# Patient Record
Sex: Female | Born: 1970
Health system: Southern US, Community
[De-identification: ages and names within clinical notes are randomized; demographics above are authoritative.]

## PROBLEM LIST (undated history)

## (undated) DIAGNOSIS — Z9981 Dependence on supplemental oxygen: Secondary | ICD-10-CM

## (undated) DIAGNOSIS — R160 Hepatomegaly, not elsewhere classified: Secondary | ICD-10-CM

## (undated) DIAGNOSIS — G4733 Obstructive sleep apnea (adult) (pediatric): Secondary | ICD-10-CM

## (undated) DIAGNOSIS — I10 Essential (primary) hypertension: Secondary | ICD-10-CM

## (undated) DIAGNOSIS — G43909 Migraine, unspecified, not intractable, without status migrainosus: Secondary | ICD-10-CM

## (undated) DIAGNOSIS — J189 Pneumonia, unspecified organism: Secondary | ICD-10-CM

## (undated) DIAGNOSIS — K219 Gastro-esophageal reflux disease without esophagitis: Secondary | ICD-10-CM

## (undated) DIAGNOSIS — L309 Dermatitis, unspecified: Secondary | ICD-10-CM

## (undated) DIAGNOSIS — G510 Bell's palsy: Secondary | ICD-10-CM

## (undated) DIAGNOSIS — Z8041 Family history of malignant neoplasm of ovary: Secondary | ICD-10-CM

## (undated) DIAGNOSIS — Q513 Bicornate uterus: Secondary | ICD-10-CM

## (undated) DIAGNOSIS — Z803 Family history of malignant neoplasm of breast: Secondary | ICD-10-CM

## (undated) DIAGNOSIS — E669 Obesity, unspecified: Secondary | ICD-10-CM

## (undated) DIAGNOSIS — F419 Anxiety disorder, unspecified: Secondary | ICD-10-CM

## (undated) DIAGNOSIS — M3500 Sicca syndrome, unspecified: Secondary | ICD-10-CM

## (undated) DIAGNOSIS — M329 Systemic lupus erythematosus, unspecified: Secondary | ICD-10-CM

## (undated) DIAGNOSIS — IMO0002 Reserved for concepts with insufficient information to code with codable children: Secondary | ICD-10-CM

## (undated) DIAGNOSIS — N179 Acute kidney failure, unspecified: Secondary | ICD-10-CM

## (undated) DIAGNOSIS — I209 Angina pectoris, unspecified: Secondary | ICD-10-CM

## (undated) DIAGNOSIS — Z8042 Family history of malignant neoplasm of prostate: Secondary | ICD-10-CM

## (undated) DIAGNOSIS — R079 Chest pain, unspecified: Secondary | ICD-10-CM

## (undated) DIAGNOSIS — E1143 Type 2 diabetes mellitus with diabetic autonomic (poly)neuropathy: Secondary | ICD-10-CM

## (undated) DIAGNOSIS — E7800 Pure hypercholesterolemia, unspecified: Secondary | ICD-10-CM

## (undated) DIAGNOSIS — I509 Heart failure, unspecified: Secondary | ICD-10-CM

## (undated) DIAGNOSIS — I639 Cerebral infarction, unspecified: Secondary | ICD-10-CM

## (undated) DIAGNOSIS — K3184 Gastroparesis: Secondary | ICD-10-CM

## (undated) DIAGNOSIS — Z9989 Dependence on other enabling machines and devices: Secondary | ICD-10-CM

## (undated) HISTORY — DX: Bell's palsy: G51.0

## (undated) HISTORY — DX: Sicca syndrome, unspecified: M35.00

## (undated) HISTORY — DX: Family history of malignant neoplasm of breast: Z80.3

## (undated) HISTORY — DX: Family history of malignant neoplasm of ovary: Z80.41

## (undated) HISTORY — PX: MUSCLE BIOPSY: SHX716

## (undated) HISTORY — PX: UMBILICAL HERNIA REPAIR: SHX196

## (undated) HISTORY — PX: HERNIA REPAIR: SHX51

## (undated) HISTORY — DX: Family history of malignant neoplasm of prostate: Z80.42

## (undated) HISTORY — DX: Dermatitis, unspecified: L30.9

---

## 1997-08-01 HISTORY — PX: ECTOPIC PREGNANCY SURGERY: SHX613

## 2003-02-24 ENCOUNTER — Encounter: Payer: Self-pay | Admitting: Cardiology

## 2003-02-24 ENCOUNTER — Encounter: Admission: RE | Admit: 2003-02-24 | Discharge: 2003-02-24 | Payer: Self-pay | Admitting: Cardiology

## 2005-07-18 ENCOUNTER — Emergency Department (HOSPITAL_COMMUNITY): Admission: EM | Admit: 2005-07-18 | Discharge: 2005-07-18 | Payer: Self-pay | Admitting: Emergency Medicine

## 2005-07-21 ENCOUNTER — Ambulatory Visit (HOSPITAL_COMMUNITY): Admission: RE | Admit: 2005-07-21 | Discharge: 2005-07-21 | Payer: Self-pay | Admitting: Obstetrics & Gynecology

## 2005-08-01 HISTORY — PX: BREAST CYST EXCISION: SHX579

## 2005-08-31 ENCOUNTER — Ambulatory Visit (HOSPITAL_BASED_OUTPATIENT_CLINIC_OR_DEPARTMENT_OTHER): Admission: RE | Admit: 2005-08-31 | Discharge: 2005-08-31 | Payer: Self-pay | Admitting: General Surgery

## 2005-08-31 ENCOUNTER — Encounter (INDEPENDENT_AMBULATORY_CARE_PROVIDER_SITE_OTHER): Payer: Self-pay | Admitting: Specialist

## 2007-05-25 ENCOUNTER — Inpatient Hospital Stay (HOSPITAL_COMMUNITY): Admission: EM | Admit: 2007-05-25 | Discharge: 2007-05-28 | Payer: Self-pay | Admitting: Emergency Medicine

## 2008-05-19 ENCOUNTER — Emergency Department (HOSPITAL_COMMUNITY): Admission: EM | Admit: 2008-05-19 | Discharge: 2008-05-19 | Payer: Self-pay | Admitting: Family Medicine

## 2008-11-10 ENCOUNTER — Inpatient Hospital Stay (HOSPITAL_COMMUNITY): Admission: EM | Admit: 2008-11-10 | Discharge: 2008-11-13 | Payer: Self-pay | Admitting: Emergency Medicine

## 2008-11-10 ENCOUNTER — Encounter (INDEPENDENT_AMBULATORY_CARE_PROVIDER_SITE_OTHER): Payer: Self-pay | Admitting: Internal Medicine

## 2008-11-11 ENCOUNTER — Ambulatory Visit: Payer: Self-pay | Admitting: Vascular Surgery

## 2008-11-11 ENCOUNTER — Encounter (INDEPENDENT_AMBULATORY_CARE_PROVIDER_SITE_OTHER): Payer: Self-pay | Admitting: Internal Medicine

## 2009-01-23 ENCOUNTER — Emergency Department (HOSPITAL_COMMUNITY): Admission: EM | Admit: 2009-01-23 | Discharge: 2009-01-23 | Payer: Self-pay | Admitting: Emergency Medicine

## 2009-08-01 DIAGNOSIS — G4733 Obstructive sleep apnea (adult) (pediatric): Secondary | ICD-10-CM

## 2009-08-01 HISTORY — DX: Obstructive sleep apnea (adult) (pediatric): G47.33

## 2009-11-19 ENCOUNTER — Encounter: Admission: RE | Admit: 2009-11-19 | Discharge: 2009-11-19 | Payer: Self-pay | Admitting: Internal Medicine

## 2010-02-02 ENCOUNTER — Encounter: Admission: RE | Admit: 2010-02-02 | Discharge: 2010-02-02 | Payer: Self-pay | Admitting: Internal Medicine

## 2010-03-24 ENCOUNTER — Encounter: Admission: RE | Admit: 2010-03-24 | Discharge: 2010-06-22 | Payer: Self-pay | Admitting: Internal Medicine

## 2010-06-30 ENCOUNTER — Emergency Department (HOSPITAL_COMMUNITY)
Admission: EM | Admit: 2010-06-30 | Discharge: 2010-06-30 | Payer: Self-pay | Source: Home / Self Care | Admitting: Emergency Medicine

## 2010-07-20 ENCOUNTER — Encounter
Admission: RE | Admit: 2010-07-20 | Discharge: 2010-08-31 | Payer: Self-pay | Source: Home / Self Care | Attending: Internal Medicine | Admitting: Internal Medicine

## 2010-07-27 ENCOUNTER — Emergency Department (HOSPITAL_COMMUNITY)
Admission: EM | Admit: 2010-07-27 | Discharge: 2010-07-27 | Payer: Self-pay | Source: Home / Self Care | Admitting: Emergency Medicine

## 2010-08-09 ENCOUNTER — Ambulatory Visit
Admission: RE | Admit: 2010-08-09 | Discharge: 2010-08-09 | Payer: Self-pay | Source: Home / Self Care | Attending: Endocrinology | Admitting: Endocrinology

## 2010-08-09 DIAGNOSIS — M3500 Sicca syndrome, unspecified: Secondary | ICD-10-CM | POA: Insufficient documentation

## 2010-08-09 DIAGNOSIS — M329 Systemic lupus erythematosus, unspecified: Secondary | ICD-10-CM | POA: Insufficient documentation

## 2010-08-09 DIAGNOSIS — G510 Bell's palsy: Secondary | ICD-10-CM | POA: Insufficient documentation

## 2010-08-09 HISTORY — DX: Sjogren syndrome, unspecified: M35.00

## 2010-08-09 HISTORY — DX: Bell's palsy: G51.0

## 2010-09-02 ENCOUNTER — Ambulatory Visit: Admit: 2010-09-02 | Payer: Self-pay | Admitting: Endocrinology

## 2010-09-02 ENCOUNTER — Encounter: Payer: Self-pay | Admitting: Endocrinology

## 2010-09-02 ENCOUNTER — Ambulatory Visit (INDEPENDENT_AMBULATORY_CARE_PROVIDER_SITE_OTHER): Payer: Medicaid Other | Admitting: Endocrinology

## 2010-09-02 DIAGNOSIS — E109 Type 1 diabetes mellitus without complications: Secondary | ICD-10-CM

## 2010-09-02 NOTE — Assessment & Plan Note (Signed)
Summary: NEW ENDO CONSULT/ UNCONT. GLOUCOSE/[facility] ACCESS/   Vital Signs:  Patient profile:   40 year old female Height:      68 inches (172.72 cm) Weight:      366 pounds (166.36 kg) BMI:     55.85 O2 Sat:      95 % on Room air Temp:     98.5 degrees F (36.94 degrees C) oral Pulse rate:   89 / minute Pulse rhythm:   regular BP sitting:   122 / 72  (left arm) Cuff size:   regular  Vitals Entered By: Brenton Grills CMA Duncan Dull) (August 09, 2010 10:58 AM)  O2 Flow:  Room air CC: New Endo Consult/Uncontrolled Diabetes/Dr. Claretha Cooper Jenkins/aj Is Patient Diabetic? Yes   Referring Provider:  Della Goo MD Primary Provider:  Della Goo MD  CC:  New Endo Consult/Uncontrolled Diabetes/Dr. Claretha Cooper Jenkins/aj.  History of Present Illness: pt states 18 months h/o dm.  she is unaware of any chronic complications.  she has been on insulin a approx 1 year.  she takes prednisone for sle, and this was felt to contribute to her dm.   she takes lantus, and prn novolog (she averages 5-10 units of novolog total per day). she brings a record of her cbg's which i have reviewed today.  it varies from 150-400.  it is in general higher later in the day. pt says her diet and exercise are "good."   symptomatically, pt states 18 mos of moderate dryness of the mouth, and associated fatigue  Current Medications (verified): 1)  Metformin Hcl 500 Mg Tabs (Metformin Hcl) .Marland Kitchen.. 1 Tablet By Mouth Two Times A Day 2)  Klor-Con 10 10 Meq Cr-Tabs (Potassium Chloride) .Marland Kitchen.. 1 Tablet By Mouth Once Daily 3)  Alendronate Sodium 70 Mg Tabs (Alendronate Sodium) .Marland Kitchen.. 1 Tablet By Mouth Every Week 4)  Prednisone 10 Mg Tabs (Prednisone) .Marland Kitchen.. 1 1/2 Tablets By Mouth Once Daily (15mg ) 5)  Azathioprine 50 Mg Tabs (Azathioprine) .... 2 Tablets By Mouth Two Times A Day 6)  Hydroxychloroquine Sulfate 200 Mg Tabs (Hydroxychloroquine Sulfate) .Marland Kitchen.. 1 Tablet By Mouth Once Daily 7)  Losartan Potassium 100 Mg Tabs (Losartan  Potassium) .Marland Kitchen.. 1 Tablet By Mouth Once Daily 8)  Furosemide 40 Mg Tabs (Furosemide) .Marland Kitchen.. 1 Tablet By Mouth Every Morning 9)  Atenolol 100 Mg Tabs (Atenolol) .Marland Kitchen.. 1 Tablet By Mouth At Bedtime 10)  Pravastatin Sodium 10 Mg Tabs (Pravastatin Sodium) .Marland Kitchen.. 1 Tablet By Mouth At Bedtime 11)  Lantus 100 Unit/ml Soln (Insulin Glargine) .... 40 Units At Bedtime 12)  Novolog Flexpen 100 Unit/ml Soln (Insulin Aspart) .... Slidling Scale  Allergies (verified): 1)  ! Percocet (Oxycodone-Acetaminophen)  Past History:  Past Medical History: SJOGREN'S SYNDROME (ICD-710.2) BELL'S PALSY (ICD-351.0) SLE (ICD-710.0) IDDM (ICD-250.01) FAMILY HISTORY DIABETES 1ST DEGREE RELATIVE (ICD-V18.0)  Family History: Reviewed history and no changes required. Family History of Arthritis Family History Diabetes: mother Family History Hypertension Family History of Stroke 1st degree relative  Social History: Reviewed history and no changes required. Single Former Smoker Alcohol use-no Drug use-no Regular exercise-yes student Smoking Status:  quit Drug Use:  no Does Patient Exercise:  yes Seat Belt Use:  yes  Review of Systems       no recent hypoglycemia.  she has irreg menses. denies weight change, blurry vision, chest pain, n/v, excessive diaphoresis, rhinorrhea, and easy bruising.  she has ocasional headaches, polyuria, leg cramps, depression, mild difficulty with concentration, and doe.   Physical Exam  General:  morbidly obese.  no distress  Head:  head: no deformity.  there is mild hirsutism on the face eyes: no periorbital swelling, no proptosis external nose and ears are normal mouth: no lesion seen Eyes:  there is bilat proptosis, right > left Neck:  Supple without thyroid enlargement or tenderness.  Lungs:  Clear to auscultation bilaterally. Normal respiratory effort.  Heart:  Regular rate and rhythm without murmurs or gallops noted. Normal S1,S2.   Msk:  muscle bulk and strength are  grossly normal.  no obvious joint swelling.  gait is normal and steady  Pulses:  dorsalis pedis intact bilat.  no carotid bruit  Extremities:  no deformity.  no ulcer on the feet.  feet are of normal color and temp.  no edema mycotic toenails.   Neurologic:  cn 2-12 grossly intact.   readily moves all 4's.   sensation is intact to touch on the feet  Skin:  normal texture and temp.  no rash.  not diaphoretic  Cervical Nodes:  No significant adenopathy.  Psych:  Alert and cooperative; normal mood and affect; normal attention span and concentration.     Impression & Recommendations:  Problem # 1:  IDDM (ICD-250.01) she needs some adjustment in her therapy  Problem # 2:  SLE (ICD-710.0) this raises the probability that she has type 1 dm  Problem # 3:  depression this could hamper the rx of #1, but seems to be mild  Medications Added to Medication List This Visit: 1)  Metformin Hcl 500 Mg Tabs (Metformin hcl) .Marland Kitchen.. 1 tablet by mouth two times a day 2)  Klor-con 10 10 Meq Cr-tabs (Potassium chloride) .Marland Kitchen.. 1 tablet by mouth once daily 3)  Alendronate Sodium 70 Mg Tabs (Alendronate sodium) .Marland Kitchen.. 1 tablet by mouth every week 4)  Prednisone 10 Mg Tabs (Prednisone) .Marland Kitchen.. 1 1/2 tablets by mouth once daily (15mg ) 5)  Azathioprine 50 Mg Tabs (Azathioprine) .... 2 tablets by mouth two times a day 6)  Hydroxychloroquine Sulfate 200 Mg Tabs (Hydroxychloroquine sulfate) .Marland Kitchen.. 1 tablet by mouth once daily 7)  Losartan Potassium 100 Mg Tabs (Losartan potassium) .Marland Kitchen.. 1 tablet by mouth once daily 8)  Furosemide 40 Mg Tabs (Furosemide) .Marland Kitchen.. 1 tablet by mouth every morning 9)  Atenolol 100 Mg Tabs (Atenolol) .Marland Kitchen.. 1 tablet by mouth at bedtime 10)  Pravastatin Sodium 10 Mg Tabs (Pravastatin sodium) .Marland Kitchen.. 1 tablet by mouth at bedtime 11)  Lantus 100 Unit/ml Soln (Insulin glargine) .... 40 units at bedtime 12)  Lantus 100 Unit/ml Soln (Insulin glargine) .... 35 units at bedtime 13)  Novolog Flexpen 100 Unit/ml  Soln (Insulin aspart) .Marland Kitchen.. 10 units three times a day (just before each meal) 14)  Novolog Flexpen 100 Unit/ml Soln (Insulin aspart) .... Slidling scale  Other Orders: New Patient Level IV (47829)  Patient Instructions: 1)  good diet and exercise habits significanly improve the control of your diabetes.  please let me know if you wish to be referred to a dietician.  high blood sugar is very risky to your health.  you should see an eye doctor every year. 2)  controlling your blood pressure and cholesterol drastically reduces the damage diabetes does to your body.  this also applies to quitting smoking.  please discuss these with your doctor.  you should take an aspirin every day, unless you have been advised by a doctor not to. 3)  check your blood sugar 2 times a day.  vary the time of day when you check,  between before the 3 meals, and at bedtime.  also check if you have symptoms of your blood sugar being too high or too low.  please keep a record of the readings and bring it to your next appointment here.  please call us sooner if you are having low blood sugar episodes. 4)  for now, take novolog, no matter what your blood sugar is: 10 units three times a day (just before each meal) 5)  reduce lantus to 35 units at bedtime 6)  Please schedule a follow-up appointment in 2 weeks. 7)  cc lisa j-moore, [place]   Orders Added: 1)  New Patient Level IV [16109]

## 2010-09-07 ENCOUNTER — Encounter: Payer: Medicaid Other | Attending: Internal Medicine | Admitting: *Deleted

## 2010-09-07 DIAGNOSIS — Z713 Dietary counseling and surveillance: Secondary | ICD-10-CM | POA: Insufficient documentation

## 2010-09-08 NOTE — Assessment & Plan Note (Signed)
Summary: 2w f/u  cd   Vital Signs:  Patient profile:   40 year old female Height:      68 inches (172.72 cm) Weight:      360 pounds (163.64 kg) BMI:     54.94 O2 Sat:      96 % on Room air Temp:     98.5 degrees F (36.94 degrees C) oral Pulse rate:   87 / minute Pulse rhythm:   regular BP sitting:   128 / 86  (left arm) Cuff size:   regular  Vitals Entered By: Brenton Grills CMA Duncan Dull) (September 02, 2010 8:05 AM)  O2 Flow:  Room air CC: 2 week F/U/aj Is Patient Diabetic? Yes   Referring Provider:  Della Goo MD Primary Provider:  Della Goo MD  CC:  2 week F/U/aj.  History of Present Illness: pt states she feels well in general.  she brings a record of her cbg's which i have reviewed today.  it is highest at hs (300's), and 200's at all other times cbg's are still over 200.  it is in general higher as the day goes on.    Current Medications (verified): 1)  Metformin Hcl 500 Mg Tabs (Metformin Hcl) .Marland Kitchen.. 1 Tablet By Mouth Two Times A Day 2)  Klor-Con 10 10 Meq Cr-Tabs (Potassium Chloride) .Marland Kitchen.. 1 Tablet By Mouth Once Daily 3)  Alendronate Sodium 70 Mg Tabs (Alendronate Sodium) .Marland Kitchen.. 1 Tablet By Mouth Every Week 4)  Prednisone 10 Mg Tabs (Prednisone) .Marland Kitchen.. 1 1/2 Tablets By Mouth Once Daily (15mg ) 5)  Azathioprine 50 Mg Tabs (Azathioprine) .... 2 Tablets By Mouth Two Times A Day 6)  Hydroxychloroquine Sulfate 200 Mg Tabs (Hydroxychloroquine Sulfate) .Marland Kitchen.. 1 Tablet By Mouth Once Daily 7)  Losartan Potassium 100 Mg Tabs (Losartan Potassium) .Marland Kitchen.. 1 Tablet By Mouth Once Daily 8)  Furosemide 40 Mg Tabs (Furosemide) .Marland Kitchen.. 1 Tablet By Mouth Every Morning 9)  Atenolol 100 Mg Tabs (Atenolol) .Marland Kitchen.. 1 Tablet By Mouth At Bedtime 10)  Pravastatin Sodium 10 Mg Tabs (Pravastatin Sodium) .Marland Kitchen.. 1 Tablet By Mouth At Bedtime 11)  Lantus 100 Unit/ml Soln (Insulin Glargine) .... 35 Units At Bedtime 12)  Novolog Flexpen 100 Unit/ml Soln (Insulin Aspart) .Marland Kitchen.. 10 Units Three Times A Day (Just  Before Each Meal)  Allergies (verified): 1)  ! Percocet (Oxycodone-Acetaminophen)  Past History:  Past Medical History: Last updated: 08/09/2010 SJOGREN'S SYNDROME (ICD-710.2) BELL'S PALSY (ICD-351.0) SLE (ICD-710.0) IDDM (ICD-250.01) FAMILY HISTORY DIABETES 1ST DEGREE RELATIVE (ICD-V18.0)  Review of Systems  The patient denies hypoglycemia.    Physical Exam  General:  morbidly obese.  no distress  Skin:  injection sites at triceps areas are without lesions.     Impression & Recommendations:  Problem # 1:  IDDM (ICD-250.01) needs increased rx  Medications Added to Medication List This Visit: 1)  Lantus 100 Unit/ml Soln (Insulin glargine) .... 25 units at bedtime 2)  Novolog Flexpen 100 Unit/ml Soln (Insulin aspart) .... Three times a day (just before each meal) 15-15-30 units  Other Orders: Est. Patient Level III (16109)  Patient Instructions: 1)  check your blood sugar 2 times a day.  vary the time of day when you check, between before the 3 meals, and at bedtime.  also check if you have symptoms of your blood sugar being too high or too low.  please keep a record of the readings and bring it to your next appointment here.  please call us sooner if you are  having low blood sugar episodes. 2)  increase novolog to three times a day (just before each meal) 15-15-30 units. 3)  reduce lantus to 25 units at bedtime. 4)  Please schedule a follow-up appointment in 2 weeks. Prescriptions: NOVOLOG FLEXPEN 100 UNIT/ML SOLN (INSULIN ASPART) three times a day (just before each meal) 15-15-30 units  #2 boxes x 11   Entered and Authorized by:   Minus Breeding MD   Signed by:   Minus Breeding MD on 09/02/2010   Method used:   Faxed to ...       [facility] (mail-order)       [address]       [place], [place]  [address]       Ph: [phone]       Fax: [phone]   RxID:   [id number]    Orders Added: 1)  Est. Patient Level III [40102]

## 2010-09-16 ENCOUNTER — Ambulatory Visit (INDEPENDENT_AMBULATORY_CARE_PROVIDER_SITE_OTHER): Payer: Medicaid Other | Admitting: Endocrinology

## 2010-09-16 ENCOUNTER — Encounter: Payer: Self-pay | Admitting: Endocrinology

## 2010-09-16 DIAGNOSIS — E109 Type 1 diabetes mellitus without complications: Secondary | ICD-10-CM

## 2010-09-22 NOTE — Assessment & Plan Note (Signed)
Summary: 2 WK FU  STC   Vital Signs:  Patient profile:   40 year old female Height:      68 inches (172.72 cm) Weight:      362.25 pounds (164.66 kg) BMI:     55.28 O2 Sat:      97 % on Room air Temp:     98.7 degrees F (37.06 degrees C) oral Pulse rate:   86 / minute Pulse rhythm:   regular BP sitting:   124 / 72  (left arm) Cuff size:   regular  Vitals Entered By: Brenton Grills CMA Duncan Dull) (September 16, 2010 8:17 AM)  O2 Flow:  Room air CC: 2 week F/U/aj Is Patient Diabetic? Yes   Referring Provider:  Della Goo MD Primary Provider:  Della Goo MD  CC:  2 week F/U/aj.  History of Present Illness: pt states she feels well in general.  she brings a record of her cbg's which i have reviewed today. it varies from 150 (breakfast and lunch) to 200's (supper and hs).  no weight change.    Current Medications (verified): 1)  Metformin Hcl 500 Mg Tabs (Metformin Hcl) .Marland Kitchen.. 1 Tablet By Mouth Two Times A Day 2)  Klor-Con 10 10 Meq Cr-Tabs (Potassium Chloride) .Marland Kitchen.. 1 Tablet By Mouth Once Daily 3)  Alendronate Sodium 70 Mg Tabs (Alendronate Sodium) .Marland Kitchen.. 1 Tablet By Mouth Every Week 4)  Prednisone 10 Mg Tabs (Prednisone) .Marland Kitchen.. 1 1/2 Tablets By Mouth Once Daily (15mg ) 5)  Azathioprine 50 Mg Tabs (Azathioprine) .... 2 Tablets By Mouth Two Times A Day 6)  Hydroxychloroquine Sulfate 200 Mg Tabs (Hydroxychloroquine Sulfate) .Marland Kitchen.. 1 Tablet By Mouth Once Daily 7)  Losartan Potassium 100 Mg Tabs (Losartan Potassium) .Marland Kitchen.. 1 Tablet By Mouth Once Daily 8)  Furosemide 40 Mg Tabs (Furosemide) .Marland Kitchen.. 1 Tablet By Mouth Every Morning 9)  Atenolol 100 Mg Tabs (Atenolol) .Marland Kitchen.. 1 Tablet By Mouth At Bedtime 10)  Pravastatin Sodium 10 Mg Tabs (Pravastatin Sodium) .Marland Kitchen.. 1 Tablet By Mouth At Bedtime 11)  Lantus 100 Unit/ml Soln (Insulin Glargine) .... 25 Units At Bedtime 12)  Novolog Flexpen 100 Unit/ml Soln (Insulin Aspart) .... Three Times A Day (Just Before Each Meal) 15-15-30 Units  Allergies  (verified): 1)  ! Percocet (Oxycodone-Acetaminophen)  Past History:  Past Medical History: Last updated: 08/09/2010 SJOGREN'S SYNDROME (ICD-710.2) BELL'S PALSY (ICD-351.0) SLE (ICD-710.0) IDDM (ICD-250.01) FAMILY HISTORY DIABETES 1ST DEGREE RELATIVE (ICD-V18.0)  Review of Systems  The patient denies hypoglycemia.    Physical Exam  General:  morbidly obese.  no distress  Psych:  Alert and cooperative; normal mood and affect; normal attention span and concentration.     Impression & Recommendations:  Problem # 1:  IDDM (ICD-250.01) the pattern of her cbg's indicates she needs more novolog, and less lantus  Medications Added to Medication List This Visit: 1)  Prednisone 5 Mg Tabs (Prednisone) .... 2 1/2 tabs each am  Other Orders: Est. Patient Level III (44010)  Patient Instructions: 1)  check your blood sugar 2 times a day.  vary the time of day when you check, between before the 3 meals, and at bedtime.  also check if you have symptoms of your blood sugar being too high or too low.  please keep a record of the readings and bring it to your next appointment here.  please call us sooner if you are having low blood sugar episodes. 2)  increase novolog to three times a day (just before each meal) 15-25-40  units. 3)  reduce lantus to 15 units at bedtime. 4)  Please schedule a follow-up appointment in 1 month.   Orders Added: 1)  Est. Patient Level III [16109]

## 2010-09-28 ENCOUNTER — Encounter: Payer: Medicaid Other | Attending: Internal Medicine | Admitting: Dietician

## 2010-09-28 DIAGNOSIS — Z713 Dietary counseling and surveillance: Secondary | ICD-10-CM | POA: Insufficient documentation

## 2010-10-11 LAB — DIFFERENTIAL
Basophils Absolute: 0 10*3/uL (ref 0.0–0.1)
Basophils Relative: 0 % (ref 0–1)
Eosinophils Absolute: 0 10*3/uL (ref 0.0–0.7)
Eosinophils Relative: 0 % (ref 0–5)
Lymphocytes Relative: 12 % (ref 12–46)
Lymphs Abs: 1.4 10*3/uL (ref 0.7–4.0)
Monocytes Absolute: 0.4 10*3/uL (ref 0.1–1.0)
Monocytes Relative: 4 % (ref 3–12)
Neutro Abs: 9.7 10*3/uL — ABNORMAL HIGH (ref 1.7–7.7)
Neutrophils Relative %: 84 % — ABNORMAL HIGH (ref 43–77)

## 2010-10-11 LAB — URINALYSIS, ROUTINE W REFLEX MICROSCOPIC
Bilirubin Urine: NEGATIVE
Glucose, UA: 100 mg/dL — AB
Ketones, ur: NEGATIVE mg/dL
Nitrite: NEGATIVE
Protein, ur: NEGATIVE mg/dL
Specific Gravity, Urine: 1.014 (ref 1.005–1.030)
Urobilinogen, UA: 0.2 mg/dL (ref 0.0–1.0)
pH: 6 (ref 5.0–8.0)

## 2010-10-11 LAB — CBC
HCT: 38.5 % (ref 36.0–46.0)
Hemoglobin: 12.2 g/dL (ref 12.0–15.0)
MCH: 28.2 pg (ref 26.0–34.0)
MCHC: 31.7 g/dL (ref 30.0–36.0)
MCV: 89.1 fL (ref 78.0–100.0)
Platelets: 232 10*3/uL (ref 150–400)
RBC: 4.32 MIL/uL (ref 3.87–5.11)
RDW: 13.1 % (ref 11.5–15.5)
WBC: 11.6 10*3/uL — ABNORMAL HIGH (ref 4.0–10.5)

## 2010-10-11 LAB — URINE MICROSCOPIC-ADD ON

## 2010-10-11 LAB — TYPE AND SCREEN: ABO/RH(D): O POS

## 2010-10-11 LAB — BASIC METABOLIC PANEL
BUN: 11 mg/dL (ref 6–23)
CO2: 26 mEq/L (ref 19–32)
Calcium: 9.1 mg/dL (ref 8.4–10.5)
Chloride: 106 mEq/L (ref 96–112)
Creatinine, Ser: 0.92 mg/dL (ref 0.4–1.2)
GFR calc Af Amer: >60 mL/min (ref >60)
GFR calc non Af Amer: >60 mL/min (ref >60)
Glucose, Bld: 272 mg/dL — ABNORMAL HIGH (ref 70–99)
Potassium: 4.2 mEq/L (ref 3.5–5.1)
Sodium: 139 mEq/L (ref 135–145)

## 2010-10-11 LAB — ABO/RH: ABO/RH(D): O POS

## 2010-10-11 LAB — PROTIME-INR
INR: 0.99 (ref 0.00–1.49)
Prothrombin Time: 13.3 seconds (ref 11.6–15.2)

## 2010-10-11 LAB — OCCULT BLOOD, POC DEVICE: Fecal Occult Bld: POSITIVE

## 2010-10-11 LAB — URINE CULTURE: Culture  Setup Time: 201112280450

## 2010-10-11 LAB — APTT: aPTT: 29 seconds (ref 24–37)

## 2010-10-21 ENCOUNTER — Ambulatory Visit (INDEPENDENT_AMBULATORY_CARE_PROVIDER_SITE_OTHER): Payer: Medicaid Other | Admitting: Endocrinology

## 2010-10-21 ENCOUNTER — Encounter: Payer: Self-pay | Admitting: Endocrinology

## 2010-10-21 DIAGNOSIS — E109 Type 1 diabetes mellitus without complications: Secondary | ICD-10-CM

## 2010-10-21 NOTE — Patient Instructions (Addendum)
increase novolog to three times a day (just before each meal) 20-30-40 units. reduce lantus to 10 units at bedtime. Please schedule a follow-up appointment in 1 month.

## 2010-10-21 NOTE — Progress Notes (Signed)
  Subjective:    Patient ID: [name], female    DOB: September 08, 1970, 40 y.o.   MRN: [id number]  HPI pt states she feels well in general.  she brings a record of her cbg's which i have reviewed today.  It varies from 100-300. There is little or no trend throughout the day, except it is lower in am than at hs.   Past Medical History  Diagnosis Date  . IDDM 08/09/2010  . Bell's palsy 08/09/2010  . SLE 08/09/2010  . SJOGREN'S SYNDROME 08/09/2010   No past surgical history on file.  reports that she quit smoking about 30 years ago. She does not have any smokeless tobacco history on file. She reports that she does not drink alcohol or use illicit drugs. family history includes Arthritis in her other; Diabetes in her mother; Hypertension in her other; and Stroke in her other. Allergies  Allergen Reactions  . Oxycodone-Acetaminophen      Review of Systems Denies hypoglycemia    Objective:   Physical Exam Gen: no distress.  Morbidly obese. Ex: trace bilat ankle edema       Assessment & Plan:  Dm, needs increased rx

## 2010-11-01 ENCOUNTER — Emergency Department (HOSPITAL_COMMUNITY)
Admission: EM | Admit: 2010-11-01 | Discharge: 2010-11-01 | Disposition: A | Discharge status: Home / Self Care | Payer: Medicaid Other | Attending: Emergency Medicine | Admitting: Emergency Medicine

## 2010-11-01 DIAGNOSIS — M436 Torticollis: Secondary | ICD-10-CM | POA: Insufficient documentation

## 2010-11-01 DIAGNOSIS — Z8673 Personal history of transient ischemic attack (TIA), and cerebral infarction without residual deficits: Secondary | ICD-10-CM | POA: Insufficient documentation

## 2010-11-01 DIAGNOSIS — I10 Essential (primary) hypertension: Secondary | ICD-10-CM | POA: Insufficient documentation

## 2010-11-01 DIAGNOSIS — M329 Systemic lupus erythematosus, unspecified: Secondary | ICD-10-CM | POA: Insufficient documentation

## 2010-11-10 LAB — CK TOTAL AND CKMB (NOT AT ARMC)
CK, MB: 1.8 ng/mL (ref 0.3–4.0)
Relative Index: 0.4 (ref 0.0–2.5)
Total CK: 472 U/L — ABNORMAL HIGH (ref 7–177)

## 2010-11-10 LAB — BASIC METABOLIC PANEL
BUN: 8 mg/dL (ref 6–23)
BUN: 8 mg/dL (ref 6–23)
Calcium: 9 mg/dL (ref 8.4–10.5)
GFR calc non Af Amer: >60 mL/min (ref >60)
GFR calc non Af Amer: >60 mL/min (ref >60)
Potassium: 3.7 mEq/L (ref 3.5–5.1)
Potassium: 4 mEq/L (ref 3.5–5.1)
Sodium: 140 mEq/L (ref 135–145)

## 2010-11-10 LAB — GLUCOSE, CAPILLARY
Glucose-Capillary: 103 mg/dL — ABNORMAL HIGH (ref 70–99)
Glucose-Capillary: 105 mg/dL — ABNORMAL HIGH (ref 70–99)
Glucose-Capillary: 132 mg/dL — ABNORMAL HIGH (ref 70–99)
Glucose-Capillary: 133 mg/dL — ABNORMAL HIGH (ref 70–99)

## 2010-11-10 LAB — HEMOGLOBIN A1C: Hgb A1c MFr Bld: 6.4 % — ABNORMAL HIGH (ref 4.6–6.1)

## 2010-11-10 LAB — CBC
HCT: 37.4 % (ref 36.0–46.0)
HCT: 37.5 % (ref 36.0–46.0)
MCHC: 33.4 g/dL (ref 30.0–36.0)
Platelets: 249 10*3/uL (ref 150–400)
Platelets: 251 10*3/uL (ref 150–400)
RDW: 14.3 % (ref 11.5–15.5)
WBC: 9.2 10*3/uL (ref 4.0–10.5)

## 2010-11-10 LAB — CARDIAC PANEL(CRET KIN+CKTOT+MB+TROPI)
CK, MB: 1.5 ng/mL (ref 0.3–4.0)
Relative Index: 0.4 (ref 0.0–2.5)
Troponin I: <0.01 ng/mL (ref 0.00–0.06)

## 2010-11-10 LAB — HOMOCYSTEINE: Homocysteine: 9.7 umol/L (ref 4.0–15.4)

## 2010-11-10 LAB — DIFFERENTIAL
Eosinophils Relative: 1 % (ref 0–5)
Lymphocytes Relative: 26 % (ref 12–46)
Lymphs Abs: 2.4 10*3/uL (ref 0.7–4.0)
Neutro Abs: 6 10*3/uL (ref 1.7–7.7)
Neutrophils Relative %: 65 % (ref 43–77)

## 2010-11-10 LAB — SEDIMENTATION RATE: Sed Rate: 30 mm/hr — ABNORMAL HIGH (ref 0–22)

## 2010-11-10 LAB — C3 COMPLEMENT: C3 Complement: 195 mg/dL (ref 88–201)

## 2010-11-10 LAB — TROPONIN I: Troponin I: 0.01 ng/mL (ref 0.00–0.06)

## 2010-11-10 LAB — TSH: TSH: 1.697 u[IU]/mL (ref 0.350–4.500)

## 2010-11-10 LAB — B. BURGDORFI ANTIBODIES: B burgdorferi Ab IgG+IgM: 0.12 {ISR}

## 2010-11-25 ENCOUNTER — Encounter: Payer: Self-pay | Admitting: Endocrinology

## 2010-11-25 ENCOUNTER — Ambulatory Visit (INDEPENDENT_AMBULATORY_CARE_PROVIDER_SITE_OTHER): Payer: Medicaid Other | Admitting: Endocrinology

## 2010-11-25 ENCOUNTER — Other Ambulatory Visit (INDEPENDENT_AMBULATORY_CARE_PROVIDER_SITE_OTHER): Payer: Medicaid Other

## 2010-11-25 ENCOUNTER — Encounter: Payer: Medicaid Other | Admitting: *Deleted

## 2010-11-25 VITALS — BP 124/82 | HR 93 | Temp 98.3°F | Ht 68.0 in | Wt 372.0 lb

## 2010-11-25 DIAGNOSIS — E119 Type 2 diabetes mellitus without complications: Secondary | ICD-10-CM

## 2010-11-25 DIAGNOSIS — E109 Type 1 diabetes mellitus without complications: Secondary | ICD-10-CM

## 2010-11-25 DIAGNOSIS — Z713 Dietary counseling and surveillance: Secondary | ICD-10-CM | POA: Insufficient documentation

## 2010-11-25 LAB — HEMOGLOBIN A1C: Hgb A1c MFr Bld: 8.3 % — ABNORMAL HIGH (ref 4.6–6.5)

## 2010-11-25 NOTE — Progress Notes (Signed)
  Subjective:    Patient ID: [name], female    DOB: Feb 28, 1971, 40 y.o.   MRN: [id number]  HPI no cbg record, but states cbg's are improved.  It is now seldom over 200.  It is highest before lunch, and lowest in am.  pt states she feels well in general. Past Medical History  Diagnosis Date  . IDDM 08/09/2010  . Bell's palsy 08/09/2010  . SLE 08/09/2010  . SJOGREN'S SYNDROME 08/09/2010   No past surgical history on file.  reports that she quit smoking about 30 years ago. She does not have any smokeless tobacco history on file. She reports that she does not drink alcohol or use illicit drugs. family history includes Arthritis in her other; Diabetes in her mother; Hypertension in her other; and Stroke in her other. Allergies  Allergen Reactions  . Oxycodone-Acetaminophen    Review of Systems denies hypoglycemia    Objective:   Physical Exam Pulses:  dorsalis pedis intact bilat.  no carotid bruit Extremities:  no deformity.  no ulcer on the feet.  feet are of normal color and temp.  no edema mycotic toenails.   Neurologic:  cn 2-12 grossly intact.   readily moves all 4's.   sensation is intact to touch on the feet    Lab Results  Component Value Date   HGBA1C 8.3* 11/25/2010      Assessment & Plan:  Dm, needs increased rx.  The pattern of her cbg's indicates the need for less lantus, and more humalog

## 2010-11-25 NOTE — Patient Instructions (Addendum)
blood tests are being ordered for you today.  please call [phone] to hear your test results. pending the test results, please increase novolog to three times a day (just before each meal) 25-30-45 units. stop lantus. Please make a follow-up appointment in 3 months (update: i left message on phone-tree:  rx as we discussed)

## 2010-11-29 ENCOUNTER — Encounter: Payer: Medicaid Other | Attending: Internal Medicine | Admitting: *Deleted

## 2010-11-29 DIAGNOSIS — Z713 Dietary counseling and surveillance: Secondary | ICD-10-CM | POA: Insufficient documentation

## 2010-12-14 NOTE — H&P (Signed)
[name], [name]                 ACCOUNT NO.:  [id number]   MEDICAL RECORD NO.:  [id number]          PATIENT TYPE:  EMS   LOCATION:  [facility]                         FACILITY:  [facility]   PHYSICIAN:  Beckey Rutter, MD  DATE OF BIRTH:  [birth year]   DATE OF ADMISSION:  05/25/2007  DATE OF DISCHARGE:                              HISTORY & PHYSICAL   PRIMARY CARE PHYSICIAN:  Della Goo, M.D.   CHIEF COMPLAINT:  Chest pain.   HISTORY OF PRESENT ILLNESS:  This is a 40 year old female with past  medical history significant for lupus and hypertension who presented  today with chills, cough and right-sided chest pain.  The patient stated  that she felt the chest pain mainly in the right side that is sharp in  nature and prevented her from taking a deep breath and 10/10 stand  sometimes with no radiation and no known aggravating or relieving  factors.  The patient denied similar pain like this before and denied  coronary artery disease or heart disease.  The patient admits to having  chills and feeling cold after her presentation in the emergency room  here.  The patient stated that she has a cough productive to yellowish  sputum and the subjective feeling of fever and chills.   PAST MEDICAL HISTORY:  1. Hypertension.  2. Lupus.   SOCIAL HISTORY:  No drug abuse, not a drinker, no smoking.   FAMILY HISTORY:  Significant for diabetes from the mother's side.   MEDICATION ALLERGIES:  PERCOCET as documented here without allergic  reaction specified.   MEDICATIONS:  1. Hydrocodone.  2. Acetaminophen.  3. Allegra.  4. Claritin-D 12.  5. Prevacid.   REVIEW OF SYSTEMS:  The patient admits to having chills and feeling very  cold. The rest of her review of systems is as per HPI.   PHYSICAL EXAMINATION:  Temperature 98.9, blood pressure 162/95, pulse  88, respiratory rate is 23.  GENERAL:  The patient was lying flat on bed not in acute respiratory  disease distress.  HEAD:   Atraumatic, normocephalic.  EYES:  PERRL. Mouth moist, no ulcer.  NECK:  Supple.  No JVD.  LUNG: Obese with distal breathing sounds.  The patient has area of  tenderness in the anterior right part of her chest. The patient is very  jumpy with even light touch of the stethoscope in that area.  PRECORDIUM:  Distal heart sounds.  ABDOMEN:  Obese, nontender.  EXTREMITIES:  No lower extremity edema.  NEUROLOGIC:  Alert and oriented x3, moving all her extremities  spontaneously.   Her EKG is showing T-wave inversion on the lateral leads starting from  V3, V4, V5 and V6. It is sinus rhythm with ventricular rate of 99 beats  per minutes.  No previous EKG to compare. Her x-ray is showing  bronchitic changes. Troponin is 0.01, CK-MB is 1.7 with a relative index  0.4.  Sodium 140, potassium 3.4, chloride 107, carbon dioxide 25,  glucose 104, BUN 5, creatinine 0.89, calcium 9, estimated GFR is 60.  White blood count 8.5, hemoglobin is 12.3, hematocrit  is 37.2 and  platelet count is 271.  D-dimer is 0.31.   ASSESSMENT AND PLAN:  This is a 40 year old female who came with chest  pain likely secondary to the bronchitic activity and the pain is  secondary to the musculoskeletal stretch secondary to cough but because  the patient has EKG abnormalities, I will admit the patient to rule out  acute coronary syndrome.  1. The patient will be admitted to telemetry floor for further      assessment and management.  2. The patient will be ruled out for acute coronary syndromes and      myocardial infarction by serial cardiac enzymes and serial EKGs.  3. For the bronchitic change cough which is productive, I will start      the patient on Avelox antibiotic and oxygen and cough suppressant.      I will also consider pain medication to relieve the musculoskeletal      change.  4. For the history of lupus, I will check the ESR and try to obtain      the records from Dr. Lovell Sheehan, her primary physician.  5.  For hypertension, we will monitor during this hospitalization.  6. For DVT prophylaxis, I will consider Lovenox and for GI prophylaxis      I will start Protonix.      Beckey Rutter, MD  Electronically Signed     EME/MEDQ  D:  05/25/2007  T:  05/26/2007  Job:  [id number]

## 2010-12-14 NOTE — Consult Note (Signed)
[name], [name]                 ACCOUNT NO.:  [id number]   MEDICAL RECORD NO.:  [id number]          PATIENT TYPE:  INP   LOCATION:  3029                         FACILITY:  [facility]   PHYSICIAN:  Marlan Palau, M.D.  DATE OF BIRTH:  22-Aug-1970   DATE OF CONSULTATION:  11/10/2008  DATE OF DISCHARGE:                                 CONSULTATION   HISTORY OF PRESENT ILLNESS:  [name] is a 40 year old right-handed  black female, born on 02/19/1971, with a history of lupus and  Sjogren syndrome and polymyositis and hypertension.  This patient  presents with onset of left-sided arm and leg weakness and tingling and  right facial droop that was first noticed around 3 a.m.  The patient was  last seen normal around 11 p.m. when she went to bed.  The patient  awakened with stiffness of the left arm and tingly sensations at 3 a.m.  The patient went back to sleep, woke up at 7 a.m. with persistent  symptoms.  The patient comes to the emergency room for an evaluation.  A  CAT scan of the brain was done and shows no acute changes.  There are  changes of sinusitis.  Neurology is asked to see this patient for  further evaluation.  The patient does report a headache over the last  week or so.  It is bifrontal and goes to the vertex of the head.  Headache has persisted.   PAST MEDICAL HISTORY:  Significant for:  1. New onset of left-sided weakness, numbness, and right facial droop.  2. Functional examination.  3. Obesity.  4. Tubal pregnancy.  5. Left breast cyst resection.  6. Lupus.  7. Hypertension.  8. Sjogren syndrome.  9. Polymyositis.   MEDICATIONS:  1. Prednisone 7 mg daily.  2. Atenolol 50 mg daily.  3. Diovan 160 mg daily.  4. Hydroxychloroquine 200 mg twice daily.  5. Vitamin D 50,000 units once a week.  6. Imuran 50 mg 3 daily.  7. Actonel 150 mg once a week.   The patient states an allergy to [facility].   Does not currently smoke or drink.  Denies use of illicit  drugs such as  cocaine or marijuana.   FAMILY MEDICAL HISTORY:  Notable that father died with aneurysm.  Mother  still living, has diabetes and hypertension.  Mother's siblings have  cancer.  The patient has 3 brothers and 4 sisters, all alive and well.   SOCIAL HISTORY:  The patient is single[place] is currently in school, has 1 child, it does not work.   REVIEW OF SYSTEMS:  Notable for no recent fevers.  The patient had some  chills in the last week.  The patient has had headache over the last  week.  The patient denies any double vision or loss of vision, has had  some blurred vision.  The patient does note some shortness of breath,  dyspnea on exertion, chest pain on exertion, some nausea, and has had  some recent troubles controlling the bladder.  Denies  any problems  controlling the bowels, has had left-sided weakness, some dizziness, and  numbness on the left side.   PHYSICAL EXAMINATION:  VITAL SIGNS:  Blood pressure is 140/84, heart  rate 94, respiratory rate 20, and temperature 99.2.  GENERAL:  This patient is a moderately to markedly obese black female,  who is alert and cooperative at the time of examination.  HEENT:  Head is atraumatic.  Eyes, pupils are round and react to light.  Discs are flat bilaterally.  Extraocular movements are full.  Visual  fields are full.  Speech is slightly dysarthric, not aphasic.  NECK:  Supple.  No carotid bruits noted.  RESPIRATORY:  Clear.  CARDIOVASCULAR:  Regular rate and rhythm.  No obvious murmurs or rubs  noted.  EXTREMITIES:  Trace edema of the ankles bilaterally.  NEUROLOGIC:  Cranial nerves as above.  Facial symmetry is not present.  The patient has an examination consistent with pulling the side of the  face to the left with squinting of the left eye, flattening of the right  nasolabial fold.  The patient reports decreased pinprick sensation on  the right as compared to the left, but  decreased vibratory sensation on  the left as compared to the right, splits midline with vibratory  sensation.  Tongue protrudes to the midline.  The patient will extinct  with double simultaneous stimulation on the arms, legs, and face.  Motor  test reveals some giveaway weakness on the left arm and left leg.  No  true weakness is seen.  However, the patient does have some drift on the  left arm and left leg compared to the right.  The patient has no ataxia,  finger-nose-finger, and heel-to-shin.  Gait was not tested.  Deep tendon  reflex reveals slight increase in the right biceps reflex compared to  the left.  Otherwise, reflex in the legs were depressed, but symmetric.  Toes are neutral bilaterally.   CT scan of the brain is as above.  NIH stroke scale score is 8.   LABORATORY VALUES:  Notable for white count 9.2, hemoglobin 12.2,  hematocrit 37.4, MCV of 84.7, and platelets are 251.  Sodium 138,  potassium 3.7, chloride 107, CO2 of 25, glucose of 130, BUN of 8,  creatinine of 0.86, and calcium of 9.0.   Chemistries revealed sodium 138, potassium 3.7, chloride of 107, CO2 of  25, glucose 130, BUN of 8, and creatinine of 0.86.  CK is 472 and  troponin I 0.01.   IMPRESSION:  1. New onset of left hemiparesis, left hemisensory deficit.  2. Functional examination.  3. Polymyositis.  4. Obesity.  5. Hypertension.   This patient does have risk factors for stroke.  The patient has a  history of lupus.  Clinical examination, however, today shows features  that are most consistent with voluntary pulling of the left face to the  left and the patient splits midline with vibratory sensation, need to be  concerned that the patient may be embellishing a true deficit or may be  having a hysterical conversion reaction.  Further workup as indicated.   PLAN:  1. The patient will be admitted to [facility].  2. MRI of the brain.  3. MRI angiogram of the intracranial and extracranial  vessels.  4. Bedside swallow study has been done and the patient did not pass      the study.  5. Speech Therapy would be evaluating this patient and will get an  a.m. lipid panel.  The patient is not a t-PA candidate due to      duration of symptoms.       Marlan Palau, M.D.  Electronically Signed     CKW/MEDQ  D:  11/10/2008  T:  11/11/2008  Job:  [id number]   cc:   [facility]

## 2010-12-14 NOTE — H&P (Signed)
[name], [name]                 ACCOUNT NO.:  [id number]   MEDICAL RECORD NO.:  [id number]          PATIENT TYPE:  INP   LOCATION:  1825                         FACILITY:  [facility]   PHYSICIAN:  Eduard Clos, MDDATE OF BIRTH:  05-28-1971   DATE OF ADMISSION:  11/10/2008  DATE OF DISCHARGE:                              HISTORY & PHYSICAL   PRIMARY CARE Dallis Czaja:  Della Goo, M.D.   CHIEF COMPLAINT:  Left facial numbness and left upper and lower  extremity weakness.   HISTORY OF PRESENT ILLNESS:  A 40 year old female with a known history  of lupus, hypertension, Sjogren's syndrome who presented to the ER with  complaints of left facial numbness, drooping, and left upper and lower  extremity weakness that started when she noticed when she woke up in the  morning around 7 a.m.  Since then, her weakness has been persistent, did  not improve, and the facial numbness also has persisted.  She also  initially had some chest pain, which was retrosternal, and shooting in  nature, nonradiating, lasts a few seconds and recurs.  The patient in  the ER was evaluated with a CT of the head, which did not show any acute  findings, has been admitted for further evaluation and management.  As  the patient's symptoms have occurred when they woke up and onset of time  is not known, the patient is not a candidate for tPA.  The patient  already had a bedside swallow evaluation, which she has failed.  Will be  getting a formal speech evaluation.   At this time, the patient denies any chest pain, but she lists chest  pain off and on since morning, which lasts a few seconds, comes every 15-  20 minutes.  EKG shows sinus rhythm with T wave changes, which are  comparable to the EKG done in October 2008.  The patient denies any  shortness of breath.  Denies any abdominal pain, nausea or vomiting,  diarrhea, fevers or chills, headache, visual symptoms, dysuria,  discharge.   PAST MEDICAL HISTORY:   Hypertension.  Previous history of Bell's palsy  on the left side, which did not fully recover.  Lupus.  Sjogren's  syndrome.   PAST SURGICAL HISTORY:  Tubal ligation and biopsies of muscles for her  lupus.   MEDICATION PRIOR TO ADMISSION:  1. Hydroxychloroquine 200 mg p.o. b.i.d.  2. Vitamin D 50,000 units p.o. weekly.  3. Azathioprine 50 mg 3 tablets p.o. daily.  4. Prednisone 5 mg p.o. daily.  5. Actanol 150 mg p.o. monthly.  6. Diovan 100 mg p.o. daily.  7. Atenolol 50 mg p.o. daily.   ALLERGIES:  PERCOCET.   FAMILY HISTORY:  Nothing contributory.   SOCIAL HISTORY:  The patient lives with her son.  Denies smoking  cigarettes or drinking alcohol, using illegal drugs.   REVIEW OF SYSTEMS:  As per history of present illness.  Nothing else  significant.   PHYSICAL EXAMINATION:  CONSTITUTIONAL:  The patient examined at bedside.  Not in acute distress.  VITAL SIGNS:  Blood pressure is 159/90, pulse  96 per minute, temperature  98.3, respirations 18, O2 saturation 100%.  HEENT:  There is no movement on the right side of the face.  She is able  to close the right eyelid.  On the left side, all muscles are pulled  over to the left side.  She is able to move the facial muscles on the  left side.  PERLA positive.  Tongue is midline.  NECK:  No neck rigidity.  CHEST:  Bilateral air entry present.  No rhonchi.  No crepitus.  HEART:  S1, S2 heard.  ABDOMEN:  Soft, nontender, bowel sounds heard.  CNS:  The patient is alert and oriented to time place and person.  Moves  the right upper and lower extremities 5/5.  Left, there is decreased  gripping.  There is pronator drift on the left side on the upper  extremity.  Strength is 4/5 in the left upper extremity and 4/5 in the  left lower extremity.  There is no tardive dyskinesia or ataxia.  Sensation grossly intact.  EXTREMITIES:  Peripheral pulses felt.  No edema.   LABS:  CT of the head with no acute or focal intracranial  abnormality  visible at this time.  Changes of chronic sinusitis.  Chest x-ray shows  low lung volumes with probable vascular crowding and mild bibasilar  atelectasis.  CBC, WBC is 9.2, hemoglobin 12.4, hematocrit 37.4,  platelets 251,000.  Neutrophils 50%.  Basic metabolic panel, sodium 138,  potassium 3.7, chloride 107, carbon dioxide 25, glucose 130, BUN 8,  creatinine 0.8, calcium 9.  EKG normal sinus rhythm.  Comparable to the  old EKG with T wave changes seen October 2008.   ASSESSMENT:  1. Cerebrovascular accident with left-sided hemiparesis.  Time of      onset not known.  2. Atypical chest pain.  3. History of lupus and Sjogren's syndrome.  4. Morbid obesity.  5. History of hypertension.   PLAN:  Will admit the patient to telemetry unit.  Place the patient on  neuro checks.  Get an MRI/MRA of the brain, 2D echocardiogram,  transcranial Dopplers, sedimentation rate, C-reactive protein, T3 and T4  levels.  Place the patient on aspirin per rectal, get a formal swallow  evaluation.  At this time, keep n.p.o. as the patient has failed swallow  evaluation and will get a neuro evaluation and PT/OT consult, and  further recommendations as condition evolves.      Eduard Clos, MD  Electronically Signed     ANK/MEDQ  D:  11/10/2008  T:  11/10/2008  Job:  [id number]   cc:   Della Goo, M.D.

## 2010-12-14 NOTE — Discharge Summary (Signed)
NAMECARMESHA, [name]                 ACCOUNT NO.:  [id number]   MEDICAL RECORD NO.:  [id number]          PATIENT TYPE:  INP   LOCATION:  3029                         FACILITY:  [facility]   PHYSICIAN:  Michelene Gardener, MD    DATE OF BIRTH:  1971-03-20   DATE OF ADMISSION:  11/10/2008  DATE OF DISCHARGE:  11/13/2008                               DISCHARGE SUMMARY   DISCHARGE DIAGNOSES:  1. Bell palsy.  2. Left-sided weakness.  3. Hypertension.  4. Systemic lupus erythematosus.  5. Guillain-Barre syndrome.  6. Obesity.   DISCHARGE MEDICATIONS:  1. Hydroxychloroquine 200 mg p.o. twice daily.  2. Vitamin D 50,000 units p.o. once weekly.  3. Azathioprine 50 mg 3 tablets p.o. once daily.  4. Prednisone 5 mg once a day.  5. Actonol 150 mg p.o. once daily.  6. Diovan 100 mg once a day.  7. Atenolol 50 mg once a day.   CONSULTATIONS:  1. Neuro consult by Dr. Pearlean Brownie.   PROCEDURES:  None.   DIAGNOSTIC STUDIES:  1. Chest x-ray on November 10, 2008, showed mild atelectasis with no      acute findings.  2. CT scan of the head without contrast on November 10, 2008, showed no      acute or focal intracranial abnormalities.  Open MRI showed no      acute stroke.  Echocardiogram showed ejection fraction of 45-50%      with no evidence of wall motion abnormalities, and there is no      embolic source for stroke.  3. Carotid Doppler showed no evidence of hemodynamically significant      stenosis.  Followup with primary doctor within a week.   COURSE OF HOSPITALIZATION:  1. Non organic Left-sided weakness.  This patient presented with left-      sided weakness which is questionable for stroke at the time of      admission.  The patient was admitted to the Stroke Unit.  CT scan      of the head was done without contrast at the time of admission and      came negative for acute stroke.  MRI of the brain was done as an      open MRI because of her weight and that again came to be negative.  Echocardiogram was done and showed no evidence of embolic source      for stroke.  Carotid Doppler was done and showed no significant      hemodynamic stenosis.  The patient was evaluated by Dr. Pearlean Brownie in      the hospital and he thinks this patient does not have a stroke and      her symptoms are only secondary to Bell palsy.  No further workup      is recommended at this time.  PT and OT and Swallow and Speech      evaluated the patient during the hospital and the patient will be      set up for outpatient physical therapy.  At the time of discharge,      the  patient is still having weakness in the left side of her face      which is consistent with her Bell palsy with minimal left-sided      weakness.  2. Bell palsy.  Again as mentioned, workup has been negative and no      further diagnostic workup or treatment is recommended by Neurology.  3. Hypertension.  Blood pressure has been controlled.  Same      medications were continued.   Otherwise, other medical conditions remained stable during this  hospitalization.  The patient will be discharged today in stable  condition.  She still had facial symptoms consistent with facial palsy.   Assessment time is 40 minutes.       Michelene Gardener, MD  Electronically Signed     NAE/MEDQ  D:  11/13/2008  T:  11/14/2008  Job:  [id number]   cc:   Della Goo, M.D.

## 2010-12-14 NOTE — Discharge Summary (Signed)
[name], [name]                 ACCOUNT NO.:  [id number]   MEDICAL RECORD NO.:  [id number]          PATIENT TYPE:  INP   LOCATION:  3017                         FACILITY:  [facility]   PHYSICIAN:  Wilson Singer, M.D.DATE OF BIRTH:  [birth year]   DATE OF ADMISSION:  05/25/2007  DATE OF DISCHARGE:  05/28/2007                               DISCHARGE SUMMARY   FINAL DISCHARGE DIAGNOSES:  1. Musculoskeletal chest pain.  2. Bronchitis.  3. Systemic lupus erythematosus, stable.  4. Obesity.   CONDITION ON DISCHARGE:  Stable.   MEDICATIONS ON DISCHARGE:  1. Atenolol 50 mg daily.  2. Claritin 10 mg daily.  3. Omeprazole 20 mg daily.  4. Allegra-D as required.  5. Hydrocodone as required.  6. Prednisone tapering dose.  7. Avelox 400 mg daily for 5 days.  8. Colace 100 mg b.i.d. for constipation.   HISTORY:  This 40 year old lady with a history of SLE presented with  chills, cough and right-sided musculoskeletal chest pain.  Please see  initial history and physical examination done by [facility]. Elnour, MD.   HOSPITAL PROGRESS:  The patient was admitted and serial cardiac enzymes  were negative.  It was clear that the chest pain really was  musculoskeletal in nature and she was put on intravenous steroids for at  least 24 hours, and this made a significant improvement in her  condition.  On the day of discharge she was still coughing up yellow  sputum but she was not wheezing as much as she had been.   On physical examination today, temperature 98.9, blood pressure 132/67,  pulse 76, saturation 93% on room air.  She is not orthostatic as she had  been complaining of lightheadedness on standing.  Lung fields are  entirely clear with no wheezes or crackles.   Investigations today show a sodium of 141, potassium 3.4, bicarbonate  27, BUN 12, creatinine 1.01.  Hemoglobin 11.6, platelets 271, white  blood cell count 18.9, which I think is related to steroids.   FURTHER DISPOSITION:   She is being given potassium to replete it today  and I am sending her home on another 5-day course of Avelox and a  tapering course of prednisone over the next 6 days or so.  I have asked  her to make sure she follows up with her primary care physician, Dr.  Della Goo, as soon as possible in the next few days.      Wilson Singer, M.D.  Electronically Signed     NCG/MEDQ  D:  05/28/2007  T:  05/28/2007  Job:  [id number]   cc:   Della Goo, M.D.

## 2010-12-17 NOTE — Op Note (Signed)
NAMEJAQUANNA, BALLENTINE                 ACCOUNT NO.:  [id number]   MEDICAL RECORD NO.:  [id number]          PATIENT TYPE:  AMB   LOCATION:  NESC                         [facility]ACILITY[facility]   PHYSICIAN:  Leonie Man, M.D.   DATE OF BIRTH:  [birth year]   DATE OF PROCEDURE:  08/31/2005  DATE OF DISCHARGE:                                 OPERATIVE REPORT   PREOPERATIVE DIAGNOSIS:  Epidermoid cyst of left breast.   POSTOPERATIVE DIAGNOSIS:  Epidermoid cyst of left breast.   PROCEDURE:  Excision epidermoid cyst left breast.   SURGEON:  Leonie Man, M.D.   ASSISTANT:  OR tech.   ANESTHESIA:  General.   NOTE:  Ms. Boullion is a 40 year old woman with recurrent infection off a large  sebaceous cyst in the medial aspect of her right breast, just close to the  inframammary fold. The patient comes to the operating room, now, for  excision of this cyst after the risks and potential benefits of surgery have  been discussed. All questions answered and consent obtained.   DESCRIPTION OF PROCEDURE:  Following the induction of satisfactory general  anesthesia with the patient positioned supinely. The area around the cyst in  the left breast was prepped and draped to be included in a sterile operative  field. I outlined an elliptical incision to approximate the inframammary  fold and infiltrate the region with 0.5% Marcaine with epinephrine. The cyst  and its entirety was excised with this elliptical incision carrying the  dissection all the way down to the sternum and chest wall. This was removed  and forwarded for pathologic evaluation.   Hemostasis was assured with electrocautery. Sponge and instrument counts  verified. Subcutaneous tissues closed with interrupted 3-0 Vicryl sutures.  Skin closed with a running 5-0 Monocryl suture and then reinforced with  Steri-Strips. Sterile dressings applied. Anesthetic reversed; the patient  removed from the operating room to the recovery room in stable  condition.  She tolerated the procedure well.      Leonie Man, M.D.  Electronically Signed     PB/MEDQ  D:  08/31/2005  T:  08/31/2005  Job:  [id number]

## 2011-01-18 ENCOUNTER — Ambulatory Visit: Payer: Medicaid Other | Admitting: *Deleted

## 2011-01-31 ENCOUNTER — Encounter: Payer: Medicaid Other | Attending: Internal Medicine | Admitting: *Deleted

## 2011-01-31 DIAGNOSIS — Z713 Dietary counseling and surveillance: Secondary | ICD-10-CM | POA: Insufficient documentation

## 2011-02-24 ENCOUNTER — Ambulatory Visit (INDEPENDENT_AMBULATORY_CARE_PROVIDER_SITE_OTHER): Payer: Medicaid Other | Admitting: Endocrinology

## 2011-02-24 ENCOUNTER — Encounter: Payer: Self-pay | Admitting: Endocrinology

## 2011-02-24 ENCOUNTER — Other Ambulatory Visit (INDEPENDENT_AMBULATORY_CARE_PROVIDER_SITE_OTHER): Payer: Medicaid Other

## 2011-02-24 VITALS — BP 134/82 | HR 82 | Temp 98.7°F | Ht 68.0 in | Wt 378.0 lb

## 2011-02-24 DIAGNOSIS — E109 Type 1 diabetes mellitus without complications: Secondary | ICD-10-CM

## 2011-02-24 LAB — HEMOGLOBIN A1C: Hgb A1c MFr Bld: 9.7 % — ABNORMAL HIGH (ref 4.6–6.5)

## 2011-02-24 NOTE — Progress Notes (Signed)
  Subjective:    Patient ID: [name], female    DOB: 1971/02/13, 40 y.o.   MRN: [id number]  HPI Pt says cbg's are still sometimes over 200 at hs.  It is lower in am.  It is lowest in am, and higher as the day goes on.  pt states she feels well in general. Past Medical History  Diagnosis Date  . IDDM 08/09/2010  . Bell's palsy 08/09/2010  . SLE 08/09/2010  . SJOGREN'S SYNDROME 08/09/2010    No past surgical history on file.  History   Social History  . Marital Status: Single    Spouse Name: N/A    Number of Children: N/A  . Years of Education: N/A   Occupational History  .      student   Social History Main Topics  . Smoking status: Former Smoker    Quit date: 08/01/1980  . Smokeless tobacco: Not on file  . Alcohol Use: No  . Drug Use: No  . Sexually Active: Not on file   Other Topics Concern  . Not on file   Social History Narrative   Regular exercise-yes    Current Outpatient Prescriptions on File Prior to Visit  Medication Sig Dispense Refill  . alendronate (FOSAMAX) 70 MG tablet Take 70 mg by mouth every 7 (seven) days. Take with a full glass of water on an empty stomach.       Marland Kitchen atenolol (TENORMIN) 100 MG tablet Take 100 mg by mouth at bedtime.        Marland Kitchen azaTHIOprine (IMURAN) 50 MG tablet 2 tablets by mouth two times a day       . furosemide (LASIX) 40 MG tablet 1 tablet by mouth every morning       . hydroxychloroquine (PLAQUENIL) 200 MG tablet Take 200 mg by mouth daily.        . insulin aspart (NOVOLOG FLEXPEN) 100 UNIT/ML injection Inject into the skin 3 (three) times daily before meals. 3x a day (just before each meal) 30-35-50 units.      Marland Kitchen losartan (COZAAR) 100 MG tablet Take 100 mg by mouth daily.        . metFORMIN (GLUCOPHAGE) 500 MG tablet Take 500 mg by mouth 2 (two) times daily with a meal.        . potassium chloride (KLOR-CON) 10 MEQ CR tablet Take 10 mEq by mouth daily.        . pravastatin (PRAVACHOL) 10 MG tablet Take 10 mg by mouth at bedtime.         . predniSONE (DELTASONE) 5 MG tablet 2 1/2 tablets each morning         Allergies  Allergen Reactions  . Oxycodone-Acetaminophen     Family History  Problem Relation Age of Onset  . Diabetes Mother   . Hypertension Other   . Stroke Other   . Arthritis Other     BP 134/82  Pulse 82  Temp(Src) 98.7 F (37.1 C) (Oral)  Ht 5\' 8"  (1.727 m)  Wt 378 lb (171.46 kg)  BMI 57.47 kg/m2  SpO2 96%  Review of Systems denies hypoglycemia    Objective:   Physical Exam GENERAL: no distress.  Morbid obesity SKIN: Insulin injection sites at the triceps areas are normal    Lab Results  Component Value Date   HGBA1C 9.7* 02/24/2011   Assessment & Plan:  Dm.  needs increased rx

## 2011-02-24 NOTE — Patient Instructions (Addendum)
blood tests are being ordered for you today.  please call [phone] to hear your test results.  You will be prompted to enter the 9-digit "MRN" number that appears at the top left of this page, followed by #.  Then you will hear the message. pending the test results, please increase novolog to three times a day (just before each meal) 30-35-50 units. Please make a follow-up appointment in 3 months (update: i left message on phone-tree:  rx as we discussed).

## 2011-03-10 ENCOUNTER — Encounter: Payer: Medicaid Other | Attending: Internal Medicine | Admitting: *Deleted

## 2011-03-10 ENCOUNTER — Encounter: Payer: Self-pay | Admitting: *Deleted

## 2011-03-10 DIAGNOSIS — Z713 Dietary counseling and surveillance: Secondary | ICD-10-CM | POA: Insufficient documentation

## 2011-03-10 NOTE — Progress Notes (Signed)
  Medical Nutrition Therapy:  Appt start time: 0830 end time:  0900.  Assessment:  Primary concerns today: diabetes management and weight control. Yaffa reports that eating regular meals and making good dietary choices has been difficult due to traveling so much this summer. She notes elevated stress levels due to her son and his legal problems. Jensyn reports that she is regular with carbohydrate counting (20-45g/meal and 15g/snack). Pt reports increased depression due to inability to lose weight.  MEDICATIONS: Dr. Everardo All increased Novolog at last visit    DIETARY INTAKE:  Usual eating pattern includes 2 meals and 0-1 snacks per day.  24-hr recall:  B (8 AM): oatmeal (1 pack)  Snk ( AM): N/A  L (12-1 PM): 8 inch hoagie sandwich OR soup Snk ( PM): N/A  D (6-8PM): Chicken breast (grilled) w/ 1 cup of green vegetable Snk (9-11 PM): yogurt cup OR special K bars   Beverages: Diet green tea, water, juice  Usual physical activity: Mirranda reports very limited exercise and extreme fatigue. She reports that she sleeps most of the time.  CBG checks: Before meals Avg CBG: FBS=130-160, 2 hrs before=160-180's  Lab Results  Component Value Date   HGBA1C 9.7* 02/24/2011   Estimated energy needs: 1800 calories <200 g carbohydrates 85-90 g protein 55-60 g fat  Progress Towards Goal(s):  In progress.   Nutritional Diagnosis:  [place]-3.3 Overweight/obesity As related to limited physical activity and frequent meal skipping.  As evidenced by pt with BMI 57.    Intervention:    D/C juices  Eat 3 meals and 2 snacks, every 3-5 hrs  Limit carbohydrate intake to 30-45 grams carbohydrate/meal  Limit carbohydrate intake to 15-20 grams carbohydrate/snack  Add lean protein foods to meals/snacks  Monitor glucose levels as instructed by your doctor  Aim for 15-30 mins of physical activity daily  Bring food record and glucose log to your next nutrition visit  *Pt would like to try "Pre-Op"  Bariatric Surgery Diet for weight loss. She is interested in bariatric surgery yet [facility] does not accept Medicaid. Referred pt to [facility] surgery program info seminar for more information. This program accepts Medicaid.  Monitoring/Evaluation:  Dietary intake, exercise, glucose control, and body weight in 2 month(s).

## 2011-03-10 NOTE — Patient Instructions (Signed)
Goals:  D/C juices  Eat 3 meals and 2 snacks, every 3-5 hrs  Limit carbohydrate intake to 30-45 grams carbohydrate/meal  Limit carbohydrate intake to 15-20 grams carbohydrate/snack  Add lean protein foods to meals/snacks  Monitor glucose levels as instructed by your doctor  Aim for 15-30 mins of physical activity daily  Bring food record and glucose log to your next nutrition visit

## 2011-04-25 ENCOUNTER — Encounter: Payer: Medicaid Other | Admitting: *Deleted

## 2011-05-11 LAB — CARDIAC PANEL(CRET KIN+CKTOT+MB+TROPI)
CK, MB: 1.1
Relative Index: 0.4
Total CK: 339 — ABNORMAL HIGH
Troponin I: 0.02

## 2011-05-11 LAB — CBC
HCT: 35.8 — ABNORMAL LOW
MCHC: 33
MCV: 83.6
Platelets: 271
Platelets: 271
Platelets: 272
RBC: 4.38
RBC: 4.52
RDW: 13.8
WBC: 18.9 — ABNORMAL HIGH
WBC: 6.3
WBC: 7.6

## 2011-05-11 LAB — COMPREHENSIVE METABOLIC PANEL
ALT: 14
AST: 19
AST: 20
Albumin: 2.9 — ABNORMAL LOW
Alkaline Phosphatase: 54
BUN: 9
CO2: 24
Chloride: 110
Chloride: 111
GFR calc Af Amer: >60
GFR calc non Af Amer: >60
Potassium: 3.6
Sodium: 140
Total Bilirubin: 0.4
Total Bilirubin: 0.5

## 2011-05-11 LAB — CK TOTAL AND CKMB (NOT AT ARMC)
CK, MB: 1.7
Total CK: 449 — ABNORMAL HIGH

## 2011-05-11 LAB — BASIC METABOLIC PANEL
BUN: 12
BUN: 5 — ABNORMAL LOW
CO2: 25
Calcium: 9
Chloride: 104
Creatinine, Ser: 0.89
Creatinine, Ser: 1.01
GFR calc non Af Amer: >60
GFR calc non Af Amer: >60
Glucose, Bld: 104 — ABNORMAL HIGH

## 2011-05-11 LAB — DIFFERENTIAL
Basophils Absolute: 0.1
Eosinophils Absolute: 0.2
Lymphs Abs: 3.6 — ABNORMAL HIGH
Monocytes Absolute: 0.9 — ABNORMAL HIGH
Monocytes Relative: 10
Neutro Abs: 3.7

## 2011-05-11 LAB — PROTIME-INR: Prothrombin Time: 13

## 2011-05-26 ENCOUNTER — Other Ambulatory Visit: Payer: Medicaid Other

## 2011-05-26 ENCOUNTER — Other Ambulatory Visit (INDEPENDENT_AMBULATORY_CARE_PROVIDER_SITE_OTHER): Payer: Medicaid Other

## 2011-05-26 ENCOUNTER — Encounter: Payer: Self-pay | Admitting: Endocrinology

## 2011-05-26 ENCOUNTER — Ambulatory Visit (INDEPENDENT_AMBULATORY_CARE_PROVIDER_SITE_OTHER): Payer: Medicaid Other | Admitting: Endocrinology

## 2011-05-26 VITALS — BP 132/88 | HR 84 | Temp 98.8°F | Ht 68.0 in | Wt 394.0 lb

## 2011-05-26 DIAGNOSIS — E109 Type 1 diabetes mellitus without complications: Secondary | ICD-10-CM

## 2011-05-26 NOTE — Progress Notes (Signed)
Subjective:    Patient ID: [name], female    DOB: 1970/11/01, 40 y.o.   MRN: [id number]  HPI pt states she feels well in general.  no cbg record, but states cbg's vary from 70-150.  It is in general higher as the day goes on.  She saw the dietician and this helped.  However, she continues to struggle with her weight.   Past Medical History  Diagnosis Date  . IDDM 08/09/2010  . Bell's palsy 08/09/2010  . SLE 08/09/2010  . SJOGREN'S SYNDROME 08/09/2010    No past surgical history on file.  History   Social History  . Marital Status: Single    Spouse Name: N/A    Number of Children: N/A  . Years of Education: N/A   Occupational History  .      student   Social History Main Topics  . Smoking status: Former Smoker    Quit date: 08/01/1980  . Smokeless tobacco: Not on file  . Alcohol Use: No  . Drug Use: No  . Sexually Active: Not on file   Other Topics Concern  . Not on file   Social History Narrative   Regular exercise-yes    Current Outpatient Prescriptions on File Prior to Visit  Medication Sig Dispense Refill  . alendronate (FOSAMAX) 70 MG tablet Take 70 mg by mouth every 7 (seven) days. Take with a full glass of water on an empty stomach.       . ALPRAZolam (XANAX) 1 MG tablet Take 1 mg by mouth 2 (two) times daily.        Marland Kitchen atenolol (TENORMIN) 100 MG tablet Take 100 mg by mouth at bedtime.        Marland Kitchen azaTHIOprine (IMURAN) 50 MG tablet 2 tablets by mouth two times a day       . furosemide (LASIX) 40 MG tablet 1 tablet by mouth every morning       . hydroxychloroquine (PLAQUENIL) 200 MG tablet Take 200 mg by mouth daily.        . insulin aspart (NOVOLOG FLEXPEN) 100 UNIT/ML injection Inject into the skin 3 (three) times daily before meals. 3x a day (just before each meal) 30-35-50 units.      Marland Kitchen losartan (COZAAR) 100 MG tablet Take 100 mg by mouth daily.        . metFORMIN (GLUCOPHAGE) 500 MG tablet Take 500 mg by mouth 2 (two) times daily with a meal.        .  potassium chloride (KLOR-CON) 10 MEQ CR tablet Take 10 mEq by mouth daily.        . pravastatin (PRAVACHOL) 10 MG tablet Take 10 mg by mouth at bedtime.        . predniSONE (DELTASONE) 5 MG tablet 2 1/2 tablets each morning         Allergies  Allergen Reactions  . Oxycodone-Acetaminophen     Family History  Problem Relation Age of Onset  . Diabetes Mother   . Hypertension Other   . Stroke Other   . Arthritis Other     BP 132/88  Pulse 84  Temp(Src) 98.8 F (37.1 C) (Oral)  Ht 5\' 8"  (1.727 m)  Wt 394 lb (178.717 kg)  BMI 59.91 kg/m2  SpO2 97%  LMP 05/08/2011  Review of Systems denies hypoglycemia    Objective:   Physical Exam VITAL SIGNS:  See vs page GENERAL: no distress.  Obese Pulses: dorsalis pedis intact bilat.   Feet: no  deformity.  no ulcer on the feet.  feet are of normal color and temp.  1+ bilat leg edema.  There is bilateral onychomycosis Neuro: sensation is intact to touch on the feet.  Lab Results  Component Value Date   HGBA1C 8.5* 05/26/2011       Assessment & Plan:  Dm., needs increased rx

## 2011-05-26 NOTE — Patient Instructions (Addendum)
blood tests are being ordered for you today.  please call [phone] to hear your test results.  You will be prompted to enter the 9-digit "MRN" number that appears at the top left of this page, followed by #.  Then you will hear the message. pending the test results, please continue novolog three times a day (just before each meal) 30-35-50 units. Please make a follow-up appointment in 3 months. check your blood sugar 2 times a day.  vary the time of day when you check, between before the 3 meals, and at bedtime.  also check if you have symptoms of your blood sugar being too high or too low.  please keep a record of the readings and bring it to your next appointment here.  please call us sooner if you are having low blood sugar episodes, or if it stays over 200.   (update: i left message on phone-tree:  Increase qac humalog to 35-40-55 units).

## 2011-07-07 ENCOUNTER — Other Ambulatory Visit: Payer: Self-pay | Admitting: Obstetrics & Gynecology

## 2011-07-07 DIAGNOSIS — Z1231 Encounter for screening mammogram for malignant neoplasm of breast: Secondary | ICD-10-CM

## 2011-07-19 DIAGNOSIS — E785 Hyperlipidemia, unspecified: Secondary | ICD-10-CM | POA: Insufficient documentation

## 2011-08-10 ENCOUNTER — Encounter: Payer: Medicaid Other | Attending: Internal Medicine | Admitting: *Deleted

## 2011-08-10 DIAGNOSIS — Z713 Dietary counseling and surveillance: Secondary | ICD-10-CM | POA: Insufficient documentation

## 2011-08-10 DIAGNOSIS — E669 Obesity, unspecified: Secondary | ICD-10-CM | POA: Insufficient documentation

## 2011-08-10 DIAGNOSIS — E119 Type 2 diabetes mellitus without complications: Secondary | ICD-10-CM | POA: Insufficient documentation

## 2011-08-10 NOTE — Progress Notes (Signed)
  Medical Nutrition Therapy: Appt start time: 0806 end time: 0830.   Assessment: Primary concerns today: diabetes management and weight control. [name] reports that over the past 2 months she "checked out" and went home to [place] to take a break from her life here. She has gained 3.1 lbs since her last visit in August and notes that she has been able to keep up glucose levels "in as good of control as they could get". Pt is back home now, back to school, on track with carbohydrate counting (45 grams/meal, 15 grams/snack). Pts A1C improved from July to October, despite continued weight gain.  MEDICATIONS: Dr. Everardo All increased Novolog at last visit (35 units am, 45 units lunch, 55 units, supper)  DIETARY INTAKE:  Usual eating pattern includes 3 meals and 0-1 snacks per day.  24-hr recall:  B (8 AM): oatmeal (1 pack), 2 boiled egg OR Atkin's Protein Shake  Snk ( AM): N/A  L (12-1 PM): Green salad with grilled chicken (light dressing) OR 6 inch tuna subway sub  Snk ( PM): N/A  D (6-8PM): Chicken breast (grilled) w/ 1 cup of green vegetable OR Salmon patty, green beans, and 1/2 cup low-carb pasta Snk (9-11 PM): sugar-free fruit cup  Beverages: Diet green tea, water, protein shake, juice   Usual physical activity: Suzzane reports her exercise is still limited but she's started walking to classes and is beginning the exercise bike again (2 times/week)  CBG checks: Before meals  Avg CBG: FBS=115-135, Before meals=140-165's Lab Results   Component  Value  Date    HGBA1C  9.7*  02/24/2011    Lab Results  Component Value Date   HGBA1C 8.5* 05/26/2011   Estimated energy needs:  1800 calories  <200 g carbohydrates  85-90 g protein  55-60 g fat   Progress Towards Goal(s): In progress.   Nutritional Diagnosis:  [place]-3.3 Overweight/obesity As related to limited physical activity and frequent meal skipping. As evidenced by pt with BMI 57.   Intervention:  D/C juices and sweetened drinks Eat  3 meals and 2 snacks, every 3-5 hrs  Limit carbohydrate intake to 30-45 grams carbohydrate/meal Limit carbohydrate intake to 15-20 grams carbohydrate/snack Add lean protein foods to meals/snacks  Monitor glucose levels as instructed by your doctor  Aim for 15-30 mins of physical activity daily  Bring food record and glucose log to your next nutrition visit  *Pt would like to try "Pre-Op" Bariatric Surgery Diet for weight loss. She is interested in bariatric surgery yet [facility] does not accept Medicaid. Referred pt to [facility] surgery program info seminar for more information. This program accepts Medicaid.   Monitoring/Evaluation: Dietary intake, exercise, glucose control, and body weight in 2 month(s).

## 2011-08-10 NOTE — Patient Instructions (Signed)
Goals:  D/C juices  Eat 3 meals and 2 snacks, every 3-5 hrs  Limit carbohydrate intake to 30-45 grams carbohydrate/meal  Limit carbohydrate intake to 15-20 grams carbohydrate/snack  Add lean protein foods to meals/snacks  Monitor glucose levels as instructed by your doctor  Aim for 15-30 mins of physical activity daily  Bring food record and glucose log to your next nutrition visit 

## 2011-08-15 ENCOUNTER — Ambulatory Visit (HOSPITAL_COMMUNITY): Payer: Medicaid Other

## 2011-08-24 ENCOUNTER — Ambulatory Visit
Admission: RE | Admit: 2011-08-24 | Discharge: 2011-08-24 | Disposition: A | Discharge status: Home / Self Care | Payer: Medicaid Other | Source: Ambulatory Visit | Attending: Internal Medicine | Admitting: Internal Medicine

## 2011-08-24 ENCOUNTER — Other Ambulatory Visit: Payer: Self-pay | Admitting: Internal Medicine

## 2011-08-24 DIAGNOSIS — R0989 Other specified symptoms and signs involving the circulatory and respiratory systems: Secondary | ICD-10-CM

## 2011-08-25 ENCOUNTER — Ambulatory Visit: Payer: Medicaid Other | Admitting: Endocrinology

## 2011-08-31 ENCOUNTER — Other Ambulatory Visit: Payer: Self-pay | Admitting: *Deleted

## 2011-08-31 MED ORDER — INSULIN ASPART 100 UNIT/ML ~~LOC~~ SOLN: SUBCUTANEOUS | Status: DC

## 2011-08-31 NOTE — Telephone Encounter (Signed)
R'cd fax from [facility] for refill of Novolog Flexpen  Last OV-05/2011

## 2011-09-07 ENCOUNTER — Encounter: Payer: Self-pay | Admitting: Endocrinology

## 2011-09-07 ENCOUNTER — Other Ambulatory Visit (INDEPENDENT_AMBULATORY_CARE_PROVIDER_SITE_OTHER): Payer: Medicaid Other

## 2011-09-07 ENCOUNTER — Ambulatory Visit (INDEPENDENT_AMBULATORY_CARE_PROVIDER_SITE_OTHER): Payer: Medicaid Other | Admitting: Endocrinology

## 2011-09-07 VITALS — BP 132/82 | HR 82 | Temp 97.5°F

## 2011-09-07 DIAGNOSIS — E109 Type 1 diabetes mellitus without complications: Secondary | ICD-10-CM

## 2011-09-07 LAB — HEMOGLOBIN A1C: Hgb A1c MFr Bld: 8 % — ABNORMAL HIGH (ref 4.6–6.5)

## 2011-09-07 NOTE — Progress Notes (Signed)
Subjective:    Patient ID: [name], female    DOB: 29-Jan-1971, 41 y.o.   MRN: [id number]  HPI Pt returns for f/u of insulin-requiring DM (2011).  no cbg record, but states cbg's vary from 78-215.  It is in general highest in the afternoon.  pt states she feels well in general. Past Medical History  Diagnosis Date  . IDDM 08/09/2010  . Bell's palsy 08/09/2010  . SLE 08/09/2010  . SJOGREN'S SYNDROME 08/09/2010    No past surgical history on file.  History   Social History  . Marital Status: Single    Spouse Name: N/A    Number of Children: N/A  . Years of Education: N/A   Occupational History  .      student   Social History Main Topics  . Smoking status: Former Smoker    Quit date: 08/01/1980  . Smokeless tobacco: Not on file  . Alcohol Use: No  . Drug Use: No  . Sexually Active: Not on file   Other Topics Concern  . Not on file   Social History Narrative   Regular exercise-yes    Current Outpatient Prescriptions on File Prior to Visit  Medication Sig Dispense Refill  . alendronate (FOSAMAX) 70 MG tablet Take 70 mg by mouth every 7 (seven) days. Take with a full glass of water on an empty stomach.       . ALPRAZolam (XANAX) 1 MG tablet Take 1 mg by mouth 2 (two) times daily.        Marland Kitchen atenolol (TENORMIN) 100 MG tablet Take 100 mg by mouth at bedtime.        Marland Kitchen azaTHIOprine (IMURAN) 50 MG tablet 2 tablets by mouth two times a day       . furosemide (LASIX) 40 MG tablet 1 tablet by mouth every morning       . hydroxychloroquine (PLAQUENIL) 200 MG tablet Take 200 mg by mouth daily.        . insulin aspart (NOVOLOG FLEXPEN) 100 UNIT/ML injection Inject into the skin 3 (three) times daily  (just before each meal) 35-40-55 units.  75 mL  5  . losartan (COZAAR) 100 MG tablet Take 100 mg by mouth daily.        . metFORMIN (GLUCOPHAGE) 500 MG tablet Take 500 mg by mouth 2 (two) times daily with a meal.        . potassium chloride (KLOR-CON) 10 MEQ CR tablet Take 10 mEq by mouth  daily.        . pravastatin (PRAVACHOL) 10 MG tablet Take 10 mg by mouth at bedtime.        . predniSONE (DELTASONE) 5 MG tablet 2 1/2 tablets each morning         Allergies  Allergen Reactions  . Oxycodone-Acetaminophen     Family History  Problem Relation Age of Onset  . Diabetes Mother   . Hypertension Other   . Stroke Other   . Arthritis Other     BP 132/82  Pulse 82  Temp(Src) 97.5 F (36.4 C) (Oral)  SpO2 97%  LMP 08/22/2011  Review of Systems denies hypoglycemia.      Objective:   Physical Exam VITAL SIGNS:  See vs page GENERAL: no distress.  Obese SKIN:  Insulin injection sites at the triceps areas are normal   Lab Results  Component Value Date   HGBA1C 8.0* 09/07/2011      Assessment & Plan:  DM, improved but needs increased  rx

## 2011-09-07 NOTE — Patient Instructions (Addendum)
blood tests are being ordered for you today.  please call [phone] to hear your test results.  You will be prompted to enter the 9-digit "MRN" number that appears at the top left of this page, followed by #.  Then you will hear the message. pending the test results, please continue novolog three times a day (just before each meal) 35-40-55 units. Please make a follow-up appointment in 3 months. check your blood sugar 2 times a day.  vary the time of day when you check, between before the 3 meals, and at bedtime.  also check if you have symptoms of your blood sugar being too high or too low.  please keep a record of the readings and bring it to your next appointment here.  please call us sooner if you are having low blood sugar episodes, or if it stays over 200.   (update: i left message on phone-tree:  Increase qac novolog to 40-50-60 units)

## 2011-09-09 ENCOUNTER — Ambulatory Visit (HOSPITAL_COMMUNITY): Payer: Medicaid Other | Attending: Obstetrics & Gynecology

## 2011-09-21 ENCOUNTER — Encounter: Payer: Medicaid Other | Attending: Internal Medicine | Admitting: *Deleted

## 2011-09-21 DIAGNOSIS — Z713 Dietary counseling and surveillance: Secondary | ICD-10-CM | POA: Insufficient documentation

## 2011-09-21 DIAGNOSIS — E669 Obesity, unspecified: Secondary | ICD-10-CM | POA: Insufficient documentation

## 2011-09-21 DIAGNOSIS — E119 Type 2 diabetes mellitus without complications: Secondary | ICD-10-CM | POA: Insufficient documentation

## 2011-09-21 NOTE — Progress Notes (Signed)
  Medical Nutrition Therapy: Appt start time: 0800 end time: 0830.   Assessment: Primary concerns today: diabetes management and weight control. Wilhemenia reports that over the past 2 months she "checked out" and went home to [place] to take a break from her life here. She has gained 3.1 lbs since her last visit in August and notes that she has been able to keep up glucose levels "in as good of control as they could get". Pt is back home now, back to school, on track with carbohydrate counting (45 grams/meal, 15 grams/snack). Pts A1C improved from July to October, despite continued weight gain.  MEDICATIONS: Dr. Everardo All increased Novolog at last visit (35 units am, 45 units lunch, 55 units, supper)  DIETARY INTAKE:  Usual eating pattern includes 3 meals and 0-1 snacks per day.  24-hr recall:  B (8 AM): oatmeal (1 pack), 2 boiled egg OR Atkin's Protein Shake  Snk ( AM): N/A  L (12-1 PM): Green salad with grilled chicken (light dressing) OR 6 inch tuna subway sub  Snk ( PM): N/A  D (6-8PM): Chicken breast (grilled) w/ 1 cup of green vegetable OR Salmon patty, green beans, and 1/2 cup low-carb pasta Snk (9-11 PM): sugar-free fruit cup  Beverages: Diet green tea, water, protein shake, juice   Usual physical activity: Donnalee reports her exercise is still limited but she's started walking to classes and is beginning the exercise bike again (2 times/week)  CBG checks: Before meals  Avg CBG: FBS=115-135, Before meals=140-165's Lab Results   Component  Value  Date    HGBA1C  9.7*  02/24/2011    Lab Results  Component Value Date   HGBA1C 8.0* 09/07/2011   Estimated energy needs:  1800 calories  <200 g carbohydrates  85-90 g protein  55-60 g fat   Progress Towards Goal(s): In progress.   Nutritional Diagnosis:  [place]-3.3 Overweight/obesity As related to limited physical activity and frequent meal skipping. As evidenced by pt with BMI 57.   Intervention: Continue with current meal plan but  increase activity to spend the calories more effectively  Plan: Check schedule for water aerobics classes and sign up Start Arm chair exercise at home using tapes on tv prorams Plan to be active for 15 minutes every day and put that time on the calendar Continue with meal plan of 3-4 Carb Choices per meal, 0-2 per snack if hungry   Monitoring/Evaluation: Dietary intake, exercise, glucose control, and body weight in 6 weeks(s).

## 2011-09-23 ENCOUNTER — Other Ambulatory Visit: Payer: Self-pay | Admitting: *Deleted

## 2011-09-23 ENCOUNTER — Encounter: Payer: Self-pay | Admitting: *Deleted

## 2011-09-23 MED ORDER — INSULIN ASPART 100 UNIT/ML ~~LOC~~ SOLN: SUBCUTANEOUS | Status: DC

## 2011-09-23 NOTE — Patient Instructions (Signed)
Plan: Check schedule for water aerobics classes and sign up Start Arm chair exercise at home using tapes on tv prorams Plan to be active for 15 minutes every day and put that time on the calendar Continue with meal plan of 3-4 Carb Choices per meal, 0-2 per snack if hungry

## 2011-09-23 NOTE — Telephone Encounter (Signed)
Pt needs new rx for Novolog for increased dosage to go to [facility]. Rx sent, pt informed.

## 2011-10-31 DIAGNOSIS — J189 Pneumonia, unspecified organism: Secondary | ICD-10-CM

## 2011-10-31 HISTORY — DX: Pneumonia, unspecified organism: J18.9

## 2011-11-02 ENCOUNTER — Ambulatory Visit: Payer: Medicaid Other | Admitting: *Deleted

## 2011-11-07 DIAGNOSIS — G4733 Obstructive sleep apnea (adult) (pediatric): Secondary | ICD-10-CM | POA: Diagnosis present

## 2011-11-07 DIAGNOSIS — I5033 Acute on chronic diastolic (congestive) heart failure: Principal | ICD-10-CM | POA: Diagnosis present

## 2011-11-07 DIAGNOSIS — I10 Essential (primary) hypertension: Secondary | ICD-10-CM | POA: Diagnosis present

## 2011-11-07 DIAGNOSIS — G510 Bell's palsy: Secondary | ICD-10-CM | POA: Diagnosis present

## 2011-11-07 DIAGNOSIS — Z6841 Body Mass Index (BMI) 40.0 and over, adult: Secondary | ICD-10-CM

## 2011-11-07 DIAGNOSIS — I509 Heart failure, unspecified: Secondary | ICD-10-CM | POA: Diagnosis present

## 2011-11-07 DIAGNOSIS — J189 Pneumonia, unspecified organism: Secondary | ICD-10-CM | POA: Diagnosis present

## 2011-11-07 DIAGNOSIS — IMO0001 Reserved for inherently not codable concepts without codable children: Secondary | ICD-10-CM | POA: Diagnosis present

## 2011-11-07 DIAGNOSIS — M329 Systemic lupus erythematosus, unspecified: Secondary | ICD-10-CM | POA: Diagnosis present

## 2011-11-07 DIAGNOSIS — D638 Anemia in other chronic diseases classified elsewhere: Secondary | ICD-10-CM | POA: Diagnosis present

## 2011-11-07 DIAGNOSIS — J40 Bronchitis, not specified as acute or chronic: Secondary | ICD-10-CM | POA: Diagnosis present

## 2011-11-07 DIAGNOSIS — E785 Hyperlipidemia, unspecified: Secondary | ICD-10-CM | POA: Diagnosis present

## 2011-11-08 ENCOUNTER — Emergency Department (HOSPITAL_COMMUNITY): Payer: Medicaid Other

## 2011-11-08 ENCOUNTER — Inpatient Hospital Stay (HOSPITAL_COMMUNITY)
Admission: EM | Admit: 2011-11-08 | Discharge: 2011-11-12 | DRG: 291 | Disposition: A | Discharge status: Home / Self Care | Payer: Medicaid Other | Attending: Internal Medicine | Admitting: Internal Medicine

## 2011-11-08 ENCOUNTER — Encounter (HOSPITAL_COMMUNITY): Payer: Self-pay | Admitting: Emergency Medicine

## 2011-11-08 DIAGNOSIS — J189 Pneumonia, unspecified organism: Secondary | ICD-10-CM | POA: Diagnosis present

## 2011-11-08 DIAGNOSIS — G510 Bell's palsy: Secondary | ICD-10-CM | POA: Diagnosis present

## 2011-11-08 DIAGNOSIS — R531 Weakness: Secondary | ICD-10-CM

## 2011-11-08 DIAGNOSIS — E109 Type 1 diabetes mellitus without complications: Secondary | ICD-10-CM

## 2011-11-08 DIAGNOSIS — R079 Chest pain, unspecified: Secondary | ICD-10-CM | POA: Diagnosis present

## 2011-11-08 DIAGNOSIS — I5023 Acute on chronic systolic (congestive) heart failure: Secondary | ICD-10-CM

## 2011-11-08 DIAGNOSIS — I10 Essential (primary) hypertension: Secondary | ICD-10-CM | POA: Diagnosis present

## 2011-11-08 DIAGNOSIS — E119 Type 2 diabetes mellitus without complications: Secondary | ICD-10-CM

## 2011-11-08 DIAGNOSIS — E1159 Type 2 diabetes mellitus with other circulatory complications: Secondary | ICD-10-CM | POA: Diagnosis present

## 2011-11-08 DIAGNOSIS — D649 Anemia, unspecified: Secondary | ICD-10-CM | POA: Diagnosis present

## 2011-11-08 DIAGNOSIS — I5022 Chronic systolic (congestive) heart failure: Secondary | ICD-10-CM | POA: Diagnosis present

## 2011-11-08 DIAGNOSIS — I5042 Chronic combined systolic (congestive) and diastolic (congestive) heart failure: Secondary | ICD-10-CM | POA: Diagnosis present

## 2011-11-08 DIAGNOSIS — R0602 Shortness of breath: Secondary | ICD-10-CM | POA: Diagnosis present

## 2011-11-08 DIAGNOSIS — D72829 Elevated white blood cell count, unspecified: Secondary | ICD-10-CM | POA: Diagnosis present

## 2011-11-08 DIAGNOSIS — M329 Systemic lupus erythematosus, unspecified: Secondary | ICD-10-CM | POA: Diagnosis present

## 2011-11-08 HISTORY — DX: Essential (primary) hypertension: I10

## 2011-11-08 LAB — PHOSPHORUS: Phosphorus: 3.9 mg/dL (ref 2.3–4.6)

## 2011-11-08 LAB — BASIC METABOLIC PANEL
CO2: 28 mEq/L (ref 19–32)
Calcium: 9.5 mg/dL (ref 8.4–10.5)
Creatinine, Ser: 0.77 mg/dL (ref 0.50–1.10)
Glucose, Bld: 103 mg/dL — ABNORMAL HIGH (ref 70–99)

## 2011-11-08 LAB — TSH: TSH: 1.159 u[IU]/mL (ref 0.350–4.500)

## 2011-11-08 LAB — CARDIAC PANEL(CRET KIN+CKTOT+MB+TROPI)
CK, MB: 1.1 ng/mL (ref 0.3–4.0)
Relative Index: 0.8 (ref 0.0–2.5)
Relative Index: 0.9 (ref 0.0–2.5)
Troponin I: <0.3 ng/mL (ref <0.30)
Troponin I: <0.3 ng/mL (ref <0.30)

## 2011-11-08 LAB — CBC
Hemoglobin: 11.4 g/dL — ABNORMAL LOW (ref 12.0–15.0)
MCH: 27.6 pg (ref 26.0–34.0)
MCV: 86.9 fL (ref 78.0–100.0)
RBC: 4.13 MIL/uL (ref 3.87–5.11)

## 2011-11-08 LAB — GLUCOSE, CAPILLARY
Glucose-Capillary: 57 mg/dL — ABNORMAL LOW (ref 70–99)
Glucose-Capillary: 62 mg/dL — ABNORMAL LOW (ref 70–99)
Glucose-Capillary: 89 mg/dL (ref 70–99)

## 2011-11-08 LAB — PRO B NATRIURETIC PEPTIDE: Pro B Natriuretic peptide (BNP): 229.8 pg/mL — ABNORMAL HIGH (ref 0–125)

## 2011-11-08 LAB — MAGNESIUM: Magnesium: 1.7 mg/dL (ref 1.5–2.5)

## 2011-11-08 LAB — D-DIMER, QUANTITATIVE: D-Dimer, Quant: 0.24 ug{FEU}/mL (ref 0.00–0.48)

## 2011-11-08 LAB — HEMOGLOBIN A1C: Mean Plasma Glucose: 194 mg/dL — ABNORMAL HIGH (ref <117)

## 2011-11-08 MED ORDER — DEXTROSE 5 % IV SOLN
1.0000 g | Freq: Once | INTRAVENOUS | Status: AC
  Administered 2011-11-08: 1 g via INTRAVENOUS
  Filled 2011-11-08: qty 10

## 2011-11-08 MED ORDER — DEXTROSE 5 % IV SOLN
500.0000 mg | Freq: Once | INTRAVENOUS | Status: AC
  Administered 2011-11-08: 500 mg via INTRAVENOUS
  Filled 2011-11-08: qty 500

## 2011-11-08 MED ORDER — AZATHIOPRINE 50 MG PO TABS
50.0000 mg | ORAL_TABLET | Freq: Two times a day (BID) | ORAL | Status: DC
  Administered 2011-11-08 – 2011-11-12 (×9): 50 mg via ORAL
  Filled 2011-11-08 (×10): qty 1

## 2011-11-08 MED ORDER — PREDNISONE 10 MG PO TABS
10.0000 mg | ORAL_TABLET | Freq: Every day | ORAL | Status: DC
  Administered 2011-11-08 – 2011-11-12 (×5): 10 mg via ORAL
  Filled 2011-11-08 (×5): qty 1

## 2011-11-08 MED ORDER — SODIUM CHLORIDE 0.9 % IJ SOLN
3.0000 mL | Freq: Two times a day (BID) | INTRAMUSCULAR | Status: DC
  Administered 2011-11-08 – 2011-11-12 (×5): 3 mL via INTRAVENOUS

## 2011-11-08 MED ORDER — INSULIN ASPART 100 UNIT/ML ~~LOC~~ SOLN
50.0000 [IU] | Freq: Every day | SUBCUTANEOUS | Status: DC
  Administered 2011-11-08 – 2011-11-12 (×4): 50 [IU] via SUBCUTANEOUS

## 2011-11-08 MED ORDER — INSULIN ASPART 100 UNIT/ML ~~LOC~~ SOLN
40.0000 [IU] | Freq: Every day | SUBCUTANEOUS | Status: DC
  Administered 2011-11-09 – 2011-11-12 (×4): 40 [IU] via SUBCUTANEOUS

## 2011-11-08 MED ORDER — ONDANSETRON HCL 4 MG/2ML IJ SOLN
4.0000 mg | Freq: Four times a day (QID) | INTRAMUSCULAR | Status: DC | PRN
  Administered 2011-11-08 – 2011-11-09 (×4): 4 mg via INTRAVENOUS
  Filled 2011-11-08 (×3): qty 2

## 2011-11-08 MED ORDER — INSULIN ASPART 100 UNIT/ML ~~LOC~~ SOLN: 40.0000 [IU] | Freq: Three times a day (TID) | SUBCUTANEOUS | Status: DC

## 2011-11-08 MED ORDER — ACETAMINOPHEN 325 MG PO TABS: 650.0000 mg | ORAL_TABLET | Freq: Four times a day (QID) | ORAL | Status: DC | PRN

## 2011-11-08 MED ORDER — FUROSEMIDE 40 MG PO TABS
40.0000 mg | ORAL_TABLET | Freq: Every day | ORAL | Status: DC
  Administered 2011-11-08: 40 mg via ORAL
  Filled 2011-11-08: qty 1

## 2011-11-08 MED ORDER — ALPRAZOLAM 1 MG PO TABS
1.0000 mg | ORAL_TABLET | Freq: Two times a day (BID) | ORAL | Status: DC
  Administered 2011-11-08 – 2011-11-12 (×9): 1 mg via ORAL
  Filled 2011-11-08 (×11): qty 1

## 2011-11-08 MED ORDER — DEXTROSE 5 % IV SOLN
500.0000 mg | INTRAVENOUS | Status: DC
  Administered 2011-11-09: 500 mg via INTRAVENOUS
  Filled 2011-11-08 (×2): qty 500

## 2011-11-08 MED ORDER — ENOXAPARIN SODIUM 30 MG/0.3ML ~~LOC~~ SOLN
30.0000 mg | SUBCUTANEOUS | Status: DC
  Administered 2011-11-08: 30 mg via SUBCUTANEOUS
  Filled 2011-11-08 (×2): qty 0.3

## 2011-11-08 MED ORDER — MORPHINE SULFATE 4 MG/ML IJ SOLN
4.0000 mg | Freq: Once | INTRAMUSCULAR | Status: AC
  Administered 2011-11-08: 4 mg via INTRAVENOUS
  Filled 2011-11-08: qty 1

## 2011-11-08 MED ORDER — ATENOLOL 100 MG PO TABS
100.0000 mg | ORAL_TABLET | Freq: Every day | ORAL | Status: DC
  Administered 2011-11-08 – 2011-11-11 (×4): 100 mg via ORAL
  Filled 2011-11-08 (×5): qty 1

## 2011-11-08 MED ORDER — INSULIN ASPART 100 UNIT/ML ~~LOC~~ SOLN
0.0000 [IU] | SUBCUTANEOUS | Status: DC
  Administered 2011-11-08 (×2): 2 [IU] via SUBCUTANEOUS
  Administered 2011-11-09: 1 [IU] via SUBCUTANEOUS
  Administered 2011-11-09: 2 [IU] via SUBCUTANEOUS
  Administered 2011-11-10: 3 [IU] via SUBCUTANEOUS
  Administered 2011-11-10 (×3): 1 [IU] via SUBCUTANEOUS
  Administered 2011-11-10: 5 [IU] via SUBCUTANEOUS
  Administered 2011-11-10 – 2011-11-11 (×4): 1 [IU] via SUBCUTANEOUS
  Filled 2011-11-08: qty 1

## 2011-11-08 MED ORDER — PROMETHAZINE HCL 25 MG/ML IJ SOLN
12.5000 mg | Freq: Once | INTRAMUSCULAR | Status: AC
  Administered 2011-11-08: 12.5 mg via INTRAVENOUS
  Filled 2011-11-08: qty 1

## 2011-11-08 MED ORDER — PNEUMOCOCCAL VAC POLYVALENT 25 MCG/0.5ML IJ INJ
0.5000 mL | INJECTION | INTRAMUSCULAR | Status: AC
  Administered 2011-11-09: 0.5 mL via INTRAMUSCULAR
  Filled 2011-11-08: qty 0.5

## 2011-11-08 MED ORDER — FUROSEMIDE 10 MG/ML IJ SOLN
40.0000 mg | Freq: Every day | INTRAMUSCULAR | Status: DC
  Administered 2011-11-08 – 2011-11-09 (×2): 40 mg via INTRAVENOUS
  Filled 2011-11-08 (×2): qty 4

## 2011-11-08 MED ORDER — DEXTROSE 5 % IV SOLN
1.0000 g | INTRAVENOUS | Status: DC
  Administered 2011-11-09 – 2011-11-10 (×2): 1 g via INTRAVENOUS
  Filled 2011-11-08 (×3): qty 10

## 2011-11-08 MED ORDER — ONDANSETRON HCL 4 MG PO TABS: 4.0000 mg | ORAL_TABLET | Freq: Four times a day (QID) | ORAL | Status: DC | PRN

## 2011-11-08 MED ORDER — HYDROXYCHLOROQUINE SULFATE 200 MG PO TABS
200.0000 mg | ORAL_TABLET | Freq: Every day | ORAL | Status: DC
  Administered 2011-11-08 – 2011-11-12 (×5): 200 mg via ORAL
  Filled 2011-11-08 (×5): qty 1

## 2011-11-08 MED ORDER — SIMVASTATIN 20 MG PO TABS
20.0000 mg | ORAL_TABLET | Freq: Every day | ORAL | Status: DC
  Administered 2011-11-08 – 2011-11-11 (×4): 20 mg via ORAL
  Filled 2011-11-08 (×5): qty 1

## 2011-11-08 MED ORDER — ASPIRIN 325 MG PO TABS
325.0000 mg | ORAL_TABLET | ORAL | Status: AC
  Administered 2011-11-08: 325 mg via ORAL
  Filled 2011-11-08: qty 1

## 2011-11-08 MED ORDER — INSULIN ASPART 100 UNIT/ML ~~LOC~~ SOLN
60.0000 [IU] | Freq: Every evening | SUBCUTANEOUS | Status: DC
  Administered 2011-11-08 – 2011-11-11 (×4): 60 [IU] via SUBCUTANEOUS

## 2011-11-08 MED ORDER — GLIPIZIDE 10 MG PO TABS
10.0000 mg | ORAL_TABLET | Freq: Two times a day (BID) | ORAL | Status: DC
Start: 2011-11-08 — End: 2011-11-08
  Administered 2011-11-08: 10 mg via ORAL
  Filled 2011-11-08 (×2): qty 1

## 2011-11-08 MED ORDER — POTASSIUM CHLORIDE 10 MEQ PO TBCR
10.0000 meq | EXTENDED_RELEASE_TABLET | Freq: Every day | ORAL | Status: DC
  Administered 2011-11-08: 10 meq via ORAL
  Filled 2011-11-08: qty 1

## 2011-11-08 MED ORDER — GABAPENTIN 300 MG PO CAPS
300.0000 mg | ORAL_CAPSULE | Freq: Two times a day (BID) | ORAL | Status: DC
  Administered 2011-11-08 – 2011-11-12 (×9): 300 mg via ORAL
  Filled 2011-11-08 (×10): qty 1

## 2011-11-08 MED ORDER — POTASSIUM CHLORIDE 10 MEQ PO TBCR
40.0000 meq | EXTENDED_RELEASE_TABLET | Freq: Every day | ORAL | Status: DC
  Administered 2011-11-09 – 2011-11-12 (×4): 40 meq via ORAL
  Filled 2011-11-08 (×4): qty 4

## 2011-11-08 MED ORDER — METFORMIN HCL 500 MG PO TABS
500.0000 mg | ORAL_TABLET | Freq: Two times a day (BID) | ORAL | Status: DC
  Administered 2011-11-08 – 2011-11-09 (×3): 500 mg via ORAL
  Filled 2011-11-08 (×4): qty 1

## 2011-11-08 MED ORDER — LOSARTAN POTASSIUM 50 MG PO TABS
100.0000 mg | ORAL_TABLET | Freq: Every day | ORAL | Status: DC
  Administered 2011-11-08 – 2011-11-12 (×5): 100 mg via ORAL
  Filled 2011-11-08 (×5): qty 2

## 2011-11-08 MED ORDER — HYDROCODONE-ACETAMINOPHEN 10-325 MG PO TABS
1.0000 | ORAL_TABLET | ORAL | Status: DC | PRN
  Administered 2011-11-09: 2 via ORAL
  Filled 2011-11-08 (×2): qty 2

## 2011-11-08 NOTE — ED Notes (Signed)
Called to give report, floor reports unable to locate nurse

## 2011-11-08 NOTE — ED Notes (Signed)
Attempted to call report to charge nurse, left on hold for 10 min.

## 2011-11-08 NOTE — ED Provider Notes (Addendum)
History     CSN: [id number]  Arrival date & time 11/07/11  2356   First MD Initiated Contact with Patient 11/08/11 0141      Chief Complaint  Patient presents with  . Chest Pain    (Consider location/radiation/quality/duration/timing/severity/associated sxs/prior treatment) HPI 41 year old female presents to emergency department complaining of headache ongoing for about 3 days, and onset of chest pain tonight around 7 PM. Patient has felt overall unwell with nausea. Tonight she checked her blood pressure and it was 190/105. Patient attributed her headache to her blood pressure, and took her nighttime atenolol. Patient reports chest pain is substernal nonradiating. She has had no diaphoresis. No prior history of similar chest pain. Patient reports history of lupus which normally exhibits itself in rash and pain syndromes. Patient denies any fever. No known sick contacts, no diarrhea. She has had shortness of breath and weakness on the left side of her body but overall weak as well. Patient also reports intermittent coughing for the last 2 weeks Past Medical History  Diagnosis Date  . IDDM 08/09/2010  . Bell's palsy 08/09/2010  . SLE 08/09/2010  . SJOGREN'S SYNDROME 08/09/2010  . Hypertension     Past Surgical History  Procedure Date  . Hernia repair   . Ectopic pregnancy surgery     Family History  Problem Relation Age of Onset  . Diabetes Mother   . Hypertension Other   . Stroke Other   . Arthritis Other     History  Substance Use Topics  . Smoking status: Former Smoker    Quit date: 08/01/1980  . Smokeless tobacco: Not on file  . Alcohol Use: No    OB History    Grav Para Term Preterm Abortions TAB SAB Ect Mult Living                  Review of Systems  All other systems reviewed and are negative.    Allergies  Oxycodone-acetaminophen  Home Medications   Current Outpatient Rx  Name Route Sig Dispense Refill  . ALENDRONATE SODIUM 70 MG PO TABS Oral Take 70 mg  by mouth every 7 (seven) days. Saturdays. Take with a full glass of water on an empty stomach.    . ALPRAZOLAM 1 MG PO TABS Oral Take 1 mg by mouth 2 (two) times daily.      . ATENOLOL 100 MG PO TABS Oral Take 100 mg by mouth at bedtime.      . AZATHIOPRINE 50 MG PO TABS Oral Take 50 mg by mouth 2 (two) times daily.     . FUROSEMIDE 40 MG PO TABS Oral Take 40 mg by mouth daily.     Marland Kitchen GABAPENTIN 300 MG PO CAPS Oral Take 300 mg by mouth 2 (two) times daily.     Marland Kitchen GLIPIZIDE 10 MG PO TABS Oral Take 10 mg by mouth 2 (two) times daily.    Marland Kitchen HYDROXYCHLOROQUINE SULFATE 200 MG PO TABS Oral Take 200 mg by mouth daily.     . INSULIN ASPART 100 UNIT/ML Martin SOLN  Inject into the skin 3 times daily (just before each meal) 40-50-60 units 75 mL 4  . LOSARTAN POTASSIUM 100 MG PO TABS Oral Take 100 mg by mouth daily.      Marland Kitchen METFORMIN HCL 500 MG PO TABS Oral Take 500 mg by mouth 2 (two) times daily with a meal.      . POTASSIUM CHLORIDE 10 MEQ PO TBCR Oral Take 10 mEq by  mouth daily.      Marland Kitchen PRAVASTATIN SODIUM 10 MG PO TABS Oral Take 10 mg by mouth at bedtime.      Marland Kitchen PREDNISONE 10 MG PO TABS Oral Take 10 mg by mouth daily.      BP 115/59  Pulse 85  Temp(Src) 99.5 F (37.5 C) (Oral)  Resp 22  Wt 397 lb 4.8 oz (180.214 kg)  SpO2 100%  LMP 08/22/2011  Physical Exam  Nursing note and vitals reviewed. Constitutional: She is oriented to person, place, and time. She appears well-developed and well-nourished.       Morbidly obese uncomfortable appearing lethargic  HENT:  Head: Normocephalic and atraumatic.  Nose: Nose normal.  Mouth/Throat: Oropharynx is clear and moist.  Eyes: Conjunctivae and EOM are normal. Pupils are equal, round, and reactive to light.  Neck: Normal range of motion. Neck supple. No JVD present. No tracheal deviation present. No thyromegaly present.  Cardiovascular: Normal rate, regular rhythm, normal heart sounds and intact distal pulses.  Exam reveals no gallop and no friction rub.   No  murmur heard. Pulmonary/Chest: Effort normal. No stridor. No respiratory distress. She has no wheezes. She has no rales. She exhibits no tenderness.       Decreased lung sounds in bases  Abdominal: Soft. Bowel sounds are normal. She exhibits no distension and no mass. There is no tenderness. There is no rebound and no guarding.  Musculoskeletal: Normal range of motion. She exhibits edema (2+ pitting edema in lower extremities). She exhibits no tenderness.  Lymphadenopathy:    She has no cervical adenopathy.  Neurological: She is oriented to person, place, and time. She has normal reflexes. No cranial nerve deficit. She exhibits normal muscle tone. Coordination normal.  Skin: Skin is dry. No rash noted. No erythema. No pallor.  Psychiatric: She has a normal mood and affect. Her behavior is normal. Judgment and thought content normal.    ED Course  Procedures (including critical care time)  Labs Reviewed  CBC - Abnormal; Notable for the following:    WBC 16.7 (*)    Hemoglobin 11.4 (*)    HCT 35.9 (*)    All other components within normal limits  BASIC METABOLIC PANEL - Abnormal; Notable for the following:    Glucose, Bld 103 (*)    All other components within normal limits  PRO B NATRIURETIC PEPTIDE - Abnormal; Notable for the following:    Pro B Natriuretic peptide (BNP) 229.8 (*)    All other components within normal limits  PRO B NATRIURETIC PEPTIDE - Abnormal; Notable for the following:    Pro B Natriuretic peptide (BNP) 209.9 (*)    All other components within normal limits  D-DIMER, QUANTITATIVE  CARDIAC PANEL(CRET KIN+CKTOT+MB+TROPI)  MAGNESIUM  PHOSPHORUS  CARDIAC PANEL(CRET KIN+CKTOT+MB+TROPI)  TSH  CARDIAC PANEL(CRET KIN+CKTOT+MB+TROPI)  CARDIAC PANEL(CRET KIN+CKTOT+MB+TROPI)  HEMOGLOBIN A1C   Dg Chest 2 View  11/08/2011  *RADIOLOGY REPORT*  Clinical Data: Upper chest pain and shortness of breath; history of lupus.  CHEST - 2 VIEW  Comparison: Chest radiograph  performed 08/24/2011  Findings: The lungs are well-aerated.  Bibasilar opacities are noted, with underlying vascular congestion; this could reflect mild interstitial edema, or possibly pneumonia.  There is no evidence of pleural effusion or pneumothorax.  The heart is borderline enlarged; the mediastinal contour is within normal limits.  No acute osseous abnormalities are seen.  IMPRESSION: Bibasilar airspace opacities, with underlying vascular congestion and borderline cardiomegaly; this could reflect mild interstitial edema, or possibly  pneumonia.  Original Report Authenticated By: Tonia Ghent, M.D.     Date: 11/08/2011  Rate: 89  Rhythm: normal sinus rhythm  QRS Axis: normal  Intervals: normal  ST/T Wave abnormalities: nonspecific T wave changes  Conduction Disutrbances:none  Narrative Interpretation: frequent PVCs  Old EKG Reviewed: none available   1. CAP (community acquired pneumonia)   2. Weakness       MDM  41 year old female with chest pain, headache and generalized weakness. Workup will include EKG, pain control, lab work and chest x-ray     Patient with elevated white count and chest x-ray suggestive of pneumonia. Will treat for 20 acquired pneumonia. Attempted to get patient up and ambulating to check pulse ox, the patient refused saying that she was too weak. Will discuss with hospitalist for a mission in light of her treatment for Sjogren's and lupus with imuran and plaquenil and steroids     Olivia Mackie, MD 11/08/11 1610  Olivia Mackie, MD 11/08/11 [phone]

## 2011-11-08 NOTE — ED Notes (Signed)
Pt unable to ambulate due to "room spinning and nausea".

## 2011-11-08 NOTE — ED Notes (Signed)
Pt states she started having chest pain this evening around 7pm  Pt states she had one episode of vomiting  Pt is also c/o headache

## 2011-11-08 NOTE — Progress Notes (Addendum)
CBG: CBG - 57  Treatment: 15 GM carbohydrate snack  Symptoms: Hungry  Follow-up CBG: Time:10:07 PM  CBG Result:89  Possible Reasons for Event: Inadequate meal intake  Comments/MD notified:pt given snack x 2    Buel Ream

## 2011-11-08 NOTE — H&P (Signed)
PCP:  Ron Parker, MD, MD   DOA:  11/08/2011 12:00 AM  Chief Complaint:  Chest pain and headache  HPI: Patient is a 41 year old female who presents to emergency department with main concern of progressively worsening headache that initially started 3 days prior to admission and was associated with substernal chest pain and nausea. She describes the headaches as generalized and feels as if this is secondary to high blood pressure, which she checked and was 190/105. She reports being compliant with her blood pressure medications. She describes chest pain as sharp, nonradiating, intermittent in nature, 7/10 in severity when present with no specific aggravating or alleviating factors however, associated with nausea as noted above. Patient also reports history of lupus but she does not think her symptoms are related to the flare as she usually exhibits rash and generalized pain during the flare episodes. Patient denies fevers or chills, no known sick contacts or exposures, no abdominal or urinary concerns. She does report intermittent shortness of breath with nonproductive cough, and also reports the chest pain is worse with cough. She denies similar episodes in the past. She reports the cough has been present for 2 weeks.  Allergies: Allergies  Allergen Reactions  . Oxycodone-Acetaminophen     Prior to Admission medications   Medication Sig Start Date End Date Taking? Authorizing Provider  alendronate (FOSAMAX) 70 MG tablet Take 70 mg by mouth every 7 (seven) days. Saturdays. Take with a full glass of water on an empty stomach.   Yes Historical Provider, MD  ALPRAZolam Prudy Feeler) 1 MG tablet Take 1 mg by mouth 2 (two) times daily.     Yes Historical Provider, MD  atenolol (TENORMIN) 100 MG tablet Take 100 mg by mouth at bedtime.     Yes Historical Provider, MD  azaTHIOprine (IMURAN) 50 MG tablet Take 50 mg by mouth 2 (two) times daily.    Yes Historical Provider, MD  furosemide (LASIX) 40 MG  tablet Take 40 mg by mouth daily.    Yes Historical Provider, MD  gabapentin (NEURONTIN) 300 MG capsule Take 300 mg by mouth 2 (two) times daily.    Yes Historical Provider, MD  glipiZIDE (GLUCOTROL) 10 MG tablet Take 10 mg by mouth 2 (two) times daily.   Yes Historical Provider, MD  hydroxychloroquine (PLAQUENIL) 200 MG tablet Take 200 mg by mouth daily.    Yes Historical Provider, MD  insulin aspart (NOVOLOG FLEXPEN) 100 UNIT/ML injection Inject into the skin 3 times daily (just before each meal) 40-50-60 units 09/23/11  Yes Romero Belling, MD  losartan (COZAAR) 100 MG tablet Take 100 mg by mouth daily.     Yes Historical Provider, MD  metFORMIN (GLUCOPHAGE) 500 MG tablet Take 500 mg by mouth 2 (two) times daily with a meal.     Yes Historical Provider, MD  potassium chloride (KLOR-CON) 10 MEQ CR tablet Take 10 mEq by mouth daily.     Yes Historical Provider, MD  pravastatin (PRAVACHOL) 10 MG tablet Take 10 mg by mouth at bedtime.     Yes Historical Provider, MD  predniSONE (DELTASONE) 10 MG tablet Take 10 mg by mouth daily.   Yes Historical Provider, MD    Past Medical History  Diagnosis Date  . IDDM 08/09/2010  . Bell's palsy 08/09/2010  . SLE 08/09/2010  . SJOGREN'S SYNDROME 08/09/2010  . Hypertension     Past Surgical History  Procedure Date  . Hernia repair   . Ectopic pregnancy surgery     Social History:  reports that she quit smoking about 31 years ago. She does not have any smokeless tobacco history on file. She reports that she does not drink alcohol or use illicit drugs.  Family History  Problem Relation Age of Onset  . Diabetes Mother   . Hypertension Other   . Stroke Other   . Arthritis Other     Review of Systems:  Constitutional: Denies fever, chills, diaphoresis, appetite change. HEENT: Denies photophobia, eye pain, redness, hearing loss, ear pain, congestion, sore throat, rhinorrhea, sneezing, mouth sores, trouble swallowing, neck pain, neck stiffness and tinnitus.     Respiratory: per HPI   Cardiovascular: Denies palpitations and leg swelling.  Gastrointestinal: Denies nausea, abdominal pain, diarrhea, constipation, blood in stool and abdominal distention.  Genitourinary: Denies dysuria, urgency, frequency, hematuria, flank pain and difficulty urinating.  Musculoskeletal: Denies myalgias, back pain, joint swelling, arthralgias and gait problem.  Skin: Denies pallor, rash and wound.  Neurological: Denies dizziness, seizures, syncope, light-headedness, numbness and headaches.  Hematological: Denies adenopathy. Easy bruising, personal or family bleeding history  Psychiatric/Behavioral: Denies suicidal ideation, mood changes, confusion, nervousness, sleep disturbance and agitation  Physical Exam:  Filed Vitals:   11/08/11 0445 11/08/11 0545 11/08/11 0622 11/08/11 0630  BP:   119/56 111/41  Pulse: 80 84 88 85  Temp:      TempSrc:      Resp: 19 21 24 19   Weight:      SpO2: 97% 98% 100% 99%    Constitutional: Vital signs reviewed.  Patient is in no acute distress and cooperative with exam. Alert and oriented x3.  Head: Normocephalic and atraumatic Ear: TM normal bilaterally Mouth: no erythema or exudates, dry MM Eyes: PERRL, EOMI, conjunctivae normal, No scleral icterus.  Neck: Supple, Trachea midline normal ROM, No JVD, mass, thyromegaly, or carotid bruit present.  Cardiovascular: RRR, S1 normal, S2 normal, no MRG, pulses symmetric and intact bilaterally Pulmonary/Chest: CTAB, no wheezes, rales, or rhonchi Abdominal: Soft. Non-tender, non-distended, bowel sounds are normal, no masses, organomegaly, or guarding present.  GU: no CVA tenderness Musculoskeletal: No joint deformities, erythema, or stiffness, ROM full and no nontender Ext: bilateral non-pitting lower edema, no cyanosis, pulses palpable bilaterally (DP and PT) Hematology: no cervical, inginal, or axillary adenopathy.  Neurological: A&O x3, Strenght is normal and symmetric bilaterally,  cranial nerve II-XII are grossly intact except left eyelid droop, no focal motor deficit, sensory intact to light touch bilaterally.  Skin: Warm, dry and intact. No rash, cyanosis, or clubbing.  Psychiatric: Normal mood and affect. speech and behavior is normal. Judgment and thought content normal. Cognition and memory are normal.   Labs on Admission:  Results for orders placed during the hospital encounter of 11/08/11 (from the past 48 hour(s))  CBC     Status: Abnormal   Collection Time   11/08/11  3:11 AM      Component Value Range Comment   WBC 16.7 (*) 4.0 - 10.5 (K/uL)    RBC 4.13  3.87 - 5.11 (MIL/uL)    Hemoglobin 11.4 (*) 12.0 - 15.0 (g/dL)    HCT 16.1 (*) 09.6 - 46.0 (%)    MCV 86.9  78.0 - 100.0 (fL)    MCH 27.6  26.0 - 34.0 (pg)    MCHC 31.8  30.0 - 36.0 (g/dL)    RDW 04.5  40.9 - 81.1 (%)    Platelets 259  150 - 400 (K/uL)   BASIC METABOLIC PANEL     Status: Abnormal   Collection Time  11/08/11  3:11 AM      Component Value Range Comment   Sodium 141  135 - 145 (mEq/L)    Potassium 3.5  3.5 - 5.1 (mEq/L)    Chloride 105  96 - 112 (mEq/L)    CO2 28  19 - 32 (mEq/L)    Glucose, Bld 103 (*) 70 - 99 (mg/dL)    BUN 10  6 - 23 (mg/dL)    Creatinine, Ser 9.14  0.50 - 1.10 (mg/dL)    Calcium 9.5  8.4 - 10.5 (mg/dL)    GFR calc non Af Amer >90  >90 (mL/min)    GFR calc Af Amer >90  >90 (mL/min)   D-DIMER, QUANTITATIVE     Status: Normal   Collection Time   11/08/11  3:11 AM      Component Value Range Comment   D-Dimer, Quant 0.24  0.00 - 0.48 (ug/mL-FEU)   CARDIAC PANEL(CRET KIN+CKTOT+MB+TROPI)     Status: Normal   Collection Time   11/08/11  3:11 AM      Component Value Range Comment   Total CK 163  7 - 177 (U/L)    CK, MB 1.3  0.3 - 4.0 (ng/mL)    Troponin I <0.30  <0.30 (ng/mL)    Relative Index 0.8  0.0 - 2.5    PRO B NATRIURETIC PEPTIDE     Status: Abnormal   Collection Time   11/08/11  3:11 AM      Component Value Range Comment   Pro B Natriuretic peptide (BNP)  229.8 (*) 0 - 125 (pg/mL)     Radiological Exams on Admission:  CXR 11/08/2011 Bibasilar airspace opacities, with underlying vascular congestion and borderline cardiomegaly; this could reflect mild interstitial edema, or possibly pneumonia.  Assessment/Plan  Principal Problem:  *Shortness of breath - This appears to be multifactorial in nature and secondary to vascular congestion and questionable pneumonia - We will admit patient to telemetry floor and monitor vitals per floor per - Please note that BNP was slightly elevated at 229, and given leukocytosis and chest x-ray finding we will also treat patient for pneumonia - Patient is on Lasix 40 mg tablet once daily by mouth at home and we will give Lasix 40 mg IV x 1, we will watch urinary output, and will follow up on daily weights - BNP can be repeated closer to discharge to note trend - In addition will start patient on empiric antibiotics, Rocephin and Zithromax (start date 11/08/2011) - If no clinical improvement noted patient may need to have CT of the chest, given chronic immunocompromise state (please note patient has SLE and is on immunosuppressive therapy)  Active Problems:  Bell's palsy - Unclear etiology but appears chronic in nature - We'll continue to reassess   SLE - Chronic, appears to be well controlled to this point - Patient is on the following medications at home: Plaquenil, Imuran, prednisone - We will continue all 3 medications   Chest pain - Unclear etiology but likely multifactorial and secondary to musculoskeletal etiology, questionable pneumonia and vascular congestion - We will admit patient to telemetry floor, cycle cardiac enzymes - Please note that initial 12-lead EKG was unremarkable for specific acute events and unchanged from the most recent one - Will obtain TSH - For now we'll continue blood pressure control and supportive care with antiemetics and analgesia as needed - Continue statin    Leukocytosis - Likely secondary to an infectious etiology, PNA is noted on chest x-ray -  No fevers recorded since ER admission - We'll continue to monitor vitals per floor protocol - CBC in the morning   Anemia - this is mild and likely chronic in nature - CBC in AM   DM (diabetes mellitus) - Appears to be uncontrolled with most recent hemoglobin A1C of 8 - We'll continue home medication regimen for now please note the patient takes insulin NovoLog, 40 units in the morning, 50 units at noon, 60 units in the evening time   HTN (hypertension) - We'll continue home medication regimen, we'll change Lasix to IV 40 mg and will give one dose today - We'll have to reassess patient in the morning and determine if Lasix can be changed back to by mouth   Systolic CHF, acute on chronic - Please note that last 2-D echo was done in 2010 as per record review - It showed ejection fraction of 45% with diastolic grade 1 heart failure   Community acquired pneumonia - Will treat with antibiotics azithromycin and Rocephin as noted above  DVT Prophylaxis - Lovenox  Code Status - Full  Education  - test results and diagnostic studies were discussed with patient - patient verbalized the understanding - questions were answered at the bedside and contact information was provided for additional questions or concerns  Time Spent on Admission: Over 30 minutes  MAGICK-Tranae Laramie 11/08/2011, 7:17 AM  [facility] Pager # [phone] Main Office # [phone]

## 2011-11-08 NOTE — ED Notes (Signed)
Pt c/o headache and chest pressure at this time. Pt also c/o generalized pain and aches.

## 2011-11-08 NOTE — ED Notes (Signed)
EKG performed and given to ERP [facility]. Muse currently disabled no previous EKG retrieved.

## 2011-11-09 ENCOUNTER — Inpatient Hospital Stay (HOSPITAL_COMMUNITY): Payer: Medicaid Other

## 2011-11-09 DIAGNOSIS — M7989 Other specified soft tissue disorders: Secondary | ICD-10-CM

## 2011-11-09 DIAGNOSIS — I369 Nonrheumatic tricuspid valve disorder, unspecified: Secondary | ICD-10-CM

## 2011-11-09 LAB — GLUCOSE, CAPILLARY: Glucose-Capillary: 105 mg/dL — ABNORMAL HIGH (ref 70–99)

## 2011-11-09 LAB — BASIC METABOLIC PANEL
BUN: 10 mg/dL (ref 6–23)
Chloride: 98 mEq/L (ref 96–112)
GFR calc Af Amer: 80 mL/min — ABNORMAL LOW (ref >90)
Potassium: 3.9 mEq/L (ref 3.5–5.1)

## 2011-11-09 LAB — CBC
HCT: 35.8 % — ABNORMAL LOW (ref 36.0–46.0)
MCV: 85 fL (ref 78.0–100.0)
RBC: 4.21 MIL/uL (ref 3.87–5.11)
RDW: 13.4 % (ref 11.5–15.5)
WBC: 18 10*3/uL — ABNORMAL HIGH (ref 4.0–10.5)

## 2011-11-09 LAB — CARDIAC PANEL(CRET KIN+CKTOT+MB+TROPI)
CK, MB: 8.3 ng/mL (ref 0.3–4.0)
CK, MB: 8.6 ng/mL (ref 0.3–4.0)
Relative Index: 4.2 — ABNORMAL HIGH (ref 0.0–2.5)
Total CK: 206 U/L — ABNORMAL HIGH (ref 7–177)
Troponin I: <0.3 ng/mL (ref <0.30)

## 2011-11-09 MED ORDER — FUROSEMIDE 40 MG PO TABS
40.0000 mg | ORAL_TABLET | Freq: Two times a day (BID) | ORAL | Status: DC
  Filled 2011-11-09: qty 1

## 2011-11-09 MED ORDER — FUROSEMIDE 10 MG/ML IJ SOLN
40.0000 mg | Freq: Two times a day (BID) | INTRAMUSCULAR | Status: DC
  Administered 2011-11-09 – 2011-11-10 (×3): 40 mg via INTRAVENOUS
  Filled 2011-11-09 (×4): qty 4

## 2011-11-09 MED ORDER — ENOXAPARIN SODIUM 60 MG/0.6ML ~~LOC~~ SOLN
60.0000 mg | SUBCUTANEOUS | Status: DC
  Administered 2011-11-09 – 2011-11-12 (×4): 60 mg via SUBCUTANEOUS
  Filled 2011-11-09 (×4): qty 0.6

## 2011-11-09 MED ORDER — PROMETHAZINE HCL 25 MG/ML IJ SOLN
12.5000 mg | Freq: Four times a day (QID) | INTRAMUSCULAR | Status: DC | PRN
  Administered 2011-11-09 – 2011-11-10 (×3): 12.5 mg via INTRAVENOUS
  Filled 2011-11-09 (×3): qty 1

## 2011-11-09 MED ORDER — IOHEXOL 300 MG/ML  SOLN
100.0000 mL | Freq: Once | INTRAMUSCULAR | Status: AC | PRN
  Administered 2011-11-09: 100 mL via INTRAVENOUS

## 2011-11-09 NOTE — Progress Notes (Signed)
   CARE MANAGEMENT NOTE 11/09/2011  Patient:  [facility]   Account Number:  [id number]  Date Initiated:  11/09/2011  Documentation initiated by:  Lanier Clam  Subjective/Objective Assessment:   ADMITTED W/SOB,CHEST PAIN,[email or web address]     Action/Plan:   FROM HOME ALONE.HAS PCP   Anticipated DC Date:  11/11/2011   Anticipated DC Plan:  HOME/SELF CARE         Choice offered to / List presented to:             Status of service:  In process, will continue to follow Medicare Important Message given?   (If response is "NO", the following Medicare IM given date fields will be blank) Date Medicare IM given:   Date Additional Medicare IM given:    Discharge Disposition:    Per UR Regulation:  Reviewed for med. necessity/level of care/duration of stay  If discussed at Long Length of Stay Meetings, dates discussed:    Comments:  11/09/11 [facility] Rashanda Magloire RN,BSN NCM [phone]

## 2011-11-09 NOTE — Progress Notes (Signed)
*  PRELIMINARY RESULTS* Vascular Ultrasound Lower extremity venous duplex has been completed.  Preliminary findings: Bilaterally no evidence of DVT or baker's cyst.  Farrel Demark, RDMS 11/09/2011, 11:40 AM

## 2011-11-09 NOTE — Progress Notes (Signed)
  Echocardiogram 2D Echocardiogram has been performed.  Cathie Beams Deneen 11/09/2011, 12:54 PM

## 2011-11-09 NOTE — Progress Notes (Signed)
CRITICAL VALUE ALERT  Critical value received:  CK,MB - 8.3  Date of notification:  11/09/2011   Time of notification:  12:11 AM   Critical value read back:yes  Nurse who received alert:  B.Clelia Croft  MD notified (1st page):  Schorr  Time of first page:  12:12 AM   MD notified (2nd page): Schorr   Time of second page: 3:20 AM   Responding MD:  Schorr  Time MD responded:  3:20 AM

## 2011-11-09 NOTE — Progress Notes (Addendum)
Subjective: Reports headache improved but feels "lightheaded" when getting up. Denies CP and SOB  Objective: Vital signs Filed Vitals:   11/08/11 1606 11/08/11 2128 11/09/11 0500 11/09/11 0701  BP: 133/84 158/83  143/89  Pulse: 81 82  84  Temp: 98.2 F (36.8 C) 98.1 F (36.7 C)  98 F (36.7 C)  TempSrc: Oral Oral  Oral  Resp: 20 20  20   Height: 5\' 6"  (1.676 m)     Weight: 180 kg (396 lb 13.3 oz)  164.4 kg (362 lb 7 oz)   SpO2: 97% 93%  94%   Weight change: -0.214 kg (-7.6 oz) Last BM Date: 11/06/11  Intake/Output from previous day: 04/09 0701 - 04/10 0700 In: 50 [IV Piggyback:50] Out: 1 [Emesis/NG output:1] Total I/O In: 240 [P.O.:240] Out: 300 [Urine:300]   Physical Exam: General: Alert, awake, oriented x3, in no acute distress. HEENT: No bruits, no goiter. Mucus membranes mouth moist/pink. Left eyelid droop. Neck supple, no JVD Heart: Regular rate and rhythm, without murmurs, rubs, gallops. Lungs: Mild to moderate increased work of breathing with conversation. Breath sounds distant but  clear to auscultation bilaterally. No wheeze.  Abdomen:Morbidly obese. Soft, nontender, nondistended, positive bowel sounds. Extremities: No clubbing cyanosis with positive pedal pulses. 1+ LEE Neuro: Grossly intact, nonfocal. Speech clear.     Lab Results: Basic Metabolic Panel:  Basename 11/09/11 0456 11/08/11 0820 11/08/11 0311  NA 138 -- 141  K 3.9 -- 3.5  CL 98 -- 105  CO2 28 -- 28  GLUCOSE 99 -- 103*  BUN 10 -- 10  CREATININE 1.00 -- 0.77  CALCIUM 9.3 -- 9.5  MG -- 1.7 --  PHOS -- 3.9 --   Liver Function Tests: No results found for this basename: AST:2,ALT:2,ALKPHOS:2,BILITOT:2,PROT:2,ALBUMIN:2 in the last 72 hours No results found for this basename: LIPASE:2,AMYLASE:2 in the last 72 hours No results found for this basename: AMMONIA:2 in the last 72 hours CBC:  Basename 11/09/11 0715 11/08/11 0311  WBC 18.0* 16.7*  NEUTROABS -- --  HGB 11.6* 11.4*  HCT 35.8*  35.9*  MCV 85.0 86.9  PLT 281 259   Cardiac Enzymes:  Basename 11/09/11 0715 11/08/11 2307 11/08/11 1542  CKTOTAL 206* 190* 125  CKMB 8.6* 8.3* 1.1  CKMBINDEX -- -- --  TROPONINI <0.30 <0.30 <0.30   BNP:  Basename 11/08/11 0820 11/08/11 0311  PROBNP 209.9* 229.8*   D-Dimer:  Basename 11/08/11 0311  DDIMER 0.24   CBG:  Basename 11/09/11 0754 11/09/11 0325 11/09/11 0005 11/08/11 2202 11/08/11 2102 11/08/11 2039  GLUCAP 112* 116* 106* 89 62* 57*   Hemoglobin A1C:  Basename 11/08/11 0820  HGBA1C 8.4*   Fasting Lipid Panel: No results found for this basename: CHOL,HDL,LDLCALC,TRIG,CHOLHDL,LDLDIRECT in the last 72 hours Thyroid Function Tests:  Basename 11/08/11 0820  TSH 1.159  T4TOTAL --  FREET4 --  T3FREE --  THYROIDAB --   Anemia Panel: No results found for this basename: VITAMINB12,FOLATE,FERRITIN,TIBC,IRON,RETICCTPCT in the last 72 hours Coagulation: No results found for this basename: LABPROT:2,INR:2 in the last 72 hours Urine Drug Screen: Drugs of Abuse  No results found for this basename: labopia, cocainscrnur, labbenz, amphetmu, thcu, labbarb    Alcohol Level: No results found for this basename: ETH:2 in the last 72 hours Urinalysis: No results found for this basename: COLORURINE:2,APPERANCEUR:2,LABSPEC:2,PHURINE:2,GLUCOSEU:2,HGBUR:2,BILIRUBINUR:2,KETONESUR:2,PROTEINUR:2,UROBILINOGEN:2,NITRITE:2,LEUKOCYTESUR:2 in the last 72 hours Misc. Labs:  No results found for this or any previous visit (from the past 240 hour(s)).  Studies/Results: Dg Chest 2 View  11/08/2011  *RADIOLOGY REPORT*  Clinical  Data: Upper chest pain and shortness of breath; history of lupus.  CHEST - 2 VIEW  Comparison: Chest radiograph performed 08/24/2011  Findings: The lungs are well-aerated.  Bibasilar opacities are noted, with underlying vascular congestion; this could reflect mild interstitial edema, or possibly pneumonia.  There is no evidence of pleural effusion or pneumothorax.   The heart is borderline enlarged; the mediastinal contour is within normal limits.  No acute osseous abnormalities are seen.  IMPRESSION: Bibasilar airspace opacities, with underlying vascular congestion and borderline cardiomegaly; this could reflect mild interstitial edema, or possibly pneumonia.  Original Report Authenticated By: Tonia Ghent, M.D.    Medications: Scheduled Meds:   . ALPRAZolam  1 mg Oral BID  . atenolol  100 mg Oral QHS  . azaTHIOprine  50 mg Oral BID  . azithromycin (ZITHROMAX) 500 MG IVPB  500 mg Intravenous Once  . azithromycin  500 mg Intravenous Q24H  . cefTRIAXone (ROCEPHIN)  IV  1 g Intravenous Once  . cefTRIAXone (ROCEPHIN)  IV  1 g Intravenous Q24H  . enoxaparin (LOVENOX) injection  60 mg Subcutaneous Q24H  . furosemide  40 mg Intravenous Daily  . gabapentin  300 mg Oral BID  . hydroxychloroquine  200 mg Oral Daily  . insulin aspart  0-9 Units Subcutaneous Q4H  . insulin aspart  40 Units Subcutaneous Q breakfast  . insulin aspart  50 Units Subcutaneous Q1200  . insulin aspart  60 Units Subcutaneous QPM  . losartan  100 mg Oral Daily  . metFORMIN  500 mg Oral BID WC  . pneumococcal 23 valent vaccine  0.5 mL Intramuscular Tomorrow-1000  . potassium chloride  40 mEq Oral Daily  . predniSONE  10 mg Oral Daily  . promethazine  12.5 mg Intravenous Once  . simvastatin  20 mg Oral q1800  . sodium chloride  3 mL Intravenous Q12H  . DISCONTD: enoxaparin  30 mg Subcutaneous Q24H  . DISCONTD: furosemide  40 mg Oral Daily  . DISCONTD: glipiZIDE  10 mg Oral BID WC  . DISCONTD: insulin aspart  40 Units Subcutaneous TID WC  . DISCONTD: potassium chloride  10 mEq Oral Daily   Continuous Infusions:  PRN Meds:.acetaminophen, HYDROcodone-acetaminophen, ondansetron (ZOFRAN) IV, ondansetron  Assessment/Plan:  Principal Problem:  *Shortness of breath Active Problems:  Bell's palsy  SLE  Chest pain  Leukocytosis  Anemia  DM (diabetes mellitus)  HTN  (hypertension)  Systolic CHF, acute on chronic  Community acquired pneumonia *Shortness of breath Not much improvement.  This appears to be multifactorial in nature and secondary to vascular congestion and questionable pneumonia . Please note that BNP was slightly elevated at 229, and given leukocytosis (of note pt on daily prednisone) and chest x-ray finding we will also treat patient for pneumonia . Continue lasix and azithromycin and rocephin day #2. Strict I&O's and daily wt. BNP 209- If no clinical improvement noted patient may need to have CT of the chest, given chronic immunocompromise state (please note patient has SLE and is on immunosuppressive therapy)  Active Problems:  Bell's palsy  - Unclear etiology but appears chronic in nature . At baseline.   SLE  - Chronic, appears to be well controlled to this point  - Patient is on the following medications at home: Plaquenil, Imuran, prednisone  - We will continue all 3 medications  Chest pain  - Unclear etiology but likely multifactorial and secondary to musculoskeletal etiology, questionable pneumonia and vascular congestion . Improved. CE with neg troponins ,elevated CK but  trending down.  Please note that initial 12-lead EKG was unremarkable for specific acute events and unchanged from the most recent one . TSH 1.15 Tele with SR.  For now we'll continue blood pressure control and supportive care with antiemetics and analgesia as needed .- Continue statin  Leukocytosis  - Likely secondary to an infectious etiology, PNA is noted on chest x-ray in setting of daily prednisone use.  No fevers recorded since ER admission . Non toxic appearing. Will monitor.  Anemia  - this is mild and likely chronic in nature . No signs blding. Will monitor.   DM (diabetes mellitus)  Uncontrolled with most recent hemoglobin A1C of 8 . CBG range 57-124. We'll continue home medication regimen for now please note the patient takes insulin NovoLog, 40 units in the  morning, 50 units at noon, 60 units in the evening time  HTN (hypertension)  - We'll continue home medication regimen, we'll change Lasix to po. Will monitor closely.   Systolic CHF, acute on chronic  - Please note that last 2-D echo was done in 2010 as per record review  - It showed ejection fraction of 45% with diastolic grade 1 heart failure  Community acquired pneumonia  - Will treat with antibiotics azithromycin and Rocephin as noted above  DVT Prophylaxis - Lovenox  Code Status - Full       LOS: 1 day   [facility] M 11/09/2011, 9:48 AM  Pt seen and examined, agree with Toya Smothers NP's note Dyspnea appears multifactorial Need to r/o PE, given pleuritic CP and multiple risk factors, check CTA and dopplers Treat bronchitis/Pneumonia with Rocephin/zithromax For Systolic CHF-check pro-BNP, ECHO, change lasix to IV Could also have pulm HTN, also needs sleep study as outpt  Zannie Cove MD [facility] [phone]

## 2011-11-10 LAB — CBC
HCT: 37.3 % (ref 36.0–46.0)
Hemoglobin: 11.8 g/dL — ABNORMAL LOW (ref 12.0–15.0)
MCH: 27 pg (ref 26.0–34.0)
MCV: 85.4 fL (ref 78.0–100.0)
RBC: 4.37 MIL/uL (ref 3.87–5.11)

## 2011-11-10 LAB — GLUCOSE, CAPILLARY
Glucose-Capillary: 154 mg/dL — ABNORMAL HIGH (ref 70–99)
Glucose-Capillary: 177 mg/dL — ABNORMAL HIGH (ref 70–99)
Glucose-Capillary: 157 mg/dL — ABNORMAL HIGH (ref 70–99)
Glucose-Capillary: 146 mg/dL — ABNORMAL HIGH (ref 70–99)
Glucose-Capillary: 281 mg/dL — ABNORMAL HIGH (ref 70–99)

## 2011-11-10 LAB — BASIC METABOLIC PANEL
BUN: 13 mg/dL (ref 6–23)
CO2: 30 mEq/L (ref 19–32)
Calcium: 9.1 mg/dL (ref 8.4–10.5)
Creatinine, Ser: 0.94 mg/dL (ref 0.50–1.10)
Glucose, Bld: 118 mg/dL — ABNORMAL HIGH (ref 70–99)

## 2011-11-10 MED ORDER — FUROSEMIDE 40 MG PO TABS
40.0000 mg | ORAL_TABLET | Freq: Two times a day (BID) | ORAL | Status: DC
  Administered 2011-11-10 – 2011-11-12 (×3): 40 mg via ORAL
  Filled 2011-11-10 (×5): qty 1

## 2011-11-10 MED ORDER — AMOXICILLIN-POT CLAVULANATE 875-125 MG PO TABS
1.0000 | ORAL_TABLET | Freq: Two times a day (BID) | ORAL | Status: DC
  Administered 2011-11-10 – 2011-11-12 (×5): 1 via ORAL
  Filled 2011-11-10 (×6): qty 1

## 2011-11-10 NOTE — Progress Notes (Signed)
Pt walked approx. 59ft with 02 sat 13f 92-98% on RA. Pt has to rest & stop in the middle of walking due to SOB.

## 2011-11-10 NOTE — Progress Notes (Signed)
Subjective: Reports feeling "weak and tired". Breathing "a little better".   Objective: Vital signs Filed Vitals:   11/09/11 0701 11/09/11 1633 11/09/11 2104 11/10/11 0504  BP: 143/89 141/92 158/88 111/64  Pulse: 84 82 95 80  Temp: 98 F (36.7 C) 98.1 F (36.7 C) 98.4 F (36.9 C) 98.3 F (36.8 C)  TempSrc: Oral Oral Oral Oral  Resp: 20 20 20 20   Height:      Weight:    160.528 kg (353 lb 14.4 oz)  SpO2: 94% 94% 95% 94%   Weight change: -19.472 kg (-42 lb 14.9 oz) Last BM Date: 11/06/11  Intake/Output from previous day: 04/10 0701 - 04/11 0700 In: 770 [P.O.:720; IV Piggyback:50] Out: 3550 [Urine:3150; Emesis/NG output:400]     Physical Exam: General: Alert, awake, oriented x3, in no acute distress. HEENT: No bruits, no goiter. Heart: Regular rate and rhythm, without murmurs, rubs, gallops. Mucus membranes mouth moist/pink. PERRL Lungs:Normal effort. Breath sounds with fine crackles bases, otherwise, no wheeze, Abdomen:Morbidly obese, Soft, nontender, nondistended, positive bowel sounds. Extremities: No clubbing cyanosis or edema with positive pedal pulses. Trace LEE Neuro: Grossly intact, nonfocal.    Lab Results: Basic Metabolic Panel:  Basename 11/10/11 0450 11/09/11 0456 11/08/11 0820  NA 140 138 --  K 3.6 3.9 --  CL 101 98 --  CO2 30 28 --  GLUCOSE 118* 99 --  BUN 13 10 --  CREATININE 0.94 1.00 --  CALCIUM 9.1 9.3 --  MG -- -- 1.7  PHOS -- -- 3.9   Liver Function Tests: No results found for this basename: AST:2,ALT:2,ALKPHOS:2,BILITOT:2,PROT:2,ALBUMIN:2 in the last 72 hours No results found for this basename: LIPASE:2,AMYLASE:2 in the last 72 hours No results found for this basename: AMMONIA:2 in the last 72 hours CBC:  Basename 11/10/11 0450 11/09/11 0715  WBC 14.1* 18.0*  NEUTROABS -- --  HGB 11.8* 11.6*  HCT 37.3 35.8*  MCV 85.4 85.0  PLT 289 281   Cardiac Enzymes:  Basename 11/09/11 0715 11/08/11 2307 11/08/11 1542  CKTOTAL 206* 190* 125   CKMB 8.6* 8.3* 1.1  CKMBINDEX -- -- --  TROPONINI <0.30 <0.30 <0.30   BNP:  Basename 11/08/11 0820 11/08/11 0311  PROBNP 209.9* 229.8*   D-Dimer:  Basename 11/08/11 0311  DDIMER 0.24   CBG:  Basename 11/10/11 0734 11/10/11 0421 11/10/11 0009 11/09/11 2022 11/09/11 1639 11/09/11 1144  GLUCAP 157* 140* 154* 177* 105* 121*   Hemoglobin A1C:  Basename 11/08/11 0820  HGBA1C 8.4*   Fasting Lipid Panel: No results found for this basename: CHOL,HDL,LDLCALC,TRIG,CHOLHDL,LDLDIRECT in the last 72 hours Thyroid Function Tests:  Basename 11/08/11 0820  TSH 1.159  T4TOTAL --  FREET4 --  T3FREE --  THYROIDAB --   Anemia Panel: No results found for this basename: VITAMINB12,FOLATE,FERRITIN,TIBC,IRON,RETICCTPCT in the last 72 hours Coagulation: No results found for this basename: LABPROT:2,INR:2 in the last 72 hours Urine Drug Screen: Drugs of Abuse  No results found for this basename: labopia, cocainscrnur, labbenz, amphetmu, thcu, labbarb    Alcohol Level: No results found for this basename: ETH:2 in the last 72 hours Urinalysis: No results found for this basename: COLORURINE:2,APPERANCEUR:2,LABSPEC:2,PHURINE:2,GLUCOSEU:2,HGBUR:2,BILIRUBINUR:2,KETONESUR:2,PROTEINUR:2,UROBILINOGEN:2,NITRITE:2,LEUKOCYTESUR:2 in the last 72 hours Misc. Labs:  No results found for this or any previous visit (from the past 240 hour(s)).  Studies/Results: Ct Angio Chest W/cm &/or Wo Cm  11/09/2011  *RADIOLOGY REPORT*  Clinical Data: Chest pain, shortness of breath.  CT ANGIOGRAPHY CHEST  Technique:  Multidetector CT imaging of the chest using the standard protocol during bolus  administration of intravenous contrast. Multiplanar reconstructed images including MIPs were obtained and reviewed to evaluate the vascular anatomy.  Contrast: OMNIPAQUE IOHEXOL 300 MG/ML  SOLN  Comparison: None.  Findings: Study is limited by large body habitus.  No visible filling defects to suggest pulmonary emboli.   Cardiomegaly and vascular congestion.  Areas of atelectasis in the lower lobes bilaterally.  No effusions.  Low lung volumes.  Visualized thyroid and chest wall soft tissues unremarkable. Imaging into the upper abdomen shows no acute findings.  IMPRESSION: Study limited by body habitus.  No visible large or medium sized pulmonary emboli.  Cardiomegaly, vascular congestion.  Low lung volumes with bibasilar atelectasis.  Original Report Authenticated By: Cyndie Chime, M.D.    Medications: Scheduled Meds:   . ALPRAZolam  1 mg Oral BID  . atenolol  100 mg Oral QHS  . azaTHIOprine  50 mg Oral BID  . azithromycin  500 mg Intravenous Q24H  . cefTRIAXone (ROCEPHIN)  IV  1 g Intravenous Q24H  . enoxaparin (LOVENOX) injection  60 mg Subcutaneous Q24H  . furosemide  40 mg Intravenous Q12H  . gabapentin  300 mg Oral BID  . hydroxychloroquine  200 mg Oral Daily  . insulin aspart  0-9 Units Subcutaneous Q4H  . insulin aspart  40 Units Subcutaneous Q breakfast  . insulin aspart  50 Units Subcutaneous Q1200  . insulin aspart  60 Units Subcutaneous QPM  . losartan  100 mg Oral Daily  . pneumococcal 23 valent vaccine  0.5 mL Intramuscular Tomorrow-1000  . potassium chloride  40 mEq Oral Daily  . predniSONE  10 mg Oral Daily  . simvastatin  20 mg Oral q1800  . sodium chloride  3 mL Intravenous Q12H  . DISCONTD: furosemide  40 mg Intravenous Daily  . DISCONTD: furosemide  40 mg Oral BID  . DISCONTD: metFORMIN  500 mg Oral BID WC   Continuous Infusions:  PRN Meds:.acetaminophen, HYDROcodone-acetaminophen, iohexol, ondansetron (ZOFRAN) IV, ondansetron, promethazine  Assessment/Plan:   *Shortness of breath : multifactorial acute on chronic diastolic heart failure. Continue lasix today 2decho with EF 55-60% and grade 1 diastolic dysfunction. Volume status negative 2700.   For bronchitis vs early pneumonia, change antibiotics to PO augmentin  SLE  - Chronic, appears to be well controlled to this  point  - continue Plaquenil, Imuran, prednisone   Leukocytosis  - Likely secondary to an infectious etiology, PNA is noted on chest x-ray in setting of daily prednisone use.Trending down. No fevers recorded since ER admission . Non toxic appearing. Will monitor.   Anemia  - this is mild and likely chronic in nature . No signs blding. Will monitor.   DM (diabetes mellitus)  Uncontrolled with most recent hemoglobin A1C of 8 . CBG range 57-124. We'll continue home medication regimen for now please note the patient takes insulin NovoLog, 40 units in the morning, 50 units at noon, 60 units in the evening time   HTN (hypertension)  - We'll continue home medication regimen, we'll change Lasix to po. Will monitor closely.   Morbid obesity: this is her biggest morbidity Lifestyle modification, nutrition consult  DVT Prophylaxis - Lovenox   Code Status - Full  Dispo: Home when medically stable. Hopefully 24 -48hours .       LOS: 2 days   [facility] M 11/10/2011, 9:39 AM Pt seen and examined, Agree with above  Zannie Cove, MD [phone]

## 2011-11-10 NOTE — Plan of Care (Signed)
Problem: Food- and Nutrition-Related Knowledge Deficit (NB-1.1) Goal: Nutrition education Formal process to instruct or train a patient/client in a skill or to impart knowledge to help patients/clients voluntarily manage or modify food choices and eating behavior to maintain or improve health.  Outcome: Completed/Met Date Met:  11/10/11 Knowledge deficit resolved with diet education on 4/11.  Comments:  Discussed and provided handouts obtained from the ADA nutrition care manual, "Type II Diabetes Nutrition Therapy" and "Reducing Sodium and Cholesterol Nutrition Therapy". Instructed patient how to read food labels. Patient reported she checks her blood sugar regularly. She asked good questions. She verbalized understanding of diet education and is without any further nutrition related questions at this time. Expect Fair compliance.   RD to follow for additional nutrition needs.  Iven Finn [facility] [phone]

## 2011-11-11 LAB — GLUCOSE, CAPILLARY
Glucose-Capillary: 114 mg/dL — ABNORMAL HIGH (ref 70–99)
Glucose-Capillary: 128 mg/dL — ABNORMAL HIGH (ref 70–99)
Glucose-Capillary: 166 mg/dL — ABNORMAL HIGH (ref 70–99)

## 2011-11-11 LAB — CBC
Hemoglobin: 11.8 g/dL — ABNORMAL LOW (ref 12.0–15.0)
MCH: 27.1 pg (ref 26.0–34.0)
MCHC: 31.6 g/dL (ref 30.0–36.0)
Platelets: 331 10*3/uL (ref 150–400)
RBC: 4.36 MIL/uL (ref 3.87–5.11)

## 2011-11-11 MED ORDER — INSULIN ASPART 100 UNIT/ML ~~LOC~~ SOLN: 0.0000 [IU] | Freq: Every day | SUBCUTANEOUS | Status: DC

## 2011-11-11 MED ORDER — INSULIN ASPART 100 UNIT/ML ~~LOC~~ SOLN
0.0000 [IU] | Freq: Three times a day (TID) | SUBCUTANEOUS | Status: DC
  Administered 2011-11-12: 3 [IU] via SUBCUTANEOUS
  Administered 2011-11-12: 1 [IU] via SUBCUTANEOUS

## 2011-11-11 NOTE — Progress Notes (Signed)
   CARE MANAGEMENT NOTE 11/11/2011  Patient:  [facility]   Account Number:  [id number]  Date Initiated:  11/09/2011  Documentation initiated by:  Lanier Clam  Subjective/Objective Assessment:   ADMITTED W/SOB,CHEST PAIN,HEADACHE.IR:JJOAC     Action/Plan:   FROM HOME ALONE.HAS PCP   Anticipated DC Date:  11/12/2011   Anticipated DC Plan:  HOME W HOME HEALTH SERVICES         Choice offered to / List presented to:  C-1 Patient           Status of service:  In process, will continue to follow Medicare Important Message given?   (If response is "NO", the following Medicare IM given date fields will be blank) Date Medicare IM given:   Date Additional Medicare IM given:    Discharge Disposition:    Per UR Regulation:  Reviewed for med. necessity/level of care/duration of stay  If discussed at Long Length of Stay Meetings, dates discussed:    Comments:  11/11/11 Ira Busbin RN,BSN NCM [phone] [facility] CHOSEN FOR HHRN,F2F NEEDED. PT NOT COVERED BY MEDICAID FOR CURRENT DX.02 SATS GREATER THAN 88% THEREFORE DOES NOT QUALIFY FOR HOME 02. FOR IN HOME SLEEP STUDY,THEY WILL FAX REFERRAL FORM,& MD TO COMPLETE & FAX BACK.[id number] [phone] WILL CONTACT PATIENT TO SET UP APPT.  11/09/11 Letanya Froh RN,BSN NCM [phone]

## 2011-11-11 NOTE — Progress Notes (Signed)
Inpatient Diabetes Program Recommendations  AACE/ADA: New Consensus Statement on Inpatient Glycemic Control (2009)  Target Ranges:  Prepandial:   less than 140 mg/dL      Peak postprandial:   less than 180 mg/dL (1-2 hours)      Critically ill patients:  140 - 180 mg/dL   Reason for Visit: Hyperglycemia  Results for [name], [name] (MRN [id number]) as of 11/11/2011 10:29  Ref. Range 11/10/2011 12:13 11/10/2011 16:54 11/10/2011 20:20 11/11/2011 00:02 11/11/2011 03:47  Glucose-Capillary Latest Range: 70-99 mg/dL 782 (H) 956 (H) 213 (H) 110 (H) 114 (H)  Results for [name], [name] (MRN [id number]) as of 11/11/2011 10:29  Ref. Range 11/08/2011 08:20  Hemoglobin A1C Latest Range: <5.7 % 8.4 (H)    Inpatient Diabetes Program Recommendations Correction (SSI): Increase correction insulin to Novolog resistant tidwc and hs HgbA1C: 8.4% Outpatient Referral: OP Diabetes Education consult for HgbA1C > 7.  Note: Will follow.

## 2011-11-11 NOTE — Progress Notes (Signed)
Pt ambulated 20 ft down the hall and 20 ft back to her room for a total of 40 ft. Pt tolerated walking with minimal discomfort, however pt did have to take several breaks which were encouraged. Pt stated that she was tired but that she wanted to sit in the chair after walking. Pt sats remained 93%-97% on RA while walking.

## 2011-11-11 NOTE — Progress Notes (Signed)
   CARE MANAGEMENT NOTE 11/11/2011  Patient:  [facility]   Account Number:  [id number]  Date Initiated:  11/09/2011  Documentation initiated by:  Lanier Clam  Subjective/Objective Assessment:   ADMITTED W/SOB,CHEST PAIN,HEADACHE.ZO:XWRUE     Action/Plan:   FROM HOME ALONE.HAS PCP   Anticipated DC Date:  11/12/2011   Anticipated DC Plan:  HOME W HOME HEALTH SERVICES         Choice offered to / List presented to:  C-1 Patient           Status of service:  In process, will continue to follow Medicare Important Message given?   (If response is "NO", the following Medicare IM given date fields will be blank) Date Medicare IM given:   Date Additional Medicare IM given:    Discharge Disposition:    Per UR Regulation:  Reviewed for med. necessity/level of care/duration of stay  If discussed at Long Length of Stay Meetings, dates discussed:    Comments:  11/11/11 Alvah Lagrow RN,BSN NCM [phone] FAXED SLEEP STUDY FORM,W/CONFIRMATION TO TER[facility] PENNIX @ [facility] [phone],FAX#[phone] SLEEP CENTER FOR APPT, THEY WILL CONTACT PATIENT @ HOME TO SCHEDULE. [facility] CHOSEN FOR HHRN,F2F NEEDED. PT NOT COVERED BY MEDICAID FOR CURRENT DX.02 SATS GREATER THAN 88% THEREFORE DOES NOT QUALIFY FOR HOME 02. FOR IN HOME SLEEP STUDY,THEY WILL FAX REFERRAL FORM,& MD TO COMPLETE & FAX BACK.[phone] WILL CONTACT PATIENT TO SET UP APPT.  11/09/11 Nuh Lipton RN,BSN NCM [phone]

## 2011-11-11 NOTE — Progress Notes (Signed)
   CARE MANAGEMENT NOTE 11/11/2011  Patient:  [facility]   Account Number:  [id number]  Date Initiated:  11/09/2011  Documentation initiated by:  Lanier Clam  Subjective/Objective Assessment:   ADMITTED W/SOB,CHEST PAIN,HEADACHE.AV:[id number]     Action/Plan:   FROM HOME ALONE.HAS PCP   Anticipated DC Date:  11/12/2011   Anticipated DC Plan:  HOME W HOME HEALTH SERVICES         Choice offered to / List presented to:  C-1 Patient           Status of service:  In process, will continue to follow Medicare Important Message given?   (If response is "NO", the following Medicare IM given date fields will be blank) Date Medicare IM given:   Date Additional Medicare IM given:    Discharge Disposition:    Per UR Regulation:  Reviewed for med. necessity/level of care/duration of stay  If discussed at Long Length of Stay Meetings, dates discussed:    Comments:  11/11/11 Kambrea Carrasco RN,BSN NCM [phone] [facility] CHOSEN FOR HHRN,F2F NEEDED. PT NOT COVERED BY MEDICAID FOR CURRENT DX.02 SATS GREATER THAN 88% THEREFORE DOES NOT QUALIFY FOR HOME 02.  11/09/11 Aveion Nguyen RN,BSN NCM [phone]

## 2011-11-11 NOTE — Progress Notes (Signed)
Subjective: Breathing better  Objective: Vital signs Filed Vitals:   11/10/11 2100 11/10/11 2203 11/11/11 0430 11/11/11 0500  BP: 142/82  110/70   Pulse: 96  79   Temp:  98.8 F (37.1 C) 98.6 F (37 C)   TempSrc:  Oral Oral   Resp:  20 16   Height:      Weight:    157.7 kg (347 lb 10.7 oz)  SpO2:   96%    Weight change: -2.828 kg (-6 lb 3.8 oz) Last BM Date: 11/10/11  Intake/Output from previous day: 04/11 0701 - 04/12 0700 In: 120 [P.O.:120] Out: 1300 [Urine:1300]     Physical Exam: General: Alert, awake, oriented x3, in no acute distress. HEENT: No bruits, no goiter. Heart: Regular rate and rhythm, without murmurs, rubs, gallops. Mucus membranes mouth moist/pink. PERRL Lungs:Normal effort. Improvement airmovement, distant breath sounds. Abdomen:Morbidly obese, Soft, nontender, nondistended, positive bowel sounds. Extremities: No clubbing cyanosis or edema with positive pedal pulses. Trace LEE Neuro: Grossly intact, nonfocal.    Lab Results: Basic Metabolic Panel:  Basename 11/10/11 0450 11/09/11 0456 11/08/11 0820  NA 140 138 --  K 3.6 3.9 --  CL 101 98 --  CO2 30 28 --  GLUCOSE 118* 99 --  BUN 13 10 --  CREATININE 0.94 1.00 --  CALCIUM 9.1 9.3 --  MG -- -- 1.7  PHOS -- -- 3.9   Liver Function Tests: No results found for this basename: AST:2,ALT:2,ALKPHOS:2,BILITOT:2,PROT:2,ALBUMIN:2 in the last 72 hours No results found for this basename: LIPASE:2,AMYLASE:2 in the last 72 hours No results found for this basename: AMMONIA:2 in the last 72 hours CBC:  Basename 11/11/11 0421 11/10/11 0450  WBC 13.3* 14.1*  NEUTROABS -- --  HGB 11.8* 11.8*  HCT 37.3 37.3  MCV 85.6 85.4  PLT 331 289   Cardiac Enzymes:  Basename 11/09/11 0715 11/08/11 2307 11/08/11 1542  CKTOTAL 206* 190* 125  CKMB 8.6* 8.3* 1.1  CKMBINDEX -- -- --  TROPONINI <0.30 <0.30 <0.30   BNP:  Basename 11/08/11 0820  PROBNP 209.9*   D-Dimer: No results found for this basename:  DDIMER:2 in the last 72 hours CBG:  Basename 11/11/11 0347 11/11/11 0002 11/10/11 2020 11/10/11 1654 11/10/11 1213 11/10/11 0734  GLUCAP 114* 110* 215* 281* 146* 157*   Hemoglobin A1C:  Basename 11/08/11 0820  HGBA1C 8.4*   Fasting Lipid Panel: No results found for this basename: CHOL,HDL,LDLCALC,TRIG,CHOLHDL,LDLDIRECT in the last 72 hours Thyroid Function Tests:  Basename 11/08/11 0820  TSH 1.159  T4TOTAL --  FREET4 --  T3FREE --  THYROIDAB --   Anemia Panel: No results found for this basename: VITAMINB12,FOLATE,FERRITIN,TIBC,IRON,RETICCTPCT in the last 72 hours Coagulation: No results found for this basename: LABPROT:2,INR:2 in the last 72 hours Urine Drug Screen: Drugs of Abuse  No results found for this basename: labopia,  cocainscrnur,  labbenz,  amphetmu,  thcu,  labbarb    Alcohol Level: No results found for this basename: ETH:2 in the last 72 hours Urinalysis: No results found for this basename: COLORURINE:2,APPERANCEUR:2,LABSPEC:2,PHURINE:2,GLUCOSEU:2,HGBUR:2,BILIRUBINUR:2,KETONESUR:2,PROTEINUR:2,UROBILINOGEN:2,NITRITE:2,LEUKOCYTESUR:2 in the last 72 hours Misc. Labs:  No results found for this or any previous visit (from the past 240 hour(s)).  Studies/Results: Ct Angio Chest W/cm &/or Wo Cm  11/09/2011  *RADIOLOGY REPORT*  Clinical Data: Chest pain, shortness of breath.  CT ANGIOGRAPHY CHEST  Technique:  Multidetector CT imaging of the chest using the standard protocol during bolus administration of intravenous contrast. Multiplanar reconstructed images including MIPs were obtained and reviewed to evaluate the vascular  anatomy.  Contrast: OMNIPAQUE IOHEXOL 300 MG/ML  SOLN  Comparison: None.  Findings: Study is limited by large body habitus.  No visible filling defects to suggest pulmonary emboli.  Cardiomegaly and vascular congestion.  Areas of atelectasis in the lower lobes bilaterally.  No effusions.  Low lung volumes.  Visualized thyroid and chest wall soft  tissues unremarkable. Imaging into the upper abdomen shows no acute findings.  IMPRESSION: Study limited by body habitus.  No visible large or medium sized pulmonary emboli.  Cardiomegaly, vascular congestion.  Low lung volumes with bibasilar atelectasis.  Original Report Authenticated By: Cyndie Chime, M.D.    Medications: Scheduled Meds:    . ALPRAZolam  1 mg Oral BID  . amoxicillin-clavulanate  1 tablet Oral Q12H  . atenolol  100 mg Oral QHS  . azaTHIOprine  50 mg Oral BID  . enoxaparin (LOVENOX) injection  60 mg Subcutaneous Q24H  . furosemide  40 mg Oral BID  . gabapentin  300 mg Oral BID  . hydroxychloroquine  200 mg Oral Daily  . insulin aspart  0-9 Units Subcutaneous Q4H  . insulin aspart  40 Units Subcutaneous Q breakfast  . insulin aspart  50 Units Subcutaneous Q1200  . insulin aspart  60 Units Subcutaneous QPM  . losartan  100 mg Oral Daily  . potassium chloride  40 mEq Oral Daily  . predniSONE  10 mg Oral Daily  . simvastatin  20 mg Oral q1800  . sodium chloride  3 mL Intravenous Q12H  . DISCONTD: azithromycin  500 mg Intravenous Q24H  . DISCONTD: cefTRIAXone (ROCEPHIN)  IV  1 g Intravenous Q24H  . DISCONTD: furosemide  40 mg Intravenous Q12H   Continuous Infusions:  PRN Meds:.acetaminophen, HYDROcodone-acetaminophen, ondansetron (ZOFRAN) IV, ondansetron, promethazine  Assessment/Plan:   *Shortness of breath : multifactorial acute on chronic diastolic heart failure. Continue lasix today 2decho with EF 55-60% and grade 1 diastolic dysfunction. Volume status negative 2700.   For bronchitis vs early pneumonia, change antibiotics to PO augmentin  SLE  - Chronic, appears to be well controlled to this point  - continue Plaquenil, Imuran, prednisone   Anemia  - this is mild and likely chronic in nature . No signs blding. Will monitor.   DM (diabetes mellitus)  Uncontrolled with most recent hemoglobin A1C of 8 . CBG range 57-124. We'll continue home medication  regimen for now please note the patient takes insulin NovoLog, 40 units in the morning, 50 units at noon, 60 units in the evening time   HTN (hypertension)  -continue Cozaar  Morbid obesity: this is her biggest morbidity Lifestyle modification, nutrition consult  DVT Prophylaxis - Lovenox   Code Status - Full  Dispo: Home tomorrow .       LOS: 3 days   [name] 11/11/2011, 7:53 AM

## 2011-11-12 LAB — GLUCOSE, CAPILLARY: Glucose-Capillary: 124 mg/dL — ABNORMAL HIGH (ref 70–99)

## 2011-11-12 LAB — BASIC METABOLIC PANEL
CO2: 27 mEq/L (ref 19–32)
GFR calc non Af Amer: 75 mL/min — ABNORMAL LOW (ref >90)
Glucose, Bld: 126 mg/dL — ABNORMAL HIGH (ref 70–99)
Potassium: 4 mEq/L (ref 3.5–5.1)
Sodium: 142 mEq/L (ref 135–145)

## 2011-11-12 MED ORDER — POTASSIUM CHLORIDE 10 MEQ PO TBCR: 20.0000 meq | EXTENDED_RELEASE_TABLET | Freq: Two times a day (BID) | ORAL | Status: DC

## 2011-11-12 MED ORDER — FUROSEMIDE 40 MG PO TABS: 40.0000 mg | ORAL_TABLET | Freq: Two times a day (BID) | ORAL | Status: DC

## 2011-11-12 MED ORDER — AMOXICILLIN-POT CLAVULANATE 875-125 MG PO TABS: 1.0000 | ORAL_TABLET | Freq: Two times a day (BID) | ORAL | Status: AC

## 2011-11-12 NOTE — Evaluation (Signed)
Physical Therapy One-Time Evaluation Patient Details Name: [name] MRN: [id number] DOB: 09/26/1970 Today's Date: 11/12/2011  Problem List:  Patient Active Problem List  Diagnoses  . IDDM  . Bell's palsy  . SLE  . SJOGREN'S SYNDROME  . Shortness of breath  . Chest pain  . Leukocytosis  . Anemia  . DM (diabetes mellitus)  . HTN (hypertension)  . Systolic CHF, acute on chronic  . Community acquired pneumonia    Past Medical History:  Past Medical History  Diagnosis Date  . IDDM 08/09/2010  . Bell's palsy 08/09/2010  . SLE 08/09/2010  . SJOGREN'S SYNDROME 08/09/2010  . Hypertension    Past Surgical History:  Past Surgical History  Procedure Date  . Hernia repair   . Ectopic pregnancy surgery     PT Assessment/Plan/Recommendation PT Assessment Clinical Impression Statement: Patient is a 41 y.o. female admitted due to chest pain and SOB.  She has past h/o SLE and Bell's palsy.  She presents with decreased activity tolerance limiting mobility and will benefit from outpatient PT to improve independence/activity tolerance.  Also would improve community mobility to have rollator walker with seat.  Discussed with patient how to get letter from MD for justification for rollator.  Also gave patient information on referral for outpatient physical therapy. PT Recommendation/Assessment: All further PT needs can be met in the next venue of care PT Problem List: Decreased activity tolerance;Decreased knowledge of use of DME PT Therapy Diagnosis : Difficulty walking;Generalized weakness PT Recommendation Follow Up Recommendations: Outpatient PT Equipment Recommended: Other (comment) (Rollator walker (4 wheels with seat; bariatric)) PT Goals     PT Evaluation Precautions/Restrictions  Precautions Precautions: None Prior Functioning  Home Living Lives With: Family Available Help at Discharge: Family Type of Home: House Home Access: Stairs to enter Secretary/administrator of Steps:  3 Entrance Stairs-Rails: None Home Layout: One level Home Adaptive Equipment: Straight cane Additional Comments: States cane doesn't help her much Prior Function Level of Independence: Independent Able to Take Stairs?: Yes Cognition Cognition Arousal/Alertness: Awake/alert Overall Cognitive Status: Appears within functional limits for tasks assessed Sensation/Coordination   Extremity Assessment RLE Assessment RLE Assessment: Within Functional Limits LLE Assessment LLE Assessment: Within Functional Limits Mobility (including Balance) Bed Mobility Bed Mobility:  (patient up in recliner dressed and ready for discharge) Transfers Transfers: Yes Sit to Stand: 6: Modified independent (Device/Increase time);From chair/3-in-1 (from seat on rollator) Stand to Sit: 6: Modified independent (Device/Increase time);To chair/3-in-1 (to seat on rollator) Ambulation/Gait Ambulation/Gait: Yes Ambulation/Gait Assistance: 6: Modified independent (Device/Increase time) Ambulation Distance (Feet): 40 Feet (times two with seated rest) Assistive device: 4-wheeled walker Gait Pattern: Step-through pattern  Posture/Postural Control Posture/Postural Control: No significant limitations Exercise    End of Session PT - End of Session Activity Tolerance: Patient limited by fatigue (needed to rest due to shortness of breath) Patient left: in chair;with call bell in reach General Behavior During Session: [facility] for tasks performed Cognition: [facility] for tasks performed [facility] 11/12/2011, 3:27 PM

## 2011-11-12 NOTE — Discharge Summary (Signed)
Physician Discharge Summary  Patient ID: Garry Bochicchio MRN: [id number] DOB/AGE: 12/22/1970 41 y.o.  Admit date: 11/08/2011 Discharge date: 11/12/2011  Primary Care Physician:  Ron Parker, MD, MD   Discharge Diagnoses:   1. Acute on Chronic Diastolic CHF 2. Bronchitis/possible pneumonia 3. Morbid obesity 4. OSA 5. H/o Bell's palsy 6. SLE 7. Anemia of chronic disease 8. Diabetes mellitus) 9. HTN (hypertension) 10. Hyperlipidemia 11. OSA   Medication List  As of 11/12/2011  3:49 PM   TAKE these medications         alendronate 70 MG tablet   Commonly known as: FOSAMAX   Take 70 mg by mouth every 7 (seven) days. Saturdays. Take with a full glass of water on an empty stomach.      ALPRAZolam 1 MG tablet   Commonly known as: XANAX   Take 1 mg by mouth 2 (two) times daily.      amoxicillin-clavulanate 875-125 MG per tablet   Commonly known as: AUGMENTIN   Take 1 tablet by mouth every 12 (twelve) hours. For 3 days      atenolol 100 MG tablet   Commonly known as: TENORMIN   Take 100 mg by mouth at bedtime.      azaTHIOprine 50 MG tablet   Commonly known as: IMURAN   Take 50 mg by mouth 2 (two) times daily.      furosemide 40 MG tablet   Commonly known as: LASIX   Take 1 tablet (40 mg total) by mouth 2 (two) times daily.      gabapentin 300 MG capsule   Commonly known as: NEURONTIN   Take 300 mg by mouth 2 (two) times daily.      glipiZIDE 10 MG tablet   Commonly known as: GLUCOTROL   Take 10 mg by mouth 2 (two) times daily.      hydroxychloroquine 200 MG tablet   Commonly known as: PLAQUENIL   Take 200 mg by mouth daily.      insulin aspart 100 UNIT/ML injection   Commonly known as: novoLOG   Inject into the skin 3 times daily (just before each meal) 40-50-60 units      losartan 100 MG tablet   Commonly known as: COZAAR   Take 100 mg by mouth daily.      metFORMIN 500 MG tablet   Commonly known as: GLUCOPHAGE   Take 500 mg by mouth 2 (two) times daily  with a meal.      potassium chloride 10 MEQ CR tablet   Commonly known as: KLOR-CON   Take 2 tablets (20 mEq total) by mouth 2 (two) times daily.      pravastatin 10 MG tablet   Commonly known as: PRAVACHOL   Take 10 mg by mouth at bedtime.      predniSONE 10 MG tablet   Commonly known as: DELTASONE   Take 10 mg by mouth daily.           Disposition and Follow-up:  Dr.Jenkins in 1 week  Significant Diagnostic Studies:   1. CXR 4/9 IMPRESSION: Bibasilar airspace opacities, with underlying vascular congestion and borderline cardiomegaly; this could reflect mild interstitial edema, or possibly pneumonia.  2. CTA chest 4/10 IMPRESSION:  Study limited by body habitus.  No visible large or medium sized pulmonary emboli.  Cardiomegaly, vascular congestion. Low lung volumes with bibasilar  atelectasis.  3. 2D ECHO 11/09/11 Study Conclusions Left ventricle: The cavity size was mildly dilated. Wall thickness was increased in a  pattern of mild LVH. Systolic function was normal. The estimated ejection fraction was in the range of 55% to 60%. Doppler parameters are consistent with abnormal left ventricular relaxation (grade 1 diastolic dysfunction). Impressions: - Technically difficult; LV function appears to be preserved; focal wall motion abnormality cannot be excluded.   Brief H and P: Patient is a 41 year old female who presents to ER with headache that initially started 3 days prior to admission and was associated with substernal chest pain and Shortness of breath.  She describes chest pain as sharp, nonradiating, intermittent in nature, 7/10 in severity when present with no specific aggravating or alleviating factors however, associated with nausea as noted above.  Patient denies fevers or chills, no known sick contacts or exposures, no abdominal or urinary concerns. She does report intermittent shortness of breath with nonproductive cough, and also reports the chest pain  is worse with cough. She denies similar episodes in the past. She reports the cough has been present for 2 weeks.   Hospital Course:  Bronchitis/pneumonia and Acute diastolic CHF: Shortness of breath with pleuritic chest pain: Chest xray was concerning for bibasilar opacities with vascular congestion. She subsequently had a CTA of chest which was poor quality but ruled out large PE. Venous dopplers were negative for DVT. She improved with diuresis and antibiotics, which were then transitioned to PO. Results of echo are noted above. She was encouraged to use CPAP machine, and have repeat Sleep study as outpatient. Rest of her chronic medical problems remained stable. She was also encouraged to FU with a Rheumatologist  Time spent on Discharge:  Signed: Lovis More [facility]  11/12/2011, 3:49 PM

## 2011-11-12 NOTE — Progress Notes (Signed)
Pt had weight gain of 6.6kg overnight. On-call MD notified, no new orders received at this time. Will continue to monitor.  Daphine Deutscher, [place]

## 2011-11-14 LAB — GLUCOSE, CAPILLARY

## 2011-11-23 ENCOUNTER — Ambulatory Visit: Payer: Medicaid Other | Admitting: *Deleted

## 2011-11-24 ENCOUNTER — Ambulatory Visit (HOSPITAL_BASED_OUTPATIENT_CLINIC_OR_DEPARTMENT_OTHER): Payer: Medicaid Other | Attending: Internal Medicine | Admitting: Radiology

## 2011-11-24 VITALS — Ht 68.0 in | Wt 383.0 lb

## 2011-11-24 DIAGNOSIS — G4710 Hypersomnia, unspecified: Secondary | ICD-10-CM | POA: Insufficient documentation

## 2011-11-24 DIAGNOSIS — I4949 Other premature depolarization: Secondary | ICD-10-CM | POA: Insufficient documentation

## 2011-11-24 DIAGNOSIS — G4730 Sleep apnea, unspecified: Secondary | ICD-10-CM

## 2011-11-26 DIAGNOSIS — I4949 Other premature depolarization: Secondary | ICD-10-CM

## 2011-11-26 DIAGNOSIS — G4710 Hypersomnia, unspecified: Secondary | ICD-10-CM

## 2011-11-26 DIAGNOSIS — G4730 Sleep apnea, unspecified: Secondary | ICD-10-CM

## 2011-11-26 NOTE — Procedures (Signed)
NAMETA, FAIR                 ACCOUNT NO.:  [id number]  MEDICAL RECORD NO.:  [id number]          PATIENT TYPE:  OUT  LOCATION:  SLEEP CENTER                 FACILITY:  [facility]  PHYSICIAN:  Elverna Caffee D. Maple Hudson, MD, FCCP, FACPDATE OF BIRTH:  1970/11/04  DATE OF STUDY:  11/24/2011                           NOCTURNAL POLYSOMNOGRAM  REFERRING PHYSICIAN:  Zannie Cove, MD  REFERRING PHYSICIAN:  Zannie Cove, MD  INDICATION FOR STUDY:  Hypersomnia with sleep apnea.  EPWORTH SLEEPINESS SCORE:  12/24.  BMI 58.2, weight 383 pounds, height 68 inches, neck 19 inches.  MEDICATIONS:  Home medications are charted and reviewed.  SLEEP ARCHITECTURE:  Total sleep time 197.5 minutes with sleep efficiency 52.5%.  Stage I was 6.6%, stage II 80.3%, stage III absent, and REM 13.2% of total sleep time.  Sleep latency 171 minutes, REM latency 78.5 minutes, awake after sleep onset 8 minutes, and arousal index 45.3.  Sleep onset was not achieved until approximately 1:30 a.m.  Bedtime medications:  Atenolol, azathioprine, metolazone, hydroxychloroquine, famotidine, and gabapentin.  RESPIRATORY DATA:  Apnea-hypopnea index (AHI) 4.9 per hour.  A total of 16 events was scored including 10 obstructive apneas and 6 hypopneas. Events were not positional.  REM AHI 25.4 per hour.  Sleep onset delay and insufficient numbers of events prevented application of split protocol CPAP titration on this study night.  OXYGEN DATA:  Snoring was moderate to very loud with oxygen desaturation to a nadir of 88% and mean oxygen saturation through the study of 97.8%. She was wearing supplemental oxygen at 2 liter per minute throughout the study.  CARDIAC DATA:  Sinus rhythm with frequent PVCs including multifocal and trigeminy events.  MOVEMENT-PARASOMNIA:  No significant movement disturbance.  Bathroom x1.  IMPRESSIONS-RECOMMENDATION: 1. Sleep onset was delayed until approximately 1:30 a.m. 2. Occasional  respiratory events with sleep disturbance, within the     upper limits of normal frequency.  Apnea-hypopnea index 4.9 per     hour (the normal range for adults is from 0-5 events per hour).     Moderate to very loud snoring.  Wearing oxygen through the study at     2 L per minute, she desaturated to a nadir of 88%, but held the     mean oxygen saturation of 97.8% throughout. 3. Frequent premature ventricular contractions including trigeminy and     multifocal events.     Mirjana Tarleton D. Maple Hudson, MD, [facility], FACP Diplomate, American Board of Sleep Medicine    CDY/MEDQ  D:  11/26/2011 14:02:28  T:  11/26/2011 15:27:04  Job:  [id number]

## 2011-12-01 ENCOUNTER — Encounter: Payer: Medicaid Other | Attending: Internal Medicine | Admitting: *Deleted

## 2011-12-01 DIAGNOSIS — E669 Obesity, unspecified: Secondary | ICD-10-CM | POA: Insufficient documentation

## 2011-12-01 DIAGNOSIS — Z713 Dietary counseling and surveillance: Secondary | ICD-10-CM | POA: Insufficient documentation

## 2011-12-01 DIAGNOSIS — E119 Type 2 diabetes mellitus without complications: Secondary | ICD-10-CM | POA: Insufficient documentation

## 2011-12-01 NOTE — Patient Instructions (Addendum)
Plan: With new sodium restriction,   Cook chicken in water ahead of time and freeze chicken separately so ready to eat quickly AND freeze broth in ice cube trays so can use to  season veggies, etc without adding any sodium  Choose fresh over canned or processed to lower sodium  Read Food Labels for Total Carb (15 grams = i Carb choice) and Sodium (goal is 1500 mg/day or around 500 mg/meal) Continue with aiming for 3 Carb choices per meal to assist with BG and weight management Contact your Mail Order or local pharmacy to get Accu Chek Meter after you get Rx from MD

## 2011-12-01 NOTE — Progress Notes (Signed)
  Medical Nutrition Therapy: Appt start time: 0800 end time: 0830.   Assessment: Primary concerns today: Patient has had pneumonia and CHF since last visit, here with oxygen tank today. Not able to participate in any physical activity due to above complications. Able to verbalize understanding of carb counting but needs more information on limiting her sodium intake now.  MEDICATIONS: Dr. Everardo All increased Novolog at last visit (35 units am, 45 units lunch, 55 units, supper)  DIETARY INTAKE:  Usual eating pattern includes 3 meals and 0-1 snacks per day.  24-hr recall:  B (8 AM): oatmeal (1 pack), 2 boiled egg OR [employer] Protein Shake  Snk ( AM): N/A  L (12-1 PM): Green salad with grilled chicken (light dressing) OR 6 inch tuna subway sub  Snk ( PM): N/A  D (6-8PM): Chicken breast (grilled) w/ 1 cup of green vegetable OR Salmon patty, green beans, and 1/2 cup low-carb pasta Snk (9-11 PM): sugar-free fruit cup  Beverages: Diet green tea, water, protein shake, juice   Estimated energy needs:  1600 calories  180 g carbohydrates  120 g protein  45-50 g fat   Progress Towards Goal(s): In progress.   Nutritional Diagnosis:  [place]-3.3 Overweight/obesity As related to limited physical activity and frequent meal skipping. As evidenced by pt with BMI 57.   Intervention: reviewed her carb counting techniques, assessed sodium intake to be too high based on number of convenience items prepared and provided low sodium alternatives.  Plan: With new sodium restriction,   Cook chicken in water ahead of time and freeze chicken separately so ready to eat quickly AND freeze broth in ice cube trays so can use to  season veggies, etc without adding any sodium  Choose fresh over canned or processed to lower sodium  Read Food Labels for Total Carb (15 grams = 1 Carb choice) and Sodium (goal is 1500 mg/day or around 500 mg/meal) Continue with aiming for 3 Carb choices per meal to assist with BG and weight  management   Monitoring/Evaluation: Dietary intake, exercise, glucose control, and body weight in 6 weeks(s).

## 2011-12-07 ENCOUNTER — Ambulatory Visit (INDEPENDENT_AMBULATORY_CARE_PROVIDER_SITE_OTHER): Payer: Medicaid Other | Admitting: Endocrinology

## 2011-12-07 ENCOUNTER — Encounter: Payer: Self-pay | Admitting: Endocrinology

## 2011-12-07 VITALS — BP 136/82 | HR 90 | Temp 97.1°F | Wt 376.8 lb

## 2011-12-07 DIAGNOSIS — E109 Type 1 diabetes mellitus without complications: Secondary | ICD-10-CM

## 2011-12-07 MED ORDER — INSULIN ASPART 100 UNIT/ML ~~LOC~~ SOLN: SUBCUTANEOUS | Status: DC

## 2011-12-07 NOTE — Patient Instructions (Addendum)
blood tests are being requested for you today.  You will receive a letter with results. pending the test results, please increase novolog to three times a day (just before each meal) 60-70-80 units. Please make a follow-up appointment in 3 months. check your blood sugar 2 times a day.  vary the time of day when you check, between before the 3 meals, and at bedtime.  also check if you have symptoms of your blood sugar being too high or too low.  please keep a record of the readings and bring it to your next appointment here.  please call us sooner if you are having low blood sugar episodes, or if it stays over 200.   Stop glipizide and metformin. Please come back for a follow-up appointment in 2 weeks

## 2011-12-07 NOTE — Progress Notes (Signed)
Subjective:    Patient ID: [name], female    DOB: [birth year], 41 y.o.   MRN: [id number]  HPI Pt returns for f/u of insulin-requiring DM (dx'ed 2011).  She was recently in the hospital with pneumonia.  Prior to the hospital visit, she says cbg's were well-controlled.  She is still on prednisone.  She will see pulm in [place] next month.  no cbg record, but states cbg's vary from 200-400.  It is in general higher as the day goes on.   Past Medical History  Diagnosis Date  . IDDM 08/09/2010  . Bell's palsy 08/09/2010  . SLE 08/09/2010  . SJOGREN'S SYNDROME 08/09/2010  . Hypertension     Past Surgical History  Procedure Date  . Hernia repair   . Ectopic pregnancy surgery     History   Social History  . Marital Status: Single    Spouse Name: N/A    Number of Children: N/A  . Years of Education: N/A   Occupational History  .      student   Social History Main Topics  . Smoking status: Former Smoker    Quit date: 08/01/1980  . Smokeless tobacco: Never Used  . Alcohol Use: No  . Drug Use: No  . Sexually Active: Not Currently   Other Topics Concern  . Not on file   Social History Narrative   Regular exercise-yes    Current Outpatient Prescriptions on File Prior to Visit  Medication Sig Dispense Refill  . alendronate (FOSAMAX) 70 MG tablet Take 70 mg by mouth every 7 (seven) days. Saturdays. Take with a full glass of water on an empty stomach.      . ALPRAZolam (XANAX) 1 MG tablet Take 1 mg by mouth 2 (two) times daily.        Marland Kitchen atenolol (TENORMIN) 100 MG tablet Take 100 mg by mouth at bedtime.        Marland Kitchen azaTHIOprine (IMURAN) 50 MG tablet Take 50 mg by mouth 2 (two) times daily.       . furosemide (LASIX) 40 MG tablet Take 1 tablet (40 mg total) by mouth 2 (two) times daily.  30 tablet  0  . gabapentin (NEURONTIN) 300 MG capsule Take 300 mg by mouth 2 (two) times daily.       . hydroxychloroquine (PLAQUENIL) 200 MG tablet Take 200 mg by mouth daily.       Marland Kitchen losartan (COZAAR)  100 MG tablet Take 100 mg by mouth daily.        . potassium chloride (KLOR-CON) 10 MEQ CR tablet Take 2 tablets (20 mEq total) by mouth 2 (two) times daily.  30 tablet  0  . pravastatin (PRAVACHOL) 10 MG tablet Take 10 mg by mouth at bedtime.        . predniSONE (DELTASONE) 10 MG tablet Take 10 mg by mouth daily.        Allergies  Allergen Reactions  . Oxycodone-Acetaminophen     Family History  Problem Relation Age of Onset  . Diabetes Mother   . Hypertension Other   . Stroke Other   . Arthritis Other     BP 136/82  Pulse 90  Temp(Src) 97.1 F (36.2 C) (Oral)  Wt 376 lb 12.8 oz (170.915 kg)  SpO2 98%  LMP 11/14/2011  Review of Systems denies hypoglycemia.      Objective:   Physical Exam Pulses: dorsalis pedis intact bilat.  Feet: no deformity. no ulcer on the feet. feet are  of normal color and temp. 1+ bilat leg edema. There is bilateral onychomycosis  Neuro: sensation is intact to touch on the feet.  Lab Results  Component Value Date   HGBA1C 8.4* 11/08/2011      Assessment & Plan:  DM, exac by steroid, which she continues to require.

## 2011-12-08 ENCOUNTER — Telehealth: Payer: Self-pay

## 2011-12-08 NOTE — Telephone Encounter (Signed)
increase novolog to three times a day (just before each meal) 80-90-100 units.

## 2011-12-08 NOTE — Telephone Encounter (Signed)
Pt called stating she was advised by MD in office to call if CBGs remain elevated. Before breakfast today pt's CBG was 336 and at 3p today 415 and she report feeling extremely tired and sluggish. Pt is requesting medicine adjustment/advisement form MD.

## 2011-12-08 NOTE — Telephone Encounter (Signed)
Pt informed of MD's advisement for insulin adjustment.  

## 2011-12-21 ENCOUNTER — Ambulatory Visit (INDEPENDENT_AMBULATORY_CARE_PROVIDER_SITE_OTHER): Payer: Medicaid Other | Admitting: Endocrinology

## 2011-12-21 ENCOUNTER — Encounter: Payer: Self-pay | Admitting: Endocrinology

## 2011-12-21 VITALS — BP 124/82 | HR 93 | Temp 99.1°F | Ht 68.0 in | Wt 376.0 lb

## 2011-12-21 DIAGNOSIS — E109 Type 1 diabetes mellitus without complications: Secondary | ICD-10-CM

## 2011-12-21 MED ORDER — INSULIN ASPART 100 UNIT/ML ~~LOC~~ SOLN: SUBCUTANEOUS | Status: DC

## 2011-12-21 NOTE — Progress Notes (Signed)
Subjective:    Patient ID: [name], female    DOB: [birth year], 41 y.o.   MRN: [id number]  HPI Pt returns for f/u of insulin-requiring DM (dx'ed 2011; no known complications).  no cbg record, but states cbg's vary from the 200's to 300's. She has fatigue.  She is still on prednisone. Past Medical History  Diagnosis Date  . IDDM 08/09/2010  . Bell's palsy 08/09/2010  . SLE 08/09/2010  . SJOGREN'S SYNDROME 08/09/2010  . Hypertension     Past Surgical History  Procedure Date  . Hernia repair   . Ectopic pregnancy surgery     History   Social History  . Marital Status: Single    Spouse Name: N/A    Number of Children: N/A  . Years of Education: N/A   Occupational History  .      student   Social History Main Topics  . Smoking status: Former Smoker    Quit date: 08/01/1980  . Smokeless tobacco: Never Used  . Alcohol Use: No  . Drug Use: No  . Sexually Active: Not Currently   Other Topics Concern  . Not on file   Social History Narrative   Regular exercise-yes    Current Outpatient Prescriptions on File Prior to Visit  Medication Sig Dispense Refill  . alendronate (FOSAMAX) 70 MG tablet Take 70 mg by mouth every 7 (seven) days. Saturdays. Take with a full glass of water on an empty stomach.      . ALPRAZolam (XANAX) 1 MG tablet Take 1 mg by mouth 2 (two) times daily.        Marland Kitchen atenolol (TENORMIN) 100 MG tablet Take 100 mg by mouth at bedtime.        Marland Kitchen azaTHIOprine (IMURAN) 50 MG tablet Take 50 mg by mouth 2 (two) times daily.       . furosemide (LASIX) 40 MG tablet Take 1 tablet (40 mg total) by mouth 2 (two) times daily.  30 tablet  0  . gabapentin (NEURONTIN) 300 MG capsule Take 300 mg by mouth 2 (two) times daily.       . hydroxychloroquine (PLAQUENIL) 200 MG tablet Take 200 mg by mouth daily.       Marland Kitchen losartan (COZAAR) 100 MG tablet Take 100 mg by mouth daily.        . potassium chloride (KLOR-CON) 10 MEQ CR tablet Take 2 tablets (20 mEq total) by mouth 2 (two) times  daily.  30 tablet  0  . pravastatin (PRAVACHOL) 10 MG tablet Take 10 mg by mouth at bedtime.        . predniSONE (DELTASONE) 10 MG tablet Take 10 mg by mouth daily.        Allergies  Allergen Reactions  . Oxycodone-Acetaminophen     Family History  Problem Relation Age of Onset  . Diabetes Mother   . Hypertension Other   . Stroke Other   . Arthritis Other     BP 124/82  Pulse 93  Temp(Src) 99.1 F (37.3 C) (Oral)  Ht 5\' 8"  (1.727 m)  Wt 376 lb (170.552 kg)  BMI 57.17 kg/m2  SpO2 99%  LMP 11/14/2011  Review of Systems denies hypoglycemia    Objective:   Physical Exam VITAL SIGNS:  See vs page GENERAL: no distress.  Obese.  Has 02 on.  PSYCH: Alert and oriented x 3.  Does not appear anxious nor depressed.       Assessment & Plan:  DM.  needs increased  rx

## 2011-12-21 NOTE — Patient Instructions (Addendum)
pending the test results, please increase novolog to three times a day (just before each meal) 100-110-120 units. check your blood sugar 2 times a day.  vary the time of day when you check, between before the 3 meals, and at bedtime.  also check if you have symptoms of your blood sugar being too high or too low.  please keep a record of the readings and bring it to your next appointment here.  please call us sooner if you are having low blood sugar episodes, or if it stays over 200.   Please come back for a follow-up appointment in 1 month.

## 2012-01-12 DIAGNOSIS — G5790 Unspecified mononeuropathy of unspecified lower limb: Secondary | ICD-10-CM | POA: Insufficient documentation

## 2012-01-20 ENCOUNTER — Encounter: Payer: Medicaid Other | Attending: Internal Medicine | Admitting: *Deleted

## 2012-01-20 ENCOUNTER — Encounter: Payer: Self-pay | Admitting: *Deleted

## 2012-01-20 ENCOUNTER — Ambulatory Visit
Admission: RE | Admit: 2012-01-20 | Discharge: 2012-01-20 | Disposition: A | Discharge status: Home / Self Care | Payer: Medicaid Other | Source: Ambulatory Visit | Attending: Internal Medicine | Admitting: Internal Medicine

## 2012-01-20 ENCOUNTER — Other Ambulatory Visit: Payer: Self-pay | Admitting: Internal Medicine

## 2012-01-20 VITALS — Ht 68.0 in | Wt 389.0 lb

## 2012-01-20 DIAGNOSIS — E119 Type 2 diabetes mellitus without complications: Secondary | ICD-10-CM

## 2012-01-20 DIAGNOSIS — I509 Heart failure, unspecified: Secondary | ICD-10-CM

## 2012-01-20 DIAGNOSIS — E669 Obesity, unspecified: Secondary | ICD-10-CM | POA: Insufficient documentation

## 2012-01-20 DIAGNOSIS — Z713 Dietary counseling and surveillance: Secondary | ICD-10-CM | POA: Insufficient documentation

## 2012-01-20 NOTE — Patient Instructions (Addendum)
Plan: With new sodium restriction,  Continue to cook chicken in water ahead of time and freeze chicken separately so ready to eat quickly AND freeze broth in ice cube trays so can use to  season veggies, etc without adding any sodium Continue to choose fresh over canned or processed to lower sodium Read Food Labels for Total Carb (15 grams = 1 Carb choice) and Sodium (goal is 1500 mg/day or around 500 mg/meal) Continue with aiming for 3 Carb choices per meal to assist with BG and weight management  Continue to check BG before each meal as directed by MD  Continue to take Novolog insulin as directed by MD Consider asking MD about a basal insulin to possibly help bring down AM BGs

## 2012-01-20 NOTE — Progress Notes (Signed)
  Medical Nutrition Therapy: Appt start time: 1030 end time: 1100.   Assessment: Primary concerns today: patient here for follow up visit for obesity. She continues to need oxygen for CHF, and has difficulty getting around. She states her Metformin and Glipizide have been DC'd. She continues to take insulin with Novolog Pen. She has been successful with sodium restriction.  MEDICATIONS: Dr. Everardo All increased Novolog at last visit (100 units am, 110 units lunch, 120 units, supper)  DIETARY INTAKE:  Usual eating pattern includes 3 meals and 0-1 snacks per day.  24-hr recall:  B (8 AM): oatmeal (1 pack), 2 boiled egg OR Atkin[employer] Protein Shake  Snk ( AM): N/A  L (12-1 PM): Green salad with grilled chicken (light dressing) OR 6 inch tuna subway sub  Snk ( PM): N/A  D (6-8PM): Chicken breast (grilled) w/ 1 cup of green vegetable OR Salmon patty, green beans, and 1/2 cup low-carb pasta Snk (9-11 PM): sugar-free fruit cup  Beverages: Diet green tea, water, protein shake,   Estimated energy needs:  1600 calories  180 g carbohydrates  120 g protein  45-50 g fat   Progress Towards Goal(s): In progress.   Nutritional Diagnosis:  Ider-3.3 Overweight/obesity As related to limited physical activity and frequent meal skipping. As evidenced by pt with BMI 57.   Intervention: She continues to struggle with high BGs, her FBG today was 317 mg/dl. Encouraged her to continue with sodium restriction and food prep ideas we had discussed. Reviewed action of her current Novolog insulin and suggested she speak with her MD regarding possibility of a long acting basal insulin to help lower her over all BGs. Also reviewed Carb Counting basics  Plan: With new sodium restriction,  Continue to cook chicken in water ahead of time and freeze chicken separately so ready to eat quickly AND freeze broth in ice cube trays so can use to  season veggies, etc without adding any sodium Continue to choose fresh over canned or  processed to lower sodium Read Food Labels for Total Carb (15 grams = 1 Carb choice) and Sodium (goal is 1500 mg/day or around 500 mg/meal) Continue with aiming for 3 Carb choices per meal to assist with BG and weight management  Continue to check BG before each meal as directed by MD  Continue to take Novolog insulin as directed by MD Consider asking MD about a basal insulin to possibly help bring down AM BGs  Monitoring/Evaluation: Dietary intake, exercise, glucose control, and body weight PRN.

## 2012-01-25 ENCOUNTER — Ambulatory Visit (INDEPENDENT_AMBULATORY_CARE_PROVIDER_SITE_OTHER): Payer: Medicaid Other | Admitting: Endocrinology

## 2012-01-25 ENCOUNTER — Encounter: Payer: Self-pay | Admitting: Endocrinology

## 2012-01-25 VITALS — BP 130/88 | HR 95 | Temp 98.2°F | Ht 68.0 in | Wt 373.0 lb

## 2012-01-25 DIAGNOSIS — E119 Type 2 diabetes mellitus without complications: Secondary | ICD-10-CM

## 2012-01-25 MED ORDER — INSULIN ASPART 100 UNIT/ML ~~LOC~~ SOLN: SUBCUTANEOUS | Status: DC

## 2012-01-25 NOTE — Patient Instructions (Addendum)
please increase novolog to three times a day (just before each meal) 120-130-140 units. check your blood sugar 2 times a day.  vary the time of day when you check, between before the 3 meals, and at bedtime.  also check if you have symptoms of your blood sugar being too high or too low.  please keep a record of the readings and bring it to your next appointment here.  please call us sooner if you are having low blood sugar episodes, or if it stays over 200.   Please come back for a follow-up appointment in 1 month.

## 2012-01-25 NOTE — Progress Notes (Signed)
  Subjective:    Patient ID: [name], female    DOB: May 29, 1971, 41 y.o.   MRN: [id number]  HPI Pt returns for f/u of insulin-requiring DM (dx'ed 2011; no known complications).  no cbg record, but states cbg's vary from the 300-400.  It is in general higher as the day goes on. She has fatigue.  She is still on prednisone.  Past Medical History  Diagnosis Date  . IDDM 08/09/2010  . Bell's palsy 08/09/2010  . SLE 08/09/2010  . SJOGREN'S SYNDROME 08/09/2010  . Hypertension     Past Surgical History  Procedure Date  . Hernia repair   . Ectopic pregnancy surgery     History   Social History  . Marital Status: Single    Spouse Name: N/A    Number of Children: N/A  . Years of Education: N/A   Occupational History  .      student   Social History Main Topics  . Smoking status: Former Smoker    Quit date: 08/01/1980  . Smokeless tobacco: Never Used  . Alcohol Use: No  . Drug Use: No  . Sexually Active: Not Currently   Other Topics Concern  . Not on file   Social History Narrative   Regular exercise-yes    Current Outpatient Prescriptions on File Prior to Visit  Medication Sig Dispense Refill  . alendronate (FOSAMAX) 70 MG tablet Take 70 mg by mouth every 7 (seven) days. Saturdays. Take with a full glass of water on an empty stomach.      . ALPRAZolam (XANAX) 1 MG tablet Take 1 mg by mouth 2 (two) times daily.        Marland Kitchen atenolol (TENORMIN) 100 MG tablet Take 100 mg by mouth at bedtime.        Marland Kitchen azaTHIOprine (IMURAN) 50 MG tablet Take 50 mg by mouth 2 (two) times daily.       . furosemide (LASIX) 40 MG tablet Take 80 mg by mouth 2 (two) times daily.      Marland Kitchen gabapentin (NEURONTIN) 300 MG capsule Take 300 mg by mouth 2 (two) times daily.       . hydroxychloroquine (PLAQUENIL) 200 MG tablet Take 200 mg by mouth daily.       Marland Kitchen losartan (COZAAR) 100 MG tablet Take 100 mg by mouth daily.        . potassium chloride (KLOR-CON) 10 MEQ CR tablet Take 2 tablets (20 mEq total) by mouth 2  (two) times daily.  30 tablet  0  . pravastatin (PRAVACHOL) 10 MG tablet Take 10 mg by mouth at bedtime.        . predniSONE (DELTASONE) 10 MG tablet Take 10 mg by mouth daily.        Allergies  Allergen Reactions  . Oxycodone-Acetaminophen     Family History  Problem Relation Age of Onset  . Diabetes Mother   . Hypertension Other   . Stroke Other   . Arthritis Other     BP 130/88  Pulse 95  Temp 98.2 F (36.8 C) (Oral)  Ht 5\' 8"  (1.727 m)  Wt 373 lb (169.192 kg)  BMI 56.71 kg/m2  SpO2 98%  LMP 11/14/2011   Review of Systems denies hypoglycemia    Objective:   Physical Exam VITAL SIGNS:  See vs page GENERAL: no distress PSYCH: Alert and oriented x 3.  Does not appear anxious nor depressed.     Assessment & Plan:  DM.  needs increased rx

## 2012-02-07 ENCOUNTER — Encounter (HOSPITAL_COMMUNITY)
Admission: RE | Disposition: A | Discharge status: Home / Self Care | Payer: Self-pay | Source: Ambulatory Visit | Attending: Cardiology

## 2012-02-07 ENCOUNTER — Ambulatory Visit (HOSPITAL_COMMUNITY)
Admission: RE | Admit: 2012-02-07 | Discharge: 2012-02-08 | Disposition: A | Discharge status: Home / Self Care | Payer: Medicaid Other | Source: Ambulatory Visit | Attending: Cardiology | Admitting: Cardiology

## 2012-02-07 ENCOUNTER — Encounter (HOSPITAL_COMMUNITY): Payer: Self-pay | Admitting: Cardiology

## 2012-02-07 DIAGNOSIS — E109 Type 1 diabetes mellitus without complications: Secondary | ICD-10-CM

## 2012-02-07 DIAGNOSIS — M3500 Sicca syndrome, unspecified: Secondary | ICD-10-CM

## 2012-02-07 DIAGNOSIS — E119 Type 2 diabetes mellitus without complications: Secondary | ICD-10-CM

## 2012-02-07 DIAGNOSIS — IMO0002 Reserved for concepts with insufficient information to code with codable children: Secondary | ICD-10-CM | POA: Insufficient documentation

## 2012-02-07 DIAGNOSIS — E1165 Type 2 diabetes mellitus with hyperglycemia: Secondary | ICD-10-CM | POA: Insufficient documentation

## 2012-02-07 DIAGNOSIS — R0789 Other chest pain: Secondary | ICD-10-CM | POA: Insufficient documentation

## 2012-02-07 DIAGNOSIS — M79609 Pain in unspecified limb: Secondary | ICD-10-CM

## 2012-02-07 DIAGNOSIS — D649 Anemia, unspecified: Secondary | ICD-10-CM

## 2012-02-07 DIAGNOSIS — R0602 Shortness of breath: Secondary | ICD-10-CM | POA: Insufficient documentation

## 2012-02-07 HISTORY — PX: LEFT AND RIGHT HEART CATHETERIZATION WITH CORONARY ANGIOGRAM: SHX5449

## 2012-02-07 HISTORY — DX: Dependence on other enabling machines and devices: Z99.89

## 2012-02-07 HISTORY — DX: Obstructive sleep apnea (adult) (pediatric): G47.33

## 2012-02-07 HISTORY — DX: Pure hypercholesterolemia, unspecified: E78.00

## 2012-02-07 HISTORY — DX: Heart failure, unspecified: I50.9

## 2012-02-07 HISTORY — DX: Anxiety disorder, unspecified: F41.9

## 2012-02-07 HISTORY — DX: Cerebral infarction, unspecified: I63.9

## 2012-02-07 HISTORY — DX: Angina pectoris, unspecified: I20.9

## 2012-02-07 HISTORY — DX: Migraine, unspecified, not intractable, without status migrainosus: G43.909

## 2012-02-07 HISTORY — PX: CARDIAC CATHETERIZATION: SHX172

## 2012-02-07 LAB — GLUCOSE, CAPILLARY
Glucose-Capillary: 312 mg/dL — ABNORMAL HIGH (ref 70–99)
Glucose-Capillary: 325 mg/dL — ABNORMAL HIGH (ref 70–99)

## 2012-02-07 LAB — HEMOGLOBIN: Hemoglobin: 10.9 g/dL — ABNORMAL LOW (ref 12.0–15.0)

## 2012-02-07 LAB — HEMOGLOBIN AND HEMATOCRIT, BLOOD
HCT: 33.9 % — ABNORMAL LOW (ref 36.0–46.0)
Hemoglobin: 11.2 g/dL — ABNORMAL LOW (ref 12.0–15.0)

## 2012-02-07 SURGERY — LEFT AND RIGHT HEART CATHETERIZATION WITH CORONARY ANGIOGRAM
Anesthesia: LOCAL

## 2012-02-07 MED ORDER — HYDROMORPHONE HCL PF 2 MG/ML IJ SOLN: 0.5000 mg | INTRAMUSCULAR | Status: DC | PRN

## 2012-02-07 MED ORDER — MIDAZOLAM HCL 2 MG/2ML IJ SOLN
INTRAMUSCULAR | Status: AC
  Filled 2012-02-07: qty 2

## 2012-02-07 MED ORDER — ASPIRIN 81 MG PO CHEW
324.0000 mg | CHEWABLE_TABLET | ORAL | Status: AC
  Administered 2012-02-07: 324 mg via ORAL

## 2012-02-07 MED ORDER — HYDROMORPHONE HCL PF 2 MG/ML IJ SOLN
0.5000 mg | Freq: Once | INTRAMUSCULAR | Status: AC
  Administered 2012-02-07: 0.5 mg via INTRAVENOUS

## 2012-02-07 MED ORDER — ACETAMINOPHEN 325 MG PO TABS: 650.0000 mg | ORAL_TABLET | ORAL | Status: DC | PRN

## 2012-02-07 MED ORDER — MORPHINE SULFATE 2 MG/ML IJ SOLN
2.0000 mg | INTRAMUSCULAR | Status: DC | PRN
  Administered 2012-02-07 – 2012-02-08 (×4): 4 mg via INTRAVENOUS
  Filled 2012-02-07 (×3): qty 2

## 2012-02-07 MED ORDER — INSULIN REGULAR HUMAN 100 UNIT/ML IJ SOLN
10.0000 [IU] | Freq: Once | INTRAMUSCULAR | Status: AC
  Administered 2012-02-07: 10 [IU] via SUBCUTANEOUS
  Filled 2012-02-07: qty 0.1

## 2012-02-07 MED ORDER — SODIUM CHLORIDE 0.9 % IV SOLN: INTRAVENOUS | Status: DC

## 2012-02-07 MED ORDER — SODIUM CHLORIDE 0.9 % IJ SOLN: 3.0000 mL | Freq: Two times a day (BID) | INTRAMUSCULAR | Status: DC

## 2012-02-07 MED ORDER — INSULIN ASPART 100 UNIT/ML ~~LOC~~ SOLN: 0.0000 [IU] | Freq: Three times a day (TID) | SUBCUTANEOUS | Status: DC

## 2012-02-07 MED ORDER — ONDANSETRON HCL 4 MG/2ML IJ SOLN
4.0000 mg | Freq: Four times a day (QID) | INTRAMUSCULAR | Status: DC | PRN
  Administered 2012-02-07 – 2012-02-08 (×2): 4 mg via INTRAVENOUS
  Filled 2012-02-07 (×2): qty 2

## 2012-02-07 MED ORDER — SODIUM CHLORIDE 0.9 % IJ SOLN: 3.0000 mL | INTRAMUSCULAR | Status: DC | PRN

## 2012-02-07 MED ORDER — HYDROMORPHONE HCL PF 2 MG/ML IJ SOLN
INTRAMUSCULAR | Status: AC
  Filled 2012-02-07: qty 1

## 2012-02-07 MED ORDER — SODIUM CHLORIDE 0.9 % IV SOLN
250.0000 mL | INTRAVENOUS | Status: DC | PRN
  Administered 2012-02-07: 1000 mL via INTRAVENOUS

## 2012-02-07 MED ORDER — ASPIRIN 81 MG PO CHEW
CHEWABLE_TABLET | ORAL | Status: AC
  Filled 2012-02-07: qty 4

## 2012-02-07 MED ORDER — MORPHINE SULFATE 4 MG/ML IJ SOLN: 2.0000 mg | INTRAMUSCULAR | Status: DC | PRN

## 2012-02-07 MED ORDER — ONDANSETRON HCL 4 MG/2ML IJ SOLN: 4.0000 mg | Freq: Four times a day (QID) | INTRAMUSCULAR | Status: DC | PRN

## 2012-02-07 MED ORDER — DIAZEPAM 5 MG PO TABS: 10.0000 mg | ORAL_TABLET | Freq: Three times a day (TID) | ORAL | Status: DC | PRN

## 2012-02-07 MED ORDER — MORPHINE SULFATE 4 MG/ML IJ SOLN
INTRAMUSCULAR | Status: AC
  Filled 2012-02-07: qty 1

## 2012-02-07 MED ORDER — ACETAMINOPHEN 325 MG PO TABS
650.0000 mg | ORAL_TABLET | ORAL | Status: DC | PRN
  Administered 2012-02-07: 650 mg via ORAL
  Filled 2012-02-07: qty 2

## 2012-02-07 MED ORDER — INSULIN GLARGINE 100 UNIT/ML ~~LOC~~ SOLN
10.0000 [IU] | Freq: Every day | SUBCUTANEOUS | Status: DC
  Administered 2012-02-07: 10 [IU] via SUBCUTANEOUS

## 2012-02-07 MED ORDER — INSULIN ASPART 100 UNIT/ML ~~LOC~~ SOLN
SUBCUTANEOUS | Status: AC
  Filled 2012-02-07: qty 1

## 2012-02-07 MED ORDER — SODIUM CHLORIDE 0.9 % IV SOLN: 1.0000 mL/kg/h | INTRAVENOUS | Status: DC

## 2012-02-07 MED ORDER — INSULIN REGULAR HUMAN 100 UNIT/ML IJ SOLN
10.0000 [IU] | Freq: Once | INTRAMUSCULAR | Status: AC
  Administered 2012-02-07: 10 [IU] via SUBCUTANEOUS

## 2012-02-07 MED ORDER — HYDROMORPHONE HCL PF 1 MG/ML IJ SOLN
1.0000 mg | INTRAMUSCULAR | Status: DC | PRN
  Administered 2012-02-08: 07:00:00 1 mg via INTRAVENOUS
  Filled 2012-02-07: qty 1

## 2012-02-07 MED ORDER — MORPHINE SULFATE 4 MG/ML IJ SOLN
2.0000 mg | INTRAMUSCULAR | Status: DC | PRN
  Filled 2012-02-07: qty 1

## 2012-02-07 MED ORDER — INSULIN ASPART 100 UNIT/ML ~~LOC~~ SOLN
0.0000 [IU] | Freq: Three times a day (TID) | SUBCUTANEOUS | Status: DC
  Administered 2012-02-07: 11 [IU] via SUBCUTANEOUS
  Administered 2012-02-08: 15 [IU] via SUBCUTANEOUS
  Administered 2012-02-08: 7 [IU] via SUBCUTANEOUS

## 2012-02-07 NOTE — CV Procedure (Signed)
Procedure performed:  Left heart catheterization including hemodynamic monitoring of the left ventricle, LV Gram,  selective right and left coronary arteriography.  Indication patient is a 41 year-old woman with history of  uncontrolled Diabetes Mellitus, morbid obesity, family h/o premature CAD and auto immune disease presenting with chest pain. Hence is brought to the cardiac catheterization lab to evaluate her coronary anatomy for definitive diagnosis of CAD.  Hemodynamic data:  Left ventricular pressure was 124/23 with LVEDP of 25 mm mercury. Aortic pressure was 135/86 with a mean of 108 mm mercury.  Left ventricle: Performed in the RAO projection revealed LVEF of 60%. There was No MR. No Wall motion abnormality.  Right coronary artery: The vessel is smooth, normal, non- Dominant.  Left main coronary artery is large and normal.  Circumflex coronary artery: A large vessel giving origin to a large obtuse marginal 1.   LAD:  LAD gives origin to a large diagonal-1. Normal.  Ramus intermediate: Normal  Recommendation: Evaluation for noncardiac causes of chest pain is indicated. I suspect the shortness of breath is related to restrictive lung disease from morbid obesity. Weight loss, increasing exercise is indicated. Primary prevention strategies indicated.  Technique: I attempted to obtain right antecubital vein access via ultrasound guidance. This was performed due to morbid obesity and inability to obtain any vein access. However access obtained turned out to be right brachial artery access. I proceeded to utilize this. The puncture was performed using a micropuncture needle. Under sterile precautions using a 5 French right brachial arterial access, a 5 French sheath was introduced into the artery. A 5 [place] Tig 4 catheter was advanced into the ascending aorta selective  right coronary artery and left coronary artery was cannulated and angiography was performed in multiple views. The  catheter was pulled back Out of the body over exchange length J-wire. Right heart catheterization was not performed due to high risk of groin complications in a patient who weighs greater than 400 pounds. For shortness of breath is probably due to restrictive lung disease due to morbid obesity.  19F pigtail Catheter was used to perform LV gram which was performed in LAO projection.  Catheter exchanged out of the body over J-Wire. NO immediate complications noted. Patient tolerated the procedure well.   Rec: Medical therapy with aggressive risk factor reduction.   Disposition: Will be discharged home today with outpatient follow up.

## 2012-02-07 NOTE — Progress Notes (Signed)
Pt with stable hematoma left arm just above antecubital crease.  Approximately 5 cm diameter, depth difficult to determine due to pts obesity.  No bleeding from stick site or hematoma at this area. Pt denies any weakness of arm or hand.  She does complain of pain at the stick site.     On exam no obvious median ulnar or axillary nerve deficit  Continue observation Evacuate hematoma if increasing size, pain, or changing neuro status Plan discussed with pt.  Fabienne Bruns, MD [facility] Office: [phone] Pager: [phone]

## 2012-02-07 NOTE — Interval H&P Note (Signed)
History and Physical Interval Note:  02/07/2012 11:08 AM  [name]  has presented today for surgery, with the diagnosis of Chest  pain  The various methods of treatment have been discussed with the patient and family. After consideration of risks, benefits and other options for treatment, the patient has consented to  Procedure(s) (LRB): LEFT AND RIGHT HEART CATHETERIZATION WITH CORONARY ANGIOGRAM (N/A) and possible angioplasty as a surgical intervention .  The patient's history has been reviewed, patient examined, no change in status, stable for surgery.  I have reviewed the patients' chart and labs.  Questions were answered to the patient's satisfaction.     Pamella Pert

## 2012-02-07 NOTE — Consult Note (Signed)
[facility]      Consult Note  Patient name: [name] MRN: [id number] DOB: 06-Oct-1970 Sex: female  Consulting Physician:  Dr. Jacinto Halim  Reason for Consult: No chief complaint on file.   HISTORY OF PRESENT ILLNESS: This is a 41 year old female who underwent angiography today via a right brachial approach it was found that her sheath had been withdrawn and route to PACU and she had developed swelling and pain in her right arm. Manual pressure was held. There appeared to be an expanding hematoma. Vascular surgery was asked to further evaluate. The patient denies having any numbness in her hand. She does complain of significant pain in the antecubital area. She has normal motor function of her hand.  Past Medical History  Diagnosis Date  . IDDM 08/09/2010  . Bell's palsy 08/09/2010  . SLE 08/09/2010  . SJOGREN'S SYNDROME 08/09/2010  . Hypertension     Past Surgical History  Procedure Date  . Hernia repair   . Ectopic pregnancy surgery     History   Social History  . Marital Status: Single    Spouse Name: N/A    Number of Children: N/A  . Years of Education: N/A   Occupational History  .      student   Social History Main Topics  . Smoking status: Former Smoker    Quit date: 08/01/1980  . Smokeless tobacco: Never Used  . Alcohol Use: No  . Drug Use: No  . Sexually Active: Not Currently   Other Topics Concern  . Not on file   Social History Narrative   Regular exercise-yes    Family History  Problem Relation Age of Onset  . Diabetes Mother   . Hypertension Other   . Stroke Other   . Arthritis Other     Allergies as of 01/19/2012 - Review Complete 12/21/2011  Allergen Reaction Noted  . Oxycodone-acetaminophen  08/09/2010    No current facility-administered medications on file prior to encounter.   Current Outpatient Prescriptions on File Prior to Encounter  Medication Sig Dispense Refill  . ALPRAZolam (XANAX) 1 MG tablet Take 1 mg  by mouth 2 (two) times daily.        Marland Kitchen atenolol (TENORMIN) 100 MG tablet Take 100 mg by mouth at bedtime.        Marland Kitchen azaTHIOprine (IMURAN) 50 MG tablet Take 50 mg by mouth 2 (two) times daily.       Marland Kitchen gabapentin (NEURONTIN) 300 MG capsule Take 300 mg by mouth 2 (two) times daily.       . hydroxychloroquine (PLAQUENIL) 200 MG tablet Take 200 mg by mouth 2 (two) times daily.       Marland Kitchen losartan (COZAAR) 100 MG tablet Take 100 mg by mouth daily.        . metolazone (ZAROXOLYN) 2.5 MG tablet Take 2.5 mg by mouth daily. Takes 30 minutes before morning furosemide      . potassium chloride SA (K-DUR,KLOR-CON) 20 MEQ tablet Take 20 mEq by mouth 2 (two) times daily.      . pravastatin (PRAVACHOL) 10 MG tablet Take 10 mg by mouth at bedtime.        . predniSONE (DELTASONE) 10 MG tablet Take 10 mg by mouth daily.      Marland Kitchen alendronate (FOSAMAX) 70 MG tablet Take 70 mg by mouth every 7 (seven) days. Saturdays. Take with a full glass of water on an empty stomach.  REVIEW OF SYSTEMS: Cardiovascular: No chest pain, chest pressure, Pulmonary: No productive cough, asthma or wheezing. Neurologic: No weakness, paresthesias, aphasia, or amaurosis. No dizziness. Hematologic: No bleeding problems or clotting disorders. Musculoskeletal: No joint pain or joint swelling. Gastrointestinal: No blood in stool or hematemesis Genitourinary: No dysuria or hematuria. Psychiatric:: No history of major depression. Integumentary: No rashes or ulcers. Constitutional: No fever or chills.  PHYSICAL EXAMINATION: General: The patient appears their stated age.  Vital signs are BP 130/78  Pulse 90  Temp 97.4 F (36.3 C) (Oral)  Resp 18  Ht 5\' 8"  (1.727 m)  Wt 385 lb (174.635 kg)  BMI 58.54 kg/m2  SpO2 100%  LMP 11/14/2011 Pulmonary: Respirations are non-labored HEENT:  No gross abnormalities Musculoskeletal: There are no major deformities.   Neurologic: No focal weakness or paresthesias are detected, Skin: There are  no ulcer or rashes noted. Psychiatric: The patient has normal affect. Cardiovascular: Palpable right radial pulse. There is a soft hematoma there does not appear to be expanding in the right antecubital crease extending up in to the upper arm. The patient has good grip strength and normal sensation in her right hand.   Assessment:  Right arm hematoma following angiography Plan: The patient will need to be admitted for overnight observation. We will continue with serial neurovascular checks throughout the night. At this time I would not recommend surgical intervention to decompress the hematoma as she did not have signs of arm ischemia or neurovascular compromise or compartment syndrome. I will order a duplex ultrasound in the morning to evaluate for possible pseudoaneurysm.     Jorge Ny, M.D. [facility] Office: [phone] Pager:  [phone]

## 2012-02-07 NOTE — H&P (Signed)
  Please see paper chart  

## 2012-02-08 DIAGNOSIS — S45909A Unspecified injury of unspecified blood vessel at shoulder and upper arm level, unspecified arm, initial encounter: Secondary | ICD-10-CM

## 2012-02-08 DIAGNOSIS — IMO0002 Reserved for concepts with insufficient information to code with codable children: Secondary | ICD-10-CM

## 2012-02-08 LAB — CBC
HCT: 32.3 % — ABNORMAL LOW (ref 36.0–46.0)
Hemoglobin: 10.7 g/dL — ABNORMAL LOW (ref 12.0–15.0)
MCH: 28.2 pg (ref 26.0–34.0)
MCHC: 33.1 g/dL (ref 30.0–36.0)
MCV: 85.2 fL (ref 78.0–100.0)

## 2012-02-08 MED ORDER — PROMETHAZINE HCL 25 MG/ML IJ SOLN
12.5000 mg | INTRAMUSCULAR | Status: DC | PRN
  Administered 2012-02-08: 12.5 mg via INTRAVENOUS
  Filled 2012-02-08: qty 1

## 2012-02-08 MED ORDER — TRAMADOL HCL 50 MG PO TABS: 50.0000 mg | ORAL_TABLET | Freq: Four times a day (QID) | ORAL | Status: AC | PRN

## 2012-02-08 MED ORDER — ACETAMINOPHEN 325 MG PO TABS: 650.0000 mg | ORAL_TABLET | ORAL | Status: DC | PRN

## 2012-02-08 NOTE — Plan of Care (Signed)
Problem: Phase II Progression Outcomes Goal: Ambulates up to 600 ft. in hall x 1 Outcome: Not Met (add Reason) Obese, not motivated.

## 2012-02-08 NOTE — Plan of Care (Signed)
Problem: Phase II Progression Outcomes Goal: Vascular site scale level 0 - I Vascular Site Scale Level 0: No bruising/bleeding/hematoma Level I (Mild): Bruising/Ecchymosis, minimal bleeding/ooozing, palpable hematoma < 3 cm Level II (Moderate): Bleeding not affecting hemodynamic parameters, pseudoaneurysm, palpable hematoma > 3 cm  Outcome: Adequate for Discharge Level 2 MD aware; stable site.

## 2012-02-08 NOTE — Progress Notes (Signed)
UR COMPLETED  

## 2012-02-08 NOTE — Progress Notes (Addendum)
Inpatient Diabetes Program Recommendations  AACE/ADA: New Consensus Statement on Inpatient Glycemic Control  Target Ranges:  Prepandial:   less than 140 mg/dL      Peak postprandial:   less than 180 mg/dL (1-2 hours)      Critically ill patients:  140 - 180 mg/dL  Pager:  [phone] Hours:  8 am-10pm   Reason for Visit: Elevated glucose: in 200s as well as elevated HgbA1C: 11.2%  Inpatient Diabetes Program Recommendations Insulin - Basal: Increase Lantus to 30 units qhs Insulin - Meal Coverage: Start with Novolog 10 units TID for meal coverage  Note: Ordered outpatient diabetes education as well due to elevated A1C.  Patient also set up with [facility].  May benefit from adding basal insulin at home or even U500 due to high amount of insulin coverage.  Patient has Medicaid which has a variety of preferred insulins to choose.  Alfredia Client PhD, RN Diabetes Coordinator  Office:  [phone] Team Pager:  [phone]

## 2012-02-08 NOTE — Progress Notes (Signed)
Subjective:   I appreciate vascular surgical colleagues consultation. I had extensive discussion with the patient and her family regarding brachial artery access and hematoma formation last night. This morning she continues to have pain and there is small hematoma noted in the right brachial site. She has no chest pain but has chronic dyspnea. This no fever, no tingling numbness in the hand or weakness in the hand.  Objective:  Vital Signs in the last 24 hours: Temp:  [97.4 F (36.3 C)-98.5 F (36.9 C)] 98.2 F (36.8 C) (07/10 0400) Pulse Rate:  [79-90] 82  (07/10 0600) Resp:  [18-22] 20  (07/10 0600) BP: (93-130)/(41-87) 103/50 mmHg (07/10 0600) SpO2:  [95 %-100 %] 95 % (07/10 0600) Weight:  [174.635 kg (385 lb)] 174.635 kg (385 lb) (07/09 0858)  Intake/Output from previous day: 07/09 0701 - 07/10 0700 In: 450 [I.V.:450] Out: -   Physical Exam:   General appearance: alert, appears stated age, no distress and morbidly obese Eyes: conjunctivae/corneas clear. PERRL, EOM's intact. Fundi benign. Neck: no adenopathy, no carotid bruit, no JVD, supple, symmetrical, trachea midline and thyroid not enlarged, symmetric, no tenderness/mass/nodules Neck: JVP - normal, carotids 2+= without bruits Resp: clear to auscultation bilaterally and Decreased breath sounds at bases Chest wall: no tenderness Cardio: regular rate and rhythm, S1, S2 normal, no murmur, click, rub or gallop GI: soft, non-tender; bowel sounds normal; no masses,  no organomegaly Extremities: extremities normal, atraumatic, no cyanosis or edema and Right antecubital site is normal without any bleeding. Just above the crease there is a small to moderate size hematoma. The skin is firm moderately. Again tenderness is evident. Good radial artery pulse is felt. There is no bruit.    Lab Results:  Basename 02/07/12 2036 02/07/12 1607  WBC -- --  HGB 11.2* 10.9*  PLT -- --   CBC    Component Value Date/Time   WBC 13.3*  11/11/2011 0421   RBC 4.36 11/11/2011 0421   HGB 11.2* 02/07/2012 2036   HCT 33.9* 02/07/2012 2036   PLT 331 11/11/2011 0421   MCV 85.6 11/11/2011 0421   MCH 27.1 11/11/2011 0421   MCHC 31.6 11/11/2011 0421   RDW 13.6 11/11/2011 0421   LYMPHSABS 1.4 07/27/2010 1826   MONOABS 0.4 07/27/2010 1826   EOSABS 0.0 07/27/2010 1826   BASOSABS 0.0 07/27/2010 1826     Cardiac Studies:  Assessment/Plan:   1. Right brachial artery hematoma without compartment syndrome, good radial artery pulse. No bruit. No clinical evidence of AV fistula. Patient is scheduled for ultrasound this morning. If the [facility] feels patient is stable I will discharge her this morning. Hemoglobin has remained stable. 2. Diabetes mellitus uncontrolled. 3. Morbid obesity 4. Shortness of breath and dyspnea on exertion due to the morbid obesity, diastolic dysfunction as evidenced by elevated left ventricle end-diastolic pressure.   Pamella Pert, M.D. 02/08/2012, 7:55 AM

## 2012-02-08 NOTE — Progress Notes (Signed)
Duplex negative for pseudoaneurysm Will need some pain control but ok for discharge from my standpoint Follow up PRN if worsening swelling or neuro symptoms  Fabienne Bruns, MD [facility] [place] Office: [phone] Pager: [phone]

## 2012-02-08 NOTE — Discharge Summary (Signed)
Physician Discharge Summary  Patient ID: [name]  MRN: [id number] DOB/AGE: January 10, 1971 41 y.o.  Admit date: 02/07/2012 Discharge date: 02/08/2012  Primary Discharge Diagnosis: 1. Right arm hematoma due to brachial artery access for left heart catheterization  Secondary Discharge Diagnosis 2. Shortness of breath and dyspnea on exertion due to diastolic dysfunction as evidenced by elevated left ventricle end-diastolic pressure during heart catheterization.  This is contributed by morbid obesity and diabetes 3.  Diabetes mellitus type 2 uncontrolled 4.  Morbid obesity 5.  Chronic anemia.  Significant Diagnostic Studies:Left heart catheterization performed on 02/07/2012 revealing normal coronary arteries, elevated left ventricle end-diastolic pressure, normal left systolic function. Arterial duplex of the arm performed on 02/08/2012 to evaluate hematoma at the brachial artery access site revealing large arm hematoma, no evidence of AV fistula or pseudoaneurysm.  Consults: Dr. Fabienne Bruns, vascular surgeon for evaluation of hematoma.  Conservative therapy recommended.  Appreciate his input.   Hospital Course: Patient was admitted to the hospital for elective cardiac catheterization.  Because of difficulty in obtaining venous access for right heart catheterization ultrasound-guided vascular access was obtained.  However instability is access arterial access was obtained at the brachial site.  However, this was acceptable for performing left heart catheterization which he underwent without any problems. In the recovery area, patient started developing right arm hematoma.  Due to morbid obesity after the sheath will it was very difficult to evaluate the hematoma.  Manual pressure was held for prolonged periods.  Eventually to be on the safe side I did obtain vascular consultation. The patient was kept overnight for observation.  She was evaluated by vascular surgeons the following day also and felt to  be stable.   Discharge Exam: Blood pressure 120/37, pulse 93, temperature 98 F (36.7 C), temperature source Oral, resp. rate 15, height 5\' 8"  (1.727 m), weight 174.635 kg (385 lb), last menstrual period 11/14/2011, SpO2 100.00%.   General appearance: alert, appears stated age, no distress and morbidly obese  Eyes: conjunctivae/corneas clear. PERRL, EOM's intact. Fundi benign.  Neck: no adenopathy, no carotid bruit, no JVD, supple, symmetrical, trachea midline and thyroid not enlarged, symmetric, no tenderness/mass/nodules  Neck: JVP - normal, carotids 2+= without bruits  Resp: clear to auscultation bilaterally and Decreased breath sounds at bases  Chest wall: no tenderness  Cardio: regular rate and rhythm, S1, S2 normal, no murmur, click, rub or gallop  GI: soft, non-tender; bowel sounds normal; no masses, no organomegaly  Extremities: extremities normal, atraumatic, no cyanosis or edema and Right antecubital site is normal without any bleeding. Just above the crease there is a small to moderate size hematoma. The skin is firm moderately. Again tenderness is evident. Good radial artery pulse is felt. There is no bruit.  Labs:   Lab Results  Component Value Date   WBC 11.8* 02/08/2012   HGB 10.7* 02/08/2012   HCT 32.3* 02/08/2012   MCV 85.2 02/08/2012   PLT 254 02/08/2012   No results found for this basename: NA,K,CL,CO2,BUN,CREATININE,CALCIUM,LABALBU,PROT,BILITOT,ALKPHOS,ALT,AST,GLUCOSE in the last 168 hours Lab Results  Component Value Date   CKTOTAL 206* 11/09/2011   CKMB 8.6* 11/09/2011   TROPONINI <0.30 11/09/2011     HbAic 11.2  Radiology: Dg Chest 2 View  01/20/2012  *RADIOLOGY REPORT*  Clinical Data: Increasing shortness of breath, history of congestive heart failure, cough  CHEST - 2 VIEW  Comparison: CT chest of 11/09/2011 and chest x-ray of 11/08/2011  Findings: Moderate cardiomegaly is stable.  There may be minimal pulmonary vascular congestion  present but there is no  evidence of congestive heart failure.  No pleural effusion is seen.  IMPRESSION: Stable cardiomegaly.  Question mild pulmonary vascular congestion.  Original Report Authenticated By: Juline Patch, M.D.     FOLLOW UP PLANS AND APPOINTMENTS Discharge Orders    Future Appointments: Provider: Department: Dept Phone: Center:   02/17/2012 9:30 AM Vevelyn Royals, RD [facility] [phone] NDM   02/22/2012 8:15 AM Romero Belling, MD [facility] [phone] [facility]     Future Orders Please Complete By Expires   Ambulatory referral to Nutrition and Diabetic Education      Comments:   Elevated HgbA1C of 11.2%   Hematocrit      Hemoglobin        Medication List  As of 02/08/2012  6:05 PM   TAKE these medications         acetaminophen 325 MG tablet   Commonly known as: TYLENOL   Take 2 tablets (650 mg total) by mouth every 4 (four) hours as needed.      alendronate 70 MG tablet   Commonly known as: FOSAMAX   Take 70 mg by mouth every 7 (seven) days. Saturdays. Take with a full glass of water on an empty stomach.      ALPRAZolam 1 MG tablet   Commonly known as: XANAX   Take 1 mg by mouth 2 (two) times daily.      aspirin EC 81 MG tablet   Take 81 mg by mouth daily.      atenolol 100 MG tablet   Commonly known as: TENORMIN   Take 100 mg by mouth at bedtime.      azaTHIOprine 50 MG tablet   Commonly known as: IMURAN   Take 50 mg by mouth 2 (two) times daily.      furosemide 80 MG tablet   Commonly known as: LASIX   Take 160 mg by mouth 2 (two) times daily.      gabapentin 300 MG capsule   Commonly known as: NEURONTIN   Take 300 mg by mouth 2 (two) times daily.      hydroxychloroquine 200 MG tablet   Commonly known as: PLAQUENIL   Take 200 mg by mouth 2 (two) times daily.      insulin aspart 100 UNIT/ML injection   Commonly known as: novoLOG   Inject into the skin 3 (three) times daily before meals. 120 units with breakfast, 130 units with lunch and 140 units with  dinner.      losartan 100 MG tablet   Commonly known as: COZAAR   Take 100 mg by mouth daily.      metolazone 2.5 MG tablet   Commonly known as: ZAROXOLYN   Take 2.5 mg by mouth daily. Takes 30 minutes before morning furosemide      potassium chloride SA 20 MEQ tablet   Commonly known as: K-DUR,KLOR-CON   Take 20 mEq by mouth 2 (two) times daily.      pravastatin 10 MG tablet   Commonly known as: PRAVACHOL   Take 10 mg by mouth at bedtime.      predniSONE 10 MG tablet   Commonly known as: DELTASONE   Take 10 mg by mouth daily.      traMADol 50 MG tablet   Commonly known as: ULTRAM   Take 1 tablet (50 mg total) by mouth every 6 (six) hours as needed for pain.  Follow-up Information    Call Pamella Pert, MD. (To be seen for wound check on Friday 02/10/12)    Contact information:   [address]  [place] [address] [phone]           Pamella Pert, MD 02/08/2012, 6:05 PM

## 2012-02-08 NOTE — Progress Notes (Addendum)
[facility]  HPI  - This is a 41 year old female who underwent angiography today via a right brachial approach it was found that her sheath had been withdrawn and route to PACU and she had developed swelling and pain in her right arm. Manual pressure was held. There appeared to be an expanding hematoma. Vascular surgery was asked to further evaluate. The patient denies having any numbness in her hand. She does complain of significant pain in the antecubital area. She has normal motor function of her hand.   No new complaints.  Some numbness to fingers on/off. Patient states she has not had active bleeding since yesterday after procedure.  Objective 103/50 82 98.2 F (36.8 C) (Oral) 20 95%  Intake/Output Summary (Last 24 hours) at 02/08/12 0756 Last data filed at 02/07/12 1900  Gross per 24 hour  Intake    450 ml  Output      0 ml  Net    450 ml    AROM intact right hand and fingers. Tenderness to palpation around distal upper arm, skin soft without ecchymosis or erythema.  Assessment/Planning: Continue observation it appears that the site is stable. Evacuate hematoma if increasing size, pain, or changing neuro status  Plan discussed with pt.    Clinton Gallant [facility] 02/08/2012 7:56 AM --  Laboratory Lab Results:  Alvira Philips 02/07/12 2036 02/07/12 1607  WBC -- --  HGB 11.2* 10.9*  HCT 33.9* 33.3*  PLT -- --   BMET No results found for this basename: NA:2,K:2,CL:2,CO2:2,GLUCOSE:2,BUN:2,CREATININE:2,CALCIUM:2 in the last 72 hours  COAG Lab Results  Component Value Date   INR 0.99 07/27/2010   INR 1.0 05/25/2007   No results found for this basename: PTT    Antibiotics Anti-infectives    None         Overall arm is softer today.  Pt still has some pain over area of hematoma, medial arm above antecubital area.   Duplex today.  If no pseudoaneurysm can probably be managed conservatively with rest/elevation  Fabienne Bruns,  MD [facility] Office: [phone] Pager: [phone]

## 2012-02-08 NOTE — Progress Notes (Signed)
VASCULAR LAB PRELIMINARY  PRELIMINARY  PRELIMINARY  PRELIMINARY  Right upper extremity arterial study for brachial pseudoaneurysm completed. completed.    Preliminary report:  No evidence of pseudoaneurysm noted throughout the brachial or radial arteries. Trip[hasic Doppler waveforms throughout.  Sevag Shearn, RVS 02/08/2012, 12:05 PM

## 2012-02-08 NOTE — Progress Notes (Signed)
Right arm remains stable. At discharge circumference 20.5 inches proximal, 19.75 inches mid, and 17 inches distal rua.  Pain persits; MD aware; Dc'd home.

## 2012-02-10 ENCOUNTER — Telehealth: Payer: Self-pay

## 2012-02-10 NOTE — Telephone Encounter (Signed)
HHRN called to inform MD that pt's blood sugars continue to be elevated, running 300+.

## 2012-02-10 NOTE — Telephone Encounter (Signed)
Increase novolog to 3x a day (just before each meal) 140-150-160 units.

## 2012-02-10 NOTE — Telephone Encounter (Signed)
HHRN with [facility] informed of MD's advisement, she states that she will let pt know of increased insulin adjustment.

## 2012-02-15 ENCOUNTER — Telehealth: Payer: Self-pay

## 2012-02-15 NOTE — Telephone Encounter (Signed)
RN called to report that despite increase in her insulin 07/12 - 140 units in the am, 150 at lunch and 160 in the evening her BS are still 300+. Please advise in Dr George Hugh absence (pt unable to contact PCP?), thank you!

## 2012-02-15 NOTE — Telephone Encounter (Signed)
Increase each dose by another 10 units: 150-160-170

## 2012-02-16 NOTE — Telephone Encounter (Signed)
HHRN informed of increase and will inform pt. Pt is also not happy with having to injection insulin several times per day (3 each time). HHRN advised that pt has follow up appt with [facility] 07/27 and should discuss at that time.

## 2012-02-17 ENCOUNTER — Ambulatory Visit: Payer: Medicaid Other | Admitting: *Deleted

## 2012-02-21 ENCOUNTER — Encounter: Payer: Self-pay | Admitting: *Deleted

## 2012-02-21 ENCOUNTER — Encounter: Payer: Medicaid Other | Attending: Internal Medicine | Admitting: *Deleted

## 2012-02-21 VITALS — Ht 68.0 in | Wt 392.7 lb

## 2012-02-21 DIAGNOSIS — E669 Obesity, unspecified: Secondary | ICD-10-CM | POA: Insufficient documentation

## 2012-02-21 DIAGNOSIS — E119 Type 2 diabetes mellitus without complications: Secondary | ICD-10-CM

## 2012-02-21 DIAGNOSIS — Z713 Dietary counseling and surveillance: Secondary | ICD-10-CM | POA: Insufficient documentation

## 2012-02-21 NOTE — Progress Notes (Signed)
  Medical Nutrition Therapy: Appt start time: 0800 end time: 0830.   Assessment: Primary concerns today: patient here for follow up visit for obesity. She was hospitalized 2 weeks ago for cardiac cath procedure and states there were complications which extended her hospital stay. On her way home from hospital her car broke down and she had to have it towed. So she has no personal transportation and has stayed home the last 2 weeks. She states her curtains are drawn and with her feelings of being overwhelmed, she has not participated in any activities at home or outside. She is very frustrated with high BGs and states it takes several injections with the Novolog Pen to give the current doses of over 150 units per meal. She understands that the action time of Novolog is only 4-5 hours. She continues to limit her sodium intake but with all of her concerns, she is eating more than usual   MEDICATIONS: Current Novolog doses:150 units am, 160 units lunch, 170 units, supper  DIETARY INTAKE:  Usual eating pattern includes 3 meals and 0-1 snacks per day.  24-hr recall:  B (8 AM): oatmeal (1 pack), 2 boiled egg OR [employer] Protein Shake  Snk ( AM): N/A  L (12-1 PM): Green salad with grilled chicken (light dressing) OR 6 inch tuna subway sub  Snk ( PM): N/A  D (6-8PM): Chicken breast (grilled) w/ 1 cup of green vegetable OR Salmon patty, green beans, and 1/2 cup low-carb pasta Snk (9-11 PM): sugar-free fruit cup  Beverages: Diet green tea, water, protein shake,   Estimated energy needs:  1600 calories  180 g carbohydrates  120 g protein  45-50 g fat   Progress Towards Goal(s): In progress.   Nutritional Diagnosis:  [place]-3.3 Overweight/obesity As related to limited physical activity and frequent meal skipping. As evidenced by pt with BMI 57.   Intervention: Reviewed the circumstances that she is struggling with and helped her identify what she has control over so she can make improvements, ie aiming  for 3 Carb choices per meal, increasing activity level with Arm Chair exercises for a few minutes several times a day.and continuing to record BG for MD review.   Plan: Continue to cook chicken in water ahead of time and freeze chicken separately so ready to eat quickly AND freeze broth in ice cube trays so can use to  season veggies, etc without adding any sodium Continue to choose fresh over canned or processed to lower sodium Read Food Labels for Total Carb (15 grams = 1 Carb choice) and Sodium (goal is 1500 mg/day or around 500 mg/meal) Continue with aiming for 3 Carb choices per meal to assist with BG and weight management  Continue to check BG before each meal as directed by MD  Continue to take Novolog insulin as directed by MD Consider asking MD about adding a dose of Novolog at MN to provide coverage overnight Consider opening the curtains and doing some Arm Chair exercises for 5-10 minutes after each meal Consider some activities to get you out of the house and participating in group activities    Monitoring/Evaluation: Dietary intake, exercise, glucose control, and body weight in 4 weeks

## 2012-02-21 NOTE — Patient Instructions (Signed)
Plan: Continue to cook chicken in water ahead of time and freeze chicken separately so ready to eat quickly AND freeze broth in ice cube trays so can use to  season veggies, etc without adding any sodium Continue to choose fresh over canned or processed to lower sodium Read Food Labels for Total Carb (15 grams = 1 Carb choice) and Sodium (goal is 1500 mg/day or around 500 mg/meal) Continue with aiming for 3 Carb choices per meal to assist with BG and weight management  Continue to check BG before each meal as directed by MD  Continue to take Novolog insulin as directed by MD Consider asking MD about adding a dose of Novolog at MN to provide coverage overnight Consider opening the curtains and doing some Arm Chair exercises for 5-10 minutes after each meal Consider some activities to get you out of the house and participating in group activities

## 2012-02-22 ENCOUNTER — Ambulatory Visit (INDEPENDENT_AMBULATORY_CARE_PROVIDER_SITE_OTHER): Payer: Medicaid Other | Admitting: Endocrinology

## 2012-02-22 ENCOUNTER — Encounter: Payer: Self-pay | Admitting: Endocrinology

## 2012-02-22 VITALS — BP 128/86 | HR 95 | Temp 98.4°F | Ht 68.0 in | Wt 395.0 lb

## 2012-02-22 DIAGNOSIS — E119 Type 2 diabetes mellitus without complications: Secondary | ICD-10-CM

## 2012-02-22 MED ORDER — INSULIN REGULAR HUMAN (CONC) 500 UNIT/ML ~~LOC~~ SOLN: SUBCUTANEOUS | Status: DC

## 2012-02-22 MED ORDER — "INSULIN SYRINGE 29G X 1/2"" 1 ML MISC": Status: DC

## 2012-02-22 NOTE — Patient Instructions (Addendum)
please change novolog to "U-500" three times a day (just before each meal) 150-150-200 units (because it is 5x concentrated, draw it up as though it is 30-30-40 units).   check your blood sugar 2 times a day.  vary the time of day when you check, between before the 3 meals, and at bedtime.  also check if you have symptoms of your blood sugar being too high or too low.  please keep a record of the readings and bring it to your next appointment here.  please call us sooner if you are having low blood sugar episodes, or if it stays over 200.   Please come back for a follow-up appointment in 2 weeks.

## 2012-02-22 NOTE — Progress Notes (Signed)
Subjective:    Patient ID: [name], female    DOB: [birth year], 41 y.o.   MRN: [id number]  HPI Pt returns for f/u of insulin-requiring DM (dx'ed 2011; no known complications).  no cbg record, but states cbg's vary from the 200-300's.  It is in general higher as the day goes on. She has fatigue.  She is still on prednisone.   Past Medical History  Diagnosis Date  . IDDM 08/09/2010  . Bell's palsy 08/09/2010  . SLE 08/09/2010  . SJOGREN'S SYNDROME 08/09/2010  . Hypertension   . High cholesterol   . CHF (congestive heart failure)   . Anginal pain   . Shortness of breath     "all the time"  . Obstructive sleep apnea on CPAP 2011  . Migraines   . Stroke 2010    "left side is still weak from it; never fully regained full strength"  . Anxiety     Past Surgical History  Procedure Date  . Ectopic pregnancy surgery 1999  . Cardiac catheterization 02/07/12  . Hernia repair 1980's    umbilical    History   Social History  . Marital Status: Single    Spouse Name: N/A    Number of Children: N/A  . Years of Education: N/A   Occupational History  .      student   Social History Main Topics  . Smoking status: Former Smoker -- 3 years    Types: Cigarettes    Quit date: 06/01/1990  . Smokeless tobacco: Never Used  . Alcohol Use: No  . Drug Use: No  . Sexually Active: No   Other Topics Concern  . Not on file   Social History Narrative   Regular exercise-yes    Current Outpatient Prescriptions on File Prior to Visit  Medication Sig Dispense Refill  . acetaminophen (TYLENOL) 325 MG tablet Take 2 tablets (650 mg total) by mouth every 4 (four) hours as needed.      Marland Kitchen alendronate (FOSAMAX) 70 MG tablet Take 70 mg by mouth every 7 (seven) days. Saturdays. Take with a full glass of water on an empty stomach.      . ALPRAZolam (XANAX) 1 MG tablet Take 1 mg by mouth 2 (two) times daily.        Marland Kitchen aspirin EC 81 MG tablet Take 81 mg by mouth daily.      Marland Kitchen atenolol (TENORMIN) 100 MG tablet  Take 100 mg by mouth at bedtime.        Marland Kitchen azaTHIOprine (IMURAN) 50 MG tablet Take 50 mg by mouth 2 (two) times daily.       . furosemide (LASIX) 80 MG tablet Take 160 mg by mouth 2 (two) times daily.      Marland Kitchen gabapentin (NEURONTIN) 300 MG capsule Take 300 mg by mouth 2 (two) times daily.       . hydroxychloroquine (PLAQUENIL) 200 MG tablet Take 200 mg by mouth 2 (two) times daily.       Marland Kitchen losartan (COZAAR) 100 MG tablet Take 100 mg by mouth daily.        . metolazone (ZAROXOLYN) 2.5 MG tablet Take 2.5 mg by mouth daily. Takes 30 minutes before morning furosemide      . potassium chloride SA (K-DUR,KLOR-CON) 20 MEQ tablet Take 20 mEq by mouth 2 (two) times daily.      . pravastatin (PRAVACHOL) 10 MG tablet Take 10 mg by mouth at bedtime.        Marland Kitchen  predniSONE (DELTASONE) 10 MG tablet Take 10 mg by mouth daily.      . insulin regular human CONCENTRATED (HUMULIN R) 500 UNIT/ML SOLN injection 3x a day (just before each meal) 150-150-200 units, and 1 cc syringes 3/day  4 vial  11    Allergies  Allergen Reactions  . Oxycodone-Acetaminophen Hives    Family History  Problem Relation Age of Onset  . Diabetes Mother   . Hypertension Other   . Stroke Other   . Arthritis Other     BP 128/86  Pulse 95  Temp 98.4 F (36.9 C) (Oral)  Ht 5\' 8"  (1.727 m)  Wt 395 lb (179.171 kg)  BMI 60.06 kg/m2  SpO2 97%  LMP 11/14/2011    Review of Systems denies hypoglycemia    Objective:   Physical Exam VITAL SIGNS:  See vs page GENERAL: no distress PSYCH: Alert and oriented x 3.  Does not appear anxious nor depressed.   Lab Results  Component Value Date   HGBA1C 11.2* 02/07/2012      Assessment & Plan:  DM.  needs increased rx

## 2012-03-09 ENCOUNTER — Encounter: Payer: Self-pay | Admitting: Endocrinology

## 2012-03-09 ENCOUNTER — Ambulatory Visit (INDEPENDENT_AMBULATORY_CARE_PROVIDER_SITE_OTHER): Payer: Medicaid Other | Admitting: Endocrinology

## 2012-03-09 VITALS — BP 122/80 | HR 90 | Temp 97.5°F | Ht 68.0 in | Wt 378.0 lb

## 2012-03-09 DIAGNOSIS — E109 Type 1 diabetes mellitus without complications: Secondary | ICD-10-CM

## 2012-03-09 NOTE — Patient Instructions (Addendum)
please reduce  "U-500" to three times a day (just before each meal) 150-150-175 units (because it is 5x concentrated, draw it up as though it is 30-30-35 units).   check your blood sugar 2 times a day.  vary the time of day when you check, between before the 3 meals, and at bedtime.  also check if you have symptoms of your blood sugar being too high or too low.  please keep a record of the readings and bring it to your next appointment here.  please call us sooner if you are having low blood sugar episodes, or if it stays over 200.   Please come back for a follow-up appointment in 2 months.

## 2012-03-09 NOTE — Progress Notes (Signed)
Subjective:    Patient ID: [name], female    DOB: 19-Jan-1971, 41 y.o.   MRN: [id number]  HPI Pt returns for f/u of insulin-requiring DM (dx'ed 2011; no known complications).  no cbg record, but states cbg's vary from 52 up to the high-100's.  It is in general highest in the afternoon.  She has fatigue.  She is still on prednisone.  Past Medical History  Diagnosis Date  . IDDM 08/09/2010  . Bell's palsy 08/09/2010  . SLE 08/09/2010  . SJOGREN'S SYNDROME 08/09/2010  . Hypertension   . High cholesterol   . CHF (congestive heart failure)   . Anginal pain   . Shortness of breath     "all the time"  . Obstructive sleep apnea on CPAP 2011  . Migraines   . Stroke 2010    "left side is still weak from it; never fully regained full strength"  . Anxiety     Past Surgical History  Procedure Date  . Ectopic pregnancy surgery 1999  . Cardiac catheterization 02/07/12  . Hernia repair 1980's    umbilical    History   Social History  . Marital Status: Single    Spouse Name: N/A    Number of Children: N/A  . Years of Education: N/A   Occupational History  .      student   Social History Main Topics  . Smoking status: Former Smoker -- 3 years    Types: Cigarettes    Quit date: 06/01/1990  . Smokeless tobacco: Never Used  . Alcohol Use: No  . Drug Use: No  . Sexually Active: No   Other Topics Concern  . Not on file   Social History Narrative   Regular exercise-yes    Current Outpatient Prescriptions on File Prior to Visit  Medication Sig Dispense Refill  . acetaminophen (TYLENOL) 325 MG tablet Take 2 tablets (650 mg total) by mouth every 4 (four) hours as needed.      Marland Kitchen alendronate (FOSAMAX) 70 MG tablet Take 70 mg by mouth every 7 (seven) days. Saturdays. Take with a full glass of water on an empty stomach.      . ALPRAZolam (XANAX) 1 MG tablet Take 1 mg by mouth 2 (two) times daily.        Marland Kitchen aspirin EC 81 MG tablet Take 81 mg by mouth daily.      Marland Kitchen atenolol (TENORMIN) 100  MG tablet Take 100 mg by mouth at bedtime.        Marland Kitchen azaTHIOprine (IMURAN) 50 MG tablet Take 50 mg by mouth 2 (two) times daily.       . furosemide (LASIX) 80 MG tablet Take 160 mg by mouth 2 (two) times daily.      Marland Kitchen gabapentin (NEURONTIN) 300 MG capsule Take 300 mg by mouth 2 (two) times daily.       . hydroxychloroquine (PLAQUENIL) 200 MG tablet Take 200 mg by mouth 2 (two) times daily.       . insulin regular human CONCENTRATED (HUMULIN R) 500 UNIT/ML SOLN injection 3x a day (just before each meal) 150-150-175 units, and 1 cc syringes 3/day      . INSULIN SYRINGE 1CC/29G (B-D INSULIN SYRINGE) 29G X 1/2" 1 ML MISC Use as directed three times daily  100 each  5  . losartan (COZAAR) 100 MG tablet Take 100 mg by mouth daily.        . metolazone (ZAROXOLYN) 2.5 MG tablet Take 2.5 mg by  mouth daily. Takes 30 minutes before morning furosemide      . potassium chloride SA (K-DUR,KLOR-CON) 20 MEQ tablet Take 20 mEq by mouth 2 (two) times daily.      . pravastatin (PRAVACHOL) 10 MG tablet Take 10 mg by mouth at bedtime.        . predniSONE (DELTASONE) 10 MG tablet Take 10 mg by mouth daily.      . traZODone (DESYREL) 100 MG tablet Take 100 mg by mouth at bedtime.        Allergies  Allergen Reactions  . Oxycodone-Acetaminophen Hives    Family History  Problem Relation Age of Onset  . Diabetes Mother   . Hypertension Other   . Stroke Other   . Arthritis Other     BP 122/80  Pulse 90  Temp 97.5 F (36.4 C) (Oral)  Ht 5\' 8"  (1.727 m)  Wt 378 lb (171.46 kg)  BMI 57.47 kg/m2  SpO2 99%  LMP 11/14/2011  Review of Systems Denies loc    Objective:   Physical Exam VITAL SIGNS:  See vs page GENERAL: no distress PSYCH: Alert and oriented x 3.  Does not appear anxious nor depressed.     Assessment & Plan:  DM, slightly overcontrolled

## 2012-03-19 ENCOUNTER — Ambulatory Visit: Payer: Medicaid Other | Admitting: Physical Therapy

## 2012-03-20 ENCOUNTER — Ambulatory Visit: Payer: Medicaid Other | Admitting: *Deleted

## 2012-03-20 ENCOUNTER — Ambulatory Visit: Payer: Medicaid Other | Attending: Internal Medicine | Admitting: *Deleted

## 2012-03-20 DIAGNOSIS — R269 Unspecified abnormalities of gait and mobility: Secondary | ICD-10-CM | POA: Insufficient documentation

## 2012-03-20 DIAGNOSIS — IMO0001 Reserved for inherently not codable concepts without codable children: Secondary | ICD-10-CM | POA: Insufficient documentation

## 2012-03-20 DIAGNOSIS — M6281 Muscle weakness (generalized): Secondary | ICD-10-CM | POA: Insufficient documentation

## 2012-03-27 ENCOUNTER — Ambulatory Visit: Payer: Medicaid Other | Admitting: *Deleted

## 2012-04-03 ENCOUNTER — Ambulatory Visit: Payer: Medicaid Other | Admitting: *Deleted

## 2012-04-04 ENCOUNTER — Ambulatory Visit: Payer: Medicaid Other | Attending: Internal Medicine | Admitting: *Deleted

## 2012-04-04 DIAGNOSIS — M6281 Muscle weakness (generalized): Secondary | ICD-10-CM | POA: Insufficient documentation

## 2012-04-04 DIAGNOSIS — IMO0001 Reserved for inherently not codable concepts without codable children: Secondary | ICD-10-CM | POA: Insufficient documentation

## 2012-04-04 DIAGNOSIS — R269 Unspecified abnormalities of gait and mobility: Secondary | ICD-10-CM | POA: Insufficient documentation

## 2012-04-11 ENCOUNTER — Ambulatory Visit: Payer: Medicaid Other | Admitting: *Deleted

## 2012-05-08 ENCOUNTER — Encounter: Payer: Self-pay | Admitting: Endocrinology

## 2012-05-08 ENCOUNTER — Ambulatory Visit: Payer: Medicaid Other | Admitting: Endocrinology

## 2012-05-08 ENCOUNTER — Ambulatory Visit (INDEPENDENT_AMBULATORY_CARE_PROVIDER_SITE_OTHER): Payer: Medicaid Other | Admitting: Endocrinology

## 2012-05-08 VITALS — BP 126/80 | HR 104 | Temp 98.0°F | Wt >= 6400 oz

## 2012-05-08 DIAGNOSIS — E109 Type 1 diabetes mellitus without complications: Secondary | ICD-10-CM

## 2012-05-08 LAB — HEMOGLOBIN A1C: Mean Plasma Glucose: 183 mg/dL — ABNORMAL HIGH (ref <117)

## 2012-05-08 NOTE — Progress Notes (Signed)
Subjective:    Patient ID: [name], female    DOB: 11/16/1970, 41 y.o.   MRN: [id number]  HPI Pt returns for f/u of insulin-requiring DM (dx'ed 2011; no known complications).  no cbg record, but states cbg's vary from 87 up to 200.  It is in general highest in the afternoon.  She has fatigue.  She is still on prednisone, 9 mg QD. Past Medical History  Diagnosis Date  . IDDM 08/09/2010  . Bell's palsy 08/09/2010  . SLE 08/09/2010  . SJOGREN'S SYNDROME 08/09/2010  . Hypertension   . High cholesterol   . CHF (congestive heart failure)   . Anginal pain   . Shortness of breath     "all the time"  . Obstructive sleep apnea on CPAP 2011  . Migraines   . Stroke 2010    "left side is still weak from it; never fully regained full strength"  . Anxiety     Past Surgical History  Procedure Date  . Ectopic pregnancy surgery 1999  . Cardiac catheterization 02/07/12  . Hernia repair 1980's    umbilical    History   Social History  . Marital Status: Single    Spouse Name: N/A    Number of Children: N/A  . Years of Education: N/A   Occupational History  .      student   Social History Main Topics  . Smoking status: Former Smoker -- 3 years    Types: Cigarettes    Quit date: 06/01/1990  . Smokeless tobacco: Never Used  . Alcohol Use: No  . Drug Use: No  . Sexually Active: No   Other Topics Concern  . Not on file   Social History Narrative   Regular exercise-yes    Current Outpatient Prescriptions on File Prior to Visit  Medication Sig Dispense Refill  . acetaminophen (TYLENOL) 325 MG tablet Take 2 tablets (650 mg total) by mouth every 4 (four) hours as needed.      Marland Kitchen alendronate (FOSAMAX) 70 MG tablet Take 70 mg by mouth every 7 (seven) days. Saturdays. Take with a full glass of water on an empty stomach.      . ALPRAZolam (XANAX) 1 MG tablet Take 1 mg by mouth 2 (two) times daily.        Marland Kitchen aspirin EC 81 MG tablet Take 81 mg by mouth daily.      Marland Kitchen atenolol (TENORMIN) 100 MG  tablet Take 100 mg by mouth at bedtime.        Marland Kitchen azaTHIOprine (IMURAN) 50 MG tablet Take 50 mg by mouth 2 (two) times daily.       Marland Kitchen gabapentin (NEURONTIN) 300 MG capsule Take 300 mg by mouth 2 (two) times daily.       . hydroxychloroquine (PLAQUENIL) 200 MG tablet Take 200 mg by mouth 2 (two) times daily.       . insulin regular human CONCENTRATED (HUMULIN R) 500 UNIT/ML SOLN injection 3x a day (just before each meal) 150-150-175 units, and 1 cc syringes 3/day      . INSULIN SYRINGE 1CC/29G (B-D INSULIN SYRINGE) 29G X 1/2" 1 ML MISC Use as directed three times daily  100 each  5  . losartan (COZAAR) 100 MG tablet Take 100 mg by mouth daily.        . metolazone (ZAROXOLYN) 2.5 MG tablet Take 2.5 mg by mouth daily. Takes 30 minutes before morning furosemide      . potassium chloride SA (K-DUR,KLOR-CON) 20  MEQ tablet Take 20 mEq by mouth 2 (two) times daily.      . pravastatin (PRAVACHOL) 10 MG tablet Take 10 mg by mouth at bedtime.        . predniSONE (DELTASONE) 10 MG tablet Take 10 mg by mouth daily.      Marland Kitchen torsemide (DEMADEX) 10 MG tablet Take 10 mg by mouth daily.      . traZODone (DESYREL) 100 MG tablet Take 100 mg by mouth at bedtime.      . furosemide (LASIX) 80 MG tablet Take 160 mg by mouth 2 (two) times daily.        Allergies  Allergen Reactions  . Oxycodone-Acetaminophen Hives    Family History  Problem Relation Age of Onset  . Diabetes Mother   . Hypertension Other   . Stroke Other   . Arthritis Other     BP 126/80  Pulse 104  Temp 98 F (36.7 C) (Oral)  Wt 409 lb (185.521 kg)  SpO2 97%  LMP 11/14/2011  Review of Systems denies hypoglycemia    Objective:   Physical Exam Pulses: dorsalis pedis intact bilat, but decreased (prob due to edema) Feet: no deformity. no ulcer on the feet. feet are of normal color and temp. 1+ bilat leg edema. There is bilateral onychomycosis Neuro: sensation is intact to touch on the feet.     Assessment & Plan:  DM, with  apparently improved control

## 2012-05-08 NOTE — Patient Instructions (Addendum)
please continue  "U-500"  three times a day (just before each meal) 150-150-175 units (because it is 5x concentrated, draw it up as though it is 30-30-35 units).   check your blood sugar 2 times a day.  vary the time of day when you check, between before the 3 meals, and at bedtime.  also check if you have symptoms of your blood sugar being too high or too low.  please keep a record of the readings and bring it to your next appointment here.  please call us sooner if you are having low blood sugar episodes, or if it stays over 200.   Please come back for a follow-up appointment in 3 months.   

## 2012-07-10 ENCOUNTER — Other Ambulatory Visit: Payer: Self-pay | Admitting: *Deleted

## 2012-07-10 MED ORDER — "INSULIN SYRINGE 29G X 1/2"" 1 ML MISC": Status: DC

## 2012-07-30 ENCOUNTER — Emergency Department (HOSPITAL_COMMUNITY): Payer: Medicaid Other

## 2012-07-30 ENCOUNTER — Encounter (HOSPITAL_COMMUNITY): Payer: Self-pay | Admitting: *Deleted

## 2012-07-30 ENCOUNTER — Emergency Department (HOSPITAL_COMMUNITY)
Admission: EM | Admit: 2012-07-30 | Discharge: 2012-07-30 | Disposition: A | Discharge status: Home / Self Care | Payer: Medicaid Other | Attending: Emergency Medicine | Admitting: Emergency Medicine

## 2012-07-30 DIAGNOSIS — Z79899 Other long term (current) drug therapy: Secondary | ICD-10-CM | POA: Insufficient documentation

## 2012-07-30 DIAGNOSIS — I10 Essential (primary) hypertension: Secondary | ICD-10-CM | POA: Insufficient documentation

## 2012-07-30 DIAGNOSIS — R079 Chest pain, unspecified: Secondary | ICD-10-CM | POA: Insufficient documentation

## 2012-07-30 DIAGNOSIS — Z8673 Personal history of transient ischemic attack (TIA), and cerebral infarction without residual deficits: Secondary | ICD-10-CM | POA: Insufficient documentation

## 2012-07-30 DIAGNOSIS — E7800 Pure hypercholesterolemia, unspecified: Secondary | ICD-10-CM | POA: Insufficient documentation

## 2012-07-30 DIAGNOSIS — M545 Low back pain, unspecified: Secondary | ICD-10-CM | POA: Insufficient documentation

## 2012-07-30 DIAGNOSIS — I509 Heart failure, unspecified: Secondary | ICD-10-CM | POA: Insufficient documentation

## 2012-07-30 DIAGNOSIS — F411 Generalized anxiety disorder: Secondary | ICD-10-CM | POA: Insufficient documentation

## 2012-07-30 DIAGNOSIS — Z794 Long term (current) use of insulin: Secondary | ICD-10-CM | POA: Insufficient documentation

## 2012-07-30 DIAGNOSIS — G510 Bell's palsy: Secondary | ICD-10-CM | POA: Insufficient documentation

## 2012-07-30 DIAGNOSIS — Z87891 Personal history of nicotine dependence: Secondary | ICD-10-CM | POA: Insufficient documentation

## 2012-07-30 DIAGNOSIS — M329 Systemic lupus erythematosus, unspecified: Secondary | ICD-10-CM | POA: Insufficient documentation

## 2012-07-30 DIAGNOSIS — G8929 Other chronic pain: Secondary | ICD-10-CM | POA: Insufficient documentation

## 2012-07-30 DIAGNOSIS — R609 Edema, unspecified: Secondary | ICD-10-CM | POA: Insufficient documentation

## 2012-07-30 DIAGNOSIS — Z7982 Long term (current) use of aspirin: Secondary | ICD-10-CM | POA: Insufficient documentation

## 2012-07-30 DIAGNOSIS — R0602 Shortness of breath: Secondary | ICD-10-CM

## 2012-07-30 DIAGNOSIS — E119 Type 2 diabetes mellitus without complications: Secondary | ICD-10-CM | POA: Insufficient documentation

## 2012-07-30 DIAGNOSIS — Z8679 Personal history of other diseases of the circulatory system: Secondary | ICD-10-CM | POA: Insufficient documentation

## 2012-07-30 LAB — PRO B NATRIURETIC PEPTIDE: Pro B Natriuretic peptide (BNP): 69.2 pg/mL (ref 0–125)

## 2012-07-30 LAB — BASIC METABOLIC PANEL
Calcium: 9 mg/dL (ref 8.4–10.5)
Creatinine, Ser: 0.95 mg/dL (ref 0.50–1.10)
GFR calc non Af Amer: 73 mL/min — ABNORMAL LOW (ref >90)
Sodium: 139 mEq/L (ref 135–145)

## 2012-07-30 LAB — CBC
MCH: 27.8 pg (ref 26.0–34.0)
MCHC: 32.5 g/dL (ref 30.0–36.0)
MCV: 85.4 fL (ref 78.0–100.0)
Platelets: 357 10*3/uL (ref 150–400)
RDW: 13.6 % (ref 11.5–15.5)

## 2012-07-30 LAB — TROPONIN I: Troponin I: <0.3 ng/mL (ref <0.30)

## 2012-07-30 MED ORDER — FUROSEMIDE 10 MG/ML IJ SOLN
80.0000 mg | Freq: Once | INTRAMUSCULAR | Status: AC
  Administered 2012-07-30: 80 mg via INTRAVENOUS
  Filled 2012-07-30: qty 8

## 2012-07-30 MED ORDER — SODIUM CHLORIDE 0.9 % IV SOLN
Freq: Once | INTRAVENOUS | Status: AC
  Administered 2012-07-30: 18:00:00 via INTRAVENOUS

## 2012-07-30 NOTE — ED Provider Notes (Signed)
History     CSN: [id number]  Arrival date & time 07/30/12  1603   First MD Initiated Contact with Patient 07/30/12 1717      Chief Complaint  Patient presents with  . Chest Pain  . Shortness of Breath    (Consider location/radiation/quality/duration/timing/severity/associated sxs/prior treatment) HPI This 41 year old morbidly obese diabetic with history of heart failure sleep apnea and chronic shortness of breath lives at home alone and states home health nurse was unable to visit her any more and the patient's weight scale, pulse oximeter, and blood pressure cuff were removed from her home because she does not qualify for home health equipment anymore, the patient does not weigh herself in a couple weeks but no she has been gaining weight because she is worsening edema and worsening orthopnea with worsening shortness of breath and worsening chest tightness present 24 hours a day for the last couple weeks since she was last seen by home health nurse. Patient has no fever no confusion no cough no exertional chest pain no vomiting or other concerns. She at baseline short of breath walking about 20 feet but now is not able to speak full sentences her walk 3 feet without feeling is moderately short of breath. She cannot walk 10 feet to the bathroom without feeling moderately is not severely short of breath and generally weak. She has chronic low back pain that radiates to her right hip. Her right lateral hip is tender for weeks worse when she walks there is no fall no trauma. She is no pain radiating down below her right knee with no weakness or numbness that is new in all to the right leg. Of legs chronically feel weak and she is generally weak over the last couple weeks and she has been more short of breath. She is no new focal weakness or numbness to the right leg. There is no color change to the right leg. There is no fever no trauma. There is no rash. There is no treatment prior to arrival but the  patient states she is taking her Demadex 10 mg 3 times daily like she should. Past Medical History  Diagnosis Date  . IDDM 08/09/2010  . Bell's palsy 08/09/2010  . SLE 08/09/2010  . SJOGREN'S SYNDROME 08/09/2010  . Hypertension   . High cholesterol   . CHF (congestive heart failure)   . Anginal pain   . Shortness of breath     "all the time"  . Obstructive sleep apnea on CPAP 2011  . Migraines   . Stroke 2010    "left side is still weak from it; never fully regained full strength"  . Anxiety     Past Surgical History  Procedure Date  . Ectopic pregnancy surgery 1999  . Cardiac catheterization 02/07/12  . Hernia repair 1980's    umbilical    Family History  Problem Relation Age of Onset  . Diabetes Mother   . Hypertension Other   . Stroke Other   . Arthritis Other     History  Substance Use Topics  . Smoking status: Former Smoker -- 3 years    Types: Cigarettes    Quit date: 06/01/1990  . Smokeless tobacco: Never Used  . Alcohol Use: No    OB History    Grav Para Term Preterm Abortions TAB SAB Ect Mult Living                  Review of Systems 10 Systems reviewed and are negative  for acute change except as noted in the HPI. Allergies  Oxycodone-acetaminophen  Home Medications   Current Outpatient Rx  Name  Route  Sig  Dispense  Refill  . ACETAMINOPHEN 500 MG PO TABS   Oral   Take 500-1,000 mg by mouth every 6 (six) hours as needed. For pain.         . ALBUTEROL SULFATE HFA 108 (90 BASE) MCG/ACT IN AERS   Inhalation   Inhale 2 puffs into the lungs every 6 (six) hours as needed. For shortness of breath/wheeze         . ALENDRONATE SODIUM 70 MG PO TABS   Oral   Take 70 mg by mouth every 7 (seven) days. Saturdays. Take with a full glass of water on an empty stomach.         . ALPRAZOLAM 0.5 MG PO TABS   Oral   Take 0.5 mg by mouth at bedtime.         . ASPIRIN EC 81 MG PO TBEC   Oral   Take 81 mg by mouth daily.         . AZATHIOPRINE 50 MG  PO TABS   Oral   Take 50 mg by mouth 2 (two) times daily.          Marland Kitchen CARVEDILOL 3.125 MG PO TABS   Oral   Take 3.125 mg by mouth 2 (two) times daily with a meal.         . CETIRIZINE HCL 10 MG PO TABS   Oral   Take 10 mg by mouth daily.         . CYCLOSPORINE 0.05 % OP EMUL   Both Eyes   Place 1 drop into both eyes 3 (three) times daily.         . ETONOGESTREL-ETHINYL ESTRADIOL 0.12-0.015 MG/24HR VA RING   Vaginal   Place 1 each vaginally every 28 (twenty-eight) days. Insert vaginally and leave in place for 3 consecutive weeks, then remove for 1 week.         Marland Kitchen FAMOTIDINE 20 MG PO TABS   Oral   Take 20 mg by mouth 2 (two) times daily.         Marland Kitchen GABAPENTIN 300 MG PO CAPS   Oral   Take 300 mg by mouth 2 (two) times daily.          Marland Kitchen HYDROXYCHLOROQUINE SULFATE 200 MG PO TABS   Oral   Take 200 mg by mouth 2 (two) times daily.          . INSULIN REGULAR HUMAN (CONC) 500 UNIT/ML [place] SOLN   Subcutaneous   Inject 150-175 Units into the skin 3 (three) times daily. 3x a day (just before each meal) 150-150-175 units, and 1 cc syringes 3/day         . ISOSORB DINITRATE-HYDRALAZINE 20-37.5 MG PO TABS   Oral   Take 1 tablet by mouth 3 (three) times daily.         Marland Kitchen LOSARTAN POTASSIUM 100 MG PO TABS   Oral   Take 50 mg by mouth daily.          Marland Kitchen POTASSIUM CHLORIDE CRYS ER 20 MEQ PO TBCR   Oral   Take 20 mEq by mouth 3 (three) times daily.          Marland Kitchen PRAVASTATIN SODIUM 10 MG PO TABS   Oral   Take 10 mg by mouth at bedtime.           Marland Kitchen  PREDNISONE 10 MG PO TABS   Oral   Take 10 mg by mouth daily.         . TORSEMIDE 10 MG PO TABS   Oral   Take 10 mg by mouth 3 (three) times daily.          . TRAZODONE HCL 100 MG PO TABS   Oral   Take 100 mg by mouth at bedtime.         . INSULIN SYRINGE 29G X 1/2" 1 ML MISC      Use as directed three times daily. Diag. Code 250.0   100 each   5     BP 125/68  Pulse 82  Temp 98.5 F (36.9 C)  (Oral)  Resp 17  SpO2 100%  LMP 11/14/2011  Physical Exam  Nursing note and vitals reviewed. Constitutional:       Awake, alert, nontoxic appearance.  HENT:  Head: Atraumatic.  Eyes: Right eye exhibits no discharge. Left eye exhibits no discharge.  Neck: Neck supple.  Cardiovascular: Normal rate and regular rhythm.   No murmur heard. Pulmonary/Chest: Breath sounds normal. She is in respiratory distress. She has no wheezes. She has no rales. She exhibits no tenderness.       Speaking sentences at rest causes moderate respiratory distress with tachypnea but lung sounds are quite distant with no audible crackles or wheezing heard although the patient is morbidly obese limiting the examination.  Abdominal: Soft. There is no tenderness. There is no rebound.  Musculoskeletal: She exhibits edema and tenderness.       Baseline ROM, no obvious new focal weakness. Lumbar back is diffusely tender baseline for patient. Right lateral hip is tender without rash. Right thigh is otherwise nontender. Both legs have moderate edema which is worse than baseline for patient. Patient has baseline light touch and movement of both legs.  Neurological: She is alert.       Mental status and motor strength appears baseline for patient and situation.  Skin: No rash noted.  Psychiatric: She has a normal mood and affect.    ED Course  Procedures (including critical care time) ECG: Sinus rhythm, ventricular rate 93, normal axis, normal intervals, diffuse T wave abnormality, no significant change compared with July 2013  Pt states voiding well in ED, feels better and well enough for home. Labs Reviewed  CBC - Abnormal; Notable for the following:    WBC 13.9 (*)     RBC 3.78 (*)     Hemoglobin 10.5 (*)     HCT 32.3 (*)     All other components within normal limits  BASIC METABOLIC PANEL - Abnormal; Notable for the following:    GFR calc non Af Amer 73 (*)     GFR calc Af Amer 85 (*)     All other components  within normal limits  PRO B NATRIURETIC PEPTIDE  TROPONIN I  D-DIMER, QUANTITATIVE  LAB REPORT - SCANNED   Dg Chest Port 1 View  07/30/2012  *RADIOLOGY REPORT*  Clinical Data: 41 year old female chest pain shortness of breath cough chest tightness.  PORTABLE CHEST - 1 VIEW  Comparison: 01/20/2012 and earlier.  Findings: Portable semi upright AP view 1705 hours. Stable cardiomegaly and mediastinal contours.  Stable bibasilar chronic hypoventilation.  Vascular congestion has mildly increased.  No overt edema.  No pleural effusion or pneumothorax. Visualized tracheal air column is within normal limits.  IMPRESSION: Chronic cardiomegaly and basilar hypoventilation.  Increased pulmonary vascular congestion without overt  edema.   Original Report Authenticated By: Erskine Speed, M.D.      1. Shortness of breath   2. Edema       MDM  Patient / Family / Caregiver informed of clinical course, understand medical decision-making process, and agree with plan.I doubt any other EMC precluding discharge at this time including, but not necessarily limited to the following:ACS.        Hurman Horn, MD 07/31/12 [phone]

## 2012-07-30 NOTE — ED Notes (Signed)
Pt states since yesterday had mid sternal chest pain w/ shortness of breath, hand have numbness, nausea, denies vomiting or blurred vision, hx of CHG on home oxygen. Pt states chest is tight, 10/10. Pt also complaining of R thigh pain, tender to touch and swelling to bil legs.

## 2012-08-01 DIAGNOSIS — R160 Hepatomegaly, not elsewhere classified: Secondary | ICD-10-CM

## 2012-08-01 HISTORY — DX: Hepatomegaly, not elsewhere classified: R16.0

## 2012-08-10 ENCOUNTER — Encounter: Payer: Self-pay | Admitting: Endocrinology

## 2012-08-10 ENCOUNTER — Ambulatory Visit (INDEPENDENT_AMBULATORY_CARE_PROVIDER_SITE_OTHER): Payer: Medicaid Other | Admitting: Endocrinology

## 2012-08-10 VITALS — BP 138/70 | HR 70 | Temp 98.1°F | Wt >= 6400 oz

## 2012-08-10 DIAGNOSIS — E119 Type 2 diabetes mellitus without complications: Secondary | ICD-10-CM

## 2012-08-10 NOTE — Progress Notes (Signed)
Subjective:    Patient ID: [name], female    DOB: 22-Nov-1970, 42 y.o.   MRN: [id number]  HPI Pt returns for f/u of insulin-requiring DM (dx'ed 2011; no known complications).  no cbg record, but states cbg's vary from 70 up to 200.  It is in general higher as the day goes on. She has fatigue.  She is on prednisone, 10 mg QD.  Since last ov, it was increased, and is now being tapered again.   Past Medical History  Diagnosis Date  . IDDM 08/09/2010  . Bell's palsy 08/09/2010  . SLE 08/09/2010  . SJOGREN'S SYNDROME 08/09/2010  . Hypertension   . High cholesterol   . CHF (congestive heart failure)   . Anginal pain   . Shortness of breath     "all the time"  . Obstructive sleep apnea on CPAP 2011  . Migraines   . Stroke 2010    "left side is still weak from it; never fully regained full strength"  . Anxiety     Past Surgical History  Procedure Date  . Ectopic pregnancy surgery 1999  . Cardiac catheterization 02/07/12  . Hernia repair 1980's    umbilical    History   Social History  . Marital Status: Single    Spouse Name: N/A    Number of Children: N/A  . Years of Education: N/A   Occupational History  .      student   Social History Main Topics  . Smoking status: Former Smoker -- 3 years    Types: Cigarettes    Quit date: 06/01/1990  . Smokeless tobacco: Never Used  . Alcohol Use: No  . Drug Use: No  . Sexually Active: No   Other Topics Concern  . Not on file   Social History Narrative   Regular exercise-yes    Current Outpatient Prescriptions on File Prior to Visit  Medication Sig Dispense Refill  . acetaminophen (TYLENOL) 500 MG tablet Take 500-1,000 mg by mouth every 6 (six) hours as needed. For pain.      Marland Kitchen albuterol (PROAIR HFA) 108 (90 BASE) MCG/ACT inhaler Inhale 2 puffs into the lungs every 6 (six) hours as needed. For shortness of breath/wheeze      . alendronate (FOSAMAX) 70 MG tablet Take 70 mg by mouth every 7 (seven) days. Saturdays. Take with a  full glass of water on an empty stomach.      . ALPRAZolam (XANAX) 0.5 MG tablet Take 0.5 mg by mouth at bedtime.      Marland Kitchen aspirin EC 81 MG tablet Take 81 mg by mouth daily.      Marland Kitchen azaTHIOprine (IMURAN) 50 MG tablet Take 50 mg by mouth 2 (two) times daily.       . carvedilol (COREG) 3.125 MG tablet Take 3.125 mg by mouth 2 (two) times daily with a meal.      . cetirizine (ZYRTEC) 10 MG tablet Take 10 mg by mouth daily.      . cycloSPORINE (RESTASIS) 0.05 % ophthalmic emulsion Place 1 drop into both eyes 3 (three) times daily.      Marland Kitchen etonogestrel-ethinyl estradiol (NUVARING) 0.12-0.015 MG/24HR vaginal ring Place 1 each vaginally every 28 (twenty-eight) days. Insert vaginally and leave in place for 3 consecutive weeks, then remove for 1 week.      . famotidine (PEPCID) 20 MG tablet Take 20 mg by mouth 2 (two) times daily.      Marland Kitchen gabapentin (NEURONTIN) 300 MG capsule Take  300 mg by mouth 2 (two) times daily.       . hydroxychloroquine (PLAQUENIL) 200 MG tablet Take 200 mg by mouth 2 (two) times daily.       . insulin regular human CONCENTRATED (HUMULIN R) 500 UNIT/ML SOLN injection Inject 150-175 Units into the skin 3 (three) times daily. 3x a day (just before each meal) 150-150-175 units, and 1 cc syringes 3/day      . INSULIN SYRINGE 1CC/29G (B-D INSULIN SYRINGE) 29G X 1/2" 1 ML MISC Use as directed three times daily. Diag. Code 250.0  100 each  5  . isosorbide-hydrALAZINE (BIDIL) 20-37.5 MG per tablet Take 1 tablet by mouth 3 (three) times daily.      Marland Kitchen losartan (COZAAR) 100 MG tablet Take 50 mg by mouth daily.       . potassium chloride SA (K-DUR,KLOR-CON) 20 MEQ tablet Take 20 mEq by mouth 3 (three) times daily.       . pravastatin (PRAVACHOL) 10 MG tablet Take 10 mg by mouth at bedtime.        . predniSONE (DELTASONE) 10 MG tablet Take 10 mg by mouth daily.      Marland Kitchen torsemide (DEMADEX) 10 MG tablet Take 10 mg by mouth 3 (three) times daily.       . traZODone (DESYREL) 100 MG tablet Take 100 mg by  mouth at bedtime.        Allergies  Allergen Reactions  . Oxycodone-Acetaminophen Hives    Family History  Problem Relation Age of Onset  . Diabetes Mother   . Hypertension Other   . Stroke Other   . Arthritis Other    BP 138/70  Pulse 70  Temp 98.1 F (36.7 C) (Oral)  Wt 429 lb (194.593 kg)  SpO2 98%  LMP 11/14/2011  Review of Systems denies hypoglycemia    Objective:   Physical Exam VITAL SIGNS:  See vs page GENERAL: no distress.  Morbid obesity SKIN:  Insulin injection sites at the triceps areas are normal.  Lab Results  Component Value Date   HGBA1C 7.6* 08/10/2012      Assessment & Plan:  DM, needs increased rx.

## 2012-08-10 NOTE — Patient Instructions (Addendum)
please continue  "U-500"  three times a day (just before each meal) 150-150-175 units (because it is 5x concentrated, draw it up as though it is 30-30-35 units).   check your blood sugar 2 times a day.  vary the time of day when you check, between before the 3 meals, and at bedtime.  also check if you have symptoms of your blood sugar being too high or too low.  please keep a record of the readings and bring it to your next appointment here.  please call us sooner if you are having low blood sugar episodes, or if it stays over 200.   Please come back for a follow-up appointment in 3 months.

## 2012-09-06 DIAGNOSIS — M3320 Polymyositis, organ involvement unspecified: Secondary | ICD-10-CM | POA: Insufficient documentation

## 2012-09-15 ENCOUNTER — Other Ambulatory Visit: Payer: Self-pay

## 2012-10-09 ENCOUNTER — Inpatient Hospital Stay (HOSPITAL_COMMUNITY)
Admission: EM | Admit: 2012-10-09 | Discharge: 2012-10-12 | DRG: 065 | Disposition: A | Discharge status: Home Health | Payer: Medicaid Other | Attending: Internal Medicine | Admitting: Internal Medicine

## 2012-10-09 ENCOUNTER — Emergency Department (HOSPITAL_COMMUNITY): Payer: Medicaid Other

## 2012-10-09 ENCOUNTER — Encounter (HOSPITAL_COMMUNITY): Payer: Self-pay | Admitting: Emergency Medicine

## 2012-10-09 DIAGNOSIS — D72829 Elevated white blood cell count, unspecified: Secondary | ICD-10-CM

## 2012-10-09 DIAGNOSIS — Z23 Encounter for immunization: Secondary | ICD-10-CM

## 2012-10-09 DIAGNOSIS — I639 Cerebral infarction, unspecified: Secondary | ICD-10-CM

## 2012-10-09 DIAGNOSIS — J189 Pneumonia, unspecified organism: Secondary | ICD-10-CM

## 2012-10-09 DIAGNOSIS — Z6841 Body Mass Index (BMI) 40.0 and over, adult: Secondary | ICD-10-CM

## 2012-10-09 DIAGNOSIS — R0602 Shortness of breath: Secondary | ICD-10-CM

## 2012-10-09 DIAGNOSIS — Z8673 Personal history of transient ischemic attack (TIA), and cerebral infarction without residual deficits: Secondary | ICD-10-CM

## 2012-10-09 DIAGNOSIS — I5023 Acute on chronic systolic (congestive) heart failure: Secondary | ICD-10-CM

## 2012-10-09 DIAGNOSIS — J9610 Chronic respiratory failure, unspecified whether with hypoxia or hypercapnia: Secondary | ICD-10-CM

## 2012-10-09 DIAGNOSIS — E119 Type 2 diabetes mellitus without complications: Secondary | ICD-10-CM

## 2012-10-09 DIAGNOSIS — M329 Systemic lupus erythematosus, unspecified: Secondary | ICD-10-CM

## 2012-10-09 DIAGNOSIS — Z794 Long term (current) use of insulin: Secondary | ICD-10-CM

## 2012-10-09 DIAGNOSIS — Z885 Allergy status to narcotic agent status: Secondary | ICD-10-CM

## 2012-10-09 DIAGNOSIS — G510 Bell's palsy: Secondary | ICD-10-CM

## 2012-10-09 DIAGNOSIS — M3500 Sicca syndrome, unspecified: Secondary | ICD-10-CM

## 2012-10-09 DIAGNOSIS — Z79899 Other long term (current) drug therapy: Secondary | ICD-10-CM

## 2012-10-09 DIAGNOSIS — E785 Hyperlipidemia, unspecified: Secondary | ICD-10-CM | POA: Diagnosis present

## 2012-10-09 DIAGNOSIS — D649 Anemia, unspecified: Secondary | ICD-10-CM

## 2012-10-09 DIAGNOSIS — G4733 Obstructive sleep apnea (adult) (pediatric): Secondary | ICD-10-CM | POA: Diagnosis present

## 2012-10-09 DIAGNOSIS — G8190 Hemiplegia, unspecified affecting unspecified side: Secondary | ICD-10-CM | POA: Diagnosis present

## 2012-10-09 DIAGNOSIS — Z823 Family history of stroke: Secondary | ICD-10-CM

## 2012-10-09 DIAGNOSIS — I6350 Cerebral infarction due to unspecified occlusion or stenosis of unspecified cerebral artery: Principal | ICD-10-CM | POA: Diagnosis present

## 2012-10-09 DIAGNOSIS — R0789 Other chest pain: Secondary | ICD-10-CM | POA: Diagnosis present

## 2012-10-09 DIAGNOSIS — F411 Generalized anxiety disorder: Secondary | ICD-10-CM | POA: Diagnosis present

## 2012-10-09 DIAGNOSIS — I10 Essential (primary) hypertension: Secondary | ICD-10-CM

## 2012-10-09 DIAGNOSIS — I5032 Chronic diastolic (congestive) heart failure: Secondary | ICD-10-CM

## 2012-10-09 DIAGNOSIS — Z87891 Personal history of nicotine dependence: Secondary | ICD-10-CM

## 2012-10-09 DIAGNOSIS — I509 Heart failure, unspecified: Secondary | ICD-10-CM | POA: Diagnosis present

## 2012-10-09 DIAGNOSIS — E109 Type 1 diabetes mellitus without complications: Secondary | ICD-10-CM

## 2012-10-09 DIAGNOSIS — R1319 Other dysphagia: Secondary | ICD-10-CM | POA: Diagnosis present

## 2012-10-09 DIAGNOSIS — R079 Chest pain, unspecified: Secondary | ICD-10-CM

## 2012-10-09 DIAGNOSIS — R471 Dysarthria and anarthria: Secondary | ICD-10-CM | POA: Diagnosis present

## 2012-10-09 DIAGNOSIS — J9611 Chronic respiratory failure with hypoxia: Secondary | ICD-10-CM

## 2012-10-09 LAB — COMPREHENSIVE METABOLIC PANEL
ALT: 12 U/L (ref 0–35)
AST: 15 U/L (ref 0–37)
Albumin: 2.9 g/dL — ABNORMAL LOW (ref 3.5–5.2)
Alkaline Phosphatase: 78 U/L (ref 39–117)
Calcium: 9.6 mg/dL (ref 8.4–10.5)
GFR calc Af Amer: 69 mL/min — ABNORMAL LOW (ref >90)
Potassium: 3.7 mEq/L (ref 3.5–5.1)
Sodium: 138 mEq/L (ref 135–145)
Total Protein: 7.5 g/dL (ref 6.0–8.3)

## 2012-10-09 LAB — POCT I-STAT TROPONIN I: Troponin i, poc: 0.02 ng/mL (ref 0.00–0.08)

## 2012-10-09 LAB — CREATININE, SERUM
Creatinine, Ser: 1.2 mg/dL — ABNORMAL HIGH (ref 0.50–1.10)
GFR calc non Af Amer: 55 mL/min — ABNORMAL LOW (ref >90)

## 2012-10-09 LAB — CBC WITH DIFFERENTIAL/PLATELET
Basophils Absolute: 0 10*3/uL (ref 0.0–0.1)
Basophils Relative: 0 % (ref 0–1)
Eosinophils Absolute: 0.3 10*3/uL (ref 0.0–0.7)
Eosinophils Relative: 2 % (ref 0–5)
Lymphs Abs: 2.6 10*3/uL (ref 0.7–4.0)
MCH: 27.1 pg (ref 26.0–34.0)
MCV: 84.2 fL (ref 78.0–100.0)
Neutrophils Relative %: 73 % (ref 43–77)
Platelets: 349 10*3/uL (ref 150–400)
RBC: 3.98 MIL/uL (ref 3.87–5.11)
RDW: 14.2 % (ref 11.5–15.5)

## 2012-10-09 LAB — PROTIME-INR: INR: 0.97 (ref 0.00–1.49)

## 2012-10-09 LAB — CBC
Hemoglobin: 10.4 g/dL — ABNORMAL LOW (ref 12.0–15.0)
MCHC: 33.2 g/dL (ref 30.0–36.0)
RBC: 3.75 MIL/uL — ABNORMAL LOW (ref 3.87–5.11)

## 2012-10-09 LAB — TROPONIN I: Troponin I: <0.3 ng/mL (ref <0.30)

## 2012-10-09 LAB — GLUCOSE, CAPILLARY
Glucose-Capillary: 94 mg/dL (ref 70–99)
Glucose-Capillary: 109 mg/dL — ABNORMAL HIGH (ref 70–99)

## 2012-10-09 LAB — APTT: aPTT: 30 seconds (ref 24–37)

## 2012-10-09 MED ORDER — ASPIRIN 325 MG PO TABS
325.0000 mg | ORAL_TABLET | Freq: Every day | ORAL | Status: DC
  Administered 2012-10-10: 325 mg via ORAL
  Filled 2012-10-09: qty 1

## 2012-10-09 MED ORDER — CYCLOSPORINE 0.05 % OP EMUL
1.0000 [drp] | Freq: Three times a day (TID) | OPHTHALMIC | Status: DC
  Administered 2012-10-09 – 2012-10-11 (×6): 1 [drp] via OPHTHALMIC
  Administered 2012-10-12: 10:00:00 via OPHTHALMIC
  Filled 2012-10-09 (×11): qty 1

## 2012-10-09 MED ORDER — ENOXAPARIN SODIUM 40 MG/0.4ML ~~LOC~~ SOLN
40.0000 mg | SUBCUTANEOUS | Status: DC
  Administered 2012-10-09 – 2012-10-11 (×3): 40 mg via SUBCUTANEOUS
  Filled 2012-10-09 (×4): qty 0.4

## 2012-10-09 MED ORDER — ONDANSETRON HCL 4 MG/2ML IJ SOLN
4.0000 mg | Freq: Four times a day (QID) | INTRAMUSCULAR | Status: DC | PRN
  Administered 2012-10-10 – 2012-10-11 (×3): 4 mg via INTRAVENOUS
  Filled 2012-10-09 (×4): qty 2

## 2012-10-09 MED ORDER — SODIUM CHLORIDE 0.9 % IV SOLN
INTRAVENOUS | Status: DC
  Administered 2012-10-09 – 2012-10-11 (×3): via INTRAVENOUS

## 2012-10-09 MED ORDER — INFLUENZA VIRUS VACC SPLIT PF IM SUSP
0.5000 mL | INTRAMUSCULAR | Status: AC
  Administered 2012-10-10: 0.5 mL via INTRAMUSCULAR
  Filled 2012-10-09: qty 0.5

## 2012-10-09 MED ORDER — ASPIRIN 300 MG RE SUPP
300.0000 mg | Freq: Every day | RECTAL | Status: DC
  Administered 2012-10-09: 300 mg via RECTAL
  Filled 2012-10-09 (×2): qty 1

## 2012-10-09 MED ORDER — ALBUTEROL SULFATE HFA 108 (90 BASE) MCG/ACT IN AERS: 2.0000 | INHALATION_SPRAY | Freq: Four times a day (QID) | RESPIRATORY_TRACT | Status: DC | PRN

## 2012-10-09 MED ORDER — SODIUM CHLORIDE 0.9 % IV SOLN
INTRAVENOUS | Status: DC
  Administered 2012-10-09: 19:00:00 via INTRAVENOUS

## 2012-10-09 MED ORDER — INSULIN ASPART 100 UNIT/ML ~~LOC~~ SOLN
0.0000 [IU] | Freq: Four times a day (QID) | SUBCUTANEOUS | Status: DC
  Administered 2012-10-10 – 2012-10-11 (×3): 4 [IU] via SUBCUTANEOUS

## 2012-10-09 NOTE — ED Provider Notes (Signed)
History     CSN: [id number]  Arrival date & time 10/09/12  1621   First MD Initiated Contact with Patient 10/09/12 1646      Chief Complaint  Patient presents with  . Facial Droop  . Stroke Symptoms    (Consider location/radiation/quality/duration/timing/severity/associated sxs/prior treatment) The history is provided by the patient.   patient here with left-sided facial droop and left upper extremity weakness since awakening from her nap today at 3:00. She was last seen normal at 12:30. She awoke she had new onset facial droop as well as a headache. Has a prior history of CVA with left-sided deficits. She also notes some shortness of breath and chest tightness. She is on home oxygen for her history of CHF. Denies any new peripheral edema. Called her Dr. was told to come here. Past Medical History  Diagnosis Date  . IDDM 08/09/2010  . Bell's palsy 08/09/2010  . SLE 08/09/2010  . SJOGREN'S SYNDROME 08/09/2010  . Hypertension   . High cholesterol   . CHF (congestive heart failure)   . Anginal pain   . Shortness of breath     "all the time"  . Obstructive sleep apnea on CPAP 2011  . Migraines   . Stroke 2010    "left side is still weak from it; never fully regained full strength"  . Anxiety     Past Surgical History  Procedure Laterality Date  . Ectopic pregnancy surgery  1999  . Cardiac catheterization  02/07/12  . Hernia repair  1980's    umbilical    Family History  Problem Relation Age of Onset  . Diabetes Mother   . Hypertension Other   . Stroke Other   . Arthritis Other     History  Substance Use Topics  . Smoking status: Former Smoker -- 3 years    Types: Cigarettes    Quit date: 06/01/1990  . Smokeless tobacco: Never Used  . Alcohol Use: No    OB History   Grav Para Term Preterm Abortions TAB SAB Ect Mult Living                  Review of Systems  All other systems reviewed and are negative.    Allergies  Oxycodone-acetaminophen  Home Medications    Current Outpatient Rx  Name  Route  Sig  Dispense  Refill  . acetaminophen (TYLENOL) 500 MG tablet   Oral   Take 500-1,000 mg by mouth every 6 (six) hours as needed. For pain.         Marland Kitchen albuterol (PROAIR HFA) 108 (90 BASE) MCG/ACT inhaler   Inhalation   Inhale 2 puffs into the lungs every 6 (six) hours as needed. For shortness of breath/wheeze         . alendronate (FOSAMAX) 70 MG tablet   Oral   Take 70 mg by mouth every 7 (seven) days. Saturdays. Take with a full glass of water on an empty stomach.         . ALPRAZolam (XANAX) 0.5 MG tablet   Oral   Take 0.5 mg by mouth at bedtime.         Marland Kitchen aspirin EC 81 MG tablet   Oral   Take 81 mg by mouth daily.         Marland Kitchen azaTHIOprine (IMURAN) 50 MG tablet   Oral   Take 50 mg by mouth 2 (two) times daily.          . carvedilol (COREG)  3.125 MG tablet   Oral   Take 3.125 mg by mouth 2 (two) times daily with a meal.         . cetirizine (ZYRTEC) 10 MG tablet   Oral   Take 10 mg by mouth daily.         . cycloSPORINE (RESTASIS) 0.05 % ophthalmic emulsion   Both Eyes   Place 1 drop into both eyes 3 (three) times daily.         Marland Kitchen etonogestrel-ethinyl estradiol (NUVARING) 0.12-0.015 MG/24HR vaginal ring   Vaginal   Place 1 each vaginally every 28 (twenty-eight) days. Insert vaginally and leave in place for 3 consecutive weeks, then remove for 1 week.         . famotidine (PEPCID) 20 MG tablet   Oral   Take 20 mg by mouth 2 (two) times daily.         Marland Kitchen gabapentin (NEURONTIN) 300 MG capsule   Oral   Take 300 mg by mouth 2 (two) times daily.          . hydroxychloroquine (PLAQUENIL) 200 MG tablet   Oral   Take 200 mg by mouth 2 (two) times daily.          . insulin regular human CONCENTRATED (HUMULIN R) 500 UNIT/ML SOLN injection   Subcutaneous   Inject 150-175 Units into the skin 3 (three) times daily. 3x a day (just before each meal) 150-150-175 units, and 1 cc syringes 3/day         . INSULIN  SYRINGE 1CC/29G (B-D INSULIN SYRINGE) 29G X 1/2" 1 ML MISC      Use as directed three times daily. Diag. Code 250.0   100 each   5   . isosorbide-hydrALAZINE (BIDIL) 20-37.5 MG per tablet   Oral   Take 1 tablet by mouth 3 (three) times daily.         Marland Kitchen losartan (COZAAR) 100 MG tablet   Oral   Take 50 mg by mouth daily.          . potassium chloride SA (K-DUR,KLOR-CON) 20 MEQ tablet   Oral   Take 20 mEq by mouth 3 (three) times daily.          . pravastatin (PRAVACHOL) 10 MG tablet   Oral   Take 10 mg by mouth at bedtime.           . predniSONE (DELTASONE) 10 MG tablet   Oral   Take 10 mg by mouth daily.         Marland Kitchen torsemide (DEMADEX) 10 MG tablet   Oral   Take 10 mg by mouth 3 (three) times daily.          . traZODone (DESYREL) 100 MG tablet   Oral   Take 100 mg by mouth at bedtime.           BP 118/49  Pulse 94  Temp(Src) 98 F (36.7 C) (Oral)  Resp 24  SpO2 100%  LMP 11/14/2011  Physical Exam  Nursing note and vitals reviewed. Constitutional: She is oriented to person, place, and time. She appears well-developed and well-nourished.  Non-toxic appearance. No distress.  HENT:  Head: Normocephalic and atraumatic.  Eyes: Conjunctivae, EOM and lids are normal. Pupils are equal, round, and reactive to light.  Neck: Normal range of motion. Neck supple. No tracheal deviation present. No mass present.  Cardiovascular: Normal rate, regular rhythm and normal heart sounds.  Exam reveals no gallop.   No  murmur heard. Pulmonary/Chest: Effort normal and breath sounds normal. No stridor. No respiratory distress. She has no decreased breath sounds. She has no wheezes. She has no rhonchi. She has no rales.  Abdominal: Soft. Normal appearance and bowel sounds are normal. She exhibits no distension. There is no tenderness. There is no rebound and no CVA tenderness.  Musculoskeletal: Normal range of motion. She exhibits no edema and no tenderness.  Neurological: She  is alert and oriented to person, place, and time. She has normal strength. A cranial nerve deficit is present. No sensory deficit. Coordination abnormal. GCS eye subscore is 4. GCS verbal subscore is 5. GCS motor subscore is 6.  Left-sided facial drooping noted. Some dysarthria. 3/5 strength left upper and left lower extremities.  Skin: Skin is warm and dry. No abrasion and no rash noted.  Psychiatric: She has a normal mood and affect. Her speech is normal and behavior is normal.    ED Course  Procedures (including critical care time)  Labs Reviewed  CBC WITH DIFFERENTIAL  COMPREHENSIVE METABOLIC PANEL  PRO B NATRIURETIC PEPTIDE  PROTIME-INR  APTT   No results found.   No diagnosis found.    MDM   Date: 10/09/2012  Rate: 93  Rhythm: normal sinus rhythm  QRS Axis: normal  Intervals: normal  ST/T Wave abnormalities: nonspecific T wave changes  Conduction Disutrbances:none  Narrative Interpretation:   Old EKG Reviewed: unchanged  6:32 PM Spoke with Dr. Roseanne Reno from neurology and he is requesting that the patient be sent to [facility] for admission to the hospitalist service for further evaluation workup. She is outside the TPA window at this time        Toy Baker, MD 10/09/12 [phone]

## 2012-10-09 NOTE — ED Notes (Signed)
Bed:RESB<BR> Expected date:<BR> Expected time:<BR> Means of arrival:<BR> Comments:<BR> hold

## 2012-10-09 NOTE — H&P (Signed)
History and Physical  [name] ZOX:[id number] DOB: 1971/08/01 DOA: 10/09/2012  Referring physician: Lorre Nick <MD PCP: Ron Parker, MD  Cardiologist: Pamella Pert, MD  Chief Complaint: Left face weakness  HPI:  42 year old woman presented to the emergency department with report of sudden onset left facial weakness, left upper and left lower extremity weakness. Initial evaluation in the emergency department was negative and patient was referred for admission to [facility] for neurology consultation and further evaluation.  Patient reports she felt fine today and took a nap at about noon. When she woke up approximately 3 PM she noted the left side of her face was weak, her speech was slurred and she had weakness of her left arm and leg. Family reports this weakness is new. She does report a history of left-sided weakness of unclear severity. Review of primary care notes over the last several months unrevealing. Of interest they Bell's palsy as listed in the past but family notes again that the left-sided weakness is clearly new.  After she woke up she did have some chest pain. She reports the chest pain has been going on on a daily basis for the last several weeks and occurs multiple times a day without specific aggravating or alleviating factors. It is described as being sharp and responds spontaneously.  In ED  Afebrile, VSS  Screening labs notable for--WBC 15.6, Hgb 10.8  CT head negative  EKG--SR, no acute changes  CXR--pulmonary vascular congestion  Neurology requested transfer to [facility]  Review of Systems:  Negative for fever, visual changes, sore throat, rash, new muscle aches,SOB, dysuria, bleeding, n/v/abdominal pain.  Positive for hot and cold spells.  Past Medical History  Diagnosis Date  . IDDM 08/09/2010  . Bell's palsy 08/09/2010  . SLE 08/09/2010  . SJOGREN'S SYNDROME 08/09/2010  . Hypertension   . High cholesterol   . CHF (congestive heart failure)   .  Anginal pain   . Shortness of breath     "all the time"  . Obstructive sleep apnea on CPAP 2011  . Migraines   . Stroke 2010    "left side is still weak from it; never fully regained full strength"  . Anxiety     Past Surgical History  Procedure Laterality Date  . Ectopic pregnancy surgery  1999  . Cardiac catheterization  02/07/12  . Hernia repair  1980's    umbilical    Social History:  reports that she quit smoking about 22 years ago. Her smoking use included Cigarettes. She smoked 0.00 packs per day for 3 years. She has never used smokeless tobacco. She reports that she does not drink alcohol or use illicit drugs.  Allergies  Allergen Reactions  . Oxycodone-Acetaminophen Hives    Family History  Problem Relation Age of Onset  . Diabetes Mother   . Hypertension Other   . Stroke Other   . Arthritis Other      Prior to Admission medications   Medication Sig Start Date End Date Taking? Authorizing Edgerrin Correia  acetaminophen (TYLENOL) 500 MG tablet Take 500-1,000 mg by mouth every 6 (six) hours as needed. For pain.   Yes Historical Debraann Livingstone, MD  albuterol (PROAIR HFA) 108 (90 BASE) MCG/ACT inhaler Inhale 2 puffs into the lungs every 6 (six) hours as needed. For shortness of breath/wheeze   Yes Historical Katianna Mcclenney, MD  alendronate (FOSAMAX) 70 MG tablet Take 70 mg by mouth every 7 (seven) days. thursdays. Take with a full glass of water on an empty  stomach.   Yes Historical Janilah Hojnacki, MD  ALPRAZolam Prudy Feeler) 0.5 MG tablet Take 0.5 mg by mouth at bedtime.   Yes Historical Tequia Wolman, MD  aspirin EC 81 MG tablet Take 81 mg by mouth daily.   Yes Historical Jill Stopka, MD  azaTHIOprine (IMURAN) 50 MG tablet Take 50 mg by mouth 2 (two) times daily.    Yes Historical Takisha Pelle, MD  buPROPion (WELLBUTRIN SR) 150 MG 12 hr tablet Take 150 mg by mouth 2 (two) times daily.   Yes Historical Lovelee Forner, MD  carvedilol (COREG) 3.125 MG tablet Take 3.125 mg by mouth 2 (two) times daily with a meal.   Yes  Historical Fermon Ureta, MD  cetirizine (ZYRTEC) 10 MG tablet Take 10 mg by mouth daily.   Yes Historical Annalysa Mohammad, MD  cycloSPORINE (RESTASIS) 0.05 % ophthalmic emulsion Place 1 drop into both eyes 3 (three) times daily.   Yes Historical Disa Riedlinger, MD  famotidine (PEPCID) 20 MG tablet Take 20 mg by mouth 2 (two) times daily.   Yes Historical Edra Riccardi, MD  gabapentin (NEURONTIN) 300 MG capsule Take 300 mg by mouth 2 (two) times daily.    Yes Historical Telesia Ates, MD  hydroxychloroquine (PLAQUENIL) 200 MG tablet Take 200 mg by mouth 2 (two) times daily.    Yes Historical Raiyan Dalesandro, MD  insulin regular human CONCENTRATED (HUMULIN R) 500 UNIT/ML SOLN injection Inject 150-175 Units into the skin 3 (three) times daily. 3x a day (just before each meal) 150-150-175 units, and 1 cc syringes 3/day 02/22/12  Yes Romero Belling, MD  INSULIN SYRINGE 1CC/29G (B-D INSULIN SYRINGE) 29G X 1/2" 1 ML MISC Use as directed three times daily. Diag. Code 250.0 07/10/12  Yes Romero Belling, MD  isosorbide-hydrALAZINE (BIDIL) 20-37.5 MG per tablet Take 1 tablet by mouth 3 (three) times daily.   Yes Historical Ronique Simerly, MD  losartan (COZAAR) 100 MG tablet Take 50 mg by mouth daily.    Yes Historical Zeb Rawl, MD  potassium chloride SA (K-DUR,KLOR-CON) 20 MEQ tablet Take 20 mEq by mouth 3 (three) times daily.    Yes Historical Zorion Nims, MD  pravastatin (PRAVACHOL) 10 MG tablet Take 10 mg by mouth at bedtime.     Yes Historical Granite Godman, MD  torsemide (DEMADEX) 10 MG tablet Take 10 mg by mouth 3 (three) times daily.    Yes Historical Locklan Canoy, MD  traZODone (DESYREL) 100 MG tablet Take 100 mg by mouth at bedtime.   Yes Historical Linley Moskal, MD  etonogestrel-ethinyl estradiol (NUVARING) 0.12-0.015 MG/24HR vaginal ring Place 1 each vaginally every 28 (twenty-eight) days. Insert vaginally and leave in place for 3 consecutive weeks, then remove for 1 week.    Historical Kirkland Figg, MD   Physical Exam: Filed Vitals:   10/09/12 1640 10/09/12  1730 10/09/12 1745 10/09/12 1757  BP: 118/49 103/55 118/51 111/48  Pulse: 94  89 90  Temp:    98 F (36.7 C)  TempSrc:    Oral  Resp: 24 20 20 23   SpO2: 100%  99% 93%    General:  Examined in emergency department. Appears calm and comfortable. Obvious marked left facial weakness and dysarthria. Eyes: PERRL, left lid droops. Irises appear unremarkable. ENT: grossly normal hearing, lips & tongue Neck: no LAD, masses or thyromegaly Cardiovascular: RRR, no m/r/g. No LE edema. Respiratory: CTA bilaterally, no w/r/r. Normal respiratory effort. Abdomen: Obese, soft, ntnd Skin: no rash or induration seen on limited exam Musculoskeletal: Left upper and left lower extremity weakness noted. Difficulty raising left arm 4/5  but able to do so at  the shoulder unable to lift hand towards healing. Weak left leg 3/5 Psychiatric: grossly normal mood and affect, speech fluent and appropriate Neurologic: Obvious left facial weakness involving the upper and lower face, right tongue deviation. Slurred speech.  Wt Readings from Last 3 Encounters:  08/10/12 194.593 kg (429 lb)  05/08/12 185.521 kg (409 lb)  03/09/12 171.46 kg (378 lb)    Labs on Admission:  Basic Metabolic Panel:  Recent Labs Lab 10/09/12 1704  NA 138  K 3.7  CL 99  CO2 26  GLUCOSE 87  BUN 11  CREATININE 1.12*  CALCIUM 9.6    Liver Function Tests:  Recent Labs Lab 10/09/12 1704  AST 15  ALT 12  ALKPHOS 78  BILITOT 0.3  PROT 7.5  ALBUMIN 2.9*    CBC:  Recent Labs Lab 10/09/12 1704  WBC 15.6*  NEUTROABS 11.3*  HGB 10.8*  HCT 33.5*  MCV 84.2  PLT 349     Recent Labs  10/09/12 1709  TROPIPOC 0.02    BNP (last 3 results)  Recent Labs  11/08/11 0820 07/30/12 1731 10/09/12 1704  PROBNP 209.9* 69.2 73.4    CBG:  Recent Labs Lab 10/09/12 1720  GLUCAP 94     Radiological Exams on Admission: Dg Chest 2 View  10/09/2012  *RADIOLOGY REPORT*  Clinical Data: Code stroke, left facial droop  and left side weakness  CHEST - 2 VIEW  Comparison: 07/30/2012  Findings: Enlargement of cardiac silhouette with pulmonary vascular congestion. Mediastinal contours stable. Lungs clear. No pleural effusion or pneumothorax. Lateral view limited by underpenetration due to body habitus.  IMPRESSION: Enlargement of cardiac silhouette with pulmonary vascular congestion. No acute abnormalities.   Original Report Authenticated By: Ulyses Southward, M.D.    Ct Head (brain) Wo Contrast  10/09/2012  *RADIOLOGY REPORT*  Clinical Data: Code stroke, left side weakness, facial droop, onset of symptoms at 1500 hours  CT HEAD WITHOUT CONTRAST  Technique:  Contiguous axial images were obtained from the base of the skull through the vertex without contrast.  Comparison: 11/10/2008  Findings: Normal ventricular morphology. No midline shift or mass effect. Normal appearance of brain parenchyma. No definite intracranial hemorrhage, mass lesion or evidence of acute infarction. No extra-axial fluid collection. Mucosal retention cysts in the maxillary sinuses with additional partial opacification of bilateral ethmoid air cells and right maxillary sinus. Bones unremarkable.  IMPRESSION: No acute intracranial abnormalities.  Critical Value/emergent results were called by telephone at the time of interpretation on 10/09/2012 at 1720 hours to Dr. Freida Busman, who verbally acknowledged these results.   Original Report Authenticated By: Ulyses Southward, M.D.     EKG: Independently reviewed. As above.   Principal Problem:   CVA (cerebral infarction) Active Problems:   SJOGREN'S SYNDROME   Chest pain   Leukocytosis   Anemia   DM (diabetes mellitus)   Chronic diastolic heart failure   Chronic respiratory failure   Assessment/Plan 1. Left-sided facial droop and left upper and lower extremity weakness: Stroke workup as below. History of Bell's palsy noted with left eyelid weakness 10/2011. 2. Probable stroke: Stroke evaluation. Full-strength  aspirin. Therapy evaluations. Neurology consultation pending.  3. Chronic respiratory failure: Appears stable. Continue oxygen. Etiology unclear, patient report secondary to CHF.  4. Chronic normocytic anemia: stable 5. Chronic leukocytosis: stable. dating to 10/2008. Etiology unclear. 6. WU:[id number]. Sliding-scale insulin. Verify home insulin regimen 7. Diastolic CHF: Appears compensated. EF preserved by last echo 10/2011, grade 1 diastolic dysfunction.  8. SLE 9. Chest pain: Atypical, chronic.  Doubt ACS. Check cardiac enzymes but if negative would not pursue further evaluation. Normal cardiac catheterization 01/2012. Medical therapy with aggressive risk factor reduction recommended at that time.  10. OSA on CPAP: CPAP.  Code Status: Full code  Family Communication: Discussed with mother and brother at bedside  Disposition Plan/Anticipated LOS: Admitted to [facility], 2-3 days  Time spent: 78  minutes  Brendia Sacks, MD  [facility] Pager [phone] 10/09/2012, 6:41 PM

## 2012-10-09 NOTE — ED Notes (Signed)
[facility] was called and notified, report on pt provided.

## 2012-10-09 NOTE — ED Notes (Signed)
States that she woke up about 1500 with her face twisted with left sided droop family states that her speech was slurred as well.

## 2012-10-09 NOTE — Consult Note (Signed)
Reason for Consult:concern for stroke Referring Physician: Brendia Sacks  CC: Left sided weakness  History is obtained from:PAtietn, mother  HPI: [name] is a 42 y.o. female with a history of lupus diagnosed in 2003 who went to sleep around noon and awoke with left-sided numbness and weakness. The first ER note is at 4:24 PM and given and she was already outside of window, TPA was not considered.  She states that the symptoms started while asleep, and have been persistent since that time. She had a stroke in 2011 with subsequent left-sided weakness, however this is clearly much worse than typical for her.   LKW: Around noon tpa given: no, outside of window  ROS: A 14 point ROS was performed and is negative except as noted in the HPI.  Past Medical History  Diagnosis Date  . IDDM 08/09/2010  . Bell's palsy 08/09/2010  . SLE 08/09/2010  . SJOGREN'S SYNDROME 08/09/2010  . Hypertension   . High cholesterol   . CHF (congestive heart failure)   . Anginal pain   . Shortness of breath     "all the time"  . Obstructive sleep apnea on CPAP 2011  . Migraines   . Stroke 2010    "left side is still weak from it; never fully regained full strength"  . Anxiety     Family History: Mother-stroke  Social History: Tob: Former smoker  Exam: Current vital signs: BP 124/64  Pulse 93  Temp(Src) 99.2 F (37.3 C) (Oral)  Resp 20  Ht 5\' 8"  (1.727 m)  Wt 185 kg (407 lb 13.6 oz)  BMI 62.03 kg/m2  SpO2 100%  LMP 11/14/2011 Vital signs in last 24 hours: Temp:  [98 F (36.7 C)-99.2 F (37.3 C)] 99.2 F (37.3 C) (03/11 2059) Pulse Rate:  [62-94] 93 (03/11 2059) Resp:  [17-24] 20 (03/11 2059) BP: (103-154)/(48-74) 124/64 mmHg (03/11 2059) SpO2:  [93 %-100 %] 100 % (03/11 2059) Weight:  [185 kg (407 lb 13.6 oz)] 185 kg (407 lb 13.6 oz) (03/11 2059)  General: In bed, no apparent distress CV: Regular rate and rhythm Mental Status: Patient is awake, alert, oriented to person, place,  month, year, and situation. Immediate and remote memory are intact. Patient is able to give a clear and coherent history. Able to spell world backwards without difficulty No signs of aphasia Cranial Nerves: II: Visual Fields are full. Pupils are equal, round, and reactive to light.  Discs are difficult to visualize. III,IV, VI: EOMI  diploplia. She has a left ptosis. V: Facial sensation is diminished on left to touch VII: Facial movement is unusual, she is a diminished nasolabial fold on the right, but does not move her left face as much when attempting to smile. She has a ptosis on the left. She leaks air when she attempts to puff out her cheeks from the left side. VIII: hearing is intact to voice X: Uvula elevates symmetrically XI: Shoulder shrug is symmetric. XII: tongue is midline without atrophy or fasciculations.  Motor: Tone is normal. Bulk is normal. 5/5 strength was present on the right side. She is 4-/5 in the left proximal upper extremity, 4/5 distal left upper extremity. She is 3/5 throughout her left lower extremity. She has a marked drift on the left Sensory: Sensation is diminished to touch throughout the left side Deep Tendon Reflexes: 2+ and symmetric in brachoradialis. Unable to elicit quadriceps or ankles, though this could be due to habitus/difficulty relaxing.  Cerebellar: FNF  intact on right,  unable to perform on left due to weakness Gait: Unable to perform due to weakness  I have reviewed labs in epic and the results pertinent to this consultation are: CMP-relatively unremarkable, very mildly elevated creatinine. CBC - leukocytosis  I have reviewed the images obtained:CT head - no acute fidings  Impression: 42 yo F with new left sided weakness starting on awakening from 3 hour long nap. She was not a tPA candidate. I do suspect a new infarct and given her history of lupus and diabetes she is at risk for this. She is being admitted for stroke workup. She is too  large for MRI, but would still need to evaluate her vasculature with CTA.   Recommendations: 1. HgbA1c, fasting lipid panel 2. CTA head and neck, can be performed tomorrow am to give time for evolution of CVA to possibly be seen on CT.(weight limit to try MRI is 350 Lbs)   3. Frequent neuro checks 4. Echocardiogram 5. Carotid dopplers not needed as will have CTA 6. Prophylactic therapy-Antiplatelet med: Aspirin - dose 325mg  7. Risk factor modification 8. Telemetry monitoring 9. PT consult, OT consult, Speech consult 10. Would send hypercoagulable panel given her young age and risk given lupus.    Ritta Slot, MD [facility] [phone]  If 7pm- 7am, please page neurology on call at [phone].

## 2012-10-09 NOTE — ED Notes (Signed)
rn accompanied pt to CT

## 2012-10-09 NOTE — ED Notes (Signed)
[facility] here to tansfer pt to [facility].

## 2012-10-10 ENCOUNTER — Inpatient Hospital Stay (HOSPITAL_COMMUNITY): Payer: Medicaid Other

## 2012-10-10 DIAGNOSIS — I6350 Cerebral infarction due to unspecified occlusion or stenosis of unspecified cerebral artery: Principal | ICD-10-CM

## 2012-10-10 DIAGNOSIS — I6789 Other cerebrovascular disease: Secondary | ICD-10-CM

## 2012-10-10 DIAGNOSIS — G510 Bell's palsy: Secondary | ICD-10-CM

## 2012-10-10 LAB — CBC
HCT: 32.4 % — ABNORMAL LOW (ref 36.0–46.0)
MCH: 27.4 pg (ref 26.0–34.0)
MCHC: 32.7 g/dL (ref 30.0–36.0)
MCV: 83.7 fL (ref 78.0–100.0)
RDW: 14.3 % (ref 11.5–15.5)

## 2012-10-10 LAB — LIPID PANEL
Cholesterol: 165 mg/dL (ref 0–200)
HDL: 73 mg/dL
LDL Cholesterol: 61 mg/dL (ref 0–99)
Total CHOL/HDL Ratio: 2.3 ratio
Triglycerides: 155 mg/dL — ABNORMAL HIGH
VLDL: 31 mg/dL (ref 0–40)

## 2012-10-10 LAB — HEMOGLOBIN A1C
Hgb A1c MFr Bld: 6.9 % — ABNORMAL HIGH (ref <5.7)
Mean Plasma Glucose: 151 mg/dL — ABNORMAL HIGH (ref <117)

## 2012-10-10 LAB — TROPONIN I: Troponin I: <0.3 ng/mL

## 2012-10-10 LAB — GLUCOSE, CAPILLARY
Glucose-Capillary: 122 mg/dL — ABNORMAL HIGH (ref 70–99)
Glucose-Capillary: 115 mg/dL — ABNORMAL HIGH (ref 70–99)

## 2012-10-10 MED ORDER — CLOPIDOGREL BISULFATE 75 MG PO TABS
75.0000 mg | ORAL_TABLET | Freq: Every day | ORAL | Status: DC
  Administered 2012-10-11 – 2012-10-12 (×2): 75 mg via ORAL
  Filled 2012-10-10 (×3): qty 1

## 2012-10-10 MED ORDER — TORSEMIDE 10 MG PO TABS
10.0000 mg | ORAL_TABLET | Freq: Once | ORAL | Status: AC
  Administered 2012-10-11: 10 mg via ORAL
  Filled 2012-10-10: qty 1

## 2012-10-10 MED ORDER — ONDANSETRON HCL 4 MG/2ML IJ SOLN
4.0000 mg | INTRAMUSCULAR | Status: AC
  Administered 2012-10-10: 4 mg via INTRAVENOUS

## 2012-10-10 MED ORDER — MORPHINE SULFATE 2 MG/ML IJ SOLN
INTRAMUSCULAR | Status: AC
  Filled 2012-10-10: qty 1

## 2012-10-10 MED ORDER — MORPHINE SULFATE 2 MG/ML IJ SOLN
2.0000 mg | Freq: Once | INTRAMUSCULAR | Status: AC
  Administered 2012-10-10: 2 mg via INTRAVENOUS

## 2012-10-10 NOTE — Progress Notes (Signed)
Stroke Team Progress Note  HISTORY [name] is a 42 y.o. female with a history of lupus diagnosed in 2003 who went to sleep around noon 10/09/2012 and awoke with left-sided numbness and weakness. The first ER note is at 4:24 PM and given and she was already outside of window, TPA was not considered.  She states that the symptoms started while asleep, and have been persistent since that time. She had a stroke in 2011 with subsequent left-sided weakness, however this is clearly much worse than typical for her. Patient was not a TPA candidate secondary to delay in arrival. She was admitted  for further evaluation and treatment.  SUBJECTIVE No family  is at the bedside.  Overall she feels her condition is stable.   OBJECTIVE Most recent Vital Signs: Filed Vitals:   10/10/12 0115 10/10/12 0327 10/10/12 0526 10/10/12 0947  BP: 115/40 150/63 128/44 138/79  Pulse: 87 91 94 91  Temp: 99.5 F (37.5 C) 98.7 F (37.1 C) 98.3 F (36.8 C) 98.8 F (37.1 C)  TempSrc: Oral Oral Oral Oral  Resp: 20 20 20 18   Height:      Weight:      SpO2: 100% 99% 99% 100%   CBG (last 3)   Recent Labs  10/09/12 2007 10/10/12 0038 10/10/12 0620  GLUCAP 109* 99 122*    IV Fluid Intake:   . sodium chloride 75 mL/hr at 10/10/12 0607    MEDICATIONS  . aspirin  300 mg Rectal Daily   Or  . aspirin  325 mg Oral Daily  . cycloSPORINE  1 drop Both Eyes TID  . enoxaparin (LOVENOX) injection  40 mg Subcutaneous Q24H  . insulin aspart  0-20 Units Subcutaneous Q6H   PRN:  albuterol, ondansetron (ZOFRAN) IV  Diet:  NPO  Activity:  OOB with assistance DVT Prophylaxis:  Lovenox 40 mg sq daily   CLINICALLY SIGNIFICANT STUDIES Basic Metabolic Panel:  Recent Labs Lab 10/09/12 1704 10/09/12 2202  NA 138  --   K 3.7  --   CL 99  --   CO2 26  --   GLUCOSE 87  --   BUN 11  --   CREATININE 1.12* 1.20*  CALCIUM 9.6  --    Liver Function Tests:  Recent Labs Lab 10/09/12 1704  AST 15  ALT 12  ALKPHOS 78   BILITOT 0.3  PROT 7.5  ALBUMIN 2.9*   CBC:  Recent Labs Lab 10/09/12 1704 10/09/12 2202  WBC 15.6* 13.2*  NEUTROABS 11.3*  --   HGB 10.8* 10.4*  HCT 33.5* 31.3*  MCV 84.2 83.5  PLT 349 329   Coagulation:  Recent Labs Lab 10/09/12 1704  LABPROT 12.8  INR 0.97   Cardiac Enzymes:  Recent Labs Lab 10/09/12 2146 10/10/12 0640  TROPONINI <0.30 <0.30   Urinalysis: No results found for this basename: COLORURINE, APPERANCEUR, LABSPEC, PHURINE, GLUCOSEU, HGBUR, BILIRUBINUR, KETONESUR, PROTEINUR, UROBILINOGEN, NITRITE, LEUKOCYTESUR,  in the last 168 hours Lipid Panel    Component Value Date/Time   CHOL 165 10/10/2012 0640   TRIG 155* 10/10/2012 0640   HDL 73 10/10/2012 0640   CHOLHDL 2.3 10/10/2012 0640   VLDL 31 10/10/2012 0640   LDLCALC 61 10/10/2012 0640   HgbA1C  Lab Results  Component Value Date   HGBA1C 7.6* 08/10/2012    Urine Drug Screen:   No results found for this basename: labopia, cocainscrnur, labbenz, amphetmu, thcu, labbarb    Alcohol Level: No results found for this basename: ETH,  in the last 168 hours  CT of the brain  10/09/2012   No acute intracranial abnormalities.  MRI of the brain    MRA of the brain    2D Echocardiogram    Carotid Doppler    CXR  10/09/2012   Enlargement of cardiac silhouette with pulmonary vascular congestion. No acute abnormalities.   EKG  normal sinus rhythm.   Therapy Recommendations   Physical Exam   Morbidly obese african american lady not in distress.Awake alert. Afebrile. Head is nontraumatic. Neck is supple without bruit. Hearing is normal. Cardiac exam no murmur or gallop. Lungs are clear to auscultation. Distal pulses are not well felt. Neurological Exam ; awake and alert oriented x3 with normal speech and language function. Follows commands well. Extraocular moments are full range without nystagmus. Fundi were not visualized. Vision acuity and fields appear normal. There is old left Bell's palsy with facial  moderate left facial weakness greater in the lower half than the upper half and synkinetic left facial involuntary movements.. Tongue is midline. Motor system exam reveals no upper or lower extended drift but mild 4/5 weakness on the left. Left grip is weak compared to the right. Diminished fine finger movements on the left. Orbits right over left approximately. Mild left hip flexor and ankle dorsiflexor weakness. Decreased subjective sensation on the left hemibody. Coordination is slow but accurate on the left. Gait was not tested. Plantars are downgoing. Deep tendon flexes are depressed bilaterally. ASSESSMENT [name] is a 42 y.o. female presenting with worsening left-sided numbness and weakness. Right brain subcortical stroke suspected, Imaging pending. On no antiplatelets prior to admission. Now on aspirin 325 mg orally every day for secondary stroke prevention. Patient with resultant left hemisensory deficit and left hemiparesis. Work up underway.   Morbid obesity, Body mass index is 62.03 kg/(m^2).  Hx bell's palsy, 6-7 yrs ago per pt  Sjogren's syndrome  SLE Diabetes, HgbA1c pending Hypertension Hyperlipidemia, LDL 61, on statin PTA, on statin now, goal LDL < 100 OSA on CPAP Hx stroke with resultant left hemiparesis  Hospital day # 1  TREATMENT/PLAN  Change to clopidogrel 75 mg orally every day for secondary stroke prevention.  F/u 2D  F/u CTA head and neck  Annie Main, MSN, RN, ANVP-BC, ANP-BC, GNP-BC [facility] Pager: [phone] 10/10/2012 11:06 AM  I have personally obtained a history, examined the patient, evaluated imaging results, and formulated the assessment and plan of care. I agree with the above. Delia Heady, MD

## 2012-10-10 NOTE — Progress Notes (Signed)
Pt was concerned that her home meds had not been started, D Ardyth Harps was paged earlier and made aware and she said she will review pt's meds.  Again, this evening pt was complaining of generalized pain 10/10, pt has no prn pain med, Dr. Ardyth Harps was paged again through [email or web address], still awaiting a call back, will pass it off to the on -coming rn to follow up on it-------Peace Dormon, RN

## 2012-10-10 NOTE — Evaluation (Signed)
Speech Language Pathology Evaluation Patient Details Name: [name] MRN: [id number] DOB: 1971/01/18 Today's Date: 10/10/2012 Time: 4098-1191 SLP Time Calculation (min): 15 min  Problem List:  Patient Active Problem List  Diagnosis  . IDDM  . Bell's palsy  . SLE  . SJOGREN'S SYNDROME  . Shortness of breath  . Chest pain  . Leukocytosis  . Anemia  . DM (diabetes mellitus)  . HTN (hypertension)  . Systolic CHF, acute on chronic  . Community acquired pneumonia  . Chronic diastolic heart failure  . CVA (cerebral infarction)  . Chronic respiratory failure   Past Medical History:  Past Medical History  Diagnosis Date  . IDDM 08/09/2010  . Bell's palsy 08/09/2010  . SLE 08/09/2010  . SJOGREN'S SYNDROME 08/09/2010  . Hypertension   . High cholesterol   . CHF (congestive heart failure)   . Anginal pain   . Shortness of breath     "all the time"  . Obstructive sleep apnea on CPAP 2011  . Migraines   . Stroke 2010    "left side is still weak from it; never fully regained full strength"  . Anxiety    Past Surgical History:  Past Surgical History  Procedure Laterality Date  . Ectopic pregnancy surgery  1999  . Cardiac catheterization  02/07/12  . Hernia repair  1980's    umbilical   HPI:  42 yo female adm to [facility] with left sided weakness/sensation impairment.  CT head negative for acute change.  PMH includes but not limited to Sjogren's, CVA (2010), Lupus, Bell's Palsy (2001), pna 10/2011- per pt.  Speech evaluation ordered.    Assessment / Plan / Recommendation Clinical Impression  Pt presents with moderately severe suspected flaccid dysarthria with trigeminal, facial and hypoglossal nerve involvement suspected.  Pt's h/o Bell's palsy and previous stroke make it difficult to delineate baseline deficits.  Lingual deviation to right upon protrusion noted - although pt presented with left sided weakness per her report and chart review.  Pt's known Sjogren's disease resulting in  xerostomia further impair her articulation abilities.  Pt used compensations of slowing rate with min assist and spelling words not understood independently today.    SLP offered communication board for pt to use if desired, but she declined.    Cognitive linguistic skills appear intact for current environment- but pt may have higher level deficits and further cogling diagnostic tx recommended.  SLP educated pt to compensations to further improve speech intelligiblity using teach back to assure comprehension.  SLP to follow to aid in dysarthria mitigation.      SLP Assessment  Patient needs continued Speech Lanaguage Pathology Services    Follow Up Recommendations  Inpatient Rehab    Frequency and Duration min 2x/week  2 weeks   Pertinent Vitals/Pain Afebrile, decreased   SLP Goals  SLP Goals Potential to Achieve Goals: Good Potential Considerations: Previous level of function;Severity of impairments SLP Goal #1: Pt will utilize compensatory strategies to improve speech intelligibility to 75% at phrase level with min verbal cues.  SLP Goal #2: Pt will comprehend paragraph level material indicated by correctly answering questions from material read with mod I.   SLP Goal #3: Pt will write paragraph level information with mod I.   SLP Evaluation Prior Functioning  Cognitive/Linguistic Baseline: Within functional limits Type of Home: House Lives With: Alone Available Help at Discharge:  (mother & brother in town from [facility], neither work, ? able to A) Vocation: On disability   Cognition  Arousal/Alertness: Awake/alert Orientation Level: Oriented X4 Attention: Sustained Sustained Attention: Appears intact Memory: Appears intact Awareness: Appears intact Problem Solving: Appears intact Safety/Judgment: Appears intact    Comprehension  Auditory Comprehension Overall Auditory Comprehension: Appears within functional limits for tasks assessed Yes/No Questions: Not tested Commands:  Within Functional Limits (3 step commands) Conversation: Complex Reading Comprehension Reading Status:  (pt read calendar without problem)    Expression Expression Primary Mode of Expression: Verbal Verbal Expression Overall Verbal Expression: Appears within functional limits for tasks assessed Initiation: No impairment Repetition: No impairment Pragmatics: No impairment Written Expression Dominant Hand: Right Written Expression: Not tested   Oral / Motor Oral Motor/Sensory Function Labial ROM: Reduced right;Reduced left Labial Strength: Reduced Lingual ROM: Reduced right;Reduced left Lingual Strength: Reduced Facial ROM: Reduced right;Reduced left Facial Symmetry: Left drooping eyelid;Left droop;Right droop Facial Strength: Reduced Facial Sensation: Reduced Velum: Within Functional Limits Mandible: Impaired Motor Speech Overall Motor Speech: Impaired Respiration: Impaired (? baseline mild impaired respiratory strength) Phonation: Normal Resonance: Within functional limits Articulation: Impaired Level of Impairment: Word Intelligibility: Intelligibility reduced Word: 50-74% accurate Phrase: 25-49% accurate Sentence: 25-49% accurate Motor Planning: Witnin functional limits Motor Speech Errors: Aware;Consistent Interfering Components: Premorbid status Effective Techniques: Over-articulate;Slow rate (pt spelled street name to slp independently to compensate)   GO     Donavan Burnet, MS [facility] SLP [phone]

## 2012-10-10 NOTE — Progress Notes (Addendum)
[facility]             Progress Note   Subjective: Feels fatigued.  Objective: Vital signs in last 24 hours: Temp:  [98 F (36.7 C)-99.5 F (37.5 C)] 98.8 F (37.1 C) (03/12 0947) Pulse Rate:  [62-94] 91 (03/12 0947) Resp:  [17-24] 18 (03/12 0947) BP: (91-154)/(34-79) 138/79 mmHg (03/12 0947) SpO2:  [93 %-100 %] 100 % (03/12 0947) Weight:  [185 kg (407 lb 13.6 oz)] 185 kg (407 lb 13.6 oz) (03/11 2059) Weight change:  Last BM Date: 10/09/12  Intake/Output from previous day: 03/11 0701 - 03/12 0700 In: 571.3 [I.V.:571.3] Out: -      Physical Exam: General: Alert, awake, oriented x3, in no acute distress. HEENT: No bruits, no goiter, left ptosis. Heart: Regular rate and rhythm, without murmurs, rubs, gallops. Lungs: Clear to auscultation bilaterally. Abdomen: Soft, nontender, nondistended, positive bowel sounds. Extremities: No clubbing cyanosis or edema with positive pedal pulses. Neuro: Left facial droop, left ptosis, dysarthria.    Lab Results: Basic Metabolic Panel:  Recent Labs  91/47/82 1704 10/09/12 2202  NA 138  --   K 3.7  --   CL 99  --   CO2 26  --   GLUCOSE 87  --   BUN 11  --   CREATININE 1.12* 1.20*  CALCIUM 9.6  --    Liver Function Tests:  Recent Labs  10/09/12 1704  AST 15  ALT 12  ALKPHOS 78  BILITOT 0.3  PROT 7.5  ALBUMIN 2.9*   CBC:  Recent Labs  10/09/12 1704 10/09/12 2202  WBC 15.6* 13.2*  NEUTROABS 11.3*  --   HGB 10.8* 10.4*  HCT 33.5* 31.3*  MCV 84.2 83.5  PLT 349 329   Cardiac Enzymes:  Recent Labs  10/09/12 2146 10/10/12 0640  TROPONINI <0.30 <0.30   BNP:  Recent Labs  10/09/12 1704  PROBNP 73.4   CBG:  Recent Labs  10/09/12 1720 10/09/12 2007 10/10/12 0038 10/10/12 0620 10/10/12 1407  GLUCAP 94 109* 99 122* 115*   Hemoglobin A1C: No results found for this basename: HGBA1C,  in the last 72 hours Fasting Lipid Panel:  Recent Labs  10/10/12 0640  CHOL 165  HDL 73   LDLCALC 61  TRIG 956*  CHOLHDL 2.3   Coagulation:  Recent Labs  10/09/12 1704  LABPROT 12.8  INR 0.97    Studies/Results: Dg Chest 2 View  10/09/2012  *RADIOLOGY REPORT*  Clinical Data: Code stroke, left facial droop and left side weakness  CHEST - 2 VIEW  Comparison: 07/30/2012  Findings: Enlargement of cardiac silhouette with pulmonary vascular congestion. Mediastinal contours stable. Lungs clear. No pleural effusion or pneumothorax. Lateral view limited by underpenetration due to body habitus.  IMPRESSION: Enlargement of cardiac silhouette with pulmonary vascular congestion. No acute abnormalities.   Original Report Authenticated By: Ulyses Southward, M.D.    Ct Head (brain) Wo Contrast  10/09/2012  *RADIOLOGY REPORT*  Clinical Data: Code stroke, left side weakness, facial droop, onset of symptoms at 1500 hours  CT HEAD WITHOUT CONTRAST  Technique:  Contiguous axial images were obtained from the base of the skull through the vertex without contrast.  Comparison: 11/10/2008  Findings: Normal ventricular morphology. No midline shift or mass effect. Normal appearance of brain parenchyma. No definite intracranial hemorrhage, mass lesion or evidence of acute infarction. No extra-axial fluid collection. Mucosal retention cysts in the maxillary sinuses with additional partial opacification of bilateral ethmoid air cells and right maxillary sinus. Bones  unremarkable.  IMPRESSION: No acute intracranial abnormalities.  Critical Value/emergent results were called by telephone at the time of interpretation on 10/09/2012 at 1720 hours to Dr. Freida Busman, who verbally acknowledged these results.   Original Report Authenticated By: Ulyses Southward, M.D.     Medications: Scheduled Meds: . aspirin  300 mg Rectal Daily   Or  . aspirin  325 mg Oral Daily  . cycloSPORINE  1 drop Both Eyes TID  . enoxaparin (LOVENOX) injection  40 mg Subcutaneous Q24H  . insulin aspart  0-20 Units Subcutaneous Q6H   Continuous  Infusions: . sodium chloride 75 mL/hr at 10/10/12 0607   PRN Meds:.albuterol, ondansetron (ZOFRAN) IV  Assessment/Plan:  Principal Problem:   CVA (cerebral infarction) Active Problems:   SJOGREN'S SYNDROME   Chest pain   Leukocytosis   Anemia   DM (diabetes mellitus)   Chronic diastolic heart failure   Chronic respiratory failure   Morbid obesity   Suspected Right Brain CVA -Unable to fit in MRI/CTA given weight. -ECHO ok. -Dopplers pending. -PT/OT evals pending. -Has severe dysarthria and difficulty swallowing. St recommending D-I diet with thin liquids. Also ST recs CIR. -Will request CIR eval, if not will likely need SNF.  DM -Well controlled.  Time spent coordinating care: 35 minutes.   LOS: 1 day   [facility] Pager: [phone] 10/10/2012, 2:11 PM

## 2012-10-10 NOTE — Progress Notes (Signed)
Shift event: RN paged NP 2/2 pt c/o chest pain. Pt admitted tonight for left facial weakness and stroke w/up which so far, has been neg. In the ED, she c/o chest pain and the EKG showed no acute changes. She has had 2 neg troponins so far. [facility] admitting doc wrote this chest pain as atypical and chronic. Pt had neg card cath recently. EKG was redone and also found without acute changes. Pt given dose of Morphine and pain dissipated. Is already on ASA for stroke issues. Will cont to monitor.  Jimmye Norman, NP [facility]

## 2012-10-10 NOTE — Progress Notes (Signed)
Physical Therapy Evaluation Note  Past Medical History  Diagnosis Date  . IDDM 08/09/2010  . Bell's palsy 08/09/2010  . SLE 08/09/2010  . SJOGREN'S SYNDROME 08/09/2010  . Hypertension   . High cholesterol   . CHF (congestive heart failure)   . Anginal pain   . Shortness of breath     "all the time"  . Obstructive sleep apnea on CPAP 2011  . Migraines   . Stroke 2010    "left side is still weak from it; never fully regained full strength"  . Anxiety     Past Surgical History  Procedure Laterality Date  . Ectopic pregnancy surgery  1999  . Cardiac catheterization  02/07/12  . Hernia repair  1980's    umbilical     10/10/12 1043  PT Visit Information  Last PT Received On 10/10/12  Assistance Needed +1  PT Time Calculation  PT Start Time 1043  PT Stop Time 1101  PT Time Calculation (min) 18 min  Subjective Data  Subjective Pt received sitting up in chair. Transportation here to take pt down for procedure  Precautions  Precautions Fall  Restrictions  Weight Bearing Restrictions No  Home Living  Lives With Alone  Available Help at Discharge Family;Available 24 hours/day (brother and mother in from [facility] visiting, brother can assist)  Type of Home House  Home Access Stairs to enter  Entrance Stairs-Number of Steps 3  Entrance Stairs-Rails Right  Home Layout One level  Bathroom Nurse, children's Yes  How Accessible Accessible via walker  Home Adaptive Equipment Bedside commode/3-in-1;Shower chair with back;Walker - rolling  Prior Function  Level of Independence Independent  Able to Take Stairs? Yes  Driving Yes  Vocation On disability  Communication  Communication No difficulties  Cognition  Overall Cognitive Status Appears within functional limits for tasks assessed/performed  Arousal/Alertness Awake/alert  Orientation Level Oriented X4 / Intact  Behavior During Session [facility] for tasks performed  Right Upper  Extremity Assessment  RUE ROM/Strength/Tone WFL  RUE Sensation WFL - Light Touch  RUE Coordination WFL - gross/fine motor  Left Upper Extremity Assessment  LUE ROM/Strength/Tone Deficits  LUE ROM/Strength/Tone Deficits grossly 2+/5  Right Lower Extremity Assessment  RLE ROM/Strength/Tone WFL for tasks assessed  Left Lower Extremity Assessment  LLE ROM/Strength/Tone Deficits  LLE ROM/Strength/Tone Deficits grossly 3+/5 except DF 3-/5  LLE Sensation Deficits  LLE Sensation Deficits numbness  Trunk Assessment  Trunk Assessment Normal  Bed Mobility  Bed Mobility Sit to Supine  Sit to Supine 6: Modified independent (Device/Increase time);With rail;HOB flat  Details for Bed Mobility Assistance increased time, labored effort - due mostly to obesity  Transfers  Transfers Sit to Stand;Stand to Sit  Sit to Stand With upper extremity assist;With armrests;From chair/3-in-1;4: Min assist  Stand to Sit 4: Min guard;With upper extremity assist;To bed  Details for Transfer Assistance increased time to achieve full upright standing, wide base of support  Ambulation/Gait  Ambulation/Gait Assistance 4: Min assist  Ambulation Distance (Feet) 10 Feet  Assistive device 1 person hand held assist (pt pushed IV pole)  Ambulation/Gait Assistance Details pt on 2LO2 via [place] - + SOB, pt reports this to be normal.  Gait Pattern Step-to pattern;Decreased step length - left;Decreased stance time - left;Decreased step length - right;Decreased stride length;Wide base of support  Gait velocity slow  General Gait Details pt unsteady but no obvious LOB  Stairs No  Modified Rankin (Stroke Patients Only)  Pre-Morbid  Rankin Score 2  Modified Rankin 3  Balance  Balance Assessed Yes  Static Sitting Balance  Static Sitting - Balance Support No upper extremity supported;Feet supported  Static Sitting - Level of Assistance 5: Stand by assistance  Static Sitting - Comment/# of Minutes 2 min  PT - End of Session   Activity Tolerance Patient limited by fatigue  Patient left in bed;with call bell/phone within reach (pt leaving for procedure)  Nurse Communication Mobility status  PT Assessment  Clinical Impression Statement Pt is a 42 yo admittef for onset of L UE/LE weakness with h;/o lupus and previous stroke. Pt functioning near baseline but does have increased L UE/LE weakness and impaired balance requiring assist for safe transfers at this time. Pt safe to go home with 24/7 supervision only and HHPT however will need SNF placement if 24/7 supervision can not be provided to address mentioned deficits and to achieve mod I function for safe transition home.  PT Recommendation/Assessment Patient needs continued PT services  PT Problem List Decreased strength;Decreased activity tolerance;Decreased balance;Decreased mobility  Barriers to Discharge Decreased caregiver support  Barriers to Discharge Comments pt lives alone but has family visiting in which she reports can help  PT Therapy Diagnosis  Difficulty walking;Generalized weakness  PT Plan  PT Frequency Min 4X/week  PT Treatment/Interventions Gait training;Stair training;Functional mobility training;Therapeutic activities;Therapeutic exercise;Balance training;Neuromuscular re-education  PT Recommendation  Follow Up Recommendations Home health PT;Supervision/Assistance - 24 hour (SNF placement if 24/7 supervision can not be provided)  PT equipment (TBD)  Individuals Consulted  Consulted and Agree with Results and Recommendations Patient  Acute Rehab PT Goals  PT Goal Formulation With patient  Time For Goal Achievement 10/17/12  Potential to Achieve Goals Good  Pt will go Supine/Side to Sit with modified independence;with HOB 0 degrees  PT Goal: Supine/Side to Sit - Progress Goal set today  Pt will go Sit to Stand with modified independence;with upper extremity assist  PT Goal: Sit to Stand - Progress Goal set today  Pt will Stand with modified  independence;1 - 2 min;with no upper extremity support (for AM ADLs)  PT Goal: Stand - Progress Goal set today  Pt will Ambulate 16 - 50 feet;with modified independence (no device)  PT Goal: Ambulate - Progress Goal set today  Pt will Go Up / Down Stairs 3-5 stairs;with supervision;with rail(s)  PT Goal: Up/Down Stairs - Progress Goal set today  PT General Charges  $$ ACUTE PT VISIT 1 Procedure  PT Evaluation  $Initial PT Evaluation Tier I 1 Procedure  PT Treatments  $Gait Training 8-22 mins  Written Expression  Dominant Hand Right    Lewis Shock, PT, DPT Pager #: [phone] Office #: [phone]

## 2012-10-10 NOTE — Progress Notes (Signed)
Pt complaining of chest pain rated 9/10. Pt described pain as pressure and stated that pain radiated to the left side of her back. Md paged and order was given to repeat EKG and give morphine for pain.  Orders were carried out. Md notified of results of EKG.  Pt's pain was slightly relieved by morphine. RN will continue to monitor.

## 2012-10-10 NOTE — Evaluation (Signed)
Clinical/Bedside Swallow Evaluation Patient Details  Name: [name] MRN: [id number] Date of Birth: Jan 18, 1971  Today's Date: 10/10/2012 Time: 0829-0903 SLP Time Calculation (min): 34 min  Past Medical History:  Past Medical History  Diagnosis Date  . IDDM 08/09/2010  . Bell's palsy 08/09/2010  . SLE 08/09/2010  . SJOGREN'S SYNDROME 08/09/2010  . Hypertension   . High cholesterol   . CHF (congestive heart failure)   . Anginal pain   . Shortness of breath     "all the time"  . Obstructive sleep apnea on CPAP 2011  . Migraines   . Stroke 2010    "left side is still weak from it; never fully regained full strength"  . Anxiety    Past Surgical History:  Past Surgical History  Procedure Laterality Date  . Ectopic pregnancy surgery  1999  . Cardiac catheterization  02/07/12  . Hernia repair  1980's    umbilical   HPI:  42 yo female adm to [facility] with left sided weakness/sensation impairment.  CT head negative for acute change.  PMH includes but not limited to Sjogren's, CVA (2010), Lupus, Bell's Palsy (2001), pna 10/2011- per pt.  Pt did not pass a RN stroke swallow screen therefore BSE ordered.     Assessment / Plan / Recommendation Clinical Impression  Pt presents with clinical indications of moderately severe oral dysphagia with trigeminal, facial and hypoglossal nerve involvement suspected.  Pt's h/o Bell's palsy and previous stroke make it difficult to delineate baseline deficits.  Lingual deviation to right upon protrusion noted - although pt presented with left sided weakness per her report and chart review.  Pt has to manually open her mouth and independently places food/straw on right side of mouth to compensate for her deficits.  She is currently unable to masticate foods due to level of dysphagia.  Suspect pharyngeal swallow is intact as pt has strong cough/voice and bilateral palatal elevation.    Pt's known Sjogren's disease, xerostomia and large pill dysphagia x6 months (sensation  of pills lodging in pharynx requiring liquids to clear) further impairs swallowing.     Rec initiate a puree/thin diet with strict aspiration and esophageal precautions.  Fortunately pt appears cognitively intact and is aware of her dysphagia and indication for compensation.  SLP educated pt to aspiration precautions and compensations using teach back to assure comprehension.  SLP to follow to aid in dysphagia mitigation.      Aspiration Risk  Moderate    Diet Recommendation Dysphagia 1 (Puree);Thin liquid   Liquid Administration via: Straw Medication Administration: Crushed with puree Supervision: Patient able to self feed Compensations: Slow rate;Small sips/bites;Check for pocketing (drink liquids throughout meal, start meal with drink) Postural Changes and/or Swallow Maneuvers: Seated upright 90 degrees;Upright 30-60 min after meal    Other  Recommendations Oral Care Recommendations: Oral care BID   Follow Up Recommendations  Inpatient Rehab    Frequency and Duration min 2x/week  2 weeks   Pertinent Vitals/Pain Afebrile, decreased    SLP Swallow Goals Patient will utilize recommended strategies during swallow to increase swallowing safety with: Independent assistance Goal #3: Pt will masticate soft solids and clear orally within 25 seconds to aid in determining readiness for dietary advancement.    Swallow Study Prior Functional Status   At home independent, on disability.     General HPI: 42 yo female adm to [facility] with left sided weakness/sensation impairment.  CT head negative for acute change.  PMH includes but not limited to Sjogren's,  CVA (2010), Lupus, Bell's Palsy (2001), pna 10/2011- per pt.  Pt did not pass a RN stroke swallow screen therefore BSE ordered.   Type of Study: Bedside swallow evaluation Previous Swallow Assessment: none in EPIC Diet Prior to this Study: NPO Temperature Spikes Noted: No Respiratory Status: Room air Behavior/Cognition:  Alert;Cooperative;Pleasant mood Oral Cavity - Dentition: Adequate natural dentition Self-Feeding Abilities: Able to feed self Patient Positioning: Upright in bed Baseline Vocal Quality: Clear Volitional Cough: Strong Volitional Swallow: Able to elicit    Oral/Motor/Sensory Function Labial ROM: Reduced right;Reduced left Labial Strength: Reduced Lingual ROM: Reduced right;Reduced left Lingual Strength: Reduced Facial ROM: Reduced right;Reduced left Facial Symmetry: Left drooping eyelid;Left droop;Right droop Facial Strength: Reduced Facial Sensation: Reduced Velum: Within Functional Limits Mandible: Impaired   Ice Chips Ice chips: Not tested   Thin Liquid Thin Liquid: Impaired Presentation: Self Fed;Straw Oral Phase Impairments: Reduced labial seal;Impaired anterior to posterior transit;Reduced lingual movement/coordination Oral Phase Functional Implications: Right anterior spillage;Prolonged oral transit Pharyngeal  Phase Impairments: Suspected delayed Swallow Other Comments: suspect prolonged oral transit    Nectar Thick Nectar Thick Liquid: Not tested   Honey Thick Honey Thick Liquid: Not tested   Puree Puree: Impaired Presentation: Self Fed;Spoon Oral Phase Impairments: Reduced labial seal;Reduced lingual movement/coordination;Impaired anterior to posterior transit Oral Phase Functional Implications: Prolonged oral transit Pharyngeal Phase Impairments: Suspected delayed Swallow Other Comments: pt compensates by placing bolus on right side of mouth   Solid   GO    Solid: Impaired Presentation: Self Fed Oral Phase Impairments: Reduced labial seal;Reduced lingual movement/coordination;Impaired anterior to posterior transit Oral Phase Functional Implications: Right lateral sulci pocketing Other Comments: pt unable to masticate cracker effectively, expectorated after pt started coughing per SLP verbal cue       Donavan Burnet, MS [facility] SLP [phone]

## 2012-10-11 ENCOUNTER — Inpatient Hospital Stay (HOSPITAL_COMMUNITY): Payer: Medicaid Other

## 2012-10-11 DIAGNOSIS — E119 Type 2 diabetes mellitus without complications: Secondary | ICD-10-CM

## 2012-10-11 LAB — GLUCOSE, CAPILLARY
Glucose-Capillary: 180 mg/dL — ABNORMAL HIGH (ref 70–99)
Glucose-Capillary: 149 mg/dL — ABNORMAL HIGH (ref 70–99)

## 2012-10-11 LAB — HEMOGLOBIN A1C: Hgb A1c MFr Bld: 6.9 % — ABNORMAL HIGH (ref <5.7)

## 2012-10-11 LAB — BETA-2-GLYCOPROTEIN I ABS, IGG/M/A
Beta-2 Glyco I IgG: 2 G Units (ref <20)
Beta-2-Glycoprotein I IgA: 6 A Units (ref <20)
Beta-2-Glycoprotein I IgM: 0 M Units (ref <20)

## 2012-10-11 LAB — BASIC METABOLIC PANEL
BUN: 10 mg/dL (ref 6–23)
Creatinine, Ser: 1.05 mg/dL (ref 0.50–1.10)
GFR calc Af Amer: 75 mL/min — ABNORMAL LOW (ref >90)
GFR calc non Af Amer: 65 mL/min — ABNORMAL LOW (ref >90)

## 2012-10-11 LAB — LUPUS ANTICOAGULANT PANEL
DRVVT: 38.7 secs (ref <42.9)
Lupus Anticoagulant: NOT DETECTED

## 2012-10-11 LAB — CARDIOLIPIN ANTIBODIES, IGG, IGM, IGA
Anticardiolipin IgA: 0 APL U/mL — ABNORMAL LOW (ref <22)
Anticardiolipin IgM: 0 MPL U/mL — ABNORMAL LOW (ref <11)

## 2012-10-11 MED ORDER — AZATHIOPRINE 50 MG PO TABS
50.0000 mg | ORAL_TABLET | Freq: Two times a day (BID) | ORAL | Status: DC
  Administered 2012-10-11 – 2012-10-12 (×2): 50 mg via ORAL
  Filled 2012-10-11 (×4): qty 1

## 2012-10-11 MED ORDER — ASPIRIN EC 81 MG PO TBEC
81.0000 mg | DELAYED_RELEASE_TABLET | Freq: Every day | ORAL | Status: DC
  Administered 2012-10-11 – 2012-10-12 (×2): 81 mg via ORAL
  Filled 2012-10-11 (×2): qty 1

## 2012-10-11 MED ORDER — SENNOSIDES-DOCUSATE SODIUM 8.6-50 MG PO TABS: 2.0000 | ORAL_TABLET | Freq: Every day | ORAL | Status: DC

## 2012-10-11 MED ORDER — FAMOTIDINE 20 MG PO TABS
20.0000 mg | ORAL_TABLET | Freq: Two times a day (BID) | ORAL | Status: DC
  Administered 2012-10-11: 10 mg via ORAL
  Administered 2012-10-12: 20 mg via ORAL
  Filled 2012-10-11 (×4): qty 1

## 2012-10-11 MED ORDER — SIMVASTATIN 5 MG PO TABS
5.0000 mg | ORAL_TABLET | Freq: Every day | ORAL | Status: DC
  Administered 2012-10-11: 5 mg via ORAL
  Filled 2012-10-11 (×2): qty 1

## 2012-10-11 MED ORDER — ISOSORB DINITRATE-HYDRALAZINE 20-37.5 MG PO TABS
1.0000 | ORAL_TABLET | Freq: Three times a day (TID) | ORAL | Status: DC
  Administered 2012-10-11 – 2012-10-12 (×3): 1 via ORAL
  Filled 2012-10-11 (×6): qty 1

## 2012-10-11 MED ORDER — INSULIN ASPART 100 UNIT/ML ~~LOC~~ SOLN
0.0000 [IU] | Freq: Three times a day (TID) | SUBCUTANEOUS | Status: DC
  Administered 2012-10-11 – 2012-10-12 (×3): 2 [IU] via SUBCUTANEOUS

## 2012-10-11 MED ORDER — INSULIN ASPART 100 UNIT/ML ~~LOC~~ SOLN
4.0000 [IU] | Freq: Three times a day (TID) | SUBCUTANEOUS | Status: DC
  Administered 2012-10-11 – 2012-10-12 (×3): 4 [IU] via SUBCUTANEOUS

## 2012-10-11 MED ORDER — POTASSIUM CHLORIDE CRYS ER 20 MEQ PO TBCR
20.0000 meq | EXTENDED_RELEASE_TABLET | Freq: Three times a day (TID) | ORAL | Status: DC
  Administered 2012-10-11 – 2012-10-12 (×3): 20 meq via ORAL
  Filled 2012-10-11 (×6): qty 1

## 2012-10-11 MED ORDER — BUPROPION HCL ER (SR) 150 MG PO TB12
150.0000 mg | ORAL_TABLET | Freq: Two times a day (BID) | ORAL | Status: DC
  Administered 2012-10-11 – 2012-10-12 (×3): 150 mg via ORAL
  Filled 2012-10-11 (×4): qty 1

## 2012-10-11 MED ORDER — LOSARTAN POTASSIUM 50 MG PO TABS
50.0000 mg | ORAL_TABLET | Freq: Every day | ORAL | Status: DC
  Administered 2012-10-11 – 2012-10-12 (×2): 50 mg via ORAL
  Filled 2012-10-11 (×2): qty 1

## 2012-10-11 MED ORDER — TORSEMIDE 10 MG PO TABS
10.0000 mg | ORAL_TABLET | Freq: Three times a day (TID) | ORAL | Status: DC
  Administered 2012-10-11 – 2012-10-12 (×3): 10 mg via ORAL
  Filled 2012-10-11 (×6): qty 1

## 2012-10-11 MED ORDER — ALPRAZOLAM 0.5 MG PO TABS: 0.5000 mg | ORAL_TABLET | Freq: Every day | ORAL | Status: DC

## 2012-10-11 MED ORDER — LORATADINE 10 MG PO TABS
10.0000 mg | ORAL_TABLET | Freq: Every day | ORAL | Status: DC
  Administered 2012-10-11 – 2012-10-12 (×2): 10 mg via ORAL
  Filled 2012-10-11 (×2): qty 1

## 2012-10-11 MED ORDER — GABAPENTIN 300 MG PO CAPS
300.0000 mg | ORAL_CAPSULE | Freq: Two times a day (BID) | ORAL | Status: DC
  Administered 2012-10-11 – 2012-10-12 (×2): 300 mg via ORAL
  Filled 2012-10-11 (×4): qty 1

## 2012-10-11 MED ORDER — CARVEDILOL 3.125 MG PO TABS
3.1250 mg | ORAL_TABLET | Freq: Two times a day (BID) | ORAL | Status: DC
  Administered 2012-10-12: 3.125 mg via ORAL
  Filled 2012-10-11 (×4): qty 1

## 2012-10-11 MED ORDER — HYDROXYCHLOROQUINE SULFATE 200 MG PO TABS
200.0000 mg | ORAL_TABLET | Freq: Two times a day (BID) | ORAL | Status: DC
  Administered 2012-10-11 – 2012-10-12 (×2): 200 mg via ORAL
  Filled 2012-10-11 (×4): qty 1

## 2012-10-11 MED ORDER — TRAZODONE HCL 100 MG PO TABS
100.0000 mg | ORAL_TABLET | Freq: Every day | ORAL | Status: DC
  Filled 2012-10-11 (×2): qty 1

## 2012-10-11 MED ORDER — PROMETHAZINE HCL 25 MG/ML IJ SOLN
25.0000 mg | Freq: Four times a day (QID) | INTRAMUSCULAR | Status: DC | PRN
Start: 2012-10-11 — End: 2012-10-12
  Administered 2012-10-11: 25 mg via INTRAVENOUS
  Filled 2012-10-11 (×2): qty 1

## 2012-10-11 NOTE — Progress Notes (Signed)
[facility]             Progress Note   Subjective: Feels fatigued. Speech issues seem to be improving. She does not like the pureed diet.  Objective: Vital signs in last 24 hours: Temp:  [98.3 F (36.8 C)-98.9 F (37.2 C)] 98.5 F (36.9 C) (03/13 1116) Pulse Rate:  [88-100] 90 (03/13 1116) Resp:  [18-20] 18 (03/13 1116) BP: (135-150)/(71-83) 135/83 mmHg (03/13 1116) SpO2:  [97 %-100 %] 100 % (03/13 1116) Weight change:  Last BM Date: 10/09/12  Intake/Output from previous day:       Physical Exam: General: Alert, awake, oriented x3, in no acute distress. HEENT: No bruits, no goiter, left ptosis. Heart: Regular rate and rhythm, without murmurs, rubs, gallops. Lungs: Clear to auscultation bilaterally. Abdomen: Soft, nontender, nondistended, positive bowel sounds. Extremities: No clubbing cyanosis or edema with positive pedal pulses. Neuro: Left facial droop, left ptosis, dysarthria.    Lab Results: Basic Metabolic Panel:  Recent Labs  40/98/11 1704 10/09/12 2202 10/11/12 0640  NA 138  --  141  K 3.7  --  3.8  CL 99  --  104  CO2 26  --  27  GLUCOSE 87  --  170*  BUN 11  --  10  CREATININE 1.12* 1.20* 1.05  CALCIUM 9.6  --  9.2   Liver Function Tests:  Recent Labs  10/09/12 1704  AST 15  ALT 12  ALKPHOS 78  BILITOT 0.3  PROT 7.5  ALBUMIN 2.9*   CBC:  Recent Labs  10/09/12 1704 10/09/12 2202 10/10/12 2045  WBC 15.6* 13.2* 12.1*  NEUTROABS 11.3*  --   --   HGB 10.8* 10.4* 10.6*  HCT 33.5* 31.3* 32.4*  MCV 84.2 83.5 83.7  PLT 349 329 323   Cardiac Enzymes:  Recent Labs  10/09/12 2146 10/10/12 0640 10/10/12 1411  TROPONINI <0.30 <0.30 <0.30   BNP:  Recent Labs  10/09/12 1704  PROBNP 73.4   CBG:  Recent Labs  10/10/12 0620 10/10/12 1407 10/10/12 1809 10/11/12 10/11/12 0626 10/11/12 1114  GLUCAP 122* 115* 165* 164* 180* 149*   Hemoglobin A1C:  Recent Labs  10/10/12 0640  HGBA1C 6.9*   Fasting Lipid  Panel:  Recent Labs  10/10/12 0640  CHOL 165  HDL 73  LDLCALC 61  TRIG 155*  CHOLHDL 2.3   Coagulation:  Recent Labs  10/09/12 1704  LABPROT 12.8  INR 0.97    Studies/Results: Dg Chest 2 View  10/09/2012  *RADIOLOGY REPORT*  Clinical Data: Code stroke, left facial droop and left side weakness  CHEST - 2 VIEW  Comparison: 07/30/2012  Findings: Enlargement of cardiac silhouette with pulmonary vascular congestion. Mediastinal contours stable. Lungs clear. No pleural effusion or pneumothorax. Lateral view limited by underpenetration due to body habitus.  IMPRESSION: Enlargement of cardiac silhouette with pulmonary vascular congestion. No acute abnormalities.   Original Report Authenticated By: Ulyses Southward, M.D.    Ct Head Wo Contrast  10/11/2012  *RADIOLOGY REPORT*  Clinical Data: Stroke.  Improving speech and left leg symptoms.  CT HEAD WITHOUT CONTRAST  Technique:  Contiguous axial images were obtained from the base of the skull through the vertex without contrast.  Comparison: CT head without contrast 10/09/2012.  Findings: Gray-white differentiation is preserved at the insular cortex and within the basal ganglia.  No acute infarct or hemorrhage is present.  There is no mass lesion.  The ventricles are of normal size.  No significant extra-axial fluid collection is  evident.  Polyps are again noted in the maxillary sinuses.  There is scattered opacification of ethmoid air cells.  The mastoid air cells are clear.  The osseous skull is intact.  IMPRESSION:  1.  Normal CT appearance of the brain. 2.  Sinus disease as described.   Original Report Authenticated By: Marin Roberts, M.D.    Ct Head (brain) Wo Contrast  10/09/2012  *RADIOLOGY REPORT*  Clinical Data: Code stroke, left side weakness, facial droop, onset of symptoms at 1500 hours  CT HEAD WITHOUT CONTRAST  Technique:  Contiguous axial images were obtained from the base of the skull through the vertex without contrast.  Comparison:  11/10/2008  Findings: Normal ventricular morphology. No midline shift or mass effect. Normal appearance of brain parenchyma. No definite intracranial hemorrhage, mass lesion or evidence of acute infarction. No extra-axial fluid collection. Mucosal retention cysts in the maxillary sinuses with additional partial opacification of bilateral ethmoid air cells and right maxillary sinus. Bones unremarkable.  IMPRESSION: No acute intracranial abnormalities.  Critical Value/emergent results were called by telephone at the time of interpretation on 10/09/2012 at 1720 hours to Dr. Freida Busman, who verbally acknowledged these results.   Original Report Authenticated By: Ulyses Southward, M.D.     Medications: Scheduled Meds: . ALPRAZolam  0.5 mg Oral QHS  . aspirin EC  81 mg Oral Daily  . azaTHIOprine  50 mg Oral BID  . buPROPion  150 mg Oral BID  . carvedilol  3.125 mg Oral BID WC  . clopidogrel  75 mg Oral Q breakfast  . cycloSPORINE  1 drop Both Eyes TID  . enoxaparin (LOVENOX) injection  40 mg Subcutaneous Q24H  . famotidine  20 mg Oral BID  . gabapentin  300 mg Oral BID  . hydroxychloroquine  200 mg Oral BID  . insulin aspart  0-15 Units Subcutaneous TID WC  . insulin aspart  4 Units Subcutaneous TID WC  . isosorbide-hydrALAZINE  1 tablet Oral TID  . loratadine  10 mg Oral Daily  . losartan  50 mg Oral Daily  . potassium chloride SA  20 mEq Oral TID  . simvastatin  5 mg Oral q1800  . torsemide  10 mg Oral TID  . traZODone  100 mg Oral QHS   Continuous Infusions: . sodium chloride 75 mL/hr at 10/10/12 1942   PRN Meds:.albuterol, ondansetron (ZOFRAN) IV  Assessment/Plan:  Principal Problem:   CVA (cerebral infarction) Active Problems:   SJOGREN'S SYNDROME   Chest pain   Leukocytosis   Anemia   DM (diabetes mellitus)   Chronic diastolic heart failure   Chronic respiratory failure   Morbid obesity   Suspected Right Brain CVA -Unable to fit in MRI/CTA given weight. -ECHO/doppler ok. -PT  eval pending but per OT can do HH. -Has severe dysarthria and difficulty swallowing. St recommending D-I diet with thin liquids.  -CIR not willing to consider as she currently does not have a PT need. -On plavix for secondary stroke prevention. -Repeat CT without evidence for CVA but left sided-weakness, dysphagia and dysarthria persist.  DM -Well controlled.  Time spent coordinating care: 35 minutes.   LOS: 2 days   [facility] Pager: [phone] 10/11/2012, 2:33 PM

## 2012-10-11 NOTE — Progress Notes (Signed)
*  PRELIMINARY RESULTS* Vascular Ultrasound Carotid Duplex (Doppler) has been completed.  Preliminary findings: No hemodynamically significant ICA stenosis bilaterally.  Cathie Beams 10/11/2012, 12:39 PM

## 2012-10-11 NOTE — Progress Notes (Signed)
Thank you for consult on [name]. Notes reviewed. PT evaluation limited by fatigue. Anticipate patient to improve over next 24-48 hours. HHPT recommended for follow up at this time. Will follow up tomorrow to monitor for progress.

## 2012-10-11 NOTE — Progress Notes (Signed)
Stroke Team Progress Note  HISTORY [name] is a 42 y.o. female with a history of lupus diagnosed in 2003 who went to sleep around noon 10/09/2012 and awoke with left-sided numbness and weakness. The first ER note is at 4:24 PM and given and she was already outside of window, TPA was not considered.  She states that the symptoms started while asleep, and have been persistent since that time. She had a stroke in 2011 with subsequent left-sided weakness, however this is clearly much worse than typical for her. Patient was not a TPA candidate secondary to delay in arrival. She was admitted  for further evaluation and treatment.  SUBJECTIVE Female at bedside. States she plans to take care of patient at home.  OBJECTIVE Most recent Vital Signs: Filed Vitals:   10/10/12 2100 10/11/12 0210 10/11/12 0330 10/11/12 0521  BP: 141/81 150/73  148/73  Pulse: 100 92 95 88  Temp: 98.9 F (37.2 C) 98.3 F (36.8 C)  98.3 F (36.8 C)  TempSrc: Oral Oral  Oral  Resp: 18 18 18 20   Height:      Weight:      SpO2: 97% 100% 97% 100%   CBG (last 3)   Recent Labs  10/10/12 1809 10/11/12 10/11/12 0626  GLUCAP 165* 164* 180*    IV Fluid Intake:   . sodium chloride 75 mL/hr at 10/10/12 1942    MEDICATIONS  . clopidogrel  75 mg Oral Q breakfast  . cycloSPORINE  1 drop Both Eyes TID  . enoxaparin (LOVENOX) injection  40 mg Subcutaneous Q24H  . insulin aspart  0-20 Units Subcutaneous Q6H   PRN:  albuterol, ondansetron (ZOFRAN) IV  Diet:  Dysphagia 1 thin liquids Activity:  OOB with assistance DVT Prophylaxis:  Lovenox 40 mg sq daily   CLINICALLY SIGNIFICANT STUDIES Basic Metabolic Panel:   Recent Labs Lab 10/09/12 1704 10/09/12 2202 10/11/12 0640  NA 138  --  141  K 3.7  --  3.8  CL 99  --  104  CO2 26  --  27  GLUCOSE 87  --  170*  BUN 11  --  10  CREATININE 1.12* 1.20* 1.05  CALCIUM 9.6  --  9.2   Liver Function Tests:   Recent Labs Lab 10/09/12 1704  AST 15  ALT 12   ALKPHOS 78  BILITOT 0.3  PROT 7.5  ALBUMIN 2.9*   CBC:   Recent Labs Lab 10/09/12 1704 10/09/12 2202 10/10/12 2045  WBC 15.6* 13.2* 12.1*  NEUTROABS 11.3*  --   --   HGB 10.8* 10.4* 10.6*  HCT 33.5* 31.3* 32.4*  MCV 84.2 83.5 83.7  PLT 349 329 323   Coagulation:   Recent Labs Lab 10/09/12 1704  LABPROT 12.8  INR 0.97   Cardiac Enzymes:   Recent Labs Lab 10/09/12 2146 10/10/12 0640 10/10/12 1411  TROPONINI <0.30 <0.30 <0.30   Urinalysis: No results found for this basename: COLORURINE, APPERANCEUR, LABSPEC, PHURINE, GLUCOSEU, HGBUR, BILIRUBINUR, KETONESUR, PROTEINUR, UROBILINOGEN, NITRITE, LEUKOCYTESUR,  in the last 168 hours Lipid Panel    Component Value Date/Time   CHOL 165 10/10/2012 0640   TRIG 155* 10/10/2012 0640   HDL 73 10/10/2012 0640   CHOLHDL 2.3 10/10/2012 0640   VLDL 31 10/10/2012 0640   LDLCALC 61 10/10/2012 0640   HgbA1C  Lab Results  Component Value Date   HGBA1C 6.9* 10/10/2012    Urine Drug Screen:   No results found for this basename: labopia,  cocainscrnur,  labbenz,  amphetmu,  thcu,  labbarb    Alcohol Level: No results found for this basename: ETH,  in the last 168 hours  CT of the brain   10/11/2012   10/09/2012   No acute intracranial abnormalities.  MRI/A of the brain  Too large for MRI  CTA head and neck too large for CTA  2D Echocardiogram  EF 55-60% with no source of embolus.   Carotid Doppler    CXR  10/09/2012   Enlargement of cardiac silhouette with pulmonary vascular congestion. No acute abnormalities.   EKG  normal sinus rhythm.   Therapy Recommendations home health PT and OT  Physical Exam   Morbidly obese african american lady not in distress.Awake alert. Afebrile. Head is nontraumatic. Neck is supple without bruit. Hearing is normal. Cardiac exam no murmur or gallop. Lungs are clear to auscultation. Distal pulses are not well felt. Neurological Exam ; awake and alert oriented x3 with normal speech and language  function. Follows commands well. Extraocular moments are full range without nystagmus. Fundi were not visualized. Vision acuity and fields appear normal. There is old left Bell's palsy with facial moderate left facial weakness greater in the lower half than the upper half and synkinetic left facial involuntary movements.. Tongue is midline. Motor system exam reveals no upper or lower extended drift but mild 4/5 weakness on the left. Left grip is weak compared to the right. Diminished fine finger movements on the left. Orbits right over left approximately. Mild left hip flexor and ankle dorsiflexor weakness. Decreased subjective sensation on the left hemibody. Coordination is slow but accurate on the left. Gait was not tested. Plantars are downgoing. Deep tendon flexes are depressed bilaterally.  ASSESSMENT [name] is a 42 y.o. female presenting with worsening left-sided numbness and weakness. Right brain subcortical stroke suspected, too large for MRI and CT angio. Will get repeat CT to verify stroke. On no antiplatelets prior to admission. Now on clopidogrel 75 mg orally every day for secondary stroke prevention. Patient with resultant left hemisensory deficit and left hemiparesis. Work up underway.   Morbid obesity, Body mass index is 62.03 kg/(m^2).  Hx bell's palsy, 6-7 yrs ago per pt  Sjogren's syndrome  SLE Diabetes, HgbA1c 6.9 Hypertension Hyperlipidemia, LDL 61, on statin PTA, on statin now, goal LDL < 100 OSA on CPAP Hx stroke with resultant left hemiparesis  Hospital day # 2  TREATMENT/PLAN  Continue  clopidogrel 75 mg orally every day for secondary stroke prevention.  CT head to confirm stroke  F/u carotid dopplers  Home health PT and OT   Annie Main, MSN, RN, ANVP-BC, ANP-BC, [facility] Pager: [phone] 10/11/2012 8:44 AM  I have personally obtained a history, examined the patient, evaluated imaging results, and formulated the  assessment and plan of care. I agree with the above. Delia Heady, MD

## 2012-10-11 NOTE — Progress Notes (Signed)
Speech Language Pathology Dysphagia Treatment Patient Details Name: [name] MRN: [id number] DOB: 09/04/1970 Today's Date: 10/11/2012 Time: 9528-4132 SLP Time Calculation (min): 40 min  Assessment / Plan / Recommendation Clinical Impression  Pt seen today to assess tolerance of po diet, indication for dietary advancement, dysarthria tx and education.  Note diet has been advanced to a regular/thin - carb mod as per pt the pureed food made her nauseated.  Pt observed consuming a saltine cracker and gingerale via straw - self feeding.  Marginally improved swallow ability noted and pt is compensating at modified independence level for deficits.   She continues with decreased oral manipulation abilities resulting in decreased cohesiveness, oral pocketing and delays in transiting due to weakenss/discoordination.  Fortunately pt continuing to manually apply pressure to buccal cavity (for lateral sulci clearance) and conducting finger sweep to assure oral clearance adequate.  Eating appears to be laborious for this pt, hopeful she can maintain nutrition with level of oral dysphagia.   SLP helped pt to brush her teeth and educated her to importance of oral care given current deficits, xerostomia from Sjogren's.  Pt states her current swallow ability is at a level 5 of baseline being 10.  Given her intact awareness to deficits and marginal improvement in swallowing, agree with dietary advancement with strict precautions.     SLP did ask pt to order softer foods - or ground meats- if easier for her to swallow.  Further advised to assure not pocketing foods by using toothettes, finger sweeps or swish/expectorate water to clear.  Liquids are more efficient for this pt to consume at this point, ? if she could receive a liquid supplement to maximize nutritional intake due to level of dysphagia.    Continue to advise to start meal with drink given pt's xerostomia and deficits.   Pt's speech intelligiblity has  improved today significantly and she is using strategies to improve clarity - including slowing rate and overarticulating.  Introduced oral motor exercise to improve ROM and strength and advised those against resistance provide optimum results for increasing strength/ROM.  Pt reports tongue "feels less swollen today".     SLP to continue to follow for mitigation of dysphagia/dysarthria.  Pt agreeable to continue SLP services.  Note plan is for pt to dc home with home health per chart review.   Note results of head CT 10/11/12 continuing to be negative.     Diet Recommendation  Continue with Current Diet: Regular;Thin liquid (ground meat if problematic)    SLP Plan Continue with current plan of care   Pertinent Vitals/Pain Afebrile, decreased   Swallowing Goals  SLP Swallowing Goals Swallow Study Goal #2 - Progress: Progressing toward goal Goal #3: Pt will masticate soft solids and clear orally within 25 seconds to aid in determining readiness for dietary advancement.  Swallow Study Goal #3 - Progress: Met (diet advanced per MD)  General Temperature Spikes Noted: No Respiratory Status: Supplemental O2 delivered via (comment) Behavior/Cognition: Alert;Cooperative;Pleasant mood (c/o nausea) Oral Cavity - Dentition: Adequate natural dentition Patient Positioning: Upright in bed  Oral Cavity - Oral Hygiene   Helped pt to brush her teeth and educated pt to importance of oral care  Dysphagia Treatment Treatment focused on: Skilled observation of diet tolerance;Patient/family/caregiver education;Upgraded PO texture trials;Utilization of compensatory strategies Family/Caregiver Educated: pt and pt's mother Treatment Methods/Modalities: Skilled observation;Differential diagnosis Patient observed directly with PO's: Yes Type of PO's observed: Regular;Thin liquids Feeding: Able to feed self Liquids provided via: Straw (placing straw on  right side of lips to allow sealing) Oral Phase Signs &  Symptoms: Right pocketing;Left pocketing;Prolonged mastication;Prolonged bolus formation;Prolonged oral phase (pt again using her hands to apply pressure to buccal area) Pharyngeal Phase Signs & Symptoms: Suspected delayed swallow initiation;Delayed cough (pt states cough not related to "choking") Amount of cueing:  (mod independence)   GO    Donavan Burnet, MS [facility] SLP [phone]

## 2012-10-11 NOTE — Progress Notes (Signed)
PT Cancellation Note  Patient Details Name: [name] MRN: [id number] DOB: [birth year]   Cancelled Treatment:    Reason Eval/Treat Not Completed: Patient at procedure or test/unavailable.  Will try back another time.     Sunny Schlein, [place] [phone] 10/11/2012, 11:34 AM

## 2012-10-11 NOTE — Progress Notes (Signed)
OT Cancellation Note  Patient Details Name: [name] MRN: [id number] DOB: 1970/09/07   Cancelled Treatment:    Reason Eval/Treat Not Completed: Patient at procedure or test/ unavailable. Will continue to follow.  Evern Bio 10/11/2012, 9:35 AM

## 2012-10-11 NOTE — Evaluation (Signed)
Occupational Therapy Evaluation Patient Details Name: [name] MRN: [id number] DOB: 08-May-1971 Today's Date: 10/11/2012 Time: 3329-5188 OT Time Calculation (min): 18 min  OT Assessment / Plan / Recommendation Clinical Impression  Pt admitted with L sided weakness and numbness.  Thusfar CT negative for acute changes.  Pt has baseline weakness in her L UE per her report, but was able to use it as a functional assist PTA.  Inconsistencies noted in L UE strength and function from formal testing to clinical observation. Will follow for ADL training and to implement a L UE home program.  Anticipate she will be able to return home with her family's supervision.    OT Assessment  Patient needs continued OT Services    Follow Up Recommendations  Home health OT    Barriers to Discharge      Equipment Recommendations  None recommended by OT    Recommendations for Other Services    Frequency  Min 3X/week    Precautions / Restrictions Precautions Precautions: Fall Restrictions Weight Bearing Restrictions: No   Pertinent Vitals/Pain Chronic back pain, repositioned    ADL  Eating/Feeding: Independent Where Assessed - Eating/Feeding: Chair Grooming: Set up Where Assessed - Grooming: Unsupported sitting Upper Body Bathing: Minimal assistance Where Assessed - Upper Body Bathing: Unsupported sitting Lower Body Bathing: Moderate assistance Where Assessed - Lower Body Bathing: Unsupported sitting;Supported sit to stand Upper Body Dressing: Set up Where Assessed - Upper Body Dressing: Unsupported sitting Lower Body Dressing: Moderate assistance Where Assessed - Lower Body Dressing: Unsupported sitting;Supported sit to stand Toilet Transfer: Minimal assistance (hand held) Toilet Transfer Method: Sit to stand Toilet Transfer Equipment: Comfort height toilet Toileting - Clothing Manipulation and Hygiene: Minimal assistance Where Assessed - Toileting Clothing Manipulation and Hygiene:  Standing Transfers/Ambulation Related to ADLs: hand held assist for ambulation, min guard for sit to stand using both hands to push up from surface ADL Comments: Pt with inconsistencies in use and strength of L UE when tested vs. when observed/distracted.    OT Diagnosis: Generalized weakness;Hemiplegia non-dominant side  OT Problem List: Decreased strength;Decreased activity tolerance;Impaired balance (sitting and/or standing);Decreased coordination;Decreased knowledge of use of DME or AE;Cardiopulmonary status limiting activity;Obesity;Impaired sensation;Impaired UE functional use OT Treatment Interventions: Self-care/ADL training;Therapeutic exercise;DME and/or AE instruction;Patient/family education   OT Goals Acute Rehab OT Goals OT Goal Formulation: With patient Time For Goal Achievement: 10/25/12 Potential to Achieve Goals: Good ADL Goals Pt Will Perform Grooming: with supervision;Standing at sink ADL Goal: Grooming - Progress: Goal set today Pt Will Perform Lower Body Bathing: with supervision;Sitting, edge of bed;Sit to stand from bed;with adaptive equipment ADL Goal: Lower Body Bathing - Progress: Goal set today Pt Will Perform Lower Body Dressing: with supervision;Sitting, bed;Sit to stand from bed;with adaptive equipment ADL Goal: Lower Body Dressing - Progress: Goal set today Pt Will Transfer to Toilet: with supervision;Anterior-posterior transfer;Comfort height toilet ADL Goal: Toilet Transfer - Progress: Goal set today Pt Will Perform Toileting - Hygiene: with modified independence;Sit to stand from 3-in-1/toilet ADL Goal: Toileting - Hygiene - Progress: Goal set today Pt Will Perform Tub/Shower Transfer: Tub transfer;with supervision;Ambulation;Shower seat with back ADL Goal: Web designer - Progress: Goal set today Miscellaneous OT Goals Miscellaneous OT Goal #1: Pt will perform A/AROM home exercise program independently with L UE. OT Goal: Miscellaneous Goal #1 -  Progress: Goal set today  Visit Information  Last OT Received On: 10/11/12 Assistance Needed: +1 Reason Eval/Treat Not Completed: Patient at procedure or test/ unavailable    Subjective Data  Subjective: "I drop a lot of stuff with my L hand since my stroke." Patient Stated Goal: Return to PLOF.   Prior Functioning     Home Living Lives With: Alone Available Help at Discharge: Family;Available 24 hours/day Type of Home: House Home Access: Stairs to enter [employer] of Steps: 3 Entrance Stairs-Rails: Right Home Layout: One level Bathroom Shower/Tub: Engineer, manufacturing systems: Standard Bathroom Accessibility: Yes How Accessible: Accessible via walker Home Adaptive Equipment: Bedside commode/3-in-1;Shower chair with back;Walker - rolling Prior Function Level of Independence: Independent with assistive device(s) Able to Take Stairs?: Yes Driving: Yes Vocation: On disability Communication Communication: No difficulties Dominant Hand: Right         Vision/Perception Vision - History Baseline Vision: No visual deficits Patient Visual Report: No change from baseline   Cognition  Cognition Overall Cognitive Status: Appears within functional limits for tasks assessed/performed Arousal/Alertness: Awake/alert Orientation Level: Oriented X4 / Intact Behavior During Session: [facility] for tasks performed    Extremity/Trunk Assessment Right Upper Extremity Assessment RUE ROM/Strength/Tone: Within functional levels RUE Sensation: WFL - Light Touch;WFL - Proprioception RUE Coordination: WFL - gross/fine motor Left Upper Extremity Assessment LUE ROM/Strength/Tone: Deficits LUE ROM/Strength/Tone Deficits: 3-/5 shoulder, 3/5 elbow to forearm, unable to actively flex/ext wrist, gross grasp 3+/5 (inconsistent MMT results vs. observation, tone WNL) LUE Sensation: Deficits LUE Sensation Deficits: reports numbness and feeling of tightness LUE Coordination:  Deficits LUE Coordination Deficits: can stabilize objects for bilat tasks with visual attention, effectively used to push up from chair. Trunk Assessment Trunk Assessment: Normal     Mobility Bed Mobility Bed Mobility: Sit to Supine Sit to Supine: 6: Modified independent (Device/Increase time);HOB flat Details for Bed Mobility Assistance: increased time, labored effort - due mostly to obesity Transfers Transfers: Sit to Stand;Stand to Sit Sit to Stand: 4: Min guard;With upper extremity assist;From chair/3-in-1 Stand to Sit: 4: Min guard;With upper extremity assist;To bed Details for Transfer Assistance: increased time to achieve full upright standing, wide base of support     Exercise     Balance Balance Balance Assessed: Yes Static Sitting Balance Static Sitting - Balance Support: No upper extremity supported;Feet supported Static Sitting - Level of Assistance: 5: Stand by assistance Static Standing Balance Static Standing - Balance Support: No upper extremity supported Static Standing - Level of Assistance: 5: Stand by assistance   End of Session OT - End of Session Activity Tolerance: Patient tolerated treatment well Patient left: in bed;with call bell/phone within reach;with nursing in room  GO     Evern Bio 10/11/2012, 11:51 AM [phone]

## 2012-10-11 NOTE — Progress Notes (Signed)
PT has refused CPAP machine for the night. PT says she has been sick, vomiting and does not want to wear the CPAP mask. PT is currently on 2 lpm nasal cannula with o2 sats 100%, in no distress. RT advised PT to call if she changes her mind. CPAP machine is still at bedside if needed. RT will continue to assist as needed.

## 2012-10-11 NOTE — Progress Notes (Signed)
Patient have been having severe N&V for the entire day. Dr. Is a ware, new order given, will continue to monitor. Burnett Corrente, RN

## 2012-10-12 LAB — PROTEIN C, TOTAL: Protein C, Total: 81 % (ref 72–160)

## 2012-10-12 LAB — GLUCOSE, CAPILLARY: Glucose-Capillary: 152 mg/dL — ABNORMAL HIGH (ref 70–99)

## 2012-10-12 LAB — PROTEIN S, TOTAL: Protein S Ag, Total: 73 % (ref 60–150)

## 2012-10-12 MED ORDER — CLOPIDOGREL BISULFATE 75 MG PO TABS: 75.0000 mg | ORAL_TABLET | Freq: Every day | ORAL | Status: DC

## 2012-10-12 NOTE — Discharge Summary (Signed)
Physician Discharge Summary  Patient ID: Geniene List MRN: [id number] DOB/AGE: [birth year] 42 y.o.  Admit date: 10/09/2012 Discharge date: 10/12/2012  Primary Care Physician:  Ron Parker, MD   Discharge Diagnoses:    Principal Problem:   CVA (cerebral infarction) Active Problems:   SJOGREN'S SYNDROME   Chest pain   Leukocytosis   Anemia   DM (diabetes mellitus)   Chronic diastolic heart failure   Chronic respiratory failure   Morbid obesity      Medication List    TAKE these medications       acetaminophen 500 MG tablet  Commonly known as:  TYLENOL  Take 500-1,000 mg by mouth every 6 (six) hours as needed. For pain.     alendronate 70 MG tablet  Commonly known as:  FOSAMAX  Take 70 mg by mouth every 7 (seven) days. thursdays. Take with a full glass of water on an empty stomach.     ALPRAZolam 0.5 MG tablet  Commonly known as:  XANAX  Take 0.5 mg by mouth at bedtime.     aspirin EC 81 MG tablet  Take 81 mg by mouth daily.     azaTHIOprine 50 MG tablet  Commonly known as:  IMURAN  Take 50 mg by mouth 2 (two) times daily.     buPROPion 150 MG 12 hr tablet  Commonly known as:  WELLBUTRIN SR  Take 150 mg by mouth 2 (two) times daily.     cetirizine 10 MG tablet  Commonly known as:  ZYRTEC  Take 10 mg by mouth daily.     clopidogrel 75 MG tablet  Commonly known as:  PLAVIX  Take 1 tablet (75 mg total) by mouth daily with breakfast.     COREG 3.125 MG tablet  Generic drug:  carvedilol  Take 3.125 mg by mouth 2 (two) times daily with a meal.     cycloSPORINE 0.05 % ophthalmic emulsion  Commonly known as:  RESTASIS  Place 1 drop into both eyes 3 (three) times daily.     etonogestrel-ethinyl estradiol 0.12-0.015 MG/24HR vaginal ring  Commonly known as:  NUVARING  Place 1 each vaginally every 28 (twenty-eight) days. Insert vaginally and leave in place for 3 consecutive weeks, then remove for 1 week.     famotidine 20 MG tablet  Commonly known as:   PEPCID  Take 20 mg by mouth 2 (two) times daily.     gabapentin 300 MG capsule  Commonly known as:  NEURONTIN  Take 300 mg by mouth 2 (two) times daily.     hydroxychloroquine 200 MG tablet  Commonly known as:  PLAQUENIL  Take 200 mg by mouth 2 (two) times daily.     insulin regular human CONCENTRATED 500 UNIT/ML Soln injection  Commonly known as:  HUMULIN R  Inject 150-175 Units into the skin 3 (three) times daily. 3x a day (just before each meal) 150-150-175 units, and 1 cc syringes 3/day     INSULIN SYRINGE 1CC/29G 29G X 1/2" 1 ML Misc  Commonly known as:  B-D INSULIN SYRINGE  Use as directed three times daily. Diag. Code 250.0     isosorbide-hydrALAZINE 20-37.5 MG per tablet  Commonly known as:  BIDIL  Take 1 tablet by mouth 3 (three) times daily.     losartan 100 MG tablet  Commonly known as:  COZAAR  Take 50 mg by mouth daily.     potassium chloride SA 20 MEQ tablet  Commonly known as:  K-DUR,KLOR-CON  Take 20 mEq  by mouth 3 (three) times daily.     pravastatin 10 MG tablet  Commonly known as:  PRAVACHOL  Take 10 mg by mouth at bedtime.     PROAIR HFA 108 (90 BASE) MCG/ACT inhaler  Generic drug:  albuterol  Inhale 2 puffs into the lungs every 6 (six) hours as needed. For shortness of breath/wheeze     torsemide 10 MG tablet  Commonly known as:  DEMADEX  Take 10 mg by mouth 3 (three) times daily.     traZODone 100 MG tablet  Commonly known as:  DESYREL  Take 100 mg by mouth at bedtime.         Disposition and Follow-up:  Will be discharged home today in stable and improved condition. Has been advised to follow up with her PCP in 2 weeks.  Consults:  Neurology, Dr. Pearlean Brownie  Significant Diagnostic Studies:  Ct Head Wo Contrast  10/11/2012  *RADIOLOGY REPORT*  Clinical Data: Stroke.  Improving speech and left leg symptoms.  CT HEAD WITHOUT CONTRAST  Technique:  Contiguous axial images were obtained from the base of the skull through the vertex without  contrast.  Comparison: CT head without contrast 10/09/2012.  Findings: Gray-white differentiation is preserved at the insular cortex and within the basal ganglia.  No acute infarct or hemorrhage is present.  There is no mass lesion.  The ventricles are of normal size.  No significant extra-axial fluid collection is evident.  Polyps are again noted in the maxillary sinuses.  There is scattered opacification of ethmoid air cells.  The mastoid air cells are clear.  The osseous skull is intact.  IMPRESSION:  1.  Normal CT appearance of the brain. 2.  Sinus disease as described.   Original Report Authenticated By: Marin Roberts, M.D.    Dg Abd 2 Views  10/11/2012  *RADIOLOGY REPORT*  Clinical Data: Refractory vomiting, shortness of breath, history of CHF, diabetes, hypertension  ABDOMEN - 2 VIEW  Comparison: None  Findings: Normal bowel gas pattern. No bowel dilatation bowel wall thickening. No free peritoneal air. Osseous structures unremarkable. No urinary tract calcification.  IMPRESSION: Normal exam.   Original Report Authenticated By: Ulyses Southward, M.D.     Brief H and P: For complete details please refer to admission H and P, but in brief patient is a  42 year old woman presented to the emergency department with report of sudden onset left facial weakness, left upper and left lower extremity weakness. Initial evaluation in the emergency department was negative and patient was referred for admission to [facility] for neurology consultation and further evaluation.  Patient reports she felt fine today and took a nap at about noon. When she woke up approximately 3 PM she noted the left side of her face was weak, her speech was slurred and she had weakness of her left arm and leg. Family reports this weakness is new. She does report a history of left-sided weakness of unclear severity. Review of primary care notes over the last several months unrevealing. Of interest they Bell's palsy as listed in the past  but family notes again that the left-sided weakness is clearly new.  After she woke up she did have some chest pain. She reports the chest pain has been going on on a daily basis for the last several weeks and occurs multiple times a day without specific aggravating or alleviating factors. It is described as being sharp and responds spontaneously. We were asked to admit her for further evaluation and management.  Hospital Course:  Principal Problem:   CVA (cerebral infarction) Active Problems:   SJOGREN'S SYNDROME   Chest pain   Leukocytosis   Anemia   DM (diabetes mellitus)   Chronic diastolic heart failure   Chronic respiratory failure   Morbid obesity   Suspected CVA -CT negative for CVA, however she was too large for MRI or CTA. -Has been started on plavix for secondary stroke prevention. -ECHO/Doppler ok.  Rest of chronic medical conditions have been stable and her home medications have not been altered.  Time spent on Discharge: Greater than 30 minutes.  SignedChaya Jan [facility] Pager: [phone] 10/12/2012, 6:48 PM

## 2012-10-12 NOTE — Progress Notes (Signed)
Stroke Team Progress Note  HISTORY [name] is a 42 y.o. female with a history of lupus diagnosed in 2003 who went to sleep around noon 10/09/2012 and awoke with left-sided numbness and weakness. The first ER note is at 4:24 PM and given and she was already outside of window, TPA was not considered.  She states that the symptoms started while asleep, and have been persistent since that time. She had a stroke in 2011 with subsequent left-sided weakness, however this is clearly much worse than typical for her. Patient was not a TPA candidate secondary to delay in arrival. She was admitted  for further evaluation and treatment.  SUBJECTIVE Female at bedside. States she plans to take care of patient at home. Repeat CT head and carotid dopplers are unremarkable.  OBJECTIVE Most recent Vital Signs: Filed Vitals:   10/11/12 1432 10/11/12 1808 10/12/12 0152 10/12/12 0447  BP: 146/79 149/88 157/67 140/51  Pulse: 88 68 88 78  Temp: 97.5 F (36.4 C) 97.7 F (36.5 C) 97.4 F (36.3 C) 99 F (37.2 C)  TempSrc: Oral Oral Oral Oral  Resp: 18 18 20 18   Height:      Weight:      SpO2: 100% 100% 99% 99%   CBG (last 3)   Recent Labs  10/12/12 0010 10/12/12 0628 10/12/12 0823  GLUCAP 141* 152* 143*    IV Fluid Intake:   . sodium chloride 75 mL/hr at 10/11/12 2229    MEDICATIONS  . ALPRAZolam  0.5 mg Oral QHS  . aspirin EC  81 mg Oral Daily  . azaTHIOprine  50 mg Oral BID  . buPROPion  150 mg Oral BID  . carvedilol  3.125 mg Oral BID WC  . clopidogrel  75 mg Oral Q breakfast  . cycloSPORINE  1 drop Both Eyes TID  . enoxaparin (LOVENOX) injection  40 mg Subcutaneous Q24H  . famotidine  20 mg Oral BID  . gabapentin  300 mg Oral BID  . hydroxychloroquine  200 mg Oral BID  . insulin aspart  0-15 Units Subcutaneous TID WC  . insulin aspart  4 Units Subcutaneous TID WC  . isosorbide-hydrALAZINE  1 tablet Oral TID  . loratadine  10 mg Oral Daily  . losartan  50 mg Oral Daily  . potassium  chloride SA  20 mEq Oral TID  . senna-docusate  2 tablet Oral QHS  . simvastatin  5 mg Oral q1800  . torsemide  10 mg Oral TID  . traZODone  100 mg Oral QHS   PRN:  albuterol, ondansetron (ZOFRAN) IV, promethazine  Diet:  Carb Control 1 thin liquids Activity:  OOB with assistance DVT Prophylaxis:  Lovenox 40 mg sq daily   CLINICALLY SIGNIFICANT STUDIES Basic Metabolic Panel:   Recent Labs Lab 10/09/12 1704 10/09/12 2202 10/11/12 0640  NA 138  --  141  K 3.7  --  3.8  CL 99  --  104  CO2 26  --  27  GLUCOSE 87  --  170*  BUN 11  --  10  CREATININE 1.12* 1.20* 1.05  CALCIUM 9.6  --  9.2   Liver Function Tests:   Recent Labs Lab 10/09/12 1704  AST 15  ALT 12  ALKPHOS 78  BILITOT 0.3  PROT 7.5  ALBUMIN 2.9*   CBC:   Recent Labs Lab 10/09/12 1704 10/09/12 2202 10/10/12 2045  WBC 15.6* 13.2* 12.1*  NEUTROABS 11.3*  --   --   HGB 10.8* 10.4*  10.6*  HCT 33.5* 31.3* 32.4*  MCV 84.2 83.5 83.7  PLT 349 329 323   Coagulation:   Recent Labs Lab 10/09/12 1704  LABPROT 12.8  INR 0.97   Cardiac Enzymes:   Recent Labs Lab 10/09/12 2146 10/10/12 0640 10/10/12 1411  TROPONINI <0.30 <0.30 <0.30   Urinalysis: No results found for this basename: COLORURINE, APPERANCEUR, LABSPEC, PHURINE, GLUCOSEU, HGBUR, BILIRUBINUR, KETONESUR, PROTEINUR, UROBILINOGEN, NITRITE, LEUKOCYTESUR,  in the last 168 hours Lipid Panel    Component Value Date/Time   CHOL 165 10/10/2012 0640   TRIG 155* 10/10/2012 0640   HDL 73 10/10/2012 0640   CHOLHDL 2.3 10/10/2012 0640   VLDL 31 10/10/2012 0640   LDLCALC 61 10/10/2012 0640   HgbA1C  Lab Results  Component Value Date   HGBA1C 6.9* 10/11/2012    Urine Drug Screen:   No results found for this basename: labopia,  cocainscrnur,  labbenz,  amphetmu,  thcu,  labbarb    Alcohol Level: No results found for this basename: ETH,  in the last 168 hours  CT of the brain   10/11/2012   10/09/2012   No acute intracranial  abnormalities.  MRI/A of the brain  Too large for MRI  CTA head and neck too large for CTA  2D Echocardiogram  EF 55-60% with no source of embolus.   Carotid Doppler  No significant stenosis  CXR  10/09/2012   Enlargement of cardiac silhouette with pulmonary vascular congestion. No acute abnormalities.   EKG  normal sinus rhythm.   Therapy Recommendations home health PT and OT  Physical Exam   Morbidly obese african american lady not in distress.Awake alert. Afebrile. Head is nontraumatic. Neck is supple without bruit. Hearing is normal. Cardiac exam no murmur or gallop. Lungs are clear to auscultation. Distal pulses are not well felt. Neurological Exam ; awake and alert oriented x3 with normal speech and language function. Follows commands well. Extraocular moments are full range without nystagmus. Fundi were not visualized. Vision acuity and fields appear normal. There is old left Bell's palsy with facial moderate left facial weakness greater in the lower half than the upper half and synkinetic left facial involuntary movements.. Tongue is midline. Motor system exam reveals no upper or lower extended drift but mild 4/5 weakness on the left. Left grip is weak compared to the right. Diminished fine finger movements on the left. Orbits right over left approximately. Mild left hip flexor and ankle dorsiflexor weakness. Decreased subjective sensation on the left hemibody. Coordination is slow but accurate on the left. Gait was not tested. Plantars are downgoing. Deep tendon flexes are depressed bilaterally.  ASSESSMENT [name] is a 42 y.o. female presenting with worsening left-sided numbness and weakness. Right brain subcortical stroke suspected, too large for MRI and CT angio. Will get repeat CT to verify stroke. On no antiplatelets prior to admission. Now on clopidogrel 75 mg orally every day for secondary stroke prevention. Patient with resultant left hemisensory deficit and left  hemiparesis. Work up underway.   Morbid obesity, Body mass index is 62.03 kg/(m^2).  Hx bell's palsy, 6-7 yrs ago per pt  Sjogren's syndrome  SLE Diabetes, HgbA1c 6.9 Hypertension Hyperlipidemia, LDL 61, on statin PTA, on statin now, goal LDL < 100 OSA on CPAP Hx stroke with resultant left hemiparesis  Hospital day # 3  TREATMENT/PLAN  Continue  clopidogrel 75 mg orally every day for secondary stroke prevention.      Dc home today  Home health PT and  OT     Delia Heady, MD

## 2012-10-12 NOTE — Progress Notes (Signed)
Discharge instructions and "[facility]" book given and explained to Pt and family; questions answered.

## 2012-10-12 NOTE — Progress Notes (Signed)
Physical Therapy Treatment Patient Details Name: [name] MRN: [id number] DOB: 1970/11/08 Today's Date: 10/12/2012 Time: 0865-7846 PT Time Calculation (min): 24 min  PT Assessment / Plan / Recommendation Comments on Treatment Session  Pt. with new c/o pain/swelling on dorsum of right foot which hindered her walking without a device.  Improved walking with use of RW.  she states she has RW at home and this therapist recommended she use it until R foot better.  She is agreeable.      Follow Up Recommendations  Home health PT;Supervision/Assistance - 24 hour     Does the patient have the potential to tolerate intense rehabilitation     Barriers to Discharge        Equipment Recommendations  None recommended by PT    Recommendations for Other Services    Frequency Min 4X/week   Plan Discharge plan remains appropriate    Precautions / Restrictions Precautions Precautions: Fall Restrictions Weight Bearing Restrictions: No Other Position/Activity Restrictions: painful for pt. to bear full weight on right foot but relieved with use of rolling walker   Pertinent Vitals/Pain Pain dorsum R foot, not rated but with increased difficulty walking.  Also, pt with 2/4 dyspnea with minimal amount of activity which she reports is her baseline    Mobility  Bed Mobility Bed Mobility: Not assessed Transfers Transfers: Sit to Stand;Stand to Sit Sit to Stand: 5: Supervision;From bed Stand to Sit: 4: Min guard;With upper extremity assist;To bed Details for Transfer Assistance: increased time to achieve full upright standing, wide base of support Ambulation/Gait Ambulation/Gait Assistance: 4: Min assist Ambulation Distance (Feet): 30 Feet Assistive device: 1 person hand held assist;Rolling walker Ambulation/Gait Assistance Details: Pt. with difficulty and antalgic gait due to right foot swelling and pain.  Once pt. provided with RW, she was able to ambulate with less and pain and difficulty.   RN [name] ,ade aware of pt's c/o swelling in right foot.  Gait Pattern: Step-to pattern;Decreased stride length;Antalgic;Wide base of support Gait velocity: slow General Gait Details: pt. steady with RW Stairs: No Modified Rankin (Stroke Patients Only) Pre-Morbid Rankin Score: Slight disability Modified Rankin: Moderate disability    Exercises     PT Diagnosis:    PT Problem List:   PT Treatment Interventions:     PT Goals Acute Rehab PT Goals Pt will go Sit to Stand: with modified independence;with upper extremity assist PT Goal: Sit to Stand - Progress: Progressing toward goal Pt will Ambulate: 16 - 50 feet;with modified independence PT Goal: Ambulate - Progress: Progressing toward goal  Visit Information  Last PT Received On: 10/12/12 Assistance Needed: +1    Subjective Data  Subjective: "My right foot is swollen on top".  Pt reports she was off her fluid medicine for  2 days recently.   Cognition  Cognition Overall Cognitive Status: Appears within functional limits for tasks assessed/performed Arousal/Alertness: Awake/alert Orientation Level: Oriented X4 / Intact Behavior During Session: WFL for tasks performed    Balance     End of Session PT - End of Session Equipment Utilized During Treatment: Gait belt;Oxygen Activity Tolerance: Patient limited by fatigue;Patient limited by pain;Other (comment) (right foot) Patient left: in bed;with call bell/phone within reach;with nursing in room;with family/visitor present;Other (comment) (pt. seated at EOB) Nurse Communication: Mobility status   GP     [name] 10/12/2012, 3:16 PM [name] PT Acute Rehab Services [phone] [place] [phone]

## 2012-10-12 NOTE — Progress Notes (Signed)
OT Cancellation Note  Patient Details Name: [name] MRN: [id number] DOB: 07-09-1971   Cancelled Treatment:    Reason Eval/Treat Not Completed: Fatigue/lethargy limiting ability to participate;Other (comment) (pt. reports "not feeling well all over")  Robet Leu 10/12/2012, 10:34 AM

## 2012-11-08 ENCOUNTER — Ambulatory Visit: Payer: Medicaid Other | Admitting: Endocrinology

## 2012-11-14 ENCOUNTER — Ambulatory Visit: Payer: Medicaid Other | Admitting: Endocrinology

## 2012-11-19 ENCOUNTER — Ambulatory Visit (INDEPENDENT_AMBULATORY_CARE_PROVIDER_SITE_OTHER): Payer: Medicaid Other | Admitting: Endocrinology

## 2012-11-19 VITALS — BP 130/70 | HR 109 | Wt >= 6400 oz

## 2012-11-19 DIAGNOSIS — E109 Type 1 diabetes mellitus without complications: Secondary | ICD-10-CM

## 2012-11-19 NOTE — Patient Instructions (Addendum)
please reduce "U-500" insulin to three times a day (just before each meal) 175-150-150 units (because it is 5x concentrated, draw it up as though it is 35-30-30 units).   check your blood sugar 2 times a day.  vary the time of day when you check, between before the 3 meals, and at bedtime.  also check if you have symptoms of your blood sugar being too high or too low.  please keep a record of the readings and bring it to your next appointment here.  please call us sooner if you are having low blood sugar episodes, or if it stays over 200.   Please come back for a follow-up appointment in 3 months.

## 2012-11-19 NOTE — Progress Notes (Signed)
Subjective:    Patient ID: [name], female    DOB: 17-Nov-1970, 42 y.o.   MRN: [id number]  HPI Pt returns for f/u of insulin-requiring DM (dx'ed 2011; no known complications; she has never had severe hypoglycemia or DKA).  no cbg record, but states cbg's are low almost daily, usually before breakfast.  Otherwise, There is no trend throughout the day.  She is on prednisone, 6 mg QD.   Past Medical History  Diagnosis Date  . IDDM 08/09/2010  . Bell's palsy 08/09/2010  . SLE 08/09/2010  . SJOGREN'S SYNDROME 08/09/2010  . Hypertension   . High cholesterol   . CHF (congestive heart failure)   . Anginal pain   . Shortness of breath     "all the time"  . Obstructive sleep apnea on CPAP 2011  . Migraines   . Stroke 2010    "left side is still weak from it; never fully regained full strength"  . Anxiety     Past Surgical History  Procedure Laterality Date  . Ectopic pregnancy surgery  1999  . Cardiac catheterization  02/07/12  . Hernia repair  1980's    umbilical    History   Social History  . Marital Status: Single    Spouse Name: N/A    Number of Children: N/A  . Years of Education: N/A   Occupational History  .      student   Social History Main Topics  . Smoking status: Former Smoker -- 3 years    Types: Cigarettes    Quit date: 06/01/1990  . Smokeless tobacco: Never Used  . Alcohol Use: No  . Drug Use: No  . Sexually Active: No   Other Topics Concern  . Not on file   Social History Narrative   Regular exercise-yes    Current Outpatient Prescriptions on File Prior to Visit  Medication Sig Dispense Refill  . acetaminophen (TYLENOL) 500 MG tablet Take 500-1,000 mg by mouth every 6 (six) hours as needed. For pain.      Marland Kitchen albuterol (PROAIR HFA) 108 (90 BASE) MCG/ACT inhaler Inhale 2 puffs into the lungs every 6 (six) hours as needed. For shortness of breath/wheeze      . alendronate (FOSAMAX) 70 MG tablet Take 70 mg by mouth every 7 (seven) days. thursdays. Take with  a full glass of water on an empty stomach.      . ALPRAZolam (XANAX) 0.5 MG tablet Take 0.5 mg by mouth at bedtime.      Marland Kitchen aspirin EC 81 MG tablet Take 81 mg by mouth daily.      Marland Kitchen azaTHIOprine (IMURAN) 50 MG tablet Take 50 mg by mouth 2 (two) times daily.       Marland Kitchen buPROPion (WELLBUTRIN SR) 150 MG 12 hr tablet Take 150 mg by mouth 2 (two) times daily.      . carvedilol (COREG) 3.125 MG tablet Take 3.125 mg by mouth 2 (two) times daily with a meal.      . cetirizine (ZYRTEC) 10 MG tablet Take 10 mg by mouth daily.      . clopidogrel (PLAVIX) 75 MG tablet Take 1 tablet (75 mg total) by mouth daily with breakfast.  30 tablet  1  . cycloSPORINE (RESTASIS) 0.05 % ophthalmic emulsion Place 1 drop into both eyes 3 (three) times daily.      Marland Kitchen etonogestrel-ethinyl estradiol (NUVARING) 0.12-0.015 MG/24HR vaginal ring Place 1 each vaginally every 28 (twenty-eight) days. Insert vaginally and leave in place  for 3 consecutive weeks, then remove for 1 week.      . famotidine (PEPCID) 20 MG tablet Take 20 mg by mouth 2 (two) times daily.      Marland Kitchen gabapentin (NEURONTIN) 300 MG capsule Take 300 mg by mouth 2 (two) times daily.       . hydroxychloroquine (PLAQUENIL) 200 MG tablet Take 200 mg by mouth 2 (two) times daily.       . insulin regular human CONCENTRATED (HUMULIN R) 500 UNIT/ML SOLN injection Inject 150-175 Units into the skin 3 (three) times daily. 3x a day (just before each meal) 175-150-150 units, and 1 cc syringes 3/day      . INSULIN SYRINGE 1CC/29G (B-D INSULIN SYRINGE) 29G X 1/2" 1 ML MISC Use as directed three times daily. Diag. Code 250.0  100 each  5  . isosorbide-hydrALAZINE (BIDIL) 20-37.5 MG per tablet Take 1 tablet by mouth 3 (three) times daily.      Marland Kitchen losartan (COZAAR) 100 MG tablet Take 50 mg by mouth daily.       . potassium chloride SA (K-DUR,KLOR-CON) 20 MEQ tablet Take 20 mEq by mouth 3 (three) times daily.       . pravastatin (PRAVACHOL) 10 MG tablet Take 10 mg by mouth at bedtime.         . torsemide (DEMADEX) 10 MG tablet Take 10 mg by mouth 3 (three) times daily.       . traZODone (DESYREL) 100 MG tablet Take 100 mg by mouth at bedtime.       No current facility-administered medications on file prior to visit.    Allergies  Allergen Reactions  . Oxycodone-Acetaminophen Hives    Family History  Problem Relation Age of Onset  . Diabetes Mother   . Hypertension Other   . Stroke Other   . Arthritis Other     BP 130/70  Pulse 109  Wt 409 lb (185.521 kg)  BMI 62.2 kg/m2  SpO2 97%   Review of Systems Denies LOC    Objective:   Physical Exam Pulses: dorsalis pedis intact bilat, but decreased (prob due to edema) Feet: no deformity. no ulcer on the feet. feet are of normal color and temp. 1+ bilat leg edema. There is bilateral onychomycosis.  Neuro: sensation is intact to touch on the feet.  Lab Results  Component Value Date   HGBA1C 6.9* 10/11/2012      Assessment & Plan:  DM: slightly overcontrolled

## 2013-01-23 ENCOUNTER — Other Ambulatory Visit: Payer: Self-pay

## 2013-02-18 ENCOUNTER — Emergency Department (HOSPITAL_COMMUNITY): Payer: Medicaid Other

## 2013-02-18 ENCOUNTER — Emergency Department (HOSPITAL_COMMUNITY)
Admission: EM | Admit: 2013-02-18 | Discharge: 2013-02-18 | Disposition: A | Discharge status: Home / Self Care | Payer: Medicaid Other | Attending: Emergency Medicine | Admitting: Emergency Medicine

## 2013-02-18 ENCOUNTER — Ambulatory Visit (INDEPENDENT_AMBULATORY_CARE_PROVIDER_SITE_OTHER): Payer: Medicaid Other | Admitting: Endocrinology

## 2013-02-18 ENCOUNTER — Encounter (HOSPITAL_COMMUNITY): Payer: Self-pay | Admitting: Nurse Practitioner

## 2013-02-18 ENCOUNTER — Encounter: Payer: Self-pay | Admitting: Endocrinology

## 2013-02-18 VITALS — BP 130/70 | HR 82 | Ht 68.0 in | Wt 397.0 lb

## 2013-02-18 DIAGNOSIS — I509 Heart failure, unspecified: Secondary | ICD-10-CM | POA: Insufficient documentation

## 2013-02-18 DIAGNOSIS — R0602 Shortness of breath: Secondary | ICD-10-CM

## 2013-02-18 DIAGNOSIS — H109 Unspecified conjunctivitis: Secondary | ICD-10-CM | POA: Insufficient documentation

## 2013-02-18 DIAGNOSIS — Z8673 Personal history of transient ischemic attack (TIA), and cerebral infarction without residual deficits: Secondary | ICD-10-CM | POA: Insufficient documentation

## 2013-02-18 DIAGNOSIS — R109 Unspecified abdominal pain: Secondary | ICD-10-CM | POA: Insufficient documentation

## 2013-02-18 DIAGNOSIS — Z794 Long term (current) use of insulin: Secondary | ICD-10-CM | POA: Insufficient documentation

## 2013-02-18 DIAGNOSIS — R112 Nausea with vomiting, unspecified: Secondary | ICD-10-CM | POA: Insufficient documentation

## 2013-02-18 DIAGNOSIS — E119 Type 2 diabetes mellitus without complications: Secondary | ICD-10-CM | POA: Insufficient documentation

## 2013-02-18 DIAGNOSIS — Z8669 Personal history of other diseases of the nervous system and sense organs: Secondary | ICD-10-CM | POA: Insufficient documentation

## 2013-02-18 DIAGNOSIS — Z87891 Personal history of nicotine dependence: Secondary | ICD-10-CM | POA: Insufficient documentation

## 2013-02-18 DIAGNOSIS — Z7982 Long term (current) use of aspirin: Secondary | ICD-10-CM | POA: Insufficient documentation

## 2013-02-18 DIAGNOSIS — N390 Urinary tract infection, site not specified: Secondary | ICD-10-CM | POA: Diagnosis present

## 2013-02-18 DIAGNOSIS — Z79899 Other long term (current) drug therapy: Secondary | ICD-10-CM | POA: Insufficient documentation

## 2013-02-18 DIAGNOSIS — I10 Essential (primary) hypertension: Secondary | ICD-10-CM | POA: Insufficient documentation

## 2013-02-18 DIAGNOSIS — G43909 Migraine, unspecified, not intractable, without status migrainosus: Secondary | ICD-10-CM | POA: Insufficient documentation

## 2013-02-18 DIAGNOSIS — Z7902 Long term (current) use of antithrombotics/antiplatelets: Secondary | ICD-10-CM | POA: Insufficient documentation

## 2013-02-18 DIAGNOSIS — Z8679 Personal history of other diseases of the circulatory system: Secondary | ICD-10-CM | POA: Insufficient documentation

## 2013-02-18 DIAGNOSIS — Z3202 Encounter for pregnancy test, result negative: Secondary | ICD-10-CM | POA: Insufficient documentation

## 2013-02-18 DIAGNOSIS — E7800 Pure hypercholesterolemia, unspecified: Secondary | ICD-10-CM | POA: Insufficient documentation

## 2013-02-18 DIAGNOSIS — F411 Generalized anxiety disorder: Secondary | ICD-10-CM | POA: Insufficient documentation

## 2013-02-18 LAB — URINALYSIS, ROUTINE W REFLEX MICROSCOPIC
Hgb urine dipstick: NEGATIVE
Specific Gravity, Urine: 1.04 — ABNORMAL HIGH (ref 1.005–1.030)
Urobilinogen, UA: 1 mg/dL (ref 0.0–1.0)

## 2013-02-18 LAB — MICROALBUMIN / CREATININE URINE RATIO
Creatinine,U: 698 mg/dL
Microalb Creat Ratio: 1.7 mg/g (ref 0.0–30.0)

## 2013-02-18 LAB — HEPATIC FUNCTION PANEL
ALT: 7 U/L (ref 0–35)
Alkaline Phosphatase: 92 U/L (ref 39–117)
Total Protein: 7.4 g/dL (ref 6.0–8.3)

## 2013-02-18 LAB — CBC
HCT: 31.9 % — ABNORMAL LOW (ref 36.0–46.0)
RDW: 15.1 % (ref 11.5–15.5)
WBC: 11.5 10*3/uL — ABNORMAL HIGH (ref 4.0–10.5)

## 2013-02-18 LAB — HEMOGLOBIN A1C: Hgb A1c MFr Bld: 5.6 % (ref 4.6–6.5)

## 2013-02-18 LAB — POCT I-STAT TROPONIN I: Troponin i, poc: 0.06 ng/mL (ref 0.00–0.08)

## 2013-02-18 LAB — URINE MICROSCOPIC-ADD ON

## 2013-02-18 LAB — BASIC METABOLIC PANEL
BUN: 10 mg/dL (ref 6–23)
Chloride: 102 mEq/L (ref 96–112)
GFR calc Af Amer: 89 mL/min — ABNORMAL LOW (ref >90)
GFR calc non Af Amer: 77 mL/min — ABNORMAL LOW (ref >90)
Potassium: 3.5 mEq/L (ref 3.5–5.1)
Sodium: 139 mEq/L (ref 135–145)

## 2013-02-18 LAB — PRO B NATRIURETIC PEPTIDE: Pro B Natriuretic peptide (BNP): 487.5 pg/mL — ABNORMAL HIGH (ref 0–125)

## 2013-02-18 MED ORDER — TORSEMIDE 10 MG PO TABS
10.0000 mg | ORAL_TABLET | ORAL | Status: AC
  Administered 2013-02-18: 10 mg via ORAL
  Filled 2013-02-18: qty 1

## 2013-02-18 MED ORDER — SODIUM CHLORIDE 0.9 % IV BOLUS (SEPSIS)
1000.0000 mL | Freq: Once | INTRAVENOUS | Status: AC
  Administered 2013-02-18: 1000 mL via INTRAVENOUS

## 2013-02-18 MED ORDER — PHENAZOPYRIDINE HCL 200 MG PO TABS: 200.0000 mg | ORAL_TABLET | Freq: Three times a day (TID) | ORAL | Status: AC

## 2013-02-18 MED ORDER — MORPHINE SULFATE 4 MG/ML IJ SOLN
4.0000 mg | Freq: Once | INTRAMUSCULAR | Status: AC
  Administered 2013-02-18: 4 mg via INTRAVENOUS
  Filled 2013-02-18: qty 1

## 2013-02-18 MED ORDER — ONDANSETRON HCL 4 MG/2ML IJ SOLN
4.0000 mg | Freq: Once | INTRAMUSCULAR | Status: AC
  Administered 2013-02-18: 4 mg via INTRAVENOUS
  Filled 2013-02-18: qty 2

## 2013-02-18 MED ORDER — CEPHALEXIN 500 MG PO CAPS: 500.0000 mg | ORAL_CAPSULE | Freq: Four times a day (QID) | ORAL | Status: DC

## 2013-02-18 MED ORDER — DEXTROSE 5 % IV SOLN
1.0000 g | Freq: Once | INTRAVENOUS | Status: AC
  Administered 2013-02-18: 1 g via INTRAVENOUS
  Filled 2013-02-18: qty 10

## 2013-02-18 NOTE — ED Provider Notes (Addendum)
History    CSN: [id number] Arrival date & time 02/18/13  1744  First MD Initiated Contact with Patient 02/18/13 1829     Chief Complaint  Patient presents with  . Shortness of Breath   (Consider location/radiation/quality/duration/timing/severity/associated sxs/prior Treatment) Patient is a 42 y.o. female presenting with shortness of breath. The history is provided by the patient.  Shortness of Breath Severity:  Moderate Onset quality:  Gradual Duration:  1 day Timing:  Intermittent Progression:  Worsening Chronicity:  Recurrent Context comment:  After vomiting Relieved by:  Nothing Worsened by:  Exertion Ineffective treatments:  Lying down, inhaler and oxygen Associated symptoms: abdominal pain and vomiting   Associated symptoms: no chest pain, no claudication, no cough, no diaphoresis, no ear pain, no fever, no headaches, no PND, no rash, no sore throat, no sputum production, no syncope and no wheezing   Abdominal pain:    Location:  Generalized   Quality:  Aching   Severity:  Moderate   Onset quality:  Gradual   Timing:  Constant   Progression:  Unchanged   Chronicity:  New Vomiting:    Quality:  Stomach contents (Nonbilious nonbloody)   Number of occurrences:  5   Severity:  Mild   Duration:  1 day   Timing:  Intermittent   Progression:  Unchanged Risk factors: obesity   Risk factors: no recent alcohol use, no hx of PE/DVT and no oral contraceptive use (Nuva ring)    Past Medical History  Diagnosis Date  . IDDM 08/09/2010  . Bell's palsy 08/09/2010  . SLE 08/09/2010  . SJOGREN'S SYNDROME 08/09/2010  . Hypertension   . High cholesterol   . CHF (congestive heart failure)   . Anginal pain   . Shortness of breath     "all the time"  . Obstructive sleep apnea on CPAP 2011  . Migraines   . Stroke 2010    "left side is still weak from it; never fully regained full strength"  . Anxiety    Past Surgical History  Procedure Laterality Date  . Ectopic pregnancy  surgery  1999  . Cardiac catheterization  02/07/12  . Hernia repair  1980's    umbilical   Family History  Problem Relation Age of Onset  . Diabetes Mother   . Hypertension Other   . Stroke Other   . Arthritis Other    History  Substance Use Topics  . Smoking status: Former Smoker -- 3 years    Types: Cigarettes    Quit date: 06/01/1990  . Smokeless tobacco: Never Used  . Alcohol Use: No   OB History   Grav Para Term Preterm Abortions TAB SAB Ect Mult Living                 Review of Systems  Constitutional: Negative for fever, chills, diaphoresis, activity change and appetite change.  HENT: Negative for ear pain, sore throat, rhinorrhea, sneezing, drooling and trouble swallowing.   Eyes: Negative for discharge and redness.  Respiratory: Positive for shortness of breath. Negative for cough, sputum production, chest tightness, wheezing and stridor.   Cardiovascular: Negative for chest pain, claudication, leg swelling, syncope and PND.  Gastrointestinal: Positive for nausea, vomiting and abdominal pain. Negative for diarrhea, constipation and blood in stool.  Genitourinary: Negative for difficulty urinating.  Musculoskeletal: Negative for myalgias and arthralgias.  Skin: Negative for pallor and rash.  Neurological: Negative for dizziness, syncope, speech difficulty, weakness, light-headedness and headaches.  Hematological: Negative for adenopathy. Does not  bruise/bleed easily.  Psychiatric/Behavioral: Negative for confusion and agitation.    Allergies  Oxycodone-acetaminophen  Home Medications   Current Outpatient Rx  Name  Route  Sig  Dispense  Refill  . acetaminophen (TYLENOL) 500 MG tablet   Oral   Take 500-1,000 mg by mouth every 6 (six) hours as needed. For pain.         Marland Kitchen albuterol (PROAIR HFA) 108 (90 BASE) MCG/ACT inhaler   Inhalation   Inhale 2 puffs into the lungs every 6 (six) hours as needed. For shortness of breath/wheeze         . alendronate  (FOSAMAX) 70 MG tablet   Oral   Take 70 mg by mouth every 7 (seven) days. thursdays. Take with a full glass of water on an empty stomach.         . ALPRAZolam (XANAX) 0.5 MG tablet   Oral   Take 0.5 mg by mouth at bedtime.         Marland Kitchen aspirin EC 81 MG tablet   Oral   Take 81 mg by mouth daily.         Marland Kitchen azaTHIOprine (IMURAN) 50 MG tablet   Oral   Take 50 mg by mouth 2 (two) times daily.          Marland Kitchen buPROPion (WELLBUTRIN SR) 150 MG 12 hr tablet   Oral   Take 150 mg by mouth 2 (two) times daily.         . carvedilol (COREG) 3.125 MG tablet   Oral   Take 3.125 mg by mouth 2 (two) times daily with a meal.         . cetirizine (ZYRTEC) 10 MG tablet   Oral   Take 10 mg by mouth daily.         . citalopram (CELEXA) 20 MG tablet   Oral   Take 20 mg by mouth every evening.         . clopidogrel (PLAVIX) 75 MG tablet   Oral   Take 1 tablet (75 mg total) by mouth daily with breakfast.   30 tablet   1   . cycloSPORINE (RESTASIS) 0.05 % ophthalmic emulsion   Both Eyes   Place 1 drop into both eyes 3 (three) times daily.         Marland Kitchen etonogestrel-ethinyl estradiol (NUVARING) 0.12-0.015 MG/24HR vaginal ring   Vaginal   Place 1 each vaginally every 28 (twenty-eight) days. Insert vaginally and leave in place for 3 consecutive weeks, then remove for 1 week.         . famotidine (PEPCID) 20 MG tablet   Oral   Take 20 mg by mouth 2 (two) times daily.         Marland Kitchen gabapentin (NEURONTIN) 300 MG capsule   Oral   Take 300 mg by mouth 2 (two) times daily.          . hydroxychloroquine (PLAQUENIL) 200 MG tablet   Oral   Take 200 mg by mouth 2 (two) times daily.          . insulin regular human CONCENTRATED (HUMULIN R) 500 UNIT/ML SOLN injection   Subcutaneous   Inject 150-175 Units into the skin 3 (three) times daily. 3x a day (just before each meal) 175units in the morning-150 units with lunch-125 units at dinner         . INSULIN SYRINGE 1CC/29G (B-D INSULIN  SYRINGE) 29G X 1/2" 1 ML MISC  Use as directed three times daily. Diag. Code 250.0   100 each   5   . isosorbide-hydrALAZINE (BIDIL) 20-37.5 MG per tablet   Oral   Take 1 tablet by mouth 3 (three) times daily.         Marland Kitchen losartan (COZAAR) 100 MG tablet   Oral   Take 50 mg by mouth daily.          . metoCLOPramide (REGLAN) 10 MG tablet   Oral   Take 10 mg by mouth 4 (four) times daily as needed (nausea).         . potassium chloride SA (K-DUR,KLOR-CON) 20 MEQ tablet   Oral   Take 20 mEq by mouth 3 (three) times daily.          . pravastatin (PRAVACHOL) 10 MG tablet   Oral   Take 10 mg by mouth at bedtime.           . promethazine (PHENERGAN) 12.5 MG tablet   Oral   Take 12.5 mg by mouth every 6 (six) hours as needed for nausea.         Marland Kitchen torsemide (DEMADEX) 10 MG tablet   Oral   Take 10 mg by mouth 3 (three) times daily.          . traZODone (DESYREL) 100 MG tablet   Oral   Take 100 mg by mouth at bedtime.         . cephALEXin (KEFLEX) 500 MG capsule   Oral   Take 1 capsule (500 mg total) by mouth 4 (four) times daily.   28 capsule   0   . phenazopyridine (PYRIDIUM) 200 MG tablet   Oral   Take 1 tablet (200 mg total) by mouth 3 (three) times daily.   6 tablet   0    BP 141/64  Pulse 78  Temp(Src) 98.2 F (36.8 C) (Oral)  Resp 17  SpO2 100% Physical Exam  Constitutional: She is oriented to person, place, and time. She appears well-developed and well-nourished. No distress.  HENT:  Head: Normocephalic and atraumatic.  Right Ear: External ear normal.  Left Ear: External ear normal.  Eyes: Conjunctivae and EOM are normal. Right eye exhibits no discharge. Left eye exhibits no discharge.  Neck: Normal range of motion. Neck supple. No JVD present.  Cardiovascular: Normal rate, regular rhythm and normal heart sounds.  Exam reveals no gallop and no friction rub.   No murmur heard. Pulmonary/Chest: Effort normal and breath sounds normal. No  stridor. No respiratory distress. She has no wheezes. She has no rales. She exhibits no tenderness.  Abdominal: Soft. Bowel sounds are normal. She exhibits no distension. There is tenderness (generalized). There is no rebound and no guarding.  Musculoskeletal: Normal range of motion. She exhibits no edema.  Neurological: She is alert and oriented to person, place, and time.  Skin: Skin is warm. No rash noted. She is not diaphoretic.  Psychiatric: She has a normal mood and affect. Her behavior is normal.    ED Course  Procedures (including critical care time) Labs Reviewed  CBC - Abnormal; Notable for the following:    WBC 11.5 (*)    Hemoglobin 10.8 (*)    HCT 31.9 (*)    All other components within normal limits  BASIC METABOLIC PANEL - Abnormal; Notable for the following:    Glucose, Bld 179 (*)    GFR calc non Af Amer 77 (*)    GFR calc Af Amer 89 (*)  All other components within normal limits  PRO B NATRIURETIC PEPTIDE - Abnormal; Notable for the following:    Pro B Natriuretic peptide (BNP) 487.5 (*)    All other components within normal limits  URINALYSIS, ROUTINE W REFLEX MICROSCOPIC - Abnormal; Notable for the following:    Color, Urine AMBER (*)    APPearance HAZY (*)    Specific Gravity, Urine 1.040 (*)    Bilirubin Urine SMALL (*)    Ketones, ur >80 (*)    Protein, ur >300 (*)    Nitrite POSITIVE (*)    Leukocytes, UA MODERATE (*)    All other components within normal limits  HEPATIC FUNCTION PANEL - Abnormal; Notable for the following:    Albumin 3.1 (*)    All other components within normal limits  URINE MICROSCOPIC-ADD ON - Abnormal; Notable for the following:    Squamous Epithelial / LPF MANY (*)    Bacteria, UA MANY (*)    All other components within normal limits  URINE CULTURE  LIPASE, BLOOD  POCT I-STAT TROPONIN I  POCT PREGNANCY, URINE   Dg Chest 2 View  02/18/2013   *RADIOLOGY REPORT*  Clinical Data: Shortness of breath  CHEST - 2 VIEW   Comparison:  October 09, 2012  Findings:  Lungs are clear.  Heart is mildly enlarged with normal pulmonary vascularity.  No adenopathy.  No bone lesions.  IMPRESSION: No edema or consolidation.  Stable mild cardiac enlargement.   Original Report Authenticated By: Bretta Bang, M.D.   1. UTI (urinary tract infection)   2. Shortness of breath    Results for orders placed during the hospital encounter of 02/18/13  CBC      Result Value Range   WBC 11.5 (*) 4.0 - 10.5 K/uL   RBC 3.90  3.87 - 5.11 MIL/uL   Hemoglobin 10.8 (*) 12.0 - 15.0 g/dL   HCT 16.1 (*) 09.6 - 04.5 %   MCV 81.8  78.0 - 100.0 fL   MCH 27.7  26.0 - 34.0 pg   MCHC 33.9  30.0 - 36.0 g/dL   RDW 40.9  81.1 - 91.4 %   Platelets 367  150 - 400 K/uL  BASIC METABOLIC PANEL      Result Value Range   Sodium 139  135 - 145 mEq/L   Potassium 3.5  3.5 - 5.1 mEq/L   Chloride 102  96 - 112 mEq/L   CO2 23  19 - 32 mEq/L   Glucose, Bld 179 (*) 70 - 99 mg/dL   BUN 10  6 - 23 mg/dL   Creatinine, Ser 7.82  0.50 - 1.10 mg/dL   Calcium 9.7  8.4 - 95.6 mg/dL   GFR calc non Af Amer 77 (*) >90 mL/min   GFR calc Af Amer 89 (*) >90 mL/min  PRO B NATRIURETIC PEPTIDE      Result Value Range   Pro B Natriuretic peptide (BNP) 487.5 (*) 0 - 125 pg/mL  URINALYSIS, ROUTINE W REFLEX MICROSCOPIC      Result Value Range   Color, Urine AMBER (*) YELLOW   APPearance HAZY (*) CLEAR   Specific Gravity, Urine 1.040 (*) 1.005 - 1.030   pH 6.5  5.0 - 8.0   Glucose, UA NEGATIVE  NEGATIVE mg/dL   Hgb urine dipstick NEGATIVE  NEGATIVE   Bilirubin Urine SMALL (*) NEGATIVE   Ketones, ur >80 (*) NEGATIVE mg/dL   Protein, ur >213 (*) NEGATIVE mg/dL   Urobilinogen, UA 1.0  0.0 -  1.0 mg/dL   Nitrite POSITIVE (*) NEGATIVE   Leukocytes, UA MODERATE (*) NEGATIVE  LIPASE, BLOOD      Result Value Range   Lipase 24  11 - 59 U/L  HEPATIC FUNCTION PANEL      Result Value Range   Total Protein 7.4  6.0 - 8.3 g/dL   Albumin 3.1 (*) 3.5 - 5.2 g/dL   AST 15  0 -  37 U/L   ALT 7  0 - 35 U/L   Alkaline Phosphatase 92  39 - 117 U/L   Total Bilirubin 0.3  0.3 - 1.2 mg/dL   Bilirubin, Direct <1.6  0.0 - 0.3 mg/dL   Indirect Bilirubin NOT CALCULATED  0.3 - 0.9 mg/dL  URINE MICROSCOPIC-ADD ON      Result Value Range   Squamous Epithelial / LPF MANY (*) RARE   WBC, UA 11-20  <3 WBC/hpf   Bacteria, UA MANY (*) RARE   Urine-Other MUCOUS PRESENT    POCT I-STAT TROPONIN I      Result Value Range   Troponin i, poc 0.06  0.00 - 0.08 ng/mL   Comment 3           POCT PREGNANCY, URINE      Result Value Range   Preg Test, Ur NEGATIVE  NEGATIVE    Date: 02/19/2013  Rate: 84  Rhythm: normal sinus rhythm  QRS Axis: normal  Intervals: normal  ST/T Wave abnormalities: nonspecific T wave changes  Conduction Disutrbances:none  Narrative Interpretation: NSR  Old EKG Reviewed: unchanged   MDM  Karle Barr 42 y.o. who presents emergency department for shortness of breath following nausea and vomiting over the past 24 hours. Shortness of breath specifically started after the nausea and vomiting. She has not been able to hold down her diuretics due to vomiting. Patient appears anxious. Afebrile vital signs stable. Patient is morbidly obese. No respiratory distress. Moving appropriate bilaterally. No signs or symptoms of peritonitis. Patient did have generalized abdominal tenderness to palpation on exam. Work patient out for acute CHF exacerbation, abdominal etiologies of her symptoms including pancreatitis hepatitis, UTI.   Patient has UA consistent with UTI. BNP slightly elevated. Also likely slightly dehydrated. Patient has not been taking her torsemide she's been having nausea and vomiting. Will treat UTI. Give Rocephin emergency department we'll DC home with 7 days Keflex. She was given a dose of torsemide, which was given to her in the emergency department. She was instructed to followup with her cardiologist in the next week. She was given strong return cautions  including worsening shortness of breath, or any other alarming or concerning symptoms or issues. Shortness of breath is likely related to not taking torsemide she's been nauseated and having vomiting. Doubt ACS as troponin is negative, patient is not having chest pain, and this has been occurring for approximately 24 hours. Doubt PE as I have an alternative diagnosis, and patient improved symptomatic control.  Labs reviewed, EKG reviewed, imaging reviewed. I discussed patient's care at my attending, Dr. Jeraldine Loots.   Sena Hitch, MD 02/19/13 1096  Sena Hitch, MD 04/11/13 1409

## 2013-02-18 NOTE — Patient Instructions (Addendum)
please reduce "U-500" insulin to three times a day (just before each meal) 175-150-125 units (because it is 5x concentrated, draw it up as though it is 35-30-25 units).   check your blood sugar 2 times a day.  vary the time of day when you check, between before the 3 meals, and at bedtime.  also check if you have symptoms of your blood sugar being too high or too low.  please keep a record of the readings and bring it to your next appointment here.  please call us sooner if you are having low blood sugar episodes, or if it stays over 200.   Please come back for a follow-up appointment in 3 months.   blood tests are being requested for you today.  We'll contact you with results.

## 2013-02-18 NOTE — ED Notes (Signed)
Reports gi upset, vomiting for past 3 days. Pt with history of CHF. States today she started to feel sob, unrelieved by nebs at home. Also taking reglan and phenergan with no relief of nausea. Pt is able to speak in short phrases but visibly labored breathing.

## 2013-02-18 NOTE — ED Notes (Signed)
MD at bedside. 

## 2013-02-18 NOTE — Progress Notes (Signed)
Subjective:    Patient ID: [name], female    DOB: 1970-12-01, 42 y.o.   MRN: [id number]  HPI Pt returns for f/u of insulin-requiring DM (dx'ed 2011; she has mild if any neuropathy of the lower extremities; she has no known associated complications; she has never had severe hypoglycemia or DKA).  no cbg record, but states cbg's continue to be low almost daily, before breakfast.  Otherwise, There is no trend throughout the day.  She is on prednisone, 4 mg QD.  Past Medical History  Diagnosis Date  . IDDM 08/09/2010  . Bell's palsy 08/09/2010  . SLE 08/09/2010  . SJOGREN'S SYNDROME 08/09/2010  . Hypertension   . High cholesterol   . CHF (congestive heart failure)   . Anginal pain   . Shortness of breath     "all the time"  . Obstructive sleep apnea on CPAP 2011  . Migraines   . Stroke 2010    "left side is still weak from it; never fully regained full strength"  . Anxiety     Past Surgical History  Procedure Laterality Date  . Ectopic pregnancy surgery  1999  . Cardiac catheterization  02/07/12  . Hernia repair  1980's    umbilical    History   Social History  . Marital Status: Single    Spouse Name: N/A    Number of Children: N/A  . Years of Education: N/A   Occupational History  .      student   Social History Main Topics  . Smoking status: Former Smoker -- 3 years    Types: Cigarettes    Quit date: 06/01/1990  . Smokeless tobacco: Never Used  . Alcohol Use: No  . Drug Use: No  . Sexually Active: No   Other Topics Concern  . Not on file   Social History Narrative   Regular exercise-yes    No current facility-administered medications on file prior to visit.   Current Outpatient Prescriptions on File Prior to Visit  Medication Sig Dispense Refill  . acetaminophen (TYLENOL) 500 MG tablet Take 500-1,000 mg by mouth every 6 (six) hours as needed. For pain.      Marland Kitchen albuterol (PROAIR HFA) 108 (90 BASE) MCG/ACT inhaler Inhale 2 puffs into the lungs every 6 (six)  hours as needed. For shortness of breath/wheeze      . alendronate (FOSAMAX) 70 MG tablet Take 70 mg by mouth every 7 (seven) days. thursdays. Take with a full glass of water on an empty stomach.      . ALPRAZolam (XANAX) 0.5 MG tablet Take 0.5 mg by mouth at bedtime.      Marland Kitchen aspirin EC 81 MG tablet Take 81 mg by mouth daily.      Marland Kitchen azaTHIOprine (IMURAN) 50 MG tablet Take 50 mg by mouth 2 (two) times daily.       Marland Kitchen buPROPion (WELLBUTRIN SR) 150 MG 12 hr tablet Take 150 mg by mouth 2 (two) times daily.      . carvedilol (COREG) 3.125 MG tablet Take 3.125 mg by mouth 2 (two) times daily with a meal.      . cetirizine (ZYRTEC) 10 MG tablet Take 10 mg by mouth daily.      . clopidogrel (PLAVIX) 75 MG tablet Take 1 tablet (75 mg total) by mouth daily with breakfast.  30 tablet  1  . cycloSPORINE (RESTASIS) 0.05 % ophthalmic emulsion Place 1 drop into both eyes 3 (three) times daily.      Marland Kitchen  etonogestrel-ethinyl estradiol (NUVARING) 0.12-0.015 MG/24HR vaginal ring Place 1 each vaginally every 28 (twenty-eight) days. Insert vaginally and leave in place for 3 consecutive weeks, then remove for 1 week.      . famotidine (PEPCID) 20 MG tablet Take 20 mg by mouth 2 (two) times daily.      Marland Kitchen gabapentin (NEURONTIN) 300 MG capsule Take 300 mg by mouth 2 (two) times daily.       . hydroxychloroquine (PLAQUENIL) 200 MG tablet Take 200 mg by mouth 2 (two) times daily.       . insulin regular human CONCENTRATED (HUMULIN R) 500 UNIT/ML SOLN injection Inject 150-175 Units into the skin 3 (three) times daily. 3x a day (just before each meal) 175units in the morning-150 units with lunch-125 units at dinner      . INSULIN SYRINGE 1CC/29G (B-D INSULIN SYRINGE) 29G X 1/2" 1 ML MISC Use as directed three times daily. Diag. Code 250.0  100 each  5  . isosorbide-hydrALAZINE (BIDIL) 20-37.5 MG per tablet Take 1 tablet by mouth 3 (three) times daily.      Marland Kitchen losartan (COZAAR) 100 MG tablet Take 50 mg by mouth daily.       .  potassium chloride SA (K-DUR,KLOR-CON) 20 MEQ tablet Take 20 mEq by mouth 3 (three) times daily.       . pravastatin (PRAVACHOL) 10 MG tablet Take 10 mg by mouth at bedtime.        . torsemide (DEMADEX) 10 MG tablet Take 10 mg by mouth 3 (three) times daily.       . traZODone (DESYREL) 100 MG tablet Take 100 mg by mouth at bedtime.        Allergies  Allergen Reactions  . Oxycodone-Acetaminophen Hives    Family History  Problem Relation Age of Onset  . Diabetes Mother   . Hypertension Other   . Stroke Other   . Arthritis Other     BP 130/70  Pulse 82  Ht 5\' 8"  (1.727 m)  Wt 397 lb (180.078 kg)  BMI 60.38 kg/m2  SpO2 98%  Review of Systems Denies LOC.  She has lost weight, due to her efforts.    Objective:   Physical Exam VITAL SIGNS:  See vs page GENERAL: no distress   Lab Results  Component Value Date   HGBA1C 5.6 02/18/2013      Assessment & Plan:  DM: overcontrolled.  This insulin regimen was chosen from multiple options, as it best matches his insulin to his changing requirements throughout the day.  The benefits of glycemic control must be weighed against the risks of hypoglycemia.

## 2013-02-19 NOTE — ED Provider Notes (Signed)
  I performed a history and physical examination of [name] and discussed her management with Dr. Malen Gauze.  I agree with the history, physical, assessment, and plan of care, with the following exceptions: None   Decklin Weddington, Elvis Coil, MD 02/19/13 1715

## 2013-02-20 ENCOUNTER — Inpatient Hospital Stay (HOSPITAL_COMMUNITY)
Admission: EM | Admit: 2013-02-20 | Discharge: 2013-02-23 | DRG: 391 | Disposition: A | Discharge status: Home / Self Care | Payer: Medicaid Other | Attending: Internal Medicine | Admitting: Internal Medicine

## 2013-02-20 ENCOUNTER — Encounter: Payer: Self-pay | Admitting: Obstetrics & Gynecology

## 2013-02-20 ENCOUNTER — Emergency Department (HOSPITAL_COMMUNITY): Payer: Medicaid Other

## 2013-02-20 ENCOUNTER — Encounter (HOSPITAL_COMMUNITY): Payer: Self-pay | Admitting: *Deleted

## 2013-02-20 DIAGNOSIS — R5383 Other fatigue: Secondary | ICD-10-CM | POA: Diagnosis present

## 2013-02-20 DIAGNOSIS — Z794 Long term (current) use of insulin: Secondary | ICD-10-CM

## 2013-02-20 DIAGNOSIS — K7689 Other specified diseases of liver: Secondary | ICD-10-CM | POA: Diagnosis present

## 2013-02-20 DIAGNOSIS — J189 Pneumonia, unspecified organism: Secondary | ICD-10-CM

## 2013-02-20 DIAGNOSIS — R5381 Other malaise: Secondary | ICD-10-CM | POA: Diagnosis present

## 2013-02-20 DIAGNOSIS — I509 Heart failure, unspecified: Secondary | ICD-10-CM | POA: Diagnosis present

## 2013-02-20 DIAGNOSIS — K2100 Gastro-esophageal reflux disease with esophagitis, without bleeding: Principal | ICD-10-CM | POA: Diagnosis present

## 2013-02-20 DIAGNOSIS — D649 Anemia, unspecified: Secondary | ICD-10-CM

## 2013-02-20 DIAGNOSIS — R109 Unspecified abdominal pain: Secondary | ICD-10-CM

## 2013-02-20 DIAGNOSIS — I5042 Chronic combined systolic (congestive) and diastolic (congestive) heart failure: Secondary | ICD-10-CM | POA: Diagnosis present

## 2013-02-20 DIAGNOSIS — Z7982 Long term (current) use of aspirin: Secondary | ICD-10-CM

## 2013-02-20 DIAGNOSIS — E119 Type 2 diabetes mellitus without complications: Secondary | ICD-10-CM

## 2013-02-20 DIAGNOSIS — Z87891 Personal history of nicotine dependence: Secondary | ICD-10-CM

## 2013-02-20 DIAGNOSIS — I5032 Chronic diastolic (congestive) heart failure: Secondary | ICD-10-CM

## 2013-02-20 DIAGNOSIS — I5023 Acute on chronic systolic (congestive) heart failure: Secondary | ICD-10-CM | POA: Diagnosis present

## 2013-02-20 DIAGNOSIS — Z6841 Body Mass Index (BMI) 40.0 and over, adult: Secondary | ICD-10-CM

## 2013-02-20 DIAGNOSIS — R0602 Shortness of breath: Secondary | ICD-10-CM | POA: Diagnosis present

## 2013-02-20 DIAGNOSIS — R079 Chest pain, unspecified: Secondary | ICD-10-CM

## 2013-02-20 DIAGNOSIS — Z833 Family history of diabetes mellitus: Secondary | ICD-10-CM

## 2013-02-20 DIAGNOSIS — R160 Hepatomegaly, not elsewhere classified: Secondary | ICD-10-CM

## 2013-02-20 DIAGNOSIS — I10 Essential (primary) hypertension: Secondary | ICD-10-CM | POA: Diagnosis present

## 2013-02-20 DIAGNOSIS — E1159 Type 2 diabetes mellitus with other circulatory complications: Secondary | ICD-10-CM | POA: Diagnosis present

## 2013-02-20 DIAGNOSIS — R112 Nausea with vomiting, unspecified: Secondary | ICD-10-CM

## 2013-02-20 DIAGNOSIS — I69998 Other sequelae following unspecified cerebrovascular disease: Secondary | ICD-10-CM

## 2013-02-20 DIAGNOSIS — I5022 Chronic systolic (congestive) heart failure: Secondary | ICD-10-CM | POA: Diagnosis present

## 2013-02-20 DIAGNOSIS — F411 Generalized anxiety disorder: Secondary | ICD-10-CM | POA: Diagnosis present

## 2013-02-20 DIAGNOSIS — M329 Systemic lupus erythematosus, unspecified: Secondary | ICD-10-CM | POA: Diagnosis present

## 2013-02-20 DIAGNOSIS — Z823 Family history of stroke: Secondary | ICD-10-CM

## 2013-02-20 DIAGNOSIS — G510 Bell's palsy: Secondary | ICD-10-CM | POA: Diagnosis present

## 2013-02-20 DIAGNOSIS — E1169 Type 2 diabetes mellitus with other specified complication: Secondary | ICD-10-CM | POA: Diagnosis present

## 2013-02-20 DIAGNOSIS — M3500 Sicca syndrome, unspecified: Secondary | ICD-10-CM | POA: Diagnosis present

## 2013-02-20 HISTORY — DX: Gastro-esophageal reflux disease without esophagitis: K21.9

## 2013-02-20 LAB — URINE MICROSCOPIC-ADD ON

## 2013-02-20 LAB — COMPREHENSIVE METABOLIC PANEL
AST: 24 U/L (ref 0–37)
Albumin: 3 g/dL — ABNORMAL LOW (ref 3.5–5.2)
BUN: 9 mg/dL (ref 6–23)
Calcium: 9.4 mg/dL (ref 8.4–10.5)
Creatinine, Ser: 0.93 mg/dL (ref 0.50–1.10)
GFR calc non Af Amer: 75 mL/min — ABNORMAL LOW (ref >90)

## 2013-02-20 LAB — URINALYSIS, ROUTINE W REFLEX MICROSCOPIC
Protein, ur: NEGATIVE mg/dL
Specific Gravity, Urine: 1.017 (ref 1.005–1.030)
Urobilinogen, UA: 0.2 mg/dL (ref 0.0–1.0)

## 2013-02-20 LAB — LIPASE, BLOOD: Lipase: 22 U/L (ref 11–59)

## 2013-02-20 LAB — GLUCOSE, CAPILLARY: Glucose-Capillary: 142 mg/dL — ABNORMAL HIGH (ref 70–99)

## 2013-02-20 LAB — CBC
HCT: 32.7 % — ABNORMAL LOW (ref 36.0–46.0)
Hemoglobin: 10.8 g/dL — ABNORMAL LOW (ref 12.0–15.0)
MCH: 27.4 pg (ref 26.0–34.0)
MCHC: 33 g/dL (ref 30.0–36.0)
MCV: 83 fL (ref 78.0–100.0)

## 2013-02-20 LAB — URINE CULTURE

## 2013-02-20 LAB — CBC WITH DIFFERENTIAL/PLATELET
Basophils Absolute: 0 10*3/uL (ref 0.0–0.1)
Basophils Relative: 0 % (ref 0–1)
Eosinophils Relative: 1 % (ref 0–5)
HCT: 31.6 % — ABNORMAL LOW (ref 36.0–46.0)
MCH: 27.4 pg (ref 26.0–34.0)
MCHC: 33.2 g/dL (ref 30.0–36.0)
MCV: 82.5 fL (ref 78.0–100.0)
Monocytes Absolute: 0.8 10*3/uL (ref 0.1–1.0)
RDW: 15.3 % (ref 11.5–15.5)

## 2013-02-20 LAB — PRO B NATRIURETIC PEPTIDE: Pro B Natriuretic peptide (BNP): 397.9 pg/mL — ABNORMAL HIGH (ref 0–125)

## 2013-02-20 LAB — APTT: aPTT: 26 seconds (ref 24–37)

## 2013-02-20 LAB — TROPONIN I: Troponin I: <0.3 ng/mL (ref <0.30)

## 2013-02-20 MED ORDER — DOCUSATE SODIUM 100 MG PO CAPS
100.0000 mg | ORAL_CAPSULE | Freq: Two times a day (BID) | ORAL | Status: DC
  Administered 2013-02-20 – 2013-02-23 (×3): 100 mg via ORAL
  Filled 2013-02-20 (×7): qty 1

## 2013-02-20 MED ORDER — ONDANSETRON HCL 4 MG PO TABS
4.0000 mg | ORAL_TABLET | Freq: Four times a day (QID) | ORAL | Status: DC | PRN
  Filled 2013-02-20: qty 1

## 2013-02-20 MED ORDER — METOCLOPRAMIDE HCL 5 MG/ML IJ SOLN
10.0000 mg | Freq: Once | INTRAMUSCULAR | Status: DC
  Administered 2013-02-20: 10 mg via INTRAMUSCULAR
  Filled 2013-02-20: qty 2

## 2013-02-20 MED ORDER — SIMVASTATIN 10 MG PO TABS
10.0000 mg | ORAL_TABLET | Freq: Every day | ORAL | Status: DC
  Administered 2013-02-20 – 2013-02-22 (×3): 10 mg via ORAL
  Filled 2013-02-20 (×4): qty 1

## 2013-02-20 MED ORDER — IOHEXOL 300 MG/ML  SOLN
25.0000 mL | INTRAMUSCULAR | Status: AC
  Administered 2013-02-20: 25 mL via ORAL

## 2013-02-20 MED ORDER — IPRATROPIUM BROMIDE 0.02 % IN SOLN
0.5000 mg | Freq: Once | RESPIRATORY_TRACT | Status: AC
  Administered 2013-02-20: 0.5 mg via RESPIRATORY_TRACT
  Filled 2013-02-20: qty 2.5

## 2013-02-20 MED ORDER — HEPARIN SODIUM (PORCINE) 5000 UNIT/ML IJ SOLN
5000.0000 [IU] | Freq: Three times a day (TID) | INTRAMUSCULAR | Status: DC
  Administered 2013-02-20 – 2013-02-23 (×10): 5000 [IU] via SUBCUTANEOUS
  Filled 2013-02-20 (×12): qty 1

## 2013-02-20 MED ORDER — GABAPENTIN 300 MG PO CAPS
300.0000 mg | ORAL_CAPSULE | Freq: Two times a day (BID) | ORAL | Status: DC
  Administered 2013-02-20 – 2013-02-23 (×7): 300 mg via ORAL
  Filled 2013-02-20 (×8): qty 1

## 2013-02-20 MED ORDER — MORPHINE SULFATE 4 MG/ML IJ SOLN
4.0000 mg | Freq: Once | INTRAMUSCULAR | Status: AC
  Administered 2013-02-20: 4 mg via INTRAVENOUS
  Filled 2013-02-20: qty 1

## 2013-02-20 MED ORDER — CARVEDILOL 3.125 MG PO TABS
3.1250 mg | ORAL_TABLET | Freq: Two times a day (BID) | ORAL | Status: DC
  Administered 2013-02-20 – 2013-02-23 (×6): 3.125 mg via ORAL
  Filled 2013-02-20 (×8): qty 1

## 2013-02-20 MED ORDER — INSULIN REGULAR HUMAN (CONC) 500 UNIT/ML ~~LOC~~ SOLN: 175.0000 [IU] | Freq: Every day | SUBCUTANEOUS | Status: DC

## 2013-02-20 MED ORDER — CYCLOSPORINE 0.05 % OP EMUL
1.0000 [drp] | Freq: Three times a day (TID) | OPHTHALMIC | Status: DC
  Administered 2013-02-20 – 2013-02-22 (×7): 1 [drp] via OPHTHALMIC
  Filled 2013-02-20 (×12): qty 1

## 2013-02-20 MED ORDER — LOSARTAN POTASSIUM 50 MG PO TABS
50.0000 mg | ORAL_TABLET | Freq: Every day | ORAL | Status: DC
  Administered 2013-02-20 – 2013-02-23 (×4): 50 mg via ORAL
  Filled 2013-02-20 (×4): qty 1

## 2013-02-20 MED ORDER — LOSARTAN POTASSIUM 50 MG PO TABS: 50.0000 mg | ORAL_TABLET | Freq: Every day | ORAL | Status: DC

## 2013-02-20 MED ORDER — INSULIN REGULAR HUMAN (CONC) 500 UNIT/ML ~~LOC~~ SOLN: 150.0000 [IU] | Freq: Three times a day (TID) | SUBCUTANEOUS | Status: DC

## 2013-02-20 MED ORDER — ONDANSETRON HCL 4 MG/2ML IJ SOLN: 4.0000 mg | Freq: Four times a day (QID) | INTRAMUSCULAR | Status: DC | PRN

## 2013-02-20 MED ORDER — ONDANSETRON HCL 4 MG/2ML IJ SOLN
4.0000 mg | Freq: Once | INTRAMUSCULAR | Status: AC
  Administered 2013-02-20: 4 mg via INTRAVENOUS

## 2013-02-20 MED ORDER — TRAZODONE HCL 100 MG PO TABS
100.0000 mg | ORAL_TABLET | Freq: Every day | ORAL | Status: DC
  Administered 2013-02-20 – 2013-02-22 (×3): 100 mg via ORAL
  Filled 2013-02-20 (×4): qty 1

## 2013-02-20 MED ORDER — POLYETHYLENE GLYCOL 3350 17 G PO PACK
17.0000 g | PACK | Freq: Every day | ORAL | Status: DC | PRN
  Filled 2013-02-20: qty 1

## 2013-02-20 MED ORDER — LORATADINE 10 MG PO TABS
10.0000 mg | ORAL_TABLET | Freq: Every day | ORAL | Status: DC
  Administered 2013-02-20 – 2013-02-23 (×4): 10 mg via ORAL
  Filled 2013-02-20 (×4): qty 1

## 2013-02-20 MED ORDER — METOCLOPRAMIDE HCL 5 MG/ML IJ SOLN: 10.0000 mg | Freq: Once | INTRAMUSCULAR | Status: DC

## 2013-02-20 MED ORDER — BIOTENE DRY MOUTH MT LIQD
15.0000 mL | Freq: Two times a day (BID) | OROMUCOSAL | Status: DC
  Administered 2013-02-20 – 2013-02-23 (×5): 15 mL via OROMUCOSAL

## 2013-02-20 MED ORDER — AZATHIOPRINE 50 MG PO TABS
50.0000 mg | ORAL_TABLET | Freq: Two times a day (BID) | ORAL | Status: DC
  Administered 2013-02-20 – 2013-02-23 (×6): 50 mg via ORAL
  Filled 2013-02-20 (×7): qty 1

## 2013-02-20 MED ORDER — SORBITOL 70 % SOLN
30.0000 mL | Freq: Every day | Status: DC | PRN
  Filled 2013-02-20: qty 30

## 2013-02-20 MED ORDER — ONDANSETRON HCL 4 MG/2ML IJ SOLN
INTRAMUSCULAR | Status: AC
  Filled 2013-02-20: qty 2

## 2013-02-20 MED ORDER — TORSEMIDE 10 MG PO TABS
10.0000 mg | ORAL_TABLET | Freq: Three times a day (TID) | ORAL | Status: DC
  Administered 2013-02-20 – 2013-02-23 (×9): 10 mg via ORAL
  Filled 2013-02-20 (×11): qty 1

## 2013-02-20 MED ORDER — INSULIN ASPART 100 UNIT/ML ~~LOC~~ SOLN: 0.0000 [IU] | Freq: Every day | SUBCUTANEOUS | Status: DC

## 2013-02-20 MED ORDER — ISOSORB DINITRATE-HYDRALAZINE 20-37.5 MG PO TABS
1.0000 | ORAL_TABLET | Freq: Three times a day (TID) | ORAL | Status: DC
  Administered 2013-02-20 – 2013-02-23 (×9): 1 via ORAL
  Filled 2013-02-20 (×11): qty 1

## 2013-02-20 MED ORDER — CEPHALEXIN 500 MG PO CAPS
500.0000 mg | ORAL_CAPSULE | Freq: Four times a day (QID) | ORAL | Status: DC
  Administered 2013-02-20 (×2): 500 mg via ORAL
  Filled 2013-02-20 (×6): qty 1

## 2013-02-20 MED ORDER — SODIUM CHLORIDE 0.9 % IV SOLN
INTRAVENOUS | Status: DC
  Administered 2013-02-20 – 2013-02-21 (×4): via INTRAVENOUS
  Administered 2013-02-22: 1000 mL via INTRAVENOUS
  Administered 2013-02-22: 800 mL via INTRAVENOUS

## 2013-02-20 MED ORDER — POTASSIUM CHLORIDE CRYS ER 20 MEQ PO TBCR
20.0000 meq | EXTENDED_RELEASE_TABLET | Freq: Three times a day (TID) | ORAL | Status: DC
  Administered 2013-02-20 – 2013-02-23 (×9): 20 meq via ORAL
  Filled 2013-02-20 (×11): qty 1

## 2013-02-20 MED ORDER — ALBUTEROL SULFATE HFA 108 (90 BASE) MCG/ACT IN AERS: 2.0000 | INHALATION_SPRAY | Freq: Four times a day (QID) | RESPIRATORY_TRACT | Status: DC | PRN

## 2013-02-20 MED ORDER — PROMETHAZINE HCL 12.5 MG PO TABS
12.5000 mg | ORAL_TABLET | Freq: Four times a day (QID) | ORAL | Status: DC | PRN
  Filled 2013-02-20: qty 1

## 2013-02-20 MED ORDER — METOCLOPRAMIDE HCL 10 MG PO TABS: 10.0000 mg | ORAL_TABLET | Freq: Four times a day (QID) | ORAL | Status: DC | PRN

## 2013-02-20 MED ORDER — ONDANSETRON HCL 4 MG/2ML IJ SOLN
4.0000 mg | Freq: Once | INTRAMUSCULAR | Status: AC
  Administered 2013-02-20: 4 mg via INTRAVENOUS
  Filled 2013-02-20: qty 2

## 2013-02-20 MED ORDER — FAMOTIDINE 20 MG PO TABS
20.0000 mg | ORAL_TABLET | Freq: Two times a day (BID) | ORAL | Status: DC
  Administered 2013-02-20 – 2013-02-23 (×7): 20 mg via ORAL
  Filled 2013-02-20 (×8): qty 1

## 2013-02-20 MED ORDER — ALENDRONATE SODIUM 70 MG PO TABS
70.0000 mg | ORAL_TABLET | ORAL | Status: DC
  Filled 2013-02-20: qty 1

## 2013-02-20 MED ORDER — INSULIN REGULAR HUMAN (CONC) 500 UNIT/ML ~~LOC~~ SOLN: 125.0000 [IU] | Freq: Every day | SUBCUTANEOUS | Status: DC

## 2013-02-20 MED ORDER — CITALOPRAM HYDROBROMIDE 20 MG PO TABS
20.0000 mg | ORAL_TABLET | Freq: Every evening | ORAL | Status: DC
  Administered 2013-02-20 – 2013-02-22 (×3): 20 mg via ORAL
  Filled 2013-02-20 (×4): qty 1

## 2013-02-20 MED ORDER — ALPRAZOLAM 0.5 MG PO TABS
0.5000 mg | ORAL_TABLET | Freq: Every day | ORAL | Status: DC
  Administered 2013-02-20 – 2013-02-22 (×3): 0.5 mg via ORAL
  Filled 2013-02-20 (×3): qty 1

## 2013-02-20 MED ORDER — HYDROXYCHLOROQUINE SULFATE 200 MG PO TABS
200.0000 mg | ORAL_TABLET | Freq: Two times a day (BID) | ORAL | Status: DC
  Administered 2013-02-20 – 2013-02-23 (×6): 200 mg via ORAL
  Filled 2013-02-20 (×7): qty 1

## 2013-02-20 MED ORDER — IOHEXOL 300 MG/ML  SOLN
100.0000 mL | Freq: Once | INTRAMUSCULAR | Status: AC | PRN
  Administered 2013-02-20: 100 mL via INTRAVENOUS

## 2013-02-20 MED ORDER — MORPHINE SULFATE 2 MG/ML IJ SOLN: 1.0000 mg | INTRAMUSCULAR | Status: DC | PRN

## 2013-02-20 MED ORDER — CLOPIDOGREL BISULFATE 75 MG PO TABS
75.0000 mg | ORAL_TABLET | Freq: Every day | ORAL | Status: DC
  Filled 2013-02-20: qty 1

## 2013-02-20 MED ORDER — ASPIRIN EC 81 MG PO TBEC
81.0000 mg | DELAYED_RELEASE_TABLET | Freq: Every day | ORAL | Status: DC
  Administered 2013-02-20 – 2013-02-23 (×4): 81 mg via ORAL
  Filled 2013-02-20 (×4): qty 1

## 2013-02-20 MED ORDER — INSULIN ASPART 100 UNIT/ML ~~LOC~~ SOLN
0.0000 [IU] | Freq: Three times a day (TID) | SUBCUTANEOUS | Status: DC
  Administered 2013-02-20: 3 [IU] via SUBCUTANEOUS
  Administered 2013-02-21 (×3): 2 [IU] via SUBCUTANEOUS
  Administered 2013-02-22: 3 [IU] via SUBCUTANEOUS

## 2013-02-20 MED ORDER — ALUM & MAG HYDROXIDE-SIMETH 200-200-20 MG/5ML PO SUSP: 30.0000 mL | Freq: Four times a day (QID) | ORAL | Status: DC | PRN

## 2013-02-20 MED ORDER — ALBUTEROL SULFATE (5 MG/ML) 0.5% IN NEBU
2.5000 mg | INHALATION_SOLUTION | Freq: Once | RESPIRATORY_TRACT | Status: AC
  Administered 2013-02-20: 2.5 mg via RESPIRATORY_TRACT
  Filled 2013-02-20: qty 0.5

## 2013-02-20 MED ORDER — DIPHENHYDRAMINE HCL 50 MG/ML IJ SOLN
25.0000 mg | Freq: Once | INTRAMUSCULAR | Status: AC
  Administered 2013-02-20: 25 mg via INTRAVENOUS
  Filled 2013-02-20: qty 1

## 2013-02-20 MED ORDER — BUPROPION HCL ER (SR) 150 MG PO TB12
150.0000 mg | ORAL_TABLET | Freq: Two times a day (BID) | ORAL | Status: DC
  Administered 2013-02-20 – 2013-02-23 (×6): 150 mg via ORAL
  Filled 2013-02-20 (×7): qty 1

## 2013-02-20 MED ORDER — INSULIN REGULAR HUMAN (CONC) 500 UNIT/ML ~~LOC~~ SOLN: 150.0000 [IU] | Freq: Every day | SUBCUTANEOUS | Status: DC

## 2013-02-20 MED ORDER — SODIUM CHLORIDE 0.9 % IV SOLN: INTRAVENOUS | Status: AC

## 2013-02-20 NOTE — ED Notes (Signed)
Pt reports pain is gone. Pt sitting back in bed, sleeping.

## 2013-02-20 NOTE — ED Notes (Signed)
Pt seen here yesterday and diagnosed with uti.  Pt is having lower abdominal pain and started vomiting.  Pt developed chest pain and shortness of breath.  Pt has had 324mg  of ASA and one nitro.  Pt has history of heart failure.  Lungs clear.  Pt chest pain decreased with nitro.

## 2013-02-20 NOTE — ED Notes (Signed)
Attempt report x1  

## 2013-02-20 NOTE — ED Provider Notes (Signed)
History    CSN: [id number] Arrival date & time 02/20/13  0404  First MD Initiated Contact with Patient 02/20/13 0431     Chief Complaint  Patient presents with  . Chest Pain  . Abdominal Pain  . Shortness of Breath  . Emesis   (Consider location/radiation/quality/duration/timing/severity/associated sxs/prior Treatment) Patient is a 42 y.o. female presenting with chest pain, abdominal pain, shortness of breath, and vomiting. The history is provided by the patient.  Chest Pain Associated symptoms: abdominal pain, shortness of breath and vomiting   Abdominal Pain Associated symptoms include chest pain, abdominal pain and shortness of breath.  Shortness of Breath Associated symptoms: abdominal pain, chest pain and vomiting   Emesis Associated symptoms: abdominal pain   She has been sick for the last 5 days with nausea and vomiting. She states the vomiting makes her abdomen hurts and that pain radiates up into her chest and causes her to get short of breath. She denies fever but has had chills and sweats. There's been no diarrhea. She denies cough. She was seen in emergency yesterday and started on an antibiotic for urinary tract infection but she continues to vomiting continues to have abdominal pain and chest pain and dyspnea. Symptoms are severe. Nothing makes it better nothing makes it worse. Past Medical History  Diagnosis Date  . IDDM 08/09/2010  . Bell's palsy 08/09/2010  . SLE 08/09/2010  . SJOGREN'S SYNDROME 08/09/2010  . Hypertension   . High cholesterol   . CHF (congestive heart failure)   . Anginal pain   . Shortness of breath     "all the time"  . Obstructive sleep apnea on CPAP 2011  . Migraines   . Stroke 2010    "left side is still weak from it; never fully regained full strength"  . Anxiety    Past Surgical History  Procedure Laterality Date  . Ectopic pregnancy surgery  1999  . Cardiac catheterization  02/07/12  . Hernia repair  1980's    umbilical   Family  History  Problem Relation Age of Onset  . Diabetes Mother   . Hypertension Other   . Stroke Other   . Arthritis Other    History  Substance Use Topics  . Smoking status: Former Smoker -- 3 years    Types: Cigarettes    Quit date: 06/01/1990  . Smokeless tobacco: Never Used  . Alcohol Use: No   OB History   Grav Para Term Preterm Abortions TAB SAB Ect Mult Living                 Review of Systems  Respiratory: Positive for shortness of breath.   Cardiovascular: Positive for chest pain.  Gastrointestinal: Positive for vomiting and abdominal pain.  All other systems reviewed and are negative.    Allergies  Oxycodone-acetaminophen  Home Medications   Current Outpatient Rx  Name  Route  Sig  Dispense  Refill  . acetaminophen (TYLENOL) 500 MG tablet   Oral   Take 500-1,000 mg by mouth every 6 (six) hours as needed. For pain.         Marland Kitchen albuterol (PROAIR HFA) 108 (90 BASE) MCG/ACT inhaler   Inhalation   Inhale 2 puffs into the lungs every 6 (six) hours as needed. For shortness of breath/wheeze         . alendronate (FOSAMAX) 70 MG tablet   Oral   Take 70 mg by mouth every 7 (seven) days. thursdays. Take with a full glass  of water on an empty stomach.         . ALPRAZolam (XANAX) 0.5 MG tablet   Oral   Take 0.5 mg by mouth at bedtime.         Marland Kitchen aspirin EC 81 MG tablet   Oral   Take 81 mg by mouth daily.         Marland Kitchen azaTHIOprine (IMURAN) 50 MG tablet   Oral   Take 50 mg by mouth 2 (two) times daily.          Marland Kitchen buPROPion (WELLBUTRIN SR) 150 MG 12 hr tablet   Oral   Take 150 mg by mouth 2 (two) times daily.         . carvedilol (COREG) 3.125 MG tablet   Oral   Take 3.125 mg by mouth 2 (two) times daily with a meal.         . cephALEXin (KEFLEX) 500 MG capsule   Oral   Take 1 capsule (500 mg total) by mouth 4 (four) times daily.   28 capsule   0   . cetirizine (ZYRTEC) 10 MG tablet   Oral   Take 10 mg by mouth daily.         . citalopram  (CELEXA) 20 MG tablet   Oral   Take 20 mg by mouth every evening.         . clopidogrel (PLAVIX) 75 MG tablet   Oral   Take 1 tablet (75 mg total) by mouth daily with breakfast.   30 tablet   1   . cycloSPORINE (RESTASIS) 0.05 % ophthalmic emulsion   Both Eyes   Place 1 drop into both eyes 3 (three) times daily.         Marland Kitchen etonogestrel-ethinyl estradiol (NUVARING) 0.12-0.015 MG/24HR vaginal ring   Vaginal   Place 1 each vaginally every 28 (twenty-eight) days. Insert vaginally and leave in place for 3 consecutive weeks, then remove for 1 week.         . famotidine (PEPCID) 20 MG tablet   Oral   Take 20 mg by mouth 2 (two) times daily.         Marland Kitchen gabapentin (NEURONTIN) 300 MG capsule   Oral   Take 300 mg by mouth 2 (two) times daily.          . hydroxychloroquine (PLAQUENIL) 200 MG tablet   Oral   Take 200 mg by mouth 2 (two) times daily.          . insulin regular human CONCENTRATED (HUMULIN R) 500 UNIT/ML SOLN injection   Subcutaneous   Inject 150-175 Units into the skin 3 (three) times daily. 3x a day (just before each meal) 175units in the morning-150 units with lunch-125 units at dinner         . INSULIN SYRINGE 1CC/29G (B-D INSULIN SYRINGE) 29G X 1/2" 1 ML MISC      Use as directed three times daily. Diag. Code 250.0   100 each   5   . isosorbide-hydrALAZINE (BIDIL) 20-37.5 MG per tablet   Oral   Take 1 tablet by mouth 3 (three) times daily.         Marland Kitchen losartan (COZAAR) 100 MG tablet   Oral   Take 50 mg by mouth daily.          . metoCLOPramide (REGLAN) 10 MG tablet   Oral   Take 10 mg by mouth 4 (four) times daily as needed (nausea).         Marland Kitchen  potassium chloride SA (K-DUR,KLOR-CON) 20 MEQ tablet   Oral   Take 20 mEq by mouth 3 (three) times daily.          . pravastatin (PRAVACHOL) 10 MG tablet   Oral   Take 10 mg by mouth at bedtime.           . promethazine (PHENERGAN) 12.5 MG tablet   Oral   Take 12.5 mg by mouth every 6 (six)  hours as needed for nausea.         Marland Kitchen torsemide (DEMADEX) 10 MG tablet   Oral   Take 10 mg by mouth 3 (three) times daily.          . traZODone (DESYREL) 100 MG tablet   Oral   Take 100 mg by mouth at bedtime.          BP 126/80  Pulse 83  Temp(Src) 99 F (37.2 C)  Resp 31  SpO2 100% Physical Exam  Nursing note and vitals reviewed.  Morbidly obese 42 year old female, who appears uncomfortable and is tachypnea, but his in no acute distress. Vital signs are significant for tachypnea with respiratory rate of 31. Oxygen saturation is 100%, which is normal. Head is normocephalic and atraumatic. PERRLA, EOMI. Oropharynx is clear. Neck is nontender and supple without adenopathy or JVD. Back is nontender and there is no CVA tenderness. Lungs have a slightly prolonged exhalation phase without overt rales, wheezes, or rhonchi. Chest is nontender. Heart has regular rate and rhythm without murmur. Abdomen is soft, flat, with moderate tenderness across the upper abdomen. There is no rebound or guarding. There are no masses or hepatosplenomegaly and peristalsis is hypoactive. Extremities have 1+ edema, full range of motion is present. Skin is warm and dry without rash. Neurologic: Mental status is normal, cranial nerves are intact, there are no motor or sensory deficits.  ED Course  Procedures (including critical care time) Results for orders placed during the hospital encounter of 02/20/13  CBC WITH DIFFERENTIAL      Result Value Range   WBC 10.7 (*) 4.0 - 10.5 K/uL   RBC 3.83 (*) 3.87 - 5.11 MIL/uL   Hemoglobin 10.5 (*) 12.0 - 15.0 g/dL   HCT 16.1 (*) 09.6 - 04.5 %   MCV 82.5  78.0 - 100.0 fL   MCH 27.4  26.0 - 34.0 pg   MCHC 33.2  30.0 - 36.0 g/dL   RDW 40.9  81.1 - 91.4 %   Platelets 343  150 - 400 K/uL   Neutrophils Relative % 64  43 - 77 %   Neutro Abs 6.8  1.7 - 7.7 K/uL   Lymphocytes Relative 27  12 - 46 %   Lymphs Abs 2.9  0.7 - 4.0 K/uL   Monocytes Relative 7  3 -  12 %   Monocytes Absolute 0.8  0.1 - 1.0 K/uL   Eosinophils Relative 1  0 - 5 %   Eosinophils Absolute 0.1  0.0 - 0.7 K/uL   Basophils Relative 0  0 - 1 %   Basophils Absolute 0.0  0.0 - 0.1 K/uL  COMPREHENSIVE METABOLIC PANEL      Result Value Range   Sodium 142  135 - 145 mEq/L   Potassium 3.7  3.5 - 5.1 mEq/L   Chloride 106  96 - 112 mEq/L   CO2 24  19 - 32 mEq/L   Glucose, Bld 160 (*) 70 - 99 mg/dL   BUN 9  6 -  23 mg/dL   Creatinine, Ser 3.87  0.50 - 1.10 mg/dL   Calcium 9.4  8.4 - 56.4 mg/dL   Total Protein 6.9  6.0 - 8.3 g/dL   Albumin 3.0 (*) 3.5 - 5.2 g/dL   AST 24  0 - 37 U/L   ALT 17  0 - 35 U/L   Alkaline Phosphatase 82  39 - 117 U/L   Total Bilirubin 0.3  0.3 - 1.2 mg/dL   GFR calc non Af Amer 75 (*) >90 mL/min   GFR calc Af Amer 87 (*) >90 mL/min  LIPASE, BLOOD      Result Value Range   Lipase 22  11 - 59 U/L  TROPONIN I      Result Value Range   Troponin I <0.30  <0.30 ng/mL  PRO B NATRIURETIC PEPTIDE      Result Value Range   Pro B Natriuretic peptide (BNP) 397.9 (*) 0 - 125 pg/mL    US Abdomen Complete  02/20/2013   *RADIOLOGY REPORT*  Abdominal ultrasound  History:  Abdominal pain  Findings:  Gallbladder is visualized in multiple projections. There are no gallstones, gallbladder wall thickening, or pericholecystic fluid collection.  There is no intrahepatic, common hepatic, or common bile duct dilatation.  Pancreas appears normal.  In the left lobe of the liver, there is a mass-like area which is slightly hyperechoic compared the remainder of the liver measuring 7.6 x 5.2 x 7.8 cm in size.  No other focal liver lesions are identified.  Spleen is normal in size and homogeneous in echotexture.  Kidneys bilaterally appear within normal limits.  There is no ascites. Aorta is nonaneurysmal.  Inferior vena cava appears patent.  Conclusion:  Mass-like area in the left lobe of the liver.  This finding warrants further evaluation.  MRI with and without intravenous contrast  would be the imaging study of choice to further assess.  Study otherwise unremarkable.   Original Report Authenticated By: Bretta Bang, M.D.   Dg Chest Portable 1 View  02/20/2013   *RADIOLOGY REPORT*  Clinical Data: Chest pain, shortness of breath  PORTABLE CHEST - 1 VIEW  Comparison: Prior radiograph from 02/18/2013  Findings: Cardiomegaly is likely unchanged allowing for differences in technique.  Mediastinal silhouette is stable.  Current examination has been performed with a similar degree of lung inflation. No frank pulmonary edema is identified.  No pleural effusion or airspace consolidation.  No pneumothorax.  Osseous structures are unchanged.  IMPRESSION: Stable cardiomegaly without frank pulmonary edema or airspace consolidation.   Original Report Authenticated By: Rise Mu, M.D.      Date: 02/20/2013  Rate: 83  Rhythm: normal sinus rhythm  QRS Axis: normal  Intervals: normal  ST/T Wave abnormalities: nonspecific T wave changes  Conduction Disutrbances:none  Narrative Interpretation: Nonspecific T wave changes. When compared with ECG of 02/18/2013, no significant changes are seen.  Old EKG Reviewed: unchanged   1. Abdominal pain   2. Nausea & vomiting   3. Liver mass     MDM  Persistent nausea and vomiting and abdominal pain. Chest pain seems to be secondary to abdominal pain, but she will need to have cardiac markers checked. Chest x-ray or be obtained to rule out pneumonia. Her prior records were reviewed and her diagnosis of UTI was in an obviously contaminated specimens of urine will be repeated. She'll be given morphine for pain and ondansetron for nausea. She'll be given a therapeutic trial of albuterol for her dyspnea.  Nausea was  not adequately controlled with ondansetron. Ondansetron was repeated without control nausea. She was then given to metoclopramide which did give relief of nausea. Morphine didn't control her pain the pain started to occur. Initial  workup was unremarkable. On reexam, she continues to have upper abdominal tenderness and I was concerned about possible biliary tract disease. Ultrasound was ordered which has come back showing a mass in the liver with recommendation for MRI scan with and without contrast. This has been ordered. Case will be signed out to Dr. Rennis Chris.  Dione Booze, MD 02/20/13 [phone]

## 2013-02-20 NOTE — ED Notes (Signed)
Pt sitting up on side of bed and nauseated.  Medicated and will continue to monitor

## 2013-02-20 NOTE — ED Provider Notes (Signed)
1:10 PM patient continues to complain of nausea. She also complains of epigastric pain radiating to her chest that pain is under control. She requests additional medicine for nausea. In light of possible intra-abdominal abscess spoke with Dr.Chiu, plan admit medical surgical floor. Dr.Bleitz radiology suggests consultation with interventional radiology  Doug Sou, MD 02/20/13 1320

## 2013-02-20 NOTE — H&P (Signed)
[facility] History and Physical  [name] ZOX:[id number] DOB: 11-25-1970 DOA: 02/20/2013  Referring physician: Emergency Department PCP: Ron Parker, MD  Specialists:   Chief Complaint: Abd pain, N/V  HPI: [name] is a 42 y.o. female  With a hx of lupus, Sjogren's syndrome, diabetes, and chronic systolic chf who presents to the ED with complaints of upper quadrant abd pain with nausea/vomiting x one week. The patient was seen one day ago in the ED for the same complaints and was found to have a UA suggestive of a UTI. The patient was subsequently discharged home with Keflex after receiving rocephin. The pt returns with continued symptoms. In the ED, the patient was found to have a 7.6x5.2x7.8cm liver mass on [place] and a 5.9x7.7cm lesion seen on a follow up CT. The hospitalist service was consulted for possible admission. On further questioning, pt reports taking estrogen replacment x 94yrs.  Review of Systems: Nausea/vomiting, abdominal pain, decreased appetite, remainder of 10pt ROS reviewed and are negative  Past Medical History  Diagnosis Date  . IDDM 08/09/2010  . Bell's palsy 08/09/2010  . SLE 08/09/2010  . SJOGREN'S SYNDROME 08/09/2010  . Hypertension   . High cholesterol   . CHF (congestive heart failure)   . Anginal pain   . Shortness of breath     "all the time"  . Obstructive sleep apnea on CPAP 2011  . Migraines   . Stroke 2010    "left side is still weak from it; never fully regained full strength"  . Anxiety    Past Surgical History  Procedure Laterality Date  . Ectopic pregnancy surgery  1999  . Cardiac catheterization  02/07/12  . Hernia repair  1980's    umbilical   Social History:  reports that she quit smoking about 22 years ago. Her smoking use included Cigarettes. She smoked 0.00 packs per day for 3 years. She has never used smokeless tobacco. She reports that she does not drink alcohol or use illicit drugs.  where does patient live--home, ALF, SNF?  and with whom if at home?  Can patient participate in ADLs?  Allergies  Allergen Reactions  . Oxycodone-Acetaminophen Hives    Family History  Problem Relation Age of Onset  . Diabetes Mother   . Hypertension Other   . Stroke Other   . Arthritis Other     (be sure to complete)  Prior to Admission medications   Medication Sig Start Date End Date Taking? Authorizing Provider  acetaminophen (TYLENOL) 500 MG tablet Take 500-1,000 mg by mouth every 6 (six) hours as needed. For pain.   Yes Historical Provider, MD  albuterol (PROAIR HFA) 108 (90 BASE) MCG/ACT inhaler Inhale 2 puffs into the lungs every 6 (six) hours as needed. For shortness of breath/wheeze   Yes Historical Provider, MD  alendronate (FOSAMAX) 70 MG tablet Take 70 mg by mouth every 7 (seven) days. thursdays. Take with a full glass of water on an empty stomach.   Yes Historical Provider, MD  ALPRAZolam Prudy Feeler) 0.5 MG tablet Take 0.5 mg by mouth at bedtime.   Yes Historical Provider, MD  aspirin EC 81 MG tablet Take 81 mg by mouth daily.   Yes Historical Provider, MD  azaTHIOprine (IMURAN) 50 MG tablet Take 50 mg by mouth 2 (two) times daily.    Yes Historical Provider, MD  buPROPion (WELLBUTRIN SR) 150 MG 12 hr tablet Take 150 mg by mouth 2 (two) times daily.   Yes Historical Provider, MD  carvedilol (  COREG) 3.125 MG tablet Take 3.125 mg by mouth 2 (two) times daily with a meal.   Yes Historical Provider, MD  cephALEXin (KEFLEX) 500 MG capsule Take 1 capsule (500 mg total) by mouth 4 (four) times daily. 02/18/13 02/25/13 Yes Sena Hitch, MD  cetirizine (ZYRTEC) 10 MG tablet Take 10 mg by mouth daily.   Yes Historical Provider, MD  citalopram (CELEXA) 20 MG tablet Take 20 mg by mouth every evening.   Yes Historical Provider, MD  clopidogrel (PLAVIX) 75 MG tablet Take 1 tablet (75 mg total) by mouth daily with breakfast. 10/12/12  Yes Estela Isaiah Blakes, MD  cycloSPORINE (RESTASIS) 0.05 % ophthalmic emulsion Place 1 drop  into both eyes 3 (three) times daily.   Yes Historical Provider, MD  etonogestrel-ethinyl estradiol (NUVARING) 0.12-0.015 MG/24HR vaginal ring Place 1 each vaginally every 28 (twenty-eight) days. Insert vaginally and leave in place for 3 consecutive weeks, then remove for 1 week.   Yes Historical Provider, MD  famotidine (PEPCID) 20 MG tablet Take 20 mg by mouth 2 (two) times daily.   Yes Historical Provider, MD  gabapentin (NEURONTIN) 300 MG capsule Take 300 mg by mouth 2 (two) times daily.    Yes Historical Provider, MD  hydroxychloroquine (PLAQUENIL) 200 MG tablet Take 200 mg by mouth 2 (two) times daily.    Yes Historical Provider, MD  insulin regular human CONCENTRATED (HUMULIN R) 500 UNIT/ML SOLN injection Inject 150-175 Units into the skin 3 (three) times daily. 3x a day (just before each meal) 175units in the morning-150 units with lunch-125 units at dinner 02/22/12  Yes Romero Belling, MD  isosorbide-hydrALAZINE (BIDIL) 20-37.5 MG per tablet Take 1 tablet by mouth 3 (three) times daily.   Yes Historical Provider, MD  losartan (COZAAR) 100 MG tablet Take 50 mg by mouth daily.    Yes Historical Provider, MD  metoCLOPramide (REGLAN) 10 MG tablet Take 10 mg by mouth 4 (four) times daily as needed (nausea).   Yes Historical Provider, MD  potassium chloride SA (K-DUR,KLOR-CON) 20 MEQ tablet Take 20 mEq by mouth 3 (three) times daily.    Yes Historical Provider, MD  pravastatin (PRAVACHOL) 10 MG tablet Take 10 mg by mouth at bedtime.     Yes Historical Provider, MD  promethazine (PHENERGAN) 12.5 MG tablet Take 12.5 mg by mouth every 6 (six) hours as needed for nausea.   Yes Historical Provider, MD  torsemide (DEMADEX) 10 MG tablet Take 10 mg by mouth 3 (three) times daily.    Yes Historical Provider, MD  traZODone (DESYREL) 100 MG tablet Take 100 mg by mouth at bedtime.   Yes Historical Provider, MD  INSULIN SYRINGE 1CC/29G (B-D INSULIN SYRINGE) 29G X 1/2" 1 ML MISC Use as directed three times daily.  Diag. Code 250.0 07/10/12   Romero Belling, MD   Physical Exam: Filed Vitals:   02/20/13 1203 02/20/13 1230 02/20/13 1405 02/20/13 1507  BP: 122/84 126/69 113/61 125/76  Pulse: 79 78 80 85  Temp: 98.4 F (36.9 C)   98.6 F (37 C)  TempSrc: Oral   Oral  Resp: 16 16 18 18   Height:    5\' 8"  (1.727 m)  Weight:    177.6 kg (391 lb 8.6 oz)  SpO2: 100% 99% 100% 100%     General:  Awake, in nad, morbidly obese  Eyes: PERRL  ENT: membranes dry, dentition fair  Neck: trachea midline, neck supple  Cardiovascular: regular, s1, s2  Respiratory: normal resp effort, no wheezing  Abdomen: soft, morbidly obese tender over upper quadrants w/o rebound  Skin: normal skin turgor, no abnormal skin lesions seen  Musculoskeletal: perfused, no clubbing or cyanosis  Psychiatric: mood and affect normal, no auditory/visual hallucinations  Neurologic: cn2-12 grossly in tact, strength and sensation in tact  Labs on Admission:  Basic Metabolic Panel:  Recent Labs Lab 02/18/13 1806 02/20/13 0443 02/20/13 1431  NA 139 142  --   K 3.5 3.7  --   CL 102 106  --   CO2 23 24  --   GLUCOSE 179* 160*  --   BUN 10 9  --   CREATININE 0.91 0.93 1.08  CALCIUM 9.7 9.4  --    Liver Function Tests:  Recent Labs Lab 02/18/13 1806 02/20/13 0443  AST 15 24  ALT 7 17  ALKPHOS 92 82  BILITOT 0.3 0.3  PROT 7.4 6.9  ALBUMIN 3.1* 3.0*    Recent Labs Lab 02/18/13 1806 02/20/13 0443  LIPASE 24 22   No results found for this basename: AMMONIA,  in the last 168 hours CBC:  Recent Labs Lab 02/18/13 1806 02/20/13 0443 02/20/13 1431  WBC 11.5* 10.7* 9.9  NEUTROABS  --  6.8  --   HGB 10.8* 10.5* 10.8*  HCT 31.9* 31.6* 32.7*  MCV 81.8 82.5 83.0  PLT 367 343 346   Cardiac Enzymes:  Recent Labs Lab 02/20/13 0443  TROPONINI <0.30    BNP (last 3 results)  Recent Labs  10/09/12 1704 02/18/13 1806 02/20/13 0443  PROBNP 73.4 487.5* 397.9*   CBG: No results found for this  basename: GLUCAP,  in the last 168 hours  Radiological Exams on Admission: Ct Abdomen Pelvis W Wo Contrast  02/20/2013   *RADIOLOGY REPORT*  Clinical Data: Chest pain and upper abdominal pain with nausea and vomiting.  Shortness of breath.  CT ABDOMEN AND PELVIS WITHOUT AND WITH CONTRAST  Technique:  Multidetector CT imaging of the abdomen and pelvis was performed without contrast material in one or both body regions, followed by contrast material(s) and further sections in one or both body regions.  Contrast: OMNIPAQUE IOHEXOL 300 MG/ML  SOLN  Comparison: Ultrasound abdomen 02/20/2013.  Findings: Lung bases show scattered subsegmental atelectasis. Heart is enlarged.  No pericardial or pleural effusion.  Although the exam was intended to have arterial and portal venous phase imaging, an IV tubing malfunction occurred during contrast injection and the postcontrast images are both in the delayed nephrographic phase.  There is a heterogeneous mass in the right hepatic lobe, measuring approximately 5.9 x 7.7 cm in greatest transaxial dimension.  It bulges the liver contour slightly.  Liver measures approximately 22.6 cm.  Gallbladder, adrenal glands, kidneys, spleen, pancreas, stomach and bowel are unremarkable.  Uterus and ovaries are visualized.  No pathologically enlarged lymph nodes.  No worrisome lytic or sclerotic lesions.  IMPRESSION:  1.  Right hepatic lobe mass is poorly characterized on today's exam, for reasons given above.  Differential diagnosis is broad and includes abscess, hemangioma and neoplasm.  MR abdomen without and with contrast (preferably with Eovist) is recommended. 2.  Hepatomegaly.   Original Report Authenticated By: Leanna Battles, M.D.   Dg Chest 2 View  02/18/2013   *RADIOLOGY REPORT*  Clinical Data: Shortness of breath  CHEST - 2 VIEW  Comparison:  October 09, 2012  Findings:  Lungs are clear.  Heart is mildly enlarged with normal pulmonary vascularity.  No adenopathy.  No bone  lesions.  IMPRESSION: No edema or  consolidation.  Stable mild cardiac enlargement.   Original Report Authenticated By: Bretta Bang, M.D.   US Abdomen Complete  02/20/2013   *RADIOLOGY REPORT*  Abdominal ultrasound  History:  Abdominal pain  Findings:  Gallbladder is visualized in multiple projections. There are no gallstones, gallbladder wall thickening, or pericholecystic fluid collection.  There is no intrahepatic, common hepatic, or common bile duct dilatation.  Pancreas appears normal.  In the left lobe of the liver, there is a mass-like area which is slightly hyperechoic compared the remainder of the liver measuring 7.6 x 5.2 x 7.8 cm in size.  No other focal liver lesions are identified.  Spleen is normal in size and homogeneous in echotexture.  Kidneys bilaterally appear within normal limits.  There is no ascites. Aorta is nonaneurysmal.  Inferior vena cava appears patent.  Conclusion:  Mass-like area in the left lobe of the liver.  This finding warrants further evaluation.  MRI with and without intravenous contrast would be the imaging study of choice to further assess.  Study otherwise unremarkable.   Original Report Authenticated By: Bretta Bang, M.D.   Dg Chest Portable 1 View  02/20/2013   *RADIOLOGY REPORT*  Clinical Data: Chest pain, shortness of breath  PORTABLE CHEST - 1 VIEW  Comparison: Prior radiograph from 02/18/2013  Findings: Cardiomegaly is likely unchanged allowing for differences in technique.  Mediastinal silhouette is stable.  Current examination has been performed with a similar degree of lung inflation. No frank pulmonary edema is identified.  No pleural effusion or airspace consolidation.  No pneumothorax.  Osseous structures are unchanged.  IMPRESSION: Stable cardiomegaly without frank pulmonary edema or airspace consolidation.   Original Report Authenticated By: Rise Mu, M.D.    Assessment/Plan Active Problems:   Bell's palsy   SLE   SJOGREN'S  SYNDROME   Shortness of breath   DM (diabetes mellitus)   HTN (hypertension)   Systolic CHF, acute on chronic   Morbid obesity   Hepatic abscess vs hemangioma vs neoplasm: - See [place] and CT results - Of interest, pt has been taking estrogen replacement x 3 years - Case discussed with IR who requests dedicated MRI abd - Initially, pt's weight precluded an MRI earlier. However, given request by IR, will order abd MRI w/wo contrast - Admit to the floor - Depending on MRI results, may consider IR guided drainage/biopsy  DM: - Will continue home meds - Will add SSI  HTN: - BP stable and controlled  SLE: - On prednisone. Will continue  Sjogren's syndrome: - On steroids as above. Will continue  Morbid obesity: - Stable  Code Status: Full (must indicate code status--if unknown or must be presumed, indicate so) Family Communication: Pt in room (indicate person spoken with, if applicable, with phone number if by telephone) Disposition Plan: Pending (indicate anticipated LOS)  Time spent:  Loukas Antonson K [facility] Pager [phone]  If 7PM-7AM, please contact night-coverage [email or web address] Password [facility] 02/20/2013, 3:32 PM

## 2013-02-20 NOTE — Progress Notes (Signed)
PHARMACIST - PHYSICIAN COMMUNICATION  CONCERNING: P&T Medication Policy Regarding Oral Bisphosphonates  RECOMMENDATION: Your order for alendronate (Fosamax), ibandronate (Boniva), or risedronate (Actonel) has been discontinued at this time.  If the patient's post-hospital medical condition warrants safe use of this class of drugs, please resume the pre-hospital regimen upon discharge.  DESCRIPTION:  Alendronate (Fosamax), ibandronate (Boniva), and risedronate (Actonel) can cause severe esophageal erosions in patients who are unable to remain upright at least 30 minutes after taking this medication.   Since brief interruptions in therapy are thought to have minimal impact on bone mineral density, the Pharmacy & Therapeutics Committee has established that bisphosphonate orders should be routinely discontinued during hospitalization.   To override this safety policy and permit administration of Boniva, Fosamax, or Actonel in the hospital, prescribers must write "DO NOT HOLD" in the comments section when placing the order for this class of medications.   

## 2013-02-21 ENCOUNTER — Other Ambulatory Visit: Payer: Self-pay | Admitting: Radiology

## 2013-02-21 DIAGNOSIS — R109 Unspecified abdominal pain: Secondary | ICD-10-CM

## 2013-02-21 DIAGNOSIS — E119 Type 2 diabetes mellitus without complications: Secondary | ICD-10-CM

## 2013-02-21 DIAGNOSIS — K769 Liver disease, unspecified: Secondary | ICD-10-CM

## 2013-02-21 DIAGNOSIS — D649 Anemia, unspecified: Secondary | ICD-10-CM

## 2013-02-21 LAB — URINE CULTURE
Colony Count: NO GROWTH
Culture: NO GROWTH

## 2013-02-21 LAB — COMPREHENSIVE METABOLIC PANEL
BUN: 8 mg/dL (ref 6–23)
CO2: 27 mEq/L (ref 19–32)
Calcium: 8.8 mg/dL (ref 8.4–10.5)
Creatinine, Ser: 1.3 mg/dL — ABNORMAL HIGH (ref 0.50–1.10)
Glucose, Bld: 134 mg/dL — ABNORMAL HIGH (ref 70–99)
Total Protein: 6.7 g/dL (ref 6.0–8.3)

## 2013-02-21 LAB — CBC
HCT: 31.2 % — ABNORMAL LOW (ref 36.0–46.0)
MCH: 27 pg (ref 26.0–34.0)
MCV: 84.1 fL (ref 78.0–100.0)
Platelets: 312 10*3/uL (ref 150–400)
RDW: 15.6 % — ABNORMAL HIGH (ref 11.5–15.5)

## 2013-02-21 LAB — GLUCOSE, CAPILLARY
Glucose-Capillary: 124 mg/dL — ABNORMAL HIGH (ref 70–99)
Glucose-Capillary: 123 mg/dL — ABNORMAL HIGH (ref 70–99)

## 2013-02-21 MED ORDER — DEXTROSE 5 % IV SOLN
1.0000 g | INTRAVENOUS | Status: DC
  Administered 2013-02-21 – 2013-02-22 (×2): 1 g via INTRAVENOUS
  Filled 2013-02-21 (×2): qty 10

## 2013-02-21 NOTE — Progress Notes (Signed)
Addendum  Patient seen and examined, chart and data base reviewed.  I agree with the above assessment and plan.  For full details please see Mrs. Algis Downs PA note.  Hepatic lesion abscess versus hemangioma versus neoplasm.   MRI cannot be done in the hospital, treat conservatively, if patient can tolerate diet can do MRI as outpatient.   Clint Lipps, MD [facility] Pager: [phone] 02/21/2013, 5:09 PM

## 2013-02-21 NOTE — Progress Notes (Signed)
Utilization review completed. Yoshika Vensel, RN, BSN. 

## 2013-02-21 NOTE — Progress Notes (Addendum)
Inpatient Diabetes Program Recommendations Referral received regarding pt using U500 at home and in hospial. Inpatient Diabetes Program Recommendations Correction (SSI): While NPO, please change corection to q 4 hrs.  appears to be effective While NPO. Advise pt to bring U-500 from home or have pharmacy draw up per policy. These doses are listed as given 3 times daily with meals only. The units calculated in U-500 are 5 times the strength of U-500,.  If pt not eating or having nausea and vomitting,, would continue the correction dose as ordered..unless pt brought hers from home. Will revisit tomorrow.Best practice would be to notify the MD who handles her diabetes on U-500  Thank you, Lenor Coffin, RN, CNS, Diabetes Coordinator [phone])

## 2013-02-21 NOTE — Progress Notes (Signed)
[facility] PROGRESS NOTE  [name] ZOX:[id number] DOB: Sep 08, 1970 DOA: 02/20/2013 PCP: Ron Parker, MD  Assessment/Plan:  Hepatic abscess vs hemangioma vs neoplasm:  - 7.6x5.2x7.8cm liver mass on [place] and a 5.9x7.7cm lesion seen on a follow up CT - On estrogen - Vaginal Nuva ring x 3 years.  Requested patient remove nuva ring. - Case discussed with IR who requests dedicated MRI abd  - Pt's weight precludes an MRI at [facility].   - Have scheduled OUTPATIENT MRI at [facility] for 11:15 on Sunday 7/27 (Results to be sent to Dr. Della Goo, PCP) - Depending on MRI results, may consider IR guided drainage/biopsy   Vomiting -Has been vomiting x 3 days -likely secondary to Hepatic mass -supportive care.  (Zofran, Clears - once she is allowed to eat)  UTI - on keflex out patient  - will start rocephin in patient - urine culture pending.  DM:  - Will continue home meds on U500 - Diabetes coordinator consultation.  HTN:  - BP stable and controlled  - Bidil - lasix -Coreg  SLE:  - On prednisone. Will continue home medications - Imuran, plaquenil - Follows with Dr. Lanell Matar at [facility]   Sjogren's syndrome:  - On steroids as above. Will continue   Morbid obesity:  - Stable -Nutrition consultation  Anemia -Appears Chronic -guiac stool  Anxiety -Controlled - Wellbutrin,, Xanax, celexa   DVT Prophylaxis:  heparin  Code Status: full Family Communication:  Disposition Plan: inpatient   Consultants:  Interventional radiology  Procedures:    Antibiotics:  rocephin  HPI/Subjective: Patient describes vomiting for several days.  Objective: Filed Vitals:   02/20/13 1507 02/20/13 2107 02/21/13 0502 02/21/13 0532  BP: 125/76 160/78 161/77 152/78  Pulse: 85 88 88 79  Temp: 98.6 F (37 C) 98.6 F (37 C) 99.2 F (37.3 C)   TempSrc: Oral Oral Oral   Resp: 18 20 18    Height: 5\' 8"  (1.727 m)     Weight: 177.6 kg (391 lb 8.6 oz)     SpO2:  100% 99% 98%     Intake/Output Summary (Last 24 hours) at 02/21/13 1522 Last data filed at 02/21/13 0827  Gross per 24 hour  Intake 1476.25 ml  Output    550 ml  Net 926.25 ml   Filed Weights   02/20/13 1507  Weight: 177.6 kg (391 lb 8.6 oz)    Exam:   General:  A&O, NAD, Lying comfortably in bed, morbidly obese  Cardiovascular: RRR, no murmurs, rubs or gallops, no lower extremity edema  Respiratory: CTA, no wheeze, crackles, or rales.  No increased work of breathing.  Abdomen: Soft, non-tender, non-distended, + bowel sounds, no masses  Musculoskeletal: Able to move all 4 extremities, 5/5 strength in each  Data Reviewed: Basic Metabolic Panel:  Recent Labs Lab 02/18/13 1806 02/20/13 0443 02/20/13 1431 02/21/13 0550  NA 139 142  --  143  K 3.5 3.7  --  3.7  CL 102 106  --  107  CO2 23 24  --  27  GLUCOSE 179* 160*  --  134*  BUN 10 9  --  8  CREATININE 0.91 0.93 1.08 1.30*  CALCIUM 9.7 9.4  --  8.8   Liver Function Tests:  Recent Labs Lab 02/18/13 1806 02/20/13 0443 02/21/13 0550  AST 15 24 25   ALT 7 17 24   ALKPHOS 92 82 80  BILITOT 0.3 0.3 0.4  PROT 7.4 6.9 6.7  ALBUMIN 3.1* 3.0* 2.9*  Recent Labs Lab 02/18/13 1806 02/20/13 0443  LIPASE 24 22   CBC:  Recent Labs Lab 02/18/13 1806 02/20/13 0443 02/20/13 1431 02/21/13 0550  WBC 11.5* 10.7* 9.9 9.2  NEUTROABS  --  6.8  --   --   HGB 10.8* 10.5* 10.8* 10.0*  HCT 31.9* 31.6* 32.7* 31.2*  MCV 81.8 82.5 83.0 84.1  PLT 367 343 346 312   Cardiac Enzymes:  Recent Labs Lab 02/20/13 0443  TROPONINI <0.30   BNP (last 3 results)  Recent Labs  10/09/12 1704 02/18/13 1806 02/20/13 0443  PROBNP 73.4 487.5* 397.9*   CBG:  Recent Labs Lab 02/20/13 1722 02/20/13 2203 02/21/13 0740 02/21/13 1214  GLUCAP 162* 142* 130* 132*    Recent Results (from the past 240 hour(s))  URINE CULTURE     Status: None   Collection Time    02/18/13  7:27 PM      Result Value Range Status    Specimen Description URINE, CLEAN CATCH   Final   Special Requests NONE   Final   Culture  Setup Time 02/18/2013 21:03   Final   Colony Count NO GROWTH   Final   Culture NO GROWTH   Final   Report Status 02/20/2013 FINAL   Final     Studies: Ct Abdomen Pelvis W Wo Contrast  02/20/2013   *RADIOLOGY REPORT*  Clinical Data: Chest pain and upper abdominal pain with nausea and vomiting.  Shortness of breath.  CT ABDOMEN AND PELVIS WITHOUT AND WITH CONTRAST  Technique:  Multidetector CT imaging of the abdomen and pelvis was performed without contrast material in one or both body regions, followed by contrast material(s) and further sections in one or both body regions.  Contrast: OMNIPAQUE IOHEXOL 300 MG/ML  SOLN  Comparison: Ultrasound abdomen 02/20/2013.  Findings: Lung bases show scattered subsegmental atelectasis. Heart is enlarged.  No pericardial or pleural effusion.  Although the exam was intended to have arterial and portal venous phase imaging, an IV tubing malfunction occurred during contrast injection and the postcontrast images are both in the delayed nephrographic phase.  There is a heterogeneous mass in the right hepatic lobe, measuring approximately 5.9 x 7.7 cm in greatest transaxial dimension.  It bulges the liver contour slightly.  Liver measures approximately 22.6 cm.  Gallbladder, adrenal glands, kidneys, spleen, pancreas, stomach and bowel are unremarkable.  Uterus and ovaries are visualized.  No pathologically enlarged lymph nodes.  No worrisome lytic or sclerotic lesions.  IMPRESSION:  1.  Right hepatic lobe mass is poorly characterized on today's exam, for reasons given above.  Differential diagnosis is broad and includes abscess, hemangioma and neoplasm.  MR abdomen without and with contrast (preferably with Eovist) is recommended. 2.  Hepatomegaly.   Original Report Authenticated By: Leanna Battles, M.D.   US Abdomen Complete  02/20/2013   *RADIOLOGY REPORT*  Abdominal  ultrasound  History:  Abdominal pain  Findings:  Gallbladder is visualized in multiple projections. There are no gallstones, gallbladder wall thickening, or pericholecystic fluid collection.  There is no intrahepatic, common hepatic, or common bile duct dilatation.  Pancreas appears normal.  In the left lobe of the liver, there is a mass-like area which is slightly hyperechoic compared the remainder of the liver measuring 7.6 x 5.2 x 7.8 cm in size.  No other focal liver lesions are identified.  Spleen is normal in size and homogeneous in echotexture.  Kidneys bilaterally appear within normal limits.  There is no ascites. Aorta is nonaneurysmal.  Inferior vena cava appears patent.  Conclusion:  Mass-like area in the left lobe of the liver.  This finding warrants further evaluation.  MRI with and without intravenous contrast would be the imaging study of choice to further assess.  Study otherwise unremarkable.   Original Report Authenticated By: Bretta Bang, M.D.   Dg Chest Portable 1 View  02/20/2013   *RADIOLOGY REPORT*  Clinical Data: Chest pain, shortness of breath  PORTABLE CHEST - 1 VIEW  Comparison: Prior radiograph from 02/18/2013  Findings: Cardiomegaly is likely unchanged allowing for differences in technique.  Mediastinal silhouette is stable.  Current examination has been performed with a similar degree of lung inflation. No frank pulmonary edema is identified.  No pleural effusion or airspace consolidation.  No pneumothorax.  Osseous structures are unchanged.  IMPRESSION: Stable cardiomegaly without frank pulmonary edema or airspace consolidation.   Original Report Authenticated By: Rise Mu, M.D.    Scheduled Meds: . ALPRAZolam  0.5 mg Oral QHS  . antiseptic oral rinse  15 mL Mouth Rinse BID  . aspirin EC  81 mg Oral Daily  . azaTHIOprine  50 mg Oral BID  . buPROPion  150 mg Oral BID  . carvedilol  3.125 mg Oral BID WC  . cefTRIAXone (ROCEPHIN)  IV  1 g Intravenous Q24H  .  citalopram  20 mg Oral QPM  . cycloSPORINE  1 drop Both Eyes TID  . docusate sodium  100 mg Oral BID  . famotidine  20 mg Oral BID  . gabapentin  300 mg Oral BID  . heparin  5,000 Units Subcutaneous Q8H  . hydroxychloroquine  200 mg Oral BID  . insulin aspart  0-15 Units Subcutaneous TID WC  . insulin aspart  0-5 Units Subcutaneous QHS  . insulin regular human CONCENTRATED  125 Units Subcutaneous Q supper  . insulin regular human CONCENTRATED  150 Units Subcutaneous Q lunch  . insulin regular human CONCENTRATED  175 Units Subcutaneous Q breakfast  . isosorbide-hydrALAZINE  1 tablet Oral TID  . loratadine  10 mg Oral Daily  . losartan  50 mg Oral Daily  . metoCLOPramide (REGLAN) injection  10 mg Intravenous Once  . potassium chloride SA  20 mEq Oral TID  . simvastatin  10 mg Oral q1800  . torsemide  10 mg Oral TID  . traZODone  100 mg Oral QHS   Continuous Infusions: . sodium chloride 75 mL/hr at 02/21/13 0533    Active Problems:   Bell's palsy   SLE   SJOGREN'S SYNDROME   Shortness of breath   DM (diabetes mellitus)   HTN (hypertension)   Systolic CHF, acute on chronic   Morbid obesity    Conley Canal [facility] Pager [phone]. If 7PM-7AM, please contact night-coverage at [email or web address], password [email or web address] 02/21/2013, 3:22 PM  LOS: 1 day

## 2013-02-21 NOTE — Progress Notes (Signed)
IR aware of request to eval hepatic lesion. Apparently MRI suggested but pt > wt limit for this facility. Reviewed imaging with Dr. Deanne Coffer. Lesion could represent mass, hemangioma, or poorly organized abscess, which based on CT images, would not be ready for percutaneous drainage at this point. IR is looking in to possibility of coordinating MRI at alternative facility to accommodate weight. MRI would provide better detail in workup of this hepatic lesion. Also recommend checking AFP, blood cultures. IR following.  Brayton El PA-C Interventional Radiology 02/21/2013 9:08 AM

## 2013-02-21 NOTE — Progress Notes (Signed)
PHARMACY - HUMULIN R U-500  Spoke with patient and clarified U-500 insulin dosing. Patient describes using a syringe that holds 100 units. She draws up 35 units for breakfast, 30 units with lunch and 25 units with dinner. However, she actually uses 175 units with breakfast, 150 units with lunch and 125 units with dinner since U-500 (500 units/ml) is a concentrated insulin.  [place], [address].D., BCPS Clinical Pharmacist Pager: [phone] 02/21/2013 10:06 AM

## 2013-02-22 DIAGNOSIS — R079 Chest pain, unspecified: Secondary | ICD-10-CM

## 2013-02-22 DIAGNOSIS — J189 Pneumonia, unspecified organism: Secondary | ICD-10-CM

## 2013-02-22 LAB — CBC
HCT: 31.3 % — ABNORMAL LOW (ref 36.0–46.0)
MCH: 27.5 pg (ref 26.0–34.0)
MCHC: 33.2 g/dL (ref 30.0–36.0)
MCV: 82.8 fL (ref 78.0–100.0)
RDW: 15.6 % — ABNORMAL HIGH (ref 11.5–15.5)

## 2013-02-22 LAB — COMPREHENSIVE METABOLIC PANEL
Albumin: 2.9 g/dL — ABNORMAL LOW (ref 3.5–5.2)
BUN: 7 mg/dL (ref 6–23)
Calcium: 8.7 mg/dL (ref 8.4–10.5)
Chloride: 103 mEq/L (ref 96–112)
Creatinine, Ser: 1.15 mg/dL — ABNORMAL HIGH (ref 0.50–1.10)
Total Bilirubin: 0.3 mg/dL (ref 0.3–1.2)
Total Protein: 6.6 g/dL (ref 6.0–8.3)

## 2013-02-22 LAB — GLUCOSE, CAPILLARY: Glucose-Capillary: 152 mg/dL — ABNORMAL HIGH (ref 70–99)

## 2013-02-22 MED ORDER — MAGNESIUM SULFATE 40 MG/ML IJ SOLN
2.0000 g | Freq: Once | INTRAMUSCULAR | Status: AC
  Administered 2013-02-22: 2 g via INTRAVENOUS
  Filled 2013-02-22: qty 50

## 2013-02-22 MED ORDER — ONDANSETRON HCL 4 MG PO TABS
4.0000 mg | ORAL_TABLET | Freq: Three times a day (TID) | ORAL | Status: DC
  Administered 2013-02-22 – 2013-02-23 (×5): 4 mg via ORAL
  Filled 2013-02-22 (×8): qty 1

## 2013-02-22 MED ORDER — INSULIN ASPART 100 UNIT/ML ~~LOC~~ SOLN
0.0000 [IU] | Freq: Three times a day (TID) | SUBCUTANEOUS | Status: DC
  Administered 2013-02-23: 3 [IU] via SUBCUTANEOUS
  Administered 2013-02-23: 7 [IU] via SUBCUTANEOUS

## 2013-02-22 MED ORDER — ONDANSETRON HCL 4 MG/2ML IJ SOLN: 4.0000 mg | Freq: Three times a day (TID) | INTRAMUSCULAR | Status: DC

## 2013-02-22 MED ORDER — INSULIN ASPART 100 UNIT/ML ~~LOC~~ SOLN
6.0000 [IU] | Freq: Three times a day (TID) | SUBCUTANEOUS | Status: DC
  Administered 2013-02-23 (×2): 6 [IU] via SUBCUTANEOUS

## 2013-02-22 NOTE — Progress Notes (Addendum)
PATIENT DETAILS Name: [name] Age: 42 y.o. Sex: female Date of Birth: 1971-04-23 Admit Date: 02/20/2013 Admitting Physician Jerald Kief, MD ZOX:[id number] C, MD  Subjective: She complains of pain below her rib cage bilaterally.  She also complained of some bilateral lower quadrant pain.  She has been nauseous this morning and has been gagging but denies vomiting.   She ate most of her meal this AM.  Assessment/Plan: Hepatic abscess vs hemangioma vs neoplasm:  - 7.6x5.2x7.8cm liver mass on [place] and a 5.9x7.7cm lesion seen on a follow up CT  - On estrogen - Vaginal Nuva ring x 3 years. Requested patient remove nuva ring.  - Case discussed with IR who requests dedicated MRI abd  - Pt's weight precludes an MRI at [facility].  - Have scheduled OUTPATIENT MRI at [facility] for 11:15 on Sunday 7/27 (Results to be sent to Dr. Della Goo, PCP)  - Depending on MRI results, may consider IR guided drainage/biopsy   Vomiting  -Has been vomiting x 3 days  -supportive care.   UTI  -Sterile pyuria -Urine culture negative -Will discontinue Rocephin  DM:  - Patient reports she takes U500 at home - CBGs in house have remained reasonable - despite not receiving U500. - Suspect patient may not need U500 at this point.  Will utilize SSI - Resistant with meal coverage. - Diabetes coordinator consulted.  Hypokalemia -has been receiving kdur 20 meq TID since admission yet K is 3.4 -Will give Mag IV and recheck bmet in am 7/26  HTN:  - BP stable and controlled  - Bidil  - lasix  -Coreg   SLE:  - Stable - On prednisone, Imuran, plaquenil  - Follows with Dr. Lanell Matar at [facility]   Sjogren's syndrome:  - On steroids as above. Will continue   Morbid obesity:  - Stable  -Nutrition consultation   Anemia  -Appears Chronic  -guiac stool.  Note that in 2011 patient had guiac + stool.   -She has not had a colonoscopy -Stable for outpatient follow up.  Anxiety  -Controlled   -Wellbutrin,, Xanax, celexa   Disposition: Remain inpatient until she is better able to tolerate a diet.  DVT Prophylaxis: Heparin  Code Status: Full code  Family Communication None at bedside  Procedures:  None  CONSULTS:  None   MEDICATIONS: Scheduled Meds: . ALPRAZolam  0.5 mg Oral QHS  . antiseptic oral rinse  15 mL Mouth Rinse BID  . aspirin EC  81 mg Oral Daily  . azaTHIOprine  50 mg Oral BID  . buPROPion  150 mg Oral BID  . carvedilol  3.125 mg Oral BID WC  . citalopram  20 mg Oral QPM  . cycloSPORINE  1 drop Both Eyes TID  . docusate sodium  100 mg Oral BID  . famotidine  20 mg Oral BID  . gabapentin  300 mg Oral BID  . heparin  5,000 Units Subcutaneous Q8H  . hydroxychloroquine  200 mg Oral BID  . insulin aspart  0-20 Units Subcutaneous TID WC  . insulin aspart  6 Units Subcutaneous TID WC  . isosorbide-hydrALAZINE  1 tablet Oral TID  . loratadine  10 mg Oral Daily  . losartan  50 mg Oral Daily  . magnesium sulfate 1 - 4 g bolus IVPB  2 g Intravenous Once  . metoCLOPramide (REGLAN) injection  10 mg Intravenous Once  . ondansetron (ZOFRAN) IV  4 mg Intravenous TID AC & HS   Or  . ondansetron  4 mg Oral TID AC & HS  . potassium chloride SA  20 mEq Oral TID  . simvastatin  10 mg Oral q1800  . torsemide  10 mg Oral TID  . traZODone  100 mg Oral QHS   Continuous Infusions: . sodium chloride 1,000 mL (02/22/13 1205)   PRN Meds:.albuterol, alum & mag hydroxide-simeth, metoCLOPramide, morphine injection, polyethylene glycol, promethazine, sorbitol  Antibiotics: Anti-infectives   Start     Dose/Rate Route Frequency Ordered Stop   02/21/13 0800  cefTRIAXone (ROCEPHIN) 1 g in dextrose 5 % 50 mL IVPB  Status:  Discontinued     1 g 100 mL/hr over 30 Minutes Intravenous Every 24 hours 02/21/13 0725 02/22/13 1309   02/20/13 1630  hydroxychloroquine (PLAQUENIL) tablet 200 mg     200 mg Oral 2 times daily 02/20/13 1448     02/20/13 1500  cephALEXin (KEFLEX)  capsule 500 mg  Status:  Discontinued     500 mg Oral 4 times daily 02/20/13 1448 02/21/13 0725       PHYSICAL EXAM: Vital signs in last 24 hours: Filed Vitals:   02/21/13 2147 02/22/13 0711 02/22/13 0854 02/22/13 1333  BP: 120/73 137/84 117/68 109/70  Pulse: 86 95  93  Temp: 99.3 F (37.4 C) 99.1 F (37.3 C)  99 F (37.2 C)  TempSrc: Oral Oral  Oral  Resp: 18 16  18   Height:      Weight:      SpO2: 99% 99%  99%    Weight change:  Filed Weights   02/20/13 1507  Weight: 177.6 kg (391 lb 8.6 oz)   Body mass index is 59.55 kg/(m^2).   Gen Exam: Awake and alert with clear speech. Morbidly obese.  NAD. Neck: Supple, No JVD. Chest: B/L Clear. No w/c/r CVS: S1 S2 Regular, no murmurs. Abdomen: soft, obese. BS +, non tender, non distended. Extremities: no edema, lower extremities warm to touch. Neurologic: Non Focal. Skin: No Rash. Wounds: N/A.  Intake/Output from previous day:  Intake/Output Summary (Last 24 hours) at 02/22/13 1338 Last data filed at 02/22/13 0857  Gross per 24 hour  Intake 2251.25 ml  Output      0 ml  Net 2251.25 ml     LAB RESULTS: CBC  Recent Labs Lab 02/18/13 1806 02/20/13 0443 02/20/13 1431 02/21/13 0550 02/22/13 0638  WBC 11.5* 10.7* 9.9 9.2 9.2  HGB 10.8* 10.5* 10.8* 10.0* 10.4*  HCT 31.9* 31.6* 32.7* 31.2* 31.3*  PLT 367 343 346 312 291  MCV 81.8 82.5 83.0 84.1 82.8  MCH 27.7 27.4 27.4 27.0 27.5  MCHC 33.9 33.2 33.0 32.1 33.2  RDW 15.1 15.3 15.2 15.6* 15.6*  LYMPHSABS  --  2.9  --   --   --   MONOABS  --  0.8  --   --   --   EOSABS  --  0.1  --   --   --   BASOSABS  --  0.0  --   --   --     Chemistries   Recent Labs Lab 02/18/13 1806 02/20/13 0443 02/20/13 1431 02/21/13 0550 02/22/13 0638  NA 139 142  --  143 139  K 3.5 3.7  --  3.7 3.4*  CL 102 106  --  107 103  CO2 23 24  --  27 23  GLUCOSE 179* 160*  --  134* 136*  BUN 10 9  --  8 7  CREATININE 0.91 0.93 1.08 1.30* 1.15*  CALCIUM  9.7 9.4  --  8.8 8.7     CBG:  Recent Labs Lab 02/21/13 1214 02/21/13 1745 02/21/13 2146 02/22/13 0806 02/22/13 1150  GLUCAP 132* 124* 123* 152* 149*    Coagulation profile  Recent Labs Lab 02/20/13 1749  INR 1.06    Cardiac Enzymes  Recent Labs Lab 02/20/13 0443  TROPONINI <0.30    Recent Labs  02/20/13 0443  LIPASE 22    MICROBIOLOGY: Recent Results (from the past 240 hour(s))  URINE CULTURE     Status: None   Collection Time    02/18/13  7:27 PM      Result Value Range Status   Specimen Description URINE, CLEAN CATCH   Final   Special Requests NONE   Final   Culture  Setup Time 02/18/2013 21:03   Final   Colony Count NO GROWTH   Final   Culture NO GROWTH   Final   Report Status 02/20/2013 FINAL   Final  URINE CULTURE     Status: None   Collection Time    02/20/13  8:01 PM      Result Value Range Status   Specimen Description URINE, RANDOM   Final   Special Requests NONE   Final   Culture  Setup Time 02/21/2013 02:32   Final   Colony Count NO GROWTH   Final   Culture NO GROWTH   Final   Report Status 02/21/2013 FINAL   Final    RADIOLOGY STUDIES/RESULTS: Ct Abdomen Pelvis W Wo Contrast  02/20/2013   *RADIOLOGY REPORT*  Clinical Data: Chest pain and upper abdominal pain with nausea and vomiting.  Shortness of breath.  CT ABDOMEN AND PELVIS WITHOUT AND WITH CONTRAST  Technique:  Multidetector CT imaging of the abdomen and pelvis was performed without contrast material in one or both body regions, followed by contrast material(s) and further sections in one or both body regions.  Contrast: OMNIPAQUE IOHEXOL 300 MG/ML  SOLN  Comparison: Ultrasound abdomen 02/20/2013.  Findings: Lung bases show scattered subsegmental atelectasis. Heart is enlarged.  No pericardial or pleural effusion.  Although the exam was intended to have arterial and portal venous phase imaging, an IV tubing malfunction occurred during contrast injection and the postcontrast images are both in the  delayed nephrographic phase.  There is a heterogeneous mass in the right hepatic lobe, measuring approximately 5.9 x 7.7 cm in greatest transaxial dimension.  It bulges the liver contour slightly.  Liver measures approximately 22.6 cm.  Gallbladder, adrenal glands, kidneys, spleen, pancreas, stomach and bowel are unremarkable.  Uterus and ovaries are visualized.  No pathologically enlarged lymph nodes.  No worrisome lytic or sclerotic lesions.  IMPRESSION:  1.  Right hepatic lobe mass is poorly characterized on today's exam, for reasons given above.  Differential diagnosis is broad and includes abscess, hemangioma and neoplasm.  MR abdomen without and with contrast (preferably with Eovist) is recommended. 2.  Hepatomegaly.   Original Report Authenticated By: Leanna Battles, M.D.   Dg Chest 2 View  02/18/2013   *RADIOLOGY REPORT*  Clinical Data: Shortness of breath  CHEST - 2 VIEW  Comparison:  October 09, 2012  Findings:  Lungs are clear.  Heart is mildly enlarged with normal pulmonary vascularity.  No adenopathy.  No bone lesions.  IMPRESSION: No edema or consolidation.  Stable mild cardiac enlargement.   Original Report Authenticated By: Bretta Bang, M.D.   US Abdomen Complete  02/20/2013   *RADIOLOGY REPORT*  Abdominal ultrasound  History:  Abdominal pain  Findings:  Gallbladder is visualized in multiple projections. There are no gallstones, gallbladder wall thickening, or pericholecystic fluid collection.  There is no intrahepatic, common hepatic, or common bile duct dilatation.  Pancreas appears normal.  In the left lobe of the liver, there is a mass-like area which is slightly hyperechoic compared the remainder of the liver measuring 7.6 x 5.2 x 7.8 cm in size.  No other focal liver lesions are identified.  Spleen is normal in size and homogeneous in echotexture.  Kidneys bilaterally appear within normal limits.  There is no ascites. Aorta is nonaneurysmal.  Inferior vena cava appears patent.   Conclusion:  Mass-like area in the left lobe of the liver.  This finding warrants further evaluation.  MRI with and without intravenous contrast would be the imaging study of choice to further assess.  Study otherwise unremarkable.   Original Report Authenticated By: Bretta Bang, M.D.   Dg Chest Portable 1 View  02/20/2013   *RADIOLOGY REPORT*  Clinical Data: Chest pain, shortness of breath  PORTABLE CHEST - 1 VIEW  Comparison: Prior radiograph from 02/18/2013  Findings: Cardiomegaly is likely unchanged allowing for differences in technique.  Mediastinal silhouette is stable.  Current examination has been performed with a similar degree of lung inflation. No frank pulmonary edema is identified.  No pleural effusion or airspace consolidation.  No pneumothorax.  Osseous structures are unchanged.  IMPRESSION: Stable cardiomegaly without frank pulmonary edema or airspace consolidation.   Original Report Authenticated By: Rise Mu, M.D.    Rema Fendt, PA-S Algis Downs PA-C [facility]   If 7PM-7AM, please contact night-coverage [email or web address] Password [email or web address] 02/22/2013, 1:38 PM   LOS: 2 days    Addendum  Patient seen and examined, chart and data base reviewed.  I agree with the above assessment and plan.  For full details please see Mrs. Algis Downs PA note.  Continues to have some nausea and vomiting, antibiotics discontinued because urine is sterile.   Clint Lipps, MD [facility] Pager: [phone] 02/22/2013, 7:21 PM

## 2013-02-23 DIAGNOSIS — G510 Bell's palsy: Secondary | ICD-10-CM

## 2013-02-23 LAB — BASIC METABOLIC PANEL
BUN: 6 mg/dL (ref 6–23)
Chloride: 103 mEq/L (ref 96–112)
Creatinine, Ser: 1.2 mg/dL — ABNORMAL HIGH (ref 0.50–1.10)
GFR calc Af Amer: 64 mL/min — ABNORMAL LOW (ref >90)
Glucose, Bld: 138 mg/dL — ABNORMAL HIGH (ref 70–99)
Potassium: 3.7 mEq/L (ref 3.5–5.1)

## 2013-02-23 LAB — GLUCOSE, CAPILLARY
Glucose-Capillary: 223 mg/dL — ABNORMAL HIGH (ref 70–99)
Glucose-Capillary: 146 mg/dL — ABNORMAL HIGH (ref 70–99)

## 2013-02-23 NOTE — Discharge Summary (Signed)
Physician Discharge Summary  [name] MVH:[id number] DOB: [birth year] DOA: 02/20/2013  PCP: Ron Parker, MD  Admit date: 02/20/2013 Discharge date: 02/23/2013  Time spent: *40* minutes  Recommendations for Outpatient Follow-up:  1. Followup with primary care physician within one week. 2. Followup an MRI to be done on 02/24/2013 (open MRI) done for liver lesion.  Discharge Diagnoses:  Active Problems:   Bell's palsy   SLE   SJOGREN'S SYNDROME   Shortness of breath   DM (diabetes mellitus)   HTN (hypertension)   Systolic CHF, acute on chronic   Morbid obesity   Discharge Condition: Stable  Diet recommendation: Carbohydrate modified diet  Filed Weights   02/20/13 1507  Weight: 177.6 kg (391 lb 8.6 oz)    History of present illness:  [name] is a 42 y.o. female  With a hx of lupus, Sjogren's syndrome, diabetes, and chronic systolic chf who presents to the ED with complaints of upper quadrant abd pain with nausea/vomiting x one week. The patient was seen one day ago in the ED for the same complaints and was found to have a UA suggestive of a UTI. The patient was subsequently discharged home with Keflex after receiving rocephin. The pt returns with continued symptoms. In the ED, the patient was found to have a 7.6x5.2x7.8cm liver mass on [place] and a 5.9x7.7cm lesion seen on a follow up CT. The hospitalist service was consulted for possible admission. On further questioning, pt reports taking estrogen replacment x 53yrs.  Hospital Course:   1. Nausea/vomiting: Unclear etiology, patient had nausea and vomiting for one week, thought initially to be secondary to UTI, she patient was getting Keflex as outpatient and when she came in to the hospital she also did have some sterile pyuria. She was started on Rocephin but the urine culture did not show any growth. Patient nausea and vomiting improved, she was tolerating clear liquids with diet advanced carefully to solid diet and she  was able to tolerate it. Patient will be discharged today. She does have an MRI scheduled tomorrow at [facility] radiology.   2. Hepatic lesion: For the nausea and vomiting patient was evaluated by CT scan of abdomen and pelvis which showed liver lesion, this was further evaluated by ultrasound but ultimately radiology recommended MRI for further evaluation. Differential diagnoses include hemangioma, abscess and neoplasm. I felt that the patient liver lesion might not be very related to her symptoms. An MRI was not done in the hospital because of patient habitus, the scheduled MRI as outpatient at [facility] radiology for an open MRI.  3. UTI: Thought initially to have UTI because a pyuria, she was on Rocephin. Culture did not show growth so antibiotics was discontinued.  4. Diabetes mellitus type 2: Has good tight glycemic control with hemoglobin A1c of 5.6, patient is getting concentrated regular insulin U 500. She is getting 175, 150 and 125 units with breakfast, lunch and dinner respectively. Patient surprisingly did not have very high blood sugar while she is in the hospital and while she is off of her massive doses of insulin. Her CBGs remained between 130 and 150 when she was on the clear liquids and full diet. Patient showed some resistance to changing her insulin regimen. She reported that her hemoglobin A1c was 11.2 last summer and it was down to 5.6 now because of the concentrated insulin, patient counseled extensively about the hypoglycemia and if patient follow up with to do.  5. SLE/Sjogren's syndrome: Patient is on Imuran and Plaquenil  as well as steroids. No new issues.  Procedures:  None  Consultations:  None  Discharge Exam: Filed Vitals:   02/22/13 1333 02/22/13 1649 02/22/13 2256 02/23/13 0553  BP: 109/70 114/74 148/78 139/89  Pulse: 93 86 84 82  Temp: 99 F (37.2 C)  99.3 F (37.4 C) 98.7 F (37.1 C)  TempSrc: Oral  Oral Oral  Resp: 18  20 18   Height:      Weight:       SpO2: 99%  100% 97%   General: Alert and awake, oriented x3, not in any acute distress. HEENT: anicteric sclera, pupils reactive to light and accommodation, EOMI CVS: S1-S2 clear, no murmur rubs or gallops Chest: clear to auscultation bilaterally, no wheezing, rales or rhonchi Abdomen: soft nontender, nondistended, normal bowel sounds, no organomegaly Extremities: no cyanosis, clubbing or edema noted bilaterally Neuro: Cranial nerves II-XII intact, no focal neurological deficits  Discharge Instructions  Discharge Orders   Future Appointments Provider Department Dept Phone   05/21/2013 9:45 AM Romero Belling, MD [facility] [phone]   Future Orders Complete By Expires     Diet Carb Modified  As directed     Increase activity slowly  As directed         Medication List    STOP taking these medications       cephALEXin 500 MG capsule  Commonly known as:  KEFLEX     etonogestrel-ethinyl estradiol 0.12-0.015 MG/24HR vaginal ring  Commonly known as:  NUVARING      TAKE these medications       acetaminophen 500 MG tablet  Commonly known as:  TYLENOL  Take 500-1,000 mg by mouth every 6 (six) hours as needed. For pain.     alendronate 70 MG tablet  Commonly known as:  FOSAMAX  Take 70 mg by mouth every 7 (seven) days. thursdays. Take with a full glass of water on an empty stomach.     ALPRAZolam 0.5 MG tablet  Commonly known as:  XANAX  Take 0.5 mg by mouth at bedtime.     aspirin EC 81 MG tablet  Take 81 mg by mouth daily.     azaTHIOprine 50 MG tablet  Commonly known as:  IMURAN  Take 50 mg by mouth 2 (two) times daily.     buPROPion 150 MG 12 hr tablet  Commonly known as:  WELLBUTRIN SR  Take 150 mg by mouth 2 (two) times daily.     cetirizine 10 MG tablet  Commonly known as:  ZYRTEC  Take 10 mg by mouth daily.     citalopram 20 MG tablet  Commonly known as:  CELEXA  Take 20 mg by mouth every evening.     clopidogrel 75 MG tablet   Commonly known as:  PLAVIX  Take 1 tablet (75 mg total) by mouth daily with breakfast.     COREG 3.125 MG tablet  Generic drug:  carvedilol  Take 3.125 mg by mouth 2 (two) times daily with a meal.     cycloSPORINE 0.05 % ophthalmic emulsion  Commonly known as:  RESTASIS  Place 1 drop into both eyes 3 (three) times daily.     famotidine 20 MG tablet  Commonly known as:  PEPCID  Take 20 mg by mouth 2 (two) times daily.     gabapentin 300 MG capsule  Commonly known as:  NEURONTIN  Take 300 mg by mouth 2 (two) times daily.     hydroxychloroquine 200 MG tablet  Commonly known as:  PLAQUENIL  Take 200 mg by mouth 2 (two) times daily.     insulin regular human CONCENTRATED 500 UNIT/ML Soln injection  Commonly known as:  HUMULIN R  Inject 150-175 Units into the skin 3 (three) times daily. 3x a day (just before each meal) 175units in the morning-150 units with lunch-125 units at dinner     INSULIN SYRINGE 1CC/29G 29G X 1/2" 1 ML Misc  Commonly known as:  B-D INSULIN SYRINGE  Use as directed three times daily. Diag. Code 250.0     isosorbide-hydrALAZINE 20-37.5 MG per tablet  Commonly known as:  BIDIL  Take 1 tablet by mouth 3 (three) times daily.     losartan 100 MG tablet  Commonly known as:  COZAAR  Take 50 mg by mouth daily.     metoCLOPramide 10 MG tablet  Commonly known as:  REGLAN  Take 10 mg by mouth 4 (four) times daily as needed (nausea).     potassium chloride SA 20 MEQ tablet  Commonly known as:  K-DUR,KLOR-CON  Take 20 mEq by mouth 3 (three) times daily.     pravastatin 10 MG tablet  Commonly known as:  PRAVACHOL  Take 10 mg by mouth at bedtime.     PROAIR HFA 108 (90 BASE) MCG/ACT inhaler  Generic drug:  albuterol  Inhale 2 puffs into the lungs every 6 (six) hours as needed. For shortness of breath/wheeze     promethazine 12.5 MG tablet  Commonly known as:  PHENERGAN  Take 12.5 mg by mouth every 6 (six) hours as needed for nausea.     torsemide 10 MG  tablet  Commonly known as:  DEMADEX  Take 10 mg by mouth 3 (three) times daily.     traZODone 100 MG tablet  Commonly known as:  DESYREL  Take 100 mg by mouth at bedtime.       Allergies  Allergen Reactions  . Oxycodone-Acetaminophen Hives      The results of significant diagnostics from this hospitalization (including imaging, microbiology, ancillary and laboratory) are listed below for reference.    Significant Diagnostic Studies: Ct Abdomen Pelvis W Wo Contrast  02/20/2013   *RADIOLOGY REPORT*  Clinical Data: Chest pain and upper abdominal pain with nausea and vomiting.  Shortness of breath.  CT ABDOMEN AND PELVIS WITHOUT AND WITH CONTRAST  Technique:  Multidetector CT imaging of the abdomen and pelvis was performed without contrast material in one or both body regions, followed by contrast material(s) and further sections in one or both body regions.  Contrast: OMNIPAQUE IOHEXOL 300 MG/ML  SOLN  Comparison: Ultrasound abdomen 02/20/2013.  Findings: Lung bases show scattered subsegmental atelectasis. Heart is enlarged.  No pericardial or pleural effusion.  Although the exam was intended to have arterial and portal venous phase imaging, an IV tubing malfunction occurred during contrast injection and the postcontrast images are both in the delayed nephrographic phase.  There is a heterogeneous mass in the right hepatic lobe, measuring approximately 5.9 x 7.7 cm in greatest transaxial dimension.  It bulges the liver contour slightly.  Liver measures approximately 22.6 cm.  Gallbladder, adrenal glands, kidneys, spleen, pancreas, stomach and bowel are unremarkable.  Uterus and ovaries are visualized.  No pathologically enlarged lymph nodes.  No worrisome lytic or sclerotic lesions.  IMPRESSION:  1.  Right hepatic lobe mass is poorly characterized on today's exam, for reasons given above.  Differential diagnosis is broad and includes abscess, hemangioma and neoplasm.  MR  abdomen without and  with contrast (preferably with Eovist) is recommended. 2.  Hepatomegaly.   Original Report Authenticated By: Leanna Battles, M.D.   Dg Chest 2 View  02/18/2013   *RADIOLOGY REPORT*  Clinical Data: Shortness of breath  CHEST - 2 VIEW  Comparison:  October 09, 2012  Findings:  Lungs are clear.  Heart is mildly enlarged with normal pulmonary vascularity.  No adenopathy.  No bone lesions.  IMPRESSION: No edema or consolidation.  Stable mild cardiac enlargement.   Original Report Authenticated By: Bretta Bang, M.D.   US Abdomen Complete  02/20/2013   *RADIOLOGY REPORT*  Abdominal ultrasound  History:  Abdominal pain  Findings:  Gallbladder is visualized in multiple projections. There are no gallstones, gallbladder wall thickening, or pericholecystic fluid collection.  There is no intrahepatic, common hepatic, or common bile duct dilatation.  Pancreas appears normal.  In the left lobe of the liver, there is a mass-like area which is slightly hyperechoic compared the remainder of the liver measuring 7.6 x 5.2 x 7.8 cm in size.  No other focal liver lesions are identified.  Spleen is normal in size and homogeneous in echotexture.  Kidneys bilaterally appear within normal limits.  There is no ascites. Aorta is nonaneurysmal.  Inferior vena cava appears patent.  Conclusion:  Mass-like area in the left lobe of the liver.  This finding warrants further evaluation.  MRI with and without intravenous contrast would be the imaging study of choice to further assess.  Study otherwise unremarkable.   Original Report Authenticated By: Bretta Bang, M.D.   Dg Chest Portable 1 View  02/20/2013   *RADIOLOGY REPORT*  Clinical Data: Chest pain, shortness of breath  PORTABLE CHEST - 1 VIEW  Comparison: Prior radiograph from 02/18/2013  Findings: Cardiomegaly is likely unchanged allowing for differences in technique.  Mediastinal silhouette is stable.  Current examination has been performed with a similar degree of lung  inflation. No frank pulmonary edema is identified.  No pleural effusion or airspace consolidation.  No pneumothorax.  Osseous structures are unchanged.  IMPRESSION: Stable cardiomegaly without frank pulmonary edema or airspace consolidation.   Original Report Authenticated By: Rise Mu, M.D.    Microbiology: Recent Results (from the past 240 hour(s))  URINE CULTURE     Status: None   Collection Time    02/18/13  7:27 PM      Result Value Range Status   Specimen Description URINE, CLEAN CATCH   Final   Special Requests NONE   Final   Culture  Setup Time 02/18/2013 21:03   Final   Colony Count NO GROWTH   Final   Culture NO GROWTH   Final   Report Status 02/20/2013 FINAL   Final  URINE CULTURE     Status: None   Collection Time    02/20/13  8:01 PM      Result Value Range Status   Specimen Description URINE, RANDOM   Final   Special Requests NONE   Final   Culture  Setup Time 02/21/2013 02:32   Final   Colony Count NO GROWTH   Final   Culture NO GROWTH   Final   Report Status 02/21/2013 FINAL   Final     Labs: Basic Metabolic Panel:  Recent Labs Lab 02/18/13 1806 02/20/13 0443 02/20/13 1431 02/21/13 0550 02/22/13 0638 02/23/13 0519  NA 139 142  --  143 139 140  K 3.5 3.7  --  3.7 3.4* 3.7  CL 102 106  --  107 103 103  CO2 23 24  --  27 23 29   GLUCOSE 179* 160*  --  134* 136* 138*  BUN 10 9  --  8 7 6   CREATININE 0.91 0.93 1.08 1.30* 1.15* 1.20*  CALCIUM 9.7 9.4  --  8.8 8.7 9.0   Liver Function Tests:  Recent Labs Lab 02/18/13 1806 02/20/13 0443 02/21/13 0550 02/22/13 0638  AST 15 24 25 28   ALT 7 17 24  33  ALKPHOS 92 82 80 81  BILITOT 0.3 0.3 0.4 0.3  PROT 7.4 6.9 6.7 6.6  ALBUMIN 3.1* 3.0* 2.9* 2.9*    Recent Labs Lab 02/18/13 1806 02/20/13 0443  LIPASE 24 22   No results found for this basename: AMMONIA,  in the last 168 hours CBC:  Recent Labs Lab 02/18/13 1806 02/20/13 0443 02/20/13 1431 02/21/13 0550 02/22/13 0638  WBC 11.5*  10.7* 9.9 9.2 9.2  NEUTROABS  --  6.8  --   --   --   HGB 10.8* 10.5* 10.8* 10.0* 10.4*  HCT 31.9* 31.6* 32.7* 31.2* 31.3*  MCV 81.8 82.5 83.0 84.1 82.8  PLT 367 343 346 312 291   Cardiac Enzymes:  Recent Labs Lab 02/20/13 0443  TROPONINI <0.30   BNP: BNP (last 3 results)  Recent Labs  10/09/12 1704 02/18/13 1806 02/20/13 0443  PROBNP 73.4 487.5* 397.9*   CBG:  Recent Labs Lab 02/22/13 1150 02/22/13 1653 02/22/13 2233 02/23/13 0843 02/23/13 1155  GLUCAP 149* 133* 120* 223* 146*       Signed:  Brezlyn Manrique A  [facility] 02/23/2013, 1:23 PM

## 2013-02-23 NOTE — Progress Notes (Signed)
NURSING PROGRESS NOTE  [name] [id number] Discharge Data: 02/23/2013 3:04 PM Attending Provider: Clydia Llano, MD YQM:[id number] C, MD     Karle Barr to be D/C'd Home per MD order.  Discussed with the patient the After Visit Summary and all questions fully answered. All IV's discontinued with no bleeding noted. All belongings returned to patient for patient to take home.   Last Vital Signs:  Blood pressure 139/89, pulse 82, temperature 98.7 F (37.1 C), temperature source Oral, resp. rate 18, height 5\' 8"  (1.727 m), weight 177.6 kg (391 lb 8.6 oz), SpO2 97.00%.  Discharge Medication List   Medication List    STOP taking these medications       cephALEXin 500 MG capsule  Commonly known as:  KEFLEX     etonogestrel-ethinyl estradiol 0.12-0.015 MG/24HR vaginal ring  Commonly known as:  NUVARING      TAKE these medications       acetaminophen 500 MG tablet  Commonly known as:  TYLENOL  Take 500-1,000 mg by mouth every 6 (six) hours as needed. For pain.     alendronate 70 MG tablet  Commonly known as:  FOSAMAX  Take 70 mg by mouth every 7 (seven) days. thursdays. Take with a full glass of water on an empty stomach.     ALPRAZolam 0.5 MG tablet  Commonly known as:  XANAX  Take 0.5 mg by mouth at bedtime.     aspirin EC 81 MG tablet  Take 81 mg by mouth daily.     azaTHIOprine 50 MG tablet  Commonly known as:  IMURAN  Take 50 mg by mouth 2 (two) times daily.     buPROPion 150 MG 12 hr tablet  Commonly known as:  WELLBUTRIN SR  Take 150 mg by mouth 2 (two) times daily.     cetirizine 10 MG tablet  Commonly known as:  ZYRTEC  Take 10 mg by mouth daily.     citalopram 20 MG tablet  Commonly known as:  CELEXA  Take 20 mg by mouth every evening.     clopidogrel 75 MG tablet  Commonly known as:  PLAVIX  Take 1 tablet (75 mg total) by mouth daily with breakfast.     COREG 3.125 MG tablet  Generic drug:  carvedilol  Take 3.125 mg by mouth 2 (two) times  daily with a meal.     cycloSPORINE 0.05 % ophthalmic emulsion  Commonly known as:  RESTASIS  Place 1 drop into both eyes 3 (three) times daily.     famotidine 20 MG tablet  Commonly known as:  PEPCID  Take 20 mg by mouth 2 (two) times daily.     gabapentin 300 MG capsule  Commonly known as:  NEURONTIN  Take 300 mg by mouth 2 (two) times daily.     hydroxychloroquine 200 MG tablet  Commonly known as:  PLAQUENIL  Take 200 mg by mouth 2 (two) times daily.     insulin regular human CONCENTRATED 500 UNIT/ML Soln injection  Commonly known as:  HUMULIN R  Inject 150-175 Units into the skin 3 (three) times daily. 3x a day (just before each meal) 175units in the morning-150 units with lunch-125 units at dinner     INSULIN SYRINGE 1CC/29G 29G X 1/2" 1 ML Misc  Commonly known as:  B-D INSULIN SYRINGE  Use as directed three times daily. Diag. Code 250.0     isosorbide-hydrALAZINE 20-37.5 MG per tablet  Commonly known as:  BIDIL  Take 1  tablet by mouth 3 (three) times daily.     losartan 100 MG tablet  Commonly known as:  COZAAR  Take 50 mg by mouth daily.     metoCLOPramide 10 MG tablet  Commonly known as:  REGLAN  Take 10 mg by mouth 4 (four) times daily as needed (nausea).     potassium chloride SA 20 MEQ tablet  Commonly known as:  K-DUR,KLOR-CON  Take 20 mEq by mouth 3 (three) times daily.     pravastatin 10 MG tablet  Commonly known as:  PRAVACHOL  Take 10 mg by mouth at bedtime.     PROAIR HFA 108 (90 BASE) MCG/ACT inhaler  Generic drug:  albuterol  Inhale 2 puffs into the lungs every 6 (six) hours as needed. For shortness of breath/wheeze     promethazine 12.5 MG tablet  Commonly known as:  PHENERGAN  Take 12.5 mg by mouth every 6 (six) hours as needed for nausea.     torsemide 10 MG tablet  Commonly known as:  DEMADEX  Take 10 mg by mouth 3 (three) times daily.     traZODone 100 MG tablet  Commonly known as:  DESYREL  Take 100 mg by mouth at bedtime.

## 2013-02-24 ENCOUNTER — Other Ambulatory Visit: Payer: Self-pay | Admitting: Physician Assistant

## 2013-02-24 ENCOUNTER — Ambulatory Visit
Admission: RE | Admit: 2013-02-24 | Discharge: 2013-02-24 | Disposition: A | Discharge status: Home / Self Care | Payer: Medicaid Other | Source: Ambulatory Visit | Attending: Physician Assistant | Admitting: Physician Assistant

## 2013-02-24 DIAGNOSIS — R160 Hepatomegaly, not elsewhere classified: Secondary | ICD-10-CM

## 2013-02-24 MED ORDER — GADOXETATE DISODIUM 0.25 MMOL/ML IV SOLN
10.0000 mL | Freq: Once | INTRAVENOUS | Status: AC | PRN
  Administered 2013-02-24: 10 mL via INTRAVENOUS

## 2013-02-25 ENCOUNTER — Inpatient Hospital Stay (HOSPITAL_COMMUNITY)
Admission: EM | Admit: 2013-02-25 | Discharge: 2013-03-04 | DRG: 392 | Disposition: A | Discharge status: Home / Self Care | Payer: Medicaid Other | Attending: Internal Medicine | Admitting: Internal Medicine

## 2013-02-25 ENCOUNTER — Emergency Department (HOSPITAL_COMMUNITY): Payer: Medicaid Other

## 2013-02-25 ENCOUNTER — Encounter (HOSPITAL_COMMUNITY): Payer: Self-pay | Admitting: *Deleted

## 2013-02-25 DIAGNOSIS — K2100 Gastro-esophageal reflux disease with esophagitis, without bleeding: Secondary | ICD-10-CM | POA: Diagnosis present

## 2013-02-25 DIAGNOSIS — I639 Cerebral infarction, unspecified: Secondary | ICD-10-CM

## 2013-02-25 DIAGNOSIS — I959 Hypotension, unspecified: Secondary | ICD-10-CM | POA: Diagnosis present

## 2013-02-25 DIAGNOSIS — D1803 Hemangioma of intra-abdominal structures: Secondary | ICD-10-CM | POA: Diagnosis present

## 2013-02-25 DIAGNOSIS — E860 Dehydration: Secondary | ICD-10-CM | POA: Diagnosis present

## 2013-02-25 DIAGNOSIS — E162 Hypoglycemia, unspecified: Secondary | ICD-10-CM

## 2013-02-25 DIAGNOSIS — R1084 Generalized abdominal pain: Secondary | ICD-10-CM | POA: Diagnosis present

## 2013-02-25 DIAGNOSIS — J9611 Chronic respiratory failure with hypoxia: Secondary | ICD-10-CM | POA: Diagnosis present

## 2013-02-25 DIAGNOSIS — R932 Abnormal findings on diagnostic imaging of liver and biliary tract: Secondary | ICD-10-CM

## 2013-02-25 DIAGNOSIS — E109 Type 1 diabetes mellitus without complications: Secondary | ICD-10-CM

## 2013-02-25 DIAGNOSIS — I10 Essential (primary) hypertension: Secondary | ICD-10-CM

## 2013-02-25 DIAGNOSIS — J449 Chronic obstructive pulmonary disease, unspecified: Secondary | ICD-10-CM | POA: Diagnosis present

## 2013-02-25 DIAGNOSIS — J189 Pneumonia, unspecified organism: Secondary | ICD-10-CM

## 2013-02-25 DIAGNOSIS — R079 Chest pain, unspecified: Secondary | ICD-10-CM

## 2013-02-25 DIAGNOSIS — D649 Anemia, unspecified: Secondary | ICD-10-CM

## 2013-02-25 DIAGNOSIS — K3184 Gastroparesis: Secondary | ICD-10-CM | POA: Diagnosis present

## 2013-02-25 DIAGNOSIS — R1110 Vomiting, unspecified: Secondary | ICD-10-CM | POA: Diagnosis present

## 2013-02-25 DIAGNOSIS — K7689 Other specified diseases of liver: Secondary | ICD-10-CM | POA: Diagnosis present

## 2013-02-25 DIAGNOSIS — Z9981 Dependence on supplemental oxygen: Secondary | ICD-10-CM

## 2013-02-25 DIAGNOSIS — K209 Esophagitis, unspecified without bleeding: Principal | ICD-10-CM | POA: Diagnosis present

## 2013-02-25 DIAGNOSIS — Z885 Allergy status to narcotic agent status: Secondary | ICD-10-CM

## 2013-02-25 DIAGNOSIS — Z794 Long term (current) use of insulin: Secondary | ICD-10-CM

## 2013-02-25 DIAGNOSIS — J4489 Other specified chronic obstructive pulmonary disease: Secondary | ICD-10-CM | POA: Diagnosis present

## 2013-02-25 DIAGNOSIS — J9610 Chronic respiratory failure, unspecified whether with hypoxia or hypercapnia: Secondary | ICD-10-CM | POA: Diagnosis present

## 2013-02-25 DIAGNOSIS — E872 Acidosis, unspecified: Secondary | ICD-10-CM | POA: Diagnosis present

## 2013-02-25 DIAGNOSIS — Z6841 Body Mass Index (BMI) 40.0 and over, adult: Secondary | ICD-10-CM

## 2013-02-25 DIAGNOSIS — I509 Heart failure, unspecified: Secondary | ICD-10-CM | POA: Diagnosis present

## 2013-02-25 DIAGNOSIS — Z87891 Personal history of nicotine dependence: Secondary | ICD-10-CM

## 2013-02-25 DIAGNOSIS — G510 Bell's palsy: Secondary | ICD-10-CM

## 2013-02-25 DIAGNOSIS — M329 Systemic lupus erythematosus, unspecified: Secondary | ICD-10-CM | POA: Diagnosis present

## 2013-02-25 DIAGNOSIS — I5023 Acute on chronic systolic (congestive) heart failure: Secondary | ICD-10-CM

## 2013-02-25 DIAGNOSIS — I5032 Chronic diastolic (congestive) heart failure: Secondary | ICD-10-CM

## 2013-02-25 DIAGNOSIS — Z823 Family history of stroke: Secondary | ICD-10-CM

## 2013-02-25 DIAGNOSIS — Z8673 Personal history of transient ischemic attack (TIA), and cerebral infarction without residual deficits: Secondary | ICD-10-CM

## 2013-02-25 DIAGNOSIS — D72829 Elevated white blood cell count, unspecified: Secondary | ICD-10-CM

## 2013-02-25 DIAGNOSIS — R7401 Elevation of levels of liver transaminase levels: Secondary | ICD-10-CM

## 2013-02-25 DIAGNOSIS — N390 Urinary tract infection, site not specified: Secondary | ICD-10-CM

## 2013-02-25 DIAGNOSIS — IMO0002 Reserved for concepts with insufficient information to code with codable children: Secondary | ICD-10-CM

## 2013-02-25 DIAGNOSIS — E1149 Type 2 diabetes mellitus with other diabetic neurological complication: Secondary | ICD-10-CM | POA: Diagnosis present

## 2013-02-25 DIAGNOSIS — R7402 Elevation of levels of lactic acid dehydrogenase (LDH): Secondary | ICD-10-CM

## 2013-02-25 DIAGNOSIS — R109 Unspecified abdominal pain: Secondary | ICD-10-CM | POA: Diagnosis present

## 2013-02-25 DIAGNOSIS — G4733 Obstructive sleep apnea (adult) (pediatric): Secondary | ICD-10-CM | POA: Diagnosis present

## 2013-02-25 DIAGNOSIS — R1013 Epigastric pain: Secondary | ICD-10-CM

## 2013-02-25 DIAGNOSIS — R0602 Shortness of breath: Secondary | ICD-10-CM

## 2013-02-25 DIAGNOSIS — K769 Liver disease, unspecified: Secondary | ICD-10-CM

## 2013-02-25 DIAGNOSIS — Z7982 Long term (current) use of aspirin: Secondary | ICD-10-CM

## 2013-02-25 DIAGNOSIS — E119 Type 2 diabetes mellitus without complications: Secondary | ICD-10-CM

## 2013-02-25 DIAGNOSIS — F411 Generalized anxiety disorder: Secondary | ICD-10-CM | POA: Diagnosis present

## 2013-02-25 DIAGNOSIS — Z833 Family history of diabetes mellitus: Secondary | ICD-10-CM

## 2013-02-25 DIAGNOSIS — R160 Hepatomegaly, not elsewhere classified: Secondary | ICD-10-CM | POA: Diagnosis present

## 2013-02-25 DIAGNOSIS — M3500 Sicca syndrome, unspecified: Secondary | ICD-10-CM | POA: Diagnosis present

## 2013-02-25 LAB — HEPATIC FUNCTION PANEL
Bilirubin, Direct: <0.1 mg/dL (ref 0.0–0.3)
Total Bilirubin: 0.2 mg/dL — ABNORMAL LOW (ref 0.3–1.2)

## 2013-02-25 LAB — CBC
MCH: 27.8 pg (ref 26.0–34.0)
MCHC: 34.1 g/dL (ref 30.0–36.0)
Platelets: 299 10*3/uL (ref 150–400)

## 2013-02-25 LAB — GLUCOSE, CAPILLARY
Glucose-Capillary: 46 mg/dL — ABNORMAL LOW (ref 70–99)
Glucose-Capillary: 55 mg/dL — ABNORMAL LOW (ref 70–99)

## 2013-02-25 LAB — POCT I-STAT TROPONIN I: Troponin i, poc: 0.01 ng/mL (ref 0.00–0.08)

## 2013-02-25 LAB — BASIC METABOLIC PANEL
Calcium: 9.4 mg/dL (ref 8.4–10.5)
GFR calc Af Amer: 83 mL/min — ABNORMAL LOW (ref >90)
GFR calc non Af Amer: 72 mL/min — ABNORMAL LOW (ref >90)
Potassium: 3.3 mEq/L — ABNORMAL LOW (ref 3.5–5.1)
Sodium: 139 mEq/L (ref 135–145)

## 2013-02-25 LAB — PRO B NATRIURETIC PEPTIDE: Pro B Natriuretic peptide (BNP): 162.5 pg/mL — ABNORMAL HIGH (ref 0–125)

## 2013-02-25 LAB — LIPASE, BLOOD: Lipase: 20 U/L (ref 11–59)

## 2013-02-25 LAB — CG4 I-STAT (LACTIC ACID): Lactic Acid, Venous: 2.52 mmol/L — ABNORMAL HIGH (ref 0.5–2.2)

## 2013-02-25 MED ORDER — ONDANSETRON HCL 4 MG/2ML IJ SOLN: 4.0000 mg | Freq: Three times a day (TID) | INTRAMUSCULAR | Status: DC | PRN

## 2013-02-25 MED ORDER — SODIUM CHLORIDE 0.9 % IV SOLN
INTRAVENOUS | Status: DC
  Administered 2013-02-25: via INTRAVENOUS

## 2013-02-25 MED ORDER — ONDANSETRON HCL 4 MG/2ML IJ SOLN
4.0000 mg | Freq: Once | INTRAMUSCULAR | Status: AC
  Administered 2013-02-25: 4 mg via INTRAVENOUS
  Filled 2013-02-25 (×2): qty 2

## 2013-02-25 MED ORDER — HYDROMORPHONE HCL PF 1 MG/ML IJ SOLN
1.0000 mg | Freq: Once | INTRAMUSCULAR | Status: AC
  Administered 2013-02-25: 1 mg via INTRAVENOUS
  Filled 2013-02-25: qty 1

## 2013-02-25 MED ORDER — SODIUM CHLORIDE 0.9 % IV BOLUS (SEPSIS)
1000.0000 mL | Freq: Once | INTRAVENOUS | Status: AC
  Administered 2013-02-25: 1000 mL via INTRAVENOUS

## 2013-02-25 MED ORDER — DEXTROSE-NACL 5-0.45 % IV SOLN
INTRAVENOUS | Status: DC
  Administered 2013-02-26: via INTRAVENOUS

## 2013-02-25 MED ORDER — DEXTROSE 50 % IV SOLN
50.0000 mL | Freq: Once | INTRAVENOUS | Status: AC
  Administered 2013-02-25: 50 mL via INTRAVENOUS
  Filled 2013-02-25: qty 50

## 2013-02-25 NOTE — ED Provider Notes (Signed)
CSN: [id number]     Arrival date & time 02/25/13  1855 History     First MD Initiated Contact with Patient 02/25/13 2020     Chief Complaint  Patient presents with  . Shortness of Breath  . Emesis   (Consider location/radiation/quality/duration/timing/severity/associated sxs/prior Treatment) HPI Comments: Pt w/ hx of SLE, DM, CHF and CVA now w/ intractable emesis and abd pain. Was discharged from hospital 3 days ago for similar sx. MR abd obtained yesterday revealing hepatic mass. + hx of estrogen use. No known cancer. No hx of hepatitis. States ever since discharge persistent emesis w/ PO intake. Unable to tolerated solids or liquids. Emesis is NBNB. Does admit to dark brown color. No melena or hematochezia. Normal uop w/out dysuria/hematuria or polyuria. Emesis is a/w persistent epigastric sharp pain. Pt also notes sharp chest pain a/w emesis. Denies chest pain w/ exertion. No hx of CAD. On arrival to ED pt is actively vomiting and c/o above sx.    Patient is a 42 y.o. female presenting with general illness. The history is provided by the patient. No language interpreter was used.  Illness Location:  GI Quality:  N/v abd pain Severity:  Severe Onset quality:  Gradual Timing:  Constant Progression:  Worsening Chronicity:  Recurrent Associated symptoms: abdominal pain, chest pain, nausea and vomiting   Associated symptoms: no congestion, no cough, no diarrhea, no fever, no headaches, no rash, no shortness of breath and no sore throat     Past Medical History  Diagnosis Date  . IDDM 08/09/2010  . Bell's palsy 08/09/2010  . SLE 08/09/2010  . SJOGREN'S SYNDROME 08/09/2010  . Hypertension   . High cholesterol   . CHF (congestive heart failure)   . Anginal pain   . Shortness of breath     "all the time"  . Obstructive sleep apnea on CPAP 2011  . Migraines   . Stroke 2010    "left side is still weak from it; never fully regained full strength"  . Anxiety   . GERD (gastroesophageal  reflux disease)    Past Surgical History  Procedure Laterality Date  . Ectopic pregnancy surgery  1999  . Cardiac catheterization  02/07/12  . Hernia repair  1980's    umbilical   Family History  Problem Relation Age of Onset  . Diabetes Mother   . Hypertension Other   . Stroke Other   . Arthritis Other    History  Substance Use Topics  . Smoking status: Former Smoker -- 3 years    Types: Cigarettes    Quit date: 06/01/1990  . Smokeless tobacco: Never Used  . Alcohol Use: No   OB History   Grav Para Term Preterm Abortions TAB SAB Ect Mult Living                 Review of Systems  Constitutional: Negative for fever and chills.  HENT: Negative for congestion and sore throat.   Respiratory: Negative for cough and shortness of breath.   Cardiovascular: Positive for chest pain. Negative for leg swelling.  Gastrointestinal: Positive for nausea, vomiting and abdominal pain. Negative for diarrhea and constipation.  Genitourinary: Negative for dysuria and frequency.  Skin: Negative for color change and rash.  Neurological: Negative for dizziness and headaches.  Psychiatric/Behavioral: Negative for confusion and agitation.  All other systems reviewed and are negative.    Allergies  Oxycodone-acetaminophen  Home Medications   Current Outpatient Rx  Name  Route  Sig  Dispense  Refill  . acetaminophen (TYLENOL) 500 MG tablet   Oral   Take 500-1,000 mg by mouth every 6 (six) hours as needed. For pain.         Marland Kitchen albuterol (PROAIR HFA) 108 (90 BASE) MCG/ACT inhaler   Inhalation   Inhale 2 puffs into the lungs every 6 (six) hours as needed. For shortness of breath/wheeze         . alendronate (FOSAMAX) 70 MG tablet   Oral   Take 70 mg by mouth every 7 (seven) days. thursdays. Take with a full glass of water on an empty stomach.         . ALPRAZolam (XANAX) 0.5 MG tablet   Oral   Take 0.5 mg by mouth at bedtime.         Marland Kitchen aspirin EC 81 MG tablet   Oral   Take  81 mg by mouth daily.         Marland Kitchen azaTHIOprine (IMURAN) 50 MG tablet   Oral   Take 50 mg by mouth 2 (two) times daily.          Marland Kitchen buPROPion (WELLBUTRIN SR) 150 MG 12 hr tablet   Oral   Take 150 mg by mouth 2 (two) times daily.         . carvedilol (COREG) 3.125 MG tablet   Oral   Take 3.125 mg by mouth 2 (two) times daily with a meal.         . cetirizine (ZYRTEC) 10 MG tablet   Oral   Take 10 mg by mouth daily.         . citalopram (CELEXA) 20 MG tablet   Oral   Take 20 mg by mouth every evening.         . clopidogrel (PLAVIX) 75 MG tablet   Oral   Take 75 mg by mouth daily.         . cycloSPORINE (RESTASIS) 0.05 % ophthalmic emulsion   Both Eyes   Place 1 drop into both eyes 3 (three) times daily.         . famotidine (PEPCID) 20 MG tablet   Oral   Take 20 mg by mouth 2 (two) times daily.         Marland Kitchen gabapentin (NEURONTIN) 300 MG capsule   Oral   Take 300 mg by mouth 2 (two) times daily.          . hydroxychloroquine (PLAQUENIL) 200 MG tablet   Oral   Take 200 mg by mouth 2 (two) times daily.          . insulin regular human CONCENTRATED (HUMULIN R) 500 UNIT/ML SOLN injection   Subcutaneous   Inject 150-175 Units into the skin 3 (three) times daily. 3x a day (just before each meal) 175units in the morning-150 units with lunch-125 units at dinner         . isosorbide-hydrALAZINE (BIDIL) 20-37.5 MG per tablet   Oral   Take 1 tablet by mouth 3 (three) times daily.         Marland Kitchen losartan (COZAAR) 100 MG tablet   Oral   Take 50 mg by mouth daily.          . potassium chloride SA (K-DUR,KLOR-CON) 20 MEQ tablet   Oral   Take 20 mEq by mouth 3 (three) times daily.          . pravastatin (PRAVACHOL) 10 MG tablet   Oral   Take 10 mg by  mouth at bedtime.           . torsemide (DEMADEX) 10 MG tablet   Oral   Take 10 mg by mouth 3 (three) times daily.          . traZODone (DESYREL) 100 MG tablet   Oral   Take 100 mg by mouth at  bedtime.          BP 143/94  Pulse 100  Temp(Src) 99.9 F (37.7 C) (Oral)  Resp 28  Wt 391 lb (177.356 kg)  BMI 59.46 kg/m2  SpO2 100%  LMP 02/25/2013 Physical Exam  Vitals reviewed. Constitutional: She is oriented to person, place, and time. She appears well-developed and well-nourished. No distress.  HENT:  Head: Normocephalic and atraumatic.  Mouth/Throat: Mucous membranes are dry.  Eyes: EOM are normal. Pupils are equal, round, and reactive to light.  Neck: Normal range of motion. Neck supple.  Cardiovascular: Regular rhythm.  Tachycardia present.   Pulmonary/Chest: Effort normal. No respiratory distress. She has decreased breath sounds.  Abdominal: Soft. She exhibits no distension. There is tenderness in the epigastric area. There is no rigidity, no rebound, no guarding, no CVA tenderness, no tenderness at McBurney's point and negative Murphy's sign.    Musculoskeletal: Normal range of motion. She exhibits no edema.  Neurological: She is alert and oriented to person, place, and time.  Skin: Skin is warm and dry.  Psychiatric: She has a normal mood and affect. Her behavior is normal.    ED Course   Procedures (including critical care time)  Results for orders placed during the hospital encounter of 02/25/13  PRO B NATRIURETIC PEPTIDE      Result Value Range   Pro B Natriuretic peptide (BNP) 162.5 (*) 0 - 125 pg/mL  CBC      Result Value Range   WBC 12.4 (*) 4.0 - 10.5 K/uL   RBC 3.95  3.87 - 5.11 MIL/uL   Hemoglobin 11.0 (*) 12.0 - 15.0 g/dL   HCT 40.9 (*) 81.1 - 91.4 %   MCV 81.8  78.0 - 100.0 fL   MCH 27.8  26.0 - 34.0 pg   MCHC 34.1  30.0 - 36.0 g/dL   RDW 78.2  95.6 - 21.3 %   Platelets 299  150 - 400 K/uL  BASIC METABOLIC PANEL      Result Value Range   Sodium 139  135 - 145 mEq/L   Potassium 3.3 (*) 3.5 - 5.1 mEq/L   Chloride 103  96 - 112 mEq/L   CO2 26  19 - 32 mEq/L   Glucose, Bld 42 (*) 70 - 99 mg/dL   BUN 8  6 - 23 mg/dL   Creatinine, Ser  0.86  0.50 - 1.10 mg/dL   Calcium 9.4  8.4 - 57.8 mg/dL   GFR calc non Af Amer 72 (*) >90 mL/min   GFR calc Af Amer 83 (*) >90 mL/min  LIPASE, BLOOD      Result Value Range   Lipase 20  11 - 59 U/L  HEPATIC FUNCTION PANEL      Result Value Range   Total Protein 7.1  6.0 - 8.3 g/dL   Albumin 2.9 (*) 3.5 - 5.2 g/dL   AST 64 (*) 0 - 37 U/L   ALT 56 (*) 0 - 35 U/L   Alkaline Phosphatase 77  39 - 117 U/L   Total Bilirubin 0.2 (*) 0.3 - 1.2 mg/dL   Bilirubin, Direct <4.6  0.0 - 0.3  mg/dL   Indirect Bilirubin NOT CALCULATED  0.3 - 0.9 mg/dL  GLUCOSE, CAPILLARY      Result Value Range   Glucose-Capillary 46 (*) 70 - 99 mg/dL  POCT I-STAT TROPONIN I      Result Value Range   Troponin i, poc 0.01  0.00 - 0.08 ng/mL   Comment 3           CG4 I-STAT (LACTIC ACID)      Result Value Range   Lactic Acid, Venous 2.52 (*) 0.5 - 2.2 mmol/L    Dg Chest 2 View  02/25/2013   *RADIOLOGY REPORT*  Clinical Data: Shortness of breath  CHEST - 2 VIEW  Comparison: February 20, 2013  Findings: Lungs clear.  Heart is borderline enlarged with normal pulmonary vascularity.  No adenopathy.  No bone lesions.  IMPRESSION: No edema or consolidation.  Heart borderline enlarged.   Original Report Authenticated By: Bretta Bang, M.D.   Mr Abdomen W Wo Contrast  02/25/2013   **ADDENDUM** CREATED: 02/25/2013 09:05:20  These findings and recommendations were called by telephone on 02/25/2013 at 09:00 AM to [facility], who verbally acknowledged these results.  **END ADDENDUM** SIGNED BY: Florencia Reasons, M.D.  02/25/2013   *RADIOLOGY REPORT*  Clinical Data: Follow-up evaluation of liver lesions seen on CT examination.  MRI ABDOMEN WITH AND WITHOUT CONTRAST  Technique:  Multiplanar multisequence MR imaging of the abdomen was performed both before and after administration of intravenous contrast.  Contrast:  10 ml Eovist  Comparison: CT of the abdomen and pelvis 02/20/2013.  Comment:  Study is limited by considerable  patient motion, particularly on the postcontrast images, which affects both have their quality, as well as the fidelity of the subtraction images.  Findings: Within the central aspect of the liver, predominantly involving segments 4B and 5, there is a 7.1 x 7.6 x 6.4 cm lesion and is heterogeneous in signal intensity on pregadolinium T1 and T2- weighted images.  The periphery of the lesion is predominantly T1 hyperintense and T2 hypointense, while the central aspect of the lesion is heterogeneously T1 hypointense and generally T2 hyperintense.  Post gadolinium imaging is limited by considerable patient motion which limits accurate assessment of internal enhancement.  There does appear to be some heterogeneous internal enhancement which is predominately peripheral and nodular, with some progressive enhancement centrally.  Delayed post gadolinium images obtained at 20 minutes demonstrates accumulation of the contrast within the liver parenchyma around the lesion, but there is relative paucity of contrast within the lesion, indicative of a lesion of non hepatocyte origin.  Within the visualized portions of the abdomen, the appearance of the gallbladder, pancreas, spleen, bilateral adrenal glands and bilateral kidneys is unremarkable.  IMPRESSION: 1.  7.1 x 7.6 x 6.4 cm lesion in segment 4B and 5 of the liver has unusual imaging characteristics, detailed above.  Given the lack of accumulation of contrast agent on delayed phase imaging with Eovist, which indicates a lesion not of hepatocellular origin, differential considerations primarily include an atypical giant cavernous hemangioma, atypical adenoma with central necrosis, or less likely a large solitary metastasis with central necrosis. Lack of enhancement on the delayed images with Eovist indicates that this is not a large focal nodular hyperplasia (FNH).  At this time, a short term follow-up MRI with Multihance (not Eovist) in 2- 3 months is recommended to assess  for size stability, and to better evaluate the dynamic post-contrast enhancement characteristics of this lesion.  Original Report Authenticated By: Reuel Boom  Entrikin, M.D.  Angiocath insertion Performed by: Audelia Hives  Consent: Verbal consent obtained. Risks and benefits: risks, benefits and alternatives were discussed Time out: Immediately prior to procedure a "time out" was called to verify the correct patient, procedure, equipment, support staff and site/side marked as required.  Preparation: Patient was prepped and draped in the usual sterile fashion.  Vein Location: right AC  Ultrasound Guided  Gauge: 20  Normal blood return and flush without difficulty Patient tolerance: Patient tolerated the procedure well with no immediate complications.   Date: 02/25/2013  Rate: 99  Rhythm: normal sinus rhythm  QRS Axis: normal  Intervals: normal  ST/T Wave abnormalities: nonspecific ST/T changes  Conduction Disutrbances:none  Narrative Interpretation:   Old EKG Reviewed: unchanged    No diagnosis found.  MDM  Exam as above, clinically dehydrated w/ tachycardia, actively vomiting, abd ttp epigastric, not acute surgical abd, doubt perforated viscus. Recent abd MR yesterday reveals hepatic mass of unknown etiology. Labs significant for mild leukocytosis, glucose 46 - given D50 - unable to tolerate PO - repeat glucose 55 - placed on D5 1/2 NS at 125. Mild elevation LFTs, lipase normal, lactic acid 2.52, given 1L IVF, dilaudid and zofran. Chest pain atypical and a/w emesis. Doubt boerhaave, ACS or PE. Troponin negative, BNP 162, ECG w/out acute ischemia. CXR - NACPF. D/w internal medicine and pt admitted for dehydration, hypoglycemia and intractable abd pain and emesis. Admit in serious condition. At this time doubt mesenteric ischemia, SBO, appendicitis, diverticulitis.   I have personally reviewed labs and imaging and considered in my MDM. Case d/w Dr Blinda Leatherwood  1. Intractable vomiting    2. Abdominal  pain, other specified site   3. Hypoglycemia   4. Lactic acidosis      Audelia Hives, MD 02/25/13 [id number]

## 2013-02-25 NOTE — ED Notes (Signed)
CBG Collected, result = 46

## 2013-02-25 NOTE — ED Notes (Signed)
Pt in c/o shortness of breath and vomiting, states she was just discharged from the hospital for the same symptoms, pt states since she has been home she has been vomiting constantly, pt wears oxygen at home, pt speaking in full sentences during triage

## 2013-02-25 NOTE — ED Notes (Signed)
This RN notified of cbg of 42, met pt in radiology and offered pt milk, pt declined. Pt willing to accept OJ, instructed radiology to take pt to Trauma room C, receiving RN notified. Pt alert, NAD, calm, interactive, sitting upright in w/c.

## 2013-02-26 ENCOUNTER — Encounter (HOSPITAL_COMMUNITY)
Admission: EM | Disposition: A | Discharge status: Home / Self Care | Payer: Self-pay | Source: Home / Self Care | Attending: Internal Medicine

## 2013-02-26 ENCOUNTER — Inpatient Hospital Stay (HOSPITAL_COMMUNITY): Payer: Medicaid Other

## 2013-02-26 ENCOUNTER — Encounter (HOSPITAL_COMMUNITY): Payer: Self-pay | Admitting: Internal Medicine

## 2013-02-26 DIAGNOSIS — I6350 Cerebral infarction due to unspecified occlusion or stenosis of unspecified cerebral artery: Secondary | ICD-10-CM

## 2013-02-26 DIAGNOSIS — E872 Acidosis: Secondary | ICD-10-CM | POA: Diagnosis present

## 2013-02-26 DIAGNOSIS — R1115 Cyclical vomiting syndrome unrelated to migraine: Secondary | ICD-10-CM

## 2013-02-26 DIAGNOSIS — E119 Type 2 diabetes mellitus without complications: Secondary | ICD-10-CM

## 2013-02-26 DIAGNOSIS — R932 Abnormal findings on diagnostic imaging of liver and biliary tract: Secondary | ICD-10-CM

## 2013-02-26 DIAGNOSIS — M329 Systemic lupus erythematosus, unspecified: Secondary | ICD-10-CM

## 2013-02-26 DIAGNOSIS — R1013 Epigastric pain: Secondary | ICD-10-CM

## 2013-02-26 DIAGNOSIS — I5032 Chronic diastolic (congestive) heart failure: Secondary | ICD-10-CM

## 2013-02-26 DIAGNOSIS — K2100 Gastro-esophageal reflux disease with esophagitis, without bleeding: Secondary | ICD-10-CM

## 2013-02-26 DIAGNOSIS — R109 Unspecified abdominal pain: Secondary | ICD-10-CM | POA: Diagnosis present

## 2013-02-26 DIAGNOSIS — E162 Hypoglycemia, unspecified: Secondary | ICD-10-CM | POA: Diagnosis present

## 2013-02-26 DIAGNOSIS — I10 Essential (primary) hypertension: Secondary | ICD-10-CM

## 2013-02-26 DIAGNOSIS — R1110 Vomiting, unspecified: Secondary | ICD-10-CM | POA: Diagnosis present

## 2013-02-26 DIAGNOSIS — K7689 Other specified diseases of liver: Secondary | ICD-10-CM

## 2013-02-26 DIAGNOSIS — R1084 Generalized abdominal pain: Secondary | ICD-10-CM | POA: Diagnosis present

## 2013-02-26 HISTORY — PX: ESOPHAGOGASTRODUODENOSCOPY: SHX5428

## 2013-02-26 LAB — COMPREHENSIVE METABOLIC PANEL
ALT: 107 U/L — ABNORMAL HIGH (ref 0–35)
CO2: 23 mEq/L (ref 19–32)
Calcium: 8.8 mg/dL (ref 8.4–10.5)
Creatinine, Ser: 1.04 mg/dL (ref 0.50–1.10)
GFR calc Af Amer: 76 mL/min — ABNORMAL LOW (ref >90)
GFR calc non Af Amer: 65 mL/min — ABNORMAL LOW (ref >90)
Glucose, Bld: 102 mg/dL — ABNORMAL HIGH (ref 70–99)
Sodium: 139 mEq/L (ref 135–145)
Total Protein: 6.1 g/dL (ref 6.0–8.3)

## 2013-02-26 LAB — GLUCOSE, CAPILLARY
Glucose-Capillary: 108 mg/dL — ABNORMAL HIGH (ref 70–99)
Glucose-Capillary: 115 mg/dL — ABNORMAL HIGH (ref 70–99)
Glucose-Capillary: 179 mg/dL — ABNORMAL HIGH (ref 70–99)
Glucose-Capillary: 78 mg/dL (ref 70–99)

## 2013-02-26 LAB — CBC
HCT: 29.5 % — ABNORMAL LOW (ref 36.0–46.0)
Hemoglobin: 9.5 g/dL — ABNORMAL LOW (ref 12.0–15.0)
MCHC: 32.2 g/dL (ref 30.0–36.0)
RBC: 3.55 MIL/uL — ABNORMAL LOW (ref 3.87–5.11)
WBC: 9.2 10*3/uL (ref 4.0–10.5)

## 2013-02-26 LAB — PROTIME-INR
INR: 1.05 (ref 0.00–1.49)
Prothrombin Time: 13.5 seconds (ref 11.6–15.2)

## 2013-02-26 LAB — AFP TUMOR MARKER: AFP-Tumor Marker: 5 ng/mL (ref 0.0–8.0)

## 2013-02-26 SURGERY — EGD (ESOPHAGOGASTRODUODENOSCOPY)
Anesthesia: Moderate Sedation

## 2013-02-26 MED ORDER — GABAPENTIN 300 MG PO CAPS
300.0000 mg | ORAL_CAPSULE | Freq: Two times a day (BID) | ORAL | Status: DC
  Administered 2013-02-26 – 2013-03-04 (×14): 300 mg via ORAL
  Filled 2013-02-26 (×15): qty 1

## 2013-02-26 MED ORDER — LORAZEPAM 2 MG/ML IJ SOLN
0.5000 mg | Freq: Two times a day (BID) | INTRAMUSCULAR | Status: DC | PRN
  Administered 2013-02-26: 0.5 mg via INTRAVENOUS
  Filled 2013-02-26: qty 1

## 2013-02-26 MED ORDER — PANTOPRAZOLE SODIUM 40 MG IV SOLR
40.0000 mg | INTRAVENOUS | Status: DC
  Administered 2013-02-26: 40 mg via INTRAVENOUS
  Filled 2013-02-26: qty 40

## 2013-02-26 MED ORDER — HYDROMORPHONE HCL PF 1 MG/ML IJ SOLN
0.5000 mg | INTRAMUSCULAR | Status: DC | PRN
  Administered 2013-02-26 – 2013-02-28 (×9): 1 mg via INTRAVENOUS
  Filled 2013-02-26 (×10): qty 1

## 2013-02-26 MED ORDER — ALBUTEROL SULFATE HFA 108 (90 BASE) MCG/ACT IN AERS: 2.0000 | INHALATION_SPRAY | Freq: Four times a day (QID) | RESPIRATORY_TRACT | Status: DC | PRN

## 2013-02-26 MED ORDER — GLUCOSE 40 % PO GEL: 1.0000 | ORAL | Status: DC | PRN

## 2013-02-26 MED ORDER — LOSARTAN POTASSIUM 50 MG PO TABS
50.0000 mg | ORAL_TABLET | Freq: Every day | ORAL | Status: DC
  Administered 2013-02-26 – 2013-03-04 (×7): 50 mg via ORAL
  Filled 2013-02-26 (×7): qty 1

## 2013-02-26 MED ORDER — FAMOTIDINE 20 MG PO TABS
20.0000 mg | ORAL_TABLET | Freq: Two times a day (BID) | ORAL | Status: DC
  Administered 2013-02-26: 20 mg via ORAL
  Filled 2013-02-26 (×3): qty 1

## 2013-02-26 MED ORDER — MIDAZOLAM HCL 5 MG/ML IJ SOLN
INTRAMUSCULAR | Status: AC
  Filled 2013-02-26: qty 2

## 2013-02-26 MED ORDER — HYDROMORPHONE HCL PF 1 MG/ML IJ SOLN: 0.5000 mg | INTRAMUSCULAR | Status: DC | PRN

## 2013-02-26 MED ORDER — ONDANSETRON HCL 4 MG PO TABS: 4.0000 mg | ORAL_TABLET | Freq: Four times a day (QID) | ORAL | Status: DC | PRN

## 2013-02-26 MED ORDER — ONDANSETRON HCL 4 MG/2ML IJ SOLN
4.0000 mg | Freq: Four times a day (QID) | INTRAMUSCULAR | Status: DC | PRN
  Administered 2013-02-26 – 2013-03-01 (×7): 4 mg via INTRAVENOUS
  Filled 2013-02-26 (×7): qty 2

## 2013-02-26 MED ORDER — CLOPIDOGREL BISULFATE 75 MG PO TABS
75.0000 mg | ORAL_TABLET | Freq: Every day | ORAL | Status: DC
  Administered 2013-02-26 – 2013-03-04 (×7): 75 mg via ORAL
  Filled 2013-02-26 (×9): qty 1

## 2013-02-26 MED ORDER — HYDROMORPHONE HCL PF 1 MG/ML IJ SOLN
0.5000 mg | INTRAMUSCULAR | Status: DC | PRN
  Administered 2013-02-26: 0.5 mg via INTRAVENOUS
  Filled 2013-02-26: qty 1

## 2013-02-26 MED ORDER — DEXTROSE 50 % IV SOLN: 50.0000 mL | Freq: Once | INTRAVENOUS | Status: AC | PRN

## 2013-02-26 MED ORDER — DEXTROSE 50 % IV SOLN
25.0000 mL | Freq: Once | INTRAVENOUS | Status: AC
  Administered 2013-02-26: 25 mL via INTRAVENOUS
  Filled 2013-02-26: qty 50

## 2013-02-26 MED ORDER — ONDANSETRON HCL 4 MG/2ML IJ SOLN
INTRAMUSCULAR | Status: AC
  Filled 2013-02-26: qty 2

## 2013-02-26 MED ORDER — AZATHIOPRINE 50 MG PO TABS
50.0000 mg | ORAL_TABLET | Freq: Two times a day (BID) | ORAL | Status: DC
  Administered 2013-02-26 – 2013-03-04 (×14): 50 mg via ORAL
  Filled 2013-02-26 (×15): qty 1

## 2013-02-26 MED ORDER — HYDROXYCHLOROQUINE SULFATE 200 MG PO TABS
200.0000 mg | ORAL_TABLET | Freq: Two times a day (BID) | ORAL | Status: DC
  Administered 2013-02-26 – 2013-03-04 (×14): 200 mg via ORAL
  Filled 2013-02-26 (×15): qty 1

## 2013-02-26 MED ORDER — PANTOPRAZOLE SODIUM 40 MG IV SOLR
40.0000 mg | Freq: Two times a day (BID) | INTRAVENOUS | Status: DC
  Administered 2013-02-26 – 2013-03-01 (×6): 40 mg via INTRAVENOUS
  Filled 2013-02-26 (×7): qty 40

## 2013-02-26 MED ORDER — FENTANYL CITRATE 0.05 MG/ML IJ SOLN
INTRAMUSCULAR | Status: DC | PRN
  Administered 2013-02-26 (×4): 25 ug via INTRAVENOUS

## 2013-02-26 MED ORDER — ALPRAZOLAM 0.5 MG PO TABS
0.5000 mg | ORAL_TABLET | Freq: Every day | ORAL | Status: DC
  Administered 2013-02-26 – 2013-03-03 (×7): 0.5 mg via ORAL
  Filled 2013-02-26 (×7): qty 1

## 2013-02-26 MED ORDER — INSULIN ASPART 100 UNIT/ML ~~LOC~~ SOLN: 0.0000 [IU] | Freq: Every day | SUBCUTANEOUS | Status: DC

## 2013-02-26 MED ORDER — ASPIRIN EC 81 MG PO TBEC
81.0000 mg | DELAYED_RELEASE_TABLET | Freq: Every day | ORAL | Status: DC
  Administered 2013-02-26 – 2013-03-04 (×7): 81 mg via ORAL
  Filled 2013-02-26 (×7): qty 1

## 2013-02-26 MED ORDER — HEPARIN SODIUM (PORCINE) 5000 UNIT/ML IJ SOLN
5000.0000 [IU] | Freq: Three times a day (TID) | INTRAMUSCULAR | Status: DC
  Administered 2013-02-26 – 2013-03-04 (×18): 5000 [IU] via SUBCUTANEOUS
  Filled 2013-02-26 (×22): qty 1

## 2013-02-26 MED ORDER — CARVEDILOL 3.125 MG PO TABS
3.1250 mg | ORAL_TABLET | Freq: Two times a day (BID) | ORAL | Status: DC
  Administered 2013-02-26 – 2013-03-04 (×13): 3.125 mg via ORAL
  Filled 2013-02-26 (×17): qty 1

## 2013-02-26 MED ORDER — TRAZODONE HCL 100 MG PO TABS
100.0000 mg | ORAL_TABLET | Freq: Every day | ORAL | Status: DC
  Administered 2013-02-26 – 2013-03-03 (×7): 100 mg via ORAL
  Filled 2013-02-26 (×8): qty 1

## 2013-02-26 MED ORDER — POTASSIUM CHLORIDE IN NACL 20-0.9 MEQ/L-% IV SOLN
INTRAVENOUS | Status: DC
  Filled 2013-02-26 (×2): qty 1000

## 2013-02-26 MED ORDER — CITALOPRAM HYDROBROMIDE 20 MG PO TABS
20.0000 mg | ORAL_TABLET | Freq: Every evening | ORAL | Status: DC
  Administered 2013-02-26 – 2013-03-03 (×6): 20 mg via ORAL
  Filled 2013-02-26 (×7): qty 1

## 2013-02-26 MED ORDER — HYDROMORPHONE HCL PF 1 MG/ML IJ SOLN
INTRAMUSCULAR | Status: AC
  Filled 2013-02-26: qty 1

## 2013-02-26 MED ORDER — ONDANSETRON HCL 4 MG/2ML IJ SOLN
INTRAMUSCULAR | Status: DC | PRN
  Administered 2013-02-26: 4 mg via INTRAVENOUS

## 2013-02-26 MED ORDER — HYDROMORPHONE HCL PF 1 MG/ML IJ SOLN: 1.0000 mg | INTRAMUSCULAR | Status: DC | PRN

## 2013-02-26 MED ORDER — SODIUM CHLORIDE 0.9 % IV SOLN: INTRAVENOUS | Status: DC

## 2013-02-26 MED ORDER — FENTANYL CITRATE 0.05 MG/ML IJ SOLN
INTRAMUSCULAR | Status: AC
  Filled 2013-02-26: qty 4

## 2013-02-26 MED ORDER — INSULIN ASPART 100 UNIT/ML ~~LOC~~ SOLN
0.0000 [IU] | SUBCUTANEOUS | Status: DC
  Administered 2013-02-26 (×2): 4 [IU] via SUBCUTANEOUS
  Administered 2013-02-27: 3 [IU] via SUBCUTANEOUS
  Administered 2013-02-27 (×4): 4 [IU] via SUBCUTANEOUS
  Administered 2013-02-28: 3 [IU] via SUBCUTANEOUS

## 2013-02-26 MED ORDER — ISOSORB DINITRATE-HYDRALAZINE 20-37.5 MG PO TABS
1.0000 | ORAL_TABLET | Freq: Three times a day (TID) | ORAL | Status: DC
  Administered 2013-02-26 – 2013-03-04 (×19): 1 via ORAL
  Filled 2013-02-26 (×22): qty 1

## 2013-02-26 MED ORDER — PREDNISONE 5 MG PO TABS: 5.0000 mg | ORAL_TABLET | Freq: Every day | ORAL | Status: DC

## 2013-02-26 MED ORDER — KCL IN DEXTROSE-NACL 20-5-0.9 MEQ/L-%-% IV SOLN
INTRAVENOUS | Status: DC
  Administered 2013-02-26: 02:00:00 via INTRAVENOUS
  Administered 2013-02-26: 1000 mL via INTRAVENOUS
  Administered 2013-02-26 – 2013-02-28 (×4): via INTRAVENOUS
  Administered 2013-02-28: 250 mL via INTRAVENOUS
  Filled 2013-02-26 (×7): qty 1000

## 2013-02-26 MED ORDER — PREDNISONE 5 MG PO TABS
5.0000 mg | ORAL_TABLET | Freq: Every day | ORAL | Status: DC
  Administered 2013-02-26 – 2013-03-04 (×7): 5 mg via ORAL
  Filled 2013-02-26 (×10): qty 1

## 2013-02-26 MED ORDER — INSULIN ASPART 100 UNIT/ML ~~LOC~~ SOLN: 0.0000 [IU] | Freq: Three times a day (TID) | SUBCUTANEOUS | Status: DC

## 2013-02-26 MED ORDER — BUTAMBEN-TETRACAINE-BENZOCAINE 2-2-14 % EX AERO
INHALATION_SPRAY | CUTANEOUS | Status: DC | PRN
  Administered 2013-02-26: 1 via TOPICAL

## 2013-02-26 MED ORDER — CYCLOSPORINE 0.05 % OP EMUL
1.0000 [drp] | Freq: Three times a day (TID) | OPHTHALMIC | Status: DC
  Administered 2013-02-26 – 2013-03-04 (×17): 1 [drp] via OPHTHALMIC
  Filled 2013-02-26 (×26): qty 1

## 2013-02-26 MED ORDER — HYDRALAZINE HCL 20 MG/ML IJ SOLN: 10.0000 mg | Freq: Four times a day (QID) | INTRAMUSCULAR | Status: DC | PRN

## 2013-02-26 MED ORDER — MIDAZOLAM HCL 10 MG/2ML IJ SOLN
INTRAMUSCULAR | Status: DC | PRN
  Administered 2013-02-26: 1 mg via INTRAVENOUS
  Administered 2013-02-26 (×2): 2 mg via INTRAVENOUS

## 2013-02-26 NOTE — Progress Notes (Signed)
Addendum  Patient seen and examined, chart and data base reviewed.  I agree with the above assessment and plan.  For full details please see Mrs. Algis Downs PA note.  She was discharged from the hospital on Saturday after she was evaluated for abdominal pain, nausea/vomiting.  She came in with the same symptoms and burning epigastric abdominal pain.  GI consulted, patient for endoscopy today.   Clint Lipps, MD [facility] Pager: [phone] 02/26/2013, 12:43 PM

## 2013-02-26 NOTE — Consult Note (Signed)
[facility] Consult: 10:04 AM 02/26/2013   Referring Provider: Dr Arthor Captain Primary Care Physician:  Ron Parker, MD Primary Gastroenterologist:  none  Reason for Consultation:  Nausea and vomiting.   HPI: [name] is a morbidly obese, diabetic 42 y.o. female with Sjogren's syndrome, OSA, CHF, Lupus.   Since April 2014 has been having n/v.  Around week 2 of July,  MD added phenergen and reglan but this did not help. She was already on Pepcid BID.  No PPI in use.  sxs worse at night.   Admitted 7/23 - 7/26 with n/v.  Liver lesion, hepatomegaly on CT.  Planned out pt MRI, on table that could accommodate her 391# weight. Her LFTs were normal.  The n/v improved, did not completely resolve.  Readmitted 7/28.  At this point there was slight elevation of AST./ALTs to 60s/50s. These have since gone to 134/107 but otherwise LFTs are normal.  She had the MRI and it shows hemagioma vs adenomal, unlikely to be a solitary mets.  Continues to vomit several times daily and have pain in epigastrium into RUQ. Emesis is yellow and sometimes brown.  No pain radation.  Some chills, no sweats, no fevers.  WBCs as high as 12.4.   At home sugars range from low 100s to 120s, and A1C is 5.6.   She was switched from Lantus insulin to Humalog this past spring and has been better about her diet.  Wt has fluctuated in 10 # range, no stable weight loss. Daily brown BMs.   Unable to exercise. Uses rolling walker since stroke in 2011.  Chronic, stable,  baseline DOE. No dysuria, no bloody nose, no bleeding problems.  No NSAIDs.  Uses prn Tylenol for frequent headaches.  No rash, itching or skin sores.   Family hx sign for stomach cancer in mat aunt, brother.  Colon cancer in mat aunt.       Past Medical History  Diagnosis Date  . IDDM 08/09/2010  . Bell's palsy 08/09/2010  . SLE 08/09/2010  . SJOGREN'S SYNDROME 08/09/2010  . Hypertension   . High cholesterol    . CHF (congestive heart failure)   . Anginal pain   . Shortness of breath     "all the time"  . Obstructive sleep apnea on CPAP 2011  . Migraines   . Stroke 2010    "left side is still weak from it; never fully regained full strength"  . Anxiety   . GERD (gastroesophageal reflux disease)     Past Surgical History  Procedure Laterality Date  . Ectopic pregnancy surgery  1999  . Cardiac catheterization  02/07/12  . Hernia repair  1980's    umbilical  . Epidermoid cyst excision  2007     left breast    Prior to Admission medications   Medication Sig Start Date End Date Taking? Authorizing Provider  acetaminophen (TYLENOL) 500 MG tablet Take 500-1,000 mg by mouth every 6 (six) hours as needed. For pain.   Yes Historical Provider, MD  albuterol (PROAIR HFA) 108 (90 BASE) MCG/ACT inhaler Inhale 2 puffs into the lungs every 6 (six) hours as needed. For shortness of breath/wheeze   Yes Historical Provider, MD  alendronate (FOSAMAX) 70 MG tablet Take 70 mg by mouth every 7 (seven) days. thursdays. Take with a full glass of water on an empty stomach.   Yes Historical Provider, MD  ALPRAZolam Prudy Feeler) 0.5 MG tablet Take 0.5 mg by mouth at bedtime.   Yes Historical Provider, MD  aspirin EC 81 MG tablet Take 81 mg by mouth daily.   Yes Historical Provider, MD  azaTHIOprine (IMURAN) 50 MG tablet Take 50 mg by mouth 2 (two) times daily.    Yes Historical Provider, MD  buPROPion (WELLBUTRIN SR) 150 MG 12 hr tablet Take 150 mg by mouth 2 (two) times daily.   Yes Historical Provider, MD  carvedilol (COREG) 3.125 MG tablet Take 3.125 mg by mouth 2 (two) times daily with a meal.   Yes Historical Provider, MD  cetirizine (ZYRTEC) 10 MG tablet Take 10 mg by mouth daily.   Yes Historical Provider, MD  citalopram (CELEXA) 20 MG tablet Take 20 mg by mouth every evening.   Yes Historical Provider, MD  clopidogrel (PLAVIX) 75 MG tablet Take 75 mg by mouth daily.   Yes Historical Provider, MD  cycloSPORINE  (RESTASIS) 0.05 % ophthalmic emulsion Place 1 drop into both eyes 3 (three) times daily.   Yes Historical Provider, MD  famotidine (PEPCID) 20 MG tablet Take 20 mg by mouth 2 (two) times daily.   Yes Historical Provider, MD  gabapentin (NEURONTIN) 300 MG capsule Take 300 mg by mouth 2 (two) times daily.    Yes Historical Provider, MD  hydroxychloroquine (PLAQUENIL) 200 MG tablet Take 200 mg by mouth 2 (two) times daily.    Yes Historical Provider, MD  insulin regular human CONCENTRATED (HUMULIN R) 500 UNIT/ML SOLN injection Inject 150-175 Units into the skin 3 (three) times daily. 3x a day (just before each meal) 175units in the morning-150 units with lunch-125 units at dinner 02/22/12  Yes Romero Belling, MD  isosorbide-hydrALAZINE (BIDIL) 20-37.5 MG per tablet Take 1 tablet by mouth 3 (three) times daily.   Yes Historical Provider, MD  losartan (COZAAR) 100 MG tablet Take 50 mg by mouth daily.    Yes Historical Provider, MD  potassium chloride SA (K-DUR,KLOR-CON) 20 MEQ tablet Take 20 mEq by mouth 3 (three) times daily.    Yes Historical Provider, MD  pravastatin (PRAVACHOL) 10 MG tablet Take 10 mg by mouth at bedtime.     Yes Historical Provider, MD  torsemide (DEMADEX) 10 MG tablet Take 10 mg by mouth 3 (three) times daily.    Yes Historical Provider, MD  traZODone (DESYREL) 100 MG tablet Take 100 mg by mouth at bedtime.   Yes Historical Provider, MD    Scheduled Meds: . ALPRAZolam  0.5 mg Oral QHS  . aspirin EC  81 mg Oral Daily  . azaTHIOprine  50 mg Oral BID  . carvedilol  3.125 mg Oral BID WC  . citalopram  20 mg Oral QPM  . clopidogrel  75 mg Oral Q breakfast  . cycloSPORINE  1 drop Both Eyes TID  . gabapentin  300 mg Oral BID  . heparin  5,000 Units Subcutaneous Q8H  . hydroxychloroquine  200 mg Oral BID  . insulin aspart  0-20 Units Subcutaneous Q4H  . isosorbide-hydrALAZINE  1 tablet Oral TID  . losartan  50 mg Oral Daily  . pantoprazole (PROTONIX) IV  40 mg Intravenous Q24H  .  traZODone  100 mg Oral QHS   Infusions: . dextrose 5 % and 0.9 % NaCl with KCl 20 mEq/L 100 mL/hr at 02/26/13 0226  . dextrose 5 % and 0.45% NaCl Stopped (02/26/13 0226)   PRN Meds: albuterol, dextrose, dextrose, hydrALAZINE, HYDROmorphone (DILAUDID) injection, LORazepam, ondansetron (ZOFRAN) IV, ondansetron   Allergies as of 02/25/2013 - Review Complete 02/25/2013  Allergen Reaction Noted  . Oxycodone-acetaminophen Hives 08/09/2010  Family History  Problem Relation Age of Onset  . Diabetes Mother   . Hypertension Other   . Stroke Other   . Arthritis Other     History   Social History  . Marital Status: Single    Spouse Name: N/A    Number of Children: N/A  . Years of Education: N/A   Occupational History  .      student   Social History Main Topics  . Smoking status: Former Smoker -- 3 years    Types: Cigarettes    Quit date: 06/01/1990  . Smokeless tobacco: Never Used  . Alcohol Use: No  . Drug Use: No  . Sexually Active: No   Other Topics Concern  . Not on file   Social History Narrative   Regular exercise-yes    REVIEW OF SYSTEMS: See HPI for results of 12 system review  PHYSICAL EXAM: Vital signs in last 24 hours: Temp:  [98.2 F (36.8 C)-99.9 F (37.7 C)] 98.2 F (36.8 C) (07/29 0806) Pulse Rate:  [84-100] 99 (07/29 0806) Resp:  [14-28] 18 (07/29 0806) BP: (118-159)/(62-94) 153/86 mmHg (07/29 0806) SpO2:  [99 %-100 %] 99 % (07/29 0806) Weight:  [177.356 kg (391 lb)-178.9 kg (394 lb 6.5 oz)] 178.9 kg (394 lb 6.5 oz) (07/29 0107)  General: morbidly obese, somnolent after meds Head:  No asymetry or trauma signs  Eyes:  No icterus or conj pallor Ears:  Not HOH  Nose:  No congestion or sneezing Mouth:  Non-specific white coating in mouth Neck:  No mass, no JVD, no bruits Lungs:  Clear bil.  Unlabored breathing Heart: RRR.  No MRG Abdomen:  Soft, obese, tender across upper abdomen worse at epigastrium..   Rectal: deferred   Musc/Skeltl:  no joint deformity or swelling Extremities:  No pedal edem  Neurologic:  Pleasant, drowsy but arouseable.  Oriented x 3. No tremor.  No limb weakness.    Skin:  No rash, no sores,  Tattoos:  none Nodes:  No cervical adenopathy   Psych:  Pleasant, oriented x3.  Relaxed, appropriate.   Intake/Output from previous day: 07/28 0701 - 07/29 0700 In: 741.3 [I.V.:741.3] Out: -  Intake/Output this shift:    LAB RESULTS:  Recent Labs  02/25/13 1918 02/26/13 0430  WBC 12.4* 9.2  HGB 11.0* 9.5*  HCT 32.3* 29.5*  PLT 299 257   BMET Lab Results  Component Value Date   NA 139 02/26/2013   NA 139 02/25/2013   NA 140 02/23/2013   K 3.8 02/26/2013   K 3.3* 02/25/2013   K 3.7 02/23/2013   CL 105 02/26/2013   CL 103 02/25/2013   CL 103 02/23/2013   CO2 23 02/26/2013   CO2 26 02/25/2013   CO2 29 02/23/2013   GLUCOSE 102* 02/26/2013   GLUCOSE 42* 02/25/2013   GLUCOSE 138* 02/23/2013   BUN 7 02/26/2013   BUN 8 02/25/2013   BUN 6 02/23/2013   CREATININE 1.04 02/26/2013   CREATININE 0.96 02/25/2013   CREATININE 1.20* 02/23/2013   CALCIUM 8.8 02/26/2013   CALCIUM 9.4 02/25/2013   CALCIUM 9.0 02/23/2013   LFT  Recent Labs  02/25/13 2144 02/26/13 0430  PROT 7.1 6.1  ALBUMIN 2.9* 2.6*  AST 64* 134*  ALT 56* 107*  ALKPHOS 77 76  BILITOT 0.2* 0.3  BILIDIR <0.1  --   IBILI NOT CALCULATED  --    PT/INR Lab Results  Component Value Date   INR 1.05 02/26/2013   INR  1.06 02/20/2013   INR 0.97 10/09/2012   Hepatitis Panel No results found for this basename: HEPBSAG, HCVAB, HEPAIGM, HEPBIGM,  in the last 72 hours C-Diff No components found with this basename: cdiff    Drugs of Abuse  No results found for this basename: labopia,  cocainscrnur,  labbenz,  amphetmu,  thcu,  labbarb     RADIOLOGY STUDIES: Dg Chest 2 View 02/25/2013     Findings: Lungs clear.  Heart is borderline enlarged with normal pulmonary vascularity.  No adenopathy.  No bone lesions.  IMPRESSION: No edema or consolidation.   Heart borderline enlarged.   Original Report Authenticated By: Bretta Bang, M.D.   Ultrasound Abdomen 02/20/13 Conclusion: Mass-like area in the left lobe of the liver. This  findi ng warrants further evaluation. MRI with and without  intravenous contrast would be the imaging study of choice to  further assess.  Study otherwise unremarkable.   CT abdomen/Pelvis.  02/20/13 IMPRESSION:  1. Right hepatic lobe mass is poorly characterized on today's  exam, for reasons given above. Differential diagnosis is broad and  includes abscess, hemangioma and neoplasm. MR abdomen without and  with contrast (preferably with Eovist) is recommended.  2. Hepatomegaly.   Mr Abdomen W Wo Contrast 02/25/2013   Comparison: CT of the abdomen and pelvis 02/20/2013.  Comment:  Study is limited by considerable patient motion, particularly on the postcontrast images, which affects both have their quality, as well as the fidelity of the subtraction images.  Findings: Within the central aspect of the liver, predominantly involving segments 4B and 5, there is a 7.1 x 7.6 x 6.4 cm lesion and is heterogeneous in signal intensity on pregadolinium T1 and T2- weighted images.  The periphery of the lesion is predominantly T1 hyperintense and T2 hypointense, while the central aspect of the lesion is heterogeneously T1 hypointense and generally T2 hyperintense.  Post gadolinium imaging is limited by considerable patient motion which limits accurate assessment of internal enhancement.  There does appear to be some heterogeneous internal enhancement which is predominately peripheral and nodular, with some progressive enhancement centrally.  Delayed post gadolinium images obtained at 20 minutes demonstrates accumulation of the contrast within the liver parenchyma around the lesion, but there is relative paucity of contrast within the lesion, indicative of a lesion of non hepatocyte origin.  Within the visualized portions of the  abdomen, the appearance of the gallbladder, pancreas, spleen, bilateral adrenal glands and bilateral kidneys is unremarkable.  IMPRESSION: 1.  7.1 x 7.6 x 6.4 cm lesion in segment 4B and 5 of the liver has unusual imaging characteristics, detailed above.  Given the lack of accumulation of contrast agent on delayed phase imaging with Eovist, which indicates a lesion not of hepatocellular origin, differential considerations primarily include an atypical giant cavernous hemangioma, atypical adenoma with central necrosis, or less likely a large solitary metastasis with central necrosis. Lack of enhancement on the delayed images with Eovist indicates that this is not a large focal nodular hyperplasia (FNH).  At this time, a short term follow-up MRI with Multihance (not Eovist) in 2- 3 months is recommended to assess for size stability, and to better evaluate the dynamic post-contrast enhancement characteristics of this lesion.  Original Report Authenticated By: Trudie Reed, M.D.   Acute Abdominal Series 02/26/2013   Findings: There is no free air or free fluid in the abdomen.  The bowel gas pattern is normal with only a small amount of air in the bowel.  There is hepatomegaly.  Chronic cardiomegaly.  Slight pulmonary vascular prominence.  IMPRESSION: No evidence of bowel obstruction or other acute abnormality in the abdomen.  Hepatomegaly.  Chronic cardiomegaly.  Slight pulmonary vascular congestion.   Original Report Authenticated By: Francene Boyers, M.D.    ENDOSCOPIC STUDIES: None ever  IMPRESSION: *  Several months of n/v.  Now with epigastric pain. Rule out GERD, ulcers, gastroparesis, GB disease (despite lack of GB pathology on the imaging studies) *  Hepatic lesion. Rule out atypical hemangioma, rule out atypical adenoma.   *  Hepatomegaly.  Note bump in transaminases this admission.  *  IDDM. Well controlled.  A1c 7/21 was 5.6.  *  Normocytic anemia. Chronic, baseline Hgb is 10.5 to 11.0 *   Morbid obesity *  OSA.  On CPAP at night.  *  Hx CVA.  On plavix, 81 mg ASA *  Hx CHF, "chronic".  LVEF 09/2012 on echo was 55 to 60% "normal systolic function". *  Sjogren's syndrome, lupus.  On Imuran, Plaquenil, at home.   PLAN: *  EGD today.   LOS: 1 day   Jennye Moccasin  02/26/2013, 10:04 AM Pager: [phone]  GI ATTENDING  PATIENT SEEN AND EXAMINED PERSONALLY. LABS AND X-RAYS REVIEWED. AGREE WITH ABOVE AS OUTLINED. UNFORTUNATE 42 YO WITH MULTIPLE PROBLEMS AS OUTLINED. ASKED TO SEE FOR N/V AND PAIN. WILL START WITH EGD TO R/O MUCOSAL LESION. PCP SHOULD FOLLOW UP LIVER WITH REPEAT MRI AS RECOMMENDED.  Wilhemina Bonito. Eda Keys., M.D. [facility] Division of Gastroenterology

## 2013-02-26 NOTE — Progress Notes (Signed)
[facility] PROGRESS NOTE  [name] ZOX:[id number] DOB: Apr 21, 1971 DOA: 02/25/2013 PCP: Ron Parker, MD  Assessment/Plan:  Abdominal pain and vomiting Patient still with burning epigastric and RUQ pain unable to tolerate POs.    Supportive care with IVF, pain medications, anti-emetics Appreciate [facility] consultation Upper Endoscopy 7/29 pm. Pending Endo results will consider Gastric Emptying Scan, hydascan  Hepatic Mass 7.1 x 7.6 x 6.4 cm as noted on MRI Liver 7/27.  Felt to be benign by radiologist. Recommended follow up in 2 - 3 months with repeat MRI Vaginal estrogen ring removed. [facility] consulted for any further recommendations regarding work up.  IDDM Hypoglycemic on admission. Recently on U500.   Last admission CBGs did not indicate she needed U500. Currently on q 4 hour SSI-Resistant while NPO and vomiting. Will make recommendations at discharge & discuss with Dr. Everardo All  Lactic Acidosis Secondary to vomiting Resolved with IVF  Hypertension Hydralazine IV PRN while vomiting. Continue Bidil, Cozaar, Coreg when able.  SLE / Sjogren's Continue Imuran, Plaquenil, Prednisone when able Follows with Dr. Lanell Matar at [facility]   H/O Stroke  Continue Plavix  Anxiety Xanax QHS Celexa, Wellbutrin, Trazodone   DVT Prophylaxis:  heparin  Code Status: full Family Communication:  Disposition Plan: inpatient   Consultants:  GI  Procedures:  EGD pending  Antibiotics:    HPI/Subjective:  [name] is a 42 y.o. female with Past medical history of COPD, CHF, Hypertension, oxygen dependant, SLE, CVA who presents today with on-going vomiting that is not getting better. She has been recently admitted with similar complain and has undergone thourough work up including MRI of the abdomen that has revealed a large hepatic mass and was planing to follow it up with her PCP. Now she returns with worsening of her symptoms of nausea and vomiting and  dehydration. She has diffuse right upper quadrant pain that started today again as well. It is a sharp pain worsening with movement.  Objective: Filed Vitals:   02/26/13 0045 02/26/13 0107 02/26/13 0439 02/26/13 0806  BP:  121/75 123/76 153/86  Pulse:  84 91 99  Temp:  98.2 F (36.8 C) 98.2 F (36.8 C) 98.2 F (36.8 C)  TempSrc:  Oral Oral Oral  Resp:  16 16 18   Height: 5\' 8"  (1.727 m)     Weight:  178.9 kg (394 lb 6.5 oz)    SpO2:  99% 100% 99%    Intake/Output Summary (Last 24 hours) at 02/26/13 1231 Last data filed at 02/26/13 0900  Gross per 24 hour  Intake 741.25 ml  Output      0 ml  Net 741.25 ml   Filed Weights   02/25/13 1915 02/26/13 0107  Weight: 177.356 kg (391 lb) 178.9 kg (394 lb 6.5 oz)    Exam:   General:  A&O, in moderate distress, ill appearing, continuously rocking, hand tremulous.  Cardiovascular: RRR, no murmurs, rubs or gallops, no lower extremity edema  Respiratory: CTA, no wheeze, crackles, or rales.  No increased work of breathing.  Abdomen: massive, Soft, tender to palpation in the left and right upper quadrants, non-distended, significantly decreased bowel sounds, no masses  Musculoskeletal: Able to move all 4 extremities, 5/5 strength in each  Data Reviewed: Basic Metabolic Panel:  Recent Labs Lab 02/21/13 0550 02/22/13 0638 02/23/13 0519 02/25/13 1918 02/26/13 0430  NA 143 139 140 139 139  K 3.7 3.4* 3.7 3.3* 3.8  CL 107 103 103 103 105  CO2 27 23 29 26  23  GLUCOSE 134* 136* 138* 42* 102*  BUN 8 7 6 8 7   CREATININE 1.30* 1.15* 1.20* 0.96 1.04  CALCIUM 8.8 8.7 9.0 9.4 8.8   Liver Function Tests:  Recent Labs Lab 02/20/13 0443 02/21/13 0550 02/22/13 0638 02/25/13 2144 02/26/13 0430  AST 24 25 28  64* 134*  ALT 17 24 33 56* 107*  ALKPHOS 82 80 81 77 76  BILITOT 0.3 0.4 0.3 0.2* 0.3  PROT 6.9 6.7 6.6 7.1 6.1  ALBUMIN 3.0* 2.9* 2.9* 2.9* 2.6*    Recent Labs Lab 02/20/13 0443 02/25/13 2144  LIPASE 22 20   CBC:  Recent Labs Lab 02/20/13 0443 02/20/13 1431 02/21/13 0550 02/22/13 0638 02/25/13 1918 02/26/13 0430  WBC 10.7* 9.9 9.2 9.2 12.4* 9.2  NEUTROABS 6.8  --   --   --   --   --   HGB 10.5* 10.8* 10.0* 10.4* 11.0* 9.5*  HCT 31.6* 32.7* 31.2* 31.3* 32.3* 29.5*  MCV 82.5 83.0 84.1 82.8 81.8 83.1  PLT 343 346 312 291 299 257   Cardiac Enzymes:  Recent Labs Lab 02/20/13 0443 02/26/13 0843  TROPONINI <0.30 <0.30   BNP (last 3 results)  Recent Labs  02/18/13 1806 02/20/13 0443 02/25/13 1918  PROBNP 487.5* 397.9* 162.5*   CBG:  Recent Labs Lab 02/25/13 2341 02/26/13 0109 02/26/13 0448 02/26/13 0756 02/26/13 1211  GLUCAP 55* 78 108* 115* 156*    Recent Results (from the past 240 hour(s))  URINE CULTURE     Status: None   Collection Time    02/18/13  7:27 PM      Result Value Range Status   Specimen Description URINE, CLEAN CATCH   Final   Special Requests NONE   Final   Culture  Setup Time 02/18/2013 21:03   Final   Colony Count NO GROWTH   Final   Culture NO GROWTH   Final   Report Status 02/20/2013 FINAL   Final  URINE CULTURE     Status: None   Collection Time    02/20/13  8:01 PM      Result Value Range Status   Specimen Description URINE, RANDOM   Final   Special Requests NONE   Final   Culture  Setup Time 02/21/2013 02:32   Final   Colony Count NO GROWTH   Final   Culture NO GROWTH   Final   Report Status 02/21/2013 FINAL   Final     Studies: Dg Chest 2 View  02/25/2013   *RADIOLOGY REPORT*  Clinical Data: Shortness of breath  CHEST - 2 VIEW  Comparison: February 20, 2013  Findings: Lungs clear.  Heart is borderline enlarged with normal pulmonary vascularity.  No adenopathy.  No bone lesions.  IMPRESSION: No edema or consolidation.  Heart borderline enlarged.   Original Report Authenticated By: Bretta Bang, M.D.   Mr Abdomen W Wo Contrast  02/25/2013   **ADDENDUM** CREATED: 02/25/2013 09:05:20  These findings and recommendations were called by  telephone on 02/25/2013 at 09:00 AM to PA [facility], who verbally acknowledged these results.  **END ADDENDUM** SIGNED BY: Florencia Reasons, M.D.  02/25/2013   *RADIOLOGY REPORT*  Clinical Data: Follow-up evaluation of liver lesions seen on CT examination.  MRI ABDOMEN WITH AND WITHOUT CONTRAST  Technique:  Multiplanar multisequence MR imaging of the abdomen was performed both before and after administration of intravenous contrast.  Contrast:  10 ml Eovist  Comparison: CT of the abdomen and pelvis 02/20/2013.  Comment:  Study is  limited by considerable patient motion, particularly on the postcontrast images, which affects both have their quality, as well as the fidelity of the subtraction images.  Findings: Within the central aspect of the liver, predominantly involving segments 4B and 5, there is a 7.1 x 7.6 x 6.4 cm lesion and is heterogeneous in signal intensity on pregadolinium T1 and T2- weighted images.  The periphery of the lesion is predominantly T1 hyperintense and T2 hypointense, while the central aspect of the lesion is heterogeneously T1 hypointense and generally T2 hyperintense.  Post gadolinium imaging is limited by considerable patient motion which limits accurate assessment of internal enhancement.  There does appear to be some heterogeneous internal enhancement which is predominately peripheral and nodular, with some progressive enhancement centrally.  Delayed post gadolinium images obtained at 20 minutes demonstrates accumulation of the contrast within the liver parenchyma around the lesion, but there is relative paucity of contrast within the lesion, indicative of a lesion of non hepatocyte origin.  Within the visualized portions of the abdomen, the appearance of the gallbladder, pancreas, spleen, bilateral adrenal glands and bilateral kidneys is unremarkable.  IMPRESSION: 1.  7.1 x 7.6 x 6.4 cm lesion in segment 4B and 5 of the liver has unusual imaging characteristics, detailed above.   Given the lack of accumulation of contrast agent on delayed phase imaging with Eovist, which indicates a lesion not of hepatocellular origin, differential considerations primarily include an atypical giant cavernous hemangioma, atypical adenoma with central necrosis, or less likely a large solitary metastasis with central necrosis. Lack of enhancement on the delayed images with Eovist indicates that this is not a large focal nodular hyperplasia (FNH).  At this time, a short term follow-up MRI with Multihance (not Eovist) in 2- 3 months is recommended to assess for size stability, and to better evaluate the dynamic post-contrast enhancement characteristics of this lesion.  Original Report Authenticated By: Trudie Reed, M.D.   Acute Abdominal Series  02/26/2013   *RADIOLOGY REPORT*  Clinical Data: Abdominal pain.  Nausea and vomiting.  ACUTE ABDOMEN SERIES (ABDOMEN 2 VIEW & CHEST 1 VIEW)  Comparison: Chest x-ray dated 02/25/2013 and abdominal radiographs dated 10/11/2012 and CT scan dated 02/20/2013  Findings: There is no free air or free fluid in the abdomen.  The bowel gas pattern is normal with only a small amount of air in the bowel.  There is hepatomegaly.  Chronic cardiomegaly.  Slight pulmonary vascular prominence.  IMPRESSION: No evidence of bowel obstruction or other acute abnormality in the abdomen.  Hepatomegaly.  Chronic cardiomegaly.  Slight pulmonary vascular congestion.   Original Report Authenticated By: Francene Boyers, M.D.    Scheduled Meds: . ALPRAZolam  0.5 mg Oral QHS  . aspirin EC  81 mg Oral Daily  . azaTHIOprine  50 mg Oral BID  . carvedilol  3.125 mg Oral BID WC  . citalopram  20 mg Oral QPM  . clopidogrel  75 mg Oral Q breakfast  . cycloSPORINE  1 drop Both Eyes TID  . gabapentin  300 mg Oral BID  . heparin  5,000 Units Subcutaneous Q8H  . hydroxychloroquine  200 mg Oral BID  . insulin aspart  0-20 Units Subcutaneous Q4H  . isosorbide-hydrALAZINE  1 tablet Oral TID  .  losartan  50 mg Oral Daily  . pantoprazole (PROTONIX) IV  40 mg Intravenous Q24H  . predniSONE  5 mg Oral Q breakfast  . traZODone  100 mg Oral QHS   Continuous Infusions: . dextrose 5 % and  0.9 % NaCl with KCl 20 mEq/L 100 mL/hr at 02/26/13 0226  . dextrose 5 % and 0.45% NaCl Stopped (02/26/13 0226)    Principal Problem:   Intractable vomiting Active Problems:   IDDM   Chronic respiratory failure   Abdominal  pain, other specified site   Lactic acidosis   Hypoglycemia    Conley Canal  [facility] Pager [phone]. If 7PM-7AM, please contact night-coverage at [email or web address], password [facility] 02/26/2013, 12:31 PM  LOS: 1 day

## 2013-02-26 NOTE — Progress Notes (Signed)
Inpatient Diabetes Program Recommendations  AACE/ADA: New Consensus Statement on Inpatient Glycemic Control (2013)  Target Ranges:  Prepandial:   less than 140 mg/dL      Peak postprandial:   less than 180 mg/dL (1-2 hours)      Critically ill patients:  140 - 180 mg/dL  Results for JENNY, LAI (MRN [id number]) as of 02/26/2013 11:21  Ref. Range 02/25/2013 19:18 02/26/2013 04:30  Glucose Latest Range: 70-99 mg/dL 42 (LL) 086 (H)   Results for ADHYA, COCCO (MRN [id number]) as of 02/26/2013 11:21  Ref. Range 02/25/2013 21:43 02/25/2013 23:41 02/26/2013 01:09 02/26/2013 04:48 02/26/2013 07:56  Glucose-Capillary Latest Range: 70-99 mg/dL 46 (L) 55 (L) 78 528 (H) 115 (H)    Note:  Initial lab glucose noted to be 42 mg/dl on 11/12/22 @ 40:10.  Patient has a history of diabetes and according to the home medication list she takes Humulin R U-500 175 units with breakfast, 150 units with lunch, and 125 units with supper as an outpatient for diabetes management (manged by Dr. Everardo All).  Patient is currently not ordered to receive Humulin R U-500 as an inpatient.   Currently ordered Novolog 0-20 units Q4H for inpatient glycemic control and blood glucose appears to be stable at this time.  Due to issues with nausea, vomiting, inability to tolerate food and/or fluids, recent liver mass findings, and presenting hypoglycemia, recommend MD review and revise home insulin dosages at time of discharge.  Best practice would be to confer with patient's endocrinologist (Dr. Everardo All) to determine recommendations.  Will continue to follow as an inpatient.  Thanks, Orlando Penner, RN, MSN, CCRN Diabetes Coordinator Inpatient Diabetes Program [phone]

## 2013-02-26 NOTE — H&P (Signed)
[facility] History and Physical  [name]  UJW:[id number]  DOB: 1971-01-07  DOA: 02/25/2013  Referring physician: Dr Gary Fleet PCP: Ron Parker, MD   Chief Complaint: intractable vomiting.  HPI: [name] is a 42 y.o. female with Past medical history of COPD, CHF, Hypertension, oxygen dependant, SLE, CVA  Present today with vomiting that is not getting better. She has been recently admitted with similar complain and has undergone thourough work up including MRI of the abdomen that has revealed a large hepatic mass and was planing to follow it up with her PCP. Now she returns with worsening of her symptoms of nausea and vomiting and dehydration. She has diffuse right upper quadrant pain that started today again as well. It is a sharp pain worsening with movement. After recurrent vomiting she had some chest tightness as well, that resolved. Pt denies any blood in her vomitus.  Review of Systems: as mentioned in the history of present illness.  A Comprehensive review of the other systems is negative.  Past Medical History  Diagnosis Date  . IDDM 08/09/2010  . Bell's palsy 08/09/2010  . SLE 08/09/2010  . SJOGREN'S SYNDROME 08/09/2010  . Hypertension   . High cholesterol   . CHF (congestive heart failure)   . Anginal pain   . Shortness of breath     "all the time"  . Obstructive sleep apnea on CPAP 2011  . Migraines   . Stroke 2010    "left side is still weak from it; never fully regained full strength"  . Anxiety   . GERD (gastroesophageal reflux disease)    Past Surgical History  Procedure Laterality Date  . Ectopic pregnancy surgery  1999  . Cardiac catheterization  02/07/12  . Hernia repair  1980's    umbilical   Social History:  reports that she quit smoking about 22 years ago. Her smoking use included Cigarettes. She smoked 0.00 packs per day for 3 years. She has never used smokeless tobacco. She reports that she does not drink alcohol or use illicit  drugs. Patient is coming from home. Patient can participate in ADLs.  Allergies  Allergen Reactions  . Oxycodone-Acetaminophen Hives    Family History  Problem Relation Age of Onset  . Diabetes Mother   . Hypertension Other   . Stroke Other   . Arthritis Other     Prior to Admission medications   Medication Sig Start Date End Date Taking? Authorizing Provider  acetaminophen (TYLENOL) 500 MG tablet Take 500-1,000 mg by mouth every 6 (six) hours as needed. For pain.   Yes Historical Provider, MD  albuterol (PROAIR HFA) 108 (90 BASE) MCG/ACT inhaler Inhale 2 puffs into the lungs every 6 (six) hours as needed. For shortness of breath/wheeze   Yes Historical Provider, MD  alendronate (FOSAMAX) 70 MG tablet Take 70 mg by mouth every 7 (seven) days. thursdays. Take with a full glass of water on an empty stomach.   Yes Historical Provider, MD  ALPRAZolam Prudy Feeler) 0.5 MG tablet Take 0.5 mg by mouth at bedtime.   Yes Historical Provider, MD  aspirin EC 81 MG tablet Take 81 mg by mouth daily.   Yes Historical Provider, MD  azaTHIOprine (IMURAN) 50 MG tablet Take 50 mg by mouth 2 (two) times daily.    Yes Historical Provider, MD  buPROPion (WELLBUTRIN SR) 150 MG 12 hr tablet Take 150 mg by mouth 2 (two) times daily.   Yes Historical Provider, MD  carvedilol (COREG) 3.125 MG tablet Take  3.125 mg by mouth 2 (two) times daily with a meal.   Yes Historical Provider, MD  cetirizine (ZYRTEC) 10 MG tablet Take 10 mg by mouth daily.   Yes Historical Provider, MD  citalopram (CELEXA) 20 MG tablet Take 20 mg by mouth every evening.   Yes Historical Provider, MD  clopidogrel (PLAVIX) 75 MG tablet Take 75 mg by mouth daily.   Yes Historical Provider, MD  cycloSPORINE (RESTASIS) 0.05 % ophthalmic emulsion Place 1 drop into both eyes 3 (three) times daily.   Yes Historical Provider, MD  famotidine (PEPCID) 20 MG tablet Take 20 mg by mouth 2 (two) times daily.   Yes Historical Provider, MD  gabapentin  (NEURONTIN) 300 MG capsule Take 300 mg by mouth 2 (two) times daily.    Yes Historical Provider, MD  hydroxychloroquine (PLAQUENIL) 200 MG tablet Take 200 mg by mouth 2 (two) times daily.    Yes Historical Provider, MD  insulin regular human CONCENTRATED (HUMULIN R) 500 UNIT/ML SOLN injection Inject 150-175 Units into the skin 3 (three) times daily. 3x a day (just before each meal) 175units in the morning-150 units with lunch-125 units at dinner 02/22/12  Yes Romero Belling, MD  isosorbide-hydrALAZINE (BIDIL) 20-37.5 MG per tablet Take 1 tablet by mouth 3 (three) times daily.   Yes Historical Provider, MD  losartan (COZAAR) 100 MG tablet Take 50 mg by mouth daily.    Yes Historical Provider, MD  potassium chloride SA (K-DUR,KLOR-CON) 20 MEQ tablet Take 20 mEq by mouth 3 (three) times daily.    Yes Historical Provider, MD  pravastatin (PRAVACHOL) 10 MG tablet Take 10 mg by mouth at bedtime.     Yes Historical Provider, MD  torsemide (DEMADEX) 10 MG tablet Take 10 mg by mouth 3 (three) times daily.    Yes Historical Provider, MD  traZODone (DESYREL) 100 MG tablet Take 100 mg by mouth at bedtime.   Yes Historical Provider, MD    Physical Exam: Filed Vitals:   02/26/13 0000 02/26/13 0045 02/26/13 0107 02/26/13 0439  BP: 127/66  121/75 123/76  Pulse: 91  84 91  Temp:   98.2 F (36.8 C) 98.2 F (36.8 C)  TempSrc:   Oral Oral  Resp: 18  16 16   Height:  5\' 8"  (1.727 m)    Weight:   178.9 kg (394 lb 6.5 oz)   SpO2: 100%  99% 100%    General: Alert, Awake and Oriented to Time, Place and Person. Appear in severe distress Eyes: PERRL ENT: Oral Mucosa clear dry. Neck: no JVD, no Carotid Bruits, no Stiffness Cardiovascular: S1 and S2 Present, Murmur no, Peripheral Pulses Present Respiratory: Clear to Auscultation, Bilateral Air entry equal and Decreased, Abdomen: Bowel Sound Present, Soft and diffuse tender,mild hepatomegaly Skin: no decubitus Ulcer, Extremities: Pedal edema bilateral, calf  tenderness no. Neurologic: Mental status, Motor strength, Sensation, reflexes, Proprioception Grossly Unremarkable.  Labs on Admission:  Basic Metabolic Panel:  Recent Labs Lab 02/21/13 0550 02/22/13 0638 02/23/13 0519 02/25/13 1918 02/26/13 0430  NA 143 139 140 139 139  K 3.7 3.4* 3.7 3.3* 3.8  CL 107 103 103 103 105  CO2 27 23 29 26 23   GLUCOSE 134* 136* 138* 42* 102*  BUN 8 7 6 8 7   CREATININE 1.30* 1.15* 1.20* 0.96 1.04  CALCIUM 8.8 8.7 9.0 9.4 8.8   Liver Function Tests:  Recent Labs Lab 02/20/13 0443 02/21/13 0550 02/22/13 0638 02/25/13 2144 02/26/13 0430  AST 24 25 28  64* 134*  ALT  17 24 33 56* 107*  ALKPHOS 82 80 81 77 76  BILITOT 0.3 0.4 0.3 0.2* 0.3  PROT 6.9 6.7 6.6 7.1 6.1  ALBUMIN 3.0* 2.9* 2.9* 2.9* 2.6*    Recent Labs Lab 02/20/13 0443 02/25/13 2144  LIPASE 22 20   No results found for this basename: AMMONIA,  in the last 168 hours CBC:  Recent Labs Lab 02/20/13 0443 02/20/13 1431 02/21/13 0550 02/22/13 0638 02/25/13 1918 02/26/13 0430  WBC 10.7* 9.9 9.2 9.2 12.4* 9.2  NEUTROABS 6.8  --   --   --   --   --   HGB 10.5* 10.8* 10.0* 10.4* 11.0* 9.5*  HCT 31.6* 32.7* 31.2* 31.3* 32.3* 29.5*  MCV 82.5 83.0 84.1 82.8 81.8 83.1  PLT 343 346 312 291 299 257   Cardiac Enzymes:  Recent Labs Lab 02/20/13 0443  TROPONINI <0.30    BNP (last 3 results)  Recent Labs  02/18/13 1806 02/20/13 0443 02/25/13 1918  PROBNP 487.5* 397.9* 162.5*   CBG:  Recent Labs Lab 02/23/13 1155 02/25/13 2143 02/25/13 2341 02/26/13 0109 02/26/13 0448  GLUCAP 146* 46* 55* 78 108*    Radiological Exams on Admission: Dg Chest 2 View  02/25/2013   *RADIOLOGY REPORT*  Clinical Data: Shortness of breath  CHEST - 2 VIEW  Comparison: February 20, 2013  Findings: Lungs clear.  Heart is borderline enlarged with normal pulmonary vascularity.  No adenopathy.  No bone lesions.  IMPRESSION: No edema or consolidation.  Heart borderline enlarged.   Original Report  Authenticated By: Bretta Bang, M.D.   Mr Abdomen W Wo Contrast  02/25/2013   **ADDENDUM** CREATED: 02/25/2013 09:05:20  These findings and recommendations were called by telephone on 02/25/2013 at 09:00 AM to PA [facility], who verbally acknowledged these results.  **END ADDENDUM** SIGNED BY: Florencia Reasons, M.D.  02/25/2013   *RADIOLOGY REPORT*  Clinical Data: Follow-up evaluation of liver lesions seen on CT examination.  MRI ABDOMEN WITH AND WITHOUT CONTRAST  Technique:  Multiplanar multisequence MR imaging of the abdomen was performed both before and after administration of intravenous contrast.  Contrast:  10 ml Eovist  Comparison: CT of the abdomen and pelvis 02/20/2013.  Comment:  Study is limited by considerable patient motion, particularly on the postcontrast images, which affects both have their quality, as well as the fidelity of the subtraction images.  Findings: Within the central aspect of the liver, predominantly involving segments 4B and 5, there is a 7.1 x 7.6 x 6.4 cm lesion and is heterogeneous in signal intensity on pregadolinium T1 and T2- weighted images.  The periphery of the lesion is predominantly T1 hyperintense and T2 hypointense, while the central aspect of the lesion is heterogeneously T1 hypointense and generally T2 hyperintense.  Post gadolinium imaging is limited by considerable patient motion which limits accurate assessment of internal enhancement.  There does appear to be some heterogeneous internal enhancement which is predominately peripheral and nodular, with some progressive enhancement centrally.  Delayed post gadolinium images obtained at 20 minutes demonstrates accumulation of the contrast within the liver parenchyma around the lesion, but there is relative paucity of contrast within the lesion, indicative of a lesion of non hepatocyte origin.  Within the visualized portions of the abdomen, the appearance of the gallbladder, pancreas, spleen, bilateral adrenal  glands and bilateral kidneys is unremarkable.  IMPRESSION: 1.  7.1 x 7.6 x 6.4 cm lesion in segment 4B and 5 of the liver has unusual imaging characteristics, detailed above.  Given  the lack of accumulation of contrast agent on delayed phase imaging with Eovist, which indicates a lesion not of hepatocellular origin, differential considerations primarily include an atypical giant cavernous hemangioma, atypical adenoma with central necrosis, or less likely a large solitary metastasis with central necrosis. Lack of enhancement on the delayed images with Eovist indicates that this is not a large focal nodular hyperplasia (FNH).  At this time, a short term follow-up MRI with Multihance (not Eovist) in 2- 3 months is recommended to assess for size stability, and to better evaluate the dynamic post-contrast enhancement characteristics of this lesion.  Original Report Authenticated By: Trudie Reed, M.D.    Assessment/Plan Principal Problem:   Intractable vomiting Active Problems:   IDDM   Chronic respiratory failure   Abdominal  pain, other specified site   Lactic acidosis   Hypoglycemia   1. Intractable vomiting: Most likely due to the hepatic mass causing compression. But since she is diabetic gastroparesis can also occur and viral gastroenteritis can also occur. At present we will treat her with IV fluids and supportive management that aids in symptoms relived. Further work up of the hepatic mass may need inpatient as well.  2. IDDM: Continuing on sliding scale only with insulin.  3. Abdominal pain and lactic acidosis. Likely due to hepatic mass and hypotension and should get better with hydration. Dilaudid for paincontrol.   DVT Prophylaxis: Heparin Nutrition: npo  Code Status: full    Author: Lynden Oxford, MD [facility] Pager: [phone] 02/26/2013 7:17 AM    If 7PM-7AM, please contact night-coverage [email or web address] Password [email or web address]

## 2013-02-26 NOTE — Op Note (Signed)
[facility] [address] [place], [address]   ENDOSCOPY PROCEDURE REPORT  PATIENT: [name], [name]  MR#: [id number] BIRTHDATE: [birth year] , 42  yrs. old GENDER: Female ENDOSCOPIST: Roxy Cedar, MD REFERRED BY:  [facility] PROCEDURE DATE:  02/26/2013 PROCEDURE:  EGD, diagnostic ASA CLASS:     Class II INDICATIONS:  Vomiting.   Nausea.   abdominal pain. MEDICATIONS: Fentanyl 100 mcg IV and Versed 5 mg IV TOPICAL ANESTHETIC: Cetacaine Spray  DESCRIPTION OF PROCEDURE: After the risks benefits and alternatives of the procedure were thoroughly explained, informed consent was obtained.  The Pentax Gastroscope [id number] endoscope was introduced through the mouth and advanced to the second portion of the duodenum. Without limitations.  The instrument was slowly withdrawn as the mucosa was fully examined.      EXAM:The upper, middle and distal third of the esophagus were carefully inspected. There was exudative esophagitis involving the distal third of the esophagus..  The z-line was well seen at the GEJ.  The endoscope was pushed into the fundus which was normal including a retroflexed view.  The antrum, gastric body, first and second part of the duodenum were unremarkable.  Retroflexed views revealed no abnormalities.     The scope was then withdrawn from the patient and the procedure completed.  COMPLICATIONS: There were no complications. ENDOSCOPIC IMPRESSION: 1. Reflux esophagitis  RECOMMENDATIONS: 1.  PPI bid 2.  Gastric Emptying Scan . 3. HIDA scan REPEAT EXAM:  eSigned:  Roxy Cedar, MD 02/26/2013 3:41 PM   [id number] Lovell Sheehan, MD and The Patient

## 2013-02-26 NOTE — Care Management Note (Signed)
    Page 1 of 1   03/04/2013     11:33:56 AM   CARE MANAGEMENT NOTE 03/04/2013  Patient:  [facility]   Account Number:  [id number]  Date Initiated:  02/26/2013  Documentation initiated by:  Letha Cape  Subjective/Objective Assessment:   dx intractable vomiting  admit-lives alone.     Action/Plan:   Anticipated DC Date:  03/04/2013   Anticipated DC Plan:  HOME/SELF CARE      DC Planning Services  CM consult      Choice offered to / List presented to:             Status of service:  Completed, signed off Medicare Important Message given?   (If response is "NO", the following Medicare IM given date fields will be blank) Date Medicare IM given:   Date Additional Medicare IM given:    Discharge Disposition:  HOME/SELF CARE  Per UR Regulation:  Reviewed for med. necessity/level of care/duration of stay  If discussed at Long Length of Stay Meetings, dates discussed:    Comments:  03/04/13 11:32 Letha Cape RN, BSN [phone] faxed insulin script to [facility] and they are working on filling it now, it does not need to be pre [place]. Patient has transportation at dc.  Patient will have outpt aptt with either [facility] or [facility] per MD.  02/28/13 10:01 Letha Cape RN, BSN [phone] patient may have a Hemiangioma (bleeding), patient has a lupus MD at [facility] [place] will try to see if they will be willing to take on her care.  Patient is too large for hida scan.  Patient for possible transfer today.  02/26/13  10:03 Letha Cape RN, BSN [phone] patient lives alone, pta indep, NCM will continue to follow for dc needs.

## 2013-02-27 ENCOUNTER — Encounter (HOSPITAL_COMMUNITY): Payer: Self-pay | Admitting: Internal Medicine

## 2013-02-27 DIAGNOSIS — R109 Unspecified abdominal pain: Secondary | ICD-10-CM

## 2013-02-27 DIAGNOSIS — D490 Neoplasm of unspecified behavior of digestive system: Secondary | ICD-10-CM

## 2013-02-27 DIAGNOSIS — R112 Nausea with vomiting, unspecified: Secondary | ICD-10-CM

## 2013-02-27 DIAGNOSIS — R7402 Elevation of levels of lactic acid dehydrogenase (LDH): Secondary | ICD-10-CM

## 2013-02-27 DIAGNOSIS — R7401 Elevation of levels of liver transaminase levels: Secondary | ICD-10-CM

## 2013-02-27 LAB — CBC
MCH: 27 pg (ref 26.0–34.0)
MCHC: 32.2 g/dL (ref 30.0–36.0)
MCV: 83.8 fL (ref 78.0–100.0)
Platelets: 247 10*3/uL (ref 150–400)
RBC: 3.59 MIL/uL — ABNORMAL LOW (ref 3.87–5.11)
RDW: 15.6 % — ABNORMAL HIGH (ref 11.5–15.5)

## 2013-02-27 LAB — GLUCOSE, CAPILLARY
Glucose-Capillary: 139 mg/dL — ABNORMAL HIGH (ref 70–99)
Glucose-Capillary: 164 mg/dL — ABNORMAL HIGH (ref 70–99)

## 2013-02-27 LAB — CORTISOL: Cortisol, Plasma: 20.8 ug/dL

## 2013-02-27 LAB — HEPATIC FUNCTION PANEL
AST: 193 U/L — ABNORMAL HIGH (ref 0–37)
Albumin: 2.6 g/dL — ABNORMAL LOW (ref 3.5–5.2)
Alkaline Phosphatase: 104 U/L (ref 39–117)
Bilirubin, Direct: <0.1 mg/dL (ref 0.0–0.3)
Total Bilirubin: 0.4 mg/dL (ref 0.3–1.2)

## 2013-02-27 NOTE — Progress Notes (Signed)
Nuclear med called RN and said the patient is over the weight capacity for the table used to do the HIDA scan.

## 2013-02-27 NOTE — Consult Note (Signed)
[name] 1971-06-01  [id number].   Primary Care MD: Dr. Della Goo Requesting MD: Dr. Jeoffrey Massed Chief Complaint/Reason for Consult: abdominal pain, nausea and vomiting HPI: This is a 42 yo morbidly obese black female with multiple medical problems who began having abdominal pain in her epigastrium and RUQ about a month ago.  She states that this pain has become more constant in nature and is a dull ache.  It is not exacerbated by food or anything else.  Nothing makes it worse or better.  She admits to some chills, but no fevers.  She has since also developed nausea and vomiting.  She was admitted last week and found to have a large liver mass, most c/w hemangioma vs adenoma.  It was not felt to reflect HCC.  It was felt less likely to be a solitary met from a distance malignancy.  She had an ultrasound as well which was unrevealing.  GI saw the patient and did an endoscopy which just revealed gastritis, but no other findings.  She was dc home this weekend and returned only a day or 2 after discharge due to uncontrollable pain and emesis.  Her transaminases are on the rise, but her TB and alkph are still normal.  We have been asked to see her for further recommendations.  Review of Systems: Please see HPI, otherwise all other systems are negative, except for persistent SOB secondary to "my CHF."  She also c/o chest pain when vomiting.  Family History  Problem Relation Age of Onset  . Diabetes Mother   . Hypertension Other   . Stroke Other   . Arthritis Other     Past Medical History  Diagnosis Date  . IDDM 08/09/2010  . Bell's palsy 08/09/2010  . SLE 08/09/2010  . SJOGREN'S SYNDROME 08/09/2010  . Hypertension   . High cholesterol   . CHF (congestive heart failure)   . Anginal pain   . Shortness of breath     "all the time"  . Obstructive sleep apnea on CPAP 2011  . Migraines   . Stroke 2010    "left side is still weak from it; never fully regained full strength"  . Anxiety    . GERD (gastroesophageal reflux disease)     Past Surgical History  Procedure Laterality Date  . Ectopic pregnancy surgery  1999  . Cardiac catheterization  02/07/12  . Hernia repair  1980's    umbilical  . Epidermoid cyst excision  2007     left breast  . Esophagogastroduodenoscopy N/A 02/26/2013    Procedure: ESOPHAGOGASTRODUODENOSCOPY (EGD);  Surgeon: Hilarie Fredrickson, MD;  Location: [facility] ENDOSCOPY;  Service: Endoscopy;  Laterality: N/A;    Social History:  reports that she quit smoking about 22 years ago. Her smoking use included Cigarettes. She smoked 0.00 packs per day for 3 years. She has never used smokeless tobacco. She reports that she does not drink alcohol or use illicit drugs.  Allergies:  Allergies  Allergen Reactions  . Oxycodone-Acetaminophen Hives    Medications Prior to Admission  Medication Sig Dispense Refill  . acetaminophen (TYLENOL) 500 MG tablet Take 500-1,000 mg by mouth every 6 (six) hours as needed. For pain.      Marland Kitchen albuterol (PROAIR HFA) 108 (90 BASE) MCG/ACT inhaler Inhale 2 puffs into the lungs every 6 (six) hours as needed. For shortness of breath/wheeze      . alendronate (FOSAMAX) 70 MG tablet Take 70 mg by mouth every 7 (seven) days. thursdays. Take  with a full glass of water on an empty stomach.      . ALPRAZolam (XANAX) 0.5 MG tablet Take 0.5 mg by mouth at bedtime.      Marland Kitchen aspirin EC 81 MG tablet Take 81 mg by mouth daily.      Marland Kitchen azaTHIOprine (IMURAN) 50 MG tablet Take 50 mg by mouth 2 (two) times daily.       Marland Kitchen buPROPion (WELLBUTRIN SR) 150 MG 12 hr tablet Take 150 mg by mouth 2 (two) times daily.      . carvedilol (COREG) 3.125 MG tablet Take 3.125 mg by mouth 2 (two) times daily with a meal.      . cetirizine (ZYRTEC) 10 MG tablet Take 10 mg by mouth daily.      . citalopram (CELEXA) 20 MG tablet Take 20 mg by mouth every evening.      . clopidogrel (PLAVIX) 75 MG tablet Take 75 mg by mouth daily.      . cycloSPORINE (RESTASIS) 0.05 % ophthalmic  emulsion Place 1 drop into both eyes 3 (three) times daily.      . famotidine (PEPCID) 20 MG tablet Take 20 mg by mouth 2 (two) times daily.      Marland Kitchen gabapentin (NEURONTIN) 300 MG capsule Take 300 mg by mouth 2 (two) times daily.       . hydroxychloroquine (PLAQUENIL) 200 MG tablet Take 200 mg by mouth 2 (two) times daily.       . insulin regular human CONCENTRATED (HUMULIN R) 500 UNIT/ML SOLN injection Inject 150-175 Units into the skin 3 (three) times daily. 3x a day (just before each meal) 175units in the morning-150 units with lunch-125 units at dinner      . isosorbide-hydrALAZINE (BIDIL) 20-37.5 MG per tablet Take 1 tablet by mouth 3 (three) times daily.      Marland Kitchen losartan (COZAAR) 100 MG tablet Take 50 mg by mouth daily.       . potassium chloride SA (K-DUR,KLOR-CON) 20 MEQ tablet Take 20 mEq by mouth 3 (three) times daily.       . pravastatin (PRAVACHOL) 10 MG tablet Take 10 mg by mouth at bedtime.        . torsemide (DEMADEX) 10 MG tablet Take 10 mg by mouth 3 (three) times daily.       . traZODone (DESYREL) 100 MG tablet Take 100 mg by mouth at bedtime.        Blood pressure 126/58, pulse 92, temperature 100.1 F (37.8 C), temperature source Oral, resp. rate 20, height 5\' 8"  (1.727 m), weight 394 lb 6.5 oz (178.9 kg), last menstrual period 02/25/2013, SpO2 98.00%. Physical Exam: General: morbidly obese black female who is sitting up in bed in NAD HEENT: head is normocephalic, atraumatic.  Sclera are noninjected.  PERRL.  Ears and nose without any masses or lesions.  Mouth is pink.  [place] in place. Heart: regular, rate, and rhythm.  Normal s1,s2. No obvious murmurs, gallops, or rubs noted.  Palpable radial and pedal pulses bilaterally Lungs: CTAB, no wheezes, rhonchi, or rales noted.  Respiratory effort nonlabored Abd: soft, mildly tender in epigastrium and RUQ, morbidly obese, +BS, no masses, hernias, or organomegaly MS: all 4 extremities are symmetrical with no cyanosis, clubbing, or  edema. Skin: warm and dry with no masses, lesions, or rashes Psych: A&Ox3 with an depressed affect.    Results for orders placed during the hospital encounter of 02/25/13 (from the past 48 hour(s))  PRO B NATRIURETIC PEPTIDE  Status: Abnormal   Collection Time    02/25/13  7:18 PM      Result Value Range   Pro B Natriuretic peptide (BNP) 162.5 (*) 0 - 125 pg/mL  CBC     Status: Abnormal   Collection Time    02/25/13  7:18 PM      Result Value Range   WBC 12.4 (*) 4.0 - 10.5 K/uL   RBC 3.95  3.87 - 5.11 MIL/uL   Hemoglobin 11.0 (*) 12.0 - 15.0 g/dL   HCT 45.4 (*) 09.8 - 11.9 %   MCV 81.8  78.0 - 100.0 fL   MCH 27.8  26.0 - 34.0 pg   MCHC 34.1  30.0 - 36.0 g/dL   RDW 14.7  82.9 - 56.2 %   Platelets 299  150 - 400 K/uL  BASIC METABOLIC PANEL     Status: Abnormal   Collection Time    02/25/13  7:18 PM      Result Value Range   Sodium 139  135 - 145 mEq/L   Potassium 3.3 (*) 3.5 - 5.1 mEq/L   Chloride 103  96 - 112 mEq/L   CO2 26  19 - 32 mEq/L   Glucose, Bld 42 (*) 70 - 99 mg/dL   Comment: CRITICAL RESULT CALLED TO, READ BACK BY AND VERIFIED WITH:     Donn Pierini [id number] WILDERK   BUN 8  6 - 23 mg/dL   Creatinine, Ser 7.84  0.50 - 1.10 mg/dL   Calcium 9.4  8.4 - 69.6 mg/dL   GFR calc non Af Amer 72 (*) >90 mL/min   GFR calc Af Amer 83 (*) >90 mL/min   Comment:            The eGFR has been calculated     using the CKD EPI equation.     This calculation has not been     validated in all clinical     situations.     eGFR's persistently     <90 mL/min signify     possible Chronic Kidney Disease.  POCT I-STAT TROPONIN I     Status: None   Collection Time    02/25/13  7:36 PM      Result Value Range   Troponin i, poc 0.01  0.00 - 0.08 ng/mL   Comment 3            Comment: Due to the release kinetics of cTnI,     a negative result within the first hours     of the onset of symptoms does not rule out     myocardial infarction with certainty.     If myocardial  infarction is still suspected,     repeat the test at appropriate intervals.  GLUCOSE, CAPILLARY     Status: Abnormal   Collection Time    02/25/13  9:43 PM      Result Value Range   Glucose-Capillary 46 (*) 70 - 99 mg/dL  LIPASE, BLOOD     Status: None   Collection Time    02/25/13  9:44 PM      Result Value Range   Lipase 20  11 - 59 U/L  HEPATIC FUNCTION PANEL     Status: Abnormal   Collection Time    02/25/13  9:44 PM      Result Value Range   Total Protein 7.1  6.0 - 8.3 g/dL   Albumin 2.9 (*) 3.5 - 5.2 g/dL  AST 64 (*) 0 - 37 U/L   Comment: HEMOLYSIS AT THIS LEVEL MAY AFFECT RESULT   ALT 56 (*) 0 - 35 U/L   Comment: HEMOLYSIS AT THIS LEVEL MAY AFFECT RESULT   Alkaline Phosphatase 77  39 - 117 U/L   Total Bilirubin 0.2 (*) 0.3 - 1.2 mg/dL   Bilirubin, Direct <1.6  0.0 - 0.3 mg/dL   Indirect Bilirubin NOT CALCULATED  0.3 - 0.9 mg/dL  CG4 I-STAT (LACTIC ACID)     Status: Abnormal   Collection Time    02/25/13  9:59 PM      Result Value Range   Lactic Acid, Venous 2.52 (*) 0.5 - 2.2 mmol/L  GLUCOSE, CAPILLARY     Status: Abnormal   Collection Time    02/25/13 11:41 PM      Result Value Range   Glucose-Capillary 55 (*) 70 - 99 mg/dL  GLUCOSE, CAPILLARY     Status: None   Collection Time    02/26/13  1:09 AM      Result Value Range   Glucose-Capillary 78  70 - 99 mg/dL  COMPREHENSIVE METABOLIC PANEL     Status: Abnormal   Collection Time    02/26/13  4:30 AM      Result Value Range   Sodium 139  135 - 145 mEq/L   Potassium 3.8  3.5 - 5.1 mEq/L   Chloride 105  96 - 112 mEq/L   CO2 23  19 - 32 mEq/L   Glucose, Bld 102 (*) 70 - 99 mg/dL   BUN 7  6 - 23 mg/dL   Creatinine, Ser 1.09  0.50 - 1.10 mg/dL   Calcium 8.8  8.4 - 60.4 mg/dL   Total Protein 6.1  6.0 - 8.3 g/dL   Albumin 2.6 (*) 3.5 - 5.2 g/dL   AST 540 (*) 0 - 37 U/L   ALT 107 (*) 0 - 35 U/L   Alkaline Phosphatase 76  39 - 117 U/L   Total Bilirubin 0.3  0.3 - 1.2 mg/dL   GFR calc non Af Amer 65 (*) >90  mL/min   GFR calc Af Amer 76 (*) >90 mL/min   Comment:            The eGFR has been calculated     using the CKD EPI equation.     This calculation has not been     validated in all clinical     situations.     eGFR's persistently     <90 mL/min signify     possible Chronic Kidney Disease.  CBC     Status: Abnormal   Collection Time    02/26/13  4:30 AM      Result Value Range   WBC 9.2  4.0 - 10.5 K/uL   RBC 3.55 (*) 3.87 - 5.11 MIL/uL   Hemoglobin 9.5 (*) 12.0 - 15.0 g/dL   HCT 98.1 (*) 19.1 - 47.8 %   MCV 83.1  78.0 - 100.0 fL   MCH 26.8  26.0 - 34.0 pg   MCHC 32.2  30.0 - 36.0 g/dL   RDW 29.5 (*) 62.1 - 30.8 %   Platelets 257  150 - 400 K/uL  PROTIME-INR     Status: None   Collection Time    02/26/13  4:30 AM      Result Value Range   Prothrombin Time 13.5  11.6 - 15.2 seconds   INR 1.05  0.00 - 1.49  GLUCOSE, CAPILLARY  Status: Abnormal   Collection Time    02/26/13  4:48 AM      Result Value Range   Glucose-Capillary 108 (*) 70 - 99 mg/dL   Comment 1 Documented in Chart     Comment 2 Notify RN    GLUCOSE, CAPILLARY     Status: Abnormal   Collection Time    02/26/13  7:56 AM      Result Value Range   Glucose-Capillary 115 (*) 70 - 99 mg/dL   Comment 1 Documented in Chart     Comment 2 Notify RN    AFP TUMOR MARKER     Status: None   Collection Time    02/26/13  8:00 AM      Result Value Range   AFP-Tumor Marker 5.0  0.0 - 8.0 ng/mL   Comment: (NOTE)     The Advia Centaur AFP immunoassay method is used.  Results obtained     with different assay methods or kits cannot be used interchangeably.     AFP is a valuable aid in the management of nonseminomatous testicular     cancer patients when used in conjunction with information available     from the clinical evaluation and other diagnostic procedures.     Increased serum AFP concentrations have also been observed in ataxia     telangiectasia, hereditary tyrosinemia, primary hepatocellular     carcinoma,  teratocarcinoma, gastrointestinal tract cancers with and     without liver metastases, and in benign hepatic conditions such as     acute viral hepatitis, chronic active hepatitis, and cirrhosis.  This     result cannot be interpreted as absolute evidence of the presence or     absence of malignant disease.  This result is not interpretable in     pregnant females.  TROPONIN I     Status: None   Collection Time    02/26/13  8:43 AM      Result Value Range   Troponin I <0.30  <0.30 ng/mL   Comment:            Due to the release kinetics of cTnI,     a negative result within the first hours     of the onset of symptoms does not rule out     myocardial infarction with certainty.     If myocardial infarction is still suspected,     repeat the test at appropriate intervals.  LACTIC ACID, PLASMA     Status: None   Collection Time    02/26/13  9:11 AM      Result Value Range   Lactic Acid, Venous 1.3  0.5 - 2.2 mmol/L  GLUCOSE, CAPILLARY     Status: Abnormal   Collection Time    02/26/13 12:11 PM      Result Value Range   Glucose-Capillary 156 (*) 70 - 99 mg/dL   Comment 1 Documented in Chart     Comment 2 Notify RN    GLUCOSE, CAPILLARY     Status: Abnormal   Collection Time    02/26/13  5:38 PM      Result Value Range   Glucose-Capillary 175 (*) 70 - 99 mg/dL   Comment 1 Documented in Chart     Comment 2 Notify RN    GLUCOSE, CAPILLARY     Status: Abnormal   Collection Time    02/26/13  8:49 PM      Result Value Range   Glucose-Capillary 160 (*)  70 - 99 mg/dL   Comment 1 Notify RN     Comment 2 Documented in Chart    GLUCOSE, CAPILLARY     Status: Abnormal   Collection Time    02/26/13 11:56 PM      Result Value Range   Glucose-Capillary 179 (*) 70 - 99 mg/dL   Comment 1 Notify RN     Comment 2 Documented in Chart    GLUCOSE, CAPILLARY     Status: Abnormal   Collection Time    02/27/13  3:40 AM      Result Value Range   Glucose-Capillary 139 (*) 70 - 99 mg/dL    Comment 1 Notify RN     Comment 2 Documented in Chart    HEPATIC FUNCTION PANEL     Status: Abnormal   Collection Time    02/27/13  6:15 AM      Result Value Range   Total Protein 6.5  6.0 - 8.3 g/dL   Albumin 2.6 (*) 3.5 - 5.2 g/dL   AST 409 (*) 0 - 37 U/L   ALT 266 (*) 0 - 35 U/L   Alkaline Phosphatase 104  39 - 117 U/L   Total Bilirubin 0.4  0.3 - 1.2 mg/dL   Bilirubin, Direct <8.1  0.0 - 0.3 mg/dL   Indirect Bilirubin NOT CALCULATED  0.3 - 0.9 mg/dL  CBC     Status: Abnormal   Collection Time    02/27/13  6:15 AM      Result Value Range   WBC 11.8 (*) 4.0 - 10.5 K/uL   RBC 3.59 (*) 3.87 - 5.11 MIL/uL   Hemoglobin 9.7 (*) 12.0 - 15.0 g/dL   HCT 19.1 (*) 47.8 - 29.5 %   MCV 83.8  78.0 - 100.0 fL   MCH 27.0  26.0 - 34.0 pg   MCHC 32.2  30.0 - 36.0 g/dL   RDW 62.1 (*) 30.8 - 65.7 %   Platelets 247  150 - 400 K/uL  CORTISOL     Status: None   Collection Time    02/27/13  6:15 AM      Result Value Range   Cortisol, Plasma 20.8     Comment: (NOTE)     AM:  4.3 - 22.4 ug/dL     PM:  3.1 - 84.6 ug/dL  GLUCOSE, CAPILLARY     Status: Abnormal   Collection Time    02/27/13  7:41 AM      Result Value Range   Glucose-Capillary 123 (*) 70 - 99 mg/dL  GLUCOSE, CAPILLARY     Status: Abnormal   Collection Time    02/27/13 12:37 PM      Result Value Range   Glucose-Capillary 152 (*) 70 - 99 mg/dL   Dg Chest 2 View  9/62/9528   *RADIOLOGY REPORT*  Clinical Data: Shortness of breath  CHEST - 2 VIEW  Comparison: February 20, 2013  Findings: Lungs clear.  Heart is borderline enlarged with normal pulmonary vascularity.  No adenopathy.  No bone lesions.  IMPRESSION: No edema or consolidation.  Heart borderline enlarged.   Original Report Authenticated By: Bretta Bang, M.D.   Acute Abdominal Series  02/26/2013   *RADIOLOGY REPORT*  Clinical Data: Abdominal pain.  Nausea and vomiting.  ACUTE ABDOMEN SERIES (ABDOMEN 2 VIEW & CHEST 1 VIEW)  Comparison: Chest x-ray dated 02/25/2013 and  abdominal radiographs dated 10/11/2012 and CT scan dated 02/20/2013  Findings: There is no free air or free fluid  in the abdomen.  The bowel gas pattern is normal with only a small amount of air in the bowel.  There is hepatomegaly.  Chronic cardiomegaly.  Slight pulmonary vascular prominence.  IMPRESSION: No evidence of bowel obstruction or other acute abnormality in the abdomen.  Hepatomegaly.  Chronic cardiomegaly.  Slight pulmonary vascular congestion.   Original Report Authenticated By: Francene Boyers, M.D.       Assessment/Plan 1. Large liver mass, etiology unknown 2. Abdominal pain 3. Nausea/vomiting 4. transaminitis 5. Morbid obesity, BMI 60 6. CHF, on chronic O2 at home 7. Lupus 8. Sjgren syndrome 9. HTN 10. DM 11. H/o CVA  Plan: 1. I have d/w Dr. Janee Morn and Dr. Donell Beers.  I will d/w IR to see if they are willing to biopsy this mass.  It can be risky to do this if it is a hemangioma because it could bleed; however, it would allow [place] to figure out what this is to be determine a plan for patient care.  If she did require an operation, at some point in the future, she would be high risk.  I will discuss first with radiology and further recommendations will be made after we determine if she is a candidate for biopsy. 2. Her symptoms of pain, nausea, and vomiting are likely coming from this area and not secondary to her gallbladder or any other source.  We will follow.    Catherina Pates E 02/27/2013, 1:14 PM Pager: [phone]

## 2013-02-27 NOTE — Progress Notes (Signed)
Talked with patient about diabetes and outpatient regimen for diabetes control.  According to the patient she had an appointment with Dr. Everardo All on the morning of July 21. She reports that she was having frequent low blood sugars so Dr. Everardo All decreased her Humulin R U-500 supper dose.  See note from J. [place], [place] below for confirmed Humulin R U-500 dosages:   Lovell Sheehan, [facility] Pharmacist Signed  Progress Notes Service date: 02/21/2013 10:02 AM  Related encounter: Admission (Discharged) from 02/20/2013 in [facility]   PHARM[facility] - HUMULIN R U-500  Spoke with patient and clarified U-500 insulin dosing. Patient describes using a syringe that holds 100 units. She draws up 35 units for breakfast, 30 units with lunch and 25 units with dinner. However, she actually uses 175 units with breakfast, 150 units with lunch and 125 units with dinner since U-500 (500 units/ml) is a concentrated insulin.   Patient reports that she checked her blood sugars frequently and her blood sugars usually run in the 80-130's but she was having lows almost daily after supper and frequently in the mornings.  As noted above, she did see Dr. Everardo All on 7/21 and he decreased her supper dosage of Humulin R U-500 but she was admitted to the hospital the same day and has not had a chance to see how the lower dose will work for her.  Talked with her about trends of blood glucose as an inpatient and informed that she is not on any basal insulin currently and blood glucose is well controlled on Novolog resistant correction scale Q4H.  Discussed potential for discharging on Lantus  and Novolog versus Humulin R U-500 (as indicated per phone discussion with M. York, PA) due to concern for hypoglycemia on prior outpatient diabetes management regimen.  She reports that she was on Lantus in the past and she feels comfortable with being discharged on Lantus and Novolog if the physician decides to change insulins  at time of discharge and have her follow up with Dr. Everardo All.  Patient states that Dr. Everardo All is on vacation until Mar 13, 2013.  Patient verbalized understanding of information discussed and states that she has no other questions or concerns related to her diabetes at this time.    Thanks, Orlando Penner, RN, MSN, CCRN Diabetes Coordinator Inpatient Diabetes Program [phone]

## 2013-02-27 NOTE — Progress Notes (Signed)
[facility] Daily Rounding Note 02/27/2013, 11:41 AM  SUBJECTIVE:       Weighs too much for the HIDA scan apparatus.  Still with the pain in RUQ to epigastrium.  Controlled temporarily with Dilaudid.  Still with nausea, limited dry heaves.  BMs yesterday.   OBJECTIVE:         Vital signs in last 24 hours:    Temp:  [98.6 F (37 C)-100.1 F (37.8 C)] 100.1 F (37.8 C) (07/30 0528) Pulse Rate:  [78-105] 92 (07/30 1033) Resp:  [13-23] 20 (07/30 0528) BP: (119-194)/(47-171) 126/58 mmHg (07/30 1033) SpO2:  [91 %-100 %] 98 % (07/30 0528) Last BM Date: 02/25/13 General: obese, sleepy but arouses easily.  comfortable   Heart: RRR Chest: clear.  Breathing easily. Abdomen: obese, soft, slightly tender RUQ/epigastrium (just got Dialudid)  Extremities: no CCE Neuro/Psych:  Pleasant, cooperative.  Affect flat.   Scheduled Meds: . ALPRAZolam  0.5 mg Oral QHS  . aspirin EC  81 mg Oral Daily  . azaTHIOprine  50 mg Oral BID  . carvedilol  3.125 mg Oral BID WC  . citalopram  20 mg Oral QPM  . clopidogrel  75 mg Oral Q breakfast  . cycloSPORINE  1 drop Both Eyes TID  . gabapentin  300 mg Oral BID  . heparin  5,000 Units Subcutaneous Q8H  . hydroxychloroquine  200 mg Oral BID  . insulin aspart  0-20 Units Subcutaneous Q4H  . isosorbide-hydrALAZINE  1 tablet Oral TID  . losartan  50 mg Oral Daily  . pantoprazole (PROTONIX) IV  40 mg Intravenous BID  . predniSONE  5 mg Oral Q breakfast  . traZODone  100 mg Oral QHS   Continuous Infusions: . dextrose 5 % and 0.9 % NaCl with KCl 20 mEq/L 100 mL/hr at 02/27/13 1032   PRN Meds:.albuterol, dextrose, hydrALAZINE, HYDROmorphone (DILAUDID) injection, LORazepam, ondansetron (ZOFRAN) IV, ondansetron   Recent Labs  02/25/13 1918 02/26/13 0430 02/27/13 0615  WBC 12.4* 9.2 11.8*  HGB 11.0* 9.5* 9.7*  HCT 32.3* 29.5* 30.1*  PLT 299 257 247   BMET  Recent Labs  02/25/13 1918 02/26/13 0430  NA 139 139  K 3.3* 3.8  CL 103 105  CO2 26  23  GLUCOSE 42* 102*  BUN 8 7  CREATININE 0.96 1.04  CALCIUM 9.4 8.8   LFT  Recent Labs  02/25/13 2144 02/26/13 0430 02/27/13 0615  PROT 7.1 6.1 6.5  ALBUMIN 2.9* 2.6* 2.6*  AST 64* 134* 193*  ALT 56* 107* 266*  ALKPHOS 77 76 104  BILITOT 0.2* 0.3 0.4  BILIDIR <0.1  --  <0.1  IBILI NOT CALCULATED  --  NOT CALCULATED   PT/INR  Recent Labs  02/26/13 0430  LABPROT 13.5  INR 1.05    Studies/Results: Acute Abdominal Series 02/26/2013     IMPRESSION: No evidence of bowel obstruction or other acute abnormality in the abdomen.  Hepatomegaly.  Chronic cardiomegaly.  Slight pulmonary vascular congestion.   Original Report Authenticated By: Francene Boyers, M.D.    ASSESMENT: * Several months of n/v. More recent epigastric pain. EGD 7/29 with reflux esophagitis, not an explanation for the pain. With steadily rising LFTs concern is for biliary disease despite lack of GB pathology on the imaging studies.  Too large for HIDA scan.  Possible gastroparesis, although sugars quite well controlled.  Gastric emptying study will not be possible, for same reasons as HIDA non feasibility.   * Hepatic lesion. Rule out  atypical hemangioma, rule out atypical adenoma. ? Is this source of the GI sxs? * Hepatomegaly.  * IDDM. Well controlled. A1c 7/21 was 5.6.  * Normocytic anemia. Chronic, baseline Hgb is 10.5 to 11.0  * Morbid obesity  * OSA. On CPAP at night.  * Hx CVA. On plavix, 81 mg ASA  * Hx CHF, "chronic". LVEF 09/2012 on echo was 55 to 60% "normal systolic function".  * Sjogren's syndrome, lupus. On Imuran, Plaquenil, at home.     PLAN: *  Surgical evaluation in progress, no note yet.    LOS: 2 days   Jennye Moccasin  02/27/2013, 11:41 AM Pager: [phone]  GI ATTENDING  Patient seen and examined. Agree with interval history, exam, and review of laboratory data. She continues with nausea, vomiting, and upper abdominal pain. Upper endoscopy revealing reflux. On high-dose PPI. With  ongoing symptoms and liver test abnormalities, possible acalculous cholecystitis considered. Unfortunately, her tremendous obesity inhibits workup. Surgical input value to appreciated. As well, there are addressing large hepatic lesion. From GI standpoint, continued high-dose PPI and symptomatic therapies.  Wilhemina Bonito. Eda Keys., M.D. [facility] Division of Gastroenterology

## 2013-02-27 NOTE — Progress Notes (Signed)
PATIENT DETAILS Name: [name] Age: 42 y.o. Sex: female Date of Birth: 1970/08/16 Admit Date: 02/25/2013 Admitting Physician Lynden Oxford, MD ZOX:[id number],[id number] C, MD  Subjective: She states her pain is well controlled with IV pain meds this AM.  She had an episode of vomiting last night but has not had another episode since then.  Assessment/Plan: Abdominal pain and vomiting  - Etiology not very certain-EGD basically negative. Not able to do a HIDA scan-patient's weight beyond the capability for HIDA scan. - Suspect right upper quadrant pain, and worsening LFTs from mass effect-from the liver mass. - We will consult surgery to see if any surgical options at this point - Continue with supportive care.  Transaminitis - Worsening - Not sure this is from liver mass - Avoid hepatotoxic medications - Await surgery and GI input today  Hepatic Mass  7.1 x 7.6 x 6.4 cm as noted on MRI Liver 7/27. Felt to be benign by radiologist.  Recommended follow up in 2 - 3 months with repeat MRI  Vaginal estrogen ring removed.  [facility] consulted for any further recommendations regarding work up.   IDDM  Hypoglycemic on admission.  Recently on U500.  Last admission CBGs did not indicate she needed U500.  Currently on q 4 hour SSI-Resistant while NPO and vomiting.  Will make recommendations at discharge & discuss with Dr. Everardo All   Lactic Acidosis  Secondary to vomiting  Resolved with IVF   Hypertension  Hydralazine IV PRN while vomiting.  Continue Bidil, Cozaar, Coreg when able.   SLE / Sjogren's  Continue Imuran, Plaquenil, Prednisone when able  Follows [facility] Lanell [facility]ar at [facility]   H/O Stroke  Continue Plavix   Anxiety  Xanax QHS  Celexa, Wellbutrin, Trazodone  Disposition: Remain inpatient  DVT Prophylaxis: Prophylactic Heparin  Code Status: Full code  Family Communication None at bedside  Procedures:  None  CONSULTS:   Surgery   MEDICATIONS: Scheduled Meds: . ALPRAZolam  0.5 mg Oral QHS  . aspirin EC  81 mg Oral Daily  . azaTHIOprine  50 mg Oral BID  . carvedilol  3.125 mg Oral BID WC  . citalopram  20 mg Oral QPM  . clopidogrel  75 mg Oral Q breakfast  . cycloSPORINE  1 drop Both Eyes TID  . gabapentin  300 mg Oral BID  . heparin  5,000 Units Subcutaneous Q8H  . hydroxychloroquine  200 mg Oral BID  . insulin aspart  0-20 Units Subcutaneous Q4H  . isosorbide-hydrALAZINE  1 tablet Oral TID  . losartan  50 mg Oral Daily  . pantoprazole (PROTONIX) IV  40 mg Intravenous BID  . predniSONE  5 mg Oral Q breakfast  . traZODone  100 mg Oral QHS   Continuous Infusions: . dextrose 5 % and 0.9 % NaCl with KCl 20 mEq/L 100 mL/hr at 02/26/13 2127   PRN Meds:.albuterol, dextrose, hydrALAZINE, HYDROmorphone (DILAUDID) injection, LORazepam, ondansetron (ZOFRAN) IV, ondansetron  Antibiotics: Anti-infectives   Start     Dose/Rate Route Frequency Ordered Stop   02/26/13 0130  hydroxychloroquine (PLAQUENIL) tablet 200 mg     200 mg Oral 2 times daily 02/26/13 0041         PHYSICAL EXAM: Vital signs in last 24 hours: Filed Vitals:   02/26/13 1610 02/26/13 1700 02/26/13 2052 02/27/13 0528  BP: 183/93 168/84 143/73 146/72  Pulse:  82 78 98  Temp:  98.6 F (37 C) 98.9 F (37.2 C) 100.1 F (37.8 C)  TempSrc:  Oral Oral Oral  Resp:  20 18 20   Height:      Weight:      SpO2:  99% 100% 98%    Weight change:  Filed Weights   02/25/13 1915 02/26/13 0107  Weight: 177.356 kg (391 lb) 178.9 kg (394 lb 6.5 oz)   Body mass index is 59.98 kg/(m^2).   Gen Exam: Morbidly obese female.  Awake and alert with clear speech.  NAD. Neck: Supple, No JVD. Chest: B/L Clear. CVS: S1 S2 Regular, no murmurs. Abdomen: soft, BS +, tenderness to epigastric region, RUQ and LUQ, non distended Extremities: no edema, lower extremities warm to touch. Neurologic: Non Focal. Skin: No Rash.   Intake/Output from previous  day:  Intake/Output Summary (Last 24 hours) at 02/27/13 1004 Last data filed at 02/26/13 1300  Gross per 24 hour  Intake      0 ml  Output      0 ml  Net      0 ml     LAB RESULTS: CBC  Recent Labs Lab 02/21/13 0550 02/22/13 0638 02/25/13 1918 02/26/13 0430 02/27/13 0615  WBC 9.2 9.2 12.4* 9.2 11.8*  HGB 10.0* 10.4* 11.0* 9.5* 9.7*  HCT 31.2* 31.3* 32.3* 29.5* 30.1*  PLT 312 291 299 257 247  MCV 84.1 82.8 81.8 83.1 83.8  MCH 27.0 27.5 27.8 26.8 27.0  MCHC 32.1 33.2 34.1 32.2 32.2  RDW 15.6* 15.6* 15.0 15.6* 15.6*    Chemistries   Recent Labs Lab 02/21/13 0550 02/22/13 0638 02/23/13 0519 02/25/13 1918 02/26/13 0430  NA 143 139 140 139 139  K 3.7 3.4* 3.7 3.3* 3.8  CL 107 103 103 103 105  CO2 27 23 29 26 23   GLUCOSE 134* 136* 138* 42* 102*  BUN 8 7 6 8 7   CREATININE 1.30* 1.15* 1.20* 0.96 1.04  CALCIUM 8.8 8.7 9.0 9.4 8.8    CBG:  Recent Labs Lab 02/26/13 1738 02/26/13 2049 02/26/13 2356 02/27/13 0340 02/27/13 0741  GLUCAP 175* 160* 179* 139* 123*    GFR Estimated Creatinine Clearance: 122.3 ml/min (by C-G formula based on Cr of 1.04).  Coagulation profile  Recent Labs Lab 02/20/13 1749 02/26/13 0430  INR 1.06 1.05    Cardiac Enzymes  Recent Labs Lab 02/26/13 0843  TROPONINI <0.30    Recent Labs  02/25/13 2144  LIPASE 20    Urine Studies No results found for this basename: UACOL, UAPR, USPG, UPH, UTP, UGL, UKET, UBIL, UHGB, UNIT, UROB, ULEU, UEPI, UWBC, URBC, UBAC, CAST, CRYS, UCOM, BILUA,  in the last 72 hours  MICROBIOLOGY: Recent Results (from the past 240 hour(s))  URINE CULTURE     Status: None   Collection Time    02/18/13  7:27 PM      Result Value Range Status   Specimen Description URINE, CLEAN CATCH   Final   Special Requests NONE   Final   Culture  Setup Time 02/18/2013 21:03   Final   Colony Count NO GROWTH   Final   Culture NO GROWTH   Final   Report Status 02/20/2013 FINAL   Final  URINE CULTURE      Status: None   Collection Time    02/20/13  8:01 PM      Result Value Range Status   Specimen Description URINE, RANDOM   Final   Special Requests NONE   Final   Culture  Setup Time 02/21/2013 02:32   Final   Colony Count NO GROWTH  Final   Culture NO GROWTH   Final   Report Status 02/21/2013 FINAL   Final    RADIOLOGY STUDIES/RESULTS: Ct Abdomen Pelvis W Wo Contrast  02/20/2013   *RADIOLOGY REPORT*  Clinical Data: Chest pain and upper abdominal pain with nausea and vomiting.  Shortness of breath.  CT ABDOMEN AND PELVIS WITHOUT AND WITH CONTRAST  Technique:  Multidetector CT imaging of the abdomen and pelvis was performed without contrast material in one or both body regions, followed by contrast material(s) and further sections in one or both body regions.  Contrast: OMNIPAQUE IOHEXOL 300 MG/ML  SOLN  Comparison: Ultrasound abdomen 02/20/2013.  Findings: Lung bases show scattered subsegmental atelectasis. Heart is enlarged.  No pericardial or pleural effusion.  Although the exam was intended to have arterial and portal venous phase imaging, an IV tubing malfunction occurred during contrast injection and the postcontrast images are both in the delayed nephrographic phase.  There is a heterogeneous mass in the right hepatic lobe, measuring approximately 5.9 x 7.7 cm in greatest transaxial dimension.  It bulges the liver contour slightly.  Liver measures approximately 22.6 cm.  Gallbladder, adrenal glands, kidneys, spleen, pancreas, stomach and bowel are unremarkable.  Uterus and ovaries are visualized.  No pathologically enlarged lymph nodes.  No worrisome lytic or sclerotic lesions.  IMPRESSION:  1.  Right hepatic lobe mass is poorly characterized on today's exam, for reasons given above.  Differential diagnosis is broad and includes abscess, hemangioma and neoplasm.  MR abdomen without and with contrast (preferably with Eovist) is recommended. 2.  Hepatomegaly.   Original Report Authenticated  By: Leanna Battles, M.D.   Dg Chest 2 View  02/25/2013   *RADIOLOGY REPORT*  Clinical Data: Shortness of breath  CHEST - 2 VIEW  Comparison: February 20, 2013  Findings: Lungs clear.  Heart is borderline enlarged with normal pulmonary vascularity.  No adenopathy.  No bone lesions.  IMPRESSION: No edema or consolidation.  Heart borderline enlarged.   Original Report Authenticated By: Bretta Bang, M.D.   Dg Chest 2 View  02/18/2013   *RADIOLOGY REPORT*  Clinical Data: Shortness of breath  CHEST - 2 VIEW  Comparison:  October 09, 2012  Findings:  Lungs are clear.  Heart is mildly enlarged with normal pulmonary vascularity.  No adenopathy.  No bone lesions.  IMPRESSION: No edema or consolidation.  Stable mild cardiac enlargement.   Original Report Authenticated By: Bretta Bang, M.D.   Mr Abdomen W Wo Contrast  02/25/2013   **ADDENDUM** CREATED: 02/25/2013 09:05:20  These findings and recommendations were called by telephone on 02/25/2013 at 09:00 AM to [facility], who verbally acknowledged these results.  **END ADDENDUM** SIGNED BY: Florencia Reasons, M.D.  02/25/2013   *RADIOLOGY REPORT*  Clinical Data: Follow-up evaluation of liver lesions seen on CT examination.  MRI ABDOMEN WITH AND WITHOUT CONTRAST  Technique:  Multiplanar multisequence MR imaging of the abdomen was performed both before and after administration of intravenous contrast.  Contrast:  10 ml Eovist  Comparison: CT of the abdomen and pelvis 02/20/2013.  Comment:  Study is limited by considerable patient motion, particularly on the postcontrast images, which affects both have their quality, as well as the fidelity of the subtraction images.  Findings: Within the central aspect of the liver, predominantly involving segments 4B and 5, there is a 7.1 x 7.6 x 6.4 cm lesion and is heterogeneous in signal intensity on pregadolinium T1 and T2- weighted images.  The periphery of the lesion is  predominantly T1 hyperintense and T2 hypointense,  while the central aspect of the lesion is heterogeneously T1 hypointense and generally T2 hyperintense.  Post gadolinium imaging is limited by considerable patient motion which limits accurate assessment of internal enhancement.  There does appear to be some heterogeneous internal enhancement which is predominately peripheral and nodular, with some progressive enhancement centrally.  Delayed post gadolinium images obtained at 20 minutes demonstrates accumulation of the contrast within the liver parenchyma around the lesion, but there is relative paucity of contrast within the lesion, indicative of a lesion of non hepatocyte origin.  Within the visualized portions of the abdomen, the appearance of the gallbladder, pancreas, spleen, bilateral adrenal glands and bilateral kidneys is unremarkable.  IMPRESSION: 1.  7.1 x 7.6 x 6.4 cm lesion in segment 4B and 5 of the liver has unusual imaging characteristics, detailed above.  Given the lack of accumulation of contrast agent on delayed phase imaging with Eovist, which indicates a lesion not of hepatocellular origin, differential considerations primarily include an atypical giant cavernous hemangioma, atypical adenoma with central necrosis, or less likely a large solitary metastasis with central necrosis. Lack of enhancement on the delayed images with Eovist indicates that this is not a large focal nodular hyperplasia (FNH).  At this time, a short term follow-up MRI with Multihance (not Eovist) in 2- 3 months is recommended to assess for size stability, and to better evaluate the dynamic post-contrast enhancement characteristics of this lesion.  Original Report Authenticated By: Trudie Reed, M.D.   US Abdomen Complete  02/20/2013   *RADIOLOGY REPORT*  Abdominal ultrasound  History:  Abdominal pain  Findings:  Gallbladder is visualized in multiple projections. There are no gallstones, gallbladder wall thickening, or pericholecystic fluid collection.  There is no  intrahepatic, common hepatic, or common bile duct dilatation.  Pancreas appears normal.  In the left lobe of the liver, there is a mass-like area which is slightly hyperechoic compared the remainder of the liver measuring 7.6 x 5.2 x 7.8 cm in size.  No other focal liver lesions are identified.  Spleen is normal in size and homogeneous in echotexture.  Kidneys bilaterally appear within normal limits.  There is no ascites. Aorta is nonaneurysmal.  Inferior vena cava appears patent.  Conclusion:  Mass-like area in the left lobe of the liver.  This finding warrants further evaluation.  MRI with and without intravenous contrast would be the imaging study of choice to further assess.  Study otherwise unremarkable.   Original Report Authenticated By: Bretta Bang, M.D.   Dg Chest Portable 1 View  02/20/2013   *RADIOLOGY REPORT*  Clinical Data: Chest pain, shortness of breath  PORTABLE CHEST - 1 VIEW  Comparison: Prior radiograph from 02/18/2013  Findings: Cardiomegaly is likely unchanged allowing for differences in technique.  Mediastinal silhouette is stable.  Current examination has been performed with a similar degree of lung inflation. No frank pulmonary edema is identified.  No pleural effusion or airspace consolidation.  No pneumothorax.  Osseous structures are unchanged.  IMPRESSION: Stable cardiomegaly without frank pulmonary edema or airspace consolidation.   Original Report Authenticated By: Rise Mu, M.D.   Acute Abdominal Series  02/26/2013   *RADIOLOGY REPORT*  Clinical Data: Abdominal pain.  Nausea and vomiting.  ACUTE ABDOMEN SERIES (ABDOMEN 2 VIEW & CHEST 1 VIEW)  Comparison: Chest x-ray dated 02/25/2013 and abdominal radiographs dated 10/11/2012 and CT scan dated 02/20/2013  Findings: There is no free air or free fluid in the abdomen.  The bowel gas  pattern is normal with only a small amount of air in the bowel.  There is hepatomegaly.  Chronic cardiomegaly.  Slight pulmonary vascular  prominence.  IMPRESSION: No evidence of bowel obstruction or other acute abnormality in the abdomen.  Hepatomegaly.  Chronic cardiomegaly.  Slight pulmonary vascular congestion.   Original Report Authenticated By: Francene Boyers, M.D.    Rema Fendt, PA-S [facility]   If 7PM-7AM, please contact night-coverage [email or web address] Password [email or web address] 02/27/2013, 10:04 AM   LOS: 2 days   Attending - Patient seen and examined, the with the above assessment and plan. Documentation revealed, necessary changes made. Continues to have mostly upper abdominal pain with nausea and vomiting, continue supportive care. We'll get surgery to see if any surgical option in this patient. Rest as above  S Yazen Rosko

## 2013-02-27 NOTE — Consult Note (Signed)
Will plan for IR biopsy of liver mass.  I discussed this with the patient and Dr. Donell Beers, surgical oncologist  from our practice has also reviewed her case.  Will follow. Patient examined and I agree with the assessment and plan  [name] Gelinas, MD, MPH, FACS Pager: [phone]  02/27/2013 4:00 PM

## 2013-02-27 NOTE — ED Provider Notes (Addendum)
I saw and evaluated the patient, reviewed the resident's note and I agree with the findings and plan.  Patient seen for vomiting. She is dehydrated and has a lactic acidosis, but unclear reason for abdominal pain and hypoglycemia. Admitted for management.  Angiocath insertion - Performed by Dr. Gary Fleet, supervised by me Performed by: Audelia Hives  Consent: Verbal consent obtained. Risks and benefits: risks, benefits and alternatives were discussed Time out: Immediately prior to procedure a "time out" was called to verify the correct patient, procedure, equipment, support staff and site/side marked as required.  Preparation: Patient was prepped and draped in the usual sterile fashion.  Vein Location: right AC  Ultrasound Guided  Gauge: 20     Gilda Crease, MD 02/27/13 1631  Gilda Crease, MD 03/05/13 1610  Gilda Crease, MD 03/05/13 0730

## 2013-02-28 DIAGNOSIS — R160 Hepatomegaly, not elsewhere classified: Secondary | ICD-10-CM | POA: Diagnosis present

## 2013-02-28 LAB — CBC
HCT: 26.3 % — ABNORMAL LOW (ref 36.0–46.0)
MCV: 84 fL (ref 78.0–100.0)
Platelets: 249 10*3/uL (ref 150–400)
RBC: 3.13 MIL/uL — ABNORMAL LOW (ref 3.87–5.11)
RDW: 15.5 % (ref 11.5–15.5)
WBC: 12.1 10*3/uL — ABNORMAL HIGH (ref 4.0–10.5)

## 2013-02-28 LAB — GLUCOSE, CAPILLARY
Glucose-Capillary: 106 mg/dL — ABNORMAL HIGH (ref 70–99)
Glucose-Capillary: 110 mg/dL — ABNORMAL HIGH (ref 70–99)
Glucose-Capillary: 138 mg/dL — ABNORMAL HIGH (ref 70–99)
Glucose-Capillary: 140 mg/dL — ABNORMAL HIGH (ref 70–99)
Glucose-Capillary: 148 mg/dL — ABNORMAL HIGH (ref 70–99)
Glucose-Capillary: 156 mg/dL — ABNORMAL HIGH (ref 70–99)

## 2013-02-28 LAB — BASIC METABOLIC PANEL
CO2: 26 mEq/L (ref 19–32)
Calcium: 8.6 mg/dL (ref 8.4–10.5)
Chloride: 107 mEq/L (ref 96–112)
GFR calc non Af Amer: 69 mL/min — ABNORMAL LOW (ref >90)
Glucose, Bld: 102 mg/dL — ABNORMAL HIGH (ref 70–99)
Potassium: 3.9 mEq/L (ref 3.5–5.1)

## 2013-02-28 LAB — HEPATIC FUNCTION PANEL
Albumin: 2.3 g/dL — ABNORMAL LOW (ref 3.5–5.2)
Bilirubin, Direct: <0.1 mg/dL (ref 0.0–0.3)
Total Bilirubin: 0.5 mg/dL (ref 0.3–1.2)

## 2013-02-28 LAB — PROTIME-INR
INR: 1.1 (ref 0.00–1.49)
Prothrombin Time: 14 seconds (ref 11.6–15.2)

## 2013-02-28 MED ORDER — SENNA 8.6 MG PO TABS
2.0000 | ORAL_TABLET | Freq: Two times a day (BID) | ORAL | Status: DC
  Administered 2013-02-28 – 2013-03-03 (×5): 17.2 mg via ORAL
  Filled 2013-02-28 (×11): qty 2

## 2013-02-28 MED ORDER — PREDNISONE 5 MG PO TABS: 5.0000 mg | ORAL_TABLET | Freq: Every day | ORAL | Status: DC

## 2013-02-28 MED ORDER — ONDANSETRON HCL 4 MG PO TABS: 4.0000 mg | ORAL_TABLET | Freq: Four times a day (QID) | ORAL | Status: DC | PRN

## 2013-02-28 MED ORDER — HYDROMORPHONE HCL PF 1 MG/ML IJ SOLN: 0.5000 mg | INTRAMUSCULAR | Status: DC | PRN

## 2013-02-28 MED ORDER — OXYCODONE HCL 5 MG PO TABS: 10.0000 mg | ORAL_TABLET | ORAL | Status: DC | PRN

## 2013-02-28 MED ORDER — POLYETHYLENE GLYCOL 3350 17 G PO PACK
17.0000 g | PACK | Freq: Two times a day (BID) | ORAL | Status: DC
  Administered 2013-02-28 – 2013-03-04 (×7): 17 g via ORAL
  Filled 2013-02-28 (×9): qty 1

## 2013-02-28 MED ORDER — PANTOPRAZOLE SODIUM 40 MG IV SOLR: 40.0000 mg | Freq: Two times a day (BID) | INTRAVENOUS | Status: DC

## 2013-02-28 MED ORDER — SODIUM CHLORIDE 0.9 % IV SOLN
INTRAVENOUS | Status: DC
  Administered 2013-02-28: 1000 mL via INTRAVENOUS
  Administered 2013-03-01: 03:00:00 via INTRAVENOUS

## 2013-02-28 MED ORDER — INSULIN ASPART 100 UNIT/ML ~~LOC~~ SOLN
0.0000 [IU] | SUBCUTANEOUS | Status: DC
  Administered 2013-02-28: 3 [IU] via SUBCUTANEOUS
  Administered 2013-02-28 – 2013-03-01 (×2): 2 [IU] via SUBCUTANEOUS

## 2013-02-28 MED ORDER — LORAZEPAM 2 MG/ML IJ SOLN: 0.5000 mg | Freq: Two times a day (BID) | INTRAMUSCULAR | Status: DC | PRN

## 2013-02-28 MED ORDER — HYDROCODONE-ACETAMINOPHEN 10-325 MG PO TABS: 1.0000 | ORAL_TABLET | Freq: Four times a day (QID) | ORAL | Status: DC | PRN

## 2013-02-28 NOTE — Progress Notes (Signed)
[facility] Daily Rounding Note 02/28/2013, 9:49 AM  SUBJECTIVE:       Still with pain in upper abdomen and nausea.  The nausea is better and she feels hungry.   Used 4 mg of Dilaudid, 100 mg of Fentanyl yesterday.   OBJECTIVE:         Vital signs in last 24 hours:    Temp:  [98.7 F (37.1 C)-99.5 F (37.5 C)] 99 F (37.2 C) (07/31 0525) Pulse Rate:  [78-92] 83 (07/31 0525) Resp:  [2-20] 18 (07/31 0525) BP: (121-151)/(58-74) 151/71 mmHg (07/31 0525) SpO2:  [95 %-100 %] 99 % (07/31 0525) Last BM Date: 02/25/13 General: obese, comfortable, not ill looking   Heart: RRR Chest: clear bil.  Breathing unlabored Abdomen: soft, obese, tender in upper abdomen, no guard or rebound.  BS hypoactive.   Extremities: no CCE Neuro/Psych:  Pleasant, seems depressed, not somnolent.   Intake/Output from previous day: 07/30 0701 - 07/31 0700 In: 5193.3 [P.O.:480; I.V.:4713.3] Out: -   Intake/Output this shift:   Scheduled Meds: . ALPRAZolam  0.5 mg Oral QHS  . aspirin EC  81 mg Oral Daily  . azaTHIOprine  50 mg Oral BID  . carvedilol  3.125 mg Oral BID WC  . citalopram  20 mg Oral QPM  . clopidogrel  75 mg Oral Q breakfast  . cycloSPORINE  1 drop Both Eyes TID  . gabapentin  300 mg Oral BID  . heparin  5,000 Units Subcutaneous Q8H  . hydroxychloroquine  200 mg Oral BID  . insulin aspart  0-20 Units Subcutaneous Q4H  . isosorbide-hydrALAZINE  1 tablet Oral TID  . losartan  50 mg Oral Daily  . pantoprazole (PROTONIX) IV  40 mg Intravenous BID  . predniSONE  5 mg Oral Q breakfast  . traZODone  100 mg Oral QHS   Continuous Infusions: . dextrose 5 % and 0.9 % NaCl with KCl 20 mEq/L 100 mL/hr at 02/28/13 0516   PRN Meds:.albuterol, dextrose, hydrALAZINE, HYDROmorphone (DILAUDID) injection, LORazepam, ondansetron (ZOFRAN) IV, ondansetron   Lab Results:  Recent Labs  02/26/13 0430 02/27/13 0615 02/28/13 0515  WBC 9.2 11.8* 12.1*  HGB 9.5* 9.7* 8.5*  HCT 29.5* 30.1* 26.3*  PLT  257 247 249  MCV    84  BMET  Recent Labs  02/25/13 1918 02/26/13 0430 02/28/13 0515  NA 139 139 139  K 3.3* 3.8 3.9  CL 103 105 107  CO2 26 23 26   GLUCOSE 42* 102* 102*  BUN 8 7 6   CREATININE 0.96 1.04 0.99  CALCIUM 9.4 8.8 8.6   LFT  Recent Labs  02/25/13 2144 02/26/13 0430 02/27/13 0615 02/28/13 0515  PROT 7.1 6.1 6.5 5.7*  ALBUMIN 2.9* 2.6* 2.6* 2.3*  AST 64* 134* 193* 62*  ALT 56* 107* 266* 166*  ALKPHOS 77 76 104 96  BILITOT 0.2* 0.3 0.4 0.5  BILIDIR <0.1  --  <0.1 <0.1  IBILI NOT CALCULATED  --  NOT CALCULATED NOT CALCULATED   PT/INR  Recent Labs  02/26/13 0430 02/28/13 0515  LABPROT 13.5 14.0  INR 1.05 1.10   ASSESMENT: *  Several months of n/v. More recent epigastric pain.  EGD 7/29 with reflux esophagitis, not an explanation for the pain. On Protonix 40 mg po BID.  * Hepatic lesion. Rule out atypical hemangioma, rule out atypical adenoma.  Surgery surmising the abdominal pain is from capsular stretch.  They are not pursuing workup, including IR attempt at biopsy since  pt is not an operative or IR ablation, embolization candidate whatever the biopsy pathology would prove.  LFTs improved.  * Hepatomegaly.  *  Normocytic anemia.  Hgb down 1.2 grams in last 24 hours. Baseline Hgb is ~ 11.5.  * IDDM. Well controlled. A1c 7/21 was 5.6.  * Normocytic anemia. Chronic, baseline Hgb is 10.5 to 11.0  * Morbid obesity.  Says she has lost down from 240 or so # to current 390s using diet and excercis.  * OSA. On CPAP at night.  * Hx CVA. On plavix, 81 mg ASA.   * Hx CHF, "chronic". LVEF 09/2012 on echo was 55 to 60% "normal systolic function".  * Sjogren's syndrome, lupus. On Imuran, Plaquenil, at home.    PLAN: *  Since there is concern that the liver mass may be oozing (see surgical note)  Should the Plavix be held?  *  I encouraged her to continue weight loss efforts.  Wonder if a different anti-depressant regimen might be more conducive to weight loss?   Currently on Trazodone and Wellbutrin.  This ought to be adjusted by her PMD who oversees these meds.    LOS: 3 days   [name]  02/28/2013, 9:49 AM Pager: [phone]  GI ATTENDING  Patient seen and examined. Interval of NSAIDs and data reviewed. Agree with assessment and plan as outlined above. From a GI standpoint, recommend ongoing PPI for GERD. As well, the importance of weight loss cannot be stressed enough. Appreciate surgical input regarding liver lesion. Return to the care of her PCP and primary team. Will sign off  John N. Eda Keys., M.D. [facility] Division of Gastroenterology

## 2013-02-28 NOTE — Progress Notes (Signed)
PATIENT DETAILS Name: [name] Age: 42 y.o. Sex: female Date of Birth: 05/04/1971 Admit Date: 02/25/2013 Admitting Physician Lynden Oxford, MD [id number] C, MD  Subjective: She continues to have RUQ pain and nausea  Assessment/Plan: Abdominal pain and vomiting  - Etiology not very certain-EGD basically negative. Not able to do a HIDA scan-patient's weight beyond the capability for HIDA scan. - Suspect right upper quadrant pain, and worsening LFTs from mass effect-from the liver mass. - Appreciate surgery consutl-will see if we can speak with hepato-biliary surgeon at [facility] to see if any surgical options at this point-they will call me back  - Continue with supportive care.  Transaminitis - Suspect this is from liver mass-and mass effect - Avoid hepatotoxic medications  Hepatic Mass  7.1 x 7.6 x 6.4 cm as noted on MRI Liver 7/27. Felt to be benign by radiologist.  Recommended follow up in 2 - 3 months with repeat MRI  Current suspicion for a atypical hemangioma-?expanding and causing mass effect-await call back from [facility]  IDDM  Hypoglycemic on admission.  Recently on U500.  Last admission CBGs did not indicate she needed U500.  Currently on q 4 hour SSI-Resistant while NPO and vomiting.  Will make recommendations at discharge & discuss with Dr. Everardo All   Lactic Acidosis  Secondary to vomiting  Resolved with IVF   Hypertension  Hydralazine IV PRN while vomiting.  Continue Bidil, Cozaar, Coreg when able.   SLE / Sjogren's  Continue Imuran, Plaquenil, Prednisone when able  Follows with Dr. Lanell Matar at [facility]   H/O Stroke  Continue Plavix   Anxiety  Xanax QHS  Celexa, Wellbutrin, Trazodone  Disposition: Remain inpatient-may need transfer to a tertiary care center  DVT Prophylaxis: Prophylactic Heparin  Code Status: Full code  Family Communication None at bedside  Procedures:  None  CONSULTS:  Surgery GI   MEDICATIONS: Scheduled  Meds: . ALPRAZolam  0.5 mg Oral QHS  . aspirin EC  81 mg Oral Daily  . azaTHIOprine  50 mg Oral BID  . carvedilol  3.125 mg Oral BID WC  . citalopram  20 mg Oral QPM  . clopidogrel  75 mg Oral Q breakfast  . cycloSPORINE  1 drop Both Eyes TID  . gabapentin  300 mg Oral BID  . heparin  5,000 Units Subcutaneous Q8H  . hydroxychloroquine  200 mg Oral BID  . insulin aspart  0-20 Units Subcutaneous Q4H  . isosorbide-hydrALAZINE  1 tablet Oral TID  . losartan  50 mg Oral Daily  . pantoprazole (PROTONIX) IV  40 mg Intravenous BID  . predniSONE  5 mg Oral Q breakfast  . traZODone  100 mg Oral QHS   Continuous Infusions: . dextrose 5 % and 0.9 % NaCl with KCl 20 mEq/L 100 mL/hr at 02/28/13 0516   PRN Meds:.albuterol, dextrose, hydrALAZINE, HYDROmorphone (DILAUDID) injection, LORazepam, ondansetron (ZOFRAN) IV, ondansetron  Antibiotics: Anti-infectives   Start     Dose/Rate Route Frequency Ordered Stop   02/26/13 0130  hydroxychloroquine (PLAQUENIL) tablet 200 mg     200 mg Oral 2 times daily 02/26/13 0041         PHYSICAL EXAM: Vital signs in last 24 hours: Filed Vitals:   02/27/13 1033 02/27/13 1350 02/27/13 2030 02/28/13 0525  BP: 126/58 126/74 121/66 151/71  Pulse: 92 84 78 83  Temp:  98.7 F (37.1 C) 99.5 F (37.5 C) 99 F (37.2 C)  TempSrc:  Oral Oral Oral  Resp:  2 20 18   Height:  Weight:      SpO2:  95% 100% 99%    Weight change:  Filed Weights   02/25/13 1915 02/26/13 0107  Weight: 177.356 kg (391 lb) 178.9 kg (394 lb 6.5 oz)   Body mass index is 59.98 kg/(m^2).   Gen Exam: Morbidly obese female.  Awake and alert with clear speech.  NAD. Neck: Supple, No JVD. Chest: B/L Clear. CVS: S1 S2 Regular, no murmurs. Abdomen: soft, BS +, tenderness to epigastric region, RUQ and LUQ, non distended Extremities: no edema, lower extremities warm to touch. Neurologic: Non Focal. Skin: No Rash.   Intake/Output from previous day:  Intake/Output Summary (Last 24  hours) at 02/28/13 1119 Last data filed at 02/28/13 0600  Gross per 24 hour  Intake 5193.33 ml  Output      0 ml  Net 5193.33 ml     LAB RESULTS: CBC  Recent Labs Lab 02/22/13 0638 02/25/13 1918 02/26/13 0430 02/27/13 0615 02/28/13 0515  WBC 9.2 12.4* 9.2 11.8* 12.1*  HGB 10.4* 11.0* 9.5* 9.7* 8.5*  HCT 31.3* 32.3* 29.5* 30.1* 26.3*  PLT 291 299 257 247 249  MCV 82.8 81.8 83.1 83.8 84.0  MCH 27.5 27.8 26.8 27.0 27.2  MCHC 33.2 34.1 32.2 32.2 32.3  RDW 15.6* 15.0 15.6* 15.6* 15.5    Chemistries   Recent Labs Lab 02/22/13 0638 02/23/13 0519 02/25/13 1918 02/26/13 0430 02/28/13 0515  NA 139 140 139 139 139  K 3.4* 3.7 3.3* 3.8 3.9  CL 103 103 103 105 107  CO2 23 29 26 23 26   GLUCOSE 136* 138* 42* 102* 102*  BUN 7 6 8 7 6   CREATININE 1.15* 1.20* 0.96 1.04 0.99  CALCIUM 8.7 9.0 9.4 8.8 8.6    CBG:  Recent Labs Lab 02/27/13 1641 02/27/13 1958 02/27/13 2342 02/28/13 0409 02/28/13 0737  GLUCAP 164* 171* 114* 110* 106*    GFR Estimated Creatinine Clearance: 128.4 ml/min (by C-G formula based on Cr of 0.99).  Coagulation profile  Recent Labs Lab 02/26/13 0430 02/28/13 0515  INR 1.05 1.10    Cardiac Enzymes  Recent Labs Lab 02/26/13 0843  TROPONINI <0.30    Recent Labs  02/25/13 2144  LIPASE 20    Urine Studies No results found for this basename: UACOL, UAPR, USPG, UPH, UTP, UGL, UKET, UBIL, UHGB, UNIT, UROB, ULEU, UEPI, UWBC, URBC, UBAC, CAST, CRYS, UCOM, BILUA,  in the last 72 hours  MICROBIOLOGY: Recent Results (from the past 240 hour(s))  URINE CULTURE     Status: None   Collection Time    02/18/13  7:27 PM      Result Value Range Status   Specimen Description URINE, CLEAN CATCH   Final   Special Requests NONE   Final   Culture  Setup Time 02/18/2013 21:03   Final   Colony Count NO GROWTH   Final   Culture NO GROWTH   Final   Report Status 02/20/2013 FINAL   Final  URINE CULTURE     Status: None   Collection Time     02/20/13  8:01 PM      Result Value Range Status   Specimen Description URINE, RANDOM   Final   Special Requests NONE   Final   Culture  Setup Time 02/21/2013 02:32   Final   Colony Count NO GROWTH   Final   Culture NO GROWTH   Final   Report Status 02/21/2013 FINAL   Final    RADIOLOGY STUDIES/RESULTS: Ct Abdomen  Pelvis W Wo Contrast  02/20/2013   *RADIOLOGY REPORT*  Clinical Data: Chest pain and upper abdominal pain with nausea and vomiting.  Shortness of breath.  CT ABDOMEN AND PELVIS WITHOUT AND WITH CONTRAST  Technique:  Multidetector CT imaging of the abdomen and pelvis was performed without contrast material in one or both body regions, followed by contrast material(s) and further sections in one or both body regions.  Contrast: OMNIPAQUE IOHEXOL 300 MG/ML  SOLN  Comparison: Ultrasound abdomen 02/20/2013.  Findings: Lung bases show scattered subsegmental atelectasis. Heart is enlarged.  No pericardial or pleural effusion.  Although the exam was intended to have arterial and portal venous phase imaging, an IV tubing malfunction occurred during contrast injection and the postcontrast images are both in the delayed nephrographic phase.  There is a heterogeneous mass in the right hepatic lobe, measuring approximately 5.9 x 7.7 cm in greatest transaxial dimension.  It bulges the liver contour slightly.  Liver measures approximately 22.6 cm.  Gallbladder, adrenal glands, kidneys, spleen, pancreas, stomach and bowel are unremarkable.  Uterus and ovaries are visualized.  No pathologically enlarged lymph nodes.  No worrisome lytic or sclerotic lesions.  IMPRESSION:  1.  Right hepatic lobe mass is poorly characterized on today's exam, for reasons given above.  Differential diagnosis is broad and includes abscess, hemangioma and neoplasm.  MR abdomen without and with contrast (preferably with Eovist) is recommended. 2.  Hepatomegaly.   Original Report Authenticated By: Leanna Battles, M.D.   Dg Chest  2 View  02/25/2013   *RADIOLOGY REPORT*  Clinical Data: Shortness of breath  CHEST - 2 VIEW  Comparison: February 20, 2013  Findings: Lungs clear.  Heart is borderline enlarged with normal pulmonary vascularity.  No adenopathy.  No bone lesions.  IMPRESSION: No edema or consolidation.  Heart borderline enlarged.   Original Report Authenticated By: Bretta Bang, M.D.   Dg Chest 2 View  02/18/2013   *RADIOLOGY REPORT*  Clinical Data: Shortness of breath  CHEST - 2 VIEW  Comparison:  October 09, 2012  Findings:  Lungs are clear.  Heart is mildly enlarged with normal pulmonary vascularity.  No adenopathy.  No bone lesions.  IMPRESSION: No edema or consolidation.  Stable mild cardiac enlargement.   Original Report Authenticated By: Bretta Bang, M.D.   Mr Abdomen W Wo Contrast  02/25/2013   **ADDENDUM** CREATED: 02/25/2013 09:05:20  These findings and recommendations were called by telephone on 02/25/2013 at 09:00 AM to PA [facility], who verbally acknowledged these results.  **END ADDENDUM** SIGNED BY: Florencia Reasons, M.D.  02/25/2013   *RADIOLOGY REPORT*  Clinical Data: Follow-up evaluation of liver lesions seen on CT examination.  MRI ABDOMEN WITH AND WITHOUT CONTRAST  Technique:  Multiplanar multisequence MR imaging of the abdomen was performed both before and after administration of intravenous contrast.  Contrast:  10 ml Eovist  Comparison: CT of the abdomen and pelvis 02/20/2013.  Comment:  Study is limited by considerable patient motion, particularly on the postcontrast images, which affects both have their quality, as well as the fidelity of the subtraction images.  Findings: Within the central aspect of the liver, predominantly involving segments 4B and 5, there is a 7.1 x 7.6 x 6.4 cm lesion and is heterogeneous in signal intensity on pregadolinium T1 and T2- weighted images.  The periphery of the lesion is predominantly T1 hyperintense and T2 hypointense, while the central aspect of the lesion  is heterogeneously T1 hypointense and generally T2 hyperintense.  Post gadolinium  imaging is limited by considerable patient motion which limits accurate assessment of internal enhancement.  There does appear to be some heterogeneous internal enhancement which is predominately peripheral and nodular, with some progressive enhancement centrally.  Delayed post gadolinium images obtained at 20 minutes demonstrates accumulation of the contrast within the liver parenchyma around the lesion, but there is relative paucity of contrast within the lesion, indicative of a lesion of non hepatocyte origin.  Within the visualized portions of the abdomen, the appearance of the gallbladder, pancreas, spleen, bilateral adrenal glands and bilateral kidneys is unremarkable.  IMPRESSION: 1.  7.1 x 7.6 x 6.4 cm lesion in segment 4B and 5 of the liver has unusual imaging characteristics, detailed above.  Given the lack of accumulation of contrast agent on delayed phase imaging with Eovist, which indicates a lesion not of hepatocellular origin, differential considerations primarily include an atypical giant cavernous hemangioma, atypical adenoma with central necrosis, or less likely a large solitary metastasis with central necrosis. Lack of enhancement on the delayed images with Eovist indicates that this is not a large focal nodular hyperplasia (FNH).  At this time, a short term follow-up MRI with Multihance (not Eovist) in 2- 3 months is recommended to assess for size stability, and to better evaluate the dynamic post-contrast enhancement characteristics of this lesion.  Original Report Authenticated By: Trudie Reed, M.D.   US Abdomen Complete  02/20/2013   *RADIOLOGY REPORT*  Abdominal ultrasound  History:  Abdominal pain  Findings:  Gallbladder is visualized in multiple projections. There are no gallstones, gallbladder wall thickening, or pericholecystic fluid collection.  There is no intrahepatic, common hepatic, or common  bile duct dilatation.  Pancreas appears normal.  In the left lobe of the liver, there is a mass-like area which is slightly hyperechoic compared the remainder of the liver measuring 7.6 x 5.2 x 7.8 cm in size.  No other focal liver lesions are identified.  Spleen is normal in size and homogeneous in echotexture.  Kidneys bilaterally appear within normal limits.  There is no ascites. Aorta is nonaneurysmal.  Inferior vena cava appears patent.  Conclusion:  Mass-like area in the left lobe of the liver.  This finding warrants further evaluation.  MRI with and without intravenous contrast would be the imaging study of choice to further assess.  Study otherwise unremarkable.   Original Report Authenticated By: Bretta Bang, M.D.   Dg Chest Portable 1 View  02/20/2013   *RADIOLOGY REPORT*  Clinical Data: Chest pain, shortness of breath  PORTABLE CHEST - 1 VIEW  Comparison: Prior radiograph from 02/18/2013  Findings: Cardiomegaly is likely unchanged allowing for differences in technique.  Mediastinal silhouette is stable.  Current examination has been performed with a similar degree of lung inflation. No frank pulmonary edema is identified.  No pleural effusion or airspace consolidation.  No pneumothorax.  Osseous structures are unchanged.  IMPRESSION: Stable cardiomegaly without frank pulmonary edema or airspace consolidation.   Original Report Authenticated By: Rise Mu, M.D.   Acute Abdominal Series  02/26/2013   *RADIOLOGY REPORT*  Clinical Data: Abdominal pain.  Nausea and vomiting.  ACUTE ABDOMEN SERIES (ABDOMEN 2 VIEW & CHEST 1 VIEW)  Comparison: Chest x-ray dated 02/25/2013 and abdominal radiographs dated 10/11/2012 and CT scan dated 02/20/2013  Findings: There is no free air or free fluid in the abdomen.  The bowel gas pattern is normal with only a small amount of air in the bowel.  There is hepatomegaly.  Chronic cardiomegaly.  Slight pulmonary vascular prominence.  IMPRESSION: No evidence of  bowel obstruction or other acute abnormality in the abdomen.  Hepatomegaly.  Chronic cardiomegaly.  Slight pulmonary vascular congestion.   Original Report Authenticated By: Francene Boyers, M.D.    Jeoffrey Massed, MD [facility]   If 7PM-7AM, please contact night-coverage [email or web address] Password [email or web address] 02/28/2013, 11:19 AM   LOS: 3 days

## 2013-02-28 NOTE — Progress Notes (Signed)
Spoke with Dr. Luna Fuse, General Surgeon at [facility].  He doubts a 7 cm mass in a person of [name]'s size is causing her nausea and vomiting.  He suggests if her HCT drops dramatically he will accept her in transfer, but otherwise he felt it would be more beneficial to the patient to be seen as an outpatient by Dr. Jola Babinski (imaging could be down at [facility] where they have bariatric equipment).  He could arrange for her to be seen by Dr. Jola Babinski as early as next Tuesday.  I will send him her demographic information via email so that he can arrange a tentative appointment for her with Dr. Jola Babinski.

## 2013-02-28 NOTE — Progress Notes (Signed)
I also spoke to the GI service PA. We have been discussing with Dr. Donell Beers as above. Patient examined and I agree with the assessment and plan  Violeta Gelinas, MD, MPH, FACS Pager: [phone]  02/28/2013 1:15 PM

## 2013-02-28 NOTE — Discharge Summary (Signed)
PATIENT DETAILS Name: [name] Age: 42 y.o. Sex: female Date of Birth: [birth year] MRN: [id number]. Admit Date: 02/25/2013 Admitting Physician: Lynden Oxford, MD NWG:NFAOZHY,QMVHQION C, MD  Recommendations for Outpatient Follow-up:  Follow up with Dr. Everardo All on 8/11 for diabetes and insulin Follow up on 7 cm liver mass with Dr. Flonnie Hailstone at [facility]  PRIMARY DISCHARGE DIAGNOSIS:  Principal Problem:   Liver mass Active Problems:   IDDM   Chronic respiratory failure   Abdominal  pain, other specified site   Lactic acidosis   Hypoglycemia   Intractable vomiting   Reflux esophagitis   Abdominal pain, epigastric   Gastroparesis      PAST MEDICAL HISTORY: Past Medical History  Diagnosis Date  . IDDM 08/09/2010  . Bell's palsy 08/09/2010  . SLE 08/09/2010  . SJOGREN'S SYNDROME 08/09/2010  . Hypertension   . High cholesterol   . CHF (congestive heart failure)   . Anginal pain   . Shortness of breath     "all the time"  . Obstructive sleep apnea on CPAP 2011  . Migraines   . Stroke 2010    "left side is still weak from it; never fully regained full strength"  . Anxiety   . GERD (gastroesophageal reflux disease)      Medication List    STOP taking these medications       acetaminophen 500 MG tablet  Commonly known as:  TYLENOL     insulin regular human CONCENTRATED 500 UNIT/ML Soln injection  Commonly known as:  HUMULIN R     potassium chloride SA 20 MEQ tablet  Commonly known as:  K-DUR,KLOR-CON      TAKE these medications       alendronate 70 MG tablet  Commonly known as:  FOSAMAX  Take 70 mg by mouth every 7 (seven) days. thursdays. Take with a full glass of water on an empty stomach.     ALPRAZolam 0.5 MG tablet  Commonly known as:  XANAX  Take 1 tablet (0.5 mg total) by mouth 3 (three) times daily as needed for sleep.     aspirin EC 81 MG tablet  Take 81 mg by mouth daily.     azaTHIOprine 50 MG tablet  Commonly known as:  IMURAN  Take 50 mg by mouth 2 (two)  times daily.     buPROPion 150 MG 12 hr tablet  Commonly known as:  WELLBUTRIN SR  Take 150 mg by mouth 2 (two) times daily.     cetirizine 10 MG tablet  Commonly known as:  ZYRTEC  Take 10 mg by mouth daily.     citalopram 20 MG tablet  Commonly known as:  CELEXA  Take 20 mg by mouth every evening.     clopidogrel 75 MG tablet  Commonly known as:  PLAVIX  Take 75 mg by mouth daily.     COREG 3.125 MG tablet  Generic drug:  carvedilol  Take 3.125 mg by mouth 2 (two) times daily with a meal.     cycloSPORINE 0.05 % ophthalmic emulsion  Commonly known as:  RESTASIS  Place 1 drop into both eyes 3 (three) times daily.     famotidine 20 MG tablet  Commonly known as:  PEPCID  Take 20 mg by mouth 2 (two) times daily.     gabapentin 300 MG capsule  Commonly known as:  NEURONTIN  Take 300 mg by mouth 2 (two) times daily.     hydroxychloroquine 200 MG tablet  Commonly known as:  PLAQUENIL  Take 200 mg by mouth 2 (two) times daily.     insulin aspart 100 UNIT/ML Sopn FlexPen  Commonly known as:  NOVOLOG FLEXPEN  - Inject 3 Units into the skin 3 (three) times daily with meals. In addition to 3 units with each meal add the following sliding scale coverage:  -  121-150 = 1 unit  -  151 - 200 = 2 units  -  201 - 250 = 3 units  -  251 - 300 = 5 units  -  301 - 350 = 7 units  -  351 - 400 = 9 units     isosorbide-hydrALAZINE 20-37.5 MG per tablet  Commonly known as:  BIDIL  Take 1 tablet by mouth 3 (three) times daily.     losartan 100 MG tablet  Commonly known as:  COZAAR  Take 50 mg by mouth daily.     metoCLOPramide 10 MG tablet  Commonly known as:  REGLAN  Take 1 tablet (10 mg total) by mouth 4 (four) times daily -  before meals and at bedtime.     ondansetron 4 MG tablet  Commonly known as:  ZOFRAN  Take 1 tablet (4 mg total) by mouth every 6 (six) hours as needed for nausea.     pantoprazole 40 MG tablet  Commonly known as:  PROTONIX  Take 1 tablet (40 mg  total) by mouth 2 (two) times daily.     polyethylene glycol packet  Commonly known as:  MIRALAX / GLYCOLAX  Take 17 g by mouth 2 (two) times daily.     potassium chloride 10 MEQ tablet  Commonly known as:  K-DUR  Take 1 tablet (10 mEq total) by mouth daily.     pravastatin 10 MG tablet  Commonly known as:  PRAVACHOL  Take 10 mg by mouth at bedtime.     predniSONE 5 MG tablet  Commonly known as:  DELTASONE  Take 1 tablet (5 mg total) by mouth daily with breakfast.     PROAIR HFA 108 (90 BASE) MCG/ACT inhaler  Generic drug:  albuterol  Inhale 2 puffs into the lungs every 6 (six) hours as needed. For shortness of breath/wheeze     senna 8.6 MG Tabs tablet  Commonly known as:  SENOKOT  Take 2 tablets (17.2 mg total) by mouth 2 (two) times daily.     torsemide 20 MG tablet  Commonly known as:  DEMADEX  Take 1 tablet (20 mg total) by mouth daily.     traZODone 100 MG tablet  Commonly known as:  DESYREL  Take 100 mg by mouth at bedtime.       ALLERGIES:   Allergies  Allergen Reactions  . Oxycodone-Acetaminophen Hives    FYI: no reaction to hydrocodone    BRIEF HPI: Patient is a 42 morbidly obese female, with a history of lupus, sjogren's, cva, DM and stroke, who was admitted for intractable vomiting and unexplained abdominal pain.   She was here 1 week  Prior to this admission, for the same symptoms, a large liver mass was found on CT at the time of admission.  She had some improvement in her symptoms and was able to eat.  She was discharged.  An MRI was done after discharge that showed a large hepatic mass in segments 4B and 5.  She presented back to the ED on 7/28 with worsening  nausea and vomiting as well as dehydration.  She continues to have RUQ and epigastric  pain which is currently controlled.  CONSULTATIONS:   Surgery, Gastroenterology, Interventional Radiology  PERTINENT RADIOLOGIC STUDIES: Ct Abdomen Pelvis W Wo Contrast  02/20/2013   *RADIOLOGY REPORT*   Clinical Data: Chest pain and upper abdominal pain with nausea and vomiting.  Shortness of breath.  CT ABDOMEN AND PELVIS WITHOUT AND WITH CONTRAST  Technique:  Multidetector CT imaging of the abdomen and pelvis was performed without contrast material in one or both body regions, followed by contrast material(s) and further sections in one or both body regions.  Contrast: OMNIPAQUE IOHEXOL 300 MG/ML  SOLN  Comparison: Ultrasound abdomen 02/20/2013.  Findings: Lung bases show scattered subsegmental atelectasis. Heart is enlarged.  No pericardial or pleural effusion.  Although the exam was intended to have arterial and portal venous phase imaging, an IV tubing malfunction occurred during contrast injection and the postcontrast images are both in the delayed nephrographic phase.  There is a heterogeneous mass in the right hepatic lobe, measuring approximately 5.9 x 7.7 cm in greatest transaxial dimension.  It bulges the liver contour slightly.  Liver measures approximately 22.6 cm.  Gallbladder, adrenal glands, kidneys, spleen, pancreas, stomach and bowel are unremarkable.  Uterus and ovaries are visualized.  No pathologically enlarged lymph nodes.  No worrisome lytic or sclerotic lesions.  IMPRESSION:  1.  Right hepatic lobe mass is poorly characterized on today's exam, for reasons given above.  Differential diagnosis is broad and includes abscess, hemangioma and neoplasm.  MR abdomen without and with contrast (preferably with Eovist) is recommended. 2.  Hepatomegaly.   Original Report Authenticated By: Leanna Battles, M.D.   Dg Chest 2 View  02/25/2013   *RADIOLOGY REPORT*  Clinical Data: Shortness of breath  CHEST - 2 VIEW  Comparison: February 20, 2013  Findings: Lungs clear.  Heart is borderline enlarged with normal pulmonary vascularity.  No adenopathy.  No bone lesions.  IMPRESSION: No edema or consolidation.  Heart borderline enlarged.   Original Report Authenticated By: Bretta Bang, M.D.   Dg Chest  2 View  02/18/2013   *RADIOLOGY REPORT*  Clinical Data: Shortness of breath  CHEST - 2 VIEW  Comparison:  October 09, 2012  Findings:  Lungs are clear.  Heart is mildly enlarged with normal pulmonary vascularity.  No adenopathy.  No bone lesions.  IMPRESSION: No edema or consolidation.  Stable mild cardiac enlargement.   Original Report Authenticated By: Bretta Bang, M.D.   Mr Abdomen W Wo Contrast  02/25/2013   **ADDENDUM** CREATED: 02/25/2013 09:05:20  These findings and recommendations were called by telephone on 02/25/2013 at 09:00 AM to PA [facility], who verbally acknowledged these results.  **END ADDENDUM** SIGNED BY: Florencia Reasons, M.D.  02/25/2013   *RADIOLOGY REPORT*  Clinical Data: Follow-up evaluation of liver lesions seen on CT examination.  MRI ABDOMEN WITH AND WITHOUT CONTRAST  Technique:  Multiplanar multisequence MR imaging of the abdomen was performed both before and after administration of intravenous contrast.  Contrast:  10 ml Eovist  Comparison: CT of the abdomen and pelvis 02/20/2013.  Comment:  Study is limited by considerable patient motion, particularly on the postcontrast images, which affects both have their quality, as well as the fidelity of the subtraction images.  Findings: Within the central aspect of the liver, predominantly involving segments 4B and 5, there is a 7.1 x 7.6 x 6.4 cm lesion and is heterogeneous in signal intensity on pregadolinium T1 and T2- weighted images.  The periphery of the lesion is predominantly T1 hyperintense and T2  hypointense, while the central aspect of the lesion is heterogeneously T1 hypointense and generally T2 hyperintense.  Post gadolinium imaging is limited by considerable patient motion which limits accurate assessment of internal enhancement.  There does appear to be some heterogeneous internal enhancement which is predominately peripheral and nodular, with some progressive enhancement centrally.  Delayed post gadolinium images  obtained at 20 minutes demonstrates accumulation of the contrast within the liver parenchyma around the lesion, but there is relative paucity of contrast within the lesion, indicative of a lesion of non hepatocyte origin.  Within the visualized portions of the abdomen, the appearance of the gallbladder, pancreas, spleen, bilateral adrenal glands and bilateral kidneys is unremarkable.  IMPRESSION: 1.  7.1 x 7.6 x 6.4 cm lesion in segment 4B and 5 of the liver has unusual imaging characteristics, detailed above.  Given the lack of accumulation of contrast agent on delayed phase imaging with Eovist, which indicates a lesion not of hepatocellular origin, differential considerations primarily include an atypical giant cavernous hemangioma, atypical adenoma with central necrosis, or less likely a large solitary metastasis with central necrosis. Lack of enhancement on the delayed images with Eovist indicates that this is not a large focal nodular hyperplasia (FNH).  At this time, a short term follow-up MRI with Multihance (not Eovist) in 2- 3 months is recommended to assess for size stability, and to better evaluate the dynamic post-contrast enhancement characteristics of this lesion.  Original Report Authenticated By: Trudie Reed, M.D.   US Abdomen Complete  02/20/2013   *RADIOLOGY REPORT*  Abdominal ultrasound  History:  Abdominal pain  Findings:  Gallbladder is visualized in multiple projections. There are no gallstones, gallbladder wall thickening, or pericholecystic fluid collection.  There is no intrahepatic, common hepatic, or common bile duct dilatation.  Pancreas appears normal.  In the left lobe of the liver, there is a mass-like area which is slightly hyperechoic compared the remainder of the liver measuring 7.6 x 5.2 x 7.8 cm in size.  No other focal liver lesions are identified.  Spleen is normal in size and homogeneous in echotexture.  Kidneys bilaterally appear within normal limits.  There is no  ascites. Aorta is nonaneurysmal.  Inferior vena cava appears patent.  Conclusion:  Mass-like area in the left lobe of the liver.  This finding warrants further evaluation.  MRI with and without intravenous contrast would be the imaging study of choice to further assess.  Study otherwise unremarkable.   Original Report Authenticated By: Bretta Bang, M.D.   Dg Chest Portable 1 View  02/20/2013   *RADIOLOGY REPORT*  Clinical Data: Chest pain, shortness of breath  PORTABLE CHEST - 1 VIEW  Comparison: Prior radiograph from 02/18/2013  Findings: Cardiomegaly is likely unchanged allowing for differences in technique.  Mediastinal silhouette is stable.  Current examination has been performed with a similar degree of lung inflation. No frank pulmonary edema is identified.  No pleural effusion or airspace consolidation.  No pneumothorax.  Osseous structures are unchanged.  IMPRESSION: Stable cardiomegaly without frank pulmonary edema or airspace consolidation.   Original Report Authenticated By: Rise Mu, M.D.   Acute Abdominal Series  02/26/2013   *RADIOLOGY REPORT*  Clinical Data: Abdominal pain.  Nausea and vomiting.  ACUTE ABDOMEN SERIES (ABDOMEN 2 VIEW & CHEST 1 VIEW)  Comparison: Chest x-ray dated 02/25/2013 and abdominal radiographs dated 10/11/2012 and CT scan dated 02/20/2013  Findings: There is no free air or free fluid in the abdomen.  The bowel gas pattern is normal with only  a small amount of air in the bowel.  There is hepatomegaly.  Chronic cardiomegaly.  Slight pulmonary vascular prominence.  IMPRESSION: No evidence of bowel obstruction or other acute abnormality in the abdomen.  Hepatomegaly.  Chronic cardiomegaly.  Slight pulmonary vascular congestion.   Original Report Authenticated By: Francene Boyers, M.D.     PERTINENT LAB RESULTS: CBC:  Recent Labs  03/02/13 0810  WBC 9.9  HGB 8.7*  HCT 26.4*  PLT 299   CMET CMP     Component Value Date/Time   NA 139 03/03/2013 0600    K 3.8 03/03/2013 0600   CL 103 03/03/2013 0600   CO2 28 03/03/2013 0600   GLUCOSE 99 03/03/2013 0600   BUN 5* 03/03/2013 0600   CREATININE 0.98 03/03/2013 0600   CALCIUM 9.0 03/03/2013 0600   PROT 6.0 03/03/2013 0600   ALBUMIN 2.3* 03/03/2013 0600   AST 9 03/03/2013 0600   ALT 50* 03/03/2013 0600   ALKPHOS 88 03/03/2013 0600   BILITOT 0.3 03/03/2013 0600   GFRNONAA 70* 03/03/2013 0600   GFRAA 81* 03/03/2013 0600     BRIEF HOSPITAL COURSE:   Abdominal pain and vomiting  Etiology not very certain-EGD basically negative. Not able to do a HIDA scan-patient's weight beyond the capability for HIDA scan, however doubt pain from gallbladder etiology. Not sure if the right upper quadrant pain is from mass effect from a known liver mass, or perhaps from gastroparesis . Patient was seen by gastroenterology, and endoscopy done on 7/29 did not reveal any major abnormalities. Patient was empirically started on IV Reglan, PPI, was given as needed narcotics for pain. With these measures, patient has significantly improved. We will convert IV Reglan to oral, and educate the patient regarding small portion meals.  - Regarding the hepatic mass, we have been in touch with Dr. Deveron Furlong at [facility], he will look at the patient's MRI of the abdomen and see if their facility can offer any further input.However since the patient has improved, and is currently pain free and tolerating a diet, we have now decided to discharge her home, and have her follow up with Dr Flonnie Hailstone at his clinic on 8/15. - GI and general surgery did follow the patient during her hospital stay here.   Transaminitis  - improving - Suspect this is from liver mass-and mass effect  - Avoid hepatotoxic medications   Hepatic Mass  -7.1 x 7.6 x 6.4 cm as noted on MRI Liver 7/27. Felt to be benign cavernous hemangioma or adenoma -Concern for continued bleeding into the hemangioma, enlarging the mass and worsening her pain.However this is likely NOT the case, and may be  just an incidental finding. Will defer further work up to Dr Flonnie Hailstone. -Patient was able to wean off pain medications completely and will be seen at [facility] in follow up (Dr. Flonnie Hailstone) -Please note patient was given a hard copy of the MRI abd to take with her to Dr Rondel Baton office on 8/15  IDDM  -Hypoglycemic on admission.  -Recently on U500.  -Last admission CBGs did not indicate she needed U500.  -Currently on SSI-Sensitive, with meal time coverage (3 units).  -Will follow up with Dr. Everardo All on 8/11 outpatient.  Lactic Acidosis  -Secondary to vomiting  -Resolved with IVF   Hypertension  - required Hydralazine IV PRN while vomiting.  -Continue Bidil, Cozaar, Coreg   -restart demadex and potassium at discharge.  SLE / Sjogren's  -Continue Imuran, Plaquenil, Prednisone   -Follows with Dr.  M[facility]ra at [facility]   H/O Stroke  -Remained stable. Continue Plavix   Anxiety  -Xanax QHS  -Celexa, Wellbutrin, Trazodone    TODAY-DAY OF DISCHARGE:  Subjective:   Patient feels much better.  Tolerating soft diet.  No pain meds since Saturday.  Doing well on reglan.  Objective:   Blood pressure 158/89, pulse 87, temperature 99.2 F (37.3 C), temperature source Oral, resp. rate 20, height 5\' 8"  (1.727 m), weight 181.4 kg (399 lb 14.6 oz), last menstrual period 02/25/2013, SpO2 97.00%.  Intake/Output Summary (Last 24 hours) at 03/04/13 1130 Last data filed at 03/04/13 0900  Gross per 24 hour  Intake    600 ml  Output      0 ml  Net    600 ml   Filed Weights   03/02/13 0500 03/03/13 0442 03/04/13 0420  Weight: 187 kg (412 lb 4.2 oz) 186.7 kg (411 lb 9.6 oz) 181.4 kg (399 lb 14.6 oz)    Exam General:  Awake Alert, Oriented *3, No new F.N deficits, Normal affect Neck:  Supple ,No JVD, No cervical lymphadenopathy appriciated.  Lungs:  Symmetrical Chest wall movement, Good air movement bilaterally, CTAB CV:  RRR,No Gallops,Rubs or new Murmurs, No Parasternal Heave Abdomen:  Massively obese,  Positive B.Sounds, Abd Soft, RUQ and epigastric tenderness, No organomegaly appriciated, No rebound, guarding or rigidity. Extremities:  No Cyanosis, Clubbing or edema, No new Rash or bruise  DISCHARGE CONDITION: Stable  DISPOSITION: To home with follow up.  DISCHARGE INSTRUCTIONS:    Activity:  As tolerated  Diet recommendation: Soft, low residue.  Discharge Orders   Future Appointments Provider Department Dept Phone   05/21/2013 9:45 AM Romero Belling, MD [facility] PRIMARY CARE ENDOCRINOLOGY [phone]   Future Orders Complete By Expires     Gastroparesis diet As directed     Increase activity slowly  As directed        Follow-up Information   Follow up with Romero Belling, MD On 03/11/2013. (11:30 )    Contact information:   [address] [place] [address] [phone]       Follow up with Deveron Furlong, MD On 03/15/2013. (2:15 to see Dr Flonnie Hailstone)    Contact information:   [facility] [address] [phone] option [phone]      Total Time spent on discharge equals 45 minutes.  Signed: Rema Fendt, PA-S Michele Rockers , PA-C [facility] 03/04/2013 11:30 AM  Attending Patient seen and examined, agree with the above assessment and plan. Of note was reviewed, necessary changes were made. Patient has been almost pain free for the past 48 hours, is tolerating a small portion meals without nausea vomiting. Abdomen is soft. Suspect she can be discharged today, with outpatient followup with surgery at [facility] as outlined above.  Windell Norfolk MD

## 2013-02-28 NOTE — Progress Notes (Signed)
Patient ID: Daizee Firmin, female   DOB: February 11, 1971, 42 y.o.   MRN: [id number] 2 Days Post-Op  Subjective: Pt still having pain and nausea.  Otherwise no new complaints  Objective: Vital signs in last 24 hours: Temp:  [98.7 F (37.1 C)-99.5 F (37.5 C)] 99 F (37.2 C) (07/31 0525) Pulse Rate:  [78-92] 83 (07/31 0525) Resp:  [2-20] 18 (07/31 0525) BP: (121-151)/(58-74) 151/71 mmHg (07/31 0525) SpO2:  [95 %-100 %] 99 % (07/31 0525) Last BM Date: 02/25/13  Intake/Output from previous day: 07/30 0701 - 07/31 0700 In: 5193.3 [P.O.:480; I.V.:4713.3] Out: -  Intake/Output this shift:    PE: Abd: soft, tender in RUQ, epigastrium, and some LUQ, obese, +BS  Lab Results:   Recent Labs  02/27/13 0615 02/28/13 0515  WBC 11.8* 12.1*  HGB 9.7* 8.5*  HCT 30.1* 26.3*  PLT 247 249   BMET  Recent Labs  02/26/13 0430 02/28/13 0515  NA 139 139  K 3.8 3.9  CL 105 107  CO2 23 26  GLUCOSE 102* 102*  BUN 7 6  CREATININE 1.04 0.99  CALCIUM 8.8 8.6   PT/INR  Recent Labs  02/26/13 0430 02/28/13 0515  LABPROT 13.5 14.0  INR 1.05 1.10   CMP     Component Value Date/Time   NA 139 02/28/2013 0515   K 3.9 02/28/2013 0515   CL 107 02/28/2013 0515   CO2 26 02/28/2013 0515   GLUCOSE 102* 02/28/2013 0515   BUN 6 02/28/2013 0515   CREATININE 0.99 02/28/2013 0515   CALCIUM 8.6 02/28/2013 0515   PROT 5.7* 02/28/2013 0515   ALBUMIN 2.3* 02/28/2013 0515   AST 62* 02/28/2013 0515   ALT 166* 02/28/2013 0515   ALKPHOS 96 02/28/2013 0515   BILITOT 0.5 02/28/2013 0515   GFRNONAA 69* 02/28/2013 0515   GFRAA 80* 02/28/2013 0515   Lipase     Component Value Date/Time   LIPASE 20 02/25/2013 2144       Studies/Results: No results found.  Anti-infectives: Anti-infectives   Start     Dose/Rate Route Frequency Ordered Stop   02/26/13 0130  hydroxychloroquine (PLAQUENIL) tablet 200 mg     200 mg Oral 2 times daily 02/26/13 0041         Assessment/Plan  1. Liver mass, hemangioma vs adenoma  vs solitary met lesion Patient Active Problem List   Diagnosis Date Noted  . Abdominal  pain, other specified site 02/26/2013  . Lactic acidosis 02/26/2013  . Hypoglycemia 02/26/2013  . Intractable vomiting 02/26/2013  . Reflux esophagitis 02/26/2013  . Abdominal pain, epigastric 02/26/2013  . UTI (urinary tract infection) 02/18/2013  . Morbid obesity 10/10/2012  . Chronic diastolic heart failure 10/09/2012  . CVA (cerebral infarction) 10/09/2012  . Chronic respiratory failure 10/09/2012  . Shortness of breath 11/08/2011  . Chest pain 11/08/2011  . Leukocytosis 11/08/2011  . Anemia 11/08/2011  . DM (diabetes mellitus) 11/08/2011  . HTN (hypertension) 11/08/2011  . Systolic CHF, acute on chronic 11/08/2011  . Community acquired pneumonia 11/08/2011  . IDDM 08/09/2010  . Bell's palsy 08/09/2010  . SLE 08/09/2010  . SJOGREN'S SYNDROME 08/09/2010   Plan: 1. I have discussed this case thoroughly with Dr. Donell Beers and several different IR docs.  There is an option to biopsy this mass; however, the patient, due to her multiple co-morbidities, is not a candidate for any type of elective procedure such as surgery or radiographic ablations or embolizations.  Therefore, the indication to biopsy is a moot point  because no treatment can be given even if we got a tissue diagnosis.  If the patient wanted to pursue further evaluation at a tertiary care facility she could. 2. The pain she has is likely from capsular stretch. Hopefully as the central area necroses, this will shrink some and decrease her pain and symptoms.  Her hgb did drop from 9.7 to 8.5 though today.  It is possible that she still may be oozing some into this mass.  Follow hgb.  Otherwise, for now she will have to be treated symptomatically. 3. Recommend holding plavix secondary to decreasing hgb.  LOS: 3 days    Kellan Raffield E 02/28/2013, 8:01 AM Pager: [phone]

## 2013-03-01 HISTORY — PX: LIVER BIOPSY: SHX301

## 2013-03-01 LAB — CBC
HCT: 25.7 % — ABNORMAL LOW (ref 36.0–46.0)
Hemoglobin: 8.4 g/dL — ABNORMAL LOW (ref 12.0–15.0)
MCH: 27.5 pg (ref 26.0–34.0)
MCV: 84.3 fL (ref 78.0–100.0)
RBC: 3.05 MIL/uL — ABNORMAL LOW (ref 3.87–5.11)

## 2013-03-01 LAB — GLUCOSE, CAPILLARY: Glucose-Capillary: 107 mg/dL — ABNORMAL HIGH (ref 70–99)

## 2013-03-01 LAB — HEPATIC FUNCTION PANEL
AST: 23 U/L (ref 0–37)
Albumin: 2.2 g/dL — ABNORMAL LOW (ref 3.5–5.2)
Total Protein: 5.8 g/dL — ABNORMAL LOW (ref 6.0–8.3)

## 2013-03-01 MED ORDER — HYDROCODONE-ACETAMINOPHEN 10-325 MG PO TABS: 1.0000 | ORAL_TABLET | Freq: Four times a day (QID) | ORAL | Status: DC | PRN

## 2013-03-01 MED ORDER — METOCLOPRAMIDE HCL 5 MG/ML IJ SOLN
10.0000 mg | Freq: Three times a day (TID) | INTRAMUSCULAR | Status: DC
  Filled 2013-03-01 (×3): qty 2

## 2013-03-01 MED ORDER — INSULIN ASPART 100 UNIT/ML ~~LOC~~ SOLN
3.0000 [IU] | Freq: Three times a day (TID) | SUBCUTANEOUS | Status: DC
  Administered 2013-03-01 – 2013-03-04 (×9): 3 [IU] via SUBCUTANEOUS

## 2013-03-01 MED ORDER — INSULIN ASPART 100 UNIT/ML ~~LOC~~ SOLN: 3.0000 [IU] | Freq: Three times a day (TID) | SUBCUTANEOUS | Status: DC

## 2013-03-01 MED ORDER — PANTOPRAZOLE SODIUM 40 MG PO TBEC: 40.0000 mg | DELAYED_RELEASE_TABLET | Freq: Two times a day (BID) | ORAL | Status: DC

## 2013-03-01 MED ORDER — INSULIN ASPART 100 UNIT/ML ~~LOC~~ SOLN
0.0000 [IU] | Freq: Three times a day (TID) | SUBCUTANEOUS | Status: DC
  Administered 2013-03-01 – 2013-03-02 (×2): 2 [IU] via SUBCUTANEOUS
  Administered 2013-03-02 – 2013-03-03 (×2): 1 [IU] via SUBCUTANEOUS
  Administered 2013-03-03: 2 [IU] via SUBCUTANEOUS
  Administered 2013-03-04: 1 [IU] via SUBCUTANEOUS

## 2013-03-01 MED ORDER — KETOROLAC TROMETHAMINE 30 MG/ML IJ SOLN: 30.0000 mg | Freq: Three times a day (TID) | INTRAMUSCULAR | Status: DC | PRN

## 2013-03-01 MED ORDER — SENNA 8.6 MG PO TABS: 2.0000 | ORAL_TABLET | Freq: Two times a day (BID) | ORAL | Status: DC

## 2013-03-01 MED ORDER — ALPRAZOLAM 0.5 MG PO TABS: 0.5000 mg | ORAL_TABLET | Freq: Three times a day (TID) | ORAL | Status: DC | PRN

## 2013-03-01 MED ORDER — PANTOPRAZOLE SODIUM 40 MG PO TBEC
40.0000 mg | DELAYED_RELEASE_TABLET | Freq: Two times a day (BID) | ORAL | Status: DC
  Administered 2013-03-01 – 2013-03-04 (×6): 40 mg via ORAL
  Filled 2013-03-01 (×5): qty 1

## 2013-03-01 MED ORDER — METOCLOPRAMIDE HCL 5 MG/ML IJ SOLN
10.0000 mg | Freq: Three times a day (TID) | INTRAMUSCULAR | Status: DC
  Administered 2013-03-01 – 2013-03-03 (×7): 10 mg via INTRAVENOUS
  Filled 2013-03-01 (×11): qty 2

## 2013-03-01 MED ORDER — METOCLOPRAMIDE HCL 5 MG/ML IJ SOLN
10.0000 mg | Freq: Three times a day (TID) | INTRAMUSCULAR | Status: DC
  Filled 2013-03-01 (×4): qty 2

## 2013-03-01 MED ORDER — KETOROLAC TROMETHAMINE 30 MG/ML IJ SOLN
30.0000 mg | Freq: Three times a day (TID) | INTRAMUSCULAR | Status: DC | PRN
  Administered 2013-03-01 – 2013-03-02 (×2): 30 mg via INTRAVENOUS
  Filled 2013-03-01 (×3): qty 1

## 2013-03-01 MED ORDER — INSULIN ASPART 100 UNIT/ML ~~LOC~~ SOLN: 0.0000 [IU] | Freq: Three times a day (TID) | SUBCUTANEOUS | Status: DC

## 2013-03-01 MED ORDER — POLYETHYLENE GLYCOL 3350 17 G PO PACK: 17.0000 g | PACK | Freq: Two times a day (BID) | ORAL | Status: DC

## 2013-03-01 MED ORDER — METOCLOPRAMIDE HCL 5 MG/ML IJ SOLN: 10.0000 mg | Freq: Three times a day (TID) | INTRAMUSCULAR | Status: DC

## 2013-03-01 MED ORDER — HYDROCODONE-ACETAMINOPHEN 10-325 MG PO TABS
1.0000 | ORAL_TABLET | Freq: Four times a day (QID) | ORAL | Status: DC | PRN
  Administered 2013-03-01: 2 via ORAL
  Filled 2013-03-01: qty 2

## 2013-03-01 NOTE — Progress Notes (Addendum)
PATIENT DETAILS Name: [name] Age: 42 y.o. Sex: female Date of Birth: Aug 23, 1970 Admit Date: 02/25/2013 Admitting Physician Lynden Oxford, MD ZOX:[id number] C, MD  Subjective: She continues to have some RUQ pain this AM, she hasn't requested pain meds since last night.  She ate solids this morning and states she was having some pain and nausea after eating but has not thrown up since 7/30.    Assessment/Plan:  Abdominal pain and vomiting  -RUQ pain -Suspect Gastropareis -Etiology not certain-EGD shows reflux esophagitis but no other positive findings. Not able to do a HIDA scan-patient's weight beyond the capability for HIDA scan.  -Hepatic mass recently found on imaging was initially thought to be the cause of patients symptoms.  Surgery consulted for biopsy of mass, however surgery can't be done at this facility due to patient's risk factors.  Due to size of mass and weight of patient, it is unlikely that the mass is the cause of patients symptoms.   -Continuing to search for facility that will accept her for further evaluation -Continue with supportive care -If she improves she can be discharged for outpatient follow up at a tertiary care center  Transaminitis  -trending down. -Suspect this is from liver mass-and mass effect  -Avoid hepatotoxic medications   Hepatic Mass  -7.1 x 7.6 x 6.4 cm as noted on MRI Liver 7/27. Felt to be benign by radiologist.  -Recommended follow up in 2 - 3 months with repeat MRI  -Current suspicion for a large cavernous hemangioma expanding and causing mass effect vs adenoma caused by a vaginal estrogen ring.  IDDM  -Hypoglycemic on admission.  -Recently on U500.  -Last admission CBGs did not indicate she needed U500.  -Currently requiring very low doses of insulin (0 - 4 units at each cbg check) -Will convert her to CBG AC & HS and put her on meal coverage with SSI - Sensitive. -Will make recommendations at discharge & discuss with Dr.  Everardo All   Lactic Acidosis  -Secondary to vomiting  -Resolved with IVF   Hypertension  -Hydralazine IV PRN while vomiting.  -Continue Bidil, Cozaar, Coreg when able.   SLE / Sjogren's  -Continue Imuran, Plaquenil, Prednisone when able  -Follows with Dr. Lanell Matar at [facility]   H/O Stroke  -Continue Plavix   Anxiety  -Xanax QHS  -Celexa, Wellbutrin, Trazodone  Disposition: Remain inpatient  DVT Prophylaxis: Prophylactic Heparin  Code Status: Full code  Family Communication None at bedside  Procedures:  None  CONSULTS:  Surgery, GI   MEDICATIONS: Scheduled Meds: . ALPRAZolam  0.5 mg Oral QHS  . aspirin EC  81 mg Oral Daily  . azaTHIOprine  50 mg Oral BID  . carvedilol  3.125 mg Oral BID WC  . citalopram  20 mg Oral QPM  . clopidogrel  75 mg Oral Q breakfast  . cycloSPORINE  1 drop Both Eyes TID  . gabapentin  300 mg Oral BID  . heparin  5,000 Units Subcutaneous Q8H  . hydroxychloroquine  200 mg Oral BID  . insulin aspart  0-9 Units Subcutaneous TID WC  . insulin aspart  3 Units Subcutaneous TID WC  . isosorbide-hydrALAZINE  1 tablet Oral TID  . losartan  50 mg Oral Daily  . metoCLOPramide (REGLAN) injection  10 mg Intravenous TID AC & HS  . pantoprazole  40 mg Oral BID  . polyethylene glycol  17 g Oral BID  . predniSONE  5 mg Oral Q breakfast  . senna  2 tablet  Oral BID  . traZODone  100 mg Oral QHS   Continuous Infusions:   PRN Meds:.albuterol, dextrose, hydrALAZINE, HYDROcodone-acetaminophen, ketorolac, LORazepam, ondansetron (ZOFRAN) IV, ondansetron  Antibiotics: Anti-infectives   Start     Dose/Rate Route Frequency Ordered Stop   02/26/13 0130  hydroxychloroquine (PLAQUENIL) tablet 200 mg     200 mg Oral 2 times daily 02/26/13 0041         PHYSICAL EXAM: Vital signs in last 24 hours: Filed Vitals:   02/28/13 0525 02/28/13 1432 02/28/13 2009 03/01/13 0609  BP: 151/71 103/64 152/73 142/79  Pulse: 83 83 87 80  Temp: 99 F (37.2 C) 99 F  (37.2 C) 99.4 F (37.4 C) 99 F (37.2 C)  TempSrc: Oral Oral Oral Oral  Resp: 18 18 20 18   Height:      Weight:      SpO2: 99% 99% 100% 99%    Weight change:  Filed Weights   02/25/13 1915 02/26/13 0107  Weight: 177.356 kg (391 lb) 178.9 kg (394 lb 6.5 oz)   Body mass index is 59.98 kg/(m^2).   Gen Exam: Morbidly obese female.  Awake and alert with clear speech. In mild distress Neck: Supple, No JVD. Chest: B/L Clear. No accessory muscle movemennt CVS: S1 S2 Regular, no murmurs. Abdomen: massive, soft, BS +, tenderness to epigastric region, RUQ and LUQ, non distended. Extremities: no edema, lower extremities warm to touch. Able to move all 4. Neurologic: Non Focal.     Intake/Output from previous day: No intake or output data in the 24 hours ending 03/01/13 1134   LAB RESULTS: CBC  Recent Labs Lab 02/25/13 1918 02/26/13 0430 02/27/13 0615 02/28/13 0515 03/01/13 0450  WBC 12.4* 9.2 11.8* 12.1* 10.2  HGB 11.0* 9.5* 9.7* 8.5* 8.4*  HCT 32.3* 29.5* 30.1* 26.3* 25.7*  PLT 299 257 247 249 248  MCV 81.8 83.1 83.8 84.0 84.3  MCH 27.8 26.8 27.0 27.2 27.5  MCHC 34.1 32.2 32.2 32.3 32.7  RDW 15.0 15.6* 15.6* 15.5 15.5    Chemistries   Recent Labs Lab 02/23/13 0519 02/25/13 1918 02/26/13 0430 02/28/13 0515  NA 140 139 139 139  K 3.7 3.3* 3.8 3.9  CL 103 103 105 107  CO2 29 26 23 26   GLUCOSE 138* 42* 102* 102*  BUN 6 8 7 6   CREATININE 1.20* 0.96 1.04 0.99  CALCIUM 9.0 9.4 8.8 8.6    CBG:  Recent Labs Lab 02/28/13 2004 02/28/13 2350 03/01/13 0405 03/01/13 0754 03/01/13 1123  GLUCAP 140* 138* 107* 106* 133*     Coagulation profile  Recent Labs Lab 02/26/13 0430 02/28/13 0515  INR 1.05 1.10    Cardiac Enzymes  Recent Labs Lab 02/26/13 0843  TROPONINI <0.30    MICROBIOLOGY: Recent Results (from the past 240 hour(s))  URINE CULTURE     Status: None   Collection Time    02/20/13  8:01 PM      Result Value Range Status   Specimen  Description URINE, RANDOM   Final   Special Requests NONE   Final   Culture  Setup Time 02/21/2013 02:32   Final   Colony Count NO GROWTH   Final   Culture NO GROWTH   Final   Report Status 02/21/2013 FINAL   Final    RADIOLOGY STUDIES/RESULTS: Ct Abdomen Pelvis W Wo Contrast  02/20/2013   *RADIOLOGY REPORT*  Clinical Data: Chest pain and upper abdominal pain with nausea and vomiting.  Shortness of breath.  CT ABDOMEN  AND PELVIS WITHOUT AND WITH CONTRAST  Technique:  Multidetector CT imaging of the abdomen and pelvis was performed without contrast material in one or both body regions, followed by contrast material(s) and further sections in one or both body regions.  Contrast: OMNIPAQUE IOHEXOL 300 MG/ML  SOLN  Comparison: Ultrasound abdomen 02/20/2013.  Findings: Lung bases show scattered subsegmental atelectasis. Heart is enlarged.  No pericardial or pleural effusion.  Although the exam was intended to have arterial and portal venous phase imaging, an IV tubing malfunction occurred during contrast injection and the postcontrast images are both in the delayed nephrographic phase.  There is a heterogeneous mass in the right hepatic lobe, measuring approximately 5.9 x 7.7 cm in greatest transaxial dimension.  It bulges the liver contour slightly.  Liver measures approximately 22.6 cm.  Gallbladder, adrenal glands, kidneys, spleen, pancreas, stomach and bowel are unremarkable.  Uterus and ovaries are visualized.  No pathologically enlarged lymph nodes.  No worrisome lytic or sclerotic lesions.  IMPRESSION:  1.  Right hepatic lobe mass is poorly characterized on today's exam, for reasons given above.  Differential diagnosis is broad and includes abscess, hemangioma and neoplasm.  MR abdomen without and with contrast (preferably with Eovist) is recommended. 2.  Hepatomegaly.   Original Report Authenticated By: Leanna Battles, M.D.   Dg Chest 2 View  02/25/2013   *RADIOLOGY REPORT*  Clinical Data:  Shortness of breath  CHEST - 2 VIEW  Comparison: February 20, 2013  Findings: Lungs clear.  Heart is borderline enlarged with normal pulmonary vascularity.  No adenopathy.  No bone lesions.  IMPRESSION: No edema or consolidation.  Heart borderline enlarged.   Original Report Authenticated By: Bretta Bang, M.D.   Dg Chest 2 View  02/18/2013   *RADIOLOGY REPORT*  Clinical Data: Shortness of breath  CHEST - 2 VIEW  Comparison:  October 09, 2012  Findings:  Lungs are clear.  Heart is mildly enlarged with normal pulmonary vascularity.  No adenopathy.  No bone lesions.  IMPRESSION: No edema or consolidation.  Stable mild cardiac enlargement.   Original Report Authenticated By: Bretta Bang, M.D.   Mr Abdomen W Wo Contrast  02/25/2013   **ADDENDUM** CREATED: 02/25/2013 09:05:20  These findings and recommendations were called by telephone on 02/25/2013 at 09:00 AM to PA [facility], who verbally acknowledged these results.  **END ADDENDUM** SIGNED BY: Florencia Reasons, M.D.  02/25/2013   *RADIOLOGY REPORT*  Clinical Data: Follow-up evaluation of liver lesions seen on CT examination.  MRI ABDOMEN WITH AND WITHOUT CONTRAST  Technique:  Multiplanar multisequence MR imaging of the abdomen was performed both before and after administration of intravenous contrast.  Contrast:  10 ml Eovist  Comparison: CT of the abdomen and pelvis 02/20/2013.  Comment:  Study is limited by considerable patient motion, particularly on the postcontrast images, which affects both have their quality, as well as the fidelity of the subtraction images.  Findings: Within the central aspect of the liver, predominantly involving segments 4B and 5, there is a 7.1 x 7.6 x 6.4 cm lesion and is heterogeneous in signal intensity on pregadolinium T1 and T2- weighted images.  The periphery of the lesion is predominantly T1 hyperintense and T2 hypointense, while the central aspect of the lesion is heterogeneously T1 hypointense and generally T2  hyperintense.  Post gadolinium imaging is limited by considerable patient motion which limits accurate assessment of internal enhancement.  There does appear to be some heterogeneous internal enhancement which is predominately peripheral and nodular,  with some progressive enhancement centrally.  Delayed post gadolinium images obtained at 20 minutes demonstrates accumulation of the contrast within the liver parenchyma around the lesion, but there is relative paucity of contrast within the lesion, indicative of a lesion of non hepatocyte origin.  Within the visualized portions of the abdomen, the appearance of the gallbladder, pancreas, spleen, bilateral adrenal glands and bilateral kidneys is unremarkable.  IMPRESSION: 1.  7.1 x 7.6 x 6.4 cm lesion in segment 4B and 5 of the liver has unusual imaging characteristics, detailed above.  Given the lack of accumulation of contrast agent on delayed phase imaging with Eovist, which indicates a lesion not of hepatocellular origin, differential considerations primarily include an atypical giant cavernous hemangioma, atypical adenoma with central necrosis, or less likely a large solitary metastasis with central necrosis. Lack of enhancement on the delayed images with Eovist indicates that this is not a large focal nodular hyperplasia (FNH).  At this time, a short term follow-up MRI with Multihance (not Eovist) in 2- 3 months is recommended to assess for size stability, and to better evaluate the dynamic post-contrast enhancement characteristics of this lesion.  Original Report Authenticated By: Trudie Reed, M.D.   US Abdomen Complete  02/20/2013   *RADIOLOGY REPORT*  Abdominal ultrasound  History:  Abdominal pain  Findings:  Gallbladder is visualized in multiple projections. There are no gallstones, gallbladder wall thickening, or pericholecystic fluid collection.  There is no intrahepatic, common hepatic, or common bile duct dilatation.  Pancreas appears normal.  In  the left lobe of the liver, there is a mass-like area which is slightly hyperechoic compared the remainder of the liver measuring 7.6 x 5.2 x 7.8 cm in size.  No other focal liver lesions are identified.  Spleen is normal in size and homogeneous in echotexture.  Kidneys bilaterally appear within normal limits.  There is no ascites. Aorta is nonaneurysmal.  Inferior vena cava appears patent.  Conclusion:  Mass-like area in the left lobe of the liver.  This finding warrants further evaluation.  MRI with and without intravenous contrast would be the imaging study of choice to further assess.  Study otherwise unremarkable.   Original Report Authenticated By: Bretta Bang, M.D.   Dg Chest Portable 1 View  02/20/2013   *RADIOLOGY REPORT*  Clinical Data: Chest pain, shortness of breath  PORTABLE CHEST - 1 VIEW  Comparison: Prior radiograph from 02/18/2013  Findings: Cardiomegaly is likely unchanged allowing for differences in technique.  Mediastinal silhouette is stable.  Current examination has been performed with a similar degree of lung inflation. No frank pulmonary edema is identified.  No pleural effusion or airspace consolidation.  No pneumothorax.  Osseous structures are unchanged.  IMPRESSION: Stable cardiomegaly without frank pulmonary edema or airspace consolidation.   Original Report Authenticated By: Rise Mu, M.D.   Acute Abdominal Series  02/26/2013   *RADIOLOGY REPORT*  Clinical Data: Abdominal pain.  Nausea and vomiting.  ACUTE ABDOMEN SERIES (ABDOMEN 2 VIEW & CHEST 1 VIEW)  Comparison: Chest x-ray dated 02/25/2013 and abdominal radiographs dated 10/11/2012 and CT scan dated 02/20/2013  Findings: There is no free air or free fluid in the abdomen.  The bowel gas pattern is normal with only a small amount of air in the bowel.  There is hepatomegaly.  Chronic cardiomegaly.  Slight pulmonary vascular prominence.  IMPRESSION: No evidence of bowel obstruction or other acute abnormality in the  abdomen.  Hepatomegaly.  Chronic cardiomegaly.  Slight pulmonary vascular congestion.   Original Report Authenticated  By: Francene Boyers, M.D.    Rema Fendt, PA-S [place], [place] [facility] [phone]   If 7PM-7AM, please contact night-coverage [email or web address] Password [email or web address] 03/01/2013, 11:34 AM   LOS: 4 days   Attending Patient seen and examined, and we will do assessment and plan as outlined above. Some improvement, able to tolerate diet, however continues to have pain. I have reached up with Dr. Marina Goodell Shen-oncology surgeon at [facility], he will review the MRI  images that were sent to him via [employer] with his radiologist at this institution's and will call me back. Spoke with the hospitalist at [facility], they declined admission till surgery can determine whether or not they can help this patient at [facility]. In the meantime, minimize IV narcotics, start Vicodin, start scheduled Reglan. Attempts also have been made to see if we can get this patient in early appointment at [facility], please see prior note from Ms [place] PA-C.  S Ghimire

## 2013-03-01 NOTE — Progress Notes (Signed)
Agree with above 

## 2013-03-02 LAB — CBC
HCT: 26.4 % — ABNORMAL LOW (ref 36.0–46.0)
Hemoglobin: 8.7 g/dL — ABNORMAL LOW (ref 12.0–15.0)
MCHC: 33 g/dL (ref 30.0–36.0)
MCV: 82.5 fL (ref 78.0–100.0)

## 2013-03-02 LAB — GLUCOSE, CAPILLARY
Glucose-Capillary: 103 mg/dL — ABNORMAL HIGH (ref 70–99)
Glucose-Capillary: 103 mg/dL — ABNORMAL HIGH (ref 70–99)
Glucose-Capillary: 145 mg/dL — ABNORMAL HIGH (ref 70–99)

## 2013-03-02 NOTE — Progress Notes (Signed)
PATIENT DETAILS Name: [name] Age: 42 y.o. Sex: female Date of Birth: 12-Feb-1971 Admit Date: 02/25/2013 Admitting Physician Lynden Oxford, MD ZOX:[id number] C, MD  Subjective: Less pain today, tolerating full liquids. No vomiting since 7/30  Assessment/Plan: Principal Problem: Abdominal pain and vomiting  - Etiology not very certain-EGD basically negative. Not able to do a HIDA scan-patient's weight beyond the capability for HIDA scan, however doubt pain from gallbladder etiology. Not sure if the right upper quadrant pain is from mass effect from a known liver mass, or perhaps from gastroparesis  - Regarding the hepatic mass, we have been in touch with Dr. Deveron Furlong at [facility], he will look at the patient's MRI of the abdomen and see if their facility can offer any further input. Will also be in touch with Dr. Luna Fuse at [facility], they will see patient in outpatient appointment at [facility] this coming Tuesday.  - Thankfully, pain slowly resolving with conservative measures, continue full liquids, continue with scheduled IV Reglan.   Active Problems: Transaminitis  -trending down.  -Suspect this is from liver mass-and mass effect  -Avoid hepatotoxic medications   Hepatic Mass  -7.1 x 7.6 x 6.4 cm as noted on MRI Liver 7/27. Felt to be benign by radiologist.  - At this point it is not clear, whether this mass is causing her to have pain by stretching the liver capsule. Upon discussion with surgeons at tertiary facilities, did doubt that this is the cause. We are waiting to hear from Dr. Deveron Furlong at [facility], we will also reached out to Dr. Tracey Harries at [facility]. We are awaiting further word from tertiary care center.    Lactic Acidosis  -Secondary to vomiting  -Resolved with IVF   Hypertension  -Hydralazine IV PRN while vomiting.  -Continue Bidil, Cozaar, Coreg when able.   SLE / Sjogren's  -Continue Imuran, Plaquenil, Prednisone when able  -Follows with Dr. Lanell Matar at  [facility]   H/O Stroke  -Continue Plavix   Anxiety  -Xanax QHS  -Celexa, Wellbutrin, Trazodone  Disposition: Remain inpatient  DVT Prophylaxis: Prophylactic  Heparin   Code Status: Full code   Family Communication  none at bedside   Procedures:   EGD on 7/29.   CONSULTS:  GI and general surgery   MEDICATIONS: Scheduled Meds: . ALPRAZolam  0.5 mg Oral QHS  . aspirin EC  81 mg Oral Daily  . azaTHIOprine  50 mg Oral BID  . carvedilol  3.125 mg Oral BID WC  . citalopram  20 mg Oral QPM  . clopidogrel  75 mg Oral Q breakfast  . cycloSPORINE  1 drop Both Eyes TID  . gabapentin  300 mg Oral BID  . heparin  5,000 Units Subcutaneous Q8H  . hydroxychloroquine  200 mg Oral BID  . insulin aspart  0-9 Units Subcutaneous TID WC  . insulin aspart  3 Units Subcutaneous TID WC  . isosorbide-hydrALAZINE  1 tablet Oral TID  . losartan  50 mg Oral Daily  . metoCLOPramide (REGLAN) injection  10 mg Intravenous TID AC & HS  . pantoprazole  40 mg Oral BID  . polyethylene glycol  17 g Oral BID  . predniSONE  5 mg Oral Q breakfast  . senna  2 tablet Oral BID  . traZODone  100 mg Oral QHS   Continuous Infusions:  PRN Meds:.albuterol, dextrose, hydrALAZINE, HYDROcodone-acetaminophen, ketorolac, LORazepam, ondansetron (ZOFRAN) IV, ondansetron  Antibiotics: Anti-infectives   Start     Dose/Rate Route Frequency Ordered Stop  02/26/13 0130  hydroxychloroquine (PLAQUENIL) tablet 200 mg     200 mg Oral 2 times daily 02/26/13 0041         PHYSICAL EXAM: Vital signs in last 24 hours: Filed Vitals:   03/01/13 0609 03/01/13 1500 03/01/13 2127 03/02/13 0500  BP: 142/79 133/73 159/83 162/85  Pulse: 80 80 71 93  Temp: 99 F (37.2 C) 98.2 F (36.8 C) 98.1 F (36.7 C) 98 F (36.7 C)  TempSrc: Oral Oral Oral Oral  Resp: 18 18 18 18   Height:      Weight:    187 kg (412 lb 4.2 oz)  SpO2: 99% 100% 100% 99%    Weight change:  Filed Weights   02/25/13 1915 02/26/13 0107 03/02/13  0500  Weight: 177.356 kg (391 lb) 178.9 kg (394 lb 6.5 oz) 187 kg (412 lb 4.2 oz)   Body mass index is 62.7 kg/(m^2).   Gen Exam: Awake and alert with clear speech.   Neck: Supple, No JVD.   Chest: B/L Clear.   CVS: S1 S2 Regular, no murmurs.  Abdomen: soft, BS +, obese, minimal tenderness in the right upper quadrant region. non distended.  Extremities: no edema, lower extremities warm to touch. Neurologic: Non Focal.   Skin: No Rash.   Wounds: N/A.   Intake/Output from previous day:  Intake/Output Summary (Last 24 hours) at 03/02/13 1043 Last data filed at 03/01/13 1300  Gross per 24 hour  Intake    140 ml  Output      0 ml  Net    140 ml     LAB RESULTS: CBC  Recent Labs Lab 02/26/13 0430 02/27/13 0615 02/28/13 0515 03/01/13 0450 03/02/13 0810  WBC 9.2 11.8* 12.1* 10.2 9.9  HGB 9.5* 9.7* 8.5* 8.4* 8.7*  HCT 29.5* 30.1* 26.3* 25.7* 26.4*  PLT 257 247 249 248 299  MCV 83.1 83.8 84.0 84.3 82.5  MCH 26.8 27.0 27.2 27.5 27.2  MCHC 32.2 32.2 32.3 32.7 33.0  RDW 15.6* 15.6* 15.5 15.5 14.8    Chemistries   Recent Labs Lab 02/25/13 1918 02/26/13 0430 02/28/13 0515  NA 139 139 139  K 3.3* 3.8 3.9  CL 103 105 107  CO2 26 23 26   GLUCOSE 42* 102* 102*  BUN 8 7 6   CREATININE 0.96 1.04 0.99  CALCIUM 9.4 8.8 8.6    CBG:  Recent Labs Lab 03/01/13 1739 03/01/13 2004 03/02/13 0004 03/02/13 0436 03/02/13 0746  GLUCAP 154* 126* 103* 90 103*    GFR Estimated Creatinine Clearance: 132.2 ml/min (by C-G formula based on Cr of 0.99).  Coagulation profile  Recent Labs Lab 02/26/13 0430 02/28/13 0515  INR 1.05 1.10    Cardiac Enzymes  Recent Labs Lab 02/26/13 0843  TROPONINI <0.30    No components found with this basename: POCBNP,  No results found for this basename: DDIMER,  in the last 72 hours No results found for this basename: HGBA1C,  in the last 72 hours No results found for this basename: CHOL, HDL, LDLCALC, TRIG, CHOLHDL, LDLDIRECT,  in  the last 72 hours No results found for this basename: TSH, T4TOTAL, FREET3, T3FREE, THYROIDAB,  in the last 72 hours No results found for this basename: VITAMINB12, FOLATE, FERRITIN, TIBC, IRON, RETICCTPCT,  in the last 72 hours No results found for this basename: LIPASE, AMYLASE,  in the last 72 hours  Urine Studies No results found for this basename: UACOL, UAPR, USPG, UPH, UTP, UGL, UKET, UBIL, UHGB, UNIT, UROB, ULEU,  UEPI, UWBC, URBC, UBAC, CAST, CRYS, UCOM, BILUA,  in the last 72 hours  MICROBIOLOGY: Recent Results (from the past 240 hour(s))  URINE CULTURE     Status: None   Collection Time    02/20/13  8:01 PM      Result Value Range Status   Specimen Description URINE, RANDOM   Final   Special Requests NONE   Final   Culture  Setup Time 02/21/2013 02:32   Final   Colony Count NO GROWTH   Final   Culture NO GROWTH   Final   Report Status 02/21/2013 FINAL   Final    RADIOLOGY STUDIES/RESULTS: Ct Abdomen Pelvis W Wo Contrast  02/20/2013   *RADIOLOGY REPORT*  Clinical Data: Chest pain and upper abdominal pain with nausea and vomiting.  Shortness of breath.  CT ABDOMEN AND PELVIS WITHOUT AND WITH CONTRAST  Technique:  Multidetector CT imaging of the abdomen and pelvis was performed without contrast material in one or both body regions, followed by contrast material(s) and further sections in one or both body regions.  Contrast: OMNIPAQUE IOHEXOL 300 MG/ML  SOLN  Comparison: Ultrasound abdomen 02/20/2013.  Findings: Lung bases show scattered subsegmental atelectasis. Heart is enlarged.  No pericardial or pleural effusion.  Although the exam was intended to have arterial and portal venous phase imaging, an IV tubing malfunction occurred during contrast injection and the postcontrast images are both in the delayed nephrographic phase.  There is a heterogeneous mass in the right hepatic lobe, measuring approximately 5.9 x 7.7 cm in greatest transaxial dimension.  It bulges the liver  contour slightly.  Liver measures approximately 22.6 cm.  Gallbladder, adrenal glands, kidneys, spleen, pancreas, stomach and bowel are unremarkable.  Uterus and ovaries are visualized.  No pathologically enlarged lymph nodes.  No worrisome lytic or sclerotic lesions.  IMPRESSION:  1.  Right hepatic lobe mass is poorly characterized on today's exam, for reasons given above.  Differential diagnosis is broad and includes abscess, hemangioma and neoplasm.  MR abdomen without and with contrast (preferably with Eovist) is recommended. 2.  Hepatomegaly.   Original Report Authenticated By: Leanna Battles, M.D.   Dg Chest 2 View  02/25/2013   *RADIOLOGY REPORT*  Clinical Data: Shortness of breath  CHEST - 2 VIEW  Comparison: February 20, 2013  Findings: Lungs clear.  Heart is borderline enlarged with normal pulmonary vascularity.  No adenopathy.  No bone lesions.  IMPRESSION: No edema or consolidation.  Heart borderline enlarged.   Original Report Authenticated By: Bretta Bang, M.D.   Dg Chest 2 View  02/18/2013   *RADIOLOGY REPORT*  Clinical Data: Shortness of breath  CHEST - 2 VIEW  Comparison:  October 09, 2012  Findings:  Lungs are clear.  Heart is mildly enlarged with normal pulmonary vascularity.  No adenopathy.  No bone lesions.  IMPRESSION: No edema or consolidation.  Stable mild cardiac enlargement.   Original Report Authenticated By: Bretta Bang, M.D.   Mr Abdomen W Wo Contrast  02/25/2013   **ADDENDUM** CREATED: 02/25/2013 09:05:20  These findings and recommendations were called by telephone on 02/25/2013 at 09:00 AM to PA [facility], who verbally acknowledged these results.  **END ADDENDUM** SIGNED BY: Florencia Reasons, M.D.  02/25/2013   *RADIOLOGY REPORT*  Clinical Data: Follow-up evaluation of liver lesions seen on CT examination.  MRI ABDOMEN WITH AND WITHOUT CONTRAST  Technique:  Multiplanar multisequence MR imaging of the abdomen was performed both before and after administration of  intravenous contrast.  Contrast:  10 ml Eovist  Comparison: CT of the abdomen and pelvis 02/20/2013.  Comment:  Study is limited by considerable patient motion, particularly on the postcontrast images, which affects both have their quality, as well as the fidelity of the subtraction images.  Findings: Within the central aspect of the liver, predominantly involving segments 4B and 5, there is a 7.1 x 7.6 x 6.4 cm lesion and is heterogeneous in signal intensity on pregadolinium T1 and T2- weighted images.  The periphery of the lesion is predominantly T1 hyperintense and T2 hypointense, while the central aspect of the lesion is heterogeneously T1 hypointense and generally T2 hyperintense.  Post gadolinium imaging is limited by considerable patient motion which limits accurate assessment of internal enhancement.  There does appear to be some heterogeneous internal enhancement which is predominately peripheral and nodular, with some progressive enhancement centrally.  Delayed post gadolinium images obtained at 20 minutes demonstrates accumulation of the contrast within the liver parenchyma around the lesion, but there is relative paucity of contrast within the lesion, indicative of a lesion of non hepatocyte origin.  Within the visualized portions of the abdomen, the appearance of the gallbladder, pancreas, spleen, bilateral adrenal glands and bilateral kidneys is unremarkable.  IMPRESSION: 1.  7.1 x 7.6 x 6.4 cm lesion in segment 4B and 5 of the liver has unusual imaging characteristics, detailed above.  Given the lack of accumulation of contrast agent on delayed phase imaging with Eovist, which indicates a lesion not of hepatocellular origin, differential considerations primarily include an atypical giant cavernous hemangioma, atypical adenoma with central necrosis, or less likely a large solitary metastasis with central necrosis. Lack of enhancement on the delayed images with Eovist indicates that this is not a large  focal nodular hyperplasia (FNH).  At this time, a short term follow-up MRI with Multihance (not Eovist) in 2- 3 months is recommended to assess for size stability, and to better evaluate the dynamic post-contrast enhancement characteristics of this lesion.  Original Report Authenticated By: Trudie Reed, M.D.   US Abdomen Complete  02/20/2013   *RADIOLOGY REPORT*  Abdominal ultrasound  History:  Abdominal pain  Findings:  Gallbladder is visualized in multiple projections. There are no gallstones, gallbladder wall thickening, or pericholecystic fluid collection.  There is no intrahepatic, common hepatic, or common bile duct dilatation.  Pancreas appears normal.  In the left lobe of the liver, there is a mass-like area which is slightly hyperechoic compared the remainder of the liver measuring 7.6 x 5.2 x 7.8 cm in size.  No other focal liver lesions are identified.  Spleen is normal in size and homogeneous in echotexture.  Kidneys bilaterally appear within normal limits.  There is no ascites. Aorta is nonaneurysmal.  Inferior vena cava appears patent.  Conclusion:  Mass-like area in the left lobe of the liver.  This finding warrants further evaluation.  MRI with and without intravenous contrast would be the imaging study of choice to further assess.  Study otherwise unremarkable.   Original Report Authenticated By: Bretta Bang, M.D.   Dg Chest Portable 1 View  02/20/2013   *RADIOLOGY REPORT*  Clinical Data: Chest pain, shortness of breath  PORTABLE CHEST - 1 VIEW  Comparison: Prior radiograph from 02/18/2013  Findings: Cardiomegaly is likely unchanged allowing for differences in technique.  Mediastinal silhouette is stable.  Current examination has been performed with a similar degree of lung inflation. No frank pulmonary edema is identified.  No pleural effusion or airspace consolidation.  No pneumothorax.  Osseous structures are unchanged.  IMPRESSION: Stable cardiomegaly without frank pulmonary edema  or airspace consolidation.   Original Report Authenticated By: Rise Mu, M.D.   Acute Abdominal Series  02/26/2013   *RADIOLOGY REPORT*  Clinical Data: Abdominal pain.  Nausea and vomiting.  ACUTE ABDOMEN SERIES (ABDOMEN 2 VIEW & CHEST 1 VIEW)  Comparison: Chest x-ray dated 02/25/2013 and abdominal radiographs dated 10/11/2012 and CT scan dated 02/20/2013  Findings: There is no free air or free fluid in the abdomen.  The bowel gas pattern is normal with only a small amount of air in the bowel.  There is hepatomegaly.  Chronic cardiomegaly.  Slight pulmonary vascular prominence.  IMPRESSION: No evidence of bowel obstruction or other acute abnormality in the abdomen.  Hepatomegaly.  Chronic cardiomegaly.  Slight pulmonary vascular congestion.   Original Report Authenticated By: Francene Boyers, M.D.    Jeoffrey Massed, MD  [facility] Pager:[phone]  If 7PM-7AM, please contact night-coverage [email or web address] Password [facility] 03/02/2013, 10:43 AM   LOS: 5 days

## 2013-03-03 LAB — GLUCOSE, CAPILLARY
Glucose-Capillary: 106 mg/dL — ABNORMAL HIGH (ref 70–99)
Glucose-Capillary: 110 mg/dL — ABNORMAL HIGH (ref 70–99)

## 2013-03-03 LAB — COMPREHENSIVE METABOLIC PANEL
ALT: 50 U/L — ABNORMAL HIGH (ref 0–35)
AST: 9 U/L (ref 0–37)
Calcium: 9 mg/dL (ref 8.4–10.5)
Creatinine, Ser: 0.98 mg/dL (ref 0.50–1.10)
GFR calc Af Amer: 81 mL/min — ABNORMAL LOW (ref >90)
Glucose, Bld: 99 mg/dL (ref 70–99)
Sodium: 139 mEq/L (ref 135–145)
Total Protein: 6 g/dL (ref 6.0–8.3)

## 2013-03-03 MED ORDER — METOCLOPRAMIDE HCL 10 MG PO TABS
10.0000 mg | ORAL_TABLET | Freq: Three times a day (TID) | ORAL | Status: DC
  Administered 2013-03-03 – 2013-03-04 (×5): 10 mg via ORAL
  Filled 2013-03-03 (×8): qty 1

## 2013-03-03 NOTE — Progress Notes (Signed)
PATIENT DETAILS Name: [name] Age: 42 y.o. Sex: female Date of Birth: 1971-07-14 Admit Date: 02/25/2013 Admitting Physician Lynden Oxford, MD AVW:[id number],YNWGNFAO C, MD  Subjective: Continues to improve, tolerating a dysphagia 3 diet. No vomiting. Minimal pain today  Assessment/Plan: Principal Problem: Abdominal pain and vomiting  - Etiology not very certain-EGD basically negative. Not able to do a HIDA scan-patient's weight beyond the capability for HIDA scan, however doubt pain from gallbladder etiology. Not sure if the right upper quadrant pain is from mass effect from a known liver mass, or perhaps from gastroparesis . Patient was seen by gastroenterology, and endoscopy done on 7/29 did not reveal any major abnormalities. Patient was empirically started on IV Reglan, PPI, was given as needed narcotics for pain. With these measures, patient has significantly improved. We will convert IV Reglan to oral, and educate the patient regarding small portion meals. - Regarding the hepatic mass, we have been in touch with Dr. Deveron Furlong at [facility], he will look at the patient's MRI of the abdomen and see if their facility can offer any further input. Will also be in touch with Dr. Luna Fuse at [facility], they will see patient in outpatient appointment at [facility] this coming Tuesday.  - GI and general surgery did follow the patient during her hospital stay here.  Active Problems: Transaminitis  -trending down and have almost normalized. -Suspect this is from liver mass-and mass effect  -Avoid hepatotoxic medications   Hepatic Mass  -7.1 x 7.6 x 6.4 cm as noted on MRI Liver 7/27. Felt to be benign by radiologist.  - At this point it is not clear, whether this mass is causing her to have pain by stretching the liver capsule. Upon discussion with surgeons at tertiary facilities, did doubt that this is the cause. We are waiting to hear from Dr. Deveron Furlong at [facility], we will also reached out to  Dr. Tracey Harries at [facility]. We are awaiting further word from tertiary care center.    Lactic Acidosis  -Secondary to vomiting  -Resolved with IVF   Hypertension  - Moderate control.  -Continue Bidil, Cozaar, we'll increase Coreg to 6.25 mg twice a day   SLE / Sjogren's  -Continue Imuran, Plaquenil, Prednisone when able  -Follows with Dr. Lanell Matar at [facility]   H/O Stroke  -Continue Plavix   Anxiety  -Xanax QHS  -Celexa, Wellbutrin, Trazodone  Disposition: Remain inpatient- if clinical improvement continues, home in the next 24 hours  DVT Prophylaxis: Prophylactic Heparin   Code Status: Full code   Family Communication  none at bedside   Procedures:   EGD on 7/29.   CONSULTS:  GI and general surgery   MEDICATIONS: Scheduled Meds: . ALPRAZolam  0.5 mg Oral QHS  . aspirin EC  81 mg Oral Daily  . azaTHIOprine  50 mg Oral BID  . carvedilol  3.125 mg Oral BID WC  . citalopram  20 mg Oral QPM  . clopidogrel  75 mg Oral Q breakfast  . cycloSPORINE  1 drop Both Eyes TID  . gabapentin  300 mg Oral BID  . heparin  5,000 Units Subcutaneous Q8H  . hydroxychloroquine  200 mg Oral BID  . insulin aspart  0-9 Units Subcutaneous TID WC  . insulin aspart  3 Units Subcutaneous TID WC  . isosorbide-hydrALAZINE  1 tablet Oral TID  . losartan  50 mg Oral Daily  . metoCLOPramide  10 mg Oral TID AC & HS  . pantoprazole  40 mg Oral  BID  . polyethylene glycol  17 g Oral BID  . predniSONE  5 mg Oral Q breakfast  . senna  2 tablet Oral BID  . traZODone  100 mg Oral QHS   Continuous Infusions:  PRN Meds:.albuterol, dextrose, hydrALAZINE, HYDROcodone-acetaminophen, ketorolac, LORazepam, ondansetron (ZOFRAN) IV, ondansetron  Antibiotics: Anti-infectives   Start     Dose/Rate Route Frequency Ordered Stop   02/26/13 0130  hydroxychloroquine (PLAQUENIL) tablet 200 mg     200 mg Oral 2 times daily 02/26/13 0041         PHYSICAL EXAM: Vital signs in last 24 hours: Filed Vitals:    03/02/13 0500 03/02/13 1429 03/02/13 2108 03/03/13 0442  BP: 162/85 132/69 152/88 152/83  Pulse: 93 87 86 83  Temp: 98 F (36.7 C) 98.6 F (37 C) 98.7 F (37.1 C) 98.6 F (37 C)  TempSrc: Oral Oral Oral Oral  Resp: 18 18 18 18   Height:      Weight: 187 kg (412 lb 4.2 oz)   186.7 kg (411 lb 9.6 oz)  SpO2: 99% 99% 96% 99%    Weight change: -0.3 kg (-10.6 oz) Filed Weights   02/26/13 0107 03/02/13 0500 03/03/13 0442  Weight: 178.9 kg (394 lb 6.5 oz) 187 kg (412 lb 4.2 oz) 186.7 kg (411 lb 9.6 oz)   Body mass index is 62.6 kg/(m^2).   Gen Exam: Awake and alert with clear speech.   Neck: Supple, No JVD.   Chest: B/L Clear.   CVS: S1 S2 Regular, no murmurs.  Abdomen: soft, BS +, obese, very minimal tenderness in the right upper quadrant region. non distended.  Extremities: no edema, lower extremities warm to touch. Neurologic: Non Focal.   Skin: No Rash.   Wounds: N/A.   Intake/Output from previous day: No intake or output data in the 24 hours ending 03/03/13 1045   LAB RESULTS: CBC  Recent Labs Lab 02/26/13 0430 02/27/13 0615 02/28/13 0515 03/01/13 0450 03/02/13 0810  WBC 9.2 11.8* 12.1* 10.2 9.9  HGB 9.5* 9.7* 8.5* 8.4* 8.7*  HCT 29.5* 30.1* 26.3* 25.7* 26.4*  PLT 257 247 249 248 299  MCV 83.1 83.8 84.0 84.3 82.5  MCH 26.8 27.0 27.2 27.5 27.2  MCHC 32.2 32.2 32.3 32.7 33.0  RDW 15.6* 15.6* 15.5 15.5 14.8    Chemistries   Recent Labs Lab 02/25/13 1918 02/26/13 0430 02/28/13 0515 03/03/13 0600  NA 139 139 139 139  K 3.3* 3.8 3.9 3.8  CL 103 105 107 103  CO2 26 23 26 28   GLUCOSE 42* 102* 102* 99  BUN 8 7 6  5*  CREATININE 0.96 1.04 0.99 0.98  CALCIUM 9.4 8.8 8.6 9.0    CBG:  Recent Labs Lab 03/02/13 0746 03/02/13 1212 03/02/13 1649 03/02/13 2107 03/03/13 0759  GLUCAP 103* 145* 139* 141* 106*    GFR Estimated Creatinine Clearance: 133.4 ml/min (by C-G formula based on Cr of 0.98).  Coagulation profile  Recent Labs Lab 02/26/13 0430  02/28/13 0515  INR 1.05 1.10    Cardiac Enzymes  Recent Labs Lab 02/26/13 0843  TROPONINI <0.30    No components found with this basename: POCBNP,  No results found for this basename: DDIMER,  in the last 72 hours No results found for this basename: HGBA1C,  in the last 72 hours No results found for this basename: CHOL, HDL, LDLCALC, TRIG, CHOLHDL, LDLDIRECT,  in the last 72 hours No results found for this basename: TSH, T4TOTAL, FREET3, T3FREE, THYROIDAB,  in the  last 72 hours No results found for this basename: VITAMINB12, FOLATE, FERRITIN, TIBC, IRON, RETICCTPCT,  in the last 72 hours No results found for this basename: LIPASE, AMYLASE,  in the last 72 hours  Urine Studies No results found for this basename: UACOL, UAPR, USPG, UPH, UTP, UGL, UKET, UBIL, UHGB, UNIT, UROB, ULEU, UEPI, UWBC, URBC, UBAC, CAST, CRYS, UCOM, BILUA,  in the last 72 hours  MICROBIOLOGY: No results found for this or any previous visit (from the past 240 hour(s)).  RADIOLOGY STUDIES/RESULTS: Ct Abdomen Pelvis W Wo Contrast  02/20/2013   *RADIOLOGY REPORT*  Clinical Data: Chest pain and upper abdominal pain with nausea and vomiting.  Shortness of breath.  CT ABDOMEN AND PELVIS WITHOUT AND WITH CONTRAST  Technique:  Multidetector CT imaging of the abdomen and pelvis was performed without contrast material in one or both body regions, followed by contrast material(s) and further sections in one or both body regions.  Contrast: OMNIPAQUE IOHEXOL 300 MG/ML  SOLN  Comparison: Ultrasound abdomen 02/20/2013.  Findings: Lung bases show scattered subsegmental atelectasis. Heart is enlarged.  No pericardial or pleural effusion.  Although the exam was intended to have arterial and portal venous phase imaging, an IV tubing malfunction occurred during contrast injection and the postcontrast images are both in the delayed nephrographic phase.  There is a heterogeneous mass in the right hepatic lobe, measuring  approximately 5.9 x 7.7 cm in greatest transaxial dimension.  It bulges the liver contour slightly.  Liver measures approximately 22.6 cm.  Gallbladder, adrenal glands, kidneys, spleen, pancreas, stomach and bowel are unremarkable.  Uterus and ovaries are visualized.  No pathologically enlarged lymph nodes.  No worrisome lytic or sclerotic lesions.  IMPRESSION:  1.  Right hepatic lobe mass is poorly characterized on today's exam, for reasons given above.  Differential diagnosis is broad and includes abscess, hemangioma and neoplasm.  MR abdomen without and with contrast (preferably with Eovist) is recommended. 2.  Hepatomegaly.   Original Report Authenticated By: Leanna Battles, M.D.   Dg Chest 2 View  02/25/2013   *RADIOLOGY REPORT*  Clinical Data: Shortness of breath  CHEST - 2 VIEW  Comparison: February 20, 2013  Findings: Lungs clear.  Heart is borderline enlarged with normal pulmonary vascularity.  No adenopathy.  No bone lesions.  IMPRESSION: No edema or consolidation.  Heart borderline enlarged.   Original Report Authenticated By: Bretta Bang, M.D.   Dg Chest 2 View  02/18/2013   *RADIOLOGY REPORT*  Clinical Data: Shortness of breath  CHEST - 2 VIEW  Comparison:  October 09, 2012  Findings:  Lungs are clear.  Heart is mildly enlarged with normal pulmonary vascularity.  No adenopathy.  No bone lesions.  IMPRESSION: No edema or consolidation.  Stable mild cardiac enlargement.   Original Report Authenticated By: Bretta Bang, M.D.   Mr Abdomen W Wo Contrast  02/25/2013   **ADDENDUM** CREATED: 02/25/2013 09:05:20  These findings and recommendations were called by telephone on 02/25/2013 at 09:00 AM to [facility], who verbally acknowledged these results.  **END ADDENDUM** SIGNED BY: Florencia Reasons, M.D.  02/25/2013   *RADIOLOGY REPORT*  Clinical Data: Follow-up evaluation of liver lesions seen on CT examination.  MRI ABDOMEN WITH AND WITHOUT CONTRAST  Technique:  Multiplanar multisequence MR  imaging of the abdomen was performed both before and after administration of intravenous contrast.  Contrast:  10 ml Eovist  Comparison: CT of the abdomen and pelvis 02/20/2013.  Comment:  Study is limited by considerable  patient motion, particularly on the postcontrast images, which affects both have their quality, as well as the fidelity of the subtraction images.  Findings: Within the central aspect of the liver, predominantly involving segments 4B and 5, there is a 7.1 x 7.6 x 6.4 cm lesion and is heterogeneous in signal intensity on pregadolinium T1 and T2- weighted images.  The periphery of the lesion is predominantly T1 hyperintense and T2 hypointense, while the central aspect of the lesion is heterogeneously T1 hypointense and generally T2 hyperintense.  Post gadolinium imaging is limited by considerable patient motion which limits accurate assessment of internal enhancement.  There does appear to be some heterogeneous internal enhancement which is predominately peripheral and nodular, with some progressive enhancement centrally.  Delayed post gadolinium images obtained at 20 minutes demonstrates accumulation of the contrast within the liver parenchyma around the lesion, but there is relative paucity of contrast within the lesion, indicative of a lesion of non hepatocyte origin.  Within the visualized portions of the abdomen, the appearance of the gallbladder, pancreas, spleen, bilateral adrenal glands and bilateral kidneys is unremarkable.  IMPRESSION: 1.  7.1 x 7.6 x 6.4 cm lesion in segment 4B and 5 of the liver has unusual imaging characteristics, detailed above.  Given the lack of accumulation of contrast agent on delayed phase imaging with Eovist, which indicates a lesion not of hepatocellular origin, differential considerations primarily include an atypical giant cavernous hemangioma, atypical adenoma with central necrosis, or less likely a large solitary metastasis with central necrosis. Lack of  enhancement on the delayed images with Eovist indicates that this is not a large focal nodular hyperplasia (FNH).  At this time, a short term follow-up MRI with Multihance (not Eovist) in 2- 3 months is recommended to assess for size stability, and to better evaluate the dynamic post-contrast enhancement characteristics of this lesion.  Original Report Authenticated By: Trudie Reed, M.D.   US Abdomen Complete  02/20/2013   *RADIOLOGY REPORT*  Abdominal ultrasound  History:  Abdominal pain  Findings:  Gallbladder is visualized in multiple projections. There are no gallstones, gallbladder wall thickening, or pericholecystic fluid collection.  There is no intrahepatic, common hepatic, or common bile duct dilatation.  Pancreas appears normal.  In the left lobe of the liver, there is a mass-like area which is slightly hyperechoic compared the remainder of the liver measuring 7.6 x 5.2 x 7.8 cm in size.  No other focal liver lesions are identified.  Spleen is normal in size and homogeneous in echotexture.  Kidneys bilaterally appear within normal limits.  There is no ascites. Aorta is nonaneurysmal.  Inferior vena cava appears patent.  Conclusion:  Mass-like area in the left lobe of the liver.  This finding warrants further evaluation.  MRI with and without intravenous contrast would be the imaging study of choice to further assess.  Study otherwise unremarkable.   Original Report Authenticated By: Bretta Bang, M.D.   Dg Chest Portable 1 View  02/20/2013   *RADIOLOGY REPORT*  Clinical Data: Chest pain, shortness of breath  PORTABLE CHEST - 1 VIEW  Comparison: Prior radiograph from 02/18/2013  Findings: Cardiomegaly is likely unchanged allowing for differences in technique.  Mediastinal silhouette is stable.  Current examination has been performed with a similar degree of lung inflation. No frank pulmonary edema is identified.  No pleural effusion or airspace consolidation.  No pneumothorax.  Osseous  structures are unchanged.  IMPRESSION: Stable cardiomegaly without frank pulmonary edema or airspace consolidation.   Original Report Authenticated By:  Rise Mu, M.D.   Acute Abdominal Series  02/26/2013   *RADIOLOGY REPORT*  Clinical Data: Abdominal pain.  Nausea and vomiting.  ACUTE ABDOMEN SERIES (ABDOMEN 2 VIEW & CHEST 1 VIEW)  Comparison: Chest x-ray dated 02/25/2013 and abdominal radiographs dated 10/11/2012 and CT scan dated 02/20/2013  Findings: There is no free air or free fluid in the abdomen.  The bowel gas pattern is normal with only a small amount of air in the bowel.  There is hepatomegaly.  Chronic cardiomegaly.  Slight pulmonary vascular prominence.  IMPRESSION: No evidence of bowel obstruction or other acute abnormality in the abdomen.  Hepatomegaly.  Chronic cardiomegaly.  Slight pulmonary vascular congestion.   Original Report Authenticated By: Francene Boyers, M.D.    Jeoffrey Massed, MD  [facility] Pager:[phone]  If 7PM-7AM, please contact night-coverage [email or web address] Password [email or web address] 03/03/2013, 10:45 AM   LOS: 6 days

## 2013-03-04 DIAGNOSIS — K3184 Gastroparesis: Secondary | ICD-10-CM | POA: Diagnosis present

## 2013-03-04 LAB — GLUCOSE, CAPILLARY: Glucose-Capillary: 141 mg/dL — ABNORMAL HIGH (ref 70–99)

## 2013-03-04 MED ORDER — METOCLOPRAMIDE HCL 10 MG PO TABS: 10.0000 mg | ORAL_TABLET | Freq: Three times a day (TID) | ORAL | Status: DC

## 2013-03-04 MED ORDER — INSULIN ASPART 100 UNIT/ML ~~LOC~~ SOLN: 3.0000 [IU] | Freq: Three times a day (TID) | SUBCUTANEOUS | Status: DC

## 2013-03-04 MED ORDER — POTASSIUM CHLORIDE ER 10 MEQ PO TBCR: 10.0000 meq | EXTENDED_RELEASE_TABLET | Freq: Every day | ORAL | Status: DC

## 2013-03-04 MED ORDER — TORSEMIDE 20 MG PO TABS: 20.0000 mg | ORAL_TABLET | Freq: Every day | ORAL | Status: DC

## 2013-03-04 MED ORDER — INSULIN ASPART 100 UNIT/ML ~~LOC~~ SOLN: 0.0000 [IU] | Freq: Three times a day (TID) | SUBCUTANEOUS | Status: DC

## 2013-03-04 MED ORDER — INSULIN ASPART 100 UNIT/ML FLEXPEN: 3.0000 [IU] | PEN_INJECTOR | Freq: Three times a day (TID) | SUBCUTANEOUS | Status: DC

## 2013-03-04 NOTE — Progress Notes (Signed)
NURSING PROGRESS NOTE  [name] Risk [id number] Discharge Data: 03/04/2013 11:20 AM Attending Provider: Maretta Bees, MD WUJ:[id number],WGNFAOZH C, MD     Karle Barr to be D/C'd Home per MD order.  Discussed with the patient the After Visit Summary and all questions fully answered. All IV's discontinued with no bleeding noted. All belongings returned to patient for patient to take home.   Last Vital Signs:  Blood pressure 158/89, pulse 87, temperature 99.2 F (37.3 C), temperature source Oral, resp. rate 20, height 5\' 8"  (1.727 m), weight 181.4 kg (399 lb 14.6 oz), last menstrual period 02/25/2013, SpO2 97.00%.  Discharge Medication List   Medication List    STOP taking these medications       acetaminophen 500 MG tablet  Commonly known as:  TYLENOL     insulin regular human CONCENTRATED 500 UNIT/ML Soln injection  Commonly known as:  HUMULIN R     potassium chloride SA 20 MEQ tablet  Commonly known as:  K-DUR,KLOR-CON      TAKE these medications       alendronate 70 MG tablet  Commonly known as:  FOSAMAX  Take 70 mg by mouth every 7 (seven) days. thursdays. Take with a full glass of water on an empty stomach.     ALPRAZolam 0.5 MG tablet  Commonly known as:  XANAX  Take 1 tablet (0.5 mg total) by mouth 3 (three) times daily as needed for sleep.     aspirin EC 81 MG tablet  Take 81 mg by mouth daily.     azaTHIOprine 50 MG tablet  Commonly known as:  IMURAN  Take 50 mg by mouth 2 (two) times daily.     buPROPion 150 MG 12 hr tablet  Commonly known as:  WELLBUTRIN SR  Take 150 mg by mouth 2 (two) times daily.     cetirizine 10 MG tablet  Commonly known as:  ZYRTEC  Take 10 mg by mouth daily.     citalopram 20 MG tablet  Commonly known as:  CELEXA  Take 20 mg by mouth every evening.     clopidogrel 75 MG tablet  Commonly known as:  PLAVIX  Take 75 mg by mouth daily.     COREG 3.125 MG tablet  Generic drug:  carvedilol  Take 3.125 mg by mouth 2 (two) times  daily with a meal.     cycloSPORINE 0.05 % ophthalmic emulsion  Commonly known as:  RESTASIS  Place 1 drop into both eyes 3 (three) times daily.     famotidine 20 MG tablet  Commonly known as:  PEPCID  Take 20 mg by mouth 2 (two) times daily.     gabapentin 300 MG capsule  Commonly known as:  NEURONTIN  Take 300 mg by mouth 2 (two) times daily.     hydroxychloroquine 200 MG tablet  Commonly known as:  PLAQUENIL  Take 200 mg by mouth 2 (two) times daily.     insulin aspart 100 UNIT/ML Sopn FlexPen  Commonly known as:  NOVOLOG FLEXPEN  - Inject 3 Units into the skin 3 (three) times daily with meals. In addition to 3 units with each meal add the following sliding scale coverage:  -  121-150 = 1 unit  -  151 - 200 = 2 units  -  201 - 250 = 3 units  -  251 - 300 = 5 units  -  301 - 350 = 7 units  -  351 - 400 = 9  units     isosorbide-hydrALAZINE 20-37.5 MG per tablet  Commonly known as:  BIDIL  Take 1 tablet by mouth 3 (three) times daily.     losartan 100 MG tablet  Commonly known as:  COZAAR  Take 50 mg by mouth daily.     metoCLOPramide 10 MG tablet  Commonly known as:  REGLAN  Take 1 tablet (10 mg total) by mouth 4 (four) times daily -  before meals and at bedtime.     ondansetron 4 MG tablet  Commonly known as:  ZOFRAN  Take 1 tablet (4 mg total) by mouth every 6 (six) hours as needed for nausea.     pantoprazole 40 MG tablet  Commonly known as:  PROTONIX  Take 1 tablet (40 mg total) by mouth 2 (two) times daily.     polyethylene glycol packet  Commonly known as:  MIRALAX / GLYCOLAX  Take 17 g by mouth 2 (two) times daily.     potassium chloride 10 MEQ tablet  Commonly known as:  K-DUR  Take 1 tablet (10 mEq total) by mouth daily.     pravastatin 10 MG tablet  Commonly known as:  PRAVACHOL  Take 10 mg by mouth at bedtime.     predniSONE 5 MG tablet  Commonly known as:  DELTASONE  Take 1 tablet (5 mg total) by mouth daily with breakfast.     PROAIR  HFA 108 (90 BASE) MCG/ACT inhaler  Generic drug:  albuterol  Inhale 2 puffs into the lungs every 6 (six) hours as needed. For shortness of breath/wheeze     senna 8.6 MG Tabs tablet  Commonly known as:  SENOKOT  Take 2 tablets (17.2 mg total) by mouth 2 (two) times daily.     torsemide 20 MG tablet  Commonly known as:  DEMADEX  Take 1 tablet (20 mg total) by mouth daily.     traZODone 100 MG tablet  Commonly known as:  DESYREL  Take 100 mg by mouth at bedtime.

## 2013-03-04 NOTE — Progress Notes (Signed)
Inpatient Diabetes Program Recommendations  AACE/ADA: New Consensus Statement on Inpatient Glycemic Control (2013)  Target Ranges:  Prepandial:   less than 140 mg/dL      Peak postprandial:   less than 180 mg/dL (1-2 hours)      Critically ill patients:  140 - 180 mg/dL   Reason for Visit: consult  Patient had concerns about her insulin regimen at home.  She has been taking U-500 TID at home but has not required basal insulin during hospitalization.  She has been on minimal Novolog correction scale over the course of her stay. It is not recommended that she continue U-500 at this time.  She reports frequent hypoglycemia in the morning prior to admission.  She will be discharged home with a sensitive correction scale to use Novolog TID ac.  We discussed monitoring CBGs and if her glucose starts to rise she should contact Dr. Everardo All to discuss her insulin regimen.  Patient did not have any further questions/concerns at this time. Thank you  Piedad Climes BSN, RN,CDE Inpatient Diabetes Coordinator [phone] (team pager)

## 2013-03-11 ENCOUNTER — Ambulatory Visit (INDEPENDENT_AMBULATORY_CARE_PROVIDER_SITE_OTHER): Payer: Medicaid Other | Admitting: Endocrinology

## 2013-03-11 ENCOUNTER — Encounter: Payer: Self-pay | Admitting: Endocrinology

## 2013-03-11 VITALS — BP 122/74 | HR 80 | Ht 68.0 in | Wt 383.0 lb

## 2013-03-11 DIAGNOSIS — E109 Type 1 diabetes mellitus without complications: Secondary | ICD-10-CM

## 2013-03-11 NOTE — Patient Instructions (Addendum)
Please continue the same insulin. If your weight loss continues, you can stop the insulin altogether.  However, your insulin requirement may increase again, so please keep careful track of your blood sugar  check your blood sugar 2 times a day.  vary the time of day when you check, between before the 3 meals, and at bedtime.  also check if you have symptoms of your blood sugar being too high or too low.  please keep a record of the readings and bring it to your next appointment here.  please call us sooner if you are having low blood sugar episodes, or if it stays over 200.   Please come back for a follow-up appointment in 1 month.

## 2013-03-11 NOTE — Progress Notes (Signed)
Subjective:    Patient ID: [name], female    DOB: 12/10/1970, 42 y.o.   MRN: [id number]  Diabetes   Pt returns for f/u of insulin-requiring DM (dx'ed 2011; she has mild if any neuropathy of the lower extremities; she has no known associated complications; she has never had severe hypoglycemia or DKA).  She is still on prednisone, 4 mg qd.  She has lost weight, and her insulin requirement has decreased.  She now takes only novolog, a total of approx 6 units per day.   Past Medical History  Diagnosis Date  . IDDM 08/09/2010  . Bell's palsy 08/09/2010  . SLE 08/09/2010  . SJOGREN'S SYNDROME 08/09/2010  . Hypertension   . High cholesterol   . CHF (congestive heart failure)   . Anginal pain   . Shortness of breath     "all the time"  . Obstructive sleep apnea on CPAP 2011  . Migraines   . Stroke 2010    "left side is still weak from it; never fully regained full strength"  . Anxiety   . GERD (gastroesophageal reflux disease)     Past Surgical History  Procedure Laterality Date  . Ectopic pregnancy surgery  1999  . Cardiac catheterization  02/07/12  . Hernia repair  1980's    umbilical  . Epidermoid cyst excision  2007     left breast  . Esophagogastroduodenoscopy N/A 02/26/2013    Procedure: ESOPHAGOGASTRODUODENOSCOPY (EGD);  Surgeon: Hilarie Fredrickson, MD;  Location: [facility] ENDOSCOPY;  Service: Endoscopy;  Laterality: N/A;    History   Social History  . Marital Status: Single    Spouse Name: N/A    Number of Children: N/A  . Years of Education: N/A   Occupational History  .      student   Social History Main Topics  . Smoking status: Former Smoker -- 3 years    Types: Cigarettes    Quit date: 06/01/1990  . Smokeless tobacco: Never Used  . Alcohol Use: No  . Drug Use: No  . Sexually Active: No   Other Topics Concern  . Not on file   Social History Narrative   Regular exercise-yes    Current Outpatient Prescriptions on File Prior to Visit  Medication Sig Dispense  Refill  . albuterol (PROAIR HFA) 108 (90 BASE) MCG/ACT inhaler Inhale 2 puffs into the lungs every 6 (six) hours as needed. For shortness of breath/wheeze      . alendronate (FOSAMAX) 70 MG tablet Take 70 mg by mouth every 7 (seven) days. thursdays. Take with a full glass of water on an empty stomach.      . ALPRAZolam (XANAX) 0.5 MG tablet Take 1 tablet (0.5 mg total) by mouth 3 (three) times daily as needed for sleep.    0  . aspirin EC 81 MG tablet Take 81 mg by mouth daily.      Marland Kitchen azaTHIOprine (IMURAN) 50 MG tablet Take 50 mg by mouth 2 (two) times daily.       Marland Kitchen buPROPion (WELLBUTRIN SR) 150 MG 12 hr tablet Take 150 mg by mouth 2 (two) times daily.      . carvedilol (COREG) 3.125 MG tablet Take 3.125 mg by mouth 2 (two) times daily with a meal.      . cetirizine (ZYRTEC) 10 MG tablet Take 10 mg by mouth daily.      . citalopram (CELEXA) 20 MG tablet Take 20 mg by mouth every evening.      Marland Kitchen  clopidogrel (PLAVIX) 75 MG tablet Take 75 mg by mouth daily.      . cycloSPORINE (RESTASIS) 0.05 % ophthalmic emulsion Place 1 drop into both eyes 3 (three) times daily.      . famotidine (PEPCID) 20 MG tablet Take 20 mg by mouth 2 (two) times daily.      Marland Kitchen gabapentin (NEURONTIN) 300 MG capsule Take 300 mg by mouth 2 (two) times daily.       . hydroxychloroquine (PLAQUENIL) 200 MG tablet Take 200 mg by mouth 2 (two) times daily.       . insulin aspart (NOVOLOG FLEXPEN) 100 UNIT/ML SOPN FlexPen Inject 3 Units into the skin 3 (three) times daily with meals. In addition to 3 units with each meal add the following sliding scale coverage:  121-150 = 1 unit  151 - 200 = 2 units  201 - 250 = 3 units  251 - 300 = 5 units  301 - 350 = 7 units  351 - 400 = 9 units  3 pen  3  . isosorbide-hydrALAZINE (BIDIL) 20-37.5 MG per tablet Take 1 tablet by mouth 3 (three) times daily.      Marland Kitchen losartan (COZAAR) 100 MG tablet Take 50 mg by mouth daily.       . metoCLOPramide (REGLAN) 10 MG tablet Take 1 tablet (10 mg total)  by mouth 4 (four) times daily -  before meals and at bedtime.  120 tablet  3  . ondansetron (ZOFRAN) 4 MG tablet Take 1 tablet (4 mg total) by mouth every 6 (six) hours as needed for nausea.  20 tablet  0  . pantoprazole (PROTONIX) 40 MG tablet Take 1 tablet (40 mg total) by mouth 2 (two) times daily.      . polyethylene glycol (MIRALAX / GLYCOLAX) packet Take 17 g by mouth 2 (two) times daily.  14 each  0  . potassium chloride (K-DUR) 10 MEQ tablet Take 1 tablet (10 mEq total) by mouth daily.  30 tablet  3  . pravastatin (PRAVACHOL) 10 MG tablet Take 10 mg by mouth at bedtime.        . predniSONE (DELTASONE) 5 MG tablet Take 1 tablet (5 mg total) by mouth daily with breakfast.      . senna (SENOKOT) 8.6 MG TABS Take 2 tablets (17.2 mg total) by mouth 2 (two) times daily.  120 each  0  . torsemide (DEMADEX) 20 MG tablet Take 1 tablet (20 mg total) by mouth daily.  30 tablet  3  . traZODone (DESYREL) 100 MG tablet Take 100 mg by mouth at bedtime.       No current facility-administered medications on file prior to visit.    Allergies  Allergen Reactions  . Oxycodone-Acetaminophen Hives    FYI: no reaction to hydrocodone    Family History  Problem Relation Age of Onset  . Diabetes Mother   . Hypertension Other   . Stroke Other   . Arthritis Other     BP 122/74  Pulse 80  Ht 5\' 8"  (1.727 m)  Wt 383 lb (173.728 kg)  BMI 58.25 kg/m2  SpO2 98%  LMP 02/25/2013  Review of Systems Denies LOC.  She has lost weight, due to her efforts.    Objective:   Physical Exam VITAL SIGNS:  See vs page GENERAL: no distress  Lab Results  Component Value Date   HGBA1C 5.6 02/18/2013      Assessment & Plan:  DM: overcontrolled.  This insulin  regimen was chosen from multiple options, as it best matches her insulin to her changing requirements throughout the day.  The benefits of glycemic control must be weighed against the risks of hypoglycemia.   Weight loss.  This has drastically reduced her  insulin requirement.

## 2013-03-14 ENCOUNTER — Telehealth: Payer: Self-pay | Admitting: *Deleted

## 2013-03-14 NOTE — Telephone Encounter (Signed)
Pt says she is having a lot of bg highs. Pt states they have been 201, 200, 199 and today 210. Pt is concerned about this. Pt wants to know if Dr Everardo All would like for her to make any changes to her insulin. Please advise.

## 2013-03-14 NOTE — Telephone Encounter (Signed)
Please increase novolog to 7 units 3 times a day (just before each meal). Call if this does not help.

## 2013-03-15 NOTE — Telephone Encounter (Signed)
Pt advised and states an understanding 

## 2013-03-25 ENCOUNTER — Emergency Department (HOSPITAL_COMMUNITY)
Admission: EM | Admit: 2013-03-25 | Discharge: 2013-03-26 | Disposition: A | Discharge status: Other Acute Inpatient Hospital | Payer: Medicaid Other | Attending: Emergency Medicine | Admitting: Emergency Medicine

## 2013-03-25 ENCOUNTER — Encounter (HOSPITAL_COMMUNITY): Payer: Self-pay | Admitting: *Deleted

## 2013-03-25 DIAGNOSIS — M329 Systemic lupus erythematosus, unspecified: Secondary | ICD-10-CM | POA: Insufficient documentation

## 2013-03-25 DIAGNOSIS — I209 Angina pectoris, unspecified: Secondary | ICD-10-CM | POA: Insufficient documentation

## 2013-03-25 DIAGNOSIS — IMO0002 Reserved for concepts with insufficient information to code with codable children: Secondary | ICD-10-CM | POA: Insufficient documentation

## 2013-03-25 DIAGNOSIS — I509 Heart failure, unspecified: Secondary | ICD-10-CM | POA: Insufficient documentation

## 2013-03-25 DIAGNOSIS — R109 Unspecified abdominal pain: Secondary | ICD-10-CM

## 2013-03-25 DIAGNOSIS — K219 Gastro-esophageal reflux disease without esophagitis: Secondary | ICD-10-CM | POA: Insufficient documentation

## 2013-03-25 DIAGNOSIS — M3500 Sicca syndrome, unspecified: Secondary | ICD-10-CM | POA: Insufficient documentation

## 2013-03-25 DIAGNOSIS — Z7983 Long term (current) use of bisphosphonates: Secondary | ICD-10-CM | POA: Insufficient documentation

## 2013-03-25 DIAGNOSIS — Z3202 Encounter for pregnancy test, result negative: Secondary | ICD-10-CM | POA: Insufficient documentation

## 2013-03-25 DIAGNOSIS — F411 Generalized anxiety disorder: Secondary | ICD-10-CM | POA: Insufficient documentation

## 2013-03-25 DIAGNOSIS — Z794 Long term (current) use of insulin: Secondary | ICD-10-CM | POA: Insufficient documentation

## 2013-03-25 DIAGNOSIS — Z7982 Long term (current) use of aspirin: Secondary | ICD-10-CM | POA: Insufficient documentation

## 2013-03-25 DIAGNOSIS — Z7902 Long term (current) use of antithrombotics/antiplatelets: Secondary | ICD-10-CM | POA: Insufficient documentation

## 2013-03-25 DIAGNOSIS — Z9889 Other specified postprocedural states: Secondary | ICD-10-CM | POA: Insufficient documentation

## 2013-03-25 DIAGNOSIS — G8918 Other acute postprocedural pain: Secondary | ICD-10-CM | POA: Insufficient documentation

## 2013-03-25 DIAGNOSIS — E7800 Pure hypercholesterolemia, unspecified: Secondary | ICD-10-CM | POA: Insufficient documentation

## 2013-03-25 DIAGNOSIS — Z79899 Other long term (current) drug therapy: Secondary | ICD-10-CM | POA: Insufficient documentation

## 2013-03-25 DIAGNOSIS — Z8669 Personal history of other diseases of the nervous system and sense organs: Secondary | ICD-10-CM | POA: Insufficient documentation

## 2013-03-25 DIAGNOSIS — G43909 Migraine, unspecified, not intractable, without status migrainosus: Secondary | ICD-10-CM | POA: Insufficient documentation

## 2013-03-25 DIAGNOSIS — Z9981 Dependence on supplemental oxygen: Secondary | ICD-10-CM | POA: Insufficient documentation

## 2013-03-25 DIAGNOSIS — E119 Type 2 diabetes mellitus without complications: Secondary | ICD-10-CM | POA: Insufficient documentation

## 2013-03-25 DIAGNOSIS — Z9861 Coronary angioplasty status: Secondary | ICD-10-CM | POA: Insufficient documentation

## 2013-03-25 DIAGNOSIS — Z8673 Personal history of transient ischemic attack (TIA), and cerebral infarction without residual deficits: Secondary | ICD-10-CM | POA: Insufficient documentation

## 2013-03-25 DIAGNOSIS — G4733 Obstructive sleep apnea (adult) (pediatric): Secondary | ICD-10-CM | POA: Insufficient documentation

## 2013-03-25 DIAGNOSIS — Z87891 Personal history of nicotine dependence: Secondary | ICD-10-CM | POA: Insufficient documentation

## 2013-03-25 MED ORDER — METOCLOPRAMIDE HCL 5 MG/ML IJ SOLN
10.0000 mg | Freq: Once | INTRAMUSCULAR | Status: AC
  Administered 2013-03-25: 10 mg via INTRAVENOUS
  Filled 2013-03-25: qty 2

## 2013-03-25 MED ORDER — FENTANYL CITRATE 0.05 MG/ML IJ SOLN
100.0000 ug | Freq: Once | INTRAMUSCULAR | Status: AC
  Administered 2013-03-26: 100 ug via INTRAVENOUS
  Filled 2013-03-25: qty 2

## 2013-03-25 NOTE — ED Notes (Signed)
Pt states she is having pain in her stomach when she is throwing up.  Per EMS, pt was at [facility] today for a liver biopsy and started vomiting around 1800.  Pt given 8mg  of zofran by EMS in route and IV started.

## 2013-03-26 ENCOUNTER — Emergency Department (HOSPITAL_COMMUNITY): Payer: Medicaid Other

## 2013-03-26 LAB — POCT I-STAT, CHEM 8
BUN: 11 mg/dL (ref 6–23)
Calcium, Ion: 1.16 mmol/L (ref 1.12–1.23)
Chloride: 100 mEq/L (ref 96–112)
Glucose, Bld: 209 mg/dL — ABNORMAL HIGH (ref 70–99)
HCT: 35 % — ABNORMAL LOW (ref 36.0–46.0)
TCO2: 29 mmol/L (ref 0–100)

## 2013-03-26 LAB — HEPATIC FUNCTION PANEL
ALT: 17 U/L (ref 0–35)
AST: 29 U/L (ref 0–37)
Albumin: 3.4 g/dL — ABNORMAL LOW (ref 3.5–5.2)
Alkaline Phosphatase: 88 U/L (ref 39–117)
Total Bilirubin: 0.5 mg/dL (ref 0.3–1.2)
Total Protein: 8.1 g/dL (ref 6.0–8.3)

## 2013-03-26 LAB — CBC WITH DIFFERENTIAL/PLATELET
Basophils Relative: 0 % (ref 0–1)
Eosinophils Absolute: 0.1 10*3/uL (ref 0.0–0.7)
Eosinophils Relative: 1 % (ref 0–5)
HCT: 31.9 % — ABNORMAL LOW (ref 36.0–46.0)
Hemoglobin: 10.2 g/dL — ABNORMAL LOW (ref 12.0–15.0)
Lymphocytes Relative: 9 % — ABNORMAL LOW (ref 12–46)
MCHC: 32 g/dL (ref 30.0–36.0)
Monocytes Relative: 6 % (ref 3–12)
Neutro Abs: 12.2 10*3/uL — ABNORMAL HIGH (ref 1.7–7.7)
Neutrophils Relative %: 84 % — ABNORMAL HIGH (ref 43–77)
RBC: 3.78 MIL/uL — ABNORMAL LOW (ref 3.87–5.11)

## 2013-03-26 LAB — POCT PREGNANCY, URINE: Preg Test, Ur: NEGATIVE

## 2013-03-26 LAB — LIPASE, BLOOD: Lipase: 25 U/L (ref 11–59)

## 2013-03-26 MED ORDER — FENTANYL CITRATE 0.05 MG/ML IJ SOLN
100.0000 ug | Freq: Once | INTRAMUSCULAR | Status: AC
  Administered 2013-03-26: 100 ug via INTRAVENOUS
  Filled 2013-03-26: qty 2

## 2013-03-26 MED ORDER — IOHEXOL 300 MG/ML  SOLN: 100.0000 mL | Freq: Once | INTRAMUSCULAR | Status: DC | PRN

## 2013-03-26 MED ORDER — ONDANSETRON HCL 4 MG/2ML IJ SOLN
4.0000 mg | Freq: Once | INTRAMUSCULAR | Status: AC
  Administered 2013-03-26: 4 mg via INTRAVENOUS
  Filled 2013-03-26: qty 2

## 2013-03-26 MED ORDER — HYDROMORPHONE HCL PF 1 MG/ML IJ SOLN
1.0000 mg | Freq: Once | INTRAMUSCULAR | Status: AC
  Administered 2013-03-26: 1 mg via INTRAVENOUS
  Filled 2013-03-26: qty 1

## 2013-03-26 NOTE — ED Provider Notes (Signed)
CSN: [id number]     Arrival date & time 03/25/13  2305 History   First MD Initiated Contact with Patient 03/25/13 2332     Chief Complaint  Patient presents with  . Nausea  . Emesis   (Consider location/radiation/quality/duration/timing/severity/associated sxs/prior Treatment) Patient is a 42 y.o. female presenting with vomiting. The history is provided by the patient.  Emesis Severity:  Moderate Timing:  Constant Quality:  Stomach contents Progression:  Unchanged Chronicity:  New Recent urination:  Normal Context: not post-tussive   Relieved by:  Nothing Worsened by:  Nothing tried Ineffective treatments:  None tried Associated symptoms: abdominal pain   Associated symptoms: no chills   Associated symptoms comment:  Post procedure pain and emesis Risk factors: no alcohol use     Past Medical History  Diagnosis Date  . IDDM 08/09/2010  . Bell's palsy 08/09/2010  . SLE 08/09/2010  . SJOGREN'S SYNDROME 08/09/2010  . Hypertension   . High cholesterol   . CHF (congestive heart failure)   . Anginal pain   . Shortness of breath     "all the time"  . Obstructive sleep apnea on CPAP 2011  . Migraines   . Stroke 2010    "left side is still weak from it; never fully regained full strength"  . Anxiety   . GERD (gastroesophageal reflux disease)    Past Surgical History  Procedure Laterality Date  . Ectopic pregnancy surgery  1999  . Cardiac catheterization  02/07/12  . Hernia repair  1980's    umbilical  . Epidermoid cyst excision  2007     left breast  . Esophagogastroduodenoscopy N/A 02/26/2013    Procedure: ESOPHAGOGASTRODUODENOSCOPY (EGD);  Surgeon: Hilarie Fredrickson, MD;  Location: [facility] ENDOSCOPY;  Service: Endoscopy;  Laterality: N/A;   Family History  Problem Relation Age of Onset  . Diabetes Mother   . Hypertension Other   . Stroke Other   . Arthritis Other    History  Substance Use Topics  . Smoking status: Former Smoker -- 3 years    Types: Cigarettes    Quit date:  06/01/1990  . Smokeless tobacco: Never Used  . Alcohol Use: No   OB History   Grav Para Term Preterm Abortions TAB SAB Ect Mult Living   2 1 1       1      Review of Systems  Constitutional: Negative for chills.  Respiratory: Negative for cough and shortness of breath.   Gastrointestinal: Positive for vomiting and abdominal pain.  All other systems reviewed and are negative.    Allergies  Oxycodone-acetaminophen  Home Medications   Current Outpatient Rx  Name  Route  Sig  Dispense  Refill  . albuterol (PROAIR HFA) 108 (90 BASE) MCG/ACT inhaler   Inhalation   Inhale 2 puffs into the lungs every 6 (six) hours as needed. For shortness of breath/wheeze         . alendronate (FOSAMAX) 70 MG tablet   Oral   Take 70 mg by mouth every 7 (seven) days. thursdays. Take with a full glass of water on an empty stomach.         . ALPRAZolam (XANAX) 0.5 MG tablet   Oral   Take 1 tablet (0.5 mg total) by mouth 3 (three) times daily as needed for sleep.      0   . aspirin EC 81 MG tablet   Oral   Take 81 mg by mouth daily.         Marland Kitchen  azaTHIOprine (IMURAN) 50 MG tablet   Oral   Take 50 mg by mouth 2 (two) times daily.          Marland Kitchen buPROPion (WELLBUTRIN SR) 150 MG 12 hr tablet   Oral   Take 150 mg by mouth 2 (two) times daily.         . carvedilol (COREG) 3.125 MG tablet   Oral   Take 3.125 mg by mouth 2 (two) times daily with a meal.         . cetirizine (ZYRTEC) 10 MG tablet   Oral   Take 10 mg by mouth daily.         . citalopram (CELEXA) 20 MG tablet   Oral   Take 20 mg by mouth every evening.         . clopidogrel (PLAVIX) 75 MG tablet   Oral   Take 75 mg by mouth daily.         . cycloSPORINE (RESTASIS) 0.05 % ophthalmic emulsion   Both Eyes   Place 1 drop into both eyes 3 (three) times daily.         . famotidine (PEPCID) 20 MG tablet   Oral   Take 20 mg by mouth 2 (two) times daily.         Marland Kitchen gabapentin (NEURONTIN) 300 MG capsule    Oral   Take 300 mg by mouth 2 (two) times daily.          . hydroxychloroquine (PLAQUENIL) 200 MG tablet   Oral   Take 200 mg by mouth 2 (two) times daily.          . insulin aspart (NOVOLOG FLEXPEN) 100 UNIT/ML SOPN FlexPen   Subcutaneous   Inject 3 Units into the skin 3 (three) times daily with meals. In addition to 3 units with each meal add the following sliding scale coverage:  121-150 = 1 unit  151 - 200 = 2 units  201 - 250 = 3 units  251 - 300 = 5 units  301 - 350 = 7 units  351 - 400 = 9 units   3 pen   3   . isosorbide-hydrALAZINE (BIDIL) 20-37.5 MG per tablet   Oral   Take 1 tablet by mouth 3 (three) times daily.         Marland Kitchen losartan (COZAAR) 100 MG tablet   Oral   Take 50 mg by mouth daily.          . metoCLOPramide (REGLAN) 10 MG tablet   Oral   Take 1 tablet (10 mg total) by mouth 4 (four) times daily -  before meals and at bedtime.   120 tablet   3   . pantoprazole (PROTONIX) 40 MG tablet   Oral   Take 1 tablet (40 mg total) by mouth 2 (two) times daily.         . polyethylene glycol (MIRALAX / GLYCOLAX) packet   Oral   Take 17 g by mouth 2 (two) times daily.   14 each   0   . potassium chloride (K-DUR) 10 MEQ tablet   Oral   Take 1 tablet (10 mEq total) by mouth daily.   30 tablet   3   . pravastatin (PRAVACHOL) 10 MG tablet   Oral   Take 10 mg by mouth at bedtime.           . predniSONE (DELTASONE) 5 MG tablet   Oral   Take 1 tablet (  5 mg total) by mouth daily with breakfast.         . senna (SENOKOT) 8.6 MG TABS   Oral   Take 2 tablets (17.2 mg total) by mouth 2 (two) times daily.   120 each   0   . torsemide (DEMADEX) 20 MG tablet   Oral   Take 1 tablet (20 mg total) by mouth daily.   30 tablet   3   . traZODone (DESYREL) 100 MG tablet   Oral   Take 100 mg by mouth at bedtime.          BP 131/79  Pulse 97  Temp(Src) 98.1 F (36.7 C) (Oral)  Resp 12  SpO2 99%  LMP 02/25/2013 Physical Exam   Constitutional: She is oriented to person, place, and time. She appears well-developed and well-nourished. No distress.  HENT:  Head: Normocephalic and atraumatic.  Mouth/Throat: Oropharynx is clear and moist.  Eyes: Conjunctivae are normal. Pupils are equal, round, and reactive to light.  Neck: Normal range of motion. Neck supple.  Cardiovascular: Normal rate, regular rhythm and intact distal pulses.   Pulmonary/Chest: Effort normal and breath sounds normal. She has no wheezes. She has no rales.  Abdominal: Soft. Bowel sounds are normal. There is no tenderness. There is no rebound and no guarding.  Musculoskeletal: Normal range of motion.  Neurological: She is alert and oriented to person, place, and time.  Skin: Skin is warm and dry.  Psychiatric: She has a normal mood and affect.    ED Course  Procedures (including critical care time) Labs Review Labs Reviewed  CBC WITH DIFFERENTIAL - Abnormal; Notable for the following:    RBC 3.78 (*)    Hemoglobin 10.2 (*)    HCT 31.9 (*)    All other components within normal limits  HEPATIC FUNCTION PANEL - Abnormal; Notable for the following:    Albumin 3.4 (*)    All other components within normal limits  POCT I-STAT, CHEM 8 - Abnormal; Notable for the following:    Creatinine, Ser 1.20 (*)    Glucose, Bld 209 (*)    Hemoglobin 11.9 (*)    HCT 35.0 (*)    All other components within normal limits  LIPASE, BLOOD  POCT PREGNANCY, URINE   Imaging Review No results found.  MDM  Case d/w Dr. Lady Gary who will accept the patient to med surg bed   Aneshia Jacquet K Jasan Doughtie-Rasch, MD 03/26/13 [phone]

## 2013-04-01 ENCOUNTER — Inpatient Hospital Stay (HOSPITAL_COMMUNITY): Payer: Medicaid Other

## 2013-04-01 ENCOUNTER — Encounter (HOSPITAL_COMMUNITY): Payer: Self-pay | Admitting: *Deleted

## 2013-04-01 ENCOUNTER — Inpatient Hospital Stay (HOSPITAL_COMMUNITY)
Admission: EM | Admit: 2013-04-01 | Discharge: 2013-04-06 | DRG: 391 | Disposition: A | Discharge status: Home / Self Care | Payer: Medicaid Other | Attending: Internal Medicine | Admitting: Internal Medicine

## 2013-04-01 ENCOUNTER — Emergency Department (HOSPITAL_COMMUNITY): Payer: Medicaid Other

## 2013-04-01 DIAGNOSIS — R1013 Epigastric pain: Secondary | ICD-10-CM

## 2013-04-01 DIAGNOSIS — R160 Hepatomegaly, not elsewhere classified: Secondary | ICD-10-CM | POA: Diagnosis present

## 2013-04-01 DIAGNOSIS — M329 Systemic lupus erythematosus, unspecified: Secondary | ICD-10-CM | POA: Diagnosis present

## 2013-04-01 DIAGNOSIS — Z8673 Personal history of transient ischemic attack (TIA), and cerebral infarction without residual deficits: Secondary | ICD-10-CM

## 2013-04-01 DIAGNOSIS — G8929 Other chronic pain: Secondary | ICD-10-CM

## 2013-04-01 DIAGNOSIS — Z823 Family history of stroke: Secondary | ICD-10-CM

## 2013-04-01 DIAGNOSIS — I5032 Chronic diastolic (congestive) heart failure: Secondary | ICD-10-CM

## 2013-04-01 DIAGNOSIS — Z7982 Long term (current) use of aspirin: Secondary | ICD-10-CM

## 2013-04-01 DIAGNOSIS — D649 Anemia, unspecified: Secondary | ICD-10-CM

## 2013-04-01 DIAGNOSIS — R1310 Dysphagia, unspecified: Secondary | ICD-10-CM

## 2013-04-01 DIAGNOSIS — I509 Heart failure, unspecified: Secondary | ICD-10-CM | POA: Diagnosis present

## 2013-04-01 DIAGNOSIS — I5023 Acute on chronic systolic (congestive) heart failure: Secondary | ICD-10-CM | POA: Diagnosis present

## 2013-04-01 DIAGNOSIS — IMO0002 Reserved for concepts with insufficient information to code with codable children: Secondary | ICD-10-CM

## 2013-04-01 DIAGNOSIS — R1110 Vomiting, unspecified: Secondary | ICD-10-CM

## 2013-04-01 DIAGNOSIS — I10 Essential (primary) hypertension: Secondary | ICD-10-CM

## 2013-04-01 DIAGNOSIS — I5022 Chronic systolic (congestive) heart failure: Secondary | ICD-10-CM | POA: Diagnosis present

## 2013-04-01 DIAGNOSIS — E860 Dehydration: Secondary | ICD-10-CM | POA: Diagnosis present

## 2013-04-01 DIAGNOSIS — E162 Hypoglycemia, unspecified: Secondary | ICD-10-CM | POA: Diagnosis present

## 2013-04-01 DIAGNOSIS — I5042 Chronic combined systolic (congestive) and diastolic (congestive) heart failure: Secondary | ICD-10-CM | POA: Diagnosis present

## 2013-04-01 DIAGNOSIS — E2749 Other adrenocortical insufficiency: Secondary | ICD-10-CM | POA: Diagnosis present

## 2013-04-01 DIAGNOSIS — K3184 Gastroparesis: Principal | ICD-10-CM

## 2013-04-01 DIAGNOSIS — R109 Unspecified abdominal pain: Secondary | ICD-10-CM

## 2013-04-01 DIAGNOSIS — Z833 Family history of diabetes mellitus: Secondary | ICD-10-CM

## 2013-04-01 DIAGNOSIS — N179 Acute kidney failure, unspecified: Secondary | ICD-10-CM

## 2013-04-01 DIAGNOSIS — K7689 Other specified diseases of liver: Secondary | ICD-10-CM | POA: Diagnosis present

## 2013-04-01 DIAGNOSIS — E1149 Type 2 diabetes mellitus with other diabetic neurological complication: Secondary | ICD-10-CM | POA: Diagnosis present

## 2013-04-01 DIAGNOSIS — E1169 Type 2 diabetes mellitus with other specified complication: Secondary | ICD-10-CM | POA: Diagnosis present

## 2013-04-01 DIAGNOSIS — E1159 Type 2 diabetes mellitus with other circulatory complications: Secondary | ICD-10-CM | POA: Diagnosis present

## 2013-04-01 DIAGNOSIS — Z87891 Personal history of nicotine dependence: Secondary | ICD-10-CM

## 2013-04-01 DIAGNOSIS — R634 Abnormal weight loss: Secondary | ICD-10-CM

## 2013-04-01 DIAGNOSIS — E1143 Type 2 diabetes mellitus with diabetic autonomic (poly)neuropathy: Secondary | ICD-10-CM

## 2013-04-01 DIAGNOSIS — K2100 Gastro-esophageal reflux disease with esophagitis, without bleeding: Secondary | ICD-10-CM | POA: Diagnosis present

## 2013-04-01 DIAGNOSIS — B370 Candidal stomatitis: Secondary | ICD-10-CM | POA: Diagnosis present

## 2013-04-01 DIAGNOSIS — Z794 Long term (current) use of insulin: Secondary | ICD-10-CM

## 2013-04-01 DIAGNOSIS — I5043 Acute on chronic combined systolic (congestive) and diastolic (congestive) heart failure: Secondary | ICD-10-CM | POA: Diagnosis present

## 2013-04-01 DIAGNOSIS — G4733 Obstructive sleep apnea (adult) (pediatric): Secondary | ICD-10-CM | POA: Diagnosis present

## 2013-04-01 DIAGNOSIS — Z885 Allergy status to narcotic agent status: Secondary | ICD-10-CM

## 2013-04-01 DIAGNOSIS — M3500 Sicca syndrome, unspecified: Secondary | ICD-10-CM | POA: Diagnosis present

## 2013-04-01 DIAGNOSIS — E119 Type 2 diabetes mellitus without complications: Secondary | ICD-10-CM

## 2013-04-01 HISTORY — DX: Type 2 diabetes mellitus with diabetic autonomic (poly)neuropathy: K31.84

## 2013-04-01 HISTORY — DX: Hepatomegaly, not elsewhere classified: R16.0

## 2013-04-01 HISTORY — DX: Type 2 diabetes mellitus with diabetic autonomic (poly)neuropathy: E11.43

## 2013-04-01 HISTORY — DX: Dependence on supplemental oxygen: Z99.81

## 2013-04-01 HISTORY — DX: Acute kidney failure, unspecified: N17.9

## 2013-04-01 LAB — LIPASE, BLOOD: Lipase: 22 U/L (ref 11–59)

## 2013-04-01 LAB — PROTIME-INR: Prothrombin Time: 14.1 seconds (ref 11.6–15.2)

## 2013-04-01 LAB — BASIC METABOLIC PANEL
BUN: 23 mg/dL (ref 6–23)
CO2: 27 mEq/L (ref 19–32)
Calcium: 10.1 mg/dL (ref 8.4–10.5)
Chloride: 94 mEq/L — ABNORMAL LOW (ref 96–112)
Creatinine, Ser: 2.05 mg/dL — ABNORMAL HIGH (ref 0.50–1.10)

## 2013-04-01 LAB — CREATININE, SERUM
GFR calc Af Amer: 33 mL/min — ABNORMAL LOW (ref >90)
GFR calc non Af Amer: 28 mL/min — ABNORMAL LOW (ref >90)

## 2013-04-01 LAB — HEPATIC FUNCTION PANEL
Alkaline Phosphatase: 99 U/L (ref 39–117)
Bilirubin, Direct: 0.2 mg/dL (ref 0.0–0.3)
Indirect Bilirubin: 0.7 mg/dL (ref 0.3–0.9)
Total Bilirubin: 0.9 mg/dL (ref 0.3–1.2)

## 2013-04-01 LAB — CBC
HCT: 29.2 % — ABNORMAL LOW (ref 36.0–46.0)
Platelets: 424 10*3/uL — ABNORMAL HIGH (ref 150–400)
RBC: 3.59 MIL/uL — ABNORMAL LOW (ref 3.87–5.11)
RDW: 14.9 % (ref 11.5–15.5)
WBC: 13 10*3/uL — ABNORMAL HIGH (ref 4.0–10.5)

## 2013-04-01 LAB — CBC WITH DIFFERENTIAL/PLATELET
Basophils Absolute: 0 10*3/uL (ref 0.0–0.1)
Eosinophils Absolute: 0 10*3/uL (ref 0.0–0.7)
HCT: 30.6 % — ABNORMAL LOW (ref 36.0–46.0)
Lymphs Abs: 2.4 10*3/uL (ref 0.7–4.0)
MCHC: 34.3 g/dL (ref 30.0–36.0)
MCV: 80.1 fL (ref 78.0–100.0)
Monocytes Absolute: 1.5 10*3/uL — ABNORMAL HIGH (ref 0.1–1.0)
Neutro Abs: 9.7 10*3/uL — ABNORMAL HIGH (ref 1.7–7.7)
Platelets: 445 10*3/uL — ABNORMAL HIGH (ref 150–400)
RDW: 14.5 % (ref 11.5–15.5)

## 2013-04-01 LAB — TSH: TSH: 1.497 u[IU]/mL (ref 0.350–4.500)

## 2013-04-01 LAB — TROPONIN I: Troponin I: <0.3 ng/mL (ref <0.30)

## 2013-04-01 MED ORDER — FLUCONAZOLE 100MG IVPB
100.0000 mg | INTRAVENOUS | Status: DC
  Administered 2013-04-01 – 2013-04-05 (×5): 100 mg via INTRAVENOUS
  Filled 2013-04-01 (×6): qty 50

## 2013-04-01 MED ORDER — ALUM & MAG HYDROXIDE-SIMETH 200-200-20 MG/5ML PO SUSP: 30.0000 mL | Freq: Four times a day (QID) | ORAL | Status: DC | PRN

## 2013-04-01 MED ORDER — INSULIN ASPART 100 UNIT/ML FLEXPEN: 3.0000 [IU] | PEN_INJECTOR | Freq: Three times a day (TID) | SUBCUTANEOUS | Status: DC

## 2013-04-01 MED ORDER — HEPARIN SODIUM (PORCINE) 5000 UNIT/ML IJ SOLN
5000.0000 [IU] | Freq: Three times a day (TID) | INTRAMUSCULAR | Status: DC
  Administered 2013-04-01 – 2013-04-06 (×14): 5000 [IU] via SUBCUTANEOUS
  Filled 2013-04-01 (×15): qty 1

## 2013-04-01 MED ORDER — HYDROXYCHLOROQUINE SULFATE 200 MG PO TABS
200.0000 mg | ORAL_TABLET | Freq: Two times a day (BID) | ORAL | Status: DC
  Administered 2013-04-01 – 2013-04-06 (×7): 200 mg via ORAL
  Filled 2013-04-01 (×11): qty 1

## 2013-04-01 MED ORDER — POLYETHYLENE GLYCOL 3350 17 G PO PACK
17.0000 g | PACK | Freq: Two times a day (BID) | ORAL | Status: DC
  Filled 2013-04-01 (×11): qty 1

## 2013-04-01 MED ORDER — SENNA 8.6 MG PO TABS
2.0000 | ORAL_TABLET | Freq: Two times a day (BID) | ORAL | Status: DC
  Administered 2013-04-01 – 2013-04-02 (×3): 17.2 mg via ORAL
  Filled 2013-04-01 (×3): qty 2
  Filled 2013-04-01: qty 1
  Filled 2013-04-01 (×9): qty 2

## 2013-04-01 MED ORDER — ONDANSETRON HCL 4 MG/2ML IJ SOLN
4.0000 mg | Freq: Once | INTRAMUSCULAR | Status: AC
  Administered 2013-04-01: 4 mg via INTRAVENOUS
  Filled 2013-04-01: qty 2

## 2013-04-01 MED ORDER — ALBUTEROL SULFATE (5 MG/ML) 0.5% IN NEBU
2.5000 mg | INHALATION_SOLUTION | RESPIRATORY_TRACT | Status: DC | PRN
Start: 2013-04-01 — End: 2013-04-06

## 2013-04-01 MED ORDER — POTASSIUM CHLORIDE ER 10 MEQ PO TBCR
10.0000 meq | EXTENDED_RELEASE_TABLET | Freq: Every day | ORAL | Status: DC
  Administered 2013-04-01 – 2013-04-02 (×2): 10 meq via ORAL
  Filled 2013-04-01 (×3): qty 1

## 2013-04-01 MED ORDER — ASPIRIN EC 81 MG PO TBEC
81.0000 mg | DELAYED_RELEASE_TABLET | Freq: Every day | ORAL | Status: DC
  Administered 2013-04-01 – 2013-04-06 (×4): 81 mg via ORAL
  Filled 2013-04-01 (×6): qty 1

## 2013-04-01 MED ORDER — LORATADINE 10 MG PO TABS
10.0000 mg | ORAL_TABLET | Freq: Every day | ORAL | Status: DC
  Administered 2013-04-01 – 2013-04-05 (×3): 10 mg via ORAL
  Filled 2013-04-01 (×6): qty 1

## 2013-04-01 MED ORDER — AZATHIOPRINE 50 MG PO TABS
50.0000 mg | ORAL_TABLET | Freq: Two times a day (BID) | ORAL | Status: DC
  Administered 2013-04-01 – 2013-04-06 (×7): 50 mg via ORAL
  Filled 2013-04-01 (×13): qty 1

## 2013-04-01 MED ORDER — SODIUM CHLORIDE 0.9 % IV SOLN
INTRAVENOUS | Status: DC
  Administered 2013-04-01: 14:00:00 via INTRAVENOUS

## 2013-04-01 MED ORDER — ONDANSETRON HCL 4 MG/2ML IJ SOLN
4.0000 mg | Freq: Four times a day (QID) | INTRAMUSCULAR | Status: DC | PRN
  Administered 2013-04-01 – 2013-04-06 (×11): 4 mg via INTRAVENOUS
  Filled 2013-04-01 (×10): qty 2

## 2013-04-01 MED ORDER — GUAIFENESIN-DM 100-10 MG/5ML PO SYRP: 5.0000 mL | ORAL_SOLUTION | ORAL | Status: DC | PRN

## 2013-04-01 MED ORDER — INSULIN ASPART 100 UNIT/ML ~~LOC~~ SOLN
0.0000 [IU] | Freq: Four times a day (QID) | SUBCUTANEOUS | Status: DC
  Administered 2013-04-01 – 2013-04-02 (×3): 1 [IU] via SUBCUTANEOUS
  Administered 2013-04-02: 3 [IU] via SUBCUTANEOUS
  Administered 2013-04-03: 2 [IU] via SUBCUTANEOUS
  Administered 2013-04-03: 1 [IU] via SUBCUTANEOUS

## 2013-04-01 MED ORDER — PREDNISONE 5 MG PO TABS
5.0000 mg | ORAL_TABLET | Freq: Every day | ORAL | Status: DC
  Administered 2013-04-02 – 2013-04-03 (×2): 5 mg via ORAL
  Filled 2013-04-01 (×4): qty 1

## 2013-04-01 MED ORDER — MORPHINE SULFATE 2 MG/ML IJ SOLN
2.0000 mg | INTRAMUSCULAR | Status: DC | PRN
  Administered 2013-04-01 – 2013-04-03 (×7): 2 mg via INTRAVENOUS
  Filled 2013-04-01 (×7): qty 1

## 2013-04-01 MED ORDER — SIMVASTATIN 10 MG PO TABS: 10.0000 mg | ORAL_TABLET | Freq: Every day | ORAL | Status: DC

## 2013-04-01 MED ORDER — CYCLOSPORINE 0.05 % OP EMUL
1.0000 [drp] | Freq: Three times a day (TID) | OPHTHALMIC | Status: DC
  Administered 2013-04-01 – 2013-04-05 (×12): 1 [drp] via OPHTHALMIC
  Filled 2013-04-01 (×16): qty 1

## 2013-04-01 MED ORDER — MAGNESIUM HYDROXIDE 400 MG/5ML PO SUSP: 30.0000 mL | Freq: Every day | ORAL | Status: DC | PRN

## 2013-04-01 MED ORDER — CITALOPRAM HYDROBROMIDE 20 MG PO TABS
20.0000 mg | ORAL_TABLET | Freq: Every evening | ORAL | Status: DC
  Administered 2013-04-01 – 2013-04-02 (×2): 20 mg via ORAL
  Filled 2013-04-01 (×3): qty 1

## 2013-04-01 MED ORDER — CLOPIDOGREL BISULFATE 75 MG PO TABS
75.0000 mg | ORAL_TABLET | Freq: Every day | ORAL | Status: DC
  Administered 2013-04-01 – 2013-04-06 (×4): 75 mg via ORAL
  Filled 2013-04-01 (×6): qty 1

## 2013-04-01 MED ORDER — ISOSORB DINITRATE-HYDRALAZINE 20-37.5 MG PO TABS
1.0000 | ORAL_TABLET | Freq: Three times a day (TID) | ORAL | Status: DC
  Administered 2013-04-01 – 2013-04-05 (×10): 1 via ORAL
  Filled 2013-04-01 (×16): qty 1

## 2013-04-01 MED ORDER — ALPRAZOLAM 0.5 MG PO TABS: 0.5000 mg | ORAL_TABLET | Freq: Two times a day (BID) | ORAL | Status: DC | PRN

## 2013-04-01 MED ORDER — PANTOPRAZOLE SODIUM 40 MG IV SOLR
40.0000 mg | Freq: Two times a day (BID) | INTRAVENOUS | Status: DC
  Administered 2013-04-01 – 2013-04-04 (×6): 40 mg via INTRAVENOUS
  Filled 2013-04-01 (×7): qty 40

## 2013-04-01 MED ORDER — PRAVASTATIN SODIUM 10 MG PO TABS
10.0000 mg | ORAL_TABLET | Freq: Every day | ORAL | Status: DC
  Administered 2013-04-01 – 2013-04-05 (×4): 10 mg via ORAL
  Filled 2013-04-01 (×6): qty 1

## 2013-04-01 MED ORDER — TRAZODONE HCL 100 MG PO TABS
100.0000 mg | ORAL_TABLET | Freq: Every day | ORAL | Status: DC
  Administered 2013-04-01: 100 mg via ORAL
  Filled 2013-04-01 (×3): qty 1

## 2013-04-01 MED ORDER — METOCLOPRAMIDE HCL 5 MG/ML IJ SOLN
10.0000 mg | Freq: Four times a day (QID) | INTRAMUSCULAR | Status: DC
  Administered 2013-04-01 – 2013-04-03 (×7): 10 mg via INTRAVENOUS
  Filled 2013-04-01 (×12): qty 2

## 2013-04-01 MED ORDER — HYDROMORPHONE HCL PF 1 MG/ML IJ SOLN
1.0000 mg | INTRAMUSCULAR | Status: AC | PRN
  Administered 2013-04-01 (×2): 1 mg via INTRAVENOUS
  Filled 2013-04-01 (×2): qty 1

## 2013-04-01 MED ORDER — GABAPENTIN 300 MG PO CAPS
300.0000 mg | ORAL_CAPSULE | Freq: Two times a day (BID) | ORAL | Status: DC
  Administered 2013-04-01 – 2013-04-06 (×7): 300 mg via ORAL
  Filled 2013-04-01 (×11): qty 1

## 2013-04-01 MED ORDER — ONDANSETRON HCL 4 MG PO TABS: 4.0000 mg | ORAL_TABLET | Freq: Four times a day (QID) | ORAL | Status: DC | PRN

## 2013-04-01 MED ORDER — POTASSIUM CHLORIDE IN NACL 20-0.9 MEQ/L-% IV SOLN
INTRAVENOUS | Status: AC
  Administered 2013-04-01: 125 mL/h via INTRAVENOUS
  Administered 2013-04-01: 15:00:00 via INTRAVENOUS
  Filled 2013-04-01 (×4): qty 1000

## 2013-04-01 MED ORDER — INSULIN ASPART 100 UNIT/ML ~~LOC~~ SOLN
3.0000 [IU] | Freq: Three times a day (TID) | SUBCUTANEOUS | Status: DC
  Administered 2013-04-02 – 2013-04-03 (×3): 3 [IU] via SUBCUTANEOUS

## 2013-04-01 MED ORDER — BUPROPION HCL ER (SR) 150 MG PO TB12
150.0000 mg | ORAL_TABLET | Freq: Two times a day (BID) | ORAL | Status: DC
  Administered 2013-04-01 – 2013-04-03 (×3): 150 mg via ORAL
  Filled 2013-04-01 (×5): qty 1

## 2013-04-01 MED ORDER — CARVEDILOL 3.125 MG PO TABS
3.1250 mg | ORAL_TABLET | Freq: Two times a day (BID) | ORAL | Status: DC
  Administered 2013-04-01 – 2013-04-06 (×7): 3.125 mg via ORAL
  Filled 2013-04-01 (×12): qty 1

## 2013-04-01 NOTE — ED Notes (Signed)
Pt c/o emesis x 4 days.  Pt c/o sob and chest pain immediately following an episode of vomiting.  Pt is on 2 L o2 at home for chf and states she has not had to increase her o2 even though she is sob.  No increased edema per pt.

## 2013-04-01 NOTE — ED Provider Notes (Signed)
CSN: [id number]     Arrival date & time 04/01/13  1139 History   First MD Initiated Contact with Patient 04/01/13 1211     Chief Complaint  Patient presents with  . Emesis  . Chest Pain  . Shortness of Breath   (Consider location/radiation/quality/duration/timing/severity/associated sxs/prior Treatment) HPI Comments: [name] is a 42 y.o. Female who presents for evaluation of chronic abdominal pain and vomiting. That is not responsive to her usual medications. She feels that she is worsening over the last 4 days. She was recently discharged from [facility] help after an evaluation for a liver mass. They plan on proceeding with a form of radiation, as treatment. This has not yet been scheduled. She was told that the liver mass is not causing her vomiting and abdominal pain. She's been treated for gastroparesis, but states that the Reglan "does not help." She has also tried Zofran and Phenergan, without relief. She denies fever, or chills, cough, or change in bowel or urinary habits. She has intermittent abdominal pain that is diffuse. The pain is ongoing for several months. There no other modifying factors.  Patient is a 42 y.o. female presenting with vomiting, chest pain, and shortness of breath. The history is provided by the patient.  Emesis Chest Pain Associated symptoms: shortness of breath and vomiting   Shortness of Breath Associated symptoms: chest pain and vomiting     Past Medical History  Diagnosis Date  . IDDM 08/09/2010  . Bell's palsy 08/09/2010  . SLE 08/09/2010  . SJOGREN'S SYNDROME 08/09/2010  . Hypertension   . High cholesterol   . Anginal pain   . Shortness of breath     "all the time"  . Obstructive sleep apnea on CPAP 2011  . Migraines   . Stroke 2010    "left side is still weak from it; never fully regained full strength"  . Anxiety   . GERD (gastroesophageal reflux disease)   . CHF (congestive heart failure)    Past Surgical History  Procedure  Laterality Date  . Ectopic pregnancy surgery  1999  . Cardiac catheterization  02/07/12  . Hernia repair  1980's    umbilical  . Epidermoid cyst excision  2007     left breast  . Esophagogastroduodenoscopy N/A 02/26/2013    Procedure: ESOPHAGOGASTRODUODENOSCOPY (EGD);  Surgeon: Hilarie Fredrickson, MD;  Location: [facility] ENDOSCOPY;  Service: Endoscopy;  Laterality: N/A;   Family History  Problem Relation Age of Onset  . Diabetes Mother   . Hypertension Other   . Stroke Other   . Arthritis Other    History  Substance Use Topics  . Smoking status: Former Smoker -- 3 years    Types: Cigarettes    Quit date: 06/01/1990  . Smokeless tobacco: Never Used  . Alcohol Use: No   OB History   Grav Para Term Preterm Abortions TAB SAB Ect Mult Living   2 1 1       1      Review of Systems  Respiratory: Positive for shortness of breath.   Cardiovascular: Positive for chest pain.  Gastrointestinal: Positive for vomiting.  All other systems reviewed and are negative.    Allergies  Oxycodone-acetaminophen  Home Medications   Current Outpatient Rx  Name  Route  Sig  Dispense  Refill  . albuterol (PROAIR HFA) 108 (90 BASE) MCG/ACT inhaler   Inhalation   Inhale 2 puffs into the lungs every 6 (six) hours as needed. For shortness of breath/wheeze         .  alendronate (FOSAMAX) 70 MG tablet   Oral   Take 70 mg by mouth every 7 (seven) days. thursdays. Take with a full glass of water on an empty stomach.         . ALPRAZolam (XANAX) 0.5 MG tablet   Oral   Take 1 tablet (0.5 mg total) by mouth 3 (three) times daily as needed for sleep.      0   . aspirin EC 81 MG tablet   Oral   Take 81 mg by mouth daily.         Marland Kitchen azaTHIOprine (IMURAN) 50 MG tablet   Oral   Take 50 mg by mouth 2 (two) times daily.          Marland Kitchen buPROPion (WELLBUTRIN SR) 150 MG 12 hr tablet   Oral   Take 150 mg by mouth 2 (two) times daily.         . carvedilol (COREG) 3.125 MG tablet   Oral   Take 3.125 mg by  mouth 2 (two) times daily with a meal.         . cetirizine (ZYRTEC) 10 MG tablet   Oral   Take 10 mg by mouth daily.         . citalopram (CELEXA) 20 MG tablet   Oral   Take 20 mg by mouth every evening.         . clopidogrel (PLAVIX) 75 MG tablet   Oral   Take 75 mg by mouth daily.         . cycloSPORINE (RESTASIS) 0.05 % ophthalmic emulsion   Both Eyes   Place 1 drop into both eyes 3 (three) times daily.         . famotidine (PEPCID) 20 MG tablet   Oral   Take 20 mg by mouth 2 (two) times daily.         Marland Kitchen gabapentin (NEURONTIN) 300 MG capsule   Oral   Take 300 mg by mouth 2 (two) times daily.          . hydroxychloroquine (PLAQUENIL) 200 MG tablet   Oral   Take 200 mg by mouth 2 (two) times daily.          . insulin aspart (NOVOLOG FLEXPEN) 100 UNIT/ML SOPN FlexPen   Subcutaneous   Inject 3 Units into the skin 3 (three) times daily with meals. In addition to 3 units with each meal add the following sliding scale coverage:  121-150 = 1 unit  151 - 200 = 2 units  201 - 250 = 3 units  251 - 300 = 5 units  301 - 350 = 7 units  351 - 400 = 9 units   3 pen   3   . isosorbide-hydrALAZINE (BIDIL) 20-37.5 MG per tablet   Oral   Take 1 tablet by mouth 3 (three) times daily.         Marland Kitchen losartan (COZAAR) 100 MG tablet   Oral   Take 50 mg by mouth daily.          . metoCLOPramide (REGLAN) 10 MG tablet   Oral   Take 1 tablet (10 mg total) by mouth 4 (four) times daily -  before meals and at bedtime.   120 tablet   3   . pantoprazole (PROTONIX) 40 MG tablet   Oral   Take 1 tablet (40 mg total) by mouth 2 (two) times daily.         . polyethylene  glycol (MIRALAX / GLYCOLAX) packet   Oral   Take 17 g by mouth 2 (two) times daily.   14 each   0   . potassium chloride (K-DUR) 10 MEQ tablet   Oral   Take 1 tablet (10 mEq total) by mouth daily.   30 tablet   3   . pravastatin (PRAVACHOL) 10 MG tablet   Oral   Take 10 mg by mouth at bedtime.            . predniSONE (DELTASONE) 5 MG tablet   Oral   Take 1 tablet (5 mg total) by mouth daily with breakfast.         . senna (SENOKOT) 8.6 MG TABS   Oral   Take 2 tablets (17.2 mg total) by mouth 2 (two) times daily.   120 each   0   . torsemide (DEMADEX) 20 MG tablet   Oral   Take 1 tablet (20 mg total) by mouth daily.   30 tablet   3   . traZODone (DESYREL) 100 MG tablet   Oral   Take 100 mg by mouth at bedtime.          BP 136/104  Pulse 94  Temp(Src) 98.2 F (36.8 C) (Oral)  Resp 13  Ht 5\' 8"  (1.727 m)  Wt 362 lb 12.8 oz (164.565 kg)  BMI 55.18 kg/m2  SpO2 99% Physical Exam  Nursing note and vitals reviewed. Constitutional: She is oriented to person, place, and time. She appears well-developed.  Obese  HENT:  Head: Normocephalic and atraumatic.  Eyes: Conjunctivae and EOM are normal. Pupils are equal, round, and reactive to light.  Neck: Normal range of motion and phonation normal. Neck supple.  Cardiovascular: Normal rate, regular rhythm and intact distal pulses.   Pulmonary/Chest: Effort normal and breath sounds normal. No respiratory distress. She has no wheezes. She exhibits no tenderness.  Abdominal: Soft. Bowel sounds are normal. She exhibits no distension. There is no tenderness (mild diffuse tenderness.). There is no guarding.  Vomiting yellow mucus. During evaluation  Musculoskeletal: Normal range of motion.  Neurological: She is alert and oriented to person, place, and time. She has normal strength. She exhibits normal muscle tone.  Skin: Skin is warm and dry.  Psychiatric: She has a normal mood and affect. Her behavior is normal. Judgment and thought content normal.    ED Course  Procedures (including critical care time)  03/14/2013 she weighed 173 kg. Today, she is on 164 kg  2:05 PM-Consult complete with Thedore Mins. Patient case explained and discussed. He agrees to see patient to decide if she would be best managed here or at [facility]. Call ended at 1410  Labs Review Labs Reviewed  BASIC METABOLIC PANEL - Abnormal; Notable for the following:    Potassium 3.2 (*)    Chloride 94 (*)    Glucose, Bld 166 (*)    Creatinine, Ser 2.05 (*)    GFR calc non Af Amer 29 (*)    GFR calc Af Amer 33 (*)    All other components within normal limits  CBC WITH DIFFERENTIAL - Abnormal; Notable for the following:    WBC 13.6 (*)    RBC 3.82 (*)    Hemoglobin 10.5 (*)    HCT 30.6 (*)    Platelets 445 (*)    Neutro Abs 9.7 (*)    Monocytes Absolute 1.5 (*)    All other components within normal limits  HEPATIC FUNCTION  PANEL - Abnormal; Notable for the following:    Total Protein 8.8 (*)    All other components within normal limits  PRO B NATRIURETIC PEPTIDE  LIPASE, BLOOD  POCT I-STAT TROPONIN I   Imaging Review Dg Chest 2 View  04/01/2013   *RADIOLOGY REPORT*  Clinical Data: Emesis, chest pain, shortness of breath  CHEST - 2 VIEW  Comparison: 02/26/2013  Findings: Mild cardiomegaly.  Vascular pattern normal.  Lungs clear.  IMPRESSION: No acute findings.   Original Report Authenticated By: Esperanza Heir, M.D.    MDM   1. Acute renal failure   2. Weight loss   3. Chronic abdominal pain   4. Gastroparesis     Chronic abdominal pain with vomiting, and acute renal insufficiency with 18 pound weight loss over 2 weeks. She's been there for further treatment, stabilization and likely gastroenterologic consult   Nursing Notes Reviewed/ Care Coordinated, and agree without changes. Applicable Imaging Reviewed.  Interpretation of Laboratory Data incorporated into ED treatment   Plan: Admit  Flint Melter, MD 04/01/13 1941

## 2013-04-01 NOTE — H&P (Addendum)
[facility]                                                                                    Patient Demographics  [name], is a 42 y.o. female  MRN: [id number]   DOB - [birth year]  Admit Date - 04/01/2013  Outpatient Primary MD for the patient is Ron Parker, MD,   [facility] Dr[facility] Marina Goodell, [facility] physicians are  Dr. Deveron Furlong and Dr. [name]    With History of -  Past Medical History  Diagnosis Date  . IDDM 08/09/2010  . Bell's palsy 08/09/2010  . SLE 08/09/2010  . SJOGREN'S SYNDROME 08/09/2010  . Hypertension   . High cholesterol   . Anginal pain   . Shortness of breath     "all the time"  . Obstructive sleep apnea on CPAP 2011  . Migraines   . Stroke 2010    "left side is still weak from it; never fully regained full strength"  . Anxiety   . GERD (gastroesophageal reflux disease)   . CHF (congestive heart failure)       Past Surgical History  Procedure Laterality Date  . Ectopic pregnancy surgery  1999  . Cardiac catheterization  02/07/12  . Hernia repair  1980's    umbilical  . Epidermoid cyst excision  2007     left breast  . Esophagogastroduodenoscopy N/A 02/26/2013    Procedure: ESOPHAGOGASTRODUODENOSCOPY (EGD);  Surgeon: Hilarie Fredrickson, MD;  Location: [facility] ENDOSCOPY;  Service: Endoscopy;  Laterality: N/A;    in for   Chief Complaint  Patient presents with  . Emesis  . Chest Pain  . Shortness of Breath     HPI  [name]  is a 42 y.o. female, history of morbid obesity, diabetes mellitus type 2 insulin-dependent, Chronic diastolic heart failure systolic function has recovered last echo in March of 2014 shows EF of 55%, Liver mass. Being worked up at [facility] under the care of Dr. Deveron Furlong, [name], so far documents indicate that this is a benign lesion for which she is supposed to get an interventional radiologist guided treatment on the 17th of this month. Per patient she has been told this was not malignant. Her  biopsies were reviewed they appear to be inconclusive, chronic diabetic gastroparesis with on-and-off episodes of emesis for the last 6-8 months admitted 1 month ago for this same issue, presents to the hospital with 3 to four-day history of worsening of her baseline nausea vomiting, denies any pain per se but while she is having emesis she has some generalized epigastric discomfort. Denies exposure to any bad foods, no diarrhea, no fever chills, no chest pain or shortness of breath no palpitations. She presented to the ER where workup was suggestive of worsening of her diabetic gastroparesis with nonstop vomiting, dehydration and acute renal failure. I was called to admit the patient.    Review of Systems    In addition to the HPI above,   No Fever-chills, No Headache, No changes with Vision or hearing, No problems swallowing food or Liquids, No Chest pain, Cough or Shortness of Breath, No Abdominal pain, ++  Nausea & Vommitting some epigastric abdominal pain while she is vomiting, Bowel movements are regular, No Blood in stool or Urine, No dysuria, No new skin rashes or bruises, No new joints pains-aches,  No new weakness, tingling, numbness in any extremity, No recent weight gain or loss, No polyuria, polydypsia or polyphagia, No significant Mental Stressors.  A full 10 point Review of Systems was done, except as stated above, all other Review of Systems were negative.   Social History History  Substance Use Topics  . Smoking status: Former Smoker -- 3 years    Types: Cigarettes    Quit date: 06/01/1990  . Smokeless tobacco: Never Used  . Alcohol Use: No      Family History Family History  Problem Relation Age of Onset  . Diabetes Mother   . Hypertension Other   . Stroke Other   . Arthritis Other       Prior to Admission medications   Medication Sig Start Date End Date Taking? Authorizing Provider  albuterol (PROAIR HFA) 108 (90 BASE) MCG/ACT inhaler Inhale 2 puffs  into the lungs every 6 (six) hours as needed. For shortness of breath/wheeze    Historical Provider, MD  alendronate (FOSAMAX) 70 MG tablet Take 70 mg by mouth every 7 (seven) days. thursdays. Take with a full glass of water on an empty stomach.    Historical Provider, MD  ALPRAZolam Prudy Feeler) 0.5 MG tablet Take 1 tablet (0.5 mg total) by mouth 3 (three) times daily as needed for sleep. 03/01/13   Stephani Police, PA-C  aspirin EC 81 MG tablet Take 81 mg by mouth daily.    Historical Provider, MD  azaTHIOprine (IMURAN) 50 MG tablet Take 50 mg by mouth 2 (two) times daily.     Historical Provider, MD  buPROPion (WELLBUTRIN SR) 150 MG 12 hr tablet Take 150 mg by mouth 2 (two) times daily.    Historical Provider, MD  carvedilol (COREG) 3.125 MG tablet Take 3.125 mg by mouth 2 (two) times daily with a meal.    Historical Provider, MD  cetirizine (ZYRTEC) 10 MG tablet Take 10 mg by mouth daily.    Historical Provider, MD  citalopram (CELEXA) 20 MG tablet Take 20 mg by mouth every evening.    Historical Provider, MD  clopidogrel (PLAVIX) 75 MG tablet Take 75 mg by mouth daily.    Historical Provider, MD  cycloSPORINE (RESTASIS) 0.05 % ophthalmic emulsion Place 1 drop into both eyes 3 (three) times daily.    Historical Provider, MD  famotidine (PEPCID) 20 MG tablet Take 20 mg by mouth 2 (two) times daily.    Historical Provider, MD  gabapentin (NEURONTIN) 300 MG capsule Take 300 mg by mouth 2 (two) times daily.     Historical Provider, MD  hydroxychloroquine (PLAQUENIL) 200 MG tablet Take 200 mg by mouth 2 (two) times daily.     Historical Provider, MD  insulin aspart (NOVOLOG FLEXPEN) 100 UNIT/ML SOPN FlexPen Inject 3 Units into the skin 3 (three) times daily with meals. In addition to 3 units with each meal add the following sliding scale coverage:  121-150 = 1 unit  151 - 200 = 2 units  201 - 250 = 3 units  251 - 300 = 5 units  301 - 350 = 7 units  351 - 400 = 9 units 03/04/13   Marianne L York, PA-C   isosorbide-hydrALAZINE (BIDIL) 20-37.5 MG per tablet Take 1 tablet by mouth 3 (three) times daily.  Historical Provider, MD  losartan (COZAAR) 100 MG tablet Take 50 mg by mouth daily.     Historical Provider, MD  metoCLOPramide (REGLAN) 10 MG tablet Take 1 tablet (10 mg total) by mouth 4 (four) times daily -  before meals and at bedtime. 03/04/13   Tora Kindred York, PA-C  pantoprazole (PROTONIX) 40 MG tablet Take 1 tablet (40 mg total) by mouth 2 (two) times daily. 03/01/13   Tora Kindred York, PA-C  polyethylene glycol (MIRALAX / GLYCOLAX) packet Take 17 g by mouth 2 (two) times daily. 03/01/13   Tora Kindred York, PA-C  potassium chloride (K-DUR) 10 MEQ tablet Take 1 tablet (10 mEq total) by mouth daily. 03/04/13   Tora Kindred York, PA-C  pravastatin (PRAVACHOL) 10 MG tablet Take 10 mg by mouth at bedtime.      Historical Provider, MD  predniSONE (DELTASONE) 5 MG tablet Take 1 tablet (5 mg total) by mouth daily with breakfast. 02/28/13   Stephani Police, PA-C  senna (SENOKOT) 8.6 MG TABS Take 2 tablets (17.2 mg total) by mouth 2 (two) times daily. 03/01/13   Tora Kindred York, PA-C  torsemide (DEMADEX) 20 MG tablet Take 1 tablet (20 mg total) by mouth daily. 03/04/13   Tora Kindred York, PA-C  traZODone (DESYREL) 100 MG tablet Take 100 mg by mouth at bedtime.    Historical Provider, MD    Allergies  Allergen Reactions  . Oxycodone-Acetaminophen Hives    FYI: no reaction to hydrocodone    Physical Exam  Vitals  Blood pressure 136/104, pulse 94, temperature 98.2 F (36.8 C), temperature source Oral, resp. rate 13, height 5\' 8"  (1.727 m), weight 164.565 kg (362 lb 12.8 oz), SpO2 99.00%.   1. General morbidly obese African American female lying in bed in NAD,     2. Normal affect and insight, Not Suicidal or Homicidal, Awake Alert, Oriented X 3.  3. No F.N deficits, ALL C.Nerves Intact, Strength 5/5 all 4 extremities, Sensation intact all 4 extremities, Plantars down going.  4. Ears and Eyes appear  Normal, Conjunctivae clear, PERRLA. Moist Oral Mucosa. Mild oral thrush.  5. Supple Neck, No JVD, No cervical lymphadenopathy appriciated, No Carotid Bruits.  6. Symmetrical Chest wall movement, Good air movement bilaterally, CTAB.  7. RRR, No Gallops, Rubs or Murmurs, No Parasternal Heave.  8. Positive Bowel Sounds, Abdomen Soft, Non tender, No organomegaly appriciated,No rebound -guarding or rigidity.  9.  No Cyanosis, Normal Skin Turgor, No Skin Rash or Bruise.  10. Good muscle tone,  joints appear normal , no effusions, Normal ROM.  11. No Palpable Lymph Nodes in Neck or Axillae     Data Review  CBC  Recent Labs Lab 03/26/13 0020 03/26/13 0021 04/01/13 1228  WBC  --  14.5* 13.6*  HGB 11.9* 10.2* 10.5*  HCT 35.0* 31.9* 30.6*  PLT  --  357 445*  MCV  --  84.4 80.1  MCH  --  27.0 27.5  MCHC  --  32.0 34.3  RDW  --  14.7 14.5  LYMPHSABS  --  1.3 2.4  MONOABS  --  0.9 1.5*  EOSABS  --  0.1 0.0  BASOSABS  --  0.0 0.0   ------------------------------------------------------------------------------------------------------------------  Chemistries   Recent Labs Lab 03/26/13 0020 03/26/13 0021 04/01/13 1228  NA 139  --  139  K 4.0  --  3.2*  CL 100  --  94*  CO2  --   --  27  GLUCOSE 209*  --  166*  BUN 11  --  23  CREATININE 1.20*  --  2.05*  CALCIUM  --   --  10.1  AST  --  29 23  ALT  --  17 30  ALKPHOS  --  88 99  BILITOT  --  0.5 0.9   ------------------------------------------------------------------------------------------------------------------ estimated creatinine clearance is 58.8 ml/min (by C-G formula based on Cr of 2.05). ------------------------------------------------------------------------------------------------------------------ No results found for this basename: TSH, T4TOTAL, FREET3, T3FREE, THYROIDAB,  in the last 72 hours   Coagulation profile No results found for this basename: INR, PROTIME,  in the last 168  hours ------------------------------------------------------------------------------------------------------------------- No results found for this basename: DDIMER,  in the last 72 hours -------------------------------------------------------------------------------------------------------------------  Cardiac Enzymes No results found for this basename: CK, CKMB, TROPONINI, MYOGLOBIN,  in the last 168 hours ------------------------------------------------------------------------------------------------------------------ No components found with this basename: POCBNP,    ---------------------------------------------------------------------------------------------------------------  Urinalysis    Component Value Date/Time   COLORURINE YELLOW 02/20/2013 2001   APPEARANCEUR CLOUDY* 02/20/2013 2001   LABSPEC 1.017 02/20/2013 2001   PHURINE 6.0 02/20/2013 2001   GLUCOSEU NEGATIVE 02/20/2013 2001   HGBUR LARGE* 02/20/2013 2001   BILIRUBINUR NEGATIVE 02/20/2013 2001   KETONESUR NEGATIVE 02/20/2013 2001   PROTEINUR NEGATIVE 02/20/2013 2001   UROBILINOGEN 0.2 02/20/2013 2001   NITRITE NEGATIVE 02/20/2013 2001   LEUKOCYTESUR LARGE* 02/20/2013 2001    ----------------------------------------------------------------------------------------------------------------  Imaging results:   Dg Chest 2 View  04/01/2013   *RADIOLOGY REPORT*  Clinical Data: Emesis, chest pain, shortness of breath  CHEST - 2 VIEW  Comparison: 02/26/2013  Findings: Mild cardiomegaly.  Vascular pattern normal.  Lungs clear.  IMPRESSION: No acute findings.   Original Report Authenticated By: Esperanza Heir, M.D.   Ct Abdomen Pelvis W Contrast  03/26/2013   *RADIOLOGY REPORT*  Clinical Data: Abdominal pain, recent liver biopsy  CT ABDOMEN AND PELVIS WITH CONTRAST  Technique:  Multidetector CT imaging of the abdomen and pelvis was performed following the standard protocol during bolus administration of intravenous contrast.  Contrast:   100 ml Omnipaque-300  Comparison: 02/20/2013 CT, 02/24/2013 MRI  Findings: Heart size upper normal to mildly enlarged.  Mild linear lung base opacities, favor atelectasis.  Hepatic steatosis.  Large segment 4/5 mass. Increased high attenuation along the posterior margin of the mass and apparent intralesional fluid fluid level may reflect intralesional hemorrhage. Heterogeneous enhancement of the inferior right hepatic lobe is nonspecific.  Unremarkable spleen, pancreas, adrenal glands, kidneys.  No hydroureteronephrosis.  Decompressed colon limits evaluation.  No overt colitis.  No bowel obstruction.  Normal appendix.  Rounded high density along the umbilicus is similar, measuring 3.2 cm.  Trace free fluid within the pelvis.  No free intraperitoneal air.  Small hiatal hernia.  No lymphadenopathy.  Normal caliber aorta and branch vessels without aneurysmal dilatation.  Decompressed bladder.  Unremarkable CT appearance to the uterus. No adnexal mass.  No acute osseous finding.  IMPRESSION: Post biopsy with high attenuation within the mass, may reflect intralesional hemorrhage.  No definite intraperitoneal bleed.  Wedge-shaped areas of hypoattenuation along the inferior margin of the right hepatic lobe are nonspecific. May reflect variant perfusion or infarctions.   Original Report Authenticated By: Jearld Lesch, M.D.      EKG - NSR, some ST change in Inf lat leads, slightly different from previous EKG     Assessment & Plan   1. Intractable nausea vomiting secondary to diabetic gastroparesis causing dehydration and acute renal failure - will be admitted to a MedSurg bed, for  now she will be kept n.p.o., will obtain an acute abdominal series to rule out bowel obstruction, he has stable lipase level, however patient says that her symptoms are typical of her diabetic gastroparesis. We'll place on scheduled IV Reglan, bowel rest, IV fluids and monitor. She has mild oral thrush I will place her on IV  Diflucan.    2. Acute renal failure due to #1 above. Obtain urine electrolytes, IV fluids, hold diuretics and ACE inhibitor. Repeat BMP in the morning.     3. Liver mass. Being worked up at [facility] under the care of Dr. Deveron Furlong, [name], so far documents indicate that this is a benign lesion for which she is supposed to get an interventional radiologist guided treatment on the 17th of this month. Per patient she has been told this was not malignant. Her biopsies were reviewed they appear to be inconclusive.     4.H/O Chronic diastolic and systolic heart failure last echogram shows an EF of  55% in March of 2014. Therefore I doubt there is any systolic element to it, she is compensated from the standpoint. For now she is requiring IV fluids. Home dose ACE inhibitor and diuretic held. Her beta blocker will be continued.    5. Diabetes mellitus type 2. Home insulin regimen, every 6 hour Accu-Cheks with sliding scale, check A1c.     6.History of SLE and Sjogren's syndrome. Stable home medications to be continued unchanged. DVT Prophylaxis Heparin       7. GERD she will be placed on IV PPI.     8. Hypokalemia replaced through the IV. Repeat BMP in the morning with magnesium levels.     9. Leukocytosis reactionary due to nausea vomiting. Obtain UA, chest x-ray and monitor.     10.Non Specific EKG changes - acute on Chr, no chest pain, Trop negative, cycle troponin, continue ASA-Plavix, B blocker and statin.       AM Labs Ordered, also please review Full Orders  Family Communication: Admission, patients condition and plan of care including tests being ordered have been discussed with the patient   who indicates understanding and agree with the plan and Code Status.  Code Status full  Likely DC to home  Condition fair  Time spent in minutes : 45    Susa Raring K M.D on 04/01/2013 at 2:27 PM  Between 7am to 7pm - Pager - [phone]  After  7pm go to [email or web address] - password [email or web address]  And look for the night coverage person covering me after hours  [facility] Office  [phone]

## 2013-04-01 NOTE — ED Notes (Signed)
Admitting at bedside 

## 2013-04-02 DIAGNOSIS — R109 Unspecified abdominal pain: Secondary | ICD-10-CM

## 2013-04-02 LAB — URINALYSIS W MICROSCOPIC + REFLEX CULTURE
Glucose, UA: NEGATIVE mg/dL
Hgb urine dipstick: NEGATIVE
Nitrite: NEGATIVE
Specific Gravity, Urine: 1.029 (ref 1.005–1.030)
pH: 5.5 (ref 5.0–8.0)

## 2013-04-02 LAB — BASIC METABOLIC PANEL
BUN: 24 mg/dL — ABNORMAL HIGH (ref 6–23)
CO2: 30 mEq/L (ref 19–32)
Chloride: 100 mEq/L (ref 96–112)
Creatinine, Ser: 1.73 mg/dL — ABNORMAL HIGH (ref 0.50–1.10)

## 2013-04-02 LAB — CBC
Hemoglobin: 8.9 g/dL — ABNORMAL LOW (ref 12.0–15.0)
MCH: 26.6 pg (ref 26.0–34.0)
MCHC: 32.1 g/dL (ref 30.0–36.0)
MCV: 82.7 fL (ref 78.0–100.0)
RBC: 3.35 MIL/uL — ABNORMAL LOW (ref 3.87–5.11)

## 2013-04-02 LAB — GLUCOSE, CAPILLARY: Glucose-Capillary: 143 mg/dL — ABNORMAL HIGH (ref 70–99)

## 2013-04-02 LAB — CREATININE, URINE, RANDOM: Creatinine, Urine: 490.65 mg/dL

## 2013-04-02 LAB — SODIUM, URINE, RANDOM: Sodium, Ur: 58 mEq/L
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
## 2013-04-02 NOTE — Progress Notes (Signed)
INITIAL NUTRITION ASSESSMENT  DOCUMENTATION CODES Per approved criteria  -Morbid Obesity   INTERVENTION: RD provided "Nutrition Therapy for Gastroparesis" handout during this visit. RD to follow along to assess need for nutrition interventions throughout diet advancement.  NUTRITION DIAGNOSIS: Inadequate oral intake related to GI distress as evidenced by dietary recall and weight loss.   Goal: Advance diet as tolerated. Intake to meet at least 90% of estimated needs.  Monitor:  weight trends, lab trends, I/O's, diet advancement, oral intake  Reason for Assessment: Malnutrition Screening Tool  42 y.o. female  Admitting Dx: ARF (acute renal failure)  ASSESSMENT: PMHx significant for morbid obesity, DM2 with gastroparesis, HF and liver mass (work-up ongoing, benign at this time, per patient.) Admitted with 3-4 day hx of n/v and epigastric discomfort. Work-up reveals gastroparesis with secondary ARF and dehydration.  Per chart review, pt was followed by the outpatient R[facility] in the summer of 2013 for obesity, blood sugar management, and low-sodium nutrition therapy. Notes reviewed.  Currently on a clear liquid diet. She states that she is tolerating well at this time. Pt with 9% wt loss x 1 month - this is significant for this time frame. Pt reports that her oral intake was poor during the past 3-4 days. She states that she tries to follow a Heart Healthy diet - especially limiting her sodium intake.  Pt with no knowledge of gastroparesis, per her report. RD provided education re: nutrition management of gastroparesis during this visit.  Pt at nutrition risk given recent significant wt loss and chronic medical issues.  Height: Ht Readings from Last 1 Encounters:  04/01/13 5\' 8"  (1.727 m)    Weight: Wt Readings from Last 1 Encounters:  04/02/13 364 lb 6.7 oz (165.3 kg)    Ideal Body Weight: 154 lb  % Ideal Body Weight:  236%  Wt Readings from Last 10 Encounters:  04/02/13 364 lb 6.7 oz (165.3 kg)  03/11/13 383 lb (173.728 kg)  03/04/13 399 lb 14.6 oz (181.4 kg)  03/04/13 399 lb 14.6 oz (181.4 kg)  02/20/13 391 lb 8.6 oz (177.6 kg)  02/18/13 397 lb (180.078 kg)  11/19/12 409 lb (185.521 kg)  10/09/12 407 lb 13.6 oz (185 kg)  08/10/12 429 lb (194.593 kg)  05/08/12 409 lb (185.521 kg)    Usual Body Weight: 400 - 410 lb  % Usual Body Weight: 91%  BMI:  Body mass index is 55.42 kg/(m^2). Obese Class III  Estimated Nutritional Needs: Kcal: 1700 - 2000 Protein: 80 - 100 g  Fluid: 1.7 - 2 liters daily  Skin: intact  Diet Order: Clear Liquid  EDUCATION NEEDS: -Education needs addressed   Intake/Output Summary (Last 24 hours) at 04/02/13 1032 Last data filed at 04/01/13 2000  Gross per 24 hour  Intake 695.83 ml  Output      0 ml  Net 695.83 ml    Last BM: 9/1   Labs:   Recent Labs Lab 04/01/13 1228 04/01/13 1719 04/02/13 0710  NA 139  --  141  K 3.2*  --  3.8  CL 94*  --  100  CO2 27  --  30  BUN 23  --  24*  CREATININE 2.05* 2.09* 1.73*  CALCIUM 10.1  --  9.6  MG  --   --  2.3  GLUCOSE 166*  --  124*    CBG (last 3)   Recent Labs  04/01/13 1808 04/02/13 0002 04/02/13 0641  GLUCAP  123* 130* 143*    Scheduled Meds: . aspirin EC  81 mg Oral Daily  . azaTHIOprine  50 mg Oral BID  . buPROPion  150 mg Oral BID  . carvedilol  3.125 mg Oral BID WC  . citalopram  20 mg Oral QPM  . clopidogrel  75 mg Oral Daily  . cycloSPORINE  1 drop Both Eyes TID  . fluconazole (DIFLUCAN) IV  100 mg Intravenous Q24H  . gabapentin  300 mg Oral BID  . heparin  5,000 Units Subcutaneous Q8H  . hydroxychloroquine  200 mg Oral BID  . insulin aspart  0-9 Units Subcutaneous Q6H  . insulin aspart  3 Units Subcutaneous TID WC  . isosorbide-hydrALAZINE  1 tablet Oral TID  . loratadine  10 mg Oral Daily  . metoCLOPramide (REGLAN) injection  10 mg Intravenous Q6H  . pantoprazole (PROTONIX)  IV  40 mg Intravenous Q12H  . polyethylene glycol  17 g Oral BID  . potassium chloride  10 mEq Oral Daily  . pravastatin  10 mg Oral QPC supper  . predniSONE  5 mg Oral Q breakfast  . senna  2 tablet Oral BID  . traZODone  100 mg Oral QHS    Continuous Infusions: . 0.9 % NaCl with KCl 20 mEq / L 125 mL/hr (04/01/13 2351)    Past Medical History  Diagnosis Date  . Bell's palsy 08/09/2010  . SLE 08/09/2010  . SJOGREN'S SYNDROME 08/09/2010  . Hypertension   . High cholesterol   . Anginal pain   . Obstructive sleep apnea on CPAP 2011  . Anxiety   . GERD (gastroesophageal reflux disease)   . CHF (congestive heart failure)   . Acute renal failure      Intractable nausea vomiting secondary to diabetic gastroparesis causing dehydration and acute renal failure /notes 04/01/2013  . Liver mass     Hattie Perch 04/01/2013  . Pneumonia 2000's    "once" (04/01/2013)  . Exertional shortness of breath   . On home oxygen therapy     "2L 24/7" (04/01/2013)  . Diabetic gastroparesis associated with type 2 diabetes mellitus     Hattie Perch 04/01/2013  . Migraines     "maybe a couple times/yr" (04/01/2013)  . Stroke 2010; 10/2012    "left side is still weak from it, never fully regained full strength; no additions from stroke 10/2012"    Past Surgical History  Procedure Laterality Date  . Ectopic pregnancy surgery  1999  . Cardiac catheterization  02/07/12  . Hernia repair  1980's    umbilical  . Epidermoid cyst excision  2007     left breast  . Esophagogastroduodenoscopy N/A 02/26/2013    Procedure: ESOPHAGOGASTRODUODENOSCOPY (EGD);  Surgeon: Hilarie Fredrickson, MD;  Location: [facility] ENDOSCOPY;  Service: Endoscopy;  Laterality: N/A;  . Breast cyst excision Left 2007    epidermoid    Jarold Motto MS, RD, LDN Pager: [phone] After-hours pager: [phone]

## 2013-04-03 ENCOUNTER — Encounter (HOSPITAL_COMMUNITY): Payer: Self-pay | Admitting: Physician Assistant

## 2013-04-03 DIAGNOSIS — I10 Essential (primary) hypertension: Secondary | ICD-10-CM

## 2013-04-03 DIAGNOSIS — R1115 Cyclical vomiting syndrome unrelated to migraine: Secondary | ICD-10-CM

## 2013-04-03 LAB — URINE CULTURE

## 2013-04-03 LAB — BASIC METABOLIC PANEL
BUN: 20 mg/dL (ref 6–23)
Calcium: 9.9 mg/dL (ref 8.4–10.5)
GFR calc non Af Amer: 44 mL/min — ABNORMAL LOW (ref >90)
Glucose, Bld: 148 mg/dL — ABNORMAL HIGH (ref 70–99)

## 2013-04-03 LAB — GLUCOSE, CAPILLARY: Glucose-Capillary: 165 mg/dL — ABNORMAL HIGH (ref 70–99)

## 2013-04-03 MED ORDER — PROMETHAZINE HCL 25 MG/ML IJ SOLN
25.0000 mg | Freq: Four times a day (QID) | INTRAMUSCULAR | Status: DC | PRN
Start: 2013-04-03 — End: 2013-04-06
  Administered 2013-04-03: 25 mg via INTRAVENOUS
  Filled 2013-04-03: qty 1

## 2013-04-03 MED ORDER — PROMETHAZINE HCL 25 MG/ML IJ SOLN: 12.5000 mg | Freq: Four times a day (QID) | INTRAMUSCULAR | Status: DC | PRN

## 2013-04-03 MED ORDER — DEXTROSE 5 % IV SOLN
1.0000 g | INTRAVENOUS | Status: DC
  Administered 2013-04-03 – 2013-04-05 (×3): 1 g via INTRAVENOUS
  Filled 2013-04-03 (×5): qty 10

## 2013-04-03 MED ORDER — BUPROPION HCL ER (SR) 100 MG PO TB12
100.0000 mg | ORAL_TABLET | Freq: Two times a day (BID) | ORAL | Status: DC
  Filled 2013-04-03 (×7): qty 1

## 2013-04-03 MED ORDER — INSULIN ASPART 100 UNIT/ML ~~LOC~~ SOLN
0.0000 [IU] | Freq: Three times a day (TID) | SUBCUTANEOUS | Status: DC
  Administered 2013-04-03 – 2013-04-04 (×3): 1 [IU] via SUBCUTANEOUS
  Administered 2013-04-04: 3 [IU] via SUBCUTANEOUS
  Administered 2013-04-04: 2 [IU] via SUBCUTANEOUS
  Administered 2013-04-05: 1 [IU] via SUBCUTANEOUS
  Administered 2013-04-05: 3 [IU] via SUBCUTANEOUS
  Administered 2013-04-06: 2 [IU] via SUBCUTANEOUS

## 2013-04-03 MED ORDER — TRAZODONE HCL 50 MG PO TABS
50.0000 mg | ORAL_TABLET | Freq: Every day | ORAL | Status: DC
  Administered 2013-04-05: 50 mg via ORAL
  Filled 2013-04-03 (×3): qty 1

## 2013-04-03 MED ORDER — CITALOPRAM HYDROBROMIDE 10 MG PO TABS
10.0000 mg | ORAL_TABLET | Freq: Every evening | ORAL | Status: DC
  Administered 2013-04-05: 10 mg via ORAL
  Filled 2013-04-03 (×5): qty 1

## 2013-04-03 MED ORDER — FLUCONAZOLE 100MG IVPB: 100.0000 mg | INTRAVENOUS | Status: DC

## 2013-04-03 MED ORDER — SODIUM CHLORIDE 0.9 % IV SOLN
INTRAVENOUS | Status: DC
  Administered 2013-04-03 – 2013-04-06 (×3): via INTRAVENOUS

## 2013-04-03 MED ORDER — HYDROMORPHONE HCL PF 1 MG/ML IJ SOLN
0.5000 mg | INTRAMUSCULAR | Status: DC | PRN
  Administered 2013-04-03 – 2013-04-06 (×9): 0.5 mg via INTRAVENOUS
  Filled 2013-04-03 (×9): qty 1

## 2013-04-03 MED ORDER — HYDROCORTISONE SOD SUCCINATE 100 MG IJ SOLR
100.0000 mg | Freq: Three times a day (TID) | INTRAMUSCULAR | Status: AC
  Administered 2013-04-03 – 2013-04-04 (×3): 100 mg via INTRAVENOUS
  Filled 2013-04-03 (×5): qty 2

## 2013-04-03 NOTE — Consult Note (Signed)
[facility] Consult: 2:58 PM 04/03/2013   Referring Provider: Dr Sunnie Nielsen  Primary Care Physician:  Ron Parker, MD Primary Gastroenterologist:  Dr.Perry saw her during 01/2013 admission.    Reason for Consultation:  Recalcitrant n/v.   HPI: [name] is a 42 y.o. female.  Obese pt (367#) with type 2 DM, sjogren's syndrome/polymyositis( on Plaqenil and  Imuran). Chronic plavix.   On CT 01/2013 had 5.9 x 7.7 cm right hepatic mass.  It was 7.1 x 7.6 x 6.4 CM by MRI.  AFP of 5. Liver biopsy of liver mass 03/25/18 at [facility].  So far pathology looks to be benign.  She will undergo radiologic ablation of the liver lesion and has appts with surgeon and interventional radiologist set up in next several weeks.  Had EGD for n/v/abdominal pain by Dr Marina Goodell on 02/26/13 showing esophagitis andno retained gastric contents.  She has been labelled as having diabetic gastroparesis, but no emptying studies ever done.    Continues to vomit several times daily and have pain in epigastrium into RUQ.  She feels the pain comes from the continuous, vigorous retching.  Emesis is yellow and frothy/clear as well.  No CG or bloody emesis.  No pain radation. Some chills, no sweats, no fevers. GI meds include Pepcid.  Though listed, she does not take the BID Protonix. She takes 81 mg ASA daily.   Neither Reglan, zofran or phenergen have alleviated the nausea which is progressively worsening. She is vomiting her meds and has recently not been able to swallow even 2 oz of fluids.  She says several meds were started around the time of onset of GI sxs.   These include Bidil, Celexa, Wellbutrin, trazodone.  She says her mood has not changed with use of the antidepressants and that her BP has never been hard to control.   He insulin was d/c'd in late July 2014.  Sugars are running in the 1teens (112. 118) at home.  Her A1C of 5.6 last month supports good control of blood  sugars.   Her weight has gone from 177 # to 166 # in the last month.  Admitted yesterday with ongoing, progressive sxs associated with acute renal failure, dehydration.   Last BM had been on 8/25 until she had a large BM this AM even though she vomited up the miralax and senekot.  Normally was having daily BM.        Past Medical History  Diagnosis Date  . Bell's palsy 08/09/2010  . SLE 08/09/2010  . SJOGREN'S SYNDROME 08/09/2010  . Hypertension   . High cholesterol   . Anginal pain   . Obstructive sleep apnea on CPAP 2011  . Anxiety   . GERD (gastroesophageal reflux disease)   . CHF (congestive heart failure)   . Acute renal failure      Intractable nausea vomiting secondary to diabetic gastroparesis causing dehydration and acute renal failure /notes 04/01/2013  . Liver mass     biopsied 03/2013 at [facility], not malignant.  is to undergo radiologic ablation of the mass in sept/October 2014.   Marland Kitchen Pneumonia 2000's    "once" (04/01/2013)  . On home oxygen therapy     "2L 24/7" (04/01/2013)  . Diabetic gastroparesis associated with type 2 diabetes mellitus     this is presumed diagnoses, not confirmed by any studies.   . Migraines     "maybe a couple times/yr" (04/01/2013)  . Stroke 2010; 10/2012    "left side is  still weak from it, never fully regained full strength; no additions from stroke 10/2012"    Past Surgical History  Procedure Laterality Date  . Ectopic pregnancy surgery  1999  . Cardiac catheterization  02/07/12  . Hernia repair  1980's    umbilical  . Epidermoid cyst excision  2007     left breast  . Esophagogastroduodenoscopy N/A 02/26/2013    Procedure: ESOPHAGOGASTRODUODENOSCOPY (EGD);  Surgeon: Hilarie Fredrickson, MD;  Location: [facility] ENDOSCOPY;  Service: Endoscopy;  Laterality: N/A;  . Breast cyst excision Left 2007    epidermoid    Prior to Admission medications   Medication Sig Start Date End Date Taking? Authorizing Provider  ALPRAZolam Prudy Feeler) 0.5 MG tablet Take 0.5  mg by mouth 3 (three) times daily as needed for sleep or anxiety.   Yes Historical Provider, MD  aspirin EC 81 MG tablet Take 81 mg by mouth daily.   Yes Historical Provider, MD  azaTHIOprine (IMURAN) 50 MG tablet Take 50 mg by mouth 2 (two) times daily.    Yes Historical Provider, MD  buPROPion (WELLBUTRIN SR) 150 MG 12 hr tablet Take 150 mg by mouth 2 (two) times daily.   Yes Historical Provider, MD  carvedilol (COREG) 3.125 MG tablet Take 3.125 mg by mouth 2 (two) times daily with a meal.   Yes Historical Provider, MD  cetirizine (ZYRTEC) 10 MG tablet Take 10 mg by mouth daily.   Yes Historical Provider, MD  citalopram (CELEXA) 20 MG tablet Take 20 mg by mouth every evening.   Yes Historical Provider, MD  clopidogrel (PLAVIX) 75 MG tablet Take 75 mg by mouth daily.   Yes Historical Provider, MD  cycloSPORINE (RESTASIS) 0.05 % ophthalmic emulsion Place 1 drop into both eyes 3 (three) times daily.   Yes Historical Provider, MD  famotidine (PEPCID) 20 MG tablet Take 20 mg by mouth 2 (two) times daily.   Yes Historical Provider, MD  gabapentin (NEURONTIN) 300 MG capsule Take 300 mg by mouth 2 (two) times daily.    Yes Historical Provider, MD  hydroxychloroquine (PLAQUENIL) 200 MG tablet Take 200 mg by mouth 2 (two) times daily.    Yes Historical Provider, MD  Insulin Aspart (NOVOLOG FLEXPEN [place]) Inject 7 Units into the skin 3 (three) times daily before meals.   Yes Historical Provider, MD  isosorbide-hydrALAZINE (BIDIL) 20-37.5 MG per tablet Take 1 tablet by mouth 3 (three) times daily.   Yes Historical Provider, MD  LOSARTAN POTASSIUM PO Take 1 tablet by mouth daily.   Yes Historical Provider, MD  metoCLOPramide (REGLAN) 10 MG tablet Take 1 tablet (10 mg total) by mouth 4 (four) times daily -  before meals and at bedtime. 03/04/13  Yes Tora Kindred York, PA-C         polyethylene glycol (MIRALAX / GLYCOLAX) packet Take 17 g by mouth 2 (two) times daily. 03/01/13  Yes Marianne L York, PA-C  potassium  chloride (K-DUR) 10 MEQ tablet Take 10 mEq by mouth 3 (three) times daily.   Yes Historical Provider, MD  pravastatin (PRAVACHOL) 10 MG tablet Take 10 mg by mouth at bedtime.     Yes Historical Provider, MD  predniSONE (DELTASONE) 5 MG tablet Take 1 tablet (5 mg total) by mouth daily with breakfast. 02/28/13  Yes Tora Kindred York, PA-C  senna (SENOKOT) 8.6 MG TABS Take 2 tablets (17.2 mg total) by mouth 2 (two) times daily. 03/01/13  Yes Marianne L York, PA-C  torsemide (DEMADEX) 20 MG tablet Take 20  mg by mouth 3 (three) times daily.   Yes Historical Provider, MD  traZODone (DESYREL) 100 MG tablet Take 100 mg by mouth at bedtime.   Yes Historical Provider, MD  albuterol (PROAIR HFA) 108 (90 BASE) MCG/ACT inhaler Inhale 2 puffs into the lungs every 6 (six) hours as needed. For shortness of breath/wheeze    Historical Provider, MD  alendronate (FOSAMAX) 70 MG tablet Take 70 mg by mouth every 7 (seven) days. thursdays. Take with a full glass of water on an empty stomach.    Historical Provider, MD    Scheduled Meds: . aspirin EC  81 mg Oral Daily  . azaTHIOprine  50 mg Oral BID  . buPROPion  100 mg Oral BID  . carvedilol  3.125 mg Oral BID WC  . cefTRIAXone (ROCEPHIN)  IV  1 g Intravenous Q24H  . citalopram  10 mg Oral QPM  . clopidogrel  75 mg Oral Daily  . cycloSPORINE  1 drop Both Eyes TID  . fluconazole (DIFLUCAN) IV  100 mg Intravenous Q24H  . gabapentin  300 mg Oral BID  . heparin  5,000 Units Subcutaneous Q8H  . hydroxychloroquine  200 mg Oral BID  . insulin aspart  0-9 Units Subcutaneous TID WC  . isosorbide-hydrALAZINE  1 tablet Oral TID  . loratadine  10 mg Oral Daily  . pantoprazole (PROTONIX) IV  40 mg Intravenous Q12H  . polyethylene glycol  17 g Oral BID  . pravastatin  10 mg Oral QPC supper  . predniSONE  5 mg Oral Q breakfast  . senna  2 tablet Oral BID  . traZODone  50 mg Oral QHS   Infusions: . sodium chloride 50 mL/hr at 04/03/13 1255   PRN Meds: albuterol,  ALPRAZolam, alum & mag hydroxide-simeth, guaiFENesin-dextromethorphan, magnesium hydroxide, morphine injection, ondansetron (ZOFRAN) IV, ondansetron   Allergies as of 04/01/2013 - Review Complete 04/01/2013  Allergen Reaction Noted  . Oxycodone-acetaminophen Hives 08/09/2010    Family History  Problem Relation Age of Onset  . Diabetes Mother   . Hypertension Other   . Stroke Other   . Arthritis Other        Stomach cancer in mat aunt and her brother.       Colon cancer in mat aunt.   History   Social History  . Marital Status: Single    Spouse Name: N/A    Number of Children: N/A  . Years of Education: N/A   Occupational History  .      student   Social History Main Topics  . Smoking status: Former Smoker -- 3 years    Types: Cigarettes    Quit date: 06/01/1990  . Smokeless tobacco: Never Used  . Alcohol Use: No  . Drug Use: No  . Sexual Activity: Not Currently   Other Topics Concern  . Not on file   Social History Narrative   Regular exercise-yes    REVIEW OF SYSTEMS: Unable to exercise. Uses rolling walker since stroke in 2011. Chronic, stable, baseline DOE.  No dysuria, no bloody nose, no bleeding problems. No NSAIDs. Uses prn Tylenol for frequent headaches.  No rash, itching or skin sores At home sugars range from low 100s to 120s, and A1C is 5.6.  She was switched from Lantus insulin to Humalog this past spring and has been better about her diet. Wt has fluctuated in 10 # range, no stable weight loss. Daily brown BMs.  Dry mouth.  All other ROS negative or as per  HPI   PHYSICAL EXAM: Vital signs in last 24 hours: Temp:  [98.2 F (36.8 C)-98.5 F (36.9 C)] 98.2 F (36.8 C) (09/03 0548) Pulse Rate:  [75-79] 77 (09/03 0548) Resp:  [16-20] 16 (09/03 0548) BP: (128-142)/(74-79) 128/79 mmHg (09/03 0548) SpO2:  [96 %-99 %] 96 % (09/03 0548) Weight:  166.5 kg, 367 lb 1.1 oz.   Was 177.3/391 on 7/29  General: pleasant, uncomfortable, morbidly  obese Head:  Normocephalic, atraumatic  Eyes:  No icterus or pallor Ears:  Not HOH  Nose:  No discharge Mouth:  Clear, moist oral mucosa.  Neck:  No mass or JVD.  Lungs:  Clear but diminished bilaterally. Some chest wall tenderness Heart: RRR.  No MRG Abdomen:  Soft, obese, tender without guard or rebound in epigastrium. .   Rectal: deferred   Musc/Skeltl: no joint contracture. Extremities:  No pedal edema  Neurologic:  Pleasant, oriented x 3.  Some restless leg movement Skin:  No sores, no rash Tattoos:  none Nodes:  No cervical adenopathy.    Psych:  Flat affect, not agitated.   Intake/Output from previous day: 09/02 0701 - 09/03 0700 In: 240 [P.O.:240] Out: 350 [Urine:350] Intake/Output this shift:    LAB RESULTS:  Recent Labs  04/01/13 1228 04/01/13 1719 04/02/13 0710  WBC 13.6* 13.0* 11.7*  HGB 10.5* 9.6* 8.9*  HCT 30.6* 29.2* 27.7*  PLT 445* 424* 405*   BMET Lab Results  Component Value Date   NA 145 04/03/2013   NA 141 04/02/2013   NA 139 04/01/2013   K 3.6 04/03/2013   K 3.8 04/02/2013   K 3.2* 04/01/2013   CL 103 04/03/2013   CL 100 04/02/2013   CL 94* 04/01/2013   CO2 30 04/03/2013   CO2 30 04/02/2013   CO2 27 04/01/2013   GLUCOSE 148* 04/03/2013   GLUCOSE 124* 04/02/2013   GLUCOSE 166* 04/01/2013   BUN 20 04/03/2013   BUN 24* 04/02/2013   BUN 23 04/01/2013   CREATININE 1.45* 04/03/2013   CREATININE 1.73* 04/02/2013   CREATININE 2.09* 04/01/2013   CALCIUM 9.9 04/03/2013   CALCIUM 9.6 04/02/2013   CALCIUM 10.1 04/01/2013   LFT  Recent Labs  04/01/13 1228  PROT 8.8*  ALBUMIN 3.6  AST 23  ALT 30  ALKPHOS 99  BILITOT 0.9  BILIDIR 0.2  IBILI 0.7   PT/INR Lab Results  Component Value Date   INR 1.11 04/01/2013   INR 1.10 02/28/2013   INR 1.05 02/26/2013   Hepatitis Panel No results found for this basename: HEPBSAG, HCVAB, HEPAIGM, HEPBIGM,  in the last 72 hours C-Diff No components found with this basename: cdiff    Drugs of Abuse  No results found for this basename:  labopia,  cocainscrnur,  labbenz,  amphetmu,  thcu,  labbarb     RADIOLOGY STUDIES: Dg Chest 2 View 04/01/2013     Findings: Mild cardiomegaly.  Vascular pattern normal.  Lungs clear.  IMPRESSION: No acute findings.   Original Report Authenticated By: Esperanza Heir, M.D.   Dg Abd 2 Views 04/01/2013  .  Findings: No free air.  Primarily gasless abdomen which by definition is nonspecific, although there is a small amount of gas into hepatic flexure of colon.  There is also a small amount of gas in the stomach and minimal gas into the splenic flexure of colon.  IMPRESSION: A technically nonspecific gas pattern as obstruction can rarely present with a gasless pattern.  However, there is a small amount of  gas present and the findings are most likely within normal limits.   Original Report Authenticated By: Esperanza Heir, M.D   CT Abdomen and Pelvis 03/26/13.  Findings: Heart size upper normal to mildly enlarged. Mild linear  lung base opacities, favor atelectasis.  Hepatic steatosis. Large segment 4/5 mass. Increased high  attenuation along the posterior margin of the mass and apparent  intralesional fluid fluid level may reflect intralesional  hemorrhage. Heterogeneous enhancement of the inferior right hepatic  lobe is nonspecific.  Unremarkable spleen, pancreas, adrenal glands, kidneys. No  hydroureteronephrosis.  Decompressed colon limits evaluation. No overt colitis. No bowel  obstruction. Normal appendix. Rounded high density along the  umbilicus is similar, measuring 3.2 cm. Trace free fluid within  the pelvis. No free intraperitoneal air. Small hiatal hernia. No  lymphadenopathy.  Normal caliber aorta and branch vessels without aneurysmal  dilatation.  Decompressed bladder. Unremarkable CT appearance to the uterus.  No adnexal mass.  No acute osseous finding.  IMPRESSION:  Post biopsy with high attenuation within the mass, may reflect  intralesional hemorrhage. No definite  intraperitoneal bleed.  Wedge-shaped areas of hypoattenuation along the inferior margin of  the right hepatic lobe are nonspecific. May reflect variant  perfusion or infarctions.  03/25/18 Liver biopsy/FNA Pathology of liver biopsy, FNA Liver mass, Cytology core biopsy: Liver parenchyma and portions of loose fibro-connectivetissue. See comment.  Specimen Adequacy: Satisfactory for evaluation.  Comment: The core biopsy shows fragments of hepatic parenchyma with sheets of cytologically bland hepatocytes. Separate tissue portions are composed of loose fibroconnective tissue with mild inflammatory component (including lymphocytes and rare eosinophils). One bile duct is appreciated near a large blood vessel but the sheets of hepatocytes appear to lack bile ductules. Given the clinical history of a 6-7 cm mass the two entities which need to be considered in the differential diagnosis include hepatocellular adenoma (favored) and focal nodular hyperplasia. Hepatocellular carcinoma is much less likely. Correlation with clinical and imaging findings is recommended.   ENDOSCOPIC STUDIES 02/26/13 EGD, diagnostic ENDOSCOPIC IMPRESSION:  1. Reflux esophagitis  RECOMMENDATIONS:  1. PPI bid  2. Gastric Emptying Scan .  3. HIDA scan   IMPRESSION: * Several months of N/V, presumed diabetic gastroparesis.  * Benign right liver mass.  Biopsy at [facility] 8/25, the pathology did not confirm HCCA or any malignancy.  Set up for radiologic ablation in coming weeks.  * Normocytic anemia. Hgb down 1.2 grams in last 24 hours. Baseline Hgb is ~ 11.5.  * IDDM. Well controlled. A1c 7/21 was 5.6.  * Normocytic anemia. Chronic, baseline Hgb is 10.5 to 11.0  * Morbid obesity. Says she has lost down from 240 or so # to current 390s using diet and excercis.  * OSA. On CPAP at night.  * Hx 09/2012 TIA, 2010 CVA. On plavix, 81 mg ASA.  * Hx CHF, "chronic". LVEF 09/2012 on echo was 55 to 60% "normal systolic  function".  * Sjogren's syndrome, lupus. On Imuran, Plaquenil for many years.   PLAN: *  Regarding the liver mass and recent biopsy, she has ROV with surgeon and interventional radiology at [facility] on 9/26:  Dr Deveron Furlong.  * weight limit for nuc med gantry is 330# at [facility], 360 # at [facility].  Presently I think she would likely vomit the labelled foods if we attempt GES study.  *  I went ahead and cut doses of Celexa, Wellbutrin, Desyrel in half where possible.  I did not adjust dose of Bidil, this med  was also started several months previously around time of onset of the n/v.  If hospitalist is comfortable stopping this or reducing the dose, will leave decision to that MD.     LOS: 2 days   Jennye Moccasin  04/03/2013, 2:58 PM Pager: [phone]    [facility] Attending  I have also seen and assessed the patient and agree with the above note. Cause of nausea and vomiting not clear. We do not have clear evidence of gastroparesis and she does not seem to have neuropathy or any retinopathy. ? If its medications - may be She is stressed about son and her illness. Cortisol level was ok in July but I am going to also try solu-cortef 100 mg IV x 3 to see - if adrenal insufficiency is problem should see big change in sxs. She is also irritable and fidgety - akathesia sxs from metoclopramide - will stop. Will f/u tomorrow.  Iva Boop, MD, [facility] [phone] (pager) 04/03/2013 3:59 PM

## 2013-04-03 NOTE — Progress Notes (Signed)
[facility] PROGRESS NOTE  [name] ZOX:[id number] DOB: 1970/09/17 DOA: 04/01/2013 PCP: Ron Parker, MD  Assessment/Plan:  Nausea, Vomiting, Abdominal pain:  Continue IV hydration. Still with nausea, Zofran and Reglan not helping. I will hold Reglan due to mild contraction of muscle of her face.  She is passing gas, had small BM. I will ask for GI consultation.   Acute kidney injury  Likely prerenal . Hold lasix and ACE inhibitor. Renal function improving, Cr at 1.4.   Oral thrush? Odynophagia:  Continue IV Diflucan  GI consulted.   Liver mass.  Being worked up at [facility] under the care of Dr. Deveron Furlong, [name]. so far documents indicate that this is a benign lesion for which she is supposed to get an interventional radiologist guided treatment on the 17th of this month. Per patient she has been told this was not malignant. Her biopsies were reviewed they appear to be inconclusive.   Chronic diastolic and systolic heart failure  EF of 81% currently euvolemic. Continue beta blocker. Lasix and ACE inhibitor held in neutral acute kidney injury. Continue bidil.   History of SLE and Sjogren's syndrome.  Stable   Diabetes mellitus  Continue with SSI. Marland Kitchen A1C of 6.4  DVT Prophylaxis SQ Heparin     Consultants:  [facility].   Procedures:  none  Antibiotics:  Ceftriaxone 9-3  HPI/Subjective: Still with nausea and vomiting. Reglan, zofran is not helping. She relates mild epigastric pain, and odynophagia.   Objective: Filed Vitals:   04/03/13 0548  BP: 128/79  Pulse: 77  Temp: 98.2 F (36.8 C)  Resp: 16    Intake/Output Summary (Last 24 hours) at 04/03/13 1055 Last data filed at 04/03/13 0900  Gross per 24 hour  Intake    240 ml  Output    350 ml  Net   -110 ml   Filed Weights   04/01/13 1151 04/02/13 0556 04/03/13 0548  Weight: 164.565 kg (362 lb 12.8 oz) 165.3 kg (364 lb 6.7 oz) 166.5 kg (367 lb 1.1 oz)    Exam:   General:   No distress.  Cardiovascular: S 1, S 2 RRR  Respiratory: CTA  Abdomen: Bs present, mild epigastric tenderness, no rigidity  Musculoskeletal: no edema.   Data Reviewed: Basic Metabolic Panel:  Recent Labs Lab 04/01/13 1228 04/01/13 1719 04/02/13 0710 04/03/13 0405  NA 139  --  141 145  K 3.2*  --  3.8 3.6  CL 94*  --  100 103  CO2 27  --  30 30  GLUCOSE 166*  --  124* 148*  BUN 23  --  24* 20  CREATININE 2.05* 2.09* 1.73* 1.45*  CALCIUM 10.1  --  9.6 9.9  MG  --   --  2.3  --    Liver Function Tests:  Recent Labs Lab 04/01/13 1228  AST 23  ALT 30  ALKPHOS 99  BILITOT 0.9  PROT 8.8*  ALBUMIN 3.6    Recent Labs Lab 04/01/13 1228  LIPASE 22   No results found for this basename: AMMONIA,  in the last 168 hours CBC:  Recent Labs Lab 04/01/13 1228 04/01/13 1719 04/02/13 0710  WBC 13.6* 13.0* 11.7*  NEUTROABS 9.7*  --   --   HGB 10.5* 9.6* 8.9*  HCT 30.6* 29.2* 27.7*  MCV 80.1 81.3 82.7  PLT 445* 424* 405*   Cardiac Enzymes:  Recent Labs Lab 04/01/13 1719 04/01/13 2100  TROPONINI <0.30 <0.30   BNP (last 3  results)  Recent Labs  02/20/13 0443 02/25/13 1918 04/01/13 1228  PROBNP 397.9* 162.5* 29.6   CBG:  Recent Labs Lab 04/02/13 0641 04/02/13 1143 04/02/13 1756 04/03/13 0017 04/03/13 0543  GLUCAP 143* 217* 139* 132* 157*    No results found for this or any previous visit (from the past 240 hour(s)).   Studies: Dg Chest 2 View  04/01/2013   *RADIOLOGY REPORT*  Clinical Data: Emesis, chest pain, shortness of breath  CHEST - 2 VIEW  Comparison: 02/26/2013  Findings: Mild cardiomegaly.  Vascular pattern normal.  Lungs clear.  IMPRESSION: No acute findings.   Original Report Authenticated By: Esperanza Heir, M.D.   Dg Abd 2 Views  04/01/2013   *RADIOLOGY REPORT*  Clinical Data: Nausea and diarrhea  ABDOMEN - 2 VIEW  Comparison: None.  Findings: No free air.  Primarily gasless abdomen which by definition is nonspecific, although there  is a small amount of gas into hepatic flexure of colon.  There is also a small amount of gas in the stomach and minimal gas into the splenic flexure of colon.  IMPRESSION: A technically nonspecific gas pattern as obstruction can rarely present with a gasless pattern.  However, there is a small amount of gas present and the findings are most likely within normal limits.   Original Report Authenticated By: Esperanza Heir, M.D.    Scheduled Meds: . aspirin EC  81 mg Oral Daily  . azaTHIOprine  50 mg Oral BID  . buPROPion  150 mg Oral BID  . carvedilol  3.125 mg Oral BID WC  . cefTRIAXone (ROCEPHIN)  IV  1 g Intravenous Q24H  . citalopram  20 mg Oral QPM  . clopidogrel  75 mg Oral Daily  . cycloSPORINE  1 drop Both Eyes TID  . fluconazole (DIFLUCAN) IV  100 mg Intravenous Q24H  . gabapentin  300 mg Oral BID  . heparin  5,000 Units Subcutaneous Q8H  . hydroxychloroquine  200 mg Oral BID  . insulin aspart  0-9 Units Subcutaneous TID WC  . isosorbide-hydrALAZINE  1 tablet Oral TID  . loratadine  10 mg Oral Daily  . pantoprazole (PROTONIX) IV  40 mg Intravenous Q12H  . polyethylene glycol  17 g Oral BID  . pravastatin  10 mg Oral QPC supper  . predniSONE  5 mg Oral Q breakfast  . senna  2 tablet Oral BID  . traZODone  100 mg Oral QHS   Continuous Infusions:   Principal Problem:   ARF (acute renal failure) Active Problems:   IDDM   SLE   SJOGREN'S SYNDROME   DM (diabetes mellitus)   HTN (hypertension)   Systolic CHF, acute on chronic   Chronic diastolic heart failure   Morbid obesity   Hypoglycemia   Intractable vomiting   Abdominal pain, epigastric   Liver mass   Gastroparesis    Time spent: 25 minutes.     [name]  [facility] Pager [phone]. If 7PM-7AM, please contact night-coverage at [email or web address], password [facility] 04/03/2013, 10:55 AM  LOS: 2 days

## 2013-04-04 DIAGNOSIS — R1310 Dysphagia, unspecified: Secondary | ICD-10-CM | POA: Diagnosis present

## 2013-04-04 DIAGNOSIS — D649 Anemia, unspecified: Secondary | ICD-10-CM

## 2013-04-04 LAB — BASIC METABOLIC PANEL
BUN: 14 mg/dL (ref 6–23)
CO2: 28 mEq/L (ref 19–32)
Chloride: 97 mEq/L (ref 96–112)
Creatinine, Ser: 1.22 mg/dL — ABNORMAL HIGH (ref 0.50–1.10)
Glucose, Bld: 144 mg/dL — ABNORMAL HIGH (ref 70–99)

## 2013-04-04 LAB — GLUCOSE, CAPILLARY
Glucose-Capillary: 148 mg/dL — ABNORMAL HIGH (ref 70–99)
Glucose-Capillary: 206 mg/dL — ABNORMAL HIGH (ref 70–99)

## 2013-04-04 LAB — CBC
MCH: 27.1 pg (ref 26.0–34.0)
MCHC: 33.5 g/dL (ref 30.0–36.0)
Platelets: 466 10*3/uL — ABNORMAL HIGH (ref 150–400)
RBC: 3.36 MIL/uL — ABNORMAL LOW (ref 3.87–5.11)

## 2013-04-04 MED ORDER — POTASSIUM CHLORIDE 10 MEQ/100ML IV SOLN
10.0000 meq | INTRAVENOUS | Status: AC
  Administered 2013-04-04 (×3): 10 meq via INTRAVENOUS
  Filled 2013-04-04: qty 300

## 2013-04-04 MED ORDER — PREDNISONE 10 MG PO TABS
10.0000 mg | ORAL_TABLET | Freq: Every day | ORAL | Status: DC
  Filled 2013-04-04: qty 1

## 2013-04-04 MED ORDER — SUCRALFATE 1 GM/10ML PO SUSP
1.0000 g | Freq: Three times a day (TID) | ORAL | Status: DC
  Administered 2013-04-04 – 2013-04-06 (×6): 1 g via ORAL
  Filled 2013-04-04 (×10): qty 10

## 2013-04-04 MED ORDER — PANTOPRAZOLE SODIUM 40 MG PO TBEC
40.0000 mg | DELAYED_RELEASE_TABLET | Freq: Every day | ORAL | Status: DC
  Administered 2013-04-05 – 2013-04-06 (×2): 40 mg via ORAL
  Filled 2013-04-04 (×2): qty 1

## 2013-04-04 NOTE — Progress Notes (Signed)
     [facility] Daily Rounding Note 04/04/2013, 10:07 AM  SUBJECTIVE:       Feels better.  No nausea since she went to sleep last night.  Last BM was yesterday.  Tolerating clears  OBJECTIVE:         Vital signs in last 24 hours:    Temp:  [98.5 F (36.9 C)-99 F (37.2 C)] 99 F (37.2 C) (09/03 2319) Pulse Rate:  [80-81] 80 (09/03 2319) Resp:  [18-20] 18 (09/03 2319) BP: (108-130)/(61-74) 108/61 mmHg (09/03 2319) SpO2:  [98 %-99 %] 98 % (09/03 2319) Last BM Date: 04/01/13 General: looks better   Heart: rrr Chest: clear.  No trouble breathing, no cough. Abdomen: soft, slight epigastric tenderness.  Active BS.  obese  Extremities: no pedal edema Neuro/Psych:  Pleasant.  Less depressed, brighter affect.  No agitation, no akathisia   Intake/Output from previous day: 09/03 0701 - 09/04 0700 In: 240 [P.O.:240] Out: -   Intake/Output this shift: Total I/O In: 240 [P.O.:240] Out: -   Lab Results:  Recent Labs  04/01/13 1228 04/01/13 1719 04/02/13 0710  WBC 13.6* 13.0* 11.7*  HGB 10.5* 9.6* 8.9*  HCT 30.6* 29.2* 27.7*  PLT 445* 424* 405*   BMET  Recent Labs  04/01/13 1228 04/01/13 1719 04/02/13 0710 04/03/13 0405  NA 139  --  141 145  K 3.2*  --  3.8 3.6  CL 94*  --  100 103  CO2 27  --  30 30  GLUCOSE 166*  --  124* 148*  BUN 23  --  24* 20  CREATININE 2.05* 2.09* 1.73* 1.45*  CALCIUM 10.1  --  9.6 9.9   LFT  Recent Labs  04/01/13 1228  PROT 8.8*  ALBUMIN 3.6  AST 23  ALT 30  ALKPHOS 99  BILITOT 0.9  BILIDIR 0.2  IBILI 0.7   PT/INR  Recent Labs  04/01/13 1719  LABPROT 14.1  INR 1.11    ASSESMENT: *  Chronic n/v in well controlled diabetic.   sxs better after one of three doses of Solu-cortef, raising possiblity of adrenal insufficiency causing the sxs. *  Renal insufficiency/dehydration:  Improved.  *  Benign liver mass.  Radioablation planned per [facility] MDs. Suspected hepatic adenoma. *  Epigastric pain, improved but still needed to  get dose of Dilaudid this AM.  Has been told not to use Tylenol by the MD at [facility].    PLAN: *  After d/w pt, will try diabetic diet at lunch.  *  Leave IVF at 100/hour.  If tolerates lunch, can reduce the rate.  *  Change to once daily oral  Protonix.  *  Complete the 3 doses of Solucortef.    LOS: 3 days   Jennye Moccasin  04/04/2013, 10:07 AM Pager: [phone]  [facility] Attending  I have also seen and assessed the patient and agree with the above note. The patient has less nausea and no vomiting today. Solid food at lunch caused some nausea - grilled cheese. She seems better w/ hydrocortisone - so maybe there is a component of adrenal insufficiency. Let's see how she is tomorrow - consider a steroid taper. I think the intermittent odynophagia she has had is from vomiting induced esophageal injury. Will add temporary carafate slurry.  Iva Boop, MD, [facility]enterology [phone] (pager) 04/04/2013 5:53 PM

## 2013-04-04 NOTE — Progress Notes (Signed)
[facility] PROGRESS NOTE  [name] YNW:[id number] DOB: [birth year] DOA: 04/01/2013 PCP: [name] Parker, MD  Assessment/Plan:  Nausea, Vomiting, Abdominal pain:  Continue IV hydration.  Reglan was discontinue due to dystonic symptoms. Patient with less muscle contraction of the face. Appreciate GI evaluation.  Patient feeling better.  No more vomiting.  Unclear etiology. Improved after steroids.   Acute kidney injury  Likely prerenal . Hold lasix and ACE inhibitor. Renal function improving, Cr at 1.2.  replete K IV with 3 runs.   Oral thrush? Odynophagia:  Continue IV Diflucan  GI consulted.   Liver mass.  Being worked up at [facility] under the care of Dr. Deveron [name], [name]. so far documents indicate that this is a benign lesion for which she is supposed to get an interventional radiologist guided treatment on the 17th of this month. Per patient she has been told this was not malignant. Her biopsies were reviewed they appear to be inconclusive.   Chronic diastolic and systolic heart failure  EF of 65% currently euvolemic. Continue beta blocker. Lasix and ACE inhibitor held in neutral acute kidney injury. Continue bidil.   History of SLE and Sjogren's syndrome.  Stable   Diabetes mellitus  Continue with SSI. Marland Kitchen A1C of 6.4  DVT Prophylaxis SQ Heparin  UTI: Urine culture with no significant growth. UA with 21 to 50 WBC. Will complete 3 days of IV antibiotics. Day 3 ceftriaxone.    Consultants:  [facility].   Procedures:  none  Antibiotics:  Ceftriaxone 9-3  HPI/Subjective: Feeling better. No significant nausea. abdominal pain improved. Last time she vomit was last night.   Objective: Filed Vitals:   04/03/13 2319  BP: 108/61  Pulse: 80  Temp: 99 F (37.2 C)  Resp: 18    Intake/Output Summary (Last 24 hours) at 04/04/13 1309 Last data filed at 04/04/13 0900  Gross per 24 hour  Intake    240 ml  Output      0 ml  Net    240 ml    Filed Weights   04/02/13 0556 04/03/13 0548 04/04/13 1119  Weight: 165.3 kg (364 lb 6.7 oz) 166.5 kg (367 lb 1.1 oz) 168.194 kg (370 lb 12.8 oz)    Exam:   General:  No distress.  Cardiovascular: S 1, S 2 RRR  Respiratory: CTA  Abdomen: Bs present, mild epigastric tenderness, no rigidity  Musculoskeletal: no edema.   Data Reviewed: Basic Metabolic Panel:  Recent Labs Lab 04/01/13 1228 04/01/13 1719 04/02/13 0710 04/03/13 0405 04/04/13 1145  NA 139  --  141 145 136  K 3.2*  --  3.8 3.6 3.2*  CL 94*  --  100 103 97  CO2 27  --  30 30 28   GLUCOSE 166*  --  124* 148* 144*  BUN 23  --  24* 20 14  CREATININE 2.05* 2.09* 1.73* 1.45* 1.22*  CALCIUM 10.1  --  9.6 9.9 9.8  MG  --   --  2.3  --   --    Liver Function Tests:  Recent Labs Lab 04/01/13 1228  AST 23  ALT 30  ALKPHOS 99  BILITOT 0.9  PROT 8.8*  ALBUMIN 3.6    Recent Labs Lab 04/01/13 1228  LIPASE 22   No results found for this basename: AMMONIA,  in the last 168 hours CBC:  Recent Labs Lab 04/01/13 1228 04/01/13 1719 04/02/13 0710 04/04/13 1145  WBC 13.6* 13.0* 11.7* 11.7*  NEUTROABS 9.7*  --   --   --  HGB 10.5* 9.6* 8.9* 9.1*  HCT 30.6* 29.2* 27.7* 27.2*  MCV 80.1 81.3 82.7 81.0  PLT 445* 424* 405* 466*   Cardiac Enzymes:  Recent Labs Lab 04/01/13 1719 04/01/13 2100  TROPONINI <0.30 <0.30   BNP (last 3 results)  Recent Labs  02/20/13 0443 02/25/13 1918 04/01/13 1228  PROBNP 397.9* 162.5* 29.6   CBG:  Recent Labs Lab 04/03/13 1156 04/03/13 1722 04/03/13 2318 04/04/13 0814 04/04/13 1219  GLUCAP 147* 148* 165* 206* 133*    Recent Results (from the past 240 hour(s))  URINE CULTURE     Status: None   Collection Time    04/02/13  1:49 PM      Result Value Range Status   Specimen Description URINE, CLEAN CATCH   Final   Special Requests NONE   Final   Culture  Setup Time     Final   Value: 04/02/2013 15:12     Performed at [facility]  Count     Final   Value: 30,000 COLONIES/ML     Performed at [facility]   Culture     Final   Value: Multiple bacterial morphotypes present, none predominant. Suggest appropriate recollection if clinically indicated.     Performed at [facility]   Report Status 04/03/2013 FINAL   Final     Studies: No results found.  Scheduled Meds: . aspirin EC  81 mg Oral Daily  . azaTHIOprine  50 mg Oral BID  . buPROPion  100 mg Oral BID  . carvedilol  3.125 mg Oral BID WC  . cefTRIAXone (ROCEPHIN)  IV  1 g Intravenous Q24H  . citalopram  10 mg Oral QPM  . clopidogrel  75 mg Oral Daily  . cycloSPORINE  1 drop Both Eyes TID  . fluconazole (DIFLUCAN) IV  100 mg Intravenous Q24H  . gabapentin  300 mg Oral BID  . heparin  5,000 Units Subcutaneous Q8H  . hydrocortisone sod succinate (SOLU-CORTEF) inj  100 mg Intravenous Q8H  . hydroxychloroquine  200 mg Oral BID  . insulin aspart  0-9 Units Subcutaneous TID WC  . isosorbide-hydrALAZINE  1 tablet Oral TID  . loratadine  10 mg Oral Daily  . [START ON 04/05/2013] pantoprazole  40 mg Oral Q0600  . polyethylene glycol  17 g Oral BID  . potassium chloride  10 mEq Intravenous Q1 Hr x 3  . pravastatin  10 mg Oral QPC supper  . predniSONE  5 mg Oral Q breakfast  . senna  2 tablet Oral BID  . traZODone  50 mg Oral QHS   Continuous Infusions: . sodium chloride 50 mL/hr at 04/04/13 0846    Principal Problem:   ARF (acute renal failure) Active Problems:   IDDM   SLE   SJOGREN'S SYNDROME   DM (diabetes mellitus)   HTN (hypertension)   Systolic CHF, acute on chronic   Chronic diastolic heart failure   Morbid obesity   Hypoglycemia   Intractable vomiting   Abdominal pain, epigastric   Liver mass   Gastroparesis    Time spent: 25 minutes.     [name]  [facility] Pager [phone]. If 7PM-7AM, please contact night-coverage at [email or web address], password [facility] 04/04/2013, 1:09 PM  LOS: 3 days

## 2013-04-05 DIAGNOSIS — R1310 Dysphagia, unspecified: Secondary | ICD-10-CM

## 2013-04-05 DIAGNOSIS — R1013 Epigastric pain: Secondary | ICD-10-CM

## 2013-04-05 DIAGNOSIS — I5032 Chronic diastolic (congestive) heart failure: Secondary | ICD-10-CM

## 2013-04-05 DIAGNOSIS — N179 Acute kidney failure, unspecified: Secondary | ICD-10-CM

## 2013-04-05 LAB — CBC
HCT: 25.6 % — ABNORMAL LOW (ref 36.0–46.0)
Hemoglobin: 8.3 g/dL — ABNORMAL LOW (ref 12.0–15.0)
MCH: 26.3 pg (ref 26.0–34.0)
MCV: 81 fL (ref 78.0–100.0)
RBC: 3.16 MIL/uL — ABNORMAL LOW (ref 3.87–5.11)
WBC: 10.5 10*3/uL (ref 4.0–10.5)

## 2013-04-05 LAB — GLUCOSE, CAPILLARY
Glucose-Capillary: 116 mg/dL — ABNORMAL HIGH (ref 70–99)
Glucose-Capillary: 243 mg/dL — ABNORMAL HIGH (ref 70–99)

## 2013-04-05 LAB — BASIC METABOLIC PANEL
CO2: 30 mEq/L (ref 19–32)
Calcium: 9.3 mg/dL (ref 8.4–10.5)
Chloride: 100 mEq/L (ref 96–112)
Creatinine, Ser: 1.16 mg/dL — ABNORMAL HIGH (ref 0.50–1.10)
Glucose, Bld: 127 mg/dL — ABNORMAL HIGH (ref 70–99)

## 2013-04-05 MED ORDER — PREDNISONE 20 MG PO TABS
40.0000 mg | ORAL_TABLET | Freq: Every day | ORAL | Status: DC
  Administered 2013-04-05 – 2013-04-06 (×2): 40 mg via ORAL
  Filled 2013-04-05: qty 1
  Filled 2013-04-05: qty 2

## 2013-04-05 MED ORDER — HYDROCODONE-ACETAMINOPHEN 5-325 MG PO TABS: 1.0000 | ORAL_TABLET | ORAL | Status: DC | PRN

## 2013-04-05 MED ORDER — PREDNISONE 20 MG PO TABS
20.0000 mg | ORAL_TABLET | Freq: Every day | ORAL | Status: DC
  Filled 2013-04-05: qty 1

## 2013-04-05 NOTE — Progress Notes (Signed)
     [facility] Daily Rounding Note 04/05/2013, 9:34 AM  SUBJECTIVE:       No nausea after baked potato last night.  This Am ate grits, sausage and some scrambled eggs and feels some nausea.  Last BM yesterday.  No epigastric pain.  OBJECTIVE:         Vital signs in last 24 hours:    Temp:  [98.1 F (36.7 C)-99 F (37.2 C)] 98.1 F (36.7 C) (09/05 0604) Pulse Rate:  [75-82] 75 (09/05 0604) Resp:  [16-18] 16 (09/05 0604) BP: (120-140)/(66-86) 140/86 mmHg (09/05 0604) SpO2:  [96 %-100 %] 96 % (09/05 0604) Weight:  [168.194 kg (370 lb 12.8 oz)-170.825 kg (376 lb 9.6 oz)] 170.825 kg (376 lb 9.6 oz) (09/05 0700) Last BM Date: 04/03/13 General: obese, comfortable   Heart: RRR Chest: clear bil Abdomen: soft, slight epigastric tenderness.  BS hypoactive  Extremities: no CCE Neuro/Psych:  Oriented x 3.  No tremor or restless legs.   Intake/Output from previous day: 09/04 0701 - 09/05 0700 In: 1520 [P.O.:720; I.V.:800] Out: -   Intake/Output this shift:    Lab Results:  Recent Labs  04/04/13 1145 04/05/13 0440  WBC 11.7* 10.5  HGB 9.1* 8.3*  HCT 27.2* 25.6*  PLT 466* 405*   BMET  Recent Labs  04/03/13 0405 04/04/13 1145 04/05/13 0440  NA 145 136 138  K 3.6 3.2* 3.6  CL 103 97 100  CO2 30 28 30   GLUCOSE 148* 144* 127*  BUN 20 14 13   CREATININE 1.45* 1.22* 1.16*  CALCIUM 9.9 9.8 9.3   ASSESMENT: * Chronic n/v in well controlled diabetic.  sxs better but not resolved after three doses of hydrocortisone, raising possiblity of adrenal insufficiency causing the sxs. * Anemia, normocytic.  *  Reflux esophagitis on 02/26/13 EGD.  * Renal insufficiency/dehydration: Improved.  * Benign liver mass. Radioablation planned per [facility] MDs. Suspected hepatic adenoma.  * Epigastric pain, improved but still needed to get dose of Dilaudid this AM. Has been told not to use Tylenol by the MD at [facility].    PLAN: *  Per Dr Leone Payor.  *  Dr Sunnie Nielsen starting prednisone 20 mg  today.     LOS: 4 days   Jennye Moccasin  04/05/2013, 9:34 AM Pager: [phone]  Attending GI  She is improving. Empiric steroid increase with taper is plan. Carafate for now - would do x 1 month at dc. Please call us back if ?'s  Iva Boop, MD, [facility] Gastroenterology [phone] (pager) 04/05/2013 3:40 PM

## 2013-04-05 NOTE — Progress Notes (Addendum)
[facility] PROGRESS NOTE  [name] ZOX:[id number] DOB: Aug 02, 1970 DOA: 04/01/2013 PCP: Ron Parker, MD  Assessment/Plan:  Nausea, Vomiting, Abdominal pain:  Continue IV hydration.  Reglan was discontinue due to dystonic symptoms. Patient with less muscle contraction of the face. Appreciate GI evaluation.  Patient feeling better.  No more vomiting. Tolerate diet yesterday, but complaining of nausea this morning.  Unclear etiology. Improved after steroids.  Started prednisone 40 mg daily. Need to taper to home dose.   Acute kidney injury  Likely prerenal . Hold lasix and ACE inhibitor. Renal function improving, Cr at 1.1.    Oral thrush? Odynophagia:  Continue IV Diflucan  GI consulted. No endoscopy planned. Carafate started 9-4.  Liver mass.  Being worked up at [facility] under the care of Dr. Deveron Furlong, [name]. so far documents indicate that this is a benign lesion for which she is supposed to get an interventional radiologist guided treatment on the 17th of this month. Per patient she has been told this was not malignant. Her biopsies were reviewed they appear to be inconclusive.   Chronic diastolic and systolic heart failure  EF of 81% currently euvolemic. Continue beta blocker. Lasix and ACE inhibitor held in neutral acute kidney injury. Continue bidil.   History of SLE and Sjogren's syndrome.  Stable   Diabetes mellitus  Continue with SSI. Marland Kitchen A1C of 6.4   DVT Prophylaxis SQ Heparin  UTI: Urine culture with no significant growth. UA with 21 to 50 WBC. Will complete 3 days of IV antibiotics. Day 3 ceftriaxone.   Addendum:  Anemia: hb decrease this morning. No evidence of bleeding. Check anemia panel.   Consultants:  [facility].   Procedures:  none  Antibiotics:  Ceftriaxone 9-3  HPI/Subjective: She was able to tolerates dinner and had breakfast. Develop nausea this morning. She is afraid of vomiting. No significant abdominal pain.    Objective: Filed Vitals:   04/05/13 0604  BP: 140/86  Pulse: 75  Temp: 98.1 F (36.7 C)  Resp: 16    Intake/Output Summary (Last 24 hours) at 04/05/13 1232 Last data filed at 04/05/13 0600  Gross per 24 hour  Intake   1280 ml  Output      0 ml  Net   1280 ml   Filed Weights   04/05/13 0604 04/05/13 0700 04/05/13 0900  Weight: 170.235 kg (375 lb 4.8 oz) 170.825 kg (376 lb 9.6 oz) 170.825 kg (376 lb 9.6 oz)    Exam:   General:  No distress.  Cardiovascular: S 1, S 2 RRR  Respiratory: CTA  Abdomen: Bs present, mild epigastric tenderness, no rigidity  Musculoskeletal: no edema.   Data Reviewed: Basic Metabolic Panel:  Recent Labs Lab 04/01/13 1228 04/01/13 1719 04/02/13 0710 04/03/13 0405 04/04/13 1145 04/05/13 0440  NA 139  --  141 145 136 138  K 3.2*  --  3.8 3.6 3.2* 3.6  CL 94*  --  100 103 97 100  CO2 27  --  30 30 28 30   GLUCOSE 166*  --  124* 148* 144* 127*  BUN 23  --  24* 20 14 13   CREATININE 2.05* 2.09* 1.73* 1.45* 1.22* 1.16*  CALCIUM 10.1  --  9.6 9.9 9.8 9.3  MG  --   --  2.3  --   --   --    Liver Function Tests:  Recent Labs Lab 04/01/13 1228  AST 23  ALT 30  ALKPHOS 99  BILITOT 0.9  PROT  8.8*  ALBUMIN 3.6    Recent Labs Lab 04/01/13 1228  LIPASE 22   No results found for this basename: AMMONIA,  in the last 168 hours CBC:  Recent Labs Lab 04/01/13 1228 04/01/13 1719 04/02/13 0710 04/04/13 1145 04/05/13 0440  WBC 13.6* 13.0* 11.7* 11.7* 10.5  NEUTROABS 9.7*  --   --   --   --   HGB 10.5* 9.6* 8.9* 9.1* 8.3*  HCT 30.6* 29.2* 27.7* 27.2* 25.6*  MCV 80.1 81.3 82.7 81.0 81.0  PLT 445* 424* 405* 466* 405*   Cardiac Enzymes:  Recent Labs Lab 04/01/13 1719 04/01/13 2100  TROPONINI <0.30 <0.30   BNP (last 3 results)  Recent Labs  02/20/13 0443 02/25/13 1918 04/01/13 1228  PROBNP 397.9* 162.5* 29.6   CBG:  Recent Labs Lab 04/04/13 1219 04/04/13 1742 04/04/13 2221 04/05/13 0745 04/05/13 1142   GLUCAP 133* 162* 200* 116* 144*    Recent Results (from the past 240 hour(s))  URINE CULTURE     Status: None   Collection Time    04/02/13  1:49 PM      Result Value Range Status   Specimen Description URINE, CLEAN CATCH   Final   Special Requests NONE   Final   Culture  Setup Time     Final   Value: 04/02/2013 15:12     Performed at [facility] Count     Final   Value: 30,000 COLONIES/ML     Performed at [facility]   Culture     Final   Value: Multiple bacterial morphotypes present, none predominant. Suggest appropriate recollection if clinically indicated.     Performed at [facility]   Report Status 04/03/2013 FINAL   Final     Studies: No results found.  Scheduled Meds: . aspirin EC  81 mg Oral Daily  . azaTHIOprine  50 mg Oral BID  . buPROPion  100 mg Oral BID  . carvedilol  3.125 mg Oral BID WC  . cefTRIAXone (ROCEPHIN)  IV  1 g Intravenous Q24H  . citalopram  10 mg Oral QPM  . clopidogrel  75 mg Oral Daily  . cycloSPORINE  1 drop Both Eyes TID  . fluconazole (DIFLUCAN) IV  100 mg Intravenous Q24H  . gabapentin  300 mg Oral BID  . heparin  5,000 Units Subcutaneous Q8H  . hydroxychloroquine  200 mg Oral BID  . insulin aspart  0-9 Units Subcutaneous TID WC  . isosorbide-hydrALAZINE  1 tablet Oral TID  . loratadine  10 mg Oral Daily  . pantoprazole  40 mg Oral Q0600  . polyethylene glycol  17 g Oral BID  . pravastatin  10 mg Oral QPC supper  . predniSONE  40 mg Oral Q breakfast  . senna  2 tablet Oral BID  . sucralfate  1 g Oral TID WC & HS  . traZODone  50 mg Oral QHS   Continuous Infusions: . sodium chloride 50 mL/hr at 04/05/13 0600    Principal Problem:   ARF (acute renal failure) Active Problems:   IDDM   SLE   SJOGREN'S SYNDROME   DM (diabetes mellitus)   HTN (hypertension)   Systolic CHF, acute on chronic   Chronic diastolic heart failure   Morbid obesity   Hypoglycemia   Intractable vomiting    Abdominal pain, epigastric   Liver mass   Gastroparesis   Odynophagia    Time spent: 25 minutes.  Asheley Hellberg  [facility] Pager [phone]. If 7PM-7AM, please contact night-coverage at [email or web address], password [facility] 04/05/2013, 12:32 PM  LOS: 4 days

## 2013-04-06 LAB — FERRITIN: Ferritin: 241 ng/mL (ref 10–291)

## 2013-04-06 LAB — CBC
MCH: 26.6 pg (ref 26.0–34.0)
MCV: 81.5 fL (ref 78.0–100.0)
Platelets: 422 10*3/uL — ABNORMAL HIGH (ref 150–400)
RDW: 15.3 % (ref 11.5–15.5)
WBC: 13.6 10*3/uL — ABNORMAL HIGH (ref 4.0–10.5)

## 2013-04-06 LAB — BASIC METABOLIC PANEL
Calcium: 9.5 mg/dL (ref 8.4–10.5)
Creatinine, Ser: 1.17 mg/dL — ABNORMAL HIGH (ref 0.50–1.10)
GFR calc Af Amer: 66 mL/min — ABNORMAL LOW (ref >90)
Sodium: 137 mEq/L (ref 135–145)

## 2013-04-06 LAB — IRON AND TIBC
Saturation Ratios: 27 % (ref 20–55)
TIBC: 226 ug/dL — ABNORMAL LOW (ref 250–470)

## 2013-04-06 LAB — RETICULOCYTES
RBC.: 3.19 MIL/uL — ABNORMAL LOW (ref 3.87–5.11)
Retic Ct Pct: 1.3 % (ref 0.4–3.1)

## 2013-04-06 MED ORDER — TORSEMIDE 20 MG PO TABS: 20.0000 mg | ORAL_TABLET | Freq: Two times a day (BID) | ORAL | Status: DC

## 2013-04-06 MED ORDER — TRAZODONE HCL 50 MG PO TABS: 50.0000 mg | ORAL_TABLET | Freq: Every day | ORAL | Status: DC

## 2013-04-06 MED ORDER — BUPROPION HCL ER (SR) 100 MG PO TB12: 100.0000 mg | ORAL_TABLET | Freq: Two times a day (BID) | ORAL | Status: DC

## 2013-04-06 MED ORDER — FLUCONAZOLE 100 MG PO TABS: 100.0000 mg | ORAL_TABLET | Freq: Every day | ORAL | Status: DC

## 2013-04-06 MED ORDER — CITALOPRAM HYDROBROMIDE 10 MG PO TABS: 10.0000 mg | ORAL_TABLET | Freq: Every evening | ORAL | Status: DC

## 2013-04-06 MED ORDER — SUCRALFATE 1 GM/10ML PO SUSP: 1.0000 g | Freq: Three times a day (TID) | ORAL | Status: DC

## 2013-04-06 MED ORDER — ONDANSETRON HCL 4 MG PO TABS
4.0000 mg | ORAL_TABLET | Freq: Four times a day (QID) | ORAL | Status: DC | PRN
Start: 2013-04-06 — End: 2013-11-10

## 2013-04-06 MED ORDER — HYDROCODONE-ACETAMINOPHEN 5-325 MG PO TABS: 1.0000 | ORAL_TABLET | Freq: Three times a day (TID) | ORAL | Status: DC | PRN

## 2013-04-06 MED ORDER — PREDNISONE 10 MG PO TABS: ORAL_TABLET | ORAL | Status: DC

## 2013-04-06 NOTE — Discharge Summary (Signed)
Physician Discharge Summary  [name] ZOX:[id number] DOB: [birth year] DOA: 04/01/2013  PCP: [name] Parker, MD  Admit date: 04/01/2013 Discharge date: 04/06/2013  Time spent: 35 minutes  Recommendations for Outpatient Follow-up:  1. Needs to follow up PCP and Dr [name] for further evaluation of nausea.  2. Need further adjustment of chronic medication.  3. Needs evaluation for anemia. Please follow up anemia panel.  4. Need to follow up at [facility] with  Dr. Deveron [name] for liver mass.   Discharge Diagnoses:    Nausea, vomiting, component of adrenal insufficiency vs secondary to medications. Wellbutrin, trazodone dose reduced.    ARF (acute renal failure)   IDDM   SLE   SJOGREN'S SYNDROME   DM (diabetes mellitus)   HTN (hypertension)   Systolic CHF, acute on chronic   Chronic diastolic heart failure   Morbid obesity   Hypoglycemia   Intractable vomiting   Abdominal pain, epigastric   Liver mass   Odynophagia   Discharge Condition: improved.   Diet recommendation: Carb modified.   Filed Weights   04/05/13 0700 04/05/13 0900 04/06/13 0615  Weight: 170.825 kg (376 lb 9.6 oz) 170.825 kg (376 lb 9.6 oz) 170.592 kg (376 lb 1.4 oz)    History of present illness:  [name] is a 42 y.o. female, history of morbid obesity, diabetes mellitus type 2 insulin-dependent, Chronic diastolic heart failure systolic function has recovered last echo in March of 2014 shows EF of 55%, Liver mass. Being worked up at [facility] under the care of Dr. Deveron [name], [name], so far documents indicate that this is a benign lesion for which she is supposed to get an interventional radiologist guided treatment on the 17th of this month. Per patient she has been told this was not malignant. Her biopsies were reviewed they appear to be inconclusive, chronic diabetic gastroparesis with on-and-off episodes of emesis for the last 6-8 months admitted 1 month ago for this same issue, presents  to the hospital with 3 to four-day history of worsening of her baseline nausea vomiting, denies any pain per se but while she is having emesis she has some generalized epigastric discomfort. Denies exposure to any bad foods, no diarrhea, no fever chills, no chest pain or shortness of breath no palpitations. She presented to the ER where workup was suggestive of worsening of her diabetic gastroparesis with nonstop vomiting, dehydration and acute renal failure. I was called to admit the patient.   Hospital Course:  Nausea, Vomiting, Abdominal pain:  Patient was treated with IV fluid, Zofran PRN. Marland Kitchen Reglan was discontinue due to dystonic symptoms. Patient with less muscle contraction of the face after stopping reglan. Appreciate GI evaluation. This episode was thought to be secondary to adrenal insufficiency from not taking prednisone. Patient improved after IV hydrocortisone. Patient has not had gastric empty study done. She will need to have this test done when off narcotics. Patient Had EGD for n/v/abdominal pain by Dr [name] on 02/26/13 showing esophagitis andno retained gastric contents Patient feeling better. No more vomiting. Tolerate diet , still with mild nausea.  Started prednisone 40 mg daily, will discharge on prednisone taper. She was also started on Carafate for esophagitis symptoms.  The dose of  Wellbutrin and trazodone was decrease, thinking that this medication could be causing her symptoms.   Acute kidney injury  Likely prerenal . Hold  ACE inhibitor. Renal function improving, Cr at 1.1. Resume torsemide.  Oral thrush? Odynophagia:  Received IV diflucan.  GI consulted.  No endoscopy planned. Carafate started 9-4. Will give 7 days of diflucan.   Liver mass.  Being worked up at [facility] under the care of Dr. Deveron [name], [name]. so far documents indicate that this is a benign lesion for which she is supposed to get an interventional radiologist guided treatment on the 17th of  this month. Per patient she has been told this was not malignant. Her biopsies were reviewed they appear to be inconclusive.   Chronic diastolic and systolic heart failure  EF of 16% currently euvolemic. Continue beta blocker. ACE inhibitor held in neutral acute kidney injury. Continue bidil. Resume torsemide.   History of SLE and Sjogren's syndrome.  Stable  Diabetes mellitus  Continue with SSI. Marland Kitchen A1C of 6.4  DVT Prophylaxis SQ Heparin  UTI: Urine culture with no significant growth. UA with 21 to 50 WBC. Will complete 3 days of IV antibiotics. Day 3 ceftriaxone.  Anemia: hb stable at 8.5 No evidence of bleeding. anemia panel pending.    Procedures:  none  Consultations:  Dr [name].   Discharge Exam: Filed Vitals:   04/06/13 0615  BP: 113/61  Pulse: 82  Temp: 98.1 F (36.7 C)  Resp: 16    General: No distress.  Cardiovascular: S 1, S 2 RRR Respiratory: CTA  Discharge Instructions  Discharge Orders   Future Appointments Provider Department Dept Phone   04/11/2013 10:00 AM [name] Belling, MD [facility] [phone]   05/21/2013 9:45 AM [name] Belling, MD [place] [facility] [phone]   Future Orders Complete By Expires   Diet Carb Modified  As directed    Increase activity slowly  As directed        Medication List    STOP taking these medications       LOSARTAN POTASSIUM PO     metoCLOPramide 10 MG tablet  Commonly known as:  REGLAN     potassium chloride 10 MEQ tablet  Commonly known as:  K-DUR     pravastatin 10 MG tablet  Commonly known as:  PRAVACHOL      TAKE these medications       alendronate 70 MG tablet  Commonly known as:  FOSAMAX  Take 70 mg by mouth every 7 (seven) days. thursdays. Take with a full glass of water on an empty stomach.     ALPRAZolam 0.5 MG tablet  Commonly known as:  XANAX  Take 0.5 mg by mouth 3 (three) times daily as needed for sleep or anxiety.     aspirin EC 81 MG tablet   Take 81 mg by mouth daily.     azaTHIOprine 50 MG tablet  Commonly known as:  IMURAN  Take 50 mg by mouth 2 (two) times daily.     buPROPion 100 MG 12 hr tablet  Commonly known as:  WELLBUTRIN SR  Take 1 tablet (100 mg total) by mouth 2 (two) times daily.     cetirizine 10 MG tablet  Commonly known as:  ZYRTEC  Take 10 mg by mouth daily.     citalopram 10 MG tablet  Commonly known as:  CELEXA  Take 1 tablet (10 mg total) by mouth every evening.     clopidogrel 75 MG tablet  Commonly known as:  PLAVIX  Take 75 mg by mouth daily.     COREG 3.125 MG tablet  Generic drug:  carvedilol  Take 3.125 mg by mouth 2 (two) times daily with a meal.     cycloSPORINE  0.05 % ophthalmic emulsion  Commonly known as:  RESTASIS  Place 1 drop into both eyes 3 (three) times daily.        fluconazole 100 MG tablet  Commonly known as:  DIFLUCAN  Take 1 tablet (100 mg total) by mouth daily.     gabapentin 300 MG capsule  Commonly known as:  NEURONTIN  Take 300 mg by mouth 2 (two) times daily.     HYDROcodone-acetaminophen 5-325 MG per tablet  Commonly known as:  NORCO/VICODIN  Take 1 tablet by mouth every 8 (eight) hours as needed.     hydroxychloroquine 200 MG tablet  Commonly known as:  PLAQUENIL  Take 200 mg by mouth 2 (two) times daily.     isosorbide-hydrALAZINE 20-37.5 MG per tablet  Commonly known as:  BIDIL  Take 1 tablet by mouth 3 (three) times daily.     NOVOLOG FLEXPEN [place]  Inject 7 Units into the skin 3 (three) times daily before meals.     ondansetron 4 MG tablet  Commonly known as:  ZOFRAN  Take 1 tablet (4 mg total) by mouth every 6 (six) hours as needed for nausea.     pantoprazole 40 MG tablet  Commonly known as:  PROTONIX  Take 1 tablet (40 mg total) by mouth 2 (two) times daily.     polyethylene glycol packet  Commonly known as:  MIRALAX / GLYCOLAX  Take 17 g by mouth 2 (two) times daily.     predniSONE 10 MG tablet  Commonly known as:  DELTASONE  Take  4 tablet for 2 days Take 2 tablet for 2 days then take 1 tablet for 2 days then continue to take 5 mg daily.     PROAIR HFA 108 (90 BASE) MCG/ACT inhaler  Generic drug:  albuterol  Inhale 2 puffs into the lungs every 6 (six) hours as needed. For shortness of breath/wheeze     senna 8.6 MG Tabs tablet  Commonly known as:  SENOKOT  Take 2 tablets (17.2 mg total) by mouth 2 (two) times daily.     sucralfate 1 GM/10ML suspension  Commonly known as:  CARAFATE  Take 10 mLs (1 g total) by mouth 4 (four) times daily -  with meals and at bedtime.     torsemide 20 MG tablet  Commonly known as:  DEMADEX  Take 1 tablet (20 mg total) by mouth 2 (two) times daily.     traZODone 50 MG tablet  Commonly known as:  DESYREL  Take 1 tablet (50 mg total) by mouth at bedtime.       Allergies  Allergen Reactions  . Oxycodone-Acetaminophen Hives    FYI: no reaction to hydrocodone; tolerates dilaudid      The results of significant diagnostics from this hospitalization (including imaging, microbiology, ancillary and laboratory) are listed below for reference.    Significant Diagnostic Studies: Dg Chest 2 View  04/01/2013   *RADIOLOGY REPORT*  Clinical Data: Emesis, chest pain, shortness of breath  CHEST - 2 VIEW  Comparison: 02/26/2013  Findings: Mild cardiomegaly.  Vascular pattern normal.  Lungs clear.  IMPRESSION: No acute findings.   Original Report Authenticated By: Esperanza Heir, M.D.   Ct Abdomen Pelvis W Contrast  03/26/2013   *RADIOLOGY REPORT*  Clinical Data: Abdominal pain, recent liver biopsy  CT ABDOMEN AND PELVIS WITH CONTRAST  Technique:  Multidetector CT imaging of the abdomen and pelvis was performed following the standard protocol during bolus administration of intravenous contrast.  Contrast:  100 ml Omnipaque-300  Comparison: 02/20/2013 CT, 02/24/2013 MRI  Findings: Heart size upper normal to mildly enlarged.  Mild linear lung base opacities, favor atelectasis.  Hepatic steatosis.   Large segment 4/5 mass. Increased high attenuation along the posterior margin of the mass and apparent intralesional fluid fluid level may reflect intralesional hemorrhage. Heterogeneous enhancement of the inferior right hepatic lobe is nonspecific.  Unremarkable spleen, pancreas, adrenal glands, kidneys.  No hydroureteronephrosis.  Decompressed colon limits evaluation.  No overt colitis.  No bowel obstruction.  Normal appendix.  Rounded high density along the umbilicus is similar, measuring 3.2 cm.  Trace free fluid within the pelvis.  No free intraperitoneal air.  Small hiatal hernia.  No lymphadenopathy.  Normal caliber aorta and branch vessels without aneurysmal dilatation.  Decompressed bladder.  Unremarkable CT appearance to the uterus. No adnexal mass.  No acute osseous finding.  IMPRESSION: Post biopsy with high attenuation within the mass, may reflect intralesional hemorrhage.  No definite intraperitoneal bleed.  Wedge-shaped areas of hypoattenuation along the inferior margin of the right hepatic lobe are nonspecific. May reflect variant perfusion or infarctions.   Original Report Authenticated By: Jearld Lesch, M.D.   Dg Abd 2 Views  04/01/2013   *RADIOLOGY REPORT*  Clinical Data: Nausea and diarrhea  ABDOMEN - 2 VIEW  Comparison: None.  Findings: No free air.  Primarily gasless abdomen which by definition is nonspecific, although there is a small amount of gas into hepatic flexure of colon.  There is also a small amount of gas in the stomach and minimal gas into the splenic flexure of colon.  IMPRESSION: A technically nonspecific gas pattern as obstruction can rarely present with a gasless pattern.  However, there is a small amount of gas present and the findings are most likely within normal limits.   Original Report Authenticated By: Esperanza Heir, M.D.    Microbiology: Recent Results (from the past 240 hour(s))  URINE CULTURE     Status: None   Collection Time    04/02/13  1:49 PM       Result Value Range Status   Specimen Description URINE, CLEAN CATCH   Final   Special Requests NONE   Final   Culture  Setup Time     Final   Value: 04/02/2013 15:12     Performed at [facility] Count     Final   Value: 30,000 COLONIES/ML     Performed at [facility]   Culture     Final   Value: Multiple bacterial morphotypes present, none predominant. Suggest appropriate recollection if clinically indicated.     Performed at [facility]   Report Status 04/03/2013 FINAL   Final     Labs: Basic Metabolic Panel:  Recent Labs Lab 04/02/13 0710 04/03/13 0405 04/04/13 1145 04/05/13 0440 04/06/13 0535  NA 141 145 136 138 137  K 3.8 3.6 3.2* 3.6 3.8  CL 100 103 97 100 99  CO2 30 30 28 30 27   GLUCOSE 124* 148* 144* 127* 173*  BUN 24* 20 14 13 12   CREATININE 1.73* 1.45* 1.22* 1.16* 1.17*  CALCIUM 9.6 9.9 9.8 9.3 9.5  MG 2.3  --   --   --   --    Liver Function Tests:  Recent Labs Lab 04/01/13 1228  AST 23  ALT 30  ALKPHOS 99  BILITOT 0.9  PROT 8.8*  ALBUMIN 3.6    Recent Labs Lab 04/01/13 1228  LIPASE 22  No results found for this basename: AMMONIA,  in the last 168 hours CBC:  Recent Labs Lab 04/01/13 1228 04/01/13 1719 04/02/13 0710 04/04/13 1145 04/05/13 0440 04/06/13 0535  WBC 13.6* 13.0* 11.7* 11.7* 10.5 13.6*  NEUTROABS 9.7*  --   --   --   --   --   HGB 10.5* 9.6* 8.9* 9.1* 8.3* 8.5*  HCT 30.6* 29.2* 27.7* 27.2* 25.6* 26.0*  MCV 80.1 81.3 82.7 81.0 81.0 81.5  PLT 445* 424* 405* 466* 405* 422*   Cardiac Enzymes:  Recent Labs Lab 04/01/13 1719 04/01/13 2100  TROPONINI <0.30 <0.30   BNP: BNP (last 3 results)  Recent Labs  02/20/13 0443 02/25/13 1918 04/01/13 1228  PROBNP 397.9* 162.5* 29.6   CBG:  Recent Labs Lab 04/05/13 0745 04/05/13 1142 04/05/13 1720 04/05/13 2225 04/06/13 0801  GLUCAP 116* 144* 243* 268* 168*       Signed:  Carisha Kantor  [facility] 04/06/2013, 9:52  AM

## 2013-04-11 ENCOUNTER — Ambulatory Visit: Payer: Medicaid Other | Admitting: Endocrinology

## 2013-04-13 NOTE — ED Provider Notes (Signed)
Please see the original notes. I evaluated the patient with Dr. Malen Gauze. The patient c/o SOB, following one day of n/v and an inability to tolerate diuretics.   Gerhard Munch, MD 04/13/13 [phone]

## 2013-04-15 ENCOUNTER — Telehealth: Payer: Self-pay | Admitting: Endocrinology

## 2013-04-15 ENCOUNTER — Encounter: Payer: Self-pay | Admitting: Endocrinology

## 2013-04-15 ENCOUNTER — Ambulatory Visit (INDEPENDENT_AMBULATORY_CARE_PROVIDER_SITE_OTHER): Payer: Medicaid Other | Admitting: Endocrinology

## 2013-04-15 VITALS — BP 128/80 | HR 110 | Ht 68.0 in | Wt 387.0 lb

## 2013-04-15 DIAGNOSIS — E119 Type 2 diabetes mellitus without complications: Secondary | ICD-10-CM

## 2013-04-15 MED ORDER — INSULIN REGULAR HUMAN (CONC) 500 UNIT/ML ~~LOC~~ SOLN: 15.0000 [IU] | Freq: Three times a day (TID) | SUBCUTANEOUS | Status: DC

## 2013-04-15 NOTE — Patient Instructions (Addendum)
Please change the novolog back to the u-500.  Take this 15 units (draw up as 3 units) 3 times a day (just before each meal).   We may need to increase this further, so please keep careful track of your blood sugar, and call if it stays over 200.  check your blood sugar 2 times a day.  vary the time of day when you check, between before the 3 meals, and at bedtime.  also check if you have symptoms of your blood sugar being too high or too low.  please keep a record of the readings and bring it to your next appointment here.  please call us sooner if you are having low blood sugar episodes, or if it stays over 200.   Please come back for a follow-up appointment in 2 weeks.

## 2013-04-15 NOTE — Progress Notes (Signed)
Subjective:    Patient ID: [name], female    DOB: 1971-07-29, 42 y.o.   MRN: [id number]  HPI Pt returns for f/u of insulin-requiring DM (dx'ed 2011; she has mild if any neuropathy of the lower extremities; she has no known associated complications; she has never had severe hypoglycemia or DKA).  She was on a high dosage of U-500.  However, she then lost weight, and her insulin requirement decreased.  She now takes only novolog, a total of approx 6 units per day.  Since last ov here, she has been in the hospital, for her liver mass.  She will have this ablated at [facility] in 2 days.   She takes prednisone, 5 mg qd.  no cbg record, but states cbg's are back up to the 300's.   Past Medical History  Diagnosis Date  . Bell's palsy 08/09/2010  . SLE 08/09/2010  . SJOGREN'S SYNDROME 08/09/2010  . Hypertension   . High cholesterol   . Anginal pain   . Obstructive sleep apnea on CPAP 2011  . Anxiety   . GERD (gastroesophageal reflux disease)   . CHF (congestive heart failure)   . Acute renal failure      Intractable nausea vomiting secondary to diabetic gastroparesis causing dehydration and acute renal failure /notes 04/01/2013  . Liver mass     biopsied 03/2013 at [facility], not malignant.  is to undergo radiologic ablation of the mass in sept/October 2014.   Marland Kitchen Pneumonia 2000's    "once" (04/01/2013)  . On home oxygen therapy     "2L 24/7" (04/01/2013)  . Diabetic gastroparesis associated with type 2 diabetes mellitus     this is presumed diagnoses, not confirmed by any studies.   . Migraines     "maybe a couple times/yr" (04/01/2013)  . Stroke 2010; 10/2012    "left side is still weak from it, never fully regained full strength; no additions from stroke 10/2012"    Past Surgical History  Procedure Laterality Date  . Ectopic pregnancy surgery  1999  . Cardiac catheterization  02/07/12  . Hernia repair  1980's    umbilical  . Epidermoid cyst excision  2007     left breast  .  Esophagogastroduodenoscopy N/A 02/26/2013    Procedure: ESOPHAGOGASTRODUODENOSCOPY (EGD);  Surgeon: Hilarie Fredrickson, MD;  Location: [facility] ENDOSCOPY;  Service: Endoscopy;  Laterality: N/A;  . Breast cyst excision Left 2007    epidermoid    History   Social History  . Marital Status: Single    Spouse Name: N/A    Number of Children: N/A  . Years of Education: N/A   Occupational History  .      student   Social History Main Topics  . Smoking status: Former Smoker -- 3 years    Types: Cigarettes    Quit date: 06/01/1990  . Smokeless tobacco: Never Used  . Alcohol Use: No  . Drug Use: No  . Sexual Activity: Not Currently   Other Topics Concern  . Not on file   Social History Narrative   Regular exercise-yes    Current Outpatient Prescriptions on File Prior to Visit  Medication Sig Dispense Refill  . albuterol (PROAIR HFA) 108 (90 BASE) MCG/ACT inhaler Inhale 2 puffs into the lungs every 6 (six) hours as needed. For shortness of breath/wheeze      . alendronate (FOSAMAX) 70 MG tablet Take 70 mg by mouth every 7 (seven) days. thursdays. Take with a full glass of water  on an empty stomach.      . ALPRAZolam (XANAX) 0.5 MG tablet Take 0.5 mg by mouth 3 (three) times daily as needed for sleep or anxiety.      Marland Kitchen aspirin EC 81 MG tablet Take 81 mg by mouth daily.      Marland Kitchen azaTHIOprine (IMURAN) 50 MG tablet Take 50 mg by mouth 2 (two) times daily.       Marland Kitchen buPROPion (WELLBUTRIN SR) 100 MG 12 hr tablet Take 1 tablet (100 mg total) by mouth 2 (two) times daily.  30 tablet  0  . carvedilol (COREG) 3.125 MG tablet Take 3.125 mg by mouth 2 (two) times daily with a meal.      . cetirizine (ZYRTEC) 10 MG tablet Take 10 mg by mouth daily.      . citalopram (CELEXA) 10 MG tablet Take 1 tablet (10 mg total) by mouth every evening.  30 tablet  0  . clopidogrel (PLAVIX) 75 MG tablet Take 75 mg by mouth daily.      . cycloSPORINE (RESTASIS) 0.05 % ophthalmic emulsion Place 1 drop into both eyes 3 (three)  times daily.      . fluconazole (DIFLUCAN) 100 MG tablet Take 1 tablet (100 mg total) by mouth daily.  7 tablet  0  . gabapentin (NEURONTIN) 300 MG capsule Take 300 mg by mouth 2 (two) times daily.       Marland Kitchen HYDROcodone-acetaminophen (NORCO/VICODIN) 5-325 MG per tablet Take 1 tablet by mouth every 8 (eight) hours as needed.  10 tablet  0  . hydroxychloroquine (PLAQUENIL) 200 MG tablet Take 200 mg by mouth 2 (two) times daily.       . isosorbide-hydrALAZINE (BIDIL) 20-37.5 MG per tablet Take 1 tablet by mouth 3 (three) times daily.      . ondansetron (ZOFRAN) 4 MG tablet Take 1 tablet (4 mg total) by mouth every 6 (six) hours as needed for nausea.  20 tablet  0  . pantoprazole (PROTONIX) 40 MG tablet Take 1 tablet (40 mg total) by mouth 2 (two) times daily.      . polyethylene glycol (MIRALAX / GLYCOLAX) packet Take 17 g by mouth 2 (two) times daily.  14 each  0  . predniSONE (DELTASONE) 10 MG tablet Take 4 tablet for 2 days Take 2 tablet for 2 days then take 1 tablet for 2 days then continue to take 5 mg daily.  18 tablet  0  . senna (SENOKOT) 8.6 MG TABS Take 2 tablets (17.2 mg total) by mouth 2 (two) times daily.  120 each  0  . sucralfate (CARAFATE) 1 GM/10ML suspension Take 10 mLs (1 g total) by mouth 4 (four) times daily -  with meals and at bedtime.  420 mL  0  . torsemide (DEMADEX) 20 MG tablet Take 1 tablet (20 mg total) by mouth 2 (two) times daily.  30 tablet  0  . traZODone (DESYREL) 50 MG tablet Take 1 tablet (50 mg total) by mouth at bedtime.  30 tablet  0   No current facility-administered medications on file prior to visit.   Allergies  Allergen Reactions  . Oxycodone-Acetaminophen Hives    FYI: no reaction to hydrocodone; tolerates dilaudid   Family History  Problem Relation Age of Onset  . Diabetes Mother   . Hypertension Other   . Stroke Other   . Arthritis Other    BP 128/80  Pulse 110  Ht 5\' 8"  (1.727 m)  Wt 387 lb (175.542  kg)  BMI 58.86 kg/m2  SpO2 98%  Review  of Systems denies hypoglycemia and n/v    Objective:   Physical Exam VITAL SIGNS:  See vs page GENERAL: no distress.  Morbid obesity.  Has 02 on.     Assessment & Plan:  DM: she needs increased rx.  SLE: prednisone rx complicates the rx of DM.  CHF: this severely limits exercise rx of DM

## 2013-04-18 ENCOUNTER — Encounter (HOSPITAL_COMMUNITY): Payer: Self-pay | Admitting: Emergency Medicine

## 2013-04-18 DIAGNOSIS — Z87448 Personal history of other diseases of urinary system: Secondary | ICD-10-CM | POA: Insufficient documentation

## 2013-04-18 DIAGNOSIS — Z9981 Dependence on supplemental oxygen: Secondary | ICD-10-CM | POA: Insufficient documentation

## 2013-04-18 DIAGNOSIS — I209 Angina pectoris, unspecified: Secondary | ICD-10-CM | POA: Insufficient documentation

## 2013-04-18 DIAGNOSIS — E7800 Pure hypercholesterolemia, unspecified: Secondary | ICD-10-CM | POA: Insufficient documentation

## 2013-04-18 DIAGNOSIS — G43909 Migraine, unspecified, not intractable, without status migrainosus: Secondary | ICD-10-CM | POA: Insufficient documentation

## 2013-04-18 DIAGNOSIS — Z8739 Personal history of other diseases of the musculoskeletal system and connective tissue: Secondary | ICD-10-CM | POA: Insufficient documentation

## 2013-04-18 DIAGNOSIS — I10 Essential (primary) hypertension: Secondary | ICD-10-CM | POA: Insufficient documentation

## 2013-04-18 DIAGNOSIS — Z79899 Other long term (current) drug therapy: Secondary | ICD-10-CM | POA: Insufficient documentation

## 2013-04-18 DIAGNOSIS — Z9861 Coronary angioplasty status: Secondary | ICD-10-CM | POA: Insufficient documentation

## 2013-04-18 DIAGNOSIS — K3184 Gastroparesis: Secondary | ICD-10-CM | POA: Insufficient documentation

## 2013-04-18 DIAGNOSIS — I509 Heart failure, unspecified: Secondary | ICD-10-CM | POA: Insufficient documentation

## 2013-04-18 DIAGNOSIS — Z87891 Personal history of nicotine dependence: Secondary | ICD-10-CM | POA: Insufficient documentation

## 2013-04-18 DIAGNOSIS — Z7902 Long term (current) use of antithrombotics/antiplatelets: Secondary | ICD-10-CM | POA: Insufficient documentation

## 2013-04-18 DIAGNOSIS — E1149 Type 2 diabetes mellitus with other diabetic neurological complication: Secondary | ICD-10-CM | POA: Insufficient documentation

## 2013-04-18 DIAGNOSIS — R109 Unspecified abdominal pain: Secondary | ICD-10-CM | POA: Insufficient documentation

## 2013-04-18 DIAGNOSIS — Z8673 Personal history of transient ischemic attack (TIA), and cerebral infarction without residual deficits: Secondary | ICD-10-CM | POA: Insufficient documentation

## 2013-04-18 DIAGNOSIS — K920 Hematemesis: Secondary | ICD-10-CM | POA: Insufficient documentation

## 2013-04-18 DIAGNOSIS — Z8701 Personal history of pneumonia (recurrent): Secondary | ICD-10-CM | POA: Insufficient documentation

## 2013-04-18 DIAGNOSIS — IMO0002 Reserved for concepts with insufficient information to code with codable children: Secondary | ICD-10-CM | POA: Insufficient documentation

## 2013-04-18 DIAGNOSIS — F411 Generalized anxiety disorder: Secondary | ICD-10-CM | POA: Insufficient documentation

## 2013-04-18 DIAGNOSIS — Z7982 Long term (current) use of aspirin: Secondary | ICD-10-CM | POA: Insufficient documentation

## 2013-04-18 DIAGNOSIS — R079 Chest pain, unspecified: Secondary | ICD-10-CM | POA: Insufficient documentation

## 2013-04-18 DIAGNOSIS — Z794 Long term (current) use of insulin: Secondary | ICD-10-CM | POA: Insufficient documentation

## 2013-04-18 DIAGNOSIS — Z7983 Long term (current) use of bisphosphonates: Secondary | ICD-10-CM | POA: Insufficient documentation

## 2013-04-18 DIAGNOSIS — K219 Gastro-esophageal reflux disease without esophagitis: Secondary | ICD-10-CM | POA: Insufficient documentation

## 2013-04-18 DIAGNOSIS — G4733 Obstructive sleep apnea (adult) (pediatric): Secondary | ICD-10-CM | POA: Insufficient documentation

## 2013-04-18 LAB — CBC
MCHC: 32.4 g/dL (ref 30.0–36.0)
MCV: 83.6 fL (ref 78.0–100.0)
Platelets: 260 10*3/uL (ref 150–400)
RDW: 16.9 % — ABNORMAL HIGH (ref 11.5–15.5)
WBC: 12.8 10*3/uL — ABNORMAL HIGH (ref 4.0–10.5)

## 2013-04-18 MED ORDER — ONDANSETRON 4 MG PO TBDP
8.0000 mg | ORAL_TABLET | Freq: Once | ORAL | Status: AC
  Administered 2013-04-18: 8 mg via ORAL
  Filled 2013-04-18: qty 2

## 2013-04-18 NOTE — ED Notes (Signed)
Pt. reports mid chest pain with SOB , bloody emesis , lightheaded and chills for several days .

## 2013-04-19 ENCOUNTER — Encounter (HOSPITAL_COMMUNITY): Payer: Self-pay | Admitting: Radiology

## 2013-04-19 ENCOUNTER — Emergency Department (HOSPITAL_COMMUNITY): Payer: Medicaid Other

## 2013-04-19 ENCOUNTER — Emergency Department (HOSPITAL_COMMUNITY)
Admission: EM | Admit: 2013-04-19 | Discharge: 2013-04-19 | Disposition: A | Discharge status: Home / Self Care | Payer: Medicaid Other | Attending: Emergency Medicine | Admitting: Emergency Medicine

## 2013-04-19 DIAGNOSIS — R1110 Vomiting, unspecified: Secondary | ICD-10-CM

## 2013-04-19 DIAGNOSIS — K3184 Gastroparesis: Secondary | ICD-10-CM

## 2013-04-19 DIAGNOSIS — M7989 Other specified soft tissue disorders: Secondary | ICD-10-CM

## 2013-04-19 DIAGNOSIS — R109 Unspecified abdominal pain: Secondary | ICD-10-CM

## 2013-04-19 DIAGNOSIS — R0602 Shortness of breath: Secondary | ICD-10-CM

## 2013-04-19 HISTORY — DX: Obesity, unspecified: E66.9

## 2013-04-19 LAB — BASIC METABOLIC PANEL
Calcium: 8.9 mg/dL (ref 8.4–10.5)
Chloride: 98 mEq/L (ref 96–112)
Creatinine, Ser: 1.12 mg/dL — ABNORMAL HIGH (ref 0.50–1.10)
GFR calc Af Amer: 69 mL/min — ABNORMAL LOW (ref >90)
GFR calc non Af Amer: 60 mL/min — ABNORMAL LOW (ref >90)

## 2013-04-19 LAB — URINALYSIS, ROUTINE W REFLEX MICROSCOPIC
Bilirubin Urine: NEGATIVE
Glucose, UA: NEGATIVE mg/dL
Hgb urine dipstick: NEGATIVE
Ketones, ur: NEGATIVE mg/dL
Protein, ur: NEGATIVE mg/dL
Urobilinogen, UA: 1 mg/dL (ref 0.0–1.0)

## 2013-04-19 LAB — PRO B NATRIURETIC PEPTIDE: Pro B Natriuretic peptide (BNP): 231.5 pg/mL — ABNORMAL HIGH (ref 0–125)

## 2013-04-19 MED ORDER — HYDROMORPHONE HCL PF 1 MG/ML IJ SOLN
1.0000 mg | Freq: Once | INTRAMUSCULAR | Status: AC
  Administered 2013-04-19: 1 mg via INTRAVENOUS
  Filled 2013-04-19: qty 1

## 2013-04-19 MED ORDER — IOHEXOL 300 MG/ML  SOLN: 25.0000 mL | Freq: Once | INTRAMUSCULAR | Status: DC | PRN

## 2013-04-19 MED ORDER — ENOXAPARIN SODIUM 80 MG/0.8ML ~~LOC~~ SOLN
160.0000 mg | Freq: Once | SUBCUTANEOUS | Status: AC
  Administered 2013-04-19: 160 mg via SUBCUTANEOUS
  Filled 2013-04-19: qty 1.6

## 2013-04-19 MED ORDER — FAMOTIDINE IN NACL 20-0.9 MG/50ML-% IV SOLN
20.0000 mg | Freq: Once | INTRAVENOUS | Status: AC
  Administered 2013-04-19: 20 mg via INTRAVENOUS
  Filled 2013-04-19: qty 50

## 2013-04-19 MED ORDER — IOHEXOL 350 MG/ML SOLN
100.0000 mL | Freq: Once | INTRAVENOUS | Status: AC | PRN
  Administered 2013-04-19: 100 mL via INTRAVENOUS

## 2013-04-19 MED ORDER — MORPHINE SULFATE 4 MG/ML IJ SOLN
4.0000 mg | Freq: Once | INTRAMUSCULAR | Status: DC
  Filled 2013-04-19: qty 1

## 2013-04-19 MED ORDER — ONDANSETRON HCL 4 MG/2ML IJ SOLN
4.0000 mg | Freq: Once | INTRAMUSCULAR | Status: AC
  Administered 2013-04-19: 4 mg via INTRAVENOUS
  Filled 2013-04-19: qty 2

## 2013-04-19 MED ORDER — ENOXAPARIN SODIUM 100 MG/ML ~~LOC~~ SOLN: 100.0000 mg | Freq: Once | SUBCUTANEOUS | Status: DC

## 2013-04-19 MED ORDER — ACETAMINOPHEN 325 MG PO TABS
650.0000 mg | ORAL_TABLET | Freq: Once | ORAL | Status: AC
  Administered 2013-04-19: 650 mg via ORAL
  Filled 2013-04-19: qty 2

## 2013-04-19 MED ORDER — FENTANYL CITRATE 0.05 MG/ML IJ SOLN
50.0000 ug | Freq: Once | INTRAMUSCULAR | Status: AC
  Administered 2013-04-19: 50 ug via INTRAVENOUS
  Filled 2013-04-19: qty 2

## 2013-04-19 MED ORDER — METOCLOPRAMIDE HCL 5 MG/ML IJ SOLN
10.0000 mg | Freq: Once | INTRAMUSCULAR | Status: AC
  Administered 2013-04-19: 10 mg via INTRAVENOUS
  Filled 2013-04-19: qty 2

## 2013-04-19 MED ORDER — ENOXAPARIN SODIUM 40 MG/0.4ML ~~LOC~~ SOLN: 160.0000 mg | SUBCUTANEOUS | Status: DC

## 2013-04-19 NOTE — Progress Notes (Signed)
Bilateral lower extremity venous duplex:  No evidence of DVT, superficial thrombosis, or Baker's Cyst.   

## 2013-04-19 NOTE — ED Notes (Signed)
RN notified CT that there will be delay getting patient to CT due to lack of IV access. Multiple RNs have attempted IV without success. IV team notified.

## 2013-04-19 NOTE — ED Notes (Signed)
Pharmacy notified. Pharmacist will talk to MD to verify dosage.

## 2013-04-19 NOTE — ED Notes (Signed)
Pt in route to CT at this time.  

## 2013-04-19 NOTE — ED Notes (Signed)
Discussed pt plan of care with dr. Loretha Stapler and pt pain. To recheck BP and inform MD.

## 2013-04-19 NOTE — ED Provider Notes (Signed)
Care assumed from Dr. Lavella Lemons.  Patient with symptoms consistent with her prior flareups of gastroparesis. However she also complained of chest pain, worse with inspiration, and worsening shortness of breath.  On my multiple exams, she remained well appearing.  Several low blood pressure readings were felt to be secondary to obesity and cuff placement, not true hypotension, reinforced by her nontoxic appearance and normal mental status.  Her vomiting and abdominal pain improved during her ED course.   Her ED course was significantly prolonged by difficulty assessing her for pulmonary embolism. Her chest CT, ordered by Dr. Lavella Lemons, was inconclusive.  It noted artifact versus embolism. We were subsequently unable to obtain a VQ scan secondary to her morbid obesity. Therefore, we obtained lower extremity Dopplers, which were negative for DVT. While I felt that she was low risk for PE, she was not no risk. As I was unable to obtain definitive testing, I felt she was appropriate for anti-coagulation and outpatient followup. I discussed her Lovenox treatment with Dr. Mayford Knife (pharmacy) who recommended a dose of 160 mg twice a day.  She will followup on Monday at her regularly scheduled PCP appointment to determine whether she needs any further testing. Patient agreeable with plan.  Clinical Impression: 1. Vomiting   2. Morbid obesity   3. Shortness of breath   4. Gastroparesis   5. Abdominal  pain, other specified site       Candyce Churn, MD 04/19/13 1649

## 2013-04-19 NOTE — ED Provider Notes (Signed)
CSN: [id number]     Arrival date & time 04/18/13  2247 History   First MD Initiated Contact with Patient 04/19/13 0030     Chief Complaint  Patient presents with  . Chest Pain  . Hematemesis   (Consider location/radiation/quality/duration/timing/severity/associated sxs/prior Treatment) HPI  Patient is an unfortunate 42 yo woman with DM2 complicated by severe diabetic gastroparesis, chronic abdominal pain, CHF with O2 dependence, OSA, morbid obesity, Sjorgren's syndrome, CKD and SLE.   She presents with complaints of 4 days of illness. Her sx began with nausea and vomiting. She has been unable to keep down fluids. She feels dehydrated and says she feels like the room is spinning when she sits up. She denies diarrhea. She has noticed some blood streaks in her emesis but, no clots. No melena.   She says she had a fever to 104F per her oral thermometer at home. Patient says she is experiencing 10/10 pain from her head to her toes. "My stomach feels like a ton of bricks is hitting it".   Past Medical History  Diagnosis Date  . Bell's palsy 08/09/2010  . SLE 08/09/2010  . SJOGREN'S SYNDROME 08/09/2010  . Hypertension   . High cholesterol   . Anginal pain   . Obstructive sleep apnea on CPAP 2011  . Anxiety   . GERD (gastroesophageal reflux disease)   . CHF (congestive heart failure)   . Acute renal failure      Intractable nausea vomiting secondary to diabetic gastroparesis causing dehydration and acute renal failure /notes 04/01/2013  . Liver mass     biopsied 03/2013 at [facility], not malignant.  is to undergo radiologic ablation of the mass in sept/October 2014.   Marland Kitchen Pneumonia 2000's    "once" (04/01/2013)  . On home oxygen therapy     "2L 24/7" (04/01/2013)  . Diabetic gastroparesis associated with type 2 diabetes mellitus     this is presumed diagnoses, not confirmed by any studies.   . Migraines     "maybe a couple times/yr" (04/01/2013)  . Stroke 2010; 10/2012    "left side is still  weak from it, never fully regained full strength; no additions from stroke 10/2012"  . Obesity    Past Surgical History  Procedure Laterality Date  . Ectopic pregnancy surgery  1999  . Cardiac catheterization  02/07/12  . Hernia repair  1980's    umbilical  . Epidermoid cyst excision  2007     left breast  . Esophagogastroduodenoscopy N/A 02/26/2013    Procedure: ESOPHAGOGASTRODUODENOSCOPY (EGD);  Surgeon: Hilarie Fredrickson, MD;  Location: [facility] ENDOSCOPY;  Service: Endoscopy;  Laterality: N/A;  . Breast cyst excision Left 2007    epidermoid   Family History  Problem Relation Age of Onset  . Diabetes Mother   . Hypertension Other   . Stroke Other   . Arthritis Other    History  Substance Use Topics  . Smoking status: Former Smoker -- 3 years    Types: Cigarettes    Quit date: 06/01/1990  . Smokeless tobacco: Never Used  . Alcohol Use: No   OB History   Grav Para Term Preterm Abortions TAB SAB Ect Mult Living   2 1 1       1      Review of Systems 10 point ROS obtained and is notable for sx above as well as chronic arthralgias, chronic fatigue, most recent CBG of 190 pta.   Allergies  Oxycodone-acetaminophen  Home Medications   Current  Outpatient Rx  Name  Route  Sig  Dispense  Refill  . albuterol (PROAIR HFA) 108 (90 BASE) MCG/ACT inhaler   Inhalation   Inhale 2 puffs into the lungs every 6 (six) hours as needed. For shortness of breath/wheeze         . alendronate (FOSAMAX) 70 MG tablet   Oral   Take 70 mg by mouth every 7 (seven) days. thursdays. Take with a full glass of water on an empty stomach.         . ALPRAZolam (XANAX) 0.5 MG tablet   Oral   Take 0.5 mg by mouth 2 (two) times daily as needed for sleep or anxiety.          Marland Kitchen aspirin EC 81 MG tablet   Oral   Take 81 mg by mouth daily.         Marland Kitchen azaTHIOprine (IMURAN) 50 MG tablet   Oral   Take 50 mg by mouth 2 (two) times daily.          Marland Kitchen buPROPion (WELLBUTRIN SR) 100 MG 12 hr tablet   Oral    Take 1 tablet (100 mg total) by mouth 2 (two) times daily.   30 tablet   0   . carvedilol (COREG) 3.125 MG tablet   Oral   Take 3.125 mg by mouth 2 (two) times daily with a meal.         . cetirizine (ZYRTEC) 10 MG tablet   Oral   Take 10 mg by mouth daily.         . citalopram (CELEXA) 10 MG tablet   Oral   Take 1 tablet (10 mg total) by mouth every evening.   30 tablet   0   . clopidogrel (PLAVIX) 75 MG tablet   Oral   Take 75 mg by mouth daily.         . cycloSPORINE (RESTASIS) 0.05 % ophthalmic emulsion   Both Eyes   Place 1 drop into both eyes 3 (three) times daily.         Marland Kitchen gabapentin (NEURONTIN) 300 MG capsule   Oral   Take 300 mg by mouth 2 (two) times daily.          Marland Kitchen HYDROcodone-acetaminophen (NORCO/VICODIN) 5-325 MG per tablet   Oral   Take 1 tablet by mouth every 8 (eight) hours as needed.   10 tablet   0   . hydroxychloroquine (PLAQUENIL) 200 MG tablet   Oral   Take 200 mg by mouth 2 (two) times daily.          . insulin regular human CONCENTRATED (HUMULIN R) 500 UNIT/ML SOLN injection   Subcutaneous   Inject 0.03 mLs (15 Units total) into the skin 3 (three) times daily with meals. Please draw up as though it is just 3 units at a time.   20 mL   5   . isosorbide-hydrALAZINE (BIDIL) 20-37.5 MG per tablet   Oral   Take 1 tablet by mouth 3 (three) times daily.         . ondansetron (ZOFRAN) 4 MG tablet   Oral   Take 1 tablet (4 mg total) by mouth every 6 (six) hours as needed for nausea.   20 tablet   0   . pantoprazole (PROTONIX) 40 MG tablet   Oral   Take 1 tablet (40 mg total) by mouth 2 (two) times daily.         . polyethylene glycol (  MIRALAX / GLYCOLAX) packet   Oral   Take 17 g by mouth 2 (two) times daily.   14 each   0   . predniSONE (DELTASONE) 10 MG tablet      Take 4 tablet for 2 days Take 2 tablet for 2 days then take 1 tablet for 2 days then continue to take 5 mg daily.   18 tablet   0   . senna  (SENOKOT) 8.6 MG TABS   Oral   Take 2 tablets (17.2 mg total) by mouth 2 (two) times daily.   120 each   0   . sucralfate (CARAFATE) 1 GM/10ML suspension   Oral   Take 10 mLs (1 g total) by mouth 4 (four) times daily -  with meals and at bedtime.   420 mL   0   . torsemide (DEMADEX) 20 MG tablet   Oral   Take 1 tablet (20 mg total) by mouth 2 (two) times daily.   30 tablet   0   . traZODone (DESYREL) 50 MG tablet   Oral   Take 1 tablet (50 mg total) by mouth at bedtime.   30 tablet   0    BP 135/78  Pulse 94  Temp(Src) 101.1 F (38.4 C) (Oral)  Resp 22  SpO2 100% Physical Exam Gen: well developed , morbidly obese, appears older than stated age the patient appeared to be resting comfortably in recumbent position on gurney when after the room. However, throughout my time in her room, the patient made persistent whimpering sounds with expiration. Nurse gave patient tylenol while I was in room and the patient immediately began wretching. Head: NCAT Eyes: PERL, EOMI Nose: no epistaixis or rhinorrhea Mouth/throat: poor dentition, extensive caries, mucosa is mildly dehydrated appearing and pink Neck: supple, no stridor Lungs: exam limited due to body habitus, RR 24/min, no wheezing, rhonchi or rales appreciated.  Abd: soft, morbidly obese, nontender Back: no ttp, no cva ttp, kyphosis noted Skin: warm and dry Ext: no edema. Neuro: CN ii-xii grossly intact, no focal deficits Psyche; normal affect,  calm and cooperative.   ED Course  Procedures (including critical care time)  Results for orders placed during the hospital encounter of 04/19/13 (from the past 24 hour(s))  CBC     Status: Abnormal   Collection Time    04/18/13 10:35 PM      Result Value Range   WBC 12.8 (*) 4.0 - 10.5 K/uL   RBC 3.29 (*) 3.87 - 5.11 MIL/uL   Hemoglobin 8.9 (*) 12.0 - 15.0 g/dL   HCT 16.1 (*) 09.6 - 04.5 %   MCV 83.6  78.0 - 100.0 fL   MCH 27.1  26.0 - 34.0 pg   MCHC 32.4  30.0 - 36.0  g/dL   RDW 40.9 (*) 81.1 - 91.4 %   Platelets 260  150 - 400 K/uL  BASIC METABOLIC PANEL     Status: Abnormal   Collection Time    04/18/13 10:35 PM      Result Value Range   Sodium 138  135 - 145 mEq/L   Potassium 3.3 (*) 3.5 - 5.1 mEq/L   Chloride 98  96 - 112 mEq/L   CO2 31  19 - 32 mEq/L   Glucose, Bld 148 (*) 70 - 99 mg/dL   BUN 12  6 - 23 mg/dL   Creatinine, Ser 7.82 (*) 0.50 - 1.10 mg/dL   Calcium 8.9  8.4 - 95.6 mg/dL  GFR calc non Af Amer 60 (*) >90 mL/min   GFR calc Af Amer 69 (*) >90 mL/min  PRO B NATRIURETIC PEPTIDE     Status: Abnormal   Collection Time    04/18/13 10:35 PM      Result Value Range   Pro B Natriuretic peptide (BNP) 231.5 (*) 0 - 125 pg/mL  POCT I-STAT TROPONIN I     Status: None   Collection Time    04/18/13 11:21 PM      Result Value Range   Troponin i, poc 0.00  0.00 - 0.08 ng/mL   Comment 3            EKG: nsr, low voltage, normal QRS, normal axis, no acute ischemic changes, normal intervals.  MDM   Patient with sx consistent with diabetic gastroparesis. Query cause of fever - aspiration pneumonia? UTI?   Labs are thus far notable for WBC of 13K, stable anemia, mild hypokalemia. Negative troponin, very mildly elevated BNP of 240 pg/mL  CXR and U/A are pending. We are managing supportively at this time. Will review chart to learn more about the extent of the patient's hx of cardiomyopathy.     Brandt Loosen, MD 05/02/13 [phone]

## 2013-04-19 NOTE — ED Notes (Signed)
IV Team at bedside 

## 2013-04-19 NOTE — ED Notes (Signed)
Pt in route to Radiology.  

## 2013-04-21 ENCOUNTER — Inpatient Hospital Stay (HOSPITAL_COMMUNITY)
Admission: EM | Admit: 2013-04-21 | Discharge: 2013-04-28 | DRG: 074 | Disposition: A | Discharge status: Home / Self Care | Payer: Medicaid Other | Attending: Internal Medicine | Admitting: Internal Medicine

## 2013-04-21 ENCOUNTER — Emergency Department (HOSPITAL_COMMUNITY): Payer: Medicaid Other

## 2013-04-21 ENCOUNTER — Encounter (HOSPITAL_COMMUNITY): Payer: Self-pay | Admitting: Emergency Medicine

## 2013-04-21 DIAGNOSIS — Z87891 Personal history of nicotine dependence: Secondary | ICD-10-CM

## 2013-04-21 DIAGNOSIS — N179 Acute kidney failure, unspecified: Secondary | ICD-10-CM

## 2013-04-21 DIAGNOSIS — I5042 Chronic combined systolic (congestive) and diastolic (congestive) heart failure: Secondary | ICD-10-CM | POA: Diagnosis present

## 2013-04-21 DIAGNOSIS — M329 Systemic lupus erythematosus, unspecified: Secondary | ICD-10-CM

## 2013-04-21 DIAGNOSIS — Z7902 Long term (current) use of antithrombotics/antiplatelets: Secondary | ICD-10-CM

## 2013-04-21 DIAGNOSIS — T398X5A Adverse effect of other nonopioid analgesics and antipyretics, not elsewhere classified, initial encounter: Secondary | ICD-10-CM | POA: Diagnosis not present

## 2013-04-21 DIAGNOSIS — R29898 Other symptoms and signs involving the musculoskeletal system: Secondary | ICD-10-CM | POA: Diagnosis present

## 2013-04-21 DIAGNOSIS — K2100 Gastro-esophageal reflux disease with esophagitis, without bleeding: Secondary | ICD-10-CM

## 2013-04-21 DIAGNOSIS — D649 Anemia, unspecified: Secondary | ICD-10-CM

## 2013-04-21 DIAGNOSIS — R52 Pain, unspecified: Secondary | ICD-10-CM | POA: Diagnosis present

## 2013-04-21 DIAGNOSIS — R109 Unspecified abdominal pain: Secondary | ICD-10-CM

## 2013-04-21 DIAGNOSIS — I10 Essential (primary) hypertension: Secondary | ICD-10-CM

## 2013-04-21 DIAGNOSIS — Z79899 Other long term (current) drug therapy: Secondary | ICD-10-CM

## 2013-04-21 DIAGNOSIS — E1143 Type 2 diabetes mellitus with diabetic autonomic (poly)neuropathy: Secondary | ICD-10-CM

## 2013-04-21 DIAGNOSIS — Z6841 Body Mass Index (BMI) 40.0 and over, adult: Secondary | ICD-10-CM

## 2013-04-21 DIAGNOSIS — I5022 Chronic systolic (congestive) heart failure: Secondary | ICD-10-CM | POA: Diagnosis present

## 2013-04-21 DIAGNOSIS — K3184 Gastroparesis: Secondary | ICD-10-CM

## 2013-04-21 DIAGNOSIS — E109 Type 1 diabetes mellitus without complications: Secondary | ICD-10-CM

## 2013-04-21 DIAGNOSIS — E860 Dehydration: Secondary | ICD-10-CM

## 2013-04-21 DIAGNOSIS — K649 Unspecified hemorrhoids: Secondary | ICD-10-CM | POA: Diagnosis not present

## 2013-04-21 DIAGNOSIS — R1013 Epigastric pain: Secondary | ICD-10-CM

## 2013-04-21 DIAGNOSIS — Z7982 Long term (current) use of aspirin: Secondary | ICD-10-CM

## 2013-04-21 DIAGNOSIS — IMO0002 Reserved for concepts with insufficient information to code with codable children: Secondary | ICD-10-CM

## 2013-04-21 DIAGNOSIS — E1149 Type 2 diabetes mellitus with other diabetic neurological complication: Principal | ICD-10-CM | POA: Diagnosis present

## 2013-04-21 DIAGNOSIS — E7800 Pure hypercholesterolemia, unspecified: Secondary | ICD-10-CM | POA: Diagnosis present

## 2013-04-21 DIAGNOSIS — I69998 Other sequelae following unspecified cerebrovascular disease: Secondary | ICD-10-CM

## 2013-04-21 DIAGNOSIS — K5909 Other constipation: Secondary | ICD-10-CM | POA: Diagnosis present

## 2013-04-21 DIAGNOSIS — M3320 Polymyositis, organ involvement unspecified: Secondary | ICD-10-CM | POA: Diagnosis present

## 2013-04-21 DIAGNOSIS — I5023 Acute on chronic systolic (congestive) heart failure: Secondary | ICD-10-CM

## 2013-04-21 DIAGNOSIS — K5903 Drug induced constipation: Secondary | ICD-10-CM

## 2013-04-21 DIAGNOSIS — E1159 Type 2 diabetes mellitus with other circulatory complications: Secondary | ICD-10-CM | POA: Diagnosis present

## 2013-04-21 DIAGNOSIS — G4733 Obstructive sleep apnea (adult) (pediatric): Secondary | ICD-10-CM | POA: Diagnosis present

## 2013-04-21 DIAGNOSIS — I5032 Chronic diastolic (congestive) heart failure: Secondary | ICD-10-CM

## 2013-04-21 DIAGNOSIS — R1110 Vomiting, unspecified: Secondary | ICD-10-CM

## 2013-04-21 DIAGNOSIS — I509 Heart failure, unspecified: Secondary | ICD-10-CM | POA: Diagnosis present

## 2013-04-21 DIAGNOSIS — E2749 Other adrenocortical insufficiency: Secondary | ICD-10-CM | POA: Diagnosis present

## 2013-04-21 DIAGNOSIS — E119 Type 2 diabetes mellitus without complications: Secondary | ICD-10-CM

## 2013-04-21 DIAGNOSIS — M3500 Sicca syndrome, unspecified: Secondary | ICD-10-CM | POA: Diagnosis present

## 2013-04-21 DIAGNOSIS — K5900 Constipation, unspecified: Secondary | ICD-10-CM

## 2013-04-21 DIAGNOSIS — K7689 Other specified diseases of liver: Secondary | ICD-10-CM | POA: Diagnosis present

## 2013-04-21 DIAGNOSIS — Z794 Long term (current) use of insulin: Secondary | ICD-10-CM

## 2013-04-21 LAB — POCT I-STAT TROPONIN I

## 2013-04-21 LAB — BASIC METABOLIC PANEL
BUN: 6 mg/dL (ref 6–23)
Chloride: 104 mEq/L (ref 96–112)
Glucose, Bld: 171 mg/dL — ABNORMAL HIGH (ref 70–99)
Potassium: 3.8 mEq/L (ref 3.5–5.1)
Sodium: 141 mEq/L (ref 135–145)

## 2013-04-21 LAB — GLUCOSE, CAPILLARY: Glucose-Capillary: 189 mg/dL — ABNORMAL HIGH (ref 70–99)

## 2013-04-21 LAB — CBC
HCT: 28.2 % — ABNORMAL LOW (ref 36.0–46.0)
Hemoglobin: 9.3 g/dL — ABNORMAL LOW (ref 12.0–15.0)
RBC: 3.38 MIL/uL — ABNORMAL LOW (ref 3.87–5.11)
WBC: 9 10*3/uL (ref 4.0–10.5)

## 2013-04-21 LAB — HEPATIC FUNCTION PANEL
AST: 15 U/L (ref 0–37)
Albumin: 3.2 g/dL — ABNORMAL LOW (ref 3.5–5.2)
Bilirubin, Direct: 0.1 mg/dL (ref 0.0–0.3)
Total Bilirubin: 0.6 mg/dL (ref 0.3–1.2)

## 2013-04-21 LAB — LIPASE, BLOOD: Lipase: 19 U/L (ref 11–59)

## 2013-04-21 LAB — TROPONIN I: Troponin I: <0.3 ng/mL (ref <0.30)

## 2013-04-21 MED ORDER — HYDROMORPHONE HCL PF 1 MG/ML IJ SOLN
1.0000 mg | Freq: Once | INTRAMUSCULAR | Status: AC
  Administered 2013-04-21: 1 mg via INTRAVENOUS
  Filled 2013-04-21: qty 1

## 2013-04-21 MED ORDER — SODIUM CHLORIDE 0.9 % IV SOLN
INTRAVENOUS | Status: DC
  Administered 2013-04-21 – 2013-04-22 (×2): via INTRAVENOUS
  Administered 2013-04-22 – 2013-04-23 (×2): 1000 mL via INTRAVENOUS
  Administered 2013-04-24 – 2013-04-27 (×3): via INTRAVENOUS

## 2013-04-21 MED ORDER — METOPROLOL TARTRATE 1 MG/ML IV SOLN
5.0000 mg | Freq: Four times a day (QID) | INTRAVENOUS | Status: DC
  Administered 2013-04-22 (×2): 5 mg via INTRAVENOUS
  Filled 2013-04-21 (×5): qty 5

## 2013-04-21 MED ORDER — ASPIRIN EC 81 MG PO TBEC
81.0000 mg | DELAYED_RELEASE_TABLET | Freq: Every day | ORAL | Status: DC
  Administered 2013-04-22 – 2013-04-23 (×2): 81 mg via ORAL
  Filled 2013-04-21 (×4): qty 1

## 2013-04-21 MED ORDER — CLOPIDOGREL BISULFATE 75 MG PO TABS
75.0000 mg | ORAL_TABLET | Freq: Every day | ORAL | Status: DC
  Administered 2013-04-22 – 2013-04-27 (×6): 75 mg via ORAL
  Filled 2013-04-21 (×7): qty 1

## 2013-04-21 MED ORDER — FAMOTIDINE 20 MG PO TABS
20.0000 mg | ORAL_TABLET | Freq: Two times a day (BID) | ORAL | Status: DC
  Administered 2013-04-21 – 2013-04-26 (×10): 20 mg via ORAL
  Filled 2013-04-21 (×11): qty 1

## 2013-04-21 MED ORDER — SORBITOL 70 % SOLN: 30.0000 mL | Freq: Every day | Status: DC | PRN

## 2013-04-21 MED ORDER — LUBIPROSTONE 24 MCG PO CAPS
24.0000 ug | ORAL_CAPSULE | Freq: Two times a day (BID) | ORAL | Status: DC
  Administered 2013-04-22 – 2013-04-28 (×13): 24 ug via ORAL
  Filled 2013-04-21 (×16): qty 1

## 2013-04-21 MED ORDER — SORBITOL 70 % SOLN
30.0000 mL | Freq: Once | Status: DC
  Filled 2013-04-21: qty 30

## 2013-04-21 MED ORDER — INSULIN ASPART 100 UNIT/ML ~~LOC~~ SOLN
15.0000 [IU] | Freq: Three times a day (TID) | SUBCUTANEOUS | Status: DC
  Administered 2013-04-22 – 2013-04-26 (×12): 15 [IU] via SUBCUTANEOUS

## 2013-04-21 MED ORDER — POLYETHYLENE GLYCOL 3350 17 G PO PACK
17.0000 g | PACK | Freq: Every day | ORAL | Status: DC | PRN
  Filled 2013-04-21: qty 1

## 2013-04-21 MED ORDER — INSULIN REGULAR HUMAN (CONC) 500 UNIT/ML ~~LOC~~ SOLN: 15.0000 [IU] | Freq: Three times a day (TID) | SUBCUTANEOUS | Status: DC

## 2013-04-21 MED ORDER — INSULIN ASPART 100 UNIT/ML ~~LOC~~ SOLN
0.0000 [IU] | Freq: Three times a day (TID) | SUBCUTANEOUS | Status: DC
  Administered 2013-04-22 (×2): 7 [IU] via SUBCUTANEOUS

## 2013-04-21 MED ORDER — ALBUTEROL SULFATE HFA 108 (90 BASE) MCG/ACT IN AERS: 2.0000 | INHALATION_SPRAY | Freq: Four times a day (QID) | RESPIRATORY_TRACT | Status: DC | PRN

## 2013-04-21 MED ORDER — ONDANSETRON HCL 4 MG/2ML IJ SOLN
4.0000 mg | Freq: Once | INTRAMUSCULAR | Status: AC
  Administered 2013-04-21: 4 mg via INTRAVENOUS
  Filled 2013-04-21: qty 2

## 2013-04-21 MED ORDER — HYDRALAZINE HCL 20 MG/ML IJ SOLN: 5.0000 mg | Freq: Four times a day (QID) | INTRAMUSCULAR | Status: DC | PRN

## 2013-04-21 MED ORDER — METHYLPREDNISOLONE SODIUM SUCC 40 MG IJ SOLR: 40.0000 mg | Freq: Two times a day (BID) | INTRAMUSCULAR | Status: DC

## 2013-04-21 MED ORDER — SODIUM CHLORIDE 0.9 % IV BOLUS (SEPSIS)
500.0000 mL | Freq: Once | INTRAVENOUS | Status: AC
  Administered 2013-04-21: 500 mL via INTRAVENOUS

## 2013-04-21 MED ORDER — SUCRALFATE 1 GM/10ML PO SUSP
1.0000 g | Freq: Three times a day (TID) | ORAL | Status: DC
  Administered 2013-04-21 – 2013-04-26 (×18): 1 g via ORAL
  Filled 2013-04-21 (×22): qty 10

## 2013-04-21 MED ORDER — ONDANSETRON HCL 4 MG/2ML IJ SOLN: 4.0000 mg | Freq: Three times a day (TID) | INTRAMUSCULAR | Status: AC | PRN

## 2013-04-21 MED ORDER — MAGNESIUM CITRATE PO SOLN
1.0000 | Freq: Once | ORAL | Status: AC | PRN
  Filled 2013-04-21: qty 296

## 2013-04-21 MED ORDER — HYDROMORPHONE HCL PF 1 MG/ML IJ SOLN
0.5000 mg | INTRAMUSCULAR | Status: DC | PRN
  Administered 2013-04-21 – 2013-04-27 (×25): 0.5 mg via INTRAVENOUS
  Filled 2013-04-21 (×26): qty 1

## 2013-04-21 MED ORDER — DOCUSATE SODIUM 100 MG PO CAPS
100.0000 mg | ORAL_CAPSULE | Freq: Two times a day (BID) | ORAL | Status: DC
  Administered 2013-04-21 – 2013-04-27 (×12): 100 mg via ORAL
  Filled 2013-04-21 (×14): qty 1

## 2013-04-21 MED ORDER — HEPARIN SODIUM (PORCINE) 5000 UNIT/ML IJ SOLN
5000.0000 [IU] | Freq: Three times a day (TID) | INTRAMUSCULAR | Status: DC
  Administered 2013-04-21 – 2013-04-24 (×8): 5000 [IU] via SUBCUTANEOUS
  Filled 2013-04-21 (×11): qty 1

## 2013-04-21 MED ORDER — IBUPROFEN 400 MG PO TABS
400.0000 mg | ORAL_TABLET | ORAL | Status: DC | PRN
  Administered 2013-04-21: 400 mg via ORAL
  Filled 2013-04-21: qty 1

## 2013-04-21 MED ORDER — INSULIN ASPART 100 UNIT/ML ~~LOC~~ SOLN
0.0000 [IU] | Freq: Every day | SUBCUTANEOUS | Status: DC
  Administered 2013-04-22 – 2013-04-27 (×3): 2 [IU] via SUBCUTANEOUS

## 2013-04-21 MED ORDER — METHYLPREDNISOLONE SODIUM SUCC 125 MG IJ SOLR
60.0000 mg | Freq: Two times a day (BID) | INTRAMUSCULAR | Status: AC
  Administered 2013-04-22 – 2013-04-23 (×4): 60 mg via INTRAVENOUS
  Filled 2013-04-21 (×5): qty 0.96

## 2013-04-21 MED ORDER — METHYLPREDNISOLONE SODIUM SUCC 125 MG IJ SOLR
125.0000 mg | Freq: Once | INTRAMUSCULAR | Status: AC
  Administered 2013-04-21: 125 mg via INTRAVENOUS
  Filled 2013-04-21: qty 2

## 2013-04-21 NOTE — Progress Notes (Signed)
  Pt admitted to the unit. Pt is stable, alert and oriented per baseline. Oriented to room, staff, and call bell. Educated to call for any assistance. Bed in lowest position, call bell within reach- will continue to monitor. 

## 2013-04-21 NOTE — ED Notes (Signed)
Pt c/o generalized CP and body aches that is chronic in nature; pt sts recent N/V; pt seen here recently for same; pt sts hx of lupus and thinks could be lupus flare

## 2013-04-21 NOTE — H&P (Addendum)
[facility] History and Physical  [name] ZOX:[id number] DOB: September 05, 1970 DOA: 04/21/2013  Referring physician: Emergency Department PCP: Ron Parker, MD  Specialists:   Chief Complaint: Abdominal pain, nausea/vomiting  HPI: [name] is a 42 y.o. female  With a hx of lupus, chronic systolic chf, htn, diabetes, and a hx of prior CVA who presents to the ED with recurrent abdominal pain with nausea/vomiting. Reports last bowel movement was yesterday, but bowel movement prior to that was over one week ago. Pt reports not tolerating oral meds, including her lupus meds. Consequently, the patient has noted marked diffuse joint pains related to lupus flare. Given inability to tolerate PO, the hospitalist was consulted for possible admission.  Review of Systems:  As per above, remainder of 10pt ROS reviewed and are neg  Past Medical History  Diagnosis Date  . Bell's palsy 08/09/2010  . SLE 08/09/2010  . SJOGREN'S SYNDROME 08/09/2010  . Hypertension   . High cholesterol   . Anginal pain   . Obstructive sleep apnea on CPAP 2011  . Anxiety   . GERD (gastroesophageal reflux disease)   . CHF (congestive heart failure)   . Acute renal failure      Intractable nausea vomiting secondary to diabetic gastroparesis causing dehydration and acute renal failure /notes 04/01/2013  . Liver mass     biopsied 03/2013 at [facility], not malignant.  is to undergo radiologic ablation of the mass in sept/October 2014.   Marland Kitchen Pneumonia 2000's    "once" (04/01/2013)  . On home oxygen therapy     "2L 24/7" (04/01/2013)  . Diabetic gastroparesis associated with type 2 diabetes mellitus     this is presumed diagnoses, not confirmed by any studies.   . Migraines     "maybe a couple times/yr" (04/01/2013)  . Stroke 2010; 10/2012    "left side is still weak from it, never fully regained full strength; no additions from stroke 10/2012"  . Obesity    Past Surgical History  Procedure Laterality Date  .  Ectopic pregnancy surgery  1999  . Cardiac catheterization  02/07/12  . Hernia repair  1980's    umbilical  . Epidermoid cyst excision  2007     left breast  . Esophagogastroduodenoscopy N/A 02/26/2013    Procedure: ESOPHAGOGASTRODUODENOSCOPY (EGD);  Surgeon: Hilarie Fredrickson, MD;  Location: [facility] ENDOSCOPY;  Service: Endoscopy;  Laterality: N/A;  . Breast cyst excision Left 2007    epidermoid   Social History:  reports that she quit smoking about 22 years ago. Her smoking use included Cigarettes. She smoked 0.00 packs per day for 3 years. She has never used smokeless tobacco. She reports that she does not drink alcohol or use illicit drugs.  where does patient live--home, ALF, SNF? and with whom if at home?  Can patient participate in ADLs?  Allergies  Allergen Reactions  . Oxycodone-Acetaminophen Hives    FYI: no reaction to hydrocodone; tolerates dilaudid    Family History  Problem Relation Age of Onset  . Diabetes Mother   . Hypertension Other   . Stroke Other   . Arthritis Other     (be sure to complete)  Prior to Admission medications   Medication Sig Start Date End Date Taking? Authorizing Provider  albuterol (PROAIR HFA) 108 (90 BASE) MCG/ACT inhaler Inhale 2 puffs into the lungs every 6 (six) hours as needed. For shortness of breath/wheeze   Yes Historical Provider, MD  alendronate (FOSAMAX) 70 MG tablet Take 70 mg by  mouth every 7 (seven) days. thursdays. Take with a full glass of water on an empty stomach.   Yes Historical Provider, MD  ALPRAZolam Prudy Feeler) 0.5 MG tablet Take 0.5 mg by mouth 2 (two) times daily as needed for sleep or anxiety.    Yes Historical Provider, MD  aspirin EC 81 MG tablet Take 81 mg by mouth daily.   Yes Historical Provider, MD  azaTHIOprine (IMURAN) 50 MG tablet Take 50 mg by mouth 2 (two) times daily.    Yes Historical Provider, MD  buPROPion (WELLBUTRIN SR) 100 MG 12 hr tablet Take 1 tablet (100 mg total) by mouth 2 (two) times daily. 04/06/13  Yes Belkys  A Regalado, MD  carvedilol (COREG) 3.125 MG tablet Take 3.125 mg by mouth 2 (two) times daily with a meal.   Yes Historical Provider, MD  cetirizine (ZYRTEC) 10 MG tablet Take 10 mg by mouth daily.   Yes Historical Provider, MD  citalopram (CELEXA) 10 MG tablet Take 1 tablet (10 mg total) by mouth every evening. 04/06/13  Yes Belkys A Regalado, MD  clopidogrel (PLAVIX) 75 MG tablet Take 75 mg by mouth daily.   Yes Historical Provider, MD  cycloSPORINE (RESTASIS) 0.05 % ophthalmic emulsion Place 1 drop into both eyes 3 (three) times daily.   Yes Historical Provider, MD  enoxaparin (LOVENOX) 40 MG/0.4ML injection Inject 1.6 mLs (160 mg total) into the skin daily. 04/19/13  Yes Candyce Churn, MD  famotidine (PEPCID) 20 MG tablet Take 20 mg by mouth 2 (two) times daily.   Yes Historical Provider, MD  gabapentin (NEURONTIN) 300 MG capsule Take 300 mg by mouth 2 (two) times daily.    Yes Historical Provider, MD  hydroxychloroquine (PLAQUENIL) 200 MG tablet Take 200 mg by mouth 2 (two) times daily.    Yes Historical Provider, MD  insulin regular human CONCENTRATED (HUMULIN R) 500 UNIT/ML SOLN injection Inject 0.03 mLs (15 Units total) into the skin 3 (three) times daily with meals. Please draw up as though it is just 3 units at a time. 04/15/13  Yes Romero Belling, MD  isosorbide-hydrALAZINE (BIDIL) 20-37.5 MG per tablet Take 1 tablet by mouth 3 (three) times daily.   Yes Historical Provider, MD  Multiple Vitamin (MULTIVITAMIN WITH MINERALS) TABS tablet Take 1 tablet by mouth daily.   Yes Historical Provider, MD  ondansetron (ZOFRAN) 4 MG tablet Take 1 tablet (4 mg total) by mouth every 6 (six) hours as needed for nausea. 04/06/13  Yes Belkys A Regalado, MD  oxyCODONE-acetaminophen (PERCOCET/ROXICET) 5-325 MG per tablet Take 1 tablet by mouth every 4 (four) hours as needed for pain.   Yes Historical Provider, MD  polyethylene glycol (MIRALAX / GLYCOLAX) packet Take 17 g by mouth 2 (two) times daily. 03/01/13  Yes  Tora Kindred York, PA-C  Potassium Chloride Crys CR (KLOR-CON M20 PO) Take 20 mEq by mouth 2 (two) times daily.    Yes Historical Provider, MD  predniSONE (DELTASONE) 1 MG tablet Take 4 mg by mouth daily.   Yes Historical Provider, MD  senna (SENOKOT) 8.6 MG TABS tablet Take 2 tablets by mouth daily as needed (constipation).  03/01/13  Yes Tora Kindred York, PA-C  torsemide (DEMADEX) 20 MG tablet Take 1 tablet (20 mg total) by mouth 2 (two) times daily. 04/06/13  Yes Belkys A Regalado, MD  traZODone (DESYREL) 50 MG tablet Take 1 tablet (50 mg total) by mouth at bedtime. 04/06/13  Yes Belkys A Regalado, MD  sucralfate (CARAFATE) 1 GM/10ML suspension  Take 10 mLs (1 g total) by mouth 4 (four) times daily -  with meals and at bedtime. 04/06/13   Alba Cory, MD   Physical Exam: Filed Vitals:   04/21/13 1316 04/21/13 1450  BP: 156/80 136/64  Pulse: 94 88  Temp: 99.2 F (37.3 C)   TempSrc: Oral   Resp: 18 14  SpO2: 99% 100%     General:  Awake, appears to be in mild discomfort  Eyes: PERRL B  ENT: membranes moist, dentition fair  Neck: trachea midline, neck supple  Cardiovascular: regular, s1, s2  Respiratory: normal resp effort, no wheezing  Abdomen: obese, decreased BS, distended  Skin: perfused, no abnormal skin lesions seen  Musculoskeletal: perfused, no clubbing, tenderness across PIP and DIP joints  Psychiatric: mood/affect normal // no auditory/visual hallucinations  Neurologic: cn2-12 grossly intact, strength/sensation intact  Labs on Admission:  Basic Metabolic Panel:  Recent Labs Lab 04/18/13 2235 04/21/13 1319  NA 138 141  K 3.3* 3.8  CL 98 104  CO2 31 29  GLUCOSE 148* 171*  BUN 12 6  CREATININE 1.12* 0.89  CALCIUM 8.9 9.2   Liver Function Tests: No results found for this basename: AST, ALT, ALKPHOS, BILITOT, PROT, ALBUMIN,  in the last 168 hours  Recent Labs Lab 04/18/13 2235  LIPASE 40   No results found for this basename: AMMONIA,  in the last 168  hours CBC:  Recent Labs Lab 04/18/13 2235 04/21/13 1319  WBC 12.8* 9.0  HGB 8.9* 9.3*  HCT 27.5* 28.2*  MCV 83.6 83.4  PLT 260 248   Cardiac Enzymes:  Recent Labs Lab 04/21/13 1613  TROPONINI <0.30    BNP (last 3 results)  Recent Labs  02/25/13 1918 04/01/13 1228 04/18/13 2235  PROBNP 162.5* 29.6 231.5*   CBG: No results found for this basename: GLUCAP,  in the last 168 hours  Radiological Exams on Admission: Dg Chest 2 View  04/21/2013   *RADIOLOGY REPORT*  Clinical Data: Chest pain  CHEST - 2 VIEW  Comparison: April 19, 2013  Findings: There is no focal infiltrate, pulmonary edema, or pleural effusion.  The mediastinal contour is normal.  The heart size is probably mildly enlarged.  The soft tissues and osseous structures are stable.  IMPRESSION: No acute cardiopulmonary disease identified.   Original Report Authenticated By: Sherian Rein, M.D.     Assessment/Plan Principal Problem:   Constipation due to pain medication Active Problems:   IDDM   SLE   DM (diabetes mellitus)   HTN (hypertension)   Systolic CHF, acute on chronic   Abdominal pain, epigastric   1. Abdominal pain 1. Marked amounts of stool noted on 9/19 abd CT per my read 2. Patient reports no significant BM in over one week 3. Sorbitol ordered through the Ed 4. Consider a tiral of amitiza for chronic constipation 5. May consider alternative cathartics as needed 6. For now, clear liquid diet 7. Admit to med floor, obs 2. SLE 1. Suspected lupus flare 2. Unable to tolerate PO meds 3. Will continue on IV steroids for now until pt is able to tolerate her home immuno modulators 3. DM 1. Cont home insulin with addition of SSI coverge 4. Chronic systolic chf 1. Last documented EF 55-60% in 3/14 2. Compensated presently 5. HTN 1. BP stable 2. Cont on scheduled IV beta blocker with PRN hydralazine until patient is able to tolerate PO meds 6. Hx Liver lesion 1. Notes from previous  admission reviewed. Lesions determined to  be benign. Followed by Dr. Braulio Bosch, [name] at [facility] 7. DVT prophylaxis 1. Heparin subQ  Code Status: Full (must indicate code status--if unknown or must be presumed, indicate so) Family Communication: Pt in room (indicate person spoken with, if applicable, with phone number if by telephone) Disposition Plan: Pending (indicate anticipated LOS)  Time spent:  CHIU, STEPHEN K [facility] Pager [phone]  If 7PM-7AM, please contact night-coverage [email or web address] Password [facility] 04/21/2013, 5:26 PM

## 2013-04-21 NOTE — Progress Notes (Signed)
MEDICATION RELATED NOTE   Pharmacy re:  Insulin  Assessment: 42 yo female with DM and is receiving U-500 concentrated regular insulin at a dose of 15 units three times daily - I have confirmed this with her, although she remembers it as the amount she draws up in her syringe as 3 units and not 0.48ml.  She does not have her supply here at the hospital and since her dose is less than 100 units, I have spoken with Dr. Claiborne Billings and we will convert her to our Novolog U-100 while she is in the hospital at the same dose she is currently on.  If any titration in her dose is needed, simply go up by the number of units you wish to increase.  Goal of Therapy:  CBG < 180  Plan:  1.  Novolog 15 units three times daily - will need to remember to resume her U-500 at discharge.  Medications:  Prescriptions prior to admission  Medication Sig Dispense Refill  . albuterol (PROAIR HFA) 108 (90 BASE) MCG/ACT inhaler Inhale 2 puffs into the lungs every 6 (six) hours as needed. For shortness of breath/wheeze      . alendronate (FOSAMAX) 70 MG tablet Take 70 mg by mouth every 7 (seven) days. thursdays. Take with a full glass of water on an empty stomach.      . ALPRAZolam (XANAX) 0.5 MG tablet Take 0.5 mg by mouth 2 (two) times daily as needed for sleep or anxiety.       Marland Kitchen aspirin EC 81 MG tablet Take 81 mg by mouth daily.      Marland Kitchen azaTHIOprine (IMURAN) 50 MG tablet Take 50 mg by mouth 2 (two) times daily.       Marland Kitchen buPROPion (WELLBUTRIN SR) 100 MG 12 hr tablet Take 1 tablet (100 mg total) by mouth 2 (two) times daily.  30 tablet  0  . carvedilol (COREG) 3.125 MG tablet Take 3.125 mg by mouth 2 (two) times daily with a meal.      . cetirizine (ZYRTEC) 10 MG tablet Take 10 mg by mouth daily.      . citalopram (CELEXA) 10 MG tablet Take 1 tablet (10 mg total) by mouth every evening.  30 tablet  0  . clopidogrel (PLAVIX) 75 MG tablet Take 75 mg by mouth daily.      . cycloSPORINE (RESTASIS) 0.05 % ophthalmic emulsion  Place 1 drop into both eyes 3 (three) times daily.      Marland Kitchen enoxaparin (LOVENOX) 40 MG/0.4ML injection Inject 1.6 mLs (160 mg total) into the skin daily.  12 Syringe  0  . famotidine (PEPCID) 20 MG tablet Take 20 mg by mouth 2 (two) times daily.      Marland Kitchen gabapentin (NEURONTIN) 300 MG capsule Take 300 mg by mouth 2 (two) times daily.       . hydroxychloroquine (PLAQUENIL) 200 MG tablet Take 200 mg by mouth 2 (two) times daily.       . insulin regular human CONCENTRATED (HUMULIN R) 500 UNIT/ML SOLN injection Inject 0.03 mLs (15 Units total) into the skin 3 (three) times daily with meals. Please draw up as though it is just 3 units at a time.  20 mL  5  . isosorbide-hydrALAZINE (BIDIL) 20-37.5 MG per tablet Take 1 tablet by mouth 3 (three) times daily.      . Multiple Vitamin (MULTIVITAMIN WITH MINERALS) TABS tablet Take 1 tablet by mouth daily.      . ondansetron (ZOFRAN) 4  MG tablet Take 1 tablet (4 mg total) by mouth every 6 (six) hours as needed for nausea.  20 tablet  0  . oxyCODONE-acetaminophen (PERCOCET/ROXICET) 5-325 MG per tablet Take 1 tablet by mouth every 4 (four) hours as needed for pain.      . polyethylene glycol (MIRALAX / GLYCOLAX) packet Take 17 g by mouth 2 (two) times daily.  14 each  0  . Potassium Chloride Crys CR (KLOR-CON M20 PO) Take 20 mEq by mouth 2 (two) times daily.       . predniSONE (DELTASONE) 1 MG tablet Take 4 mg by mouth daily.      Marland Kitchen senna (SENOKOT) 8.6 MG TABS tablet Take 2 tablets by mouth daily as needed (constipation).       . torsemide (DEMADEX) 20 MG tablet Take 1 tablet (20 mg total) by mouth 2 (two) times daily.  30 tablet  0  . traZODone (DESYREL) 50 MG tablet Take 1 tablet (50 mg total) by mouth at bedtime.  30 tablet  0  . sucralfate (CARAFATE) 1 GM/10ML suspension Take 10 mLs (1 g total) by mouth 4 (four) times daily -  with meals and at bedtime.  420 mL  0   Nadara Mustard, PharmD., MS Clinical Pharmacist Pager:  [phone] Thank you for allowing  pharmacy to be part of this patients care team. 04/21/2013,8:57 PM

## 2013-04-21 NOTE — ED Provider Notes (Addendum)
CSN: [id number]     Arrival date & time 04/21/13  1305 History   First MD Initiated Contact with Patient 04/21/13 1452     Chief Complaint  Patient presents with  . Chest Pain  . Emesis  . Generalized Body Aches   (Consider location/radiation/quality/duration/timing/severity/associated sxs/prior Treatment) HPI Comments: 42 yo female with lupus, CHF, htn, anemia, sjogens, followed at [facility] for lupus, CVA, obesity, home O2 presents with recurrent n/v and diffuse body pain for over a week.  Nothing improves, unable to keep medicines down at home. Supposed to be on prednisone and other medicine for lupus but cannot keep down.  Decreased intake.   Low grade fevers at home.  Chest pain ache, similar to body pain, non radiating.  Pain constant, worse with palpation. Tried vicodin, no improvement  Patient is a 42 y.o. female presenting with chest pain and vomiting. The history is provided by the patient.  Chest Pain Associated symptoms: back pain, fatigue, fever, nausea and vomiting   Associated symptoms: no abdominal pain, no cough, no headache, no shortness of breath and no weakness   Emesis Associated symptoms: no abdominal pain, no chills and no headaches     Past Medical History  Diagnosis Date  . Bell's palsy 08/09/2010  . SLE 08/09/2010  . SJOGREN'S SYNDROME 08/09/2010  . Hypertension   . High cholesterol   . Anginal pain   . Obstructive sleep apnea on CPAP 2011  . Anxiety   . GERD (gastroesophageal reflux disease)   . CHF (congestive heart failure)   . Acute renal failure      Intractable nausea vomiting secondary to diabetic gastroparesis causing dehydration and acute renal failure /notes 04/01/2013  . Liver mass     biopsied 03/2013 at [facility], not malignant.  is to undergo radiologic ablation of the mass in sept/October 2014.   Marland Kitchen Pneumonia 2000's    "once" (04/01/2013)  . On home oxygen therapy     "2L 24/7" (04/01/2013)  . Diabetic gastroparesis associated with type 2  diabetes mellitus     this is presumed diagnoses, not confirmed by any studies.   . Migraines     "maybe a couple times/yr" (04/01/2013)  . Stroke 2010; 10/2012    "left side is still weak from it, never fully regained full strength; no additions from stroke 10/2012"  . Obesity    Past Surgical History  Procedure Laterality Date  . Ectopic pregnancy surgery  1999  . Cardiac catheterization  02/07/12  . Hernia repair  1980's    umbilical  . Epidermoid cyst excision  2007     left breast  . Esophagogastroduodenoscopy N/A 02/26/2013    Procedure: ESOPHAGOGASTRODUODENOSCOPY (EGD);  Surgeon: Hilarie Fredrickson, MD;  Location: [facility] ENDOSCOPY;  Service: Endoscopy;  Laterality: N/A;  . Breast cyst excision Left 2007    epidermoid   Family History  Problem Relation Age of Onset  . Diabetes Mother   . Hypertension Other   . Stroke Other   . Arthritis Other    History  Substance Use Topics  . Smoking status: Former Smoker -- 3 years    Types: Cigarettes    Quit date: 06/01/1990  . Smokeless tobacco: Never Used  . Alcohol Use: No   OB History   Grav Para Term Preterm Abortions TAB SAB Ect Mult Living   2 1 1       1      Review of Systems  Constitutional: Positive for fever, appetite change and  fatigue. Negative for chills.  HENT: Negative for neck pain and neck stiffness.   Eyes: Negative for visual disturbance.  Respiratory: Negative for cough and shortness of breath.   Cardiovascular: Positive for chest pain.  Gastrointestinal: Positive for nausea and vomiting. Negative for abdominal pain and blood in stool.  Genitourinary: Negative for dysuria and flank pain.  Musculoskeletal: Positive for back pain.  Skin: Negative for rash.  Neurological: Positive for light-headedness. Negative for weakness and headaches.    Allergies  Oxycodone-acetaminophen  Home Medications   Current Outpatient Rx  Name  Route  Sig  Dispense  Refill  . albuterol (PROAIR HFA) 108 (90 BASE) MCG/ACT inhaler    Inhalation   Inhale 2 puffs into the lungs every 6 (six) hours as needed. For shortness of breath/wheeze         . alendronate (FOSAMAX) 70 MG tablet   Oral   Take 70 mg by mouth every 7 (seven) days. thursdays. Take with a full glass of water on an empty stomach.         . ALPRAZolam (XANAX) 0.5 MG tablet   Oral   Take 0.5 mg by mouth 2 (two) times daily as needed for sleep or anxiety.          Marland Kitchen aspirin EC 81 MG tablet   Oral   Take 81 mg by mouth daily.         Marland Kitchen azaTHIOprine (IMURAN) 50 MG tablet   Oral   Take 50 mg by mouth 2 (two) times daily.          Marland Kitchen buPROPion (WELLBUTRIN SR) 100 MG 12 hr tablet   Oral   Take 1 tablet (100 mg total) by mouth 2 (two) times daily.   30 tablet   0   . carvedilol (COREG) 3.125 MG tablet   Oral   Take 3.125 mg by mouth 2 (two) times daily with a meal.         . cetirizine (ZYRTEC) 10 MG tablet   Oral   Take 10 mg by mouth daily.         . citalopram (CELEXA) 10 MG tablet   Oral   Take 1 tablet (10 mg total) by mouth every evening.   30 tablet   0   . clopidogrel (PLAVIX) 75 MG tablet   Oral   Take 75 mg by mouth daily.         . cycloSPORINE (RESTASIS) 0.05 % ophthalmic emulsion   Both Eyes   Place 1 drop into both eyes 3 (three) times daily.         Marland Kitchen enoxaparin (LOVENOX) 40 MG/0.4ML injection   Subcutaneous   Inject 1.6 mLs (160 mg total) into the skin daily.   12 Syringe   0     Dispense twelve 80mg  syringes.  Enough for six dos ...   . famotidine (PEPCID) 20 MG tablet   Oral   Take 20 mg by mouth 2 (two) times daily.         Marland Kitchen gabapentin (NEURONTIN) 300 MG capsule   Oral   Take 300 mg by mouth 2 (two) times daily.          . hydroxychloroquine (PLAQUENIL) 200 MG tablet   Oral   Take 200 mg by mouth 2 (two) times daily.          . insulin regular human CONCENTRATED (HUMULIN R) 500 UNIT/ML SOLN injection   Subcutaneous   Inject 0.03 mLs (15  Units total) into the skin 3 (three) times  daily with meals. Please draw up as though it is just 3 units at a time.   20 mL   5   . isosorbide-hydrALAZINE (BIDIL) 20-37.5 MG per tablet   Oral   Take 1 tablet by mouth 3 (three) times daily.         . Multiple Vitamin (MULTIVITAMIN WITH MINERALS) TABS tablet   Oral   Take 1 tablet by mouth daily.         . ondansetron (ZOFRAN) 4 MG tablet   Oral   Take 1 tablet (4 mg total) by mouth every 6 (six) hours as needed for nausea.   20 tablet   0   . oxyCODONE-acetaminophen (PERCOCET/ROXICET) 5-325 MG per tablet   Oral   Take 1 tablet by mouth every 4 (four) hours as needed for pain.         . polyethylene glycol (MIRALAX / GLYCOLAX) packet   Oral   Take 17 g by mouth 2 (two) times daily.   14 each   0   . Potassium Chloride Crys CR (KLOR-CON M20 PO)   Oral   Take 20 mEq by mouth 2 (two) times daily.          . predniSONE (DELTASONE) 1 MG tablet   Oral   Take 4 mg by mouth daily.         Marland Kitchen senna (SENOKOT) 8.6 MG TABS tablet   Oral   Take 2 tablets by mouth daily as needed (constipation).          . torsemide (DEMADEX) 20 MG tablet   Oral   Take 1 tablet (20 mg total) by mouth 2 (two) times daily.   30 tablet   0   . traZODone (DESYREL) 50 MG tablet   Oral   Take 1 tablet (50 mg total) by mouth at bedtime.   30 tablet   0   . sucralfate (CARAFATE) 1 GM/10ML suspension   Oral   Take 10 mLs (1 g total) by mouth 4 (four) times daily -  with meals and at bedtime.   420 mL   0    BP 136/64  Pulse 88  Temp(Src) 99.2 F (37.3 C) (Oral)  Resp 14  SpO2 100% Physical Exam  Nursing note and vitals reviewed. Constitutional: She is oriented to person, place, and time. She appears well-developed and well-nourished.  HENT:  Head: Normocephalic and atraumatic.  Dry mm  Eyes: Conjunctivae are normal. Right eye exhibits no discharge. Left eye exhibits no discharge.  Neck: Normal range of motion. Neck supple. No tracheal deviation present.   Cardiovascular: Normal rate and regular rhythm.   Pulmonary/Chest: Effort normal and breath sounds normal.  Abdominal: Soft. She exhibits no distension. There is no tenderness. There is no guarding.  Musculoskeletal: She exhibits tenderness. She exhibits no edema.  Neurological: She is alert and oriented to person, place, and time.  Skin: Skin is warm. No rash noted.  Psychiatric: She has a normal mood and affect.    ED Course  Procedures (including critical care time) Labs Review Labs Reviewed  CBC - Abnormal; Notable for the following:    RBC 3.38 (*)    Hemoglobin 9.3 (*)    HCT 28.2 (*)    RDW 16.8 (*)    All other components within normal limits  BASIC METABOLIC PANEL - Abnormal; Notable for the following:    Glucose, Bld 171 (*)  GFR calc non Af Amer 79 (*)    All other components within normal limits  TROPONIN I  URINALYSIS, ROUTINE W REFLEX MICROSCOPIC  POCT I-STAT TROPONIN I   Imaging Review Dg Chest 2 View  04/21/2013   *RADIOLOGY REPORT*  Clinical Data: Chest pain  CHEST - 2 VIEW  Comparison: April 19, 2013  Findings: There is no focal infiltrate, pulmonary edema, or pleural effusion.  The mediastinal contour is normal.  The heart size is probably mildly enlarged.  The soft tissues and osseous structures are stable.  IMPRESSION: No acute cardiopulmonary disease identified.   Original Report Authenticated By: Sherian Rein, M.D.    MDM  No diagnosis found. Diffuse body pain.  No acute chest pain.  Clinically systemic pain likely lupus related, unable to keep meds down. Pain med, fluids and antiemetics in ED.  With recurrent visits plan for observation in hospital.   Vomiting, likely gastropresis, similar to previous. Steroids given.  Date: 04/21/2013  Rate: 94  Rhythm: normal sinus rhythm  QRS Axis: indeterminate  Intervals: normal  ST/T Wave abnormalities: nonspecific T wave changes  Conduction Disutrbances:none  Narrative Interpretation:   Old EKG  Reviewed: changes noted more pronounced t wave inversions lateral  Minimal improvement on recheck.  Admitted for observation, spoke with hospitalist, recommended sorbitol   Vomiting, Dehydration, Nausea, Anemia, failed outpt therapy, Abd pain, Lupus Flair, Constipation  Enid Skeens, MD 04/22/13 1610  Enid Skeens, MD 04/22/13 2223

## 2013-04-21 NOTE — Progress Notes (Signed)
Pt would like something for pain. RN contacted NP through [employer]. Awaiting orders.

## 2013-04-22 DIAGNOSIS — I5032 Chronic diastolic (congestive) heart failure: Secondary | ICD-10-CM

## 2013-04-22 DIAGNOSIS — R1110 Vomiting, unspecified: Secondary | ICD-10-CM

## 2013-04-22 DIAGNOSIS — R109 Unspecified abdominal pain: Secondary | ICD-10-CM

## 2013-04-22 LAB — URINE MICROSCOPIC-ADD ON

## 2013-04-22 LAB — URINALYSIS, ROUTINE W REFLEX MICROSCOPIC
Ketones, ur: 40 mg/dL — AB
Nitrite: NEGATIVE
Specific Gravity, Urine: 1.029 (ref 1.005–1.030)
Urobilinogen, UA: 1 mg/dL (ref 0.0–1.0)
pH: 6 (ref 5.0–8.0)

## 2013-04-22 LAB — CBC
HCT: 27.9 % — ABNORMAL LOW (ref 36.0–46.0)
MCH: 27.4 pg (ref 26.0–34.0)
MCV: 83 fL (ref 78.0–100.0)
RBC: 3.36 MIL/uL — ABNORMAL LOW (ref 3.87–5.11)
RDW: 16.7 % — ABNORMAL HIGH (ref 11.5–15.5)
WBC: 8.9 10*3/uL (ref 4.0–10.5)

## 2013-04-22 LAB — BASIC METABOLIC PANEL
BUN: 9 mg/dL (ref 6–23)
CO2: 25 mEq/L (ref 19–32)
Chloride: 102 mEq/L (ref 96–112)
GFR calc Af Amer: >90 mL/min (ref >90)
GFR calc non Af Amer: 83 mL/min — ABNORMAL LOW (ref >90)
Potassium: 4.3 mEq/L (ref 3.5–5.1)
Sodium: 137 mEq/L (ref 135–145)

## 2013-04-22 LAB — GLUCOSE, CAPILLARY
Glucose-Capillary: 221 mg/dL — ABNORMAL HIGH (ref 70–99)
Glucose-Capillary: 284 mg/dL — ABNORMAL HIGH (ref 70–99)
Glucose-Capillary: 227 mg/dL — ABNORMAL HIGH (ref 70–99)

## 2013-04-22 MED ORDER — INSULIN ASPART 100 UNIT/ML ~~LOC~~ SOLN
0.0000 [IU] | Freq: Three times a day (TID) | SUBCUTANEOUS | Status: DC
  Administered 2013-04-22 – 2013-04-23 (×2): 8 [IU] via SUBCUTANEOUS
  Administered 2013-04-23 (×2): 11 [IU] via SUBCUTANEOUS
  Administered 2013-04-24: 5 [IU] via SUBCUTANEOUS
  Administered 2013-04-24: 2 [IU] via SUBCUTANEOUS
  Administered 2013-04-24 – 2013-04-25 (×3): 5 [IU] via SUBCUTANEOUS
  Administered 2013-04-26: 11 [IU] via SUBCUTANEOUS
  Administered 2013-04-26: 3 [IU] via SUBCUTANEOUS
  Administered 2013-04-27 (×2): 5 [IU] via SUBCUTANEOUS

## 2013-04-22 MED ORDER — CARVEDILOL 3.125 MG PO TABS
3.1250 mg | ORAL_TABLET | Freq: Two times a day (BID) | ORAL | Status: DC
  Administered 2013-04-22 – 2013-04-28 (×12): 3.125 mg via ORAL
  Filled 2013-04-22 (×14): qty 1

## 2013-04-22 MED ORDER — INSULIN ASPART 100 UNIT/ML ~~LOC~~ SOLN: 0.0000 [IU] | Freq: Three times a day (TID) | SUBCUTANEOUS | Status: DC

## 2013-04-22 MED ORDER — SORBITOL 70 % SOLN
30.0000 mL | Freq: Every day | Status: DC
  Administered 2013-04-22 – 2013-04-26 (×4): 30 mL via ORAL
  Filled 2013-04-22 (×5): qty 30

## 2013-04-22 MED ORDER — INSULIN GLARGINE 100 UNIT/ML ~~LOC~~ SOLN
10.0000 [IU] | SUBCUTANEOUS | Status: DC
  Administered 2013-04-23 – 2013-04-24 (×2): 10 [IU] via SUBCUTANEOUS
  Filled 2013-04-22 (×3): qty 0.1

## 2013-04-22 MED ORDER — ONDANSETRON HCL 4 MG/2ML IJ SOLN
4.0000 mg | Freq: Three times a day (TID) | INTRAMUSCULAR | Status: DC | PRN
  Administered 2013-04-22 – 2013-04-26 (×11): 4 mg via INTRAVENOUS
  Filled 2013-04-22 (×11): qty 2

## 2013-04-22 MED ORDER — AZATHIOPRINE 50 MG PO TABS
100.0000 mg | ORAL_TABLET | Freq: Two times a day (BID) | ORAL | Status: DC
  Administered 2013-04-22 – 2013-04-27 (×11): 100 mg via ORAL
  Filled 2013-04-22 (×13): qty 2

## 2013-04-22 MED ORDER — AZATHIOPRINE 50 MG PO TABS
50.0000 mg | ORAL_TABLET | Freq: Two times a day (BID) | ORAL | Status: DC
  Filled 2013-04-22: qty 1

## 2013-04-22 MED ORDER — BUPROPION HCL ER (SR) 100 MG PO TB12
100.0000 mg | ORAL_TABLET | Freq: Two times a day (BID) | ORAL | Status: DC
  Filled 2013-04-22 (×2): qty 1

## 2013-04-22 MED ORDER — MAGNESIUM HYDROXIDE 400 MG/5ML PO SUSP: 15.0000 mL | Freq: Every day | ORAL | Status: DC | PRN

## 2013-04-22 MED ORDER — CITALOPRAM HYDROBROMIDE 10 MG PO TABS
10.0000 mg | ORAL_TABLET | Freq: Every evening | ORAL | Status: DC
  Administered 2013-04-22 – 2013-04-27 (×6): 10 mg via ORAL
  Filled 2013-04-22 (×7): qty 1

## 2013-04-22 MED ORDER — BOOST / RESOURCE BREEZE PO LIQD
1.0000 | Freq: Two times a day (BID) | ORAL | Status: DC
  Administered 2013-04-22 – 2013-04-27 (×10): 1 via ORAL
  Filled 2013-04-22: qty 1

## 2013-04-22 MED ORDER — HYDROXYCHLOROQUINE SULFATE 200 MG PO TABS
200.0000 mg | ORAL_TABLET | Freq: Two times a day (BID) | ORAL | Status: DC
  Administered 2013-04-22 – 2013-04-27 (×11): 200 mg via ORAL
  Filled 2013-04-22 (×14): qty 1

## 2013-04-22 MED ORDER — POLYETHYLENE GLYCOL 3350 17 G PO PACK
17.0000 g | PACK | Freq: Two times a day (BID) | ORAL | Status: DC
  Administered 2013-04-22 – 2013-04-27 (×11): 17 g via ORAL
  Filled 2013-04-22 (×14): qty 1

## 2013-04-22 MED ORDER — PROMETHAZINE HCL 25 MG/ML IJ SOLN
12.5000 mg | INTRAMUSCULAR | Status: DC | PRN
  Administered 2013-04-22 – 2013-04-23 (×3): 12.5 mg via INTRAVENOUS
  Filled 2013-04-22 (×3): qty 1

## 2013-04-22 MED ORDER — ALPRAZOLAM 0.5 MG PO TABS
0.5000 mg | ORAL_TABLET | Freq: Two times a day (BID) | ORAL | Status: DC | PRN
  Administered 2013-04-27: 0.5 mg via ORAL
  Filled 2013-04-22: qty 1

## 2013-04-22 NOTE — Progress Notes (Signed)
INITIAL NUTRITION ASSESSMENT  DOCUMENTATION CODES Per approved criteria  -Morbid Obesity   INTERVENTION: 1. Resource Breeze po BID, each supplement provides 250 kcal and 9 grams of protein.   NUTRITION DIAGNOSIS: Inadequate oral intake related to N/V as evidenced by pt report.   Goal: PO intake to meet >/=90% estimated nutrition needs.   Monitor:  Po intake, weight trends, labs, I/O's  Reason for Assessment: Malnutrition Screening Tool  42 y.o. female  Admitting Dx: Constipation due to pain medication  ASSESSMENT: Pt admitted with N/V with abdominal pain. Pt reported no bowel movement for over a week PTA. Pain/vomiting likely related to constipation.   Pt states that she has been slowly losing weight related to diet change and increased physical activity. Though in the past month has had poor appetite and difficulty with n/v with increased rate of weight loss.   Weight loss of 37 lbs in 6 months (9% body weight) not significant given time frame.   Height: Ht Readings from Last 1 Encounters:  04/21/13 5\' 8"  (1.727 m)    Weight: Wt Readings from Last 1 Encounters:  04/22/13 371 lb 8 oz (168.511 kg)    Ideal Body Weight: 140 lbs   % Ideal Body Weight: 265%  Wt Readings from Last 10 Encounters:  04/22/13 371 lb 8 oz (168.511 kg)  04/15/13 387 lb (175.542 kg)  04/06/13 376 lb 1.4 oz (170.592 kg)  03/11/13 383 lb (173.728 kg)  03/04/13 399 lb 14.6 oz (181.4 kg)  03/04/13 399 lb 14.6 oz (181.4 kg)  02/20/13 391 lb 8.6 oz (177.6 kg)  02/18/13 397 lb (180.078 kg)  11/19/12 409 lb (185.521 kg)  10/09/12 407 lb 13.6 oz (185 kg)    Usual Body Weight: 427 lbs per pt report   % Usual Body Weight: 87%  BMI:  Body mass index is 56.5 kg/(m^2). obesity class 3, extreme   Estimated Nutritional Needs: Kcal: 2200-2400 Protein: 80-90 gm  Fluid: 2.2-2.4 L   Skin: intact  Diet Order: Full Liquid  EDUCATION NEEDS: -No education needs identified at this  time   Intake/Output Summary (Last 24 hours) at 04/22/13 1358 Last data filed at 04/22/13 0900  Gross per 24 hour  Intake 1427.5 ml  Output      0 ml  Net 1427.5 ml    Last BM: PTA   Labs:   Recent Labs Lab 04/18/13 2235 04/21/13 1319 04/22/13 0619  NA 138 141 137  K 3.3* 3.8 4.3  CL 98 104 102  CO2 31 29 25   BUN 12 6 9   CREATININE 1.12* 0.89 0.85  CALCIUM 8.9 9.2 9.4  GLUCOSE 148* 171* 252*    CBG (last 3)   Recent Labs  04/21/13 2115 04/22/13 0747 04/22/13 1129  GLUCAP 189* 221* 225*    Scheduled Meds: . aspirin EC  81 mg Oral Daily  . azaTHIOprine  50 mg Oral BID  . buPROPion  100 mg Oral BID  . carvedilol  3.125 mg Oral BID WC  . citalopram  10 mg Oral QPM  . clopidogrel  75 mg Oral Daily  . docusate sodium  100 mg Oral BID  . famotidine  20 mg Oral BID  . heparin  5,000 Units Subcutaneous Q8H  . hydroxychloroquine  200 mg Oral BID  . insulin aspart  0-15 Units Subcutaneous TID WC  . insulin aspart  0-5 Units Subcutaneous QHS  . insulin aspart  15 Units Subcutaneous TID WC  . lubiprostone  24 mcg Oral  BID WC  . methylPREDNISolone (SOLU-MEDROL) injection  60 mg Intravenous Q12H  . polyethylene glycol  17 g Oral BID  . sorbitol  30 mL Oral Once  . sorbitol  30 mL Oral Daily  . sucralfate  1 g Oral TID WC & HS    Continuous Infusions: . sodium chloride 1,000 mL (04/22/13 1128)    Past Medical History  Diagnosis Date  . Bell's palsy 08/09/2010  . SLE 08/09/2010  . SJOGREN'S SYNDROME 08/09/2010  . Hypertension   . High cholesterol   . Anginal pain   . Obstructive sleep apnea on CPAP 2011  . Anxiety   . GERD (gastroesophageal reflux disease)   . CHF (congestive heart failure)   . Acute renal failure      Intractable nausea vomiting secondary to diabetic gastroparesis causing dehydration and acute renal failure /notes 04/01/2013  . Liver mass     biopsied 03/2013 at [facility], not malignant.  is to undergo radiologic ablation of the mass in  sept/October 2014.   Marland Kitchen Pneumonia 2000's    "once" (04/01/2013)  . On home oxygen therapy     "2L 24/7" (04/01/2013)  . Diabetic gastroparesis associated with type 2 diabetes mellitus     this is presumed diagnoses, not confirmed by any studies.   . Migraines     "maybe a couple times/yr" (04/01/2013)  . Stroke 2010; 10/2012    "left side is still weak from it, never fully regained full strength; no additions from stroke 10/2012"  . Obesity     Past Surgical History  Procedure Laterality Date  . Ectopic pregnancy surgery  1999  . Cardiac catheterization  02/07/12  . Hernia repair  1980's    umbilical  . Epidermoid cyst excision  2007     left breast  . Esophagogastroduodenoscopy N/A 02/26/2013    Procedure: ESOPHAGOGASTRODUODENOSCOPY (EGD);  Surgeon: Hilarie Fredrickson, MD;  Location: [facility] ENDOSCOPY;  Service: Endoscopy;  Laterality: N/A;  . Breast cyst excision Left 2007    epidermoid    Isabell Jarvis RD, LDN Pager [phone] After Hours pager [phone]

## 2013-04-22 NOTE — Progress Notes (Signed)
Inpatient Diabetes Program Recommendations  AACE/ADA: New Consensus Statement on Inpatient Glycemic Control (2013)  Target Ranges:  Prepandial:   less than 140 mg/dL      Peak postprandial:   less than 180 mg/dL (1-2 hours)      Critically ill patients:  140 - 180 mg/dL     Results for SOPHEA, RACKHAM (MRN [id number]) as of 04/22/2013 12:18  Ref. Range 04/22/2013 07:47 04/22/2013 11:29  Glucose-Capillary Latest Range: 70-99 mg/dL 409 (H) 811 (H)    **Per records, patient takes U-500 insulin at home (15 units tid with meals which is 0.03 ml).  Last seen by her endocrinologist. Dr. Romero Belling on 04/15/13.  **Noted patient currently getting IV steroids in hospital  **MD- Please consider adding basal insulin while patient in hospital on IV steroids- Lantus 20 units QHS   Will follow. Ambrose Finland RN, MSN, CDE Diabetes Coordinator Inpatient Diabetes Program Team Pager: [phone] (8a-10p)

## 2013-04-22 NOTE — Progress Notes (Signed)
Patient would like something solid to eat. She says that her nausea has calm down and that she is really hungry. RN contacted NP Craige Cotta- new bland order has been added.

## 2013-04-22 NOTE — Progress Notes (Signed)
[facility] PROGRESS NOTE  [name] WGN:[id number] DOB: [birth year] DOA: 04/21/2013 PCP: Ron Parker, MD Brief HPI: [name] is a 42 y.o. female  With a hx of lupus, chronic systolic chf, htn, diabetes, and a hx of prior CVA who presents to the ED with recurrent abdominal pain with nausea/vomiting. Reports last bowel movement was yesterday, but bowel movement prior to that was over one week ago. Pt reports not tolerating oral meds, including her lupus meds. Consequently, the patient has noted marked diffuse joint pains related to lupus flare. Given inability to tolerate PO, the hospitalist was consulted for possible admission.  Assessment/Plan:   1. Abdominal pain  1. Marked amounts of stool noted on 9/19 abd CT per my read 2. Patient reports no significant BM in over one week 3. Sorbitol ordered through the Ed, added miralax and milk of magnesia. 4. Consider a tiral of amitiza for chronic constipation 5. May consider alternative cathartics as needed 6. For now, clear liquid diet and advance as tolerated.   2. SLE  1. Suspected lupus flare 2. Resume home immunomodulaters and started on IV steroids.  3. DM  1. Cont home insulin with addition of SSI coverge. CBG (last 3)   Recent Labs  04/21/13 2115 04/22/13 0747 04/22/13 1129  GLUCAP 189* 221* 225*   4. Chronic systolic chf  1. Last documented EF 55-60% in 3/14 2. Compensated presently 5. HTN  1. BP stable 2. Resume home medications.  6. Hx Liver lesion  1. Notes from previous admission reviewed. Lesions determined to be benign. Followed by Dr. Braulio Bosch, [name] at [facility] 7. DVT prophylaxis  1. Heparin subQ Code Status: Full  Family Communication: none at bedside Disposition Plan: Pending     Consultants:  none  Procedures:  none  Antibiotics:  none  HPI/Subjective: Reports abdominal discomfort from constipation. Also reports generalized body aches   Objective: Filed  Vitals:   04/22/13 0500  BP: 147/80  Pulse: 70  Temp: 98.3 F (36.8 C)  Resp: 19    Intake/Output Summary (Last 24 hours) at 04/22/13 1152 Last data filed at 04/22/13 0900  Gross per 24 hour  Intake 1427.5 ml  Output      0 ml  Net 1427.5 ml   Filed Weights   04/21/13 2030 04/22/13 0500 04/22/13 0600  Weight: 168.103 kg (370 lb 9.6 oz) 168.466 kg (371 lb 6.4 oz) 168.511 kg (371 lb 8 oz)    Exam:   General:  Alert afebrile restless   Cardiovascular: s1s2  Respiratory: ctab  Abdomen: soft MILD tenderness no distention,    Data Reviewed: Basic Metabolic Panel:  Recent Labs Lab 04/18/13 2235 04/21/13 1319 04/22/13 0619  NA 138 141 137  K 3.3* 3.8 4.3  CL 98 104 102  CO2 31 29 25   GLUCOSE 148* 171* 252*  BUN 12 6 9   CREATININE 1.12* 0.89 0.85  CALCIUM 8.9 9.2 9.4   Liver Function Tests:  Recent Labs Lab 04/21/13 1631  AST 15  ALT 36*  ALKPHOS 101  BILITOT 0.6  PROT 7.3  ALBUMIN 3.2*    Recent Labs Lab 04/18/13 2235 04/21/13 1631  LIPASE 40 19   No results found for this basename: AMMONIA,  in the last 168 hours CBC:  Recent Labs Lab 04/18/13 2235 04/21/13 1319 04/22/13 0619  WBC 12.8* 9.0 8.9  HGB 8.9* 9.3* 9.2*  HCT 27.5* 28.2* 27.9*  MCV 83.6 83.4 83.0  PLT 260 248 254   Cardiac  Enzymes:  Recent Labs Lab 04/21/13 1613  TROPONINI <0.30   BNP (last 3 results)  Recent Labs  02/25/13 1918 04/01/13 1228 04/18/13 2235  PROBNP 162.5* 29.6 231.5*   CBG:  Recent Labs Lab 04/21/13 2115 04/22/13 0747 04/22/13 1129  GLUCAP 189* 221* 225*    Recent Results (from the past 240 hour(s))  CULTURE, BLOOD (ROUTINE X 2)     Status: None   Collection Time    04/19/13  3:20 AM      Result Value Range Status   Specimen Description BLOOD RIGHT HAND   Final   Special Requests BOTTLES DRAWN AEROBIC ONLY 5CC   Final   Culture  Setup Time     Final   Value: 04/19/2013 09:47     Performed at [facility]   Culture      Final   Value:        BLOOD CULTURE RECEIVED NO GROWTH TO DATE CULTURE WILL BE HELD FOR 5 DAYS BEFORE ISSUING A FINAL NEGATIVE REPORT     Performed at [facility]   Report Status PENDING   Incomplete  CULTURE, BLOOD (ROUTINE X 2)     Status: None   Collection Time    04/19/13  3:25 AM      Result Value Range Status   Specimen Description BLOOD RIGHT ARM   Final   Special Requests BOTTLES DRAWN AEROBIC ONLY 5CC   Final   Culture  Setup Time     Final   Value: 04/19/2013 09:47     Performed at [facility]   Culture     Final   Value:        BLOOD CULTURE RECEIVED NO GROWTH TO DATE CULTURE WILL BE HELD FOR 5 DAYS BEFORE ISSUING A FINAL NEGATIVE REPORT     Performed at [facility]   Report Status PENDING   Incomplete     Studies: Dg Chest 2 View  04/21/2013   *RADIOLOGY REPORT*  Clinical Data: Chest pain  CHEST - 2 VIEW  Comparison: April 19, 2013  Findings: There is no focal infiltrate, pulmonary edema, or pleural effusion.  The mediastinal contour is normal.  The heart size is probably mildly enlarged.  The soft tissues and osseous structures are stable.  IMPRESSION: No acute cardiopulmonary disease identified.   Original Report Authenticated By: Sherian Rein, M.D.    Scheduled Meds: . aspirin EC  81 mg Oral Daily  . clopidogrel  75 mg Oral Daily  . docusate sodium  100 mg Oral BID  . famotidine  20 mg Oral BID  . heparin  5,000 Units Subcutaneous Q8H  . insulin aspart  0-20 Units Subcutaneous TID WC  . insulin aspart  0-5 Units Subcutaneous QHS  . insulin aspart  15 Units Subcutaneous TID WC  . lubiprostone  24 mcg Oral BID WC  . methylPREDNISolone (SOLU-MEDROL) injection  60 mg Intravenous Q12H  . metoprolol  5 mg Intravenous Q6H  . polyethylene glycol  17 g Oral BID  . sorbitol  30 mL Oral Once  . sorbitol  30 mL Oral Daily  . sucralfate  1 g Oral TID WC & HS   Continuous Infusions: . sodium chloride 1,000 mL (04/22/13 1128)    Principal  Problem:   Constipation due to pain medication Active Problems:   IDDM   SLE   DM (diabetes mellitus)   HTN (hypertension)   Systolic CHF, acute on chronic   Abdominal pain, epigastric  Time spent: 25 minutes.     [facility]  [facility] Pager [phone] If 7PM-7AM, please contact night-coverage at [email or web address], password [facility] 04/22/2013, 11:52 AM  LOS: 1 day

## 2013-04-23 DIAGNOSIS — Z5189 Encounter for other specified aftercare: Secondary | ICD-10-CM

## 2013-04-23 DIAGNOSIS — E109 Type 1 diabetes mellitus without complications: Secondary | ICD-10-CM

## 2013-04-23 LAB — GLUCOSE, CAPILLARY
Glucose-Capillary: 261 mg/dL — ABNORMAL HIGH (ref 70–99)
Glucose-Capillary: 329 mg/dL — ABNORMAL HIGH (ref 70–99)
Glucose-Capillary: 372 mg/dL — ABNORMAL HIGH (ref 70–99)
Glucose-Capillary: 217 mg/dL — ABNORMAL HIGH (ref 70–99)

## 2013-04-23 LAB — URINE CULTURE: Colony Count: NO GROWTH

## 2013-04-23 MED ORDER — PROMETHAZINE HCL 25 MG/ML IJ SOLN
25.0000 mg | INTRAMUSCULAR | Status: DC | PRN
  Administered 2013-04-23 – 2013-04-27 (×12): 25 mg via INTRAVENOUS
  Filled 2013-04-23 (×12): qty 1

## 2013-04-23 MED ORDER — PREDNISONE 50 MG PO TABS
60.0000 mg | ORAL_TABLET | Freq: Every day | ORAL | Status: DC
  Administered 2013-04-24: 60 mg via ORAL
  Filled 2013-04-23 (×2): qty 1

## 2013-04-23 NOTE — Progress Notes (Signed)
[facility] PROGRESS NOTE  [name] ZOX:[id number] DOB: [birth year] DOA: 04/21/2013 PCP: Ron Parker, MD Brief HPI: [name] is a 42 y.o. female  With a hx of lupus, chronic systolic chf, htn, diabetes, and a hx of prior CVA who presents to the ED with recurrent abdominal pain with nausea/vomiting. Reports last bowel movement was yesterday, but bowel movement prior to that was over one week ago. Pt reports not tolerating oral meds, including her lupus meds. Consequently, the patient has noted marked diffuse joint pains related to lupus flare. Given inability to tolerate PO, the hospitalist was consulted for possible admission.  Assessment/Plan:   1. Abdominal pain  1. Marked amounts of stool noted on 9/19 abd CT per my read 2. Patient reports no significant BM in over one week 3. Sorbitol ordered through the Ed, added miralax and milk of magnesia. 4. Consider a tiral of amitiza for chronic constipation 5. May consider alternative cathartics as needed 6. For now, clear liquid diet and advance as tolerated.   2. SLE  1. Suspected lupus flare 2. Resume home immunomodulaters and started on IV steroids. Her symptoms improved and transitioned to oral steroids.  3. DM  1. Cont home insulin with addition of SSI coverge. CBG (last 3)   Recent Labs  04/22/13 2117 04/23/13 0753 04/23/13 1203  GLUCAP 227* 261* 329*   4. Chronic systolic chf  1. Last documented EF 55-60% in 3/14 2. Compensated presently 5. HTN  1. BP stable 2. Resume home medications.  6. Hx Liver lesion  1. Notes from previous admission reviewed. Lesions determined to be benign. Followed by Dr. Braulio Bosch, [name] at [facility] 7. DVT prophylaxis  1. Heparin subQ Code Status: Full  Family Communication: none at bedside Disposition Plan: Pending     Consultants:  none  Procedures:  none  Antibiotics:  none  HPI/Subjective: One BM last night.   generalized body aches  improved.   Objective: Filed Vitals:   04/23/13 1520  BP: 147/68  Pulse: 81  Temp: 98.3 F (36.8 C)  Resp: 18    Intake/Output Summary (Last 24 hours) at 04/23/13 1719 Last data filed at 04/23/13 1500  Gross per 24 hour  Intake    460 ml  Output      0 ml  Net    460 ml   Filed Weights   04/22/13 0600 04/23/13 0447 04/23/13 0520  Weight: 168.511 kg (371 lb 8 oz) 168.738 kg (372 lb) 171.7 kg (378 lb 8.5 oz)    Exam:   General:  Alert afebrile restless   Cardiovascular: s1s2  Respiratory: ctab  Abdomen: soft MILD tenderness no distention,    Data Reviewed: Basic Metabolic Panel:  Recent Labs Lab 04/18/13 2235 04/21/13 1319 04/22/13 0619  NA 138 141 137  K 3.3* 3.8 4.3  CL 98 104 102  CO2 31 29 25   GLUCOSE 148* 171* 252*  BUN 12 6 9   CREATININE 1.12* 0.89 0.85  CALCIUM 8.9 9.2 9.4   Liver Function Tests:  Recent Labs Lab 04/21/13 1631  AST 15  ALT 36*  ALKPHOS 101  BILITOT 0.6  PROT 7.3  ALBUMIN 3.2*    Recent Labs Lab 04/18/13 2235 04/21/13 1631  LIPASE 40 19   No results found for this basename: AMMONIA,  in the last 168 hours CBC:  Recent Labs Lab 04/18/13 2235 04/21/13 1319 04/22/13 0619  WBC 12.8* 9.0 8.9  HGB 8.9* 9.3* 9.2*  HCT 27.5* 28.2* 27.9*  MCV 83.6 83.4 83.0  PLT 260 248 254   Cardiac Enzymes:  Recent Labs Lab 04/21/13 1613  TROPONINI <0.30   BNP (last 3 results)  Recent Labs  02/25/13 1918 04/01/13 1228 04/18/13 2235  PROBNP 162.5* 29.6 231.5*   CBG:  Recent Labs Lab 04/22/13 1129 04/22/13 1623 04/22/13 2117 04/23/13 0753 04/23/13 1203  GLUCAP 225* 284* 227* 261* 329*    Recent Results (from the past 240 hour(s))  CULTURE, BLOOD (ROUTINE X 2)     Status: None   Collection Time    04/19/13  3:20 AM      Result Value Range Status   Specimen Description BLOOD RIGHT HAND   Final   Special Requests BOTTLES DRAWN AEROBIC ONLY 5CC   Final   Culture  Setup Time     Final   Value: 04/19/2013  09:47     Performed at [facility]   Culture     Final   Value:        BLOOD CULTURE RECEIVED NO GROWTH TO DATE CULTURE WILL BE HELD FOR 5 DAYS BEFORE ISSUING A FINAL NEGATIVE REPORT     Performed at [facility]   Report Status PENDING   Incomplete  CULTURE, BLOOD (ROUTINE X 2)     Status: None   Collection Time    04/19/13  3:25 AM      Result Value Range Status   Specimen Description BLOOD RIGHT ARM   Final   Special Requests BOTTLES DRAWN AEROBIC ONLY 5CC   Final   Culture  Setup Time     Final   Value: 04/19/2013 09:47     Performed at [facility]   Culture     Final   Value:        BLOOD CULTURE RECEIVED NO GROWTH TO DATE CULTURE WILL BE HELD FOR 5 DAYS BEFORE ISSUING A FINAL NEGATIVE REPORT     Performed at [facility]   Report Status PENDING   Incomplete  URINE CULTURE     Status: None   Collection Time    04/22/13  2:20 AM      Result Value Range Status   Specimen Description URINE, CLEAN CATCH   Final   Special Requests ADDED 0310   Final   Culture  Setup Time     Final   Value: 04/22/2013 03:20     Performed at [facility] Count     Final   Value: NO GROWTH     Performed at [facility]   Culture     Final   Value: NO GROWTH     Performed at [facility]   Report Status 04/23/2013 FINAL   Final     Studies: No results found.  Scheduled Meds: . aspirin EC  81 mg Oral Daily  . azaTHIOprine  100 mg Oral BID  . carvedilol  3.125 mg Oral BID WC  . citalopram  10 mg Oral QPM  . clopidogrel  75 mg Oral Daily  . docusate sodium  100 mg Oral BID  . famotidine  20 mg Oral BID  . feeding supplement  1 Container Oral BID BM  . heparin  5,000 Units Subcutaneous Q8H  . hydroxychloroquine  200 mg Oral BID  . insulin aspart  0-15 Units Subcutaneous TID WC  . insulin aspart  0-5 Units Subcutaneous QHS  . insulin aspart  15 Units Subcutaneous TID WC  . insulin  glargine  10 Units Subcutaneous BH-q7a  .  lubiprostone  24 mcg Oral BID WC  . polyethylene glycol  17 g Oral BID  . [START ON 04/24/2013] predniSONE  60 mg Oral Q breakfast  . sorbitol  30 mL Oral Once  . sorbitol  30 mL Oral Daily  . sucralfate  1 g Oral TID WC & HS   Continuous Infusions: . sodium chloride 1,000 mL (04/23/13 1333)    Principal Problem:   Constipation due to pain medication Active Problems:   IDDM   SLE   DM (diabetes mellitus)   HTN (hypertension)   Systolic CHF, acute on chronic   Abdominal pain, epigastric    Time spent: 25 minutes.     [facility]  [facility] Pager [phone] If 7PM-7AM, please contact night-coverage at [email or web address], password [email or web address] 04/23/2013, 5:19 PM  LOS: 2 days

## 2013-04-24 DIAGNOSIS — K2100 Gastro-esophageal reflux disease with esophagitis, without bleeding: Secondary | ICD-10-CM

## 2013-04-24 DIAGNOSIS — D649 Anemia, unspecified: Secondary | ICD-10-CM

## 2013-04-24 DIAGNOSIS — N179 Acute kidney failure, unspecified: Secondary | ICD-10-CM

## 2013-04-24 LAB — CBC
HCT: 27.6 % — ABNORMAL LOW (ref 36.0–46.0)
MCH: 27.6 pg (ref 26.0–34.0)
MCV: 86.5 fL (ref 78.0–100.0)
Platelets: 253 10*3/uL (ref 150–400)
RBC: 3.19 MIL/uL — ABNORMAL LOW (ref 3.87–5.11)
WBC: 10.5 10*3/uL (ref 4.0–10.5)

## 2013-04-24 LAB — GLUCOSE, CAPILLARY
Glucose-Capillary: 122 mg/dL — ABNORMAL HIGH (ref 70–99)
Glucose-Capillary: 240 mg/dL — ABNORMAL HIGH (ref 70–99)
Glucose-Capillary: 192 mg/dL — ABNORMAL HIGH (ref 70–99)

## 2013-04-24 MED ORDER — METOCLOPRAMIDE HCL 5 MG/ML IJ SOLN: 10.0000 mg | Freq: Three times a day (TID) | INTRAMUSCULAR | Status: DC

## 2013-04-24 MED ORDER — PREDNISONE 50 MG PO TABS
50.0000 mg | ORAL_TABLET | Freq: Every day | ORAL | Status: DC
  Administered 2013-04-25 – 2013-04-27 (×3): 50 mg via ORAL
  Filled 2013-04-24 (×4): qty 1

## 2013-04-24 NOTE — Progress Notes (Addendum)
[facility] PROGRESS NOTE  [name] ZOX:[id number] DOB: November 24, 1970 DOA: 04/21/2013 PCP: Ron Parker, MD Brief HPI: [name] is a 41 y.o. female  With a hx of lupus, chronic systolic chf, htn, diabetes, and a hx of prior CVA who presents to the ED with recurrent abdominal pain with nausea/vomiting. Reports last bowel movement was yesterday, but bowel movement prior to that was over one week ago. Pt reports not tolerating oral meds, including her lupus meds. Consequently, the patient has noted marked diffuse joint pains related to lupus flare. Given inability to tolerate PO, the hospitalist was consulted for possible admission.  Assessment/Plan:   1. Abdominal pain/N/V -multifactorial with recurrent hospitalizations, Constipation, ? Adrenal insufficiency and possibly gastroparesis -check Gastric emptying scan, -keep on clears today -Continue Prednisone-higher dose for now and gradually taper down -[facility] consulted, EGD 7/14 with mild esophagitis -Addendum: unable to have gastric emptying scan, HIDA due to weight limits, had akathisia due to Reglan recently   2. BRBPR -possibly hemorrhoidal, check Hb -stop ASA, keep on Plavix for now unless active further bleeding ensues -[facility] to eval  2. SLE  1. Suspected mild  lupus flare from missing med doses due to vomiting 2. Resumed home immunomodulatersand now on higher dose of Prednisone   3. DM  Continue lantus and meal coverage   Recent Labs  04/23/13 1637 04/23/13 2118 04/24/13 0804  GLUCAP 372* 217* 122*   4. Chronic systolic chf  1. Last documented EF 55-60% in 3/14 2. Compensated presently, cut down IVF  5. HTN  1. BP stable 2. Resumed home medications.   6. Indeterminate Liver lesion HCC vs Adenoma 1. Notes from previous admission reviewed. Followed by Dr. Braulio Bosch, [name] at [facility], completed Radiation for this   7. DVT prophylaxis  1. SCDs fur to BRBPR last pm  Code  Status: Full  Family Communication: none at bedside Disposition Plan: Pending     Consultants:  [facility] pending  Procedures:  none  Antibiotics:  none  HPI/Subjective: One BM last night with moderately  amt of BRBPR.      Objective: Filed Vitals:   04/24/13 0816  BP: 129/68  Pulse: 75  Temp:   Resp:     Intake/Output Summary (Last 24 hours) at 04/24/13 0943 Last data filed at 04/23/13 1700  Gross per 24 hour  Intake    700 ml  Output      0 ml  Net    700 ml   Filed Weights   04/23/13 0447 04/23/13 0520 04/24/13 0456  Weight: 168.738 kg (372 lb) 171.7 kg (378 lb 8.5 oz) 172 kg (379 lb 3.1 oz)    Exam:   General:  Alert afebrile restless , morbidly opbese  Cardiovascular: s1s2  Respiratory: ctab  Abdomen: soft , NT, ND, BS present  Ext: no edema c/c, ,    Data Reviewed: Basic Metabolic Panel:  Recent Labs Lab 04/18/13 2235 04/21/13 1319 04/22/13 0619  NA 138 141 137  K 3.3* 3.8 4.3  CL 98 104 102  CO2 31 29 25   GLUCOSE 148* 171* 252*  BUN 12 6 9   CREATININE 1.12* 0.89 0.85  CALCIUM 8.9 9.2 9.4   Liver Function Tests:  Recent Labs Lab 04/21/13 1631  AST 15  ALT 36*  ALKPHOS 101  BILITOT 0.6  PROT 7.3  ALBUMIN 3.2*    Recent Labs Lab 04/18/13 2235 04/21/13 1631  LIPASE 40 19   No results found for this  basename: AMMONIA,  in the last 168 hours CBC:  Recent Labs Lab 04/18/13 2235 04/21/13 1319 04/22/13 0619  WBC 12.8* 9.0 8.9  HGB 8.9* 9.3* 9.2*  HCT 27.5* 28.2* 27.9*  MCV 83.6 83.4 83.0  PLT 260 248 254   Cardiac Enzymes:  Recent Labs Lab 04/21/13 1613  TROPONINI <0.30   BNP (last 3 results)  Recent Labs  02/25/13 1918 04/01/13 1228 04/18/13 2235  PROBNP 162.5* 29.6 231.5*   CBG:  Recent Labs Lab 04/23/13 0753 04/23/13 1203 04/23/13 1637 04/23/13 2118 04/24/13 0804  GLUCAP 261* 329* 372* 217* 122*    Recent Results (from the past 240 hour(s))  CULTURE, BLOOD (ROUTINE X 2)     Status:  None   Collection Time    04/19/13  3:20 AM      Result Value Range Status   Specimen Description BLOOD RIGHT HAND   Final   Special Requests BOTTLES DRAWN AEROBIC ONLY 5CC   Final   Culture  Setup Time     Final   Value: 04/19/2013 09:47     Performed at [facility]   Culture     Final   Value:        BLOOD CULTURE RECEIVED NO GROWTH TO DATE CULTURE WILL BE HELD FOR 5 DAYS BEFORE ISSUING A FINAL NEGATIVE REPORT     Performed at [facility]   Report Status PENDING   Incomplete  CULTURE, BLOOD (ROUTINE X 2)     Status: None   Collection Time    04/19/13  3:25 AM      Result Value Range Status   Specimen Description BLOOD RIGHT ARM   Final   Special Requests BOTTLES DRAWN AEROBIC ONLY 5CC   Final   Culture  Setup Time     Final   Value: 04/19/2013 09:47     Performed at [facility]   Culture     Final   Value:        BLOOD CULTURE RECEIVED NO GROWTH TO DATE CULTURE WILL BE HELD FOR 5 DAYS BEFORE ISSUING A FINAL NEGATIVE REPORT     Performed at [facility]   Report Status PENDING   Incomplete  URINE CULTURE     Status: None   Collection Time    04/22/13  2:20 AM      Result Value Range Status   Specimen Description URINE, CLEAN CATCH   Final   Special Requests ADDED 0310   Final   Culture  Setup Time     Final   Value: 04/22/2013 03:20     Performed at [facility] Count     Final   Value: NO GROWTH     Performed at [facility]   Culture     Final   Value: NO GROWTH     Performed at [facility]   Report Status 04/23/2013 FINAL   Final     Studies: No results found.  Scheduled Meds: . azaTHIOprine  100 mg Oral BID  . carvedilol  3.125 mg Oral BID WC  . citalopram  10 mg Oral QPM  . clopidogrel  75 mg Oral Daily  . docusate sodium  100 mg Oral BID  . famotidine  20 mg Oral BID  . feeding supplement  1 Container Oral BID BM  . hydroxychloroquine  200 mg Oral BID  . insulin aspart  0-15 Units  Subcutaneous TID WC  . insulin  aspart  0-5 Units Subcutaneous QHS  . insulin aspart  15 Units Subcutaneous TID WC  . insulin glargine  10 Units Subcutaneous BH-q7a  . lubiprostone  24 mcg Oral BID WC  . polyethylene glycol  17 g Oral BID  . [START ON 04/25/2013] predniSONE  50 mg Oral Q breakfast  . sorbitol  30 mL Oral Once  . sorbitol  30 mL Oral Daily  . sucralfate  1 g Oral TID WC & HS   Continuous Infusions: . sodium chloride 75 mL/hr at 04/24/13 0029    Principal Problem:   Constipation due to pain medication Active Problems:   IDDM   SLE   DM (diabetes mellitus)   HTN (hypertension)   Systolic CHF, acute on chronic   Abdominal pain, epigastric    Time spent: 25 minutes.     Zannie Cove  [facility] Pager [phone] If 7PM-7AM, please contact night-coverage at [email or web address], password [facility] 04/24/2013, 9:43 AM  LOS: 3 days

## 2013-04-24 NOTE — Progress Notes (Signed)
CBGs on 9/23:  [phone] mg/dl      4/01:  027-253 mg/dl Recommend that Lantus be increased to 15-20 units daily and continue Novolog RESISTANT scale AC &HS and Novolog15 units with each meal if CBGs continue greater than 180 mg/dl and while on steroids. Will continue to follow while in hospital.  Smith Mince RN BSN CDE

## 2013-04-24 NOTE — Progress Notes (Deleted)
[facility] Consult: 11:43 AM 04/24/2013  Referring Provider:  Dr Jomarie Longs Primary Care Physician:  Ron Parker, MD Primary Gastroenterologist:  Gentry Fitz, Dr. Marina Goodell, initial eval during 01/2013 admission.    Reason for Consultation:  Chronic, recurrent N/V  HPI:.  [name] is a 42 y.o. female. Obese pt (367#) with type 2 DM, sjogren's syndrome/polymyositis( on Plaqenil and Imuran). Chronic plavix.  On CT 01/2013 had 5.9 x 7.7 cm right hepatic mass. It was 7.1 x 7.6 x 6.4 CM by MRI. AFP of 5. Liver biopsy of liver mass 03/25/18 at [facility] with benign pathology. S/p radio ablation at [facility] on 9/17.  She reports nausea during procedure.  She was discharged home on 9/18.  Abdominal pain no better or worse since ablation.  Has follow up in mid October at [facility]. Had EGD for n/v/abdominal pain by Dr Marina Goodell on 02/26/13 showing esophagitis andno retained gastric contents. She has been labelled as having diabetic gastroparesis, but no emptying studies ever done.  Her transaminases in July were 193 and 266. Since then she has had intermittent elevation of ALT which is 36 as of 9/21.  She is too obese for HIDA scan.   Seen again as inpt consult 04/01/13 by Dr Leone Payor for same n/v.  He felt she may have adrenal insufficiency and treated her with 3 doses of hydrocortisone, this corresponded with improvement in the n/v. Reglan d/c'd secondary to akathisic leg movement.  Discharged on Prednisone taper over 6 days with final dose of 5 mg daily her normal dose, carafate, Bid Protonix, prn zofran, hydrocodone, plavix, ASA , zofran etc.  She says the n/v and upper abdominal pain resumed within 24 hours of discharge.   Readmitted 9/21 with recurrent abdominal pain, n/v.  Some constipation.  Had gone one week without BM until she had BM on 9/20 Not keeping down Lupus meds so joint pain flaring.  zofran IV is helping.  At home vomiting to point of not  keeping Zofran down.   He insulin was d/c'd in late July 2014. Sugars have been labile . Her A1C of 6.4 on sep 1   Her weight has gone from 177 kg to 166 kg back to 172 kg (379#)  in the last 2 months.     Past Medical History  Diagnosis Date  . Bell's palsy 08/09/2010  . SLE 08/09/2010  . SJOGREN'S SYNDROME 08/09/2010  . Hypertension   . High cholesterol   . Anginal pain   . Obstructive sleep apnea on CPAP 2011  . Anxiety   . GERD (gastroesophageal reflux disease)   . CHF (congestive heart failure)   . Acute renal failure      Intractable nausea vomiting secondary to diabetic gastroparesis causing dehydration and acute renal failure /notes 04/01/2013  . Liver mass     biopsied 03/2013 at [facility], not malignant.  is to undergo radiologic ablation of the mass in sept/October 2014.   Marland Kitchen Pneumonia 2000's    "once" (04/01/2013)  . On home oxygen therapy     "2L 24/7" (04/01/2013)  . Diabetic gastroparesis associated with type 2 diabetes mellitus     this is presumed diagnoses, not confirmed by any studies.   . Migraines     "maybe a couple times/yr" (04/01/2013)  . Stroke 2010; 10/2012    "left side is still weak from it, never fully regained full strength; no additions from stroke 10/2012"  . Obesity     Past Surgical History  Procedure Laterality Date  .  Ectopic pregnancy surgery  1999  . Cardiac catheterization  02/07/12  . Hernia repair  1980's    umbilical  . Epidermoid cyst excision  2007     left breast  . Esophagogastroduodenoscopy N/A 02/26/2013    Procedure: ESOPHAGOGASTRODUODENOSCOPY (EGD);  Surgeon: Hilarie Fredrickson, MD;  Location: [facility] ENDOSCOPY;  Service: Endoscopy;  Laterality: N/A;  . Breast cyst excision Left 2007    epidermoid    Prior to Admission medications   Medication Sig Start Date End Date Taking? Authorizing Provider  albuterol (PROAIR HFA) 108 (90 BASE) MCG/ACT inhaler Inhale 2 puffs into the lungs every 6 (six) hours as needed. For shortness of breath/wheeze    Yes Historical Provider, MD  alendronate (FOSAMAX) 70 MG tablet Take 70 mg by mouth every 7 (seven) days. thursdays. Take with a full glass of water on an empty stomach.   Yes Historical Provider, MD  ALPRAZolam Prudy Feeler) 0.5 MG tablet Take 0.5 mg by mouth 2 (two) times daily as needed for sleep or anxiety.    Yes Historical Provider, MD  aspirin EC 81 MG tablet Take 81 mg by mouth daily.   Yes Historical Provider, MD  azaTHIOprine (IMURAN) 50 MG tablet Take 50 mg by mouth 2 (two) times daily.    Yes Historical Provider, MD  carvedilol (COREG) 3.125 MG tablet Take 3.125 mg by mouth 2 (two) times daily with a meal.   Yes Historical Provider, MD  cetirizine (ZYRTEC) 10 MG tablet Take 10 mg by mouth daily.   Yes Historical Provider, MD  citalopram (CELEXA) 10 MG tablet Take 1 tablet (10 mg total) by mouth every evening. 04/06/13  Yes Belkys A Regalado, MD  clopidogrel (PLAVIX) 75 MG tablet Take 75 mg by mouth daily.   Yes Historical Provider, MD  cycloSPORINE (RESTASIS) 0.05 % ophthalmic emulsion Place 1 drop into both eyes 3 (three) times daily.   Yes Historical Provider, MD  enoxaparin (LOVENOX) 40 MG/0.4ML injection Inject 1.6 mLs (160 mg total) into the skin daily. 04/19/13  Yes Candyce Churn, MD  famotidine (PEPCID) 20 MG tablet Take 20 mg by mouth 2 (two) times daily.   Yes Historical Provider, MD  gabapentin (NEURONTIN) 300 MG capsule Take 300 mg by mouth 2 (two) times daily.    Yes Historical Provider, MD  hydroxychloroquine (PLAQUENIL) 200 MG tablet Take 200 mg by mouth 2 (two) times daily.    Yes Historical Provider, MD  insulin regular human CONCENTRATED (HUMULIN R) 500 UNIT/ML SOLN injection Inject 0.03 mLs (15 Units total) into the skin 3 (three) times daily with meals. Please draw up as though it is just 3 units at a time. 04/15/13  Yes Romero Belling, MD  isosorbide-hydrALAZINE (BIDIL) 20-37.5 MG per tablet Take 1 tablet by mouth 3 (three) times daily.   Yes Historical Provider, MD  Multiple  Vitamin (MULTIVITAMIN WITH MINERALS) TABS tablet Take 1 tablet by mouth daily.   Yes Historical Provider, MD  ondansetron (ZOFRAN) 4 MG tablet Take 1 tablet (4 mg total) by mouth every 6 (six) hours as needed for nausea. 04/06/13  Yes Belkys A Regalado, MD  oxyCODONE-acetaminophen (PERCOCET/ROXICET) 5-325 MG per tablet Take 1 tablet by mouth every 4 (four) hours as needed for pain.   Yes Historical Provider, MD  polyethylene glycol (MIRALAX / GLYCOLAX) packet Take 17 g by mouth 2 (two) times daily. 03/01/13  Yes Tora Kindred York, PA-C  Potassium Chloride Crys CR (KLOR-CON M20 PO) Take 20 mEq by mouth 2 (two) times daily.  Yes Historical Provider, MD  predniSONE (DELTASONE) 1 MG tablet Take 4 mg by mouth daily.   Yes Historical Provider, MD  senna (SENOKOT) 8.6 MG TABS tablet Take 2 tablets by mouth daily as needed (constipation).  03/01/13  Yes Tora Kindred York, PA-C  torsemide (DEMADEX) 20 MG tablet Take 1 tablet (20 mg total) by mouth 2 (two) times daily. 04/06/13  Yes Belkys A Regalado, MD  traZODone (DESYREL) 50 MG tablet Take 1 tablet (50 mg total) by mouth at bedtime. 04/06/13  Yes Belkys A Regalado, MD  sucralfate (CARAFATE) 1 GM/10ML suspension Take 10 mLs (1 g total) by mouth 4 (four) times daily -  with meals and at bedtime. 04/06/13   Belkys A Regalado, MD    Scheduled Meds: . azaTHIOprine  100 mg Oral BID  . carvedilol  3.125 mg Oral BID WC  . citalopram  10 mg Oral QPM  . clopidogrel  75 mg Oral Daily  . docusate sodium  100 mg Oral BID  . famotidine  20 mg Oral BID  . feeding supplement  1 Container Oral BID BM  . hydroxychloroquine  200 mg Oral BID  . insulin aspart  0-15 Units Subcutaneous TID WC  . insulin aspart  0-5 Units Subcutaneous QHS  . insulin aspart  15 Units Subcutaneous TID WC  . insulin glargine  10 Units Subcutaneous [id number]  . lubiprostone  24 mcg Oral BID WC  . polyethylene glycol  17 g Oral BID  . [START ON 04/25/2013] predniSONE  50 mg Oral Q breakfast  . sorbitol  30  mL Oral Once  . sorbitol  30 mL Oral Daily  . sucralfate  1 g Oral TID WC & HS   Infusions: . sodium chloride 20 mL/hr at 04/24/13 0959   PRN Meds: albuterol, ALPRAZolam, hydrALAZINE, HYDROmorphone (DILAUDID) injection, magnesium hydroxide, ondansetron (ZOFRAN) IV, promethazine   Allergies as of 04/21/2013 - Review Complete 04/21/2013  Allergen Reaction Noted  . Oxycodone-acetaminophen Hives 08/09/2010    Family History  Problem Relation Age of Onset  . Diabetes Mother   . Hypertension Other   . Stroke Other   . Arthritis Other   Stomach cancer in mat aunt and her brother.  Colon cancer in mat aunt.    History   Social History  . Marital Status: Single    Spouse Name: N/A    Number of Children: N/A  . Years of Education: N/A   Occupational History  .      student   Social History Main Topics  . Smoking status: Former Smoker -- 3 years    Types: Cigarettes    Quit date: 06/01/1990  . Smokeless tobacco: Never Used  . Alcohol Use: No  . Drug Use: No  . Sexual Activity: Not Currently   Other Topics Concern  . Not on file   Social History Narrative   Regular exercise-yes    REVIEW OF SYSTEMS: Unable to exercise. Uses rolling walker since stroke in 2011. Chronic, stable, baseline DOE.  No dysuria, no bloody nose, no bleeding problems. No NSAIDs. Uses prn Tylenol for frequent headaches.  No rash, itching or skin sores  No oligura.  Dry mouth.  All other ROS negative or as per HPI   PHYSICAL EXAM: Vital signs in last 24 hours: Temp:  [98.3 F (36.8 C)-98.5 F (36.9 C)] 98.4 F (36.9 C) (09/24 0529) Pulse Rate:  [71-83] 75 (09/24 0816) Resp:  [18] 18 (09/24 0529) BP: (129-147)/(68-82) 129/68 mmHg (09/24 0816)  SpO2:  [98 %-100 %] 100 % (09/24 0529) Weight:  [172 kg (379 lb 3.1 oz)] 172 kg (379 lb 3.1 oz) (09/24 0456)  General: obese AAF who looks miserable but comfortable.  Head: Normocephalic, atraumatic  Eyes: No icterus or pallor  Ears: Not HOH   Nose: No discharge  Mouth: Clear, moist oral mucosa.  Neck: No mass or JVD.  Lungs: Clear but diminished bilaterally. Some chest wall tenderness  Heart: RRR. No MRG  Abdomen: Soft, obese, no tenderness.  BS hypoactive.  Rectal: deferred  Musc/Skeltl: no joint contracture.  Extremities: No pedal edema  Neurologic: Pleasant, oriented x 3. No tremor Skin: No sores, no rash  Tattoos: none  Nodes: No cervical adenopathy.  Psych: Flat affect, not agitated.   Intake/Output from previous day: 09/23 0701 - 09/24 0700 In: 700 [P.O.:700] Out: -  Intake/Output this shift:    LAB RESULTS:  Recent Labs  04/21/13 1319 04/22/13 0619  WBC 9.0 8.9  HGB 9.3* 9.2*  HCT 28.2* 27.9*  PLT 248 254  MCV    86  BMET Lab Results  Component Value Date   NA 137 04/22/2013   NA 141 04/21/2013   NA 138 04/18/2013   K 4.3 04/22/2013   K 3.8 04/21/2013   K 3.3* 04/18/2013   CL 102 04/22/2013   CL 104 04/21/2013   CL 98 04/18/2013   CO2 25 04/22/2013   CO2 29 04/21/2013   CO2 31 04/18/2013   GLUCOSE 252* 04/22/2013   GLUCOSE 171* 04/21/2013   GLUCOSE 148* 04/18/2013   BUN 9 04/22/2013   BUN 6 04/21/2013   BUN 12 04/18/2013   CREATININE 0.85 04/22/2013   CREATININE 0.89 04/21/2013   CREATININE 1.12* 04/18/2013   CALCIUM 9.4 04/22/2013   CALCIUM 9.2 04/21/2013   CALCIUM 8.9 04/18/2013   LFT  Recent Labs  04/21/13 1631  PROT 7.3  ALBUMIN 3.2*  AST 15  ALT 36*  ALKPHOS 101  BILITOT 0.6  BILIDIR 0.1  IBILI 0.5   PT/INR Lab Results  Component Value Date   INR 1.11 04/01/2013   INR 1.10 02/28/2013   INR 1.05 02/26/2013     RADIOLOGY STUDIES: CT Abdomen and Pelvis  03/26/13.  Findings: Heart size upper normal to mildly enlarged. Mild linear  lung base opacities, favor atelectasis.  Hepatic steatosis. Large segment 4/5 mass. Increased high  attenuation along the posterior margin of the mass and apparent  intralesional fluid fluid level may reflect intralesional  hemorrhage. Heterogeneous  enhancement of the inferior right hepatic  lobe is nonspecific.  Unremarkable spleen, pancreas, adrenal glands, kidneys. No  hydroureteronephrosis.  Decompressed colon limits evaluation. No overt colitis. No bowel  obstruction. Normal appendix. Rounded high density along the  umbilicus is similar, measuring 3.2 cm. Trace free fluid within  the pelvis. No free intraperitoneal air. Small hiatal hernia. No  lymphadenopathy.  Normal caliber aorta and branch vessels without aneurysmal  dilatation.  Decompressed bladder. Unremarkable CT appearance to the uterus.  No adnexal mass.  No acute osseous finding.  IMPRESSION:  Post biopsy with high attenuation within the mass, may reflect  intralesional hemorrhage. No definite intraperitoneal bleed.  Wedge-shaped areas of hypoattenuation along the inferior margin of  the right hepatic lobe are nonspecific. May reflect variant  perfusion or infarctions  Pathology 03/25/18 Liver biopsy/FNA  Pathology of liver biopsy, FNA  Liver mass, Cytology core biopsy: Liver parenchyma and portions of loose fibro-connectivetissue. Comment: The core biopsy shows fragments of hepatic parenchyma with  sheets of cytologically bland hepatocytes. Separate tissue portions are composed of loose fibroconnective tissue with mild inflammatory component (including lymphocytes and rare eosinophils). One bile duct is appreciated near a large blood vessel but the sheets of hepatocytes appear to lack bile ductules. Given the clinical history of a 6-7 cm mass the two entities which need to be considered in the differential diagnosis include hepatocellular adenoma (favored) and focal nodular hyperplasia. Hepatocellular carcinoma is much less likely. Correlation with clinical and imaging findings is recommended.  Abdominal ultrasound 02/20/13 Findings: Gallbladder is visualized in multiple projections.  There are no gallstones, gallbladder wall thickening, or   pericholecystic fluid collection. There is no intrahepatic, common  hepatic, or common bile duct dilatation. Pancreas appears normal.  In the left lobe of the liver, there is a mass-like area which is  slightly hyperechoic compared the remainder of the liver measuring  7.6 x 5.2 x 7.8 cm in size. No other focal liver lesions are  identified.  Spleen is normal in size and homogeneous in echotexture. Kidneys  bilaterally appear within normal limits. There is no ascites.  Aorta is nonaneurysmal. Inferior vena cava appears patent.  Conclusion: Mass-like area in the left lobe of the liver. This  finding warrants further evaluation. MRI with and without  intravenous contrast would be the imaging study of choice to  further assess.  Study otherwise unremarkable.   ENDOSCOPIC STUDIES: 02/26/13 EGD, diagnostic  ENDOSCOPIC IMPRESSION:  1. Reflux esophagitis  RECOMMENDATIONS:  1. PPI bid  2. Gastric Emptying Scan .  3. HIDA scan   IMPRESSION: * Chronic n/v in diabetic. Reflux esophagitis on 02/26/13 EGD. Taking PPI and carafate at home. Did not tolerate reglan.  Unable to pursue any nuclear studies due to morbid obesity. Pt had markedly elevated Transaminases in July, ALT has continued to be intermittently elevated.  We need to consider biliary source for her ongoing upper abd pain and N/V *  Benign liver mass. Radioablation completed at [facility] on Wednesday, one week ago.   *   Normocytic anemia.  Iron studies of 9/6 with low TIBC and normal ferritin, Iron, Iron saturation. *  Sjogren's, polymyositis.  *  Chronic Plavix *  DM.  No insulin use at home.    PLAN: *  HIDA would be helpful but her obesity will not allow for this nor for gastric emptying study.  I see the later is ordered by hospitalist however.  *  Would ask general surgery to assess possible occult GB disease as explanation for the sxs.    LOS: 3 days   Jennye Moccasin  04/24/2013, 11:43 AM Pager: [phone]      Attending  physician's note   I have taken a history, examined the patient and reviewed the chart. I agree with the Advanced Practitioner's note, impression and recommendations. Gastroparesis and biliary disease are the top considerations. R/O adrenal insufficiency. Optimize blood sugar control. PPI BID. GES and HIDA are probably not feasible due to obesity. Primary service to consider further evaluation for adrenal insufficiency. Gen Surgery consult regarding possible GB disorder.  Meryl Dare, MD Clementeen Graham

## 2013-04-25 DIAGNOSIS — E119 Type 2 diabetes mellitus without complications: Secondary | ICD-10-CM

## 2013-04-25 DIAGNOSIS — K3184 Gastroparesis: Secondary | ICD-10-CM

## 2013-04-25 LAB — CBC
Hemoglobin: 8.8 g/dL — ABNORMAL LOW (ref 12.0–15.0)
MCH: 27.1 pg (ref 26.0–34.0)
MCHC: 31.2 g/dL (ref 30.0–36.0)
RDW: 16.9 % — ABNORMAL HIGH (ref 11.5–15.5)

## 2013-04-25 LAB — CULTURE, BLOOD (ROUTINE X 2): Culture: NO GROWTH

## 2013-04-25 LAB — GLUCOSE, CAPILLARY
Glucose-Capillary: 104 mg/dL — ABNORMAL HIGH (ref 70–99)
Glucose-Capillary: 243 mg/dL — ABNORMAL HIGH (ref 70–99)
Glucose-Capillary: 122 mg/dL — ABNORMAL HIGH (ref 70–99)

## 2013-04-25 MED ORDER — INSULIN GLARGINE 100 UNIT/ML ~~LOC~~ SOLN
15.0000 [IU] | SUBCUTANEOUS | Status: DC
  Administered 2013-04-26 – 2013-04-27 (×2): 15 [IU] via SUBCUTANEOUS
  Filled 2013-04-25 (×2): qty 0.15

## 2013-04-25 MED ORDER — FUROSEMIDE 40 MG PO TABS
40.0000 mg | ORAL_TABLET | Freq: Every day | ORAL | Status: DC
  Administered 2013-04-25 – 2013-04-27 (×3): 40 mg via ORAL
  Filled 2013-04-25 (×3): qty 1

## 2013-04-25 MED ORDER — SODIUM CHLORIDE 0.9 % IV SOLN
500.0000 mg | Freq: Three times a day (TID) | INTRAVENOUS | Status: DC
  Administered 2013-04-25 – 2013-04-27 (×6): 500 mg via INTRAVENOUS
  Filled 2013-04-25 (×8): qty 500

## 2013-04-25 NOTE — Progress Notes (Signed)
Discussed with PA  Christeena Krogh M. Delani Kohli, MD, FACS General, Bariatric, & Minimally Invasive Surgery [facility]  

## 2013-04-25 NOTE — Progress Notes (Signed)
Patient reports "feeling shaky" this morning; her CBG is 104 and poor PO intake this am with the full liquids tray at breakfast.  Insulin not given.

## 2013-04-25 NOTE — Progress Notes (Signed)
We have been asked to evaluate this patient for abdominal pain, nausea, and vomiting.  We have actually evaluated the patient about 6 weeks ago for similar complaints and felt this may be related to her hepatic mass as opposed to her gallbladder.  She also was felt to be a very poor surgical candidate and not someone that we would operate on.  She was transferred to [facility] where she has been seen and treated by Dr. Deveron Furlong.  She just underwent radioablation a week ago for this.  The patient continues to have nausea, vomiting, and abdominal pain.  We have been asked to see her again for her gallbladder.  Her gallbladder has no gallstones.  The last u/s showed no evidence of cholecystitis.  Given, the patient's co-morbid conditions and an inability to prove gallbladder source, there are no indications for surgical intervention here.  She should be re-evaluate and further treatment plan developed with current surgeon, Dr. Flonnie Hailstone at [facility].  Thank you for contacting us.  Josselyn Harkins E 7:57 AM 04/25/2013

## 2013-04-25 NOTE — Consult Note (Signed)
[facility] Consult: 9:14 AM 04/25/2013  Referring Provider:  Dr Jomarie Longs Primary Care Physician:  Ron Parker, MD Primary Gastroenterologist:  Gentry Fitz, Dr. Marina Goodell, initial eval during 01/2013 admission.    Reason for Consultation:  Chronic, recurrent N/V  HPI:.  [name] is a 42 y.o. female. Obese pt (367#) with type 2 DM, sjogren's syndrome/polymyositis( on Plaqenil and Imuran). Chronic plavix.  On CT 01/2013 had 5.9 x 7.7 cm right hepatic mass. It was 7.1 x 7.6 x 6.4 CM by MRI. AFP of 5. Liver biopsy of liver mass 03/25/18 at [facility] with benign pathology. S/p radio ablation at [facility] on 9/17.  She reports nausea during procedure.  She was discharged home on 9/18.  Abdominal pain no better or worse since ablation.  Has follow up in mid October at [facility]. Had EGD for n/v/abdominal pain by Dr Marina Goodell on 02/26/13 showing esophagitis andno retained gastric contents. She has been labelled as having diabetic gastroparesis, but no emptying studies ever done.  Her transaminases in July were 193 and 266. Since then she has had intermittent elevation of ALT which is 36 as of 9/21.  She is too obese for HIDA scan.   Seen again as inpt consult 04/01/13 by Dr Leone Payor for same n/v.  He felt she may have adrenal insufficiency and treated her with 3 doses of hydrocortisone, this corresponded with improvement in the n/v. Reglan d/c'd secondary to akathisic leg movement.  Discharged on Prednisone taper over 6 days with final dose of 5 mg daily her normal dose, carafate, Bid Protonix, prn zofran, hydrocodone, plavix, ASA , zofran etc.  She says the n/v and upper abdominal pain resumed within 24 hours of discharge.   Readmitted 9/21 with recurrent abdominal pain, n/v.  Some constipation.  Had gone one week without BM until she had BM on 9/20 Not keeping down Lupus meds so joint pain flaring.  zofran IV is helping.  At home vomiting to point of not  keeping Zofran down.   He insulin was d/c'd in late July 2014. Sugars have been labile . Her A1C of 6.4 on sep 1   Her weight has gone from 177 kg to 166 kg back to 172 kg (379#)  in the last 2 months.     Past Medical History  Diagnosis Date  . Bell's palsy 08/09/2010  . SLE 08/09/2010  . SJOGREN'S SYNDROME 08/09/2010  . Hypertension   . High cholesterol   . Anginal pain   . Obstructive sleep apnea on CPAP 2011  . Anxiety   . GERD (gastroesophageal reflux disease)   . CHF (congestive heart failure)   . Acute renal failure      Intractable nausea vomiting secondary to diabetic gastroparesis causing dehydration and acute renal failure /notes 04/01/2013  . Liver mass     biopsied 03/2013 at [facility], not malignant.  is to undergo radiologic ablation of the mass in sept/October 2014.   Marland Kitchen Pneumonia 2000's    "once" (04/01/2013)  . On home oxygen therapy     "2L 24/7" (04/01/2013)  . Diabetic gastroparesis associated with type 2 diabetes mellitus     this is presumed diagnoses, not confirmed by any studies.   . Migraines     "maybe a couple times/yr" (04/01/2013)  . Stroke 2010; 10/2012    "left side is still weak from it, never fully regained full strength; no additions from stroke 10/2012"  . Obesity     Past Surgical History  Procedure Laterality Date  .  Ectopic pregnancy surgery  1999  . Cardiac catheterization  02/07/12  . Hernia repair  1980's    umbilical  . Epidermoid cyst excision  2007     left breast  . Esophagogastroduodenoscopy N/A 02/26/2013    Procedure: ESOPHAGOGASTRODUODENOSCOPY (EGD);  Surgeon: Hilarie Fredrickson, MD;  Location: [facility] ENDOSCOPY;  Service: Endoscopy;  Laterality: N/A;  . Breast cyst excision Left 2007    epidermoid    Prior to Admission medications   Medication Sig Start Date End Date Taking? Authorizing Provider  albuterol (PROAIR HFA) 108 (90 BASE) MCG/ACT inhaler Inhale 2 puffs into the lungs every 6 (six) hours as needed. For shortness of breath/wheeze    Yes Historical Provider, MD  alendronate (FOSAMAX) 70 MG tablet Take 70 mg by mouth every 7 (seven) days. thursdays. Take with a full glass of water on an empty stomach.   Yes Historical Provider, MD  ALPRAZolam Prudy Feeler) 0.5 MG tablet Take 0.5 mg by mouth 2 (two) times daily as needed for sleep or anxiety.    Yes Historical Provider, MD  aspirin EC 81 MG tablet Take 81 mg by mouth daily.   Yes Historical Provider, MD  azaTHIOprine (IMURAN) 50 MG tablet Take 50 mg by mouth 2 (two) times daily.    Yes Historical Provider, MD  carvedilol (COREG) 3.125 MG tablet Take 3.125 mg by mouth 2 (two) times daily with a meal.   Yes Historical Provider, MD  cetirizine (ZYRTEC) 10 MG tablet Take 10 mg by mouth daily.   Yes Historical Provider, MD  citalopram (CELEXA) 10 MG tablet Take 1 tablet (10 mg total) by mouth every evening. 04/06/13  Yes Belkys A Regalado, MD  clopidogrel (PLAVIX) 75 MG tablet Take 75 mg by mouth daily.   Yes Historical Provider, MD  cycloSPORINE (RESTASIS) 0.05 % ophthalmic emulsion Place 1 drop into both eyes 3 (three) times daily.   Yes Historical Provider, MD  enoxaparin (LOVENOX) 40 MG/0.4ML injection Inject 1.6 mLs (160 mg total) into the skin daily. 04/19/13  Yes Candyce Churn, MD  famotidine (PEPCID) 20 MG tablet Take 20 mg by mouth 2 (two) times daily.   Yes Historical Provider, MD  gabapentin (NEURONTIN) 300 MG capsule Take 300 mg by mouth 2 (two) times daily.    Yes Historical Provider, MD  hydroxychloroquine (PLAQUENIL) 200 MG tablet Take 200 mg by mouth 2 (two) times daily.    Yes Historical Provider, MD  insulin regular human CONCENTRATED (HUMULIN R) 500 UNIT/ML SOLN injection Inject 0.03 mLs (15 Units total) into the skin 3 (three) times daily with meals. Please draw up as though it is just 3 units at a time. 04/15/13  Yes Romero Belling, MD  isosorbide-hydrALAZINE (BIDIL) 20-37.5 MG per tablet Take 1 tablet by mouth 3 (three) times daily.   Yes Historical Provider, MD  Multiple  Vitamin (MULTIVITAMIN WITH MINERALS) TABS tablet Take 1 tablet by mouth daily.   Yes Historical Provider, MD  ondansetron (ZOFRAN) 4 MG tablet Take 1 tablet (4 mg total) by mouth every 6 (six) hours as needed for nausea. 04/06/13  Yes Belkys A Regalado, MD  oxyCODONE-acetaminophen (PERCOCET/ROXICET) 5-325 MG per tablet Take 1 tablet by mouth every 4 (four) hours as needed for pain.   Yes Historical Provider, MD  polyethylene glycol (MIRALAX / GLYCOLAX) packet Take 17 g by mouth 2 (two) times daily. 03/01/13  Yes Tora Kindred York, PA-C  Potassium Chloride Crys CR (KLOR-CON M20 PO) Take 20 mEq by mouth 2 (two) times daily.  Yes Historical Provider, MD  predniSONE (DELTASONE) 1 MG tablet Take 4 mg by mouth daily.   Yes Historical Provider, MD  senna (SENOKOT) 8.6 MG TABS tablet Take 2 tablets by mouth daily as needed (constipation).  03/01/13  Yes Tora Kindred York, PA-C  torsemide (DEMADEX) 20 MG tablet Take 1 tablet (20 mg total) by mouth 2 (two) times daily. 04/06/13  Yes Belkys A Regalado, MD  traZODone (DESYREL) 50 MG tablet Take 1 tablet (50 mg total) by mouth at bedtime. 04/06/13  Yes Belkys A Regalado, MD  sucralfate (CARAFATE) 1 GM/10ML suspension Take 10 mLs (1 g total) by mouth 4 (four) times daily -  with meals and at bedtime. 04/06/13   Belkys A Regalado, MD    Scheduled Meds: . azaTHIOprine  100 mg Oral BID  . carvedilol  3.125 mg Oral BID WC  . citalopram  10 mg Oral QPM  . clopidogrel  75 mg Oral Daily  . docusate sodium  100 mg Oral BID  . famotidine  20 mg Oral BID  . feeding supplement  1 Container Oral BID BM  . hydroxychloroquine  200 mg Oral BID  . insulin aspart  0-15 Units Subcutaneous TID WC  . insulin aspart  0-5 Units Subcutaneous QHS  . insulin aspart  15 Units Subcutaneous TID WC  . [START ON 04/26/2013] insulin glargine  15 Units Subcutaneous BH-q7a  . lubiprostone  24 mcg Oral BID WC  . polyethylene glycol  17 g Oral BID  . predniSONE  50 mg Oral Q breakfast  . sorbitol  30  mL Oral Once  . sorbitol  30 mL Oral Daily  . sucralfate  1 g Oral TID WC & HS   Infusions: . sodium chloride 20 mL/hr at 04/25/13 0800   PRN Meds: albuterol, ALPRAZolam, hydrALAZINE, HYDROmorphone (DILAUDID) injection, magnesium hydroxide, ondansetron (ZOFRAN) IV, promethazine   Allergies as of 04/21/2013 - Review Complete 04/21/2013  Allergen Reaction Noted  . Oxycodone-acetaminophen Hives 08/09/2010    Family History  Problem Relation Age of Onset  . Diabetes Mother   . Hypertension Other   . Stroke Other   . Arthritis Other   Stomach cancer in mat aunt and her brother.  Colon cancer in mat aunt.    History   Social History  . Marital Status: Single    Spouse Name: N/A    Number of Children: N/A  . Years of Education: N/A   Occupational History  .      student   Social History Main Topics  . Smoking status: Former Smoker -- 3 years    Types: Cigarettes    Quit date: 06/01/1990  . Smokeless tobacco: Never Used  . Alcohol Use: No  . Drug Use: No  . Sexual Activity: Not Currently   Other Topics Concern  . Not on file   Social History Narrative   Regular exercise-yes    REVIEW OF SYSTEMS: Unable to exercise. Uses rolling walker since stroke in 2011. Chronic, stable, baseline DOE.  No dysuria, no bloody nose, no bleeding problems. No NSAIDs. Uses prn Tylenol for frequent headaches.  No rash, itching or skin sores  No oligura.  Dry mouth.  All other ROS negative or as per HPI   PHYSICAL EXAM: Vital signs in last 24 hours: Temp:  [98 F (36.7 C)-98.2 F (36.8 C)] 98.2 F (36.8 C) (09/25 0508) Pulse Rate:  [69-83] 80 (09/25 0830) Resp:  [18-20] 18 (09/25 0830) BP: (136-148)/(65-83) 147/80 mmHg (09/25 0830)  SpO2:  [98 %-99 %] 98 % (09/25 0830) Weight:  [173.4 kg (382 lb 4.4 oz)] 173.4 kg (382 lb 4.4 oz) (09/25 0508)  General: obese AAF who looks miserable but comfortable.  Head: Normocephalic, atraumatic  Eyes: No icterus or pallor  Ears: Not  HOH  Nose: No discharge  Mouth: Clear, moist oral mucosa.  Neck: No mass or JVD.  Lungs: Clear but diminished bilaterally. Some chest wall tenderness  Heart: RRR. No MRG  Abdomen: Soft, obese, no tenderness.  BS hypoactive.  Rectal: deferred  Musc/Skeltl: no joint contracture.  Extremities: No pedal edema  Neurologic: Pleasant, oriented x 3. No tremor Skin: No sores, no rash  Tattoos: none  Nodes: No cervical adenopathy.  Psych: Flat affect, not agitated.   Intake/Output from previous day: 09/24 0701 - 09/25 0700 In: 390 [I.V.:390] Out: 403 [Urine:403] Intake/Output this shift: Total I/O In: 260 [P.O.:240; I.V.:20] Out: -   LAB RESULTS:  Recent Labs  04/24/13 1144 04/25/13 0849  WBC 10.5 10.9*  HGB 8.8* 8.8*  HCT 27.6* 28.2*  PLT 253 247  MCV    86  BMET Lab Results  Component Value Date   NA 137 04/22/2013   NA 141 04/21/2013   NA 138 04/18/2013   K 4.3 04/22/2013   K 3.8 04/21/2013   K 3.3* 04/18/2013   CL 102 04/22/2013   CL 104 04/21/2013   CL 98 04/18/2013   CO2 25 04/22/2013   CO2 29 04/21/2013   CO2 31 04/18/2013   GLUCOSE 252* 04/22/2013   GLUCOSE 171* 04/21/2013   GLUCOSE 148* 04/18/2013   BUN 9 04/22/2013   BUN 6 04/21/2013   BUN 12 04/18/2013   CREATININE 0.85 04/22/2013   CREATININE 0.89 04/21/2013   CREATININE 1.12* 04/18/2013   CALCIUM 9.4 04/22/2013   CALCIUM 9.2 04/21/2013   CALCIUM 8.9 04/18/2013   LFT No results found for this basename: PROT, ALBUMIN, AST, ALT, ALKPHOS, BILITOT, BILIDIR, IBILI,  in the last 72 hours PT/INR Lab Results  Component Value Date   INR 1.11 04/01/2013   INR 1.10 02/28/2013   INR 1.05 02/26/2013     RADIOLOGY STUDIES: CT Abdomen and Pelvis  03/26/13.  Findings: Heart size upper normal to mildly enlarged. Mild linear  lung base opacities, favor atelectasis.  Hepatic steatosis. Large segment 4/5 mass. Increased high  attenuation along the posterior margin of the mass and apparent  intralesional fluid fluid level may  reflect intralesional  hemorrhage. Heterogeneous enhancement of the inferior right hepatic  lobe is nonspecific.  Unremarkable spleen, pancreas, adrenal glands, kidneys. No  hydroureteronephrosis.  Decompressed colon limits evaluation. No overt colitis. No bowel  obstruction. Normal appendix. Rounded high density along the  umbilicus is similar, measuring 3.2 cm. Trace free fluid within  the pelvis. No free intraperitoneal air. Small hiatal hernia. No  lymphadenopathy.  Normal caliber aorta and branch vessels without aneurysmal  dilatation.  Decompressed bladder. Unremarkable CT appearance to the uterus.  No adnexal mass.  No acute osseous finding.  IMPRESSION:  Post biopsy with high attenuation within the mass, may reflect  intralesional hemorrhage. No definite intraperitoneal bleed.  Wedge-shaped areas of hypoattenuation along the inferior margin of  the right hepatic lobe are nonspecific. May reflect variant  perfusion or infarctions  Pathology 03/25/18 Liver biopsy/FNA  Pathology of liver biopsy, FNA  Liver mass, Cytology core biopsy: Liver parenchyma and portions of loose fibro-connectivetissue. Comment: The core biopsy shows fragments of hepatic parenchyma with sheets of cytologically bland hepatocytes.  Separate tissue portions are composed of loose fibroconnective tissue with mild inflammatory component (including lymphocytes and rare eosinophils). One bile duct is appreciated near a large blood vessel but the sheets of hepatocytes appear to lack bile ductules. Given the clinical history of a 6-7 cm mass the two entities which need to be considered in the differential diagnosis include hepatocellular adenoma (favored) and focal nodular hyperplasia. Hepatocellular carcinoma is much less likely. Correlation with clinical and imaging findings is recommended.  Abdominal ultrasound 02/20/13 Findings: Gallbladder is visualized in multiple projections.  There are no  gallstones, gallbladder wall thickening, or  pericholecystic fluid collection. There is no intrahepatic, common  hepatic, or common bile duct dilatation. Pancreas appears normal.  In the left lobe of the liver, there is a mass-like area which is  slightly hyperechoic compared the remainder of the liver measuring  7.6 x 5.2 x 7.8 cm in size. No other focal liver lesions are  identified.  Spleen is normal in size and homogeneous in echotexture. Kidneys  bilaterally appear within normal limits. There is no ascites.  Aorta is nonaneurysmal. Inferior vena cava appears patent.  Conclusion: Mass-like area in the left lobe of the liver. This  finding warrants further evaluation. MRI with and without  intravenous contrast would be the imaging study of choice to  further assess.  Study otherwise unremarkable.   ENDOSCOPIC STUDIES: 02/26/13 EGD, diagnostic  ENDOSCOPIC IMPRESSION:  1. Reflux esophagitis  RECOMMENDATIONS:  1. PPI bid  2. Gastric Emptying Scan .  3. HIDA scan   IMPRESSION: * Chronic n/v in diabetic. Reflux esophagitis on 02/26/13 EGD. Taking PPI and carafate at home. Did not tolerate reglan.  Unable to pursue any nuclear studies due to morbid obesity. Pt had markedly elevated Transaminases in July, ALT has continued to be intermittently elevated.  We need to consider biliary source for her ongoing upper abd pain and N/V *  Benign liver mass. Radioablation completed at [facility] on Wednesday, one week ago.   *   Normocytic anemia.  Iron studies of 9/6 with low TIBC and normal ferritin, Iron, Iron saturation. *  Sjogren's, polymyositis.  *  Chronic Plavix *  DM.  No insulin use at home.    PLAN: *  HIDA would be helpful but her obesity will not allow for this nor for gastric emptying study.  I see the later is ordered by hospitalist however.  *  Would ask general surgery to assess possible occult GB disease as explanation for the sxs.    LOS: 4 days   Jennye Moccasin  04/25/2013,  9:14 AM Pager: [phone]      Attending physician's note   I have taken a history, examined the patient and reviewed the chart. I agree with the Advanced Practitioner's note, impression and recommendations. Gastroparesis and biliary disease are the top considerations. R/O adrenal insufficiency. Optimize blood sugar control. PPI BID. GES and HIDA are probably not feasible due to obesity. Primary service to consider further evaluation for adrenal insufficiency. Gen Surgery consult regarding possible GB disorder.  Meryl Dare, MD [facility]

## 2013-04-25 NOTE — Progress Notes (Signed)
    [facility] Rounding Note  04/25/2013, 9:12 AM  SUBJECTIVE:       Surgery sees no indication for cholecystectomy, suggest follow up with Dr Deveron Furlong at [facility], which is set for mid October.  Tolerating fulls. Dr Jomarie Longs planning to trial erythromycin.  Pt developed tremors from Reglan in past.  Last BM was 2 days ago: watery with some blood Pt notes that the smell of food makes her nauseated  OBJECTIVE:         Vital signs in last 24 hours:    Temp:  [98 F (36.7 C)-98.2 F (36.8 C)] 98.2 F (36.8 C) (09/25 0508) Pulse Rate:  [69-83] 80 (09/25 0830) Resp:  [18-20] 18 (09/25 0830) BP: (136-148)/(65-83) 147/80 mmHg (09/25 0830) SpO2:  [98 %-99 %] 98 % (09/25 0830) Weight:  [173.4 kg (382 lb 4.4 oz)] 173.4 kg (382 lb 4.4 oz) (09/25 0508) Last BM Date: 04/23/13 General: looks tired, not acutely ill.  obese   Heart: RRR Chest: clear. Some labored breathing with speach Abdomen: soft, tender epigastrum (mild).  Active BS  Extremities: no CCE Neuro/Psych:  Pleasant, no confusion.  Seems depressed  Intake/Output from previous day: 09/24 0701 - 09/25 0700 In: 390 [I.V.:390] Out: 403 [Urine:403]  Intake/Output this shift: Total I/O In: 260 [P.O.:240; I.V.:20] Out: -   Lab Results:  Recent Labs  04/24/13 1144  WBC 10.5  HGB 8.8*  HCT 27.6*  PLT 253    ASSESMENT: * Chronic n/v in diabetic. Reflux esophagitis on 02/26/13 EGD. Taking PPI and carafate at home. Did not tolerate reglan. Unable to pursue any nuclear studies due to morbid obesity. Pt had markedly elevated Transaminases in July, ALT has continued to be intermittently elevated. Despite gen surgery notes, still think we need to consider biliary source for her ongoing upper abd pain and N/V.  Probable diabetic gastroparesis. Trial Erythromycin seems like a good idea.  Smell of food making her nauseated  * Benign liver mass. Radioablation completed at [facility] on Wednesday, one week ago. Has October follow up there. *   Small volume blood with diarhea PTA.  No plans for endoscopic workup unless this persists.   * Normocytic anemia. Iron studies of 9/6 with low TIBC and normal ferritin, Iron, Iron saturation.  * Sjogren's, polymyositis.  * Chronic Plavix  * DM. No insulin use at home.     PLAN: *  Start erythromycin. Gastroparesis restrictions-low residue, low fat *  Agree with attempt to arrange GI follow up with Dr Alycia Rossetti, motility specialist, at [facility]. MDs in [place] have nothing to offer her.  Also that facility may have nuc med equipment that can accommodate her weight.    LOS: 4 days   Jennye Moccasin  04/25/2013, 9:12 AM Pager: [phone]     Attending physician's note   I have taken an interval history, reviewed the chart and examined the patient. I agree with the Advanced Practitioner's note, impression and recommendations. Agree with round the clock antiemetics and trial of erythromycin. GI evaluation at [facility] along with surgical follow up at [facility] is a very good plan. Would mobilize her, have her ambulate, sitting in a chair, etc.  Johnesha Acheampong T. Russella Dar, MD [facility]

## 2013-04-25 NOTE — Progress Notes (Signed)
[facility] PROGRESS NOTE  [name] NWG:[id number] DOB: 03-02-1971 DOA: 04/21/2013 PCP: Ron Parker, MD Brief HPI: [name] is a 42 y.o. female  With a hx of lupus, chronic systolic chf, htn, diabetes, and a hx of prior CVA who presents to the ED with recurrent abdominal pain with nausea/vomiting. Reports last bowel movement was yesterday, but bowel movement prior to that was over one week ago. Pt reports not tolerating oral meds, including her lupus meds. Consequently, the patient has noted marked diffuse joint pains related to lupus flare. Given inability to tolerate PO, the hospitalist was consulted for possible admission.  Assessment/Plan:  1. Abdominal pain/N/V -multifactorial with recurrent hospitalizations, Suspect Gastroparesis, other possibilities are Constipation,  Adrenal insufficiency and possibly GB disorder -unable to check Gastric emptying scan, HIDA scan due to weight limitations -full liquid diet today -Trial of Erythromycin, 500mg  IV pre-meals, QTC <480 -unable to tolerate Reglan due to Akathesia -Continue Prednisone-higher dose for now and gradually taper down -[place] [facility] consult appreciated, EGD 7/14 with mild esophagitis -options limited -surgical note appreciated -will make her an appt with dr[facility]Koch at [facility]  2. BRBPR -no further episodes -possibly hemorrhoidal, Hb stable - ASA on hold, keep on Plavix for now unless active further bleeding ensues -[facility] consult appreciated  2. SLE  -Suspected mild  lupus flare from missing med doses due to vomiting -Resumed home immunomodulatersand now on higher dose of Prednisone, slwoly wean down to basal dose of 5mg    3. DM  Continue lantus, changed to 15units and meal coverage   Recent Labs  04/24/13 1704 04/24/13 2139 04/25/13 0751  GLUCAP 240* 192* 104*   4. Chronic systolic chf  -Last documented EF 55-60% in 3/14 -Compensated presently, off IVF -add low dose PO lasix  5. HTN  -BP  stable -Resumed home medications.   6. Indeterminate Liver lesion HCC vs Adenoma -Notes from previous admission reviewed. Followed by Dr. Braulio Bosch, [name] at [facility], completed Radiation for this last week, due for MRI liver next month at [facility]   7. DVT prophylaxis  1. SCDs fur to BRBPR last pm  Code Status: Full  Family Communication: none at bedside Disposition Plan: Pending     Consultants:   GI pending  Procedures:  none  Antibiotics:  none  HPI/Subjective: Tolerated clears yesterday, felt nauseated multiple times but no vomiting, no BM yetserday  Objective: Filed Vitals:   04/25/13 0830  BP: 147/80  Pulse: 80  Temp:   Resp: 18    Intake/Output Summary (Last 24 hours) at 04/25/13 0923 Last data filed at 04/25/13 0830  Gross per 24 hour  Intake    650 ml  Output    403 ml  Net    247 ml   Filed Weights   04/23/13 0520 04/24/13 0456 04/25/13 0508  Weight: 171.7 kg (378 lb 8.5 oz) 172 kg (379 lb 3.1 oz) 173.4 kg (382 lb 4.4 oz)    Exam:   General:  Alert afebrile restless , morbidly obese  Cardiovascular: s1s2/RRR  Respiratory: ctab  Abdomen: soft , mild diffuse tenderness, ND, BS present  Ext: no edema c/c, ,    Data Reviewed: Basic Metabolic Panel:  Recent Labs Lab 04/18/13 2235 04/21/13 1319 04/22/13 0619  NA 138 141 137  K 3.3* 3.8 4.3  CL 98 104 102  CO2 31 29 25   GLUCOSE 148* 171* 252*  BUN 12 6 9   CREATININE 1.12* 0.89 0.85  CALCIUM 8.9 9.2 9.4  Liver Function Tests:  Recent Labs Lab 04/21/13 1631  AST 15  ALT 36*  ALKPHOS 101  BILITOT 0.6  PROT 7.3  ALBUMIN 3.2*    Recent Labs Lab 04/18/13 2235 04/21/13 1631  LIPASE 40 19   No results found for this basename: AMMONIA,  in the last 168 hours CBC:  Recent Labs Lab 04/18/13 2235 04/21/13 1319 04/22/13 0619 04/24/13 1144 04/25/13 0849  WBC 12.8* 9.0 8.9 10.5 10.9*  HGB 8.9* 9.3* 9.2* 8.8* 8.8*  HCT 27.5* 28.2* 27.9* 27.6*  28.2*  MCV 83.6 83.4 83.0 86.5 86.8  PLT 260 248 254 253 247   Cardiac Enzymes:  Recent Labs Lab 04/21/13 1613  TROPONINI <0.30   BNP (last 3 results)  Recent Labs  02/25/13 1918 04/01/13 1228 04/18/13 2235  PROBNP 162.5* 29.6 231.5*   CBG:  Recent Labs Lab 04/24/13 0804 04/24/13 1151 04/24/13 1704 04/24/13 2139 04/25/13 0751  GLUCAP 122* 202* 240* 192* 104*    Recent Results (from the past 240 hour(s))  CULTURE, BLOOD (ROUTINE X 2)     Status: None   Collection Time    04/19/13  3:20 AM      Result Value Range Status   Specimen Description BLOOD RIGHT HAND   Final   Special Requests BOTTLES DRAWN AEROBIC ONLY 5CC   Final   Culture  Setup Time     Final   Value: 04/19/2013 09:47     Performed at [facility]   Culture     Final   Value: NO GROWTH 5 DAYS     Performed at [facility]   Report Status 04/25/2013 FINAL   Final  CULTURE, BLOOD (ROUTINE X 2)     Status: None   Collection Time    04/19/13  3:25 AM      Result Value Range Status   Specimen Description BLOOD RIGHT ARM   Final   Special Requests BOTTLES DRAWN AEROBIC ONLY 5CC   Final   Culture  Setup Time     Final   Value: 04/19/2013 09:47     Performed at [facility]   Culture     Final   Value: NO GROWTH 5 DAYS     Performed at [facility]   Report Status 04/25/2013 FINAL   Final  URINE CULTURE     Status: None   Collection Time    04/22/13  2:20 AM      Result Value Range Status   Specimen Description URINE, CLEAN CATCH   Final   Special Requests ADDED 0310   Final   Culture  Setup Time     Final   Value: 04/22/2013 03:20     Performed at [facility] Count     Final   Value: NO GROWTH     Performed at [facility]   Culture     Final   Value: NO GROWTH     Performed at [facility]   Report Status 04/23/2013 FINAL   Final     Studies: No results found.  Scheduled Meds: . azaTHIOprine  100 mg Oral BID  .  carvedilol  3.125 mg Oral BID WC  . citalopram  10 mg Oral QPM  . clopidogrel  75 mg Oral Daily  . docusate sodium  100 mg Oral BID  . erythromycin  500 mg Intravenous TID AC  . famotidine  20 mg Oral BID  . feeding supplement  1 Container Oral BID BM  . furosemide  40 mg Oral Daily  . hydroxychloroquine  200 mg Oral BID  . insulin aspart  0-15 Units Subcutaneous TID WC  . insulin aspart  0-5 Units Subcutaneous QHS  . insulin aspart  15 Units Subcutaneous TID WC  . [START ON 04/26/2013] insulin glargine  15 Units Subcutaneous BH-q7a  . lubiprostone  24 mcg Oral BID WC  . polyethylene glycol  17 g Oral BID  . predniSONE  50 mg Oral Q breakfast  . sorbitol  30 mL Oral Once  . sorbitol  30 mL Oral Daily  . sucralfate  1 g Oral TID WC & HS   Continuous Infusions: . sodium chloride 20 mL/hr at 04/25/13 0800    Principal Problem:   Constipation due to pain medication Active Problems:   IDDM   SLE   DM (diabetes mellitus)   HTN (hypertension)   Systolic CHF, acute on chronic   Abdominal pain, epigastric    Time spent: 25 minutes.     Zannie Cove  [facility] Pager [phone] If 7PM-7AM, please contact night-coverage at [email or web address], password [facility] 04/25/2013, 9:23 AM  LOS: 4 days

## 2013-04-26 DIAGNOSIS — R1013 Epigastric pain: Secondary | ICD-10-CM

## 2013-04-26 DIAGNOSIS — K5900 Constipation, unspecified: Secondary | ICD-10-CM

## 2013-04-26 DIAGNOSIS — I10 Essential (primary) hypertension: Secondary | ICD-10-CM

## 2013-04-26 LAB — GLUCOSE, CAPILLARY

## 2013-04-26 MED ORDER — BISACODYL 10 MG RE SUPP: 10.0000 mg | Freq: Every day | RECTAL | Status: DC | PRN

## 2013-04-26 MED ORDER — ENOXAPARIN SODIUM 40 MG/0.4ML ~~LOC~~ SOLN: 40.0000 mg | SUBCUTANEOUS | Status: DC

## 2013-04-26 MED ORDER — PANTOPRAZOLE SODIUM 40 MG PO TBEC
40.0000 mg | DELAYED_RELEASE_TABLET | Freq: Two times a day (BID) | ORAL | Status: DC
  Administered 2013-04-26 – 2013-04-27 (×4): 40 mg via ORAL
  Filled 2013-04-26 (×4): qty 1

## 2013-04-26 MED ORDER — PANTOPRAZOLE SODIUM 40 MG PO TBEC: 40.0000 mg | DELAYED_RELEASE_TABLET | Freq: Every day | ORAL | Status: DC

## 2013-04-26 MED ORDER — ENOXAPARIN SODIUM 100 MG/ML ~~LOC~~ SOLN
85.0000 mg | SUBCUTANEOUS | Status: DC
  Administered 2013-04-26 – 2013-04-27 (×2): 85 mg via SUBCUTANEOUS
  Filled 2013-04-26 (×3): qty 1

## 2013-04-26 NOTE — Progress Notes (Signed)
[facility] PROGRESS NOTE  [name]:[id number] DOB: 01/04/1971 DOA: 04/21/2013 PCP: Ron Parker, MD Brief HPI: [name] is a 42 y.o. female  With a hx of lupus, chronic systolic chf, htn, diabetes, and a hx of prior CVA who presents to the ED with recurrent abdominal pain with nausea/vomiting. Reports last bowel movement was yesterday, but bowel movement prior to that was over one week ago. Pt reports not tolerating oral meds, including her lupus meds. Consequently, the patient has noted marked diffuse joint pains related to lupus flare. Given inability to tolerate PO, the hospitalist was consulted for possible admission.  Assessment/Plan:  1. Abdominal pain/N/V -multifactorial with recurrent hospitalizations, Suspect Gastroparesis, other possibilities are Constipation,  Adrenal insufficiency and possibly GB disorder -unable to check Gastric emptying scan, HIDA scan due to weight limitations -on soft bland diet -continue Erythromycin, 500mg  IV pre-meals, QTC <480 -unable to tolerate Reglan due to Akathesia -Continue Prednisone-higher dose for now and gradually taper down -[place] [facility] consult, med titration appreciated  EGD 7/14 with mild esophagitis -options limited -surgical note appreciated -I called [facility] and asked to Fax notes to [phone] for appts Attn [place] for Fu with Dr.Koch  2. BRBPR -no further episodes -possibly hemorrhoidal, Hb stable - ASA on hold, keep on Plavix for now unless active further bleeding ensues -[facility] consult appreciated  2. SLE  -Suspected mild  lupus flare from missing med doses due to vomiting -Resumed home immunomodulatersand now on higher dose of Prednisone, slwoly wean down to basal dose of 5mg    3. DM  Continue lantus, changed to 15units and meal coverage   Recent Labs  04/25/13 1657 04/25/13 2135 04/26/13 0759  GLUCAP 243* 122* 99   4. Chronic systolic chf  -Last documented EF 55-60% in 3/14 -Compensated  presently, off IVF -continue low dose PO lasix  5. HTN  -BP stable -Resumed home medications.   6. Indeterminate Liver lesion HCC vs Adenoma -Notes from previous admission reviewed. Followed by Dr. Braulio Bosch, [name] at [facility], completed Radiation for this last week, due for MRI liver next month at [facility]   7. DVT prophylaxis : resume lovenox  8. OSA -ordered CPAP       Code Status: Full  Family Communication: none at bedside Disposition Plan: Pending     Consultants:  [facility] [facility] pending  Procedures:  none  Antibiotics:  none  HPI/Subjective: Ready to eat regular food, didn't each much  yesterday since it was mainly tomato soup which makes her sick. Breathing ok, joint aches and pains better  Objective: Filed Vitals:   04/26/13 0812  BP: 149/87  Pulse: 87  Temp:   Resp:     Intake/Output Summary (Last 24 hours) at 04/26/13 1153 Last data filed at 04/26/13 0859  Gross per 24 hour  Intake    600 ml  Output      1 ml  Net    599 ml   Filed Weights   04/24/13 0456 04/25/13 0508 04/26/13 0406  Weight: 172 kg (379 lb 3.1 oz) 173.4 kg (382 lb 4.4 oz) 172.1 kg (379 lb 6.6 oz)    Exam:   General:  Alert afebrile restless , morbidly obese  Cardiovascular: s1s2/RRR  Respiratory: ctab  Abdomen: soft , mild diffuse tenderness, ND, BS present  Ext: no edema c/c, ,    Data Reviewed: Basic Metabolic Panel:  Recent Labs Lab 04/21/13 1319 04/22/13 0619  NA 141 137  K 3.8 4.3  CL 104  102  CO2 29 25  GLUCOSE 171* 252*  BUN 6 9  CREATININE 0.89 0.85  CALCIUM 9.2 9.4   Liver Function Tests:  Recent Labs Lab 04/21/13 1631  AST 15  ALT 36*  ALKPHOS 101  BILITOT 0.6  PROT 7.3  ALBUMIN 3.2*    Recent Labs Lab 04/21/13 1631  LIPASE 19   No results found for this basename: AMMONIA,  in the last 168 hours CBC:  Recent Labs Lab 04/21/13 1319 04/22/13 0619 04/24/13 1144 04/25/13 0849  WBC 9.0 8.9 10.5 10.9*   HGB 9.3* 9.2* 8.8* 8.8*  HCT 28.2* 27.9* 27.6* 28.2*  MCV 83.4 83.0 86.5 86.8  PLT 248 254 253 247   Cardiac Enzymes:  Recent Labs Lab 04/21/13 1613  TROPONINI <0.30   BNP (last 3 results)  Recent Labs  02/25/13 1918 04/01/13 1228 04/18/13 2235  PROBNP 162.5* 29.6 231.5*   CBG:  Recent Labs Lab 04/25/13 0751 04/25/13 1204 04/25/13 1657 04/25/13 2135 04/26/13 0759  GLUCAP 104* 213* 243* 122* 99    Recent Results (from the past 240 hour(s))  CULTURE, BLOOD (ROUTINE X 2)     Status: None   Collection Time    04/19/13  3:20 AM      Result Value Range Status   Specimen Description BLOOD RIGHT HAND   Final   Special Requests BOTTLES DRAWN AEROBIC ONLY 5CC   Final   Culture  Setup Time     Final   Value: 04/19/2013 09:47     Performed at [facility]   Culture     Final   Value: NO GROWTH 5 DAYS     Performed at [facility]   Report Status 04/25/2013 FINAL   Final  CULTURE, BLOOD (ROUTINE X 2)     Status: None   Collection Time    04/19/13  3:25 AM      Result Value Range Status   Specimen Description BLOOD RIGHT ARM   Final   Special Requests BOTTLES DRAWN AEROBIC ONLY 5CC   Final   Culture  Setup Time     Final   Value: 04/19/2013 09:47     Performed at [facility]   Culture     Final   Value: NO GROWTH 5 DAYS     Performed at [facility]   Report Status 04/25/2013 FINAL   Final  URINE CULTURE     Status: None   Collection Time    04/22/13  2:20 AM      Result Value Range Status   Specimen Description URINE, CLEAN CATCH   Final   Special Requests ADDED 0310   Final   Culture  Setup Time     Final   Value: 04/22/2013 03:20     Performed at [facility] Count     Final   Value: NO GROWTH     Performed at [facility]   Culture     Final   Value: NO GROWTH     Performed at [facility]   Report Status 04/23/2013 FINAL   Final     Studies: No results found.  Scheduled Meds: .  azaTHIOprine  100 mg Oral BID  . carvedilol  3.125 mg Oral BID WC  . citalopram  10 mg Oral QPM  . clopidogrel  75 mg Oral Daily  . docusate sodium  100 mg Oral BID  . erythromycin  500 mg Intravenous TID  AC  . feeding supplement  1 Container Oral BID BM  . furosemide  40 mg Oral Daily  . hydroxychloroquine  200 mg Oral BID  . insulin aspart  0-15 Units Subcutaneous TID WC  . insulin aspart  0-5 Units Subcutaneous QHS  . insulin aspart  15 Units Subcutaneous TID WC  . insulin glargine  15 Units Subcutaneous BH-q7a  . lubiprostone  24 mcg Oral BID WC  . pantoprazole  40 mg Oral BID  . polyethylene glycol  17 g Oral BID  . predniSONE  50 mg Oral Q breakfast   Continuous Infusions: . sodium chloride 20 mL/hr at 04/25/13 0800    Principal Problem:   Constipation due to pain medication Active Problems:   IDDM   SLE   DM (diabetes mellitus)   HTN (hypertension)   Systolic CHF, acute on chronic   Abdominal pain, epigastric    Time spent: 25 minutes.     Zannie Cove  [facility] Pager [phone] If 7PM-7AM, please contact night-coverage at [email or web address], password [facility] 04/26/2013, 11:53 AM  LOS: 5 days

## 2013-04-26 NOTE — Progress Notes (Signed)
RT placed patient on cpap, via nasal mask with 2lpm 02 bleed in with auto setting. RT will continue to monitor.

## 2013-04-26 NOTE — Evaluation (Signed)
Physical Therapy Evaluation Patient Details Name: [name] MRN: [id number] DOB: April 11, 1971 Today's Date: 04/26/2013 Time: 4098-1191 PT Time Calculation (min): 18 min  PT Assessment / Plan / Recommendation History of Present Illness  Pt is a 42 y/o female admitted for nausea/vomiting, constipation and dehydration. Pt is also experiencing a lupus flare-up as she was unable to keep her medications down at home.  Clinical Impression  Pt is limited at this time by 10/10 pain from Lupus flare-up. Pt reports that it feels as if her bones are going to break. Nursing present at beginning of session and provided pain medication via IV. Pt did well with functional mobility and required only min guard. Ambulation was limited due to pt's pain level, however nursing reports that she has been ambulating independently to/from the bathroom. This patient is appropriate for skilled PT interventions to address functional limitations, improve safety and independence with functional mobility, and return to PLOF.    PT Assessment  Patient needs continued PT services    Follow Up Recommendations  Home health PT    Does the patient have the potential to tolerate intense rehabilitation      Barriers to Discharge Decreased caregiver support      Equipment Recommendations  None recommended by PT    Recommendations for Other Services     Frequency Min 3X/week    Precautions / Restrictions Precautions Precautions: Fall Restrictions Weight Bearing Restrictions: No   Pertinent Vitals/Pain 10/10 - all over, meds received from nursing      Mobility  Bed Mobility Bed Mobility: Supine to Sit;Sitting - Scoot to Edge of Bed;Sit to Supine;Scooting to [facility] Supine to Sit: 5: Supervision;HOB flat;With rails Sitting - Scoot to Edge of Bed: 5: Supervision;With rail Sit to Supine: 5: Supervision;With rail;HOB flat Scooting to HOB: 5: Supervision;With rail Details for Bed Mobility Assistance: Pt was able to  perform transfer to/from EOB with no cueing or physical assist from therapist. Transfers Transfers: Sit to Stand;Stand to Sit Sit to Stand: 4: Min guard;From bed;With upper extremity assist Stand to Sit: 5: Supervision;To bed;With upper extremity assist Details for Transfer Assistance: Pt performed transfers to/from sitting with no cueing and no physical assist from therapist. Ambulation/Gait Ambulation/Gait Assistance: 4: Min guard Ambulation Distance (Feet): 10 Feet Assistive device: None Ambulation/Gait Assistance Details: 5 feet x2 at EOB due to limited O2 tubing and pain/nausea. Pt demonstrated good balance with no assist needed to walk forwards and backwards. Gait Pattern: Step-through pattern;Decreased stride length;Wide base of support Gait velocity: decreased Stairs: No    Exercises     PT Diagnosis: Difficulty walking;Acute pain  PT Problem List: Decreased strength;Decreased activity tolerance;Decreased range of motion;Decreased balance;Decreased mobility;Decreased knowledge of use of DME;Decreased safety awareness;Obesity;Pain PT Treatment Interventions: DME instruction;Gait training;Stair training;Functional mobility training;Therapeutic activities;Therapeutic exercise;Neuromuscular re-education;Patient/family education     PT Goals(Current goals can be found in the care plan section) Acute Rehab PT Goals Patient Stated Goal: To return home PT Goal Formulation: With patient Time For Goal Achievement: 05/03/13 Potential to Achieve Goals: Good  Visit Information  Last PT Received On: 04/26/13 Assistance Needed: +1 History of Present Illness: Pt is a 42 y/o female admitted for nausea/vomiting, constipation and dehydration. Pt is also experiencing a lupus flare-up as she was unable to keep her medications down at home.       Prior Functioning  Home Living Family/patient expects to be discharged to:: Private residence Living Arrangements: Alone Available Help at  Discharge: Family;Available PRN/intermittently Type of Home: House Home Access:  Stairs to enter [employer] of Steps: 3 Entrance Stairs-Rails: Right Home Layout: One level Home Equipment: Walker - 4 wheels;Bedside commode;Shower seat;Cane - quad Lexicographer) Additional Comments: Pt has an aide that comes 5x/week for a couple hours each day to assist with cooking, cleaning, getting in/out of shower. Prior Function Level of Independence: Independent with assistive device(s) Comments: mostly uses a QC due to O2 tank - says rollator is too much for her to handle with the O2 tank as well. Communication Communication: No difficulties Dominant Hand: Right    Cognition  Cognition Arousal/Alertness: Awake/alert Behavior During Therapy: WFL for tasks assessed/performed Overall Cognitive Status: Within Functional Limits for tasks assessed    Extremity/Trunk Assessment Upper Extremity Assessment Upper Extremity Assessment: LUE deficits/detail LUE Deficits / Details: Residual weakness from CVA in April. Pain with testing UE's due to lupus flare-up Lower Extremity Assessment Lower Extremity Assessment: LLE deficits/detail LLE Deficits / Details: Residual weakness from CVA in April. Difficulty in MMT LE's due to pain from lupus flare-up. Overall strength of bilateral LE's 4-/5 at this time. Cervical / Trunk Assessment Cervical / Trunk Assessment: Normal   Balance Balance Balance Assessed: Yes Static Sitting Balance Static Sitting - Balance Support: No upper extremity supported;Feet supported Static Sitting - Level of Assistance: 7: Independent Static Sitting - Comment/# of Minutes: 5 Static Standing Balance Static Standing - Balance Support: No upper extremity supported Static Standing - Level of Assistance: 5: Stand by assistance Static Standing - Comment/# of Minutes: 2  End of Session PT - End of Session Equipment Utilized During Treatment: Gait belt;Oxygen Activity Tolerance:  Patient tolerated treatment well;Patient limited by pain Patient left: in bed;with call bell/phone within reach Nurse Communication: Mobility status  GP     Ruthann Cancer 04/26/2013, 4:22 PM  Ruthann Cancer, PT, DPT Acute Rehabilitation Services

## 2013-04-26 NOTE — Progress Notes (Signed)
NUTRITION FOLLOW UP  Intervention:   1. Continue Resource Breeze po BID, each supplement provides 250 kcal and 9 grams of protein.   NUTRITION DIAGNOSIS:  Inadequate oral intake related to N/V as evidenced by pt report.   Goal:  PO intake to meet >/=90% estimated nutrition needs. improving  Monitor:  Po intake, weight trends, labs, I/O's  Assessment:   Doing a little betting with meals, diet has been advanced to bland. Pt states she like the Resource Breeze supplements and drinks them when she is unable to eat much else.  Likely with gastroparesis, unable to do HIDA with weight. Unable to tolerate Reglan in the past. Trial of erythromycin.   Height: Ht Readings from Last 1 Encounters:  04/21/13 5\' 8"  (1.727 m)    Weight Status:   Wt Readings from Last 1 Encounters:  04/26/13 379 lb 6.6 oz (172.1 kg)    Re-estimated needs:  Kcal: 2200-2400  Protein: 80-90 gm  Fluid: 2.2-2.4 L  Skin: intact   Diet Order: Parke Simmers   Intake/Output Summary (Last 24 hours) at 04/26/13 1026 Last data filed at 04/26/13 0859  Gross per 24 hour  Intake    700 ml  Output      1 ml  Net    699 ml    Last BM: 9/23   Labs:   Recent Labs Lab 04/21/13 1319 04/22/13 0619  NA 141 137  K 3.8 4.3  CL 104 102  CO2 29 25  BUN 6 9  CREATININE 0.89 0.85  CALCIUM 9.2 9.4  GLUCOSE 171* 252*    CBG (last 3)   Recent Labs  04/25/13 1657 04/25/13 2135 04/26/13 0759  GLUCAP 243* 122* 99    Scheduled Meds: . azaTHIOprine  100 mg Oral BID  . carvedilol  3.125 mg Oral BID WC  . citalopram  10 mg Oral QPM  . clopidogrel  75 mg Oral Daily  . docusate sodium  100 mg Oral BID  . erythromycin  500 mg Intravenous TID AC  . feeding supplement  1 Container Oral BID BM  . furosemide  40 mg Oral Daily  . hydroxychloroquine  200 mg Oral BID  . insulin aspart  0-15 Units Subcutaneous TID WC  . insulin aspart  0-5 Units Subcutaneous QHS  . insulin aspart  15 Units Subcutaneous TID WC  .  insulin glargine  15 Units Subcutaneous BH-q7a  . lubiprostone  24 mcg Oral BID WC  . polyethylene glycol  17 g Oral BID  . predniSONE  50 mg Oral Q breakfast  . sorbitol  30 mL Oral Once  . sucralfate  1 g Oral TID WC & HS    Continuous Infusions: . sodium chloride 20 mL/hr at 04/25/13 0800    Isabell Jarvis RD, LDN Pager [phone] After Hours pager [phone]

## 2013-04-26 NOTE — Care Management Note (Unsigned)
    Page 1 of 1   04/26/2013     4:51:40 PM   CARE MANAGEMENT NOTE 04/26/2013  Patient:  [facility]   Account Number:  [id number]  Date Initiated:  04/26/2013  Documentation initiated by:  Letha Cape  Subjective/Objective Assessment:   dx abd pain  admit- lives alone.     Action/Plan:   pt eval- rec hhpt  pt does not have qualifying dx for mcd to pay for pt. may try to do outpt pt.   Anticipated DC Date:  04/27/2013   Anticipated DC Plan:  HOME W HOME HEALTH SERVICES      DC Planning Services  CM consult      [employer] Choice  HOME HEALTH   Choice offered to / List presented to:  C-1 Patient           Status of service:  In process, will continue to follow Medicare Important Message given?   (If response is "NO", the following Medicare IM given date fields will be blank) Date Medicare IM given:   Date Additional Medicare IM given:    Discharge Disposition:    Per UR Regulation:  Reviewed for med. necessity/level of care/duration of stay  If discussed at Long Length of Stay Meetings, dates discussed:    Comments:  04/26/13 16:43 Letha Cape RN, BSN [phone] patient lives alone, per physical therapy recs hhpt, patient chose [facility], referral made to [facility], Patient does not have qualifying dx for hhpt so unable to have hhpt, maybe patient can have outp pt.

## 2013-04-26 NOTE — Progress Notes (Signed)
[facility] Daily Rounding Note  04/26/2013, 10:14 AM  SUBJECTIVE:       Feels like she needs to have BM, no BM since diarrhea near time of admission.  On multiple laxatives.  Nausea better but still present. Had bout of upper abdominal pain and some nausea after eating eggs this AM.  Nausea better after Zofran.   OBJECTIVE:         Vital signs in last 24 hours:    Temp:  [98.5 F (36.9 C)-98.7 F (37.1 C)] 98.6 F (37 C) (09/26 0512) Pulse Rate:  [76-87] 87 (09/26 0812) Resp:  [18-20] 20 (09/26 0512) BP: (135-152)/(74-87) 149/87 mmHg (09/26 0812) SpO2:  [97 %-100 %] 100 % (09/26 0512) Weight:  [172.1 kg (379 lb 6.6 oz)] 172.1 kg (379 lb 6.6 oz) (09/26 0406) Last BM Date: 04/23/13 General: looks well.  comfortable Heart: RRR Chest: clear bil.  No cough or dyspnea Abdomen: soft, obese, mild -moderately tender in epigastrum   Extremities: no CCE Neuro/Psych:  Pleasant, fully alert.   Intake/Output from previous day: 09/25 0701 - 09/26 0700 In: 640 [P.O.:480; I.V.:60; IV Piggyback:100] Out: 1 [Urine:1]  Intake/Output this shift: Total I/O In: 360 [P.O.:360] Out: -   Meds:    . azaTHIOprine  100 mg Oral BID  . carvedilol  3.125 mg Oral BID WC  . citalopram  10 mg Oral QPM  . clopidogrel  75 mg Oral Daily  . docusate sodium  100 mg Oral BID  . erythromycin  500 mg Intravenous TID AC  . famotidine  20 mg Oral BID  . feeding supplement  1 Container Oral BID BM  . furosemide  40 mg Oral Daily  . hydroxychloroquine  200 mg Oral BID  . insulin aspart  0-15 Units Subcutaneous TID WC  . insulin aspart  0-5 Units Subcutaneous QHS  . insulin aspart  15 Units Subcutaneous TID WC  . insulin glargine  15 Units Subcutaneous BH-q7a  . lubiprostone  24 mcg Oral BID WC  . polyethylene glycol  17 g Oral BID  . predniSONE  50 mg Oral Q breakfast  . sorbitol  30 mL Oral Once  . sorbitol  30 mL Oral Daily  . sucralfate  1 g Oral TID WC & HS   Prn meds albuterol, ALPRAZolam,  hydrALAZINE, HYDROmorphone (DILAUDID) injection, magnesium hydroxide, ondansetron (ZOFRAN) IV, promethazine  Lab Results:  Recent Labs  04/24/13 1144 04/25/13 0849  WBC 10.5 10.9*  HGB 8.8* 8.8*  HCT 27.6* 28.2*  PLT 253 247    ASSESMENT: * Chronic n/v in diabetic. Reflux esophagitis on 02/26/13 EGD. Taking PPI and carafate at home. Did not tolerate reglan. Unable to pursue any nuclear studies due to morbid obesity. Pt had markedly elevated Transaminases in July, ALT has continued to be intermittently elevated. Despite gen surgery notes, still think we need to consider biliary source for her ongoing upper abd pain and N/V. Probable diabetic gastroparesis, however this is not proven. Some improvement in last 36 hours.  Started IV erythromycin yesterday. High liklihood of recurrent acute sxs GI meds include Pepcid 20 mg BID Carafate, BID Miralax, colace po BID, Amitiza, prn IV Zofran.  * Benign liver mass. Radioablation completed at [facility] on Wednesday, one week ago. Has October follow up there.  * Small volume blood with diarhea PTA. No plans for endoscopic workup unless this persists.  * Normocytic anemia. Iron studies of 9/6 with low TIBC and normal ferritin, Iron, Iron saturation.  *  Sjogren's, polymyositis.  * Chronic Plavix  * DM. No insulin use at home.  *  OSA.  Pt uses home CPAP and home O2.  She has not been placed on CPAP during this hospitalization     PLAN: *  Will stop Sorbital, this tends to cause gas and is not best choice of laxative for this pt.  Also will stop Amitiza which is not effective (hospitalist rx'd this admission). Replace Pepcid with Protonix. Will add dulcolax PR  *  Pt may be best served by referral to motility MDs at [facility]  Pt had follow up with her surgeon at [facility], Dr Deveron Furlong today .  Unfortunately she will miss this. It has been rescheduled for 05/14/13 at 0930. I called [facility], the weight limit on one of there nuclear med scanners is  500# so she could have nuc med studies done there. *  Please send cc of discharge summary and her H & P to  Dr Marina Goodell [facility] [address]  [facility]  [place], [place] [address]  [phone]  [phone] (Fax)  So that he is aware of the extent of recent gastric issues and can make appropriate referrals within [facility].  *  Needs to have orders for CPAP    LOS: 5 days   Jennye Moccasin  04/26/2013, 10:14 AM Pager: [phone]    Attending physician's note   I have taken an interval history, reviewed the chart and examined the patient. I agree with the Advanced Practitioner's note, impression and recommendations. Continue with treatment for presumed gastroparesis with Zofran, Phenergan and erythromycin for now. PPI BID for GERD. DC Carafate. Miralax and Ducolax for constipation. Would resume CPAP. [facility] GI outpatient referral is recommended.  Venita Lick. Russella Dar, MD [facility]

## 2013-04-27 LAB — GLUCOSE, CAPILLARY
Glucose-Capillary: 106 mg/dL — ABNORMAL HIGH (ref 70–99)
Glucose-Capillary: 226 mg/dL — ABNORMAL HIGH (ref 70–99)
Glucose-Capillary: 246 mg/dL — ABNORMAL HIGH (ref 70–99)

## 2013-04-27 MED ORDER — BISACODYL 10 MG RE SUPP
10.0000 mg | Freq: Once | RECTAL | Status: AC
  Administered 2013-04-27: 10 mg via RECTAL
  Filled 2013-04-27: qty 1

## 2013-04-27 MED ORDER — INSULIN ASPART 100 UNIT/ML ~~LOC~~ SOLN
16.0000 [IU] | Freq: Three times a day (TID) | SUBCUTANEOUS | Status: DC
  Administered 2013-04-27 – 2013-04-28 (×4): 16 [IU] via SUBCUTANEOUS

## 2013-04-27 MED ORDER — INSULIN GLARGINE 100 UNIT/ML ~~LOC~~ SOLN
20.0000 [IU] | Freq: Every day | SUBCUTANEOUS | Status: DC
  Filled 2013-04-27: qty 0.2

## 2013-04-27 MED ORDER — PREDNISONE 20 MG PO TABS
40.0000 mg | ORAL_TABLET | Freq: Every day | ORAL | Status: DC
  Administered 2013-04-28: 40 mg via ORAL
  Filled 2013-04-27 (×2): qty 2

## 2013-04-27 MED ORDER — PROMETHAZINE HCL 25 MG PO TABS
25.0000 mg | ORAL_TABLET | Freq: Four times a day (QID) | ORAL | Status: DC | PRN
  Administered 2013-04-27: 25 mg via ORAL
  Filled 2013-04-27: qty 1

## 2013-04-27 MED ORDER — FUROSEMIDE 40 MG PO TABS
40.0000 mg | ORAL_TABLET | Freq: Two times a day (BID) | ORAL | Status: DC
  Administered 2013-04-27: 40 mg via ORAL
  Filled 2013-04-27 (×4): qty 1

## 2013-04-27 MED ORDER — ONDANSETRON 4 MG PO TBDP
4.0000 mg | ORAL_TABLET | Freq: Three times a day (TID) | ORAL | Status: DC | PRN
  Filled 2013-04-27: qty 1

## 2013-04-27 MED ORDER — SENNOSIDES-DOCUSATE SODIUM 8.6-50 MG PO TABS
1.0000 | ORAL_TABLET | Freq: Two times a day (BID) | ORAL | Status: DC
  Administered 2013-04-27 (×2): 1 via ORAL
  Filled 2013-04-27 (×2): qty 1

## 2013-04-27 MED ORDER — HYDROCODONE-ACETAMINOPHEN 5-325 MG PO TABS
1.0000 | ORAL_TABLET | Freq: Four times a day (QID) | ORAL | Status: DC | PRN
  Administered 2013-04-27: 2 via ORAL
  Filled 2013-04-27 (×2): qty 2

## 2013-04-27 MED ORDER — INSULIN GLARGINE 100 UNIT/ML ~~LOC~~ SOLN
5.0000 [IU] | Freq: Once | SUBCUTANEOUS | Status: AC
  Administered 2013-04-27: 5 [IU] via SUBCUTANEOUS
  Filled 2013-04-27: qty 0.05

## 2013-04-27 MED ORDER — TRAMADOL HCL 50 MG PO TABS
50.0000 mg | ORAL_TABLET | Freq: Three times a day (TID) | ORAL | Status: DC | PRN
  Administered 2013-04-27: 50 mg via ORAL
  Filled 2013-04-27: qty 1

## 2013-04-27 MED ORDER — INSULIN ASPART 100 UNIT/ML ~~LOC~~ SOLN: 16.0000 [IU] | Freq: Three times a day (TID) | SUBCUTANEOUS | Status: DC

## 2013-04-27 MED ORDER — ERYTHROMYCIN BASE 250 MG PO TBEC
500.0000 mg | DELAYED_RELEASE_TABLET | Freq: Three times a day (TID) | ORAL | Status: DC
  Administered 2013-04-27 – 2013-04-28 (×3): 500 mg via ORAL
  Filled 2013-04-27 (×6): qty 2

## 2013-04-27 NOTE — Progress Notes (Addendum)
Cross cover [facility] Subjective: Patient seems very anxious about being discharged from the hospital tomorrow. She feels all the symptoms she was admitted with, are recurring including nausea and joint pains. The nausea seems to respond to Zofran. Complains of epigastric pain.  Objective: Vital signs in last 24 hours: Temp:  [98.4 F (36.9 C)-98.7 F (37.1 C)] 98.4 F (36.9 C) (09/27 0443) Pulse Rate:  [81-89] 82 (09/27 1754) Resp:  [16-18] 16 (09/27 0443) BP: (123-147)/(68-81) 143/78 mmHg (09/27 1754) SpO2:  [100 %] 100 % (09/27 0443) Weight:  [171.2 kg (377 lb 6.8 oz)] 171.2 kg (377 lb 6.8 oz) (09/27 0443) Last BM Date: 04/23/13  Intake/Output from previous day: 09/26 0701 - 09/27 0700 In: 840 [P.O.:840] Out: -  Intake/Output this shift: Total I/O In: 480 [P.O.:300; I.V.:180] Out: -   General appearance: alert, cooperative, appears older than stated age, distracted, fatigued and morbidly obese Resp: diminished breath sounds bilaterally Cardio: regular rate and rhythm, S1, S2 normal, no murmur, click, rub or gallop GI: soft, non-tender; bowel sounds normal; no masses,  no organomegaly  Lab Results:  Recent Labs  04/25/13 0849  WBC 10.9*  HGB 8.8*  HCT 28.2*  PLT 247   Medications: I have reviewed the patient's current medications.  Assessment/Plan: 1) Abdominal pain-multifactorial; constipations seems to have improved. Continue PPI's Francis Dowse has a history of esophagitis] Cannot be evaluated in radiology due to weight limitations. Probably has gallbladder disease.  2) ?Sjogren's syndrome vs Lupus on Plaquenil, steroids and Imuran, Ultram.  3) Chronic constipation: on multiple laxatives-Amitiza, Miralax and Senokot.  4) DM with gastroparesis on Erythromycin. 5) Immobility due to morbid obesity.  LOS: 6 days   Muzamil Harker 04/27/2013, 6:20 PM

## 2013-04-27 NOTE — Progress Notes (Signed)
Patient's IV infiltrated. IV taken out and warm compress applied. Skin clean, dry and intact. MD notified about loss of peripheral access, and orders received for IV prn medications switched to PO. Okay for no IV access at this time.  Aldean Ast

## 2013-04-27 NOTE — Progress Notes (Signed)
[facility] PROGRESS NOTE  [name] ZOX:[id number] DOB: October 11, 1970 DOA: 04/21/2013 PCP: Ron Parker, MD Brief HPI: [name] is a 42 y.o. female  With a hx of lupus, chronic systolic chf, htn, diabetes, and a hx of prior CVA who presents to the ED with recurrent abdominal pain with nausea/vomiting. Reports last bowel movement was yesterday, but bowel movement prior to that was over one week ago. Pt reports not tolerating oral meds, including her lupus meds. Consequently, the patient has noted marked diffuse joint pains related to lupus flare. Given inability to tolerate PO, the hospitalist was consulted for possible admission.  Assessment/Plan:  1. Abdominal pain/N/V -multifactorial with recurrent hospitalizations, Suspect Gastroparesis, other possibilities are Constipation,  Adrenal insufficiency and possibly GB disorder -unable to check Gastric emptying scan, HIDA scan due to weight limitations -on soft bland diet -continue Erythromycin, change to PO, tolerating diet currently, still with moderate nausea, QTC <480 -unable to tolerate Reglan due to Akathesia -Continue Prednisone-start gradual taper-[facility] consult, med titration appreciated  EGD 7/14 with mild esophagitis -options limited -surgical note appreciated -I called [facility] 9/26 and asked to Fax notes to [phone] for appt with Dr.Koch,  Attn Patrise -also on Senokot, Miralax, add dulcolax suppositary  2. BRBPR -no further episodes -possibly hemorrhoidal, Hb stable - ASA on hold, keep on Plavix for now unless active further bleeding ensues -[facility] consult appreciated  2. SLE  -Suspected mild  lupus flare from missing med doses due to vomiting -Resumed home immunomodulatersand now on higher dose of Prednisone, slwoly wean down to basal dose of 5mg    3. DM  Continue lantus, changed to 15units and meal coverage   Recent Labs  04/26/13 1755 04/26/13 2131 04/27/13 0815  GLUCAP 303* 150* 106*    4. Chronic systolic chf  -Last documented EF 55-60% in 3/14 -Compensated presently, off IVF -continue low dose PO lasix  5. HTN  -BP stable -Resumed home medications.   6. Indeterminate Liver lesion HCC vs Adenoma -Notes from previous admission reviewed. Followed by Dr. Braulio Bosch, [name] at [facility], completed Radiation for this last week, due for MRI liver next month at [facility]   7. DVT prophylaxis : resume lovenox  8. OSA -ordered CPAP   Code Status: Full  Family Communication: none at bedside Disposition Plan: Pending     Consultants:  [facility] pending  Procedures:  none  Antibiotics:  none  HPI/Subjective: Tolerated soft diet yesterday, still with severe intermittent diarrhea  Objective: Filed Vitals:   04/27/13 0805  BP: 140/68  Pulse: 84  Temp:   Resp:     Intake/Output Summary (Last 24 hours) at 04/27/13 1122 Last data filed at 04/26/13 1811  Gross per 24 hour  Intake    480 ml  Output      0 ml  Net    480 ml   Filed Weights   04/25/13 0508 04/26/13 0406 04/27/13 0443  Weight: 173.4 kg (382 lb 4.4 oz) 172.1 kg (379 lb 6.6 oz) 171.2 kg (377 lb 6.8 oz)    Exam:   General:  Alert afebrile restless , morbidly obese  Cardiovascular: s1s2/RRR  Respiratory: diminished BS at bases  Abdomen: soft , mild diffuse tenderness, ND, BS present  Ext: no edema c/c, ,    Data Reviewed: Basic Metabolic Panel:  Recent Labs Lab 04/21/13 1319 04/22/13 0619  NA 141 137  K 3.8 4.3  CL 104 102  CO2 29 25  GLUCOSE 171* 252*  BUN  6 9  CREATININE 0.89 0.85  CALCIUM 9.2 9.4   Liver Function Tests:  Recent Labs Lab 04/21/13 1631  AST 15  ALT 36*  ALKPHOS 101  BILITOT 0.6  PROT 7.3  ALBUMIN 3.2*    Recent Labs Lab 04/21/13 1631  LIPASE 19   No results found for this basename: AMMONIA,  in the last 168 hours CBC:  Recent Labs Lab 04/21/13 1319 04/22/13 0619 04/24/13 1144 04/25/13 0849  WBC 9.0 8.9 10.5  10.9*  HGB 9.3* 9.2* 8.8* 8.8*  HCT 28.2* 27.9* 27.6* 28.2*  MCV 83.4 83.0 86.5 86.8  PLT 248 254 253 247   Cardiac Enzymes:  Recent Labs Lab 04/21/13 1613  TROPONINI <0.30   BNP (last 3 results)  Recent Labs  02/25/13 1918 04/01/13 1228 04/18/13 2235  PROBNP 162.5* 29.6 231.5*   CBG:  Recent Labs Lab 04/26/13 0759 04/26/13 1223 04/26/13 1755 04/26/13 2131 04/27/13 0815  GLUCAP 99 200* 303* 150* 106*    Recent Results (from the past 240 hour(s))  CULTURE, BLOOD (ROUTINE X 2)     Status: None   Collection Time    04/19/13  3:20 AM      Result Value Range Status   Specimen Description BLOOD RIGHT HAND   Final   Special Requests BOTTLES DRAWN AEROBIC ONLY 5CC   Final   Culture  Setup Time     Final   Value: 04/19/2013 09:47     Performed at [facility]   Culture     Final   Value: NO GROWTH 5 DAYS     Performed at [facility]   Report Status 04/25/2013 FINAL   Final  CULTURE, BLOOD (ROUTINE X 2)     Status: None   Collection Time    04/19/13  3:25 AM      Result Value Range Status   Specimen Description BLOOD RIGHT ARM   Final   Special Requests BOTTLES DRAWN AEROBIC ONLY 5CC   Final   Culture  Setup Time     Final   Value: 04/19/2013 09:47     Performed at [facility]   Culture     Final   Value: NO GROWTH 5 DAYS     Performed at [facility]   Report Status 04/25/2013 FINAL   Final  URINE CULTURE     Status: None   Collection Time    04/22/13  2:20 AM      Result Value Range Status   Specimen Description URINE, CLEAN CATCH   Final   Special Requests ADDED 0310   Final   Culture  Setup Time     Final   Value: 04/22/2013 03:20     Performed at [facility] Count     Final   Value: NO GROWTH     Performed at [facility]   Culture     Final   Value: NO GROWTH     Performed at [facility]   Report Status 04/23/2013 FINAL   Final     Studies: No results found.  Scheduled  Meds: . azaTHIOprine  100 mg Oral BID  . carvedilol  3.125 mg Oral BID WC  . citalopram  10 mg Oral QPM  . clopidogrel  75 mg Oral Daily  . docusate sodium  100 mg Oral BID  . enoxaparin (LOVENOX) injection  85 mg Subcutaneous Q24H  . erythromycin  500 mg Oral TID AC  .  feeding supplement  1 Container Oral BID BM  . furosemide  40 mg Oral BID  . hydroxychloroquine  200 mg Oral BID  . insulin aspart  0-15 Units Subcutaneous TID WC  . insulin aspart  0-5 Units Subcutaneous QHS  . insulin aspart  16 Units Subcutaneous TID WC  . [START ON 04/28/2013] insulin glargine  20 Units Subcutaneous Daily  . lubiprostone  24 mcg Oral BID WC  . pantoprazole  40 mg Oral BID  . polyethylene glycol  17 g Oral BID  . predniSONE  50 mg Oral Q breakfast   Continuous Infusions: . sodium chloride 20 mL/hr at 04/27/13 1610    Principal Problem:   Constipation due to pain medication Active Problems:   IDDM   SLE   DM (diabetes mellitus)   HTN (hypertension)   Systolic CHF, acute on chronic   Abdominal pain, epigastric    Time spent: 25 minutes.     Zannie Cove  [facility] Pager [phone] If 7PM-7AM, please contact night-coverage at [email or web address], password [facility] 04/27/2013, 11:22 AM  LOS: 6 days

## 2013-04-28 DIAGNOSIS — E1143 Type 2 diabetes mellitus with diabetic autonomic (poly)neuropathy: Secondary | ICD-10-CM | POA: Diagnosis present

## 2013-04-28 DIAGNOSIS — E1149 Type 2 diabetes mellitus with other diabetic neurological complication: Principal | ICD-10-CM

## 2013-04-28 LAB — GLUCOSE, CAPILLARY: Glucose-Capillary: 100 mg/dL — ABNORMAL HIGH (ref 70–99)

## 2013-04-28 MED ORDER — PREDNISONE 20 MG PO TABS: 20.0000 mg | ORAL_TABLET | Freq: Every day | ORAL | Status: DC

## 2013-04-28 MED ORDER — PROMETHAZINE HCL 25 MG PO TABS: 25.0000 mg | ORAL_TABLET | Freq: Four times a day (QID) | ORAL | Status: DC | PRN

## 2013-04-28 MED ORDER — TRAMADOL HCL 50 MG PO TABS: 50.0000 mg | ORAL_TABLET | Freq: Three times a day (TID) | ORAL | Status: DC | PRN

## 2013-04-28 MED ORDER — ERYTHROMYCIN BASE 250 MG PO TBEC: 250.0000 mg | DELAYED_RELEASE_TABLET | Freq: Three times a day (TID) | ORAL | Status: DC

## 2013-04-28 MED ORDER — PANTOPRAZOLE SODIUM 40 MG PO TBEC: 40.0000 mg | DELAYED_RELEASE_TABLET | Freq: Two times a day (BID) | ORAL | Status: DC

## 2013-04-28 MED ORDER — ONDANSETRON 4 MG PO TBDP: 4.0000 mg | ORAL_TABLET | Freq: Three times a day (TID) | ORAL | Status: DC | PRN

## 2013-04-28 NOTE — Progress Notes (Signed)
[name] to be D/C'd Home per MD order.  Discussed with the patient and all questions fully answered.    Medication List    STOP taking these medications       enoxaparin 40 MG/0.4ML injection  Commonly known as:  LOVENOX     sucralfate 1 GM/10ML suspension  Commonly known as:  CARAFATE      TAKE these medications       alendronate 70 MG tablet  Commonly known as:  FOSAMAX  Take 70 mg by mouth every 7 (seven) days. thursdays. Take with a full glass of water on an empty stomach.     ALPRAZolam 0.5 MG tablet  Commonly known as:  XANAX  Take 0.5 mg by mouth 2 (two) times daily as needed for sleep or anxiety.     aspirin EC 81 MG tablet  Take 81 mg by mouth daily.     azaTHIOprine 50 MG tablet  Commonly known as:  IMURAN  Take 50 mg by mouth 2 (two) times daily.     cetirizine 10 MG tablet  Commonly known as:  ZYRTEC  Take 10 mg by mouth daily.     citalopram 10 MG tablet  Commonly known as:  CELEXA  Take 1 tablet (10 mg total) by mouth every evening.     clopidogrel 75 MG tablet  Commonly known as:  PLAVIX  Take 75 mg by mouth daily.     COREG 3.125 MG tablet  Generic drug:  carvedilol  Take 3.125 mg by mouth 2 (two) times daily with a meal.     cycloSPORINE 0.05 % ophthalmic emulsion  Commonly known as:  RESTASIS  Place 1 drop into both eyes 3 (three) times daily.     erythromycin 250 MG EC tablet  Commonly known as:  ERY-TAB  Take 1 tablet (250 mg total) by mouth 3 (three) times daily before meals. For 10days     famotidine 20 MG tablet  Commonly known as:  PEPCID  Take 20 mg by mouth 2 (two) times daily.     gabapentin 300 MG capsule  Commonly known as:  NEURONTIN  Take 300 mg by mouth 2 (two) times daily.     hydroxychloroquine 200 MG tablet  Commonly known as:  PLAQUENIL  Take 200 mg by mouth 2 (two) times daily.     insulin regular human CONCENTRATED 500 UNIT/ML Soln injection  Commonly known as:  HUMULIN R  Inject 0.03 mLs (15 Units total)  into the skin 3 (three) times daily with meals. Please draw up as though it is just 3 units at a time.     isosorbide-hydrALAZINE 20-37.5 MG per tablet  Commonly known as:  BIDIL  Take 1 tablet by mouth 3 (three) times daily.     KLOR-CON M20 PO  Take 20 mEq by mouth 2 (two) times daily.     multivitamin with minerals Tabs tablet  Take 1 tablet by mouth daily.     ondansetron 4 MG disintegrating tablet  Commonly known as:  ZOFRAN-ODT  Take 1 tablet (4 mg total) by mouth every 8 (eight) hours as needed.     ondansetron 4 MG tablet  Commonly known as:  ZOFRAN  Take 1 tablet (4 mg total) by mouth every 6 (six) hours as needed for nausea.     oxyCODONE-acetaminophen 5-325 MG per tablet  Commonly known as:  PERCOCET/ROXICET  Take 1 tablet by mouth every 4 (four) hours as needed for pain.  pantoprazole 40 MG tablet  Commonly known as:  PROTONIX  Take 1 tablet (40 mg total) by mouth 2 (two) times daily.     polyethylene glycol packet  Commonly known as:  MIRALAX / GLYCOLAX  Take 17 g by mouth 2 (two) times daily.     predniSONE 20 MG tablet  Commonly known as:  DELTASONE  Take 1 tablet (20 mg total) by mouth daily with breakfast. For 10days then cut down to 10mg  daily for 1 week then back to 5mg  daily     PROAIR HFA 108 (90 BASE) MCG/ACT inhaler  Generic drug:  albuterol  Inhale 2 puffs into the lungs every 6 (six) hours as needed. For shortness of breath/wheeze     promethazine 25 MG tablet  Commonly known as:  PHENERGAN  Take 1 tablet (25 mg total) by mouth every 6 (six) hours as needed (refractory nausea).     senna 8.6 MG Tabs tablet  Commonly known as:  SENOKOT  Take 2 tablets by mouth daily as needed (constipation).     torsemide 20 MG tablet  Commonly known as:  DEMADEX  Take 1 tablet (20 mg total) by mouth 2 (two) times daily.     traMADol 50 MG tablet  Commonly known as:  ULTRAM  Take 1 tablet (50 mg total) by mouth 3 (three) times daily as needed.      traZODone 50 MG tablet  Commonly known as:  DESYREL  Take 1 tablet (50 mg total) by mouth at bedtime.        VVS, Skin clean, dry and intact without evidence of skin break down, no evidence of skin tears noted. IV catheter discontinued intact. Site without signs and symptoms of complications. Dressing and pressure applied.  An After Visit Summary was printed and given to the patient. Follow up appointments , new prescriptions and medication administration times given Patient escorted via WC, and D/C home via private auto.  Cindra Eves, RN 04/28/2013 9:39 AM

## 2013-04-28 NOTE — Progress Notes (Signed)
Patient slept throughout night with CPAP. Zero distress noted.

## 2013-04-29 ENCOUNTER — Encounter: Payer: Self-pay | Admitting: Endocrinology

## 2013-04-29 ENCOUNTER — Ambulatory Visit (INDEPENDENT_AMBULATORY_CARE_PROVIDER_SITE_OTHER): Payer: Medicaid Other | Admitting: Endocrinology

## 2013-04-29 VITALS — BP 122/74 | HR 78 | Wt 383.0 lb

## 2013-04-29 DIAGNOSIS — E109 Type 1 diabetes mellitus without complications: Secondary | ICD-10-CM

## 2013-04-29 NOTE — Progress Notes (Signed)
Subjective:    Patient ID: [name], female    DOB: 1970/10/12, 42 y.o.   MRN: [id number]  HPI Pt returns for f/u of insulin-requiring DM (dx'ed 2011; she has mild if any neuropathy of the lower extremities; she has no known associated complications; she has never had severe hypoglycemia or DKA).  She was on a high dosage of U-500.  However, she then lost weight, and her insulin requirement decreased.  she was recently restarted on the U-500.  She was recently hospitalized for exacerbation of her SLE.  cbg's have been as high as 300.   Past Medical History  Diagnosis Date  . Bell's palsy 08/09/2010  . SLE 08/09/2010  . SJOGREN'S SYNDROME 08/09/2010  . Hypertension   . High cholesterol   . Anginal pain   . Obstructive sleep apnea on CPAP 2011  . Anxiety   . GERD (gastroesophageal reflux disease)   . CHF (congestive heart failure)   . Acute renal failure      Intractable nausea vomiting secondary to diabetic gastroparesis causing dehydration and acute renal failure /notes 04/01/2013  . Liver mass     biopsied 03/2013 at [facility], not malignant.  is to undergo radiologic ablation of the mass in sept/October 2014.   Marland Kitchen Pneumonia 2000's    "once" (04/01/2013)  . On home oxygen therapy     "2L 24/7" (04/01/2013)  . Diabetic gastroparesis associated with type 2 diabetes mellitus     this is presumed diagnoses, not confirmed by any studies.   . Migraines     "maybe a couple times/yr" (04/01/2013)  . Stroke 2010; 10/2012    "left side is still weak from it, never fully regained full strength; no additions from stroke 10/2012"  . Obesity     Past Surgical History  Procedure Laterality Date  . Ectopic pregnancy surgery  1999  . Cardiac catheterization  02/07/12  . Hernia repair  1980's    umbilical  . Epidermoid cyst excision  2007     left breast  . Esophagogastroduodenoscopy N/A 02/26/2013    Procedure: ESOPHAGOGASTRODUODENOSCOPY (EGD);  Surgeon: Hilarie Fredrickson, MD;  Location: [facility] ENDOSCOPY;   Service: Endoscopy;  Laterality: N/A;  . Breast cyst excision Left 2007    epidermoid    History   Social History  . Marital Status: Single    Spouse Name: N/A    Number of Children: N/A  . Years of Education: N/A   Occupational History  .      student   Social History Main Topics  . Smoking status: Former Smoker -- 3 years    Types: Cigarettes    Quit date: 06/01/1990  . Smokeless tobacco: Never Used  . Alcohol Use: No  . Drug Use: No  . Sexual Activity: Not Currently   Other Topics Concern  . Not on file   Social History Narrative   Regular exercise-yes    Current Outpatient Prescriptions on File Prior to Visit  Medication Sig Dispense Refill  . albuterol (PROAIR HFA) 108 (90 BASE) MCG/ACT inhaler Inhale 2 puffs into the lungs every 6 (six) hours as needed. For shortness of breath/wheeze      . alendronate (FOSAMAX) 70 MG tablet Take 70 mg by mouth every 7 (seven) days. thursdays. Take with a full glass of water on an empty stomach.      . ALPRAZolam (XANAX) 0.5 MG tablet Take 0.5 mg by mouth 2 (two) times daily as needed for sleep or anxiety.       Marland Kitchen  aspirin EC 81 MG tablet Take 81 mg by mouth daily.      Marland Kitchen azaTHIOprine (IMURAN) 50 MG tablet Take 50 mg by mouth 2 (two) times daily.       . carvedilol (COREG) 3.125 MG tablet Take 3.125 mg by mouth 2 (two) times daily with a meal.      . cetirizine (ZYRTEC) 10 MG tablet Take 10 mg by mouth daily.      . citalopram (CELEXA) 10 MG tablet Take 1 tablet (10 mg total) by mouth every evening.  30 tablet  0  . clopidogrel (PLAVIX) 75 MG tablet Take 75 mg by mouth daily.      . cycloSPORINE (RESTASIS) 0.05 % ophthalmic emulsion Place 1 drop into both eyes 3 (three) times daily.      Marland Kitchen erythromycin (ERY-TAB) 250 MG EC tablet Take 1 tablet (250 mg total) by mouth 3 (three) times daily before meals. For 10days  30 tablet  0  . famotidine (PEPCID) 20 MG tablet Take 20 mg by mouth 2 (two) times daily.      Marland Kitchen gabapentin (NEURONTIN)  300 MG capsule Take 300 mg by mouth 2 (two) times daily.       . hydroxychloroquine (PLAQUENIL) 200 MG tablet Take 200 mg by mouth 2 (two) times daily.       . isosorbide-hydrALAZINE (BIDIL) 20-37.5 MG per tablet Take 1 tablet by mouth 3 (three) times daily.      . Multiple Vitamin (MULTIVITAMIN WITH MINERALS) TABS tablet Take 1 tablet by mouth daily.      . ondansetron (ZOFRAN) 4 MG tablet Take 1 tablet (4 mg total) by mouth every 6 (six) hours as needed for nausea.  20 tablet  0  . ondansetron (ZOFRAN-ODT) 4 MG disintegrating tablet Take 1 tablet (4 mg total) by mouth every 8 (eight) hours as needed.  30 tablet  0  . oxyCODONE-acetaminophen (PERCOCET/ROXICET) 5-325 MG per tablet Take 1 tablet by mouth every 4 (four) hours as needed for pain.      . pantoprazole (PROTONIX) 40 MG tablet Take 1 tablet (40 mg total) by mouth 2 (two) times daily.  30 tablet  0  . polyethylene glycol (MIRALAX / GLYCOLAX) packet Take 17 g by mouth 2 (two) times daily.  14 each  0  . Potassium Chloride Crys CR (KLOR-CON M20 PO) Take 20 mEq by mouth 2 (two) times daily.       . predniSONE (DELTASONE) 20 MG tablet Take 1 tablet (20 mg total) by mouth daily with breakfast. For 10days then cut down to 10mg  daily for 1 week then back to 5mg  daily  20 tablet  0  . promethazine (PHENERGAN) 25 MG tablet Take 1 tablet (25 mg total) by mouth every 6 (six) hours as needed (refractory nausea).  30 tablet  0  . senna (SENOKOT) 8.6 MG TABS tablet Take 2 tablets by mouth daily as needed (constipation).       . torsemide (DEMADEX) 20 MG tablet Take 1 tablet (20 mg total) by mouth 2 (two) times daily.  30 tablet  0  . traMADol (ULTRAM) 50 MG tablet Take 1 tablet (50 mg total) by mouth 3 (three) times daily as needed.  30 tablet  0  . traZODone (DESYREL) 50 MG tablet Take 1 tablet (50 mg total) by mouth at bedtime.  30 tablet  0   No current facility-administered medications on file prior to visit.    Allergies  Allergen Reactions  .  Oxycodone-Acetaminophen Hives    FYI: no reaction to hydrocodone; tolerates dilaudid  . Metoclopramide     Developed restless leg, akathisia type limb movements.     Family History  Problem Relation Age of Onset  . Diabetes Mother   . Hypertension Other   . Stroke Other   . Arthritis Other     BP 122/74  Pulse 78  Wt 383 lb (173.728 kg)  BMI 58.25 kg/m2  SpO2 95%  Review of Systems denies hypoglycemia.  She has gained weight.    Objective:   Physical Exam VITAL SIGNS:  See vs page GENERAL: no distress.  Morbid obesity.  Has 02 on. SKIN:  Insulin injection sites at the anterior abdomen are normal  Lab Results  Component Value Date   HGBA1C 6.4* 04/01/2013      Assessment & Plan:  DM: she needs increased rx.  SLE: the prednisone complicates the rx of DM, but she needs it.   CHF: this severely limits exercise rx of DM

## 2013-04-29 NOTE — Discharge Summary (Addendum)
Physician Discharge Summary  [name] RUE:[id number] DOB: [birth year] DOA: 04/21/2013  PCP: Ron Parker, MD  Admit date: 04/21/2013 Discharge date: 04/29/2013  Time spent: 45 minutes  Recommendations for Outpatient Follow-up:  1. PCP in 1 week 2. Dr.Koch in 2-3weeks  Discharge Diagnoses:    Abdominal pain/N/V   Suspected Diabetic gastroparesis   COnstipation   IDDM   SLE   DM (diabetes mellitus)   HTN (hypertension)   Diastolic CHF, chronic   Abdominal pain, epigastric   H/o CVA   Liver lesion, indeterminate HCC vs adenoma    Discharge Condition: stable  Diet recommendation: low sodium, carb modified  Filed Weights   04/26/13 0406 04/27/13 0443 04/28/13 0554  Weight: 172.1 kg (379 lb 6.6 oz) 171.2 kg (377 lb 6.8 oz) 171.913 kg (379 lb)    History of present illness:  Chief Complaint: Abdominal pain, nausea/vomiting  HPI: [name] is a 42 y.o. female  With a hx of lupus, chronic systolic chf, htn, diabetes, and a hx of prior CVA who presents to the ED with recurrent abdominal pain with nausea/vomiting. Reports last bowel movement was yesterday, but bowel movement prior to that was over one week ago. Pt reports not tolerating oral meds, including her lupus meds. Consequently, the patient has noted marked diffuse joint pains related to lupus flare. Given inability to tolerate PO, the hospitalist was consulted for possible admission.      Hospital Course:  1. Abdominal pain/N/V -multifactorial with recurrent hospitalizations, Suspect Gastroparesis, other possibilities are Constipation, Adrenal insufficiency and possibly GB disorder  -unable to check Gastric emptying scan, HIDA scan due to weight limitations  -Was followed by [facility] and also seen by Surgery this admission -Per Surgery low suspicion for Gall bladder related disease in the setting of lack of objective evidence -Diabetic gastroparesis most likely etiology of symptoms -she was treated with  supportive care, unable to tolerate Reglan due to Akathesia  -started on Erythromycin,  -by the time of discharge tolerating diet currently, still with intermittent nausea  -unable to tolerate Reglan due to Akathesia  -Continue Prednisone-cut down dose gradually to basal dose of 5mg   EGD 7/14 with mild esophagitis  -options limited  -surgical note appreciated  -I called [facility] 9/26 and have forwarded GI and Discharge Notes to [phone] for appt with Motility expert Dr.Koch at [facility] does not have the same weight limitations for Nuc Med-Gastric emptying/HIDA scan -also on Senokot, Miralax had BM day before discharge  2. BRBPR  -no further episodes  -possibly hemorrhoidal, Hb stable  - ASA was held for few days, no further bleeding, continued on Plavix -[facility] consult appreciated   3. SLE  -Suspected mild lupus flare from missing med doses due to vomiting  -Resumed home immunomodulatersand now on higher dose of Prednisone, slwoly wean down to basal dose of 5mg    4. DM  Continue lantus, changed to 15units and meal coverage   5. Chronic diastolic chf  -Last documented EF 55-60% in 3/14  -Compensated presently, off IVF  -continue low dose PO lasix   6. HTN  -BP stable  -Resumed home medications.   7. Indeterminate Liver lesion HCC vs Adenoma -Notes from previous admission reviewed. Followed by Dr. Braulio Bosch, [name] at [facility], completed Radiation for this last week, due for MRI liver next month at [facility]   8.  OSA  -continued on  CPAP   Consultations:  [facility]  [facility]  Discharge Exam: Filed  Vitals:   04/28/13 0641  BP: 128/78  Pulse:   Temp:   Resp:     General: AAOx3 Cardiovascular: S1S2/RRR Respiratory: CTAB  Discharge Instructions  Discharge Orders   Future Appointments Provider Department Dept Phone   05/13/2013 9:30 AM Romero Belling, MD [facility]IMARY [facility] ENDOCRINOLOGY [phone]   05/21/2013  9:45 AM Romero Belling, MD [place] [facility] ENDO[facility]OLOG[facility] [phone]   Future Orders Complete By Expires   Diet - low sodium heart healthy  As directed    Diet Carb Modified  As directed    Increase activity slowly  As directed        Medication List    STOP taking these medications       enoxaparin 40 MG/0.4ML injection  Commonly known as:  LOVENOX     insulin regular human CONCENTRATED 500 UNIT/ML Soln injection  Commonly known as:  HUMULIN R     sucralfate 1 GM/10ML suspension  Commonly known as:  CARAFATE      TAKE these medications       alendronate 70 MG tablet  Commonly known as:  FOSAMAX  Take 70 mg by mouth every 7 (seven) days. thursdays. Take with a full glass of water on an empty stomach.     ALPRAZolam 0.5 MG tablet  Commonly known as:  XANAX  Take 0.5 mg by mouth 2 (two) times daily as needed for sleep or anxiety.     aspirin EC 81 MG tablet  Take 81 mg by mouth daily.     azaTHIOprine 50 MG tablet  Commonly known as:  IMURAN  Take 50 mg by mouth 2 (two) times daily.     cetirizine 10 MG tablet  Commonly known as:  ZYRTEC  Take 10 mg by mouth daily.     citalopram 10 MG tablet  Commonly known as:  CELEXA  Take 1 tablet (10 mg total) by mouth every evening.     clopidogrel 75 MG tablet  Commonly known as:  PLAVIX  Take 75 mg by mouth daily.     COREG 3.125 MG tablet  Generic drug:  carvedilol  Take 3.125 mg by mouth 2 (two) times daily with a meal.     cycloSPORINE 0.05 % ophthalmic emulsion  Commonly known as:  RESTASIS  Place 1 drop into both eyes 3 (three) times daily.     erythromycin 250 MG EC tablet  Commonly known as:  ERY-TAB  Take 1 tablet (250 mg total) by mouth 3 (three) times daily before meals. For 10days     famotidine 20 MG tablet  Commonly known as:  PEPCID  Take 20 mg by mouth 2 (two) times daily.     gabapentin 300 MG capsule  Commonly known as:  NEURONTIN  Take 300 mg by mouth 2 (two) times daily.      hydroxychloroquine 200 MG tablet  Commonly known as:  PLAQUENIL  Take 200 mg by mouth 2 (two) times daily.     isosorbide-hydrALAZINE 20-37.5 MG per tablet  Commonly known as:  BIDIL  Take 1 tablet by mouth 3 (three) times daily.     KLOR-CON M20 PO  Take 20 mEq by mouth 2 (two) times daily.     multivitamin with minerals Tabs tablet  Take 1 tablet by mouth daily.     ondansetron 4 MG disintegrating tablet  Commonly known as:  ZOFRAN-ODT  Take 1 tablet (4 mg total) by mouth every 8 (eight) hours as needed.  ondansetron 4 MG tablet  Commonly known as:  ZOFRAN  Take 1 tablet (4 mg total) by mouth every 6 (six) hours as needed for nausea.     oxyCODONE-acetaminophen 5-325 MG per tablet  Commonly known as:  PERCOCET/ROXICET  Take 1 tablet by mouth every 4 (four) hours as needed for pain.     pantoprazole 40 MG tablet  Commonly known as:  PROTONIX  Take 1 tablet (40 mg total) by mouth 2 (two) times daily.     polyethylene glycol packet  Commonly known as:  MIRALAX / GLYCOLAX  Take 17 g by mouth 2 (two) times daily.     predniSONE 20 MG tablet  Commonly known as:  DELTASONE  Take 1 tablet (20 mg total) by mouth daily with breakfast. For 10days then cut down to 10mg  daily for 1 week then back to 5mg  daily     PROAIR HFA 108 (90 BASE) MCG/ACT inhaler  Generic drug:  albuterol  Inhale 2 puffs into the lungs every 6 (six) hours as needed. For shortness of breath/wheeze     promethazine 25 MG tablet  Commonly known as:  PHENERGAN  Take 1 tablet (25 mg total) by mouth every 6 (six) hours as needed (refractory nausea).     senna 8.6 MG Tabs tablet  Commonly known as:  SENOKOT  Take 2 tablets by mouth daily as needed (constipation).     torsemide 20 MG tablet  Commonly known as:  DEMADEX  Take 1 tablet (20 mg total) by mouth 2 (two) times daily.     traMADol 50 MG tablet  Commonly known as:  ULTRAM  Take 1 tablet (50 mg total) by mouth 3 (three) times daily as needed.      traZODone 50 MG tablet  Commonly known as:  DESYREL  Take 1 tablet (50 mg total) by mouth at bedtime.       Allergies  Allergen Reactions  . Oxycodone-Acetaminophen Hives    FYI: no reaction to hydrocodone; tolerates dilaudid  . Metoclopramide     Developed restless leg, akathisia type limb movements.        Follow-up Information   Follow up with Deveron Furlong, MD On 05/14/2013. (9:30 AM)    Specialty:  Surgery   Contact information:   [address] [facility] [place] [address] [phone]       Follow up with [facility], MD In 1 month. (GI, Motility specialist, Office should call you with appointment)    Specialty:  Internal Medicine       The results of significant diagnostics from this hospitalization (including imaging, microbiology, ancillary and laboratory) are listed below for reference.    Significant Diagnostic Studies: Dg Chest 2 View  04/21/2013   *RADIOLOGY REPORT*  Clinical Data: Chest pain  CHEST - 2 VIEW  Comparison: April 19, 2013  Findings: There is no focal infiltrate, pulmonary edema, or pleural effusion.  The mediastinal contour is normal.  The heart size is probably mildly enlarged.  The soft tissues and osseous structures are stable.  IMPRESSION: No acute cardiopulmonary disease identified.   Original Report Authenticated By: Sherian Rein, M.D.   Dg Chest 2 View  04/19/2013   CLINICAL DATA:  Hematemesis.  EXAM: CHEST  2 VIEW  COMPARISON:  CHEST x-ray 04/01/2013.  FINDINGS: Lung volumes are normal. No consolidative airspace disease. No pleural effusions. No evidence of pulmonary edema. Heart size appears mildly enlarged. Upper mediastinal contours are within normal limits.  IMPRESSION:  1. Mild cardiomegaly without radiographic evidence to suggest acute cardiopulmonary disease.   Electronically Signed   By: Trudie Reed M.D.   On: 04/19/2013 01:59   Dg Chest 2 View  04/01/2013   *RADIOLOGY REPORT*  Clinical Data:  Emesis, chest pain, shortness of breath  CHEST - 2 VIEW  Comparison: 02/26/2013  Findings: Mild cardiomegaly.  Vascular pattern normal.  Lungs clear.  IMPRESSION: No acute findings.   Original Report Authenticated By: Esperanza Heir, M.D.   Ct Angio Chest W/cm &/or Wo Cm  04/19/2013   CLINICAL DATA:  Chest pain. Shortness of Breath. Bloody emesis. Chills. Fever. Light headedness. Lupus. Sjogren syndrome.  EXAM: CT ANGIOGRAPHY CHEST  CT ABDOMEN AND PELVIS WITH CONTRAST  TECHNIQUE: Multidetector CT imaging of the chest was performed using the standard protocol during bolus administration of intravenous contrast. Multiplanar CT image reconstructions including MIPs were obtained to evaluate the vascular anatomy. Multidetector CT imaging of the abdomen and pelvis was performed using the standard protocol during bolus administration of intravenous contrast.  CONTRAST:  OMNIPAQUE IOHEXOL 350 MG/ML SOLN  COMPARISON:  Multiple exams, including the 03/26/2013 and 11/09/2011  FINDINGS: CTA CHEST FINDINGS  Body habitus and motion artifact render today's exam nondiagnostic for purposes of a evaluating for pulmonary embolus. Indistinct mixed density throughout the pulmonary arterial tree could represent artifact or embolus.  Cardiomegaly is present. No definite reversal of the interventricular septum although the cardiac structures are also obscured. There is unusual beam hardening along the trachea and subcarinal region.  Subsegmental atelectasis is present dependently in both lungs.  Review of the MIP images confirms the above findings.  CT ABDOMEN and PELVIS FINDINGS  Reduced exam predictive value due to patient body habitus  Mixed density 10.6 x 7.6 cm mass primarily in segment 4B of the liver. Previous size 9.6 x 8.1 cm.  Spleen, adrenal glands, pancreas, and kidneys unremarkable.  Stable small mass along the umbilicus. No dilated bowel or pathologic adenopathy observed. Appendix normal. Small focus of fat density  along the left adnexa, conceivably a small dermoid.  IMPRESSION: CTA CHEST IMPRESSION  1. Nondiagnostic in assessment for pulmonary embolus due to patient body habitus and motion artifact. Mixed density throughout the pulmonary arterial tree could be artifact or embolus. I do not think this would get better with immediate repetition of the exam. Consider ventilation perfusion lung scan. 2. Cardiomegaly. 3. Subsegmental atelectasis in both lungs.  CT ABDOMEN and PELVIS IMPRESSION  1. Overall similar size of the mixed density mass in segment 4B of the liver. 2. Stable nodule/mass along the umbilicus. 3. Suspected small left ovarian dermoid.   Electronically Signed   By: Herbie Baltimore   On: 04/19/2013 08:57   Ct Abdomen Pelvis W Contrast  04/19/2013   CLINICAL DATA:  Chest pain. Shortness of Breath. Bloody emesis. Chills. Fever. Light headedness. Lupus. Sjogren syndrome.  EXAM: CT ANGIOGRAPHY CHEST  CT ABDOMEN AND PELVIS WITH CONTRAST  TECHNIQUE: Multidetector CT imaging of the chest was performed using the standard protocol during bolus administration of intravenous contrast. Multiplanar CT image reconstructions including MIPs were obtained to evaluate the vascular anatomy. Multidetector CT imaging of the abdomen and pelvis was performed using the standard protocol during bolus administration of intravenous contrast.  CONTRAST:  OMNIPAQUE IOHEXOL 350 MG/ML SOLN  COMPARISON:  Multiple exams, including the 03/26/2013 and 11/09/2011  FINDINGS: CTA CHEST FINDINGS  Body habitus and motion artifact render today's exam nondiagnostic for purposes of a evaluating for pulmonary embolus. Indistinct  mixed density throughout the pulmonary arterial tree could represent artifact or embolus.  Cardiomegaly is present. No definite reversal of the interventricular septum although the cardiac structures are also obscured. There is unusual beam hardening along the trachea and subcarinal region.  Subsegmental atelectasis is  present dependently in both lungs.  Review of the MIP images confirms the above findings.  CT ABDOMEN and PELVIS FINDINGS  Reduced exam predictive value due to patient body habitus  Mixed density 10.6 x 7.6 cm mass primarily in segment 4B of the liver. Previous size 9.6 x 8.1 cm.  Spleen, adrenal glands, pancreas, and kidneys unremarkable.  Stable small mass along the umbilicus. No dilated bowel or pathologic adenopathy observed. Appendix normal. Small focus of fat density along the left adnexa, conceivably a small dermoid.  IMPRESSION: CTA CHEST IMPRESSION  1. Nondiagnostic in assessment for pulmonary embolus due to patient body habitus and motion artifact. Mixed density throughout the pulmonary arterial tree could be artifact or embolus. I do not think this would get better with immediate repetition of the exam. Consider ventilation perfusion lung scan. 2. Cardiomegaly. 3. Subsegmental atelectasis in both lungs.  CT ABDOMEN and PELVIS IMPRESSION  1. Overall similar size of the mixed density mass in segment 4B of the liver. 2. Stable nodule/mass along the umbilicus. 3. Suspected small left ovarian dermoid.   Electronically Signed   By: Herbie Baltimore   On: 04/19/2013 08:57   Dg Abd 2 Views  04/01/2013   *RADIOLOGY REPORT*  Clinical Data: Nausea and diarrhea  ABDOMEN - 2 VIEW  Comparison: None.  Findings: No free air.  Primarily gasless abdomen which by definition is nonspecific, although there is a small amount of gas into hepatic flexure of colon.  There is also a small amount of gas in the stomach and minimal gas into the splenic flexure of colon.  IMPRESSION: A technically nonspecific gas pattern as obstruction can rarely present with a gasless pattern.  However, there is a small amount of gas present and the findings are most likely within normal limits.   Original Report Authenticated By: Esperanza Heir, M.D.    Microbiology: Recent Results (from the past 240 hour(s))  URINE CULTURE     Status: None    Collection Time    04/22/13  2:20 AM      Result Value Range Status   Specimen Description URINE, CLEAN CATCH   Final   Special Requests ADDED 0310   Final   Culture  Setup Time     Final   Value: 04/22/2013 03:20     Performed at [facility] Count     Final   Value: NO GROWTH     Performed at [facility]   Culture     Final   Value: NO GROWTH     Performed at [facility]   Report Status 04/23/2013 FINAL   Final     Labs: Basic Metabolic Panel: No results found for this basename: NA, K, CL, CO2, GLUCOSE, BUN, CREATININE, CALCIUM, MG, PHOS,  in the last 168 hours Liver Function Tests: No results found for this basename: AST, ALT, ALKPHOS, BILITOT, PROT, ALBUMIN,  in the last 168 hours No results found for this basename: LIPASE, AMYLASE,  in the last 168 hours No results found for this basename: AMMONIA,  in the last 168 hours CBC:  Recent Labs Lab 04/24/13 1144 04/25/13 0849  WBC 10.5 10.9*  HGB 8.8* 8.8*  HCT 27.6* 28.2*  MCV 86.5 86.8  PLT 253 247   Cardiac Enzymes: No results found for this basename: CKTOTAL, CKMB, CKMBINDEX, TROPONINI,  in the last 168 hours BNP: BNP (last 3 results)  Recent Labs  02/25/13 1918 04/01/13 1228 04/18/13 2235  PROBNP 162.5* 29.6 231.5*   CBG:  Recent Labs Lab 04/27/13 0815 04/27/13 1138 04/27/13 1747 04/27/13 2155 04/28/13 0742  GLUCAP 106* 226* 246* 214* 100*       Signed:  Catherin Doorn  [facility] 04/29/2013, 3:52 PM

## 2013-04-29 NOTE — Patient Instructions (Addendum)
Please increase the U-500 insulin to 30 units (draw up as 6 units) 3 times a day (just before each meal).   We may need to increase this further, so please keep careful track of your blood sugar, and call if it stays over 200.  check your blood sugar 2 times a day.  vary the time of day when you check, between before the 3 meals, and at bedtime.  also check if you have symptoms of your blood sugar being too high or too low.  please keep a record of the readings and bring it to your next appointment here.  please call us sooner if you are having low blood sugar episodes, or if it stays over 200.   Please come back for a follow-up appointment in 2 weeks.

## 2013-05-09 ENCOUNTER — Encounter (INDEPENDENT_AMBULATORY_CARE_PROVIDER_SITE_OTHER): Payer: Self-pay | Admitting: Surgery

## 2013-05-13 ENCOUNTER — Ambulatory Visit: Payer: Medicaid Other | Admitting: Endocrinology

## 2013-05-14 ENCOUNTER — Ambulatory Visit (INDEPENDENT_AMBULATORY_CARE_PROVIDER_SITE_OTHER): Payer: Medicaid Other | Admitting: Surgery

## 2013-05-17 ENCOUNTER — Ambulatory Visit (INDEPENDENT_AMBULATORY_CARE_PROVIDER_SITE_OTHER): Payer: Medicaid Other | Admitting: Endocrinology

## 2013-05-17 VITALS — BP 132/80 | HR 104 | Wt 382.0 lb

## 2013-05-17 DIAGNOSIS — E109 Type 1 diabetes mellitus without complications: Secondary | ICD-10-CM

## 2013-05-17 NOTE — Patient Instructions (Addendum)
Please increase the U-500 insulin to 40 units (draw up as 8 units) 3 times a day (just before each meal).   We may need to increase this further, so please keep careful track of your blood sugar, and call if it stays over 200.  check your blood sugar 2 times a day.  vary the time of day when you check, between before the 3 meals, and at bedtime.  also check if you have symptoms of your blood sugar being too high or too low.  please keep a record of the readings and bring it to your next appointment here.  please call us sooner if you are having low blood sugar episodes, or if it stays over 200.   Please come back for a follow-up appointment in 1 month.

## 2013-05-17 NOTE — Progress Notes (Signed)
Subjective:    Patient ID: [name], female    DOB: 1971-03-05, 42 y.o.   MRN: [id number]  HPI Pt returns for f/u of insulin-requiring DM (dx'ed 2011, on a routine blood test; she has mild if any neuropathy of the lower extremities; she has no known associated complications; she has never had severe hypoglycemia or DKA).  She was on a high dosage of U-500.  However, she then lost weight, and her insulin requirement decreased.  she was recently restarted on the U-500 in mid-2014.  She has reduced prednisone to 5 mg qd.  no cbg record, but states cbg's are 167-220.  It is in general higher as the day goes on. Past Medical History  Diagnosis Date  . Bell's palsy 08/09/2010  . SLE 08/09/2010  . SJOGREN'S SYNDROME 08/09/2010  . Hypertension   . High cholesterol   . Anginal pain   . Obstructive sleep apnea on CPAP 2011  . Anxiety   . GERD (gastroesophageal reflux disease)   . CHF (congestive heart failure)   . Acute renal failure      Intractable nausea vomiting secondary to diabetic gastroparesis causing dehydration and acute renal failure /notes 04/01/2013  . Liver mass     biopsied 03/2013 at [facility], not malignant.  is to undergo radiologic ablation of the mass in sept/October 2014.   Marland Kitchen Pneumonia 2000's    "once" (04/01/2013)  . On home oxygen therapy     "2L 24/7" (04/01/2013)  . Diabetic gastroparesis associated with type 2 diabetes mellitus     this is presumed diagnoses, not confirmed by any studies.   . Migraines     "maybe a couple times/yr" (04/01/2013)  . Stroke 2010; 10/2012    "left side is still weak from it, never fully regained full strength; no additions from stroke 10/2012"  . Obesity     Past Surgical History  Procedure Laterality Date  . Ectopic pregnancy surgery  1999  . Cardiac catheterization  02/07/12  . Hernia repair  1980's    umbilical  . Epidermoid cyst excision  2007     left breast  . Esophagogastroduodenoscopy N/A 02/26/2013    Procedure:  ESOPHAGOGASTRODUODENOSCOPY (EGD);  Surgeon: Hilarie Fredrickson, MD;  Location: [facility] ENDOSCOPY;  Service: Endoscopy;  Laterality: N/A;  . Breast cyst excision Left 2007    epidermoid    History   Social History  . Marital Status: Single    Spouse Name: N/A    Number of Children: N/A  . Years of Education: N/A   Occupational History  .      student   Social History Main Topics  . Smoking status: Former Smoker -- 3 years    Types: Cigarettes    Quit date: 06/01/1990  . Smokeless tobacco: Never Used  . Alcohol Use: No  . Drug Use: No  . Sexual Activity: Not Currently   Other Topics Concern  . Not on file   Social History Narrative   Regular exercise-yes    Current Outpatient Prescriptions on File Prior to Visit  Medication Sig Dispense Refill  . albuterol (PROAIR HFA) 108 (90 BASE) MCG/ACT inhaler Inhale 2 puffs into the lungs every 6 (six) hours as needed. For shortness of breath/wheeze      . alendronate (FOSAMAX) 70 MG tablet Take 70 mg by mouth every 7 (seven) days. thursdays. Take with a full glass of water on an empty stomach.      . ALPRAZolam (XANAX) 0.5 MG tablet  Take 0.5 mg by mouth 2 (two) times daily as needed for sleep or anxiety.       Marland Kitchen aspirin EC 81 MG tablet Take 81 mg by mouth daily.      Marland Kitchen azaTHIOprine (IMURAN) 50 MG tablet Take 50 mg by mouth 2 (two) times daily.       . carvedilol (COREG) 3.125 MG tablet Take 3.125 mg by mouth 2 (two) times daily with a meal.      . cetirizine (ZYRTEC) 10 MG tablet Take 10 mg by mouth daily.      . citalopram (CELEXA) 10 MG tablet Take 1 tablet (10 mg total) by mouth every evening.  30 tablet  0  . clopidogrel (PLAVIX) 75 MG tablet Take 75 mg by mouth daily.      . cycloSPORINE (RESTASIS) 0.05 % ophthalmic emulsion Place 1 drop into both eyes 3 (three) times daily.      . famotidine (PEPCID) 20 MG tablet Take 20 mg by mouth 2 (two) times daily.      Marland Kitchen gabapentin (NEURONTIN) 300 MG capsule Take 300 mg by mouth 2 (two) times  daily.       . hydroxychloroquine (PLAQUENIL) 200 MG tablet Take 200 mg by mouth 2 (two) times daily.       . insulin regular human CONCENTRATED (HUMULIN R) 500 UNIT/ML SOLN injection Inject 40 Units into the skin 3 (three) times daily with meals. Because it is concentrated, please draw up as though it is just 8 units at a time.      . isosorbide-hydrALAZINE (BIDIL) 20-37.5 MG per tablet Take 1 tablet by mouth 3 (three) times daily.      . Multiple Vitamin (MULTIVITAMIN WITH MINERALS) TABS tablet Take 1 tablet by mouth daily.      . ondansetron (ZOFRAN) 4 MG tablet Take 1 tablet (4 mg total) by mouth every 6 (six) hours as needed for nausea.  20 tablet  0  . ondansetron (ZOFRAN-ODT) 4 MG disintegrating tablet Take 1 tablet (4 mg total) by mouth every 8 (eight) hours as needed.  30 tablet  0  . oxyCODONE-acetaminophen (PERCOCET/ROXICET) 5-325 MG per tablet Take 1 tablet by mouth every 4 (four) hours as needed for pain.      . pantoprazole (PROTONIX) 40 MG tablet Take 1 tablet (40 mg total) by mouth 2 (two) times daily.  30 tablet  0  . polyethylene glycol (MIRALAX / GLYCOLAX) packet Take 17 g by mouth 2 (two) times daily.  14 each  0  . Potassium Chloride Crys CR (KLOR-CON M20 PO) Take 20 mEq by mouth 2 (two) times daily.       . predniSONE (DELTASONE) 20 MG tablet Take 1 tablet (20 mg total) by mouth daily with breakfast. For 10days then cut down to 10mg  daily for 1 week then back to 5mg  daily  20 tablet  0  . promethazine (PHENERGAN) 25 MG tablet Take 1 tablet (25 mg total) by mouth every 6 (six) hours as needed (refractory nausea).  30 tablet  0  . senna (SENOKOT) 8.6 MG TABS tablet Take 2 tablets by mouth daily as needed (constipation).       . torsemide (DEMADEX) 20 MG tablet Take 1 tablet (20 mg total) by mouth 2 (two) times daily.  30 tablet  0  . traMADol (ULTRAM) 50 MG tablet Take 1 tablet (50 mg total) by mouth 3 (three) times daily as needed.  30 tablet  0  . traZODone (DESYREL) 50  MG  tablet Take 1 tablet (50 mg total) by mouth at bedtime.  30 tablet  0   No current facility-administered medications on file prior to visit.    Allergies  Allergen Reactions  . Oxycodone-Acetaminophen Hives    FYI: no reaction to hydrocodone; tolerates dilaudid  . Codeine Other (See Comments)    (Derm)  . Metoclopramide     Developed restless leg, akathisia type limb movements.   . Oxycodone-Acetaminophen Other (See Comments)   Family History  Problem Relation Age of Onset  . Diabetes Mother   . Hypertension Other   . Stroke Other   . Arthritis Other    BP 132/80  Pulse 104  Wt 382 lb (173.274 kg)  BMI 58.1 kg/m2  SpO2 95%  Review of Systems denies hypoglycemia and weight change.    Objective:   Physical Exam VITAL SIGNS:  See vs page GENERAL: no distress.  Obese.  Has 02 on PSYCH: Alert and oriented x 3.  Does not appear anxious nor depressed.  Lab Results  Component Value Date   HGBA1C 6.4* 04/01/2013      Assessment & Plan:  DM: she needs increased rx. This insulin regimen was chosen from multiple options, as it best matches her insulin to her changing requirements throughout the day.  The benefits of glycemic control must be weighed against the risks of hypoglycemia.   CHF: this severely limits exercise rx of her DM. Morbid obesity: this complicates the rx of DM

## 2013-05-21 ENCOUNTER — Ambulatory Visit: Payer: Medicaid Other | Admitting: Endocrinology

## 2013-05-22 ENCOUNTER — Ambulatory Visit (INDEPENDENT_AMBULATORY_CARE_PROVIDER_SITE_OTHER): Payer: Medicaid Other | Admitting: Surgery

## 2013-05-22 ENCOUNTER — Encounter (INDEPENDENT_AMBULATORY_CARE_PROVIDER_SITE_OTHER): Payer: Self-pay | Admitting: Surgery

## 2013-05-22 VITALS — BP 146/98 | HR 92 | Temp 98.4°F | Resp 20 | Ht 68.0 in | Wt 383.8 lb

## 2013-05-22 DIAGNOSIS — L723 Sebaceous cyst: Secondary | ICD-10-CM

## 2013-05-22 NOTE — Patient Instructions (Signed)
Hot compresses to the area on the back once or twice a day.  If it becomes infected or more swollen, have the surgeon at [facility] take a look at it to see if it needs to be drained.

## 2013-05-22 NOTE — Progress Notes (Signed)
Patient ID: [name], female   DOB: 1970/09/19, 42 y.o.   MRN: [id number]  Chief Complaint  Patient presents with  . New Evaluation    eval seb cyst on back    HPI [name] is a 42 y.o. female.  Referred by Dr. Della Goo for evaluation of a sebaceous cyst of the back.  HPI This is a 42 year old female with multiple very significant medical problems who is status post excision of a sebaceous cyst on her back. This was performed in the mid 1990s. She has developed a recurrent cyst near this area. It is mildly tender to palpation. There has been no significant drainage from the site.  The patient is followed at [facility] for multiple medical and surgical issues including a liver mass. She is scheduled to undergo radioablation of the liver mass in the next few months. The patient is on home oxygen therapy because of congestive heart failure. She does have OSA requiring CPAP.  She is also on prednisone 20 daily as well as Imuran.  Past Medical History  Diagnosis Date  . Bell's palsy 08/09/2010  . SLE 08/09/2010  . SJOGREN'S SYNDROME 08/09/2010  . Hypertension   . High cholesterol   . Anginal pain   . Obstructive sleep apnea on CPAP 2011  . Anxiety   . GERD (gastroesophageal reflux disease)   . CHF (congestive heart failure)   . Acute renal failure      Intractable nausea vomiting secondary to diabetic gastroparesis causing dehydration and acute renal failure /notes 04/01/2013  . Liver mass     biopsied 03/2013 at [facility], not malignant.  is to undergo radiologic ablation of the mass in sept/October 2014.   Marland Kitchen Pneumonia 2000's    "once" (04/01/2013)  . On home oxygen therapy     "2L 24/7" (04/01/2013)  . Diabetic gastroparesis associated with type 2 diabetes mellitus     this is presumed diagnoses, not confirmed by any studies.   . Migraines     "maybe a couple times/yr" (04/01/2013)  . Stroke 2010; 10/2012    "left side is still weak from it, never fully regained full  strength; no additions from stroke 10/2012"  . Obesity     Past Surgical History  Procedure Laterality Date  . Ectopic pregnancy surgery  1999  . Cardiac catheterization  02/07/12  . Hernia repair  1980's    umbilical  . Epidermoid cyst excision  2007     left breast  . Esophagogastroduodenoscopy N/A 02/26/2013    Procedure: ESOPHAGOGASTRODUODENOSCOPY (EGD);  Surgeon: Hilarie Fredrickson, MD;  Location: [facility] ENDOSCOPY;  Service: Endoscopy;  Laterality: N/A;  . Breast cyst excision Left 2007    epidermoid    Family History  Problem Relation Age of Onset  . Diabetes Mother   . Hypertension Other   . Stroke Other   . Arthritis Other     Social History History  Substance Use Topics  . Smoking status: Former Smoker -- 3 years    Types: Cigarettes    Quit date: 06/01/1990  . Smokeless tobacco: Never Used  . Alcohol Use: No    Allergies  Allergen Reactions  . Oxycodone-Acetaminophen Hives    FYI: no reaction to hydrocodone; tolerates dilaudid  . Codeine Other (See Comments)    (Derm)  . Metoclopramide     Developed restless leg, akathisia type limb movements.   . Oxycodone-Acetaminophen Other (See Comments)    Current Outpatient Prescriptions  Medication Sig Dispense Refill  .  albuterol (PROAIR HFA) 108 (90 BASE) MCG/ACT inhaler Inhale 2 puffs into the lungs every 6 (six) hours as needed. For shortness of breath/wheeze      . alendronate (FOSAMAX) 70 MG tablet Take 70 mg by mouth every 7 (seven) days. thursdays. Take with a full glass of water on an empty stomach.      . ALPRAZolam (XANAX) 0.5 MG tablet Take 0.5 mg by mouth 2 (two) times daily as needed for sleep or anxiety.       Marland Kitchen aspirin EC 81 MG tablet Take 81 mg by mouth daily.      Marland Kitchen azaTHIOprine (IMURAN) 50 MG tablet Take 50 mg by mouth 2 (two) times daily.       . carvedilol (COREG) 3.125 MG tablet Take 3.125 mg by mouth 2 (two) times daily with a meal.      . cetirizine (ZYRTEC) 10 MG tablet Take 10 mg by mouth daily.       . citalopram (CELEXA) 10 MG tablet Take 1 tablet (10 mg total) by mouth every evening.  30 tablet  0  . clopidogrel (PLAVIX) 75 MG tablet Take 75 mg by mouth daily.      . cycloSPORINE (RESTASIS) 0.05 % ophthalmic emulsion Place 1 drop into both eyes 3 (three) times daily.      . famotidine (PEPCID) 20 MG tablet Take 20 mg by mouth 2 (two) times daily.      Marland Kitchen gabapentin (NEURONTIN) 300 MG capsule Take 300 mg by mouth 2 (two) times daily.       . hydroxychloroquine (PLAQUENIL) 200 MG tablet Take 200 mg by mouth 2 (two) times daily.       . insulin regular human CONCENTRATED (HUMULIN R) 500 UNIT/ML SOLN injection Inject 40 Units into the skin 3 (three) times daily with meals. Because it is concentrated, please draw up as though it is just 8 units at a time.      . isosorbide-hydrALAZINE (BIDIL) 20-37.5 MG per tablet Take 1 tablet by mouth 3 (three) times daily.      . ondansetron (ZOFRAN) 4 MG tablet Take 1 tablet (4 mg total) by mouth every 6 (six) hours as needed for nausea.  20 tablet  0  . oxyCODONE-acetaminophen (PERCOCET/ROXICET) 5-325 MG per tablet Take 1 tablet by mouth every 4 (four) hours as needed for pain.      . pantoprazole (PROTONIX) 40 MG tablet Take 1 tablet (40 mg total) by mouth 2 (two) times daily.  30 tablet  0  . polyethylene glycol (MIRALAX / GLYCOLAX) packet Take 17 g by mouth 2 (two) times daily.  14 each  0  . Potassium Chloride Crys CR (KLOR-CON M20 PO) Take 20 mEq by mouth 2 (two) times daily.       . predniSONE (DELTASONE) 20 MG tablet Take 1 tablet (20 mg total) by mouth daily with breakfast. For 10days then cut down to 10mg  daily for 1 week then back to 5mg  daily  20 tablet  0  . promethazine (PHENERGAN) 25 MG tablet Take 1 tablet (25 mg total) by mouth every 6 (six) hours as needed (refractory nausea).  30 tablet  0  . torsemide (DEMADEX) 20 MG tablet Take 1 tablet (20 mg total) by mouth 2 (two) times daily.  30 tablet  0  . traMADol (ULTRAM) 50 MG tablet Take 1 tablet  (50 mg total) by mouth 3 (three) times daily as needed.  30 tablet  0  . traZODone (DESYREL) 50  MG tablet Take 1 tablet (50 mg total) by mouth at bedtime.  30 tablet  0   No current facility-administered medications for this visit.    Review of Systems Review of Systems  Blood pressure 146/98, pulse 92, temperature 98.4 F (36.9 C), temperature source Temporal, resp. rate 20, height 5\' 8"  (1.727 m), weight 383 lb 12.8 oz (174.091 kg).  Physical Exam Physical Exam Obese female in no apparent distress The patient is on oxygen but is in no respiratory distress. Examination of the middle of her back, there is a small punctate opening with a 1 cm sebaceous cyst. There is no sign of inflammation. There is minimal whitish sebum that able to be expressed from this area.  Data Reviewed none  Assessment    Recurrent sebaceous cyst mid back with no sign of infection. Extremely high risk for any type of surgical procedure     Plan    The patient should treat this area with a warm compress to allow this area to drain. There is no indication at this time for a surgical incision and drainage. If this area should worsen then she needs to be seen by a surgeon, either here or at [facility].        [name] K. 05/22/2013, 4:36 PM

## 2013-06-06 ENCOUNTER — Other Ambulatory Visit: Payer: Self-pay

## 2013-06-17 ENCOUNTER — Ambulatory Visit: Payer: Medicaid Other | Admitting: Endocrinology

## 2013-06-21 ENCOUNTER — Ambulatory Visit (INDEPENDENT_AMBULATORY_CARE_PROVIDER_SITE_OTHER): Payer: Medicaid Other | Admitting: Endocrinology

## 2013-06-21 ENCOUNTER — Encounter: Payer: Self-pay | Admitting: Endocrinology

## 2013-06-21 VITALS — BP 152/98 | HR 92 | Temp 98.3°F | Resp 16 | Ht 68.0 in | Wt 388.7 lb

## 2013-06-21 DIAGNOSIS — E119 Type 2 diabetes mellitus without complications: Secondary | ICD-10-CM

## 2013-06-21 NOTE — Patient Instructions (Addendum)
blood tests are being requested for you today.  We'll contact you with results.   check your blood sugar 2 times a day.  vary the time of day when you check, between before the 3 meals, and at bedtime.  also check if you have symptoms of your blood sugar being too high or too low.  please keep a record of the readings and bring it to your next appointment here.  please call us sooner if you are having low blood sugar episodes, or if it stays over 200.   Please come back for a follow-up appointment in 3 months.   

## 2013-06-21 NOTE — Progress Notes (Signed)
Subjective:    Patient ID: [name], female    DOB: November 26, 1970, 42 y.o.   MRN: [id number]  HPI Pt returns for f/u of insulin-requiring DM (dx'ed 2011, on a routine blood test; she has mild if any neuropathy of the lower extremities; she has no known associated complications; she has never had severe hypoglycemia or DKA).  She was on a high dosage of U-500.  However, in early 2014, she lost weight, and her insulin requirement decreased.  she was restarted on the U-500 in mid-2014.  no cbg record, but states cbg's are in the low-100's.  There is no trend throughout the day.   Past Medical History  Diagnosis Date  . Bell's palsy 08/09/2010  . SLE 08/09/2010  . SJOGREN'S SYNDROME 08/09/2010  . Hypertension   . High cholesterol   . Anginal pain   . Obstructive sleep apnea on CPAP 2011  . Anxiety   . GERD (gastroesophageal reflux disease)   . CHF (congestive heart failure)   . Acute renal failure      Intractable nausea vomiting secondary to diabetic gastroparesis causing dehydration and acute renal failure /notes 04/01/2013  . Liver mass     biopsied 03/2013 at [facility], not malignant.  is to undergo radiologic ablation of the mass in sept/October 2014.   Marland Kitchen Pneumonia 2000's    "once" (04/01/2013)  . On home oxygen therapy     "2L 24/7" (04/01/2013)  . Diabetic gastroparesis associated with type 2 diabetes mellitus     this is presumed diagnoses, not confirmed by any studies.   . Migraines     "maybe a couple times/yr" (04/01/2013)  . Stroke 2010; 10/2012    "left side is still weak from it, never fully regained full strength; no additions from stroke 10/2012"  . Obesity     Past Surgical History  Procedure Laterality Date  . Ectopic pregnancy surgery  1999  . Cardiac catheterization  02/07/12  . Hernia repair  1980's    umbilical  . Epidermoid cyst excision  2007     left breast  . Esophagogastroduodenoscopy N/A 02/26/2013    Procedure: ESOPHAGOGASTRODUODENOSCOPY (EGD);  Surgeon: Hilarie Fredrickson, MD;  Location: [facility] ENDOSCOPY;  Service: Endoscopy;  Laterality: N/A;  . Breast cyst excision Left 2007    epidermoid    History   Social History  . Marital Status: Single    Spouse Name: N/A    Number of Children: N/A  . Years of Education: N/A   Occupational History  .      student   Social History Main Topics  . Smoking status: Former Smoker -- 3 years    Types: Cigarettes    Quit date: 06/01/1990  . Smokeless tobacco: Never Used  . Alcohol Use: No  . Drug Use: No  . Sexual Activity: Not Currently   Other Topics Concern  . Not on file   Social History Narrative   Regular exercise-yes    Current Outpatient Prescriptions on File Prior to Visit  Medication Sig Dispense Refill  . albuterol (PROAIR HFA) 108 (90 BASE) MCG/ACT inhaler Inhale 2 puffs into the lungs every 6 (six) hours as needed. For shortness of breath/wheeze      . alendronate (FOSAMAX) 70 MG tablet Take 70 mg by mouth every 7 (seven) days. thursdays. Take with a full glass of water on an empty stomach.      . ALPRAZolam (XANAX) 0.5 MG tablet Take 0.5 mg by mouth 2 (two)  times daily as needed for sleep or anxiety.       Marland Kitchen aspirin EC 81 MG tablet Take 81 mg by mouth daily.      Marland Kitchen azaTHIOprine (IMURAN) 50 MG tablet Take 50 mg by mouth 2 (two) times daily.       . carvedilol (COREG) 3.125 MG tablet Take 3.125 mg by mouth 2 (two) times daily with a meal.      . cetirizine (ZYRTEC) 10 MG tablet Take 10 mg by mouth daily.      . citalopram (CELEXA) 10 MG tablet Take 1 tablet (10 mg total) by mouth every evening.  30 tablet  0  . clopidogrel (PLAVIX) 75 MG tablet Take 75 mg by mouth daily.      . cycloSPORINE (RESTASIS) 0.05 % ophthalmic emulsion Place 1 drop into both eyes 3 (three) times daily.      . famotidine (PEPCID) 20 MG tablet Take 20 mg by mouth 2 (two) times daily.      Marland Kitchen gabapentin (NEURONTIN) 300 MG capsule Take 300 mg by mouth 2 (two) times daily.       . hydroxychloroquine (PLAQUENIL) 200 MG  tablet Take 200 mg by mouth 2 (two) times daily.       . insulin regular human CONCENTRATED (HUMULIN R) 500 UNIT/ML SOLN injection Inject 40 Units into the skin 3 (three) times daily with meals. Because it is concentrated, please draw up as though it is just 8 units at a time.      . isosorbide-hydrALAZINE (BIDIL) 20-37.5 MG per tablet Take 1 tablet by mouth 3 (three) times daily.      . ondansetron (ZOFRAN) 4 MG tablet Take 1 tablet (4 mg total) by mouth every 6 (six) hours as needed for nausea.  20 tablet  0  . oxyCODONE-acetaminophen (PERCOCET/ROXICET) 5-325 MG per tablet Take 1 tablet by mouth every 4 (four) hours as needed for pain.      . pantoprazole (PROTONIX) 40 MG tablet Take 1 tablet (40 mg total) by mouth 2 (two) times daily.  30 tablet  0  . polyethylene glycol (MIRALAX / GLYCOLAX) packet Take 17 g by mouth 2 (two) times daily.  14 each  0  . Potassium Chloride Crys CR (KLOR-CON M20 PO) Take 20 mEq by mouth 2 (two) times daily.       . predniSONE (DELTASONE) 20 MG tablet Take 1 tablet (20 mg total) by mouth daily with breakfast. For 10days then cut down to 10mg  daily for 1 week then back to 5mg  daily  20 tablet  0  . promethazine (PHENERGAN) 25 MG tablet Take 1 tablet (25 mg total) by mouth every 6 (six) hours as needed (refractory nausea).  30 tablet  0  . torsemide (DEMADEX) 20 MG tablet Take 1 tablet (20 mg total) by mouth 2 (two) times daily.  30 tablet  0  . traMADol (ULTRAM) 50 MG tablet Take 1 tablet (50 mg total) by mouth 3 (three) times daily as needed.  30 tablet  0  . traZODone (DESYREL) 50 MG tablet Take 1 tablet (50 mg total) by mouth at bedtime.  30 tablet  0   No current facility-administered medications on file prior to visit.    Allergies  Allergen Reactions  . Oxycodone-Acetaminophen Hives    FYI: no reaction to hydrocodone; tolerates dilaudid  . Codeine Other (See Comments)    (Derm)  . Metoclopramide     Developed restless leg, akathisia type limb movements.    Marland Kitchen  Oxycodone-Acetaminophen Other (See Comments)    Family History  Problem Relation Age of Onset  . Diabetes Mother   . Hypertension Other   . Stroke Other   . Arthritis Other     BP 152/98  Pulse 92  Temp(Src) 98.3 F (36.8 C) (Oral)  Resp 16  Ht 5\' 8"  (1.727 m)  Wt 388 lb 11.2 oz (176.313 kg)  BMI 59.12 kg/m2  Review of Systems denies hypoglycemia and recent weight change.      Objective:   Physical Exam VITAL SIGNS:  See vs page GENERAL: no distress  Lab Results  Component Value Date   HGBA1C 6.4* 04/01/2013      Assessment & Plan:  DM: control is improved. This insulin regimen was chosen from multiple options, as it best matches her insulin to her changing requirements throughout the day.  The benefits of glycemic control must be weighed against the risks of hypoglycemia.   CHF: this severely limits exercise rx of her DM. Morbid obesity: this complicates the rx of DM

## 2013-06-24 ENCOUNTER — Other Ambulatory Visit: Payer: Self-pay | Admitting: Endocrinology

## 2013-06-24 MED ORDER — INSULIN REGULAR HUMAN (CONC) 500 UNIT/ML ~~LOC~~ SOLN: 45.0000 [IU] | Freq: Three times a day (TID) | SUBCUTANEOUS | Status: DC

## 2013-07-05 ENCOUNTER — Other Ambulatory Visit: Payer: Self-pay | Admitting: Endocrinology

## 2013-07-15 ENCOUNTER — Ambulatory Visit
Admission: RE | Admit: 2013-07-15 | Discharge: 2013-07-15 | Disposition: A | Discharge status: Home / Self Care | Payer: Medicaid Other | Source: Ambulatory Visit | Attending: Internal Medicine | Admitting: Internal Medicine

## 2013-07-15 ENCOUNTER — Other Ambulatory Visit: Payer: Self-pay | Admitting: Internal Medicine

## 2013-07-15 DIAGNOSIS — R0602 Shortness of breath: Secondary | ICD-10-CM

## 2013-07-15 DIAGNOSIS — R05 Cough: Secondary | ICD-10-CM

## 2013-07-15 DIAGNOSIS — R059 Cough, unspecified: Secondary | ICD-10-CM

## 2013-08-30 ENCOUNTER — Encounter: Payer: Self-pay | Admitting: Obstetrics & Gynecology

## 2013-08-30 ENCOUNTER — Ambulatory Visit (INDEPENDENT_AMBULATORY_CARE_PROVIDER_SITE_OTHER): Payer: Medicaid Other | Admitting: Obstetrics & Gynecology

## 2013-08-30 VITALS — BP 107/74 | HR 105 | Temp 98.9°F | Ht 68.0 in

## 2013-08-30 DIAGNOSIS — N898 Other specified noninflammatory disorders of vagina: Secondary | ICD-10-CM

## 2013-08-30 DIAGNOSIS — N939 Abnormal uterine and vaginal bleeding, unspecified: Secondary | ICD-10-CM

## 2013-08-30 LAB — POCT URINE PREGNANCY: Preg Test, Ur: NEGATIVE

## 2013-08-30 MED ORDER — MEDROXYPROGESTERONE ACETATE 10 MG PO TABS: 10.0000 mg | ORAL_TABLET | Freq: Three times a day (TID) | ORAL | Status: DC

## 2013-08-30 NOTE — Progress Notes (Signed)
Subjective:     [name] is a 43 y.o. female here for a routine exam.  Current complaints: Patient is in office today for heavy bleeding and blood clots. Patient states she has been bleeding since around December 31st. Patient states that for the first week the bleeding was light but has been heavy ever since. Patient states she is having to change pads around 7-8 times per day and that they are completely saturated. Patient states she has passed a lot of quarter size clots and several very large clots. Patient states that the clots are dark red. Patient states that she can be laying down and when she gets up she can feel the bleeding coming out. Patient states she has not had a cycle in 5 years.   Personal health questionnaire reviewed: yes.   Gynecologic History No LMP recorded. Patient is not currently having periods (Reason: Other). Contraception: NuvaRing vaginal inserts Last Pap: 1/2014esults were: normal Last mammogram: 2014. Results were: normal  Obstetric History OB History  Gravida Para Term Preterm AB SAB TAB Ectopic Multiple Living  2 1 1  1   1  1     # Outcome Date GA Lbr Len/2nd Weight Sex Delivery Anes PTL Lv  2 ECT 1998        N  1 TRM 01/26/91 [redacted]w[redacted]d  8 lb 9 oz (3.884 kg) M SVD EPI  Y       The following portions of the patient's history were reviewed and updated as appropriate: allergies, current medications, past family history, past medical history, past social history, past surgical history and problem list.  Review of Systems Pertinent items are noted in HPI.    Objective:      General:  alert     Abdomen: soft, non-tender; bowel sounds normal; no masses,  no organomegaly   Vulva:  normal  Vagina: Scant heme  Cervix:  no lesions  Corpus: normal size, contour, position, consistency, mobility, non-tender  Adnexa:  normal adnexa   Assessment:  AUB--?O, prolonged episode     Plan:   Orders Placed This Encounter  Procedures  . GC/Chlamydia Probe Amp   . TSH  . INR/PT  . PTT  . POCT urine pregnancy  Pelvic U/S Provera x 1 week Return after the U/S

## 2013-08-31 LAB — APTT: aPTT: 27 seconds (ref 24–37)

## 2013-08-31 LAB — PROTIME-INR
INR: 0.93
Prothrombin Time: 12.4 s (ref 11.6–15.2)

## 2013-08-31 LAB — GC/CHLAMYDIA PROBE AMP
CT Probe RNA: NEGATIVE
GC Probe RNA: NEGATIVE

## 2013-08-31 LAB — TSH: TSH: 1.842 u[IU]/mL (ref 0.350–4.500)

## 2013-09-02 ENCOUNTER — Other Ambulatory Visit: Payer: Self-pay | Admitting: *Deleted

## 2013-09-02 DIAGNOSIS — N939 Abnormal uterine and vaginal bleeding, unspecified: Secondary | ICD-10-CM

## 2013-09-03 NOTE — Patient Instructions (Signed)

## 2013-09-05 ENCOUNTER — Ambulatory Visit: Payer: Medicaid Other | Admitting: Endocrinology

## 2013-09-06 ENCOUNTER — Ambulatory Visit (HOSPITAL_COMMUNITY)
Admission: RE | Admit: 2013-09-06 | Discharge: 2013-09-06 | Disposition: A | Discharge status: Home / Self Care | Payer: Medicaid Other | Source: Ambulatory Visit | Attending: Obstetrics & Gynecology | Admitting: Obstetrics & Gynecology

## 2013-09-06 DIAGNOSIS — N925 Other specified irregular menstruation: Secondary | ICD-10-CM | POA: Insufficient documentation

## 2013-09-06 DIAGNOSIS — N939 Abnormal uterine and vaginal bleeding, unspecified: Secondary | ICD-10-CM

## 2013-09-06 DIAGNOSIS — N938 Other specified abnormal uterine and vaginal bleeding: Secondary | ICD-10-CM | POA: Insufficient documentation

## 2013-09-06 DIAGNOSIS — Q5128 Other doubling of uterus, other specified: Secondary | ICD-10-CM | POA: Insufficient documentation

## 2013-09-06 DIAGNOSIS — D649 Anemia, unspecified: Secondary | ICD-10-CM | POA: Insufficient documentation

## 2013-09-06 DIAGNOSIS — N949 Unspecified condition associated with female genital organs and menstrual cycle: Secondary | ICD-10-CM | POA: Insufficient documentation

## 2013-09-06 DIAGNOSIS — Q512 Other doubling of uterus, unspecified: Secondary | ICD-10-CM

## 2013-09-08 ENCOUNTER — Encounter: Payer: Self-pay | Admitting: Obstetrics & Gynecology

## 2013-09-08 DIAGNOSIS — Q512 Other doubling of uterus, unspecified: Secondary | ICD-10-CM

## 2013-09-08 DIAGNOSIS — Q5128 Other doubling of uterus, other specified: Secondary | ICD-10-CM | POA: Insufficient documentation

## 2013-09-10 ENCOUNTER — Encounter: Payer: Self-pay | Admitting: Endocrinology

## 2013-09-10 ENCOUNTER — Ambulatory Visit (INDEPENDENT_AMBULATORY_CARE_PROVIDER_SITE_OTHER): Payer: Medicaid Other | Admitting: Endocrinology

## 2013-09-10 VITALS — BP 124/80 | HR 96 | Temp 98.9°F | Ht 68.0 in | Wt 376.0 lb

## 2013-09-10 DIAGNOSIS — E119 Type 2 diabetes mellitus without complications: Secondary | ICD-10-CM

## 2013-09-10 DIAGNOSIS — E109 Type 1 diabetes mellitus without complications: Secondary | ICD-10-CM

## 2013-09-10 LAB — HEMOGLOBIN A1C
HEMOGLOBIN A1C: 7.2 % — AB (ref <5.7)
Mean Plasma Glucose: 160 mg/dL — ABNORMAL HIGH (ref <117)

## 2013-09-10 NOTE — Progress Notes (Signed)
Subjective:    Patient ID: [name], female    DOB: 1970-08-19, 43 y.o.   MRN: [id number]  HPI Pt returns for f/u of insulin-requiring DM (dx'ed 2011, on a routine blood test; she has mild if any neuropathy of the lower extremities; she has no known associated complications; she has never had severe hypoglycemia or DKA).  She was on a high dosage of U-500.  However, in early 2014, she lost weight, and her insulin requirement decreased.  she was restarted on the U-500 in mid-2014.   no cbg record, but states cbg's vary from 45-200.  It is in general higher as the day goes on.   Past Medical History  Diagnosis Date  . Bell's palsy 08/09/2010  . SLE 08/09/2010  . SJOGREN'S SYNDROME 08/09/2010  . Hypertension   . High cholesterol   . Anginal pain   . Obstructive sleep apnea on CPAP 2011  . Anxiety   . GERD (gastroesophageal reflux disease)   . CHF (congestive heart failure)   . Acute renal failure      Intractable nausea vomiting secondary to diabetic gastroparesis causing dehydration and acute renal failure /notes 04/01/2013  . Liver mass     biopsied 03/2013 at [facility], not malignant.  is to undergo radiologic ablation of the mass in sept/October 2014.   Marland Kitchen Pneumonia 2000's    "once" (04/01/2013)  . On home oxygen therapy     "2L 24/7" (04/01/2013)  . Diabetic gastroparesis associated with type 2 diabetes mellitus     this is presumed diagnoses, not confirmed by any studies.   . Migraines     "maybe a couple times/yr" (04/01/2013)  . Stroke 2010; 10/2012    "left side is still weak from it, never fully regained full strength; no additions from stroke 10/2012"  . Obesity     Past Surgical History  Procedure Laterality Date  . Ectopic pregnancy surgery  1999  . Cardiac catheterization  02/07/12  . Hernia repair  4332'R    umbilical  . Epidermoid cyst excision  2007     left breast  . Esophagogastroduodenoscopy N/A 02/26/2013    Procedure: ESOPHAGOGASTRODUODENOSCOPY (EGD);  Surgeon:  Irene Shipper, MD;  Location: [facility] ENDOSCOPY;  Service: Endoscopy;  Laterality: N/A;  . Breast cyst excision Left 2007    epidermoid    History   Social History  . Marital Status: Single    Spouse Name: N/A    Number of Children: N/A  . Years of Education: N/A   Occupational History  .      student   Social History Main Topics  . Smoking status: Former Smoker -- 3 years    Types: Cigarettes    Quit date: 06/01/1990  . Smokeless tobacco: Never Used  . Alcohol Use: No  . Drug Use: No  . Sexual Activity: Yes    Partners: Male    Birth Control/ Protection: Inserts     Comment: Nuva-ring   Other Topics Concern  . Not on file   Social History Narrative   Regular exercise-yes    Current Outpatient Prescriptions on File Prior to Visit  Medication Sig Dispense Refill  . albuterol (PROAIR HFA) 108 (90 BASE) MCG/ACT inhaler Inhale 2 puffs into the lungs every 6 (six) hours as needed. For shortness of breath/wheeze      . alendronate (FOSAMAX) 70 MG tablet Take 70 mg by mouth every 7 (seven) days. thursdays. Take with a full glass of water on  an empty stomach.      . ALPRAZolam (XANAX) 0.5 MG tablet Take 0.5 mg by mouth 2 (two) times daily as needed for sleep or anxiety.       Marland Kitchen aspirin EC 81 MG tablet Take 81 mg by mouth daily.      Marland Kitchen azaTHIOprine (IMURAN) 50 MG tablet Take 50 mg by mouth 2 (two) times daily.       . carvedilol (COREG) 3.125 MG tablet Take 3.125 mg by mouth 2 (two) times daily with a meal.      . cetirizine (ZYRTEC) 10 MG tablet Take 10 mg by mouth daily.      . citalopram (CELEXA) 10 MG tablet Take 1 tablet (10 mg total) by mouth every evening.  30 tablet  0  . clopidogrel (PLAVIX) 75 MG tablet Take 75 mg by mouth daily.      . cycloSPORINE (RESTASIS) 0.05 % ophthalmic emulsion Place 1 drop into both eyes 3 (three) times daily.      Marland Kitchen EASY TOUCH INSULIN SYRINGE 30G X 1/2" 1 ML MISC USE AS DIRECTED THREE TIMES A DAY  100 each  3  . EASY TOUCH INSULIN SYRINGE 31G X  5/16" 0.3 ML MISC USE AS DIRECTED THREE TIMES A DAY  100 each  3  . famotidine (PEPCID) 20 MG tablet Take 20 mg by mouth 2 (two) times daily.      Marland Kitchen gabapentin (NEURONTIN) 300 MG capsule Take 300 mg by mouth 2 (two) times daily.       . hydroxychloroquine (PLAQUENIL) 200 MG tablet Take 200 mg by mouth 2 (two) times daily.       . isosorbide-hydrALAZINE (BIDIL) 20-37.5 MG per tablet Take 1 tablet by mouth 3 (three) times daily.      . medroxyPROGESTERone (PROVERA) 10 MG tablet Take 1 tablet (10 mg total) by mouth 3 (three) times daily.  21 tablet  0  . ondansetron (ZOFRAN) 4 MG tablet Take 1 tablet (4 mg total) by mouth every 6 (six) hours as needed for nausea.  20 tablet  0  . pantoprazole (PROTONIX) 40 MG tablet Take 1 tablet (40 mg total) by mouth 2 (two) times daily.  30 tablet  0  . polyethylene glycol (MIRALAX / GLYCOLAX) packet Take 17 g by mouth 2 (two) times daily.  14 each  0  . Potassium Chloride Crys CR (KLOR-CON M20 PO) Take 20 mEq by mouth 2 (two) times daily.       . predniSONE (DELTASONE) 20 MG tablet Take 1 tablet (20 mg total) by mouth daily with breakfast. For 10days then cut down to 10mg  daily for 1 week then back to 5mg  daily  20 tablet  0  . promethazine (PHENERGAN) 25 MG tablet Take 1 tablet (25 mg total) by mouth every 6 (six) hours as needed (refractory nausea).  30 tablet  0  . torsemide (DEMADEX) 20 MG tablet Take 1 tablet (20 mg total) by mouth 2 (two) times daily.  30 tablet  0  . traMADol (ULTRAM) 50 MG tablet Take 1 tablet (50 mg total) by mouth 3 (three) times daily as needed.  30 tablet  0  . traZODone (DESYREL) 50 MG tablet Take 1 tablet (50 mg total) by mouth at bedtime.  30 tablet  0   No current facility-administered medications on file prior to visit.    Allergies  Allergen Reactions  . Oxycodone-Acetaminophen Hives    FYI: no reaction to hydrocodone; tolerates dilaudid  . Codeine  Other (See Comments)    (Derm)  . Metoclopramide     Developed restless  leg, akathisia type limb movements.   . Oxycodone-Acetaminophen Other (See Comments)    Family History  Problem Relation Age of Onset  . Diabetes Mother   . Hypertension Other   . Stroke Other   . Arthritis Other    BP 124/80  Pulse 96  Temp(Src) 98.9 F (37.2 C) (Oral)  Ht 5\' 8"  (1.727 m)  Wt 376 lb (170.552 kg)  BMI 57.18 kg/m2  SpO2 98%  Review of Systems Denies LOC.  She has gained weight.      Objective:   Physical Exam VITAL SIGNS:  See vs page GENERAL: no distress  Lab Results  Component Value Date   HGBA1C 7.2* 09/10/2013      Assessment & Plan:  DM: she needs increased rx. This insulin regimen was chosen from multiple options, as it best matches her insulin to her changing requirements throughout the day.  The benefits of glycemic control must be weighed against the risks of hypoglycemia.   CHF: this severely limits exercise rx of her DM. Morbid obesity: this complicates the rx of DM

## 2013-09-10 NOTE — Patient Instructions (Addendum)
blood tests are being requested for you today.  We'll contact you with results.   check your blood sugar 2 times a day.  vary the time of day when you check, between before the 3 meals, and at bedtime.  also check if you have symptoms of your blood sugar being too high or too low.  please keep a record of the readings and bring it to your next appointment here.  please call us sooner if you are having low blood sugar episodes, or if it stays over 200.   Please come back for a follow-up appointment in 3 months.

## 2013-09-11 ENCOUNTER — Other Ambulatory Visit: Payer: Self-pay | Admitting: Obstetrics & Gynecology

## 2013-09-11 DIAGNOSIS — Z3040 Encounter for surveillance of contraceptives, unspecified: Secondary | ICD-10-CM

## 2013-09-18 ENCOUNTER — Ambulatory Visit: Payer: Medicaid Other | Admitting: Obstetrics & Gynecology

## 2013-09-26 ENCOUNTER — Ambulatory Visit: Payer: Medicaid Other | Admitting: Obstetrics & Gynecology

## 2013-10-03 ENCOUNTER — Encounter: Payer: Self-pay | Admitting: Obstetrics & Gynecology

## 2013-10-03 ENCOUNTER — Ambulatory Visit (INDEPENDENT_AMBULATORY_CARE_PROVIDER_SITE_OTHER): Payer: Medicaid Other | Admitting: Obstetrics & Gynecology

## 2013-10-03 VITALS — BP 154/89 | HR 99 | Temp 98.9°F | Ht 68.0 in | Wt 368.0 lb

## 2013-10-03 DIAGNOSIS — N926 Irregular menstruation, unspecified: Secondary | ICD-10-CM

## 2013-10-03 DIAGNOSIS — N939 Abnormal uterine and vaginal bleeding, unspecified: Secondary | ICD-10-CM

## 2013-10-03 MED ORDER — NORETHINDRONE 0.35 MG PO TABS: 1.0000 | ORAL_TABLET | Freq: Every day | ORAL | Status: DC

## 2013-10-03 NOTE — Progress Notes (Signed)
Subjective:     [name] is a 43 y.o. female here for a routine exam.  Current complaints: Patient is in the office today for a follow up visit for ultrasound results. Patient denies any concerns. Personal health questionnaire reviewed: yes.   Gynecologic History No LMP recorded. Patient is not currently having periods (Reason: Other). Contraception: NuvaRing vaginal inserts  Obstetric History OB History  Gravida Para Term Preterm AB SAB TAB Ectopic Multiple Living  2 1 1  1   1  1     # Outcome Date GA Lbr Len/2nd Weight Sex Delivery Anes PTL Lv  2 ECT 1998        N  1 TRM 01/26/91 [redacted]w[redacted]d  8 lb 9 oz (3.884 kg) M SVD EPI  Y       The following portions of the patient's history were reviewed and updated as appropriate: allergies, current medications, past family history, past medical history, past social history, past surgical history and problem list.  Review of Systems Pertinent items are noted in HPI.    Objective:   No exam  Assessment:    Prolonged bleeding episode resolved  Plan:   Progestin-only pill for contraception/cycle control Pt education materials provided for Mirena IUD/Essure procedure Return in a few months or prn

## 2013-10-09 ENCOUNTER — Telehealth: Payer: Self-pay

## 2013-10-09 NOTE — Telephone Encounter (Signed)
i reviewed chart: We increased insulin at recent ov, due to slightly elevated a1c. The pattern of cbg's pt has reported suggests she needs only mealtime insulin.

## 2013-10-09 NOTE — Telephone Encounter (Signed)
Dr. Fransico Setters office called wanting to know if pt should be put on Metformin, Insulin pump, or another type of medication. Pt is currently on Humulin R 50 units 3xday. Pt was recently seen at Dr. Fransico Setters office and her A1C was 7.4. I requested that labs be faxed for Dr to review. Labs placed on desk. Please advise,  Thanks!

## 2013-11-05 ENCOUNTER — Telehealth: Payer: Self-pay | Admitting: Genetic Counselor

## 2013-11-05 NOTE — Telephone Encounter (Signed)
S/W PATIENT AND GAVE [facility]ETIC [facility] APPT FOR 05/13 @ 9.  REFERRING DR. Myra Rude BLAND DX- SISTER HAS MUTATION IN THE [facility] [place] PACKET MAILED.

## 2013-11-10 ENCOUNTER — Encounter (HOSPITAL_COMMUNITY): Payer: Self-pay | Admitting: Emergency Medicine

## 2013-11-10 ENCOUNTER — Inpatient Hospital Stay (HOSPITAL_COMMUNITY)
Admission: EM | Admit: 2013-11-10 | Discharge: 2013-11-18 | DRG: 074 | Disposition: A | Discharge status: Home / Self Care | Payer: Medicaid Other | Attending: Internal Medicine | Admitting: Internal Medicine

## 2013-11-10 DIAGNOSIS — E7800 Pure hypercholesterolemia, unspecified: Secondary | ICD-10-CM | POA: Diagnosis present

## 2013-11-10 DIAGNOSIS — Z79899 Other long term (current) drug therapy: Secondary | ICD-10-CM

## 2013-11-10 DIAGNOSIS — K3184 Gastroparesis: Secondary | ICD-10-CM | POA: Diagnosis present

## 2013-11-10 DIAGNOSIS — K769 Liver disease, unspecified: Secondary | ICD-10-CM | POA: Diagnosis present

## 2013-11-10 DIAGNOSIS — R109 Unspecified abdominal pain: Secondary | ICD-10-CM

## 2013-11-10 DIAGNOSIS — K2100 Gastro-esophageal reflux disease with esophagitis, without bleeding: Secondary | ICD-10-CM | POA: Diagnosis present

## 2013-11-10 DIAGNOSIS — G510 Bell's palsy: Secondary | ICD-10-CM | POA: Diagnosis present

## 2013-11-10 DIAGNOSIS — R1013 Epigastric pain: Secondary | ICD-10-CM

## 2013-11-10 DIAGNOSIS — E2749 Other adrenocortical insufficiency: Secondary | ICD-10-CM | POA: Diagnosis present

## 2013-11-10 DIAGNOSIS — K219 Gastro-esophageal reflux disease without esophagitis: Secondary | ICD-10-CM | POA: Diagnosis present

## 2013-11-10 DIAGNOSIS — E1149 Type 2 diabetes mellitus with other diabetic neurological complication: Principal | ICD-10-CM | POA: Diagnosis present

## 2013-11-10 DIAGNOSIS — R112 Nausea with vomiting, unspecified: Secondary | ICD-10-CM

## 2013-11-10 DIAGNOSIS — R1115 Cyclical vomiting syndrome unrelated to migraine: Secondary | ICD-10-CM | POA: Diagnosis present

## 2013-11-10 DIAGNOSIS — I152 Hypertension secondary to endocrine disorders: Secondary | ICD-10-CM | POA: Diagnosis present

## 2013-11-10 DIAGNOSIS — E119 Type 2 diabetes mellitus without complications: Secondary | ICD-10-CM

## 2013-11-10 DIAGNOSIS — E1159 Type 2 diabetes mellitus with other circulatory complications: Secondary | ICD-10-CM | POA: Diagnosis present

## 2013-11-10 DIAGNOSIS — Z794 Long term (current) use of insulin: Secondary | ICD-10-CM

## 2013-11-10 DIAGNOSIS — Z833 Family history of diabetes mellitus: Secondary | ICD-10-CM

## 2013-11-10 DIAGNOSIS — N390 Urinary tract infection, site not specified: Secondary | ICD-10-CM | POA: Diagnosis present

## 2013-11-10 DIAGNOSIS — E876 Hypokalemia: Secondary | ICD-10-CM | POA: Diagnosis not present

## 2013-11-10 DIAGNOSIS — M3500 Sicca syndrome, unspecified: Secondary | ICD-10-CM | POA: Diagnosis present

## 2013-11-10 DIAGNOSIS — M329 Systemic lupus erythematosus, unspecified: Secondary | ICD-10-CM | POA: Diagnosis present

## 2013-11-10 DIAGNOSIS — Z6841 Body Mass Index (BMI) 40.0 and over, adult: Secondary | ICD-10-CM

## 2013-11-10 DIAGNOSIS — Z9981 Dependence on supplemental oxygen: Secondary | ICD-10-CM

## 2013-11-10 DIAGNOSIS — E1143 Type 2 diabetes mellitus with diabetic autonomic (poly)neuropathy: Secondary | ICD-10-CM

## 2013-11-10 DIAGNOSIS — I10 Essential (primary) hypertension: Secondary | ICD-10-CM | POA: Diagnosis present

## 2013-11-10 DIAGNOSIS — D649 Anemia, unspecified: Secondary | ICD-10-CM

## 2013-11-10 DIAGNOSIS — Z87891 Personal history of nicotine dependence: Secondary | ICD-10-CM

## 2013-11-10 DIAGNOSIS — Z8673 Personal history of transient ischemic attack (TIA), and cerebral infarction without residual deficits: Secondary | ICD-10-CM

## 2013-11-10 DIAGNOSIS — I5032 Chronic diastolic (congestive) heart failure: Secondary | ICD-10-CM

## 2013-11-10 DIAGNOSIS — I509 Heart failure, unspecified: Secondary | ICD-10-CM | POA: Diagnosis present

## 2013-11-10 DIAGNOSIS — Z7982 Long term (current) use of aspirin: Secondary | ICD-10-CM

## 2013-11-10 DIAGNOSIS — E109 Type 1 diabetes mellitus without complications: Secondary | ICD-10-CM

## 2013-11-10 LAB — CBC WITH DIFFERENTIAL/PLATELET
BASOS PCT: 0 % (ref 0–1)
Basophils Absolute: 0 10*3/uL (ref 0.0–0.1)
EOS ABS: 0.2 10*3/uL (ref 0.0–0.7)
Eosinophils Relative: 1 % (ref 0–5)
HCT: 35.2 % — ABNORMAL LOW (ref 36.0–46.0)
Hemoglobin: 11.2 g/dL — ABNORMAL LOW (ref 12.0–15.0)
LYMPHS ABS: 2 10*3/uL (ref 0.7–4.0)
Lymphocytes Relative: 17 % (ref 12–46)
MCH: 27.7 pg (ref 26.0–34.0)
MCHC: 31.8 g/dL (ref 30.0–36.0)
MCV: 87.1 fL (ref 78.0–100.0)
Monocytes Absolute: 0.5 10*3/uL (ref 0.1–1.0)
Monocytes Relative: 4 % (ref 3–12)
NEUTROS PCT: 78 % — AB (ref 43–77)
Neutro Abs: 9.3 10*3/uL — ABNORMAL HIGH (ref 1.7–7.7)
PLATELETS: 302 10*3/uL (ref 150–400)
RBC: 4.04 MIL/uL (ref 3.87–5.11)
RDW: 13.2 % (ref 11.5–15.5)
WBC: 12 10*3/uL — ABNORMAL HIGH (ref 4.0–10.5)

## 2013-11-10 LAB — I-STAT TROPONIN, ED: Troponin i, poc: 0 ng/mL (ref 0.00–0.08)

## 2013-11-10 MED ORDER — SODIUM CHLORIDE 0.9 % IV BOLUS (SEPSIS)
1000.0000 mL | Freq: Once | INTRAVENOUS | Status: AC
  Administered 2013-11-10: 1000 mL via INTRAVENOUS

## 2013-11-10 MED ORDER — ONDANSETRON HCL 4 MG/2ML IJ SOLN
4.0000 mg | Freq: Once | INTRAMUSCULAR | Status: AC
  Administered 2013-11-10: 4 mg via INTRAVENOUS
  Filled 2013-11-10: qty 2

## 2013-11-10 MED ORDER — DICYCLOMINE HCL 10 MG/ML IM SOLN
20.0000 mg | Freq: Once | INTRAMUSCULAR | Status: AC
  Administered 2013-11-11: 20 mg via INTRAMUSCULAR
  Filled 2013-11-10: qty 2

## 2013-11-10 MED ORDER — PROMETHAZINE HCL 25 MG/ML IJ SOLN
25.0000 mg | Freq: Once | INTRAMUSCULAR | Status: AC
  Administered 2013-11-10: 25 mg via INTRAVENOUS
  Filled 2013-11-10: qty 1

## 2013-11-10 MED ORDER — HYDROMORPHONE HCL PF 1 MG/ML IJ SOLN
0.5000 mg | Freq: Once | INTRAMUSCULAR | Status: AC
  Administered 2013-11-10: 0.5 mg via INTRAVENOUS
  Filled 2013-11-10: qty 1

## 2013-11-10 NOTE — ED Notes (Signed)
Pt arrives via EMS from home. C/o generalized abd pain/nausea/vomitting all day. Hx CHF/ DM. Normally on 2L [place]. 4mg  zofran given by EMS. 18 LAC. 156/98 HR 100 ST. CBG 118.

## 2013-11-10 NOTE — ED Notes (Signed)
Last emesis here, last ate this am (breakfast, vomited), last BM this am (normal). C/o diffuse abd pain and nv. Also "sweating, but I'm cold", chest was hurting, dizzy & light headed. Pt sighing, moaning and crying. VSS/WNL.

## 2013-11-10 NOTE — ED Notes (Signed)
Blood drawn and sent by RN

## 2013-11-10 NOTE — ED Notes (Signed)
DR. Sharol Given at [facility].

## 2013-11-11 ENCOUNTER — Encounter (HOSPITAL_COMMUNITY): Payer: Self-pay | Admitting: *Deleted

## 2013-11-11 ENCOUNTER — Observation Stay (HOSPITAL_COMMUNITY): Payer: Medicaid Other

## 2013-11-11 DIAGNOSIS — E109 Type 1 diabetes mellitus without complications: Secondary | ICD-10-CM

## 2013-11-11 DIAGNOSIS — R109 Unspecified abdominal pain: Secondary | ICD-10-CM

## 2013-11-11 DIAGNOSIS — I5032 Chronic diastolic (congestive) heart failure: Secondary | ICD-10-CM

## 2013-11-11 DIAGNOSIS — K2100 Gastro-esophageal reflux disease with esophagitis, without bleeding: Secondary | ICD-10-CM

## 2013-11-11 DIAGNOSIS — R112 Nausea with vomiting, unspecified: Secondary | ICD-10-CM | POA: Diagnosis present

## 2013-11-11 DIAGNOSIS — R1115 Cyclical vomiting syndrome unrelated to migraine: Secondary | ICD-10-CM

## 2013-11-11 DIAGNOSIS — N390 Urinary tract infection, site not specified: Secondary | ICD-10-CM

## 2013-11-11 LAB — COMPREHENSIVE METABOLIC PANEL
ALBUMIN: 3 g/dL — AB (ref 3.5–5.2)
ALK PHOS: 82 U/L (ref 39–117)
ALK PHOS: 79 U/L (ref 39–117)
ALT: 6 U/L (ref 0–35)
ALT: 6 U/L (ref 0–35)
AST: 14 U/L (ref 0–37)
AST: 13 U/L (ref 0–37)
Albumin: 2.9 g/dL — ABNORMAL LOW (ref 3.5–5.2)
BUN: 10 mg/dL (ref 6–23)
BUN: 12 mg/dL (ref 6–23)
CHLORIDE: 106 meq/L (ref 96–112)
CO2: 23 mEq/L (ref 19–32)
CO2: 25 mEq/L (ref 19–32)
Calcium: 9.5 mg/dL (ref 8.4–10.5)
Calcium: 8.9 mg/dL (ref 8.4–10.5)
Chloride: 105 mEq/L (ref 96–112)
Creatinine, Ser: 0.93 mg/dL (ref 0.50–1.10)
Creatinine, Ser: 1 mg/dL (ref 0.50–1.10)
GFR calc Af Amer: 86 mL/min — ABNORMAL LOW (ref >90)
GFR calc Af Amer: 79 mL/min — ABNORMAL LOW (ref >90)
GFR calc non Af Amer: 74 mL/min — ABNORMAL LOW (ref >90)
GFR calc non Af Amer: 68 mL/min — ABNORMAL LOW (ref >90)
Glucose, Bld: 89 mg/dL (ref 70–99)
Glucose, Bld: 104 mg/dL — ABNORMAL HIGH (ref 70–99)
POTASSIUM: 3.9 meq/L (ref 3.7–5.3)
POTASSIUM: 4 meq/L (ref 3.7–5.3)
SODIUM: 143 meq/L (ref 137–147)
Sodium: 146 mEq/L (ref 137–147)
Total Bilirubin: 0.4 mg/dL (ref 0.3–1.2)
Total Bilirubin: 0.3 mg/dL (ref 0.3–1.2)
Total Protein: 7.3 g/dL (ref 6.0–8.3)
Total Protein: 6.9 g/dL (ref 6.0–8.3)

## 2013-11-11 LAB — GLUCOSE, CAPILLARY
GLUCOSE-CAPILLARY: 114 mg/dL — AB (ref 70–99)
GLUCOSE-CAPILLARY: 116 mg/dL — AB (ref 70–99)
GLUCOSE-CAPILLARY: 119 mg/dL — AB (ref 70–99)
Glucose-Capillary: 99 mg/dL (ref 70–99)
Glucose-Capillary: 107 mg/dL — ABNORMAL HIGH (ref 70–99)

## 2013-11-11 LAB — URINALYSIS, ROUTINE W REFLEX MICROSCOPIC
GLUCOSE, UA: NEGATIVE mg/dL
Ketones, ur: 40 mg/dL — AB
Nitrite: POSITIVE — AB
Protein, ur: >300 mg/dL — AB
Specific Gravity, Urine: 1.04 — ABNORMAL HIGH (ref 1.005–1.030)
Urobilinogen, UA: 1 mg/dL (ref 0.0–1.0)
pH: 5.5 (ref 5.0–8.0)

## 2013-11-11 LAB — CBC
HCT: 32.8 % — ABNORMAL LOW (ref 36.0–46.0)
Hemoglobin: 10.4 g/dL — ABNORMAL LOW (ref 12.0–15.0)
MCH: 27.9 pg (ref 26.0–34.0)
MCHC: 31.7 g/dL (ref 30.0–36.0)
MCV: 87.9 fL (ref 78.0–100.0)
PLATELETS: 287 10*3/uL (ref 150–400)
RBC: 3.73 MIL/uL — ABNORMAL LOW (ref 3.87–5.11)
RDW: 13.4 % (ref 11.5–15.5)
WBC: 8.1 10*3/uL (ref 4.0–10.5)

## 2013-11-11 LAB — URINE MICROSCOPIC-ADD ON

## 2013-11-11 LAB — CBG MONITORING, ED: GLUCOSE-CAPILLARY: 92 mg/dL (ref 70–99)

## 2013-11-11 LAB — POCT PREGNANCY, URINE: Preg Test, Ur: NEGATIVE

## 2013-11-11 LAB — LIPASE, BLOOD: Lipase: 23 U/L (ref 11–59)

## 2013-11-11 LAB — PROTIME-INR
INR: 1.01 (ref 0.00–1.49)
Prothrombin Time: 13.1 seconds (ref 11.6–15.2)

## 2013-11-11 MED ORDER — HYDROMORPHONE HCL PF 1 MG/ML IJ SOLN
0.5000 mg | INTRAMUSCULAR | Status: DC | PRN
  Administered 2013-11-11 – 2013-11-16 (×24): 1 mg via INTRAVENOUS
  Filled 2013-11-11 (×24): qty 1

## 2013-11-11 MED ORDER — ASPIRIN 81 MG PO CHEW
81.0000 mg | CHEWABLE_TABLET | Freq: Every day | ORAL | Status: DC
  Administered 2013-11-11 – 2013-11-18 (×8): 81 mg via ORAL
  Filled 2013-11-11 (×8): qty 1

## 2013-11-11 MED ORDER — ONDANSETRON 8 MG/NS 50 ML IVPB
8.0000 mg | Freq: Four times a day (QID) | INTRAVENOUS | Status: DC | PRN
  Administered 2013-11-11 – 2013-11-13 (×3): 8 mg via INTRAVENOUS
  Filled 2013-11-11 (×3): qty 8

## 2013-11-11 MED ORDER — CLOPIDOGREL BISULFATE 75 MG PO TABS: 75.0000 mg | ORAL_TABLET | Freq: Every day | ORAL | Status: DC

## 2013-11-11 MED ORDER — ONDANSETRON HCL 4 MG/2ML IJ SOLN
4.0000 mg | Freq: Once | INTRAMUSCULAR | Status: AC
  Administered 2013-11-11: 4 mg via INTRAVENOUS
  Filled 2013-11-11: qty 2

## 2013-11-11 MED ORDER — SODIUM CHLORIDE 0.9 % IV SOLN
500.0000 mg | Freq: Three times a day (TID) | INTRAVENOUS | Status: DC
  Administered 2013-11-11 – 2013-11-18 (×22): 500 mg via INTRAVENOUS
  Filled 2013-11-11 (×27): qty 10

## 2013-11-11 MED ORDER — DEXTROSE 5 % IV SOLN
1.0000 g | INTRAVENOUS | Status: DC
  Administered 2013-11-11 – 2013-11-15 (×5): 1 g via INTRAVENOUS
  Filled 2013-11-11 (×5): qty 10

## 2013-11-11 MED ORDER — ASPIRIN EC 81 MG PO TBEC: 81.0000 mg | DELAYED_RELEASE_TABLET | Freq: Every day | ORAL | Status: DC

## 2013-11-11 MED ORDER — PANTOPRAZOLE SODIUM 40 MG IV SOLR
40.0000 mg | Freq: Two times a day (BID) | INTRAVENOUS | Status: DC
  Administered 2013-11-11 – 2013-11-12 (×3): 40 mg via INTRAVENOUS
  Filled 2013-11-11 (×4): qty 40

## 2013-11-11 MED ORDER — AZATHIOPRINE 50 MG PO TABS
50.0000 mg | ORAL_TABLET | Freq: Two times a day (BID) | ORAL | Status: DC
  Administered 2013-11-11 – 2013-11-18 (×15): 50 mg via ORAL
  Filled 2013-11-11 (×16): qty 1

## 2013-11-11 MED ORDER — ONDANSETRON HCL 4 MG/2ML IJ SOLN
4.0000 mg | Freq: Three times a day (TID) | INTRAMUSCULAR | Status: DC
  Administered 2013-11-11 – 2013-11-12 (×3): 4 mg via INTRAVENOUS
  Filled 2013-11-11 (×3): qty 2

## 2013-11-11 MED ORDER — PROCHLORPERAZINE EDISYLATE 5 MG/ML IJ SOLN
10.0000 mg | Freq: Four times a day (QID) | INTRAMUSCULAR | Status: DC | PRN
  Administered 2013-11-11 – 2013-11-17 (×11): 10 mg via INTRAVENOUS
  Filled 2013-11-11 (×13): qty 2

## 2013-11-11 MED ORDER — ENOXAPARIN SODIUM 100 MG/ML ~~LOC~~ SOLN
90.0000 mg | SUBCUTANEOUS | Status: DC
  Administered 2013-11-12 – 2013-11-18 (×7): 90 mg via SUBCUTANEOUS
  Filled 2013-11-11 (×8): qty 1

## 2013-11-11 MED ORDER — SODIUM CHLORIDE 0.9 % IV SOLN
INTRAVENOUS | Status: DC
  Administered 2013-11-11: 05:00:00 via INTRAVENOUS
  Administered 2013-11-11 (×2): 125 mL/h via INTRAVENOUS
  Administered 2013-11-12: 1000 mL via INTRAVENOUS
  Administered 2013-11-12 – 2013-11-14 (×5): via INTRAVENOUS

## 2013-11-11 MED ORDER — HYDROMORPHONE HCL PF 1 MG/ML IJ SOLN
0.5000 mg | Freq: Once | INTRAMUSCULAR | Status: AC
  Administered 2013-11-11: 0.5 mg via INTRAVENOUS
  Filled 2013-11-11: qty 1

## 2013-11-11 MED ORDER — ONDANSETRON HCL 4 MG PO TABS
4.0000 mg | ORAL_TABLET | Freq: Four times a day (QID) | ORAL | Status: DC | PRN
  Administered 2013-11-13: 4 mg via ORAL
  Filled 2013-11-11: qty 1

## 2013-11-11 MED ORDER — HYDROMORPHONE HCL PF 1 MG/ML IJ SOLN
1.0000 mg | Freq: Once | INTRAMUSCULAR | Status: AC
  Administered 2013-11-11: 1 mg via INTRAVENOUS
  Filled 2013-11-11: qty 1

## 2013-11-11 MED ORDER — INSULIN ASPART 100 UNIT/ML ~~LOC~~ SOLN
0.0000 [IU] | Freq: Four times a day (QID) | SUBCUTANEOUS | Status: DC
  Administered 2013-11-12: 2 [IU] via SUBCUTANEOUS

## 2013-11-11 MED ORDER — BIOTENE DRY MOUTH MT LIQD
15.0000 mL | Freq: Two times a day (BID) | OROMUCOSAL | Status: DC
  Administered 2013-11-11 – 2013-11-18 (×13): 15 mL via OROMUCOSAL

## 2013-11-11 MED ORDER — ENOXAPARIN SODIUM 40 MG/0.4ML ~~LOC~~ SOLN
40.0000 mg | SUBCUTANEOUS | Status: DC
  Administered 2013-11-11: 40 mg via SUBCUTANEOUS
  Filled 2013-11-11: qty 0.4

## 2013-11-11 MED ORDER — HYDROCORTISONE NA SUCCINATE PF 100 MG IJ SOLR
50.0000 mg | Freq: Every day | INTRAMUSCULAR | Status: DC
  Administered 2013-11-11 – 2013-11-14 (×4): 50 mg via INTRAVENOUS
  Filled 2013-11-11 (×4): qty 1

## 2013-11-11 MED ORDER — HYDROCORTISONE NA SUCCINATE PF 100 MG IJ SOLR: 50.0000 mg | Freq: Three times a day (TID) | INTRAMUSCULAR | Status: DC

## 2013-11-11 MED ORDER — ASPIRIN 300 MG RE SUPP
300.0000 mg | Freq: Every day | RECTAL | Status: DC
  Filled 2013-11-11: qty 1

## 2013-11-11 NOTE — ED Notes (Signed)
Pt is actively vomiting

## 2013-11-11 NOTE — ED Notes (Signed)
Alert, NAD, calm, interactive, resps e/u, speaking in clear complete sentences, "pain has gone back up", rates 9/10, denies nausea at this time. VSS.

## 2013-11-11 NOTE — H&P (Signed)
[facility] History and Physical  Patient: [name]  OVF:[id number]  DOB: 1971-04-02  DOS: the patient was seen and examined on 11/11/2013 PCP: Theressa Millard, MD  Chief Complaint: Nausea vomiting  HPI: [name] is a 43 y.o. female with Past medical history of Sjgren syndrome, diabetes mellitus, clinically diagnosed gastroparesis, hepatic coma status post embolization, GERD with reflux esophagitis on EGD, hypertension, morbid obesity. The patient presented with complaints of nausea vomiting ongoing since last one day. She will go to the recurrent nausea and had multiple episodes of vomiting which is nonbloody and bilious. She also has chronic upper abdominal pain which she describes as progressively worsening. She denies any fever or chills. She denies any diarrhea. Last bowel movement was yesterday. She is not passing gas. She does this is like her prior episodes of intractable nausea and vomiting. She is unable to keep her medication down which was her biggest concern. She has been clinically diagnosed with gastroparesis and was treated with IV erythromycin in the hospital but as an outpatient was not continued on that. She cannot tolerate Reglan. She was recommended to followup with gastroenterology at [facility] for possible nuclear study, she did followup with surgery and rheumatology but unfortunately not with gastroenterology. Recently she changed her PCP who started her on Byetta, and she has been using that for since last one week.  The patient is coming from home. And at her baseline Independent for most of her  ADL.  Review of Systems: as mentioned in the history of present illness.  A Comprehensive review of the other systems is negative.  Past Medical History  Diagnosis Date  . Bell's palsy 08/09/2010  . SLE 08/09/2010  . SJOGREN'S SYNDROME 08/09/2010  . Hypertension   . High cholesterol   . Anginal pain   . Obstructive sleep apnea on CPAP 2011  . Anxiety   .  GERD (gastroesophageal reflux disease)   . CHF (congestive heart failure)   . Acute renal failure      Intractable nausea vomiting secondary to diabetic gastroparesis causing dehydration and acute renal failure /notes 04/01/2013  . Liver mass     biopsied 03/2013 at [facility], not malignant.  is to undergo radiologic ablation of the mass in sept/October 2014.   Marland Kitchen Pneumonia 2000's    "once" (04/01/2013)  . On home oxygen therapy     "2L 24/7" (04/01/2013)  . Diabetic gastroparesis associated with type 2 diabetes mellitus     this is presumed diagnoses, not confirmed by any studies.   . Migraines     "maybe a couple times/yr" (04/01/2013)  . Stroke 2010; 10/2012    "left side is still weak from it, never fully regained full strength; no additions from stroke 10/2012"  . Obesity    Past Surgical History  Procedure Laterality Date  . Ectopic pregnancy surgery  1999  . Cardiac catheterization  02/07/12  . Hernia repair  8416'S    umbilical  . Epidermoid cyst excision  2007     left breast  . Esophagogastroduodenoscopy N/A 02/26/2013    Procedure: ESOPHAGOGASTRODUODENOSCOPY (EGD);  Surgeon: Irene Shipper, MD;  Location: [facility] ENDOSCOPY;  Service: Endoscopy;  Laterality: N/A;  . Breast cyst excision Left 2007    epidermoid   Social History:  reports that she quit smoking about 23 years ago. Her smoking use included Cigarettes. She smoked 0.00 packs per day for 3 years. She has never used smokeless tobacco. She reports that she does not drink  alcohol or use illicit drugs.  Allergies  Allergen Reactions  . Oxycodone-Acetaminophen Hives    FYI: no reaction to hydrocodone; tolerates dilaudid  . Codeine Other (See Comments)    (Derm)  . Metoclopramide     Developed restless leg, akathisia type limb movements.   . Oxycodone-Acetaminophen Other (See Comments)    Family History  Problem Relation Age of Onset  . Diabetes Mother   . Hypertension Other   . Stroke Other   . Arthritis Other      Prior to Admission medications   Medication Sig Start Date End Date Taking? Authorizing Provider  albuterol (PROAIR HFA) 108 (90 BASE) MCG/ACT inhaler Inhale 2 puffs into the lungs every 6 (six) hours as needed. For shortness of breath/wheeze   Yes Historical Provider, MD  alendronate (FOSAMAX) 70 MG tablet Take 70 mg by mouth every 7 (seven) days. saturday. Take with a full glass of water on an empty stomach.   Yes Historical Provider, MD  ALPRAZolam Prudy Feeler) 0.5 MG tablet Take 0.5 mg by mouth 2 (two) times daily as needed for sleep or anxiety.    Yes Historical Provider, MD  aspirin EC 81 MG tablet Take 81 mg by mouth daily.   Yes Historical Provider, MD  azaTHIOprine (IMURAN) 50 MG tablet Take 100 mg by mouth 2 (two) times daily.    Yes Historical Provider, MD  carvedilol (COREG) 3.125 MG tablet Take 3.125 mg by mouth 2 (two) times daily with a meal.   Yes Historical Provider, MD  cetirizine (ZYRTEC) 10 MG tablet Take 10 mg by mouth daily.   Yes Historical Provider, MD  citalopram (CELEXA) 10 MG tablet Take 1 tablet (10 mg total) by mouth every evening. 04/06/13  Yes Belkys A Regalado, MD  clopidogrel (PLAVIX) 75 MG tablet Take 75 mg by mouth daily.   Yes Historical Provider, MD  cycloSPORINE (RESTASIS) 0.05 % ophthalmic emulsion Place 1 drop into both eyes 3 (three) times daily.   Yes Historical Provider, MD  exenatide (BYETTA) 5 MCG/0.02ML SOPN injection Inject 5 mcg into the skin 2 (two) times daily with a meal.   Yes Historical Provider, MD  famotidine (PEPCID) 20 MG tablet Take 20 mg by mouth 2 (two) times daily.   Yes Historical Provider, MD  gabapentin (NEURONTIN) 300 MG capsule Take 300 mg by mouth 2 (two) times daily.    Yes Historical Provider, MD  hydroxychloroquine (PLAQUENIL) 200 MG tablet Take 200 mg by mouth 2 (two) times daily.    Yes Historical Provider, MD  insulin regular human CONCENTRATED (HUMULIN R) 500 UNIT/ML SOLN injection Inject 50 Units into the skin 3 (three) times  daily with meals. Because it is concentrated, please draw up as though it is just 10 units at a time. 06/24/13  Yes Romero Belling, MD  isosorbide-hydrALAZINE (BIDIL) 20-37.5 MG per tablet Take 1 tablet by mouth 3 (three) times daily.   Yes Historical Provider, MD  metFORMIN (GLUCOPHAGE) 500 MG tablet Take 500 mg by mouth 2 (two) times daily with a meal.   Yes Historical Provider, MD  potassium chloride SA (K-DUR,KLOR-CON) 20 MEQ tablet Take 20 mEq by mouth 2 (two) times daily.   Yes Historical Provider, MD  predniSONE (DELTASONE) 5 MG tablet Take 5 mg by mouth daily with breakfast.   Yes Historical Provider, MD  promethazine (PHENERGAN) 25 MG tablet Take 25 mg by mouth every 6 (six) hours as needed for nausea or vomiting.   Yes Historical Provider, MD  torsemide [facility])  20 MG tablet Take 1 tablet (20 mg total) by mouth 2 (two) times daily. 04/06/13  Yes Belkys A Regalado, MD  traMADol (ULTRAM) 50 MG tablet Take 50 mg by mouth 3 (three) times daily as needed for moderate pain.    Yes Historical Provider, MD  traZODone (DESYREL) 50 MG tablet Take 1 tablet (50 mg total) by mouth at bedtime. 04/06/13  Yes Belkys A Regalado, MD  EASY TOUCH INSULIN SYRINGE 30G X 1/2" 1 ML MISC USE AS DIRECTED THREE TIMES A DAY 07/05/13   Renato Shin, MD  EASY [facility] INSULIN SYRINGE 31G X 5/16" 0.3 ML MISC USE AS DIRECTED THREE TIMES A DAY 07/05/13   Renato Shin, MD  norethindrone (MICRONOR,CAMILA,ERRIN) 0.35 MG tablet Take 1 tablet (0.35 mg total) by mouth daily. 10/03/13   Lahoma Crocker, MD    Physical Exam: Filed Vitals:   11/11/13 0315 11/11/13 0330 11/11/13 0345 11/11/13 0400  BP: 134/60 127/92 134/61 119/80  Pulse: 81 80 80 87  Temp:      TempSrc:      Resp: 10 12 12 14   Height:      Weight:      SpO2: 100% 100% 100% 100%    General: Alert, Awake and Oriented to Time, Place and Person. Appear in marked distress Eyes: PERRL ENT: Oral Mucosa clear moist. Neck: no JVD Cardiovascular: S1 and S2 Present, no  Murmur, Peripheral Pulses Present Respiratory: Bilateral Air entry equal and Decreased, Clear to Auscultation,  no Crackles,no wheezes Abdomen: Bowel Sound Present but very sluggish, Soft and diffuse mild tender Skin: no Rash Extremities: no Pedal edema, no calf tenderness Neurologic: Grossly Unremarkable.  Labs on Admission:  CBC:  Recent Labs Lab 11/10/13 2325  WBC 12.0*  NEUTROABS 9.3*  HGB 11.2*  HCT 35.2*  MCV 87.1  PLT 302    CMP     Component Value Date/Time   NA 143 11/10/2013 2325   K 3.9 11/10/2013 2325   CL 105 11/10/2013 2325   CO2 23 11/10/2013 2325   GLUCOSE 89 11/10/2013 2325   BUN 10 11/10/2013 2325   CREATININE 0.93 11/10/2013 2325   CALCIUM 9.5 11/10/2013 2325   PROT 7.3 11/10/2013 2325   ALBUMIN 3.0* 11/10/2013 2325   AST 14 11/10/2013 2325   ALT 6 11/10/2013 2325   ALKPHOS 82 11/10/2013 2325   BILITOT 0.4 11/10/2013 2325   GFRNONAA 74* 11/10/2013 2325   GFRAA 86* 11/10/2013 2325     Recent Labs Lab 11/10/13 2325  LIPASE 23   No results found for this basename: AMMONIA,  in the last 168 hours  No results found for this basename: CKTOTAL, CKMB, CKMBINDEX, TROPONINI,  in the last 168 hours BNP (last 3 results)  Recent Labs  02/25/13 1918 04/01/13 1228 04/18/13 2235  PROBNP 162.5* 29.6 231.5*    Radiological Exams on Admission: Dg Abd Acute W/chest  11/11/2013   CLINICAL DATA:  Possible gastroparesis with nausea, vomiting and abdominal pain.  EXAM: ACUTE ABDOMEN SERIES (ABDOMEN 2 VIEW & CHEST 1 VIEW)  COMPARISON:  DG CHEST 2 VIEW dated 07/15/2013; DG ABD 2 VIEWS dated 04/01/2013  FINDINGS: The cardiac silhouette appears mild to moderately enlarged, unchanged with mild central pulmonary vasculature congestion. Lungs are clear, no pleural effusions. No pneumothorax. Soft tissue planes and included osseous structures are unremarkable.  Bowel gas pattern is nondilated and nonobstructive. No intra-abdominal mass effect, pathologic calcifications or free air.  Soft tissue planes and included osseous structures are nonsuspicious. Safety pins project  in the pelvis are likely external to the patient.  IMPRESSION: Stable cardiomegaly and central pulmonary vasculature congestion.  Nonspecific bowel gas pattern.   Electronically Signed   By: Elon Alas   On: 11/11/2013 04:51     Assessment/Plan Principal Problem:   Intractable nausea and vomiting Active Problems:   SLE   SJOGREN'S SYNDROME   DM (diabetes mellitus)   HTN (hypertension)   Reflux esophagitis   Gastroparesis   1. Intractable nausea and vomiting The patient is presenting with intractable nausea and vomiting. She has clinically diagnosed gastroparesis but has not been on any treatment for that. She also has history of reflux esophagitis and was on Catapres and Pepcid but is not on any treatment at this point. She also has history of Sjgren syndrome which can cause esophageal dysmotility. With all this she will be admitted to [place] service for further workup. I would restart her on IV azithromycin 500 mg every 8 hours, continue her on IV Zofran every 8 hours as needed, IV hydration, IV pain medication, IV Protonix twice a day. I will obtain an x-ray abdomen since she is not passing gas and has very sluggish bowel sounds to make sure she does not have any evidence of structural obstruction. I would hold her oral medications that point  2. Diabetes mellitus Placing the patient on sliding scale with resistance, continuing her U 500 at a reduced rate.  DVT Prophylaxis: subcutaneous Heparin Nutrition: N.p.o. except ice chips  Code Status: Full  Disposition: Admitted to observation med-surge unit.  Author: Berle Mull, MD [facility] Pager: [phone] 11/11/2013, 4:56 AM    If 7PM-7AM, please contact night-coverage [email or web address] Password [email or web address]

## 2013-11-11 NOTE — Progress Notes (Addendum)
I have seen and assessed the patient and agree with Dr. Serita Grit assessment and plan. Urinalysis consistent with UTI. Will place empirically on IV Rocephin.

## 2013-11-11 NOTE — ED Notes (Addendum)
Admitting MD, Dr. Posey Pronto at [facility]. Report called to 5W 20

## 2013-11-11 NOTE — Progress Notes (Signed)
UR completed. Patient changed to inpatient- requiring IV antibiotics and IVF

## 2013-11-11 NOTE — ED Notes (Signed)
Dr. Sharol Given in to see pt. zofran given. IVF continues to infuse (1st liter, positional). Pt rates pain 8/10.

## 2013-11-11 NOTE — ED Provider Notes (Signed)
CSN: [id number]     Arrival date & time 11/10/13  2215 History   First MD Initiated Contact with Patient 11/10/13 2259     Chief Complaint  Patient presents with  . Abdominal Pain     (Consider location/radiation/quality/duration/timing/severity/associated sxs/prior Treatment) HPI 43 year old female presents to emergency department with complaint of several days of nausea, one day of vomiting.  Patient has history of diabetes, lupus, obesity, GERD, and gastroparesis.  She denies any fever or chills.  No diarrhea.  Patient checked her blood sugar earlier this morning.  It was 73.  She reports she's been unable to keep down any of her medications.  Patient is followed by Dr. bland and a rheumatologist at [facility]. Past Medical History  Diagnosis Date  . Bell's palsy 08/09/2010  . SLE 08/09/2010  . SJOGREN'S SYNDROME 08/09/2010  . Hypertension   . High cholesterol   . Anginal pain   . Obstructive sleep apnea on CPAP 2011  . Anxiety   . GERD (gastroesophageal reflux disease)   . CHF (congestive heart failure)   . Acute renal failure      Intractable nausea vomiting secondary to diabetic gastroparesis causing dehydration and acute renal failure /notes 04/01/2013  . Liver mass     biopsied 03/2013 at [facility], not malignant.  is to undergo radiologic ablation of the mass in sept/October 2014.   Marland Kitchen Pneumonia 2000's    "once" (04/01/2013)  . On home oxygen therapy     "2L 24/7" (04/01/2013)  . Diabetic gastroparesis associated with type 2 diabetes mellitus     this is presumed diagnoses, not confirmed by any studies.   . Migraines     "maybe a couple times/yr" (04/01/2013)  . Stroke 2010; 10/2012    "left side is still weak from it, never fully regained full strength; no additions from stroke 10/2012"  . Obesity    Past Surgical History  Procedure Laterality Date  . Ectopic pregnancy surgery  1999  . Cardiac catheterization  02/07/12  . Hernia repair  8563'J    umbilical  . Epidermoid cyst  excision  2007     left breast  . Esophagogastroduodenoscopy N/A 02/26/2013    Procedure: ESOPHAGOGASTRODUODENOSCOPY (EGD);  Surgeon: Irene Shipper, MD;  Location: [facility] ENDOSCOPY;  Service: Endoscopy;  Laterality: N/A;  . Breast cyst excision Left 2007    epidermoid   Family History  Problem Relation Age of Onset  . Diabetes Mother   . Hypertension Other   . Stroke Other   . Arthritis Other    History  Substance Use Topics  . Smoking status: Former Smoker -- 3 years    Types: Cigarettes    Quit date: 06/01/1990  . Smokeless tobacco: Never Used  . Alcohol Use: No   OB History   Grav Para Term Preterm Abortions TAB SAB Ect Mult Living   2 1 1  1   1  1      Review of Systems   See History of Present Illness; otherwise all other systems are reviewed and negative  Allergies  Oxycodone-acetaminophen; Codeine; Metoclopramide; and Oxycodone-acetaminophen  Home Medications   Current Outpatient Rx  Name  Route  Sig  Dispense  Refill  . albuterol (PROAIR HFA) 108 (90 BASE) MCG/ACT inhaler   Inhalation   Inhale 2 puffs into the lungs every 6 (six) hours as needed. For shortness of breath/wheeze         . alendronate (FOSAMAX) 70 MG tablet   Oral  Take 70 mg by mouth every 7 (seven) days. saturday. Take with a full glass of water on an empty stomach.         . ALPRAZolam (XANAX) 0.5 MG tablet   Oral   Take 0.5 mg by mouth 2 (two) times daily as needed for sleep or anxiety.          Marland Kitchen aspirin EC 81 MG tablet   Oral   Take 81 mg by mouth daily.         Marland Kitchen azaTHIOprine (IMURAN) 50 MG tablet   Oral   Take 50 mg by mouth 2 (two) times daily.          . carvedilol (COREG) 3.125 MG tablet   Oral   Take 3.125 mg by mouth 2 (two) times daily with a meal.         . cetirizine (ZYRTEC) 10 MG tablet   Oral   Take 10 mg by mouth daily.         . citalopram (CELEXA) 10 MG tablet   Oral   Take 1 tablet (10 mg total) by mouth every evening.   30 tablet   0   .  clopidogrel (PLAVIX) 75 MG tablet   Oral   Take 75 mg by mouth daily.         . cycloSPORINE (RESTASIS) 0.05 % ophthalmic emulsion   Both Eyes   Place 1 drop into both eyes 3 (three) times daily.         Marland Kitchen exenatide (BYETTA) 5 MCG/0.02ML SOPN injection   Subcutaneous   Inject 5 mcg into the skin 2 (two) times daily with a meal.         . famotidine (PEPCID) 20 MG tablet   Oral   Take 20 mg by mouth 2 (two) times daily.         Marland Kitchen gabapentin (NEURONTIN) 300 MG capsule   Oral   Take 300 mg by mouth 2 (two) times daily.          . hydroxychloroquine (PLAQUENIL) 200 MG tablet   Oral   Take 200 mg by mouth 2 (two) times daily.          . insulin regular human CONCENTRATED (HUMULIN R) 500 UNIT/ML SOLN injection   Subcutaneous   Inject 50 Units into the skin 3 (three) times daily with meals. Because it is concentrated, please draw up as though it is just 10 units at a time.         . isosorbide-hydrALAZINE (BIDIL) 20-37.5 MG per tablet   Oral   Take 1 tablet by mouth 3 (three) times daily.         . medroxyPROGESTERone (PROVERA) 10 MG tablet   Oral   Take 1 tablet (10 mg total) by mouth 3 (three) times daily.   21 tablet   0   . metFORMIN (GLUCOPHAGE) 500 MG tablet   Oral   Take 500 mg by mouth 2 (two) times daily with a meal.         . potassium chloride SA (K-DUR,KLOR-CON) 20 MEQ tablet   Oral   Take 20 mEq by mouth 2 (two) times daily.         . predniSONE (DELTASONE) 5 MG tablet   Oral   Take 5 mg by mouth daily with breakfast.         . promethazine (PHENERGAN) 25 MG tablet   Oral   Take 25 mg by mouth every 6 (  six) hours as needed for nausea or vomiting.         . torsemide (DEMADEX) 20 MG tablet   Oral   Take 1 tablet (20 mg total) by mouth 2 (two) times daily.   30 tablet   0   . traMADol (ULTRAM) 50 MG tablet   Oral   Take 50 mg by mouth 3 (three) times daily as needed for moderate pain.          . traZODone (DESYREL) 50 MG  tablet   Oral   Take 1 tablet (50 mg total) by mouth at bedtime.   30 tablet   0   . EASY TOUCH INSULIN SYRINGE 30G X 1/2" 1 ML MISC      USE AS DIRECTED THREE TIMES A DAY   100 each   3   . EASY TOUCH INSULIN SYRINGE 31G X 5/16" 0.3 ML MISC      USE AS DIRECTED THREE TIMES A DAY   100 each   3   . norethindrone (MICRONOR,CAMILA,ERRIN) 0.35 MG tablet   Oral   Take 1 tablet (0.35 mg total) by mouth daily.   1 Package   11    BP 143/72  Pulse 91  Temp(Src) 99.6 F (37.6 C) (Oral)  Resp 15  Ht 5\' 8"  (1.727 m)  Wt 368 lb (166.924 kg)  BMI 55.97 kg/m2  SpO2 99%  LMP 11/08/2013 Physical Exam  Nursing note and vitals reviewed. Constitutional: She is oriented to person, place, and time. She appears well-developed and well-nourished. She appears distressed.  Morbidly obese, uncomfortable appearing female  HENT:  Head: Normocephalic and atraumatic.  Right Ear: External ear normal.  Left Ear: External ear normal.  Nose: Nose normal.  Mouth/Throat: Oropharynx is clear and moist.  Eyes: Conjunctivae and EOM are normal. Pupils are equal, round, and reactive to light.  Neck: Normal range of motion. Neck supple. No JVD present. No tracheal deviation present. No thyromegaly present.  Cardiovascular: Normal rate, regular rhythm, normal heart sounds and intact distal pulses.  Exam reveals no gallop and no friction rub.   No murmur heard. Pulmonary/Chest: Effort normal and breath sounds normal. No stridor. No respiratory distress. She has no wheezes. She has no rales. She exhibits no tenderness.  Abdominal: Soft. Bowel sounds are normal. She exhibits no distension and no mass. There is tenderness (diffuse abdominal pain). There is no rebound and no guarding.  Musculoskeletal: Normal range of motion. She exhibits no edema and no tenderness.  Lymphadenopathy:    She has no cervical adenopathy.  Neurological: She is alert and oriented to person, place, and time. She exhibits normal  muscle tone. Coordination normal.  Skin: Skin is warm and dry. No rash noted. No erythema. No pallor.  Psychiatric: She has a normal mood and affect. Her behavior is normal. Judgment and thought content normal.    ED Course  Procedures (including critical care time) Labs Review Labs Reviewed  CBC WITH DIFFERENTIAL - Abnormal; Notable for the following:    WBC 12.0 (*)    Hemoglobin 11.2 (*)    HCT 35.2 (*)    Neutrophils Relative % 78 (*)    Neutro Abs 9.3 (*)    All other components within normal limits  COMPREHENSIVE METABOLIC PANEL - Abnormal; Notable for the following:    Albumin 3.0 (*)    GFR calc non Af Amer 74 (*)    GFR calc Af Amer 86 (*)    All other components within  normal limits  URINALYSIS, ROUTINE W REFLEX MICROSCOPIC - Abnormal; Notable for the following:    Color, Urine RED (*)    APPearance CLOUDY (*)    Specific Gravity, Urine 1.040 (*)    Hgb urine dipstick LARGE (*)    Bilirubin Urine MODERATE (*)    Ketones, ur 40 (*)    Protein, ur >300 (*)    Nitrite POSITIVE (*)    Leukocytes, UA SMALL (*)    All other components within normal limits  URINE MICROSCOPIC-ADD ON - Abnormal; Notable for the following:    Squamous Epithelial / LPF FEW (*)    Bacteria, UA MANY (*)    All other components within normal limits  LIPASE, BLOOD  I-STAT TROPOININ, ED  POC URINE PREG, ED  CBG MONITORING, ED   Imaging Review No results found.   EKG Interpretation None      MDM   Final diagnoses:  None    43 year old female multiple medical problems, who presents with generalized abdominal pain with nausea and vomiting.  Plan for labs, fluids, pain, and nausea medicine.  Pain is nonfocal, and she has history of gastroparesis.  If we're able to get her to feeling better and tolerating by mouth she needed to followup with Dr. Criss Rosales in the clinic.  This week.  2:53 AM Pt has had persistent n/v and pain despite medications.  Will d/w hospitalist for intractable  nv.    Kalman Drape, MD 11/11/13 [phone]

## 2013-11-11 NOTE — ED Notes (Signed)
Leaving ED, going to xray on way to 5W. No changes.

## 2013-11-11 NOTE — ED Notes (Signed)
CBG 92: Dr. Sharol Given aware.

## 2013-11-11 NOTE — ED Notes (Signed)
Pt vomiting. Dr. Sharol Given aware. zofran ordered.

## 2013-11-12 DIAGNOSIS — M329 Systemic lupus erythematosus, unspecified: Secondary | ICD-10-CM

## 2013-11-12 DIAGNOSIS — I10 Essential (primary) hypertension: Secondary | ICD-10-CM

## 2013-11-12 DIAGNOSIS — E119 Type 2 diabetes mellitus without complications: Secondary | ICD-10-CM

## 2013-11-12 LAB — CBC
HCT: 32.3 % — ABNORMAL LOW (ref 36.0–46.0)
Hemoglobin: 9.8 g/dL — ABNORMAL LOW (ref 12.0–15.0)
MCH: 27.1 pg (ref 26.0–34.0)
MCHC: 30.3 g/dL (ref 30.0–36.0)
MCV: 89.2 fL (ref 78.0–100.0)
PLATELETS: 269 10*3/uL (ref 150–400)
RBC: 3.62 MIL/uL — AB (ref 3.87–5.11)
RDW: 13.7 % (ref 11.5–15.5)
WBC: 8.1 10*3/uL (ref 4.0–10.5)

## 2013-11-12 LAB — BASIC METABOLIC PANEL
BUN: 11 mg/dL (ref 6–23)
CALCIUM: 8.7 mg/dL (ref 8.4–10.5)
CO2: 23 mEq/L (ref 19–32)
CREATININE: 0.89 mg/dL (ref 0.50–1.10)
Chloride: 109 mEq/L (ref 96–112)
GFR calc Af Amer: >90 mL/min (ref >90)
GFR, EST NON AFRICAN AMERICAN: 78 mL/min — AB (ref >90)
Glucose, Bld: 134 mg/dL — ABNORMAL HIGH (ref 70–99)
Potassium: 3.9 mEq/L (ref 3.7–5.3)
SODIUM: 145 meq/L (ref 137–147)

## 2013-11-12 LAB — GLUCOSE, CAPILLARY
GLUCOSE-CAPILLARY: 117 mg/dL — AB (ref 70–99)
GLUCOSE-CAPILLARY: 131 mg/dL — AB (ref 70–99)
Glucose-Capillary: 137 mg/dL — ABNORMAL HIGH (ref 70–99)
Glucose-Capillary: 159 mg/dL — ABNORMAL HIGH (ref 70–99)

## 2013-11-12 MED ORDER — INSULIN ASPART 100 UNIT/ML ~~LOC~~ SOLN
0.0000 [IU] | Freq: Four times a day (QID) | SUBCUTANEOUS | Status: DC
  Administered 2013-11-12 – 2013-11-13 (×3): 3 [IU] via SUBCUTANEOUS
  Administered 2013-11-13: 2 [IU] via SUBCUTANEOUS
  Administered 2013-11-13: 3 [IU] via SUBCUTANEOUS
  Administered 2013-11-13 – 2013-11-14 (×3): 2 [IU] via SUBCUTANEOUS
  Administered 2013-11-14 – 2013-11-15 (×3): 3 [IU] via SUBCUTANEOUS
  Administered 2013-11-15: 2 [IU] via SUBCUTANEOUS
  Administered 2013-11-15: 3 [IU] via SUBCUTANEOUS
  Administered 2013-11-15: 2 [IU] via SUBCUTANEOUS
  Administered 2013-11-16: 3 [IU] via SUBCUTANEOUS

## 2013-11-12 MED ORDER — ONDANSETRON HCL 4 MG/2ML IJ SOLN: 8.0000 mg | Freq: Three times a day (TID) | INTRAMUSCULAR | Status: DC

## 2013-11-12 MED ORDER — PANTOPRAZOLE SODIUM 40 MG PO TBEC
40.0000 mg | DELAYED_RELEASE_TABLET | Freq: Two times a day (BID) | ORAL | Status: DC
  Administered 2013-11-12 – 2013-11-18 (×12): 40 mg via ORAL
  Filled 2013-11-12 (×12): qty 1

## 2013-11-12 MED ORDER — ONDANSETRON 8 MG/NS 50 ML IVPB
8.0000 mg | Freq: Three times a day (TID) | INTRAVENOUS | Status: DC
  Administered 2013-11-12 – 2013-11-18 (×16): 8 mg via INTRAVENOUS
  Filled 2013-11-12 (×25): qty 8

## 2013-11-12 NOTE — Progress Notes (Signed)
[facility] PROGRESS NOTE  [name] ZOX:[id number] DOB: 09-Dec-1970 DOA: 11/10/2013 PCP: Theressa Millard, MD  Assessment/Plan: #1 intractable nausea and vomiting Questionable etiology. Felt to be secondary to clinical gastroparesis. Patient currently on IV erythromycin however still nauseous. Will place patient on scheduled Zofran 8 mg 3 times daily. Clear liquids. IV fluids. Anti-medics. If no significant improvement may consider GI evaluation.  #2 urinary tract infection Urine cultures are pending. Continue IV Rocephin.  #3 diabetes mellitus Sliding scale insulin.  #4 history of lupus/Sjogren's syndrome Stable. Continue Imuran and Solu-Cortef.  #5 prophylaxis PPI for GI prophylaxis. Lovenox for DVT prophylaxis.  Code Status: full Family Communication: updated patient no family at bedside. Disposition Plan: home when medically stable.   Consultants:  none  Procedures:  Acute abdominal series 11/11/2013  Antibiotics:  IV Rocephin 11/11/2013  On IV erythromycin 11/11/13  HPI/Subjective: Patient still with complaints of nausea. Patient stated had a small bowel movement yesterday.  Objective: Filed Vitals:   11/12/13 0510  BP: 144/83  Pulse: 82  Temp: 98.5 F (36.9 C)  Resp: 18    Intake/Output Summary (Last 24 hours) at 11/12/13 1131 Last data filed at 11/12/13 8119  Gross per 24 hour  Intake 3204.99 ml  Output      1 ml  Net 3203.99 ml   Filed Weights   11/10/13 2219 11/11/13 0457  Weight: 166.924 kg (368 lb) 176.676 kg (389 lb 8 oz)    Exam:   General:  nad  Cardiovascular: rrr  Respiratory: ctab  Abdomen: soft, nontender, nondistended, positive bowel sounds.  Musculoskeletal: no clubbing cyanosis or edema  Data Reviewed: Basic Metabolic Panel:  Recent Labs Lab 11/10/13 2325 11/11/13 0652 11/12/13 0650  NA 143 146 145  K 3.9 4.0 3.9  CL 105 106 109  CO2 23 25 23   GLUCOSE 89 104* 134*  BUN 10 12 11   CREATININE 0.93  1.00 0.89  CALCIUM 9.5 8.9 8.7   Liver Function Tests:  Recent Labs Lab 11/10/13 2325 11/11/13 0652  AST 14 13  ALT 6 6  ALKPHOS 82 79  BILITOT 0.4 0.3  PROT 7.3 6.9  ALBUMIN 3.0* 2.9*    Recent Labs Lab 11/10/13 2325  LIPASE 23   No results found for this basename: AMMONIA,  in the last 168 hours CBC:  Recent Labs Lab 11/10/13 2325 11/11/13 0652 11/12/13 0650  WBC 12.0* 8.1 8.1  NEUTROABS 9.3*  --   --   HGB 11.2* 10.4* 9.8*  HCT 35.2* 32.8* 32.3*  MCV 87.1 87.9 89.2  PLT 302 287 269   Cardiac Enzymes: No results found for this basename: CKTOTAL, CKMB, CKMBINDEX, TROPONINI,  in the last 168 hours BNP (last 3 results)  Recent Labs  02/25/13 1918 04/01/13 1228 04/18/13 2235  PROBNP 162.5* 29.6 231.5*   CBG:  Recent Labs Lab 11/11/13 1722 11/11/13 2255 11/12/13 0112 11/12/13 0625 11/12/13 1038  GLUCAP 119* 107* 117* 131* 137*    No results found for this or any previous visit (from the past 240 hour(s)).   Studies: Dg Abd Acute W/chest  11/11/2013   CLINICAL DATA:  Possible gastroparesis with nausea, vomiting and abdominal pain.  EXAM: ACUTE ABDOMEN SERIES (ABDOMEN 2 VIEW & CHEST 1 VIEW)  COMPARISON:  DG CHEST 2 VIEW dated 07/15/2013; DG ABD 2 VIEWS dated 04/01/2013  FINDINGS: The cardiac silhouette appears mild to moderately enlarged, unchanged with mild central pulmonary vasculature congestion. Lungs are clear, no pleural effusions. No pneumothorax. Soft tissue planes and included  osseous structures are unremarkable.  Bowel gas pattern is nondilated and nonobstructive. No intra-abdominal mass effect, pathologic calcifications or free air. Soft tissue planes and included osseous structures are nonsuspicious. Safety pins project in the pelvis are likely external to the patient.  IMPRESSION: Stable cardiomegaly and central pulmonary vasculature congestion.  Nonspecific bowel gas pattern.   Electronically Signed   By: Elon Alas   On: 11/11/2013 04:51     Scheduled Meds: . antiseptic oral rinse  15 mL Mouth Rinse BID  . aspirin  81 mg Oral Daily  . azaTHIOprine  50 mg Oral BID  . cefTRIAXone (ROCEPHIN)  IV  1 g Intravenous Q24H  . enoxaparin (LOVENOX) injection  90 mg Subcutaneous Q24H  . erythromycin  500 mg Intravenous 3 times per day  . hydrocortisone sod succinate (SOLU-CORTEF) inj  50 mg Intravenous Daily  . insulin aspart  0-15 Units Subcutaneous Q6H  . ondansetron  8 mg Intravenous 3 times per day  . pantoprazole (PROTONIX) IV  40 mg Intravenous Q12H   Continuous Infusions: . sodium chloride 125 mL/hr at 11/12/13 0202    Principal Problem:   Intractable nausea and vomiting Active Problems:   SLE   SJOGREN'S SYNDROME   DM (diabetes mellitus)   HTN (hypertension)   Morbid obesity   UTI (urinary tract infection)   Reflux esophagitis   Gastroparesis    Time spent: [place] MD  [facility] Pager [phone]. If 7PM-7AM, please contact night-coverage at [email or web address], password [email or web address] 11/12/2013, 11:31 AM  LOS: 2 days

## 2013-11-13 ENCOUNTER — Inpatient Hospital Stay (HOSPITAL_COMMUNITY): Payer: Medicaid Other

## 2013-11-13 DIAGNOSIS — E1149 Type 2 diabetes mellitus with other diabetic neurological complication: Principal | ICD-10-CM

## 2013-11-13 DIAGNOSIS — D649 Anemia, unspecified: Secondary | ICD-10-CM

## 2013-11-13 DIAGNOSIS — K3184 Gastroparesis: Secondary | ICD-10-CM

## 2013-11-13 LAB — GLUCOSE, CAPILLARY
GLUCOSE-CAPILLARY: 147 mg/dL — AB (ref 70–99)
Glucose-Capillary: 130 mg/dL — ABNORMAL HIGH (ref 70–99)
Glucose-Capillary: 179 mg/dL — ABNORMAL HIGH (ref 70–99)
Glucose-Capillary: 182 mg/dL — ABNORMAL HIGH (ref 70–99)

## 2013-11-13 MED ORDER — IOHEXOL 300 MG/ML  SOLN
25.0000 mL | INTRAMUSCULAR | Status: AC
  Administered 2013-11-13 (×2): 25 mL via ORAL

## 2013-11-13 MED ORDER — IOHEXOL 300 MG/ML  SOLN
120.0000 mL | Freq: Once | INTRAMUSCULAR | Status: AC | PRN
  Administered 2013-11-13: 120 mL via INTRAVENOUS

## 2013-11-13 NOTE — Progress Notes (Signed)
[facility] PROGRESS NOTE  [name] HWE:[id number] DOB: 01-10-1971 DOA: 11/10/2013 PCP: Theressa Millard, MD  Assessment/Plan: #1 intractable nausea and vomiting Questionable etiology. Felt to be secondary to clinical gastroparesis. Patient currently on IV erythromycin however still nauseous.  on scheduled Zofran 8 mg 3 times daily. Clear liquids. IV fluids. Anti-medics.  She relates she was doing very well. She started to have nausea and vomiting again few dasy ago after byetta was started.  Her GI Dr at [facility] thought her nausea and vomiting was related to liver mass. She received radiation therapy. Her symptoms resolved after that.  I will check CT abdomen and pelvis.  She doesn't tolerates reglan.   #2 urinary tract infection Continue IV Rocephin. Re order urine culture.   #3 diabetes mellitus Sliding scale insulin.  #4 history of lupus/Sjogren's syndrome Stable. Continue Imuran and Solu-Cortef.  #5 prophylaxis PPI for GI prophylaxis. Lovenox for DVT prophylaxis.  Code Status: full Family Communication: updated patient no family at bedside. Disposition Plan: home when medically stable.   Consultants:  none  Procedures:  Acute abdominal series 11/11/2013  Antibiotics:  IV Rocephin 11/11/2013  On IV erythromycin 11/11/13  HPI/Subjective: Patient still with complaints of nausea. She was doing very well before few days ago. She received radiation for liver mass.  She has not been hospitalized since last year.   Objective: Filed Vitals:   11/13/13 1426  BP: 131/79  Pulse: 89  Temp: 97.5 F (36.4 C)  Resp: 18    Intake/Output Summary (Last 24 hours) at 11/13/13 1559 Last data filed at 11/13/13 1033  Gross per 24 hour  Intake    720 ml  Output      0 ml  Net    720 ml   Filed Weights   11/10/13 2219 11/11/13 0457  Weight: 166.924 kg (368 lb) 176.676 kg (389 lb 8 oz)    Exam:   General:  nad  Cardiovascular: rrr  Respiratory:  ctab  Abdomen: soft, nontender, nondistended, positive bowel sounds.  Musculoskeletal: no clubbing cyanosis or edema  Data Reviewed: Basic Metabolic Panel:  Recent Labs Lab 11/10/13 2325 11/11/13 0652 11/12/13 0650  NA 143 146 145  K 3.9 4.0 3.9  CL 105 106 109  CO2 23 25 23   GLUCOSE 89 104* 134*  BUN 10 12 11   CREATININE 0.93 1.00 0.89  CALCIUM 9.5 8.9 8.7   Liver Function Tests:  Recent Labs Lab 11/10/13 2325 11/11/13 0652  AST 14 13  ALT 6 6  ALKPHOS 82 79  BILITOT 0.4 0.3  PROT 7.3 6.9  ALBUMIN 3.0* 2.9*    Recent Labs Lab 11/10/13 2325  LIPASE 23   No results found for this basename: AMMONIA,  in the last 168 hours CBC:  Recent Labs Lab 11/10/13 2325 11/11/13 0652 11/12/13 0650  WBC 12.0* 8.1 8.1  NEUTROABS 9.3*  --   --   HGB 11.2* 10.4* 9.8*  HCT 35.2* 32.8* 32.3*  MCV 87.1 87.9 89.2  PLT 302 287 269   Cardiac Enzymes: No results found for this basename: CKTOTAL, CKMB, CKMBINDEX, TROPONINI,  in the last 168 hours BNP (last 3 results)  Recent Labs  02/25/13 1918 04/01/13 1228 04/18/13 2235  PROBNP 162.5* 29.6 231.5*   CBG:  Recent Labs Lab 11/12/13 1038 11/12/13 1206 11/13/13 0052 11/13/13 0615 11/13/13 1232  GLUCAP 137* 159* 130* 147* 179*    No results found for this or any previous visit (from the past 240 hour(s)).   Studies:  No results found.  Scheduled Meds: . antiseptic oral rinse  15 mL Mouth Rinse BID  . aspirin  81 mg Oral Daily  . azaTHIOprine  50 mg Oral BID  . cefTRIAXone (ROCEPHIN)  IV  1 g Intravenous Q24H  . enoxaparin (LOVENOX) injection  90 mg Subcutaneous Q24H  . erythromycin  500 mg Intravenous 3 times per day  . hydrocortisone sod succinate (SOLU-CORTEF) inj  50 mg Intravenous Daily  . insulin aspart  0-15 Units Subcutaneous Q6H  . ondansetron (ZOFRAN) IV  8 mg Intravenous 3 times per day  . pantoprazole  40 mg Oral BID   Continuous Infusions: . sodium chloride 75 mL/hr at 11/13/13 1221     Principal Problem:   Intractable nausea and vomiting Active Problems:   SLE   SJOGREN'S SYNDROME   DM (diabetes mellitus)   HTN (hypertension)   Morbid obesity   UTI (urinary tract infection)   Reflux esophagitis   Gastroparesis    Time spent: [place] MD  [facility] Pager [phone]. If 7PM-7AM, please contact night-coverage at [email or web address], password [facility] 11/13/2013, 3:59 PM  LOS: 3 days

## 2013-11-13 NOTE — Discharge Instructions (Signed)
Metformin and X-ray Contrast Studies °For some X-ray exams, a contrast dye is used. Contrast dye is a type of medicine used to make the X-ray image clearer. The contrast dye is given to the patient through a vein (intravenously). If you need to have this type of X-ray exam and you take a medication called metformin, your caregiver may have you stop taking metformin before the exam.  °LACTIC ACIDOSIS °In rare cases, a serious medical condition called lactic acidosis can develop in people who take metformin and receive contrast dye. The following conditions can increase the risk of this complication:  °· Kidney failure. °· Liver problems. °· Certain types of heart problems such as: °· Heart failure. °· Heart attack. °· Heart infection. °· Heart valve problems. °· Alcohol abuse. °If left untreated, lactic acidosis can lead to coma.  °SYMPTOMS OF LACTIC ACIDOSIS °Symptoms of lactic acidosis can include: °· Rapid breathing (hyperventilation). °· Neurologic symptoms such as: °· Headaches. °· Confusion. °· Dizziness. °· Excessive sweating. °· Feeling sick to your stomach (nauseous) or throwing up (vomiting). °AFTER THE X-RAY EXAM °· Stay well-hydrated. Drink fluids as instructed by your caregiver. °· If you have a risk of developing lactic acidosis, blood tests may be done to make sure your kidney function is okay. °· Metformin is usually stopped for 48 hours after the X-ray exam. Ask your caregiver when you can start taking metformin again. °SEEK MEDICAL CARE IF:  °· You have shortness of breath or difficulty breathing. °· You develop a headache that does not go away. °· You have nausea or vomiting. °· You urinate more than normal. °· You develop a skin rash and have: °· Redness. °· Swelling. °· Itching. °Document Released: 07/06/2009 Document Revised: 10/10/2011 Document Reviewed: 07/06/2009 °Exit[employer]® Patient Information ©2014 [employer]. ° °

## 2013-11-14 DIAGNOSIS — R1013 Epigastric pain: Secondary | ICD-10-CM

## 2013-11-14 LAB — CBC
HCT: 31.5 % — ABNORMAL LOW (ref 36.0–46.0)
Hemoglobin: 9.8 g/dL — ABNORMAL LOW (ref 12.0–15.0)
MCH: 27.2 pg (ref 26.0–34.0)
MCHC: 31.1 g/dL (ref 30.0–36.0)
MCV: 87.5 fL (ref 78.0–100.0)
PLATELETS: 265 10*3/uL (ref 150–400)
RBC: 3.6 MIL/uL — AB (ref 3.87–5.11)
RDW: 13.8 % (ref 11.5–15.5)
WBC: 11.1 10*3/uL — ABNORMAL HIGH (ref 4.0–10.5)

## 2013-11-14 LAB — COMPREHENSIVE METABOLIC PANEL
ALT: 8 U/L (ref 0–35)
AST: 14 U/L (ref 0–37)
Albumin: 2.9 g/dL — ABNORMAL LOW (ref 3.5–5.2)
Alkaline Phosphatase: 72 U/L (ref 39–117)
BUN: 4 mg/dL — ABNORMAL LOW (ref 6–23)
CALCIUM: 8.9 mg/dL (ref 8.4–10.5)
CO2: 22 meq/L (ref 19–32)
CREATININE: 0.91 mg/dL (ref 0.50–1.10)
Chloride: 105 mEq/L (ref 96–112)
GFR calc non Af Amer: 76 mL/min — ABNORMAL LOW (ref >90)
GFR, EST AFRICAN AMERICAN: 88 mL/min — AB (ref >90)
Glucose, Bld: 126 mg/dL — ABNORMAL HIGH (ref 70–99)
Potassium: 3.3 mEq/L — ABNORMAL LOW (ref 3.7–5.3)
Sodium: 143 mEq/L (ref 137–147)
Total Bilirubin: 0.3 mg/dL (ref 0.3–1.2)
Total Protein: 6.6 g/dL (ref 6.0–8.3)

## 2013-11-14 LAB — GLUCOSE, CAPILLARY
GLUCOSE-CAPILLARY: 134 mg/dL — AB (ref 70–99)
Glucose-Capillary: 138 mg/dL — ABNORMAL HIGH (ref 70–99)
Glucose-Capillary: 197 mg/dL — ABNORMAL HIGH (ref 70–99)
Glucose-Capillary: 171 mg/dL — ABNORMAL HIGH (ref 70–99)

## 2013-11-14 MED ORDER — POTASSIUM CHLORIDE CRYS ER 20 MEQ PO TBCR
40.0000 meq | EXTENDED_RELEASE_TABLET | Freq: Once | ORAL | Status: AC
  Administered 2013-11-14: 40 meq via ORAL
  Filled 2013-11-14: qty 2

## 2013-11-14 MED ORDER — HYDROCORTISONE NA SUCCINATE PF 100 MG IJ SOLR
50.0000 mg | Freq: Two times a day (BID) | INTRAMUSCULAR | Status: DC
  Administered 2013-11-14 – 2013-11-16 (×4): 50 mg via INTRAVENOUS
  Filled 2013-11-14 (×6): qty 1

## 2013-11-14 NOTE — Progress Notes (Signed)
[facility] PROGRESS NOTE  [name] DVV:[id number] DOB: 07-Jun-1971 DOA: 11/10/2013 PCP: Theressa Millard, MD  Assessment/Plan: #1 intractable nausea and vomiting Questionable etiology. Felt to be secondary to clinical gastroparesis. Patient currently on IV erythromycin however still nauseous.  on scheduled Zofran 8 mg 3 times daily. Clear liquids. IV fluids. Anti-medics.  She relates she was doing very well. She started to have nausea and vomiting again few dasy ago after byetta was started.  Her GI Dr at [facility] thought her nausea and vomiting was related to liver mass. She received radiation therapy. Her symptoms resolved after that.  CT abdomen and pelvis, no acute finding, prior live mass reduce in size.  She doesn't tolerates reglan.   #2 urinary tract infection Continue IV Rocephin. Re order urine culture.   #3 diabetes mellitus Sliding scale insulin.  #4 history of lupus/Sjogren's syndrome Stable. Continue Imuran and Solu-Cortef. Increase solu cortef in case adrenal insufficiency is  causing symptoms.   #5 prophylaxis PPI for GI prophylaxis. Lovenox for DVT prophylaxis.  6-Hypokalemia; replete oral.   Code Status: full Family Communication: updated patient no family at bedside. Disposition Plan: home when medically stable.   Consultants:  none  Procedures:  Acute abdominal series 11/11/2013  Antibiotics:  IV Rocephin 11/11/2013  On IV erythromycin 11/11/13  HPI/Subjective: Vomited multiple times last night, after contrast for CT scan. Still nauseous.    Objective: Filed Vitals:   11/14/13 0551  BP: 159/93  Pulse: 87  Temp: 98.6 F (37 C)  Resp: 20    Intake/Output Summary (Last 24 hours) at 11/14/13 1301 Last data filed at 11/14/13 0500  Gross per 24 hour  Intake    720 ml  Output      6 ml  Net    714 ml   Filed Weights   11/10/13 2219 11/11/13 0457  Weight: 166.924 kg (368 lb) 176.676 kg (389 lb 8 oz)    Exam:   General:   nad  Cardiovascular: rrr  Respiratory: ctab  Abdomen: soft, nontender, nondistended, positive bowel sounds.  Musculoskeletal: no clubbing cyanosis or edema  Data Reviewed: Basic Metabolic Panel:  Recent Labs Lab 11/10/13 2325 11/11/13 0652 11/12/13 0650 11/14/13 0405  NA 143 146 145 143  K 3.9 4.0 3.9 3.3*  CL 105 106 109 105  CO2 23 25 23 22   GLUCOSE 89 104* 134* 126*  BUN 10 12 11  4*  CREATININE 0.93 1.00 0.89 0.91  CALCIUM 9.5 8.9 8.7 8.9   Liver Function Tests:  Recent Labs Lab 11/10/13 2325 11/11/13 0652 11/14/13 0405  AST 14 13 14   ALT 6 6 8   ALKPHOS 82 79 72  BILITOT 0.4 0.3 0.3  PROT 7.3 6.9 6.6  ALBUMIN 3.0* 2.9* 2.9*    Recent Labs Lab 11/10/13 2325  LIPASE 23   No results found for this basename: AMMONIA,  in the last 168 hours CBC:  Recent Labs Lab 11/10/13 2325 11/11/13 0652 11/12/13 0650 11/14/13 0405  WBC 12.0* 8.1 8.1 11.1*  NEUTROABS 9.3*  --   --   --   HGB 11.2* 10.4* 9.8* 9.8*  HCT 35.2* 32.8* 32.3* 31.5*  MCV 87.1 87.9 89.2 87.5  PLT 302 287 269 265   Cardiac Enzymes: No results found for this basename: CKTOTAL, CKMB, CKMBINDEX, TROPONINI,  in the last 168 hours BNP (last 3 results)  Recent Labs  02/25/13 1918 04/01/13 1228 04/18/13 2235  PROBNP 162.5* 29.6 231.5*   CBG:  Recent Labs Lab 11/13/13 1232 11/13/13  1816 11/14/13 0015 11/14/13 0545 11/14/13 1214  GLUCAP 179* 182* 134* 138* 197*    No results found for this or any previous visit (from the past 240 hour(s)).   Studies: Ct Abdomen Pelvis W Contrast  11/13/2013   CLINICAL DATA:  Abdominal pain, nausea.  EXAM: CT ABDOMEN AND PELVIS WITH CONTRAST  TECHNIQUE: Multidetector CT imaging of the abdomen and pelvis was performed using the standard protocol following bolus administration of intravenous contrast.  CONTRAST:  121mL OMNIPAQUE IOHEXOL 300 MG/ML  SOLN  COMPARISON:  DG ABD ACUTE W/CHEST dated 11/11/2013; US PELVIS COMPLETE dated 09/06/2013; CT  ABD/PELVIS W CM dated 04/19/2013; CT ABD/PELVIS W CM dated 03/26/2013; CT ABD/PELV WO/W dated 02/20/2013; MR ABDOMEN WO/W CM dated 02/24/2013  FINDINGS: Mild cardiomegaly. Linear densities in the left base compatible with subsegmental atelectasis. Right lung bases clear. No effusions.  Previously seen hypodense lesion within the liver has significantly improved since prior study. This currently measures approximately 2.5 x 2.2 cm on image 34 of series 2 compared with 10.6 cm previously. No new hepatic lesion. Gallbladder, spleen, pancreas, adrenals and kidneys are unremarkable.  Uterus, adnexae and urinary bladder unremarkable. Stomach, large and small bowel are grossly unremarkable. No evidence of bowel obstruction. No free fluid, free air or adenopathy. Stable soft tissue nodule/mass in the region of the umbilicus.  No acute bony abnormality or focal bone lesion.  IMPRESSION: Significant improvement in the hypodense lesion within the right hepatic lobe, currently measuring 2.5 cm compared with 10.6 cm previously.  Cardiomegaly.  No acute findings in the abdomen or pelvis.   Electronically Signed   By: Rolm Baptise M.D.   On: 11/13/2013 17:25    Scheduled Meds: . antiseptic oral rinse  15 mL Mouth Rinse BID  . aspirin  81 mg Oral Daily  . azaTHIOprine  50 mg Oral BID  . cefTRIAXone (ROCEPHIN)  IV  1 g Intravenous Q24H  . enoxaparin (LOVENOX) injection  90 mg Subcutaneous Q24H  . erythromycin  500 mg Intravenous 3 times per day  . hydrocortisone sod succinate (SOLU-CORTEF) inj  50 mg Intravenous Daily  . insulin aspart  0-15 Units Subcutaneous Q6H  . ondansetron (ZOFRAN) IV  8 mg Intravenous 3 times per day  . pantoprazole  40 mg Oral BID   Continuous Infusions: . sodium chloride 75 mL/hr at 11/14/13 0542    Principal Problem:   Intractable nausea and vomiting Active Problems:   SLE   SJOGREN'S SYNDROME   DM (diabetes mellitus)   HTN (hypertension)   Morbid obesity   UTI (urinary tract  infection)   Reflux esophagitis   Gastroparesis    Time spent: [place] MD  [facility] Pager [phone]. If 7PM-7AM, please contact night-coverage at [email or web address], password [facility] 11/14/2013, 1:01 PM  LOS: 4 days

## 2013-11-15 LAB — URINE CULTURE
CULTURE: NO GROWTH
Colony Count: NO GROWTH

## 2013-11-15 LAB — CBC
HCT: 30.6 % — ABNORMAL LOW (ref 36.0–46.0)
Hemoglobin: 9.7 g/dL — ABNORMAL LOW (ref 12.0–15.0)
MCH: 27.7 pg (ref 26.0–34.0)
MCHC: 31.7 g/dL (ref 30.0–36.0)
MCV: 87.4 fL (ref 78.0–100.0)
PLATELETS: 246 10*3/uL (ref 150–400)
RBC: 3.5 MIL/uL — ABNORMAL LOW (ref 3.87–5.11)
RDW: 14.1 % (ref 11.5–15.5)
WBC: 9.8 10*3/uL (ref 4.0–10.5)

## 2013-11-15 LAB — BASIC METABOLIC PANEL
BUN: 4 mg/dL — ABNORMAL LOW (ref 6–23)
CO2: 24 mEq/L (ref 19–32)
Calcium: 8.8 mg/dL (ref 8.4–10.5)
Chloride: 107 mEq/L (ref 96–112)
Creatinine, Ser: 0.89 mg/dL (ref 0.50–1.10)
GFR, EST NON AFRICAN AMERICAN: 78 mL/min — AB (ref >90)
Glucose, Bld: 164 mg/dL — ABNORMAL HIGH (ref 70–99)
POTASSIUM: 3.8 meq/L (ref 3.7–5.3)
Sodium: 144 mEq/L (ref 137–147)

## 2013-11-15 LAB — GLUCOSE, CAPILLARY
GLUCOSE-CAPILLARY: 168 mg/dL — AB (ref 70–99)
GLUCOSE-CAPILLARY: 178 mg/dL — AB (ref 70–99)
Glucose-Capillary: 146 mg/dL — ABNORMAL HIGH (ref 70–99)
Glucose-Capillary: 147 mg/dL — ABNORMAL HIGH (ref 70–99)
Glucose-Capillary: 174 mg/dL — ABNORMAL HIGH (ref 70–99)
Glucose-Capillary: 183 mg/dL — ABNORMAL HIGH (ref 70–99)

## 2013-11-15 MED ORDER — CARVEDILOL 3.125 MG PO TABS
3.1250 mg | ORAL_TABLET | Freq: Two times a day (BID) | ORAL | Status: DC
  Administered 2013-11-15 – 2013-11-18 (×7): 3.125 mg via ORAL
  Filled 2013-11-15 (×9): qty 1

## 2013-11-15 MED ORDER — HYDRALAZINE HCL 20 MG/ML IJ SOLN
10.0000 mg | Freq: Three times a day (TID) | INTRAMUSCULAR | Status: DC | PRN
  Administered 2013-11-15 – 2013-11-16 (×2): 10 mg via INTRAVENOUS
  Filled 2013-11-15 (×2): qty 1

## 2013-11-15 MED ORDER — ISOSORB DINITRATE-HYDRALAZINE 20-37.5 MG PO TABS
1.0000 | ORAL_TABLET | Freq: Three times a day (TID) | ORAL | Status: DC
  Administered 2013-11-15 – 2013-11-18 (×10): 1 via ORAL
  Filled 2013-11-15 (×15): qty 1

## 2013-11-15 NOTE — Progress Notes (Signed)
[facility] PROGRESS NOTE  [name] QIH:[id number] DOB: 1971-02-03 DOA: 11/10/2013 PCP: Theressa Millard, MD  Assessment/Plan: #1 intractable nausea and vomiting Questionable etiology. Felt to be secondary to clinical gastroparesis. Patient currently on IV erythromycin.  on scheduled Zofran 8 mg 3 times daily. Clear liquids. IV fluids. Anti-medics.  She relates she was doing very well. She started to have nausea and vomiting again few dasy ago after byetta was started.  Her GI Dr at [facility] thought her nausea and vomiting was related to liver mass. She received radiation therapy. Her symptoms resolved after that.  CT abdomen and pelvis, no acute finding, prior live mass reduce in size.  She doesn't tolerates reglan.  Feels some improvement. Will advance diet to full liquid.  Solu-cortef increase to BID in case symptoms are related to  adrenal insufficiency.   #2 urinary tract infection Received 5 days of ceftriaxone.  urine culture no growth.   #3 diabetes mellitus Sliding scale insulin.  #4 history of lupus/Sjogren's syndrome Stable. Continue Imuran and Solu-Cortef. Increase solu cortef in case adrenal insufficiency is  causing symptoms.   #5 prophylaxis PPI for GI prophylaxis. Lovenox for DVT prophylaxis.  6-Hypokalemia; resolved.  7-Hypertension; resume BP medications.    Code Status: full Family Communication: updated patient no family at bedside. Disposition Plan: home when medically stable.   Consultants:  none  Procedures:  Acute abdominal series 11/11/2013  Antibiotics:  IV Rocephin 11/11/2013  On IV erythromycin 11/11/13  HPI/Subjective: Vomited multiple times last night, after contrast for CT scan. Still nauseous.    Objective: Filed Vitals:   11/15/13 0926  BP: 160/84  Pulse: 78  Temp:   Resp:     Intake/Output Summary (Last 24 hours) at 11/15/13 1152 Last data filed at 11/15/13 1112  Gross per 24 hour  Intake    222 ml  Output       1 ml  Net    221 ml   Filed Weights   11/10/13 2219 11/11/13 0457  Weight: 166.924 kg (368 lb) 176.676 kg (389 lb 8 oz)    Exam:   General:  nad  Cardiovascular: rrr  Respiratory: ctab  Abdomen: soft, nontender, nondistended, positive bowel sounds.  Musculoskeletal: no clubbing cyanosis or edema  Data Reviewed: Basic Metabolic Panel:  Recent Labs Lab 11/10/13 2325 11/11/13 0652 11/12/13 0650 11/14/13 0405 11/15/13 0635  NA 143 146 145 143 144  K 3.9 4.0 3.9 3.3* 3.8  CL 105 106 109 105 107  CO2 23 25 23 22 24   GLUCOSE 89 104* 134* 126* 164*  BUN 10 12 11  4* 4*  CREATININE 0.93 1.00 0.89 0.91 0.89  CALCIUM 9.5 8.9 8.7 8.9 8.8   Liver Function Tests:  Recent Labs Lab 11/10/13 2325 11/11/13 0652 11/14/13 0405  AST 14 13 14   ALT 6 6 8   ALKPHOS 82 79 72  BILITOT 0.4 0.3 0.3  PROT 7.3 6.9 6.6  ALBUMIN 3.0* 2.9* 2.9*    Recent Labs Lab 11/10/13 2325  LIPASE 23   No results found for this basename: AMMONIA,  in the last 168 hours CBC:  Recent Labs Lab 11/10/13 2325 11/11/13 0652 11/12/13 0650 11/14/13 0405 11/15/13 0635  WBC 12.0* 8.1 8.1 11.1* 9.8  NEUTROABS 9.3*  --   --   --   --   HGB 11.2* 10.4* 9.8* 9.8* 9.7*  HCT 35.2* 32.8* 32.3* 31.5* 30.6*  MCV 87.1 87.9 89.2 87.5 87.4  PLT 302 287 269 265 246   Cardiac  Enzymes: No results found for this basename: CKTOTAL, CKMB, CKMBINDEX, TROPONINI,  in the last 168 hours BNP (last 3 results)  Recent Labs  02/25/13 1918 04/01/13 1228 04/18/13 2235  PROBNP 162.5* 29.6 231.5*   CBG:  Recent Labs Lab 11/14/13 1214 11/14/13 1753 11/15/13 0005 11/15/13 0559 11/15/13 0734  GLUCAP 197* 171* 147* 168* 146*    Recent Results (from the past 240 hour(s))  URINE CULTURE     Status: None   Collection Time    11/13/13  7:36 PM      Result Value Ref Range Status   Specimen Description URINE, CLEAN CATCH   Final   Special Requests NONE   Final   Culture  Setup Time     Final   Value:  11/13/2013 20:59     Performed at [place]     Final   Value: NO GROWTH     Performed at [facility]   Culture     Final   Value: NO GROWTH     Performed at [facility]   Report Status 11/15/2013 FINAL   Final  URINE CULTURE     Status: None   Collection Time    11/14/13  3:36 PM      Result Value Ref Range Status   Specimen Description URINE, CLEAN CATCH   Final   Special Requests NONE   Final   Culture  Setup Time     Final   Value: 11/14/2013 19:01     Performed at [place]     Final   Value: 25,000 COLONIES/ML     Performed at [facility]   Culture     Final   Value: Multiple bacterial morphotypes present, none predominant. Suggest appropriate recollection if clinically indicated.     Performed at [facility]   Report Status 11/15/2013 FINAL   Final     Studies: Ct Abdomen Pelvis W Contrast  11/13/2013   CLINICAL DATA:  Abdominal pain, nausea.  EXAM: CT ABDOMEN AND PELVIS WITH CONTRAST  TECHNIQUE: Multidetector CT imaging of the abdomen and pelvis was performed using the standard protocol following bolus administration of intravenous contrast.  CONTRAST:  147mL OMNIPAQUE IOHEXOL 300 MG/ML  SOLN  COMPARISON:  DG ABD ACUTE W/CHEST dated 11/11/2013; US PELVIS COMPLETE dated 09/06/2013; CT ABD/PELVIS W CM dated 04/19/2013; CT ABD/PELVIS W CM dated 03/26/2013; CT ABD/PELV WO/W dated 02/20/2013; MR ABDOMEN WO/W CM dated 02/24/2013  FINDINGS: Mild cardiomegaly. Linear densities in the left base compatible with subsegmental atelectasis. Right lung bases clear. No effusions.  Previously seen hypodense lesion within the liver has significantly improved since prior study. This currently measures approximately 2.5 x 2.2 cm on image 34 of series 2 compared with 10.6 cm previously. No new hepatic lesion. Gallbladder, spleen, pancreas, adrenals and kidneys are unremarkable.  Uterus, adnexae and urinary bladder  unremarkable. Stomach, large and small bowel are grossly unremarkable. No evidence of bowel obstruction. No free fluid, free air or adenopathy. Stable soft tissue nodule/mass in the region of the umbilicus.  No acute bony abnormality or focal bone lesion.  IMPRESSION: Significant improvement in the hypodense lesion within the right hepatic lobe, currently measuring 2.5 cm compared with 10.6 cm previously.  Cardiomegaly.  No acute findings in the abdomen or pelvis.   Electronically Signed   By: Rolm Baptise M.D.   On: 11/13/2013 17:25    Scheduled Meds: . antiseptic  oral rinse  15 mL Mouth Rinse BID  . aspirin  81 mg Oral Daily  . azaTHIOprine  50 mg Oral BID  . carvedilol  3.125 mg Oral BID WC  . enoxaparin (LOVENOX) injection  90 mg Subcutaneous Q24H  . erythromycin  500 mg Intravenous 3 times per day  . hydrocortisone sod succinate (SOLU-CORTEF) inj  50 mg Intravenous Q12H  . insulin aspart  0-15 Units Subcutaneous Q6H  . isosorbide-hydrALAZINE  1 tablet Oral TID  . ondansetron (ZOFRAN) IV  8 mg Intravenous 3 times per day  . pantoprazole  40 mg Oral BID   Continuous Infusions:    Principal Problem:   Intractable nausea and vomiting Active Problems:   SLE   SJOGREN'S SYNDROME   DM (diabetes mellitus)   HTN (hypertension)   Morbid obesity   UTI (urinary tract infection)   Reflux esophagitis   Gastroparesis    Time spent: 35 MINS    Elmarie Shiley MD  [facility] Pager [phone]. If 7PM-7AM, please contact night-coverage at [email or web address], password [facility] 11/15/2013, 11:52 AM  LOS: 5 days

## 2013-11-15 NOTE — Progress Notes (Signed)
Utilization review completed. Carely Nappier, RN, BSN. 

## 2013-11-16 LAB — GLUCOSE, CAPILLARY
Glucose-Capillary: 182 mg/dL — ABNORMAL HIGH (ref 70–99)
Glucose-Capillary: 169 mg/dL — ABNORMAL HIGH (ref 70–99)
Glucose-Capillary: 157 mg/dL — ABNORMAL HIGH (ref 70–99)
Glucose-Capillary: 203 mg/dL — ABNORMAL HIGH (ref 70–99)

## 2013-11-16 MED ORDER — PREDNISONE 20 MG PO TABS
40.0000 mg | ORAL_TABLET | Freq: Every day | ORAL | Status: DC
  Administered 2013-11-16 – 2013-11-18 (×3): 40 mg via ORAL
  Filled 2013-11-16 (×4): qty 2

## 2013-11-16 MED ORDER — HYDROMORPHONE HCL PF 1 MG/ML IJ SOLN
0.5000 mg | INTRAMUSCULAR | Status: DC | PRN
  Administered 2013-11-16 – 2013-11-18 (×8): 0.5 mg via INTRAVENOUS
  Filled 2013-11-16 (×8): qty 1

## 2013-11-16 MED ORDER — INSULIN ASPART 100 UNIT/ML ~~LOC~~ SOLN
0.0000 [IU] | Freq: Four times a day (QID) | SUBCUTANEOUS | Status: DC
  Administered 2013-11-16: 5 [IU] via SUBCUTANEOUS
  Administered 2013-11-16 (×2): 3 [IU] via SUBCUTANEOUS

## 2013-11-16 MED ORDER — TRAMADOL HCL 50 MG PO TABS
50.0000 mg | ORAL_TABLET | Freq: Three times a day (TID) | ORAL | Status: DC | PRN
Start: 2013-11-16 — End: 2013-11-18
  Administered 2013-11-17: 50 mg via ORAL
  Filled 2013-11-16: qty 1

## 2013-11-16 NOTE — Progress Notes (Signed)
[facility] PROGRESS NOTE  [name] XLK:[id number] DOB: 07-28-1971 DOA: 11/10/2013 PCP: Theressa Millard, MD  Assessment/Plan: #1 intractable nausea and vomiting Questionable etiology. Felt to be secondary to clinical gastroparesis. Patient currently on IV erythromycin.  on scheduled Zofran 8 mg 3 times daily. Clear liquids. IV fluids. Anti-medics.  She relates she was doing very well. She started to have nausea and vomiting again few dasy ago after byetta was started.  Her GI Dr at [facility] thought her nausea and vomiting was related to liver mass. She received radiation therapy. Her symptoms resolved after that.  CT abdomen and pelvis, no acute finding, prior live mass reduce in size.  She doesn't tolerates reglan.  Change solu-cortef to prednisone.  Feeling better. Advance diet to carb modified.  Stop IV dilaudid.   #2 urinary tract infection Received 5 days of ceftriaxone.  urine culture no growth.   #3 diabetes mellitus Sliding scale insulin.  #4 history of lupus/Sjogren's syndrome Stable. Continue Imuran and  prednisone Increase solu cortef in case adrenal insufficiency is  causing symptoms.   #5 prophylaxis PPI for GI prophylaxis. Lovenox for DVT prophylaxis.  6-Hypokalemia; resolved.  7-Hypertension; Coreg, Imdur. PRN hydralazine.    Code Status: full Family Communication: updated patient no family at bedside. Disposition Plan: home when medically stable.   Consultants:  none  Procedures:  Acute abdominal series 11/11/2013  Antibiotics:  IV Rocephin 11/11/2013  On IV erythromycin 11/11/13  HPI/Subjective: Feeling better. Tolerating full liquid diet.  milld tenderness.  Objective: Filed Vitals:   11/16/13 0538  BP: 145/75  Pulse:   Temp:   Resp:     Intake/Output Summary (Last 24 hours) at 11/16/13 1009 Last data filed at 11/15/13 1429  Gross per 24 hour  Intake    150 ml  Output      1 ml  Net    149 ml   Filed Weights   11/10/13 2219 11/11/13 0457  Weight: 166.924 kg (368 lb) 176.676 kg (389 lb 8 oz)    Exam:   General:  nad  Cardiovascular: rrr  Respiratory: ctab  Abdomen: soft, nontender, nondistended, positive bowel sounds.  Musculoskeletal: no clubbing cyanosis or edema  Data Reviewed: Basic Metabolic Panel:  Recent Labs Lab 11/10/13 2325 11/11/13 0652 11/12/13 0650 11/14/13 0405 11/15/13 0635  NA 143 146 145 143 144  K 3.9 4.0 3.9 3.3* 3.8  CL 105 106 109 105 107  CO2 23 25 23 22 24   GLUCOSE 89 104* 134* 126* 164*  BUN 10 12 11  4* 4*  CREATININE 0.93 1.00 0.89 0.91 0.89  CALCIUM 9.5 8.9 8.7 8.9 8.8   Liver Function Tests:  Recent Labs Lab 11/10/13 2325 11/11/13 0652 11/14/13 0405  AST 14 13 14   ALT 6 6 8   ALKPHOS 82 79 72  BILITOT 0.4 0.3 0.3  PROT 7.3 6.9 6.6  ALBUMIN 3.0* 2.9* 2.9*    Recent Labs Lab 11/10/13 2325  LIPASE 23   No results found for this basename: AMMONIA,  in the last 168 hours CBC:  Recent Labs Lab 11/10/13 2325 11/11/13 0652 11/12/13 0650 11/14/13 0405 11/15/13 0635  WBC 12.0* 8.1 8.1 11.1* 9.8  NEUTROABS 9.3*  --   --   --   --   HGB 11.2* 10.4* 9.8* 9.8* 9.7*  HCT 35.2* 32.8* 32.3* 31.5* 30.6*  MCV 87.1 87.9 89.2 87.5 87.4  PLT 302 287 269 265 246   Cardiac Enzymes: No results found for this basename: CKTOTAL, CKMB, CKMBINDEX, TROPONINI,  in the last 168 hours BNP (last 3 results)  Recent Labs  02/25/13 1918 04/01/13 1228 04/18/13 2235  PROBNP 162.5* 29.6 231.5*   CBG:  Recent Labs Lab 11/15/13 1218 11/15/13 1654 11/15/13 2359 11/16/13 0601 11/16/13 0855  GLUCAP 174* 183* 178* 182* 169*    Recent Results (from the past 240 hour(s))  URINE CULTURE     Status: None   Collection Time    11/13/13  7:36 PM      Result Value Ref Range Status   Specimen Description URINE, CLEAN CATCH   Final   Special Requests NONE   Final   Culture  Setup Time     Final   Value: 11/13/2013 20:59     Performed at [place]     Final   Value: NO GROWTH     Performed at [facility]   Culture     Final   Value: NO GROWTH     Performed at [facility]   Report Status 11/15/2013 FINAL   Final  URINE CULTURE     Status: None   Collection Time    11/14/13  3:36 PM      Result Value Ref Range Status   Specimen Description URINE, CLEAN CATCH   Final   Special Requests NONE   Final   Culture  Setup Time     Final   Value: 11/14/2013 19:01     Performed at [place]     Final   Value: 25,000 COLONIES/ML     Performed at [facility]   Culture     Final   Value: Multiple bacterial morphotypes present, none predominant. Suggest appropriate recollection if clinically indicated.     Performed at [facility]   Report Status 11/15/2013 FINAL   Final     Studies: No results found.  Scheduled Meds: . antiseptic oral rinse  15 mL Mouth Rinse BID  . aspirin  81 mg Oral Daily  . azaTHIOprine  50 mg Oral BID  . carvedilol  3.125 mg Oral BID WC  . enoxaparin (LOVENOX) injection  90 mg Subcutaneous Q24H  . erythromycin  500 mg Intravenous 3 times per day  . insulin aspart  0-15 Units Subcutaneous 4 times per day  . isosorbide-hydrALAZINE  1 tablet Oral TID  . ondansetron (ZOFRAN) IV  8 mg Intravenous 3 times per day  . pantoprazole  40 mg Oral BID  . predniSONE  40 mg Oral Q breakfast   Continuous Infusions:    Principal Problem:   Intractable nausea and vomiting Active Problems:   SLE   SJOGREN'S SYNDROME   DM (diabetes mellitus)   HTN (hypertension)   Morbid obesity   UTI (urinary tract infection)   Reflux esophagitis   Gastroparesis    Time spent: 30 MINS    Elmarie Shiley MD  [facility] Pager [phone]. If 7PM-7AM, please contact night-coverage at [email or web address], password [facility] 11/16/2013, 10:09 AM  LOS: 6 days

## 2013-11-17 LAB — GLUCOSE, CAPILLARY
GLUCOSE-CAPILLARY: 158 mg/dL — AB (ref 70–99)
GLUCOSE-CAPILLARY: 210 mg/dL — AB (ref 70–99)
GLUCOSE-CAPILLARY: 231 mg/dL — AB (ref 70–99)
GLUCOSE-CAPILLARY: 270 mg/dL — AB (ref 70–99)
Glucose-Capillary: 304 mg/dL — ABNORMAL HIGH (ref 70–99)

## 2013-11-17 MED ORDER — HYDROCODONE-ACETAMINOPHEN 5-325 MG PO TABS
1.0000 | ORAL_TABLET | Freq: Four times a day (QID) | ORAL | Status: DC | PRN
  Administered 2013-11-17 – 2013-11-18 (×2): 1 via ORAL
  Filled 2013-11-17 (×2): qty 1

## 2013-11-17 MED ORDER — INSULIN ASPART 100 UNIT/ML ~~LOC~~ SOLN
0.0000 [IU] | Freq: Three times a day (TID) | SUBCUTANEOUS | Status: DC
  Administered 2013-11-17: 5 [IU] via SUBCUTANEOUS
  Administered 2013-11-17: 3 [IU] via SUBCUTANEOUS
  Administered 2013-11-17: 8 [IU] via SUBCUTANEOUS
  Administered 2013-11-18: 09:00:00 via SUBCUTANEOUS
  Administered 2013-11-18: 5 [IU] via SUBCUTANEOUS

## 2013-11-17 MED ORDER — TORSEMIDE 20 MG PO TABS
20.0000 mg | ORAL_TABLET | Freq: Two times a day (BID) | ORAL | Status: DC
  Administered 2013-11-17 – 2013-11-18 (×3): 20 mg via ORAL
  Filled 2013-11-17 (×5): qty 1

## 2013-11-17 NOTE — Progress Notes (Signed)
[facility] PROGRESS NOTE  [name] [id number]:[id number] DOB: [birth year] DOA: 11/10/2013 PCP: Theressa Millard, MD  Assessment/Plan: #1 intractable nausea and vomiting Questionable etiology. Felt to be secondary to clinical gastroparesis. Patient currently on IV erythromycin.  on scheduled Zofran 8 mg 3 times daily. Clear liquids. IV fluids. Anti-medics.  She relates she was doing very well. She started to have nausea and vomiting again few dasy ago after byetta was started.  Her GI Dr at [facility] thought her nausea and vomiting was related to liver mass. She received radiation therapy. Her symptoms resolved after that.  CT abdomen and pelvis, no acute finding, prior live mass reduce in size.  She doesn't tolerates reglan.  Change solu-cortef to prednisone.  Feeling better. Advance diet to carb modified.    #2 urinary tract infection Received 5 days of ceftriaxone.  urine culture no growth.   #3 diabetes mellitus Sliding scale insulin.  #4 history of lupus/Sjogren's syndrome Stable. Continue Imuran and  prednisone Increase solu cortef in case adrenal insufficiency is  causing symptoms.  Relates generalized bone pain.  Prednisone dose increased.   #5 prophylaxis PPI for GI prophylaxis. Lovenox for DVT prophylaxis.  6-Hypokalemia; resolved.  7-Hypertension; Coreg, Imdur. PRN hydralazine. Resume torsemide.    Code Status: full Family Communication: updated patient no family at bedside. Disposition Plan: home 4-20   Consultants:  none  Procedures:  Acute abdominal series 11/11/2013  Antibiotics:  IV Rocephin 11/11/2013  On IV erythromycin 11/11/13  HPI/Subjective: She is eating more. Nausea abdominal pain better. She feels her lupus is acting up. She relates bone pain.   Objective: Filed Vitals:   11/17/13 0541  BP: 155/83  Pulse: 79  Temp: 98.4 F (36.9 C)  Resp: 20    Intake/Output Summary (Last 24 hours) at 11/17/13 1333 Last data filed at  11/17/13 0542  Gross per 24 hour  Intake    355 ml  Output    100 ml  Net    255 ml   Filed Weights   11/10/13 2219 11/11/13 0457  Weight: 166.924 kg (368 lb) 176.676 kg (389 lb 8 oz)    Exam:   General:  nad  Cardiovascular: rrr  Respiratory: ctab  Abdomen: soft, nontender, nondistended, positive bowel sounds.  Musculoskeletal: no clubbing cyanosis or edema  Data Reviewed: Basic Metabolic Panel:  Recent Labs Lab 11/10/13 2325 11/11/13 0652 11/12/13 0650 11/14/13 0405 11/15/13 0635  NA 143 146 145 143 144  K 3.9 4.0 3.9 3.3* 3.8  CL 105 106 109 105 107  CO2 23 25 23 22 24   GLUCOSE 89 104* 134* 126* 164*  BUN 10 12 11  4* 4*  CREATININE 0.93 1.00 0.89 0.91 0.89  CALCIUM 9.5 8.9 8.7 8.9 8.8   Liver Function Tests:  Recent Labs Lab 11/10/13 2325 11/11/13 0652 11/14/13 0405  AST 14 13 14   ALT 6 6 8   ALKPHOS 82 79 72  BILITOT 0.4 0.3 0.3  PROT 7.3 6.9 6.6  ALBUMIN 3.0* 2.9* 2.9*    Recent Labs Lab 11/10/13 2325  LIPASE 23   No results found for this basename: AMMONIA,  in the last 168 hours CBC:  Recent Labs Lab 11/10/13 2325 11/11/13 0652 11/12/13 0650 11/14/13 0405 11/15/13 0635  WBC 12.0* 8.1 8.1 11.1* 9.8  NEUTROABS 9.3*  --   --   --   --   HGB 11.2* 10.4* 9.8* 9.8* 9.7*  HCT 35.2* 32.8* 32.3* 31.5* 30.6*  MCV 87.1 87.9 89.2 87.5 87.4  PLT  302 287 269 265 246   Cardiac Enzymes: No results found for this basename: CKTOTAL, CKMB, CKMBINDEX, TROPONINI,  in the last 168 hours BNP (last 3 results)  Recent Labs  02/25/13 1918 04/01/13 1228 04/18/13 2235  PROBNP 162.5* 29.6 231.5*   CBG:  Recent Labs Lab 11/16/13 1225 11/16/13 1727 11/17/13 0014 11/17/13 0746 11/17/13 1159  GLUCAP 157* 203* 210* 158* 231*    Recent Results (from the past 240 hour(s))  URINE CULTURE     Status: None   Collection Time    11/13/13  7:36 PM      Result Value Ref Range Status   Specimen Description URINE, CLEAN CATCH   Final   Special  Requests NONE   Final   Culture  Setup Time     Final   Value: 11/13/2013 20:59     Performed at [place]     Final   Value: NO GROWTH     Performed at [facility]   Culture     Final   Value: NO GROWTH     Performed at [facility]   Report Status 11/15/2013 FINAL   Final  URINE CULTURE     Status: None   Collection Time    11/14/13  3:36 PM      Result Value Ref Range Status   Specimen Description URINE, CLEAN CATCH   Final   Special Requests NONE   Final   Culture  Setup Time     Final   Value: 11/14/2013 19:01     Performed at [place]     Final   Value: 25,000 COLONIES/ML     Performed at [facility]   Culture     Final   Value: Multiple bacterial morphotypes present, none predominant. Suggest appropriate recollection if clinically indicated.     Performed at [facility]   Report Status 11/15/2013 FINAL   Final     Studies: No results found.  Scheduled Meds: . antiseptic oral rinse  15 mL Mouth Rinse BID  . aspirin  81 mg Oral Daily  . azaTHIOprine  50 mg Oral BID  . carvedilol  3.125 mg Oral BID WC  . enoxaparin (LOVENOX) injection  90 mg Subcutaneous Q24H  . erythromycin  500 mg Intravenous 3 times per day  . insulin aspart  0-15 Units Subcutaneous TID WC  . isosorbide-hydrALAZINE  1 tablet Oral TID  . ondansetron (ZOFRAN) IV  8 mg Intravenous 3 times per day  . pantoprazole  40 mg Oral BID  . predniSONE  40 mg Oral Q breakfast  . torsemide  20 mg Oral BID   Continuous Infusions:    Principal Problem:   Intractable nausea and vomiting Active Problems:   SLE   SJOGREN'S SYNDROME   DM (diabetes mellitus)   HTN (hypertension)   Morbid obesity   UTI (urinary tract infection)   Reflux esophagitis   Gastroparesis    Time spent: 30 MINS    Elmarie Shiley MD  [facility] Pager [phone]. If 7PM-7AM, please contact night-coverage at [email or web address],  password [email or web address] 11/17/2013, 1:33 PM  LOS: 7 days

## 2013-11-18 LAB — BASIC METABOLIC PANEL
BUN: 9 mg/dL (ref 6–23)
CALCIUM: 8.6 mg/dL (ref 8.4–10.5)
CHLORIDE: 101 meq/L (ref 96–112)
CO2: 27 meq/L (ref 19–32)
Creatinine, Ser: 1.05 mg/dL (ref 0.50–1.10)
GFR calc non Af Amer: 64 mL/min — ABNORMAL LOW (ref >90)
GFR, EST AFRICAN AMERICAN: 74 mL/min — AB (ref >90)
Glucose, Bld: 168 mg/dL — ABNORMAL HIGH (ref 70–99)
Potassium: 2.9 mEq/L — CL (ref 3.7–5.3)
SODIUM: 143 meq/L (ref 137–147)

## 2013-11-18 LAB — GLUCOSE, CAPILLARY
Glucose-Capillary: 154 mg/dL — ABNORMAL HIGH (ref 70–99)
Glucose-Capillary: 214 mg/dL — ABNORMAL HIGH (ref 70–99)

## 2013-11-18 MED ORDER — ONDANSETRON HCL 4 MG PO TABS: 4.0000 mg | ORAL_TABLET | Freq: Three times a day (TID) | ORAL | Status: DC | PRN

## 2013-11-18 MED ORDER — ERYTHROMYCIN BASE 500 MG PO TABS: 500.0000 mg | ORAL_TABLET | Freq: Three times a day (TID) | ORAL | Status: DC

## 2013-11-18 MED ORDER — PREDNISONE 20 MG PO TABS: 40.0000 mg | ORAL_TABLET | Freq: Every day | ORAL | Status: DC

## 2013-11-18 MED ORDER — POTASSIUM CHLORIDE CRYS ER 20 MEQ PO TBCR
40.0000 meq | EXTENDED_RELEASE_TABLET | Freq: Once | ORAL | Status: AC
  Administered 2013-11-18: 40 meq via ORAL
  Filled 2013-11-18: qty 2

## 2013-11-18 MED ORDER — HYDROCODONE-ACETAMINOPHEN 5-325 MG PO TABS: 1.0000 | ORAL_TABLET | Freq: Four times a day (QID) | ORAL | Status: DC | PRN

## 2013-11-18 MED ORDER — INSULIN REGULAR HUMAN (CONC) 500 UNIT/ML ~~LOC~~ SOLN: 10.0000 [IU] | Freq: Three times a day (TID) | SUBCUTANEOUS | Status: DC

## 2013-11-18 MED ORDER — PANTOPRAZOLE SODIUM 40 MG PO TBEC: 40.0000 mg | DELAYED_RELEASE_TABLET | Freq: Two times a day (BID) | ORAL | Status: DC

## 2013-11-18 NOTE — Progress Notes (Signed)
Discharge instructions verbalized to patient and son at bedside.  All questions answered thoroughly, both verbalized understanding throughout.  Patient preference to receive CD x-ray upon discharge.  Patient prefers to stop at x-ray to pick-up CD's.  Needs attended to.  VS WNL.  Dirk Dress 11/18/2013 1:50 PM

## 2013-11-18 NOTE — Discharge Summary (Signed)
Physician Discharge Summary  [name] [id number] DOB: [birth year] DOA: 11/10/2013  PCP: Theressa Millard, MD  Admit date: 11/10/2013 Discharge date: 11/18/2013  Time spent: 35 minutes  Recommendations for Outpatient Follow-up:  1. Need to follow up with Hepatology for further care liver mass.  2. Needs to follow up with Primary Rheumatologist.   Discharge Diagnoses:    Intractable nausea and vomiting, ? Gastroparesis.    SLE   SJOGREN'S SYNDROME   DM (diabetes mellitus)   HTN (hypertension)   Morbid obesity   UTI (urinary tract infection)   Reflux esophagitis   Gastroparesis   Discharge Condition: Stable.   Diet recommendation: Carb modified.   Filed Weights   11/10/13 2219 11/11/13 0457  Weight: 166.924 kg (368 lb) 176.676 kg (389 lb 8 oz)    History of present illness:  [name] is a 43 y.o. female with Past medical history of Sjgren syndrome, diabetes mellitus, clinically diagnosed gastroparesis, hepatic coma status post embolization, GERD with reflux esophagitis on EGD, hypertension, morbid obesity.  The patient presented with complaints of nausea vomiting ongoing since last one day. She will go to the recurrent nausea and had multiple episodes of vomiting which is nonbloody and bilious. She also has chronic upper abdominal pain which she describes as progressively worsening. She denies any fever or chills. She denies any diarrhea. Last bowel movement was yesterday. She is not passing gas. She does this is like her prior episodes of intractable nausea and vomiting. She is unable to keep her medication down which was her biggest concern.  She has been clinically diagnosed with gastroparesis and was treated with IV erythromycin in the hospital but as an outpatient was not continued on that. She cannot tolerate Reglan.  She was recommended to followup with gastroenterology at [facility] for possible nuclear study, she did followup with surgery and rheumatology but  unfortunately not with gastroenterology.  Recently she changed her PCP who started her on Byetta, and she has been using that for since last one week.  The patient is coming from home. And at her baseline Independent for most of her ADL.   Hospital Course:  1 intractable nausea and vomiting  Questionable etiology. Felt to be secondary to clinical gastroparesis. Patient currently on IV erythromycin. on scheduled Zofran 8 mg 3 times daily.  IV fluids. Anti-medics. She is tolerating diet. Change erythromycin to oral for one more week.  She relates she was doing very well. She started to have nausea and vomiting again few dasy ago after byetta was started.  Her GI Dr at [facility] thought her nausea and vomiting was related to liver mass. She received radiation therapy. Her symptoms resolved after that. CT abdomen and pelvis, no acute finding, prior live mass reduce in size. She doesn't tolerates reglan.  Change solu-cortef to prednisone. Feeling better. Advance diet to carb modified. Needs to follow up at [facility] for further care.    2 urinary tract infection  Received 5 days of ceftriaxone.  urine culture no growth.   3 diabetes mellitus  Byetta discontinue. Resume home insulin regime, lower dose to avoid hypoglycemia.   #4 history of lupus/Sjogren's syndrome  Stable. Continue Imuran and prednisone  Increase solu cortef in case adrenal insufficiency is causing symptoms.  Relates generalized bone pain.  Prednisone dose increased.  #5 prophylaxis  PPI for GI prophylaxis. Lovenox for DVT prophylaxis.  6-Hypokalemia; resolved.  7-Hypertension; Coreg, Imdur. PRN hydralazine. Resume torsemide.      Procedures:  none  Consultations:  none  Discharge Exam: Filed Vitals:   11/18/13 0506  BP: 129/75  Pulse: 76  Temp: 98.8 F (37.1 C)  Resp: 18    General: No distress.  Cardiovascular: S 1, S 2 RRR Respiratory: CTA Abdomen; soft, NT  Discharge Instructions You were cared for  by a hospitalist during your hospital stay. If you have any questions about your discharge medications or the care you received while you were in the hospital after you are discharged, you can call the unit and asked to speak with the hospitalist on call if the hospitalist that took care of you is not available. Once you are discharged, your primary care physician will handle any further medical issues. Please note that NO REFILLS for any discharge medications will be authorized once you are discharged, as it is imperative that you return to your primary care physician (or establish a relationship with a primary care physician if you do not have one) for your aftercare needs so that they can reassess your need for medications and monitor your lab values.  Discharge Orders   Future Appointments Provider Department Dept Phone   12/11/2013 8:30 AM Renato Shin, MD [facility] Endocrinology [phone]   12/11/2013 9:00 AM [facility] [phone]   12/11/2013 9:45 AM [facility] [phone]   01/08/2014 9:00 AM Lahoma Crocker, MD [place]   Future Orders Complete By Expires   Diet Carb Modified  As directed    Increase activity slowly  As directed        Medication List    STOP taking these medications       exenatide 5 MCG/0.02ML Sopn injection  Commonly known as:  BYETTA     medroxyPROGESTERone 10 MG tablet  Commonly known as:  PROVERA     traMADol 50 MG tablet  Commonly known as:  ULTRAM      TAKE these medications       alendronate 70 MG tablet  Commonly known as:  FOSAMAX  Take 70 mg by mouth every 7 (seven) days. saturday. Take with a full glass of water on an empty stomach.     ALPRAZolam 0.5 MG tablet  Commonly known as:  XANAX  Take 0.5 mg by mouth 2 (two) times daily as needed for sleep or anxiety.     aspirin EC 81 MG tablet  Take 81  mg by mouth daily.     azaTHIOprine 50 MG tablet  Commonly known as:  IMURAN  Take 100 mg by mouth 2 (two) times daily.     cetirizine 10 MG tablet  Commonly known as:  ZYRTEC  Take 10 mg by mouth daily.     citalopram 10 MG tablet  Commonly known as:  CELEXA  Take 1 tablet (10 mg total) by mouth every evening.     clopidogrel 75 MG tablet  Commonly known as:  PLAVIX  Take 75 mg by mouth daily.     COREG 3.125 MG tablet  Generic drug:  carvedilol  Take 3.125 mg by mouth 2 (two) times daily with a meal.     cycloSPORINE 0.05 % ophthalmic emulsion  Commonly known as:  RESTASIS  Place 1 drop into both eyes 3 (three) times daily.     EASY TOUCH INSULIN SYRINGE 30G X 1/2" 1 ML Misc  Generic drug:  Insulin Syringe-Needle U-100  USE AS DIRECTED THREE TIMES A DAY  EASY TOUCH INSULIN SYRINGE 31G X 5/16" 0.3 ML Misc  Generic drug:  Insulin Syringe-Needle U-100  USE AS DIRECTED THREE TIMES A DAY     erythromycin base 500 MG tablet  Commonly known as:  E-MYCIN  Take 1 tablet (500 mg total) by mouth 3 (three) times daily.     famotidine 20 MG tablet  Commonly known as:  PEPCID  Take 20 mg by mouth 2 (two) times daily.     gabapentin 300 MG capsule  Commonly known as:  NEURONTIN  Take 300 mg by mouth 2 (two) times daily.     HYDROcodone-acetaminophen 5-325 MG per tablet  Commonly known as:  NORCO/VICODIN  Take 1 tablet by mouth every 6 (six) hours as needed for moderate pain.     hydroxychloroquine 200 MG tablet  Commonly known as:  PLAQUENIL  Take 200 mg by mouth 2 (two) times daily.     insulin regular human CONCENTRATED 500 UNIT/ML Soln injection  Commonly known as:  HUMULIN R  Inject 0.02 mLs (10 Units total) into the skin 3 (three) times daily with meals. Because it is concentrated, please draw up as though it is just 10 units at a time.     isosorbide-hydrALAZINE 20-37.5 MG per tablet  Commonly known as:  BIDIL  Take 1 tablet by mouth 3 (three) times daily.      metFORMIN 500 MG tablet  Commonly known as:  GLUCOPHAGE  Take 500 mg by mouth 2 (two) times daily with a meal.     norethindrone 0.35 MG tablet  Commonly known as:  MICRONOR,CAMILA,ERRIN  Take 1 tablet (0.35 mg total) by mouth daily.     ondansetron 4 MG tablet  Commonly known as:  ZOFRAN  Take 1 tablet (4 mg total) by mouth every 8 (eight) hours as needed for nausea or vomiting.     pantoprazole 40 MG tablet  Commonly known as:  PROTONIX  Take 1 tablet (40 mg total) by mouth 2 (two) times daily.     potassium chloride SA 20 MEQ tablet  Commonly known as:  K-DUR,KLOR-CON  Take 20 mEq by mouth 2 (two) times daily.     predniSONE 20 MG tablet  Commonly known as:  DELTASONE  Take 2 tablets (40 mg total) by mouth daily with breakfast.     PROAIR HFA 108 (90 BASE) MCG/ACT inhaler  Generic drug:  albuterol  Inhale 2 puffs into the lungs every 6 (six) hours as needed. For shortness of breath/wheeze     promethazine 25 MG tablet  Commonly known as:  PHENERGAN  Take 25 mg by mouth every 6 (six) hours as needed for nausea or vomiting.     torsemide 20 MG tablet  Commonly known as:  DEMADEX  Take 1 tablet (20 mg total) by mouth 2 (two) times daily.     traZODone 50 MG tablet  Commonly known as:  DESYREL  Take 1 tablet (50 mg total) by mouth at bedtime.       Allergies  Allergen Reactions  . Oxycodone-Acetaminophen Hives    FYI: no reaction to hydrocodone; tolerates dilaudid  . Codeine Other (See Comments)    (Derm)  . Metoclopramide     Developed restless leg, akathisia type limb movements.   . Oxycodone-Acetaminophen Other (See Comments)       Follow-up Information   Follow up with Theressa Millard, MD In 1 week.   Specialty:  Internal Medicine   Contact information:   [place]  [place] [id number] [phone]        The results of significant diagnostics from this hospitalization (including imaging, microbiology, ancillary and  laboratory) are listed below for reference.    Significant Diagnostic Studies: Ct Abdomen Pelvis W Contrast  11/13/2013   CLINICAL DATA:  Abdominal pain, nausea.  EXAM: CT ABDOMEN AND PELVIS WITH CONTRAST  TECHNIQUE: Multidetector CT imaging of the abdomen and pelvis was performed using the standard protocol following bolus administration of intravenous contrast.  CONTRAST:  194mL OMNIPAQUE IOHEXOL 300 MG/ML  SOLN  COMPARISON:  DG ABD ACUTE W/CHEST dated 11/11/2013; US PELVIS COMPLETE dated 09/06/2013; CT ABD/PELVIS W CM dated 04/19/2013; CT ABD/PELVIS W CM dated 03/26/2013; CT ABD/PELV WO/W dated 02/20/2013; MR ABDOMEN WO/W CM dated 02/24/2013  FINDINGS: Mild cardiomegaly. Linear densities in the left base compatible with subsegmental atelectasis. Right lung bases clear. No effusions.  Previously seen hypodense lesion within the liver has significantly improved since prior study. This currently measures approximately 2.5 x 2.2 cm on image 34 of series 2 compared with 10.6 cm previously. No new hepatic lesion. Gallbladder, spleen, pancreas, adrenals and kidneys are unremarkable.  Uterus, adnexae and urinary bladder unremarkable. Stomach, large and small bowel are grossly unremarkable. No evidence of bowel obstruction. No free fluid, free air or adenopathy. Stable soft tissue nodule/mass in the region of the umbilicus.  No acute bony abnormality or focal bone lesion.  IMPRESSION: Significant improvement in the hypodense lesion within the right hepatic lobe, currently measuring 2.5 cm compared with 10.6 cm previously.  Cardiomegaly.  No acute findings in the abdomen or pelvis.   Electronically Signed   By: Rolm Baptise M.D.   On: 11/13/2013 17:25   Dg Abd Acute W/chest  11/11/2013   CLINICAL DATA:  Possible gastroparesis with nausea, vomiting and abdominal pain.  EXAM: ACUTE ABDOMEN SERIES (ABDOMEN 2 VIEW & CHEST 1 VIEW)  COMPARISON:  DG CHEST 2 VIEW dated 07/15/2013; DG ABD 2 VIEWS dated 04/01/2013  FINDINGS: The  cardiac silhouette appears mild to moderately enlarged, unchanged with mild central pulmonary vasculature congestion. Lungs are clear, no pleural effusions. No pneumothorax. Soft tissue planes and included osseous structures are unremarkable.  Bowel gas pattern is nondilated and nonobstructive. No intra-abdominal mass effect, pathologic calcifications or free air. Soft tissue planes and included osseous structures are nonsuspicious. Safety pins project in the pelvis are likely external to the patient.  IMPRESSION: Stable cardiomegaly and central pulmonary vasculature congestion.  Nonspecific bowel gas pattern.   Electronically Signed   By: Elon Alas   On: 11/11/2013 04:51    Microbiology: Recent Results (from the past 240 hour(s))  URINE CULTURE     Status: None   Collection Time    11/13/13  7:36 PM      Result Value Ref Range Status   Specimen Description URINE, CLEAN CATCH   Final   Special Requests NONE   Final   Culture  Setup Time     Final   Value: 11/13/2013 20:59     Performed at [facility]     Final   Value: NO GROWTH     Performed at [facility]   Culture     Final   Value: NO GROWTH     Performed at [facility]   Report Status 11/15/2013 FINAL   Final  URINE CULTURE     Status: None   Collection Time    11/14/13  3:36 PM  Result Value Ref Range Status   Specimen Description URINE, CLEAN CATCH   Final   Special Requests NONE   Final   Culture  Setup Time     Final   Value: 11/14/2013 19:01     Performed at [place]     Final   Value: 25,000 COLONIES/ML     Performed at [facility]   Culture     Final   Value: Multiple bacterial morphotypes present, none predominant. Suggest appropriate recollection if clinically indicated.     Performed at [facility]   Report Status 11/15/2013 FINAL   Final     Labs: Basic Metabolic Panel:  Recent Labs Lab 11/12/13 0650  11/14/13 0405 11/15/13 0635 11/18/13 0418  NA 145 143 144 143  K 3.9 3.3* 3.8 2.9*  CL 109 105 107 101  CO2 23 22 24 27   GLUCOSE 134* 126* 164* 168*  BUN 11 4* 4* 9  CREATININE 0.89 0.91 0.89 1.05  CALCIUM 8.7 8.9 8.8 8.6   Liver Function Tests:  Recent Labs Lab 11/14/13 0405  AST 14  ALT 8  ALKPHOS 72  BILITOT 0.3  PROT 6.6  ALBUMIN 2.9*   No results found for this basename: LIPASE, AMYLASE,  in the last 168 hours No results found for this basename: AMMONIA,  in the last 168 hours CBC:  Recent Labs Lab 11/12/13 0650 11/14/13 0405 11/15/13 0635  WBC 8.1 11.1* 9.8  HGB 9.8* 9.8* 9.7*  HCT 32.3* 31.5* 30.6*  MCV 89.2 87.5 87.4  PLT 269 265 246   Cardiac Enzymes: No results found for this basename: CKTOTAL, CKMB, CKMBINDEX, TROPONINI,  in the last 168 hours BNP: BNP (last 3 results)  Recent Labs  02/25/13 1918 04/01/13 1228 04/18/13 2235  PROBNP 162.5* 29.6 231.5*   CBG:  Recent Labs Lab 11/17/13 0014 11/17/13 0746 11/17/13 1159 11/17/13 1701 11/17/13 2123  GLUCAP 210* 158* 231* 270* 304*       Signed:  Romello Hoehn A Era Parr  [facility] 11/18/2013, 7:35 AM

## 2013-11-18 NOTE — Plan of Care (Signed)
Problem: Food- and Nutrition-Related Knowledge Deficit (NB-1.1) Goal: Nutrition education Formal process to instruct or train a patient/client in a skill or to impart knowledge to help patients/clients voluntarily manage or modify food choices and eating behavior to maintain or improve health. Outcome: Completed/Met Date Met:  11/18/13  RD consulted for nutrition education regarding gastroparesis. RD provided "Gastroparesis Nutrition Therapy" handout from the Academy of [employer]etics. Discussed different food groups and their fiber and fat content. Encouraged small, frequent meals to help aid in digestions. Teach back method used.  Expect good compliance.  Body mass index is 59.24 kg/(m^2).Marland Kitchen Pt meets criteria for obese class III weight based on current BMI.  Current diet order is Carbohydrate Modified Medium, patient is consuming approximately 50% of meals at this time. Labs and medications reviewed. No further nutrition interventions warranted at this time. RD contact information provided. If additional nutrition issues arise, please re-consult RD.  Inda Coke MS, RD, LDN Pager: [phone] After-hours pager: [phone]

## 2013-11-18 NOTE — Progress Notes (Signed)
Nutrition Brief Note  Patient identified on the Malnutrition Screening Tool (MST) Report. Pt with overall stable weight. Pt is tolerating small amounts of meals.  Wt Readings from Last 15 Encounters:  11/11/13 389 lb 8 oz (176.676 kg)  10/03/13 368 lb (166.924 kg)  09/10/13 376 lb (170.552 kg)  06/21/13 388 lb 11.2 oz (176.313 kg)  05/22/13 383 lb 12.8 oz (174.091 kg)  05/17/13 382 lb (173.274 kg)  04/29/13 383 lb (173.728 kg)  04/28/13 379 lb (171.913 kg)  04/15/13 387 lb (175.542 kg)  04/06/13 376 lb 1.4 oz (170.592 kg)  03/11/13 383 lb (173.728 kg)  03/04/13 399 lb 14.6 oz (181.4 kg)  03/04/13 399 lb 14.6 oz (181.4 kg)  02/20/13 391 lb 8.6 oz (177.6 kg)  02/18/13 397 lb (180.078 kg)    Body mass index is 59.24 kg/(m^2). Patient meets criteria for Obese Class III based on current BMI.   Current diet order is Carbohydrate Modified Medium, patient is consuming approximately 50% of meals at this time. Labs and medications reviewed.   No nutrition interventions warranted at this time. If nutrition issues arise, please consult RD.   Inda Coke MS, RD, LDN Inpatient Registered Dietitian Pager: [phone] After-hours pager: [phone]

## 2013-11-18 NOTE — Progress Notes (Signed)
Patient discharged to home.  Patient alert, oriented, verbally responsive, breathing regular and non-labored throughout, no s/s of distress noted throughout, no c/o pain throughout.  Patient left unit per wheelchair accompanied by transportation.  VS WNL.  Discharge instructions with patient and prescriptions with patient.  Dirk Dress 11/18/2013 .

## 2013-11-18 NOTE — Care Management Note (Signed)
    Page 1 of 1   11/18/2013     5:33:05 PM CARE MANAGEMENT NOTE 11/18/2013  Patient:  [facility]   Account Number:  [id number]  Date Initiated:  11/18/2013  Documentation initiated by:  Tomi Bamberger  Subjective/Objective Assessment:   dx n/v  admit- lives alone.     Action/Plan:   Anticipated DC Date:  11/18/2013   Anticipated DC Plan:  [place]  CM consult      Choice offered to / List presented to:             Status of service:  Completed, signed off Medicare Important Message given?   (If response is "NO", the following Medicare IM given date fields will be blank) Date Medicare IM given:   Date Additional Medicare IM given:    Discharge Disposition:  HOME/SELF CARE  Per UR Regulation:  Reviewed for med. necessity/level of care/duration of stay  If discussed at [place] of Stay Meetings, dates discussed:    Comments:  11/18/13 Theresa, BSN [phone] patient for dc , no NCM referral, no needs anticipated.

## 2013-12-11 ENCOUNTER — Ambulatory Visit (HOSPITAL_BASED_OUTPATIENT_CLINIC_OR_DEPARTMENT_OTHER): Payer: Medicaid Other | Admitting: Genetic Counselor

## 2013-12-11 ENCOUNTER — Encounter: Payer: Self-pay | Admitting: Genetic Counselor

## 2013-12-11 ENCOUNTER — Other Ambulatory Visit: Payer: Medicaid Other

## 2013-12-11 ENCOUNTER — Ambulatory Visit: Payer: Medicaid Other | Admitting: Endocrinology

## 2013-12-11 ENCOUNTER — Encounter: Payer: Self-pay | Admitting: Endocrinology

## 2013-12-11 ENCOUNTER — Ambulatory Visit (INDEPENDENT_AMBULATORY_CARE_PROVIDER_SITE_OTHER): Payer: Medicaid Other | Admitting: Endocrinology

## 2013-12-11 VITALS — BP 124/78 | HR 97 | Temp 98.6°F | Ht 68.0 in | Wt 393.0 lb

## 2013-12-11 DIAGNOSIS — E119 Type 2 diabetes mellitus without complications: Secondary | ICD-10-CM

## 2013-12-11 DIAGNOSIS — Z809 Family history of malignant neoplasm, unspecified: Secondary | ICD-10-CM

## 2013-12-11 DIAGNOSIS — Z803 Family history of malignant neoplasm of breast: Secondary | ICD-10-CM

## 2013-12-11 DIAGNOSIS — Z8041 Family history of malignant neoplasm of ovary: Secondary | ICD-10-CM

## 2013-12-11 DIAGNOSIS — Z808 Family history of malignant neoplasm of other organs or systems: Secondary | ICD-10-CM

## 2013-12-11 LAB — HEMOGLOBIN A1C
HEMOGLOBIN A1C: 6.8 % — AB (ref <5.7)
Mean Plasma Glucose: 148 mg/dL — ABNORMAL HIGH (ref <117)

## 2013-12-11 NOTE — Patient Instructions (Addendum)
blood tests are being requested for you today.  We'll contact you with results.   check your blood sugar 2 times a day.  vary the time of day when you check, between before the 3 meals, and at bedtime.  also check if you have symptoms of your blood sugar being too high or too low.  please keep a record of the readings and bring it to your next appointment here.  please call us sooner if you are having low blood sugar episodes, or if it stays over 200.   Please come back for a follow-up appointment in 3 months.  The tanzeum and metformin could set off your gastroparesis symptoms, so please stop them if you get nausea.

## 2013-12-11 NOTE — Progress Notes (Signed)
Patient Name: [name] Patient Age: 43 y.o. Encounter Date: 12/11/2013  Referring Physician: Lucianne Lei, MD [address] [place], [place] [address]  Primary Care Provider: Theressa Millard, MD   [name], a 43 y.o. 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  She presents to clinic today to discuss what this means for her and to pursue site-specific genetic testing.  HISTORY OF PRESENT ILLNESS: [name] has no personal history of cancer. She states she has a yearly mammogram, clinical breast exam and gynecologic exam.   Past Medical History  Diagnosis Date  . Bell's palsy 08/09/2010  . SLE 08/09/2010  . SJOGREN'S SYNDROME 08/09/2010  . Hypertension   . High cholesterol   . Anginal pain   . Obstructive sleep apnea on CPAP 2011  . Anxiety   . GERD (gastroesophageal reflux disease)   . CHF (congestive heart failure)   . Acute renal failure      Intractable nausea vomiting secondary to diabetic gastroparesis causing dehydration and acute renal failure /notes 04/01/2013  . Liver mass     biopsied 03/2013 at [facility], not malignant.  is to undergo radiologic ablation of the mass in sept/October 2014.   Marland Kitchen Pneumonia 2000's    "once" (04/01/2013)  . On home oxygen therapy     "2L 24/7" (04/01/2013)  . Diabetic gastroparesis associated with type 2 diabetes mellitus     this is presumed diagnoses, not confirmed by any studies.   . Migraines     "maybe a couple times/yr" (04/01/2013)  . Stroke 2010; 10/2012    "left side is still weak from it, never fully regained full strength; no additions from stroke 10/2012"  . Obesity   . Family history of malignant neoplasm of breast   . Family history of malignant neoplasm of ovary     Past Surgical History  Procedure Laterality Date  . Ectopic pregnancy surgery  1999  . Cardiac  catheterization  02/07/12  . Hernia repair  [address]    umbilical  . Epidermoid cyst excision  2007     left breast  . Esophagogastroduodenoscopy N/A 02/26/2013    Procedure: ESOPHAGOGASTRODUODENOSCOPY (EGD);  Surgeon: Irene Shipper, MD;  Location: [facility] ENDOSCOPY;  Service: Endoscopy;  Laterality: N/A;  . Breast cyst excision Left 2007    epidermoid    History   Social History  . Marital Status: Single    Spouse Name: N/A    Number of Children: N/A  . Years of Education: N/A   Occupational History  .      student   Social History Main Topics  . Smoking status: Former Smoker -- 3 years    Types: Cigarettes    Quit date: 06/01/1990  . Smokeless tobacco: Never Used  . Alcohol Use: No  . Drug Use: No  . Sexual Activity: Yes    Partners: Male    Birth Control/ Protection: Inserts     Comment: Nuva-ring   Other Topics Concern  . Not on file   Social History Narrative   Regular exercise-yes     FAMILY HISTORY:   During the visit, a 4-generation pedigree was obtained. Significant diagnoses include the following:  Family History  Problem Relation Age of Onset  . Diabetes Mother   . Hypertension Other   . Stroke Other   .  Arthritis Other   . Ovarian cancer Sister 15    maternal half-sister; RAD51C positive  . Breast cancer Maternal Aunt 49    currently 39  . Stomach cancer Maternal Uncle     dx 50s; deceased  . Cancer Paternal Aunt     unk. primary; deceased 11s  . Prostate cancer Paternal Uncle     deceased 53s  . Breast cancer Cousin     deceased 58s; daughter of mat uncle with stomach ca  . Prostate cancer Maternal Uncle     deceased 9s  . Breast cancer Maternal Aunt     deceased 15s  . Cancer Maternal Aunt     unk. primary  . Cancer Maternal Aunt     unk. primary  . Ovarian cancer Maternal Aunt     deceased 15s    [name] reports that her mother is 25yo and cancer-free. Her mother had a hysterectomy, but [name] is uncertain whether her ovaries were  left intact. Her mother had 8 sisters and 3 brothers. Many had cancer, including 2 with breast cancer and 2 with unknown primary cancers. She reports several cousins have breast or ovarian cancers, but is not certain the specific relation.   In regards to genetic testing in her family, her maternal half-sister, [name], was recently found to have a pathogenic RAD51C mutation ([id number]  [id number]). Testing was through [facility]. She also had a BARD1 and MSH6 VUSs. She is not aware of any other relatives pursuing genetic testing.  [name] ancestry is African American. There is no known Jewish ancestry and no consanguinity.  ASSESSMENT AND PLAN: [name] is a 43 y.o. female with a family history of breast and ovarian cancers in several maternal relatives and a pathogenic RAD51C mutation in her sister who had ovarian cancer. We reviewed the characteristics, features and inheritance patterns of hereditary cancer syndromes and explained that she has a 50% chance of having inherited this mutation. We explained that this is one of the newer genes known to be associated with hereditary breast and ovarian cancer and specific lifetime risks are not known. We also discussed genetic testing, including other the appropriate family members to test, the process of testing, insurance coverage and implications of results.   [name] wished to pursue genetic testing and a blood sample will be sent to [facility] for analysis of the specific RAD51C mutation. We discussed the implications of a positive and negative result. Results should be available in approximately 3-4 weeks, at which point we will contact her and address implications for her as well as address genetic testing for at-risk family members, if needed.    We encouraged [name] to remain in contact with [facility] annually so that we can update the family history and inform her of any changes in cancer genetics and testing that may be of  benefit for this family. Ms.  [name] questions were answered to her satisfaction today.   Thank you for the referral and allowing [place] to share in the care of your patient.   The patient was seen for a total of 30 minutes, greater than 50% of which was spent face-to-face counseling. This patient was discussed with the referring provider who agrees with the above.

## 2013-12-11 NOTE — Progress Notes (Signed)
Subjective:    Patient ID: [name], female    DOB: [birth year], 43 y.o.   MRN: [id number]  HPI Pt returns for f/u of insulin-requiring DM (dx'ed 2011, on a routine blood test; she has mild if any neuropathy of the lower extremities; she has no known associated complications; she has never had severe hypoglycemia or DKA; she has never had pancreatitis, severe hypoglycemia, or DKA; she is too ill to undergo weight-loss surgery; she was on a high dosage of U-500; however, in early 2014, she lost weight, and her insulin requirement decreased; she was restarted on the U-500 in mid-2014.   She was recently in the hospital with n/v, due to gastroparesis.  More recently, she was started on metformin and tanzeum.  no cbg record, but states cbg's are well-controlled.  There is no trend throughout the day.  Past Medical History  Diagnosis Date  . Bell's palsy 08/09/2010  . SLE 08/09/2010  . SJOGREN'S SYNDROME 08/09/2010  . Hypertension   . High cholesterol   . Anginal pain   . Obstructive sleep apnea on CPAP 2011  . Anxiety   . GERD (gastroesophageal reflux disease)   . CHF (congestive heart failure)   . Acute renal failure      Intractable nausea vomiting secondary to diabetic gastroparesis causing dehydration and acute renal failure /notes 04/01/2013  . Liver mass     biopsied 03/2013 at [facility], not malignant.  is to undergo radiologic ablation of the mass in sept/October 2014.   Marland Kitchen Pneumonia 2000's    "once" (04/01/2013)  . On home oxygen therapy     "2L 24/7" (04/01/2013)  . Diabetic gastroparesis associated with type 2 diabetes mellitus     this is presumed diagnoses, not confirmed by any studies.   . Migraines     "maybe a couple times/yr" (04/01/2013)  . Stroke 2010; 10/2012    "left side is still weak from it, never fully regained full strength; no additions from stroke 10/2012"  . Obesity   . Family history of malignant neoplasm of breast   . Family history of malignant neoplasm of ovary      Past Surgical History  Procedure Laterality Date  . Ectopic pregnancy surgery  1999  . Cardiac catheterization  02/07/12  . Hernia repair  [address]    umbilical  . Epidermoid cyst excision  2007     left breast  . Esophagogastroduodenoscopy N/A 02/26/2013    Procedure: ESOPHAGOGASTRODUODENOSCOPY (EGD);  Surgeon: Irene Shipper, MD;  Location: [facility] ENDOSCOPY;  Service: Endoscopy;  Laterality: N/A;  . Breast cyst excision Left 2007    epidermoid    History   Social History  . Marital Status: Single    Spouse Name: N/A    Number of Children: N/A  . Years of Education: N/A   Occupational History  .      student   Social History Main Topics  . Smoking status: Former Smoker -- 3 years    Types: Cigarettes    Quit date: 06/01/1990  . Smokeless tobacco: Never Used  . Alcohol Use: No  . Drug Use: No  . Sexual Activity: Yes    Partners: Male    Birth Control/ Protection: Inserts     Comment: Nuva-ring   Other Topics Concern  . Not on file   Social History Narrative   Regular exercise-yes    Current Outpatient Prescriptions on File Prior to Visit  Medication Sig Dispense Refill  . albuterol (PROAIR  HFA) 108 (90 BASE) MCG/ACT inhaler Inhale 2 puffs into the lungs every 6 (six) hours as needed. For shortness of breath/wheeze      . alendronate (FOSAMAX) 70 MG tablet Take 70 mg by mouth every 7 (seven) days. saturday. Take with a full glass of water on an empty stomach.      . ALPRAZolam (XANAX) 0.5 MG tablet Take 0.5 mg by mouth 2 (two) times daily as needed for sleep or anxiety.       Marland Kitchen aspirin EC 81 MG tablet Take 81 mg by mouth daily.      Marland Kitchen azaTHIOprine (IMURAN) 50 MG tablet Take 100 mg by mouth 2 (two) times daily.       . carvedilol (COREG) 3.125 MG tablet Take 3.125 mg by mouth 2 (two) times daily with a meal.      . cetirizine (ZYRTEC) 10 MG tablet Take 10 mg by mouth daily.      . citalopram (CELEXA) 10 MG tablet Take 1 tablet (10 mg total) by mouth every evening.  30  tablet  0  . clopidogrel (PLAVIX) 75 MG tablet Take 75 mg by mouth daily.      . cycloSPORINE (RESTASIS) 0.05 % ophthalmic emulsion Place 1 drop into both eyes 3 (three) times daily.      Marland Kitchen EASY TOUCH INSULIN SYRINGE 30G X 1/2" 1 ML MISC USE AS DIRECTED THREE TIMES A DAY  100 each  3  . EASY TOUCH INSULIN SYRINGE 31G X 5/16" 0.3 ML MISC USE AS DIRECTED THREE TIMES A DAY  100 each  3  . erythromycin base (E-MYCIN) 500 MG tablet Take 1 tablet (500 mg total) by mouth 3 (three) times daily.  21 tablet  0  . famotidine (PEPCID) 20 MG tablet Take 20 mg by mouth 2 (two) times daily.      Marland Kitchen gabapentin (NEURONTIN) 300 MG capsule Take 300 mg by mouth 2 (two) times daily.       Marland Kitchen HYDROcodone-acetaminophen (NORCO/VICODIN) 5-325 MG per tablet Take 1 tablet by mouth every 6 (six) hours as needed for moderate pain.  30 tablet  0  . hydroxychloroquine (PLAQUENIL) 200 MG tablet Take 200 mg by mouth 2 (two) times daily.       . insulin regular human CONCENTRATED (HUMULIN R) 500 UNIT/ML SOLN injection Inject 0.02 mLs (10 Units total) into the skin 3 (three) times daily with meals. Because it is concentrated, please draw up as though it is just 10 units at a time.  20 mL  0  . isosorbide-hydrALAZINE (BIDIL) 20-37.5 MG per tablet Take 1 tablet by mouth 3 (three) times daily.      . metFORMIN (GLUCOPHAGE) 500 MG tablet Take 500 mg by mouth 2 (two) times daily with a meal.      . norethindrone (MICRONOR,CAMILA,ERRIN) 0.35 MG tablet Take 1 tablet (0.35 mg total) by mouth daily.  1 Package  11  . ondansetron (ZOFRAN) 4 MG tablet Take 1 tablet (4 mg total) by mouth every 8 (eight) hours as needed for nausea or vomiting.  20 tablet  0  . pantoprazole (PROTONIX) 40 MG tablet Take 1 tablet (40 mg total) by mouth 2 (two) times daily.  30 tablet  0  . potassium chloride SA (K-DUR,KLOR-CON) 20 MEQ tablet Take 20 mEq by mouth 2 (two) times daily.      . predniSONE (DELTASONE) 20 MG tablet Take 2 tablets (40 mg total) by mouth daily  with breakfast.  30 tablet  0  . promethazine (PHENERGAN) 25 MG tablet Take 25 mg by mouth every 6 (six) hours as needed for nausea or vomiting.      . torsemide (DEMADEX) 20 MG tablet Take 1 tablet (20 mg total) by mouth 2 (two) times daily.  30 tablet  0  . traZODone (DESYREL) 50 MG tablet Take 1 tablet (50 mg total) by mouth at bedtime.  30 tablet  0   No current facility-administered medications on file prior to visit.    Allergies  Allergen Reactions  . Oxycodone-Acetaminophen Hives    FYI: no reaction to hydrocodone; tolerates dilaudid  . Codeine Other (See Comments)    (Derm)  . Metoclopramide     Developed restless leg, akathisia type limb movements.   . Oxycodone-Acetaminophen Other (See Comments)    Family History  Problem Relation Age of Onset  . Diabetes Mother   . Hypertension Other   . Stroke Other   . Arthritis Other   . Ovarian cancer Sister 17    maternal half-sister; RAD51C positive  . Breast cancer Maternal Aunt 63    currently 90  . Stomach cancer Maternal Uncle     dx 2s; deceased  . Cancer Paternal Aunt     unk. primary; deceased 78s  . Prostate cancer Paternal Uncle     deceased 9s  . Breast cancer Cousin     deceased 52s; daughter of mat uncle with stomach ca  . Prostate cancer Maternal Uncle     deceased 28s  . Breast cancer Maternal Aunt     deceased 84s  . Cancer Maternal Aunt     unk. primary  . Cancer Maternal Aunt     unk. primary  . Ovarian cancer Maternal Aunt     deceased 80s    BP 124/78  Pulse 97  Temp(Src) 98.6 F (37 C) (Oral)  Ht 5' 8"  (1.727 m)  Wt 393 lb (178.264 kg)  BMI 59.77 kg/m2  SpO2 96%  Review of Systems She denies hypoglycemia and weight change.      Objective:   Physical Exam VITAL SIGNS:  See vs page GENERAL: no distress.   Lab Results  Component Value Date   HGBA1C 6.8* 12/11/2013      Assessment & Plan:  DM: well-controlled Gastroparesis: i am not in favor of the tanzeum and metformin, but  she wishes to continue.

## 2013-12-24 ENCOUNTER — Emergency Department (HOSPITAL_COMMUNITY): Payer: Medicaid Other

## 2013-12-24 ENCOUNTER — Emergency Department (HOSPITAL_COMMUNITY)
Admission: EM | Admit: 2013-12-24 | Discharge: 2013-12-25 | Disposition: A | Discharge status: Home / Self Care | Payer: Medicaid Other | Attending: Emergency Medicine | Admitting: Emergency Medicine

## 2013-12-24 ENCOUNTER — Other Ambulatory Visit: Payer: Self-pay

## 2013-12-24 DIAGNOSIS — F411 Generalized anxiety disorder: Secondary | ICD-10-CM | POA: Insufficient documentation

## 2013-12-24 DIAGNOSIS — Z9889 Other specified postprocedural states: Secondary | ICD-10-CM | POA: Insufficient documentation

## 2013-12-24 DIAGNOSIS — I10 Essential (primary) hypertension: Secondary | ICD-10-CM | POA: Insufficient documentation

## 2013-12-24 DIAGNOSIS — Z8673 Personal history of transient ischemic attack (TIA), and cerebral infarction without residual deficits: Secondary | ICD-10-CM | POA: Insufficient documentation

## 2013-12-24 DIAGNOSIS — Z8739 Personal history of other diseases of the musculoskeletal system and connective tissue: Secondary | ICD-10-CM | POA: Insufficient documentation

## 2013-12-24 DIAGNOSIS — I509 Heart failure, unspecified: Secondary | ICD-10-CM | POA: Insufficient documentation

## 2013-12-24 DIAGNOSIS — R42 Dizziness and giddiness: Secondary | ICD-10-CM | POA: Insufficient documentation

## 2013-12-24 DIAGNOSIS — E669 Obesity, unspecified: Secondary | ICD-10-CM | POA: Insufficient documentation

## 2013-12-24 DIAGNOSIS — Z79899 Other long term (current) drug therapy: Secondary | ICD-10-CM | POA: Insufficient documentation

## 2013-12-24 DIAGNOSIS — G43909 Migraine, unspecified, not intractable, without status migrainosus: Secondary | ICD-10-CM | POA: Insufficient documentation

## 2013-12-24 DIAGNOSIS — G4733 Obstructive sleep apnea (adult) (pediatric): Secondary | ICD-10-CM | POA: Insufficient documentation

## 2013-12-24 DIAGNOSIS — Z87891 Personal history of nicotine dependence: Secondary | ICD-10-CM | POA: Insufficient documentation

## 2013-12-24 DIAGNOSIS — K3184 Gastroparesis: Secondary | ICD-10-CM | POA: Insufficient documentation

## 2013-12-24 DIAGNOSIS — R220 Localized swelling, mass and lump, head: Secondary | ICD-10-CM

## 2013-12-24 DIAGNOSIS — E1149 Type 2 diabetes mellitus with other diabetic neurological complication: Secondary | ICD-10-CM | POA: Insufficient documentation

## 2013-12-24 DIAGNOSIS — Z8701 Personal history of pneumonia (recurrent): Secondary | ICD-10-CM | POA: Insufficient documentation

## 2013-12-24 DIAGNOSIS — R221 Localized swelling, mass and lump, neck: Principal | ICD-10-CM

## 2013-12-24 DIAGNOSIS — Z9981 Dependence on supplemental oxygen: Secondary | ICD-10-CM | POA: Insufficient documentation

## 2013-12-24 DIAGNOSIS — Z7982 Long term (current) use of aspirin: Secondary | ICD-10-CM | POA: Insufficient documentation

## 2013-12-24 DIAGNOSIS — Z87448 Personal history of other diseases of urinary system: Secondary | ICD-10-CM | POA: Insufficient documentation

## 2013-12-24 DIAGNOSIS — K219 Gastro-esophageal reflux disease without esophagitis: Secondary | ICD-10-CM | POA: Insufficient documentation

## 2013-12-24 DIAGNOSIS — Z7902 Long term (current) use of antithrombotics/antiplatelets: Secondary | ICD-10-CM | POA: Insufficient documentation

## 2013-12-24 DIAGNOSIS — R259 Unspecified abnormal involuntary movements: Secondary | ICD-10-CM | POA: Insufficient documentation

## 2013-12-24 DIAGNOSIS — Z794 Long term (current) use of insulin: Secondary | ICD-10-CM | POA: Insufficient documentation

## 2013-12-24 MED ORDER — HYDROMORPHONE HCL PF 1 MG/ML IJ SOLN
1.0000 mg | Freq: Once | INTRAMUSCULAR | Status: AC
  Administered 2013-12-25: 1 mg via INTRAVENOUS
  Filled 2013-12-24: qty 1

## 2013-12-24 MED ORDER — ONDANSETRON HCL 4 MG/2ML IJ SOLN
4.0000 mg | Freq: Once | INTRAMUSCULAR | Status: AC
  Administered 2013-12-25: 4 mg via INTRAVENOUS
  Filled 2013-12-24: qty 2

## 2013-12-24 MED ORDER — SODIUM CHLORIDE 0.9 % IV BOLUS (SEPSIS)
1000.0000 mL | Freq: Once | INTRAVENOUS | Status: AC
  Administered 2013-12-25: 1000 mL via INTRAVENOUS

## 2013-12-24 NOTE — ED Notes (Signed)
Pt had her wisdom teeth and 11 other teeth taken out today. Pt now feels dizzy and SOB. Clear lung sounds per EMS. Pt normally on 2L [place] for CHF.

## 2013-12-24 NOTE — ED Notes (Signed)
Bed: XK55 Expected date: 12/24/13 Expected time:  Means of arrival: Ambulance Comments: EMS 89yr SOB/Dizzineess

## 2013-12-25 LAB — BASIC METABOLIC PANEL
BUN: 10 mg/dL (ref 6–23)
CHLORIDE: 104 meq/L (ref 96–112)
CO2: 25 mEq/L (ref 19–32)
CREATININE: 0.79 mg/dL (ref 0.50–1.10)
Calcium: 9.2 mg/dL (ref 8.4–10.5)
GFR calc non Af Amer: >90 mL/min (ref >90)
Glucose, Bld: 121 mg/dL — ABNORMAL HIGH (ref 70–99)
Potassium: 4.5 mEq/L (ref 3.7–5.3)
SODIUM: 143 meq/L (ref 137–147)

## 2013-12-25 LAB — CBC WITH DIFFERENTIAL/PLATELET
BASOS PCT: 0 % (ref 0–1)
Basophils Absolute: 0 10*3/uL (ref 0.0–0.1)
Eosinophils Absolute: 0.1 10*3/uL (ref 0.0–0.7)
Eosinophils Relative: 1 % (ref 0–5)
HEMATOCRIT: 32.3 % — AB (ref 36.0–46.0)
Hemoglobin: 10.7 g/dL — ABNORMAL LOW (ref 12.0–15.0)
LYMPHS PCT: 22 % (ref 12–46)
Lymphs Abs: 2.6 10*3/uL (ref 0.7–4.0)
MCH: 27.2 pg (ref 26.0–34.0)
MCHC: 33.1 g/dL (ref 30.0–36.0)
MCV: 82 fL (ref 78.0–100.0)
MONO ABS: 0.9 10*3/uL (ref 0.1–1.0)
Monocytes Relative: 8 % (ref 3–12)
NEUTROS ABS: 8.1 10*3/uL — AB (ref 1.7–7.7)
NEUTROS PCT: 69 % (ref 43–77)
Platelets: 364 10*3/uL (ref 150–400)
RBC: 3.94 MIL/uL (ref 3.87–5.11)
RDW: 14.4 % (ref 11.5–15.5)
WBC: 11.7 10*3/uL — AB (ref 4.0–10.5)

## 2013-12-25 MED ORDER — PROMETHAZINE HCL 25 MG RE SUPP: 25.0000 mg | Freq: Four times a day (QID) | RECTAL | Status: DC | PRN

## 2013-12-25 MED ORDER — ONDANSETRON HCL 4 MG/2ML IJ SOLN
4.0000 mg | Freq: Once | INTRAMUSCULAR | Status: AC
  Administered 2013-12-25: 4 mg via INTRAVENOUS
  Filled 2013-12-25: qty 2

## 2013-12-25 MED ORDER — ONDANSETRON 8 MG PO TBDP: 8.0000 mg | ORAL_TABLET | Freq: Three times a day (TID) | ORAL | Status: DC | PRN

## 2013-12-25 MED ORDER — DEXAMETHASONE SODIUM PHOSPHATE 10 MG/ML IJ SOLN
10.0000 mg | Freq: Once | INTRAMUSCULAR | Status: AC
Start: 2013-12-25 — End: 2013-12-25
  Administered 2013-12-25: 10 mg via INTRAVENOUS
  Filled 2013-12-25: qty 1

## 2013-12-25 MED ORDER — FENTANYL CITRATE 0.05 MG/ML IJ SOLN
50.0000 ug | Freq: Once | INTRAMUSCULAR | Status: AC
  Administered 2013-12-25: 50 ug via INTRAVENOUS
  Filled 2013-12-25: qty 2

## 2013-12-25 NOTE — ED Provider Notes (Signed)
CSN: [id number]     Arrival date & time 12/24/13  2248 History   First MD Initiated Contact with Patient 12/24/13 2304     Chief Complaint  Patient presents with  . Shortness of Breath  . Dizziness     (Consider location/radiation/quality/duration/timing/severity/associated sxs/prior Treatment) HPI Comments: 43 y.o. female with past medical history of Sjgren syndrome, diabetes mellitus, gastroparesis, GERD, who comes in with cc of dib, dizzy. Pt had 11 teeth removed today. States that in the afternoon, she started having increased pain, with nausea. She now has dizziness. With nausea, no eemsis. Zofran ODT taken at home. Pt feels short of breath. No hx of lung dz. She has no cough. No orthopnea, PND. No hx of DVT, PE.  Patient is a 43 y.o. female presenting with shortness of breath and dizziness. The history is provided by the patient.  Shortness of Breath Associated symptoms: no abdominal pain, no chest pain, no cough, no headaches, no neck pain, no vomiting and no wheezing   Dizziness Associated symptoms: shortness of breath   Associated symptoms: no chest pain, no headaches, no nausea and no vomiting     Past Medical History  Diagnosis Date  . Bell's palsy 08/09/2010  . SLE 08/09/2010  . SJOGREN'S SYNDROME 08/09/2010  . Hypertension   . High cholesterol   . Anginal pain   . Obstructive sleep apnea on CPAP 2011  . Anxiety   . GERD (gastroesophageal reflux disease)   . CHF (congestive heart failure)   . Acute renal failure      Intractable nausea vomiting secondary to diabetic gastroparesis causing dehydration and acute renal failure /notes 04/01/2013  . Liver mass     biopsied 03/2013 at [facility], not malignant.  is to undergo radiologic ablation of the mass in sept/October 2014.   Marland Kitchen Pneumonia 2000's    "once" (04/01/2013)  . On home oxygen therapy     "2L 24/7" (04/01/2013)  . Diabetic gastroparesis associated with type 2 diabetes mellitus     this is presumed diagnoses, not  confirmed by any studies.   . Migraines     "maybe a couple times/yr" (04/01/2013)  . Stroke 2010; 10/2012    "left side is still weak from it, never fully regained full strength; no additions from stroke 10/2012"  . Obesity   . Family history of malignant neoplasm of breast   . Family history of malignant neoplasm of ovary    Past Surgical History  Procedure Laterality Date  . Ectopic pregnancy surgery  1999  . Cardiac catheterization  02/07/12  . Hernia repair  9373'S    umbilical  . Epidermoid cyst excision  2007     left breast  . Esophagogastroduodenoscopy N/A 02/26/2013    Procedure: ESOPHAGOGASTRODUODENOSCOPY (EGD);  Surgeon: Irene Shipper, MD;  Location: [facility] ENDOSCOPY;  Service: Endoscopy;  Laterality: N/A;  . Breast cyst excision Left 2007    epidermoid   Family History  Problem Relation Age of Onset  . Diabetes Mother   . Hypertension Other   . Stroke Other   . Arthritis Other   . Ovarian cancer Sister 26    maternal half-sister; RAD51C positive  . Breast cancer Maternal Aunt 45    currently 16  . Stomach cancer Maternal Uncle     dx 74s; deceased  . Cancer Paternal Aunt     unk. primary; deceased 60s  . Prostate cancer Paternal Uncle     deceased 95s  . Breast cancer Cousin  deceased 71s; daughter of mat uncle with stomach ca  . Prostate cancer Maternal Uncle     deceased 73s  . Breast cancer Maternal Aunt     deceased 24s  . Cancer Maternal Aunt     unk. primary  . Cancer Maternal Aunt     unk. primary  . Ovarian cancer Maternal Aunt     deceased 23s   History  Substance Use Topics  . Smoking status: Former Smoker -- 3 years    Types: Cigarettes    Quit date: 06/01/1990  . Smokeless tobacco: Never Used  . Alcohol Use: No   OB History   Grav Para Term Preterm Abortions TAB SAB Ect Mult Living   2 1 1  1   1  1      Review of Systems  Constitutional: Positive for activity change.  Respiratory: Positive for shortness of breath. Negative for cough,  wheezing and stridor.   Cardiovascular: Negative for chest pain.  Gastrointestinal: Negative for nausea, vomiting and abdominal pain.  Genitourinary: Negative for dysuria.  Musculoskeletal: Negative for neck pain.  Neurological: Positive for dizziness and light-headedness. Negative for headaches.  All other systems reviewed and are negative.     Allergies  Oxycodone-acetaminophen; Codeine; Metoclopramide; and Oxycodone-acetaminophen  Home Medications   Prior to Admission medications   Medication Sig Start Date End Date Taking? Authorizing Provider  Albiglutide (TANZEUM) 30 MG PEN Inject 30 mg into the skin once a week. Wednesdays   Yes Historical Provider, MD  albuterol (PROAIR HFA) 108 (90 BASE) MCG/ACT inhaler Inhale 2 puffs into the lungs every 6 (six) hours as needed. For shortness of breath/wheeze   Yes Historical Provider, MD  alendronate (FOSAMAX) 70 MG tablet Take 70 mg by mouth every 7 (seven) days. saturday. Take with a full glass of water on an empty stomach.    Historical Provider, MD  ALPRAZolam Duanne Moron) 0.5 MG tablet Take 0.5 mg by mouth 2 (two) times daily as needed for sleep or anxiety.     Historical Provider, MD  aspirin EC 81 MG tablet Take 81 mg by mouth daily.    Historical Provider, MD  azaTHIOprine (IMURAN) 50 MG tablet Take 100 mg by mouth 2 (two) times daily.     Historical Provider, MD  carvedilol (COREG) 3.125 MG tablet Take 3.125 mg by mouth 2 (two) times daily with a meal.    Historical Provider, MD  cetirizine (ZYRTEC) 10 MG tablet Take 10 mg by mouth daily.    Historical Provider, MD  citalopram (CELEXA) 10 MG tablet Take 1 tablet (10 mg total) by mouth every evening. 04/06/13   Belkys A Regalado, MD  clopidogrel (PLAVIX) 75 MG tablet Take 75 mg by mouth daily.    Historical Provider, MD  cycloSPORINE (RESTASIS) 0.05 % ophthalmic emulsion Place 1 drop into both eyes 3 (three) times daily.    Historical Provider, MD  EASY TOUCH INSULIN SYRINGE 30G X 1/2" 1 ML  MISC USE AS DIRECTED THREE TIMES A DAY 07/05/13   Renato Shin, MD  EASY [facility] INSULIN SYRINGE 31G X 5/16" 0.3 ML MISC USE AS DIRECTED THREE TIMES A DAY 07/05/13   Renato Shin, MD  erythromycin base (E-MYCIN) 500 MG tablet Take 1 tablet (500 mg total) by mouth 3 (three) times daily. 11/18/13   Belkys A Regalado, MD  famotidine (PEPCID) 20 MG tablet Take 20 mg by mouth 2 (two) times daily.    Historical Provider, MD  gabapentin (NEURONTIN) 300 MG capsule Take 300  mg by mouth 2 (two) times daily.     Historical Provider, MD  HYDROcodone-acetaminophen (NORCO/VICODIN) 5-325 MG per tablet Take 1 tablet by mouth every 6 (six) hours as needed for moderate pain. 11/18/13   Belkys A Regalado, MD  hydroxychloroquine (PLAQUENIL) 200 MG tablet Take 200 mg by mouth 2 (two) times daily.     Historical Provider, MD  insulin regular human CONCENTRATED (HUMULIN R) 500 UNIT/ML SOLN injection Inject 0.02 mLs (10 Units total) into the skin 3 (three) times daily with meals. Because it is concentrated, please draw up as though it is just 10 units at a time. 11/18/13   Belkys A Regalado, MD  isosorbide-hydrALAZINE (BIDIL) 20-37.5 MG per tablet Take 1 tablet by mouth 3 (three) times daily.    Historical Provider, MD  metFORMIN (GLUCOPHAGE) 500 MG tablet Take 500 mg by mouth 2 (two) times daily with a meal.    Historical Provider, MD  norethindrone (MICRONOR,CAMILA,ERRIN) 0.35 MG tablet Take 1 tablet (0.35 mg total) by mouth daily. 10/03/13   Lahoma Crocker, MD  ondansetron (ZOFRAN) 4 MG tablet Take 1 tablet (4 mg total) by mouth every 8 (eight) hours as needed for nausea or vomiting. 11/18/13   Belkys A Regalado, MD  pantoprazole (PROTONIX) 40 MG tablet Take 1 tablet (40 mg total) by mouth 2 (two) times daily. 11/18/13   Belkys A Regalado, MD  potassium chloride SA (K-DUR,KLOR-CON) 20 MEQ tablet Take 20 mEq by mouth 2 (two) times daily.    Historical Provider, MD  predniSONE (DELTASONE) 20 MG tablet Take 2 tablets (40 mg total)  by mouth daily with breakfast. 11/18/13   Belkys A Regalado, MD  promethazine (PHENERGAN) 25 MG tablet Take 25 mg by mouth every 6 (six) hours as needed for nausea or vomiting.    Historical Provider, MD  torsemide (DEMADEX) 20 MG tablet Take 1 tablet (20 mg total) by mouth 2 (two) times daily. 04/06/13   Belkys A Regalado, MD  traZODone (DESYREL) 50 MG tablet Take 1 tablet (50 mg total) by mouth at bedtime. 04/06/13   Belkys A Regalado, MD   BP 122/63  Pulse 88  Resp 16  SpO2 100% Physical Exam  Nursing note and vitals reviewed. Constitutional: She is oriented to person, place, and time. She appears well-developed and well-nourished.  HENT:  Head: Normocephalic and atraumatic.  Pt has some trismus. She has no active bleeding, on oral exam, there is no pooling of secretions. Pt has visible swelling of her soft tissue. No stridors, no drooling  Eyes: EOM are normal. Pupils are equal, round, and reactive to light.  Neck: Neck supple.  Cardiovascular: Normal rate, regular rhythm and normal heart sounds.   No murmur heard. Pulmonary/Chest: Effort normal. No respiratory distress.  Abdominal: Soft. She exhibits no distension. There is no tenderness. There is no rebound and no guarding.  Neurological: She is alert and oriented to person, place, and time.  Skin: Skin is warm and dry.    ED Course  Procedures (including critical care time) Labs Review Labs Reviewed  CBC WITH DIFFERENTIAL - Abnormal; Notable for the following:    WBC 11.7 (*)    Hemoglobin 10.7 (*)    HCT 32.3 (*)    Neutro Abs 8.1 (*)    All other components within normal limits  BASIC METABOLIC PANEL - Abnormal; Notable for the following:    Glucose, Bld 121 (*)    All other components within normal limits    Imaging Review Dg Chest  Port 1 View  12/25/2013   CLINICAL DATA:  Shortness of breath after wisdom tooth extraction.  EXAM: PORTABLE CHEST - 1 VIEW  COMPARISON:  Chest radiograph November 11, 2013  FINDINGS: The  cardiac silhouette remains mildly enlarged, mediastinal silhouette is nonsuspicious. Similar central pulmonary vasculature congestion without pleural effusions or focal consolidations. No pneumothorax.  Multiple EKG lines overlie the patient and may obscure subtle underlying pathology. Large body habitus, osseous structures are nonsuspicious.  IMPRESSION: Stable cardiomegaly with central pulmonary vasculature congestion.   Electronically Signed   By: Elon Alas   On: 12/25/2013 00:57     EKG Interpretation None      MDM   Final diagnoses:  Gastroparesis  Facial swelling   Pt comes in with cc of dib, nausea and emesis. She had 11 teeth pulled out today, has visible swelling of her oral mucosa, but there is no stridor, drooling or airway concerns. No dyspnea on my evaluation. Pt is dizzy - and tachycardic. Hx of DM - and gastroparesis. PT having some nausea, wants to make sure that she can control it better, given recent surgery. Pt also wants better pain control - as she has been unable to keep her meds down.     3:09 AM Pt feeling a lot better, with pain and breathing. Labs are unremarkable. She has been hydrated, she is not tachycardic anymore. Pt has had no emesis whilst in the ER. Pt comfortable going home. Will give another round of iv meds prior to discharge. Return precautions discussed.   Varney Biles, MD 12/25/13 [id number]

## 2013-12-25 NOTE — Discharge Instructions (Signed)
Take the meds provided for nausea. Make sure you get plenty of water in. Monitor sugar every 4 hours. Return to the Er if there is any trouble breathing.   Gastroparesis  Gastroparesis is also called slowed stomach emptying (delayed gastric emptying). It is a condition in which the stomach takes too long to empty its contents. It often happens in people with diabetes.  CAUSES  Gastroparesis happens when nerves to the stomach are damaged or stop working. When the nerves are damaged, the muscles of the stomach and intestines do not work normally. The movement of food is slowed or stopped. High blood glucose (sugar) causes changes in nerves and can damage the blood vessels that carry oxygen and nutrients to the nerves. RISK FACTORS  Diabetes.  Post-viral syndromes.  Eating disorders (anorexia, bulimia).  Surgery on the stomach or vagus nerve.  Gastroesophageal reflux disease (rarely).  Smooth muscle disorders (amyloidosis, scleroderma).  Metabolic disorders, including hypothyroidism.  Parkinson disease. SYMPTOMS   Heartburn.  Feeling sick to your stomach (nausea).  Vomiting of undigested food.  An early feeling of fullness when eating.  Weight loss.  Abdominal bloating.  Erratic blood glucose levels.  Lack of appetite.  Gastroesophageal reflux.  Spasms of the stomach wall. Complications can include:  Bacterial overgrowth in stomach. Food stays in the stomach and can ferment and cause bacteria to grow.  Weight loss due to difficulty digesting and absorbing nutrients.  Vomiting.  Obstruction in the stomach. Undigested food can harden and cause nausea and vomiting.  Blood glucose fluctuations caused by inconsistent food absorption. DIAGNOSIS  The diagnosis of gastroparesis is confirmed through one or more of the following tests:  Barium X-rays and scans. These tests look at how long it takes for food to move through the stomach.  Gastric manometry. This test  measures electrical and muscular activity in the stomach. A thin tube is passed down the throat into the stomach. The tube contains a wire that takes measurements of the stomach's electrical and muscular activity as it digests liquids and solid food.  Endoscopy. This procedure is done with a long, thin tube called an endoscope. It is passed through the mouth and gently down the esophagus into the stomach. This tube helps the caregiver look at the lining of the stomach to check for any abnormalities.  Ultrasonography. This can rule out gallbladder disease or pancreatitis. This test will outline and define the shape of the gallbladder and pancreas. TREATMENT   Treatments may include:  Exercise.  Medicines to control nausea and vomiting.  Medicines to stimulate stomach muscles.  Changes in what and when you eat.  Having smaller meals more often.  Eating low-fiber forms of high-fiber foods, such as eating cooked vegetables instead of raw vegetables.  Eating low-fat foods.  Consuming liquids, which are easier to digest.  In severe cases, feeding tubes and intravenous (IV) feeding may be needed. It is important to note that in most cases, treatment does not cure gastroparesis. It is usually a lasting (chronic) condition. Treatment helps you manage the underlying condition so that you can be as healthy and comfortable as possible. Other treatments  A gastric neurostimulator has been developed to assist people with gastroparesis. The battery-operated device is surgically implanted. It emits mild electrical pulses to help improve stomach emptying and to control nausea and vomiting.  The use of botulinum toxin has been shown to improve stomach emptying by decreasing the prolonged contractions of the muscle between the stomach and the small intestine (pyloric  sphincter). The benefits are temporary. SEEK MEDICAL CARE IF:   You have diabetes and you are having problems keeping your blood glucose  in goal range.  You are having nausea, vomiting, bloating, or early feelings of fullness with eating.  Your symptoms do not change with a change in diet. Document Released: 07/18/2005 Document Revised: 11/12/2012 Document Reviewed: 12/25/2008 [facility] Patient Information 2014 [place], [place].

## 2013-12-27 ENCOUNTER — Other Ambulatory Visit: Payer: Self-pay | Admitting: Endocrinology

## 2014-01-06 ENCOUNTER — Encounter: Payer: Self-pay | Admitting: Genetic Counselor

## 2014-01-06 NOTE — Progress Notes (Signed)
Referring Physician: Lucianne Lei, MD    Ms. Nordling was called today to discuss genetic test results. Please see the Genetics note from her visit on 12/11/13 for a detailed discussion of her personal and family history.  GENETIC TESTING: At the time of Ms. Dutan's visit, we recommended Ms. Rogers Blocker pursue testing for the familial RAD51C gene mutation called p.[id number]* (c.[id number]). Ms. Steinkamp test, which was performed at [facility], did not reveal this mutation previously identified in her family. We call this result a true negative result because the familial mutation was identified in Ms. Klosinski's family, and she did not inherit it. Given this negative result, Ms. Brinker's chances of developing RAD51C-related cancers are likely the same as they are in the general population.    CANCER SCREENING: This normal result is reassuring and indicates that Ms. Eckersley does not likely have an increased risk of cancer due to a mutation in one this gene. We recommended Ms. Sarchet continue to follow the cancer screening guidelines provided by her primary physician.   FAMILY MEMBERS: Even though Ms. Stoney did not inherit the familial gene mutation, others in the family may have.  It is very important all of Ms. Cocco's relatives (female and female) know of the presence of this mutation and that they may be at increased risk for cancer. Genetic testing can sort out who in your family is at risk and who is not.  Please let us know if we can help facilitate testing. Genetic counselors can be located in other cities, by visiting the website of the [employer] ([employer]ist[employer].se) and Field seismologist for a Dietitian by zip code.  Lastly, we discussed with Ms. Cifuentes that cancer genetics is a rapidly advancing field and it is possible that new genetic tests will be appropriate for her in the future. We encouraged her to remain in contact with [place] on an annual basis so we can update her personal and family  histories, and let her know of advances in cancer genetics that may benefit the family. Our contact number was provided. Ms. Gasparyan questions were answered to her satisfaction today, and she knows she is welcome to call anytime with additional questions.    Steele Berg, MS, [place] Certified Genetic Counseor phone: [phone] [email or web address]

## 2014-01-08 ENCOUNTER — Ambulatory Visit: Payer: Medicaid Other | Admitting: Obstetrics & Gynecology

## 2014-01-13 ENCOUNTER — Inpatient Hospital Stay (HOSPITAL_COMMUNITY)
Admission: EM | Admit: 2014-01-13 | Discharge: 2014-01-15 | DRG: 546 | Disposition: A | Discharge status: Home / Self Care | Payer: Medicaid Other | Attending: Internal Medicine | Admitting: Internal Medicine

## 2014-01-13 ENCOUNTER — Other Ambulatory Visit: Payer: Self-pay

## 2014-01-13 ENCOUNTER — Emergency Department (HOSPITAL_COMMUNITY): Payer: Medicaid Other

## 2014-01-13 ENCOUNTER — Encounter (HOSPITAL_COMMUNITY): Payer: Self-pay | Admitting: Emergency Medicine

## 2014-01-13 DIAGNOSIS — I10 Essential (primary) hypertension: Secondary | ICD-10-CM

## 2014-01-13 DIAGNOSIS — Z87891 Personal history of nicotine dependence: Secondary | ICD-10-CM

## 2014-01-13 DIAGNOSIS — Z6841 Body Mass Index (BMI) 40.0 and over, adult: Secondary | ICD-10-CM

## 2014-01-13 DIAGNOSIS — K2100 Gastro-esophageal reflux disease with esophagitis, without bleeding: Secondary | ICD-10-CM

## 2014-01-13 DIAGNOSIS — D649 Anemia, unspecified: Secondary | ICD-10-CM

## 2014-01-13 DIAGNOSIS — J9610 Chronic respiratory failure, unspecified whether with hypoxia or hypercapnia: Secondary | ICD-10-CM | POA: Diagnosis present

## 2014-01-13 DIAGNOSIS — M2550 Pain in unspecified joint: Secondary | ICD-10-CM | POA: Diagnosis present

## 2014-01-13 DIAGNOSIS — Z9989 Dependence on other enabling machines and devices: Secondary | ICD-10-CM

## 2014-01-13 DIAGNOSIS — R29898 Other symptoms and signs involving the musculoskeletal system: Secondary | ICD-10-CM | POA: Diagnosis present

## 2014-01-13 DIAGNOSIS — IMO0002 Reserved for concepts with insufficient information to code with codable children: Secondary | ICD-10-CM

## 2014-01-13 DIAGNOSIS — M329 Systemic lupus erythematosus, unspecified: Principal | ICD-10-CM | POA: Diagnosis present

## 2014-01-13 DIAGNOSIS — K219 Gastro-esophageal reflux disease without esophagitis: Secondary | ICD-10-CM | POA: Diagnosis present

## 2014-01-13 DIAGNOSIS — I69998 Other sequelae following unspecified cerebrovascular disease: Secondary | ICD-10-CM

## 2014-01-13 DIAGNOSIS — R52 Pain, unspecified: Secondary | ICD-10-CM | POA: Diagnosis present

## 2014-01-13 DIAGNOSIS — F3289 Other specified depressive episodes: Secondary | ICD-10-CM | POA: Diagnosis present

## 2014-01-13 DIAGNOSIS — Z803 Family history of malignant neoplasm of breast: Secondary | ICD-10-CM

## 2014-01-13 DIAGNOSIS — Z794 Long term (current) use of insulin: Secondary | ICD-10-CM

## 2014-01-13 DIAGNOSIS — E119 Type 2 diabetes mellitus without complications: Secondary | ICD-10-CM

## 2014-01-13 DIAGNOSIS — I5032 Chronic diastolic (congestive) heart failure: Secondary | ICD-10-CM

## 2014-01-13 DIAGNOSIS — Z9981 Dependence on supplemental oxygen: Secondary | ICD-10-CM

## 2014-01-13 DIAGNOSIS — Z7982 Long term (current) use of aspirin: Secondary | ICD-10-CM

## 2014-01-13 DIAGNOSIS — G4733 Obstructive sleep apnea (adult) (pediatric): Secondary | ICD-10-CM | POA: Diagnosis present

## 2014-01-13 DIAGNOSIS — Z7902 Long term (current) use of antithrombotics/antiplatelets: Secondary | ICD-10-CM

## 2014-01-13 DIAGNOSIS — E1149 Type 2 diabetes mellitus with other diabetic neurological complication: Secondary | ICD-10-CM | POA: Diagnosis present

## 2014-01-13 DIAGNOSIS — Z8041 Family history of malignant neoplasm of ovary: Secondary | ICD-10-CM

## 2014-01-13 DIAGNOSIS — F329 Major depressive disorder, single episode, unspecified: Secondary | ICD-10-CM | POA: Diagnosis present

## 2014-01-13 DIAGNOSIS — E1143 Type 2 diabetes mellitus with diabetic autonomic (poly)neuropathy: Secondary | ICD-10-CM | POA: Diagnosis present

## 2014-01-13 DIAGNOSIS — K3184 Gastroparesis: Secondary | ICD-10-CM | POA: Diagnosis present

## 2014-01-13 DIAGNOSIS — R079 Chest pain, unspecified: Secondary | ICD-10-CM | POA: Diagnosis present

## 2014-01-13 DIAGNOSIS — M3500 Sicca syndrome, unspecified: Secondary | ICD-10-CM | POA: Diagnosis present

## 2014-01-13 DIAGNOSIS — E662 Morbid (severe) obesity with alveolar hypoventilation: Secondary | ICD-10-CM | POA: Diagnosis present

## 2014-01-13 HISTORY — DX: Chest pain, unspecified: R07.9

## 2014-01-13 HISTORY — DX: Reserved for concepts with insufficient information to code with codable children: IMO0002

## 2014-01-13 HISTORY — DX: Systemic lupus erythematosus, unspecified: M32.9

## 2014-01-13 LAB — CBC WITH DIFFERENTIAL/PLATELET
BASOS ABS: 0 10*3/uL (ref 0.0–0.1)
Basophils Relative: 1 % (ref 0–1)
EOS PCT: 5 % (ref 0–5)
Eosinophils Absolute: 0.4 10*3/uL (ref 0.0–0.7)
HEMATOCRIT: 35.6 % — AB (ref 36.0–46.0)
HEMOGLOBIN: 11.3 g/dL — AB (ref 12.0–15.0)
LYMPHS ABS: 2.9 10*3/uL (ref 0.7–4.0)
Lymphocytes Relative: 41 % (ref 12–46)
MCH: 26.9 pg (ref 26.0–34.0)
MCHC: 31.7 g/dL (ref 30.0–36.0)
MCV: 84.8 fL (ref 78.0–100.0)
MONO ABS: 0.9 10*3/uL (ref 0.1–1.0)
Monocytes Relative: 13 % — ABNORMAL HIGH (ref 3–12)
Neutro Abs: 2.8 10*3/uL (ref 1.7–7.7)
Neutrophils Relative %: 40 % — ABNORMAL LOW (ref 43–77)
Platelets: 311 10*3/uL (ref 150–400)
RBC: 4.2 MIL/uL (ref 3.87–5.11)
RDW: 14.7 % (ref 11.5–15.5)
WBC: 7 10*3/uL (ref 4.0–10.5)

## 2014-01-13 LAB — COMPREHENSIVE METABOLIC PANEL
ALT: 9 U/L (ref 0–35)
AST: 16 U/L (ref 0–37)
Albumin: 3.6 g/dL (ref 3.5–5.2)
Alkaline Phosphatase: 74 U/L (ref 39–117)
BUN: 12 mg/dL (ref 6–23)
CALCIUM: 9.9 mg/dL (ref 8.4–10.5)
CO2: 26 meq/L (ref 19–32)
CREATININE: 1.11 mg/dL — AB (ref 0.50–1.10)
Chloride: 102 mEq/L (ref 96–112)
GFR, EST AFRICAN AMERICAN: 69 mL/min — AB (ref >90)
GFR, EST NON AFRICAN AMERICAN: 60 mL/min — AB (ref >90)
GLUCOSE: 61 mg/dL — AB (ref 70–99)
Potassium: 4.3 mEq/L (ref 3.7–5.3)
Sodium: 142 mEq/L (ref 137–147)
Total Bilirubin: 0.2 mg/dL — ABNORMAL LOW (ref 0.3–1.2)
Total Protein: 7.6 g/dL (ref 6.0–8.3)

## 2014-01-13 LAB — CREATININE, SERUM
Creatinine, Ser: 1.07 mg/dL (ref 0.50–1.10)
GFR calc non Af Amer: 63 mL/min — ABNORMAL LOW (ref >90)
GFR, EST AFRICAN AMERICAN: 73 mL/min — AB (ref >90)

## 2014-01-13 LAB — CBC
HCT: 33.9 % — ABNORMAL LOW (ref 36.0–46.0)
HEMOGLOBIN: 10.7 g/dL — AB (ref 12.0–15.0)
MCH: 26.8 pg (ref 26.0–34.0)
MCHC: 31.6 g/dL (ref 30.0–36.0)
MCV: 84.8 fL (ref 78.0–100.0)
Platelets: 294 10*3/uL (ref 150–400)
RBC: 4 MIL/uL (ref 3.87–5.11)
RDW: 15 % (ref 11.5–15.5)
WBC: 6.7 10*3/uL (ref 4.0–10.5)

## 2014-01-13 LAB — I-STAT TROPONIN, ED: Troponin i, poc: 0 ng/mL (ref 0.00–0.08)

## 2014-01-13 LAB — TROPONIN I: Troponin I: <0.3 ng/mL (ref <0.30)

## 2014-01-13 LAB — GLUCOSE, CAPILLARY
GLUCOSE-CAPILLARY: 97 mg/dL (ref 70–99)
GLUCOSE-CAPILLARY: 135 mg/dL — AB (ref 70–99)
Glucose-Capillary: 171 mg/dL — ABNORMAL HIGH (ref 70–99)

## 2014-01-13 LAB — PRO B NATRIURETIC PEPTIDE: Pro B Natriuretic peptide (BNP): 19.2 pg/mL (ref 0–125)

## 2014-01-13 LAB — SEDIMENTATION RATE: SED RATE: 40 mm/h — AB (ref 0–22)

## 2014-01-13 MED ORDER — MORPHINE SULFATE 4 MG/ML IJ SOLN
4.0000 mg | Freq: Once | INTRAMUSCULAR | Status: AC
  Administered 2014-01-13: 4 mg via INTRAVENOUS
  Filled 2014-01-13: qty 1

## 2014-01-13 MED ORDER — HYDROMORPHONE HCL PF 1 MG/ML IJ SOLN
1.0000 mg | Freq: Once | INTRAMUSCULAR | Status: AC
  Administered 2014-01-13: 1 mg via INTRAVENOUS
  Filled 2014-01-13: qty 1

## 2014-01-13 MED ORDER — INSULIN ASPART 100 UNIT/ML ~~LOC~~ SOLN
0.0000 [IU] | Freq: Every day | SUBCUTANEOUS | Status: DC
  Administered 2014-01-14: 5 [IU] via SUBCUTANEOUS

## 2014-01-13 MED ORDER — MORPHINE SULFATE 2 MG/ML IJ SOLN
2.0000 mg | INTRAMUSCULAR | Status: DC | PRN
  Filled 2014-01-13 (×2): qty 1

## 2014-01-13 MED ORDER — POTASSIUM CHLORIDE CRYS ER 20 MEQ PO TBCR
20.0000 meq | EXTENDED_RELEASE_TABLET | Freq: Two times a day (BID) | ORAL | Status: DC
  Administered 2014-01-13 – 2014-01-15 (×5): 20 meq via ORAL
  Filled 2014-01-13 (×5): qty 1

## 2014-01-13 MED ORDER — ENOXAPARIN SODIUM 40 MG/0.4ML ~~LOC~~ SOLN
40.0000 mg | SUBCUTANEOUS | Status: DC
  Administered 2014-01-13: 40 mg via SUBCUTANEOUS
  Filled 2014-01-13: qty 0.4

## 2014-01-13 MED ORDER — INSULIN DETEMIR 100 UNIT/ML ~~LOC~~ SOLN
5.0000 [IU] | Freq: Every day | SUBCUTANEOUS | Status: DC
  Administered 2014-01-13: 5 [IU] via SUBCUTANEOUS
  Filled 2014-01-13 (×2): qty 0.05

## 2014-01-13 MED ORDER — TRAZODONE HCL 50 MG PO TABS
50.0000 mg | ORAL_TABLET | Freq: Every day | ORAL | Status: DC
  Administered 2014-01-13 – 2014-01-14 (×2): 50 mg via ORAL
  Filled 2014-01-13 (×3): qty 1

## 2014-01-13 MED ORDER — ONDANSETRON HCL 4 MG/2ML IJ SOLN
4.0000 mg | Freq: Four times a day (QID) | INTRAMUSCULAR | Status: DC | PRN
  Administered 2014-01-13: 4 mg via INTRAVENOUS
  Filled 2014-01-13: qty 2

## 2014-01-13 MED ORDER — TORSEMIDE 20 MG PO TABS
20.0000 mg | ORAL_TABLET | Freq: Two times a day (BID) | ORAL | Status: DC
  Administered 2014-01-13 – 2014-01-15 (×4): 20 mg via ORAL
  Filled 2014-01-13 (×6): qty 1

## 2014-01-13 MED ORDER — PREDNISONE 5 MG PO TABS
5.0000 mg | ORAL_TABLET | Freq: Every day | ORAL | Status: DC
  Filled 2014-01-13: qty 1

## 2014-01-13 MED ORDER — ACETAMINOPHEN 325 MG PO TABS: 650.0000 mg | ORAL_TABLET | ORAL | Status: DC | PRN

## 2014-01-13 MED ORDER — AZATHIOPRINE 50 MG PO TABS
100.0000 mg | ORAL_TABLET | Freq: Two times a day (BID) | ORAL | Status: DC
  Administered 2014-01-13 – 2014-01-15 (×5): 100 mg via ORAL
  Filled 2014-01-13 (×6): qty 2

## 2014-01-13 MED ORDER — NORETHINDRONE 0.35 MG PO TABS: 1.0000 | ORAL_TABLET | Freq: Every day | ORAL | Status: DC

## 2014-01-13 MED ORDER — MORPHINE SULFATE 2 MG/ML IJ SOLN
1.0000 mg | INTRAMUSCULAR | Status: DC | PRN
  Administered 2014-01-13 – 2014-01-15 (×9): 1 mg via INTRAVENOUS
  Filled 2014-01-13 (×8): qty 1

## 2014-01-13 MED ORDER — LORATADINE 10 MG PO TABS
10.0000 mg | ORAL_TABLET | Freq: Every day | ORAL | Status: DC
  Administered 2014-01-13 – 2014-01-15 (×3): 10 mg via ORAL
  Filled 2014-01-13 (×3): qty 1

## 2014-01-13 MED ORDER — CITALOPRAM HYDROBROMIDE 10 MG PO TABS
10.0000 mg | ORAL_TABLET | Freq: Every evening | ORAL | Status: DC
  Administered 2014-01-13 – 2014-01-14 (×2): 10 mg via ORAL
  Filled 2014-01-13 (×3): qty 1

## 2014-01-13 MED ORDER — ASPIRIN EC 81 MG PO TBEC
81.0000 mg | DELAYED_RELEASE_TABLET | Freq: Every day | ORAL | Status: DC
  Administered 2014-01-13 – 2014-01-15 (×3): 81 mg via ORAL
  Filled 2014-01-13 (×3): qty 1

## 2014-01-13 MED ORDER — METHYLPREDNISOLONE SODIUM SUCC 40 MG IJ SOLR
40.0000 mg | Freq: Two times a day (BID) | INTRAMUSCULAR | Status: DC
  Administered 2014-01-13: 40 mg via INTRAVENOUS
  Filled 2014-01-13 (×4): qty 1

## 2014-01-13 MED ORDER — CARVEDILOL 3.125 MG PO TABS
3.1250 mg | ORAL_TABLET | Freq: Two times a day (BID) | ORAL | Status: DC
  Administered 2014-01-13 – 2014-01-15 (×4): 3.125 mg via ORAL
  Filled 2014-01-13 (×6): qty 1

## 2014-01-13 MED ORDER — FAMOTIDINE 20 MG PO TABS
20.0000 mg | ORAL_TABLET | Freq: Two times a day (BID) | ORAL | Status: DC
  Administered 2014-01-13 – 2014-01-15 (×5): 20 mg via ORAL
  Filled 2014-01-13 (×6): qty 1

## 2014-01-13 MED ORDER — ALPRAZOLAM 0.5 MG PO TABS
0.5000 mg | ORAL_TABLET | Freq: Two times a day (BID) | ORAL | Status: DC | PRN
  Administered 2014-01-15: 0.5 mg via ORAL
  Filled 2014-01-13: qty 1

## 2014-01-13 MED ORDER — GABAPENTIN 300 MG PO CAPS
300.0000 mg | ORAL_CAPSULE | Freq: Two times a day (BID) | ORAL | Status: DC
  Administered 2014-01-13 – 2014-01-15 (×5): 300 mg via ORAL
  Filled 2014-01-13 (×6): qty 1

## 2014-01-13 MED ORDER — CYCLOSPORINE 0.05 % OP EMUL
1.0000 [drp] | Freq: Three times a day (TID) | OPHTHALMIC | Status: DC
  Administered 2014-01-13 – 2014-01-15 (×4): 1 [drp] via OPHTHALMIC
  Filled 2014-01-13 (×8): qty 1

## 2014-01-13 MED ORDER — INSULIN ASPART 100 UNIT/ML ~~LOC~~ SOLN
0.0000 [IU] | Freq: Three times a day (TID) | SUBCUTANEOUS | Status: DC
  Administered 2014-01-13: 1 [IU] via SUBCUTANEOUS
  Administered 2014-01-14: 3 [IU] via SUBCUTANEOUS
  Administered 2014-01-14: 5 [IU] via SUBCUTANEOUS
  Administered 2014-01-14: 3 [IU] via SUBCUTANEOUS
  Administered 2014-01-15: 7 [IU] via SUBCUTANEOUS

## 2014-01-13 MED ORDER — HYDROXYCHLOROQUINE SULFATE 200 MG PO TABS
200.0000 mg | ORAL_TABLET | Freq: Two times a day (BID) | ORAL | Status: DC
  Administered 2014-01-13 – 2014-01-15 (×5): 200 mg via ORAL
  Filled 2014-01-13 (×7): qty 1

## 2014-01-13 MED ORDER — ISOSORB DINITRATE-HYDRALAZINE 20-37.5 MG PO TABS
1.0000 | ORAL_TABLET | Freq: Three times a day (TID) | ORAL | Status: DC
  Administered 2014-01-13 – 2014-01-15 (×6): 1 via ORAL
  Filled 2014-01-13 (×8): qty 1

## 2014-01-13 MED ORDER — CLOPIDOGREL BISULFATE 75 MG PO TABS
75.0000 mg | ORAL_TABLET | Freq: Every day | ORAL | Status: DC
  Administered 2014-01-13 – 2014-01-15 (×3): 75 mg via ORAL
  Filled 2014-01-13 (×3): qty 1

## 2014-01-13 MED ORDER — ONDANSETRON 8 MG PO TBDP
8.0000 mg | ORAL_TABLET | Freq: Three times a day (TID) | ORAL | Status: DC | PRN
  Filled 2014-01-13: qty 1

## 2014-01-13 MED ORDER — HYDROCODONE-ACETAMINOPHEN 5-325 MG PO TABS
1.0000 | ORAL_TABLET | Freq: Four times a day (QID) | ORAL | Status: DC | PRN
  Administered 2014-01-13 – 2014-01-15 (×2): 1 via ORAL
  Filled 2014-01-13 (×3): qty 1

## 2014-01-13 MED ORDER — ENOXAPARIN SODIUM 100 MG/ML ~~LOC~~ SOLN
85.0000 mg | SUBCUTANEOUS | Status: DC
  Administered 2014-01-14: 85 mg via SUBCUTANEOUS
  Filled 2014-01-13 (×2): qty 1

## 2014-01-13 NOTE — ED Notes (Signed)
Patient presents stating that she has Lupus and has been hurting.  Chest tightness started on Thursday but got worse tonight.  Unable to lay down to sleep due to the pain

## 2014-01-13 NOTE — Progress Notes (Signed)
Report given to receiving RN. Patient in bed sleeping. No signs or symptoms of distress or discomfort noted.  

## 2014-01-13 NOTE — ED Notes (Signed)
Verified with main lab that all blood samples have been collected.

## 2014-01-13 NOTE — ED Notes (Signed)
Called mini-lab about lab draw.

## 2014-01-13 NOTE — H&P (Signed)
[facility] History and Physical  [name] RAQ:[id number] DOB: 10-23-1970 DOA: 01/13/2014  Referring physician: Dr. Lemar [name], ER physician PCP: [name] Peers, MD   Chief Complaint: Chest pain  HPI: [name] is a 43 y.o. female  Past oral history of diabetes, morbid obesity and lupus who for the last several days has been complaining of diffuse pain, hurting all over. Is also hurting midsternally in her chest and she felt for short of breath when leaning back. She became concerned and came into the emergency room. Chest x-ray noted some pulmonary overload, however BNP was normal. All of her labs are normal, with the exception of a sed rate which was mildly elevated at 40. Cardiac markers and troponin were normal. Hospitals were called for further evaluation and admission.   Review of Systems:  Patient seen after arrival to floor. Doing okay. Her biggest complaint is of hurting all over, not just in her joints but also her muscles. Complains of a mild headache. Denies any vision changes or dysphagia. He complains of chest pain midsternal, but also across her chest. She does state that she feels mildly short of breath. Denies any coughing or wheezing. Does complain of abdominal and back pain. Denies any hematuria, dysuria or diarrhea. She problem with chronic constipation. Complains of acute pain in both her legs and arms, not just in the joints. Mild nauseated. Review of systems otherwise negative.  Past Medical History  Diagnosis Date  . Bell's palsy 08/09/2010  . SLE 08/09/2010  . SJOGREN'S SYNDROME 08/09/2010  . Hypertension   . High cholesterol   . Anginal  pain   . Obstructive sleep apnea on CPAP 2011  . Anxiety   . GERD (gastroesophageal reflux disease)   . CHF (congestive heart failure)   . Acute renal failure      Intractable nausea vomiting secondary to diabetic gastroparesis causing dehydration and acute renal failure /notes 04/01/2013  . Liver mass     biopsied 03/2013 at [facility], not malignant.  is to undergo radiologic ablation of the mass in sept/October 2014.   Marland Kitchen Pneumonia 2000's    "once" (04/01/2013)  . On home oxygen therapy     "2L 24/7" (04/01/2013)  . Migraines     "maybe a couple times/yr" (04/01/2013)  . Stroke 2010; 10/2012    "left side is still weak from it, never fully regained full strength; no additions from stroke 10/2012"  . Obesity   . Family history of malignant neoplasm of breast   . Family history of malignant neoplasm of ovary   . Lupus   . Chest pain 01/13/2014  . Diabetic gastroparesis associated with type 2 diabetes mellitus     this is presumed diagnoses, not confirmed by any studies.   . Shortness of breath    Past Surgical History  Procedure Laterality Date  . Ectopic pregnancy surgery  1999  . Cardiac catheterization  02/07/12  . Hernia repair  4562'B    umbilical  . Epidermoid cyst excision  2007     left breast  . Esophagogastroduodenoscopy N/A 02/26/2013    Procedure: ESOPHAGOGASTRODUODENOSCOPY (EGD);  Surgeon: [name] Shipper, MD;  Location: [facility] ENDOSCOPY;  Service: Endoscopy;  Laterality: N/A;  . Breast cyst excision Left 2007    epidermoid   Social History:  reports that she quit smoking about 23 years ago. Her smoking use included Cigarettes. She smoked 0.00 packs per day for 3 years. She has never used smokeless tobacco. She reports that she  does not drink alcohol or use illicit drugs. patient lives at home by herself. She states that she is normally able to get around relatively well without use of support  Allergies  Allergen Reactions  . Oxycodone-Acetaminophen Hives    FYI: no reaction  to hydrocodone; tolerates dilaudid  . Codeine Other (See Comments)    (Derm)  . Metoclopramide     Developed restless leg, akathisia type limb movements.   . Oxycodone-Acetaminophen Other (See Comments)    Family History  Problem Relation Age of Onset  . Diabetes Mother   . Hypertension Other   . Stroke Other   . Arthritis Other   . Ovarian cancer Sister 55    maternal half-sister; RAD51C positive  . Breast cancer Maternal Aunt 80    currently 30  . Stomach cancer Maternal Uncle     dx 41s; deceased  . Cancer Paternal Aunt     unk. primary; deceased 20s  . Prostate cancer Paternal Uncle     deceased 53s  . Breast cancer Cousin     deceased 41s; daughter of mat uncle with stomach ca  . Prostate cancer Maternal Uncle     deceased 67s  . Breast cancer Maternal Aunt     deceased 47s  . Cancer Maternal Aunt     unk. primary  . Cancer Maternal Aunt     unk. primary  . Ovarian cancer Maternal Aunt     deceased 35s     Prior to Admission medications   Medication Sig Start Date End Date Taking? Authorizing Provider  Albiglutide (TANZEUM) 30 MG PEN Inject 30 mg into the skin once a week. Wednesdays   Yes Historical Provider, MD  albuterol (PROAIR HFA) 108 (90 BASE) MCG/ACT inhaler Inhale 2 puffs into the lungs every 6 (six) hours as needed. For shortness of breath/wheeze   Yes Historical Provider, MD  alendronate (FOSAMAX) 70 MG tablet Take 70 mg by mouth every 7 (seven) days. saturday. Take with a full glass of water on an empty stomach.   Yes Historical Provider, MD  aspirin EC 81 MG tablet Take 81 mg by mouth daily.   Yes Historical Provider, MD  azaTHIOprine (IMURAN) 50 MG tablet Take 100 mg by mouth 2 (two) times daily.    Yes Historical Provider, MD  carvedilol (COREG) 3.125 MG tablet Take 3.125 mg by mouth 2 (two) times daily with a meal.   Yes Historical Provider, MD  cetirizine (ZYRTEC) 10 MG tablet Take 10 mg by mouth daily.   Yes Historical Provider, MD  citalopram  (CELEXA) 10 MG tablet Take 1 tablet (10 mg total) by mouth every evening. 04/06/13  Yes [name] A Regalado, MD  clopidogrel (PLAVIX) 75 MG tablet Take 75 mg by mouth daily.   Yes Historical Provider, MD  cycloSPORINE (RESTASIS) 0.05 % ophthalmic emulsion Place 1 drop into both eyes 3 (three) times daily.   Yes Historical Provider, MD  famotidine (PEPCID) 20 MG tablet Take 20 mg by mouth 2 (two) times daily.   Yes Historical Provider, MD  gabapentin (NEURONTIN) 300 MG capsule Take 300 mg by mouth 2 (two) times daily.    Yes Historical Provider, MD  HYDROcodone-acetaminophen (NORCO/VICODIN) 5-325 MG per tablet Take 1 tablet by mouth every 6 (six) hours as needed for moderate pain. 11/18/13  Yes [name] A Regalado, MD  hydroxychloroquine (PLAQUENIL) 200 MG tablet Take 200 mg by mouth 2 (two) times daily.    Yes Historical Provider, MD  insulin regular human  CONCENTRATED (HUMULIN R) 500 UNIT/ML SOLN injection Inject 0.02 mLs (10 Units total) into the skin 3 (three) times daily with meals. Because it is concentrated, please draw up as though it is just 10 units at a time. 11/18/13  Yes [name] A Regalado, MD  isosorbide-hydrALAZINE (BIDIL) 20-37.5 MG per tablet Take 1 tablet by mouth 3 (three) times daily.   Yes Historical Provider, MD  metFORMIN (GLUCOPHAGE) 500 MG tablet Take 500 mg by mouth 2 (two) times daily with a meal.   Yes Historical Provider, MD  ondansetron (ZOFRAN ODT) 8 MG disintegrating tablet Take 1 tablet (8 mg total) by mouth every 8 (eight) hours as needed for nausea. 12/25/13  Yes Varney Biles, MD  potassium chloride SA (K-DUR,KLOR-CON) 20 MEQ tablet Take 20 mEq by mouth 2 (two) times daily.   Yes Historical Provider, MD  predniSONE (DELTASONE) 5 MG tablet Take 5 mg by mouth daily with breakfast.   Yes Historical Provider, MD  torsemide (DEMADEX) 20 MG tablet Take 1 tablet (20 mg total) by mouth 2 (two) times daily. 04/06/13  Yes [name] A Regalado, MD  traZODone (DESYREL) 50 MG tablet Take 1  tablet (50 mg total) by mouth at bedtime. 04/06/13  Yes [name] A Regalado, MD  ALPRAZolam (XANAX) 0.5 MG tablet Take 0.5 mg by mouth 2 (two) times daily as needed for sleep or anxiety.     Historical Provider, MD  norethindrone (MICRONOR,CAMILA,ERRIN) 0.35 MG tablet Take 1 tablet (0.35 mg total) by mouth daily. 10/03/13   Lahoma Crocker, MD   Physical Exam: Filed Vitals:   01/13/14 1758  BP: 121/53  Pulse: 97  Temp:   Resp:     BP 121/53  Pulse 97  Temp(Src) 98.4 F (36.9 C) (Oral)  Resp 17  Ht 5' 8"  (1.727 m)  Wt 172.6 kg (380 lb 8.2 oz)  BMI 57.87 kg/m2  SpO2 99%  LMP 11/13/2013  General:  Appears fatigued, mild distress secondary to generalized nonspecific pain Eyes: Sclera nonicteric, extraocular movements are intact ENT: Normocephalic, atraumatic, mucous membranes are moist Neck: No JVD, neck is thick Cardiovascular: Regular rate and rhythm, F8-H8, soft 2/6 systolic ejection murmur Respiratory: Clear auscultation bilaterally Abdomen: Soft, obese, nondistended, hypoactive bowel sounds Skin: No rash, skin breaks, tears or lesions Musculoskeletal: No clubbing or cyanosis or pitting edema Psychiatric: Patient is appropriate, flat affect Neurologic: No focal deficits           Labs on Admission:  Basic Metabolic Panel:  Recent Labs Lab 01/13/14 0240 01/13/14 1150  NA 142  --   K 4.3  --   CL 102  --   CO2 26  --   GLUCOSE 61*  --   BUN 12  --   CREATININE 1.11* 1.07  CALCIUM 9.9  --    Liver Function Tests:  Recent Labs Lab 01/13/14 0240  AST 16  ALT 9  ALKPHOS 74  BILITOT 0.2*  PROT 7.6  ALBUMIN 3.6   No results found for this basename: LIPASE, AMYLASE,  in the last 168 hours No results found for this basename: AMMONIA,  in the last 168 hours CBC:  Recent Labs Lab 01/13/14 0240 01/13/14 1150  WBC 7.0 6.7  NEUTROABS 2.8  --   HGB 11.3* 10.7*  HCT 35.6* 33.9*  MCV 84.8 84.8  PLT 311 294   Cardiac Enzymes:  Recent Labs Lab  01/13/14 1150 01/13/14 1405 01/13/14 1707  TROPONINI <0.30 <0.30 <0.30    BNP (last 3 results)  Recent  Labs  04/01/13 1228 04/18/13 2235 01/13/14 0312  PROBNP 29.6 231.5* 19.2   CBG:  Recent Labs Lab 01/13/14 1139 01/13/14 1620  GLUCAP 97 135*    Radiological Exams on Admission: Dg Chest Port 1 View  01/13/2014   CLINICAL DATA:  Chest pain, now worsening.  EXAM: PORTABLE CHEST - 1 VIEW  COMPARISON:  Chest radiograph April 26, 2014.  FINDINGS: The cardiac silhouette appears moderately enlarged, which may be accentuated by portable technique. Mild central pulmonary vasculature congestion is similar. No pleural effusions or focal consolidations. No pneumothorax.  Multiple EKG lines overlie the patient and may obscure subtle underlying pathology. Soft tissue planes and included osseous structures are nonsuspicious.  IMPRESSION: Stable cardiomegaly and central pulmonary vasculature congestion.   Electronically Signed   By: Elon Alas   On: 01/13/2014 03:49    EKG: Independently reviewed. Reviewed, sinus rhythm unremarkable  Assessment/Plan Principal Problem:   Diffuse pain: Not convinced this is a lupus flare up. Sedimentation rate only mildly elevated. Checking CRP. Everything else appears relatively normal. Check CPK to rule out rhabdomyolysis. Have started some IV steroids and would taper this down quickly. PT consult. Patient seems to be focused on getting Dilaudid, although she states that morphine makes her nauseated. I do not think that she is drug-seeking. Nevertheless, use anti-inflammatories Active Problems:   SLE: See above.   Chest pain: Doubt very much this is cardiac. Keep on telemetry. Two out of three troponins so far negative.   Morbid obesity: Patient meets criteria with BMI of 57   Diabetic gastroparesis: Stable for now.  History of diastolic heart: BNP normal  Diabetes mellitus: Sliding scale plus Levemir. Expect higher blood sugars given  Solu-Medrol   Code Status: Full code Family Communication: No family present Disposition Plan: Likely tomorrow  Time spent: 35 minutes  [name] [facility] Pager [phone]  **Disclaimer: This note may have been dictated with voice recognition software. Similar sounding words can inadvertently be transcribed and this note may contain transcription errors which may not have been corrected upon publication of note.**

## 2014-01-13 NOTE — ED Notes (Signed)
Discussed with Dr. Cheri Guppy that patient is currently reporting generalized pain at 31/10, and MD acknowledges, discussing that pain is not cardiac related.

## 2014-01-13 NOTE — ED Notes (Signed)
Called lab to add on sedimentation rate.  Lab will be added on to lab samples available there.

## 2014-01-13 NOTE — ED Notes (Signed)
Reported unrelieved pain to Dr. Cheri Guppy, she is now at the bedside to se patient.

## 2014-01-13 NOTE — ED Notes (Signed)
Called lab and spoke to [place] about BNP added.  They will run test.

## 2014-01-13 NOTE — ED Notes (Signed)
Attempted to call report

## 2014-01-13 NOTE — Progress Notes (Signed)
01/13/14  Pharmacy 1245  Pt with order for Norethindrone from [facility] but with note from MR tech that she has not started taking this yet.  I called pt and verified that this is a new medication that she has never taken yet, and per this discussion she has not decided if she was going to take it.  This is for birth control and we do not stock it.  Per discussion with pt, this has been d/c'd.  Please continue on discharge.  Gracy Bruins, PharmD Clinical Pharmacist [facility]

## 2014-01-14 DIAGNOSIS — D649 Anemia, unspecified: Secondary | ICD-10-CM

## 2014-01-14 DIAGNOSIS — E119 Type 2 diabetes mellitus without complications: Secondary | ICD-10-CM

## 2014-01-14 DIAGNOSIS — M2550 Pain in unspecified joint: Secondary | ICD-10-CM

## 2014-01-14 DIAGNOSIS — R079 Chest pain, unspecified: Secondary | ICD-10-CM

## 2014-01-14 LAB — GLUCOSE, CAPILLARY
Glucose-Capillary: 232 mg/dL — ABNORMAL HIGH (ref 70–99)
Glucose-Capillary: 228 mg/dL — ABNORMAL HIGH (ref 70–99)
Glucose-Capillary: 280 mg/dL — ABNORMAL HIGH (ref 70–99)
Glucose-Capillary: 354 mg/dL — ABNORMAL HIGH (ref 70–99)

## 2014-01-14 LAB — C-REACTIVE PROTEIN: CRP: <0.5 mg/dL — ABNORMAL LOW (ref <0.60)

## 2014-01-14 LAB — CK: Total CK: 153 U/L (ref 7–177)

## 2014-01-14 MED ORDER — METHYLPREDNISOLONE SODIUM SUCC 125 MG IJ SOLR
80.0000 mg | Freq: Four times a day (QID) | INTRAMUSCULAR | Status: DC
  Administered 2014-01-14 – 2014-01-15 (×4): 80 mg via INTRAVENOUS
  Filled 2014-01-14 (×9): qty 1.28

## 2014-01-14 MED ORDER — BIOTENE DRY MOUTH MT LIQD
15.0000 mL | Freq: Two times a day (BID) | OROMUCOSAL | Status: DC
  Administered 2014-01-14 – 2014-01-15 (×3): 15 mL via OROMUCOSAL

## 2014-01-14 MED ORDER — INSULIN DETEMIR 100 UNIT/ML ~~LOC~~ SOLN
20.0000 [IU] | Freq: Two times a day (BID) | SUBCUTANEOUS | Status: DC
  Administered 2014-01-14 – 2014-01-15 (×2): 20 [IU] via SUBCUTANEOUS
  Filled 2014-01-14 (×3): qty 0.2

## 2014-01-14 NOTE — Care Management Note (Signed)
    Page 1 of 1   01/14/2014     2:43:26 PM CARE MANAGEMENT NOTE 01/14/2014  Patient:  [facility]   Account Number:  [id number]  Date Initiated:  01/14/2014  Documentation initiated by:  [facility]  Subjective/Objective Assessment:   43 y.o. female  PMHx of DM, MO and lupus who for the last several days has been complaining of diffuse pain, hurting all over. Is also hurting midsternally in her chest and she felt for SOB while leaning back.// Home alone.     Action/Plan:   Checking CRP. Check CPK to rule out rhabdomyolysis. IV steroids. PT consult.// Access for [facility] services.   Anticipated DC Date:  01/14/2014   Anticipated DC Plan:  [place]  CM consult      [facility] Choice  HOME HEALTH   Choice offered to / List presented to:  C-1 Patient        [place] arranged  HH-1 RN  [place].   Status of service:  Completed, signed off Medicare Important Message given?   (If response is "NO", the following Medicare IM given date fields will be blank) Date Medicare IM given:   Date Additional Medicare IM given:    Discharge Disposition:    Per UR Regulation:    If discussed at Long Length of Stay Meetings, dates discussed:    Comments:  Camellia J. Clydene Laming, RN, BSN, [employer] [phone] Spoke with pt at bedside regarding discharge planning for [facility]. Offered pt list of home health agencies to choose from.  Pt can only receive [place] RN due to having only Medicaid for insurance. Pt chose [facility] to render services. Janae Sauce, RN of [facility] notified. No DME needs identified at this time.

## 2014-01-14 NOTE — Progress Notes (Addendum)
[facility] PROGRESS NOTE  Assessment/Plan: SLE flare/  *Diffuse pain: - ESR high, increase steroids, change to oral in am. - She relates pain is improving, her niece is coming tmrw to stay with her. - has remained afebrile. - she is not requesting additional narcotics. - home in am, get PT  Chest pain:  Doubt very much this is cardiac. Keep on telemetry.  - negative cardiac markers.  Morbid obesity: Patient meets criteria with BMI of 57   Diabetic gastroparesis:  - Stable for now.   History of diastolic heart: BNP normal   Diabetes mellitus:  Sliding scale plus Levemir. Expect higher blood sugars given Solu-Medrol      Code Status: full Family Communication: none  Disposition Plan: inpatinet   Consultants:  none  Procedures:  none  Antibiotics:  None  HPI/Subjective: Pain in joint has improved.  Objective: Filed Vitals:   01/13/14 1758 01/14/14 0138 01/14/14 0506 01/14/14 0923  BP: 121/53 129/55 125/84 151/82  Pulse: 97 94 92 92  Temp:  98 F (36.7 C) 97.9 F (36.6 C)   TempSrc:  Oral Oral   Resp:  18 18   Height:      Weight:   172.594 kg (380 lb 8 oz)   SpO2:  100% 98%     Intake/Output Summary (Last 24 hours) at 01/14/14 1016 Last data filed at 01/14/14 5277  Gross per 24 hour  Intake   1340 ml  Output    300 ml  Net   1040 ml   Filed Weights   01/13/14 0223 01/13/14 0716 01/14/14 0506  Weight: 172.367 kg (380 lb) 172.6 kg (380 lb 8.2 oz) 172.594 kg (380 lb 8 oz)    Exam:  General: Alert, awake, oriented x3, in no acute distress.  HEENT: No bruits, no goiter.  Heart: Regular rate and rhythm, without murmurs, rubs, gallops.  Lungs: Good air movement, clear Abdomen: Soft, nontender, nondistended, positive bowel sounds.    Data Reviewed: Basic Metabolic Panel:  Recent Labs Lab 01/13/14 0240 01/13/14 1150  NA 142  --   K 4.3  --   CL 102  --   CO2 26  --   GLUCOSE 61*  --   BUN 12  --   CREATININE 1.11* 1.07    CALCIUM 9.9  --    Liver Function Tests:  Recent Labs Lab 01/13/14 0240  AST 16  ALT 9  ALKPHOS 74  BILITOT 0.2*  PROT 7.6  ALBUMIN 3.6   No results found for this basename: LIPASE, AMYLASE,  in the last 168 hours No results found for this basename: AMMONIA,  in the last 168 hours CBC:  Recent Labs Lab 01/13/14 0240 01/13/14 1150  WBC 7.0 6.7  NEUTROABS 2.8  --   HGB 11.3* 10.7*  HCT 35.6* 33.9*  MCV 84.8 84.8  PLT 311 294   Cardiac Enzymes:  Recent Labs Lab 01/13/14 1150 01/13/14 1405 01/13/14 1707 01/14/14 0035  CKTOTAL  --   --   --  153  TROPONINI <0.30 <0.30 <0.30  --    BNP (last 3 results)  Recent Labs  04/01/13 1228 04/18/13 2235 01/13/14 0312  PROBNP 29.6 231.5* 19.2   CBG:  Recent Labs Lab 01/13/14 1139 01/13/14 1620 01/13/14 2152 01/14/14 0535  GLUCAP 97 135* 171* 232*    No results found for this or any previous visit (from the past 240 hour(s)).   Studies: Dg Chest Port 1 View  01/13/2014  CLINICAL DATA:  Chest pain, now worsening.  EXAM: PORTABLE CHEST - 1 VIEW  COMPARISON:  Chest radiograph April 26, 2014.  FINDINGS: The cardiac silhouette appears moderately enlarged, which may be accentuated by portable technique. Mild central pulmonary vasculature congestion is similar. No pleural effusions or focal consolidations. No pneumothorax.  Multiple EKG lines overlie the patient and may obscure subtle underlying pathology. Soft tissue planes and included osseous structures are nonsuspicious.  IMPRESSION: Stable cardiomegaly and central pulmonary vasculature congestion.   Electronically Signed   By: Elon Alas   On: 01/13/2014 03:49    Scheduled Meds: . antiseptic oral rinse  15 mL Mouth Rinse BID  . aspirin EC  81 mg Oral Daily  . azaTHIOprine  100 mg Oral BID  . carvedilol  3.125 mg Oral BID WC  . citalopram  10 mg Oral QPM  . clopidogrel  75 mg Oral Daily  . cycloSPORINE  1 drop Both Eyes TID  . enoxaparin (LOVENOX)  injection  85 mg Subcutaneous Q24H  . famotidine  20 mg Oral BID  . gabapentin  300 mg Oral BID  . hydroxychloroquine  200 mg Oral BID  . insulin aspart  0-5 Units Subcutaneous QHS  . insulin aspart  0-9 Units Subcutaneous TID WC  . insulin detemir  5 Units Subcutaneous QHS  . isosorbide-hydrALAZINE  1 tablet Oral TID  . loratadine  10 mg Oral Daily  . methylPREDNISolone (SOLU-MEDROL) injection  80 mg Intravenous Q6H  . potassium chloride SA  20 mEq Oral BID  . torsemide  20 mg Oral BID  . traZODone  50 mg Oral QHS   Continuous Infusions:    Charlynne Cousins  [facility] Pager [phone]. If 8PM-8AM, please contact night-coverage at [email or web address], password [facility] 01/14/2014, 10:16 AM  LOS: 1 day      **Disclaimer: This note may have been dictated with voice recognition software. Similar sounding words can inadvertently be transcribed and this note may contain transcription errors which may not have been corrected upon publication of note.**

## 2014-01-14 NOTE — Progress Notes (Addendum)
Report given to receiving RN. Patient in bed watching TV. No signs or symptoms of distress or discomfort noted.

## 2014-01-14 NOTE — Progress Notes (Signed)
The patient received morphine and Vicodin overnight for generalized pain.  Her VS are stable and she was able to sleep some overnight.

## 2014-01-14 NOTE — Progress Notes (Signed)
UR Completed.  Vergie Living [id number] 01/14/2014

## 2014-01-14 NOTE — Evaluation (Signed)
Physical Therapy Evaluation Patient Details Name: [name] MRN: [id number] DOB: 10/29/1970 Today's Date: 01/14/2014   History of Present Illness  Pt is a 42 y/o female admitted s/p systemic lupus erythematosus (SLE) exacerbation. PMH includes morbid obesity, DM, lupus.   Clinical Impression  Pt admitted with the above. Pt currently with functional limitations due to the deficits listed below (see PT Problem List). At the time of PT eval, pt was able to ambulate around the room, with fatigue being limiting factor for distance. O2 sats remained at 95% with 2L/min supplemental O2 donned. Pt will benefit from skilled PT to increase their independence and safety with mobility to allow discharge to the venue listed below.       Follow Up Recommendations Home health PT;Supervision - Intermittent    Equipment Recommendations  Rolling walker with 5" wheels    Recommendations for Other Services       Precautions / Restrictions Precautions Precautions: Fall Restrictions Weight Bearing Restrictions: No      Mobility  Bed Mobility Overal bed mobility: Needs Assistance Bed Mobility: Supine to Sit     Supine to sit: Min guard     General bed mobility comments: Pt able to transition to full sitting position with use of bed rails for support.   Transfers Overall transfer level: Needs assistance Equipment used: Rolling walker (2 wheeled) Transfers: Sit to/from Stand Sit to Stand: Min guard         General transfer comment: Steadying assist only. Pt able to stand to RW demonstrating proper hand placement and overall safety awareness.   Ambulation/Gait Ambulation/Gait assistance: Supervision Ambulation Distance (Feet): 35 Feet Assistive device: Rolling walker (2 wheeled) Gait Pattern/deviations: Step-through pattern;Decreased stride length;Trunk flexed;Wide base of support Gait velocity: Decreased Gait velocity interpretation: Below normal speed for age/gender General Gait  Details: Pt ambulating very slowly and guarded. Pt reports fatigue after ~10' but is able to ambulate to the recliner across the room. No LOB noted.   Stairs            Wheelchair Mobility    Modified Rankin (Stroke Patients Only)       Balance Overall balance assessment: Needs assistance Sitting-balance support: Feet supported;No upper extremity supported Sitting balance-Leahy Scale: Fair     Standing balance support: Bilateral upper extremity supported;During functional activity Standing balance-Leahy Scale: Fair                               Pertinent Vitals/Pain Vitals stable throughout session.     Home Living Family/patient expects to be discharged to:: Private residence Living Arrangements: Other relatives ([place]) Available Help at Discharge: Family;Available PRN/intermittently Type of Home: House Home Access: Stairs to enter Entrance Stairs-Rails: Right Entrance Stairs-Number of Steps: 3 Home Layout: One level Home Equipment: Walker - 4 wheels;Bedside commode;Shower seat;Cane - quad      Prior Function Level of Independence: Needs assistance   Gait / Transfers Assistance Needed: mostly uses a QC due to O2 tank - says rollator is too much for her to handle with the O2 tank as well.  ADL's / Homemaking Assistance Needed: Aide 5x/day 2hrs/day. Assists with set-up for bathing, housekeeping, doing hair.        Hand Dominance   Dominant Hand: Right    Extremity/Trunk Assessment   Upper Extremity Assessment: LUE deficits/detail       LUE Deficits / Details: Decreased strength and AROM consistent with residual weakness from CVA 10/2012.  Lower Extremity Assessment: LLE deficits/detail   LLE Deficits / Details: Decreased strength and AROM consistent with residual weakness from CVA 10/2012.  Cervical / Trunk Assessment: Normal  Communication   Communication: No difficulties  Cognition Arousal/Alertness: Awake/alert Behavior During  Therapy: WFL for tasks assessed/performed Overall Cognitive Status: Within Functional Limits for tasks assessed                      General Comments      Exercises        Assessment/Plan    PT Assessment Patient needs continued PT services  PT Diagnosis Difficulty walking;Generalized weakness   PT Problem List Decreased strength;Decreased range of motion;Decreased activity tolerance;Decreased mobility;Decreased balance;Decreased knowledge of use of DME;Decreased safety awareness;Cardiopulmonary status limiting activity  PT Treatment Interventions DME instruction;Gait training;Stair training;Functional mobility training;Therapeutic activities;Therapeutic exercise;Neuromuscular re-education;Patient/family education   PT Goals (Current goals can be found in the Care Plan section) Acute Rehab PT Goals Patient Stated Goal: To decrease fatigue with activity PT Goal Formulation: With patient Time For Goal Achievement: 01/21/14 Potential to Achieve Goals: Good    Frequency Min 3X/week   Barriers to discharge        Co-evaluation               End of Session Equipment Utilized During Treatment: Oxygen Activity Tolerance: Patient limited by fatigue Patient left: in chair;with call bell/phone within reach Nurse Communication: Mobility status         Time: 7262-0355 PT Time Calculation (min): 23 min   Charges:   PT Evaluation $Initial PT Evaluation Tier I: 1 Procedure PT Treatments $Therapeutic Activity: 8-22 mins   PT G CodesJolyn Lent 01/14/2014, 11:38 AM  Jolyn Lent, PT, DPT Acute Rehabilitation Services Pager: [phone]

## 2014-01-15 DIAGNOSIS — I10 Essential (primary) hypertension: Secondary | ICD-10-CM

## 2014-01-15 DIAGNOSIS — K2100 Gastro-esophageal reflux disease with esophagitis, without bleeding: Secondary | ICD-10-CM

## 2014-01-15 DIAGNOSIS — K3184 Gastroparesis: Secondary | ICD-10-CM

## 2014-01-15 DIAGNOSIS — E1149 Type 2 diabetes mellitus with other diabetic neurological complication: Secondary | ICD-10-CM

## 2014-01-15 DIAGNOSIS — G4733 Obstructive sleep apnea (adult) (pediatric): Secondary | ICD-10-CM

## 2014-01-15 DIAGNOSIS — J9610 Chronic respiratory failure, unspecified whether with hypoxia or hypercapnia: Secondary | ICD-10-CM

## 2014-01-15 LAB — GLUCOSE, CAPILLARY: GLUCOSE-CAPILLARY: 327 mg/dL — AB (ref 70–99)

## 2014-01-15 MED ORDER — OMEPRAZOLE 40 MG PO CPDR: 40.0000 mg | DELAYED_RELEASE_CAPSULE | Freq: Every day | ORAL | Status: DC

## 2014-01-15 MED ORDER — FAMOTIDINE 20 MG PO TABS: 20.0000 mg | ORAL_TABLET | Freq: Every day | ORAL | Status: DC

## 2014-01-15 MED ORDER — PREDNISONE 10 MG PO TABS: ORAL_TABLET | ORAL | Status: DC

## 2014-01-15 NOTE — Clinical Documentation Improvement (Addendum)
  Query 1 of 2   Clinical Indicators:  Known history of GERD   Presumed diagnosis of Gastroparesis  Admitted with SLE flare  Presenting Complaint Chest Pain  Please document in the progress notes and discharge summary the Possible, Probable, Suspected or Known Cause of the patient's Chest Pain.   Query 2 of 2  Clinical Indicators:  Morbid Obesity BMI 57.9  Uses chronic home oxygen per H&P  Obstructive Sleep Apnea on Home Cpap per H&P  Chronic Respiratory Failure documented on Non-Hospital Problem List 10/09/2012 by Dr. Sarajane Jews   Possible Clinically Conditions:  Chronic Respiratory Failure 2/2 ..............................  Other Condition  Unable to Clinically Determine     Thank You,  Erling Conte ,RN BSN CCDS Certified Clinical Documentation Specialist:  [phone] [facility] [employer] [facility]   Patient CP presumed to be secondary to GERD (will be added to discharge summary); also with chronic resp failure due to OSA and OHS; will add to discharge summary..  Thanks  Barton Dubois [phone]

## 2014-01-15 NOTE — Discharge Summary (Signed)
Physician Discharge Summary  Omolola Mittman CXK:[id number] DOB: 08-Nov-1970 DOA: 01/13/2014  PCP: Elyn Peers, MD  Admit date: 01/13/2014 Discharge date: 01/15/2014  Time spent: >30 minutes  Recommendations for Outpatient Follow-up:  Close follow up to CBG's and diabetes Reassess BP and adjust medications as needed  Discharge Diagnoses:  Diffuse pain due to SLE flare CP due to GERD GERD Chronic resp failure due to OSA/OHS OSA on CPAP Morbid obesity Diabetic gastroparesis   Discharge Condition: stable and improved. Will discharge home with [facility]. Patient advise to follow with PCP in 10 days  Diet recommendation: low sodium, low calorie and low carbohydrates   Filed Weights   01/13/14 0716 01/14/14 0506 01/15/14 0445  Weight: 172.6 kg (380 lb 8.2 oz) 172.594 kg (380 lb 8 oz) 171.913 kg (379 lb)    History of present illness:  43 y.o. female Past oral history of diabetes, morbid obesity and lupus who for the last several days has been complaining of diffuse pain, hurting all over. Patient endorses hurting midsternally in her chest and she felt more short of breath when leaning back. She became concerned and came into the emergency room for further evaluation. Chest x-ray noted some pulmonary vascular congestion, however BNP was normal. All of her labs were WNL, with the exception of a sed rate which was elevated at 40. Cardiac markers and troponin were normal. Hospitalist were called to admit patient for further evaluation and treatment.  Hospital Course:  SLE flare/ *Diffuse pain:  - ESR and CRP high -responded to use of steroids - She relates pain is improving -will set up [facility] to assist and help with medications compliance - has remained afebrile.  - she is having control of pain with usual chronic pain regimen -will discharge with tapering steroids -continue immunosuppressant regimen and plaquenil   Chest pain: due to GERD - negative cardiac markers.  -no abnormalities on  telemetry or EKG -started on PPI  Morbid obesity: Patient meets criteria with BMI of 57  -advise to follow low calorie diet and to increase activity  Diabetic gastroparesis:  - Stable for now.   History of diastolic heart: BNP normal, no JVD and clear lungs auscultation -advise to continue low sodium diet -daily weight and strict fluid intake control  Diabetes mellitus:  -Continue current hypoglycemic regimen, low carbohydrates diet and follow up with PCP for further adjustments.   Physical deconditioning: physical therapy recommended 24 hours assistance and if not available short term rehab at SNF. Patient refused SNF. Will go home with family care and will follow instructions to improve her condition.  Chronic resp failure: most likely a combination of OSA and OHS. -continue O2 supplementation -advise to lose weight -continue use of CPAP at bedtime  GERD:  -continue QHS famotidine -started on PPI  OSA: continue CPAP   Procedures:  See below for x-ray reports   Consultations:  None   Discharge Exam: Filed Vitals:   01/15/14 0959  BP: 138/81  Pulse: 89  Temp: 98.2 F (36.8 C)  Resp: 18   General: Alert, awake, oriented x3, in no acute distress.  HEENT: No bruits, no goiter; no JVD Heart: Regular rate and rhythm, without murmurs, rubs, gallops.  Lungs: Good air movement, no wheezing, no rhonchi Abdomen: Soft, nontender, nondistended, positive bowel sounds.    Discharge Instructions You were cared for by a hospitalist during your hospital stay. If you have any questions about your discharge medications or the care you received while you were in the hospital  after you are discharged, you can call the unit and asked to speak with the hospitalist on call if the hospitalist that took care of you is not available. Once you are discharged, your primary care physician will handle any further medical issues. Please note that NO REFILLS for any discharge medications will  be authorized once you are discharged, as it is imperative that you return to your primary care physician (or establish a relationship with a primary care physician if you do not have one) for your aftercare needs so that they can reassess your need for medications and monitor your lab values.  Discharge Instructions   Diet - low sodium heart healthy    Complete by:  As directed      Discharge instructions    Complete by:  As directed   Take medications as prescribed Follow a low sodium diet (less than 2 grams of sodium daily) Low calorie diet (less than 2500 calories in 24 hours) Please maintain your fluid intake between 1.8-2 L of fluids daily Increase your activity to help you losing weight Arrange follow up with PCP in 10 days            Medication List         alendronate 70 MG tablet  Commonly known as:  FOSAMAX  Take 70 mg by mouth every 7 (seven) days. saturday. Take with a full glass of water on an empty stomach.     ALPRAZolam 0.5 MG tablet  Commonly known as:  XANAX  Take 0.5 mg by mouth 2 (two) times daily as needed for sleep or anxiety.     aspirin EC 81 MG tablet  Take 81 mg by mouth daily.     azaTHIOprine 50 MG tablet  Commonly known as:  IMURAN  Take 100 mg by mouth 2 (two) times daily.     cetirizine 10 MG tablet  Commonly known as:  ZYRTEC  Take 10 mg by mouth daily.     citalopram 10 MG tablet  Commonly known as:  CELEXA  Take 1 tablet (10 mg total) by mouth every evening.     clopidogrel 75 MG tablet  Commonly known as:  PLAVIX  Take 75 mg by mouth daily.     COREG 3.125 MG tablet  Generic drug:  carvedilol  Take 3.125 mg by mouth 2 (two) times daily with a meal.     cycloSPORINE 0.05 % ophthalmic emulsion  Commonly known as:  RESTASIS  Place 1 drop into both eyes 3 (three) times daily.     famotidine 20 MG tablet  Commonly known as:  PEPCID  Take 1 tablet (20 mg total) by mouth at bedtime.     gabapentin 300 MG capsule  Commonly known  as:  NEURONTIN  Take 300 mg by mouth 2 (two) times daily.     HYDROcodone-acetaminophen 5-325 MG per tablet  Commonly known as:  NORCO/VICODIN  Take 1 tablet by mouth every 6 (six) hours as needed for moderate pain.     hydroxychloroquine 200 MG tablet  Commonly known as:  PLAQUENIL  Take 200 mg by mouth 2 (two) times daily.     insulin regular human CONCENTRATED 500 UNIT/ML Soln injection  Commonly known as:  HUMULIN R  Inject 0.02 mLs (10 Units total) into the skin 3 (three) times daily with meals. Because it is concentrated, please draw up as though it is just 10 units at a time.     isosorbide-hydrALAZINE 20-37.5 MG per  tablet  Commonly known as:  BIDIL  Take 1 tablet by mouth 3 (three) times daily.     metFORMIN 500 MG tablet  Commonly known as:  GLUCOPHAGE  Take 500 mg by mouth 2 (two) times daily with a meal.     norethindrone 0.35 MG tablet  Commonly known as:  MICRONOR,CAMILA,ERRIN  Take 1 tablet (0.35 mg total) by mouth daily.     omeprazole 40 MG capsule  Commonly known as:  PRILOSEC  Take 1 capsule (40 mg total) by mouth daily.     ondansetron 8 MG disintegrating tablet  Commonly known as:  ZOFRAN ODT  Take 1 tablet (8 mg total) by mouth every 8 (eight) hours as needed for nausea.     potassium chloride SA 20 MEQ tablet  Commonly known as:  K-DUR,KLOR-CON  Take 20 mEq by mouth 2 (two) times daily.     predniSONE 10 MG tablet  Commonly known as:  DELTASONE  Take 6 tablets by mouth daily X 2 days; then 4 tablets by mouth daily X 2 days; then 2 tablets by mouth daily X 2 days; then 1/2 tablet daily X 2 days and then resume your chronic prednisone dose of 32m tablet daily.     PROAIR HFA 108 (90 BASE) MCG/ACT inhaler  Generic drug:  albuterol  Inhale 2 puffs into the lungs every 6 (six) hours as needed. For shortness of breath/wheeze     TANZEUM 30 MG Pen  Generic drug:  Albiglutide  Inject 30 mg into the skin once a week. Wednesdays     torsemide 20 MG  tablet  Commonly known as:  DEMADEX  Take 1 tablet (20 mg total) by mouth 2 (two) times daily.     traZODone 50 MG tablet  Commonly known as:  DESYREL  Take 1 tablet (50 mg total) by mouth at bedtime.       Allergies  Allergen Reactions  . Oxycodone-Acetaminophen Hives    FYI: no reaction to hydrocodone; tolerates dilaudid  . Codeine Other (See Comments)    (Derm)  . Metoclopramide     Developed restless leg, akathisia type limb movements.   . Oxycodone-Acetaminophen Other (See Comments)       Follow-up Information   Follow up with ANapavine (RN)    Contact information:   [address] [place] [phone]      Follow up with BElyn Peers MD. Schedule an appointment as soon as possible for a visit in 10 days.   Specialty:  Family Medicine   Contact information:   [address] [phone]       The results of significant diagnostics from this hospitalization (including imaging, microbiology, ancillary and laboratory) are listed below for reference.    Significant Diagnostic Studies: Dg Chest Port 1 View  01/13/2014   CLINICAL DATA:  Chest pain, now worsening.  EXAM: PORTABLE CHEST - 1 VIEW  COMPARISON:  Chest radiograph April 26, 2014.  FINDINGS: The cardiac silhouette appears moderately enlarged, which may be accentuated by portable technique. Mild central pulmonary vasculature congestion is similar. No pleural effusions or focal consolidations. No pneumothorax.  Multiple EKG lines overlie the patient and may obscure subtle underlying pathology. Soft tissue planes and included osseous structures are nonsuspicious.  IMPRESSION: Stable cardiomegaly and central pulmonary vasculature congestion.   Electronically Signed   By: CElon Alas  On: 01/13/2014 03:49   [facility]  12/25/2013   CLINICAL DATA:  Shortness of breath after wisdom tooth extraction.  EXAM: PORTABLE CHEST - 1  VIEW  COMPARISON:  Chest radiograph November 11, 2013  FINDINGS: The cardiac silhouette remains mildly enlarged, mediastinal silhouette is nonsuspicious. Similar central pulmonary vasculature congestion without pleural effusions or focal consolidations. No pneumothorax.  Multiple EKG lines overlie the patient and may obscure subtle underlying pathology. Large body habitus, osseous structures are nonsuspicious.  IMPRESSION: Stable cardiomegaly with central pulmonary vasculature congestion.   Electronically Signed   By: Elon Alas   On: 12/25/2013 00:57   Labs: Basic Metabolic Panel:  Recent Labs Lab 01/13/14 0240 01/13/14 1150  NA 142  --   K 4.3  --   CL 102  --   CO2 26  --   GLUCOSE 61*  --   BUN 12  --   CREATININE 1.11* 1.07  CALCIUM 9.9  --    Liver Function Tests:  Recent Labs Lab 01/13/14 0240  AST 16  ALT 9  ALKPHOS 74  BILITOT 0.2*  PROT 7.6  ALBUMIN 3.6   CBC:  Recent Labs Lab 01/13/14 0240 01/13/14 1150  WBC 7.0 6.7  NEUTROABS 2.8  --   HGB 11.3* 10.7*  HCT 35.6* 33.9*  MCV 84.8 84.8  PLT 311 294   Cardiac Enzymes:  Recent Labs Lab 01/13/14 1150 01/13/14 1405 01/13/14 1707 01/14/14 0035  CKTOTAL  --   --   --  153  TROPONINI <0.30 <0.30 <0.30  --    BNP: BNP (last 3 results)  Recent Labs  04/01/13 1228 04/18/13 2235 01/13/14 0312  PROBNP 29.6 231.5* 19.2   CBG:  Recent Labs Lab 01/14/14 0535 01/14/14 1053 01/14/14 1642 01/14/14 2118 01/15/14 0610  GLUCAP 232* 228* 280* 354* 327*       Signed:  Barton Dubois  [facility] 01/15/2014, 10:28 AM

## 2014-01-15 NOTE — Progress Notes (Signed)
The patient stated that she did not feel as well last night as the previous night, but could not really describe her symptoms.  She received PRN pain medication for generalized pain overnight and also received a Xanax at 0200.  She was able to sleep after receiving the Xanax.  She stated that she was feeling better this morning.

## 2014-01-17 NOTE — ED Provider Notes (Signed)
CSN: [id number]     Arrival date & time 01/13/14  0212 History   First MD Initiated Contact with Patient 01/13/14 0343     Chief Complaint  Patient presents with  . Shortness of Breath  . Chest Pain     (Consider location/radiation/quality/duration/timing/severity/associated sxs/prior Treatment) HPI  This patient is a middle aged woman with multiple chronic medical problems, including SLE.   She comes in today with complaint of a pain crisis related to her SLE. She says that she is hurting all over and is unable to move because of the pain. Her pain is aching and present throughout the neck, back, chest and all extremities. It is 10/10. It has not been relived by use of her home prescription medications.  She has not seen her PCP regarding this current episode of pain.   Moving makes the pain worse. The patient has chest pain. However, she notes that the pain in her chest is the same as the pain she is experiencing everywhere else - in terms of quality.  She has chronic DOE and this is unchanged. She denies cough and fever. She reports compliance with all meds.   Past Medical History  Diagnosis Date  . Bell's palsy 08/09/2010  . SLE 08/09/2010  . SJOGREN'S SYNDROME 08/09/2010  . Hypertension   . High cholesterol   . Anginal pain   . Obstructive sleep apnea on CPAP 2011  . Anxiety   . GERD (gastroesophageal reflux disease)   . CHF (congestive heart failure)   . Acute renal failure      Intractable nausea vomiting secondary to diabetic gastroparesis causing dehydration and acute renal failure /notes 04/01/2013  . Liver mass     biopsied 03/2013 at [facility], not malignant.  is to undergo radiologic ablation of the mass in sept/October 2014.   Marland Kitchen Pneumonia 2000's    "once" (04/01/2013)  . On home oxygen therapy     "2L 24/7" (04/01/2013)  . Migraines     "maybe a couple times/yr" (04/01/2013)  . Stroke 2010; 10/2012    "left side is still weak from it, never fully regained full strength;  no additions from stroke 10/2012"  . Obesity   . Family history of malignant neoplasm of breast   . Family history of malignant neoplasm of ovary   . Lupus   . Chest pain 01/13/2014  . Diabetic gastroparesis associated with type 2 diabetes mellitus     this is presumed diagnoses, not confirmed by any studies.   . Shortness of breath    Past Surgical History  Procedure Laterality Date  . Ectopic pregnancy surgery  1999  . Cardiac catheterization  02/07/12  . Hernia repair  5885'O    umbilical  . Epidermoid cyst excision  2007     left breast  . Esophagogastroduodenoscopy N/A 02/26/2013    Procedure: ESOPHAGOGASTRODUODENOSCOPY (EGD);  Surgeon: Irene Shipper, MD;  Location: [facility] ENDOSCOPY;  Service: Endoscopy;  Laterality: N/A;  . Breast cyst excision Left 2007    epidermoid   Family History  Problem Relation Age of Onset  . Diabetes Mother   . Hypertension Other   . Stroke Other   . Arthritis Other   . Ovarian cancer Sister 84    maternal half-sister; RAD51C positive  . Breast cancer Maternal Aunt 98    currently 42  . Stomach cancer Maternal Uncle     dx 34s; deceased  . Cancer Paternal Aunt     unk. primary; deceased  74s  . Prostate cancer Paternal Uncle     deceased 44s  . Breast cancer Cousin     deceased 38s; daughter of mat uncle with stomach ca  . Prostate cancer Maternal Uncle     deceased 20s  . Breast cancer Maternal Aunt     deceased 27s  . Cancer Maternal Aunt     unk. primary  . Cancer Maternal Aunt     unk. primary  . Ovarian cancer Maternal Aunt     deceased 50s   History  Substance Use Topics  . Smoking status: Former Smoker -- 3 years    Types: Cigarettes    Quit date: 06/01/1990  . Smokeless tobacco: Never Used  . Alcohol Use: No   OB History   Grav Para Term Preterm Abortions TAB SAB Ect Mult Living   _0 Review of Systems 10 point ROS performed and is negative with the exception of sx  Noted above.     Allergies   Oxycodone-acetaminophen; Codeine; Metoclopramide; and Oxycodone-acetaminophen  Home Medications   Prior to Admission medications   Medication Sig Start Date End Date Taking? Authorizing Provider  Albiglutide (TANZEUM) 30 MG PEN Inject 30 mg into the skin once a week. Wednesdays   Yes Historical Provider, MD  albuterol (PROAIR HFA) 108 (90 BASE) MCG/ACT inhaler Inhale 2 puffs into the lungs every 6 (six) hours as needed. For shortness of breath/wheeze   Yes Historical Provider, MD  alendronate (FOSAMAX) 70 MG tablet Take 70 mg by mouth every 7 (seven) days. saturday. Take with a full glass of water on an empty stomach.   Yes Historical Provider, MD  aspirin EC 81 MG tablet Take 81 mg by mouth daily.   Yes Historical Provider, MD  azaTHIOprine (IMURAN) 50 MG tablet Take 100 mg by mouth 2 (two) times daily.    Yes Historical Provider, MD  carvedilol (COREG) 3.125 MG tablet Take 3.125 mg by mouth 2 (two) times daily with a meal.   Yes Historical Provider, MD  cetirizine (ZYRTEC) 10 MG tablet Take 10 mg by mouth daily.   Yes Historical Provider, MD  citalopram (CELEXA) 10 MG tablet Take 1 tablet (10 mg total) by mouth every evening. 04/06/13  Yes Belkys A Regalado, MD  clopidogrel (PLAVIX) 75 MG tablet Take 75 mg by mouth daily.   Yes Historical Provider, MD  cycloSPORINE (RESTASIS) 0.05 % ophthalmic emulsion Place 1 drop into both eyes 3 (three) times daily.   Yes Historical Provider, MD  gabapentin (NEURONTIN) 300 MG capsule Take 300 mg by mouth 2 (two) times daily.    Yes Historical Provider, MD  HYDROcodone-acetaminophen (NORCO/VICODIN) 5-325 MG per tablet Take 1 tablet by mouth every 6 (six) hours as needed for moderate pain. 11/18/13  Yes Belkys A Regalado, MD  hydroxychloroquine (PLAQUENIL) 200 MG tablet Take 200 mg by mouth 2 (two) times daily.    Yes Historical Provider, MD  insulin regular human CONCENTRATED (HUMULIN R) 500 UNIT/ML SOLN injection Inject 0.02 mLs (10 Units total) into the skin  3 (three) times daily with meals. Because it is concentrated, please draw up as though it is just 10 units at a time. 11/18/13  Yes Belkys A Regalado, MD  isosorbide-hydrALAZINE (BIDIL) 20-37.5 MG per tablet Take 1 tablet by mouth 3 (three) times daily.   Yes Historical Provider, MD  metFORMIN (GLUCOPHAGE) 500 MG tablet Take 500 mg by mouth 2 (two) times  daily with a meal.   Yes Historical Provider, MD  ondansetron (ZOFRAN ODT) 8 MG disintegrating tablet Take 1 tablet (8 mg total) by mouth every 8 (eight) hours as needed for nausea. 12/25/13  Yes Varney Biles, MD  potassium chloride SA (K-DUR,KLOR-CON) 20 MEQ tablet Take 20 mEq by mouth 2 (two) times daily.   Yes Historical Provider, MD  torsemide (DEMADEX) 20 MG tablet Take 1 tablet (20 mg total) by mouth 2 (two) times daily. 04/06/13  Yes Belkys A Regalado, MD  traZODone (DESYREL) 50 MG tablet Take 1 tablet (50 mg total) by mouth at bedtime. 04/06/13  Yes Belkys A Regalado, MD  ALPRAZolam (XANAX) 0.5 MG tablet Take 0.5 mg by mouth 2 (two) times daily as needed for sleep or anxiety.     Historical Provider, MD  famotidine (PEPCID) 20 MG tablet Take 1 tablet (20 mg total) by mouth at bedtime. 01/15/14   Barton Dubois, MD  norethindrone (MICRONOR,CAMILA,ERRIN) 0.35 MG tablet Take 1 tablet (0.35 mg total) by mouth daily. 10/03/13   Lahoma Crocker, MD  omeprazole (PRILOSEC) 40 MG capsule Take 1 capsule (40 mg total) by mouth daily. 01/15/14   Barton Dubois, MD  predniSONE (DELTASONE) 10 MG tablet Take 6 tablets by mouth daily X 2 days; then 4 tablets by mouth daily X 2 days; then 2 tablets by mouth daily X 2 days; then 1/2 tablet daily X 2 days and then resume your chronic prednisone dose of 8m tablet daily. 01/15/14   CBarton Dubois MD   BP 138/81  Pulse 89  Temp(Src) 98.2 F (36.8 C) (Oral)  Resp 18  Ht 5' 8" (1.727 m)  Wt 379 lb (171.913 kg)  BMI 57.64 kg/m2  SpO2 100%  LMP 11/13/2013 Physical Exam  Constitutional: She is oriented to person,  place, and time. She appears well-developed and well-nourished.  HENT:  Head: Normocephalic and atraumatic.  Eyes: Conjunctivae and EOM are normal. Pupils are equal, round, and reactive to light.  Neck: Neck supple.  Cardiovascular: Normal rate, regular rhythm and normal heart sounds.   Pulmonary/Chest: Effort normal and breath sounds normal. No respiratory distress. She has no wheezes. She has no rales. She exhibits tenderness.  Abdominal: Soft. She exhibits no distension.  obese  Musculoskeletal:  Extremities are diffusely tender to palpation.  Trace and symmetric pretibial edema.   Neurological: She is alert and oriented to person, place, and time.  No focal neurologic deficits.   Skin: Skin is warm and dry.  Psychiatric:  Mood appears to be anxious    ED Course  Procedures (including critical care time) Labs Review Labs Reviewed  CBC WITH DIFFERENTIAL - Abnormal; Notable for the following:    Hemoglobin 11.3 (*)    HCT 35.6 (*)    Neutrophils Relative % 40 (*)    Monocytes Relative 13 (*)    All other components within normal limits  COMPREHENSIVE METABOLIC PANEL - Abnormal; Notable for the following:    Glucose, Bld 61 (*)    Creatinine, Ser 1.11 (*)    Total Bilirubin 0.2 (*)    GFR calc non Af Amer 60 (*)    GFR calc Af Amer 69 (*)    All other components within normal limits  SEDIMENTATION RATE - Abnormal; Notable for the following:    Sed Rate 40 (*)    All other components within normal limits  CBC - Abnormal; Notable for the following:    Hemoglobin 10.7 (*)    HCT 33.9 (*)  All other components within normal limits  CREATININE, SERUM - Abnormal; Notable for the following:    GFR calc non Af Amer 63 (*)    GFR calc Af Amer 73 (*)    All other components within normal limits  GLUCOSE, CAPILLARY - Abnormal; Notable for the following:    Glucose-Capillary 135 (*)    All other components within normal limits  GLUCOSE, CAPILLARY - Abnormal; Notable for the  following:    Glucose-Capillary 171 (*)    All other components within normal limits  C-REACTIVE PROTEIN - Abnormal; Notable for the following:    CRP <0.5 (*)    All other components within normal limits  GLUCOSE, CAPILLARY - Abnormal; Notable for the following:    Glucose-Capillary 232 (*)    All other components within normal limits  GLUCOSE, CAPILLARY - Abnormal; Notable for the following:    Glucose-Capillary 228 (*)    All other components within normal limits  GLUCOSE, CAPILLARY - Abnormal; Notable for the following:    Glucose-Capillary 280 (*)    All other components within normal limits  GLUCOSE, CAPILLARY - Abnormal; Notable for the following:    Glucose-Capillary 354 (*)    All other components within normal limits  GLUCOSE, CAPILLARY - Abnormal; Notable for the following:    Glucose-Capillary 327 (*)    All other components within normal limits  PRO B NATRIURETIC PEPTIDE  TROPONIN I  TROPONIN I  TROPONIN I  GLUCOSE, CAPILLARY  CK  I-STAT TROPOININ, ED   DG Chest Port 1 View (Final result)  Result time: 01/13/14 03:49:06    Final result by Rad Results In Interface (01/13/14 03:49:06)    Narrative:   CLINICAL DATA: Chest pain, now worsening.  EXAM: PORTABLE CHEST - 1 VIEW  COMPARISON: Chest radiograph April 26, 2014.  FINDINGS: The cardiac silhouette appears moderately enlarged, which may be accentuated by portable technique. Mild central pulmonary vasculature congestion is similar. No pleural effusions or focal consolidations. No pneumothorax.  Multiple EKG lines overlie the patient and may obscure subtle underlying pathology. Soft tissue planes and included osseous structures are nonsuspicious.  IMPRESSION: Stable cardiomegaly and central pulmonary vasculature congestion.   Electronically Signed By: Elon Alas On: 01/13/2014 03:49      EKG Interpretation   Date/Time:  Monday January 13 2014 02:18:32 EDT Ventricular Rate:  100 PR  Interval:  136 QRS Duration: 94 QT Interval:  334 QTC Calculation: 430 R Axis:   23 Text Interpretation:  Sinus rhythm with occasional Premature ventricular  complexes Nonspecific ST and T wave abnormality Abnormal ECG ED PHYSICIAN  INTERPRETATION AVAILABLE IN [facility] Confirmed by TEST, Record  ([id number]) on 01/15/2014 11:45:20 AM      MDM   Final diagnoses:  Lupus  Arthralgia  Chest pain   Despite aggressive measures to control the patient's pain - which we believe to be secondary to SLE flare, the patient says she can't stand or walk because of the pain and will come right back to the ED today if she is discharged. Case discussed with Dr. Jennette Kettle who has accepted the patient as an observation admission.     Elyn Peers, MD 01/17/14 [phone]

## 2014-01-20 ENCOUNTER — Ambulatory Visit: Payer: Medicaid Other | Admitting: Obstetrics & Gynecology

## 2014-01-27 ENCOUNTER — Ambulatory Visit: Payer: Medicaid Other | Admitting: Obstetrics & Gynecology

## 2014-02-19 ENCOUNTER — Ambulatory Visit: Payer: Medicaid Other | Admitting: Obstetrics & Gynecology

## 2014-03-05 ENCOUNTER — Emergency Department (HOSPITAL_COMMUNITY): Payer: Medicaid Other

## 2014-03-05 ENCOUNTER — Encounter (HOSPITAL_COMMUNITY): Payer: Self-pay | Admitting: Emergency Medicine

## 2014-03-05 ENCOUNTER — Inpatient Hospital Stay (HOSPITAL_COMMUNITY)
Admission: EM | Admit: 2014-03-05 | Discharge: 2014-03-07 | DRG: 074 | Disposition: A | Discharge status: Home / Self Care | Payer: Medicaid Other | Attending: Internal Medicine | Admitting: Internal Medicine

## 2014-03-05 DIAGNOSIS — I509 Heart failure, unspecified: Secondary | ICD-10-CM | POA: Diagnosis present

## 2014-03-05 DIAGNOSIS — E119 Type 2 diabetes mellitus without complications: Secondary | ICD-10-CM

## 2014-03-05 DIAGNOSIS — Z87891 Personal history of nicotine dependence: Secondary | ICD-10-CM

## 2014-03-05 DIAGNOSIS — E872 Acidosis, unspecified: Secondary | ICD-10-CM

## 2014-03-05 DIAGNOSIS — Q512 Other doubling of uterus, unspecified: Secondary | ICD-10-CM

## 2014-03-05 DIAGNOSIS — Z9189 Other specified personal risk factors, not elsewhere classified: Secondary | ICD-10-CM

## 2014-03-05 DIAGNOSIS — R1110 Vomiting, unspecified: Secondary | ICD-10-CM

## 2014-03-05 DIAGNOSIS — M3500 Sicca syndrome, unspecified: Secondary | ICD-10-CM | POA: Diagnosis present

## 2014-03-05 DIAGNOSIS — G510 Bell's palsy: Secondary | ICD-10-CM | POA: Diagnosis present

## 2014-03-05 DIAGNOSIS — G4733 Obstructive sleep apnea (adult) (pediatric): Secondary | ICD-10-CM | POA: Diagnosis present

## 2014-03-05 DIAGNOSIS — Z803 Family history of malignant neoplasm of breast: Secondary | ICD-10-CM

## 2014-03-05 DIAGNOSIS — E1143 Type 2 diabetes mellitus with diabetic autonomic (poly)neuropathy: Secondary | ICD-10-CM

## 2014-03-05 DIAGNOSIS — F411 Generalized anxiety disorder: Secondary | ICD-10-CM | POA: Diagnosis present

## 2014-03-05 DIAGNOSIS — Z8673 Personal history of transient ischemic attack (TIA), and cerebral infarction without residual deficits: Secondary | ICD-10-CM

## 2014-03-05 DIAGNOSIS — Z794 Long term (current) use of insulin: Secondary | ICD-10-CM | POA: Diagnosis not present

## 2014-03-05 DIAGNOSIS — Z8041 Family history of malignant neoplasm of ovary: Secondary | ICD-10-CM

## 2014-03-05 DIAGNOSIS — E1169 Type 2 diabetes mellitus with other specified complication: Secondary | ICD-10-CM | POA: Diagnosis present

## 2014-03-05 DIAGNOSIS — Z886 Allergy status to analgesic agent status: Secondary | ICD-10-CM

## 2014-03-05 DIAGNOSIS — Z6841 Body Mass Index (BMI) 40.0 and over, adult: Secondary | ICD-10-CM

## 2014-03-05 DIAGNOSIS — M329 Systemic lupus erythematosus, unspecified: Secondary | ICD-10-CM | POA: Diagnosis present

## 2014-03-05 DIAGNOSIS — L723 Sebaceous cyst: Secondary | ICD-10-CM

## 2014-03-05 DIAGNOSIS — G8190 Hemiplegia, unspecified affecting unspecified side: Secondary | ICD-10-CM | POA: Diagnosis present

## 2014-03-05 DIAGNOSIS — E669 Obesity, unspecified: Secondary | ICD-10-CM | POA: Diagnosis present

## 2014-03-05 DIAGNOSIS — K5903 Drug induced constipation: Secondary | ICD-10-CM

## 2014-03-05 DIAGNOSIS — D72829 Elevated white blood cell count, unspecified: Secondary | ICD-10-CM

## 2014-03-05 DIAGNOSIS — E109 Type 1 diabetes mellitus without complications: Secondary | ICD-10-CM

## 2014-03-05 DIAGNOSIS — K219 Gastro-esophageal reflux disease without esophagitis: Secondary | ICD-10-CM | POA: Diagnosis present

## 2014-03-05 DIAGNOSIS — R9431 Abnormal electrocardiogram [ECG] [EKG]: Secondary | ICD-10-CM | POA: Diagnosis present

## 2014-03-05 DIAGNOSIS — IMO0002 Reserved for concepts with insufficient information to code with codable children: Secondary | ICD-10-CM | POA: Diagnosis not present

## 2014-03-05 DIAGNOSIS — Z7982 Long term (current) use of aspirin: Secondary | ICD-10-CM | POA: Diagnosis not present

## 2014-03-05 DIAGNOSIS — H02409 Unspecified ptosis of unspecified eyelid: Secondary | ICD-10-CM | POA: Diagnosis present

## 2014-03-05 DIAGNOSIS — E7800 Pure hypercholesterolemia, unspecified: Secondary | ICD-10-CM | POA: Diagnosis present

## 2014-03-05 DIAGNOSIS — M3320 Polymyositis, organ involvement unspecified: Secondary | ICD-10-CM

## 2014-03-05 DIAGNOSIS — E1149 Type 2 diabetes mellitus with other diabetic neurological complication: Principal | ICD-10-CM | POA: Diagnosis present

## 2014-03-05 DIAGNOSIS — K769 Liver disease, unspecified: Secondary | ICD-10-CM | POA: Diagnosis present

## 2014-03-05 DIAGNOSIS — Q5128 Other doubling of uterus, other specified: Secondary | ICD-10-CM

## 2014-03-05 DIAGNOSIS — I5032 Chronic diastolic (congestive) heart failure: Secondary | ICD-10-CM

## 2014-03-05 DIAGNOSIS — K2100 Gastro-esophageal reflux disease with esophagitis, without bleeding: Secondary | ICD-10-CM

## 2014-03-05 DIAGNOSIS — R0602 Shortness of breath: Secondary | ICD-10-CM

## 2014-03-05 DIAGNOSIS — G8929 Other chronic pain: Secondary | ICD-10-CM | POA: Diagnosis present

## 2014-03-05 DIAGNOSIS — Z7901 Long term (current) use of anticoagulants: Secondary | ICD-10-CM

## 2014-03-05 DIAGNOSIS — I10 Essential (primary) hypertension: Secondary | ICD-10-CM | POA: Diagnosis present

## 2014-03-05 DIAGNOSIS — R1310 Dysphagia, unspecified: Secondary | ICD-10-CM

## 2014-03-05 DIAGNOSIS — R1013 Epigastric pain: Secondary | ICD-10-CM

## 2014-03-05 DIAGNOSIS — E785 Hyperlipidemia, unspecified: Secondary | ICD-10-CM

## 2014-03-05 DIAGNOSIS — I6529 Occlusion and stenosis of unspecified carotid artery: Secondary | ICD-10-CM | POA: Diagnosis present

## 2014-03-05 DIAGNOSIS — G43909 Migraine, unspecified, not intractable, without status migrainosus: Secondary | ICD-10-CM | POA: Diagnosis present

## 2014-03-05 DIAGNOSIS — Z79899 Other long term (current) drug therapy: Secondary | ICD-10-CM | POA: Diagnosis not present

## 2014-03-05 DIAGNOSIS — R519 Headache, unspecified: Secondary | ICD-10-CM | POA: Diagnosis present

## 2014-03-05 DIAGNOSIS — K3184 Gastroparesis: Secondary | ICD-10-CM | POA: Diagnosis present

## 2014-03-05 DIAGNOSIS — R52 Pain, unspecified: Secondary | ICD-10-CM

## 2014-03-05 DIAGNOSIS — I5023 Acute on chronic systolic (congestive) heart failure: Secondary | ICD-10-CM

## 2014-03-05 DIAGNOSIS — R160 Hepatomegaly, not elsewhere classified: Secondary | ICD-10-CM

## 2014-03-05 DIAGNOSIS — R109 Unspecified abdominal pain: Secondary | ICD-10-CM

## 2014-03-05 DIAGNOSIS — E162 Hypoglycemia, unspecified: Secondary | ICD-10-CM | POA: Diagnosis present

## 2014-03-05 DIAGNOSIS — J189 Pneumonia, unspecified organism: Secondary | ICD-10-CM

## 2014-03-05 DIAGNOSIS — R51 Headache: Secondary | ICD-10-CM

## 2014-03-05 DIAGNOSIS — R531 Weakness: Secondary | ICD-10-CM

## 2014-03-05 LAB — CBC
HCT: 32.5 % — ABNORMAL LOW (ref 36.0–46.0)
HEMOGLOBIN: 10.2 g/dL — AB (ref 12.0–15.0)
MCH: 26.9 pg (ref 26.0–34.0)
MCHC: 31.4 g/dL (ref 30.0–36.0)
MCV: 85.8 fL (ref 78.0–100.0)
PLATELETS: 343 10*3/uL (ref 150–400)
RBC: 3.79 MIL/uL — AB (ref 3.87–5.11)
RDW: 16.6 % — ABNORMAL HIGH (ref 11.5–15.5)
WBC: 13.5 10*3/uL — AB (ref 4.0–10.5)

## 2014-03-05 LAB — COMPREHENSIVE METABOLIC PANEL
ALT: 9 U/L (ref 0–35)
AST: 13 U/L (ref 0–37)
Albumin: 3.1 g/dL — ABNORMAL LOW (ref 3.5–5.2)
Alkaline Phosphatase: 91 U/L (ref 39–117)
Anion gap: 11 (ref 5–15)
BUN: 10 mg/dL (ref 6–23)
CO2: 25 mEq/L (ref 19–32)
Calcium: 8.8 mg/dL (ref 8.4–10.5)
Chloride: 104 mEq/L (ref 96–112)
Creatinine, Ser: 1.06 mg/dL (ref 0.50–1.10)
GFR calc Af Amer: 73 mL/min — ABNORMAL LOW (ref >90)
GFR calc non Af Amer: 63 mL/min — ABNORMAL LOW (ref >90)
Glucose, Bld: 57 mg/dL — ABNORMAL LOW (ref 70–99)
Potassium: 4 mEq/L (ref 3.7–5.3)
Sodium: 140 mEq/L (ref 137–147)
Total Bilirubin: <0.2 mg/dL — ABNORMAL LOW (ref 0.3–1.2)
Total Protein: 6.9 g/dL (ref 6.0–8.3)

## 2014-03-05 LAB — I-STAT TROPONIN, ED: Troponin i, poc: 0 ng/mL (ref 0.00–0.08)

## 2014-03-05 LAB — I-STAT CHEM 8, ED
BUN: 9 mg/dL (ref 6–23)
CHLORIDE: 105 meq/L (ref 96–112)
Calcium, Ion: 1.18 mmol/L (ref 1.12–1.23)
Creatinine, Ser: 1.1 mg/dL (ref 0.50–1.10)
GLUCOSE: 59 mg/dL — AB (ref 70–99)
HEMATOCRIT: 34 % — AB (ref 36.0–46.0)
Hemoglobin: 11.6 g/dL — ABNORMAL LOW (ref 12.0–15.0)
POTASSIUM: 3.7 meq/L (ref 3.7–5.3)
Sodium: 142 mEq/L (ref 137–147)
TCO2: 24 mmol/L (ref 0–100)

## 2014-03-05 LAB — DIFFERENTIAL
Basophils Absolute: 0 10*3/uL (ref 0.0–0.1)
Basophils Relative: 0 % (ref 0–1)
Eosinophils Absolute: 0.3 10*3/uL (ref 0.0–0.7)
Eosinophils Relative: 2 % (ref 0–5)
Lymphocytes Relative: 22 % (ref 12–46)
Lymphs Abs: 3 10*3/uL (ref 0.7–4.0)
Monocytes Absolute: 0.9 10*3/uL (ref 0.1–1.0)
Monocytes Relative: 7 % (ref 3–12)
Neutro Abs: 9.3 10*3/uL — ABNORMAL HIGH (ref 1.7–7.7)
Neutrophils Relative %: 69 % (ref 43–77)

## 2014-03-05 LAB — PROTIME-INR
INR: 0.9 (ref 0.00–1.49)
PROTHROMBIN TIME: 12.2 s (ref 11.6–15.2)

## 2014-03-05 LAB — APTT: aPTT: 27 seconds (ref 24–37)

## 2014-03-05 LAB — CBG MONITORING, ED
GLUCOSE-CAPILLARY: 47 mg/dL — AB (ref 70–99)
GLUCOSE-CAPILLARY: 72 mg/dL (ref 70–99)
Glucose-Capillary: 62 mg/dL — ABNORMAL LOW (ref 70–99)

## 2014-03-05 LAB — MAGNESIUM: Magnesium: 2 mg/dL (ref 1.5–2.5)

## 2014-03-05 LAB — ETHANOL: Alcohol, Ethyl (B): <11 mg/dL (ref 0–11)

## 2014-03-05 MED ORDER — DEXTROSE 50 % IV SOLN
1.0000 | Freq: Once | INTRAVENOUS | Status: AC
  Administered 2014-03-05: 50 mL via INTRAVENOUS

## 2014-03-05 MED ORDER — ONDANSETRON HCL 4 MG/2ML IJ SOLN
4.0000 mg | Freq: Once | INTRAMUSCULAR | Status: AC
  Administered 2014-03-05: 4 mg via INTRAVENOUS
  Filled 2014-03-05: qty 2

## 2014-03-05 MED ORDER — DEXTROSE 50 % IV SOLN
1.0000 | Freq: Once | INTRAVENOUS | Status: AC
  Administered 2014-03-05: 50 mL via INTRAVENOUS
  Filled 2014-03-05: qty 50

## 2014-03-05 MED ORDER — DEXTROSE 50 % IV SOLN
INTRAVENOUS | Status: AC
  Administered 2014-03-05: 50 mL via INTRAVENOUS
  Filled 2014-03-05: qty 50

## 2014-03-05 MED ORDER — MORPHINE SULFATE 4 MG/ML IJ SOLN
4.0000 mg | Freq: Once | INTRAMUSCULAR | Status: AC
  Administered 2014-03-05: 4 mg via INTRAVENOUS
  Filled 2014-03-05: qty 1

## 2014-03-05 NOTE — ED Notes (Addendum)
Pt presents from home and reports that she had onset of headache at 1300, took a nap, and awoke at 1715 with right sided facial droop and slurred speech. Pt reports she has had nausea and vomiting x2 days. Pt has right sided facial droop and slurred speech at this time. MD notified, code stroke activated.

## 2014-03-05 NOTE — Consult Note (Addendum)
Neurology Consultation Reason for Consult: Left-sided weakness Referring Physician: Kathrynn Humble, A  CC: Left sided weakness  History is obtained from: Patient  HPI: [name] is a 43 y.o. female with a history of 2 episodes of previous left-sided weakness as well as previously diagnosed with lupus and Sjogren syndrome who presents with left-sided weakness that started on awakening from a nap that began at 1 PM.  She states that her sugar was low prior to the nap, then she drank some juice and laid down. She has a headaceh currently, though was worse earlier and is associated with photophobia. She also had some nausea and vomiting earlier.    LKW: 1 PM tpa given?: no, outside of window    ROS: A 14 point ROS was performed and is negative except as noted in the HPI.   Past Medical History  Diagnosis Date  . Bell's palsy 08/09/2010  . SLE 08/09/2010  . SJOGREN'S SYNDROME 08/09/2010  . Hypertension   . High cholesterol   . Anginal pain   . Obstructive sleep apnea on CPAP 2011  . Anxiety   . GERD (gastroesophageal reflux disease)   . CHF (congestive heart failure)   . Acute renal failure      Intractable nausea vomiting secondary to diabetic gastroparesis causing dehydration and acute renal failure /notes 04/01/2013  . Liver mass     biopsied 03/2013 at [facility], not malignant.  is to undergo radiologic ablation of the mass in sept/October 2014.   Marland Kitchen Pneumonia 2000's    "once" (04/01/2013)  . On home oxygen therapy     "2L 24/7" (04/01/2013)  . Migraines     "maybe a couple times/yr" (04/01/2013)  . Stroke 2010; 10/2012    "left side is still weak from it, never fully regained full strength; no additions from stroke 10/2012"  . Obesity   . Family history of malignant neoplasm of breast   . Family history of malignant neoplasm of ovary   . Lupus   . Chest pain 01/13/2014  . Diabetic gastroparesis associated with type 2 diabetes mellitus     this is presumed diagnoses, not confirmed by  any studies.   . Shortness of breath     Family History: Mother - stroke  Social History: Tob: denies  Exam: Current vital signs: BP 136/61  Pulse 91  Temp(Src) 98.4 F (36.9 C) (Oral)  Resp 20  SpO2 100% Vital signs in last 24 hours: Temp:  [98.4 F (36.9 C)] 98.4 F (36.9 C) (08/05 1812) Pulse Rate:  [91-97] 91 (08/05 1830) Resp:  [19-20] 20 (08/05 1830) BP: (135-147)/(61-75) 136/61 mmHg (08/05 1830) SpO2:  [100 %] 100 % (08/05 1830)  General: in bed, nad CV: RRR Mental Status: Patient is awake, alert, oriented to person, place, month, year, and situation. Immediate and remote memory are intact. Patient is able to give a clear and coherent history. No signs of aphasia or neglect Cranial Nerves: II: Visual Fields are full. Pupils are equal, round, and reactive to light.  Discs are difficult to visualize. III,IV, VI: EOMI without ptosis or diploplia.  V: Facial sensation is decreased on the left. She splits midline to pinprick, but not vibration VII: Facial movement is unusual, she has a flattened right NL fold, and hold her left face taught when she puffs her cheeks. She has ptosis on the left(old bell's palsy). This constellation of symptoms was documented at her previous admission in 2014 and 2010 as well.  VIII: hearing is intact  to voice X: Uvula elevates symmetrically XI: Shoulder shrug is symmetric. XII: tongue is midline without atrophy or fasciculations.  Motor: Tone is normal. Bulk is normal. 5/5 strength was present on the right. She has 4/5 weakness on the left, though is inconsistent, at times drifting and at times not.  Sensory: Sensation is decreased on left hemibody.  Deep Tendon Reflexes: 2+ and symmetric in the biceps and patellae.  Cerebellar: Consistent with weakness on left.  Gait: Not assessed due to acute nature of evaluation and multiple medical monitors in ED setting.   I have reviewed labs in epic and the results pertinent to this  consultation are: cbg 62  I have reviewed the images obtained:CT head - negative.   Impression: 43 yo F with lupus and recurrent episodes of left sided weakness and numbness. Her stay in 2010 which she reported as stroke, according to the discharge summary was non-organic left sided weakness and MRI was negative. In 2013, she was treated with left sided weakness, but MRI could not be done due to her habitus.   Recommendations: 1) MRI brain 2) could consider treating as complex migraine, if symptoms resolved, then would consider this complicated migraine, though non-organic pathology would need to be considered as well.  3) treatment of hypoglycemia per ED physician.   Roland Rack, MD [facility] [phone]  If 7pm- 7am, please page neurology on call as listed in [place].

## 2014-03-05 NOTE — ED Notes (Signed)
CBG results 72 mg/dL reported to Daralene Milch, Therapist, sports

## 2014-03-05 NOTE — ED Provider Notes (Addendum)
CSN: [id number]     Arrival date & time 03/05/14  1729 History   First MD Initiated Contact with Patient 03/05/14 1802     Chief Complaint  Patient presents with  . Code Stroke    An emergency department physician performed an initial assessment on this suspected stroke patient at 6. (Consider location/radiation/quality/duration/timing/severity/associated sxs/prior Treatment) HPI Comments: Pt comes in with cc of headache and left sided numbness. Hx of Sjorgens, SLE, IDDM, Stroke with left sided hemiparesis. PT is last normal at 1 pm. Pt reports that she started having headaces at 1:00 pm, and went to bed, and upon waking up around 5, she had slurred speech, left sided droop and weakness. Pt denies any new meds. No n/v/f/c. No drug use.  The history is provided by the patient and medical records.    Past Medical History  Diagnosis Date  . Bell's palsy 08/09/2010  . SLE 08/09/2010  . SJOGREN'S SYNDROME 08/09/2010  . Hypertension   . High cholesterol   . Anginal pain   . Obstructive sleep apnea on CPAP 2011  . Anxiety   . GERD (gastroesophageal reflux disease)   . CHF (congestive heart failure)   . Acute renal failure      Intractable nausea vomiting secondary to diabetic gastroparesis causing dehydration and acute renal failure /notes 04/01/2013  . Liver mass     biopsied 03/2013 at [facility], not malignant.  is to undergo radiologic ablation of the mass in sept/October 2014.   Marland Kitchen Pneumonia 2000's    "once" (04/01/2013)  . On home oxygen therapy     "2L 24/7" (04/01/2013)  . Migraines     "maybe a couple times/yr" (04/01/2013)  . Stroke 2010; 10/2012    "left side is still weak from it, never fully regained full strength; no additions from stroke 10/2012"  . Obesity   . Family history of malignant neoplasm of breast   . Family history of malignant neoplasm of ovary   . Lupus   . Chest pain 01/13/2014  . Diabetic gastroparesis associated with type 2 diabetes mellitus     this is  presumed diagnoses, not confirmed by any studies.   . Shortness of breath    Past Surgical History  Procedure Laterality Date  . Ectopic pregnancy surgery  1999  . Cardiac catheterization  02/07/12  . Hernia repair  6295'M    umbilical  . Epidermoid cyst excision  2007     left breast  . Esophagogastroduodenoscopy N/A 02/26/2013    Procedure: ESOPHAGOGASTRODUODENOSCOPY (EGD);  Surgeon: Irene Shipper, MD;  Location: [facility] ENDOSCOPY;  Service: Endoscopy;  Laterality: N/A;  . Breast cyst excision Left 2007    epidermoid   Family History  Problem Relation Age of Onset  . Diabetes Mother   . Hypertension Other   . Stroke Other   . Arthritis Other   . Ovarian cancer Sister 70    maternal half-sister; RAD51C positive  . Breast cancer Maternal Aunt 68    currently 65  . Stomach cancer Maternal Uncle     dx 65s; deceased  . Cancer Paternal Aunt     unk. primary; deceased 43s  . Prostate cancer Paternal Uncle     deceased 51s  . Breast cancer Cousin     deceased 62s; daughter of mat uncle with stomach ca  . Prostate cancer Maternal Uncle     deceased 40s  . Breast cancer Maternal Aunt     deceased 78s  . Cancer  Maternal Aunt     unk. primary  . Cancer Maternal Aunt     unk. primary  . Ovarian cancer Maternal Aunt     deceased 55s   History  Substance Use Topics  . Smoking status: Former Smoker -- 3 years    Types: Cigarettes    Quit date: 06/01/1990  . Smokeless tobacco: Never Used  . Alcohol Use: No   OB History   Grav Para Term Preterm Abortions TAB SAB Ect Mult Living   2 1 1  1   1  1      Review of Systems  Constitutional: Positive for activity change.  HENT: Negative for drooling.   Eyes: Negative for visual disturbance.  Respiratory: Negative for chest tightness.   Cardiovascular: Negative for chest pain.  Gastrointestinal: Negative for nausea.  Musculoskeletal: Negative for neck pain.  Allergic/Immunologic: Positive for immunocompromised state.  Neurological:  Positive for speech difficulty, weakness, numbness and headaches.      Allergies  Metoclopramide; Oxycodone-acetaminophen; and Codeine  Home Medications   Prior to Admission medications   Medication Sig Start Date End Date Taking? Authorizing Provider  Albiglutide (TANZEUM) 30 MG PEN Inject 30 mg into the skin once a week. Wednesdays   Yes Historical Provider, MD  albuterol (PROAIR HFA) 108 (90 BASE) MCG/ACT inhaler Inhale 2 puffs into the lungs every 6 (six) hours as needed. For shortness of breath/wheeze   Yes Historical Provider, MD  alendronate (FOSAMAX) 70 MG tablet Take 70 mg by mouth every Saturday. saturday. Take with a full glass of water on an empty stomach.   Yes Historical Provider, MD  ALPRAZolam Duanne Moron) 0.5 MG tablet Take 0.5 mg by mouth 2 (two) times daily as needed for sleep or anxiety.    Yes Historical Provider, MD  aspirin EC 81 MG tablet Take 81 mg by mouth daily.   Yes Historical Provider, MD  azaTHIOprine (IMURAN) 50 MG tablet Take 100 mg by mouth 2 (two) times daily.    Yes Historical Provider, MD  carvedilol (COREG) 3.125 MG tablet Take 3.125 mg by mouth 2 (two) times daily with a meal.   Yes Historical Provider, MD  cetirizine (ZYRTEC) 10 MG tablet Take 10 mg by mouth daily.   Yes Historical Provider, MD  citalopram (CELEXA) 10 MG tablet Take 1 tablet (10 mg total) by mouth every evening. 04/06/13  Yes Belkys A Regalado, MD  clopidogrel (PLAVIX) 75 MG tablet Take 75 mg by mouth daily.   Yes Historical Provider, MD  cycloSPORINE (RESTASIS) 0.05 % ophthalmic emulsion Place 1 drop into both eyes 3 (three) times daily.   Yes Historical Provider, MD  famotidine (PEPCID) 20 MG tablet Take 1 tablet (20 mg total) by mouth at bedtime. 01/15/14  Yes Barton Dubois, MD  gabapentin (NEURONTIN) 300 MG capsule Take 300 mg by mouth 2 (two) times daily.    Yes Historical Provider, MD  HYDROcodone-acetaminophen (NORCO/VICODIN) 5-325 MG per tablet Take 1 tablet by mouth every 6 (six)  hours as needed for moderate pain. 11/18/13  Yes Belkys A Regalado, MD  hydroxychloroquine (PLAQUENIL) 200 MG tablet Take 200 mg by mouth 2 (two) times daily.    Yes Historical Provider, MD  insulin regular human CONCENTRATED (HUMULIN R) 500 UNIT/ML SOLN injection Inject 0.02 mLs (10 Units total) into the skin 3 (three) times daily with meals. Because it is concentrated, please draw up as though it is just 10 units at a time. 11/18/13  Yes Belkys A Regalado, MD  isosorbide-hydrALAZINE (BIDIL)  20-37.5 MG per tablet Take 1 tablet by mouth 3 (three) times daily.   Yes Historical Provider, MD  metFORMIN (GLUCOPHAGE) 500 MG tablet Take 500 mg by mouth 2 (two) times daily with a meal.   Yes Historical Provider, MD  omeprazole (PRILOSEC) 40 MG capsule Take 1 capsule (40 mg total) by mouth daily. 01/15/14  Yes Barton Dubois, MD  potassium chloride SA (K-DUR,KLOR-CON) 20 MEQ tablet Take 20 mEq by mouth 2 (two) times daily.   Yes Historical Provider, MD  predniSONE (DELTASONE) 10 MG tablet Take 6 tablets by mouth daily X 2 days; then 4 tablets by mouth daily X 2 days; then 2 tablets by mouth daily X 2 days; then 1/2 tablet daily X 2 days and then resume your chronic prednisone dose of 63m tablet daily. 01/15/14  Yes CBarton Dubois MD  predniSONE (DELTASONE) 5 MG tablet Take 5 mg by mouth daily with breakfast.   Yes Historical Provider, MD  torsemide (DEMADEX) 20 MG tablet Take 1 tablet (20 mg total) by mouth 2 (two) times daily. 04/06/13  Yes Belkys A Regalado, MD  traZODone (DESYREL) 50 MG tablet Take 1 tablet (50 mg total) by mouth at bedtime. 04/06/13  Yes Belkys A Regalado, MD   BP 121/58  Pulse 94  Temp(Src) 98.8 F (37.1 C) (Oral)  Resp 22  SpO2 100% Physical Exam  Nursing note and vitals reviewed. Constitutional: She is oriented to person, place, and time. She appears well-developed.  Eyes: Conjunctivae are normal.  Neck: Neck supple.  Cardiovascular: Normal rate.   Pulmonary/Chest: Effort normal. No  respiratory distress.  Abdominal: Soft.  Neurological: She is alert and oriented to person, place, and time.  Left sided facial droop - not central sparing. Left sided numbness - subjective. Slurred speech.  NIHSS - deferred to neurology team.    ED Course  Procedures (including critical care time) Labs Review Labs Reviewed  CBC - Abnormal; Notable for the following:    WBC 13.5 (*)    RBC 3.79 (*)    Hemoglobin 10.2 (*)    HCT 32.5 (*)    RDW 16.6 (*)    All other components within normal limits  DIFFERENTIAL - Abnormal; Notable for the following:    Neutro Abs 9.3 (*)    All other components within normal limits  COMPREHENSIVE METABOLIC PANEL - Abnormal; Notable for the following:    Glucose, Bld 57 (*)    Albumin 3.1 (*)    Total Bilirubin <0.2 (*)    GFR calc non Af Amer 63 (*)    GFR calc Af Amer 73 (*)    All other components within normal limits  CBG MONITORING, ED - Abnormal; Notable for the following:    Glucose-Capillary 62 (*)    All other components within normal limits  I-STAT CHEM 8, ED - Abnormal; Notable for the following:    Glucose, Bld 59 (*)    Hemoglobin 11.6 (*)    HCT 34.0 (*)    All other components within normal limits  CBG MONITORING, ED - Abnormal; Notable for the following:    Glucose-Capillary 47 (*)    All other components within normal limits  PROTIME-INR  APTT  ETHANOL  I-STAT TROPOININ, ED  I-STAT TROPOININ, ED  POC URINE PREG, ED  CBG MONITORING, ED  CBG MONITORING, ED    Imaging Review Ct Head (brain) Wo Contrast  03/05/2014   CLINICAL DATA:  Slurred speech, right facial droop  EXAM: CT HEAD WITHOUT CONTRAST  TECHNIQUE: Contiguous axial images were obtained from the base of the skull through the vertex without intravenous contrast.  COMPARISON:  10/11/2012  FINDINGS: No skull fracture is noted. Mucous retention cysts are noted bilateral maxillary sinus. On the right side measures 2.1 cm on the left maxillary sinus measures 1.5  cm. The mastoid air cells are unremarkable.  No intracranial hemorrhage, mass effect or midline shift. No acute cortical infarction. No mass lesion is noted on this unenhanced scan. No hydrocephalus. No intra or extra-axial fluid collection.  IMPRESSION: No definite acute cortical infarction. No intracranial hemorrhage, mass effect or midline shift. Mucous retention cyst bilateral maxillary sinus.   Electronically Signed   By: Lahoma Crocker M.D.   On: 03/05/2014 18:06     EKG Interpretation None      Date: 03/06/2014  Rate: 90  Rhythm: normal sinus rhythm  QRS Axis: normal  Intervals: QT prolonged  ST/T Wave abnormalities: nonspecific ST/T changes  Conduction Disutrbances:none  Narrative Interpretation:   Old EKG Reviewed: unchanged    MDM   Final diagnoses:  Stroke risk  Hypoglycemia  Prolonged QT   Pt comes in with cc of left sided numbness and weakness, and slurred speech. Hx of autoimmune. She also has hx of BELL's PALSY, and the facial droop could be bell's as there was no noticeable central sparing. She however has other left sided deficit and slurred speech.  DDx: -Seizure disorder -Meningitis -Trauma -ICH -Electrolyte abnormality -Metabolic derangement -Stroke -Toxin induced seizures -Medication side effects -Hypoxia -Hypoglycemia  Given the hx of strokes, and autoimmune conditions, code stroke was activated. Pt is outside the TPA window. Neurology to get full evaluation, and NIHSS deferred to them and our RN team.  Pt has persistent hypoglycemia, and also has prolonged QT. She has IDDM. Will admit for full workup.      Varney Biles, MD 03/05/14 4628  Varney Biles, MD 03/06/14 6381

## 2014-03-05 NOTE — ED Notes (Signed)
CBG results reported to Daralene Milch, RN CBG  47 mg/dL

## 2014-03-05 NOTE — ED Notes (Signed)
Pt transported to CT by Romie Minus

## 2014-03-05 NOTE — ED Notes (Signed)
Patient transported to MRI 

## 2014-03-05 NOTE — Code Documentation (Addendum)
43 year old presents to [facility] via private vehicle with c/o right facial droop and left side weakness and slurred speech.  She states she was vomiting today and has had a headache today.  Her blood sugar last night was in the 50's.  Today she went to take a nap at 1300 and her blood sugar was 31 - she ate some peanut butter and laid down.  When she woke up at 1715  she stated her face felt funny and she was weak all over.  She called her nephew to come home and he later brought her to the ED.  Her NIHSS is 08 - right facial droop -left arm and leg drift which she reports is from an old stroke - some dysarthria and sensation loss on left side - she is drowsy.  She denies taking any new meds today.  She has hx CHF and lupus per her report.  CT scan was negative per MD.  Dr. Leonel Ramsay is at the bedside.  She is out of the window for acute treatment.  Difficulty starting IV line - her CBG here is 62 - once IV line in D50W given by Inocencio Homes.  Handoff to Production assistant, radio.

## 2014-03-05 NOTE — ED Notes (Signed)
Pt c/o hurting all over and is requesting pain med.

## 2014-03-05 NOTE — ED Notes (Signed)
Pt st's no relief from pain med.  Pt st's pain remains 10/10

## 2014-03-06 ENCOUNTER — Ambulatory Visit: Payer: Medicaid Other | Admitting: Obstetrics & Gynecology

## 2014-03-06 ENCOUNTER — Inpatient Hospital Stay (HOSPITAL_COMMUNITY): Payer: Medicaid Other

## 2014-03-06 DIAGNOSIS — Z9189 Other specified personal risk factors, not elsewhere classified: Secondary | ICD-10-CM

## 2014-03-06 DIAGNOSIS — R9431 Abnormal electrocardiogram [ECG] [EKG]: Secondary | ICD-10-CM | POA: Diagnosis present

## 2014-03-06 DIAGNOSIS — E162 Hypoglycemia, unspecified: Secondary | ICD-10-CM

## 2014-03-06 DIAGNOSIS — G510 Bell's palsy: Secondary | ICD-10-CM

## 2014-03-06 DIAGNOSIS — E119 Type 2 diabetes mellitus without complications: Secondary | ICD-10-CM

## 2014-03-06 DIAGNOSIS — M6281 Muscle weakness (generalized): Secondary | ICD-10-CM

## 2014-03-06 LAB — CBG MONITORING, ED: Glucose-Capillary: 58 mg/dL — ABNORMAL LOW (ref 70–99)

## 2014-03-06 LAB — GLUCOSE, CAPILLARY
GLUCOSE-CAPILLARY: 62 mg/dL — AB (ref 70–99)
GLUCOSE-CAPILLARY: 109 mg/dL — AB (ref 70–99)
GLUCOSE-CAPILLARY: 95 mg/dL (ref 70–99)
Glucose-Capillary: 105 mg/dL — ABNORMAL HIGH (ref 70–99)
Glucose-Capillary: 85 mg/dL (ref 70–99)
Glucose-Capillary: 98 mg/dL (ref 70–99)
Glucose-Capillary: 143 mg/dL — ABNORMAL HIGH (ref 70–99)
Glucose-Capillary: 141 mg/dL — ABNORMAL HIGH (ref 70–99)

## 2014-03-06 LAB — HEMOGLOBIN A1C
Hgb A1c MFr Bld: 6.2 % — ABNORMAL HIGH (ref <5.7)
MEAN PLASMA GLUCOSE: 131 mg/dL — AB (ref <117)

## 2014-03-06 LAB — FOLATE: Folate: 13.8 ng/mL

## 2014-03-06 LAB — LIPID PANEL
Cholesterol: 160 mg/dL (ref 0–200)
HDL: 57 mg/dL (ref >39)
LDL Cholesterol: 78 mg/dL (ref 0–99)
Total CHOL/HDL Ratio: 2.8 RATIO
Triglycerides: 125 mg/dL (ref <150)
VLDL: 25 mg/dL (ref 0–40)

## 2014-03-06 LAB — VITAMIN B12: Vitamin B-12: 374 pg/mL (ref 211–911)

## 2014-03-06 MED ORDER — PANTOPRAZOLE SODIUM 40 MG PO TBEC: 80.0000 mg | DELAYED_RELEASE_TABLET | Freq: Every day | ORAL | Status: DC

## 2014-03-06 MED ORDER — INSULIN ASPART 100 UNIT/ML ~~LOC~~ SOLN: 10.0000 [IU] | Freq: Three times a day (TID) | SUBCUTANEOUS | Status: DC

## 2014-03-06 MED ORDER — ISOSORB DINITRATE-HYDRALAZINE 20-37.5 MG PO TABS: 1.0000 | ORAL_TABLET | Freq: Three times a day (TID) | ORAL | Status: DC

## 2014-03-06 MED ORDER — ALPRAZOLAM 0.25 MG PO TABS: 0.5000 mg | ORAL_TABLET | Freq: Two times a day (BID) | ORAL | Status: DC | PRN

## 2014-03-06 MED ORDER — METHYLPREDNISOLONE SODIUM SUCC 40 MG IJ SOLR: 4.0000 mg | Freq: Every day | INTRAMUSCULAR | Status: DC

## 2014-03-06 MED ORDER — HEPARIN SODIUM (PORCINE) 5000 UNIT/ML IJ SOLN
5000.0000 [IU] | Freq: Three times a day (TID) | INTRAMUSCULAR | Status: DC
  Administered 2014-03-06 – 2014-03-07 (×4): 5000 [IU] via SUBCUTANEOUS
  Filled 2014-03-06 (×4): qty 1

## 2014-03-06 MED ORDER — GABAPENTIN 300 MG PO CAPS: 300.0000 mg | ORAL_CAPSULE | Freq: Two times a day (BID) | ORAL | Status: DC

## 2014-03-06 MED ORDER — METFORMIN HCL 500 MG PO TABS: 500.0000 mg | ORAL_TABLET | Freq: Two times a day (BID) | ORAL | Status: DC

## 2014-03-06 MED ORDER — AZATHIOPRINE 50 MG PO TABS: 100.0000 mg | ORAL_TABLET | Freq: Two times a day (BID) | ORAL | Status: DC

## 2014-03-06 MED ORDER — TORSEMIDE 20 MG PO TABS
20.0000 mg | ORAL_TABLET | Freq: Two times a day (BID) | ORAL | Status: DC
  Administered 2014-03-06 – 2014-03-07 (×2): 20 mg via ORAL
  Filled 2014-03-06 (×2): qty 1

## 2014-03-06 MED ORDER — INSULIN ASPART 100 UNIT/ML ~~LOC~~ SOLN: 0.0000 [IU] | Freq: Three times a day (TID) | SUBCUTANEOUS | Status: DC

## 2014-03-06 MED ORDER — ASPIRIN EC 81 MG PO TBEC
81.0000 mg | DELAYED_RELEASE_TABLET | Freq: Every day | ORAL | Status: DC
  Administered 2014-03-06 – 2014-03-07 (×2): 81 mg via ORAL
  Filled 2014-03-06 (×2): qty 1

## 2014-03-06 MED ORDER — SODIUM CHLORIDE 0.9 % IV SOLN
80.0000 mg | Freq: Every day | INTRAVENOUS | Status: DC
  Administered 2014-03-06 – 2014-03-07 (×2): 80 mg via INTRAVENOUS
  Filled 2014-03-06 (×4): qty 80

## 2014-03-06 MED ORDER — TORSEMIDE 20 MG PO TABS
20.0000 mg | ORAL_TABLET | Freq: Two times a day (BID) | ORAL | Status: DC
  Filled 2014-03-06 (×2): qty 1

## 2014-03-06 MED ORDER — SODIUM CHLORIDE 0.9 % IJ SOLN
3.0000 mL | Freq: Two times a day (BID) | INTRAMUSCULAR | Status: DC
  Administered 2014-03-06 – 2014-03-07 (×2): 3 mL via INTRAVENOUS

## 2014-03-06 MED ORDER — PREDNISONE 5 MG PO TABS
5.0000 mg | ORAL_TABLET | Freq: Every day | ORAL | Status: DC
  Administered 2014-03-07: 5 mg via ORAL
  Filled 2014-03-06: qty 1

## 2014-03-06 MED ORDER — CARVEDILOL 3.125 MG PO TABS
3.1250 mg | ORAL_TABLET | Freq: Two times a day (BID) | ORAL | Status: DC
  Filled 2014-03-06: qty 1

## 2014-03-06 MED ORDER — ALBUTEROL SULFATE (2.5 MG/3ML) 0.083% IN NEBU: 3.0000 mL | INHALATION_SOLUTION | Freq: Four times a day (QID) | RESPIRATORY_TRACT | Status: DC | PRN

## 2014-03-06 MED ORDER — HYDROCODONE-ACETAMINOPHEN 5-325 MG PO TABS
1.0000 | ORAL_TABLET | Freq: Four times a day (QID) | ORAL | Status: DC | PRN
Start: 2014-03-06 — End: 2014-03-06

## 2014-03-06 MED ORDER — LORATADINE 10 MG PO TABS
10.0000 mg | ORAL_TABLET | Freq: Every day | ORAL | Status: DC
  Administered 2014-03-06 – 2014-03-07 (×2): 10 mg via ORAL
  Filled 2014-03-06 (×2): qty 1

## 2014-03-06 MED ORDER — CITALOPRAM HYDROBROMIDE 10 MG PO TABS
10.0000 mg | ORAL_TABLET | Freq: Every evening | ORAL | Status: DC
  Administered 2014-03-06: 10 mg via ORAL
  Filled 2014-03-06: qty 1

## 2014-03-06 MED ORDER — PREDNISONE 5 MG PO TABS
5.0000 mg | ORAL_TABLET | Freq: Every day | ORAL | Status: DC
  Filled 2014-03-06: qty 1

## 2014-03-06 MED ORDER — POTASSIUM CHLORIDE CRYS ER 20 MEQ PO TBCR: 20.0000 meq | EXTENDED_RELEASE_TABLET | Freq: Two times a day (BID) | ORAL | Status: DC

## 2014-03-06 MED ORDER — CARVEDILOL 3.125 MG PO TABS
3.1250 mg | ORAL_TABLET | Freq: Two times a day (BID) | ORAL | Status: DC
  Administered 2014-03-06 – 2014-03-07 (×2): 3.125 mg via ORAL
  Filled 2014-03-06 (×2): qty 1

## 2014-03-06 MED ORDER — ASPIRIN 300 MG RE SUPP: 300.0000 mg | Freq: Every day | RECTAL | Status: DC

## 2014-03-06 MED ORDER — CLOPIDOGREL BISULFATE 75 MG PO TABS
75.0000 mg | ORAL_TABLET | Freq: Every day | ORAL | Status: DC
  Administered 2014-03-06 – 2014-03-07 (×2): 75 mg via ORAL
  Filled 2014-03-06 (×2): qty 1

## 2014-03-06 MED ORDER — DEXTROSE 50 % IV SOLN
25.0000 mL | Freq: Once | INTRAVENOUS | Status: AC | PRN
  Administered 2014-03-06: 25 mL via INTRAVENOUS
  Filled 2014-03-06: qty 50

## 2014-03-06 MED ORDER — HYDROXYCHLOROQUINE SULFATE 200 MG PO TABS
200.0000 mg | ORAL_TABLET | Freq: Two times a day (BID) | ORAL | Status: DC
  Administered 2014-03-06 – 2014-03-07 (×3): 200 mg via ORAL
  Filled 2014-03-06 (×3): qty 1

## 2014-03-06 MED ORDER — AZATHIOPRINE 50 MG PO TABS
100.0000 mg | ORAL_TABLET | Freq: Two times a day (BID) | ORAL | Status: DC
  Administered 2014-03-06 – 2014-03-07 (×3): 100 mg via ORAL
  Filled 2014-03-06 (×4): qty 2

## 2014-03-06 MED ORDER — SODIUM CHLORIDE 0.9 % IV SOLN
INTRAVENOUS | Status: DC
  Administered 2014-03-06 (×2): via INTRAVENOUS

## 2014-03-06 MED ORDER — CITALOPRAM HYDROBROMIDE 10 MG PO TABS: 10.0000 mg | ORAL_TABLET | Freq: Every evening | ORAL | Status: DC

## 2014-03-06 MED ORDER — TRAZODONE HCL 50 MG PO TABS
50.0000 mg | ORAL_TABLET | Freq: Every day | ORAL | Status: DC
  Administered 2014-03-06: 50 mg via ORAL
  Filled 2014-03-06: qty 1

## 2014-03-06 MED ORDER — FAMOTIDINE 20 MG PO TABS: 20.0000 mg | ORAL_TABLET | Freq: Every day | ORAL | Status: DC

## 2014-03-06 MED ORDER — DEXTROSE 50 % IV SOLN: 25.0000 mL | Freq: Once | INTRAVENOUS | Status: AC | PRN

## 2014-03-06 MED ORDER — ONDANSETRON HCL 4 MG/2ML IJ SOLN
4.0000 mg | Freq: Four times a day (QID) | INTRAMUSCULAR | Status: DC | PRN
  Administered 2014-03-06 – 2014-03-07 (×2): 4 mg via INTRAVENOUS
  Filled 2014-03-06 (×2): qty 2

## 2014-03-06 MED ORDER — TRAZODONE HCL 50 MG PO TABS: 50.0000 mg | ORAL_TABLET | Freq: Every day | ORAL | Status: DC

## 2014-03-06 MED ORDER — ALBUTEROL SULFATE HFA 108 (90 BASE) MCG/ACT IN AERS: 2.0000 | INHALATION_SPRAY | Freq: Four times a day (QID) | RESPIRATORY_TRACT | Status: DC | PRN

## 2014-03-06 MED ORDER — ALPRAZOLAM 0.5 MG PO TABS: 0.5000 mg | ORAL_TABLET | Freq: Two times a day (BID) | ORAL | Status: DC | PRN

## 2014-03-06 MED ORDER — GLUCOSE 40 % PO GEL: 1.0000 | ORAL | Status: DC | PRN

## 2014-03-06 MED ORDER — GLUCOSE-VITAMIN C 4-6 GM-MG PO CHEW: 4.0000 | CHEWABLE_TABLET | ORAL | Status: DC | PRN

## 2014-03-06 MED ORDER — DEXTROSE 50 % IV SOLN
INTRAVENOUS | Status: AC
  Administered 2014-03-06: 50 mL
  Filled 2014-03-06: qty 50

## 2014-03-06 MED ORDER — CLOPIDOGREL BISULFATE 75 MG PO TABS: 75.0000 mg | ORAL_TABLET | Freq: Every day | ORAL | Status: DC

## 2014-03-06 MED ORDER — ALBIGLUTIDE 30 MG ~~LOC~~ PEN: 30.0000 mg | PEN_INJECTOR | SUBCUTANEOUS | Status: DC

## 2014-03-06 MED ORDER — ALBUTEROL SULFATE (2.5 MG/3ML) 0.083% IN NEBU: 2.5000 mg | INHALATION_SOLUTION | Freq: Four times a day (QID) | RESPIRATORY_TRACT | Status: DC | PRN

## 2014-03-06 MED ORDER — DEXTROSE 5 % IV SOLN
200.0000 mg | INTRAVENOUS | Status: DC
  Filled 2014-03-06: qty 200

## 2014-03-06 MED ORDER — ASPIRIN EC 81 MG PO TBEC: 81.0000 mg | DELAYED_RELEASE_TABLET | Freq: Every day | ORAL | Status: DC

## 2014-03-06 MED ORDER — HYDROXYCHLOROQUINE SULFATE 200 MG PO TABS: 200.0000 mg | ORAL_TABLET | Freq: Two times a day (BID) | ORAL | Status: DC

## 2014-03-06 MED ORDER — MORPHINE SULFATE 2 MG/ML IJ SOLN
2.0000 mg | INTRAMUSCULAR | Status: DC | PRN
  Administered 2014-03-06 – 2014-03-07 (×4): 2 mg via INTRAVENOUS
  Filled 2014-03-06 (×4): qty 1

## 2014-03-06 MED ORDER — CYCLOSPORINE 0.05 % OP EMUL
1.0000 [drp] | Freq: Three times a day (TID) | OPHTHALMIC | Status: DC
  Administered 2014-03-06 – 2014-03-07 (×3): 1 [drp] via OPHTHALMIC
  Filled 2014-03-06 (×6): qty 1

## 2014-03-06 NOTE — ED Notes (Signed)
Second stroke swallow screen attempted per Dr. Alcario Drought request.  Pt failed second time

## 2014-03-06 NOTE — ED Notes (Signed)
Pt requesting more pain meds

## 2014-03-06 NOTE — Progress Notes (Signed)
Inpatient Diabetes Program Recommendations  AACE/ADA: New Consensus Statement on Inpatient Glycemic Control (2013)  Target Ranges:  Prepandial:   less than 140 mg/dL      Peak postprandial:   less than 180 mg/dL (1-2 hours)      Critically ill patients:  140 - 180 mg/dL   Reason for Assessment: low blood sugars  Diabetes history: yes Outpatient Diabetes medications: U500 10 units 3x/day with meals (this is the equivalent to 50 units of U100), Glucophage 500mg  bid Current orders for Inpatient glycemic control: none  Despite ideal blood sugar control now, will likely require insulin on this admission.  Patient is willing to bring U500 insulin from home if requested.    Gentry Fitz, RN, BA, MHA, CDE Diabetes Coordinator Inpatient Diabetes Program  [phone] (Team Pager) [phone] [facility] Office) 03/06/2014 12:23 PM

## 2014-03-06 NOTE — Evaluation (Signed)
Clinical/Bedside Swallow Evaluation Patient Details  Name: [name] MRN: [id number] Date of Birth: [birth year]  Today's Date: 03/06/2014 Time: 1025-8527 SLP Time Calculation (min): 26 min  Past Medical History:  Past Medical History  Diagnosis Date  . Bell's palsy 08/09/2010  . SLE 08/09/2010  . SJOGREN'S SYNDROME 08/09/2010  . Hypertension   . High cholesterol   . Anginal pain   . Obstructive sleep apnea on CPAP 2011  . Anxiety   . GERD (gastroesophageal reflux disease)   . CHF (congestive heart failure)   . Acute renal failure      Intractable nausea vomiting secondary to diabetic gastroparesis causing dehydration and acute renal failure /notes 04/01/2013  . Liver mass     biopsied 03/2013 at [facility], not malignant.  is to undergo radiologic ablation of the mass in sept/October 2014.   Marland Kitchen Pneumonia 2000's    "once" (04/01/2013)  . On home oxygen therapy     "2L 24/7" (04/01/2013)  . Migraines     "maybe a couple times/yr" (04/01/2013)  . Stroke 2010; 10/2012    "left side is still weak from it, never fully regained full strength; no additions from stroke 10/2012"  . Obesity   . Family history of malignant neoplasm of breast   . Family history of malignant neoplasm of ovary   . Lupus   . Chest pain 01/13/2014  . Diabetic gastroparesis associated with type 2 diabetes mellitus     this is presumed diagnoses, not confirmed by any studies.   . Shortness of breath    Past Surgical History:  Past Surgical History  Procedure Laterality Date  . Ectopic pregnancy surgery  1999  . Cardiac catheterization  02/07/12  . Hernia repair  7824'M    umbilical  . Epidermoid cyst excision  2007     left breast  . Esophagogastroduodenoscopy N/A 02/26/2013    Procedure: ESOPHAGOGASTRODUODENOSCOPY (EGD);  Surgeon: Irene Shipper, MD;  Location: [facility] ENDOSCOPY;  Service: Endoscopy;  Laterality: N/A;  . Breast cyst excision Left 2007    epidermoid   HPI:  [name] is a 43 y.o. female who presents  to the ED with c/o increased left sided weakness. PMH significant for sjogren's syndrome, GERD, CHF, PNA ("years ago"), CVA, SOB, and gastroparesis.   Assessment / Plan / Recommendation Clinical Impression  Pt demonstrates decreased airway protection with thin liquids, as evidenced by immediate cough. Oral phase is impacted by left-sided oral weakness and reduced ROM, although pt compensates by utilizing right side with Mod I. Pt did not exhibit overt signs of pharyngeal dysphagia with solids or nectar thick liquids. Pt did c/o globus sensation, reporting that this has been an ongoing problem PTA. MD may wish to pursue further esophageal work up. Recommend to initiate Dys 1 textures and nectar thick liquids to facilitate oral clearance and decrease aspiration risk.    Aspiration Risk  Moderate    Diet Recommendation Dysphagia 1 (Puree);Nectar-thick liquid   Liquid Administration via: Cup Medication Administration: Whole meds with puree ((as tolerated-may need to crush)) Supervision: Patient able to self feed;Intermittent supervision to cue for compensatory strategies Compensations: Slow rate;Small sips/bites;Follow solids with liquid Postural Changes and/or Swallow Maneuvers: Seated upright 90 degrees;Upright 30-60 min after meal    Other  Recommendations Recommended Consults: Consider esophageal assessment Oral Care Recommendations: Oral care BID Other Recommendations: Order thickener from pharmacy;Prohibited food (jello, ice cream, thin soups);Remove water pitcher   Follow Up Recommendations  Home health SLP    Frequency  and Duration min 2x/week  1 week   Pertinent Vitals/Pain n/a    SLP Swallow Goals     Swallow Study Prior Functional Status  Type of Home: House Available Help at Discharge: Available PRN/intermittently;Family    General Date of Onset: 03/05/14 (appears to have a more chronic element) HPI: [name] is a 43 y.o. female who presents to the ED with c/o  increased left sided weakness. PMH significant for sjogren's syndrome, GERD, CHF, PNA ("years ago"), CVA, SOB, and gastroparesis. Type of Study: Bedside swallow evaluation Previous Swallow Assessment: BSE 10/10/12 recommending Dys 1 and thin Diet Prior to this Study: NPO Temperature Spikes Noted: No Respiratory Status: Nasal cannula (2L) History of Recent Intubation: No Behavior/Cognition: Alert;Cooperative;Pleasant mood Oral Cavity - Dentition: Adequate natural dentition Self-Feeding Abilities: Able to feed self Patient Positioning: Upright in chair Baseline Vocal Quality: Low vocal intensity Volitional Cough: Strong    Oral/Motor/Sensory Function Overall Oral Motor/Sensory Function: Impaired at baseline (she believes she is more impaired than her baseline) Labial ROM: Reduced left Labial Symmetry: Abnormal symmetry left Labial Strength: Reduced Lingual ROM: Reduced left (? etiology-inconsistent with Left weakness) Lingual Symmetry: Abnormal symmetry left Lingual Strength: Reduced Facial ROM: Reduced left Facial Symmetry: Left droop Facial Strength: Reduced   Ice Chips Ice chips: Within functional limits Presentation: Spoon   Thin Liquid Thin Liquid: Impaired Presentation: Cup;Self Fed Oral Phase Impairments: Reduced labial seal;Reduced lingual movement/coordination Oral Phase Functional Implications: Prolonged oral transit Pharyngeal  Phase Impairments: Cough - Immediate    Nectar Thick Nectar Thick Liquid: Impaired Presentation: Cup;Self Fed;Straw Oral Phase Impairments: Reduced labial seal;Impaired anterior to posterior transit;Reduced lingual movement/coordination Oral phase functional implications: Prolonged oral transit   Honey Thick Honey Thick Liquid: Not tested   Puree Puree: Impaired Presentation: Self Fed;Spoon Oral Phase Impairments: Reduced labial seal;Reduced lingual movement/coordination;Impaired anterior to posterior transit Oral Phase Functional Implications:  Prolonged oral transit   Solid   GO    Solid: Impaired Presentation: Self Fed Oral Phase Impairments: Reduced lingual movement/coordination;Impaired anterior to posterior transit;Impaired mastication      Germain Osgood, M.A. CCC-SLP [phone]  Germain Osgood 03/06/2014,2:24 PM

## 2014-03-06 NOTE — Evaluation (Signed)
Physical Therapy Evaluation Patient Details Name: [name] MRN: [id number] DOB: 03-Jan-1971 Today's Date: 03/06/2014   History of Present Illness  [name] is a 43 y.o. female who presents to the ED with c/o new left sided weakness.  Clinical Impression  Pt admitted with L sided weakness. Pt currently with functional limitations due to the deficits listed below (see PT Problem List).  Pt will benefit from skilled PT to increase their independence and safety with mobility to allow discharge to the venue listed below. Pt. Would also benefit from [facility] referral if pt qualifies.    Follow Up Recommendations Home health PT;Other (comment);Supervision - Intermittent [facility] referral if pt. qualifies)    Equipment Recommendations  None recommended by PT    Recommendations for Other Services       Precautions / Restrictions Precautions Precautions: Fall Restrictions Weight Bearing Restrictions: No      Mobility  Bed Mobility Overal bed mobility: Modified Independent             General bed mobility comments: pt. utilized rail and HOB elevated  Transfers Overall transfer level: Needs assistance Equipment used: None Transfers: Sit to/from Stand Sit to Stand: Supervision            Ambulation/Gait Ambulation/Gait assistance: Supervision Ambulation Distance (Feet): 5 Feet Assistive device:  (IV pole) Gait Pattern/deviations: Step-to pattern Gait velocity: decreased Gait velocity interpretation: Below normal speed for age/gender    Stairs            Wheelchair Mobility    Modified Rankin (Stroke Patients Only) Modified Rankin (Stroke Patients Only) Pre-Morbid Rankin Score: Slight disability Modified Rankin: Slight disability     Balance Overall balance assessment: Needs assistance Sitting-balance support: No upper extremity supported;Feet supported Sitting balance-Leahy Scale: Good     Standing balance support: Single extremity supported;During  functional activity Standing balance-Leahy Scale: Fair                               Pertinent Vitals/Pain Pain Assessment: 0-10 Pain Score:  (did not rate) Pain Location: "I have lupus, my joints hurt." Pain Descriptors / Indicators: Constant Pain Intervention(s): Limited activity within patient's tolerance;Monitored during session;Other (comment) (RN gave pain meds after session)    Home Living Family/patient expects to be discharged to:: Private residence Living Arrangements: Alone Available Help at Discharge: Available PRN/intermittently;Family Type of Home: House Home Access: Stairs to enter Entrance Stairs-Rails: Right Entrance Stairs-Number of Steps: 3 Home Layout: One level Home Equipment: [place] - 4 wheels;Bedside commode;Shower seat;Cane - quad Additional Comments: Pt has an aide that comes 5x/week for a couple hours each day to assist with cooking, cleaning, getting in/out of shower.    Prior Function Level of Independence: Needs assistance   Gait / Transfers Assistance Needed: mostly uses a QC due to O2 tank - says rollator is too much for her to handle with the O2 tank as well.  ADL's / Homemaking Assistance Needed: Aide 5x/day 2hrs/day. Assists with set-up for bathing, housekeeping, doing hair.        Hand Dominance   Dominant Hand: Right    Extremity/Trunk Assessment   Upper Extremity Assessment: Generalized weakness           Lower Extremity Assessment: Overall WFL for tasks assessed      Cervical / Trunk Assessment: Kyphotic  Communication   Communication: Expressive difficulties (slurred speech)  Cognition Arousal/Alertness: Lethargic;Suspect due to medications Behavior During Therapy: [facility] for  tasks assessed/performed Overall Cognitive Status: Within Functional Limits for tasks assessed                      General Comments      Exercises        Assessment/Plan    PT Assessment Patient needs continued PT  services  PT Diagnosis Difficulty walking;Acute pain   PT Problem List Decreased strength;Decreased activity tolerance;Decreased balance;Decreased mobility;Decreased cognition;Decreased knowledge of use of DME;Decreased safety awareness;Decreased knowledge of precautions;Obesity;Pain  PT Treatment Interventions DME instruction;Gait training;Stair training;Functional mobility training;Therapeutic activities;Therapeutic exercise;Balance training;Neuromuscular re-education;Patient/family education   PT Goals (Current goals can be found in the Care Plan section) Acute Rehab PT Goals Patient Stated Goal: to go home, stay out of hospital PT Goal Formulation: With patient Time For Goal Achievement: 03/13/14 Potential to Achieve Goals: Good    Frequency Min 3X/week   Barriers to discharge Decreased caregiver support nephew available PRN    Co-evaluation               End of Session Equipment Utilized During Treatment: Gait belt;Oxygen Activity Tolerance: Patient tolerated treatment well;Patient limited by lethargy Patient left: in chair;with call bell/phone within reach;Other (comment) (SLP in room) Nurse Communication: Mobility status         Time: 0160-1093 PT Time Calculation (min): 21 min   Charges:   PT Evaluation $Initial PT Evaluation Tier I: 1 Procedure PT Treatments $Therapeutic Activity: 8-22 mins   PT G Codes:          Siri Cole 03/06/2014, 1:43 PM  Laureen Abrahams, PT, DPT [phone]

## 2014-03-06 NOTE — ED Notes (Signed)
CBG results 58 mg/dL reported to [employer]

## 2014-03-06 NOTE — Progress Notes (Signed)
Patient transferred to 4N23, alert and oriented. NAD noted. Patient oriented to room and equipment. Initial assessment completed. O2 is 100% on 2L/min. CBG is 62. Will give a dose of D50 and continue to monitor.

## 2014-03-06 NOTE — Progress Notes (Signed)
BP 117/50  Pulse 87  Temp(Src) 98.2 F (36.8 C) (Oral)  Resp 20  Ht 5\' 8"  (1.727 m)  Wt 181.7 kg (400 lb 9.2 oz)  BMI 60.92 kg/m2  SpO2 100% - Agree with plan as late as above. - Patient failed swallowing evaluation continue n.p.o. - MRI is pending. - No further episodes of hypoglycemia which could be contributing to her strokelike symptoms. She also has had cerebral palsy for the past week.

## 2014-03-06 NOTE — Progress Notes (Signed)
CBG recheck is 109. Will continue to monitor.

## 2014-03-06 NOTE — H&P (Addendum)
[facility] History and Physical  Sevin Farone EVO:[id number] DOB: 1971-07-11 DOA: 03/05/2014  Referring physician: EDP PCP: Elyn Peers, MD   Chief Complaint: Left sided weakness   HPI: [name] is a 43 y.o. female who presents to the ED with c/o new left sided weakness.  Symptoms onset at 1 pm today when she woke up from a nap.  She took her sugar before the nap and it was low, drank juice, laid down and had nap.  Has headache currently although this was worse earlier.  Did also have some N/V earlier.  Patient has a history of a chronic left sided bells palsy, neurologic exam in the ED was very "inconsistent".  Non-organic cause of weakness is suspected but neurology would like MRI brain to confirm.  Review of Systems: Systems reviewed.  As above, otherwise negative  Past Medical History  Diagnosis Date  . Bell's palsy 08/09/2010  . SLE 08/09/2010  . SJOGREN'S SYNDROME 08/09/2010  . Hypertension   . High cholesterol   . Anginal pain   . Obstructive sleep apnea on CPAP 2011  . Anxiety   . GERD (gastroesophageal reflux disease)   . CHF (congestive heart failure)   . Acute renal failure      Intractable nausea vomiting secondary to diabetic gastroparesis causing dehydration and acute renal failure /notes 04/01/2013  . Liver mass     biopsied 03/2013 at [facility], not malignant.  is to undergo radiologic ablation of the mass in sept/October 2014.   Marland Kitchen Pneumonia 2000's    "once" (04/01/2013)  . On home oxygen therapy     "2L 24/7" (04/01/2013)  . Migraines     "maybe a couple times/yr" (04/01/2013)  . Stroke 2010; 10/2012    "left side is still weak from it, never fully regained full strength; no additions from stroke 10/2012"  . Obesity   . Family history of malignant neoplasm of breast   . Family history of malignant neoplasm of ovary   . Lupus   . Chest pain 01/13/2014  . Diabetic gastroparesis associated with type 2 diabetes mellitus     this is presumed diagnoses, not  confirmed by any studies.   . Shortness of breath    Past Surgical History  Procedure Laterality Date  . Ectopic pregnancy surgery  1999  . Cardiac catheterization  02/07/12  . Hernia repair  2993'Z    umbilical  . Epidermoid cyst excision  2007     left breast  . Esophagogastroduodenoscopy N/A 02/26/2013    Procedure: ESOPHAGOGASTRODUODENOSCOPY (EGD);  Surgeon: Irene Shipper, MD;  Location: [facility] ENDOSCOPY;  Service: Endoscopy;  Laterality: N/A;  . Breast cyst excision Left 2007    epidermoid   Social History:  reports that she quit smoking about 23 years ago. Her smoking use included Cigarettes. She smoked 0.00 packs per day for 3 years. She has never used smokeless tobacco. She reports that she does not drink alcohol or use illicit drugs.  Allergies  Allergen Reactions  . Metoclopramide Other (See Comments)    Developed restless leg, akathisia type limb movements.   . Oxycodone-Acetaminophen Hives    FYI: no reaction to hydrocodone; tolerates dilaudid  . Codeine Other (See Comments)    (Derm)    Family History  Problem Relation Age of Onset  . Diabetes Mother   . Hypertension Other   . Stroke Other   . Arthritis Other   . Ovarian cancer Sister 88    maternal half-sister; RAD51C positive  .  Breast cancer Maternal Aunt 55    currently 25  . Stomach cancer Maternal Uncle     dx 57s; deceased  . Cancer Paternal Aunt     unk. primary; deceased 29s  . Prostate cancer Paternal Uncle     deceased 19s  . Breast cancer Cousin     deceased 46s; daughter of mat uncle with stomach ca  . Prostate cancer Maternal Uncle     deceased 69s  . Breast cancer Maternal Aunt     deceased 2s  . Cancer Maternal Aunt     unk. primary  . Cancer Maternal Aunt     unk. primary  . Ovarian cancer Maternal Aunt     deceased 17s     Prior to Admission medications   Medication Sig Start Date End Date Taking? Authorizing Provider  Albiglutide (TANZEUM) 30 MG PEN Inject 30 mg into the skin once a  week. Wednesdays   Yes Historical Provider, MD  albuterol (PROAIR HFA) 108 (90 BASE) MCG/ACT inhaler Inhale 2 puffs into the lungs every 6 (six) hours as needed. For shortness of breath/wheeze   Yes Historical Provider, MD  alendronate (FOSAMAX) 70 MG tablet Take 70 mg by mouth every Saturday. saturday. Take with a full glass of water on an empty stomach.   Yes Historical Provider, MD  ALPRAZolam Duanne Moron) 0.5 MG tablet Take 0.5 mg by mouth 2 (two) times daily as needed for sleep or anxiety.    Yes Historical Provider, MD  aspirin EC 81 MG tablet Take 81 mg by mouth daily.   Yes Historical Provider, MD  azaTHIOprine (IMURAN) 50 MG tablet Take 100 mg by mouth 2 (two) times daily.    Yes Historical Provider, MD  carvedilol (COREG) 3.125 MG tablet Take 3.125 mg by mouth 2 (two) times daily with a meal.   Yes Historical Provider, MD  cetirizine (ZYRTEC) 10 MG tablet Take 10 mg by mouth daily.   Yes Historical Provider, MD  citalopram (CELEXA) 10 MG tablet Take 1 tablet (10 mg total) by mouth every evening. 04/06/13  Yes Belkys A Regalado, MD  clopidogrel (PLAVIX) 75 MG tablet Take 75 mg by mouth daily.   Yes Historical Provider, MD  cycloSPORINE (RESTASIS) 0.05 % ophthalmic emulsion Place 1 drop into both eyes 3 (three) times daily.   Yes Historical Provider, MD  famotidine (PEPCID) 20 MG tablet Take 1 tablet (20 mg total) by mouth at bedtime. 01/15/14  Yes Barton Dubois, MD  gabapentin (NEURONTIN) 300 MG capsule Take 300 mg by mouth 2 (two) times daily.    Yes Historical Provider, MD  HYDROcodone-acetaminophen (NORCO/VICODIN) 5-325 MG per tablet Take 1 tablet by mouth every 6 (six) hours as needed for moderate pain. 11/18/13  Yes Belkys A Regalado, MD  hydroxychloroquine (PLAQUENIL) 200 MG tablet Take 200 mg by mouth 2 (two) times daily.    Yes Historical Provider, MD  insulin regular human CONCENTRATED (HUMULIN R) 500 UNIT/ML SOLN injection Inject 0.02 mLs (10 Units total) into the skin 3 (three) times daily  with meals. Because it is concentrated, please draw up as though it is just 10 units at a time. 11/18/13  Yes Belkys A Regalado, MD  isosorbide-hydrALAZINE (BIDIL) 20-37.5 MG per tablet Take 1 tablet by mouth 3 (three) times daily.   Yes Historical Provider, MD  metFORMIN (GLUCOPHAGE) 500 MG tablet Take 500 mg by mouth 2 (two) times daily with a meal.   Yes Historical Provider, MD  omeprazole (PRILOSEC) 40 MG capsule Take  1 capsule (40 mg total) by mouth daily. 01/15/14  Yes Barton Dubois, MD  potassium chloride SA (K-DUR,KLOR-CON) 20 MEQ tablet Take 20 mEq by mouth 2 (two) times daily.   Yes Historical Provider, MD  predniSONE (DELTASONE) 5 MG tablet Take 5 mg by mouth daily with breakfast.   Yes Historical Provider, MD  torsemide (DEMADEX) 20 MG tablet Take 1 tablet (20 mg total) by mouth 2 (two) times daily. 04/06/13  Yes Belkys A Regalado, MD  traZODone (DESYREL) 50 MG tablet Take 1 tablet (50 mg total) by mouth at bedtime. 04/06/13  Yes Belkys A Regalado, MD   Physical Exam: Filed Vitals:   03/05/14 2310  BP: 127/66  Pulse: 91  Temp:   Resp: 18    BP 127/66  Pulse 91  Temp(Src) 98.8 F (37.1 C) (Oral)  Resp 18  SpO2 100%  General Appearance:    Alert, oriented, no distress, appears stated age  Head:    Normocephalic, atraumatic  Eyes:    PERRL, EOMI, sclera non-icteric        Nose:   Nares without drainage or epistaxis. Mucosa, turbinates normal  Throat:   Moist mucous membranes. Oropharynx without erythema or exudate.  Neck:   Supple. No carotid bruits.  No thyromegaly.  No lymphadenopathy.   Back:     No CVA tenderness, no spinal tenderness  Lungs:     Clear to auscultation bilaterally, without wheezes, rhonchi or rales  Chest wall:    No tenderness to palpitation  Heart:    Regular rate and rhythm without murmurs, gallops, rubs  Abdomen:     Soft, non-tender, nondistended, normal bowel sounds, no organomegaly  Genitalia:    deferred  Rectal:    deferred  Extremities:   No  clubbing, cyanosis or edema.  Pulses:   2+ and symmetric all extremities  Skin:   Skin color, texture, turgor normal, no rashes or lesions  Lymph nodes:   Cervical, supraclavicular, and axillary nodes normal  Neurologic:   L sided facial droop and ptosis present c/w her known history of bells palsey on prior admits.  Effort in the LUE is inconsistent.    Labs on Admission:  Basic Metabolic Panel:  Recent Labs Lab 03/05/14 1800 03/05/14 1831 03/05/14 2259  NA 140 142  --   K 4.0 3.7  --   CL 104 105  --   CO2 25  --   --   GLUCOSE 57* 59*  --   BUN 10 9  --   CREATININE 1.06 1.10  --   CALCIUM 8.8  --   --   MG  --   --  2.0   Liver Function Tests:  Recent Labs Lab 03/05/14 1800  AST 13  ALT 9  ALKPHOS 91  BILITOT <0.2*  PROT 6.9  ALBUMIN 3.1*   No results found for this basename: LIPASE, AMYLASE,  in the last 168 hours No results found for this basename: AMMONIA,  in the last 168 hours CBC:  Recent Labs Lab 03/05/14 1800 03/05/14 1831  WBC 13.5*  --   NEUTROABS 9.3*  --   HGB 10.2* 11.6*  HCT 32.5* 34.0*  MCV 85.8  --   PLT 343  --    Cardiac Enzymes: No results found for this basename: CKTOTAL, CKMB, CKMBINDEX, TROPONINI,  in the last 168 hours  BNP (last 3 results)  Recent Labs  04/01/13 1228 04/18/13 2235 01/13/14 0312  PROBNP 29.6 231.5* 19.2   CBG:  Recent Labs Lab 03/05/14 1807 03/05/14 2209 03/05/14 2308  GLUCAP 2* 21* 72    Radiological Exams on Admission: Ct Head (brain) Wo Contrast  03/05/2014   CLINICAL DATA:  Slurred speech, right facial droop  EXAM: CT HEAD WITHOUT CONTRAST  TECHNIQUE: Contiguous axial images were obtained from the base of the skull through the vertex without intravenous contrast.  COMPARISON:  10/11/2012  FINDINGS: No skull fracture is noted. Mucous retention cysts are noted bilateral maxillary sinus. On the right side measures 2.1 cm on the left maxillary sinus measures 1.5 cm. The mastoid air cells are  unremarkable.  No intracranial hemorrhage, mass effect or midline shift. No acute cortical infarction. No mass lesion is noted on this unenhanced scan. No hydrocephalus. No intra or extra-axial fluid collection.  IMPRESSION: No definite acute cortical infarction. No intracranial hemorrhage, mass effect or midline shift. Mucous retention cyst bilateral maxillary sinus.   Electronically Signed   By: Lahoma Crocker M.D.   On: 03/05/2014 18:06    EKG: Independently reviewed.  Assessment/Plan Principal Problem:   Left-sided weakness Active Problems:   Bell's palsy   DM (diabetes mellitus)   Hypoglycemia   QT prolongation   1. Left sided weakness - Neuro exam is inconsistent, MRI brain ordered to rule out stroke, most likely non-organic in nature.  Since she is NPO, will stop plavix and put her on ASA 300 suppositories daily until able to take PO meds again. 2. Chronic pain - Do not plan on using additional IV narcotics at this time due to her QT prolongation.  Discussed this with patient. 3. Bell's Palsy - noted on prior admissions, chronic 4. Hypoglycemia - patient on hypoglycemic protocol, have stopped her 10 unit TID WC insulin, holding metformin as she is NPO. 5. QT prolongation - getting Mg in ED, on tele monitor, avoid further meds that can cause QT prolongation. 6. Lupus - continue home meds as able - converting prednisone to solumedrol IV since she is NPO, pharm consult to try and convert other meds to IV if possible.    Code Status: Full  Family Communication: No family in room Disposition Plan: Admit to obs   Time spent: 70 min  GARDNER, JARED M. [facility] Pager [phone]  If 7AM-7PM, please contact the day team taking care of the patient [email or web address] Password [email or web address] 03/06/2014, 12:15 AM

## 2014-03-06 NOTE — Progress Notes (Signed)
Utilization review completed. Heidee Audi, RN, BSN. 

## 2014-03-06 NOTE — Progress Notes (Signed)
43yo female w/ h/o CVA and chronic Bell's palsy c/o left-sided weakness, MRI pending, to be NPO for now, pharmacy asked to convert home lupus meds to IV if possible.  Admitted MD ordered Solu-Medrol 4mg  daily for prednisone 5mg  daily; this is an accurate conversion though pt may benefit from a slightly larger dose while admitted.  Will begin azathioprine 200mg  IV daily and monitor to change back to home PO dose.  Unfortunately Plaquenil has no IV alternative, will monitor to resume PO when possible.  Wynona Neat, PharmD, BCPS 03/06/2014 1:25 AM

## 2014-03-06 NOTE — Progress Notes (Addendum)
Stroke Team Progress Note   Admit Date: 03/05/2014  LOS: 1 day   HISTORY [name] is a 43 y.o. female with a history of 2 episodes of previous left-sided weakness as well as previously diagnosed with lupus who presents with left-sided weakness that started on awakening from a nap that began at 1 PM.  She states that her sugar was low prior to the nap, then she drank some juice and laid down. She has a headaceh currently, though was worse earlier and is associated with photophobia. She also had some nausea and vomiting earlier for the last a couple of days.   SUBJECTIVE No family members is at the bedside.  Overall she feels her condition is gradually improving. She stated that she had a minor stroke in 09/2012 and since then she had left arm heaviness as well as left leg sometimes gave out. Her left arm not able to hold up for long and felt heavy when she brushs her hair in the morning. For the last two days, she felt sick, had N/V for the last two days and she felt her left arm heaviness getting worse but not very significant different than usual. She also complains that she had chest pain yesterday at the same time.   By reviewing her chart in 09/2012, she had stroke work up with repeat CT head did not show acute stroke. Not able to get MRI due to large habitus. 2D echo and CUS unremarkable. LDL 61 and A1C 6.9. She was put on plavix. She was also admitted for joint pain and started on prednisone and continued imuran and plaquenil for SLE.   She stated that she had bell's palsy in 2001 left with right facial droop and weak eye closing. She was diagnosed in [facility] in 2003 for SLE by Dr. Jori Moll. However, by reviewing her chart, I can only see the note in 2012 with Dr. Jerene Pitch but not sure what labs has been done for her regarding SLE diagnosis. Since 2013, she was diagnosed with CHF.  On admission, she also had hypoglycemic event. She can not remember whether correction of glucose makes her left arm  weakness better or not.  OBJECTIVE Most recent Vital Signs: Filed Vitals:   03/06/14 0600 03/06/14 0800 03/06/14 1000 03/06/14 1200  BP: 117/50 105/52 124/72 132/79  Pulse: 87 89 84 94  Temp: 98.2 F (36.8 C) 98.4 F (36.9 C) 98.1 F (36.7 C)   TempSrc: Oral Oral Oral Oral  Resp: 20 20 20 20   Height:      Weight:      SpO2: 100% 100% 93% 96%   CBG (last 3)   Recent Labs  03/06/14 0749 03/06/14 0941 03/06/14 1107  GLUCAP 85 98 95     IV Fluid Intake:   . sodium chloride 75 mL/hr at 03/06/14 0143    Medications: Scheduled:  . [START ON 03/12/2014] Albiglutide  30 mg Subcutaneous Weekly  . aspirin EC  81 mg Oral Daily  . azaTHIOprine  100 mg Oral BID  . carvedilol  3.125 mg Oral BID WC  . citalopram  10 mg Oral QPM  . clopidogrel  75 mg Oral Daily  . cycloSPORINE  1 drop Both Eyes TID  . heparin  5,000 Units Subcutaneous 3 times per day  . hydroxychloroquine  200 mg Oral BID  . loratadine  10 mg Oral Daily  . pantoprazole (PROTONIX) IV  80 mg Intravenous QHS  . predniSONE  5 mg Oral Q breakfast  .  sodium chloride  3 mL Intravenous Q12H  . torsemide  20 mg Oral BID  . traZODone  50 mg Oral QHS   Continuous Infusions:  . sodium chloride 75 mL/hr at 03/06/14 0143   PRN: @MEDSPRNSH @  Diet:  NPO except meds Activity:  Up with assistance DVT Prophylaxis:  SCDs and heparin  CLINICALLY SIGNIFICANT STUDIES Basic Metabolic Panel:  Recent Labs Lab 03/05/14 1800 03/05/14 1831 03/05/14 2259  NA 140 142  --   K 4.0 3.7  --   CL 104 105  --   CO2 25  --   --   GLUCOSE 57* 59*  --   BUN 10 9  --   CREATININE 1.06 1.10  --   CALCIUM 8.8  --   --   MG  --   --  2.0   Liver Function Tests:  Recent Labs Lab 03/05/14 1800  AST 13  ALT 9  ALKPHOS 91  BILITOT <0.2*  PROT 6.9  ALBUMIN 3.1*   CBC:  Recent Labs Lab 03/05/14 1800 03/05/14 1831  WBC 13.5*  --   NEUTROABS 9.3*  --   HGB 10.2* 11.6*  HCT 32.5* 34.0*  MCV 85.8  --   PLT 343  --     Coagulation:  Recent Labs Lab 03/05/14 1800  LABPROT 12.2  INR 0.90   Cardiac Enzymes: No results found for this basename: CKTOTAL, CKMB, CKMBINDEX, TROPONINI,  in the last 168 hours Urinalysis: No results found for this basename: COLORURINE, APPERANCEUR, LABSPEC, PHURINE, GLUCOSEU, HGBUR, BILIRUBINUR, KETONESUR, PROTEINUR, UROBILINOGEN, NITRITE, LEUKOCYTESUR,  in the last 168 hours Lipid Panel    Component Value Date/Time   CHOL 165 10/10/2012 0640   TRIG 155* 10/10/2012 0640   HDL 73 10/10/2012 0640   CHOLHDL 2.3 10/10/2012 0640   VLDL 31 10/10/2012 0640   LDLCALC 61 10/10/2012 0640   HgbA1C .resu Lab Results  Component Value Date   HGBA1C 6.8* 12/11/2013   TSH Lab Results  Component Value Date   TSH 1.842 08/30/2013   Urine Drug Screen:   No results found for this basename: labopia, cocainscrnur, labbenz, amphetmu, thcu, labbarb    Alcohol Level:  Recent Labs Lab 03/05/14 1800  ETH <11   I personally reviewed the image studies below and agree with the interpretation.  Ct Head (brain) Wo Contrast  03/05/2014   IMPRESSION: No definite acute cortical infarction. No intracranial hemorrhage, mass effect or midline shift. Mucous retention cyst bilateral maxillary sinus.   Electronically Signed   By: Lahoma Crocker M.D.   On: 03/05/2014 18:06   Carotid Doppler  pending  2D Echocardiogram  pending  CXR  Stable cardiomegaly and central pulmonary vasculature congestion.  EKG  Sinus rhythm. For complete results please see formal report.   PT/OT Recommendations pending  Physical Exam Temp:  [98.1 F (36.7 C)-98.8 F (37.1 C)] 98.1 F (36.7 C) (08/06 1000) Pulse Rate:  [84-97] 94 (08/06 1200) Resp:  [17-24] 20 (08/06 1200) BP: (91-147)/(47-80) 132/79 mmHg (08/06 1200) SpO2:  [93 %-100 %] 96 % (08/06 1200) Weight:  [400 lb 9.2 oz (181.7 kg)] 400 lb 9.2 oz (181.7 kg) (08/06 0145)  General - morbid obesity, well developed, in no apparent distress.  Ophthalmologic - not  able to see through.  Cardiovascular - Regular rate and rhythm with no murmur.  Lung - clear to auscultation bilaterally   Mental Status -  Level of arousal and orientation to time, place, and person were intact. Language including expression,  naming, repetition, comprehension was assessed and found intact.  Cranial Nerves II - XII - II - Vision intact OU. Visual fields intact . III, IV, VI - PERRL, extraocular movements intact. V - subjective slight decreased light touch sensation on V1 and V2. Also forehead vibration showed midline split VII - right lower facial palsy and right eye closure weaker than left, consistent with previous bell's palsy VIII - Hearing & vestibular intact bilaterally. X - Palate elevates symmetrically. XI - Chin turning & shoulder shrug intact bilaterally. XII - Tongue protrusion intact.  Motor Strength - The patient's strength was normal in all extremities except 5-/5 LUE proximal which is felt effort related but 5/5 distal and no pronator drift on the left.  Bulk was normal and fasciculations were absent.    Motor Tone - Muscle tone was assessed at the neck and appendages and was normal. Reflexes - The patient's reflexes were normal in all extremities and she had no pathological reflexes.  Sensory - Light touch, temperature/pinprick were assessed and were normal.    Coordination - The patient had normal movements in the hands and feet with no ataxia or dysmetria.  Tremor was absent.  Gait and Station - not tested.  ASSESSMENT [name] is a 43 y.o. female with PMH significant for SLE and Sjogren diagnosed in [facility] on prednisone and imuran, suspected stroke in 09/2012, DM type II on insulin presented with worsening LUE weakness. She had recent sickness with nausea vomiting for 2 days, also chest pain and hypoglycemia on the day of admisssion. On exam, her left UE weakness was mild and did not show significant change of her baseline as per patient and  also there is some functional component such as facial sensation midline split and effect related LUE weakness. I felt the diagnosis more favor of recrudescences of suspected chronic stroke in the setting of systemic illness such as N/V, gastroparesis, chest pain and hypoglycemia. However, since she had risk factors for stroke including DM, morbid obesity and SLE, we would like to complete the stroke work up for stroke prevention.     A1C pending  LDL pending  SLE  Morbid obesity  TREATMENT/PLAN  recrudescences of previously suspect chronic stroke  Continue ASA and plavix for secondary stroke and cardiac prevention.  Work up with stroke labs, A1C and lipid panel and B12 and FA (ordered)  Repeat 2D echo and CUS (ordered)  Stroke risk factor modification  No need to repeat CT head  Recommend out pt open MRI brain and MRA head   DM - A1C pending, 6.8 in 11/2013 - diabetic education - continue insulin therapy as per primary team - CBG monitoring - avoid hypoglycemia  SLE - diagnosis not very clear for me - will do ANA, dsDNA, SSA and SSB, C3 C4 (ordered) - she had lupus anticoagulant and antiphospholipid antibody in 09/2012 which were all negative - continue prednisone and imuran as per primary team - symptomatic management for diffuse joint pain   Morbid obesity - weight loss education - may consider dietitian education for weight loss.  Right bell's palsy - chronic - no significant management at this point - avoid corneal injury   SIGNED Rosalin Hawking, MD PhD Stroke Neurology 03/06/2014 1:58 PM   To contact Stroke Continuity provider, please refer to [email or web address] After hours, contact General Neurology

## 2014-03-07 DIAGNOSIS — I5032 Chronic diastolic (congestive) heart failure: Secondary | ICD-10-CM

## 2014-03-07 DIAGNOSIS — M3320 Polymyositis, organ involvement unspecified: Secondary | ICD-10-CM

## 2014-03-07 DIAGNOSIS — I369 Nonrheumatic tricuspid valve disorder, unspecified: Secondary | ICD-10-CM

## 2014-03-07 DIAGNOSIS — R1115 Cyclical vomiting syndrome unrelated to migraine: Secondary | ICD-10-CM

## 2014-03-07 LAB — SJOGRENS SYNDROME-B EXTRACTABLE NUCLEAR ANTIBODY: SSB (La) (ENA) Antibody, IgG: <1

## 2014-03-07 LAB — C4 COMPLEMENT: Complement C4, Body Fluid: 42 mg/dL — ABNORMAL HIGH (ref 10–40)

## 2014-03-07 LAB — GLUCOSE, CAPILLARY
GLUCOSE-CAPILLARY: 120 mg/dL — AB (ref 70–99)
GLUCOSE-CAPILLARY: 133 mg/dL — AB (ref 70–99)
Glucose-Capillary: 111 mg/dL — ABNORMAL HIGH (ref 70–99)
Glucose-Capillary: 175 mg/dL — ABNORMAL HIGH (ref 70–99)

## 2014-03-07 LAB — SJOGRENS SYNDROME-A EXTRACTABLE NUCLEAR ANTIBODY: SSA (Ro) (ENA) Antibody, IgG: >8

## 2014-03-07 LAB — ANTI-DNA ANTIBODY, DOUBLE-STRANDED: ds DNA Ab: <1 IU/mL

## 2014-03-07 LAB — ANA: ANA: POSITIVE — AB

## 2014-03-07 LAB — ANTI-NUCLEAR AB-TITER (ANA TITER): ANA Titer 1: 1:40 {titer} — ABNORMAL HIGH

## 2014-03-07 LAB — C3 COMPLEMENT: C3 Complement: 166 mg/dL (ref 90–180)

## 2014-03-07 MED ORDER — HYDROCODONE-ACETAMINOPHEN 5-325 MG PO TABS: 1.0000 | ORAL_TABLET | ORAL | Status: DC | PRN

## 2014-03-07 MED ORDER — ONDANSETRON 8 MG PO TBDP: 8.0000 mg | ORAL_TABLET | Freq: Three times a day (TID) | ORAL | Status: DC | PRN

## 2014-03-07 NOTE — Progress Notes (Signed)
  Echocardiogram 2D Echocardiogram has been performed.  [name] 03/07/2014, 9:45 AM

## 2014-03-07 NOTE — Progress Notes (Signed)
Authorization for Open MRI per [employer] is [id number]/ Case # [id number]; Mindi Slicker RN,BSN,MHA [phone]

## 2014-03-07 NOTE — Progress Notes (Signed)
Discharge orders received.  Discharge instructions and follow-up appointments reviewed with the patient.  VSS upon discharge.  IV removed and education complete.  Transported out via wheelchair, nephew present. Cori Razor, RN 03/07/2014 1:20 PM

## 2014-03-07 NOTE — Discharge Summary (Signed)
Physician Discharge Summary  [name] VZD:[id number] DOB: 1971-04-20 DOA: 03/05/2014  PCP: Elyn Peers, MD  Admit date: 03/05/2014 Discharge date: 03/07/2014  Time spent: 35 minutes  Recommendations for Outpatient Follow-up:  1. Follow up with Neurology in 1-2 weeks. 2. Open RI as an outpatient  Discharge Diagnoses:  Principal Problem:   Left-sided weakness Active Problems:   Bell's palsy   DM (diabetes mellitus)   Hypoglycemia   QT prolongation   Discharge Condition: stable  Diet recommendation: heart healthy  Filed Weights   03/06/14 0145  Weight: 181.7 kg (400 lb 9.2 oz)    History of present illness:  43 y.o. female who presents to the ED with c/o new left sided weakness. Symptoms onset at 1 pm today when she woke up from a nap. She took her sugar before the nap and it was low, drank juice, laid down and had nap. Has headache currently although this was worse earlier. Did also have some N/V earlier   Hospital Course:  Left-sided weakness:  - Bg low on admission, symptoms resolved with in 24 hrs. - cont ASA and plavix cannot perform closed MRI, scheduele as an outpatient. Neurology to follow as an outapteint. - HbA1c 6.5, FLP HDL >50 LDL < 100.  - Folate 13, B12 373, check MMA, follow result as an outpatient.  - ? If due to hypoglycemia., now resolved.   SLE  - ANA pending , dsDNA, SSA and SSB, C3 within normal except C4 barely elevated  - Lupus anticoagulant and antiphospholipid antibody in 09/2012 which were all negative  - Cont meds.  - follow by rheumatologist at [facility].  Hypoglycemia  - no further episode.   DM (diabetes mellitus)  - Has been with good controlled in house.  - A1c 6.2 cont current regimen at home.   Bell's palsy  - chronic, no further treatment.   Chronic pain:  - Do not plan on using additional IV narcotics at this time due to her QT prolongation.  - cont oral medication.  - she has been admitted 3 times in the last 6 months and 1  visit to the ED in which she was d/c  - ? compliance ? Gain.   Procedures: Carotid 1-39% ICA stenosis    Consultations:  neurology  Discharge Exam: Filed Vitals:   03/07/14 0946  BP: 121/70  Pulse: 87  Temp: 98.5 F (36.9 C)  Resp: 18    General: A&O x3 Cardiovascular: RRR Respiratory: good air movement CTA B/L  Discharge Instructions You were cared for by a hospitalist during your hospital stay. If you have any questions about your discharge medications or the care you received while you were in the hospital after you are discharged, you can call the unit and asked to speak with the hospitalist on call if the hospitalist that took care of you is not available. Once you are discharged, your primary care physician will handle any further medical issues. Please note that NO REFILLS for any discharge medications will be authorized once you are discharged, as it is imperative that you return to your primary care physician (or establish a relationship with a primary care physician if you do not have one) for your aftercare needs so that they can reassess your need for medications and monitor your lab values.      Discharge Instructions   Diet - low sodium heart healthy    Complete by:  As directed      Increase activity slowly  Complete by:  As directed             Medication List         alendronate 70 MG tablet  Commonly known as:  FOSAMAX  Take 70 mg by mouth every Saturday. saturday. Take with a full glass of water on an empty stomach.     ALPRAZolam 0.5 MG tablet  Commonly known as:  XANAX  Take 0.5 mg by mouth 2 (two) times daily as needed for sleep or anxiety.     aspirin EC 81 MG tablet  Take 81 mg by mouth daily.     azaTHIOprine 50 MG tablet  Commonly known as:  IMURAN  Take 100 mg by mouth 2 (two) times daily.     cetirizine 10 MG tablet  Commonly known as:  ZYRTEC  Take 10 mg by mouth daily.     citalopram 10 MG tablet  Commonly known as:   CELEXA  Take 1 tablet (10 mg total) by mouth every evening.     clopidogrel 75 MG tablet  Commonly known as:  PLAVIX  Take 75 mg by mouth daily.     COREG 3.125 MG tablet  Generic drug:  carvedilol  Take 3.125 mg by mouth 2 (two) times daily with a meal.     cycloSPORINE 0.05 % ophthalmic emulsion  Commonly known as:  RESTASIS  Place 1 drop into both eyes 3 (three) times daily.     famotidine 20 MG tablet  Commonly known as:  PEPCID  Take 1 tablet (20 mg total) by mouth at bedtime.     gabapentin 300 MG capsule  Commonly known as:  NEURONTIN  Take 300 mg by mouth 2 (two) times daily.     HYDROcodone-acetaminophen 5-325 MG per tablet  Commonly known as:  NORCO/VICODIN  Take 1 tablet by mouth every 6 (six) hours as needed for moderate pain.     hydroxychloroquine 200 MG tablet  Commonly known as:  PLAQUENIL  Take 200 mg by mouth 2 (two) times daily.     insulin regular human CONCENTRATED 500 UNIT/ML Soln injection  Commonly known as:  HUMULIN R  Inject 0.02 mLs (10 Units total) into the skin 3 (three) times daily with meals. Because it is concentrated, please draw up as though it is just 10 units at a time.     isosorbide-hydrALAZINE 20-37.5 MG per tablet  Commonly known as:  BIDIL  Take 1 tablet by mouth 3 (three) times daily.     metFORMIN 500 MG tablet  Commonly known as:  GLUCOPHAGE  Take 500 mg by mouth 2 (two) times daily with a meal.     omeprazole 40 MG capsule  Commonly known as:  PRILOSEC  Take 1 capsule (40 mg total) by mouth daily.     ondansetron 8 MG disintegrating tablet  Commonly known as:  ZOFRAN ODT  Take 1 tablet (8 mg total) by mouth every 8 (eight) hours as needed for nausea or vomiting.     potassium chloride SA 20 MEQ tablet  Commonly known as:  K-DUR,KLOR-CON  Take 20 mEq by mouth 2 (two) times daily.     predniSONE 5 MG tablet  Commonly known as:  DELTASONE  Take 5 mg by mouth daily with breakfast.     PROAIR HFA 108 (90 BASE) MCG/ACT  inhaler  Generic drug:  albuterol  Inhale 2 puffs into the lungs every 6 (six) hours as needed. For shortness of breath/wheeze  TANZEUM 30 MG Pen  Generic drug:  Albiglutide  Inject 30 mg into the skin once a week. Wednesdays     torsemide 20 MG tablet  Commonly known as:  DEMADEX  Take 1 tablet (20 mg total) by mouth 2 (two) times daily.     traZODone 50 MG tablet  Commonly known as:  DESYREL  Take 1 tablet (50 mg total) by mouth at bedtime.       Allergies  Allergen Reactions  . Metoclopramide Other (See Comments)    Developed restless leg, akathisia type limb movements.   . Oxycodone-Acetaminophen Hives    FYI: no reaction to hydrocodone; tolerates dilaudid  . Codeine Other (See Comments)    (Derm)      The results of significant diagnostics from this hospitalization (including imaging, microbiology, ancillary and laboratory) are listed below for reference.    Significant Diagnostic Studies: Ct Head (brain) Wo Contrast  03/05/2014   CLINICAL DATA:  Slurred speech, right facial droop  EXAM: CT HEAD WITHOUT CONTRAST  TECHNIQUE: Contiguous axial images were obtained from the base of the skull through the vertex without intravenous contrast.  COMPARISON:  10/11/2012  FINDINGS: No skull fracture is noted. Mucous retention cysts are noted bilateral maxillary sinus. On the right side measures 2.1 cm on the left maxillary sinus measures 1.5 cm. The mastoid air cells are unremarkable.  No intracranial hemorrhage, mass effect or midline shift. No acute cortical infarction. No mass lesion is noted on this unenhanced scan. No hydrocephalus. No intra or extra-axial fluid collection.  IMPRESSION: No definite acute cortical infarction. No intracranial hemorrhage, mass effect or midline shift. Mucous retention cyst bilateral maxillary sinus.   Electronically Signed   By: Lahoma Crocker M.D.   On: 03/05/2014 18:06    Microbiology: No results found for this or any previous visit (from the past  240 hour(s)).   Labs: Basic Metabolic Panel:  Recent Labs Lab 03/05/14 1800 03/05/14 1831 03/05/14 2259  NA 140 142  --   K 4.0 3.7  --   CL 104 105  --   CO2 25  --   --   GLUCOSE 57* 59*  --   BUN 10 9  --   CREATININE 1.06 1.10  --   CALCIUM 8.8  --   --   MG  --   --  2.0   Liver Function Tests:  Recent Labs Lab 03/05/14 1800  AST 13  ALT 9  ALKPHOS 91  BILITOT <0.2*  PROT 6.9  ALBUMIN 3.1*   No results found for this basename: LIPASE, AMYLASE,  in the last 168 hours No results found for this basename: AMMONIA,  in the last 168 hours CBC:  Recent Labs Lab 03/05/14 1800 03/05/14 1831  WBC 13.5*  --   NEUTROABS 9.3*  --   HGB 10.2* 11.6*  HCT 32.5* 34.0*  MCV 85.8  --   PLT 343  --    Cardiac Enzymes: No results found for this basename: CKTOTAL, CKMB, CKMBINDEX, TROPONINI,  in the last 168 hours BNP: BNP (last 3 results)  Recent Labs  04/01/13 1228 04/18/13 2235 01/13/14 0312  PROBNP 29.6 231.5* 19.2   CBG:  Recent Labs Lab 03/06/14 1630 03/06/14 2104 03/07/14 0024 03/07/14 0637 03/07/14 0810  GLUCAP 143* 141* 133* 120* 111*       Signed:  Charlynne Cousins  [facility] 03/07/2014, 9:51 AM

## 2014-03-07 NOTE — Progress Notes (Signed)
VASCULAR LAB PRELIMINARY  PRELIMINARY  PRELIMINARY  PRELIMINARY  Carotid duplex  completed.    Preliminary report:  Bilateral:  1-39% ICA stenosis.  Vertebral artery flow is antegrade.      Yoselyn Mcglade, RVT 03/07/2014, 8:59 AM

## 2014-03-07 NOTE — Progress Notes (Signed)
Pre-authorization required per Medicaid guideline for Outpatient MRI; tel # [phone] opt #2; Attending MD made aware and will call to get authorization; Aneta Mins [phone]

## 2014-03-07 NOTE — Progress Notes (Signed)
[facility] PROGRESS NOTE Assessment/Plan: Left-sided weakness: - cont ASA and plavix cannot perform closed MRI, will need open can only be done as an outpatient. - HbA1c 6.5, FLP HDL >50 LDL < 100. - Folate 13, B12 373, check MMA, follow result as an outpatient. - ? If due to hypoglycemia., now resolved.  SLE  - ANA pending , dsDNA, SSA and SSB, C3 within normal except C4 barely elevated - Lupus anticoagulant and antiphospholipid antibody in 09/2012 which were all negative  - Cont meds.  Hypoglycemia - no further episode.  DM (diabetes mellitus) - Has been with good controlled in house. - A1c 6.2 cont current regimen at home.  Bell's palsy - chronic, no further treatment.  Chronic pain: - Do not plan on using additional IV narcotics at this time due to her QT prolongation. - cont oral medication. - she has been admitted 3 times in the last 6 months and 1 visit to the ED in which she was d/c - ? compliance ? Gain.  Code Status: Full  Family Communication: No family in room  Disposition Plan: Admit to obs     Consultants:  neurology  Procedures:  CT head  Antibiotics:  None  HPI/Subjective: Cont to complain of pain.  Objective: Filed Vitals:   03/06/14 1720 03/06/14 2059 03/07/14 0206 03/07/14 0633  BP: 118/61 142/82 139/84 139/77  Pulse: 87 86 84 92  Temp: 98.6 F (37 C) 98.9 F (37.2 C) 98.6 F (37 C) 98.7 F (37.1 C)  TempSrc: Oral Oral Oral Oral  Resp: 20 20 16 18   Height:      Weight:      SpO2: 100% 100% 100% 100%   No intake or output data in the 24 hours ending 03/07/14 0915 Filed Weights   03/06/14 0145  Weight: 181.7 kg (400 lb 9.2 oz)    Exam:  General: Alert, awake, oriented x3, in no acute distress.  HEENT: No bruits, no goiter. Facial droop. Heart: Regular rate and rhythm. Lungs: Good air movement, clear Abdomen: Soft, nontender, nondistended, positive bowel sounds.  Neuro: Grossly intact, nonfocal.   Data  Reviewed: Basic Metabolic Panel:  Recent Labs Lab 03/05/14 1800 03/05/14 1831 03/05/14 2259  NA 140 142  --   K 4.0 3.7  --   CL 104 105  --   CO2 25  --   --   GLUCOSE 57* 59*  --   BUN 10 9  --   CREATININE 1.06 1.10  --   CALCIUM 8.8  --   --   MG  --   --  2.0   Liver Function Tests:  Recent Labs Lab 03/05/14 1800  AST 13  ALT 9  ALKPHOS 91  BILITOT <0.2*  PROT 6.9  ALBUMIN 3.1*   No results found for this basename: LIPASE, AMYLASE,  in the last 168 hours No results found for this basename: AMMONIA,  in the last 168 hours CBC:  Recent Labs Lab 03/05/14 1800 03/05/14 1831  WBC 13.5*  --   NEUTROABS 9.3*  --   HGB 10.2* 11.6*  HCT 32.5* 34.0*  MCV 85.8  --   PLT 343  --    Cardiac Enzymes: No results found for this basename: CKTOTAL, CKMB, CKMBINDEX, TROPONINI,  in the last 168 hours BNP (last 3 results)  Recent Labs  04/01/13 1228 04/18/13 2235 01/13/14 0312  PROBNP 29.6 231.5* 19.2   CBG:  Recent Labs Lab 03/06/14 1630 03/06/14 2104 03/07/14 0024  03/07/14 0637 03/07/14 0810  GLUCAP 143* 141* 133* 120* 111*    No results found for this or any previous visit (from the past 240 hour(s)).   Studies: Ct Head (brain) Wo Contrast  03/05/2014   CLINICAL DATA:  Slurred speech, right facial droop  EXAM: CT HEAD WITHOUT CONTRAST  TECHNIQUE: Contiguous axial images were obtained from the base of the skull through the vertex without intravenous contrast.  COMPARISON:  10/11/2012  FINDINGS: No skull fracture is noted. Mucous retention cysts are noted bilateral maxillary sinus. On the right side measures 2.1 cm on the left maxillary sinus measures 1.5 cm. The mastoid air cells are unremarkable.  No intracranial hemorrhage, mass effect or midline shift. No acute cortical infarction. No mass lesion is noted on this unenhanced scan. No hydrocephalus. No intra or extra-axial fluid collection.  IMPRESSION: No definite acute cortical infarction. No intracranial  hemorrhage, mass effect or midline shift. Mucous retention cyst bilateral maxillary sinus.   Electronically Signed   By: Lahoma Crocker M.D.   On: 03/05/2014 18:06    Scheduled Meds: . [START ON 03/12/2014] Albiglutide  30 mg Subcutaneous Weekly  . aspirin EC  81 mg Oral Daily  . azaTHIOprine  100 mg Oral BID  . carvedilol  3.125 mg Oral BID WC  . citalopram  10 mg Oral QPM  . clopidogrel  75 mg Oral Daily  . cycloSPORINE  1 drop Both Eyes TID  . heparin  5,000 Units Subcutaneous 3 times per day  . hydroxychloroquine  200 mg Oral BID  . loratadine  10 mg Oral Daily  . pantoprazole (PROTONIX) IV  80 mg Intravenous QHS  . predniSONE  5 mg Oral Q breakfast  . sodium chloride  3 mL Intravenous Q12H  . torsemide  20 mg Oral BID  . traZODone  50 mg Oral QHS   Continuous Infusions: . sodium chloride 75 mL/hr at 03/06/14 Corbin, ABRAHAM  [facility] Pager [phone]. If 8PM-8AM, please contact night-coverage at [email or web address], password [facility] 03/07/2014, 9:15 AM  LOS: 2 days      **Disclaimer: This note may have been dictated with voice recognition software. Similar sounding words can inadvertently be transcribed and this note may contain transcription errors which may not have been corrected upon publication of note.**

## 2014-03-07 NOTE — Progress Notes (Signed)
Patient needs Open MRI after discharge; patient requested [facility] but they are unable to do her Open MRI until Friday of next week; [facility] called/ Open MRI of brain scheduled for today at 2 pm; Orders faxed to [phone]/ [phone]; CM talked to patient and she will have her ride pick her up at discharge at 1:30 and go to [place] for MRI todayMindi Slicker RN,BSN,MHA [phone]

## 2014-03-12 LAB — METHYLMALONIC ACID(MMA), RND URINE
Creatinine, Urine mg/dL-MMAURN: 3.04 mmol/L (ref 2.38–26.55)
Methylmalonic Acid, Ur: 1 mmol/mol{creat} (ref 0.3–1.9)

## 2014-03-13 ENCOUNTER — Encounter: Payer: Self-pay | Admitting: Endocrinology

## 2014-03-13 ENCOUNTER — Ambulatory Visit (INDEPENDENT_AMBULATORY_CARE_PROVIDER_SITE_OTHER): Payer: Medicaid Other | Admitting: Endocrinology

## 2014-03-13 VITALS — BP 132/84 | HR 94 | Temp 98.3°F | Ht 68.0 in | Wt >= 6400 oz

## 2014-03-13 DIAGNOSIS — E1049 Type 1 diabetes mellitus with other diabetic neurological complication: Secondary | ICD-10-CM

## 2014-03-13 DIAGNOSIS — E109 Type 1 diabetes mellitus without complications: Secondary | ICD-10-CM

## 2014-03-13 MED ORDER — INSULIN LISPRO 100 UNIT/ML (KWIKPEN): 40.0000 [IU] | PEN_INJECTOR | Freq: Three times a day (TID) | SUBCUTANEOUS | Status: DC

## 2014-03-13 NOTE — Progress Notes (Signed)
Subjective:    Patient ID: [name], female    DOB: 1971-07-05, 43 y.o.   MRN: [id number]  HPI Pt returns for f/u of insulin-requiring DM (dx'ed 2011, on a routine blood test; she has mild if any neuropathy of the lower extremities; she has no known associated complications; she has never had pancreatitis, severe hypoglycemia, or DKA; she is too ill to undergo weight-loss surgery; she was on a high dosage of U-500; however, in early 2014, she lost weight, and her insulin requirement decreased; she was restarted on the U-500 in mid-2014; she was hospitalized with n/v, due to gastroparesis, in early 2015; later in 2015, metformin and tanzeum were added to insulin).  She was recently hospitalized for hypoglycemia and focal weakness.  She still has hypoglycemia almost daily, at any time of day.  Nausea is mild. Past Medical History  Diagnosis Date  . Bell's palsy 08/09/2010  . SLE 08/09/2010  . SJOGREN'S SYNDROME 08/09/2010  . Hypertension   . High cholesterol   . Anginal pain   . Obstructive sleep apnea on CPAP 2011  . Anxiety   . GERD (gastroesophageal reflux disease)   . CHF (congestive heart failure)   . Acute renal failure      Intractable nausea vomiting secondary to diabetic gastroparesis causing dehydration and acute renal failure /notes 04/01/2013  . Liver mass     biopsied 03/2013 at [facility], not malignant.  is to undergo radiologic ablation of the mass in sept/October 2014.   Marland Kitchen Pneumonia 2000's    "once" (04/01/2013)  . On home oxygen therapy     "2L 24/7" (04/01/2013)  . Migraines     "maybe a couple times/yr" (04/01/2013)  . Stroke 2010; 10/2012    "left side is still weak from it, never fully regained full strength; no additions from stroke 10/2012"  . Obesity   . Family history of malignant neoplasm of breast   . Family history of malignant neoplasm of ovary   . Lupus   . Chest pain 01/13/2014  . Diabetic gastroparesis associated with type 2 diabetes mellitus     this is  presumed diagnoses, not confirmed by any studies.   . Shortness of breath     Past Surgical History  Procedure Laterality Date  . Ectopic pregnancy surgery  1999  . Cardiac catheterization  02/07/12  . Hernia repair  3299'M    umbilical  . Epidermoid cyst excision  2007     left breast  . Esophagogastroduodenoscopy N/A 02/26/2013    Procedure: ESOPHAGOGASTRODUODENOSCOPY (EGD);  Surgeon: Irene Shipper, MD;  Location: [facility] ENDOSCOPY;  Service: Endoscopy;  Laterality: N/A;  . Breast cyst excision Left 2007    epidermoid    History   Social History  . Marital Status: Single    Spouse Name: N/A    Number of Children: N/A  . Years of Education: N/A   Occupational History  .      student   Social History Main Topics  . Smoking status: Former Smoker -- 3 years    Types: Cigarettes    Quit date: 06/01/1990  . Smokeless tobacco: Never Used  . Alcohol Use: No  . Drug Use: No  . Sexual Activity: Yes    Partners: Male    Birth Control/ Protection: Inserts     Comment: Nuva-ring   Other Topics Concern  . Not on file   Social History Narrative   Regular exercise-yes    Current Outpatient Prescriptions on  File Prior to Visit  Medication Sig Dispense Refill  . Albiglutide (TANZEUM) 30 MG PEN Inject 30 mg into the skin once a week. Wednesdays      . albuterol (PROAIR HFA) 108 (90 BASE) MCG/ACT inhaler Inhale 2 puffs into the lungs every 6 (six) hours as needed. For shortness of breath/wheeze      . alendronate (FOSAMAX) 70 MG tablet Take 70 mg by mouth every Saturday. saturday. Take with a full glass of water on an empty stomach.      . ALPRAZolam (XANAX) 0.5 MG tablet Take 0.5 mg by mouth 2 (two) times daily as needed for sleep or anxiety.       Marland Kitchen aspirin EC 81 MG tablet Take 81 mg by mouth daily.      Marland Kitchen azaTHIOprine (IMURAN) 50 MG tablet Take 100 mg by mouth 2 (two) times daily.       . carvedilol (COREG) 3.125 MG tablet Take 3.125 mg by mouth 2 (two) times daily with a meal.        . cetirizine (ZYRTEC) 10 MG tablet Take 10 mg by mouth daily.      . citalopram (CELEXA) 10 MG tablet Take 1 tablet (10 mg total) by mouth every evening.  30 tablet  0  . clopidogrel (PLAVIX) 75 MG tablet Take 75 mg by mouth daily.      . cycloSPORINE (RESTASIS) 0.05 % ophthalmic emulsion Place 1 drop into both eyes 3 (three) times daily.      . famotidine (PEPCID) 20 MG tablet Take 1 tablet (20 mg total) by mouth at bedtime.      . gabapentin (NEURONTIN) 300 MG capsule Take 300 mg by mouth 2 (two) times daily.       Marland Kitchen HYDROcodone-acetaminophen (NORCO/VICODIN) 5-325 MG per tablet Take 1 tablet by mouth every 6 (six) hours as needed for moderate pain.  30 tablet  0  . hydroxychloroquine (PLAQUENIL) 200 MG tablet Take 200 mg by mouth 2 (two) times daily.       . isosorbide-hydrALAZINE (BIDIL) 20-37.5 MG per tablet Take 1 tablet by mouth 3 (three) times daily.      . metFORMIN (GLUCOPHAGE) 500 MG tablet Take 500 mg by mouth 2 (two) times daily with a meal.      . omeprazole (PRILOSEC) 40 MG capsule Take 1 capsule (40 mg total) by mouth daily.  30 capsule  1  . ondansetron (ZOFRAN ODT) 8 MG disintegrating tablet Take 1 tablet (8 mg total) by mouth every 8 (eight) hours as needed for nausea or vomiting.  20 tablet  0  . potassium chloride SA (K-DUR,KLOR-CON) 20 MEQ tablet Take 20 mEq by mouth 2 (two) times daily.      . predniSONE (DELTASONE) 5 MG tablet Take 5 mg by mouth daily with breakfast.      . torsemide (DEMADEX) 20 MG tablet Take 1 tablet (20 mg total) by mouth 2 (two) times daily.  30 tablet  0  . traZODone (DESYREL) 50 MG tablet Take 1 tablet (50 mg total) by mouth at bedtime.  30 tablet  0   No current facility-administered medications on file prior to visit.    Allergies  Allergen Reactions  . Metoclopramide Other (See Comments)    Developed restless leg, akathisia type limb movements.   . Oxycodone-Acetaminophen Hives    FYI: no reaction to hydrocodone; tolerates dilaudid  .  Codeine Other (See Comments)    (Derm)    Family History  Problem Relation  Age of Onset  . Diabetes Mother   . Hypertension Other   . Stroke Other   . Arthritis Other   . Ovarian cancer Sister 75    maternal half-sister; RAD51C positive  . Breast cancer Maternal Aunt 59    currently 41  . Stomach cancer Maternal Uncle     dx 35s; deceased  . Cancer Paternal Aunt     unk. primary; deceased 45s  . Prostate cancer Paternal Uncle     deceased 45s  . Breast cancer Cousin     deceased 57s; daughter of mat uncle with stomach ca  . Prostate cancer Maternal Uncle     deceased 19s  . Breast cancer Maternal Aunt     deceased 65s  . Cancer Maternal Aunt     unk. primary  . Cancer Maternal Aunt     unk. primary  . Ovarian cancer Maternal Aunt     deceased 73s    BP 132/84  Pulse 94  Temp(Src) 98.3 F (36.8 C) (Oral)  Ht 5' 8" (1.727 m)  Wt 402 lb (182.346 kg)  BMI 61.14 kg/m2  SpO2 97%   Review of Systems Denies LOC and weight change.    Objective:   Physical Exam Pulses: dorsalis pedis intact bilat.   Feet: no deformity. normal color and temp.  no edema Skin:  no ulcer on the feet.   Neuro: sensation is intact to touch on the feet   Lab Results  Component Value Date   HGBA1C 6.2* 03/06/2014      Assessment & Plan:  DM: overcontrolled Side-effect of rx: hypoglycemia: moderate Gastroparesis: well-controlled.    Patient is advised the following: Patient Instructions  Please change the insulin to humalog, 40 units 3 times a day (just before each meal).  check your blood sugar 2 times a day.  vary the time of day when you check, between before the 3 meals, and at bedtime.  also check if you have symptoms of your blood sugar being too high or too low.  please keep a record of the readings and bring it to your next appointment here.  please call us sooner if you are having low blood sugar episodes, or if it stays over 200.   Please come back for a follow-up  appointment in 3 months.  The tanzeum and metformin could set off your gastroparesis symptoms, so please stop them if you get nausea.

## 2014-03-13 NOTE — Patient Instructions (Addendum)
Please change the insulin to humalog, 40 units 3 times a day (just before each meal).  check your blood sugar 2 times a day.  vary the time of day when you check, between before the 3 meals, and at bedtime.  also check if you have symptoms of your blood sugar being too high or too low.  please keep a record of the readings and bring it to your next appointment here.  please call us sooner if you are having low blood sugar episodes, or if it stays over 200.   Please come back for a follow-up appointment in 3 months.  The tanzeum and metformin could set off your gastroparesis symptoms, so please stop them if you get nausea.

## 2014-03-14 ENCOUNTER — Telehealth: Payer: Self-pay | Admitting: Endocrinology

## 2014-03-14 MED ORDER — INSULIN ASPART 100 UNIT/ML FLEXPEN: PEN_INJECTOR | SUBCUTANEOUS | Status: DC

## 2014-03-14 NOTE — Telephone Encounter (Signed)
ok 

## 2014-03-14 NOTE — Telephone Encounter (Signed)
Pt advised Humalog has been changed to Novolog. Advised pt to keep our office updated if she was to have any issues with the medication.

## 2014-03-14 NOTE — Telephone Encounter (Signed)
Pt wanted to let you know the insurance company is faxing it again

## 2014-03-14 NOTE — Telephone Encounter (Signed)
Pt's Humalog is no longer cover through insurance. Preferred is Novolog. Ok to change? Thanks!

## 2014-03-18 ENCOUNTER — Telehealth: Payer: Self-pay | Admitting: Endocrinology

## 2014-03-18 NOTE — Telephone Encounter (Signed)
Pharm needs regime clarification are we changing the humulin r u 500 to novolog flex pens?

## 2014-03-18 NOTE — Telephone Encounter (Signed)
Called pharmacy and advised that Rx for Novolog Flex pens is correct.

## 2014-04-23 ENCOUNTER — Telehealth: Payer: Self-pay | Admitting: Endocrinology

## 2014-04-23 NOTE — Telephone Encounter (Signed)
Called pt. She states that her sugars have been running within a normal range. Pt states that her numbers are within 90-130. Pt was advised to continue taking blood sugar and and if her numbers began to drop under 70 or over 200. Pt educated that taking just protein shakes is not a good nutritional source. Pt stated she would start adding food to her meals.

## 2014-04-23 NOTE — Telephone Encounter (Signed)
Patient would like to know if Dr. Loanne Drilling still wants her to take 40 units of insulin at breakfast and lunch Patient is taking protein shakes . Is this ok for her to continue? She is only eating on meal per day    Please advise patient   Thank you

## 2014-04-27 ENCOUNTER — Emergency Department (HOSPITAL_COMMUNITY)
Admission: EM | Admit: 2014-04-27 | Discharge: 2014-04-27 | Disposition: A | Discharge status: Home / Self Care | Payer: Medicaid Other | Attending: Emergency Medicine | Admitting: Emergency Medicine

## 2014-04-27 ENCOUNTER — Emergency Department (HOSPITAL_COMMUNITY): Payer: Medicaid Other

## 2014-04-27 ENCOUNTER — Encounter (HOSPITAL_COMMUNITY): Payer: Self-pay | Admitting: Emergency Medicine

## 2014-04-27 DIAGNOSIS — Z7902 Long term (current) use of antithrombotics/antiplatelets: Secondary | ICD-10-CM | POA: Diagnosis not present

## 2014-04-27 DIAGNOSIS — S298XXA Other specified injuries of thorax, initial encounter: Secondary | ICD-10-CM | POA: Diagnosis not present

## 2014-04-27 DIAGNOSIS — Y9389 Activity, other specified: Secondary | ICD-10-CM | POA: Insufficient documentation

## 2014-04-27 DIAGNOSIS — N289 Disorder of kidney and ureter, unspecified: Secondary | ICD-10-CM | POA: Diagnosis not present

## 2014-04-27 DIAGNOSIS — Z87891 Personal history of nicotine dependence: Secondary | ICD-10-CM | POA: Insufficient documentation

## 2014-04-27 DIAGNOSIS — Z8669 Personal history of other diseases of the nervous system and sense organs: Secondary | ICD-10-CM | POA: Diagnosis not present

## 2014-04-27 DIAGNOSIS — Z8673 Personal history of transient ischemic attack (TIA), and cerebral infarction without residual deficits: Secondary | ICD-10-CM | POA: Insufficient documentation

## 2014-04-27 DIAGNOSIS — Y92410 Unspecified street and highway as the place of occurrence of the external cause: Secondary | ICD-10-CM | POA: Insufficient documentation

## 2014-04-27 DIAGNOSIS — I209 Angina pectoris, unspecified: Secondary | ICD-10-CM | POA: Diagnosis not present

## 2014-04-27 DIAGNOSIS — Z8739 Personal history of other diseases of the musculoskeletal system and connective tissue: Secondary | ICD-10-CM | POA: Diagnosis not present

## 2014-04-27 DIAGNOSIS — Z3202 Encounter for pregnancy test, result negative: Secondary | ICD-10-CM | POA: Diagnosis not present

## 2014-04-27 DIAGNOSIS — I509 Heart failure, unspecified: Secondary | ICD-10-CM | POA: Insufficient documentation

## 2014-04-27 DIAGNOSIS — I10 Essential (primary) hypertension: Secondary | ICD-10-CM | POA: Insufficient documentation

## 2014-04-27 DIAGNOSIS — G43909 Migraine, unspecified, not intractable, without status migrainosus: Secondary | ICD-10-CM | POA: Diagnosis not present

## 2014-04-27 DIAGNOSIS — Z79899 Other long term (current) drug therapy: Secondary | ICD-10-CM | POA: Diagnosis not present

## 2014-04-27 DIAGNOSIS — S3981XA Other specified injuries of abdomen, initial encounter: Secondary | ICD-10-CM | POA: Diagnosis present

## 2014-04-27 DIAGNOSIS — Z794 Long term (current) use of insulin: Secondary | ICD-10-CM | POA: Insufficient documentation

## 2014-04-27 DIAGNOSIS — K3184 Gastroparesis: Secondary | ICD-10-CM | POA: Diagnosis not present

## 2014-04-27 DIAGNOSIS — D649 Anemia, unspecified: Secondary | ICD-10-CM | POA: Insufficient documentation

## 2014-04-27 DIAGNOSIS — N390 Urinary tract infection, site not specified: Secondary | ICD-10-CM

## 2014-04-27 DIAGNOSIS — F411 Generalized anxiety disorder: Secondary | ICD-10-CM | POA: Diagnosis not present

## 2014-04-27 DIAGNOSIS — E1149 Type 2 diabetes mellitus with other diabetic neurological complication: Secondary | ICD-10-CM | POA: Insufficient documentation

## 2014-04-27 DIAGNOSIS — K219 Gastro-esophageal reflux disease without esophagitis: Secondary | ICD-10-CM | POA: Insufficient documentation

## 2014-04-27 DIAGNOSIS — Z7982 Long term (current) use of aspirin: Secondary | ICD-10-CM | POA: Insufficient documentation

## 2014-04-27 DIAGNOSIS — G4733 Obstructive sleep apnea (adult) (pediatric): Secondary | ICD-10-CM | POA: Insufficient documentation

## 2014-04-27 DIAGNOSIS — IMO0002 Reserved for concepts with insufficient information to code with codable children: Secondary | ICD-10-CM | POA: Insufficient documentation

## 2014-04-27 DIAGNOSIS — Z9981 Dependence on supplemental oxygen: Secondary | ICD-10-CM | POA: Insufficient documentation

## 2014-04-27 DIAGNOSIS — R109 Unspecified abdominal pain: Secondary | ICD-10-CM

## 2014-04-27 DIAGNOSIS — Z9889 Other specified postprocedural states: Secondary | ICD-10-CM | POA: Diagnosis not present

## 2014-04-27 DIAGNOSIS — E669 Obesity, unspecified: Secondary | ICD-10-CM | POA: Insufficient documentation

## 2014-04-27 DIAGNOSIS — Z8701 Personal history of pneumonia (recurrent): Secondary | ICD-10-CM | POA: Diagnosis not present

## 2014-04-27 DIAGNOSIS — R1110 Vomiting, unspecified: Secondary | ICD-10-CM | POA: Diagnosis not present

## 2014-04-27 LAB — URINALYSIS, ROUTINE W REFLEX MICROSCOPIC
Bilirubin Urine: NEGATIVE
Glucose, UA: NEGATIVE mg/dL
KETONES UR: NEGATIVE mg/dL
NITRITE: NEGATIVE
PH: 6.5 (ref 5.0–8.0)
Protein, ur: NEGATIVE mg/dL
SPECIFIC GRAVITY, URINE: 1.028 (ref 1.005–1.030)
Urobilinogen, UA: 0.2 mg/dL (ref 0.0–1.0)

## 2014-04-27 LAB — CBC WITH DIFFERENTIAL/PLATELET
Basophils Absolute: 0 10*3/uL (ref 0.0–0.1)
Basophils Relative: 0 % (ref 0–1)
Eosinophils Absolute: 0.3 10*3/uL (ref 0.0–0.7)
Eosinophils Relative: 4 % (ref 0–5)
HCT: 30.1 % — ABNORMAL LOW (ref 36.0–46.0)
Hemoglobin: 9.6 g/dL — ABNORMAL LOW (ref 12.0–15.0)
LYMPHS PCT: 25 % (ref 12–46)
Lymphs Abs: 2.3 10*3/uL (ref 0.7–4.0)
MCH: 26.2 pg (ref 26.0–34.0)
MCHC: 31.9 g/dL (ref 30.0–36.0)
MCV: 82.2 fL (ref 78.0–100.0)
Monocytes Absolute: 0.8 10*3/uL (ref 0.1–1.0)
Monocytes Relative: 9 % (ref 3–12)
NEUTROS ABS: 5.7 10*3/uL (ref 1.7–7.7)
Neutrophils Relative %: 62 % (ref 43–77)
PLATELETS: 304 10*3/uL (ref 150–400)
RBC: 3.66 MIL/uL — AB (ref 3.87–5.11)
RDW: 15.4 % (ref 11.5–15.5)
WBC: 9.1 10*3/uL (ref 4.0–10.5)

## 2014-04-27 LAB — URINE MICROSCOPIC-ADD ON

## 2014-04-27 LAB — BASIC METABOLIC PANEL
Anion gap: 10 (ref 5–15)
BUN: 13 mg/dL (ref 6–23)
CHLORIDE: 106 meq/L (ref 96–112)
CO2: 25 meq/L (ref 19–32)
Calcium: 8.7 mg/dL (ref 8.4–10.5)
Creatinine, Ser: 0.76 mg/dL (ref 0.50–1.10)
GFR calc Af Amer: >90 mL/min (ref >90)
GFR calc non Af Amer: >90 mL/min (ref >90)
Glucose, Bld: 147 mg/dL — ABNORMAL HIGH (ref 70–99)
POTASSIUM: 4.1 meq/L (ref 3.7–5.3)
SODIUM: 141 meq/L (ref 137–147)

## 2014-04-27 LAB — PREGNANCY, URINE: PREG TEST UR: NEGATIVE

## 2014-04-27 LAB — CBG MONITORING, ED: Glucose-Capillary: 111 mg/dL — ABNORMAL HIGH (ref 70–99)

## 2014-04-27 MED ORDER — MORPHINE SULFATE 4 MG/ML IJ SOLN
4.0000 mg | Freq: Once | INTRAMUSCULAR | Status: AC
  Administered 2014-04-27: 4 mg via INTRAVENOUS
  Filled 2014-04-27: qty 1

## 2014-04-27 MED ORDER — ONDANSETRON HCL 4 MG/2ML IJ SOLN
4.0000 mg | Freq: Once | INTRAMUSCULAR | Status: AC
  Administered 2014-04-27: 4 mg via INTRAVENOUS
  Filled 2014-04-27: qty 2

## 2014-04-27 MED ORDER — ONDANSETRON HCL 4 MG PO TABS: 4.0000 mg | ORAL_TABLET | Freq: Four times a day (QID) | ORAL | Status: DC

## 2014-04-27 MED ORDER — IOHEXOL 300 MG/ML  SOLN
100.0000 mL | Freq: Once | INTRAMUSCULAR | Status: AC | PRN
  Administered 2014-04-27: 100 mL via INTRAVENOUS

## 2014-04-27 MED ORDER — CIPROFLOXACIN HCL 500 MG PO TABS: 500.0000 mg | ORAL_TABLET | Freq: Two times a day (BID) | ORAL | Status: DC

## 2014-04-27 MED ORDER — MORPHINE SULFATE 4 MG/ML IJ SOLN
8.0000 mg | Freq: Once | INTRAMUSCULAR | Status: AC
  Administered 2014-04-27: 8 mg via INTRAVENOUS
  Filled 2014-04-27: qty 2

## 2014-04-27 NOTE — ED Provider Notes (Signed)
Medical screening examination/treatment/procedure(s) were performed by non-physician practitioner and as supervising physician I was immediately available for consultation/collaboration.     Veryl Speak, MD 04/27/14 [phone]

## 2014-04-27 NOTE — ED Notes (Signed)
PA made aware of the patient's care.

## 2014-04-27 NOTE — ED Notes (Signed)
PA at the bedside.

## 2014-04-27 NOTE — ED Notes (Signed)
Patient feeling dizzy. PA made aware. Patient given some thing to eat and drink. Sitting in the bed.
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
## 2014-04-27 NOTE — ED Notes (Signed)
Patient returned from CT
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
## 2014-04-29 LAB — URINE CULTURE: Colony Count: 80000

## 2014-05-12 ENCOUNTER — Other Ambulatory Visit: Payer: Self-pay | Admitting: Endocrinology

## 2014-06-02 ENCOUNTER — Encounter (HOSPITAL_COMMUNITY): Payer: Self-pay | Admitting: Emergency Medicine

## 2014-06-06 ENCOUNTER — Encounter: Payer: Self-pay | Admitting: Obstetrics & Gynecology

## 2014-06-06 ENCOUNTER — Ambulatory Visit (INDEPENDENT_AMBULATORY_CARE_PROVIDER_SITE_OTHER): Payer: Medicaid Other | Admitting: Obstetrics & Gynecology

## 2014-06-06 VITALS — Temp 98.2°F | Ht 68.0 in | Wt 399.8 lb

## 2014-06-06 DIAGNOSIS — N939 Abnormal uterine and vaginal bleeding, unspecified: Secondary | ICD-10-CM

## 2014-06-06 LAB — POCT URINE PREGNANCY: PREG TEST UR: NEGATIVE

## 2014-06-06 MED ORDER — NORETHINDRONE 0.35 MG PO TABS: 1.0000 | ORAL_TABLET | Freq: Every day | ORAL | Status: DC

## 2014-06-06 MED ORDER — MEDROXYPROGESTERONE ACETATE 10 MG PO TABS: 20.0000 mg | ORAL_TABLET | Freq: Three times a day (TID) | ORAL | Status: DC

## 2014-06-07 LAB — GC/CHLAMYDIA PROBE AMP
CT PROBE, AMP APTIMA: NEGATIVE
GC PROBE AMP APTIMA: NEGATIVE

## 2014-06-07 LAB — HEMOGLOBIN AND HEMATOCRIT, BLOOD
HCT: 34.1 % — ABNORMAL LOW (ref 36.0–46.0)
HEMOGLOBIN: 10.7 g/dL — AB (ref 12.0–15.0)

## 2014-06-08 NOTE — Progress Notes (Signed)
Patient ID: [name], female   DOB: 12/26/1970, 43 y.o.   MRN: [id number]  Chief Complaint  Patient presents with  . Vaginal Bleeding    Patient states she has been bleeding snd passing clots slightly larger than a marble. Patient states this has been happening since [facility] 2015.    HPI [name] is a 43 y.o. female.  Noncompliant with minipill.  HPI  Past Medical History  Diagnosis Date  . Bell's palsy 08/09/2010  . SLE 08/09/2010  . SJOGREN'S SYNDROME 08/09/2010  . Hypertension   . High cholesterol   . Anginal pain   . Obstructive sleep apnea on CPAP 2011  . Anxiety   . GERD (gastroesophageal reflux disease)   . CHF (congestive heart failure)   . Acute renal failure      Intractable nausea vomiting secondary to diabetic gastroparesis causing dehydration and acute renal failure /notes 04/01/2013  . Liver mass     biopsied 03/2013 at [facility], not malignant.  is to undergo radiologic ablation of the mass in sept/October 2014.   Marland Kitchen Pneumonia 2000's    "once" (04/01/2013)  . On home oxygen therapy     "2L 24/7" (04/01/2013)  . Migraines     "maybe a couple times/yr" (04/01/2013)  . Stroke 2010; 10/2012    "left side is still weak from it, never fully regained full strength; no additions from stroke 10/2012"  . Obesity   . Family history of malignant neoplasm of breast   . Family history of malignant neoplasm of ovary   . Lupus   . Chest pain 01/13/2014  . Diabetic gastroparesis associated with type 2 diabetes mellitus     this is presumed diagnoses, not confirmed by any studies.   . Shortness of breath     Past Surgical History  Procedure Laterality Date  . Ectopic pregnancy surgery  1999  . Cardiac catheterization  02/07/12  . Hernia repair  6333'L    umbilical  . Epidermoid cyst excision  2007     left breast  . Esophagogastroduodenoscopy N/A 02/26/2013    Procedure: ESOPHAGOGASTRODUODENOSCOPY (EGD);  Surgeon: Irene Shipper, MD;  Location: [facility] ENDOSCOPY;  Service:  Endoscopy;  Laterality: N/A;  . Breast cyst excision Left 2007    epidermoid    Family History  Problem Relation Age of Onset  . Diabetes Mother   . Hypertension Other   . Stroke Other   . Arthritis Other   . Ovarian cancer Sister 48    maternal half-sister; RAD51C positive  . Breast cancer Maternal Aunt 40    currently 2  . Stomach cancer Maternal Uncle     dx 72s; deceased  . Cancer Paternal Aunt     unk. primary; deceased 27s  . Prostate cancer Paternal Uncle     deceased 43s  . Breast cancer Cousin     deceased 55s; daughter of mat uncle with stomach ca  . Prostate cancer Maternal Uncle     deceased 22s  . Breast cancer Maternal Aunt     deceased 59s  . Cancer Maternal Aunt     unk. primary  . Cancer Maternal Aunt     unk. primary  . Ovarian cancer Maternal Aunt     deceased 98s    Social History History  Substance Use Topics  . Smoking status: Former Smoker -- 3 years    Types: Cigarettes    Quit date: 06/01/1990  . Smokeless tobacco: Never Used  . Alcohol Use:  No    Allergies  Allergen Reactions  . Metoclopramide Other (See Comments)    Developed restless leg, akathisia type limb movements.   . Oxycodone-Acetaminophen Hives    FYI: no reaction to hydrocodone; tolerates dilaudid  . Codeine Other (See Comments)    (Derm)    Current Outpatient Prescriptions  Medication Sig Dispense Refill  . Albiglutide (TANZEUM) 30 MG PEN Inject 30 mg into the skin once a week. Wednesdays    . albuterol (PROAIR HFA) 108 (90 BASE) MCG/ACT inhaler Inhale 2 puffs into the lungs every 6 (six) hours as needed for wheezing or shortness of breath.     Marland Kitchen alendronate (FOSAMAX) 70 MG tablet Take 70 mg by mouth every Saturday. saturday. Take with a full glass of water on an empty stomach.    . ALPRAZolam (XANAX) 0.5 MG tablet Take 0.5 mg by mouth 2 (two) times daily as needed for sleep or anxiety.     Marland Kitchen aspirin EC 81 MG tablet Take 81 mg by mouth daily.    Marland Kitchen azaTHIOprine  (IMURAN) 50 MG tablet Take 100 mg by mouth 2 (two) times daily.     . carvedilol (COREG) 3.125 MG tablet Take 3.125 mg by mouth 2 (two) times daily with a meal.    . cetirizine (ZYRTEC) 10 MG tablet Take 10 mg by mouth daily.    . citalopram (CELEXA) 10 MG tablet Take 1 tablet (10 mg total) by mouth every evening. 30 tablet 0  . clopidogrel (PLAVIX) 75 MG tablet Take 75 mg by mouth daily.    . cycloSPORINE (RESTASIS) 0.05 % ophthalmic emulsion Place 1 drop into both eyes 3 (three) times daily.    . famotidine (PEPCID) 20 MG tablet Take 20 mg by mouth 2 (two) times daily.    . hydroxychloroquine (PLAQUENIL) 200 MG tablet Take 200 mg by mouth 2 (two) times daily.     . isosorbide-hydrALAZINE (BIDIL) 20-37.5 MG per tablet Take 1 tablet by mouth 3 (three) times daily.    . metFORMIN (GLUCOPHAGE-XR) 750 MG 24 hr tablet Take 750 mg by mouth daily with breakfast.    . morphine (MSIR) 15 MG tablet Take 15 mg by mouth every 4 (four) hours as needed for severe pain.    Marland Kitchen omeprazole (PRILOSEC) 40 MG capsule Take 1 capsule (40 mg total) by mouth daily. 30 capsule 1  . ondansetron (ZOFRAN) 4 MG tablet Take 1 tablet (4 mg total) by mouth every 6 (six) hours. 12 tablet 0  . potassium chloride SA (K-DUR,KLOR-CON) 20 MEQ tablet Take 20 mEq by mouth 2 (two) times daily.    . predniSONE (DELTASONE) 5 MG tablet Take 5 mg by mouth daily with breakfast.    . pregabalin (LYRICA) 25 MG capsule Take 50 mg by mouth 3 (three) times daily.     Marland Kitchen torsemide (DEMADEX) 20 MG tablet Take 1 tablet (20 mg total) by mouth 2 (two) times daily. 30 tablet 0  . medroxyPROGESTERone (PROVERA) 10 MG tablet Take 2 tablets (20 mg total) by mouth 3 (three) times daily. 84 tablet 0  . norethindrone (MICRONOR,CAMILA,ERRIN) 0.35 MG tablet Take 1 tablet (0.35 mg total) by mouth daily. 1 Package 11  . NOVOLOG FLEXPEN 100 UNIT/ML FlexPen INJECT 40 UNITS SUBCUTANEOUSLY THREE TIMES A DAY 45 pen 1   No current facility-administered medications for  this visit.    Review of Systems Review of Systems Constitutional: negative for fatigue and weight loss Respiratory: negative for cough and wheezing Cardiovascular: negative for  chest pain, fatigue and palpitations Gastrointestinal: negative for abdominal pain and change in bowel habits Genitourinary:positive for abnormal uterine bleeding Integument/breast: negative for nipple discharge Musculoskeletal:negative for myalgias Neurological: negative for gait problems and tremors Behavioral/Psych: negative for abusive relationship, depression Endocrine: negative for temperature intolerance     Temperature 98.2 F (36.8 C), height 5' 8"  (1.727 m), weight 181.348 kg (399 lb 12.8 oz), last menstrual period 04/28/2014.  Physical Exam Physical Exam General:   alert  Skin:   no rash or abnormalities  Lungs:   clear to auscultation bilaterally  Heart:   regular rate and rhythm, S1, S2 normal, no murmur, click, rub or gallop  Abdomen:  normal findings: no organomegaly, soft, non-tender and no hernia  Pelvis:  External genitalia: normal general appearance Urinary system: urethral meatus normal and bladder without fullness, nontender Vaginal: moderate heme Cervix: normal appearance       Data Reviewed None  Assessment    AUB-O--noncompliant with medical management Prolonged bleeding episode     Plan    Orders Placed This Encounter  Procedures  . GC/Chlamydia Probe Amp  . Hemoglobin and hematocrit, blood   Meds ordered this encounter  Medications  . morphine (MSIR) 15 MG tablet    Sig: Take 15 mg by mouth every 4 (four) hours as needed for severe pain.  . medroxyPROGESTERone (PROVERA) 10 MG tablet    Sig: Take 2 tablets (20 mg total) by mouth 3 (three) times daily.    Dispense:  84 tablet    Refill:  0  . norethindrone (MICRONOR,CAMILA,ERRIN) 0.35 MG tablet    Sig: Take 1 tablet (0.35 mg total) by mouth daily.    Dispense:  1 Package    Refill:  11    Possible  management options include: Mirena IUD Follow up as needed.         JACKSON-MOORE,Banessa Mao A 06/08/2014, 11:11 AM

## 2014-06-09 NOTE — Addendum Note (Signed)
Addended by: Carole Binning on: 06/09/2014 09:41 AM   Modules accepted: Orders

## 2014-06-13 ENCOUNTER — Encounter: Payer: Self-pay | Admitting: Endocrinology

## 2014-06-13 ENCOUNTER — Ambulatory Visit (INDEPENDENT_AMBULATORY_CARE_PROVIDER_SITE_OTHER): Payer: Medicaid Other | Admitting: Endocrinology

## 2014-06-13 VITALS — BP 132/70 | HR 101 | Temp 98.7°F | Ht 68.0 in | Wt >= 6400 oz

## 2014-06-13 DIAGNOSIS — E1042 Type 1 diabetes mellitus with diabetic polyneuropathy: Secondary | ICD-10-CM

## 2014-06-13 LAB — MICROALBUMIN / CREATININE URINE RATIO
Creatinine,U: 325.8 mg/dL
MICROALB UR: 2 mg/dL — AB (ref 0.0–1.9)
Microalb Creat Ratio: 0.6 mg/g (ref 0.0–30.0)

## 2014-06-13 LAB — HEMOGLOBIN A1C: HEMOGLOBIN A1C: 7.3 % — AB (ref 4.6–6.5)

## 2014-06-13 MED ORDER — INSULIN ASPART 100 UNIT/ML FLEXPEN: 45.0000 [IU] | PEN_INJECTOR | Freq: Three times a day (TID) | SUBCUTANEOUS | Status: DC

## 2014-06-13 NOTE — Progress Notes (Signed)
Subjective:    Patient ID: [name], female    DOB: 01-10-1971, 43 y.o.   MRN: [id number]  HPI  Pt returns for f/u of diabetes mellitus: DM type: Insulin-requiring type 2 Dx'ed: 7253 Complications: none Therapy: insulin (since), tanzeum, and metformin GDM: never DKA: never Severe hypoglycemia: never Pancreatitis: never Other: she is too ill to undergo weight-loss surgery; she was on a high dosage of U-500; this was changed to fast-acting analog after she lost weight; in early 2015, she was hospitalized with n/v, due to gastroparesis; later in 2015, Dr Criss Rosales added metformin and tanzeum.  Pt says she is not at risk for pregnancy.  she takes multiple daily injections Interval history: no cbg record, but states cbg's are in the mid-100's.   Past Medical History  Diagnosis Date  . Bell's palsy 08/09/2010  . SLE 08/09/2010  . SJOGREN'S SYNDROME 08/09/2010  . Hypertension   . High cholesterol   . Anginal pain   . Obstructive sleep apnea on CPAP 2011  . Anxiety   . GERD (gastroesophageal reflux disease)   . CHF (congestive heart failure)   . Acute renal failure      Intractable nausea vomiting secondary to diabetic gastroparesis causing dehydration and acute renal failure /notes 04/01/2013  . Liver mass     biopsied 03/2013 at [facility], not malignant.  is to undergo radiologic ablation of the mass in sept/October 2014.   Marland Kitchen Pneumonia 2000's    "once" (04/01/2013)  . On home oxygen therapy     "2L 24/7" (04/01/2013)  . Migraines     "maybe a couple times/yr" (04/01/2013)  . Stroke 2010; 10/2012    "left side is still weak from it, never fully regained full strength; no additions from stroke 10/2012"  . Obesity   . Family history of malignant neoplasm of breast   . Family history of malignant neoplasm of ovary   . Lupus   . Chest pain 01/13/2014  . Diabetic gastroparesis associated with type 2 diabetes mellitus     this is presumed diagnoses, not confirmed by any studies.   . Shortness  of breath     Past Surgical History  Procedure Laterality Date  . Ectopic pregnancy surgery  1999  . Cardiac catheterization  02/07/12  . Hernia repair  6644'I    umbilical  . Epidermoid cyst excision  2007     left breast  . Esophagogastroduodenoscopy N/A 02/26/2013    Procedure: ESOPHAGOGASTRODUODENOSCOPY (EGD);  Surgeon: Irene Shipper, MD;  [facility] ENDOSCOPY;  Service: Endoscopy;  Laterality: N/A;  . Breast cyst excision Left 2007    epidermoid    History   Social History  . Marital Status: Single    Spouse Name: N/A    Number of Children: N/A  . Years of Education: N/A   Occupational History  .      student   Social History Main Topics  . Smoking status: Former Smoker -- 3 years    Types: Cigarettes    Quit date: 06/01/1990  . Smokeless tobacco: Never Used  . Alcohol Use: No  . Drug Use: No  . Sexual Activity:    Partners: Male    Birth Control/ Protection: None   Other Topics Concern  . Not on file   Social History Narrative   Regular exercise-yes    Current Outpatient Prescriptions on File Prior to Visit  Medication Sig Dispense Refill  . Albiglutide (TANZEUM) 30 MG PEN Inject 30 mg into  the skin once a week. Wednesdays    . albuterol (PROAIR HFA) 108 (90 BASE) MCG/ACT inhaler Inhale 2 puffs into the lungs every 6 (six) hours as needed for wheezing or shortness of breath.     Marland Kitchen alendronate (FOSAMAX) 70 MG tablet Take 70 mg by mouth every Saturday. saturday. Take with a full glass of water on an empty stomach.    . ALPRAZolam (XANAX) 0.5 MG tablet Take 0.5 mg by mouth 2 (two) times daily as needed for sleep or anxiety.     Marland Kitchen aspirin EC 81 MG tablet Take 81 mg by mouth daily.    Marland Kitchen azaTHIOprine (IMURAN) 50 MG tablet Take 100 mg by mouth 2 (two) times daily.     . carvedilol (COREG) 3.125 MG tablet Take 3.125 mg by mouth 2 (two) times daily with a meal.    . cetirizine (ZYRTEC) 10 MG tablet Take 10 mg by mouth daily.    . citalopram (CELEXA) 10 MG tablet  Take 1 tablet (10 mg total) by mouth every evening. 30 tablet 0  . clopidogrel (PLAVIX) 75 MG tablet Take 75 mg by mouth daily.    . cycloSPORINE (RESTASIS) 0.05 % ophthalmic emulsion Place 1 drop into both eyes 3 (three) times daily.    . famotidine (PEPCID) 20 MG tablet Take 20 mg by mouth 2 (two) times daily.    . hydroxychloroquine (PLAQUENIL) 200 MG tablet Take 200 mg by mouth 2 (two) times daily.     . isosorbide-hydrALAZINE (BIDIL) 20-37.5 MG per tablet Take 1 tablet by mouth 3 (three) times daily.    . medroxyPROGESTERone (PROVERA) 10 MG tablet Take 2 tablets (20 mg total) by mouth 3 (three) times daily. 84 tablet 0  . metFORMIN (GLUCOPHAGE-XR) 750 MG 24 hr tablet Take 750 mg by mouth daily with breakfast.    . morphine (MSIR) 15 MG tablet Take 15 mg by mouth every 4 (four) hours as needed for severe pain.    Marland Kitchen norethindrone (MICRONOR,CAMILA,ERRIN) 0.35 MG tablet Take 1 tablet (0.35 mg total) by mouth daily. 1 Package 11  . omeprazole (PRILOSEC) 40 MG capsule Take 1 capsule (40 mg total) by mouth daily. 30 capsule 1  . ondansetron (ZOFRAN) 4 MG tablet Take 1 tablet (4 mg total) by mouth every 6 (six) hours. 12 tablet 0  . potassium chloride SA (K-DUR,KLOR-CON) 20 MEQ tablet Take 20 mEq by mouth 2 (two) times daily.    . predniSONE (DELTASONE) 5 MG tablet Take 5 mg by mouth daily with breakfast.    . pregabalin (LYRICA) 25 MG capsule Take 50 mg by mouth 3 (three) times daily.     Marland Kitchen torsemide (DEMADEX) 20 MG tablet Take 1 tablet (20 mg total) by mouth 2 (two) times daily. 30 tablet 0   No current facility-administered medications on file prior to visit.    Allergies  Allergen Reactions  . Metoclopramide Other (See Comments)    Developed restless leg, akathisia type limb movements.   . Oxycodone-Acetaminophen Hives    FYI: no reaction to hydrocodone; tolerates dilaudid  . Codeine Other (See Comments)    (Derm)    Family History  Problem Relation Age of Onset  . Diabetes Mother     . Hypertension Other   . Stroke Other   . Arthritis Other   . Ovarian cancer Sister 18    maternal half-sister; RAD51C positive  . Breast cancer Maternal Aunt 41    currently 39  . Stomach cancer Maternal Uncle  dx 31s; deceased  . Cancer Paternal Aunt     unk. primary; deceased 20s  . Prostate cancer Paternal Uncle     deceased 20s  . Breast cancer Cousin     deceased 63s; daughter of mat uncle with stomach ca  . Prostate cancer Maternal Uncle     deceased 54s  . Breast cancer Maternal Aunt     deceased 32s  . Cancer Maternal Aunt     unk. primary  . Cancer Maternal Aunt     unk. primary  . Ovarian cancer Maternal Aunt     deceased 72s    BP 132/70 mmHg  Pulse 101  Temp(Src) 98.7 F (37.1 C) (Oral)  Ht 5' 8"  (1.727 m)  Wt 408 lb (185.068 kg)  BMI 62.05 kg/m2  SpO2 97%  LMP 04/28/2014 (Approximate)  Review of Systems She has nausea but no vomiting.  She denies hypoglycemia.     Objective:   Physical Exam VITAL SIGNS:  See vs page GENERAL: no distress. Morbid obesity.  Has 02 on. Pulses: dorsalis pedis intact bilat.   Feet: no deformity.  no edema Skin:  no ulcer on the feet.  normal color and temp. Neuro: sensation is intact to touch on the feet.    Lab Results  Component Value Date   HGBA1C 7.3* 06/13/2014      Assessment & Plan:  Gastroparesis: with persistent sxs. despite this, pt wants to continue tanzeum.   DM: mild exacerbation.  Patient is advised the following: Patient Instructions  blood tests are being requested for you today.  We'll let you know about the results. check your blood sugar 2 times a day.  vary the time of day when you check, between before the 3 meals, and at bedtime.  also check if you have symptoms of your blood sugar being too high or too low.  please keep a record of the readings and bring it to your next appointment here.  please call us sooner if you are having low blood sugar episodes, or if it stays over 200.   Please  come back for a follow-up appointment in 3 months.  The tanzeum and metformin could set off your stomach problems, so please stop them if you get nausea.   addendum: increase the insulin to 45 units 3 times a day (just before each meal).

## 2014-06-13 NOTE — Patient Instructions (Addendum)
blood tests are being requested for you today.  We'll let you know about the results. check your blood sugar 2 times a day.  vary the time of day when you check, between before the 3 meals, and at bedtime.  also check if you have symptoms of your blood sugar being too high or too low.  please keep a record of the readings and bring it to your next appointment here.  please call us sooner if you are having low blood sugar episodes, or if it stays over 200.   Please come back for a follow-up appointment in 3 months.  The tanzeum and metformin could set off your stomach problems, so please stop them if you get nausea.

## 2014-06-19 ENCOUNTER — Other Ambulatory Visit: Payer: Self-pay | Admitting: Endocrinology

## 2014-06-19 ENCOUNTER — Telehealth: Payer: Self-pay | Admitting: Endocrinology

## 2014-06-19 MED ORDER — INSULIN REGULAR HUMAN (CONC) 500 UNIT/ML ~~LOC~~ SOLN: 60.0000 [IU] | Freq: Three times a day (TID) | SUBCUTANEOUS | Status: DC

## 2014-06-19 NOTE — Telephone Encounter (Signed)
See below. I contacted the pt and she states that since last ov her blood sugars have been running in the high 180s in the morning and high 200's in the evening. Please advise, Thanks!

## 2014-06-19 NOTE — Telephone Encounter (Signed)
Patient stated that the Novalog is not working for her, her B/S is going up she would like to start back taking the insulin U 500. Please advise

## 2014-06-19 NOTE — Telephone Encounter (Signed)
Contacted pt and advised of below. Pt wanted to see if she could be put back on the U 500 because last time she took the Novolog her sugars were not controlled and she does not want that to be the case again. Please advise, Thanks!

## 2014-06-19 NOTE — Telephone Encounter (Signed)
There is no advantage to the U-500, but i have sent a prescription to your pharmacy, to change.

## 2014-06-19 NOTE — Telephone Encounter (Signed)
Please increase to 55 units 3 times a day (just before each meal).

## 2014-06-19 NOTE — Telephone Encounter (Signed)
Lvom advising that medication has been sent to pharmacy. Requested call back if pt would like to discuss.

## 2014-06-27 ENCOUNTER — Other Ambulatory Visit: Payer: Self-pay | Admitting: Endocrinology

## 2014-06-27 ENCOUNTER — Telehealth: Payer: Self-pay | Admitting: *Deleted

## 2014-06-27 NOTE — Telephone Encounter (Signed)
Patient is interested in the Advance IUD. Patient states she started her last cycle the beginning of October and just stopped bleeding a week ago. Patient states she is currently on the Micronor but is having issues with it staying down. Patient states she last had intercourse a month ago. Patient advised not to resume intercourse until after insertion of device. Patient scheduled for 07-09-14 @ 1p.

## 2014-07-09 ENCOUNTER — Ambulatory Visit: Payer: Self-pay | Admitting: Obstetrics & Gynecology

## 2014-07-09 ENCOUNTER — Telehealth: Payer: Self-pay | Admitting: *Deleted

## 2014-07-09 NOTE — Telephone Encounter (Signed)
Patient called to reschedule her appointment for her Mirena IUD insertion. Rescheduled appointment for 07-31-14 @ 9:30 am.

## 2014-07-10 ENCOUNTER — Encounter (HOSPITAL_COMMUNITY): Payer: Self-pay | Admitting: Cardiology

## 2014-07-27 ENCOUNTER — Emergency Department (HOSPITAL_COMMUNITY): Payer: Medicaid Other

## 2014-07-27 ENCOUNTER — Observation Stay (HOSPITAL_COMMUNITY)
Admission: EM | Admit: 2014-07-27 | Discharge: 2014-07-29 | Disposition: A | Discharge status: Home / Self Care | Payer: Medicaid Other | Attending: Internal Medicine | Admitting: Internal Medicine

## 2014-07-27 ENCOUNTER — Encounter (HOSPITAL_COMMUNITY): Payer: Self-pay

## 2014-07-27 DIAGNOSIS — Z794 Long term (current) use of insulin: Secondary | ICD-10-CM | POA: Diagnosis not present

## 2014-07-27 DIAGNOSIS — J9610 Chronic respiratory failure, unspecified whether with hypoxia or hypercapnia: Secondary | ICD-10-CM | POA: Insufficient documentation

## 2014-07-27 DIAGNOSIS — M3500 Sicca syndrome, unspecified: Secondary | ICD-10-CM | POA: Diagnosis not present

## 2014-07-27 DIAGNOSIS — Z9981 Dependence on supplemental oxygen: Secondary | ICD-10-CM | POA: Diagnosis not present

## 2014-07-27 DIAGNOSIS — Z87891 Personal history of nicotine dependence: Secondary | ICD-10-CM | POA: Insufficient documentation

## 2014-07-27 DIAGNOSIS — K3184 Gastroparesis: Secondary | ICD-10-CM | POA: Diagnosis present

## 2014-07-27 DIAGNOSIS — Z79899 Other long term (current) drug therapy: Secondary | ICD-10-CM | POA: Diagnosis not present

## 2014-07-27 DIAGNOSIS — M542 Cervicalgia: Secondary | ICD-10-CM

## 2014-07-27 DIAGNOSIS — E119 Type 2 diabetes mellitus without complications: Secondary | ICD-10-CM

## 2014-07-27 DIAGNOSIS — I5033 Acute on chronic diastolic (congestive) heart failure: Secondary | ICD-10-CM | POA: Insufficient documentation

## 2014-07-27 DIAGNOSIS — D649 Anemia, unspecified: Secondary | ICD-10-CM | POA: Insufficient documentation

## 2014-07-27 DIAGNOSIS — E1165 Type 2 diabetes mellitus with hyperglycemia: Secondary | ICD-10-CM | POA: Diagnosis not present

## 2014-07-27 DIAGNOSIS — Z7952 Long term (current) use of systemic steroids: Secondary | ICD-10-CM | POA: Diagnosis not present

## 2014-07-27 DIAGNOSIS — R079 Chest pain, unspecified: Secondary | ICD-10-CM

## 2014-07-27 DIAGNOSIS — R0789 Other chest pain: Principal | ICD-10-CM | POA: Insufficient documentation

## 2014-07-27 DIAGNOSIS — E662 Morbid (severe) obesity with alveolar hypoventilation: Secondary | ICD-10-CM | POA: Diagnosis not present

## 2014-07-27 DIAGNOSIS — Z6841 Body Mass Index (BMI) 40.0 and over, adult: Secondary | ICD-10-CM | POA: Insufficient documentation

## 2014-07-27 DIAGNOSIS — Z8673 Personal history of transient ischemic attack (TIA), and cerebral infarction without residual deficits: Secondary | ICD-10-CM | POA: Insufficient documentation

## 2014-07-27 DIAGNOSIS — M5412 Radiculopathy, cervical region: Secondary | ICD-10-CM | POA: Insufficient documentation

## 2014-07-27 DIAGNOSIS — K219 Gastro-esophageal reflux disease without esophagitis: Secondary | ICD-10-CM | POA: Insufficient documentation

## 2014-07-27 DIAGNOSIS — R112 Nausea with vomiting, unspecified: Secondary | ICD-10-CM

## 2014-07-27 DIAGNOSIS — IMO0001 Reserved for inherently not codable concepts without codable children: Secondary | ICD-10-CM

## 2014-07-27 DIAGNOSIS — G4733 Obstructive sleep apnea (adult) (pediatric): Secondary | ICD-10-CM | POA: Diagnosis not present

## 2014-07-27 DIAGNOSIS — Z8679 Personal history of other diseases of the circulatory system: Secondary | ICD-10-CM

## 2014-07-27 DIAGNOSIS — Z8639 Personal history of other endocrine, nutritional and metabolic disease: Secondary | ICD-10-CM

## 2014-07-27 DIAGNOSIS — I10 Essential (primary) hypertension: Secondary | ICD-10-CM | POA: Insufficient documentation

## 2014-07-27 DIAGNOSIS — I5032 Chronic diastolic (congestive) heart failure: Secondary | ICD-10-CM | POA: Diagnosis present

## 2014-07-27 DIAGNOSIS — M329 Systemic lupus erythematosus, unspecified: Secondary | ICD-10-CM | POA: Diagnosis present

## 2014-07-27 LAB — CBC
HCT: 31.8 % — ABNORMAL LOW (ref 36.0–46.0)
HEMOGLOBIN: 10 g/dL — AB (ref 12.0–15.0)
MCH: 26.2 pg (ref 26.0–34.0)
MCHC: 31.4 g/dL (ref 30.0–36.0)
MCV: 83.2 fL (ref 78.0–100.0)
Platelets: 396 10*3/uL (ref 150–400)
RBC: 3.82 MIL/uL — ABNORMAL LOW (ref 3.87–5.11)
RDW: 15.6 % — ABNORMAL HIGH (ref 11.5–15.5)
WBC: 9.3 10*3/uL (ref 4.0–10.5)

## 2014-07-27 LAB — I-STAT TROPONIN, ED
Troponin i, poc: 0 ng/mL (ref 0.00–0.08)
Troponin i, poc: 0.01 ng/mL (ref 0.00–0.08)

## 2014-07-27 LAB — BASIC METABOLIC PANEL
Anion gap: 5 (ref 5–15)
BUN: 7 mg/dL (ref 6–23)
CALCIUM: 8.8 mg/dL (ref 8.4–10.5)
CO2: 25 mmol/L (ref 19–32)
CREATININE: 0.86 mg/dL (ref 0.50–1.10)
Chloride: 108 mEq/L (ref 96–112)
GFR calc Af Amer: >90 mL/min (ref >90)
GFR calc non Af Amer: 82 mL/min — ABNORMAL LOW (ref >90)
GLUCOSE: 222 mg/dL — AB (ref 70–99)
Potassium: 4.4 mmol/L (ref 3.5–5.1)
Sodium: 138 mmol/L (ref 135–145)

## 2014-07-27 LAB — BRAIN NATRIURETIC PEPTIDE: B Natriuretic Peptide: 111.6 pg/mL — ABNORMAL HIGH (ref 0.0–100.0)

## 2014-07-27 MED ORDER — HYDROMORPHONE HCL 1 MG/ML IJ SOLN
1.0000 mg | Freq: Once | INTRAMUSCULAR | Status: AC
  Administered 2014-07-27: 1 mg via INTRAVENOUS
  Filled 2014-07-27: qty 1

## 2014-07-27 MED ORDER — ONDANSETRON HCL 4 MG/2ML IJ SOLN
4.0000 mg | Freq: Once | INTRAMUSCULAR | Status: AC
  Administered 2014-07-27: 4 mg via INTRAVENOUS
  Filled 2014-07-27: qty 2

## 2014-07-27 MED ORDER — HYDROMORPHONE HCL 1 MG/ML IJ SOLN
0.5000 mg | Freq: Once | INTRAMUSCULAR | Status: AC
  Administered 2014-07-27: 0.5 mg via INTRAVENOUS
  Filled 2014-07-27: qty 1

## 2014-07-27 NOTE — ED Notes (Addendum)
Pt actively vomiting, PA Daniel Nones made aware.

## 2014-07-27 NOTE — ED Notes (Addendum)
Patient given gingerale- pt vomited 130mL of partially digested food. Tori PA made aware.  RN gave pt alcohol swab to sniff with temporary relief of nausea.

## 2014-07-27 NOTE — ED Provider Notes (Signed)
CSN: [id number]     Arrival date & time 07/27/14  1628 History   First MD Initiated Contact with Patient 07/27/14 1807     Chief Complaint  Patient presents with  . Chest Pain  . Shortness of Breath  . Neck Pain     (Consider location/radiation/quality/duration/timing/severity/associated sxs/prior Treatment) HPI  [name] is a 43 y.o. female with PMH of morbid obesity, hypertension, hypercholesterolemia, CHF, anginal pain, CVA, diabetes presenting with patient states she has chest pain around every other day last for 1 minute and resolves. Patient coming in for chest pain that started today. She is unsure of what time. She states it lasts longer about 2-3 minutes. It is also associated with shortness of breath, nausea. No vomiting or diaphoresis. She also notes that she has had left-sided neck pain with this shortness of breath that is worse with movement. Patient states chest pain worse with deep breathing and activity. Patient states she has gained 13 pounds in the last couple days. She's also with chronic cough but no worsening. She takes torsemide and is reporting taking 2 tablets instead of 1 tablet daily. She denies increased swelling of her legs. Patient denies fevers, chills, URI symptoms. She denies history of DVT, PE, estrogen use, surgery, trauma. Patient on home oxygen 2 L. Pt with cardiac cath in 2013. Former smoker. Echo 03/2014 EF 55-60%.   Past Medical History  Diagnosis Date  . Bell's palsy 08/09/2010  . SLE 08/09/2010  . SJOGREN'S SYNDROME 08/09/2010  . Hypertension   . High cholesterol   . Anginal pain   . Obstructive sleep apnea on CPAP 2011  . Anxiety   . GERD (gastroesophageal reflux disease)   . CHF (congestive heart failure)   . Acute renal failure      Intractable nausea vomiting secondary to diabetic gastroparesis causing dehydration and acute renal failure /notes 04/01/2013  . Liver mass     biopsied 03/2013 at [facility], not malignant.  is to undergo  radiologic ablation of the mass in sept/October 2014.   Marland Kitchen Pneumonia 2000's    "once" (04/01/2013)  . On home oxygen therapy     "2L 24/7" (04/01/2013)  . Migraines     "maybe a couple times/yr" (04/01/2013)  . Stroke 2010; 10/2012    "left side is still weak from it, never fully regained full strength; no additions from stroke 10/2012"  . Obesity   . Family history of malignant neoplasm of breast   . Family history of malignant neoplasm of ovary   . Lupus   . Chest pain 01/13/2014  . Diabetic gastroparesis associated with type 2 diabetes mellitus     this is presumed diagnoses, not confirmed by any studies.   . Shortness of breath    Past Surgical History  Procedure Laterality Date  . Ectopic pregnancy surgery  1999  . Cardiac catheterization  02/07/12  . Hernia repair  [address]    umbilical  . Epidermoid cyst excision  2007     left breast  . Esophagogastroduodenoscopy N/A 02/26/2013    Procedure: ESOPHAGOGASTRODUODENOSCOPY (EGD);  Surgeon: Irene Shipper, MD;  Location: [facility] ENDOSCOPY;  Service: Endoscopy;  Laterality: N/A;  . Breast cyst excision Left 2007    epidermoid  . Left and right heart catheterization with coronary angiogram N/A 02/07/2012    Procedure: LEFT AND RIGHT HEART CATHETERIZATION WITH CORONARY ANGIOGRAM;  Surgeon: Laverda Page, MD;  Location: [facility] CATH LAB;  Service: Cardiovascular;  Laterality: N/A;   Family  History  Problem Relation Age of Onset  . Diabetes Mother   . Hypertension Other   . Stroke Other   . Arthritis Other   . Ovarian cancer Sister 77    maternal half-sister; RAD51C positive  . Breast cancer Maternal Aunt 67    currently 55  . Stomach cancer Maternal Uncle     dx 83s; deceased  . Cancer Paternal Aunt     unk. primary; deceased 54s  . Prostate cancer Paternal Uncle     deceased 37s  . Breast cancer Cousin     deceased 49s; daughter of mat uncle with stomach ca  . Prostate cancer Maternal Uncle     deceased 69s  . Breast cancer Maternal Aunt      deceased 55s  . Cancer Maternal Aunt     unk. primary  . Cancer Maternal Aunt     unk. primary  . Ovarian cancer Maternal Aunt     deceased 37s   History  Substance Use Topics  . Smoking status: Former Smoker -- 3 years    Types: Cigarettes    Quit date: 06/01/1990  . Smokeless tobacco: Never Used  . Alcohol Use: No   OB History    Gravida Para Term Preterm AB TAB SAB Ectopic Multiple Living   2 1 1  1   1  1      Review of Systems  Constitutional: Negative for fever and chills.  HENT: Negative for congestion and rhinorrhea.   Eyes: Negative for visual disturbance.  Respiratory: Positive for cough and shortness of breath.   Cardiovascular: Positive for chest pain. Negative for palpitations.  Gastrointestinal: Positive for nausea. Negative for vomiting and diarrhea.  Genitourinary: Negative for dysuria and hematuria.  Musculoskeletal: Negative for back pain and gait problem.  Skin: Negative for rash.  Neurological: Negative for weakness and headaches.      Allergies  Metoclopramide; Oxycodone-acetaminophen; and Codeine  Home Medications   Prior to Admission medications   Medication Sig Start Date End Date Taking? Authorizing Provider  albuterol (PROAIR HFA) 108 (90 BASE) MCG/ACT inhaler Inhale 2 puffs into the lungs every 6 (six) hours as needed for wheezing or shortness of breath.    Yes Historical Provider, MD  ALPRAZolam Duanne Moron) 0.5 MG tablet Take 0.5 mg by mouth 2 (two) times daily as needed for sleep or anxiety.    Yes Historical Provider, MD  aspirin EC 81 MG tablet Take 81 mg by mouth daily.   Yes Historical Provider, MD  azaTHIOprine (IMURAN) 50 MG tablet Take 100 mg by mouth 2 (two) times daily.    Yes Historical Provider, MD  CARAFATE 1 GM/10ML suspension Take 10 mLs by mouth 4 (four) times daily. with meals and at bedtime 06/27/14  Yes Historical Provider, MD  carvedilol (COREG) 3.125 MG tablet Take 3.125 mg by mouth 2 (two) times daily with a meal.   Yes  Historical Provider, MD  cetirizine (ZYRTEC) 10 MG tablet Take 10 mg by mouth daily.   Yes Historical Provider, MD  clopidogrel (PLAVIX) 75 MG tablet Take 75 mg by mouth daily.   Yes Historical Provider, MD  cycloSPORINE (RESTASIS) 0.05 % ophthalmic emulsion Place 1 drop into both eyes 3 (three) times daily.   Yes Historical Provider, MD  famotidine (PEPCID) 20 MG tablet Take 20 mg by mouth 2 (two) times daily.   Yes Historical Provider, MD  hydroxychloroquine (PLAQUENIL) 200 MG tablet Take 200 mg by mouth 2 (two) times daily.  Yes Historical Provider, MD  insulin regular human CONCENTRATED (HUMULIN R) 500 UNIT/ML SOLN injection Inject 0.12 mLs (60 Units total) into the skin 3 (three) times daily with meals. 06/19/14  Yes Renato Shin, MD  isosorbide-hydrALAZINE (BIDIL) 20-37.5 MG per tablet Take 1 tablet by mouth 3 (three) times daily.   Yes Historical Provider, MD  losartan (COZAAR) 100 MG tablet Take 100 mg by mouth daily. 06/18/14  Yes Historical Provider, MD  metFORMIN (GLUCOPHAGE-XR) 750 MG 24 hr tablet Take 750 mg by mouth 2 (two) times daily. 07/07/14  Yes Historical Provider, MD  morphine (MSIR) 15 MG tablet Take 15 mg by mouth every 4 (four) hours as needed for severe pain.   Yes Historical Provider, MD  omeprazole (PRILOSEC) 40 MG capsule Take 1 capsule (40 mg total) by mouth daily. 01/15/14  Yes Barton Dubois, MD  ondansetron (ZOFRAN-ODT) 4 MG disintegrating tablet Take 4 mg by mouth every 8 (eight) hours as needed for nausea or vomiting.   Yes Historical Provider, MD  Polyvinyl Alcohol-Povidone (REFRESH OP) Place 1 drop into both eyes 2 (two) times daily.   Yes Historical Provider, MD  potassium chloride SA (K-DUR,KLOR-CON) 20 MEQ tablet Take 20 mEq by mouth 2 (two) times daily.   Yes Historical Provider, MD  predniSONE (DELTASONE) 5 MG tablet Take 5 mg by mouth daily with breakfast.   Yes Historical Provider, MD  pregabalin (LYRICA) 25 MG capsule Take 50 mg by mouth 3 (three) times  daily.  04/08/14 04/08/15 Yes Historical Provider, MD  torsemide (DEMADEX) 20 MG tablet Take 1 tablet (20 mg total) by mouth 2 (two) times daily. 04/06/13  Yes Belkys A Regalado, MD  Albiglutide (TANZEUM) 30 MG PEN Inject 30 mg into the skin once a week. Sundays    Historical Provider, MD  albuterol (PROVENTIL) (5 MG/ML) 0.5% nebulizer solution Take by nebulization every 6 (six) hours as needed. 06/24/14   Historical Provider, MD  alendronate (FOSAMAX) 70 MG tablet Take 70 mg by mouth every Saturday. saturday. Take with a full glass of water on an empty stomach.    Historical Provider, MD  citalopram (CELEXA) 10 MG tablet Take 1 tablet (10 mg total) by mouth every evening. 04/06/13   Belkys A Regalado, MD  EVZIO 0.4 MG/0.4ML SOAJ Inject 0.4 mg as directed. 06/08/14   Historical Provider, MD  medroxyPROGESTERone (PROVERA) 10 MG tablet Take 2 tablets (20 mg total) by mouth 3 (three) times daily. Patient not taking: Reported on 07/27/2014 06/06/14   Lahoma Crocker, MD   BP 119/89 mmHg  Pulse 82  Temp(Src) 98.1 F (36.7 C) (Oral)  Resp 20  Ht 5' 8"  (1.727 m)  Wt 401 lb 7.3 oz (182.1 kg)  BMI 61.06 kg/m2  SpO2 100%  LMP 07/13/2014 Physical Exam  Constitutional: She appears well-developed and well-nourished. No distress.  HENT:  Head: Normocephalic and atraumatic.  Eyes: Conjunctivae and EOM are normal. Right eye exhibits no discharge. Left eye exhibits no discharge.  Neck: Normal range of motion. Neck supple. No JVD present.  No midline neck tenderness. Left sided neck tenderness. Patient with 23 of left rotation and full right rotation.  Cardiovascular: Normal rate, regular rhythm and normal heart sounds.   1+ bilateral pitting edema, equal. No overlying skin changes. No calf tenderness.  Pulmonary/Chest: Effort normal and breath sounds normal. No respiratory distress. She has no wheezes.  Abdominal: Soft. Bowel sounds are normal. She exhibits no distension. There is no tenderness.   Neurological: She is alert. She exhibits  normal muscle tone. Coordination normal.  Skin: Skin is warm and dry. She is not diaphoretic.  Nursing note and vitals reviewed.   ED Course  Procedures (including critical care time) Labs Review Labs Reviewed  CBC - Abnormal; Notable for the following:    RBC 3.82 (*)    Hemoglobin 10.0 (*)    HCT 31.8 (*)    RDW 15.6 (*)    All other components within normal limits  BASIC METABOLIC PANEL - Abnormal; Notable for the following:    Glucose, Bld 222 (*)    GFR calc non Af Amer 82 (*)    All other components within normal limits  BRAIN NATRIURETIC PEPTIDE - Abnormal; Notable for the following:    B Natriuretic Peptide 111.6 (*)    All other components within normal limits  TROPONIN I  TROPONIN I  TROPONIN I  BASIC METABOLIC PANEL  CBC  CBC  HEMOGLOBIN A1C  I-STAT TROPOININ, ED  Randolm Idol, ED    Imaging Review Dg Chest 2 View  07/27/2014   CLINICAL DATA:  Chest pain for 1 day.  EXAM: CHEST  2 VIEW  COMPARISON:  PA and lateral chest 04/27/2014.  CT chest 04/19/2013.  FINDINGS: There is cardiomegaly but no edema. Lungs are clear. No pneumothorax or pleural effusion.  IMPRESSION: Cardiomegaly without acute disease.   Electronically Signed   By: Inge Rise M.D.   On: 07/27/2014 18:10     EKG Interpretation   Date/Time:  Sunday July 27 2014 16:35:36 EST Ventricular Rate:  87 PR Interval:  148 QRS Duration: 92 QT Interval:  382 QTC Calculation: 459 R Axis:   62 Text Interpretation:  Normal sinus rhythm Nonspecific T wave abnormality  Abnormal ECG No significant change since last tracing Confirmed by  Alvino Chapel  MD, Ovid Curd [phone]) on 07/27/2014 7:18:55 PM      MDM   Final diagnoses:  Chest pain, unspecified chest pain type  Neck pain  History of chronic CHF  Nausea and vomiting, vomiting of unspecified type  History of diabetes mellitus   Patient with history of angina presenting with cp with increased  frequency as well as increased duration of chest pain as well as neck pain. Chest pain is not at rest. Patient saw her cardiologist one week ago who states she is doing well. VSS. No JVD or leg swelling. Patient does state she has had 13 pound weight loss but does not appear volume overloaded. Troponin and chest x-ray negative. BMP 111. I doubt congestive heart failure exacerbation. Patient with possible unstable angina. Patient develops nausea and vomiting in ED. She was unable to tolerate by mouth. Unclear if this is related to her diabetes and prior gastro-paresis. Consult to hospitalist who agreed to evaluate and admit patient to telemetry bed for observation.  Discussed return precautions with patient. Discussed all results and patient verbalizes understanding and agrees with plan.  This is a shared patient. This patient was discussed with the physician, Dr. Alvino Chapel who saw and evaluated the patient and agrees with the plan.   Pura Spice, PA-C 07/28/14 [place] Alvino Chapel, MD 07/30/14 2110

## 2014-07-27 NOTE — ED Notes (Signed)
Pt reports onset of CP associated with neck pain, SOB, and nausea.  Pt with hx of CHF.

## 2014-07-28 ENCOUNTER — Encounter: Payer: Self-pay | Admitting: *Deleted

## 2014-07-28 DIAGNOSIS — M329 Systemic lupus erythematosus, unspecified: Secondary | ICD-10-CM

## 2014-07-28 DIAGNOSIS — M5412 Radiculopathy, cervical region: Secondary | ICD-10-CM | POA: Insufficient documentation

## 2014-07-28 DIAGNOSIS — Z794 Long term (current) use of insulin: Secondary | ICD-10-CM

## 2014-07-28 DIAGNOSIS — E119 Type 2 diabetes mellitus without complications: Secondary | ICD-10-CM

## 2014-07-28 DIAGNOSIS — IMO0001 Reserved for inherently not codable concepts without codable children: Secondary | ICD-10-CM

## 2014-07-28 DIAGNOSIS — R112 Nausea with vomiting, unspecified: Secondary | ICD-10-CM | POA: Insufficient documentation

## 2014-07-28 DIAGNOSIS — I5032 Chronic diastolic (congestive) heart failure: Secondary | ICD-10-CM

## 2014-07-28 DIAGNOSIS — R079 Chest pain, unspecified: Secondary | ICD-10-CM | POA: Insufficient documentation

## 2014-07-28 DIAGNOSIS — K3184 Gastroparesis: Secondary | ICD-10-CM

## 2014-07-28 DIAGNOSIS — M542 Cervicalgia: Secondary | ICD-10-CM | POA: Diagnosis present

## 2014-07-28 LAB — CBC
HCT: 29.9 % — ABNORMAL LOW (ref 36.0–46.0)
HCT: 31 % — ABNORMAL LOW (ref 36.0–46.0)
HEMOGLOBIN: 9.6 g/dL — AB (ref 12.0–15.0)
Hemoglobin: 9.8 g/dL — ABNORMAL LOW (ref 12.0–15.0)
MCH: 26.5 pg (ref 26.0–34.0)
MCH: 26.7 pg (ref 26.0–34.0)
MCHC: 32.1 g/dL (ref 30.0–36.0)
MCHC: 31.6 g/dL (ref 30.0–36.0)
MCV: 82.6 fL (ref 78.0–100.0)
MCV: 84.5 fL (ref 78.0–100.0)
PLATELETS: 377 10*3/uL (ref 150–400)
Platelets: 356 10*3/uL (ref 150–400)
RBC: 3.62 MIL/uL — AB (ref 3.87–5.11)
RBC: 3.67 MIL/uL — ABNORMAL LOW (ref 3.87–5.11)
RDW: 15.4 % (ref 11.5–15.5)
RDW: 15.5 % (ref 11.5–15.5)
WBC: 10 10*3/uL (ref 4.0–10.5)
WBC: 10.3 10*3/uL (ref 4.0–10.5)

## 2014-07-28 LAB — GLUCOSE, CAPILLARY
GLUCOSE-CAPILLARY: 144 mg/dL — AB (ref 70–99)
GLUCOSE-CAPILLARY: 142 mg/dL — AB (ref 70–99)
GLUCOSE-CAPILLARY: 163 mg/dL — AB (ref 70–99)
Glucose-Capillary: 136 mg/dL — ABNORMAL HIGH (ref 70–99)
Glucose-Capillary: 188 mg/dL — ABNORMAL HIGH (ref 70–99)

## 2014-07-28 LAB — HEMOGLOBIN A1C
Hgb A1c MFr Bld: 7.1 % — ABNORMAL HIGH (ref <5.7)
MEAN PLASMA GLUCOSE: 157 mg/dL — AB (ref <117)

## 2014-07-28 LAB — TROPONIN I
Troponin I: <0.03 ng/mL (ref <0.031)
Troponin I: <0.03 ng/mL (ref <0.031)

## 2014-07-28 LAB — BASIC METABOLIC PANEL
Anion gap: 8 (ref 5–15)
BUN: 8 mg/dL (ref 6–23)
CO2: 28 mmol/L (ref 19–32)
Calcium: 8.5 mg/dL (ref 8.4–10.5)
Chloride: 105 mEq/L (ref 96–112)
Creatinine, Ser: 1 mg/dL (ref 0.50–1.10)
GFR calc Af Amer: 79 mL/min — ABNORMAL LOW (ref >90)
GFR, EST NON AFRICAN AMERICAN: 68 mL/min — AB (ref >90)
Glucose, Bld: 145 mg/dL — ABNORMAL HIGH (ref 70–99)
Potassium: 3.8 mmol/L (ref 3.5–5.1)
SODIUM: 141 mmol/L (ref 135–145)

## 2014-07-28 MED ORDER — DICLOFENAC SODIUM 1 % TD GEL
2.0000 g | Freq: Four times a day (QID) | TRANSDERMAL | Status: DC
  Administered 2014-07-28 – 2014-07-29 (×4): 2 g via TOPICAL
  Filled 2014-07-28: qty 100

## 2014-07-28 MED ORDER — INSULIN REGULAR HUMAN (CONC) 500 UNIT/ML ~~LOC~~ SOLN: 60.0000 [IU] | Freq: Three times a day (TID) | SUBCUTANEOUS | Status: DC

## 2014-07-28 MED ORDER — SUCRALFATE 1 GM/10ML PO SUSP
1.0000 g | Freq: Three times a day (TID) | ORAL | Status: DC
  Administered 2014-07-28 – 2014-07-29 (×6): 1 g via ORAL
  Filled 2014-07-28 (×10): qty 10

## 2014-07-28 MED ORDER — ENOXAPARIN SODIUM 80 MG/0.8ML ~~LOC~~ SOLN
80.0000 mg | Freq: Every day | SUBCUTANEOUS | Status: DC
  Administered 2014-07-28 – 2014-07-29 (×2): 80 mg via SUBCUTANEOUS
  Filled 2014-07-28 (×2): qty 0.8

## 2014-07-28 MED ORDER — POTASSIUM CHLORIDE CRYS ER 20 MEQ PO TBCR
20.0000 meq | EXTENDED_RELEASE_TABLET | Freq: Two times a day (BID) | ORAL | Status: DC
  Administered 2014-07-29: 20 meq via ORAL
  Filled 2014-07-28 (×5): qty 1

## 2014-07-28 MED ORDER — INSULIN ASPART 100 UNIT/ML ~~LOC~~ SOLN
0.0000 [IU] | Freq: Three times a day (TID) | SUBCUTANEOUS | Status: DC
Start: 2014-07-29 — End: 2014-07-28

## 2014-07-28 MED ORDER — TORSEMIDE 20 MG PO TABS
20.0000 mg | ORAL_TABLET | Freq: Two times a day (BID) | ORAL | Status: DC
  Administered 2014-07-28: 20 mg via ORAL
  Filled 2014-07-28 (×5): qty 1

## 2014-07-28 MED ORDER — LOSARTAN POTASSIUM 50 MG PO TABS
100.0000 mg | ORAL_TABLET | Freq: Every day | ORAL | Status: DC
  Administered 2014-07-29: 100 mg via ORAL
  Filled 2014-07-28 (×2): qty 2

## 2014-07-28 MED ORDER — CYCLOSPORINE 0.05 % OP EMUL
1.0000 [drp] | Freq: Three times a day (TID) | OPHTHALMIC | Status: DC
Start: 2014-07-28 — End: 2014-07-29
  Administered 2014-07-28 (×2): 1 [drp] via OPHTHALMIC
  Filled 2014-07-28 (×8): qty 1

## 2014-07-28 MED ORDER — ACETAMINOPHEN 650 MG RE SUPP: 650.0000 mg | Freq: Four times a day (QID) | RECTAL | Status: DC | PRN

## 2014-07-28 MED ORDER — LORATADINE 10 MG PO TABS
10.0000 mg | ORAL_TABLET | Freq: Every day | ORAL | Status: DC
  Administered 2014-07-29: 10 mg via ORAL
  Filled 2014-07-28 (×2): qty 1

## 2014-07-28 MED ORDER — ONDANSETRON HCL 4 MG PO TABS: 4.0000 mg | ORAL_TABLET | Freq: Four times a day (QID) | ORAL | Status: DC | PRN

## 2014-07-28 MED ORDER — HYDROXYCHLOROQUINE SULFATE 200 MG PO TABS
200.0000 mg | ORAL_TABLET | Freq: Two times a day (BID) | ORAL | Status: DC
  Administered 2014-07-28 – 2014-07-29 (×2): 200 mg via ORAL
  Filled 2014-07-28 (×5): qty 1

## 2014-07-28 MED ORDER — SODIUM CHLORIDE 0.9 % IV SOLN: 250.0000 mL | INTRAVENOUS | Status: DC | PRN

## 2014-07-28 MED ORDER — ONDANSETRON HCL 4 MG/2ML IJ SOLN
4.0000 mg | Freq: Four times a day (QID) | INTRAMUSCULAR | Status: DC | PRN
  Administered 2014-07-28 – 2014-07-29 (×5): 4 mg via INTRAVENOUS
  Filled 2014-07-28 (×5): qty 2

## 2014-07-28 MED ORDER — FAMOTIDINE 20 MG PO TABS
20.0000 mg | ORAL_TABLET | Freq: Two times a day (BID) | ORAL | Status: DC
  Administered 2014-07-28 – 2014-07-29 (×2): 20 mg via ORAL
  Filled 2014-07-28 (×5): qty 1

## 2014-07-28 MED ORDER — CLOPIDOGREL BISULFATE 75 MG PO TABS
75.0000 mg | ORAL_TABLET | Freq: Every day | ORAL | Status: DC
  Administered 2014-07-29: 75 mg via ORAL
  Filled 2014-07-28 (×2): qty 1

## 2014-07-28 MED ORDER — PREGABALIN 50 MG PO CAPS
50.0000 mg | ORAL_CAPSULE | Freq: Three times a day (TID) | ORAL | Status: DC
  Administered 2014-07-28 – 2014-07-29 (×2): 50 mg via ORAL
  Filled 2014-07-28 (×3): qty 1

## 2014-07-28 MED ORDER — ALUM & MAG HYDROXIDE-SIMETH 200-200-20 MG/5ML PO SUSP: 30.0000 mL | Freq: Four times a day (QID) | ORAL | Status: DC | PRN

## 2014-07-28 MED ORDER — CITALOPRAM HYDROBROMIDE 10 MG PO TABS
10.0000 mg | ORAL_TABLET | Freq: Every evening | ORAL | Status: DC
Start: 2014-07-28 — End: 2014-07-29
  Administered 2014-07-28: 10 mg via ORAL
  Filled 2014-07-28 (×2): qty 1

## 2014-07-28 MED ORDER — NITROGLYCERIN 2 % TD OINT
0.5000 [in_us] | TOPICAL_OINTMENT | Freq: Four times a day (QID) | TRANSDERMAL | Status: DC
  Administered 2014-07-28: 0.5 [in_us] via TOPICAL
  Filled 2014-07-28: qty 30

## 2014-07-28 MED ORDER — PREDNISONE 5 MG PO TABS
5.0000 mg | ORAL_TABLET | Freq: Every day | ORAL | Status: DC
  Administered 2014-07-28 – 2014-07-29 (×2): 5 mg via ORAL
  Filled 2014-07-28 (×3): qty 1

## 2014-07-28 MED ORDER — ALBIGLUTIDE 30 MG ~~LOC~~ PEN: 30.0000 mg | PEN_INJECTOR | SUBCUTANEOUS | Status: DC

## 2014-07-28 MED ORDER — CARVEDILOL 3.125 MG PO TABS
3.1250 mg | ORAL_TABLET | Freq: Two times a day (BID) | ORAL | Status: DC
  Administered 2014-07-28 – 2014-07-29 (×2): 3.125 mg via ORAL
  Filled 2014-07-28 (×5): qty 1

## 2014-07-28 MED ORDER — INSULIN ASPART 100 UNIT/ML ~~LOC~~ SOLN: 0.0000 [IU] | Freq: Every day | SUBCUTANEOUS | Status: DC

## 2014-07-28 MED ORDER — INSULIN ASPART 100 UNIT/ML ~~LOC~~ SOLN
0.0000 [IU] | Freq: Three times a day (TID) | SUBCUTANEOUS | Status: DC
Start: 2014-07-28 — End: 2014-07-29
  Administered 2014-07-28 – 2014-07-29 (×3): 4 [IU] via SUBCUTANEOUS

## 2014-07-28 MED ORDER — AZATHIOPRINE 50 MG PO TABS
100.0000 mg | ORAL_TABLET | Freq: Two times a day (BID) | ORAL | Status: DC
  Administered 2014-07-28 – 2014-07-29 (×2): 100 mg via ORAL
  Filled 2014-07-28 (×5): qty 2

## 2014-07-28 MED ORDER — SODIUM CHLORIDE 0.9 % IJ SOLN
3.0000 mL | Freq: Two times a day (BID) | INTRAMUSCULAR | Status: DC
  Administered 2014-07-28 – 2014-07-29 (×2): 3 mL via INTRAVENOUS

## 2014-07-28 MED ORDER — INSULIN ASPART 100 UNIT/ML ~~LOC~~ SOLN
0.0000 [IU] | Freq: Three times a day (TID) | SUBCUTANEOUS | Status: DC
  Administered 2014-07-28: 1 [IU] via SUBCUTANEOUS

## 2014-07-28 MED ORDER — INSULIN GLARGINE 100 UNIT/ML ~~LOC~~ SOLN
20.0000 [IU] | Freq: Every day | SUBCUTANEOUS | Status: DC
  Administered 2014-07-28: 20 [IU] via SUBCUTANEOUS
  Filled 2014-07-28 (×2): qty 0.2

## 2014-07-28 MED ORDER — SODIUM CHLORIDE 0.9 % IJ SOLN
3.0000 mL | Freq: Two times a day (BID) | INTRAMUSCULAR | Status: DC
  Administered 2014-07-28: 3 mL via INTRAVENOUS

## 2014-07-28 MED ORDER — INSULIN ASPART 100 UNIT/ML ~~LOC~~ SOLN: 6.0000 [IU] | Freq: Three times a day (TID) | SUBCUTANEOUS | Status: DC

## 2014-07-28 MED ORDER — INSULIN ASPART 100 UNIT/ML ~~LOC~~ SOLN
6.0000 [IU] | Freq: Three times a day (TID) | SUBCUTANEOUS | Status: DC
  Administered 2014-07-28 – 2014-07-29 (×3): 6 [IU] via SUBCUTANEOUS

## 2014-07-28 MED ORDER — SODIUM CHLORIDE 0.9 % IJ SOLN: 3.0000 mL | INTRAMUSCULAR | Status: DC | PRN

## 2014-07-28 MED ORDER — MORPHINE SULFATE 2 MG/ML IJ SOLN
1.0000 mg | Freq: Four times a day (QID) | INTRAMUSCULAR | Status: AC | PRN
  Administered 2014-07-28 (×3): 1 mg via INTRAVENOUS
  Filled 2014-07-28 (×3): qty 1

## 2014-07-28 MED ORDER — MORPHINE SULFATE 2 MG/ML IJ SOLN
1.0000 mg | INTRAMUSCULAR | Status: DC | PRN
  Administered 2014-07-28 – 2014-07-29 (×5): 1 mg via INTRAVENOUS
  Filled 2014-07-28 (×5): qty 1

## 2014-07-28 MED ORDER — ISOSORB DINITRATE-HYDRALAZINE 20-37.5 MG PO TABS
1.0000 | ORAL_TABLET | Freq: Three times a day (TID) | ORAL | Status: DC
  Administered 2014-07-28: 1 via ORAL
  Filled 2014-07-28 (×7): qty 1

## 2014-07-28 MED ORDER — ACETAMINOPHEN 325 MG PO TABS
650.0000 mg | ORAL_TABLET | Freq: Four times a day (QID) | ORAL | Status: DC | PRN
  Administered 2014-07-28: 650 mg via ORAL
  Filled 2014-07-28: qty 2

## 2014-07-28 MED ORDER — ALBUTEROL SULFATE (2.5 MG/3ML) 0.083% IN NEBU: 2.5000 mg | INHALATION_SOLUTION | Freq: Four times a day (QID) | RESPIRATORY_TRACT | Status: DC | PRN

## 2014-07-28 MED ORDER — METFORMIN HCL ER 750 MG PO TB24
750.0000 mg | ORAL_TABLET | Freq: Two times a day (BID) | ORAL | Status: DC
  Administered 2014-07-28 – 2014-07-29 (×2): 750 mg via ORAL
  Filled 2014-07-28 (×5): qty 1

## 2014-07-28 MED ORDER — MORPHINE SULFATE 15 MG PO TABS
15.0000 mg | ORAL_TABLET | ORAL | Status: DC | PRN
  Filled 2014-07-28: qty 1

## 2014-07-28 MED ORDER — ALPRAZOLAM 0.5 MG PO TABS
0.5000 mg | ORAL_TABLET | Freq: Two times a day (BID) | ORAL | Status: DC | PRN
  Administered 2014-07-28: 0.5 mg via ORAL
  Filled 2014-07-28: qty 1

## 2014-07-28 NOTE — Progress Notes (Signed)
UR completed 

## 2014-07-28 NOTE — Progress Notes (Addendum)
PROGRESS NOTE    [name] BWI:[id number] DOB: 1971-02-24 DOA: 07/27/2014 PCP: Elyn Peers, MD  Primary Cardiologist: Dr. Kela Millin  HPI/Brief narrative 43 year old female patient with multiple medical problems including morbid obesity, chronic respiratory failure on home oxygen 2 L/m continuously, OSA on nightly C Pap, type II DM with gastroparesis, essential hypertension, CVA, SLE/Sjogrens Syndrome, apparent negative heart cath in 2013, chronic anginal type of chest pains presents with worsening chest pain and new onset of left-sided neck pain.    Assessment/Plan:  1. Chest pain: Patient states that she has chronic midsternal chest pain that she gets up to 1-2 times per week and for the last 2 days she has experienced worsening midsternal chest pain like she's has when she had excess fluid in her lungs. She also has new onset left-sided neck pain which started yesterday that radiates to her anterior chest and is worse on neck movements. Some of this chest pain has anginal nature to it but the rest is musculoskeletal from neck pain. Could also be due to mildly decompensated CHF. Troponin 2 negative. EKG without acute changes. Requested cardiology/Dr. Einar Gip to evaluate. 2.  Left-sided neck pain: Woke up on 12/27 AM with this pain, worse on neck movements to the left and sometimes radiates to the anterior chest wall. No radiation to upper extremities, numbness, tingling or weakness. Likely sprained neck. We'll treat symptomatically with K pad and pain medications. Monitor. No further workup at this time unless it fails to improve over the next few days as outpatient of if it worsens.  3. Acute on chronic diastolic CHF: She states that she missed a day of diuretics during travel but claims compliance to diet and fluid restriction. Diuretics and monitor. 4. Uncontrolled type II DM: There is some confusion as to her dose of U 500 insulin. Patient states that she takes only 12 units 3 times  a day (ordered here for 60 units TID-has not received any here). Requested pharmacy to redo her med reconciliation. DC U 500 for now and will place on Lantus 20 units daily at bedtime, NovoLog 6 units 3 times a day with meals and resistant NovoLog SSI. Consulted diabetes coordinator for assistance. 5. Essential hypertension: Controlled. 6. OSA: Continue nightly C Pap 7. Chronic respiratory failure: Continue home oxygen. 8. Morbid obesity 9. Diabetic gastroparesis: Had a couple of episodes of nausea and vomiting yesterday. Seems to have improved. Only nauseous this morning. 10. History of SLE/Sjogrens syndrome: continue home meds 11. Chronic anemia: Stable   Code Status: Full Family Communication: None at bedside Disposition Plan: Home possibly 12/29   Consultants:  Cardiology  Procedures:  None  Antibiotics:  None   Subjective: Left-sided neck pain, worse with movements and at times radiates to mid anterior chest. Neck pain has improved compared to admission. Otherwise no chest pain. Mild nausea but no vomiting.  Objective: Filed Vitals:   07/27/14 2212 07/28/14 0007 07/28/14 0134 07/28/14 0417  BP: 119/89 137/56  145/78  Pulse: 79 82 85 71  Temp:  98.1 F (36.7 C)  98.1 F (36.7 C)  TempSrc:  Oral  Oral  Resp: 20 20 181 12  Height:      Weight:  182.1 kg (401 lb 7.3 oz)    SpO2: 98% 100% 100% 100%    Intake/Output Summary (Last 24 hours) at 07/28/14 1359 Last data filed at 07/27/14 2114  Gross per 24 hour  Intake      0 ml  Output    250  ml  Net   -250 ml   Filed Weights   07/27/14 1638 07/28/14 0007  Weight: 173.728 kg (383 lb) 182.1 kg (401 lb 7.3 oz)     Exam:  General exam: Moderately built and morbidly obese female lying comfortably supine in bed. Respiratory system: Occasional basal crackles but otherwise clear to auscultation. No increased work of breathing. Cardiovascular system: S1 & S2 heard, RRR. No JVD, murmurs, gallops. Trace bilateral ankle  edema. Telemetry: Sinus rhythm.  Gastrointestinal system: Abdomen is nondistended, soft and nontender. Normal bowel sounds heard. Central nervous system: Alert and oriented. No focal neurological deficits. Extremities: Symmetric 5 x 5 power. Musculoskeletal system: Mild tenderness and stiffness of left posterior strap muscles of neck reproducing pain.  Data Reviewed: Basic Metabolic Panel:  Recent Labs Lab 07/27/14 1646 07/28/14 0640  NA 138 141  K 4.4 3.8  CL 108 105  CO2 25 28  GLUCOSE 222* 145*  BUN 7 8  CREATININE 0.86 1.00  CALCIUM 8.8 8.5   Liver Function Tests: No results for input(s): AST, ALT, ALKPHOS, BILITOT, PROT, ALBUMIN in the last 168 hours. No results for input(s): LIPASE, AMYLASE in the last 168 hours. No results for input(s): AMMONIA in the last 168 hours. CBC:  Recent Labs Lab 07/27/14 1646 07/28/14 0230 07/28/14 0640  WBC 9.3 10.0 10.3  HGB 10.0* 9.6* 9.8*  HCT 31.8* 29.9* 31.0*  MCV 83.2 82.6 84.5  PLT 396 356 377   Cardiac Enzymes:  Recent Labs Lab 07/28/14 0230 07/28/14 0640  TROPONINI <0.03 <0.03   BNP (last 3 results)  Recent Labs  01/13/14 0312  PROBNP 19.2   CBG:  Recent Labs Lab 07/28/14 0141 07/28/14 0807 07/28/14 1115  GLUCAP 136* 144* 142*    No results found for this or any previous visit (from the past 240 hour(s)).     Studies: Dg Chest 2 View  07/27/2014   CLINICAL DATA:  Chest pain for 1 day.  EXAM: CHEST  2 VIEW  COMPARISON:  PA and lateral chest 04/27/2014.  CT chest 04/19/2013.  FINDINGS: There is cardiomegaly but no edema. Lungs are clear. No pneumothorax or pleural effusion.  IMPRESSION: Cardiomegaly without acute disease.   Electronically Signed   By: Inge Rise M.D.   On: 07/27/2014 18:10        Scheduled Meds: . Albiglutide  30 mg Subcutaneous Q Sun  . azaTHIOprine  100 mg Oral BID  . carvedilol  3.125 mg Oral BID WC  . citalopram  10 mg Oral QPM  . clopidogrel  75 mg Oral Daily  .  cycloSPORINE  1 drop Both Eyes 3 times per day  . enoxaparin (LOVENOX) injection  80 mg Subcutaneous Daily  . famotidine  20 mg Oral BID  . hydroxychloroquine  200 mg Oral BID  . insulin aspart  0-5 Units Subcutaneous QHS  . insulin aspart  0-9 Units Subcutaneous TID WC  . insulin regular human CONCENTRATED  60 Units Subcutaneous TID WC  . isosorbide-hydrALAZINE  1 tablet Oral TID  . loratadine  10 mg Oral Daily  . losartan  100 mg Oral Daily  . metFORMIN  750 mg Oral BID WC  . nitroGLYCERIN  0.5 inch Topical 4 times per day  . potassium chloride SA  20 mEq Oral BID  . predniSONE  5 mg Oral Q breakfast  . pregabalin  50 mg Oral TID  . sodium chloride  3 mL Intravenous Q12H  . sodium chloride  3 mL Intravenous  Q12H  . sucralfate  1 g Oral TID AC & HS  . torsemide  20 mg Oral BID   Continuous Infusions:   Principal Problem:   Chest pain Active Problems:   SLE   SJOGREN'S SYNDROME   Chronic diastolic heart failure   Gastroparesis   Neck pain   IDDM (insulin dependent diabetes mellitus)   Pain in the chest   Nausea with vomiting   Radiculopathy of cervical spine    Time spent: 30 minutes.    Vernell Leep, MD, FACP, FHM. [facility] Pager [phone]  If 7PM-7AM, please contact night-coverage [email or web address] Password [email or web address] 07/28/2014, 1:59 PM    LOS: 1 day

## 2014-07-28 NOTE — H&P (Signed)
[facility] Admission History and Physical       [name] LOV:[id number] DOB: [birth year] DOA: 07/27/2014  Referring physician: EDP PCP: Elyn Peers, MD  Specialists:   Chief Complaint: Chest Pain  HPI: [name] is a 43 y.o. female with Multiple Medical Problems who presents to the ED with complaints of Intermittent chest pain for the past 3 days.  The pain is a sharp pain that last for 1-2 minutes at a time.  She also has Left sided neck pain that is also radiating into her chest .    She denies any SOB, but she has had a episode of N+V in the ED x 1.    Her cardiac workup has been negative so far.      Review of Systems:  Constitutional: No Weight Loss, No Weight Gain, Night Sweats, Fevers, Chills, Dizziness, Fatigue, or Generalized Weakness HEENT: No Headaches, Difficulty Swallowing,Tooth/Dental Problems,Sore Throat,  No Sneezing, Rhinitis, Ear Ache, Nasal Congestion, or Post Nasal Drip,  Cardio-vascular:  +Chest pain, Orthopnea, PND, Edema in Lower Extremities, Anasarca, Dizziness, Palpitations  Resp: No Dyspnea, No DOE, No Productive Cough, No Non-Productive Cough, No Hemoptysis, No Wheezing.    GI: No Heartburn, Indigestion, Abdominal Pain, +Nausea,+ Vomiting, Diarrhea, Hematemesis, Hematochezia, Melena, Change in Bowel Habits,  Loss of Appetite  GU: No Dysuria, Change in Color of Urine, No Urgency or Frequency, No Flank pain.  Musculoskeletal: +Neck Pain, No Decreased Range of Motion, No Back Pain.  Neurologic: No Syncope, No Seizures, Muscle Weakness, Paresthesia, Vision Disturbance or Loss, No Diplopia, No Vertigo, No Difficulty Walking,  Skin: No Rash or Lesions. Psych: No Change in Mood or Affect, No Depression or Anxiety, No Memory loss, No Confusion, or Hallucinations   Past Medical History  Diagnosis Date  . Bell's palsy 08/09/2010  . SLE 08/09/2010  . SJOGREN'S SYNDROME 08/09/2010  . Hypertension   . High cholesterol   . Anginal pain   . Obstructive  sleep apnea on CPAP 2011  . Anxiety   . GERD (gastroesophageal reflux disease)   . CHF (congestive heart failure)   . Acute renal failure      Intractable nausea vomiting secondary to diabetic gastroparesis causing dehydration and acute renal failure /notes 04/01/2013  . Liver mass     biopsied 03/2013 at [facility], not malignant.  is to undergo radiologic ablation of the mass in sept/October 2014.   Marland Kitchen Pneumonia 2000's    "once" (04/01/2013)  . On home oxygen therapy     "2L 24/7" (04/01/2013)  . Migraines     "maybe a couple times/yr" (04/01/2013)  . Stroke 2010; 10/2012    "left side is still weak from it, never fully regained full strength; no additions from stroke 10/2012"  . Obesity   . Family history of malignant neoplasm of breast   . Family history of malignant neoplasm of ovary   . Lupus   . Chest pain 01/13/2014  . Diabetic gastroparesis associated with type 2 diabetes mellitus     this is presumed diagnoses, not confirmed by any studies.   . Shortness of breath       Past Surgical History  Procedure Laterality Date  . Ectopic pregnancy surgery  1999  . Cardiac catheterization  02/07/12  . Hernia repair  8841'Y    umbilical  . Epidermoid cyst excision  2007     left breast  . Esophagogastroduodenoscopy N/A 02/26/2013    Procedure: ESOPHAGOGASTRODUODENOSCOPY (EGD);  Surgeon: Irene Shipper, MD;  Location:  [facility];  Service: Endoscopy;  Laterality: N/A;  . Breast cyst excision Left 2007    epidermoid  . Left and right heart catheterization with coronary angiogram N/A 02/07/2012    Procedure: LEFT AND RIGHT HEART CATHETERIZATION WITH CORONARY ANGIOGRAM;  Surgeon: Laverda Page, MD;  Location: [facility] C[facility] LAB;  Service: Cardiovascular;  Laterality: N/A;       Prior to Admission medications   Medication Sig Start Date End Date Taking? Authorizing Provider  albuterol (PROAIR HFA) 108 (90 BASE) MCG/ACT inhaler Inhale 2 puffs into the lungs every 6 (six) hours as needed for  wheezing or shortness of breath.    Yes Historical Provider, MD  ALPRAZolam Duanne Moron) 0.5 MG tablet Take 0.5 mg by mouth 2 (two) times daily as needed for sleep or anxiety.    Yes Historical Provider, MD  aspirin EC 81 MG tablet Take 81 mg by mouth daily.   Yes Historical Provider, MD  azaTHIOprine (IMURAN) 50 MG tablet Take 100 mg by mouth 2 (two) times daily.    Yes Historical Provider, MD  CARAFATE 1 GM/10ML suspension Take 10 mLs by mouth 4 (four) times daily. with meals and at bedtime 06/27/14  Yes Historical Provider, MD  carvedilol (COREG) 3.125 MG tablet Take 3.125 mg by mouth 2 (two) times daily with a meal.   Yes Historical Provider, MD  cetirizine (ZYRTEC) 10 MG tablet Take 10 mg by mouth daily.   Yes Historical Provider, MD  clopidogrel (PLAVIX) 75 MG tablet Take 75 mg by mouth daily.   Yes Historical Provider, MD  cycloSPORINE (RESTASIS) 0.05 % ophthalmic emulsion Place 1 drop into both eyes 3 (three) times daily.   Yes Historical Provider, MD  famotidine (PEPCID) 20 MG tablet Take 20 mg by mouth 2 (two) times daily.   Yes Historical Provider, MD  hydroxychloroquine (PLAQUENIL) 200 MG tablet Take 200 mg by mouth 2 (two) times daily.    Yes Historical Provider, MD  insulin regular human CONCENTRATED (HUMULIN R) 500 UNIT/ML SOLN injection Inject 0.12 mLs (60 Units total) into the skin 3 (three) times daily with meals. 06/19/14  Yes Renato Shin, MD  isosorbide-hydrALAZINE (BIDIL) 20-37.5 MG per tablet Take 1 tablet by mouth 3 (three) times daily.   Yes Historical Provider, MD  losartan (COZAAR) 100 MG tablet Take 100 mg by mouth daily. 06/18/14  Yes Historical Provider, MD  metFORMIN (GLUCOPHAGE-XR) 750 MG 24 hr tablet Take 750 mg by mouth 2 (two) times daily. 07/07/14  Yes Historical Provider, MD  morphine (MSIR) 15 MG tablet Take 15 mg by mouth every 4 (four) hours as needed for severe pain.   Yes Historical Provider, MD  omeprazole (PRILOSEC) 40 MG capsule Take 1 capsule (40 mg total) by  mouth daily. 01/15/14  Yes Barton Dubois, MD  ondansetron (ZOFRAN-ODT) 4 MG disintegrating tablet Take 4 mg by mouth every 8 (eight) hours as needed for nausea or vomiting.   Yes Historical Provider, MD  Polyvinyl Alcohol-Povidone (REFRESH OP) Place 1 drop into both eyes 2 (two) times daily.   Yes Historical Provider, MD  potassium chloride SA (K-DUR,KLOR-CON) 20 MEQ tablet Take 20 mEq by mouth 2 (two) times daily.   Yes Historical Provider, MD  predniSONE (DELTASONE) 5 MG tablet Take 5 mg by mouth daily with breakfast.   Yes Historical Provider, MD  pregabalin (LYRICA) 25 MG capsule Take 50 mg by mouth 3 (three) times daily.  04/08/14 04/08/15 Yes Historical Provider, MD  torsemide (DEMADEX) 20 MG tablet Take 1  tablet (20 mg total) by mouth 2 (two) times daily. 04/06/13  Yes Belkys A Regalado, MD  Albiglutide (TANZEUM) 30 MG PEN Inject 30 mg into the skin once a week. Wednesdays    Historical Provider, MD  albuterol (PROVENTIL) (5 MG/ML) 0.5% nebulizer solution Take by nebulization every 6 (six) hours as needed. 06/24/14   Historical Provider, MD  alendronate (FOSAMAX) 70 MG tablet Take 70 mg by mouth every Saturday. saturday. Take with a full glass of water on an empty stomach.    Historical Provider, MD  citalopram (CELEXA) 10 MG tablet Take 1 tablet (10 mg total) by mouth every evening. 04/06/13   Belkys A Regalado, MD  EVZIO 0.4 MG/0.4ML SOAJ Inject 0.4 mg as directed. 06/08/14   Historical Provider, MD  medroxyPROGESTERone (PROVERA) 10 MG tablet Take 2 tablets (20 mg total) by mouth 3 (three) times daily. Patient not taking: Reported on 07/27/2014 06/06/14   Lahoma Crocker, MD      Allergies  Allergen Reactions  . Metoclopramide Other (See Comments)    Developed restless leg, akathisia type limb movements.   . Oxycodone-Acetaminophen Hives    FYI: no reaction to hydrocodone; tolerates dilaudid  . Codeine Itching, Rash and Other (See Comments)    (Derm)     Social History:  reports that  she quit smoking about 24 years ago. Her smoking use included Cigarettes. She smoked 0.00 packs per day for 3 years. She has never used smokeless tobacco. She reports that she does not drink alcohol or use illicit drugs.     Family History  Problem Relation Age of Onset  . Diabetes Mother   . Hypertension Other   . Stroke Other   . Arthritis Other   . Ovarian cancer Sister 44    maternal half-sister; RAD51C positive  . Breast cancer Maternal Aunt 32    currently 89  . Stomach cancer Maternal Uncle     dx 75s; deceased  . Cancer Paternal Aunt     unk. primary; deceased 73s  . Prostate cancer Paternal Uncle     deceased 63s  . Breast cancer Cousin     deceased 84s; daughter of mat uncle with stomach ca  . Prostate cancer Maternal Uncle     deceased 70s  . Breast cancer Maternal Aunt     deceased 53s  . Cancer Maternal Aunt     unk. primary  . Cancer Maternal Aunt     unk. primary  . Ovarian cancer Maternal Aunt     deceased 37s       Physical Exam:  GEN:  Pleasant Morbidly Obese 43 y.o. African American female examined  and in no acute distress; cooperative with exam Filed Vitals:   07/27/14 2100 07/27/14 2200 07/27/14 2212 07/28/14 0007  BP: 102/80 119/89 119/89   Pulse: 80 81 79 82  Temp:    98.1 F (36.7 C)  TempSrc:    Oral  Resp: 20 24 20 20   Height:      Weight:    182.1 kg (401 lb 7.3 oz)  SpO2: 100% 100% 98% 100%   Blood pressure 119/89, pulse 82, temperature 98.1 F (36.7 C), temperature source Oral, resp. rate 20, height 5' 8"  (1.727 m), weight 182.1 kg (401 lb 7.3 oz), last menstrual period 07/13/2014, SpO2 100 %. PSYCH: She is alert and oriented x4; does not appear anxious does not appear depressed; affect is normal HEENT: Normocephalic and Atraumatic, Mucous membranes pink; PERRLA; EOM intact; Fundi:  Benign;  No scleral icterus, Nares: Patent, Oropharynx: Clear, Fair Dentition,    Neck:  FROM, No Cervical Lymphadenopathy nor Thyromegaly or Carotid  Bruit; No JVD; Breasts:: Not examined CHEST WALL: No tenderness CHEST: Normal respiration, clear to auscultation bilaterally HEART: Regular rate and rhythm; no murmurs rubs or gallops BACK: No kyphosis or scoliosis; No CVA tenderness ABDOMEN: Positive Bowel Sounds, Obese, Soft Non-Tender; No Masses, No Organomegaly. Rectal Exam: Not done EXTREMITIES: No Cyanosis, Clubbing, or Edema; No Ulcerations. Genitalia: not examined PULSES: 2+ and symmetric SKIN: Normal hydration no rash or ulceration CNS:  Alert and Oriented x 4 and NonFocal Vascular: pulses palpable throughout    Labs on Admission:  Basic Metabolic Panel:  Recent Labs Lab 07/27/14 1646  NA 138  K 4.4  CL 108  CO2 25  GLUCOSE 222*  BUN 7  CREATININE 0.86  CALCIUM 8.8   Liver Function Tests: No results for input(s): AST, ALT, ALKPHOS, BILITOT, PROT, ALBUMIN in the last 168 hours. No results for input(s): LIPASE, AMYLASE in the last 168 hours. No results for input(s): AMMONIA in the last 168 hours. CBC:  Recent Labs Lab 07/27/14 1646  WBC 9.3  HGB 10.0*  HCT 31.8*  MCV 83.2  PLT 396   Cardiac Enzymes: No results for input(s): CKTOTAL, CKMB, CKMBINDEX, TROPONINI in the last 168 hours.  BNP (last 3 results)  Recent Labs  01/13/14 0312  PROBNP 19.2   CBG: No results for input(s): GLUCAP in the last 168 hours.  Radiological Exams on Admission: Dg Chest 2 View  07/27/2014   CLINICAL DATA:  Chest pain for 1 day.  EXAM: CHEST  2 VIEW  COMPARISON:  PA and lateral chest 04/27/2014.  CT chest 04/19/2013.  FINDINGS: There is cardiomegaly but no edema. Lungs are clear. No pneumothorax or pleural effusion.  IMPRESSION: Cardiomegaly without acute disease.   Electronically Signed   By: Inge Rise M.D.   On: 07/27/2014 18:10     EKG: Independently reviewed. Normal Sinus Rhythm at Rate =87   Assessment/Plan:   43 y.o. female with  Principal Problem:   1.   Chest pain   Telemetry Monitoring   Cycle  Troponins   Nitropaste as BP tolerates, O2, ASA   Check Lipids in AM  Active Problems:   2.    Neck pain   MRI of Cervical Spine     3.    SLE and SJOGREN'S SYNDROME   On Plaquenil and Imuran and Prednisone Rx     4.   Chronic diastolic heart failure   Compensated at this Time    Continue Torsemide, Carvedilol, Losartan, and BIDIL     5.   IDDM (insulin dependent diabetes mellitus)/Gastroparesis   Continue Lantus,  Albiglutide, and Metformin XR   SSI PRN   Check HbA1c.        6.   DVT Prophylaxis   Lovenox     Code Status:  FULL CODE     Family Communication:   No Family Present Disposition Plan:     Inpatient/ Observation Telemetry Bed    Time spent: 34 Minutes  Theressa Millard [facility] Pager [phone]   If [place] Please Contact the Day Rounding Team MD for [facility]  If 7PM-7AM, Please Contact Night-Floor Coverage  [email or web address] Password [email or web address] 07/28/2014, 12:22 AM

## 2014-07-29 ENCOUNTER — Encounter: Payer: Self-pay | Admitting: Obstetrics & Gynecology

## 2014-07-29 LAB — GLUCOSE, CAPILLARY
GLUCOSE-CAPILLARY: 157 mg/dL — AB (ref 70–99)
Glucose-Capillary: 155 mg/dL — ABNORMAL HIGH (ref 70–99)

## 2014-07-29 MED ORDER — ONDANSETRON HCL 4 MG PO TABS: 4.0000 mg | ORAL_TABLET | Freq: Four times a day (QID) | ORAL | Status: DC | PRN

## 2014-07-29 MED ORDER — ONDANSETRON 4 MG PO TBDP: 4.0000 mg | ORAL_TABLET | Freq: Three times a day (TID) | ORAL | Status: DC | PRN

## 2014-07-29 MED ORDER — DICLOFENAC SODIUM 1 % TD GEL: 2.0000 g | Freq: Four times a day (QID) | TRANSDERMAL | Status: DC

## 2014-07-29 MED ORDER — ONDANSETRON HCL 4 MG/2ML IJ SOLN
4.0000 mg | INTRAMUSCULAR | Status: DC | PRN
  Administered 2014-07-29: 4 mg via INTRAVENOUS
  Filled 2014-07-29: qty 2

## 2014-07-29 NOTE — Discharge Instructions (Signed)

## 2014-07-29 NOTE — Progress Notes (Addendum)
Inpatient Diabetes Program Recommendations  AACE/ADA: New Consensus Statement on Inpatient Glycemic Control (2013)  Target Ranges:  Prepandial:   less than 140 mg/dL      Peak postprandial:   less than 180 mg/dL (1-2 hours)      Critically ill patients:  140 - 180 mg/dL   Reason for Visit: Diabetes Consult  Diabetes history: DM2 Outpatient Diabetes medications: U-500 12 units tidwc in a U-100 syringe Current orders for Inpatient glycemic control: Lantus 20 units QHS, Novolog resistant tidwc and hs + 6 units tidwc for meal coverage  Pt states Dr Loanne Drilling has been adjusting her U-500 insulin dose for past several months d/t hypoglycemia. Has been doing well at 12 units tidwc. Was not eating yesterday and states her gastroparesis was "acting up." C/o nausea and vomiting yesterday. No U-500 insulin has been given since admission. Started Lantus 20 units last night. Blood sugars trending well today. Ate approx 75% of breakfast meal. Pt states she checks blood sugars 3-4 times/day.  Results for [name], [name] (MRN [id number]) as of 07/29/2014 10:04  Ref. Range 07/28/2014 06:40  Hgb A1c MFr Bld Latest Range: <5.7 % 7.1 (H)  Results for MALIE, KASHANI (MRN [id number]) as of 07/29/2014 10:04  Ref. Range 07/28/2014 01:41 07/28/2014 08:07 07/28/2014 11:15 07/28/2014 16:24 07/28/2014 21:51 07/29/2014 06:51  Glucose-Capillary Latest Range: 70-99 mg/dL 136 (H) 144 (H) 142 (H) 188 (H) 163 (H) 157 (H)   Good glycemic control with Lantus and Novolog.  Inpatient Diabetes Program Recommendations Insulin - Basal: If FBS >180 mg/dL, increase Lantus to 30 units QHS Insulin - Meal Coverage: If post-prandial blood sugars >180 mg/dL, increase Novolog to 10 units tidwc HgbA1C: 7.1% - good control prior to admission Outpatient Referral: Sees RD through [facility]  Note: Will check CBGs before lunch to see if adjustments are needed. Pharmacy has pt's U-500 insulin. Would not restart U-500 until pt  is discharged.  Will continue to follow. Thank you. Lorenda Peck, RD, LDN, CDE Inpatient Diabetes Coordinator [phone]  Addendum: In a U-100 syringe, pt pulls up 12 units of U-500 insulin.

## 2014-07-29 NOTE — Discharge Summary (Signed)
Physician Discharge Summary  [name] TKW:[id number] DOB: June 11, 1971 DOA: 07/27/2014  PCP: Elyn Peers, MD  Admit date: 07/27/2014 Discharge date: 07/29/2014  Time spent: Less than 30 minutes  Recommendations for Outpatient Follow-up:  1. Dr. Lucianne Lei, PCP in 5 days. 2. Dr. Kela Millin, Cardiology: Patient has outpatient appointment for echocardiogram. 3. Dr. Renato Shin, Endocrinology in 1 week.  Discharge Diagnoses:  Principal Problem:   Chest pain Active Problems:   SLE   SJOGREN'S SYNDROME   Chronic diastolic heart failure   Gastroparesis   Neck pain   IDDM (insulin dependent diabetes mellitus)   Pain in the chest   Nausea with vomiting   Radiculopathy of cervical spine   Discharge Condition: Improved & Stable  Diet recommendation: Heart healthy and diabetic diet.  Filed Weights   07/27/14 1638 07/28/14 0007  Weight: 173.728 kg (383 lb) 182.1 kg (401 lb 7.3 oz)    History of present illness:  43 year old female patient with multiple medical problems including morbid obesity, chronic respiratory failure on home oxygen 2 L/m continuously, OSA on nightly C Pap, type II DM with gastroparesis, essential hypertension, CVA, SLE/Sjogrens Syndrome, apparent negative heart cath in 2013, chronic anginal type of chest pains presents with worsening chest pain and new onset of left-sided neck pain.   Hospital Course:   1. Chest pain: Patient states that she has chronic midsternal chest pain that she gets up to 1-2 times per week and for the last 2 days she has experienced worsening midsternal chest pain like she's has when she had excess fluid in her lungs. She also has new onset left-sided neck pain which started yesterday that radiates to her anterior chest and is worse on neck movements. Some of this chest pain has anginal nature to it but the rest is musculoskeletal from neck pain. Could also be due to mildly decompensated CHF. Troponin 3 negative. EKG without acute  changes. Cardiology consultation appreciated: Suspect that her chest pain is noncardiac in etiology. Cardiac cath on 02/07/12 showed normal coronary arteries and echo 03/2014 showed EF 55-60 percent. Patient has outpatient echocardiogram scheduled for next week. Cardiology has cleared her for discharge home. Chest pain resolved. 2. Left-sided neck pain: Woke up on 12/27 AM with this pain, worse on neck movements to the left and sometimes radiates to the anterior chest wall. No radiation to upper extremities, numbness, tingling or weakness. Likely sprained neck. We'll treat symptomatically with K pad and pain medications. Monitor. No further workup at this time unless it fails to improve over the next few days as outpatient of if it worsens. Significantly improved. Continue symptomatic treatment and outpatient follow-up. 3. Chronic diastolic CHF: She states that she missed a day of diuretics during travel but claims compliance to diet and fluid restriction. Diuretics and monitor. Diuretic dose recently adjusted by cardiology as outpatient. Outpatient follow-up with cardiology. Compensated. 4. Uncontrolled type II DM: Patient takes U 500 insulin at 60 units 3 times a day with meals-dose was verified multiple times by pharmacy. Patient follows with Dr. Loanne Drilling, endocrinology. She states that she has not had any hypoglycemic episodes and is compliant with her insulins. Her A1c is 7.1 suggesting good outpatient control. Continue home regimen and outpatient follow-up with endocrinology. 5. Essential hypertension: Controlled. 6. OSA: Continue nightly C Pap 7. Chronic respiratory failure: Continue home oxygen. 8. Morbid obesity 9. Diabetic gastroparesis: Had a couple of episodes of nausea and vomiting yesterday. Seems to have improved. Only nauseous this morning without any further episodes  of emesis. Has tolerated diet. 10. History of SLE/Sjogrens syndrome: continue home meds 11. Chronic anemia:  Stable   Consultations:  Cardiology  Procedures:  None    Discharge Exam:  Complaints:  Feels much better. Left-sided neck pain has significantly improved and patient is able to turn her neck to the left more than she was able to yesterday. Denies any further chest pain. Had mild nausea this morning but has tolerated diet without vomiting.  Filed Vitals:   07/28/14 2152 07/29/14 0006 07/29/14 0549 07/29/14 1013  BP: 137/79  115/75 140/67  Pulse: 88 88 93 88  Temp: 98.7 F (37.1 C)  98.7 F (37.1 C)   TempSrc: Oral  Oral   Resp: 18 18 18 18   Height:      Weight:      SpO2: 100% 100% 100% 99%   General exam: Moderately built and morbidly obese female sitting up comfortably in bed. Respiratory system: Occasional basal crackles but otherwise clear to auscultation. No increased work of breathing. Cardiovascular system: S1 & S2 heard, RRR. No JVD, murmurs, gallops. Trace bilateral ankle edema. Telemetry: Sinus rhythm.  Gastrointestinal system: Abdomen is nondistended, soft and nontender. Normal bowel sounds heard. Central nervous system: Alert and oriented. No focal neurological deficits. Extremities: Symmetric 5 x 5 power. Musculoskeletal system: Mild tenderness and stiffness of left posterior strap muscles of neck reproducing pain-decreasing.  Discharge Instructions      Discharge Instructions    (HEART FAILURE PATIENTS) Call MD:  Anytime you have any of the following symptoms: 1) 3 pound weight gain in 24 hours or 5 pounds in 1 week 2) shortness of breath, with or without a dry hacking cough 3) swelling in the hands, feet or stomach 4) if you have to sleep on extra pillows at night in order to breathe.    Complete by:  As directed      Call MD for:  severe uncontrolled pain    Complete by:  As directed      Diet - low sodium heart healthy    Complete by:  As directed      Diet Carb Modified    Complete by:  As directed      Increase activity slowly    Complete by:  As  directed             Medication List    STOP taking these medications        medroxyPROGESTERone 10 MG tablet  Commonly known as:  PROVERA      TAKE these medications        alendronate 70 MG tablet  Commonly known as:  FOSAMAX  Take 70 mg by mouth every Saturday. saturday. Take with a full glass of water on an empty stomach.     ALPRAZolam 0.5 MG tablet  Commonly known as:  XANAX  Take 0.5 mg by mouth 2 (two) times daily as needed for sleep or anxiety.     aspirin EC 81 MG tablet  Take 81 mg by mouth daily.     azaTHIOprine 50 MG tablet  Commonly known as:  IMURAN  Take 100 mg by mouth 2 (two) times daily.     CARAFATE 1 GM/10ML suspension  Generic drug:  sucralfate  Take 10 mLs by mouth 4 (four) times daily. with meals and at bedtime     cetirizine 10 MG tablet  Commonly known as:  ZYRTEC  Take 10 mg by mouth daily.     citalopram 10  MG tablet  Commonly known as:  CELEXA  Take 1 tablet (10 mg total) by mouth every evening.     clopidogrel 75 MG tablet  Commonly known as:  PLAVIX  Take 75 mg by mouth daily.     COREG 3.125 MG tablet  Generic drug:  carvedilol  Take 3.125 mg by mouth 2 (two) times daily with a meal.     cycloSPORINE 0.05 % ophthalmic emulsion  Commonly known as:  RESTASIS  Place 1 drop into both eyes 3 (three) times daily.     diclofenac sodium 1 % Gel  Commonly known as:  VOLTAREN  Apply 2 g topically 4 (four) times daily. Apply to back of left side of neck.     EVZIO 0.4 MG/0.4ML Soaj  Generic drug:  Naloxone HCl  Inject 0.4 mg as directed.     famotidine 20 MG tablet  Commonly known as:  PEPCID  Take 20 mg by mouth 2 (two) times daily.     hydroxychloroquine 200 MG tablet  Commonly known as:  PLAQUENIL  Take 200 mg by mouth 2 (two) times daily.     insulin regular human CONCENTRATED 500 UNIT/ML Soln injection  Commonly known as:  HUMULIN R  Inject 0.12 mLs (60 Units total) into the skin 3 (three) times daily with meals.      isosorbide-hydrALAZINE 20-37.5 MG per tablet  Commonly known as:  BIDIL  Take 1 tablet by mouth 3 (three) times daily.     losartan 100 MG tablet  Commonly known as:  COZAAR  Take 100 mg by mouth daily.     LYRICA 25 MG capsule  Generic drug:  pregabalin  Take 50 mg by mouth 3 (three) times daily.     metFORMIN 750 MG 24 hr tablet  Commonly known as:  GLUCOPHAGE-XR  Take 750 mg by mouth 2 (two) times daily.     morphine 15 MG tablet  Commonly known as:  MSIR  Take 15 mg by mouth every 4 (four) hours as needed for severe pain.     omeprazole 40 MG capsule  Commonly known as:  PRILOSEC  Take 1 capsule (40 mg total) by mouth daily.     ondansetron 4 MG disintegrating tablet  Commonly known as:  ZOFRAN-ODT  Take 4 mg by mouth every 8 (eight) hours as needed for nausea or vomiting.     potassium chloride SA 20 MEQ tablet  Commonly known as:  K-DUR,KLOR-CON  Take 20 mEq by mouth 2 (two) times daily.     predniSONE 5 MG tablet  Commonly known as:  DELTASONE  Take 5 mg by mouth daily with breakfast.     albuterol (5 MG/ML) 0.5% nebulizer solution  Commonly known as:  PROVENTIL  Take by nebulization every 6 (six) hours as needed.     PROAIR HFA 108 (90 BASE) MCG/ACT inhaler  Generic drug:  albuterol  Inhale 2 puffs into the lungs every 6 (six) hours as needed for wheezing or shortness of breath.     REFRESH OP  Place 1 drop into both eyes 2 (two) times daily.     TANZEUM 30 MG Pen  Generic drug:  Albiglutide  Inject 30 mg into the skin once a week. Sundays     torsemide 20 MG tablet  Commonly known as:  DEMADEX  Take 1 tablet (20 mg total) by mouth 2 (two) times daily.       Follow-up Information    Follow up with [facility], MD. Schedule an appointment as soon as possible for a visit in 5 days.   Specialty:  Family Medicine   Contact information:   [address] [place] [address] [phone]       Follow up with Laverda Page, MD.    Specialty:  Cardiology   Why:  Keep up appointment for out patient Echo and follow up.   Contact information:   [address] [place] [address] [phone]       Follow up with Renato Shin, MD. Schedule an appointment as soon as possible for a visit in 1 week.   Specialty:  Endocrinology   Contact information:   [address] [place] [address] [phone]        The results of significant diagnostics from this hospitalization (including imaging, microbiology, ancillary and laboratory) are listed below for reference.    Significant Diagnostic Studies: Dg Chest 2 View  07/27/2014   CLINICAL DATA:  Chest pain for 1 day.  EXAM: CHEST  2 VIEW  COMPARISON:  PA and lateral chest 04/27/2014.  CT chest 04/19/2013.  FINDINGS: There is cardiomegaly but no edema. Lungs are clear. No pneumothorax or pleural effusion.  IMPRESSION: Cardiomegaly without acute disease.   Electronically Signed   By: Inge Rise M.D.   On: 07/27/2014 18:10    Microbiology: No results found for this or any previous visit (from the past 240 hour(s)).   Labs: Basic Metabolic Panel:  Recent Labs Lab 07/27/14 1646 07/28/14 0640  NA 138 141  K 4.4 3.8  CL 108 105  CO2 25 28  GLUCOSE 222* 145*  BUN 7 8  CREATININE 0.86 1.00  CALCIUM 8.8 8.5   Liver Function Tests: No results for input(s): AST, ALT, ALKPHOS, BILITOT, PROT, ALBUMIN in the last 168 hours. No results for input(s): LIPASE, AMYLASE in the last 168 hours. No results for input(s): AMMONIA in the last 168 hours. CBC:  Recent Labs Lab 07/27/14 1646 07/28/14 0230 07/28/14 0640  WBC 9.3 10.0 10.3  HGB 10.0* 9.6* 9.8*  HCT 31.8* 29.9* 31.0*  MCV 83.2 82.6 84.5  PLT 396 356 377   Cardiac Enzymes:  Recent Labs Lab 07/28/14 0230 07/28/14 0640 07/28/14 1248  TROPONINI <0.03 <0.03 <0.03   BNP: BNP (last 3 results)  Recent Labs  01/13/14 0312  PROBNP 19.2   CBG:  Recent Labs Lab  07/28/14 1115 07/28/14 1624 07/28/14 2151 07/29/14 0651 07/29/14 1130  GLUCAP 142* 188* 163* 157* 155*        Signed:  Vernell Leep, MD, FACP, FHM. [facility] Pager [phone]  If 7PM-7AM, please contact night-coverage [email or web address] Password [email or web address] 07/29/2014, 12:20 PM

## 2014-07-29 NOTE — Consult Note (Signed)
CARDIOLOGY CONSULT NOTE  Patient ID: [name] MRN: [id number] DOB/AGE: 08/28/1970 43 y.o.  Admit date: 07/27/2014 Referring Physician: [facility] Primary Physician:  Elyn Peers, MD Reason for Consultation  Chest pain  HPI: [name] is a 43 y.o. female with a history of morbid obesity, hypertension, hypercholesterolemia, CHF, anginal pain, CVA, diabetes, Sjogren syndrome/SLE who presented to the ED with chest pain.  She states she first developed left sided neck pain and stiffness over the weekend and then developed chest pain on Monday.  She also reports SOB and cough which is chronic as well as nausea.  She had multiple episodes of vomiting in the ED.  She reports weight gain of 13 pounds over the last couple days, however weight gain was also noted during office visit last week secondary to steroid use for acute exacerbation of  Systemic  SLE.  She has been taking 2 tablets of torsemide instead of 1 tablet daily for weight gain. She denies increased swelling of her legs. She denies fevers, chills, URI symptoms.  Patient on home O2 @ 2L. Cardiac cath on 02/07/2012 showed normal coronary arteries. Echo 03/2014 EF 55-60% with repeat echocardiogram scheduled outpatient next week.  Past Medical History  Diagnosis Date  . Bell's palsy 08/09/2010  . SLE 08/09/2010  . SJOGREN'S SYNDROME 08/09/2010  . Hypertension   . High cholesterol   . Anginal pain   . Obstructive sleep apnea on CPAP 2011  . Anxiety   . GERD (gastroesophageal reflux disease)   . CHF (congestive heart failure)   . Acute renal failure      Intractable nausea vomiting secondary to diabetic gastroparesis causing dehydration and acute renal failure /notes 04/01/2013  . Liver mass     biopsied 03/2013 at [facility], not malignant.  is to undergo radiologic ablation of the mass in sept/October 2014.   Marland Kitchen Pneumonia 2000's    "once" (04/01/2013)  . On home oxygen therapy     "2L 24/7" (04/01/2013)  . Migraines     "maybe a  couple times/yr" (04/01/2013)  . Stroke 2010; 10/2012    "left side is still weak from it, never fully regained full strength; no additions from stroke 10/2012"  . Obesity   . Family history of malignant neoplasm of breast   . Family history of malignant neoplasm of ovary   . Lupus   . Chest pain 01/13/2014  . Diabetic gastroparesis associated with type 2 diabetes mellitus     this is presumed diagnoses, not confirmed by any studies.   . Shortness of breath      Past Surgical History  Procedure Laterality Date  . Ectopic pregnancy surgery  1999  . Cardiac catheterization  02/07/12  . Hernia repair  [address]    umbilical  . Epidermoid cyst excision  2007     left breast  . Esophagogastroduodenoscopy N/A 02/26/2013    Procedure: ESOPHAGOGASTRODUODENOSCOPY (EGD);  Surgeon: Irene Shipper, MD;  Location: [facility] ENDOSCOPY;  Service: Endoscopy;  Laterality: N/A;  . Breast cyst excision Left 2007    epidermoid  . Left and right heart catheterization with coronary angiogram N/A 02/07/2012    Procedure: LEFT AND RIGHT HEART CATHETERIZATION WITH CORONARY ANGIOGRAM; Cardiac cath on 02/07/2012 showed normal coronary arteries Surgeon: Laverda Page, MD;  Location: [facility] CATH LAB;  Service: Cardiovascular;  Laterality: N/A;     Family History  Problem Relation Age of Onset  . Diabetes Mother   . Hypertension Other   . Stroke Other   .  Arthritis Other   . Ovarian cancer Sister 49    maternal half-sister; RAD51C positive  . Breast cancer Maternal Aunt 58    currently 40  . Stomach cancer Maternal Uncle     dx 77s; deceased  . Cancer Paternal Aunt     unk. primary; deceased 58s  . Prostate cancer Paternal Uncle     deceased 49s  . Breast cancer Cousin     deceased 68s; daughter of mat uncle with stomach ca  . Prostate cancer Maternal Uncle     deceased 69s  . Breast cancer Maternal Aunt     deceased 35s  . Cancer Maternal Aunt     unk. primary  . Cancer Maternal Aunt     unk. primary  . Ovarian  cancer Maternal Aunt     deceased 10s     Social History: History   Social History  . Marital Status: Single    Spouse Name: N/A    Number of Children: N/A  . Years of Education: N/A   Occupational History  .      student   Social History Main Topics  . Smoking status: Former Smoker -- 3 years    Types: Cigarettes    Quit date: 06/01/1990  . Smokeless tobacco: Never Used  . Alcohol Use: No  . Drug Use: No  . Sexual Activity:    Partners: Male    Birth Control/ Protection: None   Other Topics Concern  . Not on file   Social History Narrative   Regular exercise-yes     Prescriptions prior to admission  Medication Sig Dispense Refill Last Dose  . albuterol (PROAIR HFA) 108 (90 BASE) MCG/ACT inhaler Inhale 2 puffs into the lungs every 6 (six) hours as needed for wheezing or shortness of breath.    Past Month at Unknown time  . ALPRAZolam (XANAX) 0.5 MG tablet Take 0.5 mg by mouth 2 (two) times daily as needed for sleep or anxiety.    07/26/2014 at Unknown time  . aspirin EC 81 MG tablet Take 81 mg by mouth daily.   07/27/2014 at Unknown time  . azaTHIOprine (IMURAN) 50 MG tablet Take 100 mg by mouth 2 (two) times daily.    07/27/2014 at Unknown time  . CARAFATE 1 GM/10ML suspension Take 10 mLs by mouth 4 (four) times daily. with meals and at bedtime  3 07/26/2014 at Unknown time  . carvedilol (COREG) 3.125 MG tablet Take 3.125 mg by mouth 2 (two) times daily with a meal.   07/27/2014 at 1100  . cetirizine (ZYRTEC) 10 MG tablet Take 10 mg by mouth daily.   07/27/2014 at Unknown time  . clopidogrel (PLAVIX) 75 MG tablet Take 75 mg by mouth daily.   07/27/2014 at Unknown time  . cycloSPORINE (RESTASIS) 0.05 % ophthalmic emulsion Place 1 drop into both eyes 3 (three) times daily.   07/27/2014 at Unknown time  . famotidine (PEPCID) 20 MG tablet Take 20 mg by mouth 2 (two) times daily.   07/27/2014 at Unknown time  . hydroxychloroquine (PLAQUENIL) 200 MG tablet Take 200 mg by  mouth 2 (two) times daily.    07/27/2014 at Unknown time  . insulin regular human CONCENTRATED (HUMULIN R) 500 UNIT/ML SOLN injection Inject 0.12 mLs (60 Units total) into the skin 3 (three) times daily with meals. 20 mL 0 07/27/2014 at Unknown time  . isosorbide-hydrALAZINE (BIDIL) 20-37.5 MG per tablet Take 1 tablet by mouth 3 (three) times daily.  07/27/2014 at Unknown time  . losartan (COZAAR) 100 MG tablet Take 100 mg by mouth daily.  3 07/27/2014 at Unknown time  . metFORMIN (GLUCOPHAGE-XR) 750 MG 24 hr tablet Take 750 mg by mouth 2 (two) times daily.  3 07/27/2014 at Unknown time  . morphine (MSIR) 15 MG tablet Take 15 mg by mouth every 4 (four) hours as needed for severe pain.   07/26/2014 at Unknown time  . omeprazole (PRILOSEC) 40 MG capsule Take 1 capsule (40 mg total) by mouth daily. 30 capsule 1 07/27/2014 at Unknown time  . ondansetron (ZOFRAN-ODT) 4 MG disintegrating tablet Take 4 mg by mouth every 8 (eight) hours as needed for nausea or vomiting.   07/27/2014 at Unknown time  . Polyvinyl Alcohol-Povidone (REFRESH OP) Place 1 drop into both eyes 2 (two) times daily.   07/27/2014 at Unknown time  . potassium chloride SA (K-DUR,KLOR-CON) 20 MEQ tablet Take 20 mEq by mouth 2 (two) times daily.   07/27/2014 at Unknown time  . predniSONE (DELTASONE) 5 MG tablet Take 5 mg by mouth daily with breakfast.   07/27/2014 at Unknown time  . pregabalin (LYRICA) 25 MG capsule Take 50 mg by mouth 3 (three) times daily.    07/27/2014 at Unknown time  . torsemide (DEMADEX) 20 MG tablet Take 1 tablet (20 mg total) by mouth 2 (two) times daily. 30 tablet 0 07/27/2014 at Unknown time  . Albiglutide (TANZEUM) 30 MG PEN Inject 30 mg into the skin once a week. Sundays   07/20/2014  . albuterol (PROVENTIL) (5 MG/ML) 0.5% nebulizer solution Take by nebulization every 6 (six) hours as needed.  5 07/22/2014  . alendronate (FOSAMAX) 70 MG tablet Take 70 mg by mouth every Saturday. saturday. Take with a full glass  of water on an empty stomach.   07/19/2014  . citalopram (CELEXA) 10 MG tablet Take 1 tablet (10 mg total) by mouth every evening. 30 tablet 0 07/25/2014  . EVZIO 0.4 MG/0.4ML SOAJ Inject 0.4 mg as directed.  0 never at Unknown time  . medroxyPROGESTERone (PROVERA) 10 MG tablet Take 2 tablets (20 mg total) by mouth 3 (three) times daily. (Patient not taking: Reported on 07/27/2014) 84 tablet 0 Not Taking at Unknown time    Scheduled Meds: . azaTHIOprine  100 mg Oral BID  . carvedilol  3.125 mg Oral BID WC  . citalopram  10 mg Oral QPM  . clopidogrel  75 mg Oral Daily  . cycloSPORINE  1 drop Both Eyes 3 times per day  . diclofenac sodium  2 g Topical QID  . enoxaparin (LOVENOX) injection  80 mg Subcutaneous Daily  . famotidine  20 mg Oral BID  . hydroxychloroquine  200 mg Oral BID  . insulin aspart  0-20 Units Subcutaneous TID WC  . insulin aspart  0-5 Units Subcutaneous QHS  . insulin aspart  6 Units Subcutaneous TID WC  . insulin glargine  20 Units Subcutaneous QHS  . isosorbide-hydrALAZINE  1 tablet Oral TID  . loratadine  10 mg Oral Daily  . losartan  100 mg Oral Daily  . metFORMIN  750 mg Oral BID WC  . potassium chloride SA  20 mEq Oral BID  . predniSONE  5 mg Oral Q breakfast  . pregabalin  50 mg Oral TID  . sodium chloride  3 mL Intravenous Q12H  . sodium chloride  3 mL Intravenous Q12H  . sucralfate  1 g Oral TID AC & HS  . torsemide  20  mg Oral BID   Continuous Infusions:  PRN Meds:.sodium chloride, acetaminophen **OR** acetaminophen, albuterol, ALPRAZolam, alum & mag hydroxide-simeth, morphine injection, ondansetron **OR** ondansetron (ZOFRAN) IV, sodium chloride  ROS: General: no fevers/chills/night sweats Eyes: no blurry vision, diplopia, or amaurosis ENT: no sore throat or hearing loss Resp: chronic cough and SOB, no wheezing or hemoptysis CV: chest pain which has resolved, no edema or palpitations GI: nausea and vomiting in ED which has resoled, no abdominal  pain, diarrhea, or constipation GU: no dysuria, frequency, or hematuria Skin: no rash Neuro: no headache, numbness, tingling, or weakness of extremities Musculoskeletal: diffuse joint pain which is chronic Heme: no bleeding, DVT, or easy bruising Endo: no polydipsia or polyuria    Physical Exam: Blood pressure 115/75, pulse 93, temperature 98.7 F (37.1 C), temperature source Oral, resp. rate 18, height _0  (1.727 m), weight 182.1 kg (401 lb 7.3 oz), last menstrual period 07/13/2014, SpO2 100 %.   General appearance: alert, cooperative, appears older than stated age, no distress and morbidly obese Neck: no adenopathy, no carotid bruit, no JVD, supple, symmetrical, trachea midline and thyroid not enlarged, symmetric, no tenderness/mass/nodules Lungs: clear to auscultation bilaterally Chest wall: no tenderness Heart: regular rate and rhythm, S1, S2 normal, no murmur, click, rub or gallop  Abdomen: Pannus present. Obese. No acute tenderness. BS positive in all 4 quadrants Extremities: edema trace LE edema, bilaterally Pulses: 2+ and symmetric Skin: Skin color, texture, turgor normal. No rashes or lesions  Labs:   Lab Results  Component Value Date   WBC 10.3 07/28/2014   HGB 9.8* 07/28/2014   HCT 31.0* 07/28/2014   MCV 84.5 07/28/2014   PLT 377 07/28/2014    Recent Labs Lab 07/28/14 0640  NA 141  K 3.8  CL 105  CO2 28  BUN 8  CREATININE 1.00  CALCIUM 8.5  GLUCOSE 145*   Lab Results  Component Value Date   CKTOTAL 153 01/14/2014   CKMB 8.6* 11/09/2011   TROPONINI <0.03 07/28/2014    Lipid Panel     Component Value Date/Time   CHOL 160 03/06/2014 1425   TRIG 125 03/06/2014 1425   HDL 57 03/06/2014 1425   CHOLHDL 2.8 03/06/2014 1425   VLDL 25 03/06/2014 1425   LDLCALC 78 03/06/2014 1425   BNP (last 3 results)  Recent Labs  01/13/14 0312  PROBNP 19.2     EKG: normal sinus rhythm at a rate of 87 bpm, diffuse, non-specific T-wave abnormality, unchanged  from EKG on 07/21/2014 in office.    Radiology: Dg Chest 2 View  07/27/2014   CLINICAL DATA:  Chest pain for 1 day.  EXAM: CHEST  2 VIEW  COMPARISON:  PA and lateral chest 04/27/2014.  CT chest 04/19/2013.  FINDINGS: There is cardiomegaly but no edema. Lungs are clear. No pneumothorax or pleural effusion.  IMPRESSION: Cardiomegaly without acute disease.   Electronically Signed   By: Inge Rise M.D.   On: 07/27/2014 18:10      ASSESSMENT AND PLAN:  1. Chest pain, atypical: serial troponins negative, suspect non-cardiac etiology. Will follow up as planned outpatient. Cardiac cath on 02/07/2012 showed normal coronary arteries. Echo 03/2014 EF 55-60% with repeat echocardiogram scheduled outpatient next week. 2. Neck pain: appears musculoskeletal. 3. Chronic diastolic heart failure: no signs of acute decompensation, continue present medications, follow up outpatient as scheduled. 4. DM-II uncontrolled 5. Morbid obesity with obesity hypoventilation syndrome.  Otherwise stable for discharge from our standpoint.  Rachel Bo, NP-C 07/29/2014, 9:01 AM  [facility], [place] Pager: [phone] Office: [phone]  I have personally reviewed the patient's record and performed physical exam and agree with the assessment and plan of Ms. Neldon Labella, NP-C [facility]

## 2014-07-29 NOTE — Progress Notes (Signed)
Pt A&O x4; pt discharge education and instructions completed with pt and family at bedside. All voices understanding and denies any questions. Pt IV and telemetry removed; pt discharge home with family to drive pt home after pt picks her car up from [address] parking. Pt given her prescription for Voltaren as well as zofran upon requesting and contacting MD. Pt transported off unit via wheelchair with belongings and family to the side. Francis Gaines Sekai Nayak RN.

## 2014-07-31 ENCOUNTER — Telehealth: Payer: Self-pay | Admitting: *Deleted

## 2014-07-31 ENCOUNTER — Other Ambulatory Visit: Payer: Self-pay | Admitting: *Deleted

## 2014-07-31 ENCOUNTER — Ambulatory Visit: Payer: Self-pay | Admitting: Obstetrics & Gynecology

## 2014-07-31 DIAGNOSIS — N939 Abnormal uterine and vaginal bleeding, unspecified: Secondary | ICD-10-CM

## 2014-07-31 MED ORDER — MEDROXYPROGESTERONE ACETATE 10 MG PO TABS: 20.0000 mg | ORAL_TABLET | Freq: Three times a day (TID) | ORAL | Status: DC

## 2014-07-31 NOTE — Telephone Encounter (Signed)
Provera sent to pharmacy to stop patients bleeding. Patient notified that Dr. Delsa Sale wanted to make sure that she is not pregnant. Patient states she is sexually active but that she is still taking birth control pills even though they made her sick. Patient notified we just needed to be sure she wasn't pregnant. Patient voiced understanding.

## 2014-08-05 ENCOUNTER — Other Ambulatory Visit: Payer: Self-pay | Admitting: Endocrinology

## 2014-08-11 ENCOUNTER — Telehealth: Payer: Self-pay | Admitting: *Deleted

## 2014-08-11 NOTE — Telephone Encounter (Signed)
Received faxed medication refill request for Nuva ring last week. Upon review request was given to Dr Jodi Mourning for review. Dr Jodi Mourning would rather patient use a none estrogen birth control due to her present medical condition and has denied refill request.  Call to patient -informed that the provider is recommending a non estrogen birth control. Patient does not want suggested treatments and will look for care elsewhere- will send for records.

## 2014-09-16 ENCOUNTER — Ambulatory Visit: Payer: Medicaid Other | Admitting: Endocrinology

## 2014-09-22 ENCOUNTER — Ambulatory Visit (INDEPENDENT_AMBULATORY_CARE_PROVIDER_SITE_OTHER): Payer: Medicaid Other | Admitting: Endocrinology

## 2014-09-22 VITALS — BP 138/84 | HR 88 | Temp 98.2°F | Ht 68.0 in | Wt 399.0 lb

## 2014-09-22 DIAGNOSIS — E1042 Type 1 diabetes mellitus with diabetic polyneuropathy: Secondary | ICD-10-CM

## 2014-09-22 NOTE — Progress Notes (Signed)
Subjective:    Patient ID: [name], female    DOB: 12-07-1970, 44 y.o.   MRN: [id number]  HPI Pt returns for f/u of diabetes mellitus: DM type: Insulin-requiring type 2 Dx'ed: 3354 Complications: none Therapy: insulin (since 2014), tanzeum, and metformin GDM: never DKA: never Severe hypoglycemia: never Pancreatitis: never Other: she is too ill to undergo weight-loss surgery; she takes multiple daily injections of U-500; in early 2015, she was hospitalized with n/v, due to gastroparesis; later in 2015, Dr Criss Rosales added metformin and tanzeum.  Pt says she is not at risk for pregnancy.  she takes multiple daily injections Interval history: no cbg record, but states cbg's are well-controlled.  She feels better, since a recent hospitalization for chest pain.  prednisone is back down to 5 mg daily.  Past Medical History  Diagnosis Date  . Bell's palsy 08/09/2010  . SLE 08/09/2010  . SJOGREN'S SYNDROME 08/09/2010  . Hypertension   . High cholesterol   . Anginal pain   . Obstructive sleep apnea on CPAP 2011  . Anxiety   . GERD (gastroesophageal reflux disease)   . CHF (congestive heart failure)   . Acute renal failure      Intractable nausea vomiting secondary to diabetic gastroparesis causing dehydration and acute renal failure /notes 04/01/2013  . Liver mass     biopsied 03/2013 at [facility], not malignant.  is to undergo radiologic ablation of the mass in sept/October 2014.   Marland Kitchen Pneumonia 2000's    "once" (04/01/2013)  . On home oxygen therapy     "2L 24/7" (04/01/2013)  . Migraines     "maybe a couple times/yr" (04/01/2013)  . Stroke 2010; 10/2012    "left side is still weak from it, never fully regained full strength; no additions from stroke 10/2012"  . Obesity   . Family history of malignant neoplasm of breast   . Family history of malignant neoplasm of ovary   . Lupus   . Chest pain 01/13/2014  . Diabetic gastroparesis associated with type 2 diabetes mellitus     this is  presumed diagnoses, not confirmed by any studies.   . Shortness of breath     Past Surgical History  Procedure Laterality Date  . Ectopic pregnancy surgery  1999  . Cardiac catheterization  02/07/12  . Hernia repair  [address]    umbilical  . Epidermoid cyst excision  2007     left breast  . Esophagogastroduodenoscopy N/A 02/26/2013    Procedure: ESOPHAGOGASTRODUODENOSCOPY (EGD);  Surgeon: Irene Shipper, MD;  Location: [facility] ENDOSCOPY;  Service: Endoscopy;  Laterality: N/A;  . Breast cyst excision Left 2007    epidermoid  . Left and right heart catheterization with coronary angiogram N/A 02/07/2012    Procedure: LEFT AND RIGHT HEART CATHETERIZATION WITH CORONARY ANGIOGRAM;  Surgeon: Laverda Page, MD;  Location: [facility] CATH LAB;  Service: Cardiovascular;  Laterality: N/A;    History   Social History  . Marital Status: Single    Spouse Name: N/A  . Number of Children: N/A  . Years of Education: N/A   Occupational History  .      student   Social History Main Topics  . Smoking status: Former Smoker -- 3 years    Types: Cigarettes    Quit date: 06/01/1990  . Smokeless tobacco: Never Used  . Alcohol Use: No  . Drug Use: No  . Sexual Activity:    Partners: Male    Patent examiner Protection: None  Other Topics Concern  . Not on file   Social History Narrative   Regular exercise-yes    Current Outpatient Prescriptions on File Prior to Visit  Medication Sig Dispense Refill  . Albiglutide (TANZEUM) 30 MG PEN Inject 30 mg into the skin once a week. Sundays    . albuterol (PROAIR HFA) 108 (90 BASE) MCG/ACT inhaler Inhale 2 puffs into the lungs every 6 (six) hours as needed for wheezing or shortness of breath.     Marland Kitchen albuterol (PROVENTIL) (5 MG/ML) 0.5% nebulizer solution Take by nebulization every 6 (six) hours as needed.  5  . alendronate (FOSAMAX) 70 MG tablet Take 70 mg by mouth every Saturday. saturday. Take with a full glass of water on an empty stomach.    . ALPRAZolam (XANAX)  0.5 MG tablet Take 0.5 mg by mouth 2 (two) times daily as needed for sleep or anxiety.     Marland Kitchen aspirin EC 81 MG tablet Take 81 mg by mouth daily.    Marland Kitchen azaTHIOprine (IMURAN) 50 MG tablet Take 100 mg by mouth 2 (two) times daily.     Marland Kitchen CARAFATE 1 GM/10ML suspension Take 10 mLs by mouth 4 (four) times daily. with meals and at bedtime  3  . carvedilol (COREG) 3.125 MG tablet Take 3.125 mg by mouth 2 (two) times daily with a meal.    . cetirizine (ZYRTEC) 10 MG tablet Take 10 mg by mouth daily.    . citalopram (CELEXA) 10 MG tablet Take 1 tablet (10 mg total) by mouth every evening. 30 tablet 0  . clopidogrel (PLAVIX) 75 MG tablet Take 75 mg by mouth daily.    . cycloSPORINE (RESTASIS) 0.05 % ophthalmic emulsion Place 1 drop into both eyes 3 (three) times daily.    . diclofenac sodium (VOLTAREN) 1 % GEL Apply 2 g topically 4 (four) times daily. Apply to back of left side of neck. 100 g 0  . EVZIO 0.4 MG/0.4ML SOAJ Inject 0.4 mg as directed.  0  . famotidine (PEPCID) 20 MG tablet Take 20 mg by mouth 2 (two) times daily.    Marland Kitchen HUMULIN R 500 UNIT/ML SOLN injection INJECT 0.12 MLS (60 UNITS TOTAL) INTO THE SKIN 3 (THREE) TIMES DAILY WITH MEALS. 20 mL 0  . hydroxychloroquine (PLAQUENIL) 200 MG tablet Take 200 mg by mouth 2 (two) times daily.     . isosorbide-hydrALAZINE (BIDIL) 20-37.5 MG per tablet Take 1 tablet by mouth 3 (three) times daily.    Marland Kitchen losartan (COZAAR) 100 MG tablet Take 100 mg by mouth daily.  3  . medroxyPROGESTERone (PROVERA) 10 MG tablet Take 2 tablets (20 mg total) by mouth 3 (three) times daily. 42 tablet 0  . metFORMIN (GLUCOPHAGE-XR) 750 MG 24 hr tablet Take 750 mg by mouth 2 (two) times daily.  3  . morphine (MSIR) 15 MG tablet Take 15 mg by mouth every 4 (four) hours as needed for severe pain.    Marland Kitchen omeprazole (PRILOSEC) 40 MG capsule Take 1 capsule (40 mg total) by mouth daily. 30 capsule 1  . ondansetron (ZOFRAN-ODT) 4 MG disintegrating tablet Take 1 tablet (4 mg total) by mouth  every 8 (eight) hours as needed for nausea or vomiting. 20 tablet 0  . Polyvinyl Alcohol-Povidone (REFRESH OP) Place 1 drop into both eyes 2 (two) times daily.    . potassium chloride SA (K-DUR,KLOR-CON) 20 MEQ tablet Take 20 mEq by mouth 2 (two) times daily.    . predniSONE (DELTASONE) 5 MG tablet  Take 5 mg by mouth daily with breakfast.    . pregabalin (LYRICA) 25 MG capsule Take 50 mg by mouth 3 (three) times daily.     Marland Kitchen torsemide (DEMADEX) 20 MG tablet Take 1 tablet (20 mg total) by mouth 2 (two) times daily. 30 tablet 0   No current facility-administered medications on file prior to visit.    Allergies  Allergen Reactions  . Metoclopramide Other (See Comments)    Developed restless leg, akathisia type limb movements.   . Oxycodone-Acetaminophen Hives    FYI: no reaction to hydrocodone; tolerates dilaudid  . Codeine Itching, Rash and Other (See Comments)    (Derm)    Family History  Problem Relation Age of Onset  . Diabetes Mother   . Hypertension Other   . Stroke Other   . Arthritis Other   . Ovarian cancer Sister 49    maternal half-sister; RAD51C positive  . Breast cancer Maternal Aunt 73    currently 24  . Stomach cancer Maternal Uncle     dx 33s; deceased  . Cancer Paternal Aunt     unk. primary; deceased 22s  . Prostate cancer Paternal Uncle     deceased 61s  . Breast cancer Cousin     deceased 63s; daughter of mat uncle with stomach ca  . Prostate cancer Maternal Uncle     deceased 33s  . Breast cancer Maternal Aunt     deceased 55s  . Cancer Maternal Aunt     unk. primary  . Cancer Maternal Aunt     unk. primary  . Ovarian cancer Maternal Aunt     deceased 60s    BP 138/84 mmHg  Pulse 88  Temp(Src) 98.2 F (36.8 C) (Oral)  Ht 5' 8"  (1.727 m)  Wt 399 lb (180.985 kg)  BMI 60.68 kg/m2  SpO2 98%  Review of Systems She denies hypoglycemia and weight change.      Objective:   Physical Exam VITAL SIGNS:  See vs page GENERAL: no distress.  Morbid  obesity.  Has 02 on Pulses: dorsalis pedis intact bilat.   MSK: no deformity of the feet CV: trace bilat leg edema Skin:  no ulcer on the feet.  normal color and temp on the feet.  Neuro: sensation is intact to touch on the feet.     Lab Results  Component Value Date   HGBA1C 7.1* 07/28/2014       Assessment & Plan:  Severe morbid obesity, persistent DM: well-controlled Gastroparesis: this could be exac by tanzeum and/or metformin.    Patient is advised the following: Patient Instructions  Please continue the same medications. check your blood sugar 2 times a day.  vary the time of day when you check, between before the 3 meals, and at bedtime.  also check if you have symptoms of your blood sugar being too high or too low.  please keep a record of the readings and bring it to your next appointment here.  please call us sooner if you are having low blood sugar episodes, or if it stays over 200.   Please come back for a follow-up appointment in 2 months.   The tanzeum and metformin could set off your stomach problems, so please stop them if you get nausea.   good diet and exercise significanly improve the control of your diabetes.  please let me know if you wish to be referred to a dietician.

## 2014-09-22 NOTE — Patient Instructions (Addendum)
Please continue the same medications. check your blood sugar 2 times a day.  vary the time of day when you check, between before the 3 meals, and at bedtime.  also check if you have symptoms of your blood sugar being too high or too low.  please keep a record of the readings and bring it to your next appointment here.  please call us sooner if you are having low blood sugar episodes, or if it stays over 200.   Please come back for a follow-up appointment in 2 months.   The tanzeum and metformin could set off your stomach problems, so please stop them if you get nausea.   good diet and exercise significanly improve the control of your diabetes.  please let me know if you wish to be referred to a dietician.

## 2014-10-15 ENCOUNTER — Emergency Department (HOSPITAL_COMMUNITY): Payer: Medicaid Other

## 2014-10-15 ENCOUNTER — Encounter (HOSPITAL_COMMUNITY): Payer: Self-pay | Admitting: Family Medicine

## 2014-10-15 ENCOUNTER — Emergency Department (HOSPITAL_COMMUNITY)
Admission: EM | Admit: 2014-10-15 | Discharge: 2014-10-15 | Disposition: A | Discharge status: Home / Self Care | Payer: Medicaid Other | Attending: Emergency Medicine | Admitting: Emergency Medicine

## 2014-10-15 DIAGNOSIS — Z7902 Long term (current) use of antithrombotics/antiplatelets: Secondary | ICD-10-CM | POA: Diagnosis not present

## 2014-10-15 DIAGNOSIS — I509 Heart failure, unspecified: Secondary | ICD-10-CM | POA: Insufficient documentation

## 2014-10-15 DIAGNOSIS — G43909 Migraine, unspecified, not intractable, without status migrainosus: Secondary | ICD-10-CM | POA: Diagnosis not present

## 2014-10-15 DIAGNOSIS — R112 Nausea with vomiting, unspecified: Secondary | ICD-10-CM | POA: Insufficient documentation

## 2014-10-15 DIAGNOSIS — G4733 Obstructive sleep apnea (adult) (pediatric): Secondary | ICD-10-CM | POA: Diagnosis not present

## 2014-10-15 DIAGNOSIS — E1143 Type 2 diabetes mellitus with diabetic autonomic (poly)neuropathy: Secondary | ICD-10-CM | POA: Insufficient documentation

## 2014-10-15 DIAGNOSIS — J029 Acute pharyngitis, unspecified: Secondary | ICD-10-CM

## 2014-10-15 DIAGNOSIS — E669 Obesity, unspecified: Secondary | ICD-10-CM | POA: Insufficient documentation

## 2014-10-15 DIAGNOSIS — Z87891 Personal history of nicotine dependence: Secondary | ICD-10-CM | POA: Insufficient documentation

## 2014-10-15 DIAGNOSIS — Z9981 Dependence on supplemental oxygen: Secondary | ICD-10-CM | POA: Insufficient documentation

## 2014-10-15 DIAGNOSIS — Z8701 Personal history of pneumonia (recurrent): Secondary | ICD-10-CM | POA: Insufficient documentation

## 2014-10-15 DIAGNOSIS — Z79899 Other long term (current) drug therapy: Secondary | ICD-10-CM | POA: Insufficient documentation

## 2014-10-15 DIAGNOSIS — Z8673 Personal history of transient ischemic attack (TIA), and cerebral infarction without residual deficits: Secondary | ICD-10-CM | POA: Insufficient documentation

## 2014-10-15 DIAGNOSIS — M542 Cervicalgia: Secondary | ICD-10-CM | POA: Diagnosis not present

## 2014-10-15 DIAGNOSIS — K219 Gastro-esophageal reflux disease without esophagitis: Secondary | ICD-10-CM | POA: Diagnosis not present

## 2014-10-15 DIAGNOSIS — R059 Cough, unspecified: Secondary | ICD-10-CM

## 2014-10-15 DIAGNOSIS — I10 Essential (primary) hypertension: Secondary | ICD-10-CM | POA: Diagnosis not present

## 2014-10-15 DIAGNOSIS — F419 Anxiety disorder, unspecified: Secondary | ICD-10-CM | POA: Diagnosis not present

## 2014-10-15 DIAGNOSIS — Z8742 Personal history of other diseases of the female genital tract: Secondary | ICD-10-CM | POA: Diagnosis not present

## 2014-10-15 DIAGNOSIS — M3210 Systemic lupus erythematosus, organ or system involvement unspecified: Secondary | ICD-10-CM | POA: Insufficient documentation

## 2014-10-15 DIAGNOSIS — I209 Angina pectoris, unspecified: Secondary | ICD-10-CM | POA: Insufficient documentation

## 2014-10-15 DIAGNOSIS — Z7982 Long term (current) use of aspirin: Secondary | ICD-10-CM | POA: Diagnosis not present

## 2014-10-15 DIAGNOSIS — Z794 Long term (current) use of insulin: Secondary | ICD-10-CM | POA: Diagnosis not present

## 2014-10-15 DIAGNOSIS — Z9889 Other specified postprocedural states: Secondary | ICD-10-CM | POA: Diagnosis not present

## 2014-10-15 DIAGNOSIS — Z7952 Long term (current) use of systemic steroids: Secondary | ICD-10-CM | POA: Diagnosis not present

## 2014-10-15 DIAGNOSIS — R05 Cough: Secondary | ICD-10-CM

## 2014-10-15 LAB — COMPREHENSIVE METABOLIC PANEL
ALBUMIN: 3 g/dL — AB (ref 3.5–5.2)
ALT: 9 U/L (ref 0–35)
ANION GAP: 7 (ref 5–15)
AST: 12 U/L (ref 0–37)
Alkaline Phosphatase: 67 U/L (ref 39–117)
BUN: 6 mg/dL (ref 6–23)
CO2: 24 mmol/L (ref 19–32)
CREATININE: 0.88 mg/dL (ref 0.50–1.10)
Calcium: 8.7 mg/dL (ref 8.4–10.5)
Chloride: 107 mmol/L (ref 96–112)
GFR calc non Af Amer: 79 mL/min — ABNORMAL LOW (ref >90)
GLUCOSE: 190 mg/dL — AB (ref 70–99)
Potassium: 3.4 mmol/L — ABNORMAL LOW (ref 3.5–5.1)
Sodium: 138 mmol/L (ref 135–145)
TOTAL PROTEIN: 6.6 g/dL (ref 6.0–8.3)
Total Bilirubin: 0.2 mg/dL — ABNORMAL LOW (ref 0.3–1.2)

## 2014-10-15 LAB — CBC WITH DIFFERENTIAL/PLATELET
BASOS ABS: 0 10*3/uL (ref 0.0–0.1)
BASOS PCT: 0 % (ref 0–1)
Eosinophils Absolute: 0.3 10*3/uL (ref 0.0–0.7)
Eosinophils Relative: 3 % (ref 0–5)
HCT: 32.2 % — ABNORMAL LOW (ref 36.0–46.0)
Hemoglobin: 10.2 g/dL — ABNORMAL LOW (ref 12.0–15.0)
Lymphocytes Relative: 27 % (ref 12–46)
Lymphs Abs: 2.8 10*3/uL (ref 0.7–4.0)
MCH: 26 pg (ref 26.0–34.0)
MCHC: 31.7 g/dL (ref 30.0–36.0)
MCV: 82.1 fL (ref 78.0–100.0)
MONO ABS: 0.8 10*3/uL (ref 0.1–1.0)
Monocytes Relative: 8 % (ref 3–12)
NEUTROS ABS: 6.4 10*3/uL (ref 1.7–7.7)
Neutrophils Relative %: 62 % (ref 43–77)
Platelets: 371 10*3/uL (ref 150–400)
RBC: 3.92 MIL/uL (ref 3.87–5.11)
RDW: 15.6 % — AB (ref 11.5–15.5)
WBC: 10.4 10*3/uL (ref 4.0–10.5)

## 2014-10-15 MED ORDER — BENZONATATE 100 MG PO CAPS
100.0000 mg | ORAL_CAPSULE | Freq: Once | ORAL | Status: AC
  Administered 2014-10-15: 100 mg via ORAL
  Filled 2014-10-15: qty 1

## 2014-10-15 MED ORDER — ONDANSETRON 4 MG PO TBDP: 4.0000 mg | ORAL_TABLET | Freq: Three times a day (TID) | ORAL | Status: DC | PRN

## 2014-10-15 MED ORDER — KETOROLAC TROMETHAMINE 15 MG/ML IJ SOLN
15.0000 mg | Freq: Once | INTRAMUSCULAR | Status: AC
  Administered 2014-10-15: 15 mg via INTRAVENOUS
  Filled 2014-10-15: qty 1

## 2014-10-15 MED ORDER — SODIUM CHLORIDE 0.9 % IV BOLUS (SEPSIS)
2000.0000 mL | Freq: Once | INTRAVENOUS | Status: AC
  Administered 2014-10-15: 2000 mL via INTRAVENOUS

## 2014-10-15 MED ORDER — MORPHINE SULFATE 4 MG/ML IJ SOLN
8.0000 mg | Freq: Once | INTRAMUSCULAR | Status: AC
  Administered 2014-10-15: 8 mg via INTRAVENOUS
  Filled 2014-10-15: qty 2

## 2014-10-15 MED ORDER — BENZONATATE 100 MG PO CAPS: 100.0000 mg | ORAL_CAPSULE | Freq: Two times a day (BID) | ORAL | Status: DC | PRN

## 2014-10-15 MED ORDER — ONDANSETRON HCL 4 MG/2ML IJ SOLN
4.0000 mg | Freq: Once | INTRAMUSCULAR | Status: AC
  Administered 2014-10-15: 4 mg via INTRAVENOUS
  Filled 2014-10-15: qty 2

## 2014-10-15 NOTE — Discharge Instructions (Signed)

## 2014-10-15 NOTE — ED Notes (Signed)
Pt having cough, sore throat and vomiting worsening x 2 weeks. sts unable to keep anything down.

## 2014-10-15 NOTE — ED Provider Notes (Signed)
CSN: [id number]     Arrival date & time 10/15/14  1605 History   First MD Initiated Contact with Patient 10/15/14 1827     Chief Complaint  Patient presents with  . Sore Throat  . Cough  . Emesis     (Consider location/radiation/quality/duration/timing/severity/associated sxs/prior Treatment) Patient is a 44 y.o. female presenting with URI. The history is provided by the patient.  URI Presenting symptoms: congestion, cough and sore throat   Presenting symptoms: no fatigue and no fever   Severity:  Moderate Onset quality:  Gradual Duration:  5 days Timing:  Constant Progression:  Unchanged Chronicity:  New Relieved by:  None tried Worsened by:  Eating and drinking Ineffective treatments:  None tried Associated symptoms: neck pain   Associated symptoms: no arthralgias, no headaches, no myalgias, no swollen glands and no wheezing   Risk factors: chronic kidney disease, chronic respiratory disease and diabetes mellitus     Past Medical History  Diagnosis Date  . Bell's palsy 08/09/2010  . SLE 08/09/2010  . SJOGREN'S SYNDROME 08/09/2010  . Hypertension   . High cholesterol   . Anginal pain   . Obstructive sleep apnea on CPAP 2011  . Anxiety   . GERD (gastroesophageal reflux disease)   . CHF (congestive heart failure)   . Acute renal failure      Intractable nausea vomiting secondary to diabetic gastroparesis causing dehydration and acute renal failure /notes 04/01/2013  . Liver mass     biopsied 03/2013 at [facility], not malignant.  is to undergo radiologic ablation of the mass in sept/October 2014.   Marland Kitchen Pneumonia 2000's    "once" (04/01/2013)  . On home oxygen therapy     "2L 24/7" (04/01/2013)  . Migraines     "maybe a couple times/yr" (04/01/2013)  . Stroke 2010; 10/2012    "left side is still weak from it, never fully regained full strength; no additions from stroke 10/2012"  . Obesity   . Family history of malignant neoplasm of breast   . Family history of malignant  neoplasm of ovary   . Lupus   . Chest pain 01/13/2014  . Diabetic gastroparesis associated with type 2 diabetes mellitus     this is presumed diagnoses, not confirmed by any studies.   . Shortness of breath    Past Surgical History  Procedure Laterality Date  . Ectopic pregnancy surgery  1999  . Cardiac catheterization  02/07/12  . Hernia repair  8242'P    umbilical  . Epidermoid cyst excision  2007     left breast  . Esophagogastroduodenoscopy N/A 02/26/2013    Procedure: ESOPHAGOGASTRODUODENOSCOPY (EGD);  Surgeon: Irene Shipper, MD;  Location: [facility] ENDOSCOPY;  Service: Endoscopy;  Laterality: N/A;  . Breast cyst excision Left 2007    epidermoid  . Left and right heart catheterization with coronary angiogram N/A 02/07/2012    Procedure: LEFT AND RIGHT HEART CATHETERIZATION WITH CORONARY ANGIOGRAM;  Surgeon: Laverda Page, MD;  Location: [facility] CATH LAB;  Service: Cardiovascular;  Laterality: N/A;   Family History  Problem Relation Age of Onset  . Diabetes Mother   . Hypertension Other   . Stroke Other   . Arthritis Other   . Ovarian cancer Sister 24    maternal half-sister; RAD51C positive  . Breast cancer Maternal Aunt 76    currently 29  . Stomach cancer Maternal Uncle     dx 45s; deceased  . Cancer Paternal Aunt     unk. primary;  deceased 50s  . Prostate cancer Paternal Uncle     deceased 69s  . Breast cancer Cousin     deceased 14s; daughter of mat uncle with stomach ca  . Prostate cancer Maternal Uncle     deceased 64s  . Breast cancer Maternal Aunt     deceased 13s  . Cancer Maternal Aunt     unk. primary  . Cancer Maternal Aunt     unk. primary  . Ovarian cancer Maternal Aunt     deceased 54s   History  Substance Use Topics  . Smoking status: Former Smoker -- 3 years    Types: Cigarettes    Quit date: 06/01/1990  . Smokeless tobacco: Never Used  . Alcohol Use: No   OB History    Gravida Para Term Preterm AB TAB SAB Ectopic Multiple Living   _0 Review of Systems  Constitutional: Negative for fever and fatigue.  HENT: Positive for congestion and sore throat.   Respiratory: Positive for cough. Negative for wheezing.   Musculoskeletal: Positive for neck pain. Negative for myalgias and arthralgias.  Neurological: Negative for headaches.  All other systems reviewed and are negative.     Allergies  Metoclopramide; Oxycodone-acetaminophen; and Codeine  Home Medications   Prior to Admission medications   Medication Sig Start Date End Date Taking? Authorizing Provider  Albiglutide (TANZEUM) 30 MG PEN Inject 30 mg into the skin once a week. Sundays   Yes Historical Provider, MD  albuterol (PROAIR HFA) 108 (90 BASE) MCG/ACT inhaler Inhale 2 puffs into the lungs every 6 (six) hours as needed for wheezing or shortness of breath.    Yes Historical Provider, MD  albuterol (PROVENTIL) (5 MG/ML) 0.5% nebulizer solution Take 2.5 mg by nebulization every 6 (six) hours as needed for wheezing or shortness of breath.  06/24/14  Yes Historical Provider, MD  alendronate (FOSAMAX) 70 MG tablet Take 70 mg by mouth every Saturday. Take with a full glass of water on an empty stomach.   Yes Historical Provider, MD  ALPRAZolam Duanne Moron) 0.5 MG tablet Take 0.5 mg by mouth 2 (two) times daily as needed for sleep or anxiety.    Yes Historical Provider, MD  aspirin EC 81 MG tablet Take 81 mg by mouth daily.   Yes Historical Provider, MD  azaTHIOprine (IMURAN) 50 MG tablet Take 100 mg by mouth 2 (two) times daily.    Yes Historical Provider, MD  carvedilol (COREG) 3.125 MG tablet Take 3.125 mg by mouth 2 (two) times daily with a meal.   Yes Historical Provider, MD  cetirizine (ZYRTEC) 10 MG tablet Take 10 mg by mouth daily.   Yes Historical Provider, MD  citalopram (CELEXA) 10 MG tablet Take 1 tablet (10 mg total) by mouth every evening. 04/06/13  Yes Belkys A Regalado, MD  clopidogrel (PLAVIX) 75 MG tablet Take 75 mg by mouth daily.   Yes Historical Provider,  MD  cyclobenzaprine (FLEXERIL) 10 MG tablet Take 10 mg by mouth 2 (two) times daily as needed for muscle spasms.  07/30/14  Yes Historical Provider, MD  cycloSPORINE (RESTASIS) 0.05 % ophthalmic emulsion Place 1 drop into both eyes 3 (three) times daily.   Yes Historical Provider, MD  diclofenac sodium (VOLTAREN) 1 % GEL Apply 2 g topically 4 (four) times daily. Apply to back of left side of neck. 07/29/14  Yes Modena Jansky, MD  etonogestrel-ethinyl estradiol (NUVARING) 0.12-0.015 MG/24HR  vaginal ring Place 1 each vaginally every 28 (twenty-eight) days. Insert vaginally and leave in place for 3 consecutive weeks, then remove for 1 week. (last inserted 10/01/14)   Yes Historical Provider, MD  famotidine (PEPCID) 20 MG tablet Take 20 mg by mouth 2 (two) times daily.   Yes Historical Provider, MD  HUMULIN R 500 UNIT/ML SOLN injection INJECT 0.12 MLS (60 UNITS TOTAL) INTO THE SKIN 3 (THREE) TIMES DAILY WITH MEALS. 08/06/14  Yes Renato Shin, MD  hydroxychloroquine (PLAQUENIL) 200 MG tablet Take 200 mg by mouth 2 (two) times daily.    Yes Historical Provider, MD  isosorbide-hydrALAZINE (BIDIL) 20-37.5 MG per tablet Take 1 tablet by mouth 3 (three) times daily.   Yes Historical Provider, MD  losartan (COZAAR) 100 MG tablet Take 100 mg by mouth daily. 06/18/14  Yes Historical Provider, MD  medroxyPROGESTERone (PROVERA) 10 MG tablet Take 2 tablets (20 mg total) by mouth 3 (three) times daily. 07/31/14  Yes Lahoma Crocker, MD  metFORMIN (GLUCOPHAGE-XR) 750 MG 24 hr tablet Take 750 mg by mouth 2 (two) times daily. 07/07/14  Yes Historical Provider, MD  morphine (MSIR) 15 MG tablet Take 15 mg by mouth every 4 (four) hours as needed for severe pain.   Yes Historical Provider, MD  Naloxone HCl 0.4 MG/0.4ML SOAJ Inject 0.4 mg as directed as needed (overdose). Evzio   Yes Historical Provider, MD  Polyvinyl Alcohol-Povidone (REFRESH OP) Place 1 drop into both eyes 2 (two) times daily.   Yes Historical Provider, MD   potassium chloride (MICRO-K) 10 MEQ CR capsule Take 10 mEq by mouth 2 (two) times daily.  08/21/14  Yes Historical Provider, MD  predniSONE (DELTASONE) 5 MG tablet Take 5 mg by mouth daily with breakfast.   Yes Historical Provider, MD  pregabalin (LYRICA) 75 MG capsule Take 75 mg by mouth 3 (three) times daily.   Yes Historical Provider, MD  sucralfate (CARAFATE) 1 GM/10ML suspension Take 1 g by mouth 4 (four) times daily -  with meals and at bedtime.   Yes Historical Provider, MD  torsemide (DEMADEX) 20 MG tablet Take 1 tablet (20 mg total) by mouth 2 (two) times daily. 04/06/13  Yes Belkys A Regalado, MD  benzonatate (TESSALON) 100 MG capsule Take 1 capsule (100 mg total) by mouth 2 (two) times daily as needed for cough. 10/15/14   Larence Penning, MD  omeprazole (PRILOSEC) 40 MG capsule Take 1 capsule (40 mg total) by mouth daily. Patient not taking: Reported on 10/15/2014 01/15/14   Barton Dubois, MD  ondansetron (ZOFRAN-ODT) 4 MG disintegrating tablet Take 1 tablet (4 mg total) by mouth every 8 (eight) hours as needed for nausea or vomiting. 10/15/14   Larence Penning, MD   BP 131/49 mmHg  Pulse 80  Temp(Src) 97.8 F (36.6 C) (Oral)  Resp 18  SpO2 100%  LMP 09/17/2014 (Approximate) Physical Exam  Constitutional: She appears well-developed and well-nourished. No distress.  HENT:  Head: Normocephalic and atraumatic.  Mouth/Throat: Oropharynx is clear and moist. No oropharyngeal exudate.  Mild posterior oropharyngeal erythema  Eyes: Pupils are equal, round, and reactive to light.  Neck: Normal range of motion. Neck supple.  Cardiovascular: Normal rate, regular rhythm, normal heart sounds and intact distal pulses.  Exam reveals no gallop and no friction rub.   No murmur heard. Pulmonary/Chest: Effort normal and breath sounds normal. No respiratory distress. She has no wheezes. She has no rales.  No increased work of breathing on home 2L O2  Abdominal: Soft. Bowel  sounds are normal. She exhibits no  distension. There is no tenderness.  Musculoskeletal: Normal range of motion. She exhibits no edema or tenderness.  Lymphadenopathy:    She has cervical adenopathy.  Skin: Skin is warm and dry. No rash noted. She is not diaphoretic.  Psychiatric: She has a normal mood and affect. Her behavior is normal. Judgment and thought content normal.  Nursing note and vitals reviewed.   ED Course  Procedures (including critical care time) Labs Review Labs Reviewed  CBC WITH DIFFERENTIAL/PLATELET - Abnormal; Notable for the following:    Hemoglobin 10.2 (*)    HCT 32.2 (*)    RDW 15.6 (*)    All other components within normal limits  COMPREHENSIVE METABOLIC PANEL - Abnormal; Notable for the following:    Potassium 3.4 (*)    Glucose, Bld 190 (*)    Albumin 3.0 (*)    Total Bilirubin 0.2 (*)    GFR calc non Af Amer 79 (*)    All other components within normal limits    Imaging Review Dg Chest 2 View  10/15/2014   CLINICAL DATA:  Chest pain, cough and shortness of breath.  EXAM: CHEST - 2 VIEW  COMPARISON:  07/27/2014  FINDINGS: The heart size is at the upper limits of normal. There is no evidence of pulmonary edema, consolidation, pneumothorax, nodule or pleural fluid. The visualized skeletal structures are unremarkable.  IMPRESSION: No active disease.   Electronically Signed   By: Aletta Edouard M.D.   On: 10/15/2014 17:18     EKG Interpretation None      MDM   Final diagnoses:  Viral pharyngitis  Non-intractable vomiting with nausea, vomiting of unspecified type    44 year old female with a history of diabetes, hypertension, lupus with chronic pain and gastroparesis presents with pharyngitis and vomiting for 5 days. Denies fevers. Denies cough or shortness of breath or chest pain. She does endorse neck pain associated with the vomiting but otherwise asymptomatic. Emesis is nonbloody and nonbilious in usual following a coughing spell. She does not feel that it is similar to her  previous gastroparesis and feels that is more related to a sore throat with scratchy feeling in her throat and coughing induced vomiting.  Afebrile and vital signs stable on my exam. She has no respiratory distress on her chronic 2 L of oxygen. Oropharyngeal exam without PTA or tonsillar exudate. Mild erythema noted. No Ludwig angina. Given afebrile, cough, pharyngitis, viral pharyngitis most likely. Laboratory workup as well as chest x-ray obtained from triage and found to be unremarkable. We will attempt symptomatic control with fluid bolus as well as anti-inflammatory, Zofran and then by mouth challenge.  Symptomatically much improved with treatment. She is able to swallow pills without nausea or vomiting and without dysphasia. We'll discharge her to home with continued symptomatic therapy and PCP follow-up.  Larence Penning, MD 10/15/14 2136  Alfonzo Beers, MD 10/16/14 [phone]

## 2014-11-21 ENCOUNTER — Ambulatory Visit (INDEPENDENT_AMBULATORY_CARE_PROVIDER_SITE_OTHER): Payer: Medicaid Other | Admitting: Endocrinology

## 2014-11-21 ENCOUNTER — Encounter (HOSPITAL_COMMUNITY): Payer: Self-pay | Admitting: Emergency Medicine

## 2014-11-21 ENCOUNTER — Emergency Department (HOSPITAL_COMMUNITY)
Admission: EM | Admit: 2014-11-21 | Discharge: 2014-11-22 | Disposition: A | Discharge status: Home / Self Care | Payer: Medicaid Other | Attending: Emergency Medicine | Admitting: Emergency Medicine

## 2014-11-21 ENCOUNTER — Ambulatory Visit: Payer: Medicaid Other | Admitting: Endocrinology

## 2014-11-21 ENCOUNTER — Emergency Department (HOSPITAL_COMMUNITY): Payer: Medicaid Other

## 2014-11-21 ENCOUNTER — Telehealth: Payer: Self-pay | Admitting: Endocrinology

## 2014-11-21 VITALS — BP 134/68 | HR 103 | Temp 98.4°F | Ht 68.0 in | Wt >= 6400 oz

## 2014-11-21 DIAGNOSIS — I509 Heart failure, unspecified: Secondary | ICD-10-CM | POA: Diagnosis not present

## 2014-11-21 DIAGNOSIS — Z87891 Personal history of nicotine dependence: Secondary | ICD-10-CM | POA: Diagnosis not present

## 2014-11-21 DIAGNOSIS — Z9981 Dependence on supplemental oxygen: Secondary | ICD-10-CM | POA: Diagnosis not present

## 2014-11-21 DIAGNOSIS — G4733 Obstructive sleep apnea (adult) (pediatric): Secondary | ICD-10-CM | POA: Diagnosis not present

## 2014-11-21 DIAGNOSIS — Z8701 Personal history of pneumonia (recurrent): Secondary | ICD-10-CM | POA: Insufficient documentation

## 2014-11-21 DIAGNOSIS — F419 Anxiety disorder, unspecified: Secondary | ICD-10-CM | POA: Diagnosis not present

## 2014-11-21 DIAGNOSIS — Z7952 Long term (current) use of systemic steroids: Secondary | ICD-10-CM | POA: Insufficient documentation

## 2014-11-21 DIAGNOSIS — Z87448 Personal history of other diseases of urinary system: Secondary | ICD-10-CM | POA: Diagnosis not present

## 2014-11-21 DIAGNOSIS — Z7902 Long term (current) use of antithrombotics/antiplatelets: Secondary | ICD-10-CM | POA: Diagnosis not present

## 2014-11-21 DIAGNOSIS — E1143 Type 2 diabetes mellitus with diabetic autonomic (poly)neuropathy: Secondary | ICD-10-CM | POA: Insufficient documentation

## 2014-11-21 DIAGNOSIS — R112 Nausea with vomiting, unspecified: Secondary | ICD-10-CM | POA: Insufficient documentation

## 2014-11-21 DIAGNOSIS — I10 Essential (primary) hypertension: Secondary | ICD-10-CM | POA: Diagnosis not present

## 2014-11-21 DIAGNOSIS — Z8673 Personal history of transient ischemic attack (TIA), and cerebral infarction without residual deficits: Secondary | ICD-10-CM | POA: Insufficient documentation

## 2014-11-21 DIAGNOSIS — Z8739 Personal history of other diseases of the musculoskeletal system and connective tissue: Secondary | ICD-10-CM | POA: Insufficient documentation

## 2014-11-21 DIAGNOSIS — Z79899 Other long term (current) drug therapy: Secondary | ICD-10-CM | POA: Diagnosis not present

## 2014-11-21 DIAGNOSIS — E669 Obesity, unspecified: Secondary | ICD-10-CM | POA: Diagnosis not present

## 2014-11-21 DIAGNOSIS — Z9889 Other specified postprocedural states: Secondary | ICD-10-CM | POA: Diagnosis not present

## 2014-11-21 DIAGNOSIS — E1042 Type 1 diabetes mellitus with diabetic polyneuropathy: Secondary | ICD-10-CM | POA: Diagnosis not present

## 2014-11-21 DIAGNOSIS — Z3202 Encounter for pregnancy test, result negative: Secondary | ICD-10-CM | POA: Diagnosis not present

## 2014-11-21 DIAGNOSIS — Z794 Long term (current) use of insulin: Secondary | ICD-10-CM | POA: Insufficient documentation

## 2014-11-21 LAB — CBC WITH DIFFERENTIAL/PLATELET
Basophils Absolute: 0 10*3/uL (ref 0.0–0.1)
Basophils Relative: 0 % (ref 0–1)
EOS PCT: 1 % (ref 0–5)
Eosinophils Absolute: 0.1 10*3/uL (ref 0.0–0.7)
HCT: 31.1 % — ABNORMAL LOW (ref 36.0–46.0)
HEMOGLOBIN: 9.5 g/dL — AB (ref 12.0–15.0)
LYMPHS ABS: 2.2 10*3/uL (ref 0.7–4.0)
Lymphocytes Relative: 21 % (ref 12–46)
MCH: 25.7 pg — ABNORMAL LOW (ref 26.0–34.0)
MCHC: 30.5 g/dL (ref 30.0–36.0)
MCV: 84.3 fL (ref 78.0–100.0)
Monocytes Absolute: 0.7 10*3/uL (ref 0.1–1.0)
Monocytes Relative: 7 % (ref 3–12)
Neutro Abs: 7.5 10*3/uL (ref 1.7–7.7)
Neutrophils Relative %: 71 % (ref 43–77)
Platelets: 327 10*3/uL (ref 150–400)
RBC: 3.69 MIL/uL — AB (ref 3.87–5.11)
RDW: 15.9 % — ABNORMAL HIGH (ref 11.5–15.5)
WBC: 10.5 10*3/uL (ref 4.0–10.5)

## 2014-11-21 LAB — I-STAT TROPONIN, ED: TROPONIN I, POC: 0 ng/mL (ref 0.00–0.08)

## 2014-11-21 LAB — I-STAT CHEM 8, ED
BUN: 12 mg/dL (ref 6–23)
CHLORIDE: 107 mmol/L (ref 96–112)
CREATININE: 0.8 mg/dL (ref 0.50–1.10)
Calcium, Ion: 1.18 mmol/L (ref 1.12–1.23)
GLUCOSE: 132 mg/dL — AB (ref 70–99)
HCT: 36 % (ref 36.0–46.0)
Hemoglobin: 12.2 g/dL (ref 12.0–15.0)
Potassium: 4.2 mmol/L (ref 3.5–5.1)
Sodium: 142 mmol/L (ref 135–145)
TCO2: 21 mmol/L (ref 0–100)

## 2014-11-21 LAB — COMPREHENSIVE METABOLIC PANEL
ALT: 11 U/L (ref 0–35)
ANION GAP: 9 (ref 5–15)
AST: 16 U/L (ref 0–37)
Albumin: 2.9 g/dL — ABNORMAL LOW (ref 3.5–5.2)
Alkaline Phosphatase: 69 U/L (ref 39–117)
BUN: 10 mg/dL (ref 6–23)
CALCIUM: 9 mg/dL (ref 8.4–10.5)
CO2: 23 mmol/L (ref 19–32)
CREATININE: 0.82 mg/dL (ref 0.50–1.10)
Chloride: 107 mmol/L (ref 96–112)
GFR calc non Af Amer: 86 mL/min — ABNORMAL LOW (ref >90)
GLUCOSE: 113 mg/dL — AB (ref 70–99)
POTASSIUM: 4.3 mmol/L (ref 3.5–5.1)
Sodium: 139 mmol/L (ref 135–145)
TOTAL PROTEIN: 6.9 g/dL (ref 6.0–8.3)
Total Bilirubin: 0.3 mg/dL (ref 0.3–1.2)

## 2014-11-21 LAB — URINALYSIS, ROUTINE W REFLEX MICROSCOPIC
BILIRUBIN URINE: NEGATIVE
Glucose, UA: NEGATIVE mg/dL
Hgb urine dipstick: NEGATIVE
KETONES UR: NEGATIVE mg/dL
NITRITE: NEGATIVE
PH: 7 (ref 5.0–8.0)
PROTEIN: 30 mg/dL — AB
Specific Gravity, Urine: 1.027 (ref 1.005–1.030)
UROBILINOGEN UA: 1 mg/dL (ref 0.0–1.0)

## 2014-11-21 LAB — POC URINE PREG, ED: PREG TEST UR: NEGATIVE

## 2014-11-21 LAB — POC OCCULT BLOOD, ED: Fecal Occult Bld: NEGATIVE

## 2014-11-21 LAB — URINE MICROSCOPIC-ADD ON

## 2014-11-21 LAB — HEMOGLOBIN A1C: Hgb A1c MFr Bld: 6.7 % — ABNORMAL HIGH (ref 4.6–6.5)

## 2014-11-21 MED ORDER — DIPHENHYDRAMINE HCL 50 MG/ML IJ SOLN
25.0000 mg | Freq: Once | INTRAMUSCULAR | Status: AC
  Administered 2014-11-21: 25 mg via INTRAVENOUS
  Filled 2014-11-21: qty 1

## 2014-11-21 MED ORDER — ONDANSETRON HCL 4 MG/2ML IJ SOLN
4.0000 mg | Freq: Once | INTRAMUSCULAR | Status: AC
  Administered 2014-11-21: 4 mg via INTRAVENOUS
  Filled 2014-11-21: qty 2

## 2014-11-21 MED ORDER — HYDROMORPHONE HCL 1 MG/ML IJ SOLN
1.0000 mg | Freq: Once | INTRAMUSCULAR | Status: AC
  Administered 2014-11-21: 1 mg via INTRAVENOUS
  Filled 2014-11-21: qty 1

## 2014-11-21 MED ORDER — SODIUM CHLORIDE 0.9 % IV BOLUS (SEPSIS)
1000.0000 mL | Freq: Once | INTRAVENOUS | Status: AC
  Administered 2014-11-21: 1000 mL via INTRAVENOUS

## 2014-11-21 MED ORDER — PROMETHAZINE HCL 25 MG/ML IJ SOLN
25.0000 mg | Freq: Once | INTRAMUSCULAR | Status: AC
  Administered 2014-11-21: 25 mg via INTRAVENOUS
  Filled 2014-11-21: qty 1

## 2014-11-21 MED ORDER — LORAZEPAM 2 MG/ML IJ SOLN
1.0000 mg | Freq: Once | INTRAMUSCULAR | Status: AC
  Administered 2014-11-21: 1 mg via INTRAVENOUS
  Filled 2014-11-21: qty 1

## 2014-11-21 NOTE — Patient Instructions (Signed)
blood tests are requested for you today.  We'll let you know about the results. check your blood sugar 2 times a day.  vary the time of day when you check, between before the 3 meals, and at bedtime.  also check if you have symptoms of your blood sugar being too high or too low.  please keep a record of the readings and bring it to your next appointment here.  please call us sooner if you are having low blood sugar episodes, or if it stays over 200.    Please come back for a follow-up appointment in 3 months.   The tanzeum and metformin could set off your stomach problems, so please stop them if you get nausea.

## 2014-11-21 NOTE — ED Notes (Addendum)
Pt st's she has lupus.  Pt st's she has had nausea and vomiting x's 1 1/2 weeks, unable to keep anything down.  Pt  C/o pain in her joints, st's unable to keep medicine down.  Also c/o blood in urine and stool.  Pt c/o chest tightness

## 2014-11-21 NOTE — Telephone Encounter (Signed)
Patient no showed today's appt. Please advise on how to follow up. °A. No follow up necessary. °B. Follow up urgent. Contact patient immediately. °C. Follow up necessary. Contact patient and schedule visit in ___ days. °D. Follow up advised. Contact patient and schedule visit in ____weeks. ° °

## 2014-11-21 NOTE — Progress Notes (Signed)
Subjective:    Patient ID: [name], female    DOB: Jan 29, 1971, 44 y.o.   MRN: [id number]  HPI Pt returns for f/u of diabetes mellitus: DM type: Insulin-requiring type 2 Dx'ed: 1683 Complications: polyneuropathy and gastroparesis.  Therapy: insulin (since 2014), tanzeum, and metformin.   GDM: never DKA: never Severe hypoglycemia: never Pancreatitis: never Other: she is too ill to undergo weight-loss surgery; she takes multiple daily injections of U-500; in early 2015, she was hospitalized with n/v, due to gastroparesis; later in 2015, Dr Criss Rosales added metformin and tanzeum.  Pt says she is not at risk for pregnancy.  she takes multiple daily injections Interval history: prednisone is still at 5 mg daily.  no cbg record, but states cbg's are well-controlled. She seldom has hypoglycemia, and these episodes are mild.  This usually happens in the middles of the night.  Past Medical History  Diagnosis Date  . Bell's palsy 08/09/2010  . SLE 08/09/2010  . SJOGREN'S SYNDROME 08/09/2010  . Hypertension   . High cholesterol   . Anginal pain   . Obstructive sleep apnea on CPAP 2011  . Anxiety   . GERD (gastroesophageal reflux disease)   . CHF (congestive heart failure)   . Acute renal failure      Intractable nausea vomiting secondary to diabetic gastroparesis causing dehydration and acute renal failure /notes 04/01/2013  . Liver mass     biopsied 03/2013 at [facility], not malignant.  is to undergo radiologic ablation of the mass in sept/October 2014.   Marland Kitchen Pneumonia 2000's    "once" (04/01/2013)  . On home oxygen therapy     "2L 24/7" (04/01/2013)  . Migraines     "maybe a couple times/yr" (04/01/2013)  . Stroke 2010; 10/2012    "left side is still weak from it, never fully regained full strength; no additions from stroke 10/2012"  . Obesity   . Family history of malignant neoplasm of breast   . Family history of malignant neoplasm of ovary   . Lupus   . Chest pain 01/13/2014  . Diabetic  gastroparesis associated with type 2 diabetes mellitus     this is presumed diagnoses, not confirmed by any studies.   . Shortness of breath     Past Surgical History  Procedure Laterality Date  . Ectopic pregnancy surgery  1999  . Cardiac catheterization  02/07/12  . Hernia repair  7290'S    umbilical  . Epidermoid cyst excision  2007     left breast  . Esophagogastroduodenoscopy N/A 02/26/2013    Procedure: ESOPHAGOGASTRODUODENOSCOPY (EGD);  Surgeon: Irene Shipper, MD;  Location: [facility] ENDOSCOPY;  Service: Endoscopy;  Laterality: N/A;  . Breast cyst excision Left 2007    epidermoid  . Left and right heart catheterization with coronary angiogram N/A 02/07/2012    Procedure: LEFT AND RIGHT HEART CATHETERIZATION WITH CORONARY ANGIOGRAM;  Surgeon: Laverda Page, MD;  Location: [facility] CATH LAB;  Service: Cardiovascular;  Laterality: N/A;    History   Social History  . Marital Status: Single    Spouse Name: N/A  . Number of Children: N/A  . Years of Education: N/A   Occupational History  .      student   Social History Main Topics  . Smoking status: Former Smoker -- 3 years    Types: Cigarettes    Quit date: 06/01/1990  . Smokeless tobacco: Never Used  . Alcohol Use: No  . Drug Use: No  . Sexual Activity:  Partners: Male    Museum/gallery curator: None   Other Topics Concern  . Not on file   Social History Narrative   Regular exercise-yes    Current Outpatient Prescriptions on File Prior to Visit  Medication Sig Dispense Refill  . Albiglutide (TANZEUM) 30 MG PEN Inject 30 mg into the skin once a week. Sundays    . albuterol (PROAIR HFA) 108 (90 BASE) MCG/ACT inhaler Inhale 2 puffs into the lungs every 6 (six) hours as needed for wheezing or shortness of breath.     Marland Kitchen albuterol (PROVENTIL) (5 MG/ML) 0.5% nebulizer solution Take 2.5 mg by nebulization every 6 (six) hours as needed for wheezing or shortness of breath.   5  . alendronate (FOSAMAX) 70 MG tablet Take 70 mg  by mouth every Saturday. Take with a full glass of water on an empty stomach.    Marland Kitchen aspirin EC 81 MG tablet Take 81 mg by mouth daily.    Marland Kitchen azaTHIOprine (IMURAN) 50 MG tablet Take 100 mg by mouth 2 (two) times daily.     . benzonatate (TESSALON) 100 MG capsule Take 1 capsule (100 mg total) by mouth 2 (two) times daily as needed for cough. 20 capsule 0  . carvedilol (COREG) 3.125 MG tablet Take 3.125 mg by mouth 2 (two) times daily with a meal.    . cetirizine (ZYRTEC) 10 MG tablet Take 10 mg by mouth daily.    . citalopram (CELEXA) 10 MG tablet Take 1 tablet (10 mg total) by mouth every evening. 30 tablet 0  . clopidogrel (PLAVIX) 75 MG tablet Take 75 mg by mouth daily.    . cycloSPORINE (RESTASIS) 0.05 % ophthalmic emulsion Place 1 drop into both eyes 3 (three) times daily.    . diclofenac sodium (VOLTAREN) 1 % GEL Apply 2 g topically 4 (four) times daily. Apply to back of left side of neck. 100 g 0  . famotidine (PEPCID) 20 MG tablet Take 20 mg by mouth 2 (two) times daily.    Marland Kitchen HUMULIN R 500 UNIT/ML SOLN injection INJECT 0.12 MLS (60 UNITS TOTAL) INTO THE SKIN 3 (THREE) TIMES DAILY WITH MEALS. 20 mL 0  . hydroxychloroquine (PLAQUENIL) 200 MG tablet Take 200 mg by mouth 2 (two) times daily.     . isosorbide-hydrALAZINE (BIDIL) 20-37.5 MG per tablet Take 1 tablet by mouth 3 (three) times daily.    Marland Kitchen losartan (COZAAR) 100 MG tablet Take 100 mg by mouth daily.  3  . medroxyPROGESTERone (PROVERA) 10 MG tablet Take 2 tablets (20 mg total) by mouth 3 (three) times daily. 42 tablet 0  . metFORMIN (GLUCOPHAGE-XR) 750 MG 24 hr tablet Take 750 mg by mouth 2 (two) times daily.  3  . morphine (MSIR) 15 MG tablet Take 15 mg by mouth every 4 (four) hours as needed for severe pain.    Marland Kitchen omeprazole (PRILOSEC) 40 MG capsule Take 1 capsule (40 mg total) by mouth daily. (Patient not taking: Reported on 10/15/2014) 30 capsule 1  . ondansetron (ZOFRAN-ODT) 4 MG disintegrating tablet Take 1 tablet (4 mg total) by mouth  every 8 (eight) hours as needed for nausea or vomiting. 20 tablet 0  . Polyvinyl Alcohol-Povidone (REFRESH OP) Place 1 drop into both eyes 2 (two) times daily.    . potassium chloride (MICRO-K) 10 MEQ CR capsule Take 10 mEq by mouth 2 (two) times daily.   3  . predniSONE (DELTASONE) 5 MG tablet Take 5 mg by mouth daily with breakfast.    .  pregabalin (LYRICA) 75 MG capsule Take 75 mg by mouth 3 (three) times daily.    Marland Kitchen torsemide (DEMADEX) 20 MG tablet Take 1 tablet (20 mg total) by mouth 2 (two) times daily. 30 tablet 0   No current facility-administered medications on file prior to visit.    Allergies  Allergen Reactions  . Metoclopramide Other (See Comments)    Developed restless leg, akathisia type limb movements.   . Oxycodone-Acetaminophen Hives    FYI: no reaction to hydrocodone; tolerates dilaudid  . Codeine Itching, Rash and Other (See Comments)    (Derm)    Family History  Problem Relation Age of Onset  . Diabetes Mother   . Hypertension Other   . Stroke Other   . Arthritis Other   . Ovarian cancer Sister 66    maternal half-sister; RAD51C positive  . Breast cancer Maternal Aunt 29    currently 62  . Stomach cancer Maternal Uncle     dx 65s; deceased  . Cancer Paternal Aunt     unk. primary; deceased 65s  . Prostate cancer Paternal Uncle     deceased 10s  . Breast cancer Cousin     deceased 53s; daughter of mat uncle with stomach ca  . Prostate cancer Maternal Uncle     deceased 35s  . Breast cancer Maternal Aunt     deceased 25s  . Cancer Maternal Aunt     unk. primary  . Cancer Maternal Aunt     unk. primary  . Ovarian cancer Maternal Aunt     deceased 30s    BP 134/68 mmHg  Pulse 103  Temp(Src) 98.4 F (36.9 C) (Oral)  Ht 5' 8"  (1.727 m)  Wt 401 lb (181.892 kg)  BMI 60.99 kg/m2  SpO2 97%  LMP 10/29/2014   Review of Systems Denies weight change and LOC    Objective:   Physical Exam VITAL SIGNS:  See vs page GENERAL: no distress.  Morbid  obesity Pulses: dorsalis pedis intact bilat.   MSK: no deformity of the feet CV: no leg edema Skin:  no ulcer on the feet.  normal color and temp on the feet. Neuro: sensation is intact to touch on the feet, but decreased from normal   Lab Results  Component Value Date   HGBA1C 6.7* 11/21/2014       Assessment & Plan:  DM: well-controlled Gastroparesis: stable despite tanzeum.  Patient is advised the following: Patient Instructions  blood tests are requested for you today.  We'll let you know about the results. check your blood sugar 2 times a day.  vary the time of day when you check, between before the 3 meals, and at bedtime.  also check if you have symptoms of your blood sugar being too high or too low.  please keep a record of the readings and bring it to your next appointment here.  please call us sooner if you are having low blood sugar episodes, or if it stays over 200.    Please come back for a follow-up appointment in 3 months.   The tanzeum and metformin could set off your stomach problems, so please stop them if you get nausea.

## 2014-11-21 NOTE — ED Notes (Signed)
Gave PT some water, able to swallow ok but then began to vomit. Sick bag supplied. RN Notified.

## 2014-11-21 NOTE — ED Provider Notes (Signed)
CSN: [id number]     Arrival date & time 11/21/14  1753 History   First MD Initiated Contact with Patient 11/21/14 2023     Chief Complaint  Patient presents with  . Emesis     (Consider location/radiation/quality/duration/timing/severity/associated sxs/prior Treatment) HPI Comments: Patient with past medical history of lupus, Sjogren's syndrome, presents emergency department with chief complaint of generalized arthralgias, chest pain, cough, nausea, vomiting, and abdominal pain. Patient states that she has had intractable vomiting for the past week and a half. States that she has been unable to keep down her medications. There are no aggravating or alleviating factors. Patient also reports some hematuria and bright red blood per rectum.  The history is provided by the patient. No language interpreter was used.    Past Medical History  Diagnosis Date  . Bell's palsy 08/09/2010  . SLE 08/09/2010  . SJOGREN'S SYNDROME 08/09/2010  . Hypertension   . High cholesterol   . Anginal pain   . Obstructive sleep apnea on CPAP 2011  . Anxiety   . GERD (gastroesophageal reflux disease)   . CHF (congestive heart failure)   . Acute renal failure      Intractable nausea vomiting secondary to diabetic gastroparesis causing dehydration and acute renal failure /notes 04/01/2013  . Liver mass     biopsied 03/2013 at [facility], not malignant.  is to undergo radiologic ablation of the mass in sept/October 2014.   Marland Kitchen Pneumonia 2000's    "once" (04/01/2013)  . On home oxygen therapy     "2L 24/7" (04/01/2013)  . Migraines     "maybe a couple times/yr" (04/01/2013)  . Stroke 2010; 10/2012    "left side is still weak from it, never fully regained full strength; no additions from stroke 10/2012"  . Obesity   . Family history of malignant neoplasm of breast   . Family history of malignant neoplasm of ovary   . Lupus   . Chest pain 01/13/2014  . Diabetic gastroparesis associated with type 2 diabetes mellitus      this is presumed diagnoses, not confirmed by any studies.   . Shortness of breath    Past Surgical History  Procedure Laterality Date  . Ectopic pregnancy surgery  1999  . Cardiac catheterization  02/07/12  . Hernia repair  [address]    umbilical  . Epidermoid cyst excision  2007     left breast  . Esophagogastroduodenoscopy N/A 02/26/2013    Procedure: ESOPHAGOGASTRODUODENOSCOPY (EGD);  Surgeon: Irene Shipper, MD;  Location: [facility] ENDOSCOPY;  Service: Endoscopy;  Laterality: N/A;  . Breast cyst excision Left 2007    epidermoid  . Left and right heart catheterization with coronary angiogram N/A 02/07/2012    Procedure: LEFT AND RIGHT HEART CATHETERIZATION WITH CORONARY ANGIOGRAM;  Surgeon: Laverda Page, MD;  Location: [facility] CATH LAB;  Service: Cardiovascular;  Laterality: N/A;   Family History  Problem Relation Age of Onset  . Diabetes Mother   . Hypertension Other   . Stroke Other   . Arthritis Other   . Ovarian cancer Sister 22    maternal half-sister; RAD51C positive  . Breast cancer Maternal Aunt 71    currently 66  . Stomach cancer Maternal Uncle     dx 23s; deceased  . Cancer Paternal Aunt     unk. primary; deceased 62s  . Prostate cancer Paternal Uncle     deceased 57s  . Breast cancer Cousin     deceased 15s; daughter of mat  uncle with stomach ca  . Prostate cancer Maternal Uncle     deceased 52s  . Breast cancer Maternal Aunt     deceased 58s  . Cancer Maternal Aunt     unk. primary  . Cancer Maternal Aunt     unk. primary  . Ovarian cancer Maternal Aunt     deceased 23s   History  Substance Use Topics  . Smoking status: Former Smoker -- 3 years    Types: Cigarettes    Quit date: 06/01/1990  . Smokeless tobacco: Never Used  . Alcohol Use: No   OB History    Gravida Para Term Preterm AB TAB SAB Ectopic Multiple Living   _0 Review of Systems  Constitutional: Negative for fever and chills.  Respiratory: Negative for shortness of breath.    Cardiovascular: Negative for chest pain.  Gastrointestinal: Positive for nausea and vomiting. Negative for diarrhea and constipation.  Genitourinary: Negative for dysuria.  All other systems reviewed and are negative.     Allergies  Metoclopramide; Oxycodone-acetaminophen; and Codeine  Home Medications   Prior to Admission medications   Medication Sig Start Date End Date Taking? Authorizing Provider  Albiglutide (TANZEUM) 30 MG PEN Inject 30 mg into the skin once a week. Sundays    Historical Provider, MD  albuterol (PROAIR HFA) 108 (90 BASE) MCG/ACT inhaler Inhale 2 puffs into the lungs every 6 (six) hours as needed for wheezing or shortness of breath.     Historical Provider, MD  albuterol (PROVENTIL) (5 MG/ML) 0.5% nebulizer solution Take 2.5 mg by nebulization every 6 (six) hours as needed for wheezing or shortness of breath.  06/24/14   Historical Provider, MD  alendronate (FOSAMAX) 70 MG tablet Take 70 mg by mouth every Saturday. Take with a full glass of water on an empty stomach.    Historical Provider, MD  ALPRAZolam Duanne Moron) 0.5 MG tablet Take 0.5 mg by mouth 2 (two) times daily as needed for sleep or anxiety.     Historical Provider, MD  aspirin EC 81 MG tablet Take 81 mg by mouth daily.    Historical Provider, MD  azaTHIOprine (IMURAN) 50 MG tablet Take 100 mg by mouth 2 (two) times daily.     Historical Provider, MD  benzonatate (TESSALON) 100 MG capsule Take 1 capsule (100 mg total) by mouth 2 (two) times daily as needed for cough. 10/15/14   Larence Penning, MD  carvedilol (COREG) 3.125 MG tablet Take 3.125 mg by mouth 2 (two) times daily with a meal.    Historical Provider, MD  cetirizine (ZYRTEC) 10 MG tablet Take 10 mg by mouth daily.    Historical Provider, MD  citalopram (CELEXA) 10 MG tablet Take 1 tablet (10 mg total) by mouth every evening. 04/06/13   Belkys A Regalado, MD  clopidogrel (PLAVIX) 75 MG tablet Take 75 mg by mouth daily.    Historical Provider, MD   cyclobenzaprine (FLEXERIL) 10 MG tablet Take 10 mg by mouth 2 (two) times daily as needed for muscle spasms.  07/30/14   Historical Provider, MD  cycloSPORINE (RESTASIS) 0.05 % ophthalmic emulsion Place 1 drop into both eyes 3 (three) times daily.    Historical Provider, MD  diclofenac sodium (VOLTAREN) 1 % GEL Apply 2 g topically 4 (four) times daily. Apply to back of left side of neck. 07/29/14   Modena Jansky, MD  etonogestrel-ethinyl estradiol (NUVARING) 0.12-0.015 MG/24HR vaginal ring Place  1 each vaginally every 28 (twenty-eight) days. Insert vaginally and leave in place for 3 consecutive weeks, then remove for 1 week. (last inserted 10/01/14)    Historical Provider, MD  famotidine (PEPCID) 20 MG tablet Take 20 mg by mouth 2 (two) times daily.    Historical Provider, MD  HUMULIN R 500 UNIT/ML SOLN injection INJECT 0.12 MLS (60 UNITS TOTAL) INTO THE SKIN 3 (THREE) TIMES DAILY WITH MEALS. 08/06/14   Renato Shin, MD  hydroxychloroquine (PLAQUENIL) 200 MG tablet Take 200 mg by mouth 2 (two) times daily.     Historical Provider, MD  isosorbide-hydrALAZINE (BIDIL) 20-37.5 MG per tablet Take 1 tablet by mouth 3 (three) times daily.    Historical Provider, MD  losartan (COZAAR) 100 MG tablet Take 100 mg by mouth daily. 06/18/14   Historical Provider, MD  medroxyPROGESTERone (PROVERA) 10 MG tablet Take 2 tablets (20 mg total) by mouth 3 (three) times daily. 07/31/14   Lahoma Crocker, MD  metFORMIN (GLUCOPHAGE-XR) 750 MG 24 hr tablet Take 750 mg by mouth 2 (two) times daily. 07/07/14   Historical Provider, MD  morphine (MSIR) 15 MG tablet Take 15 mg by mouth every 4 (four) hours as needed for severe pain.    Historical Provider, MD  Naloxone HCl 0.4 MG/0.4ML SOAJ Inject 0.4 mg as directed as needed (overdose). Evzio    Historical Provider, MD  omeprazole (PRILOSEC) 40 MG capsule Take 1 capsule (40 mg total) by mouth daily. Patient not taking: Reported on 10/15/2014 01/15/14   Barton Dubois, MD   ondansetron (ZOFRAN-ODT) 4 MG disintegrating tablet Take 1 tablet (4 mg total) by mouth every 8 (eight) hours as needed for nausea or vomiting. 10/15/14   Larence Penning, MD  Polyvinyl Alcohol-Povidone (REFRESH OP) Place 1 drop into both eyes 2 (two) times daily.    Historical Provider, MD  potassium chloride (MICRO-K) 10 MEQ CR capsule Take 10 mEq by mouth 2 (two) times daily.  08/21/14   Historical Provider, MD  predniSONE (DELTASONE) 5 MG tablet Take 5 mg by mouth daily with breakfast.    Historical Provider, MD  pregabalin (LYRICA) 75 MG capsule Take 75 mg by mouth 3 (three) times daily.    Historical Provider, MD  sucralfate (CARAFATE) 1 GM/10ML suspension Take 1 g by mouth 4 (four) times daily -  with meals and at bedtime.    Historical Provider, MD  torsemide (DEMADEX) 20 MG tablet Take 1 tablet (20 mg total) by mouth 2 (two) times daily. 04/06/13   Belkys A Regalado, MD   BP 154/98 mmHg  Pulse 92  Temp(Src) 98.5 F (36.9 C) (Oral)  Resp 18  Ht $R'5\' 8"'Qn$  (1.727 m)  Wt 401 lb 3 oz (181.977 kg)  BMI 61.01 kg/m2  SpO2 97%  LMP 10/29/2014 Physical Exam  Constitutional: She is oriented to person, place, and time. She appears well-developed and well-nourished.  HENT:  Head: Normocephalic and atraumatic.  Eyes: Conjunctivae and EOM are normal. Pupils are equal, round, and reactive to light.  Neck: Normal range of motion. Neck supple.  Cardiovascular: Normal rate and regular rhythm.  Exam reveals no gallop and no friction rub.   No murmur heard. Pulmonary/Chest: Effort normal and breath sounds normal. No respiratory distress. She has no wheezes. She has no rales. She exhibits no tenderness.  Abdominal: Soft. Bowel sounds are normal. She exhibits no distension and no mass. There is no tenderness. There is no rebound and no guarding.  No focal abdominal tenderness, no RLQ tenderness or  pain at McBurney's point, no RUQ tenderness or Murphy's sign, no left-sided abdominal tenderness, no fluid wave, or  signs of peritonitis   Musculoskeletal: Normal range of motion. She exhibits no edema or tenderness.  Neurological: She is alert and oriented to person, place, and time.  Skin: Skin is warm and dry.  Psychiatric: She has a normal mood and affect. Her behavior is normal. Judgment and thought content normal.  Nursing note and vitals reviewed.   ED Course  Procedures (including critical care time) Results for orders placed or performed during the hospital encounter of 11/21/14  CBC with Differential  Result Value Ref Range   WBC 10.5 4.0 - 10.5 K/uL   RBC 3.69 (L) 3.87 - 5.11 MIL/uL   Hemoglobin 9.5 (L) 12.0 - 15.0 g/dL   HCT 31.1 (L) 36.0 - 46.0 %   MCV 84.3 78.0 - 100.0 fL   MCH 25.7 (L) 26.0 - 34.0 pg   MCHC 30.5 30.0 - 36.0 g/dL   RDW 15.9 (H) 11.5 - 15.5 %   Platelets 327 150 - 400 K/uL   Neutrophils Relative % 71 43 - 77 %   Neutro Abs 7.5 1.7 - 7.7 K/uL   Lymphocytes Relative 21 12 - 46 %   Lymphs Abs 2.2 0.7 - 4.0 K/uL   Monocytes Relative 7 3 - 12 %   Monocytes Absolute 0.7 0.1 - 1.0 K/uL   Eosinophils Relative 1 0 - 5 %   Eosinophils Absolute 0.1 0.0 - 0.7 K/uL   Basophils Relative 0 0 - 1 %   Basophils Absolute 0.0 0.0 - 0.1 K/uL  Urinalysis, Routine w reflex microscopic  Result Value Ref Range   Color, Urine AMBER (A) YELLOW   APPearance CLEAR CLEAR   Specific Gravity, Urine 1.027 1.005 - 1.030   pH 7.0 5.0 - 8.0   Glucose, UA NEGATIVE NEGATIVE mg/dL   Hgb urine dipstick NEGATIVE NEGATIVE   Bilirubin Urine NEGATIVE NEGATIVE   Ketones, ur NEGATIVE NEGATIVE mg/dL   Protein, ur 30 (A) NEGATIVE mg/dL   Urobilinogen, UA 1.0 0.0 - 1.0 mg/dL   Nitrite NEGATIVE NEGATIVE   Leukocytes, UA TRACE (A) NEGATIVE  Comprehensive metabolic panel  Result Value Ref Range   Sodium 139 135 - 145 mmol/L   Potassium 4.3 3.5 - 5.1 mmol/L   Chloride 107 96 - 112 mmol/L   CO2 23 19 - 32 mmol/L   Glucose, Bld 113 (H) 70 - 99 mg/dL   BUN 10 6 - 23 mg/dL   Creatinine, Ser 0.82 0.50  - 1.10 mg/dL   Calcium 9.0 8.4 - 10.5 mg/dL   Total Protein 6.9 6.0 - 8.3 g/dL   Albumin 2.9 (L) 3.5 - 5.2 g/dL   AST 16 0 - 37 U/L   ALT 11 0 - 35 U/L   Alkaline Phosphatase 69 39 - 117 U/L   Total Bilirubin 0.3 0.3 - 1.2 mg/dL   GFR calc non Af Amer 86 (L) >90 mL/min   GFR calc Af Amer >90 >90 mL/min   Anion gap 9 5 - 15  Urine microscopic-add on  Result Value Ref Range   Squamous Epithelial / LPF FEW (A) RARE   WBC, UA 7-10 <3 WBC/hpf   RBC / HPF 0-2 <3 RBC/hpf   Bacteria, UA FEW (A) RARE   Urine-Other MUCOUS PRESENT   I-Stat Chem 8, ED  Result Value Ref Range   Sodium 142 135 - 145 mmol/L   Potassium 4.2 3.5 - 5.1  mmol/L   Chloride 107 96 - 112 mmol/L   BUN 12 6 - 23 mg/dL   Creatinine, Ser 0.80 0.50 - 1.10 mg/dL   Glucose, Bld 132 (H) 70 - 99 mg/dL   Calcium, Ion 1.18 1.12 - 1.23 mmol/L   TCO2 21 0 - 100 mmol/L   Hemoglobin 12.2 12.0 - 15.0 g/dL   HCT 36.0 36.0 - 46.0 %  I-Stat Troponin, ED (not at [facility])  Result Value Ref Range   Troponin i, poc 0.00 0.00 - 0.08 ng/mL   Comment 3          POC urine preg, ED (not at [facility])  Result Value Ref Range   Preg Test, Ur NEGATIVE NEGATIVE  POC occult blood, ED  Result Value Ref Range   Fecal Occult Bld NEGATIVE NEGATIVE   Dg Chest 2 View  11/21/2014   CLINICAL DATA:  Vomiting  EXAM: CHEST  2 VIEW  COMPARISON:  10/15/2014  FINDINGS: Cardiac enlargement with vascular congestion similar to the prior study. Negative for edema or effusion. Negative for pneumonia.  IMPRESSION: Cardiac enlargement with vascular congestion.  Negative for edema.   Electronically Signed   By: Franchot Gallo M.D.   On: 11/21/2014 19:59     Imaging Review Dg Chest 2 View  11/21/2014   CLINICAL DATA:  Vomiting  EXAM: CHEST  2 VIEW  COMPARISON:  10/15/2014  FINDINGS: Cardiac enlargement with vascular congestion similar to the prior study. Negative for edema or effusion. Negative for pneumonia.  IMPRESSION: Cardiac enlargement with vascular congestion.   Negative for edema.   Electronically Signed   By: Franchot Gallo M.D.   On: 11/21/2014 19:59     EKG Interpretation None      MDM   Final diagnoses:  Nausea and vomiting, vomiting of unspecified type    Patient with persistent nausea and vomiting for the past week and a half or so. States that she has been unable to keep down her regular medications. We'll check basic labs, give fluids, treat pain. Will reassess.  Labs are reassuring. No evidence of kidney injury.  12:12 AM Patient reassessed, she has been able to tolerate drinking some ginger ale. She has had better success after the Phenergan and Ativan. Will plan for discharge to home. Patient's vital signs, and laboratory workup are reassuring. Patient does not have any focal abdominal tenderness. Patient understands and agrees with the plan.   Montine Circle, PA-C 11/22/14 0037  Tanna Furry, MD 12/02/14 [phone]

## 2014-11-22 MED ORDER — LORAZEPAM 1 MG PO TABS: 1.0000 mg | ORAL_TABLET | Freq: Four times a day (QID) | ORAL | Status: DC | PRN

## 2014-11-22 MED ORDER — PROMETHAZINE HCL 25 MG PO TABS: 25.0000 mg | ORAL_TABLET | Freq: Four times a day (QID) | ORAL | Status: DC | PRN

## 2014-11-22 NOTE — Discharge Instructions (Signed)

## 2014-11-24 ENCOUNTER — Emergency Department (HOSPITAL_COMMUNITY): Payer: Medicaid Other

## 2014-11-24 ENCOUNTER — Encounter (HOSPITAL_COMMUNITY): Payer: Self-pay | Admitting: *Deleted

## 2014-11-24 ENCOUNTER — Emergency Department (HOSPITAL_COMMUNITY)
Admission: EM | Admit: 2014-11-24 | Discharge: 2014-11-24 | Disposition: A | Discharge status: Home / Self Care | Payer: Medicaid Other | Attending: Emergency Medicine | Admitting: Emergency Medicine

## 2014-11-24 DIAGNOSIS — K219 Gastro-esophageal reflux disease without esophagitis: Secondary | ICD-10-CM | POA: Insufficient documentation

## 2014-11-24 DIAGNOSIS — Y9301 Activity, walking, marching and hiking: Secondary | ICD-10-CM | POA: Insufficient documentation

## 2014-11-24 DIAGNOSIS — Z8673 Personal history of transient ischemic attack (TIA), and cerebral infarction without residual deficits: Secondary | ICD-10-CM | POA: Insufficient documentation

## 2014-11-24 DIAGNOSIS — Z9981 Dependence on supplemental oxygen: Secondary | ICD-10-CM | POA: Insufficient documentation

## 2014-11-24 DIAGNOSIS — Y998 Other external cause status: Secondary | ICD-10-CM | POA: Insufficient documentation

## 2014-11-24 DIAGNOSIS — Y929 Unspecified place or not applicable: Secondary | ICD-10-CM | POA: Insufficient documentation

## 2014-11-24 DIAGNOSIS — I509 Heart failure, unspecified: Secondary | ICD-10-CM | POA: Diagnosis not present

## 2014-11-24 DIAGNOSIS — G4733 Obstructive sleep apnea (adult) (pediatric): Secondary | ICD-10-CM | POA: Insufficient documentation

## 2014-11-24 DIAGNOSIS — E1143 Type 2 diabetes mellitus with diabetic autonomic (poly)neuropathy: Secondary | ICD-10-CM | POA: Insufficient documentation

## 2014-11-24 DIAGNOSIS — I10 Essential (primary) hypertension: Secondary | ICD-10-CM | POA: Diagnosis not present

## 2014-11-24 DIAGNOSIS — I209 Angina pectoris, unspecified: Secondary | ICD-10-CM | POA: Insufficient documentation

## 2014-11-24 DIAGNOSIS — Z8701 Personal history of pneumonia (recurrent): Secondary | ICD-10-CM | POA: Diagnosis not present

## 2014-11-24 DIAGNOSIS — F419 Anxiety disorder, unspecified: Secondary | ICD-10-CM | POA: Insufficient documentation

## 2014-11-24 DIAGNOSIS — E669 Obesity, unspecified: Secondary | ICD-10-CM | POA: Insufficient documentation

## 2014-11-24 DIAGNOSIS — Z794 Long term (current) use of insulin: Secondary | ICD-10-CM | POA: Diagnosis not present

## 2014-11-24 DIAGNOSIS — Z8739 Personal history of other diseases of the musculoskeletal system and connective tissue: Secondary | ICD-10-CM | POA: Insufficient documentation

## 2014-11-24 DIAGNOSIS — W228XXA Striking against or struck by other objects, initial encounter: Secondary | ICD-10-CM | POA: Diagnosis not present

## 2014-11-24 DIAGNOSIS — M79675 Pain in left toe(s): Secondary | ICD-10-CM

## 2014-11-24 DIAGNOSIS — Z79899 Other long term (current) drug therapy: Secondary | ICD-10-CM | POA: Insufficient documentation

## 2014-11-24 DIAGNOSIS — K3184 Gastroparesis: Secondary | ICD-10-CM | POA: Insufficient documentation

## 2014-11-24 DIAGNOSIS — S99922A Unspecified injury of left foot, initial encounter: Secondary | ICD-10-CM | POA: Diagnosis present

## 2014-11-24 DIAGNOSIS — Z7902 Long term (current) use of antithrombotics/antiplatelets: Secondary | ICD-10-CM | POA: Insufficient documentation

## 2014-11-24 DIAGNOSIS — Z87891 Personal history of nicotine dependence: Secondary | ICD-10-CM | POA: Diagnosis not present

## 2014-11-24 DIAGNOSIS — Z7982 Long term (current) use of aspirin: Secondary | ICD-10-CM | POA: Diagnosis not present

## 2014-11-24 DIAGNOSIS — Z7952 Long term (current) use of systemic steroids: Secondary | ICD-10-CM | POA: Insufficient documentation

## 2014-11-24 DIAGNOSIS — Z87448 Personal history of other diseases of urinary system: Secondary | ICD-10-CM | POA: Insufficient documentation

## 2014-11-24 NOTE — ED Notes (Signed)
Pt states that she stubbed her toe last night. C/o pain to left pinky toe.

## 2014-11-24 NOTE — ED Provider Notes (Signed)
CSN: [id number]     Arrival date & time 11/24/14  1959 History  This chart was scribed for non-physician practitioner, Quincy Carnes, PA-C working with Daleen Bo, MD, by Erling Conte, ED Scribe. This patient was seen in room TR01C/TR01C and the patient's care was started at 8:25 PM.    Chief Complaint  Patient presents with  . Foot Injury    The history is provided by the patient. No language interpreter was used.    HPI Comments: [name] is a 44 y.o. female with a h/o bell's palsy, HTN, high cholesterol, OSA, anxiety, GERD, CHF, acute renal failure, obesity and lupus who presents to the Emergency Department complaining of a left foot injury that occurred 1 day ago. She states that she was on medication that she was prescribed from her ED visit 3 days ago and she was feeling a little "out of it" and she was unable to see due to it being dark and stubbed her toe. She is complaining of "10/10" pain to her left pink pinky toe. She has not taking anything for the pain. Pt states the pain is exacerbated with bearing weight or movement. She denies any h/o broken bones in her left foot. She denies any swelling or numbness.  Past Medical History  Diagnosis Date  . Bell's palsy 08/09/2010  . SLE 08/09/2010  . SJOGREN'S SYNDROME 08/09/2010  . Hypertension   . High cholesterol   . Anginal pain   . Obstructive sleep apnea on CPAP 2011  . Anxiety   . GERD (gastroesophageal reflux disease)   . CHF (congestive heart failure)   . Acute renal failure      Intractable nausea vomiting secondary to diabetic gastroparesis causing dehydration and acute renal failure /notes 04/01/2013  . Liver mass     biopsied 03/2013 at [facility], not malignant.  is to undergo radiologic ablation of the mass in sept/October 2014.   Marland Kitchen Pneumonia 2000's    "once" (04/01/2013)  . On home oxygen therapy     "2L 24/7" (04/01/2013)  . Migraines     "maybe a couple times/yr" (04/01/2013)  . Stroke 2010; 10/2012    "left side  is still weak from it, never fully regained full strength; no additions from stroke 10/2012"  . Obesity   . Family history of malignant neoplasm of breast   . Family history of malignant neoplasm of ovary   . Lupus   . Chest pain 01/13/2014  . Diabetic gastroparesis associated with type 2 diabetes mellitus     this is presumed diagnoses, not confirmed by any studies.   . Shortness of breath    Past Surgical History  Procedure Laterality Date  . Ectopic pregnancy surgery  1999  . Cardiac catheterization  02/07/12  . Hernia repair  2536'U    umbilical  . Epidermoid cyst excision  2007     left breast  . Esophagogastroduodenoscopy N/A 02/26/2013    Procedure: ESOPHAGOGASTRODUODENOSCOPY (EGD);  Surgeon: Irene Shipper, MD;  Location: [facility] ENDOSCOPY;  Service: Endoscopy;  Laterality: N/A;  . Breast cyst excision Left 2007    epidermoid  . Left and right heart catheterization with coronary angiogram N/A 02/07/2012    Procedure: LEFT AND RIGHT HEART CATHETERIZATION WITH CORONARY ANGIOGRAM;  Surgeon: Laverda Page, MD;  Location: [facility] CATH LAB;  Service: Cardiovascular;  Laterality: N/A;   Family History  Problem Relation Age of Onset  . Diabetes Mother   . Hypertension Other   . Stroke Other   .  Arthritis Other   . Ovarian cancer Sister 19    maternal half-sister; RAD51C positive  . Breast cancer Maternal Aunt 23    currently 71  . Stomach cancer Maternal Uncle     dx 13s; deceased  . Cancer Paternal Aunt     unk. primary; deceased 40s  . Prostate cancer Paternal Uncle     deceased 28s  . Breast cancer Cousin     deceased 22s; daughter of mat uncle with stomach ca  . Prostate cancer Maternal Uncle     deceased 71s  . Breast cancer Maternal Aunt     deceased 45s  . Cancer Maternal Aunt     unk. primary  . Cancer Maternal Aunt     unk. primary  . Ovarian cancer Maternal Aunt     deceased 85s   History  Substance Use Topics  . Smoking status: Former Smoker -- 3 years    Types:  Cigarettes    Quit date: 06/01/1990  . Smokeless tobacco: Never Used  . Alcohol Use: No   OB History    Gravida Para Term Preterm AB TAB SAB Ectopic Multiple Living   2 1 1  1   1  1      Review of Systems  Musculoskeletal: Positive for arthralgias. Negative for joint swelling.  Neurological: Negative for weakness and numbness.  All other systems reviewed and are negative.     Allergies  Metoclopramide; Oxycodone-acetaminophen; and Codeine  Home Medications   Prior to Admission medications   Medication Sig Start Date End Date Taking? Authorizing Provider  Albiglutide (TANZEUM) 30 MG PEN Inject 30 mg into the skin once a week. Sundays    Historical Provider, MD  albuterol (PROAIR HFA) 108 (90 BASE) MCG/ACT inhaler Inhale 2 puffs into the lungs every 6 (six) hours as needed for wheezing or shortness of breath.     Historical Provider, MD  albuterol (PROVENTIL) (5 MG/ML) 0.5% nebulizer solution Take 2.5 mg by nebulization every 6 (six) hours as needed for wheezing or shortness of breath.  06/24/14   Historical Provider, MD  alendronate (FOSAMAX) 70 MG tablet Take 70 mg by mouth every Saturday. Take with a full glass of water on an empty stomach.    Historical Provider, MD  aspirin EC 81 MG tablet Take 81 mg by mouth daily.    Historical Provider, MD  azaTHIOprine (IMURAN) 50 MG tablet Take 100 mg by mouth 2 (two) times daily.     Historical Provider, MD  benzonatate (TESSALON) 100 MG capsule Take 1 capsule (100 mg total) by mouth 2 (two) times daily as needed for cough. 10/15/14   Larence Penning, MD  carvedilol (COREG) 3.125 MG tablet Take 3.125 mg by mouth 2 (two) times daily with a meal.    Historical Provider, MD  cetirizine (ZYRTEC) 10 MG tablet Take 10 mg by mouth daily.    Historical Provider, MD  citalopram (CELEXA) 10 MG tablet Take 1 tablet (10 mg total) by mouth every evening. 04/06/13   Belkys A Regalado, MD  clopidogrel (PLAVIX) 75 MG tablet Take 75 mg by mouth daily.     Historical Provider, MD  cycloSPORINE (RESTASIS) 0.05 % ophthalmic emulsion Place 1 drop into both eyes 3 (three) times daily.    Historical Provider, MD  diclofenac sodium (VOLTAREN) 1 % GEL Apply 2 g topically 4 (four) times daily. Apply to back of left side of neck. 07/29/14   Modena Jansky, MD  famotidine (PEPCID) 20 MG tablet  Take 20 mg by mouth 2 (two) times daily.    Historical Provider, MD  HUMULIN R 500 UNIT/ML SOLN injection INJECT 0.12 MLS (60 UNITS TOTAL) INTO THE SKIN 3 (THREE) TIMES DAILY WITH MEALS. 08/06/14   Renato Shin, MD  hydroxychloroquine (PLAQUENIL) 200 MG tablet Take 200 mg by mouth 2 (two) times daily.     Historical Provider, MD  isosorbide-hydrALAZINE (BIDIL) 20-37.5 MG per tablet Take 1 tablet by mouth 3 (three) times daily.    Historical Provider, MD  LORazepam (ATIVAN) 1 MG tablet Take 1 tablet (1 mg total) by mouth every 6 (six) hours as needed for anxiety. 11/22/14   Montine Circle, PA-C  losartan (COZAAR) 100 MG tablet Take 100 mg by mouth daily. 06/18/14   Historical Provider, MD  medroxyPROGESTERone (PROVERA) 10 MG tablet Take 2 tablets (20 mg total) by mouth 3 (three) times daily. 07/31/14   Lahoma Crocker, MD  metFORMIN (GLUCOPHAGE-XR) 750 MG 24 hr tablet Take 750 mg by mouth 2 (two) times daily. 07/07/14   Historical Provider, MD  morphine (MSIR) 15 MG tablet Take 15 mg by mouth every 4 (four) hours as needed for severe pain.    Historical Provider, MD  omeprazole (PRILOSEC) 40 MG capsule Take 1 capsule (40 mg total) by mouth daily. Patient not taking: Reported on 10/15/2014 01/15/14   Barton Dubois, MD  ondansetron (ZOFRAN-ODT) 4 MG disintegrating tablet Take 1 tablet (4 mg total) by mouth every 8 (eight) hours as needed for nausea or vomiting. 10/15/14   Larence Penning, MD  Polyvinyl Alcohol-Povidone (REFRESH OP) Place 1 drop into both eyes 2 (two) times daily.    Historical Provider, MD  potassium chloride (MICRO-K) 10 MEQ CR capsule Take 10 mEq by mouth 2  (two) times daily.  08/21/14   Historical Provider, MD  predniSONE (DELTASONE) 5 MG tablet Take 5 mg by mouth daily with breakfast.    Historical Provider, MD  pregabalin (LYRICA) 75 MG capsule Take 75 mg by mouth 3 (three) times daily.    Historical Provider, MD  promethazine (PHENERGAN) 25 MG tablet Take 1 tablet (25 mg total) by mouth every 6 (six) hours as needed for nausea or vomiting. 11/22/14   Montine Circle, PA-C  torsemide (DEMADEX) 20 MG tablet Take 1 tablet (20 mg total) by mouth 2 (two) times daily. 04/06/13   Belkys A Regalado, MD   Triage Vitals: BP 151/73 mmHg  Pulse 90  Temp(Src) 99.2 F (37.3 C) (Oral)  Resp 16  SpO2 100%  LMP 10/29/2014  Physical Exam  Constitutional: She is oriented to person, place, and time. She appears well-developed and well-nourished.  HENT:  Head: Normocephalic and atraumatic.  Mouth/Throat: Oropharynx is clear and moist.  Eyes: Conjunctivae and EOM are normal. Pupils are equal, round, and reactive to light.  Neck: Normal range of motion.  Cardiovascular: Normal rate, regular rhythm and normal heart sounds.   Pulmonary/Chest: Effort normal and breath sounds normal. No respiratory distress. She has no wheezes.  Musculoskeletal: Normal range of motion.       Left foot: There is tenderness and bony tenderness. There is no swelling and no deformity.       Feet:  Left 5th toe tender to palpation with some bruising noted; no bony deformity; foot is NVI  Neurological: She is alert and oriented to person, place, and time.  Skin: Skin is warm and dry.  Psychiatric: She has a normal mood and affect.  Nursing note and vitals reviewed.   ED Course  ORTHOPEDIC INJURY TREATMENT Date/Time: 11/24/2014 10:12 PM Performed by: Larene Pickett Authorized by: Larene Pickett Consent: Verbal consent obtained. Risks and benefits: risks, benefits and alternatives were discussed Consent given by: patient Patient understanding: patient states understanding of  the procedure being performed Required items: required blood products, implants, devices, and special equipment available Patient identity confirmed: verbally with patient Injury location: foot Location details: left foot Injury type: soft tissue Pre-procedure neurovascular assessment: neurovascularly intact Immobilization: brace Supplies used: elastic bandage Post-procedure neurovascular assessment: post-procedure neurovascularly intact Patient tolerance: Patient tolerated the procedure well with no immediate complications   (including critical care time)  DIAGNOSTIC STUDIES: Oxygen Saturation is 100% on 2 LPM, normal by my interpretation.    COORDINATION OF CARE: 8:29 PM- Will order x-ray of left foot.  Pt advised of plan for treatment and pt agrees.   Labs Review Labs Reviewed - No data to display  Imaging Review Dg Foot Complete Left  11/24/2014   CLINICAL DATA:  Left foot pain across the metatarsals for 1 day, after stubbing fifth toe and falling. Initial encounter.  EXAM: LEFT FOOT - COMPLETE 3+ VIEW  COMPARISON:  None.  FINDINGS: There is no evidence of fracture or dislocation. The joint spaces are preserved. There is no evidence of talar subluxation; the subtalar joint is unremarkable in appearance. A tiny plantar calcaneal spur is noted.  No significant soft tissue abnormalities are seen.  IMPRESSION: No evidence of fracture or dislocation.   Electronically Signed   By: Garald Balding M.D.   On: 11/24/2014 21:17     EKG Interpretation None      MDM   Final diagnoses:  Toe pain, left   44 year old female with left fifth toe injury last night after stubbing it on the wall while walking around in the dark. States constant, throbbing pain of left fifth toe with radiation into foot. There is some bruising noted on exam but no gross bony deformity. X-ray obtained which is negative for acute fracture or dislocation. Ace wrap applied for patient comfort. Discuss supportive  care at home including NSAIDs and RICE routine.  FU with PCP.  Discussed plan with patient, he/she acknowledged understanding and agreed with plan of care.  Return precautions given for new or worsening symptoms.  I personally performed the services described in this documentation, which was scribed in my presence. The recorded information has been reviewed and is accurate.  Larene Pickett, PA-C 11/24/14 [place], PA-C 11/24/14 [id number]  Daleen Bo, MD 11/27/14 [phone]

## 2014-11-24 NOTE — Discharge Instructions (Signed)
May take tylenol or motrin as needed for pain. May wish to ice and elevate foot at home to help with pain/swelling. Follow-up with your primary care physician. Return to the ED for new or worsening symptoms.

## 2014-12-12 ENCOUNTER — Other Ambulatory Visit: Payer: Self-pay | Admitting: Endocrinology

## 2014-12-12 ENCOUNTER — Telehealth: Payer: Self-pay | Admitting: Endocrinology

## 2014-12-12 MED ORDER — INSULIN REGULAR HUMAN (CONC) 500 UNIT/ML ~~LOC~~ SOLN: SUBCUTANEOUS | Status: DC

## 2014-12-12 NOTE — Telephone Encounter (Signed)
See note below and please advise if ok to refill rx fosamax. Rx is listed under a historical provider. Rx for humulin R has been sent to the patients pharmacy.

## 2014-12-12 NOTE — Telephone Encounter (Signed)
Patient would like a refill on her medication   Rx: Fosamax and humulin R  Pharmacy: [facility]    Thank you

## 2014-12-12 NOTE — Telephone Encounter (Signed)
Rx sent to pharmacy   

## 2014-12-12 NOTE — Telephone Encounter (Signed)
Please decline refill of fosamax, as i only see pt for DM.

## 2014-12-12 NOTE — Telephone Encounter (Signed)
Please refill [facility]ulin [facility]

## 2014-12-12 NOTE — Telephone Encounter (Signed)
Pt notified of note below. 

## 2014-12-15 ENCOUNTER — Telehealth: Payer: Self-pay | Admitting: Endocrinology

## 2014-12-15 NOTE — Telephone Encounter (Signed)
Ok, please advise pharmacy of this

## 2014-12-15 NOTE — Telephone Encounter (Signed)
Please call [facility] for  Clarification on humulin r rx

## 2014-12-15 NOTE — Telephone Encounter (Signed)
Pt's pharmacy called needing clarification on pt's Humulin R 500 rx. The rx states she is injecting 60 units into the skin 3 times daily. Is this rx correct? Thanks!

## 2014-12-15 NOTE — Telephone Encounter (Signed)
I contacted pt and she states she is injecting 60 units 3 times a day. Pt pharmacy notified.

## 2014-12-15 NOTE — Telephone Encounter (Signed)
please call patient: Please tell us how many units you are currently taking.

## 2014-12-15 NOTE — Telephone Encounter (Signed)
Pt's pharmacy notified

## 2015-01-25 ENCOUNTER — Encounter (HOSPITAL_COMMUNITY): Payer: Self-pay | Admitting: *Deleted

## 2015-01-25 ENCOUNTER — Emergency Department (HOSPITAL_COMMUNITY): Payer: Medicaid Other

## 2015-01-25 ENCOUNTER — Inpatient Hospital Stay (HOSPITAL_COMMUNITY)
Admission: EM | Admit: 2015-01-25 | Discharge: 2015-01-27 | DRG: 103 | Disposition: A | Discharge status: Home / Self Care | Payer: Medicaid Other | Attending: Internal Medicine | Admitting: Internal Medicine

## 2015-01-25 DIAGNOSIS — E1143 Type 2 diabetes mellitus with diabetic autonomic (poly)neuropathy: Secondary | ICD-10-CM | POA: Diagnosis present

## 2015-01-25 DIAGNOSIS — E119 Type 2 diabetes mellitus without complications: Secondary | ICD-10-CM | POA: Diagnosis not present

## 2015-01-25 DIAGNOSIS — I69354 Hemiplegia and hemiparesis following cerebral infarction affecting left non-dominant side: Secondary | ICD-10-CM

## 2015-01-25 DIAGNOSIS — K219 Gastro-esophageal reflux disease without esophagitis: Secondary | ICD-10-CM | POA: Diagnosis present

## 2015-01-25 DIAGNOSIS — K3184 Gastroparesis: Secondary | ICD-10-CM | POA: Diagnosis present

## 2015-01-25 DIAGNOSIS — R51 Headache: Secondary | ICD-10-CM | POA: Diagnosis present

## 2015-01-25 DIAGNOSIS — M329 Systemic lupus erythematosus, unspecified: Secondary | ICD-10-CM | POA: Diagnosis present

## 2015-01-25 DIAGNOSIS — Z79899 Other long term (current) drug therapy: Secondary | ICD-10-CM

## 2015-01-25 DIAGNOSIS — I10 Essential (primary) hypertension: Secondary | ICD-10-CM | POA: Diagnosis present

## 2015-01-25 DIAGNOSIS — E1159 Type 2 diabetes mellitus with other circulatory complications: Secondary | ICD-10-CM | POA: Diagnosis present

## 2015-01-25 DIAGNOSIS — R531 Weakness: Secondary | ICD-10-CM

## 2015-01-25 DIAGNOSIS — F329 Major depressive disorder, single episode, unspecified: Secondary | ICD-10-CM | POA: Diagnosis present

## 2015-01-25 DIAGNOSIS — E785 Hyperlipidemia, unspecified: Secondary | ICD-10-CM | POA: Diagnosis present

## 2015-01-25 DIAGNOSIS — Z7902 Long term (current) use of antithrombotics/antiplatelets: Secondary | ICD-10-CM

## 2015-01-25 DIAGNOSIS — J9611 Chronic respiratory failure with hypoxia: Secondary | ICD-10-CM | POA: Diagnosis present

## 2015-01-25 DIAGNOSIS — M3500 Sicca syndrome, unspecified: Secondary | ICD-10-CM | POA: Diagnosis present

## 2015-01-25 DIAGNOSIS — Z885 Allergy status to narcotic agent status: Secondary | ICD-10-CM | POA: Diagnosis not present

## 2015-01-25 DIAGNOSIS — G4733 Obstructive sleep apnea (adult) (pediatric): Secondary | ICD-10-CM | POA: Diagnosis present

## 2015-01-25 DIAGNOSIS — E780 Pure hypercholesterolemia: Secondary | ICD-10-CM | POA: Diagnosis present

## 2015-01-25 DIAGNOSIS — IMO0002 Reserved for concepts with insufficient information to code with codable children: Secondary | ICD-10-CM | POA: Diagnosis present

## 2015-01-25 DIAGNOSIS — Z7983 Long term (current) use of bisphosphonates: Secondary | ICD-10-CM | POA: Diagnosis not present

## 2015-01-25 DIAGNOSIS — Z6841 Body Mass Index (BMI) 40.0 and over, adult: Secondary | ICD-10-CM

## 2015-01-25 DIAGNOSIS — M3320 Polymyositis, organ involvement unspecified: Secondary | ICD-10-CM | POA: Diagnosis present

## 2015-01-25 DIAGNOSIS — Z7982 Long term (current) use of aspirin: Secondary | ICD-10-CM | POA: Diagnosis not present

## 2015-01-25 DIAGNOSIS — R519 Headache, unspecified: Secondary | ICD-10-CM

## 2015-01-25 DIAGNOSIS — Z87891 Personal history of nicotine dependence: Secondary | ICD-10-CM | POA: Diagnosis not present

## 2015-01-25 DIAGNOSIS — G510 Bell's palsy: Secondary | ICD-10-CM

## 2015-01-25 DIAGNOSIS — R1110 Vomiting, unspecified: Secondary | ICD-10-CM | POA: Diagnosis not present

## 2015-01-25 DIAGNOSIS — E662 Morbid (severe) obesity with alveolar hypoventilation: Secondary | ICD-10-CM | POA: Diagnosis present

## 2015-01-25 DIAGNOSIS — F419 Anxiety disorder, unspecified: Secondary | ICD-10-CM | POA: Diagnosis present

## 2015-01-25 DIAGNOSIS — Z794 Long term (current) use of insulin: Secondary | ICD-10-CM | POA: Diagnosis not present

## 2015-01-25 DIAGNOSIS — G894 Chronic pain syndrome: Secondary | ICD-10-CM | POA: Diagnosis present

## 2015-01-25 DIAGNOSIS — Z9981 Dependence on supplemental oxygen: Secondary | ICD-10-CM

## 2015-01-25 DIAGNOSIS — Z888 Allergy status to other drugs, medicaments and biological substances status: Secondary | ICD-10-CM

## 2015-01-25 DIAGNOSIS — R112 Nausea with vomiting, unspecified: Secondary | ICD-10-CM | POA: Diagnosis present

## 2015-01-25 LAB — DIFFERENTIAL
Basophils Absolute: 0.1 10*3/uL (ref 0.0–0.1)
Basophils Relative: 1 % (ref 0–1)
EOS ABS: 0.2 10*3/uL (ref 0.0–0.7)
Eosinophils Relative: 2 % (ref 0–5)
Lymphocytes Relative: 37 % (ref 12–46)
Lymphs Abs: 3.2 10*3/uL (ref 0.7–4.0)
MONOS PCT: 10 % (ref 3–12)
Monocytes Absolute: 0.9 10*3/uL (ref 0.1–1.0)
Neutro Abs: 4.3 10*3/uL (ref 1.7–7.7)
Neutrophils Relative %: 50 % (ref 43–77)

## 2015-01-25 LAB — GLUCOSE, CAPILLARY
GLUCOSE-CAPILLARY: 223 mg/dL — AB (ref 65–99)
GLUCOSE-CAPILLARY: 274 mg/dL — AB (ref 65–99)
Glucose-Capillary: 213 mg/dL — ABNORMAL HIGH (ref 65–99)
Glucose-Capillary: 293 mg/dL — ABNORMAL HIGH (ref 65–99)
Glucose-Capillary: 262 mg/dL — ABNORMAL HIGH (ref 65–99)

## 2015-01-25 LAB — COMPREHENSIVE METABOLIC PANEL
ALBUMIN: 2.8 g/dL — AB (ref 3.5–5.0)
ALT: 10 U/L — ABNORMAL LOW (ref 14–54)
ANION GAP: 8 (ref 5–15)
AST: 18 U/L (ref 15–41)
Alkaline Phosphatase: 55 U/L (ref 38–126)
BUN: 9 mg/dL (ref 6–20)
CALCIUM: 8.5 mg/dL — AB (ref 8.9–10.3)
CO2: 25 mmol/L (ref 22–32)
Chloride: 107 mmol/L (ref 101–111)
Creatinine, Ser: 0.95 mg/dL (ref 0.44–1.00)
GFR calc Af Amer: >60 mL/min (ref >60)
GFR calc non Af Amer: >60 mL/min (ref >60)
Glucose, Bld: 198 mg/dL — ABNORMAL HIGH (ref 65–99)
Potassium: 3.8 mmol/L (ref 3.5–5.1)
Sodium: 140 mmol/L (ref 135–145)
Total Bilirubin: 0.4 mg/dL (ref 0.3–1.2)
Total Protein: 6.3 g/dL — ABNORMAL LOW (ref 6.5–8.1)

## 2015-01-25 LAB — APTT: aPTT: 25 seconds (ref 24–37)

## 2015-01-25 LAB — CBG MONITORING, ED: GLUCOSE-CAPILLARY: 177 mg/dL — AB (ref 65–99)

## 2015-01-25 LAB — CBC
HCT: 32.8 % — ABNORMAL LOW (ref 36.0–46.0)
Hemoglobin: 10.6 g/dL — ABNORMAL LOW (ref 12.0–15.0)
MCH: 26.6 pg (ref 26.0–34.0)
MCHC: 32.3 g/dL (ref 30.0–36.0)
MCV: 82.2 fL (ref 78.0–100.0)
Platelets: 338 10*3/uL (ref 150–400)
RBC: 3.99 MIL/uL (ref 3.87–5.11)
RDW: 16 % — AB (ref 11.5–15.5)
WBC: 8.7 10*3/uL (ref 4.0–10.5)

## 2015-01-25 LAB — PROTIME-INR
INR: 0.94 (ref 0.00–1.49)
Prothrombin Time: 12.8 seconds (ref 11.6–15.2)

## 2015-01-25 LAB — I-STAT CHEM 8, ED
BUN: 9 mg/dL (ref 6–20)
CHLORIDE: 108 mmol/L (ref 101–111)
CREATININE: 0.9 mg/dL (ref 0.44–1.00)
Calcium, Ion: 1.12 mmol/L (ref 1.12–1.23)
Glucose, Bld: 193 mg/dL — ABNORMAL HIGH (ref 65–99)
HEMATOCRIT: 35 % — AB (ref 36.0–46.0)
HEMOGLOBIN: 11.9 g/dL — AB (ref 12.0–15.0)
POTASSIUM: 3.8 mmol/L (ref 3.5–5.1)
SODIUM: 142 mmol/L (ref 135–145)
TCO2: 22 mmol/L (ref 0–100)

## 2015-01-25 LAB — I-STAT TROPONIN, ED: Troponin i, poc: 0.01 ng/mL (ref 0.00–0.08)

## 2015-01-25 MED ORDER — VITAMIN B-1 100 MG PO TABS
100.0000 mg | ORAL_TABLET | Freq: Every day | ORAL | Status: DC
  Administered 2015-01-26 – 2015-01-27 (×2): 100 mg via ORAL
  Filled 2015-01-25 (×2): qty 1

## 2015-01-25 MED ORDER — FOLIC ACID 1 MG PO TABS
1.0000 mg | ORAL_TABLET | Freq: Every day | ORAL | Status: DC
  Administered 2015-01-26 – 2015-01-27 (×2): 1 mg via ORAL
  Filled 2015-01-25 (×2): qty 1

## 2015-01-25 MED ORDER — ASPIRIN EC 81 MG PO TBEC
81.0000 mg | DELAYED_RELEASE_TABLET | Freq: Every day | ORAL | Status: DC
  Administered 2015-01-25 – 2015-01-27 (×3): 81 mg via ORAL
  Filled 2015-01-25 (×3): qty 1

## 2015-01-25 MED ORDER — DEXAMETHASONE SODIUM PHOSPHATE 10 MG/ML IJ SOLN
10.0000 mg | Freq: Once | INTRAMUSCULAR | Status: AC
  Administered 2015-01-25: 10 mg via INTRAVENOUS
  Filled 2015-01-25: qty 1

## 2015-01-25 MED ORDER — LOSARTAN POTASSIUM 50 MG PO TABS
100.0000 mg | ORAL_TABLET | Freq: Every day | ORAL | Status: DC
  Administered 2015-01-25 – 2015-01-27 (×3): 100 mg via ORAL
  Filled 2015-01-25 (×4): qty 2

## 2015-01-25 MED ORDER — THIAMINE HCL 100 MG/ML IJ SOLN
100.0000 mg | Freq: Every day | INTRAMUSCULAR | Status: DC
  Administered 2015-01-25: 100 mg via INTRAVENOUS
  Filled 2015-01-25: qty 2

## 2015-01-25 MED ORDER — ONDANSETRON HCL 4 MG/2ML IJ SOLN
4.0000 mg | INTRAMUSCULAR | Status: DC
  Administered 2015-01-25 – 2015-01-27 (×12): 4 mg via INTRAVENOUS
  Filled 2015-01-25 (×12): qty 2

## 2015-01-25 MED ORDER — LORAZEPAM 2 MG/ML IJ SOLN: 2.0000 mg | Freq: Once | INTRAMUSCULAR | Status: DC

## 2015-01-25 MED ORDER — PANTOPRAZOLE SODIUM 40 MG IV SOLR
40.0000 mg | INTRAVENOUS | Status: DC
  Administered 2015-01-25: 40 mg via INTRAVENOUS
  Filled 2015-01-25: qty 40

## 2015-01-25 MED ORDER — DIPHENHYDRAMINE HCL 50 MG/ML IJ SOLN
25.0000 mg | Freq: Once | INTRAMUSCULAR | Status: AC
  Administered 2015-01-25: 25 mg via INTRAVENOUS
  Filled 2015-01-25: qty 1

## 2015-01-25 MED ORDER — SODIUM CHLORIDE 0.9 % IV SOLN
INTRAVENOUS | Status: DC
  Administered 2015-01-25 – 2015-01-26 (×2): via INTRAVENOUS

## 2015-01-25 MED ORDER — CARVEDILOL 3.125 MG PO TABS
3.1250 mg | ORAL_TABLET | Freq: Two times a day (BID) | ORAL | Status: DC
  Administered 2015-01-25 – 2015-01-27 (×5): 3.125 mg via ORAL
  Filled 2015-01-25 (×5): qty 1

## 2015-01-25 MED ORDER — DEXAMETHASONE SODIUM PHOSPHATE 4 MG/ML IJ SOLN
4.0000 mg | Freq: Once | INTRAMUSCULAR | Status: AC
  Administered 2015-01-25: 4 mg via INTRAVENOUS
  Filled 2015-01-25: qty 1

## 2015-01-25 MED ORDER — PANTOPRAZOLE SODIUM 40 MG PO TBEC
40.0000 mg | DELAYED_RELEASE_TABLET | Freq: Every day | ORAL | Status: DC
  Administered 2015-01-26 – 2015-01-27 (×2): 40 mg via ORAL
  Filled 2015-01-25 (×2): qty 1

## 2015-01-25 MED ORDER — KETOROLAC TROMETHAMINE 15 MG/ML IJ SOLN
15.0000 mg | Freq: Four times a day (QID) | INTRAMUSCULAR | Status: DC
  Administered 2015-01-25 – 2015-01-27 (×9): 15 mg via INTRAVENOUS
  Filled 2015-01-25 (×9): qty 1

## 2015-01-25 MED ORDER — VALACYCLOVIR HCL 500 MG PO TABS
1000.0000 mg | ORAL_TABLET | Freq: Three times a day (TID) | ORAL | Status: DC
  Administered 2015-01-25: 1000 mg via ORAL
  Filled 2015-01-25 (×4): qty 2

## 2015-01-25 MED ORDER — AZATHIOPRINE 50 MG PO TABS
100.0000 mg | ORAL_TABLET | Freq: Two times a day (BID) | ORAL | Status: DC
  Administered 2015-01-25 – 2015-01-27 (×5): 100 mg via ORAL
  Filled 2015-01-25 (×6): qty 2

## 2015-01-25 MED ORDER — CITALOPRAM HYDROBROMIDE 10 MG PO TABS
10.0000 mg | ORAL_TABLET | Freq: Every evening | ORAL | Status: DC
  Administered 2015-01-25 – 2015-01-26 (×2): 10 mg via ORAL
  Filled 2015-01-25 (×3): qty 1

## 2015-01-25 MED ORDER — ISOSORB DINITRATE-HYDRALAZINE 20-37.5 MG PO TABS
1.0000 | ORAL_TABLET | Freq: Three times a day (TID) | ORAL | Status: DC
  Filled 2015-01-25 (×9): qty 1

## 2015-01-25 MED ORDER — KETOROLAC TROMETHAMINE 30 MG/ML IJ SOLN
30.0000 mg | Freq: Once | INTRAMUSCULAR | Status: AC
  Administered 2015-01-25: 30 mg via INTRAVENOUS
  Filled 2015-01-25: qty 1

## 2015-01-25 MED ORDER — CLOPIDOGREL BISULFATE 75 MG PO TABS
75.0000 mg | ORAL_TABLET | Freq: Every day | ORAL | Status: DC
  Administered 2015-01-25 – 2015-01-27 (×3): 75 mg via ORAL
  Filled 2015-01-25 (×3): qty 1

## 2015-01-25 MED ORDER — FOLIC ACID 5 MG/ML IJ SOLN
1.0000 mg | Freq: Every day | INTRAMUSCULAR | Status: DC
  Administered 2015-01-25: 1 mg via INTRAVENOUS
  Filled 2015-01-25: qty 0.2

## 2015-01-25 MED ORDER — KETOROLAC TROMETHAMINE 30 MG/ML IJ SOLN
15.0000 mg | Freq: Once | INTRAMUSCULAR | Status: AC
  Administered 2015-01-25: 15 mg via INTRAVENOUS
  Filled 2015-01-25: qty 1

## 2015-01-25 MED ORDER — PROMETHAZINE HCL 25 MG/ML IJ SOLN
25.0000 mg | Freq: Four times a day (QID) | INTRAMUSCULAR | Status: DC | PRN
  Administered 2015-01-25: 25 mg via INTRAVENOUS
  Filled 2015-01-25 (×4): qty 1

## 2015-01-25 MED ORDER — MORPHINE SULFATE 2 MG/ML IJ SOLN
1.0000 mg | INTRAMUSCULAR | Status: DC | PRN
  Administered 2015-01-25 (×3): 2 mg via INTRAVENOUS
  Administered 2015-01-25: 1 mg via INTRAVENOUS
  Administered 2015-01-26 – 2015-01-27 (×8): 2 mg via INTRAVENOUS
  Filled 2015-01-25 (×12): qty 1

## 2015-01-25 MED ORDER — ALBUTEROL SULFATE (2.5 MG/3ML) 0.083% IN NEBU: 2.5000 mg | INHALATION_SOLUTION | Freq: Four times a day (QID) | RESPIRATORY_TRACT | Status: DC | PRN

## 2015-01-25 MED ORDER — CYCLOSPORINE 0.05 % OP EMUL
1.0000 [drp] | Freq: Three times a day (TID) | OPHTHALMIC | Status: DC
  Administered 2015-01-25 – 2015-01-27 (×7): 1 [drp] via OPHTHALMIC
  Filled 2015-01-25 (×9): qty 1

## 2015-01-25 MED ORDER — LORAZEPAM 2 MG/ML IJ SOLN: 1.0000 mg | Freq: Four times a day (QID) | INTRAMUSCULAR | Status: DC | PRN

## 2015-01-25 MED ORDER — INSULIN ASPART 100 UNIT/ML ~~LOC~~ SOLN
0.0000 [IU] | SUBCUTANEOUS | Status: DC
  Administered 2015-01-25 (×2): 7 [IU] via SUBCUTANEOUS
  Administered 2015-01-25: 11 [IU] via SUBCUTANEOUS
  Administered 2015-01-25: 7 [IU] via SUBCUTANEOUS
  Administered 2015-01-26: 4 [IU] via SUBCUTANEOUS
  Administered 2015-01-26: 7 [IU] via SUBCUTANEOUS
  Administered 2015-01-26 (×4): 4 [IU] via SUBCUTANEOUS
  Administered 2015-01-27 (×2): 3 [IU] via SUBCUTANEOUS
  Administered 2015-01-27: 4 [IU] via SUBCUTANEOUS

## 2015-01-25 MED ORDER — HYDROXYCHLOROQUINE SULFATE 200 MG PO TABS
200.0000 mg | ORAL_TABLET | Freq: Two times a day (BID) | ORAL | Status: DC
  Administered 2015-01-25 – 2015-01-27 (×5): 200 mg via ORAL
  Filled 2015-01-25 (×5): qty 1

## 2015-01-25 MED ORDER — ALBUTEROL SULFATE HFA 108 (90 BASE) MCG/ACT IN AERS: 2.0000 | INHALATION_SPRAY | Freq: Four times a day (QID) | RESPIRATORY_TRACT | Status: DC | PRN

## 2015-01-25 MED ORDER — PREGABALIN 75 MG PO CAPS
75.0000 mg | ORAL_CAPSULE | Freq: Three times a day (TID) | ORAL | Status: DC
  Administered 2015-01-25 – 2015-01-27 (×7): 75 mg via ORAL
  Filled 2015-01-25 (×7): qty 1

## 2015-01-25 MED ORDER — SODIUM CHLORIDE 0.9 % IJ SOLN
3.0000 mL | Freq: Two times a day (BID) | INTRAMUSCULAR | Status: DC
  Administered 2015-01-26 – 2015-01-27 (×2): 3 mL via INTRAVENOUS

## 2015-01-25 MED ORDER — ENOXAPARIN SODIUM 40 MG/0.4ML ~~LOC~~ SOLN
40.0000 mg | SUBCUTANEOUS | Status: DC
  Administered 2015-01-25 – 2015-01-27 (×3): 40 mg via SUBCUTANEOUS
  Filled 2015-01-25 (×3): qty 0.4

## 2015-01-25 NOTE — ED Provider Notes (Signed)
CSN: [id number]     Arrival date & time 01/25/15  0009 History   None    This chart was scribed for [name] Biles, MD by Forrestine Him, ED Scribe. This patient was seen in room A10C/A10C and the patient's care was started 2:20 AM.   Chief Complaint  Patient presents with  . Headache   The history is provided by the patient. No language interpreter was used.    HPI Comments: [name] is a 44 y.o. female who presents to the Emergency Department complaining of constant, ongoing, unchanged frontal HA x 4-5 days. Pain is described as sharp/throbbing - different than migraine. There is associated photophobia. Ice application and OTC medications attempted prior to arrival without any improvement for symptoms. [name] also reports nausea, vomiting, dizziness, lightheadedness. 6-7 episodes of vomiting reported today. Tingling to the L side and upper extremity of the face onset 2-4 hours prior to arrival. Last normal in the morning.  No history of severe Migraines or HAs. PMHx includes HTN, Hyperlipidemia, GERD, CHF, Lupus, and stroke-L sided weakness.  Past Medical History  Diagnosis Date  . Bell's palsy 08/09/2010  . SLE 08/09/2010  . SJOGREN'S SYNDROME 08/09/2010  . Hypertension   . High cholesterol   . Anginal pain   . Obstructive sleep apnea on CPAP 2011  . Anxiety   . GERD (gastroesophageal reflux disease)   . CHF (congestive heart failure)   . Acute renal failure      Intractable nausea vomiting secondary to diabetic gastroparesis causing dehydration and acute renal failure /notes 04/01/2013  . Liver mass     biopsied 03/2013 at [facility], not malignant.  is to undergo radiologic ablation of the mass in sept/October 2014.   Marland Kitchen Pneumonia 2000's    "once" (04/01/2013)  . On home oxygen therapy     "2L 24/7" (04/01/2013)  . Migraines     "maybe a couple times/yr" (04/01/2013)  . Stroke 2010; 10/2012    "left side is still weak from it, never fully regained full strength; no additions from  stroke 10/2012"  . Obesity   . Family history of malignant neoplasm of breast   . Family history of malignant neoplasm of ovary   . Lupus   . Chest pain 01/13/2014  . Diabetic gastroparesis associated with type 2 diabetes mellitus     this is presumed diagnoses, not confirmed by any studies.   . Shortness of breath    Past Surgical History  Procedure Laterality Date  . Ectopic pregnancy surgery  1999  . Cardiac catheterization  02/07/12  . Hernia repair  0312'O    umbilical  . Epidermoid cyst excision  2007     left breast  . Esophagogastroduodenoscopy N/A 02/26/2013    Procedure: ESOPHAGOGASTRODUODENOSCOPY (EGD);  Surgeon: Irene Shipper, MD;  Location: [facility] ENDOSCOPY;  Service: Endoscopy;  Laterality: N/A;  . Breast cyst excision Left 2007    epidermoid  . Left and right heart catheterization with coronary angiogram N/A 02/07/2012    Procedure: LEFT AND RIGHT HEART CATHETERIZATION WITH CORONARY ANGIOGRAM;  Surgeon: Laverda Page, MD;  Location: [facility] CATH LAB;  Service: Cardiovascular;  Laterality: N/A;   Family History  Problem Relation Age of Onset  . Diabetes Mother   . Hypertension Other   . Stroke Other   . Arthritis Other   . Ovarian cancer Sister 46    maternal half-sister; RAD51C positive  . Breast cancer Maternal Aunt 59    currently 60  .  Stomach cancer Maternal Uncle     dx 87s; deceased  . Cancer Paternal Aunt     unk. primary; deceased 64s  . Prostate cancer Paternal Uncle     deceased 13s  . Breast cancer Cousin     deceased 65s; daughter of mat uncle with stomach ca  . Prostate cancer Maternal Uncle     deceased 53s  . Breast cancer Maternal Aunt     deceased 3s  . Cancer Maternal Aunt     unk. primary  . Cancer Maternal Aunt     unk. primary  . Ovarian cancer Maternal Aunt     deceased 29s   History  Substance Use Topics  . Smoking status: Former Smoker -- 3 years    Types: Cigarettes    Quit date: 06/01/1990  . Smokeless tobacco: Never Used  .  Alcohol Use: No   OB History    Gravida Para Term Preterm AB TAB SAB Ectopic Multiple Living   _0 Review of Systems  Eyes: Positive for visual disturbance.  Respiratory: Negative for shortness of breath.   Cardiovascular: Negative for chest pain.  Gastrointestinal: Positive for nausea and vomiting.  Neurological: Positive for dizziness, light-headedness and headaches.  All other systems reviewed and are negative.     Allergies  Metoclopramide; Oxycodone-acetaminophen; and Codeine  Home Medications   Prior to Admission medications   Medication Sig Start Date End Date Taking? Authorizing Provider  Albiglutide (TANZEUM) 30 MG PEN Inject 30 mg into the skin once a week. Sundays   Yes Historical Provider, MD  albuterol (PROAIR HFA) 108 (90 BASE) MCG/ACT inhaler Inhale 2 puffs into the lungs every 6 (six) hours as needed for wheezing or shortness of breath.    Yes Historical Provider, MD  albuterol (PROVENTIL) (5 MG/ML) 0.5% nebulizer solution Take 2.5 mg by nebulization every 6 (six) hours as needed for wheezing or shortness of breath.  06/24/14  Yes Historical Provider, MD  alendronate (FOSAMAX) 70 MG tablet Take 70 mg by mouth every Saturday. Take with a full glass of water on an empty stomach.   Yes Historical Provider, MD  aspirin EC 81 MG tablet Take 81 mg by mouth daily.   Yes Historical Provider, MD  azaTHIOprine (IMURAN) 50 MG tablet Take 100 mg by mouth 2 (two) times daily.    Yes Historical Provider, MD  carvedilol (COREG) 3.125 MG tablet Take 3.125 mg by mouth 2 (two) times daily with a meal.   Yes Historical Provider, MD  cetirizine (ZYRTEC) 10 MG tablet Take 10 mg by mouth daily.   Yes Historical Provider, MD  citalopram (CELEXA) 10 MG tablet Take 1 tablet (10 mg total) by mouth every evening. 04/06/13  Yes Belkys A Regalado, MD  clopidogrel (PLAVIX) 75 MG tablet Take 75 mg by mouth daily.   Yes Historical Provider, MD  cycloSPORINE (RESTASIS) 0.05 %  ophthalmic emulsion Place 1 drop into both eyes 3 (three) times daily.   Yes Historical Provider, MD  famotidine (PEPCID) 20 MG tablet Take 20 mg by mouth 2 (two) times daily.   Yes Historical Provider, MD  hydroxychloroquine (PLAQUENIL) 200 MG tablet Take 200 mg by mouth 2 (two) times daily.    Yes Historical Provider, MD  insulin regular human CONCENTRATED (HUMULIN R) 500 UNIT/ML SOLN injection INJECT 0.12 MLS (60 UNITS TOTAL) INTO THE SKIN 3 (THREE) TIMES DAILY WITH MEALS. 12/12/14  Yes Renato Shin, MD  isosorbide-hydrALAZINE (BIDIL) 20-37.5 MG per tablet Take 1 tablet by mouth 3 (three) times daily.   Yes Historical Provider, MD  LORazepam (ATIVAN) 1 MG tablet Take 1 tablet (1 mg total) by mouth every 6 (six) hours as needed for anxiety. 11/22/14  Yes Montine Circle, PA-C  losartan (COZAAR) 100 MG tablet Take 100 mg by mouth daily. 06/18/14  Yes Historical Provider, MD  metFORMIN (GLUCOPHAGE-XR) 500 MG 24 hr tablet Take 1,000 mg by mouth 2 (two) times daily.   Yes Historical Provider, MD  morphine (MSIR) 15 MG tablet Take 30 mg by mouth every evening.    Yes Historical Provider, MD  Polyvinyl Alcohol-Povidone (REFRESH OP) Place 1 drop into both eyes 2 (two) times daily.   Yes Historical Provider, MD  potassium chloride (MICRO-K) 10 MEQ CR capsule Take 10 mEq by mouth 2 (two) times daily.  08/21/14  Yes Historical Provider, MD  pregabalin (LYRICA) 75 MG capsule Take 75 mg by mouth 3 (three) times daily.   Yes Historical Provider, MD  torsemide (DEMADEX) 20 MG tablet Take 1 tablet (20 mg total) by mouth 2 (two) times daily. 04/06/13  Yes Belkys A Regalado, MD   Triage Vitals: BP 159/86 mmHg  Pulse 89  Temp(Src) 97.3 F (36.3 C) (Oral)  Resp 24  SpO2 100%  LMP 12/25/2014   Physical Exam  Constitutional: She is oriented to person, place, and time. She appears well-developed and well-nourished. No distress.  HENT:  Head: Normocephalic and atraumatic.  Eyes: EOM are normal.  Pupils are 1 mm and  equal  Neck: Normal range of motion.  Cardiovascular: Normal rate, regular rhythm and normal heart sounds.   Pulmonary/Chest: Effort normal and breath sounds normal.  Abdominal: Soft. She exhibits no distension. There is no tenderness.  Musculoskeletal: Normal range of motion.  Neurological: She is alert and oriented to person, place, and time.  L sided facial droop without central sparing  Subjective tingling on L side of pts face and L lower extremity  Mild drift to L lower extremity L sided grip is less that R sided grip Lower extremity exam is 4/5 No dysmetria on cerebellar examination   Skin: Skin is warm and dry.  Psychiatric: She has a normal mood and affect. Judgment normal.  Nursing note and vitals reviewed.   ED Course  Procedures (including critical care time)  DIAGNOSTIC STUDIES: Oxygen Saturation is 98% on RA, Normal by my interpretation.    COORDINATION OF CARE: 2:20 AM- Will order CT Head (Brain) without contrast, i-stat chem 8, PT-INR, APTT, CBC, CMP, i-stat troponin, CBG. Discussed treatment plan with pt at bedside and pt agreed to plan.     Labs Review Labs Reviewed  CBC - Abnormal; Notable for the following:    Hemoglobin 10.6 (*)    HCT 32.8 (*)    RDW 16.0 (*)    All other components within normal limits  COMPREHENSIVE METABOLIC PANEL - Abnormal; Notable for the following:    Glucose, Bld 198 (*)    Calcium 8.5 (*)    Total Protein 6.3 (*)    Albumin 2.8 (*)    ALT 10 (*)    All other components within normal limits  I-STAT CHEM 8, ED - Abnormal; Notable for the following:    Glucose, Bld 193 (*)    Hemoglobin 11.9 (*)    HCT 35.0 (*)    All other components within normal limits  CBG MONITORING, ED - Abnormal; Notable for the following:  Glucose-Capillary 177 (*)    All other components within normal limits  PROTIME-INR  APTT  DIFFERENTIAL  I-STAT TROPOININ, ED    Imaging Review Ct Head (brain) Wo Contrast  01/25/2015   CLINICAL DATA:   Headache for 4 days. Dizziness, nausea and vomiting. Bell's palsy, hypertension.  EXAM: CT HEAD WITHOUT CONTRAST  TECHNIQUE: Contiguous axial images were obtained from the base of the skull through the vertex without intravenous contrast.  COMPARISON:  CT head March 05, 2014  FINDINGS: The ventricles and sulci are normal. No intraparenchymal hemorrhage, mass effect nor midline shift. No acute large vascular territory infarcts.  No abnormal extra-axial fluid collections. Basal cisterns are patent.  No skull fracture. The included ocular globes and orbital contents are non-suspicious. Bilateral maxillary mucosal retention cyst without paranasal sinus air-fluid levels. Mild thickening of the RIGHT posterior ethmoid air cells. The mastoid air cells are well aerated. Small intra parotid lymph nodes. Large body habitus.  IMPRESSION: No acute intracranial process.   Electronically Signed   By: Elon Alas M.D.   On: 01/25/2015 02:54     EKG Interpretation None      _0  am: Pt has neg CT head. She will not fit MRI machine. Wll admit. Will consult Neuro.  MDM   Final diagnoses:  Bell's palsy  Acute left-sided weakness    I personally performed the services described in this documentation, which was scribed in my presence. The recorded information has been reviewed and is accurate.   Pt comes in with L sided weakness, headaches. Hx of Bell's palsy and migraines - however, current headaches are reportedly not typical of her migraines. Also has hx of strokes and she is diabetic, has SLE and other stroke risk factors. Facial exam is consistent with Bells Palsy - however, she has LUE subjective numbness, and objective weakness to the L side, which she reports is worse than her residual weakness from the stroke - so we will undertake stroke workup and get MRI. If MRI neg, pt will be discharged.  [name] Biles, MD 01/25/15 [id number]

## 2015-01-25 NOTE — ED Notes (Signed)
CBG CHECKED 177

## 2015-01-25 NOTE — ED Notes (Signed)
MRI just called back and stated that pt does not fit. Bring patient back to the ED

## 2015-01-25 NOTE — ED Notes (Signed)
The pt drove herself over here

## 2015-01-25 NOTE — Progress Notes (Signed)
Patient was started on CPAP, 10 cmH20 for HS.  Nasal mask used with 2L oxygen bleed in.  Patient wears CPAP at home and is familiar with equipment and procedure.

## 2015-01-25 NOTE — ED Notes (Signed)
Pt back from MRI 

## 2015-01-25 NOTE — H&P (Signed)
[facility] History and Physical                                                                                    [name], is a 44 y.o. female  MRN: [id number]   DOB - November 28, 1970  Admit Date - 01/25/2015  Outpatient Primary MD for the patient is Elyn Peers, MD  Referring MD: Kathrynn Humble / ER  With History of -  Past Medical History  Diagnosis Date  . Bell's palsy 08/09/2010  . SLE 08/09/2010  . SJOGREN'S SYNDROME 08/09/2010  . Hypertension   . High cholesterol   . Anginal pain   . Obstructive sleep apnea on CPAP 2011  . Anxiety   . GERD (gastroesophageal reflux disease)   . CHF (congestive heart failure)   . Acute renal failure      Intractable nausea vomiting secondary to diabetic gastroparesis causing dehydration and acute renal failure /notes 04/01/2013  . Liver mass     biopsied 03/2013 at [facility], not malignant.  is to undergo radiologic ablation of the mass in sept/October 2014.   Marland Kitchen Pneumonia 2000's    "once" (04/01/2013)  . On home oxygen therapy     "2L 24/7" (04/01/2013)  . Migraines     "maybe a couple times/yr" (04/01/2013)  . Stroke 2010; 10/2012    "left side is still weak from it, never fully regained full strength; no additions from stroke 10/2012"  . Obesity   . Family history of malignant neoplasm of breast   . Family history of malignant neoplasm of ovary   . Lupus   . Chest pain 01/13/2014  . Diabetic gastroparesis associated with type 2 diabetes mellitus     this is presumed diagnoses, not confirmed by any studies.   . Shortness of breath       Past Surgical History  Procedure Laterality Date  . Ectopic pregnancy surgery  1999  . Cardiac catheterization  02/07/12  . Hernia repair  5956'L    umbilical  . Epidermoid cyst excision  2007     left breast  . Esophagogastroduodenoscopy N/A 02/26/2013    Procedure: ESOPHAGOGASTRODUODENOSCOPY (EGD);  Surgeon: Irene Shipper, MD;  Location: [facility] ENDOSCOPY;  Service: Endoscopy;  Laterality: N/A;  .  Breast cyst excision Left 2007    epidermoid  . Left and right heart catheterization with coronary angiogram N/A 02/07/2012    Procedure: LEFT AND RIGHT HEART CATHETERIZATION WITH CORONARY ANGIOGRAM;  Surgeon: Laverda Page, MD;  Location: [facility] CATH LAB;  Service: Cardiovascular;  Laterality: N/A;    in for   Chief Complaint  Patient presents with  . Headache     HPI This is a 44 year old female patient with Bell's palsy affecting the left side at the face, systemic lupus with Sjogren syndrome, hypertension, dyslipidemia, morbid obesity, chronic respiratory failure secondary to obesity related hypoventilation syndrome on CPAP nocturnally as well as diabetes mellitus on insulin. Patient also has an apparent history of prior CVA with minimal left-sided deficits. Presented to ER with unchanged frontal headache for 4-5 days very sharp in nature but not typical of prior migraines. This was associated with photophobia the patient  had no fever or meningeal signs. She attempted use of over-the-counter medications without improvement in her symptoms. She subsequently developed nausea and vomiting, dizziness and lightheadedness. She reports inability to keep down oral medications including her chronic pain medications and her lupus medications. She is now reporting a diffuse myalgia muscle tenderness which she reports as typical of an impending Lupus exacerbation. Exam in the ER revealed no signs of acute focal neurological deficits noting patient has chronic left-sided facial weakness as evidenced by the eyelid and left lip drooping secondary to prior Bell's palsy. CT the head was unremarkable. An MRI of the brain was attempted but patient was too large to fit into the scanner so this test had to be aborted. Laboratory data was unremarkable. Troponin was normal. EKG revealed sinus rhythm with nonspecific ST-T wave changes, rare PVC, normal QTC. Her vital signs were stable: She was afebrile, BP was 162/86, pulse  rate 82 and respiratory rate was 22. Her O2 saturations were 98% on 2 L nasal cannula oxygen. Was given 2 mg of Decadron, Benadryl, 30 mg Toradol, and 2 mg of Ativan, as well as Valtrex in the ER.   Review of Systems   In addition to the HPI above,  No Fever-chills No new weakness, tingling, numbness in any extremity, No problems swallowing food or Liquids, indigestion/reflux No Chest pain or Shortness of Breath, palpitations, orthopnea or DOE-she did report dry cough that was described as consistent with paroxysmal episodes with associated posttussive emesis No Abdominal pain, no melena or hematochezia, no dark tarry stools, Bowel movements are regular, No dysuria, hematuria or flank pain No new skin rashes, lesions, masses or bruises, No new joints pains-aches No recent weight gain or loss No polyuria, polydypsia or polyphagia,  *A full 10 point Review of Systems was done, except as stated above, all other Review of Systems were negative.  Social History History  Substance Use Topics  . Smoking status: Former Smoker -- 3 years    Types: Cigarettes    Quit date: 06/01/1990  . Smokeless tobacco: Never Used  . Alcohol Use: No    Resides at: Private residence  Lives with: Alone  Ambulatory status: Without assistive devices   Family History Family History  Problem Relation Age of Onset  . Diabetes Mother   . Hypertension Other   . Stroke Other   . Arthritis Other   . Ovarian cancer Sister 48    maternal half-sister; RAD51C positive  . Breast cancer Maternal Aunt 34    currently 56  . Stomach cancer Maternal Uncle     dx 9s; deceased  . Cancer Paternal Aunt     unk. primary; deceased 81s  . Prostate cancer Paternal Uncle     deceased 63s  . Breast cancer Cousin     deceased 52s; daughter of mat uncle with stomach ca  . Prostate cancer Maternal Uncle     deceased 56s  . Breast cancer Maternal Aunt     deceased 27s  . Cancer Maternal Aunt     unk. primary  .  Cancer Maternal Aunt     unk. primary  . Ovarian cancer Maternal Aunt     deceased 48s     Prior to Admission medications   Medication Sig Start Date End Date Taking? Authorizing Provider  Albiglutide (TANZEUM) 30 MG PEN Inject 30 mg into the skin once a week. Sundays   Yes Historical Provider, MD  albuterol (PROAIR HFA) 108 (90 BASE) MCG/ACT inhaler Inhale 2  puffs into the lungs every 6 (six) hours as needed for wheezing or shortness of breath.    Yes Historical Provider, MD  albuterol (PROVENTIL) (5 MG/ML) 0.5% nebulizer solution Take 2.5 mg by nebulization every 6 (six) hours as needed for wheezing or shortness of breath.  06/24/14  Yes Historical Provider, MD  alendronate (FOSAMAX) 70 MG tablet Take 70 mg by mouth every Saturday. Take with a full glass of water on an empty stomach.   Yes Historical Provider, MD  aspirin EC 81 MG tablet Take 81 mg by mouth daily.   Yes Historical Provider, MD  azaTHIOprine (IMURAN) 50 MG tablet Take 100 mg by mouth 2 (two) times daily.    Yes Historical Provider, MD  carvedilol (COREG) 3.125 MG tablet Take 3.125 mg by mouth 2 (two) times daily with a meal.   Yes Historical Provider, MD  cetirizine (ZYRTEC) 10 MG tablet Take 10 mg by mouth daily.   Yes Historical Provider, MD  citalopram (CELEXA) 10 MG tablet Take 1 tablet (10 mg total) by mouth every evening. 04/06/13  Yes Belkys A Regalado, MD  clopidogrel (PLAVIX) 75 MG tablet Take 75 mg by mouth daily.   Yes Historical Provider, MD  cycloSPORINE (RESTASIS) 0.05 % ophthalmic emulsion Place 1 drop into both eyes 3 (three) times daily.   Yes Historical Provider, MD  famotidine (PEPCID) 20 MG tablet Take 20 mg by mouth 2 (two) times daily.   Yes Historical Provider, MD  hydroxychloroquine (PLAQUENIL) 200 MG tablet Take 200 mg by mouth 2 (two) times daily.    Yes Historical Provider, MD  insulin regular human CONCENTRATED (HUMULIN R) 500 UNIT/ML SOLN injection INJECT 0.12 MLS (60 UNITS TOTAL) INTO THE SKIN 3  (THREE) TIMES DAILY WITH MEALS. 12/12/14  Yes Renato Shin, MD  isosorbide-hydrALAZINE (BIDIL) 20-37.5 MG per tablet Take 1 tablet by mouth 3 (three) times daily.   Yes Historical Provider, MD  LORazepam (ATIVAN) 1 MG tablet Take 1 tablet (1 mg total) by mouth every 6 (six) hours as needed for anxiety. 11/22/14  Yes Montine Circle, PA-C  losartan (COZAAR) 100 MG tablet Take 100 mg by mouth daily. 06/18/14  Yes Historical Provider, MD  metFORMIN (GLUCOPHAGE-XR) 500 MG 24 hr tablet Take 1,000 mg by mouth 2 (two) times daily.   Yes Historical Provider, MD  morphine (MSIR) 15 MG tablet Take 30 mg by mouth every evening.    Yes Historical Provider, MD  Polyvinyl Alcohol-Povidone (REFRESH OP) Place 1 drop into both eyes 2 (two) times daily.   Yes Historical Provider, MD  potassium chloride (MICRO-K) 10 MEQ CR capsule Take 10 mEq by mouth 2 (two) times daily.  08/21/14  Yes Historical Provider, MD  pregabalin (LYRICA) 75 MG capsule Take 75 mg by mouth 3 (three) times daily.   Yes Historical Provider, MD  torsemide (DEMADEX) 20 MG tablet Take 1 tablet (20 mg total) by mouth 2 (two) times daily. 04/06/13  Yes Elmarie Shiley, MD    Allergies  Allergen Reactions  . Metoclopramide Other (See Comments)    Developed restless leg, akathisia type limb movements.   . Oxycodone-Acetaminophen Hives and Rash    FYI: no reaction to hydrocodone; tolerates dilaudid  . Codeine Itching, Rash and Other (See Comments)    (Derm)    Physical Exam  Vitals  Blood pressure 159/86, pulse 89, temperature 97.3 F (36.3 C), temperature source Oral, resp. rate 24, last menstrual period 12/25/2014, SpO2 100 %.   General:  In mild acute  distress as evidenced by ongoing headache and generalized myalgias  Psych:  Flat affect, Denies Suicidal or Homicidal ideations, Awake Alert, Oriented X 3. Speech and thought patterns are clear and appropriate, no apparent short term memory deficits  Neuro:   No focal neurological  deficits, CN II through XII intact except for chronic left eye ptosis and left facial drooping involving mouth, Strength 5/5 all 4 extremities, Sensation intact all 4 extremities. Continues to exhibit photophobia  ENT:  Ears and Eyes appear Normal, Conjunctivae clear, PER. Moist oral mucosa without erythema or exudates.  Neck:  Supple, No lymphadenopathy appreciated  Respiratory:  Symmetrical chest wall movement, Good air movement bilaterally, CTAB. Room Air  Cardiac:  RRR, No Murmurs, no LE edema noted, no JVD, No carotid bruits, peripheral pulses palpable at 2+  Abdomen:  Positive bowel sounds, Soft, Non tender, Non distended,  No masses appreciated, no obvious hepatosplenomegaly  Skin:  No Cyanosis, Normal Skin Turgor, No Skin Rash or Bruise.  Extremities: Symmetrical without obvious trauma or injury,  no effusions.  Data Review  CBC  Recent Labs Lab 01/25/15 0018 01/25/15 0029  WBC 8.7  --   HGB 10.6* 11.9*  HCT 32.8* 35.0*  PLT 338  --   MCV 82.2  --   MCH 26.6  --   MCHC 32.3  --   RDW 16.0*  --   LYMPHSABS 3.2  --   MONOABS 0.9  --   EOSABS 0.2  --   BASOSABS 0.1  --     Chemistries   Recent Labs Lab 01/25/15 0018 01/25/15 0029  NA 140 142  K 3.8 3.8  CL 107 108  CO2 25  --   GLUCOSE 198* 193*  BUN 9 9  CREATININE 0.95 0.90  CALCIUM 8.5*  --   AST 18  --   ALT 10*  --   ALKPHOS 55  --   BILITOT 0.4  --     CrCl cannot be calculated (Unknown ideal weight.).  No results for input(s): TSH, T4TOTAL, T3FREE, THYROIDAB in the last 72 hours.  Invalid input(s): FREET3  Coagulation profile  Recent Labs Lab 01/25/15 0018  INR 0.94    No results for input(s): DDIMER in the last 72 hours.  Cardiac Enzymes No results for input(s): CKMB, TROPONINI, MYOGLOBIN in the last 168 hours.  Invalid input(s): CK  Invalid input(s): POCBNP  Urinalysis    Component Value Date/Time   COLORURINE AMBER* 11/21/2014 2116   APPEARANCEUR CLEAR 11/21/2014 2116    LABSPEC 1.027 11/21/2014 2116   PHURINE 7.0 11/21/2014 2116   GLUCOSEU NEGATIVE 11/21/2014 2116   HGBUR NEGATIVE 11/21/2014 2116   BILIRUBINUR NEGATIVE 11/21/2014 2116   [place] NEGATIVE 11/21/2014 2116   PROTEINUR 30* 11/21/2014 2116   UROBILINOGEN 1.0 11/21/2014 2116   NITRITE NEGATIVE 11/21/2014 2116   LEUKOCYTESUR TRACE* 11/21/2014 2116    Imaging results:   Ct Head (brain) Wo Contrast  01/25/2015   CLINICAL DATA:  Headache for 4 days. Dizziness, nausea and vomiting. Bell's palsy, hypertension.  EXAM: CT HEAD WITHOUT CONTRAST  TECHNIQUE: Contiguous axial images were obtained from the base of the skull through the vertex without intravenous contrast.  COMPARISON:  CT head March 05, 2014  FINDINGS: The ventricles and sulci are normal. No intraparenchymal hemorrhage, mass effect nor midline shift. No acute large vascular territory infarcts.  No abnormal extra-axial fluid collections. Basal cisterns are patent.  No skull fracture. The included ocular globes and orbital contents are non-suspicious. Bilateral maxillary  mucosal retention cyst without paranasal sinus air-fluid levels. Mild thickening of the RIGHT posterior ethmoid air cells. The mastoid air cells are well aerated. Small intra parotid lymph nodes. Large body habitus.  IMPRESSION: No acute intracranial process.   Electronically Signed   By: Elon Alas M.D.   On: 01/25/2015 02:54     EKG: (Independently reviewed) sinus rhythm, normal QTC, nonspecific ST-T wave changes, rare PVC   Assessment & Plan  Principal Problem:   Severe headache/history of Bell's palsy/  Intractable nausea and vomiting -Admit to telemetry -Focus on resolving headache and nausea and vomiting with medical therapy as follows: Scheduled IV Toradol with IV Protonix, repeat IV Decadron noted was given 10 mg dose in the ER, replace home narcotics with when necessary IV morphine, scheduled Zofran with when necessary Phenergan -Clear liquids  initially -Symptoms ongoing for several days without new focal neurological deficits so low index of suspicion this is related to evolving stroke -If unable to manage symptoms as above may benefit from neurological evaluation  Active Problems:   Diabetes mellitus with insulin therapy -On U500 insulin at home equivalent to 60 units 3 times a day with meals -Insetting of ongoing nausea and vomiting we'll check CBGs every 4 hours and utilize resistance scale -Hold metformin in setting of nausea and vomiting as well -If CBGs unmanageable on above regimen can certainly begin lower dose of U500 insulin -Check hemoglobin A1c noting current CBGs in the 190s    HTN (hypertension) -Currently controlled -Have reordered all home medications; hopefully patient can at least keep these down with a sip of water -Demadex on hold    Chronic respiratory failure with hypoxia/ Morbid obesity -Continue daytime nasal cannula oxygen and CPAP at night    Diabetic gastroparesis -Certainly could be contributing to exacerbation of nausea and vomiting -Patient has allergy to Reglan (EPS)      Clinical polymyositis syndrome/Lupus -Patient has a chronic pain syndrome and is on Lyrica and morphine at home; he reports inability to keep down all medications secondary to nausea and vomiting -IV pain medications as above -Have reordered her typical lupus medications by mouth and hopeful she can keep these down-patient reporting generalized myalgias and chest wall tenderness with minimal palpation which is typical for her of an evolving lupus exacerbation    DVT Prophylaxis: Lovenox  Family Communication:   No family at bedside  Code Status:  Full code  Condition:  Stable  Discharge disposition: Anticipate discharge back to home once headache and nausea and vomiting resolved  Time spent in minutes : 60      ELLIS,ALLISON L. ANP on 01/25/2015 at 7:52 AM  Between 7am to 7pm - Pager - [phone]  After  7pm go to [email or web address] - password [email or web address]  And look for the night coverage person covering me after hours  [facility]  I have taken an interval history, reviewed the chart and examined the patient. 44 year old female with a history of Bell's palsy, SLE hypertension, dyslipidemia, morbid obesity came with worsening headache, generalized body aches, intractable nausea vomiting. Patient has chronic left-sided weakness from previous CVA and has Bell's palsy. CT head done in the ED was negative for acute abnormality. MRI brain could not be performed due to the patient's morbid obesity, she could not fit in the machine. We'll admit the patient for symptom management start on IV Toradol 15 mg every 6 hours , Zofran when necessary for nausea vomiting. Patient was taking morphine at home will start IV morphine  when necessary for pain. I agree with the Advanced Practice Provider's note, impression and recommendations. I have made any necessary editorial changes.

## 2015-01-25 NOTE — ED Notes (Signed)
The pt is c/o a headache  Dizziness and  Nv.  She has bells palsy and she feels, like that is worse.  Her bp has been higher today

## 2015-01-25 NOTE — ED Notes (Signed)
[facility] stroke and that was one year ago  And that left her with lt sided weakness.  She has lt facial   Also.

## 2015-01-25 NOTE — Progress Notes (Signed)
Pt arrived to 4N31 at [phone].  Pt A&O x 4, c/o 10/10 headache,  Telemetry applied, CCMD notified. Pt V/S taken, pt chronically on 2 L O2 via [place]  Pt has voided. Pt without distress. Diet ordered, will monitor.

## 2015-01-25 NOTE — ED Notes (Signed)
MD in room

## 2015-01-25 NOTE — ED Notes (Signed)
MRI called and pt is clostrophobic and anxious, Dr Kathrynn Humble notified and gave verbal order for 2mg  of ativan.

## 2015-01-25 NOTE — Consult Note (Addendum)
NEURO HOSPITALIST CONSULT NOTE    Reason for Consult:HA  HPI:                                                                                                                                          [name] is an 44 y.o. female with known Migraine HA, Lupus, Sjogren's syndrome bell's palsy. She noted her BP was elevated and had blurred vision along with inability to take PO medications.  Due to this her Lupus symptoms were worsening which caused her to go to ED.  While in ED her BP remained elevated and was admitted for Emesis and elevated BP.  She was noted to have HA while in ED and neurology was consulted for HA and increased weakness on the left arm and leg.  Patient has had a prior stroke with residual weakness on the left side.  Denies vertigo, double vision, slurred speech, confusion, or visual impairment. CT head was personally reviewed and showed no acute abnormality.   Past Medical History  Diagnosis Date  . Bell's palsy 08/09/2010  . SLE 08/09/2010  . SJOGREN'S SYNDROME 08/09/2010  . Hypertension   . High cholesterol   . Anginal pain   . Obstructive sleep apnea on CPAP 2011  . Anxiety   . GERD (gastroesophageal reflux disease)   . CHF (congestive heart failure)   . Acute renal failure      Intractable nausea vomiting secondary to diabetic gastroparesis causing dehydration and acute renal failure /notes 04/01/2013  . Liver mass     biopsied 03/2013 at [facility], not malignant.  is to undergo radiologic ablation of the mass in sept/October 2014.   Marland Kitchen Pneumonia 2000's    "once" (04/01/2013)  . On home oxygen therapy     "2L 24/7" (04/01/2013)  . Migraines     "maybe a couple times/yr" (04/01/2013)  . Stroke 2010; 10/2012    "left side is still weak from it, never fully regained full strength; no additions from stroke 10/2012"  . Obesity   . Family history of malignant neoplasm of breast   . Family history of malignant neoplasm of ovary   . Lupus   . Chest  pain 01/13/2014  . Diabetic gastroparesis associated with type 2 diabetes mellitus     this is presumed diagnoses, not confirmed by any studies.   . Shortness of breath     Past Surgical History  Procedure Laterality Date  . Ectopic pregnancy surgery  1999  . Cardiac catheterization  02/07/12  . Hernia repair  0240'X    umbilical  . Epidermoid cyst excision  2007     left breast  . Esophagogastroduodenoscopy N/A 02/26/2013    Procedure: ESOPHAGOGASTRODUODENOSCOPY (EGD);  Surgeon: Irene Shipper, MD;  Location: [place];  Service: Endoscopy;  Laterality: N/A;  . Breast cyst excision Left 2007    epidermoid  . Left and right heart catheterization with coronary angiogram N/A 02/07/2012    Procedure: LEFT AND RIGHT HEART CATHETERIZATION WITH CORONARY ANGIOGRAM;  Surgeon: Laverda Page, MD;  Location: [facility] CATH LAB;  Service: Cardiovascular;  Laterality: N/A;    Family History  Problem Relation Age of Onset  . Diabetes Mother   . Hypertension Other   . Stroke Other   . Arthritis Other   . Ovarian cancer Sister 77    maternal half-sister; RAD51C positive  . Breast cancer Maternal Aunt 58    currently 12  . Stomach cancer Maternal Uncle     dx 50s; deceased  . Cancer Paternal Aunt     unk. primary; deceased 94s  . Prostate cancer Paternal Uncle     deceased 8s  . Breast cancer Cousin     deceased 15s; daughter of mat uncle with stomach ca  . Prostate cancer Maternal Uncle     deceased 50s  . Breast cancer Maternal Aunt     deceased 52s  . Cancer Maternal Aunt     unk. primary  . Cancer Maternal Aunt     unk. primary  . Ovarian cancer Maternal Aunt     deceased 24s     Social History:  reports that she quit smoking about 24 years ago. Her smoking use included Cigarettes. She quit after 3 years of use. She has never used smokeless tobacco. She reports that she does not drink alcohol or use illicit drugs.  Allergies  Allergen Reactions  . Metoclopramide Other (See  Comments)    Developed restless leg, akathisia type limb movements.   . Oxycodone-Acetaminophen Hives and Rash    FYI: no reaction to hydrocodone; tolerates dilaudid  . Codeine Itching, Rash and Other (See Comments)    (Derm)    MEDICATIONS:                                                                                                                     Scheduled: . aspirin EC  81 mg Oral Daily  . azaTHIOprine  100 mg Oral BID  . carvedilol  3.125 mg Oral BID WC  . citalopram  10 mg Oral QPM  . clopidogrel  75 mg Oral Daily  . cycloSPORINE  1 drop Both Eyes TID  . enoxaparin (LOVENOX) injection  40 mg Subcutaneous Q24H  . folic acid  1 mg Intravenous Daily  . hydroxychloroquine  200 mg Oral BID  . insulin aspart  0-20 Units Subcutaneous 6 times per day  . isosorbide-hydrALAZINE  1 tablet Oral TID  . ketorolac  15 mg Intravenous 4 times per day  . losartan  100 mg Oral Daily  . ondansetron (ZOFRAN) IV  4 mg Intravenous Q4H  . pantoprazole (PROTONIX) IV  40 mg Intravenous Q24H  . pregabalin  75 mg Oral TID  . sodium chloride  3 mL Intravenous Q12H  .  thiamine  100 mg Intravenous Daily     ROS:                                                                                                                                       History obtained from the patient  General ROS: negative for - chills, fatigue, fever, night sweats, weight gain or weight loss Psychological ROS: negative for - behavioral disorder, hallucinations, memory difficulties, mood swings or suicidal ideation Ophthalmic ROS: negative for - blurry vision, double vision, eye pain or loss of vision ENT ROS: negative for - epistaxis, nasal discharge, oral lesions, sore throat, tinnitus or vertigo Allergy and Immunology ROS: negative for - hives or itchy/watery eyes Hematological and Lymphatic ROS: negative for - bleeding problems, bruising or swollen lymph nodes Endocrine ROS: negative for - galactorrhea, hair  pattern changes, polydipsia/polyuria or temperature intolerance Respiratory ROS: negative for - cough, hemoptysis, shortness of breath or wheezing Cardiovascular ROS: negative for - chest pain, dyspnea on exertion, edema or irregular heartbeat Gastrointestinal ROS: negative for - abdominal pain, diarrhea, hematemesis, nausea/vomiting or stool incontinence Genito-Urinary ROS: negative for - dysuria, hematuria, incontinence or urinary frequency/urgency Musculoskeletal ROS: negative for - joint swelling or muscular weakness Neurological ROS: as noted in HPI Dermatological ROS: negative for rash and skin lesion changes   Blood pressure 175/89, pulse 97, temperature 98.2 F (36.8 C), temperature source Oral, resp. rate 22, last menstrual period 12/25/2014, SpO2 95 %.   Physical Examination:                                                                                                      HEENT-  Normocephalic, no lesions, without obvious abnormality.  Normal external eye and conjunctiva.  Normal TM's bilaterally.  Normal auditory canals and external ears. Normal external nose, mucus membranes and septum.  Normal pharynx. Cardiovascular- S1, S2 normal, pulses palpable throughout   Lungs- chest clear, no wheezing, rales, normal symmetric air entry, Heart exam - S1, S2 normal, no murmur, no gallop, rate regular Abdomen- normal findings: bowel sounds normal Extremities- no edema Lymph-no adenopathy palpable Musculoskeletal-no joint tenderness, deformity or swelling Skin-warm and dry, no hyperpigmentation, vitiligo, or suspicious lesions  Neurological Examination Mental Status: Alert, oriented, thought content appropriate.  Speech dysarthric without evidence of aphasia.  Able to follow 3 step commands without difficulty. Cranial Nerves: II: Discs flat bilaterally; Visual fields grossly normal, pupils equal, round, reactive to light and accommodation III,IV, VI: ptosis left eye, extra-ocular  motions  intact bilaterally V,VII: smile asymmetric on the left, facial light touch sensation decreased on left VIII: hearing normal bilaterally IX,X: uvula rises symmetrically XI: bilateral shoulder shrug XII: midline tongue extension Motor: Right : Upper extremity   5/5    Left:     Upper extremity   4/5  Lower extremity   5/5     Lower extremity   4/5 --joint pain limited exam  Tone and bulk:normal tone throughout; no atrophy noted Sensory: Pinprick and light touch intact throughout, bilaterally Deep Tendon Reflexes:  Depressed throughout and difficult to obtain due to body habitus.  Plantars: Right: downgoing   Left: downgoing Cerebellar: normal finger-to-nose, and normal heel-to-shin test Gait: not tested due pain.       Lab Results: Basic Metabolic Panel:  Recent Labs Lab 01/25/15 0018 01/25/15 0029  NA 140 142  K 3.8 3.8  CL 107 108  CO2 25  --   GLUCOSE 198* 193*  BUN 9 9  CREATININE 0.95 0.90  CALCIUM 8.5*  --     Liver Function Tests:  Recent Labs Lab 01/25/15 0018  AST 18  ALT 10*  ALKPHOS 55  BILITOT 0.4  PROT 6.3*  ALBUMIN 2.8*   No results for input(s): LIPASE, AMYLASE in the last 168 hours. No results for input(s): AMMONIA in the last 168 hours.  CBC:  Recent Labs Lab 01/25/15 0018 01/25/15 0029  WBC 8.7  --   NEUTROABS 4.3  --   HGB 10.6* 11.9*  HCT 32.8* 35.0*  MCV 82.2  --   PLT 338  --     Cardiac Enzymes: No results for input(s): CKTOTAL, CKMB, CKMBINDEX, TROPONINI in the last 168 hours.  Lipid Panel: No results for input(s): CHOL, TRIG, HDL, CHOLHDL, VLDL, LDLCALC in the last 168 hours.  CBG:  Recent Labs Lab 01/25/15 0026 01/25/15 0900  GLUCAP 177* 213*    Microbiology: Results for orders placed or performed in visit on 06/06/14  GC/Chlamydia Probe Amp     Status: None   Collection Time: 06/06/14  4:00 PM  Result Value Ref Range Status   CT Probe RNA NEGATIVE  Final   GC Probe RNA NEGATIVE  Final     Comment:                                                                                         **Normal Reference Range: Negative**         Assay performed using the Gen-Probe APTIMA COMBO2 (R) Assay.   Acceptable specimen types for this assay include APTIMA Swabs (Unisex, endocervical, urethral, or vaginal), first void urine, and ThinPrep liquid based cytology samples.     Coagulation Studies:  Recent Labs  01/25/15 0018  LABPROT 12.8  INR 0.94    Imaging: Ct Head (brain) Wo Contrast  01/25/2015   CLINICAL DATA:  Headache for 4 days. Dizziness, nausea and vomiting. Bell's palsy, hypertension.  EXAM: CT HEAD WITHOUT CONTRAST  TECHNIQUE: Contiguous axial images were obtained from the base of the skull through the vertex without intravenous contrast.  COMPARISON:  CT head March 05, 2014  FINDINGS: The ventricles and sulci are  normal. No intraparenchymal hemorrhage, mass effect nor midline shift. No acute large vascular territory infarcts.  No abnormal extra-axial fluid collections. Basal cisterns are patent.  No skull fracture. The included ocular globes and orbital contents are non-suspicious. Bilateral maxillary mucosal retention cyst without paranasal sinus air-fluid levels. Mild thickening of the RIGHT posterior ethmoid air cells. The mastoid air cells are well aerated. Small intra parotid lymph nodes. Large body habitus.  IMPRESSION: No acute intracranial process.   Electronically Signed   By: Elon Alas M.D.   On: 01/25/2015 02:54   Assessment and plan per attending neurologist  Etta Quill PA-C [facility] [phone]  01/25/2015, 10:34 AM  Assessment/Plan: 44 YO female presenting to ED with increased joint pain, left sided weakness and HA in setting of lupus flair and elevated BP. Currently HA is well controlled and main complaint is joint pain. CT head reviewed and shows no acute stroke or abnormality.  MRI not able to obtain due to body habitus.    Recommend: 1) Repeat CT in AM 2) HA is well controlled at this time--would continue symptomatic treatment.   IF CT head is (-) will S/O  Patient seen and examined together with physician assistant and I concur with the assessment and plan.  Dorian Pod, MD

## 2015-01-26 ENCOUNTER — Inpatient Hospital Stay (HOSPITAL_COMMUNITY): Payer: Medicaid Other

## 2015-01-26 DIAGNOSIS — Z794 Long term (current) use of insulin: Secondary | ICD-10-CM

## 2015-01-26 DIAGNOSIS — E119 Type 2 diabetes mellitus without complications: Secondary | ICD-10-CM

## 2015-01-26 DIAGNOSIS — M329 Systemic lupus erythematosus, unspecified: Secondary | ICD-10-CM

## 2015-01-26 DIAGNOSIS — R51 Headache: Principal | ICD-10-CM

## 2015-01-26 DIAGNOSIS — J9611 Chronic respiratory failure with hypoxia: Secondary | ICD-10-CM

## 2015-01-26 DIAGNOSIS — R1110 Vomiting, unspecified: Secondary | ICD-10-CM

## 2015-01-26 LAB — BASIC METABOLIC PANEL
Anion gap: 5 (ref 5–15)
BUN: 14 mg/dL (ref 6–20)
CO2: 24 mmol/L (ref 22–32)
CREATININE: 1.05 mg/dL — AB (ref 0.44–1.00)
Calcium: 8.4 mg/dL — ABNORMAL LOW (ref 8.9–10.3)
Chloride: 110 mmol/L (ref 101–111)
GFR calc non Af Amer: >60 mL/min (ref >60)
Glucose, Bld: 185 mg/dL — ABNORMAL HIGH (ref 65–99)
POTASSIUM: 3.8 mmol/L (ref 3.5–5.1)
Sodium: 139 mmol/L (ref 135–145)

## 2015-01-26 LAB — SEDIMENTATION RATE: SED RATE: 43 mm/h — AB (ref 0–22)

## 2015-01-26 LAB — CBC
HCT: 31.1 % — ABNORMAL LOW (ref 36.0–46.0)
Hemoglobin: 9.6 g/dL — ABNORMAL LOW (ref 12.0–15.0)
MCH: 25.4 pg — AB (ref 26.0–34.0)
MCHC: 30.9 g/dL (ref 30.0–36.0)
MCV: 82.3 fL (ref 78.0–100.0)
Platelets: 321 10*3/uL (ref 150–400)
RBC: 3.78 MIL/uL — ABNORMAL LOW (ref 3.87–5.11)
RDW: 16.3 % — ABNORMAL HIGH (ref 11.5–15.5)
WBC: 9.1 10*3/uL (ref 4.0–10.5)

## 2015-01-26 LAB — GLUCOSE, CAPILLARY
GLUCOSE-CAPILLARY: 168 mg/dL — AB (ref 65–99)
GLUCOSE-CAPILLARY: 192 mg/dL — AB (ref 65–99)
GLUCOSE-CAPILLARY: 194 mg/dL — AB (ref 65–99)
Glucose-Capillary: 199 mg/dL — ABNORMAL HIGH (ref 65–99)
Glucose-Capillary: 188 mg/dL — ABNORMAL HIGH (ref 65–99)

## 2015-01-26 LAB — LIPASE, BLOOD: LIPASE: 29 U/L (ref 22–51)

## 2015-01-26 LAB — C-REACTIVE PROTEIN: CRP: <0.5 mg/dL (ref <1.0)

## 2015-01-26 LAB — HEMOGLOBIN A1C
HEMOGLOBIN A1C: 7.1 % — AB (ref 4.8–5.6)
MEAN PLASMA GLUCOSE: 157 mg/dL

## 2015-01-26 LAB — CK: CK TOTAL: 132 U/L (ref 38–234)

## 2015-01-26 MED ORDER — INSULIN GLARGINE 100 UNIT/ML ~~LOC~~ SOLN
18.0000 [IU] | Freq: Every day | SUBCUTANEOUS | Status: DC
  Administered 2015-01-26: 18 [IU] via SUBCUTANEOUS
  Filled 2015-01-26 (×3): qty 0.18

## 2015-01-26 MED ORDER — MORPHINE SULFATE 15 MG PO TABS: 15.0000 mg | ORAL_TABLET | Freq: Four times a day (QID) | ORAL | Status: DC | PRN

## 2015-01-26 NOTE — Progress Notes (Signed)
Subjective: No overnight events. Currently reports a mild headache, though much improved. Main concern is severe lower extremity joint pain.   Objective: Current vital signs: BP 133/76 mmHg  Pulse 93  Temp(Src) 98.3 F (36.8 C) (Oral)  Resp 19  Ht 5\' 8"  (1.727 m)  Wt 180.9 kg (398 lb 13 oz)  BMI 60.65 kg/m2  SpO2 100%  LMP 12/25/2014 Vital signs in last 24 hours: Temp:  [97.7 F (36.5 C)-98.8 F (37.1 C)] 98.3 F (36.8 C) (06/27 0534) Pulse Rate:  [79-97] 93 (06/27 0534) Resp:  [14-24] 19 (06/27 0534) BP: (123-186)/(58-92) 133/76 mmHg (06/27 0534) SpO2:  [95 %-100 %] 100 % (06/27 0534) Weight:  [180.9 kg (398 lb 13 oz)] 180.9 kg (398 lb 13 oz) (06/27 0400)  Intake/Output from previous day: 06/26 0701 - 06/27 0700 In: 240 [P.O.:240] Out: -  Intake/Output this shift:   Nutritional status: DIET SOFT Room service appropriate?: Yes; Fluid consistency:: Thin  Neurologic Exam: Neurological Examination Mental Status: Alert, oriented, thought content appropriate. Speech minimally dysarthric without evidence of aphasia.  Cranial Nerves: II: Visual fields grossly normal, pupils equal, round, reactive to light III,IV, VI: ptosis left eye, extra-ocular motions intact bilaterally V,VII: smile asymmetric on the left, facial light touch sensation decreased on left VIII: hearing normal bilaterally Motor: Right :Upper extremity 5/5Left: Upper extremity 4/5 Lower extremity 5/5Lower extremity 4/5 --joint pain limited exam  Tone and bulk:normal tone throughout; no atrophy noted Sensory: Pinprick and light touch intact throughout, bilaterally  Lab Results: Basic Metabolic Panel:  Recent Labs Lab 01/25/15 0018 01/25/15 0029 01/26/15 0305  NA 140 142 139  K 3.8 3.8 3.8  CL 107 108 110  CO2 25  --  24  GLUCOSE 198* 193* 185*  BUN 9 9 14   CREATININE 0.95 0.90 1.05*   CALCIUM 8.5*  --  8.4*    Liver Function Tests:  Recent Labs Lab 01/25/15 0018  AST 18  ALT 10*  ALKPHOS 55  BILITOT 0.4  PROT 6.3*  ALBUMIN 2.8*   No results for input(s): LIPASE, AMYLASE in the last 168 hours. No results for input(s): AMMONIA in the last 168 hours.  CBC:  Recent Labs Lab 01/25/15 0018 01/25/15 0029 01/26/15 0305  WBC 8.7  --  9.1  NEUTROABS 4.3  --   --   HGB 10.6* 11.9* 9.6*  HCT 32.8* 35.0* 31.1*  MCV 82.2  --  82.3  PLT 338  --  321    Cardiac Enzymes: No results for input(s): CKTOTAL, CKMB, CKMBINDEX, TROPONINI in the last 168 hours.  Lipid Panel: No results for input(s): CHOL, TRIG, HDL, CHOLHDL, VLDL, LDLCALC in the last 168 hours.  CBG:  Recent Labs Lab 01/25/15 1207 01/25/15 1647 01/25/15 1953 01/25/15 2355 01/26/15 0405  GLUCAP 293* 262* 274* 223* 199*    Microbiology: Results for orders placed or performed in visit on 06/06/14  GC/Chlamydia Probe Amp     Status: None   Collection Time: 06/06/14  4:00 PM  Result Value Ref Range Status   CT Probe RNA NEGATIVE  Final   GC Probe RNA NEGATIVE  Final    Comment:                                                                                         **  Normal Reference Range: Negative**         Assay performed using the Gen-Probe APTIMA COMBO2 (R) Assay.   Acceptable specimen types for this assay include APTIMA Swabs (Unisex, endocervical, urethral, or vaginal), first void urine, and ThinPrep liquid based cytology samples.     Coagulation Studies:  Recent Labs  01/25/15 0018  LABPROT 12.8  INR 0.94    Imaging: Ct Head (brain) Wo Contrast  01/25/2015   CLINICAL DATA:  Headache for 4 days. Dizziness, nausea and vomiting. Bell's palsy, hypertension.  EXAM: CT HEAD WITHOUT CONTRAST  TECHNIQUE: Contiguous axial images were obtained from the base of the skull through the vertex without intravenous contrast.  COMPARISON:  CT head March 05, 2014  FINDINGS: The ventricles  and sulci are normal. No intraparenchymal hemorrhage, mass effect nor midline shift. No acute large vascular territory infarcts.  No abnormal extra-axial fluid collections. Basal cisterns are patent.  No skull fracture. The included ocular globes and orbital contents are non-suspicious. Bilateral maxillary mucosal retention cyst without paranasal sinus air-fluid levels. Mild thickening of the RIGHT posterior ethmoid air cells. The mastoid air cells are well aerated. Small intra parotid lymph nodes. Large body habitus.  IMPRESSION: No acute intracranial process.   Electronically Signed   By: Elon Alas M.D.   On: 01/25/2015 02:54    Medications:  Scheduled: . aspirin EC  81 mg Oral Daily  . azaTHIOprine  100 mg Oral BID  . carvedilol  3.125 mg Oral BID WC  . citalopram  10 mg Oral QPM  . clopidogrel  75 mg Oral Daily  . cycloSPORINE  1 drop Both Eyes TID  . enoxaparin (LOVENOX) injection  40 mg Subcutaneous Q24H  . folic acid  1 mg Oral Daily  . hydroxychloroquine  200 mg Oral BID  . insulin aspart  0-20 Units Subcutaneous 6 times per day  . isosorbide-hydrALAZINE  1 tablet Oral TID  . ketorolac  15 mg Intravenous 4 times per day  . losartan  100 mg Oral Daily  . ondansetron (ZOFRAN) IV  4 mg Intravenous Q4H  . pantoprazole  40 mg Oral Daily  . pregabalin  75 mg Oral TID  . sodium chloride  3 mL Intravenous Q12H  . thiamine  100 mg Oral Daily    Assessment/Plan:  44 YO female presenting to ED with increased joint pain, left sided weakness and HA in setting of lupus flair and elevated BP. Reports mild headache that is much improved from admission. Main concern is continued lower extremity joint pain. Initial CT head reviewed and shows no acute stroke or abnormality. MRI not able to obtain due to body habitus.   Recommend: 1) Repeat CT pending 2) HA is well controlled at this time--would continue symptomatic treatment of joint pain and possible lupus flair.  3)If repeat head CT  unremarkable then no further neurological workup indicated at this time.     LOS: 1 day   Jim Like, DO [facility] [phone]  If 7pm- 7am, please page neurology on call as listed in [place]. 01/26/2015  7:25 AM

## 2015-01-26 NOTE — Progress Notes (Addendum)
PROGRESS NOTE  [name] QIW:[id number] DOB: [birth year] DOA: 01/25/2015 PCP: Elyn Peers, MD Brief history 44 year old female with a history of Bell's palsy affecting the left side of her face, diabetes mellitus, crossover Sjogren's/lupus syndrome, and anxiety presented with 4-5 day history of intractable frontal headache that was not typical for migraines. The patient denied any fever or neck pain. She has some dizziness with nausea and vomiting. She denied any chest pain, shortness breath, abdominal pain, dysuria, hematuria. Emergency Department, CT of the brain was nonacute. Unfortunately, MRI was not able to be performed secondary to the patient's body habitus. The patient was given Decadron, Benadryl, and Toradol in the emergency department. She continues to complain of increased arthralgias.  Assessment/Plan: Intractable headache -Improving with medical treatment including Benadryl, Toradol, dexamethasone -Appreciate neurology input -Repeat CT brain unremarkable Arthralgias in the setting of Sjogren's/lupus crossover syndrome -No clear synovitis on examination -Patient follows with rheumatology, Dr. Rogers Blocker, at [place] and Plaquenil -Check C3, C4, CH 50, CRP, sedimentation rate, CK -No clear synovitis on examination Hypertension -Continue carvedilol, losartan -Controlled -pt refuse BiDil as it is causing headache Chronic pain syndrome -Restart MSIR 50 mg every 6 hours when necessary pain -Continue Lyrica Chronic respiratory failure -Stable on 2 L -Patient is maintained on 2 L at home Diabetes mellitus type 2 -Start Lantus 18 units -NovoLog sliding scale -Hold metformin -11/21/2014 hemoglobin A1c 6.7  Depression/anxiety -Continue Celexa    Family Communication:   Pt at beside Disposition Plan:   Home 6/28 if stable     Procedures/Studies: Ct Head Wo Contrast  01/26/2015   CLINICAL DATA:  44 year old female presenting with severe joint  pain, LEFT-sided weakness, and headache in the setting of a lupus flare and elevated blood pressure. Headache much improved from admission. MRI not able to be obtained due to body habitus (reported weight of 398 lb). Stroke risk factors include hypertension, diabetes, and lupus.  EXAM: CT HEAD WITHOUT CONTRAST  TECHNIQUE: Contiguous axial images were obtained from the base of the skull through the vertex without intravenous contrast.  COMPARISON:  01/25/2015 most recent.  FINDINGS: No evidence for acute infarction, hemorrhage, mass lesion, hydrocephalus, or extra-axial fluid. No atrophy or white matter disease.  No visible early signs of cortical infarction. No subarachnoid blood. No large vessel infarct. No CT signs of proximal vascular thrombosis.  Intact calvarium. No acute sinus or mastoid disease. Stable RIGHT greater than LEFT maxillary sinus retention cyst  IMPRESSION: Negative exam.  No change from yesterday's normal CT.   Electronically Signed   By: Staci Righter M.D.   On: 01/26/2015 07:58   Ct Head (brain) Wo Contrast  01/25/2015   CLINICAL DATA:  Headache for 4 days. Dizziness, nausea and vomiting. Bell's palsy, hypertension.  EXAM: CT HEAD WITHOUT CONTRAST  TECHNIQUE: Contiguous axial images were obtained from the base of the skull through the vertex without intravenous contrast.  COMPARISON:  CT head March 05, 2014  FINDINGS: The ventricles and sulci are normal. No intraparenchymal hemorrhage, mass effect nor midline shift. No acute large vascular territory infarcts.  No abnormal extra-axial fluid collections. Basal cisterns are patent.  No skull fracture. The included ocular globes and orbital contents are non-suspicious. Bilateral maxillary mucosal retention cyst without paranasal sinus air-fluid levels. Mild thickening of the RIGHT posterior ethmoid air cells. The mastoid air cells are well aerated. Small intra parotid lymph nodes. Large body habitus.  IMPRESSION: No acute intracranial process.  Electronically Signed   By: Elon Alas M.D.   On: 01/25/2015 02:54         Subjective: Patient states that headache is improving. She is complaining of arthralgias in her ankles and knees. Denies any rashes, back pain, fevers, chills, vomiting, diarrhea, dysuria, hematuria. No abdominal pain or rashes.  Objective: Filed Vitals:   01/26/15 0534 01/26/15 0911 01/26/15 1320 01/26/15 1700  BP: 133/76 133/61 133/74 139/77  Pulse: 93 95 84 85  Temp: 98.3 F (36.8 C) 98.8 F (37.1 C) 98.5 F (36.9 C) 97.9 F (36.6 C)  TempSrc: Oral Oral Oral Oral  Resp: 19 20 16 20   Height:      Weight:      SpO2: 100% 100% 100% 100%    Intake/Output Summary (Last 24 hours) at 01/26/15 1849 Last data filed at 01/26/15 1321  Gross per 24 hour  Intake    720 ml  Output      0 ml  Net    720 ml   Weight change:  Exam:   General:  Pt is alert, follows commands appropriately, not in acute distress  HEENT: No icterus, No thrush, No neck mass, [place]/AT  Cardiovascular: RRR, S1/S2, no rubs, no gallops  Respiratory: CTA bilaterally, no wheezing, no crackles, no rhonchi  Abdomen: Soft/+BS, non tender, non distended, no guarding; no hepatosplenomegaly  Extremities: No edema, No lymphangitis, No petechiae, No rashes, no synovitis; no cyanosis or edema.  Data Reviewed: Basic Metabolic Panel:  Recent Labs Lab 01/25/15 0018 01/25/15 0029 01/26/15 0305  NA 140 142 139  K 3.8 3.8 3.8  CL 107 108 110  CO2 25  --  24  GLUCOSE 198* 193* 185*  BUN 9 9 14   CREATININE 0.95 0.90 1.05*  CALCIUM 8.5*  --  8.4*   Liver Function Tests:  Recent Labs Lab 01/25/15 0018  AST 18  ALT 10*  ALKPHOS 55  BILITOT 0.4  PROT 6.3*  ALBUMIN 2.8*    Recent Labs Lab 01/26/15 1531  LIPASE 29   No results for input(s): AMMONIA in the last 168 hours. CBC:  Recent Labs Lab 01/25/15 0018 01/25/15 0029 01/26/15 0305  WBC 8.7  --  9.1  NEUTROABS 4.3  --   --   HGB 10.6* 11.9* 9.6*  HCT  32.8* 35.0* 31.1*  MCV 82.2  --  82.3  PLT 338  --  321   Cardiac Enzymes:  Recent Labs Lab 01/26/15 1531  CKTOTAL 132   BNP: Invalid input(s): POCBNP CBG:  Recent Labs Lab 01/25/15 2355 01/26/15 0405 01/26/15 0800 01/26/15 1148 01/26/15 1626  GLUCAP 223* 199* 168* 192* 194*    No results found for this or any previous visit (from the past 240 hour(s)).   Scheduled Meds: . aspirin EC  81 mg Oral Daily  . azaTHIOprine  100 mg Oral BID  . carvedilol  3.125 mg Oral BID WC  . citalopram  10 mg Oral QPM  . clopidogrel  75 mg Oral Daily  . cycloSPORINE  1 drop Both Eyes TID  . enoxaparin (LOVENOX) injection  40 mg Subcutaneous Q24H  . folic acid  1 mg Oral Daily  . hydroxychloroquine  200 mg Oral BID  . insulin aspart  0-20 Units Subcutaneous 6 times per day  . insulin glargine  18 Units Subcutaneous QHS  . isosorbide-hydrALAZINE  1 tablet Oral TID  . ketorolac  15 mg Intravenous 4 times per day  . losartan  100 mg Oral Daily  .  ondansetron (ZOFRAN) IV  4 mg Intravenous Q4H  . pantoprazole  40 mg Oral Daily  . pregabalin  75 mg Oral TID  . sodium chloride  3 mL Intravenous Q12H  . thiamine  100 mg Oral Daily   Continuous Infusions: . sodium chloride 50 mL/hr at 01/26/15 0442     Mahina Salatino, DO  [facility] Pager [phone]  If 7PM-7AM, please contact night-coverage [email or web address] Password [facility] 01/26/2015, 6:49 PM   LOS: 1 day

## 2015-01-26 NOTE — Progress Notes (Signed)
Inpatient Diabetes Program Recommendations  AACE/ADA: New Consensus Statement on Inpatient Glycemic Control (2013)  Target Ranges:  Prepandial:   less than 140 mg/dL      Peak postprandial:   less than 180 mg/dL (1-2 hours)      Critically ill patients:  140 - 180 mg/dL   Results for ARYIAH, MONTEROSSO (MRN [id number]) as of 01/26/2015 10:54  Ref. Range 01/25/2015 09:00 01/25/2015 12:07 01/25/2015 16:47 01/25/2015 19:53 01/25/2015 23:55 01/26/2015 04:05 01/26/2015 08:00  Glucose-Capillary Latest Ref Range: 65-99 mg/dL 213 (H) 293 (H) 262 (H) 274 (H) 223 (H) 199 (H) 168 (H)    Diabetes history: DM2 Outpatient Diabetes medications: Humulin R U500 draws up 12 units on U100 syringe TID (which is 60 units of U500 TID with meals), Metformin XR 1000 mg BID  Current orders for Inpatient glycemic control:  Novolog 0-20 units Q4H  Inpatient Diabetes Program Recommendations Insulin - Basal: Patient takes Humulin R U500 (concentrated insulin) as an outpatient. Over the past 24 hours glucose has ranged from 168-293 mg/dl and patient has received a total of Novolog 47 units for correction. Please consider ordering Lantus 18 units Q24H starting now (based on 180 kg x 0.1 units).  Note: Patient uses Humulin U500 as an outpatient. According to the chart patient uses a U100 syringe and draws up 12 units of U500 TID which equal 60 units of U500 TID with meals. Patient was admitted with severe headache and has been given Decadron x 2 which is contributing to hyperglycemia.  Thanks, Barnie Alderman, RN, MSN, CCRN, CDE Diabetes Coordinator Inpatient Diabetes Program [phone] (Team Pager from [place] to [place]) [phone] ([place] office) [phone] [facility] office) [phone] [facility] office)

## 2015-01-27 LAB — BASIC METABOLIC PANEL
Anion gap: 5 (ref 5–15)
BUN: 17 mg/dL (ref 6–20)
CHLORIDE: 109 mmol/L (ref 101–111)
CO2: 25 mmol/L (ref 22–32)
Calcium: 7.9 mg/dL — ABNORMAL LOW (ref 8.9–10.3)
Creatinine, Ser: 0.98 mg/dL (ref 0.44–1.00)
GFR calc non Af Amer: >60 mL/min (ref >60)
Glucose, Bld: 128 mg/dL — ABNORMAL HIGH (ref 65–99)
Potassium: 4.1 mmol/L (ref 3.5–5.1)
Sodium: 139 mmol/L (ref 135–145)

## 2015-01-27 LAB — C3 COMPLEMENT: C3 Complement: 187 mg/dL — ABNORMAL HIGH (ref 82–167)

## 2015-01-27 LAB — COMPLEMENT, TOTAL: Compl, Total (CH50): >60 U/mL — ABNORMAL HIGH (ref 42–60)

## 2015-01-27 LAB — GLUCOSE, CAPILLARY
GLUCOSE-CAPILLARY: 128 mg/dL — AB (ref 65–99)
GLUCOSE-CAPILLARY: 167 mg/dL — AB (ref 65–99)
Glucose-Capillary: 145 mg/dL — ABNORMAL HIGH (ref 65–99)

## 2015-01-27 LAB — C4 COMPLEMENT: COMPLEMENT C4, BODY FLUID: 42 mg/dL (ref 14–44)

## 2015-01-27 NOTE — Discharge Summary (Signed)
Physician Discharge Summary  [name] FKC:[id number] DOB: July 26, 1971 DOA: 01/25/2015  PCP: Elyn Peers, MD  Admit date: 01/25/2015 Discharge date: 01/27/2015  Recommendations for Outpatient Follow-up:  1. Pt will need to follow up with PCP in 2 weeks post discharge 2. Please obtain BMP and CBC in 1-2 weeks 3. Please follow up on results of C3, C4, CH 50  Discharge Diagnoses:  Intractable headache with N/V -Improving with medical treatment including Benadryl, Toradol, dexamethasone -Appreciate neurology input -Repeat CT brain unremarkable -improved with conservative management -unable to perform MRI due to body habitus -pt tolerated diet prior to d/c Arthralgias in the setting of Sjogren's/lupus crossover syndrome -No clear synovitis on examination -Patient follows with rheumatology, Dr. Rogers Blocker, at [place], C4, [place] 50--results pending at this time. Please follow up on these results -CRP--<0.5,  --sedimentation rate 43 --CK--132 -No clear synovitis on examination--in combination with CRP, CPK, sedimentation rate, I do not feel that clinically the patient is having an exacerbation at this time -The patient was obstructed to follow-up with her rheumatologist, Dr. Jerilee Field at [facility] states that she has an appointment in approximately 2 weeks Hypertension -Continue carvedilol, losartan -Controlled -pt refuse BiDil as it is causing headache--the patient was encouraged to follow up with her primary care provider to discuss further options  -Nevertheless, the patient's blood pressure remained stable with carvedilol and losartan during the hospitalization  Chronic pain syndrome -Restart MSIR 50 mg every 6 hours when necessary pain -Continue Lyrica Chronic respiratory failure -Stable on 2 L -Patient is maintained on 2 L at home Diabetes mellitus type 2 -Start Lantus 18 units During the hospitalization  -NovoLog sliding scale -Hold  metformin-->restart after d/c -11/21/2014 hemoglobin A1c 6.7  -The patient will be restarted back on her home regimen after discharge  -continue Tanzeum after d/c Depression/anxiety -Continue Celexa  Discharge Condition: stable  Disposition:  Follow-up Information    Follow up with Elyn Peers, MD In 1 week.   Specialty:  Family Medicine   Contact information:   [place] [address] [phone]     home  Diet:carb modified Wt Readings from Last 3 Encounters:  01/26/15 180.9 kg (398 lb 13 oz)  11/21/14 181.977 kg (401 lb 3 oz)  11/21/14 181.892 kg (401 lb)    History of present illness:  44 year old female with a history of Bell's palsy affecting the left side of her face, diabetes mellitus, crossover Sjogren's/lupus syndrome, and anxiety presented with 4-5 day history of intractable frontal headache that was not typical for migraines. The patient denied any fever or neck pain. She has some dizziness with nausea and vomiting. She denied any chest pain, shortness breath, abdominal pain, dysuria, hematuria. Emergency Department, CT of the brain was nonacute. Unfortunately, MRI was not able to be performed secondary to the patient's body habitus. The patient was given Decadron, Benadryl, and Toradol in the emergency department. She continues to complain of increased arthralgias. However, CRP was less than 0.5, and there was no signs of synovitis on examination.. C3, C4, CH 50 was still pending at the time of discharge. The patient encouraged to follow-up with her rheumatologist. The patient's headache continued to improve with symptomatic treatment. Patient was instructed to seek medical care immediately if she continues to have worsening headache, fevers, or focal neurologic deficits.  Consultants: Neurology  Discharge Exam: Filed Vitals:   01/27/15 0600  BP: 154/84  Pulse: 73  Temp: 98 F (36.7 C)  Resp: 18   Filed Vitals:   01/26/15 2127 01/26/15  2319 01/27/15 0146 01/27/15 0600  BP: 139/84  154/91 154/84  Pulse: 85 82 73 73  Temp: 98.3 F (36.8 C)  98.1 F (36.7 C) 98 F (36.7 C)  TempSrc: Oral  Oral Oral  Resp: 18 16 16 18   Height:      Weight:      SpO2: 100% 100% 97% 98%   General: A&O x 3, NAD, pleasant, cooperative Cardiovascular: RRR, no rub, no gallop, no S3 Respiratory: CTAB, no wheeze, no rhonchi Abdomen:soft, nontender, nondistended, positive bowel sounds Extremities: 1+LE edema, No lymphangitis, no petechiae; no synovitis  Discharge Instructions      Discharge Instructions    Diet - low sodium heart healthy    Complete by:  As directed      Increase activity slowly    Complete by:  As directed             Medication List    TAKE these medications        alendronate 70 MG tablet  Commonly known as:  FOSAMAX  Take 70 mg by mouth every Saturday. Take with a full glass of water on an empty stomach.     aspirin EC 81 MG tablet  Take 81 mg by mouth daily.     azaTHIOprine 50 MG tablet  Commonly known as:  IMURAN  Take 100 mg by mouth 2 (two) times daily.     cetirizine 10 MG tablet  Commonly known as:  ZYRTEC  Take 10 mg by mouth daily.     citalopram 10 MG tablet  Commonly known as:  CELEXA  Take 1 tablet (10 mg total) by mouth every evening.     clopidogrel 75 MG tablet  Commonly known as:  PLAVIX  Take 75 mg by mouth daily.     COREG 3.125 MG tablet  Generic drug:  carvedilol  Take 3.125 mg by mouth 2 (two) times daily with a meal.     cycloSPORINE 0.05 % ophthalmic emulsion  Commonly known as:  RESTASIS  Place 1 drop into both eyes 3 (three) times daily.     famotidine 20 MG tablet  Commonly known as:  PEPCID  Take 20 mg by mouth 2 (two) times daily.     hydroxychloroquine 200 MG tablet  Commonly known as:  PLAQUENIL  Take 200 mg by mouth 2 (two) times daily.     insulin regular human CONCENTRATED 500 UNIT/ML injection  Commonly known as:  HUMULIN R  INJECT 0.12 MLS (60  UNITS TOTAL) INTO THE SKIN 3 (THREE) TIMES DAILY WITH MEALS.     isosorbide-hydrALAZINE 20-37.5 MG per tablet  Commonly known as:  BIDIL  Take 1 tablet by mouth 3 (three) times daily.     LORazepam 1 MG tablet  Commonly known as:  ATIVAN  Take 1 tablet (1 mg total) by mouth every 6 (six) hours as needed for anxiety.     losartan 100 MG tablet  Commonly known as:  COZAAR  Take 100 mg by mouth daily.     metFORMIN 500 MG 24 hr tablet  Commonly known as:  GLUCOPHAGE-XR  Take 1,000 mg by mouth 2 (two) times daily.     morphine 15 MG tablet  Commonly known as:  MSIR  Take 30 mg by mouth every evening.     potassium chloride 10 MEQ CR capsule  Commonly known as:  MICRO-K  Take 10 mEq by mouth  2 (two) times daily.     pregabalin 75 MG capsule  Commonly known as:  LYRICA  Take 75 mg by mouth 3 (three) times daily.     albuterol (5 MG/ML) 0.5% nebulizer solution  Commonly known as:  PROVENTIL  Take 2.5 mg by nebulization every 6 (six) hours as needed for wheezing or shortness of breath.     PROAIR HFA 108 (90 BASE) MCG/ACT inhaler  Generic drug:  albuterol  Inhale 2 puffs into the lungs every 6 (six) hours as needed for wheezing or shortness of breath.     REFRESH OP  Place 1 drop into both eyes 2 (two) times daily.     TANZEUM 30 MG Pen  Generic drug:  Albiglutide  Inject 30 mg into the skin once a week. Sundays     torsemide 20 MG tablet  Commonly known as:  DEMADEX  Take 1 tablet (20 mg total) by mouth 2 (two) times daily.         The results of significant diagnostics from this hospitalization (including imaging, microbiology, ancillary and laboratory) are listed below for reference.    Significant Diagnostic Studies: Ct Head Wo Contrast  01/26/2015   CLINICAL DATA:  44 year old female presenting with severe joint pain, LEFT-sided weakness, and headache in the setting of a lupus flare and elevated blood pressure. Headache much improved from admission. MRI not able  to be obtained due to body habitus (reported weight of 398 lb). Stroke risk factors include hypertension, diabetes, and lupus.  EXAM: CT HEAD WITHOUT CONTRAST  TECHNIQUE: Contiguous axial images were obtained from the base of the skull through the vertex without intravenous contrast.  COMPARISON:  01/25/2015 most recent.  FINDINGS: No evidence for acute infarction, hemorrhage, mass lesion, hydrocephalus, or extra-axial fluid. No atrophy or white matter disease.  No visible early signs of cortical infarction. No subarachnoid blood. No large vessel infarct. No CT signs of proximal vascular thrombosis.  Intact calvarium. No acute sinus or mastoid disease. Stable RIGHT greater than LEFT maxillary sinus retention cyst  IMPRESSION: Negative exam.  No change from yesterday's normal CT.   Electronically Signed   By: Staci Righter M.D.   On: 01/26/2015 07:58   Ct Head (brain) Wo Contrast  01/25/2015   CLINICAL DATA:  Headache for 4 days. Dizziness, nausea and vomiting. Bell's palsy, hypertension.  EXAM: CT HEAD WITHOUT CONTRAST  TECHNIQUE: Contiguous axial images were obtained from the base of the skull through the vertex without intravenous contrast.  COMPARISON:  CT head March 05, 2014  FINDINGS: The ventricles and sulci are normal. No intraparenchymal hemorrhage, mass effect nor midline shift. No acute large vascular territory infarcts.  No abnormal extra-axial fluid collections. Basal cisterns are patent.  No skull fracture. The included ocular globes and orbital contents are non-suspicious. Bilateral maxillary mucosal retention cyst without paranasal sinus air-fluid levels. Mild thickening of the RIGHT posterior ethmoid air cells. The mastoid air cells are well aerated. Small intra parotid lymph nodes. Large body habitus.  IMPRESSION: No acute intracranial process.   Electronically Signed   By: Elon Alas M.D.   On: 01/25/2015 02:54     Microbiology: No results found for this or any previous visit (from  the past 240 hour(s)).   Labs: Basic Metabolic Panel:  Recent Labs Lab 01/25/15 0018 01/25/15 0029 01/26/15 0305 01/27/15 0646  NA 140 142 139 139  K 3.8 3.8 3.8 4.1  CL 107 108 110 109  CO2 25  --  24  25  GLUCOSE 198* 193* 185* 128*  BUN 9 9 14 17   CREATININE 0.95 0.90 1.05* 0.98  CALCIUM 8.5*  --  8.4* 7.9*   Liver Function Tests:  Recent Labs Lab 01/25/15 0018  AST 18  ALT 10*  ALKPHOS 55  BILITOT 0.4  PROT 6.3*  ALBUMIN 2.8*    Recent Labs Lab 01/26/15 1531  LIPASE 29   No results for input(s): AMMONIA in the last 168 hours. CBC:  Recent Labs Lab 01/25/15 0018 01/25/15 0029 01/26/15 0305  WBC 8.7  --  9.1  NEUTROABS 4.3  --   --   HGB 10.6* 11.9* 9.6*  HCT 32.8* 35.0* 31.1*  MCV 82.2  --  82.3  PLT 338  --  321   Cardiac Enzymes:  Recent Labs Lab 01/26/15 1531  CKTOTAL 132   BNP: Invalid input(s): POCBNP CBG:  Recent Labs Lab 01/26/15 1626 01/26/15 2003 01/27/15 0012 01/27/15 0406 01/27/15 0743  GLUCAP 194* 188* 167* 145* 128*    Time coordinating discharge:  Greater than 30 minutes  Signed:  Kaylea Mounsey, DO [facility] Pager: [phone] 01/27/2015, 8:43 AM

## 2015-01-27 NOTE — Progress Notes (Signed)
Pt d/c to home by car with family. Assessment stable. All questions answered. 

## 2015-02-20 ENCOUNTER — Ambulatory Visit: Payer: Medicaid Other | Admitting: Endocrinology

## 2015-02-27 ENCOUNTER — Encounter (HOSPITAL_COMMUNITY): Payer: Self-pay | Admitting: Emergency Medicine

## 2015-02-27 ENCOUNTER — Inpatient Hospital Stay (HOSPITAL_COMMUNITY)
Admission: EM | Admit: 2015-02-27 | Discharge: 2015-03-05 | DRG: 392 | Disposition: A | Discharge status: Home / Self Care | Payer: Medicaid Other | Attending: Internal Medicine | Admitting: Internal Medicine

## 2015-02-27 ENCOUNTER — Emergency Department (HOSPITAL_COMMUNITY): Payer: Medicaid Other

## 2015-02-27 DIAGNOSIS — Z7982 Long term (current) use of aspirin: Secondary | ICD-10-CM

## 2015-02-27 DIAGNOSIS — M3500 Sicca syndrome, unspecified: Secondary | ICD-10-CM | POA: Diagnosis present

## 2015-02-27 DIAGNOSIS — R1110 Vomiting, unspecified: Secondary | ICD-10-CM | POA: Diagnosis not present

## 2015-02-27 DIAGNOSIS — G894 Chronic pain syndrome: Secondary | ICD-10-CM | POA: Diagnosis present

## 2015-02-27 DIAGNOSIS — G4733 Obstructive sleep apnea (adult) (pediatric): Secondary | ICD-10-CM | POA: Diagnosis present

## 2015-02-27 DIAGNOSIS — I5032 Chronic diastolic (congestive) heart failure: Secondary | ICD-10-CM | POA: Diagnosis present

## 2015-02-27 DIAGNOSIS — K219 Gastro-esophageal reflux disease without esophagitis: Secondary | ICD-10-CM | POA: Diagnosis present

## 2015-02-27 DIAGNOSIS — K3184 Gastroparesis: Principal | ICD-10-CM | POA: Diagnosis present

## 2015-02-27 DIAGNOSIS — G43909 Migraine, unspecified, not intractable, without status migrainosus: Secondary | ICD-10-CM | POA: Insufficient documentation

## 2015-02-27 DIAGNOSIS — I10 Essential (primary) hypertension: Secondary | ICD-10-CM | POA: Diagnosis present

## 2015-02-27 DIAGNOSIS — R52 Pain, unspecified: Secondary | ICD-10-CM | POA: Diagnosis present

## 2015-02-27 DIAGNOSIS — IMO0002 Reserved for concepts with insufficient information to code with codable children: Secondary | ICD-10-CM | POA: Diagnosis present

## 2015-02-27 DIAGNOSIS — R112 Nausea with vomiting, unspecified: Secondary | ICD-10-CM | POA: Diagnosis present

## 2015-02-27 DIAGNOSIS — Z6841 Body Mass Index (BMI) 40.0 and over, adult: Secondary | ICD-10-CM

## 2015-02-27 DIAGNOSIS — Z7952 Long term (current) use of systemic steroids: Secondary | ICD-10-CM

## 2015-02-27 DIAGNOSIS — D649 Anemia, unspecified: Secondary | ICD-10-CM | POA: Diagnosis present

## 2015-02-27 DIAGNOSIS — Z7983 Long term (current) use of bisphosphonates: Secondary | ICD-10-CM

## 2015-02-27 DIAGNOSIS — R1013 Epigastric pain: Secondary | ICD-10-CM | POA: Insufficient documentation

## 2015-02-27 DIAGNOSIS — K210 Gastro-esophageal reflux disease with esophagitis: Secondary | ICD-10-CM | POA: Diagnosis present

## 2015-02-27 DIAGNOSIS — Z79891 Long term (current) use of opiate analgesic: Secondary | ICD-10-CM

## 2015-02-27 DIAGNOSIS — Z87891 Personal history of nicotine dependence: Secondary | ICD-10-CM

## 2015-02-27 DIAGNOSIS — G44221 Chronic tension-type headache, intractable: Secondary | ICD-10-CM | POA: Insufficient documentation

## 2015-02-27 DIAGNOSIS — F419 Anxiety disorder, unspecified: Secondary | ICD-10-CM | POA: Diagnosis present

## 2015-02-27 DIAGNOSIS — Z7902 Long term (current) use of antithrombotics/antiplatelets: Secondary | ICD-10-CM

## 2015-02-27 DIAGNOSIS — Z8673 Personal history of transient ischemic attack (TIA), and cerebral infarction without residual deficits: Secondary | ICD-10-CM

## 2015-02-27 DIAGNOSIS — E1143 Type 2 diabetes mellitus with diabetic autonomic (poly)neuropathy: Secondary | ICD-10-CM | POA: Diagnosis present

## 2015-02-27 DIAGNOSIS — E1159 Type 2 diabetes mellitus with other circulatory complications: Secondary | ICD-10-CM | POA: Diagnosis present

## 2015-02-27 DIAGNOSIS — Z79899 Other long term (current) drug therapy: Secondary | ICD-10-CM

## 2015-02-27 DIAGNOSIS — R110 Nausea: Secondary | ICD-10-CM

## 2015-02-27 DIAGNOSIS — Z794 Long term (current) use of insulin: Secondary | ICD-10-CM

## 2015-02-27 DIAGNOSIS — M329 Systemic lupus erythematosus, unspecified: Secondary | ICD-10-CM | POA: Diagnosis present

## 2015-02-27 DIAGNOSIS — Z9981 Dependence on supplemental oxygen: Secondary | ICD-10-CM

## 2015-02-27 DIAGNOSIS — E780 Pure hypercholesterolemia: Secondary | ICD-10-CM | POA: Diagnosis present

## 2015-02-27 HISTORY — DX: Systemic lupus erythematosus, unspecified: M32.9

## 2015-02-27 HISTORY — DX: Pneumonia, unspecified organism: J18.9

## 2015-02-27 LAB — URINALYSIS, ROUTINE W REFLEX MICROSCOPIC
Bilirubin Urine: NEGATIVE
GLUCOSE, UA: 250 mg/dL — AB
KETONES UR: NEGATIVE mg/dL
LEUKOCYTES UA: NEGATIVE
Nitrite: NEGATIVE
Protein, ur: 30 mg/dL — AB
Specific Gravity, Urine: 1.014 (ref 1.005–1.030)
UROBILINOGEN UA: 0.2 mg/dL (ref 0.0–1.0)
pH: 7.5 (ref 5.0–8.0)

## 2015-02-27 LAB — COMPREHENSIVE METABOLIC PANEL
ALBUMIN: 2.9 g/dL — AB (ref 3.5–5.0)
ALK PHOS: 67 U/L (ref 38–126)
ALT: 18 U/L (ref 14–54)
AST: 26 U/L (ref 15–41)
Anion gap: 7 (ref 5–15)
BILIRUBIN TOTAL: 0.2 mg/dL — AB (ref 0.3–1.2)
BUN: 6 mg/dL (ref 6–20)
CALCIUM: 8.4 mg/dL — AB (ref 8.9–10.3)
CHLORIDE: 105 mmol/L (ref 101–111)
CO2: 25 mmol/L (ref 22–32)
Creatinine, Ser: 0.93 mg/dL (ref 0.44–1.00)
GFR calc Af Amer: >60 mL/min (ref >60)
GFR calc non Af Amer: >60 mL/min (ref >60)
GLUCOSE: 171 mg/dL — AB (ref 65–99)
Potassium: 3.7 mmol/L (ref 3.5–5.1)
Sodium: 137 mmol/L (ref 135–145)
Total Protein: 6.7 g/dL (ref 6.5–8.1)

## 2015-02-27 LAB — CBC WITH DIFFERENTIAL/PLATELET
Basophils Absolute: 0 10*3/uL (ref 0.0–0.1)
Basophils Relative: 0 % (ref 0–1)
EOS ABS: 0.1 10*3/uL (ref 0.0–0.7)
EOS PCT: 1 % (ref 0–5)
HCT: 33.4 % — ABNORMAL LOW (ref 36.0–46.0)
HEMOGLOBIN: 10.5 g/dL — AB (ref 12.0–15.0)
LYMPHS ABS: 2.1 10*3/uL (ref 0.7–4.0)
Lymphocytes Relative: 22 % (ref 12–46)
MCH: 26.6 pg (ref 26.0–34.0)
MCHC: 31.4 g/dL (ref 30.0–36.0)
MCV: 84.8 fL (ref 78.0–100.0)
MONOS PCT: 8 % (ref 3–12)
Monocytes Absolute: 0.7 10*3/uL (ref 0.1–1.0)
Neutro Abs: 6.7 10*3/uL (ref 1.7–7.7)
Neutrophils Relative %: 69 % (ref 43–77)
Platelets: 293 10*3/uL (ref 150–400)
RBC: 3.94 MIL/uL (ref 3.87–5.11)
RDW: 15.3 % (ref 11.5–15.5)
WBC: 9.6 10*3/uL (ref 4.0–10.5)

## 2015-02-27 LAB — LIPASE, BLOOD: LIPASE: 28 U/L (ref 22–51)

## 2015-02-27 LAB — URINE MICROSCOPIC-ADD ON

## 2015-02-27 LAB — PREGNANCY, URINE: Preg Test, Ur: NEGATIVE

## 2015-02-27 MED ORDER — ONDANSETRON HCL 4 MG/2ML IJ SOLN
4.0000 mg | Freq: Once | INTRAMUSCULAR | Status: AC
  Administered 2015-02-27: 4 mg via INTRAVENOUS
  Filled 2015-02-27: qty 2

## 2015-02-27 MED ORDER — PANTOPRAZOLE SODIUM 40 MG IV SOLR
40.0000 mg | Freq: Every day | INTRAVENOUS | Status: DC
Start: 2015-02-28 — End: 2015-03-01
  Administered 2015-02-28 (×2): 40 mg via INTRAVENOUS
  Filled 2015-02-27 (×2): qty 40

## 2015-02-27 MED ORDER — FENTANYL CITRATE (PF) 100 MCG/2ML IJ SOLN
50.0000 ug | Freq: Once | INTRAMUSCULAR | Status: AC
  Administered 2015-02-27: 50 ug via INTRAVENOUS
  Filled 2015-02-27: qty 2

## 2015-02-27 MED ORDER — SODIUM CHLORIDE 0.9 % IV SOLN
1000.0000 mL | Freq: Once | INTRAVENOUS | Status: AC
  Administered 2015-02-27: 1000 mL via INTRAVENOUS

## 2015-02-27 MED ORDER — PROMETHAZINE HCL 25 MG/ML IJ SOLN
25.0000 mg | Freq: Once | INTRAMUSCULAR | Status: AC
  Administered 2015-02-27: 25 mg via INTRAVENOUS
  Filled 2015-02-27: qty 1

## 2015-02-27 MED ORDER — HYDRALAZINE HCL 20 MG/ML IJ SOLN
10.0000 mg | INTRAMUSCULAR | Status: DC | PRN
  Administered 2015-02-28 – 2015-03-04 (×7): 10 mg via INTRAVENOUS
  Filled 2015-02-27 (×8): qty 1

## 2015-02-27 MED ORDER — METOPROLOL TARTRATE 1 MG/ML IV SOLN
5.0000 mg | Freq: Four times a day (QID) | INTRAVENOUS | Status: DC
  Administered 2015-02-28 (×2): 5 mg via INTRAVENOUS
  Filled 2015-02-27 (×2): qty 5

## 2015-02-27 MED ORDER — POTASSIUM CHLORIDE IN NACL 20-0.9 MEQ/L-% IV SOLN
INTRAVENOUS | Status: DC
  Administered 2015-02-28 – 2015-03-01 (×3): via INTRAVENOUS
  Filled 2015-02-27 (×9): qty 1000

## 2015-02-27 MED ORDER — HYDROMORPHONE HCL 1 MG/ML IJ SOLN
1.0000 mg | Freq: Once | INTRAMUSCULAR | Status: AC
  Administered 2015-02-27: 1 mg via INTRAVENOUS
  Filled 2015-02-27: qty 1

## 2015-02-27 MED ORDER — HALOPERIDOL LACTATE 5 MG/ML IJ SOLN
5.0000 mg | Freq: Once | INTRAMUSCULAR | Status: AC
  Administered 2015-02-27: 5 mg via INTRAVENOUS
  Filled 2015-02-27: qty 1

## 2015-02-27 MED ORDER — FENTANYL CITRATE (PF) 100 MCG/2ML IJ SOLN
50.0000 ug | INTRAMUSCULAR | Status: DC | PRN
  Administered 2015-02-28 (×4): 50 ug via INTRAVENOUS
  Filled 2015-02-27 (×4): qty 2

## 2015-02-27 MED ORDER — ENOXAPARIN SODIUM 100 MG/ML ~~LOC~~ SOLN
90.0000 mg | Freq: Every day | SUBCUTANEOUS | Status: DC
  Administered 2015-02-28 – 2015-03-04 (×5): 90 mg via SUBCUTANEOUS
  Filled 2015-02-27 (×6): qty 1

## 2015-02-27 MED ORDER — SODIUM CHLORIDE 0.9 % IV SOLN
8.0000 mg | INTRAVENOUS | Status: DC | PRN
  Administered 2015-02-28 – 2015-03-02 (×8): 8 mg via INTRAVENOUS
  Filled 2015-02-27 (×15): qty 4

## 2015-02-27 NOTE — ED Notes (Signed)
Pt here via EMS with c/o uncontrollable n/v andepigastric painPt denies fever, dearrhea. Pt took phenergan at home PTA with no relief. EMS gave 4mg  zofran with no relief. Pt reports epigastric pain. 10/10.

## 2015-02-27 NOTE — H&P (Signed)
[facility] History and Physical  [name] XID:[id number] DOB: 01-Jan-1971 DOA: 02/27/2015  Referring physician: Theodosia Quay, MD PCP: Elyn Peers, MD   Chief Complaint: Abdominal pain, nausea and vomiting.  HPI: [name] is a 44 y.o. female with below past medical history who comes with a history of stabbing like abdominal pain that started 2-3 days ago. She says that this subsequently worsened, she then had nausea followed by vomiting, multiple episodes every day. She has a history of similar episodes in the past, but has not had one episode in quite a while. Apparently there was no fever, chills, diarrhea, flank pain or urinary symptoms. Symptoms get worse with oral intake and Phenergan tablets taken at home and Zofran given today by EMS had not help with her nausea and vomiting.  She is currently sleepy and only answering simple questions after she was last medicated. She stated that she is very tired due to lack of sleep over the past 2-3 days secondary to this illness. She is in no acute distress.  Review of Systems:  The patient was medicated and sleepy. She could not elaborate her answers. She stated that she hasn't slept well for the past several days and was too sleepy.  Past Medical History  Diagnosis Date  . Bell's palsy 08/09/2010  . SLE 08/09/2010  . SJOGREN'S SYNDROME 08/09/2010  . Hypertension   . High cholesterol   . Anginal pain   . Obstructive sleep apnea on CPAP 2011  . Anxiety   . GERD (gastroesophageal reflux disease)   . CHF (congestive heart failure)   . Acute renal failure      Intractable nausea vomiting secondary to diabetic gastroparesis causing dehydration and acute renal failure /notes 04/01/2013  . Liver mass     biopsied 03/2013 at [facility], not malignant.  is to undergo radiologic ablation of the mass in sept/October 2014.   Marland Kitchen Pneumonia 2000's    "once" (04/01/2013)  . On home oxygen therapy     "2L 24/7" (04/01/2013)  . Migraines    "maybe a couple times/yr" (04/01/2013)  . Stroke 2010; 10/2012    "left side is still weak from it, never fully regained full strength; no additions from stroke 10/2012"  . Obesity   . Family history of malignant neoplasm of breast   . Family history of malignant neoplasm of ovary   . Lupus   . Chest pain 01/13/2014  . Diabetic gastroparesis associated with type 2 diabetes mellitus     this is presumed diagnoses, not confirmed by any studies.   . Shortness of breath    Past Surgical History  Procedure Laterality Date  . Ectopic pregnancy surgery  1999  . Cardiac catheterization  02/07/12  . Hernia repair  2902'X    umbilical  . Epidermoid cyst excision  2007     left breast  . Esophagogastroduodenoscopy N/A 02/26/2013    Procedure: ESOPHAGOGASTRODUODENOSCOPY (EGD);  Surgeon: Irene Shipper, MD;  Location: [facility] ENDOSCOPY;  Service: Endoscopy;  Laterality: N/A;  . Breast cyst excision Left 2007    epidermoid  . Left and right heart catheterization with coronary angiogram N/A 02/07/2012    Procedure: LEFT AND RIGHT HEART CATHETERIZATION WITH CORONARY ANGIOGRAM;  Surgeon: Laverda Page, MD;  Location: [facility] CATH LAB;  Service: Cardiovascular;  Laterality: N/A;   Social History:  reports that she quit smoking about 24 years ago. Her smoking use included Cigarettes. She quit after 3 years of use. She has never  used smokeless tobacco. She reports that she does not drink alcohol or use illicit drugs.  Allergies  Allergen Reactions  . Metoclopramide Other (See Comments)    Developed restless leg, akathisia type limb movements.   . Oxycodone-Acetaminophen Hives and Rash    FYI: no reaction to hydrocodone; tolerates dilaudid  . Codeine Itching, Rash and Other (See Comments)    (Derm)    Family History  Problem Relation Age of Onset  . Diabetes Mother   . Hypertension Other   . Stroke Other   . Arthritis Other   . Ovarian cancer Sister 24    maternal half-sister; RAD51C positive  . Breast  cancer Maternal Aunt 92    currently 40  . Stomach cancer Maternal Uncle     dx 81s; deceased  . Cancer Paternal Aunt     unk. primary; deceased 75s  . Prostate cancer Paternal Uncle     deceased 4s  . Breast cancer Cousin     deceased 80s; daughter of mat uncle with stomach ca  . Prostate cancer Maternal Uncle     deceased 40s  . Breast cancer Maternal Aunt     deceased 88s  . Cancer Maternal Aunt     unk. primary  . Cancer Maternal Aunt     unk. primary  . Ovarian cancer Maternal Aunt     deceased 69s    Prior to Admission medications   Medication Sig Start Date End Date Taking? Authorizing Provider  Albiglutide (TANZEUM) 30 MG PEN Inject 30 mg into the skin once a week. Sundays    Historical Provider, MD  albuterol (PROAIR HFA) 108 (90 BASE) MCG/ACT inhaler Inhale 2 puffs into the lungs every 6 (six) hours as needed for wheezing or shortness of breath.     Historical Provider, MD  albuterol (PROVENTIL) (5 MG/ML) 0.5% nebulizer solution Take 2.5 mg by nebulization every 6 (six) hours as needed for wheezing or shortness of breath.  06/24/14   Historical Provider, MD  alendronate (FOSAMAX) 70 MG tablet Take 70 mg by mouth every Saturday. Take with a full glass of water on an empty stomach.    Historical Provider, MD  aspirin EC 81 MG tablet Take 81 mg by mouth daily.    Historical Provider, MD  azaTHIOprine (IMURAN) 50 MG tablet Take 100 mg by mouth 2 (two) times daily.     Historical Provider, MD  carvedilol (COREG) 3.125 MG tablet Take 3.125 mg by mouth 2 (two) times daily with a meal.    Historical Provider, MD  cetirizine (ZYRTEC) 10 MG tablet Take 10 mg by mouth daily.    Historical Provider, MD  citalopram (CELEXA) 10 MG tablet Take 1 tablet (10 mg total) by mouth every evening. 04/06/13   Belkys A Regalado, MD  clopidogrel (PLAVIX) 75 MG tablet Take 75 mg by mouth daily.    Historical Provider, MD  cycloSPORINE (RESTASIS) 0.05 % ophthalmic emulsion Place 1 drop into both eyes  3 (three) times daily.    Historical Provider, MD  famotidine (PEPCID) 20 MG tablet Take 20 mg by mouth 2 (two) times daily.    Historical Provider, MD  hydroxychloroquine (PLAQUENIL) 200 MG tablet Take 200 mg by mouth 2 (two) times daily.     Historical Provider, MD  insulin regular human CONCENTRATED (HUMULIN R) 500 UNIT/ML SOLN injection INJECT 0.12 MLS (60 UNITS TOTAL) INTO THE SKIN 3 (THREE) TIMES DAILY WITH MEALS. 12/12/14   Renato Shin, MD  isosorbide-hydrALAZINE (BIDIL) 20-37.5 MG  per tablet Take 1 tablet by mouth 3 (three) times daily.    Historical Provider, MD  LORazepam (ATIVAN) 1 MG tablet Take 1 tablet (1 mg total) by mouth every 6 (six) hours as needed for anxiety. 11/22/14   Montine Circle, PA-C  losartan (COZAAR) 100 MG tablet Take 100 mg by mouth daily. 06/18/14   Historical Provider, MD  metFORMIN (GLUCOPHAGE-XR) 500 MG 24 hr tablet Take 1,000 mg by mouth 2 (two) times daily.    Historical Provider, MD  morphine (MSIR) 15 MG tablet Take 30 mg by mouth every evening.     Historical Provider, MD  Polyvinyl Alcohol-Povidone (REFRESH OP) Place 1 drop into both eyes 2 (two) times daily.    Historical Provider, MD  potassium chloride (MICRO-K) 10 MEQ CR capsule Take 10 mEq by mouth 2 (two) times daily.  08/21/14   Historical Provider, MD  pregabalin (LYRICA) 75 MG capsule Take 75 mg by mouth 3 (three) times daily.    Historical Provider, MD  torsemide (DEMADEX) 20 MG tablet Take 1 tablet (20 mg total) by mouth 2 (two) times daily. 04/06/13   Elmarie Shiley, MD   Physical Exam: Filed Vitals:   02/27/15 1930 02/27/15 2000 02/27/15 2102 02/27/15 2200  BP: 173/81 156/102 169/91 194/85  Pulse: 82 91 88 93  Temp:      TempSrc:      Resp:  19  16  Height:      Weight:      SpO2: 100% 100% 100% 100%    Wt Readings from Last 3 Encounters:  02/27/15 183.707 kg (405 lb)  01/26/15 180.9 kg (398 lb 13 oz)  11/21/14 181.977 kg (401 lb 3 oz)    General:  Appears calm and  comfortable Eyes: PERRL, normal lids, irises & conjunctiva ENT: grossly normal hearing, lips & tongue Neck: no LAD, masses or thyromegaly Cardiovascular: RRR, no m/r/g. No LE edema. Telemetry: SR, no arrhythmias  Respiratory: CTA bilaterally anterior exam, no w/r/r. Normal respiratory effort. Abdomen: Obese, soft, positive epigastric tenderness without guarding or rebound tenderness. Skin: no rash or induration seen on limited exam Musculoskeletal: grossly normal tone BUE/BLE Psychiatric: The patient is medicated. Neurologic: grossly non-focal.          Labs on Admission:  Basic Metabolic Panel:  Recent Labs Lab 02/27/15 1746  NA 137  K 3.7  CL 105  CO2 25  GLUCOSE 171*  BUN 6  CREATININE 0.93  CALCIUM 8.4*   Liver Function Tests:  Recent Labs Lab 02/27/15 1746  AST 26  ALT 18  ALKPHOS 67  BILITOT 0.2*  PROT 6.7  ALBUMIN 2.9*    Recent Labs Lab 02/27/15 1746  LIPASE 28   No results for input(s): AMMONIA in the last 168 hours. CBC:  Recent Labs Lab 02/27/15 1746  WBC 9.6  NEUTROABS 6.7  HGB 10.5*  HCT 33.4*  MCV 84.8  PLT 293   Cardiac Enzymes: No results for input(s): CKTOTAL, CKMB, CKMBINDEX, TROPONINI in the last 168 hours.  BNP (last 3 results)  Recent Labs  07/27/14 1646  BNP 111.6*    ProBNP (last 3 results) No results for input(s): PROBNP in the last 8760 hours.  CBG: No results for input(s): GLUCAP in the last 168 hours.  Radiological Exams on Admission: US Abdomen Limited Ruq  02/27/2015   CLINICAL DATA:  Hypertension.  Nausea and vomiting  EXAM: US ABDOMEN LIMITED - RIGHT UPPER QUADRANT  COMPARISON:  CT 04/27/2014  FINDINGS: Gallbladder:  No  gallstones or wall thickening visualized. Patient patient heavily medicated. Sonographic Murphy's sign could not be assessed.  Common bile duct:  Diameter: 4 mm  Liver:  No focal lesion identified. Within normal limits in parenchymal echogenicity.  IMPRESSION: No evidence of cholecystitis.    Electronically Signed   By: Suzy Bouchard M.D.   On: 02/27/2015 20:48    EKG: Independently reviewed.  Vent. rate 88 BPM PR interval 162 ms QRS duration 96 ms QT/QTc 423/512 ms P-R-T axes 67 59 7  Assessment/Plan Principal Problem:   Intractable nausea and vomiting Active Problems:   SJOGREN'S SYNDROME   Anemia   HTN (hypertension)   Morbid obesity   Lupus   Intractable pain   Admit for gentle IV fluid hydration, analgesic and antiemetic treatment. Follow-up labs in a.m. Resume oral medications when the patient is tolerating P O intake. IV metoprolol and IV hydralazine IV hydralazine for hypertension. Consider further workup if no improvement.   Code Status: Full code. DVT Prophylaxis: Lovenox SQ. Family Communication:         Lalitha, Ilyas Sister [phone]     [facility] Mother [phone]    Disposition Plan: Home with outpatient follow-up.  Time spent: 60 minutes.  Reubin Milan [facility] Pager [phone]

## 2015-02-27 NOTE — ED Provider Notes (Signed)
CSN: [id number]     Arrival date & time 02/27/15  1637 History   First MD Initiated Contact with Patient 02/27/15 1639     Chief Complaint  Patient presents with  . Emesis  . Abdominal Pain     (Consider location/radiation/quality/duration/timing/severity/associated sxs/prior Treatment) HPI Patient is a 44 y.o. female who presents today with abdominal pain that started approximately 2-3 days ago. Patient states that she also developed nausea 2-3 days ago which is transpired into vomiting. She states that she's had multiple episodes of nausea and vomiting over the last 2-3 days. She states that she's had similar episodes in the past but it's been quite some time since she's had it. She states that when she throws up she also has pain in her chest but she does not have pain whenever she is not throwing up. She denies any recent fevers chills diarrhea hematuria or dysuria. Patient does notably have lupus and stroke runs syndrome. She states that she is followed by rheumatology at [facility]. She does relate that her abdominal pain is in the epigastric region and that it worsens with moving around and also with palpation. Lying still seems to make it better. Patient states that she tried Phenergan at home and this did not help as well. Additionally she was given Zofran in route via EMS and she states that this hasn't helped as well.  She describes the pain as sharp stabbing in quality. She states that this pain is constant and seems to be worsening. She states that this pain does not radiate anywhere. She does relate that she has a history of congestive heart failure but otherwise has no history of myocardial infarction.    Past Medical History  Diagnosis Date  . Bell's palsy 08/09/2010  . SLE 08/09/2010  . SJOGREN'S SYNDROME 08/09/2010  . Hypertension   . High cholesterol   . Anginal pain   . Obstructive sleep apnea on CPAP 2011  . Anxiety   . GERD (gastroesophageal reflux disease)   .  CHF (congestive heart failure)   . Acute renal failure      Intractable nausea vomiting secondary to diabetic gastroparesis causing dehydration and acute renal failure /notes 04/01/2013  . Liver mass     biopsied 03/2013 at [facility], not malignant.  is to undergo radiologic ablation of the mass in sept/October 2014.   Marland Kitchen Pneumonia 2000's    "once" (04/01/2013)  . On home oxygen therapy     "2L 24/7" (04/01/2013)  . Migraines     "maybe a couple times/yr" (04/01/2013)  . Stroke 2010; 10/2012    "left side is still weak from it, never fully regained full strength; no additions from stroke 10/2012"  . Obesity   . Family history of malignant neoplasm of breast   . Family history of malignant neoplasm of ovary   . Lupus   . Chest pain 01/13/2014  . Diabetic gastroparesis associated with type 2 diabetes mellitus     this is presumed diagnoses, not confirmed by any studies.   . Shortness of breath    Past Surgical History  Procedure Laterality Date  . Ectopic pregnancy surgery  1999  . Cardiac catheterization  02/07/12  . Hernia repair  4920'F    umbilical  . Epidermoid cyst excision  2007     left breast  . Esophagogastroduodenoscopy N/A 02/26/2013    Procedure: ESOPHAGOGASTRODUODENOSCOPY (EGD);  Surgeon: Irene Shipper, MD;  Location: [facility] ENDOSCOPY;  Service: Endoscopy;  Laterality:  N/A;  . Breast cyst excision Left 2007    epidermoid  . Left and right heart catheterization with coronary angiogram N/A 02/07/2012    Procedure: LEFT AND RIGHT HEART CATHETERIZATION WITH CORONARY ANGIOGRAM;  Surgeon: Laverda Page, MD;  Location: [facility] CATH LAB;  Service: Cardiovascular;  Laterality: N/A;   Family History  Problem Relation Age of Onset  . Diabetes Mother   . Hypertension Other   . Stroke Other   . Arthritis Other   . Ovarian cancer Sister 53    maternal half-sister; RAD51C positive  . Breast cancer Maternal Aunt 62    currently 60  . Stomach cancer Maternal Uncle     dx 75s; deceased  .  Cancer Paternal Aunt     unk. primary; deceased 24s  . Prostate cancer Paternal Uncle     deceased 66s  . Breast cancer Cousin     deceased 73s; daughter of mat uncle with stomach ca  . Prostate cancer Maternal Uncle     deceased 82s  . Breast cancer Maternal Aunt     deceased 31s  . Cancer Maternal Aunt     unk. primary  . Cancer Maternal Aunt     unk. primary  . Ovarian cancer Maternal Aunt     deceased 33s   History  Substance Use Topics  . Smoking status: Former Smoker -- 3 years    Types: Cigarettes    Quit date: 06/01/1990  . Smokeless tobacco: Never Used  . Alcohol Use: No   OB History    Gravida Para Term Preterm AB TAB SAB Ectopic Multiple Living   2 1 1  1   1  1      Review of Systems  Constitutional: Negative for fever and chills.  HENT: Negative for congestion and rhinorrhea.   Respiratory: Positive for shortness of breath.   Cardiovascular: Positive for chest pain (States chest pain mainly whenever she is actively vomiting).  Gastrointestinal: Positive for nausea, vomiting (worsening today) and abdominal pain (Epigastric pain starting 2-3 days ago). Negative for diarrhea, blood in stool (Denies any blood in her stool) and abdominal distention.  Genitourinary: Negative for dysuria, hematuria and difficulty urinating.  Skin: Negative for color change and rash.  Neurological: Negative for dizziness and light-headedness.  Psychiatric/Behavioral: Negative for confusion and agitation.  All other systems reviewed and are negative.     Allergies  Metoclopramide; Oxycodone-acetaminophen; and Codeine  Home Medications   Prior to Admission medications   Medication Sig Start Date End Date Taking? Authorizing Provider  Albiglutide (TANZEUM) 30 MG PEN Inject 30 mg into the skin once a week. Sundays    Historical Provider, MD  albuterol (PROAIR HFA) 108 (90 BASE) MCG/ACT inhaler Inhale 2 puffs into the lungs every 6 (six) hours as needed for wheezing or shortness of  breath.     Historical Provider, MD  albuterol (PROVENTIL) (5 MG/ML) 0.5% nebulizer solution Take 2.5 mg by nebulization every 6 (six) hours as needed for wheezing or shortness of breath.  06/24/14   Historical Provider, MD  alendronate (FOSAMAX) 70 MG tablet Take 70 mg by mouth every Saturday. Take with a full glass of water on an empty stomach.    Historical Provider, MD  aspirin EC 81 MG tablet Take 81 mg by mouth daily.    Historical Provider, MD  azaTHIOprine (IMURAN) 50 MG tablet Take 100 mg by mouth 2 (two) times daily.     Historical Provider, MD  carvedilol (COREG) 3.125 MG  tablet Take 3.125 mg by mouth 2 (two) times daily with a meal.    Historical Provider, MD  cetirizine (ZYRTEC) 10 MG tablet Take 10 mg by mouth daily.    Historical Provider, MD  citalopram (CELEXA) 10 MG tablet Take 1 tablet (10 mg total) by mouth every evening. 04/06/13   Belkys A Regalado, MD  clopidogrel (PLAVIX) 75 MG tablet Take 75 mg by mouth daily.    Historical Provider, MD  cycloSPORINE (RESTASIS) 0.05 % ophthalmic emulsion Place 1 drop into both eyes 3 (three) times daily.    Historical Provider, MD  famotidine (PEPCID) 20 MG tablet Take 20 mg by mouth 2 (two) times daily.    Historical Provider, MD  hydroxychloroquine (PLAQUENIL) 200 MG tablet Take 200 mg by mouth 2 (two) times daily.     Historical Provider, MD  insulin regular human CONCENTRATED (HUMULIN R) 500 UNIT/ML SOLN injection INJECT 0.12 MLS (60 UNITS TOTAL) INTO THE SKIN 3 (THREE) TIMES DAILY WITH MEALS. 12/12/14   Renato Shin, MD  isosorbide-hydrALAZINE (BIDIL) 20-37.5 MG per tablet Take 1 tablet by mouth 3 (three) times daily.    Historical Provider, MD  LORazepam (ATIVAN) 1 MG tablet Take 1 tablet (1 mg total) by mouth every 6 (six) hours as needed for anxiety. 11/22/14   Montine Circle, PA-C  losartan (COZAAR) 100 MG tablet Take 100 mg by mouth daily. 06/18/14   Historical Provider, MD  metFORMIN (GLUCOPHAGE-XR) 500 MG 24 hr tablet Take 1,000 mg  by mouth 2 (two) times daily.    Historical Provider, MD  morphine (MSIR) 15 MG tablet Take 30 mg by mouth every evening.     Historical Provider, MD  Polyvinyl Alcohol-Povidone (REFRESH OP) Place 1 drop into both eyes 2 (two) times daily.    Historical Provider, MD  potassium chloride (MICRO-K) 10 MEQ CR capsule Take 10 mEq by mouth 2 (two) times daily.  08/21/14   Historical Provider, MD  pregabalin (LYRICA) 75 MG capsule Take 75 mg by mouth 3 (three) times daily.    Historical Provider, MD  torsemide (DEMADEX) 20 MG tablet Take 1 tablet (20 mg total) by mouth 2 (two) times daily. 04/06/13   Belkys A Regalado, MD   BP 191/94 mmHg  Pulse 85  Temp(Src) 98.1 F (36.7 C) (Oral)  Resp 23  Ht 5' 8"  (1.727 m)  Wt 405 lb (183.707 kg)  BMI 61.59 kg/m2  SpO2 100%  LMP  Physical Exam  Constitutional: She is oriented to person, place, and time. She appears distressed (secondary to vomiting).  HENT:  Head: Normocephalic and atraumatic.  Eyes: Conjunctivae and EOM are normal.  Neck: Normal range of motion. Neck supple.  Cardiovascular: Normal rate.  Exam reveals no gallop and no friction rub.   No murmur heard. Pulmonary/Chest: Effort normal. No respiratory distress. She has no wheezes. She has no rales.  Abdominal: Soft. She exhibits no mass. There is tenderness (Epigastric tenderness.). There is no guarding.  Musculoskeletal: Normal range of motion. She exhibits no tenderness.  Neurological: She is alert and oriented to person, place, and time.  Skin: Skin is warm and dry. She is not diaphoretic.  Nursing note and vitals reviewed.   ED Course  Procedures (including critical care time) Labs Review Labs Reviewed  COMPREHENSIVE METABOLIC PANEL - Abnormal; Notable for the following:    Glucose, Bld 171 (*)    Calcium 8.4 (*)    Albumin 2.9 (*)    Total Bilirubin 0.2 (*)    All  other components within normal limits  CBC WITH DIFFERENTIAL/PLATELET - Abnormal; Notable for the following:     Hemoglobin 10.5 (*)    HCT 33.4 (*)    All other components within normal limits  URINALYSIS, ROUTINE W REFLEX MICROSCOPIC (NOT AT [facility]) - Abnormal; Notable for the following:    Glucose, UA 250 (*)    Hgb urine dipstick SMALL (*)    Protein, ur 30 (*)    All other components within normal limits  LIPASE, BLOOD  PREGNANCY, URINE  URINE MICROSCOPIC-ADD ON    Imaging Review US Abdomen Limited Ruq  02/27/2015   CLINICAL DATA:  Hypertension.  Nausea and vomiting  EXAM: US ABDOMEN LIMITED - RIGHT UPPER QUADRANT  COMPARISON:  CT 04/27/2014  FINDINGS: Gallbladder:  No gallstones or wall thickening visualized. Patient patient heavily medicated. Sonographic Murphy's sign could not be assessed.  Common bile duct:  Diameter: 4 mm  Liver:  No focal lesion identified. Within normal limits in parenchymal echogenicity.  IMPRESSION: No evidence of cholecystitis.   Electronically Signed   By: Suzy Bouchard M.D.   On: 02/27/2015 20:48     EKG Interpretation   Date/Time:  Friday February 27 2015 17:41:52 EDT Ventricular Rate:  88 PR Interval:  162 QRS Duration: 96 QT Interval:  423 QTC Calculation: 512 R Axis:   59 Text Interpretation:  Sinus rhythm Borderline T wave abnormalities  Prolonged QT interval since last tracing no significant change Confirmed  by Sabra Heck  MD, BRIAN ([id number]) on 02/27/2015 6:15:07 PM      MDM   Final diagnoses:  Epigastric pain  Intractable vomiting with nausea, vomiting of unspecified type  History of lupus  History of Sjogren's disease    Patient is a 44 year old female with past medical history noted that he presents today with epigastric pain and uncontrollable nausea and vomiting started approximately 4 days ago. On exam patient does have a moderately tender abdomen without any guarding or rebound. Patient is not in extremis. Review of vital signs showed patient is afebrile although hypertensive at times which could be secondary to pain. She is notably having  episodes of vomiting here in the department. Initially tried a dose of Zofran Dilaudid for nausea and pain. On reevaluation the patient stated that her pain had improved however she remained nauseated. Ordered Phenergan at that point to continue to try to control the patient's nausea vomiting no reevaluation this too did not improve the patient's symptoms. Finally at this point tried Haldol and consulted with the hospitalist service for admission for intractable nausea and vomiting. Hospitalist will plan to admit for further management of patient's symptoms. Patient aware of this and agreeable with this plan. Patient was stable at time of transfer.    Theodosia Quay, MD 02/27/15 [id number]  Noemi Chapel, MD 02/27/15 [phone]

## 2015-02-27 NOTE — ED Provider Notes (Signed)
44 year old female who is morbidly obese. She has a history of hypertension, high cholesterol, obstructive sleep apnea, acid reflux and congestive heart failure. She complains of bilateral upper abdominal discomfort and tenderness with associated nausea and vomiting. This has been persistent for 24 hours. She has no diarrhea or fevers. On exam the patient is morbidly obese, she has mild tenderness to palpation across the upper abdomen, no tenderness or guarding across the lower abdomen, heart and lungs are normal, no murmurs rubs or gallops, clear lung sounds.  Labs are unremarkable except for a mild elevation in blood sugar. We'll obtain an ultrasound to evaluate the gallbladder. Possibly acid reflux, possibly pancreatitis, no lower abdominal tenderness to suggest a appendicitis as a source.  I saw and evaluated the patient, reviewed the resident's note and I agree with the findings and plan.  I personally interpreted the EKG as well as the resident and agree with the interpretation on the resident's chart.   EKG Interpretation  Date/Time:  Friday February 27 2015 17:41:52 EDT Ventricular Rate:  88 PR Interval:  162 QRS Duration: 96 QT Interval:  423 QTC Calculation: 512 R Axis:   59 Text Interpretation:  Sinus rhythm Borderline T wave abnormalities Prolonged QT interval since last tracing no significant change Confirmed by Debbie Yearick  MD, Destony Prevost ([id number]) on 02/27/2015 6:15:07 PM        Final diagnoses:  Epigastric pain  Intractable vomiting with nausea, vomiting of unspecified type  History of lupus  History of Sjogren's disease          Noemi Chapel, MD 02/27/15 2340

## 2015-02-28 ENCOUNTER — Observation Stay (HOSPITAL_COMMUNITY): Payer: Medicaid Other

## 2015-02-28 ENCOUNTER — Encounter (HOSPITAL_COMMUNITY): Payer: Self-pay | Admitting: General Practice

## 2015-02-28 DIAGNOSIS — Z9981 Dependence on supplemental oxygen: Secondary | ICD-10-CM | POA: Diagnosis not present

## 2015-02-28 DIAGNOSIS — R1013 Epigastric pain: Secondary | ICD-10-CM | POA: Diagnosis present

## 2015-02-28 DIAGNOSIS — K210 Gastro-esophageal reflux disease with esophagitis: Secondary | ICD-10-CM | POA: Diagnosis present

## 2015-02-28 DIAGNOSIS — D509 Iron deficiency anemia, unspecified: Secondary | ICD-10-CM

## 2015-02-28 DIAGNOSIS — Z79899 Other long term (current) drug therapy: Secondary | ICD-10-CM | POA: Diagnosis not present

## 2015-02-28 DIAGNOSIS — Z8673 Personal history of transient ischemic attack (TIA), and cerebral infarction without residual deficits: Secondary | ICD-10-CM | POA: Diagnosis not present

## 2015-02-28 DIAGNOSIS — Z862 Personal history of diseases of the blood and blood-forming organs and certain disorders involving the immune mechanism: Secondary | ICD-10-CM | POA: Diagnosis not present

## 2015-02-28 DIAGNOSIS — F419 Anxiety disorder, unspecified: Secondary | ICD-10-CM | POA: Diagnosis present

## 2015-02-28 DIAGNOSIS — G4733 Obstructive sleep apnea (adult) (pediatric): Secondary | ICD-10-CM | POA: Diagnosis present

## 2015-02-28 DIAGNOSIS — M3500 Sicca syndrome, unspecified: Secondary | ICD-10-CM | POA: Diagnosis present

## 2015-02-28 DIAGNOSIS — Z6841 Body Mass Index (BMI) 40.0 and over, adult: Secondary | ICD-10-CM | POA: Diagnosis not present

## 2015-02-28 DIAGNOSIS — Z7952 Long term (current) use of systemic steroids: Secondary | ICD-10-CM | POA: Diagnosis not present

## 2015-02-28 DIAGNOSIS — E1143 Type 2 diabetes mellitus with diabetic autonomic (poly)neuropathy: Secondary | ICD-10-CM | POA: Diagnosis present

## 2015-02-28 DIAGNOSIS — M329 Systemic lupus erythematosus, unspecified: Secondary | ICD-10-CM

## 2015-02-28 DIAGNOSIS — G894 Chronic pain syndrome: Secondary | ICD-10-CM | POA: Diagnosis present

## 2015-02-28 DIAGNOSIS — R1110 Vomiting, unspecified: Secondary | ICD-10-CM | POA: Diagnosis not present

## 2015-02-28 DIAGNOSIS — Z87891 Personal history of nicotine dependence: Secondary | ICD-10-CM | POA: Diagnosis not present

## 2015-02-28 DIAGNOSIS — G43A1 Cyclical vomiting, intractable: Secondary | ICD-10-CM | POA: Diagnosis not present

## 2015-02-28 DIAGNOSIS — K219 Gastro-esophageal reflux disease without esophagitis: Secondary | ICD-10-CM | POA: Diagnosis present

## 2015-02-28 DIAGNOSIS — E780 Pure hypercholesterolemia: Secondary | ICD-10-CM | POA: Diagnosis present

## 2015-02-28 DIAGNOSIS — I10 Essential (primary) hypertension: Secondary | ICD-10-CM | POA: Diagnosis present

## 2015-02-28 DIAGNOSIS — Z7902 Long term (current) use of antithrombotics/antiplatelets: Secondary | ICD-10-CM | POA: Diagnosis not present

## 2015-02-28 DIAGNOSIS — Z8739 Personal history of other diseases of the musculoskeletal system and connective tissue: Secondary | ICD-10-CM | POA: Diagnosis not present

## 2015-02-28 DIAGNOSIS — D649 Anemia, unspecified: Secondary | ICD-10-CM | POA: Diagnosis present

## 2015-02-28 DIAGNOSIS — G44221 Chronic tension-type headache, intractable: Secondary | ICD-10-CM | POA: Diagnosis not present

## 2015-02-28 DIAGNOSIS — Z794 Long term (current) use of insulin: Secondary | ICD-10-CM | POA: Diagnosis not present

## 2015-02-28 DIAGNOSIS — R52 Pain, unspecified: Secondary | ICD-10-CM | POA: Diagnosis not present

## 2015-02-28 DIAGNOSIS — K3184 Gastroparesis: Secondary | ICD-10-CM | POA: Diagnosis present

## 2015-02-28 DIAGNOSIS — Z7982 Long term (current) use of aspirin: Secondary | ICD-10-CM | POA: Diagnosis not present

## 2015-02-28 DIAGNOSIS — Z79891 Long term (current) use of opiate analgesic: Secondary | ICD-10-CM | POA: Diagnosis not present

## 2015-02-28 DIAGNOSIS — Z7983 Long term (current) use of bisphosphonates: Secondary | ICD-10-CM | POA: Diagnosis not present

## 2015-02-28 DIAGNOSIS — I5032 Chronic diastolic (congestive) heart failure: Secondary | ICD-10-CM | POA: Diagnosis present

## 2015-02-28 LAB — COMPREHENSIVE METABOLIC PANEL
ALBUMIN: 3 g/dL — AB (ref 3.5–5.0)
ALK PHOS: 76 U/L (ref 38–126)
ALT: 59 U/L — ABNORMAL HIGH (ref 14–54)
AST: 104 U/L — AB (ref 15–41)
Anion gap: 10 (ref 5–15)
BUN: 6 mg/dL (ref 6–20)
CALCIUM: 8.4 mg/dL — AB (ref 8.9–10.3)
CO2: 23 mmol/L (ref 22–32)
Chloride: 102 mmol/L (ref 101–111)
Creatinine, Ser: 0.81 mg/dL (ref 0.44–1.00)
GFR calc Af Amer: >60 mL/min (ref >60)
GFR calc non Af Amer: >60 mL/min (ref >60)
GLUCOSE: 174 mg/dL — AB (ref 65–99)
POTASSIUM: 3.8 mmol/L (ref 3.5–5.1)
SODIUM: 135 mmol/L (ref 135–145)
Total Bilirubin: 0.5 mg/dL (ref 0.3–1.2)
Total Protein: 7.1 g/dL (ref 6.5–8.1)

## 2015-02-28 LAB — CBC WITH DIFFERENTIAL/PLATELET
BASOS ABS: 0 10*3/uL (ref 0.0–0.1)
Basophils Relative: 0 % (ref 0–1)
EOS ABS: 0 10*3/uL (ref 0.0–0.7)
EOS PCT: 0 % (ref 0–5)
HEMATOCRIT: 34.5 % — AB (ref 36.0–46.0)
Hemoglobin: 10.9 g/dL — ABNORMAL LOW (ref 12.0–15.0)
Lymphocytes Relative: 11 % — ABNORMAL LOW (ref 12–46)
Lymphs Abs: 1.5 10*3/uL (ref 0.7–4.0)
MCH: 26.1 pg (ref 26.0–34.0)
MCHC: 31.6 g/dL (ref 30.0–36.0)
MCV: 82.7 fL (ref 78.0–100.0)
MONO ABS: 0.8 10*3/uL (ref 0.1–1.0)
Monocytes Relative: 6 % (ref 3–12)
Neutro Abs: 10.6 10*3/uL — ABNORMAL HIGH (ref 1.7–7.7)
Neutrophils Relative %: 83 % — ABNORMAL HIGH (ref 43–77)
Platelets: 314 10*3/uL (ref 150–400)
RBC: 4.17 MIL/uL (ref 3.87–5.11)
RDW: 15.1 % (ref 11.5–15.5)
WBC: 12.8 10*3/uL — ABNORMAL HIGH (ref 4.0–10.5)

## 2015-02-28 LAB — GLUCOSE, CAPILLARY
Glucose-Capillary: 174 mg/dL — ABNORMAL HIGH (ref 65–99)
Glucose-Capillary: 137 mg/dL — ABNORMAL HIGH (ref 65–99)
Glucose-Capillary: 141 mg/dL — ABNORMAL HIGH (ref 65–99)

## 2015-02-28 MED ORDER — CYCLOSPORINE 0.05 % OP EMUL
1.0000 [drp] | Freq: Three times a day (TID) | OPHTHALMIC | Status: DC
  Administered 2015-02-28 – 2015-03-04 (×10): 1 [drp] via OPHTHALMIC
  Filled 2015-02-28 (×19): qty 1

## 2015-02-28 MED ORDER — FENTANYL CITRATE (PF) 100 MCG/2ML IJ SOLN
100.0000 ug | Freq: Once | INTRAMUSCULAR | Status: AC
  Administered 2015-02-28: 100 ug via INTRAVENOUS
  Filled 2015-02-28: qty 2

## 2015-02-28 MED ORDER — KETOROLAC TROMETHAMINE 30 MG/ML IJ SOLN
30.0000 mg | Freq: Once | INTRAMUSCULAR | Status: AC
  Administered 2015-02-28: 30 mg via INTRAVENOUS
  Filled 2015-02-28: qty 1

## 2015-02-28 MED ORDER — AZATHIOPRINE 50 MG PO TABS
100.0000 mg | ORAL_TABLET | Freq: Two times a day (BID) | ORAL | Status: DC
  Administered 2015-02-28 – 2015-03-04 (×5): 100 mg via ORAL
  Filled 2015-02-28 (×8): qty 2

## 2015-02-28 MED ORDER — HYDROXYCHLOROQUINE SULFATE 200 MG PO TABS
200.0000 mg | ORAL_TABLET | Freq: Two times a day (BID) | ORAL | Status: DC
Start: 2015-02-28 — End: 2015-03-05
  Administered 2015-02-28 – 2015-03-04 (×6): 200 mg via ORAL
  Filled 2015-02-28 (×8): qty 1

## 2015-02-28 MED ORDER — CITALOPRAM HYDROBROMIDE 20 MG PO TABS
10.0000 mg | ORAL_TABLET | Freq: Every evening | ORAL | Status: DC
  Administered 2015-03-02 – 2015-03-03 (×2): 10 mg via ORAL
  Filled 2015-02-28 (×4): qty 1

## 2015-02-28 MED ORDER — CLOPIDOGREL BISULFATE 75 MG PO TABS
75.0000 mg | ORAL_TABLET | Freq: Every day | ORAL | Status: DC
  Administered 2015-02-28 – 2015-03-04 (×4): 75 mg via ORAL
  Filled 2015-02-28 (×6): qty 1

## 2015-02-28 MED ORDER — PROMETHAZINE HCL 25 MG/ML IJ SOLN
12.5000 mg | INTRAMUSCULAR | Status: DC | PRN
  Administered 2015-02-28 – 2015-03-01 (×5): 12.5 mg via INTRAVENOUS
  Filled 2015-02-28 (×6): qty 1

## 2015-02-28 MED ORDER — INSULIN ASPART 100 UNIT/ML ~~LOC~~ SOLN
0.0000 [IU] | SUBCUTANEOUS | Status: DC
  Administered 2015-02-28 (×2): 1 [IU] via SUBCUTANEOUS
  Administered 2015-02-28 – 2015-03-01 (×2): 2 [IU] via SUBCUTANEOUS
  Administered 2015-03-01: 1 [IU] via SUBCUTANEOUS
  Administered 2015-03-01: 2 [IU] via SUBCUTANEOUS
  Administered 2015-03-01 (×2): 1 [IU] via SUBCUTANEOUS
  Administered 2015-03-01 – 2015-03-02 (×3): 2 [IU] via SUBCUTANEOUS
  Administered 2015-03-02: 1 [IU] via SUBCUTANEOUS
  Administered 2015-03-02 (×2): 2 [IU] via SUBCUTANEOUS
  Administered 2015-03-03: 1 [IU] via SUBCUTANEOUS
  Administered 2015-03-03: 2 [IU] via SUBCUTANEOUS
  Administered 2015-03-03 (×3): 1 [IU] via SUBCUTANEOUS
  Administered 2015-03-03 – 2015-03-04 (×2): 2 [IU] via SUBCUTANEOUS
  Administered 2015-03-04: 5 [IU] via SUBCUTANEOUS
  Administered 2015-03-04 (×2): 2 [IU] via SUBCUTANEOUS
  Administered 2015-03-05 (×2): 3 [IU] via SUBCUTANEOUS
  Filled 2015-02-28: qty 1

## 2015-02-28 MED ORDER — CARVEDILOL 3.125 MG PO TABS
3.1250 mg | ORAL_TABLET | Freq: Two times a day (BID) | ORAL | Status: DC
  Administered 2015-03-02 – 2015-03-05 (×5): 3.125 mg via ORAL
  Filled 2015-02-28 (×8): qty 1

## 2015-02-28 MED ORDER — ISOSORB DINITRATE-HYDRALAZINE 20-37.5 MG PO TABS
1.0000 | ORAL_TABLET | Freq: Three times a day (TID) | ORAL | Status: DC
  Administered 2015-02-28 – 2015-03-01 (×3): 1 via ORAL
  Filled 2015-02-28 (×4): qty 1

## 2015-02-28 MED ORDER — MORPHINE SULFATE 2 MG/ML IJ SOLN
2.0000 mg | INTRAMUSCULAR | Status: DC | PRN
  Administered 2015-02-28 – 2015-03-04 (×19): 2 mg via INTRAVENOUS
  Filled 2015-02-28 (×20): qty 1

## 2015-02-28 MED ORDER — LORAZEPAM 1 MG PO TABS
1.0000 mg | ORAL_TABLET | Freq: Four times a day (QID) | ORAL | Status: DC | PRN
Start: 2015-02-28 — End: 2015-03-05
  Administered 2015-03-04 (×3): 1 mg via ORAL
  Filled 2015-02-28 (×3): qty 1

## 2015-02-28 NOTE — Progress Notes (Signed)
Patient Demographics:    [name], is a 44 y.o. female, DOB - Apr 19, 1971, [id number]  Admit date - 02/27/2015   Admitting Physician Reubin Milan, MD  Outpatient Primary MD for the patient is Elyn Peers, MD  LOS - 1   Chief Complaint  Patient presents with  . Emesis  . Abdominal Pain        Subjective:    [name] today has, No headache, No chest pain, No new weakness tingling or numbness, No Cough - SOB.   She was sleeping in the bed, appeared to be in no distress, when woken up she complained of 10 out of 10 abdominal pain and wanted IV morphine, complains of ongoing nausea.    Assessment  & Plan :    1.Abdominal pain nonspecific with nausea vomiting in a patient with diabetic gastroparesis. Appears to be in no distress, could have mild gastroenteritis on top of her gastroparesis. No diarrhea. KUB unremarkable. Right upper quadrant ultrasound unremarkable. Abdominal exam benign. Appears to be in no distress, asking for IV morphine and IV Phenergan. Question seeking behavior however for now we'll monitor closely with supportive care. Have requested her to collect all vomitus and a been to see her the extent of her true symptoms. Continue IV fluids and bowel rest. Continue supportive care.   2. History of lupus/Sjogren syndrome. Stable. Home regimen restarted.    3. Essential hypertension - Resume home dose Lopressor along with hydralazine and Imdur. Hold diuretic, ACE/ARB for now.   4. History of chronic diastolic heart failure. Last EF one year ago 55%, currently compensated.    5. GERD. On IV PPI.   6. Morbid obesity. Follow with PCP post discharge.   7. DM type II. Glucophage on hold place on sliding scale.   Lab Results  Component Value Date   HGBA1C 7.1*  01/25/2015    CBG (last 3)  No results for input(s): GLUCAP in the last 72 hours.      Code Status : Full code  Family Communication  : None present  Disposition Plan  : Home later today or early tomorrow  Consults  :  None  Procedures  :  KUB nonacute, right upper quadrant ultrasound stable  DVT Prophylaxis  :  Lovenox   Lab Results  Component Value Date   PLT 314 02/28/2015    Inpatient Medications  Scheduled Meds: . azaTHIOprine  100 mg Oral BID  . carvedilol  3.125 mg Oral BID WC  . citalopram  10 mg Oral QPM  . clopidogrel  75 mg Oral Daily  . cycloSPORINE  1 drop Both Eyes TID  . enoxaparin (LOVENOX) injection  90 mg Subcutaneous QHS  . hydroxychloroquine  200 mg Oral BID  . isosorbide-hydrALAZINE  1 tablet Oral TID  . metoprolol  5 mg Intravenous 4 times per day  . pantoprazole (PROTONIX) IV  40 mg Intravenous QHS   Continuous Infusions: . 0.9 % NaCl with KCl 20 mEq / L 125 mL/hr at 02/28/15 0110   PRN Meds:.hydrALAZINE, LORazepam, morphine injection, ondansetron (ZOFRAN) IV, promethazine  Antibiotics  :     Anti-infectives    Start     Dose/Rate Route Frequency Ordered Stop   02/28/15 1145  hydroxychloroquine (  PLAQUENIL) tablet 200 mg     200 mg Oral 2 times daily 02/28/15 1133          Objective:   Filed Vitals:   02/27/15 2200 02/27/15 2230 02/27/15 2352 02/28/15 0524  BP: 194/85 191/94 189/99 171/96  Pulse: 93 85 92 91  Temp:   98.4 F (36.9 C) 99.2 F (37.3 C)  TempSrc:   Oral Oral  Resp: 16 23 20 22   Height:   5\' 8"  (1.727 m)   Weight:      SpO2: 100% 100% 99% 100%    Wt Readings from Last 3 Encounters:  02/27/15 183.707 kg (405 lb)  01/26/15 180.9 kg (398 lb 13 oz)  11/21/14 181.977 kg (401 lb 3 oz)     Intake/Output Summary (Last 24 hours) at 02/28/15 1133 Last data filed at 02/28/15 0700  Gross per 24 hour  Intake 837.17 ml  Output      0 ml  Net 837.17 ml     Physical Exam  Awake Alert, Oriented X 3, No new  F.N deficits, Normal affect [place].AT,PERRAL Supple Neck,No JVD, No cervical lymphadenopathy appriciated.  Symmetrical Chest wall movement, Good air movement bilaterally, CTAB RRR,No Gallops,Rubs or new Murmurs, No Parasternal Heave +ve B.Sounds, Abd Soft, No tenderness, No organomegaly appriciated, No rebound - guarding or rigidity. No Cyanosis, Clubbing or edema, No new Rash or bruise       Data Review:   Micro Results No results found for this or any previous visit (from the past 240 hour(s)).  Radiology Reports Dg Abd Portable 1v  02/28/2015   CLINICAL DATA:  Abdominal pain, nausea and vomiting for 1 day.  EXAM: PORTABLE ABDOMEN - 1 VIEW  COMPARISON:  11/11/2013 radiographs  FINDINGS: The bowel gas pattern is normal. No radio-opaque calculi or other significant radiographic abnormality are seen.  IMPRESSION: Negative.   Electronically Signed   By: Margarette Canada M.D.   On: 02/28/2015 07:24   US Abdomen Limited Ruq  02/27/2015   CLINICAL DATA:  Hypertension.  Nausea and vomiting  EXAM: US ABDOMEN LIMITED - RIGHT UPPER QUADRANT  COMPARISON:  CT 04/27/2014  FINDINGS: Gallbladder:  No gallstones or wall thickening visualized. Patient patient heavily medicated. Sonographic Murphy's sign could not be assessed.  Common bile duct:  Diameter: 4 mm  Liver:  No focal lesion identified. Within normal limits in parenchymal echogenicity.  IMPRESSION: No evidence of cholecystitis.   Electronically Signed   By: Suzy Bouchard M.D.   On: 02/27/2015 20:48     CBC  Recent Labs Lab 02/27/15 1746 02/28/15 0423  WBC 9.6 12.8*  HGB 10.5* 10.9*  HCT 33.4* 34.5*  PLT 293 314  MCV 84.8 82.7  MCH 26.6 26.1  MCHC 31.4 31.6  RDW 15.3 15.1  LYMPHSABS 2.1 1.5  MONOABS 0.7 0.8  EOSABS 0.1 0.0  BASOSABS 0.0 0.0    Chemistries   Recent Labs Lab 02/27/15 1746 02/28/15 0423  NA 137 135  K 3.7 3.8  CL 105 102  CO2 25 23  GLUCOSE 171* 174*  BUN 6 6  CREATININE 0.93 0.81  CALCIUM 8.4* 8.4*  AST 26  104*  ALT 18 59*  ALKPHOS 67 76  BILITOT 0.2* 0.5   ------------------------------------------------------------------------------------------------------------------ estimated creatinine clearance is 156.4 mL/min (by C-G formula based on Cr of 0.81). ------------------------------------------------------------------------------------------------------------------ No results for input(s): HGBA1C in the last 72 hours. ------------------------------------------------------------------------------------------------------------------ No results for input(s): CHOL, HDL, LDLCALC, TRIG, CHOLHDL, LDLDIRECT in the last 72 hours. ------------------------------------------------------------------------------------------------------------------ No  results for input(s): TSH, T4TOTAL, T3FREE, THYROIDAB in the last 72 hours.  Invalid input(s): FREET3 ------------------------------------------------------------------------------------------------------------------ No results for input(s): VITAMINB12, FOLATE, FERRITIN, TIBC, IRON, RETICCTPCT in the last 72 hours.  Coagulation profile No results for input(s): INR, PROTIME in the last 168 hours.  No results for input(s): DDIMER in the last 72 hours.  Cardiac Enzymes No results for input(s): CKMB, TROPONINI, MYOGLOBIN in the last 168 hours.  Invalid input(s): CK ------------------------------------------------------------------------------------------------------------------ Invalid input(s): POCBNP   Time Spent in minutes  35   SINGH,PRASHANT K M.D on 02/28/2015 at 11:33 AM  Between 7am to 7pm - Pager - [phone]  After 7pm go to [email or web address] - password [facility]  [facility] -  Office  [phone]

## 2015-03-01 DIAGNOSIS — M3500 Sicca syndrome, unspecified: Secondary | ICD-10-CM

## 2015-03-01 LAB — GLUCOSE, CAPILLARY
GLUCOSE-CAPILLARY: 146 mg/dL — AB (ref 65–99)
GLUCOSE-CAPILLARY: 152 mg/dL — AB (ref 65–99)
GLUCOSE-CAPILLARY: 179 mg/dL — AB (ref 65–99)
Glucose-Capillary: 138 mg/dL — ABNORMAL HIGH (ref 65–99)
Glucose-Capillary: 172 mg/dL — ABNORMAL HIGH (ref 65–99)
Glucose-Capillary: 155 mg/dL — ABNORMAL HIGH (ref 65–99)
Glucose-Capillary: 145 mg/dL — ABNORMAL HIGH (ref 65–99)

## 2015-03-01 MED ORDER — KETOROLAC TROMETHAMINE 30 MG/ML IJ SOLN
30.0000 mg | Freq: Once | INTRAMUSCULAR | Status: AC
  Administered 2015-03-01: 30 mg via INTRAVENOUS
  Filled 2015-03-01: qty 1

## 2015-03-01 MED ORDER — ALUM & MAG HYDROXIDE-SIMETH 200-200-20 MG/5ML PO SUSP
30.0000 mL | Freq: Three times a day (TID) | ORAL | Status: DC
  Administered 2015-03-01 (×2): 30 mL via ORAL
  Filled 2015-03-01 (×4): qty 30

## 2015-03-01 MED ORDER — PROMETHAZINE HCL 25 MG/ML IJ SOLN
25.0000 mg | INTRAMUSCULAR | Status: DC | PRN
Start: 2015-03-01 — End: 2015-03-05
  Administered 2015-03-01 – 2015-03-05 (×18): 25 mg via INTRAVENOUS
  Filled 2015-03-01 (×20): qty 1

## 2015-03-01 MED ORDER — PANTOPRAZOLE SODIUM 40 MG IV SOLR
40.0000 mg | Freq: Two times a day (BID) | INTRAVENOUS | Status: DC
  Administered 2015-03-01 (×2): 40 mg via INTRAVENOUS
  Filled 2015-03-01 (×2): qty 40

## 2015-03-01 MED ORDER — DEXTROSE-NACL 5-0.45 % IV SOLN
INTRAVENOUS | Status: DC
  Administered 2015-03-01 – 2015-03-02 (×2): via INTRAVENOUS

## 2015-03-01 MED ORDER — AMLODIPINE BESYLATE 10 MG PO TABS
10.0000 mg | ORAL_TABLET | Freq: Every day | ORAL | Status: DC
  Administered 2015-03-01 – 2015-03-04 (×3): 10 mg via ORAL
  Filled 2015-03-01 (×4): qty 1

## 2015-03-01 NOTE — Progress Notes (Signed)
Patient Demographics:    [name], is a 44 y.o. female, DOB - 1970/08/11, [id number]  Admit date - 02/27/2015   Admitting Physician Reubin Milan, MD  Outpatient Primary MD for the patient is Elyn Peers, MD  LOS - 2   Chief Complaint  Patient presents with  . Emesis  . Abdominal Pain        Subjective:    [name] today has, No headache, No chest pain, No new weakness tingling or numbness, No Cough - SOB.   Complains of ongoing nausea and minimal epigastric abdominal pain, complains of reflux.   Assessment  & Plan :    1. Abdominal pain nonspecific with nausea vomiting in a patient with diabetic gastroparesis. Appears to be in no distress, could have mild gastroenteritis on top of her gastroparesis. No diarrhea. KUB unremarkable. Right upper quadrant ultrasound unremarkable. Abdominal exam benign. Appears to be in no distress, asking for IV morphine and IV Phenergan. Question seeking behavior however for now we'll monitor closely with supportive care. Have requested her to collect all vomitus and a been to see her the extent of her true symptoms. Continue IV fluids and bowel rest. Continue supportive care.  She is also complaining of some reflux with relief when she gets Protonix, placed on IV PPI twice a day along with GI cocktail 3 times a day. Monitor.   2. History of lupus/Sjogren syndrome. Stable. Home regimen restarted.    3. Essential hypertension - Resume home dose Lopressor along with hydralazine and Imdur. Hold diuretic, ACE/ARB for now.   4. History of chronic diastolic heart failure. Last EF one year ago 55%, currently compensated.    5. GERD. On IV PPI dose increased   6. Morbid obesity. Follow with PCP post discharge.   7. DM type II. Glucophage on hold  place on sliding scale.   Lab Results  Component Value Date   HGBA1C 7.1* 01/25/2015    CBG (last 3)   Recent Labs  03/01/15 0005 03/01/15 0429 03/01/15 0819  GLUCAP 152* 138* 172*        Code Status : Full code  Family Communication  : None present  Disposition Plan  : Home later today or early tomorrow  Consults  :  None  Procedures  :  KUB nonacute, right upper quadrant ultrasound stable  DVT Prophylaxis  :  Lovenox   Lab Results  Component Value Date   PLT 314 02/28/2015    Inpatient Medications  Scheduled Meds: . alum & mag hydroxide-simeth  30 mL Oral TID  . amLODipine  10 mg Oral Daily  . azaTHIOprine  100 mg Oral BID  . carvedilol  3.125 mg Oral BID WC  . citalopram  10 mg Oral QPM  . clopidogrel  75 mg Oral Daily  . cycloSPORINE  1 drop Both Eyes TID  . enoxaparin (LOVENOX) injection  90 mg Subcutaneous QHS  . hydroxychloroquine  200 mg Oral BID  . insulin aspart  0-9 Units Subcutaneous Q4H  . isosorbide-hydrALAZINE  1 tablet Oral TID  . pantoprazole (PROTONIX) IV  40 mg Intravenous Q12H   Continuous Infusions:   PRN Meds:.hydrALAZINE, LORazepam, morphine injection, ondansetron (ZOFRAN) IV, promethazine  Antibiotics  :  Anti-infectives    Start     Dose/Rate Route Frequency Ordered Stop   02/28/15 1300  hydroxychloroquine (PLAQUENIL) tablet 200 mg     200 mg Oral 2 times daily 02/28/15 1133          Objective:   Filed Vitals:   02/28/15 2122 02/28/15 2300 03/01/15 0503 03/01/15 0638  BP: 170/65 157/69 184/85 160/78  Pulse: 88  107   Temp: 99.5 F (37.5 C)  97.9 F (36.6 C)   TempSrc: Oral  Oral   Resp: 20  20   Height:      Weight:      SpO2: 97%  96%     Wt Readings from Last 3 Encounters:  02/27/15 183.707 kg (405 lb)  01/26/15 180.9 kg (398 lb 13 oz)  11/21/14 181.977 kg (401 lb 3 oz)    No intake or output data in the 24 hours ending 03/01/15 1054   Physical Exam  Awake Alert, Oriented X 3, No new F.N  deficits, Normal affect [place].AT,PERRAL Supple Neck,No JVD, No cervical lymphadenopathy appriciated.  Symmetrical Chest wall movement, Good air movement bilaterally, CTAB RRR,No Gallops,Rubs or new Murmurs, No Parasternal Heave +ve B.Sounds, Abd Soft, No tenderness, No organomegaly appriciated, No rebound - guarding or rigidity. No Cyanosis, Clubbing or edema, No new Rash or bruise       Data Review:   Micro Results No results found for this or any previous visit (from the past 240 hour(s)).  Radiology Reports Dg Abd Portable 1v  02/28/2015   CLINICAL DATA:  Abdominal pain, nausea and vomiting for 1 day.  EXAM: PORTABLE ABDOMEN - 1 VIEW  COMPARISON:  11/11/2013 radiographs  FINDINGS: The bowel gas pattern is normal. No radio-opaque calculi or other significant radiographic abnormality are seen.  IMPRESSION: Negative.   Electronically Signed   By: Margarette Canada M.D.   On: 02/28/2015 07:24   US Abdomen Limited Ruq  02/27/2015   CLINICAL DATA:  Hypertension.  Nausea and vomiting  EXAM: US ABDOMEN LIMITED - RIGHT UPPER QUADRANT  COMPARISON:  CT 04/27/2014  FINDINGS: Gallbladder:  No gallstones or wall thickening visualized. Patient patient heavily medicated. Sonographic Murphy's sign could not be assessed.  Common bile duct:  Diameter: 4 mm  Liver:  No focal lesion identified. Within normal limits in parenchymal echogenicity.  IMPRESSION: No evidence of cholecystitis.   Electronically Signed   By: Suzy Bouchard M.D.   On: 02/27/2015 20:48     CBC  Recent Labs Lab 02/27/15 1746 02/28/15 0423  WBC 9.6 12.8*  HGB 10.5* 10.9*  HCT 33.4* 34.5*  PLT 293 314  MCV 84.8 82.7  MCH 26.6 26.1  MCHC 31.4 31.6  RDW 15.3 15.1  LYMPHSABS 2.1 1.5  MONOABS 0.7 0.8  EOSABS 0.1 0.0  BASOSABS 0.0 0.0    Chemistries   Recent Labs Lab 02/27/15 1746 02/28/15 0423  NA 137 135  K 3.7 3.8  CL 105 102  CO2 25 23  GLUCOSE 171* 174*  BUN 6 6  CREATININE 0.93 0.81  CALCIUM 8.4* 8.4*  AST 26 104*    ALT 18 59*  ALKPHOS 67 76  BILITOT 0.2* 0.5   ------------------------------------------------------------------------------------------------------------------ estimated creatinine clearance is 156.4 mL/min (by C-G formula based on Cr of 0.81). ------------------------------------------------------------------------------------------------------------------ No results for input(s): HGBA1C in the last 72 hours. ------------------------------------------------------------------------------------------------------------------ No results for input(s): CHOL, HDL, LDLCALC, TRIG, CHOLHDL, LDLDIRECT in the last 72 hours. ------------------------------------------------------------------------------------------------------------------ No results for input(s): TSH, T4TOTAL, T3FREE, THYROIDAB in the  last 72 hours.  Invalid input(s): FREET3 ------------------------------------------------------------------------------------------------------------------ No results for input(s): VITAMINB12, FOLATE, FERRITIN, TIBC, IRON, RETICCTPCT in the last 72 hours.  Coagulation profile No results for input(s): INR, PROTIME in the last 168 hours.  No results for input(s): DDIMER in the last 72 hours.  Cardiac Enzymes No results for input(s): CKMB, TROPONINI, MYOGLOBIN in the last 168 hours.  Invalid input(s): CK ------------------------------------------------------------------------------------------------------------------ Invalid input(s): POCBNP   Time Spent in minutes  35   Oneal Schoenberger K M.D on 03/01/2015 at 10:54 AM  Between 7am to 7pm - Pager - [phone]  After 7pm go to [email or web address] - password [facility]  [facility] -  Office  [phone]

## 2015-03-01 NOTE — Plan of Care (Signed)
Problem: Discharge Progression Outcomes Goal: Tolerating diet Outcome: Not Progressing Pt has N/V

## 2015-03-01 NOTE — Progress Notes (Signed)
Patient has vomited 2 times in my presence. Dr Candiss Norse was notified earlier and increased phenergan to 25mg  q4hrprn. Joslyn Hy, MSN, RN, [employer]

## 2015-03-02 ENCOUNTER — Encounter (HOSPITAL_COMMUNITY): Payer: Self-pay | Admitting: Physician Assistant

## 2015-03-02 ENCOUNTER — Inpatient Hospital Stay (HOSPITAL_COMMUNITY): Payer: Medicaid Other

## 2015-03-02 ENCOUNTER — Inpatient Hospital Stay (HOSPITAL_COMMUNITY): Payer: Medicaid Other | Admitting: Anesthesiology

## 2015-03-02 ENCOUNTER — Encounter (HOSPITAL_COMMUNITY)
Admission: EM | Disposition: A | Discharge status: Home / Self Care | Payer: Self-pay | Source: Home / Self Care | Attending: Internal Medicine

## 2015-03-02 DIAGNOSIS — R112 Nausea with vomiting, unspecified: Secondary | ICD-10-CM | POA: Insufficient documentation

## 2015-03-02 DIAGNOSIS — G43A1 Cyclical vomiting, intractable: Secondary | ICD-10-CM

## 2015-03-02 DIAGNOSIS — R1110 Vomiting, unspecified: Secondary | ICD-10-CM

## 2015-03-02 HISTORY — PX: ESOPHAGOGASTRODUODENOSCOPY: SHX5428

## 2015-03-02 LAB — HEMOGLOBIN A1C
Hgb A1c MFr Bld: 6.8 % — ABNORMAL HIGH (ref 4.8–5.6)
MEAN PLASMA GLUCOSE: 148 mg/dL

## 2015-03-02 LAB — GLUCOSE, CAPILLARY
Glucose-Capillary: 157 mg/dL — ABNORMAL HIGH (ref 65–99)
Glucose-Capillary: 178 mg/dL — ABNORMAL HIGH (ref 65–99)
Glucose-Capillary: 190 mg/dL — ABNORMAL HIGH (ref 65–99)
Glucose-Capillary: 150 mg/dL — ABNORMAL HIGH (ref 65–99)
Glucose-Capillary: 143 mg/dL — ABNORMAL HIGH (ref 65–99)

## 2015-03-02 SURGERY — EGD (ESOPHAGOGASTRODUODENOSCOPY)
Anesthesia: General

## 2015-03-02 MED ORDER — DEXTROSE-NACL 5-0.45 % IV SOLN: INTRAVENOUS | Status: AC

## 2015-03-02 MED ORDER — MIDAZOLAM HCL 5 MG/ML IJ SOLN
INTRAMUSCULAR | Status: AC
  Filled 2015-03-02: qty 1

## 2015-03-02 MED ORDER — MORPHINE SULFATE 2 MG/ML IJ SOLN
1.0000 mg | Freq: Once | INTRAMUSCULAR | Status: AC
  Administered 2015-03-02: 1 mg via INTRAVENOUS

## 2015-03-02 MED ORDER — SODIUM CHLORIDE 0.9 % IV SOLN
INTRAVENOUS | Status: DC
Start: 2015-03-02 — End: 2015-03-03

## 2015-03-02 MED ORDER — NITROGLYCERIN 2 % TD OINT
1.0000 [in_us] | TOPICAL_OINTMENT | Freq: Four times a day (QID) | TRANSDERMAL | Status: DC
  Administered 2015-03-02 – 2015-03-05 (×10): 1 [in_us] via TOPICAL
  Filled 2015-03-02: qty 30

## 2015-03-02 MED ORDER — LACTATED RINGERS IV SOLN
INTRAVENOUS | Status: DC
  Administered 2015-03-02: 1000 mL via INTRAVENOUS

## 2015-03-02 MED ORDER — ISOSORBIDE MONONITRATE ER 30 MG PO TB24: 30.0000 mg | ORAL_TABLET | Freq: Every day | ORAL | Status: DC

## 2015-03-02 MED ORDER — SODIUM CHLORIDE 0.9 % IV SOLN
8.0000 mg | Freq: Three times a day (TID) | INTRAVENOUS | Status: DC
  Administered 2015-03-02 – 2015-03-04 (×7): 8 mg via INTRAVENOUS
  Filled 2015-03-02 (×16): qty 4

## 2015-03-02 MED ORDER — SUCCINYLCHOLINE CHLORIDE 20 MG/ML IJ SOLN
INTRAMUSCULAR | Status: DC | PRN
  Administered 2015-03-02: 200 mg via INTRAVENOUS

## 2015-03-02 MED ORDER — ONDANSETRON HCL 4 MG/2ML IJ SOLN
INTRAMUSCULAR | Status: DC | PRN
  Administered 2015-03-02: 4 mg via INTRAVENOUS

## 2015-03-02 MED ORDER — LACTATED RINGERS IV SOLN
INTRAVENOUS | Status: DC | PRN
  Administered 2015-03-02: 11:00:00 via INTRAVENOUS

## 2015-03-02 MED ORDER — SODIUM CHLORIDE 0.9 % IV SOLN
500.0000 mg | Freq: Four times a day (QID) | INTRAVENOUS | Status: DC
  Administered 2015-03-02 – 2015-03-05 (×11): 500 mg via INTRAVENOUS
  Filled 2015-03-02 (×15): qty 10

## 2015-03-02 MED ORDER — SODIUM CHLORIDE 0.9 % IV SOLN
8.0000 mg | Freq: Four times a day (QID) | INTRAVENOUS | Status: DC | PRN
  Filled 2015-03-02: qty 4

## 2015-03-02 MED ORDER — PROPOFOL 10 MG/ML IV BOLUS
INTRAVENOUS | Status: DC | PRN
  Administered 2015-03-02: 200 mg via INTRAVENOUS

## 2015-03-02 MED ORDER — ONDANSETRON HCL 4 MG PO TABS: 4.0000 mg | ORAL_TABLET | Freq: Two times a day (BID) | ORAL | Status: DC

## 2015-03-02 MED ORDER — ONDANSETRON HCL 40 MG/20ML IJ SOLN: 8.0000 mg | Freq: Four times a day (QID) | INTRAMUSCULAR | Status: DC | PRN

## 2015-03-02 MED ORDER — HYDRALAZINE HCL 50 MG PO TABS
50.0000 mg | ORAL_TABLET | Freq: Three times a day (TID) | ORAL | Status: DC
  Administered 2015-03-03 – 2015-03-05 (×3): 50 mg via ORAL
  Filled 2015-03-02 (×4): qty 1

## 2015-03-02 MED ORDER — MIDAZOLAM HCL 5 MG/5ML IJ SOLN
INTRAMUSCULAR | Status: DC | PRN
  Administered 2015-03-02: 2 mg via INTRAVENOUS

## 2015-03-02 MED ORDER — PANTOPRAZOLE SODIUM 40 MG PO TBEC
40.0000 mg | DELAYED_RELEASE_TABLET | Freq: Two times a day (BID) | ORAL | Status: DC
  Administered 2015-03-02 – 2015-03-05 (×5): 40 mg via ORAL
  Filled 2015-03-02 (×6): qty 1

## 2015-03-02 NOTE — Transfer of Care (Signed)
Immediate Anesthesia Transfer of Care Note  Patient: [name]  Procedure(s) Performed: Procedure(s): ESOPHAGOGASTRODUODENOSCOPY (EGD) (N/A)  Patient Location: Endoscopy Unit  Anesthesia Type:General  Level of Consciousness: awake and alert   Airway & Oxygen Therapy: Patient Spontanous Breathing and Patient connected to face mask oxygen  Post-op Assessment: Report given to RN, Post -op Vital signs reviewed and stable and Patient moving all extremities  Post vital signs: Reviewed and stable  Last Vitals:  Filed Vitals:   03/02/15 1144  BP: 161/76  Pulse: 106  Temp: 37.2 C  Resp: 18    Complications: No apparent anesthesia complications

## 2015-03-02 NOTE — Consult Note (Addendum)
[facility] Consult: 8:40 AM 03/02/2015  LOS: 3 days    Referring Provider: Dr Candiss Norse  Primary Care Physician:  Elyn Peers, MD Primary Gastroenterologist:  Althia Forts.  Dr [place] Pink and Dr Ned Card at [facility]    Reason for Consultation:  N/v and abdominal pain.    HPI: [name] is a 44 y.o. female.  Morbid obesity. Sjogren's syndrome.  Lupus.  Type 2 DM. CHF.  OSA.  Home oxygen. Diabetic gastroparesis (however do not see GES in record).  Right liver lesion, biopsy benign at [facility], s/p xray "Criss Rosales" ablation 2014.  Hx abnormal uterine bleeding and gross hematuria.  Normocytic anemia.    Home meds (>20)include Prednisone 5 mg, Fosamax weekly, Imuran, Plavix, MSIR, Plaquenil, 81 ASA, Pepcid 20 mg BID.    01/2013 EGD for n/v/abd pain: distal reflux esophagitis. 01/2013 Abd MRI: 7 cm liver lesion: not of hepatocellular origin, differential considerations primarily include an atypical giant cavernous hemangioma, atypical adenoma with central necrosis, or less likely a large solitary metastasis with central necrosis. Lack of enhancement on the delayed images with Eovist indicates that this is not a large focal nodular hyperplasia ([place]). At this time, a short term follow-up MRI with Multihance (not Eovist) in 2- 3 months is recommended to assess for size stability, and to better evaluate the dynamic post-contrast enhancement characteristics of this lesion. 10/2013 CT abdomen: right liver lesion smaller at 2.5 cm (c/w 10.6 in 2014) .  05/2014 MRI abdomen: 1. Continued decrease size of the hepatic segment 4 B/5 mass following Bland embolization. Persistent enhancing tissue posteriorly to the hemorrhagic component which likely represents viable tissue. Attention on followup exam. Resolution of hepatic  capsular bulging noted on prior exam. 2. Hypervascular segment 3 lesion noted on prior exam is not well-seen today secondary to motion and may represent a second adenoma. Attention on followup.  Asked to see [id number] abdominal pain, n/v. Pain is intense, "all over".  Not radiating.  Sxs no better with her usual Morphine and vomits up her nausea meds.  No bloody emesis. Last stool was on 7/30. She normally moves her bowels daily or every other day. Says this episode worse than previous episode and she had been free of GI sxs for a long time.  Last previous bout with similar sxs was > 1 year ago. 1 view abd xray negative  Ultrasound benign with no gallstones, 4 mm CBD, normal liver.  AST/ALT 104/59.  Normal t bili and alk phos. WBCs to 12.8.  U/A benign.     Past Medical History  Diagnosis Date  . Bell's palsy 08/09/2010  . SJOGREN'S SYNDROME 08/09/2010  . Hypertension   . High cholesterol   . Anginal pain   . Obstructive sleep apnea on CPAP 2011  . Anxiety   . GERD (gastroesophageal reflux disease)   . CHF (congestive heart failure)   . Acute renal failure      Intractable nausea vomiting secondary to diabetic gastroparesis causing dehydration and acute renal failure /notes 04/01/2013  . Liver mass     biopsied 03/2013  at [facility], not malignant.  is to undergo radiologic ablation of the mass in sept/October 2014.   . On home oxygen therapy     "2L 24/7" (02/28/2015)  . Migraines     "maybe a couple times/yr" (04/01/2013)  . Stroke 2010; 10/2012    "left side is still weak from it, never fully regained full strength; no additions from stroke 10/2012"  . Obesity   . Family history of malignant neoplasm of breast   . Family history of malignant neoplasm of ovary   . Chest pain 01/13/2014  . Diabetic gastroparesis associated with type 2 diabetes mellitus     this is presumed diagnoses, not confirmed by any studies.   . Shortness of breath   . SLE (systemic lupus erythematosus)      Archie Endo 12/01/2010  . CAP (community acquired pneumonia) 10/2011    Archie Endo 11/08/2011    Past Surgical History  Procedure Laterality Date  . Ectopic pregnancy surgery  1999  . Cardiac catheterization  02/07/12  . Hernia repair    . Esophagogastroduodenoscopy N/A 02/26/2013    Procedure: ESOPHAGOGASTRODUODENOSCOPY (EGD);  Surgeon: Irene Shipper, MD;  Location: [facility] ENDOSCOPY;  Service: Endoscopy;  Laterality: N/A;  . Breast cyst excision Left 08/2005    epidermoid  . Left and right heart catheterization with coronary angiogram N/A 02/07/2012    Procedure: LEFT AND RIGHT HEART CATHETERIZATION WITH CORONARY ANGIOGRAM;  Surgeon: Laverda Page, MD;  Location: [facility] CATH LAB;  Service: Cardiovascular;  Laterality: N/A;  . Umbilical hernia repair  1980's  . Muscle biopsy      for lupus/notes 12/01/2010  . Liver biopsy  03/2013    liver mass/medical hx noted above    Prior to Admission medications   Medication Sig Start Date End Date Taking? Authorizing Provider  Albiglutide (TANZEUM) 30 MG PEN Inject 30 mg into the skin once a week. Sundays   Yes Historical Provider, MD  albuterol (PROAIR HFA) 108 (90 BASE) MCG/ACT inhaler Inhale 2 puffs into the lungs every 6 (six) hours as needed for wheezing or shortness of breath.    Yes Historical Provider, MD  albuterol (PROVENTIL) (5 MG/ML) 0.5% nebulizer solution Take 2.5 mg by nebulization every 6 (six) hours as needed for wheezing or shortness of breath.  06/24/14  Yes Historical Provider, MD  alendronate (FOSAMAX) 70 MG tablet Take 70 mg by mouth every Saturday. Take with a full glass of water on an empty stomach.   Yes Historical Provider, MD  aspirin EC 81 MG tablet Take 81 mg by mouth daily.   Yes Historical Provider, MD  azaTHIOprine (IMURAN) 50 MG tablet Take 100 mg by mouth 2 (two) times daily.    Yes Historical Provider, MD  carvedilol (COREG) 3.125 MG tablet Take 3.125 mg by mouth 2 (two) times daily with a meal.   Yes Historical Provider, MD  cetirizine  (ZYRTEC) 10 MG tablet Take 10 mg by mouth daily.   Yes Historical Provider, MD  citalopram (CELEXA) 10 MG tablet Take 1 tablet (10 mg total) by mouth every evening. 04/06/13  Yes Belkys A Regalado, MD  clopidogrel (PLAVIX) 75 MG tablet Take 75 mg by mouth daily.   Yes Historical Provider, MD  cycloSPORINE (RESTASIS) 0.05 % ophthalmic emulsion Place 1 drop into both eyes 3 (three) times daily.   Yes Historical Provider, MD  famotidine (PEPCID) 20 MG tablet Take 20 mg by mouth 2 (two) times daily.   Yes Historical Provider, MD  fluorometholone (FML)  0.1 % ophthalmic suspension Place 1 drop into both eyes 2 (two) times daily. 02/11/15  Yes Historical Provider, MD  hydroxychloroquine (PLAQUENIL) 200 MG tablet Take 200 mg by mouth 2 (two) times daily.    Yes Historical Provider, MD  insulin regular human CONCENTRATED (HUMULIN R) 500 UNIT/ML SOLN injection INJECT 0.12 MLS (60 UNITS TOTAL) INTO THE SKIN 3 (THREE) TIMES DAILY WITH MEALS. 12/12/14  Yes Renato Shin, MD  isosorbide-hydrALAZINE (BIDIL) 20-37.5 MG per tablet Take 1 tablet by mouth 3 (three) times daily.   Yes Historical Provider, MD  LORazepam (ATIVAN) 1 MG tablet Take 1 tablet (1 mg total) by mouth every 6 (six) hours as needed for anxiety. 11/22/14  Yes Montine Circle, PA-C  losartan (COZAAR) 100 MG tablet Take 100 mg by mouth daily. 06/18/14  Yes Historical Provider, MD  megestrol (MEGACE) 40 MG tablet Take 40 mg by mouth daily. 01/30/15  Yes Historical Provider, MD  metFORMIN (GLUCOPHAGE-XR) 500 MG 24 hr tablet Take 1,000 mg by mouth 2 (two) times daily.   Yes Historical Provider, MD  morphine (MSIR) 15 MG tablet Take 30 mg by mouth every evening.    Yes Historical Provider, MD  nitroGLYCERIN (NITROSTAT) 0.4 MG SL tablet Place 0.4 mg under the tongue every 5 (five) minutes x 3 doses as needed for chest pain.  02/26/15 02/26/16 Yes Historical Provider, MD  NUVARING 0.12-0.015 MG/24HR vaginal ring Place 1 each vaginally every 21 ( twenty-one) days.   01/28/15  Yes Historical Provider, MD  PAZEO 0.7 % SOLN Place 1 drop into both eyes every morning. 02/11/15  Yes Historical Provider, MD  Polyvinyl Alcohol-Povidone (REFRESH OP) Place 1 drop into both eyes 2 (two) times daily.   Yes Historical Provider, MD  potassium chloride (MICRO-K) 10 MEQ CR capsule Take 10 mEq by mouth 2 (two) times daily.  08/21/14  Yes Historical Provider, MD  predniSONE (DELTASONE) 5 MG tablet Take 5 mg by mouth daily. 02/25/15  Yes Historical Provider, MD  pregabalin (LYRICA) 75 MG capsule Take 75 mg by mouth 2 (two) times daily. 01/28/15  Yes Historical Provider, MD  torsemide (DEMADEX) 20 MG tablet Take 1 tablet (20 mg total) by mouth 2 (two) times daily. 04/06/13  Yes Belkys A Regalado, MD    Scheduled Meds: . alum & mag hydroxide-simeth  30 mL Oral TID  . amLODipine  10 mg Oral Daily  . azaTHIOprine  100 mg Oral BID  . carvedilol  3.125 mg Oral BID WC  . citalopram  10 mg Oral QPM  . clopidogrel  75 mg Oral Daily  . cycloSPORINE  1 drop Both Eyes TID  . enoxaparin (LOVENOX) injection  90 mg Subcutaneous QHS  . hydroxychloroquine  200 mg Oral BID  . insulin aspart  0-9 Units Subcutaneous Q4H  . isosorbide-hydrALAZINE  1 tablet Oral TID  . pantoprazole (PROTONIX) IV  40 mg Intravenous Q12H   Infusions: . dextrose 5 % and 0.45% NaCl 75 mL/hr at 03/02/15 0658   PRN Meds: hydrALAZINE, LORazepam, morphine injection, ondansetron (ZOFRAN) IV, promethazine   Allergies as of 02/27/2015 - Review Complete 02/27/2015  Allergen Reaction Noted  . Metoclopramide Other (See Comments) 04/25/2013  . Oxycodone-acetaminophen Hives and Rash 08/09/2010  . Codeine Itching, Rash, and Other (See Comments) 05/09/2013    Family History  Problem Relation Age of Onset  . Diabetes Mother   . Hypertension Other   . Stroke Other   . Arthritis Other   . Ovarian cancer Sister 31  maternal half-sister; RAD51C positive  . Breast cancer Maternal Aunt 85    currently 15  . Stomach  cancer Maternal Uncle     dx 39s; deceased  . Cancer Paternal Aunt     unk. primary; deceased 81s  . Prostate cancer Paternal Uncle     deceased 8s  . Breast cancer Cousin     deceased 87s; daughter of mat uncle with stomach ca  . Prostate cancer Maternal Uncle     deceased 65s  . Breast cancer Maternal Aunt     deceased 15s  . Cancer Maternal Aunt     unk. primary  . Cancer Maternal Aunt     unk. primary  . Ovarian cancer Maternal Aunt     deceased 35s    History   Social History  . Marital Status: Single    Spouse Name: N/A  . Number of Children: N/A  . Years of Education: N/A   Occupational History  .      student   Social History Main Topics  . Smoking status: Former Smoker -- 3 years    Types: Cigarettes    Quit date: 06/01/1990  . Smokeless tobacco: Never Used  . Alcohol Use: No  . Drug Use: No  . Sexual Activity:    Partners: Male    Birth Control/ Protection: None   Other Topics Concern  . Not on file   Social History Narrative   Regular exercise-yes    REVIEW OF SYSTEMS: Constitutional:  No falls, generally no weakness.  ENT:  No nose bleeds Pulm:  No new SOB or cough CV:  No palpitations, no LE edema. No chest pain GU:  No hematuria, no frequency GI:  Per HPI.   Heme:  No unusual bleeding or bruising   Transfusions:  None in records.  Neuro:  No headaches, no peripheral tingling or numbness Derm:  No itching, no rash or sores.  Endocrine:  No sweats or chills.  No polyuria or dysuria.   Immunization:  Not queried.  Travel:  None beyond local counties in last few months.    PHYSICAL EXAM: Vital signs in last 24 hours: Filed Vitals:   03/02/15 0529  BP: 125/62  Pulse: 106  Temp: 98.9 F (37.2 C)  Resp: 18   Wt Readings from Last 3 Encounters:  02/27/15 405 lb (183.707 kg)  01/26/15 398 lb 13 oz (180.9 kg)  11/21/14 401 lb 3 oz (181.977 kg)    General: Super obese, uncomfortable, moaning, AAF. Head:  Cushingoid faces  Eyes:   No icterus or jaundice Ears:  Not HOH.  Nose:  No congestion, no discharge. Mouth:  Moist, clear MM. Neck:  Obese. Do not appreciate masses, thyromegaly or JVD. Lungs:  Greatly diminished breath sounds bilaterally. No dyspnea, no cough. No adventitious sounds. Heart: Regular rate, slightly tachycardia. No MRG. S1/S2 audible. Abdomen:  Obese, soft, hypoactive bowel sounds. No masses or organomegaly appreciated. Patient's exam relatively benign though she says there is tenderness to touch. No guarding or rebound..   Rectal: Deferred   Musc/Skeltl: No obvious joint contractures, swelling, or erythema Extremities:  No edema  Neurologic:  Oriented 3. Somewhat drowsy. Moves all 4 limbs. No tremor Skin:  No skin lesions, rashes. Tattoos:  None seen Nodes:  No cervical adenopathy.   Psych:  Anxious, urgently repeating her request for symptom relief of her abdominal pain.  LAB RESULTS:  Recent Labs  02/27/15 1746 02/28/15 0423  WBC 9.6 12.8*  HGB 10.5*  10.9*  HCT 33.4* 34.5*  PLT 293 314   BMET Lab Results  Component Value Date   NA 135 02/28/2015   NA 137 02/27/2015   NA 139 01/27/2015   K 3.8 02/28/2015   K 3.7 02/27/2015   K 4.1 01/27/2015   CL 102 02/28/2015   CL 105 02/27/2015   CL 109 01/27/2015   CO2 23 02/28/2015   CO2 25 02/27/2015   CO2 25 01/27/2015   GLUCOSE 174* 02/28/2015   GLUCOSE 171* 02/27/2015   GLUCOSE 128* 01/27/2015   BUN 6 02/28/2015   BUN 6 02/27/2015   BUN 17 01/27/2015   CREATININE 0.81 02/28/2015   CREATININE 0.93 02/27/2015   CREATININE 0.98 01/27/2015   CALCIUM 8.4* 02/28/2015   CALCIUM 8.4* 02/27/2015   CALCIUM 7.9* 01/27/2015   LFT  Recent Labs  02/27/15 1746 02/28/15 0423  PROT 6.7 7.1  ALBUMIN 2.9* 3.0*  AST 26 104*  ALT 18 59*  ALKPHOS 67 76  BILITOT 0.2* 0.5   PT/INR Lab Results  Component Value Date   INR 0.94 01/25/2015   INR 0.90 03/05/2014   INR 1.01 11/11/2013      Component Value Date/Time   LIPASE 28  02/27/2015 1746    ENDOSCOPIC STUDIES: See HPI  IMPRESSION:   *  Nausea, vomiting, abdominal pain. Not well controlled with current meds.  Reflux esophagitis noted on July/2014 EGD. Outpatient acid suppression coverage with famotidine  *  Normocytic anemia, chronic.  Hgb drop of 2 grams in 24 hours likely reflects hydration.  Currnel Hgb is within historic range of 9.5 to 10.5.Marland Kitchen   *  Sjogren's syndrome, lupus. On chronic low-dose prednisone.  *  Status post ablation of benign liver lesion.    PLAN:     *  Per Dr Ardis Hughs:  EGD today.   Azucena Freed  03/02/2015, 8:40 AM Pager: [phone]   ________________________________________________________________________  Velora Heckler GI MD note:  I personally examined the patient, reviewed the data and agree with the assessment and plan described above.  Seems most likely GERD related, chronic illness and massive obesity (BMI 62) contribute. Planning on EGD today.    Owens Loffler, MD [facility] Pager [phone]

## 2015-03-02 NOTE — H&P (View-Only) (Signed)
[facility] Consult: 8:40 AM 03/02/2015  LOS: 3 days    Referring Provider: Dr Candiss Norse  Primary Care Physician:  Elyn Peers, MD Primary Gastroenterologist:  Althia Forts.  Dr [place] Pink and Dr Ned Card at [facility]    Reason for Consultation:  N/v and abdominal pain.    HPI: [name] is a 44 y.o. female.  Morbid obesity. Sjogren's syndrome.  Lupus.  Type 2 DM. CHF.  OSA.  Home oxygen. Diabetic gastroparesis (however do not see GES in record).  Right liver lesion, biopsy benign at [facility], s/p xray "Criss Rosales" ablation 2014.  Hx abnormal uterine bleeding and gross hematuria.  Normocytic anemia.    Home meds (>20)include Prednisone 5 mg, Fosamax weekly, Imuran, Plavix, MSIR, Plaquenil, 81 ASA, Pepcid 20 mg BID.    01/2013 EGD for n/v/abd pain: distal reflux esophagitis. 01/2013 Abd MRI: 7 cm liver lesion: not of hepatocellular origin, differential considerations primarily include an atypical giant cavernous hemangioma, atypical adenoma with central necrosis, or less likely a large solitary metastasis with central necrosis. Lack of enhancement on the delayed images with Eovist indicates that this is not a large focal nodular hyperplasia ([place]). At this time, a short term follow-up MRI with Multihance (not Eovist) in 2- 3 months is recommended to assess for size stability, and to better evaluate the dynamic post-contrast enhancement characteristics of this lesion. 10/2013 CT abdomen: right liver lesion smaller at 2.5 cm (c/w 10.6 in 2014) .  05/2014 MRI abdomen: 1. Continued decrease size of the hepatic segment 4 B/5 mass following Bland embolization. Persistent enhancing tissue posteriorly to the hemorrhagic component which likely represents viable tissue. Attention on followup exam. Resolution of hepatic  capsular bulging noted on prior exam. 2. Hypervascular segment 3 lesion noted on prior exam is not well-seen today secondary to motion and may represent a second adenoma. Attention on followup.  Asked to see [id number] abdominal pain, n/v. Pain is intense, "all over".  Not radiating.  Sxs no better with her usual Morphine and vomits up her nausea meds.  No bloody emesis. Last stool was on 7/30. She normally moves her bowels daily or every other day. Says this episode worse than previous episode and she had been free of GI sxs for a long time.  Last previous bout with similar sxs was > 1 year ago. 1 view abd xray negative  Ultrasound benign with no gallstones, 4 mm CBD, normal liver.  AST/ALT 104/59.  Normal t bili and alk phos. WBCs to 12.8.  U/A benign.     Past Medical History  Diagnosis Date  . Bell's palsy 08/09/2010  . SJOGREN'S SYNDROME 08/09/2010  . Hypertension   . High cholesterol   . Anginal pain   . Obstructive sleep apnea on CPAP 2011  . Anxiety   . GERD (gastroesophageal reflux disease)   . CHF (congestive heart failure)   . Acute renal failure      Intractable nausea vomiting secondary to diabetic gastroparesis causing dehydration and acute renal failure /notes 04/01/2013  . Liver mass     biopsied 03/2013  at [facility], not malignant.  is to undergo radiologic ablation of the mass in sept/October 2014.   . On home oxygen therapy     "2L 24/7" (02/28/2015)  . Migraines     "maybe a couple times/yr" (04/01/2013)  . Stroke 2010; 10/2012    "left side is still weak from it, never fully regained full strength; no additions from stroke 10/2012"  . Obesity   . Family history of malignant neoplasm of breast   . Family history of malignant neoplasm of ovary   . Chest pain 01/13/2014  . Diabetic gastroparesis associated with type 2 diabetes mellitus     this is presumed diagnoses, not confirmed by any studies.   . Shortness of breath   . SLE (systemic lupus erythematosus)      Archie Endo 12/01/2010  . CAP (community acquired pneumonia) 10/2011    Archie Endo 11/08/2011    Past Surgical History  Procedure Laterality Date  . Ectopic pregnancy surgery  1999  . Cardiac catheterization  02/07/12  . Hernia repair    . Esophagogastroduodenoscopy N/A 02/26/2013    Procedure: ESOPHAGOGASTRODUODENOSCOPY (EGD);  Surgeon: Irene Shipper, MD;  Location: [facility] ENDOSCOPY;  Service: Endoscopy;  Laterality: N/A;  . Breast cyst excision Left 08/2005    epidermoid  . Left and right heart catheterization with coronary angiogram N/A 02/07/2012    Procedure: LEFT AND RIGHT HEART CATHETERIZATION WITH CORONARY ANGIOGRAM;  Surgeon: Laverda Page, MD;  Location: [facility] CATH LAB;  Service: Cardiovascular;  Laterality: N/A;  . Umbilical hernia repair  1980's  . Muscle biopsy      for lupus/notes 12/01/2010  . Liver biopsy  03/2013    liver mass/medical hx noted above    Prior to Admission medications   Medication Sig Start Date End Date Taking? Authorizing Provider  Albiglutide (TANZEUM) 30 MG PEN Inject 30 mg into the skin once a week. Sundays   Yes Historical Provider, MD  albuterol (PROAIR HFA) 108 (90 BASE) MCG/ACT inhaler Inhale 2 puffs into the lungs every 6 (six) hours as needed for wheezing or shortness of breath.    Yes Historical Provider, MD  albuterol (PROVENTIL) (5 MG/ML) 0.5% nebulizer solution Take 2.5 mg by nebulization every 6 (six) hours as needed for wheezing or shortness of breath.  06/24/14  Yes Historical Provider, MD  alendronate (FOSAMAX) 70 MG tablet Take 70 mg by mouth every Saturday. Take with a full glass of water on an empty stomach.   Yes Historical Provider, MD  aspirin EC 81 MG tablet Take 81 mg by mouth daily.   Yes Historical Provider, MD  azaTHIOprine (IMURAN) 50 MG tablet Take 100 mg by mouth 2 (two) times daily.    Yes Historical Provider, MD  carvedilol (COREG) 3.125 MG tablet Take 3.125 mg by mouth 2 (two) times daily with a meal.   Yes Historical Provider, MD  cetirizine  (ZYRTEC) 10 MG tablet Take 10 mg by mouth daily.   Yes Historical Provider, MD  citalopram (CELEXA) 10 MG tablet Take 1 tablet (10 mg total) by mouth every evening. 04/06/13  Yes Belkys A Regalado, MD  clopidogrel (PLAVIX) 75 MG tablet Take 75 mg by mouth daily.   Yes Historical Provider, MD  cycloSPORINE (RESTASIS) 0.05 % ophthalmic emulsion Place 1 drop into both eyes 3 (three) times daily.   Yes Historical Provider, MD  famotidine (PEPCID) 20 MG tablet Take 20 mg by mouth 2 (two) times daily.   Yes Historical Provider, MD  fluorometholone (FML)  0.1 % ophthalmic suspension Place 1 drop into both eyes 2 (two) times daily. 02/11/15  Yes Historical Provider, MD  hydroxychloroquine (PLAQUENIL) 200 MG tablet Take 200 mg by mouth 2 (two) times daily.    Yes Historical Provider, MD  insulin regular human CONCENTRATED (HUMULIN R) 500 UNIT/ML SOLN injection INJECT 0.12 MLS (60 UNITS TOTAL) INTO THE SKIN 3 (THREE) TIMES DAILY WITH MEALS. 12/12/14  Yes Renato Shin, MD  isosorbide-hydrALAZINE (BIDIL) 20-37.5 MG per tablet Take 1 tablet by mouth 3 (three) times daily.   Yes Historical Provider, MD  LORazepam (ATIVAN) 1 MG tablet Take 1 tablet (1 mg total) by mouth every 6 (six) hours as needed for anxiety. 11/22/14  Yes Montine Circle, PA-C  losartan (COZAAR) 100 MG tablet Take 100 mg by mouth daily. 06/18/14  Yes Historical Provider, MD  megestrol (MEGACE) 40 MG tablet Take 40 mg by mouth daily. 01/30/15  Yes Historical Provider, MD  metFORMIN (GLUCOPHAGE-XR) 500 MG 24 hr tablet Take 1,000 mg by mouth 2 (two) times daily.   Yes Historical Provider, MD  morphine (MSIR) 15 MG tablet Take 30 mg by mouth every evening.    Yes Historical Provider, MD  nitroGLYCERIN (NITROSTAT) 0.4 MG SL tablet Place 0.4 mg under the tongue every 5 (five) minutes x 3 doses as needed for chest pain.  02/26/15 02/26/16 Yes Historical Provider, MD  NUVARING 0.12-0.015 MG/24HR vaginal ring Place 1 each vaginally every 21 ( twenty-one) days.   01/28/15  Yes Historical Provider, MD  PAZEO 0.7 % SOLN Place 1 drop into both eyes every morning. 02/11/15  Yes Historical Provider, MD  Polyvinyl Alcohol-Povidone (REFRESH OP) Place 1 drop into both eyes 2 (two) times daily.   Yes Historical Provider, MD  potassium chloride (MICRO-K) 10 MEQ CR capsule Take 10 mEq by mouth 2 (two) times daily.  08/21/14  Yes Historical Provider, MD  predniSONE (DELTASONE) 5 MG tablet Take 5 mg by mouth daily. 02/25/15  Yes Historical Provider, MD  pregabalin (LYRICA) 75 MG capsule Take 75 mg by mouth 2 (two) times daily. 01/28/15  Yes Historical Provider, MD  torsemide (DEMADEX) 20 MG tablet Take 1 tablet (20 mg total) by mouth 2 (two) times daily. 04/06/13  Yes Belkys A Regalado, MD    Scheduled Meds: . alum & mag hydroxide-simeth  30 mL Oral TID  . amLODipine  10 mg Oral Daily  . azaTHIOprine  100 mg Oral BID  . carvedilol  3.125 mg Oral BID WC  . citalopram  10 mg Oral QPM  . clopidogrel  75 mg Oral Daily  . cycloSPORINE  1 drop Both Eyes TID  . enoxaparin (LOVENOX) injection  90 mg Subcutaneous QHS  . hydroxychloroquine  200 mg Oral BID  . insulin aspart  0-9 Units Subcutaneous Q4H  . isosorbide-hydrALAZINE  1 tablet Oral TID  . pantoprazole (PROTONIX) IV  40 mg Intravenous Q12H   Infusions: . dextrose 5 % and 0.45% NaCl 75 mL/hr at 03/02/15 0658   PRN Meds: hydrALAZINE, LORazepam, morphine injection, ondansetron (ZOFRAN) IV, promethazine   Allergies as of 02/27/2015 - Review Complete 02/27/2015  Allergen Reaction Noted  . Metoclopramide Other (See Comments) 04/25/2013  . Oxycodone-acetaminophen Hives and Rash 08/09/2010  . Codeine Itching, Rash, and Other (See Comments) 05/09/2013    Family History  Problem Relation Age of Onset  . Diabetes Mother   . Hypertension Other   . Stroke Other   . Arthritis Other   . Ovarian cancer Sister 31  maternal half-sister; RAD51C positive  . Breast cancer Maternal Aunt 85    currently 15  . Stomach  cancer Maternal Uncle     dx 39s; deceased  . Cancer Paternal Aunt     unk. primary; deceased 81s  . Prostate cancer Paternal Uncle     deceased 8s  . Breast cancer Cousin     deceased 87s; daughter of mat uncle with stomach ca  . Prostate cancer Maternal Uncle     deceased 65s  . Breast cancer Maternal Aunt     deceased 15s  . Cancer Maternal Aunt     unk. primary  . Cancer Maternal Aunt     unk. primary  . Ovarian cancer Maternal Aunt     deceased 35s    History   Social History  . Marital Status: Single    Spouse Name: N/A  . Number of Children: N/A  . Years of Education: N/A   Occupational History  .      student   Social History Main Topics  . Smoking status: Former Smoker -- 3 years    Types: Cigarettes    Quit date: 06/01/1990  . Smokeless tobacco: Never Used  . Alcohol Use: No  . Drug Use: No  . Sexual Activity:    Partners: Male    Birth Control/ Protection: None   Other Topics Concern  . Not on file   Social History Narrative   Regular exercise-yes    REVIEW OF SYSTEMS: Constitutional:  No falls, generally no weakness.  ENT:  No nose bleeds Pulm:  No new SOB or cough CV:  No palpitations, no LE edema. No chest pain GU:  No hematuria, no frequency GI:  Per HPI.   Heme:  No unusual bleeding or bruising   Transfusions:  None in records.  Neuro:  No headaches, no peripheral tingling or numbness Derm:  No itching, no rash or sores.  Endocrine:  No sweats or chills.  No polyuria or dysuria.   Immunization:  Not queried.  Travel:  None beyond local counties in last few months.    PHYSICAL EXAM: Vital signs in last 24 hours: Filed Vitals:   03/02/15 0529  BP: 125/62  Pulse: 106  Temp: 98.9 F (37.2 C)  Resp: 18   Wt Readings from Last 3 Encounters:  02/27/15 405 lb (183.707 kg)  01/26/15 398 lb 13 oz (180.9 kg)  11/21/14 401 lb 3 oz (181.977 kg)    General: Super obese, uncomfortable, moaning, AAF. Head:  Cushingoid faces  Eyes:   No icterus or jaundice Ears:  Not HOH.  Nose:  No congestion, no discharge. Mouth:  Moist, clear MM. Neck:  Obese. Do not appreciate masses, thyromegaly or JVD. Lungs:  Greatly diminished breath sounds bilaterally. No dyspnea, no cough. No adventitious sounds. Heart: Regular rate, slightly tachycardia. No MRG. S1/S2 audible. Abdomen:  Obese, soft, hypoactive bowel sounds. No masses or organomegaly appreciated. Patient's exam relatively benign though she says there is tenderness to touch. No guarding or rebound..   Rectal: Deferred   Musc/Skeltl: No obvious joint contractures, swelling, or erythema Extremities:  No edema  Neurologic:  Oriented 3. Somewhat drowsy. Moves all 4 limbs. No tremor Skin:  No skin lesions, rashes. Tattoos:  None seen Nodes:  No cervical adenopathy.   Psych:  Anxious, urgently repeating her request for symptom relief of her abdominal pain.  LAB RESULTS:  Recent Labs  02/27/15 1746 02/28/15 0423  WBC 9.6 12.8*  HGB 10.5*  10.9*  HCT 33.4* 34.5*  PLT 293 314   BMET Lab Results  Component Value Date   NA 135 02/28/2015   NA 137 02/27/2015   NA 139 01/27/2015   K 3.8 02/28/2015   K 3.7 02/27/2015   K 4.1 01/27/2015   CL 102 02/28/2015   CL 105 02/27/2015   CL 109 01/27/2015   CO2 23 02/28/2015   CO2 25 02/27/2015   CO2 25 01/27/2015   GLUCOSE 174* 02/28/2015   GLUCOSE 171* 02/27/2015   GLUCOSE 128* 01/27/2015   BUN 6 02/28/2015   BUN 6 02/27/2015   BUN 17 01/27/2015   CREATININE 0.81 02/28/2015   CREATININE 0.93 02/27/2015   CREATININE 0.98 01/27/2015   CALCIUM 8.4* 02/28/2015   CALCIUM 8.4* 02/27/2015   CALCIUM 7.9* 01/27/2015   LFT  Recent Labs  02/27/15 1746 02/28/15 0423  PROT 6.7 7.1  ALBUMIN 2.9* 3.0*  AST 26 104*  ALT 18 59*  ALKPHOS 67 76  BILITOT 0.2* 0.5   PT/INR Lab Results  Component Value Date   INR 0.94 01/25/2015   INR 0.90 03/05/2014   INR 1.01 11/11/2013      Component Value Date/Time   LIPASE 28  02/27/2015 1746    ENDOSCOPIC STUDIES: See HPI  IMPRESSION:   *  Nausea, vomiting, abdominal pain. Not well controlled with current meds.  Reflux esophagitis noted on July/2014 EGD. Outpatient acid suppression coverage with famotidine  *  Normocytic anemia, chronic.  Hgb drop of 2 grams in 24 hours likely reflects hydration.  Currnel Hgb is within historic range of 9.5 to 10.5.Marland Kitchen   *  Sjogren's syndrome, lupus. On chronic low-dose prednisone.  *  Status post ablation of benign liver lesion.    PLAN:     *  Per Dr Ardis Hughs:  EGD today.   Azucena Freed  03/02/2015, 8:40 AM Pager: [phone]   ________________________________________________________________________  Velora Heckler GI MD note:  I personally examined the patient, reviewed the data and agree with the assessment and plan described above.  Seems most likely GERD related, chronic illness and massive obesity contribute. Planning on EGD today.    Owens Loffler, MD [facility] Pager [phone]

## 2015-03-02 NOTE — Anesthesia Preprocedure Evaluation (Signed)
Anesthesia Evaluation  Patient identified by MRN, date of birth, ID band Patient awake    Reviewed: Allergy & Precautions, NPO status , Patient's Chart, lab work & pertinent test results  Airway Mallampati: II  TM Distance: >3 FB Neck ROM: Full    Dental no notable dental hx.    Pulmonary shortness of breath, sleep apnea , pneumonia -, former smoker,  breath sounds clear to auscultation  Pulmonary exam normal       Cardiovascular hypertension, Pt. on medications + angina +CHF Normal cardiovascular examRhythm:Regular Rate:Normal     Neuro/Psych  Headaches, PSYCHIATRIC DISORDERS Anxiety  Neuromuscular disease CVA, Residual Symptoms    GI/Hepatic Neg liver ROS, GERD-  ,  Endo/Other  diabetes, Type 2, Oral Hypoglycemic Agents  Renal/GU Renal diseasenegative Renal ROS     Musculoskeletal negative musculoskeletal ROS (+)   Abdominal (+) + obese,   Peds  Hematology  (+) anemia ,   Anesthesia Other Findings   Reproductive/Obstetrics negative OB ROS                             Anesthesia Physical Anesthesia Plan  ASA: IV  Anesthesia Plan: General   Post-op Pain Management:    Induction: Intravenous, Rapid sequence and Cricoid pressure planned  Airway Management Planned: Oral ETT  Additional Equipment: None  Intra-op Plan:   Post-operative Plan: Extubation in OR  Informed Consent: I have reviewed the patients History and Physical, chart, labs and discussed the procedure including the risks, benefits and alternatives for the proposed anesthesia with the patient or authorized representative who has indicated his/her understanding and acceptance.   Dental advisory given  Plan Discussed with: CRNA  Anesthesia Plan Comments:         Anesthesia Quick Evaluation

## 2015-03-02 NOTE — Anesthesia Postprocedure Evaluation (Signed)
Anesthesia Post Note  Patient: [name]  Procedure(s) Performed: Procedure(s) (LRB): ESOPHAGOGASTRODUODENOSCOPY (EGD) (N/A)  Anesthesia type: General  Patient location: PACU  Post pain: Pain level controlled  Post assessment: Post-op Vital signs reviewed  Last Vitals: BP 200/101 mmHg  Pulse 100  Temp(Src) 37.2 C (Oral)  Resp 21  Ht 5\' 8"  (1.727 m)  Wt 405 lb (183.707 kg)  BMI 61.59 kg/m2  SpO2 97%  LMP   Post vital signs: Reviewed  Level of consciousness: sedated  Complications: No apparent anesthesia complications

## 2015-03-02 NOTE — Anesthesia Procedure Notes (Signed)
Procedure Name: Intubation Date/Time: 03/02/2015 11:25 AM Performed by: Suzy Bouchard Pre-anesthesia Checklist: Patient identified, Emergency Drugs available, Timeout performed, Patient being monitored and Suction available Patient Re-evaluated:Patient Re-evaluated prior to inductionOxygen Delivery Method: Circle system utilized Preoxygenation: Pre-oxygenation with 100% oxygen Intubation Type: IV induction, Cricoid Pressure applied and Rapid sequence Laryngoscope Size: Miller and 2 Grade View: Grade I Tube type: Oral Number of attempts: 1 Airway Equipment and Method: Stylet Placement Confirmation: ETT inserted through vocal cords under direct vision,  positive ETCO2 and breath sounds checked- equal and bilateral Secured at: 22 cm Tube secured with: Tape Dental Injury: Teeth and Oropharynx as per pre-operative assessment

## 2015-03-02 NOTE — Progress Notes (Signed)
Patient Demographics:    [name], is a 44 y.o. female, DOB - 1971/07/02, IRJ:[id number]  Admit date - 02/27/2015   Admitting Physician Reubin Milan, MD  Outpatient Primary MD for the patient is Elyn Peers, MD  LOS - 3   Chief Complaint  Patient presents with  . Emesis  . Abdominal Pain        Subjective:    [name] today has, No headache, No chest pain, No new weakness tingling or numbness, No Cough - SOB.   Complains of ongoing nausea and minimal epigastric abdominal pain, complains of reflux. Remains lying in bed has been requested to sit up in the recliner to help with reflux symptoms.   Assessment  & Plan :    1. Abdominal pain nonspecific with nausea vomiting in a patient with diabetic gastroparesis. Appears to be in no distress, could have mild gastroenteritis on top of her gastroparesis. No diarrhea. KUB unremarkable. Right upper quadrant ultrasound unremarkable.   Abdominal exam remains benign and she continues to exhibit narcotic seeking behavior however she does give symptoms suggestive of reflux as well, GI has been consulted on 03/01/2015. Likely will require EGD as well. Has been on IV PPI along with supportive care for gastroparesis and nausea.   2. History of lupus/Sjogren syndrome. Stable. Home regimen restarted.    3. Essential hypertension - Resumed home dose Lopressor have added Norvasc for better control. Will increase home dose hydralazine continue Imdur, Hold diuretic, ACE/ARB for now.   4. History of chronic diastolic heart failure. Last EF one year ago 55%, currently compensated.    5. GERD. On IV PPI dose increased   6. Morbid obesity. Follow with PCP post discharge.   7. Chronic pain. Home medications continued, IV morphine for breakthrough.     8. DM type II. Glucophage on hold place on sliding scale.   Lab Results  Component Value Date   HGBA1C 7.1* 01/25/2015    CBG (last 3)   Recent Labs  03/01/15 2344 03/02/15 0354 03/02/15 0802  GLUCAP 179* 157* 178*        Code Status : Full code  Family Communication  : None present  Disposition Plan  : Home later today or early tomorrow  Consults  :  None  Procedures  :  KUB nonacute, right upper quadrant ultrasound stable  DVT Prophylaxis  :  Lovenox   Lab Results  Component Value Date   PLT 314 02/28/2015    Inpatient Medications  Scheduled Meds: . alum & mag hydroxide-simeth  30 mL Oral TID  . amLODipine  10 mg Oral Daily  . azaTHIOprine  100 mg Oral BID  . carvedilol  3.125 mg Oral BID WC  . citalopram  10 mg Oral QPM  . clopidogrel  75 mg Oral Daily  . cycloSPORINE  1 drop Both Eyes TID  . enoxaparin (LOVENOX) injection  90 mg Subcutaneous QHS  . hydroxychloroquine  200 mg Oral BID  . insulin aspart  0-9 Units Subcutaneous Q4H  . isosorbide-hydrALAZINE  1 tablet Oral TID  . pantoprazole (PROTONIX) IV  40 mg Intravenous Q12H   Continuous Infusions: . sodium chloride    . dextrose 5 % and 0.45% NaCl 75 mL/hr at  03/02/15 1478  . lactated ringers 1,000 mL (03/02/15 1047)   PRN Meds:.hydrALAZINE, LORazepam, morphine injection, ondansetron (ZOFRAN) IV, promethazine  Antibiotics  :     Anti-infectives    Start     Dose/Rate Route Frequency Ordered Stop   02/28/15 1300  hydroxychloroquine (PLAQUENIL) tablet 200 mg     200 mg Oral 2 times daily 02/28/15 1133          Objective:   Filed Vitals:   03/01/15 2253 03/01/15 2305 03/02/15 0529 03/02/15 1039  BP: 104/32 128/57 125/62 187/91  Pulse: 105 120 106   Temp:   98.9 F (37.2 C) 98.5 F (36.9 C)  TempSrc:   Oral Oral  Resp:   18 22  Height:      Weight:      SpO2:   96% 100%    Wt Readings from Last 3 Encounters:  02/27/15 183.707 kg (405 lb)  01/26/15 180.9 kg (398 lb 13  oz)  11/21/14 181.977 kg (401 lb 3 oz)     Intake/Output Summary (Last 24 hours) at 03/02/15 1051 Last data filed at 03/02/15 1049  Gross per 24 hour  Intake 1181.25 ml  Output      0 ml  Net 1181.25 ml     Physical Exam  Awake Alert, Oriented X 3, No new F.N deficits, Normal affect [place].AT,PERRAL Supple Neck,No JVD, No cervical lymphadenopathy appriciated.  Symmetrical Chest wall movement, Good air movement bilaterally, CTAB RRR,No Gallops,Rubs or new Murmurs, No Parasternal Heave +ve B.Sounds, Abd Soft, No tenderness, No organomegaly appriciated, No rebound - guarding or rigidity. No Cyanosis, Clubbing or edema, No new Rash or bruise       Data Review:   Micro Results No results found for this or any previous visit (from the past 240 hour(s)).  Radiology Reports Dg Abd Portable 1v  02/28/2015   CLINICAL DATA:  Abdominal pain, nausea and vomiting for 1 day.  EXAM: PORTABLE ABDOMEN - 1 VIEW  COMPARISON:  11/11/2013 radiographs  FINDINGS: The bowel gas pattern is normal. No radio-opaque calculi or other significant radiographic abnormality are seen.  IMPRESSION: Negative.   Electronically Signed   By: Margarette Canada M.D.   On: 02/28/2015 07:24   US Abdomen Limited Ruq  02/27/2015   CLINICAL DATA:  Hypertension.  Nausea and vomiting  EXAM: US ABDOMEN LIMITED - RIGHT UPPER QUADRANT  COMPARISON:  CT 04/27/2014  FINDINGS: Gallbladder:  No gallstones or wall thickening visualized. Patient patient heavily medicated. Sonographic Murphy's sign could not be assessed.  Common bile duct:  Diameter: 4 mm  Liver:  No focal lesion identified. Within normal limits in parenchymal echogenicity.  IMPRESSION: No evidence of cholecystitis.   Electronically Signed   By: Suzy Bouchard M.D.   On: 02/27/2015 20:48     CBC  Recent Labs Lab 02/27/15 1746 02/28/15 0423  WBC 9.6 12.8*  HGB 10.5* 10.9*  HCT 33.4* 34.5*  PLT 293 314  MCV 84.8 82.7  MCH 26.6 26.1  MCHC 31.4 31.6  RDW 15.3 15.1   LYMPHSABS 2.1 1.5  MONOABS 0.7 0.8  EOSABS 0.1 0.0  BASOSABS 0.0 0.0    Chemistries   Recent Labs Lab 02/27/15 1746 02/28/15 0423  NA 137 135  K 3.7 3.8  CL 105 102  CO2 25 23  GLUCOSE 171* 174*  BUN 6 6  CREATININE 0.93 0.81  CALCIUM 8.4* 8.4*  AST 26 104*  ALT 18 59*  ALKPHOS 67 76  BILITOT 0.2* 0.5   ------------------------------------------------------------------------------------------------------------------  estimated creatinine clearance is 156.4 mL/min (by C-G formula based on Cr of 0.81). ------------------------------------------------------------------------------------------------------------------ No results for input(s): HGBA1C in the last 72 hours. ------------------------------------------------------------------------------------------------------------------ No results for input(s): CHOL, HDL, LDLCALC, TRIG, CHOLHDL, LDLDIRECT in the last 72 hours. ------------------------------------------------------------------------------------------------------------------ No results for input(s): TSH, T4TOTAL, T3FREE, THYROIDAB in the last 72 hours.  Invalid input(s): FREET3 ------------------------------------------------------------------------------------------------------------------ No results for input(s): VITAMINB12, FOLATE, FERRITIN, TIBC, IRON, RETICCTPCT in the last 72 hours.  Coagulation profile No results for input(s): INR, PROTIME in the last 168 hours.  No results for input(s): DDIMER in the last 72 hours.  Cardiac Enzymes No results for input(s): CKMB, TROPONINI, MYOGLOBIN in the last 168 hours.  Invalid input(s): CK ------------------------------------------------------------------------------------------------------------------ Invalid input(s): POCBNP   Time Spent in minutes  35   SINGH,PRASHANT K M.D on 03/02/2015 at 10:51 AM  Between 7am to 7pm - Pager - [phone]  After 7pm go to [email or web address] - password [facility]  [facility] -  Office  [phone]

## 2015-03-02 NOTE — Op Note (Signed)
[facility] [place], [id number]   ENDOSCOPY PROCEDURE REPORT  PATIENT: [name], [name]  MR#: [id number] BIRTHDATE: [birth year] , 44  yrs. old GENDER: female ENDOSCOPIST: Milus Banister, MD PROCEDURE DATE:  03/02/2015 PROCEDURE:  EGD, diagnostic ASA CLASS:     Class IV INDICATIONS:  nausea, vomiting. MEDICATIONS: Per Anesthesia TOPICAL ANESTHETIC: none  DESCRIPTION OF PROCEDURE: After the risks benefits and alternatives of the procedure were thoroughly explained, informed consent was obtained.  The Pentax Gastroscope [id number] endoscope was introduced through the mouth and advanced to the second portion of the duodenum , Without limitations.  The instrument was slowly withdrawn as the mucosa was fully examined.   EXAM: The esophagus and gastroesophageal junction were completely normal in appearance.  The stomach was entered and closely examined.The antrum, angularis, and lesser curvature were well visualized, including a retroflexed view of the cardia and fundus. The stomach wall was normally distensable.  The scope passed easily through the pylorus into the duodenum.  Retroflexed views revealed no abnormalities.     The scope was then withdrawn from the patient and the procedure completed. COMPLICATIONS: There were no immediate complications.  ENDOSCOPIC IMPRESSION: Normal appearing esophagus and GE junction, the stomach was well visualized and normal in appearance, normal appearing duodenum  RECOMMENDATIONS: Your symptoms may be GERD related.  Should continue at least once daily PPI (such as protonix that was started in hospital), best taken 20-30 min prior to breakfast meal. Follow up with GI as needed.  eSigned:  Milus Banister, MD 03/02/2015 11:39 AM

## 2015-03-02 NOTE — Interval H&P Note (Signed)
History and Physical Interval Note:  03/02/2015 11:06 AM  [name]  has presented today for surgery, with the diagnosis of Nausea, vomiting, abdominal pain.  The various methods of treatment have been discussed with the patient and family. After consideration of risks, benefits and other options for treatment, the patient has consented to  Procedure(s): ESOPHAGOGASTRODUODENOSCOPY (EGD) (N/A) as a surgical intervention .  The patient's history has been reviewed, patient examined, no change in status, stable for surgery.  I have reviewed the patient's chart and labs.  Questions were answered to the patient's satisfaction.     Milus Banister

## 2015-03-03 ENCOUNTER — Encounter (HOSPITAL_COMMUNITY): Payer: Self-pay | Admitting: Gastroenterology

## 2015-03-03 DIAGNOSIS — R1013 Epigastric pain: Secondary | ICD-10-CM | POA: Insufficient documentation

## 2015-03-03 DIAGNOSIS — IMO0002 Reserved for concepts with insufficient information to code with codable children: Secondary | ICD-10-CM | POA: Insufficient documentation

## 2015-03-03 DIAGNOSIS — R52 Pain, unspecified: Secondary | ICD-10-CM

## 2015-03-03 DIAGNOSIS — M3500 Sicca syndrome, unspecified: Secondary | ICD-10-CM | POA: Insufficient documentation

## 2015-03-03 DIAGNOSIS — M329 Systemic lupus erythematosus, unspecified: Secondary | ICD-10-CM

## 2015-03-03 LAB — CBC
HEMATOCRIT: 32.7 % — AB (ref 36.0–46.0)
Hemoglobin: 10.7 g/dL — ABNORMAL LOW (ref 12.0–15.0)
MCH: 27.2 pg (ref 26.0–34.0)
MCHC: 32.7 g/dL (ref 30.0–36.0)
MCV: 83.2 fL (ref 78.0–100.0)
Platelets: 310 10*3/uL (ref 150–400)
RBC: 3.93 MIL/uL (ref 3.87–5.11)
RDW: 15.4 % (ref 11.5–15.5)
WBC: 12.4 10*3/uL — ABNORMAL HIGH (ref 4.0–10.5)

## 2015-03-03 LAB — BASIC METABOLIC PANEL
ANION GAP: 9 (ref 5–15)
BUN: 9 mg/dL (ref 6–20)
CHLORIDE: 103 mmol/L (ref 101–111)
CO2: 23 mmol/L (ref 22–32)
CREATININE: 0.95 mg/dL (ref 0.44–1.00)
Calcium: 8.4 mg/dL — ABNORMAL LOW (ref 8.9–10.3)
GFR calc Af Amer: >60 mL/min (ref >60)
GFR calc non Af Amer: >60 mL/min (ref >60)
Glucose, Bld: 150 mg/dL — ABNORMAL HIGH (ref 65–99)
Potassium: 3.6 mmol/L (ref 3.5–5.1)
SODIUM: 135 mmol/L (ref 135–145)

## 2015-03-03 LAB — GLUCOSE, CAPILLARY
GLUCOSE-CAPILLARY: 150 mg/dL — AB (ref 65–99)
GLUCOSE-CAPILLARY: 150 mg/dL — AB (ref 65–99)
Glucose-Capillary: 142 mg/dL — ABNORMAL HIGH (ref 65–99)
Glucose-Capillary: 158 mg/dL — ABNORMAL HIGH (ref 65–99)
Glucose-Capillary: 158 mg/dL — ABNORMAL HIGH (ref 65–99)

## 2015-03-03 MED ORDER — WHITE PETROLATUM GEL
Status: AC
  Administered 2015-03-03: 0.2
  Filled 2015-03-03: qty 1

## 2015-03-03 MED ORDER — PROMETHAZINE HCL 25 MG/ML IJ SOLN
12.5000 mg | Freq: Once | INTRAMUSCULAR | Status: AC
  Administered 2015-03-03: 12.5 mg via INTRAVENOUS

## 2015-03-03 MED ORDER — MORPHINE SULFATE 2 MG/ML IJ SOLN
1.0000 mg | Freq: Once | INTRAMUSCULAR | Status: AC
  Administered 2015-03-03: 1 mg via INTRAVENOUS

## 2015-03-03 MED ORDER — PROMETHAZINE HCL 25 MG/ML IJ SOLN
6.2500 mg | INTRAMUSCULAR | Status: DC | PRN
Start: 2015-03-03 — End: 2015-03-03

## 2015-03-03 MED ORDER — MEPERIDINE HCL 25 MG/ML IJ SOLN: 6.2500 mg | INTRAMUSCULAR | Status: DC | PRN

## 2015-03-03 NOTE — Progress Notes (Signed)
Patient found kneeling at bedside on both knees states she fell and cannot get up. Pt. States she was attempting to fix her bed covers and became weak and slipped to her knees. Pt. Assisted back to bed x2 assist. Pt. Assessed, v/s assessed, no acute injuries or pain noted. MD paged and made aware. No new orders rec'd. Fall safety huddle completed via Camera operator. Bed alarm initiated, and pt. Instructed to call prior to getting out of bed. Phone also placed in reach with call bell and pt. Re-oriented to call light and nurse and nurse tech phone numbers.

## 2015-03-03 NOTE — Progress Notes (Addendum)
   03/02/15 1825  What Happened  Was fall witnessed? No  Was patient injured? No  Patient found on floor (on her knees)  Found by Staff-comment (pt. called the NS for someone to help her up)  Stated prior activity other (comment) (trying to fix her sheets on the bed)  Follow Up  MD notified Dr. Candiss Norse  Time MD notified [phone] (MD paged 2 times)  Family notified No- patient refusal  Additional tests No  Simple treatment (none)  Adult Fall Risk Assessment  Risk Factor Category (scoring not indicated) Fall has occurred during this admission (document High fall risk)  Age 44  Fall History: Fall within 6 months prior to admission 0  Elimination; Bowel and/or Urine Incontinence 0  Elimination; Bowel and/or Urine Urgency/Frequency 0  Medications: includes PCA/Opiates, Anti-convulsants, Anti-hypertensives, Diuretics, Hypnotics, Laxatives, Sedatives, and Psychotropics 7  Patient Care Equipment 1  Mobility-Assistance 0  Mobility-Gait 0  Mobility-Sensory Deficit 0  Cognition-Awareness 0  Cognition-Impulsiveness 0  Cognition-Limitations 0  Total Score 8  Patient's Fall Risk High Fall Risk (>13 points)  Adult Fall Risk Interventions  Required Bundle Interventions *See Row Information* High fall risk - low, moderate, and high requirements implemented  Additional Interventions Fall risk signage;Individualized elimination schedule;Room near nurses station;Secure all tubes/drains;Use of appropriate toileting equipment (bedpan, BSC, etc.)  Fall with Injury Screening  Risk For Fall Injury- See Row Information  None identified  Pain Assessment  Pain Assessment 0-10  Pain Score 0  PCA/Epidural/Spinal Assessment  Respiratory Pattern Regular  Neurological  Neuro (WDL) WDL  Musculoskeletal  Musculoskeletal (WDL) X  Assistive Device None  Generalized Weakness Yes  Integumentary  Integumentary (WDL) WDL  Pain Assessment  Date Pain First Started (n/a)

## 2015-03-03 NOTE — Progress Notes (Signed)
[facility] Progress Note    Since last GI note: EGD yesterday, full report in chart. Was normal.  Her nausea is slightly better this morning, still really not interested in eating however.    Objective: Vital signs in last 24 hours: Temp:  [98.5 F (36.9 C)-99.4 F (37.4 C)] 98.8 F (37.1 C) (08/02 0531) Pulse Rate:  [96-119] 97 (08/02 0531) Resp:  [18-22] 18 (08/02 0531) BP: (144-209)/(54-104) 153/86 mmHg (08/02 0531) SpO2:  [93 %-100 %] 93 % (08/02 0531) Last BM Date: 02/27/15  Morbid obesity General: alert and oriented times 3 Heart: regular rate and rythm Abdomen: soft, non-tender, non-distended, normal bowel sounds   Lab Results:  Recent Labs  03/03/15 0544  WBC 12.4*  HGB 10.7*  PLT 310  MCV 83.2    Recent Labs  03/03/15 0544  NA 135  K 3.6  CL 103  CO2 23  GLUCOSE 150*  BUN 9  CREATININE 0.95  CALCIUM 8.4*   Studies/Results: Dg Abd Portable 1v  03/02/2015   CLINICAL DATA:  Nausea  EXAM: PORTABLE ABDOMEN - 1 VIEW  COMPARISON:  February 28, 2015  FINDINGS: There is no bowel dilatation or air-fluid level suggesting obstruction. No free air is seen on this supine examination. There is moderate stool in the colon. No abnormal calcifications. Lung bases are clear.  IMPRESSION: Bowel gas pattern unremarkable.   Electronically Signed   By: Lowella Grip III M.D.   On: 03/02/2015 15:25     Medications: Scheduled Meds: . alum & mag hydroxide-simeth  30 mL Oral TID  . amLODipine  10 mg Oral Daily  . azaTHIOprine  100 mg Oral BID  . carvedilol  3.125 mg Oral BID WC  . citalopram  10 mg Oral QPM  . clopidogrel  75 mg Oral Daily  . cycloSPORINE  1 drop Both Eyes TID  . enoxaparin (LOVENOX) injection  90 mg Subcutaneous QHS  . erythromycin  500 mg Intravenous 4 times per day  . hydrALAZINE  50 mg Oral 3 times per day  . hydroxychloroquine  200 mg Oral BID  . insulin aspart  0-9 Units Subcutaneous Q4H  . nitroGLYCERIN  1 inch Topical 4 times  per day  . ondansetron (ZOFRAN) IV  8 mg Intravenous TID  . pantoprazole  40 mg Oral BID AC   Continuous Infusions: . sodium chloride    . dextrose 5 % and 0.45% NaCl    . lactated ringers 1,000 mL (03/02/15 1047)   PRN Meds:.hydrALAZINE, LORazepam, morphine injection, promethazine    Assessment/Plan: 44 y.o. female with nausea, vomiting  Unclear etiology but somewhat improved since measures this admission (eryhromycin qid scheduled, zofran tid scheduled, PPI BID).  Encourage PO intake as tolerated.  #1 side effect of celexa is nausea, could consider changing or stopping.    Milus Banister, MD  03/03/2015, 7:46 AM [facility] Pager [phone]

## 2015-03-03 NOTE — Progress Notes (Signed)
Patient Demographics:    [name], is a 44 y.o. female, DOB - 1971-07-24, CVE:[id number]  Admit date - 02/27/2015   Admitting Physician Reubin Milan, MD  Outpatient Primary MD for the patient is Elyn Peers, MD  LOS - 4   Summary  34 year old morbidly obese African-American female with history of diabetes mellitus type 2, lupus, diabetic gastroparesis, OSA was admitted to the hospital with epigastric abdominal discomfort and persistent nausea vomiting, GU B and right upper quadrant ultrasound were stable, she was seen by GI, placed on IV PPI and underwent unremarkable EGD. She had allergy to Reglan and finally was placed on IV erythromycin on 03/02/2015 with good effect. We'll start trial with liquid diet if better advance diet and discharge in the morning.    Chief Complaint  Patient presents with  . Emesis  . Abdominal Pain        Subjective:    Yaniyah Koors today has, No headache, No chest pain, No new weakness tingling or numbness, No Cough - SOB.   She  feels 50% better this morning with much improved nausea and abdominal discomfort and wants to try some liquids   Assessment  & Plan :    1. Abdominal pain nonspecific with nausea vomiting in a patient with diabetic gastroparesis. Appears to be in no distress, could have mild gastroenteritis on top of her gastroparesis. No diarrhea. KUB unremarkable. Right upper quadrant ultrasound unremarkable.   Seen by GI, placed on IV PPI, underwent EGD on 03/02/2015 which was unremarkable, clinically everything pointed towards gastroparesis, she could not be placed on Reglan due to allergy and was placed on IV erythromycin on 03/02/2015, she has responded well to erythromycin and feels much better today. Will start trial of clear liquids continue  supportive care with antinausea medication. She has been told that if she is able to tolerate soft diet will be discharged tomorrow.     2. History of lupus/Sjogren syndrome. Stable. Home regimen restarted.    3. Essential hypertension - Resumed home dose Lopressor have added Norvasc for better control. Will increase home dose hydralazine continue Imdur, Hold diuretic, ACE/ARB for now.   4. History of chronic diastolic heart failure. Last EF one year ago 55%, currently compensated.    5. GERD. On IV PPI dose increased   6. Morbid obesity. Follow with PCP post discharge.   7. Chronic pain. Home medications continued, IV morphine for breakthrough.    8. DM type II. Glucophage on hold place on sliding scale.   Lab Results  Component Value Date   HGBA1C 6.8* 02/28/2015    CBG (last 3)   Recent Labs  03/03/15 0011 03/03/15 0410 03/03/15 0830  GLUCAP 158* 150* 142*        Code Status : Full code  Family Communication  : None present  Disposition Plan  : Home later today or early tomorrow  Consults  :  None  Procedures  :  KUB nonacute, right upper quadrant ultrasound stable  EGD done 03/02/2015 unremarkable done by Dr. Ardis Hughs  DVT Prophylaxis  :  Lovenox   Lab Results  Component Value Date   PLT 310 03/03/2015    Inpatient Medications  Scheduled Meds: . alum & mag hydroxide-simeth  30 mL Oral TID  . amLODipine  10 mg Oral Daily  . azaTHIOprine  100 mg Oral BID  . carvedilol  3.125 mg Oral BID WC  . citalopram  10 mg Oral QPM  . clopidogrel  75 mg Oral Daily  . cycloSPORINE  1 drop Both Eyes TID  . enoxaparin (LOVENOX) injection  90 mg Subcutaneous QHS  . erythromycin  500 mg Intravenous 4 times per day  . hydrALAZINE  50 mg Oral 3 times per day  . hydroxychloroquine  200 mg Oral BID  . insulin aspart  0-9 Units Subcutaneous Q4H  . nitroGLYCERIN  1 inch Topical 4 times per day  . ondansetron (ZOFRAN) IV  8 mg Intravenous TID  . pantoprazole  40  mg Oral BID AC   Continuous Infusions: . sodium chloride    . dextrose 5 % and 0.45% NaCl    . lactated ringers 1,000 mL (03/02/15 1047)   PRN Meds:.hydrALAZINE, LORazepam, morphine injection, promethazine  Antibiotics  :     Anti-infectives    Start     Dose/Rate Route Frequency Ordered Stop   03/02/15 1600  erythromycin 500 mg in sodium chloride 0.9 % 100 mL IVPB     500 mg 100 mL/hr over 60 Minutes Intravenous 4 times per day 03/02/15 1447     02/28/15 1300  hydroxychloroquine (PLAQUENIL) tablet 200 mg     200 mg Oral 2 times daily 02/28/15 1133          Objective:   Filed Vitals:   03/02/15 2158 03/03/15 0009 03/03/15 0531 03/03/15 1059  BP: 186/96 154/79 153/86 157/91  Pulse: 97 104 97 91  Temp: 99.4 F (37.4 C)  98.8 F (37.1 C)   TempSrc: Oral  Oral   Resp: 20  18   Height:      Weight:      SpO2: 94% 93% 93%     Wt Readings from Last 3 Encounters:  02/27/15 183.707 kg (405 lb)  01/26/15 180.9 kg (398 lb 13 oz)  11/21/14 181.977 kg (401 lb 3 oz)    No intake or output data in the 24 hours ending 03/03/15 1108   Physical Exam  Awake Alert, Oriented X 3, No new F.N deficits, Normal affect [place].AT,PERRAL Supple Neck,No JVD, No cervical lymphadenopathy appriciated.  Symmetrical Chest wall movement, Good air movement bilaterally, CTAB RRR,No Gallops,Rubs or new Murmurs, No Parasternal Heave +ve B.Sounds, Abd Soft, No tenderness, No organomegaly appriciated, No rebound - guarding or rigidity. No Cyanosis, Clubbing or edema, No new Rash or bruise       Data Review:   Micro Results No results found for this or any previous visit (from the past 240 hour(s)).  Radiology Reports Dg Abd Portable 1v  03/02/2015   CLINICAL DATA:  Nausea  EXAM: PORTABLE ABDOMEN - 1 VIEW  COMPARISON:  February 28, 2015  FINDINGS: There is no bowel dilatation or air-fluid level suggesting obstruction. No free air is seen on this supine examination. There is moderate stool in the colon.  No abnormal calcifications. Lung bases are clear.  IMPRESSION: Bowel gas pattern unremarkable.   Electronically Signed   By: Lowella Grip III M.D.   On: 03/02/2015 15:25   Dg Abd Portable 1v  02/28/2015   CLINICAL DATA:  Abdominal pain, nausea and vomiting for 1 day.  EXAM: PORTABLE ABDOMEN - 1 VIEW  COMPARISON:  11/11/2013 radiographs  FINDINGS: The bowel gas pattern is normal. No radio-opaque calculi or other significant  radiographic abnormality are seen.  IMPRESSION: Negative.   Electronically Signed   By: Margarette Canada M.D.   On: 02/28/2015 07:24   US Abdomen Limited Ruq  02/27/2015   CLINICAL DATA:  Hypertension.  Nausea and vomiting  EXAM: US ABDOMEN LIMITED - RIGHT UPPER QUADRANT  COMPARISON:  CT 04/27/2014  FINDINGS: Gallbladder:  No gallstones or wall thickening visualized. Patient patient heavily medicated. Sonographic Murphy's sign could not be assessed.  Common bile duct:  Diameter: 4 mm  Liver:  No focal lesion identified. Within normal limits in parenchymal echogenicity.  IMPRESSION: No evidence of cholecystitis.   Electronically Signed   By: Suzy Bouchard M.D.   On: 02/27/2015 20:48     CBC  Recent Labs Lab 02/27/15 1746 02/28/15 0423 03/03/15 0544  WBC 9.6 12.8* 12.4*  HGB 10.5* 10.9* 10.7*  HCT 33.4* 34.5* 32.7*  PLT 293 314 310  MCV 84.8 82.7 83.2  MCH 26.6 26.1 27.2  MCHC 31.4 31.6 32.7  RDW 15.3 15.1 15.4  LYMPHSABS 2.1 1.5  --   MONOABS 0.7 0.8  --   EOSABS 0.1 0.0  --   BASOSABS 0.0 0.0  --     Chemistries   Recent Labs Lab 02/27/15 1746 02/28/15 0423 03/03/15 0544  NA 137 135 135  K 3.7 3.8 3.6  CL 105 102 103  CO2 25 23 23   GLUCOSE 171* 174* 150*  BUN 6 6 9   CREATININE 0.93 0.81 0.95  CALCIUM 8.4* 8.4* 8.4*  AST 26 104*  --   ALT 18 59*  --   ALKPHOS 67 76  --   BILITOT 0.2* 0.5  --    ------------------------------------------------------------------------------------------------------------------ estimated creatinine clearance is  133.4 mL/min (by C-G formula based on Cr of 0.95). ------------------------------------------------------------------------------------------------------------------  Recent Labs  02/28/15 1400  HGBA1C 6.8*   ------------------------------------------------------------------------------------------------------------------ No results for input(s): CHOL, HDL, LDLCALC, TRIG, CHOLHDL, LDLDIRECT in the last 72 hours. ------------------------------------------------------------------------------------------------------------------ No results for input(s): TSH, T4TOTAL, T3FREE, THYROIDAB in the last 72 hours.  Invalid input(s): FREET3 ------------------------------------------------------------------------------------------------------------------ No results for input(s): VITAMINB12, FOLATE, FERRITIN, TIBC, IRON, RETICCTPCT in the last 72 hours.  Coagulation profile No results for input(s): INR, PROTIME in the last 168 hours.  No results for input(s): DDIMER in the last 72 hours.  Cardiac Enzymes No results for input(s): CKMB, TROPONINI, MYOGLOBIN in the last 168 hours.  Invalid input(s): CK ------------------------------------------------------------------------------------------------------------------ Invalid input(s): POCBNP   Time Spent in minutes  35   Casondra Gasca K M.D on 03/03/2015 at 11:08 AM  Between 7am to 7pm - Pager - [phone]  After 7pm go to [email or web address] - password [facility]  [facility] -  Office  [phone]

## 2015-03-04 ENCOUNTER — Encounter (HOSPITAL_COMMUNITY): Payer: Self-pay | Admitting: Physician Assistant

## 2015-03-04 DIAGNOSIS — R1013 Epigastric pain: Secondary | ICD-10-CM

## 2015-03-04 DIAGNOSIS — I10 Essential (primary) hypertension: Secondary | ICD-10-CM

## 2015-03-04 DIAGNOSIS — Z862 Personal history of diseases of the blood and blood-forming organs and certain disorders involving the immune mechanism: Secondary | ICD-10-CM

## 2015-03-04 LAB — GLUCOSE, CAPILLARY
GLUCOSE-CAPILLARY: 113 mg/dL — AB (ref 65–99)
GLUCOSE-CAPILLARY: 128 mg/dL — AB (ref 65–99)
Glucose-Capillary: 132 mg/dL — ABNORMAL HIGH (ref 65–99)
Glucose-Capillary: 154 mg/dL — ABNORMAL HIGH (ref 65–99)
Glucose-Capillary: 172 mg/dL — ABNORMAL HIGH (ref 65–99)
Glucose-Capillary: 193 mg/dL — ABNORMAL HIGH (ref 65–99)
Glucose-Capillary: 284 mg/dL — ABNORMAL HIGH (ref 65–99)

## 2015-03-04 MED ORDER — PREDNISONE 10 MG PO TABS
10.0000 mg | ORAL_TABLET | Freq: Every day | ORAL | Status: DC
  Administered 2015-03-05: 10 mg via ORAL
  Filled 2015-03-04: qty 1

## 2015-03-04 MED ORDER — METHYLPREDNISOLONE SODIUM SUCC 125 MG IJ SOLR
80.0000 mg | Freq: Once | INTRAMUSCULAR | Status: AC
  Administered 2015-03-04: 80 mg via INTRAVENOUS
  Filled 2015-03-04: qty 2

## 2015-03-04 MED ORDER — MORPHINE SULFATE 2 MG/ML IJ SOLN
1.0000 mg | INTRAMUSCULAR | Status: DC | PRN
  Administered 2015-03-04 – 2015-03-05 (×5): 1 mg via INTRAVENOUS
  Filled 2015-03-04 (×5): qty 1

## 2015-03-04 NOTE — Progress Notes (Signed)
Daily Rounding Note  03/04/2015, 9:24 AM  LOS: 5 days   SUBJECTIVE:       Continues to use PRN Morphine frequently, 4 doses of 2 mg each yesterday. Last BM ~ 2 to 3 days ago.  Finally able to take small amount clears this AM.  starte having vaginal bleeding 2 days ago.  Had not seen this for 7 years.    OBJECTIVE:         Vital signs in last 24 hours:    Temp:  [98.6 F (37 C)-99.3 F (37.4 C)] 98.6 F (37 C) (08/03 0459) Pulse Rate:  [91-102] 102 (08/03 0459) Resp:  [18] 18 (08/03 0459) BP: (138-165)/(74-96) 151/96 mmHg (08/03 0459) SpO2:  [92 %-97 %] 97 % (08/03 0459) Last BM Date: 02/27/15 Filed Weights   02/27/15 1649  Weight: 405 lb (183.707 kg)   General: obese, depressed, not ill looking   Heart: RRR Chest: clear bil.  Very distant BS due to body habitus.  Abdomen: soft, NT, obese.  BS distant.   Extremities: no CCE Neuro/Psych:  Alert, depressed. No tremor.   Intake/Output from previous day: 08/02 0701 - 08/03 0700 In: 300 [P.O.:300] Out: -   Intake/Output this shift:    Lab Results:  Recent Labs  03/03/15 0544  WBC 12.4*  HGB 10.7*  HCT 32.7*  PLT 310   BMET  Recent Labs  03/03/15 0544  NA 135  K 3.6  CL 103  CO2 23  GLUCOSE 150*  BUN 9  CREATININE 0.95  CALCIUM 8.4*   LFT No results for input(s): PROT, ALBUMIN, AST, ALT, ALKPHOS, BILITOT, BILIDIR, IBILI in the last 72 hours. PT/INR No results for input(s): LABPROT, INR in the last 72 hours. Hepatitis Panel No results for input(s): HEPBSAG, HCVAB, HEPAIGM, HEPBIGM in the last 72 hours.  Studies/Results: Dg Abd Portable 1v  03/02/2015   CLINICAL DATA:  Nausea  EXAM: PORTABLE ABDOMEN - 1 VIEW  COMPARISON:  February 28, 2015  FINDINGS: There is no bowel dilatation or air-fluid level suggesting obstruction. No free air is seen on this supine examination. There is moderate stool in the colon. No abnormal calcifications. Lung bases are  clear.  IMPRESSION: Bowel gas pattern unremarkable.   Electronically Signed   By: Lowella Grip III M.D.   On: 03/02/2015 15:25   Scheduled Meds: . alum & mag hydroxide-simeth  30 mL Oral TID  . amLODipine  10 mg Oral Daily  . azaTHIOprine  100 mg Oral BID  . carvedilol  3.125 mg Oral BID WC  . citalopram  10 mg Oral QPM  . clopidogrel  75 mg Oral Daily  . cycloSPORINE  1 drop Both Eyes TID  . enoxaparin (LOVENOX) injection  90 mg Subcutaneous QHS  . erythromycin  500 mg Intravenous 4 times per day  . hydrALAZINE  50 mg Oral 3 times per day  . hydroxychloroquine  200 mg Oral BID  . insulin aspart  0-9 Units Subcutaneous Q4H  . nitroGLYCERIN  1 inch Topical 4 times per day  . ondansetron (ZOFRAN) IV  8 mg Intravenous TID  . pantoprazole  40 mg Oral BID AC   Continuous Infusions:  PRN Meds:.hydrALAZINE, LORazepam, morphine injection, promethazine  ASSESMENT:   * Nausea, vomiting, abdominal pain. Somewhat improved. Reflux esophagitis noted on July/2014 EGD. Outpatient acid suppression coverage with famotidine, now on BID Protonix.  Erythromycin IV added 03/02/15. Presumed diabetic gastroparesis but no GES studies  in [facility].    EGD 03/03/15: Normal, no inflammation and no retained gastric contents.   * Normocytic anemia, chronic.  Current Hgb is within historic range of 9.5 to 10.5.Marland Kitchen   *  Vaginal bleeding.   * Sjogren's syndrome, lupus. On chronic low-dose prednisone, azathioprine, plaquenil..  * Status post ablation of benign liver lesion.   *  Chronic Plavix for hx CVA.    PLAN   *  Going to drop the Morphine to 1 mg instead of 2. Leave on clears for now.  ? Add antispasmodic (bentyl vs hyoscyamine): will d/w MD.      [name]  03/04/2015, 9:24 AM Pager: [phone]      ________________________________________________________________________  Velora Heckler GI MD note:  I personally examined the patient, reviewed the data and agree with the assessment  and plan described above.  No clear etiology of her abd pain, vomiting.  I told her to try to limit narcotic pain meds as best as possible.  Advance diet as tolerated.   [name] Loffler, MD [facility] Pager [phone]

## 2015-03-04 NOTE — Progress Notes (Signed)
Pt continues to state that she is having nausea and pain from gagging, but never vomits. Pt refused pm by mouth medications. Monitoring will continue.

## 2015-03-04 NOTE — Progress Notes (Signed)
PATIENT DETAILS Name: [name] Age: 44 y.o. Sex: female Date of Birth: 27-Jun-1971 Admit Date: 02/27/2015 Admitting Physician Reubin Milan, MD ZOX:[id number],[id number] J, MD  Subjective: Feels somewhat better-less nausea-but continues to have some intermittent dry heaves  Assessment/Plan: Principal Problem: Intractable nausea and vomiting with abdominal pain: Secondary to suspected gastroparesis. Seen by GI, placed on IV PPI, underwent EGD on 03/02/2015 which was unremarkable, clinically everything pointed towards gastroparesis, she could not be placed on Reglan due to allergy and was placed on IV erythromycin on 03/02/2015, with some improvement. Continue to minimize narcotics as much as possible, slowly advance diet. Abdomen is soft.  Active Problems: History of  SJOGREN'S SYNDROME/lupus: Doubt about related symptoms related to underlying autoimmune disease. Continue with Imuran, Plaquenil. Resume prednisone at double dose-Will give 1 dose of IV Solu-Medrol today.  History of hypertension: Moderate controlled-continue with Coreg, amlodipine, hydralazine-follow and optimize as needed. Will continue to hold losartan and Demadex for now  History of GERD: Continue with PPI  History of chronic diastolic heart failure: Clinically compensated. Resume diuretics when vomiting/nausea much better  History of type 2 diabetes: CBGs stable with SSI-metformin remains on hold  Chronic pain syndrome: May need to resume oral narcotics-slowly minimize IV and transitioned to oral morphine when able.  Vaginal bleeding: Occurred 2 days back-Will provide supportive care in probably will require outpatient GYN follow-up.  History of CVA: Continue Plavix  History of morbid obesity: Also regarding importance of weight loss  Disposition: Remain inpatient-home in the next 2 days once her vomiting is much better  Antimicrobial agents  See below  Anti-infectives    Start     Dose/Rate  Route Frequency Ordered Stop   03/02/15 1600  erythromycin 500 mg in sodium chloride 0.9 % 100 mL IVPB     500 mg 100 mL/hr over 60 Minutes Intravenous 4 times per day 03/02/15 1447     02/28/15 1300  hydroxychloroquine (PLAQUENIL) tablet 200 mg     200 mg Oral 2 times daily 02/28/15 1133        DVT Prophylaxis: Prophylactic Lovenox   Code Status: Full code   Family Communication None at bedside  Procedures: EGD 8/1  CONSULTS:  GI  Time spent 30 minutes-Greater than 50% of this time was spent in counseling, explanation of diagnosis, planning of further management, and coordination of care.  MEDICATIONS: Scheduled Meds: . alum & mag hydroxide-simeth  30 mL Oral TID  . amLODipine  10 mg Oral Daily  . azaTHIOprine  100 mg Oral BID  . carvedilol  3.125 mg Oral BID WC  . citalopram  10 mg Oral QPM  . clopidogrel  75 mg Oral Daily  . cycloSPORINE  1 drop Both Eyes TID  . enoxaparin (LOVENOX) injection  90 mg Subcutaneous QHS  . erythromycin  500 mg Intravenous 4 times per day  . hydrALAZINE  50 mg Oral 3 times per day  . hydroxychloroquine  200 mg Oral BID  . insulin aspart  0-9 Units Subcutaneous Q4H  . nitroGLYCERIN  1 inch Topical 4 times per day  . ondansetron (ZOFRAN) IV  8 mg Intravenous TID  . pantoprazole  40 mg Oral BID AC   Continuous Infusions:  PRN Meds:.hydrALAZINE, LORazepam, morphine injection, promethazine    PHYSICAL EXAM: Vital signs in last 24 hours: Filed Vitals:   03/03/15 1318 03/03/15 2153 03/04/15 0039 03/04/15 0459  BP: 138/74 165/90 158/85  151/96  Pulse: 102 97  102  Temp: 98.8 F (37.1 C) 99.3 F (37.4 C)  98.6 F (37 C)  TempSrc: Oral Oral  Oral  Resp: 18 18  18   Height:      Weight:      SpO2: 92% 94%  97%    Weight change:  Filed Weights   02/27/15 1649  Weight: 183.707 kg (405 lb)   Body mass index is 61.59 kg/(m^2).   Gen Exam: Awake and alert with clear speech.  Neck: Supple, No JVD.  Chest: B/L Clear.   CVS: S1  S2 Regular, no murmurs.  Abdomen: soft, BS +, non tender, non distended.  Extremities: no edema, lower extremities warm to touch. Neurologic: Non Focal.   Skin: No Rash.   Wounds: N/A.    Intake/Output from previous day:  Intake/Output Summary (Last 24 hours) at 03/04/15 1444 Last data filed at 03/04/15 1324  Gross per 24 hour  Intake    220 ml  Output     10 ml  Net    210 ml     LAB RESULTS: CBC  Recent Labs Lab 02/27/15 1746 02/28/15 0423 03/03/15 0544  WBC 9.6 12.8* 12.4*  HGB 10.5* 10.9* 10.7*  HCT 33.4* 34.5* 32.7*  PLT 293 314 310  MCV 84.8 82.7 83.2  MCH 26.6 26.1 27.2  MCHC 31.4 31.6 32.7  RDW 15.3 15.1 15.4  LYMPHSABS 2.1 1.5  --   MONOABS 0.7 0.8  --   EOSABS 0.1 0.0  --   BASOSABS 0.0 0.0  --     Chemistries   Recent Labs Lab 02/27/15 1746 02/28/15 0423 03/03/15 0544  NA 137 135 135  K 3.7 3.8 3.6  CL 105 102 103  CO2 25 23 23   GLUCOSE 171* 174* 150*  BUN 6 6 9   CREATININE 0.93 0.81 0.95  CALCIUM 8.4* 8.4* 8.4*    CBG:  Recent Labs Lab 03/03/15 2156 03/04/15 0029 03/04/15 0418 03/04/15 0815 03/04/15 1245  GLUCAP 132* 128* 154* 113* 172*    GFR Estimated Creatinine Clearance: 133.4 mL/min (by C-G formula based on Cr of 0.95).  Coagulation profile No results for input(s): INR, PROTIME in the last 168 hours.  Cardiac Enzymes No results for input(s): CKMB, TROPONINI, MYOGLOBIN in the last 168 hours.  Invalid input(s): CK  Invalid input(s): POCBNP No results for input(s): DDIMER in the last 72 hours. No results for input(s): HGBA1C in the last 72 hours. No results for input(s): CHOL, HDL, LDLCALC, TRIG, CHOLHDL, LDLDIRECT in the last 72 hours. No results for input(s): TSH, T4TOTAL, T3FREE, THYROIDAB in the last 72 hours.  Invalid input(s): FREET3 No results for input(s): VITAMINB12, FOLATE, FERRITIN, TIBC, IRON, RETICCTPCT in the last 72 hours. No results for input(s): LIPASE, AMYLASE in the last 72 hours.  Urine  Studies No results for input(s): UHGB, CRYS in the last 72 hours.  Invalid input(s): UACOL, UAPR, USPG, UPH, UTP, UGL, UKET, UBIL, UNIT, UROB, ULEU, UEPI, UWBC, URBC, UBAC, CAST, UCOM, BILUA  MICROBIOLOGY: No results found for this or any previous visit (from the past 240 hour(s)).  RADIOLOGY STUDIES/RESULTS: Dg Abd Portable 1v  03/02/2015   CLINICAL DATA:  Nausea  EXAM: PORTABLE ABDOMEN - 1 VIEW  COMPARISON:  February 28, 2015  FINDINGS: There is no bowel dilatation or air-fluid level suggesting obstruction. No free air is seen on this supine examination. There is moderate stool in the colon. No abnormal calcifications. Lung bases are clear.  IMPRESSION: Bowel  gas pattern unremarkable.   Electronically Signed   By: Lowella Grip III M.D.   On: 03/02/2015 15:25   Dg Abd Portable 1v  02/28/2015   CLINICAL DATA:  Abdominal pain, nausea and vomiting for 1 day.  EXAM: PORTABLE ABDOMEN - 1 VIEW  COMPARISON:  11/11/2013 radiographs  FINDINGS: The bowel gas pattern is normal. No radio-opaque calculi or other significant radiographic abnormality are seen.  IMPRESSION: Negative.   Electronically Signed   By: Margarette Canada M.D.   On: 02/28/2015 07:24   US Abdomen Limited Ruq  02/27/2015   CLINICAL DATA:  Hypertension.  Nausea and vomiting  EXAM: US ABDOMEN LIMITED - RIGHT UPPER QUADRANT  COMPARISON:  CT 04/27/2014  FINDINGS: Gallbladder:  No gallstones or wall thickening visualized. Patient patient heavily medicated. Sonographic Murphy's sign could not be assessed.  Common bile duct:  Diameter: 4 mm  Liver:  No focal lesion identified. Within normal limits in parenchymal echogenicity.  IMPRESSION: No evidence of cholecystitis.   Electronically Signed   By: Suzy Bouchard M.D.   On: 02/27/2015 20:48    Oren Binet, MD  [facility] Pager:[phone]  If 7PM-7AM, please contact night-coverage [email or web address] Password [email or web address] 03/04/2015, 2:44 PM   LOS: 5 days

## 2015-03-04 NOTE — Progress Notes (Signed)
Utilization Review completed. Fayelynn Distel RN BSN CM 

## 2015-03-04 NOTE — Care Management Note (Signed)
Case Management Note  Patient Details  Name: [name] MRN: [id number] Date of Birth: 1970/12/12  Subjective/Objective:    NCM spoke with patient, she lives alone, she uses a cane, patient states she will probably need transport at dc, she came to hospital via ambulance, NCM will inform CSW.  Patient states she has medicaid and get her meds [facility].  NCM will cont to follow for dc needs.                Action/Plan:   Expected Discharge Date:                  Expected Discharge Plan:  Home/Self Care  In-House Referral:     Discharge planning Services  CM Consult  Post Acute Care Choice:    Choice offered to:     DME Arranged:    DME Agency:     HH Arranged:    HH Agency:     Status of Service:  In process, will continue to follow  Medicare Important Message Given:    Date Medicare IM Given:    Medicare IM give by:    Date Additional Medicare IM Given:    Additional Medicare Important Message give by:     If discussed at [place] of Stay Meetings, dates discussed:    Additional Comments:  Zenon Mayo, RN 03/04/2015, 11:26 AM

## 2015-03-05 DIAGNOSIS — G43009 Migraine without aura, not intractable, without status migrainosus: Secondary | ICD-10-CM

## 2015-03-05 DIAGNOSIS — Z8739 Personal history of other diseases of the musculoskeletal system and connective tissue: Secondary | ICD-10-CM

## 2015-03-05 DIAGNOSIS — G44221 Chronic tension-type headache, intractable: Secondary | ICD-10-CM

## 2015-03-05 LAB — CBC
HEMATOCRIT: 32.5 % — AB (ref 36.0–46.0)
HEMOGLOBIN: 10.5 g/dL — AB (ref 12.0–15.0)
MCH: 26.2 pg (ref 26.0–34.0)
MCHC: 32.3 g/dL (ref 30.0–36.0)
MCV: 81 fL (ref 78.0–100.0)
Platelets: 335 10*3/uL (ref 150–400)
RBC: 4.01 MIL/uL (ref 3.87–5.11)
RDW: 15.4 % (ref 11.5–15.5)
WBC: 9.6 10*3/uL (ref 4.0–10.5)

## 2015-03-05 LAB — BASIC METABOLIC PANEL
Anion gap: 11 (ref 5–15)
BUN: 8 mg/dL (ref 6–20)
CALCIUM: 8.5 mg/dL — AB (ref 8.9–10.3)
CO2: 21 mmol/L — AB (ref 22–32)
Chloride: 103 mmol/L (ref 101–111)
Creatinine, Ser: 0.91 mg/dL (ref 0.44–1.00)
GFR calc Af Amer: >60 mL/min (ref >60)
Glucose, Bld: 224 mg/dL — ABNORMAL HIGH (ref 65–99)
Potassium: 3.8 mmol/L (ref 3.5–5.1)
SODIUM: 135 mmol/L (ref 135–145)

## 2015-03-05 LAB — GLUCOSE, CAPILLARY
GLUCOSE-CAPILLARY: 215 mg/dL — AB (ref 65–99)
Glucose-Capillary: 234 mg/dL — ABNORMAL HIGH (ref 65–99)

## 2015-03-05 MED ORDER — CARVEDILOL 6.25 MG PO TABS: 6.2500 mg | ORAL_TABLET | Freq: Two times a day (BID) | ORAL | Status: DC

## 2015-03-05 MED ORDER — PANTOPRAZOLE SODIUM 40 MG PO TBEC
40.0000 mg | DELAYED_RELEASE_TABLET | Freq: Every day | ORAL | Status: DC
Start: 2015-03-05 — End: 2015-11-23

## 2015-03-05 NOTE — Care Management Note (Signed)
Case Management Note  Patient Details  Name: [name] MRN: [id number] Date of Birth: October 25, 1970  Subjective/Objective:     Patient is for dc today, she states she will need transport home. CSW informed, she will speak to patient regarding transport.               Action/Plan:   Expected Discharge Date:                  Expected Discharge Plan:  Home/Self Care  In-House Referral:  Clinical Social Work  Discharge planning Services  CM Consult  Post Acute Care Choice:    Choice offered to:     DME Arranged:    DME Agency:     HH Arranged:    [place] Agency:     Status of Service:  Completed, signed off  Medicare Important Message Given:    Date Medicare IM Given:    Medicare IM give by:    Date Additional Medicare IM Given:    Additional Medicare Important Message give by:     If discussed at [place] of Stay Meetings, dates discussed:    Additional Comments:  Zenon Mayo, RN 03/05/2015, 10:13 AM

## 2015-03-05 NOTE — Progress Notes (Signed)
[facility] Progress Note    Since last GI note: Feeling better this AM. Slept well.  Tolerated liquids without trouble yesterday.  Interested in advancing her diet today, hopes to go home.  Objective: Vital signs in last 24 hours: Temp:  [99.1 F (37.3 C)-99.4 F (37.4 C)] 99.4 F (37.4 C) (08/04 0557) Pulse Rate:  [99-111] 103 (08/04 0557) Resp:  [18-20] 18 (08/04 0557) BP: (159-177)/(101-103) 175/103 mmHg (08/04 0557) SpO2:  [97 %-100 %] 98 % (08/04 0557) Last BM Date: 02/27/15 General: alert and oriented times 3 Heart: regular rate and rythm Abdomen: soft, non-tender, non-distended, normal bowel sounds BMI 60s  Lab Results:  Recent Labs  03/03/15 0544 03/05/15 0540  WBC 12.4* 9.6  HGB 10.7* 10.5*  PLT 310 335  MCV 83.2 81.0    Recent Labs  03/03/15 0544 03/05/15 0540  NA 135 135  K 3.6 3.8  CL 103 103  CO2 23 21*  GLUCOSE 150* 224*  BUN 9 8  CREATININE 0.95 0.91  CALCIUM 8.4* 8.5*   Medications: Scheduled Meds: . alum & mag hydroxide-simeth  30 mL Oral TID  . amLODipine  10 mg Oral Daily  . azaTHIOprine  100 mg Oral BID  . carvedilol  3.125 mg Oral BID WC  . citalopram  10 mg Oral QPM  . clopidogrel  75 mg Oral Daily  . cycloSPORINE  1 drop Both Eyes TID  . enoxaparin (LOVENOX) injection  90 mg Subcutaneous QHS  . erythromycin  500 mg Intravenous 4 times per day  . hydrALAZINE  50 mg Oral 3 times per day  . hydroxychloroquine  200 mg Oral BID  . insulin aspart  0-9 Units Subcutaneous Q4H  . nitroGLYCERIN  1 inch Topical 4 times per day  . ondansetron (ZOFRAN) IV  8 mg Intravenous TID  . pantoprazole  40 mg Oral BID AC  . predniSONE  10 mg Oral Q breakfast   Continuous Infusions:  PRN Meds:.hydrALAZINE, LORazepam, morphine injection, promethazine    Assessment/Plan: 44 y.o. female with improving n/vomiting  Multifactorial Nausea/Vomiting; GERD related?, BMI 60s, chronically ill on immune suppressing meds, superimposed viral  infection?, CM gastroparesis?.  She is improving.  Will advance diet and she is OK to go home. Would advise that she stay on PPI once daily, indefinitely.  She has GI at [facility], should follow up at normally scheduled apt.    Milus Banister, MD  03/05/2015, 7:31 AM [facility] Pager [phone]

## 2015-03-05 NOTE — Discharge Summary (Signed)
PATIENT DETAILS Name: [name] Age: 44 y.o. Sex: female Date of Birth: 11-28-1970 MRN: [id number]. Admitting Physician: Reubin Milan, MD NWG:[id number],HYQMV J, MD  Admit Date: 02/27/2015 Discharge date: 03/05/2015  Recommendations for Outpatient Follow-up:  1. Please repeat CBC/BMET in 1 week    PRIMARY DISCHARGE DIAGNOSIS:  Principal Problem:   Intractable nausea and vomiting Active Problems:   SJOGREN'S SYNDROME   Anemia   HTN (hypertension)   Morbid obesity   Lupus   Intractable pain   Nausea with vomiting   Epigastric pain   History of lupus   History of Sjogren's disease      PAST MEDICAL HISTORY: Past Medical History  Diagnosis Date  . Bell's palsy 08/09/2010  . SJOGREN'S SYNDROME 08/09/2010  . Hypertension   . High cholesterol   . Anginal pain   . Obstructive sleep apnea on CPAP 2011  . Anxiety   . GERD (gastroesophageal reflux disease)   . CHF (congestive heart failure)   . Acute renal failure      Intractable nausea vomiting secondary to diabetic gastroparesis causing dehydration and acute renal failure /notes 04/01/2013  . Liver mass 2014    biopsied 03/2013 at [facility], not malignant.  is to undergo radiologic ablation of the mass in sept/October 2014.   . On home oxygen therapy     "2L 24/7" (02/28/2015)  . Migraines     "maybe a couple times/yr" (04/01/2013)  . Stroke 2010; 10/2012    "left side is still weak from it, never fully regained full strength; no additions from stroke 10/2012"  . Obesity   . Family history of malignant neoplasm of breast   . Family history of malignant neoplasm of ovary   . Chest pain 01/13/2014  . Diabetic gastroparesis associated with type 2 diabetes mellitus     this is presumed diagnoses, not confirmed by any studies.   Marland Kitchen SLE (systemic lupus erythematosus)     Archie Endo 12/01/2010  . CAP (community acquired pneumonia) 10/2011    Archie Endo 11/08/2011    DISCHARGE MEDICATIONS: Current Discharge Medication List    START  taking these medications   Details  pantoprazole (PROTONIX) 40 MG tablet Take 1 tablet (40 mg total) by mouth daily. Qty: 60 tablet, Refills: 0      CONTINUE these medications which have NOT CHANGED   Details  Albiglutide (TANZEUM) 30 MG PEN Inject 30 mg into the skin once a week. Sundays    albuterol (PROAIR HFA) 108 (90 BASE) MCG/ACT inhaler Inhale 2 puffs into the lungs every 6 (six) hours as needed for wheezing or shortness of breath.     albuterol (PROVENTIL) (5 MG/ML) 0.5% nebulizer solution Take 2.5 mg by nebulization every 6 (six) hours as needed for wheezing or shortness of breath.  Refills: 5    alendronate (FOSAMAX) 70 MG tablet Take 70 mg by mouth every Saturday. Take with a full glass of water on an empty stomach.    aspirin EC 81 MG tablet Take 81 mg by mouth daily.    azaTHIOprine (IMURAN) 50 MG tablet Take 100 mg by mouth 2 (two) times daily.     carvedilol (COREG) 3.125 MG tablet Take 3.125 mg by mouth 2 (two) times daily with a meal.    cetirizine (ZYRTEC) 10 MG tablet Take 10 mg by mouth daily.    citalopram (CELEXA) 10 MG tablet Take 1 tablet (10 mg total) by mouth every evening. Qty: 30 tablet, Refills: 0    clopidogrel (PLAVIX)  75 MG tablet Take 75 mg by mouth daily.    cycloSPORINE (RESTASIS) 0.05 % ophthalmic emulsion Place 1 drop into both eyes 3 (three) times daily.    famotidine (PEPCID) 20 MG tablet Take 20 mg by mouth 2 (two) times daily.    fluorometholone (FML) 0.1 % ophthalmic suspension Place 1 drop into both eyes 2 (two) times daily. Refills: 0    hydroxychloroquine (PLAQUENIL) 200 MG tablet Take 200 mg by mouth 2 (two) times daily.     insulin regular human CONCENTRATED (HUMULIN R) 500 UNIT/ML SOLN injection INJECT 0.12 MLS (60 UNITS TOTAL) INTO THE SKIN 3 (THREE) TIMES DAILY WITH MEALS. Qty: 20 mL, Refills: 2    isosorbide-hydrALAZINE (BIDIL) 20-37.5 MG per tablet Take 1 tablet by mouth 3 (three) times daily.    LORazepam (ATIVAN) 1 MG  tablet Take 1 tablet (1 mg total) by mouth every 6 (six) hours as needed for anxiety. Qty: 10 tablet, Refills: 0    losartan (COZAAR) 100 MG tablet Take 100 mg by mouth daily. Refills: 3    megestrol (MEGACE) 40 MG tablet Take 40 mg by mouth daily. Refills: 3    metFORMIN (GLUCOPHAGE-XR) 500 MG 24 hr tablet Take 1,000 mg by mouth 2 (two) times daily.    morphine (MSIR) 15 MG tablet Take 30 mg by mouth every evening.     nitroGLYCERIN (NITROSTAT) 0.4 MG SL tablet Place 0.4 mg under the tongue every 5 (five) minutes x 3 doses as needed for chest pain.     NUVARING 0.12-0.015 MG/24HR vaginal ring Place 1 each vaginally every 21 ( twenty-one) days.  Refills: 12    PAZEO 0.7 % SOLN Place 1 drop into both eyes every morning. Refills: 3    Polyvinyl Alcohol-Povidone (REFRESH OP) Place 1 drop into both eyes 2 (two) times daily.    potassium chloride (MICRO-K) 10 MEQ CR capsule Take 10 mEq by mouth 2 (two) times daily.  Refills: 3    predniSONE (DELTASONE) 5 MG tablet Take 5 mg by mouth daily.    pregabalin (LYRICA) 75 MG capsule Take 75 mg by mouth 2 (two) times daily.    torsemide (DEMADEX) 20 MG tablet Take 1 tablet (20 mg total) by mouth 2 (two) times daily. Qty: 30 tablet, Refills: 0        ALLERGIES:   Allergies  Allergen Reactions  . Metoclopramide Other (See Comments)    Developed restless leg, akathisia type limb movements.   . Oxycodone-Acetaminophen Hives and Rash    FYI: no reaction to hydrocodone; tolerates dilaudid  . Codeine Itching, Rash and Other (See Comments)    (Derm)    BRIEF HPI:  See H&P, Labs, Consult and Test reports for all details in brief, patient is a 44 year old female with morbid obesity, lupus who presented with abdominal pain along with nausea and vomiting. She was subsequently admitted for further evaluation and treatment  CONSULTATIONS:   GI  PERTINENT RADIOLOGIC STUDIES: Dg Abd Portable 1v  03/02/2015   CLINICAL DATA:  Nausea  EXAM:  PORTABLE ABDOMEN - 1 VIEW  COMPARISON:  February 28, 2015  FINDINGS: There is no bowel dilatation or air-fluid level suggesting obstruction. No free air is seen on this supine examination. There is moderate stool in the colon. No abnormal calcifications. Lung bases are clear.  IMPRESSION: Bowel gas pattern unremarkable.   Electronically Signed   By: Lowella Grip III M.D.   On: 03/02/2015 15:25   Dg Abd Portable 1v  02/28/2015  CLINICAL DATA:  Abdominal pain, nausea and vomiting for 1 day.  EXAM: PORTABLE ABDOMEN - 1 VIEW  COMPARISON:  11/11/2013 radiographs  FINDINGS: The bowel gas pattern is normal. No radio-opaque calculi or other significant radiographic abnormality are seen.  IMPRESSION: Negative.   Electronically Signed   By: Margarette Canada M.D.   On: 02/28/2015 07:24   US Abdomen Limited Ruq  02/27/2015   CLINICAL DATA:  Hypertension.  Nausea and vomiting  EXAM: US ABDOMEN LIMITED - RIGHT UPPER QUADRANT  COMPARISON:  CT 04/27/2014  FINDINGS: Gallbladder:  No gallstones or wall thickening visualized. Patient patient heavily medicated. Sonographic Murphy's sign could not be assessed.  Common bile duct:  Diameter: 4 mm  Liver:  No focal lesion identified. Within normal limits in parenchymal echogenicity.  IMPRESSION: No evidence of cholecystitis.   Electronically Signed   By: Suzy Bouchard M.D.   On: 02/27/2015 20:48     PERTINENT LAB RESULTS: CBC:  Recent Labs  03/03/15 0544 03/05/15 0540  WBC 12.4* 9.6  HGB 10.7* 10.5*  HCT 32.7* 32.5*  PLT 310 335   CMET CMP     Component Value Date/Time   NA 135 03/05/2015 0540   K 3.8 03/05/2015 0540   CL 103 03/05/2015 0540   CO2 21* 03/05/2015 0540   GLUCOSE 224* 03/05/2015 0540   BUN 8 03/05/2015 0540   CREATININE 0.91 03/05/2015 0540   CALCIUM 8.5* 03/05/2015 0540   PROT 7.1 02/28/2015 0423   ALBUMIN 3.0* 02/28/2015 0423   AST 104* 02/28/2015 0423   ALT 59* 02/28/2015 0423   ALKPHOS 76 02/28/2015 0423   BILITOT 0.5 02/28/2015 0423    GFRNONAA >60 03/05/2015 0540   GFRAA >60 03/05/2015 0540    GFR Estimated Creatinine Clearance: 139.2 mL/min (by C-G formula based on Cr of 0.91). No results for input(s): LIPASE, AMYLASE in the last 72 hours. No results for input(s): CKTOTAL, CKMB, CKMBINDEX, TROPONINI in the last 72 hours. Invalid input(s): POCBNP No results for input(s): DDIMER in the last 72 hours. No results for input(s): HGBA1C in the last 72 hours. No results for input(s): CHOL, HDL, LDLCALC, TRIG, CHOLHDL, LDLDIRECT in the last 72 hours. No results for input(s): TSH, T4TOTAL, T3FREE, THYROIDAB in the last 72 hours.  Invalid input(s): FREET3 No results for input(s): VITAMINB12, FOLATE, FERRITIN, TIBC, IRON, RETICCTPCT in the last 72 hours. Coags: No results for input(s): INR in the last 72 hours.  Invalid input(s): PT Microbiology: No results found for this or any previous visit (from the past 240 hour(s)).   BRIEF HOSPITAL COURSE:  Intractable nausea and vomiting with abdominal pain: Secondary to suspected gastroparesis. Seen by GI, placed on IV PPI, underwent EGD on 03/02/2015 which was unremarkable, clinically everything pointed towards gastroparesis, she could not be placed on Reglan due to allergy and was placed on IV erythromycin on 03/02/2015, with improvement. By day of discharge, able to tolerate a regular diet and is now requesting discharge. Seen by gastroenterology on day of discharge, okay to discharge. Recommendations are to continue with PPI indefinitely. t.  Active Problems: History of SJOGREN'S SYNDROME/lupus: Doubt about related symptoms related to underlying autoimmune disease. Continue with Imuran, Plaquenil and prednisone.   History of hypertension: Moderate controlled-continue with Coreg, amlodipine, hydralazine. Resume losartan and Demadex on discharge  History of GERD: Continue with PPI  History of chronic diastolic heart failure: Clinically compensated. Resume diuretics on  discharge as markedly better with no furthervomiting/nausea m  History of type 2 diabetes: CBGs  stable with SSI-resume metformin and her usual regimen on discharge  Chronic pain syndrome:  resume oral narcotics on discharge  Vaginal bleeding: Occurred 3 days back-now very light-I have asked patient to follow up with her GYN for further care. She was encouraged to call her GYN and make a appt in the next few day  History of CVA: Continue Plavix  History of morbid obesity: Also regarding importance of weight loss   TODAY-DAY OF DISCHARGE:  Subjective:   Lujuana Kapler today has no headache,no chest abdominal pain,no new weakness tingling or numbness, feels much better wants to go home today. She claims she feels much better today with no further nausea or vomiting and has tolerated a regular diet for breakfast  Objective:   Blood pressure 175/103, pulse 103, temperature 99.4 F (37.4 C), temperature source Oral, resp. rate 18, height 5\' 8"  (1.727 m), weight 183.707 kg (405 lb), SpO2 98 %.  Intake/Output Summary (Last 24 hours) at 03/05/15 0948 Last data filed at 03/04/15 1900  Gross per 24 hour  Intake   1224 ml  Output     10 ml  Net   1214 ml   Filed Weights   02/27/15 1649  Weight: 183.707 kg (405 lb)    Exam Awake Alert, Oriented *3, No new F.N deficits, Normal affect [place].AT,PERRAL Supple Neck,No JVD, No cervical lymphadenopathy appriciated.  Symmetrical Chest wall movement, Good air movement bilaterally, CTAB RRR,No Gallops,Rubs or new Murmurs, No Parasternal Heave +ve B.Sounds, Abd Soft, Non tender, No organomegaly appriciated, No rebound -guarding or rigidity. No Cyanosis, Clubbing or edema, No new Rash or bruise  DISCHARGE CONDITION: Stable  DISPOSITION: Home with home health services  DISCHARGE INSTRUCTIONS:    Activity:  As tolerated   Diet recommendation: Diabetic Diet Heart Healthy diet  Discharge Instructions    Call MD for:  persistant nausea and  vomiting    Complete by:  As directed      Call MD for:  severe uncontrolled pain    Complete by:  As directed      Diet - low sodium heart healthy    Complete by:  As directed      Diet general    Complete by:  As directed      Increase activity slowly    Complete by:  As directed            Follow-up Information    Follow up with Janey Genta, MD On 04/20/2015.   Specialty:  Gastroenterology   Why:  gastroenterology appointment at [facility] [place] [phone]       Please follow up.   Why:  please keep your appt   Contact information:   Primary GI MD at [facility]      Call in 2 days to follow up.   Why:  for evaluation of vaginal bleeding   Contact information:   Primary GYN MD      Follow up with Elyn Peers, MD. Schedule an appointment as soon as possible for a visit in 1 week.   Specialty:  Family Medicine   Contact information:   [address] [place] [address] [phone]       Total Time spent on discharge equals 45 minutes.  SignedOren Binet 03/05/2015 9:48 AM

## 2015-03-05 NOTE — Progress Notes (Signed)
Nsg Discharge Note  Admit Date:  02/27/2015 Discharge date: 03/05/2015   [name] to be D/C'd Home per MD order.  AVS completed.  Copy for chart, and copy for patient signed, and dated. Patient/caregiver able to verbalize understanding.  Discharge Medication:   Medication List    TAKE these medications        alendronate 70 MG tablet  Commonly known as:  FOSAMAX  Take 70 mg by mouth every Saturday. Take with a full glass of water on an empty stomach.     aspirin EC 81 MG tablet  Take 81 mg by mouth daily.     azaTHIOprine 50 MG tablet  Commonly known as:  IMURAN  Take 100 mg by mouth 2 (two) times daily.     cetirizine 10 MG tablet  Commonly known as:  ZYRTEC  Take 10 mg by mouth daily.     citalopram 10 MG tablet  Commonly known as:  CELEXA  Take 1 tablet (10 mg total) by mouth every evening.     clopidogrel 75 MG tablet  Commonly known as:  PLAVIX  Take 75 mg by mouth daily.     COREG 3.125 MG tablet  Generic drug:  carvedilol  Take 3.125 mg by mouth 2 (two) times daily with a meal.     cycloSPORINE 0.05 % ophthalmic emulsion  Commonly known as:  RESTASIS  Place 1 drop into both eyes 3 (three) times daily.     famotidine 20 MG tablet  Commonly known as:  PEPCID  Take 20 mg by mouth 2 (two) times daily.     fluorometholone 0.1 % ophthalmic suspension  Commonly known as:  FML  Place 1 drop into both eyes 2 (two) times daily.     hydroxychloroquine 200 MG tablet  Commonly known as:  PLAQUENIL  Take 200 mg by mouth 2 (two) times daily.     insulin regular human CONCENTRATED 500 UNIT/ML injection  Commonly known as:  HUMULIN R  INJECT 0.12 MLS (60 UNITS TOTAL) INTO THE SKIN 3 (THREE) TIMES DAILY WITH MEALS.     isosorbide-hydrALAZINE 20-37.5 MG per tablet  Commonly known as:  BIDIL  Take 1 tablet by mouth 3 (three) times daily.     LORazepam 1 MG tablet  Commonly known as:  ATIVAN  Take 1 tablet (1 mg total) by mouth every 6 (six) hours as needed for  anxiety.     losartan 100 MG tablet  Commonly known as:  COZAAR  Take 100 mg by mouth daily.     LYRICA 75 MG capsule  Generic drug:  pregabalin  Take 75 mg by mouth 2 (two) times daily.     megestrol 40 MG tablet  Commonly known as:  MEGACE  Take 40 mg by mouth daily.     metFORMIN 500 MG 24 hr tablet  Commonly known as:  GLUCOPHAGE-XR  Take 1,000 mg by mouth 2 (two) times daily.     morphine 15 MG tablet  Commonly known as:  MSIR  Take 30 mg by mouth every evening.     nitroGLYCERIN 0.4 MG SL tablet  Commonly known as:  NITROSTAT  Place 0.4 mg under the tongue every 5 (five) minutes x 3 doses as needed for chest pain.     NUVARING 0.12-0.015 MG/24HR vaginal ring  Generic drug:  etonogestrel-ethinyl estradiol  Place 1 each vaginally every 21 ( twenty-one) days.     pantoprazole 40 MG tablet  Commonly known as:  PROTONIX  Take 1 tablet (40 mg total) by mouth daily.     PAZEO 0.7 % Soln  Generic drug:  Olopatadine HCl  Place 1 drop into both eyes every morning.     potassium chloride 10 MEQ CR capsule  Commonly known as:  MICRO-K  Take 10 mEq by mouth 2 (two) times daily.     predniSONE 5 MG tablet  Commonly known as:  DELTASONE  Take 5 mg by mouth daily.     albuterol (5 MG/ML) 0.5% nebulizer solution  Commonly known as:  PROVENTIL  Take 2.5 mg by nebulization every 6 (six) hours as needed for wheezing or shortness of breath.     PROAIR HFA 108 (90 BASE) MCG/ACT inhaler  Generic drug:  albuterol  Inhale 2 puffs into the lungs every 6 (six) hours as needed for wheezing or shortness of breath.     REFRESH OP  Place 1 drop into both eyes 2 (two) times daily.     TANZEUM 30 MG Pen  Generic drug:  Albiglutide  Inject 30 mg into the skin once a week. Sundays     torsemide 20 MG tablet  Commonly known as:  DEMADEX  Take 1 tablet (20 mg total) by mouth 2 (two) times daily.        Discharge Assessment: Filed Vitals:   03/05/15 0557  BP: 175/103  Pulse:  103  Temp: 99.4 F (37.4 C)  Resp: 18   Skin clean, dry and intact without evidence of skin break down, no evidence of skin tears noted. IV catheter discontinued intact. Site without signs and symptoms of complications - no redness or edema noted at insertion site, patient denies c/o pain - only slight tenderness at site.  Dressing with slight pressure applied.  D/c Instructions-Education: Discharge instructions given to patient/family with verbalized understanding. D/c education completed with patient/family including follow up instructions, medication list, d/c activities limitations if indicated, with other d/c instructions as indicated by MD - patient able to verbalize understanding, all questions fully answered. Refused am medications.  Patient instructed to return to ED, call 911, or call MD for any changes in condition.  Patient escorted via [place], and D/C home via private auto.  Dayle Points, RN 03/05/2015 11:02 AM

## 2015-03-12 ENCOUNTER — Ambulatory Visit: Payer: Medicaid Other | Admitting: Endocrinology

## 2015-03-13 ENCOUNTER — Ambulatory Visit (INDEPENDENT_AMBULATORY_CARE_PROVIDER_SITE_OTHER): Payer: Medicaid Other | Admitting: Endocrinology

## 2015-03-13 ENCOUNTER — Encounter: Payer: Self-pay | Admitting: Endocrinology

## 2015-03-13 VITALS — BP 136/88 | HR 99 | Temp 98.6°F | Ht 68.0 in | Wt 399.0 lb

## 2015-03-13 DIAGNOSIS — Z794 Long term (current) use of insulin: Secondary | ICD-10-CM | POA: Diagnosis not present

## 2015-03-13 DIAGNOSIS — E119 Type 2 diabetes mellitus without complications: Secondary | ICD-10-CM | POA: Diagnosis not present

## 2015-03-13 NOTE — Patient Instructions (Addendum)
check your blood sugar 2 times a day.  vary the time of day when you check, between before the 3 meals, and at bedtime.  also check if you have symptoms of your blood sugar being too high or too low.  please keep a record of the readings and bring it to your next appointment here.  please call us sooner if you are having low blood sugar episodes, or if it stays over 200.    Please come back for a follow-up appointment in 3 months.   Please stop taking the tanzeum.  This stoppage could increase your blood sugar.  If so, please call us, so we can increase the insulin. Please continue the same insulin for now.

## 2015-03-13 NOTE — Progress Notes (Signed)
   Subjective:    Patient ID: [name], female    DOB: 1971/04/10, 44 y.o.   MRN: [id number]  HPI Pt returns for f/u of diabetes mellitus: DM type: Insulin-requiring type 2 Dx'ed: 0277 Complications: polyneuropathy and gastroparesis.  Therapy: insulin (since 2014), tanzeum, and metformin.   GDM: never DKA: never Severe hypoglycemia: never Pancreatitis: never Other: she is too ill to undergo weight-loss surgery; she takes multiple daily injections of U-500; in early 2015, she was hospitalized with n/v, due to gastroparesis; later in 2015, Dr Criss Rosales added metformin and tanzeum.  Pt says she is not at risk for pregnancy.  she takes multiple daily injections Interval history: prednisone is still at 5 mg daily.  no cbg record, but states cbg's are well-controlled. She seldom has hypoglycemia, and these episodes are mild.  This usually happens in the middles of the night.   She was recently hospitalized again for gastroparesis.  She feels better now.  no cbg record, but states cbg's are well-controlled.  It is in general higher as the day goes on.     Review of Systems She denies hypoglycemia.      Objective:   Physical Exam VITAL SIGNS:  See vs page GENERAL: no distress Pulses: dorsalis pedis intact bilat.   MSK: no deformity of the feet CV: no leg edema Skin:  no ulcer on the feet.  normal color and temp on the feet.  Neuro: sensation is intact to touch on the feet    Lab Results  Component Value Date   HGBA1C 6.8* 02/28/2015      Assessment & Plan:  DM: well-controlled Nausea, worse, contributed to by gastroparesis and meds  Patient is advised the following: Patient Instructions  check your blood sugar 2 times a day.  vary the time of day when you check, between before the 3 meals, and at bedtime.  also check if you have symptoms of your blood sugar being too high or too low.  please keep a record of the readings and bring it to your next appointment here.  please call us  sooner if you are having low blood sugar episodes, or if it stays over 200.    Please come back for a follow-up appointment in 3 months.   Please stop taking the tanzeum.  This stoppage could increase your blood sugar.  If so, please call us, so we can increase the insulin. Please continue the same insulin for now.

## 2015-04-23 DIAGNOSIS — G44221 Chronic tension-type headache, intractable: Secondary | ICD-10-CM | POA: Insufficient documentation

## 2015-06-01 ENCOUNTER — Other Ambulatory Visit: Payer: Self-pay | Admitting: Endocrinology

## 2015-06-12 ENCOUNTER — Ambulatory Visit: Payer: Medicaid Other | Admitting: Endocrinology

## 2015-06-19 ENCOUNTER — Encounter: Payer: Self-pay | Admitting: Endocrinology

## 2015-06-19 ENCOUNTER — Ambulatory Visit (INDEPENDENT_AMBULATORY_CARE_PROVIDER_SITE_OTHER): Payer: Medicaid Other | Admitting: Endocrinology

## 2015-06-19 VITALS — BP 137/84 | HR 91 | Temp 98.1°F | Ht 68.0 in | Wt >= 6400 oz

## 2015-06-19 DIAGNOSIS — E119 Type 2 diabetes mellitus without complications: Secondary | ICD-10-CM | POA: Diagnosis not present

## 2015-06-19 DIAGNOSIS — Z794 Long term (current) use of insulin: Secondary | ICD-10-CM

## 2015-06-19 LAB — POCT GLYCOSYLATED HEMOGLOBIN (HGB A1C): Hemoglobin A1C: 7

## 2015-06-19 NOTE — Patient Instructions (Addendum)
check your blood sugar 2 times a day.  vary the time of day when you check, between before the 3 meals, and at bedtime.  also check if you have symptoms of your blood sugar being too high or too low.  please keep a record of the readings and bring it to your next appointment here.  please call us sooner if you are having low blood sugar episodes, or if it stays over 200.   Please come back for a follow-up appointment in 3 months.  Please continue the same insulin and metformin for now.

## 2015-06-19 NOTE — Progress Notes (Signed)
Subjective:    Patient ID: [name], female    DOB: 1971/05/17, 44 y.o.   MRN: [id number]  HPI Pt returns for f/u of diabetes mellitus: DM type: Insulin-requiring type 2 Dx'ed: 7829 Complications: polyneuropathy and gastroparesis.  Therapy: insulin (since 2014), and metformin.   GDM: never DKA: never Severe hypoglycemia: never Pancreatitis: never Other: she is too ill to undergo weight-loss surgery; she takes multiple daily injections of U-500; in early 2015, she was hospitalized with n/v, due to gastroparesis; Pt says she is not at risk for pregnancy.  she takes multiple daily injections Interval history: prednisone is still at 5 mg daily.  She feels better now.  no cbg record, but states cbg's are well-controlled.  It is in general higher as the day goes on. Past Medical History  Diagnosis Date  . Bell's palsy 08/09/2010  . SJOGREN'S SYNDROME 08/09/2010  . Hypertension   . High cholesterol   . Anginal pain ([place])   . Obstructive sleep apnea on CPAP 2011  . Anxiety   . GERD (gastroesophageal reflux disease)   . CHF (congestive heart failure) ([place])   . Acute renal failure (HCC)      Intractable nausea vomiting secondary to diabetic gastroparesis causing dehydration and acute renal failure /notes 04/01/2013  . Liver mass 2014    biopsied 03/2013 at [facility], not malignant.  is to undergo radiologic ablation of the mass in sept/October 2014.   . On home oxygen therapy     "2L 24/7" (02/28/2015)  . Migraines     "maybe a couple times/yr" (04/01/2013)  . Stroke [facility]) 2010; 10/2012    "left side is still weak from it, never fully regained full strength; no additions from stroke 10/2012"  . Obesity   . Family history of malignant neoplasm of breast   . Family history of malignant neoplasm of ovary   . Chest pain 01/13/2014  . Diabetic gastroparesis associated with type 2 diabetes mellitus ([place])     this is presumed diagnoses, not confirmed by any studies.   Marland Kitchen SLE (systemic lupus  erythematosus) ([place])     Archie Endo 12/01/2010  . CAP (community acquired pneumonia) 10/2011    Archie Endo 11/08/2011    Past Surgical History  Procedure Laterality Date  . Ectopic pregnancy surgery  1999  . Cardiac catheterization  02/07/12  . Hernia repair    . Esophagogastroduodenoscopy N/A 02/26/2013    Procedure: ESOPHAGOGASTRODUODENOSCOPY (EGD);  Surgeon: Irene Shipper, MD;  Location: [facility] ENDOSCOPY;  Service: Endoscopy;  Laterality: N/A;  . Breast cyst excision Left 08/2005    epidermoid  . Left and right heart catheterization with coronary angiogram N/A 02/07/2012    Procedure: LEFT AND RIGHT HEART CATHETERIZATION WITH CORONARY ANGIOGRAM;  Surgeon: Laverda Page, MD;  Location: [facility] CATH LAB;  Service: Cardiovascular;  Laterality: N/A;  . Umbilical hernia repair  1980's  . Muscle biopsy      for lupus/notes 12/01/2010  . Liver biopsy  03/2013    liver mass/medical hx noted above  . Esophagogastroduodenoscopy N/A 03/02/2015    Procedure: ESOPHAGOGASTRODUODENOSCOPY (EGD);  Surgeon: Milus Banister, MD;  Location: [place];  Service: Endoscopy;  Laterality: N/A;    Social History   Social History  . Marital Status: Single    Spouse Name: N/A  . Number of Children: N/A  . Years of Education: N/A   Occupational History  .      student   Social History Main Topics  . Smoking status:  Former Smoker -- 3 years    Types: Cigarettes    Quit date: 06/01/1990  . Smokeless tobacco: Never Used  . Alcohol Use: No  . Drug Use: No  . Sexual Activity:    Partners: Male    Birth Control/ Protection: None   Other Topics Concern  . Not on file   Social History Narrative   Regular exercise-yes    Current Outpatient Prescriptions on File Prior to Visit  Medication Sig Dispense Refill  . albuterol (PROAIR HFA) 108 (90 BASE) MCG/ACT inhaler Inhale 2 puffs into the lungs every 6 (six) hours as needed for wheezing or shortness of breath.     Marland Kitchen albuterol (PROVENTIL) (5 MG/ML) 0.5% nebulizer solution  Take 2.5 mg by nebulization every 6 (six) hours as needed for wheezing or shortness of breath.   5  . alendronate (FOSAMAX) 70 MG tablet Take 70 mg by mouth every Saturday. Take with a full glass of water on an empty stomach.    Marland Kitchen aspirin EC 81 MG tablet Take 81 mg by mouth daily.    Marland Kitchen azaTHIOprine (IMURAN) 50 MG tablet Take 100 mg by mouth 2 (two) times daily.     . carvedilol (COREG) 3.125 MG tablet Take 3.125 mg by mouth 2 (two) times daily with a meal.    . cetirizine (ZYRTEC) 10 MG tablet Take 10 mg by mouth daily.    . citalopram (CELEXA) 10 MG tablet Take 1 tablet (10 mg total) by mouth every evening. 30 tablet 0  . clopidogrel (PLAVIX) 75 MG tablet Take 75 mg by mouth daily.    . cycloSPORINE (RESTASIS) 0.05 % ophthalmic emulsion Place 1 drop into both eyes 3 (three) times daily.    . famotidine (PEPCID) 20 MG tablet Take 20 mg by mouth 2 (two) times daily.    . fluorometholone (FML) 0.1 % ophthalmic suspension Place 1 drop into both eyes 2 (two) times daily.  0  . HUMULIN R 500 UNIT/ML injection INJECT 12 UNITS (AS MEASURED ON U-100 SYRINGE) SUBCUTANEOUSLY THREE TIMES A DAY WITH MEALS 20 mL 2  . hydroxychloroquine (PLAQUENIL) 200 MG tablet Take 200 mg by mouth 2 (two) times daily.     . insulin regular human CONCENTRATED (HUMULIN R) 500 UNIT/ML SOLN injection INJECT 0.12 MLS (60 UNITS TOTAL) INTO THE SKIN 3 (THREE) TIMES DAILY WITH MEALS. 20 mL 2  . LORazepam (ATIVAN) 1 MG tablet Take 1 tablet (1 mg total) by mouth every 6 (six) hours as needed for anxiety. 10 tablet 0  . losartan (COZAAR) 100 MG tablet Take 100 mg by mouth daily.  3  . megestrol (MEGACE) 40 MG tablet Take 40 mg by mouth daily.  3  . metFORMIN (GLUCOPHAGE-XR) 500 MG 24 hr tablet Take 1,000 mg by mouth 2 (two) times daily.    Marland Kitchen morphine (MSIR) 15 MG tablet Take 30 mg by mouth every evening.     . nitroGLYCERIN (NITROSTAT) 0.4 MG SL tablet Place 0.4 mg under the tongue every 5 (five) minutes x 3 doses as needed for chest  pain.     Marland Kitchen NUVARING 0.12-0.015 MG/24HR vaginal ring Place 1 each vaginally every 21 ( twenty-one) days.   12  . pantoprazole (PROTONIX) 40 MG tablet Take 1 tablet (40 mg total) by mouth daily. 60 tablet 0  . PAZEO 0.7 % SOLN Place 1 drop into both eyes every morning.  3  . Polyvinyl Alcohol-Povidone (REFRESH OP) Place 1 drop into both eyes 2 (two) times  daily.    . potassium chloride (MICRO-K) 10 MEQ CR capsule Take 10 mEq by mouth 2 (two) times daily.   3  . predniSONE (DELTASONE) 5 MG tablet Take 5 mg by mouth daily.    . pregabalin (LYRICA) 75 MG capsule Take 75 mg by mouth 2 (two) times daily.    Marland Kitchen torsemide (DEMADEX) 20 MG tablet Take 1 tablet (20 mg total) by mouth 2 (two) times daily. 30 tablet 0  . isosorbide-hydrALAZINE (BIDIL) 20-37.5 MG per tablet Take 1 tablet by mouth 3 (three) times daily.     No current facility-administered medications on file prior to visit.    Allergies  Allergen Reactions  . Metoclopramide Other (See Comments)    Developed restless leg, akathisia type limb movements.   . Oxycodone-Acetaminophen Hives and Rash    FYI: no reaction to hydrocodone; tolerates dilaudid  . Codeine Itching, Rash and Other (See Comments)    (Derm)    Family History  Problem Relation Age of Onset  . Diabetes Mother   . Hypertension Other   . Stroke Other   . Arthritis Other   . Ovarian cancer Sister 25    maternal half-sister; RAD51C positive  . Breast cancer Maternal Aunt 30    currently 59  . Stomach cancer Maternal Uncle     dx 71s; deceased  . Cancer Paternal Aunt     unk. primary; deceased 46s  . Prostate cancer Paternal Uncle     deceased 72s  . Breast cancer Cousin     deceased 39s; daughter of mat uncle with stomach ca  . Prostate cancer Maternal Uncle     deceased 78s  . Breast cancer Maternal Aunt     deceased 29s  . Cancer Maternal Aunt     unk. primary  . Cancer Maternal Aunt     unk. primary  . Ovarian cancer Maternal Aunt     deceased 39s     BP 137/84 mmHg  Pulse 91  Temp(Src) 98.1 F (36.7 C) (Oral)  Ht 5' 8"  (1.727 m)  Wt 406 lb (184.16 kg)  BMI 61.75 kg/m2  SpO2 97%    Review of Systems She denies hypoglycemia    Objective:   Physical Exam VITAL SIGNS:  See vs page GENERAL: no distress.  Morbid obesity Pulses: dorsalis pedis intact bilat.   MSK: no deformity of the feet CV: trace bilat leg edema Skin:  no ulcer on the feet.  normal color and temp on the feet. Neuro: sensation is intact to touch on the feet.     A1c=7.0%    Assessment & Plan:  DM: well-controlled  Patient is advised the following: Patient Instructions  check your blood sugar 2 times a day.  vary the time of day when you check, between before the 3 meals, and at bedtime.  also check if you have symptoms of your blood sugar being too high or too low.  please keep a record of the readings and bring it to your next appointment here.  please call us sooner if you are having low blood sugar episodes, or if it stays over 200.   Please come back for a follow-up appointment in 3 months.  Please continue the same insulin and metformin for now.

## 2015-07-06 IMAGING — CR DG CHEST 2V
2 series · 2 of 2 positions shown · non-contrast
Comparison: 10/15/2014

CLINICAL DATA: Vomiting

EXAM:
CHEST  2 VIEW

[chest pa]
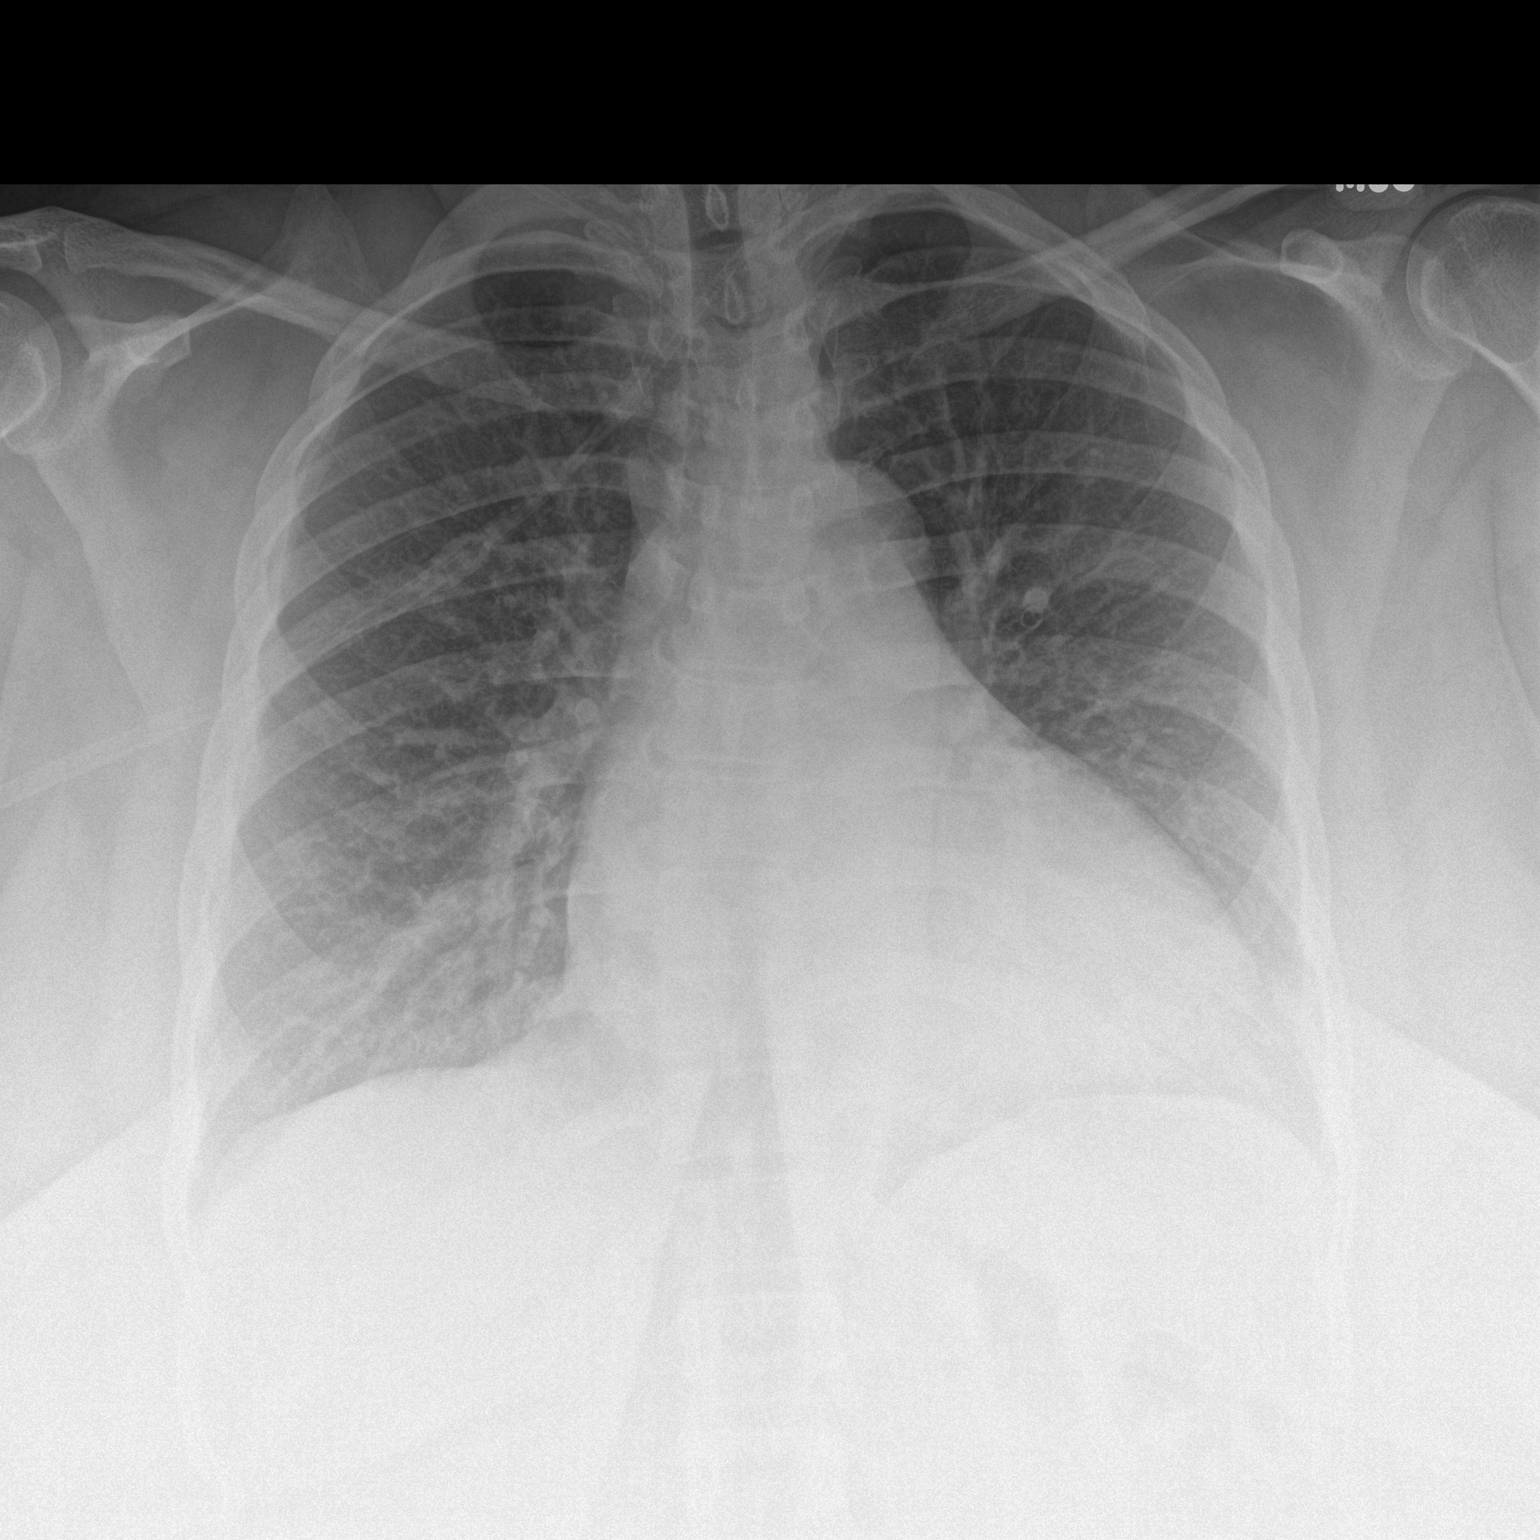

[chest lat]
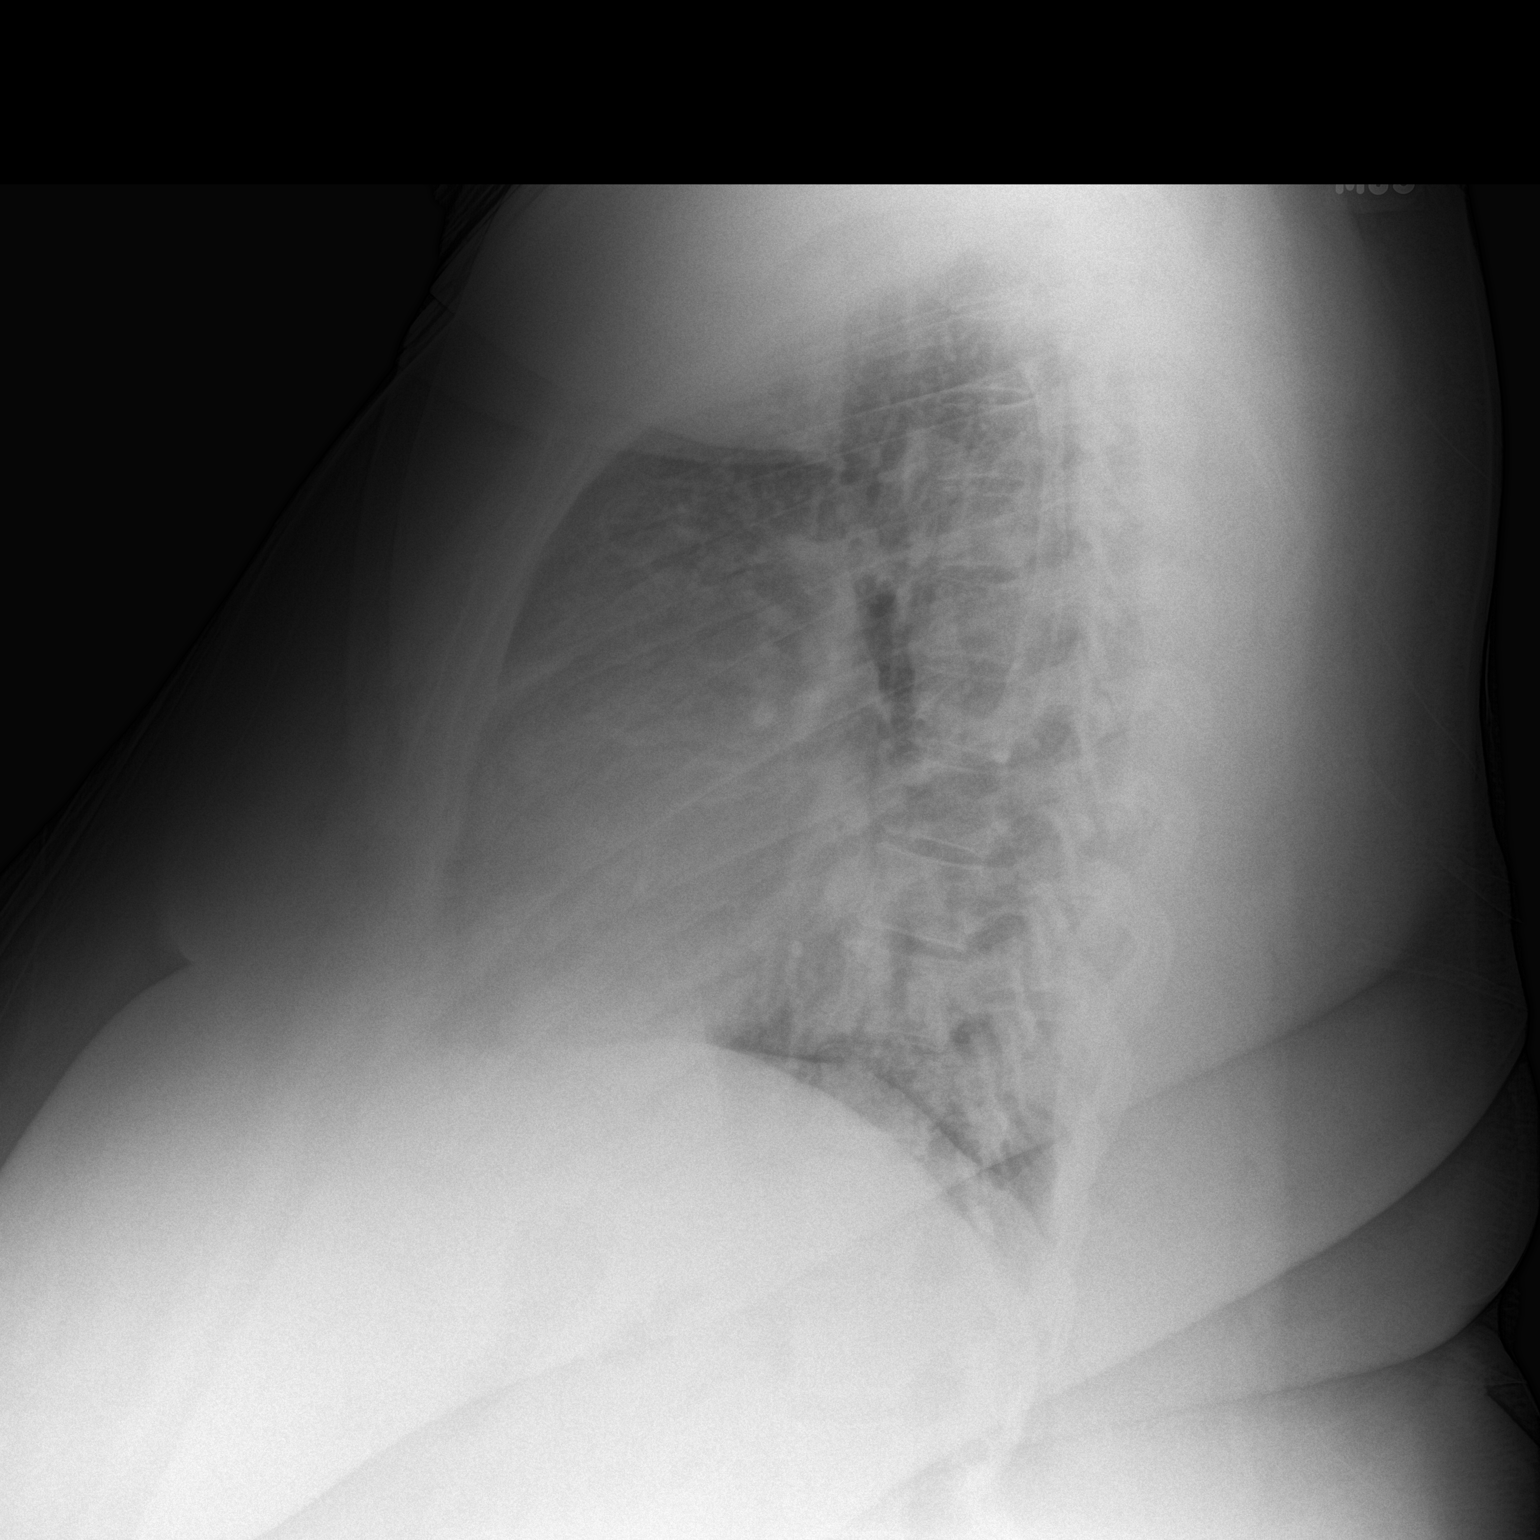

[2 of 2 positions shown; findings below may reference images not displayed]

FINDINGS: Cardiac enlargement with vascular congestion similar to the prior
study. Negative for edema or effusion. Negative for pneumonia.
IMPRESSION: Cardiac enlargement with vascular congestion.  Negative for edema.

## 2015-07-07 ENCOUNTER — Other Ambulatory Visit: Payer: Self-pay | Admitting: Endocrinology

## 2015-08-14 ENCOUNTER — Other Ambulatory Visit: Payer: Self-pay | Admitting: Family Medicine

## 2015-08-14 ENCOUNTER — Ambulatory Visit
Admission: RE | Admit: 2015-08-14 | Discharge: 2015-08-14 | Disposition: A | Discharge status: Home / Self Care | Payer: Medicaid Other | Source: Ambulatory Visit | Attending: Family Medicine | Admitting: Family Medicine

## 2015-08-14 DIAGNOSIS — R52 Pain, unspecified: Secondary | ICD-10-CM

## 2015-09-11 ENCOUNTER — Other Ambulatory Visit: Payer: Self-pay | Admitting: Endocrinology

## 2015-09-11 DIAGNOSIS — G43909 Migraine, unspecified, not intractable, without status migrainosus: Secondary | ICD-10-CM | POA: Insufficient documentation

## 2015-09-18 ENCOUNTER — Encounter: Payer: Self-pay | Admitting: Endocrinology

## 2015-09-18 ENCOUNTER — Ambulatory Visit (INDEPENDENT_AMBULATORY_CARE_PROVIDER_SITE_OTHER): Payer: Medicaid Other | Admitting: Endocrinology

## 2015-09-18 VITALS — BP 148/93 | HR 92 | Temp 98.5°F | Ht 68.0 in | Wt >= 6400 oz

## 2015-09-18 DIAGNOSIS — E1142 Type 2 diabetes mellitus with diabetic polyneuropathy: Secondary | ICD-10-CM | POA: Diagnosis not present

## 2015-09-18 DIAGNOSIS — E1143 Type 2 diabetes mellitus with diabetic autonomic (poly)neuropathy: Secondary | ICD-10-CM | POA: Diagnosis not present

## 2015-09-18 DIAGNOSIS — Z794 Long term (current) use of insulin: Secondary | ICD-10-CM | POA: Diagnosis not present

## 2015-09-18 LAB — POCT GLYCOSYLATED HEMOGLOBIN (HGB A1C): Hemoglobin A1C: 7

## 2015-09-18 LAB — MICROALBUMIN / CREATININE URINE RATIO
Creatinine,U: 270.7 mg/dL
MICROALB/CREAT RATIO: 4 mg/g (ref 0.0–30.0)
Microalb, Ur: 10.7 mg/dL — ABNORMAL HIGH (ref 0.0–1.9)

## 2015-09-18 MED ORDER — GLUCOSE BLOOD VI STRP: 1.0000 | ORAL_STRIP | Freq: Four times a day (QID) | Status: DC

## 2015-09-18 NOTE — Patient Instructions (Addendum)
check your blood sugar 4 times a day: before the 3 meals, and at bedtime.  also check if you have symptoms of your blood sugar being too high or too low.  please keep a record of the readings and bring it to your next appointment here (or you can bring the meter itself).  You can write it on any piece of paper.  please call us sooner if your blood sugar goes below 70, or if you have a lot of readings over 200. Please come back for a follow-up appointment in 3 months.  Please continue the same insulin and metformin for now.   blood and urine tests are requested for you today.  We'll let you know about the results.

## 2015-09-18 NOTE — Progress Notes (Signed)
Subjective:    Patient ID: [name], female    DOB: 03-16-1971, 45 y.o.   MRN: [id number]  HPI Pt returns for f/u of diabetes mellitus: DM type: Insulin-requiring type 2 Dx'ed: 4193 Complications: polyneuropathy and gastroparesis.  Therapy: insulin (since 2014), and metformin.   GDM: never DKA: never Severe hypoglycemia: never Pancreatitis: never Other: she is too ill to undergo weight-loss surgery; she takes multiple daily injections of U-500; in early 2015, she was hospitalized with n/v, due to gastroparesis; pt says she is not at risk for pregnancy.  Interval history: prednisone is still at 5 mg daily.  no cbg record, but states cbg's vary from 170-339.  She says it is highest at lunch, and lowest fasting.  She wants to check qid. Past Medical History  Diagnosis Date  . Bell's palsy 08/09/2010  . SJOGREN'S SYNDROME 08/09/2010  . Hypertension   . High cholesterol   . Anginal pain ([place])   . Obstructive sleep apnea on CPAP 2011  . Anxiety   . GERD (gastroesophageal reflux disease)   . CHF (congestive heart failure) ([place])   . Acute renal failure (HCC)      Intractable nausea vomiting secondary to diabetic gastroparesis causing dehydration and acute renal failure /notes 04/01/2013  . Liver mass 2014    biopsied 03/2013 at [facility], not malignant.  is to undergo radiologic ablation of the mass in sept/October 2014.   . On home oxygen therapy     "2L 24/7" (02/28/2015)  . Migraines     "maybe a couple times/yr" (04/01/2013)  . Stroke [facility]) 2010; 10/2012    "left side is still weak from it, never fully regained full strength; no additions from stroke 10/2012"  . Obesity   . Family history of malignant neoplasm of breast   . Family history of malignant neoplasm of ovary   . Chest pain 01/13/2014  . Diabetic gastroparesis associated with type 2 diabetes mellitus ([place])     this is presumed diagnoses, not confirmed by any studies.   Marland Kitchen SLE (systemic lupus erythematosus) ([place])    Archie Endo 12/01/2010  . CAP (community acquired pneumonia) 10/2011    Archie Endo 11/08/2011    Past Surgical History  Procedure Laterality Date  . Ectopic pregnancy surgery  1999  . Cardiac catheterization  02/07/12  . Hernia repair    . Esophagogastroduodenoscopy N/A 02/26/2013    Procedure: ESOPHAGOGASTRODUODENOSCOPY (EGD);  Surgeon: Irene Shipper, MD;  Location: [facility] ENDOSCOPY;  Service: Endoscopy;  Laterality: N/A;  . Breast cyst excision Left 08/2005    epidermoid  . Left and right heart catheterization with coronary angiogram N/A 02/07/2012    Procedure: LEFT AND RIGHT HEART CATHETERIZATION WITH CORONARY ANGIOGRAM;  Surgeon: Laverda Page, MD;  Location: [facility] CATH LAB;  Service: Cardiovascular;  Laterality: N/A;  . Umbilical hernia repair  1980's  . Muscle biopsy      for lupus/notes 12/01/2010  . Liver biopsy  03/2013    liver mass/medical hx noted above  . Esophagogastroduodenoscopy N/A 03/02/2015    Procedure: ESOPHAGOGASTRODUODENOSCOPY (EGD);  Surgeon: Milus Banister, MD;  Location: [facility];  Service: Endoscopy;  Laterality: N/A;    Social History   Social History  . Marital Status: Single    Spouse Name: N/A  . Number of Children: N/A  . Years of Education: N/A   Occupational History  .      student   Social History Main Topics  . Smoking status: Former Smoker -- 3 years  Types: Cigarettes    Quit date: 06/01/1990  . Smokeless tobacco: Never Used  . Alcohol Use: No  . Drug Use: No  . Sexual Activity:    Partners: Male    Birth Control/ Protection: None   Other Topics Concern  . Not on file   Social History Narrative   Regular exercise-yes    Current Outpatient Prescriptions on File Prior to Visit  Medication Sig Dispense Refill  . albuterol (PROAIR HFA) 108 (90 BASE) MCG/ACT inhaler Inhale 2 puffs into the lungs every 6 (six) hours as needed for wheezing or shortness of breath.     Marland Kitchen albuterol (PROVENTIL) (5 MG/ML) 0.5% nebulizer solution Take 2.5 mg by  nebulization every 6 (six) hours as needed for wheezing or shortness of breath.   5  . alendronate (FOSAMAX) 70 MG tablet Take 70 mg by mouth every Saturday. Take with a full glass of water on an empty stomach.    Marland Kitchen aspirin EC 81 MG tablet Take 81 mg by mouth daily.    Marland Kitchen azaTHIOprine (IMURAN) 50 MG tablet Take 100 mg by mouth 2 (two) times daily.     . BD INSULIN SYRINGE U-500 31G X 6MM 0.5 ML MISC USE TO INJECT INSULIN AS DIRECTED 100 each 2  . carvedilol (COREG) 3.125 MG tablet Take 3.125 mg by mouth 2 (two) times daily with a meal.    . cetirizine (ZYRTEC) 10 MG tablet Take 10 mg by mouth daily.    . citalopram (CELEXA) 10 MG tablet Take 1 tablet (10 mg total) by mouth every evening. 30 tablet 0  . clopidogrel (PLAVIX) 75 MG tablet Take 75 mg by mouth daily.    . cycloSPORINE (RESTASIS) 0.05 % ophthalmic emulsion Place 1 drop into both eyes 3 (three) times daily.    . famotidine (PEPCID) 20 MG tablet Take 20 mg by mouth 2 (two) times daily.    . fluorometholone (FML) 0.1 % ophthalmic suspension Place 1 drop into both eyes 2 (two) times daily.  0  . HUMULIN R 500 UNIT/ML injection INJECT 60 UNITS SUBCUTANEOUSLY THREE TIMES A DAY WITH MEALS (TO BE USED WITH NEW U-500 INSULIN SYRINGES) 20 mL 2  . hydrochlorothiazide (HYDRODIURIL) 25 MG tablet Take 25 mg by mouth daily.    . hydroxychloroquine (PLAQUENIL) 200 MG tablet Take 200 mg by mouth 2 (two) times daily.     . insulin regular human CONCENTRATED (HUMULIN R) 500 UNIT/ML SOLN injection INJECT 0.12 MLS (60 UNITS TOTAL) INTO THE SKIN 3 (THREE) TIMES DAILY WITH MEALS. 20 mL 2  . isosorbide-hydrALAZINE (BIDIL) 20-37.5 MG per tablet Take 1 tablet by mouth 3 (three) times daily.    Marland Kitchen LORazepam (ATIVAN) 1 MG tablet Take 1 tablet (1 mg total) by mouth every 6 (six) hours as needed for anxiety. 10 tablet 0  . losartan (COZAAR) 100 MG tablet Take 100 mg by mouth daily.  3  . megestrol (MEGACE) 40 MG tablet Take 40 mg by mouth daily.  3  . metFORMIN  (GLUCOPHAGE-XR) 500 MG 24 hr tablet Take 1,000 mg by mouth 2 (two) times daily.    Marland Kitchen morphine (MSIR) 15 MG tablet Take 30 mg by mouth every evening.     . nitroGLYCERIN (NITROSTAT) 0.4 MG SL tablet Place 0.4 mg under the tongue every 5 (five) minutes x 3 doses as needed for chest pain.     Marland Kitchen NUVARING 0.12-0.015 MG/24HR vaginal ring Place 1 each vaginally every 21 ( twenty-one) days.   12  .  pantoprazole (PROTONIX) 40 MG tablet Take 1 tablet (40 mg total) by mouth daily. 60 tablet 0  . PAZEO 0.7 % SOLN Place 1 drop into both eyes every morning.  3  . Polyvinyl Alcohol-Povidone (REFRESH OP) Place 1 drop into both eyes 2 (two) times daily.    . potassium chloride (MICRO-K) 10 MEQ CR capsule Take 10 mEq by mouth 2 (two) times daily.   3  . predniSONE (DELTASONE) 5 MG tablet Take 5 mg by mouth daily.    . pregabalin (LYRICA) 75 MG capsule Take 75 mg by mouth 2 (two) times daily.    Marland Kitchen torsemide (DEMADEX) 20 MG tablet Take 1 tablet (20 mg total) by mouth 2 (two) times daily. 30 tablet 0   No current facility-administered medications on file prior to visit.    Allergies  Allergen Reactions  . Metoclopramide Other (See Comments)    Developed restless leg, akathisia type limb movements.   . Oxycodone-Acetaminophen Hives and Rash    FYI: no reaction to hydrocodone; tolerates dilaudid  . Codeine Itching, Rash and Other (See Comments)    (Derm)    Family History  Problem Relation Age of Onset  . Diabetes Mother   . Hypertension Other   . Stroke Other   . Arthritis Other   . Ovarian cancer Sister 84    maternal half-sister; RAD51C positive  . Breast cancer Maternal Aunt 65    currently 65  . Stomach cancer Maternal Uncle     dx 12s; deceased  . Cancer Paternal Aunt     unk. primary; deceased 65s  . Prostate cancer Paternal Uncle     deceased 60s  . Breast cancer Cousin     deceased 22s; daughter of mat uncle with stomach ca  . Prostate cancer Maternal Uncle     deceased 30s  . Breast  cancer Maternal Aunt     deceased 76s  . Cancer Maternal Aunt     unk. primary  . Cancer Maternal Aunt     unk. primary  . Ovarian cancer Maternal Aunt     deceased 57s    BP 148/93 mmHg  Pulse 92  Temp(Src) 98.5 F (36.9 C) (Oral)  Ht 5' 8"  (1.727 m)  Wt 408 lb (185.068 kg)  BMI 62.05 kg/m2  SpO2 95%  Review of Systems She denies hypoglycemia    Objective:   Physical Exam VITAL SIGNS:  See vs page GENERAL: no distress SKIN:  Insulin injection sites at the anterior abdomen are normal.    A1c=7.0%     Assessment & Plan:  DM: pt says glycemic control is not as good as this a1c suggests.  Check fructosamine.   Patient is advised the following: Patient Instructions  check your blood sugar 4 times a day: before the 3 meals, and at bedtime.  also check if you have symptoms of your blood sugar being too high or too low.  please keep a record of the readings and bring it to your next appointment here (or you can bring the meter itself).  You can write it on any piece of paper.  please call us sooner if your blood sugar goes below 70, or if you have a lot of readings over 200. Please come back for a follow-up appointment in 3 months.  Please continue the same insulin and metformin for now.   blood and urine tests are requested for you today.  We'll let you know about the results.

## 2015-09-19 DIAGNOSIS — E119 Type 2 diabetes mellitus without complications: Secondary | ICD-10-CM | POA: Insufficient documentation

## 2015-09-19 DIAGNOSIS — E785 Hyperlipidemia, unspecified: Secondary | ICD-10-CM | POA: Insufficient documentation

## 2015-09-19 DIAGNOSIS — E1169 Type 2 diabetes mellitus with other specified complication: Secondary | ICD-10-CM | POA: Insufficient documentation

## 2015-09-21 LAB — FRUCTOSAMINE: Fructosamine: 240 umol/L (ref 190–270)

## 2015-10-15 IMAGING — DX DG ABD PORTABLE 1V
2 series · 2 of 2 positions shown · non-contrast
Comparison: February 28, 2015

CLINICAL DATA: Nausea

EXAM:
PORTABLE ABDOMEN - 1 VIEW

[abdomen supine (1 of 2)]
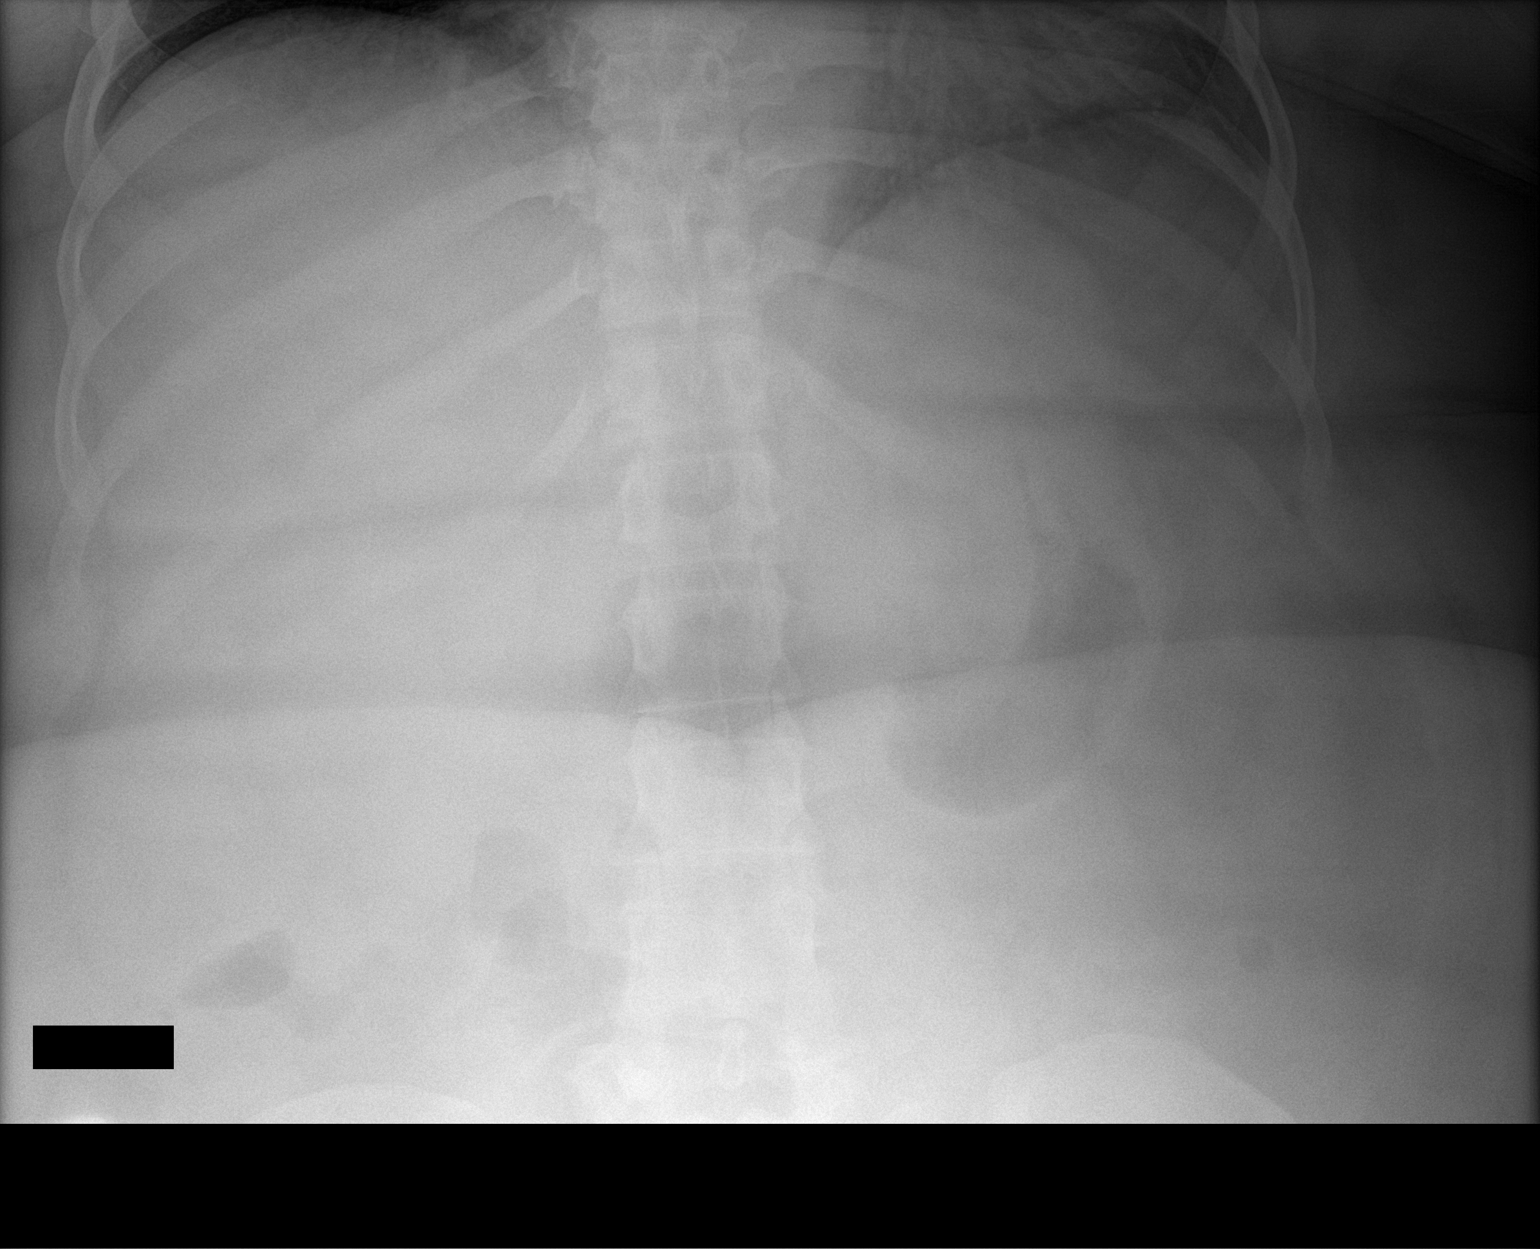

[abdomen supine (2 of 2)]
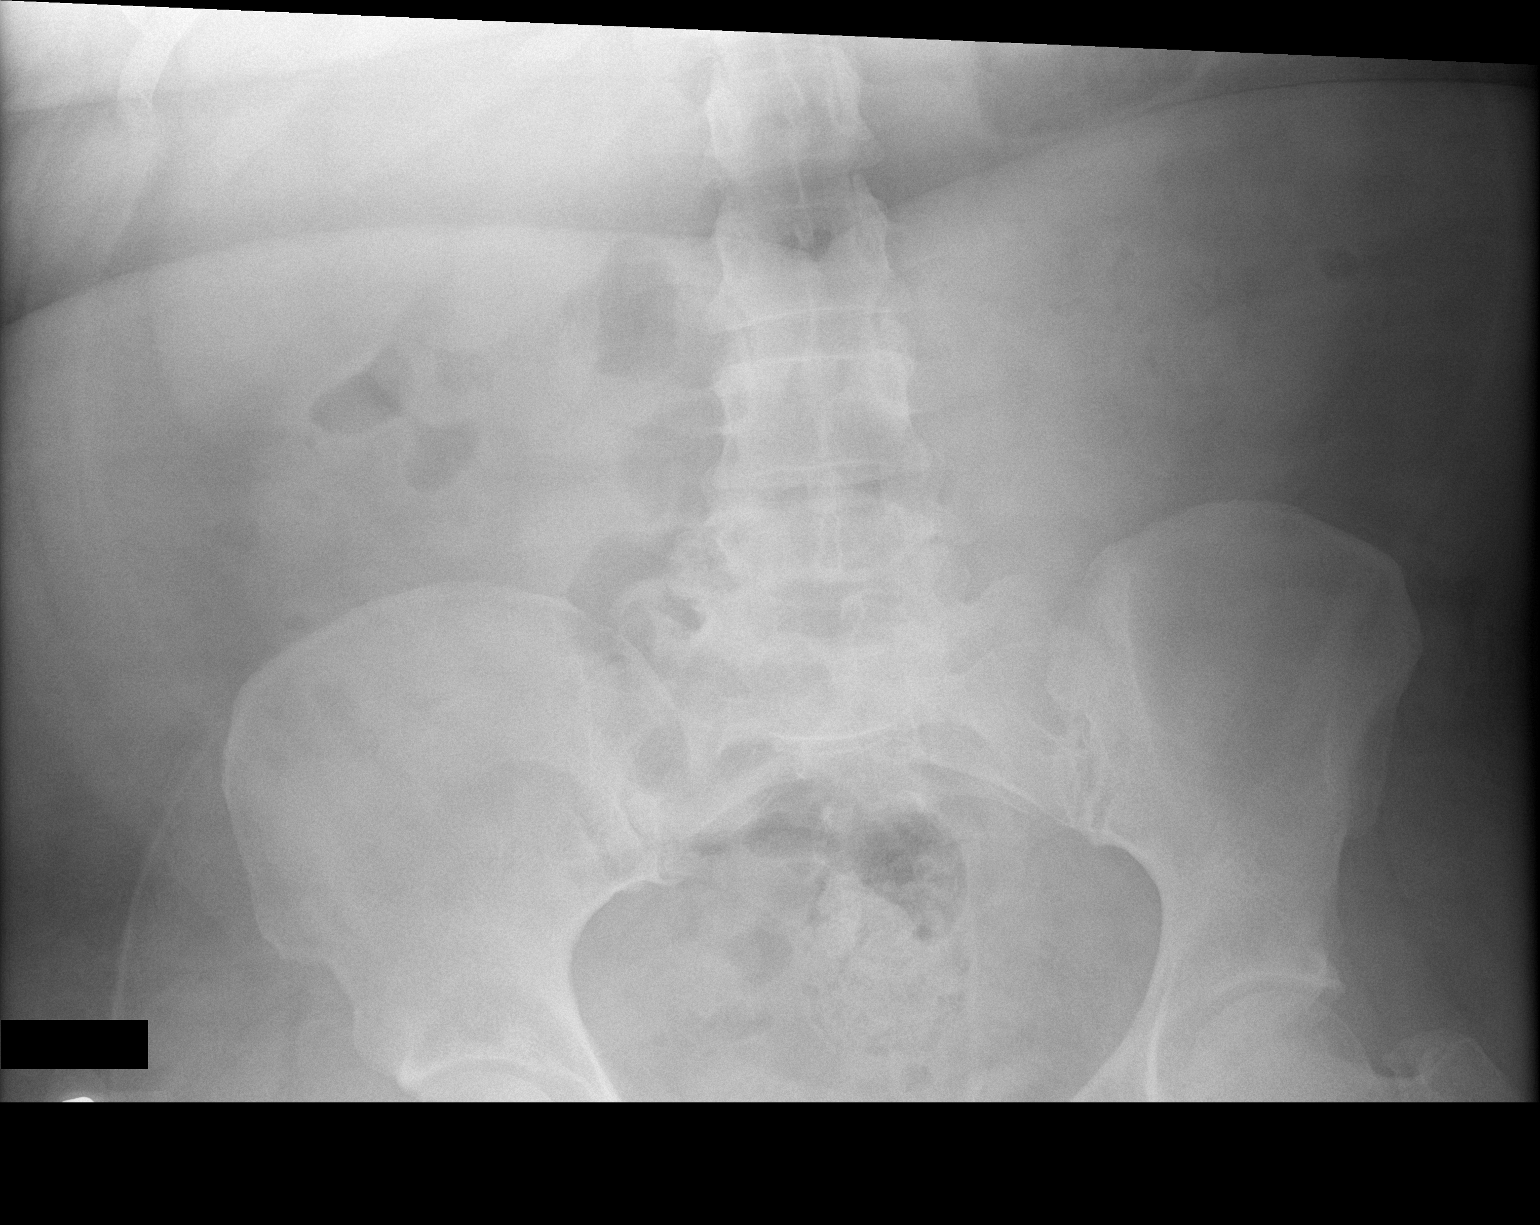

[2 of 2 positions shown; findings below may reference images not displayed]

FINDINGS: There is no bowel dilatation or air-fluid level suggesting
obstruction. No free air is seen on this supine examination. There
is moderate stool in the colon. No abnormal calcifications. Lung
bases are clear.
IMPRESSION: Bowel gas pattern unremarkable.

## 2015-11-23 ENCOUNTER — Emergency Department (HOSPITAL_COMMUNITY): Payer: Medicaid Other

## 2015-11-23 ENCOUNTER — Emergency Department (HOSPITAL_BASED_OUTPATIENT_CLINIC_OR_DEPARTMENT_OTHER)
Admit: 2015-11-23 | Discharge: 2015-11-23 | Disposition: A | Discharge status: Home / Self Care | Payer: Medicaid Other | Attending: Emergency Medicine | Admitting: Emergency Medicine

## 2015-11-23 ENCOUNTER — Emergency Department (HOSPITAL_COMMUNITY)
Admission: EM | Admit: 2015-11-23 | Discharge: 2015-11-23 | Disposition: A | Discharge status: Home / Self Care | Payer: Medicaid Other | Attending: Emergency Medicine | Admitting: Emergency Medicine

## 2015-11-23 ENCOUNTER — Encounter (HOSPITAL_COMMUNITY): Payer: Self-pay | Admitting: Emergency Medicine

## 2015-11-23 DIAGNOSIS — Z87448 Personal history of other diseases of urinary system: Secondary | ICD-10-CM | POA: Insufficient documentation

## 2015-11-23 DIAGNOSIS — Z9889 Other specified postprocedural states: Secondary | ICD-10-CM | POA: Insufficient documentation

## 2015-11-23 DIAGNOSIS — Z8719 Personal history of other diseases of the digestive system: Secondary | ICD-10-CM | POA: Diagnosis not present

## 2015-11-23 DIAGNOSIS — I509 Heart failure, unspecified: Secondary | ICD-10-CM | POA: Insufficient documentation

## 2015-11-23 DIAGNOSIS — E1143 Type 2 diabetes mellitus with diabetic autonomic (poly)neuropathy: Secondary | ICD-10-CM | POA: Insufficient documentation

## 2015-11-23 DIAGNOSIS — M79609 Pain in unspecified limb: Secondary | ICD-10-CM

## 2015-11-23 DIAGNOSIS — Z9981 Dependence on supplemental oxygen: Secondary | ICD-10-CM | POA: Insufficient documentation

## 2015-11-23 DIAGNOSIS — R0602 Shortness of breath: Secondary | ICD-10-CM | POA: Diagnosis not present

## 2015-11-23 DIAGNOSIS — Z7984 Long term (current) use of oral hypoglycemic drugs: Secondary | ICD-10-CM | POA: Insufficient documentation

## 2015-11-23 DIAGNOSIS — Z87891 Personal history of nicotine dependence: Secondary | ICD-10-CM | POA: Insufficient documentation

## 2015-11-23 DIAGNOSIS — Z79899 Other long term (current) drug therapy: Secondary | ICD-10-CM | POA: Diagnosis not present

## 2015-11-23 DIAGNOSIS — M79605 Pain in left leg: Secondary | ICD-10-CM | POA: Diagnosis not present

## 2015-11-23 DIAGNOSIS — Z794 Long term (current) use of insulin: Secondary | ICD-10-CM | POA: Diagnosis not present

## 2015-11-23 DIAGNOSIS — I10 Essential (primary) hypertension: Secondary | ICD-10-CM | POA: Insufficient documentation

## 2015-11-23 DIAGNOSIS — R112 Nausea with vomiting, unspecified: Secondary | ICD-10-CM | POA: Diagnosis not present

## 2015-11-23 DIAGNOSIS — Z8673 Personal history of transient ischemic attack (TIA), and cerebral infarction without residual deficits: Secondary | ICD-10-CM | POA: Insufficient documentation

## 2015-11-23 DIAGNOSIS — Z8701 Personal history of pneumonia (recurrent): Secondary | ICD-10-CM | POA: Insufficient documentation

## 2015-11-23 DIAGNOSIS — G4733 Obstructive sleep apnea (adult) (pediatric): Secondary | ICD-10-CM | POA: Diagnosis not present

## 2015-11-23 DIAGNOSIS — Z8669 Personal history of other diseases of the nervous system and sense organs: Secondary | ICD-10-CM | POA: Insufficient documentation

## 2015-11-23 DIAGNOSIS — M7989 Other specified soft tissue disorders: Secondary | ICD-10-CM | POA: Diagnosis not present

## 2015-11-23 DIAGNOSIS — I209 Angina pectoris, unspecified: Secondary | ICD-10-CM | POA: Diagnosis not present

## 2015-11-23 DIAGNOSIS — Z7982 Long term (current) use of aspirin: Secondary | ICD-10-CM | POA: Diagnosis not present

## 2015-11-23 DIAGNOSIS — G43909 Migraine, unspecified, not intractable, without status migrainosus: Secondary | ICD-10-CM | POA: Insufficient documentation

## 2015-11-23 DIAGNOSIS — R079 Chest pain, unspecified: Secondary | ICD-10-CM | POA: Diagnosis present

## 2015-11-23 DIAGNOSIS — K644 Residual hemorrhoidal skin tags: Secondary | ICD-10-CM | POA: Insufficient documentation

## 2015-11-23 DIAGNOSIS — F419 Anxiety disorder, unspecified: Secondary | ICD-10-CM | POA: Diagnosis not present

## 2015-11-23 LAB — CBC
HEMATOCRIT: 35.4 % — AB (ref 36.0–46.0)
HEMOGLOBIN: 11.3 g/dL — AB (ref 12.0–15.0)
MCH: 27.7 pg (ref 26.0–34.0)
MCHC: 31.9 g/dL (ref 30.0–36.0)
MCV: 86.8 fL (ref 78.0–100.0)
Platelets: 264 10*3/uL (ref 150–400)
RBC: 4.08 MIL/uL (ref 3.87–5.11)
RDW: 15.2 % (ref 11.5–15.5)
WBC: 9 10*3/uL (ref 4.0–10.5)

## 2015-11-23 LAB — BASIC METABOLIC PANEL
ANION GAP: 8 (ref 5–15)
BUN: 15 mg/dL (ref 6–20)
CO2: 23 mmol/L (ref 22–32)
Calcium: 8.6 mg/dL — ABNORMAL LOW (ref 8.9–10.3)
Chloride: 106 mmol/L (ref 101–111)
Creatinine, Ser: 1.01 mg/dL — ABNORMAL HIGH (ref 0.44–1.00)
GFR calc Af Amer: >60 mL/min (ref >60)
GFR calc non Af Amer: >60 mL/min (ref >60)
Glucose, Bld: 172 mg/dL — ABNORMAL HIGH (ref 65–99)
POTASSIUM: 4.8 mmol/L (ref 3.5–5.1)
Sodium: 137 mmol/L (ref 135–145)

## 2015-11-23 LAB — I-STAT TROPONIN, ED: Troponin i, poc: 0 ng/mL (ref 0.00–0.08)

## 2015-11-23 LAB — BRAIN NATRIURETIC PEPTIDE: B Natriuretic Peptide: 31.3 pg/mL (ref 0.0–100.0)

## 2015-11-23 MED ORDER — LORAZEPAM 2 MG/ML IJ SOLN
1.0000 mg | Freq: Once | INTRAMUSCULAR | Status: AC
  Administered 2015-11-23: 1 mg via INTRAVENOUS
  Filled 2015-11-23: qty 1

## 2015-11-23 MED ORDER — SODIUM CHLORIDE 0.9 % IV BOLUS (SEPSIS)
1000.0000 mL | Freq: Once | INTRAVENOUS | Status: AC
  Administered 2015-11-23: 1000 mL via INTRAVENOUS

## 2015-11-23 MED ORDER — MORPHINE SULFATE (PF) 4 MG/ML IV SOLN
4.0000 mg | Freq: Once | INTRAVENOUS | Status: AC
  Administered 2015-11-23: 4 mg via INTRAVENOUS
  Filled 2015-11-23: qty 1

## 2015-11-23 NOTE — ED Provider Notes (Signed)
CSN: [id number]     Arrival date & time 11/23/15  1146 History   First MD Initiated Contact with Patient 11/23/15 1516     Chief Complaint  Patient presents with  . Chest Pain  . Shortness of Breath  . Nausea  . Leg Pain   HPI  [name] is a 45 year old female with an extensive past medical history including lupus, Sjogren's, congestive heart failure and diabetes presenting with multiple complaints. Patient complains of chest pain onset 1 month ago. She states that she always has anterior chest pain but it has been worsening over the past month. She describes it as a soreness that is exacerbated by coughing. She also notes that it seems to worsen when she lies down flat. She states this is typical characteristic of her chest pain but it is now more frequent. She also notes that she has been coughing more than usual. No sputum production. She is also complaining of worsening shortness of breath 1 month. She states that she is typically short of breath at baseline but is now becoming more dyspneic on exertion. She also notes orthopnea and PND at baseline. Reports compliance with her CPAP machine at night. She also complains of left lower leg pain and swelling. She states that she typically has swelling in her lower extremities but noted that her left ankle seems more swollen last week. She received a steroid shot on Thursday from her PCP to reduce inflammation in the joint. She states this has not improved her swelling and she now has sharp, shooting pains in her posterior calf with swelling extending towards her knee. She denies history of blood clots. She is on Plavix. She also notes nausea and vomiting 1 week. She's that she has been unable to keep down her medications due to her severe nausea. Denies associated abdominal pain. Her last complaint is of bright red blood noted on toilet paper when having a bowel movement. She states this happens every time she has had a bowel movement for the past week.  Denies painful bowel movements, constipation, straining or diarrhea. Her cardiologist is Dr. Einar Gip and her endocrinologist is at [facility].     Past Medical History  Diagnosis Date  . Bell's palsy 08/09/2010  . SJOGREN'S SYNDROME 08/09/2010  . Hypertension   . High cholesterol   . Anginal pain ([place])   . Obstructive sleep apnea on CPAP 2011  . Anxiety   . GERD (gastroesophageal reflux disease)   . CHF (congestive heart failure) ([place])   . Acute renal failure (HCC)      Intractable nausea vomiting secondary to diabetic gastroparesis causing dehydration and acute renal failure /notes 04/01/2013  . Liver mass 2014    biopsied 03/2013 at [facility], not malignant.  is to undergo radiologic ablation of the mass in sept/October 2014.   . On home oxygen therapy     "2L 24/7" (02/28/2015)  . Migraines     "maybe a couple times/yr" (04/01/2013)  . Stroke [facility]) 2010; 10/2012    "left side is still weak from it, never fully regained full strength; no additions from stroke 10/2012"  . Obesity   . Family history of malignant neoplasm of breast   . Family history of malignant neoplasm of ovary   . Chest pain 01/13/2014  . Diabetic gastroparesis associated with type 2 diabetes mellitus ([place])     this is presumed diagnoses, not confirmed by any studies.   Marland Kitchen SLE (systemic lupus erythematosus) ([place])     /  notes 12/01/2010  . CAP (community acquired pneumonia) 10/2011    Archie Endo 11/08/2011   Past Surgical History  Procedure Laterality Date  . Ectopic pregnancy surgery  1999  . Cardiac catheterization  02/07/12  . Hernia repair    . Esophagogastroduodenoscopy N/A 02/26/2013    Procedure: ESOPHAGOGASTRODUODENOSCOPY (EGD);  Surgeon: Irene Shipper, MD;  Location: [facility] ENDOSCOPY;  Service: Endoscopy;  Laterality: N/A;  . Breast cyst excision Left 08/2005    epidermoid  . Left and right heart catheterization with coronary angiogram N/A 02/07/2012    Procedure: LEFT AND RIGHT HEART CATHETERIZATION WITH CORONARY ANGIOGRAM;   Surgeon: Laverda Page, MD;  Location: [facility] CATH LAB;  Service: Cardiovascular;  Laterality: N/A;  . Umbilical hernia repair  1980's  . Muscle biopsy      for lupus/notes 12/01/2010  . Liver biopsy  03/2013    liver mass/medical hx noted above  . Esophagogastroduodenoscopy N/A 03/02/2015    Procedure: ESOPHAGOGASTRODUODENOSCOPY (EGD);  Surgeon: Milus Banister, MD;  Location: [facility];  Service: Endoscopy;  Laterality: N/A;   Family History  Problem Relation Age of Onset  . Diabetes Mother   . Hypertension Other   . Stroke Other   . Arthritis Other   . Ovarian cancer Sister 1    maternal half-sister; RAD51C positive  . Breast cancer Maternal Aunt 17    currently 56  . Stomach cancer Maternal Uncle     dx 62s; deceased  . Cancer Paternal Aunt     unk. primary; deceased 50s  . Prostate cancer Paternal Uncle     deceased 34s  . Breast cancer Cousin     deceased 79s; daughter of mat uncle with stomach ca  . Prostate cancer Maternal Uncle     deceased 73s  . Breast cancer Maternal Aunt     deceased 61s  . Cancer Maternal Aunt     unk. primary  . Cancer Maternal Aunt     unk. primary  . Ovarian cancer Maternal Aunt     deceased 29s   Social History  Substance Use Topics  . Smoking status: Former Smoker -- 3 years    Types: Cigarettes    Quit date: 06/01/1990  . Smokeless tobacco: Never Used  . Alcohol Use: No   OB History    Gravida Para Term Preterm AB TAB SAB Ectopic Multiple Living   2 1 1  1   1  1      Review of Systems  All other systems reviewed and are negative.     Allergies  Metoclopramide; Oxycodone-acetaminophen; and Codeine  Home Medications   Prior to Admission medications   Medication Sig Start Date End Date Taking? Authorizing Provider  albuterol (PROAIR HFA) 108 (90 BASE) MCG/ACT inhaler Inhale 2 puffs into the lungs every 6 (six) hours as needed for wheezing or shortness of breath.    Yes Historical Provider, MD  albuterol (PROVENTIL) (5  MG/ML) 0.5% nebulizer solution Take 2.5 mg by nebulization every 6 (six) hours as needed for wheezing or shortness of breath.  06/24/14  Yes Historical Provider, MD  alendronate (FOSAMAX) 70 MG tablet Take 70 mg by mouth every Saturday. Take with a full glass of water on an empty stomach.   Yes Historical Provider, MD  ALPRAZolam Duanne Moron) 0.5 MG tablet Take 0.5 mg by mouth 2 (two) times daily.   Yes Historical Provider, MD  aspirin EC 81 MG tablet Take 81 mg by mouth daily.   Yes Historical Provider, MD  azaTHIOprine (IMURAN) 50 MG tablet Take 100 mg by mouth 2 (two) times daily.    Yes Historical Provider, MD  carvedilol (COREG) 6.25 MG tablet Take 6.25 mg by mouth 2 (two) times daily with a meal.   Yes Historical Provider, MD  cetirizine (ZYRTEC) 10 MG tablet Take 10 mg by mouth daily.   Yes Historical Provider, MD  clopidogrel (PLAVIX) 75 MG tablet Take 75 mg by mouth daily.   Yes Historical Provider, MD  clotrimazole-betamethasone (LOTRISONE) cream Apply 1 application topically daily. To feet   Yes Historical Provider, MD  cycloSPORINE (RESTASIS) 0.05 % ophthalmic emulsion Place 1 drop into both eyes 3 (three) times daily.   Yes Historical Provider, MD  famotidine (PEPCID) 20 MG tablet Take 20 mg by mouth 2 (two) times daily.   Yes Historical Provider, MD  fluorometholone (FML) 0.1 % ophthalmic suspension Place 1 drop into both eyes 2 (two) times daily. 02/11/15  Yes Historical Provider, MD  HUMULIN R 500 UNIT/ML injection INJECT 60 UNITS SUBCUTANEOUSLY THREE TIMES A DAY WITH MEALS (TO BE USED WITH NEW U-500 INSULIN SYRINGES) 09/14/15  Yes Renato Shin, MD  hydrochlorothiazide (HYDRODIURIL) 25 MG tablet Take 25 mg by mouth daily.   Yes Historical Provider, MD  hydroxychloroquine (PLAQUENIL) 200 MG tablet Take 200 mg by mouth 2 (two) times daily.    Yes Historical Provider, MD  megestrol (MEGACE) 40 MG tablet Take 40 mg by mouth daily. 01/30/15  Yes Historical Provider, MD  metFORMIN (GLUCOPHAGE-XR)  500 MG 24 hr tablet Take 1,000 mg by mouth 2 (two) times daily.   Yes Historical Provider, MD  morphine (MSIR) 15 MG tablet Take 30 mg by mouth every evening.    Yes Historical Provider, MD  nitroGLYCERIN (NITROSTAT) 0.4 MG SL tablet Place 0.4 mg under the tongue every 5 (five) minutes x 3 doses as needed for chest pain.  02/26/15 02/26/16 Yes Historical Provider, MD  NUVARING 0.12-0.015 MG/24HR vaginal ring Place 1 each vaginally every 21 ( twenty-one) days.  01/28/15  Yes Historical Provider, MD  PAZEO 0.7 % SOLN Place 1 drop into both eyes every morning. 02/11/15  Yes Historical Provider, MD  Polyvinyl Alcohol-Povidone (REFRESH OP) Place 1 drop into both eyes 2 (two) times daily.   Yes Historical Provider, MD  potassium chloride SA (K-DUR,KLOR-CON) 20 MEQ tablet Take 20 mEq by mouth 2 (two) times daily.   Yes Historical Provider, MD  predniSONE (DELTASONE) 5 MG tablet Take 5 mg by mouth daily. 02/25/15  Yes Historical Provider, MD  pregabalin (LYRICA) 75 MG capsule Take 75 mg by mouth 2 (two) times daily. 01/28/15  Yes Historical Provider, MD  torsemide (DEMADEX) 20 MG tablet Take 1 tablet (20 mg total) by mouth 2 (two) times daily. 04/06/13  Yes Belkys A Regalado, MD  BD INSULIN SYRINGE U-500 31G X 6MM 0.5 ML MISC USE TO INJECT INSULIN AS DIRECTED 07/08/15   Renato Shin, MD  glucose blood (ACCU-CHEK AVIVA) test strip 1 each by Other route 4 (four) times daily. And lancets 4/day 09/18/15   Renato Shin, MD  LORazepam (ATIVAN) 1 MG tablet Take 1 tablet (1 mg total) by mouth every 6 (six) hours as needed for anxiety. Patient not taking: Reported on 11/23/2015 11/22/14   Montine Circle, PA-C   BP 122/65 mmHg  Pulse 79  Temp(Src) 98.6 F (37 C) (Oral)  Resp 22  Ht 5' 8"  (1.727 m)  Wt 180.078 kg  BMI 60.38 kg/m2  SpO2 95% Physical Exam  Constitutional: She  appears well-developed and well-nourished. No distress.  Morbidly obese, appears older than stated age  HENT:  Head: Normocephalic and atraumatic.   Mouth/Throat: Oropharynx is clear and moist.  Eyes: Conjunctivae and EOM are normal. Pupils are equal, round, and reactive to light. Right eye exhibits no discharge. Left eye exhibits no discharge. No scleral icterus.  Neck: Normal range of motion. Neck supple.  Cardiovascular: Normal rate, regular rhythm, normal heart sounds and intact distal pulses.   Pedal pulse palpable  Pulmonary/Chest: Effort normal and breath sounds normal. No respiratory distress.  Abdominal: Soft. Bowel sounds are normal. She exhibits no distension. There is no tenderness. There is no rebound and no guarding.  Obese  Genitourinary: Rectal exam shows external hemorrhoid. Rectal exam shows no fissure. Guaiac negative stool.  One non-thrombosed hemorrhoid noted on rectal. No frank blood per rectum. No stool in rectum. Negative hemoccult.   Musculoskeletal: Normal range of motion.  TTP of left posterior calf and ankle. Mild non-pitting edema from ankle to knee bilaterally with slightly increased L> R. FROM of left lower extremity intact. No erythema or warmth of ankle. No posterior calf erythema, induration, warmth or palpable cords.   Neurological: She is alert. Coordination normal.  5/5 strength of BLE. Sensation to light touch intact over BLE.   Skin: Skin is warm and dry. No erythema.  Psychiatric: She has a normal mood and affect. Her behavior is normal.  Nursing note and vitals reviewed.   ED Course  Procedures (including critical care time) Labs Review Labs Reviewed  BASIC METABOLIC PANEL - Abnormal; Notable for the following:    Glucose, Bld 172 (*)    Creatinine, Ser 1.01 (*)    Calcium 8.6 (*)    All other components within normal limits  CBC - Abnormal; Notable for the following:    Hemoglobin 11.3 (*)    HCT 35.4 (*)    All other components within normal limits  BRAIN NATRIURETIC PEPTIDE  I-STAT TROPOININ, ED  POC OCCULT BLOOD, ED    Imaging Review Dg Chest 2 View  11/23/2015  CLINICAL DATA:   Sharp pain and swelling to left lower leg today EXAM: CHEST  2 VIEW COMPARISON:  11/21/2014 FINDINGS: Stable mild cardiac enlargement. Vascular pattern normal. Lungs clear. No pleural effusion. IMPRESSION: No acute findings Electronically Signed   By: Skipper Cliche M.D.   On: 11/23/2015 12:48   I have personally reviewed and evaluated these images and lab results as part of my medical decision-making.   EKG Interpretation None      MDM   Final diagnoses:  Chest pain, unspecified chest pain type  Non-intractable vomiting with nausea, vomiting of unspecified type  Left leg pain   45 year old female presenting with chest pain x 1 month, SOB x 1 month, nausea, vomiting and left leg pain. Afebrile, no hypoxia, no tachycardia, and hemodynamically stable. Pt is non-toxic appearing in NAD. Heart RRR. Lungs CTAB. Abdomen is soft, non-tender without peritoneal signs. Non-thrombosed hemorrhoid on rectal. Left posterior calf and ankle are TTP without erythema or warmth. Mild increase in swelling of L>R leg. Left leg is neurovascularly intact with FROM. No signs of septic joint. No leukocytosis. Hgb is at baseline. Creatinine very mildly elevated at 1.01. Troponin 0.00 with non-ischemic ECG. BNP 31. CXR negative. DVT study negative. Hemoccult negative. Pt has history of QT prolongation so unable to use zofran. Pt is allergic to reglan and phenergan is unavailable. Nausea treated with ativan and smelling alcohol swabs with some  relief. Pt given fluid bolus and monitored in ED with no episodes of vomiting. Pt is reassured by lab work and requesting discharge home to rest and states understanding of why we cannot provide antiemetics. Given pt's length of CP and SOB, unlikely to be ACS. Pt ambulates with steady gait, unassisted and maintaining O2 above 95% before discharge. Encouraged pt to follow up with her cardiologist in regards to today's visit.  At this time there does not appear to be any evidence of an  acute emergency medical condition and the patient appears stable for discharge with appropriate outpatient follow up. Diagnosis was discussed with patient who verbalizes understanding and is agreeable to discharge. Pt case discussed with Dr. Thurnell Garbe who agrees with my plan. Return precautions given in discharge paperwork and discussed with pt at bedside. Pt is stable for discharge.       Lahoma Crocker Dave Mannes, PA-C 11/23/15 2031  Francine Graven, DO 11/25/15 1611

## 2015-11-23 NOTE — ED Notes (Signed)
Sharp pain and swelling to left lower leg. Also c/o chest pain, SOB, nausea, blood in stool since last week. Hx of lupus, on plavix currently. Says she received a steroid shot Thursday for the swelling in her ankles.

## 2015-11-23 NOTE — Discharge Instructions (Signed)
Schedule follow up appointments with your PCP and cardiologist. Return to ED with new, worsening or concerning symptoms.    Chest Pain Observation It is often hard to give a specific diagnosis for the cause of chest pain. Among other possibilities your symptoms might be caused by inadequate oxygen delivery to your heart (angina). Angina that is not treated or evaluated can lead to a heart attack (myocardial infarction) or death. Blood tests, electrocardiograms, and X-rays may have been done to help determine a possible cause of your chest pain. After evaluation and observation, your health care provider has determined that it is unlikely your pain was caused by an unstable condition that requires hospitalization. However, a full evaluation of your pain may need to be completed, with additional diagnostic testing as directed. It is very important to keep your follow-up appointments. Not keeping your follow-up appointments could result in permanent heart damage, disability, or death. If there is any problem keeping your follow-up appointments, you must call your health care provider. HOME CARE INSTRUCTIONS  Due to the slight chance that your pain could be angina, it is important to follow your health care provider's treatment plan and also maintain a healthy lifestyle:  Maintain or work toward achieving a healthy weight.  Stay physically active and exercise regularly.  Decrease your salt intake.  Eat a balanced, healthy diet. Talk to a dietitian to learn about heart-healthy foods.  Increase your fiber intake by including whole grains, vegetables, fruits, and nuts in your diet.  Avoid situations that cause stress, anger, or depression.  Take medicines as advised by your health care provider. Report any side effects to your health care provider. Do not stop medicines or adjust the dosages on your own.  Quit smoking. Do not use nicotine patches or gum until you check with your health care  provider.  Keep your blood pressure, blood sugar, and cholesterol levels within normal limits.  Limit alcohol intake to no more than 1 drink per day for women who are not pregnant and 2 drinks per day for men.  Do not abuse drugs. SEEK IMMEDIATE MEDICAL CARE IF: You have severe chest pain or pressure which may include symptoms such as:  You feel pain or pressure in your arms, neck, jaw, or back.  You have severe back or abdominal pain, feel sick to your stomach (nauseous), or throw up (vomit).  You are sweating profusely.  You are having a fast or irregular heartbeat.  You feel short of breath while at rest.  You notice increasing shortness of breath during rest, sleep, or with activity.  You have chest pain that does not get better after rest or after taking your usual medicine.  You wake from sleep with chest pain.  You are unable to sleep because you cannot breathe.  You develop a frequent cough or you are coughing up blood.  You feel dizzy, faint, or experience extreme fatigue.  You develop severe weakness, dizziness, fainting, or chills. Any of these symptoms may represent a serious problem that is an emergency. Do not wait to see if the symptoms will go away. Call your local emergency services (911 in the [place] Do not drive yourself to the hospital. MAKE SURE YOU:  Understand these instructions.  Will watch your condition.  Will get help right away if you are not doing well or get worse.   This information is not intended to replace advice given to you by your health care provider. Make sure you discuss any questions  you have with your health care provider.   Document Released: 08/20/2010 Document Revised: 07/23/2013 Document Reviewed: 01/17/2013 Elsevier Interactive Patient Education 2016 Elsevier Inc[employer]  Nausea and Vomiting Nausea is a sick feeling that often comes before throwing up (vomiting). Vomiting is a reflex where stomach contents come out of your  mouth. Vomiting can cause severe loss of body fluids (dehydration). Children and elderly adults can become dehydrated quickly, especially if they also have diarrhea. Nausea and vomiting are symptoms of a condition or disease. It is important to find the cause of your symptoms. CAUSES   Direct irritation of the stomach lining. This irritation can result from increased acid production (gastroesophageal reflux disease), infection, food poisoning, taking certain medicines (such as nonsteroidal anti-inflammatory drugs), alcohol use, or tobacco use.  Signals from the brain.These signals could be caused by a headache, heat exposure, an inner ear disturbance, increased pressure in the brain from injury, infection, a tumor, or a concussion, pain, emotional stimulus, or metabolic problems.  An obstruction in the gastrointestinal tract (bowel obstruction).  Illnesses such as diabetes, hepatitis, gallbladder problems, appendicitis, kidney problems, cancer, sepsis, atypical symptoms of a heart attack, or eating disorders.  Medical treatments such as chemotherapy and radiation.  Receiving medicine that makes you sleep (general anesthetic) during surgery. DIAGNOSIS Your caregiver may ask for tests to be done if the problems do not improve after a few days. Tests may also be done if symptoms are severe or if the reason for the nausea and vomiting is not clear. Tests may include:  Urine tests.  Blood tests.  Stool tests.  Cultures (to look for evidence of infection).  X-rays or other imaging studies. Test results can help your caregiver make decisions about treatment or the need for additional tests. TREATMENT You need to stay well hydrated. Drink frequently but in small amounts.You may wish to drink water, sports drinks, clear broth, or eat frozen ice pops or gelatin dessert to help stay hydrated.When you eat, eating slowly may help prevent nausea.There are also some antinausea medicines that may  help prevent nausea. HOME CARE INSTRUCTIONS   Take all medicine as directed by your caregiver.  If you do not have an appetite, do not force yourself to eat. However, you must continue to drink fluids.  If you have an appetite, eat a normal diet unless your caregiver tells you differently.  Eat a variety of complex carbohydrates (rice, wheat, potatoes, bread), lean meats, yogurt, fruits, and vegetables.  Avoid high-fat foods because they are more difficult to digest.  Drink enough water and fluids to keep your urine clear or pale yellow.  If you are dehydrated, ask your caregiver for specific rehydration instructions. Signs of dehydration may include:  Severe thirst.  Dry lips and mouth.  Dizziness.  Dark urine.  Decreasing urine frequency and amount.  Confusion.  Rapid breathing or pulse. SEEK IMMEDIATE MEDICAL CARE IF:   You have blood or brown flecks (like coffee grounds) in your vomit.  You have black or bloody stools.  You have a severe headache or stiff neck.  You are confused.  You have severe abdominal pain.  You have chest pain or trouble breathing.  You do not urinate at least once every 8 hours.  You develop cold or clammy skin.  You continue to vomit for longer than 24 to 48 hours.  You have a fever. MAKE SURE YOU:   Understand these instructions.  Will watch your condition.  Will get help right away if you  are not doing well or get worse.   This information is not intended to replace advice given to you by your health care provider. Make sure you discuss any questions you have with your health care provider.   Document Released: 07/18/2005 Document Revised: 10/10/2011 Document Reviewed: 12/15/2010 Elsevier Interactive Patient Education [employer].

## 2015-11-23 NOTE — Progress Notes (Signed)
VASCULAR LAB PRELIMINARY  PRELIMINARY  PRELIMINARY  PRELIMINARY  Left lower extremity venous duplex completed.    Preliminary report:  Left:  No evidence of DVT, superficial thrombosis, or Baker's cyst.  Dennie Vecchio, RVT 11/23/2015, 4:18 PM

## 2015-12-16 ENCOUNTER — Ambulatory Visit: Payer: Medicaid Other | Admitting: Endocrinology

## 2015-12-18 ENCOUNTER — Ambulatory Visit: Payer: Medicaid Other | Admitting: Endocrinology

## 2015-12-18 ENCOUNTER — Encounter: Payer: Self-pay | Admitting: Endocrinology

## 2015-12-18 ENCOUNTER — Ambulatory Visit (INDEPENDENT_AMBULATORY_CARE_PROVIDER_SITE_OTHER): Payer: Medicaid Other | Admitting: Endocrinology

## 2015-12-18 VITALS — BP 134/86 | HR 92 | Temp 98.4°F | Wt >= 6400 oz

## 2015-12-18 DIAGNOSIS — E119 Type 2 diabetes mellitus without complications: Secondary | ICD-10-CM

## 2015-12-18 LAB — POCT GLYCOSYLATED HEMOGLOBIN (HGB A1C): Hemoglobin A1C: 8.3

## 2015-12-18 MED ORDER — INSULIN REGULAR HUMAN (CONC) 500 UNIT/ML ~~LOC~~ SOLN: SUBCUTANEOUS | Status: DC

## 2015-12-18 NOTE — Patient Instructions (Addendum)
check your blood sugar 4 times a day: before the 3 meals, and at bedtime.  also check if you have symptoms of your blood sugar being too high or too low.  please keep a record of the readings and bring it to your next appointment here (or you can bring the meter itself).  You can write it on any piece of paper.  please call us sooner if your blood sugar goes below 70, or if you have a lot of readings over 200. Please come back for a follow-up appointment in 3 months.  Please increase the insulin to 3 times a day (just before each meal) 70-70-60 units

## 2015-12-18 NOTE — Progress Notes (Signed)
Pre visit review using our clinic review tool, if applicable. No additional management support is needed unless otherwise documented below in the visit note. 

## 2015-12-18 NOTE — Progress Notes (Signed)
Subjective:    Patient ID: [name], female    DOB: 06/21/1971, 45 y.o.   MRN: [id number]  HPI Pt returns for f/u of diabetes mellitus: DM type: Insulin-requiring type 2 Dx'ed: 3382 Complications: polyneuropathy and gastroparesis.  Therapy: insulin (since 2014), and metformin.   GDM: never DKA: never Severe hypoglycemia: never Pancreatitis: never Other: she is too ill to undergo weight-loss surgery; she takes multiple daily injections of U-500; in early 2015, she was hospitalized with n/v, due to gastroparesis; pt says she is not at risk for pregnancy.   Interval history: prednisone is still at 5 mg daily.  no cbg record, but states cbg's are well-controlled.  She says it is highest at lunch, and lowest fasting.   Past Medical History  Diagnosis Date  . Bell's palsy 08/09/2010  . SJOGREN'S SYNDROME 08/09/2010  . Hypertension   . High cholesterol   . Anginal pain ([place])   . Obstructive sleep apnea on CPAP 2011  . Anxiety   . GERD (gastroesophageal reflux disease)   . CHF (congestive heart failure) ([place])   . Acute renal failure (HCC)      Intractable nausea vomiting secondary to diabetic gastroparesis causing dehydration and acute renal failure /notes 04/01/2013  . Liver mass 2014    biopsied 03/2013 at [facility], not malignant.  is to undergo radiologic ablation of the mass in sept/October 2014.   . On home oxygen therapy     "2L 24/7" (02/28/2015)  . Migraines     "maybe a couple times/yr" (04/01/2013)  . Stroke [facility]) 2010; 10/2012    "left side is still weak from it, never fully regained full strength; no additions from stroke 10/2012"  . Obesity   . Family history of malignant neoplasm of breast   . Family history of malignant neoplasm of ovary   . Chest pain 01/13/2014  . Diabetic gastroparesis associated with type 2 diabetes mellitus ([place])     this is presumed diagnoses, not confirmed by any studies.   Marland Kitchen SLE (systemic lupus erythematosus) ([place])     Archie Endo 12/01/2010  .  CAP (community acquired pneumonia) 10/2011    Archie Endo 11/08/2011    Past Surgical History  Procedure Laterality Date  . Ectopic pregnancy surgery  1999  . Cardiac catheterization  02/07/12  . Hernia repair    . Esophagogastroduodenoscopy N/A 02/26/2013    Procedure: ESOPHAGOGASTRODUODENOSCOPY (EGD);  Surgeon: Irene Shipper, MD;  Location: [facility] ENDOSCOPY;  Service: Endoscopy;  Laterality: N/A;  . Breast cyst excision Left 08/2005    epidermoid  . Left and right heart catheterization with coronary angiogram N/A 02/07/2012    Procedure: LEFT AND RIGHT HEART CATHETERIZATION WITH CORONARY ANGIOGRAM;  Surgeon: Laverda Page, MD;  Location: [facility] CATH LAB;  Service: Cardiovascular;  Laterality: N/A;  . Umbilical hernia repair  1980's  . Muscle biopsy      for lupus/notes 12/01/2010  . Liver biopsy  03/2013    liver mass/medical hx noted above  . Esophagogastroduodenoscopy N/A 03/02/2015    Procedure: ESOPHAGOGASTRODUODENOSCOPY (EGD);  Surgeon: Milus Banister, MD;  Location: [place];  Service: Endoscopy;  Laterality: N/A;    Social History   Social History  . Marital Status: Single    Spouse Name: N/A  . Number of Children: N/A  . Years of Education: N/A   Occupational History  .      student   Social History Main Topics  . Smoking status: Former Smoker -- 3 years  Types: Cigarettes    Quit date: 06/01/1990  . Smokeless tobacco: Never Used  . Alcohol Use: No  . Drug Use: No  . Sexual Activity:    Partners: Male    Birth Control/ Protection: None   Other Topics Concern  . Not on file   Social History Narrative   Regular exercise-yes    Current Outpatient Prescriptions on File Prior to Visit  Medication Sig Dispense Refill  . albuterol (PROAIR HFA) 108 (90 BASE) MCG/ACT inhaler Inhale 2 puffs into the lungs every 6 (six) hours as needed for wheezing or shortness of breath.     Marland Kitchen albuterol (PROVENTIL) (5 MG/ML) 0.5% nebulizer solution Take 2.5 mg by nebulization every 6 (six)  hours as needed for wheezing or shortness of breath.   5  . alendronate (FOSAMAX) 70 MG tablet Take 70 mg by mouth every Saturday. Take with a full glass of water on an empty stomach.    . ALPRAZolam (XANAX) 0.5 MG tablet Take 0.5 mg by mouth 2 (two) times daily.    Marland Kitchen aspirin EC 81 MG tablet Take 81 mg by mouth daily.    Marland Kitchen azaTHIOprine (IMURAN) 50 MG tablet Take 100 mg by mouth 2 (two) times daily.     . BD INSULIN SYRINGE U-500 31G X 6MM 0.5 ML MISC USE TO INJECT INSULIN AS DIRECTED 100 each 2  . carvedilol (COREG) 6.25 MG tablet Take 6.25 mg by mouth 2 (two) times daily with a meal.    . cetirizine (ZYRTEC) 10 MG tablet Take 10 mg by mouth daily.    . clopidogrel (PLAVIX) 75 MG tablet Take 75 mg by mouth daily.    . clotrimazole-betamethasone (LOTRISONE) cream Apply 1 application topically daily. To feet    . cycloSPORINE (RESTASIS) 0.05 % ophthalmic emulsion Place 1 drop into both eyes 3 (three) times daily.    . famotidine (PEPCID) 20 MG tablet Take 20 mg by mouth 2 (two) times daily.    . fluorometholone (FML) 0.1 % ophthalmic suspension Place 1 drop into both eyes 2 (two) times daily.  0  . glucose blood (ACCU-CHEK AVIVA) test strip 1 each by Other route 4 (four) times daily. And lancets 4/day 120 each 12  . hydrochlorothiazide (HYDRODIURIL) 25 MG tablet Take 25 mg by mouth daily.    . hydroxychloroquine (PLAQUENIL) 200 MG tablet Take 200 mg by mouth 2 (two) times daily.     . megestrol (MEGACE) 40 MG tablet Take 40 mg by mouth daily.  3  . metFORMIN (GLUCOPHAGE-XR) 500 MG 24 hr tablet Take 1,000 mg by mouth 2 (two) times daily.    Marland Kitchen morphine (MSIR) 15 MG tablet Take 30 mg by mouth every evening.     . nitroGLYCERIN (NITROSTAT) 0.4 MG SL tablet Place 0.4 mg under the tongue every 5 (five) minutes x 3 doses as needed for chest pain.     Marland Kitchen NUVARING 0.12-0.015 MG/24HR vaginal ring Place 1 each vaginally every 21 ( twenty-one) days.   12  . PAZEO 0.7 % SOLN Place 1 drop into both eyes every  morning.  3  . Polyvinyl Alcohol-Povidone (REFRESH OP) Place 1 drop into both eyes 2 (two) times daily.    . potassium chloride SA (K-DUR,KLOR-CON) 20 MEQ tablet Take 20 mEq by mouth 2 (two) times daily.    . predniSONE (DELTASONE) 5 MG tablet Take 5 mg by mouth daily.    . pregabalin (LYRICA) 75 MG capsule Take 75 mg by mouth 2 (two)  times daily.    Marland Kitchen torsemide (DEMADEX) 20 MG tablet Take 1 tablet (20 mg total) by mouth 2 (two) times daily. 30 tablet 0  . LORazepam (ATIVAN) 1 MG tablet Take 1 tablet (1 mg total) by mouth every 6 (six) hours as needed for anxiety. (Patient not taking: Reported on 11/23/2015) 10 tablet 0   No current facility-administered medications on file prior to visit.    Allergies  Allergen Reactions  . Metoclopramide Other (See Comments)    Developed restless leg, akathisia type limb movements.   . Oxycodone-Acetaminophen Hives and Rash    FYI: no reaction to hydrocodone; tolerates dilaudid  . Codeine Itching, Rash and Other (See Comments)    (Derm)    Family History  Problem Relation Age of Onset  . Diabetes Mother   . Hypertension Other   . Stroke Other   . Arthritis Other   . Ovarian cancer Sister 67    maternal half-sister; RAD51C positive  . Breast cancer Maternal Aunt 18    currently 44  . Stomach cancer Maternal Uncle     dx 82s; deceased  . Cancer Paternal Aunt     unk. primary; deceased 79s  . Prostate cancer Paternal Uncle     deceased 64s  . Breast cancer Cousin     deceased 56s; daughter of mat uncle with stomach ca  . Prostate cancer Maternal Uncle     deceased 76s  . Breast cancer Maternal Aunt     deceased 45s  . Cancer Maternal Aunt     unk. primary  . Cancer Maternal Aunt     unk. primary  . Ovarian cancer Maternal Aunt     deceased 59s    BP 134/86 mmHg  Pulse 92  Temp(Src) 98.4 F (36.9 C) (Oral)  Wt 416 lb (188.696 kg)  SpO2 96%  LMP 10/14/2015  Review of Systems She denies hypoglycemia    Objective:   Physical  Exam VITAL SIGNS:  See vs page GENERAL: no distress. Morbid obesity Pulses: dorsalis pedis intact bilat.   MSK: no deformity of the feet CV: trace bilatno leg edema Skin:  no ulcer on the feet.  normal color and temp on the feet. Neuro: sensation is intact to touch on the feet  Lab Results  Component Value Date   HGBA1C 8.3 12/18/2015      Assessment & Plan:  DM: she needs increased rx.    Patient is advised the following: Patient Instructions  check your blood sugar 4 times a day: before the 3 meals, and at bedtime.  also check if you have symptoms of your blood sugar being too high or too low.  please keep a record of the readings and bring it to your next appointment here (or you can bring the meter itself).  You can write it on any piece of paper.  please call us sooner if your blood sugar goes below 70, or if you have a lot of readings over 200. Please come back for a follow-up appointment in 3 months.  Please increase the insulin to 3 times a day (just before each meal) 70-70-60 units

## 2016-01-05 ENCOUNTER — Emergency Department (HOSPITAL_COMMUNITY): Payer: Medicaid Other

## 2016-01-05 ENCOUNTER — Emergency Department (HOSPITAL_COMMUNITY)
Admission: EM | Admit: 2016-01-05 | Discharge: 2016-01-05 | Disposition: A | Discharge status: Home / Self Care | Payer: Medicaid Other | Attending: Emergency Medicine | Admitting: Emergency Medicine

## 2016-01-05 ENCOUNTER — Encounter (HOSPITAL_COMMUNITY): Payer: Self-pay

## 2016-01-05 DIAGNOSIS — Z9889 Other specified postprocedural states: Secondary | ICD-10-CM | POA: Insufficient documentation

## 2016-01-05 DIAGNOSIS — Z8739 Personal history of other diseases of the musculoskeletal system and connective tissue: Secondary | ICD-10-CM | POA: Insufficient documentation

## 2016-01-05 DIAGNOSIS — F419 Anxiety disorder, unspecified: Secondary | ICD-10-CM | POA: Diagnosis not present

## 2016-01-05 DIAGNOSIS — Z87448 Personal history of other diseases of urinary system: Secondary | ICD-10-CM | POA: Insufficient documentation

## 2016-01-05 DIAGNOSIS — K219 Gastro-esophageal reflux disease without esophagitis: Secondary | ICD-10-CM | POA: Insufficient documentation

## 2016-01-05 DIAGNOSIS — Z87891 Personal history of nicotine dependence: Secondary | ICD-10-CM | POA: Insufficient documentation

## 2016-01-05 DIAGNOSIS — G43909 Migraine, unspecified, not intractable, without status migrainosus: Secondary | ICD-10-CM | POA: Insufficient documentation

## 2016-01-05 DIAGNOSIS — Z9981 Dependence on supplemental oxygen: Secondary | ICD-10-CM | POA: Diagnosis not present

## 2016-01-05 DIAGNOSIS — I509 Heart failure, unspecified: Secondary | ICD-10-CM | POA: Insufficient documentation

## 2016-01-05 DIAGNOSIS — E669 Obesity, unspecified: Secondary | ICD-10-CM | POA: Insufficient documentation

## 2016-01-05 DIAGNOSIS — G4733 Obstructive sleep apnea (adult) (pediatric): Secondary | ICD-10-CM | POA: Diagnosis not present

## 2016-01-05 DIAGNOSIS — I10 Essential (primary) hypertension: Secondary | ICD-10-CM | POA: Diagnosis not present

## 2016-01-05 DIAGNOSIS — Z7982 Long term (current) use of aspirin: Secondary | ICD-10-CM | POA: Diagnosis not present

## 2016-01-05 DIAGNOSIS — Z79899 Other long term (current) drug therapy: Secondary | ICD-10-CM | POA: Diagnosis not present

## 2016-01-05 DIAGNOSIS — I209 Angina pectoris, unspecified: Secondary | ICD-10-CM | POA: Insufficient documentation

## 2016-01-05 DIAGNOSIS — E1143 Type 2 diabetes mellitus with diabetic autonomic (poly)neuropathy: Secondary | ICD-10-CM | POA: Insufficient documentation

## 2016-01-05 DIAGNOSIS — Z3202 Encounter for pregnancy test, result negative: Secondary | ICD-10-CM | POA: Diagnosis not present

## 2016-01-05 DIAGNOSIS — Z8673 Personal history of transient ischemic attack (TIA), and cerebral infarction without residual deficits: Secondary | ICD-10-CM | POA: Insufficient documentation

## 2016-01-05 DIAGNOSIS — R109 Unspecified abdominal pain: Secondary | ICD-10-CM | POA: Diagnosis present

## 2016-01-05 DIAGNOSIS — Z7901 Long term (current) use of anticoagulants: Secondary | ICD-10-CM | POA: Diagnosis not present

## 2016-01-05 DIAGNOSIS — Z7984 Long term (current) use of oral hypoglycemic drugs: Secondary | ICD-10-CM | POA: Diagnosis not present

## 2016-01-05 DIAGNOSIS — Z794 Long term (current) use of insulin: Secondary | ICD-10-CM | POA: Diagnosis not present

## 2016-01-05 DIAGNOSIS — Z8701 Personal history of pneumonia (recurrent): Secondary | ICD-10-CM | POA: Diagnosis not present

## 2016-01-05 LAB — URINALYSIS, ROUTINE W REFLEX MICROSCOPIC
Bilirubin Urine: NEGATIVE
GLUCOSE, UA: 100 mg/dL — AB
KETONES UR: 15 mg/dL — AB
Nitrite: NEGATIVE
PH: 5.5 (ref 5.0–8.0)
Protein, ur: 30 mg/dL — AB
Specific Gravity, Urine: 1.021 (ref 1.005–1.030)

## 2016-01-05 LAB — COMPREHENSIVE METABOLIC PANEL
ALBUMIN: 3 g/dL — AB (ref 3.5–5.0)
ALT: 10 U/L — AB (ref 14–54)
AST: 12 U/L — AB (ref 15–41)
Alkaline Phosphatase: 68 U/L (ref 38–126)
Anion gap: 8 (ref 5–15)
BUN: 6 mg/dL (ref 6–20)
CHLORIDE: 106 mmol/L (ref 101–111)
CO2: 24 mmol/L (ref 22–32)
CREATININE: 0.89 mg/dL (ref 0.44–1.00)
Calcium: 8.7 mg/dL — ABNORMAL LOW (ref 8.9–10.3)
GFR calc Af Amer: >60 mL/min (ref >60)
GFR calc non Af Amer: >60 mL/min (ref >60)
GLUCOSE: 191 mg/dL — AB (ref 65–99)
POTASSIUM: 3.8 mmol/L (ref 3.5–5.1)
SODIUM: 138 mmol/L (ref 135–145)
Total Bilirubin: 0.5 mg/dL (ref 0.3–1.2)
Total Protein: 7.1 g/dL (ref 6.5–8.1)

## 2016-01-05 LAB — URINE MICROSCOPIC-ADD ON

## 2016-01-05 LAB — CBC
HEMATOCRIT: 37 % (ref 36.0–46.0)
Hemoglobin: 11.3 g/dL — ABNORMAL LOW (ref 12.0–15.0)
MCH: 26.5 pg (ref 26.0–34.0)
MCHC: 30.5 g/dL (ref 30.0–36.0)
MCV: 86.7 fL (ref 78.0–100.0)
PLATELETS: 284 10*3/uL (ref 150–400)
RBC: 4.27 MIL/uL (ref 3.87–5.11)
RDW: 14.7 % (ref 11.5–15.5)
WBC: 10.3 10*3/uL (ref 4.0–10.5)

## 2016-01-05 LAB — CBG MONITORING, ED: GLUCOSE-CAPILLARY: 177 mg/dL — AB (ref 65–99)

## 2016-01-05 LAB — PROTIME-INR
INR: 1.05 (ref 0.00–1.49)
Prothrombin Time: 13.9 seconds (ref 11.6–15.2)

## 2016-01-05 LAB — PREGNANCY, URINE: Preg Test, Ur: NEGATIVE

## 2016-01-05 LAB — I-STAT TROPONIN, ED: TROPONIN I, POC: 0 ng/mL (ref 0.00–0.08)

## 2016-01-05 LAB — LIPASE, BLOOD: LIPASE: 22 U/L (ref 11–51)

## 2016-01-05 MED ORDER — IOPAMIDOL (ISOVUE-300) INJECTION 61%
INTRAVENOUS | Status: AC
  Administered 2016-01-05: 100 mL
  Filled 2016-01-05: qty 100

## 2016-01-05 MED ORDER — LORAZEPAM 0.5 MG PO TABS: 0.5000 mg | ORAL_TABLET | Freq: Three times a day (TID) | ORAL | Status: DC

## 2016-01-05 MED ORDER — LORAZEPAM 2 MG/ML IJ SOLN
1.0000 mg | Freq: Once | INTRAMUSCULAR | Status: AC
  Administered 2016-01-05: 1 mg via INTRAVENOUS
  Filled 2016-01-05: qty 1

## 2016-01-05 MED ORDER — ONDANSETRON 4 MG PO TBDP
4.0000 mg | ORAL_TABLET | Freq: Once | ORAL | Status: AC | PRN
  Administered 2016-01-05: 4 mg via ORAL

## 2016-01-05 MED ORDER — LORAZEPAM 2 MG/ML IJ SOLN
1.0000 mg | Freq: Once | INTRAMUSCULAR | Status: AC
Start: 2016-01-05 — End: 2016-01-05
  Administered 2016-01-05: 1 mg via INTRAVENOUS
  Filled 2016-01-05: qty 1

## 2016-01-05 MED ORDER — HYDROCODONE-ACETAMINOPHEN 5-325 MG PO TABS: 1.0000 | ORAL_TABLET | Freq: Four times a day (QID) | ORAL | Status: DC | PRN

## 2016-01-05 MED ORDER — ONDANSETRON 4 MG PO TBDP
ORAL_TABLET | ORAL | Status: AC
  Filled 2016-01-05: qty 1

## 2016-01-05 MED ORDER — PROMETHAZINE HCL 25 MG PO TABS: 25.0000 mg | ORAL_TABLET | Freq: Three times a day (TID) | ORAL | Status: DC | PRN

## 2016-01-05 MED ORDER — SODIUM CHLORIDE 0.9 % IV BOLUS (SEPSIS)
1000.0000 mL | Freq: Once | INTRAVENOUS | Status: AC
  Administered 2016-01-05: 1000 mL via INTRAVENOUS

## 2016-01-05 MED ORDER — HYDROMORPHONE HCL 1 MG/ML IJ SOLN
1.0000 mg | Freq: Once | INTRAMUSCULAR | Status: AC
  Administered 2016-01-05: 1 mg via INTRAVENOUS
  Filled 2016-01-05: qty 1

## 2016-01-05 NOTE — ED Provider Notes (Signed)
CSN: [id number]     Arrival date & time 01/05/16  1138 History   First MD Initiated Contact with Patient 01/05/16 1232     Chief Complaint  Patient presents with  . Abdominal Pain     (Consider location/radiation/quality/duration/timing/severity/associated sxs/prior Treatment) HPI Patient presents to the emergency department withAbdominal pain that started 3 days ago.  The patient states the pain is worse over the last 24 hours.  She states she did not take any medications at home.  She states she has had pain similar to this in the past.  Patient states that nothing seems make her condition better, but palpation makes the pain worseThe patient denies chest pain, shortness of breath, headache,blurred vision, neck pain, fever, cough, weakness, numbness, dizziness, anorexia, edema, abdominal pain, vomiting, diarrhea, rash, back pain, dysuria, hematemesis, bloody stool, near syncope, or syncope. Past Medical History  Diagnosis Date  . Bell's palsy 08/09/2010  . SJOGREN'S SYNDROME 08/09/2010  . Hypertension   . High cholesterol   . Anginal pain ([place])   . Obstructive sleep apnea on CPAP 2011  . Anxiety   . GERD (gastroesophageal reflux disease)   . CHF (congestive heart failure) ([place])   . Acute renal failure (HCC)      Intractable nausea vomiting secondary to diabetic gastroparesis causing dehydration and acute renal failure /notes 04/01/2013  . Liver mass 2014    biopsied 03/2013 at [facility], not malignant.  is to undergo radiologic ablation of the mass in sept/October 2014.   . On home oxygen therapy     "2L 24/7" (02/28/2015)  . Migraines     "maybe a couple times/yr" (04/01/2013)  . Stroke [facility]) 2010; 10/2012    "left side is still weak from it, never fully regained full strength; no additions from stroke 10/2012"  . Obesity   . Family history of malignant neoplasm of breast   . Family history of malignant neoplasm of ovary   . Chest pain 01/13/2014  . Diabetic gastroparesis associated  with type 2 diabetes mellitus ([place])     this is presumed diagnoses, not confirmed by any studies.   Marland Kitchen SLE (systemic lupus erythematosus) ([place])     Archie Endo 12/01/2010  . CAP (community acquired pneumonia) 10/2011    Archie Endo 11/08/2011   Past Surgical History  Procedure Laterality Date  . Ectopic pregnancy surgery  1999  . Cardiac catheterization  02/07/12  . Hernia repair    . Esophagogastroduodenoscopy N/A 02/26/2013    Procedure: ESOPHAGOGASTRODUODENOSCOPY (EGD);  Surgeon: Irene Shipper, MD;  Location: [facility] ENDOSCOPY;  Service: Endoscopy;  Laterality: N/A;  . Breast cyst excision Left 08/2005    epidermoid  . Left and right heart catheterization with coronary angiogram N/A 02/07/2012    Procedure: LEFT AND RIGHT HEART CATHETERIZATION WITH CORONARY ANGIOGRAM;  Surgeon: Laverda Page, MD;  Location: [facility] CATH LAB;  Service: Cardiovascular;  Laterality: N/A;  . Umbilical hernia repair  1980's  . Muscle biopsy      for lupus/notes 12/01/2010  . Liver biopsy  03/2013    liver mass/medical hx noted above  . Esophagogastroduodenoscopy N/A 03/02/2015    Procedure: ESOPHAGOGASTRODUODENOSCOPY (EGD);  Surgeon: Milus Banister, MD;  Location: [place];  Service: Endoscopy;  Laterality: N/A;   Family History  Problem Relation Age of Onset  . Diabetes Mother   . Hypertension Other   . Stroke Other   . Arthritis Other   . Ovarian cancer Sister 38    maternal half-sister; RAD51C positive  .  Breast cancer Maternal Aunt 47    currently 71  . Stomach cancer Maternal Uncle     dx 36s; deceased  . Cancer Paternal Aunt     unk. primary; deceased 4s  . Prostate cancer Paternal Uncle     deceased 57s  . Breast cancer Cousin     deceased 78s; daughter of mat uncle with stomach ca  . Prostate cancer Maternal Uncle     deceased 65s  . Breast cancer Maternal Aunt     deceased 29s  . Cancer Maternal Aunt     unk. primary  . Cancer Maternal Aunt     unk. primary  . Ovarian cancer Maternal Aunt     deceased  70s   Social History  Substance Use Topics  . Smoking status: Former Smoker -- 3 years    Types: Cigarettes    Quit date: 06/01/1990  . Smokeless tobacco: Never Used  . Alcohol Use: No   OB History    Gravida Para Term Preterm AB TAB SAB Ectopic Multiple Living   2 1 1  1   1  1      Review of Systems  All other systems negative except as documented in the HPI. All pertinent positives and negatives as reviewed in the HPI.  Allergies  Metoclopramide; Oxycodone-acetaminophen; and Codeine  Home Medications   Prior to Admission medications   Medication Sig Start Date End Date Taking? Authorizing Provider  albuterol (PROAIR HFA) 108 (90 BASE) MCG/ACT inhaler Inhale 2 puffs into the lungs every 6 (six) hours as needed for wheezing or shortness of breath.    Yes Historical Provider, MD  albuterol (PROVENTIL) (5 MG/ML) 0.5% nebulizer solution Take 2.5 mg by nebulization every 6 (six) hours as needed for wheezing or shortness of breath.  06/24/14  Yes Historical Provider, MD  alendronate (FOSAMAX) 70 MG tablet Take 70 mg by mouth every Saturday. Take with a full glass of water on an empty stomach.   Yes Historical Provider, MD  azaTHIOprine (IMURAN) 50 MG tablet Take 100 mg by mouth 2 (two) times daily.    Yes Historical Provider, MD  carvedilol (COREG) 6.25 MG tablet Take 6.25 mg by mouth 2 (two) times daily with a meal.   Yes Historical Provider, MD  cetirizine (ZYRTEC) 10 MG tablet Take 10 mg by mouth daily.   Yes Historical Provider, MD  clopidogrel (PLAVIX) 75 MG tablet Take 75 mg by mouth daily.   Yes Historical Provider, MD  clotrimazole-betamethasone (LOTRISONE) cream Apply 1 application topically daily. To feet   Yes Historical Provider, MD  cycloSPORINE (RESTASIS) 0.05 % ophthalmic emulsion Place 1 drop into both eyes 3 (three) times daily.   Yes Historical Provider, MD  famotidine (PEPCID) 20 MG tablet Take 20 mg by mouth 2 (two) times daily.   Yes Historical Provider, MD   fluorometholone (FML) 0.1 % ophthalmic suspension Place 1 drop into both eyes 2 (two) times daily. 02/11/15  Yes Historical Provider, MD  hydrochlorothiazide (HYDRODIURIL) 25 MG tablet Take 25 mg by mouth daily.   Yes Historical Provider, MD  hydroxychloroquine (PLAQUENIL) 200 MG tablet Take 200 mg by mouth 2 (two) times daily.    Yes Historical Provider, MD  insulin regular human CONCENTRATED (HUMULIN R) 500 UNIT/ML injection 3 times a day (just before each meal) 70-70-60 units 12/18/15  Yes Renato Shin, MD  megestrol (MEGACE) 40 MG tablet Take 40 mg by mouth daily. 01/30/15  Yes Historical Provider, MD  metFORMIN (GLUCOPHAGE-XR) 500 MG  24 hr tablet Take 1,000 mg by mouth 2 (two) times daily.   Yes Historical Provider, MD  morphine (MSIR) 15 MG tablet Take 30 mg by mouth every evening.    Yes Historical Provider, MD  nitroGLYCERIN (NITROSTAT) 0.4 MG SL tablet Place 0.4 mg under the tongue every 5 (five) minutes x 3 doses as needed for chest pain.  02/26/15 02/26/16 Yes Historical Provider, MD  NUVARING 0.12-0.015 MG/24HR vaginal ring Place 1 each vaginally every 21 ( twenty-one) days.  01/28/15  Yes Historical Provider, MD  PAZEO 0.7 % SOLN Place 1 drop into both eyes every morning. 02/11/15  Yes Historical Provider, MD  Polyvinyl Alcohol-Povidone (REFRESH OP) Place 1 drop into both eyes 2 (two) times daily.   Yes Historical Provider, MD  potassium chloride SA (K-DUR,KLOR-CON) 20 MEQ tablet Take 20 mEq by mouth 2 (two) times daily.   Yes Historical Provider, MD  predniSONE (DELTASONE) 5 MG tablet Take 5 mg by mouth daily. 02/25/15  Yes Historical Provider, MD  pregabalin (LYRICA) 75 MG capsule Take 75 mg by mouth 2 (two) times daily. 01/28/15  Yes Historical Provider, MD  torsemide (DEMADEX) 20 MG tablet Take 1 tablet (20 mg total) by mouth 2 (two) times daily. 04/06/13  Yes Belkys A Regalado, MD  aspirin EC 81 MG tablet Take 81 mg by mouth daily.    Historical Provider, MD  BD INSULIN SYRINGE U-500 31G X  6MM 0.5 ML MISC USE TO INJECT INSULIN AS DIRECTED 07/08/15   Renato Shin, MD  glucose blood (ACCU-CHEK AVIVA) test strip 1 each by Other route 4 (four) times daily. And lancets 4/day 09/18/15   Renato Shin, MD  LORazepam (ATIVAN) 1 MG tablet Take 1 tablet (1 mg total) by mouth every 6 (six) hours as needed for anxiety. Patient not taking: Reported on 11/23/2015 11/22/14   Montine Circle, PA-C   BP 153/95 mmHg  Pulse 80  Temp(Src) 99.1 F (37.3 C) (Oral)  Resp 20  SpO2 98%  LMP 12/07/2015 Physical Exam  Constitutional: She is oriented to person, place, and time. She appears well-developed and well-nourished. No distress.  HENT:  Head: Normocephalic and atraumatic.  Mouth/Throat: Oropharynx is clear and moist.  Eyes: Pupils are equal, round, and reactive to light.  Neck: Normal range of motion. Neck supple.  Cardiovascular: Normal rate, regular rhythm and normal heart sounds.  Exam reveals no gallop and no friction rub.   No murmur heard. Pulmonary/Chest: Effort normal and breath sounds normal. No respiratory distress. She has no wheezes.  Abdominal: Soft. Bowel sounds are normal. She exhibits no distension. There is tenderness. There is no rebound and no guarding.  Neurological: She is alert and oriented to person, place, and time. She exhibits normal muscle tone. Coordination normal.  Skin: Skin is warm and dry. No rash noted. No erythema.  Psychiatric: She has a normal mood and affect. Her behavior is normal.  Nursing note and vitals reviewed.   ED Course  Procedures (including critical care time) Labs Review Labs Reviewed  COMPREHENSIVE METABOLIC PANEL - Abnormal; Notable for the following:    Glucose, Bld 191 (*)    Calcium 8.7 (*)    Albumin 3.0 (*)    AST 12 (*)    ALT 10 (*)    All other components within normal limits  CBC - Abnormal; Notable for the following:    Hemoglobin 11.3 (*)    All other components within normal limits  URINALYSIS, ROUTINE W REFLEX  MICROSCOPIC (NOT AT  [facility]) - Abnormal; Notable for the following:    APPearance CLOUDY (*)    Glucose, UA 100 (*)    Hgb urine dipstick LARGE (*)    Ketones, ur 15 (*)    Protein, ur 30 (*)    Leukocytes, UA SMALL (*)    All other components within normal limits  URINE MICROSCOPIC-ADD ON - Abnormal; Notable for the following:    Squamous Epithelial / LPF 6-30 (*)    Bacteria, UA FEW (*)    All other components within normal limits  CBG MONITORING, ED - Abnormal; Notable for the following:    Glucose-Capillary 177 (*)    All other components within normal limits  URINE CULTURE  LIPASE, BLOOD  PROTIME-INR  PREGNANCY, URINE  POC URINE PREG, ED    Imaging Review Ct Abdomen Pelvis W Contrast  01/05/2016  CLINICAL DATA:  45 year old female with abdominal and pelvic pain with nausea and diarrhea for 1 week. EXAM: CT ABDOMEN AND PELVIS WITH CONTRAST TECHNIQUE: Multidetector CT imaging of the abdomen and pelvis was performed using the standard protocol following bolus administration of intravenous contrast. CONTRAST:  13m ISOVUE-300 IOPAMIDOL (ISOVUE-300) INJECTION 61% COMPARISON:  04/27/2014 FINDINGS: Lower chest:  Cardiomegaly noted without acute abnormality. Hepatobiliary: The liver and gallbladder are unremarkable. There is no evidence of biliary dilatation. Pancreas: Unremarkable Spleen: Unremarkable Adrenals/Urinary Tract: The kidneys, adrenal glands and bladder are unremarkable. Stomach/Bowel: Unremarkable. There is no evidence of bowel obstruction or definite bowel wall thickening. The appendix is normal. Vascular/Lymphatic: Unremarkable. No abdominal aortic aneurysm identified. Reproductive: Unremarkable Other: No free fluid, focal collection or pneumoperitoneum. Musculoskeletal: No acute or suspicious abnormalities. IMPRESSION: No evidence of acute or significant abdominal/pelvic abnormality. Cardiomegaly. Electronically Signed   By: JMargarette CanadaM.D.   On: 01/05/2016 15:25   I have  personally reviewed and evaluated these images and lab results as part of my medical decision-making.   EKG Interpretation   Date/Time:  Tuesday January 05 2016 11:57:43 EDT Ventricular Rate:  95 PR Interval:  140 QRS Duration: 92 QT Interval:  358 QTC Calculation: 449 R Axis:   79 Text Interpretation:  Normal sinus rhythm Cannot rule out Anterior infarct  , age undetermined ST \T\ T wave abnormality, consider inferior ischemia  Abnormal ECG No significant change was found Confirmed by RWyvonnia Dusky MD,  STEPHEN ([phone] on 01/05/2016 12:33:23 PM      MDM   Final diagnoses:  None     Patient is advised to follow up with her primary care Dr. told to return here as needed.  She is feeling better following IV fluids.  Patient agrees the plan and all questions were answered.  Patient most likely is having continuation of her chronic abdominal pain  CDalia Heading PA-C 01/08/16 1[place] MD 01/08/16 1525

## 2016-01-05 NOTE — ED Notes (Signed)
Pt. Having n/v for 7 days with intermittent loose stool.  She also reports that she has been dizzy and lightheaded.  She also has Lupus and her joints are hurting.  Skin is warm and dry.  ECG completed in Triage .  Pt. Taking  Zofran at home.  Pt. Also reports having sob and has chest pain only when she vomits.  Alert and orinted X4

## 2016-01-05 NOTE — ED Notes (Signed)
CBG 177. 

## 2016-01-05 NOTE — Discharge Instructions (Signed)
Return here as needed.  Follow-up with her primary care doctor.  Your scan did not show any significant abnormality.  Lab testing was stable

## 2016-01-06 LAB — URINE CULTURE

## 2016-01-13 ENCOUNTER — Telehealth: Payer: Self-pay | Admitting: Endocrinology

## 2016-01-13 MED ORDER — ACCU-CHEK AVIVA PLUS W/DEVICE KIT: PACK | Status: DC

## 2016-01-13 NOTE — Telephone Encounter (Signed)
Rx submitted

## 2016-01-13 NOTE — Telephone Encounter (Signed)
Patient need a new meter glucose blood (ACCU-CHEK AVIVA), dies on her, she would like you to send to  [place], [place], [place] [id number] (Phone) [phone] (Fax)

## 2016-01-14 ENCOUNTER — Other Ambulatory Visit: Payer: Self-pay | Admitting: Endocrinology

## 2016-01-18 ENCOUNTER — Encounter: Payer: Self-pay | Admitting: Physical Therapy

## 2016-01-18 ENCOUNTER — Ambulatory Visit: Payer: Medicaid Other | Attending: Anesthesiology | Admitting: Physical Therapy

## 2016-01-18 DIAGNOSIS — M545 Low back pain: Secondary | ICD-10-CM

## 2016-01-18 DIAGNOSIS — R262 Difficulty in walking, not elsewhere classified: Secondary | ICD-10-CM | POA: Diagnosis present

## 2016-01-18 NOTE — Therapy (Signed)
[place], [place], [address] Phone: [phone]   Fax:  [phone]  Physical Therapy Evaluation  Patient Details  Name: [name] MRN: [id number] Date of Birth: 01-Mar-1971 Referring Provider: Shanon Ace, M.D.  Encounter Date: 01/18/2016      PT End of Session - 01/18/16 1638    Visit Number 1   Authorization Type medicaid-one time visit   PT Start Time 1634   PT Stop Time [id number]   PT Time Calculation (min) 39 min   Activity Tolerance Patient tolerated treatment well   Behavior During Therapy WFL for tasks assessed/performed      Past Medical History  Diagnosis Date  . Bell's palsy 08/09/2010  . SJOGREN'S SYNDROME 08/09/2010  . Hypertension   . High cholesterol   . Anginal pain ([place])   . Obstructive sleep apnea on CPAP 2011  . Anxiety   . GERD (gastroesophageal reflux disease)   . CHF (congestive heart failure) ([place])   . Acute renal failure (HCC)      Intractable nausea vomiting secondary to diabetic gastroparesis causing dehydration and acute renal failure /notes 04/01/2013  . Liver mass 2014    biopsied 03/2013 at [facility], not malignant.  is to undergo radiologic ablation of the mass in sept/October 2014.   . On home oxygen therapy     "2L 24/7" (02/28/2015)  . Migraines     "maybe a couple times/yr" (04/01/2013)  . Stroke [facility]) 2010; 10/2012    "left side is still weak from it, never fully regained full strength; no additions from stroke 10/2012"  . Obesity   . Family history of malignant neoplasm of breast   . Family history of malignant neoplasm of ovary   . Chest pain 01/13/2014  . Diabetic gastroparesis associated with type 2 diabetes mellitus ([place])     this is presumed diagnoses, not confirmed by any studies.   Marland Kitchen SLE (systemic lupus erythematosus) ([place])     Archie Endo 12/01/2010  . CAP (community acquired pneumonia) 10/2011    Archie Endo 11/08/2011    Past Surgical History   Procedure Laterality Date  . Ectopic pregnancy surgery  1999  . Cardiac catheterization  02/07/12  . Hernia repair    . Esophagogastroduodenoscopy N/A 02/26/2013    Procedure: ESOPHAGOGASTRODUODENOSCOPY (EGD);  Surgeon: Irene Shipper, MD;  Location: [facility] ENDOSCOPY;  Service: Endoscopy;  Laterality: N/A;  . Breast cyst excision Left 08/2005    epidermoid  . Left and right heart catheterization with coronary angiogram N/A 02/07/2012    Procedure: LEFT AND RIGHT HEART CATHETERIZATION WITH CORONARY ANGIOGRAM;  Surgeon: Laverda Page, MD;  Location: [facility] CATH LAB;  Service: Cardiovascular;  Laterality: N/A;  . Umbilical hernia repair  1980's  . Muscle biopsy      for lupus/notes 12/01/2010  . Liver biopsy  03/2013    liver mass/medical hx noted above  . Esophagogastroduodenoscopy N/A 03/02/2015    Procedure: ESOPHAGOGASTRODUODENOSCOPY (EGD);  Surgeon: Milus Banister, MD;  Location: [facility];  Service: Endoscopy;  Laterality: N/A;    There were no vitals filed for this visit.       Subjective Assessment - 01/18/16 1639    Subjective Believes that the pain may be result of weight, feels better when losing weight. Lupus flare ups and steroid increase results in weight gain and increased pain. Pinching- when supine needs a pillow under her back. Bending down to grab objects from floor. Denies radicular pain.    Currently  in Pain? Yes   Pain Score 8    Pain Location Back   Pain Orientation Mid   Pain Descriptors / Indicators Aching   Aggravating Factors  sitting, laying, standing 5 min or more   Pain Relieving Factors topical anesthetics, medications            [facility] PT Assessment - 01/18/16 0001    Assessment   Medical Diagnosis lumbago   Referring Provider Shanon Ace, M.D.   Next MD Visit unknown   Prior Therapy no   Precautions   Precautions None   Restrictions   Weight Bearing Restrictions No   Balance Screen   Has the patient fallen in the past 6 months No   [place] residence   Prior Function   Level of Independence Independent   Cognition   Overall Cognitive Status Within Functional Limits for tasks assessed   Posture/Postural Control   Posture Comments tends to stand with weight shifted into RLE; increased lumbar lordosis and anterior pelvic tilt   ROM / Strength   AROM / PROM / Strength --  overall poor demonstration of strength due to pain                   [facility] Adult PT Treatment/Exercise - 01/18/16 0001    Exercises   Exercises Lumbar   Lumbar Exercises: Stretches   Lower Trunk Rotation Limitations x20 ea   Lumbar Exercises: Supine   Ab Set 20 reps;5 seconds   Other Supine Lumbar Exercises hooklying iso add; resisted abd                PT Education - 01/18/16 1720    Education provided Yes   Education Details anatomy of condition, POC, HEP, [facility] clinic   Person(s) Educated Patient   Methods Explanation;Demonstration;Tactile cues;Verbal cues;Handout   Comprehension Verbalized understanding;Returned demonstration;Verbal cues required;Tactile cues required                    Plan - 01/18/16 1721    Clinical Impression Statement Pt presents to PT with complaints of LBP that increase with flucutations in weight. Pt was instructed in stabilization exercises for lumbo pelvic region. Discussion held to educate in benefits of water walkking and progressing into water aerobics. Pt was able to demonstrate exercises and verbalized comfort in continuing. Provided with information on [facility] pro bono PT clinic to continue care.    PT Treatment/Interventions ADLs/Self Care Home Management;Patient/family education;Therapeutic exercise   PT Home Exercise Plan see pt instructions   Consulted and Agree with Plan of Care Patient      Patient will benefit from skilled therapeutic intervention in order to improve the following deficits and impairments:  Pain, Improper body mechanics,  Postural dysfunction, Decreased mobility, Decreased activity tolerance, Decreased endurance, Decreased strength, Difficulty walking  Visit Diagnosis: Midline low back pain, with sciatica presence unspecified - Plan: PT plan of care cert/re-cert  Difficulty in walking, not elsewhere classified - Plan: PT plan of care cert/re-cert     Problem List Patient Active Problem List   Diagnosis Date Noted  . Diabetes ([place]) 09/19/2015  . Headache, migraine   . Chronic tension-type headache, intractable   . Epigastric pain   . History of lupus   . History of Sjogren's disease   . Nausea with vomiting   . Intractable pain 02/27/2015  . Lupus ([place]) 01/25/2015  . Headache 01/25/2015  . Neck pain 07/28/2014  . Radiculopathy of  cervical spine   . QT prolongation 03/06/2014  . Severe headache 03/05/2014  . SLE exacerbation ([place]) 01/13/2014  . Family history of malignant neoplasm of breast   . Family history of malignant neoplasm of ovary   . Intractable nausea and vomiting 11/11/2013  . Septate uterus 09/08/2013  . Sebaceous cyst 05/22/2013  . Diabetic gastroparesis ([place]) 04/28/2013  . Constipation due to pain medication 04/21/2013  . Odynophagia 04/04/2013  . Liver mass 02/28/2013  . Reflux esophagitis 02/26/2013  . Morbid obesity ([place]) 10/10/2012  . CVA (cerebral infarction) 10/09/2012  . Chronic respiratory failure with hypoxia ([place]) 10/09/2012  . Clinical polymyositis syndrome ([place]) 09/06/2012  . Mononeuritis of lower limb 01/12/2012  . Shortness of breath 11/08/2011  . Chest pain 11/08/2011  . Leukocytosis 11/08/2011  . Anemia 11/08/2011  . HTN (hypertension) 11/08/2011  . Systolic CHF, acute on chronic ([place]) 11/08/2011  . HLD (hyperlipidemia) 07/19/2011  . Bell's palsy 08/09/2010  . SLE 08/09/2010  . SJOGREN'S SYNDROME 08/09/2010   Demarr Kluever C. Jalilah Wiltsie PT, DPT 01/18/2016 5:27 PM   [facility] [address] [place], [place], [address] Phone: [phone]   Fax:  [phone]  Name: [name] MRN: [id number] Date of Birth: [birth year]

## 2016-03-15 ENCOUNTER — Encounter: Payer: Self-pay | Admitting: Family Medicine

## 2016-03-18 ENCOUNTER — Telehealth: Payer: Self-pay | Admitting: Endocrinology

## 2016-03-18 ENCOUNTER — Encounter: Payer: Self-pay | Admitting: Endocrinology

## 2016-03-18 ENCOUNTER — Ambulatory Visit (INDEPENDENT_AMBULATORY_CARE_PROVIDER_SITE_OTHER): Payer: Medicaid Other | Admitting: Endocrinology

## 2016-03-18 VITALS — BP 142/94 | HR 92 | Ht 68.0 in | Wt >= 6400 oz

## 2016-03-18 DIAGNOSIS — L03031 Cellulitis of right toe: Secondary | ICD-10-CM | POA: Diagnosis not present

## 2016-03-18 DIAGNOSIS — E119 Type 2 diabetes mellitus without complications: Secondary | ICD-10-CM

## 2016-03-18 DIAGNOSIS — Z794 Long term (current) use of insulin: Secondary | ICD-10-CM | POA: Diagnosis not present

## 2016-03-18 DIAGNOSIS — E1143 Type 2 diabetes mellitus with diabetic autonomic (poly)neuropathy: Secondary | ICD-10-CM

## 2016-03-18 LAB — POCT GLYCOSYLATED HEMOGLOBIN (HGB A1C): Hemoglobin A1C: 8.4

## 2016-03-18 MED ORDER — DOXYCYCLINE HYCLATE 100 MG PO TABS: 100.0000 mg | ORAL_TABLET | Freq: Two times a day (BID) | ORAL | 0 refills | Status: DC

## 2016-03-18 MED ORDER — INSULIN REGULAR HUMAN (CONC) 500 UNIT/ML ~~LOC~~ SOLN: SUBCUTANEOUS | 11 refills | Status: DC

## 2016-03-18 NOTE — Progress Notes (Signed)
Subjective:    Patient ID: [name], female    DOB: 02-09-1971, 45 y.o.   MRN: [id number]  HPI Pt returns for f/u of diabetes mellitus: DM type: Insulin-requiring type 2 Dx'ed: 4270 Complications: polyneuropathy and gastroparesis.  Therapy: insulin (since 2014), and metformin.   GDM: never DKA: never Severe hypoglycemia: never Pancreatitis: never Other: she is too ill to undergo weight-loss surgery; she takes multiple daily injections of U-500; in early 2015, she was hospitalized with n/v, due to gastroparesis; pt says she is not at risk for pregnancy; she has U-500 syringes.  Interval history: prednisone is still at 5 mg daily.  no cbg record, but states cbg's vary from 87-247.  She says it is highest in the afternoon, and lowest fasting.  She says she never misses the insulin.   Past Medical History:  Diagnosis Date  . Acute renal failure (HCC)     Intractable nausea vomiting secondary to diabetic gastroparesis causing dehydration and acute renal failure /notes 04/01/2013  . Anginal pain ([place])   . Anxiety   . Bell's palsy 08/09/2010  . CAP (community acquired pneumonia) 10/2011   Archie Endo 11/08/2011  . Chest pain 01/13/2014  . CHF (congestive heart failure) ([place])   . Diabetic gastroparesis associated with type 2 diabetes mellitus ([place])    this is presumed diagnoses, not confirmed by any studies.   . Family history of malignant neoplasm of breast   . Family history of malignant neoplasm of ovary   . GERD (gastroesophageal reflux disease)   . High cholesterol   . Hypertension   . Liver mass 2014   biopsied 03/2013 at [facility], not malignant.  is to undergo radiologic ablation of the mass in sept/October 2014.   . Migraines    "maybe a couple times/yr" (04/01/2013)  . Obesity   . Obstructive sleep apnea on CPAP 2011  . On home oxygen therapy    "2L 24/7" (02/28/2015)  . SJOGREN'S SYNDROME 08/09/2010  . SLE (systemic lupus erythematosus) ([place])    Archie Endo 12/01/2010  . Stroke  [facility]) 2010; 10/2012   "left side is still weak from it, never fully regained full strength; no additions from stroke 10/2012"    Past Surgical History:  Procedure Laterality Date  . BREAST CYST EXCISION Left 08/2005   epidermoid  . CARDIAC CATHETERIZATION  02/07/12  . ECTOPIC PREGNANCY SURGERY  1999  . ESOPHAGOGASTRODUODENOSCOPY N/A 02/26/2013   Procedure: ESOPHAGOGASTRODUODENOSCOPY (EGD);  Surgeon: Irene Shipper, MD;  Location: [facility] ENDOSCOPY;  Service: Endoscopy;  Laterality: N/A;  . ESOPHAGOGASTRODUODENOSCOPY N/A 03/02/2015   Procedure: ESOPHAGOGASTRODUODENOSCOPY (EGD);  Surgeon: Milus Banister, MD;  Location: [place];  Service: Endoscopy;  Laterality: N/A;  . HERNIA REPAIR    . LEFT AND RIGHT HEART CATHETERIZATION WITH CORONARY ANGIOGRAM N/A 02/07/2012   Procedure: LEFT AND RIGHT HEART CATHETERIZATION WITH CORONARY ANGIOGRAM;  Surgeon: Laverda Page, MD;  Location: [facility] CATH LAB;  Service: Cardiovascular;  Laterality: N/A;  . LIVER BIOPSY  03/2013   liver mass/medical hx noted above  . MUSCLE BIOPSY     for lupus/notes 12/01/2010  . UMBILICAL HERNIA REPAIR  1980's    Social History   Social History  . Marital status: Single    Spouse name: N/A  . Number of children: N/A  . Years of education: N/A   Occupational History  .  Unemployed    student   Social History Main Topics  . Smoking status: Former Smoker    Years: 3.00  Types: Cigarettes    Quit date: 06/01/1990  . Smokeless tobacco: Never Used  . Alcohol use No  . Drug use: No  . Sexual activity: Yes    Partners: Male    Birth control/ protection: None   Other Topics Concern  . Not on file   Social History Narrative   Regular exercise-yes    Current Outpatient Prescriptions on File Prior to Visit  Medication Sig Dispense Refill  . albuterol (PROAIR HFA) 108 (90 BASE) MCG/ACT inhaler Inhale 2 puffs into the lungs every 6 (six) hours as needed for wheezing or shortness of breath.     Marland Kitchen albuterol (PROVENTIL) (5  MG/ML) 0.5% nebulizer solution Take 2.5 mg by nebulization every 6 (six) hours as needed for wheezing or shortness of breath.   5  . alendronate (FOSAMAX) 70 MG tablet Take 70 mg by mouth every Saturday. Take with a full glass of water on an empty stomach.    Marland Kitchen aspirin EC 81 MG tablet Take 81 mg by mouth daily.    Marland Kitchen azaTHIOprine (IMURAN) 50 MG tablet Take 100 mg by mouth 2 (two) times daily.     . BD INSULIN SYRINGE U-500 31G X 6MM 0.5 ML MISC USE TO INJECT INSULIN AS DIRECTED 100 each 1  . Blood Glucose Monitoring Suppl (ACCU-CHEK AVIVA PLUS) w/Device KIT Use to check blood sugar 4 times per day. 1 kit 2  . carvedilol (COREG) 6.25 MG tablet Take 6.25 mg by mouth 2 (two) times daily with a meal.    . cetirizine (ZYRTEC) 10 MG tablet Take 10 mg by mouth daily.    . clopidogrel (PLAVIX) 75 MG tablet Take 75 mg by mouth daily.    . clotrimazole-betamethasone (LOTRISONE) cream Apply 1 application topically daily. To feet    . cycloSPORINE (RESTASIS) 0.05 % ophthalmic emulsion Place 1 drop into both eyes 3 (three) times daily.    . famotidine (PEPCID) 20 MG tablet Take 20 mg by mouth 2 (two) times daily.    . fluorometholone (FML) 0.1 % ophthalmic suspension Place 1 drop into both eyes 2 (two) times daily.  0  . glucose blood (ACCU-CHEK AVIVA) test strip 1 each by Other route 4 (four) times daily. And lancets 4/day 120 each 12  . hydrochlorothiazide (HYDRODIURIL) 25 MG tablet Take 25 mg by mouth daily.    Marland Kitchen HYDROcodone-acetaminophen (NORCO/VICODIN) 5-325 MG tablet Take 1 tablet by mouth every 6 (six) hours as needed for moderate pain. 15 tablet 0  . hydroxychloroquine (PLAQUENIL) 200 MG tablet Take 200 mg by mouth 2 (two) times daily.     Marland Kitchen LORazepam (ATIVAN) 0.5 MG tablet Take 1 tablet (0.5 mg total) by mouth every 8 (eight) hours. 10 tablet 0  . megestrol (MEGACE) 40 MG tablet Take 40 mg by mouth daily.  3  . metFORMIN (GLUCOPHAGE-XR) 500 MG 24 hr tablet Take 1,000 mg by mouth 2 (two) times daily.      Marland Kitchen morphine (MSIR) 15 MG tablet Take 30 mg by mouth every evening.     Marland Kitchen NUVARING 0.12-0.015 MG/24HR vaginal ring Place 1 each vaginally every 21 ( twenty-one) days.   12  . PAZEO 0.7 % SOLN Place 1 drop into both eyes every morning.  3  . Polyvinyl Alcohol-Povidone (REFRESH OP) Place 1 drop into both eyes 2 (two) times daily.    . potassium chloride SA (K-DUR,KLOR-CON) 20 MEQ tablet Take 20 mEq by mouth 2 (two) times daily.    . predniSONE (DELTASONE) 5  MG tablet Take 5 mg by mouth daily.    . pregabalin (LYRICA) 75 MG capsule Take 75 mg by mouth 2 (two) times daily.    Marland Kitchen torsemide (DEMADEX) 20 MG tablet Take 1 tablet (20 mg total) by mouth 2 (two) times daily. 30 tablet 0  . [DISCONTINUED] promethazine (PHENERGAN) 25 MG tablet Take 1 tablet (25 mg total) by mouth every 8 (eight) hours as needed for nausea or vomiting. 15 tablet 0   No current facility-administered medications on file prior to visit.     Allergies  Allergen Reactions  . Metoclopramide Other (See Comments)    Developed restless leg, akathisia type limb movements.   . Oxycodone-Acetaminophen Hives and Rash    FYI: no reaction to hydrocodone; tolerates dilaudid  . Codeine Itching, Rash and Other (See Comments)    (Derm)    Family History  Problem Relation Age of Onset  . Diabetes Mother   . Hypertension Other   . Stroke Other   . Arthritis Other   . Ovarian cancer Sister 37    maternal half-sister; RAD51C positive  . Breast cancer Maternal Aunt 68    currently 64  . Stomach cancer Maternal Uncle     dx 43s; deceased  . Cancer Paternal Aunt     unk. primary; deceased 44s  . Prostate cancer Paternal Uncle     deceased 77s  . Breast cancer Cousin     deceased 52s; daughter of mat uncle with stomach ca  . Prostate cancer Maternal Uncle     deceased 10s  . Breast cancer Maternal Aunt     deceased 72s  . Cancer Maternal Aunt     unk. primary  . Cancer Maternal Aunt     unk. primary  . Ovarian cancer Maternal  Aunt     deceased 70s    BP (!) 142/94   Pulse 92   Ht 5' 8"  (1.727 m)   Wt (!) 402 lb (182.3 kg)   SpO2 97%   BMI 61.12 kg/m   Review of Systems She denies hypoglycemia and fever.     Objective:   Physical Exam VITAL SIGNS:  See vs page GENERAL: no distress.  Morbid obesity.  Pulses: dorsalis pedis intact bilat.   MSK: no deformity of the feet CV: no leg edema Skin:  no ulcer on the feet.  normal color and temp on the feet. Neuro: sensation is intact to touch on the feet Ext: at the medial aspect of the right great toenail, there is slight swelling and drainage trace  Lab Results  Component Value Date   HGBA1C 8.4 03/18/2016      Assessment & Plan:  [place], new Insulin-requiring type 2 DM: she needs increased rx.

## 2016-03-18 NOTE — Telephone Encounter (Signed)
Patient need Medication for the antibiotics [id number] [address], [place]. [phone] (Phone) [phone] (Fax)   Patient is waiting at the pharmacy.

## 2016-03-18 NOTE — Telephone Encounter (Signed)
Rx for doxycycline resubmitted to [facility] and pt notified.

## 2016-03-18 NOTE — Patient Instructions (Addendum)
check your blood sugar 4 times a day: before the 3 meals, and at bedtime.  also check if you have symptoms of your blood sugar being too high or too low.  please keep a record of the readings and bring it to your next appointment here (or you can bring the meter itself).  You can write it on any piece of paper.  please call us sooner if your blood sugar goes below 70, or if you have a lot of readings over 200. Please come back for a follow-up appointment in 2 months.  I have sent a prescription to your pharmacy, for an antibiotic pill.  I hope your toe is better soon.  If you don't feel better by next week, please call back.  Please call sooner if you get worse. Please increase the insulin to 3 times a day (just before each meal) 70-90-70 units.

## 2016-04-22 ENCOUNTER — Emergency Department (HOSPITAL_COMMUNITY): Payer: Medicaid Other

## 2016-04-22 ENCOUNTER — Emergency Department (HOSPITAL_COMMUNITY)
Admission: EM | Admit: 2016-04-22 | Discharge: 2016-04-22 | Disposition: A | Discharge status: Home / Self Care | Payer: Medicaid Other | Attending: Emergency Medicine | Admitting: Emergency Medicine

## 2016-04-22 ENCOUNTER — Encounter (HOSPITAL_COMMUNITY): Payer: Self-pay

## 2016-04-22 DIAGNOSIS — E1143 Type 2 diabetes mellitus with diabetic autonomic (poly)neuropathy: Secondary | ICD-10-CM | POA: Diagnosis not present

## 2016-04-22 DIAGNOSIS — R519 Headache, unspecified: Secondary | ICD-10-CM

## 2016-04-22 DIAGNOSIS — I5023 Acute on chronic systolic (congestive) heart failure: Secondary | ICD-10-CM | POA: Diagnosis not present

## 2016-04-22 DIAGNOSIS — Z955 Presence of coronary angioplasty implant and graft: Secondary | ICD-10-CM | POA: Insufficient documentation

## 2016-04-22 DIAGNOSIS — Z7984 Long term (current) use of oral hypoglycemic drugs: Secondary | ICD-10-CM | POA: Diagnosis not present

## 2016-04-22 DIAGNOSIS — Z794 Long term (current) use of insulin: Secondary | ICD-10-CM | POA: Diagnosis not present

## 2016-04-22 DIAGNOSIS — R51 Headache: Secondary | ICD-10-CM | POA: Diagnosis not present

## 2016-04-22 DIAGNOSIS — Z87891 Personal history of nicotine dependence: Secondary | ICD-10-CM | POA: Diagnosis not present

## 2016-04-22 DIAGNOSIS — I110 Hypertensive heart disease with heart failure: Secondary | ICD-10-CM | POA: Diagnosis not present

## 2016-04-22 DIAGNOSIS — Z8673 Personal history of transient ischemic attack (TIA), and cerebral infarction without residual deficits: Secondary | ICD-10-CM | POA: Insufficient documentation

## 2016-04-22 DIAGNOSIS — Z7902 Long term (current) use of antithrombotics/antiplatelets: Secondary | ICD-10-CM | POA: Insufficient documentation

## 2016-04-22 DIAGNOSIS — Z7982 Long term (current) use of aspirin: Secondary | ICD-10-CM | POA: Insufficient documentation

## 2016-04-22 DIAGNOSIS — R079 Chest pain, unspecified: Secondary | ICD-10-CM | POA: Diagnosis present

## 2016-04-22 DIAGNOSIS — R0789 Other chest pain: Secondary | ICD-10-CM | POA: Diagnosis not present

## 2016-04-22 LAB — I-STAT TROPONIN, ED
Troponin i, poc: 0 ng/mL (ref 0.00–0.08)
Troponin i, poc: 0 ng/mL (ref 0.00–0.08)

## 2016-04-22 LAB — HEPATIC FUNCTION PANEL
ALBUMIN: 3 g/dL — AB (ref 3.5–5.0)
ALT: 9 U/L — ABNORMAL LOW (ref 14–54)
AST: 13 U/L — ABNORMAL LOW (ref 15–41)
Alkaline Phosphatase: 60 U/L (ref 38–126)
Bilirubin, Direct: <0.1 mg/dL — ABNORMAL LOW (ref 0.1–0.5)
TOTAL PROTEIN: 6.9 g/dL (ref 6.5–8.1)
Total Bilirubin: 0.4 mg/dL (ref 0.3–1.2)

## 2016-04-22 LAB — CBC
HEMATOCRIT: 35.7 % — AB (ref 36.0–46.0)
Hemoglobin: 11.2 g/dL — ABNORMAL LOW (ref 12.0–15.0)
MCH: 27.4 pg (ref 26.0–34.0)
MCHC: 31.4 g/dL (ref 30.0–36.0)
MCV: 87.3 fL (ref 78.0–100.0)
PLATELETS: 293 10*3/uL (ref 150–400)
RBC: 4.09 MIL/uL (ref 3.87–5.11)
RDW: 15.2 % (ref 11.5–15.5)
WBC: 7.7 10*3/uL (ref 4.0–10.5)

## 2016-04-22 LAB — BASIC METABOLIC PANEL
Anion gap: 6 (ref 5–15)
BUN: 7 mg/dL (ref 6–20)
CHLORIDE: 110 mmol/L (ref 101–111)
CO2: 25 mmol/L (ref 22–32)
CREATININE: 0.91 mg/dL (ref 0.44–1.00)
Calcium: 8.9 mg/dL (ref 8.9–10.3)
Glucose, Bld: 155 mg/dL — ABNORMAL HIGH (ref 65–99)
POTASSIUM: 4.4 mmol/L (ref 3.5–5.1)
SODIUM: 141 mmol/L (ref 135–145)

## 2016-04-22 LAB — BRAIN NATRIURETIC PEPTIDE: B NATRIURETIC PEPTIDE 5: 63.1 pg/mL (ref 0.0–100.0)

## 2016-04-22 MED ORDER — DIPHENHYDRAMINE HCL 50 MG/ML IJ SOLN
25.0000 mg | Freq: Once | INTRAMUSCULAR | Status: AC
  Administered 2016-04-22: 25 mg via INTRAVENOUS
  Filled 2016-04-22: qty 1

## 2016-04-22 MED ORDER — SODIUM CHLORIDE 0.9 % IV BOLUS (SEPSIS)
1000.0000 mL | Freq: Once | INTRAVENOUS | Status: AC
  Administered 2016-04-22: 1000 mL via INTRAVENOUS

## 2016-04-22 MED ORDER — DEXAMETHASONE SODIUM PHOSPHATE 10 MG/ML IJ SOLN
10.0000 mg | Freq: Once | INTRAMUSCULAR | Status: AC
  Administered 2016-04-22: 10 mg via INTRAVENOUS
  Filled 2016-04-22: qty 1

## 2016-04-22 MED ORDER — KETOROLAC TROMETHAMINE 30 MG/ML IJ SOLN
30.0000 mg | Freq: Once | INTRAMUSCULAR | Status: AC
  Administered 2016-04-22: 30 mg via INTRAVENOUS
  Filled 2016-04-22: qty 1

## 2016-04-22 MED ORDER — PROCHLORPERAZINE EDISYLATE 5 MG/ML IJ SOLN
10.0000 mg | Freq: Once | INTRAMUSCULAR | Status: AC
  Administered 2016-04-22: 10 mg via INTRAVENOUS
  Filled 2016-04-22: qty 2

## 2016-04-22 NOTE — ED Notes (Signed)
Pt stable, states understanding of discharge instructions 

## 2016-04-22 NOTE — ED Provider Notes (Signed)
[facility] DEPT Provider Note   CSN: [id number] Arrival date & time: 04/22/16  1437     History   Chief Complaint Chief Complaint  Patient presents with  . Chest Pain    HPI [name] is a 45 y.o. female with a complicated medical history who presents with a headache. PMH significant for Lupus, Sjogren's syndrome, HTN, HLD, morbid obesity, migraines, hx of CVA with residual left sided weakness, DM, chronic pain syndrome, anxiety. She states she has had a headache for the past 2.5 weeks. She has migraine headaches frequently and sees Dr. Jairo Ben with Neuro at [facility]. She was last seen on 8/17 for a nerve block and was put on Topamax which she states has not helped. The headache is over her entire head, constant, feels like a squeezing pressure. She reports associated blurry vision, lightheadedness, photophobia, phonophobia. She started having N/V followed by chest pain 2 days ago. The chest pain is in the center of her chest, constant, non-radiating. She takes Morphine daily which makes the pain better but it soon returns. Denies fever but states some times she feels "hot" followed by chills. She endorses SOB which is worse when lying flat and with exertion however this is chronic. Also reports dry cough. She also endorses chronic abdominal pain which she attributes to her gastroparesis which is unchanged. Denies syncope, neck pain, URI symptoms, dysuria, vaginal complaints.  HPI  Past Medical History:  Diagnosis Date  . Acute renal failure (HCC)     Intractable nausea vomiting secondary to diabetic gastroparesis causing dehydration and acute renal failure /notes 04/01/2013  . Anginal pain ([place])   . Anxiety   . Bell's palsy 08/09/2010  . CAP (community acquired pneumonia) 10/2011   Archie Endo 11/08/2011  . Chest pain 01/13/2014  . CHF (congestive heart failure) ([place])   . Diabetic gastroparesis associated with type 2 diabetes mellitus ([place])    this is presumed diagnoses, not confirmed  by any studies.   . Family history of malignant neoplasm of breast   . Family history of malignant neoplasm of ovary   . GERD (gastroesophageal reflux disease)   . High cholesterol   . Hypertension   . Liver mass 2014   biopsied 03/2013 at [facility], not malignant.  is to undergo radiologic ablation of the mass in sept/October 2014.   . Migraines    "maybe a couple times/yr" (04/01/2013)  . Obesity   . Obstructive sleep apnea on CPAP 2011  . On home oxygen therapy    "2L 24/7" (02/28/2015)  . SJOGREN'S SYNDROME 08/09/2010  . SLE (systemic lupus erythematosus) ([place])    Archie Endo 12/01/2010  . Stroke [facility]) 2010; 10/2012   "left side is still weak from it, never fully regained full strength; no additions from stroke 10/2012"    Patient Active Problem List   Diagnosis Date Noted  . Diabetes ([place]) 09/19/2015  . Intractable pain 02/27/2015  . Radiculopathy of cervical spine   . Severe headache 03/05/2014  . Family history of malignant neoplasm of breast   . Family history of malignant neoplasm of ovary   . Intractable nausea and vomiting 11/11/2013  . Septate uterus 09/08/2013  . Diabetic gastroparesis ([place]) 04/28/2013  . Constipation due to pain medication 04/21/2013  . Odynophagia 04/04/2013  . Liver mass 02/28/2013  . Reflux esophagitis 02/26/2013  . Morbid obesity ([place]) 10/10/2012  . CVA (cerebral infarction) 10/09/2012  . Chronic respiratory failure with hypoxia ([place]) 10/09/2012  . Clinical polymyositis syndrome ([place]) 09/06/2012  .  Mononeuritis of lower limb 01/12/2012  . Shortness of breath 11/08/2011  . HTN (hypertension) 11/08/2011  . Systolic CHF, acute on chronic ([place]) 11/08/2011  . HLD (hyperlipidemia) 07/19/2011  . Bell's palsy 08/09/2010  . SLE 08/09/2010  . [place] SYNDROME 08/09/2010    Past Surgical History:  Procedure Laterality Date  . BREAST CYST EXCISION Left 08/2005   epidermoid  . CARDIAC CATHETERIZATION  02/07/12  . ECTOPIC PREGNANCY SURGERY  1999  .  ESOPHAGOGASTRODUODENOSCOPY N/A 02/26/2013   Procedure: ESOPHAGOGASTRODUODENOSCOPY (EGD);  Surgeon: Irene Shipper, MD;  Location: [facility] ENDOSCOPY;  Service: Endoscopy;  Laterality: N/A;  . ESOPHAGOGASTRODUODENOSCOPY N/A 03/02/2015   Procedure: ESOPHAGOGASTRODUODENOSCOPY (EGD);  Surgeon: Milus Banister, MD;  Location: [facility];  Service: Endoscopy;  Laterality: N/A;  . HERNIA REPAIR    . LEFT AND RIGHT HEART CATHETERIZATION WITH CORONARY ANGIOGRAM N/A 02/07/2012   Procedure: LEFT AND RIGHT HEART CATHETERIZATION WITH CORONARY ANGIOGRAM;  Surgeon: Laverda Page, MD;  Location: [facility] CATH LAB;  Service: Cardiovascular;  Laterality: N/A;  . LIVER BIOPSY  03/2013   liver mass/medical hx noted above  . MUSCLE BIOPSY     for lupus/notes 12/01/2010  . UMBILICAL HERNIA REPAIR  1980's    OB History    Gravida Para Term Preterm AB Living   _0 SAB TAB Ectopic Multiple Live Births       1   1       Home Medications    Prior to Admission medications   Medication Sig Start Date End Date Taking? Authorizing Provider  albuterol (PROAIR HFA) 108 (90 BASE) MCG/ACT inhaler Inhale 2 puffs into the lungs every 6 (six) hours as needed for wheezing or shortness of breath.     Historical Provider, MD  albuterol (PROVENTIL) (5 MG/ML) 0.5% nebulizer solution Take 2.5 mg by nebulization every 6 (six) hours as needed for wheezing or shortness of breath.  06/24/14   Historical Provider, MD  alendronate (FOSAMAX) 70 MG tablet Take 70 mg by mouth every Saturday. Take with a full glass of water on an empty stomach.    Historical Provider, MD  aspirin EC 81 MG tablet Take 81 mg by mouth daily.    Historical Provider, MD  azaTHIOprine (IMURAN) 50 MG tablet Take 100 mg by mouth 2 (two) times daily.     Historical Provider, MD  BD INSULIN SYRINGE U-500 31G X 6MM 0.5 ML MISC USE TO INJECT INSULIN AS DIRECTED 01/15/16   Renato Shin, MD  Blood Glucose Monitoring Suppl (ACCU-CHEK AVIVA PLUS) w/Device KIT Use to check  blood sugar 4 times per day. 01/13/16   Renato Shin, MD  carvedilol (COREG) 6.25 MG tablet Take 6.25 mg by mouth 2 (two) times daily with a meal.    Historical Provider, MD  cetirizine (ZYRTEC) 10 MG tablet Take 10 mg by mouth daily.    Historical Provider, MD  clopidogrel (PLAVIX) 75 MG tablet Take 75 mg by mouth daily.    Historical Provider, MD  clotrimazole-betamethasone (LOTRISONE) cream Apply 1 application topically daily. To feet    Historical Provider, MD  cycloSPORINE (RESTASIS) 0.05 % ophthalmic emulsion Place 1 drop into both eyes 3 (three) times daily.    Historical Provider, MD  doxycycline (VIBRA-TABS) 100 MG tablet Take 1 tablet (100 mg total) by mouth 2 (two) times daily. 03/18/16   Renato Shin, MD  famotidine (PEPCID) 20 MG tablet Take 20 mg by mouth 2 (two) times daily.  Historical Provider, MD  fluorometholone (FML) 0.1 % ophthalmic suspension Place 1 drop into both eyes 2 (two) times daily. 02/11/15   Historical Provider, MD  glucose blood (ACCU-CHEK AVIVA) test strip 1 each by Other route 4 (four) times daily. And lancets 4/day 09/18/15   Renato Shin, MD  hydrochlorothiazide (HYDRODIURIL) 25 MG tablet Take 25 mg by mouth daily.    Historical Provider, MD  HYDROcodone-acetaminophen (NORCO/VICODIN) 5-325 MG tablet Take 1 tablet by mouth every 6 (six) hours as needed for moderate pain. 01/05/16   Dalia Heading, PA-C  hydroxychloroquine (PLAQUENIL) 200 MG tablet Take 200 mg by mouth 2 (two) times daily.     Historical Provider, MD  insulin regular human CONCENTRATED (HUMULIN R) 500 UNIT/ML injection 3 times a day (just before each meal) 70-90-70 units. 03/18/16   Renato Shin, MD  LORazepam (ATIVAN) 0.5 MG tablet Take 1 tablet (0.5 mg total) by mouth every 8 (eight) hours. 01/05/16   Dalia Heading, PA-C  megestrol (MEGACE) 40 MG tablet Take 40 mg by mouth daily. 01/30/15   Historical Provider, MD  metFORMIN (GLUCOPHAGE-XR) 500 MG 24 hr tablet Take 1,000 mg by mouth 2 (two) times  daily.    Historical Provider, MD  morphine (MSIR) 15 MG tablet Take 30 mg by mouth every evening.     Historical Provider, MD  NUVARING 0.12-0.015 MG/24HR vaginal ring Place 1 each vaginally every 21 ( twenty-one) days.  01/28/15   Historical Provider, MD  PAZEO 0.7 % SOLN Place 1 drop into both eyes every morning. 02/11/15   Historical Provider, MD  Polyvinyl Alcohol-Povidone (REFRESH OP) Place 1 drop into both eyes 2 (two) times daily.    Historical Provider, MD  potassium chloride SA (K-DUR,KLOR-CON) 20 MEQ tablet Take 20 mEq by mouth 2 (two) times daily.    Historical Provider, MD  predniSONE (DELTASONE) 5 MG tablet Take 5 mg by mouth daily. 02/25/15   Historical Provider, MD  pregabalin (LYRICA) 75 MG capsule Take 75 mg by mouth 2 (two) times daily. 01/28/15   Historical Provider, MD  torsemide (DEMADEX) 20 MG tablet Take 1 tablet (20 mg total) by mouth 2 (two) times daily. 04/06/13   Elmarie Shiley, MD    Family History Family History  Problem Relation Age of Onset  . Diabetes Mother   . Breast cancer Maternal Aunt 81    currently 49  . Stomach cancer Maternal Uncle     dx 22s; deceased  . Prostate cancer Maternal Uncle     deceased 32s  . Cancer Maternal Aunt     unk. primary  . Cancer Maternal Aunt     unk. primary  . Ovarian cancer Maternal Aunt     deceased 77s  . Hypertension Other   . Stroke Other   . Arthritis Other   . Ovarian cancer Sister 39    maternal half-sister; RAD51C positive  . Cancer Paternal Aunt     unk. primary; deceased 23s  . Prostate cancer Paternal Uncle     deceased 24s  . Breast cancer Cousin     deceased 50s; daughter of mat uncle with stomach ca  . Breast cancer Maternal Aunt     deceased 71s    Social History Social History  Substance Use Topics  . Smoking status: Former Smoker    Years: 3.00    Types: Cigarettes    Quit date: 06/01/1990  . Smokeless tobacco: Never Used  . Alcohol use No     Allergies  Metoclopramide;  Oxycodone-acetaminophen; and Codeine   Review of Systems Review of Systems  Constitutional: Positive for chills. Negative for fever.  HENT: Negative for congestion, ear pain, rhinorrhea and sore throat.   Eyes: Positive for photophobia and visual disturbance.       Blurry vision is chronic  Respiratory: Positive for cough and shortness of breath.   Cardiovascular: Positive for chest pain.  Gastrointestinal: Positive for abdominal pain, nausea and vomiting.       Abdominal pain is chronic  Genitourinary: Negative for dysuria, vaginal bleeding and vaginal discharge.  Musculoskeletal: Negative for neck pain.  Neurological: Positive for weakness, light-headedness and headaches. Negative for dizziness, syncope and numbness.       Chronic left sided weakness     Physical Exam Updated Vital Signs BP 154/93   Pulse 84   Temp 98.9 F (37.2 C) (Oral)   Resp 18   LMP 03/22/2016 (Within Weeks)   SpO2 99%   Physical Exam  Constitutional: She is oriented to person, place, and time. She appears well-developed and well-nourished. No distress.  Morbidly obese  HENT:  Head: Normocephalic and atraumatic.  Eyes: Conjunctivae are normal. Pupils are equal, round, and reactive to light. Right eye exhibits no discharge. Left eye exhibits no discharge. No scleral icterus.  Neck: Normal range of motion. Neck supple.  Cardiovascular: Normal rate and regular rhythm.  Exam reveals no gallop and no friction rub.   No murmur heard. Pulmonary/Chest: Effort normal and breath sounds normal. No respiratory distress. She has no wheezes. She has no rales. She exhibits no tenderness.  Abdominal: Soft. Bowel sounds are normal. She exhibits no distension. There is no tenderness.  Musculoskeletal: She exhibits no edema.  Neurological: She is alert and oriented to person, place, and time.  Mental Status:  Alert, oriented, thought content appropriate, able to give a coherent history. Speech fluent without evidence  of aphasia. Able to follow 2 step commands without difficulty.  Cranial Nerves:  II:  Peripheral visual fields grossly normal, pupils equal, round, reactive to light III,IV, VI: ptosis not present, extra-ocular motions intact bilaterally  V,VII: smile symmetric, facial light touch sensation equal VIII: hearing grossly normal to voice  X: uvula elevates symmetrically  XI: bilateral shoulder shrug symmetric and strong XII: midline tongue extension without fassiculations Motor:  Normal tone. 5/5 in upper and lower extremities on right side and 4/5 on left side  Sensory: Pinprick and light touch normal in all extremities.  Deep Tendon Reflexes: 2+ and symmetric in the biceps and patella Cerebellar: normal finger-to-nose with bilateral upper extremities Gait: normal gait and balance CV: distal pulses palpable throughout    Skin: Skin is warm and dry.  Psychiatric: She has a normal mood and affect. Her behavior is normal.  Nursing note and vitals reviewed.    ED Treatments / Results  Labs (all labs ordered are listed, but only abnormal results are displayed) Labs Reviewed  BASIC METABOLIC PANEL - Abnormal; Notable for the following:       Result Value   Glucose, Bld 155 (*)    All other components within normal limits  CBC - Abnormal; Notable for the following:    Hemoglobin 11.2 (*)    HCT 35.7 (*)    All other components within normal limits  HEPATIC FUNCTION PANEL - Abnormal; Notable for the following:    Albumin 3.0 (*)    AST 13 (*)    ALT 9 (*)    Bilirubin, Direct <0.1 (*)  All other components within normal limits  BRAIN NATRIURETIC PEPTIDE  I-STAT TROPOININ, ED  I-STAT TROPOININ, ED    EKG  EKG Interpretation  Date/Time:  Friday April 22 2016 14:41:39 EDT Ventricular Rate:  93 PR Interval:  144 QRS Duration: 92 QT Interval:  356 QTC Calculation: 442 R Axis:   80 Text Interpretation:  Sinus rhythm with occasional Premature ventricular complexes ST & T  wave abnormality, consider inferior ischemia Abnormal ECG Similar to previous Confirmed by ZAVITZ MD, JOSHUA [phone]) on 04/22/2016 5:13:14 PM       Radiology Dg Chest 2 View  Result Date: 04/22/2016 CLINICAL DATA:  Left-sided chest pain for 2 weeks, dyspnea and chest pressure. EXAM: CHEST  2 VIEW COMPARISON:  Chest CT from 04/19/2013, chest radiographs from 11/23/2015 and priors FINDINGS: There is stable cardiomegaly. The aorta is not aneurysmal. No pneumonic consolidation or CHF. No effusion or pneumothorax. No suspicious osseous abnormalities. IMPRESSION: Cardiomegaly.  No acute pulmonary disease. Electronically Signed   By: Ashley Royalty M.D.   On: 04/22/2016 15:42    Procedures Procedures (including critical care time)  Medications Ordered in ED Medications  sodium chloride 0.9 % bolus 1,000 mL (1,000 mLs Intravenous New Bag/Given 04/22/16 1845)  ketorolac (TORADOL) 30 MG/ML injection 30 mg (30 mg Intravenous Given 04/22/16 1846)  diphenhydrAMINE (BENADRYL) injection 25 mg (25 mg Intravenous Given 04/22/16 1846)  prochlorperazine (COMPAZINE) injection 10 mg (10 mg Intravenous Given 04/22/16 1846)  dexamethasone (DECADRON) injection 10 mg (10 mg Intravenous Given 04/22/16 1846)     Initial Impression / Assessment and Plan / ED Course  I have reviewed the triage vital signs and the nursing notes.  Pertinent labs & imaging results that were available during my care of the patient were reviewed by me and considered in my medical decision making (see chart for details).  Clinical Course   45 year old female presents with headache and a constellation of associated symptoms. Patient is afebrile, not tachycardic or tachypneic, and not hypoxic. She is mildly hypertensive. Chest pain work up is reassuring and pain is atypical. EKG is NSR and shows no significant change since last. CXR is negative. Initial and 2nd troponin is 0. Labs are overall unremarkable and unchanged. HEART score is 3.   For  her headache she has left sided weakness which she states is worse and is present on exam. No other focal deficits. Migraine cocktail given and Head CT ordered which was negative.   On recheck patient states she is hungry and feels better. Dicussed results and advised neuro follow up. Patient is NAD, non-toxic, with stable VS. Patient is informed of clinical course, understands medical decision making process, and agrees with plan. Opportunity for questions provided and all questions answered. Return precautions given.  Final Clinical Impressions(s) / ED Diagnoses   Final diagnoses:  Atypical chest pain  Nonintractable headache, unspecified chronicity pattern, unspecified headache type    New Prescriptions New Prescriptions   No medications on file     Recardo Evangelist, PA-C 04/22/16 2150    Elnora Morrison, MD 04/25/16 [phone]

## 2016-04-22 NOTE — Discharge Instructions (Signed)
Please follow up with your neurologist if you headaches are not improving

## 2016-04-22 NOTE — ED Triage Notes (Addendum)
Pt here with shooting central chest pain, worse when laying down "like im suffocating." onset "a couple of days ago." pt also reports SOB, loose stools, vomiting and headaches, dizziness, light-headedness, back pain, abdominal pain and blurry vision.

## 2016-04-27 ENCOUNTER — Other Ambulatory Visit: Payer: Self-pay

## 2016-04-27 MED ORDER — GLUCOSE BLOOD VI STRP: 1.0000 | ORAL_STRIP | Freq: Four times a day (QID) | 5 refills | Status: DC

## 2016-04-27 NOTE — Telephone Encounter (Signed)
I contacted the patient and advised of message with new insulin instructions. Patient voiced understanding. Patient stated she had received a steroid injection during her hospital visit on 04/22/2016

## 2016-04-27 NOTE — Telephone Encounter (Signed)
Please increase to 80 units of the humulin R before breakfast, 110 before lunch and 80 units before supper. Please call 2 days to report cbg's. Any reason you know why cbg's have been high, such as steroids?

## 2016-04-27 NOTE — Telephone Encounter (Signed)
Patient called stating her blood sugars have been running over 200. Patient stated the blood sugar is the highest around supper time and has hit the 300 mark a few times. Patient confirmed she is taking 70 units of the humulin R before breakfast, 90 before lunch and 70 units before supper. Please advise if any changes should be made. Thanks!

## 2016-04-28 ENCOUNTER — Telehealth: Payer: Self-pay | Admitting: Endocrinology

## 2016-04-28 MED ORDER — GLUCOSE BLOOD VI STRP: 1.0000 | ORAL_STRIP | Freq: Four times a day (QID) | 5 refills | Status: DC

## 2016-04-28 NOTE — Telephone Encounter (Signed)
I contacted the patient. She stated the pharmacy did not received the Accu-chek aviva test strips we had submitted on 04/27/2016. Refill submitted again. Patient advised to call back if she had any issues picking the medication up. Patient voiced understanding.

## 2016-04-28 NOTE — Telephone Encounter (Signed)
Patient stated the wrong prescription was sent to the pharmacy. Please advise

## 2016-05-06 ENCOUNTER — Other Ambulatory Visit: Payer: Self-pay | Admitting: Family Medicine

## 2016-05-09 ENCOUNTER — Other Ambulatory Visit: Payer: Self-pay | Admitting: Family Medicine

## 2016-05-09 DIAGNOSIS — M7989 Other specified soft tissue disorders: Secondary | ICD-10-CM

## 2016-05-13 ENCOUNTER — Ambulatory Visit
Admission: RE | Admit: 2016-05-13 | Discharge: 2016-05-13 | Disposition: A | Discharge status: Home / Self Care | Payer: Medicaid Other | Source: Ambulatory Visit | Attending: Family Medicine | Admitting: Family Medicine

## 2016-05-13 DIAGNOSIS — M7989 Other specified soft tissue disorders: Secondary | ICD-10-CM

## 2016-05-25 ENCOUNTER — Ambulatory Visit: Payer: Medicaid Other | Admitting: Endocrinology

## 2016-06-01 ENCOUNTER — Ambulatory Visit (INDEPENDENT_AMBULATORY_CARE_PROVIDER_SITE_OTHER): Payer: Medicaid Other | Admitting: Endocrinology

## 2016-06-01 ENCOUNTER — Encounter: Payer: Self-pay | Admitting: Endocrinology

## 2016-06-01 VITALS — BP 158/90 | HR 88 | Ht 68.0 in | Wt >= 6400 oz

## 2016-06-01 DIAGNOSIS — Z794 Long term (current) use of insulin: Secondary | ICD-10-CM | POA: Diagnosis not present

## 2016-06-01 DIAGNOSIS — E1143 Type 2 diabetes mellitus with diabetic autonomic (poly)neuropathy: Secondary | ICD-10-CM | POA: Diagnosis not present

## 2016-06-01 LAB — POCT GLYCOSYLATED HEMOGLOBIN (HGB A1C): Hemoglobin A1C: 6.7

## 2016-06-01 MED ORDER — INSULIN REGULAR HUMAN (CONC) 500 UNIT/ML ~~LOC~~ SOLN: SUBCUTANEOUS | 11 refills | Status: DC

## 2016-06-01 NOTE — Patient Instructions (Addendum)
check your blood sugar 4 times a day: before the 3 meals, and at bedtime.  also check if you have symptoms of your blood sugar being too high or too low.  please keep a record of the readings and bring it to your next appointment here (or you can bring the meter itself).  You can write it on any piece of paper.  please call us sooner if your blood sugar goes below 70, or if you have a lot of readings over 200. Please come back for a follow-up appointment in 3 months.  Please continue the same insulin.

## 2016-06-01 NOTE — Progress Notes (Signed)
Subjective:    Patient ID: [name], female    DOB: Dec 26, 1970, 45 y.o.   MRN: [id number]  HPI Pt returns for f/u of diabetes mellitus: DM type: Insulin-requiring type 2 Dx'ed: 4098 Complications: polyneuropathy and gastroparesis.  Therapy: insulin (since 2014), and metformin.   GDM: never DKA: never Severe hypoglycemia: never Pancreatitis: never Other: she is too ill to undergo weight-loss surgery; she takes multiple daily injections of U-500; in early 2015, she was hospitalized with n/v, due to gastroparesis; pt says she is not at risk for pregnancy; she has U-500 syringes.  The pattern of her cbg's indicates she does not need basal insulin.   Interval history: prednisone is now 2.5 mg daily.  no cbg record, but states cbg's vary from 58-200's.  She says it is lowest fasting, and higher at other times of day.  She says she never misses the insulin.  She takes QAC: 90-110-90 units.   Past Medical History:  Diagnosis Date  . Acute renal failure (HCC)     Intractable nausea vomiting secondary to diabetic gastroparesis causing dehydration and acute renal failure /notes 04/01/2013  . Anginal pain ([place])   . Anxiety   . Bell's palsy 08/09/2010  . CAP (community acquired pneumonia) 10/2011   Archie Endo 11/08/2011  . Chest pain 01/13/2014  . CHF (congestive heart failure) ([place])   . Diabetic gastroparesis associated with type 2 diabetes mellitus ([place])    this is presumed diagnoses, not confirmed by any studies.   . Family history of malignant neoplasm of breast   . Family history of malignant neoplasm of ovary   . GERD (gastroesophageal reflux disease)   . High cholesterol   . Hypertension   . Liver mass 2014   biopsied 03/2013 at [facility], not malignant.  is to undergo radiologic ablation of the mass in sept/October 2014.   . Migraines    "maybe a couple times/yr" (04/01/2013)  . Obesity   . Obstructive sleep apnea on CPAP 2011  . On home oxygen therapy    "2L 24/7" (02/28/2015)  .  SJOGREN'S SYNDROME 08/09/2010  . SLE (systemic lupus erythematosus) ([place])    Archie Endo 12/01/2010  . Stroke [facility]) 2010; 10/2012   "left side is still weak from it, never fully regained full strength; no additions from stroke 10/2012"    Past Surgical History:  Procedure Laterality Date  . BREAST CYST EXCISION Left 08/2005   epidermoid  . CARDIAC CATHETERIZATION  02/07/12  . ECTOPIC PREGNANCY SURGERY  1999  . ESOPHAGOGASTRODUODENOSCOPY N/A 02/26/2013   Procedure: ESOPHAGOGASTRODUODENOSCOPY (EGD);  Surgeon: Irene Shipper, MD;  Location: [facility] ENDOSCOPY;  Service: Endoscopy;  Laterality: N/A;  . ESOPHAGOGASTRODUODENOSCOPY N/A 03/02/2015   Procedure: ESOPHAGOGASTRODUODENOSCOPY (EGD);  Surgeon: Milus Banister, MD;  Location: [place];  Service: Endoscopy;  Laterality: N/A;  . HERNIA REPAIR    . LEFT AND RIGHT HEART CATHETERIZATION WITH CORONARY ANGIOGRAM N/A 02/07/2012   Procedure: LEFT AND RIGHT HEART CATHETERIZATION WITH CORONARY ANGIOGRAM;  Surgeon: Laverda Page, MD;  Location: [facility] CATH LAB;  Service: Cardiovascular;  Laterality: N/A;  . LIVER BIOPSY  03/2013   liver mass/medical hx noted above  . MUSCLE BIOPSY     for lupus/notes 12/01/2010  . UMBILICAL HERNIA REPAIR  1980's    Social History   Social History  . Marital status: Single    Spouse name: N/A  . Number of children: N/A  . Years of education: N/A   Occupational History  .  Unemployed  student   Social History Main Topics  . Smoking status: Former Smoker    Years: 3.00    Types: Cigarettes    Quit date: 06/01/1990  . Smokeless tobacco: Never Used  . Alcohol use No  . Drug use: No  . Sexual activity: Yes    Partners: Male    Birth control/ protection: None   Other Topics Concern  . Not on file   Social History Narrative   Regular exercise-yes    Current Outpatient Prescriptions on File Prior to Visit  Medication Sig Dispense Refill  . albuterol (PROAIR HFA) 108 (90 BASE) MCG/ACT inhaler Inhale 2 puffs into the  lungs every 6 (six) hours as needed for wheezing or shortness of breath.     Marland Kitchen albuterol (PROVENTIL) (5 MG/ML) 0.5% nebulizer solution Take 2.5 mg by nebulization every 6 (six) hours as needed for wheezing or shortness of breath.   5  . alendronate (FOSAMAX) 70 MG tablet Take 70 mg by mouth every Saturday. Take with a full glass of water on an empty stomach.    Marland Kitchen aspirin EC 81 MG tablet Take 81 mg by mouth daily.    Marland Kitchen azaTHIOprine (IMURAN) 50 MG tablet Take 100 mg by mouth 2 (two) times daily.     . BD INSULIN SYRINGE U-500 31G X 6MM 0.5 ML MISC USE TO INJECT INSULIN AS DIRECTED 100 each 1  . Blood Glucose Monitoring Suppl (ACCU-CHEK AVIVA PLUS) w/Device KIT Use to check blood sugar 4 times per day. 1 kit 2  . carvedilol (COREG) 25 MG tablet Take 25 mg by mouth 2 (two) times daily with a meal.    . cetirizine (ZYRTEC) 10 MG tablet Take 10 mg by mouth daily.    . clopidogrel (PLAVIX) 75 MG tablet Take 75 mg by mouth daily.    . clotrimazole-betamethasone (LOTRISONE) cream Apply 1 application topically daily. To feet    . cycloSPORINE (RESTASIS) 0.05 % ophthalmic emulsion Place 1 drop into both eyes 3 (three) times daily.    . famotidine (PEPCID) 20 MG tablet Take 20 mg by mouth 2 (two) times daily.    Marland Kitchen glucose blood (ACCU-CHEK AVIVA) test strip 1 each by Other route 4 (four) times daily. 150 each 5  . hydrochlorothiazide (HYDRODIURIL) 25 MG tablet Take 25 mg by mouth daily.    . hydroxychloroquine (PLAQUENIL) 200 MG tablet Take 200 mg by mouth 2 (two) times daily.     . megestrol (MEGACE) 40 MG tablet Take 40 mg by mouth daily.  3  . metFORMIN (GLUCOPHAGE-XR) 500 MG 24 hr tablet Take 1,000 mg by mouth 2 (two) times daily.    Marland Kitchen morphine (MSIR) 15 MG tablet Take 15 mg by mouth every 12 (twelve) hours.     Marland Kitchen NUVARING 0.12-0.015 MG/24HR vaginal ring Place 1 each vaginally every 21 ( twenty-one) days.   12  . PAZEO 0.7 % SOLN Place 1 drop into both eyes every morning.  3  . Polyvinyl Alcohol-Povidone  (REFRESH OP) Place 1 drop into both eyes 2 (two) times daily.    . potassium chloride SA (K-DUR,KLOR-CON) 20 MEQ tablet Take 20 mEq by mouth 2 (two) times daily.    . predniSONE (DELTASONE) 5 MG tablet Take 5 mg by mouth daily.    . pregabalin (LYRICA) 75 MG capsule Take 75 mg by mouth 2 (two) times daily.    Marland Kitchen torsemide (DEMADEX) 20 MG tablet Take 1 tablet (20 mg total) by mouth 2 (two) times daily.  30 tablet 0  . [DISCONTINUED] promethazine (PHENERGAN) 25 MG tablet Take 1 tablet (25 mg total) by mouth every 8 (eight) hours as needed for nausea or vomiting. 15 tablet 0   No current facility-administered medications on file prior to visit.     Allergies  Allergen Reactions  . Metoclopramide Other (See Comments)    Developed restless leg, akathisia type limb movements.   . Oxycodone-Acetaminophen Hives and Rash    FYI: no reaction to hydrocodone; tolerates dilaudid  . Codeine Itching, Rash and Other (See Comments)    (Derm)    Family History  Problem Relation Age of Onset  . Diabetes Mother   . Breast cancer Maternal Aunt 40    currently 68  . Stomach cancer Maternal Uncle     dx 42s; deceased  . Prostate cancer Maternal Uncle     deceased 64s  . Cancer Maternal Aunt     unk. primary  . Cancer Maternal Aunt     unk. primary  . Ovarian cancer Maternal Aunt     deceased 47s  . Hypertension Other   . Stroke Other   . Arthritis Other   . Ovarian cancer Sister 70    maternal half-sister; RAD51C positive  . Cancer Paternal Aunt     unk. primary; deceased 56s  . Prostate cancer Paternal Uncle     deceased 23s  . Breast cancer Cousin     deceased 66s; daughter of mat uncle with stomach ca  . Breast cancer Maternal Aunt     deceased 61s    BP (!) 158/90   Pulse 88   Ht _0  (1.727 m)   Wt (!) 412 lb (186.9 kg)   SpO2 96%   BMI 62.64 kg/m    Review of Systems Denies LOC    Objective:   Physical Exam VITAL SIGNS:  See vs page GENERAL: no distress.  Morbid  obesity.  Pulses: dorsalis pedis intact bilat.   MSK: no deformity of the feet CV: trace bilat leg edema Skin:  no ulcer on the feet.  normal color and temp on the feet. Neuro: sensation is intact to touch on the feet.   Lab Results  Component Value Date   HGBA1C 6.7 06/01/2016      Assessment & Plan:  Insulin-requiring type 2 DM, with gastroparesis: well-controlled.   Patient is advised the following: Patient Instructions  check your blood sugar 4 times a day: before the 3 meals, and at bedtime.  also check if you have symptoms of your blood sugar being too high or too low.  please keep a record of the readings and bring it to your next appointment here (or you can bring the meter itself).  You can write it on any piece of paper.  please call us sooner if your blood sugar goes below 70, or if you have a lot of readings over 200. Please come back for a follow-up appointment in 3 months.  Please continue the same insulin.

## 2016-06-28 ENCOUNTER — Other Ambulatory Visit: Payer: Self-pay | Admitting: Endocrinology

## 2016-08-23 ENCOUNTER — Other Ambulatory Visit: Payer: Self-pay | Admitting: Endocrinology

## 2016-09-01 ENCOUNTER — Ambulatory Visit: Payer: Medicaid Other | Admitting: Endocrinology

## 2016-09-02 ENCOUNTER — Ambulatory Visit (INDEPENDENT_AMBULATORY_CARE_PROVIDER_SITE_OTHER): Payer: Medicaid Other | Admitting: Endocrinology

## 2016-09-02 ENCOUNTER — Encounter: Payer: Self-pay | Admitting: Endocrinology

## 2016-09-02 VITALS — BP 136/82 | HR 103 | Ht 68.0 in | Wt >= 6400 oz

## 2016-09-02 DIAGNOSIS — E1143 Type 2 diabetes mellitus with diabetic autonomic (poly)neuropathy: Secondary | ICD-10-CM

## 2016-09-02 DIAGNOSIS — Z794 Long term (current) use of insulin: Secondary | ICD-10-CM | POA: Diagnosis not present

## 2016-09-02 LAB — POCT GLYCOSYLATED HEMOGLOBIN (HGB A1C): HEMOGLOBIN A1C: 6.5

## 2016-09-02 NOTE — Progress Notes (Signed)
Subjective:    Patient ID: [name], female    DOB: [birth year], 46 y.o.   MRN: [id number]  HPI Pt returns for f/u of diabetes mellitus: DM type: Insulin-requiring type 2 Dx'ed: 7989 Complications: polyneuropathy and gastroparesis.  Therapy: insulin (since 2014), and metformin.   GDM: never DKA: never Severe hypoglycemia: never Pancreatitis: never Other: she is too ill to undergo weight-loss surgery; she takes multiple daily injections of U-500; in early 2015, she was hospitalized with n/v, due to gastroparesis; pt says she is not at risk for pregnancy; she has U-500 syringes.  The pattern of her cbg's indicates she does not need basal insulin.   Interval history: she is off prednisone now.  no cbg record, but states cbg's in general higher as the day goes on.  She says she never misses the insulin. She seldom has hypoglycemia, and these episodes are mild. This only happened during a recent illness.   Past Medical History:  Diagnosis Date  . Acute renal failure (HCC)     Intractable nausea vomiting secondary to diabetic gastroparesis causing dehydration and acute renal failure /notes 04/01/2013  . Anginal pain ([place])   . Anxiety   . Bell's palsy 08/09/2010  . CAP (community acquired pneumonia) 10/2011   Archie Endo 11/08/2011  . Chest pain 01/13/2014  . CHF (congestive heart failure) ([employer])   . Diabetic gastroparesis associated with type 2 diabetes mellitus ([place])    this is presumed diagnoses, not confirmed by any studies.   . Family history of malignant neoplasm of breast   . Family history of malignant neoplasm of ovary   . GERD (gastroesophageal reflux disease)   . High cholesterol   . Hypertension   . Liver mass 2014   biopsied 03/2013 at [facility], not malignant.  is to undergo radiologic ablation of the mass in sept/October 2014.   . Migraines    "maybe a couple times/yr" (04/01/2013)  . Obesity   . Obstructive sleep apnea on CPAP 2011  . On home oxygen therapy    "2L 24/7"  (02/28/2015)  . SJOGREN'S SYNDROME 08/09/2010  . SLE (systemic lupus erythematosus) ([place])    Archie Endo 12/01/2010  . Stroke [facility]) 2010; 10/2012   "left side is still weak from it, never fully regained full strength; no additions from stroke 10/2012"    Past Surgical History:  Procedure Laterality Date  . BREAST CYST EXCISION Left 08/2005   epidermoid  . CARDIAC CATHETERIZATION  02/07/12  . ECTOPIC PREGNANCY SURGERY  1999  . ESOPHAGOGASTRODUODENOSCOPY N/A 02/26/2013   Procedure: ESOPHAGOGASTRODUODENOSCOPY (EGD);  Surgeon: Irene Shipper, MD;  Location: [facility] ENDOSCOPY;  Service: Endoscopy;  Laterality: N/A;  . ESOPHAGOGASTRODUODENOSCOPY N/A 03/02/2015   Procedure: ESOPHAGOGASTRODUODENOSCOPY (EGD);  Surgeon: Milus Banister, MD;  Location: [place];  Service: Endoscopy;  Laterality: N/A;  . HERNIA REPAIR    . LEFT AND RIGHT HEART CATHETERIZATION WITH CORONARY ANGIOGRAM N/A 02/07/2012   Procedure: LEFT AND RIGHT HEART CATHETERIZATION WITH CORONARY ANGIOGRAM;  Surgeon: Laverda Page, MD;  Location: [facility] CATH LAB;  Service: Cardiovascular;  Laterality: N/A;  . LIVER BIOPSY  03/2013   liver mass/medical hx noted above  . MUSCLE BIOPSY     for lupus/notes 12/01/2010  . UMBILICAL HERNIA REPAIR  1980's    Social History   Social History  . Marital status: Single    Spouse name: N/A  . Number of children: N/A  . Years of education: N/A   Occupational History  .  Unemployed  student   Social History Main Topics  . Smoking status: Former Smoker    Years: 3.00    Types: Cigarettes    Quit date: 06/01/1990  . Smokeless tobacco: Never Used  . Alcohol use No  . Drug use: No  . Sexual activity: Yes    Partners: Male    Birth control/ protection: None   Other Topics Concern  . Not on file   Social History Narrative   Regular exercise-yes    Current Outpatient Prescriptions on File Prior to Visit  Medication Sig Dispense Refill  . ACCU-CHEK SOFTCLIX LANCETS lancets USE TO TEST BLOOD SUGAR FOUR  TIMES DAILY. 102 each 11  . albuterol (PROAIR HFA) 108 (90 BASE) MCG/ACT inhaler Inhale 2 puffs into the lungs every 6 (six) hours as needed for wheezing or shortness of breath.     Marland Kitchen albuterol (PROVENTIL) (5 MG/ML) 0.5% nebulizer solution Take 2.5 mg by nebulization every 6 (six) hours as needed for wheezing or shortness of breath.   5  . alendronate (FOSAMAX) 70 MG tablet Take 70 mg by mouth every Saturday. Take with a full glass of water on an empty stomach.    Marland Kitchen aspirin EC 81 MG tablet Take 81 mg by mouth daily.    Marland Kitchen azaTHIOprine (IMURAN) 50 MG tablet Take 100 mg by mouth 2 (two) times daily.     . BD INSULIN SYRINGE U-500 31G X 6MM 0.5 ML MISC USE TO INJECT INSULIN AS DIRECTED 100 each 2  . Blood Glucose Monitoring Suppl (ACCU-CHEK AVIVA PLUS) w/Device KIT Use to check blood sugar 4 times per day. 1 kit 2  . carvedilol (COREG) 25 MG tablet Take 25 mg by mouth 2 (two) times daily with a meal.    . cetirizine (ZYRTEC) 10 MG tablet Take 10 mg by mouth daily.    . clopidogrel (PLAVIX) 75 MG tablet Take 75 mg by mouth daily.    . clotrimazole-betamethasone (LOTRISONE) cream Apply 1 application topically daily. To feet    . cycloSPORINE (RESTASIS) 0.05 % ophthalmic emulsion Place 1 drop into both eyes 3 (three) times daily.    . famotidine (PEPCID) 20 MG tablet Take 20 mg by mouth 2 (two) times daily.    Marland Kitchen glucose blood (ACCU-CHEK AVIVA) test strip 1 each by Other route 4 (four) times daily. 150 each 5  . hydrochlorothiazide (HYDRODIURIL) 25 MG tablet Take 25 mg by mouth daily.    . hydroxychloroquine (PLAQUENIL) 200 MG tablet Take 200 mg by mouth 2 (two) times daily.     . insulin regular human CONCENTRATED (HUMULIN R) 500 UNIT/ML injection 3 times a day (just before each meal) 90-110-90 units. 20 mL 11  . megestrol (MEGACE) 40 MG tablet Take 40 mg by mouth daily.  3  . metFORMIN (GLUCOPHAGE-XR) 500 MG 24 hr tablet Take 1,000 mg by mouth 2 (two) times daily.    Marland Kitchen NUVARING 0.12-0.015 MG/24HR  vaginal ring Place 1 each vaginally every 21 ( twenty-one) days.   12  . PAZEO 0.7 % SOLN Place 1 drop into both eyes every morning.  3  . Polyvinyl Alcohol-Povidone (REFRESH OP) Place 1 drop into both eyes 2 (two) times daily.    . potassium chloride SA (K-DUR,KLOR-CON) 20 MEQ tablet Take 20 mEq by mouth 2 (two) times daily.    . pregabalin (LYRICA) 75 MG capsule Take 75 mg by mouth 2 (two) times daily.    Marland Kitchen torsemide (DEMADEX) 20 MG tablet Take 1 tablet (20 mg  total) by mouth 2 (two) times daily. 30 tablet 0  . morphine (MSIR) 15 MG tablet Take 15 mg by mouth every 12 (twelve) hours.     . [DISCONTINUED] promethazine (PHENERGAN) 25 MG tablet Take 1 tablet (25 mg total) by mouth every 8 (eight) hours as needed for nausea or vomiting. 15 tablet 0   No current facility-administered medications on file prior to visit.     Allergies  Allergen Reactions  . Metoclopramide Other (See Comments)    Developed restless leg, akathisia type limb movements.   . Oxycodone-Acetaminophen Hives and Rash    FYI: no reaction to hydrocodone; tolerates dilaudid  . Codeine Itching, Rash and Other (See Comments)    (Derm)    Family History  Problem Relation Age of Onset  . Diabetes Mother   . Breast cancer Maternal Aunt 50    currently 76  . Stomach cancer Maternal Uncle     dx 55s; deceased  . Prostate cancer Maternal Uncle     deceased 57s  . Cancer Maternal Aunt     unk. primary  . Cancer Maternal Aunt     unk. primary  . Ovarian cancer Maternal Aunt     deceased 66s  . Hypertension Other   . Stroke Other   . Arthritis Other   . Ovarian cancer Sister 68    maternal half-sister; RAD51C positive  . Cancer Paternal Aunt     unk. primary; deceased 48s  . Prostate cancer Paternal Uncle     deceased 6s  . Breast cancer Cousin     deceased 42s; daughter of mat uncle with stomach ca  . Breast cancer Maternal Aunt     deceased 37s    BP 136/82   Pulse (!) 103   Ht _0  (1.727 m)   Wt (!)  405 lb (183.7 kg)   SpO2 95%   BMI 61.58 kg/m    Review of Systems Denies LOC    Objective:   Physical Exam VITAL SIGNS:  See vs page GENERAL: no distress.  Morbid obesity.  Pulses: dorsalis pedis intact bilat.   MSK: no deformity of the feet CV: trace right leg edema, and 1+ on the left Skin:  no ulcer on the feet.  normal color and temp on the feet. Neuro: sensation is intact to touch on the feet.   Lab Results  Component Value Date   HGBA1C 6.5 09/02/2016      Assessment & Plan:  Insulin-requiring type 2 DM, with gastroparesis: well-controlled  Patient is advised the following: Patient Instructions  check your blood sugar 4 times a day: before the 3 meals, and at bedtime.  also check if you have symptoms of your blood sugar being too high or too low.  please keep a record of the readings and bring it to your next appointment here (or you can bring the meter itself).  You can write it on any piece of paper.  please call us sooner if your blood sugar goes below 70, or if you have a lot of readings over 200.  Please come back for a follow-up appointment in 4 months.   Please continue the same insulin.

## 2016-09-02 NOTE — Patient Instructions (Addendum)
check your blood sugar 4 times a day: before the 3 meals, and at bedtime.  also check if you have symptoms of your blood sugar being too high or too low.  please keep a record of the readings and bring it to your next appointment here (or you can bring the meter itself).  You can write it on any piece of paper.  please call us sooner if your blood sugar goes below 70, or if you have a lot of readings over 200.  Please come back for a follow-up appointment in 4 months.   Please continue the same insulin.

## 2016-09-13 ENCOUNTER — Ambulatory Visit (INDEPENDENT_AMBULATORY_CARE_PROVIDER_SITE_OTHER): Payer: Medicaid Other | Admitting: Podiatry

## 2016-09-13 DIAGNOSIS — M216X9 Other acquired deformities of unspecified foot: Secondary | ICD-10-CM

## 2016-09-13 DIAGNOSIS — M79671 Pain in right foot: Secondary | ICD-10-CM | POA: Diagnosis not present

## 2016-09-13 DIAGNOSIS — M21969 Unspecified acquired deformity of unspecified lower leg: Secondary | ICD-10-CM

## 2016-09-13 DIAGNOSIS — M79672 Pain in left foot: Secondary | ICD-10-CM

## 2016-09-13 DIAGNOSIS — B351 Tinea unguium: Secondary | ICD-10-CM | POA: Diagnosis not present

## 2016-09-13 DIAGNOSIS — M7741 Metatarsalgia, right foot: Secondary | ICD-10-CM

## 2016-09-13 DIAGNOSIS — L600 Ingrowing nail: Secondary | ICD-10-CM

## 2016-09-13 DIAGNOSIS — M7742 Metatarsalgia, left foot: Secondary | ICD-10-CM

## 2016-09-13 NOTE — Progress Notes (Signed)
   Lupus diagnosed in 2003, CHF since 2014 with occasional SOB and fluid build up. SUBJECTIVE: 46 y.o. year old female presents stating that bottom of both feet are dry with peeling skin, itching, and bothers her. Right great toe nail has fungus. Left foot on lateral side is tender with joint pain. Also getting muscle spasm ankle joint on both feet. She was told that she has Neuropathy.  She is diabetic since 2011. Most recent A1c last week was 6.4. Does Treadmill and Bike exercise.   REVIEW OF SYSTEMS: A comprehensive review of systems was negative except for: Diagnosed with Lupus in 2003, CHF since 2014, positive of occasional SOB and fluid build up.  OBJECTIVE: DERMATOLOGIC EXAMINATION: Mild case of peeling skin bilateral. Deformed and fungal infected right great toe with ingrown nail at medial border, symptomatic.  VASCULAR EXAMINATION OF LOWER LIMBS: All pedal pulses are palpable with normal pulsation.  Capillary Filling times within 3 seconds in all digits.  No abnormal edema or erythema noted. Temperature gradient from tibial crest to dorsum of foot is within normal bilateral.  NEUROLOGIC EXAMINATION OF THE LOWER LIMBS: Achilles DTR is present and within normal. Monofilament (Semmes-Weinstein 10-gm) sensory testing positive 6 out of 6, bilateral. Vibratory sensations(128Hz  turning fork) intact at medial and lateral forefoot bilateral.  Sharp and Dull discriminatory sensations at the plantar ball of hallux is intact bilateral.   MUSCULOSKELETAL EXAMINATION: Positive for weak first metatarsal bone bilatera. Pronation STJ with weigh bearing.  Lateral weight shifting with pain at the lesser metatarsal bones.  ASSESSMENT: Onychomycosis right great toe nail. Metatarsal deformity with hypermobile first ray bilateral. Metatarsalgia lesser, bilateral.  PLAN: Reviewed findings and available treatment options. All nails debrided. May need right great toe medial border removed.   Will benefit from custom orthotics to re balance forefoot weight bearing.

## 2016-09-13 NOTE — Patient Instructions (Signed)
Seen for painful nails and painful feet. Noted of deformed ingrown nail with fungal infection on right great toe. Also has weak first metatarsal bone that is causing the collapse of the arch. Need Custom orthotics/OTC orthotics/ and/or Diabetic shoes. All nails debrided. Need ingrown nail surgery on right great toe medial border.

## 2016-09-14 ENCOUNTER — Encounter: Payer: Self-pay | Admitting: Podiatry

## 2016-09-20 ENCOUNTER — Other Ambulatory Visit: Payer: Self-pay | Admitting: Endocrinology

## 2016-09-28 ENCOUNTER — Ambulatory Visit: Payer: Medicaid Other | Admitting: Podiatry

## 2016-10-05 ENCOUNTER — Ambulatory Visit (INDEPENDENT_AMBULATORY_CARE_PROVIDER_SITE_OTHER): Payer: Medicaid Other | Admitting: Podiatry

## 2016-10-05 DIAGNOSIS — L600 Ingrowing nail: Secondary | ICD-10-CM

## 2016-10-05 DIAGNOSIS — M79671 Pain in right foot: Secondary | ICD-10-CM

## 2016-10-05 DIAGNOSIS — S99921D Unspecified injury of right foot, subsequent encounter: Secondary | ICD-10-CM

## 2016-10-05 MED ORDER — CEPHALEXIN 500 MG PO CAPS: 500.0000 mg | ORAL_CAPSULE | Freq: Three times a day (TID) | ORAL | 0 refills | Status: DC

## 2016-10-05 NOTE — Patient Instructions (Signed)
Ingrown nail surgery done on right great toe medial border.  Keep the bandage clean and dry. Take antibiotics as prescribed. May take Tylenol or Advil for pain. Return in one week.

## 2016-10-05 NOTE — Progress Notes (Signed)
SUBJECTIVE: 46 year old female presents requesting ingrown nail surgery on deformed right great toe nail. Been having problem with the nail ever since she had it injured many years ago.  OBJECTIVE: DERMATOLOGIC EXAMINATION: Mild case of peeling skin bilateral. Deformed and fungal infected right great toe with ingrown nail at medial border, symptomatic.  VASCULAR EXAMINATION OF LOWER LIMBS: All pedal pulses are palpable with normal pulsation.   NEUROLOGIC EXAMINATION OF THE LOWER LIMBS: All epicritic and tactile sensations grossly intact.  MUSCULOSKELETAL EXAMINATION: Positive for weak first metatarsal bone bilatera. Pronation STJ with weigh bearing.  Lateral weight shifting with pain at the lesser metatarsal bones.  ASSESSMENT: Onychomycosis and ingrown right great toe nail, chronic pain.  PLAN: Reviewed findings and available treatment options. Affected right great toe was anesthetized with total 20ml mixture of 50/50 0.5% Marcaine plain and 1% Xylocaine plain. Area was cleansed with Iodine solution. Affected medial nail border and corresponding nail matrix tissue were resected. Area was well flushed. 5/0 Nylon and steri strip placed with compression bandage.  The wound was dressed with Amerigel ointment dressing. Home care instructions and supply dispensed.  Rx Keflex 500 mg tid. Return in 1 week for follow up.

## 2016-10-06 ENCOUNTER — Encounter: Payer: Self-pay | Admitting: Allergy

## 2016-10-06 ENCOUNTER — Ambulatory Visit (INDEPENDENT_AMBULATORY_CARE_PROVIDER_SITE_OTHER): Payer: Medicaid Other | Admitting: Allergy

## 2016-10-06 ENCOUNTER — Encounter: Payer: Self-pay | Admitting: Podiatry

## 2016-10-06 VITALS — BP 166/92 | HR 92 | Temp 97.8°F | Ht 68.0 in | Wt >= 6400 oz

## 2016-10-06 DIAGNOSIS — T887XXA Unspecified adverse effect of drug or medicament, initial encounter: Secondary | ICD-10-CM

## 2016-10-06 DIAGNOSIS — J309 Allergic rhinitis, unspecified: Secondary | ICD-10-CM | POA: Diagnosis not present

## 2016-10-06 DIAGNOSIS — T50905A Adverse effect of unspecified drugs, medicaments and biological substances, initial encounter: Secondary | ICD-10-CM

## 2016-10-06 DIAGNOSIS — H1010 Acute atopic conjunctivitis, unspecified eye: Secondary | ICD-10-CM | POA: Diagnosis not present

## 2016-10-06 NOTE — Patient Instructions (Addendum)
Itching and hives is a side effect associated with opiate medications.   These symptoms do not necessarily mean that you have a true drug allergy to opiate medications.    It may be reasonable to try a high-dose antihistamine regimen prior to use of opiate medications to reduce the intensity/frequency of hives and itch.    Other option is to continue avoidance of these medications you have had hives/rash with and use another appropriate option as directed by pain clinic.    For allergy symptoms will obtain serum IgE levels to assess for environmental allergies.   Continue Zyrtec daily and may trial use of nasal spray like Flonase, Nasocort or Rhinocort over-the-counter to help with runny nose symptoms.     Follow-up as needed

## 2016-10-06 NOTE — Progress Notes (Signed)
New Patient Note  RE: [name] MRN: [id number] DOB: 11-19-1970 Date of Office Visit: 10/06/2016  Referring provider: Bartholome Bill, MD Primary care provider: Bartholome Bill, MD  Chief Complaint: allergic reaction   History of present illness: [name] is a 46 y.o. female presenting today for consultation for allergic reaction.   She has a history of multiple medical problems including autoimmune disease, diabetes, obesity, obstructive sleep apnea, hypertension, reflux.  She goes to a pain clinic and she reports she has had allergic reactions to several medications.   She reports having a pre-existing allergy to oxycodone where she has developed itching and hives prior to seeing pain clinic.  She then tried hydrocodone and reports severe itching to the point that she was breaking the skin due to scratching that lead to skin infection.  With percocet she reports breaking out in hives.  She also has noted nausea and vomiting with oxycodone and hydrocodone.  With all the different opiates she reports the itching/hives come on within 5-10 minutes after taking the pain medications.  Symptoms resolve with discontinuation of the medication.   She states she did not have any issues when she used to take morphine.  She denies any hives/rash at any other time.  She also denies any worsening of her respiratory or CV related symptoms with use of these medications.    She reports she has tried ultram in the past but it did not touch her pain.    She takes zyrtec daily for allergy symptoms.  She reports symptoms of watery eyes, runny nose which are worse in the summer.   She uses restasis for her eyes as she has Sjogren syndrome.  She follows with ophthalmology every 6 months as she is on plaquenil for control of SLE.    She has history of OSA and sues CPAP.  She also has eczema for which she uses moisturization to control.  She also takes pepcid for reflux.    Review of  systems: Review of Systems  Constitutional: Negative for chills, fever, malaise/fatigue and weight loss.  HENT: Positive for congestion. Negative for ear discharge, ear pain, nosebleeds, sinus pain and sore throat.   Eyes: Negative for discharge and redness.  Respiratory: Positive for cough and shortness of breath. Negative for wheezing.   Gastrointestinal: Positive for heartburn. Negative for abdominal pain, diarrhea, nausea and vomiting.  Musculoskeletal: Positive for joint pain.  Skin: Negative for itching and rash.  Neurological: Negative for headaches.    All other systems negative unless noted above in HPI  Past medical history: Past Medical History:  Diagnosis Date  . Acute renal failure (HCC)     Intractable nausea vomiting secondary to diabetic gastroparesis causing dehydration and acute renal failure /notes 04/01/2013  . Anginal pain ([place])   . Anxiety   . Bell's palsy 08/09/2010  . CAP (community acquired pneumonia) 10/2011   Archie Endo 11/08/2011  . Chest pain 01/13/2014  . CHF (congestive heart failure) ([place])   . Diabetic gastroparesis associated with type 2 diabetes mellitus ([place])    this is presumed diagnoses, not confirmed by any studies.   . Eczema   . Family history of malignant neoplasm of breast   . Family history of malignant neoplasm of ovary   . GERD (gastroesophageal reflux disease)   . High cholesterol   . Hypertension   . Liver mass 2014   biopsied 03/2013 at [facility], not malignant.  is to undergo radiologic ablation of the  mass in sept/October 2014.   . Lupus   . Migraines    "maybe a couple times/yr" (04/01/2013)  . Obesity   . Obstructive sleep apnea on CPAP 2011  . On home oxygen therapy    "2L 24/7" (02/28/2015)  . SJOGREN'S SYNDROME 08/09/2010  . SLE (systemic lupus erythematosus) (HCC)    Hattie Perch 12/01/2010  . Stroke [facility]) 2010; 10/2012   "left side is still weak from it, never fully regained full strength; no additions from stroke 10/2012"    Past  surgical history: Past Surgical History:  Procedure Laterality Date  . BREAST CYST EXCISION Left 08/2005   epidermoid  . CARDIAC CATHETERIZATION  02/07/12  . ECTOPIC PREGNANCY SURGERY  1999  . ESOPHAGOGASTRODUODENOSCOPY N/A 02/26/2013   Procedure: ESOPHAGOGASTRODUODENOSCOPY (EGD);  Surgeon: Hilarie Fredrickson, MD;  Location: [facility] ENDOSCOPY;  Service: Endoscopy;  Laterality: N/A;  . ESOPHAGOGASTRODUODENOSCOPY N/A 03/02/2015   Procedure: ESOPHAGOGASTRODUODENOSCOPY (EGD);  Surgeon: Rachael Fee, MD;  Location: [facility] ENDOSCOPY;  Service: Endoscopy;  Laterality: N/A;  . HERNIA REPAIR    . LEFT AND RIGHT HEART CATHETERIZATION WITH CORONARY ANGIOGRAM N/A 02/07/2012   Procedure: LEFT AND RIGHT HEART CATHETERIZATION WITH CORONARY ANGIOGRAM;  Surgeon: Pamella Pert, MD;  Location: [facility] CATH LAB;  Service: Cardiovascular;  Laterality: N/A;  . LIVER BIOPSY  03/2013   liver mass/medical hx noted above  . MUSCLE BIOPSY     for lupus/notes 12/01/2010  . UMBILICAL HERNIA REPAIR  1980's    Family history:  Family History  Problem Relation Age of Onset  . Diabetes Mother   . Asthma Mother   . Urticaria Mother   . Breast cancer Maternal Aunt 60    currently 71  . Stomach cancer Maternal Uncle     dx 85s; deceased  . Prostate cancer Maternal Uncle     deceased 12s  . Cancer Maternal Aunt     unk. primary  . Cancer Maternal Aunt     unk. primary  . Ovarian cancer Maternal Aunt     deceased 42s  . Hypertension Other   . Stroke Other   . Arthritis Other   . Ovarian cancer Sister 32    maternal half-sister; RAD51C positive  . Cancer Paternal Aunt     unk. primary; deceased 23s  . Prostate cancer Paternal Uncle     deceased 91s  . Breast cancer Cousin     deceased 46s; daughter of mat uncle with stomach ca  . Breast cancer Maternal Aunt     deceased 35s  . Asthma Brother   . Asthma Brother   . Allergic rhinitis Neg Hx   . Angioedema Neg Hx     Social history: She lives in a home without carpeting  with gas heating and window cooling. There are no pets in the home. There is no concern for water damage or mildew roaches in the home. She is currently unemployed.  Social History Main Topics  . Smoking status: Former Smoker    Years: 3.00    Types: Cigarettes    Quit date: 06/01/1990  . Smokeless tobacco: Never Used    Medication List: Allergies as of 10/06/2016      Reactions   Metoclopramide Other (See Comments)   Developed restless leg, akathisia type limb movements.    Oxycodone-acetaminophen Hives, Rash   FYI: no reaction to hydrocodone; tolerates dilaudid   Codeine Itching, Rash, Other (See Comments)   (Derm)   Hydrocodone Rash   Percocet [oxycodone-acetaminophen] Hives, Rash  Medication List       Accurate as of 10/06/16 11:59 AM. Always use your most recent med list.          ACCU-CHEK AVIVA PLUS test strip Generic drug:  glucose blood CHECK BLOOD SUGAR FOUR TIMES A DAY   ACCU-CHEK AVIVA PLUS w/Device Kit Use to check blood sugar 4 times per day.   ACCU-CHEK SOFTCLIX LANCETS lancets USE TO TEST BLOOD SUGAR FOUR TIMES DAILY.   alendronate 70 MG tablet Commonly known as:  FOSAMAX Take 70 mg by mouth every Saturday. Take with a full glass of water on an empty stomach.   aspirin EC 81 MG tablet Take 81 mg by mouth daily.   azaTHIOprine 50 MG tablet Commonly known as:  IMURAN Take 100 mg by mouth 2 (two) times daily.   BD INSULIN SYRINGE U-500 31G X 6MM 0.5 ML Misc Generic drug:  Insulin Syringe/Needle U-500 USE TO INJECT INSULIN AS DIRECTED   carvedilol 25 MG tablet Commonly known as:  COREG Take 25 mg by mouth 2 (two) times daily with a meal.   cephALEXin 500 MG capsule Commonly known as:  KEFLEX Take 1 capsule (500 mg total) by mouth 3 (three) times daily.   cetirizine 10 MG tablet Commonly known as:  ZYRTEC Take 10 mg by mouth daily.   clopidogrel 75 MG tablet Commonly known as:  PLAVIX Take 75 mg by mouth daily.    clotrimazole-betamethasone cream Commonly known as:  LOTRISONE Apply 1 application topically daily. To feet   cycloSPORINE 0.05 % ophthalmic emulsion Commonly known as:  RESTASIS Place 1 drop into both eyes 3 (three) times daily.   famotidine 20 MG tablet Commonly known as:  PEPCID Take 20 mg by mouth 2 (two) times daily.   hydrochlorothiazide 25 MG tablet Commonly known as:  HYDRODIURIL Take 25 mg by mouth daily.   hydroxychloroquine 200 MG tablet Commonly known as:  PLAQUENIL Take 200 mg by mouth 2 (two) times daily.   insulin regular human CONCENTRATED 500 UNIT/ML injection Commonly known as:  HUMULIN R 3 times a day (just before each meal) 90-110-90 units.   LYRICA 75 MG capsule Generic drug:  pregabalin Take 75 mg by mouth 2 (two) times daily.   megestrol 40 MG tablet Commonly known as:  MEGACE Take 40 mg by mouth daily.   metFORMIN 500 MG 24 hr tablet Commonly known as:  GLUCOPHAGE-XR Take 1,000 mg by mouth 2 (two) times daily.   morphine 15 MG tablet Commonly known as:  MSIR Take 15 mg by mouth every 12 (twelve) hours.   NUVARING 0.12-0.015 MG/24HR vaginal ring Generic drug:  etonogestrel-ethinyl estradiol Place 1 each vaginally every 21 ( twenty-one) days.   PAZEO 0.7 % Soln Generic drug:  Olopatadine HCl Place 1 drop into both eyes every morning.   potassium chloride SA 20 MEQ tablet Commonly known as:  K-DUR,KLOR-CON Take 20 mEq by mouth 2 (two) times daily.   albuterol (5 MG/ML) 0.5% nebulizer solution Commonly known as:  PROVENTIL Take 2.5 mg by nebulization every 6 (six) hours as needed for wheezing or shortness of breath.   PROAIR HFA 108 (90 Base) MCG/ACT inhaler Generic drug:  albuterol Inhale 2 puffs into the lungs every 6 (six) hours as needed for wheezing or shortness of breath.   REFRESH OP Place 1 drop into both eyes 2 (two) times daily.   torsemide 20 MG tablet Commonly known as:  DEMADEX Take 1 tablet (20 mg total) by mouth 2  (  two) times daily.       Known medication allergies: Allergies  Allergen Reactions  . Metoclopramide Other (See Comments)    Developed restless leg, akathisia type limb movements.   . Oxycodone-Acetaminophen Hives and Rash    FYI: no reaction to hydrocodone; tolerates dilaudid  . Codeine Itching, Rash and Other (See Comments)    (Derm)  . Hydrocodone Rash  . Percocet [Oxycodone-Acetaminophen] Hives and Rash     Physical examination: Blood pressure (!) 166/92, pulse 92, temperature 97.8 F (36.6 C), temperature source Oral, height _0  (1.727 m), weight (!) 402 lb (182.3 kg).  General: Alert, interactive, in no acute distress, obese HEENT: TMs pearly gray, turbinates mildly edematous without discharge, post-pharynx non erythematous. Neck: Supple without lymphadenopathy. Lungs: Decreased breath sounds due to body habitus otherwise lungs sound clear. {no increased work of breathing. CV: Normal S1, S2 without murmurs. Abdomen: Nondistended, nontender. Skin: Skin is dry. Extremities:  No clubbing, cyanosis or edema. Neuro:   Grossly intact.  Diagnositics/Labs: None today  Assessment and plan:    Drug reaction    - She has reported development of itching and hives following several opiate medications including oxycodone, hydrocodone and Percocet. Opiates are known direct mast cell activators and can lead to pruritus and urticaria as a side effect. This does not mean that she has an IgE mediated drug allergy to these medications.  Nausea and vomiting is also a side effect of these medications. We unfortunately do not have any standardize skin testing for opiate medications and because they are direct mast cell activators it is likely that any type of skin testing would elicit a positive result rendering the test clinically insignificant.  If appropriate trial of a synthetic opioid in addition to antihistamine +\- glucocorticoid may be an option.  It also may be reasonable to try a  high-dose antihistamine regimen (ie. Zyrtec and Pepcid twice a day) +/- glucocorticoid may be another option to use with the opiates she has reacted to.  Otherwise recommend she avoid this group of medications.   Allergic rhinoconjunctivitis   - For allergy symptoms will obtain serum IgE levels to assess for environmental allergies.   Continue Zyrtec daily and may trial use of nasal spray like Flonase, Nasocort or Rhinocort over-the-counter to help with runny nose symptoms.     Follow-up [id number] as needed  I appreciate the opportunity to take part in Elsie's care. Please do not hesitate to contact me with questions.   Sincerely,   SPrudy Feeler MD Allergy/Immunology [facility]

## 2016-10-12 ENCOUNTER — Encounter: Payer: Medicaid Other | Admitting: Podiatry

## 2016-10-13 ENCOUNTER — Encounter: Payer: Self-pay | Admitting: Podiatry

## 2016-10-13 ENCOUNTER — Ambulatory Visit (INDEPENDENT_AMBULATORY_CARE_PROVIDER_SITE_OTHER): Payer: Medicaid Other | Admitting: Podiatry

## 2016-10-13 DIAGNOSIS — Z9889 Other specified postprocedural states: Secondary | ICD-10-CM

## 2016-10-13 NOTE — Progress Notes (Signed)
One week post op following right great toe excision of nail and matrix medial border. No edema but small drainage noted. Cleansed with Iodine and Iodine dressing applied. Continue daily dressing change after each shower. Finish Antibiotics.  Supply dispensed. Return in one week.

## 2016-10-13 NOTE — Patient Instructions (Addendum)
One week post op following right great toe excision of nail and matrix medial border. No edema but small drainage noted. Cleansed with Iodine and Iodine dressing applied. Continue daily dressing change after each shower. Supply dispensed. Return in one week.

## 2016-10-18 ENCOUNTER — Encounter: Payer: Self-pay | Admitting: Podiatry

## 2016-10-18 ENCOUNTER — Ambulatory Visit (INDEPENDENT_AMBULATORY_CARE_PROVIDER_SITE_OTHER): Payer: Medicaid Other | Admitting: Podiatry

## 2016-10-18 DIAGNOSIS — Z9889 Other specified postprocedural states: Secondary | ICD-10-CM

## 2016-10-18 NOTE — Patient Instructions (Signed)
2 weeks follow up on right great toe nail surgery. Wound healing is good with dry and well closed nail border. Keep the Mefix tape intact and dry after each shower. Leave the toe open without any extra wrap at night. May cover with Coban wrap during the day till tenderness subside. Return as needed.

## 2016-10-18 NOTE — Progress Notes (Signed)
2 weeks follow up on right great toe nail medial border surgery. Wound healing is good with dry and well closed nail border. Keep the Mefix tape intact and dry after each shower. Leave the toe open without any extra wrap at night. May cover with Coban wrap during the day till tenderness subside. Return as needed.

## 2016-10-22 LAB — ALLERGENS, ZONE 3
Aspergillus Fumigatus IgE: 8.01 kU/L — AB
Bahia Grass IgE: 0.28 kU/L — AB
Bermuda Grass IgE: <0.1 kU/L
Cedar, Mountain IgE: 0.11 kU/L — AB
Cladosporium Herbarum IgE: 2 kU/L — AB
Common Silver Birch IgE: <0.1 kU/L
D Farinae IgE: 3.26 kU/L — AB
D Pteronyssinus IgE: 4.36 kU/L — AB
Dog Dander IgE: 0.19 kU/L — AB
G008-IGE BLUEGRASS, KENTUCK: 0.96 kU/L — AB
Johnson Grass IgE: <0.1 kU/L
M001-IGE PENICILLIUM CHRYSOGEN: 3.33 kU/L — AB
M006-IGE ALTERNARIA ALTERNATA: 2.04 kU/L — AB
Maple/Box Elder IgE: 0.59 kU/L — AB
Mucor Racemosus IgE: 0.67 kU/L — AB
Nettle IgE: <0.1 kU/L
Pigweed, Rough IgE: 0.15 kU/L — AB
Ragweed, Short IgE: <0.1 kU/L
Stemphylium Herbarum IgE: 1.33 kU/L — AB
White Mulberry IgE: <0.1 kU/L

## 2016-10-27 ENCOUNTER — Telehealth: Payer: Self-pay | Admitting: Endocrinology

## 2016-10-27 MED ORDER — GLUCOSE BLOOD VI STRP: ORAL_STRIP | 3 refills | Status: DC

## 2016-10-27 NOTE — Telephone Encounter (Signed)
I contacted the patient. She wanted to know if her testing frequency could be increased from 4 times daily to 6 to 7 times per day? Please advise, Thanks!

## 2016-10-27 NOTE — Telephone Encounter (Signed)
I contacted the patient and notified test strips have been submitted for 7 times per day.

## 2016-10-27 NOTE — Telephone Encounter (Signed)
Patient ask you to give her a call °

## 2016-10-27 NOTE — Telephone Encounter (Signed)
Ok, I have sent a prescription to your pharmacy 

## 2016-12-30 ENCOUNTER — Encounter: Payer: Self-pay | Admitting: Endocrinology

## 2016-12-30 ENCOUNTER — Ambulatory Visit (INDEPENDENT_AMBULATORY_CARE_PROVIDER_SITE_OTHER): Payer: Medicaid Other | Admitting: Endocrinology

## 2016-12-30 VITALS — BP 148/88 | HR 90 | Ht 68.0 in | Wt >= 6400 oz

## 2016-12-30 DIAGNOSIS — E1143 Type 2 diabetes mellitus with diabetic autonomic (poly)neuropathy: Secondary | ICD-10-CM | POA: Diagnosis not present

## 2016-12-30 DIAGNOSIS — Z794 Long term (current) use of insulin: Secondary | ICD-10-CM | POA: Diagnosis not present

## 2016-12-30 LAB — POCT GLYCOSYLATED HEMOGLOBIN (HGB A1C): HEMOGLOBIN A1C: 7.6

## 2016-12-30 MED ORDER — INSULIN REGULAR HUMAN (CONC) 500 UNIT/ML ~~LOC~~ SOLN: SUBCUTANEOUS | 11 refills | Status: DC

## 2016-12-30 NOTE — Patient Instructions (Addendum)
check your blood sugar 4 times a day: before the 3 meals, and at bedtime.  also check if you have symptoms of your blood sugar being too high or too low.  please keep a record of the readings and bring it to your next appointment here (or you can bring the meter itself).  You can write it on any piece of paper.  please call us sooner if your blood sugar goes below 70, or if you have a lot of readings over 200.  Please come back for a follow-up appointment in 4 months.   Please increase the insulin to 3 times a day (just before each meal), 90-110-110 units. As the prednisone is decreased, your need for insulin will drop again, so call us if it goes low.

## 2016-12-30 NOTE — Progress Notes (Signed)
Subjective:    Patient ID: [name], female    DOB: 1971-07-23, 46 y.o.   MRN: [id number]  HPI Pt returns for f/u of diabetes mellitus: DM type: Insulin-requiring type 2 Dx'ed: 6967 Complications: polyneuropathy and gastroparesis.  Therapy: insulin (since 2014), and metformin.   GDM: never DKA: never Severe hypoglycemia: never Pancreatitis: never Other: she is too ill to undergo weight-loss surgery; she takes multiple daily injections of U-500; pt says she is not at risk for pregnancy; she has U-500 syringes.  The pattern of her cbg's indicates she does not need basal insulin.   Interval history: she resumed prednisone 1 month ago  no cbg record, but states cbg's in general highest at HS.  She says she never misses the insulin.  Past Medical History:  Diagnosis Date  . Acute renal failure (HCC)     Intractable nausea vomiting secondary to diabetic gastroparesis causing dehydration and acute renal failure /notes 04/01/2013  . Anginal pain ([place])   . Anxiety   . Bell's palsy 08/09/2010  . CAP (community acquired pneumonia) 10/2011   Archie Endo 11/08/2011  . Chest pain 01/13/2014  . CHF (congestive heart failure) ([place])   . Diabetic gastroparesis associated with type 2 diabetes mellitus ([place])    this is presumed diagnoses, not confirmed by any studies.   . Eczema   . Family history of malignant neoplasm of breast   . Family history of malignant neoplasm of ovary   . GERD (gastroesophageal reflux disease)   . High cholesterol   . Hypertension   . Liver mass 2014   biopsied 03/2013 at [facility], not malignant.  is to undergo radiologic ablation of the mass in sept/October 2014.   . Lupus   . Migraines    "maybe a couple times/yr" (04/01/2013)  . Obesity   . Obstructive sleep apnea on CPAP 2011  . On home oxygen therapy    "2L 24/7" (02/28/2015)  . SJOGREN'S SYNDROME 08/09/2010  . SLE (systemic lupus erythematosus) ([place])    Archie Endo 12/01/2010  . Stroke [facility]) 2010; 10/2012   "left side  is still weak from it, never fully regained full strength; no additions from stroke 10/2012"    Past Surgical History:  Procedure Laterality Date  . BREAST CYST EXCISION Left 08/2005   epidermoid  . CARDIAC CATHETERIZATION  02/07/12  . ECTOPIC PREGNANCY SURGERY  1999  . ESOPHAGOGASTRODUODENOSCOPY N/A 02/26/2013   Procedure: ESOPHAGOGASTRODUODENOSCOPY (EGD);  Surgeon: Irene Shipper, MD;  Location: [facility] ENDOSCOPY;  Service: Endoscopy;  Laterality: N/A;  . ESOPHAGOGASTRODUODENOSCOPY N/A 03/02/2015   Procedure: ESOPHAGOGASTRODUODENOSCOPY (EGD);  Surgeon: Milus Banister, MD;  Location: [facility];  Service: Endoscopy;  Laterality: N/A;  . HERNIA REPAIR    . LEFT AND RIGHT HEART CATHETERIZATION WITH CORONARY ANGIOGRAM N/A 02/07/2012   Procedure: LEFT AND RIGHT HEART CATHETERIZATION WITH CORONARY ANGIOGRAM;  Surgeon: Laverda Page, MD;  Location[facility] C[facility] LAB;  Service: Cardiovascular;  Laterality: N/A;  . LIVER BIOPSY  03/2013   liver mass/medical hx noted above  . MUSCLE BIOPSY     for lupus/notes 12/01/2010  . UMBILICAL HERNIA REPAIR  1980's    Social History   Social History  . Marital status: Single    Spouse name: N/A  . Number of children: N/A  . Years of education: N/A   Occupational History  .  Unemployed    student   Social History Main Topics  . Smoking status: Former Smoker    Years: 3.00  Types: Cigarettes    Quit date: 06/01/1990  . Smokeless tobacco: Never Used  . Alcohol use No  . Drug use: No  . Sexual activity: Yes    Partners: Male    Birth control/ protection: None   Other Topics Concern  . Not on file   Social History Narrative   Regular exercise-yes    Current Outpatient Prescriptions on File Prior to Visit  Medication Sig Dispense Refill  . ACCU-CHEK SOFTCLIX LANCETS lancets USE TO TEST BLOOD SUGAR FOUR TIMES DAILY. 102 each 11  . albuterol (PROAIR HFA) 108 (90 BASE) MCG/ACT inhaler Inhale 2 puffs into the lungs every 6 (six) hours as needed for  wheezing or shortness of breath.     Marland Kitchen albuterol (PROVENTIL) (5 MG/ML) 0.5% nebulizer solution Take 2.5 mg by nebulization every 6 (six) hours as needed for wheezing or shortness of breath.   5  . alendronate (FOSAMAX) 70 MG tablet Take 70 mg by mouth every Saturday. Take with a full glass of water on an empty stomach.    Marland Kitchen aspirin EC 81 MG tablet Take 81 mg by mouth daily.    Marland Kitchen azaTHIOprine (IMURAN) 50 MG tablet Take 100 mg by mouth 2 (two) times daily.     . BD INSULIN SYRINGE U-500 31G X 6MM 0.5 ML MISC USE TO INJECT INSULIN AS DIRECTED 100 each 2  . Blood Glucose Monitoring Suppl (ACCU-CHEK AVIVA PLUS) w/Device KIT Use to check blood sugar 4 times per day. 1 kit 2  . carvedilol (COREG) 25 MG tablet Take 25 mg by mouth 2 (two) times daily with a meal.    . cetirizine (ZYRTEC) 10 MG tablet Take 10 mg by mouth daily.    . clopidogrel (PLAVIX) 75 MG tablet Take 75 mg by mouth daily.    . clotrimazole-betamethasone (LOTRISONE) cream Apply 1 application topically daily. To feet    . cycloSPORINE (RESTASIS) 0.05 % ophthalmic emulsion Place 1 drop into both eyes 3 (three) times daily.    . famotidine (PEPCID) 20 MG tablet Take 20 mg by mouth 2 (two) times daily.    Marland Kitchen glucose blood (ACCU-CHEK AVIVA PLUS) test strip Check blood sugar 7 times per day 630 each 3  . hydrochlorothiazide (HYDRODIURIL) 25 MG tablet Take 25 mg by mouth daily.    . hydroxychloroquine (PLAQUENIL) 200 MG tablet Take 200 mg by mouth 2 (two) times daily.     . megestrol (MEGACE) 40 MG tablet Take 40 mg by mouth daily.  3  . metFORMIN (GLUCOPHAGE-XR) 500 MG 24 hr tablet Take 1,000 mg by mouth 2 (two) times daily.    Marland Kitchen morphine (MSIR) 15 MG tablet Take 15 mg by mouth every 12 (twelve) hours.     Marland Kitchen NUVARING 0.12-0.015 MG/24HR vaginal ring Place 1 each vaginally every 21 ( twenty-one) days.   12  . PAZEO 0.7 % SOLN Place 1 drop into both eyes every morning.  3  . Polyvinyl Alcohol-Povidone (REFRESH OP) Place 1 drop into both eyes 2  (two) times daily.    . potassium chloride SA (K-DUR,KLOR-CON) 20 MEQ tablet Take 20 mEq by mouth 2 (two) times daily.    . pregabalin (LYRICA) 75 MG capsule Take 75 mg by mouth 2 (two) times daily.    Marland Kitchen torsemide (DEMADEX) 20 MG tablet Take 1 tablet (20 mg total) by mouth 2 (two) times daily. 30 tablet 0  . cephALEXin (KEFLEX) 500 MG capsule Take 1 capsule (500 mg total) by mouth 3 (three)  times daily. (Patient not taking: Reported on 12/30/2016) 30 capsule 0  . [DISCONTINUED] promethazine (PHENERGAN) 25 MG tablet Take 1 tablet (25 mg total) by mouth every 8 (eight) hours as needed for nausea or vomiting. 15 tablet 0   No current facility-administered medications on file prior to visit.     Allergies  Allergen Reactions  . Metoclopramide Other (See Comments)    Developed restless leg, akathisia type limb movements.   . Oxycodone-Acetaminophen Hives and Rash    FYI: no reaction to hydrocodone; tolerates dilaudid  . Codeine Itching, Rash and Other (See Comments)    (Derm)  . Hydrocodone Rash  . Percocet [Oxycodone-Acetaminophen] Hives and Rash    Family History  Problem Relation Age of Onset  . Diabetes Mother   . Asthma Mother   . Urticaria Mother   . Breast cancer Maternal Aunt 4       currently 7  . Stomach cancer Maternal Uncle        dx 5s; deceased  . Prostate cancer Maternal Uncle        deceased 97s  . Cancer Maternal Aunt        unk. primary  . Cancer Maternal Aunt        unk. primary  . Ovarian cancer Maternal Aunt        deceased 67s  . Hypertension Other   . Stroke Other   . Arthritis Other   . Ovarian cancer Sister 31       maternal half-sister; RAD51C positive  . Cancer Paternal Aunt        unk. primary; deceased 35s  . Prostate cancer Paternal Uncle        deceased 53s  . Breast cancer Cousin        deceased 75s; daughter of mat uncle with stomach ca  . Breast cancer Maternal Aunt        deceased 53s  . Asthma Brother   . Asthma Brother   .  Allergic rhinitis Neg Hx   . Angioedema Neg Hx     BP (!) 148/88   Pulse 90   Ht 5' 8"  (1.727 m)   Wt (!) 408 lb (185.1 kg)   SpO2 94%   BMI 62.04 kg/m    Review of Systems She denies hypoglycemia.     Objective:   Physical Exam VITAL SIGNS:  See vs page GENERAL: no distress.  Morbid obesity.  Pulses: dorsalis pedis intact bilat.   MSK: no deformity of the feet CV: trace right leg edema, and 1+ on the left Skin:  no ulcer on the feet.  normal color and temp on the feet. Neuro: sensation is intact to touch on the feet.   Lab Results  Component Value Date   HGBA1C 7.6 12/30/2016      Assessment & Plan:  Insulin-requiring type 2 DM, with polyneuropathy: worse.  SLE: prednisone is worsening glycemic control, but she needs to take it.  HTN: recheck next time  Patient Instructions  check your blood sugar 4 times a day: before the 3 meals, and at bedtime.  also check if you have symptoms of your blood sugar being too high or too low.  please keep a record of the readings and bring it to your next appointment here (or you can bring the meter itself).  You can write it on any piece of paper.  please call us sooner if your blood sugar goes below 70, or if you have a lot of  readings over 200.  Please come back for a follow-up appointment in 4 months.   Please increase the insulin to 3 times a day (just before each meal), 90-110-110 units. As the prednisone is decreased, your need for insulin will drop again, so call us if it goes low.

## 2017-03-02 ENCOUNTER — Emergency Department (HOSPITAL_COMMUNITY)
Admission: EM | Admit: 2017-03-02 | Discharge: 2017-03-02 | Disposition: A | Discharge status: Home / Self Care | Payer: Medicaid Other | Attending: Emergency Medicine | Admitting: Emergency Medicine

## 2017-03-02 ENCOUNTER — Emergency Department (HOSPITAL_COMMUNITY): Payer: Medicaid Other

## 2017-03-02 ENCOUNTER — Encounter (HOSPITAL_COMMUNITY): Payer: Self-pay | Admitting: Emergency Medicine

## 2017-03-02 DIAGNOSIS — R0602 Shortness of breath: Secondary | ICD-10-CM | POA: Diagnosis present

## 2017-03-02 DIAGNOSIS — I5022 Chronic systolic (congestive) heart failure: Secondary | ICD-10-CM | POA: Diagnosis not present

## 2017-03-02 DIAGNOSIS — R079 Chest pain, unspecified: Secondary | ICD-10-CM | POA: Diagnosis not present

## 2017-03-02 DIAGNOSIS — Z794 Long term (current) use of insulin: Secondary | ICD-10-CM | POA: Insufficient documentation

## 2017-03-02 DIAGNOSIS — Z8673 Personal history of transient ischemic attack (TIA), and cerebral infarction without residual deficits: Secondary | ICD-10-CM | POA: Insufficient documentation

## 2017-03-02 DIAGNOSIS — Z7982 Long term (current) use of aspirin: Secondary | ICD-10-CM | POA: Insufficient documentation

## 2017-03-02 DIAGNOSIS — Z79899 Other long term (current) drug therapy: Secondary | ICD-10-CM | POA: Insufficient documentation

## 2017-03-02 DIAGNOSIS — E119 Type 2 diabetes mellitus without complications: Secondary | ICD-10-CM | POA: Diagnosis not present

## 2017-03-02 DIAGNOSIS — Z7902 Long term (current) use of antithrombotics/antiplatelets: Secondary | ICD-10-CM | POA: Diagnosis not present

## 2017-03-02 DIAGNOSIS — I110 Hypertensive heart disease with heart failure: Secondary | ICD-10-CM | POA: Diagnosis not present

## 2017-03-02 DIAGNOSIS — Z87891 Personal history of nicotine dependence: Secondary | ICD-10-CM | POA: Diagnosis not present

## 2017-03-02 LAB — CBC
HCT: 37 % (ref 36.0–46.0)
Hemoglobin: 11.9 g/dL — ABNORMAL LOW (ref 12.0–15.0)
MCH: 27.4 pg (ref 26.0–34.0)
MCHC: 32.2 g/dL (ref 30.0–36.0)
MCV: 85.3 fL (ref 78.0–100.0)
PLATELETS: 243 10*3/uL (ref 150–400)
RBC: 4.34 MIL/uL (ref 3.87–5.11)
RDW: 14.2 % (ref 11.5–15.5)
WBC: 8.9 10*3/uL (ref 4.0–10.5)

## 2017-03-02 LAB — BASIC METABOLIC PANEL
Anion gap: 8 (ref 5–15)
BUN: 8 mg/dL (ref 6–20)
CALCIUM: 8.9 mg/dL (ref 8.9–10.3)
CO2: 22 mmol/L (ref 22–32)
CREATININE: 0.85 mg/dL (ref 0.44–1.00)
Chloride: 108 mmol/L (ref 101–111)
GLUCOSE: 259 mg/dL — AB (ref 65–99)
Potassium: 3.7 mmol/L (ref 3.5–5.1)
Sodium: 138 mmol/L (ref 135–145)

## 2017-03-02 LAB — I-STAT TROPONIN, ED
TROPONIN I, POC: 0 ng/mL (ref 0.00–0.08)
Troponin i, poc: 0 ng/mL (ref 0.00–0.08)

## 2017-03-02 LAB — D-DIMER, QUANTITATIVE: D-Dimer, Quant: 0.46 ug/mL-FEU (ref 0.00–0.50)

## 2017-03-02 LAB — BRAIN NATRIURETIC PEPTIDE: B Natriuretic Peptide: 47.3 pg/mL (ref 0.0–100.0)

## 2017-03-02 MED ORDER — BACITRACIN ZINC 500 UNIT/GM EX OINT
TOPICAL_OINTMENT | Freq: Two times a day (BID) | CUTANEOUS | Status: DC
  Administered 2017-03-02: 1 via TOPICAL

## 2017-03-02 MED ORDER — MORPHINE SULFATE (PF) 4 MG/ML IV SOLN
2.0000 mg | Freq: Once | INTRAVENOUS | Status: AC
  Administered 2017-03-02: 2 mg via INTRAVENOUS
  Filled 2017-03-02: qty 1

## 2017-03-02 MED ORDER — MORPHINE SULFATE (PF) 4 MG/ML IV SOLN
4.0000 mg | Freq: Once | INTRAVENOUS | Status: AC
  Administered 2017-03-02: 4 mg via INTRAVENOUS
  Filled 2017-03-02: qty 1

## 2017-03-02 MED ORDER — ALBUTEROL SULFATE (2.5 MG/3ML) 0.083% IN NEBU
2.5000 mg | INHALATION_SOLUTION | Freq: Once | RESPIRATORY_TRACT | Status: AC
  Administered 2017-03-02: 2.5 mg via RESPIRATORY_TRACT
  Filled 2017-03-02: qty 3

## 2017-03-02 NOTE — Discharge Instructions (Signed)
You can take Tylenol or Ibuprofen as directed for pain. Take pain medication for break through pain.   Follow-up with her primary care doctor next 24-48 hours for further evaluation.  Follow-up with referred cardiologist for further appointment.  Return the emergency Department for any worsening pain, difficulty breathing, vomiting, abdominal pain, fever or any other worsening or concerning symptoms.

## 2017-03-02 NOTE — ED Triage Notes (Signed)
Onset for one week shortness of breath and chest pain. States was told to go to the ED today from primary Doctor.  Airway intact bilateral equal chest rise and fall.

## 2017-03-02 NOTE — ED Notes (Signed)
This RN attempted IV access without success. Another RN to try

## 2017-03-02 NOTE — ED Provider Notes (Signed)
[facility] DEPT Provider Note   CSN: [id number] Arrival date & time: 03/02/17  1014     History   Chief Complaint Chief Complaint  Patient presents with  . Shortness of Breath  . Chest Pain    HPI [name] is a 46 y.o. female with PMH/o Lupus, Sjogren's, HTN, HLD, morbid obesity who presents with 1 week of progressively worsening, intermittent left-sided chest pain and shortness of breath. Patient states that the chest pain feels like a "sharp pain" and is worsened with deep inspiration. Patient also reports that the shortness of breath is worsened with exertion. She notes that over the last week or so she has been progressively more short of breath when she gets up and walks to the bathroom in her house. She states that when she starts walking and doing things in her house often have to stop and take a break to catch her breath. Patient does tele monitoring  from her house, which sends her vitals to a doctor's office. Patient states that someone noticed there was an episode where her heart rate dropped. She was contacted by the doctor from the tele monitor company and prompted to go the emergency department for further workup. Patient states that if he had not been told to come to the emergency department, she likely would not come in for her symptoms. Patient reports that she has been getting very nauseous and diaphoretic. Nausea is improved after taking antiemetic. Patient reports that she has chronic bilateral lower extremity swelling and that the left is often greater than the right. She denies any recent changes, warmth or erythema to BLE. Patient denies any personal history of heart attacks. She does note that her dad had heart attack in his 65s. Patient is not a smoker. She denies any cocaine, heroine, marijuana use. Patient denies any recent fevers, headaches, dizziness, abdominal pain, vomiting, dysuria, hematuria.  The history is provided by the patient.    Past Medical  History:  Diagnosis Date  . Acute renal failure (HCC)     Intractable nausea vomiting secondary to diabetic gastroparesis causing dehydration and acute renal failure /notes 04/01/2013  . Anginal pain ([place])   . Anxiety   . Bell's palsy 08/09/2010  . CAP (community acquired pneumonia) 10/2011   Archie Endo 11/08/2011  . Chest pain 01/13/2014  . CHF (congestive heart failure) ([place])   . Diabetic gastroparesis associated with type 2 diabetes mellitus ([place])    this is presumed diagnoses, not confirmed by any studies.   . Eczema   . Family history of malignant neoplasm of breast   . Family history of malignant neoplasm of ovary   . GERD (gastroesophageal reflux disease)   . High cholesterol   . Hypertension   . Liver mass 2014   biopsied 03/2013 at [facility], not malignant.  is to undergo radiologic ablation of the mass in sept/October 2014.   . Lupus   . Migraines    "maybe a couple times/yr" (04/01/2013)  . Obesity   . Obstructive sleep apnea on CPAP 2011  . On home oxygen therapy    "2L 24/7" (02/28/2015)  . SJOGREN'S SYNDROME 08/09/2010  . SLE (systemic lupus erythematosus) ([place])    Archie Endo 12/01/2010  . Stroke [facility]) 2010; 10/2012   "left side is still weak from it, never fully regained full strength; no additions from stroke 10/2012"    Patient Active Problem List   Diagnosis Date Noted  . Diabetes ([place]) 09/19/2015  . Intractable pain 02/27/2015  .  Radiculopathy of cervical spine   . Severe headache 03/05/2014  . Family history of malignant neoplasm of breast   . Family history of malignant neoplasm of ovary   . Intractable nausea and vomiting 11/11/2013  . Septate uterus 09/08/2013  . Diabetic gastroparesis ([place]) 04/28/2013  . Constipation due to pain medication 04/21/2013  . Odynophagia 04/04/2013  . Liver mass 02/28/2013  . Reflux esophagitis 02/26/2013  . Morbid obesity ([place]) 10/10/2012  . CVA (cerebral infarction) 10/09/2012  . Chronic respiratory failure with hypoxia ([place])  10/09/2012  . Clinical polymyositis syndrome ([place]) 09/06/2012  . Mononeuritis of lower limb 01/12/2012  . Shortness of breath 11/08/2011  . HTN (hypertension) 11/08/2011  . Systolic CHF, acute on chronic ([place]) 11/08/2011  . HLD (hyperlipidemia) 07/19/2011  . Bell's palsy 08/09/2010  . SLE 08/09/2010  . [place] SYNDROME 08/09/2010    Past Surgical History:  Procedure Laterality Date  . BREAST CYST EXCISION Left 08/2005   epidermoid  . CARDIAC CATHETERIZATION  02/07/12  . ECTOPIC PREGNANCY SURGERY  1999  . ESOPHAGOGASTRODUODENOSCOPY N/A 02/26/2013   Procedure: ESOPHAGOGASTRODUODENOSCOPY (EGD);  Surgeon: Irene Shipper, MD;  Location: [facility] ENDOSCOPY;  Service: Endoscopy;  Laterality: N/A;  . ESOPHAGOGASTRODUODENOSCOPY N/A 03/02/2015   Procedure: ESOPHAGOGASTRODUODENOSCOPY (EGD);  Surgeon: Milus Banister, MD;  Location: [facility];  Service: Endoscopy;  Laterality: N/A;  . HERNIA REPAIR    . LEFT AND RIGHT HEART CATHETERIZATION WITH CORONARY ANGIOGRAM N/A 02/07/2012   Procedure: LEFT AND RIGHT HEART CATHETERIZATION WITH CORONARY ANGIOGRAM;  Surgeon: Laverda Page, MD;  Location: [facility] CATH LAB;  Service: Cardiovascular;  Laterality: N/A;  . LIVER BIOPSY  03/2013   liver mass/medical hx noted above  . MUSCLE BIOPSY     for lupus/notes 12/01/2010  . UMBILICAL HERNIA REPAIR  1980's    OB History    Gravida Para Term Preterm AB Living   2 1 1   1 1    SAB TAB Ectopic Multiple Live Births       1   1       Home Medications    Prior to Admission medications   Medication Sig Start Date End Date Taking? Authorizing Provider  ACCU-CHEK SOFTCLIX LANCETS lancets USE TO TEST BLOOD SUGAR FOUR TIMES DAILY. 08/24/16   Renato Shin, MD  albuterol (PROAIR HFA) 108 (90 BASE) MCG/ACT inhaler Inhale 2 puffs into the lungs every 6 (six) hours as needed for wheezing or shortness of breath.     [provider]  albuterol (PROVENTIL) (5 MG/ML) 0.5% nebulizer solution Take 2.5 mg by nebulization  every 6 (six) hours as needed for wheezing or shortness of breath.  06/24/14   [provider]  alendronate (FOSAMAX) 70 MG tablet Take 70 mg by mouth every Saturday. Take with a full glass of water on an empty stomach.    [provider]  aspirin EC 81 MG tablet Take 81 mg by mouth daily.    [provider]  azaTHIOprine (IMURAN) 50 MG tablet Take 100 mg by mouth 2 (two) times daily.     [provider]  BD INSULIN SYRINGE U-500 31G X 6MM 0.5 ML MISC USE TO INJECT INSULIN AS DIRECTED 08/24/16   Renato Shin, MD  Blood Glucose Monitoring Suppl (ACCU-CHEK AVIVA PLUS) w/Device KIT Use to check blood sugar 4 times per day. 01/13/16   Renato Shin, MD  carvedilol (COREG) 25 MG tablet Take 25 mg by mouth 2 (two) times daily with a meal.    [provider]  cephALEXin (KEFLEX) 500 MG capsule Take 1 capsule (500 mg total) by mouth 3 (three) times daily. Patient not taking: Reported on 12/30/2016 10/05/16   Sheard, Myeong O, DPM  cetirizine (ZYRTEC) 10 MG tablet Take 10 mg by mouth daily.    [provider]  clopidogrel (PLAVIX) 75 MG tablet Take 75 mg by mouth daily.    [provider]  clotrimazole-betamethasone (LOTRISONE) cream Apply 1 application topically daily. To feet    [provider]  cycloSPORINE (RESTASIS) 0.05 % ophthalmic emulsion Place 1 drop into both eyes 3 (three) times daily.    [provider]  famotidine (PEPCID) 20 MG tablet Take 20 mg by mouth 2 (two) times daily.    [provider]  glucose blood (ACCU-CHEK AVIVA PLUS) test strip Check blood sugar 7 times per day 10/27/16   Renato Shin, MD  hydrochlorothiazide (HYDRODIURIL) 25 MG tablet Take 25 mg by mouth daily.    [provider]  hydroxychloroquine (PLAQUENIL) 200 MG tablet Take 200 mg by mouth 2 (two) times daily.     [provider]  insulin regular human CONCENTRATED (HUMULIN R) 500 UNIT/ML injection 3 times a day (just  before each meal) 90-110-110 units. 12/30/16   Renato Shin, MD  megestrol (MEGACE) 40 MG tablet Take 40 mg by mouth daily. 01/30/15   [provider]  metFORMIN (GLUCOPHAGE-XR) 500 MG 24 hr tablet Take 1,000 mg by mouth 2 (two) times daily.    [provider]  morphine (MSIR) 15 MG tablet Take 15 mg by mouth every 12 (twelve) hours.     [provider]  NUVARING 0.12-0.015 MG/24HR vaginal ring Place 1 each vaginally every 21 ( twenty-one) days.  01/28/15   [provider]  PAZEO 0.7 % SOLN Place 1 drop into both eyes every morning. 02/11/15   [provider]  Polyvinyl Alcohol-Povidone (REFRESH OP) Place 1 drop into both eyes 2 (two) times daily.    [provider]  potassium chloride SA (K-DUR,KLOR-CON) 20 MEQ tablet Take 20 mEq by mouth 2 (two) times daily.    [provider]  pregabalin (LYRICA) 75 MG capsule Take 75 mg by mouth 2 (two) times daily. 01/28/15   [provider]  torsemide (DEMADEX) 20 MG tablet Take 1 tablet (20 mg total) by mouth 2 (two) times daily. 04/06/13   Regalado, Cassie Freer, MD    Family History Family History  Problem Relation Age of Onset  . Diabetes Mother   . Asthma Mother   . Urticaria Mother   . Breast cancer Maternal Aunt 28       currently 61  . Stomach cancer Maternal Uncle        dx 9s; deceased  . Prostate cancer Maternal Uncle        deceased 24s  . Cancer Maternal Aunt        unk. primary  . Cancer Maternal Aunt        unk. primary  . Ovarian cancer Maternal Aunt        deceased 61s  . Hypertension Other   . Stroke Other   . Arthritis Other   . Ovarian cancer Sister 26       maternal half-sister; RAD51C positive  . Cancer Paternal Aunt        unk. primary; deceased 19s  . Prostate cancer Paternal Uncle        deceased 50s  . Breast cancer Cousin  deceased 80s; daughter of mat uncle with stomach ca  . Breast cancer Maternal Aunt        deceased 73s  . Asthma Brother    . Asthma Brother   . Allergic rhinitis Neg Hx   . Angioedema Neg Hx     Social History Social History  Substance Use Topics  . Smoking status: Former Smoker    Years: 3.00    Types: Cigarettes    Quit date: 06/01/1990  . Smokeless tobacco: Never Used  . Alcohol use No     Allergies   Metoclopramide; Oxycodone-acetaminophen; Codeine; Hydrocodone; and Percocet [oxycodone-acetaminophen]   Review of Systems Review of Systems  Constitutional: Positive for diaphoresis. Negative for chills and fever.  Eyes: Negative for visual disturbance.  Respiratory: Positive for shortness of breath. Negative for cough.   Cardiovascular: Positive for chest pain and leg swelling (chronic).  Gastrointestinal: Positive for nausea. Negative for abdominal pain, diarrhea and vomiting.  Genitourinary: Negative for dysuria and hematuria.  Musculoskeletal: Negative for back pain and neck pain.  Neurological: Negative for dizziness, weakness, numbness and headaches.  All other systems reviewed and are negative.    Physical Exam Updated Vital Signs BP (!) 165/115 (BP Location: Right Arm)   Pulse 98   Temp 98.5 F (36.9 C) (Oral)   Resp 20   Ht 5' 8"  (1.727 m)   Wt (!) 165.1 kg (364 lb)   SpO2 100%   BMI 55.35 kg/m   Physical Exam  Constitutional: She is oriented to person, place, and time. She appears well-developed and well-nourished.  Appears uncomfortable but no acute distress   HENT:  Head: Normocephalic and atraumatic.  Mouth/Throat: Oropharynx is clear and moist and mucous membranes are normal.  Eyes: Pupils are equal, round, and reactive to light. Conjunctivae, EOM and lids are normal.  Neck: Full passive range of motion without pain.  Cardiovascular: Regular rhythm, normal heart sounds and normal pulses.  Tachycardia present.  Exam reveals no gallop, no distant heart sounds and no friction rub.   No murmur heard. Pulses:      Radial pulses are 2+ on the right side, and 2+ on the  left side.       Dorsalis pedis pulses are 2+ on the right side, and 2+ on the left side.  Pulmonary/Chest: Effort normal and breath sounds normal. No respiratory distress.  No evidence of respiratory distress. Able to speak in full sentences without difficulty. No tenderness to palpation of anterior chest wall  Abdominal: Soft. Normal appearance. There is no tenderness. There is no rigidity and no guarding.  Musculoskeletal: Normal range of motion.  Nonpitting edema to bilateral lower extremities, left is slightly greater than right which patient states is normal   Neurological: She is alert and oriented to person, place, and time.  Skin: Skin is warm and dry. Capillary refill takes less than 2 seconds.  Psychiatric: She has a normal mood and affect. Her speech is normal.  Nursing note and vitals reviewed.    ED Treatments / Results  Labs (all labs ordered are listed, but only abnormal results are displayed) Labs Reviewed  BASIC METABOLIC PANEL - Abnormal; Notable for the following:       Result Value   Glucose, Bld 259 (*)    All other components within normal limits  CBC - Abnormal; Notable for the following:    Hemoglobin 11.9 (*)    All other components within normal limits  BRAIN NATRIURETIC PEPTIDE  D-DIMER, QUANTITATIVE (NOT  AT [facility])  I-STAT TROPONIN, ED  I-STAT TROPONIN, ED    EKG  EKG Interpretation  Date/Time:  Thursday March 02 2017 10:18:18 EDT Ventricular Rate:  110 PR Interval:  120 QRS Duration: 86 QT Interval:  352 QTC Calculation: 476 R Axis:   36 Text Interpretation:  Sinus tachycardia with occasional Premature ventricular complexes Nonspecific ST and T wave abnormality No significant change since last tracing Confirmed by Lajean Saver [phone]) on 03/02/2017 11:21:42 AM       Radiology Dg Chest 2 View  Result Date: 03/02/2017 CLINICAL DATA:  Left-sided chest pain, tingling in the left arm, some shortness of breath EXAM: CHEST  2 VIEW COMPARISON:   Chest x-ray of 04/22/2016 FINDINGS: No active infiltrate or effusion is seen. Mediastinal and hilar contours are unremarkable. Cardiomegaly is stable. No acute bony abnormality is seen. IMPRESSION: Stable cardiomegaly.  No active lung disease. Electronically Signed   By: Ivar Drape M.D.   On: 03/02/2017 10:57    Procedures Procedures (including critical care time)  Medications Ordered in ED Medications  morphine 4 MG/ML injection 4 mg (4 mg Intravenous Given 03/02/17 1213)  albuterol (PROVENTIL) (2.5 MG/3ML) 0.083% nebulizer solution 2.5 mg (2.5 mg Nebulization Given 03/02/17 1415)  morphine 4 MG/ML injection 2 mg (2 mg Intravenous Given 03/02/17 1416)     Initial Impression / Assessment and Plan / ED Course  I have reviewed the triage vital signs and the nursing notes.  Pertinent labs & imaging results that were available during my care of the patient were reviewed by me and considered in my medical decision making (see chart for details).     46 y.o. F with complicated PMH/o who presents with 1 week of progressively worsening short of breath and chest pain. Patient told to come in by a telemedicine doctor who noticed that her heart rate had an episode where it dropped today. Patient is afebrile, non-toxic appearing. Vital signs reviewed. Patient is tachycardic and hypertensive. O2 sat is 97% on room air. Physical exam shows lungs clear to auscultation bilaterally. No evidence of distant heart sounds. Consider ACS etiology versus acute infectious etiology. Also consider PE given symptoms. Consider CHF exacerbation. History/physical exam are not concerning for pericarditis, hypertensive urgency/emergency. Initial labs and imaging ordered at triage, including CBC, BMP, troponin, EKG, chest x-ray. Will add additional BNP and d-dimer for full evaluation. Analgesics provided in the department.  Labs and imaging reviewed. CBC shows slightly low hemoglobin but otherwise unremarkable. BMP shows  hyperglycemia otherwise within normal limits. Initial troponin is negative. Chest x-ray shows cardiomegaly but stable. No other acute infectious etiology. EKG shows tach, rate 110. Evidence of premature ventricular contractions and nonspecific ST/T-wave abnormalities. Records reviewed show that is consistent with previous EKGs. D-dimer is negative. BNP is unremarkable.  Based on patient's history/presentation, she has a heart score of 4. Discussed patient with Dr. Ashok Cordia. Plan to repeat troponin. If negative, patient could potentially be discharged home.  Discussed results with patient. She reports minimal improvement with pain medication. Plan to give duo neb treatment to see if that helps with shortness breath. Will plan to repeat troponin for evaluation.  Reevaluation after patient complained DuoNeb. Patient reports that symptoms have improved slightly after DuoNeb. Patient's O2 sats are greater than 95% on room air without any difficulty. Troponin is pending.  Patient also had me take a look at an area on the left side of her neck where she had a boil previously lanced. She has a small abrasion with  no surrounding signs of infection. Will apply bacitracin to the area.  Repeat troponin is negative. Discussed results with patient. Patient has been able to tolerate by mouth in the department without any difficulty. She's been able to ambulate to the bathroom without any difficulty. Vital signs are stable. She has had no episodes of bradycardia. She is slightly hypertensive, suspect this is due to the fact she has not taken her medications. Suspect this will improve when she takes her medications at home. Patient stable for discharge at this time. Plan to have her follow-up with referred outpatient cardiology. Patient to follow-up with her primary care doctor next 24-48 hours for further evaluation.  Final Clinical Impressions(s) / ED Diagnoses   Final diagnoses:  Chest pain, unspecified type  SOB  (shortness of breath)    New Prescriptions Discharge Medication List as of 03/02/2017  4:11 PM       Volanda Napoleon, PA-C 03/02/17 2118    Lajean Saver, MD 03/04/17 1756

## 2017-03-02 NOTE — ED Notes (Signed)
Pt coming to room from xray 

## 2017-04-07 ENCOUNTER — Emergency Department (HOSPITAL_COMMUNITY)
Admission: EM | Admit: 2017-04-07 | Discharge: 2017-04-07 | Disposition: A | Discharge status: Home / Self Care | Payer: Medicaid Other | Attending: Emergency Medicine | Admitting: Emergency Medicine

## 2017-04-07 ENCOUNTER — Emergency Department (HOSPITAL_BASED_OUTPATIENT_CLINIC_OR_DEPARTMENT_OTHER): Admit: 2017-04-07 | Discharge: 2017-04-07 | Disposition: A | Discharge status: Home / Self Care | Payer: Medicaid Other

## 2017-04-07 ENCOUNTER — Encounter (HOSPITAL_COMMUNITY): Payer: Self-pay | Admitting: Emergency Medicine

## 2017-04-07 ENCOUNTER — Emergency Department (HOSPITAL_COMMUNITY): Payer: Medicaid Other

## 2017-04-07 DIAGNOSIS — I110 Hypertensive heart disease with heart failure: Secondary | ICD-10-CM | POA: Insufficient documentation

## 2017-04-07 DIAGNOSIS — Z87891 Personal history of nicotine dependence: Secondary | ICD-10-CM | POA: Diagnosis not present

## 2017-04-07 DIAGNOSIS — R0602 Shortness of breath: Secondary | ICD-10-CM | POA: Insufficient documentation

## 2017-04-07 DIAGNOSIS — Z79899 Other long term (current) drug therapy: Secondary | ICD-10-CM | POA: Insufficient documentation

## 2017-04-07 DIAGNOSIS — M79604 Pain in right leg: Secondary | ICD-10-CM | POA: Diagnosis not present

## 2017-04-07 DIAGNOSIS — Z794 Long term (current) use of insulin: Secondary | ICD-10-CM | POA: Insufficient documentation

## 2017-04-07 DIAGNOSIS — Z7902 Long term (current) use of antithrombotics/antiplatelets: Secondary | ICD-10-CM | POA: Diagnosis not present

## 2017-04-07 DIAGNOSIS — M79609 Pain in unspecified limb: Secondary | ICD-10-CM

## 2017-04-07 DIAGNOSIS — R079 Chest pain, unspecified: Secondary | ICD-10-CM | POA: Diagnosis not present

## 2017-04-07 DIAGNOSIS — I5023 Acute on chronic systolic (congestive) heart failure: Secondary | ICD-10-CM | POA: Insufficient documentation

## 2017-04-07 DIAGNOSIS — E1143 Type 2 diabetes mellitus with diabetic autonomic (poly)neuropathy: Secondary | ICD-10-CM | POA: Diagnosis not present

## 2017-04-07 DIAGNOSIS — Z7982 Long term (current) use of aspirin: Secondary | ICD-10-CM | POA: Diagnosis not present

## 2017-04-07 LAB — BASIC METABOLIC PANEL
Anion gap: 11 (ref 5–15)
BUN: 9 mg/dL (ref 6–20)
CO2: 21 mmol/L — ABNORMAL LOW (ref 22–32)
CREATININE: 0.83 mg/dL (ref 0.44–1.00)
Calcium: 8.7 mg/dL — ABNORMAL LOW (ref 8.9–10.3)
Chloride: 105 mmol/L (ref 101–111)
GFR calc Af Amer: >60 mL/min (ref >60)
GLUCOSE: 251 mg/dL — AB (ref 65–99)
Potassium: 4 mmol/L (ref 3.5–5.1)
SODIUM: 137 mmol/L (ref 135–145)

## 2017-04-07 LAB — CBC
HCT: 36.3 % (ref 36.0–46.0)
Hemoglobin: 11.8 g/dL — ABNORMAL LOW (ref 12.0–15.0)
MCH: 28 pg (ref 26.0–34.0)
MCHC: 32.5 g/dL (ref 30.0–36.0)
MCV: 86.2 fL (ref 78.0–100.0)
PLATELETS: 237 10*3/uL (ref 150–400)
RBC: 4.21 MIL/uL (ref 3.87–5.11)
RDW: 13.8 % (ref 11.5–15.5)
WBC: 9.4 10*3/uL (ref 4.0–10.5)

## 2017-04-07 LAB — I-STAT TROPONIN, ED
Troponin i, poc: 0 ng/mL (ref 0.00–0.08)
Troponin i, poc: 0 ng/mL (ref 0.00–0.08)

## 2017-04-07 LAB — D-DIMER, QUANTITATIVE: D-Dimer, Quant: 0.4 ug/mL-FEU (ref 0.00–0.50)

## 2017-04-07 LAB — BRAIN NATRIURETIC PEPTIDE: B Natriuretic Peptide: 70.1 pg/mL (ref 0.0–100.0)

## 2017-04-07 MED ORDER — KETOROLAC TROMETHAMINE 30 MG/ML IJ SOLN
30.0000 mg | Freq: Once | INTRAMUSCULAR | Status: AC
  Administered 2017-04-07: 30 mg via INTRAVENOUS
  Filled 2017-04-07: qty 1

## 2017-04-07 MED ORDER — FUROSEMIDE 10 MG/ML IJ SOLN
40.0000 mg | Freq: Once | INTRAMUSCULAR | Status: AC
  Administered 2017-04-07: 40 mg via INTRAVENOUS
  Filled 2017-04-07: qty 4

## 2017-04-07 MED ORDER — IPRATROPIUM-ALBUTEROL 0.5-2.5 (3) MG/3ML IN SOLN
3.0000 mL | Freq: Once | RESPIRATORY_TRACT | Status: AC
  Administered 2017-04-07: 3 mL via RESPIRATORY_TRACT
  Filled 2017-04-07: qty 9

## 2017-04-07 MED ORDER — FENTANYL CITRATE (PF) 100 MCG/2ML IJ SOLN
50.0000 ug | Freq: Once | INTRAMUSCULAR | Status: AC
  Administered 2017-04-07: 50 ug via INTRAVENOUS
  Filled 2017-04-07: qty 2

## 2017-04-07 MED ORDER — ONDANSETRON HCL 4 MG/2ML IJ SOLN
4.0000 mg | Freq: Once | INTRAMUSCULAR | Status: AC
  Administered 2017-04-07: 4 mg via INTRAVENOUS
  Filled 2017-04-07: qty 2

## 2017-04-07 MED ORDER — IBUPROFEN 600 MG PO TABS: 600.0000 mg | ORAL_TABLET | Freq: Four times a day (QID) | ORAL | 0 refills | Status: DC | PRN

## 2017-04-07 NOTE — ED Provider Notes (Signed)
[place] DEPT Provider Note   CSN: [id number] Arrival date & time: 04/07/17  1359     History   Chief Complaint Chief Complaint  Patient presents with  . Chest Pain  . Leg Pain    HPI [name] is a 46 y.o. female.  HPI  46 y.o. female with a hx of CHF, DM, HTN, HLD, Lupus, Sjogrens, SLE, Stroke, presents to the Emergency Department today via EMS due to upper central chest pain with onset this morning. This occurred at rest. Has been consistent since this morning. Associated nausea and shortness of breath. Notes pain is worsened with inspiration. No hx same. Notes associated left calf pain as well that occurred yesterday. Notes being unable to take home medication this morning due to calf pain (this includes her Demadex). States pain in calf is constant and rates 7/10. Throbbing sensation. No swelling. No redness. No fevers. Denies URI symptoms. Does endorse dry cough. No headaches. Pt seen on 03-02-17 in ED for same. Likely 2/2 to respiratory etiology. Neb treatments with improvement. Negative Dimer. Negative serial Troponins. Pt encouraged to follow up with Cardiology, which she did. BP medications were changed, but pt states that her home med delivery has not come yet with new prescriptions. No hx DVT/PE. Pt is sedentary mostly. Takes Plavix for stroke prophylaxis. No other symptoms noted.   Past Medical History:  Diagnosis Date  . Acute renal failure (HCC)     Intractable nausea vomiting secondary to diabetic gastroparesis causing dehydration and acute renal failure /notes 04/01/2013  . Anginal pain ([place])   . Anxiety   . Bell's palsy 08/09/2010  . CAP (community acquired pneumonia) 10/2011   Archie Endo 11/08/2011  . Chest pain 01/13/2014  . CHF (congestive heart failure) ([place])   . Diabetic gastroparesis associated with type 2 diabetes mellitus ([place])    this is presumed diagnoses, not confirmed by any studies.   . Eczema   . Family history of malignant neoplasm of breast   . Family  history of malignant neoplasm of ovary   . GERD (gastroesophageal reflux disease)   . High cholesterol   . Hypertension   . Liver mass 2014   biopsied 03/2013 at [facility], not malignant.  is to undergo radiologic ablation of the mass in sept/October 2014.   . Lupus   . Migraines    "maybe a couple times/yr" (04/01/2013)  . Obesity   . Obstructive sleep apnea on CPAP 2011  . On home oxygen therapy    "2L 24/7" (02/28/2015)  . SJOGREN'S SYNDROME 08/09/2010  . SLE (systemic lupus erythematosus) ([place])    Archie Endo 12/01/2010  . Stroke [facility]) 2010; 10/2012   "left side is still weak from it, never fully regained full strength; no additions from stroke 10/2012"    Patient Active Problem List   Diagnosis Date Noted  . Diabetes ([place]) 09/19/2015  . Intractable pain 02/27/2015  . Radiculopathy of cervical spine   . Severe headache 03/05/2014  . Family history of malignant neoplasm of breast   . Family history of malignant neoplasm of ovary   . Intractable nausea and vomiting 11/11/2013  . Septate uterus 09/08/2013  . Diabetic gastroparesis ([place]) 04/28/2013  . Constipation due to pain medication 04/21/2013  . Odynophagia 04/04/2013  . Liver mass 02/28/2013  . Reflux esophagitis 02/26/2013  . Morbid obesity ([place]) 10/10/2012  . CVA (cerebral infarction) 10/09/2012  . Chronic respiratory failure with hypoxia ([place]) 10/09/2012  . Clinical polymyositis syndrome ([place]) 09/06/2012  . Mononeuritis  of lower limb 01/12/2012  . Shortness of breath 11/08/2011  . HTN (hypertension) 11/08/2011  . Systolic CHF, acute on chronic ([place]) 11/08/2011  . HLD (hyperlipidemia) 07/19/2011  . Bell's palsy 08/09/2010  . SLE 08/09/2010  . [place] SYNDROME 08/09/2010    Past Surgical History:  Procedure Laterality Date  . BREAST CYST EXCISION Left 08/2005   epidermoid  . CARDIAC CATHETERIZATION  02/07/12  . ECTOPIC PREGNANCY SURGERY  1999  . ESOPHAGOGASTRODUODENOSCOPY N/A 02/26/2013   Procedure:  ESOPHAGOGASTRODUODENOSCOPY (EGD);  Surgeon: Irene Shipper, MD;  [facility];  Service: Endoscopy;  Laterality: N/A;  . ESOPHAGOGASTRODUODENOSCOPY N/A 03/02/2015   Procedure: ESOPHAGOGASTRODUODENOSCOPY (EGD);  Surgeon: Milus Banister, MD;  Location: [facility];  Service: Endoscopy;  Laterality: N/A;  . HERNIA REPAIR    . LEFT AND RIGHT HEART CATHETERIZATION WITH CORONARY ANGIOGRAM N/A 02/07/2012   Procedure: LEFT AND RIGHT HEART CATHETERIZATION WITH CORONARY ANGIOGRAM;  Surgeon: Laverda Page, MD;  Location: [facility] CATH LAB;  Service: Cardiovascular;  Laterality: N/A;  . LIVER BIOPSY  03/2013   liver mass/medical hx noted above  . MUSCLE BIOPSY     for lupus/notes 12/01/2010  . UMBILICAL HERNIA REPAIR  1980's    OB History    Gravida Para Term Preterm AB Living   2 1 1   1 1    SAB TAB Ectopic Multiple Live Births       1   1       Home Medications    Prior to Admission medications   Medication Sig Start Date End Date Taking? Authorizing Provider  ACCU-CHEK SOFTCLIX LANCETS lancets USE TO TEST BLOOD SUGAR FOUR TIMES DAILY. 08/24/16   Renato Shin, MD  albuterol (PROAIR HFA) 108 (90 BASE) MCG/ACT inhaler Inhale 2 puffs into the lungs every 6 (six) hours as needed for wheezing or shortness of breath.     [provider]  albuterol (PROVENTIL) (5 MG/ML) 0.5% nebulizer solution Take 2.5 mg by nebulization every 6 (six) hours as needed for wheezing or shortness of breath.  06/24/14   [provider]  alendronate (FOSAMAX) 70 MG tablet Take 70 mg by mouth every Saturday. Take with a full glass of water on an empty stomach.    [provider]  aspirin EC 81 MG tablet Take 81 mg by mouth daily.    [provider]  azaTHIOprine (IMURAN) 50 MG tablet Take 100 mg by mouth 2 (two) times daily.     [provider]  BD INSULIN SYRINGE U-500 31G X 6MM 0.5 ML MISC USE TO INJECT INSULIN AS DIRECTED 08/24/16   Renato Shin, MD  Blood Glucose Monitoring  Suppl (ACCU-CHEK AVIVA PLUS) w/Device KIT Use to check blood sugar 4 times per day. 01/13/16   Renato Shin, MD  carvedilol (COREG) 25 MG tablet Take 25 mg by mouth 2 (two) times daily with a meal.    [provider]  cephALEXin (KEFLEX) 500 MG capsule Take 1 capsule (500 mg total) by mouth 3 (three) times daily. Patient not taking: Reported on 12/30/2016 10/05/16   Sheard, Myeong O, DPM  cetirizine (ZYRTEC) 10 MG tablet Take 10 mg by mouth daily.    [provider]  clopidogrel (PLAVIX) 75 MG tablet Take 75 mg by mouth daily.    [provider]  clotrimazole-betamethasone (LOTRISONE) cream Apply 1 application topically daily. To feet    [provider]  cycloSPORINE (RESTASIS) 0.05 % ophthalmic emulsion Place 1 drop into both eyes 3 (three)  times daily.    [provider]  famotidine (PEPCID) 20 MG tablet Take 20 mg by mouth 2 (two) times daily.    [provider]  glucose blood (ACCU-CHEK AVIVA PLUS) test strip Check blood sugar 7 times per day 10/27/16   Renato Shin, MD  hydrochlorothiazide (HYDRODIURIL) 25 MG tablet Take 25 mg by mouth daily.    [provider]  hydroxychloroquine (PLAQUENIL) 200 MG tablet Take 200 mg by mouth 2 (two) times daily.     [provider]  insulin regular human CONCENTRATED (HUMULIN R) 500 UNIT/ML injection 3 times a day (just before each meal) 90-110-110 units. 12/30/16   Renato Shin, MD  megestrol (MEGACE) 40 MG tablet Take 40 mg by mouth daily. 01/30/15   [provider]  metFORMIN (GLUCOPHAGE-XR) 500 MG 24 hr tablet Take 1,000 mg by mouth 2 (two) times daily.    [provider]  morphine (MSIR) 15 MG tablet Take 15 mg by mouth every 12 (twelve) hours.     [provider]  NUVARING 0.12-0.015 MG/24HR vaginal ring Place 1 each vaginally every 21 ( twenty-one) days.  01/28/15   [provider]  PAZEO 0.7 % SOLN Place 1 drop into both eyes every morning. 02/11/15    [provider]  Polyvinyl Alcohol-Povidone (REFRESH OP) Place 1 drop into both eyes 2 (two) times daily.    [provider]  potassium chloride SA (K-DUR,KLOR-CON) 20 MEQ tablet Take 20 mEq by mouth 2 (two) times daily.    [provider]  pregabalin (LYRICA) 75 MG capsule Take 75 mg by mouth 2 (two) times daily. 01/28/15   [provider]  torsemide (DEMADEX) 20 MG tablet Take 1 tablet (20 mg total) by mouth 2 (two) times daily. 04/06/13   Regalado, Cassie Freer, MD    Family History Family History  Problem Relation Age of Onset  . Diabetes Mother   . Asthma Mother   . Urticaria Mother   . Breast cancer Maternal Aunt 68       currently 9  . Stomach cancer Maternal Uncle        dx 59s; deceased  . Prostate cancer Maternal Uncle        deceased 52s  . Cancer Maternal Aunt        unk. primary  . Cancer Maternal Aunt        unk. primary  . Ovarian cancer Maternal Aunt        deceased 61s  . Hypertension Other   . Stroke Other   . Arthritis Other   . Ovarian cancer Sister 67       maternal half-sister; RAD51C positive  . Cancer Paternal Aunt        unk. primary; deceased 45s  . Prostate cancer Paternal Uncle        deceased 11s  . Breast cancer Cousin        deceased 75s; daughter of mat uncle with stomach ca  . Breast cancer Maternal Aunt        deceased 35s  . Asthma Brother   . Asthma Brother   . Allergic rhinitis Neg Hx   . Angioedema Neg Hx     Social History Social History  Substance Use Topics  . Smoking status: Former Smoker    Years: 3.00    Types: Cigarettes    Quit date: 06/01/1990  . Smokeless tobacco: Never Used  . Alcohol use No     Allergies  Metoclopramide; Oxycodone-acetaminophen; Codeine; Hydrocodone; and Percocet [oxycodone-acetaminophen]   Review of Systems Review of Systems ROS reviewed and all are negative for acute change except as noted in the HPI.  Physical Exam Updated Vital Signs BP (!) 172/91    Pulse (!) 102   Temp 98.7 F (37.1 C) (Oral)   Resp (!) 21   Ht 5' 5"  (1.651 m)   Wt (!) 165.1 kg (364 lb)   LMP 03/24/2017   SpO2 100%   BMI 60.57 kg/m   Physical Exam  Constitutional: She is oriented to person, place, and time. Vital signs are normal. She appears well-developed and well-nourished. No distress.  Morbidly Obese. NAD. No respiratory distress.   HENT:  Head: Normocephalic and atraumatic.  Right Ear: Hearing, tympanic membrane, external ear and ear canal normal.  Left Ear: Hearing, tympanic membrane, external ear and ear canal normal.  Nose: Nose normal.  Mouth/Throat: Uvula is midline, oropharynx is clear and moist and mucous membranes are normal. No trismus in the jaw. No oropharyngeal exudate, posterior oropharyngeal erythema or tonsillar abscesses.  Eyes: Pupils are equal, round, and reactive to light. Conjunctivae and EOM are normal.  Neck: Normal range of motion. Neck supple. No tracheal deviation present.  Cardiovascular: Normal rate, regular rhythm, S1 normal, S2 normal, normal heart sounds, intact distal pulses and normal pulses.   Pulmonary/Chest: Effort normal. No respiratory distress. She has decreased breath sounds in the right lower field and the left lower field. She has no wheezes. She has no rhonchi. She has no rales.  Abdominal: Normal appearance and bowel sounds are normal. There is no tenderness.  Musculoskeletal: Normal range of motion.  Right calf TTP. No swelling. No erythema. No signs of infection. NVI. Non pitting edema bilaterally.   Neurological: She is alert and oriented to person, place, and time.  Skin: Skin is warm and dry.  Psychiatric: She has a normal mood and affect. Her speech is normal and behavior is normal. Thought content normal.   ED Treatments / Results  Labs (all labs ordered are listed, but only abnormal results are displayed) Labs Reviewed  BASIC METABOLIC PANEL - Abnormal; Notable for the following:       Result Value    CO2 21 (*)    Glucose, Bld 251 (*)    Calcium 8.7 (*)    All other components within normal limits  CBC - Abnormal; Notable for the following:    Hemoglobin 11.8 (*)    All other components within normal limits  BRAIN NATRIURETIC PEPTIDE  D-DIMER, QUANTITATIVE (NOT AT [facility])  I-STAT TROPONIN, ED  I-STAT TROPONIN, ED    EKG  EKG Interpretation None       Radiology Dg Chest 2 View  Result Date: 04/07/2017 CLINICAL DATA:  Mid chest pain with shortness of breath EXAM: CHEST  2 VIEW COMPARISON:  03/02/2017 FINDINGS: Mild cardiomegaly with central vascular congestion. No focal consolidation or effusion. No pneumothorax. IMPRESSION: Cardiomegaly with minimal central congestion. Negative for acute infiltrate or overt pulmonary edema. Electronically Signed   By: Donavan Foil M.D.   On: 04/07/2017 15:23    Procedures Procedures (including critical care time)  Medications Ordered in ED Medications  furosemide (LASIX) injection 40 mg (40 mg Intravenous Given 04/07/17 1614)  fentaNYL (SUBLIMAZE) injection 50 mcg (50 mcg Intravenous Given 04/07/17 1625)  ipratropium-albuterol (DUONEB) 0.5-2.5 (3) MG/3ML nebulizer solution 3 mL (3 mLs Nebulization Given 04/07/17 1711)  ondansetron (ZOFRAN) injection 4 mg (4 mg Intravenous Given 04/07/17 1616)  ketorolac (  TORADOL) 30 MG/ML injection 30 mg (30 mg Intravenous Given 04/07/17 1827)     Initial Impression / Assessment and Plan / ED Course  I have reviewed the triage vital signs and the nursing notes.  Pertinent labs & imaging results that were available during my care of the patient were reviewed by me and considered in my medical decision making (see chart for details).  Final Clinical Impressions(s) / ED Diagnoses  {I have reviewed and evaluated the relevant laboratory values. {I have reviewed and evaluated the relevant imaging studies. {I have interpreted the relevant EKG. {I have reviewed the relevant previous healthcare records. {I have reviewed  EMS Documentation. {I obtained HPI from historian. {Patient discussed with supervising physician.  ED Course:  Assessment: Pt is a 46 y.o. female with with a hx of CHF, DM, HTN, HLD, Lupus, Sjogrens, SLE, Stroke, presents to the Emergency Department today via EMS due to upper central chest pain with onset this morning. This occurred at rest. Has been consistent since this morning. Associated nausea and shortness of breath. Notes pain is worsened with inspiration. No hx same. Notes associated left calf pain as well that occurred yesterday. Notes being unable to take home medication this morning due to calf pain (this includes her Demadex). States pain in calf is constant and rates 7/10. Throbbing sensation. No swelling. No redness. No fevers. Denies URI symptoms. Does endorse dry cough. No headaches. Pt seen on 03-02-17 in ED for same. Likely 2/2 to respiratory etiology. Neb treatments with improvement. Negative Dimer. Negative serial Troponins. Pt encouraged to follow up with Cardiology, which she did. BP medications were changed, but pt states that her home med delivery has not come yet with new prescriptions. No hx DVT/PE. Pt is sedentary mostly. Takes Plavix for stroke prophylaxis. . On exam, pt in NAD. Nontoxic/nonseptic appearing. VSS. Appears to be persistently tachycardic. Noted on previous visit. Afebrile. Lungs CT. Heart RRR. Abdomen nontender soft. Trop unremarkable x2. EKG sinus tachycardia. CXR shows cardiomegaly with mild central chest congestion. D Dimer negative. RLE US unremarkable for DVT. BNP unremarkable. Given analgesia, duoneb in ED as well as Lasix. Noted improvement in ED. Likely 2/2 mild fluid overload. Plan is to DC home with follow up to PCP. At time of discharge, Patient is in no acute distress. Vital Signs are stable. Patient is able to ambulate. Patient able to tolerate PO.   Disposition/Plan:  DC Home Additional Verbal discharge instructions given and discussed with patient.  Pt  Instructed to f/u with PCP in the next week for evaluation and treatment of symptoms. Return precautions given Pt acknowledges and agrees with plan  Supervising Physician Charlesetta Shanks, MD  Final diagnoses:  Chest pain, unspecified type  Right leg pain    New Prescriptions New Prescriptions   No medications on file     Shary Decamp, Hershal Coria 04/07/17 1910    Charlesetta Shanks, MD 04/14/17 1030

## 2017-04-07 NOTE — ED Notes (Signed)
Got patient undress into a gown on the monitor did ekg shown to Dr Wilson Singer patient is resting with call bell in reach family at bedside

## 2017-04-07 NOTE — Discharge Instructions (Addendum)
Please read and follow all provided instructions.  Your diagnoses today include:  1. Chest pain, unspecified type   2. Right leg pain     Tests performed today include: An EKG of your heart A chest x-ray Cardiac enzymes - a blood test for heart muscle damage Blood counts and electrolytes Vital signs. See below for your results today.   Medications prescribed:   Take any prescribed medications only as directed.  Follow-up instructions: Please follow-up with your primary care provider as soon as you can for further evaluation of your symptoms.   Return instructions:  SEEK IMMEDIATE MEDICAL ATTENTION IF: You have severe chest pain, especially if the pain is crushing or pressure-like and spreads to the arms, back, neck, or jaw, or if you have sweating, nausea (feeling sick to your stomach), or shortness of breath. THIS IS AN EMERGENCY. Don't wait to see if the pain will go away. Get medical help at once. Call 911 or 0 (operator). DO NOT drive yourself to the hospital.  Your chest pain gets worse and does not go away with rest.  You have an attack of chest pain lasting longer than usual, despite rest and treatment with the medications your caregiver has prescribed.  You wake from sleep with chest pain or shortness of breath. You feel dizzy or faint. You have chest pain not typical of your usual pain for which you originally saw your caregiver.  You have any other emergent concerns regarding your health.  Additional Information: Chest pain comes from many different causes. Your caregiver has diagnosed you as having chest pain that is not specific for one problem, but does not require admission.  You are at low risk for an acute heart condition or other serious illness.   Your vital signs today were: BP 134/88    Pulse (!) 103    Temp 98.7 F (37.1 C) (Oral)    Resp 19    Ht 5\' 5"  (1.651 m)    Wt (!) 165.1 kg (364 lb)    LMP 03/24/2017    SpO2 99%    BMI 60.57 kg/m  If your blood  pressure (BP) was elevated above 135/85 this visit, please have this repeated by your doctor within one month. --------------

## 2017-04-07 NOTE — Progress Notes (Signed)
*  Preliminary Results* Right lower extremity venous duplex completed. Right lower extremity is negative for deep vein thrombosis. There is no evidence of right Baker's cyst.  04/07/2017 4:42 PM  Maudry Mayhew, BS, RVT, RDCS, RDMS

## 2017-04-07 NOTE — ED Triage Notes (Addendum)
Pt in from home via [facility] EMS with c/o upper central cp and R calf pain. Per EMS, the calf pain began yesterday and tight cp began this morning, worse when breathing in. Pt c/o nausea and sob. Given 4mg  zofran en route, 12 lead unremarkable per EMS. Hx of Lupus, DM. A&ox4, NAD

## 2017-04-07 NOTE — ED Notes (Signed)
Pt transported to vascular lab. 

## 2017-04-07 NOTE — ED Notes (Signed)
Patient transported to X-ray 

## 2017-05-01 ENCOUNTER — Ambulatory Visit: Payer: Medicaid Other | Admitting: Endocrinology

## 2017-05-11 ENCOUNTER — Other Ambulatory Visit: Payer: Self-pay | Admitting: Endocrinology

## 2017-05-15 ENCOUNTER — Ambulatory Visit (INDEPENDENT_AMBULATORY_CARE_PROVIDER_SITE_OTHER): Payer: Medicaid Other | Admitting: Endocrinology

## 2017-05-15 ENCOUNTER — Encounter: Payer: Self-pay | Admitting: Endocrinology

## 2017-05-15 VITALS — BP 162/92 | HR 105 | Wt >= 6400 oz

## 2017-05-15 DIAGNOSIS — E1143 Type 2 diabetes mellitus with diabetic autonomic (poly)neuropathy: Secondary | ICD-10-CM | POA: Diagnosis not present

## 2017-05-15 DIAGNOSIS — Z794 Long term (current) use of insulin: Secondary | ICD-10-CM | POA: Diagnosis not present

## 2017-05-15 LAB — POCT GLYCOSYLATED HEMOGLOBIN (HGB A1C): HEMOGLOBIN A1C: 9.4

## 2017-05-15 MED ORDER — DULAGLUTIDE 0.75 MG/0.5ML ~~LOC~~ SOAJ: 0.7500 mg | SUBCUTANEOUS | 11 refills | Status: DC

## 2017-05-15 NOTE — Progress Notes (Signed)
Subjective:    Patient ID: [name], female    DOB: 1970-10-29, 46 y.o.   MRN: [id number]  HPI Pt returns for f/u of diabetes mellitus: DM type: Insulin-requiring type 2 Dx'ed: 2248 Complications: polyneuropathy and gastroparesis.  Therapy: insulin (since 2014), and metformin.   GDM: never DKA: never Severe hypoglycemia: never Pancreatitis: never.  Other: she is too ill to undergo weight-loss surgery; she takes multiple daily injections of U-500; pt says she is not at risk for pregnancy; she has U-500 syringes.  The pattern of her cbg's indicates she does not need basal insulin.   Interval history: she is tapering prednisone (now 5 mg qd)  no cbg record, but states cbg's vary from 120-200's.  It is in general higher as the day goes on.  She says she never misses the insulin.   Past Medical History:  Diagnosis Date  . Acute renal failure (HCC)     Intractable nausea vomiting secondary to diabetic gastroparesis causing dehydration and acute renal failure /notes 04/01/2013  . Anginal pain ([place])   . Anxiety   . Bell's palsy 08/09/2010  . CAP (community acquired pneumonia) 10/2011   Archie Endo 11/08/2011  . Chest pain 01/13/2014  . CHF (congestive heart failure) ([place])   . Diabetic gastroparesis associated with type 2 diabetes mellitus ([place])    this is presumed diagnoses, not confirmed by any studies.   . Eczema   . Family history of malignant neoplasm of breast   . Family history of malignant neoplasm of ovary   . GERD (gastroesophageal reflux disease)   . High cholesterol   . Hypertension   . Liver mass 2014   biopsied 03/2013 at [facility], not malignant.  is to undergo radiologic ablation of the mass in sept/October 2014.   . Lupus   . Migraines    "maybe a couple times/yr" (04/01/2013)  . Obesity   . Obstructive sleep apnea on CPAP 2011  . On home oxygen therapy    "2L 24/7" (02/28/2015)  . SJOGREN'S SYNDROME 08/09/2010  . SLE (systemic lupus erythematosus) ([place])    Archie Endo  12/01/2010  . Stroke [facility]) 2010; 10/2012   "left side is still weak from it, never fully regained full strength; no additions from stroke 10/2012"    Past Surgical History:  Procedure Laterality Date  . BREAST CYST EXCISION Left 08/2005   epidermoid  . CARDIAC CATHETERIZATION  02/07/12  . ECTOPIC PREGNANCY SURGERY  1999  . ESOPHAGOGASTRODUODENOSCOPY N/A 02/26/2013   Procedure: ESOPHAGOGASTRODUODENOSCOPY (EGD);  Surgeon: Irene Shipper, MD;  Location: [facility] ENDOSCOPY;  Service: Endoscopy;  Laterality: N/A;  . ESOPHAGOGASTRODUODENOSCOPY N/A 03/02/2015   Procedure: ESOPHAGOGASTRODUODENOSCOPY (EGD);  Surgeon: Milus Banister, MD;  Location: [place];  Service: Endoscopy;  Laterality: N/A;  . HERNIA REPAIR    . LEFT AND RIGHT HEART CATHETERIZATION WITH CORONARY ANGIOGRAM N/A 02/07/2012   Procedure: LEFT AND RIGHT HEART CATHETERIZATION WITH CORONARY ANGIOGRAM;  Surgeon: Laverda Page, MD;  Location[facility] CATH LAB;  Service: Cardiovascular;  Laterality: N/A;  . LIVER BIOPSY  03/2013   liver mass/medical hx noted above  . MUSCLE BIOPSY     for lupus/notes 12/01/2010  . UMBILICAL HERNIA REPAIR  1980's    Social History   Social History  . Marital status: Single    Spouse name: N/A  . Number of children: N/A  . Years of education: N/A   Occupational History  .  Unemployed    student   Social History Main Topics  .  Smoking status: Former Smoker    Years: 3.00    Types: Cigarettes    Quit date: 06/01/1990  . Smokeless tobacco: Never Used  . Alcohol use No  . Drug use: No  . Sexual activity: Yes    Partners: Male    Birth control/ protection: None   Other Topics Concern  . Not on file   Social History Narrative   Regular exercise-yes    Current Outpatient Prescriptions on File Prior to Visit  Medication Sig Dispense Refill  . ACCU-CHEK SOFTCLIX LANCETS lancets USE TO TEST BLOOD SUGAR FOUR TIMES DAILY. 102 each 11  . albuterol (PROAIR HFA) 108 (90 BASE) MCG/ACT inhaler Inhale 2 puffs  into the lungs every 6 (six) hours as needed for wheezing or shortness of breath.     Marland Kitchen albuterol (PROVENTIL) (5 MG/ML) 0.5% nebulizer solution Take 2.5 mg by nebulization every 6 (six) hours as needed for wheezing or shortness of breath.   5  . alendronate (FOSAMAX) 70 MG tablet Take 70 mg by mouth every Saturday. Take with a full glass of water on an empty stomach.    Marland Kitchen aspirin EC 81 MG tablet Take 81 mg by mouth daily.    Marland Kitchen azaTHIOprine (IMURAN) 50 MG tablet Take 100 mg by mouth 2 (two) times daily.     . BD INSULIN SYRINGE U-500 31G X 6MM 0.5 ML MISC USE TO INJECT INSULIN AS DIRECTED 100 each 1  . Blood Glucose Monitoring Suppl (ACCU-CHEK AVIVA PLUS) w/Device KIT Use to check blood sugar 4 times per day. 1 kit 2  . buPROPion (WELLBUTRIN) 100 MG tablet Take 100 mg by mouth every morning.    . carvedilol (COREG) 25 MG tablet Take 25 mg by mouth 2 (two) times daily with a meal.    . cetirizine (ZYRTEC) 10 MG tablet Take 10 mg by mouth daily.    . clopidogrel (PLAVIX) 75 MG tablet Take 75 mg by mouth daily.    . clotrimazole-betamethasone (LOTRISONE) cream Apply 1 application topically daily. To feet    . cycloSPORINE (RESTASIS) 0.05 % ophthalmic emulsion Place 1 drop into both eyes 3 (three) times daily.    . diclofenac sodium (VOLTAREN) 1 % GEL Apply 2-4 g topically See admin instructions. TO AFFECTED AREAS TWO TIMES A DAY AS NEEDED FOR PAIN    . doxepin (SINEQUAN) 50 MG capsule Take 50-100 mg by mouth at bedtime as needed (for sleep).    . famotidine (PEPCID) 20 MG tablet Take 20 mg by mouth 2 (two) times daily.    Marland Kitchen glucose blood (ACCU-CHEK AVIVA PLUS) test strip Check blood sugar 7 times per day 630 each 3  . hydrALAZINE (APRESOLINE) 50 MG tablet Take 50 mg by mouth 3 (three) times daily.    . hydrochlorothiazide (HYDRODIURIL) 25 MG tablet Take 25 mg by mouth daily.    . hydroxychloroquine (PLAQUENIL) 200 MG tablet Take 200 mg by mouth 2 (two) times daily.     Marland Kitchen ibuprofen (ADVIL,MOTRIN) 600  MG tablet Take 1 tablet (600 mg total) by mouth every 6 (six) hours as needed. 30 tablet 0  . insulin regular human CONCENTRATED (HUMULIN R) 500 UNIT/ML injection 3 times a day (just before each meal) 90-110-110 units. (Patient taking differently: Inject 90-110 Units into the skin See admin instructions. 110 units before breakfast then 90 units before lunch then 110 units before supper) 20 mL 11  . megestrol (MEGACE) 40 MG tablet Take 40 mg by mouth daily.  3  .  metFORMIN (GLUCOPHAGE-XR) 500 MG 24 hr tablet Take 1,000 mg by mouth 2 (two) times daily.    Marland Kitchen NUVARING 0.12-0.015 MG/24HR vaginal ring Place 1 each vaginally every 28 (twenty-eight) days.   12  . PAZEO 0.7 % SOLN Place 1 drop into both eyes every morning.  3  . Polyvinyl Alcohol-Povidone (REFRESH OP) Place 1 drop into both eyes 2 (two) times daily as needed (for dry eyes).     . potassium chloride SA (K-DUR,KLOR-CON) 20 MEQ tablet Take 20 mEq by mouth 2 (two) times daily.    . pravastatin (PRAVACHOL) 10 MG tablet Take 10 mg by mouth daily.    . prednisoLONE 5 MG TABS tablet Take 5 mg by mouth daily.    . pregabalin (LYRICA) 75 MG capsule Take 75 mg by mouth 2 (two) times daily.    Marland Kitchen torsemide (DEMADEX) 20 MG tablet Take 1 tablet (20 mg total) by mouth 2 (two) times daily. 30 tablet 0  . valsartan (DIOVAN) 320 MG tablet Take 320 mg by mouth daily.    . [DISCONTINUED] promethazine (PHENERGAN) 25 MG tablet Take 1 tablet (25 mg total) by mouth every 8 (eight) hours as needed for nausea or vomiting. 15 tablet 0   No current facility-administered medications on file prior to visit.     Allergies  Allergen Reactions  . Metoclopramide Other (See Comments)    Developed restless leg, akathisia type limb movements.   . Oxycodone-Acetaminophen Hives and Rash    Tolerates Dilaudid  . Codeine Itching, Rash and Other (See Comments)  . Hydrocodone Rash  . Percocet [Oxycodone-Acetaminophen] Hives and Rash    Family History  Problem Relation Age  of Onset  . Diabetes Mother   . Asthma Mother   . Urticaria Mother   . Breast cancer Maternal Aunt 55       currently 24  . Stomach cancer Maternal Uncle        dx 12s; deceased  . Prostate cancer Maternal Uncle        deceased 72s  . Cancer Maternal Aunt        unk. primary  . Cancer Maternal Aunt        unk. primary  . Ovarian cancer Maternal Aunt        deceased 46s  . Hypertension Other   . Stroke Other   . Arthritis Other   . Ovarian cancer Sister 91       maternal half-sister; RAD51C positive  . Cancer Paternal Aunt        unk. primary; deceased 40s  . Prostate cancer Paternal Uncle        deceased 54s  . Breast cancer Cousin        deceased 4s; daughter of mat uncle with stomach ca  . Breast cancer Maternal Aunt        deceased 61s  . Asthma Brother   . Asthma Brother   . Allergic rhinitis Neg Hx   . Angioedema Neg Hx     BP (!) 162/92   Pulse (!) 105   Wt (!) 408 lb 3.2 oz (185.2 kg)   SpO2 96%   BMI 67.93 kg/m    Review of Systems She denies hypoglycemia    Objective:   Physical Exam VITAL SIGNS:  See vs page GENERAL: no distress.  Morbid obesity.  Pulses: foot pulses are intact bilaterally.   MSK: no deformity of the feet or ankles.  CV: trace bilat edema of the legs Skin:  no ulcer on the feet or ankles.  normal color and temp on the feet and ankles Neuro: sensation is intact to touch on the feet and ankles.      Lab Results  Component Value Date   CREATININE 0.83 04/07/2017   BUN 9 04/07/2017   NA 137 04/07/2017   K 4.0 04/07/2017   CL 105 04/07/2017   CO2 21 (L) 04/07/2017   Lab Results  Component Value Date   HGBA1C 9.4 05/15/2017       Assessment & Plan:  Insulin-requiring type 2 DM, with polyneuropathy: she needs increased rx.   Patient Instructions  check your blood sugar 4 times a day: before the 3 meals, and at bedtime.  also check if you have symptoms of your blood sugar being too high or too low.  please keep a record  of the readings and bring it to your next appointment here (or you can bring the meter itself).  You can write it on any piece of paper.  please call us sooner if your blood sugar goes below 70, or if you have a lot of readings over 200.  Please continue the same insulin for now, and: I have sent a prescription to your pharmacy, to add "trulicity."  If medicaid prefers Byetta, we'll have to change to that.   Please come back for a follow-up appointment in 3 months.   As the prednisone is decreased, your need for insulin will drop again, so call us if it goes low.

## 2017-05-15 NOTE — Patient Instructions (Addendum)
check your blood sugar 4 times a day: before the 3 meals, and at bedtime.  also check if you have symptoms of your blood sugar being too high or too low.  please keep a record of the readings and bring it to your next appointment here (or you can bring the meter itself).  You can write it on any piece of paper.  please call us sooner if your blood sugar goes below 70, or if you have a lot of readings over 200.  Please continue the same insulin for now, and: I have sent a prescription to your pharmacy, to add "trulicity."  If medicaid prefers Byetta, we'll have to change to that.   Please come back for a follow-up appointment in 3 months.   As the prednisone is decreased, your need for insulin will drop again, so call us if it goes low.

## 2017-05-22 ENCOUNTER — Telehealth: Payer: Self-pay | Admitting: Endocrinology

## 2017-05-22 NOTE — Telephone Encounter (Signed)
Patient is calling on the status of PA for medication Trulicity. Please advise

## 2017-05-24 NOTE — Telephone Encounter (Signed)
I called [place] [employer] & initiated a PA for trulicity.PA# [id number] call reference ID# [id number]. If PA is not approved the preferred medications are Byetta, Tanzeum or Bydureon. Also I called patient to let her know the PA had been done. I just have to call back in 24 hours to see if approved.

## 2017-05-28 ENCOUNTER — Encounter (HOSPITAL_COMMUNITY): Payer: Self-pay

## 2017-05-28 ENCOUNTER — Emergency Department (HOSPITAL_COMMUNITY): Payer: Medicaid Other

## 2017-05-28 ENCOUNTER — Emergency Department (HOSPITAL_COMMUNITY)
Admission: EM | Admit: 2017-05-28 | Discharge: 2017-05-28 | Disposition: A | Discharge status: Home / Self Care | Payer: Medicaid Other | Attending: Emergency Medicine | Admitting: Emergency Medicine

## 2017-05-28 DIAGNOSIS — E119 Type 2 diabetes mellitus without complications: Secondary | ICD-10-CM | POA: Insufficient documentation

## 2017-05-28 DIAGNOSIS — I110 Hypertensive heart disease with heart failure: Secondary | ICD-10-CM | POA: Diagnosis not present

## 2017-05-28 DIAGNOSIS — I5022 Chronic systolic (congestive) heart failure: Secondary | ICD-10-CM | POA: Diagnosis not present

## 2017-05-28 DIAGNOSIS — R05 Cough: Secondary | ICD-10-CM | POA: Diagnosis not present

## 2017-05-28 DIAGNOSIS — Z794 Long term (current) use of insulin: Secondary | ICD-10-CM | POA: Diagnosis not present

## 2017-05-28 DIAGNOSIS — Z87891 Personal history of nicotine dependence: Secondary | ICD-10-CM | POA: Insufficient documentation

## 2017-05-28 DIAGNOSIS — M25561 Pain in right knee: Secondary | ICD-10-CM

## 2017-05-28 DIAGNOSIS — R059 Cough, unspecified: Secondary | ICD-10-CM

## 2017-05-28 DIAGNOSIS — Z79899 Other long term (current) drug therapy: Secondary | ICD-10-CM | POA: Diagnosis not present

## 2017-05-28 DIAGNOSIS — Z7982 Long term (current) use of aspirin: Secondary | ICD-10-CM | POA: Diagnosis not present

## 2017-05-28 DIAGNOSIS — Z7901 Long term (current) use of anticoagulants: Secondary | ICD-10-CM | POA: Insufficient documentation

## 2017-05-28 LAB — URINALYSIS, ROUTINE W REFLEX MICROSCOPIC
BACTERIA UA: NONE SEEN
Bilirubin Urine: NEGATIVE
Glucose, UA: >=500 mg/dL — AB
KETONES UR: NEGATIVE mg/dL
Nitrite: NEGATIVE
PROTEIN: NEGATIVE mg/dL
Specific Gravity, Urine: 1.026 (ref 1.005–1.030)
pH: 6 (ref 5.0–8.0)

## 2017-05-28 LAB — BASIC METABOLIC PANEL
Anion gap: 9 (ref 5–15)
BUN: 7 mg/dL (ref 6–20)
CHLORIDE: 104 mmol/L (ref 101–111)
CO2: 24 mmol/L (ref 22–32)
CREATININE: 0.8 mg/dL (ref 0.44–1.00)
Calcium: 8.8 mg/dL — ABNORMAL LOW (ref 8.9–10.3)
GLUCOSE: 277 mg/dL — AB (ref 65–99)
POTASSIUM: 3.7 mmol/L (ref 3.5–5.1)
Sodium: 137 mmol/L (ref 135–145)

## 2017-05-28 LAB — CBC
HEMATOCRIT: 36.4 % (ref 36.0–46.0)
Hemoglobin: 11.7 g/dL — ABNORMAL LOW (ref 12.0–15.0)
MCH: 28.1 pg (ref 26.0–34.0)
MCHC: 32.1 g/dL (ref 30.0–36.0)
MCV: 87.5 fL (ref 78.0–100.0)
PLATELETS: 285 10*3/uL (ref 150–400)
RBC: 4.16 MIL/uL (ref 3.87–5.11)
RDW: 14.2 % (ref 11.5–15.5)
WBC: 11.4 10*3/uL — AB (ref 4.0–10.5)

## 2017-05-28 LAB — I-STAT TROPONIN, ED: Troponin i, poc: 0 ng/mL (ref 0.00–0.08)

## 2017-05-28 LAB — BRAIN NATRIURETIC PEPTIDE: B Natriuretic Peptide: 205 pg/mL — ABNORMAL HIGH (ref 0.0–100.0)

## 2017-05-28 MED ORDER — ONDANSETRON HCL 4 MG PO TABS
4.0000 mg | ORAL_TABLET | Freq: Once | ORAL | Status: AC
  Administered 2017-05-28: 4 mg via ORAL
  Filled 2017-05-28: qty 1

## 2017-05-28 MED ORDER — LIDOCAINE 5 % EX PTCH: 1.0000 | MEDICATED_PATCH | CUTANEOUS | 0 refills | Status: DC

## 2017-05-28 MED ORDER — MORPHINE SULFATE 15 MG PO TABS
15.0000 mg | ORAL_TABLET | ORAL | Status: DC | PRN
  Administered 2017-05-28: 15 mg via ORAL
  Filled 2017-05-28: qty 1

## 2017-05-28 NOTE — ED Notes (Signed)
RN walked into room pt was eating food from [place]. Pt then complains of nausea

## 2017-05-28 NOTE — ED Notes (Signed)
Patient transported to X-ray 

## 2017-05-28 NOTE — ED Notes (Signed)
Pt aware of need for urine  

## 2017-05-28 NOTE — ED Triage Notes (Signed)
Patient complains of chest tightness and shortness of breath for a few days. Also complains of ongoing right knee pain with swelling to same after a fall 1 week ago. Speaking full sentences, NAD

## 2017-05-28 NOTE — Discharge Instructions (Signed)
Use lidocaine patches as needed for pain. Use Ace wrap as needed for support.  Do not wear this at night. Continue to use ice packs as needed for pain and swelling. Follow-up with the orthopedic doctor for further evaluation of your knee. Continue to take all your at home medications. Follow-up with either your cardiologist or primary care doctor for evaluation of your chest tightness and cough. Return to the emergency room if you develop fevers, persistent chest pain, constant chest tightness, worsening shortness of breath, or any new or worsening symptoms.

## 2017-05-28 NOTE — ED Notes (Signed)
Pt stated that she was experiencing pain in her knee, after x-ray. Pt also stated that she is still nauseous. Informed Dr. Alvino Chapel.

## 2017-05-28 NOTE — ED Notes (Signed)
Pt requesting pain meds

## 2017-05-28 NOTE — ED Provider Notes (Signed)
[facility] EMERGENCY DEPARTMENT Provider Note   CSN: [id number] Arrival date & time: 05/28/17  1040     History   Chief Complaint Chief Complaint  Patient presents with  . chest tightness/knee pain    HPI [name] is a 46 y.o. female presenting with chest tightness and right knee pain.  Patient with a past medical history significant for CHF, diabetes, hypertension, HLD, lupus, Sjogren's, SLE, and stroke. Patient states she is experiencing chest tightness when she is coughing.  Cough has been persistent for about a month.  She reports shortness of breath, but this has not been worse recently.  She denies fevers, chills, chest pain, nausea, vomiting, abdominal pain.  She reports increased urinary frequency.  She denies dysuria or hematuria.  She reports she has a history of CHF, is taking Lasix as prescribed.  Despite this, she has not had improvement of her leg swelling.  She states she follows with Dr. Einar Gip from cardiology, and her last echo showed an EF of 40-50.  She denies sick contacts.  She denies history of DVT or PE the, takes Plavix for stroke prevention.  She denies history of MI or CAD, father with a heart attack in his 84s.  Patient states that her sister died recently, and her funeral was 3 days ago. Additionally, patient states that she fell 2 weeks ago and had acute onset right knee pain.  Pain is anterior.  The pain is worse when she walks or with palpation.  She is tried Voltaren gel without relief of pain.  Patient states she was told not to take ibuprofen or Tylenol by her lupus doctor, but she does not know why.  She denies new numbness or tingling of the foot or leg.   HPI  Past Medical History:  Diagnosis Date  . Acute renal failure (HCC)     Intractable nausea vomiting secondary to diabetic gastroparesis causing dehydration and acute renal failure /notes 04/01/2013  . Anginal pain ([place])   . Anxiety   . Bell's palsy 08/09/2010  . CAP (community  acquired pneumonia) 10/2011   Archie Endo 11/08/2011  . Chest pain 01/13/2014  . CHF (congestive heart failure) ([place])   . Diabetic gastroparesis associated with type 2 diabetes mellitus ([place])    this is presumed diagnoses, not confirmed by any studies.   . Eczema   . Family history of malignant neoplasm of breast   . Family history of malignant neoplasm of ovary   . GERD (gastroesophageal reflux disease)   . High cholesterol   . Hypertension   . Liver mass 2014   biopsied 03/2013 at [facility], not malignant.  is to undergo radiologic ablation of the mass in sept/October 2014.   . Lupus   . Migraines    "maybe a couple times/yr" (04/01/2013)  . Obesity   . Obstructive sleep apnea on CPAP 2011  . On home oxygen therapy    "2L 24/7" (02/28/2015)  . SJOGREN'S SYNDROME 08/09/2010  . SLE (systemic lupus erythematosus) ([place])    Archie Endo 12/01/2010  . Stroke [facility]) 2010; 10/2012   "left side is still weak from it, never fully regained full strength; no additions from stroke 10/2012"    Patient Active Problem List   Diagnosis Date Noted  . Diabetes ([place]) 09/19/2015  . Intractable pain 02/27/2015  . Radiculopathy of cervical spine   . Severe headache 03/05/2014  . Family history of malignant neoplasm of breast   . Family history of malignant neoplasm  of ovary   . Intractable nausea and vomiting 11/11/2013  . Septate uterus 09/08/2013  . Diabetic gastroparesis ([place]) 04/28/2013  . Constipation due to pain medication 04/21/2013  . Odynophagia 04/04/2013  . Liver mass 02/28/2013  . Reflux esophagitis 02/26/2013  . Morbid obesity ([place]) 10/10/2012  . CVA (cerebral infarction) 10/09/2012  . Chronic respiratory failure with hypoxia ([place]) 10/09/2012  . Clinical polymyositis syndrome ([place]) 09/06/2012  . Mononeuritis of lower limb 01/12/2012  . Shortness of breath 11/08/2011  . HTN (hypertension) 11/08/2011  . Systolic CHF, acute on chronic ([employer]) 11/08/2011  . HLD (hyperlipidemia) 07/19/2011  .  Bell's palsy 08/09/2010  . SLE 08/09/2010  . [place] SYNDROME 08/09/2010    Past Surgical History:  Procedure Laterality Date  . BREAST CYST EXCISION Left 08/2005   epidermoid  . CARDIAC CATHETERIZATION  02/07/12  . ECTOPIC PREGNANCY SURGERY  1999  . ESOPHAGOGASTRODUODENOSCOPY N/A 02/26/2013   Procedure: ESOPHAGOGASTRODUODENOSCOPY (EGD);  Surgeon: Irene Shipper, MD;  Location: [facility] ENDOSCOPY;  Service: Endoscopy;  Laterality: N/A;  . ESOPHAGOGASTRODUODENOSCOPY N/A 03/02/2015   Procedure: ESOPHAGOGASTRODUODENOSCOPY (EGD);  Surgeon: Milus Banister, MD;  Location: [place];  Service: Endoscopy;  Laterality: N/A;  . HERNIA REPAIR    . LEFT AND RIGHT HEART CATHETERIZATION WITH CORONARY ANGIOGRAM N/A 02/07/2012   Procedure: LEFT AND RIGHT HEART CATHETERIZATION WITH CORONARY ANGIOGRAM;  Surgeon: Laverda Page, MD;  Location: [facility] CATH LAB;  Service: Cardiovascular;  Laterality: N/A;  . LIVER BIOPSY  03/2013   liver mass/medical hx noted above  . MUSCLE BIOPSY     for lupus/notes 12/01/2010  . UMBILICAL HERNIA REPAIR  1980's    OB History    Gravida Para Term Preterm AB Living   2 1 1   1 1    SAB TAB Ectopic Multiple Live Births       1   1       Home Medications    Prior to Admission medications   Medication Sig Start Date End Date Taking? Authorizing Provider  ACCU-CHEK SOFTCLIX LANCETS lancets USE TO TEST BLOOD SUGAR FOUR TIMES DAILY. 08/24/16   Renato Shin, MD  albuterol (PROAIR HFA) 108 (90 BASE) MCG/ACT inhaler Inhale 2 puffs into the lungs every 6 (six) hours as needed for wheezing or shortness of breath.     [provider]  albuterol (PROVENTIL) (5 MG/ML) 0.5% nebulizer solution Take 2.5 mg by nebulization every 6 (six) hours as needed for wheezing or shortness of breath.  06/24/14   [provider]  alendronate (FOSAMAX) 70 MG tablet Take 70 mg by mouth every Saturday. Take with a full glass of water on an empty stomach.    [provider]  aspirin  EC 81 MG tablet Take 81 mg by mouth daily.    [provider]  azaTHIOprine (IMURAN) 50 MG tablet Take 100 mg by mouth 2 (two) times daily.     [provider]  BD INSULIN SYRINGE U-500 31G X 6MM 0.5 ML MISC USE TO INJECT INSULIN AS DIRECTED 05/12/17   Renato Shin, MD  Blood Glucose Monitoring Suppl (ACCU-CHEK AVIVA PLUS) w/Device KIT Use to check blood sugar 4 times per day. 01/13/16   Renato Shin, MD  buPROPion [facility]) 100 MG tablet Take 100 mg by mouth every morning. 10/20/16   [provider]  carvedilol (COREG) 25 MG tablet Take 25 mg by mouth 2 (two) times daily with a meal.    [provider]  cetirizine (ZYRTEC) 10 MG tablet  Take 10 mg by mouth daily.    [provider]  clopidogrel (PLAVIX) 75 MG tablet Take 75 mg by mouth daily.    [provider]  clotrimazole-betamethasone (LOTRISONE) cream Apply 1 application topically daily. To feet    [provider]  cycloSPORINE (RESTASIS) 0.05 % ophthalmic emulsion Place 1 drop into both eyes 3 (three) times daily.    [provider]  diclofenac sodium (VOLTAREN) 1 % GEL Apply 2-4 g topically See admin instructions. TO AFFECTED AREAS TWO TIMES A DAY AS NEEDED FOR PAIN    [provider]  doxepin (SINEQUAN) 50 MG capsule Take 50-100 mg by mouth at bedtime as needed (for sleep).    [provider]  Dulaglutide (TRULICITY) 5.85 ID/7.8EU SOPN Inject 0.75 mg into the skin once a week. 05/15/17   Renato Shin, MD  famotidine (PEPCID) 20 MG tablet Take 20 mg by mouth 2 (two) times daily.    [provider]  glucose blood (ACCU-CHEK AVIVA PLUS) test strip Check blood sugar 7 times per day 10/27/16   Renato Shin, MD  hydrALAZINE (APRESOLINE) 50 MG tablet Take 50 mg by mouth 3 (three) times daily.    [provider]  hydrochlorothiazide (HYDRODIURIL) 25 MG tablet Take 25 mg by mouth daily.    [provider]  hydroxychloroquine  (PLAQUENIL) 200 MG tablet Take 200 mg by mouth 2 (two) times daily.     [provider]  ibuprofen (ADVIL,MOTRIN) 600 MG tablet Take 1 tablet (600 mg total) by mouth every 6 (six) hours as needed. 04/07/17   Shary Decamp, PA-C  insulin regular human CONCENTRATED (HUMULIN R) 500 UNIT/ML injection 3 times a day (just before each meal) 90-110-110 units. Patient taking differently: Inject 90-110 Units into the skin See admin instructions. 110 units before breakfast then 90 units before lunch then 110 units before supper 12/30/16   Renato Shin, MD  lidocaine (LIDODERM) 5 % Place 1 patch onto the skin daily. Remove & Discard patch within 12 hours or as directed by MD 05/28/17   Marili Vader, PA-C  megestrol (MEGACE) 40 MG tablet Take 40 mg by mouth daily. 01/30/15   [provider]  metFORMIN (GLUCOPHAGE-XR) 500 MG 24 hr tablet Take 1,000 mg by mouth 2 (two) times daily.    [provider]  NUVARING 0.12-0.015 MG/24HR vaginal ring Place 1 each vaginally every 28 (twenty-eight) days.  01/28/15   [provider]  PAZEO 0.7 % SOLN Place 1 drop into both eyes every morning. 02/11/15   [provider]  Polyvinyl Alcohol-Povidone (REFRESH OP) Place 1 drop into both eyes 2 (two) times daily as needed (for dry eyes).     [provider]  potassium chloride SA (K-DUR,KLOR-CON) 20 MEQ tablet Take 20 mEq by mouth 2 (two) times daily.    [provider]  pravastatin (PRAVACHOL) 10 MG tablet Take 10 mg by mouth daily.    [provider]  prednisoLONE 5 MG TABS tablet Take 5 mg by mouth daily.    [provider]  pregabalin (LYRICA) 75 MG capsule Take 75 mg by mouth 2 (two) times daily. 01/28/15   [provider]  torsemide (DEMADEX) 20 MG tablet Take 1 tablet (20 mg total) by mouth 2 (two) times daily. 04/06/13   Regalado, Belkys A, MD  valsartan (DIOVAN) 320 MG tablet Take 320 mg by mouth daily.    [provider]    Family  History Family History  Problem Relation Age  of Onset  . Diabetes Mother   . Asthma Mother   . Urticaria Mother   . Breast cancer Maternal Aunt 52       currently 82  . Stomach cancer Maternal Uncle        dx 15s; deceased  . Prostate cancer Maternal Uncle        deceased 66s  . Cancer Maternal Aunt        unk. primary  . Cancer Maternal Aunt        unk. primary  . Ovarian cancer Maternal Aunt        deceased 39s  . Hypertension Other   . Stroke Other   . Arthritis Other   . Ovarian cancer Sister 32       maternal half-sister; RAD51C positive  . Cancer Paternal Aunt        unk. primary; deceased 73s  . Prostate cancer Paternal Uncle        deceased 25s  . Breast cancer Cousin        deceased 42s; daughter of mat uncle with stomach ca  . Breast cancer Maternal Aunt        deceased 22s  . Asthma Brother   . Asthma Brother   . Allergic rhinitis Neg Hx   . Angioedema Neg Hx     Social History Social History  Substance Use Topics  . Smoking status: Former Smoker    Years: 3.00    Types: Cigarettes    Quit date: 06/01/1990  . Smokeless tobacco: Never Used  . Alcohol use No     Allergies   Metoclopramide; Oxycodone-acetaminophen; Codeine; Hydrocodone; and Percocet [oxycodone-acetaminophen]   Review of Systems Review of Systems  Respiratory: Positive for cough, chest tightness and shortness of breath (unchanged from baseline).   Musculoskeletal: Positive for arthralgias.  All other systems reviewed and are negative.    Physical Exam Updated Vital Signs BP 91/61   Pulse 99   Temp 98 F (36.7 C) (Oral)   Resp (!) 24   SpO2 95%   Physical Exam  Constitutional: She is oriented to person, place, and time. She appears well-developed and well-nourished. No distress.  HENT:  Head: Normocephalic and atraumatic.  Eyes: EOM are normal.  Neck: Normal range of motion.  Cardiovascular: Normal rate, regular rhythm and intact distal pulses.   Pulmonary/Chest:  Effort normal and breath sounds normal. No respiratory distress. She has no decreased breath sounds. She has no wheezes. She has no rhonchi. She exhibits tenderness.  Tenderness to palpation of the right sided chest wall.  Abdominal: Soft. Bowel sounds are normal. She exhibits no distension. There is no tenderness.  Morbidly obese  Musculoskeletal: She exhibits tenderness.  Tenderness to palpation of the anterior R knee.  No obvious swelling, deformity, or injury.  Pedal pulses intact bilaterally.  Bilateral lower leg swelling, left worse than right (baseline).  No tenderness of the calves.  Sensation intact bilaterally.  Strength intact bilaterally.  Patient ambulatory with pain.  Neurological: She is alert and oriented to person, place, and time. No sensory deficit.  Skin: Skin is warm and dry.  Psychiatric: She has a normal mood and affect.  Nursing note and vitals reviewed.    ED Treatments / Results  Labs (all labs ordered are listed, but only abnormal results are displayed) Labs Reviewed  BASIC METABOLIC PANEL - Abnormal; Notable for the following:       Result Value   Glucose, Bld 277 (*)  Calcium 8.8 (*)    All other components within normal limits  CBC - Abnormal; Notable for the following:    WBC 11.4 (*)    Hemoglobin 11.7 (*)    All other components within normal limits  BRAIN NATRIURETIC PEPTIDE - Abnormal; Notable for the following:    B Natriuretic Peptide 205.0 (*)    All other components within normal limits  URINALYSIS, ROUTINE W REFLEX MICROSCOPIC - Abnormal; Notable for the following:    Glucose, UA >=500 (*)    Hgb urine dipstick MODERATE (*)    Leukocytes, UA MODERATE (*)    Squamous Epithelial / LPF 0-5 (*)    All other components within normal limits  I-STAT TROPONIN, ED    EKG  EKG Interpretation None       Radiology Dg Chest 2 View  Result Date: 05/28/2017 CLINICAL DATA:  Midsternal chest pain, shortness of breath and worsen cough for 2  weeks. Ex-smoker. Diabetes, CHF and hypertension. EXAM: CHEST  2 VIEW COMPARISON:  Chest x-rays dated 04/07/2017, 03/02/2017 and 04/22/2016. FINDINGS: Stable cardiomegaly. Overall cardiomediastinal silhouette is stable. Lungs remain clear. No pleural effusion or pneumothorax seen. Osseous structures about the chest are unremarkable. IMPRESSION: Stable cardiomegaly. No active cardiopulmonary disease. No evidence of pneumonia or pulmonary edema. Electronically Signed   By: Franki Cabot M.D.   On: 05/28/2017 11:26   Dg Knee Complete 4 Views Right  Result Date: 05/28/2017 CLINICAL DATA:  Right knee pain after fall 2 weeks ago; Pain is mostly medial EXAM: RIGHT KNEE - COMPLETE 4+ VIEW COMPARISON:  None. FINDINGS: No evidence of fracture, dislocation, or joint effusion. No evidence of arthropathy or other focal bone abnormality. Soft tissues are unremarkable. IMPRESSION: Negative. Electronically Signed   By: Franki Cabot M.D.   On: 05/28/2017 13:32    Procedures Procedures (including critical care time)  Medications Ordered in ED Medications  morphine (MSIR) tablet 15 mg (15 mg Oral Given 05/28/17 1416)  ondansetron (ZOFRAN) tablet 4 mg (4 mg Oral Given 05/28/17 1206)     Initial Impression / Assessment and Plan / ED Course  I have reviewed the triage vital signs and the nursing notes.  Pertinent labs & imaging results that were available during my care of the patient were reviewed by me and considered in my medical decision making (see chart for details).     Patient presenting with chest tightness when she coughs times 1 month.  No increased shortness of breath.  Physical exam reassuring, she is afebrile.  Slight tachycardia, improved throughout visit.  No new leg swelling.  Has been evaluated for PE multiple times in the past, negative each time.  She will labs show negative troponin, CBC reassuring, BMP reassuring.  Slight elevation in BNP to 205.  UA negative for infection.  EKG reassuring.   Chest x-ray without fluid overload.  Clinically, patient does not appear fluid overloaded.  Discussed case with attending, Dr. Alvino Chapel agrees to plan.  At this time, doubt symptoms are due to new cardiac or pulmonary cause.  Likely due to patient's body habitus and CHF.  Sats remain in the upper 90s.  Patient appears safe to follow-up with primary care or cardiologist. Additionally, patient with right knee pain after fall 2 weeks ago.  X-ray negative for fracture or dislocation.  Will treat with Lidoderm patches and Ace wrap.  Patient to follow-up with orthopedics as needed.  Neurovascularly intact with soft compartments.  At this time, patient appears after discharge.  Return precautions  given.  Patient states she understands and agrees to plan.   Final Clinical Impressions(s) / ED Diagnoses   Final diagnoses:  Acute pain of right knee  Cough    New Prescriptions Discharge Medication List as of 05/28/2017  2:33 PM    START taking these medications   Details  lidocaine (LIDODERM) 5 % Place 1 patch onto the skin daily. Remove & Discard patch within 12 hours or as directed by MD, Starting Sun 05/28/2017, Print         Charlott Calvario, PA-C 05/28/17 1629    Davonna Belling, MD 05/29/17 2146

## 2017-05-30 MED ORDER — EXENATIDE 5 MCG/0.02ML ~~LOC~~ SOPN: 5.0000 ug | PEN_INJECTOR | Freq: Two times a day (BID) | SUBCUTANEOUS | 11 refills | Status: DC

## 2017-05-30 NOTE — Telephone Encounter (Signed)
I did PA last week with [facility]. I called to follow up but PA was denied due to patient not trying & failing at least two of the medications Byetta, Bydureon or tanzeum. I stated that patient takes metformin. Please advise?

## 2017-05-30 NOTE — Telephone Encounter (Signed)
I have sent a prescription to your pharmacy I'll see you next time.

## 2017-05-30 NOTE — Telephone Encounter (Signed)
I called patient & she stated that she was fine with trying the Byetta first, so its ok to send in prescription.

## 2017-05-30 NOTE — Telephone Encounter (Signed)
please call patient: Medicaid perfers byetta.  This is twice a day.  I would be happy to prescribe for you.

## 2017-05-30 NOTE — Telephone Encounter (Signed)
Patient is calling on the status of the trulicity please advise

## 2017-06-03 ENCOUNTER — Emergency Department (HOSPITAL_COMMUNITY): Payer: Medicaid Other

## 2017-06-03 ENCOUNTER — Emergency Department (HOSPITAL_COMMUNITY)
Admission: EM | Admit: 2017-06-03 | Discharge: 2017-06-04 | Disposition: A | Discharge status: Home / Self Care | Payer: Medicaid Other | Attending: Emergency Medicine | Admitting: Emergency Medicine

## 2017-06-03 ENCOUNTER — Other Ambulatory Visit: Payer: Self-pay

## 2017-06-03 DIAGNOSIS — I110 Hypertensive heart disease with heart failure: Secondary | ICD-10-CM | POA: Diagnosis not present

## 2017-06-03 DIAGNOSIS — R0789 Other chest pain: Secondary | ICD-10-CM

## 2017-06-03 DIAGNOSIS — I509 Heart failure, unspecified: Secondary | ICD-10-CM | POA: Insufficient documentation

## 2017-06-03 DIAGNOSIS — Z7982 Long term (current) use of aspirin: Secondary | ICD-10-CM | POA: Diagnosis not present

## 2017-06-03 DIAGNOSIS — Z794 Long term (current) use of insulin: Secondary | ICD-10-CM | POA: Diagnosis not present

## 2017-06-03 DIAGNOSIS — Z79899 Other long term (current) drug therapy: Secondary | ICD-10-CM | POA: Insufficient documentation

## 2017-06-03 DIAGNOSIS — R9431 Abnormal electrocardiogram [ECG] [EKG]: Secondary | ICD-10-CM | POA: Insufficient documentation

## 2017-06-03 DIAGNOSIS — E119 Type 2 diabetes mellitus without complications: Secondary | ICD-10-CM | POA: Insufficient documentation

## 2017-06-03 DIAGNOSIS — Z87891 Personal history of nicotine dependence: Secondary | ICD-10-CM | POA: Insufficient documentation

## 2017-06-03 DIAGNOSIS — R079 Chest pain, unspecified: Secondary | ICD-10-CM | POA: Diagnosis present

## 2017-06-03 DIAGNOSIS — Z7901 Long term (current) use of anticoagulants: Secondary | ICD-10-CM | POA: Insufficient documentation

## 2017-06-03 LAB — COMPREHENSIVE METABOLIC PANEL
ALT: 15 U/L (ref 14–54)
ANION GAP: 8 (ref 5–15)
AST: 22 U/L (ref 15–41)
Albumin: 2.7 g/dL — ABNORMAL LOW (ref 3.5–5.0)
Alkaline Phosphatase: 95 U/L (ref 38–126)
BUN: 12 mg/dL (ref 6–20)
CALCIUM: 8.6 mg/dL — AB (ref 8.9–10.3)
CHLORIDE: 102 mmol/L (ref 101–111)
CO2: 22 mmol/L (ref 22–32)
Creatinine, Ser: 1.03 mg/dL — ABNORMAL HIGH (ref 0.44–1.00)
Glucose, Bld: 290 mg/dL — ABNORMAL HIGH (ref 65–99)
Potassium: 3.6 mmol/L (ref 3.5–5.1)
SODIUM: 132 mmol/L — AB (ref 135–145)
Total Bilirubin: 0.2 mg/dL — ABNORMAL LOW (ref 0.3–1.2)
Total Protein: 6.6 g/dL (ref 6.5–8.1)

## 2017-06-03 LAB — CBC
HCT: 36.6 % (ref 36.0–46.0)
Hemoglobin: 11.9 g/dL — ABNORMAL LOW (ref 12.0–15.0)
MCH: 28.5 pg (ref 26.0–34.0)
MCHC: 32.5 g/dL (ref 30.0–36.0)
MCV: 87.8 fL (ref 78.0–100.0)
PLATELETS: 292 10*3/uL (ref 150–400)
RBC: 4.17 MIL/uL (ref 3.87–5.11)
RDW: 14.1 % (ref 11.5–15.5)
WBC: 10.7 10*3/uL — ABNORMAL HIGH (ref 4.0–10.5)

## 2017-06-03 LAB — I-STAT TROPONIN, ED: Troponin i, poc: 0.02 ng/mL (ref 0.00–0.08)

## 2017-06-03 MED ORDER — KETOROLAC TROMETHAMINE 30 MG/ML IJ SOLN
30.0000 mg | Freq: Once | INTRAMUSCULAR | Status: AC
  Administered 2017-06-03: 30 mg via INTRAVENOUS
  Filled 2017-06-03: qty 1

## 2017-06-03 MED ORDER — MORPHINE SULFATE (PF) 4 MG/ML IV SOLN
2.0000 mg | Freq: Once | INTRAVENOUS | Status: AC
  Administered 2017-06-03: 2 mg via INTRAVENOUS
  Filled 2017-06-03: qty 1

## 2017-06-03 MED ORDER — NITROGLYCERIN 0.4 MG SL SUBL
0.4000 mg | SUBLINGUAL_TABLET | SUBLINGUAL | Status: DC | PRN
  Administered 2017-06-03 – 2017-06-04 (×2): 0.4 mg via SUBLINGUAL
  Filled 2017-06-03: qty 1

## 2017-06-03 NOTE — ED Triage Notes (Signed)
Per pt she was at home this am and was having chest pain and began getting worse throughout the day. Pt took a 325 aspirin at home no relief. Ems gave 1 nitro no relief. Palpation and cough made the pain worse. 115/92, 100 hr, rr 22, 98 on 2L, cbg 366

## 2017-06-03 NOTE — ED Notes (Signed)
ekg given to Dr. Jeanell Sparrow

## 2017-06-03 NOTE — ED Notes (Signed)
Pt has been on the phone the entire time. She has been sitting up and would not take the phone from her ear while being triaged.

## 2017-06-03 NOTE — ED Provider Notes (Signed)
[facility] EMERGENCY DEPARTMENT Provider Note   CSN: [id number] Arrival date & time: 06/03/17  2058     History   Chief Complaint Chief Complaint  Patient presents with  . Chest Pain    HPI Kandace Elrod is a 46 y.o. female.  HPI  46 year old female history of renal failure, CHF, diabetes presents today with sharp pain that began this morning on awakening at approximately 830.  It has been present constantly.  It is worse with movement and coughing.  She has not had similar pain in the past.  She has not taken any medication for this.  She denies any PE or DVT.  She denies lateralized leg swelling.  Past Medical History:  Diagnosis Date  . Acute renal failure (HCC)     Intractable nausea vomiting secondary to diabetic gastroparesis causing dehydration and acute renal failure /notes 04/01/2013  . Anginal pain ([place])   . Anxiety   . Bell's palsy 08/09/2010  . CAP (community acquired pneumonia) 10/2011   Archie Endo 11/08/2011  . Chest pain 01/13/2014  . CHF (congestive heart failure) ([place])   . Diabetic gastroparesis associated with type 2 diabetes mellitus ([place])    this is presumed diagnoses, not confirmed by any studies.   . Eczema   . Family history of malignant neoplasm of breast   . Family history of malignant neoplasm of ovary   . GERD (gastroesophageal reflux disease)   . High cholesterol   . Hypertension   . Liver mass 2014   biopsied 03/2013 at [facility], not malignant.  is to undergo radiologic ablation of the mass in sept/October 2014.   . Lupus   . Migraines    "maybe a couple times/yr" (04/01/2013)  . Obesity   . Obstructive sleep apnea on CPAP 2011  . On home oxygen therapy    "2L 24/7" (02/28/2015)  . SJOGREN'S SYNDROME 08/09/2010  . SLE (systemic lupus erythematosus) ([place])    Archie Endo 12/01/2010  . Stroke [facility]) 2010; 10/2012   "left side is still weak from it, never fully regained full strength; no additions from stroke 10/2012"    Patient Active  Problem List   Diagnosis Date Noted  . Diabetes ([place]) 09/19/2015  . Intractable pain 02/27/2015  . Radiculopathy of cervical spine   . Severe headache 03/05/2014  . Family history of malignant neoplasm of breast   . Family history of malignant neoplasm of ovary   . Intractable nausea and vomiting 11/11/2013  . Septate uterus 09/08/2013  . Diabetic gastroparesis ([place]) 04/28/2013  . Constipation due to pain medication 04/21/2013  . Odynophagia 04/04/2013  . Liver mass 02/28/2013  . Reflux esophagitis 02/26/2013  . Morbid obesity ([place]) 10/10/2012  . CVA (cerebral infarction) 10/09/2012  . Chronic respiratory failure with hypoxia ([place]) 10/09/2012  . Clinical polymyositis syndrome ([place]) 09/06/2012  . Mononeuritis of lower limb 01/12/2012  . Shortness of breath 11/08/2011  . HTN (hypertension) 11/08/2011  . Systolic CHF, acute on chronic ([place]) 11/08/2011  . HLD (hyperlipidemia) 07/19/2011  . Bell's palsy 08/09/2010  . SLE 08/09/2010  . [place] SYNDROME 08/09/2010    Past Surgical History:  Procedure Laterality Date  . BREAST CYST EXCISION Left 08/2005   epidermoid  . CARDIAC CATHETERIZATION  02/07/12  . ECTOPIC PREGNANCY SURGERY  1999  . ESOPHAGOGASTRODUODENOSCOPY N/A 02/26/2013   Procedure: ESOPHAGOGASTRODUODENOSCOPY (EGD);  Surgeon: Irene Shipper, MD;  Location: [facility] ENDOSCOPY;  Service: Endoscopy;  Laterality: N/A;  . ESOPHAGOGASTRODUODENOSCOPY N/A 03/02/2015   Procedure: ESOPHAGOGASTRODUODENOSCOPY (  EGD);  Surgeon: Milus Banister, MD;  Location: [facility];  Service: Endoscopy;  Laterality: N/A;  . HERNIA REPAIR    . LEFT AND RIGHT HEART CATHETERIZATION WITH CORONARY ANGIOGRAM N/A 02/07/2012   Procedure: LEFT AND RIGHT HEART CATHETERIZATION WITH CORONARY ANGIOGRAM;  Surgeon: Laverda Page, MD;  Location: [facility] CATH LAB;  Service: Cardiovascular;  Laterality: N/A;  . LIVER BIOPSY  03/2013   liver mass/medical hx noted above  . MUSCLE BIOPSY     for lupus/notes 12/01/2010  .  UMBILICAL HERNIA REPAIR  1980's    OB History    Gravida Para Term Preterm AB Living   2 1 1   1 1    SAB TAB Ectopic Multiple Live Births       1   1       Home Medications    Prior to Admission medications   Medication Sig Start Date End Date Taking? Authorizing Provider  ACCU-CHEK SOFTCLIX LANCETS lancets USE TO TEST BLOOD SUGAR FOUR TIMES DAILY. 08/24/16   Renato Shin, MD  albuterol (PROAIR HFA) 108 (90 BASE) MCG/ACT inhaler Inhale 2 puffs into the lungs every 6 (six) hours as needed for wheezing or shortness of breath.     [provider]  albuterol (PROVENTIL) (5 MG/ML) 0.5% nebulizer solution Take 2.5 mg by nebulization every 6 (six) hours as needed for wheezing or shortness of breath.  06/24/14   [provider]  alendronate (FOSAMAX) 70 MG tablet Take 70 mg by mouth every Saturday. Take with a full glass of water on an empty stomach.    [provider]  aspirin EC 81 MG tablet Take 81 mg by mouth daily.    [provider]  azaTHIOprine (IMURAN) 50 MG tablet Take 100 mg by mouth 2 (two) times daily.     [provider]  BD INSULIN SYRINGE U-500 31G X 6MM 0.5 ML MISC USE TO INJECT INSULIN AS DIRECTED 05/12/17   Renato Shin, MD  Blood Glucose Monitoring Suppl (ACCU-CHEK AVIVA PLUS) w/Device KIT Use to check blood sugar 4 times per day. 01/13/16   Renato Shin, MD  buPROPion [facility]) 100 MG tablet Take 100 mg by mouth every morning. 10/20/16   [provider]  carvedilol (COREG) 25 MG tablet Take 25 mg by mouth 2 (two) times daily with a meal.    [provider]  cetirizine (ZYRTEC) 10 MG tablet Take 10 mg by mouth daily.    [provider]  clopidogrel (PLAVIX) 75 MG tablet Take 75 mg by mouth daily.    [provider]  clotrimazole-betamethasone (LOTRISONE) cream Apply 1 application topically daily. To feet    [provider]  cycloSPORINE (RESTASIS) 0.05 % ophthalmic emulsion Place 1  drop into both eyes 3 (three) times daily.    [provider]  diclofenac sodium (VOLTAREN) 1 % GEL Apply 2-4 g topically See admin instructions. TO AFFECTED AREAS TWO TIMES A DAY AS NEEDED FOR PAIN    [provider]  doxepin (SINEQUAN) 50 MG capsule Take 50-100 mg by mouth at bedtime as needed (for sleep).    [provider]  exenatide (BYETTA) 5 MCG/0.02ML SOPN injection Inject 0.02 mLs (5 mcg total) into the skin 2 (two) times daily with a meal. 05/30/17   Renato Shin, MD  famotidine (PEPCID) 20 MG tablet Take 20 mg by mouth 2 (two) times daily.    [provider]  glucose blood (ACCU-CHEK AVIVA PLUS) test strip Check blood sugar  7 times per day 10/27/16   Renato Shin, MD  hydrALAZINE (APRESOLINE) 50 MG tablet Take 50 mg by mouth 3 (three) times daily.    [provider]  hydrochlorothiazide (HYDRODIURIL) 25 MG tablet Take 25 mg by mouth daily.    [provider]  hydroxychloroquine (PLAQUENIL) 200 MG tablet Take 200 mg by mouth 2 (two) times daily.     [provider]  ibuprofen (ADVIL,MOTRIN) 600 MG tablet Take 1 tablet (600 mg total) by mouth every 6 (six) hours as needed. 04/07/17   Shary Decamp, PA-C  insulin regular human CONCENTRATED (HUMULIN R) 500 UNIT/ML injection 3 times a day (just before each meal) 90-110-110 units. Patient taking differently: Inject 90-110 Units into the skin See admin instructions. 110 units before breakfast then 90 units before lunch then 110 units before supper 12/30/16   Renato Shin, MD  lidocaine (LIDODERM) 5 % Place 1 patch onto the skin daily. Remove & Discard patch within 12 hours or as directed by MD 05/28/17   Caccavale, Sophia, PA-C  megestrol (MEGACE) 40 MG tablet Take 40 mg by mouth daily. 01/30/15   [provider]  metFORMIN (GLUCOPHAGE-XR) 500 MG 24 hr tablet Take 1,000 mg by mouth 2 (two) times daily.    [provider]  NUVARING 0.12-0.015 MG/24HR vaginal ring Place 1  each vaginally every 28 (twenty-eight) days.  01/28/15   [provider]  PAZEO 0.7 % SOLN Place 1 drop into both eyes every morning. 02/11/15   [provider]  Polyvinyl Alcohol-Povidone (REFRESH OP) Place 1 drop into both eyes 2 (two) times daily as needed (for dry eyes).     [provider]  potassium chloride SA (K-DUR,KLOR-CON) 20 MEQ tablet Take 20 mEq by mouth 2 (two) times daily.    [provider]  pravastatin (PRAVACHOL) 10 MG tablet Take 10 mg by mouth daily.    [provider]  prednisoLONE 5 MG TABS tablet Take 5 mg by mouth daily.    [provider]  pregabalin (LYRICA) 75 MG capsule Take 75 mg by mouth 2 (two) times daily. 01/28/15   [provider]  torsemide (DEMADEX) 20 MG tablet Take 1 tablet (20 mg total) by mouth 2 (two) times daily. 04/06/13   Regalado, Belkys A, MD  valsartan (DIOVAN) 320 MG tablet Take 320 mg by mouth daily.    [provider]    Family History Family History  Problem Relation Age of Onset  . Diabetes Mother   . Asthma Mother   . Urticaria Mother   . Breast cancer Maternal Aunt 23       currently 75  . Stomach cancer Maternal Uncle        dx 32s; deceased  . Prostate cancer Maternal Uncle        deceased 33s  . Cancer Maternal Aunt        unk. primary  . Cancer Maternal Aunt        unk. primary  . Ovarian cancer Maternal Aunt        deceased 23s  . Hypertension Other   . Stroke Other   . Arthritis Other   . Ovarian cancer Sister 24       maternal half-sister; RAD51C positive  . Cancer Paternal Aunt        unk. primary; deceased 83s  . Prostate cancer Paternal Uncle        deceased 62s  . Breast cancer Cousin  deceased 8s; daughter of mat uncle with stomach ca  . Breast cancer Maternal Aunt        deceased 30s  . Asthma Brother   . Asthma Brother   . Allergic rhinitis Neg Hx   . Angioedema Neg Hx     Social History Social History  Substance Use Topics    . Smoking status: Former Smoker    Years: 3.00    Types: Cigarettes    Quit date: 06/01/1990  . Smokeless tobacco: Never Used  . Alcohol use No     Allergies   Metoclopramide; Oxycodone-acetaminophen; Codeine; Hydrocodone; and Percocet [oxycodone-acetaminophen]   Review of Systems Review of Systems  All other systems reviewed and are negative.    Physical Exam Updated Vital Signs BP (!) 180/88 (BP Location: Right Wrist)   Pulse 79   Temp 98 F (36.7 C) (Oral)   Resp 19   Ht 1.727 m (5' 8" )   Wt (!) 165.1 kg (364 lb)   SpO2 100%   BMI 55.35 kg/m   Physical Exam  Constitutional: She is oriented to person, place, and time. She appears well-developed and well-nourished.  HENT:  Head: Normocephalic and atraumatic.  Right Ear: External ear normal.  Left Ear: External ear normal.  Eyes: Pupils are equal, round, and reactive to light. EOM are normal.  Neck: Normal range of motion. Neck supple.  Cardiovascular: Normal rate and regular rhythm.   Pulmonary/Chest: Effort normal and breath sounds normal.  Abdominal: Soft. Bowel sounds are normal.  Musculoskeletal: Normal range of motion.  Neurological: She is alert and oriented to person, place, and time.  Skin: Skin is warm and dry. Capillary refill takes less than 2 seconds.  Psychiatric: She has a normal mood and affect.  Nursing note and vitals reviewed.    ED Treatments / Results  Labs (all labs ordered are listed, but only abnormal results are displayed) Labs Reviewed  CBC  COMPREHENSIVE METABOLIC PANEL  I-STAT TROPONIN, ED    EKG  EKG Interpretation  Date/Time:  Saturday June 03 2017 21:51:16 EDT Ventricular Rate:  95 PR Interval:    QRS Duration: 90 QT Interval:  364 QTC Calculation: 458 R Axis:   26 Text Interpretation:  Sinus rhythm Premature ventricular complexes Non-specific ST-t changes changes have been present on prior ekgs Confirmed by Pattricia Boss [phone]) on 06/03/2017 9:56:38 PM        Radiology No results found.  Procedures Procedures (including critical care time)  Medications Ordered in ED Medications  ketorolac (TORADOL) 30 MG/ML injection 30 mg (not administered)     Initial Impression / Assessment and Plan / ED Course  I have reviewed the triage vital signs and the nursing notes.  Pertinent labs & imaging results that were available during my care of the patient were reviewed by me and considered in my medical decision making (see chart for details).     46 y.o. Female with morbid obesity, with pain reproducible on exam. She also reports being out of pain send for the past month due to changing pain clinics.  EKG is abnormal but consistent with prior.  Patient has had pain for greater than 12 hours and troponin is normal.  She is given Toradol without relief.  She is given 2 mg of morphine.  Feel the patient is stable for discharge and was advised to follow-up with her chronic pain clinic.  1- chest wall pain 2- abnormal ekg 3- diabetes with hyperglycemia    Final Clinical Impressions(s) /  ED Diagnoses   Final diagnoses:  Chest wall pain  Abnormal EKG    New Prescriptions New Prescriptions   No medications on file     Pattricia Boss, MD 06/04/17 2046

## 2017-06-04 ENCOUNTER — Encounter (HOSPITAL_COMMUNITY): Payer: Self-pay | Admitting: Emergency Medicine

## 2017-06-04 ENCOUNTER — Emergency Department (HOSPITAL_COMMUNITY): Payer: Medicaid Other

## 2017-06-04 ENCOUNTER — Other Ambulatory Visit: Payer: Self-pay

## 2017-06-04 ENCOUNTER — Inpatient Hospital Stay (HOSPITAL_COMMUNITY)
Admission: EM | Admit: 2017-06-04 | Discharge: 2017-06-06 | DRG: 206 | Disposition: A | Discharge status: Home / Self Care | Payer: Medicaid Other | Attending: Internal Medicine | Admitting: Internal Medicine

## 2017-06-04 DIAGNOSIS — K219 Gastro-esophageal reflux disease without esophagitis: Secondary | ICD-10-CM | POA: Diagnosis present

## 2017-06-04 DIAGNOSIS — E1169 Type 2 diabetes mellitus with other specified complication: Secondary | ICD-10-CM

## 2017-06-04 DIAGNOSIS — E662 Morbid (severe) obesity with alveolar hypoventilation: Secondary | ICD-10-CM | POA: Diagnosis present

## 2017-06-04 DIAGNOSIS — G4733 Obstructive sleep apnea (adult) (pediatric): Secondary | ICD-10-CM | POA: Diagnosis present

## 2017-06-04 DIAGNOSIS — Z008 Encounter for other general examination: Secondary | ICD-10-CM

## 2017-06-04 DIAGNOSIS — E1143 Type 2 diabetes mellitus with diabetic autonomic (poly)neuropathy: Secondary | ICD-10-CM | POA: Diagnosis not present

## 2017-06-04 DIAGNOSIS — I10 Essential (primary) hypertension: Secondary | ICD-10-CM | POA: Diagnosis present

## 2017-06-04 DIAGNOSIS — Z7902 Long term (current) use of antithrombotics/antiplatelets: Secondary | ICD-10-CM

## 2017-06-04 DIAGNOSIS — E785 Hyperlipidemia, unspecified: Secondary | ICD-10-CM

## 2017-06-04 DIAGNOSIS — Z794 Long term (current) use of insulin: Secondary | ICD-10-CM | POA: Diagnosis not present

## 2017-06-04 DIAGNOSIS — M329 Systemic lupus erythematosus, unspecified: Secondary | ICD-10-CM | POA: Diagnosis present

## 2017-06-04 DIAGNOSIS — I69354 Hemiplegia and hemiparesis following cerebral infarction affecting left non-dominant side: Secondary | ICD-10-CM

## 2017-06-04 DIAGNOSIS — Z833 Family history of diabetes mellitus: Secondary | ICD-10-CM

## 2017-06-04 DIAGNOSIS — Z6841 Body Mass Index (BMI) 40.0 and over, adult: Secondary | ICD-10-CM

## 2017-06-04 DIAGNOSIS — I110 Hypertensive heart disease with heart failure: Secondary | ICD-10-CM | POA: Diagnosis present

## 2017-06-04 DIAGNOSIS — I152 Hypertension secondary to endocrine disorders: Secondary | ICD-10-CM | POA: Diagnosis present

## 2017-06-04 DIAGNOSIS — I2699 Other pulmonary embolism without acute cor pulmonale: Secondary | ICD-10-CM

## 2017-06-04 DIAGNOSIS — Z7982 Long term (current) use of aspirin: Secondary | ICD-10-CM

## 2017-06-04 DIAGNOSIS — R0789 Other chest pain: Secondary | ICD-10-CM | POA: Diagnosis not present

## 2017-06-04 DIAGNOSIS — Z8249 Family history of ischemic heart disease and other diseases of the circulatory system: Secondary | ICD-10-CM

## 2017-06-04 DIAGNOSIS — Z825 Family history of asthma and other chronic lower respiratory diseases: Secondary | ICD-10-CM

## 2017-06-04 DIAGNOSIS — E1165 Type 2 diabetes mellitus with hyperglycemia: Secondary | ICD-10-CM | POA: Diagnosis present

## 2017-06-04 DIAGNOSIS — E1159 Type 2 diabetes mellitus with other circulatory complications: Secondary | ICD-10-CM | POA: Diagnosis present

## 2017-06-04 DIAGNOSIS — Z9981 Dependence on supplemental oxygen: Secondary | ICD-10-CM

## 2017-06-04 DIAGNOSIS — Z803 Family history of malignant neoplasm of breast: Secondary | ICD-10-CM

## 2017-06-04 DIAGNOSIS — I5032 Chronic diastolic (congestive) heart failure: Secondary | ICD-10-CM | POA: Diagnosis not present

## 2017-06-04 DIAGNOSIS — L309 Dermatitis, unspecified: Secondary | ICD-10-CM | POA: Diagnosis present

## 2017-06-04 DIAGNOSIS — Z7952 Long term (current) use of systemic steroids: Secondary | ICD-10-CM

## 2017-06-04 DIAGNOSIS — E119 Type 2 diabetes mellitus without complications: Secondary | ICD-10-CM

## 2017-06-04 DIAGNOSIS — Z800 Family history of malignant neoplasm of digestive organs: Secondary | ICD-10-CM

## 2017-06-04 DIAGNOSIS — Z880 Allergy status to penicillin: Secondary | ICD-10-CM

## 2017-06-04 DIAGNOSIS — Z7983 Long term (current) use of bisphosphonates: Secondary | ICD-10-CM

## 2017-06-04 DIAGNOSIS — Z888 Allergy status to other drugs, medicaments and biological substances status: Secondary | ICD-10-CM

## 2017-06-04 DIAGNOSIS — Z885 Allergy status to narcotic agent status: Secondary | ICD-10-CM

## 2017-06-04 DIAGNOSIS — Z8041 Family history of malignant neoplasm of ovary: Secondary | ICD-10-CM

## 2017-06-04 DIAGNOSIS — R079 Chest pain, unspecified: Secondary | ICD-10-CM | POA: Diagnosis present

## 2017-06-04 DIAGNOSIS — Z87891 Personal history of nicotine dependence: Secondary | ICD-10-CM

## 2017-06-04 DIAGNOSIS — M940 Chondrocostal junction syndrome [Tietze]: Secondary | ICD-10-CM

## 2017-06-04 LAB — BASIC METABOLIC PANEL
ANION GAP: 10 (ref 5–15)
BUN: 9 mg/dL (ref 6–20)
CHLORIDE: 103 mmol/L (ref 101–111)
CO2: 24 mmol/L (ref 22–32)
Calcium: 8.8 mg/dL — ABNORMAL LOW (ref 8.9–10.3)
Creatinine, Ser: 0.9 mg/dL (ref 0.44–1.00)
GFR calc Af Amer: >60 mL/min (ref >60)
GFR calc non Af Amer: >60 mL/min (ref >60)
GLUCOSE: 298 mg/dL — AB (ref 65–99)
POTASSIUM: 4.1 mmol/L (ref 3.5–5.1)
Sodium: 137 mmol/L (ref 135–145)

## 2017-06-04 LAB — CBC
HEMATOCRIT: 37.8 % (ref 36.0–46.0)
HEMOGLOBIN: 11.9 g/dL — AB (ref 12.0–15.0)
MCH: 27.7 pg (ref 26.0–34.0)
MCHC: 31.5 g/dL (ref 30.0–36.0)
MCV: 87.9 fL (ref 78.0–100.0)
PLATELETS: 281 10*3/uL (ref 150–400)
RBC: 4.3 MIL/uL (ref 3.87–5.11)
RDW: 14.4 % (ref 11.5–15.5)
WBC: 13.9 10*3/uL — ABNORMAL HIGH (ref 4.0–10.5)

## 2017-06-04 LAB — D-DIMER, QUANTITATIVE (NOT AT ARMC): D DIMER QUANT: 0.98 ug{FEU}/mL — AB (ref 0.00–0.50)

## 2017-06-04 LAB — I-STAT TROPONIN, ED: Troponin i, poc: 0.02 ng/mL (ref 0.00–0.08)

## 2017-06-04 LAB — TROPONIN I

## 2017-06-04 MED ORDER — INSULIN ASPART 100 UNIT/ML ~~LOC~~ SOLN
0.0000 [IU] | SUBCUTANEOUS | Status: DC
  Administered 2017-06-05: 7 [IU] via SUBCUTANEOUS
  Administered 2017-06-05: 11 [IU] via SUBCUTANEOUS

## 2017-06-04 MED ORDER — PREDNISONE 1 MG PO TABS
4.0000 mg | ORAL_TABLET | Freq: Every day | ORAL | Status: DC
  Administered 2017-06-05 – 2017-06-06 (×2): 4 mg via ORAL
  Filled 2017-06-04 (×2): qty 4

## 2017-06-04 MED ORDER — HYDRALAZINE HCL 50 MG PO TABS
50.0000 mg | ORAL_TABLET | Freq: Three times a day (TID) | ORAL | Status: DC
  Administered 2017-06-04 – 2017-06-06 (×5): 50 mg via ORAL
  Filled 2017-06-04 (×4): qty 1
  Filled 2017-06-04: qty 2

## 2017-06-04 MED ORDER — HYDROXYCHLOROQUINE SULFATE 200 MG PO TABS
200.0000 mg | ORAL_TABLET | Freq: Two times a day (BID) | ORAL | Status: DC
  Administered 2017-06-05 – 2017-06-06 (×4): 200 mg via ORAL
  Filled 2017-06-04 (×4): qty 1

## 2017-06-04 MED ORDER — OLOPATADINE HCL 0.1 % OP SOLN: 1.0000 [drp] | Freq: Every day | OPHTHALMIC | Status: DC | PRN

## 2017-06-04 MED ORDER — FAMOTIDINE 20 MG PO TABS
20.0000 mg | ORAL_TABLET | Freq: Two times a day (BID) | ORAL | Status: DC
  Administered 2017-06-05 – 2017-06-06 (×4): 20 mg via ORAL
  Filled 2017-06-04 (×4): qty 1

## 2017-06-04 MED ORDER — HEPARIN BOLUS VIA INFUSION
6000.0000 [IU] | Freq: Once | INTRAVENOUS | Status: AC
  Administered 2017-06-04: 6000 [IU] via INTRAVENOUS
  Filled 2017-06-04: qty 6000

## 2017-06-04 MED ORDER — ONDANSETRON HCL 4 MG PO TABS: 4.0000 mg | ORAL_TABLET | Freq: Four times a day (QID) | ORAL | Status: DC | PRN

## 2017-06-04 MED ORDER — FENTANYL CITRATE (PF) 100 MCG/2ML IJ SOLN
50.0000 ug | INTRAMUSCULAR | Status: DC | PRN
  Administered 2017-06-05 (×3): 50 ug via INTRAVENOUS
  Filled 2017-06-04 (×3): qty 2

## 2017-06-04 MED ORDER — ALBUTEROL SULFATE HFA 108 (90 BASE) MCG/ACT IN AERS: 2.0000 | INHALATION_SPRAY | Freq: Four times a day (QID) | RESPIRATORY_TRACT | Status: DC | PRN

## 2017-06-04 MED ORDER — CYCLOBENZAPRINE HCL 10 MG PO TABS: 10.0000 mg | ORAL_TABLET | Freq: Two times a day (BID) | ORAL | Status: DC | PRN

## 2017-06-04 MED ORDER — PRAVASTATIN SODIUM 10 MG PO TABS
10.0000 mg | ORAL_TABLET | Freq: Every day | ORAL | Status: DC
  Administered 2017-06-05: 10 mg via ORAL
  Filled 2017-06-04 (×2): qty 1

## 2017-06-04 MED ORDER — ACETAMINOPHEN 325 MG PO TABS: 650.0000 mg | ORAL_TABLET | Freq: Four times a day (QID) | ORAL | Status: DC | PRN

## 2017-06-04 MED ORDER — BUPROPION HCL 100 MG PO TABS
100.0000 mg | ORAL_TABLET | Freq: Every day | ORAL | Status: DC
  Administered 2017-06-05 – 2017-06-06 (×2): 100 mg via ORAL
  Filled 2017-06-04 (×2): qty 1

## 2017-06-04 MED ORDER — DICLOFENAC SODIUM 1 % TD GEL: 2.0000 g | Freq: Two times a day (BID) | TRANSDERMAL | Status: DC | PRN

## 2017-06-04 MED ORDER — ALBUTEROL SULFATE (2.5 MG/3ML) 0.083% IN NEBU: 2.5000 mg | INHALATION_SOLUTION | Freq: Four times a day (QID) | RESPIRATORY_TRACT | Status: DC | PRN

## 2017-06-04 MED ORDER — ASPIRIN EC 81 MG PO TBEC
81.0000 mg | DELAYED_RELEASE_TABLET | Freq: Every day | ORAL | Status: DC
  Administered 2017-06-05 – 2017-06-06 (×2): 81 mg via ORAL
  Filled 2017-06-04 (×2): qty 1

## 2017-06-04 MED ORDER — DOXEPIN HCL 50 MG PO CAPS: 50.0000 mg | ORAL_CAPSULE | Freq: Every evening | ORAL | Status: DC | PRN

## 2017-06-04 MED ORDER — AZATHIOPRINE 50 MG PO TABS
100.0000 mg | ORAL_TABLET | Freq: Two times a day (BID) | ORAL | Status: DC
  Administered 2017-06-05 – 2017-06-06 (×4): 100 mg via ORAL
  Filled 2017-06-04 (×4): qty 2

## 2017-06-04 MED ORDER — MEGESTROL ACETATE 40 MG PO TABS
40.0000 mg | ORAL_TABLET | Freq: Every day | ORAL | Status: DC
  Administered 2017-06-05 – 2017-06-06 (×2): 40 mg via ORAL
  Filled 2017-06-04 (×2): qty 1

## 2017-06-04 MED ORDER — ONDANSETRON HCL 4 MG/2ML IJ SOLN
4.0000 mg | Freq: Four times a day (QID) | INTRAMUSCULAR | Status: DC | PRN
  Filled 2017-06-04: qty 2

## 2017-06-04 MED ORDER — FENTANYL CITRATE (PF) 100 MCG/2ML IJ SOLN
50.0000 ug | Freq: Once | INTRAMUSCULAR | Status: AC
  Administered 2017-06-04: 50 ug via INTRAVENOUS
  Filled 2017-06-04: qty 2

## 2017-06-04 MED ORDER — HYDROCHLOROTHIAZIDE 25 MG PO TABS
25.0000 mg | ORAL_TABLET | Freq: Every day | ORAL | Status: DC
  Administered 2017-06-04 – 2017-06-06 (×3): 25 mg via ORAL
  Filled 2017-06-04 (×3): qty 1

## 2017-06-04 MED ORDER — MOMETASONE FURO-FORMOTEROL FUM 100-5 MCG/ACT IN AERO
2.0000 | INHALATION_SPRAY | Freq: Two times a day (BID) | RESPIRATORY_TRACT | Status: DC
  Administered 2017-06-05 – 2017-06-06 (×3): 2 via RESPIRATORY_TRACT
  Filled 2017-06-04: qty 8.8

## 2017-06-04 MED ORDER — HEPARIN (PORCINE) IN NACL 100-0.45 UNIT/ML-% IJ SOLN
2100.0000 [IU]/h | INTRAMUSCULAR | Status: DC
  Administered 2017-06-04: 1550 [IU]/h via INTRAVENOUS
  Administered 2017-06-05: 2100 [IU]/h via INTRAVENOUS
  Filled 2017-06-04 (×4): qty 250

## 2017-06-04 MED ORDER — CARVEDILOL 25 MG PO TABS
25.0000 mg | ORAL_TABLET | Freq: Two times a day (BID) | ORAL | Status: DC
  Administered 2017-06-04 – 2017-06-05 (×3): 25 mg via ORAL
  Filled 2017-06-04: qty 1
  Filled 2017-06-04: qty 2
  Filled 2017-06-04 (×2): qty 1

## 2017-06-04 MED ORDER — CYCLOSPORINE 0.05 % OP EMUL
1.0000 [drp] | Freq: Three times a day (TID) | OPHTHALMIC | Status: DC
  Administered 2017-06-05 – 2017-06-06 (×4): 1 [drp] via OPHTHALMIC
  Filled 2017-06-04 (×5): qty 1

## 2017-06-04 MED ORDER — PREGABALIN 75 MG PO CAPS
75.0000 mg | ORAL_CAPSULE | Freq: Three times a day (TID) | ORAL | Status: DC
  Administered 2017-06-05 – 2017-06-06 (×5): 75 mg via ORAL
  Filled 2017-06-04 (×4): qty 1
  Filled 2017-06-04: qty 3

## 2017-06-04 MED ORDER — IOPAMIDOL (ISOVUE-370) INJECTION 76%
INTRAVENOUS | Status: AC
  Administered 2017-06-04: 100 mL
  Filled 2017-06-04: qty 100

## 2017-06-04 MED ORDER — TORSEMIDE 20 MG PO TABS
20.0000 mg | ORAL_TABLET | Freq: Two times a day (BID) | ORAL | Status: DC
  Administered 2017-06-05 – 2017-06-06 (×3): 20 mg via ORAL
  Filled 2017-06-04 (×3): qty 1

## 2017-06-04 MED ORDER — ACETAMINOPHEN 650 MG RE SUPP: 650.0000 mg | Freq: Four times a day (QID) | RECTAL | Status: DC | PRN

## 2017-06-04 MED ORDER — ALPRAZOLAM 0.5 MG PO TABS: 0.5000 mg | ORAL_TABLET | Freq: Two times a day (BID) | ORAL | Status: DC | PRN

## 2017-06-04 MED ORDER — POTASSIUM CHLORIDE CRYS ER 20 MEQ PO TBCR
20.0000 meq | EXTENDED_RELEASE_TABLET | Freq: Two times a day (BID) | ORAL | Status: DC
  Administered 2017-06-05 – 2017-06-06 (×4): 20 meq via ORAL
  Filled 2017-06-04 (×4): qty 1

## 2017-06-04 NOTE — ED Notes (Signed)
Patient transported to CT 

## 2017-06-04 NOTE — ED Triage Notes (Signed)
Pt states she was discharged from ED this morning for chest pain and continues to have pain under L breast that radiates around L side and into back with sob and nausea.  States pain never went away and is now greater than 10/10.

## 2017-06-04 NOTE — Progress Notes (Signed)
ANTICOAGULATION CONSULT NOTE - Initial Consult  Pharmacy Consult for heparin Indication: pulmonary embolus  Allergies  Allergen Reactions  . Metoclopramide Other (See Comments)    Developed restless leg, akathisia type limb movements.   . Oxycodone-Acetaminophen Hives and Rash    Tolerates Dilaudid  . Codeine Itching, Rash and Other (See Comments)  . Penicillins Itching    Has patient had a PCN reaction causing immediate rash, facial/tongue/throat swelling, SOB or lightheadedness with hypotension: No Has patient had a PCN reaction causing severe rash involving mucus membranes or skin necrosis: No Has patient had a PCN reaction that required hospitalization: No Has patient had a PCN reaction occurring within the last 10 years:yes If all of the above answers are "NO", then may proceed with Cephalosporin use.  Marland Kitchen Hydrocodone Rash  . Percocet [Oxycodone-Acetaminophen] Hives and Rash    Patient Measurements:    Weight: 165.1kg Height: 5'8" Heparin Dosing Weight: 105kg  Vital Signs: Temp: 98.3 F (36.8 C) (11/04 1637) Temp Source: Oral (11/04 1637) BP: 134/61 (11/04 2100) Pulse Rate: 99 (11/04 2100)  Labs: Recent Labs    06/03/17 2225 06/04/17 0014 06/04/17 1634  HGB 11.9*  --  11.9*  HCT 36.6  --  37.8  PLT 292  --  281  CREATININE 1.03*  --  0.90  TROPONINI  --  <0.03  --     Estimated Creatinine Clearance: 128.7 mL/min (by C-G formula based on SCr of 0.9 mg/dL).   Assessment: Patient was seen earlier today with chest pain -discharged and pain has not gone away. D-dimer elevated. Hgb 11.9, plts 281.  Goal of Therapy:  Heparin level 0.3-0.7 units/ml Monitor platelets by anticoagulation protocol: Yes   Plan:  Heparin bolus with 6000 units IV x1, then start heparin infusion at 1550 units/hr Heparin level in 6 hours Daily heparin level and CBC Follow plan for long term anticoagulation  Rheba Diamond D. Nneoma Harral, PharmD, BCPS Clinical Pharmacist  [phone] 06/04/2017 9:57  PM

## 2017-06-04 NOTE — ED Notes (Signed)
Ice provided per EDP

## 2017-06-04 NOTE — ED Provider Notes (Signed)
[facility] Provider Note   CSN: [id number] Arrival date & time: 06/04/17  1627     History   Chief Complaint Chief Complaint  Patient presents with  . Chest Pain    HPI [name] is a 46 y.o. female.  HPI Patient presents to the emergency room for evaluation of chest pain.  Patient states her symptoms started yesterday.  She has had a sharp pain in her chest.  Pain increases with breathing coughing and movement.  Patient presented to the emergency room yesterday.   Patient's dilation was reassuring.  She was discharged and released.  She presented back to the emergency room today because of her persistent symptoms.  Patient states that it is getting worse.  It is hurting in her chest.  It increases with breathing.  She denies any recent leg swelling. Past Medical History:  Diagnosis Date  . Acute renal failure (HCC)     Intractable nausea vomiting secondary to diabetic gastroparesis causing dehydration and acute renal failure /notes 04/01/2013  . Anginal pain ([place])   . Anxiety   . Bell's palsy 08/09/2010  . CAP (community acquired pneumonia) 10/2011   Archie Endo 11/08/2011  . Chest pain 01/13/2014  . CHF (congestive heart failure) ([place])   . Diabetic gastroparesis associated with type 2 diabetes mellitus ([place])    this is presumed diagnoses, not confirmed by any studies.   . Eczema   . Family history of malignant neoplasm of breast   . Family history of malignant neoplasm of ovary   . GERD (gastroesophageal reflux disease)   . High cholesterol   . Hypertension   . Liver mass 2014   biopsied 03/2013 at [facility], not malignant.  is to undergo radiologic ablation of the mass in sept/October 2014.   . Lupus   . Migraines    "maybe a couple times/yr" (04/01/2013)  . Obesity   . Obstructive sleep apnea on CPAP 2011  . On home oxygen therapy    "2L 24/7" (02/28/2015)  . SJOGREN'S SYNDROME 08/09/2010  . SLE (systemic lupus erythematosus) ([place])     Archie Endo 12/01/2010  . Stroke [facility]) 2010; 10/2012   "left side is still weak from it, never fully regained full strength; no additions from stroke 10/2012"    Patient Active Problem List   Diagnosis Date Noted  . Diabetes ([place]) 09/19/2015  . Intractable pain 02/27/2015  . Radiculopathy of cervical spine   . Severe headache 03/05/2014  . Family history of malignant neoplasm of breast   . Family history of malignant neoplasm of ovary   . Intractable nausea and vomiting 11/11/2013  . Septate uterus 09/08/2013  . Diabetic gastroparesis ([place]) 04/28/2013  . Constipation due to pain medication 04/21/2013  . Odynophagia 04/04/2013  . Liver mass 02/28/2013  . Reflux esophagitis 02/26/2013  . Morbid obesity ([place]) 10/10/2012  . CVA (cerebral infarction) 10/09/2012  . Chronic respiratory failure with hypoxia ([place]) 10/09/2012  . Clinical polymyositis syndrome ([place]) 09/06/2012  . Mononeuritis of lower limb 01/12/2012  . Shortness of breath 11/08/2011  . HTN (hypertension) 11/08/2011  . Systolic CHF, acute on chronic ([place]) 11/08/2011  . HLD (hyperlipidemia) 07/19/2011  . Bell's palsy 08/09/2010  . SLE 08/09/2010  . 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 BREAST CYST EXCISION Left 08/2005   epidermoid  . CARDIAC CATHETERIZATION  02/07/12  . ECTOPIC PREGNANCY SURGERY  1999  . HERNIA REPAIR    .  LIVER BIOPSY  03/2013   liver mass/medical hx noted above  . MUSCLE BIOPSY     for lupus/notes 12/01/2010  . UMBILICAL HERNIA REPAIR  1980's    OB History    Gravida Para Term Preterm AB Living   2 1 1   1 1    SAB TAB Ectopic Multiple Live Births       1   1       Home Medications    Prior to Admission medications   Medication Sig Start Date End Date Taking? Authorizing Provider  albuterol (PROAIR HFA) 108 (90 BASE) MCG/ACT inhaler Inhale 2 puffs into the lungs every 6 (six) hours as needed for wheezing or shortness of breath.     [provider]  albuterol (PROVENTIL) (5 MG/ML) 0.5% nebulizer solution Take 2.5 mg by nebulization every 6 (six) hours as needed for wheezing or shortness of breath.  06/24/14   [provider]  alendronate (FOSAMAX) 70 MG tablet Take 70 mg by mouth every Saturday. Take with a full glass of water on an empty stomach.    [provider]  ALPRAZolam Duanne Moron) 0.5 MG tablet Take 0.5 mg by mouth as needed. 04/19/17   [provider]  aspirin EC 81 MG tablet Take 81 mg by mouth daily.    [provider]  azaTHIOprine (IMURAN) 50 MG tablet Take 100 mg by mouth 2 (two) times daily.     [provider]  buPROPion (WELLBUTRIN) 100 MG tablet Take 100 mg by mouth every morning. 10/20/16   [provider]  carvedilol (COREG) 25 MG tablet Take 25 mg by mouth 2 (two) times daily with a meal.    [provider]  cetirizine (ZYRTEC) 10 MG tablet Take 10 mg by mouth daily.    [provider]  clopidogrel (PLAVIX) 75 MG tablet Take 75 mg by mouth daily.    [provider]  clotrimazole-betamethasone (LOTRISONE) cream Apply 1 application topically daily. To feet    [provider]  cyclobenzaprine (FLEXERIL) 10 MG tablet Take 10 mg by mouth as needed. 04/19/17   [provider]  cycloSPORINE (RESTASIS) 0.05 % ophthalmic emulsion Place 1 drop into both eyes 3 (three) times daily.    [provider]  diclofenac sodium (VOLTAREN) 1 % GEL Apply 2-4 g topically See admin instructions. TO AFFECTED AREAS TWO TIMES A DAY AS NEEDED FOR PAIN    [provider]  doxepin (SINEQUAN) 50 MG capsule Take 50-100 mg by mouth at bedtime as needed (for sleep).    [provider]  Dulaglutide 0.75 MG/0.5ML SOPN Inject 0.75 mg into the skin once a week. 05/15/17   [provider]  exenatide (BYETTA) 5 MCG/0.02ML SOPN injection Inject 0.02 mLs (5 mcg total) into the skin 2 (two) times daily with a meal. 05/30/17    Renato Shin, MD  famotidine (PEPCID) 20 MG tablet Take 20 mg by mouth 2 (two) times daily.    [provider]  hydrALAZINE (APRESOLINE) 50 MG tablet Take 50 mg by mouth 3 (three) times daily.    [provider]  hydrochlorothiazide (HYDRODIURIL) 25 MG tablet Take 25 mg by mouth daily.    [provider]  hydroxychloroquine (PLAQUENIL) 200 MG tablet Take 200 mg by mouth 2 (two) times daily.     [provider]  ibuprofen (ADVIL,MOTRIN) 600 MG tablet Take 1 tablet (600 mg total) by mouth every 6 (six) hours as needed. Patient not taking: Reported  on 06/03/2017 04/07/17   Shary Decamp, PA-C  insulin regular human CONCENTRATED (HUMULIN R) 500 UNIT/ML injection 3 times a day (just before each meal) 90-110-110 units. Patient taking differently: Inject 90-110 Units into the skin See admin instructions. 110 units before breakfast then 90 units before lunch then 110 units before supper 12/30/16   Renato Shin, MD  lidocaine (LIDODERM) 5 % Place 1 patch onto the skin daily. Remove & Discard patch within 12 hours or as directed by MD 05/28/17   Caccavale, Sophia, PA-C  megestrol (MEGACE) 40 MG tablet Take 40 mg by mouth daily. 01/30/15   [provider]  metFORMIN (GLUCOPHAGE-XR) 500 MG 24 hr tablet Take 1,000 mg by mouth 2 (two) times daily.    [provider]  mometasone-formoterol (DULERA) 100-5 MCG/ACT AERO Inhale 2 puffs into the lungs 2 (two) times daily. 05/29/17   [provider]  NUVARING 0.12-0.015 MG/24HR vaginal ring Place 1 each vaginally every 28 (twenty-eight) days.  01/28/15   [provider]  PAZEO 0.7 % SOLN Place 1 drop into both eyes every morning. 02/11/15   [provider]  Polyvinyl Alcohol-Povidone (REFRESH OP) Place 1 drop into both eyes 2 (two) times daily as needed (for dry eyes).     [provider]  potassium chloride SA (K-DUR,KLOR-CON) 20 MEQ tablet Take 20 mEq by mouth 2 (two) times daily.     [provider]  pravastatin (PRAVACHOL) 10 MG tablet Take 10 mg by mouth daily.    [provider]  predniSONE (DELTASONE) 1 MG tablet Take 4 mg by mouth daily. 04/21/17   [provider]  pregabalin (LYRICA) 75 MG capsule Take 75 mg by mouth 2 (two) times daily. 01/28/15   [provider]  torsemide (DEMADEX) 20 MG tablet Take 1 tablet (20 mg total) by mouth 2 (two) times daily. 04/06/13   Regalado, Cassie Freer, MD    Family History Family History  Problem Relation Age of Onset  . Diabetes Mother   . Asthma Mother   . Urticaria Mother   . Breast cancer Maternal Aunt 61       currently 33  . Stomach cancer Maternal Uncle        dx 12s; deceased  . Prostate cancer Maternal Uncle        deceased 56s  . Cancer Maternal Aunt        unk. primary  . Cancer Maternal Aunt        unk. primary  . Ovarian cancer Maternal Aunt        deceased 58s  . Hypertension Other   . Stroke Other   . Arthritis Other   . Ovarian cancer Sister 61       maternal half-sister; RAD51C positive  . Cancer Paternal Aunt        unk. primary; deceased 71s  . Prostate cancer Paternal Uncle        deceased 54s  . Breast cancer Cousin        deceased 70s; daughter of mat uncle with stomach ca  . Breast cancer Maternal Aunt        deceased 36s  . Asthma Brother   . Asthma Brother   . Allergic rhinitis Neg Hx   . Angioedema Neg Hx     Social History Social History   Tobacco Use  . Smoking status: Former Smoker    Years: 3.00    Types: Cigarettes    Last attempt to quit: 06/01/1990  Years since quitting: 27.0  . Smokeless tobacco: Never Used  Substance Use Topics  . Alcohol use: No    Alcohol/week: 0.0 oz  . Drug use: No     Allergies   Metoclopramide; Oxycodone-acetaminophen; Codeine; Penicillins; Hydrocodone; and Percocet [oxycodone-acetaminophen]   Review of Systems Review of Systems  All other systems reviewed and are negative.    Physical  Exam Updated Vital Signs BP 134/61   Pulse 99   Temp 98.3 F (36.8 C) (Oral)   Resp 13   LMP 03/24/2017   SpO2 100%   Physical Exam  Constitutional: No distress.  Morbidly obese  HENT:  Head: Normocephalic and atraumatic.  Right Ear: External ear normal.  Left Ear: External ear normal.  Eyes: Conjunctivae are normal. Right eye exhibits no discharge. Left eye exhibits no discharge. No scleral icterus.  Neck: Neck supple. No tracheal deviation present.  Cardiovascular: Normal rate, regular rhythm and intact distal pulses.  Pulmonary/Chest: Effort normal and breath sounds normal. No stridor. No respiratory distress. She has no decreased breath sounds. She has no wheezes. She has no rales.  Tenderness to palpation of the chest wall  Abdominal: Soft. Bowel sounds are normal. She exhibits no distension. There is no tenderness. There is no rebound and no guarding.  Musculoskeletal: She exhibits no edema or tenderness.  Neurological: She is alert. She has normal strength. No cranial nerve deficit (no facial droop, extraocular movements intact, no slurred speech) or sensory deficit. She exhibits normal muscle tone. She displays no seizure activity. Coordination normal.  Skin: Skin is warm and dry. No rash noted.  Psychiatric: She has a normal mood and affect.  Nursing note and vitals reviewed.    ED Treatments / Results  Labs (all labs ordered are listed, but only abnormal results are displayed) Labs Reviewed  BASIC METABOLIC PANEL - Abnormal; Notable for the following components:      Result Value   Glucose, Bld 298 (*)    Calcium 8.8 (*)    All other components within normal limits  CBC - Abnormal; Notable for the following components:   WBC 13.9 (*)    Hemoglobin 11.9 (*)    All other components within normal limits  D-DIMER, QUANTITATIVE (NOT AT [facility]) - Abnormal; Notable for the following components:   D-Dimer, Quant 0.98 (*)    All other components within normal limits   I-STAT TROPONIN, ED    EKG  EKG Interpretation None       Radiology Ct Angio Chest Pe W And/or Wo Contrast  Result Date: 06/04/2017 CLINICAL DATA:  Patient with chest pain under the left breast. Shortness of breath. EXAM: CT ANGIOGRAPHY CHEST WITH CONTRAST TECHNIQUE: Multidetector CT imaging of the chest was performed using the standard protocol during bolus administration of intravenous contrast. Multiplanar CT image reconstructions and MIPs were obtained to evaluate the vascular anatomy. CONTRAST:  100 cc Isovue 370 COMPARISON:  Chest radiograph 06/04/2007. FINDINGS: Cardiovascular: Heart is enlarged. No pericardial effusion. Motion and quantum mottle artifact limits evaluation. There is suggestion of a tiny filling defect within a distal subsegmental right lower lobe pulmonary artery (image 305; series 9). Additionally there is a right lower lobe subsegmental pulmonary artery which is not opacified (image 240; series 9). The lingular artery is poorly opacified on current exam and not adequately assessed, predominately secondary to motion artifact. Mediastinum/Nodes: No enlarged axillary, mediastinal or hilar lymphadenopathy. Normal esophagus. Lungs/Pleura: Central airways are patent. Dependent atelectasis within the left lower lobe. No pleural effusion or  pneumothorax. Upper Abdomen: No acute process. Musculoskeletal: No aggressive or acute appearing osseous lesions. Review of the MIP images confirms the above findings. IMPRESSION: Examination limited secondary to motion and quantum mottle artifact. There is suggestion of a tiny filling defect within a distal right lower lobe subsegmental pulmonary artery. An additional right lower lobe subsegmental pulmonary artery is non-opacified. Findings may be secondary to artifact. Tiny distal embolus is not excluded. Additionally the lingular artery is not adequately assessed secondary to motion artifact. Patchy left lower lobe atelectasis. Electronically  Signed   By: Lovey Newcomer M.D.   On: 06/04/2017 21:29   Dg Chest Port 1 View  Result Date: 06/03/2017 CLINICAL DATA:  Worsening chest pain today. EXAM: PORTABLE CHEST 1 VIEW COMPARISON:  Radiographs 6 days prior 05/28/2017 FINDINGS: Stable cardiomegaly and mediastinal contours. Stable pulmonary vasculature. No consolidation, pleural fluid or pneumothorax. Grossly stable osseous structure. IMPRESSION: Stable cardiomegaly without acute abnormality. Electronically Signed   By: Jeb Levering M.D.   On: 06/03/2017 22:40    Procedures Procedures (including critical care time)  Medications Ordered in ED Medications  carvedilol (COREG) tablet 25 mg (25 mg Oral Given 06/04/17 1945)  hydrALAZINE (APRESOLINE) tablet 50 mg (50 mg Oral Given 06/04/17 1945)  hydrochlorothiazide (HYDRODIURIL) tablet 25 mg (25 mg Oral Given 06/04/17 1946)  fentaNYL (SUBLIMAZE) injection 50 mcg (50 mcg Intravenous Given 06/04/17 1847)  iopamidol (ISOVUE-370) 76 % injection (100 mLs  Contrast Given 06/04/17 2008)  fentaNYL (SUBLIMAZE) injection 50 mcg (50 mcg Intravenous Given 06/04/17 2124)     Initial Impression / Assessment and Plan / ED Course  I have reviewed the triage vital signs and the nursing notes.  Pertinent labs & imaging results that were available during my care of the patient were reviewed by me and considered in my medical decision making (see chart for details).  Clinical Course as of Jun 04 2136  Nancy Fetter Jun 04, 2017  1845 Pt presents with persistent, worsening, chest pain.   Hypertensive and tachycardic.  D dimer elevated. Will order CT angio of chest  [JK]  1934 BP elevated.  Pt states she did not take her medications this evening.  Will order her home BP meds  [JK]    Clinical Course User Index [JK] Dorie Rank, MD    Patient presented to the emergency room with worsening pleuritic chest pain.  Cardiac enzymes and EKG are reassuring.  Because of her persistent pleuritic chest pain d-dimer was performed.   This was elevated.  CT angios the chest shows possible small pulmonary embolisms.  However the study is affected by artifact.  It is difficult to say for sure that she does have a pulmonary embolism however the radiologist does see 2 different filling defects.  Patient is having persistent severe pain.  For this reason I will start her on heparin.  I will consult the medical service for admission.  Final Clinical Impressions(s) / ED Diagnoses   Final diagnoses:  Other acute pulmonary embolism without acute cor pulmonale ([place])      Dorie Rank, MD 06/04/17 2148

## 2017-06-04 NOTE — H&P (Addendum)
History and Physical    [name] NVB:[id number] DOB: 04-Jul-1971 DOA: 06/04/2017  PCP: Bartholome Bill, MD  Patient coming from: Home  I have personally briefly reviewed patient's old medical records in [place]  Chief Complaint: Chest pain  HPI: [name] is a 46 y.o. female with medical history significant of SLE, sjogrens, DM2, HTN, diastolic CHF.  Patient presents to the ED with c/o chest pain.  Sharp quality chest pain that's worse with breathing, coughing and movement.  Symptoms are worsening.  Onset yesterday.  Came in to ED yesterday and got sent home after neg trops, felt to be chest wall pain.  Symptoms worsened, back in ED.   ED Course: Trop neg, D.Dimer pos.  CTA chest shows filling defects: artifact vs questionable PE.  Started on heparin gtt.   Review of Systems: As per HPI otherwise 10 point review of systems negative.   Past Medical History:  Diagnosis Date  . Acute renal failure (HCC)     Intractable nausea vomiting secondary to diabetic gastroparesis causing dehydration and acute renal failure /notes 04/01/2013  . Anginal pain ([place])   . Anxiety   . Bell's palsy 08/09/2010  . CAP (community acquired pneumonia) 10/2011   Archie Endo 11/08/2011  . Chest pain 01/13/2014  . CHF (congestive heart failure) ([place])   . Diabetic gastroparesis associated with type 2 diabetes mellitus ([place])    this is presumed diagnoses, not confirmed by any studies.   . Eczema   . Family history of malignant neoplasm of breast   . Family history of malignant neoplasm of ovary   . GERD (gastroesophageal reflux disease)   . High cholesterol   . Hypertension   . Liver mass 2014   biopsied 03/2013 at [facility], not malignant.  is to undergo radiologic ablation of the mass in sept/October 2014.   . Lupus   . Migraines    "maybe a couple times/yr" (04/01/2013)  . Obesity   . Obstructive sleep apnea on CPAP 2011  . On home oxygen therapy    "2L 24/7" (02/28/2015)  . SJOGREN'S  SYNDROME 08/09/2010  . SLE (systemic lupus erythematosus) ([place])    Archie Endo 12/01/2010  . Stroke [facility]) 2010; 10/2012   "left side is still weak from it, never fully regained full strength; no additions from stroke 10/2012"    Past Surgical History:  Procedure Laterality Date  . BREAST CYST EXCISION Left 08/2005   epidermoid  . CARDIAC CATHETERIZATION  02/07/12  . ECTOPIC PREGNANCY SURGERY  1999  . HERNIA REPAIR    . LIVER BIOPSY  03/2013   liver mass/medical hx noted above  . MUSCLE BIOPSY     for lupus/notes 12/01/2010  . UMBILICAL HERNIA REPAIR  1980's     reports that she quit smoking about 27 years ago. Her smoking use included cigarettes. She quit after 3.00 years of use. she has never used smokeless tobacco. She reports that she does not drink alcohol or use drugs.  Allergies  Allergen Reactions  . Metoclopramide Other (See Comments)    Developed restless leg, akathisia type limb movements.   . Codeine Itching, Rash and Other (See Comments)  . Penicillins Itching    Has patient had a PCN reaction causing immediate rash, facial/tongue/throat swelling, SOB or lightheadedness with hypotension: No Has patient had a PCN reaction causing severe rash involving mucus membranes or skin necrosis: No Has patient had a PCN reaction that required hospitalization: No Has patient had a PCN reaction occurring  within the last 10 years:yes If all of the above answers are "NO", then may proceed with Cephalosporin use.  Marland Kitchen Hydrocodone Rash  . Percocet [Oxycodone-Acetaminophen] Hives and Rash    Tolerates dilaudid    Family History  Problem Relation Age of Onset  . Diabetes Mother   . Asthma Mother   . Urticaria Mother   . Breast cancer Maternal Aunt 105       currently 57  . Stomach cancer Maternal Uncle        dx 34s; deceased  . Prostate cancer Maternal Uncle        deceased 70s  . Cancer Maternal Aunt        unk. primary  . Cancer Maternal Aunt        unk. primary  . Ovarian cancer  Maternal Aunt        deceased 31s  . Hypertension Other   . Stroke Other   . Arthritis Other   . Ovarian cancer Sister 54       maternal half-sister; RAD51C positive  . Cancer Paternal Aunt        unk. primary; deceased 44s  . Prostate cancer Paternal Uncle        deceased 63s  . Breast cancer Cousin        deceased 15s; daughter of mat uncle with stomach ca  . Breast cancer Maternal Aunt        deceased 69s  . Asthma Brother   . Asthma Brother   . Allergic rhinitis Neg Hx   . Angioedema Neg Hx      Prior to Admission medications   Medication Sig Start Date End Date Taking? Authorizing Provider  albuterol (PROAIR HFA) 108 (90 BASE) MCG/ACT inhaler Inhale 2 puffs into the lungs every 6 (six) hours as needed for wheezing or shortness of breath.    Yes [provider]  albuterol (PROVENTIL) (5 MG/ML) 0.5% nebulizer solution Take 2.5 mg by nebulization every 6 (six) hours as needed for wheezing or shortness of breath.  06/24/14  Yes [provider]  alendronate (FOSAMAX) 70 MG tablet Take 70 mg by mouth every Saturday. Take with a full glass of water on an empty stomach.   Yes [provider]  ALPRAZolam Duanne Moron) 0.5 MG tablet Take 0.5 mg 2 (two) times daily as needed by mouth for anxiety or sleep.  04/19/17  Yes [provider]  aspirin EC 81 MG tablet Take 81 mg by mouth daily.   Yes [provider]  azaTHIOprine (IMURAN) 50 MG tablet Take 100 mg by mouth 2 (two) times daily.    Yes [provider]  buPROPion (WELLBUTRIN) 100 MG tablet Take 100 mg by mouth every morning. 10/20/16  Yes [provider]  carvedilol (COREG) 25 MG tablet Take 25 mg by mouth 2 (two) times daily with a meal.   Yes [provider]  cetirizine (ZYRTEC) 10 MG tablet Take 10 mg by mouth daily.   Yes [provider]  clopidogrel (PLAVIX) 75 MG tablet Take 75 mg by mouth daily.   Yes [provider]  clotrimazole-betamethasone  (LOTRISONE) cream Apply 1 application daily as needed topically (itching/breakouts on feet).    Yes [provider]  cyclobenzaprine (FLEXERIL) 10 MG tablet Take 10 mg 2 (two) times daily as needed by mouth.  04/19/17  Yes [provider]  cycloSPORINE (RESTASIS) 0.05 % ophthalmic emulsion Place 1 drop into both eyes 3 (three) times daily.   Yes  [provider]  diclofenac sodium (VOLTAREN) 1 % GEL Apply 2-4 g 2 (two) times daily as needed topically (joint pain).    Yes [provider]  doxepin (SINEQUAN) 50 MG capsule Take 50-100 mg by mouth at bedtime as needed (for sleep).   Yes [provider]  etonogestrel-ethinyl estradiol (NUVARING) 0.12-0.015 MG/24HR vaginal ring Place 1 each every 28 (twenty-eight) days vaginally. Insert vaginally and leave in place for 3 consecutive weeks, then remove for 1 week.   Yes [provider]  famotidine (PEPCID) 20 MG tablet Take 20 mg by mouth 2 (two) times daily.   Yes [provider]  hydrALAZINE (APRESOLINE) 50 MG tablet Take 50 mg by mouth 3 (three) times daily.   Yes [provider]  hydrochlorothiazide (HYDRODIURIL) 25 MG tablet Take 25 mg by mouth daily.   Yes [provider]  hydroxychloroquine (PLAQUENIL) 200 MG tablet Take 200 mg by mouth 2 (two) times daily.    Yes [provider]  insulin regular human CONCENTRATED (HUMULIN R) 500 UNIT/ML injection 3 times a day (just before each meal) 90-110-110 units. Patient taking differently: Inject 90-110 Units See admin instructions into the skin. Inject 90 units subcutaneously before breakfast, 110 units before lunch and supper (drawn up with Humulin R syringe) 12/30/16  Yes Renato Shin, MD  megestrol (MEGACE) 40 MG tablet Take 40 mg by mouth daily. 01/30/15  Yes [provider]  metFORMIN (GLUCOPHAGE-XR) 500 MG 24 hr tablet Take 1,000 mg by mouth 2 (two) times daily.   Yes [provider]    mometasone-formoterol (DULERA) 100-5 MCG/ACT AERO Inhale 2 puffs into the lungs 2 (two) times daily. 05/29/17  Yes [provider]  Olopatadine HCl (PAZEO) 0.7 % SOLN Place 1 drop daily as needed into both eyes (itching/irritation). Pazeo   Yes [provider]  Polyvinyl Alcohol-Povidone (REFRESH OP) Place 1 drop 2 (two) times daily into both eyes.    Yes [provider]  potassium chloride SA (K-DUR,KLOR-CON) 20 MEQ tablet Take 20 mEq by mouth 2 (two) times daily.   Yes [provider]  pravastatin (PRAVACHOL) 10 MG tablet Take 10 mg by mouth daily.   Yes [provider]  predniSONE (DELTASONE) 1 MG tablet Take 4 mg by mouth daily. 04/21/17  Yes [provider]  pregabalin (LYRICA) 75 MG capsule Take 75 mg 3 (three) times daily by mouth.  01/28/15  Yes [provider]  torsemide (DEMADEX) 20 MG tablet Take 1 tablet (20 mg total) by mouth 2 (two) times daily. 04/06/13  Yes Regalado, Belkys A, MD  exenatide (BYETTA) 5 MCG/0.02ML SOPN injection Inject 0.02 mLs (5 mcg total) into the skin 2 (two) times daily with a meal. 05/30/17   Renato Shin, MD  lidocaine (LIDODERM) 5 % Place 1 patch onto the skin daily. Remove & Discard patch within 12 hours or as directed by MD 05/28/17   Franchot Heidelberg, PA-C    Physical Exam: Vitals:   06/04/17 2030 06/04/17 2100 06/04/17 2130 06/04/17 2200  BP: (!) 144/73 134/61 (!) 115/58 130/67  Pulse: (!) 102 99 88 89  Resp: 13  16 (!) 21  Temp:      TempSrc:      SpO2: 100% 100% 98% 98%    Constitutional: NAD, calm, comfortable Eyes: PERRL, lids and conjunctivae normal ENMT: Mucous membranes are moist. Posterior pharynx clear of any exudate or lesions.Normal dentition.  Neck: normal, supple, no masses, no thyromegaly Respiratory: clear to auscultation bilaterally, no  wheezing, no crackles. Normal respiratory effort. No accessory muscle use.  Cardiovascular: Regular rate and rhythm, no murmurs / rubs  / gallops. No extremity edema. 2+ pedal pulses. No carotid bruits.  Abdomen: no tenderness, no masses palpated. No hepatosplenomegaly. Bowel sounds positive.  Musculoskeletal: no clubbing / cyanosis. No joint deformity upper and lower extremities. Good ROM, no contractures. Normal muscle tone.  Skin: no rashes, lesions, ulcers. No induration Neurologic: CN 2-12 grossly intact. Sensation intact, DTR normal. Strength 5/5 in all 4.  Psychiatric: Normal judgment and insight. Alert and oriented x 3. Normal mood.    Labs on Admission: I have personally reviewed following labs and imaging studies  CBC: Recent Labs  Lab 06/03/17 2225 06/04/17 1634  WBC 10.7* 13.9*  HGB 11.9* 11.9*  HCT 36.6 37.8  MCV 87.8 87.9  PLT 292 735   Basic Metabolic Panel: Recent Labs  Lab 06/03/17 2225 06/04/17 1634  NA 132* 137  K 3.6 4.1  CL 102 103  CO2 22 24  GLUCOSE 290* 298*  BUN 12 9  CREATININE 1.03* 0.90  CALCIUM 8.6* 8.8*   GFR: Estimated Creatinine Clearance: 128.7 mL/min (by C-G formula based on SCr of 0.9 mg/dL). Liver Function Tests: Recent Labs  Lab 06/03/17 2225  AST 22  ALT 15  ALKPHOS 95  BILITOT 0.2*  PROT 6.6  ALBUMIN 2.7*   No results for input(s): LIPASE, AMYLASE in the last 168 hours. No results for input(s): AMMONIA in the last 168 hours. Coagulation Profile: No results for input(s): INR, PROTIME in the last 168 hours. Cardiac Enzymes: Recent Labs  Lab 06/04/17 0014  TROPONINI <0.03   BNP (last 3 results) No results for input(s): PROBNP in the last 8760 hours. HbA1C: No results for input(s): HGBA1C in the last 72 hours. CBG: No results for input(s): GLUCAP in the last 168 hours. Lipid Profile: No results for input(s): CHOL, HDL, LDLCALC, TRIG, CHOLHDL, LDLDIRECT in the last 72 hours. Thyroid Function Tests: No results for input(s): TSH, T4TOTAL, FREET4, T3FREE, THYROIDAB in the last 72 hours. Anemia Panel: No results for input(s): VITAMINB12, FOLATE,  FERRITIN, TIBC, IRON, RETICCTPCT in the last 72 hours. Urine analysis:    Component Value Date/Time   COLORURINE YELLOW 05/28/2017 1229   APPEARANCEUR CLEAR 05/28/2017 1229   LABSPEC 1.026 05/28/2017 1229   PHURINE 6.0 05/28/2017 1229   GLUCOSEU >=500 (A) 05/28/2017 1229   HGBUR MODERATE (A) 05/28/2017 [place] 05/28/2017 [place] 05/28/2017 1229   PROTEINUR NEGATIVE 05/28/2017 1229   UROBILINOGEN 0.2 02/27/2015 1733   NITRITE NEGATIVE 05/28/2017 1229   LEUKOCYTESUR MODERATE (A) 05/28/2017 1229    Radiological Exams on Admission: Ct Angio Chest Pe W And/or Wo Contrast  Result Date: 06/04/2017 CLINICAL DATA:  Patient with chest pain under the left breast. Shortness of breath. EXAM: CT ANGIOGRAPHY CHEST WITH CONTRAST TECHNIQUE: Multidetector CT imaging of the chest was performed using the standard protocol during bolus administration of intravenous contrast. Multiplanar CT image reconstructions and MIPs were obtained to evaluate the vascular anatomy. CONTRAST:  100 cc Isovue 370 COMPARISON:  Chest radiograph 06/04/2007. FINDINGS: Cardiovascular: Heart is enlarged. No pericardial effusion. Motion and quantum mottle artifact limits evaluation. There is suggestion of a tiny filling defect within a distal subsegmental right lower lobe pulmonary artery (image 305; series 9). Additionally there is a right lower lobe subsegmental pulmonary artery which is not opacified (image 240; series 9). The lingular artery is poorly opacified on current exam and not  adequately assessed, predominately secondary to motion artifact. Mediastinum/Nodes: No enlarged axillary, mediastinal or hilar lymphadenopathy. Normal esophagus. Lungs/Pleura: Central airways are patent. Dependent atelectasis within the left lower lobe. No pleural effusion or pneumothorax. Upper Abdomen: No acute process. Musculoskeletal: No aggressive or acute appearing osseous lesions. Review of the MIP images  confirms the above findings. IMPRESSION: Examination limited secondary to motion and quantum mottle artifact. There is suggestion of a tiny filling defect within a distal right lower lobe subsegmental pulmonary artery. An additional right lower lobe subsegmental pulmonary artery is non-opacified. Findings may be secondary to artifact. Tiny distal embolus is not excluded. Additionally the lingular artery is not adequately assessed secondary to motion artifact. Patchy left lower lobe atelectasis. Electronically Signed   By: Lovey Newcomer M.D.   On: 06/04/2017 21:29   Dg Chest Port 1 View  Result Date: 06/03/2017 CLINICAL DATA:  Worsening chest pain today. EXAM: PORTABLE CHEST 1 VIEW COMPARISON:  Radiographs 6 days prior 05/28/2017 FINDINGS: Stable cardiomegaly and mediastinal contours. Stable pulmonary vasculature. No consolidation, pleural fluid or pneumothorax. Grossly stable osseous structure. IMPRESSION: Stable cardiomegaly without acute abnormality. Electronically Signed   By: Jeb Levering M.D.   On: 06/03/2017 22:40    EKG: Independently reviewed.  Assessment/Plan Principal Problem:   Chest pain Active Problems:   SLE   HTN (hypertension)   Chronic diastolic CHF (congestive heart failure) (HCC)   Morbid obesity (HCC)   Diabetes ([place])    1. CP - question PE 1. Serial trops 2. Tele monitor 3. BLE venous duplex 4. VQ scan in AM 5. 2d echo 6. Empiric heparin gtt, holding plavix while on heparin gtt 7. If VTE work up negative, may wish to pursue cardiac work up.  Could be crescendo angina and she does have risk factors, but the pleuritic nature of CP seems somewhat less likely. 2. SLE - continue home prednisone 3. HTN - continue home BP meds 4. DM2 - 1. Hold home meds 2. Resistant scale SSI Q4H while NPO 3. May even require more than just this given that she is on 310 units of insulin a day it seems.  No basal however. 5. Morbid obesity - chronic, stable  DVT prophylaxis: Heparin  gtt Code Status: Full Family Communication: Family at bedside Disposition Plan: Home after admit Consults called: None Admission status: Place in [place], [place] Hospitalists Pager [phone]  If 7AM-7PM, please contact day team taking care of patient [email or web address] Password [facility]  06/04/2017, 10:36 PM

## 2017-06-05 ENCOUNTER — Observation Stay (HOSPITAL_COMMUNITY): Payer: Medicaid Other

## 2017-06-05 ENCOUNTER — Observation Stay (HOSPITAL_BASED_OUTPATIENT_CLINIC_OR_DEPARTMENT_OTHER): Payer: Medicaid Other

## 2017-06-05 DIAGNOSIS — G4733 Obstructive sleep apnea (adult) (pediatric): Secondary | ICD-10-CM | POA: Diagnosis present

## 2017-06-05 DIAGNOSIS — K219 Gastro-esophageal reflux disease without esophagitis: Secondary | ICD-10-CM | POA: Diagnosis present

## 2017-06-05 DIAGNOSIS — I5032 Chronic diastolic (congestive) heart failure: Secondary | ICD-10-CM | POA: Diagnosis present

## 2017-06-05 DIAGNOSIS — I10 Essential (primary) hypertension: Secondary | ICD-10-CM | POA: Diagnosis not present

## 2017-06-05 DIAGNOSIS — M329 Systemic lupus erythematosus, unspecified: Secondary | ICD-10-CM

## 2017-06-05 DIAGNOSIS — Z8249 Family history of ischemic heart disease and other diseases of the circulatory system: Secondary | ICD-10-CM | POA: Diagnosis not present

## 2017-06-05 DIAGNOSIS — E662 Morbid (severe) obesity with alveolar hypoventilation: Secondary | ICD-10-CM | POA: Diagnosis present

## 2017-06-05 DIAGNOSIS — E1165 Type 2 diabetes mellitus with hyperglycemia: Secondary | ICD-10-CM | POA: Diagnosis present

## 2017-06-05 DIAGNOSIS — R0602 Shortness of breath: Secondary | ICD-10-CM | POA: Diagnosis not present

## 2017-06-05 DIAGNOSIS — R072 Precordial pain: Secondary | ICD-10-CM | POA: Diagnosis not present

## 2017-06-05 DIAGNOSIS — E1143 Type 2 diabetes mellitus with diabetic autonomic (poly)neuropathy: Secondary | ICD-10-CM

## 2017-06-05 DIAGNOSIS — Z794 Long term (current) use of insulin: Secondary | ICD-10-CM

## 2017-06-05 DIAGNOSIS — Z9981 Dependence on supplemental oxygen: Secondary | ICD-10-CM | POA: Diagnosis not present

## 2017-06-05 DIAGNOSIS — Z87891 Personal history of nicotine dependence: Secondary | ICD-10-CM | POA: Diagnosis not present

## 2017-06-05 DIAGNOSIS — Z833 Family history of diabetes mellitus: Secondary | ICD-10-CM | POA: Diagnosis not present

## 2017-06-05 DIAGNOSIS — I2699 Other pulmonary embolism without acute cor pulmonale: Secondary | ICD-10-CM | POA: Diagnosis present

## 2017-06-05 DIAGNOSIS — Z7983 Long term (current) use of bisphosphonates: Secondary | ICD-10-CM | POA: Diagnosis not present

## 2017-06-05 DIAGNOSIS — I110 Hypertensive heart disease with heart failure: Secondary | ICD-10-CM | POA: Diagnosis present

## 2017-06-05 DIAGNOSIS — M940 Chondrocostal junction syndrome [Tietze]: Secondary | ICD-10-CM | POA: Diagnosis not present

## 2017-06-05 DIAGNOSIS — I69354 Hemiplegia and hemiparesis following cerebral infarction affecting left non-dominant side: Secondary | ICD-10-CM | POA: Diagnosis not present

## 2017-06-05 DIAGNOSIS — Z825 Family history of asthma and other chronic lower respiratory diseases: Secondary | ICD-10-CM | POA: Diagnosis not present

## 2017-06-05 DIAGNOSIS — Z008 Encounter for other general examination: Secondary | ICD-10-CM | POA: Diagnosis not present

## 2017-06-05 DIAGNOSIS — Z803 Family history of malignant neoplasm of breast: Secondary | ICD-10-CM | POA: Diagnosis not present

## 2017-06-05 DIAGNOSIS — Z6841 Body Mass Index (BMI) 40.0 and over, adult: Secondary | ICD-10-CM | POA: Diagnosis not present

## 2017-06-05 DIAGNOSIS — Z8041 Family history of malignant neoplasm of ovary: Secondary | ICD-10-CM | POA: Diagnosis not present

## 2017-06-05 DIAGNOSIS — Z7982 Long term (current) use of aspirin: Secondary | ICD-10-CM | POA: Diagnosis not present

## 2017-06-05 DIAGNOSIS — Z7952 Long term (current) use of systemic steroids: Secondary | ICD-10-CM | POA: Diagnosis not present

## 2017-06-05 DIAGNOSIS — R0789 Other chest pain: Secondary | ICD-10-CM | POA: Diagnosis not present

## 2017-06-05 DIAGNOSIS — L309 Dermatitis, unspecified: Secondary | ICD-10-CM | POA: Diagnosis present

## 2017-06-05 DIAGNOSIS — Z800 Family history of malignant neoplasm of digestive organs: Secondary | ICD-10-CM | POA: Diagnosis not present

## 2017-06-05 DIAGNOSIS — Z7902 Long term (current) use of antithrombotics/antiplatelets: Secondary | ICD-10-CM | POA: Diagnosis not present

## 2017-06-05 LAB — CBC
HCT: 34.6 % — ABNORMAL LOW (ref 36.0–46.0)
Hemoglobin: 11.2 g/dL — ABNORMAL LOW (ref 12.0–15.0)
MCH: 28 pg (ref 26.0–34.0)
MCHC: 32.4 g/dL (ref 30.0–36.0)
MCV: 86.5 fL (ref 78.0–100.0)
PLATELETS: 260 10*3/uL (ref 150–400)
RBC: 4 MIL/uL (ref 3.87–5.11)
RDW: 14 % (ref 11.5–15.5)
WBC: 12.3 10*3/uL — AB (ref 4.0–10.5)

## 2017-06-05 LAB — MRSA PCR SCREENING: MRSA BY PCR: NEGATIVE

## 2017-06-05 LAB — GLUCOSE, CAPILLARY
GLUCOSE-CAPILLARY: 257 mg/dL — AB (ref 65–99)
Glucose-Capillary: 277 mg/dL — ABNORMAL HIGH (ref 65–99)
Glucose-Capillary: 246 mg/dL — ABNORMAL HIGH (ref 65–99)
Glucose-Capillary: 337 mg/dL — ABNORMAL HIGH (ref 65–99)

## 2017-06-05 LAB — TROPONIN I
Troponin I: <0.03 ng/mL (ref <0.03)
Troponin I: <0.03 ng/mL (ref <0.03)

## 2017-06-05 LAB — ECHOCARDIOGRAM COMPLETE

## 2017-06-05 LAB — HEPARIN LEVEL (UNFRACTIONATED)
HEPARIN UNFRACTIONATED: 0.18 [IU]/mL — AB (ref 0.30–0.70)
HEPARIN UNFRACTIONATED: 0.41 [IU]/mL (ref 0.30–0.70)
HEPARIN UNFRACTIONATED: 0.37 [IU]/mL (ref 0.30–0.70)

## 2017-06-05 LAB — HIV ANTIBODY (ROUTINE TESTING W REFLEX): HIV Screen 4th Generation wRfx: NONREACTIVE

## 2017-06-05 LAB — MAGNESIUM: MAGNESIUM: 1.7 mg/dL (ref 1.7–2.4)

## 2017-06-05 MED ORDER — DICLOFENAC SODIUM 1 % TD GEL
2.0000 g | Freq: Four times a day (QID) | TRANSDERMAL | Status: DC
Start: 2017-06-05 — End: 2017-06-05

## 2017-06-05 MED ORDER — HEPARIN BOLUS VIA INFUSION
4000.0000 [IU] | Freq: Once | INTRAVENOUS | Status: AC
  Administered 2017-06-05: 4000 [IU] via INTRAVENOUS
  Filled 2017-06-05: qty 4000

## 2017-06-05 MED ORDER — MORPHINE SULFATE 15 MG PO TABS
15.0000 mg | ORAL_TABLET | Freq: Four times a day (QID) | ORAL | Status: DC | PRN
  Administered 2017-06-05 – 2017-06-06 (×3): 15 mg via ORAL
  Filled 2017-06-05 (×5): qty 1

## 2017-06-05 MED ORDER — FENTANYL CITRATE (PF) 100 MCG/2ML IJ SOLN: 25.0000 ug | INTRAMUSCULAR | Status: DC | PRN

## 2017-06-05 MED ORDER — TECHNETIUM TC 99M DIETHYLENETRIAME-PENTAACETIC ACID
30.0000 | Freq: Once | INTRAVENOUS | Status: AC | PRN
  Administered 2017-06-05: 30 via RESPIRATORY_TRACT

## 2017-06-05 MED ORDER — INSULIN DETEMIR 100 UNIT/ML ~~LOC~~ SOLN
25.0000 [IU] | Freq: Two times a day (BID) | SUBCUTANEOUS | Status: DC
  Administered 2017-06-05 – 2017-06-06 (×3): 25 [IU] via SUBCUTANEOUS
  Filled 2017-06-05 (×4): qty 0.25

## 2017-06-05 MED ORDER — LOSARTAN POTASSIUM 50 MG PO TABS: 50.0000 mg | ORAL_TABLET | Freq: Every day | ORAL | Status: DC

## 2017-06-05 MED ORDER — HYDROCODONE-ACETAMINOPHEN 7.5-325 MG PO TABS
1.0000 | ORAL_TABLET | Freq: Four times a day (QID) | ORAL | Status: DC | PRN
  Filled 2017-06-05: qty 1

## 2017-06-05 MED ORDER — TECHNETIUM TO 99M ALBUMIN AGGREGATED
4.0000 | Freq: Once | INTRAVENOUS | Status: AC | PRN
  Administered 2017-06-05: 4 via INTRAVENOUS

## 2017-06-05 MED ORDER — DICLOFENAC SODIUM 1 % TD GEL
2.0000 g | Freq: Four times a day (QID) | TRANSDERMAL | Status: DC | PRN
  Administered 2017-06-06: 12:00:00 2 g via TOPICAL
  Filled 2017-06-05: qty 100

## 2017-06-05 MED ORDER — INSULIN ASPART 100 UNIT/ML ~~LOC~~ SOLN
0.0000 [IU] | Freq: Every day | SUBCUTANEOUS | Status: DC
  Administered 2017-06-05: 3 [IU] via SUBCUTANEOUS

## 2017-06-05 MED ORDER — TECHNETIUM TC 99M DIETHYLENETRIAME-PENTAACETIC ACID: 30.0000 | Freq: Once | INTRAVENOUS | Status: DC | PRN

## 2017-06-05 MED ORDER — INSULIN ASPART 100 UNIT/ML ~~LOC~~ SOLN
0.0000 [IU] | Freq: Three times a day (TID) | SUBCUTANEOUS | Status: DC
  Administered 2017-06-05: 15 [IU] via SUBCUTANEOUS
  Administered 2017-06-06: 7 [IU] via SUBCUTANEOUS
  Administered 2017-06-06: 11 [IU] via SUBCUTANEOUS

## 2017-06-05 NOTE — Progress Notes (Signed)
ANTICOAGULATION CONSULT NOTE - Follow Up Consult  Pharmacy Consult for heparin Indication: possible PE  Labs: Recent Labs    06/03/17 2225  06/04/17 1634 06/04/17 2336 06/05/17 0409 06/05/17 1110 06/05/17 1518 06/05/17 2012  HGB 11.9*  --  11.9*  --  11.2*  --   --   --   HCT 36.6  --  37.8  --  34.6*  --   --   --   PLT 292  --  281  --  260  --   --   --   HEPARINUNFRC  --   --   --   --  0.18* 0.41  --  0.37  CREATININE 1.03*  --  0.90  --   --   --   --   --   TROPONINI  --    < >  --  <0.03 <0.03  --  <0.03  --    < > = values in this interval not displayed.    Assessment: 72 YOF who continues on Heparin for possible PE.   Heparin level this evening remains therapeutic (HL 0.37, goal of 0.3-0.7). CBC okay from AM labs. No bleeding noted at this time.   Goal of Therapy:  Heparin level 0.3-0.7 units/ml  Monitor platelets while on heparin therapy.   Plan:  1. Continue heparin at 2100 units/hr 2. Daily CBC, heparin level 3. Will continue to monitor for any signs/symptoms of bleeding and will follow up with heparin level in the a.m.   Thank you for allowing pharmacy to be a part of this patient's care.  Alycia Rossetti, PharmD, BCPS Clinical Pharmacist Pager: [phone] If after 3:30p, please call main pharmacy at: [phone] 06/05/2017 9:15 PM

## 2017-06-05 NOTE — Progress Notes (Signed)
  Echocardiogram 2D Echocardiogram has been performed.  [name] 06/05/2017, 11:11 AM

## 2017-06-05 NOTE — Progress Notes (Signed)
PROGRESS NOTE    [name]  OJJ:[id number] DOB: 07/31/1971 DOA: 06/04/2017 PCP: Bartholome Bill, MD   Outpatient Specialists:     Brief Narrative:  [name] is a 46 y.o. female with medical history significant of SLE, sjogrens, DM2, HTN, diastolic CHF.  Patient presents to the ED with c/o chest pain.  Sharp quality chest pain that's worse with breathing, coughing and movement.  Symptoms are worsening.  Onset yesterday.  Came in to ED yesterday and got sent home after neg trops, felt to be chest wall pain.  Symptoms worsened, back in ED.   Assessment & Plan:   Principal Problem:   Chest pain Active Problems:   SLE   HTN (hypertension)   Chronic diastolic CHF (congestive heart failure) (HCC)   Morbid obesity (HCC)   Diabetes (HCC)   CP - questionable  PE: HBZ:JIRC filling defect within a distal right lower lobe subsegmental pulmonary artery.          -reproducible Serial trops negative Tele monitor- few PVCs BLE venous duplex- negative VQ scan pending 2d echo: Compared to a prior echo in 2015, the LVEF is lower at 45-50%-- will discuss with Dr. Einar Gip her cardiologist Empiric heparin gtt, holding plavix while on heparin gtt  OSA      CPAP  SLE - continue home prednisone  HTN - continue home BP meds  DM2 - -SSI -add levemir BID per diabetic coordinator  Morbid obesity There is no height or weight on file to calculate BMI.     DVT prophylaxis:  Fully anticoagulated   Code Status: Full Code   Family Communication:   Disposition Plan:     Consultants:     Procedures:    Subjective: C/o pain worse with palpation Has been less ambulatory since she hurt her right knee -has been out of pain meds-- has appointment on 9th with new pain management  Objective: Vitals:   06/05/17 0612 06/05/17 0701 06/05/17 0914 06/05/17 1200  BP:  (!) 175/101    Pulse:  98    Resp:      Temp:  98.6 F (37 C)  98.1 F (36.7 C)  TempSrc:  Oral  Oral    SpO2: 99% 98% 100%    No intake or output data in the 24 hours ending 06/05/17 1303 There were no vitals filed for this visit.  Examination:  General exam: morbidly obese Respiratory system: Clear to auscultation. Respiratory effort normal, chest wall tender to palpation Cardiovascular system: S1 & S2 heard, RRR. No JVD, murmurs, rubs, gallops or clicks. No pedal edema. Gastrointestinal system: Abdomen is nondistended, soft and nontender. No organomegaly or masses felt. Normal bowel sounds heard. Central nervous system: Alert and oriented. No focal neurological deficits. Extremities: +edema Skin: No rashes, lesions or ulcers Psychiatry: Judgement and insight appear normal. Mood & affect appropriate.     Data Reviewed: I have personally reviewed following labs and imaging studies  CBC: Recent Labs  Lab 06/03/17 2225 06/04/17 1634 06/05/17 0409  WBC 10.7* 13.9* 12.3*  HGB 11.9* 11.9* 11.2*  HCT 36.6 37.8 34.6*  MCV 87.8 87.9 86.5  PLT 292 281 789   Basic Metabolic Panel: Recent Labs  Lab 06/03/17 2225 06/04/17 1634  NA 132* 137  K 3.6 4.1  CL 102 103  CO2 22 24  GLUCOSE 290* 298*  BUN 12 9  CREATININE 1.03* 0.90  CALCIUM 8.6* 8.8*   GFR: Estimated Creatinine Clearance: 128.7 mL/min (by C-G formula based on SCr of  0.9 mg/dL). Liver Function Tests: Recent Labs  Lab 06/03/17 2225  AST 22  ALT 15  ALKPHOS 95  BILITOT 0.2*  PROT 6.6  ALBUMIN 2.7*   No results for input(s): LIPASE, AMYLASE in the last 168 hours. No results for input(s): AMMONIA in the last 168 hours. Coagulation Profile: No results for input(s): INR, PROTIME in the last 168 hours. Cardiac Enzymes: Recent Labs  Lab 06/04/17 0014 06/04/17 2336 06/05/17 0409  TROPONINI <0.03 <0.03 <0.03   BNP (last 3 results) No results for input(s): PROBNP in the last 8760 hours. HbA1C: No results for input(s): HGBA1C in the last 72 hours. CBG: Recent Labs  Lab 06/05/17 0810 06/05/17 1159   GLUCAP 277* 246*   Lipid Profile: No results for input(s): CHOL, HDL, LDLCALC, TRIG, CHOLHDL, LDLDIRECT in the last 72 hours. Thyroid Function Tests: No results for input(s): TSH, T4TOTAL, FREET4, T3FREE, THYROIDAB in the last 72 hours. Anemia Panel: No results for input(s): VITAMINB12, FOLATE, FERRITIN, TIBC, IRON, RETICCTPCT in the last 72 hours. Urine analysis:    Component Value Date/Time   COLORURINE YELLOW 05/28/2017 1229   APPEARANCEUR CLEAR 05/28/2017 1229   LABSPEC 1.026 05/28/2017 1229   PHURINE 6.0 05/28/2017 1229   GLUCOSEU >=500 (A) 05/28/2017 1229   HGBUR MODERATE (A) 05/28/2017 1229   BILIRUBINUR NEGATIVE 05/28/2017 [place] 05/28/2017 1229   PROTEINUR NEGATIVE 05/28/2017 1229   UROBILINOGEN 0.2 02/27/2015 1733   NITRITE NEGATIVE 05/28/2017 1229   LEUKOCYTESUR MODERATE (A) 05/28/2017 1229     )No results found for this or any previous visit (from the past 240 hour(s)).    Anti-infectives (From admission, onward)   Start     Dose/Rate Route Frequency Ordered Stop   06/05/17 0100  hydroxychloroquine (PLAQUENIL) tablet 200 mg     200 mg Oral 2 times daily 06/04/17 2230         Radiology Studies: Ct Angio Chest Pe W And/or Wo Contrast  Result Date: 06/04/2017 CLINICAL DATA:  Patient with chest pain under the left breast. Shortness of breath. EXAM: CT ANGIOGRAPHY CHEST WITH CONTRAST TECHNIQUE: Multidetector CT imaging of the chest was performed using the standard protocol during bolus administration of intravenous contrast. Multiplanar CT image reconstructions and MIPs were obtained to evaluate the vascular anatomy. CONTRAST:  100 cc Isovue 370 COMPARISON:  Chest radiograph 06/04/2007. FINDINGS: Cardiovascular: Heart is enlarged. No pericardial effusion. Motion and quantum mottle artifact limits evaluation. There is suggestion of a tiny filling defect within a distal subsegmental right lower lobe pulmonary artery (image 305; series 9). Additionally  there is a right lower lobe subsegmental pulmonary artery which is not opacified (image 240; series 9). The lingular artery is poorly opacified on current exam and not adequately assessed, predominately secondary to motion artifact. Mediastinum/Nodes: No enlarged axillary, mediastinal or hilar lymphadenopathy. Normal esophagus. Lungs/Pleura: Central airways are patent. Dependent atelectasis within the left lower lobe. No pleural effusion or pneumothorax. Upper Abdomen: No acute process. Musculoskeletal: No aggressive or acute appearing osseous lesions. Review of the MIP images confirms the above findings. IMPRESSION: Examination limited secondary to motion and quantum mottle artifact. There is suggestion of a tiny filling defect within a distal right lower lobe subsegmental pulmonary artery. An additional right lower lobe subsegmental pulmonary artery is non-opacified. Findings may be secondary to artifact. Tiny distal embolus is not excluded. Additionally the lingular artery is not adequately assessed secondary to motion artifact. Patchy left lower lobe atelectasis. Electronically Signed   By: Polly Cobia.D.  On: 06/04/2017 21:29   Dg Chest Port 1 View  Result Date: 06/03/2017 CLINICAL DATA:  Worsening chest pain today. EXAM: PORTABLE CHEST 1 VIEW COMPARISON:  Radiographs 6 days prior 05/28/2017 FINDINGS: Stable cardiomegaly and mediastinal contours. Stable pulmonary vasculature. No consolidation, pleural fluid or pneumothorax. Grossly stable osseous structure. IMPRESSION: Stable cardiomegaly without acute abnormality. Electronically Signed   By: Jeb Levering M.D.   On: 06/03/2017 22:40        Scheduled Meds: . aspirin EC  81 mg Oral Daily  . azaTHIOprine  100 mg Oral BID  . buPROPion  100 mg Oral Daily  . carvedilol  25 mg Oral BID WC  . cycloSPORINE  1 drop Both Eyes TID  . famotidine  20 mg Oral BID  . hydrALAZINE  50 mg Oral TID  . hydrochlorothiazide  25 mg Oral Daily  .  hydroxychloroquine  200 mg Oral BID  . insulin aspart  0-20 Units Subcutaneous Q4H  . megestrol  40 mg Oral Daily  . mometasone-formoterol  2 puff Inhalation BID  . potassium chloride SA  20 mEq Oral BID  . pravastatin  10 mg Oral q1800  . predniSONE  4 mg Oral Daily  . pregabalin  75 mg Oral TID  . torsemide  20 mg Oral BID   Continuous Infusions: . heparin 2,100 Units/hr (06/05/17 0756)     LOS: 0 days    Time spent:35 min    Tali Coster U Kamica Florance, DO [facility] Pager [phone]  If 7PM-7AM, please contact night-coverage [email or web address] Password [email or web address] 06/05/2017, 1:03 PM

## 2017-06-05 NOTE — Progress Notes (Signed)
PO Morphine dose dropped on floor by pt; wasted with KR as a witness. New dose given to pt from pyxis.   [place]  Bryten Maher, RN

## 2017-06-05 NOTE — Consult Note (Signed)
Reason for Consult: Chest pain Referring Physician: Eulogio Bear, DO  [name] is an 46 y.o. female.  HPI:  [name] is a African-American female with morbid obesity, uncontrolled diabetes mellitus, history of SLE, obstructive sleep apnea on CPAP, previously on continuous home oxygen, no on nocturnal supplementation only, hypertension, chronic diastolic heart failure, admitted to the hospital with atypical chest pain, workup for PE has been negative.  I consulted to evaluate the patient due to underlying multiple medical risk factors for CAD.  Interestingly in in view of for morbid obesity, multiple cardiac risk factors, she had undergone cardiac catheterization in 2013 which had revealed normal coronary arteries and right heart catheterization did not reveal pulmonary hypertension. Chest pain started about a week ago and described as sharp pain in the left upper chest and difficult to breath or move as it hurts.   Past Medical History:  Diagnosis Date  . Acute renal failure (HCC)     Intractable nausea vomiting secondary to diabetic gastroparesis causing dehydration and acute renal failure /notes 04/01/2013  . Anginal pain ([place])   . Anxiety   . Bell's palsy 08/09/2010  . CAP (community acquired pneumonia) 10/2011   Archie Endo 11/08/2011  . Chest pain 01/13/2014  . CHF (congestive heart failure) ([place])   . Diabetic gastroparesis associated with type 2 diabetes mellitus ([place])    this is presumed diagnoses, not confirmed by any studies.   . Eczema   . Family history of malignant neoplasm of breast   . Family history of malignant neoplasm of ovary   . GERD (gastroesophageal reflux disease)   . High cholesterol   . Hypertension   . Liver mass 2014   biopsied 03/2013 at [facility], not malignant.  is to undergo radiologic ablation of the mass in sept/October 2014.   . Lupus   . Migraines    "maybe a couple times/yr" (04/01/2013)  . Obesity   . Obstructive sleep apnea on CPAP 2011  . On home  oxygen therapy    "2L 24/7" (02/28/2015)  . SJOGREN'S SYNDROME 08/09/2010  . SLE (systemic lupus erythematosus) ([place])    Archie Endo 12/01/2010  . Stroke [facility]) 2010; 10/2012   "left side is still weak from it, never fully regained full strength; no additions from stroke 10/2012"    Past Surgical History:  Procedure Laterality Date  . BREAST CYST EXCISION Left 08/2005   epidermoid  . CARDIAC CATHETERIZATION  02/07/12  . ECTOPIC PREGNANCY SURGERY  1999  . HERNIA REPAIR    . LIVER BIOPSY  03/2013   liver mass/medical hx noted above  . MUSCLE BIOPSY     for lupus/notes 12/01/2010  . UMBILICAL HERNIA REPAIR  1980's    Family History  Problem Relation Age of Onset  . Diabetes Mother   . Asthma Mother   . Urticaria Mother   . Breast cancer Maternal Aunt 42       currently 71  . Stomach cancer Maternal Uncle        dx 71s; deceased  . Prostate cancer Maternal Uncle        deceased 44s  . Cancer Maternal Aunt        unk. primary  . Cancer Maternal Aunt        unk. primary  . Ovarian cancer Maternal Aunt        deceased 17s  . Hypertension Other   . Stroke Other   . Arthritis Other   . Ovarian cancer Sister 51  maternal half-sister; RAD51C positive  . Cancer Paternal Aunt        unk. primary; deceased 52s  . Prostate cancer Paternal Uncle        deceased 44s  . Breast cancer Cousin        deceased 65s; daughter of mat uncle with stomach ca  . Breast cancer Maternal Aunt        deceased 24s  . Asthma Brother   . Asthma Brother   . Allergic rhinitis Neg Hx   . Angioedema Neg Hx     Social History:  reports that she quit smoking about 27 years ago. Her smoking use included cigarettes. She quit after 3.00 years of use. she has never used smokeless tobacco. She reports that she does not drink alcohol or use drugs.  Allergies:  Allergies  Allergen Reactions  . Metoclopramide Other (See Comments)    Developed restless leg, akathisia type limb movements.   . Codeine Itching, Rash  and Other (See Comments)  . Penicillins Itching    Has patient had a PCN reaction causing immediate rash, facial/tongue/throat swelling, SOB or lightheadedness with hypotension: No Has patient had a PCN reaction causing severe rash involving mucus membranes or skin necrosis: No Has patient had a PCN reaction that required hospitalization: No Has patient had a PCN reaction occurring within the last 10 years:yes If all of the above answers are "NO", then may proceed with Cephalosporin use.  Marland Kitchen Hydrocodone Rash  . Percocet [Oxycodone-Acetaminophen] Hives and Rash    Tolerates dilaudid     Results for orders placed or performed during the hospital encounter of 06/04/17 (from the past 48 hour(s))  Basic metabolic panel     Status: Abnormal   Collection Time: 06/04/17  4:34 PM  Result Value Ref Range   Sodium 137 135 - 145 mmol/L   Potassium 4.1 3.5 - 5.1 mmol/L   Chloride 103 101 - 111 mmol/L   CO2 24 22 - 32 mmol/L   Glucose, Bld 298 (H) 65 - 99 mg/dL   BUN 9 6 - 20 mg/dL   Creatinine, Ser 0.90 0.44 - 1.00 mg/dL   Calcium 8.8 (L) 8.9 - 10.3 mg/dL   GFR calc non Af Amer >60 >60 mL/min   GFR calc Af Amer >60 >60 mL/min    Comment: (NOTE) The eGFR has been calculated using the CKD EPI equation. This calculation has not been validated in all clinical situations. eGFR's persistently <60 mL/min signify possible Chronic Kidney Disease.    Anion gap 10 5 - 15  CBC     Status: Abnormal   Collection Time: 06/04/17  4:34 PM  Result Value Ref Range   WBC 13.9 (H) 4.0 - 10.5 K/uL   RBC 4.30 3.87 - 5.11 MIL/uL   Hemoglobin 11.9 (L) 12.0 - 15.0 g/dL   HCT 37.8 36.0 - 46.0 %   MCV 87.9 78.0 - 100.0 fL   MCH 27.7 26.0 - 34.0 pg   MCHC 31.5 30.0 - 36.0 g/dL   RDW 14.4 11.5 - 15.5 %   Platelets 281 150 - 400 K/uL  I-stat troponin, ED     Status: None   Collection Time: 06/04/17  4:51 PM  Result Value Ref Range   Troponin i, poc 0.02 0.00 - 0.08 ng/mL   Comment 3            Comment: Due to  the release kinetics of cTnI, a negative result within the first hours of the onset  of symptoms does not rule out myocardial infarction with certainty. If myocardial infarction is still suspected, repeat the test at appropriate intervals.   D-dimer, quantitative (not at [facility])     Status: Abnormal   Collection Time: 06/04/17  6:05 PM  Result Value Ref Range   D-Dimer, Quant 0.98 (H) 0.00 - 0.50 ug/mL-FEU    Comment: (NOTE) At the manufacturer cut-off of 0.50 ug/mL FEU, this assay has been documented to exclude PE with a sensitivity and negative predictive value of 97 to 99%.  At this time, this assay has not been approved by the FDA to exclude DVT/VTE. Results should be correlated with clinical presentation.   Troponin I (q 6hr x 3)     Status: None   Collection Time: 06/04/17 11:36 PM  Result Value Ref Range   Troponin I <0.03 <0.03 ng/mL  HIV antibody (Routine Testing)     Status: None   Collection Time: 06/04/17 11:36 PM  Result Value Ref Range   HIV Screen 4th Generation wRfx Non Reactive Non Reactive    Comment: (NOTE) Performed At: [facility] [place], [place] [id number] Rush Farmer MD BJ:[id number]   Heparin level (unfractionated)     Status: Abnormal   Collection Time: 06/05/17  4:09 AM  Result Value Ref Range   Heparin Unfractionated 0.18 (L) 0.30 - 0.70 IU/mL    Comment:        IF HEPARIN RESULTS ARE BELOW EXPECTED VALUES, AND PATIENT DOSAGE HAS BEEN CONFIRMED, SUGGEST FOLLOW UP TESTING OF ANTITHROMBIN III LEVELS.   CBC     Status: Abnormal   Collection Time: 06/05/17  4:09 AM  Result Value Ref Range   WBC 12.3 (H) 4.0 - 10.5 K/uL   RBC 4.00 3.87 - 5.11 MIL/uL   Hemoglobin 11.2 (L) 12.0 - 15.0 g/dL   HCT 34.6 (L) 36.0 - 46.0 %   MCV 86.5 78.0 - 100.0 fL   MCH 28.0 26.0 - 34.0 pg   MCHC 32.4 30.0 - 36.0 g/dL   RDW 14.0 11.5 - 15.5 %   Platelets 260 150 - 400 K/uL  Troponin I (q 6hr x 3)     Status: None   Collection Time:  06/05/17  4:09 AM  Result Value Ref Range   Troponin I <0.03 <0.03 ng/mL  MRSA PCR Screening     Status: None   Collection Time: 06/05/17  6:57 AM  Result Value Ref Range   MRSA by PCR NEGATIVE NEGATIVE    Comment:        The GeneXpert MRSA Assay (FDA approved for NASAL specimens only), is one component of a comprehensive MRSA colonization surveillance program. It is not intended to diagnose MRSA infection nor to guide or monitor treatment for MRSA infections. Performed at [facility], [place] [address], [place], [place] [address]   Glucose, capillary     Status: Abnormal   Collection Time: 06/05/17  8:10 AM  Result Value Ref Range   Glucose-Capillary 277 (H) 65 - 99 mg/dL   Comment 1 Notify RN    Comment 2 Document in Chart   Heparin level (unfractionated)     Status: None   Collection Time: 06/05/17 11:10 AM  Result Value Ref Range   Heparin Unfractionated 0.41 0.30 - 0.70 IU/mL    Comment:        IF HEPARIN RESULTS ARE BELOW EXPECTED VALUES, AND PATIENT DOSAGE HAS BEEN CONFIRMED, SUGGEST FOLLOW UP TESTING OF ANTITHROMBIN III LEVELS.  Glucose, capillary     Status: Abnormal   Collection Time: 06/05/17 11:59 AM  Result Value Ref Range   Glucose-Capillary 246 (H) 65 - 99 mg/dL  Magnesium     Status: None   Collection Time: 06/05/17  3:18 PM  Result Value Ref Range   Magnesium 1.7 1.7 - 2.4 mg/dL  Troponin I     Status: None   Collection Time: 06/05/17  3:18 PM  Result Value Ref Range   Troponin I <0.03 <0.03 ng/mL  Glucose, capillary     Status: Abnormal   Collection Time: 06/05/17  5:30 PM  Result Value Ref Range   Glucose-Capillary 337 (H) 65 - 99 mg/dL    Dg Chest 2 View  Result Date: 06/05/2017 CLINICAL DATA:  Pulmonary function testing. EXAM: CHEST  2 VIEW COMPARISON:  06/03/2017 FINDINGS: Lungs are adequately inflated without focal airspace consolidation or effusion. Mild prominence of the perihilar markings. Stable moderate  cardiomegaly. Remaining bones and soft tissues are unchanged. IMPRESSION: Moderate cardiomegaly with suggestion of minimal vascular congestion. Electronically Signed   By: Marin Olp M.D.   On: 06/05/2017 14:25   Ct Angio Chest Pe W And/or Wo Contrast  Result Date: 06/04/2017 CLINICAL DATA:  Patient with chest pain under the left breast. Shortness of breath. EXAM: CT ANGIOGRAPHY CHEST WITH CONTRAST TECHNIQUE: Multidetector CT imaging of the chest was performed using the standard protocol during bolus administration of intravenous contrast. Multiplanar CT image reconstructions and MIPs were obtained to evaluate the vascular anatomy. CONTRAST:  100 cc Isovue 370 COMPARISON:  Chest radiograph 06/04/2007. FINDINGS: Cardiovascular: Heart is enlarged. No pericardial effusion. Motion and quantum mottle artifact limits evaluation. There is suggestion of a tiny filling defect within a distal subsegmental right lower lobe pulmonary artery (image 305; series 9). Additionally there is a right lower lobe subsegmental pulmonary artery which is not opacified (image 240; series 9). The lingular artery is poorly opacified on current exam and not adequately assessed, predominately secondary to motion artifact. Mediastinum/Nodes: No enlarged axillary, mediastinal or hilar lymphadenopathy. Normal esophagus. Lungs/Pleura: Central airways are patent. Dependent atelectasis within the left lower lobe. No pleural effusion or pneumothorax. Upper Abdomen: No acute process. Musculoskeletal: No aggressive or acute appearing osseous lesions. Review of the MIP images confirms the above findings. IMPRESSION: Examination limited secondary to motion and quantum mottle artifact. There is suggestion of a tiny filling defect within a distal right lower lobe subsegmental pulmonary artery. An additional right lower lobe subsegmental pulmonary artery is non-opacified. Findings may be secondary to artifact. Tiny distal embolus is not excluded.  Additionally the lingular artery is not adequately assessed secondary to motion artifact. Patchy left lower lobe atelectasis. Electronically Signed   By: Lovey Newcomer M.D.   On: 06/04/2017 21:29   Nm Pulmonary Perf And Vent  Result Date: 06/05/2017 CLINICAL DATA:  Chest pain, weakness, negative troponin, evaluate for PE EXAM: NUCLEAR MEDICINE VENTILATION - PERFUSION LUNG SCAN TECHNIQUE: Ventilation images were obtained in multiple projections using inhaled aerosol Tc-19mDTPA. Perfusion images were obtained in multiple projections after intravenous injection of Tc-962mAA. RADIOPHARMACEUTICALS:  20.3 mCi Technetium-9959mPA aerosol inhalation and 4.3 mCi Technetium-40m49m IV COMPARISON:  CTA chest dated 08/04/2016 FINDINGS: Ventilation: No focal ventilation defect. Perfusion: No wedge shaped peripheral perfusion defects to suggest acute pulmonary embolism. IMPRESSION: Negative for pulmonary embolism. Electronically Signed   By: SriyJulian Hy.   On: 06/05/2017 14:14   Dg Chest Port 1 View  Result Date: 06/03/2017 CLINICAL DATA:  Worsening chest pain today. EXAM: PORTABLE CHEST 1 VIEW COMPARISON:  Radiographs 6 days prior 05/28/2017 FINDINGS: Stable cardiomegaly and mediastinal contours. Stable pulmonary vasculature. No consolidation, pleural fluid or pneumothorax. Grossly stable osseous structure. IMPRESSION: Stable cardiomegaly without acute abnormality. Electronically Signed   By: Jeb Levering M.D.   On: 06/03/2017 22:40    Review of Systems  Constitutional: Positive for malaise/fatigue.  HENT: Negative.   Eyes: Negative.   Respiratory: Positive for shortness of breath. Negative for cough, hemoptysis and sputum production.   Cardiovascular: Positive for chest pain, orthopnea and leg swelling. Negative for PND.  Gastrointestinal: Negative.   Genitourinary: Negative.   Musculoskeletal: Positive for joint pain (hands and feet).  Skin: Negative.   Neurological: Positive for dizziness  (Occasional) and speech change (Chronic since prior stroke).  Psychiatric/Behavioral: Positive for depression. Negative for suicidal ideas.  All other systems reviewed and are negative.  Blood pressure (!) 147/103, pulse 93, temperature 98 F (36.7 C), temperature source Oral, resp. rate 12, last menstrual period 03/24/2017, SpO2 96 %. Physical Exam  Constitutional: She is oriented to person, place, and time.  Well-built and morbidly obese in no acute distress  HENT:  Difficult to evaluate JVD due to short neck and obesity.  Neck: No thyromegaly present.  Cardiovascular: Normal rate and regular rhythm.  Vascular exam: Femoral pulses feeble and difficult to feel, popliteals feeble and difficult to feel, faint pedal pulses.  No acute arterial insufficiency.  Capillary refill normal.  Respiratory: Effort normal and breath sounds normal.  GI:  Obese, large pannus present.  Limited exam.  Bowel sounds heard in all 4 quadrants.  No tenderness.  Musculoskeletal: She exhibits edema (trace).  Neurological: She is oriented to person, place, and time.   Scheduled Meds: . aspirin EC  81 mg Oral Daily  . azaTHIOprine  100 mg Oral BID  . buPROPion  100 mg Oral Daily  . carvedilol  25 mg Oral BID WC  . cycloSPORINE  1 drop Both Eyes TID  . famotidine  20 mg Oral BID  . hydrALAZINE  50 mg Oral TID  . hydrochlorothiazide  25 mg Oral Daily  . hydroxychloroquine  200 mg Oral BID  . insulin aspart  0-20 Units Subcutaneous TID WC  . insulin aspart  0-5 Units Subcutaneous QHS  . insulin detemir  25 Units Subcutaneous BID  . megestrol  40 mg Oral Daily  . mometasone-formoterol  2 puff Inhalation BID  . potassium chloride SA  20 mEq Oral BID  . pravastatin  10 mg Oral q1800  . predniSONE  4 mg Oral Daily  . pregabalin  75 mg Oral TID  . torsemide  20 mg Oral BID   Continuous Infusions: . heparin 2,100 Units/hr (06/05/17 0756)   PRN Meds:.acetaminophen **OR** acetaminophen, albuterol, ALPRAZolam,  cyclobenzaprine, diclofenac sodium, doxepin, morphine, olopatadine, ondansetron **OR** ondansetron (ZOFRAN) IV, technetium TC 33M diethylenetriame-pentaacetic acid   Cardiac studies:  Heart cath 02/07/12: Normal coronary arteries and LVEF. Left dominant circulation. I could not perform right heart catheterization to evaluate pulmonary hypertension due to difficulty in access. Femoral venous access was not attempted due to risk of bleed.  EKG 06/04/2017: Normal sinus rhythm, normal axis, nonspecific T abnormality.  No significant change from prior EKG.   Lower extremity venous duplex 11/ 10/2016: no evidence of DVT.  Echocardiogram 06/04/2017: Poor echo window due to body habitus.  LVEF estimated at 45-50%.  Global hypokinesis.  No significant valvular abnormality.  Personally reviewed: EF appears to be  preserved.  Contrast not used.  Wall motion with reduced sensitivity.  Assessment/Plan: 1. Atypical chest pain, mostly suggestive of musculoskeletal/pleuritic chest pain 2.  Diabetes mellitus type II uncontrolled 3. Morbid obesity with obesity hypoventilation and chronic dyspnea 4. Uncontrolled diabetes mellitus with hyperglycemia 5. Hypertension with hypertensive heart disease as manifested by EKG abnormalities.  Recommendation: Patient's symptoms of chest pain appeared to be very atypical at most. I discussed with her that although she has been referred to pain clinic she has to be careful not to add to her long list of medical problems of narcotic use.  I tried to reassure that her pain is mostly musculoskeletal. She clearly continues to have significant cardiac risk factors, has gained more weight and states that her sister died 2 weeks ago with cancer and she just let go of diet.  However no change in EKG, chest pain does not appear to be ACS, hence would recommend continued  Risk factor modification and if she continuo have exertional chest discomfort, then we can certainly consider directly  proceeding with cardiac catheterization. Nuclear stress test would be suboptimal given her body habitus. Most importantly weight loss and lifestyle modification discussed. BP is not well controlled add Losartan 50 mg daily.   Adrian Prows, MD, [facility] 06/05/2017, 6:06 PM [facility] Pager: [phone] Office: [phone] If no answer: Cell:  [phone]   06/05/2017, 5:41 PM

## 2017-06-05 NOTE — Progress Notes (Signed)
*  PRELIMINARY RESULTS* Vascular Ultrasound Bilateral lower extremity venous duplex has been completed.  Preliminary findings: No evidence of deep vein thrombosis in the visualized veins of the lower extremities. Negative for baker's cysts bilaterally.  Limited exam due to patient body habitus and penetration.   [name] 06/05/2017, 11:57 AM

## 2017-06-05 NOTE — Progress Notes (Signed)
Inpatient Diabetes Program Recommendations  AACE/ADA: New Consensus Statement on Inpatient Glycemic Control (2015)  Target Ranges:  Prepandial:   less than 140 mg/dL      Peak postprandial:   less than 180 mg/dL (1-2 hours)      Critically ill patients:  140 - 180 mg/dL   Lab Results  Component Value Date   GLUCAP 277 (H) 06/05/2017   HGBA1C 9.4 05/15/2017    Review of Glycemic ControlResults for KOSHA, JAQUITH (MRN [id number]) as of 06/05/2017 10:14  Ref. Range 06/05/2017 08:10  Glucose-Capillary Latest Ref Range: 65 - 99 mg/dL 277 (H)    Diabetes history: Type 2 diabetes Outpatient Diabetes medications: U500 Insulin- 90 units breakfast, 110 units lunch, 110 units supper Current orders for Inpatient glycemic control:  Novolog resistant q 4 hours  Inpatient Diabetes Program Recommendations:    Note that patient was on U500 insulin prior to admit.  May consider adding basal insulin while in the hospital.  Consider adding Levemir 25 units bid while in the hospital.     Thanks, Adah Perl, RN, BC-ADM Inpatient Diabetes Coordinator Pager [phone] (8a-5p)

## 2017-06-05 NOTE — Progress Notes (Signed)
ANTICOAGULATION CONSULT NOTE - Follow Up Consult  Pharmacy Consult for heparin Indication: possible PE  Labs: Recent Labs    06/03/17 2225 06/04/17 0014 06/04/17 1634 06/04/17 2336 06/05/17 0409  HGB 11.9*  --  11.9*  --  11.2*  HCT 36.6  --  37.8  --  34.6*  PLT 292  --  281  --  260  HEPARINUNFRC  --   --   --   --  0.18*  CREATININE 1.03*  --  0.90  --   --   TROPONINI  --  <0.03  --  <0.03  --     Assessment: 46yo female subtherapeutic on heparin with initial dosing for possible PE.  Goal of Therapy:  Heparin level 0.3-0.7 units/ml   Plan:  Will rebolus with heparin 4000 units and increase gtt by 3-4 units/kg/hr to 2100 units/hr and check level in [place], PharmD, BCPS  06/05/2017,4:45 AM

## 2017-06-05 NOTE — Progress Notes (Signed)
ANTICOAGULATION CONSULT NOTE - Follow Up Consult  Pharmacy Consult for heparin Indication: possible PE  Labs: Recent Labs    06/03/17 2225 06/04/17 0014 06/04/17 1634 06/04/17 2336 06/05/17 0409 06/05/17 1110  HGB 11.9*  --  11.9*  --  11.2*  --   HCT 36.6  --  37.8  --  34.6*  --   PLT 292  --  281  --  260  --   HEPARINUNFRC  --   --   --   --  0.18* 0.41  CREATININE 1.03*  --  0.90  --   --   --   TROPONINI  --  <0.03  --  <0.03 <0.03  --     Assessment: 46yo female now at goal on heparin for possible PE.   PE cannot be excluded based on CTA, dopplers were negative this morning. Patient continues on IV heparin at this time. No bleeding complications noted, cbc is stable.   Goal of Therapy:  Heparin level 0.3-0.7 units/ml  Monitor platelets while on heparin therapy.   Plan:  Continue heparin at 2100 units/hr Daily CBC, heparin level Check confirmatory level tonight  Erin Hearing PharmD., BCPS Clinical Pharmacist Pager [phone] 06/05/2017 12:09 PM

## 2017-06-06 ENCOUNTER — Other Ambulatory Visit: Payer: Self-pay

## 2017-06-06 ENCOUNTER — Ambulatory Visit (INDEPENDENT_AMBULATORY_CARE_PROVIDER_SITE_OTHER): Payer: Self-pay | Admitting: Orthopaedic Surgery

## 2017-06-06 DIAGNOSIS — M940 Chondrocostal junction syndrome [Tietze]: Secondary | ICD-10-CM

## 2017-06-06 DIAGNOSIS — I10 Essential (primary) hypertension: Secondary | ICD-10-CM

## 2017-06-06 DIAGNOSIS — Z008 Encounter for other general examination: Secondary | ICD-10-CM

## 2017-06-06 LAB — GLUCOSE, CAPILLARY
Glucose-Capillary: 230 mg/dL — ABNORMAL HIGH (ref 65–99)
Glucose-Capillary: 264 mg/dL — ABNORMAL HIGH (ref 65–99)

## 2017-06-06 LAB — HEPARIN LEVEL (UNFRACTIONATED): Heparin Unfractionated: 0.42 IU/mL (ref 0.30–0.70)

## 2017-06-06 LAB — CBC
HCT: 34.1 % — ABNORMAL LOW (ref 36.0–46.0)
Hemoglobin: 11.1 g/dL — ABNORMAL LOW (ref 12.0–15.0)
MCH: 28.2 pg (ref 26.0–34.0)
MCHC: 32.6 g/dL (ref 30.0–36.0)
MCV: 86.8 fL (ref 78.0–100.0)
PLATELETS: 282 10*3/uL (ref 150–400)
RBC: 3.93 MIL/uL (ref 3.87–5.11)
RDW: 14.1 % (ref 11.5–15.5)
WBC: 11.1 10*3/uL — ABNORMAL HIGH (ref 4.0–10.5)

## 2017-06-06 MED ORDER — CARVEDILOL 25 MG PO TABS
37.5000 mg | ORAL_TABLET | Freq: Two times a day (BID) | ORAL | Status: DC
  Administered 2017-06-06: 37.5 mg via ORAL
  Filled 2017-06-06: qty 1

## 2017-06-06 MED ORDER — LOSARTAN POTASSIUM 100 MG PO TABS: 100.0000 mg | ORAL_TABLET | Freq: Every day | ORAL | 0 refills | Status: DC

## 2017-06-06 MED ORDER — MORPHINE SULFATE 15 MG PO TABS: 15.0000 mg | ORAL_TABLET | Freq: Four times a day (QID) | ORAL | 0 refills | Status: DC | PRN

## 2017-06-06 MED ORDER — CLOPIDOGREL BISULFATE 75 MG PO TABS
75.0000 mg | ORAL_TABLET | Freq: Every day | ORAL | Status: DC
  Administered 2017-06-06: 75 mg via ORAL
  Filled 2017-06-06: qty 1

## 2017-06-06 MED ORDER — DICLOFENAC SODIUM 1 % TD GEL: 2.0000 g | Freq: Four times a day (QID) | TRANSDERMAL | Status: DC | PRN

## 2017-06-06 MED ORDER — LOSARTAN POTASSIUM 50 MG PO TABS
100.0000 mg | ORAL_TABLET | Freq: Every day | ORAL | Status: DC
  Administered 2017-06-06: 100 mg via ORAL
  Filled 2017-06-06: qty 2

## 2017-06-06 MED ORDER — CARVEDILOL 12.5 MG PO TABS: 37.5000 mg | ORAL_TABLET | Freq: Two times a day (BID) | ORAL | 0 refills | Status: DC

## 2017-06-06 NOTE — Discharge Instructions (Signed)
Diabetes Mellitus and Food It is important for you to manage your blood sugar (glucose) level. Your blood glucose level can be greatly affected by what you eat. Eating healthier foods in the appropriate amounts throughout the day at about the same time each day will help you control your blood glucose level. It can also help slow or prevent worsening of your diabetes mellitus. Healthy eating may even help you improve the level of your blood pressure and reach or maintain a healthy weight. General recommendations for healthful eating and cooking habits include:  Eating meals and snacks regularly. Avoid going long periods of time without eating to lose weight.  Eating a diet that consists mainly of plant-based foods, such as fruits, vegetables, nuts, legumes, and whole grains.  Using low-heat cooking methods, such as baking, instead of high-heat cooking methods, such as deep frying.  Work with your dietitian to make sure you understand how to use the Nutrition Facts information on food labels. How can food affect me? Carbohydrates Carbohydrates affect your blood glucose level more than any other type of food. Your dietitian will help you determine how many carbohydrates to eat at each meal and teach you how to count carbohydrates. Counting carbohydrates is important to keep your blood glucose at a healthy level, especially if you are using insulin or taking certain medicines for diabetes mellitus. Alcohol Alcohol can cause sudden decreases in blood glucose (hypoglycemia), especially if you use insulin or take certain medicines for diabetes mellitus. Hypoglycemia can be a life-threatening condition. Symptoms of hypoglycemia (sleepiness, dizziness, and disorientation) are similar to symptoms of having too much alcohol. If your health care provider has given you approval to drink alcohol, do so in moderation and use the following guidelines:  Women should not have more than one drink per day, and men  should not have more than two drinks per day. One drink is equal to: ? 12 oz of beer. ? 5 oz of wine. ? 1 oz of hard liquor.  Do not drink on an empty stomach.  Keep yourself hydrated. Have water, diet soda, or unsweetened iced tea.  Regular soda, juice, and other mixers might contain a lot of carbohydrates and should be counted.  What foods are not recommended? As you make food choices, it is important to remember that all foods are not the same. Some foods have fewer nutrients per serving than other foods, even though they might have the same number of calories or carbohydrates. It is difficult to get your body what it needs when you eat foods with fewer nutrients. Examples of foods that you should avoid that are high in calories and carbohydrates but low in nutrients include:  Trans fats (most processed foods list trans fats on the Nutrition Facts label).  Regular soda.  Juice.  Candy.  Sweets, such as cake, pie, doughnuts, and cookies.  Fried foods.  What foods can I eat? Eat nutrient-rich foods, which will nourish your body and keep you healthy. The food you should eat also will depend on several factors, including:  The calories you need.  The medicines you take.  Your weight.  Your blood glucose level.  Your blood pressure level.  Your cholesterol level.  You should eat a variety of foods, including:  Protein. ? Lean cuts of meat. ? Proteins low in saturated fats, such as fish, egg whites, and beans. Avoid processed meats.  Fruits and vegetables. ? Fruits and vegetables that may help control blood glucose levels, such as apples,   mangoes, and yams.  Dairy products. ? Choose fat-free or low-fat dairy products, such as milk, yogurt, and cheese.  Grains, bread, pasta, and rice. ? Choose whole grain products, such as multigrain bread, whole oats, and brown rice. These foods may help control blood pressure.  Fats. ? Foods containing healthful fats, such as  nuts, avocado, olive oil, canola oil, and fish.  Does everyone with diabetes mellitus have the same meal plan? Because every person with diabetes mellitus is different, there is not one meal plan that works for everyone. It is very important that you meet with a dietitian who will help you create a meal plan that is just right for you. This information is not intended to replace advice given to you by your health care provider. Make sure you discuss any questions you have with your health care provider. Document Released: 04/14/2005 Document Revised: 12/24/2015 Document Reviewed: 06/14/2013 Elsevier Interactive Patient Education  2017 [place].   Preventing Heart Failure Heart failure is a condition in which the heart has trouble pumping blood. This may mean that the heart cannot pump enough blood out to the body, or that the heart does not fill up with enough blood. Either of those problems can lead to symptoms such as fatigue, trouble breathing, and swelling throughout the body. This is a common medical condition that affects not only the heart, but the entire body. Making certain nutrition and lifestyle changes can help you prevent heart failure and avoid serious health problems. What nutrition changes can be made?  If you are overweight or obese, reduce how many calories you eat each day so that you lose weight. Work with your health care provider or a diet and nutrition specialist (dietitian) to determine how many calories you need each day.  Eat foods that are low in salt (sodium). Avoid adding extra salt to foods.  Eat a well-balanced diet that includes a lot of: ? Fresh fruits and vegetables. ? Whole grains. ? Lean meats. ? Beans. ? Fat-free or low-fat dairy products.  Avoid foods that contain a lot of: ? Trans fats. ? Saturated fats. ? Sugar. ? Cholesterol. What lifestyle changes can be made?  Do not use any products that contain nicotine or tobacco, such as cigarettes  and e-cigarettes. If you need help quitting or reducing how much you smoke, ask your health care provider.  Stop using alcohol, or limit alcohol intake to no more than 1 drink a day for nonpregnant women and 2 drinks a day for men. One drink equals 12 oz of beer, 5 oz of wine, or 1 oz of hard liquor.  Exercise for at least 150 minutes each week, or as much as told by your health care provider. ? Do moderate-intensity exercise, such as brisk walking, bicycling, or water aerobics. ? Ask your health care provider which activities are safe for you.  See a health care provider regularly for screening and wellness checks. Know your heart health indicators, such as: ? Blood pressure. ? Cholesterol levels. ? Blood sugar (glucose) levels. ? Weight and BMI.  If you have diabetes, manage your condition and follow your treatment plan as instructed.  Try to get 7-9 hours of sleep each night. To help with sleep: ? Keep your bedroom cool and dark. ? Do not eat a heavy meal during the hour before you go to bed. ? Do not drink alcohol or caffeinated drinks before bed. ? Avoid screen time before bedtime. This means avoiding television, computers, tablets, and  cell phones.  Find ways to relax and manage stress. These may include: ? Breathing exercises. ? Meditation. ? Yoga. ? Listening to music. Why are these changes important?  A well-balanced diet with the appropriate amount of calories can keep your body weight at a healthy level, which reduces strain on your heart.  A low-sodium diet can help keep your blood pressure in a normal range and keep your blood vessels working properly.  Quitting smoking and limiting alcohol intake can reduce harmful effects that these substances have on your heart and blood vessels.  Regular exercise can keep your heart strong so it can pump blood normally.  Managing diabetes helps your blood circulate and can help you maintain a healthy weight.  Managing stress  helps to reduce the risk of high blood pressure and heart problems. What can happen if changes are not made? Heart failure can cause very serious problems that may get worse over time, such as:  Extreme fatigue during normal physical activities.  Shortness of breath or trouble breathing.  Swelling in your abdomen, legs, ankles, feet, or neck.  Fluid buildup throughout the body.  Weight gain.  Cough.  Frequent urination.  What can I do to lower my risk? You may be able to lower your risk of heart failure by:  Losing weight or keeping your weight under control.  Working with your health care provider to manage your: ? Cholesterol. ? Blood pressure. ? Diabetes, if this applies.  Eating a healthy diet.  Exercising regularly.  Avoiding unhealthy habits, such as smoking, drinking, or using drugs.  Getting plenty of sleep.  Managing your stress.  How is this treated? Heart failure cannot be cured except by heart transplant, but treatment can help to improve your quality of life. Treatment may include:  Medicines to help: ? Lower blood pressure. ? Remove excess sodium from your body. ? Relax blood vessels. ? Improve heart function. ? Control other symptoms of heart failure.  Surgery to open blocked coronary arteries or repair damaged heart valves.  Implantation of a biventricular pacemaker to improve heart muscle function (cardiac resynchronization therapy). This device paces both the right ventricle and left ventricle.  Implantation of a device to treat serious abnormal heart rhythms (implantable cardioverter defibrillator, ICD).  Implantation of a mechanical heart pump to improve the pumping ability of your heart (left ventricular assist device, LVAD).  Heart transplant. This treatment is considered for certain people who do not improve with other treatments.  Where to find more information:  National Heart, Lung, and Blood Institute:  ClickDebate.gl  Centers for Disease Control and Prevention: LawyerNetworking.com.cy  [facility]: https://www.[place].info/  American Heart Association: ReligiousCamps.at.jsp Contact a health care provider if:  You have rapid weight gain.  You have increasing shortness of breath that is unusual for you.  You tire easily, or you are unable to participate in your usual activities.  You cough more than normal, especially with physical activity.  You have any swelling or more swelling in areas such as your hands, feet, ankles, or abdomen. Summary  Heart failure can be prevented by making changes to your diet and your lifestyle.  It is important to eat a healthy diet, manage your weight, exercise regularly, manage stress, avoid drugs and alcohol, and keep your cholesterol and blood pressure under control.  Heart failure can cause very serious problems over time. This information is not intended to replace advice given to you by your health care provider. Make sure you discuss any  questions you have with your health care provider. Document Released: 03/08/2016 Document Revised: 03/08/2016 Document Reviewed: 03/08/2016 Elsevier Interactive Patient Education  2018 [place].   Chest Wall Pain Chest wall pain is pain in or around the bones and muscles of your chest. Sometimes, an injury causes this pain. Sometimes, the cause may not be known. This pain may take several weeks or longer to get better. Follow these instructions at home: Pay attention to any changes in your symptoms. Take these actions to help with your pain:  Rest as told by your health care provider.  Avoid activities that cause pain. These include any activities that use your chest muscles or your abdominal and side muscles to lift heavy  items.  If directed, apply ice to the painful area: ? Put ice in a plastic bag. ? Place a towel between your skin and the bag. ? Leave the ice on for 20 minutes, 2-3 times per day.  Take over-the-counter and prescription medicines only as told by your health care provider.  Do not use tobacco products, including cigarettes, chewing tobacco, and e-cigarettes. If you need help quitting, ask your health care provider.  Keep all follow-up visits as told by your health care provider. This is important.  Contact a health care provider if:  You have a fever.  Your chest pain becomes worse.  You have new symptoms. Get help right away if:  You have nausea or vomiting.  You feel sweaty or light-headed.  You have a cough with phlegm (sputum) or you cough up blood.  You develop shortness of breath. This information is not intended to replace advice given to you by your health care provider. Make sure you discuss any questions you have with your health care provider. Document Released: 07/18/2005 Document Revised: 11/26/2015 Document Reviewed: 10/13/2014 Elsevier Interactive Patient Education  2017 [employer].

## 2017-06-06 NOTE — Plan of Care (Signed)
Pt plan progressing

## 2017-06-06 NOTE — Progress Notes (Addendum)
Subjective:  Chest pain better and still points with one finger to left upper chest.  Objective:  Vital Signs in the last 24 hours: Temp:  [98 F (36.7 C)-98.9 F (37.2 C)] 98.2 F (36.8 C) (11/06 0355) Pulse Rate:  [81-93] 81 (11/06 0355) Resp:  [15-20] 15 (11/06 0355) BP: (110-150)/(62-103) 150/93 (11/06 0355) SpO2:  [95 %-100 %] 95 % (11/06 0355) Weight:  [165 kg (363 lb 12.1 oz)-183.6 kg (404 lb 12.2 oz)] 183.6 kg (404 lb 12.2 oz) (11/06 0420) Blood pressure (!) 150/93, pulse 81, temperature 98.1 F (36.7 C), temperature source Oral, resp. rate 15, weight (!) 183.6 kg (404 lb 12.2 oz), last menstrual period 03/24/2017, SpO2 95 %.  Intake/Output from previous day: 11/05 0701 - 11/06 0700 In: 1133 [P.O.:552; I.V.:581] Out: -  Intake/Output from this shift: No intake/output data recorded.  Physical Exam: Well-built and morbidly obese in no acute distress  HENT:  Difficult to evaluate JVD due to short neck and obesity.  Neck: No thyromegaly present.  Cardiovascular: Normal rate and regular rhythm.  Vascular exam: Femoral pulses feeble and difficult to feel, popliteals feeble and difficult to feel, faint pedal pulses.  No acute arterial insufficiency.  Capillary refill normal.  Respiratory: Effort normal and breath sounds normal. Chest wall tenderness left parasternal region present GI:  Obese, large pannus present.  Limited exam.  Bowel sounds heard in all 4 quadrants.  No tenderness.  Musculoskeletal: She exhibits edema (trace).  Neurological: She is oriented to person, place, and time.   Lab Results: Recent Labs    06/05/17 0409 06/06/17 0244  WBC 12.3* 11.1*  HGB 11.2* 11.1*  PLT 260 282   Recent Labs    06/03/17 2225 06/04/17 1634  NA 132* 137  K 3.6 4.1  CL 102 103  CO2 22 24  GLUCOSE 290* 298*  BUN 12 9  CREATININE 1.03* 0.90   Recent Labs    06/05/17 0409 06/05/17 1518  TROPONINI <0.03 <0.03   Hepatic Function Panel Recent Labs     06/03/17 2225  PROT 6.6  ALBUMIN 2.7*  AST 22  ALT 15  ALKPHOS 95  BILITOT 0.2*   Imaging: Imaging results have been reviewed   Cardiac studies:  Heart cath 02/07/12: Normal coronary arteries and LVEF. Left dominant circulation. I could not perform right heart catheterization to evaluate pulmonary hypertension due to difficulty in access. Femoral venous access was not attempted due to risk of bleed.  EKG 06/04/2017: Normal sinus rhythm, normal axis, nonspecific T abnormality.  No significant change from prior EKG.   Lower extremity venous duplex 11/ 10/2016: no evidence of DVT.  Echocardiogram 06/04/2017: Poor echo window due to body habitus.  LVEF estimated at 45-50%.  Global hypokinesis.  No significant valvular abnormality.  Personally reviewed: EF appears to be preserved.  Contrast not used.  Wall motion with reduced sensitivity.  Assessment/Plan: 1. Atypical chest pain, mostly suggestive of musculoskeletal/pleuritic chest pain 2.  Diabetes mellitus type II uncontrolled 3. Morbid obesity with obesity hypoventilation and chronic dyspnea 4. Uncontrolled diabetes mellitus with hyperglycemia 5. Hypertension with hypertensive heart disease as manifested by EKG abnormalities. 6. Chronic diastolic heart failure 7. Obesity hypoventilation  Rec: discontinue heparin IV. Consider PMNR to inject steroid left parasternal region for costochondritis. Stable for discharge. I have extensively counseled her regarding narcotic use. Weight loss discussed. I started Losartan yesterday, increase to 100 mg daily. Change to losartan HCT on discharge. Increase coreg to 37.5 mg BID for HTN control.  Adrian Prows, MD 06/06/2017, 7:59 AM [facility] Pager: [phone] Office: [phone] If no answer: Cell:  [phone]  06/06/2017, 7:55 AM

## 2017-06-06 NOTE — Discharge Summary (Signed)
Physician Discharge Summary  Bryn Saline VPX:[id number] DOB: 08-18-1970 DOA: 06/04/2017  PCP: Bartholome Bill, MD  Admit date: 06/04/2017 Discharge date: 06/06/2017   Recommendations for Outpatient Follow-Up:   1. Weight loss 2. Referral to PM&R for steroid injection for costochondritis   Discharge Diagnosis:   Principal Problem:   Chest pain Active Problems:   SLE   HTN (hypertension)   Chronic diastolic CHF (congestive heart failure) ([place])   Morbid obesity ([place])   Diabetes ([place])   Costochondritis   Discharge disposition:  Home  Discharge Condition: Improved.  Diet recommendation: Low sodium, heart healthy.  Carbohydrate-modified  Wound care: None.   History of Present Illness:   [name] is a 46 y.o. female with medical history significant of SLE, sjogrens, DM2, HTN, diastolic CHF.  Patient presents to the ED with c/o chest pain.  Sharp quality chest pain that's worse with breathing, coughing and movement.  Symptoms are worsening.  Onset yesterday.  Came in to ED yesterday and got sent home after neg trops, felt to be chest wall pain.  Symptoms worsened, back in ED.     Hospital Course by Problem:   CP - questionable  PE: IOE:VOJJ filling defect within a distal right lower lobe subsegmental pulmonary artery.                                           -reproducible Serial trops negative Tele monitor- few PVCs BLE venous duplex- negative VQ scan negative 2d echo: Compared to a prior echo in 2015, the LVEF is lower at 45-50%--appreciate Dr. Einar Gip consult-- she will follow with him   OSA                 CPAP  SLE - continue home prednisone  HTN - adjust BP meds for tighter control  DM2 - -resume home meds  -close outpatient follow up  Morbid obesity Body mass index is 61.54 kg/m.      Medical Consultants:    cards   Discharge Exam:   Vitals:   06/06/17 0924 06/06/17 1150  BP:    Pulse:    Resp:    Temp:  98 F (36.7 C)    SpO2: 100%    Vitals:   06/06/17 0420 06/06/17 0758 06/06/17 0924 06/06/17 1150  BP:      Pulse:      Resp:  19    Temp:  98.1 F (36.7 C)  98 F (36.7 C)  TempSrc:  Oral  Oral  SpO2:  98% 100%   Weight: (!) 183.6 kg (404 lb 12.2 oz)       Gen:  NAD    The results of significant diagnostics from this hospitalization (including imaging, microbiology, ancillary and laboratory) are listed below for reference.     Procedures and Diagnostic Studies:   Dg Chest 2 View  Result Date: 06/05/2017 CLINICAL DATA:  Pulmonary function testing. EXAM: CHEST  2 VIEW COMPARISON:  06/03/2017 FINDINGS: Lungs are adequately inflated without focal airspace consolidation or effusion. Mild prominence of the perihilar markings. Stable moderate cardiomegaly. Remaining bones and soft tissues are unchanged. IMPRESSION: Moderate cardiomegaly with suggestion of minimal vascular congestion. Electronically Signed   By: Marin Olp M.D.   On: 06/05/2017 14:25   Ct Angio Chest Pe W And/or Wo Contrast  Result Date: 06/04/2017 CLINICAL DATA:  Patient with chest pain under  the left breast. Shortness of breath. EXAM: CT ANGIOGRAPHY CHEST WITH CONTRAST TECHNIQUE: Multidetector CT imaging of the chest was performed using the standard protocol during bolus administration of intravenous contrast. Multiplanar CT image reconstructions and MIPs were obtained to evaluate the vascular anatomy. CONTRAST:  100 cc Isovue 370 COMPARISON:  Chest radiograph 06/04/2007. FINDINGS: Cardiovascular: Heart is enlarged. No pericardial effusion. Motion and quantum mottle artifact limits evaluation. There is suggestion of a tiny filling defect within a distal subsegmental right lower lobe pulmonary artery (image 305; series 9). Additionally there is a right lower lobe subsegmental pulmonary artery which is not opacified (image 240; series 9). The lingular artery is poorly opacified on current exam and not adequately assessed, predominately  secondary to motion artifact. Mediastinum/Nodes: No enlarged axillary, mediastinal or hilar lymphadenopathy. Normal esophagus. Lungs/Pleura: Central airways are patent. Dependent atelectasis within the left lower lobe. No pleural effusion or pneumothorax. Upper Abdomen: No acute process. Musculoskeletal: No aggressive or acute appearing osseous lesions. Review of the MIP images confirms the above findings. IMPRESSION: Examination limited secondary to motion and quantum mottle artifact. There is suggestion of a tiny filling defect within a distal right lower lobe subsegmental pulmonary artery. An additional right lower lobe subsegmental pulmonary artery is non-opacified. Findings may be secondary to artifact. Tiny distal embolus is not excluded. Additionally the lingular artery is not adequately assessed secondary to motion artifact. Patchy left lower lobe atelectasis. Electronically Signed   By: Lovey Newcomer M.D.   On: 06/04/2017 21:29   Nm Pulmonary Perf And Vent  Result Date: 06/05/2017 CLINICAL DATA:  Chest pain, weakness, negative troponin, evaluate for PE EXAM: NUCLEAR MEDICINE VENTILATION - PERFUSION LUNG SCAN TECHNIQUE: Ventilation images were obtained in multiple projections using inhaled aerosol Tc-11m DTPA. Perfusion images were obtained in multiple projections after intravenous injection of Tc-71m MAA. RADIOPHARMACEUTICALS:  20.3 mCi Technetium-91m DTPA aerosol inhalation and 4.3 mCi Technetium-50m MAA IV COMPARISON:  CTA chest dated 08/04/2016 FINDINGS: Ventilation: No focal ventilation defect. Perfusion: No wedge shaped peripheral perfusion defects to suggest acute pulmonary embolism. IMPRESSION: Negative for pulmonary embolism. Electronically Signed   By: [name] Hy M.D.   On: 06/05/2017 14:14     Labs:   Basic Metabolic Panel: Recent Labs  Lab 06/03/17 2225 06/04/17 1634 06/05/17 1518  NA 132* 137  --   K 3.6 4.1  --   CL 102 103  --   CO2 22 24  --   GLUCOSE 290* 298*  --     BUN 12 9  --   CREATININE 1.03* 0.90  --   CALCIUM 8.6* 8.8*  --   MG  --   --  1.7   GFR Estimated Creatinine Clearance: 137.9 mL/min (by C-G formula based on SCr of 0.9 mg/dL). Liver Function Tests: Recent Labs  Lab 06/03/17 2225  AST 22  ALT 15  ALKPHOS 95  BILITOT 0.2*  PROT 6.6  ALBUMIN 2.7*   No results for input(s): LIPASE, AMYLASE in the last 168 hours. No results for input(s): AMMONIA in the last 168 hours. Coagulation profile No results for input(s): INR, PROTIME in the last 168 hours.  CBC: Recent Labs  Lab 06/03/17 2225 06/04/17 1634 06/05/17 0409 06/06/17 0244  WBC 10.7* 13.9* 12.3* 11.1*  HGB 11.9* 11.9* 11.2* 11.1*  HCT 36.6 37.8 34.6* 34.1*  MCV 87.8 87.9 86.5 86.8  PLT 292 281 260 282   Cardiac Enzymes: Recent Labs  Lab 06/04/17 0014 06/04/17 2336 06/05/17 0409 06/05/17 1518  TROPONINI <0.03 <  0.03 <0.03 <0.03   BNP: Invalid input(s): POCBNP CBG: Recent Labs  Lab 06/05/17 1159 06/05/17 1730 06/05/17 2114 06/06/17 0756 06/06/17 1215  GLUCAP 246* 337* 257* 230* 264*   D-Dimer Recent Labs    06/04/17 1805  DDIMER 0.98*   Hgb A1c No results for input(s): HGBA1C in the last 72 hours. Lipid Profile No results for input(s): CHOL, HDL, LDLCALC, TRIG, CHOLHDL, LDLDIRECT in the last 72 hours. Thyroid function studies No results for input(s): TSH, T4TOTAL, T3FREE, THYROIDAB in the last 72 hours.  Invalid input(s): FREET3 Anemia work up No results for input(s): VITAMINB12, FOLATE, FERRITIN, TIBC, IRON, RETICCTPCT in the last 72 hours. Microbiology Recent Results (from the past 240 hour(s))  MRSA PCR Screening     Status: None   Collection Time: 06/05/17  6:57 AM  Result Value Ref Range Status   MRSA by PCR NEGATIVE NEGATIVE Final    Comment:        The GeneXpert MRSA Assay (FDA approved for NASAL specimens only), is one component of a comprehensive MRSA colonization surveillance program. It is not intended to diagnose  MRSA infection nor to guide or monitor treatment for MRSA infections. Performed at [facility], [place] [address], [place], [place] [address]      Discharge Instructions:   Discharge Instructions    Ambulatory referral to Physical Medicine Rehab   Complete by:  As directed    inject steroid left parasternal region for costochondritis?   Diet - low sodium heart healthy   Complete by:  As directed    Diet Carb Modified   Complete by:  As directed    Discharge instructions   Complete by:  As directed    Be sure to keep appointment on Thursday for counseling Speak with PCP regarding prior auth of lidoderm patch   Increase activity slowly   Complete by:  As directed      Allergies as of 06/06/2017      Reactions   Metoclopramide Other (See Comments)   Developed restless leg, akathisia type limb movements.    Codeine Itching, Rash, Other (See Comments)   Penicillins Itching   Has patient had a PCN reaction causing immediate rash, facial/tongue/throat swelling, SOB or lightheadedness with hypotension: No Has patient had a PCN reaction causing severe rash involving mucus membranes or skin necrosis: No Has patient had a PCN reaction that required hospitalization: No Has patient had a PCN reaction occurring within the last 10 years:yes If all of the above answers are "NO", then may proceed with Cephalosporin use.   Hydrocodone Rash   Percocet [oxycodone-acetaminophen] Hives, Rash   Tolerates dilaudid      Medication List    TAKE these medications   alendronate 70 MG tablet Commonly known as:  FOSAMAX Take 70 mg by mouth every Saturday. Take with a full glass of water on an empty stomach.   ALPRAZolam 0.5 MG tablet Commonly known as:  XANAX Take 0.5 mg 2 (two) times daily as needed by mouth for anxiety or sleep.   aspirin EC 81 MG tablet Take 81 mg by mouth daily.   azaTHIOprine 50 MG tablet Commonly known as:  IMURAN Take 100 mg by mouth 2 (two) times  daily.   buPROPion 100 MG tablet Commonly known as:  WELLBUTRIN Take 100 mg by mouth every morning.   carvedilol 12.5 MG tablet Commonly known as:  COREG Take 3 tablets (37.5 mg total) 2 (two) times daily with a meal by mouth. What  changed:    medication strength  how much to take   cetirizine 10 MG tablet Commonly known as:  ZYRTEC Take 10 mg by mouth daily.   clopidogrel 75 MG tablet Commonly known as:  PLAVIX Take 75 mg by mouth daily.   clotrimazole-betamethasone cream Commonly known as:  LOTRISONE Apply 1 application daily as needed topically (itching/breakouts on feet).   cyclobenzaprine 10 MG tablet Commonly known as:  FLEXERIL Take 10 mg 2 (two) times daily as needed by mouth.   cycloSPORINE 0.05 % ophthalmic emulsion Commonly known as:  RESTASIS Place 1 drop into both eyes 3 (three) times daily.   diclofenac sodium 1 % Gel Commonly known as:  VOLTAREN Apply 2-4 g 4 (four) times daily as needed topically (joint pain). What changed:  when to take this   doxepin 50 MG capsule Commonly known as:  SINEQUAN Take 50-100 mg by mouth at bedtime as needed (for sleep).   etonogestrel-ethinyl estradiol 0.12-0.015 MG/24HR vaginal ring Commonly known as:  Baker 1 each every 28 (twenty-eight) days vaginally. Insert vaginally and leave in place for 3 consecutive weeks, then remove for 1 week.   exenatide 5 MCG/0.02ML Sopn injection Commonly known as:  BYETTA Inject 0.02 mLs (5 mcg total) into the skin 2 (two) times daily with a meal.   famotidine 20 MG tablet Commonly known as:  PEPCID Take 20 mg by mouth 2 (two) times daily.   hydrALAZINE 50 MG tablet Commonly known as:  APRESOLINE Take 50 mg by mouth 3 (three) times daily.   hydrochlorothiazide 25 MG tablet Commonly known as:  HYDRODIURIL Take 25 mg by mouth daily.   hydroxychloroquine 200 MG tablet Commonly known as:  PLAQUENIL Take 200 mg by mouth 2 (two) times daily.   insulin regular human  CONCENTRATED 500 UNIT/ML injection Commonly known as:  HUMULIN R 3 times a day (just before each meal) 90-110-110 units. What changed:    how much to take  how to take this  when to take this  additional instructions   lidocaine 5 % Commonly known as:  LIDODERM Place 1 patch onto the skin daily. Remove & Discard patch within 12 hours or as directed by MD   losartan 100 MG tablet Commonly known as:  COZAAR Take 1 tablet (100 mg total) daily by mouth. Start taking on:  06/07/2017   LYRICA 75 MG capsule Generic drug:  pregabalin Take 75 mg 3 (three) times daily by mouth.   megestrol 40 MG tablet Commonly known as:  MEGACE Take 40 mg by mouth daily.   metFORMIN 500 MG 24 hr tablet Commonly known as:  GLUCOPHAGE-XR Take 1,000 mg by mouth 2 (two) times daily.   mometasone-formoterol 100-5 MCG/ACT Aero Commonly known as:  DULERA Inhale 2 puffs into the lungs 2 (two) times daily.   morphine 15 MG tablet Commonly known as:  MSIR Take 1 tablet (15 mg total) every 6 (six) hours as needed by mouth for severe pain.   PAZEO 0.7 % Soln Generic drug:  Olopatadine HCl Place 1 drop daily as needed into both eyes (itching/irritation). Pazeo   potassium chloride SA 20 MEQ tablet Commonly known as:  K-DUR,KLOR-CON Take 20 mEq by mouth 2 (two) times daily.   pravastatin 10 MG tablet Commonly known as:  PRAVACHOL Take 10 mg by mouth daily.   predniSONE 1 MG tablet Commonly known as:  DELTASONE Take 4 mg by mouth daily.   albuterol (5 MG/ML) 0.5% nebulizer solution Commonly known as:  PROVENTIL Take 2.5 mg by nebulization every 6 (six) hours as needed for wheezing or shortness of breath.   PROAIR HFA 108 (90 Base) MCG/ACT inhaler Generic drug:  albuterol Inhale 2 puffs into the lungs every 6 (six) hours as needed for wheezing or shortness of breath.   REFRESH OP Place 1 drop 2 (two) times daily into both eyes.   torsemide 20 MG tablet Commonly known as:  DEMADEX Take 1  tablet (20 mg total) by mouth 2 (two) times daily.      Follow-up Information    Bartholome Bill, MD Follow up in 1 week(s).   Specialty:  Family Medicine Contact information: [address] [address] [phone]            Time coordinating discharge: 35 min  Signed:  Kalib Bhagat Alison Stalling   [facility] 06/06/2017, 12:26 PM

## 2017-06-06 NOTE — Plan of Care (Signed)
Pt chest pain 10/10 hoping to dec it with Voltaren cream; had to give one morphine MSIR this morning; will continue to monitor progression   [place]  Avonelle Viveros, RN

## 2017-06-06 NOTE — Progress Notes (Signed)
Pt given d/c instructions, educational papers, prescriptions, reminded to collect belongings, removed IV, now waiting on sister to get off work bc she is pt's ride home.   [place]  Magdalina Whitehead, RN

## 2017-06-13 ENCOUNTER — Other Ambulatory Visit: Payer: Self-pay

## 2017-06-13 ENCOUNTER — Telehealth: Payer: Self-pay | Admitting: Endocrinology

## 2017-06-13 MED ORDER — INSULIN PEN NEEDLE 32G X 4 MM MISC: 5 refills | Status: DC

## 2017-06-13 NOTE — Telephone Encounter (Signed)
Done

## 2017-06-13 NOTE — Telephone Encounter (Signed)
Patient needs prescription for needles that go with the pin sent to [facility]

## 2017-06-14 NOTE — Telephone Encounter (Signed)
[name] from [facility] stated we were not in net work with them  For the insulin pen needles [phone] please advise

## 2017-06-15 ENCOUNTER — Other Ambulatory Visit: Payer: Self-pay

## 2017-06-15 MED ORDER — INSULIN PEN NEEDLE 32G X 4 MM MISC: 5 refills | Status: DC

## 2017-06-15 NOTE — Telephone Encounter (Signed)
Spoke with [name] from [facility] she stated that provider needed to be registered with medicaid. They do not have Dr. Loanne Drilling as being registered. I stated that I would have to find out another option or find out if any other provider was registered with Medicaid in this office.

## 2017-06-15 NOTE — Telephone Encounter (Signed)
I called back to [facility] pharmacy & stated that I had sent the prescription under Dr. Dwyane Dee hoping that since he saw Medicaid patient's that he was registered. I also asked Dr. Ronnie Derby permission to [facility] this & he stated that it was fine with him since it was for patient's pen needles. Physicians pharmacy stated they would call back if they had any further issues.

## 2017-06-19 ENCOUNTER — Ambulatory Visit (INDEPENDENT_AMBULATORY_CARE_PROVIDER_SITE_OTHER): Payer: Medicaid Other | Admitting: Orthopaedic Surgery

## 2017-06-19 ENCOUNTER — Encounter (INDEPENDENT_AMBULATORY_CARE_PROVIDER_SITE_OTHER): Payer: Self-pay | Admitting: Orthopaedic Surgery

## 2017-06-19 DIAGNOSIS — M25561 Pain in right knee: Secondary | ICD-10-CM

## 2017-06-19 DIAGNOSIS — G8929 Other chronic pain: Secondary | ICD-10-CM

## 2017-06-19 MED ORDER — LIDOCAINE HCL 1 % IJ SOLN
3.0000 mL | INTRAMUSCULAR | Status: AC | PRN
  Administered 2017-06-19: 3 mL

## 2017-06-19 MED ORDER — METHYLPREDNISOLONE ACETATE 40 MG/ML IJ SUSP
40.0000 mg | INTRAMUSCULAR | Status: AC | PRN
  Administered 2017-06-19: 40 mg via INTRA_ARTICULAR

## 2017-06-19 NOTE — Progress Notes (Signed)
Office Visit Note   Patient: [name]           Date of Birth: [birth year]           MRN: [id number] Visit Date: 06/19/2017              Requested by: Bartholome Bill, MD [address] [place], [place] [address] PCP: Bartholome Bill, MD   Assessment & Plan: Visit Diagnoses:  1. Chronic pain of right knee     Plan: I encouraged her to try her cane in her opposite hand and I think this will help offload the joint more.  We did talk about the necessity of weight loss to help her joints the most.  Since her blood glucose is under better control I talked about trying a one-time steroid injection in her knee to see if this would help.  She is agreeable to this The risk and benefits of this in detail as well as how she should watch her blood glucose closely over the next week.  She is a candidate for hyaluronic acid but since she has Medicaid did not cover that makes it difficult for [place] to provide that injection.  He tolerated steroid injection well.  She also will try we will see her back in 3 months to see how she is doing overall.  Some over-the-counter topical anti-inflammatories.  Follow-Up Instructions: Return in about 3 months (around 09/19/2017).   Orders:  No orders of the defined types were placed in this encounter.  No orders of the defined types were placed in this encounter.     Procedures: Large Joint Inj: R knee on 06/19/2017 10:44 AM Indications: diagnostic evaluation and pain Details: 22 G 1.5 in needle, superolateral approach  Arthrogram: No  Medications: 3 mL lidocaine 1 %; 40 mg methylPREDNISolone acetate 40 MG/ML Outcome: tolerated well, no immediate complications Procedure, treatment alternatives, risks and benefits explained, specific risks discussed. Consent was given by the patient. Immediately prior to procedure a time out was called to verify the correct patient, procedure, equipment, support staff and site/side marked as required. Patient  was prepped and draped in the usual sterile fashion.       Clinical Data: No additional findings.   Subjective: Chief Complaint  Patient presents with  . Right Knee - Pain   The patient is a very pleasant 46 year old comes in with chronic right knee pain.  This is been worsening over the last year.  She points the medial joint line source of her pain.  She is someone who at 46 years old does weigh over 400 pounds.  She has been dealing with diabetes that has been out of control but she said under better control now.  She is also dealt with recent hospitalization due to chest pain and congestive heart failure.  She does a bit with a cane but she is on the right side which is the painful side of her knee.  She denies any locking catching in the knee and is mainly pain with weightbearing and mainly pain of the medial joint lines where she reports to.  She says it is detrimentally affecting activity living and her quality of life. HPI  Review of Systems She currently reports good blood glucose control.  She said her blood glucose was 98 this morning.  She denies any headache, chest pain, shortness of breath, fever, chills, nausea, vomiting she is alert and oriented x3 and in no acute distress  Objective: Vital  Signs: LMP 03/24/2017   Physical Exam She is alert and oriented x3 in no acute distress Ortho Exam Examination of her right knee shows slight varus malalignment.  She has excellent range of motion of the knee with some slight patellofemoral crepitation but only slight.  Her pain is only on the medial joint line.  There is no lateral joint line tenderness.  The knee feels grossly stable. Specialty Comments:  No specialty comments available.  Imaging: No results found. X-rays independently reviewed of her right knee on the canopy system shows mild medial joint space narrowing and mild particular osteophytes in the medial joint line and patellofemoral joint.  PMFS History: Patient  Active Problem List   Diagnosis Date Noted  . Costochondritis 06/06/2017  . Chest pain 06/04/2017  . Diabetes ([place]) 09/19/2015  . Intractable pain 02/27/2015  . Radiculopathy of cervical spine   . Severe headache 03/05/2014  . Family history of malignant neoplasm of breast   . Family history of malignant neoplasm of ovary   . Intractable nausea and vomiting 11/11/2013  . Septate uterus 09/08/2013  . Diabetic gastroparesis ([place]) 04/28/2013  . Constipation due to pain medication 04/21/2013  . Odynophagia 04/04/2013  . Liver mass 02/28/2013  . Reflux esophagitis 02/26/2013  . Morbid obesity ([place]) 10/10/2012  . Chronic diastolic CHF (congestive heart failure) ([place]) 10/09/2012  . CVA (cerebral infarction) 10/09/2012  . Chronic respiratory failure with hypoxia ([place]) 10/09/2012  . Clinical polymyositis syndrome ([place]) 09/06/2012  . Mononeuritis of lower limb 01/12/2012  . Shortness of breath 11/08/2011  . HTN (hypertension) 11/08/2011  . Systolic CHF, acute on chronic ([place]) 11/08/2011  . HLD (hyperlipidemia) 07/19/2011  . Bell's palsy 08/09/2010  . SLE 08/09/2010  . [place] SYNDROME 08/09/2010   Past Medical History:  Diagnosis Date  . Acute renal failure (HCC)     Intractable nausea vomiting secondary to diabetic gastroparesis causing dehydration and acute renal failure /notes 04/01/2013  . Anginal pain ([place])   . Anxiety   . Bell's palsy 08/09/2010  . CAP (community acquired pneumonia) 10/2011   Archie Endo 11/08/2011  . Chest pain 01/13/2014  . CHF (congestive heart failure) ([place])   . Diabetic gastroparesis associated with type 2 diabetes mellitus ([place])    this is presumed diagnoses, not confirmed by any studies.   . Eczema   . Family history of malignant neoplasm of breast   . Family history of malignant neoplasm of ovary   . GERD (gastroesophageal reflux disease)   . High cholesterol   . Hypertension   . Liver mass 2014   biopsied 03/2013 at [facility], not malignant.  is to  undergo radiologic ablation of the mass in sept/October 2014.   . Lupus   . Migraines    "maybe a couple times/yr" (04/01/2013)  . Obesity   . Obstructive sleep apnea on CPAP 2011  . On home oxygen therapy    "2L 24/7" (02/28/2015)  . SJOGREN'S SYNDROME 08/09/2010  . SLE (systemic lupus erythematosus) ([place])    Archie Endo 12/01/2010  . Stroke [facility]) 2010; 10/2012   "left side is still weak from it, never fully regained full strength; no additions from stroke 10/2012"    Family History  Problem Relation Age of Onset  . Diabetes Mother   . Asthma Mother   . Urticaria Mother   . Breast cancer Maternal Aunt 68       currently 75  . Stomach cancer Maternal Uncle        dx 80s;  deceased  . Prostate cancer Maternal Uncle        deceased 23s  . Cancer Maternal Aunt        unk. primary  . Cancer Maternal Aunt        unk. primary  . Ovarian cancer Maternal Aunt        deceased 72s  . Hypertension Other   . Stroke Other   . Arthritis Other   . Ovarian cancer Sister 9       maternal half-sister; RAD51C positive  . Cancer Paternal Aunt        unk. primary; deceased 71s  . Prostate cancer Paternal Uncle        deceased 35s  . Breast cancer Cousin        deceased 55s; daughter of mat uncle with stomach ca  . Breast cancer Maternal Aunt        deceased 27s  . Asthma Brother   . Asthma Brother   . Allergic rhinitis Neg Hx   . Angioedema Neg Hx     Past Surgical History:  Procedure Laterality Date  . BREAST CYST EXCISION Left 08/2005   epidermoid  . CARDIAC CATHETERIZATION  02/07/12  . ECTOPIC PREGNANCY SURGERY  1999  . ESOPHAGOGASTRODUODENOSCOPY (EGD) N/A 03/02/2015   Performed by Milus Banister, MD at [facility] ENDOSCOPY  . ESOPHAGOGASTRODUODENOSCOPY (EGD) N/A 02/26/2013   Performed by Irene Shipper, MD at [place]  . HERNIA REPAIR    . LEFT AND RIGHT HEART CATHETERIZATION WITH CORONARY ANGIOGRAM N/A 02/07/2012   Performed by Laverda Page, MD at [facility] CATH LAB  . LIVER BIOPSY  03/2013    liver mass/medical hx noted above  . MUSCLE BIOPSY     for lupus/notes 12/01/2010  . UMBILICAL HERNIA REPAIR  1980's   Social History   Occupational History    Employer: UNEMPLOYED    Comment: student  Tobacco Use  . Smoking status: Former Smoker    Years: 3.00    Types: Cigarettes    Last attempt to quit: 06/01/1990    Years since quitting: 27.0  . Smokeless tobacco: Never Used  Substance and Sexual Activity  . Alcohol use: No    Alcohol/week: 0.0 oz  . Drug use: No  . Sexual activity: Yes    Partners: Male    Birth control/protection: None

## 2017-06-26 ENCOUNTER — Other Ambulatory Visit (INDEPENDENT_AMBULATORY_CARE_PROVIDER_SITE_OTHER): Payer: Self-pay

## 2017-06-26 ENCOUNTER — Telehealth (INDEPENDENT_AMBULATORY_CARE_PROVIDER_SITE_OTHER): Payer: Self-pay | Admitting: Orthopaedic Surgery

## 2017-06-26 MED ORDER — TRAMADOL HCL 50 MG PO TABS: 50.0000 mg | ORAL_TABLET | Freq: Four times a day (QID) | ORAL | 0 refills | Status: DC | PRN

## 2017-06-26 NOTE — Telephone Encounter (Signed)
[name], [name] January 17, 1971    Pt called and she stated the shot is not working and she is still in pain . Pt would like to know if she can be prescribed some pain meds or come into the office.

## 2017-06-26 NOTE — Telephone Encounter (Signed)
Given her current medical condition combined with her significant morbid obesity is really nothing else that we can do for her.  There is no reason for her to come in because there is no surgery that we would recommend.  Unfortunately steroid injection did not work.  We can certainly see if she is a candidate for hyaluronic acid and she is due order that for her knee.  Otherwise the only medication that we can prescribe is tramadol 50 mg 1-2 every 6 hours as needed for pain #60 with no refills.

## 2017-06-26 NOTE — Telephone Encounter (Signed)
Please advise 

## 2017-06-26 NOTE — Telephone Encounter (Signed)
Called into pharmacy and we can't get gel injection for her because she has medicaid, I mailed to her home the financial help packet to see if that can help her get this injection

## 2017-06-29 ENCOUNTER — Telehealth (INDEPENDENT_AMBULATORY_CARE_PROVIDER_SITE_OTHER): Payer: Self-pay | Admitting: Orthopaedic Surgery

## 2017-06-29 NOTE — Telephone Encounter (Signed)
Patient aware this has been authorized

## 2017-06-29 NOTE — Telephone Encounter (Signed)
Patient called asking for a prior authorization for her Tramadol to be sent over to the pharmacy. CB # [phone]

## 2017-06-30 NOTE — Telephone Encounter (Signed)
[place] pharmacy advised M25.561, chronic right knee pain

## 2017-06-30 NOTE — Telephone Encounter (Signed)
[facility] Pharmacist called needing ICD 10 diagnosis code to give patient the Tramadol. Pt will take lesser amount so that it doesn't require prior authorization. Please call them back # [phone]

## 2017-07-05 ENCOUNTER — Telehealth: Payer: Self-pay | Admitting: Endocrinology

## 2017-08-08 ENCOUNTER — Other Ambulatory Visit: Payer: Self-pay | Admitting: Endocrinology

## 2017-08-09 ENCOUNTER — Other Ambulatory Visit: Payer: Self-pay

## 2017-08-15 ENCOUNTER — Ambulatory Visit: Payer: Medicaid Other | Admitting: Endocrinology

## 2017-08-17 ENCOUNTER — Other Ambulatory Visit: Payer: Self-pay

## 2017-08-17 ENCOUNTER — Other Ambulatory Visit (INDEPENDENT_AMBULATORY_CARE_PROVIDER_SITE_OTHER): Payer: Self-pay | Admitting: Orthopaedic Surgery

## 2017-08-17 ENCOUNTER — Telehealth: Payer: Self-pay | Admitting: Endocrinology

## 2017-08-17 NOTE — Telephone Encounter (Signed)
Please advise ((send approval or denial to Laurann Montana please))

## 2017-08-17 NOTE — Telephone Encounter (Signed)
Chart says qid, so rx needs to say the same

## 2017-08-17 NOTE — Telephone Encounter (Signed)
Is this ok to send?  

## 2017-08-17 NOTE — Telephone Encounter (Signed)
Patient needs script for Accuchek TestStrips (must be written exactly the same as it was before-where she checks it 7 times or more)-[facility] on [address]

## 2017-08-18 NOTE — Telephone Encounter (Signed)
Called to pharmacy 

## 2017-08-18 NOTE — Telephone Encounter (Signed)
I called patient & she stated that you had written her prescription for 7x daily because of medicare guidelines previously. She keep running out of teat strips earlier if not. Please advise?

## 2017-08-20 NOTE — Telephone Encounter (Signed)
Per medicare, it needs to be the same as on the chart, wich is qid.

## 2017-08-21 ENCOUNTER — Other Ambulatory Visit: Payer: Self-pay

## 2017-08-21 MED ORDER — GLUCOSE BLOOD VI STRP: ORAL_STRIP | 12 refills | Status: DC

## 2017-08-21 NOTE — Telephone Encounter (Signed)
I have called patient & sent prescription for strips three times daily.

## 2017-08-25 ENCOUNTER — Other Ambulatory Visit: Payer: Self-pay

## 2017-08-25 ENCOUNTER — Telehealth: Payer: Self-pay | Admitting: Endocrinology

## 2017-08-25 MED ORDER — GLUCOSE BLOOD VI STRP: ORAL_STRIP | 12 refills | Status: DC

## 2017-08-25 NOTE — Telephone Encounter (Signed)
Patient requests Judson Roch call her at ph# [phone] re: [facility]

## 2017-08-25 NOTE — Telephone Encounter (Signed)
I have resent prescription

## 2017-09-11 ENCOUNTER — Encounter: Payer: Self-pay | Admitting: Endocrinology

## 2017-09-11 ENCOUNTER — Ambulatory Visit (INDEPENDENT_AMBULATORY_CARE_PROVIDER_SITE_OTHER): Payer: Medicaid Other | Admitting: Endocrinology

## 2017-09-11 VITALS — BP 114/70 | HR 95 | Ht 68.0 in | Wt >= 6400 oz

## 2017-09-11 DIAGNOSIS — Z794 Long term (current) use of insulin: Secondary | ICD-10-CM

## 2017-09-11 DIAGNOSIS — E1143 Type 2 diabetes mellitus with diabetic autonomic (poly)neuropathy: Secondary | ICD-10-CM | POA: Diagnosis not present

## 2017-09-11 LAB — POCT GLYCOSYLATED HEMOGLOBIN (HGB A1C): Hemoglobin A1C: 9.8

## 2017-09-11 MED ORDER — INSULIN NPH (HUMAN) (ISOPHANE) 100 UNIT/ML ~~LOC~~ SUSP: 270.0000 [IU] | SUBCUTANEOUS | 11 refills | Status: DC

## 2017-09-11 MED ORDER — EXENATIDE 10 MCG/0.04ML ~~LOC~~ SOPN: 10.0000 ug | PEN_INJECTOR | Freq: Two times a day (BID) | SUBCUTANEOUS | 11 refills | Status: DC

## 2017-09-11 MED ORDER — ACCU-CHEK SOFTCLIX LANCETS MISC: 0 refills | Status: DC

## 2017-09-11 MED ORDER — ACCU-CHEK AVIVA DEVI: 0 refills | Status: AC

## 2017-09-11 MED ORDER — GLUCOSE BLOOD VI STRP: ORAL_STRIP | 0 refills | Status: DC

## 2017-09-11 NOTE — Patient Instructions (Addendum)
check your blood sugar 4 times a day: before the 3 meals, and at bedtime.  also check if you have symptoms of your blood sugar being too high or too low.  please keep a record of the readings and bring it to your next appointment here (or you can bring the meter itself).  You can write it on any piece of paper.  please call us sooner if your blood sugar goes below 70, or if you have a lot of readings over 200. I have sent 2 prescriptions to your pharmacy, to increase the Byetta, and to change the U-500 to NPH, 270 units each morning.   On this type of insulin schedule, you should eat meals on a regular schedule (especially lunch).  If a meal is missed or significantly delayed, your blood sugar could go low. Please call or message [place] next week, to tell us how the blood sugar is doing.   Please come back for a follow-up appointment in 2 months.

## 2017-09-11 NOTE — Telephone Encounter (Signed)
Resent

## 2017-09-11 NOTE — Progress Notes (Signed)
Subjective:    Patient ID: [name], female    DOB: [birth year], 47 y.o.   MRN: [id number]  HPI Pt returns for f/u of diabetes mellitus: DM type: Insulin-requiring type 2 Dx'ed: 1517 Complications: polyneuropathy and gastroparesis.  Therapy: insulin (since 2014), and metformin.   GDM: never.  DKA: never Severe hypoglycemia: never Pancreatitis: never.  Other: she is too ill to undergo weight-loss surgery; she takes multiple daily injections of U-500; pt says she is not at risk for pregnancy; she has U-500 syringes.  The pattern of her cbg's indicates she does not need basal insulin.   Interval history: she is tapering prednisone (now 3 mg qd)  no cbg record, but states cbg's vary from 55-300's.  It is in general higher as the day goes on, except it is sometimes mildly low at lunch.  She says she never misses the insulin. She has not recently checked cbg.   Past Medical History:  Diagnosis Date  . Acute renal failure (HCC)     Intractable nausea vomiting secondary to diabetic gastroparesis causing dehydration and acute renal failure /notes 04/01/2013  . Anginal pain ([place])   . Anxiety   . Bell's palsy 08/09/2010  . CAP (community acquired pneumonia) 10/2011   Archie Endo 11/08/2011  . Chest pain 01/13/2014  . CHF (congestive heart failure) ([place])   . Diabetic gastroparesis associated with type 2 diabetes mellitus ([place])    this is presumed diagnoses, not confirmed by any studies.   . Eczema   . Family history of malignant neoplasm of breast   . Family history of malignant neoplasm of ovary   . GERD (gastroesophageal reflux disease)   . High cholesterol   . Hypertension   . Liver mass 2014   biopsied 03/2013 at [facility], not malignant.  is to undergo radiologic ablation of the mass in sept/October 2014.   . Lupus   . Migraines    "maybe a couple times/yr" (04/01/2013)  . Obesity   . Obstructive sleep apnea on CPAP 2011  . On home oxygen therapy    "2L 24/7" (02/28/2015)  . SJOGREN'S  SYNDROME 08/09/2010  . SLE (systemic lupus erythematosus) ([place])    Archie Endo 12/01/2010  . Stroke [facility] [place]) 2010; 10/2012   "left side is still weak from it, never fully regained full strength; no additions from stroke 10/2012"    Past Surgical History:  Procedure Laterality Date  . BREAST CYST EXCISION Left 08/2005   epidermoid  . CARDIAC CATHETERIZATION  02/07/12  . ECTOPIC PREGNANCY SURGERY  1999  . ESOPHAGOGASTRODUODENOSCOPY N/A 02/26/2013   Procedure: ESOPHAGOGASTRODUODENOSCOPY (EGD);  Surgeon: Irene Shipper, MD;  Location: [facility] ENDOSCOPY;  Service: Endoscopy;  Laterality: N/A;  . ESOPHAGOGASTRODUODENOSCOPY N/A 03/02/2015   Procedure: ESOPHAGOGASTRODUODENOSCOPY (EGD);  Surgeon: Milus Banister, MD;  Location: [facility];  Service: Endoscopy;  Laterality: N/A;  . HERNIA REPAIR    . LEFT AND RIGHT HEART CATHETERIZATION WITH CORONARY ANGIOGRAM N/A 02/07/2012   Procedure: LEFT AND RIGHT HEART CATHETERIZATION WITH CORONARY ANGIOGRAM;  Surgeon: Laverda Page, MD;  Location: [facility] CATH LAB;  Service: Cardiovascular;  Laterality: N/A;  . LIVER BIOPSY  03/2013   liver mass/medical hx noted above  . MUSCLE BIOPSY     for lupus/notes 12/01/2010  . UMBILICAL HERNIA REPAIR  1980's    Social History   Socioeconomic History  . Marital status: Single    Spouse name: Not on file  . Number of children: Not on file  . Years of education:  Not on file  . Highest education level: Not on file  Social Needs  . Financial resource strain: Not on file  . Food insecurity - worry: Not on file  . Food insecurity - inability: Not on file  . Transportation needs - medical: Not on file  . Transportation needs - non-medical: Not on file  Occupational History    Employer: UNEMPLOYED    Comment: student  Tobacco Use  . Smoking status: Former Smoker    Years: 3.00    Types: Cigarettes    Last attempt to quit: 06/01/1990    Years since quitting: 27.2  . Smokeless tobacco: Never Used  Substance and Sexual Activity  .  Alcohol use: No    Alcohol/week: 0.0 oz  . Drug use: No  . Sexual activity: Yes    Partners: Male    Birth control/protection: None  Other Topics Concern  . Not on file  Social History Narrative   Regular exercise-yes    Current Outpatient Medications on File Prior to Visit  Medication Sig Dispense Refill  . albuterol (PROAIR HFA) 108 (90 BASE) MCG/ACT inhaler Inhale 2 puffs into the lungs every 6 (six) hours as needed for wheezing or shortness of breath.     Marland Kitchen albuterol (PROVENTIL) (5 MG/ML) 0.5% nebulizer solution Take 2.5 mg by nebulization every 6 (six) hours as needed for wheezing or shortness of breath.   5  . alendronate (FOSAMAX) 70 MG tablet Take 70 mg by mouth every Saturday. Take with a full glass of water on an empty stomach.    . ALPRAZolam (XANAX) 0.5 MG tablet Take 0.5 mg 2 (two) times daily as needed by mouth for anxiety or sleep.     Marland Kitchen aspirin EC 81 MG tablet Take 81 mg by mouth daily.    Marland Kitchen azaTHIOprine (IMURAN) 50 MG tablet Take 100 mg by mouth 2 (two) times daily.     Marland Kitchen buPROPion (WELLBUTRIN) 100 MG tablet Take 100 mg by mouth every morning.    . carvedilol (COREG) 12.5 MG tablet Take 3 tablets (37.5 mg total) 2 (two) times daily with a meal by mouth. 180 tablet 0  . cetirizine (ZYRTEC) 10 MG tablet Take 10 mg by mouth daily.    . clopidogrel (PLAVIX) 75 MG tablet Take 75 mg by mouth daily.    . clotrimazole-betamethasone (LOTRISONE) cream Apply 1 application daily as needed topically (itching/breakouts on feet).     . cyclobenzaprine (FLEXERIL) 10 MG tablet Take 10 mg 2 (two) times daily as needed by mouth.     . cycloSPORINE (RESTASIS) 0.05 % ophthalmic emulsion Place 1 drop into both eyes 3 (three) times daily.    . diclofenac sodium (VOLTAREN) 1 % GEL Apply 2-4 g 4 (four) times daily as needed topically (joint pain).    Marland Kitchen doxepin (SINEQUAN) 50 MG capsule Take 50-100 mg by mouth at bedtime as needed (for sleep).    Marland Kitchen etonogestrel-ethinyl estradiol (NUVARING)  0.12-0.015 MG/24HR vaginal ring Place 1 each every 28 (twenty-eight) days vaginally. Insert vaginally and leave in place for 3 consecutive weeks, then remove for 1 week.    . famotidine (PEPCID) 20 MG tablet Take 20 mg by mouth 2 (two) times daily.    . hydrALAZINE (APRESOLINE) 50 MG tablet Take 50 mg by mouth 3 (three) times daily.    . hydrochlorothiazide (HYDRODIURIL) 25 MG tablet Take 25 mg by mouth daily.    . hydroxychloroquine (PLAQUENIL) 200 MG tablet Take 200 mg by mouth 2 (two)  times daily.     . Insulin Pen Needle 32G X 4 MM MISC Used to inject insulin 1x daily. 100 each 5  . lidocaine (LIDODERM) 5 % Place 1 patch onto the skin daily. Remove & Discard patch within 12 hours or as directed by MD 30 patch 0  . losartan (COZAAR) 100 MG tablet Take 1 tablet (100 mg total) daily by mouth. 30 tablet 0  . megestrol (MEGACE) 40 MG tablet Take 40 mg by mouth daily.  3  . metFORMIN (GLUCOPHAGE-XR) 500 MG 24 hr tablet Take 1,000 mg by mouth 2 (two) times daily.    . mometasone-formoterol (DULERA) 100-5 MCG/ACT AERO Inhale 2 puffs into the lungs 2 (two) times daily.    Marland Kitchen morphine (MSIR) 15 MG tablet Take 1 tablet (15 mg total) every 6 (six) hours as needed by mouth for severe pain. 6 tablet 0  . Olopatadine HCl (PAZEO) 0.7 % SOLN Place 1 drop daily as needed into both eyes (itching/irritation). Pazeo    . Polyvinyl Alcohol-Povidone (REFRESH OP) Place 1 drop 2 (two) times daily into both eyes.     . potassium chloride SA (K-DUR,KLOR-CON) 20 MEQ tablet Take 20 mEq by mouth 2 (two) times daily.    . pravastatin (PRAVACHOL) 10 MG tablet Take 10 mg by mouth daily.    . predniSONE (DELTASONE) 1 MG tablet Take 4 mg by mouth daily.    . pregabalin (LYRICA) 75 MG capsule Take 75 mg 3 (three) times daily by mouth.     . torsemide (DEMADEX) 20 MG tablet Take 1 tablet (20 mg total) by mouth 2 (two) times daily. 30 tablet 0  . traMADol (ULTRAM) 50 MG tablet TAKE 1 TO 2 TABLETS BY MOUTH EVERY 6 HOURS AS NEEDED  FOR PAIN**PA REQ** 60 tablet 0  . ACCU-CHEK SOFTCLIX LANCETS lancets USE TO TEST BLOOD SUGAR 3 TIMES A DAY 300 each 0  . Blood Glucose Monitoring Suppl (ACCU-CHEK AVIVA) device Use as instructed 1 each 0  . glucose blood (ACCU-CHEK AVIVA PLUS) test strip Used to check blood sugar 3x daily. 300 each 0  . [DISCONTINUED] promethazine (PHENERGAN) 25 MG tablet Take 1 tablet (25 mg total) by mouth every 8 (eight) hours as needed for nausea or vomiting. 15 tablet 0   No current facility-administered medications on file prior to visit.     Allergies  Allergen Reactions  . Metoclopramide Other (See Comments)    Developed restless leg, akathisia type limb movements.   . Codeine Itching, Rash and Other (See Comments)  . Penicillins Itching    Has patient had a PCN reaction causing immediate rash, facial/tongue/throat swelling, SOB or lightheadedness with hypotension: No Has patient had a PCN reaction causing severe rash involving mucus membranes or skin necrosis: No Has patient had a PCN reaction that required hospitalization: No Has patient had a PCN reaction occurring within the last 10 years:yes If all of the above answers are "NO", then may proceed with Cephalosporin use.  Marland Kitchen Hydrocodone Rash  . Percocet [Oxycodone-Acetaminophen] Hives and Rash    Tolerates dilaudid    Family History  Problem Relation Age of Onset  . Diabetes Mother   . Asthma Mother   . Urticaria Mother   . Breast cancer Maternal Aunt 26       currently 34  . Stomach cancer Maternal Uncle        dx 29s; deceased  . Prostate cancer Maternal Uncle        deceased 26s  .  Cancer Maternal Aunt        unk. primary  . Cancer Maternal Aunt        unk. primary  . Ovarian cancer Maternal Aunt        deceased 25s  . Hypertension Other   . Stroke Other   . Arthritis Other   . Ovarian cancer Sister 69       maternal half-sister; RAD51C positive  . Cancer Paternal Aunt        unk. primary; deceased 97s  . Prostate cancer  Paternal Uncle        deceased 65s  . Breast cancer Cousin        deceased 40s; daughter of mat uncle with stomach ca  . Breast cancer Maternal Aunt        deceased 90s  . Asthma Brother   . Asthma Brother   . Allergic rhinitis Neg Hx   . Angioedema Neg Hx     BP 114/70   Pulse 95   Ht _0  (1.727 m)   Wt (!) 413 lb (187.3 kg)   LMP 03/24/2017   SpO2 97%   BMI 62.80 kg/m   Review of Systems Denies nausea.     Objective:   Physical Exam VITAL SIGNS:  See vs page GENERAL: no distress Pulses: dorsalis pedis intact bilat.   MSK: no deformity of the feet CV: trace bilat leg edema Skin:  no ulcer on the feet.  normal color and temp on the feet. Neuro: sensation is intact to touch on the feet   A1c=9.8%  Lab Results  Component Value Date   CREATININE 0.90 06/04/2017   BUN 9 06/04/2017   NA 137 06/04/2017   K 4.1 06/04/2017   CL 103 06/04/2017   CO2 24 06/04/2017       Assessment & Plan:  Insulin-requiring type 2 DM: worse.   Hypoglycemia: this limits aggressiveness of glycemic control.  Noncompliance with cbg recording: we should abandon multiple daily injections.   Patient Instructions  check your blood sugar 4 times a day: before the 3 meals, and at bedtime.  also check if you have symptoms of your blood sugar being too high or too low.  please keep a record of the readings and bring it to your next appointment here (or you can bring the meter itself).  You can write it on any piece of paper.  please call us sooner if your blood sugar goes below 70, or if you have a lot of readings over 200. I have sent 2 prescriptions to your pharmacy, to increase the Byetta, and to change the U-500 to NPH, 270 units each morning.   On this type of insulin schedule, you should eat meals on a regular schedule (especially lunch).  If a meal is missed or significantly delayed, your blood sugar could go low. Please call or message [place] next week, to tell us how the blood sugar is  doing.   Please come back for a follow-up appointment in 2 months.

## 2017-09-11 NOTE — Telephone Encounter (Addendum)
Patient stated that pharmacy did not receive the Accu check meter or the test strips.  [place], [place] [phone] (Phone) [phone] (Fax)

## 2017-09-12 ENCOUNTER — Other Ambulatory Visit: Payer: Self-pay

## 2017-09-12 NOTE — Telephone Encounter (Signed)
manjula calling from [facility] is calling to see will the Novolin will replace the humulin, and if so will need directions  Please advise [phone]

## 2017-09-12 NOTE — Telephone Encounter (Signed)
I called [facility] & spoke with Manjula. I have changed the Novolin N to Humulin N per insurance.  I also stated that Humulin R was no longer in current med list, so she suspended that prescription.

## 2017-09-14 ENCOUNTER — Other Ambulatory Visit: Payer: Self-pay

## 2017-09-14 MED ORDER — GLUCOSE BLOOD VI STRP: ORAL_STRIP | 2 refills | Status: DC

## 2017-09-14 MED ORDER — "INSULIN SYRINGE-NEEDLE U-100 31G X 5/16"" 1 ML MISC": 4 refills | Status: DC

## 2017-09-19 ENCOUNTER — Ambulatory Visit (INDEPENDENT_AMBULATORY_CARE_PROVIDER_SITE_OTHER): Payer: Medicaid Other | Admitting: Orthopaedic Surgery

## 2017-09-20 ENCOUNTER — Inpatient Hospital Stay (HOSPITAL_COMMUNITY)
Admission: EM | Admit: 2017-09-20 | Discharge: 2017-09-23 | DRG: 291 | Disposition: A | Discharge status: Home / Self Care | Payer: Medicaid Other | Attending: Internal Medicine | Admitting: Internal Medicine

## 2017-09-20 ENCOUNTER — Inpatient Hospital Stay (HOSPITAL_COMMUNITY): Payer: Medicaid Other

## 2017-09-20 ENCOUNTER — Emergency Department (HOSPITAL_COMMUNITY): Payer: Medicaid Other

## 2017-09-20 DIAGNOSIS — Z885 Allergy status to narcotic agent status: Secondary | ICD-10-CM

## 2017-09-20 DIAGNOSIS — E1143 Type 2 diabetes mellitus with diabetic autonomic (poly)neuropathy: Secondary | ICD-10-CM | POA: Diagnosis present

## 2017-09-20 DIAGNOSIS — K3184 Gastroparesis: Secondary | ICD-10-CM | POA: Diagnosis present

## 2017-09-20 DIAGNOSIS — R0902 Hypoxemia: Secondary | ICD-10-CM | POA: Diagnosis present

## 2017-09-20 DIAGNOSIS — Z7902 Long term (current) use of antithrombotics/antiplatelets: Secondary | ICD-10-CM

## 2017-09-20 DIAGNOSIS — F419 Anxiety disorder, unspecified: Secondary | ICD-10-CM | POA: Diagnosis present

## 2017-09-20 DIAGNOSIS — Z9981 Dependence on supplemental oxygen: Secondary | ICD-10-CM

## 2017-09-20 DIAGNOSIS — E1159 Type 2 diabetes mellitus with other circulatory complications: Secondary | ICD-10-CM | POA: Diagnosis present

## 2017-09-20 DIAGNOSIS — R52 Pain, unspecified: Secondary | ICD-10-CM | POA: Diagnosis present

## 2017-09-20 DIAGNOSIS — I110 Hypertensive heart disease with heart failure: Principal | ICD-10-CM | POA: Diagnosis present

## 2017-09-20 DIAGNOSIS — M329 Systemic lupus erythematosus, unspecified: Secondary | ICD-10-CM | POA: Diagnosis present

## 2017-09-20 DIAGNOSIS — G4733 Obstructive sleep apnea (adult) (pediatric): Secondary | ICD-10-CM | POA: Diagnosis present

## 2017-09-20 DIAGNOSIS — J189 Pneumonia, unspecified organism: Secondary | ICD-10-CM | POA: Diagnosis present

## 2017-09-20 DIAGNOSIS — F329 Major depressive disorder, single episode, unspecified: Secondary | ICD-10-CM | POA: Diagnosis present

## 2017-09-20 DIAGNOSIS — R0602 Shortness of breath: Secondary | ICD-10-CM | POA: Diagnosis present

## 2017-09-20 DIAGNOSIS — I639 Cerebral infarction, unspecified: Secondary | ICD-10-CM | POA: Diagnosis present

## 2017-09-20 DIAGNOSIS — Z794 Long term (current) use of insulin: Secondary | ICD-10-CM

## 2017-09-20 DIAGNOSIS — E785 Hyperlipidemia, unspecified: Secondary | ICD-10-CM | POA: Diagnosis not present

## 2017-09-20 DIAGNOSIS — J9601 Acute respiratory failure with hypoxia: Secondary | ICD-10-CM | POA: Diagnosis present

## 2017-09-20 DIAGNOSIS — I5022 Chronic systolic (congestive) heart failure: Secondary | ICD-10-CM | POA: Diagnosis present

## 2017-09-20 DIAGNOSIS — J181 Lobar pneumonia, unspecified organism: Secondary | ICD-10-CM | POA: Diagnosis not present

## 2017-09-20 DIAGNOSIS — Z888 Allergy status to other drugs, medicaments and biological substances status: Secondary | ICD-10-CM

## 2017-09-20 DIAGNOSIS — Z833 Family history of diabetes mellitus: Secondary | ICD-10-CM | POA: Diagnosis not present

## 2017-09-20 DIAGNOSIS — Z79899 Other long term (current) drug therapy: Secondary | ICD-10-CM

## 2017-09-20 DIAGNOSIS — J9600 Acute respiratory failure, unspecified whether with hypoxia or hypercapnia: Secondary | ICD-10-CM

## 2017-09-20 DIAGNOSIS — I10 Essential (primary) hypertension: Secondary | ICD-10-CM | POA: Diagnosis present

## 2017-09-20 DIAGNOSIS — I69354 Hemiplegia and hemiparesis following cerebral infarction affecting left non-dominant side: Secondary | ICD-10-CM | POA: Diagnosis not present

## 2017-09-20 DIAGNOSIS — Z7952 Long term (current) use of systemic steroids: Secondary | ICD-10-CM

## 2017-09-20 DIAGNOSIS — E119 Type 2 diabetes mellitus without complications: Secondary | ICD-10-CM

## 2017-09-20 DIAGNOSIS — I509 Heart failure, unspecified: Secondary | ICD-10-CM

## 2017-09-20 DIAGNOSIS — Z87891 Personal history of nicotine dependence: Secondary | ICD-10-CM

## 2017-09-20 DIAGNOSIS — G8929 Other chronic pain: Secondary | ICD-10-CM | POA: Diagnosis present

## 2017-09-20 DIAGNOSIS — Z6841 Body Mass Index (BMI) 40.0 and over, adult: Secondary | ICD-10-CM | POA: Diagnosis not present

## 2017-09-20 DIAGNOSIS — Z7983 Long term (current) use of bisphosphonates: Secondary | ICD-10-CM | POA: Diagnosis not present

## 2017-09-20 DIAGNOSIS — I5042 Chronic combined systolic (congestive) and diastolic (congestive) heart failure: Secondary | ICD-10-CM | POA: Diagnosis present

## 2017-09-20 DIAGNOSIS — I50813 Acute on chronic right heart failure: Secondary | ICD-10-CM

## 2017-09-20 DIAGNOSIS — I5023 Acute on chronic systolic (congestive) heart failure: Secondary | ICD-10-CM | POA: Diagnosis present

## 2017-09-20 DIAGNOSIS — Z7982 Long term (current) use of aspirin: Secondary | ICD-10-CM | POA: Diagnosis not present

## 2017-09-20 DIAGNOSIS — K219 Gastro-esophageal reflux disease without esophagitis: Secondary | ICD-10-CM | POA: Diagnosis present

## 2017-09-20 DIAGNOSIS — M3500 Sicca syndrome, unspecified: Secondary | ICD-10-CM | POA: Diagnosis present

## 2017-09-20 DIAGNOSIS — I5043 Acute on chronic combined systolic (congestive) and diastolic (congestive) heart failure: Secondary | ICD-10-CM | POA: Diagnosis present

## 2017-09-20 DIAGNOSIS — E1169 Type 2 diabetes mellitus with other specified complication: Secondary | ICD-10-CM

## 2017-09-20 LAB — CBC
HEMATOCRIT: 36.4 % (ref 36.0–46.0)
Hemoglobin: 11.6 g/dL — ABNORMAL LOW (ref 12.0–15.0)
MCH: 28.4 pg (ref 26.0–34.0)
MCHC: 31.9 g/dL (ref 30.0–36.0)
MCV: 89.2 fL (ref 78.0–100.0)
Platelets: 339 10*3/uL (ref 150–400)
RBC: 4.08 MIL/uL (ref 3.87–5.11)
RDW: 14.9 % (ref 11.5–15.5)
WBC: 18.5 10*3/uL — ABNORMAL HIGH (ref 4.0–10.5)

## 2017-09-20 LAB — I-STAT CHEM 8, ED
BUN: 13 mg/dL (ref 6–20)
CREATININE: 1 mg/dL (ref 0.44–1.00)
Calcium, Ion: 1.16 mmol/L (ref 1.15–1.40)
Chloride: 104 mmol/L (ref 101–111)
Glucose, Bld: 201 mg/dL — ABNORMAL HIGH (ref 65–99)
HEMATOCRIT: 40 % (ref 36.0–46.0)
Hemoglobin: 13.6 g/dL (ref 12.0–15.0)
POTASSIUM: 3.6 mmol/L (ref 3.5–5.1)
Sodium: 141 mmol/L (ref 135–145)
TCO2: 24 mmol/L (ref 22–32)

## 2017-09-20 LAB — COMPREHENSIVE METABOLIC PANEL
ALT: 8 U/L — AB (ref 14–54)
ANION GAP: 16 — AB (ref 5–15)
AST: 27 U/L (ref 15–41)
Albumin: 2.6 g/dL — ABNORMAL LOW (ref 3.5–5.0)
Alkaline Phosphatase: 90 U/L (ref 38–126)
BUN: 12 mg/dL (ref 6–20)
CHLORIDE: 105 mmol/L (ref 101–111)
CO2: 20 mmol/L — AB (ref 22–32)
CREATININE: 1.23 mg/dL — AB (ref 0.44–1.00)
Calcium: 8.6 mg/dL — ABNORMAL LOW (ref 8.9–10.3)
GFR calc non Af Amer: 51 mL/min — ABNORMAL LOW (ref >60)
GFR, EST AFRICAN AMERICAN: 60 mL/min — AB (ref >60)
Glucose, Bld: 217 mg/dL — ABNORMAL HIGH (ref 65–99)
Potassium: 3.7 mmol/L (ref 3.5–5.1)
SODIUM: 141 mmol/L (ref 135–145)
Total Bilirubin: 0.4 mg/dL (ref 0.3–1.2)
Total Protein: 6.6 g/dL (ref 6.5–8.1)

## 2017-09-20 LAB — CBC WITH DIFFERENTIAL/PLATELET
BASOS ABS: 0 10*3/uL (ref 0.0–0.1)
Basophils Relative: 0 %
EOS PCT: 2 %
Eosinophils Absolute: 0.3 10*3/uL (ref 0.0–0.7)
HCT: 37.5 % (ref 36.0–46.0)
Hemoglobin: 11.5 g/dL — ABNORMAL LOW (ref 12.0–15.0)
LYMPHS ABS: 5 10*3/uL — AB (ref 0.7–4.0)
Lymphocytes Relative: 34 %
MCH: 27.8 pg (ref 26.0–34.0)
MCHC: 30.7 g/dL (ref 30.0–36.0)
MCV: 90.6 fL (ref 78.0–100.0)
Monocytes Absolute: 0.8 10*3/uL (ref 0.1–1.0)
Monocytes Relative: 5 %
Neutro Abs: 8.6 10*3/uL — ABNORMAL HIGH (ref 1.7–7.7)
Neutrophils Relative %: 59 %
PLATELETS: 362 10*3/uL (ref 150–400)
RBC: 4.14 MIL/uL (ref 3.87–5.11)
RDW: 15.1 % (ref 11.5–15.5)
WBC: 14.7 10*3/uL — AB (ref 4.0–10.5)

## 2017-09-20 LAB — I-STAT ARTERIAL BLOOD GAS, ED
Acid-base deficit: 3 mmol/L — ABNORMAL HIGH (ref 0.0–2.0)
BICARBONATE: 23.6 mmol/L (ref 20.0–28.0)
O2 SAT: 99 %
PCO2 ART: 45.4 mmHg (ref 32.0–48.0)
PO2 ART: 146 mmHg — AB (ref 83.0–108.0)
TCO2: 25 mmol/L (ref 22–32)
pH, Arterial: 7.324 — ABNORMAL LOW (ref 7.350–7.450)

## 2017-09-20 LAB — CBG MONITORING, ED: Glucose-Capillary: 110 mg/dL — ABNORMAL HIGH (ref 65–99)

## 2017-09-20 LAB — CREATININE, SERUM
Creatinine, Ser: 1.05 mg/dL — ABNORMAL HIGH (ref 0.44–1.00)
GFR calc Af Amer: >60 mL/min (ref >60)

## 2017-09-20 LAB — I-STAT TROPONIN, ED: Troponin i, poc: 0.01 ng/mL (ref 0.00–0.08)

## 2017-09-20 LAB — BRAIN NATRIURETIC PEPTIDE: B Natriuretic Peptide: 263.3 pg/mL — ABNORMAL HIGH (ref 0.0–100.0)

## 2017-09-20 LAB — D-DIMER, QUANTITATIVE: D-Dimer, Quant: 5.29 ug/mL-FEU — ABNORMAL HIGH (ref 0.00–0.50)

## 2017-09-20 MED ORDER — ENOXAPARIN SODIUM 100 MG/ML ~~LOC~~ SOLN
100.0000 mg | Freq: Every day | SUBCUTANEOUS | Status: DC
  Administered 2017-09-22 – 2017-09-23 (×2): 100 mg via SUBCUTANEOUS
  Filled 2017-09-20 (×3): qty 1

## 2017-09-20 MED ORDER — PRAVASTATIN SODIUM 10 MG PO TABS
10.0000 mg | ORAL_TABLET | Freq: Every day | ORAL | Status: DC
  Administered 2017-09-21 – 2017-09-23 (×3): 10 mg via ORAL
  Filled 2017-09-20 (×4): qty 1

## 2017-09-20 MED ORDER — HYDRALAZINE HCL 50 MG PO TABS: 50.0000 mg | ORAL_TABLET | Freq: Three times a day (TID) | ORAL | Status: DC

## 2017-09-20 MED ORDER — FUROSEMIDE 10 MG/ML IJ SOLN
INTRAMUSCULAR | Status: AC
  Filled 2017-09-20: qty 8

## 2017-09-20 MED ORDER — IOPAMIDOL (ISOVUE-370) INJECTION 76%
INTRAVENOUS | Status: AC
  Administered 2017-09-20: 100 mL
  Filled 2017-09-20: qty 100

## 2017-09-20 MED ORDER — SODIUM CHLORIDE 0.9% FLUSH: 3.0000 mL | INTRAVENOUS | Status: DC | PRN

## 2017-09-20 MED ORDER — FUROSEMIDE 10 MG/ML IJ SOLN
100.0000 mg | Freq: Once | INTRAVENOUS | Status: DC
  Filled 2017-09-20: qty 10

## 2017-09-20 MED ORDER — ALBUTEROL (5 MG/ML) CONTINUOUS INHALATION SOLN
10.0000 mg/h | INHALATION_SOLUTION | Freq: Once | RESPIRATORY_TRACT | Status: AC
  Administered 2017-09-20: 10 mg/h via RESPIRATORY_TRACT
  Filled 2017-09-20: qty 20

## 2017-09-20 MED ORDER — ALPRAZOLAM 0.5 MG PO TABS: 0.5000 mg | ORAL_TABLET | Freq: Two times a day (BID) | ORAL | Status: DC | PRN

## 2017-09-20 MED ORDER — CLOPIDOGREL BISULFATE 75 MG PO TABS: 75.0000 mg | ORAL_TABLET | Freq: Every day | ORAL | Status: DC

## 2017-09-20 MED ORDER — OLOPATADINE HCL 0.7 % OP SOLN: 1.0000 [drp] | Freq: Every day | OPHTHALMIC | Status: DC | PRN

## 2017-09-20 MED ORDER — PREDNISONE 1 MG PO TABS
4.0000 mg | ORAL_TABLET | Freq: Every day | ORAL | Status: DC
  Administered 2017-09-21 – 2017-09-23 (×3): 4 mg via ORAL
  Filled 2017-09-20 (×4): qty 4

## 2017-09-20 MED ORDER — HYDROCHLOROTHIAZIDE 25 MG PO TABS: 25.0000 mg | ORAL_TABLET | Freq: Every day | ORAL | Status: DC

## 2017-09-20 MED ORDER — FUROSEMIDE 10 MG/ML IJ SOLN
INTRAMUSCULAR | Status: AC
  Administered 2017-09-20: 100 mg
  Filled 2017-09-20: qty 2

## 2017-09-20 MED ORDER — CARBOXYMETHYLCELLULOSE SODIUM 1 % OP SOLN: 1.0000 [drp] | Freq: Two times a day (BID) | OPHTHALMIC | Status: DC

## 2017-09-20 MED ORDER — LORATADINE 10 MG PO TABS
10.0000 mg | ORAL_TABLET | Freq: Every day | ORAL | Status: DC
  Administered 2017-09-21 – 2017-09-23 (×3): 10 mg via ORAL
  Filled 2017-09-20 (×3): qty 1

## 2017-09-20 MED ORDER — MEGESTROL ACETATE 40 MG PO TABS
40.0000 mg | ORAL_TABLET | Freq: Every day | ORAL | Status: DC
  Administered 2017-09-21 – 2017-09-23 (×3): 40 mg via ORAL
  Filled 2017-09-20 (×4): qty 1

## 2017-09-20 MED ORDER — MOMETASONE FURO-FORMOTEROL FUM 100-5 MCG/ACT IN AERO: 2.0000 | INHALATION_SPRAY | Freq: Two times a day (BID) | RESPIRATORY_TRACT | Status: DC

## 2017-09-20 MED ORDER — INSULIN ASPART 100 UNIT/ML ~~LOC~~ SOLN
0.0000 [IU] | Freq: Three times a day (TID) | SUBCUTANEOUS | Status: DC
  Administered 2017-09-22: 3 [IU] via SUBCUTANEOUS
  Administered 2017-09-22 – 2017-09-23 (×3): 7 [IU] via SUBCUTANEOUS
  Administered 2017-09-23: 4 [IU] via SUBCUTANEOUS

## 2017-09-20 MED ORDER — FUROSEMIDE 10 MG/ML IJ SOLN: 80.0000 mg | Freq: Once | INTRAMUSCULAR | Status: DC

## 2017-09-20 MED ORDER — TRAMADOL HCL 50 MG PO TABS
50.0000 mg | ORAL_TABLET | Freq: Four times a day (QID) | ORAL | Status: DC | PRN
  Administered 2017-09-23: 50 mg via ORAL
  Filled 2017-09-20: qty 1

## 2017-09-20 MED ORDER — MORPHINE SULFATE (PF) 4 MG/ML IV SOLN
INTRAVENOUS | Status: AC
  Filled 2017-09-20: qty 1

## 2017-09-20 MED ORDER — AZATHIOPRINE 50 MG PO TABS: 100.0000 mg | ORAL_TABLET | Freq: Two times a day (BID) | ORAL | Status: DC

## 2017-09-20 MED ORDER — ASPIRIN EC 81 MG PO TBEC
81.0000 mg | DELAYED_RELEASE_TABLET | Freq: Every day | ORAL | Status: DC
  Administered 2017-09-21 – 2017-09-23 (×3): 81 mg via ORAL
  Filled 2017-09-20 (×3): qty 1

## 2017-09-20 MED ORDER — IPRATROPIUM-ALBUTEROL 0.5-2.5 (3) MG/3ML IN SOLN: 3.0000 mL | RESPIRATORY_TRACT | Status: DC | PRN

## 2017-09-20 MED ORDER — DOXEPIN HCL 50 MG PO CAPS
50.0000 mg | ORAL_CAPSULE | Freq: Every evening | ORAL | Status: DC | PRN
  Filled 2017-09-20: qty 2

## 2017-09-20 MED ORDER — INSULIN ASPART 100 UNIT/ML ~~LOC~~ SOLN
0.0000 [IU] | Freq: Every day | SUBCUTANEOUS | Status: DC
  Administered 2017-09-21 – 2017-09-22 (×2): 2 [IU] via SUBCUTANEOUS

## 2017-09-20 MED ORDER — ACETAMINOPHEN 325 MG PO TABS: 650.0000 mg | ORAL_TABLET | ORAL | Status: DC | PRN

## 2017-09-20 MED ORDER — ONDANSETRON HCL 4 MG/2ML IJ SOLN: 4.0000 mg | Freq: Four times a day (QID) | INTRAMUSCULAR | Status: DC | PRN

## 2017-09-20 MED ORDER — PREGABALIN 25 MG PO CAPS: 75.0000 mg | ORAL_CAPSULE | Freq: Three times a day (TID) | ORAL | Status: DC

## 2017-09-20 MED ORDER — NITROGLYCERIN IN D5W 200-5 MCG/ML-% IV SOLN
0.0000 ug/min | Freq: Once | INTRAVENOUS | Status: AC
  Administered 2017-09-20: 10 ug/min via INTRAVENOUS

## 2017-09-20 MED ORDER — FUROSEMIDE 10 MG/ML IJ SOLN
60.0000 mg | Freq: Two times a day (BID) | INTRAMUSCULAR | Status: DC
  Administered 2017-09-21 – 2017-09-23 (×5): 60 mg via INTRAVENOUS
  Filled 2017-09-20 (×5): qty 6

## 2017-09-20 MED ORDER — MORPHINE SULFATE 15 MG PO TABS: 15.0000 mg | ORAL_TABLET | Freq: Four times a day (QID) | ORAL | Status: DC | PRN

## 2017-09-20 MED ORDER — CYCLOSPORINE 0.05 % OP EMUL
1.0000 [drp] | Freq: Three times a day (TID) | OPHTHALMIC | Status: DC
  Administered 2017-09-22 – 2017-09-23 (×4): 1 [drp] via OPHTHALMIC
  Filled 2017-09-20 (×8): qty 1

## 2017-09-20 MED ORDER — BUPROPION HCL 100 MG PO TABS
100.0000 mg | ORAL_TABLET | Freq: Every day | ORAL | Status: DC
  Administered 2017-09-22 – 2017-09-23 (×2): 100 mg via ORAL
  Filled 2017-09-20 (×4): qty 1

## 2017-09-20 MED ORDER — CYCLOBENZAPRINE HCL 10 MG PO TABS: 10.0000 mg | ORAL_TABLET | Freq: Two times a day (BID) | ORAL | Status: DC | PRN

## 2017-09-20 MED ORDER — FAMOTIDINE 20 MG PO TABS
20.0000 mg | ORAL_TABLET | Freq: Two times a day (BID) | ORAL | Status: DC
  Administered 2017-09-21 – 2017-09-23 (×5): 20 mg via ORAL
  Filled 2017-09-20 (×5): qty 1

## 2017-09-20 MED ORDER — LIDOCAINE 5 % EX PTCH: 1.0000 | MEDICATED_PATCH | CUTANEOUS | Status: DC

## 2017-09-20 MED ORDER — DICLOFENAC SODIUM 1 % TD GEL: 2.0000 g | Freq: Four times a day (QID) | TRANSDERMAL | Status: DC | PRN

## 2017-09-20 MED ORDER — MORPHINE SULFATE (PF) 4 MG/ML IV SOLN
2.0000 mg | INTRAVENOUS | Status: DC
  Administered 2017-09-20 – 2017-09-23 (×16): 2 mg via INTRAVENOUS
  Filled 2017-09-20 (×14): qty 1

## 2017-09-20 MED ORDER — LOSARTAN POTASSIUM 50 MG PO TABS
100.0000 mg | ORAL_TABLET | Freq: Every day | ORAL | Status: DC
  Administered 2017-09-21 – 2017-09-23 (×3): 100 mg via ORAL
  Filled 2017-09-20 (×4): qty 2

## 2017-09-20 MED ORDER — SODIUM CHLORIDE 0.9 % IV SOLN: 250.0000 mL | INTRAVENOUS | Status: DC | PRN

## 2017-09-20 MED ORDER — HYDROXYCHLOROQUINE SULFATE 200 MG PO TABS: 200.0000 mg | ORAL_TABLET | Freq: Two times a day (BID) | ORAL | Status: DC

## 2017-09-20 MED ORDER — CLOTRIMAZOLE-BETAMETHASONE 1-0.05 % EX CREA: 1.0000 "application " | TOPICAL_CREAM | Freq: Every day | CUTANEOUS | Status: DC | PRN

## 2017-09-20 MED ORDER — CARVEDILOL 12.5 MG PO TABS: 37.5000 mg | ORAL_TABLET | Freq: Two times a day (BID) | ORAL | Status: DC

## 2017-09-20 MED ORDER — SODIUM CHLORIDE 0.9% FLUSH
3.0000 mL | Freq: Two times a day (BID) | INTRAVENOUS | Status: DC
  Administered 2017-09-21 – 2017-09-23 (×5): 3 mL via INTRAVENOUS

## 2017-09-20 MED ORDER — NITROGLYCERIN IN D5W 200-5 MCG/ML-% IV SOLN
INTRAVENOUS | Status: AC
  Filled 2017-09-20: qty 250

## 2017-09-20 NOTE — H&P (Signed)
[facility] History and Physical  [name] RFF:[id number] DOB: September 29, 1970 DOA: 09/20/2017  PCP: Bartholome Bill, MD  Patient coming from: Home  Chief Complaint: Shortness of breath  HPI: [name] is a 47 y.o. female with a medical history of CHF, diabetes, hypertension, lupus, who presented to the emergency department with sudden onset shortness of breath.  Patient unable to provide much history given that she is currently on BiPAP.  She denies recent cough.  States she has not been sick or ill recently.  Denies any current chest pain, abdominal pain, nausea or vomiting, diarrhea or constipation, headache or dizziness, changes in bowel pattern or problems urinating.  ED Course: Found to be hypoxic, placed on BiPAP.  Patient also noted to have accelerated uncontrolled hypertension and placed on nitroglycerin drip.  Chest x-ray showed pulmonary edema, patient placed on IV Lasix.  TRH called for admission.  Review of Systems:  All other systems reviewed and are negative.   Past Medical History:  Diagnosis Date  . Acute renal failure (HCC)     Intractable nausea vomiting secondary to diabetic gastroparesis causing dehydration and acute renal failure /notes 04/01/2013  . Anginal pain ([place])   . Anxiety   . Bell's palsy 08/09/2010  . CAP (community acquired pneumonia) 10/2011   Archie Endo 11/08/2011  . Chest pain 01/13/2014  . CHF (congestive heart failure) ([place])   . Diabetic gastroparesis associated with type 2 diabetes mellitus ([place])    this is presumed diagnoses, not confirmed by any studies.   . Eczema   . Family history of malignant neoplasm of breast   . Family history of malignant neoplasm of ovary   . GERD (gastroesophageal reflux disease)   . High cholesterol   . Hypertension   . Liver mass 2014   biopsied 03/2013 at [facility], not malignant.  is to undergo radiologic ablation of the mass in sept/October 2014.   . Lupus   . Migraines    "maybe a couple times/yr"  (04/01/2013)  . Obesity   . Obstructive sleep apnea on CPAP 2011  . On home oxygen therapy    "2L 24/7" (02/28/2015)  . SJOGREN'S SYNDROME 08/09/2010  . SLE (systemic lupus erythematosus) ([place])    Archie Endo 12/01/2010  . Stroke [facility]) 2010; 10/2012   "left side is still weak from it, never fully regained full strength; no additions from stroke 10/2012"    Past Surgical History:  Procedure Laterality Date  . BREAST CYST EXCISION Left 08/2005   epidermoid  . CARDIAC CATHETERIZATION  02/07/12  . ECTOPIC PREGNANCY SURGERY  1999  . ESOPHAGOGASTRODUODENOSCOPY N/A 02/26/2013   Procedure: ESOPHAGOGASTRODUODENOSCOPY (EGD);  Surgeon: Irene Shipper, MD;  Location: [facility] ENDOSCOPY;  Service: Endoscopy;  Laterality: N/A;  . ESOPHAGOGASTRODUODENOSCOPY N/A 03/02/2015   Procedure: ESOPHAGOGASTRODUODENOSCOPY (EGD);  Surgeon: Milus Banister, MD;  Location: [place];  Service: Endoscopy;  Laterality: N/A;  . HERNIA REPAIR    . LEFT AND RIGHT HEART CATHETERIZATION WITH CORONARY ANGIOGRAM N/A 02/07/2012   Procedure: LEFT AND RIGHT HEART CATHETERIZATION WITH CORONARY ANGIOGRAM;  Surgeon: Laverda Page, MD;  Location: [facility] CATH LAB;  Service: Cardiovascular;  Laterality: N/A;  . LIVER BIOPSY  03/2013   liver mass/medical hx noted above  . MUSCLE BIOPSY     for lupus/notes 12/01/2010  . UMBILICAL HERNIA REPAIR  1980's    Social History:  reports that she quit smoking about 27 years ago. Her smoking use included cigarettes. She quit after 3.00 years of use. she  has never used smokeless tobacco. She reports that she does not drink alcohol or use drugs.   Allergies  Allergen Reactions  . Metoclopramide Other (See Comments)    Developed restless leg, akathisia type limb movements.   . Codeine Itching, Rash and Other (See Comments)  . Penicillins Itching    Has patient had a PCN reaction causing immediate rash, facial/tongue/throat swelling, SOB or lightheadedness with hypotension: No Has patient had a PCN reaction causing  severe rash involving mucus membranes or skin necrosis: No Has patient had a PCN reaction that required hospitalization: No Has patient had a PCN reaction occurring within the last 10 years:yes If all of the above answers are "NO", then may proceed with Cephalosporin use.  Marland Kitchen Hydrocodone Rash  . Percocet [Oxycodone-Acetaminophen] Hives and Rash    Tolerates dilaudid    Family History  Problem Relation Age of Onset  . Diabetes Mother   . Asthma Mother   . Urticaria Mother   . Breast cancer Maternal Aunt 55       currently 33  . Stomach cancer Maternal Uncle        dx 54s; deceased  . Prostate cancer Maternal Uncle        deceased 57s  . Cancer Maternal Aunt        unk. primary  . Cancer Maternal Aunt        unk. primary  . Ovarian cancer Maternal Aunt        deceased 72s  . Hypertension Other   . Stroke Other   . Arthritis Other   . Ovarian cancer Sister 53       maternal half-sister; RAD51C positive  . Cancer Paternal Aunt        unk. primary; deceased 33s  . Prostate cancer Paternal Uncle        deceased 21s  . Breast cancer Cousin        deceased 26s; daughter of mat uncle with stomach ca  . Breast cancer Maternal Aunt        deceased 61s  . Asthma Brother   . Asthma Brother   . Allergic rhinitis Neg Hx   . Angioedema Neg Hx      Prior to Admission medications   Medication Sig Start Date End Date Taking? Authorizing Provider  ACCU-CHEK SOFTCLIX LANCETS lancets USE TO TEST BLOOD SUGAR 3 TIMES A DAY 09/11/17   Renato Shin, MD  albuterol [facility] HFA) 108 (90 BASE) MCG/ACT inhaler Inhale 2 puffs into the lungs every 6 (six) hours as needed for wheezing or shortness of breath.     [provider]  albuterol (PROVENTIL) (5 MG/ML) 0.5% nebulizer solution Take 2.5 mg by nebulization every 6 (six) hours as needed for wheezing or shortness of breath.  06/24/14   [provider]  alendronate (FOSAMAX) 70 MG tablet Take 70 mg by mouth every Saturday. Take  with a full glass of water on an empty stomach.    [provider]  ALPRAZolam Duanne Moron) 0.5 MG tablet Take 0.5 mg 2 (two) times daily as needed by mouth for anxiety or sleep.  04/19/17   [provider]  aspirin EC 81 MG tablet Take 81 mg by mouth daily.    [provider]  azaTHIOprine (IMURAN) 50 MG tablet Take 100 mg by mouth 2 (two) times daily.     [provider]  Blood Glucose Monitoring Suppl (ACCU-CHEK AVIVA) device Use as instructed 09/11/17 09/11/18  Renato Shin, MD  buPROPion (  WELLBUTRIN) 100 MG tablet Take 100 mg by mouth every morning. 10/20/16   [provider]  carvedilol (COREG) 12.5 MG tablet Take 3 tablets (37.5 mg total) 2 (two) times daily with a meal by mouth. 06/06/17   Geradine Girt, DO  cetirizine (ZYRTEC) 10 MG tablet Take 10 mg by mouth daily.    [provider]  clopidogrel (PLAVIX) 75 MG tablet Take 75 mg by mouth daily.    [provider]  clotrimazole-betamethasone (LOTRISONE) cream Apply 1 application daily as needed topically (itching/breakouts on feet).     [provider]  cyclobenzaprine (FLEXERIL) 10 MG tablet Take 10 mg 2 (two) times daily as needed by mouth.  04/19/17   [provider]  cycloSPORINE (RESTASIS) 0.05 % ophthalmic emulsion Place 1 drop into both eyes 3 (three) times daily.    [provider]  diclofenac sodium (VOLTAREN) 1 % GEL Apply 2-4 g 4 (four) times daily as needed topically (joint pain). 06/06/17   Geradine Girt, DO  doxepin (SINEQUAN) 50 MG capsule Take 50-100 mg by mouth at bedtime as needed (for sleep).    [provider]  etonogestrel-ethinyl estradiol (NUVARING) 0.12-0.015 MG/24HR vaginal ring Place 1 each every 28 (twenty-eight) days vaginally. Insert vaginally and leave in place for 3 consecutive weeks, then remove for 1 week.    [provider]  exenatide (BYETTA) 10 MCG/0.04ML SOPN injection Inject 0.04 mLs (10 mcg total) into  the skin 2 (two) times daily with a meal. And pen needles 2/day 09/11/17   Renato Shin, MD  famotidine (PEPCID) 20 MG tablet Take 20 mg by mouth 2 (two) times daily.    [provider]  glucose blood (ACCU-CHEK AVIVA PLUS) test strip Used to check blood sugar 5x daily. 09/14/17   Renato Shin, MD  hydrALAZINE (APRESOLINE) 50 MG tablet Take 50 mg by mouth 3 (three) times daily.    [provider]  hydrochlorothiazide (HYDRODIURIL) 25 MG tablet Take 25 mg by mouth daily.    [provider]  hydroxychloroquine (PLAQUENIL) 200 MG tablet Take 200 mg by mouth 2 (two) times daily.     [provider]  insulin NPH Human (NOVOLIN N RELION) 100 UNIT/ML injection Inject 2.7 mLs (270 Units total) into the skin every morning. And syringes 3/day 09/11/17   Renato Shin, MD  Insulin Pen Needle 32G X 4 MM MISC Used to inject insulin 1x daily. 06/15/17   Elayne Snare, MD  Insulin Syringe-Needle U-100 (COMFORT ASSIST INSULIN SYRINGE) 31G X 5/16" 1 ML MISC Use one daily with insulin 09/14/17   Renato Shin, MD  lidocaine (LIDODERM) 5 % Place 1 patch onto the skin daily. Remove & Discard patch within 12 hours or as directed by MD 05/28/17   Caccavale, Sophia, PA-C  losartan (COZAAR) 100 MG tablet Take 1 tablet (100 mg total) daily by mouth. 06/07/17   Geradine Girt, DO  megestrol (MEGACE) 40 MG tablet Take 40 mg by mouth daily. 01/30/15   [provider]  metFORMIN (GLUCOPHAGE-XR) 500 MG 24 hr tablet Take 1,000 mg by mouth 2 (two) times daily.    [provider]  mometasone-formoterol (DULERA) 100-5 MCG/ACT AERO Inhale 2 puffs into the lungs 2 (two) times daily. 05/29/17   [provider]  morphine (MSIR) 15 MG tablet Take 1 tablet (15 mg total) every 6 (six) hours as needed by mouth for severe pain. 06/06/17   Geradine Girt, DO  Olopatadine HCl (PAZEO) 0.7 % SOLN  Place 1 drop daily as needed into both eyes (itching/irritation). Pazeo    [provider]  Polyvinyl Alcohol-Povidone (REFRESH OP) Place 1 drop 2 (two) times daily into both eyes.     [provider]  potassium chloride SA (K-DUR,KLOR-CON) 20 MEQ tablet Take 20 mEq by mouth 2 (two) times daily.    [provider]  pravastatin (PRAVACHOL) 10 MG tablet Take 10 mg by mouth daily.    [provider]  predniSONE (DELTASONE) 1 MG tablet Take 4 mg by mouth daily. 04/21/17   [provider]  pregabalin (LYRICA) 75 MG capsule Take 75 mg 3 (three) times daily by mouth.  01/28/15   [provider]  torsemide (DEMADEX) 20 MG tablet Take 1 tablet (20 mg total) by mouth 2 (two) times daily. 04/06/13   Regalado, Belkys A, MD  traMADol (ULTRAM) 50 MG tablet TAKE 1 TO 2 TABLETS BY MOUTH EVERY 6 HOURS AS NEEDED FOR PAIN**PA REQ** 08/17/17   Mcarthur Rossetti, MD  promethazine (PHENERGAN) 25 MG tablet Take 1 tablet (25 mg total) by mouth every 8 (eight) hours as needed for nausea or vomiting. 01/05/16 01/05/16  Dalia Heading, PA-C    Physical Exam: Vitals:   09/20/17 1537 09/20/17 1544  BP: 137/68   Pulse: (!) 127 (!) 117  Resp: (!) 24 20  SpO2: 99% 100%     General: Well developed, well nourished, NAD, appears stated age, morbidly obese  HEENT: NCAT, PERRLA, EOMI, Anicteic Sclera, mucous membranes moist.  BiPAP mask in place  Neck: Supple, no masses.  Unable to appreciate JVD.  Cardiovascular: S1 S2 auscultated, no rubs, murmurs or gallops.  Tachycardia  Respiratory: Diminished breath sounds with prolonged expiratory wheezing, scattered crackles.  Sounds are distant due to body habitus  Abdomen: Soft, morbidly obese, nontender, nondistended, + bowel sounds  Extremities: warm dry without cyanosis clubbing.  Mild lower extremity edema bilaterally  Neuro: AAOx3, cranial nerves grossly intact.  Strength mildly decreased on the left  Skin: Without rashes exudates or nodules  Psych: Normal affect and demeanor with intact judgement and  insight  Labs on Admission: I have personally reviewed following labs and imaging studies CBC: Recent Labs  Lab 09/20/17 1521 09/20/17 1527  WBC 14.7*  --   NEUTROABS 8.6*  --   HGB 11.5* 13.6  HCT 37.5 40.0  MCV 90.6  --   PLT 362  --    Basic Metabolic Panel: Recent Labs  Lab 09/20/17 1521 09/20/17 1527  NA 141 141  K 3.7 3.6  CL 105 104  CO2 20*  --   GLUCOSE 217* 201*  BUN 12 13  CREATININE 1.23* 1.00  CALCIUM 8.6*  --    GFR: Estimated Creatinine Clearance: 124.4 mL/min (by C-G formula based on SCr of 1 mg/dL). Liver Function Tests: Recent Labs  Lab 09/20/17 1521  AST 27  ALT 8*  ALKPHOS 90  BILITOT 0.4  PROT 6.6  ALBUMIN 2.6*   No results for input(s): LIPASE, AMYLASE in the last 168 hours. No results for input(s): AMMONIA in the last 168 hours. Coagulation Profile: No results for input(s): INR, PROTIME in the last 168 hours. Cardiac Enzymes: No results for input(s): CKTOTAL, CKMB, CKMBINDEX, TROPONINI in the last 168 hours. BNP (last 3 results) No results for input(s): PROBNP in the last 8760 hours. HbA1C: No results for input(s): HGBA1C in the last 72 hours. CBG: No results for input(s): GLUCAP in the last 168 hours. Lipid Profile: No results  for input(s): CHOL, HDL, LDLCALC, TRIG, CHOLHDL, LDLDIRECT in the last 72 hours. Thyroid Function Tests: No results for input(s): TSH, T4TOTAL, FREET4, T3FREE, THYROIDAB in the last 72 hours. Anemia Panel: No results for input(s): VITAMINB12, FOLATE, FERRITIN, TIBC, IRON, RETICCTPCT in the last 72 hours. Urine analysis:    Component Value Date/Time   COLORURINE YELLOW 05/28/2017 1229   APPEARANCEUR CLEAR 05/28/2017 1229   LABSPEC 1.026 05/28/2017 1229   PHURINE 6.0 05/28/2017 1229   GLUCOSEU >=500 (A) 05/28/2017 1229   HGBUR MODERATE (A) 05/28/2017 1229   BILIRUBINUR NEGATIVE 05/28/2017 [place] 05/28/2017 1229   PROTEINUR NEGATIVE 05/28/2017 1229   UROBILINOGEN 0.2 02/27/2015 1733     NITRITE NEGATIVE 05/28/2017 1229   LEUKOCYTESUR MODERATE (A) 05/28/2017 1229   Sepsis Labs: _0 (procalcitonin:4,lacticidven:4) )No results found for this or any previous visit (from the past 240 hour(s)).   Radiological Exams on Admission: Dg Chest Port 1 View  Result Date: 09/20/2017 CLINICAL DATA:  Respiratory distress EXAM: PORTABLE CHEST 1 VIEW COMPARISON:  06/05/2017 FINDINGS: Stable cardiomegaly is noted. Diffuse bilateral fluffy infiltrates are noted likely related to pulmonary edema. No sizable effusion is seen. No pneumothorax or bony abnormality is noted. IMPRESSION: Bilateral fluffy infiltrates likely related to pulmonary edema. Electronically Signed   By: Inez Catalina M.D.   On: 09/20/2017 15:43    EKG: Independently reviewed.  Sinus tachycardia rate 137  Assessment/Plan  Acute combined systolic and diastolic CHF exacerbation -CXR reviewed and showed pulmonary edema -Last echocardiogram 06/05/2017, showed an EF of 97-98%, grade 1 diastolic dysfunction -BNP 263.3 -Hold home torsemide and HCTZ -Will place on IV Lasix 60 mg IV twice daily -Monitor intake and output, daily weights -Obtain repeat echocardiogram  Acute hypoxic respiratory failure -Likely secondary to CHF exacerbation -Oxygen saturation down to 88% on room air -ABG showed a pH of 7.324, PO2 146, CO2 45 -Currently on BiPAP  -will place on nebulizer treatments as needed -Question the possibility of PE given patient's sudden onset of shortness of breath. -Will order DDimmer and CTA chest if DDimer is elevated  Accelerated hypertension/essential hypertension -Patient was placed on nitro drip in the emergency department, as BP was 178/132 -Continue Coreg, hydralazine, losartan -Hold HCTZ -Patient will be on IV Lasix -BP has now improved, currently 119/52, will discontinue nitro drip and continue to monitor closely  Lupus -Continue Plaquenil, prednisone, azathioprine  Diabetes mellitus, type  II -will hold metformin, byetta -place on insulin sliding scale CBG monitoring  -Last hemoglobin A1c was 9.8 on 09/11/2017  Morbid obesity -Patient will need to follow-up with her PCP to discuss lifestyle modifications including surgical intervention, exercise and diet  History of CVA -Continue aspirin, Plavix, statin Anxiety/depression Continue Wellbutrin, Xanax  Obstructive sleep apnea -Currently on BiPAP  Chronic pain -Continue, home dose of morphine and tramadol, Flexeril  DVT prophylaxis: Lovenox  Code Status: Full  Family Communication: Friend at bedside.  Admission, patients condition and plan of care including tests being ordered have been discussed with the patient and friend who indicate understanding and agree with the plan and Code Status.  Disposition Plan: Admitted, home when stable  Consults called: None  Admission status: Inpatient.  Patient will need IV diuresis given severity of her respiratory failure as well as CHF at this time.  Patient will be admitted to stepdown unit for better monitoring of her blood pressure and possibility of nitro glycerin drip.  Patient is at high risk for decompensation, given her multiple comorbidities including obesity, CHF.  Time spent: 70 minutes    D.O. [facility] Pager [phone]  If 7PM-7AM, please contact night-coverage [email or web address] Password [facility] 09/20/2017, 5:57 PM

## 2017-09-20 NOTE — ED Provider Notes (Signed)
[facility] EMERGENCY DEPARTMENT Provider Note   CSN: [id number] Arrival date & time: 09/20/17  1507     History   Chief Complaint Chief Complaint  Patient presents with  . Respiratory Distress    HPI [name] is a 47 y.o. female.CC: Respiratory distress.  HPI  [name] is a 47 y.o. female with medical history significant of SLE, sjogrens, DM2, HTN, diastolic CHF.   47 year old female.  Was at home today.  Per her friend that accompanies her she was well this morning and had rather sudden onset of shortness of breath while at home.  EMS was called.  She was found to be dyspneic and hypoxemic and could offer no details to them.  Sats of 70.  Sinus tachycardia.  Sugar of 130.  Last admission was November with CT showing possible PE but with ultrasound and V/Q not showing PE.  Diastolic dysfunction but not on Lasix.  Apparently is on home O2.   Echo: 06/2017   Limited by body habitus. - Left ventricle: The cavity size was normal. Wall thickness was   increased in a pattern of moderate LVH. Systolic function was   mildly reduced. The estimated ejection fraction was in the range   of 45% to 50%. Diffuse hypokinesis. Doppler parameters are   consistent with abnormal left ventricular relaxation (grade 1   diastolic dysfunction). The E/e&' ratio is between 8-15,   suggesting indeterminate LV filling pressure. Ejection fraction   (MOD, 2-plane): 45%.  Past Medical History:  Diagnosis Date  . Acute renal failure (HCC)     Intractable nausea vomiting secondary to diabetic gastroparesis causing dehydration and acute renal failure /notes 04/01/2013  . Anginal pain ([place])   . Anxiety   . Bell's palsy 08/09/2010  . CAP (community acquired pneumonia) 10/2011   Archie Endo 11/08/2011  . Chest pain 01/13/2014  . CHF (congestive heart failure) ([place])   . Diabetic gastroparesis associated with type 2 diabetes mellitus ([place])    this is presumed diagnoses, not confirmed by any  studies.   . Eczema   . Family history of malignant neoplasm of breast   . Family history of malignant neoplasm of ovary   . GERD (gastroesophageal reflux disease)   . High cholesterol   . Hypertension   . Liver mass 2014   biopsied 03/2013 at [facility], not malignant.  is to undergo radiologic ablation of the mass in sept/October 2014.   . Lupus   . Migraines    "maybe a couple times/yr" (04/01/2013)  . Obesity   . Obstructive sleep apnea on CPAP 2011  . On home oxygen therapy    "2L 24/7" (02/28/2015)  . SJOGREN'S SYNDROME 08/09/2010  . SLE (systemic lupus erythematosus) ([place])    Archie Endo 12/01/2010  . Stroke [facility]) 2010; 10/2012   "left side is still weak from it, never fully regained full strength; no additions from stroke 10/2012"    Patient Active Problem List   Diagnosis Date Noted  . CHF exacerbation ([place]) 09/20/2017  . Costochondritis 06/06/2017  . Chest pain 06/04/2017  . Diabetes ([place]) 09/19/2015  . Intractable pain 02/27/2015  . Radiculopathy of cervical spine   . Severe headache 03/05/2014  . Family history of malignant neoplasm of breast   . Family history of malignant neoplasm of ovary   . Intractable nausea and vomiting 11/11/2013  . Septate uterus 09/08/2013  . Diabetic gastroparesis ([place]) 04/28/2013  . Constipation due to pain medication 04/21/2013  . Odynophagia 04/04/2013  .  Liver mass 02/28/2013  . Reflux esophagitis 02/26/2013  . Morbid obesity ([place]) 10/10/2012  . Chronic diastolic CHF (congestive heart failure) ([place]) 10/09/2012  . Cerebral infarction ([place]) 10/09/2012  . Chronic respiratory failure with hypoxia ([place]) 10/09/2012  . Clinical polymyositis syndrome ([place]) 09/06/2012  . Mononeuritis of lower limb 01/12/2012  . Shortness of breath 11/08/2011  . HTN (hypertension) 11/08/2011  . Systolic CHF, acute on chronic ([place]) 11/08/2011  . HLD (hyperlipidemia) 07/19/2011  . Bell's palsy 08/09/2010  . SLE 08/09/2010  . [place] SYNDROME 08/09/2010     Past Surgical History:  Procedure Laterality Date  . BREAST CYST EXCISION Left 08/2005   epidermoid  . CARDIAC CATHETERIZATION  02/07/12  . ECTOPIC PREGNANCY SURGERY  1999  . ESOPHAGOGASTRODUODENOSCOPY N/A 02/26/2013   Procedure: ESOPHAGOGASTRODUODENOSCOPY (EGD);  Surgeon: Irene Shipper, MD;  Location: [facility] ENDOSCOPY;  Service: Endoscopy;  Laterality: N/A;  . ESOPHAGOGASTRODUODENOSCOPY N/A 03/02/2015   Procedure: ESOPHAGOGASTRODUODENOSCOPY (EGD);  Surgeon: Milus Banister, MD;  Location: [facility];  Service: Endoscopy;  Laterality: N/A;  . HERNIA REPAIR    . LEFT AND RIGHT HEART CATHETERIZATION WITH CORONARY ANGIOGRAM N/A 02/07/2012   Procedure: LEFT AND RIGHT HEART CATHETERIZATION WITH CORONARY ANGIOGRAM;  Surgeon: Laverda Page, MD;  Location: [facility] CATH LAB;  Service: Cardiovascular;  Laterality: N/A;  . LIVER BIOPSY  03/2013   liver mass/medical hx noted above  . MUSCLE BIOPSY     for lupus/notes 12/01/2010  . UMBILICAL HERNIA REPAIR  1980's    OB History    Gravida Para Term Preterm AB Living   2 1 1   1 1    SAB TAB Ectopic Multiple Live Births       1   1       Home Medications    Prior to Admission medications   Medication Sig Start Date End Date Taking? Authorizing Provider  ACCU-CHEK SOFTCLIX LANCETS lancets USE TO TEST BLOOD SUGAR 3 TIMES A DAY 09/11/17   Renato Shin, MD  albuterol [facility] HFA) 108 (90 BASE) MCG/ACT inhaler Inhale 2 puffs into the lungs every 6 (six) hours as needed for wheezing or shortness of breath.     [provider]  albuterol (PROVENTIL) (5 MG/ML) 0.5% nebulizer solution Take 2.5 mg by nebulization every 6 (six) hours as needed for wheezing or shortness of breath.  06/24/14   [provider]  alendronate (FOSAMAX) 70 MG tablet Take 70 mg by mouth every Saturday. Take with a full glass of water on an empty stomach.    [provider]  ALPRAZolam Duanne Moron) 0.5 MG tablet Take 0.5 mg 2 (two) times daily as needed by mouth for  anxiety or sleep.  04/19/17   [provider]  aspirin EC 81 MG tablet Take 81 mg by mouth daily.    [provider]  azaTHIOprine (IMURAN) 50 MG tablet Take 100 mg by mouth 2 (two) times daily.     [provider]  Blood Glucose Monitoring Suppl (ACCU-CHEK AVIVA) device Use as instructed 09/11/17 09/11/18  Renato Shin, MD  buPROPion [facility]) 100 MG tablet Take 100 mg by mouth every morning. 10/20/16   [provider]  carvedilol (COREG) 12.5 MG tablet Take 3 tablets (37.5 mg total) 2 (two) times daily with a meal by mouth. 06/06/17   Geradine Girt, DO  cetirizine (ZYRTEC) 10 MG tablet Take 10 mg by mouth daily.    [provider]  clopidogrel (PLAVIX) 75 MG tablet Take 75 mg by mouth  daily.    [provider]  clotrimazole-betamethasone (LOTRISONE) cream Apply 1 application daily as needed topically (itching/breakouts on feet).     [provider]  cyclobenzaprine (FLEXERIL) 10 MG tablet Take 10 mg 2 (two) times daily as needed by mouth.  04/19/17   [provider]  cycloSPORINE (RESTASIS) 0.05 % ophthalmic emulsion Place 1 drop into both eyes 3 (three) times daily.    [provider]  diclofenac sodium (VOLTAREN) 1 % GEL Apply 2-4 g 4 (four) times daily as needed topically (joint pain). 06/06/17   Geradine Girt, DO  doxepin (SINEQUAN) 50 MG capsule Take 50-100 mg by mouth at bedtime as needed (for sleep).    [provider]  etonogestrel-ethinyl estradiol (NUVARING) 0.12-0.015 MG/24HR vaginal ring Place 1 each every 28 (twenty-eight) days vaginally. Insert vaginally and leave in place for 3 consecutive weeks, then remove for 1 week.    [provider]  exenatide (BYETTA) 10 MCG/0.04ML SOPN injection Inject 0.04 mLs (10 mcg total) into the skin 2 (two) times daily with a meal. And pen needles 2/day 09/11/17   Renato Shin, MD  famotidine (PEPCID) 20 MG tablet Take 20 mg by mouth 2 (two) times daily.     [provider]  glucose blood (ACCU-CHEK AVIVA PLUS) test strip Used to check blood sugar 5x daily. 09/14/17   Renato Shin, MD  hydrALAZINE (APRESOLINE) 50 MG tablet Take 50 mg by mouth 3 (three) times daily.    [provider]  hydrochlorothiazide (HYDRODIURIL) 25 MG tablet Take 25 mg by mouth daily.    [provider]  hydroxychloroquine (PLAQUENIL) 200 MG tablet Take 200 mg by mouth 2 (two) times daily.     [provider]  insulin NPH Human (NOVOLIN N RELION) 100 UNIT/ML injection Inject 2.7 mLs (270 Units total) into the skin every morning. And syringes 3/day 09/11/17   Renato Shin, MD  Insulin Pen Needle 32G X 4 MM MISC Used to inject insulin 1x daily. 06/15/17   Elayne Snare, MD  Insulin Syringe-Needle U-100 (COMFORT ASSIST INSULIN SYRINGE) 31G X 5/16" 1 ML MISC Use one daily with insulin 09/14/17   Renato Shin, MD  lidocaine (LIDODERM) 5 % Place 1 patch onto the skin daily. Remove & Discard patch within 12 hours or as directed by MD 05/28/17   Caccavale, Sophia, PA-C  losartan (COZAAR) 100 MG tablet Take 1 tablet (100 mg total) daily by mouth. 06/07/17   Geradine Girt, DO  megestrol (MEGACE) 40 MG tablet Take 40 mg by mouth daily. 01/30/15   [provider]  metFORMIN (GLUCOPHAGE-XR) 500 MG 24 hr tablet Take 1,000 mg by mouth 2 (two) times daily.    [provider]  mometasone-formoterol (DULERA) 100-5 MCG/ACT AERO Inhale 2 puffs into the lungs 2 (two) times daily. 05/29/17   [provider]  morphine (MSIR) 15 MG tablet Take 1 tablet (15 mg total) every 6 (six) hours as needed by mouth for severe pain. 06/06/17   Geradine Girt, DO  Olopatadine HCl (PAZEO) 0.7 % SOLN Place 1 drop daily as needed into both eyes (itching/irritation). Pazeo    [provider]  Polyvinyl Alcohol-Povidone (REFRESH OP) Place 1 drop 2 (two) times daily into both eyes.     [provider]  potassium chloride SA (K-DUR,KLOR-CON) 20  MEQ tablet Take 20 mEq by mouth 2 (two) times daily.    [provider]  pravastatin (PRAVACHOL) 10 MG tablet Take 10 mg by mouth  daily.    [provider]  predniSONE (DELTASONE) 1 MG tablet Take 4 mg by mouth daily. 04/21/17   [provider]  pregabalin (LYRICA) 75 MG capsule Take 75 mg 3 (three) times daily by mouth.  01/28/15   [provider]  torsemide (DEMADEX) 20 MG tablet Take 1 tablet (20 mg total) by mouth 2 (two) times daily. 04/06/13   Regalado, Belkys A, MD  traMADol (ULTRAM) 50 MG tablet TAKE 1 TO 2 TABLETS BY MOUTH EVERY 6 HOURS AS NEEDED FOR PAIN**PA REQ** 08/17/17   Mcarthur Rossetti, MD  promethazine (PHENERGAN) 25 MG tablet Take 1 tablet (25 mg total) by mouth every 8 (eight) hours as needed for nausea or vomiting. 01/05/16 01/05/16  Dalia Heading, PA-C    Family History Family History  Problem Relation Age of Onset  . Diabetes Mother   . Asthma Mother   . Urticaria Mother   . Breast cancer Maternal Aunt 104       currently 67  . Stomach cancer Maternal Uncle        dx 26s; deceased  . Prostate cancer Maternal Uncle        deceased 82s  . Cancer Maternal Aunt        unk. primary  . Cancer Maternal Aunt        unk. primary  . Ovarian cancer Maternal Aunt        deceased 64s  . Hypertension Other   . Stroke Other   . Arthritis Other   . Ovarian cancer Sister 9       maternal half-sister; RAD51C positive  . Cancer Paternal Aunt        unk. primary; deceased 78s  . Prostate cancer Paternal Uncle        deceased 71s  . Breast cancer Cousin        deceased 22s; daughter of mat uncle with stomach ca  . Breast cancer Maternal Aunt        deceased 43s  . Asthma Brother   . Asthma Brother   . Allergic rhinitis Neg Hx   . Angioedema Neg Hx     Social History Social History   Tobacco Use  . Smoking status: Former Smoker    Years: 3.00    Types: Cigarettes    Last attempt to quit: 06/01/1990    Years since quitting:  27.3  . Smokeless tobacco: Never Used  Substance Use Topics  . Alcohol use: No    Alcohol/week: 0.0 oz  . Drug use: No     Allergies   Metoclopramide; Codeine; Penicillins; Hydrocodone; and Percocet [oxycodone-acetaminophen]   Review of Systems Review of Systems  Unable to perform ROS: Acuity of condition     Physical Exam Updated Vital Signs BP (!) 128/99   Pulse (!) 108   Resp (!) 39   Wt (!) 197.9 kg (436 lb 4.7 oz)   LMP 03/24/2017   SpO2 97%   BMI 66.34 kg/m   Physical Exam  Constitutional:  Morbidly obese African American female.  Tachypneic.  Eyes closed.  Will open them to voice.  Is on BiPAP.  Unable to respond verbally.  HENT:  Conjunctive are not pale.  No scleral icterus.  Eyes:  Pupils symmetric reactive.  Neck:  Large caliber neck.  Unable to appreciate JVD.  Cardiovascular:  Sinus tachycardia.  Trace symmetric bilateral lower extremity edema  Pulmonary/Chest:  Crackles diffusely.  Prolongation and wheezing.  Distress.  Globally diminished breath sound.  Question distant breath sounds secondary to body habitus.  Abdominal:  Protuberant benign.  Neurological:  Moves all 4 extremities to voice command.  Skin:  Diaphoretic     ED Treatments / Results  Labs (all labs ordered are listed, but only abnormal results are displayed) Labs Reviewed  CBC WITH DIFFERENTIAL/PLATELET - Abnormal; Notable for the following components:      Result Value   WBC 14.7 (*)    Hemoglobin 11.5 (*)    Neutro Abs 8.6 (*)    Lymphs Abs 5.0 (*)    All other components within normal limits  COMPREHENSIVE METABOLIC PANEL - Abnormal; Notable for the following components:   CO2 20 (*)    Glucose, Bld 217 (*)    Creatinine, Ser 1.23 (*)    Calcium 8.6 (*)    Albumin 2.6 (*)    ALT 8 (*)    GFR calc non Af Amer 51 (*)    GFR calc Af Amer 60 (*)    Anion gap 16 (*)    All other components within normal limits  BRAIN NATRIURETIC PEPTIDE - Abnormal; Notable for the  following components:   B Natriuretic Peptide 263.3 (*)    All other components within normal limits  CBC - Abnormal; Notable for the following components:   WBC 18.5 (*)    Hemoglobin 11.6 (*)    All other components within normal limits  CREATININE, SERUM - Abnormal; Notable for the following components:   Creatinine, Ser 1.05 (*)    All other components within normal limits  D-DIMER, QUANTITATIVE (NOT AT [facility]) - Abnormal; Notable for the following components:   D-Dimer, Quant 5.29 (*)    All other components within normal limits  I-STAT CHEM 8, ED - Abnormal; Notable for the following components:   Glucose, Bld 201 (*)    All other components within normal limits  I-STAT ARTERIAL BLOOD GAS, ED - Abnormal; Notable for the following components:   pH, Arterial 7.324 (*)    pO2, Arterial 146.0 (*)    Acid-base deficit 3.0 (*)    All other components within normal limits  BLOOD GAS, ARTERIAL  BASIC METABOLIC PANEL  CBC  I-STAT TROPONIN, ED    EKG  EKG Interpretation  Date/Time:  Wednesday September 20 2017 15:09:54 EST Ventricular Rate:  137 PR Interval:    QRS Duration: 94 QT Interval:  300 QTC Calculation: 453 R Axis:   98 Text Interpretation:  Sinus tachycardia Probable left atrial enlargement Anterior infarct, old Nonspecific repol abnormality, diffuse leads Confirmed by Tanna Furry [phone]) on 09/20/2017 3:18:47 PM       Radiology Dg Chest Port 1 View  Result Date: 09/20/2017 CLINICAL DATA:  Respiratory distress EXAM: PORTABLE CHEST 1 VIEW COMPARISON:  06/05/2017 FINDINGS: Stable cardiomegaly is noted. Diffuse bilateral fluffy infiltrates are noted likely related to pulmonary edema. No sizable effusion is seen. No pneumothorax or bony abnormality is noted. IMPRESSION: Bilateral fluffy infiltrates likely related to pulmonary edema. Electronically Signed   By: Inez Catalina M.D.   On: 09/20/2017 15:43    Procedures Procedures (including critical care time)  Medications  Ordered in ED Medications  nitroGLYCERIN 0.2 mg/mL in dextrose 5 % infusion (not administered)  furosemide (LASIX) 100 mg in dextrose 5 % 50 mL IVPB (100 mg Intravenous Not Given 09/20/17 1802)  ALPRAZolam (XANAX) tablet 0.5 mg (not administered)  aspirin EC tablet 81 mg (not administered)  azaTHIOprine (IMURAN) tablet 100 mg (not administered)  buPROPion (WELLBUTRIN) tablet 100 mg (  not administered)  carvedilol (COREG) tablet 37.5 mg (not administered)  loratadine (CLARITIN) tablet 10 mg (not administered)  clopidogrel (PLAVIX) tablet 75 mg (not administered)  clotrimazole-betamethasone (LOTRISONE) cream 1 application (not administered)  cyclobenzaprine (FLEXERIL) tablet 10 mg (not administered)  cycloSPORINE (RESTASIS) 0.05 % ophthalmic emulsion 1 drop (not administered)  diclofenac sodium (VOLTAREN) 1 % transdermal gel 2-4 g (not administered)  doxepin (SINEQUAN) capsule 50-100 mg (not administered)  famotidine (PEPCID) tablet 20 mg (not administered)  hydrALAZINE (APRESOLINE) tablet 50 mg (not administered)  hydroxychloroquine (PLAQUENIL) tablet 200 mg (not administered)  lidocaine (LIDODERM) 5 % 1 patch (not administered)  losartan (COZAAR) tablet 100 mg (not administered)  megestrol (MEGACE) tablet 40 mg (not administered)  mometasone-formoterol (DULERA) 100-5 MCG/ACT inhaler 2 puff (not administered)  morphine (MSIR) tablet 15 mg (not administered)  Olopatadine HCl 0.7 % SOLN 1 drop (not administered)  carboxymethylcellulose 1 % ophthalmic solution 1 drop (not administered)  pravastatin (PRAVACHOL) tablet 10 mg (not administered)  predniSONE (DELTASONE) tablet 4 mg (not administered)  pregabalin (LYRICA) capsule 75 mg (not administered)  traMADol (ULTRAM) tablet 50 mg (not administered)  sodium chloride flush (NS) 0.9 % injection 3 mL (not administered)  sodium chloride flush (NS) 0.9 % injection 3 mL (not administered)  0.9 %  sodium chloride infusion (not administered)    acetaminophen (TYLENOL) tablet 650 mg (not administered)  ondansetron (ZOFRAN) injection 4 mg (not administered)  enoxaparin (LOVENOX) injection 40 mg (not administered)  furosemide (LASIX) injection 60 mg (not administered)  insulin aspart (novoLOG) injection 0-20 Units (not administered)  insulin aspart (novoLOG) injection 0-5 Units (not administered)  ipratropium-albuterol (DUONEB) 0.5-2.5 (3) MG/3ML nebulizer solution 3 mL (not administered)  morphine 4 MG/ML injection 2 mg (2 mg Intravenous Given 09/20/17 2016)  morphine 4 MG/ML injection (not administered)  albuterol (PROVENTIL,VENTOLIN) solution continuous neb (10 mg/hr Nebulization Given 09/20/17 1613)  nitroGLYCERIN 50 mg in dextrose 5 % 250 mL (0.2 mg/mL) infusion (0 mcg/min Intravenous Stopped 09/20/17 1720)  furosemide (LASIX) 10 MG/ML injection (100 mg  Given 09/20/17 1520)  iopamidol (ISOVUE-370) 76 % injection (100 mLs  Contrast Given 09/20/17 2035)     Initial Impression / Assessment and Plan / ED Course  I have reviewed the triage vital signs and the nursing notes.  Pertinent labs & imaging results that were available during my care of the patient were reviewed by me and considered in my medical decision making (see chart for details).    EKG Interpretation  Date/Time:  Wednesday September 20 2017 15:09:54 EST Ventricular Rate:  137 PR Interval:    QRS Duration: 94 QT Interval:  300 QTC Calculation: 453 R Axis:   98 Text Interpretation:  Sinus tachycardia Probable left atrial enlargement Anterior infarct, old Nonspecific repol abnormality, diffuse leads Confirmed by Tanna Furry [phone]) on 09/20/2017 3:18:47 PM  Morbid obese female.  History of diastolic CHF.  No documented PE.  Sudden respiratory distress.  Abnormal pulmonary exam.  Differential diagnosis pneumonia, flash pulmonary edema, CHF, pulmonary embolus, asthma exacerbation.  Patient with no documented history of bronchospasm or COPD and only 3+ smoking history  over 20 years ago.  Was given Solu-Medrol and albuterol by paramedics.  Will continue nebulized albuterol.  Placed on BiPAP 12/5.  Given Lasix 100, started on nitroglycerin drip.  Await imaging studies labs and EKG for assistance.   Patient did not tolerate being weaned off of BiPAP.  Progressively hypoxemic.  PH 7.32, PCO2 45, PO2 146.  BNP 263.  Chest x-ray shows  CHF.  Limited by body habitus.  Placed on nitroglycerin drip.  Blood pressures improved.  Hypoxemia and dyspnea markedly improved.  Discussed with hospitalist.  They are seeing and evaluating the patient.  CRITICAL CARE Performed by: Lolita Patella   Total critical care time: 60 minutes including initiation of pressure ventilation, vasoactive medication infusion.  Multiple re-evaluations.   Critical care time was exclusive of separately billable procedures and treating other patients.  Critical care was necessary to treat or prevent imminent or life-threatening deterioration.  Critical care was time spent personally by me on the following activities: development of treatment plan with patient and/or surrogate as well as nursing, discussions with consultants, evaluation of patient's response to treatment, examination of patient, obtaining history from patient or surrogate, ordering and performing treatments and interventions, ordering and review of laboratory studies, ordering and review of radiographic studies, pulse oximetry and re-evaluation of patient's condition.    Final Clinical Impressions(s) / ED Diagnoses   Final diagnoses:  Acute on chronic right-sided congestive heart failure ([place])  Hypoxemia    ED Discharge Orders    None       Tanna Furry, MD 09/20/17 2042

## 2017-09-20 NOTE — Progress Notes (Signed)
RT transported patient on bipap from ED trauma room B to room 4C76 with no complications. Vitals are stable. RT will continue to monitor.

## 2017-09-20 NOTE — ED Notes (Signed)
Pt arrives to Trauma bay by [facility] in respiratory distress on Cpap, pt is alert but in distress, Changed over to BIPAP. MD Jeneen Rinks at bedside.

## 2017-09-21 ENCOUNTER — Other Ambulatory Visit: Payer: Self-pay

## 2017-09-21 ENCOUNTER — Inpatient Hospital Stay (HOSPITAL_COMMUNITY): Payer: Medicaid Other

## 2017-09-21 ENCOUNTER — Encounter (HOSPITAL_COMMUNITY): Payer: Self-pay

## 2017-09-21 DIAGNOSIS — J9600 Acute respiratory failure, unspecified whether with hypoxia or hypercapnia: Secondary | ICD-10-CM

## 2017-09-21 DIAGNOSIS — I50813 Acute on chronic right heart failure: Secondary | ICD-10-CM

## 2017-09-21 DIAGNOSIS — M329 Systemic lupus erythematosus, unspecified: Secondary | ICD-10-CM

## 2017-09-21 DIAGNOSIS — I10 Essential (primary) hypertension: Secondary | ICD-10-CM

## 2017-09-21 DIAGNOSIS — J181 Lobar pneumonia, unspecified organism: Secondary | ICD-10-CM

## 2017-09-21 DIAGNOSIS — R0902 Hypoxemia: Secondary | ICD-10-CM

## 2017-09-21 LAB — CBC
HEMATOCRIT: 38.8 % (ref 36.0–46.0)
Hemoglobin: 12.3 g/dL (ref 12.0–15.0)
MCH: 27.8 pg (ref 26.0–34.0)
MCHC: 31.7 g/dL (ref 30.0–36.0)
MCV: 87.8 fL (ref 78.0–100.0)
Platelets: 373 10*3/uL (ref 150–400)
RBC: 4.42 MIL/uL (ref 3.87–5.11)
RDW: 14.9 % (ref 11.5–15.5)
WBC: 16.8 10*3/uL — ABNORMAL HIGH (ref 4.0–10.5)

## 2017-09-21 LAB — ECHOCARDIOGRAM COMPLETE
CHL CUP MV DEC (S): 176
E/e' ratio: 19.27
EWDT: 176 ms
FS: 15 % — AB (ref 28–44)
IVS/LV PW RATIO, ED: 0.98
LA ID, A-P, ES: 42 mm
LA diam index: 1.47 cm/m2
LA vol A4C: 66.2 ml
LA vol index: 28.6 mL/m2
LA vol: 81.4 mL
LEFT ATRIUM END SYS DIAM: 42 mm
LV E/e'average: 19.27
LV TDI E'LATERAL: 4.68
LV TDI E'MEDIAL: 11.3
LV e' LATERAL: 4.68 cm/s
LVEEMED: 19.27
LVOT VTI: 18.9 cm
LVOT area: 4.52 cm2
LVOTD: 24 mm
LVOTPV: 89.4 cm/s
LVOTSV: 85 mL
MV Peak grad: 3 mmHg
MV pk A vel: 103 m/s
MV pk E vel: 90.2 m/s
PW: 10.9 mm — AB (ref 0.6–1.1)
Weight: 6980.65 oz

## 2017-09-21 LAB — BASIC METABOLIC PANEL
Anion gap: 13 (ref 5–15)
BUN: 16 mg/dL (ref 6–20)
CO2: 22 mmol/L (ref 22–32)
Calcium: 8.7 mg/dL — ABNORMAL LOW (ref 8.9–10.3)
Chloride: 104 mmol/L (ref 101–111)
Creatinine, Ser: 1.18 mg/dL — ABNORMAL HIGH (ref 0.44–1.00)
GFR calc Af Amer: >60 mL/min (ref >60)
GFR, EST NON AFRICAN AMERICAN: 54 mL/min — AB (ref >60)
GLUCOSE: 117 mg/dL — AB (ref 65–99)
POTASSIUM: 4.2 mmol/L (ref 3.5–5.1)
Sodium: 139 mmol/L (ref 135–145)

## 2017-09-21 LAB — GLUCOSE, CAPILLARY
GLUCOSE-CAPILLARY: 208 mg/dL — AB (ref 65–99)
Glucose-Capillary: 203 mg/dL — ABNORMAL HIGH (ref 65–99)

## 2017-09-21 LAB — CBG MONITORING, ED
GLUCOSE-CAPILLARY: 114 mg/dL — AB (ref 65–99)
GLUCOSE-CAPILLARY: 85 mg/dL (ref 65–99)
Glucose-Capillary: 94 mg/dL (ref 65–99)

## 2017-09-21 LAB — PROCALCITONIN: PROCALCITONIN: 3.76 ng/mL

## 2017-09-21 MED ORDER — CARVEDILOL 25 MG PO TABS
37.5000 mg | ORAL_TABLET | Freq: Two times a day (BID) | ORAL | Status: DC
  Administered 2017-09-21 – 2017-09-23 (×5): 37.5 mg via ORAL
  Filled 2017-09-21: qty 3
  Filled 2017-09-21 (×4): qty 1

## 2017-09-21 MED ORDER — MOMETASONE FURO-FORMOTEROL FUM 100-5 MCG/ACT IN AERO
2.0000 | INHALATION_SPRAY | Freq: Two times a day (BID) | RESPIRATORY_TRACT | Status: DC
  Administered 2017-09-22 – 2017-09-23 (×2): 2 via RESPIRATORY_TRACT
  Filled 2017-09-21: qty 8.8

## 2017-09-21 MED ORDER — SODIUM CHLORIDE 0.9 % IV SOLN
1.0000 g | INTRAVENOUS | Status: DC
  Administered 2017-09-21 – 2017-09-22 (×2): 1 g via INTRAVENOUS
  Filled 2017-09-21 (×3): qty 10

## 2017-09-21 MED ORDER — POLYVINYL ALCOHOL 1.4 % OP SOLN: 1.0000 [drp] | OPHTHALMIC | Status: DC | PRN

## 2017-09-21 MED ORDER — HYDRALAZINE HCL 50 MG PO TABS
50.0000 mg | ORAL_TABLET | Freq: Three times a day (TID) | ORAL | Status: DC
  Administered 2017-09-21 – 2017-09-23 (×7): 50 mg via ORAL
  Filled 2017-09-21: qty 2
  Filled 2017-09-21 (×6): qty 1

## 2017-09-21 MED ORDER — HYDROXYCHLOROQUINE SULFATE 200 MG PO TABS
200.0000 mg | ORAL_TABLET | Freq: Two times a day (BID) | ORAL | Status: DC
  Administered 2017-09-21 – 2017-09-23 (×5): 200 mg via ORAL
  Filled 2017-09-21 (×5): qty 1

## 2017-09-21 MED ORDER — AZITHROMYCIN 250 MG PO TABS
250.0000 mg | ORAL_TABLET | Freq: Every day | ORAL | Status: DC
  Administered 2017-09-22 – 2017-09-23 (×2): 250 mg via ORAL
  Filled 2017-09-21 (×2): qty 1

## 2017-09-21 MED ORDER — AZITHROMYCIN 250 MG PO TABS
500.0000 mg | ORAL_TABLET | Freq: Every day | ORAL | Status: AC
  Administered 2017-09-21: 500 mg via ORAL
  Filled 2017-09-21: qty 2

## 2017-09-21 MED ORDER — PREGABALIN 75 MG PO CAPS
75.0000 mg | ORAL_CAPSULE | Freq: Three times a day (TID) | ORAL | Status: DC
  Administered 2017-09-21 – 2017-09-23 (×7): 75 mg via ORAL
  Filled 2017-09-21 (×3): qty 1
  Filled 2017-09-21: qty 3
  Filled 2017-09-21 (×3): qty 1

## 2017-09-21 MED ORDER — CLOPIDOGREL BISULFATE 75 MG PO TABS
75.0000 mg | ORAL_TABLET | Freq: Every day | ORAL | Status: DC
  Administered 2017-09-21 – 2017-09-23 (×3): 75 mg via ORAL
  Filled 2017-09-21 (×3): qty 1

## 2017-09-21 MED ORDER — PERFLUTREN LIPID MICROSPHERE
1.0000 mL | INTRAVENOUS | Status: AC | PRN
  Administered 2017-09-21: 2 mL via INTRAVENOUS
  Filled 2017-09-21: qty 10

## 2017-09-21 MED ORDER — AZATHIOPRINE 50 MG PO TABS
100.0000 mg | ORAL_TABLET | Freq: Two times a day (BID) | ORAL | Status: DC
  Administered 2017-09-21 – 2017-09-23 (×5): 100 mg via ORAL
  Filled 2017-09-21 (×5): qty 2

## 2017-09-21 NOTE — ED Notes (Signed)
Patient transported to Echo.

## 2017-09-21 NOTE — ED Notes (Signed)
Attempted to wean patient from BiPAP to 6L [place] and patient began desating, c/o worsened chest pain; BiPAP replaced and resumed original settings

## 2017-09-21 NOTE — ED Notes (Signed)
CBG: 61. RN notified.

## 2017-09-21 NOTE — Progress Notes (Signed)
pts sister came to visit and wanted to make staff aware that Lead was recently discovered in the patients home.   Also pt has home health Aid that comes to help with ADLs

## 2017-09-21 NOTE — ED Notes (Signed)
Cleaned patient pt is resting with family at bedside and call bell in reach gave patient ice chips

## 2017-09-21 NOTE — ED Notes (Signed)
Checked patient cbg it was 44 notified RN Claiborne Billings of blood sugar

## 2017-09-21 NOTE — Care Management Note (Addendum)
Case Management Note  Patient Details  Name: Priscilla Kirstein MRN: [id number] Date of Birth: [birth year]  Subjective/Objective:       Pt admitted with SOB             Action/Plan:  PTA independent from home alone - pt has both walker and cane. Pt states her sister will transport her home via private vehicle.   Pt educated on importance of daily weights and low sodium diet.  Pt states she uses 2 liters of nocturnal oxygen supplied by [facility].  Pt voiced concern with concentrator - CM contacted DME company and relayed concern - company arranged concentrator maintenance tomorrow directly with pt  ( pt to arrange for sister to be in the home during visit).  If pt needs continuous oxygen at discharge - agency will need new order   Expected Discharge Date:                  Expected Discharge Plan:  [place]  In-House Referral:     Discharge planning Services  CM Consult  Post Acute Care Choice:    Choice offered to:     DME Arranged:    DME Agency:     HH Arranged:    HH Agency:     Status of Service:     If discussed at [employer], dates discussed:    Additional Comments:  Maryclare Labrador, RN 09/21/2017, 3:03 PM

## 2017-09-21 NOTE — ED Notes (Signed)
States she is feeling better, pain is easing off. Patient remains on o2 at 2 liters. sats remain 95-98%

## 2017-09-21 NOTE — ED Notes (Signed)
Admitting MD at bedside , Bi=pap off patient placed on [place] @2  L

## 2017-09-21 NOTE — ED Notes (Signed)
Patient remains on Bi-Pap  Placed in a hospital bed. States she is much more comfortable.

## 2017-09-21 NOTE — ED Notes (Signed)
Report attempted 

## 2017-09-21 NOTE — ED Notes (Signed)
Lunch tray delivered.

## 2017-09-21 NOTE — Progress Notes (Signed)
  Echocardiogram 2D Echocardiogram has been performed.  [name] L Androw 09/21/2017, 12:06 PM

## 2017-09-21 NOTE — Progress Notes (Signed)
PROGRESS NOTE    [name]   [id number]:[id number]  DOB: March 10, 1971  DOA: 09/20/2017 PCP: Bartholome Bill, MD   Brief Narrative:  [name] is a 47 y.o. female with a medical history of CHF, diabetes, hypertension, lupus, who presented to the emergency department with sudden onset shortness of breath.     Subjective: She tells me that she has had a cough with congestion and some sore throat.  ROS: no complaints of nausea, vomiting, constipation diarrhea, cough, dyspnea or dysuria. No other complaints.   Assessment & Plan:   Principal Problem:   Acute respiratory failure  - started on Lasix for systolic CHF exacerbation yesterday based on CXR findings - CT scan show infiltrates which may be related to pneumonia- she does admit to cough and sore throat today- pro calcitonin is elevated consistent with pneumonia- will start Ceftriaxone and Azithromycin - now off of BiPAP on 2 L O2 which is her baseline - cont IV diuretics for today as well  Active Problems:   SLE - cont Plaquenil, Imuran and Prednisone     HTN (hypertension) -cont Coreg and Losartan and follow BP - HCTZ and Demadex on hold due to use of IV Lasix  DM2 - on Byetta at home- cont SSI    Morbid obesity Body mass index is 66.34 kg/m.     Chronic pain - cont MSIR     DVT prophylaxis: Lovenox Code Status: Full code Family Communication:  Disposition Plan: home when stable Consultants:    Procedures:    Antimicrobials:  Anti-infectives (From admission, onward)   Start     Dose/Rate Route Frequency Ordered Stop   09/22/17 1000  azithromycin (ZITHROMAX) tablet 250 mg     250 mg Oral Daily 09/21/17 1348 09/26/17 0959   09/21/17 1400  cefTRIAXone (ROCEPHIN) 1 g in sodium chloride 0.9 % 100 mL IVPB     1 g 200 mL/hr over 30 Minutes Intravenous Every 24 hours 09/21/17 1348     09/21/17 1400  azithromycin (ZITHROMAX) tablet 500 mg     500 mg Oral Daily 09/21/17 1348 09/22/17 0959   09/21/17 1100   hydroxychloroquine (PLAQUENIL) tablet 200 mg     200 mg Oral 2 times daily 09/21/17 1055     09/20/17 2200  hydroxychloroquine (PLAQUENIL) tablet 200 mg  Status:  Discontinued     200 mg Oral 2 times daily 09/20/17 1756 09/21/17 1055       Objective: Vitals:   09/21/17 1215 09/21/17 1300 09/21/17 1330 09/21/17 1400  BP:  124/65  122/82  Pulse: 96 (!) 102 86 88  Resp: (!) 26 (!) 23 17 (!) 23  SpO2: 99% 99% 100% 99%  Weight:        Intake/Output Summary (Last 24 hours) at 09/21/2017 1453 Last data filed at 09/21/2017 1008 Gross per 24 hour  Intake -  Output 2500 ml  Net -2500 ml   Filed Weights   09/20/17 2017  Weight: (!) 197.9 kg (436 lb 4.7 oz)    Examination: General exam: Appears comfortable  HEENT: PERRLA, oral mucosa moist, no sclera icterus or thrush Respiratory system: Clear to auscultation. Respiratory effort normal. Cardiovascular system: S1 & S2 heard, RRR.  No murmurs  Gastrointestinal system: Abdomen soft, non-tender, nondistended. Normal bowel sound. No organomegaly Central nervous system: Alert and oriented. No focal neurological deficits. Extremities: No cyanosis, clubbing or edema Skin: No rashes or ulcers Psychiatry:  Mood & affect appropriate.     Data Reviewed: I have  personally reviewed following labs and imaging studies  CBC: Recent Labs  Lab 09/20/17 1521 09/20/17 1527 09/20/17 1804 09/21/17 0346  WBC 14.7*  --  18.5* 16.8*  NEUTROABS 8.6*  --   --   --   HGB 11.5* 13.6 11.6* 12.3  HCT 37.5 40.0 36.4 38.8  MCV 90.6  --  89.2 87.8  PLT 362  --  339 240   Basic Metabolic Panel: Recent Labs  Lab 09/20/17 1521 09/20/17 1527 09/20/17 1804 09/21/17 0346  NA 141 141  --  139  K 3.7 3.6  --  4.2  CL 105 104  --  104  CO2 20*  --   --  22  GLUCOSE 217* 201*  --  117*  BUN 12 13  --  16  CREATININE 1.23* 1.00 1.05* 1.18*  CALCIUM 8.6*  --   --  8.7*   GFR: Estimated Creatinine Clearance: 109.3 mL/min (A) (by C-G formula based on  SCr of 1.18 mg/dL (H)). Liver Function Tests: Recent Labs  Lab 09/20/17 1521  AST 27  ALT 8*  ALKPHOS 90  BILITOT 0.4  PROT 6.6  ALBUMIN 2.6*   No results for input(s): LIPASE, AMYLASE in the last 168 hours. No results for input(s): AMMONIA in the last 168 hours. Coagulation Profile: No results for input(s): INR, PROTIME in the last 168 hours. Cardiac Enzymes: No results for input(s): CKTOTAL, CKMB, CKMBINDEX, TROPONINI in the last 168 hours. BNP (last 3 results) No results for input(s): PROBNP in the last 8760 hours. HbA1C: No results for input(s): HGBA1C in the last 72 hours. CBG: Recent Labs  Lab 09/20/17 2153 09/21/17 0213 09/21/17 0826 09/21/17 1230  GLUCAP 110* 114* 94 85   Lipid Profile: No results for input(s): CHOL, HDL, LDLCALC, TRIG, CHOLHDL, LDLDIRECT in the last 72 hours. Thyroid Function Tests: No results for input(s): TSH, T4TOTAL, FREET4, T3FREE, THYROIDAB in the last 72 hours. Anemia Panel: No results for input(s): VITAMINB12, FOLATE, FERRITIN, TIBC, IRON, RETICCTPCT in the last 72 hours. Urine analysis:    Component Value Date/Time   COLORURINE YELLOW 05/28/2017 1229   APPEARANCEUR CLEAR 05/28/2017 1229   LABSPEC 1.026 05/28/2017 1229   PHURINE 6.0 05/28/2017 1229   GLUCOSEU >=500 (A) 05/28/2017 1229   HGBUR MODERATE (A) 05/28/2017 1229   BILIRUBINUR NEGATIVE 05/28/2017 [place] 05/28/2017 1229   PROTEINUR NEGATIVE 05/28/2017 1229   UROBILINOGEN 0.2 02/27/2015 1733   NITRITE NEGATIVE 05/28/2017 1229   LEUKOCYTESUR MODERATE (A) 05/28/2017 1229   Sepsis Labs: @LABRCNTIP (procalcitonin:4,lacticidven:4) )No results found for this or any previous visit (from the past 240 hour(s)).       Radiology Studies: Ct Angio Chest Pe W Or Wo Contrast  Result Date: 09/20/2017 CLINICAL DATA:  Sudden onset dyspnea and hypoxia. EXAM: CT ANGIOGRAPHY CHEST WITH CONTRAST TECHNIQUE: Multidetector CT imaging of the chest was performed using the  standard protocol during bolus administration of intravenous contrast. Multiplanar CT image reconstructions and MIPs were obtained to evaluate the vascular anatomy. CONTRAST:  182mL ISOVUE-370 IOPAMIDOL (ISOVUE-370) INJECTION 76% COMPARISON:  09/20/2017 CXR, chest CT 06/04/2017 FINDINGS: The study is limited by patient body habitus and respiratory motion artifacts. Cardiovascular: Cardiomegaly without pericardial effusion or significant pericardial thickening. No large central pulmonary embolus. Peripheral pulmonary arteries are limited in assessment due to patient body habitus and motion. No aortic aneurysm or dissection. Common arterial trunk of the right brachiocephalic and left common carotid arteries. Mediastinum/Nodes: No mediastinal adenopathy. Patent trachea and mainstem bronchi. Esophagus is  unremarkable. No thyromegaly or mass. Lungs/Pleura: Patchy pulmonary opacities in the right upper, middle and both lower lobes associated trace left and small right pleural effusions. Otherwise there are scattered ground-glass opacities throughout all lobes. Musculoskeletal: No chest wall abnormality. No acute or significant osseous findings. Review of the MIP images confirms the above findings. IMPRESSION: 1. Cardiomegaly with patchy consolidations in the right upper, middle and both lower lobes associated with small right and trace left pleural effusions. Findings raise concern for multilobar pneumonia, stigmata of CHF or possibly combination thereof. 2. No acute pulmonary embolus given limitations due to body habitus and respiratory motion. Electronically Signed   By: Ashley Royalty M.D.   On: 09/20/2017 21:02   Dg Chest Port 1 View  Result Date: 09/20/2017 CLINICAL DATA:  Respiratory distress EXAM: PORTABLE CHEST 1 VIEW COMPARISON:  06/05/2017 FINDINGS: Stable cardiomegaly is noted. Diffuse bilateral fluffy infiltrates are noted likely related to pulmonary edema. No sizable effusion is seen. No pneumothorax or bony  abnormality is noted. IMPRESSION: Bilateral fluffy infiltrates likely related to pulmonary edema. Electronically Signed   By: Inez Catalina M.D.   On: 09/20/2017 15:43      Scheduled Meds: . aspirin EC  81 mg Oral Daily  . azaTHIOprine  100 mg Oral BID  . azithromycin  500 mg Oral Daily   Followed by  . [START ON 09/22/2017] azithromycin  250 mg Oral Daily  . buPROPion  100 mg Oral Q breakfast  . carboxymethylcellulose  1 drop Both Eyes BID  . carvedilol  37.5 mg Oral BID WC  . clopidogrel  75 mg Oral Daily  . cycloSPORINE  1 drop Both Eyes TID  . enoxaparin (LOVENOX) injection  100 mg Subcutaneous Daily  . famotidine  20 mg Oral BID  . furosemide  60 mg Intravenous BID  . hydrALAZINE  50 mg Oral TID  . hydroxychloroquine  200 mg Oral BID  . insulin aspart  0-20 Units Subcutaneous TID WC  . insulin aspart  0-5 Units Subcutaneous QHS  . lidocaine  1 patch Transdermal Q24H  . loratadine  10 mg Oral Daily  . losartan  100 mg Oral Daily  . megestrol  40 mg Oral Daily  . mometasone-formoterol  2 puff Inhalation BID  .  morphine injection  2 mg Intravenous Q4H  . pravastatin  10 mg Oral Daily  . predniSONE  4 mg Oral Daily  . pregabalin  75 mg Oral TID  . sodium chloride flush  3 mL Intravenous Q12H   Continuous Infusions: . sodium chloride    . cefTRIAXone (ROCEPHIN)  IV    . furosemide       LOS: 1 day    Time spent in minutes: 35    Debbe Odea, MD [facility] Pager: [email or web address] Password [facility] 09/21/2017, 2:53 PM

## 2017-09-22 ENCOUNTER — Encounter (HOSPITAL_COMMUNITY): Payer: Self-pay

## 2017-09-22 LAB — GLUCOSE, CAPILLARY
GLUCOSE-CAPILLARY: 123 mg/dL — AB (ref 65–99)
GLUCOSE-CAPILLARY: 227 mg/dL — AB (ref 65–99)
Glucose-Capillary: 211 mg/dL — ABNORMAL HIGH (ref 65–99)
Glucose-Capillary: 204 mg/dL — ABNORMAL HIGH (ref 65–99)

## 2017-09-22 LAB — BASIC METABOLIC PANEL
Anion gap: 11 (ref 5–15)
BUN: 19 mg/dL (ref 6–20)
CALCIUM: 8.4 mg/dL — AB (ref 8.9–10.3)
CHLORIDE: 104 mmol/L (ref 101–111)
CO2: 24 mmol/L (ref 22–32)
Creatinine, Ser: 1.11 mg/dL — ABNORMAL HIGH (ref 0.44–1.00)
GFR calc Af Amer: >60 mL/min (ref >60)
GFR calc non Af Amer: 58 mL/min — ABNORMAL LOW (ref >60)
Glucose, Bld: 159 mg/dL — ABNORMAL HIGH (ref 65–99)
Potassium: 4.2 mmol/L (ref 3.5–5.1)
SODIUM: 139 mmol/L (ref 135–145)

## 2017-09-22 LAB — MRSA PCR SCREENING: MRSA by PCR: NEGATIVE

## 2017-09-22 LAB — PROCALCITONIN: PROCALCITONIN: 3.1 ng/mL

## 2017-09-22 MED ORDER — EXENATIDE 10 MCG/0.04ML ~~LOC~~ SOPN
10.0000 ug | PEN_INJECTOR | Freq: Two times a day (BID) | SUBCUTANEOUS | Status: DC
  Filled 2017-09-22: qty 2.4

## 2017-09-22 NOTE — Progress Notes (Signed)
Patient asked to bring Byetta from home, as we do not stock in hospital per pharmacy. Unable to give PM dose.

## 2017-09-22 NOTE — Progress Notes (Signed)
PROGRESS NOTE    [name]   VHQ:[id number]  DOB: Sep 06, 1970  DOA: 09/20/2017 PCP: Bartholome Bill, MD   Brief Narrative:  [name] is a 47 y.o. female with a medical history of CHF, diabetes, hypertension, lupus, who presented to the emergency department with sudden onset shortness of breath.     Subjective: Has ongoing cough today which sounds congested but she is not able to cough up the sputum. She feels very short of breath when moving around.  ROS: no complaints of nausea, vomiting, constipation diarrhea, cough, dyspnea or dysuria. No other complaints.   Assessment & Plan:   Principal Problem:   Acute respiratory failure  - started on Lasix for systolic CHF exacerbation based on CXR findings - now off of BiPAP on 2 L O2 which is her baseline but still very short of breath with ambulating - Community acquired pneumonia: CT scan further showed infiltrates which may be related to pneumonia- she does admit to cough and sore throat - WBC count was 18 on admisison- pro calcitonin is elevated consistent with pneumonia-  She was started Ceftriaxone and Azithromycin on 2/21 - Acute heart failure:  - cont IV diuretics for today   - 2 D ECHO shows her EF has further declined from 45- 50% to 30-35% and she has global hypokinesis- she is already on an ARB, Coreg, Demadex at home- she may need a heart cath in the future but will current infection, is not a candidate for it at this time- I have discussed this with the patient  Active Problems:   SLE - cont Plaquenil, Imuran and Prednisone     HTN (hypertension) -cont Coreg and Losartan and follow BP - HCTZ and Demadex on hold due to use of IV Lasix  DM2 - on Byetta and NPH at home at home- cont SSI - resume Byetta     Morbid obesity Body mass index is 66.4 kg/m.  - have discussed in detail today the need for weight loss through and appropriate diet and exercise plan- have consulted dietician today     Chronic pain -  cont MSIR    DVT prophylaxis: Lovenox Code Status: Full code Family Communication:  Disposition Plan: home when stable Consultants:   none Procedures:   none Antimicrobials:  Anti-infectives (From admission, onward)   Start     Dose/Rate Route Frequency Ordered Stop   09/22/17 1000  azithromycin (ZITHROMAX) tablet 250 mg     250 mg Oral Daily 09/21/17 1348 09/26/17 0959   09/21/17 2230  cefTRIAXone (ROCEPHIN) 1 g in sodium chloride 0.9 % 100 mL IVPB     1 g 200 mL/hr over 30 Minutes Intravenous Every 24 hours 09/21/17 1348     09/21/17 2230  azithromycin (ZITHROMAX) tablet 500 mg     500 mg Oral Daily 09/21/17 1348 09/21/17 2247   09/21/17 1100  hydroxychloroquine (PLAQUENIL) tablet 200 mg     200 mg Oral 2 times daily 09/21/17 1055     09/20/17 2200  hydroxychloroquine (PLAQUENIL) tablet 200 mg  Status:  Discontinued     200 mg Oral 2 times daily 09/20/17 1756 09/21/17 1055       Objective: Vitals:   09/21/17 1930 09/22/17 0317 09/22/17 0800 09/22/17 1131  BP: 122/65 (!) 144/75 124/82 (!) 118/55  Pulse: 87 89 84 80  Resp: (!) 22 17 20 18   Temp: 98.5 F (36.9 C) 98.3 F (36.8 C)  99 F (37.2 C)  TempSrc: Oral Oral  Oral  SpO2: 100% 99% 100% 94%  Weight:      Height:        Intake/Output Summary (Last 24 hours) at 09/22/2017 1247 Last data filed at 09/22/2017 0900 Gross per 24 hour  Intake 975 ml  Output 850 ml  Net 125 ml   Filed Weights   09/20/17 2017 09/21/17 1857  Weight: (!) 197.9 kg (436 lb 4.7 oz) (!) 198.1 kg (436 lb 11.7 oz)    Examination: General exam: Appears comfortable  HEENT: PERRLA, oral mucosa moist, no sclera icterus or thrush Respiratory system: Clear to auscultation. Respiratory effort normal. Cardiovascular system: S1 & S2 heard, RRR.  No murmurs  Gastrointestinal system: Abdomen soft, non-tender, nondistended. Normal bowel sound. No organomegaly Central nervous system: Alert and oriented. No focal neurological  deficits. Extremities: No cyanosis, clubbing or edema Skin: No rashes or ulcers Psychiatry:  Mood & affect appropriate.    Data Reviewed: I have personally reviewed following labs and imaging studies  CBC: Recent Labs  Lab 09/20/17 1521 09/20/17 1527 09/20/17 1804 09/21/17 0346  WBC 14.7*  --  18.5* 16.8*  NEUTROABS 8.6*  --   --   --   HGB 11.5* 13.6 11.6* 12.3  HCT 37.5 40.0 36.4 38.8  MCV 90.6  --  89.2 87.8  PLT 362  --  339 510   Basic Metabolic Panel: Recent Labs  Lab 09/20/17 1521 09/20/17 1527 09/20/17 1804 09/21/17 0346 09/22/17 0355  NA 141 141  --  139 139  K 3.7 3.6  --  4.2 4.2  CL 105 104  --  104 104  CO2 20*  --   --  22 24  GLUCOSE 217* 201*  --  117* 159*  BUN 12 13  --  16 19  CREATININE 1.23* 1.00 1.05* 1.18* 1.11*  CALCIUM 8.6*  --   --  8.7* 8.4*   GFR: Estimated Creatinine Clearance: 116.3 mL/min (A) (by C-G formula based on SCr of 1.11 mg/dL (H)). Liver Function Tests: Recent Labs  Lab 09/20/17 1521  AST 27  ALT 8*  ALKPHOS 90  BILITOT 0.4  PROT 6.6  ALBUMIN 2.6*   No results for input(s): LIPASE, AMYLASE in the last 168 hours. No results for input(s): AMMONIA in the last 168 hours. Coagulation Profile: No results for input(s): INR, PROTIME in the last 168 hours. Cardiac Enzymes: No results for input(s): CKTOTAL, CKMB, CKMBINDEX, TROPONINI in the last 168 hours. BNP (last 3 results) No results for input(s): PROBNP in the last 8760 hours. HbA1C: No results for input(s): HGBA1C in the last 72 hours. CBG: Recent Labs  Lab 09/21/17 1230 09/21/17 1636 09/21/17 2147 09/22/17 0821 09/22/17 1159  GLUCAP 85 208* 203* 123* 227*   Lipid Profile: No results for input(s): CHOL, HDL, LDLCALC, TRIG, CHOLHDL, LDLDIRECT in the last 72 hours. Thyroid Function Tests: No results for input(s): TSH, T4TOTAL, FREET4, T3FREE, THYROIDAB in the last 72 hours. Anemia Panel: No results for input(s): VITAMINB12, FOLATE, FERRITIN, TIBC, IRON,  RETICCTPCT in the last 72 hours. Urine analysis:    Component Value Date/Time   COLORURINE YELLOW 05/28/2017 1229   APPEARANCEUR CLEAR 05/28/2017 1229   LABSPEC 1.026 05/28/2017 1229   PHURINE 6.0 05/28/2017 1229   GLUCOSEU >=500 (A) 05/28/2017 1229   HGBUR MODERATE (A) 05/28/2017 1229   BILIRUBINUR NEGATIVE 05/28/2017 1229   KETONESUR NEGATIVE 05/28/2017 1229   PROTEINUR NEGATIVE 05/28/2017 1229   UROBILINOGEN 0.2 02/27/2015 1733   NITRITE NEGATIVE 05/28/2017 1229  LEUKOCYTESUR MODERATE (A) 05/28/2017 1229   Sepsis Labs: @LABRCNTIP (procalcitonin:4,lacticidven:4) ) Recent Results (from the past 240 hour(s))  MRSA PCR Screening     Status: None   Collection Time: 09/21/17  6:50 PM  Result Value Ref Range Status   MRSA by PCR NEGATIVE NEGATIVE Final    Comment:        The GeneXpert MRSA Assay (FDA approved for NASAL specimens only), is one component of a comprehensive MRSA colonization surveillance program. It is not intended to diagnose MRSA infection nor to guide or monitor treatment for MRSA infections. Performed at [facility], [place] [address] [place],  [address]          Radiology Studies: Ct Angio Chest Pe W Or Wo Contrast  Result Date: 09/20/2017 CLINICAL DATA:  Sudden onset dyspnea and hypoxia. EXAM: CT ANGIOGRAPHY CHEST WITH CONTRAST TECHNIQUE: Multidetector CT imaging of the chest was performed using the standard protocol during bolus administration of intravenous contrast. Multiplanar CT image reconstructions and MIPs were obtained to evaluate the vascular anatomy. CONTRAST:  14mL ISOVUE-370 IOPAMIDOL (ISOVUE-370) INJECTION 76% COMPARISON:  09/20/2017 CXR, chest CT 06/04/2017 FINDINGS: The study is limited by patient body habitus and respiratory motion artifacts. Cardiovascular: Cardiomegaly without pericardial effusion or significant pericardial thickening. No large central pulmonary embolus. Peripheral pulmonary arteries are limited in  assessment due to patient body habitus and motion. No aortic aneurysm or dissection. Common arterial trunk of the right brachiocephalic and left common carotid arteries. Mediastinum/Nodes: No mediastinal adenopathy. Patent trachea and mainstem bronchi. Esophagus is unremarkable. No thyromegaly or mass. Lungs/Pleura: Patchy pulmonary opacities in the right upper, middle and both lower lobes associated trace left and small right pleural effusions. Otherwise there are scattered ground-glass opacities throughout all lobes. Musculoskeletal: No chest wall abnormality. No acute or significant osseous findings. Review of the MIP images confirms the above findings. IMPRESSION: 1. Cardiomegaly with patchy consolidations in the right upper, middle and both lower lobes associated with small right and trace left pleural effusions. Findings raise concern for multilobar pneumonia, stigmata of CHF or possibly combination thereof. 2. No acute pulmonary embolus given limitations due to body habitus and respiratory motion. Electronically Signed   By: Ashley Royalty M.D.   On: 09/20/2017 21:02   Dg Chest Port 1 View  Result Date: 09/20/2017 CLINICAL DATA:  Respiratory distress EXAM: PORTABLE CHEST 1 VIEW COMPARISON:  06/05/2017 FINDINGS: Stable cardiomegaly is noted. Diffuse bilateral fluffy infiltrates are noted likely related to pulmonary edema. No sizable effusion is seen. No pneumothorax or bony abnormality is noted. IMPRESSION: Bilateral fluffy infiltrates likely related to pulmonary edema. Electronically Signed   By: Inez Catalina M.D.   On: 09/20/2017 15:43      Scheduled Meds: . aspirin EC  81 mg Oral Daily  . azaTHIOprine  100 mg Oral BID  . azithromycin  250 mg Oral Daily  . buPROPion  100 mg Oral Q breakfast  . carvedilol  37.5 mg Oral BID WC  . clopidogrel  75 mg Oral Daily  . cycloSPORINE  1 drop Both Eyes TID  . enoxaparin (LOVENOX) injection  100 mg Subcutaneous Daily  . famotidine  20 mg Oral BID  .  furosemide  60 mg Intravenous BID  . hydrALAZINE  50 mg Oral TID  . hydroxychloroquine  200 mg Oral BID  . insulin aspart  0-20 Units Subcutaneous TID WC  . insulin aspart  0-5 Units Subcutaneous QHS  . loratadine  10 mg Oral Daily  . losartan  100 mg Oral Daily  . megestrol  40 mg Oral Daily  . mometasone-formoterol  2 puff Inhalation BID  .  morphine injection  2 mg Intravenous Q4H  . pravastatin  10 mg Oral Daily  . predniSONE  4 mg Oral Daily  . pregabalin  75 mg Oral TID  . sodium chloride flush  3 mL Intravenous Q12H   Continuous Infusions: . sodium chloride    . cefTRIAXone (ROCEPHIN)  IV Stopped (09/21/17 2318)  . furosemide       LOS: 2 days    Time spent in minutes: 35    Debbe Odea, MD [facility] Pager: [email or web address] Password [email or web address] 09/22/2017, 12:47 PM

## 2017-09-22 NOTE — Care Management Note (Addendum)
Case Management Note  Patient Details  Name: [name] MRN: [id number] Date of Birth: 1971-06-22  Subjective/Objective:    Acute resp failure, CHF                Action/Plan: NCM spoke to pt and her sister lives in the home. She is currently receiving Section 8 housing. She states she currently does not have any heat in the home. She went to DSS and they assisted her with electricity but could not assist with gas bill because she did not have any services at the time. She spoke to [facility] and she owes a previous bill of $750, plus a connection fee $150. Pt gave permission to NCM to contact [place]. NCM/pt contacted [place]. NCM spoke to [facility] rep they were able to escalate situation and they will allow her to pay just the reconnect fee at this time and they will settle with her later on previous bill. Pt will only have to pay a reconnect fee of $150 to get services started. Pt contacted her sister to call [facility] to pay $150 from her acct and arrange services connection on Monday. Pt has RW with seat, cane and oxygen with Adult [facility] Pediatric services. Offered choice for HH/list provided. Pt requested [facility]. Will need HHRN, PT order with [facility]. Contacted [facility] with new referral. Will need walking oxygen saturation. If cont oxygen needed, will need new oxygen order.     Expected Discharge Date:                 Expected Discharge Plan:  [place]  In-House Referral:  NA  Discharge planning Services  CM Consult  Post Acute Care Choice:  Home Health Choice offered to:  Patient  DME Arranged:  N/A DME Agency:  NA  HH Arranged:  RN, PT [place] Agency:  [place]  Status of Service:  In process, will continue to follow  If discussed at Long Length of Stay Meetings, dates discussed:    Additional Comments:  Erenest Rasher, RN 09/22/2017, 4:43 PM

## 2017-09-22 NOTE — Evaluation (Signed)
Physical Therapy Evaluation Patient Details Name: [name] MRN: [id number] DOB: [birth year] Today's Date: 09/22/2017   History of Present Illness  Pt is a 46 y.o. female admitted 09/20/17 with SOB and hypoxia likely secondary to CHF exacerbation. CXR showed pulmonary edema. PMH includes morbid obesity, DM, lupus, CVA, OSA, chronic pain.     Clinical Impression  Pt presents with an overall decrease in functional mobility secondary to above. PTA, pt mod indep walking with SPC; has home aide 7x/wk who assists with ADLs. Today, pt able to take steps to chair with single HHA and supervision for safety; declining further ambulation secondary to lasix and wanting to leave purewick in place. SpO2 >90% on RA. Pt would benefit from continued acute PT services to maximize functional mobility and independence prior to d/c with HHPT services.     Follow Up Recommendations Home health PT;Supervision for mobility/OOB    Equipment Recommendations  None recommended by PT    Recommendations for Other Services       Precautions / Restrictions Precautions Precautions: Fall Restrictions Weight Bearing Restrictions: No      Mobility  Bed Mobility Overal bed mobility: Modified Independent             General bed mobility comments: Increased time and effort, but no physical assist required  Transfers Overall transfer level: Needs assistance Equipment used: None Transfers: Sit to/from Stand Sit to Stand: Supervision         General transfer comment: Supervision for safety; increased time and effort secondary to c/o R knee pain  Ambulation/Gait Ambulation/Gait assistance: Supervision Ambulation Distance (Feet): 2 Feet Assistive device: 1 person hand held assist Gait Pattern/deviations: Step-to pattern;Trunk flexed;Wide base of support Gait velocity: Decreased Gait velocity interpretation: <1.8 ft/sec, indicative of risk for recurrent falls General Gait Details: Pt only willing to amb  to chair secondary to having received lasix and wanting to keep purewick in. Able to take steps from bed to chair with single HHA and supervision for balance  Stairs            Wheelchair Mobility    Modified Rankin (Stroke Patients Only)       Balance Overall balance assessment: Needs assistance   Sitting balance-Leahy Scale: Fair       Standing balance-Leahy Scale: Fair                               Pertinent Vitals/Pain Pain Assessment: Faces Faces Pain Scale: Hurts little more Pain Location: R knee Pain Descriptors / Indicators: Sore Pain Intervention(s): Monitored during session;Limited activity within patient's tolerance    Home Living Family/patient expects to be discharged to:: Private residence Living Arrangements: Alone Available Help at Discharge: Family;Available 24 hours/day Type of Home: House Home Access: Stairs to enter Entrance Stairs-Rails: Left Entrance Stairs-Number of Steps: 4+4 Home Layout: One level Home Equipment: Walker - 4 wheels;Cane - single point;Bedside commode;Shower seat Additional Comments: Sister plans to stay with pt at d/c to provide 24/7 support    Prior Function Level of Independence: Needs assistance   Gait / Transfers Assistance Needed: Mod indep with short distances using SPC, occasionally uses rollator but does not like because it is bulky  ADL's / Homemaking Assistance Needed: Home aide assist 7 day/wk for 2-3 hrs/day. Assists with bathing, dressing, housekeeping, cooking, transfers into/out of shower        Hand Dominance        Extremity/Trunk Assessment  Upper Extremity Assessment Upper Extremity Assessment: Overall WFL for tasks assessed    Lower Extremity Assessment Lower Extremity Assessment: Generalized weakness       Communication   Communication: No difficulties  Cognition Arousal/Alertness: Awake/alert Behavior During Therapy: WFL for tasks assessed/performed Overall Cognitive  Status: Within Functional Limits for tasks assessed                                        General Comments General comments (skin integrity, edema, etc.): SpO2 >90% on RA throughout    Exercises     Assessment/Plan    PT Assessment Patient needs continued PT services  PT Problem List Decreased strength;Decreased activity tolerance;Decreased balance;Decreased mobility       PT Treatment Interventions DME instruction;Gait training;Stair training;Functional mobility training;Therapeutic activities;Therapeutic exercise;Balance training;Patient/family education    PT Goals (Current goals can be found in the Care Plan section)  Acute Rehab PT Goals Patient Stated Goal: Return home with better breathing PT Goal Formulation: With patient Time For Goal Achievement: 10/06/17 Potential to Achieve Goals: Good    Frequency Min 3X/week   Barriers to discharge        Co-evaluation               AM-PAC PT "6 Clicks" Daily Activity  Outcome Measure Difficulty turning over in bed (including adjusting bedclothes, sheets and blankets)?: None Difficulty moving from lying on back to sitting on the side of the bed? : None Difficulty sitting down on and standing up from a chair with arms (e.g., wheelchair, bedside commode, etc,.)?: A Little Help needed moving to and from a bed to chair (including a wheelchair)?: A Little Help needed walking in hospital room?: A Little Help needed climbing 3-5 steps with a railing? : A Lot 6 Click Score: 19    End of Session Equipment Utilized During Treatment: Gait belt Activity Tolerance: Patient tolerated treatment well Patient left: in chair;with call bell/phone within reach Nurse Communication: Mobility status PT Visit Diagnosis: Other abnormalities of gait and mobility (R26.89);Difficulty in walking, not elsewhere classified (R26.2)    Time: 7858-8502 PT Time Calculation (min) (ACUTE ONLY): 15 min   Charges:   PT  Evaluation $PT Eval Moderate Complexity: 1 Mod     PT G Codes:       Mabeline Caras, PT, DPT Acute Rehab Services  Pager: [place] 09/22/2017, 10:39 AM

## 2017-09-22 NOTE — Progress Notes (Signed)
Nutrition Education Note  RD consulted for nutrition education regarding new onset CHF/diabetes.  RD provided "Low Sodium Nutrition Therapy" handout from the [employer]. Reviewed patient's dietary recall. Provided examples on ways to decrease sodium intake in diet. Discouraged intake of processed foods and use of salt shaker. Encouraged fresh fruits and vegetables as well as whole grain sources of carbohydrates to maximize fiber intake.   RD discussed why it is important for patient to adhere to diet recommendations, and emphasized the role of fluids, foods to avoid, and importance of weighing self daily. Teach back method used.  Pt reports she has been told to eat six small meals per day, but she has difficulty with quantity. Talked about eating a balanced plate with protein, carbohydrate, and vegetables with each meal and protein/carbohydrate source with each snack (within AND standards). Pt admits to drinking excessive amounts of juice. Discussed possible alternatives. Made small goal of eating consistently throughout the day and to limit sugar sweetened beverage intake.   Expect fair compliance.  Body mass index is 66.4 kg/m. Pt meets criteria for morbidly obese based on current BMI.  Current diet order is HH/CM, patient is consuming approximately 100% of meals at this time. Labs and medications reviewed. No further nutrition interventions warranted at this time. RD contact information provided. If additional nutrition issues arise, please re-consult RD.   Mariana Single RD, LDN Clinical Nutrition Pager # [phone]

## 2017-09-23 DIAGNOSIS — I10 Essential (primary) hypertension: Secondary | ICD-10-CM

## 2017-09-23 DIAGNOSIS — J9601 Acute respiratory failure with hypoxia: Secondary | ICD-10-CM

## 2017-09-23 DIAGNOSIS — E1143 Type 2 diabetes mellitus with diabetic autonomic (poly)neuropathy: Secondary | ICD-10-CM

## 2017-09-23 DIAGNOSIS — Z794 Long term (current) use of insulin: Secondary | ICD-10-CM

## 2017-09-23 DIAGNOSIS — J189 Pneumonia, unspecified organism: Secondary | ICD-10-CM

## 2017-09-23 LAB — GLUCOSE, CAPILLARY
Glucose-Capillary: 168 mg/dL — ABNORMAL HIGH (ref 65–99)
Glucose-Capillary: 209 mg/dL — ABNORMAL HIGH (ref 65–99)

## 2017-09-23 LAB — BASIC METABOLIC PANEL
ANION GAP: 11 (ref 5–15)
BUN: 16 mg/dL (ref 6–20)
CO2: 24 mmol/L (ref 22–32)
Calcium: 8.2 mg/dL — ABNORMAL LOW (ref 8.9–10.3)
Chloride: 102 mmol/L (ref 101–111)
Creatinine, Ser: 0.97 mg/dL (ref 0.44–1.00)
GFR calc Af Amer: >60 mL/min (ref >60)
GLUCOSE: 167 mg/dL — AB (ref 65–99)
POTASSIUM: 3.6 mmol/L (ref 3.5–5.1)
Sodium: 137 mmol/L (ref 135–145)

## 2017-09-23 LAB — PROCALCITONIN: Procalcitonin: 1.99 ng/mL

## 2017-09-23 MED ORDER — AZITHROMYCIN 250 MG PO TABS: 250.0000 mg | ORAL_TABLET | Freq: Every day | ORAL | 0 refills | Status: AC

## 2017-09-23 MED ORDER — TORSEMIDE 20 MG PO TABS: ORAL_TABLET | ORAL | 0 refills | Status: DC

## 2017-09-23 MED ORDER — CEFUROXIME AXETIL 500 MG PO TABS: 500.0000 mg | ORAL_TABLET | Freq: Two times a day (BID) | ORAL | 0 refills | Status: AC

## 2017-09-23 NOTE — Discharge Summary (Signed)
Physician Discharge Summary  [name] IOX:[id number] DOB: 12/24/1970 DOA: 09/20/2017  PCP: Bartholome Bill, MD  Admit date: 09/20/2017 Discharge date: 09/23/2017  Admitted From: home Disposition:  home   Recommendations for Outpatient Follow-up:  1. Continue to encourage weight loss 2. Encourage compliance with BiPAP  Discharge Condition:  stable   CODE STATUS:  Full code   Consultations:  none    Discharge Diagnoses:  Principal Problem:   Acute respiratory failure ([place]) Active Problems:   Systolic CHF, acute on chronic (HCC)   Multifocal pneumonia   SLE   HTN (hypertension)   Morbid obesity (HCC)   HLD (hyperlipidemia)   DM2    Subjective: Mild dyspnea with exertion. Cough is improving. No chest pain.   Brief Summary: [name] is a 47 y.o.femalewith a medical history ofCHF, diabetes, hypertension, lupus, who presented to the emergency department with sudden onset shortness of breath.  Hospital Course:  Principal Problem:   Acute respiratory failure  - initial CXR suggestive of pulmonary edema but CT scan done later more suspicious for pneumonia- patient admits to a cough and chest congestion - started on IV Lasix for systolic CHF exacerbation based on CXR findings - 2/22 weaned off of BiPAP on 2 L O2 which is her baseline but still very short of breath with ambulating - 2/23 has been weaned off of O2 to room air and is stable for discharge  (A) Community acquired pneumonia in an immune compromised patient:  CT scan further showed infiltrates which may be related to pneumonia- she does admit to cough and sore throat - WBC count was 18 on admission - pro calcitonin was elevated consistent with pneumonia - She was started Ceftriaxone and Azithromycin on 2/21 - symptoms improving, Pro calcitionin dropping  - will transition to Ceftin and cont oral Azithromycin to complete a 7 day course  (B) Acute heart failure:  - has been diuresed with IV Lasix and  will be transitioned back to [facility] on discharge - weight has improved from 198 kg to 184 kg today - 2 D ECHO shows her EF has further declined from 45- 50% to 30-35% and she has global hypokinesis - she is already on an ARB, Coreg, Demadex at home - last Cath in 2013 showed no CAD -  I have discussed the finding with the patient and with Dr Einar Gip who will look at the ECHO and plans to follow up as an outpt - she is advised to limit fluid and salt intake - Demadex dose is being increased for now- needs close f/u as outpt to ensure this is the appropriate dose for her  Active Problems:   SLE - cont Plaquenil, Imuran and Prednisone     HTN (hypertension) -cont Coreg and Losartan and follow BP - HCTZ and Demadex were on hold due to use of IV Lasix  DM2 - on Byetta and NPH at home at home      Morbid obesity Body mass index is 66.4 kg/m.  - have discussed in detail about the absolute need for weight loss through an appropriate diet and exercise plan- have consulted dietician to speak with her about healthy eating habits     Chronic pain - cont MSIR  OSA - only wore the BiPAP for about 1 hr last night    Discharge Exam: Vitals:   09/23/17 0404 09/23/17 0742  BP: (!) 141/75 (!) 146/85  Pulse: 88 83  Resp: (!) 22 17  Temp: 98.1 F (36.7 C)  98.4 F (36.9 C)  SpO2: 97% 96%   Vitals:   09/22/17 2354 09/23/17 0404 09/23/17 0639 09/23/17 0742  BP: 124/60 (!) 141/75  (!) 146/85  Pulse: 85 88  83  Resp: (!) 22 (!) 22  17  Temp: 98.7 F (37.1 C) 98.1 F (36.7 C)  98.4 F (36.9 C)  TempSrc: Oral Oral  Oral  SpO2: 97% 97%  96%  Weight:   (!) 184.1 kg (405 lb 13.9 oz)   Height:        General: Pt is alert, awake, not in acute distress- morbidly obese Cardiovascular: RRR, S1/S2 +, no rubs, no gallops Respiratory: CTA bilaterally, no wheezing, no rhonchi- pulse ox 96% on room air Abdominal: Soft, NT, ND, bowel sounds + Extremities: no edema, no cyanosis   Discharge  Instructions  Discharge Instructions    Diet - low sodium heart healthy   Complete by:  As directed    Diet Carb Modified   Complete by:  As directed    Increase activity slowly   Complete by:  As directed      Allergies as of 09/23/2017      Reactions   Metoclopramide Other (See Comments)   Developed restless leg, akathisia type limb movements.    Codeine Itching, Rash, Other (See Comments)   Penicillins Itching   Has patient had a PCN reaction causing immediate rash, facial/tongue/throat swelling, SOB or lightheadedness with hypotension: No Has patient had a PCN reaction causing severe rash involving mucus membranes or skin necrosis: No Has patient had a PCN reaction that required hospitalization: No Has patient had a PCN reaction occurring within the last 10 years:yes If all of the above answers are "NO", then may proceed with Cephalosporin use.   Hydrocodone Rash   Percocet [oxycodone-acetaminophen] Hives, Rash   Tolerates dilaudid      Medication List    STOP taking these medications   losartan 100 MG tablet Commonly known as:  COZAAR     TAKE these medications   ACCU-CHEK AVIVA device Use as instructed   ACCU-CHEK SOFTCLIX LANCETS lancets USE TO TEST BLOOD SUGAR 3 TIMES A DAY   alendronate 70 MG tablet Commonly known as:  FOSAMAX Take 70 mg by mouth every Saturday. Take with a full glass of water on an empty stomach.   ALPRAZolam 0.5 MG tablet Commonly known as:  XANAX Take 0.5 mg 2 (two) times daily as needed by mouth for anxiety or sleep.   aspirin EC 81 MG tablet Take 81 mg by mouth daily.   azaTHIOprine 50 MG tablet Commonly known as:  IMURAN Take 100 mg by mouth 2 (two) times daily.   azithromycin 250 MG tablet Commonly known as:  ZITHROMAX Take 1 tablet (250 mg total) by mouth daily for 5 days.   buPROPion 100 MG tablet Commonly known as:  WELLBUTRIN Take 100 mg by mouth every morning.   carvedilol 12.5 MG tablet Commonly known as:   COREG Take 3 tablets (37.5 mg total) 2 (two) times daily with a meal by mouth.   cefUROXime 500 MG tablet Commonly known as:  CEFTIN Take 1 tablet (500 mg total) by mouth 2 (two) times daily for 10 days.   cetirizine 10 MG tablet Commonly known as:  ZYRTEC Take 10 mg by mouth daily.   clopidogrel 75 MG tablet Commonly known as:  PLAVIX Take 75 mg by mouth daily.   clotrimazole-betamethasone cream Commonly known as:  LOTRISONE Apply 1 application daily as needed topically (  itching/breakouts on feet).   cyclobenzaprine 10 MG tablet Commonly known as:  FLEXERIL Take 10 mg 2 (two) times daily as needed by mouth.   cycloSPORINE 0.05 % ophthalmic emulsion Commonly known as:  RESTASIS Place 1 drop into both eyes 3 (three) times daily.   diclofenac sodium 1 % Gel Commonly known as:  VOLTAREN Apply 2-4 g 4 (four) times daily as needed topically (joint pain).   doxepin 50 MG capsule Commonly known as:  SINEQUAN Take 50-100 mg by mouth at bedtime as needed (for insomnia).   etonogestrel-ethinyl estradiol 0.12-0.015 MG/24HR vaginal ring Commonly known as:  Corinne 1 each every 28 (twenty-eight) days vaginally. Insert vaginally and leave in place for 3 consecutive weeks, then remove for 1 week.   exenatide 10 MCG/0.04ML Sopn injection Commonly known as:  BYETTA Inject 0.04 mLs (10 mcg total) into the skin 2 (two) times daily with a meal. And pen needles 2/day What changed:  additional instructions   famotidine 20 MG tablet Commonly known as:  PEPCID Take 20 mg by mouth 2 (two) times daily.   glucose blood test strip Commonly known as:  ACCU-CHEK AVIVA PLUS Used to check blood sugar 5x daily.   hydrALAZINE 50 MG tablet Commonly known as:  APRESOLINE Take 50 mg by mouth 3 (three) times daily.   hydrochlorothiazide 25 MG tablet Commonly known as:  HYDRODIURIL Take 25 mg by mouth daily.   hydroxychloroquine 200 MG tablet Commonly known as:  PLAQUENIL Take 200 mg by  mouth 2 (two) times daily.   insulin NPH Human 100 UNIT/ML injection Commonly known as:  NOVOLIN N RELION Inject 2.7 mLs (270 Units total) into the skin every morning. And syringes 3/day   Insulin Pen Needle 32G X 4 MM Misc Used to inject insulin 1x daily.   Insulin Syringe-Needle U-100 31G X 5/16" 1 ML Misc Commonly known as:  COMFORT ASSIST INSULIN SYRINGE Use one daily with insulin   lidocaine 5 % Commonly known as:  LIDODERM Place 1 patch onto the skin daily. Remove & Discard patch within 12 hours or as directed by MD   LYRICA 75 MG capsule Generic drug:  pregabalin Take 75 mg 3 (three) times daily by mouth.   magnesium oxide 400 MG tablet Commonly known as:  MAG-OX Take 400 mg by mouth daily.   megestrol 40 MG tablet Commonly known as:  MEGACE Take 40 mg by mouth daily.   metFORMIN 500 MG 24 hr tablet Commonly known as:  GLUCOPHAGE-XR Take 1,000 mg by mouth 2 (two) times daily.   mometasone-formoterol 100-5 MCG/ACT Aero Commonly known as:  DULERA Inhale 2 puffs into the lungs 2 (two) times daily.   morphine 15 MG tablet Commonly known as:  MSIR Take 1 tablet (15 mg total) every 6 (six) hours as needed by mouth for severe pain.   olmesartan 40 MG tablet Commonly known as:  BENICAR Take 40 mg by mouth daily.   oxybutynin 10 MG 24 hr tablet Commonly known as:  DITROPAN-XL Take 10 mg by mouth daily.   PAZEO 0.7 % Soln Generic drug:  Olopatadine HCl Place 1 drop daily as needed into both eyes (itching/irritation). Pazeo   potassium chloride SA 20 MEQ tablet Commonly known as:  K-DUR,KLOR-CON Take 20 mEq by mouth 2 (two) times daily.   pravastatin 10 MG tablet Commonly known as:  PRAVACHOL Take 10 mg by mouth daily.   predniSONE 1 MG tablet Commonly known as:  DELTASONE Take 3 mg by mouth  daily.   albuterol (2.5 MG/3ML) 0.083% nebulizer solution Commonly known as:  PROVENTIL Take 2.5 mg by nebulization every 6 (six) hours as needed for wheezing. What  changed:  Another medication with the same name was removed. Continue taking this medication, and follow the directions you see here.   PROAIR HFA 108 (90 Base) MCG/ACT inhaler Generic drug:  albuterol Inhale 2 puffs into the lungs every 6 (six) hours as needed for wheezing or shortness of breath. What changed:  Another medication with the same name was removed. Continue taking this medication, and follow the directions you see here.   REFRESH OP Place 1 drop 2 (two) times daily into both eyes.   topiramate 25 MG tablet Commonly known as:  TOPAMAX Take 75 mg by mouth at bedtime.   torsemide 20 MG tablet Commonly known as:  DEMADEX 40 mg in AM and 20 mg in PM What changed:    how much to take  how to take this  when to take this  additional instructions   traMADol 50 MG tablet Commonly known as:  ULTRAM TAKE 1 TO 2 TABLETS BY MOUTH EVERY 6 HOURS AS NEEDED FOR PAIN**PA REQ**      Follow-up Information    Bartholome Bill, MD Follow up in 1 week(s).   Specialty:  Family Medicine Contact information: [address] [address] [phone]        Adrian Prows, MD Follow up in 1 week(s).   Specialty:  Cardiology Contact information: [address] [place] [address] [phone]          Allergies  Allergen Reactions  . Metoclopramide Other (See Comments)    Developed restless leg, akathisia type limb movements.   . Codeine Itching, Rash and Other (See Comments)  . Penicillins Itching    Has patient had a PCN reaction causing immediate rash, facial/tongue/throat swelling, SOB or lightheadedness with hypotension: No Has patient had a PCN reaction causing severe rash involving mucus membranes or skin necrosis: No Has patient had a PCN reaction that required hospitalization: No Has patient had a PCN reaction occurring within the last 10 years:yes If all of the above answers are "NO", then may proceed  with Cephalosporin use.  Marland Kitchen Hydrocodone Rash  . Percocet [Oxycodone-Acetaminophen] Hives and Rash    Tolerates dilaudid     Procedures/Studies: 2 D ECHO - Left ventricle: The cavity size was moderately dilated. Wall   thickness was normal. Systolic function was moderately to   severely reduced. The estimated ejection fraction was in the   range of 30% to 35%. Diffuse hypokinesis. Doppler parameters are   consistent with abnormal left ventricular relaxation (grade 1   diastolic dysfunction).  Ct Angio Chest Pe W Or Wo Contrast  Result Date: 09/20/2017 CLINICAL DATA:  Sudden onset dyspnea and hypoxia. EXAM: CT ANGIOGRAPHY CHEST WITH CONTRAST TECHNIQUE: Multidetector CT imaging of the chest was performed using the standard protocol during bolus administration of intravenous contrast. Multiplanar CT image reconstructions and MIPs were obtained to evaluate the vascular anatomy. CONTRAST:  141mL ISOVUE-370 IOPAMIDOL (ISOVUE-370) INJECTION 76% COMPARISON:  09/20/2017 CXR, chest CT 06/04/2017 FINDINGS: The study is limited by patient body habitus and respiratory motion artifacts. Cardiovascular: Cardiomegaly without pericardial effusion or significant pericardial thickening. No large central pulmonary embolus. Peripheral pulmonary arteries are limited in assessment due to patient body habitus and motion. No aortic aneurysm or dissection. Common arterial trunk of the right brachiocephalic and left  common carotid arteries. Mediastinum/Nodes: No mediastinal adenopathy. Patent trachea and mainstem bronchi. Esophagus is unremarkable. No thyromegaly or mass. Lungs/Pleura: Patchy pulmonary opacities in the right upper, middle and both lower lobes associated trace left and small right pleural effusions. Otherwise there are scattered ground-glass opacities throughout all lobes. Musculoskeletal: No chest wall abnormality. No acute or significant osseous findings. Review of the MIP images confirms the above findings.  IMPRESSION: 1. Cardiomegaly with patchy consolidations in the right upper, middle and both lower lobes associated with small right and trace left pleural effusions. Findings raise concern for multilobar pneumonia, stigmata of CHF or possibly combination thereof. 2. No acute pulmonary embolus given limitations due to body habitus and respiratory motion. Electronically Signed   By: Ashley Royalty M.D.   On: 09/20/2017 21:02   Dg Chest Port 1 View  Result Date: 09/20/2017 CLINICAL DATA:  Respiratory distress EXAM: PORTABLE CHEST 1 VIEW COMPARISON:  06/05/2017 FINDINGS: Stable cardiomegaly is noted. Diffuse bilateral fluffy infiltrates are noted likely related to pulmonary edema. No sizable effusion is seen. No pneumothorax or bony abnormality is noted. IMPRESSION: Bilateral fluffy infiltrates likely related to pulmonary edema. Electronically Signed   By: Inez Catalina M.D.   On: 09/20/2017 15:43     The results of significant diagnostics from this hospitalization (including imaging, microbiology, ancillary and laboratory) are listed below for reference.     Microbiology: Recent Results (from the past 240 hour(s))  MRSA PCR Screening     Status: None   Collection Time: 09/21/17  6:50 PM  Result Value Ref Range Status   MRSA by PCR NEGATIVE NEGATIVE Final    Comment:        The GeneXpert MRSA Assay (FDA approved for NASAL specimens only), is one component of a comprehensive MRSA colonization surveillance program. It is not intended to diagnose MRSA infection nor to guide or monitor treatment for MRSA infections. Performed at [facility], [place] [address] [place], [place] [address]      Labs: BNP (last 3 results) Recent Labs    04/07/17 1500 05/28/17 1059 09/20/17 1521  BNP 70.1 205.0* 329.9*   Basic Metabolic Panel: Recent Labs  Lab 09/20/17 1521 09/20/17 1527 09/20/17 1804 09/21/17 0346 09/22/17 0355 09/23/17 0247  NA 141 141  --  139 139 137  K 3.7 3.6  --  4.2 4.2 3.6   CL 105 104  --  104 104 102  CO2 20*  --   --  22 24 24   GLUCOSE 217* 201*  --  117* 159* 167*  BUN 12 13  --  16 19 16   CREATININE 1.23* 1.00 1.05* 1.18* 1.11* 0.97  CALCIUM 8.6*  --   --  8.7* 8.4* 8.2*   Liver Function Tests: Recent Labs  Lab 09/20/17 1521  AST 27  ALT 8*  ALKPHOS 90  BILITOT 0.4  PROT 6.6  ALBUMIN 2.6*   No results for input(s): LIPASE, AMYLASE in the last 168 hours. No results for input(s): AMMONIA in the last 168 hours. CBC: Recent Labs  Lab 09/20/17 1521 09/20/17 1527 09/20/17 1804 09/21/17 0346  WBC 14.7*  --  18.5* 16.8*  NEUTROABS 8.6*  --   --   --   HGB 11.5* 13.6 11.6* 12.3  HCT 37.5 40.0 36.4 38.8  MCV 90.6  --  89.2 87.8  PLT 362  --  339 373   Cardiac Enzymes: No results for input(s): CKTOTAL, CKMB, CKMBINDEX, TROPONINI in the last 168 hours. BNP: Invalid  input(s): POCBNP CBG: Recent Labs  Lab 09/22/17 0821 09/22/17 1159 09/22/17 1632 09/22/17 2139 09/23/17 0823  GLUCAP 123* 227* 211* 204* 168*   D-Dimer Recent Labs    09/20/17 1933  DDIMER 5.29*   Hgb A1c No results for input(s): HGBA1C in the last 72 hours. Lipid Profile No results for input(s): CHOL, HDL, LDLCALC, TRIG, CHOLHDL, LDLDIRECT in the last 72 hours. Thyroid function studies No results for input(s): TSH, T4TOTAL, T3FREE, THYROIDAB in the last 72 hours.  Invalid input(s): FREET3 Anemia work up No results for input(s): VITAMINB12, FOLATE, FERRITIN, TIBC, IRON, RETICCTPCT in the last 72 hours. Urinalysis    Component Value Date/Time   COLORURINE YELLOW 05/28/2017 1229   APPEARANCEUR CLEAR 05/28/2017 1229   LABSPEC 1.026 05/28/2017 1229   PHURINE 6.0 05/28/2017 1229   GLUCOSEU >=500 (A) 05/28/2017 1229   HGBUR MODERATE (A) 05/28/2017 1229   BILIRUBINUR NEGATIVE 05/28/2017 1229   KETONESUR NEGATIVE 05/28/2017 1229   PROTEINUR NEGATIVE 05/28/2017 1229   UROBILINOGEN 0.2 02/27/2015 1733   NITRITE NEGATIVE 05/28/2017 1229   LEUKOCYTESUR MODERATE (A)  05/28/2017 1229   Sepsis Labs Invalid input(s): PROCALCITONIN,  WBC,  LACTICIDVEN Microbiology Recent Results (from the past 240 hour(s))  MRSA PCR Screening     Status: None   Collection Time: 09/21/17  6:50 PM  Result Value Ref Range Status   MRSA by PCR NEGATIVE NEGATIVE Final    Comment:        The GeneXpert MRSA Assay (FDA approved for NASAL specimens only), is one component of a comprehensive MRSA colonization surveillance program. It is not intended to diagnose MRSA infection nor to guide or monitor treatment for MRSA infections. Performed at [facility], [place] [address], [place], [place] [address]      Time coordinating discharge: Over 30 minutes  SIGNED:   Debbe Odea, MD  [facility] 09/23/2017, 11:31 AM Pager   If 7PM-7AM, please contact night-coverage [email or web address] Password [email or web address]

## 2017-09-23 NOTE — Progress Notes (Signed)
Reviewed discharge instructions with pt including new medications , follow up appts and s/s of CHF. Pt verbalizes understanding.

## 2017-09-23 NOTE — Discharge Instructions (Signed)
Please remember to weigh yourself every day. Limit the intake of liquids to prevent heart failure.  You should try to drink less than 1200cc of fluid per day.  Your Demadex dose is being increased from 20 mg twice a day to 40 mg in the AM and 20 mg in the PM Continue to weigh yourself daily and wright it down- take the weights to your doctor next week.  You will need to see your PCP or your cardiologist next week to ensure that this is the right dose for you.   Continue to try to lose weight. Your minimal goal should be to lose 100 lbs this year.   Please take all your medications with you for your next visit with your Primary MD. Please request your Primary MD to go over all hospital test results at the follow up. Please ask your Primary MD to get all Hospital records sent to his/her office.  If you experience worsening of your admission symptoms, develop shortness of breath, chest pain, suicidal or homicidal thoughts or a life threatening emergency, you must seek medical attention immediately by calling 911 or calling your MD.  Dennis Bast must read the complete instructions/literature along with all the possible adverse reactions/side effects for all the medicines you take including new medications that have been prescribed to you. Take new medicines after you have completely understood and accpet all the possible adverse reactions/side effects.   Do not drive when taking pain medications or sedatives.    Do not take more than prescribed Pain, Sleep and Anxiety Medications  If you have smoked or chewed Tobacco in the last 2 yrs please stop. Stop any regular alcohol and or recreational drug use.  Wear Seat belts while driving.

## 2017-09-28 ENCOUNTER — Ambulatory Visit (INDEPENDENT_AMBULATORY_CARE_PROVIDER_SITE_OTHER): Payer: Medicaid Other | Admitting: Orthopaedic Surgery

## 2017-10-05 ENCOUNTER — Telehealth: Payer: Self-pay | Admitting: Endocrinology

## 2017-10-05 ENCOUNTER — Other Ambulatory Visit: Payer: Self-pay

## 2017-10-05 MED ORDER — GLUCOSE BLOOD VI STRP: ORAL_STRIP | 12 refills | Status: DC

## 2017-10-05 NOTE — Telephone Encounter (Signed)
Blood Glucose Monitoring Suppl (ACCU-CHEK AVIVA) device  Patient states insurance will only pay once every two years for this, She would like to know if we have a meter she can use until insurance will pay for a new one,   Please advise

## 2017-10-05 NOTE — Telephone Encounter (Signed)
I called patient & stated that I would put a new Accu-Chek Guide meter at front desk for her to pick up. I have also sent in prescription for Guide test strips.

## 2017-10-06 ENCOUNTER — Telehealth: Payer: Self-pay | Admitting: Endocrinology

## 2017-10-06 ENCOUNTER — Other Ambulatory Visit: Payer: Self-pay

## 2017-10-06 MED ORDER — GLUCOSE BLOOD VI STRP: ORAL_STRIP | 12 refills | Status: DC

## 2017-10-06 NOTE — Telephone Encounter (Signed)
Pharmacy would like someone to call and state that they need someone from our office to call and let them know that the  glucose blood (ACCU-CHEK AVIVA PLUS) test strip were sent by mistake and that they should be filling the   glucose blood (ACCU-CHEK GUIDE) test strip    [id number] [address] [place], [place].  [phone]

## 2017-10-06 NOTE — Telephone Encounter (Signed)
I have resent to pharmacy & put in note to pharmacy that strips should be filled for Guide meter.

## 2017-10-09 ENCOUNTER — Telehealth (INDEPENDENT_AMBULATORY_CARE_PROVIDER_SITE_OTHER): Payer: Self-pay

## 2017-10-09 ENCOUNTER — Ambulatory Visit (HOSPITAL_COMMUNITY)
Admission: RE | Admit: 2017-10-09 | Discharge: 2017-10-09 | Disposition: A | Discharge status: Home / Self Care | Payer: Medicaid Other | Source: Ambulatory Visit | Attending: Physician Assistant | Admitting: Physician Assistant

## 2017-10-09 ENCOUNTER — Ambulatory Visit (INDEPENDENT_AMBULATORY_CARE_PROVIDER_SITE_OTHER): Payer: Medicaid Other | Admitting: Physician Assistant

## 2017-10-09 ENCOUNTER — Encounter (INDEPENDENT_AMBULATORY_CARE_PROVIDER_SITE_OTHER): Payer: Self-pay | Admitting: Physician Assistant

## 2017-10-09 ENCOUNTER — Ambulatory Visit (INDEPENDENT_AMBULATORY_CARE_PROVIDER_SITE_OTHER): Payer: Medicaid Other | Admitting: Orthopaedic Surgery

## 2017-10-09 VITALS — Ht 68.0 in | Wt >= 6400 oz

## 2017-10-09 DIAGNOSIS — M25561 Pain in right knee: Secondary | ICD-10-CM

## 2017-10-09 DIAGNOSIS — M79661 Pain in right lower leg: Secondary | ICD-10-CM | POA: Diagnosis not present

## 2017-10-09 DIAGNOSIS — G8929 Other chronic pain: Secondary | ICD-10-CM

## 2017-10-09 NOTE — Telephone Encounter (Signed)
Patient is Negative for DVT, Right Leg

## 2017-10-09 NOTE — Progress Notes (Signed)
Preliminary results by tech - Right Lower Ext. Venous Duplex Completed. Negative for deep vein thrombosis and Baker's Cyst. Results given to April. Oda Cogan, BS, RDMS, RVT

## 2017-10-09 NOTE — Progress Notes (Signed)
Office Visit Note   Patient: [name]           Date of Birth: 1970/09/08           MRN: [id number] Visit Date: 10/09/2017              Requested by: Bartholome Bill, [address], [place] [address] PCP: Bartholome Bill, MD   Assessment & Plan: Visit Diagnoses:  1. Chronic pain of right knee   2. Pain of right calf     Plan: We will send her for a hinged brace right knee at [employer].  Have her go up to Voltaren gel up to 4 g 4 times daily.  Regards to the calf pain and tightness in the calf we will send her for an ultrasound to rule out DVT.  Discussed with her and had her demonstrate quad strengthening exercises.  Discussed weight loss with her but she is working on.   Follow-Up Instructions: Return in about 6 weeks (around 11/20/2017).   Orders:  No orders of the defined types were placed in this encounter.  No orders of the defined types were placed in this encounter.     Procedures: No procedures performed   Clinical Data: No additional findings.   Subjective: Chief Complaint  Patient presents with  . Right Knee - Pain, Follow-up    HPI  [name] comes in today for follow-up of her right knee status post injection 3 months ago.  She states the cortisone injection right knee only helped for a few days.  She has been hospitalized since she was last seen due to acute heart failure, acute respiratory failure and community-acquired pneumonia.  States for the last few days she has had severe calf pain that does not go away.  She notes some swelling in her right calf compared to the left.  She has had no new injury to the right knee.  Review of Systems Denies fevers chills chest pain radicular pain down either leg.  She has chronic shortness of breath.  Positive for dry cough.  Otherwise negative see HPI  Objective: Vital Signs: Ht _0  (1.727 m)   Wt (!) 405 lb (183.7 kg)   LMP 03/24/2017   BMI 61.58 kg/m   Physical Exam   Constitutional: She is oriented to person, place, and time. She appears well-developed and well-nourished. No distress.  Pulmonary/Chest: Effort normal.  Neurological: She is alert and oriented to person, place, and time.  Skin: She is not diaphoretic.  Psychiatric: She has a normal mood and affect.    Ortho Exam Right knee good range of motion without pain.  Slight tenderness over the medial joint line.  No instability.  Right calf is edematous and tender.  Positive Homans on the right negative on the left. Specialty Comments:  No specialty comments available.  Imaging: No results found.   PMFS History: Patient Active Problem List   Diagnosis Date Noted  . Multifocal pneumonia 09/23/2017  . Acute respiratory failure ([place]) 09/21/2017  . CHF exacerbation ([place]) 09/20/2017  . Costochondritis 06/06/2017  . Chest pain 06/04/2017  . Diabetes ([place]) 09/19/2015  . Radiculopathy of cervical spine   . Severe headache 03/05/2014  . Family history of malignant neoplasm of breast   . Family history of malignant neoplasm of ovary   . Intractable nausea and vomiting 11/11/2013  . Septate uterus 09/08/2013  . Diabetic gastroparesis ([place]) 04/28/2013  . Constipation due to  pain medication 04/21/2013  . Odynophagia 04/04/2013  . Liver mass 02/28/2013  . Reflux esophagitis 02/26/2013  . Morbid obesity ([place]) 10/10/2012  . Chronic diastolic CHF (congestive heart failure) ([place]) 10/09/2012  . Chronic respiratory failure with hypoxia ([place]) 10/09/2012  . Clinical polymyositis syndrome ([place]) 09/06/2012  . Mononeuritis of lower limb 01/12/2012  . HTN (hypertension) 11/08/2011  . Systolic CHF, acute on chronic ([place]) 11/08/2011  . HLD (hyperlipidemia) 07/19/2011  . Bell's palsy 08/09/2010  . SLE 08/09/2010  . [place] SYNDROME 08/09/2010   Past Medical History:  Diagnosis Date  . Acute renal failure (HCC)     Intractable nausea vomiting secondary to diabetic gastroparesis causing dehydration  and acute renal failure /notes 04/01/2013  . Anginal pain ([place])   . Anxiety   . Bell's palsy 08/09/2010  . CAP (community acquired pneumonia) 10/2011   Archie Endo 11/08/2011  . Chest pain 01/13/2014  . CHF (congestive heart failure) ([place])   . Diabetic gastroparesis associated with type 2 diabetes mellitus ([place])    this is presumed diagnoses, not confirmed by any studies.   . Eczema   . Family history of malignant neoplasm of breast   . Family history of malignant neoplasm of ovary   . GERD (gastroesophageal reflux disease)   . High cholesterol   . Hypertension   . Liver mass 2014   biopsied 03/2013 at [facility], not malignant.  is to undergo radiologic ablation of the mass in sept/October 2014.   . Lupus   . Migraines    "maybe a couple times/yr" (04/01/2013)  . Obesity   . Obstructive sleep apnea on CPAP 2011  . On home oxygen therapy    "2L 24/7" (02/28/2015)  . SJOGREN'S SYNDROME 08/09/2010  . SLE (systemic lupus erythematosus) ([place])    Archie Endo 12/01/2010  . Stroke [facility]) 2010; 10/2012   "left side is still weak from it, never fully regained full strength; no additions from stroke 10/2012"    Family History  Problem Relation Age of Onset  . Diabetes Mother   . Asthma Mother   . Urticaria Mother   . Breast cancer Maternal Aunt 19       currently 80  . Stomach cancer Maternal Uncle        dx 65s; deceased  . Prostate cancer Maternal Uncle        deceased 37s  . Cancer Maternal Aunt        unk. primary  . Cancer Maternal Aunt        unk. primary  . Ovarian cancer Maternal Aunt        deceased 57s  . Hypertension Other   . Stroke Other   . Arthritis Other   . Ovarian cancer Sister 75       maternal half-sister; RAD51C positive  . Cancer Paternal Aunt        unk. primary; deceased 28s  . Prostate cancer Paternal Uncle        deceased 69s  . Breast cancer Cousin        deceased 45s; daughter of mat uncle with stomach ca  . Breast cancer Maternal Aunt        deceased 36s  .  Asthma Brother   . Asthma Brother   . Allergic rhinitis Neg Hx   . Angioedema Neg Hx     Past Surgical History:  Procedure Laterality Date  . BREAST CYST EXCISION Left 08/2005   epidermoid  . CARDIAC CATHETERIZATION  02/07/12  . ECTOPIC  PREGNANCY SURGERY  1999  . ESOPHAGOGASTRODUODENOSCOPY N/A 02/26/2013   Procedure: ESOPHAGOGASTRODUODENOSCOPY (EGD);  Surgeon: Irene Shipper, MD;  Location: [facility] ENDOSCOPY;  Service: Endoscopy;  Laterality: N/A;  . ESOPHAGOGASTRODUODENOSCOPY N/A 03/02/2015   Procedure: ESOPHAGOGASTRODUODENOSCOPY (EGD);  Surgeon: Milus Banister, MD;  Location: [place];  Service: Endoscopy;  Laterality: N/A;  . HERNIA REPAIR    . LEFT AND RIGHT HEART CATHETERIZATION WITH CORONARY ANGIOGRAM N/A 02/07/2012   Procedure: LEFT AND RIGHT HEART CATHETERIZATION WITH CORONARY ANGIOGRAM;  Surgeon: Laverda Page, MD;  Location: [facility] CATH LAB;  Service: Cardiovascular;  Laterality: N/A;  . LIVER BIOPSY  03/2013   liver mass/medical hx noted above  . MUSCLE BIOPSY     for lupus/notes 12/01/2010  . UMBILICAL HERNIA REPAIR  1980's   Social History   Occupational History    Employer: UNEMPLOYED    Comment: student  Tobacco Use  . Smoking status: Former Smoker    Years: 3.00    Types: Cigarettes    Last attempt to quit: 06/01/1990    Years since quitting: 27.3  . Smokeless tobacco: Never Used  Substance and Sexual Activity  . Alcohol use: No    Alcohol/week: 0.0 oz  . Drug use: No  . Sexual activity: Yes    Partners: Male    Birth control/protection: None

## 2017-10-13 ENCOUNTER — Telehealth: Payer: Self-pay

## 2017-10-13 MED ORDER — ACCU-CHEK MULTICLIX LANCETS MISC: 12 refills | Status: DC

## 2017-10-13 NOTE — Telephone Encounter (Signed)
Please verify NPH insulin is 270 units each morning Then please reduce to 240 units qam Please call or message [place] next week, to tell us how the blood sugar is doing

## 2017-10-13 NOTE — Telephone Encounter (Signed)
I spoke with patient & gave her new dosage. She stated that she would call next week to let us know if her blood sugars have gotten better.

## 2017-10-13 NOTE — Telephone Encounter (Signed)
Patient called concerning NPH insulin. She is having issues with blood sugars going low through out the day. She has seen numbers between 45-60, especially in the morning as well as mid-day. Please advise?

## 2017-11-03 ENCOUNTER — Other Ambulatory Visit (INDEPENDENT_AMBULATORY_CARE_PROVIDER_SITE_OTHER): Payer: Self-pay | Admitting: Orthopaedic Surgery

## 2017-11-03 NOTE — Telephone Encounter (Signed)
Called to pharmacy 

## 2017-11-03 NOTE — Telephone Encounter (Signed)
Please advise 

## 2017-11-09 ENCOUNTER — Encounter: Payer: Self-pay | Admitting: Endocrinology

## 2017-11-09 ENCOUNTER — Ambulatory Visit (INDEPENDENT_AMBULATORY_CARE_PROVIDER_SITE_OTHER): Payer: Medicaid Other | Admitting: Endocrinology

## 2017-11-09 VITALS — BP 105/69 | HR 86

## 2017-11-09 DIAGNOSIS — Z794 Long term (current) use of insulin: Secondary | ICD-10-CM

## 2017-11-09 DIAGNOSIS — E1143 Type 2 diabetes mellitus with diabetic autonomic (poly)neuropathy: Secondary | ICD-10-CM | POA: Diagnosis not present

## 2017-11-09 LAB — POCT GLYCOSYLATED HEMOGLOBIN (HGB A1C): Hemoglobin A1C: 6.9

## 2017-11-09 MED ORDER — INSULIN NPH (HUMAN) (ISOPHANE) 100 UNIT/ML ~~LOC~~ SUSP: 220.0000 [IU] | SUBCUTANEOUS | 11 refills | Status: DC

## 2017-11-09 NOTE — Patient Instructions (Addendum)
check your blood sugar 4 times a day: before the 3 meals, and at bedtime.  also check if you have symptoms of your blood sugar being too high or too low.  please keep a record of the readings and bring it to your next appointment here (or you can bring the meter itself).  You can write it on any piece of paper.  please call us sooner if your blood sugar goes below 70, or if you have a lot of readings over 200. Please continue the same [place], and: Reduce the NPH insulin to 220 units each morning.   On this type of insulin schedule, you should eat meals on a regular schedule (especially lunch).  If a meal is missed or significantly delayed, your blood sugar could go low. Please come back for a follow-up appointment in 3 months.

## 2017-11-09 NOTE — Progress Notes (Signed)
Subjective:    Patient ID: [name], female    DOB: 04/23/1971, 47 y.o.   MRN: [id number]  HPI Pt returns for f/u of diabetes mellitus: DM type: Insulin-requiring type 2 Dx'ed: 9528 Complications: polyneuropathy and gastroparesis.  Therapy: insulin (since 2014), and metformin.   GDM: never.  DKA: never Severe hypoglycemia: never.  Pancreatitis: never.  Other: she is too ill to undergo weight-loss surgery; she takes qd insulin, after poor results with multiple daily injections.   Interval history: she is tapering prednisone (still 3 mg qd)  no cbg record, but states cbg's vary from 89-120.  It is in general higher as the day goes on.  She says she never misses the insulin.   Past Medical History:  Diagnosis Date  . Acute renal failure (HCC)     Intractable nausea vomiting secondary to diabetic gastroparesis causing dehydration and acute renal failure /notes 04/01/2013  . Anginal pain ([place])   . Anxiety   . Bell's palsy 08/09/2010  . CAP (community acquired pneumonia) 10/2011   Archie Endo 11/08/2011  . Chest pain 01/13/2014  . CHF (congestive heart failure) ([place])   . Diabetic gastroparesis associated with type 2 diabetes mellitus ([place])    this is presumed diagnoses, not confirmed by any studies.   . Eczema   . Family history of malignant neoplasm of breast   . Family history of malignant neoplasm of ovary   . GERD (gastroesophageal reflux disease)   . High cholesterol   . Hypertension   . Liver mass 2014   biopsied 03/2013 at [facility], not malignant.  is to undergo radiologic ablation of the mass in sept/October 2014.   . Lupus ([place])   . Migraines    "maybe a couple times/yr" (04/01/2013)  . Obesity   . Obstructive sleep apnea on CPAP 2011  . On home oxygen therapy    "2L 24/7" (02/28/2015)  . SJOGREN'S SYNDROME 08/09/2010  . SLE (systemic lupus erythematosus) ([place])    Archie Endo 12/01/2010  . Stroke [facility]) 2010; 10/2012   "left side is still weak from it, never fully regained full  strength; no additions from stroke 10/2012"    Past Surgical History:  Procedure Laterality Date  . BREAST CYST EXCISION Left 08/2005   epidermoid  . CARDIAC CATHETERIZATION  02/07/12  . ECTOPIC PREGNANCY SURGERY  1999  . ESOPHAGOGASTRODUODENOSCOPY N/A 02/26/2013   Procedure: ESOPHAGOGASTRODUODENOSCOPY (EGD);  Surgeon: Irene Shipper, MD;  Location: [facility] ENDOSCOPY;  Service: Endoscopy;  Laterality: N/A;  . ESOPHAGOGASTRODUODENOSCOPY N/A 03/02/2015   Procedure: ESOPHAGOGASTRODUODENOSCOPY (EGD);  Surgeon: Milus Banister, MD;  Location: [place];  Service: Endoscopy;  Laterality: N/A;  . HERNIA REPAIR    . LEFT AND RIGHT HEART CATHETERIZATION WITH CORONARY ANGIOGRAM N/A 02/07/2012   Procedure: LEFT AND RIGHT HEART CATHETERIZATION WITH CORONARY ANGIOGRAM;  Surgeon: Laverda Page, MD;  Location: [facility] CATH LAB;  Service: Cardiovascular;  Laterality: N/A;  . LIVER BIOPSY  03/2013   liver mass/medical hx noted above  . MUSCLE BIOPSY     for lupus/notes 12/01/2010  . UMBILICAL HERNIA REPAIR  1980's    Social History   Socioeconomic History  . Marital status: Single    Spouse name: Not on file  . Number of children: Not on file  . Years of education: Not on file  . Highest education level: Not on file  Occupational History    Employer: UNEMPLOYED    Comment: student  Social Needs  . Financial resource strain: Not  on file  . Food insecurity:    Worry: Not on file    Inability: Not on file  . Transportation needs:    Medical: Not on file    Non-medical: Not on file  Tobacco Use  . Smoking status: Former Smoker    Years: 3.00    Types: Cigarettes    Last attempt to quit: 06/01/1990    Years since quitting: 27.4  . Smokeless tobacco: Never Used  Substance and Sexual Activity  . Alcohol use: No    Alcohol/week: 0.0 oz  . Drug use: No  . Sexual activity: Yes    Partners: Male    Birth control/protection: None  Lifestyle  . Physical activity:    Days per week: Not on file    Minutes  per session: Not on file  . Stress: Not on file  Relationships  . Social connections:    Talks on phone: Not on file    Gets together: Not on file    Attends religious service: Not on file    Active member of club or organization: Not on file    Attends meetings of clubs or organizations: Not on file    Relationship status: Not on file  . Intimate partner violence:    Fear of current or ex partner: Not on file    Emotionally abused: Not on file    Physically abused: Not on file    Forced sexual activity: Not on file  Other Topics Concern  . Not on file  Social History Narrative   Regular exercise-yes    Current Outpatient Medications on File Prior to Visit  Medication Sig Dispense Refill  . ACCU-CHEK SOFTCLIX LANCETS lancets USE TO TEST BLOOD SUGAR 3 TIMES A DAY 300 each 0  . albuterol (PROAIR HFA) 108 (90 BASE) MCG/ACT inhaler Inhale 2 puffs into the lungs every 6 (six) hours as needed for wheezing or shortness of breath.     Marland Kitchen albuterol (PROVENTIL) (2.5 MG/3ML) 0.083% nebulizer solution Take 2.5 mg by nebulization every 6 (six) hours as needed for wheezing.    Marland Kitchen alendronate (FOSAMAX) 70 MG tablet Take 70 mg by mouth every Saturday. Take with a full glass of water on an empty stomach.    . ALPRAZolam (XANAX) 0.5 MG tablet Take 0.5 mg 2 (two) times daily as needed by mouth for anxiety or sleep.     Marland Kitchen aspirin EC 81 MG tablet Take 81 mg by mouth daily.    Marland Kitchen azaTHIOprine (IMURAN) 50 MG tablet Take 100 mg by mouth 2 (two) times daily.     . Blood Glucose Monitoring Suppl (ACCU-CHEK AVIVA) device Use as instructed 1 each 0  . buPROPion (WELLBUTRIN) 100 MG tablet Take 100 mg by mouth every morning.    . carvedilol (COREG) 12.5 MG tablet Take 3 tablets (37.5 mg total) 2 (two) times daily with a meal by mouth. 180 tablet 0  . cetirizine (ZYRTEC) 10 MG tablet Take 10 mg by mouth daily.    . clopidogrel (PLAVIX) 75 MG tablet Take 75 mg by mouth daily.    . clotrimazole-betamethasone  (LOTRISONE) cream Apply 1 application daily as needed topically (itching/breakouts on feet).     . cyclobenzaprine (FLEXERIL) 10 MG tablet Take 10 mg 2 (two) times daily as needed by mouth.     . cycloSPORINE (RESTASIS) 0.05 % ophthalmic emulsion Place 1 drop into both eyes 3 (three) times daily.    . diclofenac sodium (VOLTAREN) 1 % GEL Apply 2-4  g 4 (four) times daily as needed topically (joint pain).    Marland Kitchen doxepin (SINEQUAN) 50 MG capsule Take 50-100 mg by mouth at bedtime as needed (for insomnia).     Marland Kitchen etonogestrel-ethinyl estradiol (NUVARING) 0.12-0.015 MG/24HR vaginal ring Place 1 each every 28 (twenty-eight) days vaginally. Insert vaginally and leave in place for 3 consecutive weeks, then remove for 1 week.    Marland Kitchen exenatide (BYETTA) 10 MCG/0.04ML SOPN injection Inject 0.04 mLs (10 mcg total) into the skin 2 (two) times daily with a meal. And pen needles 2/day (Patient taking differently: Inject 10 mcg into the skin 2 (two) times daily with a meal. ) 2.4 mL 11  . famotidine (PEPCID) 20 MG tablet Take 20 mg by mouth 2 (two) times daily.    Marland Kitchen glucose blood (ACCU-CHEK AVIVA PLUS) test strip Used to check blood sugar 5x daily. 450 each 2  . glucose blood (ACCU-CHEK GUIDE) test strip Used to check blood sugars 5x daily. 200 each 12  . hydrALAZINE (APRESOLINE) 50 MG tablet Take 50 mg by mouth 3 (three) times daily.    . hydrochlorothiazide (HYDRODIURIL) 25 MG tablet Take 25 mg by mouth daily.    . hydroxychloroquine (PLAQUENIL) 200 MG tablet Take 200 mg by mouth 2 (two) times daily.     . Insulin Pen Needle 32G X 4 MM MISC Used to inject insulin 1x daily. 100 each 5  . Insulin Syringe-Needle U-100 (COMFORT ASSIST INSULIN SYRINGE) 31G X 5/16" 1 ML MISC Use one daily with insulin 90 each 4  . Lancets (ACCU-CHEK MULTICLIX) lancets Used to check blood sugars daily up to 5 times. 204 each 12  . lidocaine (LIDODERM) 5 % Place 1 patch onto the skin daily. Remove & Discard patch within 12 hours or as directed  by MD (Patient not taking: Reported on 09/21/2017) 30 patch 0  . magnesium oxide (MAG-OX) 400 MG tablet Take 400 mg by mouth daily.    . megestrol (MEGACE) 40 MG tablet Take 40 mg by mouth daily.  3  . metFORMIN (GLUCOPHAGE-XR) 500 MG 24 hr tablet Take 1,000 mg by mouth 2 (two) times daily.    . mometasone-formoterol (DULERA) 100-5 MCG/ACT AERO Inhale 2 puffs into the lungs 2 (two) times daily.    Marland Kitchen morphine (MSIR) 15 MG tablet Take 1 tablet (15 mg total) every 6 (six) hours as needed by mouth for severe pain. (Patient not taking: Reported on 09/21/2017) 6 tablet 0  . olmesartan (BENICAR) 40 MG tablet Take 40 mg by mouth daily.    . Olopatadine HCl (PAZEO) 0.7 % SOLN Place 1 drop daily as needed into both eyes (itching/irritation). Pazeo    . oxybutynin (DITROPAN-XL) 10 MG 24 hr tablet Take 10 mg by mouth daily.    . Polyvinyl Alcohol-Povidone (REFRESH OP) Place 1 drop 2 (two) times daily into both eyes.     . potassium chloride SA (K-DUR,KLOR-CON) 20 MEQ tablet Take 20 mEq by mouth 2 (two) times daily.    . pravastatin (PRAVACHOL) 10 MG tablet Take 10 mg by mouth daily.    . predniSONE (DELTASONE) 1 MG tablet Take 3 mg by mouth daily.     . pregabalin (LYRICA) 75 MG capsule Take 75 mg 3 (three) times daily by mouth.     . topiramate (TOPAMAX) 25 MG tablet Take 75 mg by mouth at bedtime.    . torsemide (DEMADEX) 20 MG tablet 40 mg in AM and 20 mg in PM 30 tablet 0  .  traMADol (ULTRAM) 50 MG tablet TAKE 1 TO 2 TABLETS BY MOUTH EVERY 6 HOURS AS NEEDED FOR PAIN**PA REQ** 60 tablet 0   No current facility-administered medications on file prior to visit.     Allergies  Allergen Reactions  . Metoclopramide Other (See Comments)    Developed restless leg, akathisia type limb movements.   . Codeine Itching, Rash and Other (See Comments)  . Penicillins Itching    Has patient had a PCN reaction causing immediate rash, facial/tongue/throat swelling, SOB or lightheadedness with hypotension: No Has  patient had a PCN reaction causing severe rash involving mucus membranes or skin necrosis: No Has patient had a PCN reaction that required hospitalization: No Has patient had a PCN reaction occurring within the last 10 years:yes If all of the above answers are "NO", then may proceed with Cephalosporin use.  Marland Kitchen Hydrocodone Rash  . Percocet [Oxycodone-Acetaminophen] Hives and Rash    Tolerates dilaudid    Family History  Problem Relation Age of Onset  . Diabetes Mother   . Asthma Mother   . Urticaria Mother   . Breast cancer Maternal Aunt 47       currently 29  . Stomach cancer Maternal Uncle        dx 41s; deceased  . Prostate cancer Maternal Uncle        deceased 34s  . Cancer Maternal Aunt        unk. primary  . Cancer Maternal Aunt        unk. primary  . Ovarian cancer Maternal Aunt        deceased 13s  . Hypertension Other   . Stroke Other   . Arthritis Other   . Ovarian cancer Sister 82       maternal half-sister; RAD51C positive  . Cancer Paternal Aunt        unk. primary; deceased 69s  . Prostate cancer Paternal Uncle        deceased 57s  . Breast cancer Cousin        deceased 69s; daughter of mat uncle with stomach ca  . Breast cancer Maternal Aunt        deceased 32s  . Asthma Brother   . Asthma Brother   . Allergic rhinitis Neg Hx   . Angioedema Neg Hx     BP 105/69 (BP Location: Left Arm, Patient Position: Sitting, Cuff Size: Normal)   Pulse 86   LMP 03/24/2017   SpO2 97%    Review of Systems She denies hypoglycemia    Objective:   Physical Exam VITAL SIGNS:  See vs page GENERAL: no distress Pulses: foot pulses are intact bilaterally.   MSK: no deformity of the feet or ankles.  CV: no edema of the legs or ankles Skin:  no ulcer on the feet or ankles.  normal color and temp on the feet and ankles Neuro: sensation is intact to touch on the feet and ankles.     Lab Results  Component Value Date   HGBA1C 6.9 11/09/2017      Assessment & Plan:   Insulin-requiring type 2 DM: overcontrolled, given this regimen, which does match insulin to her changing needs throughout the day.    Patient Instructions  check your blood sugar 4 times a day: before the 3 meals, and at bedtime.  also check if you have symptoms of your blood sugar being too high or too low.  please keep a record of the readings and bring it to your  next appointment here (or you can bring the meter itself).  You can write it on any piece of paper.  please call us sooner if your blood sugar goes below 70, or if you have a lot of readings over 200. Please continue the same [place], and: Reduce the NPH insulin to 220 units each morning.   On this type of insulin schedule, you should eat meals on a regular schedule (especially lunch).  If a meal is missed or significantly delayed, your blood sugar could go low. Please come back for a follow-up appointment in 3 months.

## 2017-11-15 ENCOUNTER — Other Ambulatory Visit: Payer: Self-pay | Admitting: *Deleted

## 2017-11-15 MED ORDER — INSULIN NPH (HUMAN) (ISOPHANE) 100 UNIT/ML ~~LOC~~ SUSP: SUBCUTANEOUS | 11 refills | Status: DC

## 2017-11-15 NOTE — Telephone Encounter (Signed)
Rec PA request for Novolin N. New Rx for Humulin sent to replace Novolin N. See meds.

## 2017-11-16 ENCOUNTER — Other Ambulatory Visit: Payer: Self-pay

## 2017-11-27 ENCOUNTER — Ambulatory Visit (INDEPENDENT_AMBULATORY_CARE_PROVIDER_SITE_OTHER): Payer: Medicaid Other | Admitting: Physician Assistant

## 2017-11-29 ENCOUNTER — Other Ambulatory Visit: Payer: Self-pay | Admitting: Endocrinology

## 2017-12-06 ENCOUNTER — Telehealth (HOSPITAL_COMMUNITY): Payer: Self-pay

## 2017-12-06 NOTE — Telephone Encounter (Signed)
Patients insurance is active through [facility] - reference [id number]  Will fax over Medicaid Reimbursement form to [place].  Once fax has been received and signed by MD, patient will be contacted for scheduling.

## 2017-12-11 ENCOUNTER — Telehealth (HOSPITAL_COMMUNITY): Payer: Self-pay

## 2017-12-11 NOTE — Telephone Encounter (Signed)
Called patient to see if she is interested in the Cardiac Rehab Program. Patient stated she is interested. Scheduled orientation on 02/08/18 at 8:30am. Patient will attend the 11:15am exc class. Mailed packet.

## 2018-01-29 ENCOUNTER — Telehealth (HOSPITAL_COMMUNITY): Payer: Self-pay | Admitting: Pharmacist

## 2018-01-29 NOTE — Telephone Encounter (Signed)
Cardiac Rehab Medication Review by a Pharmacist  Does the patient  feel that his/her medications are working for him/her?  yes  Has the patient been experiencing any side effects to the medications prescribed?  no  Does the patient measure his/her own blood pressure or blood glucose at home?  yes   Does the patient have any problems obtaining medications due to transportation or finances?   no  Understanding of regimen: good Understanding of indications: good Potential of compliance: good    Pharmacist comments: Patient states compliance with medications and states no problems with obtaining medications.     [name] 01/29/2018 11:57 AM

## 2018-02-07 ENCOUNTER — Encounter (HOSPITAL_COMMUNITY): Payer: Self-pay

## 2018-02-08 ENCOUNTER — Encounter (HOSPITAL_COMMUNITY)
Admission: RE | Admit: 2018-02-08 | Discharge: 2018-02-08 | Disposition: A | Discharge status: Home / Self Care | Payer: Medicaid Other | Source: Ambulatory Visit | Attending: Cardiology | Admitting: Cardiology

## 2018-02-08 VITALS — Ht 68.75 in | Wt 396.4 lb

## 2018-02-08 DIAGNOSIS — Z79818 Long term (current) use of other agents affecting estrogen receptors and estrogen levels: Secondary | ICD-10-CM | POA: Diagnosis not present

## 2018-02-08 DIAGNOSIS — Z8041 Family history of malignant neoplasm of ovary: Secondary | ICD-10-CM | POA: Insufficient documentation

## 2018-02-08 DIAGNOSIS — Z7951 Long term (current) use of inhaled steroids: Secondary | ICD-10-CM | POA: Insufficient documentation

## 2018-02-08 DIAGNOSIS — K3184 Gastroparesis: Secondary | ICD-10-CM | POA: Insufficient documentation

## 2018-02-08 DIAGNOSIS — Z7952 Long term (current) use of systemic steroids: Secondary | ICD-10-CM | POA: Insufficient documentation

## 2018-02-08 DIAGNOSIS — Z791 Long term (current) use of non-steroidal anti-inflammatories (NSAID): Secondary | ICD-10-CM | POA: Insufficient documentation

## 2018-02-08 DIAGNOSIS — E7800 Pure hypercholesterolemia, unspecified: Secondary | ICD-10-CM | POA: Diagnosis not present

## 2018-02-08 DIAGNOSIS — Z79899 Other long term (current) drug therapy: Secondary | ICD-10-CM | POA: Insufficient documentation

## 2018-02-08 DIAGNOSIS — Z792 Long term (current) use of antibiotics: Secondary | ICD-10-CM | POA: Insufficient documentation

## 2018-02-08 DIAGNOSIS — Z79891 Long term (current) use of opiate analgesic: Secondary | ICD-10-CM | POA: Insufficient documentation

## 2018-02-08 DIAGNOSIS — Z7982 Long term (current) use of aspirin: Secondary | ICD-10-CM | POA: Diagnosis not present

## 2018-02-08 DIAGNOSIS — I110 Hypertensive heart disease with heart failure: Secondary | ICD-10-CM | POA: Diagnosis present

## 2018-02-08 DIAGNOSIS — Z794 Long term (current) use of insulin: Secondary | ICD-10-CM | POA: Insufficient documentation

## 2018-02-08 DIAGNOSIS — M329 Systemic lupus erythematosus, unspecified: Secondary | ICD-10-CM | POA: Diagnosis not present

## 2018-02-08 DIAGNOSIS — N179 Acute kidney failure, unspecified: Secondary | ICD-10-CM | POA: Insufficient documentation

## 2018-02-08 DIAGNOSIS — G4733 Obstructive sleep apnea (adult) (pediatric): Secondary | ICD-10-CM | POA: Diagnosis not present

## 2018-02-08 DIAGNOSIS — I5022 Chronic systolic (congestive) heart failure: Secondary | ICD-10-CM | POA: Insufficient documentation

## 2018-02-08 DIAGNOSIS — Z9981 Dependence on supplemental oxygen: Secondary | ICD-10-CM | POA: Diagnosis not present

## 2018-02-08 DIAGNOSIS — Z7902 Long term (current) use of antithrombotics/antiplatelets: Secondary | ICD-10-CM | POA: Insufficient documentation

## 2018-02-08 DIAGNOSIS — E1143 Type 2 diabetes mellitus with diabetic autonomic (poly)neuropathy: Secondary | ICD-10-CM | POA: Diagnosis not present

## 2018-02-08 DIAGNOSIS — Z803 Family history of malignant neoplasm of breast: Secondary | ICD-10-CM | POA: Insufficient documentation

## 2018-02-08 DIAGNOSIS — Z7901 Long term (current) use of anticoagulants: Secondary | ICD-10-CM | POA: Diagnosis not present

## 2018-02-08 DIAGNOSIS — F419 Anxiety disorder, unspecified: Secondary | ICD-10-CM | POA: Insufficient documentation

## 2018-02-08 DIAGNOSIS — K219 Gastro-esophageal reflux disease without esophagitis: Secondary | ICD-10-CM | POA: Insufficient documentation

## 2018-02-08 DIAGNOSIS — Z7983 Long term (current) use of bisphosphonates: Secondary | ICD-10-CM | POA: Insufficient documentation

## 2018-02-08 NOTE — Progress Notes (Signed)
Cardiac Individual Treatment Plan  Patient Details  Name: [name] MRN: [id number] Date of Birth: 02/25/1971 Referring Provider:     CARDIAC REHAB PHASE II ORIENTATION from 02/08/2018 in [facility]  Referring Provider  Adrian Prows MD       Initial Encounter Date:    CARDIAC REHAB PHASE II ORIENTATION from 02/08/2018 in [place]  Date  02/08/18      Visit Diagnosis: Chronic systolic congestive heart failure ([place])  Patient's Home Medications on Admission:  Current Outpatient Medications:  .  ACCU-CHEK AVIVA PLUS test strip, USE TO CHECK BLOOD SUGAR THREE TIMES A DAY, Disp: 300 each, Rfl: 0 .  ACCU-CHEK SOFTCLIX LANCETS lancets, USE TO TEST BLOOD SUGAR 3 TIMES A DAY, Disp: 300 each, Rfl: 0 .  albuterol (PROAIR HFA) 108 (90 BASE) MCG/ACT inhaler, Inhale 2 puffs into the lungs every 6 (six) hours as needed for wheezing or shortness of breath. , Disp: , Rfl:  .  albuterol (PROVENTIL) (2.5 MG/3ML) 0.083% nebulizer solution, Take 2.5 mg by nebulization every 6 (six) hours as needed for wheezing., Disp: , Rfl:  .  alendronate (FOSAMAX) 70 MG tablet, Take 70 mg by mouth every Saturday. Take with a full glass of water on an empty stomach., Disp: , Rfl:  .  ALPRAZolam (XANAX) 0.5 MG tablet, Take 0.5 mg 2 (two) times daily as needed by mouth for anxiety or sleep. , Disp: , Rfl:  .  aspirin EC 81 MG tablet, Take 81 mg by mouth daily., Disp: , Rfl:  .  azaTHIOprine (IMURAN) 50 MG tablet, Take 100 mg by mouth 2 (two) times daily. , Disp: , Rfl:  .  Blood Glucose Monitoring Suppl (ACCU-CHEK AVIVA) device, Use as instructed, Disp: 1 each, Rfl: 0 .  buPROPion (WELLBUTRIN) 100 MG tablet, Take 100 mg by mouth every morning., Disp: , Rfl:  .  carvedilol (COREG) 12.5 MG tablet, Take 3 tablets (37.5 mg total) 2 (two) times daily with a meal by mouth., Disp: 180 tablet, Rfl: 0 .  cetirizine (ZYRTEC) 10 MG tablet, Take 10 mg by mouth daily as  needed for allergies. , Disp: , Rfl:  .  clopidogrel (PLAVIX) 75 MG tablet, Take 75 mg by mouth daily., Disp: , Rfl:  .  clotrimazole-betamethasone (LOTRISONE) cream, Apply 1 application daily as needed topically (itching/breakouts on feet). , Disp: , Rfl:  .  cyclobenzaprine (FLEXERIL) 10 MG tablet, Take 10 mg 2 (two) times daily as needed by mouth. , Disp: , Rfl:  .  cycloSPORINE (RESTASIS) 0.05 % ophthalmic emulsion, Place 1 drop into both eyes 3 (three) times daily., Disp: , Rfl:  .  diclofenac sodium (VOLTAREN) 1 % GEL, Apply 2-4 g 4 (four) times daily as needed topically (joint pain)., Disp: , Rfl:  .  doxepin (SINEQUAN) 50 MG capsule, Take 50-100 mg by mouth at bedtime as needed (for insomnia). , Disp: , Rfl:  .  etonogestrel-ethinyl estradiol (NUVARING) 0.12-0.015 MG/24HR vaginal ring, Place 1 each every 28 (twenty-eight) days vaginally. Insert vaginally and leave in place for 3 consecutive weeks, then remove for 1 week., Disp: , Rfl:  .  exenatide (BYETTA) 10 MCG/0.04ML SOPN injection, Inject 0.04 mLs (10 mcg total) into the skin 2 (two) times daily with a meal. And pen needles 2/day (Patient taking differently: Inject 10 mcg into the skin 2 (two) times daily with a meal. ), Disp: 2.4 mL, Rfl: 11 .  famotidine (PEPCID) 20 MG tablet,  Take 20 mg by mouth 2 (two) times daily., Disp: , Rfl:  .  glucose blood (ACCU-CHEK AVIVA PLUS) test strip, Used to check blood sugar 5x daily., Disp: 450 each, Rfl: 2 .  glucose blood (ACCU-CHEK GUIDE) test strip, Used to check blood sugars 5x daily., Disp: 200 each, Rfl: 12 .  hydrALAZINE (APRESOLINE) 50 MG tablet, Take 50 mg by mouth 3 (three) times daily., Disp: , Rfl:  .  hydrochlorothiazide (HYDRODIURIL) 25 MG tablet, Take 25 mg by mouth daily., Disp: , Rfl:  .  hydroxychloroquine (PLAQUENIL) 200 MG tablet, Take 200 mg by mouth 2 (two) times daily. , Disp: , Rfl:  .  insulin NPH Human (HUMULIN N) 100 UNIT/ML injection, Inject 2.2 mLs (220 Units total) into  the skin every morning. And syringes 3/day, Disp: 80 mL, Rfl: 11 .  Insulin Pen Needle 32G X 4 MM MISC, Used to inject insulin 1x daily., Disp: 100 each, Rfl: 5 .  Insulin Syringe-Needle U-100 (COMFORT ASSIST INSULIN SYRINGE) 31G X 5/16" 1 ML MISC, Use one daily with insulin, Disp: 90 each, Rfl: 4 .  Lancets (ACCU-CHEK MULTICLIX) lancets, Used to check blood sugars daily up to 5 times., Disp: 204 each, Rfl: 12 .  lidocaine (LIDODERM) 5 %, Place 1 patch onto the skin daily. Remove & Discard patch within 12 hours or as directed by MD (Patient not taking: Reported on 09/21/2017), Disp: 30 patch, Rfl: 0 .  magnesium oxide (MAG-OX) 400 MG tablet, Take 400 mg by mouth daily., Disp: , Rfl:  .  megestrol (MEGACE) 40 MG tablet, Take 40 mg by mouth daily., Disp: , Rfl: 3 .  metFORMIN (GLUCOPHAGE-XR) 500 MG 24 hr tablet, Take 1,000 mg by mouth 2 (two) times daily., Disp: , Rfl:  .  mometasone-formoterol (DULERA) 100-5 MCG/ACT AERO, Inhale 2 puffs into the lungs 2 (two) times daily., Disp: , Rfl:  .  morphine (MSIR) 15 MG tablet, Take 1 tablet (15 mg total) every 6 (six) hours as needed by mouth for severe pain. (Patient not taking: Reported on 09/21/2017), Disp: 6 tablet, Rfl: 0 .  olmesartan (BENICAR) 40 MG tablet, Take 40 mg by mouth daily., Disp: , Rfl:  .  Olopatadine HCl (PAZEO) 0.7 % SOLN, Place 1 drop daily as needed into both eyes (itching/irritation). Pazeo, Disp: , Rfl:  .  oxybutynin (DITROPAN-XL) 10 MG 24 hr tablet, Take 10 mg by mouth daily., Disp: , Rfl:  .  Polyvinyl Alcohol-Povidone (REFRESH OP), Place 1 drop 2 (two) times daily into both eyes. , Disp: , Rfl:  .  potassium chloride SA (K-DUR,KLOR-CON) 20 MEQ tablet, Take 20 mEq by mouth daily. , Disp: , Rfl:  .  pravastatin (PRAVACHOL) 10 MG tablet, Take 10 mg by mouth daily., Disp: , Rfl:  .  predniSONE (DELTASONE) 1 MG tablet, Take 3 mg by mouth daily. , Disp: , Rfl:  .  pregabalin (LYRICA) 75 MG capsule, Take 75 mg 3 (three) times daily by  mouth. , Disp: , Rfl:  .  topiramate (TOPAMAX) 25 MG tablet, Take 75 mg by mouth at bedtime., Disp: , Rfl:  .  torsemide (DEMADEX) 20 MG tablet, 40 mg in AM and 20 mg in PM (Patient taking differently: 20 mg daily. ), Disp: 30 tablet, Rfl: 0 .  traMADol (ULTRAM) 50 MG tablet, TAKE 1 TO 2 TABLETS BY MOUTH EVERY 6 HOURS AS NEEDED FOR PAIN**PA REQ**, Disp: 60 tablet, Rfl: 0  Past Medical History: Past Medical History:  Diagnosis Date  .  Acute renal failure (HCC)     Intractable nausea vomiting secondary to diabetic gastroparesis causing dehydration and acute renal failure /notes 04/01/2013  . Anginal pain ([place])   . Anxiety   . Bell's palsy 08/09/2010  . CAP (community acquired pneumonia) 10/2011   Archie Endo 11/08/2011  . Chest pain 01/13/2014  . CHF (congestive heart failure) ([place])   . Diabetic gastroparesis associated with type 2 diabetes mellitus ([place])    this is presumed diagnoses, not confirmed by any studies.   . Eczema   . Family history of malignant neoplasm of breast   . Family history of malignant neoplasm of ovary   . GERD (gastroesophageal reflux disease)   . High cholesterol   . Hypertension   . Liver mass 2014   biopsied 03/2013 at [facility], not malignant.  is to undergo radiologic ablation of the mass in sept/October 2014.   . Lupus ([place])   . Migraines    "maybe a couple times/yr" (04/01/2013)  . Obesity   . Obstructive sleep apnea on CPAP 2011  . On home oxygen therapy    "2L 24/7" (02/28/2015)  . SJOGREN'S SYNDROME 08/09/2010  . SLE (systemic lupus erythematosus) ([place])    Archie Endo 12/01/2010  . Stroke [facility] 2010; 10/2012   "left side is still weak from it, never fully regained full strength; no additions from stroke 10/2012"    Tobacco Use: Social History   Tobacco Use  Smoking Status Former Smoker  . Years: 3.00  . Types: Cigarettes  . Last attempt to quit: 06/01/1990  . Years since quitting: 27.7  Smokeless Tobacco Never Used    Labs: Recent Review Flowsheet Data     Labs for ITP Cardiac and Pulmonary Rehab Latest Ref Rng & Units 05/15/2017 09/11/2017 09/20/2017 09/20/2017 11/09/2017   Cholestrol 0 - 200 mg/dL - - - - -   LDLCALC 0 - 99 mg/dL - - - - -   HDL >39 mg/dL - - - - -   Trlycerides <150 mg/dL - - - - -   Hemoglobin A1c - 9.4 9.8 - - 6.9   PHART 7.350 - 7.450 - - - 7.324(L) -   PCO2ART 32.0 - 48.0 mmHg - - - 45.4 -   HCO3 20.0 - 28.0 mmol/L - - - 23.6 -   TCO2 22 - 32 mmol/L - - 24 25 -   ACIDBASEDEF 0.0 - 2.0 mmol/L - - - 3.0(H) -   O2SAT % - - - 99.0 -      Capillary Blood Glucose: Lab Results  Component Value Date   GLUCAP 209 (H) 09/23/2017   GLUCAP 168 (H) 09/23/2017   GLUCAP 204 (H) 09/22/2017   GLUCAP 211 (H) 09/22/2017   GLUCAP 227 (H) 09/22/2017     Exercise Target Goals: Date: 02/08/18  Exercise Program Goal: Individual exercise prescription set using results from initial 6 min walk test and THRR while considering  patient's activity barriers and safety.   Exercise Prescription Goal: Initial exercise prescription builds to 30-45 minutes a day of aerobic activity, 2-3 days per week.  Home exercise guidelines will be given to patient during program as part of exercise prescription that the participant will acknowledge.  Activity Barriers & Risk Stratification: Activity Barriers & Cardiac Risk Stratification - 02/08/18 0931      Activity Barriers & Cardiac Risk Stratification   Activity Barriers  Arthritis;Muscular Weakness;Deconditioning;Joint Problems;Shortness of Breath;Back Problems;Other (comment)    Comments  Lupus flare up    Cardiac Risk  Stratification  High       6 Minute Walk: 6 Minute Walk    Row Name 02/08/18 1228         6 Minute Walk   Phase  Initial     Distance  498 feet     Walk Time  6 minutes     # of Rest Breaks  0     MPH  0.94     METS  1.14     RPE  14     Perceived Dyspnea   2     VO2 Peak  4.01     Symptoms  Yes (comment)     Comments  Moderate SOB +2, Took break for 1 min and  29 secs due to SOB      Resting HR  85 bpm     Resting BP  120/80     Resting Oxygen Saturation   100 %     Exercise Oxygen Saturation  during 6 min walk  100 %     Max Ex. HR  120 bpm     Max Ex. BP  140/80     2 Minute Post BP  110/70        Oxygen Initial Assessment:   Oxygen Re-Evaluation:   Oxygen Discharge (Final Oxygen Re-Evaluation):   Initial Exercise Prescription: Initial Exercise Prescription - 02/08/18 1200      Date of Initial Exercise RX and Referring Provider   Date  02/08/18    Referring Provider  Adrian Prows MD     Expected Discharge Date  05/13/18      NuStep   Level  2    SPM  75    Minutes  10    METs  1.2      Arm Ergometer   Level  2    Watts  25    Minutes  10    METs  2      Track   Laps  4    Minutes  10    METs  0.9      Prescription Details   Frequency (times per week)  3x    Duration  Progress to 30 minutes of continuous aerobic without signs/symptoms of physical distress      Intensity   THRR 40-80% of Max Heartrate  67-138    Ratings of Perceived Exertion  11-13    Perceived Dyspnea  0-4      Progression   Progression  Continue progressive overload as per policy without signs/symptoms or physical distress.      Resistance Training   Training Prescription  Yes    Weight  4lbs    Reps  10-15       Perform Capillary Blood Glucose checks as needed.  Exercise Prescription Changes:   Exercise Comments:   Exercise Goals and Review: Exercise Goals    Row Name 02/08/18 0934             Exercise Goals   Increase Physical Activity  Yes       Intervention  Provide advice, education, support and counseling about physical activity/exercise needs.;Develop an individualized exercise prescription for aerobic and resistive training based on initial evaluation findings, risk stratification, comorbidities and participant's personal goals.       Expected Outcomes  Short Term: Attend rehab on a regular basis to increase amount  of physical activity.;Long Term: Add in home exercise to make exercise part of routine and to increase amount of  physical activity.;Long Term: Exercising regularly at least 3-5 days a week.       Increase Strength and Stamina  Yes       Intervention  Provide advice, education, support and counseling about physical activity/exercise needs.;Develop an individualized exercise prescription for aerobic and resistive training based on initial evaluation findings, risk stratification, comorbidities and participant's personal goals.       Expected Outcomes  Short Term: Increase workloads from initial exercise prescription for resistance, speed, and METs.;Short Term: Perform resistance training exercises routinely during rehab and add in resistance training at home;Long Term: Improve cardiorespiratory fitness, muscular endurance and strength as measured by increased METs and functional capacity (6MWT)       Able to understand and use rate of perceived exertion (RPE) scale  Yes       Intervention  Provide education and explanation on how to use RPE scale       Expected Outcomes  Short Term: Able to use RPE daily in rehab to express subjective intensity level;Long Term:  Able to use RPE to guide intensity level when exercising independently       Knowledge and understanding of Target Heart Rate Range (THRR)  Yes       Intervention  Provide education and explanation of THRR including how the numbers were predicted and where they are located for reference       Expected Outcomes  Short Term: Able to state/look up THRR;Long Term: Able to use THRR to govern intensity when exercising independently;Short Term: Able to use daily as guideline for intensity in rehab       Able to check pulse independently  Yes       Intervention  Provide education and demonstration on how to check pulse in carotid and radial arteries.;Review the importance of being able to check your own pulse for safety during independent exercise        Expected Outcomes  Short Term: Able to explain why pulse checking is important during independent exercise;Long Term: Able to check pulse independently and accurately       Understanding of Exercise Prescription  Yes       Intervention  Provide education, explanation, and written materials on patient's individual exercise prescription       Expected Outcomes  Short Term: Able to explain program exercise prescription;Long Term: Able to explain home exercise prescription to exercise independently          Exercise Goals Re-Evaluation :    Discharge Exercise Prescription (Final Exercise Prescription Changes):   Nutrition:  Target Goals: Understanding of nutrition guidelines, daily intake of sodium 1500mg , cholesterol 200mg , calories 30% from fat and 7% or less from saturated fats, daily to have 5 or more servings of fruits and vegetables.  Biometrics: Pre Biometrics - 02/08/18 1232      Pre Biometrics   Height  5' 8.75" (1.746 m)    Weight  396 lb 6.2 oz (179.8 kg)  (Abnormal)     Waist Circumference  62.25 inches    Hip Circumference  64 inches    Waist to Hip Ratio  0.97 %    BMI (Calculated)  58.98    Triceps Skinfold  66 mm    % Body Fat  67.7 %    Grip Strength  34 kg    Flexibility  9 in    Single Leg Stand  4 seconds        Nutrition Therapy Plan and Nutrition Goals:   Nutrition Assessments:  Nutrition Goals Re-Evaluation:   Nutrition Goals Re-Evaluation:   Nutrition Goals Discharge (Final Nutrition Goals Re-Evaluation):   Psychosocial: Target Goals: Acknowledge presence or absence of significant depression and/or stress, maximize coping skills, provide positive support system. Participant is able to verbalize types and ability to use techniques and skills needed for reducing stress and depression.  Initial Review & Psychosocial Screening: Initial Psych Review & Screening - 02/08/18 0918      Initial Review   Current issues with  Current Depression       Family Dynamics   Good Support System?  No no self identified support system    Concerns  No support system;Recent loss of significant other mother and sister     Comments  pt currently working with personal therapist, Helene Kelp at [facility]; pt also socially isolatd due to physical and emotional limitations      Barriers   Psychosocial barriers to participate in program  Psychosocial barriers identified (see note) self social isolation from depression and physical barriers, pt faith altered during grief process       Screening Interventions   Interventions  Encouraged to exercise;Program counselor consult;To provide support and resources with identified psychosocial needs;Provide feedback about the scores to participant    Expected Outcomes  Short Term goal: Utilizing psychosocial counselor, staff and physician to assist with identification of specific Stressors or current issues interfering with healing process. Setting desired goal for each stressor or current issue identified.;Long Term Goal: Stressors or current issues are controlled or eliminated.;Short Term goal: Identification and review with participant of any Quality of Life or Depression concerns found by scoring the questionnaire.;Long Term goal: The participant improves quality of Life and PHQ9 Scores as seen by post scores and/or verbalization of changes       Quality of Life Scores: Quality of Life - 02/08/18 0919      Quality of Life   Select  Quality of Life      Quality of Life Scores   Health/Function Pre  14.63 %    Socioeconomic Pre  16.06 %    Psych/Spiritual Pre  2.57 %    Family Pre  20.4 %    GLOBAL Pre  13.29 %      Scores of 19 and below usually indicate a poorer quality of life in these areas.  A difference of  2-3 points is a clinically meaningful difference.  A difference of 2-3 points in the total score of the Quality of Life Index has been associated with significant improvement in overall  quality of life, self-image, physical symptoms, and general health in studies assessing change in quality of life.  PHQ-9: Recent Review Flowsheet Data    There is no flowsheet data to display.     Interpretation of Total Score  Total Score Depression Severity:  1-4 = Minimal depression, 5-9 = Mild depression, 10-14 = Moderate depression, 15-19 = Moderately severe depression, 20-27 = Severe depression   Psychosocial Evaluation and Intervention:   Psychosocial Re-Evaluation:   Psychosocial Discharge (Final Psychosocial Re-Evaluation):   Vocational Rehabilitation: Provide vocational rehab assistance to qualifying candidates.   Vocational Rehab Evaluation & Intervention: Vocational Rehab - 02/08/18 0918      Initial Vocational Rehab Evaluation & Intervention   Assessment shows need for Vocational Rehabilitation  No medically disabled        Education: Education Goals: Education classes will be provided on a weekly basis, covering required topics. Participant will state understanding/return demonstration of topics presented.  Learning Barriers/Preferences: Learning Barriers/Preferences - 02/08/18 0930      Learning Barriers/Preferences   Learning Barriers  Sight    Learning Preferences  Video;Pictoral;Written Material       Education Topics: Count Your Pulse:  -Group instruction provided by verbal instruction, demonstration, patient participation and written materials to support subject.  Instructors address importance of being able to find your pulse and how to count your pulse when at home without a heart monitor.  Patients get hands on experience counting their pulse with staff help and individually.   Heart Attack, Angina, and Risk Factor Modification:  -Group instruction provided by verbal instruction, video, and written materials to support subject.  Instructors address signs and symptoms of angina and heart attacks.    Also discuss risk factors for heart disease  and how to make changes to improve heart health risk factors.   Functional Fitness:  -Group instruction provided by verbal instruction, demonstration, patient participation, and written materials to support subject.  Instructors address safety measures for doing things around the house.  Discuss how to get up and down off the floor, how to pick things up properly, how to safely get out of a chair without assistance, and balance training.   Meditation and Mindfulness:  -Group instruction provided by verbal instruction, patient participation, and written materials to support subject.  Instructor addresses importance of mindfulness and meditation practice to help reduce stress and improve awareness.  Instructor also leads participants through a meditation exercise.    Stretching for Flexibility and Mobility:  -Group instruction provided by verbal instruction, patient participation, and written materials to support subject.  Instructors lead participants through series of stretches that are designed to increase flexibility thus improving mobility.  These stretches are additional exercise for major muscle groups that are typically performed during regular warm up and cool down.   Hands Only CPR:  -Group verbal, video, and participation provides a basic overview of AHA guidelines for community CPR. Role-play of emergencies allow participants the opportunity to practice calling for help and chest compression technique with discussion of AED use.   Hypertension: -Group verbal and written instruction that provides a basic overview of hypertension including the most recent diagnostic guidelines, risk factor reduction with self-care instructions and medication management.    Nutrition I class: Heart Healthy Eating:  -Group instruction provided by PowerPoint slides, verbal discussion, and written materials to support subject matter. The instructor gives an explanation and review of the Therapeutic  Lifestyle Changes diet recommendations, which includes a discussion on lipid goals, dietary fat, sodium, fiber, plant stanol/sterol esters, sugar, and the components of a well-balanced, healthy diet.   Nutrition II class: Lifestyle Skills:  -Group instruction provided by PowerPoint slides, verbal discussion, and written materials to support subject matter. The instructor gives an explanation and review of label reading, grocery shopping for heart health, heart healthy recipe modifications, and ways to make healthier choices when eating out.   Diabetes Question & Answer:  -Group instruction provided by PowerPoint slides, verbal discussion, and written materials to support subject matter. The instructor gives an explanation and review of diabetes co-morbidities, pre- and post-prandial blood glucose goals, pre-exercise blood glucose goals, signs, symptoms, and treatment of hypoglycemia and hyperglycemia, and foot care basics.   Diabetes Blitz:  -Group instruction provided by PowerPoint slides, verbal discussion, and written materials to support subject matter. The instructor gives an explanation and review of the physiology behind type 1 and type 2 diabetes, diabetes medications and rational behind using different  medications, pre- and post-prandial blood glucose recommendations and Hemoglobin A1c goals, diabetes diet, and exercise including blood glucose guidelines for exercising safely.    Portion Distortion:  -Group instruction provided by PowerPoint slides, verbal discussion, written materials, and food models to support subject matter. The instructor gives an explanation of serving size versus portion size, changes in portions sizes over the last 20 years, and what consists of a serving from each food group.   Stress Management:  -Group instruction provided by verbal instruction, video, and written materials to support subject matter.  Instructors review role of stress in heart disease and how  to cope with stress positively.     Exercising on Your Own:  -Group instruction provided by verbal instruction, power point, and written materials to support subject.  Instructors discuss benefits of exercise, components of exercise, frequency and intensity of exercise, and end points for exercise.  Also discuss use of nitroglycerin and activating EMS.  Review options of places to exercise outside of rehab.  Review guidelines for sex with heart disease.   Cardiac Drugs I:  -Group instruction provided by verbal instruction and written materials to support subject.  Instructor reviews cardiac drug classes: antiplatelets, anticoagulants, beta blockers, and statins.  Instructor discusses reasons, side effects, and lifestyle considerations for each drug class.   Cardiac Drugs II:  -Group instruction provided by verbal instruction and written materials to support subject.  Instructor reviews cardiac drug classes: angiotensin converting enzyme inhibitors (ACE-I), angiotensin II receptor blockers (ARBs), nitrates, and calcium channel blockers.  Instructor discusses reasons, side effects, and lifestyle considerations for each drug class.   Anatomy and Physiology of the Circulatory System:  Group verbal and written instruction and models provide basic cardiac anatomy and physiology, with the coronary electrical and arterial systems. Review of: AMI, Angina, Valve disease, Heart Failure, Peripheral Artery Disease, Cardiac Arrhythmia, Pacemakers, and the ICD.   Other Education:  -Group or individual verbal, written, or video instructions that support the educational goals of the cardiac rehab program.   Holiday Eating Survival Tips:  -Group instruction provided by PowerPoint slides, verbal discussion, and written materials to support subject matter. The instructor gives patients tips, tricks, and techniques to help them not only survive but enjoy the holidays despite the onslaught of food that accompanies  the holidays.   Knowledge Questionnaire Score: Knowledge Questionnaire Score - 02/08/18 0918      Knowledge Questionnaire Score   Pre Score  14/24       Core Components/Risk Factors/Patient Goals at Admission: Personal Goals and Risk Factors at Admission - 02/08/18 1227      Core Components/Risk Factors/Patient Goals on Admission    Weight Management  Yes;Obesity;Weight Loss    Intervention  Weight Management: Develop a combined nutrition and exercise program designed to reach desired caloric intake, while maintaining appropriate intake of nutrient and fiber, sodium and fats, and appropriate energy expenditure required for the weight goal.;Weight Management: Provide education and appropriate resources to help participant work on and attain dietary goals.;Weight Management/Obesity: Establish reasonable short term and long term weight goals.;Obesity: Provide education and appropriate resources to help participant work on and attain dietary goals.    Admit Weight  396 lb 6.2 oz (179.8 kg)    Goal Weight: Short Term  385 lb (174.6 kg)    Goal Weight: Long Term  376 lb (170.6 kg)    Expected Outcomes  Short Term: Continue to assess and modify interventions until short term weight is achieved;Long Term: Adherence to nutrition  and physical activity/exercise program aimed toward attainment of established weight goal;Weight Loss: Understanding of general recommendations for a balanced deficit meal plan, which promotes 1-2 lb weight loss per week and includes a negative energy balance of [phone] kcal/d;Understanding recommendations for meals to include 15-35% energy as protein, 25-35% energy from fat, 35-60% energy from carbohydrates, less than 200mg  of dietary cholesterol, 20-35 gm of total fiber daily;Understanding of distribution of calorie intake throughout the day with the consumption of 4-5 meals/snacks    Diabetes  Yes    Intervention  Provide education about signs/symptoms and action to take for  hypo/hyperglycemia.;Provide education about proper nutrition, including hydration, and aerobic/resistive exercise prescription along with prescribed medications to achieve blood glucose in normal ranges: Fasting glucose 65-99 mg/dL    Expected Outcomes  Short Term: Participant verbalizes understanding of the signs/symptoms and immediate care of hyper/hypoglycemia, proper foot care and importance of medication, aerobic/resistive exercise and nutrition plan for blood glucose control.;Long Term: Attainment of HbA1C < 7%.    Heart Failure  Yes    Intervention  Provide a combined exercise and nutrition program that is supplemented with education, support and counseling about heart failure. Directed toward relieving symptoms such as shortness of breath, decreased exercise tolerance, and extremity edema.    Expected Outcomes  Improve functional capacity of life;Short term: Attendance in program 2-3 days a week with increased exercise capacity. Reported lower sodium intake. Reported increased fruit and vegetable intake. Reports medication compliance.;Short term: Daily weights obtained and reported for increase. Utilizing diuretic protocols set by physician.;Long term: Adoption of self-care skills and reduction of barriers for early signs and symptoms recognition and intervention leading to self-care maintenance.    Hypertension  Yes    Intervention  Monitor prescription use compliance.;Provide education on lifestyle modifcations including regular physical activity/exercise, weight management, moderate sodium restriction and increased consumption of fresh fruit, vegetables, and low fat dairy, alcohol moderation, and smoking cessation.    Expected Outcomes  Short Term: Continued assessment and intervention until BP is < 140/1mm HG in hypertensive participants. < 130/89mm HG in hypertensive participants with diabetes, heart failure or chronic kidney disease.;Long Term: Maintenance of blood pressure at goal levels.     Lipids  Yes    Intervention  Provide education and support for participant on nutrition & aerobic/resistive exercise along with prescribed medications to achieve LDL 70mg , HDL >40mg .    Expected Outcomes  Short Term: Participant states understanding of desired cholesterol values and is compliant with medications prescribed. Participant is following exercise prescription and nutrition guidelines.;Long Term: Cholesterol controlled with medications as prescribed, with individualized exercise RX and with personalized nutrition plan. Value goals: LDL < 70mg , HDL > 40 mg.       Core Components/Risk Factors/Patient Goals Review:    Core Components/Risk Factors/Patient Goals at Discharge (Final Review):    ITP Comments: ITP Comments    Row Name 02/07/18 1536           ITP Comments  Dr. Fransico Him, Medical Director          Comments: Patient attended orientation from 0901 to 1056 to review rules and guidelines for program. Completed 6 minute walk test, Intitial ITP, and exercise prescription.  VSS. Telemetry-SR with PVCs.  Asymptomatic.

## 2018-02-08 NOTE — Progress Notes (Signed)
[name] 47 y.o. female DOB: 1970-09-01 MRN: [id number]      Nutrition Note  1. Chronic systolic congestive heart failure (HCC)    Past Medical History:  Diagnosis Date  . Acute renal failure (HCC)     Intractable nausea vomiting secondary to diabetic gastroparesis causing dehydration and acute renal failure /notes 04/01/2013  . Anginal pain ([place])   . Anxiety   . Bell's palsy 08/09/2010  . CAP (community acquired pneumonia) 10/2011   Archie Endo 11/08/2011  . Chest pain 01/13/2014  . CHF (congestive heart failure) ([place])   . Diabetic gastroparesis associated with type 2 diabetes mellitus ([place])    this is presumed diagnoses, not confirmed by any studies.   . Eczema   . Family history of malignant neoplasm of breast   . Family history of malignant neoplasm of ovary   . GERD (gastroesophageal reflux disease)   . High cholesterol   . Hypertension   . Liver mass 2014   biopsied 03/2013 at [facility], not malignant.  is to undergo radiologic ablation of the mass in sept/October 2014.   . Lupus ([place])   . Migraines    "maybe a couple times/yr" (04/01/2013)  . Obesity   . Obstructive sleep apnea on CPAP 2011  . On home oxygen therapy    "2L 24/7" (02/28/2015)  . SJOGREN'S SYNDROME 08/09/2010  . SLE (systemic lupus erythematosus) ([place])    Archie Endo 12/01/2010  . Stroke [facility]) 2010; 10/2012   "left side is still weak from it, never fully regained full strength; no additions from stroke 10/2012"   Meds reviewed. Byetta, insulin, pravastatin, metformin noted  HT: Ht Readings from Last 1 Encounters:  02/08/18 5' 8.75" (1.746 m)    WT: Wt Readings from Last 5 Encounters:  02/08/18 (!) 396 lb 6.2 oz (179.8 kg)  10/09/17 (!) 405 lb (183.7 kg)  09/23/17 (!) 405 lb 13.9 oz (184.1 kg)  09/11/17 (!) 413 lb (187.3 kg)  06/06/17 (!) 404 lb 12.2 oz (183.6 kg)     Body mass index is 58.96 kg/m.   Current tobacco use? No   Labs:  Lipid Panel     Component Value Date/Time   CHOL 160 03/06/2014 1425    TRIG 125 03/06/2014 1425   HDL 57 03/06/2014 1425   CHOLHDL 2.8 03/06/2014 1425   VLDL 25 03/06/2014 1425   LDLCALC 78 03/06/2014 1425    Lab Results  Component Value Date   HGBA1C 6.9 11/09/2017   CBG (last 3)  No results for input(s): GLUCAP in the last 72 hours.  Nutrition Note Spoke with pt. Nutrition plan and goals reviewed with pt. Pt is following Step 1 of the Therapeutic Lifestyle Changes diet. Pt wants to lose wt. Pt has not been trying to lose wt actively, is in pre-contemplative state, will plan to use education to increase pts awareness around behaviors and pattens that are current barriers to weight loss. Wt loss tips reviewed with patient. Pt is diabetic. Last A1c indicates blood glucose well-controlled. Pt with dx of CHF. Per discussion, pt does not use canned/convenience foods often. Pt rarely adds salt to food. Pt eats out infrequently, as she prefers to cook and eat meals at home. Pt eats 2-3x a day, will occasionally forget to eat lunch if she is out or gets busy. Pt expressed understanding of the information reviewed. Pt aware of nutrition education classes offered plans on attending nutrition classes.  Nutrition Diagnosis ? Food-and nutrition-related knowledge deficit related to lack  of exposure to information as related to diagnosis of: ? CVD ? DM ?  ? Obesity related to excessive energy intake as evidenced by a Body mass index is 58.96 kg/m.  Nutrition Intervention ? Pt's individual nutrition plan and goals reviewed with pt.   Nutrition Goal(s):   ? Pt to identify and limit food sources of saturated fat, trans fat, and sodium ? Pt to identify food quantities necessary to achieve weight loss of 6-24 lbs. at graduation from cardiac rehab. Goal wt loss of 20 lb desired.  ? Pt to eat a variety of non-starchy vegetables.   Plan:  ? Pt to attend nutrition classes ? Nutrition I ? Nutrition II ? Portion Distortion  ? Diabetes Blitz ? Diabetes Q & Ae  determined ? Will provide client-centered nutrition education as part of interdisciplinary care ? Monitor and evaluate progress toward nutrition goal with team.   Laurina Bustle, MS, RD, LDN 02/08/2018 1:44 PM

## 2018-02-14 ENCOUNTER — Encounter (HOSPITAL_COMMUNITY): Payer: Self-pay

## 2018-02-14 ENCOUNTER — Encounter (HOSPITAL_COMMUNITY)
Admission: RE | Admit: 2018-02-14 | Discharge: 2018-02-14 | Disposition: A | Discharge status: Home / Self Care | Payer: Medicaid Other | Source: Ambulatory Visit | Attending: Cardiology | Admitting: Cardiology

## 2018-02-14 ENCOUNTER — Ambulatory Visit: Payer: Medicaid Other | Admitting: Endocrinology

## 2018-02-14 ENCOUNTER — Encounter (HOSPITAL_COMMUNITY): Payer: Medicaid Other

## 2018-02-14 DIAGNOSIS — I110 Hypertensive heart disease with heart failure: Secondary | ICD-10-CM | POA: Diagnosis not present

## 2018-02-14 DIAGNOSIS — I5022 Chronic systolic (congestive) heart failure: Secondary | ICD-10-CM

## 2018-02-14 LAB — GLUCOSE, CAPILLARY
GLUCOSE-CAPILLARY: 180 mg/dL — AB (ref 70–99)
Glucose-Capillary: 157 mg/dL — ABNORMAL HIGH (ref 70–99)

## 2018-02-14 NOTE — Progress Notes (Signed)
[name] 47 y.o. female Nutrition Note Spoke with pt. Nutrition Plan and Nutrition Survey goals reviewed with pt. Pt is following a Heart Healthy diet. Pt wants to lose wt. Has not been actively trying to lose weight, shared she was waiting to speak with dietitian to ensure she was eating in a healthful manner. Wt loss tips reviewed. Pt is diabetic. Last A1c indicates blood glucose well-controlled. 6.9 (11/09/2017), is down from 9.8 (09/11/17). This Probation officer went over Diabetes Education test results. Pt checks CBG's 1-2 times a day. Fasting CBG's reportedly 120-130 mg/dL. Pt with dx of CHF. Per discussion, pt does not use canned/convenience foods often. Pt rarely adds salt to food. Pt eats out infrequently, she prefers to make food at home. Pt reported eating 2-3 meals a day, sometimes forgets to eat lunch when busy. Discussed eating frequently across the day and eating a consistent amount of carbohydrates as part of meal plan. Pt expressed understanding of the information reviewed. Pt aware of nutrition education classes offered and plans on attending nutrition classes.  Lab Results  Component Value Date   HGBA1C 6.9 11/09/2017    Wt Readings from Last 3 Encounters:  02/08/18 (!) 396 lb 6.2 oz (179.8 kg)  10/09/17 (!) 405 lb (183.7 kg)  09/23/17 (!) 405 lb 13.9 oz (184.1 kg)    Nutrition Diagnosis ? Food-and nutrition-related knowledge deficit related to lack of exposure to information as related to diagnosis of: ? CVD ? CHF ? DM  ? Obesity related to excessive energy intake as evidenced by a BMI of 58.96   Nutrition Intervention ? Pt's individual nutrition plan reviewed with pt. ? Benefits of adopting Heart Healthy diet discussed when [employer] reviewed.   ? Pt given handouts for: ? Nutrition I class ? Nutrition II class ? Diabetes Blitz Class ? Consistent vit K diet ? low sodium ? DM ? pre-diabetes ? Diabetes Q & A class ? Continue client-centered nutrition education by RD, as part of  interdisciplinary care.  Goal(s)  ? Pt to identify and limit food sources of saturated fat, trans fat, and sodium ? Pt to identify food quantities necessary to achieve weight loss of 6-24 lb at graduation from cardiac rehab.   Plan:  Pt to attend nutrition classes ? Nutrition I ? Nutrition II ? Portion Distortion  Will provide client-centered nutrition education as part of interdisciplinary care.   Monitor and evaluate progress toward nutrition goal with team.   Laurina Bustle, MS, RD, LDN 02/14/2018 1:23 PM

## 2018-02-14 NOTE — Progress Notes (Signed)
Daily Session Note  Patient Details  Name: URIYAH MASSIMO MRN: [id number] Date of Birth: [birth year] Referring Provider:     CARDIAC REHAB PHASE II ORIENTATION from 02/08/2018 in [place]  Referring Provider  Adrian Prows MD       Encounter Date: 02/14/2018  Check In: Session Check In - 02/14/18 1230      Check-In   Location  [facility]    Staff Present  Barnet Pall, RN, BSN;Amber Fair, MS, ACSM RCEP, Exercise Physiologist;Mickenzie Stolar Karle Starch, RN Deland Pretty, MS, ACSM CEP, Exercise Physiologist    Supervising physician immediately available to respond to emergencies  [facility] immediately available    Physician(s)  Dr. Broadus John    Medication changes reported      No    Fall or balance concerns reported     No    Tobacco Cessation  No Change    Warm-up and Cool-down  Performed as group-led instruction    Resistance Training Performed  No    VAD Patient?  No    PAD/SET Patient?  No      Pain Assessment   Currently in Pain?  No/denies       Capillary Blood Glucose: Results for orders placed or performed during the hospital encounter of 02/14/18 (from the past 24 hour(s))  Glucose, capillary     Status: Abnormal   Collection Time: 02/14/18 11:42 AM  Result Value Ref Range   Glucose-Capillary 157 (H) 70 - 99 mg/dL  Glucose, capillary     Status: Abnormal   Collection Time: 02/14/18 12:32 PM  Result Value Ref Range   Glucose-Capillary 180 (H) 70 - 99 mg/dL      Social History   Tobacco Use  Smoking Status Former Smoker  . Years: 3.00  . Types: Cigarettes  . Last attempt to quit: 06/01/1990  . Years since quitting: 27.7  Smokeless Tobacco Never Used    Goals Met:  Exercise tolerated well  Goals Unmet:  Not Applicable  Comments: Pt started cardiac rehab today.  Pt tolerated light exercise without difficulty. VSS, telemetry-SR, asymptomatic.  Medication list reconciled. Pt denies barriers to medicaiton  compliance.  PSYCHOSOCIAL ASSESSMENT:  PHQ-2.  Meliah has current depression. She is not happy with her health and general appearance.  She has also suffered loss of her support system. She meets with her therapists and feels that this is helpful. She has been socially isolated due to depression and physical barriers but is starting to do things with her neighbor. Previously Lilliann enjoyed traveling and going to museums.  Pt oriented to exercise equipment and routine.    Understanding verbalized.   Dr. Fransico Him is Medical Director for Cardiac Rehab at [facility]

## 2018-02-14 NOTE — Progress Notes (Signed)
QUALITY OF LIFE SCORE REVIEW  Pt completed Quality of Life survey as a participant in Cardiac Rehab. Scores 21.0 or below are considered low. Pt score very low in several areas Overall 13.28, Health and Function 14.63, socioeconomic 16.56, physiological and spiritual 2.57, family 20.4. [name] has current depression. She is not happy with her health and general appearance.  She has also suffered loss of her support system. She meets with her therapists and feels that this is helpful. She has been socially isolated due to depression and physical barriers but is starting to do things with her neighbor like going to church. Previously [name] enjoyed traveling and going to museums.  Offered emotional support and reassurance.  [name] is willing to participate in [place] for doing so.  Will continue to monitor and intervene as necessary.  Quality of Life Scores faxed to PCP with pt approval.

## 2018-02-16 ENCOUNTER — Encounter (HOSPITAL_COMMUNITY)
Admission: RE | Admit: 2018-02-16 | Discharge: 2018-02-16 | Disposition: A | Discharge status: Home / Self Care | Payer: Medicaid Other | Source: Ambulatory Visit | Attending: Cardiology | Admitting: Cardiology

## 2018-02-16 ENCOUNTER — Encounter (HOSPITAL_COMMUNITY): Payer: Medicaid Other

## 2018-02-16 DIAGNOSIS — I5022 Chronic systolic (congestive) heart failure: Secondary | ICD-10-CM

## 2018-02-16 DIAGNOSIS — I110 Hypertensive heart disease with heart failure: Secondary | ICD-10-CM | POA: Diagnosis not present

## 2018-02-16 LAB — GLUCOSE, CAPILLARY
GLUCOSE-CAPILLARY: 186 mg/dL — AB (ref 70–99)
Glucose-Capillary: 129 mg/dL — ABNORMAL HIGH (ref 70–99)

## 2018-02-19 ENCOUNTER — Encounter (HOSPITAL_COMMUNITY): Payer: Medicaid Other

## 2018-02-19 ENCOUNTER — Telehealth (HOSPITAL_COMMUNITY): Payer: Self-pay

## 2018-02-21 ENCOUNTER — Encounter: Payer: Self-pay | Admitting: Endocrinology

## 2018-02-21 ENCOUNTER — Encounter (HOSPITAL_COMMUNITY): Payer: Medicaid Other

## 2018-02-21 ENCOUNTER — Telehealth (HOSPITAL_COMMUNITY): Payer: Self-pay | Admitting: Family Medicine

## 2018-02-21 ENCOUNTER — Ambulatory Visit (INDEPENDENT_AMBULATORY_CARE_PROVIDER_SITE_OTHER): Payer: Medicaid Other | Admitting: Endocrinology

## 2018-02-21 VITALS — BP 118/82 | HR 82 | Ht 68.0 in | Wt 390.6 lb

## 2018-02-21 DIAGNOSIS — E1143 Type 2 diabetes mellitus with diabetic autonomic (poly)neuropathy: Secondary | ICD-10-CM | POA: Diagnosis not present

## 2018-02-21 DIAGNOSIS — Z794 Long term (current) use of insulin: Secondary | ICD-10-CM | POA: Diagnosis not present

## 2018-02-21 LAB — POCT GLYCOSYLATED HEMOGLOBIN (HGB A1C): HEMOGLOBIN A1C: 6.4 % — AB (ref 4.0–5.6)

## 2018-02-21 MED ORDER — INSULIN NPH (HUMAN) (ISOPHANE) 100 UNIT/ML ~~LOC~~ SUSP: 200.0000 [IU] | SUBCUTANEOUS | 11 refills | Status: DC

## 2018-02-21 NOTE — Progress Notes (Signed)
Subjective:    Patient ID: [name], female    DOB: 09/16/1970, 47 y.o.   MRN: [id number]  HPI Pt returns for f/u of diabetes mellitus: DM type: Insulin-requiring type 2 Dx'ed: 0141 Complications: polyneuropathy and gastroparesis.  Therapy: insulin (since 2014), Byetta, and metformin.   GDM: never.  DKA: never Severe hypoglycemia: never.  Pancreatitis: never.  Other: she is too ill to undergo weight-loss surgery; she takes qd insulin, after poor results with multiple daily injections.   Interval history: she is tapering prednisone (still 3 mg qd)  no cbg record, but states cbg's are approx 100.  She says she never misses the insulin.  She seldom has hypoglycemia, and these episodes are mild.   Past Medical History:  Diagnosis Date  . Acute renal failure (HCC)     Intractable nausea vomiting secondary to diabetic gastroparesis causing dehydration and acute renal failure /notes 04/01/2013  . Anginal pain ([place])   . Anxiety   . Bell's palsy 08/09/2010  . CAP (community acquired pneumonia) 10/2011   Archie Endo 11/08/2011  . Chest pain 01/13/2014  . CHF (congestive heart failure) ([place])   . Diabetic gastroparesis associated with type 2 diabetes mellitus ([place])    this is presumed diagnoses, not confirmed by any studies.   . Eczema   . Family history of malignant neoplasm of breast   . Family history of malignant neoplasm of ovary   . GERD (gastroesophageal reflux disease)   . High cholesterol   . Hypertension   . Liver mass 2014   biopsied 03/2013 at [facility], not malignant.  is to undergo radiologic ablation of the mass in sept/October 2014.   . Lupus ([place])   . Migraines    "maybe a couple times/yr" (04/01/2013)  . Obesity   . Obstructive sleep apnea on CPAP 2011  . On home oxygen therapy    "2L 24/7" (02/28/2015)  . SJOGREN'S SYNDROME 08/09/2010  . SLE (systemic lupus erythematosus) ([place])    Archie Endo 12/01/2010  . Stroke [facility]) 2010; 10/2012   "left side is still weak from it, never  fully regained full strength; no additions from stroke 10/2012"    Past Surgical History:  Procedure Laterality Date  . BREAST CYST EXCISION Left 08/2005   epidermoid  . CARDIAC CATHETERIZATION  02/07/12  . ECTOPIC PREGNANCY SURGERY  1999  . ESOPHAGOGASTRODUODENOSCOPY N/A 02/26/2013   Procedure: ESOPHAGOGASTRODUODENOSCOPY (EGD);  Surgeon: Irene Shipper, MD;  Location: [facility] ENDOSCOPY;  Service: Endoscopy;  Laterality: N/A;  . ESOPHAGOGASTRODUODENOSCOPY N/A 03/02/2015   Procedure: ESOPHAGOGASTRODUODENOSCOPY (EGD);  Surgeon: Milus Banister, MD;  Location: [place];  Service: Endoscopy;  Laterality: N/A;  . HERNIA REPAIR    . LEFT AND RIGHT HEART CATHETERIZATION WITH CORONARY ANGIOGRAM N/A 02/07/2012   Procedure: LEFT AND RIGHT HEART CATHETERIZATION WITH CORONARY ANGIOGRAM;  Surgeon: Laverda Page, MD;  Location: [facility] CATH LAB;  Service: Cardiovascular;  Laterality: N/A;  . LIVER BIOPSY  03/2013   liver mass/medical hx noted above  . MUSCLE BIOPSY     for lupus/notes 12/01/2010  . UMBILICAL HERNIA REPAIR  1980's    Social History   Socioeconomic History  . Marital status: Single    Spouse name: Not on file  . Number of children: Not on file  . Years of education: Not on file  . Highest education level: Not on file  Occupational History    Employer: UNEMPLOYED    Comment: student  Social Needs  . Financial resource strain: Not  on file  . Food insecurity:    Worry: Not on file    Inability: Not on file  . Transportation needs:    Medical: Not on file    Non-medical: Not on file  Tobacco Use  . Smoking status: Former Smoker    Years: 3.00    Types: Cigarettes    Last attempt to quit: 06/01/1990    Years since quitting: 27.7  . Smokeless tobacco: Never Used  Substance and Sexual Activity  . Alcohol use: No    Alcohol/week: 0.0 oz  . Drug use: No  . Sexual activity: Yes    Partners: Male    Birth control/protection: None  Lifestyle  . Physical activity:    Days per week: Not  on file    Minutes per session: Not on file  . Stress: Not on file  Relationships  . Social connections:    Talks on phone: Not on file    Gets together: Not on file    Attends religious service: Not on file    Active member of club or organization: Not on file    Attends meetings of clubs or organizations: Not on file    Relationship status: Not on file  . Intimate partner violence:    Fear of current or ex partner: Not on file    Emotionally abused: Not on file    Physically abused: Not on file    Forced sexual activity: Not on file  Other Topics Concern  . Not on file  Social History Narrative   Regular exercise-yes    Current Outpatient Medications on File Prior to Visit  Medication Sig Dispense Refill  . ACCU-CHEK AVIVA PLUS test strip USE TO CHECK BLOOD SUGAR THREE TIMES A DAY 300 each 0  . ACCU-CHEK SOFTCLIX LANCETS lancets USE TO TEST BLOOD SUGAR 3 TIMES A DAY 300 each 0  . albuterol (PROAIR HFA) 108 (90 BASE) MCG/ACT inhaler Inhale 2 puffs into the lungs every 6 (six) hours as needed for wheezing or shortness of breath.     Marland Kitchen albuterol (PROVENTIL) (2.5 MG/3ML) 0.083% nebulizer solution Take 2.5 mg by nebulization every 6 (six) hours as needed for wheezing.    Marland Kitchen alendronate (FOSAMAX) 70 MG tablet Take 70 mg by mouth every Saturday. Take with a full glass of water on an empty stomach.    . ALPRAZolam (XANAX) 0.5 MG tablet Take 0.5 mg 2 (two) times daily as needed by mouth for anxiety or sleep.     Marland Kitchen aspirin EC 81 MG tablet Take 81 mg by mouth daily.    Marland Kitchen azaTHIOprine (IMURAN) 50 MG tablet Take 100 mg by mouth 2 (two) times daily.     . Blood Glucose Monitoring Suppl (ACCU-CHEK AVIVA) device Use as instructed 1 each 0  . buPROPion (WELLBUTRIN) 100 MG tablet Take 100 mg by mouth every morning.    . carvedilol (COREG) 12.5 MG tablet Take 3 tablets (37.5 mg total) 2 (two) times daily with a meal by mouth. 180 tablet 0  . cetirizine (ZYRTEC) 10 MG tablet Take 10 mg by mouth daily  as needed for allergies.     Marland Kitchen clopidogrel (PLAVIX) 75 MG tablet Take 75 mg by mouth daily.    . clotrimazole-betamethasone (LOTRISONE) cream Apply 1 application daily as needed topically (itching/breakouts on feet).     . cyclobenzaprine (FLEXERIL) 10 MG tablet Take 10 mg 2 (two) times daily as needed by mouth.     . cycloSPORINE (RESTASIS) 0.05 %  ophthalmic emulsion Place 1 drop into both eyes 3 (three) times daily.    . diclofenac sodium (VOLTAREN) 1 % GEL Apply 2-4 g 4 (four) times daily as needed topically (joint pain).    Marland Kitchen doxepin (SINEQUAN) 50 MG capsule Take 50-100 mg by mouth at bedtime as needed (for insomnia).     Marland Kitchen etonogestrel-ethinyl estradiol (NUVARING) 0.12-0.015 MG/24HR vaginal ring Place 1 each every 28 (twenty-eight) days vaginally. Insert vaginally and leave in place for 3 consecutive weeks, then remove for 1 week.    Marland Kitchen exenatide (BYETTA) 10 MCG/0.04ML SOPN injection Inject 0.04 mLs (10 mcg total) into the skin 2 (two) times daily with a meal. And pen needles 2/day (Patient taking differently: Inject 10 mcg into the skin 2 (two) times daily with a meal. ) 2.4 mL 11  . famotidine (PEPCID) 20 MG tablet Take 20 mg by mouth 2 (two) times daily.    Marland Kitchen glucose blood (ACCU-CHEK AVIVA PLUS) test strip Used to check blood sugar 5x daily. 450 each 2  . glucose blood (ACCU-CHEK GUIDE) test strip Used to check blood sugars 5x daily. 200 each 12  . hydrALAZINE (APRESOLINE) 50 MG tablet Take 50 mg by mouth 3 (three) times daily.    . hydrochlorothiazide (HYDRODIURIL) 25 MG tablet Take 25 mg by mouth daily.    . hydroxychloroquine (PLAQUENIL) 200 MG tablet Take 200 mg by mouth 2 (two) times daily.     . Insulin Pen Needle 32G X 4 MM MISC Used to inject insulin 1x daily. 100 each 5  . Insulin Syringe-Needle U-100 (COMFORT ASSIST INSULIN SYRINGE) 31G X 5/16" 1 ML MISC Use one daily with insulin 90 each 4  . Lancets (ACCU-CHEK MULTICLIX) lancets Used to check blood sugars daily up to 5 times. 204  each 12  . lidocaine (LIDODERM) 5 % Place 1 patch onto the skin daily. Remove & Discard patch within 12 hours or as directed by MD (Patient not taking: Reported on 09/21/2017) 30 patch 0  . magnesium oxide (MAG-OX) 400 MG tablet Take 400 mg by mouth daily.    . megestrol (MEGACE) 40 MG tablet Take 40 mg by mouth daily.  3  . metFORMIN (GLUCOPHAGE-XR) 500 MG 24 hr tablet Take 1,000 mg by mouth 2 (two) times daily.    . mometasone-formoterol (DULERA) 100-5 MCG/ACT AERO Inhale 2 puffs into the lungs 2 (two) times daily.    Marland Kitchen morphine (MSIR) 15 MG tablet Take 1 tablet (15 mg total) every 6 (six) hours as needed by mouth for severe pain. (Patient not taking: Reported on 09/21/2017) 6 tablet 0  . olmesartan (BENICAR) 40 MG tablet Take 40 mg by mouth daily.    . Olopatadine HCl (PAZEO) 0.7 % SOLN Place 1 drop daily as needed into both eyes (itching/irritation). Pazeo    . oxybutynin (DITROPAN-XL) 10 MG 24 hr tablet Take 10 mg by mouth daily.    . Polyvinyl Alcohol-Povidone (REFRESH OP) Place 1 drop 2 (two) times daily into both eyes.     . potassium chloride SA (K-DUR,KLOR-CON) 20 MEQ tablet Take 20 mEq by mouth daily.     . pravastatin (PRAVACHOL) 10 MG tablet Take 10 mg by mouth daily.    . predniSONE (DELTASONE) 1 MG tablet Take 3 mg by mouth daily.     . pregabalin (LYRICA) 75 MG capsule Take 75 mg 3 (three) times daily by mouth.     . sacubitril-valsartan (ENTRESTO) 97-103 MG Take 1 tablet by mouth 2 (two) times daily.    Marland Kitchen  topiramate (TOPAMAX) 25 MG tablet Take 75 mg by mouth at bedtime.    . torsemide (DEMADEX) 20 MG tablet 40 mg in AM and 20 mg in PM (Patient taking differently: 20 mg daily. ) 30 tablet 0  . traMADol (ULTRAM) 50 MG tablet TAKE 1 TO 2 TABLETS BY MOUTH EVERY 6 HOURS AS NEEDED FOR PAIN**PA REQ** 60 tablet 0   No current facility-administered medications on file prior to visit.     Allergies  Allergen Reactions  . Metoclopramide Other (See Comments)    Developed restless leg,  akathisia type limb movements.   . Codeine Itching, Rash and Other (See Comments)  . Penicillins Itching    Has patient had a PCN reaction causing immediate rash, facial/tongue/throat swelling, SOB or lightheadedness with hypotension: No Has patient had a PCN reaction causing severe rash involving mucus membranes or skin necrosis: No Has patient had a PCN reaction that required hospitalization: No Has patient had a PCN reaction occurring within the last 10 years:yes If all of the above answers are "NO", then may proceed with Cephalosporin use.  Marland Kitchen Hydrocodone Rash  . Percocet [Oxycodone-Acetaminophen] Hives and Rash    Tolerates dilaudid    Family History  Problem Relation Age of Onset  . Diabetes Mother   . Asthma Mother   . Urticaria Mother   . Breast cancer Maternal Aunt 17       currently 47  . Stomach cancer Maternal Uncle        dx 3s; deceased  . Prostate cancer Maternal Uncle        deceased 47s  . Cancer Maternal Aunt        unk. primary  . Cancer Maternal Aunt        unk. primary  . Ovarian cancer Maternal Aunt        deceased 51s  . Hypertension Other   . Stroke Other   . Arthritis Other   . Ovarian cancer Sister 40       maternal half-sister; RAD51C positive  . Cancer Paternal Aunt        unk. primary; deceased 51s  . Prostate cancer Paternal Uncle        deceased 12s  . Breast cancer Cousin        deceased 96s; daughter of mat uncle with stomach ca  . Breast cancer Maternal Aunt        deceased 80s  . Asthma Brother   . Asthma Brother   . Allergic rhinitis Neg Hx   . Angioedema Neg Hx     BP 118/82 (BP Location: Right Arm, Patient Position: Sitting, Cuff Size: Normal)   Pulse 82   Ht 5' 8"  (1.727 m)   Wt (!) 390 lb 9.6 oz (177.2 kg)   LMP 03/24/2017   SpO2 98%   BMI 59.39 kg/m    Review of Systems She denies LOC.  Nausea is mild.  She has lost weight    Objective:   Physical Exam VITAL SIGNS:  See vs page GENERAL: no distress Pulses:  dorsalis pedis intact bilat.   MSK: no deformity of the feet CV: trace bilat leg edema Skin:  no ulcer on the feet, but the skin is dry.  normal color and temp on the feet. Neuro: sensation is intact to touch on the feet   Lab Results  Component Value Date   HGBA1C 6.4 (A) 02/21/2018       Assessment & Plan:  Insulin-requiring type 2 DM, with  polyneuropathy.  Overcontrolled, given this regimen, which does match insulin to her changing needs throughout the day.   Weight loss: I told pt that, despite hypoglycemia, she should continue her efforts.   Nausea: we discussed.  Please continue the same metformin and Byetta.   Patient Instructions  check your blood sugar 4 times a day: before the 3 meals, and at bedtime.  also check if you have symptoms of your blood sugar being too high or too low.  please keep a record of the readings and bring it to your next appointment here (or you can bring the meter itself).  You can write it on any piece of paper.  please call us sooner if your blood sugar goes below 70, or if you have a lot of readings over 200. Please continue the same metformin and Byetta, and: Reduce the NPH insulin to 200 units each morning.   On this type of insulin schedule, you should eat meals on a regular schedule (especially lunch).  If a meal is missed or significantly delayed, your blood sugar could go low. Please come back for a follow-up appointment in 3 months.

## 2018-02-21 NOTE — Patient Instructions (Addendum)
check your blood sugar 4 times a day: before the 3 meals, and at bedtime.  also check if you have symptoms of your blood sugar being too high or too low.  please keep a record of the readings and bring it to your next appointment here (or you can bring the meter itself).  You can write it on any piece of paper.  please call us sooner if your blood sugar goes below 70, or if you have a lot of readings over 200. Please continue the same metformin and Byetta, and: Reduce the NPH insulin to 200 units each morning.   On this type of insulin schedule, you should eat meals on a regular schedule (especially lunch).  If a meal is missed or significantly delayed, your blood sugar could go low. Please come back for a follow-up appointment in 3 months.

## 2018-02-22 ENCOUNTER — Other Ambulatory Visit: Payer: Self-pay

## 2018-02-22 MED ORDER — INSULIN NPH (HUMAN) (ISOPHANE) 100 UNIT/ML ~~LOC~~ SUSP: 200.0000 [IU] | SUBCUTANEOUS | 11 refills | Status: DC

## 2018-02-23 ENCOUNTER — Encounter (HOSPITAL_COMMUNITY): Payer: Medicaid Other

## 2018-02-23 ENCOUNTER — Telehealth (HOSPITAL_COMMUNITY): Payer: Self-pay | Admitting: Family Medicine

## 2018-02-26 ENCOUNTER — Encounter (HOSPITAL_COMMUNITY)
Admission: RE | Admit: 2018-02-26 | Discharge: 2018-02-26 | Disposition: A | Discharge status: Home / Self Care | Payer: Medicaid Other | Source: Ambulatory Visit | Attending: Cardiology | Admitting: Cardiology

## 2018-02-26 ENCOUNTER — Encounter (HOSPITAL_COMMUNITY): Payer: Medicaid Other

## 2018-02-26 DIAGNOSIS — I110 Hypertensive heart disease with heart failure: Secondary | ICD-10-CM | POA: Diagnosis not present

## 2018-02-26 DIAGNOSIS — I5022 Chronic systolic (congestive) heart failure: Secondary | ICD-10-CM

## 2018-02-26 LAB — GLUCOSE, CAPILLARY
Glucose-Capillary: 88 mg/dL (ref 70–99)
Glucose-Capillary: 124 mg/dL — ABNORMAL HIGH (ref 70–99)

## 2018-02-26 NOTE — Progress Notes (Signed)
CBG 88. No exercise today due to low CBG. Patient was given lemonade and a banana. Recheck CBG 124. Aneka said he CBG was 68 this morning. Mikhia did say she ate breakfast. Patient plans to return to exercise on Wednesday.Barnet Pall, RN,BSN 02/26/2018 12:31 PM

## 2018-02-28 ENCOUNTER — Encounter (HOSPITAL_COMMUNITY): Payer: Medicaid Other

## 2018-02-28 ENCOUNTER — Encounter (HOSPITAL_COMMUNITY)
Admission: RE | Admit: 2018-02-28 | Discharge: 2018-02-28 | Disposition: A | Discharge status: Home / Self Care | Payer: Medicaid Other | Source: Ambulatory Visit | Attending: Cardiology | Admitting: Cardiology

## 2018-02-28 ENCOUNTER — Telehealth: Payer: Self-pay | Admitting: Emergency Medicine

## 2018-02-28 DIAGNOSIS — I5022 Chronic systolic (congestive) heart failure: Secondary | ICD-10-CM

## 2018-02-28 DIAGNOSIS — I110 Hypertensive heart disease with heart failure: Secondary | ICD-10-CM | POA: Diagnosis not present

## 2018-02-28 LAB — GLUCOSE, CAPILLARY: GLUCOSE-CAPILLARY: 109 mg/dL — AB (ref 70–99)

## 2018-02-28 NOTE — Telephone Encounter (Signed)
Please continue the same metformin and Byetta, and: Reduce the NPH insulin to 160 units each morning.

## 2018-02-28 NOTE — Telephone Encounter (Signed)
Pt called and stated since her insulin dosage changed she is having a lot of lows. She is wondering if something else needs to be adjusted or what else to do. Please advise thanks.

## 2018-02-28 NOTE — Telephone Encounter (Signed)
Please advise 

## 2018-02-28 NOTE — Telephone Encounter (Signed)
I called patient & she stated that she would try insulin reduction. She would let us know how cbg's are doing.

## 2018-03-02 ENCOUNTER — Encounter (HOSPITAL_COMMUNITY): Payer: Medicaid Other

## 2018-03-05 ENCOUNTER — Encounter (HOSPITAL_COMMUNITY)
Admission: RE | Admit: 2018-03-05 | Discharge: 2018-03-05 | Disposition: A | Discharge status: Home / Self Care | Payer: Medicaid Other | Source: Ambulatory Visit | Attending: Cardiology | Admitting: Cardiology

## 2018-03-05 ENCOUNTER — Encounter (HOSPITAL_COMMUNITY): Payer: Medicaid Other

## 2018-03-05 DIAGNOSIS — Z7982 Long term (current) use of aspirin: Secondary | ICD-10-CM | POA: Insufficient documentation

## 2018-03-05 DIAGNOSIS — Z7951 Long term (current) use of inhaled steroids: Secondary | ICD-10-CM | POA: Diagnosis not present

## 2018-03-05 DIAGNOSIS — Z803 Family history of malignant neoplasm of breast: Secondary | ICD-10-CM | POA: Insufficient documentation

## 2018-03-05 DIAGNOSIS — K219 Gastro-esophageal reflux disease without esophagitis: Secondary | ICD-10-CM | POA: Insufficient documentation

## 2018-03-05 DIAGNOSIS — Z8041 Family history of malignant neoplasm of ovary: Secondary | ICD-10-CM | POA: Diagnosis not present

## 2018-03-05 DIAGNOSIS — Z791 Long term (current) use of non-steroidal anti-inflammatories (NSAID): Secondary | ICD-10-CM | POA: Insufficient documentation

## 2018-03-05 DIAGNOSIS — I5022 Chronic systolic (congestive) heart failure: Secondary | ICD-10-CM | POA: Diagnosis present

## 2018-03-05 DIAGNOSIS — G4733 Obstructive sleep apnea (adult) (pediatric): Secondary | ICD-10-CM | POA: Diagnosis not present

## 2018-03-05 DIAGNOSIS — Z794 Long term (current) use of insulin: Secondary | ICD-10-CM | POA: Insufficient documentation

## 2018-03-05 DIAGNOSIS — E1143 Type 2 diabetes mellitus with diabetic autonomic (poly)neuropathy: Secondary | ICD-10-CM | POA: Diagnosis not present

## 2018-03-05 DIAGNOSIS — Z79891 Long term (current) use of opiate analgesic: Secondary | ICD-10-CM | POA: Diagnosis not present

## 2018-03-05 DIAGNOSIS — Z79818 Long term (current) use of other agents affecting estrogen receptors and estrogen levels: Secondary | ICD-10-CM | POA: Diagnosis not present

## 2018-03-05 DIAGNOSIS — Z9981 Dependence on supplemental oxygen: Secondary | ICD-10-CM | POA: Insufficient documentation

## 2018-03-05 DIAGNOSIS — Z79899 Other long term (current) drug therapy: Secondary | ICD-10-CM | POA: Diagnosis not present

## 2018-03-05 DIAGNOSIS — F419 Anxiety disorder, unspecified: Secondary | ICD-10-CM | POA: Insufficient documentation

## 2018-03-05 DIAGNOSIS — Z7901 Long term (current) use of anticoagulants: Secondary | ICD-10-CM | POA: Insufficient documentation

## 2018-03-05 DIAGNOSIS — Z7952 Long term (current) use of systemic steroids: Secondary | ICD-10-CM | POA: Diagnosis not present

## 2018-03-05 DIAGNOSIS — Z7983 Long term (current) use of bisphosphonates: Secondary | ICD-10-CM | POA: Insufficient documentation

## 2018-03-05 DIAGNOSIS — N179 Acute kidney failure, unspecified: Secondary | ICD-10-CM | POA: Diagnosis not present

## 2018-03-05 DIAGNOSIS — K3184 Gastroparesis: Secondary | ICD-10-CM | POA: Insufficient documentation

## 2018-03-05 DIAGNOSIS — E7800 Pure hypercholesterolemia, unspecified: Secondary | ICD-10-CM | POA: Diagnosis not present

## 2018-03-05 DIAGNOSIS — Z792 Long term (current) use of antibiotics: Secondary | ICD-10-CM | POA: Insufficient documentation

## 2018-03-05 DIAGNOSIS — M329 Systemic lupus erythematosus, unspecified: Secondary | ICD-10-CM | POA: Diagnosis not present

## 2018-03-05 DIAGNOSIS — Z7902 Long term (current) use of antithrombotics/antiplatelets: Secondary | ICD-10-CM | POA: Diagnosis not present

## 2018-03-05 DIAGNOSIS — I110 Hypertensive heart disease with heart failure: Secondary | ICD-10-CM | POA: Diagnosis not present

## 2018-03-07 ENCOUNTER — Encounter (HOSPITAL_COMMUNITY): Payer: Medicaid Other

## 2018-03-07 ENCOUNTER — Encounter (HOSPITAL_COMMUNITY)
Admission: RE | Admit: 2018-03-07 | Discharge: 2018-03-07 | Disposition: A | Discharge status: Home / Self Care | Payer: Medicaid Other | Source: Ambulatory Visit | Attending: Cardiology | Admitting: Cardiology

## 2018-03-07 DIAGNOSIS — I5022 Chronic systolic (congestive) heart failure: Secondary | ICD-10-CM

## 2018-03-07 DIAGNOSIS — I110 Hypertensive heart disease with heart failure: Secondary | ICD-10-CM | POA: Diagnosis not present

## 2018-03-08 ENCOUNTER — Encounter (HOSPITAL_COMMUNITY): Payer: Self-pay

## 2018-03-08 NOTE — Progress Notes (Signed)
I have reviewed a Home Exercise Prescription with [name] . [name] is currently exercising at home. The patient was advised to continue to bike 2 days a week for 10 minutes; adding a minute until she gets to 30 as tolerated.  Mariann Laster and I discussed how to progress their exercise prescription.The patient stated that they understand the exercise prescription.  We reviewed exercise guidelines, target heart rate during exercise, RPE Scale, weather conditions, NTG use, endpoints for exercise, warmup and cool down. Patient is encouraged to come to me with any questions. I will continue to follow up with the patient to assist them with progression and safety.    Carma Lair MS, ACSM CEP 03/08/2018 3:47 PM

## 2018-03-08 NOTE — Progress Notes (Signed)
Cardiac Individual Treatment Plan  Patient Details  Name: [name] MRN: [id number] Date of Birth: February 18, 1971 Referring Provider:     CARDIAC REHAB PHASE II ORIENTATION from 02/08/2018 in [facility]  Referring Provider  Adrian Prows MD       Initial Encounter Date:    CARDIAC REHAB PHASE II ORIENTATION from 02/08/2018 in [facility]  Date  02/08/18      Visit Diagnosis: Chronic systolic congestive heart failure ([place])  Patient's Home Medications on Admission:  Current Outpatient Medications:  .  ACCU-CHEK AVIVA PLUS test strip, USE TO CHECK BLOOD SUGAR THREE TIMES A DAY, Disp: 300 each, Rfl: 0 .  ACCU-CHEK SOFTCLIX LANCETS lancets, USE TO TEST BLOOD SUGAR 3 TIMES A DAY, Disp: 300 each, Rfl: 0 .  albuterol (PROAIR HFA) 108 (90 BASE) MCG/ACT inhaler, Inhale 2 puffs into the lungs every 6 (six) hours as needed for wheezing or shortness of breath. , Disp: , Rfl:  .  albuterol (PROVENTIL) (2.5 MG/3ML) 0.083% nebulizer solution, Take 2.5 mg by nebulization every 6 (six) hours as needed for wheezing., Disp: , Rfl:  .  alendronate (FOSAMAX) 70 MG tablet, Take 70 mg by mouth every Saturday. Take with a full glass of water on an empty stomach., Disp: , Rfl:  .  ALPRAZolam (XANAX) 0.5 MG tablet, Take 0.5 mg 2 (two) times daily as needed by mouth for anxiety or sleep. , Disp: , Rfl:  .  aspirin EC 81 MG tablet, Take 81 mg by mouth daily., Disp: , Rfl:  .  azaTHIOprine (IMURAN) 50 MG tablet, Take 100 mg by mouth 2 (two) times daily. , Disp: , Rfl:  .  Blood Glucose Monitoring Suppl (ACCU-CHEK AVIVA) device, Use as instructed, Disp: 1 each, Rfl: 0 .  buPROPion (WELLBUTRIN) 100 MG tablet, Take 100 mg by mouth every morning., Disp: , Rfl:  .  carvedilol (COREG) 12.5 MG tablet, Take 3 tablets (37.5 mg total) 2 (two) times daily with a meal by mouth., Disp: 180 tablet, Rfl: 0 .  cetirizine (ZYRTEC) 10 MG tablet, Take 10 mg by mouth daily as  needed for allergies. , Disp: , Rfl:  .  clopidogrel (PLAVIX) 75 MG tablet, Take 75 mg by mouth daily., Disp: , Rfl:  .  clotrimazole-betamethasone (LOTRISONE) cream, Apply 1 application daily as needed topically (itching/breakouts on feet). , Disp: , Rfl:  .  cyclobenzaprine (FLEXERIL) 10 MG tablet, Take 10 mg 2 (two) times daily as needed by mouth. , Disp: , Rfl:  .  cycloSPORINE (RESTASIS) 0.05 % ophthalmic emulsion, Place 1 drop into both eyes 3 (three) times daily., Disp: , Rfl:  .  diclofenac sodium (VOLTAREN) 1 % GEL, Apply 2-4 g 4 (four) times daily as needed topically (joint pain)., Disp: , Rfl:  .  doxepin (SINEQUAN) 50 MG capsule, Take 50-100 mg by mouth at bedtime as needed (for insomnia). , Disp: , Rfl:  .  etonogestrel-ethinyl estradiol (NUVARING) 0.12-0.015 MG/24HR vaginal ring, Place 1 each every 28 (twenty-eight) days vaginally. Insert vaginally and leave in place for 3 consecutive weeks, then remove for 1 week., Disp: , Rfl:  .  exenatide (BYETTA) 10 MCG/0.04ML SOPN injection, Inject 0.04 mLs (10 mcg total) into the skin 2 (two) times daily with a meal. And pen needles 2/day (Patient taking differently: Inject 10 mcg into the skin 2 (two) times daily with a meal. ), Disp: 2.4 mL, Rfl: 11 .  famotidine (PEPCID) 20 MG tablet,  Take 20 mg by mouth 2 (two) times daily., Disp: , Rfl:  .  glucose blood (ACCU-CHEK AVIVA PLUS) test strip, Used to check blood sugar 5x daily., Disp: 450 each, Rfl: 2 .  glucose blood (ACCU-CHEK GUIDE) test strip, Used to check blood sugars 5x daily., Disp: 200 each, Rfl: 12 .  hydrALAZINE (APRESOLINE) 50 MG tablet, Take 50 mg by mouth 3 (three) times daily., Disp: , Rfl:  .  hydrochlorothiazide (HYDRODIURIL) 25 MG tablet, Take 25 mg by mouth daily., Disp: , Rfl:  .  hydroxychloroquine (PLAQUENIL) 200 MG tablet, Take 200 mg by mouth 2 (two) times daily. , Disp: , Rfl:  .  insulin NPH Human (HUMULIN N) 100 UNIT/ML injection, Inject 2 mLs (200 Units total) into the  skin every morning. And syringes 3/day, Disp: 70 mL, Rfl: 11 .  Insulin Pen Needle 32G X 4 MM MISC, Used to inject insulin 1x daily., Disp: 100 each, Rfl: 5 .  Insulin Syringe-Needle U-100 (COMFORT ASSIST INSULIN SYRINGE) 31G X 5/16" 1 ML MISC, Use one daily with insulin, Disp: 90 each, Rfl: 4 .  Lancets (ACCU-CHEK MULTICLIX) lancets, Used to check blood sugars daily up to 5 times., Disp: 204 each, Rfl: 12 .  lidocaine (LIDODERM) 5 %, Place 1 patch onto the skin daily. Remove & Discard patch within 12 hours or as directed by MD (Patient not taking: Reported on 09/21/2017), Disp: 30 patch, Rfl: 0 .  magnesium oxide (MAG-OX) 400 MG tablet, Take 400 mg by mouth daily., Disp: , Rfl:  .  megestrol (MEGACE) 40 MG tablet, Take 40 mg by mouth daily., Disp: , Rfl: 3 .  metFORMIN (GLUCOPHAGE-XR) 500 MG 24 hr tablet, Take 1,000 mg by mouth 2 (two) times daily., Disp: , Rfl:  .  mometasone-formoterol (DULERA) 100-5 MCG/ACT AERO, Inhale 2 puffs into the lungs 2 (two) times daily., Disp: , Rfl:  .  morphine (MSIR) 15 MG tablet, Take 1 tablet (15 mg total) every 6 (six) hours as needed by mouth for severe pain. (Patient not taking: Reported on 09/21/2017), Disp: 6 tablet, Rfl: 0 .  olmesartan (BENICAR) 40 MG tablet, Take 40 mg by mouth daily., Disp: , Rfl:  .  Olopatadine HCl (PAZEO) 0.7 % SOLN, Place 1 drop daily as needed into both eyes (itching/irritation). Pazeo, Disp: , Rfl:  .  oxybutynin (DITROPAN-XL) 10 MG 24 hr tablet, Take 10 mg by mouth daily., Disp: , Rfl:  .  Polyvinyl Alcohol-Povidone (REFRESH OP), Place 1 drop 2 (two) times daily into both eyes. , Disp: , Rfl:  .  potassium chloride SA (K-DUR,KLOR-CON) 20 MEQ tablet, Take 20 mEq by mouth daily. , Disp: , Rfl:  .  pravastatin (PRAVACHOL) 10 MG tablet, Take 10 mg by mouth daily., Disp: , Rfl:  .  predniSONE (DELTASONE) 1 MG tablet, Take 3 mg by mouth daily. , Disp: , Rfl:  .  pregabalin (LYRICA) 75 MG capsule, Take 75 mg 3 (three) times daily by mouth.  , Disp: , Rfl:  .  sacubitril-valsartan (ENTRESTO) 97-103 MG, Take 1 tablet by mouth 2 (two) times daily., Disp: , Rfl:  .  topiramate (TOPAMAX) 25 MG tablet, Take 75 mg by mouth at bedtime., Disp: , Rfl:  .  torsemide (DEMADEX) 20 MG tablet, 40 mg in AM and 20 mg in PM (Patient taking differently: 20 mg daily. ), Disp: 30 tablet, Rfl: 0 .  traMADol (ULTRAM) 50 MG tablet, TAKE 1 TO 2 TABLETS BY MOUTH EVERY 6 HOURS AS NEEDED FOR  PAIN**PA REQ**, Disp: 60 tablet, Rfl: 0  Past Medical History: Past Medical History:  Diagnosis Date  . Acute renal failure (HCC)     Intractable nausea vomiting secondary to diabetic gastroparesis causing dehydration and acute renal failure /notes 04/01/2013  . Anginal pain ([place])   . Anxiety   . Bell's palsy 08/09/2010  . CAP (community acquired pneumonia) 10/2011   Archie Endo 11/08/2011  . Chest pain 01/13/2014  . CHF (congestive heart failure) ([place])   . Diabetic gastroparesis associated with type 2 diabetes mellitus ([place])    this is presumed diagnoses, not confirmed by any studies.   . Eczema   . Family history of malignant neoplasm of breast   . Family history of malignant neoplasm of ovary   . GERD (gastroesophageal reflux disease)   . High cholesterol   . Hypertension   . Liver mass 2014   biopsied 03/2013 at [facility], not malignant.  is to undergo radiologic ablation of the mass in sept/October 2014.   . Lupus ([place])   . Migraines    "maybe a couple times/yr" (04/01/2013)  . Obesity   . Obstructive sleep apnea on CPAP 2011  . On home oxygen therapy    "2L 24/7" (02/28/2015)  . SJOGREN'S SYNDROME 08/09/2010  . SLE (systemic lupus erythematosus) ([place])    Archie Endo 12/01/2010  . Stroke [facility]) 2010; 10/2012   "left side is still weak from it, never fully regained full strength; no additions from stroke 10/2012"    Tobacco Use: Social History   Tobacco Use  Smoking Status Former Smoker  . Years: 3.00  . Types: Cigarettes  . Last attempt to quit: 06/01/1990  .  Years since quitting: 27.7  Smokeless Tobacco Never Used    Labs: Recent Chemical engineer    Labs for ITP Cardiac and Pulmonary Rehab Latest Ref Rng & Units 09/11/2017 09/20/2017 09/20/2017 11/09/2017 02/21/2018   Cholestrol 0 - 200 mg/dL - - - - -   LDLCALC 0 - 99 mg/dL - - - - -   HDL >39 mg/dL - - - - -   Trlycerides <150 mg/dL - - - - -   Hemoglobin A1c 4.0 - 5.6 % 9.8 - - 6.9 6.4(A)   PHART 7.350 - 7.450 - - 7.324(L) - -   PCO2ART 32.0 - 48.0 mmHg - - 45.4 - -   HCO3 20.0 - 28.0 mmol/L - - 23.6 - -   TCO2 22 - 32 mmol/L - 24 25 - -   ACIDBASEDEF 0.0 - 2.0 mmol/L - - 3.0(H) - -   O2SAT % - - 99.0 - -      Capillary Blood Glucose: Lab Results  Component Value Date   GLUCAP 109 (H) 02/28/2018   GLUCAP 124 (H) 02/26/2018   GLUCAP 88 02/26/2018   GLUCAP 129 (H) 02/16/2018   GLUCAP 186 (H) 02/16/2018     Exercise Target Goals:    Exercise Program Goal: Individual exercise prescription set using results from initial 6 min walk test and THRR while considering  patient's activity barriers and safety.   Exercise Prescription Goal: Initial exercise prescription builds to 30-45 minutes a day of aerobic activity, 2-3 days per week.  Home exercise guidelines will be given to patient during program as part of exercise prescription that the participant will acknowledge.  Activity Barriers & Risk Stratification: Activity Barriers & Cardiac Risk Stratification - 02/08/18 0931      Activity Barriers & Cardiac Risk Stratification   Activity Barriers  Arthritis;Muscular Weakness;Deconditioning;Joint Problems;Shortness of Breath;Back Problems;Other (comment)    Comments  Lupus flare up    Cardiac Risk Stratification  High       6 Minute Walk: 6 Minute Walk    Row Name 02/08/18 1228         6 Minute Walk   Phase  Initial     Distance  498 feet     Walk Time  6 minutes     # of Rest Breaks  0     MPH  0.94     METS  1.14     RPE  14     Perceived Dyspnea   2     VO2  Peak  4.01     Symptoms  Yes (comment)     Comments  Moderate SOB +2, Took break for 1 min and 29 secs due to SOB      Resting HR  85 bpm     Resting BP  120/80     Resting Oxygen Saturation   100 %     Exercise Oxygen Saturation  during 6 min walk  100 %     Max Ex. HR  120 bpm     Max Ex. BP  140/80     2 Minute Post BP  110/70        Oxygen Initial Assessment:   Oxygen Re-Evaluation:   Oxygen Discharge (Final Oxygen Re-Evaluation):   Initial Exercise Prescription: Initial Exercise Prescription - 02/08/18 1200      Date of Initial Exercise RX and Referring Provider   Date  02/08/18    Referring Provider  Adrian Prows MD     Expected Discharge Date  05/13/18      NuStep   Level  2    SPM  75    Minutes  10    METs  1.2      Arm Ergometer   Level  2    Watts  25    Minutes  10    METs  2      Track   Laps  4    Minutes  10    METs  0.9      Prescription Details   Frequency (times per week)  3x    Duration  Progress to 30 minutes of continuous aerobic without signs/symptoms of physical distress      Intensity   THRR 40-80% of Max Heartrate  67-138    Ratings of Perceived Exertion  11-13    Perceived Dyspnea  0-4      Progression   Progression  Continue progressive overload as per policy without signs/symptoms or physical distress.      Resistance Training   Training Prescription  Yes    Weight  4lbs    Reps  10-15       Perform Capillary Blood Glucose checks as needed.  Exercise Prescription Changes: Exercise Prescription Changes    Row Name 02/14/18 1454 03/07/18 1538           Response to Exercise   Blood Pressure (Admit)  120/84  147/97      Blood Pressure (Exercise)  142/78  128/82      Blood Pressure (Exit)  118/78  122/72      Heart Rate (Admit)  96 bpm  85 bpm      Heart Rate (Exercise)  131 bpm  106 bpm      Heart Rate (Exit)  96 bpm  80  bpm      Rating of Perceived Exertion (Exercise)  12  11      Perceived Dyspnea (Exercise)  0   0      Symptoms  None   None       Comments  Pt oriented to exercise equipment   -      Duration  Progress to 30 minutes of  aerobic without signs/symptoms of physical distress  Continue with 30 min of aerobic exercise without signs/symptoms of physical distress.      Intensity  THRR New  THRR unchanged        Progression   Progression  Continue to progress workloads to maintain intensity without signs/symptoms of physical distress.  Continue to progress workloads to maintain intensity without signs/symptoms of physical distress.      Average METs  2  1.9        Resistance Training   Training Prescription  No  Yes      Weight  -  4lbs      Reps  -  10-15      Time  -  10 Minutes        Interval Training   Interval Training  No  No        NuStep   Level  2  2      SPM  75  75      Minutes  10  10      METs  1.9  1.9        Arm Ergometer   Level  -  3      Watts  -  25      Minutes  -  10      METs  -  1.8        Track   Laps  4  5      Minutes  10  10      METs  0.9  1.84        Home Exercise Plan   Plans to continue exercise at  -  Home (comment) Stationary Bike @ Home       Frequency  -  Add 2 additional days to program exercise sessions.      Initial Home Exercises Provided  -  03/07/18         Exercise Comments: Exercise Comments    Row Name 02/14/18 1455 03/08/18 1537         Exercise Comments  Pt's first day of exercise. Pt responded well to exercise prescription. Will continue to work with pt to increase workloads as tolerated.   Reviewed HEP with pt. Pt has stationary bike at home. Pt will gradually build her stamina to work up to 30 minutes. Will continue to monitor pt and follow up regarding HEP.          Exercise Goals and Review: Exercise Goals    Row Name 02/08/18 0934             Exercise Goals   Increase Physical Activity  Yes       Intervention  Provide advice, education, support and counseling about physical activity/exercise  needs.;Develop an individualized exercise prescription for aerobic and resistive training based on initial evaluation findings, risk stratification, comorbidities and participant's personal goals.       Expected Outcomes  Short Term: Attend rehab on a regular basis to increase amount of physical activity.;Long Term: Add in home exercise to make exercise part of routine  and to increase amount of physical activity.;Long Term: Exercising regularly at least 3-5 days a week.       Increase Strength and Stamina  Yes       Intervention  Provide advice, education, support and counseling about physical activity/exercise needs.;Develop an individualized exercise prescription for aerobic and resistive training based on initial evaluation findings, risk stratification, comorbidities and participant's personal goals.       Expected Outcomes  Short Term: Increase workloads from initial exercise prescription for resistance, speed, and METs.;Short Term: Perform resistance training exercises routinely during rehab and add in resistance training at home;Long Term: Improve cardiorespiratory fitness, muscular endurance and strength as measured by increased METs and functional capacity (6MWT)       Able to understand and use rate of perceived exertion (RPE) scale  Yes       Intervention  Provide education and explanation on how to use RPE scale       Expected Outcomes  Short Term: Able to use RPE daily in rehab to express subjective intensity level;Long Term:  Able to use RPE to guide intensity level when exercising independently       Knowledge and understanding of Target Heart Rate Range (THRR)  Yes       Intervention  Provide education and explanation of THRR including how the numbers were predicted and where they are located for reference       Expected Outcomes  Short Term: Able to state/look up THRR;Long Term: Able to use THRR to govern intensity when exercising independently;Short Term: Able to use daily as guideline  for intensity in rehab       Able to check pulse independently  Yes       Intervention  Provide education and demonstration on how to check pulse in carotid and radial arteries.;Review the importance of being able to check your own pulse for safety during independent exercise       Expected Outcomes  Short Term: Able to explain why pulse checking is important during independent exercise;Long Term: Able to check pulse independently and accurately       Understanding of Exercise Prescription  Yes       Intervention  Provide education, explanation, and written materials on patient's individual exercise prescription       Expected Outcomes  Short Term: Able to explain program exercise prescription;Long Term: Able to explain home exercise prescription to exercise independently          Exercise Goals Re-Evaluation : Exercise Goals Re-Evaluation    [place] Name 03/08/18 1540             Exercise Goal Re-Evaluation   Exercise Goals Review  Increase Physical Activity;Increase Strength and Stamina;Able to understand and use rate of perceived exertion (RPE) scale;Knowledge and understanding of Target Heart Rate Range (THRR);Able to check pulse independently;Understanding of Exercise Prescription       Comments  Reviewed HEP with pt. Reviewed THRR, RPE Scale, weather conditions, endpoints of exercise, NTG use, warmup and cool down.       Expected Outcomes  Pt will continue to exercise using stationary bike 2 days a week for 10 minutes. Pt will gradually add a minute, until she gets to 30 minutes. Pt will continue to increase stamina and strength. Will continue to monitor and progress pt.            Discharge Exercise Prescription (Final Exercise Prescription Changes): Exercise Prescription Changes - 03/07/18 1538      Response to Exercise  Blood Pressure (Admit)  147/97    Blood Pressure (Exercise)  128/82    Blood Pressure (Exit)  122/72    Heart Rate (Admit)  85 bpm    Heart Rate (Exercise)   106 bpm    Heart Rate (Exit)  80 bpm    Rating of Perceived Exertion (Exercise)  11    Perceived Dyspnea (Exercise)  0    Symptoms  None     Duration  Continue with 30 min of aerobic exercise without signs/symptoms of physical distress.    Intensity  THRR unchanged      Progression   Progression  Continue to progress workloads to maintain intensity without signs/symptoms of physical distress.    Average METs  1.9      Resistance Training   Training Prescription  Yes    Weight  4lbs    Reps  10-15    Time  10 Minutes      Interval Training   Interval Training  No      NuStep   Level  2    SPM  75    Minutes  10    METs  1.9      Arm Ergometer   Level  3    Watts  25    Minutes  10    METs  1.8      Track   Laps  5    Minutes  10    METs  1.84      Home Exercise Plan   Plans to continue exercise at  Home (comment)   Stationary Bike @ Home    Frequency  Add 2 additional days to program exercise sessions.    Initial Home Exercises Provided  03/07/18       Nutrition:  Target Goals: Understanding of nutrition guidelines, daily intake of sodium 1500mg , cholesterol 200mg , calories 30% from fat and 7% or less from saturated fats, daily to have 5 or more servings of fruits and vegetables.  Biometrics: Pre Biometrics - 02/08/18 1232      Pre Biometrics   Height  5' 8.75" (1.746 m)    Weight  (!) 179.8 kg    Waist Circumference  62.25 inches    Hip Circumference  64 inches    Waist to Hip Ratio  0.97 %    BMI (Calculated)  58.98    Triceps Skinfold  66 mm    % Body Fat  67.7 %    Grip Strength  34 kg    Flexibility  9 in    Single Leg Stand  4 seconds        Nutrition Therapy Plan and Nutrition Goals: Nutrition Therapy & Goals - 02/08/18 1351      Nutrition Therapy   Diet  consistent carbohydrate heart healthy      Personal Nutrition Goals   Nutrition Goal  Pt to identify food quantities necessary to achieve weight loss of 6-24 lbs. at graduation from  cardiac rehab. Goal wt loss of 20 lb desired.     Personal Goal #2  Pt to identify and limit food sources of saturated fat, trans fat, and sodium      Intervention Plan   Intervention  Prescribe, educate and counsel regarding individualized specific dietary modifications aiming towards targeted core components such as weight, hypertension, lipid management, diabetes, heart failure and other comorbidities.    Expected Outcomes  Short Term Goal: Understand basic principles of dietary content, such as calories, fat, sodium,  cholesterol and nutrients.       Nutrition Assessments: Nutrition Assessments - 02/08/18 1352      MEDFICTS Scores   Pre Score  44       Nutrition Goals Re-Evaluation: Nutrition Goals Re-Evaluation    Row Name 02/08/18 1351             Goals   Current Weight  396 lb 6.2 oz (179.8 kg)          Nutrition Goals Re-Evaluation: Nutrition Goals Re-Evaluation    [place] Name 02/08/18 1351             Goals   Current Weight  396 lb 6.2 oz (179.8 kg)          Nutrition Goals Discharge (Final Nutrition Goals Re-Evaluation): Nutrition Goals Re-Evaluation - 02/08/18 1351      Goals   Current Weight  396 lb 6.2 oz (179.8 kg)       Psychosocial: Target Goals: Acknowledge presence or absence of significant depression and/or stress, maximize coping skills, provide positive support system. Participant is able to verbalize types and ability to use techniques and skills needed for reducing stress and depression.  Initial Review & Psychosocial Screening: Initial Psych Review & Screening - 02/08/18 0918      Initial Review   Current issues with  Current Depression      Family Dynamics   Good Support System?  No   no self identified support system   Concerns  No support system;Recent loss of significant other   mother and sister    Comments  pt currently working with personal therapist, Helene Kelp at [facility]; pt also socially isolatd due to physical  and emotional limitations      Barriers   Psychosocial barriers to participate in program  Psychosocial barriers identified (see note)   self social isolation from depression and physical barriers, pt faith altered during grief process      Screening Interventions   Interventions  Encouraged to exercise;Program counselor consult;To provide support and resources with identified psychosocial needs;Provide feedback about the scores to participant    Expected Outcomes  Short Term goal: Utilizing psychosocial counselor, staff and physician to assist with identification of specific Stressors or current issues interfering with healing process. Setting desired goal for each stressor or current issue identified.;Long Term Goal: Stressors or current issues are controlled or eliminated.;Short Term goal: Identification and review with participant of any Quality of Life or Depression concerns found by scoring the questionnaire.;Long Term goal: The participant improves quality of Life and PHQ9 Scores as seen by post scores and/or verbalization of changes       Quality of Life Scores: Quality of Life - 02/08/18 0919      Quality of Life   Select  Quality of Life      Quality of Life Scores   Health/Function Pre  14.63 %    Socioeconomic Pre  16.06 %    Psych/Spiritual Pre  2.57 %    Family Pre  20.4 %    GLOBAL Pre  13.29 %      Scores of 19 and below usually indicate a poorer quality of life in these areas.  A difference of  2-3 points is a clinically meaningful difference.  A difference of 2-3 points in the total score of the Quality of Life Index has been associated with significant improvement in overall quality of life, self-image, physical symptoms, and general health in studies assessing change in quality of  life.  PHQ-9: Recent Review Flowsheet Data    Depression screen PHQ 2/9 02/14/2018   Decreased Interest 1   Down, Depressed, Hopeless 1   PHQ - 2 Score 2     Interpretation of Total  Score  Total Score Depression Severity:  1-4 = Minimal depression, 5-9 = Mild depression, 10-14 = Moderate depression, 15-19 = Moderately severe depression, 20-27 = Severe depression   Psychosocial Evaluation and Intervention: Psychosocial Evaluation - 02/14/18 1422      Psychosocial Evaluation & Interventions   Interventions  Therapist referral;Encouraged to exercise with the program and follow exercise prescription;Stress management education;Relaxation education   Pt currently meets with therapist, Nanette Thunderburke.   Comments  Karnisha has current depression. She is not happy with her health and general appearance.  She has also suffered loss of her support system. She meets with her therapists and feels that this is helpful. She has been socially isolated due to depression and physical barriers but is starting to do things with her neighbor. Previously Vickki enjoyed traveling and going to museums.     Expected Outcomes  Noor will become less isolated and see herself in a more positive manner.    Continue Psychosocial Services   Follow up required by staff       Psychosocial Re-Evaluation: Psychosocial Re-Evaluation    Buckman Name 03/08/18 1147             Psychosocial Re-Evaluation   Current issues with  Current Depression       Comments  Adellyn enjoys coming to CR.  She continues to meet with her therapist.  No interventions necessary at this time.       Expected Outcomes  Corisa will exhibit a positive outlook with good coping skills.       Interventions  Encouraged to attend Cardiac Rehabilitation for the exercise;Stress management education;Relaxation education       Continue Psychosocial Services   Follow up required by staff          Psychosocial Discharge (Final Psychosocial Re-Evaluation): Psychosocial Re-Evaluation - 03/08/18 1147      Psychosocial Re-Evaluation   Current issues with  Current Depression    Comments  Markeshia enjoys coming to CR.  She continues to meet  with her therapist.  No interventions necessary at this time.    Expected Outcomes  Noreen will exhibit a positive outlook with good coping skills.    Interventions  Encouraged to attend Cardiac Rehabilitation for the exercise;Stress management education;Relaxation education    Continue Psychosocial Services   Follow up required by staff       Vocational Rehabilitation: Provide vocational rehab assistance to qualifying candidates.   Vocational Rehab Evaluation & Intervention: Vocational Rehab - 02/08/18 0918      Initial Vocational Rehab Evaluation & Intervention   Assessment shows need for Vocational Rehabilitation  No   medically disabled       Education: Education Goals: Education classes will be provided on a weekly basis, covering required topics. Participant will state understanding/return demonstration of topics presented.  Learning Barriers/Preferences: Learning Barriers/Preferences - 02/08/18 0930      Learning Barriers/Preferences   Learning Barriers  Sight    Learning Preferences  Video;Pictoral;Written Material       Education Topics: Count Your Pulse:  -Group instruction provided by verbal instruction, demonstration, patient participation and written materials to support subject.  Instructors address importance of being able to find your pulse and how to count your pulse when at home without  a heart monitor.  Patients get hands on experience counting their pulse with staff help and individually.   Heart Attack, Angina, and Risk Factor Modification:  -Group instruction provided by verbal instruction, video, and written materials to support subject.  Instructors address signs and symptoms of angina and heart attacks.    Also discuss risk factors for heart disease and how to make changes to improve heart health risk factors.   CARDIAC REHAB PHASE II EXERCISE from 03/07/2018 in [place]  Date  03/07/18  Instruction Review Code  2-  Demonstrated Understanding      Functional Fitness:  -Group instruction provided by verbal instruction, demonstration, patient participation, and written materials to support subject.  Instructors address safety measures for doing things around the house.  Discuss how to get up and down off the floor, how to pick things up properly, how to safely get out of a chair without assistance, and balance training.   Meditation and Mindfulness:  -Group instruction provided by verbal instruction, patient participation, and written materials to support subject.  Instructor addresses importance of mindfulness and meditation practice to help reduce stress and improve awareness.  Instructor also leads participants through a meditation exercise.    Stretching for Flexibility and Mobility:  -Group instruction provided by verbal instruction, patient participation, and written materials to support subject.  Instructors lead participants through series of stretches that are designed to increase flexibility thus improving mobility.  These stretches are additional exercise for major muscle groups that are typically performed during regular warm up and cool down.   Hands Only CPR:  -Group verbal, video, and participation provides a basic overview of AHA guidelines for community CPR. Role-play of emergencies allow participants the opportunity to practice calling for help and chest compression technique with discussion of AED use.   Hypertension: -Group verbal and written instruction that provides a basic overview of hypertension including the most recent diagnostic guidelines, risk factor reduction with self-care instructions and medication management.    Nutrition I class: Heart Healthy Eating:  -Group instruction provided by PowerPoint slides, verbal discussion, and written materials to support subject matter. The instructor gives an explanation and review of the Therapeutic Lifestyle Changes diet  recommendations, which includes a discussion on lipid goals, dietary fat, sodium, fiber, plant stanol/sterol esters, sugar, and the components of a well-balanced, healthy diet.   Nutrition II class: Lifestyle Skills:  -Group instruction provided by PowerPoint slides, verbal discussion, and written materials to support subject matter. The instructor gives an explanation and review of label reading, grocery shopping for heart health, heart healthy recipe modifications, and ways to make healthier choices when eating out.   Diabetes Question & Answer:  -Group instruction provided by PowerPoint slides, verbal discussion, and written materials to support subject matter. The instructor gives an explanation and review of diabetes co-morbidities, pre- and post-prandial blood glucose goals, pre-exercise blood glucose goals, signs, symptoms, and treatment of hypoglycemia and hyperglycemia, and foot care basics.   Diabetes Blitz:  -Group instruction provided by PowerPoint slides, verbal discussion, and written materials to support subject matter. The instructor gives an explanation and review of the physiology behind type 1 and type 2 diabetes, diabetes medications and rational behind using different medications, pre- and post-prandial blood glucose recommendations and Hemoglobin A1c goals, diabetes diet, and exercise including blood glucose guidelines for exercising safely.    Portion Distortion:  -Group instruction provided by PowerPoint slides, verbal discussion, written materials, and food models to support subject matter.  The instructor gives an explanation of serving size versus portion size, changes in portions sizes over the last 20 years, and what consists of a serving from each food group.   Stress Management:  -Group instruction provided by verbal instruction, video, and written materials to support subject matter.  Instructors review role of stress in heart disease and how to cope with stress  positively.     Exercising on Your Own:  -Group instruction provided by verbal instruction, power point, and written materials to support subject.  Instructors discuss benefits of exercise, components of exercise, frequency and intensity of exercise, and end points for exercise.  Also discuss use of nitroglycerin and activating EMS.  Review options of places to exercise outside of rehab.  Review guidelines for sex with heart disease.   Cardiac Drugs I:  -Group instruction provided by verbal instruction and written materials to support subject.  Instructor reviews cardiac drug classes: antiplatelets, anticoagulants, beta blockers, and statins.  Instructor discusses reasons, side effects, and lifestyle considerations for each drug class.   Cardiac Drugs II:  -Group instruction provided by verbal instruction and written materials to support subject.  Instructor reviews cardiac drug classes: angiotensin converting enzyme inhibitors (ACE-I), angiotensin II receptor blockers (ARBs), nitrates, and calcium channel blockers.  Instructor discusses reasons, side effects, and lifestyle considerations for each drug class.   Anatomy and Physiology of the Circulatory System:  Group verbal and written instruction and models provide basic cardiac anatomy and physiology, with the coronary electrical and arterial systems. Review of: AMI, Angina, Valve disease, Heart Failure, Peripheral Artery Disease, Cardiac Arrhythmia, Pacemakers, and the ICD.   Other Education:  -Group or individual verbal, written, or video instructions that support the educational goals of the cardiac rehab program.   Holiday Eating Survival Tips:  -Group instruction provided by PowerPoint slides, verbal discussion, and written materials to support subject matter. The instructor gives patients tips, tricks, and techniques to help them not only survive but enjoy the holidays despite the onslaught of food that accompanies the  holidays.   Knowledge Questionnaire Score: Knowledge Questionnaire Score - 02/08/18 0918      Knowledge Questionnaire Score   Pre Score  14/24       Core Components/Risk Factors/Patient Goals at Admission: Personal Goals and Risk Factors at Admission - 02/08/18 1227      Core Components/Risk Factors/Patient Goals on Admission    Weight Management  Yes;Obesity;Weight Loss    Intervention  Weight Management: Develop a combined nutrition and exercise program designed to reach desired caloric intake, while maintaining appropriate intake of nutrient and fiber, sodium and fats, and appropriate energy expenditure required for the weight goal.;Weight Management: Provide education and appropriate resources to help participant work on and attain dietary goals.;Weight Management/Obesity: Establish reasonable short term and long term weight goals.;Obesity: Provide education and appropriate resources to help participant work on and attain dietary goals.    Admit Weight  396 lb 6.2 oz (179.8 kg)    Goal Weight: Short Term  385 lb (174.6 kg)    Goal Weight: Long Term  376 lb (170.6 kg)    Expected Outcomes  Short Term: Continue to assess and modify interventions until short term weight is achieved;Long Term: Adherence to nutrition and physical activity/exercise program aimed toward attainment of established weight goal;Weight Loss: Understanding of general recommendations for a balanced deficit meal plan, which promotes 1-2 lb weight loss per week and includes a negative energy balance of [id number] kcal/d;Understanding recommendations for meals to include 15-35%  energy as protein, 25-35% energy from fat, 35-60% energy from carbohydrates, less than 200mg  of dietary cholesterol, 20-35 gm of total fiber daily;Understanding of distribution of calorie intake throughout the day with the consumption of 4-5 meals/snacks    Diabetes  Yes    Intervention  Provide education about signs/symptoms and action to take for  hypo/hyperglycemia.;Provide education about proper nutrition, including hydration, and aerobic/resistive exercise prescription along with prescribed medications to achieve blood glucose in normal ranges: Fasting glucose 65-99 mg/dL    Expected Outcomes  Short Term: Participant verbalizes understanding of the signs/symptoms and immediate care of hyper/hypoglycemia, proper foot care and importance of medication, aerobic/resistive exercise and nutrition plan for blood glucose control.;Long Term: Attainment of HbA1C < 7%.    Heart Failure  Yes    Intervention  Provide a combined exercise and nutrition program that is supplemented with education, support and counseling about heart failure. Directed toward relieving symptoms such as shortness of breath, decreased exercise tolerance, and extremity edema.    Expected Outcomes  Improve functional capacity of life;Short term: Attendance in program 2-3 days a week with increased exercise capacity. Reported lower sodium intake. Reported increased fruit and vegetable intake. Reports medication compliance.;Short term: Daily weights obtained and reported for increase. Utilizing diuretic protocols set by physician.;Long term: Adoption of self-care skills and reduction of barriers for early signs and symptoms recognition and intervention leading to self-care maintenance.    Hypertension  Yes    Intervention  Monitor prescription use compliance.;Provide education on lifestyle modifcations including regular physical activity/exercise, weight management, moderate sodium restriction and increased consumption of fresh fruit, vegetables, and low fat dairy, alcohol moderation, and smoking cessation.    Expected Outcomes  Short Term: Continued assessment and intervention until BP is < 140/46mm HG in hypertensive participants. < 130/11mm HG in hypertensive participants with diabetes, heart failure or chronic kidney disease.;Long Term: Maintenance of blood pressure at goal levels.     Lipids  Yes    Intervention  Provide education and support for participant on nutrition & aerobic/resistive exercise along with prescribed medications to achieve LDL 70mg , HDL >40mg .    Expected Outcomes  Short Term: Participant states understanding of desired cholesterol values and is compliant with medications prescribed. Participant is following exercise prescription and nutrition guidelines.;Long Term: Cholesterol controlled with medications as prescribed, with individualized exercise RX and with personalized nutrition plan. Value goals: LDL < 70mg , HDL > 40 mg.       Core Components/Risk Factors/Patient Goals Review:  Goals and Risk Factor Review    Row Name 02/14/18 1418 03/08/18 1148           Core Components/Risk Factors/Patient Goals Review   Personal Goals Review  Weight Management/Obesity;Lipids;Hypertension;Diabetes;Heart Failure  Weight Management/Obesity;Lipids;Hypertension;Diabetes;Heart Failure      Review  Pt with multiple CAD RFs willing to participate in CR exercise.  Ellouise would like to build her heart strength and increase her energy/stamina.  Pt with multiple CAD RFs willing to participate in CR exercise.  Clair is working towards increasing her strength.  She was having issues with maintaining a CBG acceptable for exercise.  Medication adjustments were made and her CBG has been adequate thus far.  Mandi's weight has been fluctuating as well.  She uses a monitored scale at home that informs her MDs of this for appropriate management.      Expected Outcomes  Selita will participate in CR exericise, nutrition, and lifestlye modification opportunities.   Kiari will participate in CR exericise, nutrition, and lifestlye  modification opportunities. CBG will remain adequate for exercise. Christinamarie will continue to weigh herself everyday.          Core Components/Risk Factors/Patient Goals at Discharge (Final Review):  Goals and Risk Factor Review - 03/08/18 1148      Core  Components/Risk Factors/Patient Goals Review   Personal Goals Review  Weight Management/Obesity;Lipids;Hypertension;Diabetes;Heart Failure    Review  Pt with multiple CAD RFs willing to participate in CR exercise.  Mabrey is working towards increasing her strength.  She was having issues with maintaining a CBG acceptable for exercise.  Medication adjustments were made and her CBG has been adequate thus far.  Dazaria's weight has been fluctuating as well.  She uses a monitored scale at home that informs her MDs of this for appropriate management.    Expected Outcomes  Mikiala will participate in CR exericise, nutrition, and lifestlye modification opportunities. CBG will remain adequate for exercise. Michaela will continue to weigh herself everyday.        ITP Comments: ITP Comments    Row Name 02/07/18 1536 02/14/18 1417 03/08/18 1145       ITP Comments  Dr. Fransico Him, Medical Director  Mariann Laster started exercise today and tolerated it well.  30 Day ITP Review. Lecretia has been doing well with exercise.  She has missed some sessions due to sickness and low CBGs.         Comments: See ITP Comments.

## 2018-03-09 ENCOUNTER — Encounter (HOSPITAL_COMMUNITY): Payer: Medicaid Other

## 2018-03-09 ENCOUNTER — Encounter (HOSPITAL_COMMUNITY)
Admission: RE | Admit: 2018-03-09 | Discharge: 2018-03-09 | Disposition: A | Discharge status: Home / Self Care | Payer: Medicaid Other | Source: Ambulatory Visit | Attending: Cardiology | Admitting: Cardiology

## 2018-03-09 DIAGNOSIS — I110 Hypertensive heart disease with heart failure: Secondary | ICD-10-CM | POA: Diagnosis not present

## 2018-03-09 DIAGNOSIS — I5022 Chronic systolic (congestive) heart failure: Secondary | ICD-10-CM

## 2018-03-12 ENCOUNTER — Telehealth (HOSPITAL_COMMUNITY): Payer: Self-pay | Admitting: Family Medicine

## 2018-03-12 ENCOUNTER — Encounter (HOSPITAL_COMMUNITY): Payer: Medicaid Other

## 2018-03-14 ENCOUNTER — Encounter (HOSPITAL_COMMUNITY): Payer: Medicaid Other

## 2018-03-14 ENCOUNTER — Encounter (HOSPITAL_COMMUNITY)
Admission: RE | Admit: 2018-03-14 | Discharge: 2018-03-14 | Disposition: A | Discharge status: Home / Self Care | Payer: Medicaid Other | Source: Ambulatory Visit | Attending: Cardiology | Admitting: Cardiology

## 2018-03-14 DIAGNOSIS — I5022 Chronic systolic (congestive) heart failure: Secondary | ICD-10-CM

## 2018-03-14 DIAGNOSIS — I110 Hypertensive heart disease with heart failure: Secondary | ICD-10-CM | POA: Diagnosis not present

## 2018-03-14 LAB — GLUCOSE, CAPILLARY: Glucose-Capillary: 146 mg/dL — ABNORMAL HIGH (ref 70–99)

## 2018-03-16 ENCOUNTER — Encounter (HOSPITAL_COMMUNITY)
Admission: RE | Admit: 2018-03-16 | Discharge: 2018-03-16 | Disposition: A | Discharge status: Home / Self Care | Payer: Medicaid Other | Source: Ambulatory Visit | Attending: Cardiology | Admitting: Cardiology

## 2018-03-16 ENCOUNTER — Encounter (HOSPITAL_COMMUNITY): Payer: Medicaid Other

## 2018-03-16 DIAGNOSIS — I5022 Chronic systolic (congestive) heart failure: Secondary | ICD-10-CM

## 2018-03-16 DIAGNOSIS — I110 Hypertensive heart disease with heart failure: Secondary | ICD-10-CM | POA: Diagnosis not present

## 2018-03-19 ENCOUNTER — Encounter (HOSPITAL_COMMUNITY): Payer: Medicaid Other

## 2018-03-19 ENCOUNTER — Encounter (HOSPITAL_COMMUNITY)
Admission: RE | Admit: 2018-03-19 | Discharge: 2018-03-19 | Disposition: A | Discharge status: Home / Self Care | Payer: Medicaid Other | Source: Ambulatory Visit | Attending: Cardiology | Admitting: Cardiology

## 2018-03-19 DIAGNOSIS — I5022 Chronic systolic (congestive) heart failure: Secondary | ICD-10-CM

## 2018-03-19 DIAGNOSIS — I110 Hypertensive heart disease with heart failure: Secondary | ICD-10-CM | POA: Diagnosis not present

## 2018-03-21 ENCOUNTER — Encounter (HOSPITAL_COMMUNITY): Payer: Medicaid Other

## 2018-03-21 ENCOUNTER — Encounter (HOSPITAL_COMMUNITY)
Admission: RE | Admit: 2018-03-21 | Discharge: 2018-03-21 | Disposition: A | Discharge status: Home / Self Care | Payer: Medicaid Other | Source: Ambulatory Visit | Attending: Cardiology | Admitting: Cardiology

## 2018-03-21 DIAGNOSIS — I110 Hypertensive heart disease with heart failure: Secondary | ICD-10-CM | POA: Diagnosis not present

## 2018-03-21 DIAGNOSIS — I5022 Chronic systolic (congestive) heart failure: Secondary | ICD-10-CM

## 2018-03-23 ENCOUNTER — Encounter (HOSPITAL_COMMUNITY): Payer: Medicaid Other

## 2018-03-26 ENCOUNTER — Emergency Department (HOSPITAL_COMMUNITY): Payer: Medicaid Other

## 2018-03-26 ENCOUNTER — Encounter (HOSPITAL_COMMUNITY): Payer: Medicaid Other

## 2018-03-26 ENCOUNTER — Emergency Department (HOSPITAL_COMMUNITY)
Admission: EM | Admit: 2018-03-26 | Discharge: 2018-03-26 | Disposition: A | Discharge status: Home / Self Care | Payer: Medicaid Other | Attending: Emergency Medicine | Admitting: Emergency Medicine

## 2018-03-26 ENCOUNTER — Encounter (HOSPITAL_COMMUNITY): Payer: Self-pay

## 2018-03-26 ENCOUNTER — Emergency Department (EMERGENCY_DEPARTMENT_HOSPITAL): Payer: Medicaid Other

## 2018-03-26 ENCOUNTER — Other Ambulatory Visit: Payer: Self-pay

## 2018-03-26 DIAGNOSIS — I110 Hypertensive heart disease with heart failure: Secondary | ICD-10-CM | POA: Diagnosis not present

## 2018-03-26 DIAGNOSIS — M79604 Pain in right leg: Secondary | ICD-10-CM

## 2018-03-26 DIAGNOSIS — Z7982 Long term (current) use of aspirin: Secondary | ICD-10-CM | POA: Diagnosis not present

## 2018-03-26 DIAGNOSIS — Z87891 Personal history of nicotine dependence: Secondary | ICD-10-CM | POA: Diagnosis not present

## 2018-03-26 DIAGNOSIS — R0602 Shortness of breath: Secondary | ICD-10-CM | POA: Diagnosis not present

## 2018-03-26 DIAGNOSIS — E119 Type 2 diabetes mellitus without complications: Secondary | ICD-10-CM | POA: Diagnosis not present

## 2018-03-26 DIAGNOSIS — Z794 Long term (current) use of insulin: Secondary | ICD-10-CM | POA: Insufficient documentation

## 2018-03-26 DIAGNOSIS — Z7901 Long term (current) use of anticoagulants: Secondary | ICD-10-CM | POA: Diagnosis not present

## 2018-03-26 DIAGNOSIS — I5032 Chronic diastolic (congestive) heart failure: Secondary | ICD-10-CM | POA: Diagnosis not present

## 2018-03-26 LAB — BASIC METABOLIC PANEL
Anion gap: 10 (ref 5–15)
BUN: 9 mg/dL (ref 6–20)
CHLORIDE: 112 mmol/L — AB (ref 98–111)
CO2: 20 mmol/L — AB (ref 22–32)
CREATININE: 1.1 mg/dL — AB (ref 0.44–1.00)
Calcium: 8.9 mg/dL (ref 8.9–10.3)
GFR calc non Af Amer: 59 mL/min — ABNORMAL LOW (ref >60)
GLUCOSE: 145 mg/dL — AB (ref 70–99)
Potassium: 3.8 mmol/L (ref 3.5–5.1)
Sodium: 142 mmol/L (ref 135–145)

## 2018-03-26 LAB — I-STAT TROPONIN, ED
Troponin i, poc: 0.02 ng/mL (ref 0.00–0.08)
Troponin i, poc: 0 ng/mL (ref 0.00–0.08)

## 2018-03-26 LAB — CBC
HCT: 38 % (ref 36.0–46.0)
Hemoglobin: 11.8 g/dL — ABNORMAL LOW (ref 12.0–15.0)
MCH: 27.6 pg (ref 26.0–34.0)
MCHC: 31.1 g/dL (ref 30.0–36.0)
MCV: 88.8 fL (ref 78.0–100.0)
PLATELETS: 333 10*3/uL (ref 150–400)
RBC: 4.28 MIL/uL (ref 3.87–5.11)
RDW: 14.6 % (ref 11.5–15.5)
WBC: 8.7 10*3/uL (ref 4.0–10.5)

## 2018-03-26 LAB — D-DIMER, QUANTITATIVE (NOT AT ARMC): D-Dimer, Quant: 1.18 ug/mL-FEU — ABNORMAL HIGH (ref 0.00–0.50)

## 2018-03-26 LAB — BRAIN NATRIURETIC PEPTIDE: B Natriuretic Peptide: 139.3 pg/mL — ABNORMAL HIGH (ref 0.0–100.0)

## 2018-03-26 MED ORDER — IOPAMIDOL (ISOVUE-370) INJECTION 76%
100.0000 mL | Freq: Once | INTRAVENOUS | Status: AC | PRN
  Administered 2018-03-26: 100 mL via INTRAVENOUS

## 2018-03-26 MED ORDER — HYDROMORPHONE HCL 1 MG/ML IJ SOLN
1.0000 mg | Freq: Once | INTRAMUSCULAR | Status: AC
  Administered 2018-03-26: 1 mg via INTRAVENOUS
  Filled 2018-03-26: qty 1

## 2018-03-26 MED ORDER — FUROSEMIDE 10 MG/ML IJ SOLN
40.0000 mg | Freq: Once | INTRAMUSCULAR | Status: AC
  Administered 2018-03-26: 40 mg via INTRAVENOUS
  Filled 2018-03-26: qty 4

## 2018-03-26 MED ORDER — IOPAMIDOL (ISOVUE-370) INJECTION 76%
INTRAVENOUS | Status: AC
  Filled 2018-03-26: qty 100

## 2018-03-26 NOTE — ED Triage Notes (Signed)
Pt reports shortness of breath and swelling X1 week. Pt states she has been going to cardio rehab. Hx of CHF.

## 2018-03-26 NOTE — ED Notes (Signed)
Pt ambulated around nurses station with oxygen saturations maintaining 98-100% on room air although pt was tachypneic and SOB

## 2018-03-26 NOTE — Discharge Instructions (Addendum)
Take your home medicines as prescribed.  Dr. Einar Gip has requested that you increase your torsemide to twice daily instead of just once daily.  Take 1 in the morning and 1 in the afternoon.  Call his office to set up a follow-up appointment.  He wishes to see you in the next 2 to 3 days in his office for reevaluation.  You can apply Voltaren gel to the calf, keep the area elevated.  Apply heat 20 minutes on 20 minutes off.  Continue doing your cardiac rehab.  You may find it helpful to do low gravity exercises that are easier on the joints including the pool, elliptical, or bicycle.  Return to the emergency department if any concerning signs or symptoms develop such as weakness, fevers, worsening shortness of breath or chest pain, or passing out.

## 2018-03-26 NOTE — Progress Notes (Signed)
RLE venous duplex prelim: negative for DVT.  [name], RDMS, RVT  

## 2018-03-26 NOTE — ED Notes (Signed)
Main lab to add on d dimer and BNP

## 2018-03-26 NOTE — ED Provider Notes (Signed)
[facility] EMERGENCY DEPARTMENT Provider Note   CSN: [id number] Arrival date & time: 03/26/18  1108     History   Chief Complaint Chief Complaint  Patient presents with  . Shortness of Breath    HPI [name] is a 47 y.o. female with history of renal failure, CHF, hypertension, migraines, SLE, Sjogren's syndrome presents for evaluation of acute onset, progressively worsening right lower extremity pain for 1.5 weeks and shortness of breath for 1 week.  She notes a throbbing pain localized to the right calf radiating to the shin which worsens with ambulation.  No improvement with elevation, ice, Ace wrap, muscle relaxer, pain medicine, Voltaren gel.  No recent travel or surgeries, no hemoptysis, no prior history of DVT or PE.  She does note that she uses the NuvaRing for birth control.  She also notes that for the past week or so she has been progressively more short of breath with exertion.  She has been doing cardiac rehab and states that initially she was able to walk around the track without much difficulty but lately has had to take multiple breaks for several minutes to catch her breath.  She notes mild aching anterior chest pain with cough only.  Cough is productive of yellow sputum and started in the past couple of days.  She denies fevers.  She is a former smoker.  Denies exertional or pleuritic chest pain.  The history is provided by the patient.    Past Medical History:  Diagnosis Date  . Acute renal failure (HCC)     Intractable nausea vomiting secondary to diabetic gastroparesis causing dehydration and acute renal failure /notes 04/01/2013  . Anginal pain ([place])   . Anxiety   . Bell's palsy 08/09/2010  . CAP (community acquired pneumonia) 10/2011   Archie Endo 11/08/2011  . Chest pain 01/13/2014  . CHF (congestive heart failure) ([place])   . Diabetic gastroparesis associated with type 2 diabetes mellitus ([place])    this is presumed diagnoses, not confirmed by any  studies.   . Eczema   . Family history of malignant neoplasm of breast   . Family history of malignant neoplasm of ovary   . GERD (gastroesophageal reflux disease)   . High cholesterol   . Hypertension   . Liver mass 2014   biopsied 03/2013 at [facility], not malignant.  is to undergo radiologic ablation of the mass in sept/October 2014.   . Lupus ([place])   . Migraines    "maybe a couple times/yr" (04/01/2013)  . Obesity   . Obstructive sleep apnea on CPAP 2011  . On home oxygen therapy    "2L 24/7" (02/28/2015)  . SJOGREN'S SYNDROME 08/09/2010  . SLE (systemic lupus erythematosus) ([place])    Archie Endo 12/01/2010  . Stroke [facility]) 2010; 10/2012   "left side is still weak from it, never fully regained full strength; no additions from stroke 10/2012"    Patient Active Problem List   Diagnosis Date Noted  . Multifocal pneumonia 09/23/2017  . Acute respiratory failure ([place]) 09/21/2017  . CHF exacerbation ([place]) 09/20/2017  . Costochondritis 06/06/2017  . Chest pain 06/04/2017  . Diabetes ([name]) 09/19/2015  . Radiculopathy of cervical spine   . Severe headache 03/05/2014  . Family history of malignant neoplasm of breast   . Family history of malignant neoplasm of ovary   . Intractable nausea and vomiting 11/11/2013  . Septate uterus 09/08/2013  . Diabetic gastroparesis ([place]) 04/28/2013  . Constipation due to pain  medication 04/21/2013  . Odynophagia 04/04/2013  . Liver mass 02/28/2013  . Reflux esophagitis 02/26/2013  . Morbid obesity ([place]) 10/10/2012  . Chronic diastolic CHF (congestive heart failure) ([place]) 10/09/2012  . Chronic respiratory failure with hypoxia ([place]) 10/09/2012  . Clinical polymyositis syndrome ([place]) 09/06/2012  . Mononeuritis of lower limb 01/12/2012  . HTN (hypertension) 11/08/2011  . Systolic CHF, acute on chronic ([place]) 11/08/2011  . HLD (hyperlipidemia) 07/19/2011  . Bell's palsy 08/09/2010  . SLE 08/09/2010  . [place] SYNDROME 08/09/2010    Past Surgical  History:  Procedure Laterality Date  . BREAST CYST EXCISION Left 08/2005   epidermoid  . CARDIAC CATHETERIZATION  02/07/12  . ECTOPIC PREGNANCY SURGERY  1999  . ESOPHAGOGASTRODUODENOSCOPY N/A 02/26/2013   Procedure: ESOPHAGOGASTRODUODENOSCOPY (EGD);  Surgeon: Irene Shipper, MD;  Location: [facility] ENDOSCOPY;  Service: Endoscopy;  Laterality: N/A;  . ESOPHAGOGASTRODUODENOSCOPY N/A 03/02/2015   Procedure: ESOPHAGOGASTRODUODENOSCOPY (EGD);  Surgeon: Milus Banister, MD;  Location: [facility];  Service: Endoscopy;  Laterality: N/A;  . HERNIA REPAIR    . LEFT AND RIGHT HEART CATHETERIZATION WITH CORONARY ANGIOGRAM N/A 02/07/2012   Procedure: LEFT AND RIGHT HEART CATHETERIZATION WITH CORONARY ANGIOGRAM;  Surgeon: Laverda Page, MD;  Location: [facility] CATH LAB;  Service: Cardiovascular;  Laterality: N/A;  . LIVER BIOPSY  03/2013   liver mass/medical hx noted above  . MUSCLE BIOPSY     for lupus/notes 12/01/2010  . UMBILICAL HERNIA REPAIR  1980's     OB History    Gravida  2   Para  1   Term  1   Preterm      AB  1   Living  1     SAB      TAB      Ectopic  1   Multiple      Live Births  1            Home Medications    Prior to Admission medications   Medication Sig Start Date End Date Taking? Authorizing Provider  albuterol (PROAIR HFA) 108 (90 BASE) MCG/ACT inhaler Inhale 2 puffs into the lungs every 6 (six) hours as needed for wheezing or shortness of breath.    Yes [provider]  albuterol (PROVENTIL) (2.5 MG/3ML) 0.083% nebulizer solution Take 2.5 mg by nebulization every 6 (six) hours as needed for wheezing.   Yes [provider]  ALPRAZolam Duanne Moron) 0.5 MG tablet Take 0.5 mg 2 (two) times daily as needed by mouth for anxiety or sleep.  04/19/17  Yes [provider]  aspirin EC 81 MG tablet Take 81 mg by mouth daily.   Yes [provider]  azaTHIOprine (IMURAN) 50 MG tablet Take 100 mg by mouth 2 (two) times daily.    Yes [provider]  buPROPion (WELLBUTRIN) 100 MG tablet Take 100 mg by mouth every morning. 10/20/16  Yes [provider]  carvedilol (COREG) 12.5 MG tablet Take 3 tablets (37.5 mg total) 2 (two) times daily with a meal by mouth. Patient taking differently: Take 18.7 mg by mouth 2 (two) times daily with a meal.  06/06/17  Yes Vann, Jessica U, DO  cetirizine (ZYRTEC) 10 MG tablet Take 10 mg by mouth daily as needed for allergies.    Yes [provider]  clopidogrel (PLAVIX) 75 MG tablet Take 75 mg by mouth daily.   Yes [provider]  clotrimazole-betamethasone (LOTRISONE) cream Apply 1 application daily as needed topically (itching/breakouts on feet).  Yes [provider]  cyclobenzaprine (FLEXERIL) 10 MG tablet Take 10 mg 2 (two) times daily as needed by mouth.  04/19/17  Yes [provider]  cycloSPORINE (RESTASIS) 0.05 % ophthalmic emulsion Place 1 drop into both eyes 3 (three) times daily.   Yes [provider]  diclofenac sodium (VOLTAREN) 1 % GEL Apply 2-4 g 4 (four) times daily as needed topically (joint pain). 06/06/17  Yes Vann, Jessica U, DO  doxepin (SINEQUAN) 50 MG capsule Take 50-100 mg by mouth at bedtime as needed (for insomnia).    Yes [provider]  DULoxetine (CYMBALTA) 60 MG capsule Take 60 mg by mouth at bedtime.   Yes [provider]  etonogestrel-ethinyl estradiol (NUVARING) 0.12-0.015 MG/24HR vaginal ring Place 1 each every 28 (twenty-eight) days vaginally. Insert vaginally and leave in place for 3 consecutive weeks, then remove for 1 week.   Yes [provider]  exenatide (BYETTA) 10 MCG/0.04ML SOPN injection Inject 0.04 mLs (10 mcg total) into the skin 2 (two) times daily with a meal. And pen needles 2/day Patient taking differently: Inject 10 mcg into the skin 2 (two) times daily with a meal.  09/11/17  Yes Renato Shin, MD  famotidine (PEPCID) 20 MG tablet Take 20 mg by mouth 2 (two) times daily.    Yes [provider]  hydrALAZINE (APRESOLINE) 50 MG tablet Take 50 mg by mouth 3 (three) times daily.   Yes [provider]  hydrochlorothiazide (HYDRODIURIL) 25 MG tablet Take 25 mg by mouth daily.   Yes [provider]  hydroxychloroquine (PLAQUENIL) 200 MG tablet Take 200 mg by mouth 2 (two) times daily.    Yes [provider]  insulin NPH Human (HUMULIN N) 100 UNIT/ML injection Inject 2 mLs (200 Units total) into the skin every morning. And syringes 3/day Patient taking differently: Inject 200 Units into the skin every morning.  02/22/18  Yes Renato Shin, MD  magnesium oxide (MAG-OX) 400 MG tablet Take 400 mg by mouth daily. 07/19/17  Yes [provider]  megestrol (MEGACE) 40 MG tablet Take 40 mg by mouth daily. 01/30/15  Yes [provider]  metFORMIN (GLUCOPHAGE-XR) 500 MG 24 hr tablet Take 1,000 mg by mouth 2 (two) times daily.   Yes [provider]  mometasone-formoterol (DULERA) 100-5 MCG/ACT AERO Inhale 2 puffs into the lungs 2 (two) times daily. 05/29/17  Yes [provider]  Olopatadine HCl (PAZEO) 0.7 % SOLN Place 1 drop daily as needed into both eyes (itching/irritation). Pazeo   Yes [provider]  oxybutynin (DITROPAN-XL) 10 MG 24 hr tablet Take 10 mg by mouth daily. 08/30/17  Yes [provider]  Polyvinyl Alcohol-Povidone (REFRESH OP) Place 1 drop 2 (two) times daily into both eyes.    Yes [provider]  potassium chloride SA (K-DUR,KLOR-CON) 20 MEQ tablet Take 20 mEq by mouth daily.    Yes [provider]  pravastatin (PRAVACHOL) 10 MG tablet Take 10 mg by mouth daily.   Yes [provider]  predniSONE (DELTASONE) 1 MG tablet Take 3 mg by mouth daily.  04/21/17  Yes [provider]  pregabalin (LYRICA) 75 MG capsule Take 75 mg 3 (three) times daily by mouth.  01/28/15  Yes [provider]  sacubitril-valsartan (ENTRESTO) 97-103 MG Take 1 tablet by  mouth 2 (two) times daily.   Yes [provider]  topiramate (TOPAMAX) 25 MG tablet Take 75 mg by mouth at bedtime. 03/17/16  Yes [provider]  torsemide (DEMADEX) 20 MG tablet 40 mg in AM and 20 mg in PM Patient taking differently: 20 mg daily.  09/23/17  Yes Debbe Odea, MD  traMADol (ULTRAM) 50 MG tablet TAKE 1 TO 2 TABLETS BY MOUTH EVERY 6 HOURS AS NEEDED FOR PAIN**PA REQ** Patient taking differently: Take 100 mg by mouth every 6 (six) hours as needed for moderate pain.  11/03/17  Yes Mcarthur Rossetti, MD  ACCU-CHEK AVIVA PLUS test strip USE TO CHECK BLOOD SUGAR THREE TIMES A DAY 11/30/17   Renato Shin, MD  ACCU-CHEK SOFTCLIX LANCETS lancets USE TO TEST BLOOD SUGAR 3 TIMES A DAY 09/11/17   Renato Shin, MD  Blood Glucose Monitoring Suppl (ACCU-CHEK AVIVA) device Use as instructed 09/11/17 09/11/18  Renato Shin, MD  glucose blood (ACCU-CHEK AVIVA PLUS) test strip Used to check blood sugar 5x daily. 09/14/17   Renato Shin, MD  glucose blood (ACCU-CHEK GUIDE) test strip Used to check blood sugars 5x daily. 10/06/17   Renato Shin, MD  Insulin Pen Needle 32G X 4 MM MISC Used to inject insulin 1x daily. 06/15/17   Elayne Snare, MD  Insulin Syringe-Needle U-100 (COMFORT ASSIST INSULIN SYRINGE) 31G X 5/16" 1 ML MISC Use one daily with insulin 09/14/17   Renato Shin, MD  Lancets (ACCU-CHEK MULTICLIX) lancets Used to check blood sugars daily up to 5 times. 10/13/17   Renato Shin, MD  lidocaine (LIDODERM) 5 % Place 1 patch onto the skin daily. Remove & Discard patch within 12 hours or as directed by MD Patient not taking: Reported on 09/21/2017 05/28/17   Caccavale, Sophia, PA-C  morphine (MSIR) 15 MG tablet Take 1 tablet (15 mg total) every 6 (six) hours as needed by mouth for severe pain. Patient not taking: Reported on 09/21/2017 06/06/17   Geradine Girt, DO    Family History Family History  Problem Relation Age of Onset  . Diabetes Mother   . Asthma Mother   .  Urticaria Mother   . Breast cancer Maternal Aunt 68       currently 38  . Stomach cancer Maternal Uncle        dx 67s; deceased  . Prostate cancer Maternal Uncle        deceased 52s  . Cancer Maternal Aunt        unk. primary  . Cancer Maternal Aunt        unk. primary  . Ovarian cancer Maternal Aunt        deceased 18s  . Hypertension Other   . Stroke Other   . Arthritis Other   . Ovarian cancer Sister 85       maternal half-sister; RAD51C positive  . Cancer Paternal Aunt        unk. primary; deceased 31s  . Prostate cancer Paternal Uncle        deceased 71s  . Breast cancer Cousin        deceased 23s; daughter of mat uncle with stomach ca  . Breast cancer Maternal Aunt        deceased 37s  . Asthma Brother   . Asthma Brother   . Allergic rhinitis Neg Hx   . Angioedema Neg Hx     Social History Social History   Tobacco Use  . Smoking status: Former Smoker    Years: 3.00    Types: Cigarettes    Last attempt to quit: 06/01/1990    Years since quitting: 27.8  . Smokeless tobacco: Never Used  Substance  Use Topics  . Alcohol use: No    Alcohol/week: 0.0 standard drinks  . Drug use: No     Allergies   Metoclopramide; Codeine; Penicillins; Hydrocodone; and Percocet [oxycodone-acetaminophen]   Review of Systems Review of Systems  Constitutional: Negative for chills and fever.  Respiratory: Positive for cough and shortness of breath.   Cardiovascular: Positive for chest pain and leg swelling.  Gastrointestinal: Negative for abdominal pain, nausea and vomiting.  Musculoskeletal: Positive for arthralgias.  Neurological: Negative for weakness and numbness.  All other systems reviewed and are negative.    Physical Exam Updated Vital Signs BP 125/71   Pulse 72   Temp 98.2 F (36.8 C) (Oral)   Resp 19   Wt (!) 179.5 kg   LMP 03/24/2017   SpO2 98%   BMI 60.18 kg/m   Physical Exam  Constitutional: She appears well-developed and well-nourished. No  distress.  HENT:  Head: Normocephalic and atraumatic.  Eyes: Conjunctivae are normal. Right eye exhibits no discharge. Left eye exhibits no discharge.  Neck: Normal range of motion. Neck supple. No JVD present. No tracheal deviation present.  Cardiovascular: Normal rate and regular rhythm.  2+ radial and DP/PT pulses bilaterally, no pitting edema of the lower extremities.  Right calf is tender to palpation and Homans sign is present.  No erythema or warmth noted.  No palpable cords, compartments are soft.  Pulmonary/Chest: Effort normal. She has rales in the right lower field and the left lower field. She exhibits tenderness.  Speaking in full sentences without difficulty.  Equal rise and fall of chest.  Examination somewhat limited secondary to body habitus but she has clear breath sounds bilaterally on auscultation of the lungs.  Mild bibasilar crackles noted.  Abdominal: Soft. She exhibits no distension.  Musculoskeletal: She exhibits no edema.       Right lower leg: She exhibits tenderness. She exhibits no edema.       Left lower leg: She exhibits no tenderness and no edema.  Anterior chest wall tenderness to palpation noted in the parasternal region bilaterally.  No deformity, crepitus, ecchymosis, or flail segment noted.  Neurological: She is alert.  Skin: Skin is warm and dry. No erythema.  Psychiatric: She has a normal mood and affect. Her behavior is normal.  Nursing note and vitals reviewed.    ED Treatments / Results  Labs (all labs ordered are listed, but only abnormal results are displayed) Labs Reviewed  BASIC METABOLIC PANEL - Abnormal; Notable for the following components:      Result Value   Chloride 112 (*)    CO2 20 (*)    Glucose, Bld 145 (*)    Creatinine, Ser 1.10 (*)    GFR calc non Af Amer 59 (*)    All other components within normal limits  CBC - Abnormal; Notable for the following components:   Hemoglobin 11.8 (*)    All other components within normal  limits  BRAIN NATRIURETIC PEPTIDE - Abnormal; Notable for the following components:   B Natriuretic Peptide 139.3 (*)    All other components within normal limits  D-DIMER, QUANTITATIVE (NOT AT [facility]) - Abnormal; Notable for the following components:   D-Dimer, Quant 1.18 (*)    All other components within normal limits  I-STAT TROPONIN, ED  I-STAT TROPONIN, ED    EKG EKG Interpretation  Date/Time:  Monday March 26 2018 11:14:47 EDT Ventricular Rate:  104 PR Interval:  140 QRS Duration: 86 QT Interval:  302 QTC Calculation:  397 R Axis:   65 Text Interpretation:  Sinus tachycardia with occasional Premature ventricular complexes Low voltage QRS ST & T wave abnormality, consider inferior ischemia Abnormal ECG lower voltages than previous Confirmed by Theotis Burrow [id number]) on 03/26/2018 12:25:23 PM   Radiology Dg Chest 2 View  Result Date: 03/26/2018 CLINICAL DATA:  Shortness of breath. EXAM: CHEST - 2 VIEW COMPARISON:  Radiograph of September 20, 2017. FINDINGS: Stable cardiomegaly. No pneumothorax or pleural effusion is noted. Both lungs are clear. The visualized skeletal structures are unremarkable. IMPRESSION: No active cardiopulmonary disease. Electronically Signed   By: Marijo Conception, M.D.   On: 03/26/2018 11:47   Ct Angio Chest Pe W And/or Wo Contrast  Result Date: 03/26/2018 CLINICAL DATA:  PE suspected, intermediate EXAM: CT ANGIOGRAPHY CHEST WITH CONTRAST TECHNIQUE: Multidetector CT imaging of the chest was performed using the standard protocol during bolus administration of intravenous contrast. Multiplanar CT image reconstructions and MIPs were obtained to evaluate the vascular anatomy. CONTRAST:  121m ISOVUE-370 IOPAMIDOL (ISOVUE-370) INJECTION 76% COMPARISON:  09/20/2017 FINDINGS: Cardiovascular: Chronic cardiomegaly. No pericardial effusion. No acute aortic finding. Significantly suboptimal pulmonary artery opacification due to transient interruption of contrast. This is  exacerbated by patient size and motion. A PE study in 2018 has similar although less severe limitations-V/Q scan was ultimately required at that time. Mediastinum/Nodes: No evidence of adenopathy.  No pneumomediastinum Lungs/Pleura: There is no edema, consolidation, effusion, or pneumothorax. Upper Abdomen: Negative Musculoskeletal: No acute finding Review of the MIP images confirms the above findings. IMPRESSION: 1. Nondiagnostic PE study due to patient factors. Similar findings were seen on a 2018 PE CT. 2. No acute finding. 3. Chronic cardiomegaly. Electronically Signed   By: JMonte FantasiaM.D.   On: 03/26/2018 15:05    Procedures Procedures (including critical care time)  Medications Ordered in ED Medications  iopamidol (ISOVUE-370) 76 % injection (has no administration in time range)  HYDROmorphone (DILAUDID) injection 1 mg (1 mg Intravenous Given 03/26/18 1533)  iopamidol (ISOVUE-370) 76 % injection 100 mL (100 mLs Intravenous Contrast Given 03/26/18 1424)  furosemide (LASIX) injection 40 mg (40 mg Intravenous Given 03/26/18 1719)     Initial Impression / Assessment and Plan / ED Course  I have reviewed the triage vital signs and the nursing notes.  Pertinent labs & imaging results that were available during my care of the patient were reviewed by me and considered in my medical decision making (see chart for details).     Patient presents with worsening right calf pain and shortness of breath for 1 week.  She is afebrile, vital signs are stable.  She is nontoxic in appearance.  She was ambulated and was tachypneic to around 30 respirations per minute but was not hypoxic.  Lab work reviewed by me shows BNP very mildly elevated, lower than her baseline.  Patient was weighed and she has not gained an excessive amount of weight and is actually lower than previous documented weights.  Creatinine is at patient's baseline.  No metabolic derangements.  No leukocytosis.  No significant anemia.   She is neurovascularly intact.  D-dimer was elevated.  Will obtain vascular ultrasound and CTA to assess for DVT and PE.  EKG shows no significant changes from last tracing.  No ischemic changes noted.  Serial troponins are negative.  Doubt ACS/MI.  Chest pain is reproducible on palpation.  Chest x-ray shows no acute cardiopulmonary abnormalities.  Patient ultrasound was negative for DVT and CTA shows no definitive evidence of PE.  No evidence of pleural effusion, pneumonia, dissection, pericarditis, myocarditis, or cardiac tamponade.  She received IV Lasix in the ED. 5:21 PM Spoke with Dr. Einar Gip patient's cardiologist.  He recommends increasing 20 Torsemide BID x1 week. He will see her in the office for close follow-up in the next 2-3 days.  No further emergent work-up required at this time.  Patient stable for discharge home with follow-up with her cardiologist as detailed above. RICE therapy indicated and discussed.  Patient may have some component of muscle spasm or tendinitis.  She has chronic knee pain she has had increased activity due to cardiac rehab in the past few weeks.  Discussed strict ED return precautions.  Patient and patient's roommate verbalized understanding of and agreement with plan and patient is stable for discharge home at this time. Final Clinical Impressions(s) / ED Diagnoses   Final diagnoses:  Shortness of breath  Right leg pain    ED Discharge Orders    None       Renita Papa, PA-C 03/26/18 1739    Little, Wenda Overland, MD 03/28/18 [phone]

## 2018-03-28 ENCOUNTER — Encounter (HOSPITAL_COMMUNITY): Payer: Medicaid Other

## 2018-03-30 ENCOUNTER — Encounter (HOSPITAL_COMMUNITY)
Admission: RE | Admit: 2018-03-30 | Discharge: 2018-03-30 | Disposition: A | Discharge status: Home / Self Care | Payer: Medicaid Other | Source: Ambulatory Visit | Attending: Cardiology | Admitting: Cardiology

## 2018-03-30 ENCOUNTER — Encounter (HOSPITAL_COMMUNITY): Payer: Medicaid Other

## 2018-03-30 DIAGNOSIS — I5022 Chronic systolic (congestive) heart failure: Secondary | ICD-10-CM

## 2018-03-30 DIAGNOSIS — I110 Hypertensive heart disease with heart failure: Secondary | ICD-10-CM | POA: Diagnosis not present

## 2018-04-04 ENCOUNTER — Encounter (HOSPITAL_COMMUNITY): Payer: Self-pay

## 2018-04-04 ENCOUNTER — Encounter (HOSPITAL_COMMUNITY): Payer: Medicaid Other

## 2018-04-05 NOTE — Progress Notes (Signed)
Cardiac Individual Treatment Plan  Patient Details  Name: [name] MRN: [id number] Date of Birth: 10-May-1971 Referring Provider:     CARDIAC REHAB PHASE II ORIENTATION from 02/08/2018 in [facility]  Referring Provider  Adrian Prows MD       Initial Encounter Date:    CARDIAC REHAB PHASE II ORIENTATION from 02/08/2018 in [facility]  Date  02/08/18      Visit Diagnosis: Chronic systolic congestive heart failure ([place])  Patient's Home Medications on Admission:  Current Outpatient Medications:  .  ACCU-CHEK AVIVA PLUS test strip, USE TO CHECK BLOOD SUGAR THREE TIMES A DAY, Disp: 300 each, Rfl: 0 .  ACCU-CHEK SOFTCLIX LANCETS lancets, USE TO TEST BLOOD SUGAR 3 TIMES A DAY, Disp: 300 each, Rfl: 0 .  albuterol (PROAIR HFA) 108 (90 BASE) MCG/ACT inhaler, Inhale 2 puffs into the lungs every 6 (six) hours as needed for wheezing or shortness of breath. , Disp: , Rfl:  .  albuterol (PROVENTIL) (2.5 MG/3ML) 0.083% nebulizer solution, Take 2.5 mg by nebulization every 6 (six) hours as needed for wheezing., Disp: , Rfl:  .  ALPRAZolam (XANAX) 0.5 MG tablet, Take 0.5 mg 2 (two) times daily as needed by mouth for anxiety or sleep. , Disp: , Rfl:  .  aspirin EC 81 MG tablet, Take 81 mg by mouth daily., Disp: , Rfl:  .  azaTHIOprine (IMURAN) 50 MG tablet, Take 100 mg by mouth 2 (two) times daily. , Disp: , Rfl:  .  Blood Glucose Monitoring Suppl (ACCU-CHEK AVIVA) device, Use as instructed, Disp: 1 each, Rfl: 0 .  buPROPion (WELLBUTRIN) 100 MG tablet, Take 100 mg by mouth every morning., Disp: , Rfl:  .  carvedilol (COREG) 12.5 MG tablet, Take 3 tablets (37.5 mg total) 2 (two) times daily with a meal by mouth. (Patient taking differently: Take 18.7 mg by mouth 2 (two) times daily with a meal. ), Disp: 180 tablet, Rfl: 0 .  cetirizine (ZYRTEC) 10 MG tablet, Take 10 mg by mouth daily as needed for allergies. , Disp: , Rfl:  .  clopidogrel  (PLAVIX) 75 MG tablet, Take 75 mg by mouth daily., Disp: , Rfl:  .  clotrimazole-betamethasone (LOTRISONE) cream, Apply 1 application daily as needed topically (itching/breakouts on feet). , Disp: , Rfl:  .  cyclobenzaprine (FLEXERIL) 10 MG tablet, Take 10 mg 2 (two) times daily as needed by mouth. , Disp: , Rfl:  .  cycloSPORINE (RESTASIS) 0.05 % ophthalmic emulsion, Place 1 drop into both eyes 3 (three) times daily., Disp: , Rfl:  .  diclofenac sodium (VOLTAREN) 1 % GEL, Apply 2-4 g 4 (four) times daily as needed topically (joint pain)., Disp: , Rfl:  .  doxepin (SINEQUAN) 50 MG capsule, Take 50-100 mg by mouth at bedtime as needed (for insomnia). , Disp: , Rfl:  .  DULoxetine (CYMBALTA) 60 MG capsule, Take 60 mg by mouth at bedtime., Disp: , Rfl:  .  etonogestrel-ethinyl estradiol (NUVARING) 0.12-0.015 MG/24HR vaginal ring, Place 1 each every 28 (twenty-eight) days vaginally. Insert vaginally and leave in place for 3 consecutive weeks, then remove for 1 week., Disp: , Rfl:  .  exenatide (BYETTA) 10 MCG/0.04ML SOPN injection, Inject 0.04 mLs (10 mcg total) into the skin 2 (two) times daily with a meal. And pen needles 2/day (Patient taking differently: Inject 10 mcg into the skin 2 (two) times daily with a meal. ), Disp: 2.4 mL, Rfl: 11 .  famotidine (PEPCID) 20 MG tablet, Take 20 mg by mouth 2 (two) times daily., Disp: , Rfl:  .  glucose blood (ACCU-CHEK AVIVA PLUS) test strip, Used to check blood sugar 5x daily., Disp: 450 each, Rfl: 2 .  glucose blood (ACCU-CHEK GUIDE) test strip, Used to check blood sugars 5x daily., Disp: 200 each, Rfl: 12 .  hydrALAZINE (APRESOLINE) 50 MG tablet, Take 50 mg by mouth 3 (three) times daily., Disp: , Rfl:  .  hydrochlorothiazide (HYDRODIURIL) 25 MG tablet, Take 25 mg by mouth daily., Disp: , Rfl:  .  hydroxychloroquine (PLAQUENIL) 200 MG tablet, Take 200 mg by mouth 2 (two) times daily. , Disp: , Rfl:  .  insulin NPH Human (HUMULIN N) 100 UNIT/ML injection,  Inject 2 mLs (200 Units total) into the skin every morning. And syringes 3/day (Patient taking differently: Inject 200 Units into the skin every morning. ), Disp: 70 mL, Rfl: 11 .  Insulin Pen Needle 32G X 4 MM MISC, Used to inject insulin 1x daily., Disp: 100 each, Rfl: 5 .  Insulin Syringe-Needle U-100 (COMFORT ASSIST INSULIN SYRINGE) 31G X 5/16" 1 ML MISC, Use one daily with insulin, Disp: 90 each, Rfl: 4 .  Lancets (ACCU-CHEK MULTICLIX) lancets, Used to check blood sugars daily up to 5 times., Disp: 204 each, Rfl: 12 .  lidocaine (LIDODERM) 5 %, Place 1 patch onto the skin daily. Remove & Discard patch within 12 hours or as directed by MD (Patient not taking: Reported on 09/21/2017), Disp: 30 patch, Rfl: 0 .  magnesium oxide (MAG-OX) 400 MG tablet, Take 400 mg by mouth daily., Disp: , Rfl:  .  megestrol (MEGACE) 40 MG tablet, Take 40 mg by mouth daily., Disp: , Rfl: 3 .  metFORMIN (GLUCOPHAGE-XR) 500 MG 24 hr tablet, Take 1,000 mg by mouth 2 (two) times daily., Disp: , Rfl:  .  mometasone-formoterol (DULERA) 100-5 MCG/ACT AERO, Inhale 2 puffs into the lungs 2 (two) times daily., Disp: , Rfl:  .  morphine (MSIR) 15 MG tablet, Take 1 tablet (15 mg total) every 6 (six) hours as needed by mouth for severe pain. (Patient not taking: Reported on 09/21/2017), Disp: 6 tablet, Rfl: 0 .  Olopatadine HCl (PAZEO) 0.7 % SOLN, Place 1 drop daily as needed into both eyes (itching/irritation). Pazeo, Disp: , Rfl:  .  oxybutynin (DITROPAN-XL) 10 MG 24 hr tablet, Take 10 mg by mouth daily., Disp: , Rfl:  .  Polyvinyl Alcohol-Povidone (REFRESH OP), Place 1 drop 2 (two) times daily into both eyes. , Disp: , Rfl:  .  potassium chloride SA (K-DUR,KLOR-CON) 20 MEQ tablet, Take 20 mEq by mouth daily. , Disp: , Rfl:  .  pravastatin (PRAVACHOL) 10 MG tablet, Take 10 mg by mouth daily., Disp: , Rfl:  .  predniSONE (DELTASONE) 1 MG tablet, Take 3 mg by mouth daily. , Disp: , Rfl:  .  pregabalin (LYRICA) 75 MG capsule, Take 75  mg 3 (three) times daily by mouth. , Disp: , Rfl:  .  sacubitril-valsartan (ENTRESTO) 97-103 MG, Take 1 tablet by mouth 2 (two) times daily., Disp: , Rfl:  .  topiramate (TOPAMAX) 25 MG tablet, Take 75 mg by mouth at bedtime., Disp: , Rfl:  .  torsemide (DEMADEX) 20 MG tablet, 40 mg in AM and 20 mg in PM (Patient taking differently: 20 mg daily. ), Disp: 30 tablet, Rfl: 0 .  traMADol (ULTRAM) 50 MG tablet, TAKE 1 TO 2 TABLETS BY MOUTH EVERY 6 HOURS AS NEEDED FOR  PAIN**PA REQ** (Patient taking differently: Take 100 mg by mouth every 6 (six) hours as needed for moderate pain. ), Disp: 60 tablet, Rfl: 0  Past Medical History: Past Medical History:  Diagnosis Date  . Acute renal failure (HCC)     Intractable nausea vomiting secondary to diabetic gastroparesis causing dehydration and acute renal failure /notes 04/01/2013  . Anginal pain ([place])   . Anxiety   . Bell's palsy 08/09/2010  . CAP (community acquired pneumonia) 10/2011   Archie Endo 11/08/2011  . Chest pain 01/13/2014  . CHF (congestive heart failure) ([place])   . Diabetic gastroparesis associated with type 2 diabetes mellitus ([place])    this is presumed diagnoses, not confirmed by any studies.   . Eczema   . Family history of malignant neoplasm of breast   . Family history of malignant neoplasm of ovary   . GERD (gastroesophageal reflux disease)   . High cholesterol   . Hypertension   . Liver mass 2014   biopsied 03/2013 at [facility], not malignant.  is to undergo radiologic ablation of the mass in sept/October 2014.   . Lupus ([place])   . Migraines    "maybe a couple times/yr" (04/01/2013)  . Obesity   . Obstructive sleep apnea on CPAP 2011  . On home oxygen therapy    "2L 24/7" (02/28/2015)  . SJOGREN'S SYNDROME 08/09/2010  . SLE (systemic lupus erythematosus) ([place])    Archie Endo 12/01/2010  . Stroke [facility]) 2010; 10/2012   "left side is still weak from it, never fully regained full strength; no additions from stroke 10/2012"    Tobacco  Use: Social History   Tobacco Use  Smoking Status Former Smoker  . Years: 3.00  . Types: Cigarettes  . Last attempt to quit: 06/01/1990  . Years since quitting: 27.8  Smokeless Tobacco Never Used    Labs: Recent Review Flowsheet Data    Labs for ITP Cardiac and Pulmonary Rehab Latest Ref Rng & Units 09/11/2017 09/20/2017 09/20/2017 11/09/2017 02/21/2018   Cholestrol 0 - 200 mg/dL - - - - -   LDLCALC 0 - 99 mg/dL - - - - -   HDL >39 mg/dL - - - - -   Trlycerides <150 mg/dL - - - - -   Hemoglobin A1c 4.0 - 5.6 % 9.8 - - 6.9 6.4(A)   PHART 7.350 - 7.450 - - 7.324(L) - -   PCO2ART 32.0 - 48.0 mmHg - - 45.4 - -   HCO3 20.0 - 28.0 mmol/L - - 23.6 - -   TCO2 22 - 32 mmol/L - 24 25 - -   ACIDBASEDEF 0.0 - 2.0 mmol/L - - 3.0(H) - -   O2SAT % - - 99.0 - -      Capillary Blood Glucose: Lab Results  Component Value Date   GLUCAP 146 (H) 03/14/2018   GLUCAP 109 (H) 02/28/2018   GLUCAP 124 (H) 02/26/2018   GLUCAP 88 02/26/2018   GLUCAP 129 (H) 02/16/2018     Exercise Target Goals: Exercise Program Goal: Individual exercise prescription set using results from initial 6 min walk test and THRR while considering  patient's activity barriers and safety.   Exercise Prescription Goal: Initial exercise prescription builds to 30-45 minutes a day of aerobic activity, 2-3 days per week.  Home exercise guidelines will be given to patient during program as part of exercise prescription that the participant will acknowledge.  Activity Barriers & Risk Stratification: Activity Barriers & Cardiac Risk Stratification - 02/08/18 0931  Activity Barriers & Cardiac Risk Stratification   Activity Barriers  Arthritis;Muscular Weakness;Deconditioning;Joint Problems;Shortness of Breath;Back Problems;Other (comment)    Comments  Lupus flare up    Cardiac Risk Stratification  High       6 Minute Walk: 6 Minute Walk    Row Name 02/08/18 1228         6 Minute Walk   Phase  Initial     Distance   498 feet     Walk Time  6 minutes     # of Rest Breaks  0     MPH  0.94     METS  1.14     RPE  14     Perceived Dyspnea   2     VO2 Peak  4.01     Symptoms  Yes (comment)     Comments  Moderate SOB +2, Took break for 1 min and 29 secs due to SOB      Resting HR  85 bpm     Resting BP  120/80     Resting Oxygen Saturation   100 %     Exercise Oxygen Saturation  during 6 min walk  100 %     Max Ex. HR  120 bpm     Max Ex. BP  140/80     2 Minute Post BP  110/70        Oxygen Initial Assessment:   Oxygen Re-Evaluation:   Oxygen Discharge (Final Oxygen Re-Evaluation):   Initial Exercise Prescription: Initial Exercise Prescription - 02/08/18 1200      Date of Initial Exercise RX and Referring Provider   Date  02/08/18    Referring Provider  Adrian Prows MD     Expected Discharge Date  05/13/18      NuStep   Level  2    SPM  75    Minutes  10    METs  1.2      Arm Ergometer   Level  2    Watts  25    Minutes  10    METs  2      Track   Laps  4    Minutes  10    METs  0.9      Prescription Details   Frequency (times per week)  3x    Duration  Progress to 30 minutes of continuous aerobic without signs/symptoms of physical distress      Intensity   THRR 40-80% of Max Heartrate  67-138    Ratings of Perceived Exertion  11-13    Perceived Dyspnea  0-4      Progression   Progression  Continue progressive overload as per policy without signs/symptoms or physical distress.      Resistance Training   Training Prescription  Yes    Weight  4lbs    Reps  10-15       Perform Capillary Blood Glucose checks as needed.  Exercise Prescription Changes: Exercise Prescription Changes    Row Name 02/14/18 1454 03/07/18 1538 03/21/18 1641 03/30/18 1416       Response to Exercise   Blood Pressure (Admit)  120/84  147/97  118/72  114/76    Blood Pressure (Exercise)  142/78  128/82  156/80  128/66    Blood Pressure (Exit)  118/78  122/72  132/70  110/60    Heart  Rate (Admit)  96 bpm  85 bpm  95 bpm  99 bpm  Heart Rate (Exercise)  131 bpm  106 bpm  131 bpm  113 bpm    Heart Rate (Exit)  96 bpm  80 bpm  100 bpm  87 bpm    Rating of Perceived Exertion (Exercise)  _0 Perceived Dyspnea (Exercise)  0  0  0  0    Symptoms  None   None   None  None    Comments  Pt oriented to exercise equipment   -  None  -    Duration  Progress to 30 minutes of  aerobic without signs/symptoms of physical distress  Continue with 30 min of aerobic exercise without signs/symptoms of physical distress.  Continue with 30 min of aerobic exercise without signs/symptoms of physical distress.  Continue with 30 min of aerobic exercise without signs/symptoms of physical distress.    Intensity  THRR New  THRR unchanged  THRR unchanged  THRR unchanged      Progression   Progression  Continue to progress workloads to maintain intensity without signs/symptoms of physical distress.  Continue to progress workloads to maintain intensity without signs/symptoms of physical distress.  Continue to progress workloads to maintain intensity without signs/symptoms of physical distress.  Continue to progress workloads to maintain intensity without signs/symptoms of physical distress.    Average METs  2  1.9  1.8  1.84      Resistance Training   Training Prescription  No  Yes  No  Yes    Weight  -  4lbs  -  4lbs    Reps  -  10-15  -  10-15    Time  -  10 Minutes  -  10 Minutes      Interval Training   Interval Training  No  No  No  No      NuStep   Level  _1 SPM  75  75  85  95    Minutes  _2 METs  1.9  1.9  1.8  2      Arm Ergometer   Level  -  _3 Watts  -  _4 Minutes  -  _5 METs  -  1.8  1.8  1.8      Track   Laps  _6 Minutes  _7 METs  0.9  1.84  1.84  1.84      Home Exercise Plan   Plans to continue exercise at  -  Home (comment) Stationary Bike @ [place] (comment) [place]  @ [facility] (comment) [employer] @ Home     Frequency  -  Add 2 additional days to program exercise sessions.  Add 2 additional days to program exercise sessions.  Add 2 additional days to program exercise sessions.    Initial Home Exercises Provided  -  03/07/18  03/07/18  03/07/18       Exercise Comments: Exercise Comments    Row Name 02/14/18 1455 03/08/18 1537 03/30/18 1407       Exercise Comments  Pt's first day of exercise. Pt responded well to exercise prescription. Will continue to work with  pt to increase workloads as tolerated.   Reviewed HEP with pt. Pt has stationary bike at home. Pt will gradually build her stamina to work up to 30 minutes. Will continue to monitor pt and follow up regarding HEP.   Reviewed METs and Goals with pt. Encouarged pt to continue to attend rehab regularly. Will continue to work with pt and progress as tolerated.         Exercise Goals and Review: Exercise Goals    Row Name 02/08/18 0934             Exercise Goals   Increase Physical Activity  Yes       Intervention  Provide advice, education, support and counseling about physical activity/exercise needs.;Develop an individualized exercise prescription for aerobic and resistive training based on initial evaluation findings, risk stratification, comorbidities and participant's personal goals.       Expected Outcomes  Short Term: Attend rehab on a regular basis to increase amount of physical activity.;Long Term: Add in home exercise to make exercise part of routine and to increase amount of physical activity.;Long Term: Exercising regularly at least 3-5 days a week.       Increase Strength and Stamina  Yes       Intervention  Provide advice, education, support and counseling about physical activity/exercise needs.;Develop an individualized exercise prescription for aerobic and resistive training based on initial evaluation findings, risk stratification, comorbidities and participant's personal  goals.       Expected Outcomes  Short Term: Increase workloads from initial exercise prescription for resistance, speed, and METs.;Short Term: Perform resistance training exercises routinely during rehab and add in resistance training at home;Long Term: Improve cardiorespiratory fitness, muscular endurance and strength as measured by increased METs and functional capacity (6MWT)       Able to understand and use rate of perceived exertion (RPE) scale  Yes       Intervention  Provide education and explanation on how to use RPE scale       Expected Outcomes  Short Term: Able to use RPE daily in rehab to express subjective intensity level;Long Term:  Able to use RPE to guide intensity level when exercising independently       Knowledge and understanding of Target Heart Rate Range (THRR)  Yes       Intervention  Provide education and explanation of THRR including how the numbers were predicted and where they are located for reference       Expected Outcomes  Short Term: Able to state/look up THRR;Long Term: Able to use THRR to govern intensity when exercising independently;Short Term: Able to use daily as guideline for intensity in rehab       Able to check pulse independently  Yes       Intervention  Provide education and demonstration on how to check pulse in carotid and radial arteries.;Review the importance of being able to check your own pulse for safety during independent exercise       Expected Outcomes  Short Term: Able to explain why pulse checking is important during independent exercise;Long Term: Able to check pulse independently and accurately       Understanding of Exercise Prescription  Yes       Intervention  Provide education, explanation, and written materials on patient's individual exercise prescription       Expected Outcomes  Short Term: Able to explain program exercise prescription;Long Term: Able to explain home exercise prescription to exercise independently  Exercise  Goals Re-Evaluation : Exercise Goals Re-Evaluation    Row Name 03/08/18 1540 03/30/18 1409           Exercise Goal Re-Evaluation   Exercise Goals Review  Increase Physical Activity;Increase Strength and Stamina;Able to understand and use rate of perceived exertion (RPE) scale;Knowledge and understanding of Target Heart Rate Range (THRR);Able to check pulse independently;Understanding of Exercise Prescription  Increase Physical Activity;Understanding of Exercise Prescription      Comments  Reviewed HEP with pt. Reviewed THRR, RPE Scale, weather conditions, endpoints of exercise, NTG use, warmup and cool down.  Pt puts forth good effort despite orthopedic limitations. Working with pt to increase workloads and MET level as tolerated.       Expected Outcomes  Pt will continue to exercise using stationary bike 2 days a week for 10 minutes. Pt will gradually add a minute, until she gets to 30 minutes. Pt will continue to increase stamina and strength. Will continue to monitor and progress pt.   Pt will continue to increase cardiorespiratory fitness and functinal fitness. Pt will continue to exercise at home, 2 days a week using stationary bike. Will continue to monitor and progress as tolerated.          Discharge Exercise Prescription (Final Exercise Prescription Changes): Exercise Prescription Changes - 03/30/18 1416      Response to Exercise   Blood Pressure (Admit)  114/76    Blood Pressure (Exercise)  128/66    Blood Pressure (Exit)  110/60    Heart Rate (Admit)  99 bpm    Heart Rate (Exercise)  113 bpm    Heart Rate (Exit)  87 bpm    Rating of Perceived Exertion (Exercise)  12    Perceived Dyspnea (Exercise)  0    Symptoms  None    Duration  Continue with 30 min of aerobic exercise without signs/symptoms of physical distress.    Intensity  THRR unchanged      Progression   Progression  Continue to progress workloads to maintain intensity without signs/symptoms of physical distress.     Average METs  1.84      Resistance Training   Training Prescription  Yes    Weight  4lbs    Reps  10-15    Time  10 Minutes      Interval Training   Interval Training  No      NuStep   Level  2    SPM  95    Minutes  10    METs  2      Arm Ergometer   Level  3    Watts  25    Minutes  10    METs  1.8      Track   Laps  5    Minutes  10    METs  1.84      Home Exercise Plan   Plans to continue exercise at  Home (comment)   Stationary Bike @ Home    Frequency  Add 2 additional days to program exercise sessions.    Initial Home Exercises Provided  03/07/18       Nutrition:  Target Goals: Understanding of nutrition guidelines, daily intake of sodium <152m, cholesterol <2012m calories 30% from fat and 7% or less from saturated fats, daily to have 5 or more servings of fruits and vegetables.  Biometrics: Pre Biometrics - 02/08/18 1232      Pre Biometrics   Height  5' 8.75" (1.746  m)    Weight  (!) 179.8 kg    Waist Circumference  62.25 inches    Hip Circumference  64 inches    Waist to Hip Ratio  0.97 %    BMI (Calculated)  58.98    Triceps Skinfold  66 mm    % Body Fat  67.7 %    Grip Strength  34 kg    Flexibility  9 in    Single Leg Stand  4 seconds        Nutrition Therapy Plan and Nutrition Goals: Nutrition Therapy & Goals - 02/08/18 1351      Nutrition Therapy   Diet  consistent carbohydrate heart healthy      Personal Nutrition Goals   Nutrition Goal  Pt to identify food quantities necessary to achieve weight loss of 6-24 lbs. at graduation from cardiac rehab. Goal wt loss of 20 lb desired.     Personal Goal #2  Pt to identify and limit food sources of saturated fat, trans fat, and sodium      Intervention Plan   Intervention  Prescribe, educate and counsel regarding individualized specific dietary modifications aiming towards targeted core components such as weight, hypertension, lipid management, diabetes, heart failure and other  comorbidities.    Expected Outcomes  Short Term Goal: Understand basic principles of dietary content, such as calories, fat, sodium, cholesterol and nutrients.       Nutrition Assessments: Nutrition Assessments - 02/08/18 1352      MEDFICTS Scores   Pre Score  44       Nutrition Goals Re-Evaluation: Nutrition Goals Re-Evaluation    Row Name 02/08/18 1351             Goals   Current Weight  396 lb 6.2 oz (179.8 kg)          Nutrition Goals Re-Evaluation: Nutrition Goals Re-Evaluation    [place] Name 02/08/18 1351             Goals   Current Weight  396 lb 6.2 oz (179.8 kg)          Nutrition Goals Discharge (Final Nutrition Goals Re-Evaluation): Nutrition Goals Re-Evaluation - 02/08/18 1351      Goals   Current Weight  396 lb 6.2 oz (179.8 kg)       Psychosocial: Target Goals: Acknowledge presence or absence of significant depression and/or stress, maximize coping skills, provide positive support system. Participant is able to verbalize types and ability to use techniques and skills needed for reducing stress and depression.  Initial Review & Psychosocial Screening: Initial Psych Review & Screening - 02/08/18 0918      Initial Review   Current issues with  Current Depression      Family Dynamics   Good Support System?  No   no self identified support system   Concerns  No support system;Recent loss of significant other   mother and sister    Comments  pt currently working with personal therapist, [name] at [place]; pt also socially isolatd due to physical and emotional limitations      Barriers   Psychosocial barriers to participate in program  Psychosocial barriers identified (see note)   self social isolation from depression and physical barriers, pt faith altered during grief process      Screening Interventions   Interventions  Encouraged to exercise;Program counselor consult;To provide support and resources with identified  psychosocial needs;Provide feedback about the scores to participant    Expected  Outcomes  Short Term goal: Utilizing psychosocial counselor, staff and physician to assist with identification of specific Stressors or current issues interfering with healing process. Setting desired goal for each stressor or current issue identified.;Long Term Goal: Stressors or current issues are controlled or eliminated.;Short Term goal: Identification and review with participant of any Quality of Life or Depression concerns found by scoring the questionnaire.;Long Term goal: The participant improves quality of Life and PHQ9 Scores as seen by post scores and/or verbalization of changes       Quality of Life Scores: Quality of Life - 02/08/18 0919      Quality of Life   Select  Quality of Life      Quality of Life Scores   Health/Function Pre  14.63 %    Socioeconomic Pre  16.06 %    Psych/Spiritual Pre  2.57 %    Family Pre  20.4 %    GLOBAL Pre  13.29 %      Scores of 19 and below usually indicate a poorer quality of life in these areas.  A difference of  2-3 points is a clinically meaningful difference.  A difference of 2-3 points in the total score of the Quality of Life Index has been associated with significant improvement in overall quality of life, self-image, physical symptoms, and general health in studies assessing change in quality of life.  PHQ-9: Recent Review Flowsheet Data    Depression screen [facility] 2/9 02/14/2018   Decreased Interest 1   Down, Depressed, Hopeless 1   PHQ - 2 Score 2     Interpretation of Total Score  Total Score Depression Severity:  1-4 = Minimal depression, 5-9 = Mild depression, 10-14 = Moderate depression, 15-19 = Moderately severe depression, 20-27 = Severe depression   Psychosocial Evaluation and Intervention: Psychosocial Evaluation - 02/14/18 1422      Psychosocial Evaluation & Interventions   Interventions  Therapist referral;Encouraged to exercise with the  program and follow exercise prescription;Stress management education;Relaxation education   Pt currently meets with therapist, [name].   Comments  [name] has current depression. She is not happy with her health and general appearance.  She has also suffered loss of her support system. She meets with her therapists and feels that this is helpful. She has been socially isolated due to depression and physical barriers but is starting to do things with her neighbor. Previously Chelle enjoyed traveling and going to museums.     Expected Outcomes  [name] will become less isolated and see herself in a more positive manner.    Continue Psychosocial Services   Follow up required by staff       Psychosocial Re-Evaluation: Psychosocial Re-Evaluation    [place] Name 03/08/18 1147 04/04/18 1708           Psychosocial Re-Evaluation   Current issues with  Current Depression  Current Depression      Comments  [name] enjoys coming to CR.  She continues to meet with her therapist.  No interventions necessary at this time.  [name] enjoys coming to CR.  She continues to meet with her therapist.  No interventions necessary at this time.      Expected Outcomes  Iyana will exhibit a positive outlook with good coping skills.  Jaydalynn will continue to meet with her therapist as [name] and develop coping skills for when she feels depressed.       Interventions  Encouraged to attend Cardiac Rehabilitation for the exercise;Stress management education;Relaxation education  Encouraged to attend Cardiac Rehabilitation for the exercise;Stress management education;Relaxation education      Continue Psychosocial Services   Follow up required by staff  Follow up required by staff         Psychosocial Discharge (Final Psychosocial Re-Evaluation): Psychosocial Re-Evaluation - 04/04/18 1708      Psychosocial Re-Evaluation   Current issues with  Current Depression    Comments  [name] enjoys coming to CR.  She continues to  meet with her therapist.  No interventions necessary at this time.    Expected Outcomes  Brylyn will continue to meet with her therapist as [name] and develop coping skills for when she feels depressed.     Interventions  Encouraged to attend Cardiac Rehabilitation for the exercise;Stress management education;Relaxation education    Continue Psychosocial Services   Follow up required by staff       Vocational Rehabilitation: Provide vocational rehab assistance to qualifying candidates.   Vocational Rehab Evaluation & Intervention: Vocational Rehab - 02/08/18 0918      Initial Vocational Rehab Evaluation & Intervention   Assessment shows need for Vocational Rehabilitation  No   medically disabled       Education: Education Goals: Education classes will be provided on a weekly basis, covering required topics. Participant will state understanding/return demonstration of topics presented.  Learning Barriers/Preferences: Learning Barriers/Preferences - 02/08/18 0930      Learning Barriers/Preferences   Learning Barriers  Sight    Learning Preferences  Video;Pictoral;Written Material       Education Topics: Count Your Pulse:  -Group instruction provided by verbal instruction, demonstration, patient participation and written materials to support subject.  Instructors address importance of being able to find your pulse and how to count your pulse when at home without a heart monitor.  Patients get hands on experience counting their pulse with staff help and individually.   CARDIAC REHAB PHASE II EXERCISE from 03/21/2018 in [place]  Date  03/09/18  Instruction Review Code  2- Demonstrated Understanding      Heart Attack, Angina, and Risk Factor Modification:  -Group instruction provided by verbal instruction, video, and written materials to support subject.  Instructors address signs and symptoms of angina and heart attacks.    Also discuss risk  factors for heart disease and how to make changes to improve heart health risk factors.   CARDIAC REHAB PHASE II EXERCISE from 03/21/2018 in [place]  Date  03/07/18  Instruction Review Code  2- Demonstrated Understanding      Functional Fitness:  -Group instruction provided by verbal instruction, demonstration, patient participation, and written materials to support subject.  Instructors address safety measures for doing things around the house.  Discuss how to get up and down off the floor, how to pick things up properly, how to safely get out of a chair without assistance, and balance training.   Meditation and Mindfulness:  -Group instruction provided by verbal instruction, patient participation, and written materials to support subject.  Instructor addresses importance of mindfulness and meditation practice to help reduce stress and improve awareness.  Instructor also leads participants through a meditation exercise.    Stretching for Flexibility and Mobility:  -Group instruction provided by verbal instruction, patient participation, and written materials to support subject.  Instructors lead participants through series of stretches that are designed to increase flexibility thus improving mobility.  These stretches are additional exercise for major muscle groups that are typically performed during  regular warm up and cool down.   Hands Only CPR:  -Group verbal, video, and participation provides a basic overview of AHA guidelines for community CPR. Role-play of emergencies allow participants the opportunity to practice calling for help and chest compression technique with discussion of AED use.   Hypertension: -Group verbal and written instruction that provides a basic overview of hypertension including the most recent diagnostic guidelines, risk factor reduction with self-care instructions and medication management.    Nutrition I class: Heart Healthy  Eating:  -Group instruction provided by PowerPoint slides, verbal discussion, and written materials to support subject matter. The instructor gives an explanation and review of the Therapeutic Lifestyle Changes diet recommendations, which includes a discussion on lipid goals, dietary fat, sodium, fiber, plant stanol/sterol esters, sugar, and the components of a well-balanced, healthy diet.   Nutrition II class: Lifestyle Skills:  -Group instruction provided by PowerPoint slides, verbal discussion, and written materials to support subject matter. The instructor gives an explanation and review of label reading, grocery shopping for heart health, heart healthy recipe modifications, and ways to make healthier choices when eating out.   Diabetes Question & Answer:  -Group instruction provided by PowerPoint slides, verbal discussion, and written materials to support subject matter. The instructor gives an explanation and review of diabetes co-morbidities, pre- and post-prandial blood glucose goals, pre-exercise blood glucose goals, signs, symptoms, and treatment of hypoglycemia and hyperglycemia, and foot care basics.   Diabetes Blitz:  -Group instruction provided by PowerPoint slides, verbal discussion, and written materials to support subject matter. The instructor gives an explanation and review of the physiology behind type 1 and type 2 diabetes, diabetes medications and rational behind using different medications, pre- and post-prandial blood glucose recommendations and Hemoglobin A1c goals, diabetes diet, and exercise including blood glucose guidelines for exercising safely.    Portion Distortion:  -Group instruction provided by PowerPoint slides, verbal discussion, written materials, and food models to support subject matter. The instructor gives an explanation of serving size versus portion size, changes in portions sizes over the last 20 years, and what consists of a serving from each food  group.   Stress Management:  -Group instruction provided by verbal instruction, video, and written materials to support subject matter.  Instructors review role of stress in heart disease and how to cope with stress positively.     Exercising on Your Own:  -Group instruction provided by verbal instruction, power point, and written materials to support subject.  Instructors discuss benefits of exercise, components of exercise, frequency and intensity of exercise, and end points for exercise.  Also discuss use of nitroglycerin and activating EMS.  Review options of places to exercise outside of rehab.  Review guidelines for sex with heart disease.   CARDIAC REHAB PHASE II EXERCISE from 03/21/2018 in [place]  Date  03/21/18  Educator  EP  Instruction Review Code  2- Demonstrated Understanding      Cardiac Drugs I:  -Group instruction provided by verbal instruction and written materials to support subject.  Instructor reviews cardiac drug classes: antiplatelets, anticoagulants, beta blockers, and statins.  Instructor discusses reasons, side effects, and lifestyle considerations for each drug class.   Cardiac Drugs II:  -Group instruction provided by verbal instruction and written materials to support subject.  Instructor reviews cardiac drug classes: angiotensin converting enzyme inhibitors (ACE-I), angiotensin II receptor blockers (ARBs), nitrates, and calcium channel blockers.  Instructor discusses reasons, side effects, and lifestyle considerations for each drug class.  CARDIAC REHAB PHASE II EXERCISE from 03/21/2018 in [place]  Date  03/14/18  Instruction Review Code  2- Demonstrated Understanding      Anatomy and Physiology of the Circulatory System:  Group verbal and written instruction and models provide basic cardiac anatomy and physiology, with the coronary electrical and arterial systems. Review of: AMI, Angina,  Valve disease, Heart Failure, Peripheral Artery Disease, Cardiac Arrhythmia, Pacemakers, and the ICD.   Other Education:  -Group or individual verbal, written, or video instructions that support the educational goals of the cardiac rehab program.   Holiday Eating Survival Tips:  -Group instruction provided by PowerPoint slides, verbal discussion, and written materials to support subject matter. The instructor gives patients tips, tricks, and techniques to help them not only survive but enjoy the holidays despite the onslaught of food that accompanies the holidays.   Knowledge Questionnaire Score: Knowledge Questionnaire Score - 02/08/18 0918      Knowledge Questionnaire Score   Pre Score  14/24       Core Components/Risk Factors/Patient Goals at Admission: Personal Goals and Risk Factors at Admission - 02/08/18 1227      Core Components/Risk Factors/Patient Goals on Admission    Weight Management  Yes;Obesity;Weight Loss    Intervention  Weight Management: Develop a combined nutrition and exercise program designed to reach desired caloric intake, while maintaining appropriate intake of nutrient and fiber, sodium and fats, and appropriate energy expenditure required for the weight goal.;Weight Management: Provide education and appropriate resources to help participant work on and attain dietary goals.;Weight Management/Obesity: Establish reasonable short term and long term weight goals.;Obesity: Provide education and appropriate resources to help participant work on and attain dietary goals.    Admit Weight  396 lb 6.2 oz (179.8 kg)    Goal Weight: Short Term  385 lb (174.6 kg)    Goal Weight: Long Term  376 lb (170.6 kg)    Expected Outcomes  Short Term: Continue to assess and modify interventions until short term weight is achieved;Long Term: Adherence to nutrition and physical activity/exercise program aimed toward attainment of established weight goal;Weight Loss: Understanding of  general recommendations for a balanced deficit meal plan, which promotes 1-2 lb weight loss per week and includes a negative energy balance of [phone] kcal/d;Understanding recommendations for meals to include 15-35% energy as protein, 25-35% energy from fat, 35-60% energy from carbohydrates, less than 249m of dietary cholesterol, 20-35 gm of total fiber daily;Understanding of distribution of calorie intake throughout the day with the consumption of 4-5 meals/snacks    Diabetes  Yes    Intervention  Provide education about signs/symptoms and action to take for hypo/hyperglycemia.;Provide education about proper nutrition, including hydration, and aerobic/resistive exercise prescription along with prescribed medications to achieve blood glucose in normal ranges: Fasting glucose 65-99 mg/dL    Expected Outcomes  Short Term: Participant verbalizes understanding of the signs/symptoms and immediate care of hyper/hypoglycemia, proper foot care and importance of medication, aerobic/resistive exercise and nutrition plan for blood glucose control.;Long Term: Attainment of HbA1C < 7%.    Heart Failure  Yes    Intervention  Provide a combined exercise and nutrition program that is supplemented with education, support and counseling about heart failure. Directed toward relieving symptoms such as shortness of breath, decreased exercise tolerance, and extremity edema.    Expected Outcomes  Improve functional capacity of life;Short term: Attendance in program 2-3 days a week with increased exercise capacity. Reported lower sodium intake. Reported increased fruit and  vegetable intake. Reports medication compliance.;Short term: Daily weights obtained and reported for increase. Utilizing diuretic protocols set by physician.;Long term: Adoption of self-care skills and reduction of barriers for early signs and symptoms recognition and intervention leading to self-care maintenance.    Hypertension  Yes    Intervention  Monitor  prescription use compliance.;Provide education on lifestyle modifcations including regular physical activity/exercise, weight management, moderate sodium restriction and increased consumption of fresh fruit, vegetables, and low fat dairy, alcohol moderation, and smoking cessation.    Expected Outcomes  Short Term: Continued assessment and intervention until BP is < 140/86m HG in hypertensive participants. < 130/846mHG in hypertensive participants with diabetes, heart failure or chronic kidney disease.;Long Term: Maintenance of blood pressure at goal levels.    Lipids  Yes    Intervention  Provide education and support for participant on nutrition & aerobic/resistive exercise along with prescribed medications to achieve LDL <7074mHDL >45m53m  Expected Outcomes  Short Term: Participant states understanding of desired cholesterol values and is compliant with medications prescribed. Participant is following exercise prescription and nutrition guidelines.;Long Term: Cholesterol controlled with medications as prescribed, with individualized exercise RX and with personalized nutrition plan. Value goals: LDL < 70mg80mL > 40 mg.       Core Components/Risk Factors/Patient Goals Review:  Goals and Risk Factor Review    [place] Name 02/14/18 1418 03/08/18 1148 04/04/18 1709         Core Components/Risk Factors/Patient Goals Review   Personal Goals Review  Weight Management/Obesity;Lipids;Hypertension;Diabetes;Heart Failure  Weight Management/Obesity;Lipids;Hypertension;Diabetes;Heart Failure  Weight Management/Obesity;Lipids;Hypertension;Diabetes;Heart Failure     Review  Pt with multiple CAD RFs willing to participate in CR exercise.  WandaOllied like to build her heart strength and increase her energy/stamina.  Pt with multiple CAD RFs willing to participate in CR exercise.  WandaCathieorking towards increasing her strength.  She was having issues with maintaining a CBG acceptable for exercise.  Medication  adjustments were made and her CBG has been adequate thus far.  [name] weight has been fluctuating as well.  She uses a monitored scale at home that informs her MDs of this for appropriate management.  Pt with multiple CAD RFs willing to participate in CR exercise.  WandaDeborhorking towards increasing her strength.  WandaStaceyannhad intermittent absences for various reasons.  [name] weight has been fluctuating.  She continues to weight herself at home and is called by MD office with any weight gain.  [name] CBG level is remaining an adequate level for exercise.     Expected Outcomes  WandaZiaire participate in CR exericise, nutrition, and lifestlye modification opportunities.   WandaMikki participate in CR exericise, nutrition, and lifestlye modification opportunities. CBG will remain adequate for exercise. WandaYuleni continue to weigh herself everyday.   WandaCarmina participate in CR exericise, nutrition, and lifestlye modification opportunities. CBG will remain adequate for exercise. WandaBevely continue to weigh herself everyday.         Core Components/Risk Factors/Patient Goals at Discharge (Final Review):  Goals and Risk Factor Review - 04/04/18 1709      Core Components/Risk Factors/Patient Goals Review   Personal Goals Review  Weight Management/Obesity;Lipids;Hypertension;Diabetes;Heart Failure    Review  Pt with multiple CAD RFs willing to participate in CR exercise.  [name] towards increasing her strength.  [name] intermittent absences for various reasons.  [name] weight has been fluctuating.  She continues to weight herself at home and is  called by MD office with any weight gain.  [name] CBG level is remaining an adequate level for exercise.    Expected Outcomes  Veyda will participate in CR exericise, nutrition, and lifestlye modification opportunities. CBG will remain adequate for exercise. [name] will continue to weigh herself everyday.        ITP Comments: ITP Comments     Row Name 02/07/18 1536 02/14/18 1417 03/08/18 1145 04/04/18 1707     ITP Comments  Dr. Fransico Him, Medical Director  Mariann Laster started exercise today and tolerated it well.  30 Day ITP Review. Jessia has been doing well with exercise.  She has missed some sessions due to sickness and low CBGs.   30 Day ITP Review.  Alliene has continued to have intermittent absences for various reasons.  Her CBG has normalized for exercise but her weight is fluctuating.        Comments: See ITP Comments.

## 2018-04-06 ENCOUNTER — Encounter (HOSPITAL_COMMUNITY): Payer: Medicaid Other

## 2018-04-09 ENCOUNTER — Encounter (HOSPITAL_COMMUNITY)
Admission: RE | Admit: 2018-04-09 | Discharge: 2018-04-09 | Disposition: A | Discharge status: Home / Self Care | Payer: Medicaid Other | Source: Ambulatory Visit | Attending: Cardiology | Admitting: Cardiology

## 2018-04-09 ENCOUNTER — Encounter (HOSPITAL_COMMUNITY): Payer: Medicaid Other

## 2018-04-09 DIAGNOSIS — Z79891 Long term (current) use of opiate analgesic: Secondary | ICD-10-CM | POA: Diagnosis not present

## 2018-04-09 DIAGNOSIS — K219 Gastro-esophageal reflux disease without esophagitis: Secondary | ICD-10-CM | POA: Insufficient documentation

## 2018-04-09 DIAGNOSIS — Z8041 Family history of malignant neoplasm of ovary: Secondary | ICD-10-CM | POA: Diagnosis not present

## 2018-04-09 DIAGNOSIS — N179 Acute kidney failure, unspecified: Secondary | ICD-10-CM | POA: Insufficient documentation

## 2018-04-09 DIAGNOSIS — Z79818 Long term (current) use of other agents affecting estrogen receptors and estrogen levels: Secondary | ICD-10-CM | POA: Insufficient documentation

## 2018-04-09 DIAGNOSIS — M329 Systemic lupus erythematosus, unspecified: Secondary | ICD-10-CM | POA: Insufficient documentation

## 2018-04-09 DIAGNOSIS — F419 Anxiety disorder, unspecified: Secondary | ICD-10-CM | POA: Diagnosis not present

## 2018-04-09 DIAGNOSIS — E1143 Type 2 diabetes mellitus with diabetic autonomic (poly)neuropathy: Secondary | ICD-10-CM | POA: Diagnosis not present

## 2018-04-09 DIAGNOSIS — Z9981 Dependence on supplemental oxygen: Secondary | ICD-10-CM | POA: Diagnosis not present

## 2018-04-09 DIAGNOSIS — I110 Hypertensive heart disease with heart failure: Secondary | ICD-10-CM | POA: Diagnosis present

## 2018-04-09 DIAGNOSIS — Z7951 Long term (current) use of inhaled steroids: Secondary | ICD-10-CM | POA: Insufficient documentation

## 2018-04-09 DIAGNOSIS — Z7952 Long term (current) use of systemic steroids: Secondary | ICD-10-CM | POA: Insufficient documentation

## 2018-04-09 DIAGNOSIS — Z7982 Long term (current) use of aspirin: Secondary | ICD-10-CM | POA: Insufficient documentation

## 2018-04-09 DIAGNOSIS — Z7983 Long term (current) use of bisphosphonates: Secondary | ICD-10-CM | POA: Insufficient documentation

## 2018-04-09 DIAGNOSIS — G4733 Obstructive sleep apnea (adult) (pediatric): Secondary | ICD-10-CM | POA: Insufficient documentation

## 2018-04-09 DIAGNOSIS — K3184 Gastroparesis: Secondary | ICD-10-CM | POA: Insufficient documentation

## 2018-04-09 DIAGNOSIS — Z79899 Other long term (current) drug therapy: Secondary | ICD-10-CM | POA: Diagnosis not present

## 2018-04-09 DIAGNOSIS — E7800 Pure hypercholesterolemia, unspecified: Secondary | ICD-10-CM | POA: Diagnosis not present

## 2018-04-09 DIAGNOSIS — Z7902 Long term (current) use of antithrombotics/antiplatelets: Secondary | ICD-10-CM | POA: Insufficient documentation

## 2018-04-09 DIAGNOSIS — Z791 Long term (current) use of non-steroidal anti-inflammatories (NSAID): Secondary | ICD-10-CM | POA: Diagnosis not present

## 2018-04-09 DIAGNOSIS — Z792 Long term (current) use of antibiotics: Secondary | ICD-10-CM | POA: Insufficient documentation

## 2018-04-09 DIAGNOSIS — Z7901 Long term (current) use of anticoagulants: Secondary | ICD-10-CM | POA: Diagnosis not present

## 2018-04-09 DIAGNOSIS — I5022 Chronic systolic (congestive) heart failure: Secondary | ICD-10-CM | POA: Insufficient documentation

## 2018-04-09 DIAGNOSIS — Z803 Family history of malignant neoplasm of breast: Secondary | ICD-10-CM | POA: Insufficient documentation

## 2018-04-09 DIAGNOSIS — Z794 Long term (current) use of insulin: Secondary | ICD-10-CM | POA: Diagnosis not present

## 2018-04-11 ENCOUNTER — Encounter (HOSPITAL_COMMUNITY)
Admission: RE | Admit: 2018-04-11 | Discharge: 2018-04-11 | Disposition: A | Discharge status: Home / Self Care | Payer: Medicaid Other | Source: Ambulatory Visit | Attending: Cardiology | Admitting: Cardiology

## 2018-04-11 ENCOUNTER — Encounter (HOSPITAL_COMMUNITY): Payer: Medicaid Other

## 2018-04-11 DIAGNOSIS — I5022 Chronic systolic (congestive) heart failure: Secondary | ICD-10-CM

## 2018-04-11 DIAGNOSIS — I110 Hypertensive heart disease with heart failure: Secondary | ICD-10-CM | POA: Diagnosis not present

## 2018-04-13 ENCOUNTER — Encounter (HOSPITAL_COMMUNITY): Payer: Medicaid Other

## 2018-04-16 ENCOUNTER — Encounter (HOSPITAL_COMMUNITY)
Admission: RE | Admit: 2018-04-16 | Discharge: 2018-04-16 | Disposition: A | Discharge status: Home / Self Care | Payer: Medicaid Other | Source: Ambulatory Visit | Attending: Cardiology | Admitting: Cardiology

## 2018-04-16 ENCOUNTER — Encounter (HOSPITAL_COMMUNITY): Payer: Medicaid Other

## 2018-04-16 DIAGNOSIS — I110 Hypertensive heart disease with heart failure: Secondary | ICD-10-CM | POA: Diagnosis not present

## 2018-04-16 DIAGNOSIS — I5022 Chronic systolic (congestive) heart failure: Secondary | ICD-10-CM

## 2018-04-18 ENCOUNTER — Ambulatory Visit (INDEPENDENT_AMBULATORY_CARE_PROVIDER_SITE_OTHER): Payer: Medicaid Other

## 2018-04-18 ENCOUNTER — Encounter (HOSPITAL_COMMUNITY): Payer: Medicaid Other

## 2018-04-18 ENCOUNTER — Ambulatory Visit (INDEPENDENT_AMBULATORY_CARE_PROVIDER_SITE_OTHER): Payer: Medicaid Other | Admitting: Physician Assistant

## 2018-04-18 ENCOUNTER — Encounter (INDEPENDENT_AMBULATORY_CARE_PROVIDER_SITE_OTHER): Payer: Self-pay | Admitting: Physician Assistant

## 2018-04-18 DIAGNOSIS — M25561 Pain in right knee: Secondary | ICD-10-CM

## 2018-04-18 DIAGNOSIS — M5441 Lumbago with sciatica, right side: Secondary | ICD-10-CM

## 2018-04-18 DIAGNOSIS — G8929 Other chronic pain: Secondary | ICD-10-CM | POA: Diagnosis not present

## 2018-04-18 NOTE — Progress Notes (Signed)
Office Visit Note   Patient: [name]           Date of Birth: 1971/01/25           MRN: [id number] Visit Date: 04/18/2018              Requested by: Bartholome Bill, MD [place], [place] [address] PCP: Bartholome Bill, MD   Assessment & Plan: Visit Diagnoses:  1. Acute right-sided low back pain with right-sided sciatica   2. Chronic pain of right knee     Plan: Patient will continue her Flexeril.  She is also on the low-dose prednisone and which she will continue.  We will send her to formal physical therapy for lumbar exercises home exercise program stretching modalities.  Follow-up in 4 weeks for pain persist or becomes worse consider MRI to rule out HNP as the source of her pain.  Follow-Up Instructions: Return in about 4 weeks (around 05/16/2018).   Orders:  Orders Placed This Encounter  Procedures  . XR Lumbar Spine 2-3 Views  . XR Knee 1-2 Views Right  . Ambulatory referral to Physical Therapy   No orders of the defined types were placed in this encounter.     Procedures: No procedures performed   Clinical Data: No additional findings.   Subjective: Chief Complaint  Patient presents with  . Lower Back - Pain  . Right Leg - Pain  . Right Knee - Pain    HPI This will is a 47 year old female comes in today with right leg knee pain.  States the pain started about 2 weeks ago.  Pain radiates from her right buttocks down the entire leg to the ankle.  She states this is a constant pain achy in nature.  No numbness tingling.  She notes some weakness of the right leg.  Pain does awaken her at night.  She denies any bowel or bladder dysfunction no saddle anesthesia like symptoms.  She is on prednisone for lupus.  She is been taking some Flexeril which she states really has not helped.  She had no known injury.  She is unable take NSAIDs.  Her cardiac history.  She is diabetic reports her last hemoglobin A1c was 6.4.  She  states that her knee really has not been bothering her since she had a cortisone injection back in March.  She is in cardiac rehab currently.  Review of Systems Please see HPI otherwise negative  Objective: Vital Signs: LMP 03/24/2017   Physical Exam  Constitutional: She is oriented to person, place, and time. She appears well-developed and well-nourished. No distress.  Pulmonary/Chest: Effort normal.  Neurological: She is alert and oriented to person, place, and time.  Skin: She is not diaphoretic.  Psychiatric: She has a normal mood and affect.    Ortho Exam She has 5 out of 5 strength throughout the lower extremities against resistance.  She has a positive straight leg raise on the right negative on the left.  Good range of motion bilateral hips.  Calves are supple nontender.  She has tenderness over the right trochanteric region and over the lower lumbar right paraspinous region. Specialty Comments:  No specialty comments available.  Imaging: Xr Knee 1-2 Views Right  Result Date: 04/18/2018 Right knee 2 views shows moderate to severe medial compartmental narrowing mild narrowing of lateral compartment mild patellofemoral changes.  No acute fractures.  Knee is well located.    PMFS History: Patient  Active Problem List   Diagnosis Date Noted  . Multifocal pneumonia 09/23/2017  . Acute respiratory failure ([place]) 09/21/2017  . CHF exacerbation ([place]) 09/20/2017  . Costochondritis 06/06/2017  . Chest pain 06/04/2017  . Diabetes ([place]) 09/19/2015  . Radiculopathy of cervical spine   . Severe headache 03/05/2014  . Family history of malignant neoplasm of breast   . Family history of malignant neoplasm of ovary   . Intractable nausea and vomiting 11/11/2013  . Septate uterus 09/08/2013  . Diabetic gastroparesis ([place]) 04/28/2013  . Constipation due to pain medication 04/21/2013  . Odynophagia 04/04/2013  . Liver mass 02/28/2013  . Reflux esophagitis 02/26/2013  . Morbid  obesity ([place]) 10/10/2012  . Chronic diastolic CHF (congestive heart failure) ([place]) 10/09/2012  . Chronic respiratory failure with hypoxia ([place]) 10/09/2012  . Clinical polymyositis syndrome ([place]) 09/06/2012  . Mononeuritis of lower limb 01/12/2012  . HTN (hypertension) 11/08/2011  . Systolic CHF, acute on chronic ([place]) 11/08/2011  . HLD (hyperlipidemia) 07/19/2011  . Bell's palsy 08/09/2010  . SLE 08/09/2010  . [place] SYNDROME 08/09/2010   Past Medical History:  Diagnosis Date  . Acute renal failure (HCC)     Intractable nausea vomiting secondary to diabetic gastroparesis causing dehydration and acute renal failure /notes 04/01/2013  . Anginal pain ([place])   . Anxiety   . Bell's palsy 08/09/2010  . CAP (community acquired pneumonia) 10/2011   Archie Endo 11/08/2011  . Chest pain 01/13/2014  . CHF (congestive heart failure) ([place])   . Diabetic gastroparesis associated with type 2 diabetes mellitus ([place])    this is presumed diagnoses, not confirmed by any studies.   . Eczema   . Family history of malignant neoplasm of breast   . Family history of malignant neoplasm of ovary   . GERD (gastroesophageal reflux disease)   . High cholesterol   . Hypertension   . Liver mass 2014   biopsied 03/2013 at [facility], not malignant.  is to undergo radiologic ablation of the mass in sept/October 2014.   . Lupus ([place])   . Migraines    "maybe a couple times/yr" (04/01/2013)  . Obesity   . Obstructive sleep apnea on CPAP 2011  . On home oxygen therapy    "2L 24/7" (02/28/2015)  . SJOGREN'S SYNDROME 08/09/2010  . SLE (systemic lupus erythematosus) ([place])    Archie Endo 12/01/2010  . Stroke [facility]) 2010; 10/2012   "left side is still weak from it, never fully regained full strength; no additions from stroke 10/2012"    Family History  Problem Relation Age of Onset  . Diabetes Mother   . Asthma Mother   . Urticaria Mother   . Breast cancer Maternal Aunt 64       currently 74  . Stomach cancer Maternal Uncle          dx 20s; deceased  . Prostate cancer Maternal Uncle        deceased 59s  . Cancer Maternal Aunt        unk. primary  . Cancer Maternal Aunt        unk. primary  . Ovarian cancer Maternal Aunt        deceased 87s  . Hypertension Other   . Stroke Other   . Arthritis Other   . Ovarian cancer Sister 74       maternal half-sister; RAD51C positive  . Cancer Paternal Aunt        unk. primary; deceased 34s  . Prostate cancer Paternal Uncle  deceased 79s  . Breast cancer Cousin        deceased 35s; daughter of mat uncle with stomach ca  . Breast cancer Maternal Aunt        deceased 65s  . Asthma Brother   . Asthma Brother   . Allergic rhinitis Neg Hx   . Angioedema Neg Hx     Past Surgical History:  Procedure Laterality Date  . BREAST CYST EXCISION Left 08/2005   epidermoid  . CARDIAC CATHETERIZATION  02/07/12  . ECTOPIC PREGNANCY SURGERY  1999  . ESOPHAGOGASTRODUODENOSCOPY N/A 02/26/2013   Procedure: ESOPHAGOGASTRODUODENOSCOPY (EGD);  Surgeon: Irene Shipper, MD;  Location: [facility] ENDOSCOPY;  Service: Endoscopy;  Laterality: N/A;  . ESOPHAGOGASTRODUODENOSCOPY N/A 03/02/2015   Procedure: ESOPHAGOGASTRODUODENOSCOPY (EGD);  Surgeon: Milus Banister, MD;  Location: [place];  Service: Endoscopy;  Laterality: N/A;  . HERNIA REPAIR    . LEFT AND RIGHT HEART CATHETERIZATION WITH CORONARY ANGIOGRAM N/A 02/07/2012   Procedure: LEFT AND RIGHT HEART CATHETERIZATION WITH CORONARY ANGIOGRAM;  Surgeon: Laverda Page, MD;  Location: [facility] CATH LAB;  Service: Cardiovascular;  Laterality: N/A;  . LIVER BIOPSY  03/2013   liver mass/medical hx noted above  . MUSCLE BIOPSY     for lupus/notes 12/01/2010  . UMBILICAL HERNIA REPAIR  1980's   Social History   Occupational History    Employer: UNEMPLOYED    Comment: student  Tobacco Use  . Smoking status: Former Smoker    Years: 3.00    Types: Cigarettes    Last attempt to quit: 06/01/1990    Years since quitting: 27.8  . Smokeless tobacco:  Never Used  Substance and Sexual Activity  . Alcohol use: No    Alcohol/week: 0.0 standard drinks  . Drug use: No  . Sexual activity: Yes    Partners: Male    Birth control/protection: None

## 2018-04-20 ENCOUNTER — Encounter (HOSPITAL_COMMUNITY): Payer: Medicaid Other

## 2018-04-20 ENCOUNTER — Encounter (HOSPITAL_COMMUNITY)
Admission: RE | Admit: 2018-04-20 | Discharge: 2018-04-20 | Disposition: A | Discharge status: Home / Self Care | Payer: Medicaid Other | Source: Ambulatory Visit | Attending: Cardiology | Admitting: Cardiology

## 2018-04-20 ENCOUNTER — Telehealth (INDEPENDENT_AMBULATORY_CARE_PROVIDER_SITE_OTHER): Payer: Self-pay | Admitting: Physician Assistant

## 2018-04-20 DIAGNOSIS — I110 Hypertensive heart disease with heart failure: Secondary | ICD-10-CM | POA: Diagnosis not present

## 2018-04-20 DIAGNOSIS — M5430 Sciatica, unspecified side: Secondary | ICD-10-CM | POA: Insufficient documentation

## 2018-04-20 DIAGNOSIS — I5022 Chronic systolic (congestive) heart failure: Secondary | ICD-10-CM

## 2018-04-20 MED ORDER — METHYLPREDNISOLONE 4 MG PO TABS: ORAL_TABLET | ORAL | 0 refills | Status: DC

## 2018-04-20 NOTE — Telephone Encounter (Signed)
Patient is requesting a rx for prednisone to help with inflammation be sent to [facility] on [address], patient states he physical therapy is not starting until 10/1. She has tried a warm compress but is still in a lot of pain.  # [phone]

## 2018-04-20 NOTE — Telephone Encounter (Signed)
Please advise 

## 2018-04-23 ENCOUNTER — Encounter (HOSPITAL_COMMUNITY)
Admission: RE | Admit: 2018-04-23 | Discharge: 2018-04-23 | Disposition: A | Discharge status: Home / Self Care | Payer: Medicaid Other | Source: Ambulatory Visit | Attending: Cardiology | Admitting: Cardiology

## 2018-04-23 ENCOUNTER — Encounter (HOSPITAL_COMMUNITY): Payer: Medicaid Other

## 2018-04-23 DIAGNOSIS — I5022 Chronic systolic (congestive) heart failure: Secondary | ICD-10-CM

## 2018-04-23 DIAGNOSIS — I110 Hypertensive heart disease with heart failure: Secondary | ICD-10-CM | POA: Diagnosis not present

## 2018-04-24 ENCOUNTER — Other Ambulatory Visit (INDEPENDENT_AMBULATORY_CARE_PROVIDER_SITE_OTHER): Payer: Self-pay

## 2018-04-24 DIAGNOSIS — G8929 Other chronic pain: Secondary | ICD-10-CM

## 2018-04-24 DIAGNOSIS — M5442 Lumbago with sciatica, left side: Principal | ICD-10-CM

## 2018-04-25 ENCOUNTER — Encounter (HOSPITAL_COMMUNITY): Payer: Medicaid Other

## 2018-04-25 ENCOUNTER — Other Ambulatory Visit: Payer: Self-pay | Admitting: Endocrinology

## 2018-04-27 ENCOUNTER — Encounter (HOSPITAL_COMMUNITY): Payer: Medicaid Other

## 2018-04-30 ENCOUNTER — Encounter (HOSPITAL_COMMUNITY): Payer: Medicaid Other

## 2018-04-30 ENCOUNTER — Telehealth (HOSPITAL_COMMUNITY): Payer: Self-pay | Admitting: Family Medicine

## 2018-05-01 ENCOUNTER — Ambulatory Visit: Payer: Medicaid Other | Attending: Physician Assistant | Admitting: Physical Therapy

## 2018-05-01 DIAGNOSIS — G8929 Other chronic pain: Secondary | ICD-10-CM | POA: Insufficient documentation

## 2018-05-01 DIAGNOSIS — M5442 Lumbago with sciatica, left side: Secondary | ICD-10-CM | POA: Diagnosis present

## 2018-05-01 DIAGNOSIS — M6281 Muscle weakness (generalized): Secondary | ICD-10-CM | POA: Diagnosis present

## 2018-05-01 DIAGNOSIS — M62838 Other muscle spasm: Secondary | ICD-10-CM

## 2018-05-01 NOTE — Patient Instructions (Signed)
Self trigger point release with ball on wall in Rt buttocks

## 2018-05-01 NOTE — Therapy (Signed)
[place], [place], [address] Phone: [phone]   Fax:  [phone]  Physical Therapy Evaluation  Patient Details  Name: [name] MRN: [id number] Date of Birth: 07/27/1971 Referring Provider (PT): Dr Zollie Beckers   Encounter Date: 05/01/2018  PT End of Session - 05/01/18 0852    Visit Number  1    Number of Visits  4    Date for PT Re-Evaluation  05/22/18    PT Start Time  0852    PT Stop Time  0925    PT Time Calculation (min)  33 min    Activity Tolerance  Patient tolerated treatment well       Past Medical History:  Diagnosis Date  . Acute renal failure (HCC)     Intractable nausea vomiting secondary to diabetic gastroparesis causing dehydration and acute renal failure /notes 04/01/2013  . Anginal pain ([place])   . Anxiety   . Bell's palsy 08/09/2010  . CAP (community acquired pneumonia) 10/2011   Archie Endo 11/08/2011  . Chest pain 01/13/2014  . CHF (congestive heart failure) ([place])   . Diabetic gastroparesis associated with type 2 diabetes mellitus ([place])    this is presumed diagnoses, not confirmed by any studies.   . Eczema   . Family history of malignant neoplasm of breast   . Family history of malignant neoplasm of ovary   . GERD (gastroesophageal reflux disease)   . High cholesterol   . Hypertension   . Liver mass 2014   biopsied 03/2013 at [facility], not malignant.  is to undergo radiologic ablation of the mass in sept/October 2014.   . Lupus ([place])   . Migraines    "maybe a couple times/yr" (04/01/2013)  . Obesity   . Obstructive sleep apnea on CPAP 2011  . On home oxygen therapy    "2L 24/7" (02/28/2015)  . SJOGREN'S SYNDROME 08/09/2010  . SLE (systemic lupus erythematosus) ([place])    Archie Endo 12/01/2010  . Stroke [facility]) 2010; 10/2012   "left side is still weak from it, never fully regained full strength; no additions from stroke 10/2012"    Past Surgical History:  Procedure Laterality  Date  . BREAST CYST EXCISION Left 08/2005   epidermoid  . CARDIAC CATHETERIZATION  02/07/12  . ECTOPIC PREGNANCY SURGERY  1999  . ESOPHAGOGASTRODUODENOSCOPY N/A 02/26/2013   Procedure: ESOPHAGOGASTRODUODENOSCOPY (EGD);  Surgeon: Irene Shipper, MD;  Location: [facility] ENDOSCOPY;  Service: Endoscopy;  Laterality: N/A;  . ESOPHAGOGASTRODUODENOSCOPY N/A 03/02/2015   Procedure: ESOPHAGOGASTRODUODENOSCOPY (EGD);  Surgeon: Milus Banister, MD;  Location: [place];  Service: Endoscopy;  Laterality: N/A;  . HERNIA REPAIR    . LEFT AND RIGHT HEART CATHETERIZATION WITH CORONARY ANGIOGRAM N/A 02/07/2012   Procedure: LEFT AND RIGHT HEART CATHETERIZATION WITH CORONARY ANGIOGRAM;  Surgeon: Laverda Page, MD;  Location: [facility] CATH LAB;  Service: Cardiovascular;  Laterality: N/A;  . LIVER BIOPSY  03/2013   liver mass/medical hx noted above  . MUSCLE BIOPSY     for lupus/notes 12/01/2010  . UMBILICAL HERNIA REPAIR  1980's    There were no vitals filed for this visit.   Subjective Assessment - 05/01/18 0854    Subjective  Pt reports she has had low back pain on/off, then about 3 wks ago she woke up and could barely walk so she went to the MD.  Was on prednisone and had temporary relief, so she was referred to PT     How long can  you sit comfortably?  no limitations    How long can you stand comfortably?  immediate pain    How long can you walk comfortably?  immediate pain with walking, Lt LE has given out on her twice in the last week - no falls     Diagnostic tests  x-ray - of leg and knee and he thinks it is all from the back.     Patient Stated Goals  get rid of pain so she can be more active and do her normal activities around the house    Currently in Pain?  No/denies   because she is sitting   Pain Score  9    with weightbearing   Pain Location  Leg    Pain Orientation  Right    Pain Descriptors / Indicators  Aching;Sharp    Pain Type  Acute pain    Pain Radiating Towards  from buttocks to foot at times     Pain Onset  1 to 4 weeks ago    Pain Frequency  Intermittent    Aggravating Factors   walking and all weightbearing    Pain Relieving Factors  ice, biofreeeze, heat, ace wrap         [facility] PT Assessment - 05/01/18 0001      Assessment   Medical Diagnosis  Low back pain with Rt radiculopathy    Referring Provider (PT)  Dr Zollie Beckers    Onset Date/Surgical Date  04/10/18    Hand Dominance  Right    Next MD Visit  after PT    Prior Therapy  none      Precautions   Precautions  None      Balance Screen   Has the patient fallen in the past 6 months  No      [place] residence    Home Access  Stairs to enter   having to take one step at at time   Home Layout  One level      Prior Function   Level of [place]  Unemployed    Leisure  [place], [place] and watch nieces/nephews play      Posture/Postural Control   Posture/Postural Control  Postural limitations    Postural Limitations  Weight shift left;Forward head;Rounded Shoulders   obesity     ROM / Strength   AROM / PROM / Strength  AROM;Strength      AROM   AROM Assessment Site  Lumbar;Hip    Lumbar Flexion  to floor    Lumbar Extension  WNL with some low back tightness    Lumbar - Right Rotation  WNl    Lumbar - Left Rotation  WNL      Strength   Strength Assessment Site  Hip;Knee;Ankle;Lumbar    Right/Left Hip  Right   Lt WNL   Right Hip Flexion  5/5    Right Hip Extension  4/5    Right Hip ABduction  4/5    Right/Left Knee  --   Lt WNl, Rt grossly 4+/5   Right/Left Ankle  --   WNL   Lumbar Extension  --   multifidi poor     Flexibility   Soft Tissue Assessment /Muscle Length  --   LE's limited d/t soft tissue approximation     Palpation   Spinal mobility  lumbar grossly WNL, some tenderness At L4CPA and Rt UPA  Palpation comment  tight and pain in Rt gluts, piriformis, slight tenderness in Rt QL and lumbar  paraspinals       Special Tests   Other special tests  (-) slump and SLR RT                 Objective measurements completed on examination: See above findings.      [place] Adult PT Treatment/Exercise - 05/01/18 0001      Self-Care   Self-Care  Other Self-Care Comments    Other Self-Care Comments   self trigger point release with ball on wall into Rt buttocks.       Exercises   Exercises  Lumbar      Lumbar Exercises: Stretches   Single Knee to Chest Stretch  Right;2 reps;30 seconds   with sheet under knee   Piriformis Stretch  Right;2 reps;30 seconds   seated             PT Education - 05/01/18 0933    Education Details  HEP and self trigger point release    Person(s) Educated  Patient    Methods  Explanation;Demonstration;Handout    Comprehension  Returned demonstration;Verbalized understanding       PT Short Term Goals - 05/01/18 1100      PT SHORT TERM GOAL #1   Title  I with inital HEP and self trigger point release to Rt buttocks    Baseline  not performing any exericse    Time  3    Period  Weeks    Status  New    Target Date  05/22/18      PT SHORT TERM GOAL #2   Title  report =/> 50% reduction in pain to allow for equal wbing on LE's    Baseline  shifts wt to the Lt and has up to 9/10 pain    Time  3    Period  Weeks    Status  New    Target Date  05/22/18      PT SHORT TERM GOAL #3   Title  demo safe technique to engage deep core muscles for exercise    Baseline  unable to palpate TA or multifidi activation    Time  3    Period  Weeks    Status  New    Target Date  05/22/18        PT Long Term Goals - 05/01/18 1245      PT LONG TERM GOAL #1   Title  to be determined at reassessment    Time  3    Period  Weeks    Status  New    Target Date  05/22/18             Plan - 05/01/18 0933    Clinical Impression Statement  47 yo female with flare up of Rt buttock and LE pain about 3 weeks ago.  She has some relief with  prednisone dose pack however pain returned and she has now been referred to PT.  She has significant tightness and tenderness in Rt gluts and piriformis.  She has some tenderness into the Rt QL and lumbar paraspinals.  She has limited WBing into the Rt LE due to pain and core weakness.  Aiya is obese and she has limited hip motion due to soft tissue.  She would benefit from PT to decreased muscular tightness, reduce pain and improve core strength to reduce risk of reinjury.  Clinical Presentation  Stable    Clinical Decision Making  Low    Rehab Potential  Good    PT Frequency  2x / week    PT Duration  3 weeks    PT Treatment/Interventions  Dry needling;Manual techniques;Moist Heat;Ultrasound;Patient/family education;Cryotherapy;Electrical Stimulation;Therapeutic exercise    PT Next Visit Plan  manual work Rt buttocks/low back PRN, modalities PRN, core strength and hip stretching    Consulted and Agree with Plan of Care  Patient       Patient will benefit from skilled therapeutic intervention in order to improve the following deficits and impairments:  Pain, Increased muscle spasms, Impaired tone, Decreased strength, Obesity  Visit Diagnosis: Chronic left-sided low back pain with left-sided sciatica - Plan: PT plan of care cert/re-cert  Other muscle spasm - Plan: PT plan of care cert/re-cert  Muscle weakness (generalized) - Plan: PT plan of care cert/re-cert     Problem List Patient Active Problem List   Diagnosis Date Noted  . Multifocal pneumonia 09/23/2017  . Acute respiratory failure ([place]) 09/21/2017  . CHF exacerbation ([place]) 09/20/2017  . Costochondritis 06/06/2017  . Chest pain 06/04/2017  . Diabetes ([place]) 09/19/2015  . Radiculopathy of cervical spine   . Severe headache 03/05/2014  . Family history of malignant neoplasm of breast   . Family history of malignant neoplasm of ovary   . Intractable nausea and vomiting 11/11/2013  . Septate uterus 09/08/2013  . Diabetic  gastroparesis ([place]) 04/28/2013  . Constipation due to pain medication 04/21/2013  . Odynophagia 04/04/2013  . Liver mass 02/28/2013  . Reflux esophagitis 02/26/2013  . Morbid obesity ([place]) 10/10/2012  . Chronic diastolic CHF (congestive heart failure) ([place]) 10/09/2012  . Chronic respiratory failure with hypoxia ([place]) 10/09/2012  . Clinical polymyositis syndrome ([place]) 09/06/2012  . Mononeuritis of lower limb 01/12/2012  . HTN (hypertension) 11/08/2011  . Systolic CHF, acute on chronic ([place]) 11/08/2011  . HLD (hyperlipidemia) 07/19/2011  . Bell's palsy 08/09/2010  . SLE 08/09/2010  . [facility] SYNDROME 08/09/2010    Jeral Pinch PT  05/01/2018, 12:47 PM  [facility] [address] [place], [place], [address] Phone: [phone]   Fax:  [phone]  Name: [name] MRN: [id number] Date of Birth: Nov 20, 1970

## 2018-05-02 ENCOUNTER — Encounter (HOSPITAL_COMMUNITY): Payer: Medicaid Other

## 2018-05-02 ENCOUNTER — Encounter (HOSPITAL_COMMUNITY)
Admission: RE | Admit: 2018-05-02 | Discharge: 2018-05-02 | Disposition: A | Discharge status: Home / Self Care | Payer: Medicaid Other | Source: Ambulatory Visit | Attending: Cardiology | Admitting: Cardiology

## 2018-05-02 VITALS — Ht 68.74 in | Wt 391.1 lb

## 2018-05-02 DIAGNOSIS — M329 Systemic lupus erythematosus, unspecified: Secondary | ICD-10-CM | POA: Insufficient documentation

## 2018-05-02 DIAGNOSIS — I110 Hypertensive heart disease with heart failure: Secondary | ICD-10-CM | POA: Insufficient documentation

## 2018-05-02 DIAGNOSIS — Z794 Long term (current) use of insulin: Secondary | ICD-10-CM | POA: Insufficient documentation

## 2018-05-02 DIAGNOSIS — K219 Gastro-esophageal reflux disease without esophagitis: Secondary | ICD-10-CM | POA: Insufficient documentation

## 2018-05-02 DIAGNOSIS — Z7983 Long term (current) use of bisphosphonates: Secondary | ICD-10-CM | POA: Insufficient documentation

## 2018-05-02 DIAGNOSIS — K3184 Gastroparesis: Secondary | ICD-10-CM | POA: Diagnosis not present

## 2018-05-02 DIAGNOSIS — Z7902 Long term (current) use of antithrombotics/antiplatelets: Secondary | ICD-10-CM | POA: Insufficient documentation

## 2018-05-02 DIAGNOSIS — Z792 Long term (current) use of antibiotics: Secondary | ICD-10-CM | POA: Insufficient documentation

## 2018-05-02 DIAGNOSIS — Z79818 Long term (current) use of other agents affecting estrogen receptors and estrogen levels: Secondary | ICD-10-CM | POA: Diagnosis not present

## 2018-05-02 DIAGNOSIS — E7800 Pure hypercholesterolemia, unspecified: Secondary | ICD-10-CM | POA: Insufficient documentation

## 2018-05-02 DIAGNOSIS — Z79899 Other long term (current) drug therapy: Secondary | ICD-10-CM | POA: Diagnosis not present

## 2018-05-02 DIAGNOSIS — Z791 Long term (current) use of non-steroidal anti-inflammatories (NSAID): Secondary | ICD-10-CM | POA: Diagnosis not present

## 2018-05-02 DIAGNOSIS — I5022 Chronic systolic (congestive) heart failure: Secondary | ICD-10-CM | POA: Diagnosis present

## 2018-05-02 DIAGNOSIS — N179 Acute kidney failure, unspecified: Secondary | ICD-10-CM | POA: Diagnosis not present

## 2018-05-02 DIAGNOSIS — Z7951 Long term (current) use of inhaled steroids: Secondary | ICD-10-CM | POA: Insufficient documentation

## 2018-05-02 DIAGNOSIS — Z9981 Dependence on supplemental oxygen: Secondary | ICD-10-CM | POA: Diagnosis not present

## 2018-05-02 DIAGNOSIS — Z8041 Family history of malignant neoplasm of ovary: Secondary | ICD-10-CM | POA: Diagnosis not present

## 2018-05-02 DIAGNOSIS — G4733 Obstructive sleep apnea (adult) (pediatric): Secondary | ICD-10-CM | POA: Insufficient documentation

## 2018-05-02 DIAGNOSIS — Z7982 Long term (current) use of aspirin: Secondary | ICD-10-CM | POA: Insufficient documentation

## 2018-05-02 DIAGNOSIS — Z7901 Long term (current) use of anticoagulants: Secondary | ICD-10-CM | POA: Insufficient documentation

## 2018-05-02 DIAGNOSIS — E1143 Type 2 diabetes mellitus with diabetic autonomic (poly)neuropathy: Secondary | ICD-10-CM | POA: Insufficient documentation

## 2018-05-02 DIAGNOSIS — Z79891 Long term (current) use of opiate analgesic: Secondary | ICD-10-CM | POA: Diagnosis not present

## 2018-05-02 DIAGNOSIS — F419 Anxiety disorder, unspecified: Secondary | ICD-10-CM | POA: Insufficient documentation

## 2018-05-02 DIAGNOSIS — Z803 Family history of malignant neoplasm of breast: Secondary | ICD-10-CM | POA: Insufficient documentation

## 2018-05-02 DIAGNOSIS — Z7952 Long term (current) use of systemic steroids: Secondary | ICD-10-CM | POA: Diagnosis not present

## 2018-05-03 ENCOUNTER — Other Ambulatory Visit: Payer: Self-pay | Admitting: Cardiology

## 2018-05-03 ENCOUNTER — Encounter (HOSPITAL_COMMUNITY): Payer: Self-pay

## 2018-05-03 DIAGNOSIS — I509 Heart failure, unspecified: Secondary | ICD-10-CM

## 2018-05-03 NOTE — Progress Notes (Signed)
Cardiac Individual Treatment Plan  Patient Details  Name: CATHYANN KILFOYLE MRN: [id number] Date of Birth: 12/22/1970 Referring Provider:     CARDIAC REHAB PHASE II ORIENTATION from 02/08/2018 in [place]  Referring Provider  Adrian Prows MD       Initial Encounter Date:    CARDIAC REHAB PHASE II ORIENTATION from 02/08/2018 in [place]  Date  02/08/18      Visit Diagnosis: Chronic systolic congestive heart failure ([place])  Patient's Home Medications on Admission:  Current Outpatient Medications:  .  ACCU-CHEK AVIVA PLUS test strip, USE TO CHECK BLOOD SUGAR THREE TIMES A DAY, Disp: 300 each, Rfl: 0 .  ACCU-CHEK SOFTCLIX LANCETS lancets, USE TO TEST BLOOD SUGAR THREE TIMES A DAY, Disp: 300 each, Rfl: 0 .  albuterol (PROAIR HFA) 108 (90 BASE) MCG/ACT inhaler, Inhale 2 puffs into the lungs every 6 (six) hours as needed for wheezing or shortness of breath. , Disp: , Rfl:  .  albuterol (PROVENTIL) (2.5 MG/3ML) 0.083% nebulizer solution, Take 2.5 mg by nebulization every 6 (six) hours as needed for wheezing., Disp: , Rfl:  .  ALPRAZolam (XANAX) 0.5 MG tablet, Take 0.5 mg 2 (two) times daily as needed by mouth for anxiety or sleep. , Disp: , Rfl:  .  aspirin EC 81 MG tablet, Take 81 mg by mouth daily., Disp: , Rfl:  .  azaTHIOprine (IMURAN) 50 MG tablet, Take 100 mg by mouth 2 (two) times daily. , Disp: , Rfl:  .  Blood Glucose Monitoring Suppl (ACCU-CHEK AVIVA) device, Use as instructed, Disp: 1 each, Rfl: 0 .  buPROPion (WELLBUTRIN) 100 MG tablet, Take 100 mg by mouth every morning., Disp: , Rfl:  .  carvedilol (COREG) 12.5 MG tablet, Take 3 tablets (37.5 mg total) 2 (two) times daily with a meal by mouth. (Patient taking differently: Take 18.7 mg by mouth 2 (two) times daily with a meal. ), Disp: 180 tablet, Rfl: 0 .  cetirizine (ZYRTEC) 10 MG tablet, Take 10 mg by mouth daily as needed for allergies. , Disp: , Rfl:  .  clopidogrel  (PLAVIX) 75 MG tablet, Take 75 mg by mouth daily., Disp: , Rfl:  .  clotrimazole-betamethasone (LOTRISONE) cream, Apply 1 application daily as needed topically (itching/breakouts on feet). , Disp: , Rfl:  .  cyclobenzaprine (FLEXERIL) 10 MG tablet, Take 10 mg 2 (two) times daily as needed by mouth. , Disp: , Rfl:  .  cycloSPORINE (RESTASIS) 0.05 % ophthalmic emulsion, Place 1 drop into both eyes 3 (three) times daily., Disp: , Rfl:  .  diclofenac sodium (VOLTAREN) 1 % GEL, Apply 2-4 g 4 (four) times daily as needed topically (joint pain)., Disp: , Rfl:  .  doxepin (SINEQUAN) 50 MG capsule, Take 50-100 mg by mouth at bedtime as needed (for insomnia). , Disp: , Rfl:  .  DULoxetine (CYMBALTA) 60 MG capsule, Take 60 mg by mouth at bedtime., Disp: , Rfl:  .  etonogestrel-ethinyl estradiol (NUVARING) 0.12-0.015 MG/24HR vaginal ring, Place 1 each every 28 (twenty-eight) days vaginally. Insert vaginally and leave in place for 3 consecutive weeks, then remove for 1 week., Disp: , Rfl:  .  exenatide (BYETTA) 10 MCG/0.04ML SOPN injection, Inject 0.04 mLs (10 mcg total) into the skin 2 (two) times daily with a meal. And pen needles 2/day (Patient taking differently: Inject 10 mcg into the skin 2 (two) times daily with a meal. ), Disp: 2.4 mL, Rfl: 11 .  famotidine (PEPCID) 20 MG tablet, Take 20 mg by mouth 2 (two) times daily., Disp: , Rfl:  .  glucose blood (ACCU-CHEK AVIVA PLUS) test strip, Used to check blood sugar 5x daily., Disp: 450 each, Rfl: 2 .  glucose blood (ACCU-CHEK GUIDE) test strip, Used to check blood sugars 5x daily., Disp: 200 each, Rfl: 12 .  hydrALAZINE (APRESOLINE) 50 MG tablet, Take 50 mg by mouth 3 (three) times daily., Disp: , Rfl:  .  hydrochlorothiazide (HYDRODIURIL) 25 MG tablet, Take 25 mg by mouth daily., Disp: , Rfl:  .  hydroxychloroquine (PLAQUENIL) 200 MG tablet, Take 200 mg by mouth 2 (two) times daily. , Disp: , Rfl:  .  insulin NPH Human (HUMULIN N) 100 UNIT/ML injection,  Inject 2 mLs (200 Units total) into the skin every morning. And syringes 3/day (Patient taking differently: Inject 200 Units into the skin every morning. ), Disp: 70 mL, Rfl: 11 .  Insulin Pen Needle 32G X 4 MM MISC, Used to inject insulin 1x daily., Disp: 100 each, Rfl: 5 .  Insulin Syringe-Needle U-100 (COMFORT ASSIST INSULIN SYRINGE) 31G X 5/16" 1 ML MISC, Use one daily with insulin, Disp: 90 each, Rfl: 4 .  Lancets (ACCU-CHEK MULTICLIX) lancets, Used to check blood sugars daily up to 5 times., Disp: 204 each, Rfl: 12 .  lidocaine (LIDODERM) 5 %, Place 1 patch onto the skin daily. Remove & Discard patch within 12 hours or as directed by MD, Disp: 30 patch, Rfl: 0 .  magnesium oxide (MAG-OX) 400 MG tablet, Take 400 mg by mouth daily., Disp: , Rfl:  .  megestrol (MEGACE) 40 MG tablet, Take 40 mg by mouth daily., Disp: , Rfl: 3 .  metFORMIN (GLUCOPHAGE-XR) 500 MG 24 hr tablet, Take 1,000 mg by mouth 2 (two) times daily., Disp: , Rfl:  .  methylPREDNISolone (MEDROL) 4 MG tablet, Medrol dose pack. Take as instructed, Disp: 21 tablet, Rfl: 0 .  mometasone-formoterol (DULERA) 100-5 MCG/ACT AERO, Inhale 2 puffs into the lungs 2 (two) times daily., Disp: , Rfl:  .  morphine (MSIR) 15 MG tablet, Take 1 tablet (15 mg total) every 6 (six) hours as needed by mouth for severe pain., Disp: 6 tablet, Rfl: 0 .  Olopatadine HCl (PAZEO) 0.7 % SOLN, Place 1 drop daily as needed into both eyes (itching/irritation). Pazeo, Disp: , Rfl:  .  oxybutynin (DITROPAN-XL) 10 MG 24 hr tablet, Take 10 mg by mouth daily., Disp: , Rfl:  .  Polyvinyl Alcohol-Povidone (REFRESH OP), Place 1 drop 2 (two) times daily into both eyes. , Disp: , Rfl:  .  potassium chloride SA (K-DUR,KLOR-CON) 20 MEQ tablet, Take 20 mEq by mouth daily. , Disp: , Rfl:  .  pravastatin (PRAVACHOL) 10 MG tablet, Take 10 mg by mouth daily., Disp: , Rfl:  .  predniSONE (DELTASONE) 1 MG tablet, Take 3 mg by mouth daily. , Disp: , Rfl:  .  pregabalin (LYRICA) 75  MG capsule, Take 75 mg 3 (three) times daily by mouth. , Disp: , Rfl:  .  sacubitril-valsartan (ENTRESTO) 97-103 MG, Take 1 tablet by mouth 2 (two) times daily., Disp: , Rfl:  .  topiramate (TOPAMAX) 25 MG tablet, Take 75 mg by mouth at bedtime., Disp: , Rfl:  .  torsemide (DEMADEX) 20 MG tablet, 40 mg in AM and 20 mg in PM (Patient taking differently: 20 mg daily. ), Disp: 30 tablet, Rfl: 0 .  traMADol (ULTRAM) 50 MG tablet, TAKE 1 TO 2 TABLETS BY MOUTH  EVERY 6 HOURS AS NEEDED FOR PAIN**PA REQ** (Patient taking differently: Take 100 mg by mouth every 6 (six) hours as needed for moderate pain. ), Disp: 60 tablet, Rfl: 0  Past Medical History: Past Medical History:  Diagnosis Date  . Acute renal failure (HCC)     Intractable nausea vomiting secondary to diabetic gastroparesis causing dehydration and acute renal failure /notes 04/01/2013  . Anginal pain ([place])   . Anxiety   . Bell's palsy 08/09/2010  . CAP (community acquired pneumonia) 10/2011   Archie Endo 11/08/2011  . Chest pain 01/13/2014  . CHF (congestive heart failure) ([place])   . Diabetic gastroparesis associated with type 2 diabetes mellitus ([place])    this is presumed diagnoses, not confirmed by any studies.   . Eczema   . Family history of malignant neoplasm of breast   . Family history of malignant neoplasm of ovary   . GERD (gastroesophageal reflux disease)   . High cholesterol   . Hypertension   . Liver mass 2014   biopsied 03/2013 at [facility], not malignant.  is to undergo radiologic ablation of the mass in sept/October 2014.   . Lupus ([place])   . Migraines    "maybe a couple times/yr" (04/01/2013)  . Obesity   . Obstructive sleep apnea on CPAP 2011  . On home oxygen therapy    "2L 24/7" (02/28/2015)  . SJOGREN'S SYNDROME 08/09/2010  . SLE (systemic lupus erythematosus) ([place])    Archie Endo 12/01/2010  . Stroke [facility]) 2010; 10/2012   "left side is still weak from it, never fully regained full strength; no additions from stroke 10/2012"     Tobacco Use: Social History   Tobacco Use  Smoking Status Former Smoker  . Years: 3.00  . Types: Cigarettes  . Last attempt to quit: 06/01/1990  . Years since quitting: 27.9  Smokeless Tobacco Never Used    Labs: Recent Review Flowsheet Data    Labs for ITP Cardiac and Pulmonary Rehab Latest Ref Rng & Units 09/11/2017 09/20/2017 09/20/2017 11/09/2017 02/21/2018   Cholestrol 0 - 200 mg/dL - - - - -   LDLCALC 0 - 99 mg/dL - - - - -   HDL >39 mg/dL - - - - -   Trlycerides <150 mg/dL - - - - -   Hemoglobin A1c 4.0 - 5.6 % 9.8 - - 6.9 6.4(A)   PHART 7.350 - 7.450 - - 7.324(L) - -   PCO2ART 32.0 - 48.0 mmHg - - 45.4 - -   HCO3 20.0 - 28.0 mmol/L - - 23.6 - -   TCO2 22 - 32 mmol/L - 24 25 - -   ACIDBASEDEF 0.0 - 2.0 mmol/L - - 3.0(H) - -   O2SAT % - - 99.0 - -      Capillary Blood Glucose: Lab Results  Component Value Date   GLUCAP 146 (H) 03/14/2018   GLUCAP 109 (H) 02/28/2018   GLUCAP 124 (H) 02/26/2018   GLUCAP 88 02/26/2018   GLUCAP 129 (H) 02/16/2018     Exercise Target Goals: Exercise Program Goal: Individual exercise prescription set using results from initial 6 min walk test and THRR while considering  patient's activity barriers and safety.   Exercise Prescription Goal: Initial exercise prescription builds to 30-45 minutes a day of aerobic activity, 2-3 days per week.  Home exercise guidelines will be given to patient during program as part of exercise prescription that the participant will acknowledge.  Activity Barriers & Risk Stratification: Activity Barriers &  Cardiac Risk Stratification - 02/08/18 0931      Activity Barriers & Cardiac Risk Stratification   Activity Barriers  Arthritis;Muscular Weakness;Deconditioning;Joint Problems;Shortness of Breath;Back Problems;Other (comment)    Comments  Lupus flare up    Cardiac Risk Stratification  High       6 Minute Walk: 6 Minute Walk    Row Name 02/08/18 1228 05/03/18 0807       6 Minute Walk   Phase   Initial  Discharge Post Walk Test not complete due to Knee Injury    Distance  498 feet  -    Walk Time  6 minutes  -    # of Rest Breaks  0  -    MPH  0.94  -    METS  1.14  -    RPE  14  -    Perceived Dyspnea   2  -    VO2 Peak  4.01  -    Symptoms  Yes (comment)  -    Comments  Moderate SOB +2, Took break for 1 min and 29 secs due to SOB   -    Resting HR  85 bpm  -    Resting BP  120/80  -    Resting Oxygen Saturation   100 %  -    Exercise Oxygen Saturation  during 6 min walk  100 %  -    Max Ex. HR  120 bpm  -    Max Ex. BP  140/80  -    2 Minute Post BP  110/70  -       Oxygen Initial Assessment:   Oxygen Re-Evaluation:   Oxygen Discharge (Final Oxygen Re-Evaluation):   Initial Exercise Prescription: Initial Exercise Prescription - 02/08/18 1200      Date of Initial Exercise RX and Referring Provider   Date  02/08/18    Referring Provider  Adrian Prows MD     Expected Discharge Date  05/13/18      NuStep   Level  2    SPM  75    Minutes  10    METs  1.2      Arm Ergometer   Level  2    Watts  25    Minutes  10    METs  2      Track   Laps  4    Minutes  10    METs  0.9      Prescription Details   Frequency (times per week)  3x    Duration  Progress to 30 minutes of continuous aerobic without signs/symptoms of physical distress      Intensity   THRR 40-80% of Max Heartrate  67-138    Ratings of Perceived Exertion  11-13    Perceived Dyspnea  0-4      Progression   Progression  Continue progressive overload as per policy without signs/symptoms or physical distress.      Resistance Training   Training Prescription  Yes    Weight  4lbs    Reps  10-15       Perform Capillary Blood Glucose checks as needed.  Exercise Prescription Changes: Exercise Prescription Changes    Row Name 02/14/18 1454 03/07/18 1538 03/21/18 1641 03/30/18 1416 04/16/18 1550     Response to Exercise   Blood Pressure (Admit)  120/84  147/97  118/72  114/76  122/80    Blood Pressure (Exercise)  142/78  128/82  156/80  128/66  122/80   Blood Pressure (Exit)  118/78  122/72  132/70  110/60  104/62   Heart Rate (Admit)  96 bpm  85 bpm  95 bpm  99 bpm  98 bpm   Heart Rate (Exercise)  131 bpm  106 bpm  131 bpm  113 bpm  121 bpm   Heart Rate (Exit)  96 bpm  80 bpm  100 bpm  87 bpm  80 bpm   Rating of Perceived Exertion (Exercise)  _0 Perceived Dyspnea (Exercise)  0  0  0  0  0   Symptoms  None   None   None  None  None   Comments  Pt oriented to exercise equipment   -  None  -  Pt has severe knee pain, Walking Track on hold   Duration  Progress to 30 minutes of  aerobic without signs/symptoms of physical distress  Continue with 30 min of aerobic exercise without signs/symptoms of physical distress.  Continue with 30 min of aerobic exercise without signs/symptoms of physical distress.  Continue with 30 min of aerobic exercise without signs/symptoms of physical distress.  Continue with 30 min of aerobic exercise without signs/symptoms of physical distress.   Intensity  THRR New  THRR unchanged  THRR unchanged  THRR unchanged  THRR unchanged     Progression   Progression  Continue to progress workloads to maintain intensity without signs/symptoms of physical distress.  Continue to progress workloads to maintain intensity without signs/symptoms of physical distress.  Continue to progress workloads to maintain intensity without signs/symptoms of physical distress.  Continue to progress workloads to maintain intensity without signs/symptoms of physical distress.  Continue to progress workloads to maintain intensity without signs/symptoms of physical distress.   Average METs  2  1.9  1.8  1.84  1.84     Resistance Training   Training Prescription  No  Yes  No  Yes  Yes   Weight  -  4lbs  -  4lbs  4lbs   Reps  -  10-15  -  10-15  10-15   Time  -  10 Minutes  -  10 Minutes  10 Minutes     Interval Training   Interval Training  No  No  No  No  No      NuStep   Level  _1 SPM  75  75  85  95  95   Minutes  _2 METs  1.9  1.9  1.8  2  1.9     Arm Ergometer   Level  -  _3 Watts  -  _4 40   Minutes  -  _5 METs  -  1.8  1.8  1.8  2     Track   Laps  _6 -   Minutes  _7 -   METs  0.9  1.84  1.84  1.84  -     Home Exercise Plan   Plans to continue exercise at  -  Home (comment) Stationary Bike @ [place] (comment) Stationary Bike @ Home  Home (comment) Stationary Bike @ [place] (comment) Stationary Bike @ Home    Frequency  -  Add 2 additional days to program exercise sessions.  Add 2 additional days to program exercise sessions.  Add 2 additional days to program exercise sessions.  Add 2 additional days to program exercise sessions.   Initial Home Exercises Provided  -  03/07/18  03/07/18  03/07/18  03/07/18   Row Name 04/20/18 0809             Response to Exercise   Blood Pressure (Admit)  122/72       Blood Pressure (Exercise)  118/72       Blood Pressure (Exit)  108/70       Heart Rate (Admit)  101 bpm       Heart Rate (Exercise)  107 bpm       Heart Rate (Exit)  86 bpm       Rating of Perceived Exertion (Exercise)  11       Perceived Dyspnea (Exercise)  0       Symptoms  None       Comments  Pt has severe knee pain, Walking Track on hold       Duration  Continue with 30 min of aerobic exercise without signs/symptoms of physical distress.       Intensity  THRR unchanged         Progression   Progression  Continue to progress workloads to maintain intensity without signs/symptoms of physical distress.       Average METs  1.88         Resistance Training   Training Prescription  Yes       Weight  4lbs       Reps  10-15       Time  10 Minutes         Interval Training   Interval Training  No         NuStep   Level  3       SPM  105       Minutes  20       METs  1.9         Arm Ergometer   Level  3       Watts  40        Minutes  10       METs  2         Home Exercise Plan   Plans to continue exercise at  Home (comment) Stationary Bike @ Home        Frequency  Add 2 additional days to program exercise sessions.       Initial Home Exercises Provided  03/07/18          Exercise Comments: Exercise Comments    Row Name 02/14/18 1455 03/08/18 1537 03/30/18 1407 05/03/18 0802     Exercise Comments  Pt's first day of exercise. Pt responded well to exercise prescription. Will continue to work with pt to increase workloads as tolerated.   Reviewed HEP with pt. Pt has stationary bike at home. Pt will gradually build her stamina to work up to 30 minutes. Will continue to monitor pt and follow up regarding HEP.   Reviewed METs and Goals with pt. Encouarged pt to continue to attend rehab regularly. Will continue to work with pt and progress as tolerated.   Pt has been struggling with attendance in Cardiac Rehab. Pt is also  dealing with orthopedic issues with her knee. Pt has started physical therapy. Encouraged pt to do the exercises at home as directed to help with her recovery. Pt graduates from Cardiac Rehab tmw. Will follow up with pt regarding plans for exercise post Physical Therapy.        Exercise Goals and Review: Exercise Goals    Row Name 02/08/18 0934             Exercise Goals   Increase Physical Activity  Yes       Intervention  Provide advice, education, support and counseling about physical activity/exercise needs.;Develop an individualized exercise prescription for aerobic and resistive training based on initial evaluation findings, risk stratification, comorbidities and participant's personal goals.       Expected Outcomes  Short Term: Attend rehab on a regular basis to increase amount of physical activity.;Long Term: Add in home exercise to make exercise part of routine and to increase amount of physical activity.;Long Term: Exercising regularly at least 3-5 days a week.       Increase Strength  and Stamina  Yes       Intervention  Provide advice, education, support and counseling about physical activity/exercise needs.;Develop an individualized exercise prescription for aerobic and resistive training based on initial evaluation findings, risk stratification, comorbidities and participant's personal goals.       Expected Outcomes  Short Term: Increase workloads from initial exercise prescription for resistance, speed, and METs.;Short Term: Perform resistance training exercises routinely during rehab and add in resistance training at home;Long Term: Improve cardiorespiratory fitness, muscular endurance and strength as measured by increased METs and functional capacity (6MWT)       Able to understand and use rate of perceived exertion (RPE) scale  Yes       Intervention  Provide education and explanation on how to use RPE scale       Expected Outcomes  Short Term: Able to use RPE daily in rehab to express subjective intensity level;Long Term:  Able to use RPE to guide intensity level when exercising independently       Knowledge and understanding of Target Heart Rate Range (THRR)  Yes       Intervention  Provide education and explanation of THRR including how the numbers were predicted and where they are located for reference       Expected Outcomes  Short Term: Able to state/look up THRR;Long Term: Able to use THRR to govern intensity when exercising independently;Short Term: Able to use daily as guideline for intensity in rehab       Able to check pulse independently  Yes       Intervention  Provide education and demonstration on how to check pulse in carotid and radial arteries.;Review the importance of being able to check your own pulse for safety during independent exercise       Expected Outcomes  Short Term: Able to explain why pulse checking is important during independent exercise;Long Term: Able to check pulse independently and accurately       Understanding of Exercise Prescription  Yes        Intervention  Provide education, explanation, and written materials on patient's individual exercise prescription       Expected Outcomes  Short Term: Able to explain program exercise prescription;Long Term: Able to explain home exercise prescription to exercise independently          Exercise Goals Re-Evaluation : Exercise Goals Re-Evaluation    Row Name 03/08/18 1540 03/30/18 1409  Exercise Goal Re-Evaluation   Exercise Goals Review  Increase Physical Activity;Increase Strength and Stamina;Able to understand and use rate of perceived exertion (RPE) scale;Knowledge and understanding of Target Heart Rate Range (THRR);Able to check pulse independently;Understanding of Exercise Prescription  Increase Physical Activity;Understanding of Exercise Prescription      Comments  Reviewed HEP with pt. Reviewed THRR, RPE Scale, weather conditions, endpoints of exercise, NTG use, warmup and cool down.  Pt puts forth good effort despite orthopedic limitations. Working with pt to increase workloads and MET level as tolerated.       Expected Outcomes  Pt will continue to exercise using stationary bike 2 days a week for 10 minutes. Pt will gradually add a minute, until she gets to 30 minutes. Pt will continue to increase stamina and strength. Will continue to monitor and progress pt.   Pt will continue to increase cardiorespiratory fitness and functinal fitness. Pt will continue to exercise at home, 2 days a week using stationary bike. Will continue to monitor and progress as tolerated.          Discharge Exercise Prescription (Final Exercise Prescription Changes): Exercise Prescription Changes - 04/20/18 0809      Response to Exercise   Blood Pressure (Admit)  122/72    Blood Pressure (Exercise)  118/72    Blood Pressure (Exit)  108/70    Heart Rate (Admit)  101 bpm    Heart Rate (Exercise)  107 bpm    Heart Rate (Exit)  86 bpm    Rating of Perceived Exertion (Exercise)  11    Perceived  Dyspnea (Exercise)  0    Symptoms  None    Comments  Pt has severe knee pain, Walking Track on hold    Duration  Continue with 30 min of aerobic exercise without signs/symptoms of physical distress.    Intensity  THRR unchanged      Progression   Progression  Continue to progress workloads to maintain intensity without signs/symptoms of physical distress.    Average METs  1.88      Resistance Training   Training Prescription  Yes    Weight  4lbs    Reps  10-15    Time  10 Minutes      Interval Training   Interval Training  No      NuStep   Level  3    SPM  105    Minutes  20    METs  1.9      Arm Ergometer   Level  3    Watts  40    Minutes  10    METs  2      Home Exercise Plan   Plans to continue exercise at  Home (comment)   Stationary Bike @ Home    Frequency  Add 2 additional days to program exercise sessions.    Initial Home Exercises Provided  03/07/18       Nutrition:  Target Goals: Understanding of nutrition guidelines, daily intake of sodium <1541m, cholesterol <2057m calories 30% from fat and 7% or less from saturated fats, daily to have 5 or more servings of fruits and vegetables.  Biometrics: Pre Biometrics - 02/08/18 1232      Pre Biometrics   Height  5' 8.75" (1.746 m)    Weight  (!) 179.8 kg    Waist Circumference  62.25 inches    Hip Circumference  64 inches    Waist to Hip Ratio  0.97 %  BMI (Calculated)  58.98    Triceps Skinfold  66 mm    % Body Fat  67.7 %    Grip Strength  34 kg    Flexibility  9 in    Single Leg Stand  4 seconds      Post Biometrics - 05/03/18 0805       Post  Biometrics   Height  5' 8.74" (1.746 m)    Weight  (!) 177.4 kg    Waist Circumference  61 inches    Hip Circumference  64 inches    Waist to Hip Ratio  0.95 %    BMI (Calculated)  58.19    Triceps Skinfold  66 mm    % Body Fat  66.8 %    Grip Strength  39 kg    Flexibility  0 in    Single Leg Stand  0 seconds       Nutrition Therapy Plan and  Nutrition Goals: Nutrition Therapy & Goals - 02/08/18 1351      Nutrition Therapy   Diet  consistent carbohydrate heart healthy      Personal Nutrition Goals   Nutrition Goal  Pt to identify food quantities necessary to achieve weight loss of 6-24 lbs. at graduation from cardiac rehab. Goal wt loss of 20 lb desired.     Personal Goal #2  Pt to identify and limit food sources of saturated fat, trans fat, and sodium      Intervention Plan   Intervention  Prescribe, educate and counsel regarding individualized specific dietary modifications aiming towards targeted core components such as weight, hypertension, lipid management, diabetes, heart failure and other comorbidities.    Expected Outcomes  Short Term Goal: Understand basic principles of dietary content, such as calories, fat, sodium, cholesterol and nutrients.       Nutrition Assessments: Nutrition Assessments - 02/08/18 1352      MEDFICTS Scores   Pre Score  44       Nutrition Goals Re-Evaluation: Nutrition Goals Re-Evaluation    Row Name 02/08/18 1351             Goals   Current Weight  396 lb 6.2 oz (179.8 kg)          Nutrition Goals Re-Evaluation: Nutrition Goals Re-Evaluation    [place]oks Name 02/08/18 1351             Goals   Current Weight  396 lb 6.2 oz (179.8 kg)          Nutrition Goals Discharge (Final Nutrition Goals Re-Evaluation): Nutrition Goals Re-Evaluation - 02/08/18 1351      Goals   Current Weight  396 lb 6.2 oz (179.8 kg)       Psychosocial: Target Goals: Acknowledge presence or absence of significant depression and/or stress, maximize coping skills, provide positive support system. Participant is able to verbalize types and ability to use techniques and skills needed for reducing stress and depression.  Initial Review & Psychosocial Screening: Initial Psych Review & Screening - 02/08/18 0918      Initial Review   Current issues with  Current Depression      Family Dynamics    Good Support System?  No   no self identified support system   Concerns  No support system;Recent loss of significant other   mother and sister    Comments  pt currently working with personal therapist, Helene Kelp at [facility]; pt also socially isolatd due to physical and emotional limitations  Barriers   Psychosocial barriers to participate in program  Psychosocial barriers identified (see note)   self social isolation from depression and physical barriers, pt faith altered during grief process      Screening Interventions   Interventions  Encouraged to exercise;Program counselor consult;To provide support and resources with identified psychosocial needs;Provide feedback about the scores to participant    Expected Outcomes  Short Term goal: Utilizing psychosocial counselor, staff and physician to assist with identification of specific Stressors or current issues interfering with healing process. Setting desired goal for each stressor or current issue identified.;Long Term Goal: Stressors or current issues are controlled or eliminated.;Short Term goal: Identification and review with participant of any Quality of Life or Depression concerns found by scoring the questionnaire.;Long Term goal: The participant improves quality of Life and PHQ9 Scores as seen by post scores and/or verbalization of changes       Quality of Life Scores: Quality of Life - 02/08/18 0919      Quality of Life   Select  Quality of Life      Quality of Life Scores   Health/Function Pre  14.63 %    Socioeconomic Pre  16.06 %    Psych/Spiritual Pre  2.57 %    Family Pre  20.4 %    GLOBAL Pre  13.29 %      Scores of 19 and below usually indicate a poorer quality of life in these areas.  A difference of  2-3 points is a clinically meaningful difference.  A difference of 2-3 points in the total score of the Quality of Life Index has been associated with significant improvement in overall quality of life,  self-image, physical symptoms, and general health in studies assessing change in quality of life.  PHQ-9: Recent Review Flowsheet Data    Depression screen [facility] 2/9 02/14/2018   Decreased Interest 1   Down, Depressed, Hopeless 1   PHQ - 2 Score 2     Interpretation of Total Score  Total Score Depression Severity:  1-4 = Minimal depression, 5-9 = Mild depression, 10-14 = Moderate depression, 15-19 = Moderately severe depression, 20-27 = Severe depression   Psychosocial Evaluation and Intervention: Psychosocial Evaluation - 02/14/18 1422      Psychosocial Evaluation & Interventions   Interventions  Therapist referral;Encouraged to exercise with the program and follow exercise prescription;Stress management education;Relaxation education   Pt currently meets with therapist, Nanette Thunderburke.   Comments  Acasia has current depression. She is not happy with her health and general appearance.  She has also suffered loss of her support system. She meets with her therapists and feels that this is helpful. She has been socially isolated due to depression and physical barriers but is starting to do things with her neighbor. Previously Shaneka enjoyed traveling and going to museums.     Expected Outcomes  Sarahbeth will become less isolated and see herself in a more positive manner.    Continue Psychosocial Services   Follow up required by staff       Psychosocial Re-Evaluation: Psychosocial Re-Evaluation    [place] Name 03/08/18 1147 04/04/18 1708 05/03/18 1550         Psychosocial Re-Evaluation   Current issues with  Current Depression  Current Depression  Current Depression     Comments  Vilda enjoys coming to CR.  She continues to meet with her therapist.  No interventions necessary at this time.  Jolaine enjoys coming to CR.  She continues to meet with  her therapist.  No interventions necessary at this time.  Antanasia enjoys coming to CR.  She continues to meet with her therapist.  No interventions  necessary at this time.     Expected Outcomes  Daysia will exhibit a positive outlook with good coping skills.  Treena will continue to meet with her therapist as Natasha Mead and develop coping skills for when she feels depressed.   Tiyanna will continue to meet with her therapist as Natasha Mead and develop coping skills for when she feels depressed.      Interventions  Encouraged to attend Cardiac Rehabilitation for the exercise;Stress management education;Relaxation education  Encouraged to attend Cardiac Rehabilitation for the exercise;Stress management education;Relaxation education  Encouraged to attend Cardiac Rehabilitation for the exercise;Stress management education;Relaxation education     Continue Psychosocial Services   Follow up required by staff  Follow up required by staff  Follow up required by staff        Psychosocial Discharge (Final Psychosocial Re-Evaluation): Psychosocial Re-Evaluation - 05/03/18 1550      Psychosocial Re-Evaluation   Current issues with  Current Depression    Comments  Doneta enjoys coming to CR.  She continues to meet with her therapist.  No interventions necessary at this time.    Expected Outcomes  Halsey will continue to meet with her therapist as Natasha Mead and develop coping skills for when she feels depressed.     Interventions  Encouraged to attend Cardiac Rehabilitation for the exercise;Stress management education;Relaxation education    Continue Psychosocial Services   Follow up required by staff       Vocational Rehabilitation: Provide vocational rehab assistance to qualifying candidates.   Vocational Rehab Evaluation & Intervention: Vocational Rehab - 02/08/18 0918      Initial Vocational Rehab Evaluation & Intervention   Assessment shows need for Vocational Rehabilitation  No   medically disabled       Education: Education Goals: Education classes will be provided on a weekly basis, covering required topics. Participant will state  understanding/return demonstration of topics presented.  Learning Barriers/Preferences: Learning Barriers/Preferences - 02/08/18 0930      Learning Barriers/Preferences   Learning Barriers  Sight    Learning Preferences  Video;Pictoral;Written Material       Education Topics: Count Your Pulse:  -Group instruction provided by verbal instruction, demonstration, patient participation and written materials to support subject.  Instructors address importance of being able to find your pulse and how to count your pulse when at home without a heart monitor.  Patients get hands on experience counting their pulse with staff help and individually.   CARDIAC REHAB PHASE II EXERCISE from 03/21/2018 in [place]  Date  03/09/18  Instruction Review Code  2- Demonstrated Understanding      Heart Attack, Angina, and Risk Factor Modification:  -Group instruction provided by verbal instruction, video, and written materials to support subject.  Instructors address signs and symptoms of angina and heart attacks.    Also discuss risk factors for heart disease and how to make changes to improve heart health risk factors.   CARDIAC REHAB PHASE II EXERCISE from 03/21/2018 in [place]  Date  03/07/18  Instruction Review Code  2- Demonstrated Understanding      Functional Fitness:  -Group instruction provided by verbal instruction, demonstration, patient participation, and written materials to support subject.  Instructors address safety measures for doing things around the house.  Discuss how to get up and  down off the floor, how to pick things up properly, how to safely get out of a chair without assistance, and balance training.   Meditation and Mindfulness:  -Group instruction provided by verbal instruction, patient participation, and written materials to support subject.  Instructor addresses importance of mindfulness and meditation  practice to help reduce stress and improve awareness.  Instructor also leads participants through a meditation exercise.    Stretching for Flexibility and Mobility:  -Group instruction provided by verbal instruction, patient participation, and written materials to support subject.  Instructors lead participants through series of stretches that are designed to increase flexibility thus improving mobility.  These stretches are additional exercise for major muscle groups that are typically performed during regular warm up and cool down.   Hands Only CPR:  -Group verbal, video, and participation provides a basic overview of AHA guidelines for community CPR. Role-play of emergencies allow participants the opportunity to practice calling for help and chest compression technique with discussion of AED use.   Hypertension: -Group verbal and written instruction that provides a basic overview of hypertension including the most recent diagnostic guidelines, risk factor reduction with self-care instructions and medication management.    Nutrition I class: Heart Healthy Eating:  -Group instruction provided by PowerPoint slides, verbal discussion, and written materials to support subject matter. The instructor gives an explanation and review of the Therapeutic Lifestyle Changes diet recommendations, which includes a discussion on lipid goals, dietary fat, sodium, fiber, plant stanol/sterol esters, sugar, and the components of a well-balanced, healthy diet.   Nutrition II class: Lifestyle Skills:  -Group instruction provided by PowerPoint slides, verbal discussion, and written materials to support subject matter. The instructor gives an explanation and review of label reading, grocery shopping for heart health, heart healthy recipe modifications, and ways to make healthier choices when eating out.   Diabetes Question & Answer:  -Group instruction provided by PowerPoint slides, verbal discussion, and  written materials to support subject matter. The instructor gives an explanation and review of diabetes co-morbidities, pre- and post-prandial blood glucose goals, pre-exercise blood glucose goals, signs, symptoms, and treatment of hypoglycemia and hyperglycemia, and foot care basics.   Diabetes Blitz:  -Group instruction provided by PowerPoint slides, verbal discussion, and written materials to support subject matter. The instructor gives an explanation and review of the physiology behind type 1 and type 2 diabetes, diabetes medications and rational behind using different medications, pre- and post-prandial blood glucose recommendations and Hemoglobin A1c goals, diabetes diet, and exercise including blood glucose guidelines for exercising safely.    Portion Distortion:  -Group instruction provided by PowerPoint slides, verbal discussion, written materials, and food models to support subject matter. The instructor gives an explanation of serving size versus portion size, changes in portions sizes over the last 20 years, and what consists of a serving from each food group.   Stress Management:  -Group instruction provided by verbal instruction, video, and written materials to support subject matter.  Instructors review role of stress in heart disease and how to cope with stress positively.     Exercising on Your Own:  -Group instruction provided by verbal instruction, power point, and written materials to support subject.  Instructors discuss benefits of exercise, components of exercise, frequency and intensity of exercise, and end points for exercise.  Also discuss use of nitroglycerin and activating EMS.  Review options of places to exercise outside of rehab.  Review guidelines for sex with heart disease.   CARDIAC REHAB PHASE II EXERCISE  from 03/21/2018 in [place]  Date  03/21/18  Educator  EP  Instruction Review Code  2- Demonstrated Understanding       Cardiac Drugs I:  -Group instruction provided by verbal instruction and written materials to support subject.  Instructor reviews cardiac drug classes: antiplatelets, anticoagulants, beta blockers, and statins.  Instructor discusses reasons, side effects, and lifestyle considerations for each drug class.   Cardiac Drugs II:  -Group instruction provided by verbal instruction and written materials to support subject.  Instructor reviews cardiac drug classes: angiotensin converting enzyme inhibitors (ACE-I), angiotensin II receptor blockers (ARBs), nitrates, and calcium channel blockers.  Instructor discusses reasons, side effects, and lifestyle considerations for each drug class.   CARDIAC REHAB PHASE II EXERCISE from 03/21/2018 in [place]  Date  03/14/18  Instruction Review Code  2- Demonstrated Understanding      Anatomy and Physiology of the Circulatory System:  Group verbal and written instruction and models provide basic cardiac anatomy and physiology, with the coronary electrical and arterial systems. Review of: AMI, Angina, Valve disease, Heart Failure, Peripheral Artery Disease, Cardiac Arrhythmia, Pacemakers, and the ICD.   Other Education:  -Group or individual verbal, written, or video instructions that support the educational goals of the cardiac rehab program.   Holiday Eating Survival Tips:  -Group instruction provided by PowerPoint slides, verbal discussion, and written materials to support subject matter. The instructor gives patients tips, tricks, and techniques to help them not only survive but enjoy the holidays despite the onslaught of food that accompanies the holidays.   Knowledge Questionnaire Score: Knowledge Questionnaire Score - 02/08/18 0918      Knowledge Questionnaire Score   Pre Score  14/24       Core Components/Risk Factors/Patient Goals at Admission: Personal Goals and Risk Factors at Admission - 02/08/18 1227       Core Components/Risk Factors/Patient Goals on Admission    Weight Management  Yes;Obesity;Weight Loss    Intervention  Weight Management: Develop a combined nutrition and exercise program designed to reach desired caloric intake, while maintaining appropriate intake of nutrient and fiber, sodium and fats, and appropriate energy expenditure required for the weight goal.;Weight Management: Provide education and appropriate resources to help participant work on and attain dietary goals.;Weight Management/Obesity: Establish reasonable short term and long term weight goals.;Obesity: Provide education and appropriate resources to help participant work on and attain dietary goals.    Admit Weight  396 lb 6.2 oz (179.8 kg)    Goal Weight: Short Term  385 lb (174.6 kg)    Goal Weight: Long Term  376 lb (170.6 kg)    Expected Outcomes  Short Term: Continue to assess and modify interventions until short term weight is achieved;Long Term: Adherence to nutrition and physical activity/exercise program aimed toward attainment of established weight goal;Weight Loss: Understanding of general recommendations for a balanced deficit meal plan, which promotes 1-2 lb weight loss per week and includes a negative energy balance of [id number] kcal/d;Understanding recommendations for meals to include 15-35% energy as protein, 25-35% energy from fat, 35-60% energy from carbohydrates, less than 281m of dietary cholesterol, 20-35 gm of total fiber daily;Understanding of distribution of calorie intake throughout the day with the consumption of 4-5 meals/snacks    Diabetes  Yes    Intervention  Provide education about signs/symptoms and action to take for hypo/hyperglycemia.;Provide education about proper nutrition, including hydration, and aerobic/resistive exercise prescription along with prescribed medications to achieve blood  glucose in normal ranges: Fasting glucose 65-99 mg/dL    Expected Outcomes  Short Term: Participant  verbalizes understanding of the signs/symptoms and immediate care of hyper/hypoglycemia, proper foot care and importance of medication, aerobic/resistive exercise and nutrition plan for blood glucose control.;Long Term: Attainment of HbA1C < 7%.    Heart Failure  Yes    Intervention  Provide a combined exercise and nutrition program that is supplemented with education, support and counseling about heart failure. Directed toward relieving symptoms such as shortness of breath, decreased exercise tolerance, and extremity edema.    Expected Outcomes  Improve functional capacity of life;Short term: Attendance in program 2-3 days a week with increased exercise capacity. Reported lower sodium intake. Reported increased fruit and vegetable intake. Reports medication compliance.;Short term: Daily weights obtained and reported for increase. Utilizing diuretic protocols set by physician.;Long term: Adoption of self-care skills and reduction of barriers for early signs and symptoms recognition and intervention leading to self-care maintenance.    Hypertension  Yes    Intervention  Monitor prescription use compliance.;Provide education on lifestyle modifcations including regular physical activity/exercise, weight management, moderate sodium restriction and increased consumption of fresh fruit, vegetables, and low fat dairy, alcohol moderation, and smoking cessation.    Expected Outcomes  Short Term: Continued assessment and intervention until BP is < 140/100m HG in hypertensive participants. < 130/873mHG in hypertensive participants with diabetes, heart failure or chronic kidney disease.;Long Term: Maintenance of blood pressure at goal levels.    Lipids  Yes    Intervention  Provide education and support for participant on nutrition & aerobic/resistive exercise along with prescribed medications to achieve LDL <701mHDL >80m53m  Expected Outcomes  Short Term: Participant states understanding of desired cholesterol  values and is compliant with medications prescribed. Participant is following exercise prescription and nutrition guidelines.;Long Term: Cholesterol controlled with medications as prescribed, with individualized exercise RX and with personalized nutrition plan. Value goals: LDL < 70mg77mL > 40 mg.       Core Components/Risk Factors/Patient Goals Review:  Goals and Risk Factor Review    Row Name 02/14/18 1418 03/08/18 1148 04/04/18 1709 05/03/18 1550       Core Components/Risk Factors/Patient Goals Review   Personal Goals Review  Weight Management/Obesity;Lipids;Hypertension;Diabetes;Heart Failure  Weight Management/Obesity;Lipids;Hypertension;Diabetes;Heart Failure  Weight Management/Obesity;Lipids;Hypertension;Diabetes;Heart Failure  Weight Management/Obesity;Lipids;Hypertension;Diabetes;Heart Failure    Review  Pt with multiple CAD RFs willing to participate in CR exercise.  WandaTamannad like to build her heart strength and increase her energy/stamina.  Pt with multiple CAD RFs willing to participate in CR exercise.  WandaSimranorking towards increasing her strength.  She was having issues with maintaining a CBG acceptable for exercise.  Medication adjustments were made and her CBG has been adequate thus far.  Luiza's weight has been fluctuating as well.  She uses a monitored scale at home that informs her MDs of this for appropriate management.  Pt with multiple CAD RFs willing to participate in CR exercise.  WandaMalayahorking towards increasing her strength.  WandaMelenahad intermittent absences for various reasons.  Gael's weight has been fluctuating.  She continues to weight herself at home and is called by MD office with any weight gain.  Harris's CBG level is remaining an adequate level for exercise.  Pt with multiple CAD RFs willing to participate in CR exercise.  WandaZahrahorking towards increasing her strength.  WandaCosimahad intermittent absences for various reasons.  Teya's CBG level has been  elevated with steriod use. She graduates 05/04/18.    Expected Outcomes  Varvara will participate in CR exericise, nutrition, and lifestlye modification opportunities.   Nakea will participate in CR exericise, nutrition, and lifestlye modification opportunities. CBG will remain adequate for exercise. Stamatia will continue to weigh herself everyday.   Bernadett will participate in CR exericise, nutrition, and lifestlye modification opportunities. CBG will remain adequate for exercise. Trish will continue to weigh herself everyday.   Wynelle will participate in CR exericise, nutrition, and lifestlye modification opportunities. CBG will remain adequate for exercise. Cinthia will continue to weigh herself everyday.        Core Components/Risk Factors/Patient Goals at Discharge (Final Review):  Goals and Risk Factor Review - 05/03/18 1550      Core Components/Risk Factors/Patient Goals Review   Personal Goals Review  Weight Management/Obesity;Lipids;Hypertension;Diabetes;Heart Failure    Review  Pt with multiple CAD RFs willing to participate in CR exercise.  Keona is working towards increasing her strength.  Janei has had intermittent absences for various reasons.  Nechelle's CBG level has been elevated with steriod use. She graduates 05/04/18.    Expected Outcomes  Shammara will participate in CR exericise, nutrition, and lifestlye modification opportunities. CBG will remain adequate for exercise. Koriana will continue to weigh herself everyday.        ITP Comments: ITP Comments    Row Name 02/07/18 1536 02/14/18 1417 03/08/18 1145 04/04/18 1707 05/03/18 1549   ITP Comments  Dr. Fransico Him, Medical Director  Mariann Laster started exercise today and tolerated it well.  30 Day ITP Review. Denyce has been doing well with exercise.  She has missed some sessions due to sickness and low CBGs.   30 Day ITP Review.  Zeah has continued to have intermittent absences for various reasons.  Her CBG has normalized for exercise but her weight  is fluctuating.   30 Day ITP Review.  Merikay has continued to have intermittent absences for various reasons.  She has been having issues with elevated CBGs d/t steroid use for her back and knee pain. She graduates 05/04/18.      Comments: See ITP Comments.

## 2018-05-04 ENCOUNTER — Encounter (HOSPITAL_COMMUNITY)
Admission: RE | Admit: 2018-05-04 | Discharge: 2018-05-04 | Disposition: A | Discharge status: Home / Self Care | Payer: Medicaid Other | Source: Ambulatory Visit | Attending: Cardiology | Admitting: Cardiology

## 2018-05-04 ENCOUNTER — Encounter (HOSPITAL_COMMUNITY): Payer: Medicaid Other

## 2018-05-04 DIAGNOSIS — I5022 Chronic systolic (congestive) heart failure: Secondary | ICD-10-CM

## 2018-05-04 DIAGNOSIS — I110 Hypertensive heart disease with heart failure: Secondary | ICD-10-CM | POA: Diagnosis not present

## 2018-05-07 ENCOUNTER — Encounter (HOSPITAL_COMMUNITY): Payer: Medicaid Other

## 2018-05-08 ENCOUNTER — Encounter: Payer: Self-pay | Admitting: Physical Therapy

## 2018-05-08 ENCOUNTER — Ambulatory Visit: Payer: Medicaid Other | Admitting: Physical Therapy

## 2018-05-08 DIAGNOSIS — M6281 Muscle weakness (generalized): Secondary | ICD-10-CM

## 2018-05-08 DIAGNOSIS — M5442 Lumbago with sciatica, left side: Secondary | ICD-10-CM | POA: Diagnosis not present

## 2018-05-08 DIAGNOSIS — M62838 Other muscle spasm: Secondary | ICD-10-CM

## 2018-05-08 DIAGNOSIS — G8929 Other chronic pain: Secondary | ICD-10-CM

## 2018-05-08 NOTE — Patient Instructions (Addendum)
Pelvic Tilt    Flatten back by tightening stomach muscles and buttocks. Repeat __10-20__ times per set. Do __2__ sets per session. Do _2___ sessions per day.  Lower Trunk Rotation Stretch    Keeping back flat and feet together, rotate knees to left side. Hold __10__ seconds. Repeat __5__ times each side. Do __1__ sets per session. Do __2__ sessions per day.    External Rotation: Hip - Knees Apart (Hook-Lying)    Lie with hips and knees bent, band tied just above knees. Pull knees apart. Hold for _5__ seconds.  Repeat _20__ times. Do _2__ times a day.  Hamstring Step 1    Straighten left knee and pull toward you, trying to keep it straight.  Hold _30__ seconds. Relax knee by returning foot to start. Repeat _3__ times.  Copyright  VHI. All rights reserved.

## 2018-05-08 NOTE — Therapy (Addendum)
[facility] [place], [place], [address] Phone: [phone]   Fax:  [phone]  Physical Therapy Treatment  Patient Details  Name: KAYLENN CIVIL MRN: [id number] Date of Birth: Oct 05, 1970 Referring Provider (PT): Dr Zollie Beckers   Encounter Date: 05/08/2018  PT End of Session - 05/08/18 0853    Visit Number  2    Number of Visits  4    Date for PT Re-Evaluation  05/22/18    PT Start Time  0845    PT Stop Time  0930    PT Time Calculation (min)  45 min       Past Medical History:  Diagnosis Date  . Acute renal failure (HCC)     Intractable nausea vomiting secondary to diabetic gastroparesis causing dehydration and acute renal failure /notes 04/01/2013  . Anginal pain ([place])   . Anxiety   . Bell's palsy 08/09/2010  . CAP (community acquired pneumonia) 10/2011   Archie Endo 11/08/2011  . Chest pain 01/13/2014  . CHF (congestive heart failure) ([place])   . Diabetic gastroparesis associated with type 2 diabetes mellitus ([place])    this is presumed diagnoses, not confirmed by any studies.   . Eczema   . Family history of malignant neoplasm of breast   . Family history of malignant neoplasm of ovary   . GERD (gastroesophageal reflux disease)   . High cholesterol   . Hypertension   . Liver mass 2014   biopsied 03/2013 at [facility], not malignant.  is to undergo radiologic ablation of the mass in sept/October 2014.   . Lupus ([place])   . Migraines    "maybe a couple times/yr" (04/01/2013)  . Obesity   . Obstructive sleep apnea on CPAP 2011  . On home oxygen therapy    "2L 24/7" (02/28/2015)  . SJOGREN'S SYNDROME 08/09/2010  . SLE (systemic lupus erythematosus) ([place])    Archie Endo 12/01/2010  . Stroke [facility]) 2010; 10/2012   "left side is still weak from it, never fully regained full strength; no additions from stroke 10/2012"    Past Surgical History:  Procedure Laterality Date  . BREAST CYST EXCISION Left 08/2005   epidermoid  .  CARDIAC CATHETERIZATION  02/07/12  . ECTOPIC PREGNANCY SURGERY  1999  . ESOPHAGOGASTRODUODENOSCOPY N/A 02/26/2013   Procedure: ESOPHAGOGASTRODUODENOSCOPY (EGD);  Surgeon: Irene Shipper, MD;  [facility] ENDOSCOPY;  Service: Endoscopy;  Laterality: N/A;  . ESOPHAGOGASTRODUODENOSCOPY N/A 03/02/2015   Procedure: ESOPHAGOGASTRODUODENOSCOPY (EGD);  Surgeon: Milus Banister, MD;  Location: [place];  Service: Endoscopy;  Laterality: N/A;  . HERNIA REPAIR    . LEFT AND RIGHT HEART CATHETERIZATION WITH CORONARY ANGIOGRAM N/A 02/07/2012   Procedure: LEFT AND RIGHT HEART CATHETERIZATION WITH CORONARY ANGIOGRAM;  Surgeon: Laverda Page, MD;  Location: [facility] CATH LAB;  Service: Cardiovascular;  Laterality: N/A;  . LIVER BIOPSY  03/2013   liver mass/medical hx noted above  . MUSCLE BIOPSY     for lupus/notes 12/01/2010  . UMBILICAL HERNIA REPAIR  1980's    There were no vitals filed for this visit.  Subjective Assessment - 05/08/18 0851    Subjective  Not as bad as before. 8/10 today. Not as much tightness in leg with walking.     Pain Score  8     Pain Location  Back    Pain Orientation  Right;Lower;Mid    Pain Descriptors / Indicators  Aching;Sharp    Pain Type  Acute pain    Pain  Radiating Towards  from rt buttock to right ankle , soreness in right knee     Aggravating Factors   walking and all weightbearing     Pain Relieving Factors  ice, biofreeze, heat, ace wrap.                        [facility] Adult PT Treatment/Exercise - 05/08/18 0001      Exercises   Exercises  Lumbar      Lumbar Exercises: Stretches   Active Hamstring Stretch  2 reps;30 seconds    Single Knee to Chest Stretch  Right;2 reps;30 seconds   with sheet under knee   Lower Trunk Rotation  10 seconds;5 reps    Piriformis Stretch  Right;2 reps;30 seconds   seated      Lumbar Exercises: Supine   Pelvic Tilt  10 reps;5 seconds    Clam  20 reps    Clam Limitations  red band       Manual Therapy   Manual  therapy comments  Trigger point release to Right glute medius and right piriformis with tennis ball x 5               PT Short Term Goals - 05/01/18 1100      PT SHORT TERM GOAL #1   Title  I with inital HEP and self trigger point release to Rt buttocks    Baseline  not performing any exericse    Time  3    Period  Weeks    Status  New    Target Date  05/22/18      PT SHORT TERM GOAL #2   Title  report =/> 50% reduction in pain to allow for equal wbing on LE's    Baseline  shifts wt to the Lt and has up to 9/10 pain    Time  3    Period  Weeks    Status  New    Target Date  05/22/18      PT SHORT TERM GOAL #3   Title  demo safe technique to engage deep core muscles for exercise    Baseline  unable to palpate TA or multifidi activation    Time  3    Period  Weeks    Status  New    Target Date  05/22/18        PT Long Term Goals - 05/01/18 1245      PT LONG TERM GOAL #1   Title  to be determined at reassessment    Time  3    Period  Weeks    Status  New    Target Date  05/22/18            Plan - 05/08/18 0905    Clinical Impression Statement  Pt reports decreased pain since last visit, not as much tightness in RLE with ambulation. Reviewed HEP and progressed Lumbar/hip mobility. Updated HEP. Performed trigger point release to right glute medius and piriformis. She felt better after session.     PT Next Visit Plan  manual work Rt buttocks/low back PRN, modalities PRN, core strength and hip stretching    PT Home Exercise Plan  knee to chest, knee to opposite shoulder seated, clam with red band, posterior pelvic tilt.     Consulted and Agree with Plan of Care  Patient       Patient will benefit from skilled therapeutic intervention in order to improve  the following deficits and impairments:  Pain, Increased muscle spasms, Impaired tone, Decreased strength, Obesity  Visit Diagnosis: Chronic left-sided low back pain with left-sided sciatica  Other muscle  spasm  Muscle weakness (generalized)     Problem List Patient Active Problem List   Diagnosis Date Noted  . Multifocal pneumonia 09/23/2017  . Acute respiratory failure ([place]) 09/21/2017  . CHF exacerbation ([place]) 09/20/2017  . Costochondritis 06/06/2017  . Chest pain 06/04/2017  . Diabetes ([place]) 09/19/2015  . Radiculopathy of cervical spine   . Severe headache 03/05/2014  . Family history of malignant neoplasm of breast   . Family history of malignant neoplasm of ovary   . Intractable nausea and vomiting 11/11/2013  . Septate uterus 09/08/2013  . Diabetic gastroparesis ([place]) 04/28/2013  . Constipation due to pain medication 04/21/2013  . Odynophagia 04/04/2013  . Liver mass 02/28/2013  . Reflux esophagitis 02/26/2013  . Morbid obesity ([place]) 10/10/2012  . Chronic diastolic CHF (congestive heart failure) ([place]) 10/09/2012  . Chronic respiratory failure with hypoxia ([place]) 10/09/2012  . Clinical polymyositis syndrome ([place]) 09/06/2012  . Mononeuritis of lower limb 01/12/2012  . HTN (hypertension) 11/08/2011  . Systolic CHF, acute on chronic ([place]) 11/08/2011  . HLD (hyperlipidemia) 07/19/2011  . Bell's palsy 08/09/2010  . SLE 08/09/2010  . [facility] SYNDROME 08/09/2010    Dorene Ar, PTA 05/08/2018, 1:19 PM  [facility] [address] [place], [place], [address] Phone: [phone]   Fax:  [phone]  Name: AHSHA HINSLEY MRN: [id number] Date of Birth: [birth year]   PHYSICAL THERAPY DISCHARGE SUMMARY  Visits from Start of Care: 2  Current functional level related to goals / functional outcomes: Unknown, patient has not returned for therapy   Remaining deficits: unknown   Education / Equipment: Initial HEP Plan:                                                    Patient goals were not met. Patient is being discharged due to not returning since the last visit.  ?????     Jeral Pinch, PT 06/25/18 8:04  AM

## 2018-05-09 ENCOUNTER — Encounter (HOSPITAL_COMMUNITY): Payer: Medicaid Other

## 2018-05-11 ENCOUNTER — Encounter (HOSPITAL_COMMUNITY): Payer: Medicaid Other

## 2018-05-11 ENCOUNTER — Ambulatory Visit (HOSPITAL_COMMUNITY)
Admission: RE | Admit: 2018-05-11 | Discharge: 2018-05-11 | Disposition: A | Discharge status: Home / Self Care | Payer: Medicaid Other | Source: Ambulatory Visit | Attending: Cardiology | Admitting: Cardiology

## 2018-05-11 DIAGNOSIS — I509 Heart failure, unspecified: Secondary | ICD-10-CM | POA: Insufficient documentation

## 2018-05-11 NOTE — Progress Notes (Signed)
  Echocardiogram 2D Echocardiogram has been performed.  [name] 05/11/2018, 9:56 AM

## 2018-05-14 ENCOUNTER — Encounter (HOSPITAL_COMMUNITY): Payer: Medicaid Other

## 2018-05-16 ENCOUNTER — Encounter (HOSPITAL_COMMUNITY): Payer: Medicaid Other

## 2018-05-16 ENCOUNTER — Ambulatory Visit (INDEPENDENT_AMBULATORY_CARE_PROVIDER_SITE_OTHER): Payer: Medicaid Other | Admitting: Physician Assistant

## 2018-05-17 ENCOUNTER — Ambulatory Visit: Payer: Medicaid Other | Admitting: Physical Therapy

## 2018-05-18 ENCOUNTER — Encounter (HOSPITAL_COMMUNITY): Payer: Medicaid Other

## 2018-05-21 ENCOUNTER — Ambulatory Visit: Payer: Medicaid Other | Admitting: Physical Therapy

## 2018-05-23 ENCOUNTER — Ambulatory Visit: Payer: Medicaid Other | Admitting: Physical Therapy

## 2018-05-23 ENCOUNTER — Other Ambulatory Visit: Payer: Self-pay | Admitting: Endocrinology

## 2018-05-24 NOTE — Addendum Note (Signed)
Encounter addended by: Noel Christmas, RN on: 05/24/2018 11:31 AM  Actions taken: Order Reconciliation Section accessed, Flowsheet data copied forward, Visit Navigator Flowsheet section accepted, Sign clinical note, Episode resolved

## 2018-05-24 NOTE — Progress Notes (Signed)
Discharge Progress Report  Patient Details  Name: [name] MRN: [id number] Date of Birth: [birth year] Referring Provider:     CARDIAC REHAB PHASE II ORIENTATION from 02/08/2018 in [place]  Referring Provider  Adrian Prows MD        Number of Visits: 19  Reason for Discharge:  Patient reached a stable level of exercise.  Smoking History:  Social History   Tobacco Use  Smoking Status Former Smoker  . Years: 3.00  . Types: Cigarettes  . Last attempt to quit: 06/01/1990  . Years since quitting: 27.9  Smokeless Tobacco Never Used    Diagnosis:  Chronic systolic congestive heart failure ([place])  ADL [facility]:   Initial Exercise Prescription: Initial Exercise Prescription - 02/08/18 1200      Date of Initial Exercise RX and Referring Provider   Date  02/08/18    Referring Provider  Adrian Prows MD     Expected Discharge Date  05/13/18      NuStep   Level  2    SPM  75    Minutes  10    METs  1.2      Arm Ergometer   Level  2    Watts  25    Minutes  10    METs  2      Track   Laps  4    Minutes  10    METs  0.9      Prescription Details   Frequency (times per week)  3x    Duration  Progress to 30 minutes of continuous aerobic without signs/symptoms of physical distress      Intensity   THRR 40-80% of Max Heartrate  67-138    Ratings of Perceived Exertion  11-13    Perceived Dyspnea  0-4      Progression   Progression  Continue progressive overload as per policy without signs/symptoms or physical distress.      Resistance Training   Training Prescription  Yes    Weight  4lbs    Reps  10-15       Discharge Exercise Prescription (Final Exercise Prescription Changes): Exercise Prescription Changes - 05/04/18 1415      Response to Exercise   Blood Pressure (Admit)  120/60    Blood Pressure (Exercise)  124/60    Blood Pressure (Exit)  128/80    Heart Rate (Admit)  100 bpm    Heart Rate (Exercise)  105 bpm    Heart  Rate (Exit)  101 bpm    Rating of Perceived Exertion (Exercise)  11    Perceived Dyspnea (Exercise)  0    Symptoms  None    Comments  Pt graduated from Cardiac Rehab     Duration  Continue with 30 min of aerobic exercise without signs/symptoms of physical distress.    Intensity  THRR unchanged      Progression   Progression  Continue to progress workloads to maintain intensity without signs/symptoms of physical distress.    Average METs  1.88      Resistance Training   Training Prescription  Yes    Weight  4lbs    Reps  10-15    Time  10 Minutes      Interval Training   Interval Training  No      NuStep   Level  3    SPM  105    Minutes  20    METs  1.9  Arm Ergometer   Level  3    Watts  43    Minutes  10    METs  2      Home Exercise Plan   Plans to continue exercise at  Home (comment)   Stationary Bike @ Home , [employer], Water Aerobics    Frequency  Add 3 additional days to program exercise sessions.    Initial Home Exercises Provided  03/07/18       Functional Capacity: 6 Minute Walk    Row Name 02/08/18 1228 05/03/18 0807       6 Minute Walk   Phase  Initial  Discharge Post Walk Test not complete due to Knee Injury    Distance  498 feet  -    Walk Time  6 minutes  -    # of Rest Breaks  0  -    MPH  0.94  -    METS  1.14  -    RPE  14  -    Perceived Dyspnea   2  -    VO2 Peak  4.01  -    Symptoms  Yes (comment)  -    Comments  Moderate SOB +2, Took break for 1 min and 29 secs due to SOB   -    Resting HR  85 bpm  -    Resting BP  120/80  -    Resting Oxygen Saturation   100 %  -    Exercise Oxygen Saturation  during 6 min walk  100 %  -    Max Ex. HR  120 bpm  -    Max Ex. BP  140/80  -    2 Minute Post BP  110/70  -       Psychological, QOL, Others - Outcomes: PHQ 2/9: Depression screen [facility] 2/9 05/04/2018 02/14/2018  Decreased Interest 0 1  Down, Depressed, Hopeless 0 1  PHQ - 2 Score 0 2  Some recent data might be hidden    Quality of  Life: Quality of Life - 05/04/18 1633      Quality of Life   Select  Quality of Life      Quality of Life Scores   Health/Function Pre  14.63 %    Health/Function Post  25.5 %    Health/Function % Change  74.3 %    Socioeconomic Pre  16.06 %    Socioeconomic Post  26.58 %    Socioeconomic % Change   65.5 %    Psych/Spiritual Pre  2.57 %    Psych/Spiritual Post  25.71 %    Psych/Spiritual % Change  900.39 %    Family Pre  20.4 %    Family Post  28.5 %    Family % Change  39.71 %    GLOBAL Pre  13.29 %    GLOBAL Post  26.6 %    GLOBAL % Change  100.15 %       Personal Goals: Goals established at orientation with interventions provided to work toward goal. Personal Goals and Risk Factors at Admission - 02/08/18 1227      Core Components/Risk Factors/Patient Goals on Admission    Weight Management  Yes;Obesity;Weight Loss    Intervention  Weight Management: Develop a combined nutrition and exercise program designed to reach desired caloric intake, while maintaining appropriate intake of nutrient and fiber, sodium and fats, and appropriate energy expenditure required for the weight goal.;Weight Management: Provide education  and appropriate resources to help participant work on and attain dietary goals.;Weight Management/Obesity: Establish reasonable short term and long term weight goals.;Obesity: Provide education and appropriate resources to help participant work on and attain dietary goals.    Admit Weight  396 lb 6.2 oz (179.8 kg)    Goal Weight: Short Term  385 lb (174.6 kg)    Goal Weight: Long Term  376 lb (170.6 kg)    Expected Outcomes  Short Term: Continue to assess and modify interventions until short term weight is achieved;Long Term: Adherence to nutrition and physical activity/exercise program aimed toward attainment of established weight goal;Weight Loss: Understanding of general recommendations for a balanced deficit meal plan, which promotes 1-2 lb weight loss per week  and includes a negative energy balance of [phone] kcal/d;Understanding recommendations for meals to include 15-35% energy as protein, 25-35% energy from fat, 35-60% energy from carbohydrates, less than 200mg  of dietary cholesterol, 20-35 gm of total fiber daily;Understanding of distribution of calorie intake throughout the day with the consumption of 4-5 meals/snacks    Diabetes  Yes    Intervention  Provide education about signs/symptoms and action to take for hypo/hyperglycemia.;Provide education about proper nutrition, including hydration, and aerobic/resistive exercise prescription along with prescribed medications to achieve blood glucose in normal ranges: Fasting glucose 65-99 mg/dL    Expected Outcomes  Short Term: Participant verbalizes understanding of the signs/symptoms and immediate care of hyper/hypoglycemia, proper foot care and importance of medication, aerobic/resistive exercise and nutrition plan for blood glucose control.;Long Term: Attainment of HbA1C < 7%.    Heart Failure  Yes    Intervention  Provide a combined exercise and nutrition program that is supplemented with education, support and counseling about heart failure. Directed toward relieving symptoms such as shortness of breath, decreased exercise tolerance, and extremity edema.    Expected Outcomes  Improve functional capacity of life;Short term: Attendance in program 2-3 days a week with increased exercise capacity. Reported lower sodium intake. Reported increased fruit and vegetable intake. Reports medication compliance.;Short term: Daily weights obtained and reported for increase. Utilizing diuretic protocols set by physician.;Long term: Adoption of self-care skills and reduction of barriers for early signs and symptoms recognition and intervention leading to self-care maintenance.    Hypertension  Yes    Intervention  Monitor prescription use compliance.;Provide education on lifestyle modifcations including regular physical  activity/exercise, weight management, moderate sodium restriction and increased consumption of fresh fruit, vegetables, and low fat dairy, alcohol moderation, and smoking cessation.    Expected Outcomes  Short Term: Continued assessment and intervention until BP is < 140/12mm HG in hypertensive participants. < 130/51mm HG in hypertensive participants with diabetes, heart failure or chronic kidney disease.;Long Term: Maintenance of blood pressure at goal levels.    Lipids  Yes    Intervention  Provide education and support for participant on nutrition & aerobic/resistive exercise along with prescribed medications to achieve LDL 70mg , HDL >40mg .    Expected Outcomes  Short Term: Participant states understanding of desired cholesterol values and is compliant with medications prescribed. Participant is following exercise prescription and nutrition guidelines.;Long Term: Cholesterol controlled with medications as prescribed, with individualized exercise RX and with personalized nutrition plan. Value goals: LDL < 70mg , HDL > 40 mg.        Personal Goals Discharge: Goals and Risk Factor Review    Row Name 02/14/18 1418 03/08/18 1148 04/04/18 1709 05/03/18 1550 05/24/18 1123     Core Components/Risk Factors/Patient Goals Review   Personal Goals Review  Weight Management/Obesity;Lipids;Hypertension;Diabetes;Heart Failure  Weight Management/Obesity;Lipids;Hypertension;Diabetes;Heart Failure  Weight Management/Obesity;Lipids;Hypertension;Diabetes;Heart Failure  Weight Management/Obesity;Lipids;Hypertension;Diabetes;Heart Failure  Weight Management/Obesity;Lipids;Hypertension;Diabetes;Heart Failure   Review  Pt with multiple CAD RFs willing to participate in CR exercise.  Aunisty would like to build her heart strength and increase her energy/stamina.  Pt with multiple CAD RFs willing to participate in CR exercise.  Madonna is working towards increasing her strength.  She was having issues with maintaining a CBG  acceptable for exercise.  Medication adjustments were made and her CBG has been adequate thus far.  Rozelia's weight has been fluctuating as well.  She uses a monitored scale at home that informs her MDs of this for appropriate management.  Pt with multiple CAD RFs willing to participate in CR exercise.  Jeylin is working towards increasing her strength.  Nocole has had intermittent absences for various reasons.  Raguel's weight has been fluctuating.  She continues to weight herself at home and is called by MD office with any weight gain.  Tameah's CBG level is remaining an adequate level for exercise.  Pt with multiple CAD RFs willing to participate in CR exercise.  Tajah is working towards increasing her strength.  Vernecia has had intermittent absences for various reasons.  Halcyon's CBG level has been elevated with steriod use. She graduates 05/04/18.  Maeva graduated from the program with 19 completed sessions. She enjoyed coming to CR and felt that she increased her strength.    Expected Outcomes  Sarahjane will participate in CR exericise, nutrition, and lifestlye modification opportunities.   Lulubelle will participate in CR exericise, nutrition, and lifestlye modification opportunities. CBG will remain adequate for exercise. Karmen will continue to weigh herself everyday.   Keyara will participate in CR exericise, nutrition, and lifestlye modification opportunities. CBG will remain adequate for exercise. Candie will continue to weigh herself everyday.   Yuleni will participate in CR exericise, nutrition, and lifestlye modification opportunities. CBG will remain adequate for exercise. Sabrin will continue to weigh herself everyday.   Radiance will continue to exercise, nutrition, and lifestyle modification opportunities.  Lucky is going to work with PT for her orthopedic issues.       Exercise Goals and Review: Exercise Goals    Row Name 02/08/18 0934             Exercise Goals   Increase Physical Activity  Yes        Intervention  Provide advice, education, support and counseling about physical activity/exercise needs.;Develop an individualized exercise prescription for aerobic and resistive training based on initial evaluation findings, risk stratification, comorbidities and participant's personal goals.       Expected Outcomes  Short Term: Attend rehab on a regular basis to increase amount of physical activity.;Long Term: Add in home exercise to make exercise part of routine and to increase amount of physical activity.;Long Term: Exercising regularly at least 3-5 days a week.       Increase Strength and Stamina  Yes       Intervention  Provide advice, education, support and counseling about physical activity/exercise needs.;Develop an individualized exercise prescription for aerobic and resistive training based on initial evaluation findings, risk stratification, comorbidities and participant's personal goals.       Expected Outcomes  Short Term: Increase workloads from initial exercise prescription for resistance, speed, and METs.;Short Term: Perform resistance training exercises routinely during rehab and add in resistance training at home;Long Term: Improve cardiorespiratory fitness, muscular endurance and strength as measured by increased METs and functional capacity (6MWT)  Able to understand and use rate of perceived exertion (RPE) scale  Yes       Intervention  Provide education and explanation on how to use RPE scale       Expected Outcomes  Short Term: Able to use RPE daily in rehab to express subjective intensity level;Long Term:  Able to use RPE to guide intensity level when exercising independently       Knowledge and understanding of Target Heart Rate Range (THRR)  Yes       Intervention  Provide education and explanation of THRR including how the numbers were predicted and where they are located for reference       Expected Outcomes  Short Term: Able to state/look up THRR;Long Term: Able to use  THRR to govern intensity when exercising independently;Short Term: Able to use daily as guideline for intensity in rehab       Able to check pulse independently  Yes       Intervention  Provide education and demonstration on how to check pulse in carotid and radial arteries.;Review the importance of being able to check your own pulse for safety during independent exercise       Expected Outcomes  Short Term: Able to explain why pulse checking is important during independent exercise;Long Term: Able to check pulse independently and accurately       Understanding of Exercise Prescription  Yes       Intervention  Provide education, explanation, and written materials on patient's individual exercise prescription       Expected Outcomes  Short Term: Able to explain program exercise prescription;Long Term: Able to explain home exercise prescription to exercise independently          Nutrition & Weight - Outcomes: Pre Biometrics - 02/08/18 1232      Pre Biometrics   Height  5' 8.75" (1.746 m)    Weight  (!) 179.8 kg    Waist Circumference  62.25 inches    Hip Circumference  64 inches    Waist to Hip Ratio  0.97 %    BMI (Calculated)  58.98    Triceps Skinfold  66 mm    % Body Fat  67.7 %    Grip Strength  34 kg    Flexibility  9 in    Single Leg Stand  4 seconds      Post Biometrics - 05/03/18 0805       Post  Biometrics   Height  5' 8.74" (1.746 m)    Weight  (!) 177.4 kg    Waist Circumference  61 inches    Hip Circumference  64 inches    Waist to Hip Ratio  0.95 %    BMI (Calculated)  58.19    Triceps Skinfold  66 mm    % Body Fat  66.8 %    Grip Strength  39 kg    Flexibility  0 in    Single Leg Stand  0 seconds       Nutrition: Nutrition Therapy & Goals - 02/08/18 1351      Nutrition Therapy   Diet  consistent carbohydrate heart healthy      Personal Nutrition Goals   Nutrition Goal  Pt to identify food quantities necessary to achieve weight loss of 6-24 lbs. at  graduation from cardiac rehab. Goal wt loss of 20 lb desired.     Personal Goal #2  Pt to identify and limit food sources of saturated fat, trans  fat, and sodium      Intervention Plan   Intervention  Prescribe, educate and counsel regarding individualized specific dietary modifications aiming towards targeted core components such as weight, hypertension, lipid management, diabetes, heart failure and other comorbidities.    Expected Outcomes  Short Term Goal: Understand basic principles of dietary content, such as calories, fat, sodium, cholesterol and nutrients.       Nutrition Discharge: Nutrition Assessments - 05/18/18 0954      MEDFICTS Scores   Pre Score  44    Post Score  27    Score Difference  -17       Education Questionnaire Score: Knowledge Questionnaire Score - 05/04/18 1633      Knowledge Questionnaire Score   Pre Score  14/24    Post Score  19/24       Goals reviewed with patient; copy given to patient.

## 2018-05-24 NOTE — Telephone Encounter (Signed)
Please address refill request 

## 2018-05-25 ENCOUNTER — Ambulatory Visit: Payer: Medicaid Other | Admitting: Endocrinology

## 2018-05-28 ENCOUNTER — Ambulatory Visit: Payer: Medicaid Other | Admitting: Physical Therapy

## 2018-05-30 ENCOUNTER — Encounter: Payer: Medicaid Other | Admitting: Physical Therapy

## 2018-08-14 ENCOUNTER — Other Ambulatory Visit: Payer: Self-pay | Admitting: Endocrinology

## 2018-08-20 ENCOUNTER — Ambulatory Visit (INDEPENDENT_AMBULATORY_CARE_PROVIDER_SITE_OTHER): Payer: Medicaid Other | Admitting: Endocrinology

## 2018-08-20 ENCOUNTER — Encounter: Payer: Self-pay | Admitting: Endocrinology

## 2018-08-20 VITALS — BP 130/80 | HR 98 | Ht 68.0 in | Wt >= 6400 oz

## 2018-08-20 DIAGNOSIS — Z794 Long term (current) use of insulin: Secondary | ICD-10-CM

## 2018-08-20 DIAGNOSIS — E1143 Type 2 diabetes mellitus with diabetic autonomic (poly)neuropathy: Secondary | ICD-10-CM

## 2018-08-20 LAB — POCT GLYCOSYLATED HEMOGLOBIN (HGB A1C): Hemoglobin A1C: 8.8 % — AB (ref 4.0–5.6)

## 2018-08-20 MED ORDER — INSULIN NPH (HUMAN) (ISOPHANE) 100 UNIT/ML ~~LOC~~ SUSP: 170.0000 [IU] | SUBCUTANEOUS | 11 refills | Status: DC

## 2018-08-20 MED ORDER — EXENATIDE 10 MCG/0.04ML ~~LOC~~ SOPN: 10.0000 ug | PEN_INJECTOR | Freq: Two times a day (BID) | SUBCUTANEOUS | 11 refills | Status: DC

## 2018-08-20 NOTE — Progress Notes (Signed)
Subjective:    Patient ID: [name], female    DOB: Sep 07, 1970, 48 y.o.   MRN: [id number]  HPI Pt returns for f/u of diabetes mellitus: DM type: Insulin-requiring type 2 Dx'ed: 9150 Complications: polyneuropathy and gastroparesis.  Therapy: insulin (since 2014), Byetta, and metformin.   GDM: never.  DKA: never Severe hypoglycemia: never.  Pancreatitis: never.  Other: she is too ill to undergo weight-loss surgery; she takes qd insulin, after poor results with multiple daily injections.   Interval history: she is again tapering prednisone (it was increased due to sciatica--it is now back down to 3 mg qd).  She says she never misses the insulin.  She takes 160 units qam.  no cbg record, but states cbg's vary from 89-189.   Past Medical History:  Diagnosis Date  . Acute renal failure (HCC)     Intractable nausea vomiting secondary to diabetic gastroparesis causing dehydration and acute renal failure /notes 04/01/2013  . Anginal pain ([place])   . Anxiety   . Bell's palsy 08/09/2010  . CAP (community acquired pneumonia) 10/2011   Archie Endo 11/08/2011  . Chest pain 01/13/2014  . CHF (congestive heart failure) ([place])   . Diabetic gastroparesis associated with type 2 diabetes mellitus ([place])    this is presumed diagnoses, not confirmed by any studies.   . Eczema   . Family history of malignant neoplasm of breast   . Family history of malignant neoplasm of ovary   . GERD (gastroesophageal reflux disease)   . High cholesterol   . Hypertension   . Liver mass 2014   biopsied 03/2013 at [facility], not malignant.  is to undergo radiologic ablation of the mass in sept/October 2014.   . Lupus ([place])   . Migraines    "maybe a couple times/yr" (04/01/2013)  . Obesity   . Obstructive sleep apnea on CPAP 2011  . On home oxygen therapy    "2L 24/7" (02/28/2015)  . SJOGREN'S SYNDROME 08/09/2010  . SLE (systemic lupus erythematosus) ([place])    Archie Endo 12/01/2010  . Stroke [facility]) 2010; 10/2012   "left side is  still weak from it, never fully regained full strength; no additions from stroke 10/2012"    Past Surgical History:  Procedure Laterality Date  . BREAST CYST EXCISION Left 08/2005   epidermoid  . CARDIAC CATHETERIZATION  02/07/12  . ECTOPIC PREGNANCY SURGERY  1999  . ECTOPIC PREGNANCY SURGERY  1999  . ESOPHAGOGASTRODUODENOSCOPY N/A 02/26/2013   Procedure: ESOPHAGOGASTRODUODENOSCOPY (EGD);  Surgeon: Irene Shipper, MD;  Location: [facility] ENDOSCOPY;  Service: Endoscopy;  Laterality: N/A;  . ESOPHAGOGASTRODUODENOSCOPY N/A 03/02/2015   Procedure: ESOPHAGOGASTRODUODENOSCOPY (EGD);  Surgeon: Milus Banister, MD;  Location: [place];  Service: Endoscopy;  Laterality: N/A;  . HERNIA REPAIR    . LEFT AND RIGHT HEART CATHETERIZATION WITH CORONARY ANGIOGRAM N/A 02/07/2012   Procedure: LEFT AND RIGHT HEART CATHETERIZATION WITH CORONARY ANGIOGRAM;  Surgeon: Laverda Page, MD;  Location: [facility] CATH LAB;  Service: Cardiovascular;  Laterality: N/A;  . LIVER BIOPSY  03/2013   liver mass/medical hx noted above  . MUSCLE BIOPSY     for lupus/notes 12/01/2010  . UMBILICAL HERNIA REPAIR  1980's    Social History   Socioeconomic History  . Marital status: Single    Spouse name: Not on file  . Number of children: Not on file  . Years of education: Not on file  . Highest education level: Not on file  Occupational History    Employer: UNEMPLOYED  Comment: student  Social Needs  . Financial resource strain: Not on file  . Food insecurity:    Worry: Not on file    Inability: Not on file  . Transportation needs:    Medical: Not on file    Non-medical: Not on file  Tobacco Use  . Smoking status: Former Smoker    Years: 3.00    Types: Cigarettes    Last attempt to quit: 06/02/1991    Years since quitting: 27.2  . Smokeless tobacco: Never Used  Substance and Sexual Activity  . Alcohol use: No    Alcohol/week: 0.0 standard drinks  . Drug use: No  . Sexual activity: Yes    Partners: Male    Birth  control/protection: None  Lifestyle  . Physical activity:    Days per week: Not on file    Minutes per session: Not on file  . Stress: Not on file  Relationships  . Social connections:    Talks on phone: Not on file    Gets together: Not on file    Attends religious service: Not on file    Active member of club or organization: Not on file    Attends meetings of clubs or organizations: Not on file    Relationship status: Not on file  . Intimate partner violence:    Fear of current or ex partner: Not on file    Emotionally abused: Not on file    Physically abused: Not on file    Forced sexual activity: Not on file  Other Topics Concern  . Not on file  Social History Narrative   Regular exercise-yes    Current Outpatient Medications on File Prior to Visit  Medication Sig Dispense Refill  . ACCU-CHEK AVIVA PLUS test strip USE TO CHECK BLOOD SUGAR THREE TIMES A DAY 300 each 0  . ACCU-CHEK SOFTCLIX LANCETS lancets USE TO TEST BLOOD SUGAR THREE TIMES A DAY 300 each 0  . albuterol (PROAIR HFA) 108 (90 BASE) MCG/ACT inhaler Inhale 2 puffs into the lungs every 6 (six) hours as needed for wheezing or shortness of breath.     Marland Kitchen albuterol (PROVENTIL) (2.5 MG/3ML) 0.083% nebulizer solution Take 2.5 mg by nebulization every 6 (six) hours as needed for wheezing.    Marland Kitchen ALPRAZolam (XANAX) 0.5 MG tablet Take 0.5 mg 2 (two) times daily as needed by mouth for anxiety or sleep.     Marland Kitchen amLODipine (NORVASC) 5 MG tablet Take 5 mg by mouth daily.    Marland Kitchen aspirin EC 81 MG tablet Take 81 mg by mouth daily.    Marland Kitchen azaTHIOprine (IMURAN) 50 MG tablet Take 100 mg by mouth 2 (two) times daily.     . Blood Glucose Monitoring Suppl (ACCU-CHEK AVIVA) device Use as instructed 1 each 0  . buPROPion (WELLBUTRIN) 100 MG tablet Take 100 mg by mouth every morning.    . carvedilol (COREG) 12.5 MG tablet Take 3 tablets (37.5 mg total) 2 (two) times daily with a meal by mouth. (Patient taking differently: Take 12.5 mg by mouth 2  (two) times daily with a meal. ) 180 tablet 0  . cetirizine (ZYRTEC) 10 MG tablet Take 10 mg by mouth daily as needed for allergies.     Marland Kitchen clopidogrel (PLAVIX) 75 MG tablet Take 75 mg by mouth daily.    . clotrimazole-betamethasone (LOTRISONE) cream Apply 1 application daily as needed topically (itching/breakouts on feet).     . cycloSPORINE (RESTASIS) 0.05 % ophthalmic emulsion Place 1 drop  into both eyes 3 (three) times daily.    . diclofenac sodium (VOLTAREN) 1 % GEL Apply 2-4 g 4 (four) times daily as needed topically (joint pain).    Marland Kitchen doxepin (SINEQUAN) 25 MG capsule Take 25 mg by mouth daily.     . DULoxetine (CYMBALTA) 60 MG capsule Take 60 mg by mouth at bedtime.    Marland Kitchen etonogestrel-ethinyl estradiol (NUVARING) 0.12-0.015 MG/24HR vaginal ring Place 1 each every 28 (twenty-eight) days vaginally. Insert vaginally and leave in place for 3 consecutive weeks, then remove for 1 week.    . famotidine (PEPCID) 20 MG tablet Take 20 mg by mouth 2 (two) times daily.    Marland Kitchen glucose blood (ACCU-CHEK AVIVA PLUS) test strip Used to check blood sugar 5x daily. 450 each 2  . glucose blood (ACCU-CHEK GUIDE) test strip Used to check blood sugars 5x daily. 200 each 12  . hydroxychloroquine (PLAQUENIL) 200 MG tablet Take 200 mg by mouth 2 (two) times daily.     . Insulin Syringe-Needle U-100 (COMFORT ASSIST INSULIN SYRINGE) 31G X 5/16" 1 ML MISC Use one daily with insulin 90 each 4  . Lancets (ACCU-CHEK MULTICLIX) lancets Used to check blood sugars daily up to 5 times. 204 each 12  . megestrol (MEGACE) 40 MG tablet Take 40 mg by mouth daily.  3  . metFORMIN (GLUCOPHAGE-XR) 500 MG 24 hr tablet Take 1,000 mg by mouth 2 (two) times daily.    . mometasone-formoterol (DULERA) 100-5 MCG/ACT AERO Inhale 2 puffs into the lungs 2 (two) times daily.    . Olopatadine HCl (PAZEO) 0.7 % SOLN Place 1 drop daily as needed into both eyes (itching/irritation). Pazeo    . oxybutynin (DITROPAN-XL) 10 MG 24 hr tablet Take 10 mg by  mouth daily.    . Polyvinyl Alcohol-Povidone (REFRESH OP) Place 1 drop 2 (two) times daily into both eyes.     . potassium chloride SA (K-DUR,KLOR-CON) 20 MEQ tablet Take 20 mEq by mouth daily.     . pravastatin (PRAVACHOL) 10 MG tablet Take 10 mg by mouth daily.    . predniSONE (DELTASONE) 1 MG tablet Take 3 mg by mouth daily.     . pregabalin (LYRICA) 75 MG capsule Take 75 mg 3 (three) times daily by mouth.     . sacubitril-valsartan (ENTRESTO) 97-103 MG Take 1 tablet by mouth 2 (two) times daily.    Marland Kitchen topiramate (TOPAMAX) 25 MG tablet Take 75 mg by mouth at bedtime.    . torsemide (DEMADEX) 20 MG tablet 40 mg in AM and 20 mg in PM (Patient taking differently: 20 mg daily. ) 30 tablet 0  . traMADol (ULTRAM) 50 MG tablet TAKE 1 TO 2 TABLETS BY MOUTH EVERY 6 HOURS AS NEEDED FOR PAIN**PA REQ** (Patient taking differently: Take 100 mg by mouth every 6 (six) hours as needed for moderate pain. ) 60 tablet 0   No current facility-administered medications on file prior to visit.     Allergies  Allergen Reactions  . Metoclopramide Other (See Comments)    Developed restless leg, akathisia type limb movements.   . Codeine Itching, Rash and Other (See Comments)  . Penicillins Itching    Has patient had a PCN reaction causing immediate rash, facial/tongue/throat swelling, SOB or lightheadedness with hypotension: No Has patient had a PCN reaction causing severe rash involving mucus membranes or skin necrosis: No Has patient had a PCN reaction that required hospitalization: No Has patient had a PCN reaction occurring within the last  10 years:yes If all of the above answers are "NO", then may proceed with Cephalosporin use.  Marland Kitchen Hydrocodone Rash  . Percocet [Oxycodone-Acetaminophen] Hives and Rash    Tolerates dilaudid    Family History  Problem Relation Age of Onset  . Diabetes Mother   . Asthma Mother   . Urticaria Mother   . Hypertension Mother   . Breast cancer Maternal Aunt 27        currently 56  . Stomach cancer Maternal Uncle        dx 33s; deceased  . Prostate cancer Maternal Uncle        deceased 46s  . Cancer Maternal Aunt        unk. primary  . Cancer Maternal Aunt        unk. primary  . Ovarian cancer Maternal Aunt        deceased 83s  . Hypertension Other   . Stroke Other   . Arthritis Other   . Ovarian cancer Sister 70       maternal half-sister; RAD51C positive  . Cancer Paternal Aunt        unk. primary; deceased 38s  . Prostate cancer Paternal Uncle        deceased 67s  . Breast cancer Cousin        deceased 79s; daughter of mat uncle with stomach ca  . Breast cancer Maternal Aunt        deceased 24s  . Asthma Brother   . Asthma Brother   . Allergic rhinitis Neg Hx   . Angioedema Neg Hx     BP 130/80 (BP Location: Left Arm, Patient Position: Sitting, Cuff Size: Large)   Pulse 98   Ht 5' 8"  (1.727 m)   Wt (!) 416 lb 12.8 oz (189.1 kg)   LMP 03/24/2017   SpO2 97%   BMI 63.37 kg/m   Review of Systems She denies hypoglycemia.      Objective:   Physical Exam VITAL SIGNS:  See vs page.  GENERAL: no distress.   Pulses: dorsalis pedis intact bilat.   MSK: no deformity of the feet.   CV: trace bilat leg edema.   Skin:  no ulcer on the feet, but the skin is dry.  normal color and temp on the feet.   Neuro: sensation is intact to touch on the feet.    Lab Results  Component Value Date   HGBA1C 8.8 (A) 08/20/2018      Assessment & Plan:  Insulin-requiring type 2 DM, with PN: worse Sciatica: this can affect A1c.  As she is tapering, we'll increase the insulin just slightly  Patient Instructions  check your blood sugar 4 times a day: before the 3 meals, and at bedtime.  also check if you have symptoms of your blood sugar being too high or too low.  please keep a record of the readings and bring it to your next appointment here (or you can bring the meter itself).  You can write it on any piece of paper.  please call us sooner if your  blood sugar goes below 70, or if you have a lot of readings over 200. Please continue the same metformin and Byetta, and: increase the NPH insulin to 170 units each morning.   On this type of insulin schedule, you should eat meals on a regular schedule (especially lunch).  If a meal is missed or significantly delayed, your blood sugar could go low.   Please come back  for a follow-up appointment in 2 months.

## 2018-08-20 NOTE — Patient Instructions (Addendum)
check your blood sugar 4 times a day: before the 3 meals, and at bedtime.  also check if you have symptoms of your blood sugar being too high or too low.  please keep a record of the readings and bring it to your next appointment here (or you can bring the meter itself).  You can write it on any piece of paper.  please call us sooner if your blood sugar goes below 70, or if you have a lot of readings over 200. Please continue the same metformin and Byetta, and: increase the NPH insulin to 170 units each morning.   On this type of insulin schedule, you should eat meals on a regular schedule (especially lunch).  If a meal is missed or significantly delayed, your blood sugar could go low.   Please come back for a follow-up appointment in 2 months.

## 2018-08-21 ENCOUNTER — Telehealth: Payer: Self-pay | Admitting: Endocrinology

## 2018-08-21 ENCOUNTER — Other Ambulatory Visit: Payer: Self-pay

## 2018-08-21 DIAGNOSIS — Z794 Long term (current) use of insulin: Secondary | ICD-10-CM

## 2018-08-21 DIAGNOSIS — E1143 Type 2 diabetes mellitus with diabetic autonomic (poly)neuropathy: Secondary | ICD-10-CM

## 2018-08-21 MED ORDER — EXENATIDE 10 MCG/0.04ML ~~LOC~~ SOPN: 10.0000 ug | PEN_INJECTOR | Freq: Two times a day (BID) | SUBCUTANEOUS | 11 refills | Status: DC

## 2018-08-21 MED ORDER — INSULIN PEN NEEDLE 32G X 4 MM MISC: 10 refills | Status: DC

## 2018-08-21 NOTE — Telephone Encounter (Signed)
Per Rep from [facility] they received a prescription for Byetta with note to also add pen needles. Per Rep there has to be a separate Rx for the pen needles and medication. So at this point they need separate Rx for both

## 2018-08-21 NOTE — Telephone Encounter (Signed)
exenatide (BYETTA) 10 MCG/0.04ML SOPN injection 2.4 mL 11 08/21/2018    Sig - Route: Inject 0.04 mLs (10 mcg total) into the skin 2 (two) times daily with a meal. And pen needles 2/day - Subcutaneous   Sent to pharmacy as: exenatide (BYETTA) 10 MCG/0.04ML Solution Pen-injector injection   E-Prescribing Status: Receipt confirmed by pharmacy (08/21/2018 3:05 PM EST)    Above confirmation received from [place], [place]

## 2018-08-21 NOTE — Telephone Encounter (Signed)
Insulin Pen Needle (EASY TOUCH PEN NEEDLES) 32G X 4 MM MISC 100 each 10 08/21/2018    Sig: USE TO INJECT INSULIN TWICE DAILY   Sent to pharmacy as: Insulin Pen Needle (EASY TOUCH PEN NEEDLES) 32G X 4 MM Misc   E-Prescribing Status: Receipt confirmed by pharmacy (08/21/2018 3:29 PM EST)    Above confirmation received from [facility]

## 2018-08-21 NOTE — Telephone Encounter (Signed)
Patients states RX's where sent to the wrong pharmacy. Patient states [facility] was not very nice on doing the transfer for her and they stated for her to call our office. Please send to: [facility] - [address], [place]  Please Advise, thanks

## 2018-10-03 ENCOUNTER — Ambulatory Visit: Payer: Self-pay | Admitting: Cardiology

## 2018-10-11 ENCOUNTER — Other Ambulatory Visit: Payer: Self-pay | Admitting: Endocrinology

## 2018-10-11 NOTE — Progress Notes (Deleted)
Subjective:   [name], female    DOB: [birth year], 48 y.o.   MRN: [id number]  [name] Bill, MD:  No chief complaint on file.   HPI: [name]  is a 48 y.o. AA female  with Sjogren's syndrome, SLE, chronic systolic heart failure now with improved EF to 45-50% on echo in 11/2017, morbid obesity, obesity hypoventilation syndrome, uncontrolled type 2 DM, hypertension, h/o CVA in 2014.  She is on guideline directed medical therapy for heart failure and tolerating well. She has had coronary angiogram in 2017 with normal coronary arteries.  Patient was last seen by [place] 2 months ago. During the interim, she reports that she was started on high dose prednisone for sciatica and has since had worsening shortness of breath and leg edema and has also started gaining weight. She had echocardiogram performed at [facility] on 06/10/2018 that was unchanged from previous echo in may 2019. LVEF 45%. She has had improvement in her sciatica pain with steroid; however, is wanting to wean off.  Additional reasons for visit:  Follow-up for Hypertension    Past Medical History:  Diagnosis Date  . Acute renal failure (HCC)     Intractable nausea vomiting secondary to diabetic gastroparesis causing dehydration and acute renal failure /notes 04/01/2013  . Anginal pain ([place])   . Anxiety   . Bell's palsy 08/09/2010  . CAP (community acquired pneumonia) 10/2011   Archie Endo 11/08/2011  . Chest pain 01/13/2014  . CHF (congestive heart failure) ([place])   . Diabetic gastroparesis associated with type 2 diabetes mellitus ([place])    this is presumed diagnoses, not confirmed by any studies.   . Eczema   . Family history of malignant neoplasm of breast   . Family history of malignant neoplasm of ovary   . GERD (gastroesophageal reflux disease)   . High cholesterol   . Hypertension   . Liver mass 2014   biopsied 03/2013 at [facility], not malignant.  is to undergo radiologic ablation of the mass in  sept/October 2014.   . Lupus ([name])   . Migraines    "maybe a couple times/yr" (04/01/2013)  . Obesity   . Obstructive sleep apnea on CPAP 2011  . On home oxygen therapy    "2L 24/7" (02/28/2015)  . SJOGREN'S SYNDROME 08/09/2010  . SLE (systemic lupus erythematosus) ([place])    Archie Endo 12/01/2010  . Stroke [facility]) 2010; 10/2012   "left side is still weak from it, never fully regained full strength; no additions from stroke 10/2012"    Past Surgical History:  Procedure Laterality Date  . BREAST CYST EXCISION Left 08/2005   epidermoid  . CARDIAC CATHETERIZATION  02/07/12  . ECTOPIC PREGNANCY SURGERY  1999  . ECTOPIC PREGNANCY SURGERY  1999  . ESOPHAGOGASTRODUODENOSCOPY N/A 02/26/2013   Procedure: ESOPHAGOGASTRODUODENOSCOPY (EGD);  Surgeon: Irene Shipper, MD;  Location: [facility] ENDOSCOPY;  Service: Endoscopy;  Laterality: N/A;  . ESOPHAGOGASTRODUODENOSCOPY N/A 03/02/2015   Procedure: ESOPHAGOGASTRODUODENOSCOPY (EGD);  Surgeon: Milus Banister, MD;  Location: [place];  Service: Endoscopy;  Laterality: N/A;  . HERNIA REPAIR    . LEFT AND RIGHT HEART CATHETERIZATION WITH CORONARY ANGIOGRAM N/A 02/07/2012   Procedure: LEFT AND RIGHT HEART CATHETERIZATION WITH CORONARY ANGIOGRAM;  Surgeon: Laverda Page, MD;  Location: [facility] CATH LAB;  Service: Cardiovascular;  Laterality: N/A;  . LIVER BIOPSY  03/2013   liver mass/medical hx noted above  . MUSCLE BIOPSY     for lupus/notes 12/01/2010  .  UMBILICAL HERNIA REPAIR  1980's    Family History  Problem Relation Age of Onset  . Diabetes Mother   . Asthma Mother   . Urticaria Mother   . Hypertension Mother   . Breast cancer Maternal Aunt 74       currently 53  . Stomach cancer Maternal Uncle        dx 15s; deceased  . Prostate cancer Maternal Uncle        deceased 68s  . Cancer Maternal Aunt        unk. primary  . Cancer Maternal Aunt        unk. primary  . Ovarian cancer Maternal Aunt        deceased 69s  . Hypertension Other   . Stroke Other   .  Arthritis Other   . Ovarian cancer Sister 28       maternal half-sister; RAD51C positive  . Cancer Paternal Aunt        unk. primary; deceased 38s  . Prostate cancer Paternal Uncle        deceased 30s  . Breast cancer Cousin        deceased 55s; daughter of mat uncle with stomach ca  . Breast cancer Maternal Aunt        deceased 22s  . Asthma Brother   . Asthma Brother   . Allergic rhinitis Neg Hx   . Angioedema Neg Hx     Social History   Socioeconomic History  . Marital status: Single    Spouse name: Not on file  . Number of children: Not on file  . Years of education: Not on file  . Highest education level: Not on file  Occupational History    Employer: UNEMPLOYED    Comment: student  Social Needs  . Financial resource strain: Not on file  . Food insecurity:    Worry: Not on file    Inability: Not on file  . Transportation needs:    Medical: Not on file    Non-medical: Not on file  Tobacco Use  . Smoking status: Former Smoker    Years: 3.00    Types: Cigarettes    Last attempt to quit: 06/02/1991    Years since quitting: 27.3  . Smokeless tobacco: Never Used  Substance and Sexual Activity  . Alcohol use: No    Alcohol/week: 0.0 standard drinks  . Drug use: No  . Sexual activity: Yes    Partners: Male    Birth control/protection: None  Lifestyle  . Physical activity:    Days per week: Not on file    Minutes per session: Not on file  . Stress: Not on file  Relationships  . Social connections:    Talks on phone: Not on file    Gets together: Not on file    Attends religious service: Not on file    Active member of club or organization: Not on file    Attends meetings of clubs or organizations: Not on file    Relationship status: Not on file  . Intimate partner violence:    Fear of current or ex partner: Not on file    Emotionally abused: Not on file    Physically abused: Not on file    Forced sexual activity: Not on file  Other Topics Concern  . Not  on file  Social History Narrative   Regular exercise-yes    No outpatient medications have been marked as taking for the 10/12/18 encounter (  Appointment) with Miquel Dunn, NP.     Review of Systems  Constitution: Negative for decreased appetite, malaise/fatigue, weight gain and weight loss.  Eyes: Negative for visual disturbance.  Cardiovascular: Positive for dyspnea on exertion (improved). Negative for chest pain, claudication, leg swelling, orthopnea, palpitations and syncope.  Respiratory: Negative for hemoptysis and wheezing.   Endocrine: Negative for cold intolerance and heat intolerance.  Hematologic/Lymphatic: Does not bruise/bleed easily.  Skin: Negative for nail changes.  Musculoskeletal: Positive for joint pain (right knee). Negative for muscle weakness and myalgias.  Gastrointestinal: Negative for abdominal pain, change in bowel habit, nausea and vomiting.  Neurological: Negative for difficulty with concentration, dizziness, focal weakness and headaches.  Psychiatric/Behavioral: Negative for altered mental status and suicidal ideas.  All other systems reviewed and are negative.      Objective:     Last menstrual period 03/24/2017.  Cardiac studies:  EKG Tina 03/26/2018: Sinus tachycardia with occasional Premature ventricular complexes Low voltage QRS. ST & T wave abnormality, consider inferior ischemia. Abnormal ECG.  Echocardiogram 12/19/2017: Left ventricle cavity is mildly dilated. Mild concentric hypertrophy of the left ventricle. Mild decrease in global wall motion. Doppler evidence of grade I (impaired) diastolic dysfunction, normal LAP. Calculated EF 47%. Left atrial cavity is moderately dilated. Mild (Grade I) mitral regurgitation. Mild prolapse of the mitral valve leaflets. Mild tricuspid regurgitation. No evidence of pulmonary hypertension. No significant change compared to previous study dated 03/09/2017.  Vascular Ultrasound Lower Extremity  Venous 03/26/2018: Right: There is no evidence of deep vein thrombosis in the lower extremity. No cystic structure found in the popliteal fossa. Left: No evidence of common femoral vein obstruction.  Carotid duplex 03/06/2014: The vertebral arteries appear patent with antegrade [name]. Findings consistent with 1-39 percent stenosis involving theright internal carotid artery and the left internal carotidartery.  Coronary angiogram 02/07/2012: Normal coronary arteries and LVEF. Left dominant circulation.   Recent Labs:  *** Lab Results  Component Value Date   HGBA1C 8.8 (A) 08/20/2018   CMP Latest Ref Rng & Units 03/26/2018  Glucose 70 - 99 mg/dL 145(H)  BUN 6 - 20 mg/dL 9  Creatinine 0.44 - 1.00 mg/dL 1.10(H)  Sodium 135 - 145 mmol/L 142  Potassium 3.5 - 5.1 mmol/L 3.8  Chloride 98 - 111 mmol/L 112(H)  CO2 22 - 32 mmol/L 20(L)  eGFR Af Amer >53m/min >60  Calcium 8.9 - 10.3 mg/dL 8.9  Total Protein 6.5 - 8.1 g/dL -  Total Bilirubin 0.3 - 1.2 mg/dL -  Alkaline Phos 38 - 126 U/L -  AST 15 - 41 U/L -  ALT 14 - 54 U/L -   CBC Latest Ref Rng & Units 03/26/2018  WBC 4.0 - 10.5 K/uL 8.7  Hemoglobin 12.0 - 15.0 g/dL 11.8(L)  Hematocrit 36.0 - 46.0 % 38.0  Platelets 150 - 400 K/uL 333   BNP    Component Value Date/Time   BNP 139.3 (H) 03/26/2018 1119    01/17/2017: Total cholesterol 149, triglycerides 129, HDL 66, LDL 57.   Physical Exam  Constitutional: She appears well-developed and well-nourished. No distress.  Morbidly obese  HENT:  Head: Atraumatic.  Eyes: Conjunctivae are normal.  Neck: Neck supple. No JVD present. No thyromegaly present.  Short neck and difficult to evaluate JVP  Cardiovascular: Normal rate, regular rhythm, normal heart sounds and intact distal pulses. Exam reveals no gallop.  No murmur heard. Pulses:      Carotid pulses are 2+ on the right side and 2+ on  the left side.      Dorsalis pedis pulses are 2+ on the right side and 2+ on the left side.        Posterior tibial pulses are 2+ on the right side and 2+ on the left side.  Femoral and popliteal pulse difficult to feel due to patient's body habitus.   Pulmonary/Chest: Effort normal and breath sounds normal.  Abdominal: Soft. Bowel sounds are normal.  Obese. Pannus present  Musculoskeletal: Normal range of motion.        General: No edema.  Neurological: She is alert.  Skin: Skin is warm and dry.  Psychiatric: She has a normal mood and affect.        Assessment & Recommendations:  There are no diagnoses linked to this encounter.  Mayumi Summerson is an African-American female with history of Sjogren syndrome, Systemic lupus, chronic shortness of breath, morbid obesity, obesity hypoventilation syndrome and uncontrolled diabetes, history of stroke on 10/09/2012 with very mild residual slurred speach and left arm weakness, on nocturnal O2 for obesity hypoventilation. She has normal coronary arteries per cardiac catheterization in 2017. Admitted to [facility] on 09/20/2017 with acute on chronic systolic and diastolic heart failure and also community acquired pneumonia. Now on Entresto.  I have discussed recently obtained echocardiogram, LVEF has remained stable and improved since being on Entresto, will continue the same. She has had worsening shortness of breath and leg edema; however, has minimal leg edema and no crackles noted by exam. I suspect her symtpoms are related to high dose prednisone as she does not appear fluid overloaded. I have asked her to discuss with her PCP about weaning off of prednisone. Dyspnea is also complicated by obesity with hypoventilation. I have again discussed needed diet changes for weight loss. Bariatric procedure was discussed.  Blood pressure is elevated today; however, was confused if she should be on amlodipine and is not currently taking this. I have sent in a new prescription for this. I will also discontinue her hydralazine and isosorbide and switch  her to Bidil 2 tablet TID. Samples were provided and she will contact the office in the next 1-2 weeks to let me know if she is tolerating so that I can send in prescription for this. She will come in 3 weeks for BP check, otherwise, will see her back for follow up on CHF in 3 months.  *I have discussed this case with Dr. Einar Gip and he personally examined the patient and participated in formulating the plan.Jeri Lager, MSN, APRN, FNP-C [facility], [place] Office: [phone] Fax: [phone]

## 2018-10-12 ENCOUNTER — Ambulatory Visit: Payer: Self-pay | Admitting: Cardiology

## 2018-10-17 NOTE — Progress Notes (Signed)
Subjective:   [name], female    DOB: 01/09/1971, 48 y.o.   MRN: [id number]  Bartholome Bill, MD:  Chief Complaint  Patient presents with  . Congestive Heart Failure  . Hypertension  . Follow-up    HPI: [name]  is a 48 y.o. AA female  with Sjogren's syndrome, SLE, chronic systolic heart failure now with improved EF to 45-50% on echo in Nov 2019, morbid obesity, obesity hypoventilation syndrome, uncontrolled type 2 DM, hypertension, h/o CVA in 2014.  She is on guideline directed medical therapy for heart failure and tolerating well. She has had coronary angiogram in 2017 with normal coronary arteries.  Since last seen by [place] 3 months ago, symptoms have remained stable. No worsening dyspnea on exertion. Denies any significant leg edema. Tolerating medications well. She continues to have sciatica nerve pain and continues to be on low dose prednisone. She reports that exercising is difficult due to her sciatica pain.     Past Medical History:  Diagnosis Date  . Acute renal failure (HCC)     Intractable nausea vomiting secondary to diabetic gastroparesis causing dehydration and acute renal failure /notes 04/01/2013  . Anginal pain ([place])   . Anxiety   . Bell's palsy 08/09/2010  . CAP (community acquired pneumonia) 10/2011   Archie Endo 11/08/2011  . Chest pain 01/13/2014  . CHF (congestive heart failure) ([place])   . Diabetic gastroparesis associated with type 2 diabetes mellitus ([place])    this is presumed diagnoses, not confirmed by any studies.   . Eczema   . Family history of malignant neoplasm of breast   . Family history of malignant neoplasm of ovary   . GERD (gastroesophageal reflux disease)   . High cholesterol   . Hypertension   . Liver mass 2014   biopsied 03/2013 at [facility], not malignant.  is to undergo radiologic ablation of the mass in sept/October 2014.   . Lupus ([place])   . Migraines    "maybe a couple times/yr" (04/01/2013)  . Obesity   .  Obstructive sleep apnea on CPAP 2011  . On home oxygen therapy    "2L 24/7" (02/28/2015)  . SJOGREN'S SYNDROME 08/09/2010  . SLE (systemic lupus erythematosus) ([place])    Archie Endo 12/01/2010  . Stroke [facility]) 2010; 10/2012   "left side is still weak from it, never fully regained full strength; no additions from stroke 10/2012"    Past Surgical History:  Procedure Laterality Date  . BREAST CYST EXCISION Left 08/2005   epidermoid  . CARDIAC CATHETERIZATION  02/07/12  . ECTOPIC PREGNANCY SURGERY  1999  . ECTOPIC PREGNANCY SURGERY  1999  . ESOPHAGOGASTRODUODENOSCOPY N/A 02/26/2013   Procedure: ESOPHAGOGASTRODUODENOSCOPY (EGD);  Surgeon: Irene Shipper, MD;  Location: [facility] ENDOSCOPY;  Service: Endoscopy;  Laterality: N/A;  . ESOPHAGOGASTRODUODENOSCOPY N/A 03/02/2015   Procedure: ESOPHAGOGASTRODUODENOSCOPY (EGD);  Surgeon: Milus Banister, MD;  Location: [place];  Service: Endoscopy;  Laterality: N/A;  . HERNIA REPAIR    . LEFT AND RIGHT HEART CATHETERIZATION WITH CORONARY ANGIOGRAM N/A 02/07/2012   Procedure: LEFT AND RIGHT HEART CATHETERIZATION WITH CORONARY ANGIOGRAM;  Surgeon: Laverda Page, MD;  Location: [facility] CATH LAB;  Service: Cardiovascular;  Laterality: N/A;  . LIVER BIOPSY  03/2013   liver mass/medical hx noted above  . MUSCLE BIOPSY     for lupus/notes 12/01/2010  . UMBILICAL HERNIA REPAIR  1980's    Family History  Problem Relation Age of Onset  .  Diabetes Mother   . Asthma Mother   . Urticaria Mother   . Hypertension Mother   . Breast cancer Maternal Aunt 21       currently 19  . Stomach cancer Maternal Uncle        dx 1s; deceased  . Prostate cancer Maternal Uncle        deceased 31s  . Cancer Maternal Aunt        unk. primary  . Cancer Maternal Aunt        unk. primary  . Ovarian cancer Maternal Aunt        deceased 42s  . Hypertension Other   . Stroke Other   . Arthritis Other   . Ovarian cancer Sister 74       maternal half-sister; RAD51C positive  . Cancer Paternal Aunt         unk. primary; deceased 58s  . Prostate cancer Paternal Uncle        deceased 64s  . Breast cancer Cousin        deceased 1s; daughter of mat uncle with stomach ca  . Breast cancer Maternal Aunt        deceased 17s  . Asthma Brother   . Asthma Brother   . Allergic rhinitis Neg Hx   . Angioedema Neg Hx     Social History   Socioeconomic History  . Marital status: Single    Spouse name: Not on file  . Number of children: 1  . Years of education: Not on file  . Highest education level: Not on file  Occupational History    Employer: UNEMPLOYED    Comment: student  Social Needs  . Financial resource strain: Not on file  . Food insecurity:    Worry: Not on file    Inability: Not on file  . Transportation needs:    Medical: Not on file    Non-medical: Not on file  Tobacco Use  . Smoking status: Former Smoker    Years: 3.00    Types: Cigarettes    Last attempt to quit: 06/02/1991    Years since quitting: 27.3  . Smokeless tobacco: Never Used  Substance and Sexual Activity  . Alcohol use: No    Alcohol/week: 0.0 standard drinks  . Drug use: No  . Sexual activity: Yes    Partners: Male    Birth control/protection: None  Lifestyle  . Physical activity:    Days per week: Not on file    Minutes per session: Not on file  . Stress: Not on file  Relationships  . Social connections:    Talks on phone: Not on file    Gets together: Not on file    Attends religious service: Not on file    Active member of club or organization: Not on file    Attends meetings of clubs or organizations: Not on file    Relationship status: Not on file  . Intimate partner violence:    Fear of current or ex partner: Not on file    Emotionally abused: Not on file    Physically abused: Not on file    Forced sexual activity: Not on file  Other Topics Concern  . Not on file  Social History Narrative   Regular exercise-yes    Current Meds  Medication Sig  . ACCU-CHEK AVIVA PLUS test  strip USE TO CHECK BLOOD SUGAR THREE TIMES A DAY  . ACCU-CHEK SOFTCLIX LANCETS lancets USE TO TEST BLOOD SUGAR  THREE TIMES A DAY  . albuterol (PROAIR HFA) 108 (90 BASE) MCG/ACT inhaler Inhale 2 puffs into the lungs every 6 (six) hours as needed for wheezing or shortness of breath.   Marland Kitchen albuterol (PROVENTIL) (2.5 MG/3ML) 0.083% nebulizer solution Take 2.5 mg by nebulization every 6 (six) hours as needed for wheezing.  Marland Kitchen ALPRAZolam (XANAX) 0.5 MG tablet Take 0.5 mg 2 (two) times daily as needed by mouth for anxiety or sleep.   Marland Kitchen amLODipine (NORVASC) 5 MG tablet Take 5 mg by mouth daily.  Marland Kitchen aspirin EC 81 MG tablet Take 81 mg by mouth daily.  Marland Kitchen azaTHIOprine (IMURAN) 50 MG tablet Take 100 mg by mouth 2 (two) times daily.   . benzonatate (TESSALON) 100 MG capsule Take by mouth continuous as needed for cough.  Marland Kitchen buPROPion (WELLBUTRIN) 100 MG tablet Take 100 mg by mouth every morning.  . carvedilol (COREG) 12.5 MG tablet Take 3 tablets (37.5 mg total) 2 (two) times daily with a meal by mouth. (Patient taking differently: Take 12.5 mg by mouth 2 (two) times daily with a meal. )  . cetirizine (ZYRTEC) 10 MG tablet Take 10 mg by mouth daily as needed for allergies.   Marland Kitchen clopidogrel (PLAVIX) 75 MG tablet Take 75 mg by mouth daily.  . clotrimazole-betamethasone (LOTRISONE) cream Apply 1 application daily as needed topically (itching/breakouts on feet).   . cycloSPORINE (RESTASIS) 0.05 % ophthalmic emulsion Place 1 drop into both eyes 3 (three) times daily.  . diclofenac sodium (VOLTAREN) 1 % GEL Apply 2-4 g 4 (four) times daily as needed topically (joint pain).  Marland Kitchen doxepin (SINEQUAN) 25 MG capsule Take 25 mg by mouth daily.   . DULoxetine (CYMBALTA) 60 MG capsule Take 60 mg by mouth at bedtime.  Marland Kitchen etonogestrel-ethinyl estradiol (NUVARING) 0.12-0.015 MG/24HR vaginal ring Place 1 each every 28 (twenty-eight) days vaginally. Insert vaginally and leave in place for 3 consecutive weeks, then remove for 1 week.  Marland Kitchen  exenatide (BYETTA) 10 MCG/0.04ML SOPN injection Inject 0.04 mLs (10 mcg total) into the skin 2 (two) times daily with a meal. And pen needles 2/day  . famotidine (PEPCID) 20 MG tablet Take 20 mg by mouth 2 (two) times daily.  . fluticasone (FLONASE) 50 MCG/ACT nasal spray Place 2 sprays into both nostrils continuous as needed for allergies or rhinitis.  Marland Kitchen glucose blood (ACCU-CHEK AVIVA PLUS) test strip Used to check blood sugar 5x daily.  Marland Kitchen glucose blood (ACCU-CHEK GUIDE) test strip Used to check blood sugars 5x daily.  . hydroxychloroquine (PLAQUENIL) 200 MG tablet Take 200 mg by mouth 2 (two) times daily.   . insulin NPH Human (HUMULIN N) 100 UNIT/ML injection Inject 1.7 mLs (170 Units total) into the skin every morning. And syringes 3/day (Patient taking differently: Inject 160 Units into the skin every morning. And syringes 3/day)  . Insulin Pen Needle (EASY TOUCH PEN NEEDLES) 32G X 4 MM MISC USE TO INJECT INSULIN TWICE DAILY  . Insulin Syringe-Needle U-100 (EASY TOUCH INSULIN SYRINGE) 31G X 5/16" 1 ML MISC USE AS DIRECTED THREE TIMES A DAY  . isosorbide-hydrALAZINE (BIDIL) 20-37.5 MG tablet Take 2 tablets by mouth 3 (three) times daily.  . Lancets (ACCU-CHEK MULTICLIX) lancets Used to check blood sugars daily up to 5 times.  . megestrol (MEGACE) 40 MG tablet Take 40 mg by mouth daily.  . metFORMIN (GLUCOPHAGE-XR) 500 MG 24 hr tablet Take 1,000 mg by mouth 2 (two) times daily.  . mometasone-formoterol (DULERA) 100-5 MCG/ACT AERO Inhale 2 puffs  into the lungs 2 (two) times daily.  Marland Kitchen oxybutynin (DITROPAN-XL) 10 MG 24 hr tablet Take 20 mg by mouth daily.   . Polyvinyl Alcohol-Povidone (REFRESH OP) Place 1 drop 2 (two) times daily into both eyes.   . potassium chloride SA (K-DUR,KLOR-CON) 20 MEQ tablet Take 20 mEq by mouth daily.   . pravastatin (PRAVACHOL) 10 MG tablet Take 10 mg by mouth daily.  . predniSONE (DELTASONE) 1 MG tablet Take 3 mg by mouth daily.   . pregabalin (LYRICA) 75 MG capsule  Take 75 mg 3 (three) times daily by mouth.   . psyllium (METAMUCIL MULTIHEALTH FIBER) 58.6 % powder Take 1 packet by mouth 2 (two) times daily.  . sacubitril-valsartan (ENTRESTO) 97-103 MG Take 1 tablet by mouth 2 (two) times daily.  Marland Kitchen topiramate (TOPAMAX) 25 MG tablet Take 75 mg by mouth at bedtime.  . torsemide (DEMADEX) 20 MG tablet 40 mg in AM and 20 mg in PM (Patient taking differently: 20 mg daily. )  . traMADol (ULTRAM) 50 MG tablet TAKE 1 TO 2 TABLETS BY MOUTH EVERY 6 HOURS AS NEEDED FOR PAIN**PA REQ** (Patient taking differently: Take 100 mg by mouth every 6 (six) hours as needed for moderate pain. )     Review of Systems  Constitution: Positive for weight gain. Negative for decreased appetite, malaise/fatigue and weight loss.  Eyes: Negative for visual disturbance.  Cardiovascular: Positive for dyspnea on exertion (improved). Negative for chest pain, claudication, leg swelling, orthopnea, palpitations and syncope.  Respiratory: Negative for hemoptysis and wheezing.   Endocrine: Negative for cold intolerance and heat intolerance.  Hematologic/Lymphatic: Negative for bleeding problem. Does not bruise/bleed easily.  Skin: Negative for nail changes.  Musculoskeletal: Positive for joint pain (right knee). Negative for muscle weakness and myalgias.  Gastrointestinal: Negative for abdominal pain, change in bowel habit, nausea and vomiting.  Neurological: Negative for difficulty with concentration, dizziness, focal weakness and headaches.  Psychiatric/Behavioral: Negative for altered mental status and suicidal ideas.  All other systems reviewed and are negative.      Objective:     Blood pressure (!) 182/108, pulse 100, height _0  (1.702 m), weight (!) 409 lb (185.5 kg), last menstrual period 03/24/2017, SpO2 98 %.  Cardiac studies:  EKG [place] 03/26/2018: Sinus tachycardia with occasional Premature ventricular complexes Low voltage QRS. ST & T wave abnormality, consider  inferior ischemia. Abnormal ECG.  Echocardiogram 12/19/2017: Left ventricle cavity is mildly dilated. Mild concentric hypertrophy of the left ventricle. Mild decrease in global wall motion. Doppler evidence of grade I (impaired) diastolic dysfunction, normal LAP. Calculated EF 47%. Left atrial cavity is moderately dilated. Mild (Grade I) mitral regurgitation. Mild prolapse of the mitral valve leaflets. Mild tricuspid regurgitation. No evidence of pulmonary hypertension. No significant change compared to previous study dated 03/09/2017.  Vascular Ultrasound Lower Extremity Venous 03/26/2018: Right: There is no evidence of deep vein thrombosis in the lower extremity. No cystic structure found in the popliteal fossa. Left: No evidence of common femoral vein obstruction.  Carotid duplex 03/06/2014: The vertebral arteries appear patent with antegrade flow. Findings consistent with 1-39 percent stenosis involving theright internal carotid artery and the left internal carotidartery.  Coronary angiogram 02/07/2012: Normal coronary arteries and LVEF. Left dominant circulation.   Recent Labs:   Lab Results  Component Value Date   HGBA1C 8.8 (A) 08/20/2018   CMP Latest Ref Rng & Units 03/26/2018  Glucose 70 - 99 mg/dL 145(H)  BUN 6 - 20 mg/dL 9  Creatinine 0.44 -  1.00 mg/dL 1.10(H)  Sodium 135 - 145 mmol/L 142  Potassium 3.5 - 5.1 mmol/L 3.8  Chloride 98 - 111 mmol/L 112(H)  CO2 22 - 32 mmol/L 20(L)  eGFR Af Amer >57m/min >60  Calcium 8.9 - 10.3 mg/dL 8.9  Total Protein 6.5 - 8.1 g/dL -  Total Bilirubin 0.3 - 1.2 mg/dL -  Alkaline Phos 38 - 126 U/L -  AST 15 - 41 U/L -  ALT 14 - 54 U/L -   CBC Latest Ref Rng & Units 03/26/2018  WBC 4.0 - 10.5 K/uL 8.7  Hemoglobin 12.0 - 15.0 g/dL 11.8(L)  Hematocrit 36.0 - 46.0 % 38.0  Platelets 150 - 400 K/uL 333   BNP    Component Value Date/Time   BNP 139.3 (H) 03/26/2018 1119    01/17/2017: Total cholesterol 149, triglycerides 129, HDL 66, LDL  57.   Physical Exam  Constitutional: She appears well-developed and well-nourished. No distress.  Morbidly obese  HENT:  Head: Atraumatic.  Eyes: Conjunctivae are normal.  Neck: Neck supple. No JVD present. No thyromegaly present.  Short neck and difficult to evaluate JVP  Cardiovascular: Normal rate, regular rhythm, normal heart sounds and intact distal pulses. Exam reveals no gallop.  No murmur heard. Pulses:      Carotid pulses are 2+ on the right side and 2+ on the left side.      Dorsalis pedis pulses are 2+ on the right side and 2+ on the left side.       Posterior tibial pulses are 2+ on the right side and 2+ on the left side.  Femoral and popliteal pulse difficult to feel due to patient's body habitus.   Pulmonary/Chest: Effort normal and breath sounds normal.  Abdominal: Soft. Bowel sounds are normal.  Obese. Pannus present  Musculoskeletal: Normal range of motion.        General: No edema.  Neurological: She is alert.  Skin: Skin is warm and dry.  Psychiatric: She has a normal mood and affect.        Assessment & Recommendations:  1. Chronic combined systolic and diastolic heart failure (HCC) Symptomatically remains stable. No worsening leg edema or dyspnea on exertion. She has been mostly sedentary over the last few months due to sciatica pain, I have discussed water aerobics with her to help improve her exercise capacity and also her sciatic nerve pain. No clinical evidence of heart failure. On appropriate medical therapy.   2. Essential hypertension Markedly elevated today and also last week at other physician appt. I will add Aldactone 25 mg daily. Will check BMP in 10 days for surveillance. Encouraged her to avoid high potassium foods. Will discontinue potassium supplement and reevaluate in 10 days with labs.  3. Morbid obesity with BMI of 60.0-69.9, adult ([place] Unfortunately, she has gained about 20 lbs over the last 3 months. We have discussed needed diet  changes, ways to incorporate exercise, and portion control. Suspect significantly contributing to multiple comorbidity.  4. Uncontrolled type 2 diabetes mellitus, with long[place]term [place] use of insulin [place] Being followed and managed by Endocrinology. Diet modifications were discussed to help with controlling this. She has also developed diabetic ulcer of her thigh that is being managed by wound care.  Plan: As stated above, will start Aldactone and closely watch kidney function and potassium level. No clinical evidence of decompensated heart failure.  I will see her back in 6 weeks for follow up on hypertension.  AJeri Lager MSN, APRN, FNP-C [facility]  [place] Office: [phone] Fax: [phone]

## 2018-10-18 ENCOUNTER — Telehealth: Payer: Self-pay

## 2018-10-18 ENCOUNTER — Ambulatory Visit (INDEPENDENT_AMBULATORY_CARE_PROVIDER_SITE_OTHER): Payer: Medicaid Other | Admitting: Cardiology

## 2018-10-18 ENCOUNTER — Other Ambulatory Visit: Payer: Self-pay

## 2018-10-18 ENCOUNTER — Encounter: Payer: Self-pay | Admitting: Cardiology

## 2018-10-18 VITALS — BP 182/108 | HR 100 | Ht 67.0 in | Wt >= 6400 oz

## 2018-10-18 DIAGNOSIS — Z794 Long term (current) use of insulin: Secondary | ICD-10-CM

## 2018-10-18 DIAGNOSIS — I10 Essential (primary) hypertension: Secondary | ICD-10-CM | POA: Diagnosis not present

## 2018-10-18 DIAGNOSIS — I5042 Chronic combined systolic (congestive) and diastolic (congestive) heart failure: Secondary | ICD-10-CM | POA: Diagnosis not present

## 2018-10-18 DIAGNOSIS — E1165 Type 2 diabetes mellitus with hyperglycemia: Secondary | ICD-10-CM

## 2018-10-18 DIAGNOSIS — IMO0002 Reserved for concepts with insufficient information to code with codable children: Secondary | ICD-10-CM

## 2018-10-18 DIAGNOSIS — Z6841 Body Mass Index (BMI) 40.0 and over, adult: Secondary | ICD-10-CM

## 2018-10-18 MED ORDER — SPIRONOLACTONE 25 MG PO TABS: 25.0000 mg | ORAL_TABLET | Freq: Every day | ORAL | 1 refills | Status: DC

## 2018-10-18 MED ORDER — AMLODIPINE BESYLATE 5 MG PO TABS: 5.0000 mg | ORAL_TABLET | Freq: Every day | ORAL | 1 refills | Status: DC

## 2018-10-19 ENCOUNTER — Encounter: Payer: Self-pay | Admitting: Endocrinology

## 2018-10-19 ENCOUNTER — Other Ambulatory Visit: Payer: Self-pay

## 2018-10-19 ENCOUNTER — Ambulatory Visit (INDEPENDENT_AMBULATORY_CARE_PROVIDER_SITE_OTHER): Payer: Medicaid Other | Admitting: Endocrinology

## 2018-10-19 VITALS — BP 148/92 | HR 102 | Ht 67.0 in | Wt >= 6400 oz

## 2018-10-19 DIAGNOSIS — Z794 Long term (current) use of insulin: Secondary | ICD-10-CM | POA: Diagnosis not present

## 2018-10-19 DIAGNOSIS — I5023 Acute on chronic systolic (congestive) heart failure: Secondary | ICD-10-CM

## 2018-10-19 DIAGNOSIS — E1143 Type 2 diabetes mellitus with diabetic autonomic (poly)neuropathy: Secondary | ICD-10-CM

## 2018-10-19 LAB — POCT GLYCOSYLATED HEMOGLOBIN (HGB A1C): HEMOGLOBIN A1C: 11.1 % — AB (ref 4.0–5.6)

## 2018-10-19 MED ORDER — ACCU-CHEK GUIDE W/DEVICE KIT: 1.0000 | PACK | Freq: Once | 0 refills | Status: DC

## 2018-10-19 MED ORDER — INSULIN NPH (HUMAN) (ISOPHANE) 100 UNIT/ML ~~LOC~~ SUSP: 200.0000 [IU] | SUBCUTANEOUS | 11 refills | Status: DC

## 2018-10-19 MED ORDER — SPIRONOLACTONE 25 MG PO TABS: 25.0000 mg | ORAL_TABLET | Freq: Every day | ORAL | 3 refills | Status: DC

## 2018-10-19 MED ORDER — AMLODIPINE BESYLATE 5 MG PO TABS: 5.0000 mg | ORAL_TABLET | Freq: Every day | ORAL | 3 refills | Status: DC

## 2018-10-19 MED ORDER — GLUCOSE BLOOD VI STRP: ORAL_STRIP | 2 refills | Status: DC

## 2018-10-19 MED ORDER — ACCU-CHEK SOFTCLIX LANCETS MISC: 2 refills | Status: DC

## 2018-10-19 NOTE — Patient Instructions (Addendum)
check your blood sugar 4 times a day: before the 3 meals, and at bedtime.  also check if you have symptoms of your blood sugar being too high or too low.  please keep a record of the readings and bring it to your next appointment here (or you can bring the meter itself).  You can write it on any piece of paper.  please call us sooner if your blood sugar goes below 70, or if you have a lot of readings over 200. Please continue the same metformin and Byetta, and: increase the NPH insulin to 200 units each morning.   I have sent a prescription to your pharmacy, for a new meter.   On this type of insulin schedule, you should eat meals on a regular schedule (especially lunch).  If a meal is missed or significantly delayed, your blood sugar could go low.   Please come back for a follow-up appointment in 2 months.

## 2018-10-19 NOTE — Progress Notes (Signed)
Subjective:    Patient ID: [name], female    DOB: 04/30/1971, 48 y.o.   MRN: [id number]  HPI Pt returns for f/u of diabetes mellitus: DM type: Insulin-requiring type 2 Dx'ed: 8182 Complications: polyneuropathy and gastroparesis.  Therapy: insulin (since 2014), Byetta, and metformin.   GDM: never.  DKA: never Severe hypoglycemia: never.  Pancreatitis: never.  Other: she is too ill to undergo weight-loss surgery; she takes qd insulin, after poor results with multiple daily injections.   Interval history: she is again tapering prednisone (it was increased due to sciatica--she has again reduced to 3 mg qd).  She says she never misses the insulin.  She takes 170 units qam.  no cbg record, but states cbg's are in the 100's.   Past Medical History:  Diagnosis Date  . Acute renal failure (HCC)     Intractable nausea vomiting secondary to diabetic gastroparesis causing dehydration and acute renal failure /notes 04/01/2013  . Anginal pain ([place])   . Anxiety   . Bell's palsy 08/09/2010  . CAP (community acquired pneumonia) 10/2011   Archie Endo 11/08/2011  . Chest pain 01/13/2014  . CHF (congestive heart failure) ([place])   . Diabetic gastroparesis associated with type 2 diabetes mellitus ([place])    this is presumed diagnoses, not confirmed by any studies.   . Eczema   . Family history of malignant neoplasm of breast   . Family history of malignant neoplasm of ovary   . GERD (gastroesophageal reflux disease)   . High cholesterol   . Hypertension   . Liver mass 2014   biopsied 03/2013 at [facility], not malignant.  is to undergo radiologic ablation of the mass in sept/October 2014.   . Lupus ([place])   . Migraines    "maybe a couple times/yr" (04/01/2013)  . Obesity   . Obstructive sleep apnea on CPAP 2011  . On home oxygen therapy    "2L 24/7" (02/28/2015)  . SJOGREN'S SYNDROME 08/09/2010  . SLE (systemic lupus erythematosus) ([place])    Archie Endo 12/01/2010  . Stroke [facility]) 2010; 10/2012   "left side  is still weak from it, never fully regained full strength; no additions from stroke 10/2012"    Past Surgical History:  Procedure Laterality Date  . BREAST CYST EXCISION Left 08/2005   epidermoid  . CARDIAC CATHETERIZATION  02/07/12  . ECTOPIC PREGNANCY SURGERY  1999  . ECTOPIC PREGNANCY SURGERY  1999  . ESOPHAGOGASTRODUODENOSCOPY N/A 02/26/2013   Procedure: ESOPHAGOGASTRODUODENOSCOPY (EGD);  Surgeon: Irene Shipper, MD;  Location: [facility] ENDOSCOPY;  Service: Endoscopy;  Laterality: N/A;  . ESOPHAGOGASTRODUODENOSCOPY N/A 03/02/2015   Procedure: ESOPHAGOGASTRODUODENOSCOPY (EGD);  Surgeon: Milus Banister, MD;  Location: [facility];  Service: Endoscopy;  Laterality: N/A;  . HERNIA REPAIR    . LEFT AND RIGHT HEART CATHETERIZATION WITH CORONARY ANGIOGRAM N/A 02/07/2012   Procedure: LEFT AND RIGHT HEART CATHETERIZATION WITH CORONARY ANGIOGRAM;  Surgeon: Laverda Page, MD;  Location: [facility] CATH LAB;  Service: Cardiovascular;  Laterality: N/A;  . LIVER BIOPSY  03/2013   liver mass/medical hx noted above  . MUSCLE BIOPSY     for lupus/notes 12/01/2010  . UMBILICAL HERNIA REPAIR  1980's    Social History   Socioeconomic History  . Marital status: Single    Spouse name: Not on file  . Number of children: 1  . Years of education: Not on file  . Highest education level: Not on file  Occupational History    Employer: UNEMPLOYED  Comment: student  Social Needs  . Financial resource strain: Not on file  . Food insecurity:    Worry: Not on file    Inability: Not on file  . Transportation needs:    Medical: Not on file    Non-medical: Not on file  Tobacco Use  . Smoking status: Former Smoker    Years: 3.00    Types: Cigarettes    Last attempt to quit: 06/02/1991    Years since quitting: 27.4  . Smokeless tobacco: Never Used  Substance and Sexual Activity  . Alcohol use: No    Alcohol/week: 0.0 standard drinks  . Drug use: No  . Sexual activity: Yes    Partners: Male    Birth  control/protection: None  Lifestyle  . Physical activity:    Days per week: Not on file    Minutes per session: Not on file  . Stress: Not on file  Relationships  . Social connections:    Talks on phone: Not on file    Gets together: Not on file    Attends religious service: Not on file    Active member of club or organization: Not on file    Attends meetings of clubs or organizations: Not on file    Relationship status: Not on file  . Intimate partner violence:    Fear of current or ex partner: Not on file    Emotionally abused: Not on file    Physically abused: Not on file    Forced sexual activity: Not on file  Other Topics Concern  . Not on file  Social History Narrative   Regular exercise-yes    Current Outpatient Medications on File Prior to Visit  Medication Sig Dispense Refill  . albuterol (PROAIR HFA) 108 (90 BASE) MCG/ACT inhaler Inhale 2 puffs into the lungs every 6 (six) hours as needed for wheezing or shortness of breath.     Marland Kitchen albuterol (PROVENTIL) (2.5 MG/3ML) 0.083% nebulizer solution Take 2.5 mg by nebulization every 6 (six) hours as needed for wheezing.    Marland Kitchen ALPRAZolam (XANAX) 0.5 MG tablet Take 0.5 mg 2 (two) times daily as needed by mouth for anxiety or sleep.     Marland Kitchen aspirin EC 81 MG tablet Take 81 mg by mouth daily.    Marland Kitchen azaTHIOprine (IMURAN) 50 MG tablet Take 100 mg by mouth 2 (two) times daily.     . benzonatate (TESSALON) 100 MG capsule Take by mouth continuous as needed for cough.    Marland Kitchen buPROPion (WELLBUTRIN) 100 MG tablet Take 100 mg by mouth every morning.    . carvedilol (COREG) 12.5 MG tablet Take 3 tablets (37.5 mg total) 2 (two) times daily with a meal by mouth. (Patient taking differently: Take 12.5 mg by mouth 2 (two) times daily with a meal. ) 180 tablet 0  . cetirizine (ZYRTEC) 10 MG tablet Take 10 mg by mouth daily as needed for allergies.     Marland Kitchen clopidogrel (PLAVIX) 75 MG tablet Take 75 mg by mouth daily.    . clotrimazole-betamethasone  (LOTRISONE) cream Apply 1 application daily as needed topically (itching/breakouts on feet).     . cycloSPORINE (RESTASIS) 0.05 % ophthalmic emulsion Place 1 drop into both eyes 3 (three) times daily.    . diclofenac sodium (VOLTAREN) 1 % GEL Apply 2-4 g 4 (four) times daily as needed topically (joint pain).    Marland Kitchen doxepin (SINEQUAN) 25 MG capsule Take 25 mg by mouth daily.     . DULoxetine (  CYMBALTA) 60 MG capsule Take 60 mg by mouth at bedtime.    Marland Kitchen etonogestrel-ethinyl estradiol (NUVARING) 0.12-0.015 MG/24HR vaginal ring Place 1 each every 28 (twenty-eight) days vaginally. Insert vaginally and leave in place for 3 consecutive weeks, then remove for 1 week.    Marland Kitchen exenatide (BYETTA) 10 MCG/0.04ML SOPN injection Inject 0.04 mLs (10 mcg total) into the skin 2 (two) times daily with a meal. And pen needles 2/day 2.4 mL 11  . famotidine (PEPCID) 20 MG tablet Take 20 mg by mouth 2 (two) times daily.    . fluticasone (FLONASE) 50 MCG/ACT nasal spray Place 2 sprays into both nostrils continuous as needed for allergies or rhinitis.    . hydroxychloroquine (PLAQUENIL) 200 MG tablet Take 200 mg by mouth 2 (two) times daily.     . Insulin Pen Needle (EASY TOUCH PEN NEEDLES) 32G X 4 MM MISC USE TO INJECT INSULIN TWICE DAILY 100 each 10  . Insulin Syringe-Needle U-100 (EASY TOUCH INSULIN SYRINGE) 31G X 5/16" 1 ML MISC USE AS DIRECTED THREE TIMES A DAY 100 each 10  . isosorbide-hydrALAZINE (BIDIL) 20-37.5 MG tablet Take 2 tablets by mouth 3 (three) times daily.    . megestrol (MEGACE) 40 MG tablet Take 40 mg by mouth daily.  3  . metFORMIN (GLUCOPHAGE-XR) 500 MG 24 hr tablet Take 1,000 mg by mouth 2 (two) times daily.    . mometasone-formoterol (DULERA) 100-5 MCG/ACT AERO Inhale 2 puffs into the lungs 2 (two) times daily.    . Olopatadine HCl (PAZEO) 0.7 % SOLN Place 1 drop daily as needed into both eyes (itching/irritation). Pazeo    . oxybutynin (DITROPAN-XL) 10 MG 24 hr tablet Take 20 mg by mouth daily.     .  Polyvinyl Alcohol-Povidone (REFRESH OP) Place 1 drop 2 (two) times daily into both eyes.     . pravastatin (PRAVACHOL) 10 MG tablet Take 10 mg by mouth daily.    . predniSONE (DELTASONE) 1 MG tablet Take 3 mg by mouth daily.     . pregabalin (LYRICA) 75 MG capsule Take 75 mg 3 (three) times daily by mouth.     . psyllium (METAMUCIL MULTIHEALTH FIBER) 58.6 % powder Take 1 packet by mouth 2 (two) times daily.    . sacubitril-valsartan (ENTRESTO) 97-103 MG Take 1 tablet by mouth 2 (two) times daily.    Marland Kitchen topiramate (TOPAMAX) 25 MG tablet Take 75 mg by mouth at bedtime.    . torsemide (DEMADEX) 20 MG tablet 40 mg in AM and 20 mg in PM (Patient taking differently: 20 mg daily. ) 30 tablet 0  . traMADol (ULTRAM) 50 MG tablet TAKE 1 TO 2 TABLETS BY MOUTH EVERY 6 HOURS AS NEEDED FOR PAIN**PA REQ** (Patient taking differently: Take 100 mg by mouth every 6 (six) hours as needed for moderate pain. ) 60 tablet 0   No current facility-administered medications on file prior to visit.     Allergies  Allergen Reactions  . Metoclopramide Other (See Comments)    Developed restless leg, akathisia type limb movements.   . Codeine Itching, Rash and Other (See Comments)  . Penicillins Itching    Has patient had a PCN reaction causing immediate rash, facial/tongue/throat swelling, SOB or lightheadedness with hypotension: No Has patient had a PCN reaction causing severe rash involving mucus membranes or skin necrosis: No Has patient had a PCN reaction that required hospitalization: No Has patient had a PCN reaction occurring within the last 10 years:yes If all of the  above answers are "NO", then may proceed with Cephalosporin use.  Marland Kitchen Hydrocodone Rash  . Percocet [Oxycodone-Acetaminophen] Hives and Rash    Tolerates dilaudid    Family History  Problem Relation Age of Onset  . Diabetes Mother   . Asthma Mother   . Urticaria Mother   . Hypertension Mother   . Breast cancer Maternal Aunt 59       currently  66  . Stomach cancer Maternal Uncle        dx 81s; deceased  . Prostate cancer Maternal Uncle        deceased 51s  . Cancer Maternal Aunt        unk. primary  . Cancer Maternal Aunt        unk. primary  . Ovarian cancer Maternal Aunt        deceased 12s  . Hypertension Other   . Stroke Other   . Arthritis Other   . Ovarian cancer Sister 92       maternal half-sister; RAD51C positive  . Cancer Paternal Aunt        unk. primary; deceased 26s  . Prostate cancer Paternal Uncle        deceased 33s  . Breast cancer Cousin        deceased 48s; daughter of mat uncle with stomach ca  . Breast cancer Maternal Aunt        deceased 62s  . Asthma Brother   . Asthma Brother   . Allergic rhinitis Neg Hx   . Angioedema Neg Hx     BP (!) 148/92 (BP Location: Right Arm, Patient Position: Sitting, Cuff Size: Large)   Pulse (!) 102   Ht 5' 7"  (1.702 m)   Wt (!) 424 lb 9.6 oz (192.6 kg)   LMP 03/24/2017   SpO2 95%   BMI 66.50 kg/m    Review of Systems She denies hypoglycemia.      Objective:   Physical Exam  VITAL SIGNS:  See vs page GENERAL: no distress.  Morbid obesity. Pulses: dorsalis pedis intact bilat.   MSK: no deformity of the feet CV: trace bilat leg edema Skin:  no ulcer on the feet.  normal color and temp on the feet. Neuro: sensation is intact to touch on the feet   Lab Results  Component Value Date   HGBA1C 11.1 (A) 10/19/2018   Lab Results  Component Value Date   CREATININE 1.10 (H) 03/26/2018   BUN 9 03/26/2018   NA 142 03/26/2018   K 3.8 03/26/2018   CL 112 (H) 03/26/2018   CO2 20 (L) 03/26/2018        Assessment & Plan:  Insulin-requiring type 2 DM, with PN: worse Sciatica: steroid px is affecting glycemic control. Edema: This limits rx options   Patient Instructions  check your blood sugar 4 times a day: before the 3 meals, and at bedtime.  also check if you have symptoms of your blood sugar being too high or too low.  please keep a record of  the readings and bring it to your next appointment here (or you can bring the meter itself).  You can write it on any piece of paper.  please call us sooner if your blood sugar goes below 70, or if you have a lot of readings over 200. Please continue the same metformin and Byetta, and: increase the NPH insulin to 200 units each morning.   I have sent a prescription to your pharmacy, for a  new meter.   On this type of insulin schedule, you should eat meals on a regular schedule (especially lunch).  If a meal is missed or significantly delayed, your blood sugar could go low.   Please come back for a follow-up appointment in 2 months.

## 2018-10-25 ENCOUNTER — Other Ambulatory Visit: Payer: Self-pay

## 2018-10-25 ENCOUNTER — Telehealth: Payer: Self-pay | Admitting: Endocrinology

## 2018-10-25 DIAGNOSIS — E1143 Type 2 diabetes mellitus with diabetic autonomic (poly)neuropathy: Secondary | ICD-10-CM

## 2018-10-25 DIAGNOSIS — Z794 Long term (current) use of insulin: Secondary | ICD-10-CM

## 2018-10-25 MED ORDER — GLUCOSE BLOOD VI STRP: ORAL_STRIP | 2 refills | Status: DC

## 2018-10-25 MED ORDER — ACCU-CHEK AVIVA DEVI: 0 refills | Status: AC

## 2018-10-25 NOTE — Telephone Encounter (Signed)
Per [facility], "Caller stated she would like a call back when possible, needs to speak with the nurse, was told to call back if she didn't receive her diabetic meter, [facility] says they never received the prescription for the accucheck aviva plus"

## 2018-10-28 ENCOUNTER — Other Ambulatory Visit: Payer: Self-pay | Admitting: Endocrinology

## 2018-10-29 ENCOUNTER — Other Ambulatory Visit: Payer: Self-pay | Admitting: Cardiology

## 2018-10-31 NOTE — Telephone Encounter (Signed)
Spoke to pt and she sated that she called [facility] and that they are going to mail her a meter.

## 2018-11-01 ENCOUNTER — Other Ambulatory Visit: Payer: Self-pay | Admitting: Cardiology

## 2018-11-02 LAB — BASIC METABOLIC PANEL
BUN/Creatinine Ratio: 9 (ref 9–23)
BUN: 8 mg/dL (ref 6–24)
CO2: 22 mmol/L (ref 20–29)
Calcium: 9.3 mg/dL (ref 8.7–10.2)
Chloride: 103 mmol/L (ref 96–106)
Creatinine, Ser: 0.9 mg/dL (ref 0.57–1.00)
GFR calc Af Amer: 87 mL/min/{1.73_m2} (ref >59)
GFR calc non Af Amer: 76 mL/min/{1.73_m2} (ref >59)
Glucose: 273 mg/dL — ABNORMAL HIGH (ref 65–99)
Potassium: 4.2 mmol/L (ref 3.5–5.2)
Sodium: 140 mmol/L (ref 134–144)

## 2018-11-05 NOTE — Progress Notes (Signed)
Lvm

## 2018-11-05 NOTE — Progress Notes (Signed)
Please let patient know that kidney function and potassium level are normal. Continue with current medications

## 2018-11-08 ENCOUNTER — Other Ambulatory Visit: Payer: Self-pay | Admitting: Endocrinology

## 2018-11-08 DIAGNOSIS — E1143 Type 2 diabetes mellitus with diabetic autonomic (poly)neuropathy: Secondary | ICD-10-CM

## 2018-11-08 DIAGNOSIS — Z794 Long term (current) use of insulin: Secondary | ICD-10-CM

## 2018-11-08 IMAGING — CT CT ANGIO CHEST
2 of 11 series · 18 of 46 positions shown · IV contrast (iopamidol)
Comparison: 09/20/2017

CLINICAL DATA: PE suspected, intermediate

EXAM:
CT ANGIOGRAPHY CHEST WITH CONTRAST
TECHNIQUE: Multidetector CT imaging of the chest was performed using the
standard protocol during bolus administration of intravenous
contrast. Multiplanar CT image reconstructions and MIPs were
obtained to evaluate the vascular anatomy.
CONTRAST:  100mL 8QOWOS-MDE IOPAMIDOL (8QOWOS-MDE) INJECTION 76%

[Series 11: thins · axial · 0.69mm/px · z∈[+983,+1231]mm · 15 of 276 slices shown]
[im 14/276  lung]
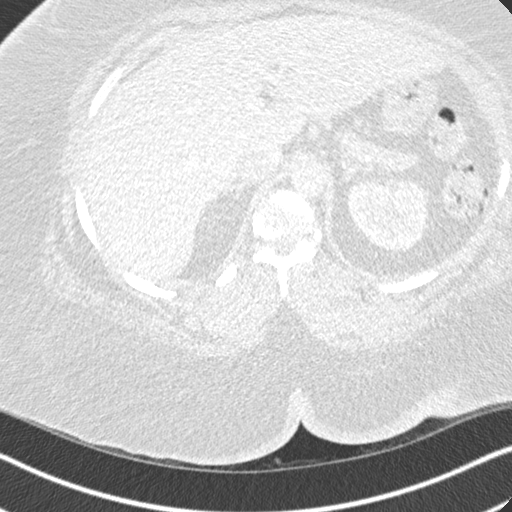
[im 28/276  soft-tissue]
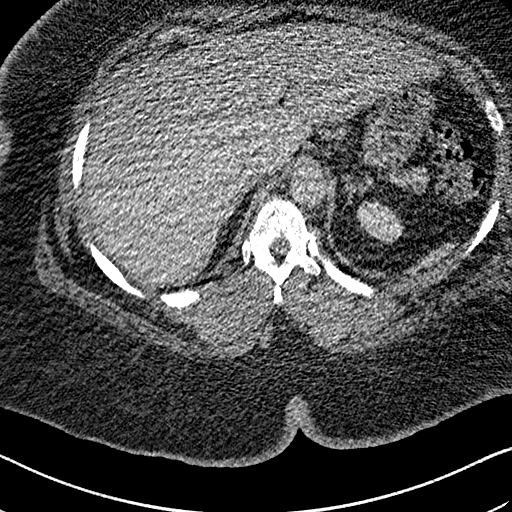
[im 56/276  lung]
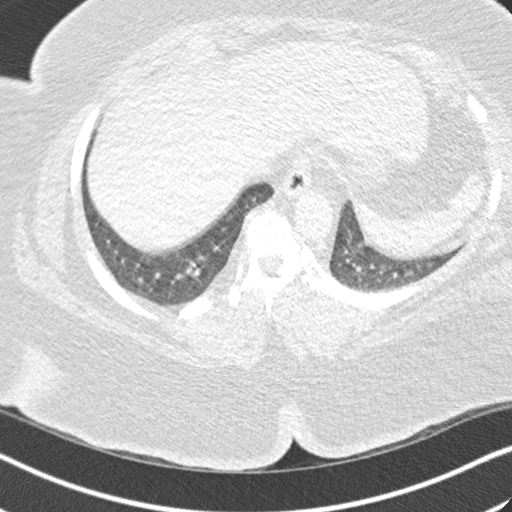
[im 69/276  soft-tissue]
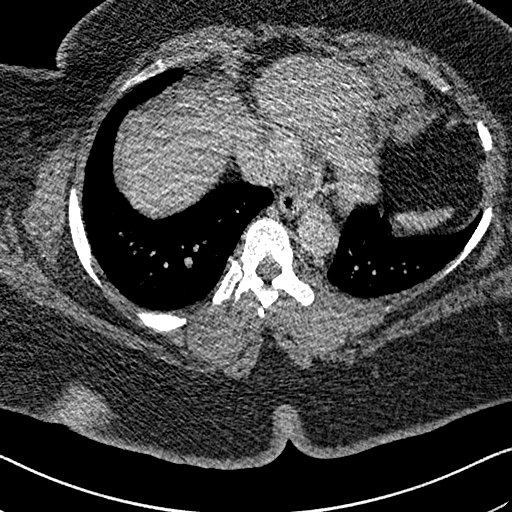
[im 83/276  lung]
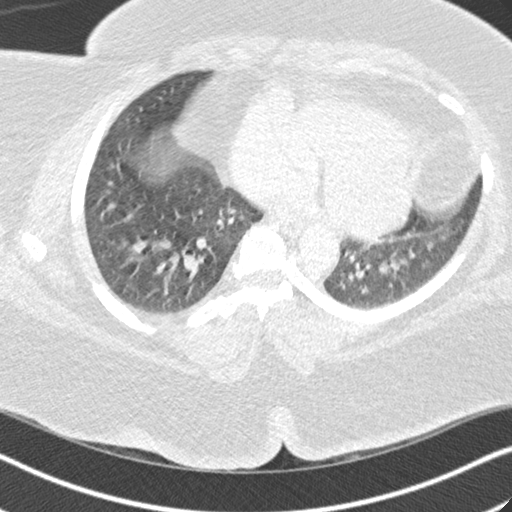
[im 97/276  soft-tissue]
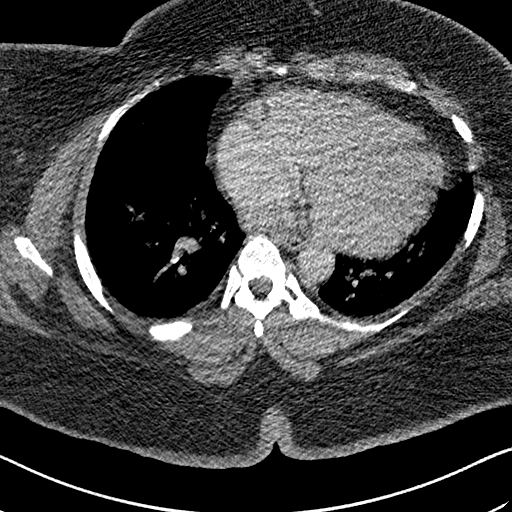
[im 124/276  lung]
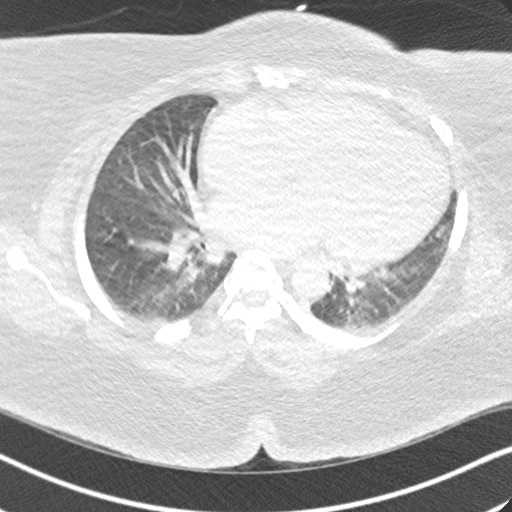
[im 138/276  soft-tissue]
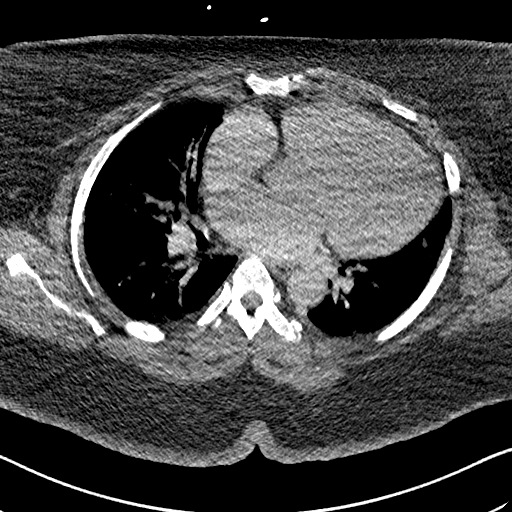
[im 152/276  lung]
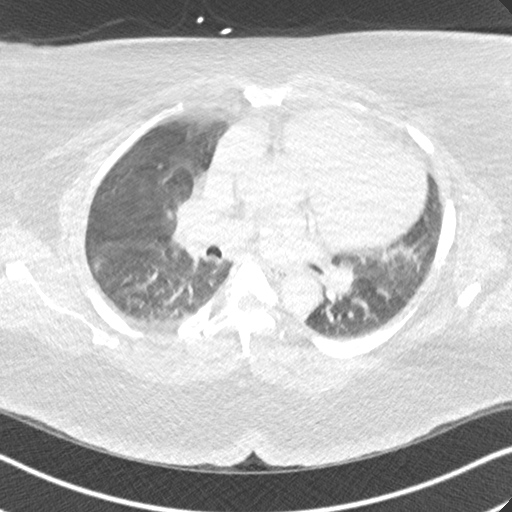
[im 179/276  soft-tissue]
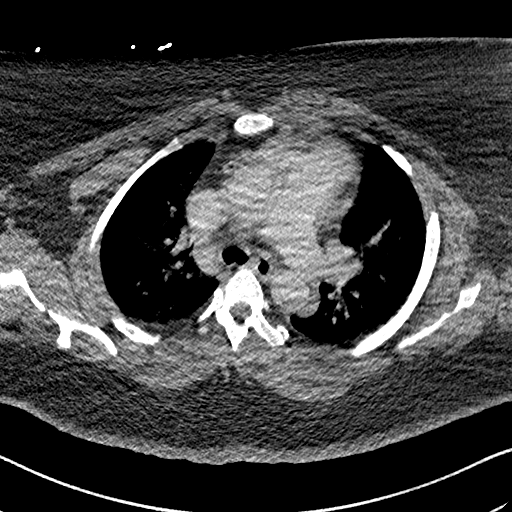
[im 193/276  lung]
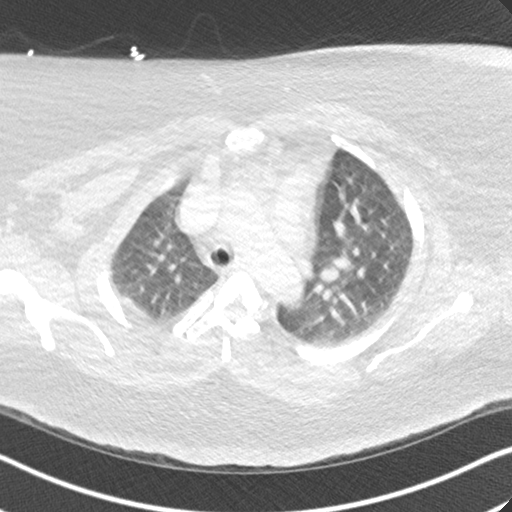
[im 207/276  soft-tissue]
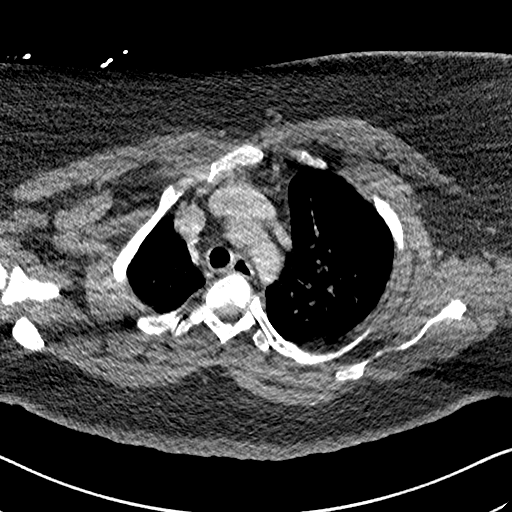
[im 221/276  lung]
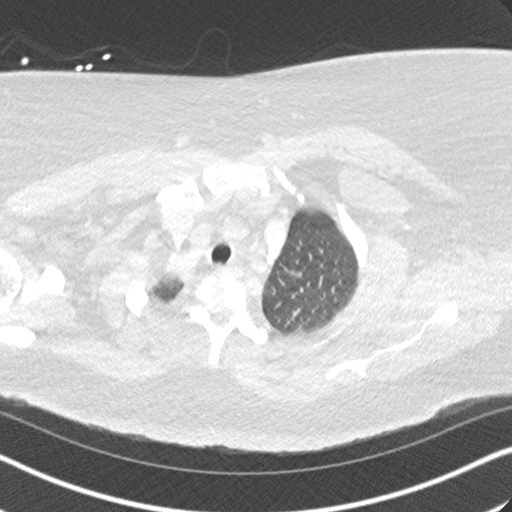
[im 248/276  soft-tissue]
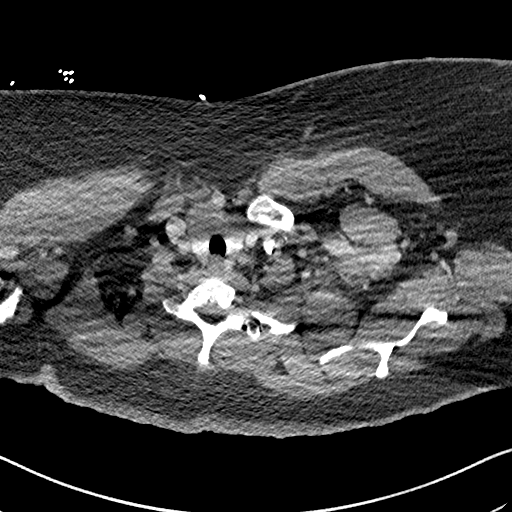
[im 262/276  lung]
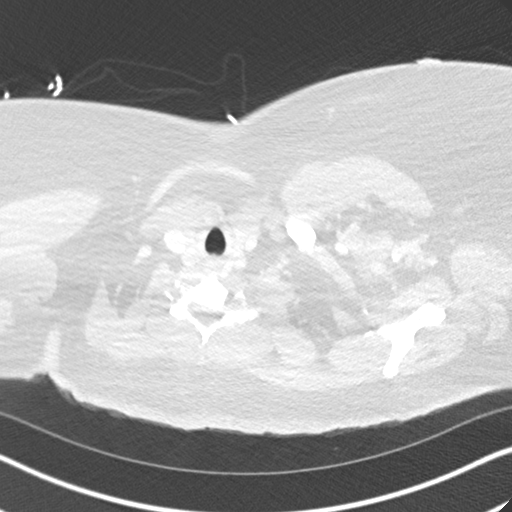

[Series 13: coronal mpr · coronal · 0.57mm/px · 3 of 177 slices shown]
[im 45/177  soft-tissue]
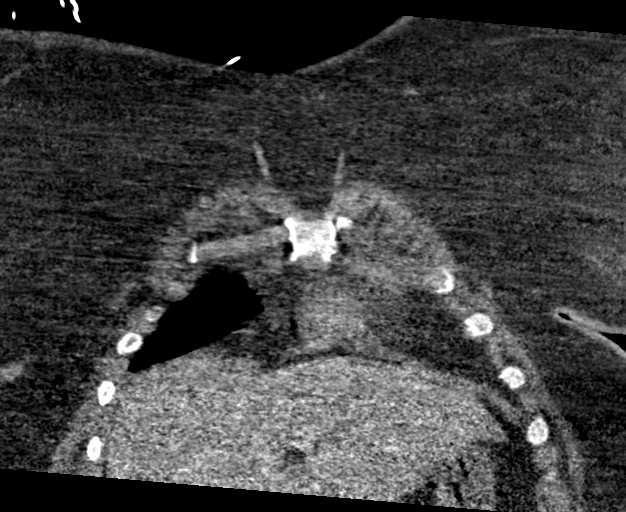
[im 89/177  soft-tissue]
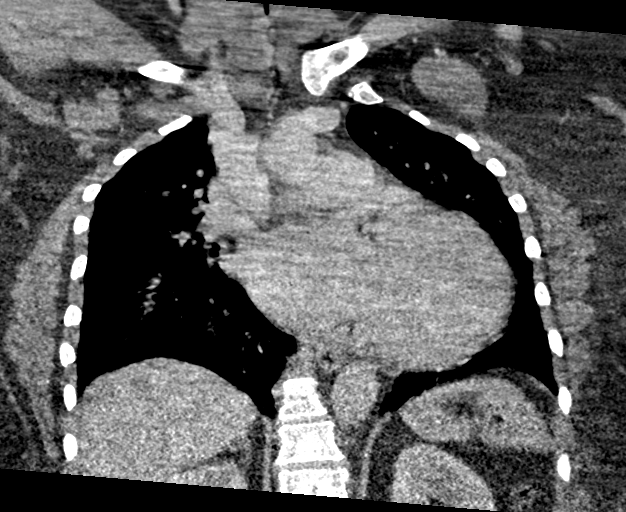
[im 133/177  soft-tissue]
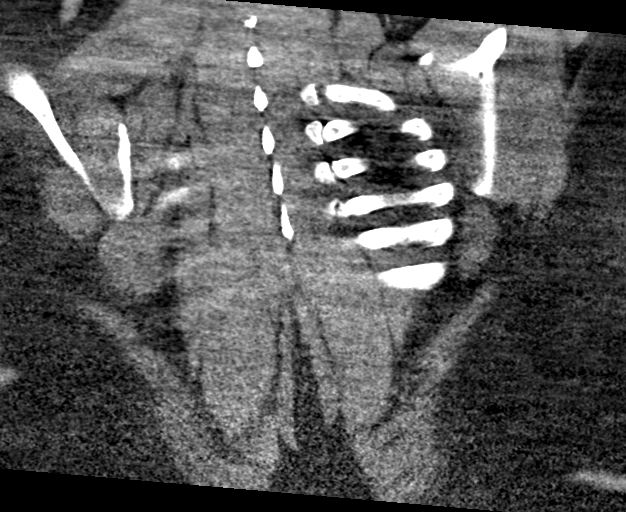

[18 of 46 positions shown; findings below may reference images not displayed]

FINDINGS: Cardiovascular: Chronic cardiomegaly. No pericardial effusion. No
acute aortic finding. Significantly suboptimal pulmonary artery
opacification due to transient interruption of contrast. This is
exacerbated by patient size and motion. A PE study in 8269 has
similar although less severe limitations-V/Q scan was ultimately
required at that time.

Mediastinum/Nodes: No evidence of adenopathy.  No pneumomediastinum

Lungs/Pleura: There is no edema, consolidation, effusion, or
pneumothorax.

Upper Abdomen: Negative

Musculoskeletal: No acute finding

Review of the MIP images confirms the above findings.
IMPRESSION: 1. Nondiagnostic PE study due to patient factors. Similar findings
were seen on a 8269 PE CT.
2. No acute finding.
3. Chronic cardiomegaly.

## 2018-11-27 ENCOUNTER — Ambulatory Visit: Payer: Medicaid Other | Admitting: Cardiology

## 2018-11-27 ENCOUNTER — Other Ambulatory Visit: Payer: Self-pay

## 2018-11-27 ENCOUNTER — Encounter: Payer: Self-pay | Admitting: Cardiology

## 2018-11-27 VITALS — BP 152/93 | HR 92 | Ht 68.0 in | Wt 386.0 lb

## 2018-11-27 DIAGNOSIS — I5042 Chronic combined systolic (congestive) and diastolic (congestive) heart failure: Secondary | ICD-10-CM

## 2018-11-27 DIAGNOSIS — Z794 Long term (current) use of insulin: Secondary | ICD-10-CM

## 2018-11-27 DIAGNOSIS — E1165 Type 2 diabetes mellitus with hyperglycemia: Secondary | ICD-10-CM

## 2018-11-27 DIAGNOSIS — Z6841 Body Mass Index (BMI) 40.0 and over, adult: Secondary | ICD-10-CM

## 2018-11-27 DIAGNOSIS — I10 Essential (primary) hypertension: Secondary | ICD-10-CM | POA: Diagnosis not present

## 2018-11-27 DIAGNOSIS — IMO0002 Reserved for concepts with insufficient information to code with codable children: Secondary | ICD-10-CM

## 2018-11-27 MED ORDER — SPIRONOLACTONE 50 MG PO TABS: 50.0000 mg | ORAL_TABLET | Freq: Every day | ORAL | 3 refills | Status: DC

## 2018-11-27 NOTE — Progress Notes (Signed)
Subjective:   [name], female    DOB: [birth year], 48 y.o.   MRN: [id number]  Bartholome Bill, MD:  Chief Complaint  Patient presents with  . Congestive Heart Failure  . Hypertension  . Follow-up    6wk   This visit type was conducted due to national recommendations for restrictions regarding the COVID-19 Pandemic (e.g. social distancing).  This format is felt to be most appropriate for this patient at this time.  All issues noted in this document were discussed and addressed.  No physical exam was performed (except for noted visual exam findings with Telehealth visits).  The patient has consented to conduct a Telehealth visit and understands insurance will be billed.   I discussed the limitations of evaluation and management by telemedicine and the availability of in person appointments. The patient expressed understanding and agreed to proceed.  Virtual Visit via Video Note is as below  I connected with [name], on 11/27/18 at 1445 by a video enabled telemedicine application and verified that I am speaking with the correct person using two identifiers.     I have discussed with her regarding the safety during COVID Pandemic and steps and precautions including social distancing with the patient.    HPI: [name]  is a 48 y.o. AA female  with Sjogren's syndrome, SLE, chronic systolic heart failure now with improved EF to 45-50% on echo in Nov 2019, morbid obesity, obesity hypoventilation syndrome, uncontrolled type 2 DM, hypertension, h/o CVA in 2014.  She is on guideline directed medical therapy for heart failure and tolerating well. She has had coronary angiogram in 2017 with normal coronary arteries. I had add Aldactone at her last office visit for hypertension and worsening dyspnea on exertion and leg edema. Kidney function remained stable. She reports shortness of breath and leg edema have significantly improved. Blood pressure has also improved and running in  the 970-263 systolic.   She continues to have uncontrolled diabetes and is being followed by Dr. Lissa [name]. Diabetes control has been complicated by steroid use for her sciatica. She reports previously noted diabetic thigh ulceration that was being followed by wound care has now completely healed. She does report a wound on her neck that is felt to be related to her lupus.   Continues to work to lose weight with using meal replacement shakes and does try to get some walking in by walking around her porch.    Past Medical History:  Diagnosis Date  . Acute renal failure (HCC)     Intractable nausea vomiting secondary to diabetic gastroparesis causing dehydration and acute renal failure /notes 04/01/2013  . Anginal pain ([place])   . Anxiety   . Bell's palsy 08/09/2010  . CAP (community acquired pneumonia) 10/2011   Archie Endo 11/08/2011  . Chest pain 01/13/2014  . CHF (congestive heart failure) ([place])   . Diabetic gastroparesis associated with type 2 diabetes mellitus ([place])    this is presumed diagnoses, not confirmed by any studies.   . Eczema   . Family history of malignant neoplasm of breast   . Family history of malignant neoplasm of ovary   . GERD (gastroesophageal reflux disease)   . High cholesterol   . Hypertension   . Liver mass 2014   biopsied 03/2013 at [facility], not malignant.  is to undergo radiologic ablation of the mass in sept/October 2014.   . Lupus ([place])   . Migraines    "maybe a couple times/yr" (  04/01/2013)  . Obesity   . Obstructive sleep apnea on CPAP 2011  . On home oxygen therapy    "2L 24/7" (02/28/2015)  . SJOGREN'S SYNDROME 08/09/2010  . SLE (systemic lupus erythematosus) ([place])    Archie Endo 12/01/2010  . Stroke [facility]) 2010; 10/2012   "left side is still weak from it, never fully regained full strength; no additions from stroke 10/2012"    Past Surgical History:  Procedure Laterality Date  . BREAST CYST EXCISION Left 08/2005   epidermoid  . CARDIAC CATHETERIZATION  02/07/12   . ECTOPIC PREGNANCY SURGERY  1999  . ECTOPIC PREGNANCY SURGERY  1999  . ESOPHAGOGASTRODUODENOSCOPY N/A 02/26/2013   Procedure: ESOPHAGOGASTRODUODENOSCOPY (EGD);  Surgeon: Irene Shipper, MD;  Location: [facility] ENDOSCOPY;  Service: Endoscopy;  Laterality: N/A;  . ESOPHAGOGASTRODUODENOSCOPY N/A 03/02/2015   Procedure: ESOPHAGOGASTRODUODENOSCOPY (EGD);  Surgeon: Milus Banister, MD;  Location: [facility];  Service: Endoscopy;  Laterality: N/A;  . HERNIA REPAIR    . LEFT AND RIGHT HEART CATHETERIZATION WITH CORONARY ANGIOGRAM N/A 02/07/2012   Procedure: LEFT AND RIGHT HEART CATHETERIZATION WITH CORONARY ANGIOGRAM;  Surgeon: Laverda Page, MD;  Location: [facility] CATH LAB;  Service: Cardiovascular;  Laterality: N/A;  . LIVER BIOPSY  03/2013   liver mass/medical hx noted above  . MUSCLE BIOPSY     for lupus/notes 12/01/2010  . UMBILICAL HERNIA REPAIR  1980's    Family History  Problem Relation Age of Onset  . Diabetes Mother   . Asthma Mother   . Urticaria Mother   . Hypertension Mother   . Breast cancer Maternal Aunt 13       currently 61  . Stomach cancer Maternal Uncle        dx 4s; deceased  . Prostate cancer Maternal Uncle        deceased 74s  . Cancer Maternal Aunt        unk. primary  . Cancer Maternal Aunt        unk. primary  . Ovarian cancer Maternal Aunt        deceased 20s  . Hypertension Other   . Stroke Other   . Arthritis Other   . Ovarian cancer Sister 66       maternal half-sister; RAD51C positive  . Cancer Paternal Aunt        unk. primary; deceased 30s  . Prostate cancer Paternal Uncle        deceased 46s  . Breast cancer Cousin        deceased 34s; daughter of mat uncle with stomach ca  . Breast cancer Maternal Aunt        deceased 42s  . Asthma Brother   . Asthma Brother   . Allergic rhinitis Neg Hx   . Angioedema Neg Hx     Social History   Socioeconomic History  . Marital status: Single    Spouse name: Not on file  . Number of children: 1  . Years of  education: Not on file  . Highest education level: Not on file  Occupational History    Employer: UNEMPLOYED    Comment: student  Social Needs  . Financial resource strain: Not on file  . Food insecurity:    Worry: Not on file    Inability: Not on file  . Transportation needs:    Medical: Not on file    Non-medical: Not on file  Tobacco Use  . Smoking status: Former Smoker    Years: 3.00  Types: Cigarettes    Last attempt to quit: 06/02/1991    Years since quitting: 27.5  . Smokeless tobacco: Never Used  Substance and Sexual Activity  . Alcohol use: No    Alcohol/week: 0.0 standard drinks  . Drug use: No  . Sexual activity: Yes    Partners: Male    Birth control/protection: None  Lifestyle  . Physical activity:    Days per week: Not on file    Minutes per session: Not on file  . Stress: Not on file  Relationships  . Social connections:    Talks on phone: Not on file    Gets together: Not on file    Attends religious service: Not on file    Active member of club or organization: Not on file    Attends meetings of clubs or organizations: Not on file    Relationship status: Not on file  . Intimate partner violence:    Fear of current or ex partner: Not on file    Emotionally abused: Not on file    Physically abused: Not on file    Forced sexual activity: Not on file  Other Topics Concern  . Not on file  Social History Narrative   Regular exercise-yes    Current Meds  Medication Sig  . albuterol (PROAIR HFA) 108 (90 BASE) MCG/ACT inhaler Inhale 2 puffs into the lungs every 6 (six) hours as needed for wheezing or shortness of breath.   Marland Kitchen albuterol (PROVENTIL) (2.5 MG/3ML) 0.083% nebulizer solution Take 2.5 mg by nebulization every 6 (six) hours as needed for wheezing.  Marland Kitchen ALPRAZolam (XANAX) 0.5 MG tablet Take 0.5 mg 2 (two) times daily as needed by mouth for anxiety or sleep.   Marland Kitchen amLODipine (NORVASC) 5 MG tablet Take 1 tablet (5 mg total) by mouth daily.  Marland Kitchen aspirin  EC 81 MG tablet Take 81 mg by mouth daily.  Marland Kitchen azaTHIOprine (IMURAN) 50 MG tablet Take 100 mg by mouth 2 (two) times daily.   . benzonatate (TESSALON) 100 MG capsule Take by mouth continuous as needed for cough.  Marland Kitchen BIDIL 20-37.5 MG tablet TAKE 2 TABLETS BY MOUTH THREE TIMES DAILY  . buPROPion (WELLBUTRIN) 100 MG tablet Take 150 mg by mouth every morning.   . carvedilol (COREG) 12.5 MG tablet Take 3 tablets (37.5 mg total) 2 (two) times daily with a meal by mouth. (Patient taking differently: Take 12.5 mg by mouth 2 (two) times daily with a meal. )  . cetirizine (ZYRTEC) 10 MG tablet Take 10 mg by mouth daily as needed for allergies.   Marland Kitchen clopidogrel (PLAVIX) 75 MG tablet Take 75 mg by mouth daily.  . clotrimazole-betamethasone (LOTRISONE) cream Apply 1 application daily as needed topically (itching/breakouts on feet).   Derrill Memo ON 12/17/2018] cyanocobalamin (,VITAMIN B-12,) 1000 MCG/ML injection Inject into the muscle. Every other month  . cycloSPORINE (RESTASIS) 0.05 % ophthalmic emulsion Place 1 drop into both eyes 3 (three) times daily.  . diclofenac sodium (VOLTAREN) 1 % GEL Apply 2-4 g 4 (four) times daily as needed topically (joint pain).  Marland Kitchen doxepin (SINEQUAN) 25 MG capsule Take 25 mg by mouth daily.   . DULoxetine (CYMBALTA) 60 MG capsule Take 60 mg by mouth at bedtime.  Marland Kitchen etonogestrel-ethinyl estradiol (NUVARING) 0.12-0.015 MG/24HR vaginal ring Place 1 each every 28 (twenty-eight) days vaginally. Insert vaginally and leave in place for 3 consecutive weeks, then remove for 1 week.  Marland Kitchen exenatide (BYETTA) 10 MCG/0.04ML SOPN injection Inject 0.04 mLs (10  mcg total) into the skin 2 (two) times daily with a meal. And pen needles 2/day  . famotidine (PEPCID) 20 MG tablet Take 20 mg by mouth 2 (two) times daily.  . fluticasone (FLONASE) 50 MCG/ACT nasal spray Place 2 sprays into both nostrils continuous as needed for allergies or rhinitis.  . hydroxychloroquine (PLAQUENIL) 200 MG tablet Take 200 mg by  mouth 2 (two) times daily.   . insulin NPH Human (HUMULIN N) 100 UNIT/ML injection Inject 2 mLs (200 Units total) into the skin every morning. And syringes 3/day  . megestrol (MEGACE) 40 MG tablet Take 40 mg by mouth daily.  . metFORMIN (GLUCOPHAGE-XR) 500 MG 24 hr tablet Take 1,000 mg by mouth 2 (two) times daily.  . mometasone-formoterol (DULERA) 100-5 MCG/ACT AERO Inhale 2 puffs into the lungs 2 (two) times daily.  . Olopatadine HCl (PAZEO) 0.7 % SOLN Place 1 drop daily as needed into both eyes (itching/irritation). Pazeo  . oxybutynin (DITROPAN-XL) 10 MG 24 hr tablet Take 15 mg by mouth daily.   . Polyvinyl Alcohol-Povidone (REFRESH OP) Place 1 drop 2 (two) times daily into both eyes.   . pravastatin (PRAVACHOL) 10 MG tablet Take 10 mg by mouth daily.  . predniSONE (DELTASONE) 1 MG tablet Take 3 mg by mouth daily.   . pregabalin (LYRICA) 75 MG capsule Take 75 mg 3 (three) times daily by mouth.   . psyllium (METAMUCIL MULTIHEALTH FIBER) 58.6 % powder Take 1 packet by mouth 2 (two) times daily.  . sacubitril-valsartan (ENTRESTO) 97-103 MG Take 1 tablet by mouth 2 (two) times daily.  Marland Kitchen spironolactone (ALDACTONE) 25 MG tablet Take 1 tablet (25 mg total) by mouth daily.  Marland Kitchen topiramate (TOPAMAX) 25 MG tablet Take 75 mg by mouth at bedtime.  . torsemide (DEMADEX) 20 MG tablet 40 mg in AM and 20 mg in PM (Patient taking differently: 20 mg 2 (two) times daily. )  . traMADol (ULTRAM) 50 MG tablet TAKE 1 TO 2 TABLETS BY MOUTH EVERY 6 HOURS AS NEEDED FOR PAIN**PA REQ** (Patient taking differently: Take 100 mg by mouth every 6 (six) hours as needed for moderate pain. )     Review of Systems  Constitution: Positive for weight loss. Negative for decreased appetite, malaise/fatigue and weight gain.  Eyes: Negative for visual disturbance.  Cardiovascular: Positive for dyspnea on exertion (improved). Negative for chest pain, claudication, leg swelling, orthopnea, palpitations and syncope.  Respiratory:  Negative for hemoptysis and wheezing.   Endocrine: Negative for cold intolerance and heat intolerance.  Hematologic/Lymphatic: Negative for bleeding problem. Does not bruise/bleed easily.  Skin: Negative for nail changes.  Musculoskeletal: Positive for joint pain (right knee). Negative for muscle weakness and myalgias.  Gastrointestinal: Negative for abdominal pain, change in bowel habit, nausea and vomiting.  Neurological: Negative for difficulty with concentration, dizziness, focal weakness and headaches.  Psychiatric/Behavioral: Negative for altered mental status and suicidal ideas.  All other systems reviewed and are negative.      Objective:     Blood pressure (!) 152/93, pulse 92, height _0  (1.727 m), weight (!) 386 lb (175.1 kg), last menstrual period 03/24/2017, SpO2 95 %.  Cardiac studies:  EKG [place] 03/26/2018: Sinus tachycardia with occasional Premature ventricular complexes Low voltage QRS. ST & T wave abnormality, consider inferior ischemia. Abnormal ECG.  Echocardiogram 12/19/2017: Left ventricle cavity is mildly dilated. Mild concentric hypertrophy of the left ventricle. Mild decrease in global wall motion. Doppler evidence of grade I (impaired) diastolic dysfunction, normal LAP. Calculated  EF 47%. Left atrial cavity is moderately dilated. Mild (Grade I) mitral regurgitation. Mild prolapse of the mitral valve leaflets. Mild tricuspid regurgitation. No evidence of pulmonary hypertension. No significant change compared to previous study dated 03/09/2017.  Vascular Ultrasound Lower Extremity Venous 03/26/2018: Right: There is no evidence of deep vein thrombosis in the lower extremity. No cystic structure found in the popliteal fossa. Left: No evidence of common femoral vein obstruction.  Carotid duplex 03/06/2014: The vertebral arteries appear patent with antegrade flow. Findings consistent with 1-39 percent stenosis involving theright internal carotid artery and the  left internal carotidartery.  Coronary angiogram 02/07/2012: Normal coronary arteries and LVEF. Left dominant circulation.   Recent Labs:   Lab Results  Component Value Date   HGBA1C 8.8 (A) 08/20/2018    CBC Latest Ref Rng & Units 03/26/2018  WBC 4.0 - 10.5 K/uL 8.7  Hemoglobin 12.0 - 15.0 g/dL 11.8(L)  Hematocrit 36.0 - 46.0 % 38.0  Platelets 150 - 400 K/uL 333   CMP     Component Value Date/Time   NA 140 11/01/2018 1525   K 4.2 11/01/2018 1525   CL 103 11/01/2018 1525   CO2 22 11/01/2018 1525   GLUCOSE 273 (H) 11/01/2018 1525   GLUCOSE 145 (H) 03/26/2018 1119   BUN 8 11/01/2018 1525   CREATININE 0.90 11/01/2018 1525   CALCIUM 9.3 11/01/2018 1525   PROT 6.6 09/20/2017 1521   ALBUMIN 2.6 (L) 09/20/2017 1521   AST 27 09/20/2017 1521   ALT 8 (L) 09/20/2017 1521   ALKPHOS 90 09/20/2017 1521   BILITOT 0.4 09/20/2017 1521   GFRNONAA 76 11/01/2018 1525   GFRAA 87 11/01/2018 1525     BNP    Component Value Date/Time   BNP 139.3 (H) 03/26/2018 1119    01/17/2017: Total cholesterol 149, triglycerides 129, HDL 66, LDL 57.   *Physical exam not performed as this is a telephone visit*       Assessment & Recommendations:  Chronic combined systolic and diastolic heart failure ([place])  Essential hypertension  Uncontrolled type 2 diabetes mellitus, with long-term current use of insulin ([place])  Morbid obesity with BMI of 60.0-69.9, adult ([place])   Plan: With addition of Aldactone, blood pressure has significantly improved as well as her shortness of breath and leg edema.  Symptomatically, she does not appear to be in acute heart failure exacerbation.  Her blood pressure continues to to be slightly elevated, I will further increase her Aldactone to 50 mg daily.  Kidney function and potassium level remained stable.  She is to see her endocrinologist next month and will ask on labs performed at that time that her kidney function be checked.  I would like to avoid unnecessary  exposure hence I have not order labs today.  If labs cannot be performed at her endocrinology appointment, she will notify me and we will place order at that time.  She has been making diet changes to better control her diabetes and also lose weight.  She is lost approximately 15 to 20 pounds since last seen by me, congratulated her in provided positive reinforcement.  Urged her to continue to make diet changes.  Encouraged her to try to incorporate more daily exercise.  We will schedule for follow-up in 2 to 3 months to follow-up on CHF and hypertension.  Jeri Lager, MSN, APRN, FNP-C [facility], [place] Office: [phone] Fax: [phone]

## 2018-11-30 ENCOUNTER — Ambulatory Visit: Payer: Medicaid Other | Admitting: Cardiology

## 2018-12-05 ENCOUNTER — Other Ambulatory Visit: Payer: Self-pay | Admitting: Cardiology

## 2018-12-20 ENCOUNTER — Ambulatory Visit: Payer: Medicaid Other | Admitting: Endocrinology

## 2018-12-28 ENCOUNTER — Other Ambulatory Visit: Payer: Self-pay

## 2018-12-28 ENCOUNTER — Ambulatory Visit: Payer: Medicaid Other | Admitting: Endocrinology

## 2018-12-28 ENCOUNTER — Telehealth: Payer: Self-pay | Admitting: Endocrinology

## 2018-12-28 NOTE — Telephone Encounter (Signed)
Please reschedule pt to next available

## 2018-12-28 NOTE — Telephone Encounter (Signed)
Rescheduled appt to 6/2. Requested to be seen in person

## 2019-01-01 ENCOUNTER — Other Ambulatory Visit: Payer: Self-pay

## 2019-01-01 ENCOUNTER — Encounter: Payer: Self-pay | Admitting: Endocrinology

## 2019-01-01 ENCOUNTER — Ambulatory Visit (INDEPENDENT_AMBULATORY_CARE_PROVIDER_SITE_OTHER): Payer: Medicaid Other | Admitting: Endocrinology

## 2019-01-01 VITALS — BP 118/84 | HR 102 | Temp 98.3°F | Wt >= 6400 oz

## 2019-01-01 DIAGNOSIS — E1143 Type 2 diabetes mellitus with diabetic autonomic (poly)neuropathy: Secondary | ICD-10-CM | POA: Diagnosis not present

## 2019-01-01 DIAGNOSIS — M5430 Sciatica, unspecified side: Secondary | ICD-10-CM

## 2019-01-01 DIAGNOSIS — Z794 Long term (current) use of insulin: Secondary | ICD-10-CM | POA: Diagnosis not present

## 2019-01-01 DIAGNOSIS — L309 Dermatitis, unspecified: Secondary | ICD-10-CM | POA: Diagnosis not present

## 2019-01-01 LAB — POCT GLYCOSYLATED HEMOGLOBIN (HGB A1C): Hemoglobin A1C: 11.7 % — AB (ref 4.0–5.6)

## 2019-01-01 MED ORDER — INSULIN NPH (HUMAN) (ISOPHANE) 100 UNIT/ML ~~LOC~~ SUSP: 220.0000 [IU] | SUBCUTANEOUS | 11 refills | Status: DC

## 2019-01-01 MED ORDER — CLOTRIMAZOLE-BETAMETHASONE 1-0.05 % EX CREA: 1.0000 "application " | TOPICAL_CREAM | Freq: Two times a day (BID) | CUTANEOUS | 3 refills | Status: DC

## 2019-01-01 NOTE — Patient Instructions (Addendum)
check your blood sugar 4 times a day: before the 3 meals, and at bedtime.  also check if you have symptoms of your blood sugar being too high or too low.  please keep a record of the readings and bring it to your next appointment here (or you can bring the meter itself).  You can write it on any piece of paper.  please call us sooner if your blood sugar goes below 70, or if you have a lot of readings over 200, especially if you have to increase the prednisone again.   Please continue the same metformin and Byetta, and: increase the NPH insulin to 220 units each morning.    On this type of insulin schedule, you should eat meals on a regular schedule (especially lunch).  If a meal is missed or significantly delayed, your blood sugar could go low.   I have sent a prescription to your pharmacy, for the skin cracking.  Please come back for a follow-up appointment in 2 months.

## 2019-01-01 NOTE — Progress Notes (Signed)
Subjective:    Patient ID: [name], female    DOB: 23-Jun-1971, 48 y.o.   MRN: [id number]  HPI Pt returns for f/u of diabetes mellitus: DM type: Insulin-requiring type 2 Dx'ed: 7619 Complications: polyneuropathy and gastroparesis.  Therapy: insulin (since 2014), Byetta, and metformin.   GDM: never.  DKA: never.  Severe hypoglycemia: never.  Pancreatitis: never.  Other: she is too ill to undergo weight-loss surgery; she takes qd insulin, after poor results with multiple daily injections; she takes NPH, due to pattern of cbg's.   Interval history: she is once again tapering prednisone (it was increased due to sciatica--she is back down to 3 mg qd x 1 week).  She says she never misses the insulin.  She takes 200 units qam.  no cbg record, but states cbg's vary from 132-178.   Past Medical History:  Diagnosis Date  . Acute renal failure (HCC)     Intractable nausea vomiting secondary to diabetic gastroparesis causing dehydration and acute renal failure /notes 04/01/2013  . Anginal pain ([place])   . Anxiety   . Bell's palsy 08/09/2010  . CAP (community acquired pneumonia) 10/2011   Archie Endo 11/08/2011  . Chest pain 01/13/2014  . CHF (congestive heart failure) ([place])   . Diabetic gastroparesis associated with type 2 diabetes mellitus ([place])    this is presumed diagnoses, not confirmed by any studies.   . Eczema   . Family history of malignant neoplasm of breast   . Family history of malignant neoplasm of ovary   . GERD (gastroesophageal reflux disease)   . High cholesterol   . Hypertension   . Liver mass 2014   biopsied 03/2013 at [facility], not malignant.  is to undergo radiologic ablation of the mass in sept/October 2014.   . Lupus ([place])   . Migraines    "maybe a couple times/yr" (04/01/2013)  . Obesity   . Obstructive sleep apnea on CPAP 2011  . On home oxygen therapy    "2L 24/7" (02/28/2015)  . SJOGREN'S SYNDROME 08/09/2010  . SLE (systemic lupus erythematosus) ([place])    Archie Endo  12/01/2010  . Stroke [facility]) 2010; 10/2012   "left side is still weak from it, never fully regained full strength; no additions from stroke 10/2012"    Past Surgical History:  Procedure Laterality Date  . BREAST CYST EXCISION Left 08/2005   epidermoid  . CARDIAC CATHETERIZATION  02/07/12  . ECTOPIC PREGNANCY SURGERY  1999  . ECTOPIC PREGNANCY SURGERY  1999  . ESOPHAGOGASTRODUODENOSCOPY N/A 02/26/2013   Procedure: ESOPHAGOGASTRODUODENOSCOPY (EGD);  Surgeon: Irene Shipper, MD;  Location: [facility] ENDOSCOPY;  Service: Endoscopy;  Laterality: N/A;  . ESOPHAGOGASTRODUODENOSCOPY N/A 03/02/2015   Procedure: ESOPHAGOGASTRODUODENOSCOPY (EGD);  Surgeon: Milus Banister, MD;  Location: [place];  Service: Endoscopy;  Laterality: N/A;  . HERNIA REPAIR    . LEFT AND RIGHT HEART CATHETERIZATION WITH CORONARY ANGIOGRAM N/A 02/07/2012   Procedure: LEFT AND RIGHT HEART CATHETERIZATION WITH CORONARY ANGIOGRAM;  Surgeon: Laverda Page, MD;  Location: [facility] CATH LAB;  Service: Cardiovascular;  Laterality: N/A;  . LIVER BIOPSY  03/2013   liver mass/medical hx noted above  . MUSCLE BIOPSY     for lupus/notes 12/01/2010  . UMBILICAL HERNIA REPAIR  1980's    Social History   Socioeconomic History  . Marital status: Single    Spouse name: Not on file  . Number of children: 1  . Years of education: Not on file  . Highest education level: Not  on file  Occupational History    Employer: UNEMPLOYED    Comment: student  Social Needs  . Financial resource strain: Not on file  . Food insecurity:    Worry: Not on file    Inability: Not on file  . Transportation needs:    Medical: Not on file    Non-medical: Not on file  Tobacco Use  . Smoking status: Former Smoker    Years: 3.00    Types: Cigarettes    Last attempt to quit: 06/02/1991    Years since quitting: 27.6  . Smokeless tobacco: Never Used  Substance and Sexual Activity  . Alcohol use: No    Alcohol/week: 0.0 standard drinks  . Drug use: No  . Sexual  activity: Yes    Partners: Male    Birth control/protection: None  Lifestyle  . Physical activity:    Days per week: Not on file    Minutes per session: Not on file  . Stress: Not on file  Relationships  . Social connections:    Talks on phone: Not on file    Gets together: Not on file    Attends religious service: Not on file    Active member of club or organization: Not on file    Attends meetings of clubs or organizations: Not on file    Relationship status: Not on file  . Intimate partner violence:    Fear of current or ex partner: Not on file    Emotionally abused: Not on file    Physically abused: Not on file    Forced sexual activity: Not on file  Other Topics Concern  . Not on file  Social History Narrative   Regular exercise-yes    Current Outpatient Medications on File Prior to Visit  Medication Sig Dispense Refill  . ACCU-CHEK AVIVA PLUS test strip USE TO CHECK BLOOD SUGAR THREE TIMES A DAY 300 each 0  . Accu-Chek Softclix Lancets lancets Use to monitor glucose levels 4 times per day; E11.9 200 each 2  . albuterol (PROAIR HFA) 108 (90 BASE) MCG/ACT inhaler Inhale 2 puffs into the lungs every 6 (six) hours as needed for wheezing or shortness of breath.     Marland Kitchen albuterol (PROVENTIL) (2.5 MG/3ML) 0.083% nebulizer solution Take 2.5 mg by nebulization every 6 (six) hours as needed for wheezing.    Marland Kitchen ALPRAZolam (XANAX) 0.5 MG tablet Take 0.5 mg 2 (two) times daily as needed by mouth for anxiety or sleep.     Marland Kitchen amLODipine (NORVASC) 5 MG tablet Take 1 tablet (5 mg total) by mouth daily. 90 tablet 3  . aspirin EC 81 MG tablet Take 81 mg by mouth daily.    Marland Kitchen azaTHIOprine (IMURAN) 50 MG tablet Take 100 mg by mouth 2 (two) times daily.     . B-D UF III MINI PEN NEEDLES 31G X 5 MM MISC USE AS DIRECTED TWICE A DAY 100 each 2  . benzonatate (TESSALON) 100 MG capsule Take by mouth continuous as needed for cough.    Marland Kitchen BIDIL 20-37.5 MG tablet TAKE 2 TABLETS BY MOUTH THREE TIMES DAILY  180 tablet 3  . Blood Glucose Monitoring Suppl (ACCU-CHEK AVIVA) device Use as instructed 1 each 0  . buPROPion (WELLBUTRIN) 100 MG tablet Take 150 mg by mouth every morning.     . carvedilol (COREG) 12.5 MG tablet Take 3 tablets (37.5 mg total) 2 (two) times daily with a meal by mouth. (Patient taking differently: Take 12.5 mg by mouth  2 (two) times daily with a meal. ) 180 tablet 0  . carvedilol (COREG) 25 MG tablet TAKE 1 AND 1/2 TABLETS BY MOUTH TWICE DAILY 180 tablet 3  . cetirizine (ZYRTEC) 10 MG tablet Take 10 mg by mouth daily as needed for allergies.     Marland Kitchen clopidogrel (PLAVIX) 75 MG tablet Take 75 mg by mouth daily.    . cyanocobalamin (,VITAMIN B-12,) 1000 MCG/ML injection Inject into the muscle. Every other month    . cycloSPORINE (RESTASIS) 0.05 % ophthalmic emulsion Place 1 drop into both eyes 3 (three) times daily.    . diclofenac sodium (VOLTAREN) 1 % GEL Apply 2-4 g 4 (four) times daily as needed topically (joint pain).    Marland Kitchen doxepin (SINEQUAN) 25 MG capsule Take 25 mg by mouth daily.     . DULoxetine (CYMBALTA) 60 MG capsule Take 60 mg by mouth at bedtime.    Marland Kitchen etonogestrel-ethinyl estradiol (NUVARING) 0.12-0.015 MG/24HR vaginal ring Place 1 each every 28 (twenty-eight) days vaginally. Insert vaginally and leave in place for 3 consecutive weeks, then remove for 1 week.    Marland Kitchen exenatide (BYETTA) 10 MCG/0.04ML SOPN injection Inject 0.04 mLs (10 mcg total) into the skin 2 (two) times daily with a meal. And pen needles 2/day 2.4 mL 11  . famotidine (PEPCID) 20 MG tablet Take 20 mg by mouth 2 (two) times daily.    . fluticasone (FLONASE) 50 MCG/ACT nasal spray Place 2 sprays into both nostrils continuous as needed for allergies or rhinitis.    . hydroxychloroquine (PLAQUENIL) 200 MG tablet Take 200 mg by mouth 2 (two) times daily.     . Insulin Pen Needle (EASY TOUCH PEN NEEDLES) 32G X 4 MM MISC USE TO INJECT INSULIN TWICE DAILY 100 each 10  . Insulin Syringe-Needle U-100 (EASY TOUCH  INSULIN SYRINGE) 31G X 5/16" 1 ML MISC USE AS DIRECTED THREE TIMES A DAY 100 each 10  . megestrol (MEGACE) 40 MG tablet Take 40 mg by mouth daily.  3  . metFORMIN (GLUCOPHAGE-XR) 500 MG 24 hr tablet Take 1,000 mg by mouth 2 (two) times daily.    . mometasone-formoterol (DULERA) 100-5 MCG/ACT AERO Inhale 2 puffs into the lungs 2 (two) times daily.    . Olopatadine HCl (PAZEO) 0.7 % SOLN Place 1 drop daily as needed into both eyes (itching/irritation). Pazeo    . oxybutynin (DITROPAN-XL) 10 MG 24 hr tablet Take 15 mg by mouth daily.     . Polyvinyl Alcohol-Povidone (REFRESH OP) Place 1 drop 2 (two) times daily into both eyes.     . pravastatin (PRAVACHOL) 10 MG tablet Take 10 mg by mouth daily.    . predniSONE (DELTASONE) 1 MG tablet Take 3 mg by mouth daily.     . pregabalin (LYRICA) 75 MG capsule Take 75 mg 3 (three) times daily by mouth.     . psyllium (METAMUCIL MULTIHEALTH FIBER) 58.6 % powder Take 1 packet by mouth 2 (two) times daily.    . sacubitril-valsartan (ENTRESTO) 97-103 MG Take 1 tablet by mouth 2 (two) times daily.    Marland Kitchen spironolactone (ALDACTONE) 50 MG tablet Take 1 tablet (50 mg total) by mouth daily. 30 tablet 3  . topiramate (TOPAMAX) 25 MG tablet Take 75 mg by mouth at bedtime.    . torsemide (DEMADEX) 20 MG tablet 40 mg in AM and 20 mg in PM (Patient taking differently: 20 mg 2 (two) times daily. ) 30 tablet 0  . traMADol (ULTRAM) 50 MG tablet  TAKE 1 TO 2 TABLETS BY MOUTH EVERY 6 HOURS AS NEEDED FOR PAIN**PA REQ** (Patient taking differently: Take 100 mg by mouth every 6 (six) hours as needed for moderate pain. ) 60 tablet 0   No current facility-administered medications on file prior to visit.     Allergies  Allergen Reactions  . Metoclopramide Other (See Comments)    Developed restless leg, akathisia type limb movements.   . Codeine Itching, Rash and Other (See Comments)  . Penicillins Itching    Has patient had a PCN reaction causing immediate rash,  facial/tongue/throat swelling, SOB or lightheadedness with hypotension: No Has patient had a PCN reaction causing severe rash involving mucus membranes or skin necrosis: No Has patient had a PCN reaction that required hospitalization: No Has patient had a PCN reaction occurring within the last 10 years:yes If all of the above answers are "NO", then may proceed with Cephalosporin use.  Marland Kitchen Hydrocodone Rash  . Percocet [Oxycodone-Acetaminophen] Hives and Rash    Tolerates dilaudid    Family History  Problem Relation Age of Onset  . Diabetes Mother   . Asthma Mother   . Urticaria Mother   . Hypertension Mother   . Breast cancer Maternal Aunt 51       currently 49  . Stomach cancer Maternal Uncle        dx 83s; deceased  . Prostate cancer Maternal Uncle        deceased 67s  . Cancer Maternal Aunt        unk. primary  . Cancer Maternal Aunt        unk. primary  . Ovarian cancer Maternal Aunt        deceased 88s  . Hypertension Other   . Stroke Other   . Arthritis Other   . Ovarian cancer Sister 59       maternal half-sister; RAD51C positive  . Cancer Paternal Aunt        unk. primary; deceased 31s  . Prostate cancer Paternal Uncle        deceased 22s  . Breast cancer Cousin        deceased 85s; daughter of mat uncle with stomach ca  . Breast cancer Maternal Aunt        deceased 64s  . Asthma Brother   . Asthma Brother   . Allergic rhinitis Neg Hx   . Angioedema Neg Hx     BP 118/84 (BP Location: Left Wrist, Patient Position: Sitting, Cuff Size: Large)   Pulse (!) 102   Temp 98.3 F (36.8 C) (Oral)   Wt (!) 429 lb (194.6 kg)   LMP 03/24/2017   SpO2 98%   BMI 65.23 kg/m    Review of Systems She denies hypoglycemia.      Objective:   Physical Exam VITAL SIGNS:  See vs page GENERAL: no distress Pulses: dorsalis pedis intact bilat.   MSK: no deformity of the feet CV: trace bilat leg edema Skin:  no ulcer on the feet, but the skin is cracking.  normal color and  temp on the feet.   Neuro: sensation is intact to touch on the feet.         Assessment & Plan:  Insulin-requiring type 2 DM, with PN: worse Sciatica: steroid rx is affecting glycemic control Dermatitis: heat rash vs fungal  Patient Instructions  check your blood sugar 4 times a day: before the 3 meals, and at bedtime.  also check if you have symptoms of  your blood sugar being too high or too low.  please keep a record of the readings and bring it to your next appointment here (or you can bring the meter itself).  You can write it on any piece of paper.  please call us sooner if your blood sugar goes below 70, or if you have a lot of readings over 200, especially if you have to increase the prednisone again.   Please continue the same metformin and Byetta, and: increase the NPH insulin to 220 units each morning.    On this type of insulin schedule, you should eat meals on a regular schedule (especially lunch).  If a meal is missed or significantly delayed, your blood sugar could go low.   I have sent a prescription to your pharmacy, for the skin cracking.  Please come back for a follow-up appointment in 2 months.

## 2019-01-02 ENCOUNTER — Telehealth: Payer: Self-pay

## 2019-01-02 NOTE — Telephone Encounter (Signed)
Received notification from [facility] re: need to complete PA for Humulin [place] Medicaid @ [phone]. Spoke with Abigail Butts, Ref# [id number]. States Humulin N is a preferred medication and does not require PA. Also states she did not see claims that had been submitted from [facility] to indicate why they would be having issues with processing Rx. Asked if their issue could be related to the dosage and in need of qty exception. Abigail Butts states an override would then be required from the pharmacy but the medication is approved and qty has been approved as well. Abigail Butts advised to have [facility] call them if they continue to encounter issues with processing Rx.   Called [facility] and spoke with Crystal. Informed of my conversation with Abigail Butts as documented above. Provided Ref #, contact # and name. Advised she call Medicaid directly if they continued to encounter issues. No further action required from our office at this time. Verbalized acceptance and understanding.

## 2019-01-14 ENCOUNTER — Telehealth: Payer: Self-pay

## 2019-01-14 NOTE — Telephone Encounter (Signed)
Patient would like a Rx refill on Tramadol sent to [facility] on [address].  Cb# [phone].  Please advise.  Thank you.

## 2019-01-14 NOTE — Telephone Encounter (Signed)
Please advise She hasn't been seen in 9 months

## 2019-01-14 NOTE — Telephone Encounter (Signed)
Needs appointment

## 2019-01-15 NOTE — Telephone Encounter (Signed)
.  Patient aware and will call back to schedule appt. 

## 2019-01-21 ENCOUNTER — Ambulatory Visit (INDEPENDENT_AMBULATORY_CARE_PROVIDER_SITE_OTHER): Payer: Medicaid Other

## 2019-01-21 ENCOUNTER — Ambulatory Visit (INDEPENDENT_AMBULATORY_CARE_PROVIDER_SITE_OTHER): Payer: Medicaid Other | Admitting: Physician Assistant

## 2019-01-21 ENCOUNTER — Encounter: Payer: Self-pay | Admitting: Physician Assistant

## 2019-01-21 ENCOUNTER — Other Ambulatory Visit: Payer: Self-pay

## 2019-01-21 VITALS — Ht 68.0 in | Wt >= 6400 oz

## 2019-01-21 DIAGNOSIS — M25562 Pain in left knee: Secondary | ICD-10-CM

## 2019-01-21 MED ORDER — LIDOCAINE HCL 1 % IJ SOLN
3.0000 mL | INTRAMUSCULAR | Status: AC | PRN
  Administered 2019-01-21: 3 mL

## 2019-01-21 MED ORDER — METHYLPREDNISOLONE ACETATE 40 MG/ML IJ SUSP
40.0000 mg | INTRAMUSCULAR | Status: AC | PRN
  Administered 2019-01-21: 40 mg via INTRA_ARTICULAR

## 2019-01-21 MED ORDER — TRAMADOL HCL 50 MG PO TABS: ORAL_TABLET | ORAL | 0 refills | Status: DC

## 2019-01-21 NOTE — Progress Notes (Signed)
Office Visit Note   Patient: [name]           Date of Birth: 15-Nov-1970           MRN: [id number] Visit Date: 01/21/2019              Requested by: [name] Bill, MD [place],  [place] [id number] PCP: [name] Bill, MD   Assessment & Plan: Visit Diagnoses:  1. Acute pain of left knee     Plan: We will see her back on an as-needed basis in regards to her left knee.  She understands she needs to wait at least 3 months between injections.  In regards to her wrist is been bothering for the past 2 weeks she states she is had no injury and therefore will just let her watch this.  It definitely could be related to her lupus which she feels is.  Follow-Up Instructions: Return if symptoms worsen or fail to improve.   Orders:  Orders Placed This Encounter  Procedures  . Large Joint Inj: L knee  . XR Knee 1-2 Views Left   Meds ordered this encounter  Medications  . traMADol (ULTRAM) 50 MG tablet    Sig: TAKE 1 TO 2 TABLETS BY MOUTH EVERY 6 HOURS AS NEEDED FOR PAIN**PA REQ**    Dispense:  40 tablet    Refill:  0    Not to exceed 5 additional fills before 02/13/2018.      Procedures: Large Joint Inj: L knee on 01/21/2019 9:49 AM Indications: pain Details: 22 G 1.5 in needle, anterolateral approach  Arthrogram: No  Medications: 3 mL lidocaine 1 %; 40 mg methylPREDNISolone acetate 40 MG/ML Outcome: tolerated well, no immediate complications Procedure, treatment alternatives, risks and benefits explained, specific risks discussed. Consent was given by the patient. Immediately prior to procedure a time out was called to verify the correct patient, procedure, equipment, support staff and site/side marked as required. Patient was prepped and draped in the usual sterile fashion.       Clinical Data: No additional findings.   Subjective: Chief Complaint  Patient presents with  . Lower Back - Pain  . Right Knee - Pain  . Right  Wrist - Pain    HPI [name] comes in today due to left knee pain.  She is had no new injury to the knee.  Having pain mainly medial aspect.  She states that her back pain is actually better after going to physical therapy She does a home exercise program this helps.  She notes she is having some wrist pain is been ongoing for the past 2 weeks feels this is related to her lupus.  She denies any injury to the wrist.  Review of Systems No fevers or chills.  Objective: Vital Signs: Ht _0  (1.727 m)   Wt (!) 429 lb (194.6 kg)   LMP 03/24/2017   BMI 65.23 kg/m   Physical Exam General: No acute distress.  Mood and affect appropriate. Ortho Exam Right wrist good range of motion without significant pain.  She has pain over the dorsal mid wrist.  There is no significant edema.  No ecchymosis erythema.  She has no tenderness over the snuffbox area. Left knee tenderness along medial joint line.  No instability valgus varus stressing.  Slightly hyper flexes both knees.  Good range of motion of both knees without significant crepitus. Specialty Comments:  No specialty comments available.  Imaging: Xr Knee 1-2 Views Left  Result Date: 01/21/2019 Left knee AP and lateral view shows moderate narrowing of the medial joint line.  Lateral joint line with articular osteophytes but overall joint lines well-maintained.  Mild to moderate arthritic changes patellofemoral joint.  No acute fractures.  Knee is well located.    PMFS History: Patient Active Problem List   Diagnosis Date Noted  . Multifocal pneumonia 09/23/2017  . Acute respiratory failure ([place]) 09/21/2017  . CHF exacerbation ([place]) 09/20/2017  . Costochondritis 06/06/2017  . Chest pain 06/04/2017  . Diabetes ([place]) 09/19/2015  . Radiculopathy of cervical spine   . Severe headache 03/05/2014  . Family history of malignant neoplasm of breast   . Family history of malignant neoplasm of ovary   . Intractable nausea and vomiting  11/11/2013  . Septate uterus 09/08/2013  . Diabetic gastroparesis ([place]) 04/28/2013  . Constipation due to pain medication 04/21/2013  . Odynophagia 04/04/2013  . Liver mass 02/28/2013  . Reflux esophagitis 02/26/2013  . Morbid obesity ([place]) 10/10/2012  . Chronic diastolic CHF (congestive heart failure) ([place]) 10/09/2012  . Chronic respiratory failure with hypoxia ([place]) 10/09/2012  . Clinical polymyositis syndrome ([place]) 09/06/2012  . Mononeuritis of lower limb 01/12/2012  . HTN (hypertension) 11/08/2011  . Systolic CHF, acute on chronic ([place]) 11/08/2011  . HLD (hyperlipidemia) 07/19/2011  . Bell's palsy 08/09/2010  . SLE 08/09/2010  . [place] SYNDROME 08/09/2010   Past Medical History:  Diagnosis Date  . Acute renal failure (HCC)     Intractable nausea vomiting secondary to diabetic gastroparesis causing dehydration and acute renal failure /notes 04/01/2013  . Anginal pain ([place])   . Anxiety   . Bell's palsy 08/09/2010  . CAP (community acquired pneumonia) 10/2011   [name] 11/08/2011  . Chest pain 01/13/2014  . CHF (congestive heart failure) ([place])   . Diabetic gastroparesis associated with type 2 diabetes mellitus ([place])    this is presumed diagnoses, not confirmed by any studies.   . Eczema   . Family history of malignant neoplasm of breast   . Family history of malignant neoplasm of ovary   . GERD (gastroesophageal reflux disease)   . High cholesterol   . Hypertension   . Liver mass 2014   biopsied 03/2013 [facility], not malignant.  is to undergo radiologic ablation of the mass in sept/October 2014.   . Lupus ([place])   . Migraines    "maybe a couple times/yr" (04/01/2013)  . Obesity   . Obstructive sleep apnea on CPAP 2011  . On home oxygen therapy    "2L 24/7" (02/28/2015)  . SJOGREN'S SYNDROME 08/09/2010  . SLE (systemic lupus erythematosus) ([place])    [name] 12/01/2010  . Stroke [facility]) 2010; 10/2012   "left side is still weak from it, never fully regained full strength; no  additions from stroke 10/2012"    Family History  Problem Relation Age of Onset  . Diabetes Mother   . Asthma Mother   . Urticaria Mother   . Hypertension Mother   . Breast cancer Maternal Aunt 73       currently 45  . Stomach cancer Maternal Uncle        dx 70s; deceased  . Prostate cancer Maternal Uncle        deceased 69s  . Cancer Maternal Aunt        unk. primary  . Cancer Maternal Aunt        unk. primary  . Ovarian cancer Maternal  Aunt        deceased 35s  . Hypertension Other   . Stroke Other   . Arthritis Other   . Ovarian cancer Sister 31       maternal half-sister; RAD51C positive  . Cancer Paternal Aunt        unk. primary; deceased 37s  . Prostate cancer Paternal Uncle        deceased 30s  . Breast cancer Cousin        deceased 20s; daughter of mat uncle with stomach ca  . Breast cancer Maternal Aunt        deceased 75s  . Asthma Brother   . Asthma Brother   . Allergic rhinitis Neg Hx   . Angioedema Neg Hx     Past Surgical History:  Procedure Laterality Date  . BREAST CYST EXCISION Left 08/2005   epidermoid  . CARDIAC CATHETERIZATION  02/07/12  . ECTOPIC PREGNANCY SURGERY  1999  . ECTOPIC PREGNANCY SURGERY  1999  . ESOPHAGOGASTRODUODENOSCOPY N/A 02/26/2013   Procedure: ESOPHAGOGASTRODUODENOSCOPY (EGD);  Surgeon: Irene Shipper, MD;  Location: [facility] ENDOSCOPY;  Service: Endoscopy;  Laterality: N/A;  . ESOPHAGOGASTRODUODENOSCOPY N/A 03/02/2015   Procedure: ESOPHAGOGASTRODUODENOSCOPY (EGD);  Surgeon: Milus Banister, MD;  Location: [place];  Service: Endoscopy;  Laterality: N/A;  . HERNIA REPAIR    . LEFT AND RIGHT HEART CATHETERIZATION WITH CORONARY ANGIOGRAM N/A 02/07/2012   Procedure: LEFT AND RIGHT HEART CATHETERIZATION WITH CORONARY ANGIOGRAM;  Surgeon: Laverda Page, MD;  Location[facility] CATH LAB;  Service: Cardiovascular;  Laterality: N/A;  . LIVER BIOPSY  03/2013   liver mass/medical hx noted above  . MUSCLE BIOPSY     for lupus/notes 12/01/2010  .  UMBILICAL HERNIA REPAIR  1980's   Social History   Occupational History    Employer: UNEMPLOYED    Comment: student  Tobacco Use  . Smoking status: Former Smoker    Years: 3.00    Types: Cigarettes    Quit date: 06/02/1991    Years since quitting: 27.6  . Smokeless tobacco: Never Used  Substance and Sexual Activity  . Alcohol use: No    Alcohol/week: 0.0 standard drinks  . Drug use: No  . Sexual activity: Yes    Partners: Male    Birth control/protection: None

## 2019-02-06 ENCOUNTER — Other Ambulatory Visit: Payer: Self-pay

## 2019-02-06 NOTE — Telephone Encounter (Signed)
LVM for pt to contact office to update insurance information in order to complete prior auth for entresto current insurance number [id number] L for medicaid not valid

## 2019-02-07 ENCOUNTER — Other Ambulatory Visit: Payer: Self-pay

## 2019-02-07 DIAGNOSIS — I5023 Acute on chronic systolic (congestive) heart failure: Secondary | ICD-10-CM

## 2019-02-07 MED ORDER — ENTRESTO 97-103 MG PO TABS: 1.0000 | ORAL_TABLET | Freq: Two times a day (BID) | ORAL | 2 refills | Status: DC

## 2019-03-05 ENCOUNTER — Other Ambulatory Visit: Payer: Self-pay

## 2019-03-05 ENCOUNTER — Ambulatory Visit: Payer: Medicaid Other | Admitting: Cardiology

## 2019-03-05 ENCOUNTER — Telehealth: Payer: Medicaid Other | Admitting: Cardiology

## 2019-03-05 ENCOUNTER — Encounter: Payer: Self-pay | Admitting: Cardiology

## 2019-03-05 VITALS — BP 132/84 | HR 92 | Ht 68.0 in | Wt 386.0 lb

## 2019-03-05 DIAGNOSIS — IMO0002 Reserved for concepts with insufficient information to code with codable children: Secondary | ICD-10-CM

## 2019-03-05 DIAGNOSIS — E1165 Type 2 diabetes mellitus with hyperglycemia: Secondary | ICD-10-CM

## 2019-03-05 DIAGNOSIS — I5042 Chronic combined systolic (congestive) and diastolic (congestive) heart failure: Secondary | ICD-10-CM | POA: Diagnosis not present

## 2019-03-05 DIAGNOSIS — Z794 Long term (current) use of insulin: Secondary | ICD-10-CM

## 2019-03-05 DIAGNOSIS — Z6841 Body Mass Index (BMI) 40.0 and over, adult: Secondary | ICD-10-CM

## 2019-03-05 DIAGNOSIS — I10 Essential (primary) hypertension: Secondary | ICD-10-CM

## 2019-03-05 NOTE — Progress Notes (Signed)
Primary Physician:  Bartholome Bill, MD   Patient ID: [name], female    DOB: 1971/07/16, 48 y.o.   MRN: [id number]  Subjective:    CC: CHF follow up  This visit type was conducted due to national recommendations for restrictions regarding the COVID-19 Pandemic (e.g. social distancing).  This format is felt to be most appropriate for this patient at this time.  All issues noted in this document were discussed and addressed.  No physical exam was performed (except for noted visual exam findings with Telehealth visits).  The patient has consented to conduct a Telehealth visit and understands insurance will be billed.   I discussed the limitations of evaluation and management by telemedicine and the availability of in person appointments. The patient expressed understanding and agreed to proceed.  Virtual Visit via Video Note is as below  I connected with [name], on 03/05/19 at 0900 by telephone and verified that I am speaking with the correct person using two identifiers. Unable to perform video visit as patient did not have equipment.    I have discussed with the patient regarding the safety during COVID Pandemic and steps and precautions including social distancing with the patient.    HPI: [name]  is a 48 y.o. female  with Sjogren's syndrome, SLE, chronic systolic heart failure now with improved EF to 45-50% on echo in Nov 2019, morbid obesity, obesity hypoventilation syndrome, uncontrolled type 2 DM, hypertension, h/o CVA in 2014.  She is on guideline directed medical therapy for heart failure and tolerating well. She has had coronary angiogram in 2017 with normal coronary arteries.  She has been working to lose weight with diet modifications and starting regular exercise and since doing so, symptoms have significantly improved.  She is noticed marked improvement in the distance she is able to walk and also in her shortness of breath and leg edema.  She continues  to have uncontrolled diabetes and is being followed by Dr. Lissa Merlin. Diabetes control has been complicated by steroid use for her sciatica. She reports previously noted diabetic thigh ulceration and neck ulceration that was being followed by wound care has, now completely healed.   She questions using apple cider vinegar for help with weight loss.  Past Medical History:  Diagnosis Date  . Acute renal failure (HCC)     Intractable nausea vomiting secondary to diabetic gastroparesis causing dehydration and acute renal failure /notes 04/01/2013  . Anginal pain ([place])   . Anxiety   . Bell's palsy 08/09/2010  . CAP (community acquired pneumonia) 10/2011   Archie Endo 11/08/2011  . Chest pain 01/13/2014  . CHF (congestive heart failure) ([place])   . Diabetic gastroparesis associated with type 2 diabetes mellitus ([place])    this is presumed diagnoses, not confirmed by any studies.   . Eczema   . Family history of malignant neoplasm of breast   . Family history of malignant neoplasm of ovary   . GERD (gastroesophageal reflux disease)   . High cholesterol   . Hypertension   . Liver mass 2014   biopsied 03/2013 at [facility], not malignant.  is to undergo radiologic ablation of the mass in sept/October 2014.   . Lupus ([place])   . Migraines    "maybe a couple times/yr" (04/01/2013)  . Obesity   . Obstructive sleep apnea on CPAP 2011  . On home oxygen therapy    "2L 24/7" (02/28/2015)  . SJOGREN'S SYNDROME 08/09/2010  . SLE (systemic lupus  erythematosus) ([place])    Archie Endo 12/01/2010  . Stroke [facility] 2010; 10/2012   "left side is still weak from it, never fully regained full strength; no additions from stroke 10/2012"    Past Surgical History:  Procedure Laterality Date  . BREAST CYST EXCISION Left 08/2005   epidermoid  . CARDIAC CATHETERIZATION  02/07/12  . ECTOPIC PREGNANCY SURGERY  1999  . ECTOPIC PREGNANCY SURGERY  1999  . ESOPHAGOGASTRODUODENOSCOPY N/A 02/26/2013   Procedure: ESOPHAGOGASTRODUODENOSCOPY (EGD);   Surgeon: Irene Shipper, MD;  Location: [facility] ENDOSCOPY;  Service: Endoscopy;  Laterality: N/A;  . ESOPHAGOGASTRODUODENOSCOPY N/A 03/02/2015   Procedure: ESOPHAGOGASTRODUODENOSCOPY (EGD);  Surgeon: Milus Banister, MD;  Location: [place];  Service: Endoscopy;  Laterality: N/A;  . HERNIA REPAIR    . LEFT AND RIGHT HEART CATHETERIZATION WITH CORONARY ANGIOGRAM N/A 02/07/2012   Procedure: LEFT AND RIGHT HEART CATHETERIZATION WITH CORONARY ANGIOGRAM;  Surgeon: Laverda Page, MD;  Location: [facility] CATH LAB;  Service: Cardiovascular;  Laterality: N/A;  . LIVER BIOPSY  03/2013   liver mass/medical hx noted above  . MUSCLE BIOPSY     for lupus/notes 12/01/2010  . UMBILICAL HERNIA REPAIR  1980's    Social History   Socioeconomic History  . Marital status: Single    Spouse name: Not on file  . Number of children: 1  . Years of education: Not on file  . Highest education level: Not on file  Occupational History    Employer: UNEMPLOYED    Comment: student  Social Needs  . Financial resource strain: Not on file  . Food insecurity    Worry: Not on file    Inability: Not on file  . Transportation needs    Medical: Not on file    Non-medical: Not on file  Tobacco Use  . Smoking status: Former Smoker    Years: 3.00    Types: Cigarettes    Quit date: 06/02/1991    Years since quitting: 27.7  . Smokeless tobacco: Never Used  Substance and Sexual Activity  . Alcohol use: No    Alcohol/week: 0.0 standard drinks  . Drug use: No  . Sexual activity: Yes    Partners: Male    Birth control/protection: None  Lifestyle  . Physical activity    Days per week: Not on file    Minutes per session: Not on file  . Stress: Not on file  Relationships  . Social Herbalist on phone: Not on file    Gets together: Not on file    Attends religious service: Not on file    Active member of club or organization: Not on file    Attends meetings of clubs or organizations: Not on file    Relationship  status: Not on file  . Intimate partner violence    Fear of current or ex partner: Not on file    Emotionally abused: Not on file    Physically abused: Not on file    Forced sexual activity: Not on file  Other Topics Concern  . Not on file  Social History Narrative   Regular exercise-yes    Review of Systems  Constitution: Positive for weight loss. Negative for decreased appetite, malaise/fatigue and weight gain.  Eyes: Negative for visual disturbance.  Cardiovascular: Positive for dyspnea on exertion (improved). Negative for chest pain, claudication, leg swelling, orthopnea, palpitations and syncope.  Respiratory: Negative for hemoptysis and wheezing.   Endocrine: Negative for cold intolerance and heat intolerance.  Hematologic/Lymphatic:  Negative for bleeding problem. Does not bruise/bleed easily.  Skin: Negative for nail changes.  Musculoskeletal: Positive for joint pain (right knee). Negative for muscle weakness and myalgias.  Gastrointestinal: Negative for abdominal pain, change in bowel habit, nausea and vomiting.  Neurological: Negative for difficulty with concentration, dizziness, focal weakness and headaches.  Psychiatric/Behavioral: Negative for altered mental status and suicidal ideas.  All other systems reviewed and are negative.     Objective:  Blood pressure 132/84, pulse 92, height 5\' 8"  (1.727 m), weight (!) 386 lb (175.1 kg), last menstrual period 03/24/2017. Body mass index is 58.69 kg/m.    Physical Exam  Constitutional: She appears well-developed and well-nourished. No distress.  Morbidly obese  HENT:  Head: Atraumatic.  Eyes: Conjunctivae are normal.  Neck: Neck supple. No JVD present. No thyromegaly present.  Short neck and difficult to evaluate JVP  Cardiovascular: Normal rate, regular rhythm, normal heart sounds and intact distal pulses. Exam reveals no gallop.  No murmur heard. Pulses:      Carotid pulses are 2+ on the right side and 2+ on the left  side.      Dorsalis pedis pulses are 2+ on the right side and 2+ on the left side.       Posterior tibial pulses are 2+ on the right side and 2+ on the left side.  Femoral and popliteal pulse difficult to feel due to patient's body habitus.   Pulmonary/Chest: Effort normal and breath sounds normal.  Abdominal: Soft. Bowel sounds are normal.  Obese. Pannus present  Musculoskeletal: Normal range of motion.        General: No edema.  Neurological: She is alert.  Skin: Skin is warm and dry.  Psychiatric: She has a normal mood and affect.   Radiology: No results found.  Laboratory examination:    CMP Latest Ref Rng & Units 11/01/2018 03/26/2018 09/23/2017  Glucose 65 - 99 mg/dL 273(H) 145(H) 167(H)  BUN 6 - 24 mg/dL 8 9 16   Creatinine 0.57 - 1.00 mg/dL 0.90 1.10(H) 0.97  Sodium 134 - 144 mmol/L 140 142 137  Potassium 3.5 - 5.2 mmol/L 4.2 3.8 3.6  Chloride 96 - 106 mmol/L 103 112(H) 102  CO2 20 - 29 mmol/L 22 20(L) 24  Calcium 8.7 - 10.2 mg/dL 9.3 8.9 8.2(L)  Total Protein 6.5 - 8.1 g/dL - - -  Total Bilirubin 0.3 - 1.2 mg/dL - - -  Alkaline Phos 38 - 126 U/L - - -  AST 15 - 41 U/L - - -  ALT 14 - 54 U/L - - -   CBC Latest Ref Rng & Units 03/26/2018 09/21/2017 09/20/2017  WBC 4.0 - 10.5 K/uL 8.7 16.8(H) 18.5(H)  Hemoglobin 12.0 - 15.0 g/dL 11.8(L) 12.3 11.6(L)  Hematocrit 36.0 - 46.0 % 38.0 38.8 36.4  Platelets 150 - 400 K/uL 333 373 339   Lipid Panel     Component Value Date/Time   CHOL 160 03/06/2014 1425   TRIG 125 03/06/2014 1425   HDL 57 03/06/2014 1425   CHOLHDL 2.8 03/06/2014 1425   VLDL 25 03/06/2014 1425   LDLCALC 78 03/06/2014 1425   HEMOGLOBIN A1C Lab Results  Component Value Date   HGBA1C 11.7 (A) 01/01/2019   MPG 148 02/28/2015   TSH No results for input(s): TSH in the last 8760 hours.  PRN Meds:. Medications Discontinued During This Encounter  Medication Reason  . carvedilol (COREG) 12.5 MG tablet Dose change   Current Meds  Medication Sig  .  albuterol (PROAIR HFA) 108 (90 BASE) MCG/ACT inhaler Inhale 2 puffs into the lungs every 6 (six) hours as needed for wheezing or shortness of breath.   Marland Kitchen albuterol (PROVENTIL) (2.5 MG/3ML) 0.083% nebulizer solution Take 2.5 mg by nebulization every 6 (six) hours as needed for wheezing.  Marland Kitchen ALPRAZolam (XANAX) 0.5 MG tablet Take 0.5 mg 2 (two) times daily as needed by mouth for anxiety or sleep.   Marland Kitchen amLODipine (NORVASC) 5 MG tablet Take 1 tablet (5 mg total) by mouth daily.  Marland Kitchen aspirin EC 81 MG tablet Take 81 mg by mouth daily.  Marland Kitchen azaTHIOprine (IMURAN) 50 MG tablet Take 100 mg by mouth 2 (two) times daily.   . benzonatate (TESSALON) 100 MG capsule Take by mouth continuous as needed for cough.  Marland Kitchen BIDIL 20-37.5 MG tablet TAKE 2 TABLETS BY MOUTH THREE TIMES DAILY  . buPROPion (WELLBUTRIN) 100 MG tablet Take 150 mg by mouth every morning.   . carvedilol (COREG) 12.5 MG tablet Take 12.5 mg by mouth 2 (two) times daily with a meal. 1.5 tablets bid  . carvedilol (COREG) 25 MG tablet TAKE 1 AND 1/2 TABLETS BY MOUTH TWICE DAILY  . cetirizine (ZYRTEC) 10 MG tablet Take 10 mg by mouth daily as needed for allergies.   Marland Kitchen clopidogrel (PLAVIX) 75 MG tablet Take 75 mg by mouth daily.  . clotrimazole-betamethasone (LOTRISONE) cream Apply 1 application topically 2 (two) times daily.  . cyanocobalamin (,VITAMIN B-12,) 1000 MCG/ML injection Inject into the muscle. Every other month  . cycloSPORINE (RESTASIS) 0.05 % ophthalmic emulsion Place 1 drop into both eyes 3 (three) times daily.  . diclofenac sodium (VOLTAREN) 1 % GEL Apply 2-4 g 4 (four) times daily as needed topically (joint pain).  Marland Kitchen doxepin (SINEQUAN) 25 MG capsule Take 25 mg by mouth daily.   . DULoxetine (CYMBALTA) 60 MG capsule Take 60 mg by mouth at bedtime.  Marland Kitchen etonogestrel-ethinyl estradiol (NUVARING) 0.12-0.015 MG/24HR vaginal ring Place 1 each every 28 (twenty-eight) days vaginally. Insert vaginally and leave in place for 3 consecutive weeks, then remove  for 1 week.  Marland Kitchen exenatide (BYETTA) 10 MCG/0.04ML SOPN injection Inject 0.04 mLs (10 mcg total) into the skin 2 (two) times daily with a meal. And pen needles 2/day  . famotidine (PEPCID) 20 MG tablet Take 20 mg by mouth 2 (two) times daily.  . fluticasone (FLONASE) 50 MCG/ACT nasal spray Place 2 sprays into both nostrils continuous as needed for allergies or rhinitis.  . hydroxychloroquine (PLAQUENIL) 200 MG tablet Take 200 mg by mouth 2 (two) times daily.   . insulin NPH Human (HUMULIN N) 100 UNIT/ML injection Inject 2.2 mLs (220 Units total) into the skin every morning. And syringes 3/day  . megestrol (MEGACE) 40 MG tablet Take 40 mg by mouth daily.  . metFORMIN (GLUCOPHAGE-XR) 500 MG 24 hr tablet Take 1,000 mg by mouth 2 (two) times daily.  . mometasone-formoterol (DULERA) 100-5 MCG/ACT AERO Inhale 2 puffs into the lungs 2 (two) times daily.  Marland Kitchen oxybutynin (DITROPAN-XL) 10 MG 24 hr tablet Take 15 mg by mouth daily.   . pravastatin (PRAVACHOL) 10 MG tablet Take 10 mg by mouth daily.  . predniSONE (DELTASONE) 1 MG tablet Take 3 mg by mouth daily.   . pregabalin (LYRICA) 75 MG capsule Take 75 mg 3 (three) times daily by mouth.   . psyllium (METAMUCIL MULTIHEALTH FIBER) 58.6 % powder Take 1 packet by mouth 2 (two) times daily.  . sacubitril-valsartan (ENTRESTO) 97-103 MG Take 1 tablet by  mouth 2 (two) times daily.  Marland Kitchen spironolactone (ALDACTONE) 50 MG tablet Take 1 tablet (50 mg total) by mouth daily.  Marland Kitchen topiramate (TOPAMAX) 25 MG tablet Take 75 mg by mouth at bedtime.  . torsemide (DEMADEX) 20 MG tablet 40 mg in AM and 20 mg in PM (Patient taking differently: Take 20 mg by mouth 2 (two) times daily. )  . traMADol (ULTRAM) 50 MG tablet TAKE 1 TO 2 TABLETS BY MOUTH EVERY 6 HOURS AS NEEDED FOR PAIN**PA REQ**    Cardiac Studies:   Echocardiogram 12/19/2017: Left ventricle cavity is mildly dilated. Mild concentric hypertrophy of the left ventricle. Mild decrease in global wall motion. Doppler evidence  of grade I (impaired) diastolic dysfunction, normal LAP. Calculated EF 47%. Left atrial cavity is moderately dilated. Mild (Grade I) mitral regurgitation. Mild prolapse of the mitral valve leaflets. Mild tricuspid regurgitation. No evidence of pulmonary hypertension. No significant change compared to previous study dated 03/09/2017.  Vascular Ultrasound Lower Extremity Venous 03/26/2018: Right: There is no evidence of deep vein thrombosis in the lower extremity. No cystic structure found in the popliteal fossa. Left: No evidence of common femoral vein obstruction.  Carotid duplex 03/06/2014: The vertebral arteries appear patent with antegrade flow. Findings consistent with 1-39 percent stenosis involving theright internal carotid artery and the left internal carotidartery.  Coronary angiogram 02/07/2012: Normal coronary arteries and LVEF. Left dominant circulation.  Assessment:     ICD-10-CM   1. Chronic combined systolic and diastolic heart failure (HCC)  I50.42   2. Essential hypertension  I10   3. Uncontrolled type 2 diabetes mellitus, with long-term current use of insulin (HCC)  E11.65    Z79.4   4. Morbid obesity with BMI of 60.0-69.9, adult ([place])  E66.01    Z68.44       Recommendations:   Patient is presently doing well without any complaints today.  She has started exercising and making diet changes to promote weight loss.  Reports losing approximately 15 pounds over the last few months.  She has also been walking regularly and is noticed significant improvement in her shortness of breath and activity intolerance and states that she is now able to walk several laps without having to stop.  I have congratulated her on this and provided positive reinforcement.  I have advised her that I am unaware of any side effects with apple cider vinegar and possible interactions with her medications, I have advised that she discuss with her Endocrinologist as there appears to be some side  effects of hyoglycemia and hypokalemia. Feel that she should probably avoid.    Blood pressure remains well controlled, she is tolerating medications well.  We will continue the same.  Her diabetes does continue to be uncontrolled with last hemoglobin A1c of 11%.  She is closely followed by endocrinology.  I am overall pleased with her progress, I will see her back in 3 months for close monitoring, but encouraged her to contact me sooner if needed.  Miquel Dunn, MSN, APRN, FNP-C [facility] Cardi[facility]. [place] Office: [phone] Fax: [phone]

## 2019-03-21 ENCOUNTER — Ambulatory Visit: Payer: Medicaid Other | Admitting: Endocrinology

## 2019-04-03 ENCOUNTER — Other Ambulatory Visit: Payer: Self-pay | Admitting: Cardiology

## 2019-04-07 ENCOUNTER — Other Ambulatory Visit: Payer: Self-pay | Admitting: Cardiology

## 2019-06-04 ENCOUNTER — Other Ambulatory Visit: Payer: Self-pay | Admitting: Cardiology

## 2019-06-04 DIAGNOSIS — I5023 Acute on chronic systolic (congestive) heart failure: Secondary | ICD-10-CM

## 2019-06-07 ENCOUNTER — Telehealth: Payer: Medicaid Other | Admitting: Cardiology

## 2019-06-07 ENCOUNTER — Encounter: Payer: Self-pay | Admitting: Cardiology

## 2019-06-07 ENCOUNTER — Other Ambulatory Visit: Payer: Self-pay

## 2019-06-07 VITALS — BP 132/98 | Ht 68.0 in | Wt 389.0 lb

## 2019-06-07 DIAGNOSIS — I5042 Chronic combined systolic (congestive) and diastolic (congestive) heart failure: Secondary | ICD-10-CM

## 2019-06-07 DIAGNOSIS — IMO0002 Reserved for concepts with insufficient information to code with codable children: Secondary | ICD-10-CM

## 2019-06-07 DIAGNOSIS — I10 Essential (primary) hypertension: Secondary | ICD-10-CM | POA: Diagnosis not present

## 2019-06-07 DIAGNOSIS — E1165 Type 2 diabetes mellitus with hyperglycemia: Secondary | ICD-10-CM

## 2019-06-07 DIAGNOSIS — Z6841 Body Mass Index (BMI) 40.0 and over, adult: Secondary | ICD-10-CM

## 2019-06-07 DIAGNOSIS — Z794 Long term (current) use of insulin: Secondary | ICD-10-CM

## 2019-06-07 NOTE — Progress Notes (Signed)
Primary Physician:  Bartholome Bill, MD   Patient ID: [name], female    DOB: [birth year], 48 y.o.   MRN: [id number]  Subjective:    CC: CHF follow up  This visit type was conducted due to national recommendations for restrictions regarding the COVID-19 Pandemic (e.g. social distancing).  This format is felt to be most appropriate for this patient at this time.  All issues noted in this document were discussed and addressed.  No physical exam was performed (except for noted visual exam findings with Telehealth visits).  The patient has consented to conduct a Telehealth visit and understands insurance will be billed.   I discussed the limitations of evaluation and management by telemedicine and the availability of in person appointments. The patient expressed understanding and agreed to proceed.  Virtual Visit via Video Note is as below  I connected with [name], on 06/07/19 at 1420 by telephone and verified that I am speaking with the correct person using two identifiers. Unable to perform video visit as patient did not have equipment.    I have discussed with the patient regarding the safety during COVID Pandemic and steps and precautions including social distancing with the patient.    HPI: [name]  is a 48 y.o. female  with Sjogren's syndrome, SLE, chronic systolic heart failure now with improved EF to 45-50% on echo in Nov 2019, morbid obesity, obesity hypoventilation syndrome, uncontrolled type 2 DM, hypertension, h/o CVA in 2014.  She is on guideline directed medical therapy for heart failure and tolerating well. She has had coronary angiogram in 2017 with normal coronary arteries.  She has been working to lose weight with diet modifications and starting regular exercise.  She is now able to walk further distances without getting short of breath.  She is currently battling a cold with congestion and sore throat, hence this was made a virtual visit.  She continues  to have uncontrolled diabetes and is being followed by Dr. Lissa Merlin, states that she is due to have her hemoglobin A1c checked.  She reports her blood sugars at home have been fairly well controlled.  Diabetes control has been complicated by steroid use for her sciatica; however, her prednisone dose was recently reduced by her rheumatologist.    Past Medical History:  Diagnosis Date  . Acute renal failure (HCC)     Intractable nausea vomiting secondary to diabetic gastroparesis causing dehydration and acute renal failure /notes 04/01/2013  . Anginal pain ([place])   . Anxiety   . Bell's palsy 08/09/2010  . CAP (community acquired pneumonia) 10/2011   Archie Endo 11/08/2011  . Chest pain 01/13/2014  . CHF (congestive heart failure) ([place])   . Diabetic gastroparesis associated with type 2 diabetes mellitus ([place])    this is presumed diagnoses, not confirmed by any studies.   . Eczema   . Family history of malignant neoplasm of breast   . Family history of malignant neoplasm of ovary   . GERD (gastroesophageal reflux disease)   . High cholesterol   . Hypertension   . Liver mass 2014   biopsied 03/2013 at [facility], not malignant.  is to undergo radiologic ablation of the mass in sept/October 2014.   . Lupus ([place])   . Migraines    "maybe a couple times/yr" (04/01/2013)  . Obesity   . Obstructive sleep apnea on CPAP 2011  . On home oxygen therapy    "2L 24/7" (02/28/2015)  . SJOGREN'S SYNDROME 08/09/2010  .  SLE (systemic lupus erythematosus) ([place])    Archie Endo 12/01/2010  . Stroke [facility]) 2010; 10/2012   "left side is still weak from it, never fully regained full strength; no additions from stroke 10/2012"    Past Surgical History:  Procedure Laterality Date  . BREAST CYST EXCISION Left 08/2005   epidermoid  . CARDIAC CATHETERIZATION  02/07/12  . ECTOPIC PREGNANCY SURGERY  1999  . ECTOPIC PREGNANCY SURGERY  1999  . ESOPHAGOGASTRODUODENOSCOPY N/A 02/26/2013   Procedure: ESOPHAGOGASTRODUODENOSCOPY (EGD);   Surgeon: Irene Shipper, MD;  Location: [facility] ENDOSCOPY;  Service: Endoscopy;  Laterality: N/A;  . ESOPHAGOGASTRODUODENOSCOPY N/A 03/02/2015   Procedure: ESOPHAGOGASTRODUODENOSCOPY (EGD);  Surgeon: Milus Banister, MD;  Location: [facility];  Service: Endoscopy;  Laterality: N/A;  . HERNIA REPAIR    . LEFT AND RIGHT HEART CATHETERIZATION WITH CORONARY ANGIOGRAM N/A 02/07/2012   Procedure: LEFT AND RIGHT HEART CATHETERIZATION WITH CORONARY ANGIOGRAM;  Surgeon: Laverda Page, MD;  Location: [facility] CATH LAB;  Service: Cardiovascular;  Laterality: N/A;  . LIVER BIOPSY  03/2013   liver mass/medical hx noted above  . MUSCLE BIOPSY     for lupus/notes 12/01/2010  . UMBILICAL HERNIA REPAIR  1980's    Social History   Socioeconomic History  . Marital status: Single    Spouse name: Not on file  . Number of children: 1  . Years of education: Not on file  . Highest education level: Not on file  Occupational History    Employer: UNEMPLOYED    Comment: student  Social Needs  . Financial resource strain: Not on file  . Food insecurity    Worry: Not on file    Inability: Not on file  . Transportation needs    Medical: Not on file    Non-medical: Not on file  Tobacco Use  . Smoking status: Former Smoker    Packs/day: 0.50    Years: 3.00    Pack years: 1.50    Types: Cigarettes    Quit date: 06/02/1991    Years since quitting: 28.0  . Smokeless tobacco: Never Used  . Tobacco comment: quit 28 yrs ago  Substance and Sexual Activity  . Alcohol use: No    Alcohol/week: 0.0 standard drinks  . Drug use: No  . Sexual activity: Yes    Partners: Male    Birth control/protection: None  Lifestyle  . Physical activity    Days per week: Not on file    Minutes per session: Not on file  . Stress: Not on file  Relationships  . Social Herbalist on phone: Not on file    Gets together: Not on file    Attends religious service: Not on file    Active member of club or organization: Not on file     Attends meetings of clubs or organizations: Not on file    Relationship status: Not on file  . Intimate partner violence    Fear of current or ex partner: Not on file    Emotionally abused: Not on file    Physically abused: Not on file    Forced sexual activity: Not on file  Other Topics Concern  . Not on file  Social History Narrative   Regular exercise-yes    Review of Systems  Constitution: Positive for weight loss. Negative for decreased appetite, malaise/fatigue and weight gain.  HENT: Positive for congestion and sore throat.   Eyes: Negative for visual disturbance.  Cardiovascular: Positive for dyspnea on exertion (  improved). Negative for chest pain, claudication, leg swelling, orthopnea, palpitations and syncope.  Respiratory: Negative for hemoptysis and wheezing.   Endocrine: Negative for cold intolerance and heat intolerance.  Hematologic/Lymphatic: Negative for bleeding problem. Does not bruise/bleed easily.  Skin: Negative for nail changes.  Musculoskeletal: Positive for joint pain (right knee). Negative for muscle weakness and myalgias.  Gastrointestinal: Negative for abdominal pain, change in bowel habit, nausea and vomiting.  Neurological: Negative for difficulty with concentration, dizziness, focal weakness and headaches.  Psychiatric/Behavioral: Negative for altered mental status and suicidal ideas.  All other systems reviewed and are negative.     Objective:  Blood pressure (!) 132/98, height 5\' 8"  (1.727 m), weight (!) 389 lb (176.4 kg), last menstrual period 03/24/2017. Body mass index is 59.15 kg/m.    Physical exam not performed or limited due to virtual visit.    Please see exam details from prior visit is as below.   Physical Exam  Constitutional: She appears well-developed and well-nourished. No distress.  Morbidly obese  HENT:  Head: Atraumatic.  Eyes: Conjunctivae are normal.  Neck: Neck supple. No JVD present. No thyromegaly present.  Short  neck and difficult to evaluate JVP  Cardiovascular: Normal rate, regular rhythm, normal heart sounds and intact distal pulses. Exam reveals no gallop.  No murmur heard. Pulses:      Carotid pulses are 2+ on the right side and 2+ on the left side.      Dorsalis pedis pulses are 2+ on the right side and 2+ on the left side.       Posterior tibial pulses are 2+ on the right side and 2+ on the left side.  Femoral and popliteal pulse difficult to feel due to patient's body habitus.   Pulmonary/Chest: Effort normal and breath sounds normal.  Abdominal: Soft. Bowel sounds are normal.  Obese. Pannus present  Musculoskeletal: Normal range of motion.        General: No edema.  Neurological: She is alert.  Skin: Skin is warm and dry.  Psychiatric: She has a normal mood and affect.   Radiology: No results found.  Laboratory examination:    CMP Latest Ref Rng & Units 11/01/2018 03/26/2018 09/23/2017  Glucose 65 - 99 mg/dL 273(H) 145(H) 167(H)  BUN 6 - 24 mg/dL 8 9 16   Creatinine 0.57 - 1.00 mg/dL 0.90 1.10(H) 0.97  Sodium 134 - 144 mmol/L 140 142 137  Potassium 3.5 - 5.2 mmol/L 4.2 3.8 3.6  Chloride 96 - 106 mmol/L 103 112(H) 102  CO2 20 - 29 mmol/L 22 20(L) 24  Calcium 8.7 - 10.2 mg/dL 9.3 8.9 8.2(L)  Total Protein 6.5 - 8.1 g/dL - - -  Total Bilirubin 0.3 - 1.2 mg/dL - - -  Alkaline Phos 38 - 126 U/L - - -  AST 15 - 41 U/L - - -  ALT 14 - 54 U/L - - -   CBC Latest Ref Rng & Units 03/26/2018 09/21/2017 09/20/2017  WBC 4.0 - 10.5 K/uL 8.7 16.8(H) 18.5(H)  Hemoglobin 12.0 - 15.0 g/dL 11.8(L) 12.3 11.6(L)  Hematocrit 36.0 - 46.0 % 38.0 38.8 36.4  Platelets 150 - 400 K/uL 333 373 339   Lipid Panel     Component Value Date/Time   CHOL 160 03/06/2014 1425   TRIG 125 03/06/2014 1425   HDL 57 03/06/2014 1425   CHOLHDL 2.8 03/06/2014 1425   VLDL 25 03/06/2014 1425   LDLCALC 78 03/06/2014 1425   HEMOGLOBIN A1C Lab Results  Component Value  Date   HGBA1C 11.7 (A) 01/01/2019   MPG 148  02/28/2015   TSH No results for input(s): TSH in the last 8760 hours.  PRN Meds:. Medications Discontinued During This Encounter  Medication Reason  . carvedilol (COREG) 25 MG tablet Change in therapy  . Olopatadine HCl (PAZEO) 0.7 % SOLN Completed Course   Current Meds  Medication Sig  . albuterol (PROAIR HFA) 108 (90 BASE) MCG/ACT inhaler Inhale 2 puffs into the lungs every 6 (six) hours as needed for wheezing or shortness of breath.   Marland Kitchen albuterol (PROVENTIL) (2.5 MG/3ML) 0.083% nebulizer solution Take 2.5 mg by nebulization every 6 (six) hours as needed for wheezing.  Marland Kitchen ALPRAZolam (XANAX) 0.5 MG tablet Take 0.5 mg 2 (two) times daily as needed by mouth for anxiety or sleep.   Marland Kitchen amLODipine (NORVASC) 5 MG tablet Take 1 tablet (5 mg total) by mouth daily.  Marland Kitchen aspirin EC 81 MG tablet Take 81 mg by mouth daily.  Marland Kitchen azaTHIOprine (IMURAN) 50 MG tablet Take 100 mg by mouth 2 (two) times daily.   . benzonatate (TESSALON) 100 MG capsule Take by mouth continuous as needed for cough.  Marland Kitchen BIDIL 20-37.5 MG tablet TAKE TWO (2) TABLETS BY MOUTH THREE TIMES DAILY   . BUPROPION HCL PO Take 300 mg by mouth every morning.   . carvedilol (COREG) 12.5 MG tablet Take 12.5 mg by mouth 2 (two) times daily with a meal.   . cetirizine (ZYRTEC) 10 MG tablet Take 10 mg by mouth daily as needed for allergies.   Marland Kitchen clopidogrel (PLAVIX) 75 MG tablet Take 75 mg by mouth daily.  . clotrimazole-betamethasone (LOTRISONE) cream Apply 1 application topically 2 (two) times daily. (Patient taking differently: Apply 1 application topically as needed. )  . cyanocobalamin (,VITAMIN B-12,) 1000 MCG/ML injection Inject into the muscle. Every other month  . cycloSPORINE (RESTASIS) 0.05 % ophthalmic emulsion Place 1 drop into both eyes 3 (three) times daily.  . diclofenac sodium (VOLTAREN) 1 % GEL Apply 2-4 g 4 (four) times daily as needed topically (joint pain).  Marland Kitchen doxepin (SINEQUAN) 25 MG capsule Take 25 mg by mouth daily.   .  DULoxetine (CYMBALTA) 60 MG capsule Take 60 mg by mouth at bedtime.  Marland Kitchen ENTRESTO 97-103 MG TAKE 1 TABLET BY MOUTH TWICE A DAY  . etonogestrel-ethinyl estradiol (NUVARING) 0.12-0.015 MG/24HR vaginal ring Place 1 each every 28 (twenty-eight) days vaginally. Insert vaginally and leave in place for 3 consecutive weeks, then remove for 1 week.  Marland Kitchen exenatide (BYETTA) 10 MCG/0.04ML SOPN injection Inject 0.04 mLs (10 mcg total) into the skin 2 (two) times daily with a meal. And pen needles 2/day  . famotidine (PEPCID) 20 MG tablet Take 20 mg by mouth 2 (two) times daily.  . fluticasone (FLONASE) 50 MCG/ACT nasal spray Place 2 sprays into both nostrils continuous as needed for allergies or rhinitis.  . hydroxychloroquine (PLAQUENIL) 200 MG tablet Take 200 mg by mouth 2 (two) times daily.   . insulin NPH Human (HUMULIN N) 100 UNIT/ML injection Inject 2.2 mLs (220 Units total) into the skin every morning. And syringes 3/day (Patient taking differently: Inject 200 Units into the skin every morning. And syringes 3/day)  . megestrol (MEGACE) 40 MG tablet Take 40 mg by mouth daily.  . metFORMIN (GLUCOPHAGE-XR) 500 MG 24 hr tablet Take 1,000 mg by mouth 2 (two) times daily.  . mometasone-formoterol (DULERA) 100-5 MCG/ACT AERO Inhale 2 puffs into the lungs 2 (two) times daily.  Marland Kitchen oxybutynin (  DITROPAN-XL) 10 MG 24 hr tablet Take 15 mg by mouth daily.   . Polyvinyl Alcohol-Povidone (REFRESH OP) Place 1 drop 2 (two) times daily into both eyes.   . pravastatin (PRAVACHOL) 10 MG tablet Take 10 mg by mouth daily.  . predniSONE (DELTASONE) 1 MG tablet Take 3 mg by mouth daily.   . pregabalin (LYRICA) 75 MG capsule Take 75 mg 3 (three) times daily by mouth.   . psyllium (METAMUCIL MULTIHEALTH FIBER) 58.6 % powder Take 1 packet by mouth 2 (two) times daily.  Marland Kitchen spironolactone (ALDACTONE) 50 MG tablet TAKE 1 TABLET BY MOUTH EVERY DAY  . topiramate (TOPAMAX) 25 MG tablet Take 75 mg by mouth at bedtime.  . torsemide (DEMADEX) 20  MG tablet 40 mg in AM and 20 mg in PM (Patient taking differently: Take 20 mg by mouth 2 (two) times daily. )  . traMADol (ULTRAM) 50 MG tablet TAKE 1 TO 2 TABLETS BY MOUTH EVERY 6 HOURS AS NEEDED FOR PAIN**PA REQ**    Cardiac Studies:   Echocardiogram 12/19/2017: Left ventricle cavity is mildly dilated. Mild concentric hypertrophy of the left ventricle. Mild decrease in global wall motion. Doppler evidence of grade I (impaired) diastolic dysfunction, normal LAP. Calculated EF 47%. Left atrial cavity is moderately dilated. Mild (Grade I) mitral regurgitation. Mild prolapse of the mitral valve leaflets. Mild tricuspid regurgitation. No evidence of pulmonary hypertension. No significant change compared to previous study dated 03/09/2017.  Vascular Ultrasound Lower Extremity Venous 03/26/2018: Right: There is no evidence of deep vein thrombosis in the lower extremity. No cystic structure found in the popliteal fossa. Left: No evidence of common femoral vein obstruction.  Carotid duplex 03/06/2014: The vertebral arteries appear patent with antegrade flow. Findings consistent with 1-39 percent stenosis involving theright internal carotid artery and the left internal carotidartery.  Coronary angiogram 02/07/2012: Normal coronary arteries and LVEF. Left dominant circulation.  Assessment:     ICD-10-CM   1. Chronic combined systolic and diastolic heart failure (HCC)  I50.42   2. Essential hypertension  I10   3. Uncontrolled type 2 diabetes mellitus, with long-term current use of insulin (HCC)  E11.65    Z79.4   4. Morbid obesity with BMI of 50.0-59.9, adult ([place])  E66.01    Z68.43       Recommendations:   Patient is presently doing well from a cardiac standpoint.  If she has any worsening URI symptoms, recommended that she be evaluated by her PCP.  She continues to have improvement in dyspnea on exertion with regular exercise.  I have encouraged her to continue with diet modifications and  regular exercise to help with this.  Her weight has been overall stable and she is continuing to work on this.  Her blood pressure was elevated today, but has generally been very well controlled.  She thinks that this is elevated today as her brother is currently on life support and she has a lot of stress regarding this.  She will send me a message in 1 week letting me know her blood pressure readings and will make further changes at that time.  She is scheduled to see her endocrinologist in the next few weeks for follow-up on her diabetes.  Stressed the importance of continued efforts towards adequate diabetes control.  At this was a virtual appointment, I would like to see her in the office in 6 weeks for follow-up on CHF.  Miquel Dunn, MSN, APRN, FNP-C [facility] Cardiovascular. [place] Office: [phone] Fax: [phone]

## 2019-07-08 ENCOUNTER — Telehealth: Payer: Self-pay | Admitting: Endocrinology

## 2019-07-08 ENCOUNTER — Other Ambulatory Visit: Payer: Self-pay | Admitting: Endocrinology

## 2019-07-08 DIAGNOSIS — E1143 Type 2 diabetes mellitus with diabetic autonomic (poly)neuropathy: Secondary | ICD-10-CM

## 2019-07-08 DIAGNOSIS — Z794 Long term (current) use of insulin: Secondary | ICD-10-CM

## 2019-07-09 ENCOUNTER — Other Ambulatory Visit: Payer: Self-pay

## 2019-07-09 DIAGNOSIS — E1143 Type 2 diabetes mellitus with diabetic autonomic (poly)neuropathy: Secondary | ICD-10-CM

## 2019-07-09 DIAGNOSIS — Z794 Long term (current) use of insulin: Secondary | ICD-10-CM

## 2019-07-09 MED ORDER — EXENATIDE 10 MCG/0.04ML ~~LOC~~ SOPN: 10.0000 ug | PEN_INJECTOR | Freq: Two times a day (BID) | SUBCUTANEOUS | 0 refills | Status: DC

## 2019-07-09 MED ORDER — EASY TOUCH PEN NEEDLES 32G X 4 MM MISC: 1.0000 | Freq: Two times a day (BID) | 10 refills | Status: DC

## 2019-07-09 NOTE — Telephone Encounter (Signed)
Please advise 

## 2019-07-09 NOTE — Telephone Encounter (Signed)
1.  Please schedule f/u appt 2.  Then please refill x 1, pending that appt.  

## 2019-07-09 NOTE — Telephone Encounter (Signed)
Patient has been scheduled for an appointment on 07/18/19 at 8:45 a.m.

## 2019-07-09 NOTE — Telephone Encounter (Signed)
exenatide (BYETTA) 10 MCG/0.04ML SOPN injection 2.4 mL 0 07/09/2019    Sig - Route: Inject 0.04 mLs (10 mcg total) into the skin 2 (two) times daily with a meal. - Subcutaneous   Sent to pharmacy as: exenatide (BYETTA) 10 MCG/0.04ML Solution Pen-injector injection   E-Prescribing Status: Receipt confirmed by pharmacy (07/09/2019 10:41 AM EST)

## 2019-07-09 NOTE — Telephone Encounter (Signed)
Per Dr. Loanne Drilling, unable to refill Byetta without an appt. Routing this message to the front desk for scheduling purposes.

## 2019-07-16 ENCOUNTER — Other Ambulatory Visit: Payer: Self-pay

## 2019-07-18 ENCOUNTER — Ambulatory Visit (INDEPENDENT_AMBULATORY_CARE_PROVIDER_SITE_OTHER): Payer: Medicaid Other | Admitting: Endocrinology

## 2019-07-18 ENCOUNTER — Encounter: Payer: Self-pay | Admitting: Endocrinology

## 2019-07-18 ENCOUNTER — Ambulatory Visit: Payer: Medicaid Other | Admitting: Endocrinology

## 2019-07-18 VITALS — BP 110/78 | HR 115 | Ht 68.0 in | Wt >= 6400 oz

## 2019-07-18 DIAGNOSIS — Z794 Long term (current) use of insulin: Secondary | ICD-10-CM

## 2019-07-18 DIAGNOSIS — E1143 Type 2 diabetes mellitus with diabetic autonomic (poly)neuropathy: Secondary | ICD-10-CM

## 2019-07-18 LAB — POCT GLYCOSYLATED HEMOGLOBIN (HGB A1C): Hemoglobin A1C: 9.4 % — AB (ref 4.0–5.6)

## 2019-07-18 MED ORDER — INSULIN NPH (HUMAN) (ISOPHANE) 100 UNIT/ML ~~LOC~~ SUSP: 200.0000 [IU] | SUBCUTANEOUS | 11 refills | Status: DC

## 2019-07-18 NOTE — Progress Notes (Signed)
Subjective:    Patient ID: [name], female    DOB: 23-Aug-1970, 48 y.o.   MRN: [id number]  HPI Pt returns for f/u of diabetes mellitus: DM type: Insulin-requiring type 2 Dx'ed: 7001 Complications: polyneuropathy and gastroparesis.  Therapy: insulin (since 2014), [place], and metformin.   GDM: never.  DKA: never.  Severe hypoglycemia: never.  Pancreatitis: never.  Other: she is too ill to undergo weight-loss surgery; she takes qd insulin, after poor results with multiple daily injections; she takes NPH, due to pattern of cbg's.   Interval history: She takes 200 units qam.  no cbg record, but states when she is on the insulin, cbg's vary from 98-178.  It is in general higher as the day goes on.  Last month, she was off insulin x 1 week, when she was in [facility].  Prednisone is down to 2 mg qd.   Past Medical History:  Diagnosis Date  . Acute renal failure (HCC)     Intractable nausea vomiting secondary to diabetic gastroparesis causing dehydration and acute renal failure /notes 04/01/2013  . Anginal pain ([place])   . Anxiety   . Bell's palsy 08/09/2010  . CAP (community acquired pneumonia) 10/2011   Archie Endo 11/08/2011  . Chest pain 01/13/2014  . CHF (congestive heart failure) ([place])   . Diabetic gastroparesis associated with type 2 diabetes mellitus ([place])    this is presumed diagnoses, not confirmed by any studies.   . Eczema   . Family history of malignant neoplasm of breast   . Family history of malignant neoplasm of ovary   . GERD (gastroesophageal reflux disease)   . High cholesterol   . Hypertension   . Liver mass 2014   biopsied 03/2013 at [facility], not malignant.  is to undergo radiologic ablation of the mass in sept/October 2014.   . Lupus ([place])   . Migraines    "maybe a couple times/yr" (04/01/2013)  . Obesity   . Obstructive sleep apnea on CPAP 2011  . On home oxygen therapy    "2L 24/7" (02/28/2015)  . SJOGREN'S SYNDROME 08/09/2010  . SLE (systemic lupus erythematosus)  ([place])    Archie Endo 12/01/2010  . Stroke [facility]) 2010; 10/2012   "left side is still weak from it, never fully regained full strength; no additions from stroke 10/2012"    Past Surgical History:  Procedure Laterality Date  . BREAST CYST EXCISION Left 08/2005   epidermoid  . CARDIAC CATHETERIZATION  02/07/12  . ECTOPIC PREGNANCY SURGERY  1999  . ECTOPIC PREGNANCY SURGERY  1999  . ESOPHAGOGASTRODUODENOSCOPY N/A 02/26/2013   Procedure: ESOPHAGOGASTRODUODENOSCOPY (EGD);  Surgeon: Irene Shipper, MD;  Location: [facility] ENDOSCOPY;  Service: Endoscopy;  Laterality: N/A;  . ESOPHAGOGASTRODUODENOSCOPY N/A 03/02/2015   Procedure: ESOPHAGOGASTRODUODENOSCOPY (EGD);  Surgeon: Milus Banister, MD;  Location: [place];  Service: Endoscopy;  Laterality: N/A;  . HERNIA REPAIR    . LEFT AND RIGHT HEART CATHETERIZATION WITH CORONARY ANGIOGRAM N/A 02/07/2012   Procedure: LEFT AND RIGHT HEART CATHETERIZATION WITH CORONARY ANGIOGRAM;  Surgeon: Laverda Page, MD;  Location: [facility] CATH LAB;  Service: Cardiovascular;  Laterality: N/A;  . LIVER BIOPSY  03/2013   liver mass/medical hx noted above  . MUSCLE BIOPSY     for lupus/notes 12/01/2010  . UMBILICAL HERNIA REPAIR  1980's    Social History   Socioeconomic History  . Marital status: Single    Spouse name: Not on file  . Number of children: 1  . Years of education:  Not on file  . Highest education level: Not on file  Occupational History    Employer: UNEMPLOYED    Comment: student  Tobacco Use  . Smoking status: Former Smoker    Packs/day: 0.50    Years: 3.00    Pack years: 1.50    Types: Cigarettes    Quit date: 06/02/1991    Years since quitting: 28.1  . Smokeless tobacco: Never Used  . Tobacco comment: quit 28 yrs ago  Substance and Sexual Activity  . Alcohol use: No    Alcohol/week: 0.0 standard drinks  . Drug use: No  . Sexual activity: Yes    Partners: Male    Birth control/protection: None  Other Topics Concern  . Not on file  Social History  Narrative   Regular exercise-yes   Social Determinants of Health   Financial Resource Strain:   . Difficulty of Paying Living Expenses: Not on file  Food Insecurity:   . Worried About Charity fundraiser in the Last Year: Not on file  . Ran Out of Food in the Last Year: Not on file  Transportation Needs:   . Lack of Transportation (Medical): Not on file  . Lack of Transportation (Non-Medical): Not on file  Physical Activity:   . Days of Exercise per Week: Not on file  . Minutes of Exercise per Session: Not on file  Stress:   . Feeling of Stress : Not on file  Social Connections:   . Frequency of Communication with Friends and Family: Not on file  . Frequency of Social Gatherings with Friends and Family: Not on file  . Attends Religious Services: Not on file  . Active Member of Clubs or Organizations: Not on file  . Attends Archivist Meetings: Not on file  . Marital Status: Not on file  Intimate Partner Violence:   . Fear of Current or Ex-Partner: Not on file  . Emotionally Abused: Not on file  . Physically Abused: Not on file  . Sexually Abused: Not on file    Current Outpatient Medications on File Prior to Visit  Medication Sig Dispense Refill  . Accu-Chek Softclix Lancets lancets Use to monitor glucose levels 4 times per day; E11.9 200 each 2  . albuterol (PROAIR HFA) 108 (90 BASE) MCG/ACT inhaler Inhale 2 puffs into the lungs every 6 (six) hours as needed for wheezing or shortness of breath.     Marland Kitchen albuterol (PROVENTIL) (2.5 MG/3ML) 0.083% nebulizer solution Take 2.5 mg by nebulization every 6 (six) hours as needed for wheezing.    Marland Kitchen ALPRAZolam (XANAX) 0.5 MG tablet Take 0.5 mg 2 (two) times daily as needed by mouth for anxiety or sleep.     Marland Kitchen amLODipine (NORVASC) 5 MG tablet Take 1 tablet (5 mg total) by mouth daily. 90 tablet 3  . aspirin EC 81 MG tablet Take 81 mg by mouth daily.    Marland Kitchen azaTHIOprine (IMURAN) 50 MG tablet Take 100 mg by mouth 2 (two) times daily.      . B-D UF III MINI PEN NEEDLES 31G X 5 MM MISC USE AS DIRECTED TWICE A DAY 100 each 2  . benzonatate (TESSALON) 100 MG capsule Take by mouth continuous as needed for cough.    Marland Kitchen BIDIL 20-37.5 MG tablet TAKE TWO (2) TABLETS BY MOUTH THREE TIMES DAILY  180 tablet 3  . Blood Glucose Monitoring Suppl (ACCU-CHEK AVIVA) device Use as instructed 1 each 0  . BUPROPION HCL PO Take 300 mg  by mouth every morning.     . carvedilol (COREG) 12.5 MG tablet Take 12.5 mg by mouth 2 (two) times daily with a meal.     . cetirizine (ZYRTEC) 10 MG tablet Take 10 mg by mouth daily as needed for allergies.     Marland Kitchen clopidogrel (PLAVIX) 75 MG tablet Take 75 mg by mouth daily.    . clotrimazole-betamethasone (LOTRISONE) cream Apply 1 application topically 2 (two) times daily. (Patient taking differently: Apply 1 application topically as needed. ) 45 g 3  . cyanocobalamin (,VITAMIN B-12,) 1000 MCG/ML injection Inject into the muscle. Every other month    . cycloSPORINE (RESTASIS) 0.05 % ophthalmic emulsion Place 1 drop into both eyes 3 (three) times daily.    . diclofenac sodium (VOLTAREN) 1 % GEL Apply 2-4 g 4 (four) times daily as needed topically (joint pain).    Marland Kitchen doxepin (SINEQUAN) 25 MG capsule Take 25 mg by mouth daily.     . DULoxetine (CYMBALTA) 60 MG capsule Take 60 mg by mouth at bedtime.    Marland Kitchen ENTRESTO 97-103 MG TAKE 1 TABLET BY MOUTH TWICE A DAY 60 tablet 2  . etonogestrel-ethinyl estradiol (NUVARING) 0.12-0.015 MG/24HR vaginal ring Place 1 each every 28 (twenty-eight) days vaginally. Insert vaginally and leave in place for 3 consecutive weeks, then remove for 1 week.    Marland Kitchen exenatide (BYETTA) 10 MCG/0.04ML SOPN injection Inject 0.04 mLs (10 mcg total) into the skin 2 (two) times daily with a meal. 2.4 mL 0  . famotidine (PEPCID) 20 MG tablet Take 20 mg by mouth 2 (two) times daily.    . fluticasone (FLONASE) 50 MCG/ACT nasal spray Place 2 sprays into both nostrils continuous as needed for allergies or rhinitis.     Marland Kitchen glucose blood (ACCU-CHEK AVIVA PLUS) test strip Use to check blood sugar 4 times daily. **Needs appt for refills** 150 each 0  . hydroxychloroquine (PLAQUENIL) 200 MG tablet Take 200 mg by mouth 2 (two) times daily.     . Insulin Pen Needle (EASY TOUCH PEN NEEDLES) 32G X 4 MM MISC 1 each by Other route 2 (two) times daily. E11.9 100 each 10  . Insulin Syringe-Needle U-100 (EASY TOUCH INSULIN SYRINGE) 31G X 5/16" 1 ML MISC USE AS DIRECTED THREE TIMES A DAY 100 each 10  . megestrol (MEGACE) 40 MG tablet Take 40 mg by mouth daily.  3  . metFORMIN (GLUCOPHAGE-XR) 500 MG 24 hr tablet Take 1,000 mg by mouth 2 (two) times daily.    . mometasone-formoterol (DULERA) 100-5 MCG/ACT AERO Inhale 2 puffs into the lungs 2 (two) times daily.    Marland Kitchen oxybutynin (DITROPAN-XL) 10 MG 24 hr tablet Take 15 mg by mouth daily.     . Polyvinyl Alcohol-Povidone (REFRESH OP) Place 1 drop 2 (two) times daily into both eyes.     . pravastatin (PRAVACHOL) 10 MG tablet Take 10 mg by mouth daily.    . predniSONE (DELTASONE) 1 MG tablet Take 3 mg by mouth daily.     . pregabalin (LYRICA) 75 MG capsule Take 75 mg 3 (three) times daily by mouth.     . psyllium (METAMUCIL MULTIHEALTH FIBER) 58.6 % powder Take 1 packet by mouth 2 (two) times daily.    Marland Kitchen spironolactone (ALDACTONE) 50 MG tablet TAKE 1 TABLET BY MOUTH EVERY DAY 30 tablet 3  . topiramate (TOPAMAX) 25 MG tablet Take 75 mg by mouth at bedtime.    . torsemide (DEMADEX) 20 MG tablet 40 mg in AM  and 20 mg in PM (Patient taking differently: Take 20 mg by mouth 2 (two) times daily. ) 30 tablet 0  . traMADol (ULTRAM) 50 MG tablet TAKE 1 TO 2 TABLETS BY MOUTH EVERY 6 HOURS AS NEEDED FOR PAIN**PA REQ** 40 tablet 0   No current facility-administered medications on file prior to visit.    Allergies  Allergen Reactions  . Metoclopramide Other (See Comments)    Developed restless leg, akathisia type limb movements.   . Codeine Itching, Rash and Other (See Comments)  .  Penicillins Itching    Has patient had a PCN reaction causing immediate rash, facial/tongue/throat swelling, SOB or lightheadedness with hypotension: No Has patient had a PCN reaction causing severe rash involving mucus membranes or skin necrosis: No Has patient had a PCN reaction that required hospitalization: No Has patient had a PCN reaction occurring within the last 10 years:yes If all of the above answers are "NO", then may proceed with Cephalosporin use.  Marland Kitchen Hydrocodone Rash  . Percocet [Oxycodone-Acetaminophen] Hives and Rash    Tolerates dilaudid    Family History  Problem Relation Age of Onset  . Diabetes Mother   . Asthma Mother   . Urticaria Mother   . Hypertension Mother   . Breast cancer Maternal Aunt 45       currently 53  . Stomach cancer Maternal Uncle        dx 47s; deceased  . Prostate cancer Maternal Uncle        deceased 34s  . Cancer Maternal Aunt        unk. primary  . Cancer Maternal Aunt        unk. primary  . Ovarian cancer Maternal Aunt        deceased 52s  . Hypertension Other   . Stroke Other   . Arthritis Other   . Ovarian cancer Sister 75       maternal half-sister; RAD51C positive  . Cancer Paternal Aunt        unk. primary; deceased 32s  . Prostate cancer Paternal Uncle        deceased 63s  . Breast cancer Cousin        deceased 2s; daughter of mat uncle with stomach ca  . Breast cancer Maternal Aunt        deceased 6s  . Asthma Brother   . Asthma Brother   . Allergic rhinitis Neg Hx   . Angioedema Neg Hx     BP 110/78 (BP Location: Left Wrist, Patient Position: Sitting, Cuff Size: Large)   Pulse (!) 115   Ht 5' 8"  (1.727 m)   Wt (!) 426 lb (193.2 kg)   LMP 03/24/2017   SpO2 90%   BMI 64.77 kg/m   Review of Systems She denies hypoglycemia    Objective:   Physical Exam VITAL SIGNS:  See vs page GENERAL: no distress Pulses: dorsalis pedis intact bilat.   MSK: no deformity of the feet CV: 1+ bilat leg edema Skin:  no  ulcer on the feet, but the skin is dry.  normal color and temp on the feet.   Neuro: sensation is intact to touch on the feet.    Lab Results  Component Value Date   CREATININE 0.90 11/01/2018   BUN 8 11/01/2018   NA 140 11/01/2018   K 4.2 11/01/2018   CL 103 11/01/2018   CO2 22 11/01/2018    Lab Results  Component Value Date   HGBA1C 9.4 (A)  07/18/2019      Assessment & Plan:  Insulin-requiring type 2 DM, with PN: she needs increased rx.  She declines to change to 70/30 now  Patient Instructions  check your blood sugar 4 times a day: before the 3 meals, and at bedtime.  also check if you have symptoms of your blood sugar being too high or too low.  please keep a record of the readings and bring it to your next appointment here (or you can bring the meter itself).  You can write it on any piece of paper.  please call us sooner if your blood sugar goes below 70, or if you have a lot of readings over 200, especially if you have to increase the prednisone again.   Please continue the same medications for now.  On this type of insulin schedule, you should eat meals on a regular schedule (especially lunch).  If a meal is missed or significantly delayed, your blood sugar could go low.   Please come back for a follow-up appointment in 2 months.

## 2019-07-18 NOTE — Patient Instructions (Addendum)
check your blood sugar 4 times a day: before the 3 meals, and at bedtime.  also check if you have symptoms of your blood sugar being too high or too low.  please keep a record of the readings and bring it to your next appointment here (or you can bring the meter itself).  You can write it on any piece of paper.  please call us sooner if your blood sugar goes below 70, or if you have a lot of readings over 200, especially if you have to increase the prednisone again.   Please continue the same medications for now.  On this type of insulin schedule, you should eat meals on a regular schedule (especially lunch).  If a meal is missed or significantly delayed, your blood sugar could go low.   Please come back for a follow-up appointment in 2 months.

## 2019-07-29 ENCOUNTER — Ambulatory Visit: Payer: Medicaid Other | Admitting: Cardiology

## 2019-08-06 ENCOUNTER — Other Ambulatory Visit: Payer: Self-pay | Admitting: Endocrinology

## 2019-08-06 ENCOUNTER — Other Ambulatory Visit: Payer: Self-pay | Admitting: Cardiology

## 2019-08-06 DIAGNOSIS — E1143 Type 2 diabetes mellitus with diabetic autonomic (poly)neuropathy: Secondary | ICD-10-CM

## 2019-08-06 DIAGNOSIS — Z794 Long term (current) use of insulin: Secondary | ICD-10-CM

## 2019-08-12 ENCOUNTER — Emergency Department (HOSPITAL_COMMUNITY): Payer: Medicaid Other

## 2019-08-12 ENCOUNTER — Inpatient Hospital Stay (HOSPITAL_COMMUNITY)
Admission: EM | Admit: 2019-08-12 | Discharge: 2019-08-19 | DRG: 441 | Disposition: A | Discharge status: Other Acute Inpatient Hospital | Payer: Medicaid Other | Attending: Family Medicine | Admitting: Family Medicine

## 2019-08-12 ENCOUNTER — Encounter (HOSPITAL_COMMUNITY): Payer: Self-pay | Admitting: Emergency Medicine

## 2019-08-12 DIAGNOSIS — N170 Acute kidney failure with tubular necrosis: Secondary | ICD-10-CM | POA: Diagnosis present

## 2019-08-12 DIAGNOSIS — R34 Anuria and oliguria: Secondary | ICD-10-CM | POA: Diagnosis present

## 2019-08-12 DIAGNOSIS — Z794 Long term (current) use of insulin: Secondary | ICD-10-CM

## 2019-08-12 DIAGNOSIS — R0602 Shortness of breath: Secondary | ICD-10-CM

## 2019-08-12 DIAGNOSIS — Z20822 Contact with and (suspected) exposure to covid-19: Secondary | ICD-10-CM | POA: Diagnosis present

## 2019-08-12 DIAGNOSIS — Z7902 Long term (current) use of antithrombotics/antiplatelets: Secondary | ICD-10-CM | POA: Diagnosis not present

## 2019-08-12 DIAGNOSIS — E0810 Diabetes mellitus due to underlying condition with ketoacidosis without coma: Secondary | ICD-10-CM | POA: Diagnosis not present

## 2019-08-12 DIAGNOSIS — E1110 Type 2 diabetes mellitus with ketoacidosis without coma: Secondary | ICD-10-CM

## 2019-08-12 DIAGNOSIS — M3500 Sicca syndrome, unspecified: Secondary | ICD-10-CM | POA: Diagnosis present

## 2019-08-12 DIAGNOSIS — E1169 Type 2 diabetes mellitus with other specified complication: Secondary | ICD-10-CM

## 2019-08-12 DIAGNOSIS — N179 Acute kidney failure, unspecified: Secondary | ICD-10-CM | POA: Diagnosis not present

## 2019-08-12 DIAGNOSIS — Z803 Family history of malignant neoplasm of breast: Secondary | ICD-10-CM

## 2019-08-12 DIAGNOSIS — E785 Hyperlipidemia, unspecified: Secondary | ICD-10-CM | POA: Diagnosis present

## 2019-08-12 DIAGNOSIS — Z825 Family history of asthma and other chronic lower respiratory diseases: Secondary | ICD-10-CM

## 2019-08-12 DIAGNOSIS — Z885 Allergy status to narcotic agent status: Secondary | ICD-10-CM

## 2019-08-12 DIAGNOSIS — Z7982 Long term (current) use of aspirin: Secondary | ICD-10-CM

## 2019-08-12 DIAGNOSIS — I5022 Chronic systolic (congestive) heart failure: Secondary | ICD-10-CM | POA: Diagnosis present

## 2019-08-12 DIAGNOSIS — K7200 Acute and subacute hepatic failure without coma: Secondary | ICD-10-CM | POA: Diagnosis present

## 2019-08-12 DIAGNOSIS — N141 Nephropathy induced by other drugs, medicaments and biological substances: Secondary | ICD-10-CM | POA: Diagnosis present

## 2019-08-12 DIAGNOSIS — Z8041 Family history of malignant neoplasm of ovary: Secondary | ICD-10-CM

## 2019-08-12 DIAGNOSIS — G4733 Obstructive sleep apnea (adult) (pediatric): Secondary | ICD-10-CM | POA: Diagnosis present

## 2019-08-12 DIAGNOSIS — D134 Benign neoplasm of liver: Principal | ICD-10-CM

## 2019-08-12 DIAGNOSIS — I110 Hypertensive heart disease with heart failure: Secondary | ICD-10-CM | POA: Diagnosis present

## 2019-08-12 DIAGNOSIS — D62 Acute posthemorrhagic anemia: Secondary | ICD-10-CM

## 2019-08-12 DIAGNOSIS — E1143 Type 2 diabetes mellitus with diabetic autonomic (poly)neuropathy: Secondary | ICD-10-CM | POA: Diagnosis present

## 2019-08-12 DIAGNOSIS — I152 Hypertension secondary to endocrine disorders: Secondary | ICD-10-CM | POA: Diagnosis present

## 2019-08-12 DIAGNOSIS — E1159 Type 2 diabetes mellitus with other circulatory complications: Secondary | ICD-10-CM | POA: Diagnosis present

## 2019-08-12 DIAGNOSIS — I5042 Chronic combined systolic (congestive) and diastolic (congestive) heart failure: Secondary | ICD-10-CM | POA: Diagnosis present

## 2019-08-12 DIAGNOSIS — F329 Major depressive disorder, single episode, unspecified: Secondary | ICD-10-CM | POA: Diagnosis present

## 2019-08-12 DIAGNOSIS — R1013 Epigastric pain: Secondary | ICD-10-CM | POA: Diagnosis present

## 2019-08-12 DIAGNOSIS — I5023 Acute on chronic systolic (congestive) heart failure: Secondary | ICD-10-CM | POA: Diagnosis present

## 2019-08-12 DIAGNOSIS — B951 Streptococcus, group B, as the cause of diseases classified elsewhere: Secondary | ICD-10-CM | POA: Diagnosis present

## 2019-08-12 DIAGNOSIS — J9611 Chronic respiratory failure with hypoxia: Secondary | ICD-10-CM | POA: Diagnosis present

## 2019-08-12 DIAGNOSIS — Z8673 Personal history of transient ischemic attack (TIA), and cerebral infarction without residual deficits: Secondary | ICD-10-CM

## 2019-08-12 DIAGNOSIS — T508X5A Adverse effect of diagnostic agents, initial encounter: Secondary | ICD-10-CM | POA: Diagnosis present

## 2019-08-12 DIAGNOSIS — Z880 Allergy status to penicillin: Secondary | ICD-10-CM

## 2019-08-12 DIAGNOSIS — R160 Hepatomegaly, not elsewhere classified: Secondary | ICD-10-CM

## 2019-08-12 DIAGNOSIS — D696 Thrombocytopenia, unspecified: Secondary | ICD-10-CM | POA: Diagnosis present

## 2019-08-12 DIAGNOSIS — Z8249 Family history of ischemic heart disease and other diseases of the circulatory system: Secondary | ICD-10-CM

## 2019-08-12 DIAGNOSIS — K661 Hemoperitoneum: Secondary | ICD-10-CM

## 2019-08-12 DIAGNOSIS — Z79899 Other long term (current) drug therapy: Secondary | ICD-10-CM

## 2019-08-12 DIAGNOSIS — M329 Systemic lupus erythematosus, unspecified: Secondary | ICD-10-CM | POA: Diagnosis present

## 2019-08-12 DIAGNOSIS — Z6841 Body Mass Index (BMI) 40.0 and over, adult: Secondary | ICD-10-CM

## 2019-08-12 DIAGNOSIS — I10 Essential (primary) hypertension: Secondary | ICD-10-CM | POA: Diagnosis present

## 2019-08-12 DIAGNOSIS — Z800 Family history of malignant neoplasm of digestive organs: Secondary | ICD-10-CM

## 2019-08-12 DIAGNOSIS — K3184 Gastroparesis: Secondary | ICD-10-CM | POA: Diagnosis present

## 2019-08-12 DIAGNOSIS — E1310 Other specified diabetes mellitus with ketoacidosis without coma: Secondary | ICD-10-CM | POA: Diagnosis not present

## 2019-08-12 DIAGNOSIS — Z7952 Long term (current) use of systemic steroids: Secondary | ICD-10-CM

## 2019-08-12 DIAGNOSIS — Z833 Family history of diabetes mellitus: Secondary | ICD-10-CM

## 2019-08-12 DIAGNOSIS — Z87891 Personal history of nicotine dependence: Secondary | ICD-10-CM

## 2019-08-12 DIAGNOSIS — E119 Type 2 diabetes mellitus without complications: Secondary | ICD-10-CM

## 2019-08-12 LAB — CBC
HCT: 35.6 % — ABNORMAL LOW (ref 36.0–46.0)
Hemoglobin: 10.9 g/dL — ABNORMAL LOW (ref 12.0–15.0)
MCH: 28.4 pg (ref 26.0–34.0)
MCHC: 30.6 g/dL (ref 30.0–36.0)
MCV: 92.7 fL (ref 80.0–100.0)
Platelets: 271 10*3/uL (ref 150–400)
RBC: 3.84 MIL/uL — ABNORMAL LOW (ref 3.87–5.11)
RDW: 13.9 % (ref 11.5–15.5)
WBC: 12.5 10*3/uL — ABNORMAL HIGH (ref 4.0–10.5)
nRBC: 0.2 % (ref 0.0–0.2)

## 2019-08-12 LAB — POCT I-STAT EG7
Acid-base deficit: 10 mmol/L — ABNORMAL HIGH (ref 0.0–2.0)
Bicarbonate: 16.1 mmol/L — ABNORMAL LOW (ref 20.0–28.0)
Calcium, Ion: 1.19 mmol/L (ref 1.15–1.40)
HCT: 35 % — ABNORMAL LOW (ref 36.0–46.0)
Hemoglobin: 11.9 g/dL — ABNORMAL LOW (ref 12.0–15.0)
O2 Saturation: 91 %
Potassium: 4.8 mmol/L (ref 3.5–5.1)
Sodium: 137 mmol/L (ref 135–145)
TCO2: 17 mmol/L — ABNORMAL LOW (ref 22–32)
pCO2, Ven: 36.3 mmHg — ABNORMAL LOW (ref 44.0–60.0)
pH, Ven: 7.255 (ref 7.250–7.430)
pO2, Ven: 70 mmHg — ABNORMAL HIGH (ref 32.0–45.0)

## 2019-08-12 LAB — COMPREHENSIVE METABOLIC PANEL
ALT: 160 U/L — ABNORMAL HIGH (ref 0–44)
AST: 260 U/L — ABNORMAL HIGH (ref 15–41)
Albumin: 3.1 g/dL — ABNORMAL LOW (ref 3.5–5.0)
Alkaline Phosphatase: 76 U/L (ref 38–126)
Anion gap: 16 — ABNORMAL HIGH (ref 5–15)
BUN: 10 mg/dL (ref 6–20)
CO2: 19 mmol/L — ABNORMAL LOW (ref 22–32)
Calcium: 9.4 mg/dL (ref 8.9–10.3)
Chloride: 102 mmol/L (ref 98–111)
Creatinine, Ser: 1.28 mg/dL — ABNORMAL HIGH (ref 0.44–1.00)
GFR calc Af Amer: 57 mL/min — ABNORMAL LOW (ref >60)
GFR calc non Af Amer: 49 mL/min — ABNORMAL LOW (ref >60)
Glucose, Bld: 454 mg/dL — ABNORMAL HIGH (ref 70–99)
Potassium: 3.7 mmol/L (ref 3.5–5.1)
Sodium: 137 mmol/L (ref 135–145)
Total Bilirubin: 0.4 mg/dL (ref 0.3–1.2)
Total Protein: 6.6 g/dL (ref 6.5–8.1)

## 2019-08-12 LAB — I-STAT BETA HCG BLOOD, ED (MC, WL, AP ONLY): I-stat hCG, quantitative: <5 m[IU]/mL (ref <5)

## 2019-08-12 LAB — URINALYSIS, ROUTINE W REFLEX MICROSCOPIC
Bacteria, UA: NONE SEEN
Bilirubin Urine: NEGATIVE
Glucose, UA: >=500 mg/dL — AB
Ketones, ur: 5 mg/dL — AB
Leukocytes,Ua: NEGATIVE
Nitrite: NEGATIVE
Protein, ur: 100 mg/dL — AB
RBC / HPF: >50 RBC/hpf — ABNORMAL HIGH (ref 0–5)
Specific Gravity, Urine: >1.046 — ABNORMAL HIGH (ref 1.005–1.030)
pH: 6 (ref 5.0–8.0)

## 2019-08-12 LAB — LIPASE, BLOOD: Lipase: 18 U/L (ref 11–51)

## 2019-08-12 LAB — BRAIN NATRIURETIC PEPTIDE: B Natriuretic Peptide: 91.4 pg/mL (ref 0.0–100.0)

## 2019-08-12 LAB — TROPONIN I (HIGH SENSITIVITY)
Troponin I (High Sensitivity): 25 ng/L — ABNORMAL HIGH (ref <18)
Troponin I (High Sensitivity): 37 ng/L — ABNORMAL HIGH (ref <18)

## 2019-08-12 LAB — CBG MONITORING, ED: Glucose-Capillary: 433 mg/dL — ABNORMAL HIGH (ref 70–99)

## 2019-08-12 LAB — MAGNESIUM: Magnesium: 1.8 mg/dL (ref 1.7–2.4)

## 2019-08-12 LAB — RESPIRATORY PANEL BY RT PCR (FLU A&B, COVID)
Influenza A by PCR: NEGATIVE
Influenza B by PCR: NEGATIVE
SARS Coronavirus 2 by RT PCR: NEGATIVE

## 2019-08-12 MED ORDER — IOHEXOL 350 MG/ML SOLN
100.0000 mL | Freq: Once | INTRAVENOUS | Status: AC | PRN
  Administered 2019-08-12: 20:00:00 100 mL via INTRAVENOUS

## 2019-08-12 MED ORDER — SODIUM CHLORIDE 0.9 % IV SOLN: INTRAVENOUS | Status: DC

## 2019-08-12 MED ORDER — INSULIN ASPART 100 UNIT/ML ~~LOC~~ SOLN
8.0000 [IU] | Freq: Once | SUBCUTANEOUS | Status: AC
  Administered 2019-08-12: 8 [IU] via SUBCUTANEOUS

## 2019-08-12 MED ORDER — LORAZEPAM 2 MG/ML IJ SOLN
1.0000 mg | Freq: Once | INTRAMUSCULAR | Status: AC
  Administered 2019-08-12: 1 mg via INTRAVENOUS
  Filled 2019-08-12: qty 1

## 2019-08-12 MED ORDER — ONDANSETRON HCL 4 MG/2ML IJ SOLN
4.0000 mg | Freq: Once | INTRAMUSCULAR | Status: AC
  Administered 2019-08-12: 17:00:00 4 mg via INTRAVENOUS
  Filled 2019-08-12: qty 2

## 2019-08-12 MED ORDER — SODIUM CHLORIDE 0.9 % IV BOLUS
1000.0000 mL | Freq: Once | INTRAVENOUS | Status: AC
  Administered 2019-08-12: 1000 mL via INTRAVENOUS

## 2019-08-12 MED ORDER — SODIUM CHLORIDE 0.9% FLUSH: 10.0000 mL | INTRAVENOUS | Status: DC | PRN

## 2019-08-12 MED ORDER — HYDROMORPHONE HCL 1 MG/ML IJ SOLN
0.5000 mg | Freq: Once | INTRAMUSCULAR | Status: AC
  Administered 2019-08-12: 19:00:00 0.5 mg via INTRAVENOUS
  Filled 2019-08-12: qty 1

## 2019-08-12 MED ORDER — HYDROMORPHONE HCL 1 MG/ML IJ SOLN
0.5000 mg | Freq: Once | INTRAMUSCULAR | Status: AC
  Administered 2019-08-12: 0.5 mg via INTRAVENOUS
  Filled 2019-08-12: qty 1

## 2019-08-12 MED ORDER — ONDANSETRON HCL 4 MG/2ML IJ SOLN
4.0000 mg | Freq: Once | INTRAMUSCULAR | Status: AC
  Administered 2019-08-12: 23:00:00 4 mg via INTRAVENOUS
  Filled 2019-08-12: qty 2

## 2019-08-12 MED ORDER — SODIUM CHLORIDE 0.9% FLUSH
10.0000 mL | Freq: Two times a day (BID) | INTRAVENOUS | Status: DC
  Administered 2019-08-14: 22:00:00 10 mL
  Administered 2019-08-16: 20 mL
  Administered 2019-08-16 – 2019-08-18 (×6): 10 mL

## 2019-08-12 MED ORDER — DEXTROSE 50 % IV SOLN: 0.0000 mL | INTRAVENOUS | Status: DC | PRN

## 2019-08-12 MED ORDER — ONDANSETRON 4 MG PO TBDP
4.0000 mg | ORAL_TABLET | Freq: Once | ORAL | Status: AC | PRN
  Administered 2019-08-12: 4 mg via ORAL

## 2019-08-12 MED ORDER — POTASSIUM CHLORIDE 10 MEQ/100ML IV SOLN
10.0000 meq | INTRAVENOUS | Status: AC
  Administered 2019-08-13 (×2): 10 meq via INTRAVENOUS
  Filled 2019-08-12 (×2): qty 100

## 2019-08-12 MED ORDER — INSULIN REGULAR(HUMAN) IN NACL 100-0.9 UT/100ML-% IV SOLN
INTRAVENOUS | Status: DC
  Administered 2019-08-13: 01:00:00 17 [IU]/h via INTRAVENOUS
  Administered 2019-08-15: 4.4 [IU]/h via INTRAVENOUS
  Administered 2019-08-16: 3.4 [IU]/h via INTRAVENOUS
  Filled 2019-08-12 (×6): qty 100

## 2019-08-12 MED ORDER — DEXTROSE-NACL 5-0.45 % IV SOLN: INTRAVENOUS | Status: DC

## 2019-08-12 MED ORDER — SODIUM CHLORIDE 0.9% FLUSH
3.0000 mL | Freq: Once | INTRAVENOUS | Status: AC
  Administered 2019-08-12: 3 mL via INTRAVENOUS

## 2019-08-12 MED ORDER — IOHEXOL 350 MG/ML SOLN
60.0000 mL | Freq: Once | INTRAVENOUS | Status: AC | PRN
  Administered 2019-08-12: 20:00:00 60 mL via INTRAVENOUS

## 2019-08-12 NOTE — H&P (Signed)
History and Physical    [name] OHY:[id number] DOB: May 15, 1971 DOA: 08/12/2019  PCP: Bartholome Bill, MD  Patient coming from: home  I have personally briefly reviewed patient's old medical records in [place]  Chief Complaint: Nausea/Vomiting/Abdominal Pain  HPI: [name] is a 49 y.o. female with pmhx of Morbid obesity, Lupus, Sjogren's syndrome, CHF, DM c/b gastroparesis, CVA, HLD, OSA who presents with 2 days of severe nausea/vomiting.  Patient reports onset of symptoms was on 1/10, around 4:30 pm soon after eating beans.  She states within the hour she began to have severe nausea/vomiting, and has continued to suffer from this thru this time.  She denies any fevers, chills, diarrhea.  She reports she has had issues with nausea/vomiting in the past but denies anything of this nature. She denies any dysuria, hematuria.  No constipation/diarrhea.  She has not been able to keep any meds down.  She denies any alcohol use.  On ROS, she separately notes some RUQ pain that she has been dealing with for approximately 2 weeks now.  She reports this pain has been present constantly but she had attributed it to sleeping on her R. Side.  No tobacco, alcohol or drug use   Review of Systems: As per HPI otherwise 10 point review of systems negative.    Past Medical History:  Diagnosis Date  . Acute renal failure (HCC)     Intractable nausea vomiting secondary to diabetic gastroparesis causing dehydration and acute renal failure /notes 04/01/2013  . Anginal pain ([place])   . Anxiety   . Bell's palsy 08/09/2010  . CAP (community acquired pneumonia) 10/2011   Archie Endo 11/08/2011  . Chest pain 01/13/2014  . CHF (congestive heart failure) ([place])   . Diabetic gastroparesis associated with type 2 diabetes mellitus ([place])    this is presumed diagnoses, not confirmed by any studies.   . Eczema   . Family history of malignant neoplasm of breast   . Family history of malignant neoplasm of  ovary   . GERD (gastroesophageal reflux disease)   . High cholesterol   . Hypertension   . Liver mass 2014   biopsied 03/2013 at [facility], not malignant.  is to undergo radiologic ablation of the mass in sept/October 2014.   . Lupus ([place])   . Migraines    "maybe a couple times/yr" (04/01/2013)  . Obesity   . Obstructive sleep apnea on CPAP 2011  . On home oxygen therapy    "2L 24/7" (02/28/2015)  . SJOGREN'S SYNDROME 08/09/2010  . SLE (systemic lupus erythematosus) ([place])    Archie Endo 12/01/2010  . Stroke [facility]) 2010; 10/2012   "left side is still weak from it, never fully regained full strength; no additions from stroke 10/2012"    Past Surgical History:  Procedure Laterality Date  . BREAST CYST EXCISION Left 08/2005   epidermoid  . CARDIAC CATHETERIZATION  02/07/12  . ECTOPIC PREGNANCY SURGERY  1999  . ECTOPIC PREGNANCY SURGERY  1999  . ESOPHAGOGASTRODUODENOSCOPY N/A 02/26/2013   Procedure: ESOPHAGOGASTRODUODENOSCOPY (EGD);  Surgeon: Irene Shipper, MD;  Location[facility] ENDOSCOPY;  Service: Endoscopy;  Laterality: N/A;  . ESOPHAGOGASTRODUODENOSCOPY N/A 03/02/2015   Procedure: ESOPHAGOGASTRODUODENOSCOPY (EGD);  Surgeon: Milus Banister, MD;  Location: [place];  Service: Endoscopy;  Laterality: N/A;  . HERNIA REPAIR    . LEFT AND RIGHT HEART CATHETERIZATION WITH CORONARY ANGIOGRAM N/A 02/07/2012   Procedure: LEFT AND RIGHT HEART CATHETERIZATION WITH CORONARY ANGIOGRAM;  Surgeon: Laverda Page,  MD;  Location: [place] CATH LAB;  Service: Cardiovascular;  Laterality: N/A;  . LIVER BIOPSY  03/2013   liver mass/medical hx noted above  . MUSCLE BIOPSY     for lupus/notes 12/01/2010  . UMBILICAL HERNIA REPAIR  1980's     reports that she quit smoking about 28 years ago. Her smoking use included cigarettes. She has a 1.50 pack-year smoking history. She has never used smokeless tobacco. She reports that she does not drink alcohol or use drugs.  Allergies  Allergen Reactions  . Metoclopramide Other  (See Comments)    Developed restless leg, akathisia type limb movements.   . Codeine Itching, Rash and Other (See Comments)  . Penicillins Itching    Has patient had a PCN reaction causing immediate rash, facial/tongue/throat swelling, SOB or lightheadedness with hypotension: No Has patient had a PCN reaction causing severe rash involving mucus membranes or skin necrosis: No Has patient had a PCN reaction that required hospitalization: No Has patient had a PCN reaction occurring within the last 10 years:yes If all of the above answers are "NO", then may proceed with Cephalosporin use.  Marland Kitchen Hydrocodone Rash  . Percocet [Oxycodone-Acetaminophen] Hives and Rash    Tolerates dilaudid    Family History  Problem Relation Age of Onset  . Diabetes Mother   . Asthma Mother   . Urticaria Mother   . Hypertension Mother   . Breast cancer Maternal Aunt 61       currently 67  . Stomach cancer Maternal Uncle        dx 26s; deceased  . Prostate cancer Maternal Uncle        deceased 85s  . Cancer Maternal Aunt        unk. primary  . Cancer Maternal Aunt        unk. primary  . Ovarian cancer Maternal Aunt        deceased 85s  . Hypertension Other   . Stroke Other   . Arthritis Other   . Ovarian cancer Sister 73       maternal half-sister; RAD51C positive  . Cancer Paternal Aunt        unk. primary; deceased 16s  . Prostate cancer Paternal Uncle        deceased 5s  . Breast cancer Cousin        deceased 9s; daughter of mat uncle with stomach ca  . Breast cancer Maternal Aunt        deceased 67s  . Asthma Brother   . Asthma Brother   . Allergic rhinitis Neg Hx   . Angioedema Neg Hx     Prior to Admission medications   Medication Sig Start Date End Date Taking? Authorizing Provider  Accu-Chek Softclix Lancets lancets Use to monitor glucose levels 4 times per day; E11.9 10/19/18   Renato Shin, MD  albuterol (PROAIR HFA) 108 (90 BASE) MCG/ACT inhaler Inhale 2 puffs into the lungs  every 6 (six) hours as needed for wheezing or shortness of breath.     [provider]  albuterol (PROVENTIL) (2.5 MG/3ML) 0.083% nebulizer solution Take 2.5 mg by nebulization every 6 (six) hours as needed for wheezing.    [provider]  ALPRAZolam Duanne Moron) 0.5 MG tablet Take 0.5 mg 2 (two) times daily as needed by mouth for anxiety or sleep.  04/19/17   [provider]  amLODipine (NORVASC) 5 MG tablet Take 1 tablet (5 mg total) by mouth daily. 10/19/18   Miquel Dunn,  NP  aspirin EC 81 MG tablet Take 81 mg by mouth daily.    [provider]  azaTHIOprine (IMURAN) 50 MG tablet Take 100 mg by mouth 2 (two) times daily.     [provider]  B-D UF III MINI PEN NEEDLES 31G X 5 MM MISC USE AS DIRECTED TWICE A DAY 10/31/18   Renato Shin, MD  benzonatate (TESSALON) 100 MG capsule Take by mouth continuous as needed for cough.    [provider]  BIDIL 20-37.5 MG tablet TAKE TWO (2) TABLETS BY MOUTH THREE TIMES DAILY  04/03/19   Miquel Dunn, NP  Blood Glucose Monitoring Suppl (ACCU-CHEK AVIVA) device Use as instructed 10/25/18 10/25/19  Philemon Kingdom, MD  BUPROPION HCL PO Take 300 mg by mouth every morning.  10/20/16   [provider]  BYETTA 10 MCG PEN 10 MCG/0.04ML SOPN injection INJECT 0.04ML UNDER THE SKIN 2 TIMES DAILY WITH A MEAL 08/07/19   Renato Shin, MD  carvedilol (COREG) 12.5 MG tablet Take 12.5 mg by mouth 2 (two) times daily with a meal.     [provider]  cetirizine (ZYRTEC) 10 MG tablet Take 10 mg by mouth daily as needed for allergies.     [provider]  clopidogrel (PLAVIX) 75 MG tablet Take 75 mg by mouth daily.    [provider]  clotrimazole-betamethasone (LOTRISONE) cream Apply 1 application topically 2 (two) times daily. Patient taking differently: Apply 1 application topically as needed.  01/01/19   Renato Shin, MD  cyanocobalamin (,VITAMIN B-12,) 1000 MCG/ML injection  Inject into the muscle. Every other month 12/17/18   [provider]  cycloSPORINE (RESTASIS) 0.05 % ophthalmic emulsion Place 1 drop into both eyes 3 (three) times daily.    [provider]  diclofenac sodium (VOLTAREN) 1 % GEL Apply 2-4 g 4 (four) times daily as needed topically (joint pain). 06/06/17   Geradine Girt, DO  doxepin (SINEQUAN) 25 MG capsule Take 25 mg by mouth daily.     [provider]  DULoxetine (CYMBALTA) 60 MG capsule Take 60 mg by mouth at bedtime.    [provider]  EASY TOUCH PEN NEEDLES 32G X 4 MM MISC USE TO INJECT INSULIN TWICE DAILY 08/07/19   Renato Shin, MD  ENTRESTO 97-103 MG TAKE 1 TABLET BY MOUTH TWICE A DAY 06/04/19   Adrian Prows, MD  etonogestrel-ethinyl estradiol (NUVARING) 0.12-0.015 MG/24HR vaginal ring Place 1 each every 28 (twenty-eight) days vaginally. Insert vaginally and leave in place for 3 consecutive weeks, then remove for 1 week.    [provider]  famotidine (PEPCID) 20 MG tablet Take 20 mg by mouth 2 (two) times daily.    [provider]  fluticasone (FLONASE) 50 MCG/ACT nasal spray Place 2 sprays into both nostrils continuous as needed for allergies or rhinitis.    [provider]  glucose blood (ACCU-CHEK AVIVA PLUS) test strip Use to check blood sugar 4 times daily. **Needs appt for refills** 07/09/19   Elayne Snare, MD  hydroxychloroquine (PLAQUENIL) 200 MG tablet Take 200 mg by mouth 2 (two) times daily.     [provider]  insulin NPH Human (HUMULIN N) 100 UNIT/ML injection Inject 2 mLs (200 Units total) into the skin every morning. And syringes 3/day 07/18/19   Renato Shin, MD  Insulin Syringe-Needle U-100 (EASY TOUCH INSULIN SYRINGE) 31G X 5/16" 1 ML MISC USE AS DIRECTED THREE TIMES A DAY 10/12/18   Renato Shin, MD  megestrol (  MEGACE) 40 MG tablet Take 40 mg by mouth daily. 01/30/15   [provider]  metFORMIN (GLUCOPHAGE-XR) 500 MG 24 hr tablet Take 1,000 mg by  mouth 2 (two) times daily.    [provider]  mometasone-formoterol (DULERA) 100-5 MCG/ACT AERO Inhale 2 puffs into the lungs 2 (two) times daily. 05/29/17   [provider]  oxybutynin (DITROPAN-XL) 10 MG 24 hr tablet Take 15 mg by mouth daily.  08/30/17   [provider]  Polyvinyl Alcohol-Povidone (REFRESH OP) Place 1 drop 2 (two) times daily into both eyes.     [provider]  pravastatin (PRAVACHOL) 10 MG tablet Take 10 mg by mouth daily.    [provider]  predniSONE (DELTASONE) 1 MG tablet Take 3 mg by mouth daily.  04/21/17   [provider]  pregabalin (LYRICA) 75 MG capsule Take 75 mg 3 (three) times daily by mouth.  01/28/15   [provider]  psyllium (METAMUCIL MULTIHEALTH FIBER) 58.6 % powder Take 1 packet by mouth 2 (two) times daily.    [provider]  spironolactone (ALDACTONE) 50 MG tablet TAKE 1 TABLET BY MOUTH EVERY DAY 04/09/19   Miquel Dunn, NP  topiramate (TOPAMAX) 25 MG tablet Take 75 mg by mouth at bedtime. 03/17/16   [provider]  torsemide (DEMADEX) 20 MG tablet 40 mg in AM and 20 mg in PM Patient taking differently: Take 20 mg by mouth 2 (two) times daily.  09/23/17   Debbe Odea, MD  traMADol (ULTRAM) 50 MG tablet TAKE 1 TO 2 TABLETS BY MOUTH EVERY 6 HOURS AS NEEDED FOR PAIN**PA REQ** Patient taking differently: Take 50-100 mg by mouth every 6 (six) hours as needed (pain).  01/21/19   Pete Pelt, PA-C    Physical Exam: Vitals:   08/12/19 2033 08/12/19 2034 08/12/19 2045 08/12/19 2100  BP:   (!) 137/104 (!) 128/98  Pulse: (!) 116 (!) 117 (!) 116 (!) 122  Resp: (!) 23 (!) 21 (!) 25 (!) 23  Temp:      TempSrc:      SpO2: 100% 99% 100% 100%    Vitals:   08/12/19 2033 08/12/19 2034 08/12/19 2045 08/12/19 2100  BP:   (!) 137/104 (!) 128/98  Pulse: (!) 116 (!) 117 (!) 116 (!) 122  Resp: (!) 23 (!) 21 (!) 25 (!) 23  Temp:      TempSrc:      SpO2: 100% 99% 100% 100%       Constitutional: In acute distress from nausea/vomiting,  Eyes: PERRL, lids and conjunctivae normal ENMT: Mucous membranes are moist. Posterior pharynx clear of any exudate or lesions.Normal dentition.  Neck: normal, supple, no masses, no thyromegaly Respiratory: clear to auscultation bilaterally, no wheezing, no crackles. Normal respiratory effort. No accessory muscle use.  Cardiovascular: Regular rate and rhythm, no murmurs / rubs / gallops. No extremity edema. 2+ pedal pulses. No carotid bruits.  Abdomen: no tenderness, no masses palpated. No hepatosplenomegaly. Bowel sounds positive.  Musculoskeletal: no clubbing / cyanosis. No joint deformity upper and lower extremities. Good ROM, no contractures. Normal muscle tone.  Skin: no rashes, lesions, ulcers. No induration Neurologic: CN 2-12 grossly intact. Sensation intact, DTR normal. Strength 5/5 in all 4.  Psychiatric: Normal judgment and insight. Alert and oriented x 3. Normal mood.    Labs on Admission: I have personally reviewed following labs and imaging studies  CBC: Recent Labs  Lab 08/12/19 1332 08/12/19 1523  WBC 12.5*  --  HGB 10.9* 11.9*  HCT 35.6* 35.0*  MCV 92.7  --   PLT 271  --    Basic Metabolic Panel: Recent Labs  Lab 08/12/19 1332 08/12/19 1506 08/12/19 1523  NA 137  --  137  K 3.7  --  4.8  CL 102  --   --   CO2 19*  --   --   GLUCOSE 454*  --   --   BUN 10  --   --   CREATININE 1.28*  --   --   CALCIUM 9.4  --   --   MG  --  1.8  --    GFR: CrCl cannot be calculated (Unknown ideal weight.). Liver Function Tests: Recent Labs  Lab 08/12/19 1332  AST 260*  ALT 160*  ALKPHOS 76  BILITOT 0.4  PROT 6.6  ALBUMIN 3.1*   Recent Labs  Lab 08/12/19 1332  LIPASE 18   No results for input(s): AMMONIA in the last 168 hours. Coagulation Profile: No results for input(s): INR, PROTIME in the last 168 hours. Cardiac Enzymes: No results for input(s): CKTOTAL, CKMB, CKMBINDEX, TROPONINI in the  last 168 hours. BNP (last 3 results) No results for input(s): PROBNP in the last 8760 hours. HbA1C: No results for input(s): HGBA1C in the last 72 hours. CBG: No results for input(s): GLUCAP in the last 168 hours. Lipid Profile: No results for input(s): CHOL, HDL, LDLCALC, TRIG, CHOLHDL, LDLDIRECT in the last 72 hours. Thyroid Function Tests: No results for input(s): TSH, T4TOTAL, FREET4, T3FREE, THYROIDAB in the last 72 hours. Anemia Panel: No results for input(s): VITAMINB12, FOLATE, FERRITIN, TIBC, IRON, RETICCTPCT in the last 72 hours. Urine analysis:    Component Value Date/Time   COLORURINE YELLOW 05/28/2017 1229   APPEARANCEUR CLEAR 05/28/2017 1229   LABSPEC 1.026 05/28/2017 1229   PHURINE 6.0 05/28/2017 1229   GLUCOSEU >=500 (A) 05/28/2017 1229   HGBUR MODERATE (A) 05/28/2017 1229   BILIRUBINUR NEGATIVE 05/28/2017 [place] 05/28/2017 1229   PROTEINUR NEGATIVE 05/28/2017 1229   UROBILINOGEN 0.2 02/27/2015 1733   NITRITE NEGATIVE 05/28/2017 1229   LEUKOCYTESUR MODERATE (A) 05/28/2017 1229    Radiological Exams on Admission: DG Chest 2 View  Result Date: 08/12/2019 CLINICAL DATA:  49 year old female with history of shortness of breath. EXAM: CHEST - 2 VIEW COMPARISON:  Chest x-ray 03/26/2018. FINDINGS: Lung volumes are low. Bibasilar opacities which may reflect areas of atelectasis and/or consolidation. No definite pleural effusions. No evidence of pulmonary edema. Mild cardiomegaly. Upper mediastinal contours are within normal limits. IMPRESSION: 1. Low lung volumes with bibasilar opacities which may be areas of atelectasis and/or consolidation. Electronically Signed   By: Vinnie Langton M.D.   On: 08/12/2019 16:17   CT Angio Chest PE W and/or Wo Contrast  Result Date: 08/12/2019 CLINICAL DATA:  49 year old female with shortness of breath. Nausea vomiting. Epigastric pain and abnormal abdomen ultrasound earlier today suspicious for hepatic metastatic  disease. History of lupus. EXAM: CT ANGIOGRAPHY CHEST CT ABDOMEN AND PELVIS WITH CONTRAST TECHNIQUE: Multidetector CT imaging of the chest was performed using the standard protocol during bolus administration of intravenous contrast. Multiplanar CT image reconstructions and MIPs were obtained to evaluate the vascular anatomy. Multidetector CT imaging of the abdomen and pelvis was performed using the standard protocol during bolus administration of intravenous contrast. CONTRAST:  160 milliliters OMNIPAQUE IOHEXOL 350 MG/ML SOLN, 64m OMNIPAQUE IOHEXOL 350 MG/ML SOLN COMPARISON:  CTA chest 03/26/2018. CT Abdomen and Pelvis 01/05/2016. FINDINGS: CTA  CHEST FINDINGS Cardiovascular: Late but adequate contrast bolus timing in the pulmonary arterial tree. Superimposed respiratory motion and low lung volumes. There is no central or hilar pulmonary artery filling defect. But the bilateral pulmonary artery branches are not well evaluated despite repeated contrast bolus attempt. Negative thoracic aorta. Mild cardiomegaly is stable to improved since 2019. No pericardial effusion. No calcified coronary artery atherosclerosis is evident. Mediastinum/Nodes: Negative.  No mediastinal lymphadenopathy. Lungs/Pleura: Low lung volumes with patent major airways aside from atelectatic changes. Nonspecific scattered and asymmetric bilateral pulmonary ground-glass opacity with confluent peribronchial opacity in the superior segment of the right lower lobe (series 4, image 53). Other bilateral lower lobe mildly confluent peribronchial opacity more resembles atelectasis. No pleural effusion. Musculoskeletal: No acute or suspicious osseous lesion identified in the chest. Review of the MIP images confirms the above findings. CT ABDOMEN and PELVIS FINDINGS Hepatobiliary: Multiple large at least 6-7 millimeter areas of abnormally decreased and heterogeneous liver enhancement in the right lobe are poorly marginated and masslike. The right lobe  appears mildly expanded compared to 2017. The left lobe may be spared. The gallbladder is grossly normal. There is a small volume of intermediate density free fluid adjacent to the liver compatible with hemoperitoneum. Pancreas: Negative. Spleen: Diminutive and negative. Adrenals/Urinary Tract: Adrenal glands appear normal. No hydronephrosis. Bilateral renal enhancement is symmetric and within normal limits but on delayed images there is minimal contrast excretion from both kidneys. The proximal ureters are decompressed. Diminutive and unremarkable urinary bladder. There is a small 2 millimeter upper pole right renal calculus which is new from 2017. Stomach/Bowel: Largely decompressed and negative large bowel from the splenic flexure distally. There may be mild sigmoid diverticulosis. Redundant proximal transverse colon with mild retained stool. Similar mild retained stool in the right colon. No large bowel inflammation is evident. Terminal ileum appears negative. Normal appendix suspected on coronal image 72. No dilated small bowel. Mildly fluid distended but otherwise negative stomach. No free air. Free fluid with intermediate density mostly around the liver and in both gutters, small volume overall. Vascular/Lymphatic: Arterial phase images of the abdomen and pelvis were included on the chest study. Abdominal aorta, its branches, iliac and proximal femoral arteries appear patent and within normal limits. On these images the portal venous system is not well evaluated. The main portal vein and SMV appear diminutive. Portal vein was also not well visualized on ultrasound earlier today. No lymphadenopathy. Reproductive: Stable and negative allowing for adjacent hemoperitoneum. Other: Intermediate density pelvic free fluid such as due to hemoperitoneum (series 9, image 82). Volume is moderate and both lower quadrants are also affected. Musculoskeletal: Lower lumbar facet degeneration. No destructive or suspicious  osseous lesion identified. Review of the MIP images confirms the above findings. IMPRESSION: 1. Suboptimal CTA chest for PE with no central or hilar pulmonary embolus. If there is strong clinical suspicion a pulmonary V/Q scan would be complementary to evaluate the segmental and distal branches. 2. Highly abnormal liver with multifocal infiltrative mass-like areas of abnormal enhancement in the right lobe and associated small volume of Hemoperitoneum in the abdomen and pelvis. Top differential considerations include hemorrhagic Hepatocellular Carcinoma or Hepatic Adenomas. These were not evident on a 2017 CT Abdomen and Pelvis. 3. Pulmonary atelectasis and additional nonspecific ground-glass opacity. Acute pulmonary infection or aspiration difficult to exclude. No pleural effusion. 4. Little renal contrast excretion on the delayed images, but no obstructive uropathy. Consider ATN or other intrinsic acute renal failure. 5. Cardiomegaly is stable or mildly improved since  2019. Study discussed by telephone with Dr. Dorie Rank on 08/12/2019 at 20:50 . Electronically Signed   By: Genevie Ann M.D.   On: 08/12/2019 20:56   CT ABDOMEN PELVIS W CONTRAST  Result Date: 08/12/2019 CLINICAL DATA:  49 year old female with shortness of breath. Nausea vomiting. Epigastric pain and abnormal abdomen ultrasound earlier today suspicious for hepatic metastatic disease. History of lupus. EXAM: CT ANGIOGRAPHY CHEST CT ABDOMEN AND PELVIS WITH CONTRAST TECHNIQUE: Multidetector CT imaging of the chest was performed using the standard protocol during bolus administration of intravenous contrast. Multiplanar CT image reconstructions and MIPs were obtained to evaluate the vascular anatomy. Multidetector CT imaging of the abdomen and pelvis was performed using the standard protocol during bolus administration of intravenous contrast. CONTRAST:  160 milliliters OMNIPAQUE IOHEXOL 350 MG/ML SOLN, 55m OMNIPAQUE IOHEXOL 350 MG/ML SOLN COMPARISON:  CTA  chest 03/26/2018. CT Abdomen and Pelvis 01/05/2016. FINDINGS: CTA CHEST FINDINGS Cardiovascular: Late but adequate contrast bolus timing in the pulmonary arterial tree. Superimposed respiratory motion and low lung volumes. There is no central or hilar pulmonary artery filling defect. But the bilateral pulmonary artery branches are not well evaluated despite repeated contrast bolus attempt. Negative thoracic aorta. Mild cardiomegaly is stable to improved since 2019. No pericardial effusion. No calcified coronary artery atherosclerosis is evident. Mediastinum/Nodes: Negative.  No mediastinal lymphadenopathy. Lungs/Pleura: Low lung volumes with patent major airways aside from atelectatic changes. Nonspecific scattered and asymmetric bilateral pulmonary ground-glass opacity with confluent peribronchial opacity in the superior segment of the right lower lobe (series 4, image 53). Other bilateral lower lobe mildly confluent peribronchial opacity more resembles atelectasis. No pleural effusion. Musculoskeletal: No acute or suspicious osseous lesion identified in the chest. Review of the MIP images confirms the above findings. CT ABDOMEN and PELVIS FINDINGS Hepatobiliary: Multiple large at least 6-7 millimeter areas of abnormally decreased and heterogeneous liver enhancement in the right lobe are poorly marginated and masslike. The right lobe appears mildly expanded compared to 2017. The left lobe may be spared. The gallbladder is grossly normal. There is a small volume of intermediate density free fluid adjacent to the liver compatible with hemoperitoneum. Pancreas: Negative. Spleen: Diminutive and negative. Adrenals/Urinary Tract: Adrenal glands appear normal. No hydronephrosis. Bilateral renal enhancement is symmetric and within normal limits but on delayed images there is minimal contrast excretion from both kidneys. The proximal ureters are decompressed. Diminutive and unremarkable urinary bladder. There is a small 2  millimeter upper pole right renal calculus which is new from 2017. Stomach/Bowel: Largely decompressed and negative large bowel from the splenic flexure distally. There may be mild sigmoid diverticulosis. Redundant proximal transverse colon with mild retained stool. Similar mild retained stool in the right colon. No large bowel inflammation is evident. Terminal ileum appears negative. Normal appendix suspected on coronal image 72. No dilated small bowel. Mildly fluid distended but otherwise negative stomach. No free air. Free fluid with intermediate density mostly around the liver and in both gutters, small volume overall. Vascular/Lymphatic: Arterial phase images of the abdomen and pelvis were included on the chest study. Abdominal aorta, its branches, iliac and proximal femoral arteries appear patent and within normal limits. On these images the portal venous system is not well evaluated. The main portal vein and SMV appear diminutive. Portal vein was also not well visualized on ultrasound earlier today. No lymphadenopathy. Reproductive: Stable and negative allowing for adjacent hemoperitoneum. Other: Intermediate density pelvic free fluid such as due to hemoperitoneum (series 9, image 82). Volume is moderate and both lower  quadrants are also affected. Musculoskeletal: Lower lumbar facet degeneration. No destructive or suspicious osseous lesion identified. Review of the MIP images confirms the above findings. IMPRESSION: 1. Suboptimal CTA chest for PE with no central or hilar pulmonary embolus. If there is strong clinical suspicion a pulmonary V/Q scan would be complementary to evaluate the segmental and distal branches. 2. Highly abnormal liver with multifocal infiltrative mass-like areas of abnormal enhancement in the right lobe and associated small volume of Hemoperitoneum in the abdomen and pelvis. Top differential considerations include hemorrhagic Hepatocellular Carcinoma or Hepatic Adenomas. These were not  evident on a 2017 CT Abdomen and Pelvis. 3. Pulmonary atelectasis and additional nonspecific ground-glass opacity. Acute pulmonary infection or aspiration difficult to exclude. No pleural effusion. 4. Little renal contrast excretion on the delayed images, but no obstructive uropathy. Consider ATN or other intrinsic acute renal failure. 5. Cardiomegaly is stable or mildly improved since 2019. Study discussed by telephone with Dr. Dorie Rank on 08/12/2019 at 20:50 . Electronically Signed   By: Genevie Ann M.D.   On: 08/12/2019 20:56   US Abdomen Limited RUQ  Result Date: 08/12/2019 CLINICAL DATA:  Epigastric pain. EXAM: ULTRASOUND ABDOMEN LIMITED RIGHT UPPER QUADRANT COMPARISON:  Abdominopelvic CT 01/05/2016. Abdominal ultrasound 02/28/2015. FINDINGS: Gallbladder: No gallstones or wall thickening visualized. No sonographic Murphy sign noted by sonographer. Common bile duct: Diameter: Not visualized.  No biliary dilatation identified. Liver: The liver is very heterogeneous in echotexture. There are probable ill-defined masses within the left lobe. A predominately hypoechoic mass measures approximately 10.2 x 9.1 x 8.5 cm. There is a more echogenic infiltrative mass in the left lobe measuring 6.9 x 5.6 x 8.7 cm. These lesions were not present previously. The portal vein is not well visualized. Other: Small amount of ascites. IMPRESSION: 1. New diffuse heterogeneity of the hepatic parenchyma with ill-defined masses in the left hepatic lobe, suspicious for malignancy. Further evaluation with abdominopelvic CT with contrast recommended. 2. The portal vein and common bile duct are not well visualized. No biliary dilatation identified. 3. The gallbladder appears normal. Electronically Signed   By: Richardean Sale M.D.   On: 08/12/2019 16:17     Assessment/Plan [name] is a 49 y.o. female with pmhx of Morbid obesity, Lupus, Sjogren's syndrome, CHF, DM c/b gastroparesis, CVA, HLD, OSA who presents with 2 days of  severe nausea/vomiting 2/2 DKA  # Nausea/Vomiting suspect 2/2 DKA # T2DM/IDDM - unclear of precipitant, previously was medication compliant, no infectious etiology by hx/ though possibly viral gastroenteritis.  She has hx of gastroparesis and with hx of possible food poisoning, initial work-up was for possible biliary colic.  However, it does appear she did present with DKA and I suspect this is the etiology of her symptoms.   - CTA of chest did not show PE - CT abdomen revealed liver abnormalities discussed below which is likely etiology of her RUQ pain but do not believe is etiology of her 48 hrs of n/v - continue insulin gtt, DKA protocol  # AKI - suspect pre-renal vs ATN in setting of absent PO intake for 2 days, her renal function did worsen, but will continue to hydrate as per DKA protocol, likely severely intravascularly depleted - she does have hx of CHF, with improving EF, nonetheless at present she is dry, will hydrate, hold her diuretics - held Entresto due to ACEi - she received contrast so monitor for CIN over 48-72 hours - monitor I/O - avoid nephrotoxic agents - given worsening, requested  pharmD consult to further adjust doses of meds though at this time she is unable to keep down PO   # Liver masses - seen on CT A/P with possible small amount of hemoperitoneum - hx of liver masses in 2014, FNA from 03/25/2013 - "no cytomorphologic evidence of malignancy" and differential was hepatocellular adenoma vs focal nodular hyperplasia.  Per the patient she had repeat imaging in 6 months and was told it was benign (under Epic PMHx tab there is commentary about ablation, but she denies any hx of hep c or HCC) - interval imaging did not note these findings (I.e. CT from 2017) - do not suspect it is acutely leading to current presentation of N/V - will obtain chronic Hepatitis profile, denies EtOH use, unexpected weight loss - d/w GI and IR, consider biopsy but caution if bleeding is  concern (have held plavix and chemoprophylaxis for this reason)  # CHF - most recent Echo from 05/2018 - EF of 45-50% - she has a mild rise in the HS troponin, no chest pain/pressure (only present after vomiting) - held diuretics - continue carvedilol, isosorbide-hydralazine, held entresto  # Hx of CVA - held plavix due to finding of possible hemoperitoneum - continue pravastatin  # HTN - continue amlodipine, bidil and coreg  # HLD - continue pravastatin  # Lupus # SS - continue plaquenil, steroids, imuran  # GERD - continue carafate  # Depression - continue bupropion  # Morbid Obesity - BMI 20.94 - complicates all aspects of care  # Polypharmacy - at risk of drug-drug interactions, pharmD assisting with renal dosing  DVT prophylaxis: held lovenox at present Code Status: Full Disposition Plan: pending Admission status: SDU   Truddie Hidden MD [facility] Pager [phone]  If 7PM-7AM, please contact night-coverage [email or web address] Password [facility]  08/12/2019, 9:24 PM

## 2019-08-12 NOTE — ED Triage Notes (Signed)
Pt here with c/o n/v and chest wall pain , pt states that has been ongoing for about 12 hrs , pt has not been around anyone with covid

## 2019-08-12 NOTE — ED Provider Notes (Signed)
[facility] EMERGENCY DEPARTMENT Provider Note   CSN: [id number] Arrival date & time: 08/12/19  1311     History No chief complaint on file.   [name] is a 48 y.o. female.  The history is provided by the patient and medical records. No language interpreter was used.  Abdominal Pain Pain location:  Epigastric Pain quality: aching, cramping and sharp   Pain radiates to:  Chest Pain severity:  Severe Onset quality:  Gradual Duration:  2 days Timing:  Constant Progression:  Unchanged Chronicity:  New Context: suspicious food intake (beans from sister)   Context: not medication withdrawal and not previous surgeries   Relieved by:  Nothing Worsened by:  Nothing Ineffective treatments:  None tried Associated symptoms: chest pain, nausea and vomiting   Associated symptoms: no chills, no constipation, no cough, no diarrhea, no dysuria, no fatigue, no fever and no shortness of breath   Risk factors: obesity        Past Medical History:  Diagnosis Date  . Acute renal failure (HCC)     Intractable nausea vomiting secondary to diabetic gastroparesis causing dehydration and acute renal failure /notes 04/01/2013  . Anginal pain (HCC)   . Anxiety   . Bell's palsy 08/09/2010  . CAP (community acquired pneumonia) 10/2011   /notes 11/08/2011  . Chest pain 01/13/2014  . CHF (congestive heart failure) (HCC)   . Diabetic gastroparesis associated with type 2 diabetes mellitus (HCC)    this is presumed diagnoses, not confirmed by any studies.   . Eczema   . Family history of malignant neoplasm of breast   . Family history of malignant neoplasm of ovary   . GERD (gastroesophageal reflux disease)   . High cholesterol   . Hypertension   . Liver mass 2014   biopsied 03/2013 at [facility], not malignant.  is to undergo radiologic ablation of the mass in sept/October 2014.   . Lupus (HCC)   . Migraines    "maybe a couple times/yr" (04/01/2013)  . Obesity   .  Obstructive sleep apnea on CPAP 2011  . On home oxygen therapy    "2L 24/7" (02/28/2015)  . SJOGREN'S SYNDROME 08/09/2010  . SLE (systemic lupus erythematosus) (HCC)    /notes 12/01/2010  . Stroke (HCC) 2010; 10/2012   "left side is still weak from it, never fully regained full strength; no additions from stroke 10/2012"    Patient Active Problem List   Diagnosis Date Noted  . Multifocal pneumonia 09/23/2017  . Acute respiratory failure (HCC) 09/21/2017  . CHF exacerbation (HCC) 09/20/2017  . Costochondritis 06/06/2017  . Chest pain 06/04/2017  . Diabetes (HCC) 09/19/2015  . Radiculopathy of cervical spine   . Severe headache 03/05/2014  . Family history of malignant neoplasm of breast   . Family history of malignant neoplasm of ovary   . Intractable nausea and vomiting 11/11/2013  . Septate uterus 09/08/2013  . Diabetic gastroparesis (HCC) 04/28/2013  . Constipation due to pain medication 04/21/2013  . Odynophagia 04/04/2013  . Liver mass 02/28/2013  . Reflux esophagitis 02/26/2013  . Morbid obesity (HCC) 10/10/2012  . Chronic diastolic CHF (congestive heart failure) (HCC) 10/09/2012  . Chronic respiratory failure with hypoxia (HCC) 10/09/2012  . Clinical polymyositis syndrome (HCC) 09/06/2012  . Mononeuritis of lower limb 01/12/2012  . HTN (hypertension) 11/08/2011  . Systolic CHF, acute on chronic (HCC) 11/08/2011  . HLD (hyperlipidemia) 07/19/2011  . Bell's palsy 08/09/2010  . SLE 08/09/2010  . SJOGREN'S   SYNDROME 08/09/2010    Past Surgical History:  Procedure Laterality Date  . BREAST CYST EXCISION Left 08/2005   epidermoid  . CARDIAC CATHETERIZATION  02/07/12  . ECTOPIC PREGNANCY SURGERY  1999  . ECTOPIC PREGNANCY SURGERY  1999  . ESOPHAGOGASTRODUODENOSCOPY N/A 02/26/2013   Procedure: ESOPHAGOGASTRODUODENOSCOPY (EGD);  Surgeon: Irene Shipper, MD;  Location: [facility] ENDOSCOPY;  Service: Endoscopy;  Laterality: N/A;  . ESOPHAGOGASTRODUODENOSCOPY N/A 03/02/2015   Procedure:  ESOPHAGOGASTRODUODENOSCOPY (EGD);  Surgeon: Milus Banister, MD;  Location: [facility];  Service: Endoscopy;  Laterality: N/A;  . HERNIA REPAIR    . LEFT AND RIGHT HEART CATHETERIZATION WITH CORONARY ANGIOGRAM N/A 02/07/2012   Procedure: LEFT AND RIGHT HEART CATHETERIZATION WITH CORONARY ANGIOGRAM;  Surgeon: Laverda Page, MD;  Location: [facility] CATH LAB;  Service: Cardiovascular;  Laterality: N/A;  . LIVER BIOPSY  03/2013   liver mass/medical hx noted above  . MUSCLE BIOPSY     for lupus/notes 12/01/2010  . UMBILICAL HERNIA REPAIR  1980's     OB History    Gravida  2   Para  1   Term  1   Preterm      AB  1   Living  1     SAB      TAB      Ectopic  1   Multiple      Live Births  1           Family History  Problem Relation Age of Onset  . Diabetes Mother   . Asthma Mother   . Urticaria Mother   . Hypertension Mother   . Breast cancer Maternal Aunt 59       currently 24  . Stomach cancer Maternal Uncle        dx 72s; deceased  . Prostate cancer Maternal Uncle        deceased 63s  . Cancer Maternal Aunt        unk. primary  . Cancer Maternal Aunt        unk. primary  . Ovarian cancer Maternal Aunt        deceased 34s  . Hypertension Other   . Stroke Other   . Arthritis Other   . Ovarian cancer Sister 69       maternal half-sister; RAD51C positive  . Cancer Paternal Aunt        unk. primary; deceased 58s  . Prostate cancer Paternal Uncle        deceased 11s  . Breast cancer Cousin        deceased 88s; daughter of mat uncle with stomach ca  . Breast cancer Maternal Aunt        deceased 98s  . Asthma Brother   . Asthma Brother   . Allergic rhinitis Neg Hx   . Angioedema Neg Hx     Social History   Tobacco Use  . Smoking status: Former Smoker    Packs/day: 0.50    Years: 3.00    Pack years: 1.50    Types: Cigarettes    Quit date: 06/02/1991    Years since quitting: 28.2  . Smokeless tobacco: Never Used  . Tobacco comment: quit 28 yrs ago    Substance Use Topics  . Alcohol use: No    Alcohol/week: 0.0 standard drinks  . Drug use: No    Home Medications Prior to Admission medications   Medication Sig Start Date End Date Taking? Authorizing Provider  Accu-Chek Softclix Lancets lancets  Use to monitor glucose levels 4 times per day; E11.9 10/19/18   Renato Shin, MD  albuterol [facility] HFA) 108 (90 BASE) MCG/ACT inhaler Inhale 2 puffs into the lungs every 6 (six) hours as needed for wheezing or shortness of breath.     [provider]  albuterol (PROVENTIL) (2.5 MG/3ML) 0.083% nebulizer solution Take 2.5 mg by nebulization every 6 (six) hours as needed for wheezing.    [provider]  ALPRAZolam Duanne Moron) 0.5 MG tablet Take 0.5 mg 2 (two) times daily as needed by mouth for anxiety or sleep.  04/19/17   [provider]  amLODipine (NORVASC) 5 MG tablet Take 1 tablet (5 mg total) by mouth daily. 10/19/18   Miquel Dunn, NP  aspirin EC 81 MG tablet Take 81 mg by mouth daily.    [provider]  azaTHIOprine (IMURAN) 50 MG tablet Take 100 mg by mouth 2 (two) times daily.     [provider]  B-D UF III MINI PEN NEEDLES 31G X 5 MM MISC USE AS DIRECTED TWICE A DAY 10/31/18   Renato Shin, MD  benzonatate (TESSALON) 100 MG capsule Take by mouth continuous as needed for cough.    [provider]  BIDIL 20-37.5 MG tablet TAKE TWO (2) TABLETS BY MOUTH THREE TIMES DAILY  04/03/19   Miquel Dunn, NP  Blood Glucose Monitoring Suppl (ACCU-CHEK AVIVA) device Use as instructed 10/25/18 10/25/19  Philemon Kingdom, MD  BUPROPION HCL PO Take 300 mg by mouth every morning.  10/20/16   [provider]  BYETTA 10 MCG PEN 10 MCG/0.04ML SOPN injection INJECT 0.04ML UNDER THE SKIN 2 TIMES DAILY WITH A MEAL 08/07/19   Renato Shin, MD  carvedilol (COREG) 12.5 MG tablet Take 12.5 mg by mouth 2 (two) times daily with a meal.     [provider]  cetirizine (ZYRTEC) 10 MG tablet  Take 10 mg by mouth daily as needed for allergies.     [provider]  clopidogrel (PLAVIX) 75 MG tablet Take 75 mg by mouth daily.    [provider]  clotrimazole-betamethasone (LOTRISONE) cream Apply 1 application topically 2 (two) times daily. Patient taking differently: Apply 1 application topically as needed.  01/01/19   Renato Shin, MD  cyanocobalamin (,VITAMIN B-12,) 1000 MCG/ML injection Inject into the muscle. Every other month 12/17/18   [provider]  cycloSPORINE (RESTASIS) 0.05 % ophthalmic emulsion Place 1 drop into both eyes 3 (three) times daily.    [provider]  diclofenac sodium (VOLTAREN) 1 % GEL Apply 2-4 g 4 (four) times daily as needed topically (joint pain). 06/06/17   Geradine Girt, DO  doxepin (SINEQUAN) 25 MG capsule Take 25 mg by mouth daily.     [provider]  DULoxetine (CYMBALTA) 60 MG capsule Take 60 mg by mouth at bedtime.    [provider]  EASY TOUCH PEN NEEDLES 32G X 4 MM MISC USE TO INJECT INSULIN TWICE DAILY 08/07/19   Renato Shin, MD  ENTRESTO 97-103 MG TAKE 1 TABLET BY MOUTH TWICE A DAY 06/04/19   Adrian Prows, MD  etonogestrel-ethinyl estradiol (NUVARING) 0.12-0.015 MG/24HR vaginal ring Place 1 each every 28 (twenty-eight) days vaginally. Insert vaginally and leave in place for 3 consecutive weeks, then remove for 1 week.    [provider]  famotidine (PEPCID) 20 MG tablet Take 20 mg by mouth 2 (two) times daily.    [provider]  fluticasone (FLONASE) 50 MCG/ACT nasal  spray Place 2 sprays into both nostrils continuous as needed for allergies or rhinitis.    [provider]  glucose blood (ACCU-CHEK AVIVA PLUS) test strip Use to check blood sugar 4 times daily. **Needs appt for refills** 07/09/19   Kumar, Ajay, MD  hydroxychloroquine (PLAQUENIL) 200 MG tablet Take 200 mg by mouth 2 (two) times daily.     [provider]  insulin NPH Human (HUMULIN N) 100 UNIT/ML  injection Inject 2 mLs (200 Units total) into the skin every morning. And syringes 3/day 07/18/19   Ellison, Sean, MD  Insulin Syringe-Needle U-100 (EASY TOUCH INSULIN SYRINGE) 31G X 5/16" 1 ML MISC USE AS DIRECTED THREE TIMES A DAY 10/12/18   Ellison, Sean, MD  megestrol (MEGACE) 40 MG tablet Take 40 mg by mouth daily. 01/30/15   [provider]  metFORMIN (GLUCOPHAGE-XR) 500 MG 24 hr tablet Take 1,000 mg by mouth 2 (two) times daily.    [provider]  mometasone-formoterol (DULERA) 100-5 MCG/ACT AERO Inhale 2 puffs into the lungs 2 (two) times daily. 05/29/17   [provider]  oxybutynin (DITROPAN-XL) 10 MG 24 hr tablet Take 15 mg by mouth daily.  08/30/17   [provider]  Polyvinyl Alcohol-Povidone (REFRESH OP) Place 1 drop 2 (two) times daily into both eyes.     [provider]  pravastatin (PRAVACHOL) 10 MG tablet Take 10 mg by mouth daily.    [provider]  predniSONE (DELTASONE) 1 MG tablet Take 3 mg by mouth daily.  04/21/17   [provider]  pregabalin (LYRICA) 75 MG capsule Take 75 mg 3 (three) times daily by mouth.  01/28/15   [provider]  psyllium (METAMUCIL MULTIHEALTH FIBER) 58.6 % powder Take 1 packet by mouth 2 (two) times daily.    [provider]  spironolactone (ALDACTONE) 50 MG tablet TAKE 1 TABLET BY MOUTH EVERY DAY 04/09/19   Kelley, Ashton Haynes, NP  topiramate (TOPAMAX) 25 MG tablet Take 75 mg by mouth at bedtime. 03/17/16   [provider]  torsemide (DEMADEX) 20 MG tablet 40 mg in AM and 20 mg in PM Patient taking differently: Take 20 mg by mouth 2 (two) times daily.  09/23/17   Rizwan, Saima, MD  traMADol (ULTRAM) 50 MG tablet TAKE 1 TO 2 TABLETS BY MOUTH EVERY 6 HOURS AS NEEDED FOR PAIN**PA REQ** 01/21/19   Clark, Gilbert W, PA-C    Allergies    Metoclopramide, Codeine, Penicillins, Hydrocodone, and Percocet [oxycodone-acetaminophen]  Review of Systems   Review of Systems    Constitutional: Negative for chills, diaphoresis, fatigue and fever.  HENT: Negative for congestion.   Respiratory: Negative for cough, chest tightness, shortness of breath and wheezing.   Cardiovascular: Positive for chest pain. Negative for palpitations and leg swelling.  Gastrointestinal: Positive for abdominal pain, nausea and vomiting. Negative for constipation and diarrhea.  Genitourinary: Negative for dysuria and flank pain.  Musculoskeletal: Negative for back pain, neck pain and neck stiffness.  Neurological: Negative for headaches.  Psychiatric/Behavioral: Negative for agitation.  All other systems reviewed and are negative.   Physical Exam Updated Vital Signs LMP 03/24/2017   Physical Exam Vitals and nursing note reviewed.  Constitutional:      General: She is not in acute distress.    Appearance: She is well-developed. She is ill-appearing. She is not toxic-appearing or diaphoretic.  HENT:     Head: Normocephalic and atraumatic.     Right Ear: External ear normal.       Left Ear: External ear normal.     Nose: Nose normal. No congestion or rhinorrhea.     Mouth/Throat:     Pharynx: No oropharyngeal exudate.  Eyes:     Conjunctiva/sclera: Conjunctivae normal.     Pupils: Pupils are equal, round, and reactive to light.  Cardiovascular:     Rate and Rhythm: Regular rhythm. Tachycardia present.     Pulses: Normal pulses.     Heart sounds: No murmur.  Pulmonary:     Effort: Pulmonary effort is normal. No respiratory distress.     Breath sounds: No stridor. No wheezing, rhonchi or rales.  Chest:     Chest wall: No tenderness.  Abdominal:     General: There is no distension.     Tenderness: There is abdominal tenderness in the right upper quadrant, epigastric area and left upper quadrant. There is no right CVA tenderness, left CVA tenderness, guarding or rebound.    Musculoskeletal:        General: No tenderness.     Cervical back: Normal range of motion and neck  supple.  Skin:    General: Skin is warm.     Findings: No erythema or rash.  Neurological:     Mental Status: She is alert and oriented to person, place, and time.     Motor: No abnormal muscle tone.     Coordination: Coordination normal.     Deep Tendon Reflexes: Reflexes are normal and symmetric.  Psychiatric:        Behavior: Behavior is agitated.     ED Results / Procedures / Treatments   Labs (all labs ordered are listed, but only abnormal results are displayed) Labs Reviewed  COMPREHENSIVE METABOLIC PANEL - Abnormal; Notable for the following components:      Result Value   CO2 19 (*)    Glucose, Bld 454 (*)    Creatinine, Ser 1.28 (*)    Albumin 3.1 (*)    AST 260 (*)    ALT 160 (*)    GFR calc non Af Amer 49 (*)    GFR calc Af Amer 57 (*)    Anion gap 16 (*)    All other components within normal limits  CBC - Abnormal; Notable for the following components:   WBC 12.5 (*)    RBC 3.84 (*)    Hemoglobin 10.9 (*)    HCT 35.6 (*)    All other components within normal limits  URINE CULTURE  RESPIRATORY PANEL BY RT PCR (FLU A&B, COVID)  LIPASE, BLOOD  URINALYSIS, ROUTINE W REFLEX MICROSCOPIC  MAGNESIUM  BRAIN NATRIURETIC PEPTIDE  BLOOD GAS, VENOUS  I-STAT BETA HCG BLOOD, ED ([facility], [facility], [facility] ONLY)  TROPONIN I (HIGH SENSITIVITY)    EKG EKG Interpretation  Date/Time:  Monday August 12 2019 13:43:51 EST Ventricular Rate:  152 PR Interval:  64 QRS Duration: 82 QT Interval:  328 QTC Calculation: 521 R Axis:   21 Text Interpretation: Sinus tachycardia with short PR Cannot rule out Anterior infarct , age undetermined Abnormal ECG When compared to prior, faster rate and longer QTC. ?WPW with short PR. No STEMI Confirmed by , Chris ([id number]) on 08/12/2019 1:58:06 PM   Radiology No results found.  Procedures Procedures (including critical care time)  Medications Ordered in ED Medications  ondansetron (ZOFRAN) injection 4 mg (has no administration in time  range)  sodium chloride 0.9 % bolus 1,000 mL (has no administration in time range)  sodium chloride flush (NS) 0.9 % injection   3 mL (3 mLs Intravenous Given 08/12/19 1509)  ondansetron (ZOFRAN-ODT) disintegrating tablet 4 mg (4 mg Oral Given 08/12/19 1317)  sodium chloride 0.9 % bolus 1,000 mL (1,000 mLs Intravenous New Bag/Given 08/12/19 1510)  LORazepam (ATIVAN) injection 1 mg (1 mg Intravenous Given 08/12/19 1508)  HYDROmorphone (DILAUDID) injection 0.5 mg (0.5 mg Intravenous Given 08/12/19 1509)    ED Course  I have reviewed the triage vital signs and the nursing notes.  Pertinent labs & imaging results that were available during my care of the patient were reviewed by me and considered in my medical decision making (see chart for details).    MDM Rules/Calculators/A&P                      [name] is a 48 y.o. female with a past medical history significant for morbid obesity, Sjogren's syndrome, congestive heart failure, diabetes with gastroparesis, hyperlipidemia, stroke, and lupus who presents with 2 days of severe nausea, vomiting, upper abdominal pain, and chest pain.  Patient reports that her sister gave her beans yesterday and she thinks she got sick because of them.  She is not sure.  She denies fevers or chills but does report some chest discomfort in her central chest.  She reports she has had nonstop vomiting all night and today.  She denies any constipation or diarrhea and had a normal bowel movement earlier.  She denies any urinary symptoms.  Denies trauma.  She reports her pain is 10 out of 10 in the upper abdomen and she reports she still has her gallbladder.  She arrived to the emergency department with a heart rate of 150 with active vomiting.  She appears uncomfortable.  On exam, lungs are clear and chest is nontender.  Abdomen is tender in the epigastrium.  No lower abdominal tenderness.  Bowel sounds were appreciated.  She was placed into a hole bed where there is no  telemetry monitoring on arrival.  EKG shows sinus tachycardia with a short PR.  Chart review shows that she does not have a history WPW but I am concerned about possible delta wave being present.  Also seen is a prolonged QTC.  She was given Zofran ODT in triage, will hold on further QT prolonging agents and will give her Ativan for the continued nausea and vomiting.  We will give her Dilaudid for pain which she reports has helped her in the past and she is allergic to other pain medications by report.  We will give her some fluids as I am concerned about dehydration with her fast heart rate.  Her legs are slightly edematous but she reports this is unchanged.  Her lungs did not sound like rales, doubt CHF exacerbation at this time.  We will give some fluids as I am concerned about her being dehydrated from the vomiting.  Will get chest x-ray and troponin for the chest pain.  Will get right upper quadrant ultrasound for the epigastric and upper abdominal pain with nausea and vomiting.  Anticipate reassessment after rehydration.  We will try to get her into an exam room where she can be monitored on telemetry.  Care transferred to Dr. Knapp while awaiting results of work-up.  Anticipate reassessment after labs have returned and imaging is returned.   Final Clinical Impression(s) / ED Diagnoses Final diagnoses:  Epigastric abdominal pain    Clinical Impression: 1. Epigastric abdominal pain     Disposition: Care transferred to Dr. Knapp while awaiting results   of work-up.  Anticipate reassessment after labs have returned and imaging is returned.  This note was prepared with assistance of Dragon voice recognition software. Occasional wrong-word or sound-a-like substitutions may have occurred due to the inherent limitations of voice recognition software.     ,  J, MD 08/12/19 1644  

## 2019-08-12 NOTE — ED Notes (Signed)
ED Provider at bedside. 

## 2019-08-12 NOTE — ED Notes (Signed)
Patient transported to X-ray 

## 2019-08-12 NOTE — ED Provider Notes (Addendum)
LAbs reviewed.  Trop eelvated.  WBC elevated and glucose elevated.  LFTs increased.  COVID panel negative.  [place] with ?liver masses.  Will CT to evaluate further.  CT scan without definite PE although suboptimal test.  Patient's CT scan does demonstrate multiple liver masses.  These are concerning for the possibility of cholangiocarcinoma versus adenomas.  There is some evidence of intraperitoneal bleeding.  Patient does remain otherwise hemodynamically stable.  I will consult with the medical service for admission.  Patient will likely require biopsy.  She appears otherwise hemodynamically stable at this time and I do not feel requires any surgical intervention.   Clinical Course as of Aug 13 1255  Mon Aug 12, 2019  2050 Discussed with Dr Nevada Crane.  CT scans show multiple infiltrative liver masses.  Pt also has small volume of blood. ?[place] or adenoma.  No def PE on the CT scan.   [JK]    Clinical Course User Index [JK] Dorie Rank, MD    Dorie Rank, MD 08/12/19 2058    Dorie Rank, MD 08/13/19 1257

## 2019-08-13 ENCOUNTER — Encounter (HOSPITAL_COMMUNITY): Payer: Self-pay | Admitting: Internal Medicine

## 2019-08-13 ENCOUNTER — Inpatient Hospital Stay (HOSPITAL_COMMUNITY): Payer: Medicaid Other

## 2019-08-13 DIAGNOSIS — K769 Liver disease, unspecified: Secondary | ICD-10-CM

## 2019-08-13 DIAGNOSIS — E1310 Other specified diabetes mellitus with ketoacidosis without coma: Secondary | ICD-10-CM

## 2019-08-13 DIAGNOSIS — D134 Benign neoplasm of liver: Secondary | ICD-10-CM

## 2019-08-13 DIAGNOSIS — R160 Hepatomegaly, not elsewhere classified: Secondary | ICD-10-CM

## 2019-08-13 DIAGNOSIS — E1110 Type 2 diabetes mellitus with ketoacidosis without coma: Secondary | ICD-10-CM

## 2019-08-13 DIAGNOSIS — D62 Acute posthemorrhagic anemia: Secondary | ICD-10-CM

## 2019-08-13 DIAGNOSIS — N179 Acute kidney failure, unspecified: Secondary | ICD-10-CM

## 2019-08-13 DIAGNOSIS — E0810 Diabetes mellitus due to underlying condition with ketoacidosis without coma: Secondary | ICD-10-CM

## 2019-08-13 DIAGNOSIS — R1013 Epigastric pain: Secondary | ICD-10-CM

## 2019-08-13 DIAGNOSIS — K661 Hemoperitoneum: Secondary | ICD-10-CM

## 2019-08-13 HISTORY — DX: Liver disease, unspecified: K76.9

## 2019-08-13 LAB — BASIC METABOLIC PANEL
Anion gap: 13 (ref 5–15)
Anion gap: 12 (ref 5–15)
Anion gap: 11 (ref 5–15)
BUN: 19 mg/dL (ref 6–20)
BUN: 25 mg/dL — ABNORMAL HIGH (ref 6–20)
BUN: 29 mg/dL — ABNORMAL HIGH (ref 6–20)
CO2: 18 mmol/L — ABNORMAL LOW (ref 22–32)
CO2: 19 mmol/L — ABNORMAL LOW (ref 22–32)
CO2: 19 mmol/L — ABNORMAL LOW (ref 22–32)
Calcium: 8.6 mg/dL — ABNORMAL LOW (ref 8.9–10.3)
Calcium: 8.7 mg/dL — ABNORMAL LOW (ref 8.9–10.3)
Calcium: 8.4 mg/dL — ABNORMAL LOW (ref 8.9–10.3)
Chloride: 108 mmol/L (ref 98–111)
Chloride: 110 mmol/L (ref 98–111)
Chloride: 113 mmol/L — ABNORMAL HIGH (ref 98–111)
Creatinine, Ser: 2.17 mg/dL — ABNORMAL HIGH (ref 0.44–1.00)
Creatinine, Ser: 3.1 mg/dL — ABNORMAL HIGH (ref 0.44–1.00)
Creatinine, Ser: 3.68 mg/dL — ABNORMAL HIGH (ref 0.44–1.00)
GFR calc Af Amer: 30 mL/min — ABNORMAL LOW (ref >60)
GFR calc Af Amer: 20 mL/min — ABNORMAL LOW (ref >60)
GFR calc Af Amer: 16 mL/min — ABNORMAL LOW (ref >60)
GFR calc non Af Amer: 26 mL/min — ABNORMAL LOW (ref >60)
GFR calc non Af Amer: 17 mL/min — ABNORMAL LOW (ref >60)
GFR calc non Af Amer: 14 mL/min — ABNORMAL LOW (ref >60)
Glucose, Bld: 412 mg/dL — ABNORMAL HIGH (ref 70–99)
Glucose, Bld: 209 mg/dL — ABNORMAL HIGH (ref 70–99)
Glucose, Bld: 188 mg/dL — ABNORMAL HIGH (ref 70–99)
Potassium: 4.7 mmol/L (ref 3.5–5.1)
Potassium: 4.3 mmol/L (ref 3.5–5.1)
Potassium: 4.4 mmol/L (ref 3.5–5.1)
Sodium: 139 mmol/L (ref 135–145)
Sodium: 141 mmol/L (ref 135–145)
Sodium: 143 mmol/L (ref 135–145)

## 2019-08-13 LAB — CBG MONITORING, ED
Glucose-Capillary: 159 mg/dL — ABNORMAL HIGH (ref 70–99)
Glucose-Capillary: 174 mg/dL — ABNORMAL HIGH (ref 70–99)
Glucose-Capillary: 183 mg/dL — ABNORMAL HIGH (ref 70–99)
Glucose-Capillary: 191 mg/dL — ABNORMAL HIGH (ref 70–99)
Glucose-Capillary: 192 mg/dL — ABNORMAL HIGH (ref 70–99)
Glucose-Capillary: 204 mg/dL — ABNORMAL HIGH (ref 70–99)
Glucose-Capillary: 208 mg/dL — ABNORMAL HIGH (ref 70–99)
Glucose-Capillary: 213 mg/dL — ABNORMAL HIGH (ref 70–99)
Glucose-Capillary: 236 mg/dL — ABNORMAL HIGH (ref 70–99)
Glucose-Capillary: 281 mg/dL — ABNORMAL HIGH (ref 70–99)
Glucose-Capillary: 398 mg/dL — ABNORMAL HIGH (ref 70–99)
Glucose-Capillary: 402 mg/dL — ABNORMAL HIGH (ref 70–99)

## 2019-08-13 LAB — COMPREHENSIVE METABOLIC PANEL
ALT: 739 U/L — ABNORMAL HIGH (ref 0–44)
AST: 1139 U/L — ABNORMAL HIGH (ref 15–41)
Albumin: 3.4 g/dL — ABNORMAL LOW (ref 3.5–5.0)
Alkaline Phosphatase: 84 U/L (ref 38–126)
Anion gap: 15 (ref 5–15)
BUN: 22 mg/dL — ABNORMAL HIGH (ref 6–20)
CO2: 18 mmol/L — ABNORMAL LOW (ref 22–32)
Calcium: 9 mg/dL (ref 8.9–10.3)
Chloride: 109 mmol/L (ref 98–111)
Creatinine, Ser: 2.82 mg/dL — ABNORMAL HIGH (ref 0.44–1.00)
GFR calc Af Amer: 22 mL/min — ABNORMAL LOW (ref >60)
GFR calc non Af Amer: 19 mL/min — ABNORMAL LOW (ref >60)
Glucose, Bld: 215 mg/dL — ABNORMAL HIGH (ref 70–99)
Potassium: 4.5 mmol/L (ref 3.5–5.1)
Sodium: 142 mmol/L (ref 135–145)
Total Bilirubin: 0.8 mg/dL (ref 0.3–1.2)
Total Protein: 7 g/dL (ref 6.5–8.1)

## 2019-08-13 LAB — HEPATITIS B SURFACE ANTIGEN: Hepatitis B Surface Ag: NONREACTIVE

## 2019-08-13 LAB — CBC
HCT: 32.4 % — ABNORMAL LOW (ref 36.0–46.0)
Hemoglobin: 9.9 g/dL — ABNORMAL LOW (ref 12.0–15.0)
MCH: 28.4 pg (ref 26.0–34.0)
MCHC: 30.6 g/dL (ref 30.0–36.0)
MCV: 93.1 fL (ref 80.0–100.0)
Platelets: 203 10*3/uL (ref 150–400)
RBC: 3.48 MIL/uL — ABNORMAL LOW (ref 3.87–5.11)
RDW: 14.4 % (ref 11.5–15.5)
WBC: 16 10*3/uL — ABNORMAL HIGH (ref 4.0–10.5)
nRBC: 0.8 % — ABNORMAL HIGH (ref 0.0–0.2)

## 2019-08-13 LAB — GLUCOSE, CAPILLARY
Glucose-Capillary: 130 mg/dL — ABNORMAL HIGH (ref 70–99)
Glucose-Capillary: 170 mg/dL — ABNORMAL HIGH (ref 70–99)
Glucose-Capillary: 177 mg/dL — ABNORMAL HIGH (ref 70–99)
Glucose-Capillary: 168 mg/dL — ABNORMAL HIGH (ref 70–99)
Glucose-Capillary: 181 mg/dL — ABNORMAL HIGH (ref 70–99)
Glucose-Capillary: 199 mg/dL — ABNORMAL HIGH (ref 70–99)
Glucose-Capillary: 187 mg/dL — ABNORMAL HIGH (ref 70–99)

## 2019-08-13 LAB — HIV ANTIBODY (ROUTINE TESTING W REFLEX): HIV Screen 4th Generation wRfx: NONREACTIVE

## 2019-08-13 LAB — HEMOGLOBIN AND HEMATOCRIT, BLOOD
HCT: 28.8 % — ABNORMAL LOW (ref 36.0–46.0)
HCT: 28.2 % — ABNORMAL LOW (ref 36.0–46.0)
HCT: 25.3 % — ABNORMAL LOW (ref 36.0–46.0)
Hemoglobin: 9 g/dL — ABNORMAL LOW (ref 12.0–15.0)
Hemoglobin: 8.7 g/dL — ABNORMAL LOW (ref 12.0–15.0)
Hemoglobin: 7.8 g/dL — ABNORMAL LOW (ref 12.0–15.0)

## 2019-08-13 LAB — BETA-HYDROXYBUTYRIC ACID
Beta-Hydroxybutyric Acid: 3.38 mmol/L — ABNORMAL HIGH (ref 0.05–0.27)
Beta-Hydroxybutyric Acid: 0.46 mmol/L — ABNORMAL HIGH (ref 0.05–0.27)
Beta-Hydroxybutyric Acid: 0.1 mmol/L (ref 0.05–0.27)

## 2019-08-13 LAB — PROTIME-INR
INR: 1.2 (ref 0.8–1.2)
Prothrombin Time: 15.5 seconds — ABNORMAL HIGH (ref 11.4–15.2)

## 2019-08-13 LAB — HEPATITIS B CORE ANTIBODY, TOTAL: Hep B Core Total Ab: NONREACTIVE

## 2019-08-13 LAB — ABO/RH: ABO/RH(D): O POS

## 2019-08-13 MED ORDER — SODIUM CHLORIDE 0.9 % IV BOLUS
1000.0000 mL | Freq: Once | INTRAVENOUS | Status: AC
  Administered 2019-08-13: 1000 mL via INTRAVENOUS

## 2019-08-13 MED ORDER — ENOXAPARIN SODIUM 80 MG/0.8ML ~~LOC~~ SOLN: 80.0000 mg | SUBCUTANEOUS | Status: DC

## 2019-08-13 MED ORDER — PSYLLIUM 95 % PO PACK
1.0000 | PACK | Freq: Two times a day (BID) | ORAL | Status: DC
  Administered 2019-08-14 – 2019-08-18 (×5): 1 via ORAL
  Filled 2019-08-13 (×9): qty 1

## 2019-08-13 MED ORDER — PREGABALIN 75 MG PO CAPS
75.0000 mg | ORAL_CAPSULE | Freq: Three times a day (TID) | ORAL | Status: DC
  Administered 2019-08-13 – 2019-08-14 (×4): 75 mg via ORAL
  Filled 2019-08-13: qty 1
  Filled 2019-08-13: qty 3
  Filled 2019-08-13 (×2): qty 1

## 2019-08-13 MED ORDER — DOXEPIN HCL 50 MG PO CAPS: 50.0000 mg | ORAL_CAPSULE | Freq: Every evening | ORAL | Status: DC | PRN

## 2019-08-13 MED ORDER — MEGESTROL ACETATE 40 MG PO TABS
40.0000 mg | ORAL_TABLET | Freq: Every day | ORAL | Status: DC
  Administered 2019-08-14 – 2019-08-18 (×5): 40 mg via ORAL
  Filled 2019-08-13 (×6): qty 1

## 2019-08-13 MED ORDER — MOMETASONE FURO-FORMOTEROL FUM 100-5 MCG/ACT IN AERO
2.0000 | INHALATION_SPRAY | Freq: Two times a day (BID) | RESPIRATORY_TRACT | Status: DC
  Administered 2019-08-13 – 2019-08-18 (×10): 2 via RESPIRATORY_TRACT
  Filled 2019-08-13 (×2): qty 8.8

## 2019-08-13 MED ORDER — AMLODIPINE BESYLATE 5 MG PO TABS
5.0000 mg | ORAL_TABLET | Freq: Every day | ORAL | Status: DC
  Administered 2019-08-13 – 2019-08-16 (×4): 5 mg via ORAL
  Filled 2019-08-13 (×4): qty 1

## 2019-08-13 MED ORDER — BUPROPION HCL ER (XL) 150 MG PO TB24
300.0000 mg | ORAL_TABLET | Freq: Every day | ORAL | Status: DC
  Administered 2019-08-13 – 2019-08-18 (×6): 300 mg via ORAL
  Filled 2019-08-13 (×6): qty 2

## 2019-08-13 MED ORDER — FLUTICASONE PROPIONATE 50 MCG/ACT NA SUSP: 2.0000 | Freq: Every day | NASAL | Status: DC | PRN

## 2019-08-13 MED ORDER — CHLORHEXIDINE GLUCONATE CLOTH 2 % EX PADS: 6.0000 | MEDICATED_PAD | Freq: Every day | CUTANEOUS | Status: DC

## 2019-08-13 MED ORDER — OXYBUTYNIN CHLORIDE ER 15 MG PO TB24
15.0000 mg | ORAL_TABLET | Freq: Every day | ORAL | Status: DC
  Administered 2019-08-14 – 2019-08-18 (×5): 15 mg via ORAL
  Filled 2019-08-13 (×6): qty 1

## 2019-08-13 MED ORDER — DULOXETINE HCL 60 MG PO CPEP
120.0000 mg | ORAL_CAPSULE | Freq: Every day | ORAL | Status: DC
  Administered 2019-08-13 – 2019-08-18 (×4): 120 mg via ORAL
  Filled 2019-08-13 (×6): qty 2

## 2019-08-13 MED ORDER — PREDNISONE 1 MG PO TABS
3.0000 mg | ORAL_TABLET | Freq: Every day | ORAL | Status: DC
  Administered 2019-08-14 – 2019-08-18 (×5): 3 mg via ORAL
  Filled 2019-08-13 (×6): qty 3

## 2019-08-13 MED ORDER — SACUBITRIL-VALSARTAN 97-103 MG PO TABS
1.0000 | ORAL_TABLET | Freq: Two times a day (BID) | ORAL | Status: DC
  Filled 2019-08-13: qty 1

## 2019-08-13 MED ORDER — LORAZEPAM 2 MG/ML IJ SOLN
INTRAMUSCULAR | Status: AC
  Filled 2019-08-13: qty 1

## 2019-08-13 MED ORDER — ALBUTEROL SULFATE (2.5 MG/3ML) 0.083% IN NEBU: 2.5000 mg | INHALATION_SOLUTION | Freq: Four times a day (QID) | RESPIRATORY_TRACT | Status: DC | PRN

## 2019-08-13 MED ORDER — SUCRALFATE 1 GM/10ML PO SUSP
1.0000 g | Freq: Two times a day (BID) | ORAL | Status: DC
  Administered 2019-08-13 – 2019-08-18 (×13): 1 g via ORAL
  Filled 2019-08-13 (×14): qty 10

## 2019-08-13 MED ORDER — LORATADINE 10 MG PO TABS
10.0000 mg | ORAL_TABLET | Freq: Every day | ORAL | Status: DC
  Administered 2019-08-13 – 2019-08-18 (×6): 10 mg via ORAL
  Filled 2019-08-13 (×6): qty 1

## 2019-08-13 MED ORDER — TOPIRAMATE 25 MG PO TABS
75.0000 mg | ORAL_TABLET | Freq: Every day | ORAL | Status: DC
  Administered 2019-08-13 – 2019-08-18 (×6): 75 mg via ORAL
  Filled 2019-08-13 (×6): qty 3

## 2019-08-13 MED ORDER — SODIUM CHLORIDE 0.9 % IV SOLN: INTRAVENOUS | Status: AC

## 2019-08-13 MED ORDER — PRAVASTATIN SODIUM 10 MG PO TABS
10.0000 mg | ORAL_TABLET | Freq: Every day | ORAL | Status: DC
  Administered 2019-08-13: 10 mg via ORAL
  Filled 2019-08-13: qty 1

## 2019-08-13 MED ORDER — ALBUTEROL SULFATE (2.5 MG/3ML) 0.083% IN NEBU: 3.0000 mL | INHALATION_SOLUTION | Freq: Four times a day (QID) | RESPIRATORY_TRACT | Status: DC | PRN

## 2019-08-13 MED ORDER — SENNOSIDES-DOCUSATE SODIUM 8.6-50 MG PO TABS: 1.0000 | ORAL_TABLET | Freq: Every evening | ORAL | Status: DC | PRN

## 2019-08-13 MED ORDER — ALPRAZOLAM 0.5 MG PO TABS
0.5000 mg | ORAL_TABLET | Freq: Two times a day (BID) | ORAL | Status: DC | PRN
  Administered 2019-08-13 – 2019-08-16 (×2): 0.5 mg via ORAL
  Filled 2019-08-13: qty 2
  Filled 2019-08-13: qty 1

## 2019-08-13 MED ORDER — ALBUTEROL SULFATE HFA 108 (90 BASE) MCG/ACT IN AERS: 2.0000 | INHALATION_SPRAY | Freq: Four times a day (QID) | RESPIRATORY_TRACT | Status: DC | PRN

## 2019-08-13 MED ORDER — ONDANSETRON HCL 4 MG/2ML IJ SOLN
4.0000 mg | Freq: Four times a day (QID) | INTRAMUSCULAR | Status: DC | PRN
  Administered 2019-08-13 – 2019-08-16 (×9): 4 mg via INTRAVENOUS
  Filled 2019-08-13 (×9): qty 2

## 2019-08-13 MED ORDER — CARVEDILOL 12.5 MG PO TABS
12.5000 mg | ORAL_TABLET | Freq: Two times a day (BID) | ORAL | Status: DC
  Administered 2019-08-13 – 2019-08-18 (×12): 12.5 mg via ORAL
  Filled 2019-08-13 (×12): qty 1

## 2019-08-13 MED ORDER — CYCLOSPORINE 0.05 % OP EMUL
1.0000 [drp] | Freq: Two times a day (BID) | OPHTHALMIC | Status: DC
  Administered 2019-08-13 – 2019-08-18 (×11): 1 [drp] via OPHTHALMIC
  Filled 2019-08-13 (×12): qty 1

## 2019-08-13 MED ORDER — BENZONATATE 100 MG PO CAPS: 100.0000 mg | ORAL_CAPSULE | Freq: Three times a day (TID) | ORAL | Status: DC | PRN

## 2019-08-13 MED ORDER — AZATHIOPRINE 50 MG PO TABS
100.0000 mg | ORAL_TABLET | Freq: Two times a day (BID) | ORAL | Status: DC
  Administered 2019-08-13 – 2019-08-16 (×8): 100 mg via ORAL
  Filled 2019-08-13 (×9): qty 2

## 2019-08-13 MED ORDER — HYDROXYCHLOROQUINE SULFATE 200 MG PO TABS
200.0000 mg | ORAL_TABLET | Freq: Two times a day (BID) | ORAL | Status: DC
  Administered 2019-08-13 – 2019-08-18 (×13): 200 mg via ORAL
  Filled 2019-08-13 (×13): qty 1

## 2019-08-13 MED ORDER — LORAZEPAM 2 MG/ML IJ SOLN
1.0000 mg | Freq: Once | INTRAMUSCULAR | Status: AC
  Filled 2019-08-13: qty 1

## 2019-08-13 MED ORDER — CHLORHEXIDINE GLUCONATE CLOTH 2 % EX PADS
6.0000 | MEDICATED_PAD | Freq: Every day | CUTANEOUS | Status: DC
  Administered 2019-08-14 – 2019-08-18 (×5): 6 via TOPICAL

## 2019-08-13 MED ORDER — CLOPIDOGREL BISULFATE 75 MG PO TABS: 75.0000 mg | ORAL_TABLET | Freq: Every day | ORAL | Status: DC

## 2019-08-13 MED ORDER — ONDANSETRON HCL 4 MG PO TABS
4.0000 mg | ORAL_TABLET | Freq: Four times a day (QID) | ORAL | Status: DC | PRN
  Administered 2019-08-16: 09:00:00 4 mg via ORAL
  Filled 2019-08-13: qty 1

## 2019-08-13 MED ORDER — HYDROMORPHONE HCL 1 MG/ML IJ SOLN
0.5000 mg | INTRAMUSCULAR | Status: DC | PRN
  Administered 2019-08-13 – 2019-08-18 (×22): 0.5 mg via INTRAVENOUS
  Filled 2019-08-13 (×9): qty 0.5
  Filled 2019-08-13 (×2): qty 1
  Filled 2019-08-13 (×10): qty 0.5
  Filled 2019-08-13: qty 1

## 2019-08-13 MED ORDER — ISOSORB DINITRATE-HYDRALAZINE 20-37.5 MG PO TABS
2.0000 | ORAL_TABLET | Freq: Three times a day (TID) | ORAL | Status: DC
  Administered 2019-08-13 – 2019-08-18 (×16): 2 via ORAL
  Filled 2019-08-13 (×19): qty 2

## 2019-08-13 MED ORDER — FAMOTIDINE 20 MG PO TABS
20.0000 mg | ORAL_TABLET | Freq: Two times a day (BID) | ORAL | Status: DC
  Administered 2019-08-13 – 2019-08-14 (×3): 20 mg via ORAL
  Filled 2019-08-13 (×4): qty 1

## 2019-08-13 MED ORDER — ASPIRIN EC 81 MG PO TBEC: 81.0000 mg | DELAYED_RELEASE_TABLET | Freq: Every day | ORAL | Status: DC

## 2019-08-13 MED ORDER — SODIUM CHLORIDE 0.9 % IV SOLN: INTRAVENOUS | Status: DC

## 2019-08-13 NOTE — ED Notes (Signed)
Paged admitting to provide updates: Pt continues to be restless and having abd pain have given PRN pain medication. Pt remains tachycardic with a low BP that has been fluctuating currently 90/53 but has had a few blood pressures readings in [id number] systolic. Pt is alert and oriented but sitting up in bed appearing uncomfortable. I spoke with the provider coming on today at [place].

## 2019-08-13 NOTE — Consult Note (Signed)
[facility] Consult: 8:52 AM 08/13/2019  LOS: 1 day    Referring Provider: Dr Kennon Rounds  Primary Care Physician:  Bartholome Bill, MD Primary Gastroenterologist: Althia Forts patient.  Has been followed previously by Dr. Maryland Pink and Ned Card, Dr Ronna Polio  at [facility].     Reason for Consultation: Elevated LFTs.   HPI: [name] is a 49 y.o. female.  Hx morbid obesity.  Lupus.  Sjogren's syndrome.  DM 2.  CHF.  OSA.  Home oxygen.  Diabetic gastroparesis.  Anemia.  Abnormal uterine bleeding.  CVA, on plavix.    History of liver lesions.  Biopsy of right liver lesion with benign pathology at [facility].  S/p "Bland" RFA 2014. MRI 01/2013 showed 1.  7.1 x 7.6 x 6.4 cm lesion in segment 4B and 5 of the liver has unusual imaging characteristics, detailed above.  Given the lack of accumulation of contrast agent on delayed phase imaging with Eovist, which indicates a lesion not of hepatocellular origin, differential considerations primarily include an atypical giant cavernous hemangioma, atypical adenoma with central necrosis, or less likely a large solitary metastasis with central necrosis. Lack of enhancement on the delayed images with Eovist indicates that this is not a large focal nodular hyperplasia ([place]).  At this time, a short term follow-up MRI with Multihance (not Eovist) in 2- 3 months is recommended to assess for size stability, and to better evaluate the dynamic post-contrast enhancement characteristics of this lesion. 10/2013 CT abdomen.  Right liver lesion smaller at 2.5 cm compared with 10.6 in 2014. 05/2014 MRI abdomen decreasing size of hepatic segment 4B/5 mass following bland embolization.  Persistent enhancing tissue posterior to the hemorrhagic component which likely represents  viable tissue.  Resolution of hepatic capsular bulging noted on previous exam.  Hypervascular, segment 3 lesion seen on previous exam not well seen today secondary to motion and may represent a second adenoma. 01/2013 EGD for nausea, vomiting, abdominal pain.  Distal reflux esophagitis.  Non-bloody n/v, inability to tolerate po for 3 weeks.  At first she thought it was food poisoning.  After a while she developed nonfocal abdominal soreness she attributed to the frequent vomiting.  At baseline she is on high-dose Humulin of 200 units daily as well as Imuran, Plaquenil. T bili, alkaline phosphatase normal.  AST/ALT 260/160 >> 1139/739.  Lipase 18. AKI.  25/3.1.   Serum glucose 412 >> 215. High-sensitivity troponin 25 >> 37. Hgb 10.9 >> 9.9.  MCV 92.  Platelets 203.  WBC 16 K. UA notable for numerous RBCs and greater than 500 glucose. COVID-19 negative. CTAP chest with contrast: Suboptimal study for PE but no PE found.  Pulmonary ATX and nonspecific groundglass opacity highly abnormal liver with multifocal infiltrative masslike areas of abnormal enhancement in the right lobe and associated small amount hemoperitoneum in the abdomen and pelvis.  Considerations include hepatocellular carcinoma or hepatic adenomas.  These were not seen on CT scan in 2017.  Stable to mildly improved cardiomegaly.  Past Medical History:  Diagnosis Date   Acute renal failure ([place])  Intractable nausea vomiting secondary to diabetic gastroparesis causing dehydration and acute renal failure /notes 04/01/2013   Anginal pain ([place])    Anxiety    Bell's palsy 08/09/2010   CAP (community acquired pneumonia) 10/2011   Archie Endo 11/08/2011   Chest pain 01/13/2014   CHF (congestive heart failure) ([place])    Diabetic gastroparesis associated with type 2 diabetes mellitus ([place])    this is presumed diagnoses, not confirmed by any studies.    Eczema    Family history of malignant neoplasm of breast    Family history of  malignant neoplasm of ovary    GERD (gastroesophageal reflux disease)    High cholesterol    Hypertension    Liver mass 2014   biopsied 03/2013 at [facility], not malignant.  is to undergo radiologic ablation of the mass in sept/October 2014.    Lupus ([place])    Migraines    "maybe a couple times/yr" (04/01/2013)   Obesity    Obstructive sleep apnea on CPAP 2011   On home oxygen therapy    "2L 24/7" (02/28/2015)   SJOGREN'S SYNDROME 08/09/2010   SLE (systemic lupus erythematosus) ([place])    Archie Endo 12/01/2010   Stroke ([place]) 2010; 10/2012   "left side is still weak from it, never fully regained full strength; no additions from stroke 10/2012"    Past Surgical History:  Procedure Laterality Date   BREAST CYST EXCISION Left 08/2005   epidermoid   CARDIAC CATHETERIZATION  02/07/12   ECTOPIC PREGNANCY SURGERY  1999   ECTOPIC PREGNANCY SURGERY  1999   ESOPHAGOGASTRODUODENOSCOPY N/A 02/26/2013   Procedure: ESOPHAGOGASTRODUODENOSCOPY (EGD);  Surgeon: Irene Shipper, MD;  Location: [facility] ENDOSCOPY;  Service: Endoscopy;  Laterality: N/A;   ESOPHAGOGASTRODUODENOSCOPY N/A 03/02/2015   Procedure: ESOPHAGOGASTRODUODENOSCOPY (EGD);  Surgeon: Milus Banister, MD;  Location: [facility];  Service: Endoscopy;  Laterality: N/A;   HERNIA REPAIR     LEFT AND RIGHT HEART CATHETERIZATION WITH CORONARY ANGIOGRAM N/A 02/07/2012   Procedure: LEFT AND RIGHT HEART CATHETERIZATION WITH CORONARY ANGIOGRAM;  Surgeon: Laverda Page, MD;  Location: [facility] CATH LAB;  Service: Cardiovascular;  Laterality: N/A;   LIVER BIOPSY  03/2013   liver mass/medical hx noted above   MUSCLE BIOPSY     for lupus/notes 0/09/5425   UMBILICAL HERNIA REPAIR  1980's    Prior to Admission medications   Medication Sig Start Date End Date Taking? Authorizing Provider  albuterol (PROAIR HFA) 108 (90 BASE) MCG/ACT inhaler Inhale 2 puffs into the lungs every 6 (six) hours as needed for wheezing or shortness of breath.    Yes  [provider]  albuterol (PROVENTIL) (2.5 MG/3ML) 0.083% nebulizer solution Take 2.5 mg by nebulization every 6 (six) hours as needed for wheezing.   Yes [provider]  BIDIL 20-37.5 MG tablet TAKE TWO (2) TABLETS BY MOUTH THREE TIMES DAILY  Patient taking differently: Take 2 tablets by mouth 3 (three) times daily.  04/03/19  Yes Miquel Dunn, NP  Accu-Chek Softclix Lancets lancets Use to monitor glucose levels 4 times per day; E11.9 10/19/18   Renato Shin, MD  ALPRAZolam Duanne Moron) 0.5 MG tablet Take 0.5 mg 2 (two) times daily as needed by mouth for anxiety or sleep.  04/19/17   [provider]  amLODipine (NORVASC) 5 MG tablet Take 1 tablet (5 mg total) by mouth daily. 10/19/18   Miquel Dunn, NP  aspirin EC 81 MG tablet Take 81 mg by mouth daily.    [provider]  azaTHIOprine (IMURAN) 50 MG tablet Take 100 mg by mouth 2 (two) times daily.     [provider]  B-D UF III MINI PEN NEEDLES 31G X 5 MM MISC USE AS DIRECTED TWICE A DAY 10/31/18   Renato Shin, MD  benzonatate (TESSALON) 100 MG capsule Take by mouth continuous as needed for cough.    [provider]  Blood Glucose Monitoring Suppl (ACCU-CHEK AVIVA) device Use as instructed 10/25/18 10/25/19  Philemon Kingdom, MD  buPROPion (WELLBUTRIN XL) 300 MG 24 hr tablet Take 300 mg by mouth daily. 06/28/19   [provider]  BYETTA 10 MCG PEN 10 MCG/0.04ML SOPN injection INJECT 0.04ML UNDER THE SKIN 2 TIMES DAILY WITH A MEAL 08/07/19   Renato Shin, MD  carvedilol (COREG) 12.5 MG tablet Take 12.5 mg by mouth 2 (two) times daily with a meal.     [provider]  cetirizine (ZYRTEC) 10 MG tablet Take 10 mg by mouth daily as needed for allergies.     [provider]  clopidogrel (PLAVIX) 75 MG tablet Take 75 mg by mouth daily.    [provider]  clotrimazole-betamethasone (LOTRISONE) cream Apply 1 application topically 2 (two) times  daily. Patient taking differently: Apply 1 application topically as needed.  01/01/19   Renato Shin, MD  cyanocobalamin (,VITAMIN B-12,) 1000 MCG/ML injection Inject into the muscle. Every other month 12/17/18   [provider]  cycloSPORINE (RESTASIS) 0.05 % ophthalmic emulsion Place 1 drop into both eyes 3 (three) times daily.    [provider]  diclofenac sodium (VOLTAREN) 1 % GEL Apply 2-4 g 4 (four) times daily as needed topically (joint pain). 06/06/17   Geradine Girt, DO  doxepin (SINEQUAN) 50 MG capsule Take 50-100 mg by mouth at bedtime as needed (sleep).     [provider]  DULoxetine (CYMBALTA) 60 MG capsule Take 120 mg by mouth at bedtime.     [provider]  EASY TOUCH PEN NEEDLES 32G X 4 MM MISC USE TO INJECT INSULIN TWICE DAILY 08/07/19   Renato Shin, MD  ENTRESTO 97-103 MG TAKE 1 TABLET BY MOUTH TWICE A DAY Patient taking differently: Take 1 tablet by mouth 2 (two) times daily.  06/04/19   Adrian Prows, MD  etonogestrel-ethinyl estradiol (NUVARING) 0.12-0.015 MG/24HR vaginal ring Place 1 each every 28 (twenty-eight) days vaginally. Insert vaginally and leave in place for 3 consecutive weeks, then remove for 1 week.    [provider]  famotidine (PEPCID) 20 MG tablet Take 20 mg by mouth 2 (two) times daily.    [provider]  fluticasone (FLONASE) 50 MCG/ACT nasal spray Place 2 sprays into both nostrils continuous as needed for allergies or rhinitis.    [provider]  glucose blood (ACCU-CHEK AVIVA PLUS) test strip Use to check blood sugar 4 times daily. **Needs appt for refills** 07/09/19   Elayne Snare, MD  hydroxychloroquine (PLAQUENIL) 200 MG tablet Take 200 mg by mouth 2 (two) times daily.     [provider]  insulin NPH Human (HUMULIN N) 100 UNIT/ML injection Inject 2 mLs (200 Units total) into the skin every morning. And syringes 3/day 07/18/19   Renato Shin, MD  Insulin Syringe-Needle U-100 (EASY TOUCH  INSULIN SYRINGE) 31G X 5/16" 1 ML MISC USE AS DIRECTED THREE TIMES A DAY 10/12/18   Renato Shin, MD  megestrol (MEGACE) 40 MG tablet Take 40 mg by mouth daily. 01/30/15   [provider]  metFORMIN (  GLUCOPHAGE-XR) 500 MG 24 hr tablet Take 1,000 mg by mouth 2 (two) times daily.    [provider]  mometasone-formoterol (DULERA) 100-5 MCG/ACT AERO Inhale 2 puffs into the lungs 2 (two) times daily. 05/29/17   [provider]  oxybutynin (DITROPAN-XL) 10 MG 24 hr tablet Take 15 mg by mouth daily.  08/30/17   [provider]  Polyvinyl Alcohol-Povidone (REFRESH OP) Place 1 drop 2 (two) times daily into both eyes.     [provider]  pravastatin (PRAVACHOL) 10 MG tablet Take 10 mg by mouth daily.    [provider]  predniSONE (DELTASONE) 1 MG tablet Take 3 mg by mouth daily.  04/21/17   [provider]  pregabalin (LYRICA) 75 MG capsule Take 75 mg 3 (three) times daily by mouth.  01/28/15   [provider]  psyllium (METAMUCIL MULTIHEALTH FIBER) 58.6 % powder Take 1 packet by mouth 2 (two) times daily.    [provider]  spironolactone (ALDACTONE) 50 MG tablet TAKE 1 TABLET BY MOUTH EVERY DAY Patient taking differently: Take 50 mg by mouth daily.  04/09/19   Miquel Dunn, NP  sucralfate (CARAFATE) 1 GM/10ML suspension Take 1 g by mouth 2 (two) times daily. 07/08/19   [provider]  topiramate (TOPAMAX) 25 MG tablet Take 75 mg by mouth at bedtime. 03/17/16   [provider]  torsemide (DEMADEX) 20 MG tablet 40 mg in AM and 20 mg in PM Patient taking differently: Take 20-40 mg by mouth See admin instructions. Take 2 tablet (40 mg) by mouth every morning and 1 tablet (20 mg) at night 09/23/17   Debbe Odea, MD  traMADol (ULTRAM) 50 MG tablet TAKE 1 TO 2 TABLETS BY MOUTH EVERY 6 HOURS AS NEEDED FOR PAIN**PA REQ** Patient taking differently: Take 50-100 mg by mouth every 6 (six) hours as needed (pain).   01/21/19   Pete Pelt, PA-C  Vitamin D, Ergocalciferol, (DRISDOL) 1.25 MG (50000 UNIT) CAPS capsule Take 50,000 Units by mouth every 7 (seven) days.    [provider]    Scheduled Meds:  amLODipine  5 mg Oral Daily   azaTHIOprine  100 mg Oral BID   buPROPion  300 mg Oral Daily   carvedilol  12.5 mg Oral BID WC   cycloSPORINE  1 drop Both Eyes TID   DULoxetine  120 mg Oral QHS   famotidine  20 mg Oral BID   hydroxychloroquine  200 mg Oral BID   isosorbide-hydrALAZINE  2 tablet Oral TID   loratadine  10 mg Oral Daily   megestrol  40 mg Oral Daily   mometasone-formoterol  2 puff Inhalation BID   oxybutynin  15 mg Oral Daily   pravastatin  10 mg Oral Daily   predniSONE  3 mg Oral Daily   pregabalin  75 mg Oral TID   psyllium  1 packet Oral BID   sodium chloride flush  10-40 mL Intracatheter Q12H   sucralfate  1 g Oral BID   topiramate  75 mg Oral QHS   Infusions:  sodium chloride 75 mL/hr at 08/13/19 0223   dextrose 5 % and 0.45% NaCl 75 mL/hr at 08/13/19 0443   insulin 8 Units/hr (08/13/19 0802)   PRN Meds: albuterol, ALPRAZolam, benzonatate, dextrose, doxepin, fluticasone, HYDROmorphone (DILAUDID) injection, ondansetron **OR** ondansetron (ZOFRAN) IV, senna-docusate, sodium chloride flush   Allergies as of 08/12/2019 - Review Complete 08/12/2019  Allergen Reaction Noted   Metoclopramide Other (See Comments) 04/25/2013  Codeine Itching, Rash, and Other (See Comments) 05/09/2013   Penicillins Itching    Hydrocodone Rash 08/07/2016   Percocet [oxycodone-acetaminophen] Hives and Rash 10/06/2016    Family History  Problem Relation Age of Onset   Diabetes Mother    Asthma Mother    Urticaria Mother    Hypertension Mother    Breast cancer Maternal Aunt 72       currently 72   Stomach cancer Maternal Uncle        dx 34s; deceased   Prostate cancer Maternal Uncle        deceased 55s   Cancer Maternal Aunt        unk.  primary   Cancer Maternal Aunt        unk. primary   Ovarian cancer Maternal Aunt        deceased 39s   Hypertension Other    Stroke Other    Arthritis Other    Ovarian cancer Sister 73       maternal half-sister; RAD51C positive   Cancer Paternal Aunt        unk. primary; deceased 8s   Prostate cancer Paternal Uncle        deceased 29s   Breast cancer Cousin        deceased 72s; daughter of mat uncle with stomach ca   Breast cancer Maternal Aunt        deceased 6s   Asthma Brother    Asthma Brother    Allergic rhinitis Neg Hx    Angioedema Neg Hx     Social History   Socioeconomic History   Marital status: Single    Spouse name: Not on file   Number of children: 1   Years of education: Not on file   Highest education level: Not on file  Occupational History    Employer: UNEMPLOYED    Comment: student  Tobacco Use   Smoking status: Former Smoker    Packs/day: 0.50    Years: 3.00    Pack years: 1.50    Types: Cigarettes    Quit date: 06/02/1991    Years since quitting: 28.2   Smokeless tobacco: Never Used   Tobacco comment: quit 28 yrs ago  Substance and Sexual Activity   Alcohol use: No    Alcohol/week: 0.0 standard drinks   Drug use: No   Sexual activity: Yes    Partners: Male    Birth control/protection: None  Other Topics Concern   Not on file  Social History Narrative   Regular exercise-yes   Social Determinants of Health   Financial Resource Strain:    Difficulty of Paying Living Expenses: Not on file  Food Insecurity:    Worried About Charity fundraiser in the Last Year: Not on file   [employer] of Food in the Last Year: Not on file  Transportation Needs:    Lack of Transportation (Medical): Not on file   Lack of Transportation (Non-Medical): Not on file  Physical Activity:    Days of Exercise per Week: Not on file   Minutes of Exercise per Session: Not on file  Stress:    Feeling of Stress : Not on file    Social Connections:    Frequency of Communication with Friends and Family: Not on file   Frequency of Social Gatherings with Friends and Family: Not on file   Attends Religious Services: Not on file   Active Member of Clubs or Organizations: Not on file  Attends Archivist Meetings: Not on file   Marital Status: Not on file  Intimate Partner Violence:    Fear of Current or Ex-Partner: Not on file   Emotionally Abused: Not on file   Physically Abused: Not on file   Sexually Abused: Not on file    REVIEW OF SYSTEMS: Constitutional: Weakness, fatigue. ENT:  No nose bleeds Pulm: Denies significant dyspnea.  No cough. CV:  No palpitations, no LE edema.  No angina  GU:  No hematuria, no frequency GI: See HPI. Heme: No unusual bleeding or bruising. Transfusions: No transfusions  Neuro:  No headaches, no peripheral tingling or numbness.  No dizziness, no syncope. Derm:  No itching, no rash or sores.  Endocrine:  No sweats or chills.  No polyuria or dysuria Immunization: Not queried. Travel:  None beyond local counties in last few months.    PHYSICAL EXAM: Vital signs in last 24 hours: Vitals:   08/13/19 0530 08/13/19 0545  BP: (!) 81/51 (!) 90/53  Pulse:    Resp: (!) 29 (!) 29  Temp:    SpO2:     Wt Readings from Last 3 Encounters:  08/13/19 (!) 178.7 kg  07/18/19 (!) 193.2 kg  06/07/19 (!) 176.4 kg    General: Supradrowsy after having received Dilaudid.  Required numerous efforts to wake her up at which point she was able to provide reliable history but would drift off to sleep again. Head: No facial asymmetry or swelling.  No signs of head trauma. Eyes: No conjunctival pallor.  No scleral icterus. Ears: Not hard of hearing Nose: No congestion, no discharge. Mouth: Oropharynx moist, pink, clear.  Fair dentition.  Tongue midline Neck:  obese Lungs: Moderately labored breathing.  No cough. Heart: Tachycardic, regular. Abdomen: Obese.  Soft.  Bowel  sounds quiet.  Mild, diffuse tenderness.  No HSM, masses, bruits, hernias appreciated..   Rectal: Deferred Musc/Skeltl: No joint redness or, swelling or gross deformity. Extremities: Lower extremity edema in the legs, feet but no pitting. Neurologic: Drowsy but when alert oriented x3 and able to provide history.  Moves all 4 limbs.  Strength not tested but no tremors. Skin: No rash, no sores, no telangiectasia. Nodes: No cervical adenopathy Psych: Cooperative, calm.  Intake/Output from previous day: 01/11 0701 - 01/12 0700 In: 382.5 [I.V.:182.5; IV Piggyback:200] Out: -  Intake/Output this shift: No intake/output data recorded.  LAB RESULTS: Recent Labs    08/12/19 1332 08/12/19 1523 08/13/19 0606  WBC 12.5*  --  16.0*  HGB 10.9* 11.9* 9.9*  HCT 35.6* 35.0* 32.4*  PLT 271  --  203   BMET Lab Results  Component Value Date   NA 142 08/13/2019   NA 139 08/13/2019   NA 137 08/12/2019   K 4.5 08/13/2019   K 4.7 08/13/2019   K 4.8 08/12/2019   CL 109 08/13/2019   CL 108 08/13/2019   CL 102 08/12/2019   CO2 18 (L) 08/13/2019   CO2 18 (L) 08/13/2019   CO2 19 (L) 08/12/2019   GLUCOSE 215 (H) 08/13/2019   GLUCOSE 412 (H) 08/13/2019   GLUCOSE 454 (H) 08/12/2019   BUN 22 (H) 08/13/2019   BUN 19 08/13/2019   BUN 10 08/12/2019   CREATININE 2.82 (H) 08/13/2019   CREATININE 2.17 (H) 08/13/2019   CREATININE 1.28 (H) 08/12/2019   CALCIUM 9.0 08/13/2019   CALCIUM 8.6 (L) 08/13/2019   CALCIUM 9.4 08/12/2019   LFT Recent Labs    08/12/19 1332 08/13/19 0606  PROT 6.6 7.0  ALBUMIN 3.1* 3.4*  AST 260* 1,139*  ALT 160* 739*  ALKPHOS 76 84  BILITOT 0.4 0.8   PT/INR Lab Results  Component Value Date   INR 1.05 01/05/2016   INR 0.94 01/25/2015   INR 0.90 03/05/2014   Hepatitis Panel No results for input(s): HEPBSAG, HCVAB, HEPAIGM, HEPBIGM in the last 72 hours. C-Diff No components found for: CDIFF Lipase     Component Value Date/Time   LIPASE 18 08/12/2019 1332        RADIOLOGY STUDIES: DG Chest 2 View  Result Date: 08/12/2019 CLINICAL DATA:  49 year old female with history of shortness of breath. EXAM: CHEST - 2 VIEW COMPARISON:  Chest x-ray 03/26/2018. FINDINGS: Lung volumes are low. Bibasilar opacities which may reflect areas of atelectasis and/or consolidation. No definite pleural effusions. No evidence of pulmonary edema. Mild cardiomegaly. Upper mediastinal contours are within normal limits. IMPRESSION: 1. Low lung volumes with bibasilar opacities which may be areas of atelectasis and/or consolidation. Electronically Signed   By: Vinnie Langton M.D.   On: 08/12/2019 16:17   CT Angio Chest PE W and/or Wo Contrast  Result Date: 08/12/2019 CLINICAL DATA:  49 year old female with shortness of breath. Nausea vomiting. Epigastric pain and abnormal abdomen ultrasound earlier today suspicious for hepatic metastatic disease. History of lupus. EXAM: CT ANGIOGRAPHY CHEST CT ABDOMEN AND PELVIS WITH CONTRAST TECHNIQUE: Multidetector CT imaging of the chest was performed using the standard protocol during bolus administration of intravenous contrast. Multiplanar CT image reconstructions and MIPs were obtained to evaluate the vascular anatomy. Multidetector CT imaging of the abdomen and pelvis was performed using the standard protocol during bolus administration of intravenous contrast. CONTRAST:  160 milliliters OMNIPAQUE IOHEXOL 350 MG/ML SOLN, 50m OMNIPAQUE IOHEXOL 350 MG/ML SOLN COMPARISON:  CTA chest 03/26/2018. CT Abdomen and Pelvis 01/05/2016. FINDINGS: CTA CHEST FINDINGS Cardiovascular: Late but adequate contrast bolus timing in the pulmonary arterial tree. Superimposed respiratory motion and low lung volumes. There is no central or hilar pulmonary artery filling defect. But the bilateral pulmonary artery branches are not well evaluated despite repeated contrast bolus attempt. Negative thoracic aorta. Mild cardiomegaly is stable to improved since 2019. No  pericardial effusion. No calcified coronary artery atherosclerosis is evident. Mediastinum/Nodes: Negative.  No mediastinal lymphadenopathy. Lungs/Pleura: Low lung volumes with patent major airways aside from atelectatic changes. Nonspecific scattered and asymmetric bilateral pulmonary ground-glass opacity with confluent peribronchial opacity in the superior segment of the right lower lobe (series 4, image 53). Other bilateral lower lobe mildly confluent peribronchial opacity more resembles atelectasis. No pleural effusion. Musculoskeletal: No acute or suspicious osseous lesion identified in the chest. Review of the MIP images confirms the above findings. CT ABDOMEN and PELVIS FINDINGS Hepatobiliary: Multiple large at least 6-7 millimeter areas of abnormally decreased and heterogeneous liver enhancement in the right lobe are poorly marginated and masslike. The right lobe appears mildly expanded compared to 2017. The left lobe may be spared. The gallbladder is grossly normal. There is a small volume of intermediate density free fluid adjacent to the liver compatible with hemoperitoneum. Pancreas: Negative. Spleen: Diminutive and negative. Adrenals/Urinary Tract: Adrenal glands appear normal. No hydronephrosis. Bilateral renal enhancement is symmetric and within normal limits but on delayed images there is minimal contrast excretion from both kidneys. The proximal ureters are decompressed. Diminutive and unremarkable urinary bladder. There is a small 2 millimeter upper pole right renal calculus which is new from 2017. Stomach/Bowel: Largely decompressed and negative large bowel from the splenic flexure distally. There  may be mild sigmoid diverticulosis. Redundant proximal transverse colon with mild retained stool. Similar mild retained stool in the right colon. No large bowel inflammation is evident. Terminal ileum appears negative. Normal appendix suspected on coronal image 72. No dilated small bowel. Mildly fluid  distended but otherwise negative stomach. No free air. Free fluid with intermediate density mostly around the liver and in both gutters, small volume overall. Vascular/Lymphatic: Arterial phase images of the abdomen and pelvis were included on the chest study. Abdominal aorta, its branches, iliac and proximal femoral arteries appear patent and within normal limits. On these images the portal venous system is not well evaluated. The main portal vein and SMV appear diminutive. Portal vein was also not well visualized on ultrasound earlier today. No lymphadenopathy. Reproductive: Stable and negative allowing for adjacent hemoperitoneum. Other: Intermediate density pelvic free fluid such as due to hemoperitoneum (series 9, image 82). Volume is moderate and both lower quadrants are also affected. Musculoskeletal: Lower lumbar facet degeneration. No destructive or suspicious osseous lesion identified. Review of the MIP images confirms the above findings. IMPRESSION: 1. Suboptimal CTA chest for PE with no central or hilar pulmonary embolus. If there is strong clinical suspicion a pulmonary V/Q scan would be complementary to evaluate the segmental and distal branches. 2. Highly abnormal liver with multifocal infiltrative mass-like areas of abnormal enhancement in the right lobe and associated small volume of Hemoperitoneum in the abdomen and pelvis. Top differential considerations include hemorrhagic Hepatocellular Carcinoma or Hepatic Adenomas. These were not evident on a 2017 CT Abdomen and Pelvis. 3. Pulmonary atelectasis and additional nonspecific ground-glass opacity. Acute pulmonary infection or aspiration difficult to exclude. No pleural effusion. 4. Little renal contrast excretion on the delayed images, but no obstructive uropathy. Consider ATN or other intrinsic acute renal failure. 5. Cardiomegaly is stable or mildly improved since 2019. Study discussed by telephone with Dr. Dorie Rank on 08/12/2019 at 20:50 .  Electronically Signed   By: Genevie Ann M.D.   On: 08/12/2019 20:56   CT ABDOMEN PELVIS W CONTRAST  Result Date: 08/12/2019 CLINICAL DATA:  49 year old female with shortness of breath. Nausea vomiting. Epigastric pain and abnormal abdomen ultrasound earlier today suspicious for hepatic metastatic disease. History of lupus. EXAM: CT ANGIOGRAPHY CHEST CT ABDOMEN AND PELVIS WITH CONTRAST TECHNIQUE: Multidetector CT imaging of the chest was performed using the standard protocol during bolus administration of intravenous contrast. Multiplanar CT image reconstructions and MIPs were obtained to evaluate the vascular anatomy. Multidetector CT imaging of the abdomen and pelvis was performed using the standard protocol during bolus administration of intravenous contrast. CONTRAST:  160 milliliters OMNIPAQUE IOHEXOL 350 MG/ML SOLN, 28m OMNIPAQUE IOHEXOL 350 MG/ML SOLN COMPARISON:  CTA chest 03/26/2018. CT Abdomen and Pelvis 01/05/2016. FINDINGS: CTA CHEST FINDINGS Cardiovascular: Late but adequate contrast bolus timing in the pulmonary arterial tree. Superimposed respiratory motion and low lung volumes. There is no central or hilar pulmonary artery filling defect. But the bilateral pulmonary artery branches are not well evaluated despite repeated contrast bolus attempt. Negative thoracic aorta. Mild cardiomegaly is stable to improved since 2019. No pericardial effusion. No calcified coronary artery atherosclerosis is evident. Mediastinum/Nodes: Negative.  No mediastinal lymphadenopathy. Lungs/Pleura: Low lung volumes with patent major airways aside from atelectatic changes. Nonspecific scattered and asymmetric bilateral pulmonary ground-glass opacity with confluent peribronchial opacity in the superior segment of the right lower lobe (series 4, image 53). Other bilateral lower lobe mildly confluent peribronchial opacity more resembles atelectasis. No pleural effusion. Musculoskeletal: No acute or suspicious  osseous lesion  identified in the chest. Review of the MIP images confirms the above findings. CT ABDOMEN and PELVIS FINDINGS Hepatobiliary: Multiple large at least 6-7 millimeter areas of abnormally decreased and heterogeneous liver enhancement in the right lobe are poorly marginated and masslike. The right lobe appears mildly expanded compared to 2017. The left lobe may be spared. The gallbladder is grossly normal. There is a small volume of intermediate density free fluid adjacent to the liver compatible with hemoperitoneum. Pancreas: Negative. Spleen: Diminutive and negative. Adrenals/Urinary Tract: Adrenal glands appear normal. No hydronephrosis. Bilateral renal enhancement is symmetric and within normal limits but on delayed images there is minimal contrast excretion from both kidneys. The proximal ureters are decompressed. Diminutive and unremarkable urinary bladder. There is a small 2 millimeter upper pole right renal calculus which is new from 2017. Stomach/Bowel: Largely decompressed and negative large bowel from the splenic flexure distally. There may be mild sigmoid diverticulosis. Redundant proximal transverse colon with mild retained stool. Similar mild retained stool in the right colon. No large bowel inflammation is evident. Terminal ileum appears negative. Normal appendix suspected on coronal image 72. No dilated small bowel. Mildly fluid distended but otherwise negative stomach. No free air. Free fluid with intermediate density mostly around the liver and in both gutters, small volume overall. Vascular/Lymphatic: Arterial phase images of the abdomen and pelvis were included on the chest study. Abdominal aorta, its branches, iliac and proximal femoral arteries appear patent and within normal limits. On these images the portal venous system is not well evaluated. The main portal vein and SMV appear diminutive. Portal vein was also not well visualized on ultrasound earlier today. No lymphadenopathy. Reproductive:  Stable and negative allowing for adjacent hemoperitoneum. Other: Intermediate density pelvic free fluid such as due to hemoperitoneum (series 9, image 82). Volume is moderate and both lower quadrants are also affected. Musculoskeletal: Lower lumbar facet degeneration. No destructive or suspicious osseous lesion identified. Review of the MIP images confirms the above findings. IMPRESSION: 1. Suboptimal CTA chest for PE with no central or hilar pulmonary embolus. If there is strong clinical suspicion a pulmonary V/Q scan would be complementary to evaluate the segmental and distal branches. 2. Highly abnormal liver with multifocal infiltrative mass-like areas of abnormal enhancement in the right lobe and associated small volume of Hemoperitoneum in the abdomen and pelvis. Top differential considerations include hemorrhagic Hepatocellular Carcinoma or Hepatic Adenomas. These were not evident on a 2017 CT Abdomen and Pelvis. 3. Pulmonary atelectasis and additional nonspecific ground-glass opacity. Acute pulmonary infection or aspiration difficult to exclude. No pleural effusion. 4. Little renal contrast excretion on the delayed images, but no obstructive uropathy. Consider ATN or other intrinsic acute renal failure. 5. Cardiomegaly is stable or mildly improved since 2019. Study discussed by telephone with Dr. Dorie Rank on 08/12/2019 at 20:50 . Electronically Signed   By: Genevie Ann M.D.   On: 08/12/2019 20:56   US Abdomen Limited RUQ  Result Date: 08/12/2019 CLINICAL DATA:  Epigastric pain. EXAM: ULTRASOUND ABDOMEN LIMITED RIGHT UPPER QUADRANT COMPARISON:  Abdominopelvic CT 01/05/2016. Abdominal ultrasound 02/28/2015. FINDINGS: Gallbladder: No gallstones or wall thickening visualized. No sonographic Murphy sign noted by sonographer. Common bile duct: Diameter: Not visualized.  No biliary dilatation identified. Liver: The liver is very heterogeneous in echotexture. There are probable ill-defined masses within the left  lobe. A predominately hypoechoic mass measures approximately 10.2 x 9.1 x 8.5 cm. There is a more echogenic infiltrative mass in the left lobe measuring 6.9 x 5.6  x 8.7 cm. These lesions were not present previously. The portal vein is not well visualized. Other: Small amount of ascites. IMPRESSION: 1. New diffuse heterogeneity of the hepatic parenchyma with ill-defined masses in the left hepatic lobe, suspicious for malignancy. Further evaluation with abdominopelvic CT with contrast recommended. 2. The portal vein and common bile duct are not well visualized. No biliary dilatation identified. 3. The gallbladder appears normal. Electronically Signed   By: Richardean Sale M.D.   On: 08/12/2019 16:17     IMPRESSION:   *   Acute liver injury in setting of bleeding hepatic adenoma and shock.  *     AKI.  *     DKA  *    Sjogren's, SLE, on immunosuppression with Imuran, Plaquenil.  *   Chronic Plavix, hx CVA    PLAN:     *    Dr Hilarie Fredrickson is going to reach out to a liver transplant center as she may require specialized liver surgery.  Probably not a transplant candidate.     Azucena Freed  08/13/2019, 8:52 AM Phone [phone]

## 2019-08-13 NOTE — Progress Notes (Addendum)
PROGRESS NOTE    [name]  [id number] DOB: 02/06/1971 DOA: 08/12/2019 PCP: Bartholome Bill, MD  Outpatient Specialists: Dr. Loanne Drilling - Endocrinology Brief Narrative:  [name] is a 49 y.o. female with pmhx of Morbid obesity, Lupus, Sjogren's syndrome, CHF, DM c/b gastroparesis, CVA, HLD, OSA who presents with 2 days of severe nausea/vomiting and sudden onset abdominal pain.  Patient reports onset of symptoms was on 1/10, around 4:30 pm soon after eating beans.  She states within the hour she began to have severe nausea/vomiting, and has continued to suffer from this thru this time.  She denies any fevers, chills, diarrhea.  She reports she has had issues with nausea/vomiting with gastroparesis in the past but denies anything of this nature. She denies any dysuria, hematuria.  No constipation/diarrhea.  She has not been able to keep any meds down.  She denies any alcohol use.  Was found to be in DKA, also with AKI and dropping Hgb. CT showed bleeding into peritoneum by bleeding presumed adenoma. Has h/o renal mass with negative biopsy in 2014 and ablation and later resolution. Given gentle IV resuscitation given CHG hx but then dropped her pressure and needed IV fluid bolus. CCM asked to eval pt. For possible ICU admit, but came down and placed R IJ central line and BP was up following fluid bolus after that. Acidemia, slowly corrected on insulin gtt.  GI consulted and thought patient may need transfer to hepatic center and will get in touch with [facility].  Assessment & Plan:   Active Problems:   SLE   SJOGREN'S SYNDROME   HTN (hypertension)   Systolic CHF, acute on chronic (HCC)   Chronic respiratory failure with hypoxia (HCC)   Morbid obesity (HCC)   Liver mass   Diabetic gastroparesis (HCC)   Diabetes ([place])   Hepatic adenoma   Hemoperitoneum   Diabetic acidosis without coma (HCC)   Acute blood loss anemia  Nausea/Vomiting suspect 2/2 DKA T2DM/IDDM - unclear of  precipitant, previously was medication compliant, no infectious etiology  She has hx of gastroparesis and noted to have liver insult which may have precipitated this.   - continue insulin gtt, DKA protocol  AKI - suspect pre-renal vs ATN in setting of absent PO intake for 2 days, her renal function did worsen, but will continue to hydrate as per DKA protocol, likely severely intravascularly depleted - she does have hx of CHF, with improving EF, nonetheless at present she is dry, will hydrate, hold her diuretics - held Entresto due to ACEi - she received contrast so monitor for CIN over 48-72 hours - monitor I/O--likely worsening due to contrast - avoid nephrotoxic agents - given worsening, requested pharmD consult to further adjust doses of meds   Liver masses - seen on CT A/P with possible small amount of hemoperitoneum -hepatocellular injury with massively rising LFTs -may be related to shock due to low BP or necrosis in adenoma per GI - hx of liver masses in 2014, FNA from 03/25/2013 - "no cytomorphologic evidence of malignancy" and differential was hepatocellular adenoma vs focal nodular hyperplasia.  Per the patient she had repeat imaging in 6 months and was told it was benign (under Epic PMHx tab there is commentary about ablation, but she denies any hx of hep c or HCC) - interval imaging did not note these findings (I.e. CT from 2017) - do not suspect it is acutely leading to current presentation of N/V - will obtain chronic Hepatitis profile, denies  EtOH use, unexpected weight loss - d/w GI and IR, consider biopsy but caution if bleeding is concern (have held plavix and chemoprophylaxis for this reason)  Per GI, entire right lobe of liver is involved in multi-lobular adenoma and may need removal/transplant, given complicated picture with chronic immunosuppression, complex medical hx, they will seek transfer to [facility].  CHF - most recent Echo from 05/2018 - EF of 45-50% - she has a  mild rise in the HS troponin, no chest pain/pressure (only present after vomiting) - held diuretics - continue carvedilol, isosorbide-hydralazine, held entresto  Hx of CVA - held plavix due to finding of possible hemoperitoneum - continue pravastatin and dropping Hgb 11.9-->8.7 over the last 18 hours, with some hemodilution certainly  HTN - continue amlodipine, bidil and coreg--held if BP is low  HLD - continue pravastatin  Lupus SS - continue plaquenil, steroids, imuran  GERD - continue carafate  Depression - continue bupropion  Morbid Obesity - BMI [id number] - complicates all aspects of care  Polypharmacy - at risk of drug-drug interactions, pharmD assisting with renal dosing  DVT prophylaxis: held due to intraperitoneal bleed/SCDs Code Status: Full Family Communication: Patient Disposition Plan: unclear, pending w/u   Consultants:   GI  Procedures:   Right IJ  Antimicrobials: None  Subjective: Following BMP for acidemia correction. Serial CBGs. BP has been more stable with fluid resuscitation.  Objective: Vitals:   08/13/19 1330 08/13/19 1400 08/13/19 1430 08/13/19 1601  BP: (!) 97/27 90/71 (!) 104/29 136/85  Pulse:    (!) 110  Resp: (!) 21 (!) 26 (!) 25 18  Temp:    98.2 F (36.8 C)  TempSrc:    Oral  SpO2:    93%  Weight:      Height:        Intake/Output Summary (Last 24 hours) at 08/13/2019 1706 Last data filed at 08/13/2019 1401 Gross per 24 hour  Intake 2382.5 ml  Output --  Net 2382.5 ml   Filed Weights   08/13/19 0038  Weight: (!) 178.7 kg    Examination:  General exam: Morbidly obese, Sits straight up in bed. Respiratory system: crackles at bases bilaterally Cardiovascular system: tachy Gastrointestinal system: Abdomen is tender Central nervous system: Alert and oriented. No focal neurological deficits. Extremities: No clubbing Skin: No rashes, lesions or ulcers Psychiatry: Judgement and insight appear normal. Mood &  affect appropriate.     Data Reviewed: I have personally reviewed following labs and imaging studies  CBC: Recent Labs  Lab 08/12/19 1332 08/12/19 1523 08/13/19 0606 08/13/19 0811 08/13/19 1420  WBC 12.5*  --  16.0*  --   --   HGB 10.9* 11.9* 9.9* 9.0* 8.7*  HCT 35.6* 35.0* 32.4* 28.8* 28.2*  MCV 92.7  --  93.1  --   --   PLT 271  --  203  --   --    Basic Metabolic Panel: Recent Labs  Lab 08/12/19 1332 08/12/19 1506 08/12/19 1523 08/13/19 0200 08/13/19 0606 08/13/19 0900  NA 137  --  137 139 142 141  K 3.7  --  4.8 4.7 4.5 4.3  CL 102  --   --  108 109 110  CO2 19*  --   --  18* 18* 19*  GLUCOSE 454*  --   --  412* 215* 209*  BUN 10  --   --  19 22* 25*  CREATININE 1.28*  --   --  2.17* 2.82* 3.10*  CALCIUM 9.4  --   --  8.6* 9.0 8.7*  MG  --  1.8  --   --   --   --    GFR: Estimated Creatinine Clearance: 39.5 mL/min (A) (by C-G formula based on SCr of 3.1 mg/dL (H)). Liver Function Tests: Recent Labs  Lab 08/12/19 1332 08/13/19 0606  AST 260* 1,139*  ALT 160* 739*  ALKPHOS 76 84  BILITOT 0.4 0.8  PROT 6.6 7.0  ALBUMIN 3.1* 3.4*   Recent Labs  Lab 08/12/19 1332  LIPASE 18   Coagulation Profile: Recent Labs  Lab 08/13/19 0811  INR 1.2   CBG: Recent Labs  Lab 08/13/19 1058 08/13/19 1210 08/13/19 1320 08/13/19 1425 08/13/19 1554  GLUCAP 183* 159* 213* 174* 130*   Urine analysis:    Component Value Date/Time   COLORURINE RED (A) 08/12/2019 2210   APPEARANCEUR HAZY (A) 08/12/2019 2210   LABSPEC >1.046 (H) 08/12/2019 2210   PHURINE 6.0 08/12/2019 2210   GLUCOSEU >=500 (A) 08/12/2019 2210   HGBUR MODERATE (A) 08/12/2019 2210   BILIRUBINUR NEGATIVE 08/12/2019 2210   KETONESUR 5 (A) 08/12/2019 2210   PROTEINUR 100 (A) 08/12/2019 2210   UROBILINOGEN 0.2 02/27/2015 1733   NITRITE NEGATIVE 08/12/2019 2210   LEUKOCYTESUR NEGATIVE 08/12/2019 2210    Recent Results (from the past 240 hour(s))  Respiratory Panel by RT PCR (Flu A&B, Covid) -  Nasopharyngeal Swab     Status: None   Collection Time: 08/12/19  3:06 PM   Specimen: Nasopharyngeal Swab  Result Value Ref Range Status   SARS Coronavirus 2 by RT PCR NEGATIVE NEGATIVE Final    Comment: (NOTE) SARS-CoV-2 target nucleic acids are NOT DETECTED. The SARS-CoV-2 RNA is generally detectable in upper respiratoy specimens during the acute phase of infection. The lowest concentration of SARS-CoV-2 viral copies this assay can detect is 131 copies/mL. A negative result does not preclude SARS-Cov-2 infection and should not be used as the sole basis for treatment or other patient management decisions. A negative result may occur with  improper specimen collection/handling, submission of specimen other than nasopharyngeal swab, presence of viral mutation(s) within the areas targeted by this assay, and inadequate number of viral copies (<131 copies/mL). A negative result must be combined with clinical observations, patient history, and epidemiological information. The expected result is Negative. Fact Sheet for Patients:  PinkCheek.be Fact Sheet for Healthcare Providers:  GravelBags.it This test is not yet ap proved or cleared by the [place] FDA and  has been authorized for detection and/or diagnosis of SARS-CoV-2 by FDA under an Emergency Use Authorization (EUA). This EUA will remain  in effect (meaning this test can be used) for the duration of the COVID-19 declaration under Section 564(b)(1) of the Act, 21 U.S.C. section 360bbb-3(b)(1), unless the authorization is terminated or revoked sooner.    Influenza A by PCR NEGATIVE NEGATIVE Final   Influenza B by PCR NEGATIVE NEGATIVE Final    Comment: (NOTE) The Xpert Xpress SARS-CoV-2/FLU/RSV assay is intended as an aid in  the diagnosis of influenza from Nasopharyngeal swab specimens and  should not be used as a sole basis for treatment. Nasal washings and  aspirates  are unacceptable for Xpert Xpress SARS-CoV-2/FLU/RSV  testing. Fact Sheet for Patients: PinkCheek.be Fact Sheet for Healthcare Providers: GravelBags.it This test is not yet approved or cleared by the [place] FDA and  has been authorized for detection and/or diagnosis of SARS-CoV-2 by  FDA under an Emergency Use Authorization (EUA). This EUA will remain  in effect (meaning this test  can be used) for the duration of the  Covid-19 declaration under Section 564(b)(1) of the Act, 21  U.S.C. section 360bbb-3(b)(1), unless the authorization is  terminated or revoked. Performed at [facility], [place] [address], [place], [place] [address]          Radiology Studies: DG Chest 2 View  Result Date: 08/12/2019 CLINICAL DATA:  49 year old female with history of shortness of breath. EXAM: CHEST - 2 VIEW COMPARISON:  Chest x-ray 03/26/2018. FINDINGS: Lung volumes are low. Bibasilar opacities which may reflect areas of atelectasis and/or consolidation. No definite pleural effusions. No evidence of pulmonary edema. Mild cardiomegaly. Upper mediastinal contours are within normal limits. IMPRESSION: 1. Low lung volumes with bibasilar opacities which may be areas of atelectasis and/or consolidation. Electronically Signed   By: Vinnie Langton M.D.   On: 08/12/2019 16:17   CT Angio Chest PE W and/or Wo Contrast  Result Date: 08/12/2019 CLINICAL DATA:  49 year old female with shortness of breath. Nausea vomiting. Epigastric pain and abnormal abdomen ultrasound earlier today suspicious for hepatic metastatic disease. History of lupus. EXAM: CT ANGIOGRAPHY CHEST CT ABDOMEN AND PELVIS WITH CONTRAST TECHNIQUE: Multidetector CT imaging of the chest was performed using the standard protocol during bolus administration of intravenous contrast. Multiplanar CT image reconstructions and MIPs were obtained to evaluate the vascular anatomy. Multidetector  CT imaging of the abdomen and pelvis was performed using the standard protocol during bolus administration of intravenous contrast. CONTRAST:  160 milliliters OMNIPAQUE IOHEXOL 350 MG/ML SOLN, 91mL OMNIPAQUE IOHEXOL 350 MG/ML SOLN COMPARISON:  CTA chest 03/26/2018. CT Abdomen and Pelvis 01/05/2016. FINDINGS: CTA CHEST FINDINGS Cardiovascular: Late but adequate contrast bolus timing in the pulmonary arterial tree. Superimposed respiratory motion and low lung volumes. There is no central or hilar pulmonary artery filling defect. But the bilateral pulmonary artery branches are not well evaluated despite repeated contrast bolus attempt. Negative thoracic aorta. Mild cardiomegaly is stable to improved since 2019. No pericardial effusion. No calcified coronary artery atherosclerosis is evident. Mediastinum/Nodes: Negative.  No mediastinal lymphadenopathy. Lungs/Pleura: Low lung volumes with patent major airways aside from atelectatic changes. Nonspecific scattered and asymmetric bilateral pulmonary ground-glass opacity with confluent peribronchial opacity in the superior segment of the right lower lobe (series 4, image 53). Other bilateral lower lobe mildly confluent peribronchial opacity more resembles atelectasis. No pleural effusion. Musculoskeletal: No acute or suspicious osseous lesion identified in the chest. Review of the MIP images confirms the above findings. CT ABDOMEN and PELVIS FINDINGS Hepatobiliary: Multiple large at least 6-7 millimeter areas of abnormally decreased and heterogeneous liver enhancement in the right lobe are poorly marginated and masslike. The right lobe appears mildly expanded compared to 2017. The left lobe may be spared. The gallbladder is grossly normal. There is a small volume of intermediate density free fluid adjacent to the liver compatible with hemoperitoneum. Pancreas: Negative. Spleen: Diminutive and negative. Adrenals/Urinary Tract: Adrenal glands appear normal. No hydronephrosis.  Bilateral renal enhancement is symmetric and within normal limits but on delayed images there is minimal contrast excretion from both kidneys. The proximal ureters are decompressed. Diminutive and unremarkable urinary bladder. There is a small 2 millimeter upper pole right renal calculus which is new from 2017. Stomach/Bowel: Largely decompressed and negative large bowel from the splenic flexure distally. There may be mild sigmoid diverticulosis. Redundant proximal transverse colon with mild retained stool. Similar mild retained stool in the right colon. No large bowel inflammation is evident. Terminal ileum appears negative. Normal appendix suspected on coronal image 72.  No dilated small bowel. Mildly fluid distended but otherwise negative stomach. No free air. Free fluid with intermediate density mostly around the liver and in both gutters, small volume overall. Vascular/Lymphatic: Arterial phase images of the abdomen and pelvis were included on the chest study. Abdominal aorta, its branches, iliac and proximal femoral arteries appear patent and within normal limits. On these images the portal venous system is not well evaluated. The main portal vein and SMV appear diminutive. Portal vein was also not well visualized on ultrasound earlier today. No lymphadenopathy. Reproductive: Stable and negative allowing for adjacent hemoperitoneum. Other: Intermediate density pelvic free fluid such as due to hemoperitoneum (series 9, image 82). Volume is moderate and both lower quadrants are also affected. Musculoskeletal: Lower lumbar facet degeneration. No destructive or suspicious osseous lesion identified. Review of the MIP images confirms the above findings. IMPRESSION: 1. Suboptimal CTA chest for PE with no central or hilar pulmonary embolus. If there is strong clinical suspicion a pulmonary V/Q scan would be complementary to evaluate the segmental and distal branches. 2. Highly abnormal liver with multifocal  infiltrative mass-like areas of abnormal enhancement in the right lobe and associated small volume of Hemoperitoneum in the abdomen and pelvis. Top differential considerations include hemorrhagic Hepatocellular Carcinoma or Hepatic Adenomas. These were not evident on a 2017 CT Abdomen and Pelvis. 3. Pulmonary atelectasis and additional nonspecific ground-glass opacity. Acute pulmonary infection or aspiration difficult to exclude. No pleural effusion. 4. Little renal contrast excretion on the delayed images, but no obstructive uropathy. Consider ATN or other intrinsic acute renal failure. 5. Cardiomegaly is stable or mildly improved since 2019. Study discussed by telephone with Dr. Dorie Rank on 08/12/2019 at 20:50 . Electronically Signed   By: Genevie Ann M.D.   On: 08/12/2019 20:56   CT ABDOMEN PELVIS W CONTRAST  Result Date: 08/12/2019 CLINICAL DATA:  49 year old female with shortness of breath. Nausea vomiting. Epigastric pain and abnormal abdomen ultrasound earlier today suspicious for hepatic metastatic disease. History of lupus. EXAM: CT ANGIOGRAPHY CHEST CT ABDOMEN AND PELVIS WITH CONTRAST TECHNIQUE: Multidetector CT imaging of the chest was performed using the standard protocol during bolus administration of intravenous contrast. Multiplanar CT image reconstructions and MIPs were obtained to evaluate the vascular anatomy. Multidetector CT imaging of the abdomen and pelvis was performed using the standard protocol during bolus administration of intravenous contrast. CONTRAST:  160 milliliters OMNIPAQUE IOHEXOL 350 MG/ML SOLN, 71mL OMNIPAQUE IOHEXOL 350 MG/ML SOLN COMPARISON:  CTA chest 03/26/2018. CT Abdomen and Pelvis 01/05/2016. FINDINGS: CTA CHEST FINDINGS Cardiovascular: Late but adequate contrast bolus timing in the pulmonary arterial tree. Superimposed respiratory motion and low lung volumes. There is no central or hilar pulmonary artery filling defect. But the bilateral pulmonary artery branches are not  well evaluated despite repeated contrast bolus attempt. Negative thoracic aorta. Mild cardiomegaly is stable to improved since 2019. No pericardial effusion. No calcified coronary artery atherosclerosis is evident. Mediastinum/Nodes: Negative.  No mediastinal lymphadenopathy. Lungs/Pleura: Low lung volumes with patent major airways aside from atelectatic changes. Nonspecific scattered and asymmetric bilateral pulmonary ground-glass opacity with confluent peribronchial opacity in the superior segment of the right lower lobe (series 4, image 53). Other bilateral lower lobe mildly confluent peribronchial opacity more resembles atelectasis. No pleural effusion. Musculoskeletal: No acute or suspicious osseous lesion identified in the chest. Review of the MIP images confirms the above findings. CT ABDOMEN and PELVIS FINDINGS Hepatobiliary: Multiple large at least 6-7 millimeter areas of abnormally decreased and heterogeneous liver enhancement in the right  lobe are poorly marginated and masslike. The right lobe appears mildly expanded compared to 2017. The left lobe may be spared. The gallbladder is grossly normal. There is a small volume of intermediate density free fluid adjacent to the liver compatible with hemoperitoneum. Pancreas: Negative. Spleen: Diminutive and negative. Adrenals/Urinary Tract: Adrenal glands appear normal. No hydronephrosis. Bilateral renal enhancement is symmetric and within normal limits but on delayed images there is minimal contrast excretion from both kidneys. The proximal ureters are decompressed. Diminutive and unremarkable urinary bladder. There is a small 2 millimeter upper pole right renal calculus which is new from 2017. Stomach/Bowel: Largely decompressed and negative large bowel from the splenic flexure distally. There may be mild sigmoid diverticulosis. Redundant proximal transverse colon with mild retained stool. Similar mild retained stool in the right colon. No large bowel  inflammation is evident. Terminal ileum appears negative. Normal appendix suspected on coronal image 72. No dilated small bowel. Mildly fluid distended but otherwise negative stomach. No free air. Free fluid with intermediate density mostly around the liver and in both gutters, small volume overall. Vascular/Lymphatic: Arterial phase images of the abdomen and pelvis were included on the chest study. Abdominal aorta, its branches, iliac and proximal femoral arteries appear patent and within normal limits. On these images the portal venous system is not well evaluated. The main portal vein and SMV appear diminutive. Portal vein was also not well visualized on ultrasound earlier today. No lymphadenopathy. Reproductive: Stable and negative allowing for adjacent hemoperitoneum. Other: Intermediate density pelvic free fluid such as due to hemoperitoneum (series 9, image 82). Volume is moderate and both lower quadrants are also affected. Musculoskeletal: Lower lumbar facet degeneration. No destructive or suspicious osseous lesion identified. Review of the MIP images confirms the above findings. IMPRESSION: 1. Suboptimal CTA chest for PE with no central or hilar pulmonary embolus. If there is strong clinical suspicion a pulmonary V/Q scan would be complementary to evaluate the segmental and distal branches. 2. Highly abnormal liver with multifocal infiltrative mass-like areas of abnormal enhancement in the right lobe and associated small volume of Hemoperitoneum in the abdomen and pelvis. Top differential considerations include hemorrhagic Hepatocellular Carcinoma or Hepatic Adenomas. These were not evident on a 2017 CT Abdomen and Pelvis. 3. Pulmonary atelectasis and additional nonspecific ground-glass opacity. Acute pulmonary infection or aspiration difficult to exclude. No pleural effusion. 4. Little renal contrast excretion on the delayed images, but no obstructive uropathy. Consider ATN or other intrinsic acute renal  failure. 5. Cardiomegaly is stable or mildly improved since 2019. Study discussed by telephone with Dr. Dorie Rank on 08/12/2019 at 20:50 . Electronically Signed   By: Genevie Ann M.D.   On: 08/12/2019 20:56   DG Chest Portable 1 View  Result Date: 08/13/2019 CLINICAL DATA:  Post central line placement. EXAM: PORTABLE CHEST 1 VIEW COMPARISON:  Chest radiograph 08/12/2019 FINDINGS: Interval placement of a right IJ approach central venous catheter with tip projecting in the region of the superior vena cava. Overlying cardiac monitoring leads. Unchanged mild cardiomegaly. Shallow inspiration radiograph. Persistent bibasilar opacities which may reflect atelectasis or pneumonia. No definite pleural effusion or evidence of pneumothorax. IMPRESSION: Interval placement of a right IJ approach central venous catheter with tip projecting in the region of the superior vena cava. Persistent low lung volumes and bibasilar opacities which may reflect atelectasis and/or pneumonia. Electronically Signed   By: Kellie Simmering DO   On: 08/13/2019 09:17   US Abdomen Limited RUQ  Result Date: 08/12/2019 CLINICAL DATA:  Epigastric pain. EXAM: ULTRASOUND ABDOMEN LIMITED RIGHT UPPER QUADRANT COMPARISON:  Abdominopelvic CT 01/05/2016. Abdominal ultrasound 02/28/2015. FINDINGS: Gallbladder: No gallstones or wall thickening visualized. No sonographic Murphy sign noted by sonographer. Common bile duct: Diameter: Not visualized.  No biliary dilatation identified. Liver: The liver is very heterogeneous in echotexture. There are probable ill-defined masses within the left lobe. A predominately hypoechoic mass measures approximately 10.2 x 9.1 x 8.5 cm. There is a more echogenic infiltrative mass in the left lobe measuring 6.9 x 5.6 x 8.7 cm. These lesions were not present previously. The portal vein is not well visualized. Other: Small amount of ascites. IMPRESSION: 1. New diffuse heterogeneity of the hepatic parenchyma with ill-defined masses in  the left hepatic lobe, suspicious for malignancy. Further evaluation with abdominopelvic CT with contrast recommended. 2. The portal vein and common bile duct are not well visualized. No biliary dilatation identified. 3. The gallbladder appears normal. Electronically Signed   By: Richardean Sale M.D.   On: 08/12/2019 16:17        Scheduled Meds: . amLODipine  5 mg Oral Daily  . azaTHIOprine  100 mg Oral BID  . buPROPion  300 mg Oral Daily  . carvedilol  12.5 mg Oral BID WC  . cycloSPORINE  1 drop Both Eyes BID  . DULoxetine  120 mg Oral QHS  . famotidine  20 mg Oral BID  . hydroxychloroquine  200 mg Oral BID  . isosorbide-hydrALAZINE  2 tablet Oral TID  . loratadine  10 mg Oral Daily  . megestrol  40 mg Oral Daily  . mometasone-formoterol  2 puff Inhalation BID  . oxybutynin  15 mg Oral Daily  . pravastatin  10 mg Oral Daily  . predniSONE  3 mg Oral Daily  . pregabalin  75 mg Oral TID  . psyllium  1 packet Oral BID  . sodium chloride flush  10-40 mL Intracatheter Q12H  . sucralfate  1 g Oral BID  . topiramate  75 mg Oral QHS   Continuous Infusions: . sodium chloride Stopped (08/13/19 1401)  . sodium chloride 75 mL/hr at 08/13/19 1400  . dextrose 5 % and 0.45% NaCl 75 mL/hr at 08/13/19 0935  . insulin 5.5 Units/hr (08/13/19 1420)     LOS: 1 day   Donnamae Jude, MD [facility] Pager [phone]  If 7PM-7AM, please contact night-coverage [email or web address] Password [facility] 08/13/2019, 5:06 PM

## 2019-08-13 NOTE — Procedures (Signed)
Central Venous Catheter Insertion Procedure Note [name] [id number] [birth year]  Procedure: Insertion of Central Venous Catheter Indications: Assessment of intravascular volume, Drug and/or fluid administration and Frequent blood sampling  Procedure Details Consent: Risks of procedure as well as the alternatives and risks of each were explained to the (patient/caregiver).  Consent for procedure obtained. Time Out: Verified patient identification, verified procedure, site/side was marked, verified correct patient position, special equipment/implants available, medications/allergies/relevent history reviewed, required imaging and test results available.  Performed  Maximum sterile technique was used including antiseptics, cap, gloves, gown, hand hygiene, mask and sheet. Skin prep: Chlorhexidine; local anesthetic administered A antimicrobial bonded/coated triple lumen catheter was placed in the right internal jugular vein using the Seldinger technique. Ultrasound guidance used.Yes.        Catheter placed to 17 cm. Blood aspirated via all 3 ports and then flushed x 3. Line sutured x 2 and dressing applied.  Evaluation Blood flow good Complications: No apparent complications Patient did tolerate procedure well. Chest X-ray ordered to verify placement.  CXR: pending.  [name] Ewa Hipp ACNP Acute Care Nurse Practitioner [place] Please consult Amion 08/13/2019, 8:39 AM

## 2019-08-13 NOTE — ED Notes (Signed)
Report given to Diana RN

## 2019-08-13 NOTE — Consult Note (Addendum)
NAME:  [name], MRN:  [id number], DOB:  1971-06-06, LOS: 1 ADMISSION DATE:  08/12/2019, CONSULTATION DATE:  08/13/19 REFERRING MD:  Andria Frames, CHIEF COMPLAINT:  N/V   Brief History   49 year old woman with history of metabolic syndrome presenting with shock and multiorgan dysfunction.  History of present illness   49 year old woman with history of Lupus, Sjogren's, CHF, DM, CVA, HLD, OSA p/w N/V and abdominal pain.  Workup in ER revealed acute liver injury, acute kidney injury, metabolic acidosis with ketosis. Imaging shows a bleeding hepatic adenoma. Currently c/o diffuse abdominal pain, N/V, inability to take PO for a couple days   On 200 units humilin daily Also on imuran, plaquenil  Past Medical History  Lupus on plaquenil, Sjogren's, CVA, morbid obesity, IDDM, HLD, OSA, known hepatic adenoma  Significant Hospital Events   08/13/19 admission  Consults:  PCCM  Procedures:  08/13/19 Central line  Significant Diagnostic Tests:  CT C/A/P no PE, bleeding liver mass  Micro Data:  Urine Cx>> COVID neg  Antimicrobials:  N/A   Interim history/subjective:  Admitted  Objective   Blood pressure (!) 90/53, pulse (!) 138, temperature 97.8 F (36.6 C), temperature source Oral, resp. rate (!) 29, height 5\' 10"  (1.778 m), weight (!) 178.7 kg, last menstrual period 03/24/2017, SpO2 98 %.        Intake/Output Summary (Last 24 hours) at 08/13/2019 0804 Last data filed at 08/13/2019 0443 Gross per 24 hour  Intake 382.5 ml  Output --  Net 382.5 ml   Filed Weights   08/13/19 0038  Weight: (!) 178.7 kg    Examination: General: obese woman in NAD HENT: trachea midline, malampatti 4 Lungs: Diminished bases, no accessory muscle use Cardiovascular: Tachycardic, ext warm Abdomen: Soft but diffusely tender to palpation Extremities: No edema Neuro: Moves all 4 ext to command Psych: anxious, fair insight Skin: no rashes  Resolved Hospital Problem list   N/A  Assessment &  Plan:  # Acute liver injury due to bleeding hepatic adenoma- appearance on CT suggests this is acute on chronic # Acute kidney injury- due to poor PO # Ketoacidosis- due to poor PO, baseline IDDM, liver injury # Poor IV access  Prior CVA, morbid obesity, OSA, IDDM, Sjogrens/SLE on plaquenil/imuran  - Insulin gtt - Central line for access issues - Trend H/H - Check INR, low threshold for vitamin K - Hold aspirin, plavix and blood thinners - If H/H drops or goes into worsening shock, would ask IR to try angio-ablation; poor candidate for any procedure given baseline comorbidities - She is fine for whatever floor can handle endotool - Will be available as needed if she decompensates   Labs   CBC: Recent Labs  Lab 08/12/19 1332 08/12/19 1523 08/13/19 0606  WBC 12.5*  --  16.0*  HGB 10.9* 11.9* 9.9*  HCT 35.6* 35.0* 32.4*  MCV 92.7  --  93.1  PLT 271  --  123456    Basic Metabolic Panel: Recent Labs  Lab 08/12/19 1332 08/12/19 1506 08/12/19 1523 08/13/19 0200 08/13/19 0606  NA 137  --  137 139 142  K 3.7  --  4.8 4.7 4.5  CL 102  --   --  108 109  CO2 19*  --   --  18* 18*  GLUCOSE 454*  --   --  412* 215*  BUN 10  --   --  19 22*  CREATININE 1.28*  --   --  2.17* 2.82*  CALCIUM 9.4  --   --  8.6* 9.0  MG  --  1.8  --   --   --    GFR: Estimated Creatinine Clearance: 43.4 mL/min (A) (by C-G formula based on SCr of 2.82 mg/dL (H)). Recent Labs  Lab 08/12/19 1332 08/13/19 0606  WBC 12.5* 16.0*    Liver Function Tests: Recent Labs  Lab 08/12/19 1332 08/13/19 0606  AST 260* 1,139*  ALT 160* 739*  ALKPHOS 76 84  BILITOT 0.4 0.8  PROT 6.6 7.0  ALBUMIN 3.1* 3.4*   Recent Labs  Lab 08/12/19 1332  LIPASE 18   No results for input(s): AMMONIA in the last 168 hours.  ABG    Component Value Date/Time   PHART 7.324 (L) 09/20/2017 1624   PCO2ART 45.4 09/20/2017 1624   PO2ART 146.0 (H) 09/20/2017 1624   HCO3 16.1 (L) 08/12/2019 1523   TCO2 17 (L)  08/12/2019 1523   ACIDBASEDEF 10.0 (H) 08/12/2019 1523   O2SAT 91.0 08/12/2019 1523     Coagulation Profile: No results for input(s): INR, PROTIME in the last 168 hours.  Cardiac Enzymes: No results for input(s): CKTOTAL, CKMB, CKMBINDEX, TROPONINI in the last 168 hours.  HbA1C: Hemoglobin A1C  Date/Time Value Ref Range Status  07/18/2019 03:19 PM 9.4 (A) 4.0 - 5.6 % Final  01/01/2019 11:20 AM 11.7 (A) 4.0 - 5.6 % Final   Hgb A1c MFr Bld  Date/Time Value Ref Range Status  02/28/2015 02:00 PM 6.8 (H) 4.8 - 5.6 % Final    Comment:    (NOTE)         Pre-diabetes: 5.7 - 6.4         Diabetes: >6.4         Glycemic control for adults with diabetes: <7.0   01/25/2015 08:00 AM 7.1 (H) 4.8 - 5.6 % Final    Comment:    (NOTE)         Pre-diabetes: 5.7 - 6.4         Diabetes: >6.4         Glycemic control for adults with diabetes: <7.0     CBG: Recent Labs  Lab 08/13/19 0311 08/13/19 0433 08/13/19 0540 08/13/19 0652 08/13/19 0757  GLUCAP 281* 236* 208* 191* 204*    Review of Systems:    Positive Symptoms in bold:  Constitutional fevers, chills, weight loss, fatigue, anorexia, malaise  Eyes decreased vision, double vision, eye irritation  Ears, Nose, Mouth, Throat sore throat, trouble swallowing, sinus congestion  Cardiovascular chest pain, paroxysmal nocturnal dyspnea, lower ext edema, palpitations   Respiratory SOB, cough, DOE, hemoptysis, wheezing  Gastrointestinal nausea, vomiting, diarrhea  Genitourinary burning with urination, trouble urinating  Musculoskeletal joint aches, joint swelling, back pain  Integumentary  rashes, skin lesions  Neurological focal weakness, focal numbness, trouble speaking, headaches  Psychiatric depression, anxiety, confusion  Endocrine polyuria, polydipsia, cold intolerance, heat intolerance  Hematologic abnormal bruising, abnormal bleeding, unexplained nose bleeds  Allergic/Immunologic recurrent infections, hives, swollen lymph  nodes     Past Medical History  She,  has a past medical history of Acute renal failure ([place]), Anginal pain ([place]), Anxiety, Bell's palsy (08/09/2010), CAP (community acquired pneumonia) (10/2011), Chest pain (01/13/2014), CHF (congestive heart failure) ([place]), Diabetic gastroparesis associated with type 2 diabetes mellitus ([place]), Eczema, Family history of malignant neoplasm of breast, Family history of malignant neoplasm of ovary, GERD (gastroesophageal reflux disease), High cholesterol, Hypertension, Liver mass (2014), Lupus ([place]), Migraines, Obesity, Obstructive sleep apnea on CPAP (2011), On  home oxygen therapy, SJOGREN'S SYNDROME (08/09/2010), SLE (systemic lupus erythematosus) ([place]), and Stroke ([place]) (2010; 10/2012).   Surgical History    Past Surgical History:  Procedure Laterality Date  . BREAST CYST EXCISION Left 08/2005   epidermoid  . CARDIAC CATHETERIZATION  02/07/12  . ECTOPIC PREGNANCY SURGERY  1999  . ECTOPIC PREGNANCY SURGERY  1999  . ESOPHAGOGASTRODUODENOSCOPY N/A 02/26/2013   Procedure: ESOPHAGOGASTRODUODENOSCOPY (EGD);  Surgeon: Irene Shipper, MD;  Location[facility] ENDOSCOPY;  Service: Endoscopy;  Laterality: N/A;  . ESOPHAGOGASTRODUODENOSCOPY N/A 03/02/2015   Procedure: ESOPHAGOGASTRODUODENOSCOPY (EGD);  Surgeon: Milus Banister, MD;  Location: [place];  Service: Endoscopy;  Laterality: N/A;  . HERNIA REPAIR    . LEFT AND RIGHT HEART CATHETERIZATION WITH CORONARY ANGIOGRAM N/A 02/07/2012   Procedure: LEFT AND RIGHT HEART CATHETERIZATION WITH CORONARY ANGIOGRAM;  Surgeon: Laverda Page, MD;  Location: [facility] CATH LAB;  Service: Cardiovascular;  Laterality: N/A;  . LIVER BIOPSY  03/2013   liver mass/medical hx noted above  . MUSCLE BIOPSY     for lupus/notes 12/01/2010  . UMBILICAL HERNIA REPAIR  1980's     Social History   reports that she quit smoking about 28 years ago. Her smoking use included cigarettes. She has a 1.50 pack-year smoking history. She has never used smokeless tobacco.  She reports that she does not drink alcohol or use drugs.   Family History   Her family history includes Arthritis in an other family member; Asthma in her brother, brother, and mother; Breast cancer in her cousin and maternal aunt; Breast cancer (age of onset: 9) in her maternal aunt; Cancer in her maternal aunt, maternal aunt, and paternal aunt; Diabetes in her mother; Hypertension in her mother and another family member; Ovarian cancer in her maternal aunt; Ovarian cancer (age of onset: 70) in her sister; Prostate cancer in her maternal uncle and paternal uncle; Stomach cancer in her maternal uncle; Stroke in an other family member; Urticaria in her mother. There is no history of Allergic rhinitis or Angioedema.   Allergies Allergies  Allergen Reactions  . Metoclopramide Other (See Comments)    Developed restless leg, akathisia type limb movements.   . Codeine Itching, Rash and Other (See Comments)  . Penicillins Itching    Has patient had a PCN reaction causing immediate rash, facial/tongue/throat swelling, SOB or lightheadedness with hypotension: No Has patient had a PCN reaction causing severe rash involving mucus membranes or skin necrosis: No Has patient had a PCN reaction that required hospitalization: No Has patient had a PCN reaction occurring within the last 10 years:yes If all of the above answers are "NO", then may proceed with Cephalosporin use.  Marland Kitchen Hydrocodone Rash  . Percocet [Oxycodone-Acetaminophen] Hives and Rash    Tolerates dilaudid     Home Medications  Prior to Admission medications   Medication Sig Start Date End Date Taking? Authorizing Provider  albuterol (PROAIR HFA) 108 (90 BASE) MCG/ACT inhaler Inhale 2 puffs into the lungs every 6 (six) hours as needed for wheezing or shortness of breath.    Yes [provider]  albuterol (PROVENTIL) (2.5 MG/3ML) 0.083% nebulizer solution Take 2.5 mg by nebulization every 6 (six) hours as needed for wheezing.   Yes  [provider]  BIDIL 20-37.5 MG tablet TAKE TWO (2) TABLETS BY MOUTH THREE TIMES DAILY  Patient taking differently: Take 2 tablets by mouth 3 (three) times daily.  04/03/19  Yes Miquel Dunn, NP  Accu-Chek Softclix Lancets lancets Use to monitor  glucose levels 4 times per day; E11.9 10/19/18   Renato Shin, MD  ALPRAZolam Duanne Moron) 0.5 MG tablet Take 0.5 mg 2 (two) times daily as needed by mouth for anxiety or sleep.  04/19/17   [provider]  amLODipine (NORVASC) 5 MG tablet Take 1 tablet (5 mg total) by mouth daily. 10/19/18   Miquel Dunn, NP  aspirin EC 81 MG tablet Take 81 mg by mouth daily.    [provider]  azaTHIOprine (IMURAN) 50 MG tablet Take 100 mg by mouth 2 (two) times daily.     [provider]  B-D UF III MINI PEN NEEDLES 31G X 5 MM MISC USE AS DIRECTED TWICE A DAY 10/31/18   Renato Shin, MD  benzonatate (TESSALON) 100 MG capsule Take by mouth continuous as needed for cough.    [provider]  Blood Glucose Monitoring Suppl (ACCU-CHEK AVIVA) device Use as instructed 10/25/18 10/25/19  Philemon Kingdom, MD  buPROPion (WELLBUTRIN XL) 300 MG 24 hr tablet Take 300 mg by mouth daily. 06/28/19   [provider]  BYETTA 10 MCG PEN 10 MCG/0.04ML SOPN injection INJECT 0.04ML UNDER THE SKIN 2 TIMES DAILY WITH A MEAL 08/07/19   Renato Shin, MD  carvedilol (COREG) 12.5 MG tablet Take 12.5 mg by mouth 2 (two) times daily with a meal.     [provider]  cetirizine (ZYRTEC) 10 MG tablet Take 10 mg by mouth daily as needed for allergies.     [provider]  clopidogrel (PLAVIX) 75 MG tablet Take 75 mg by mouth daily.    [provider]  clotrimazole-betamethasone (LOTRISONE) cream Apply 1 application topically 2 (two) times daily. Patient taking differently: Apply 1 application topically as needed.  01/01/19   Renato Shin, MD  cyanocobalamin (,VITAMIN B-12,) 1000 MCG/ML injection Inject into the  muscle. Every other month 12/17/18   [provider]  cycloSPORINE (RESTASIS) 0.05 % ophthalmic emulsion Place 1 drop into both eyes 3 (three) times daily.    [provider]  diclofenac sodium (VOLTAREN) 1 % GEL Apply 2-4 g 4 (four) times daily as needed topically (joint pain). 06/06/17   Geradine Girt, DO  doxepin (SINEQUAN) 50 MG capsule Take 50-100 mg by mouth at bedtime as needed (sleep).     [provider]  DULoxetine (CYMBALTA) 60 MG capsule Take 120 mg by mouth at bedtime.     [provider]  EASY TOUCH PEN NEEDLES 32G X 4 MM MISC USE TO INJECT INSULIN TWICE DAILY 08/07/19   Renato Shin, MD  ENTRESTO 97-103 MG TAKE 1 TABLET BY MOUTH TWICE A DAY Patient taking differently: Take 1 tablet by mouth 2 (two) times daily.  06/04/19   Adrian Prows, MD  etonogestrel-ethinyl estradiol (NUVARING) 0.12-0.015 MG/24HR vaginal ring Place 1 each every 28 (twenty-eight) days vaginally. Insert vaginally and leave in place for 3 consecutive weeks, then remove for 1 week.    [provider]  famotidine (PEPCID) 20 MG tablet Take 20 mg by mouth 2 (two) times daily.    [provider]  fluticasone (FLONASE) 50 MCG/ACT nasal spray Place 2 sprays into both nostrils continuous as needed for allergies or rhinitis.    [provider]  glucose blood (ACCU-CHEK AVIVA PLUS) test strip Use to check blood sugar 4 times daily. **Needs appt for refills** 07/09/19   Elayne Snare, MD  hydroxychloroquine (PLAQUENIL) 200 MG tablet Take 200 mg by mouth 2 (two) times daily.  [provider]  insulin NPH Human (HUMULIN N) 100 UNIT/ML injection Inject 2 mLs (200 Units total) into the skin every morning. And syringes 3/day 07/18/19   Renato Shin, MD  Insulin Syringe-Needle U-100 (EASY TOUCH INSULIN SYRINGE) 31G X 5/16" 1 ML MISC USE AS DIRECTED THREE TIMES A DAY 10/12/18   Renato Shin, MD  megestrol (MEGACE) 40 MG tablet Take 40 mg by mouth daily. 01/30/15    [provider]  metFORMIN (GLUCOPHAGE-XR) 500 MG 24 hr tablet Take 1,000 mg by mouth 2 (two) times daily.    [provider]  mometasone-formoterol (DULERA) 100-5 MCG/ACT AERO Inhale 2 puffs into the lungs 2 (two) times daily. 05/29/17   [provider]  oxybutynin (DITROPAN-XL) 10 MG 24 hr tablet Take 15 mg by mouth daily.  08/30/17   [provider]  Polyvinyl Alcohol-Povidone (REFRESH OP) Place 1 drop 2 (two) times daily into both eyes.     [provider]  pravastatin (PRAVACHOL) 10 MG tablet Take 10 mg by mouth daily.    [provider]  predniSONE (DELTASONE) 1 MG tablet Take 3 mg by mouth daily.  04/21/17   [provider]  pregabalin (LYRICA) 75 MG capsule Take 75 mg 3 (three) times daily by mouth.  01/28/15   [provider]  psyllium (METAMUCIL MULTIHEALTH FIBER) 58.6 % powder Take 1 packet by mouth 2 (two) times daily.    [provider]  spironolactone (ALDACTONE) 50 MG tablet TAKE 1 TABLET BY MOUTH EVERY DAY Patient taking differently: Take 50 mg by mouth daily.  04/09/19   Miquel Dunn, NP  sucralfate (CARAFATE) 1 GM/10ML suspension Take 1 g by mouth 2 (two) times daily. 07/08/19   [provider]  topiramate (TOPAMAX) 25 MG tablet Take 75 mg by mouth at bedtime. 03/17/16   [provider]  torsemide (DEMADEX) 20 MG tablet 40 mg in AM and 20 mg in PM Patient taking differently: Take 20-40 mg by mouth See admin instructions. Take 2 tablet (40 mg) by mouth every morning and 1 tablet (20 mg) at night 09/23/17   Debbe Odea, MD  traMADol (ULTRAM) 50 MG tablet TAKE 1 TO 2 TABLETS BY MOUTH EVERY 6 HOURS AS NEEDED FOR PAIN**PA REQ** Patient taking differently: Take 50-100 mg by mouth every 6 (six) hours as needed (pain).  01/21/19   Pete Pelt, PA-C  Vitamin D, Ergocalciferol, (DRISDOL) 1.25 MG (50000 UNIT) CAPS capsule Take 50,000 Units by mouth every 7 (seven) days.    [provider]

## 2019-08-13 NOTE — ED Notes (Signed)
Attempt to give report.

## 2019-08-13 NOTE — Progress Notes (Addendum)
Inpatient Diabetes Program Recommendations  AACE/ADA: New Consensus Statement on Inpatient Glycemic Control (2015)  Target Ranges:  Prepandial:   less than 140 mg/dL      Peak postprandial:   less than 180 mg/dL (1-2 hours)      Critically ill patients:  140 - 180 mg/dL   Lab Results  Component Value Date   GLUCAP 204 (H) 08/13/2019   HGBA1C 9.4 (A) 07/18/2019    Review of Glycemic Control Results for JAMAR, RUMMELL (MRN [id number]) as of 08/13/2019 08:55  Ref. Range 08/13/2019 05:40 08/13/2019 06:52 08/13/2019 07:57  Glucose-Capillary Latest Ref Range: 70 - 99 mg/dL 208 (H) 191 (H) 204 (H)   Diabetes history: Type 2 DM Outpatient Diabetes medications: NPH 200 units QAM, Metformin 1000 mg BID, Byetta 0.04 mg BID Current orders for Inpatient glycemic control: IV insulin  Inpatient Diabetes Program Recommendations:    When ready for transition consider:  -Levemir 40 units two hours PRIOR to stopping IV insulin, then BID to follow - Novolog 0-9 units Q4H  Noted patient is followed by Dr Loanne Drilling with last appointment on 12/17. Will need to follow up with this patient when appropriate.   Thanks, Bronson Curb, MSN, RNC-OB Diabetes Coordinator [phone] (8a-5p)

## 2019-08-13 NOTE — Progress Notes (Signed)
PHARMACY NOTE -  RENAL DOSE ADJUSTMENT   Request received for Pharmacy to assist with renal dose adjustment for current meds.   SCr 2.17 (baseline ~1), estimated CrCl 55 ml/min  Azathioprine could be decreased to 75% of current dose if CrCl worsens to <50. Will decrease Lovenox to 40mg  if CrCl worsens to <30. Pregabalin may need to be decreased by ~50% (same frequency). Topiramate may need to be decreased by 50%, though current dose is low enough that if renal function starts to resolve there will likely be no issue.  Will continue to monitor any meds added.   Wynona Neat, PharmD, BCPS 08/13/2019 3:28 AM

## 2019-08-13 NOTE — Plan of Care (Signed)

## 2019-08-14 ENCOUNTER — Inpatient Hospital Stay (HOSPITAL_COMMUNITY): Payer: Medicaid Other

## 2019-08-14 DIAGNOSIS — D62 Acute posthemorrhagic anemia: Secondary | ICD-10-CM

## 2019-08-14 LAB — GLUCOSE, CAPILLARY
Glucose-Capillary: 149 mg/dL — ABNORMAL HIGH (ref 70–99)
Glucose-Capillary: 156 mg/dL — ABNORMAL HIGH (ref 70–99)
Glucose-Capillary: 156 mg/dL — ABNORMAL HIGH (ref 70–99)
Glucose-Capillary: 159 mg/dL — ABNORMAL HIGH (ref 70–99)
Glucose-Capillary: 162 mg/dL — ABNORMAL HIGH (ref 70–99)
Glucose-Capillary: 163 mg/dL — ABNORMAL HIGH (ref 70–99)
Glucose-Capillary: 165 mg/dL — ABNORMAL HIGH (ref 70–99)
Glucose-Capillary: 166 mg/dL — ABNORMAL HIGH (ref 70–99)
Glucose-Capillary: 173 mg/dL — ABNORMAL HIGH (ref 70–99)
Glucose-Capillary: 174 mg/dL — ABNORMAL HIGH (ref 70–99)
Glucose-Capillary: 175 mg/dL — ABNORMAL HIGH (ref 70–99)
Glucose-Capillary: 180 mg/dL — ABNORMAL HIGH (ref 70–99)
Glucose-Capillary: 180 mg/dL — ABNORMAL HIGH (ref 70–99)
Glucose-Capillary: 189 mg/dL — ABNORMAL HIGH (ref 70–99)
Glucose-Capillary: 198 mg/dL — ABNORMAL HIGH (ref 70–99)
Glucose-Capillary: 198 mg/dL — ABNORMAL HIGH (ref 70–99)
Glucose-Capillary: 201 mg/dL — ABNORMAL HIGH (ref 70–99)
Glucose-Capillary: 222 mg/dL — ABNORMAL HIGH (ref 70–99)
Glucose-Capillary: 236 mg/dL — ABNORMAL HIGH (ref 70–99)
Glucose-Capillary: 263 mg/dL — ABNORMAL HIGH (ref 70–99)

## 2019-08-14 LAB — CBC WITH DIFFERENTIAL/PLATELET
Abs Immature Granulocytes: 0.13 10*3/uL — ABNORMAL HIGH (ref 0.00–0.07)
Basophils Absolute: 0 10*3/uL (ref 0.0–0.1)
Basophils Relative: 0 %
Eosinophils Absolute: 0 10*3/uL (ref 0.0–0.5)
Eosinophils Relative: 0 %
HCT: 25.8 % — ABNORMAL LOW (ref 36.0–46.0)
Hemoglobin: 7.9 g/dL — ABNORMAL LOW (ref 12.0–15.0)
Immature Granulocytes: 1 %
Lymphocytes Relative: 11 %
Lymphs Abs: 1.5 10*3/uL (ref 0.7–4.0)
MCH: 28.5 pg (ref 26.0–34.0)
MCHC: 30.6 g/dL (ref 30.0–36.0)
MCV: 93.1 fL (ref 80.0–100.0)
Monocytes Absolute: 1.5 10*3/uL — ABNORMAL HIGH (ref 0.1–1.0)
Monocytes Relative: 10 %
Neutro Abs: 11.4 10*3/uL — ABNORMAL HIGH (ref 1.7–7.7)
Neutrophils Relative %: 78 %
Platelets: 139 10*3/uL — ABNORMAL LOW (ref 150–400)
RBC: 2.77 MIL/uL — ABNORMAL LOW (ref 3.87–5.11)
RDW: 14.2 % (ref 11.5–15.5)
WBC: 14.5 10*3/uL — ABNORMAL HIGH (ref 4.0–10.5)
nRBC: 0.4 % — ABNORMAL HIGH (ref 0.0–0.2)

## 2019-08-14 LAB — BASIC METABOLIC PANEL
Anion gap: 8 (ref 5–15)
BUN: 37 mg/dL — ABNORMAL HIGH (ref 6–20)
CO2: 21 mmol/L — ABNORMAL LOW (ref 22–32)
Calcium: 8.2 mg/dL — ABNORMAL LOW (ref 8.9–10.3)
Chloride: 111 mmol/L (ref 98–111)
Creatinine, Ser: 4.49 mg/dL — ABNORMAL HIGH (ref 0.44–1.00)
GFR calc Af Amer: 13 mL/min — ABNORMAL LOW (ref >60)
GFR calc non Af Amer: 11 mL/min — ABNORMAL LOW (ref >60)
Glucose, Bld: 184 mg/dL — ABNORMAL HIGH (ref 70–99)
Potassium: 4.4 mmol/L (ref 3.5–5.1)
Sodium: 140 mmol/L (ref 135–145)

## 2019-08-14 LAB — URINE CULTURE: Culture: >=100000 — AB

## 2019-08-14 LAB — CBC
HCT: 25.6 % — ABNORMAL LOW (ref 36.0–46.0)
Hemoglobin: 7.9 g/dL — ABNORMAL LOW (ref 12.0–15.0)
MCH: 28.8 pg (ref 26.0–34.0)
MCHC: 30.9 g/dL (ref 30.0–36.0)
MCV: 93.4 fL (ref 80.0–100.0)
Platelets: 138 10*3/uL — ABNORMAL LOW (ref 150–400)
RBC: 2.74 MIL/uL — ABNORMAL LOW (ref 3.87–5.11)
RDW: 14.2 % (ref 11.5–15.5)
WBC: 14.4 10*3/uL — ABNORMAL HIGH (ref 4.0–10.5)
nRBC: 0.3 % — ABNORMAL HIGH (ref 0.0–0.2)

## 2019-08-14 LAB — HCV INTERPRETATION

## 2019-08-14 LAB — BLOOD GAS, ARTERIAL
Acid-base deficit: 7.5 mmol/L — ABNORMAL HIGH (ref 0.0–2.0)
Bicarbonate: 18 mmol/L — ABNORMAL LOW (ref 20.0–28.0)
Drawn by: 350431
FIO2: 28
O2 Saturation: 96 %
Patient temperature: 36.8
pCO2 arterial: 38.9 mmHg (ref 32.0–48.0)
pH, Arterial: 7.285 — ABNORMAL LOW (ref 7.350–7.450)
pO2, Arterial: 88.3 mmHg (ref 83.0–108.0)

## 2019-08-14 LAB — HEMOGLOBIN AND HEMATOCRIT, BLOOD
HCT: 25.2 % — ABNORMAL LOW (ref 36.0–46.0)
Hemoglobin: 7.8 g/dL — ABNORMAL LOW (ref 12.0–15.0)

## 2019-08-14 LAB — HEPATITIS B SURFACE ANTIBODY, QUANTITATIVE: Hep B S AB Quant (Post): <3.1 m[IU]/mL — ABNORMAL LOW (ref >9.9)

## 2019-08-14 LAB — HEPATIC FUNCTION PANEL
ALT: 487 U/L — ABNORMAL HIGH (ref 0–44)
AST: 537 U/L — ABNORMAL HIGH (ref 15–41)
Albumin: 3 g/dL — ABNORMAL LOW (ref 3.5–5.0)
Alkaline Phosphatase: 93 U/L (ref 38–126)
Bilirubin, Direct: 0.2 mg/dL (ref 0.0–0.2)
Indirect Bilirubin: 0.6 mg/dL (ref 0.3–0.9)
Total Bilirubin: 0.8 mg/dL (ref 0.3–1.2)
Total Protein: 6.2 g/dL — ABNORMAL LOW (ref 6.5–8.1)

## 2019-08-14 LAB — HCV AB W REFLEX TO QUANT PCR: HCV Ab: 0.1 s/co ratio (ref 0.0–0.9)

## 2019-08-14 MED ORDER — SODIUM BICARBONATE 8.4 % IV SOLN
INTRAVENOUS | Status: DC
  Filled 2019-08-14: qty 150

## 2019-08-14 MED ORDER — SODIUM CHLORIDE 0.9 % IV BOLUS
1000.0000 mL | Freq: Once | INTRAVENOUS | Status: AC
  Administered 2019-08-14: 11:00:00 1000 mL via INTRAVENOUS

## 2019-08-14 MED ORDER — STERILE WATER FOR INJECTION IV SOLN
Freq: Once | INTRAVENOUS | Status: DC
  Filled 2019-08-14: qty 850

## 2019-08-14 MED ORDER — FAMOTIDINE 20 MG PO TABS
20.0000 mg | ORAL_TABLET | Freq: Every day | ORAL | Status: DC
  Administered 2019-08-15 – 2019-08-18 (×4): 20 mg via ORAL
  Filled 2019-08-14 (×4): qty 1

## 2019-08-14 MED ORDER — PREGABALIN 75 MG PO CAPS
75.0000 mg | ORAL_CAPSULE | Freq: Two times a day (BID) | ORAL | Status: DC
  Administered 2019-08-15 – 2019-08-18 (×8): 75 mg via ORAL
  Filled 2019-08-14 (×8): qty 1

## 2019-08-14 MED ORDER — SODIUM BICARBONATE-DEXTROSE 150-5 MEQ/L-% IV SOLN
150.0000 meq | INTRAVENOUS | Status: DC
  Administered 2019-08-14 – 2019-08-16 (×2): 150 meq via INTRAVENOUS
  Filled 2019-08-14 (×3): qty 1000

## 2019-08-14 NOTE — Progress Notes (Signed)
Attempted MRI, due to body habitus pt unable to fit in MR scanner.

## 2019-08-14 NOTE — Progress Notes (Addendum)
Attempted to perform but unable to do in & out cath at this time. Pt is having some vaginal bleeding. Will pass on to day shift. Will continue to monitor  Fransico Michael, RN

## 2019-08-14 NOTE — Progress Notes (Addendum)
Rapid Response Rounding Note  Pt found to be lying in bed. Pt lethargic, but responds to persistent verbal and painful stimuli. Pt does not sustain eye opening, but does eventually engage in minimal verbal response. Pt moves all extremities to command. Pt breathing is irregular in that her exhalation is long, but pulse oximetry reading is 100% on 2LNC. RR 12-20. No increased work of breathing, minimal accessory muscle use. Pt has had minimal UOP and staff was unsuccessful with attempt to I&O cath pt.  High aspiration risk and high risk for intubation. Low threshold for transfer to ICU.  Plan: -Oral suction set up at the bedside. -End tidal CO2 monitoring via nasal cannula placed. Pt reading End tidal CO2 30-32. -RN educated to administer Narcan if pt becomes harder or unable to be aroused or if respiratory rate or oxygen saturation declines. RN to also call rapid response if this event occurs. -Consider foley catheter placement due to in and out cath being unsuccessful and pt having diminished UOP.  -Clarify order with MD for sodium bicarb gtt related to pt being on EndoTool and dextrose currently mixed in pt's fluids.

## 2019-08-14 NOTE — Evaluation (Signed)
Occupational Therapy Evaluation Patient Details Name: [name] MRN: [id number] DOB: Dec 03, 1970 Today's Date: 08/14/2019    History of Present Illness Pt is a 49 yo female presenting with nausea and vomiting, found to have acute liver and kidney injury, metabolic acidosis, and hepatic adnenoma. PMH includes morbid obesity, DM, lupus, CVA, OSA, chronic pain.   Clinical Impression   PTA pt living at home with roommate, reports that she is independent with functional mobility and BADL. At time of eval, she is min A +2 for bed mobility and mod A for sit <> stands. She is minimally responsive to questions, suspect self limiting throughout evaluation. Pt is on 2L O2 chronically, VSS throughout session. She reports persistent nausea that limits progression with BADL. Given current status, recommend HHOT at d/c for safe progression of BADL in home environment. Pt may t/f to [facility] per chart review. Will continue to follow per POC listed below.     Follow Up Recommendations  Home health OT;Supervision/Assistance - 24 hour;Other (comment)(pt may t/f to [facility])    Equipment Recommendations  3 in 1 bedside commode;Wheelchair (measurements OT);Wheelchair cushion (measurements OT);Other (comment)(bariatric)    Recommendations for Other Services       Precautions / Restrictions Precautions Precautions: Fall Restrictions Weight Bearing Restrictions: No      Mobility Bed Mobility Overal bed mobility: Needs Assistance Bed Mobility: Rolling;Supine to Sit;Sit to Supine Rolling: +2 for physical assistance;Min assist   Supine to sit: Min assist;+2 for physical assistance Sit to supine: Min assist;+2 for physical assistance   General bed mobility comments: min A +2 for BLE translation and truncal support, cues for sequencing needed  Transfers Overall transfer level: Needs assistance Equipment used: 1 person hand held assist Transfers: Sit to/from Stand Sit to Stand: Mod assist        Lateral/Scoot Transfers: Mod assist General transfer comment: mod A to rise and steady, increased effort    Balance Overall balance assessment: Needs assistance Sitting-balance support: Bilateral upper extremity supported;Feet unsupported Sitting balance-Leahy Scale: Fair         Standing balance comment: unable                           ADL either performed or assessed with clinical judgement   ADL Overall ADL's : Needs assistance/impaired Eating/Feeding: NPO   Grooming: Set up;Sitting   Upper Body Bathing: Minimal assistance;Sitting   Lower Body Bathing: Moderate assistance;Sit to/from stand;Sitting/lateral leans   Upper Body Dressing : Minimal assistance;Sitting   Lower Body Dressing: Moderate assistance;Sitting/lateral leans;Sit to/from stand   Toilet Transfer: Moderate assistance;Ambulation;RW;BSC   Toileting- Clothing Manipulation and Hygiene: Moderate assistance;Sit to/from stand   Tub/ Banker: Moderate assistance;Shower seat;Ambulation;Rolling walker   Functional mobility during ADLs: Moderate assistance(side steps up in bed only) General ADL Comments: pt limited by decreased activity tolerance and generalized weakness for BADL     Vision Patient Visual Report: No change from baseline       Perception     Praxis      Pertinent Vitals/Pain Pain Assessment: Faces Faces Pain Scale: Hurts little more Pain Location: general, may be due to general malaise Pain Descriptors / Indicators: Grimacing;Sore Pain Intervention(s): Limited activity within patient's tolerance;Monitored during session;Repositioned     Hand Dominance Right   Extremity/Trunk Assessment Upper Extremity Assessment Upper Extremity Assessment: Generalized weakness   Lower Extremity Assessment Lower Extremity Assessment: Defer to PT evaluation   Cervical / Trunk Assessment Cervical / Trunk Assessment:  Normal   Communication Communication Communication: No  difficulties   Cognition Arousal/Alertness: Awake/alert Behavior During Therapy: Flat affect;WFL for tasks assessed/performed Overall Cognitive Status: Difficult to assess                                 General Comments: pt minimally responsive, seems oriented with information given, but difficult to perform thorough cog assessment   General Comments       Exercises     Shoulder Instructions      Home Living Family/patient expects to be discharged to:: Private residence Living Arrangements: Non-relatives/Friends Available Help at Discharge: Family Type of Home: House Home Access: Stairs to enter Technical brewer of Steps: 4+4 Entrance Stairs-Rails: Left Home Layout: One level     Bathroom Shower/Tub: Teacher, early years/pre: Handicapped height     Home Equipment: [place] held shower head;Bedside commode;Cane - single point          Prior Functioning/Environment Level of Independence: Independent        Comments: driving at baseline, does not work. Not currently using AD        OT Problem List: Decreased strength;Decreased knowledge of use of DME or AE;Decreased activity tolerance;Cardiopulmonary status limiting activity;Impaired balance (sitting and/or standing)      OT Treatment/Interventions: Self-care/ADL training;Therapeutic exercise;Patient/family education;Balance training;Energy conservation;Therapeutic activities;DME and/or AE instruction    OT Goals(Current goals can be found in the care plan section) Acute Rehab OT Goals Patient Stated Goal: return home OT Goal Formulation: With patient Time For Goal Achievement: 08/28/19 Potential to Achieve Goals: Good  OT Frequency: Min 2X/week   Barriers to D/C:            Co-evaluation              AM-PAC OT "6 Clicks" Daily Activity     Outcome Measure Help from another person eating meals?: A Little Help from another person taking care of personal  grooming?: A Little Help from another person toileting, which includes using toliet, bedpan, or urinal?: A Lot Help from another person bathing (including washing, rinsing, drying)?: A Lot Help from another person to put on and taking off regular upper body clothing?: A Little Help from another person to put on and taking off regular lower body clothing?: A Lot 6 Click Score: 15   End of Session Nurse Communication: Mobility status  Activity Tolerance: Patient tolerated treatment well Patient left: in bed;with call bell/phone within reach;with bed alarm set  OT Visit Diagnosis: Unsteadiness on feet (R26.81);Other abnormalities of gait and mobility (R26.89);Muscle weakness (generalized) (M62.81)                Time: [id number] OT Time Calculation (min): 35 min Charges:  OT General Charges $OT Visit: 1 Visit OT Evaluation $OT Eval Moderate Complexity: 1 Mod  Zenovia Jarred, MSOT, OTR/L Acute Rehabilitation Services [facility] Office Number: [phone]  Zenovia Jarred 08/14/2019, 4:51 PM

## 2019-08-14 NOTE — Progress Notes (Signed)
Daily Rounding Note  08/14/2019, 8:44 AM  LOS: 2 days   SUBJECTIVE:   Chief complaint: liver masses.  Elevated liver enzymes  RR team called in at 0430 today for lethargy and changes in breathing.  All attributed to dilaudid, obesity.  Did not administer Narcan, did not meet criteria for Bipap.    Diminished urine output w 165 mL residual urine on bladder scan (caveat obesity may limit study).  Do not see any urine output entered yest or today.    RN noted vaginal bleeding/menses per 0630 note.    Pt denies abd pain, SOB, nausea.      OBJECTIVE:         Vital signs in last 24 hours:    Temp:  [98.2 F (36.8 C)-99.1 F (37.3 C)] 99.1 F (37.3 C) (01/13 0800) Pulse Rate:  [100-120] 100 (01/13 0800) Resp:  [14-26] 20 (01/13 0800) BP: (90-145)/(27-94) 145/94 (01/13 0800) SpO2:  [93 %-100 %] 99 % (01/13 0800) Weight:  [190.9 kg] 190.9 kg (01/13 0419) Last BM Date: 08/12/19 Filed Weights   08/13/19 0038 08/14/19 0419  Weight: (!) 178.7 kg (!) 190.9 kg   General:  Sleepy, arouses with voice and gentle shake.  comfortable  Heart: RRR Chest: resp pattern with pursed lip blowing expiration, accessory muscle use.  No cough.  No adventitious sounds in front Abdomen: soft, NT, active BS  Extremities: LE edema.   Neuro/Psych:  Increased somnolence not staying alert as long as yesterday. Follows commands and squeezes fist and moves toes symmetrically.     Intake/Output from previous day: 01/12 0701 - 01/13 0700 In: 3606.9 [P.O.:240; I.V.:2366.9; IV Piggyback:1000] Out: -   Intake/Output this shift: No intake/output data recorded.  Lab Results: Recent Labs    08/13/19 0606 08/14/19 0157 08/14/19 0412 08/14/19 0530  WBC 16.0*  --  14.4* 14.5*  HGB 9.9* 7.8* 7.9* 7.9*  HCT 32.4* 25.2* 25.6* 25.8*  PLT 203  --  138* 139*   BMET Recent Labs    08/13/19 0606 08/13/19 0900 08/13/19 1640  NA 142 141 143  K 4.5  4.3 4.4  CL 109 110 113*  CO2 18* 19* 19*  GLUCOSE 215* 209* 188*  BUN 22* 25* 29*  CREATININE 2.82* 3.10* 3.68*  CALCIUM 9.0 8.7* 8.4*   LFT Recent Labs    08/12/19 1332 08/13/19 0606 08/14/19 0412  PROT 6.6 7.0 6.2*  ALBUMIN 3.1* 3.4* 3.0*  AST 260* 1,139* 537*  ALT 160* 739* 487*  ALKPHOS 76 84 93  BILITOT 0.4 0.8 0.8  BILIDIR  --   --  0.2  IBILI  --   --  0.6   PT/INR Recent Labs    08/13/19 0811  LABPROT 15.5*  INR 1.2   Hepatitis Panel Recent Labs    08/13/19 0606  HEPBSAG NON REACTIVE    Studies/Results: DG Chest 2 View  Result Date: 08/12/2019 CLINICAL DATA:  49 year old female with history of shortness of breath. EXAM: CHEST - 2 VIEW COMPARISON:  Chest x-ray 03/26/2018. FINDINGS: Lung volumes are low. Bibasilar opacities which may reflect areas of atelectasis and/or consolidation. No definite pleural effusions. No evidence of pulmonary edema. Mild cardiomegaly. Upper mediastinal contours are within normal limits. IMPRESSION: 1. Low lung volumes with bibasilar opacities which may be areas of atelectasis and/or consolidation. Electronically Signed   By: Vinnie Langton M.D.   On: 08/12/2019 16:17   CT Angio Chest PE W and/or Wo Contrast  Result Date: 08/12/2019 CLINICAL DATA:  49 year old female with shortness of breath. Nausea vomiting. Epigastric pain and abnormal abdomen ultrasound earlier today suspicious for hepatic metastatic disease. History of lupus. EXAM: CT ANGIOGRAPHY CHEST CT ABDOMEN AND PELVIS WITH CONTRAST TECHNIQUE: Multidetector CT imaging of the chest was performed using the standard protocol during bolus administration of intravenous contrast. Multiplanar CT image reconstructions and MIPs were obtained to evaluate the vascular anatomy. Multidetector CT imaging of the abdomen and pelvis was performed using the standard protocol during bolus administration of intravenous contrast. CONTRAST:  160 milliliters OMNIPAQUE IOHEXOL 350 MG/ML SOLN, 14mL  OMNIPAQUE IOHEXOL 350 MG/ML SOLN COMPARISON:  CTA chest 03/26/2018. CT Abdomen and Pelvis 01/05/2016. FINDINGS: CTA CHEST FINDINGS Cardiovascular: Late but adequate contrast bolus timing in the pulmonary arterial tree. Superimposed respiratory motion and low lung volumes. There is no central or hilar pulmonary artery filling defect. But the bilateral pulmonary artery branches are not well evaluated despite repeated contrast bolus attempt. Negative thoracic aorta. Mild cardiomegaly is stable to improved since 2019. No pericardial effusion. No calcified coronary artery atherosclerosis is evident. Mediastinum/Nodes: Negative.  No mediastinal lymphadenopathy. Lungs/Pleura: Low lung volumes with patent major airways aside from atelectatic changes. Nonspecific scattered and asymmetric bilateral pulmonary ground-glass opacity with confluent peribronchial opacity in the superior segment of the right lower lobe (series 4, image 53). Other bilateral lower lobe mildly confluent peribronchial opacity more resembles atelectasis. No pleural effusion. Musculoskeletal: No acute or suspicious osseous lesion identified in the chest. Review of the MIP images confirms the above findings. CT ABDOMEN and PELVIS FINDINGS Hepatobiliary: Multiple large at least 6-7 millimeter areas of abnormally decreased and heterogeneous liver enhancement in the right lobe are poorly marginated and masslike. The right lobe appears mildly expanded compared to 2017. The left lobe may be spared. The gallbladder is grossly normal. There is a small volume of intermediate density free fluid adjacent to the liver compatible with hemoperitoneum. Pancreas: Negative. Spleen: Diminutive and negative. Adrenals/Urinary Tract: Adrenal glands appear normal. No hydronephrosis. Bilateral renal enhancement is symmetric and within normal limits but on delayed images there is minimal contrast excretion from both kidneys. The proximal ureters are decompressed. Diminutive and  unremarkable urinary bladder. There is a small 2 millimeter upper pole right renal calculus which is new from 2017. Stomach/Bowel: Largely decompressed and negative large bowel from the splenic flexure distally. There may be mild sigmoid diverticulosis. Redundant proximal transverse colon with mild retained stool. Similar mild retained stool in the right colon. No large bowel inflammation is evident. Terminal ileum appears negative. Normal appendix suspected on coronal image 72. No dilated small bowel. Mildly fluid distended but otherwise negative stomach. No free air. Free fluid with intermediate density mostly around the liver and in both gutters, small volume overall. Vascular/Lymphatic: Arterial phase images of the abdomen and pelvis were included on the chest study. Abdominal aorta, its branches, iliac and proximal femoral arteries appear patent and within normal limits. On these images the portal venous system is not well evaluated. The main portal vein and SMV appear diminutive. Portal vein was also not well visualized on ultrasound earlier today. No lymphadenopathy. Reproductive: Stable and negative allowing for adjacent hemoperitoneum. Other: Intermediate density pelvic free fluid such as due to hemoperitoneum (series 9, image 82). Volume is moderate and both lower quadrants are also affected. Musculoskeletal: Lower lumbar facet degeneration. No destructive or suspicious osseous lesion identified. Review of the MIP images confirms the above findings. IMPRESSION: 1. Suboptimal CTA chest for PE with no central or  hilar pulmonary embolus. If there is strong clinical suspicion a pulmonary V/Q scan would be complementary to evaluate the segmental and distal branches. 2. Highly abnormal liver with multifocal infiltrative mass-like areas of abnormal enhancement in the right lobe and associated small volume of Hemoperitoneum in the abdomen and pelvis. Top differential considerations include hemorrhagic  Hepatocellular Carcinoma or Hepatic Adenomas. These were not evident on a 2017 CT Abdomen and Pelvis. 3. Pulmonary atelectasis and additional nonspecific ground-glass opacity. Acute pulmonary infection or aspiration difficult to exclude. No pleural effusion. 4. Little renal contrast excretion on the delayed images, but no obstructive uropathy. Consider ATN or other intrinsic acute renal failure. 5. Cardiomegaly is stable or mildly improved since 2019. Study discussed by telephone with Dr. Dorie Rank on 08/12/2019 at 20:50 . Electronically Signed   By: Genevie Ann M.D.   On: 08/12/2019 20:56   CT ABDOMEN PELVIS W CONTRAST  Result Date: 08/12/2019 CLINICAL DATA:  49 year old female with shortness of breath. Nausea vomiting. Epigastric pain and abnormal abdomen ultrasound earlier today suspicious for hepatic metastatic disease. History of lupus. EXAM: CT ANGIOGRAPHY CHEST CT ABDOMEN AND PELVIS WITH CONTRAST TECHNIQUE: Multidetector CT imaging of the chest was performed using the standard protocol during bolus administration of intravenous contrast. Multiplanar CT image reconstructions and MIPs were obtained to evaluate the vascular anatomy. Multidetector CT imaging of the abdomen and pelvis was performed using the standard protocol during bolus administration of intravenous contrast. CONTRAST:  160 milliliters OMNIPAQUE IOHEXOL 350 MG/ML SOLN, 52mL OMNIPAQUE IOHEXOL 350 MG/ML SOLN COMPARISON:  CTA chest 03/26/2018. CT Abdomen and Pelvis 01/05/2016. FINDINGS: CTA CHEST FINDINGS Cardiovascular: Late but adequate contrast bolus timing in the pulmonary arterial tree. Superimposed respiratory motion and low lung volumes. There is no central or hilar pulmonary artery filling defect. But the bilateral pulmonary artery branches are not well evaluated despite repeated contrast bolus attempt. Negative thoracic aorta. Mild cardiomegaly is stable to improved since 2019. No pericardial effusion. No calcified coronary artery  atherosclerosis is evident. Mediastinum/Nodes: Negative.  No mediastinal lymphadenopathy. Lungs/Pleura: Low lung volumes with patent major airways aside from atelectatic changes. Nonspecific scattered and asymmetric bilateral pulmonary ground-glass opacity with confluent peribronchial opacity in the superior segment of the right lower lobe (series 4, image 53). Other bilateral lower lobe mildly confluent peribronchial opacity more resembles atelectasis. No pleural effusion. Musculoskeletal: No acute or suspicious osseous lesion identified in the chest. Review of the MIP images confirms the above findings. CT ABDOMEN and PELVIS FINDINGS Hepatobiliary: Multiple large at least 6-7 millimeter areas of abnormally decreased and heterogeneous liver enhancement in the right lobe are poorly marginated and masslike. The right lobe appears mildly expanded compared to 2017. The left lobe may be spared. The gallbladder is grossly normal. There is a small volume of intermediate density free fluid adjacent to the liver compatible with hemoperitoneum. Pancreas: Negative. Spleen: Diminutive and negative. Adrenals/Urinary Tract: Adrenal glands appear normal. No hydronephrosis. Bilateral renal enhancement is symmetric and within normal limits but on delayed images there is minimal contrast excretion from both kidneys. The proximal ureters are decompressed. Diminutive and unremarkable urinary bladder. There is a small 2 millimeter upper pole right renal calculus which is new from 2017. Stomach/Bowel: Largely decompressed and negative large bowel from the splenic flexure distally. There may be mild sigmoid diverticulosis. Redundant proximal transverse colon with mild retained stool. Similar mild retained stool in the right colon. No large bowel inflammation is evident. Terminal ileum appears negative. Normal appendix suspected on coronal image 72. No dilated  small bowel. Mildly fluid distended but otherwise negative stomach. No free  air. Free fluid with intermediate density mostly around the liver and in both gutters, small volume overall. Vascular/Lymphatic: Arterial phase images of the abdomen and pelvis were included on the chest study. Abdominal aorta, its branches, iliac and proximal femoral arteries appear patent and within normal limits. On these images the portal venous system is not well evaluated. The main portal vein and SMV appear diminutive. Portal vein was also not well visualized on ultrasound earlier today. No lymphadenopathy. Reproductive: Stable and negative allowing for adjacent hemoperitoneum. Other: Intermediate density pelvic free fluid such as due to hemoperitoneum (series 9, image 82). Volume is moderate and both lower quadrants are also affected. Musculoskeletal: Lower lumbar facet degeneration. No destructive or suspicious osseous lesion identified. Review of the MIP images confirms the above findings. IMPRESSION: 1. Suboptimal CTA chest for PE with no central or hilar pulmonary embolus. If there is strong clinical suspicion a pulmonary V/Q scan would be complementary to evaluate the segmental and distal branches. 2. Highly abnormal liver with multifocal infiltrative mass-like areas of abnormal enhancement in the right lobe and associated small volume of Hemoperitoneum in the abdomen and pelvis. Top differential considerations include hemorrhagic Hepatocellular Carcinoma or Hepatic Adenomas. These were not evident on a 2017 CT Abdomen and Pelvis. 3. Pulmonary atelectasis and additional nonspecific ground-glass opacity. Acute pulmonary infection or aspiration difficult to exclude. No pleural effusion. 4. Little renal contrast excretion on the delayed images, but no obstructive uropathy. Consider ATN or other intrinsic acute renal failure. 5. Cardiomegaly is stable or mildly improved since 2019. Study discussed by telephone with Dr. Dorie Rank on 08/12/2019 at 20:50 . Electronically Signed   By: Genevie Ann M.D.   On:  08/12/2019 20:56   DG CHEST PORT 1 VIEW  Result Date: 08/14/2019 CLINICAL DATA:  Shortness of breath EXAM: PORTABLE CHEST 1 VIEW COMPARISON:  Yesterday FINDINGS: Cardiomegaly and vascular pedicle widening. Congested appearance of vessels which may have progressed. Right IJ line with tip at the SVC. No visible effusion or pneumothorax. IMPRESSION: Cardiomegaly with vascular congestion that may have progressed from yesterday. Electronically Signed   By: Monte Fantasia M.D.   On: 08/14/2019 05:17   DG Chest Portable 1 View  Result Date: 08/13/2019 CLINICAL DATA:  Post central line placement. EXAM: PORTABLE CHEST 1 VIEW COMPARISON:  Chest radiograph 08/12/2019 FINDINGS: Interval placement of a right IJ approach central venous catheter with tip projecting in the region of the superior vena cava. Overlying cardiac monitoring leads. Unchanged mild cardiomegaly. Shallow inspiration radiograph. Persistent bibasilar opacities which may reflect atelectasis or pneumonia. No definite pleural effusion or evidence of pneumothorax. IMPRESSION: Interval placement of a right IJ approach central venous catheter with tip projecting in the region of the superior vena cava. Persistent low lung volumes and bibasilar opacities which may reflect atelectasis and/or pneumonia. Electronically Signed   By: Kellie Simmering DO   On: 08/13/2019 09:17   US Abdomen Limited RUQ  Result Date: 08/12/2019 CLINICAL DATA:  Epigastric pain. EXAM: ULTRASOUND ABDOMEN LIMITED RIGHT UPPER QUADRANT COMPARISON:  Abdominopelvic CT 01/05/2016. Abdominal ultrasound 02/28/2015. FINDINGS: Gallbladder: No gallstones or wall thickening visualized. No sonographic Murphy sign noted by sonographer. Common bile duct: Diameter: Not visualized.  No biliary dilatation identified. Liver: The liver is very heterogeneous in echotexture. There are probable ill-defined masses within the left lobe. A predominately hypoechoic mass measures approximately 10.2 x 9.1 x 8.5  cm. There is a more echogenic infiltrative mass  in the left lobe measuring 6.9 x 5.6 x 8.7 cm. These lesions were not present previously. The portal vein is not well visualized. Other: Small amount of ascites. IMPRESSION: 1. New diffuse heterogeneity of the hepatic parenchyma with ill-defined masses in the left hepatic lobe, suspicious for malignancy. Further evaluation with abdominopelvic CT with contrast recommended. 2. The portal vein and common bile duct are not well visualized. No biliary dilatation identified. 3. The gallbladder appears normal. Electronically Signed   By: Richardean Sale M.D.   On: 08/12/2019 16:17   Scheduled Meds: . amLODipine  5 mg Oral Daily  . azaTHIOprine  100 mg Oral BID  . buPROPion  300 mg Oral Daily  . carvedilol  12.5 mg Oral BID WC  . Chlorhexidine Gluconate Cloth  6 each Topical Daily  . cycloSPORINE  1 drop Both Eyes BID  . DULoxetine  120 mg Oral QHS  . famotidine  20 mg Oral BID  . hydroxychloroquine  200 mg Oral BID  . isosorbide-hydrALAZINE  2 tablet Oral TID  . loratadine  10 mg Oral Daily  . megestrol  40 mg Oral Daily  . mometasone-formoterol  2 puff Inhalation BID  . oxybutynin  15 mg Oral Daily  . predniSONE  3 mg Oral Daily  . pregabalin  75 mg Oral TID  . psyllium  1 packet Oral BID  . sodium chloride flush  10-40 mL Intracatheter Q12H  . sucralfate  1 g Oral BID  . topiramate  75 mg Oral QHS   Continuous Infusions: . sodium chloride Stopped (08/13/19 1401)  . sodium chloride Stopped (08/14/19 [id number])  . dextrose 5 % and 0.45% NaCl 75 mL/hr at 08/13/19 0935  . insulin 3.2 Units/hr (08/14/19 0729)  .  sodium bicarbonate (isotonic) infusion in sterile water     PRN Meds:.albuterol, ALPRAZolam, benzonatate, dextrose, doxepin, fluticasone, HYDROmorphone (DILAUDID) injection, ondansetron **OR** ondansetron (ZOFRAN) IV, senna-docusate, sodium chloride flush  ASSESMENT:   *  Liver masses, suspect multifocal hepatic adenomas with small volume  hemoperitoneum.  Hx of hepatic adenomas treated w RFA in 2014.   Suspect element of shock liver.   LFTs improved.  INR normal.    *   DKA.  Improved.    *   AKI.  Worsening. Oliguria vs anuria.     *   Anemia.  Normocytic. Relatively stable Hgb after initial post hydration decline.    *    Thrombocytopenia.     *   Sjogrens, lupus.  On Plaquenil, Imuran, prednisone. Chronic Plavix for hx CVA, on hold.   Morbid obesity.      PLAN   *   Working on transfer to [facility] for definitive treatment of hepatic adenomas which may include IR embolization, biopsy, or surgical resection.  Will need to transfer to inpt service with hepatology consulting.   If she improves enough for discharge home then could arrange [facility] hepatology outpt.   Consult  *  Note that both Azathioprine and Hydroxychloroquine are both potential hepatotoxins.   *   Renal consult likely needed, defer to PMD.       *    INR, CBC, CMET in AM.      Azucena Freed  08/14/2019, 8:44 AM Phone [phone]

## 2019-08-14 NOTE — Evaluation (Signed)
Physical Therapy Evaluation Patient Details Name: [name] MRN: [id number] DOB: 03-10-1971 Today's Date: 08/14/2019   History of Present Illness  Pt is a 49 yo female presenting with nausea and vomiting, found to have acute liver and kidney injury, metabolic acidosis, and hepatic adnenoma. PMH includes morbid obesity, DM, lupus, CVA, OSA, chronic pain.  Clinical Impression  Pt in bed upon arrival of PT/OT, agreeable to evaluation at this time. The pt presents with sig limitations in functional mobility, stability, and safety due to above dx as well as possibly self-limiting attitude. The pt was able to demonstrate bed mobility and transfers without LOB, but requires assistance of 2 people to complete bed mobility and transition to sitting EOB. The pt declined further ambulation and transfers at this time, and maintained a flat affect with minimal responsiveness through the therapy session. The pt will continue to benefit from skilled PT to progress mobility and independence as she was highly independent prior to admission. PT will continue to follow acutely.     Follow Up Recommendations Home health PT;Supervision/Assistance - 24 hour    Equipment Recommendations  (continue to assess RW vs WC)    Recommendations for Other Services       Precautions / Restrictions Precautions Precautions: Fall Restrictions Weight Bearing Restrictions: No      Mobility  Bed Mobility Overal bed mobility: Needs Assistance Bed Mobility: Rolling;Supine to Sit;Sit to Supine Rolling: +2 for physical assistance;Min assist   Supine to sit: Min assist;+2 for physical assistance Sit to supine: Min assist;+2 for physical assistance   General bed mobility comments: Pt is able to initiate movements, but requires some assist for LE and trunk movement to complete transition to and from EOB  Transfers Overall transfer level: Needs assistance Equipment used: 1 person hand held assist Transfers: Sit to/from  Stand;Lateral/Scoot Transfers Sit to Stand: Mod assist        Lateral/Scoot Transfers: Mod assist General transfer comment: Pt initially declining stand and ambulaiton today, was able to demo stand from EOB with HHA of 1 to clear hips from bed for lateral scoot to HOB.  Ambulation/Gait             General Gait Details: declined  Financial trader Rankin (Stroke Patients Only)       Balance Overall balance assessment: Needs assistance Sitting-balance support: Bilateral upper extremity supported;Feet unsupported Sitting balance-Leahy Scale: Fair         Standing balance comment: unable                             Pertinent Vitals/Pain Pain Assessment: Faces Faces Pain Scale: Hurts little more Pain Location: general, may be due to general malaise Pain Descriptors / Indicators: Grimacing;Sore Pain Intervention(s): Limited activity within patient's tolerance;Monitored during session    [place] expects to be discharged to:: Private residence Living Arrangements: Non-relatives/Friends Available Help at Discharge: Family Type of Home: House Home Access: Stairs to enter Entrance Stairs-Rails: Left Entrance Stairs-Number of Steps: 4+4 Home Layout: One level Home Equipment: [place] held shower head;Bedside commode;Cane - single point      Prior Function Level of Independence: Independent         Comments: driving at baseline, does not work. Not currently using AD     Hand Dominance   Dominant Hand: Right    Extremity/Trunk Assessment  Upper Extremity Assessment Upper Extremity Assessment: Defer to OT evaluation;Generalized weakness    Lower Extremity Assessment Lower Extremity Assessment: Generalized weakness    Cervical / Trunk Assessment Cervical / Trunk Assessment: Normal  Communication   Communication: No difficulties  Cognition Arousal/Alertness:  Awake/alert Behavior During Therapy: Flat affect;WFL for tasks assessed/performed Overall Cognitive Status: Difficult to assess                                 General Comments: pt minimally responsive, seems oriented with information given, but difficult to perform thorough cog assessment      General Comments      Exercises     Assessment/Plan    PT Assessment Patient needs continued PT services  PT Problem List Decreased strength;Decreased mobility;Decreased range of motion;Decreased coordination;Decreased activity tolerance;Decreased safety awareness;Decreased cognition;Decreased balance       PT Treatment Interventions DME instruction;Therapeutic exercise;Gait training;Balance training;Stair training;Functional mobility training;Therapeutic activities;Patient/family education    PT Goals (Current goals can be found in the Care Plan section)  Acute Rehab PT Goals Patient Stated Goal: return home PT Goal Formulation: With patient Time For Goal Achievement: 08/28/19 Potential to Achieve Goals: Fair    Frequency Min 3X/week   Barriers to discharge        Co-evaluation               AM-PAC PT "6 Clicks" Mobility  Outcome Measure Help needed turning from your back to your side while in a flat bed without using bedrails?: A Little Help needed moving from lying on your back to sitting on the side of a flat bed without using bedrails?: A Lot Help needed moving to and from a bed to a chair (including a wheelchair)?: A Lot Help needed standing up from a chair using your arms (e.g., wheelchair or bedside chair)?: A Little Help needed to walk in hospital room?: A Lot Help needed climbing 3-5 steps with a railing? : A Lot 6 Click Score: 14    End of Session Equipment Utilized During Treatment: Oxygen Activity Tolerance: Patient limited by fatigue;Patient limited by lethargy Patient left: in bed;with call bell/phone within reach;with bed alarm set Nurse  Communication: Mobility status PT Visit Diagnosis: Difficulty in walking, not elsewhere classified (R26.2);Muscle weakness (generalized) (M62.81)    Time: [id number] PT Time Calculation (min) (ACUTE ONLY): 30 min   Charges:   PT Evaluation $PT Eval Low Complexity: 1 Low          Karma Ganja, PT, DPT   Acute Rehabilitation Department Pager #: [phone]   Otho Bellows 08/14/2019, 3:40 PM

## 2019-08-14 NOTE — Progress Notes (Signed)
PROGRESS NOTE    [name]  [id number] DOB: 1971/07/08 DOA: 08/12/2019 PCP: Bartholome Bill, MD  [facility];  [facility] - Endocrinology  Brief Narrative:  [name] a 49 y.o.femalewithpmhx of Morbid obesity, Lupus, Sjogren's syndrome, CHF, DM c/b gastroparesis, CVA, HLD, OSA who presents with 2 days of severe nausea/vomiting and sudden onset abdominal pain.  Patient reports onset of symptoms was on 1/10, around 4:30 pm soon after eating beans. She states within the hour she began to have severe nausea/vomiting, and has continued to suffer from this thru this time. She denies any fevers, chills, diarrhea. She reports she has had issues with nausea/vomiting with gastroparesis in the past but denies anything of this nature.She denies any dysuria, hematuria. No constipation/diarrhea. She has not been able to keep any meds down. She denies any alcohol use.  Was found to be in DKA, also with AKI and dropping Hgb. CT showed bleeding into peritoneum by bleeding presumed adenoma. Has h/o renal mass with negative biopsy in 2014 and ablation and later resolution. Given gentle IV resuscitation given CHF hx but then dropped her pressure to 70/30s and needed IV fluid bolus. CCM asked to eval pt. For possible ICU admit, but came down and placed R IJ central line and BP was up following fluid bolus after that. Acidemia, slowly corrected on insulin gtt.  GI consulted and thought patient may need transfer to hepatic center and will get in touch with [facility].  Oliguric overnight with rising serum Cr--foley placed this am with lots of urine noted. Nephrology consulted.  Assessment & Plan:   Active Problems:   SLE   SJOGREN'S SYNDROME   HTN (hypertension)   Systolic CHF, acute on chronic (HCC)   Chronic respiratory failure with hypoxia (HCC)   Morbid obesity (HCC)   Liver mass   Diabetic gastroparesis (HCC)   Diabetes ([place])   Hepatic adenoma   Hemoperitoneum  Diabetic acidosis without coma (HCC)   Acute blood loss anemia  Nausea/Vomiting suspect 2/2 DKA T2DM/IDDM - unclear of precipitant, previously was medication compliant, no infectious etiology She has hx of gastroparesis and noted to have liver insult which may have precipitated this.  - continue insulin gtt, DKA protocol--increased fluids again, though her gap has closed and her HCO3 is now 21.  AKI - suspect pre-renalvs ATNin setting of absent PO intake for 2 days, her renal function did worsen, but will continue to hydrate as per DKA protocol, likely severely intravascularly depleted - she does have hx of CHF, with improving EF, nonetheless at present she is dry, will hydrate, hold her diuretics - held Entresto due to ACEi - she received contrast so monitor for CIN over 48-72 hours - monitor I/O--likely worsening due to contrast - avoid nephrotoxic agents - given worsening, requested pharmD consult to further adjust doses of meds  -Nephrology consultation ordered am of 08/14/19 with Cr up to 4.49  Liver masses - seen on CT A/P with possible small amount of hemoperitoneum -hepatocellular injury with massively rising LFTs -may be related to shock due to low BP or necrosis in adenoma per GI - hx of liver masses in 2014,FNA from 03/25/2013 - "no cytomorphologic evidence of malignancy" and differential was hepatocellular adenoma vs focal nodular hyperplasia. Per the patient she had repeat imaging in 6 months and was told it was benign (under Epic PMHx tab there is commentary about ablation, but she denies any hx of hep c or HCC) - interval imaging did not note these  findings(I.e. CT from 2017) - do not suspect it is acutely leading to current presentationof N/V - will obtainchronicHepatitisprofile, denies EtOH use, unexpected weight loss - d/w GI and IR, consider biopsy but caution if bleedingis concern (have held plavix and chemoprophylaxis for this reason)  Per GI, entire  right lobe of liver is involved in multi-lobular adenoma and may need removal/transplant, given complicated picture with chronic immunosuppression, complex medical hx, [facility]. LFT's improving now  CHF - most recent Echo from 05/2018 - EF of 45-50% - she has a mild rise in the HS troponin, no chest pain/pressure (only present after vomiting) - held diuretics - continue carvedilol, isosorbide-hydralazine, held entresto  Hx of CVA - held plavix due to finding of possible hemoperitoneum - continue pravastatin and dropping Hgb 11.9-->8.7 >> 7.9  over the last 18 hours, with some hemodilution certainly  HTN - continue amlodipine, bidil and coreg--held if BP is low  HLD - continue pravastatin  Lupus SS - continue plaquenil, steroids, imuran  GERD - continue carafate  Depression - continue bupropion  Morbid Obesity - BMI [id number] - complicates all aspects of care  OSA -respiratory consulted for CPAP---no one knows her settings and she is unable to give answers. Sister did not know either.  Polypharmacy - at risk of drug-drug interactions, pharmD assisting with renal dosing  DVT prophylaxis: SCDs Code Status: Full Family Communication: Patient, Sister [name] - very interested in transfer to [place] Disposition Plan: unclear pending w/u Indwelling lines: R IJ placed 08/13/19, Foley catheter placed 08/14/19  Consultants:   CCM  GI  Nephrology  Procedures:   R IJ central line  Antimicrobials: None  Subjective: Has become more somnolent overnight. Not getting her CPAP as she would have at home. Also with worsening renal function and no UOP--foley placed with a lot of urine noted in bag.  Objective: Vitals:   08/14/19 0141 08/14/19 0419 08/14/19 0800 08/14/19 0915  BP: (!) 141/63 121/83 (!) 145/94 135/68  Pulse: (!) 101 (!) 102 100 (!) 104  Resp: 20 14 20    Temp: 98.5 F (36.9 C) 98.3 F (36.8 C) 99.1 F (37.3 C)   TempSrc:  Oral Oral Oral   SpO2: 98% 100% 99% 100%  Weight:  (!) 190.9 kg    Height:        Intake/Output Summary (Last 24 hours) at 08/14/2019 1111 Last data filed at 08/14/2019 1105 Gross per 24 hour  Intake 3606.88 ml  Output 500 ml  Net 3106.88 ml   Filed Weights   08/13/19 0038 08/14/19 0419  Weight: (!) 178.7 kg (!) 190.9 kg    Examination:  General exam: Appears somnolent, morbidly obese Respiratory system: Clear to auscultation. breathing comfortably Cardiovascular system: S1 & S2 heard, tachy Gastrointestinal system: Abdomen is nondistended, soft and minimally tender. Central nervous system: somnolent Extremities: Symmetric  Skin: No rashes, lesions or ulcers   Data Reviewed: I have personally reviewed following labs and imaging studies  CBC: Recent Labs  Lab 08/12/19 1332 08/13/19 0606 08/13/19 1420 08/13/19 2020 08/14/19 0157 08/14/19 0412 08/14/19 0530  WBC 12.5* 16.0*  --   --   --  14.4* 14.5*  NEUTROABS  --   --   --   --   --   --  11.4*  HGB 10.9* 9.9* 8.7* 7.8* 7.8* 7.9* 7.9*  HCT 35.6* 32.4* 28.2* 25.3* 25.2* 25.6* 25.8*  MCV 92.7 93.1  --   --   --  93.4 93.1  PLT 271 203  --   --   --  138* [id number]*   Basic Metabolic Panel: Recent Labs  Lab 08/12/19 1506 08/13/19 0200 08/13/19 0606 08/13/19 0900 08/13/19 1640 08/14/19 0820  NA  --  139 142 141 143 140  K  --  4.7 4.5 4.3 4.4 4.4  CL  --  108 109 110 113* 111  CO2  --  18* 18* 19* 19* 21*  GLUCOSE  --  412* 215* 209* 188* 184*  BUN  --  19 22* 25* 29* 37*  CREATININE  --  2.17* 2.82* 3.10* 3.68* 4.49*  CALCIUM  --  8.6* 9.0 8.7* 8.4* 8.2*  MG 1.8  --   --   --   --   --    GFR: Estimated Creatinine Clearance: 28.4 mL/min (A) (by C-G formula based on SCr of 4.49 mg/dL (H)). Liver Function Tests: Recent Labs  Lab 08/12/19 1332 08/13/19 0606 08/14/19 0412  AST 260* 1,139* 537*  ALT 160* 739* 487*  ALKPHOS 76 84 93  BILITOT 0.4 0.8 0.8  PROT 6.6 7.0 6.2*  ALBUMIN 3.1* 3.4* 3.0*    Recent Labs  Lab 08/12/19 1332  LIPASE 18   No results for input(s): AMMONIA in the last 168 hours. Coagulation Profile: Recent Labs  Lab 08/13/19 0811  INR 1.2   CBG: Recent Labs  Lab 08/14/19 0727 08/14/19 0758 08/14/19 0906 08/14/19 1023 08/14/19 1110  GLUCAP 163* 156* 173* 174* 180*   Urine analysis:    Component Value Date/Time   COLORURINE RED (A) 08/12/2019 2210   APPEARANCEUR HAZY (A) 08/12/2019 2210   LABSPEC >1.046 (H) 08/12/2019 2210   PHURINE 6.0 08/12/2019 2210   GLUCOSEU >=500 (A) 08/12/2019 2210   HGBUR MODERATE (A) 08/12/2019 2210   BILIRUBINUR NEGATIVE 08/12/2019 2210   KETONESUR 5 (A) 08/12/2019 2210   PROTEINUR 100 (A) 08/12/2019 2210   UROBILINOGEN 0.2 02/27/2015 1733   NITRITE NEGATIVE 08/12/2019 2210   LEUKOCYTESUR NEGATIVE 08/12/2019 2210   Sepsis Labs:  Recent Results (from the past 240 hour(s))  Respiratory Panel by RT PCR (Flu A&B, Covid) - Nasopharyngeal Swab     Status: None   Collection Time: 08/12/19  3:06 PM   Specimen: Nasopharyngeal Swab  Result Value Ref Range Status   SARS Coronavirus 2 by RT PCR NEGATIVE NEGATIVE Final    Comment: (NOTE) SARS-CoV-2 target nucleic acids are NOT DETECTED. The SARS-CoV-2 RNA is generally detectable in upper respiratoy specimens during the acute phase of infection. The lowest concentration of SARS-CoV-2 viral copies this assay can detect is 131 copies/mL. A negative result does not preclude SARS-Cov-2 infection and should not be used as the sole basis for treatment or other patient management decisions. A negative result may occur with  improper specimen collection/handling, submission of specimen other than nasopharyngeal swab, presence of viral mutation(s) within the areas targeted by this assay, and inadequate number of viral copies (<131 copies/mL). A negative result must be combined with clinical observations, patient history, and epidemiological information. The expected result is  Negative. Fact Sheet for Patients:  PinkCheek.be Fact Sheet for Healthcare Providers:  GravelBags.it This test is not yet ap proved or cleared by the [place] FDA and  has been authorized for detection and/or diagnosis of SARS-CoV-2 by FDA under an Emergency Use Authorization (EUA). This EUA will remain  in effect (meaning this test can be used) for the duration of the COVID-19 declaration under Section 564(b)(1) of the Act, 21 U.S.C. section  360bbb-3(b)(1), unless the authorization is terminated or revoked sooner.    Influenza A by PCR NEGATIVE NEGATIVE Final   Influenza B by PCR NEGATIVE NEGATIVE Final    Comment: (NOTE) The Xpert Xpress SARS-CoV-2/FLU/RSV assay is intended as an aid in  the diagnosis of influenza from Nasopharyngeal swab specimens and  should not be used as a sole basis for treatment. Nasal washings and  aspirates are unacceptable for Xpert Xpress SARS-CoV-2/FLU/RSV  testing. Fact Sheet for Patients: PinkCheek.be Fact Sheet for Healthcare Providers: GravelBags.it This test is not yet approved or cleared by the [place] FDA and  has been authorized for detection and/or diagnosis of SARS-CoV-2 by  FDA under an Emergency Use Authorization (EUA). This EUA will remain  in effect (meaning this test can be used) for the duration of the  Covid-19 declaration under Section 564(b)(1) of the Act, 21  U.S.C. section 360bbb-3(b)(1), unless the authorization is  terminated or revoked. Performed at [facility], [place] [address], [place], [place] [address]   Urine culture     Status: None (Preliminary result)   Collection Time: 08/12/19 10:00 PM   Specimen: Urine, Clean Catch  Result Value Ref Range Status   Specimen Description URINE, CLEAN CATCH  Final   Special Requests NONE  Final   Culture   Final    CULTURE REINCUBATED FOR BETTER  GROWTH Performed at [facility], [place] [address] [place], [place] [address]    Report Status PENDING  Incomplete      Radiology Studies: DG Chest 2 View  Result Date: 08/12/2019 CLINICAL DATA:  50 year old female with history of shortness of breath. EXAM: CHEST - 2 VIEW COMPARISON:  Chest x-ray 03/26/2018. FINDINGS: Lung volumes are low. Bibasilar opacities which may reflect areas of atelectasis and/or consolidation. No definite pleural effusions. No evidence of pulmonary edema. Mild cardiomegaly. Upper mediastinal contours are within normal limits. IMPRESSION: 1. Low lung volumes with bibasilar opacities which may be areas of atelectasis and/or consolidation. Electronically Signed   By: Vinnie Langton M.D.   On: 08/12/2019 16:17   CT Angio Chest PE W and/or Wo Contrast  Result Date: 08/12/2019 CLINICAL DATA:  49 year old female with shortness of breath. Nausea vomiting. Epigastric pain and abnormal abdomen ultrasound earlier today suspicious for hepatic metastatic disease. History of lupus. EXAM: CT ANGIOGRAPHY CHEST CT ABDOMEN AND PELVIS WITH CONTRAST TECHNIQUE: Multidetector CT imaging of the chest was performed using the standard protocol during bolus administration of intravenous contrast. Multiplanar CT image reconstructions and MIPs were obtained to evaluate the vascular anatomy. Multidetector CT imaging of the abdomen and pelvis was performed using the standard protocol during bolus administration of intravenous contrast. CONTRAST:  160 milliliters OMNIPAQUE IOHEXOL 350 MG/ML SOLN, 68mL OMNIPAQUE IOHEXOL 350 MG/ML SOLN COMPARISON:  CTA chest 03/26/2018. CT Abdomen and Pelvis 01/05/2016. FINDINGS: CTA CHEST FINDINGS Cardiovascular: Late but adequate contrast bolus timing in the pulmonary arterial tree. Superimposed respiratory motion and low lung volumes. There is no central or hilar pulmonary artery filling defect. But the bilateral pulmonary artery branches are not well evaluated  despite repeated contrast bolus attempt. Negative thoracic aorta. Mild cardiomegaly is stable to improved since 2019. No pericardial effusion. No calcified coronary artery atherosclerosis is evident. Mediastinum/Nodes: Negative.  No mediastinal lymphadenopathy. Lungs/Pleura: Low lung volumes with patent major airways aside from atelectatic changes. Nonspecific scattered and asymmetric bilateral pulmonary ground-glass opacity with confluent peribronchial opacity in the superior segment of the right lower lobe (series 4, image 53). Other bilateral  lower lobe mildly confluent peribronchial opacity more resembles atelectasis. No pleural effusion. Musculoskeletal: No acute or suspicious osseous lesion identified in the chest. Review of the MIP images confirms the above findings. CT ABDOMEN and PELVIS FINDINGS Hepatobiliary: Multiple large at least 6-7 millimeter areas of abnormally decreased and heterogeneous liver enhancement in the right lobe are poorly marginated and masslike. The right lobe appears mildly expanded compared to 2017. The left lobe may be spared. The gallbladder is grossly normal. There is a small volume of intermediate density free fluid adjacent to the liver compatible with hemoperitoneum. Pancreas: Negative. Spleen: Diminutive and negative. Adrenals/Urinary Tract: Adrenal glands appear normal. No hydronephrosis. Bilateral renal enhancement is symmetric and within normal limits but on delayed images there is minimal contrast excretion from both kidneys. The proximal ureters are decompressed. Diminutive and unremarkable urinary bladder. There is a small 2 millimeter upper pole right renal calculus which is new from 2017. Stomach/Bowel: Largely decompressed and negative large bowel from the splenic flexure distally. There may be mild sigmoid diverticulosis. Redundant proximal transverse colon with mild retained stool. Similar mild retained stool in the right colon. No large bowel inflammation is  evident. Terminal ileum appears negative. Normal appendix suspected on coronal image 72. No dilated small bowel. Mildly fluid distended but otherwise negative stomach. No free air. Free fluid with intermediate density mostly around the liver and in both gutters, small volume overall. Vascular/Lymphatic: Arterial phase images of the abdomen and pelvis were included on the chest study. Abdominal aorta, its branches, iliac and proximal femoral arteries appear patent and within normal limits. On these images the portal venous system is not well evaluated. The main portal vein and SMV appear diminutive. Portal vein was also not well visualized on ultrasound earlier today. No lymphadenopathy. Reproductive: Stable and negative allowing for adjacent hemoperitoneum. Other: Intermediate density pelvic free fluid such as due to hemoperitoneum (series 9, image 82). Volume is moderate and both lower quadrants are also affected. Musculoskeletal: Lower lumbar facet degeneration. No destructive or suspicious osseous lesion identified. Review of the MIP images confirms the above findings. IMPRESSION: 1. Suboptimal CTA chest for PE with no central or hilar pulmonary embolus. If there is strong clinical suspicion a pulmonary V/Q scan would be complementary to evaluate the segmental and distal branches. 2. Highly abnormal liver with multifocal infiltrative mass-like areas of abnormal enhancement in the right lobe and associated small volume of Hemoperitoneum in the abdomen and pelvis. Top differential considerations include hemorrhagic Hepatocellular Carcinoma or Hepatic Adenomas. These were not evident on a 2017 CT Abdomen and Pelvis. 3. Pulmonary atelectasis and additional nonspecific ground-glass opacity. Acute pulmonary infection or aspiration difficult to exclude. No pleural effusion. 4. Little renal contrast excretion on the delayed images, but no obstructive uropathy. Consider ATN or other intrinsic acute renal failure. 5.  Cardiomegaly is stable or mildly improved since 2019. Study discussed by telephone with Dr. Dorie Rank on 08/12/2019 at 20:50 . Electronically Signed   By: Genevie Ann M.D.   On: 08/12/2019 20:56   CT ABDOMEN PELVIS W CONTRAST  Result Date: 08/12/2019 CLINICAL DATA:  49 year old female with shortness of breath. Nausea vomiting. Epigastric pain and abnormal abdomen ultrasound earlier today suspicious for hepatic metastatic disease. History of lupus. EXAM: CT ANGIOGRAPHY CHEST CT ABDOMEN AND PELVIS WITH CONTRAST TECHNIQUE: Multidetector CT imaging of the chest was performed using the standard protocol during bolus administration of intravenous contrast. Multiplanar CT image reconstructions and MIPs were obtained to evaluate the vascular anatomy. Multidetector CT imaging of  the abdomen and pelvis was performed using the standard protocol during bolus administration of intravenous contrast. CONTRAST:  160 milliliters OMNIPAQUE IOHEXOL 350 MG/ML SOLN, 65mL OMNIPAQUE IOHEXOL 350 MG/ML SOLN COMPARISON:  CTA chest 03/26/2018. CT Abdomen and Pelvis 01/05/2016. FINDINGS: CTA CHEST FINDINGS Cardiovascular: Late but adequate contrast bolus timing in the pulmonary arterial tree. Superimposed respiratory motion and low lung volumes. There is no central or hilar pulmonary artery filling defect. But the bilateral pulmonary artery branches are not well evaluated despite repeated contrast bolus attempt. Negative thoracic aorta. Mild cardiomegaly is stable to improved since 2019. No pericardial effusion. No calcified coronary artery atherosclerosis is evident. Mediastinum/Nodes: Negative.  No mediastinal lymphadenopathy. Lungs/Pleura: Low lung volumes with patent major airways aside from atelectatic changes. Nonspecific scattered and asymmetric bilateral pulmonary ground-glass opacity with confluent peribronchial opacity in the superior segment of the right lower lobe (series 4, image 53). Other bilateral lower lobe mildly confluent  peribronchial opacity more resembles atelectasis. No pleural effusion. Musculoskeletal: No acute or suspicious osseous lesion identified in the chest. Review of the MIP images confirms the above findings. CT ABDOMEN and PELVIS FINDINGS Hepatobiliary: Multiple large at least 6-7 millimeter areas of abnormally decreased and heterogeneous liver enhancement in the right lobe are poorly marginated and masslike. The right lobe appears mildly expanded compared to 2017. The left lobe may be spared. The gallbladder is grossly normal. There is a small volume of intermediate density free fluid adjacent to the liver compatible with hemoperitoneum. Pancreas: Negative. Spleen: Diminutive and negative. Adrenals/Urinary Tract: Adrenal glands appear normal. No hydronephrosis. Bilateral renal enhancement is symmetric and within normal limits but on delayed images there is minimal contrast excretion from both kidneys. The proximal ureters are decompressed. Diminutive and unremarkable urinary bladder. There is a small 2 millimeter upper pole right renal calculus which is new from 2017. Stomach/Bowel: Largely decompressed and negative large bowel from the splenic flexure distally. There may be mild sigmoid diverticulosis. Redundant proximal transverse colon with mild retained stool. Similar mild retained stool in the right colon. No large bowel inflammation is evident. Terminal ileum appears negative. Normal appendix suspected on coronal image 72. No dilated small bowel. Mildly fluid distended but otherwise negative stomach. No free air. Free fluid with intermediate density mostly around the liver and in both gutters, small volume overall. Vascular/Lymphatic: Arterial phase images of the abdomen and pelvis were included on the chest study. Abdominal aorta, its branches, iliac and proximal femoral arteries appear patent and within normal limits. On these images the portal venous system is not well evaluated. The main portal vein and SMV  appear diminutive. Portal vein was also not well visualized on ultrasound earlier today. No lymphadenopathy. Reproductive: Stable and negative allowing for adjacent hemoperitoneum. Other: Intermediate density pelvic free fluid such as due to hemoperitoneum (series 9, image 82). Volume is moderate and both lower quadrants are also affected. Musculoskeletal: Lower lumbar facet degeneration. No destructive or suspicious osseous lesion identified. Review of the MIP images confirms the above findings. IMPRESSION: 1. Suboptimal CTA chest for PE with no central or hilar pulmonary embolus. If there is strong clinical suspicion a pulmonary V/Q scan would be complementary to evaluate the segmental and distal branches. 2. Highly abnormal liver with multifocal infiltrative mass-like areas of abnormal enhancement in the right lobe and associated small volume of Hemoperitoneum in the abdomen and pelvis. Top differential considerations include hemorrhagic Hepatocellular Carcinoma or Hepatic Adenomas. These were not evident on a 2017 CT Abdomen and Pelvis. 3. Pulmonary atelectasis and additional  nonspecific ground-glass opacity. Acute pulmonary infection or aspiration difficult to exclude. No pleural effusion. 4. Little renal contrast excretion on the delayed images, but no obstructive uropathy. Consider ATN or other intrinsic acute renal failure. 5. Cardiomegaly is stable or mildly improved since 2019. Study discussed by telephone with Dr. Dorie Rank on 08/12/2019 at 20:50 . Electronically Signed   By: Genevie Ann M.D.   On: 08/12/2019 20:56   DG CHEST PORT 1 VIEW  Result Date: 08/14/2019 CLINICAL DATA:  Shortness of breath EXAM: PORTABLE CHEST 1 VIEW COMPARISON:  Yesterday FINDINGS: Cardiomegaly and vascular pedicle widening. Congested appearance of vessels which may have progressed. Right IJ line with tip at the SVC. No visible effusion or pneumothorax. IMPRESSION: Cardiomegaly with vascular congestion that may have progressed  from yesterday. Electronically Signed   By: Monte Fantasia M.D.   On: 08/14/2019 05:17   DG Chest Portable 1 View  Result Date: 08/13/2019 CLINICAL DATA:  Post central line placement. EXAM: PORTABLE CHEST 1 VIEW COMPARISON:  Chest radiograph 08/12/2019 FINDINGS: Interval placement of a right IJ approach central venous catheter with tip projecting in the region of the superior vena cava. Overlying cardiac monitoring leads. Unchanged mild cardiomegaly. Shallow inspiration radiograph. Persistent bibasilar opacities which may reflect atelectasis or pneumonia. No definite pleural effusion or evidence of pneumothorax. IMPRESSION: Interval placement of a right IJ approach central venous catheter with tip projecting in the region of the superior vena cava. Persistent low lung volumes and bibasilar opacities which may reflect atelectasis and/or pneumonia. Electronically Signed   By: Kellie Simmering DO   On: 08/13/2019 09:17   US Abdomen Limited RUQ  Result Date: 08/12/2019 CLINICAL DATA:  Epigastric pain. EXAM: ULTRASOUND ABDOMEN LIMITED RIGHT UPPER QUADRANT COMPARISON:  Abdominopelvic CT 01/05/2016. Abdominal ultrasound 02/28/2015. FINDINGS: Gallbladder: No gallstones or wall thickening visualized. No sonographic Murphy sign noted by sonographer. Common bile duct: Diameter: Not visualized.  No biliary dilatation identified. Liver: The liver is very heterogeneous in echotexture. There are probable ill-defined masses within the left lobe. A predominately hypoechoic mass measures approximately 10.2 x 9.1 x 8.5 cm. There is a more echogenic infiltrative mass in the left lobe measuring 6.9 x 5.6 x 8.7 cm. These lesions were not present previously. The portal vein is not well visualized. Other: Small amount of ascites. IMPRESSION: 1. New diffuse heterogeneity of the hepatic parenchyma with ill-defined masses in the left hepatic lobe, suspicious for malignancy. Further evaluation with abdominopelvic CT with contrast  recommended. 2. The portal vein and common bile duct are not well visualized. No biliary dilatation identified. 3. The gallbladder appears normal. Electronically Signed   By: Richardean Sale M.D.   On: 08/12/2019 16:17    Scheduled Meds: . amLODipine  5 mg Oral Daily  . azaTHIOprine  100 mg Oral BID  . buPROPion  300 mg Oral Daily  . carvedilol  12.5 mg Oral BID WC  . Chlorhexidine Gluconate Cloth  6 each Topical Daily  . cycloSPORINE  1 drop Both Eyes BID  . DULoxetine  120 mg Oral QHS  . famotidine  20 mg Oral BID  . hydroxychloroquine  200 mg Oral BID  . isosorbide-hydrALAZINE  2 tablet Oral TID  . loratadine  10 mg Oral Daily  . megestrol  40 mg Oral Daily  . mometasone-formoterol  2 puff Inhalation BID  . oxybutynin  15 mg Oral Daily  . predniSONE  3 mg Oral Daily  . pregabalin  75 mg Oral BID  . psyllium  1 packet Oral BID  . sodium chloride flush  10-40 mL Intracatheter Q12H  . sucralfate  1 g Oral BID  . topiramate  75 mg Oral QHS   Continuous Infusions: . sodium chloride Stopped (08/13/19 1401)  . sodium chloride Stopped (08/14/19 [id number])  . dextrose 5 % and 0.45% NaCl 75 mL/hr at 08/13/19 0935  . insulin 3.8 Units/hr (08/14/19 0928)  . sodium bicarbonate 150 mEq in dextrose 5% 1000 mL 150 mEq (08/14/19 1101)     LOS: 2 days     Donnamae Jude, MD [facility] Pager [phone]  If 7PM-7AM, please contact night-coverage [email or web address] Password [email or web address] 08/14/2019, 11:11 AM

## 2019-08-14 NOTE — Consult Note (Addendum)
[facility] Nephrology Consultation Note  Requesting MD: Dr Kennon Rounds, Lavella Lemons Reason for consult: AKI  HPI:  [name] is a 49 y.o. female with morbid obesity, OSA, lupus, Sjogren's syndrome, CHF, diabetes with gastroparesis, stroke, HLD who was admitted on 08/12/2019 for severe nausea/vomiting, seen as a consultation at the request of Dr. Kennon Rounds for acute kidney injury.  Patient presented to the ER with sudden onset of nausea and vomiting associated with abdominal pain.  The initial work-up showed elevated liver enzyme, acute kidney injury with creatinine level of 1.28, acidosis with hyperglycemia, anemia with hemoglobin of 9.  She underwent CT scan of abdomen pelvis and chest with IV contrast.  The imaging studies showed hemorrhagic hepatic mass with hemoperitoneum, no PE.  No hydronephrosis reported. Subsequently, patient developed hypotension with blood pressure 70 /30s which was improved with IV fluid.  Hemoglobin further dropped to 7.8.  GI was consulted and now discussion ongoing to transfer to [facility] for further evaluation.  The serum creatinine level continued to rise to 4.49 today.  She was  started on sodium bicarbonate IV fluid.  Also on insulin drip for the treatment of DKA.  The Foley catheter was inserted with 500 cc of urine output.  Critical care team was consulted and central line was placed.  During evaluation her blood pressure was 152/80.  She was able to lie flat.  She reports weakness however denied any nausea, vomiting, chest pain, shortness of breath.  Reported no abdominal pain.  She is on azathioprine, Plaquenil,  prednisone for her autoimmune disease.  Also on torsemide at home which is currently on hold.   PMHx:   Past Medical History:  Diagnosis Date  . Acute renal failure (HCC)     Intractable nausea vomiting secondary to diabetic gastroparesis causing dehydration and acute renal failure /notes 04/01/2013  . Anginal pain ([place])   . Anxiety   . Bell's  palsy 08/09/2010  . CAP (community acquired pneumonia) 10/2011   Archie Endo 11/08/2011  . Chest pain 01/13/2014  . CHF (congestive heart failure) ([place])   . Diabetic gastroparesis associated with type 2 diabetes mellitus ([place])    this is presumed diagnoses, not confirmed by any studies.   . Eczema   . Family history of malignant neoplasm of breast   . Family history of malignant neoplasm of ovary   . GERD (gastroesophageal reflux disease)   . High cholesterol   . Hypertension   . Liver lesion 08/13/2019  . Liver mass 2014   biopsied 03/2013 at [facility], not malignant.  is to undergo radiologic ablation of the mass in sept/October 2014.   . Lupus ([place])   . Migraines    "maybe a couple times/yr" (04/01/2013)  . Obesity   . Obstructive sleep apnea on CPAP 2011  . On home oxygen therapy    "2L 24/7" (02/28/2015)  . SJOGREN'S SYNDROME 08/09/2010  . SLE (systemic lupus erythematosus) ([place])    Archie Endo 12/01/2010  . Stroke [facility]) 2010; 10/2012   "left side is still weak from it, never fully regained full strength; no additions from stroke 10/2012"    Past Surgical History:  Procedure Laterality Date  . BREAST CYST EXCISION Left 08/2005   epidermoid  . CARDIAC CATHETERIZATION  02/07/12  . ECTOPIC PREGNANCY SURGERY  1999  . ECTOPIC PREGNANCY SURGERY  1999  . ESOPHAGOGASTRODUODENOSCOPY N/A 02/26/2013   Procedure: ESOPHAGOGASTRODUODENOSCOPY (EGD);  Surgeon: Irene Shipper, MD;  Location: [facility] ENDOSCOPY;  Service: Endoscopy;  Laterality: N/A;  .  ESOPHAGOGASTRODUODENOSCOPY N/A 03/02/2015   Procedure: ESOPHAGOGASTRODUODENOSCOPY (EGD);  Surgeon: Milus Banister, MD;  Location: [place];  Service: Endoscopy;  Laterality: N/A;  . HERNIA REPAIR    . LEFT AND RIGHT HEART CATHETERIZATION WITH CORONARY ANGIOGRAM N/A 02/07/2012   Procedure: LEFT AND RIGHT HEART CATHETERIZATION WITH CORONARY ANGIOGRAM;  Surgeon: Laverda Page, MD;  Location: [facility] CATH LAB;  Service: Cardiovascular;  Laterality: N/A;  . LIVER BIOPSY   03/2013   liver mass/medical hx noted above  . MUSCLE BIOPSY     for lupus/notes 12/01/2010  . UMBILICAL HERNIA REPAIR  1980's    Family Hx:  Family History  Problem Relation Age of Onset  . Diabetes Mother   . Asthma Mother   . Urticaria Mother   . Hypertension Mother   . Breast cancer Maternal Aunt 3       currently 56  . Stomach cancer Maternal Uncle        dx 46s; deceased  . Prostate cancer Maternal Uncle        deceased 21s  . Cancer Maternal Aunt        unk. primary  . Cancer Maternal Aunt        unk. primary  . Ovarian cancer Maternal Aunt        deceased 64s  . Hypertension Other   . Stroke Other   . Arthritis Other   . Ovarian cancer Sister 77       maternal half-sister; RAD51C positive  . Cancer Paternal Aunt        unk. primary; deceased 28s  . Prostate cancer Paternal Uncle        deceased 21s  . Breast cancer Cousin        deceased 103s; daughter of mat uncle with stomach ca  . Breast cancer Maternal Aunt        deceased 86s  . Asthma Brother   . Asthma Brother   . Allergic rhinitis Neg Hx   . Angioedema Neg Hx     Social History:  reports that she quit smoking about 28 years ago. Her smoking use included cigarettes. She has a 1.50 pack-year smoking history. She has never used smokeless tobacco. She reports that she does not drink alcohol or use drugs.  Allergies:  Allergies  Allergen Reactions  . Metoclopramide Other (See Comments)    Developed restless leg, akathisia type limb movements.   . Codeine Itching, Rash and Other (See Comments)  . Penicillins Itching    Has patient had a PCN reaction causing immediate rash, facial/tongue/throat swelling, SOB or lightheadedness with hypotension: No Has patient had a PCN reaction causing severe rash involving mucus membranes or skin necrosis: No Has patient had a PCN reaction that required hospitalization: No Has patient had a PCN reaction occurring within the last 10 years:yes If all of the above answers  are "NO", then may proceed with Cephalosporin use.  Marland Kitchen Hydrocodone Rash  . Percocet [Oxycodone-Acetaminophen] Hives and Rash    Tolerates dilaudid    Medications: Prior to Admission medications   Medication Sig Start Date End Date Taking? Authorizing Provider  albuterol (PROAIR HFA) 108 (90 BASE) MCG/ACT inhaler Inhale 2 puffs into the lungs every 6 (six) hours as needed for wheezing or shortness of breath.    Yes [provider]  albuterol (PROVENTIL) (2.5 MG/3ML) 0.083% nebulizer solution Take 2.5 mg by nebulization every 6 (six) hours as needed for wheezing.   Yes [provider]  ALPRAZolam Duanne Moron) 0.5  MG tablet Take 0.5 mg 2 (two) times daily as needed by mouth for anxiety or sleep.  04/19/17  Yes [provider]  amLODipine (NORVASC) 5 MG tablet Take 1 tablet (5 mg total) by mouth daily. 10/19/18  Yes Miquel Dunn, NP  aspirin EC 81 MG tablet Take 81 mg by mouth daily.   Yes [provider]  Azathioprine 75 MG TABS Take 150 mg by mouth 2 (two) times daily.    Yes [provider]  benzonatate (TESSALON) 100 MG capsule Take by mouth continuous as needed for cough.   Yes [provider]  BIDIL 20-37.5 MG tablet TAKE TWO (2) TABLETS BY MOUTH THREE TIMES DAILY  Patient taking differently: Take 2 tablets by mouth 3 (three) times daily.  04/03/19  Yes Miquel Dunn, NP  buPROPion (WELLBUTRIN XL) 300 MG 24 hr tablet Take 300 mg by mouth daily. 06/28/19  Yes [provider]  BYETTA 10 MCG PEN 10 MCG/0.04ML SOPN injection INJECT 0.04ML UNDER THE SKIN 2 TIMES DAILY WITH A MEAL Patient taking differently: Inject 10 mcg into the skin 2 (two) times daily with a meal.  08/07/19  Yes Renato Shin, MD  carvedilol (COREG) 25 MG tablet Take 37.5 mg by mouth 2 (two) times daily with a meal.    Yes [provider]  cetirizine (ZYRTEC) 10 MG tablet Take 10 mg by mouth daily as needed for allergies.    Yes [provider]  clopidogrel (PLAVIX) 75 MG tablet Take 75 mg by mouth daily.   Yes [provider]  clotrimazole-betamethasone (LOTRISONE) cream Apply 1 application topically 2 (two) times daily. Patient taking differently: Apply 1 application topically as needed (skin irritation).  01/01/19  Yes Renato Shin, MD  cycloSPORINE (RESTASIS) 0.05 % ophthalmic emulsion Place 1 drop into both eyes 2 (two) times daily.    Yes [provider]  diclofenac sodium (VOLTAREN) 1 % GEL Apply 2-4 g 4 (four) times daily as needed topically (joint pain). 06/06/17  Yes Vann, Jessica U, DO  doxepin (SINEQUAN) 50 MG capsule Take 50-100 mg by mouth at bedtime as needed (sleep).    Yes [provider]  DULoxetine (CYMBALTA) 60 MG capsule Take 120 mg by mouth at bedtime.    Yes [provider]  ENTRESTO 97-103 MG TAKE 1 TABLET BY MOUTH TWICE A DAY Patient taking differently: Take 1 tablet by mouth 2 (two) times daily.  06/04/19  Yes Adrian Prows, MD  etonogestrel-ethinyl estradiol (NUVARING) 0.12-0.015 MG/24HR vaginal ring Place 1 each every 28 (twenty-eight) days vaginally. Insert vaginally and leave in place for 3 consecutive weeks, then remove for 1 week.   Yes [provider]  famotidine (PEPCID) 20 MG tablet Take 20 mg by mouth 2 (two) times daily.   Yes [provider]  fluticasone (FLONASE) 50 MCG/ACT nasal spray Place 2 sprays into both nostrils continuous as needed for allergies or rhinitis.   Yes [provider]  hydroxychloroquine (PLAQUENIL) 200 MG tablet Take 200 mg by mouth 2 (two) times daily.    Yes [provider]  insulin NPH Human (HUMULIN N) 100 UNIT/ML injection Inject 2 mLs (200 Units total) into the skin every morning. And syringes 3/day Patient taking differently: Inject 220 Units into the skin every morning. And syringes 3/day 07/18/19  Yes Renato Shin, MD  megestrol (MEGACE) 40 MG tablet Take 40 mg by mouth daily. 01/30/15  Yes [provider]  metFORMIN (GLUCOPHAGE-XR) 500 MG 24 hr  tablet Take 1,000 mg by mouth 2 (two) times daily.   Yes [provider]  mometasone-formoterol (DULERA) 100-5 MCG/ACT AERO Inhale 2 puffs into the lungs 2 (two) times daily. 05/29/17  Yes [provider]  oxybutynin (DITROPAN-XL) 10 MG 24 hr tablet Take 15 mg by mouth daily.  08/30/17  Yes [provider]  Polyvinyl Alcohol-Povidone (REFRESH OP) Place 1 drop 2 (two) times daily into both eyes.    Yes [provider]  pravastatin (PRAVACHOL) 10 MG tablet Take 10 mg by mouth daily.   Yes [provider]  predniSONE (DELTASONE) 1 MG tablet Take 3 mg by mouth daily.  04/21/17  Yes [provider]  pregabalin (LYRICA) 75 MG capsule Take 75 mg 3 (three) times daily by mouth.  01/28/15  Yes [provider]  psyllium (METAMUCIL MULTIHEALTH FIBER) 58.6 % powder Take 1 packet by mouth 2 (two) times daily.   Yes [provider]  spironolactone (ALDACTONE) 50 MG tablet TAKE 1 TABLET BY MOUTH EVERY DAY Patient taking differently: Take 50 mg by mouth daily.  04/09/19  Yes Miquel Dunn, NP  sucralfate (CARAFATE) 1 GM/10ML suspension Take 1 g by mouth 2 (two) times daily. 07/08/19  Yes [provider]  topiramate (TOPAMAX) 25 MG tablet Take 75 mg by mouth at bedtime. 03/17/16  Yes [provider]  torsemide (DEMADEX) 20 MG tablet 40 mg in AM and 20 mg in PM Patient taking differently: Take 20 mg by mouth 2 (two) times daily.  09/23/17  Yes Debbe Odea, MD  traMADol (ULTRAM) 50 MG tablet TAKE 1 TO 2 TABLETS BY MOUTH EVERY 6 HOURS AS NEEDED FOR PAIN**PA REQ** Patient taking differently: Take 50-100 mg by mouth every 6 (six) hours as needed (pain).  01/21/19  Yes Pete Pelt, PA-C  Vitamin D, Ergocalciferol, (DRISDOL) 1.25 MG (50000 UNIT) CAPS capsule Take 50,000 Units by mouth every 7 (seven) days.   Yes [provider]  Accu-Chek Softclix Lancets lancets  Use to monitor glucose levels 4 times per day; E11.9 10/19/18   Renato Shin, MD  B-D UF III MINI PEN NEEDLES 31G X 5 MM MISC USE AS DIRECTED TWICE A DAY 10/31/18   Renato Shin, MD  Blood Glucose Monitoring Suppl (ACCU-CHEK AVIVA) device Use as instructed 10/25/18 10/25/19  Philemon Kingdom, MD  cyanocobalamin (,VITAMIN B-12,) 1000 MCG/ML injection Inject into the muscle. Every other month 12/17/18   [provider]  EASY TOUCH PEN NEEDLES 32G X 4 MM MISC USE TO INJECT INSULIN TWICE DAILY 08/07/19   Renato Shin, MD  glucose blood (ACCU-CHEK AVIVA PLUS) test strip Use to check blood sugar 4 times daily. **Needs appt for refills** 07/09/19   Elayne Snare, MD  Insulin Syringe-Needle U-100 (EASY TOUCH INSULIN SYRINGE) 31G X 5/16" 1 ML MISC USE AS DIRECTED THREE TIMES A DAY 10/12/18   Renato Shin, MD    I have reviewed the patient's current medications.  Labs:  Results for orders placed or performed during the hospital encounter of 08/12/19 (from the past 48 hour(s))  Urine culture     Status: Abnormal   Collection Time: 08/12/19 10:00 PM   Specimen: Urine, Clean Catch  Result Value Ref Range   Specimen Description URINE, CLEAN CATCH    Special Requests NONE    Culture (A)     >=100,000 COLONIES/mL GROUP B STREP(S.AGALACTIAE)ISOLATED TESTING AGAINST S. AGALACTIAE NOT ROUTINELY PERFORMED DUE TO PREDICTABILITY OF AMP/PEN/VAN SUSCEPTIBILITY. Performed at [facility], [place]  [address] [place], [place] [address]    Report Status 08/14/2019 FINAL   Urinalysis, Routine w reflex microscopic     Status: Abnormal   Collection Time: 08/12/19 10:10 PM  Result Value Ref Range   Color, Urine RED (A) YELLOW    Comment: BIOCHEMICALS MAY BE AFFECTED BY COLOR   APPearance HAZY (A) CLEAR   Specific Gravity, Urine >1.046 (H) 1.005 - 1.030   pH 6.0 5.0 - 8.0   Glucose, UA >=500 (A) NEGATIVE mg/dL   Hgb urine dipstick MODERATE (A) NEGATIVE   Bilirubin Urine NEGATIVE NEGATIVE   Ketones, ur 5 (A)  NEGATIVE mg/dL   Protein, ur 100 (A) NEGATIVE mg/dL   Nitrite NEGATIVE NEGATIVE   Leukocytes,Ua NEGATIVE NEGATIVE   RBC / HPF >50 (H) 0 - 5 RBC/hpf   WBC, UA 0-5 0 - 5 WBC/hpf   Bacteria, UA NONE SEEN NONE SEEN    Comment: Performed at [facility], [place] [address], [place], [place] [address]  CBG monitoring, ED     Status: Abnormal   Collection Time: 08/12/19 11:36 PM  Result Value Ref Range   Glucose-Capillary 433 (H) 70 - 99 mg/dL  CBG monitoring, ED     Status: Abnormal   Collection Time: 08/13/19 12:36 AM  Result Value Ref Range   Glucose-Capillary 402 (H) 70 - 99 mg/dL  CBG monitoring, ED     Status: Abnormal   Collection Time: 08/13/19  1:44 AM  Result Value Ref Range   Glucose-Capillary 398 (H) 70 - 99 mg/dL  Basic metabolic panel     Status: Abnormal   Collection Time: 08/13/19  2:00 AM  Result Value Ref Range   Sodium 139 135 - 145 mmol/L   Potassium 4.7 3.5 - 5.1 mmol/L   Chloride 108 98 - 111 mmol/L   CO2 18 (L) 22 - 32 mmol/L   Glucose, Bld 412 (H) 70 - 99 mg/dL   BUN 19 6 - 20 mg/dL   Creatinine, Ser 2.17 (H) 0.44 - 1.00 mg/dL   Calcium 8.6 (L) 8.9 - 10.3 mg/dL   GFR calc non Af Amer 26 (L) >60 mL/min   GFR calc Af Amer 30 (L) >60 mL/min   Anion gap 13 5 - 15    Comment: Performed at [facility], [place] [address], [place], [place] [address]  Beta-hydroxybutyric acid     Status: Abnormal   Collection Time: 08/13/19  2:00 AM  Result Value Ref Range   Beta-Hydroxybutyric Acid 3.38 (H) 0.05 - 0.27 mmol/L    Comment: Performed at [facility] [address] [place], [place] [address]  CBG monitoring, ED     Status: Abnormal   Collection Time: 08/13/19  3:11 AM  Result Value Ref Range   Glucose-Capillary 281 (H) 70 - 99 mg/dL  CBG monitoring, ED     Status: Abnormal   Collection Time: 08/13/19  4:33 AM  Result Value Ref Range   Glucose-Capillary 236 (H) 70 - 99 mg/dL  CBG monitoring, ED     Status: Abnormal   Collection Time: 08/13/19  5:40  AM  Result Value Ref Range   Glucose-Capillary 208 (H) 70 - 99 mg/dL  Beta-hydroxybutyric acid     Status: Abnormal   Collection Time: 08/13/19  6:06 AM  Result Value Ref Range   Beta-Hydroxybutyric Acid 0.46 (H) 0.05 - 0.27 mmol/L    Comment: Performed at [facility] [address] [place], [place] [address]  HIV Antibody (routine testing w rflx)     Status: None   Collection Time: 08/13/19  6:06 AM  Result Value Ref Range   HIV Screen 4th Generation wRfx NON REACTIVE NON REACTIVE    Comment: Performed at [facility], [address], [place], [place] [address]  Comprehensive metabolic panel     Status: Abnormal   Collection Time: 08/13/19  6:06 AM  Result Value Ref Range   Sodium 142 135 - 145 mmol/L   Potassium 4.5 3.5 - 5.1 mmol/L   Chloride 109 98 - 111 mmol/L   CO2 18 (L) 22 - 32 mmol/L   Glucose, Bld 215 (H) 70 - 99 mg/dL   BUN 22 (H) 6 - 20 mg/dL   Creatinine, Ser 2.82 (H) 0.44 - 1.00 mg/dL   Calcium 9.0 8.9 - 10.3 mg/dL   Total Protein 7.0 6.5 - 8.1 g/dL   Albumin 3.4 (L) 3.5 - 5.0 g/dL   AST 1,139 (H) 15 - 41 U/L   ALT 739 (H) 0 - 44 U/L   Alkaline Phosphatase 84 38 - 126 U/L   Total Bilirubin 0.8 0.3 - 1.2 mg/dL   GFR calc non Af Amer 19 (L) >60 mL/min   GFR calc Af Amer 22 (L) >60 mL/min   Anion gap 15 5 - 15    Comment: Performed at [facility], [place] [address], [place], [place] [address]  CBC     Status: Abnormal   Collection Time: 08/13/19  6:06 AM  Result Value Ref Range   WBC 16.0 (H) 4.0 - 10.5 K/uL   RBC 3.48 (L) 3.87 - 5.11 MIL/uL   Hemoglobin 9.9 (L) 12.0 - 15.0 g/dL   HCT 32.4 (L) 36.0 - 46.0 %   MCV 93.1 80.0 - 100.0 fL   MCH 28.4 26.0 - 34.0 pg   MCHC 30.6 30.0 - 36.0 g/dL   RDW 14.4 11.5 - 15.5 %   Platelets 203 150 - 400 K/uL   nRBC 0.8 (H) 0.0 - 0.2 %    Comment: Performed at [facility], [place] [address] [place], [place] [address]  HCV Ab Reflex to Quant PCR     Status: None   Collection Time: 08/13/19  6:06 AM   Result Value Ref Range   HCV Ab 0.1 0.0 - 0.9 s/co ratio    Comment: (NOTE) Performed At: [facility] [place], [place] [id number] Rush Farmer MD [id number]   Hepatitis B surface antibody     Status: Abnormal   Collection Time: 08/13/19  6:06 AM  Result Value Ref Range   Hepatitis B-Post <3.1 (L) Immunity>9.9 mIU/mL    Comment: (NOTE)  Status of Immunity                     Anti-HBs Level  ------------------                     -------------- Inconsistent with Immunity                   0.0 - 9.9 Consistent with Immunity                          >9.9 Performed At: [facility] [place], [place] [id number] Rush Farmer MD [id number]   Hepatitis B core antibody, total     Status: None   Collection Time: 08/13/19  6:06 AM  Result Value Ref Range   Hep B Core Total Ab NON REACTIVE NON REACTIVE    Comment: Performed at [facility], [place] [address], [place], [place] [address]  Hepatitis B surface antigen     Status: None   Collection Time: 08/13/19  6:06 AM  Result Value Ref Range   Hepatitis B Surface Ag NON REACTIVE NON REACTIVE    Comment: Performed at [facility] [address] [place], [place] [address]  Interpretation:     Status: None   Collection Time: 08/13/19  6:06 AM  Result Value Ref Range   HCV Interp 1: Comment     Comment: (NOTE) Negative Not infected with HCV, unless recent infection is suspected or other evidence exists to indicate HCV infection. Performed At: [facility] [place], [place] [id number] Rush Farmer MD [id number]   CBG monitoring, ED     Status: Abnormal   Collection Time: 08/13/19  6:52 AM  Result Value Ref Range   Glucose-Capillary 191 (H) 70 - 99 mg/dL  CBG monitoring, ED     Status: Abnormal   Collection Time: 08/13/19  7:57 AM  Result Value Ref Range   Glucose-Capillary 204 (H) 70 - 99 mg/dL   Comment 1 Notify RN    Comment 2 Document in  Chart   Protime-INR     Status: Abnormal   Collection Time: 08/13/19  8:11 AM  Result Value Ref Range   Prothrombin Time 15.5 (H) 11.4 - 15.2 seconds   INR 1.2 0.8 - 1.2    Comment: (NOTE) INR goal varies based on device and disease states. Performed at [facility], [place] [address], [place], [place] [address]   Hemoglobin and hematocrit, blood     Status: Abnormal   Collection Time: 08/13/19  8:11 AM  Result Value Ref Range   Hemoglobin 9.0 (L) 12.0 - 15.0 g/dL   HCT 28.8 (L) 36.0 - 46.0 %    Comment: Performed at [facility], [place] [address] [place], [place] [address]  Basic metabolic panel     Status: Abnormal   Collection Time: 08/13/19  9:00 AM  Result Value Ref Range   Sodium 141 135 - 145 mmol/L   Potassium 4.3 3.5 - 5.1 mmol/L   Chloride 110 98 - 111 mmol/L   CO2 19 (L) 22 - 32 mmol/L   Glucose, Bld 209 (H) 70 - 99 mg/dL   BUN 25 (H) 6 - 20 mg/dL   Creatinine, Ser 3.10 (H) 0.44 - 1.00 mg/dL   Calcium 8.7 (L) 8.9 - 10.3 mg/dL   GFR calc non Af Amer 17 (L) >60 mL/min   GFR calc Af Amer 20 (L) >60 mL/min   Anion gap 12 5 - 15    Comment: Performed at [facility] [address], [place], [place] [address]  Type and screen [place]     Status: None   Collection Time: 08/13/19  9:00 AM  Result Value Ref Range   ABO/RH(D) O POS    Antibody Screen NEG    Sample Expiration      08/16/2019,2359 Performed at [facility], [place] [address], [place],  [address]   ABO/Rh     Status: None   Collection Time: 08/13/19  9:00 AM  Result Value Ref Range   ABO/RH(D)      O POS Performed at [facility]  [address], [place], [place] [address]   CBG monitoring, ED     Status: Abnormal   Collection Time: 08/13/19  9:26 AM  Result Value Ref Range   Glucose-Capillary 192 (H) 70 - 99 mg/dL   Comment 1 Notify RN    Comment 2 Document in Chart   CBG monitoring, ED     Status: Abnormal   Collection Time: 08/13/19 10:58 AM   Result Value Ref Range   Glucose-Capillary 183 (H) 70 - 99 mg/dL   Comment 1 Notify RN    Comment 2 Document in Chart   CBG monitoring, ED     Status: Abnormal   Collection Time: 08/13/19 12:10 PM  Result Value Ref Range   Glucose-Capillary 159 (H) 70 - 99 mg/dL   Comment 1 Notify RN    Comment 2 Document in Chart   CBG monitoring, ED     Status: Abnormal   Collection Time: 08/13/19  1:20 PM  Result Value Ref Range   Glucose-Capillary 213 (H) 70 - 99 mg/dL   Comment 1 Notify RN    Comment 2 Document in Chart   Hemoglobin and hematocrit, blood     Status: Abnormal   Collection Time: 08/13/19  2:20 PM  Result Value Ref Range   Hemoglobin 8.7 (L) 12.0 - 15.0 g/dL   HCT 28.2 (L) 36.0 - 46.0 %    Comment: Performed at [facility], [place] [address] [place], [place] [address]  CBG monitoring, ED     Status: Abnormal   Collection Time: 08/13/19  2:25 PM  Result Value Ref Range   Glucose-Capillary 174 (H) 70 - 99 mg/dL   Comment 1 Notify RN    Comment 2 Document in Chart   Glucose, capillary     Status: Abnormal   Collection Time: 08/13/19  3:54 PM  Result Value Ref Range   Glucose-Capillary 130 (H) 70 - 99 mg/dL  Basic metabolic panel     Status: Abnormal   Collection Time: 08/13/19  4:40 PM  Result Value Ref Range   Sodium 143 135 - 145 mmol/L   Potassium 4.4 3.5 - 5.1 mmol/L   Chloride 113 (H) 98 - 111 mmol/L   CO2 19 (L) 22 - 32 mmol/L   Glucose, Bld 188 (H) 70 - 99 mg/dL   BUN 29 (H) 6 - 20 mg/dL   Creatinine, Ser 3.68 (H) 0.44 - 1.00 mg/dL   Calcium 8.4 (L) 8.9 - 10.3 mg/dL   GFR calc non Af Amer 14 (L) >60 mL/min   GFR calc Af Amer 16 (L) >60 mL/min   Anion gap 11 5 - 15    Comment: Performed at [facility] [address], [place], [place] [address]  Beta-hydroxybutyric acid     Status: None   Collection Time: 08/13/19  4:40 PM  Result Value Ref Range   Beta-Hydroxybutyric Acid 0.10 0.05 - 0.27 mmol/L    Comment: Performed at [facility] [address], [place], [place] [address]  Glucose, capillary     Status: Abnormal   Collection Time: 08/13/19  5:17 PM  Result Value Ref Range   Glucose-Capillary 170 (H) 70 - 99 mg/dL  Glucose, capillary     Status: Abnormal   Collection Time: 08/13/19  7:00 PM  Result Value Ref Range   Glucose-Capillary 177 (H) 70 - 99 mg/dL  Hemoglobin and hematocrit, blood     Status: Abnormal   Collection Time: 08/13/19  8:20  PM  Result Value Ref Range   Hemoglobin 7.8 (L) 12.0 - 15.0 g/dL   HCT 25.3 (L) 36.0 - 46.0 %    Comment: Performed at [facility] [address] [place], [place] [address]  Glucose, capillary     Status: Abnormal   Collection Time: 08/13/19  9:01 PM  Result Value Ref Range   Glucose-Capillary 168 (H) 70 - 99 mg/dL  Glucose, capillary     Status: Abnormal   Collection Time: 08/13/19 10:00 PM  Result Value Ref Range   Glucose-Capillary 181 (H) 70 - 99 mg/dL  Glucose, capillary     Status: Abnormal   Collection Time: 08/13/19 10:09 PM  Result Value Ref Range   Glucose-Capillary 199 (H) 70 - 99 mg/dL  Glucose, capillary     Status: Abnormal   Collection Time: 08/13/19 11:26 PM  Result Value Ref Range   Glucose-Capillary 187 (H) 70 - 99 mg/dL  Glucose, capillary     Status: Abnormal   Collection Time: 08/14/19 12:30 AM  Result Value Ref Range   Glucose-Capillary 156 (H) 70 - 99 mg/dL  Glucose, capillary     Status: Abnormal   Collection Time: 08/14/19  1:24 AM  Result Value Ref Range   Glucose-Capillary 189 (H) 70 - 99 mg/dL  Hemoglobin and hematocrit, blood     Status: Abnormal   Collection Time: 08/14/19  1:57 AM  Result Value Ref Range   Hemoglobin 7.8 (L) 12.0 - 15.0 g/dL   HCT 25.2 (L) 36.0 - 46.0 %    Comment: Performed at [facility], [place] [address], [place], [place] [address]  Glucose, capillary     Status: Abnormal   Collection Time: 08/14/19  3:06 AM  Result Value Ref Range   Glucose-Capillary 198 (H) 70 - 99 mg/dL  Hepatic function panel      Status: Abnormal   Collection Time: 08/14/19  4:12 AM  Result Value Ref Range   Total Protein 6.2 (L) 6.5 - 8.1 g/dL   Albumin 3.0 (L) 3.5 - 5.0 g/dL   AST 537 (H) 15 - 41 U/L   ALT 487 (H) 0 - 44 U/L   Alkaline Phosphatase 93 38 - 126 U/L   Total Bilirubin 0.8 0.3 - 1.2 mg/dL   Bilirubin, Direct 0.2 0.0 - 0.2 mg/dL   Indirect Bilirubin 0.6 0.3 - 0.9 mg/dL    Comment: Performed at [facility], [place] [address] [place], [place] [address]  CBC     Status: Abnormal   Collection Time: 08/14/19  4:12 AM  Result Value Ref Range   WBC 14.4 (H) 4.0 - 10.5 K/uL   RBC 2.74 (L) 3.87 - 5.11 MIL/uL   Hemoglobin 7.9 (L) 12.0 - 15.0 g/dL   HCT 25.6 (L) 36.0 - 46.0 %   MCV 93.4 80.0 - 100.0 fL   MCH 28.8 26.0 - 34.0 pg   MCHC 30.9 30.0 - 36.0 g/dL   RDW 14.2 11.5 - 15.5 %   Platelets 138 (L) 150 - 400 K/uL   nRBC 0.3 (H) 0.0 - 0.2 %    Comment: Performed at [place] [address], [place], [place] [address]  Glucose, capillary     Status: Abnormal   Collection Time: 08/14/19  4:46 AM  Result Value Ref Range   Glucose-Capillary 175 (H) 70 - 99 mg/dL  Blood gas, arterial     Status: Abnormal   Collection Time: 08/14/19  5:17 AM  Result Value Ref Range   FIO2 28.00    pH, Arterial 7.285 (L) 7.350 - 7.450   pCO2 arterial 38.9 32.0 - 48.0 mmHg   pO2, Arterial 88.3 83.0 - 108.0 mmHg   Bicarbonate 18.0 (L) 20.0 - 28.0 mmol/L   Acid-base deficit 7.5 (H) 0.0 - 2.0 mmol/L   O2 Saturation 96.0 %   Patient temperature 36.8    Collection site RIGHT RADIAL    Drawn by [id number]     Comment: COLLECTED BY RT   Sample type ARTERIAL DRAW    Allens test (pass/fail) PASS PASS    Comment: Performed at [facility], [place] [address] [place], [place] [address]  Glucose, capillary     Status: Abnormal   Collection Time: 08/14/19  5:26 AM  Result Value Ref Range   Glucose-Capillary 162 (H) 70 - 99 mg/dL  CBC with Differential/Platelet     Status: Abnormal   Collection Time: 08/14/19   5:30 AM  Result Value Ref Range   WBC 14.5 (H) 4.0 - 10.5 K/uL   RBC 2.77 (L) 3.87 - 5.11 MIL/uL   Hemoglobin 7.9 (L) 12.0 - 15.0 g/dL   HCT 25.8 (L) 36.0 - 46.0 %   MCV 93.1 80.0 - 100.0 fL   MCH 28.5 26.0 - 34.0 pg   MCHC 30.6 30.0 - 36.0 g/dL   RDW 14.2 11.5 - 15.5 %   Platelets 139 (L) 150 - 400 K/uL   nRBC 0.4 (H) 0.0 - 0.2 %   Neutrophils Relative % 78 %   Neutro Abs 11.4 (H) 1.7 - 7.7 K/uL   Lymphocytes Relative 11 %   Lymphs Abs 1.5 0.7 - 4.0 K/uL   Monocytes Relative 10 %   Monocytes Absolute 1.5 (H) 0.1 - 1.0 K/uL   Eosinophils Relative 0 %   Eosinophils Absolute 0.0 0.0 - 0.5 K/uL   Basophils Relative 0 %   Basophils Absolute 0.0 0.0 - 0.1 K/uL   Immature Granulocytes 1 %   Abs Immature Granulocytes 0.13 (H) 0.00 - 0.07 K/uL    Comment: Performed at [facility], [address], [place], [place] [address]  Glucose, capillary     Status: Abnormal   Collection Time: 08/14/19  6:42 AM  Result Value Ref Range   Glucose-Capillary 166 (H) 70 - 99 mg/dL  Glucose, capillary     Status: Abnormal   Collection Time: 08/14/19  7:27 AM  Result Value Ref Range   Glucose-Capillary 163 (H) 70 - 99 mg/dL  Glucose, capillary     Status: Abnormal   Collection Time: 08/14/19  7:58 AM  Result Value Ref Range   Glucose-Capillary 156 (H) 70 - 99 mg/dL  Basic metabolic panel     Status: Abnormal   Collection Time: 08/14/19  8:20 AM  Result Value Ref Range   Sodium 140 135 - 145 mmol/L   Potassium 4.4 3.5 - 5.1 mmol/L   Chloride 111 98 - 111 mmol/L   CO2 21 (L) 22 - 32 mmol/L   Glucose, Bld 184 (H) 70 - 99 mg/dL   BUN 37 (H) 6 - 20 mg/dL   Creatinine, Ser 4.49 (H) 0.44 - 1.00 mg/dL   Calcium 8.2 (L) 8.9 - 10.3 mg/dL   GFR calc non Af Amer 11 (L) >60 mL/min   GFR calc Af Amer 13 (L) >60 mL/min   Anion gap 8 5 - 15    Comment: Performed at [facility], [place]  [address], [place], [place] [address]  Glucose, capillary     Status: Abnormal   Collection Time: 08/14/19   9:06 AM  Result Value Ref Range   Glucose-Capillary 173 (H) 70 - 99 mg/dL  Glucose, capillary     Status: Abnormal   Collection Time: 08/14/19 10:23 AM  Result Value Ref Range   Glucose-Capillary 174 (H) 70 - 99 mg/dL  Glucose, capillary     Status: Abnormal   Collection Time: 08/14/19 11:10 AM  Result Value Ref Range   Glucose-Capillary 180 (H) 70 - 99 mg/dL  Glucose, capillary     Status: Abnormal   Collection Time: 08/14/19 12:52 PM  Result Value Ref Range   Glucose-Capillary 198 (H) 70 - 99 mg/dL  Glucose, capillary     Status: Abnormal   Collection Time: 08/14/19  2:00 PM  Result Value Ref Range   Glucose-Capillary 180 (H) 70 - 99 mg/dL  Glucose, capillary     Status: Abnormal   Collection Time: 08/14/19  2:50 PM  Result Value Ref Range   Glucose-Capillary 165 (H) 70 - 99 mg/dL  Glucose, capillary     Status: Abnormal   Collection Time: 08/14/19  4:05 PM  Result Value Ref Range   Glucose-Capillary 159 (H) 70 - 99 mg/dL  Glucose, capillary     Status: Abnormal   Collection Time: 08/14/19  6:09 PM  Result Value Ref Range   Glucose-Capillary 149 (H) 70 - 99 mg/dL     ROS:  Pertinent items noted in HPI and remainder of comprehensive ROS otherwise negative.  Physical Exam: Vitals:   08/14/19 1148 08/14/19 1532  BP: (!) 143/78   Pulse: (!) 102 100  Resp: 20   Temp: 98.9 F (37.2 C)   SpO2: 98% 98%     General exam: Morbidly obese female lying on bed, on 2 L of oxygen. Respiratory system: Distant breath sound, no crackle or wheeze appreciated.  No increased work of breathing Cardiovascular system: S1 & S2 heard, RRR.  No pedal edema. Gastrointestinal system: Abdomen is soft and nontender. Normal bowel sounds heard. Central nervous system: Alert and oriented. No focal neurological deficits. Extremities: Symmetric 5 x 5 power. Skin: No rashes, lesions or ulcers Psychiatry: Judgement and insight appear normal. Mood & affect appropriate.  GU: Urinary bag with clear  urine.  Assessment/Plan:  #Acute kidney injury, nonoliguric: Multifactorial etiology including ATN due to hypotension/DKA/ anemia and contrast nephropathy. She has urine output of around 900 cc after placement of Foley catheter. CT scan ruled out hydronephrosis Currently on IV fluid/sodium bicarbonate for the treatment of DKA. Blood pressure is improved. Expect the creatinine level will plateau in next 1-2 days.  Continue to monitor BMP, urine output.  Avoid hypotensive episode, nephrotoxins including IV contrast. Watch for sign of fluid overload especially given history of CHF. No need for dialysis at this time.  #Hypotension: Blood pressure improved with IV fluid. Noted that the BP medications were resumed. Monitor BP.  #Anemia due to acute blood loss: Presumably bleeding from liver mass/hemoperitoneum.  GI is following.  Discussion ongoing to transfer to [facility] liver [facility].  Plan for MRI abdomen noted.  Recommend to avoid contrast because of low GFR.  #Anion gap acidosis due to DKA/renal failure: Currently on sodium bicarbonate as well as insulin drip and IV fluid.  #CHF: EF of 45 to 50%.  #History of lupus/Sjogren's syndrome.  Currently on steroid, Plaquenil and Imuran.  Thank you for the consult.  We will follow with you.  Dalena Plantz Tanna Furry 08/14/2019, 6:19 PM  [facility].

## 2019-08-14 NOTE — Progress Notes (Addendum)
MEDICATION RELATED CONSULT NOTE - INITIAL   Pharmacy Consult for renal adjustment of medications Indication: Acute kidney injury  Medications reviewed for renal dose adjustments due to worsening renal function.   Pregabalin reduced to 75mg  bid Famotidine to 20mg  daily Could consider reduction in azathioprine if scr continues to worsen  Pharmacy will continue to follow   Erin Hearing PharmD., BCPS Clinical Pharmacist 08/14/2019 10:50 AM

## 2019-08-14 NOTE — Significant Event (Addendum)
Rapid Response Event Note  Overview: Increased WOB with accessory muscle use and lethargy  Initial Focused Assessment: Juhi RN notified me of increased lethargy and a change in her breathing pattern. Upon arrival, Ms. Lorden is stuporous, arousable to light physical touch, can follow simple commands like hold up two fingers and stick tongue out.  When asked if she was having trouble getting her breath, she nodded yes. When not stimulated she goes right back to sleep. BBS clear anteriorly but diminished bilaterally. She has a moderate amount of prolonged exhalation and abdominal accessory muscle use. On Endotool, CBG 198. M. Sharlet Salina notified by nursing staff and orders received for [facility]. Due to metabolic acidosis and lethargy, will obtain ABG. No urine output documented since 1800. Bladder scan is [id number] however complicated by body habitus. Dilaudid 0.5mg  given at 0347 and prior dose at around 2100. Poor urine output, morbid obesity and narcotics probable explanation for lethargy and change in breathing pattern. Will administer Narcan if she becomes more lethargic or unable to follow commands. Based on the ABG, pt does not require BIPAP at this time. 0419- 98.3 F, HR 102 ST, BP 121/83, RR 14 with sats100% on 2L [place].   Interventions: -ABG (7.28/39/89/18)  Plan of Care (if not transferred): -May administer Narcan -May use BIPAP temporarily if hypercarbic -Notify primary svc for an order for I&O cath to eval accurate output.  -Notify primary svc and/or RRRN for further assistance.   Event Summary: Call received 0413 Arrived 0420 Call ended 0630  Madelynn Done

## 2019-08-15 ENCOUNTER — Other Ambulatory Visit: Payer: Self-pay

## 2019-08-15 ENCOUNTER — Encounter (HOSPITAL_COMMUNITY): Payer: Self-pay | Admitting: Internal Medicine

## 2019-08-15 LAB — URINALYSIS, ROUTINE W REFLEX MICROSCOPIC
Bilirubin Urine: NEGATIVE
Glucose, UA: >=500 mg/dL — AB
Hgb urine dipstick: NEGATIVE
Ketones, ur: NEGATIVE mg/dL
Leukocytes,Ua: NEGATIVE
Nitrite: NEGATIVE
Protein, ur: 30 mg/dL — AB
Specific Gravity, Urine: 1.027 (ref 1.005–1.030)
pH: 6 (ref 5.0–8.0)

## 2019-08-15 LAB — GLUCOSE, CAPILLARY
Glucose-Capillary: 159 mg/dL — ABNORMAL HIGH (ref 70–99)
Glucose-Capillary: 179 mg/dL — ABNORMAL HIGH (ref 70–99)
Glucose-Capillary: 171 mg/dL — ABNORMAL HIGH (ref 70–99)
Glucose-Capillary: 156 mg/dL — ABNORMAL HIGH (ref 70–99)
Glucose-Capillary: 148 mg/dL — ABNORMAL HIGH (ref 70–99)
Glucose-Capillary: 167 mg/dL — ABNORMAL HIGH (ref 70–99)
Glucose-Capillary: 168 mg/dL — ABNORMAL HIGH (ref 70–99)
Glucose-Capillary: 150 mg/dL — ABNORMAL HIGH (ref 70–99)
Glucose-Capillary: 150 mg/dL — ABNORMAL HIGH (ref 70–99)
Glucose-Capillary: 162 mg/dL — ABNORMAL HIGH (ref 70–99)
Glucose-Capillary: 148 mg/dL — ABNORMAL HIGH (ref 70–99)
Glucose-Capillary: 149 mg/dL — ABNORMAL HIGH (ref 70–99)
Glucose-Capillary: 157 mg/dL — ABNORMAL HIGH (ref 70–99)
Glucose-Capillary: 154 mg/dL — ABNORMAL HIGH (ref 70–99)
Glucose-Capillary: 189 mg/dL — ABNORMAL HIGH (ref 70–99)
Glucose-Capillary: 172 mg/dL — ABNORMAL HIGH (ref 70–99)
Glucose-Capillary: 162 mg/dL — ABNORMAL HIGH (ref 70–99)
Glucose-Capillary: 156 mg/dL — ABNORMAL HIGH (ref 70–99)

## 2019-08-15 LAB — COMPREHENSIVE METABOLIC PANEL
ALT: 305 U/L — ABNORMAL HIGH (ref 0–44)
AST: 196 U/L — ABNORMAL HIGH (ref 15–41)
Albumin: 2.8 g/dL — ABNORMAL LOW (ref 3.5–5.0)
Alkaline Phosphatase: 106 U/L (ref 38–126)
Anion gap: 8 (ref 5–15)
BUN: 36 mg/dL — ABNORMAL HIGH (ref 6–20)
CO2: 22 mmol/L (ref 22–32)
Calcium: 8.1 mg/dL — ABNORMAL LOW (ref 8.9–10.3)
Chloride: 110 mmol/L (ref 98–111)
Creatinine, Ser: 3.14 mg/dL — ABNORMAL HIGH (ref 0.44–1.00)
GFR calc Af Amer: 19 mL/min — ABNORMAL LOW (ref >60)
GFR calc non Af Amer: 17 mL/min — ABNORMAL LOW (ref >60)
Glucose, Bld: 169 mg/dL — ABNORMAL HIGH (ref 70–99)
Potassium: 4 mmol/L (ref 3.5–5.1)
Sodium: 140 mmol/L (ref 135–145)
Total Bilirubin: 1 mg/dL (ref 0.3–1.2)
Total Protein: 6.2 g/dL — ABNORMAL LOW (ref 6.5–8.1)

## 2019-08-15 LAB — CBC
HCT: 23.2 % — ABNORMAL LOW (ref 36.0–46.0)
Hemoglobin: 7.3 g/dL — ABNORMAL LOW (ref 12.0–15.0)
MCH: 29 pg (ref 26.0–34.0)
MCHC: 31.5 g/dL (ref 30.0–36.0)
MCV: 92.1 fL (ref 80.0–100.0)
Platelets: 127 10*3/uL — ABNORMAL LOW (ref 150–400)
RBC: 2.52 MIL/uL — ABNORMAL LOW (ref 3.87–5.11)
RDW: 13.8 % (ref 11.5–15.5)
WBC: 14.5 10*3/uL — ABNORMAL HIGH (ref 4.0–10.5)
nRBC: 0.3 % — ABNORMAL HIGH (ref 0.0–0.2)

## 2019-08-15 LAB — HEMOGLOBIN AND HEMATOCRIT, BLOOD
HCT: 23.7 % — ABNORMAL LOW (ref 36.0–46.0)
HCT: 25.2 % — ABNORMAL LOW (ref 36.0–46.0)
Hemoglobin: 7.4 g/dL — ABNORMAL LOW (ref 12.0–15.0)
Hemoglobin: 7.9 g/dL — ABNORMAL LOW (ref 12.0–15.0)

## 2019-08-15 LAB — PROTIME-INR
INR: 1.3 — ABNORMAL HIGH (ref 0.8–1.2)
Prothrombin Time: 15.7 seconds — ABNORMAL HIGH (ref 11.4–15.2)

## 2019-08-15 NOTE — Progress Notes (Signed)
Subjective:  1500 of UOP recorded overnight but was nearly 3 liters positive in terms of fluid balance. crt down this AM-  hgb 7.9 to 7.3 last 24 hours.  She still reports nausea and belly pain  Objective Vital signs in last 24 hours: Vitals:   08/15/19 0420 08/15/19 0427 08/15/19 0823 08/15/19 0831  BP:  129/76 115/64 124/64  Pulse: 100 99 98   Resp: 17 19 18    Temp:  (!) 97.5 F (36.4 C) 98 F (36.7 C)   TempSrc:  Oral Oral   SpO2: 99% 100% 100%   Weight:  (!) 194.7 kg    Height:       Weight change: 3.8 kg  Intake/Output Summary (Last 24 hours) at 08/15/2019 0906 Last data filed at 08/15/2019 [id number] Gross per 24 hour  Intake 4391.62 ml  Output 1500 ml  Net 2891.62 ml    Assessment/ Plan: Pt is a 49 y.o. yo female morbid obesity, SLE and Sjogren's, CHF, DM  who was admitted on 08/12/2019 with a bleeding liver mass-  Developed AKI in the hospital  Assessment/Plan: 1. Renal-  AKI (crt 1.28 on 1/11)  In the setting of hemodynamic instability/contrast and possibly some bladder out let obstruction (had really strong out put immediately after foley but imaging did not show hydo),  crt peaked at 4.5 - is 3/14 today and is nonoliguric.  Hope that kidney function will cont to improve as dialysis would not be possible given her size 2. HTN/vol-  Very difficult to determine volume status.  I suspect may be a little overloaded now.  Would not give any more fluid boluses-  Ok to cont bicarb until no longer needed.  She is on quite a bit of BP meds-  norvasc 5/bidil 2 tabs TID and coreg.  Be careful about BP drop again  3. Anemia- ABLA-  Bleeding into liver mass.  Per primary and GI-  Transfusions as needed  4.  Bleeding liver mass- unknown etiology. Possible transfer to [place] in the works    [place]: Basic Metabolic Panel: Recent Labs  Lab 08/13/19 1640 08/14/19 0820 08/15/19 0432  NA 143 140 140  K 4.4 4.4 4.0  CL 113* 111 110  CO2 19* 21* 22  GLUCOSE 188*  184* 169*  BUN 29* 37* 36*  CREATININE 3.68* 4.49* 3.14*  CALCIUM 8.4* 8.2* 8.1*   Liver Function Tests: Recent Labs  Lab 08/13/19 0606 08/14/19 0412 08/15/19 0432  AST 1,139* 537* 196*  ALT 739* 487* 305*  ALKPHOS 84 93 106  BILITOT 0.8 0.8 1.0  PROT 7.0 6.2* 6.2*  ALBUMIN 3.4* 3.0* 2.8*   Recent Labs  Lab 08/12/19 1332  LIPASE 18   No results for input(s): AMMONIA in the last 168 hours. CBC: Recent Labs  Lab 08/12/19 1332 08/12/19 1523 08/13/19 0606 08/13/19 0811 08/14/19 0412 08/14/19 0530 08/15/19 0432  WBC 12.5*   < > 16.0*   < > 14.4* 14.5* 14.5*  NEUTROABS  --   --   --   --   --  11.4*  --   HGB 10.9*   < > 9.9*   < > 7.9* 7.9* 7.3*  HCT 35.6*   < > 32.4*   < > 25.6* 25.8* 23.2*  MCV 92.7  --  93.1  --  93.4 93.1 92.1  PLT 271   < > 203   < > 138* 139* 127*   < > = values in  this interval not displayed.   Cardiac Enzymes: No results for input(s): CKTOTAL, CKMB, CKMBINDEX, TROPONINI in the last 168 hours. CBG: Recent Labs  Lab 08/15/19 0211 08/15/19 0322 08/15/19 0530 08/15/19 0750 08/15/19 0820  GLUCAP 179* 171* 156* 167* 148*    Iron Studies: No results for input(s): IRON, TIBC, TRANSFERRIN, FERRITIN in the last 72 hours. Studies/Results: DG CHEST PORT 1 VIEW  Result Date: 08/14/2019 CLINICAL DATA:  Shortness of breath EXAM: PORTABLE CHEST 1 VIEW COMPARISON:  Yesterday FINDINGS: Cardiomegaly and vascular pedicle widening. Congested appearance of vessels which may have progressed. Right IJ line with tip at the SVC. No visible effusion or pneumothorax. IMPRESSION: Cardiomegaly with vascular congestion that may have progressed from yesterday. Electronically Signed   By: Monte Fantasia M.D.   On: 08/14/2019 05:17   DG Chest Portable 1 View  Result Date: 08/13/2019 CLINICAL DATA:  Post central line placement. EXAM: PORTABLE CHEST 1 VIEW COMPARISON:  Chest radiograph 08/12/2019 FINDINGS: Interval placement of a right IJ approach central venous  catheter with tip projecting in the region of the superior vena cava. Overlying cardiac monitoring leads. Unchanged mild cardiomegaly. Shallow inspiration radiograph. Persistent bibasilar opacities which may reflect atelectasis or pneumonia. No definite pleural effusion or evidence of pneumothorax. IMPRESSION: Interval placement of a right IJ approach central venous catheter with tip projecting in the region of the superior vena cava. Persistent low lung volumes and bibasilar opacities which may reflect atelectasis and/or pneumonia. Electronically Signed   By:  Simmering DO   On: 08/13/2019 09:17   Medications: Infusions: . sodium chloride Stopped (08/13/19 1401)  . sodium chloride 4.4 mL/hr at 08/15/19 0544  . dextrose 5 % and 0.45% NaCl 75 mL/hr at 08/15/19 0651  . insulin 4.2 Units/hr (08/15/19 0823)  . sodium bicarbonate 150 mEq in dextrose 5% 1000 mL 50 mL/hr at 08/15/19 0651    Scheduled Medications: . amLODipine  5 mg Oral Daily  . azaTHIOprine  100 mg Oral BID  . buPROPion  300 mg Oral Daily  . carvedilol  12.5 mg Oral BID WC  . Chlorhexidine Gluconate Cloth  6 each Topical Daily  . cycloSPORINE  1 drop Both Eyes BID  . DULoxetine  120 mg Oral QHS  . famotidine  20 mg Oral Daily  . hydroxychloroquine  200 mg Oral BID  . isosorbide-hydrALAZINE  2 tablet Oral TID  . loratadine  10 mg Oral Daily  . megestrol  40 mg Oral Daily  . mometasone-formoterol  2 puff Inhalation BID  . oxybutynin  15 mg Oral Daily  . predniSONE  3 mg Oral Daily  . pregabalin  75 mg Oral BID  . psyllium  1 packet Oral BID  . sodium chloride flush  10-40 mL Intracatheter Q12H  . sucralfate  1 g Oral BID  . topiramate  75 mg Oral QHS    have reviewed scheduled and prn medications.  Physical Exam: General: alert, c/o nausea and abd pain Heart: tachy Lungs: diff exam- on 3 L [place] Abdomen: obese, diffusely tender Extremities: pitting edema to dep areas    08/15/2019,9:06 AM  LOS: 3 days

## 2019-08-15 NOTE — Progress Notes (Signed)
Daily Rounding Note  08/15/2019, 11:38 AM  LOS: 3 days   SUBJECTIVE:   Chief complaint: Liver masses.  Elevated LFTs.  1.5 L recorded urine output last 24 hours.  Vitals stable.   Still has nausea, belly pain in RUQ around to back.  Tolerating clears.  Currently NPO for MRI No BM's.    OBJECTIVE:         Vital signs in last 24 hours:    Temp:  [97.5 F (36.4 C)-98.9 F (37.2 C)] 98 F (36.7 C) (01/14 0823) Pulse Rate:  [95-112] 98 (01/14 0942) Resp:  [11-30] 18 (01/14 0823) BP: (97-152)/(64-84) 121/75 (01/14 0942) SpO2:  [81 %-100 %] 100 % (01/14 0942) Weight:  [194.7 kg] 194.7 kg (01/14 0427) Last BM Date: 08/12/19 Filed Weights   08/13/19 0038 08/14/19 0419 08/15/19 0427  Weight: (!) 178.7 kg (!) 190.9 kg (!) 194.7 kg   General: more alert.  Comfortable.  Looks unwell   Heart: RRR Chest: clear but moderately labored breathing with expiratory grunting Abdomen: obese,  Tender RUQ w/o guard/rebound.  BS active.  obese Extremities: no pitting edema Neuro/Psych:  More alert.  Oriented x 3.  No tremors or gross deficits.  Laconic but answers all question accurately.    Intake/Output from previous day: 01/13 0701 - 01/14 0700 In: 4391.6 [P.O.:490; I.V.:3901.6] Out: 1500 [Urine:1500]  Intake/Output this shift: No intake/output data recorded.  Lab Results: Recent Labs    08/14/19 0412 08/14/19 0530 08/15/19 0432  WBC 14.4* 14.5* 14.5*  HGB 7.9* 7.9* 7.3*  HCT 25.6* 25.8* 23.2*  PLT 138* 139* 127*   BMET Recent Labs    08/13/19 1640 08/14/19 0820 08/15/19 0432  NA 143 140 140  K 4.4 4.4 4.0  CL 113* 111 110  CO2 19* 21* 22  GLUCOSE 188* 184* 169*  BUN 29* 37* 36*  CREATININE 3.68* 4.49* 3.14*  CALCIUM 8.4* 8.2* 8.1*   LFT Recent Labs    08/13/19 0606 08/14/19 0412 08/15/19 0432  PROT 7.0 6.2* 6.2*  ALBUMIN 3.4* 3.0* 2.8*  AST 1,139* 537* 196*  ALT 739* 487* 305*  ALKPHOS 84 93 106    BILITOT 0.8 0.8 1.0  BILIDIR  --  0.2  --   IBILI  --  0.6  --    PT/INR Recent Labs    08/13/19 0811 08/15/19 0432  LABPROT 15.5* 15.7*  INR 1.2 1.3*   Hepatitis Panel Recent Labs    08/13/19 0606  HEPBSAG NON REACTIVE    Studies/Results: DG CHEST PORT 1 VIEW  Result Date: 08/14/2019 CLINICAL DATA:  Shortness of breath EXAM: PORTABLE CHEST 1 VIEW COMPARISON:  Yesterday FINDINGS: Cardiomegaly and vascular pedicle widening. Congested appearance of vessels which may have progressed. Right IJ line with tip at the SVC. No visible effusion or pneumothorax. IMPRESSION: Cardiomegaly with vascular congestion that may have progressed from yesterday. Electronically Signed   By: Monte Fantasia M.D.   On: 08/14/2019 05:17    ASSESMENT:   *  Liver masses, suspect multifocal hepatic adenomas with small volume hemoperitoneum.  Hepatic adenomas treated w RFA in 2014.   Suspect element of shock liver.   LFTs continue to improve.  INR 1.3.    *   DKA.  Improved.    *   AKI.  Persists but improved.  Good urine output from Foley catheter.   *   Anemia.  Normocytic. Relatively stable Hgb 7.9 >> 7.3 after initial post  hydration decline.    *    Thrombocytopenia.     *   Sjogrens, lupus.  On Plaquenil, Imuran, prednisone. Chronic Plavix for hx CVA, on hold.   Morbid obesity.     PLAN   *  MRI abdomen ordered.      Azucena Freed  08/15/2019, 11:38 AM Phone [phone]

## 2019-08-15 NOTE — Progress Notes (Signed)
Physical Therapy Treatment Patient Details Name: [name] MRN: [id number] DOB: [birth year] Today's Date: 08/15/2019    History of Present Illness Pt is a 49 yo female presenting with nausea and vomiting, found to have acute liver and kidney injury, metabolic acidosis, and hepatic adnenoma. PMH includes morbid obesity, DM, lupus, CVA, OSA, chronic pain.    PT Comments    Pt with improved participation in PT this session, ambulating short room distance and getting OOB to chair. Pt requires very increased time to mobilize, and pt states "it never takes me this long to get out of bed". Pt somewhat limited by dizziness, which pt describes as both room-spinning and lightheadedness. PT feels this is due to relative immobility over the past couple of days, BP stable. PT spoke with RN post-PT, and recommended +2 for back to bed. PT to continue to follow acutely and will progress mobility as tolerated.   Follow Up Recommendations  Home health PT;Supervision/Assistance - 24 hour     Equipment Recommendations  (continue to assess RW vs WC)    Recommendations for Other Services       Precautions / Restrictions Precautions Precautions: Fall Restrictions Weight Bearing Restrictions: No    Mobility  Bed Mobility Overal bed mobility: Needs Assistance Bed Mobility: Supine to Sit     Supine to sit: Mod assist     General bed mobility comments: Mod assist for supine to sit for trunk elevation with HHA, LE lifting and translation to EOB, and scooting to EOB with use of bedsheet. Pt with very increased time to perform, with use of bedrails to assist in trunk elevation and scooting.  Transfers Overall transfer level: Needs assistance Equipment used: Rolling walker (2 wheeled) Transfers: Sit to/from [facility] Sit to Stand: Mod assist;From elevated surface Stand pivot transfers: Mod assist;From elevated surface       General transfer comment: Mod assist for power up with  L shoulder girdle and trunk assist for power up, steadying. Pt using RW in standing to assist with self-steady. Pt reporting dizziness upon standing, returned to sit with BP 134/78 when checked. Pt reported subsiding dizziness with continued sitting EOB x5 minutes. Sit to stand x2 from EOB.  Ambulation/Gait Ambulation/Gait assistance: Min assist Gait Distance (Feet): 5 Feet Assistive device: Rolling walker (2 wheeled) Gait Pattern/deviations: Step-through pattern;Wide base of support;Trunk flexed;Decreased stride length Gait velocity: decr   General Gait Details: Min assist for steadying, verbal cuing for stepping closer to RW and upright posture. Pt limited in distance by dizziness and weakness.   Stairs             Wheelchair Mobility    Modified Rankin (Stroke Patients Only)       Balance Overall balance assessment: Needs assistance Sitting-balance support: Feet supported;No upper extremity supported Sitting balance-Leahy Scale: Fair       Standing balance-Leahy Scale: Poor Standing balance comment: reliant on external support, PT assist                            Cognition Arousal/Alertness: Awake/alert(drowsy) Behavior During Therapy: Flat affect;WFL for tasks assessed/performed Overall Cognitive Status: Difficult to assess                                 General Comments: Pt with short responses, periods of closing eyes and pt-reported fatigue      Exercises General Exercises -  Lower Extremity Ankle Circles/Pumps: AROM;Both;10 reps;Seated Quad Sets: AROM;Both;5 reps;Seated Hip Flexion/Marching: AROM;Both;10 reps;Seated    General Comments General comments (skin integrity, edema, etc.): VSS, RR up to 30 breaths/min with exertion      Pertinent Vitals/Pain Pain Assessment: Faces Faces Pain Scale: Hurts little more Pain Location: abdomen, back Pain Descriptors / Indicators: Discomfort;Sore Pain Intervention(s): Limited activity  within patient's tolerance;Monitored during session;Repositioned    Home Living Family/patient expects to be discharged to:: Private residence Living Arrangements: Other (Comment)(roommate)                  Prior Function            PT Goals (current goals can now be found in the care plan section) Acute Rehab PT Goals Patient Stated Goal: return home PT Goal Formulation: With patient Time For Goal Achievement: 08/28/19 Potential to Achieve Goals: Fair Progress towards PT goals: Progressing toward goals    Frequency    Min 3X/week      PT Plan Current plan remains appropriate    Co-evaluation              AM-PAC PT "6 Clicks" Mobility   Outcome Measure  Help needed turning from your back to your side while in a flat bed without using bedrails?: A Little Help needed moving from lying on your back to sitting on the side of a flat bed without using bedrails?: A Lot Help needed moving to and from a bed to a chair (including a wheelchair)?: A Lot Help needed standing up from a chair using your arms (e.g., wheelchair or bedside chair)?: A Lot Help needed to walk in hospital room?: A Lot Help needed climbing 3-5 steps with a railing? : A Lot 6 Click Score: 13    End of Session Equipment Utilized During Treatment: Oxygen Activity Tolerance: Patient limited by fatigue;Patient limited by pain Patient left: with call bell/phone within reach;in chair;with chair alarm set Nurse Communication: Mobility status PT Visit Diagnosis: Difficulty in walking, not elsewhere classified (R26.2);Muscle weakness (generalized) (M62.81)     Time: [id number] PT Time Calculation (min) (ACUTE ONLY): 38 min  Charges:  $Therapeutic Activity: 23-37 mins                    Yahel Fuston E, PT Acute Rehabilitation Services Pager [phone]  Office [phone]   Delva Derden D Elonda Husky 08/15/2019, 11:47 AM

## 2019-08-15 NOTE — Plan of Care (Signed)

## 2019-08-15 NOTE — Progress Notes (Addendum)
Inpatient Diabetes Program Recommendations  AACE/ADA: New Consensus Statement on Inpatient Glycemic Control (2015)  Target Ranges:  Prepandial:   less than 140 mg/dL      Peak postprandial:   less than 180 mg/dL (1-2 hours)      Critically ill patients:  140 - 180 mg/dL   Lab Results  Component Value Date   GLUCAP 168 (H) 08/15/2019   HGBA1C 9.4 (A) 07/18/2019    Review of Glycemic Control  Diabetes history: Type 2 DM Outpatient Diabetes medications: NPH 200 units QAM, Metformin 1000 mg BID, Byetta 0.04 mg BID Current orders for Inpatient glycemic control: IV insulin  Inpatient Diabetes Program Recommendations:    When ready for transition consider:  -Levemir 40 units two hours PRIOR to stopping IV insulin, then BID to follow - Novolog 0-9 units Q4H  Noted patient is followed by Dr Loanne Drilling with last appointment on 12/17. Will follow up with this patient when appropriate.   Spoke with patient at bedside and home regimen accurate. Discussed with pt better glucose control maybe optimized with having insulin coverage at night since NPH des not last 24 hours. Pt has great follow up with Endocrinologist and already has plans to speak with Dr. Loanne Drilling.   Glucose trends between 3.8-5.5 units /hour. Will continue to follow.  Thanks, Tama Headings RN, MSN, BC-ADM Inpatient Diabetes Coordinator Team Pager [phone] (8a-5p)

## 2019-08-15 NOTE — Progress Notes (Signed)
Hb 7.3, receive alert from endo tool, to notify provider if need to change  Treatment or keep endo tool. Continue monitor

## 2019-08-15 NOTE — Progress Notes (Signed)
Initial Nutrition Assessment  DOCUMENTATION CODES:   Morbid obesity  INTERVENTION:   -RD will follow for diet advancement and add supplements as appropriate  NUTRITION DIAGNOSIS:   Increased nutrient needs related to chronic illness(CHF, DM) as evidenced by estimated needs.  GOAL:   Patient will meet greater than or equal to 90% of their needs  MONITOR:   Diet advancement, Labs, Weight trends, Skin, I & O's  REASON FOR ASSESSMENT:   Consult Assessment of nutrition requirement/status  ASSESSMENT:   [name] is a 49 y.o. female with pmhx of Morbid obesity, Lupus, Sjogren's syndrome, CHF, DM c/b gastroparesis, CVA, HLD, OSA who presents with 2 days of severe nausea/vomiting 2/2 DKA  Pt admitted with DKA, AKI, and liver lesions.   Reviewed I/O's: +2.9 L x 24 hours and +6.9 L since admission  UOP: 1.5 L x 24 hours  Spoke with pt, who was sitting in recliner chair at time of visit. Pt was initially drowsy, but became more interactive/takative as interview progressed. She shares that she has been trying to make lifestyle changes since her brother passed away just prior to last Thanksgiving. She reports she has been trying to eat less bacon/processed meats and reducing salt intake. Pt consumes 3 meals per day; her sisters help with meal preparation (Breakfast: eggs, grits, and sausage; Lunch: [place] and bologna sandwich; Dinner: fried chicken (which she peels the skin off of) OR meat, starch, and vegetable made by her sister. Per pt, sister is very strict with pt's diet and bakes meat and uses Mrs Deliah Boston for seasonings. Pt reports consuming mostly water and gingerale (she uses ginerale to settle her stomach secondary to gastroparesis).   Pt admittedly has not weighed herself recently, due to frustration with lack of progress with weight loss. She estimates UBW is around 386#, which he last weighed about 4-5 months ago. He clothes have been fitting looser per her report. Per  discussion with RN< weight this AM was 188.6 kg.   Case discussed with RN and nurse tech, who reports CBGS reading continue to improve (CBG 150 when NT checked during visit). Readings have ranged between 150-186 this shift, with downward trend.   Per MD notes, plan transfer to [place] when ped is available for hopeful further interventions with liver lesions.   Discussed importance of compliance of CHF and DM self-management to help prevent further complications. Pt amenable to diet education at a later date when she ius feeling better. She is concerned about her current health and would like to continue to make lifestyle changes.  Medications reviewed and include prednisone, sodium bicarbonate in dextrose infusion @ 50 ml/hr and dextrose, and dextrose 5%-0.45% sodium chloride infusion @ 75 ml/hr.   Lab Results  Component Value Date   HGBA1C 9.4 (A) 07/18/2019   PTA DM medications are 0.4 ml byetta BID, 200 units insulin NPH q AM, and 1000 mg metformin BID. Pt reports that she has been followed by endocrinologist Dr. Loanne Drilling since being diagnosed with DM several years ago. Pt shares that control has been a constant struggle due to being on prednisone, but typically CBGS range between 130-185. She is motivated to improve glycemic control, as she wants to discontinue insulin.   Labs reviewed: CBGS: 148-168 (inpatient orders for glycemic control are IV insulin drip).   NUTRITION - FOCUSED PHYSICAL EXAM:    Most Recent Value  Orbital Region  No depletion  Upper Arm Region  No depletion  Thoracic and Lumbar Region  No depletion  Buccal Region  No depletion  Temple Region  No depletion  Clavicle Bone Region  No depletion  Clavicle and Acromion Bone Region  No depletion  Scapular Bone Region  No depletion  Dorsal Hand  No depletion  Patellar Region  No depletion  Anterior Thigh Region  No depletion  Posterior Calf Region  No depletion  Edema (RD Assessment)  Moderate  Hair  Reviewed  Eyes   Reviewed  Mouth  Reviewed  Skin  Reviewed  Nails  Reviewed       Diet Order:   Diet Order            Diet NPO time specified Except for: Sips with Meds  Diet effective now              EDUCATION NEEDS:   Education needs have been addressed  Skin:  Skin Assessment: Reviewed RN Assessment  Last BM:  08/12/19  Height:   Ht Readings from Last 1 Encounters:  08/13/19 5\' 10"  (1.778 m)    Weight:   Wt Readings from Last 1 Encounters:  08/15/19 (!) 194.7 kg    Ideal Body Weight:  72.7 kg  BMI:  Body mass index is 61.59 kg/m.  Estimated Nutritional Needs:   Kcal:  2200-2400  Protein:  130-145 grams  Fluid:  2 L    [name], RD, LDN, [place] Registered Dietitian II Certified Diabetes Care and Education Specialist Pager: [phone] After hours Pager: [phone]

## 2019-08-15 NOTE — Progress Notes (Signed)
PROGRESS NOTE    [name]  [id number] DOB: Oct 25, 1970 DOA: 08/12/2019 PCP: Bartholome Bill, MD   Brief Narrative: 49 y.o f w/ morbid obesity lupus, Sjogren's syndrome, CHF, diabetes mellitus with gastroparesis, history of CVA, HLD, OSA presented with 2 days of severe nausea vomiting and sudden onset of abdominal pain. In the ER found to have DKA,AKI, anemia.CT abd "bleeding into peritoneum by bleeding presumed adenoma". she has h/o hepatic mass with negative biopsy in 2014 and ablation and later resolution. Patient will start on gentle IV fluids due to CHF, had drop in pressure 70/30s and needed IV fluid bolus.  Seen by CCM, right IJ central line placed BP improved following fluid boluses. DAK/acidemia, slowly corrected on insulin gtt. GI was consulted who discussed with [facility] and has been accepted for transfer however currently no bed due to hospitalist being at capacity, GI. MRI  Ordered but patient unable to fit in MRI scanner.  Patient was noted to be Oliguric with rising serum Cr--foley placed and noted to have good urine output, nephrology was consulted.   Subjective: On bedside chair C/o soreness on back and RLQ like before- but some better. No fever,  Nausea+.  BP still stable, afebrile overnight T-max 98.9, T  Low 97.5. Creatinine this morning 3.14 from 4.4 LFTs abnormal, downtrending, WBC remains up at 14.5 previously 16-14.5 Eleven 7.3- downtrending uop 1500 ML.  Assessment & Plan:   Liver mass suspect multifocal hepatic adenomas with a small volume hemoperitoneum/history of hepatic adenomas treated with RFA in 2014-GI following closely.  Plavix on hold.  Awaiting for transfer to [facility].  MRI was ordered but unsuccessful as patient would not fit in the SCANNER  Abnormal LFTs/possible shock liver: LFTs  improving Recent Labs  Lab 08/12/19 1332 08/13/19 0606 08/13/19 0811 08/14/19 0412 08/15/19 0432  AST 260* 1,139*  --  537* 196*  ALT 160* 739*  --  487* 305*   ALKPHOS 76 84  --  93 106  BILITOT 0.4 0.8  --  0.8 1.0  PROT 6.6 7.0  --  6.2* 6.2*  ALBUMIN 3.1* 3.4*  --  3.0* 2.8*  INR  --   --  1.2  --  1.3*   Acute kidney injury: UOP 1500/24 hrs, creat improving. Appreciate nephrology input on board Recent Labs  Lab 08/13/19 0606 08/13/19 0900 08/13/19 1640 08/14/19 0820 08/15/19 0432  BUN 22* 25* 29* 37* 36*  CREATININE 2.82* 3.10* 3.68* 4.49* 3.14*   Acute blood loss anemia due to #1, HB 10.9 on 1/11.  Hemoglobin downtrending.  Patient is agreeable for blood transfusion if needed.  We will repeat H&H this afternoon and transfuse if less than 7 g Recent Labs  Lab 08/13/19 2020 08/14/19 0157 08/14/19 0412 08/14/19 0530 08/15/19 0432  HGB 7.8* 7.8* 7.9* 7.9* 7.3*   DKA type 2 diabetes mellitus on insulin: DKA has resolved, anion gap had closed but giiven NPO status  Due to #1- cont insulin drip, dextrose.  Diabetic coordinator on board advised to start levemir 40 U 2 hours prior to stopping IV insulin then twice daily to follow, NovoLog sliding scale every 4  Recent Labs  Lab 08/15/19 0059 08/15/19 0211 08/15/19 0322 08/15/19 0530 08/15/19 0750  GLUCAP 159* 179* 171* 156* 167*   Thrombocytopenia- plt at 127k, monitor  SLE/ SJOGREN'S SYNDROME, on Imuran, prednisone and Plaquenil  Group B strep in urine culture-likely asymptomatic bacteriuria.  UA was fairly stable 0-5 WBC on admission. Will repeat UA.  History  of CVA on Plavix which is on hold due to anemia and hemoperitoneum  HTN: w/ hypotension  OSA -uses 2l [place] at bedtime and CPAP at bedtime.  Systolic CHF, chronic: compensated.  Torsemide and Aldactone on hold. Watch closely.  Last echo on 06/05/2017 showed EF of 45 to 50%.  Watch for fluid overload, continue bicarb drip. nephro managing.  Chronic respiratory failure with hypoxia on 2l [place] at time as needed  Diabetic gastroparesis -npo now  Morbid obesity with BMI Body mass index is 61.59 kg/m.   DVT prophylaxis:  SCD Code Status: full Family Communication: plan of care discussed with patient at bedside. Lives w room, mate- has sister nearby. Disposition Plan: Remains inpatient for bleeding liver mass/uncontrolled diabetes.  Possible transfer to [facility] awaiting on bed.  PT OT as able, has advised home health PT supervision/assistance 24 hr.  Consultants: Dorene Grebe, PCCM. Procedures: NONE Microbiology: Urine culture more than 100,000 colonies of group B strep, COVID-19 negative influenza AB NEGATIVE.  Antimicrobials: Anti-infectives (From admission, onward)   Start     Dose/Rate Route Frequency Ordered Stop   08/13/19 0230  hydroxychloroquine (PLAQUENIL) tablet 200 mg     200 mg Oral 2 times daily 08/13/19 0208         Objective: Vitals:   08/15/19 0418 08/15/19 0419 08/15/19 0420 08/15/19 0427  BP:    129/76  Pulse: 99 99 100 99  Resp: 17 14 17 19   Temp:    (!) 97.5 F (36.4 C)  TempSrc:    Oral  SpO2: 100% 99% 99% 100%  Weight:    (!) 194.7 kg  Height:        Intake/Output Summary (Last 24 hours) at 08/15/2019 0800 Last data filed at 08/15/2019 0651 Gross per 24 hour  Intake 4391.62 ml  Output 1500 ml  Net 2891.62 ml   Filed Weights   08/13/19 0038 08/14/19 0419 08/15/19 0427  Weight: (!) 178.7 kg (!) 190.9 kg (!) 194.7 kg   Weight change: 3.8 kg  Body mass index is 61.59 kg/m.  Intake/Output from previous day: 01/13 0701 - 01/14 0700 In: 4391.6 [P.O.:490; I.V.:3901.6] Out: 1500 [Urine:1500] Intake/Output this shift: No intake/output data recorded.  Examination:  General exam: AAOx3, Obese,NAD, Weak appearing. HEENT:Oral mucosa moist, Ear/Nose WNL grossly, dentition normal. Respiratory system: Diminished BS b/l,no wheezing or crackles,no use of accessory muscle Cardiovascular system: S1 & S2 +, No JVD,. Gastrointestinal system: Abdomen soft, NT,ND, BS+ Nervous System:Alert, awake, moving extremities and grossly nonfocal Extremities: non pitting ?edema-chronic, distal  peripheral pulses palpable.  Skin: No rashes,no icterus. MSK: Normal muscle bulk,tone, power  Medications:  Scheduled Meds: . amLODipine  5 mg Oral Daily  . azaTHIOprine  100 mg Oral BID  . buPROPion  300 mg Oral Daily  . carvedilol  12.5 mg Oral BID WC  . Chlorhexidine Gluconate Cloth  6 each Topical Daily  . cycloSPORINE  1 drop Both Eyes BID  . DULoxetine  120 mg Oral QHS  . famotidine  20 mg Oral Daily  . hydroxychloroquine  200 mg Oral BID  . isosorbide-hydrALAZINE  2 tablet Oral TID  . loratadine  10 mg Oral Daily  . megestrol  40 mg Oral Daily  . mometasone-formoterol  2 puff Inhalation BID  . oxybutynin  15 mg Oral Daily  . predniSONE  3 mg Oral Daily  . pregabalin  75 mg Oral BID  . psyllium  1 packet Oral BID  . sodium chloride flush  10-40 mL Intracatheter Q12H  .  sucralfate  1 g Oral BID  . topiramate  75 mg Oral QHS   Continuous Infusions: . sodium chloride Stopped (08/13/19 1401)  . sodium chloride 4.4 mL/hr at 08/15/19 0544  . dextrose 5 % and 0.45% NaCl 75 mL/hr at 08/15/19 0651  . insulin 4.4 Units/hr (08/15/19 0631)  . sodium bicarbonate 150 mEq in dextrose 5% 1000 mL 50 mL/hr at 08/15/19 [id number]    Data Reviewed: I have personally reviewed following labs and imaging studies  CBC: Recent Labs  Lab 08/12/19 1332 08/12/19 1523 08/13/19 0606 08/13/19 0811 08/13/19 2020 08/14/19 0157 08/14/19 0412 08/14/19 0530 08/15/19 0432  WBC 12.5*  --  16.0*  --   --   --  14.4* 14.5* 14.5*  NEUTROABS  --   --   --   --   --   --   --  11.4*  --   HGB 10.9*   < > 9.9*   < > 7.8* 7.8* 7.9* 7.9* 7.3*  HCT 35.6*   < > 32.4*   < > 25.3* 25.2* 25.6* 25.8* 23.2*  MCV 92.7  --  93.1  --   --   --  93.4 93.1 92.1  PLT 271  --  203  --   --   --  138* 139* 127*   < > = values in this interval not displayed.   Basic Metabolic Panel: Recent Labs  Lab 08/12/19 1506 08/12/19 1523 08/13/19 0606 08/13/19 0900 08/13/19 1640 08/14/19 0820 08/15/19 0432  NA  --    < >  142 141 143 140 140  K  --    < > 4.5 4.3 4.4 4.4 4.0  CL  --    < > 109 110 113* 111 110  CO2  --    < > 18* 19* 19* 21* 22  GLUCOSE  --    < > 215* 209* 188* 184* 169*  BUN  --    < > 22* 25* 29* 37* 36*  CREATININE  --    < > 2.82* 3.10* 3.68* 4.49* 3.14*  CALCIUM  --    < > 9.0 8.7* 8.4* 8.2* 8.1*  MG 1.8  --   --   --   --   --   --    < > = values in this interval not displayed.   GFR: Estimated Creatinine Clearance: 41.2 mL/min (A) (by C-G formula based on SCr of 3.14 mg/dL (H)). Liver Function Tests: Recent Labs  Lab 08/12/19 1332 08/13/19 0606 08/14/19 0412 08/15/19 0432  AST 260* 1,139* 537* 196*  ALT 160* 739* 487* 305*  ALKPHOS 76 84 93 106  BILITOT 0.4 0.8 0.8 1.0  PROT 6.6 7.0 6.2* 6.2*  ALBUMIN 3.1* 3.4* 3.0* 2.8*   Recent Labs  Lab 08/12/19 1332  LIPASE 18   No results for input(s): AMMONIA in the last 168 hours. Coagulation Profile: Recent Labs  Lab 08/13/19 0811 08/15/19 0432  INR 1.2 1.3*   Cardiac Enzymes: No results for input(s): CKTOTAL, CKMB, CKMBINDEX, TROPONINI in the last 168 hours. BNP (last 3 results) No results for input(s): PROBNP in the last 8760 hours. HbA1C: No results for input(s): HGBA1C in the last 72 hours. CBG: Recent Labs  Lab 08/15/19 0059 08/15/19 0211 08/15/19 0322 08/15/19 0530 08/15/19 0750  GLUCAP 159* 179* 171* 156* 167*   Lipid Profile: No results for input(s): CHOL, HDL, LDLCALC, TRIG, CHOLHDL, LDLDIRECT in the last 72 hours. Thyroid Function Tests: No results for  input(s): TSH, T4TOTAL, FREET4, T3FREE, THYROIDAB in the last 72 hours. Anemia Panel: No results for input(s): VITAMINB12, FOLATE, FERRITIN, TIBC, IRON, RETICCTPCT in the last 72 hours. Sepsis Labs: No results for input(s): PROCALCITON, LATICACIDVEN in the last 168 hours.  Recent Results (from the past 240 hour(s))  Respiratory Panel by RT PCR (Flu A&B, Covid) - Nasopharyngeal Swab     Status: None   Collection Time: 08/12/19  3:06 PM    Specimen: Nasopharyngeal Swab  Result Value Ref Range Status   SARS Coronavirus 2 by RT PCR NEGATIVE NEGATIVE Final    Comment: (NOTE) SARS-CoV-2 target nucleic acids are NOT DETECTED. The SARS-CoV-2 RNA is generally detectable in upper respiratoy specimens during the acute phase of infection. The lowest concentration of SARS-CoV-2 viral copies this assay can detect is 131 copies/mL. A negative result does not preclude SARS-Cov-2 infection and should not be used as the sole basis for treatment or other patient management decisions. A negative result may occur with  improper specimen collection/handling, submission of specimen other than nasopharyngeal swab, presence of viral mutation(s) within the areas targeted by this assay, and inadequate number of viral copies (<131 copies/mL). A negative result must be combined with clinical observations, patient history, and epidemiological information. The expected result is Negative. Fact Sheet for Patients:  PinkCheek.be Fact Sheet for Healthcare Providers:  GravelBags.it This test is not yet ap proved or cleared by the [place] FDA and  has been authorized for detection and/or diagnosis of SARS-CoV-2 by FDA under an Emergency Use Authorization (EUA). This EUA will remain  in effect (meaning this test can be used) for the duration of the COVID-19 declaration under Section 564(b)(1) of the Act, 21 U.S.C. section 360bbb-3(b)(1), unless the authorization is terminated or revoked sooner.    Influenza A by PCR NEGATIVE NEGATIVE Final   Influenza B by PCR NEGATIVE NEGATIVE Final    Comment: (NOTE) The Xpert Xpress SARS-CoV-2/FLU/RSV assay is intended as an aid in  the diagnosis of influenza from Nasopharyngeal swab specimens and  should not be used as a sole basis for treatment. Nasal washings and  aspirates are unacceptable for Xpert Xpress SARS-CoV-2/FLU/RSV  testing. Fact Sheet  for Patients: PinkCheek.be Fact Sheet for Healthcare Providers: GravelBags.it This test is not yet approved or cleared by the [place] FDA and  has been authorized for detection and/or diagnosis of SARS-CoV-2 by  FDA under an Emergency Use Authorization (EUA). This EUA will remain  in effect (meaning this test can be used) for the duration of the  Covid-19 declaration under Section 564(b)(1) of the Act, 21  U.S.C. section 360bbb-3(b)(1), unless the authorization is  terminated or revoked. Performed at [facility], [place] [address] [place], [place] [address]   Urine culture     Status: Abnormal   Collection Time: 08/12/19 10:00 PM   Specimen: Urine, Clean Catch  Result Value Ref Range Status   Specimen Description URINE, CLEAN CATCH  Final   Special Requests NONE  Final   Culture (A)  Final    >=100,000 COLONIES/mL GROUP B STREP(S.AGALACTIAE)ISOLATED TESTING AGAINST S. AGALACTIAE NOT ROUTINELY PERFORMED DUE TO PREDICTABILITY OF AMP/PEN/VAN SUSCEPTIBILITY. Performed at [facility], [place] [address] [place], [place] [address]    Report Status 08/14/2019 FINAL  Final      Radiology Studies: DG CHEST PORT 1 VIEW  Result Date: 08/14/2019 CLINICAL DATA:  Shortness of breath EXAM: PORTABLE CHEST 1 VIEW COMPARISON:  Yesterday FINDINGS: Cardiomegaly and vascular pedicle widening.  Congested appearance of vessels which may have progressed. Right IJ line with tip at the SVC. No visible effusion or pneumothorax. IMPRESSION: Cardiomegaly with vascular congestion that may have progressed from yesterday. Electronically Signed   By: Monte Fantasia M.D.   On: 08/14/2019 05:17   DG Chest Portable 1 View  Result Date: 08/13/2019 CLINICAL DATA:  Post central line placement. EXAM: PORTABLE CHEST 1 VIEW COMPARISON:  Chest radiograph 08/12/2019 FINDINGS: Interval placement of a right IJ approach central venous catheter with tip  projecting in the region of the superior vena cava. Overlying cardiac monitoring leads. Unchanged mild cardiomegaly. Shallow inspiration radiograph. Persistent bibasilar opacities which may reflect atelectasis or pneumonia. No definite pleural effusion or evidence of pneumothorax. IMPRESSION: Interval placement of a right IJ approach central venous catheter with tip projecting in the region of the superior vena cava. Persistent low lung volumes and bibasilar opacities which may reflect atelectasis and/or pneumonia. Electronically Signed   By: Kellie Simmering DO   On: 08/13/2019 09:17      LOS: 3 days   Time spent: More than 50% of that time was spent in counseling and/or coordination of care.  Antonieta Pert, MD [facility]  08/15/2019, 8:00 AM

## 2019-08-16 DIAGNOSIS — K3184 Gastroparesis: Secondary | ICD-10-CM

## 2019-08-16 DIAGNOSIS — E1143 Type 2 diabetes mellitus with diabetic autonomic (poly)neuropathy: Secondary | ICD-10-CM

## 2019-08-16 LAB — RENAL FUNCTION PANEL
Albumin: 2.6 g/dL — ABNORMAL LOW (ref 3.5–5.0)
Anion gap: 7 (ref 5–15)
BUN: 28 mg/dL — ABNORMAL HIGH (ref 6–20)
CO2: 24 mmol/L (ref 22–32)
Calcium: 8.1 mg/dL — ABNORMAL LOW (ref 8.9–10.3)
Chloride: 107 mmol/L (ref 98–111)
Creatinine, Ser: 1.88 mg/dL — ABNORMAL HIGH (ref 0.44–1.00)
GFR calc Af Amer: 36 mL/min — ABNORMAL LOW (ref >60)
GFR calc non Af Amer: 31 mL/min — ABNORMAL LOW (ref >60)
Glucose, Bld: 156 mg/dL — ABNORMAL HIGH (ref 70–99)
Phosphorus: 3 mg/dL (ref 2.5–4.6)
Potassium: 3.7 mmol/L (ref 3.5–5.1)
Sodium: 138 mmol/L (ref 135–145)

## 2019-08-16 LAB — CBC
HCT: 22.2 % — ABNORMAL LOW (ref 36.0–46.0)
Hemoglobin: 6.9 g/dL — CL (ref 12.0–15.0)
MCH: 29 pg (ref 26.0–34.0)
MCHC: 31.1 g/dL (ref 30.0–36.0)
MCV: 93.3 fL (ref 80.0–100.0)
Platelets: 129 10*3/uL — ABNORMAL LOW (ref 150–400)
RBC: 2.38 MIL/uL — ABNORMAL LOW (ref 3.87–5.11)
RDW: 13.8 % (ref 11.5–15.5)
WBC: 12 10*3/uL — ABNORMAL HIGH (ref 4.0–10.5)
nRBC: 0.3 % — ABNORMAL HIGH (ref 0.0–0.2)

## 2019-08-16 LAB — GLUCOSE, CAPILLARY
Glucose-Capillary: 141 mg/dL — ABNORMAL HIGH (ref 70–99)
Glucose-Capillary: 141 mg/dL — ABNORMAL HIGH (ref 70–99)
Glucose-Capillary: 144 mg/dL — ABNORMAL HIGH (ref 70–99)
Glucose-Capillary: 144 mg/dL — ABNORMAL HIGH (ref 70–99)
Glucose-Capillary: 146 mg/dL — ABNORMAL HIGH (ref 70–99)
Glucose-Capillary: 148 mg/dL — ABNORMAL HIGH (ref 70–99)
Glucose-Capillary: 148 mg/dL — ABNORMAL HIGH (ref 70–99)
Glucose-Capillary: 150 mg/dL — ABNORMAL HIGH (ref 70–99)
Glucose-Capillary: 160 mg/dL — ABNORMAL HIGH (ref 70–99)
Glucose-Capillary: 165 mg/dL — ABNORMAL HIGH (ref 70–99)

## 2019-08-16 LAB — HEMOGLOBIN AND HEMATOCRIT, BLOOD
HCT: 23.2 % — ABNORMAL LOW (ref 36.0–46.0)
HCT: 24.5 % — ABNORMAL LOW (ref 36.0–46.0)
Hemoglobin: 7.3 g/dL — ABNORMAL LOW (ref 12.0–15.0)
Hemoglobin: 7.7 g/dL — ABNORMAL LOW (ref 12.0–15.0)

## 2019-08-16 LAB — HEMOGLOBIN A1C
Hgb A1c MFr Bld: 9 % — ABNORMAL HIGH (ref 4.8–5.6)
Mean Plasma Glucose: 211.6 mg/dL

## 2019-08-16 LAB — PREPARE RBC (CROSSMATCH)

## 2019-08-16 MED ORDER — INSULIN DETEMIR 100 UNIT/ML ~~LOC~~ SOLN
40.0000 [IU] | Freq: Every day | SUBCUTANEOUS | Status: DC
  Administered 2019-08-16 – 2019-08-18 (×3): 40 [IU] via SUBCUTANEOUS
  Filled 2019-08-16 (×4): qty 0.4

## 2019-08-16 MED ORDER — INSULIN ASPART 100 UNIT/ML ~~LOC~~ SOLN
0.0000 [IU] | Freq: Three times a day (TID) | SUBCUTANEOUS | Status: DC
  Administered 2019-08-16: 17:00:00 1 [IU] via SUBCUTANEOUS
  Administered 2019-08-17: 17:00:00 3 [IU] via SUBCUTANEOUS
  Administered 2019-08-17: 07:00:00 1 [IU] via SUBCUTANEOUS
  Administered 2019-08-17: 3 [IU] via SUBCUTANEOUS
  Administered 2019-08-18: 2 [IU] via SUBCUTANEOUS
  Administered 2019-08-18: 3 [IU] via SUBCUTANEOUS
  Administered 2019-08-18: 2 [IU] via SUBCUTANEOUS

## 2019-08-16 MED ORDER — FUROSEMIDE 10 MG/ML IJ SOLN
40.0000 mg | Freq: Once | INTRAMUSCULAR | Status: AC
  Administered 2019-08-16: 40 mg via INTRAVENOUS
  Filled 2019-08-16: qty 4

## 2019-08-16 MED ORDER — AZATHIOPRINE 50 MG PO TABS
50.0000 mg | ORAL_TABLET | Freq: Once | ORAL | Status: AC
  Administered 2019-08-16: 11:00:00 50 mg via ORAL
  Filled 2019-08-16: qty 1

## 2019-08-16 MED ORDER — AZATHIOPRINE 50 MG PO TABS
150.0000 mg | ORAL_TABLET | Freq: Two times a day (BID) | ORAL | Status: DC
  Administered 2019-08-16 – 2019-08-18 (×5): 150 mg via ORAL
  Filled 2019-08-16 (×5): qty 3

## 2019-08-16 MED ORDER — SODIUM CHLORIDE 0.9% IV SOLUTION: Freq: Once | INTRAVENOUS | Status: AC

## 2019-08-16 NOTE — Progress Notes (Signed)
Pt has had numerous CBG's that were in therapeutic range throughout the day and tonight.  Pt's anion gap is in normal range as well.  MD notified about the CBG's earlier, will continue to monitor, Thanks Arvella Nigh RN.

## 2019-08-16 NOTE — Plan of Care (Signed)
  Problem: Elimination: Goal: Will not experience complications related to urinary retention Outcome: Completed/Met

## 2019-08-16 NOTE — Progress Notes (Signed)
Subjective:  1700 of UOP recorded overnight- but 1400 so far today.  was  1.3 liters positive in terms of fluid balance. crt down again this AM-  hgb 7.4 to 6.9 last 24 hours.  She still reports nausea and belly pain- getting blood   Objective Vital signs in last 24 hours: Vitals:   08/16/19 1044 08/16/19 1045 08/16/19 1046 08/16/19 1047  BP:   127/73   Pulse: 94 94 95 94  Resp: 15 17 16 14   Temp:   98 F (36.7 C)   TempSrc:   Oral   SpO2: 99% 99% 94% 98%  Weight:      Height:       Weight change: -4.3 kg  Intake/Output Summary (Last 24 hours) at 08/16/2019 1113 Last data filed at 08/16/2019 1030 Gross per 24 hour  Intake 3150.4 ml  Output 3175 ml  Net -24.6 ml    Assessment/ Plan: Pt is a 49 y.o. yo female morbid obesity, SLE and Sjogren's, CHF, DM  who was admitted on 08/12/2019 with a bleeding liver mass-  Developed AKI in the hospital  Assessment/Plan: 1. Renal-  AKI (crt 1.28 on 1/11)  In the setting of hemodynamic instability/contrast and possibly some bladder out let obstruction (had really strong out put immediately after foley but imaging did not show hydo),  crt peaked at 4.5 - is down to 1.88 today and is nonoliguric.  Hope that kidney function will cont to improve as dialysis would not be possible given her size 2. HTN/vol-  Very difficult to determine volume status.  I suspect may be overloaded.  Would not give any more fluid boluses- will stop her maint IV.    She is on quite a bit of BP meds-  norvasc 5/bidil 2 tabs TID and coreg.  Be careful about BP drop again.  I am going to stop her norvasc.  Given one dose of lasix today with blood.  OK to give lasix PRN 3. Anemia- ABLA-  Bleeding into liver mass.  Per primary and GI-  Transfusions as needed  4.  Bleeding liver mass- unknown etiology. Possible transfer to [place] in the works-  Transfusion dep at this point 5.  Metabolic acidosis-  Improved-  Will stop iv bicarb-  Anticipate will cont to improve as renal function  improves   As AKI has resolved renal will sign off-  Call with questions    Louis Meckel    Labs: Basic Metabolic Panel: Recent Labs  Lab 08/14/19 0820 08/15/19 0432 08/16/19 0212  NA 140 140 138  K 4.4 4.0 3.7  CL 111 110 107  CO2 21* 22 24  GLUCOSE 184* 169* 156*  BUN 37* 36* 28*  CREATININE 4.49* 3.14* 1.88*  CALCIUM 8.2* 8.1* 8.1*  PHOS  --   --  3.0   Liver Function Tests: Recent Labs  Lab 08/13/19 0606 08/13/19 0606 08/14/19 0412 08/15/19 0432 08/16/19 0212  AST 1,139*  --  537* 196*  --   ALT 739*  --  487* 305*  --   ALKPHOS 84  --  93 106  --   BILITOT 0.8  --  0.8 1.0  --   PROT 7.0  --  6.2* 6.2*  --   ALBUMIN 3.4*   < > 3.0* 2.8* 2.6*   < > = values in this interval not displayed.   Recent Labs  Lab 08/12/19 1332  LIPASE 18   No results for input(s): AMMONIA in the last  168 hours. CBC: Recent Labs  Lab 08/13/19 0606 08/13/19 0811 08/14/19 0412 08/14/19 0412 08/14/19 0530 08/14/19 0530 08/15/19 0432 08/15/19 0432 08/15/19 1536 08/15/19 2202 08/16/19 0620  WBC 16.0*   < > 14.4*   < > 14.5*  --  14.5*  --   --   --  12.0*  NEUTROABS  --   --   --   --  11.4*  --   --   --   --   --   --   HGB 9.9*   < > 7.9*   < > 7.9*   < > 7.3*   < > 7.4* 7.9* 6.9*  HCT 32.4*   < > 25.6*   < > 25.8*   < > 23.2*   < > 23.7* 25.2* 22.2*  MCV 93.1  --  93.4  --  93.1  --  92.1  --   --   --  93.3  PLT 203   < > 138*   < > 139*  --  127*  --   --   --  129*   < > = values in this interval not displayed.   Cardiac Enzymes: No results for input(s): CKTOTAL, CKMB, CKMBINDEX, TROPONINI in the last 168 hours. CBG: Recent Labs  Lab 08/15/19 2301 08/16/19 0007 08/16/19 0204 08/16/19 0406 08/16/19 0754  GLUCAP 156* 160* 141* 144* 146*    Iron Studies: No results for input(s): IRON, TIBC, TRANSFERRIN, FERRITIN in the last 72 hours. Studies/Results: No results found. Medications: Infusions: . dextrose 5 % and 0.45% NaCl 75 mL/hr at 08/16/19  0043  . insulin 3.6 Units/hr (08/16/19 1015)  . sodium bicarbonate 150 mEq in dextrose 5% 1000 mL 150 mEq (08/16/19 0349)    Scheduled Medications: . amLODipine  5 mg Oral Daily  . azaTHIOprine  150 mg Oral BID  . buPROPion  300 mg Oral Daily  . carvedilol  12.5 mg Oral BID WC  . Chlorhexidine Gluconate Cloth  6 each Topical Daily  . cycloSPORINE  1 drop Both Eyes BID  . DULoxetine  120 mg Oral QHS  . famotidine  20 mg Oral Daily  . hydroxychloroquine  200 mg Oral BID  . isosorbide-hydrALAZINE  2 tablet Oral TID  . loratadine  10 mg Oral Daily  . megestrol  40 mg Oral Daily  . mometasone-formoterol  2 puff Inhalation BID  . oxybutynin  15 mg Oral Daily  . predniSONE  3 mg Oral Daily  . pregabalin  75 mg Oral BID  . psyllium  1 packet Oral BID  . sodium chloride flush  10-40 mL Intracatheter Q12H  . sucralfate  1 g Oral BID  . topiramate  75 mg Oral QHS    have reviewed scheduled and prn medications.  Physical Exam: General: alert, c/o nausea and abd pain Heart: tachy Lungs: diff exam- on 3 L [place] Abdomen: obese, diffusely tender Extremities: pitting edema to dep areas    08/16/2019,11:13 AM  LOS: 4 days

## 2019-08-16 NOTE — Progress Notes (Signed)
[facility]  PROGRESS NOTE  [name] [id number] DOB: [birth year] DOA: 08/12/2019 PCP: Bartholome Bill, MD   Brief HPI:   49 year old female with history of mood obesity, lupus, Sjogren's syndrome, CHF, diabetes mellitus type 2 with gastroparesis, history of CVA, hyperlipidemia, OSA came with 2-day history of nausea vomiting and sudden onset abdominal pain.  In the ED she was found to have DKA, AKI, anemia.  CT abdomen pelvis showed bleeding into the peritoneum by bleeding presumed adenoma.  She has a history of hepatic mass with negative biopsy in 2014 with ablation in late resolution.  She was started on gentle IV hydration due to CHF and drop in blood pressure, required IV fluid boluses.  She was seen by CCM, right IJ central line placed.  BP improved following fluid boluses.  DKA/acidemia slowly corrected on insulin GTT.  GI was consulted to discuss with [facility] and has been excepted for transfer currently there are no beds.  MRI was ordered but patient was unable to fit in the MRI scanner.  She was noted to be oliguric with serum creatinine rising, Foley catheter placed noted to have good urine output.  Nephrology was consulted.    Subjective   Patient seen and examined, denies abdominal pain or chest pain.     Assessment/Plan:     1. Liver masses-suspect multifocal hepatic adenomas with small volume hemoperitoneum.  She has history of hepatic adenomas treated with RFA in 2014.  GI is following.  Plavix is currently on hold.  Awaiting transfer to [facility], [place].  MRI was ordered but unsuccessful as patient would not fit in the scanner.  2. Abnormal LFTs/possible shock liver-LFTs are slowly improving.  3. Acute blood loss anemia due to #1-hemoglobin was 10.9 on 08/12/2019, today dropped to 6.9.  Will transfuse 1 unit PRBC.  Lasix 40 mg IV x1 will be given.  4. Acute kidney injury-creatinine has already improved to 1.88, nephrology is following.  Started on IV Lasix with blood  transfusion after discussion with nephrology.  5. DKA type 2 diabetes mellitus-patient was started on IV insulin, anion gap closed, bicarb improved.  Still she was getting IV insulin with D5 as she was n.p.o.  At this time we will switch her from IV insulin to Levemir 40 units subcu daily, sliding scale insulin with NovoLog.  Start her on carb modified diet.  6. Thrombocytopenia-platelet count is 1 27,000.  Continue to monitor.  7. Sjogren's syndrome-patient is on Imuran, prednisone, Plaquenil.  8. Group B strep in urine culture-likely asymptomatic bacteriuria.  UA was stable at the time of admission.  Repeat UA on 08/15/2019 is unremarkable.  9. Chronic respiratory failure with hypoxia-patient is on 2 L/min nasal cannula as needed.  10. Diabetic gastroparesis-started on a modified diet.  11. Systolic CHF, chronic-compensated.  Torsemide and Aldactone are currently on hold.  Last echo showed EF of 45 to 50%.  Patient is fluid overload.  Fluid management as per nephrology.    SpO2: 99 % O2 Flow Rate (L/min): 3 L/min FiO2 (%): 32 %    Lab Results  Component Value Date   SARSCOV2NAA NEGATIVE 08/12/2019     CBG: Recent Labs  Lab 08/16/19 0613 08/16/19 0754 08/16/19 1009 08/16/19 1205 08/16/19 1400  GLUCAP 141* 146* 148* 148* 144*    CBC: Recent Labs  Lab 08/13/19 0606 08/13/19 [id number] 08/14/19 0412 08/14/19 0412 08/14/19 0530 08/15/19 0432 08/15/19 1536 08/15/19 2202 08/16/19 0620  WBC 16.0*  --  14.4*  --  14.5*  14.5*  --   --  12.0*  NEUTROABS  --   --   --   --  11.4*  --   --   --   --   HGB 9.9*   < > 7.9*   < > 7.9* 7.3* 7.4* 7.9* 6.9*  HCT 32.4*   < > 25.6*   < > 25.8* 23.2* 23.7* 25.2* 22.2*  MCV 93.1  --  93.4  --  93.1 92.1  --   --  93.3  PLT 203  --  138*  --  139* 127*  --   --  129*   < > = values in this interval not displayed.    Basic Metabolic Panel: Recent Labs  Lab 08/12/19 1506 08/12/19 1523 08/13/19 0900 08/13/19 1640 08/14/19 0820  08/15/19 0432 08/16/19 0212  NA  --    < > 141 143 140 140 138  K  --    < > 4.3 4.4 4.4 4.0 3.7  CL  --    < > 110 113* 111 110 107  CO2  --    < > 19* 19* 21* 22 24  GLUCOSE  --    < > 209* 188* 184* 169* 156*  BUN  --    < > 25* 29* 37* 36* 28*  CREATININE  --    < > 3.10* 3.68* 4.49* 3.14* 1.88*  CALCIUM  --    < > 8.7* 8.4* 8.2* 8.1* 8.1*  MG 1.8  --   --   --   --   --   --   PHOS  --   --   --   --   --   --  3.0   < > = values in this interval not displayed.     Liver Function Tests: Recent Labs  Lab 08/12/19 1332 08/13/19 0606 08/14/19 0412 08/15/19 0432 08/16/19 0212  AST 260* 1,139* 537* 196*  --   ALT 160* 739* 487* 305*  --   ALKPHOS 76 84 93 106  --   BILITOT 0.4 0.8 0.8 1.0  --   PROT 6.6 7.0 6.2* 6.2*  --   ALBUMIN 3.1* 3.4* 3.0* 2.8* 2.6*        DVT prophylaxis: SCDs  Code Status: Full code  Family Communication: No family at bedside  Disposition Plan: Awaiting transfer to [facility], pending bed availability.         Scheduled medications:  . azaTHIOprine  150 mg Oral BID  . buPROPion  300 mg Oral Daily  . carvedilol  12.5 mg Oral BID WC  . Chlorhexidine Gluconate Cloth  6 each Topical Daily  . cycloSPORINE  1 drop Both Eyes BID  . DULoxetine  120 mg Oral QHS  . famotidine  20 mg Oral Daily  . hydroxychloroquine  200 mg Oral BID  . insulin aspart  0-9 Units Subcutaneous TID WC  . insulin detemir  40 Units Subcutaneous Daily  . isosorbide-hydrALAZINE  2 tablet Oral TID  . loratadine  10 mg Oral Daily  . megestrol  40 mg Oral Daily  . mometasone-formoterol  2 puff Inhalation BID  . oxybutynin  15 mg Oral Daily  . predniSONE  3 mg Oral Daily  . pregabalin  75 mg Oral BID  . psyllium  1 packet Oral BID  . sodium chloride flush  10-40 mL Intracatheter Q12H  . sucralfate  1 g Oral BID  . topiramate  75 mg Oral QHS  Consultants:  GI  Nephrology  PCCM  Procedures:  Antibiotics:   Anti-infectives (From admission, onward)    Start     Dose/Rate Route Frequency Ordered Stop   08/13/19 0230  hydroxychloroquine (PLAQUENIL) tablet 200 mg     200 mg Oral 2 times daily 08/13/19 0208         Objective   Vitals:   08/16/19 1047 08/16/19 1215 08/16/19 1230 08/16/19 1245  BP:    129/73  Pulse: 94 90 89 94  Resp: 14 15 12 14   Temp:    98.6 F (37 C)  TempSrc:    Oral  SpO2: 98% 99% 100% 99%  Weight:      Height:        Intake/Output Summary (Last 24 hours) at 08/16/2019 1410 Last data filed at 08/16/2019 1245 Gross per 24 hour  Intake 2603.34 ml  Output 2625 ml  Net -21.66 ml    01/13 1901 - 01/15 0700 In: 7059 [P.O.:810; I.V.:6249] Out: 2375 [Urine:2375]  Filed Weights   08/14/19 0419 08/15/19 0427 08/16/19 0424  Weight: (!) 190.9 kg (!) 194.7 kg (!) 190.4 kg    Physical Examination:   General-appears in no acute distress Heart-S1-S2, regular, no murmur auscultated Lungs-clear to auscultation bilaterally, no wheezing or crackles auscultated Abdomen-soft, nontender, no organomegaly Extremities-no edema in the lower extremities Neuro-alert, oriented x3, no focal deficit noted   Data Reviewed:   Recent Results (from the past 240 hour(s))  Respiratory Panel by RT PCR (Flu A&B, Covid) - Nasopharyngeal Swab     Status: None   Collection Time: 08/12/19  3:06 PM   Specimen: Nasopharyngeal Swab  Result Value Ref Range Status   SARS Coronavirus 2 by RT PCR NEGATIVE NEGATIVE Final    Comment: (NOTE) SARS-CoV-2 target nucleic acids are NOT DETECTED. The SARS-CoV-2 RNA is generally detectable in upper respiratoy specimens during the acute phase of infection. The lowest concentration of SARS-CoV-2 viral copies this assay can detect is 131 copies/mL. A negative result does not preclude SARS-Cov-2 infection and should not be used as the sole basis for treatment or other patient management decisions. A negative result may occur with  improper specimen collection/handling, submission of specimen  other than nasopharyngeal swab, presence of viral mutation(s) within the areas targeted by this assay, and inadequate number of viral copies (<131 copies/mL). A negative result must be combined with clinical observations, patient history, and epidemiological information. The expected result is Negative. Fact Sheet for Patients:  PinkCheek.be Fact Sheet for Healthcare Providers:  GravelBags.it This test is not yet ap proved or cleared by the [place] FDA and  has been authorized for detection and/or diagnosis of SARS-CoV-2 by FDA under an Emergency Use Authorization (EUA). This EUA will remain  in effect (meaning this test can be used) for the duration of the COVID-19 declaration under Section 564(b)(1) of the Act, 21 U.S.C. section 360bbb-3(b)(1), unless the authorization is terminated or revoked sooner.    Influenza A by PCR NEGATIVE NEGATIVE Final   Influenza B by PCR NEGATIVE NEGATIVE Final    Comment: (NOTE) The Xpert Xpress SARS-CoV-2/FLU/RSV assay is intended as an aid in  the diagnosis of influenza from Nasopharyngeal swab specimens and  should not be used as a sole basis for treatment. Nasal washings and  aspirates are unacceptable for Xpert Xpress SARS-CoV-2/FLU/RSV  testing. Fact Sheet for Patients: PinkCheek.be Fact Sheet for Healthcare Providers: GravelBags.it This test is not yet approved or cleared by the [place] and  has been authorized for detection and/or diagnosis of SARS-CoV-2 by  FDA under an Emergency Use Authorization (EUA). This EUA will remain  in effect (meaning this test can be used) for the duration of the  Covid-19 declaration under Section 564(b)(1) of the Act, 21  U.S.C. section 360bbb-3(b)(1), unless the authorization is  terminated or revoked. Performed at [facility], [place] [address] [place], [place] [address]    Urine culture     Status: Abnormal   Collection Time: 08/12/19 10:00 PM   Specimen: Urine, Clean Catch  Result Value Ref Range Status   Specimen Description URINE, CLEAN CATCH  Final   Special Requests NONE  Final   Culture (A)  Final    >=100,000 COLONIES/mL GROUP B STREP(S.AGALACTIAE)ISOLATED TESTING AGAINST S. AGALACTIAE NOT ROUTINELY PERFORMED DUE TO PREDICTABILITY OF AMP/PEN/VAN SUSCEPTIBILITY. Performed at [facility], [place] [address], [place], [place] [address]    Report Status 08/14/2019 FINAL  Final    Recent Labs  Lab 08/12/19 1332  LIPASE 18   No results for input(s): AMMONIA in the last 168 hours.  Cardiac Enzymes: No results for input(s): CKTOTAL, CKMB, CKMBINDEX, TROPONINI in the last 168 hours. BNP (last 3 results) Recent Labs    08/12/19 1506  BNP 91.4      Admission status: Inpatient: Based on patients clinical presentation and evaluation of above clinical data, I have made determination that patient meets Inpatient criteria at this time.   Oswald Hillock   [facility] If 7PM-7AM, please contact night-coverage at [email or web address], Office  [phone]  password [email or web address]  08/16/2019, 2:10 PM  LOS: 4 days

## 2019-08-16 NOTE — Progress Notes (Signed)
RN from [facility] called to check on the status of [name], she stated there were no beds available yet, but someone from [facility] would contact us when a bed was available, will continue to monitor, Thanks Arvella Nigh RN.

## 2019-08-16 NOTE — Progress Notes (Addendum)
Inpatient Diabetes Program Recommendations  AACE/ADA: New Consensus Statement on Inpatient Glycemic Control (2015)  Target Ranges:  Prepandial:   less than 140 mg/dL      Peak postprandial:   less than 180 mg/dL (1-2 hours)      Critically ill patients:  140 - 180 mg/dL   Lab Results  Component Value Date   GLUCAP 146 (H) 08/16/2019   HGBA1C 9.4 (A) 07/18/2019    Review of Glycemic Control  Diabetes history: Type 2 DM Outpatient Diabetes medications: NPH 200 units QAM, Metformin 1000 mg BID, Byetta 0.04 mg BID Current orders for Inpatient glycemic control: IV insulin  Inpatient Diabetes Program Recommendations:    Patient may benefit from staying on IV insulin for transfer for optimal glucose control especially if she is facing any type of procedures in the near future.  If the desire is to transition/ at time of transition consider  -Levemir 50 units two hours PRIOR to stopping IV insulin, then BID to follow - Novolog 0-9 units Q4H  Pt will follow back up with Dr Loanne Drilling, Endocrinology at time of d/c when stable (last appointment on 12/17).   Glucose trends between 3.8-6.5 units /hour. Will continue to follow.  Thanks, Tama Headings RN, MSN, BC-ADM Inpatient Diabetes Coordinator Team Pager [phone] (8a-5p)

## 2019-08-16 NOTE — Progress Notes (Signed)
OT Cancellation Note  Patient Details Name: [name] MRN: [id number] DOB: [birth year]   Cancelled Treatment:    Reason Eval/Treat Not Completed: Pain limiting ability to participate;Fatigue/lethargy limiting ability to participate;Other (comment) Pt getting blood upon OT arrival, reporting not feeling well declining OT session. Will check back as time allows.  Lanier Clam., COTA/L Acute Rehabilitation Services [phone]   Ihor Gully 08/16/2019, 1:20 PM

## 2019-08-17 LAB — TYPE AND SCREEN
ABO/RH(D): O POS
Antibody Screen: NEGATIVE
Unit division: 0

## 2019-08-17 LAB — CBC
HCT: 23.8 % — ABNORMAL LOW (ref 36.0–46.0)
Hemoglobin: 7.3 g/dL — ABNORMAL LOW (ref 12.0–15.0)
MCH: 28.2 pg (ref 26.0–34.0)
MCHC: 30.7 g/dL (ref 30.0–36.0)
MCV: 91.9 fL (ref 80.0–100.0)
Platelets: 157 10*3/uL (ref 150–400)
RBC: 2.59 MIL/uL — ABNORMAL LOW (ref 3.87–5.11)
RDW: 13.7 % (ref 11.5–15.5)
WBC: 11.7 10*3/uL — ABNORMAL HIGH (ref 4.0–10.5)
nRBC: 0.3 % — ABNORMAL HIGH (ref 0.0–0.2)

## 2019-08-17 LAB — RENAL FUNCTION PANEL
Albumin: 2.3 g/dL — ABNORMAL LOW (ref 3.5–5.0)
Anion gap: 7 (ref 5–15)
BUN: 16 mg/dL (ref 6–20)
CO2: 27 mmol/L (ref 22–32)
Calcium: 8.4 mg/dL — ABNORMAL LOW (ref 8.9–10.3)
Chloride: 104 mmol/L (ref 98–111)
Creatinine, Ser: 1.31 mg/dL — ABNORMAL HIGH (ref 0.44–1.00)
GFR calc Af Amer: 56 mL/min — ABNORMAL LOW (ref >60)
GFR calc non Af Amer: 48 mL/min — ABNORMAL LOW (ref >60)
Glucose, Bld: 145 mg/dL — ABNORMAL HIGH (ref 70–99)
Phosphorus: 2.8 mg/dL (ref 2.5–4.6)
Potassium: 3.4 mmol/L — ABNORMAL LOW (ref 3.5–5.1)
Sodium: 138 mmol/L (ref 135–145)

## 2019-08-17 LAB — BPAM RBC
Blood Product Expiration Date: 202102112359
ISSUE DATE / TIME: 202101151022
Unit Type and Rh: 5100

## 2019-08-17 LAB — GLUCOSE, CAPILLARY
Glucose-Capillary: 143 mg/dL — ABNORMAL HIGH (ref 70–99)
Glucose-Capillary: 223 mg/dL — ABNORMAL HIGH (ref 70–99)
Glucose-Capillary: 209 mg/dL — ABNORMAL HIGH (ref 70–99)
Glucose-Capillary: 213 mg/dL — ABNORMAL HIGH (ref 70–99)

## 2019-08-17 MED ORDER — POTASSIUM CHLORIDE CRYS ER 20 MEQ PO TBCR
20.0000 meq | EXTENDED_RELEASE_TABLET | Freq: Once | ORAL | Status: AC
  Administered 2019-08-17: 20 meq via ORAL
  Filled 2019-08-17: qty 1

## 2019-08-17 NOTE — Progress Notes (Signed)
RT placed patient on CPAP with 4L O2 bled into circuit. Patient tolerating well at this time. RN is aware. RT will monitor as needed.

## 2019-08-17 NOTE — Progress Notes (Signed)
[facility]  PROGRESS NOTE  [name] [id number] DOB: [birth year] DOA: 08/12/2019 PCP: Bartholome Bill, MD   Brief HPI:   49 year old female with history of mood obesity, lupus, Sjogren's syndrome, CHF, diabetes mellitus type 2 with gastroparesis, history of CVA, hyperlipidemia, OSA came with 2-day history of nausea vomiting and sudden onset abdominal pain.  In the ED she was found to have DKA, AKI, anemia.  CT abdomen pelvis showed bleeding into the peritoneum by bleeding presumed adenoma.  She has a history of hepatic mass with negative biopsy in 2014 with ablation in late resolution.  She was started on gentle IV hydration due to CHF and drop in blood pressure, required IV fluid boluses.  She was seen by CCM, right IJ central line placed.  BP improved following fluid boluses.  DKA/acidemia slowly corrected on insulin GTT.  GI was consulted to discuss with [facility] and has been excepted for transfer currently there are no beds.  MRI was ordered but patient was unable to fit in the MRI scanner.  She was noted to be oliguric with serum creatinine rising, Foley catheter placed noted to have good urine output.  Nephrology was consulted.   Subjective  Patient seen and examined   Assessment/Plan:     1. Liver masses-suspect multifocal hepatic adenomas with small volume hemoperitoneum.  She has history of hepatic adenomas treated with RFA in 2014.  GI is following.  Plavix is currently on hold.  Awaiting transfer to [facility].  MRI was ordered but unsuccessful as patient would not fit in the scanner.  2. Abnormal LFTs/possible shock liver-LFTs are slowly improving.  3. Acute blood loss anemia due to #1-hemoglobin was 10.9 on 08/12/2019, hemoglobin dropped to 6.9 yesterday.  S/p 1 unit PRBC.  Today hemoglobin is 7.3.  4. Acute kidney injury-creatinine has already improved to 1. 31, nephrology is following.   5. DKA type 2 diabetes mellitus-patient was started on IV insulin, anion gap  closed, bicarb improved.  Still she was getting IV insulin with D5 as she was n.p.o. she was switched from IV insulin to Levemir 40 units subcutaneously daily.  Blood glucose is well controlled.  Started on carb modified diet.   6. Thrombocytopenia-platelet count is 1, 27000.  Continue to monitor.  7. Sjogren's syndrome-patient is on Imuran, prednisone, Plaquenil.  8. Group B strep in urine culture-likely asymptomatic bacteriuria.  UA was stable at the time of admission.  Repeat UA on 08/15/2019 is unremarkable.  9. Chronic respiratory failure with hypoxia-patient is on 2 L/min nasal cannula as needed.  10. Diabetic gastroparesis-started on a modified diet.  11. Systolic CHF, chronic-compensated.  Torsemide and Aldactone are currently on hold due to worsening renal function..  Last echo showed EF of 45 to 50%.  As per nephrology, give Lasix as needed.    SpO2: 100 % O2 Flow Rate (L/min): 3 L/min FiO2 (%): 32 %    Lab Results  Component Value Date   SARSCOV2NAA NEGATIVE 08/12/2019     CBG: Recent Labs  Lab 08/16/19 1205 08/16/19 1400 08/16/19 1614 08/16/19 2055 08/17/19 0610  GLUCAP 148* 144* 150* 165* 143*    CBC: Recent Labs  Lab 08/14/19 0412 08/14/19 0412 08/14/19 0530 08/14/19 0530 08/15/19 0432 08/15/19 1536 08/15/19 2202 08/16/19 0620 08/16/19 1455 08/16/19 2230 08/17/19 0545  WBC 14.4*  --  14.5*  --  14.5*  --   --  12.0*  --   --  11.7*  NEUTROABS  --   --  11.4*  --   --   --   --   --   --   --   --   HGB 7.9*   < > 7.9*   < > 7.3*   < > 7.9* 6.9* 7.3* 7.7* 7.3*  HCT 25.6*   < > 25.8*   < > 23.2*   < > 25.2* 22.2* 23.2* 24.5* 23.8*  MCV 93.4  --  93.1  --  92.1  --   --  93.3  --   --  91.9  PLT 138*  --  139*  --  127*  --   --  129*  --   --  157   < > = values in this interval not displayed.    Basic Metabolic Panel: Recent Labs  Lab 08/12/19 1506 08/12/19 1523 08/13/19 1640 08/14/19 0820 08/15/19 0432 08/16/19 0212 08/17/19 0545  NA   --    < > 143 140 140 138 138  K  --    < > 4.4 4.4 4.0 3.7 3.4*  CL  --    < > 113* 111 110 107 104  CO2  --    < > 19* 21* 22 24 27   GLUCOSE  --    < > 188* 184* 169* 156* 145*  BUN  --    < > 29* 37* 36* 28* 16  CREATININE  --    < > 3.68* 4.49* 3.14* 1.88* 1.31*  CALCIUM  --    < > 8.4* 8.2* 8.1* 8.1* 8.4*  MG 1.8  --   --   --   --   --   --   PHOS  --   --   --   --   --  3.0 2.8   < > = values in this interval not displayed.     Liver Function Tests: Recent Labs  Lab 08/12/19 1332 08/12/19 1332 08/13/19 0606 08/14/19 0412 08/15/19 0432 08/16/19 0212 08/17/19 0545  AST 260*  --  1,139* 537* 196*  --   --   ALT 160*  --  739* 487* 305*  --   --   ALKPHOS 76  --  84 93 106  --   --   BILITOT 0.4  --  0.8 0.8 1.0  --   --   PROT 6.6  --  7.0 6.2* 6.2*  --   --   ALBUMIN 3.1*   < > 3.4* 3.0* 2.8* 2.6* 2.3*   < > = values in this interval not displayed.        DVT prophylaxis: SCDs  Code Status: Full code  Family Communication: No family at bedside  Disposition Plan: Awaiting transfer to [facility], pending bed availability.       Scheduled medications:  . azaTHIOprine  150 mg Oral BID  . buPROPion  300 mg Oral Daily  . carvedilol  12.5 mg Oral BID WC  . Chlorhexidine Gluconate Cloth  6 each Topical Daily  . cycloSPORINE  1 drop Both Eyes BID  . DULoxetine  120 mg Oral QHS  . famotidine  20 mg Oral Daily  . hydroxychloroquine  200 mg Oral BID  . insulin aspart  0-9 Units Subcutaneous TID WC  . insulin detemir  40 Units Subcutaneous Daily  . isosorbide-hydrALAZINE  2 tablet Oral TID  . loratadine  10 mg Oral Daily  . megestrol  40 mg Oral Daily  . mometasone-formoterol  2 puff Inhalation BID  .  oxybutynin  15 mg Oral Daily  . predniSONE  3 mg Oral Daily  . pregabalin  75 mg Oral BID  . psyllium  1 packet Oral BID  . sodium chloride flush  10-40 mL Intracatheter Q12H  . sucralfate  1 g Oral BID  . topiramate  75 mg Oral QHS     Consultants:  GI  Nephrology  PCCM  Procedures:  Antibiotics:   Anti-infectives (From admission, onward)   Start     Dose/Rate Route Frequency Ordered Stop   08/13/19 0230  hydroxychloroquine (PLAQUENIL) tablet 200 mg     200 mg Oral 2 times daily 08/13/19 0208         Objective   Vitals:   08/17/19 0700 08/17/19 0737 08/17/19 0800 08/17/19 1100  BP: 119/63 112/64 112/64   Pulse: 91 96 97   Resp: 15 18 19    Temp:  99.6 F (37.6 C)  98.5 F (36.9 C)  TempSrc:  Oral    SpO2: 100%  100%   Weight:      Height:        Intake/Output Summary (Last 24 hours) at 08/17/2019 1145 Last data filed at 08/17/2019 0900 Gross per 24 hour  Intake 1292 ml  Output 2750 ml  Net -1458 ml    01/14 1901 - 01/16 0700 In: 2986.5 [P.O.:1322; I.V.:1349.5] Out: 4775 [Urine:4775]  Filed Weights   08/15/19 0427 08/16/19 0424 08/17/19 0449  Weight: (!) 194.7 kg (!) 190.4 kg (!) 187.8 kg    Physical Examination:   General-appears in no acute distress Heart-S1-S2, regular, no murmur auscultated Lungs-clear to auscultation bilaterally, no wheezing or crackles auscultated Abdomen-soft, nontender, no organomegaly Extremities-no edema in the lower extremities Neuro-alert, oriented x3, no focal deficit noted   Data Reviewed:   Recent Results (from the past 240 hour(s))  Respiratory Panel by RT PCR (Flu A&B, Covid) - Nasopharyngeal Swab     Status: None   Collection Time: 08/12/19  3:06 PM   Specimen: Nasopharyngeal Swab  Result Value Ref Range Status   SARS Coronavirus 2 by RT PCR NEGATIVE NEGATIVE Final    Comment: (NOTE) SARS-CoV-2 target nucleic acids are NOT DETECTED. The SARS-CoV-2 RNA is generally detectable in upper respiratoy specimens during the acute phase of infection. The lowest concentration of SARS-CoV-2 viral copies this assay can detect is 131 copies/mL. A negative result does not preclude SARS-Cov-2 infection and should not be used as the sole basis for  treatment or other patient management decisions. A negative result may occur with  improper specimen collection/handling, submission of specimen other than nasopharyngeal swab, presence of viral mutation(s) within the areas targeted by this assay, and inadequate number of viral copies (<131 copies/mL). A negative result must be combined with clinical observations, patient history, and epidemiological information. The expected result is Negative. Fact Sheet for Patients:  PinkCheek.be Fact Sheet for Healthcare Providers:  GravelBags.it This test is not yet ap proved or cleared by the [place] FDA and  has been authorized for detection and/or diagnosis of SARS-CoV-2 by FDA under an Emergency Use Authorization (EUA). This EUA will remain  in effect (meaning this test can be used) for the duration of the COVID-19 declaration under Section 564(b)(1) of the Act, 21 U.S.C. section 360bbb-3(b)(1), unless the authorization is terminated or revoked sooner.    Influenza A by PCR NEGATIVE NEGATIVE Final   Influenza B by PCR NEGATIVE NEGATIVE Final    Comment: (NOTE) The Xpert Xpress SARS-CoV-2/FLU/RSV assay is intended as an  aid in  the diagnosis of influenza from Nasopharyngeal swab specimens and  should not be used as a sole basis for treatment. Nasal washings and  aspirates are unacceptable for Xpert Xpress SARS-CoV-2/FLU/RSV  testing. Fact Sheet for Patients: PinkCheek.be Fact Sheet for Healthcare Providers: GravelBags.it This test is not yet approved or cleared by the [place] FDA and  has been authorized for detection and/or diagnosis of SARS-CoV-2 by  FDA under an Emergency Use Authorization (EUA). This EUA will remain  in effect (meaning this test can be used) for the duration of the  Covid-19 declaration under Section 564(b)(1) of the Act, 21  U.S.C. section  360bbb-3(b)(1), unless the authorization is  terminated or revoked. Performed at [facility], [place] [address], [place], [place] [address]   Urine culture     Status: Abnormal   Collection Time: 08/12/19 10:00 PM   Specimen: Urine, Clean Catch  Result Value Ref Range Status   Specimen Description URINE, CLEAN CATCH  Final   Special Requests NONE  Final   Culture (A)  Final    >=100,000 COLONIES/mL GROUP B STREP(S.AGALACTIAE)ISOLATED TESTING AGAINST S. AGALACTIAE NOT ROUTINELY PERFORMED DUE TO PREDICTABILITY OF AMP/PEN/VAN SUSCEPTIBILITY. Performed at [facility], [place] [address], [place], [place] [address]    Report Status 08/14/2019 FINAL  Final    Recent Labs  Lab 08/12/19 1332  LIPASE 18   No results for input(s): AMMONIA in the last 168 hours.  Cardiac Enzymes: No results for input(s): CKTOTAL, CKMB, CKMBINDEX, TROPONINI in the last 168 hours. BNP (last 3 results) Recent Labs    08/12/19 1506  BNP 91.4      Admission status: Inpatient: Based on patients clinical presentation and evaluation of above clinical data, I have made determination that patient meets Inpatient criteria at this time.   Oswald Hillock   [facility] If 7PM-7AM, please contact night-coverage at [email or web address], Office  [phone]  password [email or web address]  08/17/2019, 11:45 AM  LOS: 5 days

## 2019-08-18 DIAGNOSIS — J9611 Chronic respiratory failure with hypoxia: Secondary | ICD-10-CM

## 2019-08-18 DIAGNOSIS — I5023 Acute on chronic systolic (congestive) heart failure: Secondary | ICD-10-CM

## 2019-08-18 DIAGNOSIS — M329 Systemic lupus erythematosus, unspecified: Secondary | ICD-10-CM

## 2019-08-18 LAB — GLUCOSE, CAPILLARY
Glucose-Capillary: 164 mg/dL — ABNORMAL HIGH (ref 70–99)
Glucose-Capillary: 231 mg/dL — ABNORMAL HIGH (ref 70–99)
Glucose-Capillary: 185 mg/dL — ABNORMAL HIGH (ref 70–99)
Glucose-Capillary: 196 mg/dL — ABNORMAL HIGH (ref 70–99)

## 2019-08-18 LAB — RENAL FUNCTION PANEL
Albumin: 2.4 g/dL — ABNORMAL LOW (ref 3.5–5.0)
Anion gap: 9 (ref 5–15)
BUN: 14 mg/dL (ref 6–20)
CO2: 27 mmol/L (ref 22–32)
Calcium: 8.9 mg/dL (ref 8.9–10.3)
Chloride: 103 mmol/L (ref 98–111)
Creatinine, Ser: 1.17 mg/dL — ABNORMAL HIGH (ref 0.44–1.00)
GFR calc Af Amer: >60 mL/min (ref >60)
GFR calc non Af Amer: 55 mL/min — ABNORMAL LOW (ref >60)
Glucose, Bld: 163 mg/dL — ABNORMAL HIGH (ref 70–99)
Phosphorus: 2.8 mg/dL (ref 2.5–4.6)
Potassium: 3.7 mmol/L (ref 3.5–5.1)
Sodium: 139 mmol/L (ref 135–145)

## 2019-08-18 LAB — CBC
HCT: 25.5 % — ABNORMAL LOW (ref 36.0–46.0)
Hemoglobin: 7.6 g/dL — ABNORMAL LOW (ref 12.0–15.0)
MCH: 28 pg (ref 26.0–34.0)
MCHC: 29.8 g/dL — ABNORMAL LOW (ref 30.0–36.0)
MCV: 94.1 fL (ref 80.0–100.0)
Platelets: 197 10*3/uL (ref 150–400)
RBC: 2.71 MIL/uL — ABNORMAL LOW (ref 3.87–5.11)
RDW: 13.7 % (ref 11.5–15.5)
WBC: 12.1 10*3/uL — ABNORMAL HIGH (ref 4.0–10.5)
nRBC: 0.4 % — ABNORMAL HIGH (ref 0.0–0.2)

## 2019-08-18 NOTE — Progress Notes (Signed)
Third and Fourth attempt to call report at number listed in previous note.  No answer.

## 2019-08-18 NOTE — Discharge Summary (Signed)
Physician Discharge Summary  [name] [id number] DOB: 02/11/1971 DOA: 08/12/2019  PCP: Bartholome Bill, MD  Admit date: 08/12/2019 Discharge date: 08/18/2019  Time spent: 50* minutes  Recommendations for Outpatient Follow-up:  1. Transfer to [facility]   Discharge Diagnoses:  Active Problems:   SLE   SJOGREN'S SYNDROME   HTN (hypertension)   Systolic CHF, acute on chronic (HCC)   Chronic respiratory failure with hypoxia (HCC)   Morbid obesity (HCC)   Liver masses   Diabetic gastroparesis (HCC)   Diabetes (HCC)   Hepatic adenoma   Hemoperitoneum   Diabetic acidosis without coma (HCC)   Acute blood loss anemia   Discharge Condition: Stable  Diet recommendation: carb modified diet  Filed Weights   08/16/19 0424 08/17/19 0449 08/18/19 0400  Weight: (!) 190.4 kg (!) 187.8 kg (!) 188.9 kg    History of present illness:  49 year old female with history of mood obesity, lupus, Sjogren's syndrome, CHF, diabetes mellitus type 2 with gastroparesis, history of CVA, hyperlipidemia, OSA came with 2-day history of nausea vomiting and sudden onset abdominal pain.  In the ED she was found to have DKA, AKI, anemia.  CT abdomen pelvis showed bleeding into the peritoneum by bleeding presumed adenoma.  She has a history of hepatic mass with negative biopsy in 2014 with ablation in late resolution.  She was started on gentle IV hydration due to CHF and drop in blood pressure, required IV fluid boluses.  She was seen by CCM, right IJ central line placed.  BP improved following fluid boluses.  DKA/acidemia slowly corrected on insulin GTT.  GI was consulted to discuss with [facility] and has been excepted for transfer currently there are no beds.  MRI was ordered but patient was unable to fit in the MRI scanner.  She was noted to be oliguric with serum creatinine rising, Foley catheter placed noted to have good urine output.  Nephrology was consulted.   Hospital Course:  1. Liver  masses-suspect multifocal hepatic adenomas with small volume hemoperitoneum.  She has history of hepatic adenomas treated with RFA in 2014.  GI was onsulted.  Plavix is currently on hold.  Will be transferred to [facility].  MRI was ordered but unsuccessful as patient would not fit in the scanner.  2. Abnormal LFTs/possible shock liver-LFTs are slowly improving.  3. Acute blood loss anemia due to #1-hemoglobin was 10.9 on 08/12/2019, hemoglobin dropped to 6.9 yesterday.  S/p 1 unit PRBC.  Today hemoglobin is 7.6.  4. Acute kidney injury-creatinine has already improved to 1.17, nephrology  has signed off. Patient's medications including Aldactone , Enteresto, Torsemide  were held.Can be restarted if renal function remains stable.  5. DKA type 2 diabetes mellitus-patient was started on IV insulin, anion gap closed, bicarb improved.  Still she was getting IV insulin with D5 as she was n.p.o. she was switched from IV insulin to Levemir 40 units subcutaneously daily.  Blood glucose is well controlled.  Started on carb modified diet.   6. Thrombocytopenia-platelet count is 1, 27000.  Continue to monitor.  7. Sjogren's syndrome-patient is on Imuran, prednisone, Plaquenil.  8. Group B strep in urine culture-likely asymptomatic bacteriuria.  UA was stable at the time of admission.  Repeat UA on 08/15/2019 is unremarkable.  9. Chronic respiratory failure with hypoxia-patient is on 2 L/min nasal cannula as needed.  10. Diabetic gastroparesis-started on carb modified diet.  11. Systolic CHF, chronic-compensated.  Torsemide, Enetersto and Aldactone are currently on hold due to  worsening renal function..  Last echo showed EF of 45 to 50%.  As per nephrology, give Lasix as needed  Procedures:    Consultations:  Gastroentrology  Nephrology   Discharge Exam: Vitals:   08/18/19 1400 08/18/19 1500  BP: 126/70 134/70  Pulse: 85 84  Resp: 14 13  Temp:  97.9 F (36.6 C)  SpO2: 100% 100%      Discharge Instructions   Discharge Instructions    Diet - low sodium heart healthy   Complete by: As directed    Increase activity slowly   Complete by: As directed      Allergies as of 08/18/2019      Reactions   Metoclopramide Other (See Comments)   Developed restless leg, akathisia type limb movements.    Codeine Itching, Rash, Other (See Comments)   Penicillins Itching   Has patient had a PCN reaction causing immediate rash, facial/tongue/throat swelling, SOB or lightheadedness with hypotension: No Has patient had a PCN reaction causing severe rash involving mucus membranes or skin necrosis: No Has patient had a PCN reaction that required hospitalization: No Has patient had a PCN reaction occurring within the last 10 years:yes If all of the above answers are "NO", then may proceed with Cephalosporin use.   Hydrocodone Rash   Percocet [oxycodone-acetaminophen] Hives, Rash   Tolerates dilaudid      Medication List    STOP taking these medications   aspirin EC 81 MG tablet   clopidogrel 75 MG tablet Commonly known as: PLAVIX   Entresto 97-103 MG Generic drug: sacubitril-valsartan   metFORMIN 500 MG 24 hr tablet Commonly known as: GLUCOPHAGE-XR   spironolactone 50 MG tablet Commonly known as: ALDACTONE   torsemide 20 MG tablet Commonly known as: DEMADEX     TAKE these medications   Accu-Chek Aviva device Use as instructed   Accu-Chek Aviva Plus test strip Generic drug: glucose blood Use to check blood sugar 4 times daily. **Needs appt for refills**   Accu-Chek Softclix Lancets lancets Use to monitor glucose levels 4 times per day; E11.9   ALPRAZolam 0.5 MG tablet Commonly known as: XANAX Take 0.5 mg 2 (two) times daily as needed by mouth for anxiety or sleep.   amLODipine 5 MG tablet Commonly known as: NORVASC Take 1 tablet (5 mg total) by mouth daily.   Azathioprine 75 MG Tabs Take 150 mg by mouth 2 (two) times daily.   B-D UF III MINI PEN  NEEDLES 31G X 5 MM Misc Generic drug: Insulin Pen Needle USE AS DIRECTED TWICE A DAY   Easy Touch Pen Needles 32G X 4 MM Misc Generic drug: Insulin Pen Needle USE TO INJECT INSULIN TWICE DAILY   benzonatate 100 MG capsule Commonly known as: TESSALON Take by mouth continuous as needed for cough.   BiDil 20-37.5 MG tablet Generic drug: isosorbide-hydrALAZINE TAKE TWO (2) TABLETS BY MOUTH THREE TIMES DAILY What changed: See the new instructions.   buPROPion 300 MG 24 hr tablet Commonly known as: WELLBUTRIN XL Take 300 mg by mouth daily.   Byetta 10 MCG Pen 10 MCG/0.04ML Sopn injection Generic drug: exenatide INJECT 0.04ML UNDER THE SKIN 2 TIMES DAILY WITH A MEAL What changed: See the new instructions.   carvedilol 25 MG tablet Commonly known as: COREG Take 37.5 mg by mouth 2 (two) times daily with a meal.   cetirizine 10 MG tablet Commonly known as: ZYRTEC Take 10 mg by mouth daily as needed for allergies.   clotrimazole-betamethasone cream Commonly known  as: LOTRISONE Apply 1 application topically 2 (two) times daily. What changed:   when to take this  reasons to take this   cyanocobalamin 1000 MCG/ML injection Commonly known as: (VITAMIN B-12) Inject into the muscle. Every other month   cycloSPORINE 0.05 % ophthalmic emulsion Commonly known as: RESTASIS Place 1 drop into both eyes 2 (two) times daily.   diclofenac sodium 1 % Gel Commonly known as: Voltaren Apply 2-4 g 4 (four) times daily as needed topically (joint pain).   doxepin 50 MG capsule Commonly known as: SINEQUAN Take 50-100 mg by mouth at bedtime as needed (sleep).   DULoxetine 60 MG capsule Commonly known as: CYMBALTA Take 120 mg by mouth at bedtime.   etonogestrel-ethinyl estradiol 0.12-0.015 MG/24HR vaginal ring Commonly known as: [place] 1 each every 28 (twenty-eight) days vaginally. Insert vaginally and leave in place for 3 consecutive weeks, then remove for 1 week.   famotidine  20 MG tablet Commonly known as: PEPCID Take 20 mg by mouth 2 (two) times daily.   fluticasone 50 MCG/ACT nasal spray Commonly known as: FLONASE Place 2 sprays into both nostrils continuous as needed for allergies or rhinitis.   hydroxychloroquine 200 MG tablet Commonly known as: PLAQUENIL Take 200 mg by mouth 2 (two) times daily.   insulin NPH Human 100 UNIT/ML injection Commonly known as: HumuLIN N Inject 2 mLs (200 Units total) into the skin every morning. And syringes 3/day What changed: how much to take   Insulin Syringe-Needle U-100 31G X 5/16" 1 ML Misc Commonly known as: Easy Touch Insulin Syringe USE AS DIRECTED THREE TIMES A DAY   Lyrica 75 MG capsule Generic drug: pregabalin Take 75 mg 3 (three) times daily by mouth.   megestrol 40 MG tablet Commonly known as: MEGACE Take 40 mg by mouth daily.   Metamucil [employer] Fiber 58.6 % powder Generic drug: psyllium Take 1 packet by mouth 2 (two) times daily.   mometasone-formoterol 100-5 MCG/ACT Aero Commonly known as: DULERA Inhale 2 puffs into the lungs 2 (two) times daily.   oxybutynin 10 MG 24 hr tablet Commonly known as: DITROPAN-XL Take 15 mg by mouth daily.   pravastatin 10 MG tablet Commonly known as: PRAVACHOL Take 10 mg by mouth daily.   predniSONE 1 MG tablet Commonly known as: DELTASONE Take 3 mg by mouth daily.   albuterol (2.5 MG/3ML) 0.083% nebulizer solution Commonly known as: PROVENTIL Take 2.5 mg by nebulization every 6 (six) hours as needed for wheezing.   ProAir HFA 108 (90 Base) MCG/ACT inhaler Generic drug: albuterol Inhale 2 puffs into the lungs every 6 (six) hours as needed for wheezing or shortness of breath.   REFRESH OP Place 1 drop 2 (two) times daily into both eyes.   sucralfate 1 GM/10ML suspension Commonly known as: CARAFATE Take 1 g by mouth 2 (two) times daily.   topiramate 25 MG tablet Commonly known as: TOPAMAX Take 75 mg by mouth at bedtime.   traMADol 50 MG  tablet Commonly known as: ULTRAM TAKE 1 TO 2 TABLETS BY MOUTH EVERY 6 HOURS AS NEEDED FOR PAIN**PA REQ** What changed:   how much to take  how to take this  when to take this  reasons to take this  additional instructions   Vitamin D (Ergocalciferol) 1.25 MG (50000 UNIT) Caps capsule Commonly known as: DRISDOL Take 50,000 Units by mouth every 7 (seven) days.      Allergies  Allergen Reactions  . Metoclopramide Other (See Comments)  Developed restless leg, akathisia type limb movements.   . Codeine Itching, Rash and Other (See Comments)  . Penicillins Itching    Has patient had a PCN reaction causing immediate rash, facial/tongue/throat swelling, SOB or lightheadedness with hypotension: No Has patient had a PCN reaction causing severe rash involving mucus membranes or skin necrosis: No Has patient had a PCN reaction that required hospitalization: No Has patient had a PCN reaction occurring within the last 10 years:yes If all of the above answers are "NO", then may proceed with Cephalosporin use.  Marland Kitchen Hydrocodone Rash  . Percocet [Oxycodone-Acetaminophen] Hives and Rash    Tolerates dilaudid      The results of significant diagnostics from this hospitalization (including imaging, microbiology, ancillary and laboratory) are listed below for reference.    Significant Diagnostic Studies: DG Chest 2 View  Result Date: 08/12/2019 CLINICAL DATA:  49 year old female with history of shortness of breath. EXAM: CHEST - 2 VIEW COMPARISON:  Chest x-ray 03/26/2018. FINDINGS: Lung volumes are low. Bibasilar opacities which may reflect areas of atelectasis and/or consolidation. No definite pleural effusions. No evidence of pulmonary edema. Mild cardiomegaly. Upper mediastinal contours are within normal limits. IMPRESSION: 1. Low lung volumes with bibasilar opacities which may be areas of atelectasis and/or consolidation. Electronically Signed   By: Vinnie Langton M.D.   On: 08/12/2019  16:17   CT Angio Chest PE W and/or Wo Contrast  Result Date: 08/12/2019 CLINICAL DATA:  49 year old female with shortness of breath. Nausea vomiting. Epigastric pain and abnormal abdomen ultrasound earlier today suspicious for hepatic metastatic disease. History of lupus. EXAM: CT ANGIOGRAPHY CHEST CT ABDOMEN AND PELVIS WITH CONTRAST TECHNIQUE: Multidetector CT imaging of the chest was performed using the standard protocol during bolus administration of intravenous contrast. Multiplanar CT image reconstructions and MIPs were obtained to evaluate the vascular anatomy. Multidetector CT imaging of the abdomen and pelvis was performed using the standard protocol during bolus administration of intravenous contrast. CONTRAST:  160 milliliters OMNIPAQUE IOHEXOL 350 MG/ML SOLN, 50mL OMNIPAQUE IOHEXOL 350 MG/ML SOLN COMPARISON:  CTA chest 03/26/2018. CT Abdomen and Pelvis 01/05/2016. FINDINGS: CTA CHEST FINDINGS Cardiovascular: Late but adequate contrast bolus timing in the pulmonary arterial tree. Superimposed respiratory motion and low lung volumes. There is no central or hilar pulmonary artery filling defect. But the bilateral pulmonary artery branches are not well evaluated despite repeated contrast bolus attempt. Negative thoracic aorta. Mild cardiomegaly is stable to improved since 2019. No pericardial effusion. No calcified coronary artery atherosclerosis is evident. Mediastinum/Nodes: Negative.  No mediastinal lymphadenopathy. Lungs/Pleura: Low lung volumes with patent major airways aside from atelectatic changes. Nonspecific scattered and asymmetric bilateral pulmonary ground-glass opacity with confluent peribronchial opacity in the superior segment of the right lower lobe (series 4, image 53). Other bilateral lower lobe mildly confluent peribronchial opacity more resembles atelectasis. No pleural effusion. Musculoskeletal: No acute or suspicious osseous lesion identified in the chest. Review of the MIP images  confirms the above findings. CT ABDOMEN and PELVIS FINDINGS Hepatobiliary: Multiple large at least 6-7 millimeter areas of abnormally decreased and heterogeneous liver enhancement in the right lobe are poorly marginated and masslike. The right lobe appears mildly expanded compared to 2017. The left lobe may be spared. The gallbladder is grossly normal. There is a small volume of intermediate density free fluid adjacent to the liver compatible with hemoperitoneum. Pancreas: Negative. Spleen: Diminutive and negative. Adrenals/Urinary Tract: Adrenal glands appear normal. No hydronephrosis. Bilateral renal enhancement is symmetric and within normal limits but on delayed images  there is minimal contrast excretion from both kidneys. The proximal ureters are decompressed. Diminutive and unremarkable urinary bladder. There is a small 2 millimeter upper pole right renal calculus which is new from 2017. Stomach/Bowel: Largely decompressed and negative large bowel from the splenic flexure distally. There may be mild sigmoid diverticulosis. Redundant proximal transverse colon with mild retained stool. Similar mild retained stool in the right colon. No large bowel inflammation is evident. Terminal ileum appears negative. Normal appendix suspected on coronal image 72. No dilated small bowel. Mildly fluid distended but otherwise negative stomach. No free air. Free fluid with intermediate density mostly around the liver and in both gutters, small volume overall. Vascular/Lymphatic: Arterial phase images of the abdomen and pelvis were included on the chest study. Abdominal aorta, its branches, iliac and proximal femoral arteries appear patent and within normal limits. On these images the portal venous system is not well evaluated. The main portal vein and SMV appear diminutive. Portal vein was also not well visualized on ultrasound earlier today. No lymphadenopathy. Reproductive: Stable and negative allowing for adjacent  hemoperitoneum. Other: Intermediate density pelvic free fluid such as due to hemoperitoneum (series 9, image 82). Volume is moderate and both lower quadrants are also affected. Musculoskeletal: Lower lumbar facet degeneration. No destructive or suspicious osseous lesion identified. Review of the MIP images confirms the above findings. IMPRESSION: 1. Suboptimal CTA chest for PE with no central or hilar pulmonary embolus. If there is strong clinical suspicion a pulmonary V/Q scan would be complementary to evaluate the segmental and distal branches. 2. Highly abnormal liver with multifocal infiltrative mass-like areas of abnormal enhancement in the right lobe and associated small volume of Hemoperitoneum in the abdomen and pelvis. Top differential considerations include hemorrhagic Hepatocellular Carcinoma or Hepatic Adenomas. These were not evident on a 2017 CT Abdomen and Pelvis. 3. Pulmonary atelectasis and additional nonspecific ground-glass opacity. Acute pulmonary infection or aspiration difficult to exclude. No pleural effusion. 4. Little renal contrast excretion on the delayed images, but no obstructive uropathy. Consider ATN or other intrinsic acute renal failure. 5. Cardiomegaly is stable or mildly improved since 2019. Study discussed by telephone with Dr. Dorie Rank on 08/12/2019 at 20:50 . Electronically Signed   By: Genevie Ann M.D.   On: 08/12/2019 20:56   CT ABDOMEN PELVIS W CONTRAST  Result Date: 08/12/2019 CLINICAL DATA:  49 year old female with shortness of breath. Nausea vomiting. Epigastric pain and abnormal abdomen ultrasound earlier today suspicious for hepatic metastatic disease. History of lupus. EXAM: CT ANGIOGRAPHY CHEST CT ABDOMEN AND PELVIS WITH CONTRAST TECHNIQUE: Multidetector CT imaging of the chest was performed using the standard protocol during bolus administration of intravenous contrast. Multiplanar CT image reconstructions and MIPs were obtained to evaluate the vascular anatomy.  Multidetector CT imaging of the abdomen and pelvis was performed using the standard protocol during bolus administration of intravenous contrast. CONTRAST:  160 milliliters OMNIPAQUE IOHEXOL 350 MG/ML SOLN, 47mL OMNIPAQUE IOHEXOL 350 MG/ML SOLN COMPARISON:  CTA chest 03/26/2018. CT Abdomen and Pelvis 01/05/2016. FINDINGS: CTA CHEST FINDINGS Cardiovascular: Late but adequate contrast bolus timing in the pulmonary arterial tree. Superimposed respiratory motion and low lung volumes. There is no central or hilar pulmonary artery filling defect. But the bilateral pulmonary artery branches are not well evaluated despite repeated contrast bolus attempt. Negative thoracic aorta. Mild cardiomegaly is stable to improved since 2019. No pericardial effusion. No calcified coronary artery atherosclerosis is evident. Mediastinum/Nodes: Negative.  No mediastinal lymphadenopathy. Lungs/Pleura: Low lung volumes with patent major airways aside  from atelectatic changes. Nonspecific scattered and asymmetric bilateral pulmonary ground-glass opacity with confluent peribronchial opacity in the superior segment of the right lower lobe (series 4, image 53). Other bilateral lower lobe mildly confluent peribronchial opacity more resembles atelectasis. No pleural effusion. Musculoskeletal: No acute or suspicious osseous lesion identified in the chest. Review of the MIP images confirms the above findings. CT ABDOMEN and PELVIS FINDINGS Hepatobiliary: Multiple large at least 6-7 millimeter areas of abnormally decreased and heterogeneous liver enhancement in the right lobe are poorly marginated and masslike. The right lobe appears mildly expanded compared to 2017. The left lobe may be spared. The gallbladder is grossly normal. There is a small volume of intermediate density free fluid adjacent to the liver compatible with hemoperitoneum. Pancreas: Negative. Spleen: Diminutive and negative. Adrenals/Urinary Tract: Adrenal glands appear normal. No  hydronephrosis. Bilateral renal enhancement is symmetric and within normal limits but on delayed images there is minimal contrast excretion from both kidneys. The proximal ureters are decompressed. Diminutive and unremarkable urinary bladder. There is a small 2 millimeter upper pole right renal calculus which is new from 2017. Stomach/Bowel: Largely decompressed and negative large bowel from the splenic flexure distally. There may be mild sigmoid diverticulosis. Redundant proximal transverse colon with mild retained stool. Similar mild retained stool in the right colon. No large bowel inflammation is evident. Terminal ileum appears negative. Normal appendix suspected on coronal image 72. No dilated small bowel. Mildly fluid distended but otherwise negative stomach. No free air. Free fluid with intermediate density mostly around the liver and in both gutters, small volume overall. Vascular/Lymphatic: Arterial phase images of the abdomen and pelvis were included on the chest study. Abdominal aorta, its branches, iliac and proximal femoral arteries appear patent and within normal limits. On these images the portal venous system is not well evaluated. The main portal vein and SMV appear diminutive. Portal vein was also not well visualized on ultrasound earlier today. No lymphadenopathy. Reproductive: Stable and negative allowing for adjacent hemoperitoneum. Other: Intermediate density pelvic free fluid such as due to hemoperitoneum (series 9, image 82). Volume is moderate and both lower quadrants are also affected. Musculoskeletal: Lower lumbar facet degeneration. No destructive or suspicious osseous lesion identified. Review of the MIP images confirms the above findings. IMPRESSION: 1. Suboptimal CTA chest for PE with no central or hilar pulmonary embolus. If there is strong clinical suspicion a pulmonary V/Q scan would be complementary to evaluate the segmental and distal branches. 2. Highly abnormal liver with  multifocal infiltrative mass-like areas of abnormal enhancement in the right lobe and associated small volume of Hemoperitoneum in the abdomen and pelvis. Top differential considerations include hemorrhagic Hepatocellular Carcinoma or Hepatic Adenomas. These were not evident on a 2017 CT Abdomen and Pelvis. 3. Pulmonary atelectasis and additional nonspecific ground-glass opacity. Acute pulmonary infection or aspiration difficult to exclude. No pleural effusion. 4. Little renal contrast excretion on the delayed images, but no obstructive uropathy. Consider ATN or other intrinsic acute renal failure. 5. Cardiomegaly is stable or mildly improved since 2019. Study discussed by telephone with Dr. Dorie Rank on 08/12/2019 at 20:50 . Electronically Signed   By: Genevie Ann M.D.   On: 08/12/2019 20:56   DG CHEST PORT 1 VIEW  Result Date: 08/14/2019 CLINICAL DATA:  Shortness of breath EXAM: PORTABLE CHEST 1 VIEW COMPARISON:  Yesterday FINDINGS: Cardiomegaly and vascular pedicle widening. Congested appearance of vessels which may have progressed. Right IJ line with tip at the SVC. No visible effusion or pneumothorax. IMPRESSION: Cardiomegaly  with vascular congestion that may have progressed from yesterday. Electronically Signed   By: Monte Fantasia M.D.   On: 08/14/2019 05:17   DG Chest Portable 1 View  Result Date: 08/13/2019 CLINICAL DATA:  Post central line placement. EXAM: PORTABLE CHEST 1 VIEW COMPARISON:  Chest radiograph 08/12/2019 FINDINGS: Interval placement of a right IJ approach central venous catheter with tip projecting in the region of the superior vena cava. Overlying cardiac monitoring leads. Unchanged mild cardiomegaly. Shallow inspiration radiograph. Persistent bibasilar opacities which may reflect atelectasis or pneumonia. No definite pleural effusion or evidence of pneumothorax. IMPRESSION: Interval placement of a right IJ approach central venous catheter with tip projecting in the region of the  superior vena cava. Persistent low lung volumes and bibasilar opacities which may reflect atelectasis and/or pneumonia. Electronically Signed   By: Kellie Simmering DO   On: 08/13/2019 09:17   US Abdomen Limited RUQ  Result Date: 08/12/2019 CLINICAL DATA:  Epigastric pain. EXAM: ULTRASOUND ABDOMEN LIMITED RIGHT UPPER QUADRANT COMPARISON:  Abdominopelvic CT 01/05/2016. Abdominal ultrasound 02/28/2015. FINDINGS: Gallbladder: No gallstones or wall thickening visualized. No sonographic Murphy sign noted by sonographer. Common bile duct: Diameter: Not visualized.  No biliary dilatation identified. Liver: The liver is very heterogeneous in echotexture. There are probable ill-defined masses within the left lobe. A predominately hypoechoic mass measures approximately 10.2 x 9.1 x 8.5 cm. There is a more echogenic infiltrative mass in the left lobe measuring 6.9 x 5.6 x 8.7 cm. These lesions were not present previously. The portal vein is not well visualized. Other: Small amount of ascites. IMPRESSION: 1. New diffuse heterogeneity of the hepatic parenchyma with ill-defined masses in the left hepatic lobe, suspicious for malignancy. Further evaluation with abdominopelvic CT with contrast recommended. 2. The portal vein and common bile duct are not well visualized. No biliary dilatation identified. 3. The gallbladder appears normal. Electronically Signed   By: Richardean Sale M.D.   On: 08/12/2019 16:17    Microbiology: Recent Results (from the past 240 hour(s))  Respiratory Panel by RT PCR (Flu A&B, Covid) - Nasopharyngeal Swab     Status: None   Collection Time: 08/12/19  3:06 PM   Specimen: Nasopharyngeal Swab  Result Value Ref Range Status   SARS Coronavirus 2 by RT PCR NEGATIVE NEGATIVE Final    Comment: (NOTE) SARS-CoV-2 target nucleic acids are NOT DETECTED. The SARS-CoV-2 RNA is generally detectable in upper respiratoy specimens during the acute phase of infection. The lowest concentration of SARS-CoV-2  viral copies this assay can detect is 131 copies/mL. A negative result does not preclude SARS-Cov-2 infection and should not be used as the sole basis for treatment or other patient management decisions. A negative result may occur with  improper specimen collection/handling, submission of specimen other than nasopharyngeal swab, presence of viral mutation(s) within the areas targeted by this assay, and inadequate number of viral copies (<131 copies/mL). A negative result must be combined with clinical observations, patient history, and epidemiological information. The expected result is Negative. Fact Sheet for Patients:  PinkCheek.be Fact Sheet for Healthcare Providers:  GravelBags.it This test is not yet ap proved or cleared by the [place] FDA and  has been authorized for detection and/or diagnosis of SARS-CoV-2 by FDA under an Emergency Use Authorization (EUA). This EUA will remain  in effect (meaning this test can be used) for the duration of the COVID-19 declaration under Section 564(b)(1) of the Act, 21 U.S.C. section 360bbb-3(b)(1), unless the authorization is terminated or revoked sooner.  Influenza A by PCR NEGATIVE NEGATIVE Final   Influenza B by PCR NEGATIVE NEGATIVE Final    Comment: (NOTE) The Xpert Xpress SARS-CoV-2/FLU/RSV assay is intended as an aid in  the diagnosis of influenza from Nasopharyngeal swab specimens and  should not be used as a sole basis for treatment. Nasal washings and  aspirates are unacceptable for Xpert Xpress SARS-CoV-2/FLU/RSV  testing. Fact Sheet for Patients: PinkCheek.be Fact Sheet for Healthcare Providers: GravelBags.it This test is not yet approved or cleared by the [place] FDA and  has been authorized for detection and/or diagnosis of SARS-CoV-2 by  FDA under an Emergency Use Authorization (EUA). This EUA will  remain  in effect (meaning this test can be used) for the duration of the  Covid-19 declaration under Section 564(b)(1) of the Act, 21  U.S.C. section 360bbb-3(b)(1), unless the authorization is  terminated or revoked. Performed at [facility], [place] [address], [place], [place] [address]   Urine culture     Status: Abnormal   Collection Time: 08/12/19 10:00 PM   Specimen: Urine, Clean Catch  Result Value Ref Range Status   Specimen Description URINE, CLEAN CATCH  Final   Special Requests NONE  Final   Culture (A)  Final    >=100,000 COLONIES/mL GROUP B STREP(S.AGALACTIAE)ISOLATED TESTING AGAINST S. AGALACTIAE NOT ROUTINELY PERFORMED DUE TO PREDICTABILITY OF AMP/PEN/VAN SUSCEPTIBILITY. Performed at [facility], [place] [address] [place], [place] [address]    Report Status 08/14/2019 FINAL  Final     Labs: Basic Metabolic Panel: Recent Labs  Lab 08/12/19 1506 08/12/19 1523 08/14/19 0820 08/15/19 0432 08/16/19 0212 08/17/19 0545 08/18/19 0415  NA  --    < > 140 140 138 138 139  K  --    < > 4.4 4.0 3.7 3.4* 3.7  CL  --    < > 111 110 107 104 103  CO2  --    < > 21* 22 24 27 27   GLUCOSE  --    < > 184* 169* 156* 145* 163*  BUN  --    < > 37* 36* 28* 16 14  CREATININE  --    < > 4.49* 3.14* 1.88* 1.31* 1.17*  CALCIUM  --    < > 8.2* 8.1* 8.1* 8.4* 8.9  MG 1.8  --   --   --   --   --   --   PHOS  --   --   --   --  3.0 2.8 2.8   < > = values in this interval not displayed.   Liver Function Tests: Recent Labs  Lab 08/12/19 1332 08/12/19 1332 08/13/19 0606 08/13/19 0606 08/14/19 0412 08/15/19 0432 08/16/19 0212 08/17/19 0545 08/18/19 0415  AST 260*  --  1,139*  --  537* 196*  --   --   --   ALT 160*  --  739*  --  487* 305*  --   --   --   ALKPHOS 76  --  84  --  93 106  --   --   --   BILITOT 0.4  --  0.8  --  0.8 1.0  --   --   --   PROT 6.6  --  7.0  --  6.2* 6.2*  --   --   --   ALBUMIN 3.1*   < > 3.4*   < > 3.0* 2.8* 2.6* 2.3* 2.4*   < > = values  in this interval not displayed.   Recent Labs  Lab 08/12/19 1332  LIPASE 18   No results for input(s): AMMONIA in the last 168 hours. CBC: Recent Labs  Lab 08/14/19 0530 08/14/19 0530 08/15/19 0432 08/15/19 1536 08/16/19 0620 08/16/19 1455 08/16/19 2230 08/17/19 0545 08/18/19 0415  WBC 14.5*  --  14.5*  --  12.0*  --   --  11.7* 12.1*  NEUTROABS 11.4*  --   --   --   --   --   --   --   --   HGB 7.9*   < > 7.3*   < > 6.9* 7.3* 7.7* 7.3* 7.6*  HCT 25.8*   < > 23.2*   < > 22.2* 23.2* 24.5* 23.8* 25.5*  MCV 93.1  --  92.1  --  93.3  --   --  91.9 94.1  PLT 139*  --  127*  --  129*  --   --  157 197   < > = values in this interval not displayed.   Cardiac Enzymes: No results for input(s): CKTOTAL, CKMB, CKMBINDEX, TROPONINI in the last 168 hours. BNP: BNP (last 3 results) Recent Labs    08/12/19 1506  BNP 91.4    ProBNP (last 3 results) No results for input(s): PROBNP in the last 8760 hours.  CBG: Recent Labs  Lab 08/17/19 1139 08/17/19 1636 08/17/19 2050 08/18/19 0635 08/18/19 1116  GLUCAP 223* 209* 213* 164* 231*       Signed:  Oswald Hillock MD.  [facility] 08/18/2019, 4:02 PM

## 2019-08-18 NOTE — Progress Notes (Signed)
Report given to [place] at [place]. Her Direct number is [phone].  Call to be placed to [place] by secretary.

## 2019-08-18 NOTE — Progress Notes (Signed)
Bed received for patient at [facility], bed # [id number].  Phone number provided for report: [phone]   One call placed to attempt to provide report, no answer. Will try again.   MD notified of bed availability and plans for transfer via text page.  (3e09 just FYI pt now has bed at [facility]. Working on transfer/report/etc)

## 2019-08-18 NOTE — Progress Notes (Addendum)
[facility]  PROGRESS NOTE  [name] [id number] DOB: 12-31-1970 DOA: 08/12/2019 PCP: Bartholome Bill, MD   Brief HPI:   49 year old female with history of mood obesity, lupus, Sjogren's syndrome, CHF, diabetes mellitus type 2 with gastroparesis, history of CVA, hyperlipidemia, OSA came with 2-day history of nausea vomiting and sudden onset abdominal pain.  In the ED she was found to have DKA, AKI, anemia.  CT abdomen pelvis showed bleeding into the peritoneum by bleeding presumed adenoma.  She has a history of hepatic mass with negative biopsy in 2014 with ablation in late resolution.  She was started on gentle IV hydration due to CHF and drop in blood pressure, required IV fluid boluses.  She was seen by CCM, right IJ central line placed.  BP improved following fluid boluses.  DKA/acidemia slowly corrected on insulin GTT.  GI was consulted to discuss with [facility] and has been excepted for transfer currently there are no beds.  MRI was ordered but patient was unable to fit in the MRI scanner.  She was noted to be oliguric with serum creatinine rising, Foley catheter placed noted to have good urine output.  Nephrology was consulted.   Subjective  Patient seen and examined, denies any complaints.  Awaiting transfer to [facility].   Assessment/Plan:     1. Liver masses-suspect multifocal hepatic adenomas with small volume hemoperitoneum.  She has history of hepatic adenomas treated with RFA in 2014.  GI is following.  Plavix is currently on hold.  Awaiting transfer to [facility].  MRI was ordered but unsuccessful as patient would not fit in the scanner.  2. Abnormal LFTs/possible shock liver-LFTs are slowly improving.  3. Acute blood loss anemia due to #1-hemoglobin was 10.9 on 08/12/2019, hemoglobin dropped to 6.9 yesterday.  S/p 1 unit PRBC.  Today hemoglobin is 7.6.  4. Acute kidney injury-creatinine has already improved to 1.17, nephrology  has signed off.  5. DKA type 2 diabetes  mellitus-patient was started on IV insulin, anion gap closed, bicarb improved.  Still she was getting IV insulin with D5 as she was n.p.o. she was switched from IV insulin to Levemir 40 units subcutaneously daily.  Blood glucose is well controlled.  Started on carb modified diet.   6. Thrombocytopenia-platelet count is 1, 27000.  Continue to monitor.  7. Sjogren's syndrome-patient is on Imuran, prednisone, Plaquenil.  8. Group B strep in urine culture-likely asymptomatic bacteriuria.  UA was stable at the time of admission.  Repeat UA on 08/15/2019 is unremarkable.  9. Chronic respiratory failure with hypoxia-patient is on 2 L/min nasal cannula as needed.  10. Diabetic gastroparesis-started on a modified diet.  11. Systolic CHF, chronic-compensated.  Torsemide and Aldactone are currently on hold due to worsening renal function..  Last echo showed EF of 45 to 50%.  As per nephrology, give Lasix as needed.    SpO2: 99 % O2 Flow Rate (L/min): 2 L/min FiO2 (%): 32 %    Lab Results  Component Value Date   SARSCOV2NAA NEGATIVE 08/12/2019     CBG: Recent Labs  Lab 08/17/19 0610 08/17/19 1139 08/17/19 1636 08/17/19 2050 08/18/19 0635  GLUCAP 143* 223* 209* 213* 164*    CBC: Recent Labs  Lab 08/14/19 0530 08/14/19 0530 08/15/19 0432 08/15/19 1536 08/16/19 0620 08/16/19 1455 08/16/19 2230 08/17/19 0545 08/18/19 0415  WBC 14.5*  --  14.5*  --  12.0*  --   --  11.7* 12.1*  NEUTROABS 11.4*  --   --   --   --   --   --   --   --  HGB 7.9*   < > 7.3*   < > 6.9* 7.3* 7.7* 7.3* 7.6*  HCT 25.8*   < > 23.2*   < > 22.2* 23.2* 24.5* 23.8* 25.5*  MCV 93.1  --  92.1  --  93.3  --   --  91.9 94.1  PLT 139*  --  127*  --  129*  --   --  157 197   < > = values in this interval not displayed.    Basic Metabolic Panel: Recent Labs  Lab 08/12/19 1506 08/12/19 1523 08/14/19 0820 08/15/19 0432 08/16/19 0212 08/17/19 0545 08/18/19 0415  NA  --    < > 140 140 138 138 139  K  --     < > 4.4 4.0 3.7 3.4* 3.7  CL  --    < > 111 110 107 104 103  CO2  --    < > 21* 22 24 27 27   GLUCOSE  --    < > 184* 169* 156* 145* 163*  BUN  --    < > 37* 36* 28* 16 14  CREATININE  --    < > 4.49* 3.14* 1.88* 1.31* 1.17*  CALCIUM  --    < > 8.2* 8.1* 8.1* 8.4* 8.9  MG 1.8  --   --   --   --   --   --   PHOS  --   --   --   --  3.0 2.8 2.8   < > = values in this interval not displayed.     Liver Function Tests: Recent Labs  Lab 08/12/19 1332 08/12/19 1332 08/13/19 0606 08/13/19 0606 08/14/19 0412 08/15/19 0432 08/16/19 0212 08/17/19 0545 08/18/19 0415  AST 260*  --  1,139*  --  537* 196*  --   --   --   ALT 160*  --  739*  --  487* 305*  --   --   --   ALKPHOS 76  --  84  --  93 106  --   --   --   BILITOT 0.4  --  0.8  --  0.8 1.0  --   --   --   PROT 6.6  --  7.0  --  6.2* 6.2*  --   --   --   ALBUMIN 3.1*   < > 3.4*   < > 3.0* 2.8* 2.6* 2.3* 2.4*   < > = values in this interval not displayed.        DVT prophylaxis: SCDs  Code Status: Full code  Family Communication: No family at bedside  Disposition Plan: Awaiting transfer [facility], pending bed availability.    Scheduled medications:  . azaTHIOprine  150 mg Oral BID  . buPROPion  300 mg Oral Daily  . carvedilol  12.5 mg Oral BID WC  . Chlorhexidine Gluconate Cloth  6 each Topical Daily  . cycloSPORINE  1 drop Both Eyes BID  . DULoxetine  120 mg Oral QHS  . famotidine  20 mg Oral Daily  . hydroxychloroquine  200 mg Oral BID  . insulin aspart  0-9 Units Subcutaneous TID WC  . insulin detemir  40 Units Subcutaneous Daily  . isosorbide-hydrALAZINE  2 tablet Oral TID  . loratadine  10 mg Oral Daily  . megestrol  40 mg Oral Daily  . mometasone-formoterol  2 puff Inhalation BID  . oxybutynin  15 mg Oral Daily  . predniSONE  3 mg  Oral Daily  . pregabalin  75 mg Oral BID  . psyllium  1 packet Oral BID  . sodium chloride flush  10-40 mL Intracatheter Q12H  . sucralfate  1 g Oral BID  . topiramate  75 mg  Oral QHS    Consultants:  GI  Nephrology  PCCM  Procedures:  Antibiotics:   Anti-infectives (From admission, onward)   Start     Dose/Rate Route Frequency Ordered Stop   08/13/19 0230  hydroxychloroquine (PLAQUENIL) tablet 200 mg     200 mg Oral 2 times daily 08/13/19 0208         Objective   Vitals:   08/18/19 0720 08/18/19 0800 08/18/19 0830 08/18/19 0843  BP:  124/64  124/64  Pulse:  95  96  Resp:  17 17 18   Temp:    98.4 F (36.9 C)  TempSrc:    Oral  SpO2: 92% 100%  99%  Weight:      Height:        Intake/Output Summary (Last 24 hours) at 08/18/2019 1044 Last data filed at 08/18/2019 0848 Gross per 24 hour  Intake 1090 ml  Output 1850 ml  Net -760 ml    01/15 1901 - 01/17 0700 In: 1570 [P.O.:1560; I.V.:10] Out: 2950 [Urine:2950]  Filed Weights   08/16/19 0424 08/17/19 0449 08/18/19 0400  Weight: (!) 190.4 kg (!) 187.8 kg (!) 188.9 kg    Physical Examination:  General-appears in no acute distress Heart-S1-S2, regular, no murmur auscultated Lungs-clear to auscultation bilaterally, no wheezing or crackles auscultated Abdomen-soft, nontender, no organomegaly Extremities-no edema in the lower extremities Neuro-alert, oriented x3, no focal deficit noted   Data Reviewed:   Recent Results (from the past 240 hour(s))  Respiratory Panel by RT PCR (Flu A&B, Covid) - Nasopharyngeal Swab     Status: None   Collection Time: 08/12/19  3:06 PM   Specimen: Nasopharyngeal Swab  Result Value Ref Range Status   SARS Coronavirus 2 by RT PCR NEGATIVE NEGATIVE Final    Comment: (NOTE) SARS-CoV-2 target nucleic acids are NOT DETECTED. The SARS-CoV-2 RNA is generally detectable in upper respiratoy specimens during the acute phase of infection. The lowest concentration of SARS-CoV-2 viral copies this assay can detect is 131 copies/mL. A negative result does not preclude SARS-Cov-2 infection and should not be used as the sole basis for treatment or other  patient management decisions. A negative result may occur with  improper specimen collection/handling, submission of specimen other than nasopharyngeal swab, presence of viral mutation(s) within the areas targeted by this assay, and inadequate number of viral copies (<131 copies/mL). A negative result must be combined with clinical observations, patient history, and epidemiological information. The expected result is Negative. Fact Sheet for Patients:  PinkCheek.be Fact Sheet for Healthcare Providers:  GravelBags.it This test is not yet ap proved or cleared by the [place] FDA and  has been authorized for detection and/or diagnosis of SARS-CoV-2 by FDA under an Emergency Use Authorization (EUA). This EUA will remain  in effect (meaning this test can be used) for the duration of the COVID-19 declaration under Section 564(b)(1) of the Act, 21 U.S.C. section 360bbb-3(b)(1), unless the authorization is terminated or revoked sooner.    Influenza A by PCR NEGATIVE NEGATIVE Final   Influenza B by PCR NEGATIVE NEGATIVE Final    Comment: (NOTE) The Xpert Xpress SARS-CoV-2/FLU/RSV assay is intended as an aid in  the diagnosis of influenza from Nasopharyngeal swab specimens and  should not be  used as a sole basis for treatment. Nasal washings and  aspirates are unacceptable for Xpert Xpress SARS-CoV-2/FLU/RSV  testing. Fact Sheet for Patients: PinkCheek.be Fact Sheet for Healthcare Providers: GravelBags.it This test is not yet approved or cleared by the [place] FDA and  has been authorized for detection and/or diagnosis of SARS-CoV-2 by  FDA under an Emergency Use Authorization (EUA). This EUA will remain  in effect (meaning this test can be used) for the duration of the  Covid-19 declaration under Section 564(b)(1) of the Act, 21  U.S.C. section 360bbb-3(b)(1), unless  the authorization is  terminated or revoked. Performed at [facility], [place] [address] [place], [place] [address]   Urine culture     Status: Abnormal   Collection Time: 08/12/19 10:00 PM   Specimen: Urine, Clean Catch  Result Value Ref Range Status   Specimen Description URINE, CLEAN CATCH  Final   Special Requests NONE  Final   Culture (A)  Final    >=100,000 COLONIES/mL GROUP B STREP(S.AGALACTIAE)ISOLATED TESTING AGAINST S. AGALACTIAE NOT ROUTINELY PERFORMED DUE TO PREDICTABILITY OF AMP/PEN/VAN SUSCEPTIBILITY. Performed at [facility], [place] [address] [place],  [address]    Report Status 08/14/2019 FINAL  Final    Recent Labs  Lab 08/12/19 1332  LIPASE 18   No results for input(s): AMMONIA in the last 168 hours.  Cardiac Enzymes: No results for input(s): CKTOTAL, CKMB, CKMBINDEX, TROPONINI in the last 168 hours. BNP (last 3 results) Recent Labs    08/12/19 1506  BNP 91.4      Admission status: Inpatient: Based on patients clinical presentation and evaluation of above clinical data, I have made determination that patient meets Inpatient criteria at this time.   Oswald Hillock   [facility] If 7PM-7AM, please contact night-coverage at [email or web address], Office  [phone]  password [email or web address]  08/18/2019, 10:44 AM  LOS: 6 days

## 2019-08-18 NOTE — Progress Notes (Signed)
Rounded on patient during her lunch, she is on a call with family, and currently denies complaints/needs.

## 2019-08-18 NOTE — Progress Notes (Signed)
Patient states she prefers to take metamucil in the evening instead of morning. rescheduled for this evening.

## 2019-08-18 NOTE — Progress Notes (Signed)
Legrand Como in [place] will be 'power-sharing' the radiology records from this visit to [facility]

## 2019-08-18 NOTE — Progress Notes (Signed)
Patient sleeping at start of shift report, patient wakes for RN introduction, denies complaints at this time.

## 2019-08-19 DIAGNOSIS — M329 Systemic lupus erythematosus, unspecified: Secondary | ICD-10-CM | POA: Insufficient documentation

## 2019-08-19 DIAGNOSIS — Z8673 Personal history of transient ischemic attack (TIA), and cerebral infarction without residual deficits: Secondary | ICD-10-CM | POA: Insufficient documentation

## 2019-08-19 DIAGNOSIS — F39 Unspecified mood [affective] disorder: Secondary | ICD-10-CM | POA: Insufficient documentation

## 2019-08-19 DIAGNOSIS — F324 Major depressive disorder, single episode, in partial remission: Secondary | ICD-10-CM | POA: Insufficient documentation

## 2019-08-19 DIAGNOSIS — I639 Cerebral infarction, unspecified: Secondary | ICD-10-CM | POA: Insufficient documentation

## 2019-08-19 LAB — GLUCOSE, CAPILLARY: Glucose-Capillary: 218 mg/dL — ABNORMAL HIGH (ref 70–99)

## 2019-08-19 NOTE — Progress Notes (Signed)
Patient transported to [place] via life flight.

## 2019-08-23 ENCOUNTER — Inpatient Hospital Stay (HOSPITAL_COMMUNITY)
Admission: AD | Admit: 2019-08-23 | Discharge: 2019-08-29 | DRG: 948 | Disposition: A | Discharge status: Rehabilitation Facility | Payer: Medicaid Other | Source: Other Acute Inpatient Hospital | Attending: Internal Medicine | Admitting: Internal Medicine

## 2019-08-23 DIAGNOSIS — J9611 Chronic respiratory failure with hypoxia: Secondary | ICD-10-CM | POA: Diagnosis present

## 2019-08-23 DIAGNOSIS — Z880 Allergy status to penicillin: Secondary | ICD-10-CM

## 2019-08-23 DIAGNOSIS — E1122 Type 2 diabetes mellitus with diabetic chronic kidney disease: Secondary | ICD-10-CM | POA: Diagnosis present

## 2019-08-23 DIAGNOSIS — Z20822 Contact with and (suspected) exposure to covid-19: Secondary | ICD-10-CM | POA: Diagnosis present

## 2019-08-23 DIAGNOSIS — M329 Systemic lupus erythematosus, unspecified: Secondary | ICD-10-CM | POA: Diagnosis present

## 2019-08-23 DIAGNOSIS — I10 Essential (primary) hypertension: Secondary | ICD-10-CM | POA: Diagnosis not present

## 2019-08-23 DIAGNOSIS — L988 Other specified disorders of the skin and subcutaneous tissue: Secondary | ICD-10-CM | POA: Diagnosis present

## 2019-08-23 DIAGNOSIS — Z978 Presence of other specified devices: Secondary | ICD-10-CM

## 2019-08-23 DIAGNOSIS — Z800 Family history of malignant neoplasm of digestive organs: Secondary | ICD-10-CM

## 2019-08-23 DIAGNOSIS — Z6841 Body Mass Index (BMI) 40.0 and over, adult: Secondary | ICD-10-CM | POA: Diagnosis not present

## 2019-08-23 DIAGNOSIS — Z8249 Family history of ischemic heart disease and other diseases of the circulatory system: Secondary | ICD-10-CM

## 2019-08-23 DIAGNOSIS — I4581 Long QT syndrome: Secondary | ICD-10-CM | POA: Diagnosis present

## 2019-08-23 DIAGNOSIS — I5042 Chronic combined systolic (congestive) and diastolic (congestive) heart failure: Secondary | ICD-10-CM | POA: Diagnosis present

## 2019-08-23 DIAGNOSIS — E1143 Type 2 diabetes mellitus with diabetic autonomic (poly)neuropathy: Secondary | ICD-10-CM | POA: Diagnosis present

## 2019-08-23 DIAGNOSIS — E662 Morbid (severe) obesity with alveolar hypoventilation: Secondary | ICD-10-CM | POA: Diagnosis present

## 2019-08-23 DIAGNOSIS — Z8041 Family history of malignant neoplasm of ovary: Secondary | ICD-10-CM

## 2019-08-23 DIAGNOSIS — D62 Acute posthemorrhagic anemia: Secondary | ICD-10-CM | POA: Diagnosis present

## 2019-08-23 DIAGNOSIS — Z833 Family history of diabetes mellitus: Secondary | ICD-10-CM

## 2019-08-23 DIAGNOSIS — M3500 Sicca syndrome, unspecified: Secondary | ICD-10-CM | POA: Diagnosis present

## 2019-08-23 DIAGNOSIS — K7689 Other specified diseases of liver: Secondary | ICD-10-CM | POA: Diagnosis present

## 2019-08-23 DIAGNOSIS — E1165 Type 2 diabetes mellitus with hyperglycemia: Secondary | ICD-10-CM

## 2019-08-23 DIAGNOSIS — E119 Type 2 diabetes mellitus without complications: Secondary | ICD-10-CM

## 2019-08-23 DIAGNOSIS — Z9981 Dependence on supplemental oxygen: Secondary | ICD-10-CM | POA: Diagnosis not present

## 2019-08-23 DIAGNOSIS — I69354 Hemiplegia and hemiparesis following cerebral infarction affecting left non-dominant side: Secondary | ICD-10-CM | POA: Diagnosis not present

## 2019-08-23 DIAGNOSIS — Z87891 Personal history of nicotine dependence: Secondary | ICD-10-CM

## 2019-08-23 DIAGNOSIS — I130 Hypertensive heart and chronic kidney disease with heart failure and stage 1 through stage 4 chronic kidney disease, or unspecified chronic kidney disease: Secondary | ICD-10-CM | POA: Diagnosis present

## 2019-08-23 DIAGNOSIS — Z79899 Other long term (current) drug therapy: Secondary | ICD-10-CM

## 2019-08-23 DIAGNOSIS — K219 Gastro-esophageal reflux disease without esophagitis: Secondary | ICD-10-CM | POA: Diagnosis present

## 2019-08-23 DIAGNOSIS — G8929 Other chronic pain: Secondary | ICD-10-CM | POA: Diagnosis present

## 2019-08-23 DIAGNOSIS — E785 Hyperlipidemia, unspecified: Secondary | ICD-10-CM | POA: Diagnosis present

## 2019-08-23 DIAGNOSIS — D134 Benign neoplasm of liver: Secondary | ICD-10-CM | POA: Diagnosis present

## 2019-08-23 DIAGNOSIS — R5381 Other malaise: Secondary | ICD-10-CM | POA: Diagnosis not present

## 2019-08-23 DIAGNOSIS — Z7951 Long term (current) use of inhaled steroids: Secondary | ICD-10-CM

## 2019-08-23 DIAGNOSIS — E1169 Type 2 diabetes mellitus with other specified complication: Secondary | ICD-10-CM | POA: Diagnosis present

## 2019-08-23 DIAGNOSIS — G8918 Other acute postprocedural pain: Principal | ICD-10-CM | POA: Diagnosis present

## 2019-08-23 DIAGNOSIS — E039 Hypothyroidism, unspecified: Secondary | ICD-10-CM | POA: Diagnosis present

## 2019-08-23 DIAGNOSIS — Z794 Long term (current) use of insulin: Secondary | ICD-10-CM

## 2019-08-23 DIAGNOSIS — F419 Anxiety disorder, unspecified: Secondary | ICD-10-CM | POA: Diagnosis present

## 2019-08-23 DIAGNOSIS — Z791 Long term (current) use of non-steroidal anti-inflammatories (NSAID): Secondary | ICD-10-CM

## 2019-08-23 DIAGNOSIS — Z888 Allergy status to other drugs, medicaments and biological substances status: Secondary | ICD-10-CM

## 2019-08-23 DIAGNOSIS — D638 Anemia in other chronic diseases classified elsewhere: Secondary | ICD-10-CM | POA: Diagnosis not present

## 2019-08-23 DIAGNOSIS — R160 Hepatomegaly, not elsewhere classified: Secondary | ICD-10-CM | POA: Diagnosis present

## 2019-08-23 DIAGNOSIS — E1159 Type 2 diabetes mellitus with other circulatory complications: Secondary | ICD-10-CM | POA: Diagnosis present

## 2019-08-23 DIAGNOSIS — Z825 Family history of asthma and other chronic lower respiratory diseases: Secondary | ICD-10-CM

## 2019-08-23 DIAGNOSIS — J45909 Unspecified asthma, uncomplicated: Secondary | ICD-10-CM | POA: Diagnosis present

## 2019-08-23 DIAGNOSIS — Z823 Family history of stroke: Secondary | ICD-10-CM

## 2019-08-23 DIAGNOSIS — Z793 Long term (current) use of hormonal contraceptives: Secondary | ICD-10-CM

## 2019-08-23 DIAGNOSIS — Z803 Family history of malignant neoplasm of breast: Secondary | ICD-10-CM

## 2019-08-23 DIAGNOSIS — Z7952 Long term (current) use of systemic steroids: Secondary | ICD-10-CM

## 2019-08-23 DIAGNOSIS — G4700 Insomnia, unspecified: Secondary | ICD-10-CM | POA: Diagnosis present

## 2019-08-23 DIAGNOSIS — N179 Acute kidney failure, unspecified: Secondary | ICD-10-CM | POA: Diagnosis not present

## 2019-08-23 DIAGNOSIS — Z8261 Family history of arthritis: Secondary | ICD-10-CM

## 2019-08-23 DIAGNOSIS — R238 Other skin changes: Secondary | ICD-10-CM | POA: Diagnosis present

## 2019-08-23 DIAGNOSIS — K625 Hemorrhage of anus and rectum: Secondary | ICD-10-CM | POA: Diagnosis not present

## 2019-08-23 DIAGNOSIS — K661 Hemoperitoneum: Secondary | ICD-10-CM | POA: Diagnosis present

## 2019-08-23 DIAGNOSIS — N189 Chronic kidney disease, unspecified: Secondary | ICD-10-CM | POA: Diagnosis present

## 2019-08-23 DIAGNOSIS — F329 Major depressive disorder, single episode, unspecified: Secondary | ICD-10-CM | POA: Diagnosis present

## 2019-08-23 DIAGNOSIS — K3184 Gastroparesis: Secondary | ICD-10-CM

## 2019-08-23 DIAGNOSIS — G43909 Migraine, unspecified, not intractable, without status migrainosus: Secondary | ICD-10-CM | POA: Diagnosis present

## 2019-08-23 DIAGNOSIS — Z79891 Long term (current) use of opiate analgesic: Secondary | ICD-10-CM

## 2019-08-23 DIAGNOSIS — Z79890 Hormone replacement therapy (postmenopausal): Secondary | ICD-10-CM

## 2019-08-23 DIAGNOSIS — Z885 Allergy status to narcotic agent status: Secondary | ICD-10-CM

## 2019-08-23 DIAGNOSIS — Z8042 Family history of malignant neoplasm of prostate: Secondary | ICD-10-CM

## 2019-08-23 DIAGNOSIS — K0889 Other specified disorders of teeth and supporting structures: Secondary | ICD-10-CM | POA: Diagnosis present

## 2019-08-23 LAB — CBC WITH DIFFERENTIAL/PLATELET
Abs Immature Granulocytes: 0.17 10*3/uL — ABNORMAL HIGH (ref 0.00–0.07)
Basophils Absolute: 0 10*3/uL (ref 0.0–0.1)
Basophils Relative: 0 %
Eosinophils Absolute: 0.2 10*3/uL (ref 0.0–0.5)
Eosinophils Relative: 1 %
HCT: 29.7 % — ABNORMAL LOW (ref 36.0–46.0)
Hemoglobin: 8.9 g/dL — ABNORMAL LOW (ref 12.0–15.0)
Immature Granulocytes: 1 %
Lymphocytes Relative: 10 %
Lymphs Abs: 1.4 10*3/uL (ref 0.7–4.0)
MCH: 28.5 pg (ref 26.0–34.0)
MCHC: 30 g/dL (ref 30.0–36.0)
MCV: 95.2 fL (ref 80.0–100.0)
Monocytes Absolute: 1 10*3/uL (ref 0.1–1.0)
Monocytes Relative: 8 %
Neutro Abs: 10.8 10*3/uL — ABNORMAL HIGH (ref 1.7–7.7)
Neutrophils Relative %: 80 %
Platelets: 310 10*3/uL (ref 150–400)
RBC: 3.12 MIL/uL — ABNORMAL LOW (ref 3.87–5.11)
RDW: 14.2 % (ref 11.5–15.5)
WBC: 13.6 10*3/uL — ABNORMAL HIGH (ref 4.0–10.5)
nRBC: 0.4 % — ABNORMAL HIGH (ref 0.0–0.2)

## 2019-08-23 LAB — PROTIME-INR
INR: 1.2 (ref 0.8–1.2)
Prothrombin Time: 14.9 seconds (ref 11.4–15.2)

## 2019-08-23 LAB — COMPREHENSIVE METABOLIC PANEL
ALT: 86 U/L — ABNORMAL HIGH (ref 0–44)
AST: 85 U/L — ABNORMAL HIGH (ref 15–41)
Albumin: 2.3 g/dL — ABNORMAL LOW (ref 3.5–5.0)
Alkaline Phosphatase: 168 U/L — ABNORMAL HIGH (ref 38–126)
Anion gap: 9 (ref 5–15)
BUN: 12 mg/dL (ref 6–20)
CO2: 27 mmol/L (ref 22–32)
Calcium: 8.7 mg/dL — ABNORMAL LOW (ref 8.9–10.3)
Chloride: 100 mmol/L (ref 98–111)
Creatinine, Ser: 1.04 mg/dL — ABNORMAL HIGH (ref 0.44–1.00)
GFR calc Af Amer: >60 mL/min (ref >60)
GFR calc non Af Amer: >60 mL/min (ref >60)
Glucose, Bld: 186 mg/dL — ABNORMAL HIGH (ref 70–99)
Potassium: 3.8 mmol/L (ref 3.5–5.1)
Sodium: 136 mmol/L (ref 135–145)
Total Bilirubin: 0.9 mg/dL (ref 0.3–1.2)
Total Protein: 5.8 g/dL — ABNORMAL LOW (ref 6.5–8.1)

## 2019-08-23 LAB — GLUCOSE, CAPILLARY: Glucose-Capillary: 197 mg/dL — ABNORMAL HIGH (ref 70–99)

## 2019-08-23 LAB — HEMOGLOBIN A1C
Hgb A1c MFr Bld: 8.6 % — ABNORMAL HIGH (ref 4.8–5.6)
Mean Plasma Glucose: 200.12 mg/dL

## 2019-08-23 MED ORDER — BUPROPION HCL ER (XL) 150 MG PO TB24
300.00 | ORAL_TABLET | ORAL | Status: DC
Start: 2019-08-24 — End: 2019-08-23

## 2019-08-23 MED ORDER — AZATHIOPRINE 50 MG PO TABS
150.0000 mg | ORAL_TABLET | Freq: Two times a day (BID) | ORAL | Status: DC
  Administered 2019-08-24 – 2019-08-26 (×6): 150 mg via ORAL
  Filled 2019-08-23 (×7): qty 3

## 2019-08-23 MED ORDER — TOPIRAMATE 25 MG PO TABS
75.00 | ORAL_TABLET | ORAL | Status: DC
Start: 2019-08-23 — End: 2019-08-23

## 2019-08-23 MED ORDER — DULOXETINE HCL 60 MG PO CPEP
120.00 | ORAL_CAPSULE | ORAL | Status: DC
Start: 2019-08-23 — End: 2019-08-23

## 2019-08-23 MED ORDER — BUPROPION HCL ER (XL) 150 MG PO TB24
300.0000 mg | ORAL_TABLET | Freq: Every day | ORAL | Status: DC
  Administered 2019-08-24 – 2019-08-29 (×6): 300 mg via ORAL
  Filled 2019-08-23 (×6): qty 2

## 2019-08-23 MED ORDER — PRAVASTATIN SODIUM 10 MG PO TABS
10.0000 mg | ORAL_TABLET | Freq: Every day | ORAL | Status: DC
  Administered 2019-08-24 – 2019-08-29 (×6): 10 mg via ORAL
  Filled 2019-08-23 (×6): qty 1

## 2019-08-23 MED ORDER — FAMOTIDINE 20 MG PO TABS
20.0000 mg | ORAL_TABLET | Freq: Two times a day (BID) | ORAL | Status: DC
  Administered 2019-08-24 – 2019-08-29 (×12): 20 mg via ORAL
  Filled 2019-08-23 (×12): qty 1

## 2019-08-23 MED ORDER — NALOXONE HCL 0.4 MG/ML IJ SOLN
0.40 | INTRAMUSCULAR | Status: DC
Start: ? — End: 2019-08-23

## 2019-08-23 MED ORDER — INSULIN ASPART 100 UNIT/ML ~~LOC~~ SOLN
0.0000 [IU] | SUBCUTANEOUS | Status: DC
  Administered 2019-08-24 (×5): 3 [IU] via SUBCUTANEOUS
  Administered 2019-08-24: 09:00:00 2 [IU] via SUBCUTANEOUS
  Administered 2019-08-25: 02:00:00 5 [IU] via SUBCUTANEOUS
  Administered 2019-08-25 – 2019-08-26 (×8): 3 [IU] via SUBCUTANEOUS
  Administered 2019-08-26 (×3): 5 [IU] via SUBCUTANEOUS
  Administered 2019-08-27: 2 [IU] via SUBCUTANEOUS
  Administered 2019-08-27: 8 [IU] via SUBCUTANEOUS
  Administered 2019-08-27: 3 [IU] via SUBCUTANEOUS
  Administered 2019-08-27: 2 [IU] via SUBCUTANEOUS
  Administered 2019-08-27: 01:00:00 3 [IU] via SUBCUTANEOUS
  Administered 2019-08-28: 13:00:00 2 [IU] via SUBCUTANEOUS
  Administered 2019-08-28 (×3): 3 [IU] via SUBCUTANEOUS
  Administered 2019-08-28: 5 [IU] via SUBCUTANEOUS
  Administered 2019-08-29: 2 [IU] via SUBCUTANEOUS
  Administered 2019-08-29: 02:00:00 3 [IU] via SUBCUTANEOUS
  Administered 2019-08-29: 11:00:00 2 [IU] via SUBCUTANEOUS

## 2019-08-23 MED ORDER — PREDNISONE 1 MG PO TABS
3.00 | ORAL_TABLET | ORAL | Status: DC
Start: 2019-08-24 — End: 2019-08-23

## 2019-08-23 MED ORDER — SPIRONOLACTONE 25 MG PO TABS
50.0000 mg | ORAL_TABLET | Freq: Every day | ORAL | Status: DC
  Administered 2019-08-24 – 2019-08-25 (×2): 50 mg via ORAL
  Filled 2019-08-23 (×2): qty 2

## 2019-08-23 MED ORDER — MELATONIN 3 MG PO TABS
3.00 | ORAL_TABLET | ORAL | Status: DC
Start: 2019-08-23 — End: 2019-08-23

## 2019-08-23 MED ORDER — SUCRALFATE 1 GM/10ML PO SUSP
1.0000 g | Freq: Two times a day (BID) | ORAL | Status: DC
  Administered 2019-08-24 – 2019-08-29 (×12): 1 g via ORAL
  Filled 2019-08-23 (×12): qty 10

## 2019-08-23 MED ORDER — CHOLECALCIFEROL 25 MCG (1000 UT) PO TABS
1000.00 | ORAL_TABLET | ORAL | Status: DC
Start: 2019-08-24 — End: 2019-08-23

## 2019-08-23 MED ORDER — HYDROMORPHONE 1 MG/ML IV SOLN
INTRAVENOUS | Status: DC
  Administered 2019-08-24: 1.2 mg via INTRAVENOUS
  Administered 2019-08-24: 30 mg via INTRAVENOUS
  Administered 2019-08-24: 0.4 mg via INTRAVENOUS
  Administered 2019-08-25: 1.6 mg via INTRAVENOUS
  Administered 2019-08-25: 0.8 mg via INTRAVENOUS
  Administered 2019-08-25: 1.2 mg via INTRAVENOUS
  Administered 2019-08-26: 0.4 mg via INTRAVENOUS
  Administered 2019-08-26 (×2): 0.8 mg via INTRAVENOUS
  Filled 2019-08-23: qty 30

## 2019-08-23 MED ORDER — HYDRALAZINE HCL 25 MG PO TABS
25.00 | ORAL_TABLET | ORAL | Status: DC
Start: ? — End: 2019-08-23

## 2019-08-23 MED ORDER — TRAMADOL HCL 50 MG PO TABS: 50.0000 mg | ORAL_TABLET | Freq: Four times a day (QID) | ORAL | Status: DC | PRN

## 2019-08-23 MED ORDER — ONDANSETRON HCL 4 MG/2ML IJ SOLN: 4.0000 mg | Freq: Four times a day (QID) | INTRAMUSCULAR | Status: DC | PRN

## 2019-08-23 MED ORDER — HEPARIN SODIUM (PORCINE) 5000 UNIT/ML IJ SOLN
7500.00 | INTRAMUSCULAR | Status: DC
Start: 2019-08-23 — End: 2019-08-23

## 2019-08-23 MED ORDER — CARVEDILOL 12.5 MG PO TABS
12.50 | ORAL_TABLET | ORAL | Status: DC
Start: 2019-08-23 — End: 2019-08-23

## 2019-08-23 MED ORDER — HYDROMORPHONE HCL 1 MG/ML IJ SOLN
1.0000 mg | INTRAMUSCULAR | Status: DC | PRN
  Administered 2019-08-23: 1 mg via INTRAVENOUS
  Filled 2019-08-23: qty 1

## 2019-08-23 MED ORDER — NALOXONE HCL 0.4 MG/ML IJ SOLN: 0.4000 mg | INTRAMUSCULAR | Status: DC | PRN

## 2019-08-23 MED ORDER — AZATHIOPRINE 50 MG PO TABS
100.00 | ORAL_TABLET | ORAL | Status: DC
Start: 2019-08-24 — End: 2019-08-23

## 2019-08-23 MED ORDER — GENERIC EXTERNAL MEDICATION
75.00 | Status: DC
Start: 2019-08-23 — End: 2019-08-23

## 2019-08-23 MED ORDER — CARVEDILOL 12.5 MG PO TABS
12.5000 mg | ORAL_TABLET | Freq: Two times a day (BID) | ORAL | Status: DC
  Administered 2019-08-24 – 2019-08-29 (×12): 12.5 mg via ORAL
  Filled 2019-08-23 (×12): qty 1

## 2019-08-23 MED ORDER — SODIUM CHLORIDE 0.9% FLUSH: 3.0000 mL | INTRAVENOUS | Status: DC | PRN

## 2019-08-23 MED ORDER — ISOSORB DINITRATE-HYDRALAZINE 20-37.5 MG PO TABS
2.0000 | ORAL_TABLET | Freq: Three times a day (TID) | ORAL | Status: DC
  Administered 2019-08-24 – 2019-08-29 (×18): 2 via ORAL
  Filled 2019-08-23 (×19): qty 2

## 2019-08-23 MED ORDER — SACUBITRIL-VALSARTAN 97-103 MG PO TABS
1.00 | ORAL_TABLET | ORAL | Status: DC
Start: 2019-08-24 — End: 2019-08-23

## 2019-08-23 MED ORDER — ALBUTEROL SULFATE (2.5 MG/3ML) 0.083% IN NEBU: 2.5000 mg | INHALATION_SOLUTION | Freq: Four times a day (QID) | RESPIRATORY_TRACT | Status: DC | PRN

## 2019-08-23 MED ORDER — GENERIC EXTERNAL MEDICATION
1.00 | Status: DC
Start: 2019-08-24 — End: 2019-08-23

## 2019-08-23 MED ORDER — OXYBUTYNIN CHLORIDE ER 15 MG PO TB24
15.0000 mg | ORAL_TABLET | Freq: Every day | ORAL | Status: DC
  Administered 2019-08-24 – 2019-08-29 (×6): 15 mg via ORAL
  Filled 2019-08-23 (×6): qty 1

## 2019-08-23 MED ORDER — FAMOTIDINE 20 MG PO TABS
20.00 | ORAL_TABLET | ORAL | Status: DC
Start: 2019-08-24 — End: 2019-08-23

## 2019-08-23 MED ORDER — SODIUM CHLORIDE 0.9 % IV SOLN: 250.0000 mL | INTRAVENOUS | Status: DC | PRN

## 2019-08-23 MED ORDER — DIPHENHYDRAMINE HCL 12.5 MG/5ML PO ELIX
12.5000 mg | ORAL_SOLUTION | Freq: Four times a day (QID) | ORAL | Status: DC | PRN
  Filled 2019-08-23: qty 5

## 2019-08-23 MED ORDER — PREDNISONE 1 MG PO TABS
3.0000 mg | ORAL_TABLET | Freq: Every day | ORAL | Status: DC
  Administered 2019-08-24 – 2019-08-29 (×6): 3 mg via ORAL
  Filled 2019-08-23 (×6): qty 3

## 2019-08-23 MED ORDER — TOPIRAMATE 25 MG PO TABS
75.0000 mg | ORAL_TABLET | Freq: Every day | ORAL | Status: DC
  Administered 2019-08-24 – 2019-08-28 (×6): 75 mg via ORAL
  Filled 2019-08-23 (×6): qty 3

## 2019-08-23 MED ORDER — SODIUM CHLORIDE 0.9% FLUSH: 9.0000 mL | INTRAVENOUS | Status: DC | PRN

## 2019-08-23 MED ORDER — ONDANSETRON 4 MG PO TBDP
4.00 | ORAL_TABLET | ORAL | Status: DC
Start: ? — End: 2019-08-23

## 2019-08-23 MED ORDER — PREGABALIN 75 MG PO CAPS
75.0000 mg | ORAL_CAPSULE | Freq: Three times a day (TID) | ORAL | Status: DC
  Administered 2019-08-24 – 2019-08-29 (×18): 75 mg via ORAL
  Filled 2019-08-23 (×18): qty 1

## 2019-08-23 MED ORDER — SPIRONOLACTONE 50 MG PO TABS
50.00 | ORAL_TABLET | ORAL | Status: DC
Start: 2019-08-24 — End: 2019-08-23

## 2019-08-23 MED ORDER — AMLODIPINE BESYLATE 5 MG PO TABS
5.00 | ORAL_TABLET | ORAL | Status: DC
Start: 2019-08-24 — End: 2019-08-23

## 2019-08-23 MED ORDER — SENNOSIDES 8.6 MG PO TABS
2.00 | ORAL_TABLET | ORAL | Status: DC
Start: 2019-08-24 — End: 2019-08-23

## 2019-08-23 MED ORDER — DULOXETINE HCL 60 MG PO CPEP
120.0000 mg | ORAL_CAPSULE | Freq: Every day | ORAL | Status: DC
  Administered 2019-08-24 – 2019-08-28 (×6): 120 mg via ORAL
  Filled 2019-08-23 (×6): qty 2

## 2019-08-23 MED ORDER — SODIUM CHLORIDE 0.9 % IV SOLN
INTRAVENOUS | Status: DC
Start: ? — End: 2019-08-23

## 2019-08-23 MED ORDER — IPRATROPIUM-ALBUTEROL 0.5-2.5 (3) MG/3ML IN SOLN
3.00 | RESPIRATORY_TRACT | Status: DC
Start: ? — End: 2019-08-23

## 2019-08-23 MED ORDER — DIPHENHYDRAMINE HCL 50 MG/ML IJ SOLN: 12.5000 mg | Freq: Four times a day (QID) | INTRAMUSCULAR | Status: DC | PRN

## 2019-08-23 MED ORDER — LORATADINE 10 MG PO TABS
10.0000 mg | ORAL_TABLET | Freq: Every day | ORAL | Status: DC
  Administered 2019-08-24 – 2019-08-29 (×6): 10 mg via ORAL
  Filled 2019-08-23 (×6): qty 1

## 2019-08-23 MED ORDER — MOMETASONE FURO-FORMOTEROL FUM 100-5 MCG/ACT IN AERO
2.0000 | INHALATION_SPRAY | Freq: Two times a day (BID) | RESPIRATORY_TRACT | Status: DC
  Administered 2019-08-24 – 2019-08-25 (×3): 2 via RESPIRATORY_TRACT
  Filled 2019-08-23: qty 8.8

## 2019-08-23 MED ORDER — POLYETHYLENE GLYCOL 3350 17 GM/SCOOP PO POWD
17.00 | ORAL | Status: DC
Start: ? — End: 2019-08-23

## 2019-08-23 MED ORDER — INSULIN LISPRO 100 UNIT/ML ~~LOC~~ SOLN
0.00 | SUBCUTANEOUS | Status: DC
Start: 2019-08-24 — End: 2019-08-23

## 2019-08-23 MED ORDER — LIDOCAINE 5 % EX PTCH
2.00 | MEDICATED_PATCH | CUTANEOUS | Status: DC
Start: 2019-08-24 — End: 2019-08-23

## 2019-08-23 MED ORDER — GENERIC EXTERNAL MEDICATION
Status: DC
Start: ? — End: 2019-08-23

## 2019-08-23 MED ORDER — CAMPHOR-MENTHOL 0.5-0.5 % EX LOTN
TOPICAL_LOTION | CUTANEOUS | Status: DC
Start: ? — End: 2019-08-23

## 2019-08-23 MED ORDER — INSULIN GLARGINE 100 UNIT/ML ~~LOC~~ SOLN
35.00 | SUBCUTANEOUS | Status: DC
Start: 2019-08-23 — End: 2019-08-23

## 2019-08-23 MED ORDER — PRAVASTATIN SODIUM 20 MG PO TABS
10.00 | ORAL_TABLET | ORAL | Status: DC
Start: 2019-08-23 — End: 2019-08-23

## 2019-08-23 MED ORDER — HYDROXYCHLOROQUINE SULFATE 200 MG PO TABS
200.00 | ORAL_TABLET | ORAL | Status: DC
Start: 2019-08-24 — End: 2019-08-23

## 2019-08-23 MED ORDER — DICLOFENAC EPOLAMINE 1.3 % EX PTCH
1.00 | MEDICATED_PATCH | CUTANEOUS | Status: DC
Start: 2019-08-23 — End: 2019-08-23

## 2019-08-23 MED ORDER — SACUBITRIL-VALSARTAN 97-103 MG PO TABS
1.0000 | ORAL_TABLET | Freq: Two times a day (BID) | ORAL | Status: DC
  Administered 2019-08-24 – 2019-08-29 (×12): 1 via ORAL
  Filled 2019-08-23 (×13): qty 1

## 2019-08-23 MED ORDER — SODIUM CHLORIDE 0.9% FLUSH
3.0000 mL | Freq: Two times a day (BID) | INTRAVENOUS | Status: DC
  Administered 2019-08-24 – 2019-08-29 (×9): 3 mL via INTRAVENOUS

## 2019-08-23 MED ORDER — ALPRAZOLAM 0.5 MG PO TABS: 0.5000 mg | ORAL_TABLET | Freq: Two times a day (BID) | ORAL | Status: DC | PRN

## 2019-08-23 MED ORDER — ACETAMINOPHEN 325 MG PO TABS: 650.0000 mg | ORAL_TABLET | Freq: Four times a day (QID) | ORAL | Status: DC | PRN

## 2019-08-23 MED ORDER — ISOSORBIDE DINITRATE 20 MG PO TABS
20.00 | ORAL_TABLET | ORAL | Status: DC
Start: 2019-08-23 — End: 2019-08-23

## 2019-08-23 MED ORDER — TORSEMIDE 20 MG PO TABS
20.0000 mg | ORAL_TABLET | Freq: Two times a day (BID) | ORAL | Status: DC
  Administered 2019-08-25 (×2): 20 mg via ORAL
  Filled 2019-08-23 (×7): qty 1

## 2019-08-23 MED ORDER — ONDANSETRON HCL 4 MG PO TABS: 4.0000 mg | ORAL_TABLET | Freq: Four times a day (QID) | ORAL | Status: DC | PRN

## 2019-08-23 MED ORDER — DEXTROSE 50 % IV SOLN
12.50 | INTRAVENOUS | Status: DC
Start: ? — End: 2019-08-23

## 2019-08-23 MED ORDER — AMLODIPINE BESYLATE 5 MG PO TABS
5.0000 mg | ORAL_TABLET | Freq: Every day | ORAL | Status: DC
  Administered 2019-08-24 – 2019-08-29 (×6): 5 mg via ORAL
  Filled 2019-08-23 (×6): qty 1

## 2019-08-23 MED ORDER — ACETAMINOPHEN 650 MG RE SUPP: 650.0000 mg | Freq: Four times a day (QID) | RECTAL | Status: DC | PRN

## 2019-08-23 MED ORDER — LIDOCAINE HCL 1 % IJ SOLN
0.50 | INTRAMUSCULAR | Status: DC
Start: ? — End: 2019-08-23

## 2019-08-23 MED ORDER — HYDROXYCHLOROQUINE SULFATE 200 MG PO TABS
200.0000 mg | ORAL_TABLET | Freq: Two times a day (BID) | ORAL | Status: DC
  Administered 2019-08-24 – 2019-08-29 (×12): 200 mg via ORAL
  Filled 2019-08-23 (×13): qty 1

## 2019-08-23 MED ORDER — GLUCAGON (RDNA) 1 MG IJ KIT
1.00 | PACK | INTRAMUSCULAR | Status: DC
Start: ? — End: 2019-08-23

## 2019-08-23 NOTE — H&P (Addendum)
[name] WNU:[id number] DOB: 30-May-1971 DOA: 08/23/2019     PCP: Bartholome Bill, MD   Outpatient Specialists:   CARDS:  Dr. Einar Gip   GI will need to set up  rheumatology: [facility]; Dr.  Loanne Drilling  Patient arrived to ER on  at   Patient coming from: home Lives with roommate     Chief Complaint: transferred from [place] for completion of care  HPI: [name] is a 49 y.o. female with medical history significant of hepatic mass prior negative by biopsy status post ablation with resolution morbid obesity, lupus, Sjogren's syndrome, Combined systolic/diastolic HF, diabetes mellitus type 2 with gastroparesis, history of CVA, hyperlipidemia, OSA     Presented transferred back to [facility] from [place]. Reports ongoing abdominal pain controled with dilaudid. Reports some chills, a bit short of breath No chest pain. no recent fever. reports today started to have period her last vaginal bleeding was in march 2020   originally admitted to [facility] on 08/12/2019 with 2-day history of nausea vomiting and sudden onset abdominal pain. In Er noted to have DKA and anemia evidence of CHF with CT abd  showed bleeding into the peritoneum by bleeding presumed adenoma. Plavix was held. Initially gently diuresed but then became hypotensive requiring back IV fluids Seen by PCCM and had right IJ central line placed blood pressure is improved DKA has corrected well on insulin drip  GI was consulted hepatology was consulted and she was transferred to [facility] for embolization.  MRI showed multiple nonenhancing large hepatic lesions likely due to hemorrhage in the mass. CT per occult GI bleed protocol was obtained and showed demonstrated 6 large intrahepatic hemorrhage secondary to underlying hepatic mass with small volume hemoperitoneum no evidence of active bleed Cancer markers were obtained and were normal Hepatitis panels was negative and showed no immunity to hepatitis B Her risk for hepatic  adenoma felt to be obesity recent NuvaRing and Megace use Vascular IR was consulted  she had embolization of right lobe adenomas on 08/21/2019   Adenoma bleeding has since resolved according to [facility]. Patient was started on subcu heparin for DVT prophylaxis on 21st since her extravasation was mild and then was changed to dosing of Lovenox 40 every 12 hours for DVT prophylaxis Started PCA for pain control, using ~8m of dilaudid in 24 hours Her PCA settings at the time of discharge was 0.4 mg demand dose lockout 10 minutes 1 mg limit in 1 hour Expect patient to continue to have pain and require PCA for prolonged period time Continue  to monitor hemoglobin Recommend hepatitis B vaccine prior to discharge But they restarted her DVT prophylaxis and wanted uKoreato monitor for any bleeding Patient reporting abdominal pain ever since embolization. [facility] recommended follow-up MRI in 6 months He can follow-up with [facility] hepatology as an outpatient as needed  Time of her discharge her hemoglobin was 10.9 up from initial presentation of 6.9 after transfusion Given oliguria nephrology was consulted, AKI has improved felt to be secondary to hypotension but her Aldactone Entresto and torsemide was held. At the time of transfer from [facility] cr down to 0.9   [facility] 40 [facility] daily  Incidentally her urine showed group B strep she was treated for presumed UTI  Patient has chronic respiratory failure for which she is on 2 L of oxygen which was maintained  [facility]  [facility] consult Recommended applying Mepilex from dressing every 2 to 3 days as needed  She was also found to have prolonged qt  Regarding CHF her BiDil Entresto and torsemide was restarted While spironolactone  Hypertension amlodipine and BiDil and spironolactone was restarted  Infectious risk factors:  Reports  shortness of breath,  abdominal pain,        Lab Results  Component Value Date   [place] NEGATIVE 08/12/2019     Regarding pertinent Chronic problems:     Hyperlipidemia -  on statins Pravachol   HTN on Coreg, Norvasc, bidil   chronic CHF  Systolic  -Last echo showed EF of 45 to 50% At home on Last echo showed EF of 45 to 50%  on entresto and spironolactone   DM 2 -  Lab Results  Component Value Date   HGBA1C 9.0 (H) 08/16/2019   on insulin,      Morbid obesity-   BMI Readings from Last 1 Encounters:  08/24/19 65.03 kg/m      OSA -on nocturnal CPAP   Sjogren's syndrome -on Plaquenil and Imuran, prednison   Story of CVA in 2010 with residual left-sided weakness was treated with aspirin Plavix currently on hold secondary to recent intra-abdominal bleeding Asthma on Dulera  Hx of hepatic mass -  first documented in 01/2013 per care everywhere during a similar presentation for upper abdominal pain with nausea and vomiting. CT A/P on 02/20/13 with a right hepatic lobe mass and hepatomegaly. MRI in 2014 showed the liver " mass " with differential being atypical adenoma/atypical cavernous hemangioma and less likely mets with necrosis "She then had an US guided biosy in 03/2013 at [facility] with initial FNA samples "revealed blood. This suggests that the contents of the lesion is hemorrhagic and it showed adenoma vs FNH".she had embolization done in 2014 and she followed with repeat MRI and CT till 2017 but none after this  Repeat CT abdomen after biopsy In 2014 showed intralesional hemorrhage without definite intraperitoneal bleed. Ultimately underwent embolization of 2 subsegmental arteries of the segment 5 branch of the right hepatic artery. Repeat CT a/p with contrast in 10/2013 showed improvement in the hypodense lesion. At that time measured 2.5cm as compared to 10.6cm previously. In 2017, no mention of hepatic masses were identified.      The following Work up has been ordered so far:  Orders Placed This  Encounter  Procedures  . Comprehensive metabolic panel  . CBC with Differential/Platelet    Following Medications were ordered in ER: Medications  HYDROmorphone (DILAUDID) injection 1 mg (1 mg Intravenous Given 08/23/19 2143)       Significant initial  Findings: Abnormal Labs Reviewed  COMPREHENSIVE METABOLIC PANEL - Abnormal; Notable for the following components:      Result Value   Glucose, Bld 186 (*)    Creatinine, Ser 1.04 (*)    Calcium 8.7 (*)    Total Protein 5.8 (*)    Albumin 2.3 (*)    AST 85 (*)    ALT 86 (*)    Alkaline Phosphatase 168 (*)    All other components within normal limits  CBC WITH DIFFERENTIAL/PLATELET - Abnormal; Notable for the following components:   WBC 13.6 (*)    RBC 3.12 (*)    Hemoglobin 8.9 (*)    HCT 29.7 (*)    nRBC 0.4 (*)    Neutro Abs 10.8 (*)    Abs Immature Granulocytes 0.17 (*)    All other  components within normal limits  HEMOGLOBIN A1C - Abnormal; Notable for the following components:   Hgb A1c MFr Bld 8.6 (*)    All other components within normal limits  GLUCOSE, CAPILLARY - Abnormal; Notable for the following components:   Glucose-Capillary 197 (*)    All other components within normal limits  GLUCOSE, CAPILLARY - Abnormal; Notable for the following components:   Glucose-Capillary 193 (*)    All other components within normal limits    Otherwise labs showing:    Recent Labs  Lab 08/17/19 0545 08/18/19 0415 08/23/19 2244  NA 138 139 136  K 3.4* 3.7 3.8  CO2 27 27 27   GLUCOSE 145* 163* 186*  BUN 16 14 12   CREATININE 1.31* 1.17* 1.04*  CALCIUM 8.4* 8.9 8.7*  PHOS 2.8 2.8  --     Cr   Stable,  Lab Results  Component Value Date   CREATININE 1.04 (H) 08/23/2019   CREATININE 1.17 (H) 08/18/2019   CREATININE 1.31 (H) 08/17/2019    Recent Labs  Lab 08/17/19 0545 08/18/19 0415 08/23/19 2244  AST  --   --  85*  ALT  --   --  86*  ALKPHOS  --   --  168*  BILITOT  --   --  0.9  PROT  --   --  5.8*  ALBUMIN  2.3* 2.4* 2.3*   Lab Results  Component Value Date   CALCIUM 8.7 (L) 08/23/2019   PHOS 2.8 08/18/2019     WBC      Component Value Date/Time   WBC 13.6 (H) 08/23/2019 2244   ANC    Component Value Date/Time   NEUTROABS 10.8 (H) 08/23/2019 2244      Plt: Lab Results  Component Value Date   PLT 310 08/23/2019      COVID-19 Labs  No results for input(s): DDIMER, FERRITIN, LDH, CRP in the last 72 hours.  Lab Results  Component Value Date   SARSCOV2NAA NEGATIVE 08/12/2019      HG/HCT   Down  From [facility] results    Component Value Date/Time   HGB 8.9 (L) 08/23/2019 2244   HCT 29.7 (L) 08/23/2019 2244    No results for input(s): LIPASE, AMYLASE in the last 168 hours. No results for input(s): AMMONIA in the last 168 hours.  No components found for: LABALBU      ECG: Ordered Personally reviewed by me showing: HR : 84 Rhythm: NSR,    no evidence of ischemic changes QTC 399   BNP (last 3 results) Recent Labs    08/12/19 1506  BNP 91.4    ProBNP (last 3 results) No results for input(s): PROBNP in the last 8760 hours.  DM  labs:  HbA1C: Recent Labs    07/18/19 1519 08/16/19 1455 08/23/19 2244  HGBA1C 9.4* 9.0* 8.6*     CBG (last 3)  Recent Labs    08/23/19 2247 08/24/19 0016  GLUCAP 197* 193*       UA   ordered   Enc Vitals Group     BP 130/75     Pulse Rate 82     Resp 18     Temp 98.3 F (36.8 C)     Temp Source Oral     SpO2 100 %     Weight (!) 426 lb 13 oz (193.6 kg)     Height 5' 8"  (1.727 m)     Head Circumference      Peak Flow  Pain Score      Pain Loc      Pain Edu?      Excl. in [place]?   QJJH(41)@       Latest  Blood pressure 130/75, pulse 82, temperature 98.3 F (36.8 C), temperature source Oral, resp. rate 18, height 5' 8"  (1.727 m), weight (!) 193.6 kg, last menstrual period 03/24/2017, SpO2 100 %.     Review of Systems:    Pertinent positives include:  chills, fatigue,  shortness of breath at rest  dyspnea on exertion, decreased appetite Constitutional:  No weight loss, night sweats, Fevers, weight loss  HEENT:  No headaches, Difficulty swallowing,Tooth/dental problems,Sore throat,  No sneezing, itching, ear ache, nasal congestion, post nasal drip,  Cardio-vascular:  No chest pain, Orthopnea, PND, anasarca, dizziness, palpitations.no Bilateral lower extremity swelling  GI:  No heartburn, indigestion, abdominal pain, nausea, vomiting, diarrhea, change in bowel habits, loss of appetite, melena, blood in stool, hematemesis Resp:  no, No excess mucus, no productive cough, No non-productive cough, No coughing up of blood.No change in color of mucus.No wheezing. Skin:  no rash or lesions. No jaundice GU:  no dysuria, change in color of urine, no urgency or frequency. No straining to urinate.  No flank pain.  Musculoskeletal:  No joint pain or no joint swelling. No decreased range of motion. No back pain.  Psych:  No change in mood or affect. No depression or anxiety. No memory loss.  Neuro: no localizing neurological complaints, no tingling, no weakness, no double vision, no gait abnormality, no slurred speech, no confusion  All systems reviewed and apart from [place] all are negative  Past Medical History:   Past Medical History:  Diagnosis Date  . Acute renal failure (HCC)     Intractable nausea vomiting secondary to diabetic gastroparesis causing dehydration and acute renal failure /notes 04/01/2013  . Anginal pain ([place])   . Anxiety   . Bell's palsy 08/09/2010  . CAP (community acquired pneumonia) 10/2011   [facility] Endo 11/08/2011  . Chest pain 01/13/2014  . CHF (congestive heart failure) ([place])   . Diabetic gastroparesis associated with type 2 diabetes mellitus ([place])    this is presumed diagnoses, not confirmed by any studies.   . Eczema   . Family history of malignant neoplasm of breast   . Family history of malignant neoplasm of ovary   . GERD (gastroesophageal reflux disease)   .  High cholesterol   . Hypertension   . Liver lesion 08/13/2019  . Liver mass 2014   biopsied 03/2013 at [facility], not malignant.  is to undergo radiologic ablation of the mass in sept/October 2014.   . Lupus ([place])   . Migraines    "maybe a couple times/yr" (04/01/2013)  . Obesity   . Obstructive sleep apnea on CPAP 2011  . On home oxygen therapy    "2L 24/7" (02/28/2015)  . SJOGREN'S SYNDROME 08/09/2010  . SLE (systemic lupus erythematosus) ([place])    [facility]o 12/01/2010  . Stroke [facility]) 2010; 10/2012   "left side is still weak from it, never fully regained full strength; no additions from stroke 10/2012"      Past Surgical History:  Procedure Laterality Date  . BREAST CYST EXCISION Left 08/2005   epidermoid  . CARDIAC CATHETERIZATION  02/07/12  . ECTOPIC PREGNANCY SURGERY  1999  . ECTOPIC PREGNANCY SURGERY  1999  . ESOPHAGOGASTRODUODENOSCOPY N/A 02/26/2013   Procedure: ESOPHAGOGASTRODUODENOSCOPY (EGD);  Surgeon: Irene Shipper, MD;  Location: [place];  Service:  Endoscopy;  Laterality: N/A;  . ESOPHAGOGASTRODUODENOSCOPY N/A 03/02/2015   Procedure: ESOPHAGOGASTRODUODENOSCOPY (EGD);  Surgeon: Milus Banister, MD;  Location: [place];  Service: Endoscopy;  Laterality: N/A;  . HERNIA REPAIR    . LEFT AND RIGHT HEART CATHETERIZATION WITH CORONARY ANGIOGRAM N/A 02/07/2012   Procedure: LEFT AND RIGHT HEART CATHETERIZATION WITH CORONARY ANGIOGRAM;  Surgeon: Laverda Page, MD;  Location: [facility] CATH LAB;  Service: Cardiovascular;  Laterality: N/A;  . LIVER BIOPSY  03/2013   liver mass/medical hx noted above  . MUSCLE BIOPSY     for lupus/notes 12/01/2010  . UMBILICAL HERNIA REPAIR  1980's    Social History:  Ambulatory   walker        reports that she quit smoking about 28 years ago. Her smoking use included cigarettes. She has a 1.50 pack-year smoking history. She has never used smokeless tobacco. She reports that she does not drink alcohol or use drugs.   Family History:   Family  History  Problem Relation Age of Onset  . Diabetes Mother   . Asthma Mother   . Urticaria Mother   . Hypertension Mother   . Breast cancer Maternal Aunt 39       currently 6  . Stomach cancer Maternal Uncle        dx 14s; deceased  . Prostate cancer Maternal Uncle        deceased 22s  . Cancer Maternal Aunt        unk. primary  . Cancer Maternal Aunt        unk. primary  . Ovarian cancer Maternal Aunt        deceased 47s  . Hypertension Other   . Stroke Other   . Arthritis Other   . Ovarian cancer Sister 23       maternal half-sister; RAD51C positive  . Cancer Paternal Aunt        unk. primary; deceased 73s  . Prostate cancer Paternal Uncle        deceased 59s  . Breast cancer Cousin        deceased 3s; daughter of mat uncle with stomach ca  . Breast cancer Maternal Aunt        deceased 72s  . Asthma Brother   . Asthma Brother   . Allergic rhinitis Neg Hx   . Angioedema Neg Hx     Allergies: Allergies  Allergen Reactions  . Metoclopramide Other (See Comments)    Developed restless leg, akathisia type limb movements.   . Codeine Itching, Rash and Other (See Comments)  . Penicillins Itching    Has patient had a PCN reaction causing immediate rash, facial/tongue/throat swelling, SOB or lightheadedness with hypotension: No Has patient had a PCN reaction causing severe rash involving mucus membranes or skin necrosis: No Has patient had a PCN reaction that required hospitalization: No Has patient had a PCN reaction occurring within the last 10 years:yes If all of the above answers are "NO", then may proceed with Cephalosporin use.  Marland Kitchen Hydrocodone Rash  . Percocet [Oxycodone-Acetaminophen] Hives and Rash    Tolerates dilaudid     Prior to Admission medications   Medication Sig Start Date End Date Taking? Authorizing Provider  Accu-Chek Softclix Lancets lancets Use to monitor glucose levels 4 times per day; E11.9 10/19/18   Renato Shin, MD  albuterol (PROAIR HFA)  108 (90 BASE) MCG/ACT inhaler Inhale 2 puffs into the lungs every 6 (six) hours as needed for wheezing or shortness of  breath.     [provider]  albuterol (PROVENTIL) (2.5 MG/3ML) 0.083% nebulizer solution Take 2.5 mg by nebulization every 6 (six) hours as needed for wheezing.    [provider]  ALPRAZolam Duanne Moron) 0.5 MG tablet Take 0.5 mg 2 (two) times daily as needed by mouth for anxiety or sleep.  04/19/17   [provider]  amLODipine (NORVASC) 5 MG tablet Take 1 tablet (5 mg total) by mouth daily. 10/19/18   Miquel Dunn, NP  Azathioprine 75 MG TABS Take 150 mg by mouth 2 (two) times daily.     [provider]  B-D UF III MINI PEN NEEDLES 31G X 5 MM MISC USE AS DIRECTED TWICE A DAY 10/31/18   Renato Shin, MD  benzonatate (TESSALON) 100 MG capsule Take by mouth continuous as needed for cough.    [provider]  BIDIL 20-37.5 MG tablet TAKE TWO (2) TABLETS BY MOUTH THREE TIMES DAILY  Patient taking differently: Take 2 tablets by mouth 3 (three) times daily.  04/03/19   Miquel Dunn, NP  Blood Glucose Monitoring Suppl (ACCU-CHEK AVIVA) device Use as instructed 10/25/18 10/25/19  Philemon Kingdom, MD  buPROPion (WELLBUTRIN XL) 300 MG 24 hr tablet Take 300 mg by mouth daily. 06/28/19   [provider]  BYETTA 10 MCG PEN 10 MCG/0.04ML SOPN injection INJECT 0.04ML UNDER THE SKIN 2 TIMES DAILY WITH A MEAL Patient taking differently: Inject 10 mcg into the skin 2 (two) times daily with a meal.  08/07/19   Renato Shin, MD  carvedilol (COREG) 25 MG tablet Take 37.5 mg by mouth 2 (two) times daily with a meal.     [provider]  cetirizine (ZYRTEC) 10 MG tablet Take 10 mg by mouth daily as needed for allergies.     [provider]  clotrimazole-betamethasone (LOTRISONE) cream Apply 1 application topically 2 (two) times daily. Patient taking differently: Apply 1 application topically as needed (skin irritation).   01/01/19   Renato Shin, MD  cyanocobalamin (,VITAMIN B-12,) 1000 MCG/ML injection Inject into the muscle. Every other month 12/17/18   [provider]  cycloSPORINE (RESTASIS) 0.05 % ophthalmic emulsion Place 1 drop into both eyes 2 (two) times daily.     [provider]  diclofenac sodium (VOLTAREN) 1 % GEL Apply 2-4 g 4 (four) times daily as needed topically (joint pain). 06/06/17   Geradine Girt, DO  doxepin (SINEQUAN) 50 MG capsule Take 50-100 mg by mouth at bedtime as needed (sleep).     [provider]  DULoxetine (CYMBALTA) 60 MG capsule Take 120 mg by mouth at bedtime.     [provider]  EASY TOUCH PEN NEEDLES 32G X 4 MM MISC USE TO INJECT INSULIN TWICE DAILY 08/07/19   Renato Shin, MD  etonogestrel-ethinyl estradiol (NUVARING) 0.12-0.015 MG/24HR vaginal ring Place 1 each every 28 (twenty-eight) days vaginally. Insert vaginally and leave in place for 3 consecutive weeks, then remove for 1 week.    [provider]  famotidine (PEPCID) 20 MG tablet Take 20 mg by mouth 2 (two) times daily.    [provider]  fluticasone (FLONASE) 50 MCG/ACT nasal spray Place 2 sprays into both nostrils continuous as needed for allergies or rhinitis.    [provider]  glucose blood (ACCU-CHEK AVIVA PLUS) test strip Use to check blood sugar 4 times daily. **Needs appt for refills** 07/09/19   Elayne Snare, MD  hydroxychloroquine (PLAQUENIL) 200 MG tablet Take 200 mg by  mouth 2 (two) times daily.     [provider]  insulin NPH Human (HUMULIN N) 100 UNIT/ML injection Inject 2 mLs (200 Units total) into the skin every morning. And syringes 3/day Patient taking differently: Inject 220 Units into the skin every morning. And syringes 3/day 07/18/19   Renato Shin, MD  Insulin Syringe-Needle U-100 (EASY TOUCH INSULIN SYRINGE) 31G X 5/16" 1 ML MISC USE AS DIRECTED THREE TIMES A DAY 10/12/18   Renato Shin, MD  megestrol (MEGACE) 40 MG tablet Take  40 mg by mouth daily. 01/30/15   [provider]  mometasone-formoterol (DULERA) 100-5 MCG/ACT AERO Inhale 2 puffs into the lungs 2 (two) times daily. 05/29/17   [provider]  oxybutynin (DITROPAN-XL) 10 MG 24 hr tablet Take 15 mg by mouth daily.  08/30/17   [provider]  Polyvinyl Alcohol-Povidone (REFRESH OP) Place 1 drop 2 (two) times daily into both eyes.     [provider]  pravastatin (PRAVACHOL) 10 MG tablet Take 10 mg by mouth daily.    [provider]  predniSONE (DELTASONE) 1 MG tablet Take 3 mg by mouth daily.  04/21/17   [provider]  pregabalin (LYRICA) 75 MG capsule Take 75 mg 3 (three) times daily by mouth.  01/28/15   [provider]  psyllium (METAMUCIL MULTIHEALTH FIBER) 58.6 % powder Take 1 packet by mouth 2 (two) times daily.    [provider]  sucralfate (CARAFATE) 1 GM/10ML suspension Take 1 g by mouth 2 (two) times daily. 07/08/19   [provider]  topiramate (TOPAMAX) 25 MG tablet Take 75 mg by mouth at bedtime. 03/17/16   [provider]  traMADol (ULTRAM) 50 MG tablet TAKE 1 TO 2 TABLETS BY MOUTH EVERY 6 HOURS AS NEEDED FOR PAIN**PA REQ** Patient taking differently: Take 50-100 mg by mouth every 6 (six) hours as needed (pain).  01/21/19   Pete Pelt, PA-C  Vitamin D, Ergocalciferol, (DRISDOL) 1.25 MG (50000 UNIT) CAPS capsule Take 50,000 Units by mouth every 7 (seven) days.    [provider]   Physical Exam: Blood pressure 130/75, pulse 82, temperature 98.3 F (36.8 C), temperature source Oral, resp. rate 18, height 5' 8"  (1.727 m), weight (!) 193.6 kg, last menstrual period 03/24/2017, SpO2 100 %. 1. General:  in No  Acute distress   Chronically ill  -appearing 2. Psychological: Alert and  Oriented 3. Head/ENT:   Moist  Mucous Membranes                          Head Non traumatic, neck supple                           Poor Dentition 4. SKIN:  Normal Skin  turgor,  Skin clean Dry and intact no rash 5. Heart: Regular rate and rhythm no  Murmur, no Rub or gallop 6. Lungs:  distant no wheezes or crackles   7. Abdomen: Soft non-tender,  distended   obese  bowel sounds present 8. Lower extremities: no clubbing, cyanosis, no  edema 9. Neurologically Grossly intact, moving all 4 extremities equally   10. MSK: Normal range of motion   All other LABS:     Recent Labs  Lab 08/16/19 2230 08/17/19 0545 08/18/19 0415  WBC  --  11.7* 12.1*  HGB 7.7* 7.3* 7.6*  HCT 24.5* 23.8* 25.5*  MCV  --  91.9 94.1  PLT  --  157 197     Recent Labs  Lab 08/17/19 0545 08/18/19 0415  NA 138 139  K 3.4* 3.7  CL 104 103  CO2 27 27  GLUCOSE 145* 163*  BUN 16 14  CREATININE 1.31* 1.17*  CALCIUM 8.4* 8.9  PHOS 2.8 2.8     Recent Labs  Lab 08/17/19 0545 08/18/19 0415  ALBUMIN 2.3* 2.4*       Cultures:    Component Value Date/Time   SDES URINE, CLEAN CATCH 08/12/2019 2200   SPECREQUEST NONE 08/12/2019 2200   CULT (A) 08/12/2019 2200    >=100,000 COLONIES/mL GROUP B STREP(S.AGALACTIAE)ISOLATED TESTING AGAINST S. AGALACTIAE NOT ROUTINELY PERFORMED DUE TO PREDICTABILITY OF AMP/PEN/VAN SUSCEPTIBILITY. Performed at [facility], [place] [address], [place], [place] [address]    REPTSTATUS 08/14/2019 FINAL 08/12/2019 2200     Radiological Exams on Admission: No results found.  Chart has been reviewed   Assessment/Plan 49 y.o. female with medical history significant of hepatic mass prior negative by biopsy status post ablation with resolution morbid obesity, lupus, Sjogren's syndrome, Combined systolic/diastolic HF, diabetes mellitus type 2 with gastroparesis, history of CVA, hyperlipidemia, OSA  Admitted for treatment of severe abdominal pain status post embolectomy  Present on Admission: . Liver hemorrhage -secondary to hepatic adenoma status post embolization on the 28th at [facility].  Will need to follow-up as an outpatient with  hematology Continue to follow CBC and clinically for any evidence of rebleeding. Some degree of pain is expected post embolization patient responded well to PCA at [facility] will continue. If CBC continues to drop or patient develops acute change in pain may need to re-image Continue to follow LFTs will need daily CBC and cement Patient was on prophylactic dose of Lovenox prior to transfer but developed vaginal bleeding her hemoglobin has went down some 9.1-8.6 (still within error range) will hold off on Lovenox for tonight and reassess continue to follow hemoglobin Continue to hold Plavix  . SLE -continue home medications prednisone  Recent AKI currently creatinine stabilized around 1.1 we will continue to follow  . HTN (hypertension) -stable resume home medications including Aldactone Amlodipine  . Morbid obesity ([place]) -we will need nutritional evaluation and follow-up as an outpatient  . SJOGREN'S SYNDROME -stable continue home medications including prednisone and Imuran  . Chronic respiratory failure with hypoxia (HCC) -baseline on 2 L continue to monitor currently stable likely secondary to obesity hypoventilation syndrome   . Diabetic gastroparesis ([place]) chronic stable supportive management may worsen with opioids.  Would be aware and attempt to wean off opioids as soon as able  History of CVA holding Plavix, continue statin,  If hemoglobin is stable could restart aspirin  . HLD (hyperlipidemia) -chronic stable continue home medications pravastatin   DM2 - - Order   Moderate  SSI   - continue     Lantus 35 units,  -  check TSH and HgA1C  - Hold by mouth medications    . 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 Hemoperitoneum -continue to monitor, Per [facility] bleeding had stopped  . Acute blood loss anemia -monitor and transfuse as needed  .  Obesity hypoventilation syndrome (HCC) -continue CPAP  . Skin breakdown -order wound care consult     Chronic systolic  chf -continue Entresto Aldactone and restart Demadex Continue to monitor fluid status  and kidney function  History of possible UTI currently asymptomatic but does have somewhat persistent white blood cell count.  Repeated UA/ obtain Urine cult patient had use of Foley catheter during hospitalization treat as needed   Elevated white blood cell count -unclear etiology could be stress related in the setting of recent embolectomy.  If persists or continues to rise consider further work-up at this point afebrile.  History of migraines continue Topamax   Other plan as per orders.  DVT prophylaxis:  SCD    Code Status:  FULL CODE   as per patient  I had personally discussed CODE STATUS with patient    Family Communication:   Family not at  Bedside    Disposition Plan:     likely will need placement for rehabilitation                                             Would benefit from PT/OT eval prior to DC  Ordered                                      Progressive care  consulted                   Nutrition    consulted                  Wound care  consulted                                      Consults called: none   Admission status:  ED Disposition    None         inpatient     Expect 2 midnight stay secondary to severity of patient's current illness including     Severe lab/radiological/exam abnormalities including:   Patient history of intra-abdominal bleeding and extensive comorbidities including:  Chronic pain  DM2   CHF  Morbid Obesity .   liver disease   That are currently affecting medical management.   I expect  patient to be hospitalized for 2 midnights requiring inpatient medical care.  Patient is at high risk for adverse outcome (such as loss of life or disability) if not treated.  Indication for inpatient stay as follows:       severe pain requiring acute inpatient management,     Need for   IV fluids, IV pain medications,      Level of care       SDU tele indefinitely please discontinue once patient no longer qualifies   Precautions: admitted as   Covid Negative  PPE: Used by the provider:   P100  eye Goggles,  Gloves       Clemmie Marxen 08/23/2019, 11:32 PM    [facility]     after 2 AM please page floor coverage PA If 7AM-7PM, please contact the day team taking care of the patient using [email or web address]   Patient was evaluated in the context of the global COVID-19 pandemic, which necessitated consideration that the patient might be at risk for infection with the SARS-CoV-2 virus that causes COVID-19. Institutional protocols and algorithms that pertain to the evaluation of patients at risk for COVID-19 are in a  state of rapid change based on information released by regulatory bodies including the CDC and federal and state organizations. These policies and algorithms were followed during the patient's care.

## 2019-08-24 LAB — CBC
HCT: 29.1 % — ABNORMAL LOW (ref 36.0–46.0)
Hemoglobin: 8.7 g/dL — ABNORMAL LOW (ref 12.0–15.0)
MCH: 28.4 pg (ref 26.0–34.0)
MCHC: 29.9 g/dL — ABNORMAL LOW (ref 30.0–36.0)
MCV: 95.1 fL (ref 80.0–100.0)
Platelets: 323 10*3/uL (ref 150–400)
RBC: 3.06 MIL/uL — ABNORMAL LOW (ref 3.87–5.11)
RDW: 14.3 % (ref 11.5–15.5)
WBC: 14.6 10*3/uL — ABNORMAL HIGH (ref 4.0–10.5)
nRBC: 0.4 % — ABNORMAL HIGH (ref 0.0–0.2)

## 2019-08-24 LAB — GLUCOSE, CAPILLARY
Glucose-Capillary: 142 mg/dL — ABNORMAL HIGH (ref 70–99)
Glucose-Capillary: 154 mg/dL — ABNORMAL HIGH (ref 70–99)
Glucose-Capillary: 173 mg/dL — ABNORMAL HIGH (ref 70–99)
Glucose-Capillary: 179 mg/dL — ABNORMAL HIGH (ref 70–99)
Glucose-Capillary: 187 mg/dL — ABNORMAL HIGH (ref 70–99)
Glucose-Capillary: 193 mg/dL — ABNORMAL HIGH (ref 70–99)

## 2019-08-24 LAB — COMPREHENSIVE METABOLIC PANEL
ALT: 75 U/L — ABNORMAL HIGH (ref 0–44)
AST: 64 U/L — ABNORMAL HIGH (ref 15–41)
Albumin: 2.3 g/dL — ABNORMAL LOW (ref 3.5–5.0)
Alkaline Phosphatase: 165 U/L — ABNORMAL HIGH (ref 38–126)
Anion gap: 7 (ref 5–15)
BUN: 15 mg/dL (ref 6–20)
CO2: 29 mmol/L (ref 22–32)
Calcium: 8.9 mg/dL (ref 8.9–10.3)
Chloride: 102 mmol/L (ref 98–111)
Creatinine, Ser: 0.92 mg/dL (ref 0.44–1.00)
GFR calc Af Amer: >60 mL/min (ref >60)
GFR calc non Af Amer: >60 mL/min (ref >60)
Glucose, Bld: 176 mg/dL — ABNORMAL HIGH (ref 70–99)
Potassium: 4 mmol/L (ref 3.5–5.1)
Sodium: 138 mmol/L (ref 135–145)
Total Bilirubin: 1.1 mg/dL (ref 0.3–1.2)
Total Protein: 5.8 g/dL — ABNORMAL LOW (ref 6.5–8.1)

## 2019-08-24 LAB — URINALYSIS, ROUTINE W REFLEX MICROSCOPIC
Bilirubin Urine: NEGATIVE
Glucose, UA: >=500 mg/dL — AB
Ketones, ur: 5 mg/dL — AB
Nitrite: NEGATIVE
Protein, ur: 100 mg/dL — AB
RBC / HPF: >50 RBC/hpf — ABNORMAL HIGH (ref 0–5)
Specific Gravity, Urine: 1.027 (ref 1.005–1.030)
WBC, UA: >50 WBC/hpf — ABNORMAL HIGH (ref 0–5)
pH: 6 (ref 5.0–8.0)

## 2019-08-24 LAB — MAGNESIUM: Magnesium: 1.8 mg/dL (ref 1.7–2.4)

## 2019-08-24 LAB — PHOSPHORUS: Phosphorus: 3 mg/dL (ref 2.5–4.6)

## 2019-08-24 LAB — TSH: TSH: 7.63 u[IU]/mL — ABNORMAL HIGH (ref 0.350–4.500)

## 2019-08-24 LAB — TYPE AND SCREEN
ABO/RH(D): O POS
Antibody Screen: NEGATIVE

## 2019-08-24 LAB — SARS CORONAVIRUS 2 (TAT 6-24 HRS): SARS Coronavirus 2: NEGATIVE

## 2019-08-24 MED ORDER — ADULT MULTIVITAMIN W/MINERALS CH
1.0000 | ORAL_TABLET | Freq: Every day | ORAL | Status: DC
  Administered 2019-08-24 – 2019-08-29 (×6): 1 via ORAL
  Filled 2019-08-24 (×6): qty 1

## 2019-08-24 MED ORDER — INSULIN GLARGINE 100 UNIT/ML ~~LOC~~ SOLN
35.0000 [IU] | Freq: Every day | SUBCUTANEOUS | Status: DC
  Administered 2019-08-24 – 2019-08-27 (×5): 35 [IU] via SUBCUTANEOUS
  Filled 2019-08-24 (×6): qty 0.35

## 2019-08-24 MED ORDER — ENSURE MAX PROTEIN PO LIQD
11.0000 [oz_av] | Freq: Two times a day (BID) | ORAL | Status: DC
  Administered 2019-08-24 – 2019-08-29 (×10): 11 [oz_av] via ORAL
  Filled 2019-08-24 (×12): qty 330

## 2019-08-24 NOTE — Progress Notes (Addendum)
Initial Nutrition Assessment  RD working remotely.  DOCUMENTATION CODES:   Morbid obesity  INTERVENTION:   -Ensure Max po BID, each supplement provides 150 kcal and 30 grams of protein.  -MVI with minerals daily -RD provided "Heart Healthy, Consistent Carbohydrate Nutrition Therapy" handout from AND's Nutrition Care Manual; handout attached to AVS/ discharge summary   NUTRITION DIAGNOSIS:   Increased nutrient needs related to chronic illness(CHF, DM) as evidenced by estimated needs.  GOAL:   Patient will meet greater than or equal to 90% of their needs  MONITOR:   PO intake, Supplement acceptance, Labs, Weight trends, Skin, I & O's  REASON FOR ASSESSMENT:   Consult, Malnutrition Screening Tool Assessment of nutrition requirement/status  ASSESSMENT:   [name] is a 49 y.o. female with medical history significant of hepatic mass prior negative by biopsy status post ablation with resolution morbid obesity, lupus, Sjogren's syndrome, Combined systolic/diastolic HF, diabetes mellitus type 2 with gastroparesis, history of CVA, hyperlipidemia, OSA  Pt transferred back to [facility] from [place] on 08/22/18 due to prior admission of liver hemorrhage.   1/20- s/p embolization of rt lobe adenomas at [facility]  Pt familiar to this RD, as RD completed prior assessment on 08/15/19 prior to transfer to [place] on 08/18/19. Attempted to speak with pt over phone to obtain additional history, however, no answer.   During last assessment, pt reported that she was trying to make lifestyle changes since her brother passed away this past Thanksgiving, focusing on decreasing processed meat and sodium intake. Her sister prepares her meals and is conscious about baking protein and using Mrs Deliah Boston for seasoning. PTA she was consuming 3 meals per day Breakfast: eggs, grits, and sausage; Lunch: [place] and bologna sandwich; Dinner: fried chicken (which she peels the skin off of) OR meat, starch, and vegetable).    Per previous assessment, pt reports UBW is around 386#. Noted wt gain since last assessment, likely related to edema. Prior nutrition-focused physical exam was difficulty related to moderate edema and may be masking true weight loss as well as fat and muscle depletions.   Albumin has a half-life of 21 days and is strongly affected by stress response and inflammatory process, therefore, do not expect to see an improvement in this lab value during acute hospitalization. When a patient presents with low albumin, it is likely skewed due to the acute inflammatory response.  Unless it is suspected that patient had poor PO intake or malnutrition prior to admission, then RD should not be consulted solely for low albumin. Note that low albumin is no longer used to diagnose malnutrition; [place] uses the new malnutrition guidelines published by the American Society for Parenteral and Enteral Nutrition (A.S.P.E.N.) and the Academy of Nutrition and Dietetics (AND).    Medications reviewed and include prednisone and torsemide.   Lab Results  Component Value Date   HGBA1C 8.6 (H) 08/23/2019  PTA DM medications are 0.4 ml byetta BID, 200 units insulin NPH q AM, and 1000 mg metformin BID. Pt has been followed by endocrinologist Dr. Loanne Drilling since being diagnosed with DM several years ago. DM control has been a constant struggle due to being on prednisone, but typically CBGS range between 130-185. She is motivated to improve glycemic control, as she wants to discontinue insulin.  Labs reviewed: CBGS: 142 (inpatient orders for glycemic control are 0-15 units insulin aspart every 4 hours and 35 units insulin glargine q HS).   Diet Order:   Diet Order  Diet Carb Modified Fluid consistency: Thin; Room service appropriate? Yes  Diet effective now              EDUCATION NEEDS:   No education needs have been identified at this time  Skin:  Skin Assessment: Skin Integrity Issues: Skin Integrity  Issues:: Other (Comment) Other: MASD breasts and groin  Last BM:  08/24/19  Height:   Ht Readings from Last 1 Encounters:  08/23/19 5\' 8"  (1.727 m)    Weight:   Wt Readings from Last 1 Encounters:  08/24/19 (!) 194 kg    Ideal Body Weight:  63.6 kg  BMI:  Body mass index is 65.03 kg/m.  Estimated Nutritional Needs:   Kcal:  2050-2250  Protein:  130-160 grams  Fluid:  2 L    Ryne Mctigue A. Jimmye Norman, RD, LDN, [place] Registered Dietitian II Certified Diabetes Care and Education Specialist Pager: [phone] After hours Pager: [phone]

## 2019-08-24 NOTE — Progress Notes (Signed)
Inpatient Diabetes Program Recommendations  AACE/ADA: New Consensus Statement on Inpatient Glycemic Control (2015)  Target Ranges:  Prepandial:   less than 140 mg/dL      Peak postprandial:   less than 180 mg/dL (1-2 hours)      Critically ill patients:  140 - 180 mg/dL   Lab Results  Component Value Date   GLUCAP 173 (H) 08/24/2019   HGBA1C 8.6 (H) 08/23/2019  Results for [name], [name] (MRN [id number]) as of 08/24/2019 12:32  Ref. Range 08/23/2019 22:47 08/24/2019 00:16 08/24/2019 04:03 08/24/2019 07:24 08/24/2019 11:33  Glucose-Capillary Latest Ref Range: 70 - 99 mg/dL 197 (H) 193 (H) 179 (H) 142 (H) 173 (H)    Review of Glycemic Control  Diabetes history: type 2 Outpatient Diabetes medications: NPH (Humulin N) 200 units every am, Byetta 10 mcg BID Current orders for Inpatient glycemic control: Levemir 35 units at HS, Novolog MODERATE correction scale every 4 hours.  Inpatient Diabetes Program Recommendations:   Received diabetes coordinator consult. Our inpatient diabetes team spoke with patient at last admission on 08/15/19 and 08/16/19.  CBGs seem to be trending well with current insulin orders. Will continue to monitor blood sugars while in the hospital.  Harvel Ricks RN BSN CDE Diabetes Coordinator Pager: [phone]  8am-5pm

## 2019-08-24 NOTE — Discharge Instructions (Signed)

## 2019-08-24 NOTE — Progress Notes (Signed)
[facility]                                                                              Patient Demographics  [name], is a 49 y.o. female, DOB - November 28, 1970, [id number]  Admit date - 08/23/2019   Admitting Physician Toy Baker, MD  Outpatient Primary MD for the patient is Bartholome Bill, MD  Outpatient specialists:   LOS - 1  days    No chief complaint on file.      Brief summary  [name] is a 49 y.o. female with medical history significant of hepatic mass prior negative by biopsy status post ablation with resolution morbid obesity, lupus, Sjogren's syndrome, Combined systolic/diastolic HF, diabetes mellitus type 2 with gastroparesis, history of CVA, hyperlipidemia, OSA.   She is transferred back from [facility] for continued management.  She was originally admitted to [facility] on 08/12/2019 with abdominal pain, nausea vomiting and noted to have bleeding into peritoneum from presumed Hepatic adenoma for which reason she was transferred to [facility] for embolization. She was also treated for DKA with AKI.   Patient has undergone successfully embolization of the hepatic adenoma and is restarted on DVT prophylaxis and knees continue monitoring for any bleeding as well as pain control for abdominal symptoms.  Assessment & Plan    Active Problems:   SLE   SJOGREN'S SYNDROME   HTN (hypertension)   Chronic respiratory failure with hypoxia (HCC)   Morbid obesity (HCC)   Liver masses   Diabetic gastroparesis (HCC)   HLD (hyperlipidemia)   Diabetes (HCC)   Hepatic adenoma   Hemoperitoneum   Acute blood loss anemia   Obesity hypoventilation syndrome (HCC)   Skin breakdown   Essential hypertension   Liver hemorrhage    Hepatic adenoma with  Intraperitoneal hemorrhage  status post embolization at [facility] 08/21/2019  monitor for any bleeding Monitor hematologic indices and transfuse PRBC as needed  continue supportive pain control Plavix on hold Hold Megace/ hormonal agents -Outpatient hepatology follow-up on discharge  Prophylactic Lovenox was held on admission due to vaginal bleeding with associated hemoglobin drop on admission -  Further and marginal drop to 8.7 on 08/24/2019- monitor and consider resumption as needed   recent acute kidney injury  Monitor hematologic indices of 30 lytes Creatinine stable around 1.1   chronic systolic CHF  Continue diuretics monitor electrolytes and renal function with supportive care  Skin breakdown Wound care consulted  Type 2 diabetes mellitus A1c 8.6% on 08/23/2019 Glycemic control with insulin Order oral medications as needed  Hypertension Blood pressure control with antihypertensive regimen    Code Status:  Full code DVT Prophylaxis:   SCD's Family Communication: Discussed in detail with the patient, all imaging results, lab results explained to the patient   Disposition Plan:  To be determined -likely to need  SNF placement  Time Spent in minutes 35 minutes  Procedures:    Consultants:    Antimicrobials:      Medications  Scheduled Meds: . amLODipine  5 mg Oral Daily  . azaTHIOprine  150 mg Oral BID  . buPROPion  300 mg Oral Daily  . carvedilol  12.5 mg Oral BID WC  . DULoxetine  120 mg Oral QHS  . famotidine  20 mg Oral BID  . HYDROmorphone   Intravenous Q4H  . hydroxychloroquine  200 mg Oral BID  . insulin aspart  0-15 Units Subcutaneous Q4H  . insulin glargine  35 Units Subcutaneous QHS  . isosorbide-hydrALAZINE  2 tablet Oral TID  . loratadine  10 mg Oral Daily  . mometasone-formoterol  2 puff Inhalation BID  . multivitamin with minerals  1 tablet Oral Daily  . oxybutynin  15 mg Oral Daily  . pravastatin  10 mg Oral Daily  . predniSONE  3 mg Oral Daily  . pregabalin  75 mg Oral TID  . Ensure Max Protein  11 oz Oral BID  . sacubitril-valsartan  1 tablet Oral BID  . sodium chloride flush  3 mL  Intravenous Q12H  . spironolactone  50 mg Oral Daily  . sucralfate  1 g Oral BID  . topiramate  75 mg Oral QHS  . torsemide  20 mg Oral BID   Continuous Infusions: . sodium chloride     PRN Meds:.sodium chloride, acetaminophen **OR** acetaminophen, albuterol, ALPRAZolam, diphenhydrAMINE **OR** diphenhydrAMINE, HYDROmorphone (DILAUDID) injection, naloxone **AND** sodium chloride flush, ondansetron (ZOFRAN) IV, ondansetron **OR** ondansetron (ZOFRAN) IV, sodium chloride flush, traMADol   Antibiotics   Anti-infectives (From admission, onward)   Start     Dose/Rate Route Frequency Ordered Stop   08/23/19 2345  hydroxychloroquine (PLAQUENIL) tablet 200 mg     200 mg Oral 2 times daily 08/23/19 2341          Subjective:   [name] was seen and examined today.   No acute events reported overnight.  Complains of some nonspecific abdominal pain.  No fever or chills  Objective:   Vitals:   08/24/19 0758 08/24/19 0815 08/24/19 1150 08/24/19 1245  BP:    132/76  Pulse:    82  Resp:  (!) 22 15   Temp:    97.9 F (36.6 C)  TempSrc:    Oral  SpO2: 95% 100% 100% 100%  Weight:      Height:        Intake/Output Summary (Last 24 hours) at 08/24/2019 1433 Last data filed at 08/24/2019 1300 Gross per 24 hour  Intake 488.5 ml  Output --  Net 488.5 ml     Wt Readings from Last 3 Encounters:  08/24/19 (!) 194 kg  08/18/19 (!) 188.9 kg  07/18/19 (!) 193.2 kg     Exam  General: NAD  HEENT: NCAT,  PERRL,MMM  Neck: SUPPLE, (-) JVD  Cardiovascular: RRR, (-) GALLOP, (-) MURMUR  Respiratory: CTA anteriorly  Gastrointestinal: SOFT, (-) DISTENSION, BS(+), (_) TENDERNESS  Ext: (-) CYANOSIS, (-) EDEMA  Neuro: A, OX 3  Skin:(-) RASH  Psych:NORMAL AFFECT/MOOD   Data Reviewed:  I have personally reviewed following labs and imaging studies  Micro Results Recent Results (from the past 240 hour(s))  SARS CORONAVIRUS 2 (TAT 6-24 HRS) Nasopharyngeal Urine, Clean Catch      Status: None   Collection Time: 08/24/19  3:38 AM   Specimen: Urine, Clean Catch; Nasopharyngeal  Result Value Ref Range Status   SARS Coronavirus 2 NEGATIVE NEGATIVE Final    Comment: (NOTE) SARS-CoV-2 target nucleic acids are NOT DETECTED. The SARS-CoV-2 RNA is generally detectable in upper and lower respiratory specimens during the acute phase of infection. Negative results do not preclude SARS-CoV-2 infection, do not  rule out co-infections with other pathogens, and should not be used as the sole basis for treatment or other patient management decisions. Negative results must be combined with clinical observations, patient history, and epidemiological information. The expected result is Negative. Fact Sheet for Patients: SugarRoll.be Fact Sheet for Healthcare Providers: https://www.woods-mathews.com/ This test is not yet approved or cleared by the [place] FDA and  has been authorized for detection and/or diagnosis of SARS-CoV-2 by FDA under an Emergency Use Authorization (EUA). This EUA will remain  in effect (meaning this test can be used) for the duration of the COVID-19 declaration under Section 56 4(b)(1) of the Act, 21 U.S.C. section 360bbb-3(b)(1), unless the authorization is terminated or revoked sooner. Performed at [facility], [place] [address], [place], [place] [address]     Radiology Reports DG Chest 2 View  Result Date: 08/12/2019 CLINICAL DATA:  49 year old female with history of shortness of breath. EXAM: CHEST - 2 VIEW COMPARISON:  Chest x-ray 03/26/2018. FINDINGS: Lung volumes are low. Bibasilar opacities which may reflect areas of atelectasis and/or consolidation. No definite pleural effusions. No evidence of pulmonary edema. Mild cardiomegaly. Upper mediastinal contours are within normal limits. IMPRESSION: 1. Low lung volumes with bibasilar opacities which may be areas of atelectasis and/or consolidation.  Electronically Signed   By: Vinnie Langton M.D.   On: 08/12/2019 16:17   CT Angio Chest PE W and/or Wo Contrast  Result Date: 08/12/2019 CLINICAL DATA:  49 year old female with shortness of breath. Nausea vomiting. Epigastric pain and abnormal abdomen ultrasound earlier today suspicious for hepatic metastatic disease. History of lupus. EXAM: CT ANGIOGRAPHY CHEST CT ABDOMEN AND PELVIS WITH CONTRAST TECHNIQUE: Multidetector CT imaging of the chest was performed using the standard protocol during bolus administration of intravenous contrast. Multiplanar CT image reconstructions and MIPs were obtained to evaluate the vascular anatomy. Multidetector CT imaging of the abdomen and pelvis was performed using the standard protocol during bolus administration of intravenous contrast. CONTRAST:  160 milliliters OMNIPAQUE IOHEXOL 350 MG/ML SOLN, 3mL OMNIPAQUE IOHEXOL 350 MG/ML SOLN COMPARISON:  CTA chest 03/26/2018. CT Abdomen and Pelvis 01/05/2016. FINDINGS: CTA CHEST FINDINGS Cardiovascular: Late but adequate contrast bolus timing in the pulmonary arterial tree. Superimposed respiratory motion and low lung volumes. There is no central or hilar pulmonary artery filling defect. But the bilateral pulmonary artery branches are not well evaluated despite repeated contrast bolus attempt. Negative thoracic aorta. Mild cardiomegaly is stable to improved since 2019. No pericardial effusion. No calcified coronary artery atherosclerosis is evident. Mediastinum/Nodes: Negative.  No mediastinal lymphadenopathy. Lungs/Pleura: Low lung volumes with patent major airways aside from atelectatic changes. Nonspecific scattered and asymmetric bilateral pulmonary ground-glass opacity with confluent peribronchial opacity in the superior segment of the right lower lobe (series 4, image 53). Other bilateral lower lobe mildly confluent peribronchial opacity more resembles atelectasis. No pleural effusion. Musculoskeletal: No acute or suspicious  osseous lesion identified in the chest. Review of the MIP images confirms the above findings. CT ABDOMEN and PELVIS FINDINGS Hepatobiliary: Multiple large at least 6-7 millimeter areas of abnormally decreased and heterogeneous liver enhancement in the right lobe are poorly marginated and masslike. The right lobe appears mildly expanded compared to 2017. The left lobe may be spared. The gallbladder is grossly normal. There is a small volume of intermediate density free fluid adjacent to the liver compatible with hemoperitoneum. Pancreas: Negative. Spleen: Diminutive and negative. Adrenals/Urinary Tract: Adrenal glands appear normal. No hydronephrosis. Bilateral renal enhancement is symmetric and within normal limits but on  delayed images there is minimal contrast excretion from both kidneys. The proximal ureters are decompressed. Diminutive and unremarkable urinary bladder. There is a small 2 millimeter upper pole right renal calculus which is new from 2017. Stomach/Bowel: Largely decompressed and negative large bowel from the splenic flexure distally. There may be mild sigmoid diverticulosis. Redundant proximal transverse colon with mild retained stool. Similar mild retained stool in the right colon. No large bowel inflammation is evident. Terminal ileum appears negative. Normal appendix suspected on coronal image 72. No dilated small bowel. Mildly fluid distended but otherwise negative stomach. No free air. Free fluid with intermediate density mostly around the liver and in both gutters, small volume overall. Vascular/Lymphatic: Arterial phase images of the abdomen and pelvis were included on the chest study. Abdominal aorta, its branches, iliac and proximal femoral arteries appear patent and within normal limits. On these images the portal venous system is not well evaluated. The main portal vein and SMV appear diminutive. Portal vein was also not well visualized on ultrasound earlier today. No lymphadenopathy.  Reproductive: Stable and negative allowing for adjacent hemoperitoneum. Other: Intermediate density pelvic free fluid such as due to hemoperitoneum (series 9, image 82). Volume is moderate and both lower quadrants are also affected. Musculoskeletal: Lower lumbar facet degeneration. No destructive or suspicious osseous lesion identified. Review of the MIP images confirms the above findings. IMPRESSION: 1. Suboptimal CTA chest for PE with no central or hilar pulmonary embolus. If there is strong clinical suspicion a pulmonary V/Q scan would be complementary to evaluate the segmental and distal branches. 2. Highly abnormal liver with multifocal infiltrative mass-like areas of abnormal enhancement in the right lobe and associated small volume of Hemoperitoneum in the abdomen and pelvis. Top differential considerations include hemorrhagic Hepatocellular Carcinoma or Hepatic Adenomas. These were not evident on a 2017 CT Abdomen and Pelvis. 3. Pulmonary atelectasis and additional nonspecific ground-glass opacity. Acute pulmonary infection or aspiration difficult to exclude. No pleural effusion. 4. Little renal contrast excretion on the delayed images, but no obstructive uropathy. Consider ATN or other intrinsic acute renal failure. 5. Cardiomegaly is stable or mildly improved since 2019. Study discussed by telephone with Dr. Dorie Rank on 08/12/2019 at 20:50 . Electronically Signed   By: Genevie Ann M.D.   On: 08/12/2019 20:56   CT ABDOMEN PELVIS W CONTRAST  Result Date: 08/12/2019 CLINICAL DATA:  49 year old female with shortness of breath. Nausea vomiting. Epigastric pain and abnormal abdomen ultrasound earlier today suspicious for hepatic metastatic disease. History of lupus. EXAM: CT ANGIOGRAPHY CHEST CT ABDOMEN AND PELVIS WITH CONTRAST TECHNIQUE: Multidetector CT imaging of the chest was performed using the standard protocol during bolus administration of intravenous contrast. Multiplanar CT image reconstructions and  MIPs were obtained to evaluate the vascular anatomy. Multidetector CT imaging of the abdomen and pelvis was performed using the standard protocol during bolus administration of intravenous contrast. CONTRAST:  160 milliliters OMNIPAQUE IOHEXOL 350 MG/ML SOLN, 76mL OMNIPAQUE IOHEXOL 350 MG/ML SOLN COMPARISON:  CTA chest 03/26/2018. CT Abdomen and Pelvis 01/05/2016. FINDINGS: CTA CHEST FINDINGS Cardiovascular: Late but adequate contrast bolus timing in the pulmonary arterial tree. Superimposed respiratory motion and low lung volumes. There is no central or hilar pulmonary artery filling defect. But the bilateral pulmonary artery branches are not well evaluated despite repeated contrast bolus attempt. Negative thoracic aorta. Mild cardiomegaly is stable to improved since 2019. No pericardial effusion. No calcified coronary artery atherosclerosis is evident. Mediastinum/Nodes: Negative.  No mediastinal lymphadenopathy. Lungs/Pleura: Low lung volumes with patent major  airways aside from atelectatic changes. Nonspecific scattered and asymmetric bilateral pulmonary ground-glass opacity with confluent peribronchial opacity in the superior segment of the right lower lobe (series 4, image 53). Other bilateral lower lobe mildly confluent peribronchial opacity more resembles atelectasis. No pleural effusion. Musculoskeletal: No acute or suspicious osseous lesion identified in the chest. Review of the MIP images confirms the above findings. CT ABDOMEN and PELVIS FINDINGS Hepatobiliary: Multiple large at least 6-7 millimeter areas of abnormally decreased and heterogeneous liver enhancement in the right lobe are poorly marginated and masslike. The right lobe appears mildly expanded compared to 2017. The left lobe may be spared. The gallbladder is grossly normal. There is a small volume of intermediate density free fluid adjacent to the liver compatible with hemoperitoneum. Pancreas: Negative. Spleen: Diminutive and negative.  Adrenals/Urinary Tract: Adrenal glands appear normal. No hydronephrosis. Bilateral renal enhancement is symmetric and within normal limits but on delayed images there is minimal contrast excretion from both kidneys. The proximal ureters are decompressed. Diminutive and unremarkable urinary bladder. There is a small 2 millimeter upper pole right renal calculus which is new from 2017. Stomach/Bowel: Largely decompressed and negative large bowel from the splenic flexure distally. There may be mild sigmoid diverticulosis. Redundant proximal transverse colon with mild retained stool. Similar mild retained stool in the right colon. No large bowel inflammation is evident. Terminal ileum appears negative. Normal appendix suspected on coronal image 72. No dilated small bowel. Mildly fluid distended but otherwise negative stomach. No free air. Free fluid with intermediate density mostly around the liver and in both gutters, small volume overall. Vascular/Lymphatic: Arterial phase images of the abdomen and pelvis were included on the chest study. Abdominal aorta, its branches, iliac and proximal femoral arteries appear patent and within normal limits. On these images the portal venous system is not well evaluated. The main portal vein and SMV appear diminutive. Portal vein was also not well visualized on ultrasound earlier today. No lymphadenopathy. Reproductive: Stable and negative allowing for adjacent hemoperitoneum. Other: Intermediate density pelvic free fluid such as due to hemoperitoneum (series 9, image 82). Volume is moderate and both lower quadrants are also affected. Musculoskeletal: Lower lumbar facet degeneration. No destructive or suspicious osseous lesion identified. Review of the MIP images confirms the above findings. IMPRESSION: 1. Suboptimal CTA chest for PE with no central or hilar pulmonary embolus. If there is strong clinical suspicion a pulmonary V/Q scan would be complementary to evaluate the segmental  and distal branches. 2. Highly abnormal liver with multifocal infiltrative mass-like areas of abnormal enhancement in the right lobe and associated small volume of Hemoperitoneum in the abdomen and pelvis. Top differential considerations include hemorrhagic Hepatocellular Carcinoma or Hepatic Adenomas. These were not evident on a 2017 CT Abdomen and Pelvis. 3. Pulmonary atelectasis and additional nonspecific ground-glass opacity. Acute pulmonary infection or aspiration difficult to exclude. No pleural effusion. 4. Little renal contrast excretion on the delayed images, but no obstructive uropathy. Consider ATN or other intrinsic acute renal failure. 5. Cardiomegaly is stable or mildly improved since 2019. Study discussed by telephone with Dr. Dorie Rank on 08/12/2019 at 20:50 . Electronically Signed   By: Genevie Ann M.D.   On: 08/12/2019 20:56   DG CHEST PORT 1 VIEW  Result Date: 08/14/2019 CLINICAL DATA:  Shortness of breath EXAM: PORTABLE CHEST 1 VIEW COMPARISON:  Yesterday FINDINGS: Cardiomegaly and vascular pedicle widening. Congested appearance of vessels which may have progressed. Right IJ line with tip at the SVC. No visible effusion or pneumothorax.  IMPRESSION: Cardiomegaly with vascular congestion that may have progressed from yesterday. Electronically Signed   By: Monte Fantasia M.D.   On: 08/14/2019 05:17   DG Chest Portable 1 View  Result Date: 08/13/2019 CLINICAL DATA:  Post central line placement. EXAM: PORTABLE CHEST 1 VIEW COMPARISON:  Chest radiograph 08/12/2019 FINDINGS: Interval placement of a right IJ approach central venous catheter with tip projecting in the region of the superior vena cava. Overlying cardiac monitoring leads. Unchanged mild cardiomegaly. Shallow inspiration radiograph. Persistent bibasilar opacities which may reflect atelectasis or pneumonia. No definite pleural effusion or evidence of pneumothorax. IMPRESSION: Interval placement of a right IJ approach central venous  catheter with tip projecting in the region of the superior vena cava. Persistent low lung volumes and bibasilar opacities which may reflect atelectasis and/or pneumonia. Electronically Signed   By: Kellie Simmering DO   On: 08/13/2019 09:17   US Abdomen Limited RUQ  Result Date: 08/12/2019 CLINICAL DATA:  Epigastric pain. EXAM: ULTRASOUND ABDOMEN LIMITED RIGHT UPPER QUADRANT COMPARISON:  Abdominopelvic CT 01/05/2016. Abdominal ultrasound 02/28/2015. FINDINGS: Gallbladder: No gallstones or wall thickening visualized. No sonographic Murphy sign noted by sonographer. Common bile duct: Diameter: Not visualized.  No biliary dilatation identified. Liver: The liver is very heterogeneous in echotexture. There are probable ill-defined masses within the left lobe. A predominately hypoechoic mass measures approximately 10.2 x 9.1 x 8.5 cm. There is a more echogenic infiltrative mass in the left lobe measuring 6.9 x 5.6 x 8.7 cm. These lesions were not present previously. The portal vein is not well visualized. Other: Small amount of ascites. IMPRESSION: 1. New diffuse heterogeneity of the hepatic parenchyma with ill-defined masses in the left hepatic lobe, suspicious for malignancy. Further evaluation with abdominopelvic CT with contrast recommended. 2. The portal vein and common bile duct are not well visualized. No biliary dilatation identified. 3. The gallbladder appears normal. Electronically Signed   By: Richardean Sale M.D.   On: 08/12/2019 16:17    Lab Data:  CBC: Recent Labs  Lab 08/18/19 0415 08/23/19 2244 08/24/19 0538  WBC 12.1* 13.6* 14.6*  NEUTROABS  --  10.8*  --   HGB 7.6* 8.9* 8.7*  HCT 25.5* 29.7* 29.1*  MCV 94.1 95.2 95.1  PLT 197 310 [id number]   Basic Metabolic Panel: Recent Labs  Lab 08/18/19 0415 08/23/19 2244 08/24/19 0538  NA 139 136 138  K 3.7 3.8 4.0  CL 103 100 102  CO2 27 27 29   GLUCOSE 163* 186* 176*  BUN 14 12 15   CREATININE 1.17* 1.04* 0.92  CALCIUM 8.9 8.7* 8.9  MG  --    --  1.8  PHOS 2.8  --  3.0   GFR: Estimated Creatinine Clearance: 136.8 mL/min (by C-G formula based on SCr of 0.92 mg/dL). Liver Function Tests: Recent Labs  Lab 08/18/19 0415 08/23/19 2244 08/24/19 0538  AST  --  85* 64*  ALT  --  86* 75*  ALKPHOS  --  168* 165*  BILITOT  --  0.9 1.1  PROT  --  5.8* 5.8*  ALBUMIN 2.4* 2.3* 2.3*   No results for input(s): LIPASE, AMYLASE in the last 168 hours. No results for input(s): AMMONIA in the last 168 hours. Coagulation Profile: Recent Labs  Lab 08/23/19 2244  INR 1.2   Cardiac Enzymes: No results for input(s): CKTOTAL, CKMB, CKMBINDEX, TROPONINI in the last 168 hours. BNP (last 3 results) No results for input(s): PROBNP in the last 8760 hours. HbA1C: Recent Labs  08/23/19 2244  HGBA1C 8.6*   CBG: Recent Labs  Lab 08/23/19 2247 08/24/19 0016 08/24/19 0403 08/24/19 0724 08/24/19 1133  GLUCAP 197* 193* 179* 142* 173*   Lipid Profile: No results for input(s): CHOL, HDL, LDLCALC, TRIG, CHOLHDL, LDLDIRECT in the last 72 hours. Thyroid Function Tests: Recent Labs    08/24/19 0538  TSH 7.630*   Anemia Panel: No results for input(s): VITAMINB12, FOLATE, FERRITIN, TIBC, IRON, RETICCTPCT in the last 72 hours. Urine analysis:    Component Value Date/Time   COLORURINE AMBER (A) 08/24/2019 0427   APPEARANCEUR CLEAR 08/24/2019 0427   LABSPEC 1.027 08/24/2019 0427   PHURINE 6.0 08/24/2019 0427   GLUCOSEU >=500 (A) 08/24/2019 0427   HGBUR LARGE (A) 08/24/2019 0427   BILIRUBINUR NEGATIVE 08/24/2019 0427   KETONESUR 5 (A) 08/24/2019 0427   PROTEINUR 100 (A) 08/24/2019 0427   UROBILINOGEN 0.2 02/27/2015 1733   NITRITE NEGATIVE 08/24/2019 0427   LEUKOCYTESUR MODERATE (A) 08/24/2019 0427     Benito Mccreedy M.D. [facility] 08/24/2019, 2:33 PM  Pager: [phone] Between 7am to 7pm - call Pager - [phone]  After 7pm go to [email or web address] - password [email or web address]  Call night coverage person covering after 7pm

## 2019-08-24 NOTE — Evaluation (Addendum)
Physical Therapy Evaluation Patient Details Name: [name] MRN: [id number] DOB: 02-15-1971 Today's Date: 08/24/2019   History of Present Illness  Pt is a 49 y/o female transferred from [facility] for completion of care. Pt has a PMH including but not limited to hepatic mass prior negative by biopsy status post ablation with resolution morbid obesity, lupus, Sjogren's syndrome, Combined systolic/diastolic HF, diabetes mellitus type 2 with gastroparesis, history of CVA, hyperlipidemia, OSA  Admitted for treatment of severe abdominal pain status post embolectomy.    Clinical Impression  Pt presented supine in bed with HOB elevated, awake and willing to participate in therapy session. Prior to admission, pt reported that she ambulated with use of a cane intermittently. Pt stated that she lives with a roommate but that her sister lives close by and can assist as needed. At the time of evaluation, pt significantly limited with functional mobility secondary to pain, fatigue and weakness. She required heavy physical assistance of two for bed mobility and total A for pericare after having a BM on bed pan. Pt would continue to benefit from skilled physical therapy services at this time while admitted and after d/c to address the below listed limitations in order to improve overall safety and independence with functional mobility.     Follow Up Recommendations CIR    Equipment Recommendations  None recommended by PT    Recommendations for Other Services       Precautions / Restrictions Precautions Precautions: Fall Precaution Comments: PCA pump Restrictions Weight Bearing Restrictions: No      Mobility  Bed Mobility Overal bed mobility: Needs Assistance Bed Mobility: Rolling Rolling: Mod assist;+2 for physical assistance         General bed mobility comments: pt rolling bilaterally in bed with mod A x2 and total A for pericare after having BM on bed pan  Transfers                  General transfer comment: deferred secondary to fatigue  Ambulation/Gait                Stairs            Wheelchair Mobility    Modified Rankin (Stroke Patients Only)       Balance                                             Pertinent Vitals/Pain Pain Assessment: Faces Faces Pain Scale: Hurts whole lot Pain Location: abdomen, back Pain Descriptors / Indicators: Discomfort;Sore Pain Intervention(s): Monitored during session;Repositioned    Home Living Family/patient expects to be discharged to:: Private residence Living Arrangements: Non-relatives/Friends Available Help at Discharge: Family;Available PRN/intermittently Type of Home: House Home Access: Stairs to enter Entrance Stairs-Rails: Left Entrance Stairs-Number of Steps: 8 Home Layout: One level Home Equipment: Shower seat;Hand held shower head;Bedside commode;Cane - single point;Walker - 4 wheels      Prior Function Level of Independence: Independent with assistive device(s)         Comments: ambulates with a cane intermittently     Hand Dominance        Extremity/Trunk Assessment   Upper Extremity Assessment Upper Extremity Assessment: Generalized weakness    Lower Extremity Assessment Lower Extremity Assessment: Generalized weakness       Communication   Communication: No difficulties  Cognition Arousal/Alertness: Awake/alert Behavior During Therapy: Flat affect;WFL  for tasks assessed/performed Overall Cognitive Status: Difficult to assess                                 General Comments: Pt with short responses, periods of closing eyes and pt-reported fatigue      General Comments      Exercises     Assessment/Plan    PT Assessment Patient needs continued PT services  PT Problem List Decreased strength;Decreased range of motion;Decreased activity tolerance;Decreased balance;Decreased mobility;Decreased coordination;Decreased  knowledge of use of DME;Decreased knowledge of precautions;Decreased safety awareness;Pain       PT Treatment Interventions DME instruction;Gait training;Stair training;Functional mobility training;Therapeutic activities;Therapeutic exercise;Balance training;Neuromuscular re-education;Patient/family education    PT Goals (Current goals can be found in the Care Plan section)  Acute Rehab PT Goals Patient Stated Goal: decrease pain, return to independence PT Goal Formulation: With patient Time For Goal Achievement: 09/07/19 Potential to Achieve Goals: Fair    Frequency Min 3X/week   Barriers to discharge Decreased caregiver support      Co-evaluation               AM-PAC PT "6 Clicks" Mobility  Outcome Measure Help needed turning from your back to your side while in a flat bed without using bedrails?: A Lot Help needed moving from lying on your back to sitting on the side of a flat bed without using bedrails?: A Lot Help needed moving to and from a bed to a chair (including a wheelchair)?: A Lot Help needed standing up from a chair using your arms (e.g., wheelchair or bedside chair)?: A Lot Help needed to walk in hospital room?: Total Help needed climbing 3-5 steps with a railing? : Total 6 Click Score: 10    End of Session   Activity Tolerance: Patient limited by fatigue;Patient limited by pain Patient left: in bed;with call bell/phone within reach;with bed alarm set Nurse Communication: Mobility status PT Visit Diagnosis: Other abnormalities of gait and mobility (R26.89);Muscle weakness (generalized) (M62.81)    Time: [id number] PT Time Calculation (min) (ACUTE ONLY): 16 min   Charges:   PT Evaluation $PT Eval Moderate Complexity: 1 Mod          Eduard Clos, PT, DPT  Acute Rehabilitation Services Pager [phone] Office [place] 08/24/2019, 3:53 PM

## 2019-08-24 NOTE — Progress Notes (Signed)
Rehab Admissions Coordinator Note:  Patient was screened by Michel Santee for appropriateness for an Inpatient Acute Rehab Consult.  At this time, pt is not demonstrating the ability to tolerate 3 hrs of therapy /day.  I will follow through her next therapy session for tolerance.   Michel Santee 08/24/2019, 4:15 PM  I can be reached at [phone].

## 2019-08-24 NOTE — Plan of Care (Signed)

## 2019-08-25 ENCOUNTER — Encounter (HOSPITAL_COMMUNITY): Payer: Self-pay | Admitting: Internal Medicine

## 2019-08-25 ENCOUNTER — Other Ambulatory Visit: Payer: Self-pay

## 2019-08-25 LAB — CBC
HCT: 28.7 % — ABNORMAL LOW (ref 36.0–46.0)
HCT: 27.7 % — ABNORMAL LOW (ref 36.0–46.0)
Hemoglobin: 8.6 g/dL — ABNORMAL LOW (ref 12.0–15.0)
Hemoglobin: 8.5 g/dL — ABNORMAL LOW (ref 12.0–15.0)
MCH: 28.3 pg (ref 26.0–34.0)
MCH: 28.9 pg (ref 26.0–34.0)
MCHC: 30 g/dL (ref 30.0–36.0)
MCHC: 30.7 g/dL (ref 30.0–36.0)
MCV: 94.4 fL (ref 80.0–100.0)
MCV: 94.2 fL (ref 80.0–100.0)
Platelets: 317 10*3/uL (ref 150–400)
Platelets: 308 10*3/uL (ref 150–400)
RBC: 3.04 MIL/uL — ABNORMAL LOW (ref 3.87–5.11)
RBC: 2.94 MIL/uL — ABNORMAL LOW (ref 3.87–5.11)
RDW: 14.5 % (ref 11.5–15.5)
RDW: 14.5 % (ref 11.5–15.5)
WBC: 11.6 10*3/uL — ABNORMAL HIGH (ref 4.0–10.5)
WBC: 12.8 10*3/uL — ABNORMAL HIGH (ref 4.0–10.5)
nRBC: 0.4 % — ABNORMAL HIGH (ref 0.0–0.2)
nRBC: 0.2 % (ref 0.0–0.2)

## 2019-08-25 LAB — GLUCOSE, CAPILLARY
Glucose-Capillary: 246 mg/dL — ABNORMAL HIGH (ref 70–99)
Glucose-Capillary: 184 mg/dL — ABNORMAL HIGH (ref 70–99)
Glucose-Capillary: 157 mg/dL — ABNORMAL HIGH (ref 70–99)
Glucose-Capillary: 178 mg/dL — ABNORMAL HIGH (ref 70–99)
Glucose-Capillary: 179 mg/dL — ABNORMAL HIGH (ref 70–99)
Glucose-Capillary: 190 mg/dL — ABNORMAL HIGH (ref 70–99)

## 2019-08-25 LAB — URINE CULTURE

## 2019-08-25 MED ORDER — LEVOTHYROXINE SODIUM 50 MCG PO TABS
50.0000 ug | ORAL_TABLET | Freq: Every day | ORAL | Status: DC
  Administered 2019-08-25 – 2019-08-29 (×5): 50 ug via ORAL
  Filled 2019-08-25 (×5): qty 1

## 2019-08-25 NOTE — Plan of Care (Signed)

## 2019-08-25 NOTE — Evaluation (Signed)
Occupational Therapy Evaluation Patient Details Name: [name] MRN: [id number] DOB: 1971/07/21 Today's Date: 08/25/2019    History of Present Illness Pt is a 49 y/o female transferred from [facility] for completion of care. Pt has a PMH including but not limited to hepatic mass prior negative by biopsy status post ablation with resolution morbid obesity, lupus, Sjogren's syndrome, Combined systolic/diastolic HF, diabetes mellitus type 2 with gastroparesis, history of CVA, hyperlipidemia, OSA  Admitted for treatment of severe abdominal pain status post embolectomy.   Clinical Impression   PTA pt living at home with roommate, independent for BADL with use of cane intermittently. Pt with recent admission and transfer back from [facility] after sx. At time of eval she presents with abdominal pain, generalized weakness, and decreased BADL endurance. She is able to complete bed mobility at max A level. Pt then performed x2 sit <> stands by using back of recliner for support. DOE 3/4 noted with minimal activity, PCA used during session. Given her current status, recommend CIR for intensive therapy to promote independent PLOF. Will continue to follow per POC listed below.     Follow Up Recommendations  CIR;Supervision/Assistance - 24 hour    Equipment Recommendations  3 in 1 bedside commode;Wheelchair (measurements OT);Wheelchair cushion (measurements OT);Other (comment)(bariatric)    Recommendations for Other Services       Precautions / Restrictions Precautions Precautions: Fall Precaution Comments: PCA pump Restrictions Weight Bearing Restrictions: No      Mobility Bed Mobility Overal bed mobility: Needs Assistance       Supine to sit: Max assist Sit to supine: Max assist   General bed mobility comments: max A to sit EOB, assist at legs toward edge as well as at trunk to achieve sitting  Transfers Overall transfer level: Needs assistance Equipment used: Ambulation equipment  used Transfers: Sit to/from Stand Sit to Stand: Max assist         General transfer comment: max A to stand x2 by pulling up from back of recliner due to lack of bariatric walker during session    Balance Overall balance assessment: Needs assistance Sitting-balance support: Feet supported;No upper extremity supported Sitting balance-Leahy Scale: Fair     Standing balance support: Bilateral upper extremity supported;During functional activity Standing balance-Leahy Scale: Poor Standing balance comment: reliant on external support                           ADL either performed or assessed with clinical judgement   ADL Overall ADL's : Needs assistance/impaired Eating/Feeding: Set up;Sitting   Grooming: Set up;Sitting   Upper Body Bathing: Sitting;Moderate assistance   Lower Body Bathing: Maximal assistance;Sit to/from stand   Upper Body Dressing : Moderate assistance;Sitting   Lower Body Dressing: Maximal assistance;Sit to/from stand;Sitting/lateral leans   Toilet Transfer: Maximal assistance;+2 for physical assistance;+2 for safety/equipment;Stand-pivot Toilet Transfer Details (indicate cue type and reason): max A for sit <> stand only this date Toileting- Clothing Manipulation and Hygiene: Maximal assistance;Sit to/from stand       Functional mobility during ADLs: Maximal assistance(max A to pull up on back of recliner for sit <> stand only) General ADL Comments: pt limited by decreased activity tolerance and generalized weakness for BADL     Vision Patient Visual Report: No change from baseline       Perception     Praxis      Pertinent Vitals/Pain Pain Assessment: Faces Faces Pain Scale: Hurts little more Pain Location: abdomen, back  Pain Descriptors / Indicators: Discomfort;Sore Pain Intervention(s): Monitored during session;Repositioned     Hand Dominance Right   Extremity/Trunk Assessment Upper Extremity Assessment Upper Extremity  Assessment: Generalized weakness   Lower Extremity Assessment Lower Extremity Assessment: Generalized weakness       Communication Communication Communication: No difficulties   Cognition Arousal/Alertness: Awake/alert Behavior During Therapy: Flat affect;WFL for tasks assessed/performed Overall Cognitive Status: Within Functional Limits for tasks assessed                                 General Comments: pt more responsive and alert this date   General Comments       Exercises     Shoulder Instructions      Home Living Family/patient expects to be discharged to:: Private residence Living Arrangements: Non-relatives/Friends Available Help at Discharge: Family;Available PRN/intermittently Type of Home: House Home Access: Stairs to enter [employer] of Steps: 8 Entrance Stairs-Rails: Left Home Layout: One level     Bathroom Shower/Tub: Teacher, early years/pre: Handicapped height     Home Equipment: Shower seat;Hand held shower head;Bedside commode;Cane - single point;Walker - 4 wheels          Prior Functioning/Environment Level of Independence: Independent with assistive device(s)        Comments: ambulates with a cane intermittently; independent with BADL        OT Problem List: Decreased strength;Decreased knowledge of use of DME or AE;Decreased activity tolerance;Cardiopulmonary status limiting activity;Impaired balance (sitting and/or standing);Pain;Obesity      OT Treatment/Interventions: Self-care/ADL training;Therapeutic exercise;Patient/family education;Balance training;Energy conservation;Therapeutic activities;DME and/or AE instruction    OT Goals(Current goals can be found in the care plan section) Acute Rehab OT Goals Patient Stated Goal: decrease pain, return to independence OT Goal Formulation: With patient Time For Goal Achievement: 09/08/19 Potential to Achieve Goals: Good  OT Frequency: Min 2X/week    Barriers to D/C:            Co-evaluation              AM-PAC OT "6 Clicks" Daily Activity     Outcome Measure Help from another person eating meals?: A Little Help from another person taking care of personal grooming?: A Little Help from another person toileting, which includes using toliet, bedpan, or urinal?: A Lot Help from another person bathing (including washing, rinsing, drying)?: A Lot Help from another person to put on and taking off regular upper body clothing?: A Lot Help from another person to put on and taking off regular lower body clothing?: A Lot 6 Click Score: 14   End of Session Equipment Utilized During Treatment: Oxygen Nurse Communication: Mobility status  Activity Tolerance: Patient tolerated treatment well Patient left: in bed;with call bell/phone within reach;with bed alarm set  OT Visit Diagnosis: Unsteadiness on feet (R26.81);Other abnormalities of gait and mobility (R26.89);Muscle weakness (generalized) (M62.81);Pain Pain - part of body: (abdomen)                Time: [id number] OT Time Calculation (min): 24 min Charges:  OT General Charges $OT Visit: 1 Visit OT Evaluation $OT Eval Moderate Complexity: 1 Mod OT Treatments $Self Care/Home Management : 8-22 mins  Zenovia Jarred, MSOT, OTR/L Acute Rehabilitation Services [facility] Office Number: [phone]  Zenovia Jarred 08/25/2019, 10:57 AM

## 2019-08-25 NOTE — Progress Notes (Signed)
[facility]                                                                              Patient Demographics  [name], is a 49 y.o. female, DOB - Aug 01, 1971, [id number]  Admit date - 08/23/2019   Admitting Physician Toy Baker, MD  Outpatient Primary MD for the patient is Bartholome Bill, MD  Outpatient specialists:   LOS - 2  days    No chief complaint on file.      Brief summary  [name] is a 49 y.o. female with medical history significant of hepatic mass prior negative by biopsy status post ablation with resolution morbid obesity, lupus, Sjogren's syndrome, Combined systolic/diastolic HF, diabetes mellitus type 2 with gastroparesis, history of CVA, hyperlipidemia, OSA.   She is transferred back from [facility] for continued management.  She was originally admitted to [facility] on 08/12/2019 with abdominal pain, nausea vomiting and noted to have bleeding into peritoneum from presumed Hepatic adenoma for which reason she was transferred to [facility] for embolization. She was also treated for DKA with AKI.   Patient has undergone successfully embolization of the hepatic adenoma and is restarted on DVT prophylaxis and knees continue monitoring for any bleeding as well as pain control for abdominal symptoms.  Assessment & Plan    Active Problems:   SLE   SJOGREN'S SYNDROME   HTN (hypertension)   Chronic respiratory failure with hypoxia (HCC)   Morbid obesity (HCC)   Liver masses   Diabetic gastroparesis (HCC)   HLD (hyperlipidemia)   Diabetes (HCC)   Hepatic adenoma   Hemoperitoneum   Acute blood loss anemia   Obesity hypoventilation syndrome (HCC)   Skin breakdown   Essential hypertension   Liver hemorrhage    Hepatic adenoma with  Intraperitoneal hemorrhage  status post embolization at [facility] 08/21/2019  monitor for any bleeding Monitor hematologic indices and transfuse PRBC as needed  continue supportive pain control Plavix on hold Hold Megace/ hormonal agents -Outpatient hepatology follow-up on discharge  Prophylactic Lovenox was held on admission due to vaginal bleeding with associated hemoglobin drop on admission -  Further and marginal drop to 8.7 on 08/24/2019- monitor and consider resumption as needed   recent acute kidney injury  Monitor hematologic indices of 30 lytes Creatinine stable around 1.1   chronic systolic CHF  Continue diuretics monitor electrolytes and renal function with supportive care  Skin breakdown Wound care consulted   Diarrhea C diff ordered with contact precaution   Hypothyroidism Thyroid replacement with TSH in 3 weeks Type 2 diabetes mellitus A1c 8.6% on 08/23/2019 Glycemic control with insulin Order oral medications as needed  Hypertension Blood pressure control with antihypertensive regimen    Code Status:  Full code DVT Prophylaxis:   SCD's Family Communication: Discussed in detail with the patient, all imaging results, lab results explained to the patient   Disposition Plan:   Possible CIR  Time Spent in minutes 25 minutes  Procedures:    Consultants:    Antimicrobials:      Medications  Scheduled Meds:  amLODipine  5 mg Oral Daily  azaTHIOprine  150 mg Oral BID   buPROPion  300 mg Oral Daily   carvedilol  12.5 mg Oral BID WC   DULoxetine  120 mg Oral QHS   famotidine  20 mg Oral BID   HYDROmorphone   Intravenous Q4H   hydroxychloroquine  200 mg Oral BID   insulin aspart  0-15 Units Subcutaneous Q4H   insulin glargine  35 Units Subcutaneous QHS   isosorbide-hydrALAZINE  2 tablet Oral TID   loratadine  10 mg Oral Daily   mometasone-formoterol  2 puff Inhalation BID   multivitamin with minerals  1 tablet Oral Daily   oxybutynin  15 mg Oral Daily   pravastatin  10 mg Oral Daily   predniSONE  3 mg Oral Daily   pregabalin  75 mg Oral TID   Ensure Max Protein  11 oz Oral BID    sacubitril-valsartan  1 tablet Oral BID   sodium chloride flush  3 mL Intravenous Q12H   spironolactone  50 mg Oral Daily   sucralfate  1 g Oral BID   topiramate  75 mg Oral QHS   torsemide  20 mg Oral BID   Continuous Infusions:  sodium chloride     PRN Meds:.sodium chloride, acetaminophen **OR** acetaminophen, albuterol, ALPRAZolam, diphenhydrAMINE **OR** diphenhydrAMINE, HYDROmorphone (DILAUDID) injection, naloxone **AND** sodium chloride flush, ondansetron (ZOFRAN) IV, ondansetron **OR** ondansetron (ZOFRAN) IV, sodium chloride flush, traMADol   Antibiotics   Anti-infectives (From admission, onward)   Start     Dose/Rate Route Frequency Ordered Stop   08/23/19 2345  hydroxychloroquine (PLAQUENIL) tablet 200 mg     200 mg Oral 2 times daily 08/23/19 2341          Subjective:   [name] was seen and examined today.   No new complaint  Objective:   Vitals:   08/25/19 0600 08/25/19 0726 08/25/19 0800 08/25/19 0824  BP: 116/64 132/71    Pulse: 77 83    Resp: 20 16 16    Temp: 98.1 F (36.7 C) 98.6 F (37 C)    TempSrc: Oral Oral    SpO2: 97% 100% 100% 98%  Weight:      Height:        Intake/Output Summary (Last 24 hours) at 08/25/2019 0945 Last data filed at 08/25/2019 [id number] Gross per 24 hour  Intake 700 ml  Output 200 ml  Net 500 ml     Wt Readings from Last 3 Encounters:  08/25/19 (!) 193.8 kg  08/18/19 (!) 188.9 kg  07/18/19 (!) 193.2 kg     Exam  General: NAD  HEENT: NCAT,  PERRL,MMM  Neck: SUPPLE, (-) JVD  Cardiovascular: RRR, (-) GALLOP, (-) MURMUR  Respiratory: CTA anteriorly  Gastrointestinal: SOFT, (-) DISTENSION, BS(+), (_) TENDERNESS  Ext: (-) CYANOSIS, (-) EDEMA  Neuro: A, OX 3  Skin:(-) RASH  Psych:NORMAL AFFECT/MOOD   Data Reviewed:  I have personally reviewed following labs and imaging studies  Micro Results Recent Results (from the past 240 hour(s))  SARS CORONAVIRUS 2 (TAT 6-24 HRS) Nasopharyngeal Urine, Clean  Catch     Status: None   Collection Time: 08/24/19  3:38 AM   Specimen: Urine, Clean Catch; Nasopharyngeal  Result Value Ref Range Status   SARS Coronavirus 2 NEGATIVE NEGATIVE Final    Comment: (NOTE) SARS-CoV-2 target nucleic acids are NOT DETECTED. The SARS-CoV-2 RNA is generally detectable in upper and lower respiratory specimens during the acute phase of infection. Negative results do not preclude SARS-CoV-2 infection, do not rule  out co-infections with other pathogens, and should not be used as the sole basis for treatment or other patient management decisions. Negative results must be combined with clinical observations, patient history, and epidemiological information. The expected result is Negative. Fact Sheet for Patients: SugarRoll.be Fact Sheet for Healthcare Providers: https://www.woods-mathews.com/ This test is not yet approved or cleared by the [place] FDA and  has been authorized for detection and/or diagnosis of SARS-CoV-2 by FDA under an Emergency Use Authorization (EUA). This EUA will remain  in effect (meaning this test can be used) for the duration of the COVID-19 declaration under Section 56 4(b)(1) of the Act, 21 U.S.C. section 360bbb-3(b)(1), unless the authorization is terminated or revoked sooner. Performed at [facility], [place] [address] [place], [place] [address]   Culture, Urine     Status: Abnormal   Collection Time: 08/24/19  4:21 AM   Specimen: Urine, Clean Catch  Result Value Ref Range Status   Specimen Description URINE, CLEAN CATCH  Final   Special Requests   Final    NONE Performed at [facility], [employer] [address], [place], [place] [address]    Culture MULTIPLE SPECIES PRESENT, SUGGEST RECOLLECTION (A)  Final   Report Status 08/25/2019 FINAL  Final    Radiology Reports DG Chest 2 View  Result Date: 08/12/2019 CLINICAL DATA:  49 year old female with history of shortness of  breath. EXAM: CHEST - 2 VIEW COMPARISON:  Chest x-ray 03/26/2018. FINDINGS: Lung volumes are low. Bibasilar opacities which may reflect areas of atelectasis and/or consolidation. No definite pleural effusions. No evidence of pulmonary edema. Mild cardiomegaly. Upper mediastinal contours are within normal limits. IMPRESSION: 1. Low lung volumes with bibasilar opacities which may be areas of atelectasis and/or consolidation. Electronically Signed   By: Vinnie Langton M.D.   On: 08/12/2019 16:17   CT Angio Chest PE W and/or Wo Contrast  Result Date: 08/12/2019 CLINICAL DATA:  49 year old female with shortness of breath. Nausea vomiting. Epigastric pain and abnormal abdomen ultrasound earlier today suspicious for hepatic metastatic disease. History of lupus. EXAM: CT ANGIOGRAPHY CHEST CT ABDOMEN AND PELVIS WITH CONTRAST TECHNIQUE: Multidetector CT imaging of the chest was performed using the standard protocol during bolus administration of intravenous contrast. Multiplanar CT image reconstructions and MIPs were obtained to evaluate the vascular anatomy. Multidetector CT imaging of the abdomen and pelvis was performed using the standard protocol during bolus administration of intravenous contrast. CONTRAST:  160 milliliters OMNIPAQUE IOHEXOL 350 MG/ML SOLN, 39mL OMNIPAQUE IOHEXOL 350 MG/ML SOLN COMPARISON:  CTA chest 03/26/2018. CT Abdomen and Pelvis 01/05/2016. FINDINGS: CTA CHEST FINDINGS Cardiovascular: Late but adequate contrast bolus timing in the pulmonary arterial tree. Superimposed respiratory motion and low lung volumes. There is no central or hilar pulmonary artery filling defect. But the bilateral pulmonary artery branches are not well evaluated despite repeated contrast bolus attempt. Negative thoracic aorta. Mild cardiomegaly is stable to improved since 2019. No pericardial effusion. No calcified coronary artery atherosclerosis is evident. Mediastinum/Nodes: Negative.  No mediastinal lymphadenopathy.  Lungs/Pleura: Low lung volumes with patent major airways aside from atelectatic changes. Nonspecific scattered and asymmetric bilateral pulmonary ground-glass opacity with confluent peribronchial opacity in the superior segment of the right lower lobe (series 4, image 53). Other bilateral lower lobe mildly confluent peribronchial opacity more resembles atelectasis. No pleural effusion. Musculoskeletal: No acute or suspicious osseous lesion identified in the chest. Review of the MIP images confirms the above findings. CT ABDOMEN and PELVIS FINDINGS Hepatobiliary: Multiple large at least  6-7 millimeter areas of abnormally decreased and heterogeneous liver enhancement in the right lobe are poorly marginated and masslike. The right lobe appears mildly expanded compared to 2017. The left lobe may be spared. The gallbladder is grossly normal. There is a small volume of intermediate density free fluid adjacent to the liver compatible with hemoperitoneum. Pancreas: Negative. Spleen: Diminutive and negative. Adrenals/Urinary Tract: Adrenal glands appear normal. No hydronephrosis. Bilateral renal enhancement is symmetric and within normal limits but on delayed images there is minimal contrast excretion from both kidneys. The proximal ureters are decompressed. Diminutive and unremarkable urinary bladder. There is a small 2 millimeter upper pole right renal calculus which is new from 2017. Stomach/Bowel: Largely decompressed and negative large bowel from the splenic flexure distally. There may be mild sigmoid diverticulosis. Redundant proximal transverse colon with mild retained stool. Similar mild retained stool in the right colon. No large bowel inflammation is evident. Terminal ileum appears negative. Normal appendix suspected on coronal image 72. No dilated small bowel. Mildly fluid distended but otherwise negative stomach. No free air. Free fluid with intermediate density mostly around the liver and in both gutters, small  volume overall. Vascular/Lymphatic: Arterial phase images of the abdomen and pelvis were included on the chest study. Abdominal aorta, its branches, iliac and proximal femoral arteries appear patent and within normal limits. On these images the portal venous system is not well evaluated. The main portal vein and SMV appear diminutive. Portal vein was also not well visualized on ultrasound earlier today. No lymphadenopathy. Reproductive: Stable and negative allowing for adjacent hemoperitoneum. Other: Intermediate density pelvic free fluid such as due to hemoperitoneum (series 9, image 82). Volume is moderate and both lower quadrants are also affected. Musculoskeletal: Lower lumbar facet degeneration. No destructive or suspicious osseous lesion identified. Review of the MIP images confirms the above findings. IMPRESSION: 1. Suboptimal CTA chest for PE with no central or hilar pulmonary embolus. If there is strong clinical suspicion a pulmonary V/Q scan would be complementary to evaluate the segmental and distal branches. 2. Highly abnormal liver with multifocal infiltrative mass-like areas of abnormal enhancement in the right lobe and associated small volume of Hemoperitoneum in the abdomen and pelvis. Top differential considerations include hemorrhagic Hepatocellular Carcinoma or Hepatic Adenomas. These were not evident on a 2017 CT Abdomen and Pelvis. 3. Pulmonary atelectasis and additional nonspecific ground-glass opacity. Acute pulmonary infection or aspiration difficult to exclude. No pleural effusion. 4. Little renal contrast excretion on the delayed images, but no obstructive uropathy. Consider ATN or other intrinsic acute renal failure. 5. Cardiomegaly is stable or mildly improved since 2019. Study discussed by telephone with Dr. Dorie Rank on 08/12/2019 at 20:50 . Electronically Signed   By: Genevie Ann M.D.   On: 08/12/2019 20:56   CT ABDOMEN PELVIS W CONTRAST  Result Date: 08/12/2019 CLINICAL DATA:   49 year old female with shortness of breath. Nausea vomiting. Epigastric pain and abnormal abdomen ultrasound earlier today suspicious for hepatic metastatic disease. History of lupus. EXAM: CT ANGIOGRAPHY CHEST CT ABDOMEN AND PELVIS WITH CONTRAST TECHNIQUE: Multidetector CT imaging of the chest was performed using the standard protocol during bolus administration of intravenous contrast. Multiplanar CT image reconstructions and MIPs were obtained to evaluate the vascular anatomy. Multidetector CT imaging of the abdomen and pelvis was performed using the standard protocol during bolus administration of intravenous contrast. CONTRAST:  160 milliliters OMNIPAQUE IOHEXOL 350 MG/ML SOLN, 27mL OMNIPAQUE IOHEXOL 350 MG/ML SOLN COMPARISON:  CTA chest 03/26/2018. CT Abdomen and Pelvis 01/05/2016. FINDINGS:  CTA CHEST FINDINGS Cardiovascular: Late but adequate contrast bolus timing in the pulmonary arterial tree. Superimposed respiratory motion and low lung volumes. There is no central or hilar pulmonary artery filling defect. But the bilateral pulmonary artery branches are not well evaluated despite repeated contrast bolus attempt. Negative thoracic aorta. Mild cardiomegaly is stable to improved since 2019. No pericardial effusion. No calcified coronary artery atherosclerosis is evident. Mediastinum/Nodes: Negative.  No mediastinal lymphadenopathy. Lungs/Pleura: Low lung volumes with patent major airways aside from atelectatic changes. Nonspecific scattered and asymmetric bilateral pulmonary ground-glass opacity with confluent peribronchial opacity in the superior segment of the right lower lobe (series 4, image 53). Other bilateral lower lobe mildly confluent peribronchial opacity more resembles atelectasis. No pleural effusion. Musculoskeletal: No acute or suspicious osseous lesion identified in the chest. Review of the MIP images confirms the above findings. CT ABDOMEN and PELVIS FINDINGS Hepatobiliary: Multiple large at  least 6-7 millimeter areas of abnormally decreased and heterogeneous liver enhancement in the right lobe are poorly marginated and masslike. The right lobe appears mildly expanded compared to 2017. The left lobe may be spared. The gallbladder is grossly normal. There is a small volume of intermediate density free fluid adjacent to the liver compatible with hemoperitoneum. Pancreas: Negative. Spleen: Diminutive and negative. Adrenals/Urinary Tract: Adrenal glands appear normal. No hydronephrosis. Bilateral renal enhancement is symmetric and within normal limits but on delayed images there is minimal contrast excretion from both kidneys. The proximal ureters are decompressed. Diminutive and unremarkable urinary bladder. There is a small 2 millimeter upper pole right renal calculus which is new from 2017. Stomach/Bowel: Largely decompressed and negative large bowel from the splenic flexure distally. There may be mild sigmoid diverticulosis. Redundant proximal transverse colon with mild retained stool. Similar mild retained stool in the right colon. No large bowel inflammation is evident. Terminal ileum appears negative. Normal appendix suspected on coronal image 72. No dilated small bowel. Mildly fluid distended but otherwise negative stomach. No free air. Free fluid with intermediate density mostly around the liver and in both gutters, small volume overall. Vascular/Lymphatic: Arterial phase images of the abdomen and pelvis were included on the chest study. Abdominal aorta, its branches, iliac and proximal femoral arteries appear patent and within normal limits. On these images the portal venous system is not well evaluated. The main portal vein and SMV appear diminutive. Portal vein was also not well visualized on ultrasound earlier today. No lymphadenopathy. Reproductive: Stable and negative allowing for adjacent hemoperitoneum. Other: Intermediate density pelvic free fluid such as due to hemoperitoneum (series 9,  image 82). Volume is moderate and both lower quadrants are also affected. Musculoskeletal: Lower lumbar facet degeneration. No destructive or suspicious osseous lesion identified. Review of the MIP images confirms the above findings. IMPRESSION: 1. Suboptimal CTA chest for PE with no central or hilar pulmonary embolus. If there is strong clinical suspicion a pulmonary V/Q scan would be complementary to evaluate the segmental and distal branches. 2. Highly abnormal liver with multifocal infiltrative mass-like areas of abnormal enhancement in the right lobe and associated small volume of Hemoperitoneum in the abdomen and pelvis. Top differential considerations include hemorrhagic Hepatocellular Carcinoma or Hepatic Adenomas. These were not evident on a 2017 CT Abdomen and Pelvis. 3. Pulmonary atelectasis and additional nonspecific ground-glass opacity. Acute pulmonary infection or aspiration difficult to exclude. No pleural effusion. 4. Little renal contrast excretion on the delayed images, but no obstructive uropathy. Consider ATN or other intrinsic acute renal failure. 5. Cardiomegaly is stable or mildly improved  since 2019. Study discussed by telephone with Dr. Dorie Rank on 08/12/2019 at 20:50 . Electronically Signed   By: Genevie Ann M.D.   On: 08/12/2019 20:56   DG CHEST PORT 1 VIEW  Result Date: 08/14/2019 CLINICAL DATA:  Shortness of breath EXAM: PORTABLE CHEST 1 VIEW COMPARISON:  Yesterday FINDINGS: Cardiomegaly and vascular pedicle widening. Congested appearance of vessels which may have progressed. Right IJ line with tip at the SVC. No visible effusion or pneumothorax. IMPRESSION: Cardiomegaly with vascular congestion that may have progressed from yesterday. Electronically Signed   By: Monte Fantasia M.D.   On: 08/14/2019 05:17   DG Chest Portable 1 View  Result Date: 08/13/2019 CLINICAL DATA:  Post central line placement. EXAM: PORTABLE CHEST 1 VIEW COMPARISON:  Chest radiograph 08/12/2019 FINDINGS:  Interval placement of a right IJ approach central venous catheter with tip projecting in the region of the superior vena cava. Overlying cardiac monitoring leads. Unchanged mild cardiomegaly. Shallow inspiration radiograph. Persistent bibasilar opacities which may reflect atelectasis or pneumonia. No definite pleural effusion or evidence of pneumothorax. IMPRESSION: Interval placement of a right IJ approach central venous catheter with tip projecting in the region of the superior vena cava. Persistent low lung volumes and bibasilar opacities which may reflect atelectasis and/or pneumonia. Electronically Signed   By: Kellie Simmering DO   On: 08/13/2019 09:17   US Abdomen Limited RUQ  Result Date: 08/12/2019 CLINICAL DATA:  Epigastric pain. EXAM: ULTRASOUND ABDOMEN LIMITED RIGHT UPPER QUADRANT COMPARISON:  Abdominopelvic CT 01/05/2016. Abdominal ultrasound 02/28/2015. FINDINGS: Gallbladder: No gallstones or wall thickening visualized. No sonographic Murphy sign noted by sonographer. Common bile duct: Diameter: Not visualized.  No biliary dilatation identified. Liver: The liver is very heterogeneous in echotexture. There are probable ill-defined masses within the left lobe. A predominately hypoechoic mass measures approximately 10.2 x 9.1 x 8.5 cm. There is a more echogenic infiltrative mass in the left lobe measuring 6.9 x 5.6 x 8.7 cm. These lesions were not present previously. The portal vein is not well visualized. Other: Small amount of ascites. IMPRESSION: 1. New diffuse heterogeneity of the hepatic parenchyma with ill-defined masses in the left hepatic lobe, suspicious for malignancy. Further evaluation with abdominopelvic CT with contrast recommended. 2. The portal vein and common bile duct are not well visualized. No biliary dilatation identified. 3. The gallbladder appears normal. Electronically Signed   By: Richardean Sale M.D.   On: 08/12/2019 16:17    Lab Data:  CBC: Recent Labs  Lab 08/23/19 2244  08/24/19 0538  WBC 13.6* 14.6*  NEUTROABS 10.8*  --   HGB 8.9* 8.7*  HCT 29.7* 29.1*  MCV 95.2 95.1  PLT 310 [id number]   Basic Metabolic Panel: Recent Labs  Lab 08/23/19 2244 08/24/19 0538  NA 136 138  K 3.8 4.0  CL 100 102  CO2 27 29  GLUCOSE 186* 176*  BUN 12 15  CREATININE 1.04* 0.92  CALCIUM 8.7* 8.9  MG  --  1.8  PHOS  --  3.0   GFR: Estimated Creatinine Clearance: 136.8 mL/min (by C-G formula based on SCr of 0.92 mg/dL). Liver Function Tests: Recent Labs  Lab 08/23/19 2244 08/24/19 0538  AST 85* 64*  ALT 86* 75*  ALKPHOS 168* 165*  BILITOT 0.9 1.1  PROT 5.8* 5.8*  ALBUMIN 2.3* 2.3*   No results for input(s): LIPASE, AMYLASE in the last 168 hours. No results for input(s): AMMONIA in the last 168 hours. Coagulation Profile: Recent Labs  Lab 08/23/19 2244  INR 1.2   Cardiac Enzymes: No results for input(s): CKTOTAL, CKMB, CKMBINDEX, TROPONINI in the last 168 hours. BNP (last 3 results) No results for input(s): PROBNP in the last 8760 hours. HbA1C: Recent Labs    08/23/19 2244  HGBA1C 8.6*   CBG: Recent Labs  Lab 08/24/19 1633 08/24/19 2032 08/25/19 0040 08/25/19 0505 08/25/19 0737  GLUCAP 187* 154* 246* 184* 157*   Lipid Profile: No results for input(s): CHOL, HDL, LDLCALC, TRIG, CHOLHDL, LDLDIRECT in the last 72 hours. Thyroid Function Tests: Recent Labs    08/24/19 0538  TSH 7.630*   Anemia Panel: No results for input(s): VITAMINB12, FOLATE, FERRITIN, TIBC, IRON, RETICCTPCT in the last 72 hours. Urine analysis:    Component Value Date/Time   COLORURINE AMBER (A) 08/24/2019 0427   APPEARANCEUR CLEAR 08/24/2019 0427   LABSPEC 1.027 08/24/2019 0427   PHURINE 6.0 08/24/2019 0427   GLUCOSEU >=500 (A) 08/24/2019 0427   HGBUR LARGE (A) 08/24/2019 0427   BILIRUBINUR NEGATIVE 08/24/2019 0427   KETONESUR 5 (A) 08/24/2019 0427   PROTEINUR 100 (A) 08/24/2019 0427   UROBILINOGEN 0.2 02/27/2015 1733   NITRITE NEGATIVE 08/24/2019 0427    LEUKOCYTESUR MODERATE (A) 08/24/2019 0427     Benito Mccreedy M.D. [facility] 08/25/2019, 9:45 AM  Pager: [phone] Between 7am to 7pm - call Pager - [phone]  After 7pm go to [email or web address] - password [email or web address]  Call night coverage person covering after 7pm

## 2019-08-26 LAB — CBC
HCT: 26.4 % — ABNORMAL LOW (ref 36.0–46.0)
Hemoglobin: 8.1 g/dL — ABNORMAL LOW (ref 12.0–15.0)
MCH: 28.8 pg (ref 26.0–34.0)
MCHC: 30.7 g/dL (ref 30.0–36.0)
MCV: 94 fL (ref 80.0–100.0)
Platelets: 290 10*3/uL (ref 150–400)
RBC: 2.81 MIL/uL — ABNORMAL LOW (ref 3.87–5.11)
RDW: 14.6 % (ref 11.5–15.5)
WBC: 11.1 10*3/uL — ABNORMAL HIGH (ref 4.0–10.5)
nRBC: 0.2 % (ref 0.0–0.2)

## 2019-08-26 LAB — COMPREHENSIVE METABOLIC PANEL
ALT: 44 U/L (ref 0–44)
AST: 25 U/L (ref 15–41)
Albumin: 2.3 g/dL — ABNORMAL LOW (ref 3.5–5.0)
Alkaline Phosphatase: 155 U/L — ABNORMAL HIGH (ref 38–126)
Anion gap: 12 (ref 5–15)
BUN: 30 mg/dL — ABNORMAL HIGH (ref 6–20)
CO2: 25 mmol/L (ref 22–32)
Calcium: 8.9 mg/dL (ref 8.9–10.3)
Chloride: 101 mmol/L (ref 98–111)
Creatinine, Ser: 2.64 mg/dL — ABNORMAL HIGH (ref 0.44–1.00)
GFR calc Af Amer: 24 mL/min — ABNORMAL LOW (ref >60)
GFR calc non Af Amer: 20 mL/min — ABNORMAL LOW (ref >60)
Glucose, Bld: 195 mg/dL — ABNORMAL HIGH (ref 70–99)
Potassium: 3.8 mmol/L (ref 3.5–5.1)
Sodium: 138 mmol/L (ref 135–145)
Total Bilirubin: 0.5 mg/dL (ref 0.3–1.2)
Total Protein: 5.8 g/dL — ABNORMAL LOW (ref 6.5–8.1)

## 2019-08-26 LAB — GLUCOSE, CAPILLARY
Glucose-Capillary: 194 mg/dL — ABNORMAL HIGH (ref 70–99)
Glucose-Capillary: 173 mg/dL — ABNORMAL HIGH (ref 70–99)
Glucose-Capillary: 192 mg/dL — ABNORMAL HIGH (ref 70–99)
Glucose-Capillary: 172 mg/dL — ABNORMAL HIGH (ref 70–99)
Glucose-Capillary: 205 mg/dL — ABNORMAL HIGH (ref 70–99)
Glucose-Capillary: 239 mg/dL — ABNORMAL HIGH (ref 70–99)
Glucose-Capillary: 203 mg/dL — ABNORMAL HIGH (ref 70–99)

## 2019-08-26 MED ORDER — HYDROMORPHONE HCL 1 MG/ML IJ SOLN
1.0000 mg | INTRAMUSCULAR | Status: DC | PRN
  Administered 2019-08-27: 1 mg via INTRAVENOUS
  Filled 2019-08-26: qty 1

## 2019-08-26 MED ORDER — MOMETASONE FURO-FORMOTEROL FUM 100-5 MCG/ACT IN AERO
2.0000 | INHALATION_SPRAY | Freq: Two times a day (BID) | RESPIRATORY_TRACT | Status: DC
  Administered 2019-08-26 – 2019-08-29 (×7): 2 via RESPIRATORY_TRACT
  Filled 2019-08-26: qty 8.8

## 2019-08-26 MED ORDER — MORPHINE SULFATE ER 15 MG PO TBCR
15.0000 mg | EXTENDED_RELEASE_TABLET | Freq: Two times a day (BID) | ORAL | Status: DC
  Administered 2019-08-27 – 2019-08-29 (×5): 15 mg via ORAL
  Filled 2019-08-26 (×6): qty 1

## 2019-08-26 NOTE — Progress Notes (Signed)
Inpatient Diabetes Program Recommendations  AACE/ADA: New Consensus Statement on Inpatient Glycemic Control (2015)  Target Ranges:  Prepandial:   less than 140 mg/dL      Peak postprandial:   less than 180 mg/dL (1-2 hours)      Critically ill patients:  140 - 180 mg/dL   Results for [name], [name] (MRN [id number]) as of 08/26/2019 10:52  Ref. Range 08/26/2019 00:06 08/26/2019 04:03 08/26/2019 05:47 08/26/2019 07:28  Glucose-Capillary Latest Ref Range: 70 - 99 mg/dL 194 (H) 192 (H) 173 (H) 172 (H)     Home DM Meds: Byetta 10 mcg BID       NPH Insulin 220 units QAM  Current Orders: Lantus 35 units QHS      Novolog Moderate Correction Scale/ SSI (0-15 units) Q4 hours     Eating well per documentation and getting Ensure Max Protein supps BID.  MD- Please consider the following in-hospital insulin adjustments:  1. Change Novolog SSi timing to TID AC + HS (currently ordered Q4 hours)  2. Increase Lantus slightly to 37 units QHS     --Will follow patient during hospitalization--  Wyn Quaker RN, MSN, CDE Diabetes Coordinator Inpatient Glycemic Control Team Team Pager: [phone] (8a-5p)

## 2019-08-26 NOTE — Progress Notes (Signed)
Physical Therapy Treatment Patient Details Name: [name] MRN: [id number] DOB: Feb 13, 1971 Today's Date: 08/26/2019    History of Present Illness Pt is a 49 y/o female transferred from [facility] for completion of care. Pt has a PMH including but not limited to hepatic mass prior negative by biopsy status post ablation with resolution morbid obesity, lupus, Sjogren's syndrome, Combined systolic/diastolic HF, diabetes mellitus type 2 with gastroparesis, history of CVA, hyperlipidemia, OSA  Admitted for treatment of severe abdominal pain status post embolectomy.    PT Comments    Patient progressing well towards PT goals. Pt reports feeling tired today but agreeable to PT. Requires less assist for bed mobility and standing today from prior sessions. Requires Mod A of 2 to stand from elevated bed height multiple times for functional strengthening. Cues for upright posture; noted to have bil knee instability but no buckling. Able to take a 2-3 steps along side bed with assist. Sp02 ranged from 87-95% on 2L/min 02 [place]. Continues to recommend CIR. Will follow.   Follow Up Recommendations  CIR     Equipment Recommendations  None recommended by PT    Recommendations for Other Services       Precautions / Restrictions Precautions Precautions: Fall Precaution Comments: PCA pump Restrictions Weight Bearing Restrictions: No    Mobility  Bed Mobility Overal bed mobility: Needs Assistance Bed Mobility: Supine to Sit     Supine to sit: Mod assist;HOB elevated Sit to supine: Mod assist;HOB elevated   General bed mobility comments: Able to bring LEs to EOB, assist with trunk and scooting bottom. Assist to bring LEs into bed to return to supine, + lightheadedness.  Transfers Overall transfer level: Needs assistance Equipment used: Rolling walker (2 wheeled) Transfers: Sit to/from Stand Sit to Stand: Mod assist;+2 physical assistance;From elevated surface         General transfer comment:  Assist of 2 to stand from EOB x3 with use of momentum and cues for upright. Bil knee instability. Sp02 87% on 2L/min 02 [place].  Ambulation/Gait Ambulation/Gait assistance: Mod assist;+2 safety/equipment Gait Distance (Feet): 3 Feet Assistive device: Rolling walker (2 wheeled) Gait Pattern/deviations: Wide base of support;Trunk flexed;Shuffle Gait velocity: decr   General Gait Details: Able to side step x2 along bed with assist for weight shifting and RW management. Bil knee instability.   Stairs             Wheelchair Mobility    Modified Rankin (Stroke Patients Only)       Balance Overall balance assessment: Needs assistance Sitting-balance support: Feet supported;No upper extremity supported Sitting balance-Leahy Scale: Fair     Standing balance support: During functional activity Standing balance-Leahy Scale: Poor Standing balance comment: reliant on external support                            Cognition Arousal/Alertness: Lethargic Behavior During Therapy: Flat affect Overall Cognitive Status: Within Functional Limits for tasks assessed                                 General Comments: minimal verbalizations; laughed appropriately during session. Reports feeling tired.      Exercises      General Comments General comments (skin integrity, edema, etc.): Sp02 ranged from 87-96% on 2L.min 02 .      Pertinent Vitals/Pain Pain Assessment: Faces Faces Pain Scale: Hurts a little bit Pain Location:  back Pain Descriptors / Indicators: Discomfort;Sore Pain Intervention(s): Repositioned;Monitored during session;PCA encouraged    Home Living                      Prior Function            PT Goals (current goals can now be found in the care plan section) Progress towards PT goals: Progressing toward goals    Frequency    Min 3X/week      PT Plan Current plan remains appropriate    Co-evaluation               AM-PAC PT "6 Clicks" Mobility   Outcome Measure  Help needed turning from your back to your side while in a flat bed without using bedrails?: A Little Help needed moving from lying on your back to sitting on the side of a flat bed without using bedrails?: A Lot Help needed moving to and from a bed to a chair (including a wheelchair)?: A Lot Help needed standing up from a chair using your arms (e.g., wheelchair or bedside chair)?: A Lot Help needed to walk in hospital room?: A Lot Help needed climbing 3-5 steps with a railing? : Total 6 Click Score: 12    End of Session Equipment Utilized During Treatment: Oxygen Activity Tolerance: Patient tolerated treatment well;Patient limited by pain Patient left: in bed;with call bell/phone within reach;with bed alarm set;with SCD's reapplied Nurse Communication: Mobility status PT Visit Diagnosis: Other abnormalities of gait and mobility (R26.89);Muscle weakness (generalized) (M62.81)     Time: [id number] PT Time Calculation (min) (ACUTE ONLY): 23 min  Charges:  $Therapeutic Activity: 23-37 mins                     Marisa Severin, PT, DPT Acute Rehabilitation Services Pager [phone] Office [phone]       Marguarite Arbour A Sabra Heck 08/26/2019, 12:11 PM

## 2019-08-26 NOTE — Consult Note (Signed)
WOC Nurse Consult Note: Reason for Consult:maceration Wound type: inframammary MASD (moisture associated skin damage)  Pressure Injury POA: NA Measurement:linear area; partial thickness under the right breast Wound bed: pink Drainage (amount, consistency, odor) minimal; with strong yeast like odor Periwound: intact  Dressing procedure/placement/frequency: Change out antimicrobial wicking fabric; making sure to have at least 2" hanging below the breast.  The will allow for wicking of the moisture and the silver properties will aid assisting with clearing the candida.  I was not able to find any skin breakdown in her groins.  She is a large wound and is very limited on her ability to move in the bed.  When bathing she needs to make sure her skin folds are completley dry to avoid worsening of the affected areas  Discussed POC with patient and bedside nurse.  Re consult if needed, will not follow at this time. Thanks  Staria Birkhead [employer], RN,CWOCN, CNS, [place] [phone])

## 2019-08-26 NOTE — TOC Initial Note (Signed)
Transition of Care [facility]) - Initial/Assessment Note    Patient Details  Name: [name] MRN: [id number] Date of Birth: [birth year]  [facility]) CM/SW Contact:    Zenon Mayo, RN Phone Number: 08/26/2019, 7:06 PM  Clinical Narrative:                 From home with friends PT recommending CIR. Patient is 425 lbs, also has an aide through [facility]. TOC will cont to follow for dc needs.     Expected Discharge Plan: IP Rehab Facility Barriers to Discharge: Continued Medical Work up   Patient Goals and CMS Choice        Expected Discharge Plan and Services Expected Discharge Plan: [place]       Living arrangements for the past 2 months: Single Family Home                                      Prior Living Arrangements/Services Living arrangements for the past 2 months: Single Family Home Lives with:: Friends                   Activities of Daily Living Home Assistive Devices/Equipment: Radio producer (specify quad or straight), Walker (specify type) ADL Screening (condition at time of admission) Patient's cognitive ability adequate to safely complete daily activities?: Yes Is the patient deaf or have difficulty hearing?: No Does the patient have difficulty seeing, even when wearing glasses/contacts?: No Does the patient have difficulty concentrating, remembering, or making decisions?: No Patient able to express need for assistance with ADLs?: Yes Does the patient have difficulty dressing or bathing?: No Independently performs ADLs?: Yes (appropriate for developmental age) Does the patient have difficulty walking or climbing stairs?: Yes Weakness of Legs: Both Weakness of Arms/Hands: None  Permission Sought/Granted                  Emotional Assessment       Orientation: : Oriented to Self, Oriented to Place, Oriented to  Time, Oriented to Situation      Admission diagnosis:  Liver hemorrhage [K76.89] Patient  Active Problem List   Diagnosis Date Noted  . Obesity hypoventilation syndrome ([place]) 08/23/2019  . Skin breakdown 08/23/2019  . Essential hypertension 08/23/2019  . Liver hemorrhage 08/23/2019  . Acute blood loss anemia 08/13/2019  . Hepatic adenoma   . Hemoperitoneum   . Diabetic acidosis without coma ([place])   . Multifocal pneumonia 09/23/2017  . Acute respiratory failure ([place]) 09/21/2017  . CHF exacerbation ([place]) 09/20/2017  . Costochondritis 06/06/2017  . Chest pain 06/04/2017  . Diabetes ([place]) 09/19/2015  . Radiculopathy of cervical spine   . Severe headache 03/05/2014  . Family history of malignant neoplasm of breast   . Family history of malignant neoplasm of ovary   . Intractable nausea and vomiting 11/11/2013  . Septate uterus 09/08/2013  . Diabetic gastroparesis ([place]) 04/28/2013  . Constipation due to pain medication 04/21/2013  . Odynophagia 04/04/2013  . Liver masses 02/28/2013  . Reflux esophagitis 02/26/2013  . Morbid obesity ([place]) 10/10/2012  . Chronic diastolic CHF (congestive heart failure) ([place]) 10/09/2012  . Chronic respiratory failure with hypoxia ([place]) 10/09/2012  . Clinical polymyositis syndrome ([place]) 09/06/2012  . Mononeuritis of lower limb 01/12/2012  . HTN (hypertension) 11/08/2011  . Systolic CHF, acute on chronic ([place]) 11/08/2011  . HLD (hyperlipidemia) 07/19/2011  . Bell's palsy  08/09/2010  . SLE 08/09/2010  . [place] SYNDROME 08/09/2010   PCP:  Bartholome Bill, MD Pharmacy:   [facility], [place] [id number] [place] [id number] [place] [address] Phone: [phone] Fax: [phone]  [id number] [place], [place] [address] Phone: [phone] Fax: [phone]     Social Determinants of Health (SDOH) Interventions    Readmission Risk Interventions No flowsheet data found.

## 2019-08-26 NOTE — Progress Notes (Signed)
PROGRESS NOTE    [name]  [id number] DOB: 1970-12-25 DOA: 08/23/2019 PCP: Bartholome Bill, MD    Brief Narrative:  49 year old female who was transferred from [facility].  She does have significant past medical history for morbid obesity, Sjogren's syndrome, lupus, systolic heart failure, type 2 diabetes mellitus, gastroparesis, dyslipidemia, history of CVA and obstructive sleep apnea. Patient initially presented to [facility] August 12, 2019 with 2-day history of nausea, vomiting and abdominal pain.  She was diagnosed with hemorrhagic shock due to bleeding hepatic adenoma.  January 17 patient was transferred to [facility] for further management. January 20 she underwent embolization of right lobe adenomas with improvement of bleeding.  Postprocedure patient continued to have abdominal pain, she was placed on a PCA pump for further pain control and transferred back to [place] (08/23/19) for further pain management.    Assessment & Plan:   Active Problems:   SLE   SJOGREN'S SYNDROME   HTN (hypertension)   Chronic respiratory failure with hypoxia (HCC)   Morbid obesity (HCC)   Liver masses   Diabetic gastroparesis (HCC)   HLD (hyperlipidemia)   Diabetes (HCC)   Hepatic adenoma   Hemoperitoneum   Acute blood loss anemia   Obesity hypoventilation syndrome (HCC)   Skin breakdown   Essential hypertension   Liver hemorrhage   1. Sp bleeding hepatic adenoma/ acute blood loss anemia/ sp embolization 01/20 ([facility]). Patient is tolerating po well, no nausea or vomiting. Pain is controlled with hydromorphone PCA pump. Yesterday used 3,6 bolus. Will order morphine 15 mg bid and as needed IV hydromorphone q2 H. Will monitor response.   2. Uncontrolled T2DM (Hgb A1c 8,6)/ dyslipidemia/ sp DKA.  Fasting glucose this am is 195, will continue insulin sliding scale for glucose cover and monitoring.   Basal insulin 35 units qhs. Continue with pravastatin.   3. Heart failure  systolic dysfunction. Mild pitting edema at the lower extremities, will continue close monitoring of volume status, continue blood pressure control. Continue carvedilol and bidil.  4. Hx of CVA. Patient with very poor mobility, will follow with physical therapy recommendations.   5. Obesity/ obesity hypoventilation syndrome. Continue bipap at night and close oxymetry monitoring. Follow with physical therapy and occupational therapy recommendations.   6. AKI New worsening renal function with serum cr up to 2,64, with K at 3,8 and serum bicarbonate at 25. Noted mild edema lower extremities, difficult to assess volume status due to obesity. Will avoid hypotension and nephrotoxic medications, will follow on renal panel in am.   7. Morbid obesity. Calculated BMI is 64,9, Will need close follow up. Patient in high risk for hospital complications.   8. Anxiety. Continue with alprazolam as needed. On bupropion, duloxetine and topiramate.   9. HTN. Continue blood pressure control with carvedilol and amlodipine. On Bidil.   10. SLE. Continue with plaquenil and prednisone. No signs of acute flare.   34. Hypothyroid.  Continue levothyroxine.   Patient continue to be at high risk for complications.   DVT prophylaxis: enoxaparin   Code status: full Family Communication: no family at the bedside  Disposition Plan/ discharge barriers: patient needs pain control.    Nutrition Status: Nutrition Problem: Increased nutrient needs Etiology: chronic illness(CHF, DM) Signs/Symptoms: estimated needs Interventions: MVI, Premier Protein    Subjective: Patient continue to have pain at the right upper quadrant, controlled with IV analgesia, no nausea or vomiting, no chest pain or dyspnea, mobility is limited due to obesity.  Objective: Vitals:   08/26/19 0400 08/26/19 0400 08/26/19 0408 08/26/19 0751  BP:  133/69  130/65  Pulse:  81  88  Resp: 18 20  18   Temp:  98.7 F (37.1 C)  98 F (36.7 C)    TempSrc:  Oral    SpO2: 97% 100%  97%  Weight:   (!) 194.1 kg   Height:        Intake/Output Summary (Last 24 hours) at 08/26/2019 0754 Last data filed at 08/26/2019 0503 Gross per 24 hour  Intake 750 ml  Output 100 ml  Net 650 ml   Filed Weights   08/24/19 0020 08/25/19 0500 08/26/19 0408  Weight: (!) 194 kg (!) 193.8 kg (!) 194.1 kg    Examination:   General: Not in pain or dyspnea, deconditioned  Neurology: Awake and alert, non focal  E ENT: no pallor, no icterus, oral mucosa moist Cardiovascular: No JVD. S1-S2 present, rhythmic, no gallops, rubs, or murmurs. Treace pitting lower extremity edema. Pulmonary: positive breath sounds bilaterally, adequate air movement, no wheezing, rhonchi or rales. Gastrointestinal. Abdomen protuberant with no organomegaly, non tender, no rebound or guarding Skin. No rashes Musculoskeletal: no joint deformities     Data Reviewed: I have personally reviewed following labs and imaging studies  CBC: Recent Labs  Lab 08/23/19 2244 08/24/19 0538 08/25/19 1154 08/25/19 1935 08/26/19 0426  WBC 13.6* 14.6* 11.6* 12.8* 11.1*  NEUTROABS 10.8*  --   --   --   --   HGB 8.9* 8.7* 8.6* 8.5* 8.1*  HCT 29.7* 29.1* 28.7* 27.7* 26.4*  MCV 95.2 95.1 94.4 94.2 94.0  PLT 310 323 317 308 [id number]   Basic Metabolic Panel: Recent Labs  Lab 08/23/19 2244 08/24/19 0538 08/26/19 0426  NA 136 138 138  K 3.8 4.0 3.8  CL 100 102 101  CO2 27 29 25   GLUCOSE 186* 176* 195*  BUN 12 15 30*  CREATININE 1.04* 0.92 2.64*  CALCIUM 8.7* 8.9 8.9  MG  --  1.8  --   PHOS  --  3.0  --    GFR: Estimated Creatinine Clearance: 47.2 mL/min (A) (by C-G formula based on SCr of 2.64 mg/dL (H)). Liver Function Tests: Recent Labs  Lab 08/23/19 2244 08/24/19 0538 08/26/19 0426  AST 85* 64* 25  ALT 86* 75* 44  ALKPHOS 168* 165* 155*  BILITOT 0.9 1.1 0.5  PROT 5.8* 5.8* 5.8*  ALBUMIN 2.3* 2.3* 2.3*   No results for input(s): LIPASE, AMYLASE in the last 168  hours. No results for input(s): AMMONIA in the last 168 hours. Coagulation Profile: Recent Labs  Lab 08/23/19 2244  INR 1.2   Cardiac Enzymes: No results for input(s): CKTOTAL, CKMB, CKMBINDEX, TROPONINI in the last 168 hours. BNP (last 3 results) No results for input(s): PROBNP in the last 8760 hours. HbA1C: Recent Labs    08/23/19 2244  HGBA1C 8.6*   CBG: Recent Labs  Lab 08/25/19 2014 08/26/19 0006 08/26/19 0403 08/26/19 0547 08/26/19 0728  GLUCAP 190* 194* 192* 173* 172*   Lipid Profile: No results for input(s): CHOL, HDL, LDLCALC, TRIG, CHOLHDL, LDLDIRECT in the last 72 hours. Thyroid Function Tests: Recent Labs    08/24/19 0538  TSH 7.630*   Anemia Panel: No results for input(s): VITAMINB12, FOLATE, FERRITIN, TIBC, IRON, RETICCTPCT in the last 72 hours.    Radiology Studies: I have reviewed all of the imaging during this hospital visit personally     Scheduled Meds: . amLODipine  5 mg  Oral Daily  . azaTHIOprine  150 mg Oral BID  . buPROPion  300 mg Oral Daily  . carvedilol  12.5 mg Oral BID WC  . DULoxetine  120 mg Oral QHS  . famotidine  20 mg Oral BID  . HYDROmorphone   Intravenous Q4H  . hydroxychloroquine  200 mg Oral BID  . insulin aspart  0-15 Units Subcutaneous Q4H  . insulin glargine  35 Units Subcutaneous QHS  . isosorbide-hydrALAZINE  2 tablet Oral TID  . levothyroxine  50 mcg Oral Q0600  . loratadine  10 mg Oral Daily  . mometasone-formoterol  2 puff Inhalation BID  . multivitamin with minerals  1 tablet Oral Daily  . oxybutynin  15 mg Oral Daily  . pravastatin  10 mg Oral Daily  . predniSONE  3 mg Oral Daily  . pregabalin  75 mg Oral TID  . Ensure Max Protein  11 oz Oral BID  . sacubitril-valsartan  1 tablet Oral BID  . sodium chloride flush  3 mL Intravenous Q12H  . spironolactone  50 mg Oral Daily  . sucralfate  1 g Oral BID  . topiramate  75 mg Oral QHS  . torsemide  20 mg Oral BID   Continuous Infusions: . sodium  chloride       LOS: 3 days        Jaquaya Coyle Gerome Apley, MD

## 2019-08-27 LAB — GLUCOSE, CAPILLARY
Glucose-Capillary: 110 mg/dL — ABNORMAL HIGH (ref 70–99)
Glucose-Capillary: 126 mg/dL — ABNORMAL HIGH (ref 70–99)
Glucose-Capillary: 138 mg/dL — ABNORMAL HIGH (ref 70–99)
Glucose-Capillary: 182 mg/dL — ABNORMAL HIGH (ref 70–99)
Glucose-Capillary: 192 mg/dL — ABNORMAL HIGH (ref 70–99)
Glucose-Capillary: 275 mg/dL — ABNORMAL HIGH (ref 70–99)

## 2019-08-27 MED ORDER — HYDROMORPHONE HCL 1 MG/ML IJ SOLN
1.0000 mg | INTRAMUSCULAR | Status: DC | PRN
  Administered 2019-08-27: 1 mg via INTRAVENOUS
  Filled 2019-08-27: qty 1

## 2019-08-27 NOTE — TOC Initial Note (Addendum)
Transition of Care [facility]) - Initial/Assessment Note    Patient Details  Name: [name] MRN: [id number] Date of Birth: [birth year]  [facility]) CM/SW Contact:    Alberteen Sam, [place] Phone Number: [phone] 08/27/2019, 3:39 PM  Clinical Narrative:                  CSW spoke with patient at bedside regarding discharge planning. CSW informed patient of needed backup plan should patient not be accepted into CIR. Patient reports she would refuse SNF level of care as she believes she would do better at home surrounded by family support. She reports receiving care through [facility] currently and is agreeable for  Home health PT to be set up. Will need DME 3in1 at discharge.   CSW made referral to [place] with [facility] who declined, CSW made referral to [place] with [facility] who declined. CSW made referral to [facility] with [facility] who declined. CSW sent referrals to [facility] with Nanine Means and Gari Crown with [facility], pending their responses.     Expected Discharge Plan: [place] Barriers to Discharge: Continued Medical Work up   Patient Goals and CMS Choice   CMS Medicare.gov Compare Post Acute Care list provided to:: Patient Choice offered to / list presented to : Patient  Expected Discharge Plan and Services Expected Discharge Plan: [place] Acute Care Choice: [place] arrangements for the past 2 months: Single Family Home                                      Prior Living Arrangements/Services Living arrangements for the past 2 months: Single Family Home Lives with:: Self Patient language and need for interpreter reviewed:: Yes Do you feel safe going back to the place where you live?: Yes      Need for Family Participation in Patient Care: Yes (Comment) Care giver support system in place?: Yes (comment)   Criminal Activity/Legal Involvement Pertinent to Current Situation/Hospitalization: No - Comment as  needed  Activities of Daily Living Home Assistive Devices/Equipment: Cane (specify quad or straight), Walker (specify type) ADL Screening (condition at time of admission) Patient's cognitive ability adequate to safely complete daily activities?: Yes Is the patient deaf or have difficulty hearing?: No Does the patient have difficulty seeing, even when wearing glasses/contacts?: No Does the patient have difficulty concentrating, remembering, or making decisions?: No Patient able to express need for assistance with ADLs?: Yes Does the patient have difficulty dressing or bathing?: No Independently performs ADLs?: Yes (appropriate for developmental age) Does the patient have difficulty walking or climbing stairs?: Yes Weakness of Legs: Both Weakness of Arms/Hands: None  Permission Sought/Granted                  Emotional Assessment Appearance:: Appears stated age Attitude/Demeanor/Rapport: Gracious Affect (typically observed): Calm Orientation: : Oriented to Self, Oriented to Place, Oriented to  Time, Oriented to Situation Alcohol / Substance Use: Not Applicable Psych Involvement: No (comment)  Admission diagnosis:  Liver hemorrhage [K76.89] Patient Active Problem List   Diagnosis Date Noted  . Obesity hypoventilation syndrome ([place]) 08/23/2019  . Skin breakdown 08/23/2019  . Essential hypertension 08/23/2019  . Liver hemorrhage 08/23/2019  . Acute blood loss anemia 08/13/2019  . Hepatic adenoma   . Hemoperitoneum   . Diabetic acidosis without coma ([place])   . Multifocal pneumonia  09/23/2017  . Acute respiratory failure ([place]) 09/21/2017  . CHF exacerbation ([place]) 09/20/2017  . Costochondritis 06/06/2017  . Chest pain 06/04/2017  . Diabetes ([place]) 09/19/2015  . Radiculopathy of cervical spine   . Severe headache 03/05/2014  . Family history of malignant neoplasm of breast   . Family history of malignant neoplasm of ovary   . Intractable nausea and vomiting 11/11/2013  .  Septate uterus 09/08/2013  . Diabetic gastroparesis ([place]) 04/28/2013  . Constipation due to pain medication 04/21/2013  . Odynophagia 04/04/2013  . Liver masses 02/28/2013  . Reflux esophagitis 02/26/2013  . Morbid obesity ([place]) 10/10/2012  . Chronic diastolic CHF (congestive heart failure) ([place]) 10/09/2012  . Chronic respiratory failure with hypoxia ([place]) 10/09/2012  . Clinical polymyositis syndrome ([place]) 09/06/2012  . Mononeuritis of lower limb 01/12/2012  . HTN (hypertension) 11/08/2011  . Systolic CHF, acute on chronic ([place]) 11/08/2011  . HLD (hyperlipidemia) 07/19/2011  . Bell's palsy 08/09/2010  . SLE 08/09/2010  . [place] SYNDROME 08/09/2010   PCP:  Bartholome Bill, MD Pharmacy:   [facility], [place] [id number] [place] [id number] [place] [address] Phone: [phone] Fax: [phone]  [id number] [place], [place] [address] Phone: [phone] Fax: [phone]     Social Determinants of Health (SDOH) Interventions    Readmission Risk Interventions No flowsheet data found.

## 2019-08-27 NOTE — Progress Notes (Addendum)
Inpatient Rehabilitation-Admissions Coordinator   Met with pt bedside for IP Rehab assessment. Pt was sleeping soundly but did awaken to voice and was able to have short discussion with [facility] regarding rehab. AC discussed recommended program, intensity of the program, possible duration, and anticipated assistance needed at DC. Pt reports she required varying assist PTA and has an aide 80 hrs/month to assist with ADLs and whatever else she needed. We discussed that pt may need increased assistance at DC. She gave me permission to follow up with her roommate, Thereasa Solo, and her sister Tawnya Crook to gather more information about what type of assistance they may be able to provide if pt was to have a short rehab stay with IP Rehab. Await call back from pt's family/friends to help determine dispo. Pt aware that if sufficient support is not found, pt may need SNF.   Will follow up.   Raechel Ache, OTR/L  Rehab Admissions Coordinator  [phone] 08/27/2019 11:19 AM

## 2019-08-27 NOTE — Progress Notes (Signed)
PROGRESS NOTE    [name]  [id number] DOB: October 26, 1970 DOA: 08/23/2019 PCP: Bartholome Bill, MD    Brief Narrative:  49 year old female who was transferred from [facility].  She does have significant past medical history for morbid obesity, Sjogren's syndrome, lupus, systolic heart failure, type 2 diabetes mellitus, gastroparesis, dyslipidemia, history of CVA and obstructive sleep apnea. Patient initially presented to [facility] August 12, 2019 with 2-day history of nausea, vomiting and abdominal pain.  She was diagnosed with hemorrhagic shock due to bleeding hepatic adenoma.  January 17 patient was transferred to [facility] for further management. January 20 she underwent embolization of right lobe adenomas with improvement of bleeding.  Postprocedure patient continued to have abdominal pain, she was placed on a PCA pump for further pain control and transferred back to [place] (08/23/19) for further pain management.  Patient has been wean off PCU pump with good toleration, physical therapy has recommended inpatient rehabilitation evaluation.    Assessment & Plan:   Principal Problem:   Liver hemorrhage Active Problems:   SLE   SJOGREN'S SYNDROME   HTN (hypertension)   Chronic respiratory failure with hypoxia (HCC)   Morbid obesity (HCC)   Liver masses   Diabetic gastroparesis (HCC)   HLD (hyperlipidemia)   Diabetes (HCC)   Hepatic adenoma   Hemoperitoneum   Acute blood loss anemia   Obesity hypoventilation syndrome (HCC)   Skin breakdown   Essential hypertension   1. Sp bleeding hepatic adenoma/ acute blood loss anemia/ sp embolization 01/20 ([facility]). patient has been transitioned to oral morphine 15 mg bid and as needed IV hydromorphone q2 H, with good toleration. Will continue neuro checks per unit protocol and mobility as tolerated. Will consult inpatient rehab for evaluation.   2. Uncontrolled T2DM (Hgb A1c 8,6)/ dyslipidemia/ sp DKA.  Fasting glucose  this am is 129, patient tolerating po well, plan to continue insulin sliding scale for glucose cover and monitoring, plus a basal insulin dose of 35 units qhs. On pravastatin.   3. Heart failure systolic dysfunction. Continue heart failure management with carvedilol and bidil. Blood pressure systolic [id number] mmHg.   4. Hx of CVA. Inpatient rehab consultation.    5. Obesity/ obesity hypoventilation syndrome. On bipap at night with good toleration. Out of bed as tolerated.  6. AKI. Renal function with serum cr down to 1,50 from 2,64, K at 3,8 and serum bicarbonate at 28. Will continue close monitoring of renal function and electrolytes, avoid hypotension or nephrotoxic medications.   7. Morbid obesity. Her calculated BMI is 64,9.   8. Anxiety. On alprazolam as needed. Continue with bupropion, duloxetine and topiramate.   9. HTN. On carvedilol, Bidil and amlodipine, for blood pressure control.    10. SLE. On plaquenil and prednisone. No current signs of acute flare.   84. Hypothyroid.  On levothyroxine.    DVT prophylaxis: enoxaparin   Code status: full Family Communication: no family at the bedside  Disposition Plan/ discharge barriers: pending inpatient rehab recommendations.    Nutrition Status: Nutrition Problem: Increased nutrient needs Etiology: chronic illness(CHF, DM) Signs/Symptoms: estimated needs Interventions: MVI, Premier Protein    Subjective: Abdominal pain has improved with oral analgesics, patient is tolerating po well, now off PCA pump. She continue to be very weak and deconditioned, before discharge she was able to ambulate with no assistance. Lives with roommate.   Objective: Vitals:   08/27/19 0518 08/27/19 0533 08/27/19 0731 08/27/19 0844  BP: (!) 144/71  (!) 147/69  132/67  Pulse: 83  78 84  Resp: 18 18 18    Temp: 98.7 F (37.1 C)  98 F (36.7 C)   TempSrc:   Oral   SpO2: 97%  95% 97%  Weight: (!) 193 kg     Height:        Intake/Output  Summary (Last 24 hours) at 08/27/2019 0848 Last data filed at 08/27/2019 0500 Gross per 24 hour  Intake 1010 ml  Output 1650 ml  Net -640 ml   Filed Weights   08/25/19 0500 08/26/19 0408 08/27/19 0518  Weight: (!) 193.8 kg (!) 194.1 kg (!) 193 kg    Examination:   General: Not in pain or dyspnea. Deconditioned  Neurology: Awake and alert, non focal  E ENT: no pallor, no icterus, oral mucosa moist Cardiovascular: No JVD. S1-S2 present, rhythmic, no gallops, rubs, or murmurs. No lower extremity edema. Pulmonary: positive breath sounds bilaterally, decreased air movement due to body habitus, no wheezing, rhonchi or rales. Gastrointestinal. Abdomen protuberant with no organomegaly, non tender, no rebound or guarding Skin. No rashes Musculoskeletal: no joint deformities     Data Reviewed: I have personally reviewed following labs and imaging studies  CBC: Recent Labs  Lab 08/23/19 2244 08/24/19 0538 08/25/19 1154 08/25/19 1935 08/26/19 0426  WBC 13.6* 14.6* 11.6* 12.8* 11.1*  NEUTROABS 10.8*  --   --   --   --   HGB 8.9* 8.7* 8.6* 8.5* 8.1*  HCT 29.7* 29.1* 28.7* 27.7* 26.4*  MCV 95.2 95.1 94.4 94.2 94.0  PLT 310 323 317 308 [id number]   Basic Metabolic Panel: Recent Labs  Lab 08/23/19 2244 08/24/19 0538 08/26/19 0426 08/27/19 0528  NA 136 138 138 142  K 3.8 4.0 3.8 3.8  CL 100 102 101 106  CO2 27 29 25 28   GLUCOSE 186* 176* 195* 129*  BUN 12 15 30* 29*  CREATININE 1.04* 0.92 2.64* 1.50*  CALCIUM 8.7* 8.9 8.9 8.9  MG  --  1.8  --   --   PHOS  --  3.0  --   --    GFR: Estimated Creatinine Clearance: 82.7 mL/min (A) (by C-G formula based on SCr of 1.5 mg/dL (H)). Liver Function Tests: Recent Labs  Lab 08/23/19 2244 08/24/19 0538 08/26/19 0426  AST 85* 64* 25  ALT 86* 75* 44  ALKPHOS 168* 165* 155*  BILITOT 0.9 1.1 0.5  PROT 5.8* 5.8* 5.8*  ALBUMIN 2.3* 2.3* 2.3*   No results for input(s): LIPASE, AMYLASE in the last 168 hours. No results for input(s):  AMMONIA in the last 168 hours. Coagulation Profile: Recent Labs  Lab 08/23/19 2244  INR 1.2   Cardiac Enzymes: No results for input(s): CKTOTAL, CKMB, CKMBINDEX, TROPONINI in the last 168 hours. BNP (last 3 results) No results for input(s): PROBNP in the last 8760 hours. HbA1C: No results for input(s): HGBA1C in the last 72 hours. CBG: Recent Labs  Lab 08/26/19 1625 08/26/19 2010 08/27/19 0017 08/27/19 0510 08/27/19 0729  GLUCAP 239* 203* 192* 126* 110*   Lipid Profile: No results for input(s): CHOL, HDL, LDLCALC, TRIG, CHOLHDL, LDLDIRECT in the last 72 hours. Thyroid Function Tests: No results for input(s): TSH, T4TOTAL, FREET4, T3FREE, THYROIDAB in the last 72 hours. Anemia Panel: No results for input(s): VITAMINB12, FOLATE, FERRITIN, TIBC, IRON, RETICCTPCT in the last 72 hours.    Radiology Studies: I have reviewed all of the imaging during this hospital visit personally     Scheduled Meds: . amLODipine  5  mg Oral Daily  . buPROPion  300 mg Oral Daily  . carvedilol  12.5 mg Oral BID WC  . DULoxetine  120 mg Oral QHS  . famotidine  20 mg Oral BID  . hydroxychloroquine  200 mg Oral BID  . insulin aspart  0-15 Units Subcutaneous Q4H  . insulin glargine  35 Units Subcutaneous QHS  . isosorbide-hydrALAZINE  2 tablet Oral TID  . levothyroxine  50 mcg Oral Q0600  . loratadine  10 mg Oral Daily  . mometasone-formoterol  2 puff Inhalation BID  . morphine  15 mg Oral Q12H  . multivitamin with minerals  1 tablet Oral Daily  . oxybutynin  15 mg Oral Daily  . pravastatin  10 mg Oral Daily  . predniSONE  3 mg Oral Daily  . pregabalin  75 mg Oral TID  . Ensure Max Protein  11 oz Oral BID  . sacubitril-valsartan  1 tablet Oral BID  . sodium chloride flush  3 mL Intravenous Q12H  . sucralfate  1 g Oral BID  . topiramate  75 mg Oral QHS   Continuous Infusions: . sodium chloride       LOS: 4 days        Jerah Esty Gerome Apley, MD

## 2019-08-27 NOTE — Progress Notes (Signed)
Physical Therapy Treatment Patient Details Name: [name] MRN: [id number] DOB: 1971/07/18 Today's Date: 08/27/2019    History of Present Illness Pt is a 49 y/o female transferred from [facility] for completion of care. Pt has a PMH including but not limited to hepatic mass prior negative by biopsy status post ablation with resolution morbid obesity, lupus, Sjogren's syndrome, Combined systolic/diastolic HF, diabetes mellitus type 2 with gastroparesis, history of CVA, hyperlipidemia, OSA  Admitted for treatment of severe abdominal pain status post embolectomy.    PT Comments    Pt progressing slowly. Practiced sit to stand transfers from bed with min A +2, working on maintaining standing for increasing periods of time, today 2 mins. Side stepped L with RW and min A +2, pt has difficulty lifting feet and foot clearance is minimal. No knee buckling noted today. SpO2 95% on 2L O2, DOE 2/4. Pt bed placed in chair position after session for her to eat. Pt would benefit from a bariatric bed, current one is very tight for her. PT will continue to follow.   Follow Up Recommendations  CIR     Equipment Recommendations  None recommended by PT    Recommendations for Other Services       Precautions / Restrictions Precautions Precautions: Fall Precaution Comments: PCA pump Restrictions Weight Bearing Restrictions: No    Mobility  Bed Mobility Overal bed mobility: Needs Assistance Bed Mobility: Supine to Sit;Sit to Supine     Supine to sit: HOB elevated;Min assist;+2 for physical assistance Sit to supine: Mod assist;HOB elevated;+2 for physical assistance   General bed mobility comments: min A at trunk for supine to sit, once pt slid LE's off bed. Min A to position squarely at EOB. Mod A for LE's for return to supine. Pt has difficulty moving in current bed due to body habitus, would benefit from wider bed.   Transfers Overall transfer level: Needs assistance Equipment used: Rolling  walker (2 wheeled) Transfers: Sit to/from Stand Sit to Stand: +2 physical assistance;From elevated surface;Min assist         General transfer comment: Min A +2 from bed, stood 2x and maintained standing >2 mins each time.   Ambulation/Gait Ambulation/Gait assistance: +2 safety/equipment;Min assist Gait Distance (Feet): 4 Feet Assistive device: Rolling walker (2 wheeled) Gait Pattern/deviations: Wide base of support;Trunk flexed;Shuffle Gait velocity: decr Gait velocity interpretation: <1.31 ft/sec, indicative of household ambulator General Gait Details: pt has difficulty stepping feet, improved with cueing and needed continued encouragement   Stairs             Wheelchair Mobility    Modified Rankin (Stroke Patients Only)       Balance Overall balance assessment: Needs assistance Sitting-balance support: Feet supported;No upper extremity supported Sitting balance-Leahy Scale: Fair     Standing balance support: During functional activity Standing balance-Leahy Scale: Poor Standing balance comment: reliant on external support                            Cognition Arousal/Alertness: Awake/alert Behavior During Therapy: Flat affect Overall Cognitive Status: Within Functional Limits for tasks assessed                                 General Comments: minimal verbalizations; laughed appropriately during session. Reports feeling tired.      Exercises      General Comments General comments (skin integrity, edema,  etc.): SpO2 95% on 2L, DOE 2/4. Bed left in chair position for pt to work on eating lunch. Goal given to stay in chair x30 mins. RN notified      Pertinent Vitals/Pain Pain Assessment: Faces Faces Pain Scale: Hurts a little bit Pain Location: back Pain Descriptors / Indicators: Discomfort;Sore Pain Intervention(s): Repositioned    Home Living                      Prior Function            PT Goals (current  goals can now be found in the care plan section) Acute Rehab PT Goals Patient Stated Goal: decrease pain, return to independence PT Goal Formulation: With patient Time For Goal Achievement: 09/07/19 Potential to Achieve Goals: Fair Progress towards PT goals: Progressing toward goals    Frequency    Min 3X/week      PT Plan Current plan remains appropriate    Co-evaluation              AM-PAC PT "6 Clicks" Mobility   Outcome Measure  Help needed turning from your back to your side while in a flat bed without using bedrails?: A Little Help needed moving from lying on your back to sitting on the side of a flat bed without using bedrails?: A Lot Help needed moving to and from a bed to a chair (including a wheelchair)?: A Lot Help needed standing up from a chair using your arms (e.g., wheelchair or bedside chair)?: A Lot Help needed to walk in hospital room?: A Lot Help needed climbing 3-5 steps with a railing? : Total 6 Click Score: 12    End of Session Equipment Utilized During Treatment: Gait belt;Oxygen Activity Tolerance: Patient tolerated treatment well Patient left: in bed;with call bell/phone within reach;with bed alarm set;with SCD's reapplied Nurse Communication: Mobility status PT Visit Diagnosis: Other abnormalities of gait and mobility (R26.89);Muscle weakness (generalized) (M62.81)     Time: [id number] PT Time Calculation (min) (ACUTE ONLY): 26 min  Charges:  $Gait Training: 8-22 mins $Therapeutic Activity: 8-22 mins                     Leighton Roach, [place]  Pager [phone] Office [place] 08/27/2019, 2:00 PM

## 2019-08-27 NOTE — Plan of Care (Signed)

## 2019-08-28 LAB — GLUCOSE, CAPILLARY
Glucose-Capillary: 168 mg/dL — ABNORMAL HIGH (ref 70–99)
Glucose-Capillary: 104 mg/dL — ABNORMAL HIGH (ref 70–99)
Glucose-Capillary: 146 mg/dL — ABNORMAL HIGH (ref 70–99)
Glucose-Capillary: 154 mg/dL — ABNORMAL HIGH (ref 70–99)
Glucose-Capillary: 133 mg/dL — ABNORMAL HIGH (ref 70–99)
Glucose-Capillary: 225 mg/dL — ABNORMAL HIGH (ref 70–99)
Glucose-Capillary: 167 mg/dL — ABNORMAL HIGH (ref 70–99)

## 2019-08-28 LAB — BASIC METABOLIC PANEL
Anion gap: 8 (ref 5–15)
BUN: 29 mg/dL — ABNORMAL HIGH (ref 6–20)
CO2: 28 mmol/L (ref 22–32)
Calcium: 8.9 mg/dL (ref 8.9–10.3)
Chloride: 106 mmol/L (ref 98–111)
Creatinine, Ser: 1.5 mg/dL — ABNORMAL HIGH (ref 0.44–1.00)
GFR calc Af Amer: 47 mL/min — ABNORMAL LOW (ref >60)
GFR calc non Af Amer: 40 mL/min — ABNORMAL LOW (ref >60)
Glucose, Bld: 129 mg/dL — ABNORMAL HIGH (ref 70–99)
Potassium: 3.8 mmol/L (ref 3.5–5.1)
Sodium: 142 mmol/L (ref 135–145)

## 2019-08-28 MED ORDER — MORPHINE SULFATE 15 MG PO TABS
15.0000 mg | ORAL_TABLET | Freq: Four times a day (QID) | ORAL | Status: DC | PRN
  Administered 2019-08-28 – 2019-08-29 (×2): 15 mg via ORAL
  Filled 2019-08-28 (×2): qty 1

## 2019-08-28 MED ORDER — HYDROMORPHONE HCL 2 MG PO TABS: 2.0000 mg | ORAL_TABLET | ORAL | Status: DC | PRN

## 2019-08-28 MED ORDER — TRAMADOL HCL 50 MG PO TABS
50.0000 mg | ORAL_TABLET | Freq: Four times a day (QID) | ORAL | Status: DC | PRN
  Administered 2019-08-28: 50 mg via ORAL
  Filled 2019-08-28: qty 1

## 2019-08-28 MED ORDER — INSULIN GLARGINE 100 UNIT/ML ~~LOC~~ SOLN
37.0000 [IU] | Freq: Every day | SUBCUTANEOUS | Status: DC
  Administered 2019-08-28: 37 [IU] via SUBCUTANEOUS
  Filled 2019-08-28 (×2): qty 0.37

## 2019-08-28 NOTE — Progress Notes (Signed)
Occupational Therapy Treatment Patient Details Name: [name] MRN: [id number] DOB: [birth year] Today's Date: 08/28/2019    History of present illness Pt is a 49 y/o female transferred from [facility] for completion of care. Pt has a PMH including but not limited to hepatic mass prior negative by biopsy status post ablation with resolution morbid obesity, lupus, Sjogren's syndrome, Combined systolic/diastolic HF, diabetes mellitus type 2 with gastroparesis, history of CVA, hyperlipidemia, OSA  Admitted for treatment of severe abdominal pain status post embolectomy.   OT comments  Pt will need inpatient rehab.  Plans to speak with her family about DC options  Follow Up Recommendations  CIR;Supervision/Assistance - 24 hour    Equipment Recommendations  3 in 1 bedside commode;Wheelchair (measurements OT);Wheelchair cushion (measurements OT);Other (comment)(bariatric)    Recommendations for Other Services      Precautions / Restrictions Precautions Precautions: Fall       Mobility Bed Mobility Overal bed mobility: Needs Assistance Bed Mobility: Supine to Sit;Sit to Supine     Supine to sit: HOB elevated;Mod assist Sit to supine: Mod assist;HOB elevated;+2 for physical assistance      Transfers                 General transfer comment: declined standing due to fatigue    Balance Overall balance assessment: Needs assistance Sitting-balance support: Feet supported;No upper extremity supported Sitting balance-Leahy Scale: Fair         Standing balance comment: did not stand                           ADL either performed or assessed with clinical judgement   ADL Overall ADL's : Needs assistance/impaired Eating/Feeding: Set up;Sitting   Grooming: Wash/dry hands;Wash/dry face;Oral care;Sitting;Minimal assistance                                 General ADL Comments: pt with VERY flat affect.  Will need post acute rehab     Vision Patient  Visual Report: No change from baseline            Cognition Arousal/Alertness: Awake/alert Behavior During Therapy: Flat affect Overall Cognitive Status: Within Functional Limits for tasks assessed                                 General Comments: VERY flat affect                   Pertinent Vitals/ Pain       Pain Assessment: No/denies pain     Prior Functioning/Environment              Frequency  Min 2X/week        Progress Toward Goals  OT Goals(current goals can now be found in the care plan section)  Progress towards OT goals: Progressing toward goals     Plan Discharge plan remains appropriate       AM-PAC OT "6 Clicks" Daily Activity     Outcome Measure   Help from another person eating meals?: A Little Help from another person taking care of personal grooming?: A Little Help from another person toileting, which includes using toliet, bedpan, or urinal?: A Lot Help from another person bathing (including washing, rinsing, drying)?: A Lot Help from another person to put on and taking off regular  upper body clothing?: A Lot Help from another person to put on and taking off regular lower body clothing?: A Lot 6 Click Score: 14    End of Session Equipment Utilized During Treatment: Oxygen  OT Visit Diagnosis: Unsteadiness on feet (R26.81);Other abnormalities of gait and mobility (R26.89);Muscle weakness (generalized) (M62.81);Pain Pain - part of body: (abdomen)   Activity Tolerance Patient tolerated treatment well   Patient Left in bed;with call bell/phone within reach;with bed alarm set   Nurse Communication Mobility status        Time: [id number] OT Time Calculation (min): 29 min  Charges: OT General Charges $OT Visit: 1 Visit OT Treatments $Self Care/Home Management : 23-37 mins  Kari Baars, [place] [phone] Office- [place] 08/28/2019, 5:25  PM

## 2019-08-28 NOTE — Progress Notes (Addendum)
Inpatient Rehabilitation-Admissions Coordinator   Still waiting to hear back from pt's family regarding support at DC. Met with pt bedside to review CIR program and determine interest. She has now expressed a desire to return home with [facility] therapy. We reviewed the differences in intensity of HH therapy vs IP Rehab. Pt would like to think about her options for a little bit longer. Will check back with pt this afternoon for decision.   Raechel Ache, OTR/L  Rehab Admissions Coordinator  [phone] 08/28/2019 11:50 AM  ADDENDUM 3:05PM: AC and LCSW met with pt at the bedside to discuss rehab options with pt. Pt still not ready to commit to CIR at this time and wants to have her whole family weigh in on the decision. *Still need confirmed family support at DC before I can offer this patient a bed. Will follow up tomorrow.  Raechel Ache, OTR/L  Rehab Admissions Coordinator  [phone] 08/28/2019 3:07 PM

## 2019-08-28 NOTE — PMR Pre-admission (Signed)
PMR Admission Coordinator Pre-Admission Assessment  Patient: [name] is an 49 y.o., female MRN: [id number] DOB: 04/04/1971 Height: 5' 8"  (172.7 cm) Weight: (!) 193.4 kg  Insurance Information HMO:     PPO:      PCP:      IPA:      80/20:      OTHER:  PRIMARY: Medicaid [place]      Policy#: [id number]      Subscriber: Patient Coverage Code: [id number] CM Name:       Phone#:      Fax#:  Pre-Cert#:       Employer:  Benefits:  Phone #: verified via automated number: [phone]     Name: verified eligibility online via [place] on 08/27/19  Eff. Date: verified eligibility 08/27/19     Deduct:       Out of Pocket Max:       Life Max:  CIR: Covered per Medicaid guidelines      SNF:  Outpatient:      Co-Pay:  Home Health:       Co-Pay:  DME:      Co-Pay:  Providers:  SECONDARY:       Policy#:       Subscriber: CM Name:       Phone#:      Fax#:  Pre-Cert#:       Employer:  Benefits:  Phone #:      Name:  Eff. Date:      Deduct:       Out of Pocket Max:       Life Max: CIR:       SNF:  Outpatient:      Co-Pay:  Home Health:       Co-Pay:  DME:      Co-Pay:   Medicaid Application Date:       Case Manager: Disability Application Date:       Case Worker:   The "Data Collection Information Summary" for patients in Inpatient Rehabilitation Facilities with attached "Privacy Act [place] Records" was provided and verbally reviewed with: N/A  Emergency Contact Information Contact Information    Name Relation Home Work Mobile   [place] Son [place]   Arzu, [place]gaug[place] Sister   [phone]   Leliana, [place] Sister   [phone]      Current Medical History  Patient Admitting Diagnosis: Debility  History of Present Illness: Pt is a 49 yo female with history of morbid obesity, Sjogren's syndrome, lupus, systolic heart failure, type 2 DM, gastroparesis, dyslipidemia, history of CVA, and obstructive sleep apnea. Pt initially presented to [facility] on 08/12/19 with a two  day span of nausea, vomiting, and abdominal pain. Her workup revealed DKA and hemorrhagic shock due to bleeding hepatic adenoma and resulting AKI. On 08/18/19 pt was transferred to  [facility] for further management where she underwent an embolization of the right lobe adenomas. Pt did have post op pain with need for PCA pump and was transferred back to [facility] of 08/23/19 for further management. Pt has weaned off pain pump and been evaluated by therapies with recommendation for CIR. Pt is to admit to CIR on 08/29/19.    Patient's medical record from [facility] has been reviewed by the rehabilitation admission coordinator and physician.  Past Medical History  Past Medical History:  Diagnosis Date  . Acute renal failure (HCC)     Intractable nausea vomiting secondary to diabetic gastroparesis causing dehydration and acute renal failure /  notes 04/01/2013  . Anginal pain ([place])   . Anxiety   . Bell's palsy 08/09/2010  . CAP (community acquired pneumonia) 10/2011   Archie Endo 11/08/2011  . Chest pain 01/13/2014  . CHF (congestive heart failure) ([place])   . Diabetic gastroparesis associated with type 2 diabetes mellitus ([place])    this is presumed diagnoses, not confirmed by any studies.   . Eczema   . Family history of malignant neoplasm of breast   . Family history of malignant neoplasm of ovary   . GERD (gastroesophageal reflux disease)   . High cholesterol   . Hypertension   . Liver lesion 08/13/2019  . Liver mass 2014   biopsied 03/2013 at [facility], not malignant.  is to undergo radiologic ablation of the mass in sept/October 2014.   . Lupus ([place])   . Migraines    "maybe a couple times/yr" (04/01/2013)  . Obesity   . Obstructive sleep apnea on CPAP 2011  . On home oxygen therapy    "2L 24/7" (02/28/2015)  . SJOGREN'S SYNDROME 08/09/2010  . SLE (systemic lupus erythematosus) ([place])    Archie Endo 12/01/2010  . Stroke [facility]) 2010; 10/2012   "left side is still weak from it, never fully  regained full strength; no additions from stroke 10/2012"    Family History   family history includes Arthritis in an other family member; Asthma in her brother, brother, and mother; Breast cancer in her cousin and maternal aunt; Breast cancer (age of onset: 84) in her maternal aunt; Cancer in her maternal aunt, maternal aunt, and paternal aunt; Diabetes in her mother; Hypertension in her mother and another family member; Ovarian cancer in her maternal aunt; Ovarian cancer (age of onset: 55) in her sister; Prostate cancer in her maternal uncle and paternal uncle; Stomach cancer in her maternal uncle; Stroke in an other family member; Urticaria in her mother.  Prior Rehab/Hospitalizations Has the patient had prior rehab or hospitalizations prior to admission? No  Has the patient had major surgery during 100 days prior to admission? No   Current Medications  Current Facility-Administered Medications:  .  0.9 %  sodium chloride infusion, 250 mL, Intravenous, PRN, [name], [name], MD .  acetaminophen (TYLENOL) tablet 650 mg, 650 mg, Oral, Q6H PRN **OR** acetaminophen (TYLENOL) suppository 650 mg, 650 mg, Rectal, Q6H PRN, [name], [name], MD .  albuterol (PROVENTIL) (2.5 MG/3ML) 0.083% nebulizer solution 2.5 mg, 2.5 mg, Inhalation, Q6H PRN, [name], [name], MD .  ALPRAZolam Duanne Moron) tablet 0.5 mg, 0.5 mg, Oral, BID PRN, [name], [name], MD .  amLODipine (NORVASC) tablet 5 mg, 5 mg, Oral, Daily, [name], [name], MD, 5 mg at 08/29/19 1104 .  buPROPion (WELLBUTRIN XL) 24 hr tablet 300 mg, 300 mg, Oral, Daily, [name], [name], MD, 300 mg at 08/29/19 1104 .  carvedilol (COREG) tablet 12.5 mg, 12.5 mg, Oral, BID WC, [name], [name], MD, 12.5 mg at 08/29/19 1106 .  DULoxetine (CYMBALTA) DR capsule 120 mg, 120 mg, Oral, QHS, [name], [name], MD, 120 mg at 08/28/19 2102 .  famotidine (PEPCID) tablet 20 mg, 20 mg, Oral, BID, [name], [name], MD, 20 mg at 08/29/19 1105 .   hydroxychloroquine (PLAQUENIL) tablet 200 mg, 200 mg, Oral, BID, [name], [name], MD, 200 mg at 08/29/19 1120 .  insulin aspart (novoLOG) injection 0-15 Units, 0-15 Units, Subcutaneous, Q4H, [name], [name], MD, 2 Units at 08/29/19 1128 .  insulin glargine (LANTUS) injection 37 Units, 37 Units, Subcutaneous, QHS, Donnamae Jude, MD, 37 Units at 08/28/19 2057 .  isosorbide-hydrALAZINE (  BIDIL) 20-37.5 MG per tablet 2 tablet, 2 tablet, Oral, TID, [name], [name], MD, 2 tablet at 08/29/19 1104 .  levothyroxine (SYNTHROID) tablet 50 mcg, 50 mcg, Oral, Q0600, Osei-Bonsu, Iona Beard, MD, 50 mcg at 08/29/19 0558 .  loratadine (CLARITIN) tablet 10 mg, 10 mg, Oral, Daily, [name], [name], MD, 10 mg at 08/29/19 1105 .  mometasone-formoterol (DULERA) 100-5 MCG/ACT inhaler 2 puff, 2 puff, Inhalation, BID, Arrien, Jimmy Picket, MD, 2 puff at 08/29/19 0906 .  morphine (MS CONTIN) 12 hr tablet 15 mg, 15 mg, Oral, Q12H, Arrien, Jimmy Picket, MD, 15 mg at 08/29/19 1105 .  morphine (MSIR) tablet 15 mg, 15 mg, Oral, Q6H PRN, Donnamae Jude, MD, 15 mg at 08/28/19 1633 .  multivitamin with minerals tablet 1 tablet, 1 tablet, Oral, Daily, Osei-Bonsu, George, MD, 1 tablet at 08/29/19 1104 .  ondansetron (ZOFRAN) tablet 4 mg, 4 mg, Oral, Q6H PRN **OR** ondansetron (ZOFRAN) injection 4 mg, 4 mg, Intravenous, Q6H PRN, [name], [name], MD .  oxybutynin (DITROPAN XL) 24 hr tablet 15 mg, 15 mg, Oral, Daily, [name], [name], MD, 15 mg at 08/29/19 1120 .  pravastatin (PRAVACHOL) tablet 10 mg, 10 mg, Oral, Daily, [name], [name], MD, 10 mg at 08/29/19 1105 .  predniSONE (DELTASONE) tablet 3 mg, 3 mg, Oral, Daily, [name], [name], MD, 3 mg at 08/29/19 1119 .  pregabalin (LYRICA) capsule 75 mg, 75 mg, Oral, TID, [name], [name], MD, 75 mg at 08/29/19 1103 .  protein supplement (ENSURE MAX) liquid, 11 oz, Oral, BID, Osei-Bonsu, George, MD, 11 oz at 08/29/19 1107 .  sacubitril-valsartan (ENTRESTO)  97-103 mg per tablet, 1 tablet, Oral, BID, [name], [name], MD, 1 tablet at 08/29/19 1120 .  sodium chloride flush (NS) 0.9 % injection 3 mL, 3 mL, Intravenous, Q12H, [name], [name], MD, 3 mL at 08/29/19 1121 .  sodium chloride flush (NS) 0.9 % injection 3 mL, 3 mL, Intravenous, PRN, [name], [name], MD .  sucralfate (CARAFATE) 1 GM/10ML suspension 1 g, 1 g, Oral, BID, [name], [name], MD, 1 g at 08/29/19 1107 .  topiramate (TOPAMAX) tablet 75 mg, 75 mg, Oral, QHS, [name], [name], MD, 75 mg at 08/28/19 2102 .  traMADol (ULTRAM) tablet 50-100 mg, 50-100 mg, Oral, Q6H PRN, Donnamae Jude, MD, 50 mg at 08/28/19 1418  Patients Current Diet:  Diet Order            Diet Carb Modified Fluid consistency: Thin; Room service appropriate? Yes  Diet effective now              Precautions / Restrictions Precautions Precautions: Fall Precaution Comments: PCA pump Restrictions Weight Bearing Restrictions: No   Has the patient had 2 or more falls or a fall with injury in the past year? No  Prior Activity Level Community (5-7x/wk): not working but Independent in ADLs and drove PTA. pt would run her own errands. She did have an aide 53 hrs/month mostly for IADLs.   Prior Functional Level Self Care: Did the patient need help bathing, dressing, using the toilet or eating? Independent  Indoor Mobility: Did the patient need assistance with walking from room to room (with or without device)? Independent  Stairs: Did the patient need assistance with internal or external stairs (with or without device)? Independent  Functional Cognition: Did the patient need help planning regular tasks such as shopping or remembering to take medications? [place] / [place] Devices/Equipment: Cane (specify quad or straight), Walker (specify type) Home Equipment: Shower seat, Hand held  shower head, Bedside commode, Cane - single point, Walker - 4  wheels  Prior Device Use: Indicate devices/aids used by the patient prior to current illness, exacerbation or injury? None of the above  Current Functional Level Cognition  Overall Cognitive Status: Within Functional Limits for tasks assessed Orientation Level: Oriented X4 General Comments: VERY flat affect    Extremity Assessment (includes Sensation/Coordination)  Upper Extremity Assessment: Generalized weakness  Lower Extremity Assessment: Generalized weakness    ADLs  Overall ADL's : Needs assistance/impaired Eating/Feeding: Set up, Sitting Grooming: Wash/dry hands, Wash/dry face, Oral care, Sitting, Minimal assistance Upper Body Bathing: Sitting, Moderate assistance Lower Body Bathing: Maximal assistance, Sit to/from stand Upper Body Dressing : Moderate assistance, Sitting Lower Body Dressing: Maximal assistance, Sit to/from stand, Sitting/lateral leans Toilet Transfer: Maximal assistance, +2 for physical assistance, +2 for safety/equipment, Stand-pivot Toilet Transfer Details (indicate cue type and reason): max A for sit <> stand only this date Toileting- Clothing Manipulation and Hygiene: Maximal assistance, Sit to/from stand Functional mobility during ADLs: Maximal assistance(max A to pull up on back of recliner for sit <> stand only) General ADL Comments: pt with VERY flat affect.  Will need post acute rehab    Mobility  Overal bed mobility: Needs Assistance Bed Mobility: Supine to Sit, Sit to Supine Rolling: Mod assist, +2 for physical assistance Supine to sit: HOB elevated, Mod assist Sit to supine: Mod assist, HOB elevated, +2 for physical assistance General bed mobility comments: min A at trunk for supine to sit, once pt slid LE's off bed. Min A to position squarely at EOB. Mod A for LE's for return to supine. Pt has difficulty moving in current bed due to body habitus, would benefit from wider bed.     Transfers  Overall transfer level: Needs assistance Equipment  used: Rolling walker (2 wheeled) Transfer via Lift Equipment: (back of recliner) Transfers: Sit to/from Stand Sit to Stand: +2 physical assistance, From elevated surface, Min assist General transfer comment: declined standing due to fatigue    Ambulation / Gait / Stairs / Emergency planning/management officer  Ambulation/Gait Ambulation/Gait assistance: +2 safety/equipment, Min assist Gait Distance (Feet): 4 Feet Assistive device: Rolling walker (2 wheeled) Gait Pattern/deviations: Wide base of support, Trunk flexed, Shuffle General Gait Details: pt has difficulty stepping feet, improved with cueing and needed continued encouragement Gait velocity: decr Gait velocity interpretation: <1.31 ft/sec, indicative of household ambulator    Posture / Balance Balance Overall balance assessment: Needs assistance Sitting-balance support: Feet supported, No upper extremity supported Sitting balance-Leahy Scale: Fair Standing balance support: During functional activity Standing balance-Leahy Scale: Poor Standing balance comment: did not stand    Special needs/care consideration BiPAP/CPAP : yes, CPAP CPM : no Continuous Drip IV : no Dialysis : no        Days : no Life Vest : no Oxygen : on RA Special Bed : no Trach Size : no Wound Vac (area) : no      Location : no Skin: cracking to right and breast, groin (right; left); MASD to breast, groin (right; left); wound to left and right breast, wound to mid, lower sternum.  Bowel mgmt: last BM 08/24/19, continent Bladder mgmt: continent, external catheter in place Diabetic mgmt: yes Behavioral consideration : delayed responses Chemo/radiation : no   Previous Home Environment (from acute therapy documentation) Living Arrangements: Non-relatives/Friends Available Help at Discharge: Family, Available PRN/intermittently Type of Home: House Home Layout: One level Home Access: Stairs to enter Entrance Stairs-Rails: Left Entrance Stairs-Number of Steps:  8 Bathroom Shower/Tub: Chiropodist: Handicapped [place]: Yes Type of Home Care Services: Home RN  Discharge Living Setting Plans for Discharge Living Setting: Patient's home, Lives with (comment)(lives with roommate Lenny ) Type of Home at Discharge: House Discharge Home Layout: One level Discharge Home Access: Stairs to enter Entrance Stairs-Rails: Can reach both Entrance Stairs-Number of Steps: 3 steps then landing then 4 steps Discharge Bathroom Shower/Tub: Tub/shower unit Discharge Bathroom Toilet: Standard Discharge Bathroom Accessibility: Yes How Accessible: Accessible via walker Does the patient have any problems obtaining your medications?: No  Social/Family/Support Systems Patient Roles: Other (Comment)(has family close that lives nearby; has roommate. ) Sport and exercise psychologist Information: Tawnya Crook (sister): [phone] Anticipated Caregiver: sister Darin Engels (roommate), neice Merlyn Lot, cousin Lelan Pons Anticipated Ambulance person Information: see above for Teasha's number Ability/Limitations of Caregiver: Min A Caregiver Availability: 24/7 Discharge Plan Discussed with Primary Caregiver: Gwyndolyn Saxon) Is Caregiver In Agreement with Plan?: Yes Does Caregiver/Family have Issues with Lodging/Transportation while Pt is in Rehab?: No  Goals/Additional Needs Patient/Family Goal for Rehab: PT/OT: Supervision, SLP: NA  Expected length of stay: 7-10 days  Cultural Considerations: TBD Dietary Needs: carb modified, thin liquids; calorie level medium: 1600-2000. Equipment Needs: TBD Special Service Needs: bariatric equipment Pt/Family Agrees to Admission and willing to participate: Yes Program Orientation Provided & Reviewed with Pt/Caregiver Including Roles  & Responsibilities: Yes(pt and her sister Tawnya Crook)  Barriers to Discharge: Home environment access/layout, Insurance for SNF coverage, Weight  Barriers to Discharge Comments: steps to enter home.  while on Acute side, no agency had accepted pt for [facility] services as backup-may be difficult to find home health services  Decrease burden of Care through IP rehab admission: NA  Possible need for SNF placement upon discharge: Not anticipated. Pt has good social support per family for assistance at DC. Anticipate pt can get back to Supervision level through CIR stay and return home safely.   Patient Condition: I have reviewed medical records from [facility], spoken with CSW, and patient and family member. I met with patient at the bedside for inpatient rehabilitation assessment.  Patient will benefit from ongoing PT and OT, can actively participate in 3 hours of therapy a day 5 days of the week, and can make measurable gains during the admission.  Patient will also benefit from the coordinated team approach during an Inpatient Acute Rehabilitation admission.  The patient will receive intensive therapy as well as Rehabilitation physician, nursing, social worker, and care management interventions.  Due to safety, skin/wound care, disease management, medication administration, pain management and patient education the patient requires 24 hour a day rehabilitation nursing.  The patient is currently Min A + 2 for 4 feet with RW with mobility and Mod A to Max A + 2 for basic ADLs.  Discharge setting and therapy post discharge at home with home health is anticipated.  Patient has agreed to participate in the Acute Inpatient Rehabilitation Program and will admit 08/29/19.  Preadmission Screen Completed By:  Raechel Ache, 08/29/2019 12:01 PM ______________________________________________________________________   Discussed status with Dr. Dagoberto Ligas on 08/29/19 at 9:36AM and received approval for admission today.  Admission Coordinator:  Raechel Ache, OT, time 9:36AM/Date 08/29/19   Assessment/Plan: Diagnosis: 1. Does the need for close, 24 hr/day Medical supervision in concert with the patient's rehab  needs make it unreasonable for this patient to be served in a less intensive setting? Yes 2. Co-Morbidities requiring supervision/potential complications: extreme morbid obesity with BMI of 65, Systolic and diastolic  CHF, DM with A1c of 8.6- came in to hospital with DKA and AKI; obesity hypoventilation syndrome; hx of CVA; OSA on BiPAP, lupus, sjogren's disease 3. Due to bladder management, bowel management, skin/wound care, disease management, medication administration, pain management and patient education, does the patient require 24 hr/day rehab nursing? Yes 4. Does the patient require coordinated care of a physician, rehab nurse, PT, OT, and SLP to address physical and functional deficits in the context of the above medical diagnosis(es)? Yes Addressing deficits in the following areas: balance, endurance, locomotion, strength, transferring, bathing, dressing, feeding, grooming, toileting and psychosocial support 5. Can the patient actively participate in an intensive therapy program of at least 3 hrs of therapy 5 days a week? Yes 6. The potential for patient to make measurable gains while on inpatient rehab is fair 7. Anticipated functional outcomes upon discharge from inpatient rehab: supervision PT, supervision OT, n/a SLP 8. Estimated rehab length of stay to reach the above functional goals is: 7-10 days 9. Anticipated discharge destination: Home 10. Overall Rehab/Functional Prognosis: fair   MD Signature:

## 2019-08-28 NOTE — Progress Notes (Signed)
Nutrition Follow-up  DOCUMENTATION CODES:   Morbid obesity  INTERVENTION:   -Continue Ensure Max po BID, each supplement provides 150 kcal and 30 grams of protein.  -Continue MVI with minerals daily  NUTRITION DIAGNOSIS:   Increased nutrient needs related to chronic illness(CHF, DM) as evidenced by estimated needs.  Ongoing  GOAL:   Patient will meet greater than or equal to 90% of their needs  Progressing   MONITOR:   PO intake, Supplement acceptance, Labs, Weight trends, Skin, I & O's  REASON FOR ASSESSMENT:   Consult, Malnutrition Screening Tool Assessment of nutrition requirement/status  ASSESSMENT:   [name] is a 49 y.o. female with medical history significant of hepatic mass prior negative by biopsy status post ablation with resolution morbid obesity, lupus, Sjogren's syndrome, Combined systolic/diastolic HF, diabetes mellitus type 2 with gastroparesis, history of CVA, hyperlipidemia, OSA  1/20- s/p embolization of rt lobe adenomas at [facility]  Reviewed I/O's: +420 ml x 24 hours and 809 ml x 24 hours  UOP: 900 ml x 24 hours  Pt on phone, eating lunch at time of visit. Pt with great appetite; noted meal completion 100%. Pt is also consuming Ensure Max supplements per MAR.   Medications reviewed and include prednisone.   Per chart review, pt desires to go home with home health services vs CIR when medically stable. Pt with good family support; pt's sister assists with care.   Labs reviewed: CBGS: 138-275 (inpatient orders for glycemic control are 0-15 units insulin aspart every 4 hours and 35 units insulin glargine daily at bedtime).   NUTRITION - FOCUSED PHYSICAL EXAM:    Most Recent Value  Orbital Region  No depletion  Upper Arm Region  No depletion  Thoracic and Lumbar Region  No depletion  Buccal Region  No depletion  Temple Region  No depletion  Clavicle Bone Region  No depletion  Clavicle and Acromion Bone Region  No depletion  Scapular Bone  Region  No depletion  Dorsal Hand  No depletion  Patellar Region  No depletion  Anterior Thigh Region  No depletion  Posterior Calf Region  No depletion  Edema (RD Assessment)  Moderate  Hair  Reviewed  Eyes  Reviewed  Mouth  Reviewed  Skin  Reviewed  Nails  Reviewed       Diet Order:   Diet Order            Diet Carb Modified Fluid consistency: Thin; Room service appropriate? Yes  Diet effective now              EDUCATION NEEDS:   No education needs have been identified at this time  Skin:  Skin Assessment: Skin Integrity Issues: Skin Integrity Issues:: Other (Comment) Other: MASD breasts and groin  Last BM:  08/24/19  Height:   Ht Readings from Last 1 Encounters:  08/23/19 5\' 8"  (1.727 m)    Weight:   Wt Readings from Last 1 Encounters:  08/28/19 (!) 194.6 kg    Ideal Body Weight:  63.6 kg  BMI:  Body mass index is 65.23 kg/m.  Estimated Nutritional Needs:   Kcal:  2050-2250  Protein:  130-160 grams  Fluid:  2 L    Jesusita Jocelyn A. Jimmye Norman, RD, LDN, [place] Registered Dietitian II Certified Diabetes Care and Education Specialist Pager: [phone] After hours Pager: [phone]

## 2019-08-28 NOTE — Progress Notes (Signed)
Patient complaining of aching pain in right upper abdomen rating at a 10 out of 10. Tramadol 50 mg PO given at 14:18 with no improvement. Kennon Rounds, MD paged regarding patient's pain. Verbal orders received to give PRN dose of morphine (MSIR) 15 mg. Nurse will carry out orders.

## 2019-08-28 NOTE — Progress Notes (Signed)
PROGRESS NOTE    [name]  [id number] DOB: 06-08-1971 DOA: 08/23/2019 PCP: Bartholome Bill, MD    Brief Narrative:  49 year old female who was transferred from [facility]. She does have significant past medical history for morbid obesity, Sjogren's syndrome, lupus, systolic heart failure, type 2 diabetes mellitus, gastroparesis, dyslipidemia, history of CVA and obstructive sleep apnea. Patient initially presented to [facility] August 12, 2019 with 2-day history of nausea, vomiting and abdominal pain. She was diagnosed with DKA and with hemorrhagic shock due to bleeding hepatic adenoma and resultant AKIJanuary 17 patient was transferred to [facility] for further management. January 20 she underwent embolization of right lobe adenomas with improvement of bleeding.  Postprocedure patient continued to have abdominal pain, she was placed on a PCA pump for further pain control and transferred back to [facility](08/23/19)for further pain management.  Patient has been wean off PCU pump with good toleration, physical therapy has recommended inpatient rehabilitation evaluation.   Assessment & Plan:   Principal Problem:   Liver hemorrhage Active Problems:   SLE   SJOGREN'S SYNDROME   HTN (hypertension)   Chronic respiratory failure with hypoxia (HCC)   Morbid obesity (HCC)   Liver masses   Diabetic gastroparesis (HCC)   HLD (hyperlipidemia)   Diabetes (HCC)   Hepatic adenoma   Hemoperitoneum   Acute blood loss anemia   Obesity hypoventilation syndrome (HCC)   Skin breakdown   Essential hypertension  1. Sp bleeding hepatic adenoma/ acute blood loss anemia/ sp embolization 01/20 ([facility]).patient has been transitioned to oral morphine 15 mg bid and as needed IV hydromorphone q2 H, with good toleration. On lyrica and tramadol, she continues to have pain. She is not using the tramadol, may need to increase this. Change Dilaudid to po. Will continue neuro checks per unit  protocol and mobility as tolerated. Will consult inpatient rehab for evaluation. She prefers home with home health, though this is difficult to obtain, will work on it  2. Uncontrolled T2DM (Hgb A1c 8,6)/ dyslipidemia/ sp DKA.Fasting glucose this am is 129, patient tolerating po well, plan to continue insulin sliding scale for glucose cover and monitoring, plus a basal insulin dose of 35 units qhs. Increased Lantus to 37 units. On pravastatin.   3. Heart failure systolic dysfunction. Continue heart failure management with carvedilol and bidil. Blood pressure systolic [id number] mmHg.   4. Hx of CVA. Inpatient rehab consultation.    5. Obesity/ obesity hypoventilation syndrome. On bipap at night with good toleration. Out of bed as tolerated. PT consulted  6. AKI. Renal function with serum cr down to 1.50 from 2.64, K at 3.8 and serum bicarbonate at 28. Will continue close monitoring of renal function and electrolytes, avoid hypotension or nephrotoxic medications.   7. Morbid obesity. Her calculated BMI is 64.9.   8. Anxiety. On alprazolam as needed. Continue with bupropion, duloxetine and topiramate.   9. HTN. On carvedilol, Bidil and amlodipine, for blood pressure control.    10. SLE. On plaquenil and prednisone. No current signs of acute flare.   107. Hypothyroid. On levothyroxine.   12. Skin breakdown at right breast - new Wound care applied  DVT prophylaxis: Lovenox SQ Code Status: Full code  Family Communication: patient Disposition Plan: Wants to go home with home PT   Consultants:   None now Nephrology and GI during last admission  Procedures:  IR embolization at [facility]  Antimicrobials: Anti-infectives (From admission, onward)   Start     Dose/Rate Route  Frequency Ordered Stop   08/23/19 2345  hydroxychloroquine (PLAQUENIL) tablet 200 mg     200 mg Oral 2 times daily 08/23/19 2341         Subjective: Reports continued abdominal pain and pain at right  breast.  Objective: Vitals:   08/27/19 1625 08/27/19 1926 08/27/19 2110 08/28/19 0405  BP: (!) 123/59 (!) 130/58  131/70  Pulse: 78 80  80  Resp:  20  18  Temp:  98.8 F (37.1 C)  97.9 F (36.6 C)  TempSrc:  Oral    SpO2:  100% 98% 100%  Weight:    (!) 194.6 kg  Height:        Intake/Output Summary (Last 24 hours) at 08/28/2019 0827 Last data filed at 08/28/2019 [id number] Gross per 24 hour  Intake 1320 ml  Output 900 ml  Net 420 ml   Filed Weights   08/26/19 0408 08/27/19 0518 08/28/19 0405  Weight: (!) 194.1 kg (!) 193 kg (!) 194.6 kg    Examination:  General exam: Appears calm and comfortable, oxygen in place  Respiratory system: Clear to auscultation. Respiratory effort normal. Cardiovascular system: S1 & S2 heard, RRR.  Gastrointestinal system: Abdomen is nondistended, soft and nontender.  Central nervous system: Alert and oriented. No focal neurological deficits. Extremities: Symmetric  Skin: linear breakdown of the skin under the right breast medially Psychiatry: Judgement and insight appear normal. Mood & affect appropriate.     Data Reviewed: I have personally reviewed following labs and imaging studies  CBC: Recent Labs  Lab 08/23/19 2244 08/24/19 0538 08/25/19 1154 08/25/19 1935 08/26/19 0426  WBC 13.6* 14.6* 11.6* 12.8* 11.1*  NEUTROABS 10.8*  --   --   --   --   HGB 8.9* 8.7* 8.6* 8.5* 8.1*  HCT 29.7* 29.1* 28.7* 27.7* 26.4*  MCV 95.2 95.1 94.4 94.2 94.0  PLT 310 323 317 308 [id number]   Basic Metabolic Panel: Recent Labs  Lab 08/23/19 2244 08/24/19 0538 08/26/19 0426 08/27/19 0528  NA 136 138 138 142  K 3.8 4.0 3.8 3.8  CL 100 102 101 106  CO2 27 29 25 28   GLUCOSE 186* 176* 195* 129*  BUN 12 15 30* 29*  CREATININE 1.04* 0.92 2.64* 1.50*  CALCIUM 8.7* 8.9 8.9 8.9  MG  --  1.8  --   --   PHOS  --  3.0  --   --    GFR: Estimated Creatinine Clearance: 83.2 mL/min (A) (by C-G formula based on SCr of 1.5 mg/dL (H)). Liver Function Tests: Recent  Labs  Lab 08/23/19 2244 08/24/19 0538 08/26/19 0426  AST 85* 64* 25  ALT 86* 75* 44  ALKPHOS 168* 165* 155*  BILITOT 0.9 1.1 0.5  PROT 5.8* 5.8* 5.8*  ALBUMIN 2.3* 2.3* 2.3*   Coagulation Profile: Recent Labs  Lab 08/23/19 2244  INR 1.2   CBG: Recent Labs  Lab 08/27/19 1551 08/27/19 1957 08/28/19 0006 08/28/19 0412 08/28/19 0756  GLUCAP 138* 182* 168* 104* 146*    Recent Results (from the past 240 hour(s))  SARS CORONAVIRUS 2 (TAT 6-24 HRS) Nasopharyngeal Urine, Clean Catch     Status: None   Collection Time: 08/24/19  3:38 AM   Specimen: Urine, Clean Catch; Nasopharyngeal  Result Value Ref Range Status   SARS Coronavirus 2 NEGATIVE NEGATIVE Final    Comment: (NOTE) SARS-CoV-2 target nucleic acids are NOT DETECTED. The SARS-CoV-2 RNA is generally detectable in upper and lower respiratory specimens during the acute phase of  infection. Negative results do not preclude SARS-CoV-2 infection, do not rule out co-infections with other pathogens, and should not be used as the sole basis for treatment or other patient management decisions. Negative results must be combined with clinical observations, patient history, and epidemiological information. The expected result is Negative. Fact Sheet for Patients: SugarRoll.be Fact Sheet for Healthcare Providers: https://www.woods-mathews.com/ This test is not yet approved or cleared by the [place] FDA and  has been authorized for detection and/or diagnosis of SARS-CoV-2 by FDA under an Emergency Use Authorization (EUA). This EUA will remain  in effect (meaning this test can be used) for the duration of the COVID-19 declaration under Section 56 4(b)(1) of the Act, 21 U.S.C. section 360bbb-3(b)(1), unless the authorization is terminated or revoked sooner. Performed at [facility], [place] [address] [place], [place] [address]   Culture, Urine     Status: Abnormal   Collection  Time: 08/24/19  4:21 AM   Specimen: Urine, Clean Catch  Result Value Ref Range Status   Specimen Description URINE, CLEAN CATCH  Final   Special Requests   Final    NONE Performed at [facility], [place] [address], [place], [place] [address]    Culture MULTIPLE SPECIES PRESENT, SUGGEST RECOLLECTION (A)  Final   Report Status 08/25/2019 FINAL  Final      Radiology Studies: No results found.   Scheduled Meds: . amLODipine  5 mg Oral Daily  . buPROPion  300 mg Oral Daily  . carvedilol  12.5 mg Oral BID WC  . DULoxetine  120 mg Oral QHS  . famotidine  20 mg Oral BID  . hydroxychloroquine  200 mg Oral BID  . insulin aspart  0-15 Units Subcutaneous Q4H  . insulin glargine  37 Units Subcutaneous QHS  . isosorbide-hydrALAZINE  2 tablet Oral TID  . levothyroxine  50 mcg Oral Q0600  . loratadine  10 mg Oral Daily  . mometasone-formoterol  2 puff Inhalation BID  . morphine  15 mg Oral Q12H  . multivitamin with minerals  1 tablet Oral Daily  . oxybutynin  15 mg Oral Daily  . pravastatin  10 mg Oral Daily  . predniSONE  3 mg Oral Daily  . pregabalin  75 mg Oral TID  . Ensure Max Protein  11 oz Oral BID  . sacubitril-valsartan  1 tablet Oral BID  . sodium chloride flush  3 mL Intravenous Q12H  . sucralfate  1 g Oral BID  . topiramate  75 mg Oral QHS   Continuous Infusions: . sodium chloride       LOS: 5 days    Donnamae Jude, MD 08/28/2019 8:27 AM [phone] [facility] If 7PM-7AM, please contact night-coverage 08/28/2019, 8:27 AM

## 2019-08-29 ENCOUNTER — Inpatient Hospital Stay (HOSPITAL_COMMUNITY)
Admission: RE | Admit: 2019-08-29 | Discharge: 2019-09-18 | DRG: 945 | Disposition: A | Discharge status: Home Health | Payer: Medicaid Other | Source: Intra-hospital | Attending: Physical Medicine and Rehabilitation | Admitting: Physical Medicine and Rehabilitation

## 2019-08-29 ENCOUNTER — Other Ambulatory Visit: Payer: Self-pay

## 2019-08-29 ENCOUNTER — Encounter (HOSPITAL_COMMUNITY): Payer: Self-pay | Admitting: Physical Medicine and Rehabilitation

## 2019-08-29 DIAGNOSIS — B372 Candidiasis of skin and nail: Secondary | ICD-10-CM | POA: Diagnosis present

## 2019-08-29 DIAGNOSIS — E1159 Type 2 diabetes mellitus with other circulatory complications: Secondary | ICD-10-CM | POA: Diagnosis present

## 2019-08-29 DIAGNOSIS — E1122 Type 2 diabetes mellitus with diabetic chronic kidney disease: Secondary | ICD-10-CM | POA: Diagnosis present

## 2019-08-29 DIAGNOSIS — R5381 Other malaise: Principal | ICD-10-CM | POA: Diagnosis present

## 2019-08-29 DIAGNOSIS — D134 Benign neoplasm of liver: Secondary | ICD-10-CM | POA: Diagnosis present

## 2019-08-29 DIAGNOSIS — Z87891 Personal history of nicotine dependence: Secondary | ICD-10-CM

## 2019-08-29 DIAGNOSIS — I152 Hypertension secondary to endocrine disorders: Secondary | ICD-10-CM | POA: Diagnosis present

## 2019-08-29 DIAGNOSIS — Z8249 Family history of ischemic heart disease and other diseases of the circulatory system: Secondary | ICD-10-CM

## 2019-08-29 DIAGNOSIS — R1900 Intra-abdominal and pelvic swelling, mass and lump, unspecified site: Secondary | ICD-10-CM

## 2019-08-29 DIAGNOSIS — K625 Hemorrhage of anus and rectum: Secondary | ICD-10-CM | POA: Diagnosis not present

## 2019-08-29 DIAGNOSIS — L8990 Pressure ulcer of unspecified site, unspecified stage: Secondary | ICD-10-CM | POA: Diagnosis present

## 2019-08-29 DIAGNOSIS — L89322 Pressure ulcer of left buttock, stage 2: Secondary | ICD-10-CM | POA: Diagnosis present

## 2019-08-29 DIAGNOSIS — I5022 Chronic systolic (congestive) heart failure: Secondary | ICD-10-CM | POA: Diagnosis present

## 2019-08-29 DIAGNOSIS — I10 Essential (primary) hypertension: Secondary | ICD-10-CM | POA: Diagnosis present

## 2019-08-29 DIAGNOSIS — G510 Bell's palsy: Secondary | ICD-10-CM | POA: Diagnosis present

## 2019-08-29 DIAGNOSIS — Z8041 Family history of malignant neoplasm of ovary: Secondary | ICD-10-CM

## 2019-08-29 DIAGNOSIS — D638 Anemia in other chronic diseases classified elsewhere: Secondary | ICD-10-CM | POA: Diagnosis present

## 2019-08-29 DIAGNOSIS — M329 Systemic lupus erythematosus, unspecified: Secondary | ICD-10-CM | POA: Diagnosis present

## 2019-08-29 DIAGNOSIS — E662 Morbid (severe) obesity with alveolar hypoventilation: Secondary | ICD-10-CM | POA: Diagnosis present

## 2019-08-29 DIAGNOSIS — M3500 Sicca syndrome, unspecified: Secondary | ICD-10-CM | POA: Diagnosis present

## 2019-08-29 DIAGNOSIS — E1143 Type 2 diabetes mellitus with diabetic autonomic (poly)neuropathy: Secondary | ICD-10-CM | POA: Diagnosis present

## 2019-08-29 DIAGNOSIS — Z79890 Hormone replacement therapy (postmenopausal): Secondary | ICD-10-CM | POA: Diagnosis not present

## 2019-08-29 DIAGNOSIS — D62 Acute posthemorrhagic anemia: Secondary | ICD-10-CM | POA: Diagnosis present

## 2019-08-29 DIAGNOSIS — E7800 Pure hypercholesterolemia, unspecified: Secondary | ICD-10-CM | POA: Diagnosis present

## 2019-08-29 DIAGNOSIS — Z833 Family history of diabetes mellitus: Secondary | ICD-10-CM

## 2019-08-29 DIAGNOSIS — Z885 Allergy status to narcotic agent status: Secondary | ICD-10-CM

## 2019-08-29 DIAGNOSIS — R269 Unspecified abnormalities of gait and mobility: Secondary | ICD-10-CM | POA: Diagnosis present

## 2019-08-29 DIAGNOSIS — L89312 Pressure ulcer of right buttock, stage 2: Secondary | ICD-10-CM | POA: Diagnosis present

## 2019-08-29 DIAGNOSIS — Z825 Family history of asthma and other chronic lower respiratory diseases: Secondary | ICD-10-CM

## 2019-08-29 DIAGNOSIS — K219 Gastro-esophageal reflux disease without esophagitis: Secondary | ICD-10-CM | POA: Diagnosis present

## 2019-08-29 DIAGNOSIS — F329 Major depressive disorder, single episode, unspecified: Secondary | ICD-10-CM | POA: Diagnosis present

## 2019-08-29 DIAGNOSIS — Z8673 Personal history of transient ischemic attack (TIA), and cerebral infarction without residual deficits: Secondary | ICD-10-CM

## 2019-08-29 DIAGNOSIS — Z880 Allergy status to penicillin: Secondary | ICD-10-CM

## 2019-08-29 DIAGNOSIS — L309 Dermatitis, unspecified: Secondary | ICD-10-CM | POA: Diagnosis present

## 2019-08-29 DIAGNOSIS — Z6841 Body Mass Index (BMI) 40.0 and over, adult: Secondary | ICD-10-CM

## 2019-08-29 DIAGNOSIS — Z79899 Other long term (current) drug therapy: Secondary | ICD-10-CM

## 2019-08-29 DIAGNOSIS — F419 Anxiety disorder, unspecified: Secondary | ICD-10-CM | POA: Diagnosis present

## 2019-08-29 DIAGNOSIS — I130 Hypertensive heart and chronic kidney disease with heart failure and stage 1 through stage 4 chronic kidney disease, or unspecified chronic kidney disease: Secondary | ICD-10-CM | POA: Diagnosis present

## 2019-08-29 DIAGNOSIS — E1169 Type 2 diabetes mellitus with other specified complication: Secondary | ICD-10-CM

## 2019-08-29 DIAGNOSIS — E119 Type 2 diabetes mellitus without complications: Secondary | ICD-10-CM

## 2019-08-29 DIAGNOSIS — Z803 Family history of malignant neoplasm of breast: Secondary | ICD-10-CM

## 2019-08-29 DIAGNOSIS — G4700 Insomnia, unspecified: Secondary | ICD-10-CM | POA: Diagnosis present

## 2019-08-29 DIAGNOSIS — K648 Other hemorrhoids: Secondary | ICD-10-CM | POA: Diagnosis present

## 2019-08-29 DIAGNOSIS — K3184 Gastroparesis: Secondary | ICD-10-CM | POA: Diagnosis present

## 2019-08-29 DIAGNOSIS — N189 Chronic kidney disease, unspecified: Secondary | ICD-10-CM | POA: Diagnosis present

## 2019-08-29 DIAGNOSIS — Z823 Family history of stroke: Secondary | ICD-10-CM

## 2019-08-29 DIAGNOSIS — E039 Hypothyroidism, unspecified: Secondary | ICD-10-CM | POA: Diagnosis present

## 2019-08-29 DIAGNOSIS — E785 Hyperlipidemia, unspecified: Secondary | ICD-10-CM

## 2019-08-29 DIAGNOSIS — K5900 Constipation, unspecified: Secondary | ICD-10-CM | POA: Diagnosis present

## 2019-08-29 DIAGNOSIS — I5043 Acute on chronic combined systolic (congestive) and diastolic (congestive) heart failure: Secondary | ICD-10-CM

## 2019-08-29 DIAGNOSIS — Z800 Family history of malignant neoplasm of digestive organs: Secondary | ICD-10-CM

## 2019-08-29 HISTORY — DX: Bicornate uterus: Q51.3

## 2019-08-29 LAB — COMPREHENSIVE METABOLIC PANEL
ALT: 25 U/L (ref 0–44)
AST: 26 U/L (ref 15–41)
Albumin: 2.4 g/dL — ABNORMAL LOW (ref 3.5–5.0)
Alkaline Phosphatase: 147 U/L — ABNORMAL HIGH (ref 38–126)
Anion gap: 8 (ref 5–15)
BUN: 22 mg/dL — ABNORMAL HIGH (ref 6–20)
CO2: 26 mmol/L (ref 22–32)
Calcium: 9 mg/dL (ref 8.9–10.3)
Chloride: 107 mmol/L (ref 98–111)
Creatinine, Ser: 1.29 mg/dL — ABNORMAL HIGH (ref 0.44–1.00)
GFR calc Af Amer: 56 mL/min — ABNORMAL LOW (ref >60)
GFR calc non Af Amer: 49 mL/min — ABNORMAL LOW (ref >60)
Glucose, Bld: 129 mg/dL — ABNORMAL HIGH (ref 70–99)
Potassium: 4.3 mmol/L (ref 3.5–5.1)
Sodium: 141 mmol/L (ref 135–145)
Total Bilirubin: 0.4 mg/dL (ref 0.3–1.2)
Total Protein: 6 g/dL — ABNORMAL LOW (ref 6.5–8.1)

## 2019-08-29 LAB — GLUCOSE, CAPILLARY
Glucose-Capillary: 107 mg/dL — ABNORMAL HIGH (ref 70–99)
Glucose-Capillary: 122 mg/dL — ABNORMAL HIGH (ref 70–99)
Glucose-Capillary: 132 mg/dL — ABNORMAL HIGH (ref 70–99)
Glucose-Capillary: 143 mg/dL — ABNORMAL HIGH (ref 70–99)
Glucose-Capillary: 144 mg/dL — ABNORMAL HIGH (ref 70–99)
Glucose-Capillary: 154 mg/dL — ABNORMAL HIGH (ref 70–99)
Glucose-Capillary: 94 mg/dL (ref 70–99)

## 2019-08-29 LAB — CBC WITH DIFFERENTIAL/PLATELET
Abs Immature Granulocytes: 0.05 10*3/uL (ref 0.00–0.07)
Basophils Absolute: 0 10*3/uL (ref 0.0–0.1)
Basophils Relative: 0 %
Eosinophils Absolute: 0.5 10*3/uL (ref 0.0–0.5)
Eosinophils Relative: 6 %
HCT: 26.6 % — ABNORMAL LOW (ref 36.0–46.0)
Hemoglobin: 8.3 g/dL — ABNORMAL LOW (ref 12.0–15.0)
Immature Granulocytes: 1 %
Lymphocytes Relative: 25 %
Lymphs Abs: 2.1 10*3/uL (ref 0.7–4.0)
MCH: 27.9 pg (ref 26.0–34.0)
MCHC: 31.2 g/dL (ref 30.0–36.0)
MCV: 89.6 fL (ref 80.0–100.0)
Monocytes Absolute: 0.7 10*3/uL (ref 0.1–1.0)
Monocytes Relative: 9 %
Neutro Abs: 5 10*3/uL (ref 1.7–7.7)
Neutrophils Relative %: 59 %
Platelets: 343 10*3/uL (ref 150–400)
RBC: 2.97 MIL/uL — ABNORMAL LOW (ref 3.87–5.11)
RDW: 14.7 % (ref 11.5–15.5)
WBC: 8.5 10*3/uL (ref 4.0–10.5)
nRBC: 0 % (ref 0.0–0.2)

## 2019-08-29 LAB — MAGNESIUM: Magnesium: 2 mg/dL (ref 1.7–2.4)

## 2019-08-29 LAB — PHOSPHORUS: Phosphorus: 4 mg/dL (ref 2.5–4.6)

## 2019-08-29 MED ORDER — AMLODIPINE BESYLATE 5 MG PO TABS
5.0000 mg | ORAL_TABLET | Freq: Every day | ORAL | Status: DC
  Administered 2019-08-30 – 2019-09-18 (×20): 5 mg via ORAL
  Filled 2019-08-29 (×20): qty 1

## 2019-08-29 MED ORDER — INSULIN ASPART 100 UNIT/ML ~~LOC~~ SOLN
0.0000 [IU] | Freq: Three times a day (TID) | SUBCUTANEOUS | Status: DC
  Administered 2019-08-30 – 2019-09-01 (×4): 3 [IU] via SUBCUTANEOUS
  Administered 2019-09-02: 18:00:00 4 [IU] via SUBCUTANEOUS
  Administered 2019-09-03 – 2019-09-16 (×3): 3 [IU] via SUBCUTANEOUS
  Administered 2019-09-17: 4 [IU] via SUBCUTANEOUS

## 2019-08-29 MED ORDER — ALUM & MAG HYDROXIDE-SIMETH 200-200-20 MG/5ML PO SUSP: 30.0000 mL | ORAL | Status: DC | PRN

## 2019-08-29 MED ORDER — PREGABALIN 75 MG PO CAPS
75.0000 mg | ORAL_CAPSULE | Freq: Three times a day (TID) | ORAL | Status: DC
  Administered 2019-08-29 – 2019-09-18 (×60): 75 mg via ORAL
  Filled 2019-08-29 (×60): qty 1

## 2019-08-29 MED ORDER — ENOXAPARIN SODIUM 40 MG/0.4ML ~~LOC~~ SOLN: 40.0000 mg | SUBCUTANEOUS | Status: DC

## 2019-08-29 MED ORDER — INSULIN GLARGINE 100 UNIT/ML ~~LOC~~ SOLN
37.0000 [IU] | Freq: Every day | SUBCUTANEOUS | Status: DC
  Administered 2019-08-29 – 2019-09-17 (×19): 37 [IU] via SUBCUTANEOUS
  Filled 2019-08-29 (×21): qty 0.37

## 2019-08-29 MED ORDER — MORPHINE SULFATE ER 15 MG PO TBCR
15.0000 mg | EXTENDED_RELEASE_TABLET | Freq: Two times a day (BID) | ORAL | Status: DC
  Administered 2019-08-29 – 2019-09-06 (×16): 15 mg via ORAL
  Filled 2019-08-29 (×16): qty 1

## 2019-08-29 MED ORDER — DIPHENHYDRAMINE HCL 12.5 MG/5ML PO ELIX: 12.5000 mg | ORAL_SOLUTION | Freq: Four times a day (QID) | ORAL | Status: DC | PRN

## 2019-08-29 MED ORDER — MORPHINE SULFATE ER 15 MG PO TBCR: 15.0000 mg | EXTENDED_RELEASE_TABLET | Freq: Two times a day (BID) | ORAL | 0 refills | Status: DC

## 2019-08-29 MED ORDER — ONDANSETRON HCL 4 MG PO TABS: 4.0000 mg | ORAL_TABLET | Freq: Four times a day (QID) | ORAL | 0 refills | Status: DC | PRN

## 2019-08-29 MED ORDER — ENSURE MAX PROTEIN PO LIQD: 11.0000 [oz_av] | Freq: Two times a day (BID) | ORAL | Status: DC

## 2019-08-29 MED ORDER — ENSURE MAX PROTEIN PO LIQD
11.0000 [oz_av] | Freq: Two times a day (BID) | ORAL | Status: DC
  Administered 2019-08-29 – 2019-09-05 (×10): 11 [oz_av] via ORAL
  Filled 2019-08-29 (×41): qty 330

## 2019-08-29 MED ORDER — CETAPHIL MOISTURIZING EX LOTN
TOPICAL_LOTION | Freq: Two times a day (BID) | CUTANEOUS | Status: DC
  Filled 2019-08-29: qty 473

## 2019-08-29 MED ORDER — LEVOTHYROXINE SODIUM 50 MCG PO TABS: 50.0000 ug | ORAL_TABLET | Freq: Every day | ORAL | Status: AC

## 2019-08-29 MED ORDER — SODIUM CHLORIDE 0.9% FLUSH
3.0000 mL | INTRAVENOUS | Status: DC | PRN
  Administered 2019-09-17: 3 mL via INTRAVENOUS

## 2019-08-29 MED ORDER — TOPIRAMATE 25 MG PO TABS
75.0000 mg | ORAL_TABLET | Freq: Every day | ORAL | Status: DC
  Administered 2019-08-29 – 2019-09-17 (×20): 75 mg via ORAL
  Filled 2019-08-29 (×21): qty 3

## 2019-08-29 MED ORDER — GUAIFENESIN-DM 100-10 MG/5ML PO SYRP: 5.0000 mL | ORAL_SOLUTION | Freq: Four times a day (QID) | ORAL | Status: DC | PRN

## 2019-08-29 MED ORDER — MORPHINE SULFATE 15 MG PO TABS
15.0000 mg | ORAL_TABLET | Freq: Four times a day (QID) | ORAL | Status: DC | PRN
  Administered 2019-09-02 – 2019-09-17 (×11): 15 mg via ORAL
  Filled 2019-08-29 (×12): qty 1

## 2019-08-29 MED ORDER — ACETAMINOPHEN 325 MG PO TABS
325.0000 mg | ORAL_TABLET | ORAL | Status: DC | PRN
  Administered 2019-09-05: 21:00:00 650 mg via ORAL
  Filled 2019-08-29 (×2): qty 2

## 2019-08-29 MED ORDER — MORPHINE SULFATE 15 MG PO TABS: 15.0000 mg | ORAL_TABLET | Freq: Four times a day (QID) | ORAL | 0 refills | Status: DC | PRN

## 2019-08-29 MED ORDER — OXYBUTYNIN CHLORIDE ER 15 MG PO TB24
15.0000 mg | ORAL_TABLET | Freq: Every day | ORAL | Status: DC
  Administered 2019-08-30 – 2019-09-18 (×20): 15 mg via ORAL
  Filled 2019-08-29 (×20): qty 1

## 2019-08-29 MED ORDER — SACUBITRIL-VALSARTAN 97-103 MG PO TABS: 1.0000 | ORAL_TABLET | Freq: Two times a day (BID) | ORAL | 0 refills | Status: DC

## 2019-08-29 MED ORDER — DULOXETINE HCL 60 MG PO CPEP
120.0000 mg | ORAL_CAPSULE | Freq: Every day | ORAL | Status: DC
  Administered 2019-08-29 – 2019-09-17 (×20): 120 mg via ORAL
  Filled 2019-08-29 (×20): qty 2

## 2019-08-29 MED ORDER — BUPROPION HCL ER (XL) 300 MG PO TB24
300.0000 mg | ORAL_TABLET | Freq: Every day | ORAL | Status: DC
  Administered 2019-08-30 – 2019-09-18 (×20): 300 mg via ORAL
  Filled 2019-08-29 (×20): qty 1

## 2019-08-29 MED ORDER — TRAMADOL HCL 50 MG PO TABS
50.0000 mg | ORAL_TABLET | Freq: Four times a day (QID) | ORAL | Status: DC | PRN
  Administered 2019-09-03 – 2019-09-04 (×2): 50 mg via ORAL
  Administered 2019-09-05 – 2019-09-06 (×2): 100 mg via ORAL
  Administered 2019-09-07: 50 mg via ORAL
  Filled 2019-08-29: qty 2
  Filled 2019-08-29: qty 1
  Filled 2019-08-29 (×2): qty 2
  Filled 2019-08-29 (×2): qty 1

## 2019-08-29 MED ORDER — FAMOTIDINE 20 MG PO TABS
20.0000 mg | ORAL_TABLET | Freq: Two times a day (BID) | ORAL | Status: DC
  Administered 2019-08-29 – 2019-09-18 (×40): 20 mg via ORAL
  Filled 2019-08-29 (×41): qty 1

## 2019-08-29 MED ORDER — POLYETHYLENE GLYCOL 3350 17 G PO PACK: 17.0000 g | PACK | Freq: Every day | ORAL | Status: DC | PRN

## 2019-08-29 MED ORDER — PROCHLORPERAZINE EDISYLATE 10 MG/2ML IJ SOLN: 5.0000 mg | Freq: Four times a day (QID) | INTRAMUSCULAR | Status: DC | PRN

## 2019-08-29 MED ORDER — LORATADINE 10 MG PO TABS: 10.0000 mg | ORAL_TABLET | Freq: Every day | ORAL | Status: DC

## 2019-08-29 MED ORDER — SACUBITRIL-VALSARTAN 97-103 MG PO TABS
1.0000 | ORAL_TABLET | Freq: Two times a day (BID) | ORAL | Status: DC
  Administered 2019-08-29 – 2019-09-18 (×40): 1 via ORAL
  Filled 2019-08-29 (×41): qty 1

## 2019-08-29 MED ORDER — ISOSORB DINITRATE-HYDRALAZINE 20-37.5 MG PO TABS
2.0000 | ORAL_TABLET | Freq: Three times a day (TID) | ORAL | Status: DC
  Administered 2019-08-29 – 2019-09-18 (×59): 2 via ORAL
  Filled 2019-08-29 (×61): qty 2

## 2019-08-29 MED ORDER — LORATADINE 10 MG PO TABS
10.0000 mg | ORAL_TABLET | Freq: Every day | ORAL | Status: DC
  Administered 2019-08-29 – 2019-09-18 (×21): 10 mg via ORAL
  Filled 2019-08-29 (×21): qty 1

## 2019-08-29 MED ORDER — SUCRALFATE 1 GM/10ML PO SUSP
1.0000 g | Freq: Two times a day (BID) | ORAL | Status: DC
  Administered 2019-08-29 – 2019-09-18 (×40): 1 g via ORAL
  Filled 2019-08-29 (×41): qty 10

## 2019-08-29 MED ORDER — ADULT MULTIVITAMIN W/MINERALS CH: 1.0000 | ORAL_TABLET | Freq: Every day | ORAL | Status: DC

## 2019-08-29 MED ORDER — LEVOTHYROXINE SODIUM 50 MCG PO TABS
50.0000 ug | ORAL_TABLET | Freq: Every day | ORAL | Status: DC
  Administered 2019-08-30 – 2019-09-12 (×14): 50 ug via ORAL
  Filled 2019-08-29 (×14): qty 1

## 2019-08-29 MED ORDER — ALPRAZOLAM 0.5 MG PO TABS: 0.5000 mg | ORAL_TABLET | Freq: Two times a day (BID) | ORAL | Status: DC | PRN

## 2019-08-29 MED ORDER — CARVEDILOL 12.5 MG PO TABS
12.5000 mg | ORAL_TABLET | Freq: Two times a day (BID) | ORAL | Status: DC
  Administered 2019-08-30 – 2019-09-18 (×39): 12.5 mg via ORAL
  Filled 2019-08-29 (×38): qty 1

## 2019-08-29 MED ORDER — ALBUTEROL SULFATE (2.5 MG/3ML) 0.083% IN NEBU: 2.5000 mg | INHALATION_SOLUTION | Freq: Four times a day (QID) | RESPIRATORY_TRACT | Status: DC | PRN

## 2019-08-29 MED ORDER — INSULIN ASPART 100 UNIT/ML ~~LOC~~ SOLN: 0.0000 [IU] | Freq: Every day | SUBCUTANEOUS | Status: DC

## 2019-08-29 MED ORDER — MOMETASONE FURO-FORMOTEROL FUM 100-5 MCG/ACT IN AERO
2.0000 | INHALATION_SPRAY | Freq: Two times a day (BID) | RESPIRATORY_TRACT | Status: DC
  Administered 2019-08-30 – 2019-09-18 (×38): 2 via RESPIRATORY_TRACT
  Filled 2019-08-29: qty 8.8

## 2019-08-29 MED ORDER — ENOXAPARIN SODIUM 40 MG/0.4ML ~~LOC~~ SOLN
40.0000 mg | Freq: Two times a day (BID) | SUBCUTANEOUS | Status: DC
  Administered 2019-08-30 – 2019-09-18 (×38): 40 mg via SUBCUTANEOUS
  Filled 2019-08-29 (×40): qty 0.4

## 2019-08-29 MED ORDER — PROCHLORPERAZINE 25 MG RE SUPP: 12.5000 mg | Freq: Four times a day (QID) | RECTAL | Status: DC | PRN

## 2019-08-29 MED ORDER — FLEET ENEMA 7-19 GM/118ML RE ENEM: 1.0000 | ENEMA | Freq: Once | RECTAL | Status: DC | PRN

## 2019-08-29 MED ORDER — PREDNISONE 1 MG PO TABS
3.0000 mg | ORAL_TABLET | Freq: Every day | ORAL | Status: DC
  Administered 2019-08-30 – 2019-09-18 (×20): 3 mg via ORAL
  Filled 2019-08-29 (×20): qty 3

## 2019-08-29 MED ORDER — TRAZODONE HCL 50 MG PO TABS: 25.0000 mg | ORAL_TABLET | Freq: Every evening | ORAL | Status: DC | PRN

## 2019-08-29 MED ORDER — BISACODYL 10 MG RE SUPP
10.0000 mg | Freq: Every day | RECTAL | Status: DC | PRN
  Administered 2019-09-11: 10 mg via RECTAL
  Filled 2019-08-29: qty 1

## 2019-08-29 MED ORDER — HYDROXYCHLOROQUINE SULFATE 200 MG PO TABS
200.0000 mg | ORAL_TABLET | Freq: Two times a day (BID) | ORAL | Status: DC
  Administered 2019-08-29 – 2019-09-18 (×39): 200 mg via ORAL
  Filled 2019-08-29 (×41): qty 1

## 2019-08-29 MED ORDER — ADULT MULTIVITAMIN W/MINERALS CH
1.0000 | ORAL_TABLET | Freq: Every day | ORAL | Status: DC
  Administered 2019-08-30 – 2019-09-18 (×20): 1 via ORAL
  Filled 2019-08-29 (×19): qty 1

## 2019-08-29 MED ORDER — PRAVASTATIN SODIUM 10 MG PO TABS
10.0000 mg | ORAL_TABLET | Freq: Every day | ORAL | Status: DC
  Administered 2019-08-30 – 2019-09-18 (×20): 10 mg via ORAL
  Filled 2019-08-29 (×20): qty 1

## 2019-08-29 MED ORDER — PROCHLORPERAZINE MALEATE 5 MG PO TABS: 5.0000 mg | ORAL_TABLET | Freq: Four times a day (QID) | ORAL | Status: DC | PRN

## 2019-08-29 NOTE — Progress Notes (Signed)
Physical Therapy Treatment Patient Details Name: [name] MRN: [id number] DOB: 08-02-1970 Today's Date: 08/29/2019    History of Present Illness Pt is a 49 y/o female transferred from [facility] for completion of care. Pt has a PMH including but not limited to hepatic mass prior negative by biopsy status post ablation with resolution morbid obesity, lupus, Sjogren's syndrome, Combined systolic/diastolic HF, diabetes mellitus type 2 with gastroparesis, history of CVA, hyperlipidemia, OSA  Admitted for treatment of severe abdominal pain status post embolectomy.    PT Comments    Pt in bed upon PT arrival, agreeable to PT session with focus on progressing mobility and functional strength. The pt continues to present with limitations in functional mobility, strength, and stability compared to her prior level of function and independence due to above dx. The pt was able to demonstrate sig improvements in bed mobility and functional transfers, and was able to perform 3 sit-stand transfers through session with min guard/minA and use of RW. The pt was able to maintain SpO2 >95% on RA, but required seated rest breaks between each sit-stand transfer. The pt will continue to benefit from skilled PT to maximize functional mobility and endurance prior to d/c home.     Follow Up Recommendations  CIR     Equipment Recommendations  None recommended by PT    Recommendations for Other Services       Precautions / Restrictions Precautions Precautions: Fall Restrictions Weight Bearing Restrictions: No    Mobility  Bed Mobility Overal bed mobility: Needs Assistance Bed Mobility: Supine to Sit;Sit to Supine Rolling: Min assist(minA to complete roll, pt is able to move with use of bed rails)   Supine to sit: HOB elevated;Min guard;Min assist Sit to supine: Min guard   General bed mobility comments: minA for BLE movement off EOB, then pt able to come to sitting EOB with use of bed rail and elevated HOB  but no physical assist, some VCs for sequencing and extra time. Pt able to return to supine and reposition with use of bed rails and VCs again for sequencing.  Transfers Overall transfer level: Needs assistance Equipment used: Rolling walker (2 wheeled) Transfers: Sit to/from Stand Sit to Stand: Min guard;+2 safety/equipment;Min assist         General transfer comment: pt able to stand x3 from EOB with use of RW. min guard when pt is allowed to place both hands on RW for stand, minA when pt is asked to place 1 hand on bed and 1 hand on RW to stand.  Ambulation/Gait Ambulation/Gait assistance: +2 safety/equipment;Min guard Gait Distance (Feet): 3 Feet(pt took 1 lateral step x 3 to L) Assistive device: Rolling walker (2 wheeled) Gait Pattern/deviations: Wide base of support;Trunk flexed;Shuffle Gait velocity: decr Gait velocity interpretation: <1.31 ft/sec, indicative of household ambulator General Gait Details: pt has difficulty stepping feet, improved with cueing and needed continued encouragement   Stairs             Wheelchair Mobility    Modified Rankin (Stroke Patients Only)       Balance Overall balance assessment: Needs assistance Sitting-balance support: Feet supported;No upper extremity supported Sitting balance-Leahy Scale: Fair     Standing balance support: During functional activity;Bilateral upper extremity supported Standing balance-Leahy Scale: Poor Standing balance comment: slow but steady                            Cognition Arousal/Alertness: Awake/alert Behavior During Therapy:  Flat affect Overall Cognitive Status: Within Functional Limits for tasks assessed                                 General Comments: VERY flat affect, brightened with discussion of characters on TV show, flat affect may be due to exertion of therapy rather than her personality.      Exercises      General Comments General comments (skin  integrity, edema, etc.): SpO2 95% on RA      Pertinent Vitals/Pain Faces Pain Scale: Hurts a little bit Pain Location: back Pain Descriptors / Indicators: Discomfort;Sore Pain Intervention(s): Repositioned;Limited activity within patient's tolerance    Home Living                      Prior Function            PT Goals (current goals can now be found in the care plan section) Acute Rehab PT Goals Patient Stated Goal: decrease pain, return to independence PT Goal Formulation: With patient Time For Goal Achievement: 09/07/19 Potential to Achieve Goals: Fair Progress towards PT goals: Progressing toward goals    Frequency    Min 3X/week      PT Plan Current plan remains appropriate    Co-evaluation              AM-PAC PT "6 Clicks" Mobility   Outcome Measure  Help needed turning from your back to your side while in a flat bed without using bedrails?: A Little Help needed moving from lying on your back to sitting on the side of a flat bed without using bedrails?: A Little Help needed moving to and from a bed to a chair (including a wheelchair)?: A Little Help needed standing up from a chair using your arms (e.g., wheelchair or bedside chair)?: A Little Help needed to walk in hospital room?: A Lot Help needed climbing 3-5 steps with a railing? : Total 6 Click Score: 15    End of Session Equipment Utilized During Treatment: Gait belt Activity Tolerance: Patient tolerated treatment well Patient left: in bed;with call bell/phone within reach;with bed alarm set;with SCD's reapplied Nurse Communication: Mobility status PT Visit Diagnosis: Other abnormalities of gait and mobility (R26.89);Muscle weakness (generalized) (M62.81)     Time: [id number] PT Time Calculation (min) (ACUTE ONLY): 33 min  Charges:  $Gait Training: 8-22 mins $Therapeutic Activity: 8-22 mins                     Karma Ganja, PT, DPT   Acute Rehabilitation Department Pager #: [phone]   Otho Bellows 08/29/2019, 12:37 PM

## 2019-08-29 NOTE — H&P (Signed)
Physical Medicine and Rehabilitation Admission H&P    CC: Debility   HPI: [name] is a 49 year old female with history of T2DM with gastroparesis, HTN, super morbid obesity, Sjogren's syndrome, Lupus, systolic CHF, OSA,hepatic  adenoma s/p embolization; who was originally admitted on 08/12/19 with 2 week history of N/V with severe abdominal pain. She was found to have AKI, metabolic acidosis with ketosis, elevated LFTs, AKI and anemia.  CT abdomen/pelvis/chest showed multiple mass-like lesions in the liver with hemoperitoneum. She was started on insulin drip for DKA, dilaudid PCA for pain control and sodium bicarb for AKI/metabolic acidosis.. Foley placed with increase in UOP and Dr. Carolin Sicks recommended monitoring for fluid overload with history of CHF.  Dr. Hilarie Fredrickson evaluated patient and recommended transfer to [facility] (Dr. Loletha Grayer) for evaluation and intervention.    She was transferred to [facility]-- MRI showed multiple nonenhancing large hepatic lesions with hemorrhage in the mass and she underwent embolization of right lobe adenomas on 01/20, placed on Lovenox for DVT prophylaxis and transferred back to [facility] on 01/22 for management.  Lovenox d/c due to vaginal bleeding but H/H stable. She continues to have issues with weakness, difficulty standing and is noted to be debilitated. CIR recommended due to functional decline.  Pt reports abdominal pain is "there, no matter what meds she takes", not clear if it helps or not, considering she keeps taking it.   She reports she's doing "all right".  Just worked with PT and so is sore all over.  Most pain is abdominal and describes as aching.  LBM was yesterday- denies constipation Sx's Urinating OK- has purewick, but said the urine has "blood in  It" Nurse reports pt needs to drink more.     Review of Systems  Constitutional: Negative for fever and weight loss.  HENT: Negative.   Eyes: Negative for blurred vision and double vision.  Respiratory:  Positive for shortness of breath (DOE).   Cardiovascular: Negative for chest pain and palpitations.  Gastrointestinal: Positive for abdominal pain (getting better--worse with activity). Negative for constipation and diarrhea.  Genitourinary: Positive for hematuria. Negative for dysuria.       Low estrogen levels.  Musculoskeletal: Positive for joint pain and myalgias.  Skin: Negative.   Neurological: Positive for weakness. Negative for sensory change, seizures and loss of consciousness.  Endo/Heme/Allergies: Negative.   Psychiatric/Behavioral: Positive for depression. The patient has insomnia.   All other systems reviewed and are negative.    Past Medical History:  Diagnosis Date  . Acute renal failure (HCC)     Intractable nausea vomiting secondary to diabetic gastroparesis causing dehydration and acute renal failure /notes 04/01/2013  . Anginal pain ([place])   . Anxiety   . Bell's palsy 08/09/2010  . CAP (community acquired pneumonia) 10/2011   Archie Endo 11/08/2011  . Chest pain 01/13/2014  . CHF (congestive heart failure) ([place])   . Diabetic gastroparesis associated with type 2 diabetes mellitus ([place])    this is presumed diagnoses, not confirmed by any studies.   . Eczema   . Family history of malignant neoplasm of breast   . Family history of malignant neoplasm of ovary   . GERD (gastroesophageal reflux disease)   . High cholesterol   . Hypertension   . Liver lesion 08/13/2019  . Liver mass 2014   biopsied 03/2013 at [facility], not malignant.  is to undergo radiologic ablation of the mass in sept/October 2014.   . Lupus ([place])   . Migraines    "  maybe a couple times/yr" (04/01/2013)  . Obesity   . Obstructive sleep apnea on CPAP 2011  . On home oxygen therapy    "2L 24/7" (02/28/2015)  . SJOGREN'S SYNDROME 08/09/2010  . SLE (systemic lupus erythematosus) ([place])    Archie Endo 12/01/2010  . Stroke [facility]) 2010; 10/2012   "left side is still weak from it, never fully regained full strength; no  additions from stroke 10/2012"    Past Surgical History:  Procedure Laterality Date  . BREAST CYST EXCISION Left 08/2005   epidermoid  . CARDIAC CATHETERIZATION  02/07/12  . ECTOPIC PREGNANCY SURGERY  1999  . ECTOPIC PREGNANCY SURGERY  1999  . ESOPHAGOGASTRODUODENOSCOPY N/A 02/26/2013   Procedure: ESOPHAGOGASTRODUODENOSCOPY (EGD);  Surgeon: Irene Shipper, MD;  Location[facility] END[facility]COPY;  Service: Endoscopy;  Laterality: N/A;  . ESOPHAGOGASTRODUODENOSCOPY N/A 03/02/2015   Procedure: ESOPHAGOGASTRODUODENOSCOPY (EGD);  Surgeon: Milus Banister, MD;  Location: [facility];  Service: Endoscopy;  Laterality: N/A;  . HERNIA REPAIR    . LEFT AND RIGHT HEART CATHETERIZATION WITH CORONARY ANGIOGRAM N/A 02/07/2012   Procedure: LEFT AND RIGHT HEART CATHETERIZATION WITH CORONARY ANGIOGRAM;  Surgeon: Laverda Page, MD;  Location: [facility] CATH LAB;  Service: Cardiovascular;  Laterality: N/A;  . LIVER BIOPSY  03/2013   liver mass/medical hx noted above  . MUSCLE BIOPSY     for lupus/notes 12/01/2010  . UMBILICAL HERNIA REPAIR  1980's    Family History  Problem Relation Age of Onset  . Diabetes Mother   . Asthma Mother   . Urticaria Mother   . Hypertension Mother   . Breast cancer Maternal Aunt 57       currently 90  . Stomach cancer Maternal Uncle        dx 68s; deceased  . Prostate cancer Maternal Uncle        deceased 54s  . Cancer Maternal Aunt        unk. primary  . Cancer Maternal Aunt        unk. primary  . Ovarian cancer Maternal Aunt        deceased 27s  . Hypertension Other   . Stroke Other   . Arthritis Other   . Ovarian cancer Sister 26       maternal half-sister; RAD51C positive  . Cancer Paternal Aunt        unk. primary; deceased 78s  . Prostate cancer Paternal Uncle        deceased 80s  . Breast cancer Cousin        deceased 54s; daughter of mat uncle with stomach ca  . Breast cancer Maternal Aunt        deceased 66s  . Asthma Brother   . Asthma Brother   . Allergic rhinitis  Neg Hx   . Angioedema Neg Hx     Social History:  Single. 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 She reports that she quit smoking about 28 years ago. Her smoking use included cigarettes. She has a 1.50 pack-year smoking history. She has never used smokeless tobacco. She reports that she does not drink alcohol or use drugs. Pt lives in [place] in a 1 story house down the street from sister with 8 STE; her roommate, she's known since age 24 and he's helpful; just lost her brother, so depressed/sad.   Allergies  Allergen Reactions  . Metoclopramide Other (See Comments)    Developed restless leg, akathisia type limb movements.   . Codeine Itching, Rash and  Other (See Comments)  . Penicillins Itching    Has patient had a PCN reaction causing immediate rash, facial/tongue/throat swelling, SOB or lightheadedness with hypotension: No Has patient had a PCN reaction causing severe rash involving mucus membranes or skin necrosis: No Has patient had a PCN reaction that required hospitalization: No Has patient had a PCN reaction occurring within the last 10 years:yes If all of the above answers are "NO", then may proceed with Cephalosporin use.  Marland Kitchen Hydrocodone Rash  . Percocet [Oxycodone-Acetaminophen] Hives and Rash    Tolerates dilaudid    Medications Prior to Admission  Medication Sig Dispense Refill  . Accu-Chek Softclix Lancets lancets Use to monitor glucose levels 4 times per day; E11.9 200 each 2  . albuterol (PROAIR HFA) 108 (90 BASE) MCG/ACT inhaler Inhale 2 puffs into the lungs every 6 (six) hours as needed for wheezing or shortness of breath.     Marland Kitchen albuterol (PROVENTIL) (2.5 MG/3ML) 0.083% nebulizer solution Take 2.5 mg by nebulization every 6 (six) hours as needed for wheezing.    Marland Kitchen ALPRAZolam (XANAX) 0.5 MG tablet Take 0.5 mg 2 (two) times daily as needed by mouth for anxiety or sleep.     Marland Kitchen amLODipine (NORVASC) 5 MG tablet Take 1 tablet (5 mg total) by mouth  daily. 90 tablet 3  . Azathioprine 75 MG TABS Take 150 mg by mouth 2 (two) times daily.     . B-D UF III MINI PEN NEEDLES 31G X 5 MM MISC USE AS DIRECTED TWICE A DAY 100 each 2  . benzonatate (TESSALON) 100 MG capsule Take by mouth continuous as needed for cough.    Marland Kitchen BIDIL 20-37.5 MG tablet TAKE TWO (2) TABLETS BY MOUTH THREE TIMES DAILY  (Patient taking differently: Take 2 tablets by mouth 3 (three) times daily. ) 180 tablet 3  . Blood Glucose Monitoring Suppl (ACCU-CHEK AVIVA) device Use as instructed 1 each 0  . buPROPion (WELLBUTRIN XL) 300 MG 24 hr tablet Take 300 mg by mouth daily.    Marland Kitchen BYETTA 10 MCG PEN 10 MCG/0.04ML SOPN injection INJECT 0.04ML UNDER THE SKIN 2 TIMES DAILY WITH A MEAL (Patient taking differently: Inject 10 mcg into the skin 2 (two) times daily with a meal. ) 1 pen 0  . carvedilol (COREG) 25 MG tablet Take 37.5 mg by mouth 2 (two) times daily with a meal.     . cetirizine (ZYRTEC) 10 MG tablet Take 10 mg by mouth daily as needed for allergies.     . clotrimazole-betamethasone (LOTRISONE) cream Apply 1 application topically 2 (two) times daily. (Patient taking differently: Apply 1 application topically as needed (skin irritation). ) 45 g 3  . cyanocobalamin (,VITAMIN B-12,) 1000 MCG/ML injection Inject into the muscle. Every other month    . cycloSPORINE (RESTASIS) 0.05 % ophthalmic emulsion Place 1 drop into both eyes 2 (two) times daily.     . diclofenac sodium (VOLTAREN) 1 % GEL Apply 2-4 g 4 (four) times daily as needed topically (joint pain).    Marland Kitchen doxepin (SINEQUAN) 50 MG capsule Take 50-100 mg by mouth at bedtime as needed (sleep).     . DULoxetine (CYMBALTA) 60 MG capsule Take 120 mg by mouth at bedtime.     Marland Kitchen EASY TOUCH PEN NEEDLES 32G X 4 MM MISC USE TO INJECT INSULIN TWICE DAILY 100 each 9  . etonogestrel-ethinyl estradiol (NUVARING) 0.12-0.015 MG/24HR vaginal ring Place 1 each every 28 (twenty-eight) days vaginally. Insert vaginally and leave in place for  3  consecutive weeks, then remove for 1 week.    . famotidine (PEPCID) 20 MG tablet Take 20 mg by mouth 2 (two) times daily.    . fluticasone (FLONASE) 50 MCG/ACT nasal spray Place 2 sprays into both nostrils continuous as needed for allergies or rhinitis.    Marland Kitchen glucose blood (ACCU-CHEK AVIVA PLUS) test strip Use to check blood sugar 4 times daily. **Needs appt for refills** 150 each 0  . hydroxychloroquine (PLAQUENIL) 200 MG tablet Take 200 mg by mouth 2 (two) times daily.     . insulin NPH Human (HUMULIN N) 100 UNIT/ML injection Inject 2 mLs (200 Units total) into the skin every morning. And syringes 3/day (Patient taking differently: Inject 220 Units into the skin every morning. And syringes 3/day) 70 mL 11  . Insulin Syringe-Needle U-100 (EASY TOUCH INSULIN SYRINGE) 31G X 5/16" 1 ML MISC USE AS DIRECTED THREE TIMES A DAY 100 each 10  . megestrol (MEGACE) 40 MG tablet Take 40 mg by mouth daily.  3  . mometasone-formoterol (DULERA) 100-5 MCG/ACT AERO Inhale 2 puffs into the lungs 2 (two) times daily.    Marland Kitchen oxybutynin (DITROPAN-XL) 10 MG 24 hr tablet Take 15 mg by mouth daily.     . Polyvinyl Alcohol-Povidone (REFRESH OP) Place 1 drop 2 (two) times daily into both eyes.     . pravastatin (PRAVACHOL) 10 MG tablet Take 10 mg by mouth daily.    . predniSONE (DELTASONE) 1 MG tablet Take 3 mg by mouth daily.     . pregabalin (LYRICA) 75 MG capsule Take 75 mg 3 (three) times daily by mouth.     . psyllium (METAMUCIL MULTIHEALTH FIBER) 58.6 % powder Take 1 packet by mouth 2 (two) times daily.    . sucralfate (CARAFATE) 1 GM/10ML suspension Take 1 g by mouth 2 (two) times daily.    Marland Kitchen topiramate (TOPAMAX) 25 MG tablet Take 75 mg by mouth at bedtime.    . traMADol (ULTRAM) 50 MG tablet TAKE 1 TO 2 TABLETS BY MOUTH EVERY 6 HOURS AS NEEDED FOR PAIN**PA REQ** (Patient taking differently: Take 50-100 mg by mouth every 6 (six) hours as needed (pain). ) 40 tablet 0  . Vitamin D, Ergocalciferol, (DRISDOL) 1.25 MG  (50000 UNIT) CAPS capsule Take 50,000 Units by mouth every 7 (seven) days.      Drug Regimen Review  Drug regimen was reviewed and remains appropriate with no significant issues identified  Home: Home Living Family/patient expects to be discharged to:: Private residence Living Arrangements: Non-relatives/Friends Available Help at Discharge: Family, Available PRN/intermittently Type of Home: House Home Access: Stairs to enter Technical brewer of Steps: 8 Entrance Stairs-Rails: Left Home Layout: One level Bathroom Shower/Tub: Chiropodist: Handicapped height Home Equipment: Civil engineer, contracting, Hand held shower head, Bedside commode, Cane - single point, Environmental consultant - 4 wheels   Functional History: Prior Function Level of Independence: Independent with assistive device(s) Comments: ambulates with a cane intermittently; independent with BADL  Functional Status:  Mobility: Bed Mobility Overal bed mobility: Needs Assistance Bed Mobility: Supine to Sit, Sit to Supine Rolling: Mod assist, +2 for physical assistance Supine to sit: HOB elevated, Mod assist Sit to supine: Mod assist, HOB elevated, +2 for physical assistance General bed mobility comments: min A at trunk for supine to sit, once pt slid LE's off bed. Min A to position squarely at EOB. Mod A for LE's for return to supine. Pt has difficulty moving in current bed due to body habitus,  would benefit from wider bed.  Transfers Overall transfer level: Needs assistance Equipment used: Rolling walker (2 wheeled) Transfer via Lift Equipment: (back of recliner) Transfers: Sit to/from Stand Sit to Stand: +2 physical assistance, From elevated surface, Min assist General transfer comment: declined standing due to fatigue Ambulation/Gait Ambulation/Gait assistance: +2 safety/equipment, Min assist Gait Distance (Feet): 4 Feet Assistive device: Rolling walker (2 wheeled) Gait Pattern/deviations: Wide base of support, Trunk  flexed, Shuffle General Gait Details: pt has difficulty stepping feet, improved with cueing and needed continued encouragement Gait velocity: decr Gait velocity interpretation: <1.31 ft/sec, indicative of household ambulator    ADL: ADL Overall ADL's : Needs assistance/impaired Eating/Feeding: Set up, Sitting Grooming: Wash/dry hands, Wash/dry face, Oral care, Sitting, Minimal assistance Upper Body Bathing: Sitting, Moderate assistance Lower Body Bathing: Maximal assistance, Sit to/from stand Upper Body Dressing : Moderate assistance, Sitting Lower Body Dressing: Maximal assistance, Sit to/from stand, Sitting/lateral leans Toilet Transfer: Maximal assistance, +2 for physical assistance, +2 for safety/equipment, Stand-pivot Toilet Transfer Details (indicate cue type and reason): max A for sit <> stand only this date Toileting- Clothing Manipulation and Hygiene: Maximal assistance, Sit to/from stand Functional mobility during ADLs: Maximal assistance(max A to pull up on back of recliner for sit <> stand only) General ADL Comments: pt with VERY flat affect.  Will need post acute rehab  Cognition: Cognition Overall Cognitive Status: Within Functional Limits for tasks assessed Orientation Level: Oriented X4 Cognition Arousal/Alertness: Awake/alert Behavior During Therapy: Flat affect Overall Cognitive Status: Within Functional Limits for tasks assessed General Comments: VERY flat affect   Blood pressure 140/69, pulse 79, temperature 98.5 F (36.9 C), resp. rate 18, height '5\' 8"'$  (1.727 m), weight (!) 193.4 kg, last menstrual period 03/24/2017, SpO2 95 %. Physical Exam  Nursing note and vitals reviewed. Constitutional: She is oriented to person, place, and time.  Morbidly obese female with BMI of 64+ sititng up in bed; calling nurse for pain meds (didn't specifically ask, but insisted nurse come "now".) Has purewick; dark cola colored urine, NAD  HENT:  Head: Normocephalic and  atraumatic.  Nose: Nose normal.  Mouth/Throat: Oropharynx is clear and moist. No oropharyngeal exudate.  Eyes: Pupils are equal, round, and reactive to light. Conjunctivae and EOM are normal. Right eye exhibits no discharge. Left eye exhibits no discharge.  Neck: No tracheal deviation present.  Cardiovascular: Normal rate and regular rhythm.  No murmur heard. Respiratory: Effort normal. No stridor. No respiratory distress. She has wheezes (occasional upper airway sounds). She has no rales.  GI: Soft. Bowel sounds are normal. There is no rebound and no guarding.  Soft, protuberant, mildly TTP diffusely; ND, (+) hypoactive BS  Genitourinary:    Genitourinary Comments: Had purewick- dark cola colored urine   Musculoskeletal:        General: No tenderness or edema.     Cervical back: Normal range of motion and neck supple.     Comments: B/L UEs- deltoi 4-/5, biceps 4/5, triceps 4/5, WE 4/5, grip 4/5, finger abd 4-/5  LEs- HF 2+/5, KE 2+/5, DF/PF 4-/5 Very large LEs- hard to move for examiner or pt.   Neurological: She is alert and oriented to person, place, and time. No cranial nerve deficit. Coordination normal.  Sensation intact in all 4 extremities to light touch   Skin: Skin is warm and dry.  Did not see any wounds- not able to turn to look at backside Right breast intertriginous area with large area of denuded tissue with white eschar. Area under  left breast moist and macerated appearing--tender to touch.   1 IV in L forearm- no signs of infiltration  Psychiatric:  Slow to process/respond Flat affect    Results for orders placed or performed during the hospital encounter of 08/23/19 (from the past 48 hour(s))  Glucose, capillary     Status: Abnormal   Collection Time: 08/27/19 11:33 AM  Result Value Ref Range   Glucose-Capillary 275 (H) 70 - 99 mg/dL  Glucose, capillary     Status: Abnormal   Collection Time: 08/27/19  3:51 PM  Result Value Ref Range   Glucose-Capillary 138  (H) 70 - 99 mg/dL  Glucose, capillary     Status: Abnormal   Collection Time: 08/27/19  7:57 PM  Result Value Ref Range   Glucose-Capillary 182 (H) 70 - 99 mg/dL   Comment 1 Notify RN    Comment 2 Document in Chart   Glucose, capillary     Status: Abnormal   Collection Time: 08/28/19 12:06 AM  Result Value Ref Range   Glucose-Capillary 168 (H) 70 - 99 mg/dL   Comment 1 Notify RN    Comment 2 Document in Chart   Glucose, capillary     Status: Abnormal   Collection Time: 08/28/19  4:12 AM  Result Value Ref Range   Glucose-Capillary 104 (H) 70 - 99 mg/dL   Comment 1 Notify RN    Comment 2 Document in Chart   Glucose, capillary     Status: Abnormal   Collection Time: 08/28/19  7:56 AM  Result Value Ref Range   Glucose-Capillary 146 (H) 70 - 99 mg/dL  Glucose, capillary     Status: Abnormal   Collection Time: 08/28/19 10:16 AM  Result Value Ref Range   Glucose-Capillary 154 (H) 70 - 99 mg/dL  Glucose, capillary     Status: Abnormal   Collection Time: 08/28/19 12:18 PM  Result Value Ref Range   Glucose-Capillary 133 (H) 70 - 99 mg/dL  Glucose, capillary     Status: Abnormal   Collection Time: 08/28/19  4:20 PM  Result Value Ref Range   Glucose-Capillary 225 (H) 70 - 99 mg/dL  Glucose, capillary     Status: Abnormal   Collection Time: 08/28/19  8:08 PM  Result Value Ref Range   Glucose-Capillary 167 (H) 70 - 99 mg/dL   Comment 1 Notify RN    Comment 2 Document in Chart   Glucose, capillary     Status: Abnormal   Collection Time: 08/29/19 12:12 AM  Result Value Ref Range   Glucose-Capillary 154 (H) 70 - 99 mg/dL   Comment 1 Notify RN    Comment 2 Document in Chart   Glucose, capillary     Status: Abnormal   Collection Time: 08/29/19  4:07 AM  Result Value Ref Range   Glucose-Capillary 132 (H) 70 - 99 mg/dL   Comment 1 Notify RN    Comment 2 Document in Chart   Glucose, capillary     Status: Abnormal   Collection Time: 08/29/19  5:56 AM  Result Value Ref Range    Glucose-Capillary 107 (H) 70 - 99 mg/dL  Glucose, capillary     Status: None   Collection Time: 08/29/19  7:58 AM  Result Value Ref Range   Glucose-Capillary 94 70 - 99 mg/dL   No results found.     Medical Problem List and Plan: 1.  Impaired mobility, ADLs and function secondary to debility due to hemrrhagic shock due to bleeding adenoma, AKI,  and DKA  -patient may  shower  -ELOS/Goals: 7-10 days/ supervision to min assist 2.  Antithrombotics: -DVT/anticoagulation:  Mechanical: Sequential compression devices, below knee Bilateral lower extremities  -would benefit from Lovenox- will see if possible since her size and immobility  -antiplatelet therapy: N/A 3. Pain Management: Need to educated/encourage patient to work thorough pain as > 7 days post embolization. Continue MS contin with MSIR   4. Mood: LCSW to follow for evaluation and support.   -antipsychotic agents: N/A 5. Neuropsych: This patient is capable of making decisions on her own behalf. 6. Skin/Wound Care:  Antimicrobial fabric to absorb moisture to assist in management of MASD.  7. Fluids/Electrolytes/Nutrition: Continue Ensure max. Will add prostat for depleted protein stores.  8. Liver adenomas with bleeding s/p embolization: Continue to monitor H/H. 9. OSA/OHS: Continue 2L oxygen per [place]--encourage CPAP at nights. Continue Dulera bid 10. T2DM: Hgb A1c- 8.6--> improving/sees Dr. Loanne Drilling. Continue to monitor BS ac/hs and titrate lantus as indicated--intake poor at this time. Continue Lantus with SSI  11. Acute on chronic renal failure?: SCR improved from 3.10-->1.29 12. Chronic combined systolic CHF: Check weights daily. Monitor for signs of overload. On Entresto, pravastatin, Bidil and coreg.  13. H/o Lupus: On Plaquenil bid with prednisone. On Lyrica for pain management.   14. H/o anxiety/depression: On Wellbutrin and cymbalta with Xanax prn. - getting over recent death of brother.  15. Hematuria?/Vaginal bleeding?:  Nuvaring out PTA. Reports heavy bleeding off megestrol--?resume.   Bary Leriche, PA-C 08/29/2019   I have personally performed a face to face diagnostic evaluation of this patient and formulated the key components of the plan.  Additionally, I have personally reviewed laboratory data, imaging studies, as well as relevant notes and concur with the physician assistant's documentation above.   The patient's status has not changed from the original H&P.  Any changes in documentation from the acute care chart have been noted above.

## 2019-08-29 NOTE — TOC Transition Note (Signed)
Transition of Care [facility]) - CM/SW Discharge Note   Patient Details  Name: [name] MRN: [id number] Date of Birth: [birth year]  [facility] CM/SW Contact:  Zenon Mayo, RN Phone Number: 08/29/2019, 3:08 PM   Clinical Narrative:    Patient for DC to CIR today.   Final next level of care: IP Rehab Facility Barriers to Discharge: No Barriers Identified   Patient Goals and CMS Choice   CMS Medicare.gov Compare Post Acute Care list provided to:: Patient Choice offered to / list presented to : Patient  Discharge Placement                       Discharge Plan and Services     Post Acute Care Choice: Home Health                               Social Determinants of Health (SDOH) Interventions     Readmission Risk Interventions No flowsheet data found.

## 2019-08-29 NOTE — Progress Notes (Addendum)
Inpatient Rehabilitation-Admissions Coordinator   Pt has decided to pursue CIR at this time. Family has confirmed support at DC. Awaiting medical clearance from Dr. Alfredia Ferguson for possible admit to IP Rehab today. Will update once we have clearance.   Please call if questions.   Raechel Ache, OTR/L  Rehab Admissions Coordinator  [phone] 08/29/2019 11:15 AM  Addendum 12:05: received medical clearance from Dr. Alfredia Ferguson for admit to CIR today. RN and [facility] team aware of plan for today. Pt signed consent form and we reviewed insurance letter. Pt had no further questions.   Raechel Ache, OTR/L  Rehab Admissions Coordinator  [phone] 08/29/2019 12:06 PM

## 2019-08-29 NOTE — H&P (Signed)
untitled image        Physical Medicine and Rehabilitation Admission H&P        CC: Debility        HPI: [name] is a 49 year old female with history of T2DM with gastroparesis, HTN, super morbid obesity, Sjogren's syndrome, Lupus, systolic CHF, OSA,hepatic  adenoma s/p embolization; who was originally admitted on 08/12/19 with 2 week history of N/V with severe abdominal pain. She was found to have AKI, metabolic acidosis with ketosis, elevated LFTs, AKI and anemia.  CT abdomen/pelvis/chest showed multiple mass-like lesions in the liver with hemoperitoneum. She was started on insulin drip for DKA, dilaudid PCA for pain control and sodium bicarb for AKI/metabolic acidosis.. Foley placed with increase in UOP and Dr. Carolin Sicks recommended monitoring for fluid overload with history of CHF.  Dr. Hilarie Fredrickson evaluated patient and recommended transfer to [facility] (Dr. Loletha Grayer) for evaluation and intervention.       She was transferred to [facility]-- MRI showed multiple nonenhancing large hepatic lesions with hemorrhage in the mass and she underwent embolization of right lobe adenomas on 01/20, placed on Lovenox for DVT prophylaxis and transferred back to [facility] on 01/22 for management.  Lovenox d/c due to vaginal bleeding but H/H stable. She continues to have issues with weakness, difficulty standing and is noted to be debilitated. CIR recommended due to functional decline.     Pt reports abdominal pain is "there, no matter what meds she takes", not clear if it helps or not, considering she keeps taking it.      She reports she's doing "all right".   Just worked with PT and so is sore all over.   Most pain is abdominal and describes as aching.   LBM was yesterday- denies constipation Sx's  Urinating OK- has purewick, but said the urine has "blood in  It" Nurse reports pt needs to drink more.         Review of Systems   Constitutional: Negative for fever and weight loss.   HENT:  Negative.    Eyes: Negative for blurred vision and double vision.   Respiratory: Positive for shortness of breath (DOE).    Cardiovascular: Negative for chest pain and palpitations.   Gastrointestinal: Positive for abdominal pain (getting better--worse with activity). Negative for constipation and diarrhea.   Genitourinary: Positive for hematuria. Negative for dysuria.        Low estrogen levels.   Musculoskeletal: Positive for joint pain and myalgias.   Skin: Negative.    Neurological: Positive for weakness. Negative for sensory change, seizures and loss of consciousness.   Endo/Heme/Allergies: Negative.    Psychiatric/Behavioral: Positive for depression. The patient has insomnia.    All other systems reviewed and are negative.             Past Medical History:    Diagnosis   Date    .   Acute renal failure (HCC)             Intractable nausea vomiting secondary to diabetic gastroparesis causing dehydration and acute renal failure /notes 04/01/2013    .   Anginal pain ([place])        .   Anxiety        .   Bell's palsy   08/09/2010    .   CAP (community acquired pneumonia)   10/2011        [facility] 11/08/2011    .   Chest pain   01/13/2014    .  CHF (congestive heart failure) ([place])        .   Diabetic gastroparesis associated with type 2 diabetes mellitus ([place])            this is presumed diagnoses, not confirmed by any studies.     .   Eczema        .   Family history of malignant neoplasm of breast        .   Family history of malignant neoplasm of ovary        .   GERD (gastroesophageal reflux disease)        .   High cholesterol        .   Hypertension        .   Liver lesion   08/13/2019    .   Liver mass   2014        biopsied 03/2013 at [facility], not malignant.  is to undergo radiologic ablation of the mass in sept/October 2014.     .   Lupus  ([place])        .   Migraines            "maybe a couple times/yr" (04/01/2013)    .   Obesity        .   Obstructive sleep apnea on CPAP   2011    .   On home oxygen therapy            "2L 24/7" (02/28/2015)    .   SJOGREN'S SYNDROME   08/09/2010    .   SLE (systemic lupus erythematosus) ([place])            [facility] 12/01/2010    .   Stroke [facility])   2010; 10/2012        "left side is still weak from it, never fully regained full strength; no additions from stroke 10/2012"                Past Surgical History:    Procedure   Laterality   Date    .   BREAST CYST EXCISION   Left   08/2005        epidermoid    .   CARDIAC CATHETERIZATION       02/07/12    .   ECTOPIC PREGNANCY SURGERY       1999    .   ECTOPIC PREGNANCY SURGERY       1999    .   ESOPHAGOGASTRODUODENOSCOPY   N/A   02/26/2013        Procedure: ESOPHAGOGASTRODUODENOSCOPY (EGD);  Surgeon: Irene Shipper, MD;  Location: [facility] ENDOSCOPY;  Service: Endoscopy;  Laterality: N/A;    .   ESOPHAGOGASTRODUODENOSCOPY   N/A   03/02/2015        Procedure: ESOPHAGOGASTRODUODENOSCOPY (EGD);  Surgeon: Milus Banister, MD;  Location: [facility];  Service: Endoscopy;  Laterality: N/A;    .   HERNIA REPAIR            .   LEFT AND RIGHT HEART CATHETERIZATION WITH CORONARY ANGIOGRAM   N/A   02/07/2012        Procedure: LEFT AND RIGHT HEART CATHETERIZATION WITH CORONARY ANGIOGRAM;  Surgeon: Laverda Page, MD;  Location: [facility] CATH LAB;  Service: Cardiovascular;  Laterality: N/A;    .   LIVER BIOPSY       03/2013        liver  mass/medical hx noted above    .   MUSCLE BIOPSY                for lupus/notes 12/01/2010    .   UMBILICAL HERNIA REPAIR       1980's                Family History    Problem   Relation   Age of Onset    .   Diabetes   Mother        .   Asthma    Mother        .   Urticaria   Mother        .   Hypertension   Mother        .   Breast cancer   Maternal Aunt   60            currently 48    .   Stomach cancer   Maternal Uncle                dx 82s; deceased    .   Prostate cancer   Maternal Uncle                deceased 77s    .   Cancer   Maternal Aunt                unk. primary    .   Cancer   Maternal Aunt                unk. primary    .   Ovarian cancer   Maternal Aunt                deceased 28s    .   Hypertension   Other        .   Stroke   Other        .   Arthritis   Other        .   Ovarian cancer   Sister   60            maternal half-sister; RAD51C positive    .   Cancer   Paternal Aunt                unk. primary; deceased 58s    .   Prostate cancer   Paternal Uncle                deceased 43s    .   Breast cancer   Cousin                deceased 2s; daughter of mat uncle with stomach ca    .   Breast cancer   Maternal Aunt                deceased 78s    .   Asthma   Brother        .   Asthma   Brother        .   Allergic rhinitis   Neg Hx        .   Angioedema   Neg Hx              Social History:  Single. 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 She reports that she quit smoking about 28 years ago. Her smoking use included cigarettes. She has a 1.50 pack-year smoking  history. She has never used smokeless tobacco. She reports that she does not drink alcohol or use drugs. Pt lives in [place] in a 1 story house down the street from sister with 8 STE; her roommate, she's known since age 89 and he's helpful; just lost her brother, so depressed/sad.              Allergies    Allergen   Reactions    .   Metoclopramide   Other (See Comments)             Developed restless leg, akathisia type limb movements.     .   Codeine   Itching, Rash and Other (See Comments)    .   Penicillins   Itching            Has patient had a PCN reaction causing immediate rash, facial/tongue/throat swelling, SOB or lightheadedness with hypotension: No  Has patient had a PCN reaction causing severe rash involving mucus membranes or skin necrosis: No  Has patient had a PCN reaction that required hospitalization: No  Has patient had a PCN reaction occurring within the last 10 years:yes  If all of the above answers are "NO", then may proceed with Cephalosporin use.    Marland Kitchen   Hydrocodone   Rash    .   Percocet [Oxycodone-Acetaminophen]   Hives and Rash            Tolerates dilaudid                 Medications Prior to Admission    Medication   Sig   Dispense   Refill    .   Accu-Chek Softclix Lancets lancets   Use to monitor glucose levels 4 times per day; E11.9   200 each   2    .   albuterol (PROAIR HFA) 108 (90 BASE) MCG/ACT inhaler   Inhale 2 puffs into the lungs every 6 (six) hours as needed for wheezing or shortness of breath.             Marland Kitchen   albuterol (PROVENTIL) (2.5 MG/3ML) 0.083% nebulizer solution   Take 2.5 mg by nebulization every 6 (six) hours as needed for wheezing.            Marland Kitchen   ALPRAZolam (XANAX) 0.5 MG tablet   Take 0.5 mg 2 (two) times daily as needed by mouth for anxiety or sleep.             Marland Kitchen   amLODipine (NORVASC) 5 MG tablet   Take 1 tablet (5 mg total) by mouth daily.   90 tablet   3    .   Azathioprine 75 MG TABS   Take 150 mg by mouth 2 (two) times daily.             .   B-D UF III MINI PEN NEEDLES 31G X 5 MM MISC   USE AS DIRECTED TWICE A DAY   100 each   2    .   benzonatate (TESSALON) 100 MG capsule   Take by mouth continuous as needed for cough.            Marland Kitchen   BIDIL 20-37.5 MG tablet   TAKE TWO (2)  TABLETS BY MOUTH THREE TIMES DAILY  (Patient taking differently: Take 2 tablets by mouth 3 (three) times daily. )   180 tablet   3    .   Blood Glucose Monitoring Suppl (  ACCU-CHEK AVIVA) device   Use as instructed   1 each   0    .   buPROPion (WELLBUTRIN XL) 300 MG 24 hr tablet   Take 300 mg by mouth daily.            Marland Kitchen   BYETTA 10 MCG PEN 10 MCG/0.04ML SOPN injection   INJECT 0.04ML UNDER THE SKIN 2 TIMES DAILY WITH A MEAL (Patient taking differently: Inject 10 mcg into the skin 2 (two) times daily with a meal. )   1 pen   0    .   carvedilol (COREG) 25 MG tablet   Take 37.5 mg by mouth 2 (two) times daily with a meal.             .   cetirizine (ZYRTEC) 10 MG tablet   Take 10 mg by mouth daily as needed for allergies.             .   clotrimazole-betamethasone (LOTRISONE) cream   Apply 1 application topically 2 (two) times daily. (Patient taking differently: Apply 1 application topically as needed (skin irritation). )   45 g   3    .   cyanocobalamin (,VITAMIN B-12,) 1000 MCG/ML injection   Inject into the muscle. Every other month            .   cycloSPORINE (RESTASIS) 0.05 % ophthalmic emulsion   Place 1 drop into both eyes 2 (two) times daily.             .   diclofenac sodium (VOLTAREN) 1 % GEL   Apply 2-4 g 4 (four) times daily as needed topically (joint pain).            Marland Kitchen   doxepin (SINEQUAN) 50 MG capsule   Take 50-100 mg by mouth at bedtime as needed (sleep).             .   DULoxetine (CYMBALTA) 60 MG capsule   Take 120 mg by mouth at bedtime.             Marland Kitchen   EASY TOUCH PEN NEEDLES 32G X 4 MM MISC   USE TO INJECT INSULIN TWICE DAILY   100 each   9    .   etonogestrel-ethinyl estradiol (NUVARING) 0.12-0.015 MG/24HR vaginal ring   Place 1 each every 28 (twenty-eight) days vaginally. Insert vaginally and leave in place for 3 consecutive weeks, then remove for 1  week.            .   famotidine (PEPCID) 20 MG tablet   Take 20 mg by mouth 2 (two) times daily.            .   fluticasone (FLONASE) 50 MCG/ACT nasal spray   Place 2 sprays into both nostrils continuous as needed for allergies or rhinitis.            Marland Kitchen   glucose blood (ACCU-CHEK AVIVA PLUS) test strip   Use to check blood sugar 4 times daily. **Needs appt for refills**   150 each   0    .   hydroxychloroquine (PLAQUENIL) 200 MG tablet   Take 200 mg by mouth 2 (two) times daily.             .   insulin NPH Human (HUMULIN N) 100 UNIT/ML injection   Inject 2 mLs (200 Units total) into the skin every morning. And syringes 3/day (Patient taking differently: Inject 220 Units  into the skin every morning. And syringes 3/day)   70 mL   11    .   Insulin Syringe-Needle U-100 (EASY TOUCH INSULIN SYRINGE) 31G X 5/16" 1 ML MISC   USE AS DIRECTED THREE TIMES A DAY   100 each   10    .   megestrol (MEGACE) 40 MG tablet   Take 40 mg by mouth daily.       3    .   mometasone-formoterol (DULERA) 100-5 MCG/ACT AERO   Inhale 2 puffs into the lungs 2 (two) times daily.            Marland Kitchen   oxybutynin (DITROPAN-XL) 10 MG 24 hr tablet   Take 15 mg by mouth daily.             .   Polyvinyl Alcohol-Povidone (REFRESH OP)   Place 1 drop 2 (two) times daily into both eyes.             .   pravastatin (PRAVACHOL) 10 MG tablet   Take 10 mg by mouth daily.            .   predniSONE (DELTASONE) 1 MG tablet   Take 3 mg by mouth daily.             .   pregabalin (LYRICA) 75 MG capsule   Take 75 mg 3 (three) times daily by mouth.             .   psyllium (METAMUCIL MULTIHEALTH FIBER) 58.6 % powder   Take 1 packet by mouth 2 (two) times daily.            .   sucralfate (CARAFATE) 1 GM/10ML suspension   Take 1 g by mouth 2 (two) times daily.            Marland Kitchen   topiramate (TOPAMAX) 25 MG  tablet   Take 75 mg by mouth at bedtime.            .   traMADol (ULTRAM) 50 MG tablet   TAKE 1 TO 2 TABLETS BY MOUTH EVERY 6 HOURS AS NEEDED FOR PAIN**PA REQ** (Patient taking differently: Take 50-100 mg by mouth every 6 (six) hours as needed (pain). )   40 tablet   0    .   Vitamin D, Ergocalciferol, (DRISDOL) 1.25 MG (50000 UNIT) CAPS capsule   Take 50,000 Units by mouth every 7 (seven) days.                  Drug Regimen Review   Drug regimen was reviewed and remains appropriate with no significant issues identified     Home:  Home Living  Family/patient expects to be discharged to:: Private residence  Living Arrangements: Non-relatives/Friends  Available Help at Discharge: Family, Available PRN/intermittently  Type of Home: House  Home Access: Stairs to enter  Technical brewer of Steps: 8  Entrance Stairs-Rails: Left  Home Layout: One level  Bathroom Shower/Tub: Administrator, Civil Service: Handicapped height  Home Equipment: Civil engineer, contracting, Hand held shower head, Bedside commode, Cane - single point, Environmental consultant - 4 wheels     Functional History:  Prior Function  Level of Independence: Independent with assistive device(s)  Comments: ambulates with a cane intermittently; independent with BADL     Functional Status:   Mobility:  Bed Mobility  Overal bed mobility: Needs Assistance  Bed Mobility: Supine to Sit, Sit to Supine  Rolling: Mod assist, +2  for physical assistance  Supine to sit: HOB elevated, Mod assist  Sit to supine: Mod assist, HOB elevated, +2 for physical assistance  General bed mobility comments: min A at trunk for supine to sit, once pt slid LE's off bed. Min A to position squarely at EOB. Mod A for LE's for return to supine. Pt has difficulty moving in current bed due to body habitus, would benefit from wider bed.   Transfers  Overall transfer level: Needs assistance  Equipment used: Rolling  walker (2 wheeled)  Transfer via Lift Equipment: (back of recliner)  Transfers: Sit to/from Stand  Sit to Stand: +2 physical assistance, From elevated surface, Min assist  General transfer comment: declined standing due to fatigue  Ambulation/Gait  Ambulation/Gait assistance: +2 safety/equipment, Min assist  Gait Distance (Feet): 4 Feet  Assistive device: Rolling walker (2 wheeled)  Gait Pattern/deviations: Wide base of support, Trunk flexed, Shuffle  General Gait Details: pt has difficulty stepping feet, improved with cueing and needed continued encouragement  Gait velocity: decr  Gait velocity interpretation: <1.31 ft/sec, indicative of household ambulator       ADL:  ADL  Overall ADL's : Needs assistance/impaired  Eating/Feeding: Set up, Sitting  Grooming: Wash/dry hands, Wash/dry face, Oral care, Sitting, Minimal assistance  Upper Body Bathing: Sitting, Moderate assistance  Lower Body Bathing: Maximal assistance, Sit to/from stand  Upper Body Dressing : Moderate assistance, Sitting  Lower Body Dressing: Maximal assistance, Sit to/from stand, Sitting/lateral leans  Toilet Transfer: Maximal assistance, +2 for physical assistance, +2 for safety/equipment, Stand-pivot  Toilet Transfer Details (indicate cue type and reason): max A for sit <> stand only this date  Toileting- Clothing Manipulation and Hygiene: Maximal assistance, Sit to/from stand  Functional mobility during ADLs: Maximal assistance(max A to pull up on back of recliner for sit <> stand only)  General ADL Comments: pt with VERY flat affect.  Will need post acute rehab     Cognition:  Cognition  Overall Cognitive Status: Within Functional Limits for tasks assessed  Orientation Level: Oriented X4  Cognition  Arousal/Alertness: Awake/alert  Behavior During Therapy: Flat affect  Overall Cognitive Status: Within Functional Limits for tasks assessed  General Comments: VERY flat  affect        Blood pressure 140/69, pulse 79, temperature 98.5 F (36.9 C), resp. rate 18, height '5\' 8"'$  (1.727 m), weight (!) 193.4 kg, last menstrual period 03/24/2017, SpO2 95 %.  Physical Exam   Nursing note and vitals reviewed.  Constitutional: She is oriented to person, place, and time.  Morbidly obese female with BMI of 64+ sititng up in bed; calling nurse for pain meds (didn't specifically ask, but insisted nurse come "now".) Has purewick; dark cola colored urine, NAD   HENT:   Head: Normocephalic and atraumatic.   Nose: Nose normal.   Mouth/Throat: Oropharynx is clear and moist. No oropharyngeal exudate.   Eyes: Pupils are equal, round, and reactive to light. Conjunctivae and EOM are normal. Right eye exhibits no discharge. Left eye exhibits no discharge.   Neck: No tracheal deviation present.   Cardiovascular: Normal rate and regular rhythm.   No murmur heard.  Respiratory: Effort normal. No stridor. No respiratory distress. She has wheezes (occasional upper airway sounds). She has no rales.   GI: Soft. Bowel sounds are normal. There is no rebound and no guarding.  Soft, protuberant, mildly TTP diffusely; ND, (+) hypoactive BS  Genitourinary:    Genitourinary Comments: Had purewick- dark cola colored urine    Musculoskeletal:  General: No tenderness or edema.     Cervical back: Normal range of motion and neck supple.     Comments: B/L UEs- deltoi 4-/5, biceps 4/5, triceps 4/5, WE 4/5, grip 4/5, finger abd 4-/5  LEs- HF 2+/5, KE 2+/5, DF/PF 4-/5 Very large LEs- hard to move for examiner or pt.   Neurological: She is alert and oriented to person, place, and time. No cranial nerve deficit. Coordination normal.  Sensation intact in all 4 extremities to light touch    Skin: Skin is warm and dry.  Did not see any wounds- not able to turn to look at backside Right breast intertriginous area with large area of denuded tissue with white eschar. Area under  left breast moist and macerated appearing--tender to touch.   1 IV in L forearm- no signs of infiltration  Psychiatric:  Slow to process/respond Flat affect         Lab Results Last 48 Hours          Results for orders placed or performed during the hospital encounter of 08/23/19 (from the past 48 hour(s))    Glucose, capillary     Status: Abnormal        Collection Time: 08/27/19 11:33 AM    Result   Value   Ref Range        Glucose-Capillary   275 (H)   70 - 99 mg/dL    Glucose, capillary     Status: Abnormal        Collection Time: 08/27/19  3:51 PM    Result   Value   Ref Range        Glucose-Capillary   138 (H)   70 - 99 mg/dL    Glucose, capillary     Status: Abnormal        Collection Time: 08/27/19  7:57 PM    Result   Value   Ref Range        Glucose-Capillary   182 (H)   70 - 99 mg/dL        Comment 1   Notify RN            Comment 2   Document in Chart        Glucose, capillary     Status: Abnormal        Collection Time: 08/28/19 12:06 AM    Result   Value   Ref Range        Glucose-Capillary   168 (H)   70 - 99 mg/dL        Comment 1   Notify RN            Comment 2   Document in Chart        Glucose, capillary     Status: Abnormal        Collection Time: 08/28/19  4:12 AM    Result   Value   Ref Range        Glucose-Capillary   104 (H)   70 - 99 mg/dL        Comment 1   Notify RN            Comment 2   Document in Chart        Glucose, capillary     Status: Abnormal        Collection Time: 08/28/19  7:56 AM    Result   Value   Ref Range  Glucose-Capillary   146 (H)   70 - 99 mg/dL    Glucose, capillary     Status: Abnormal        Collection Time: 08/28/19 10:16 AM    Result   Value   Ref Range        Glucose-Capillary   154 (H)   70 - 99 mg/dL    Glucose,  capillary     Status: Abnormal        Collection Time: 08/28/19 12:18 PM    Result   Value   Ref Range        Glucose-Capillary   133 (H)   70 - 99 mg/dL    Glucose, capillary     Status: Abnormal        Collection Time: 08/28/19  4:20 PM    Result   Value   Ref Range        Glucose-Capillary   225 (H)   70 - 99 mg/dL    Glucose, capillary     Status: Abnormal        Collection Time: 08/28/19  8:08 PM    Result   Value   Ref Range        Glucose-Capillary   167 (H)   70 - 99 mg/dL        Comment 1   Notify RN            Comment 2   Document in Chart        Glucose, capillary     Status: Abnormal        Collection Time: 08/29/19 12:12 AM    Result   Value   Ref Range        Glucose-Capillary   154 (H)   70 - 99 mg/dL        Comment 1   Notify RN            Comment 2   Document in Chart        Glucose, capillary     Status: Abnormal        Collection Time: 08/29/19  4:07 AM    Result   Value   Ref Range        Glucose-Capillary   132 (H)   70 - 99 mg/dL        Comment 1   Notify RN            Comment 2   Document in Chart        Glucose, capillary     Status: Abnormal        Collection Time: 08/29/19  5:56 AM    Result   Value   Ref Range        Glucose-Capillary   107 (H)   70 - 99 mg/dL    Glucose, capillary     Status: None        Collection Time: 08/29/19  7:58 AM    Result   Value   Ref Range        Glucose-Capillary   94   70 - 99 mg/dL          Imaging Results (Last 48 hours)                   Medical Problem List and Plan:  1.  Impaired mobility, ADLs and function secondary to debility due to hemrrhagic shock due to bleeding adenoma, AKI, and DKA              -  patient may  shower              -ELOS/Goals: 7-10 days/ supervision to min assist  2.   Antithrombotics:  -DVT/anticoagulation:  Mechanical: Sequential compression devices, below knee Bilateral lower extremities   -would benefit from Lovenox- will see if possible since her size and immobility              -antiplatelet therapy: N/A  3. Pain Management: Need to educated/encourage patient to work thorough pain as > 7 days post embolization. Continue MS contin with MSIR    4. Mood: LCSW to follow for evaluation and support.               -antipsychotic agents: N/A  5. Neuropsych: This patient is capable of making decisions on her own behalf.  6. Skin/Wound Care:  Antimicrobial fabric to absorb moisture to assist in management of MASD.   7. Fluids/Electrolytes/Nutrition: Continue Ensure max. Will add prostat for depleted protein stores.   8. Liver adenomas with bleeding s/p embolization: Continue to monitor H/H.  9. OSA/OHS: Continue 2L oxygen per [place]--encourage CPAP at nights. Continue Dulera bid  10. T2DM: Hgb A1c- 8.6--> improving/sees Dr. Loanne Drilling. Continue to monitor BS ac/hs and titrate lantus as indicated--intake poor at this time. Continue Lantus with SSI   11. Acute on chronic renal failure?: SCR improved from 3.10-->1.29  12. Chronic combined systolic CHF: Check weights daily. Monitor for signs of overload. On Entresto, pravastatin, Bidil and coreg.   13. H/o Lupus: On Plaquenil bid with prednisone. On Lyrica for pain management.    14. H/o anxiety/depression: On Wellbutrin and cymbalta with Xanax prn. - getting over recent death of brother.   15. Hematuria?/Vaginal bleeding?: Nuvaring out PTA. Reports heavy bleeding off megestrol--?resume.      Bary Leriche, PA-C  08/29/2019      I have personally performed a face to face diagnostic evaluation of this patient and formulated the key components of the plan.  Additionally, I have personally reviewed laboratory data, imaging studies, as well as relevant notes and concur with the physician assistant's  documentation above.     The patient's status has not changed from the original H&P.  Any changes in documentation from the acute care chart have been noted above.                     Revision History                                               Routing History

## 2019-08-29 NOTE — Progress Notes (Signed)
[name], MD  Physician  Physical Medicine and Rehabilitation  PMR Pre-admission  Signed  Date of Service:  08/28/2019  4:24 PM      Related encounter: Admission (Current) from 08/23/2019 in [place] [facility]      Signed        PMR Admission Coordinator Pre-Admission Assessment   Patient: [name] is an 49 y.o., female MRN: [id number] DOB: 05/26/1971 Height: 5' 8"  (172.7 cm) Weight: (!) 193.4 kg   Insurance Information HMO:     PPO:      PCP:      IPA:      80/20:      OTHER:  PRIMARY: Medicaid [place]      Policy#: [id number]      Subscriber: Patient Coverage Code: [id number] CM Name:       Phone#:      Fax#:  Pre-Cert#:       Employer:  Benefits:  Phone #: verified via automated number: [phone]     Name: verified eligibility online via [facility] on 08/27/19  Eff. Date: verified eligibility 08/27/19     Deduct:       Out of Pocket Max:       Life Max:  CIR: Covered per Medicaid guidelines      SNF:  Outpatient:      Co-Pay:  Home Health:       Co-Pay:  DME:      Co-Pay:  Providers:  SECONDARY:       Policy#:       Subscriber: CM Name:       Phone#:      Fax#:  Pre-Cert#:       Employer:  Benefits:  Phone #:      Name:  Eff. Date:      Deduct:       Out of Pocket Max:       Life Max: CIR:       SNF:  Outpatient:      Co-Pay:  Home Health:       Co-Pay:  DME:      Co-Pay:    Medicaid Application Date:       Case Manager: Disability Application Date:       Case Worker:    The "Data Collection Information Summary" for patients in Inpatient Rehabilitation Facilities with attached "Privacy Act [place] Records" was provided and verbally reviewed with: N/A   Emergency Contact Information         Contact Information     Name Relation Home Work Mobile    [place] Son [place]    [name], [name] Sister     [phone]    [name], [name] Sister     [phone]         Current Medical History  Patient Admitting  Diagnosis: Debility   History of Present Illness: Pt is a 49 yo female with history of morbid obesity, Sjogren's syndrome, lupus, systolic heart failure, type 2 DM, gastroparesis, dyslipidemia, history of CVA, and obstructive sleep apnea. Pt initially presented to [facility] on 08/12/19 with a two day span of nausea, vomiting, and abdominal pain. Her workup revealed DKA and hemorrhagic shock due to bleeding hepatic adenoma and resulting AKI. On 08/18/19 pt was transferred to  [facility] for further management where she underwent an embolization of the right lobe adenomas. Pt did have post op pain with need for PCA pump and was transferred back to [facility] of 08/23/19  for further management. Pt has weaned off pain pump and been evaluated by therapies with recommendation for CIR. Pt is to admit to CIR on 08/29/19.   Patient's medical record from [facility] has been reviewed by the rehabilitation admission coordinator and physician.   Past Medical History      Past Medical History:  Diagnosis Date  . Acute renal failure (HCC)       Intractable nausea vomiting secondary to diabetic gastroparesis causing dehydration and acute renal failure /notes 04/01/2013  . Anginal pain ([place])    . Anxiety    . Bell's palsy 08/09/2010  . CAP (community acquired pneumonia) 10/2011    Archie Endo 11/08/2011  . Chest pain 01/13/2014  . CHF (congestive heart failure) ([place])    . Diabetic gastroparesis associated with type 2 diabetes mellitus ([place])      this is presumed diagnoses, not confirmed by any studies.   . Eczema    . Family history of malignant neoplasm of breast    . Family history of malignant neoplasm of ovary    . GERD (gastroesophageal reflux disease)    . High cholesterol    . Hypertension    . Liver lesion 08/13/2019  . Liver mass 2014    biopsied 03/2013 at [facility], not malignant.  is to undergo radiologic ablation of the mass in sept/October 2014.   . Lupus ([place])    . Migraines      "maybe a  couple times/yr" (04/01/2013)  . Obesity    . Obstructive sleep apnea on CPAP 2011  . On home oxygen therapy      "2L 24/7" (02/28/2015)  . SJOGREN'S SYNDROME 08/09/2010  . SLE (systemic lupus erythematosus) ([place])      Archie Endo 12/01/2010  . Stroke [facility]) 2010; 10/2012    "left side is still weak from it, never fully regained full strength; no additions from stroke 10/2012"      Family History   family history includes Arthritis in an other family member; Asthma in her brother, brother, and mother; Breast cancer in her cousin and maternal aunt; Breast cancer (age of onset: 62) in her maternal aunt; Cancer in her maternal aunt, maternal aunt, and paternal aunt; Diabetes in her mother; Hypertension in her mother and another family member; Ovarian cancer in her maternal aunt; Ovarian cancer (age of onset: 18) in her sister; Prostate cancer in her maternal uncle and paternal uncle; Stomach cancer in her maternal uncle; Stroke in an other family member; Urticaria in her mother.   Prior Rehab/Hospitalizations Has the patient had prior rehab or hospitalizations prior to admission? No   Has the patient had major surgery during 100 days prior to admission? No               Current Medications   Current Facility-Administered Medications:  .  0.9 %  sodium chloride infusion, 250 mL, Intravenous, PRN, [name], [name], MD .  acetaminophen (TYLENOL) tablet 650 mg, 650 mg, Oral, Q6H PRN **OR** acetaminophen (TYLENOL) suppository 650 mg, 650 mg, Rectal, Q6H PRN, [name], [name], MD .  albuterol (PROVENTIL) (2.5 MG/3ML) 0.083% nebulizer solution 2.5 mg, 2.5 mg, Inhalation, Q6H PRN, [name], [name], MD .  ALPRAZolam Duanne Moron) tablet 0.5 mg, 0.5 mg, Oral, BID PRN, [name], [name], MD .  amLODipine (NORVASC) tablet 5 mg, 5 mg, Oral, Daily, [name], [name], MD, 5 mg at 08/29/19 1104 .  buPROPion (WELLBUTRIN XL) 24 hr tablet 300 mg, 300 mg, Oral, Daily, [name], [name], MD, 300 mg  at 08/29/19 1104 .   carvedilol (COREG) tablet 12.5 mg, 12.5 mg, Oral, BID WC, [name], [name], MD, 12.5 mg at 08/29/19 1106 .  DULoxetine (CYMBALTA) DR capsule 120 mg, 120 mg, Oral, QHS, [name], [name], MD, 120 mg at 08/28/19 2102 .  famotidine (PEPCID) tablet 20 mg, 20 mg, Oral, BID, [name], [name], MD, 20 mg at 08/29/19 1105 .  hydroxychloroquine (PLAQUENIL) tablet 200 mg, 200 mg, Oral, BID, [name], [name], MD, 200 mg at 08/29/19 1120 .  insulin aspart (novoLOG) injection 0-15 Units, 0-15 Units, Subcutaneous, Q4H, [name], [name], MD, 2 Units at 08/29/19 1128 .  insulin glargine (LANTUS) injection 37 Units, 37 Units, Subcutaneous, QHS, Donnamae Jude, MD, 37 Units at 08/28/19 2057 .  isosorbide-hydrALAZINE (BIDIL) 20-37.5 MG per tablet 2 tablet, 2 tablet, Oral, TID, [name], [name], MD, 2 tablet at 08/29/19 1104 .  levothyroxine (SYNTHROID) tablet 50 mcg, 50 mcg, Oral, Q0600, Osei-Bonsu, Iona Beard, MD, 50 mcg at 08/29/19 0558 .  loratadine (CLARITIN) tablet 10 mg, 10 mg, Oral, Daily, [name], [name], MD, 10 mg at 08/29/19 1105 .  mometasone-formoterol (DULERA) 100-5 MCG/ACT inhaler 2 puff, 2 puff, Inhalation, BID, Arrien, Jimmy Picket, MD, 2 puff at 08/29/19 0906 .  morphine (MS CONTIN) 12 hr tablet 15 mg, 15 mg, Oral, Q12H, Arrien, Jimmy Picket, MD, 15 mg at 08/29/19 1105 .  morphine (MSIR) tablet 15 mg, 15 mg, Oral, Q6H PRN, Donnamae Jude, MD, 15 mg at 08/28/19 1633 .  multivitamin with minerals tablet 1 tablet, 1 tablet, Oral, Daily, Osei-Bonsu, George, MD, 1 tablet at 08/29/19 1104 .  ondansetron (ZOFRAN) tablet 4 mg, 4 mg, Oral, Q6H PRN **OR** ondansetron (ZOFRAN) injection 4 mg, 4 mg, Intravenous, Q6H PRN, [name], [name], MD .  oxybutynin (DITROPAN XL) 24 hr tablet 15 mg, 15 mg, Oral, Daily, [name], [name], MD, 15 mg at 08/29/19 1120 .  pravastatin (PRAVACHOL) tablet 10 mg, 10 mg, Oral, Daily, [name], [name], MD, 10 mg at 08/29/19 1105 .  predniSONE (DELTASONE)  tablet 3 mg, 3 mg, Oral, Daily, [name], [name], MD, 3 mg at 08/29/19 1119 .  pregabalin (LYRICA) capsule 75 mg, 75 mg, Oral, TID, [name], [name], MD, 75 mg at 08/29/19 1103 .  protein supplement (ENSURE MAX) liquid, 11 oz, Oral, BID, Osei-Bonsu, George, MD, 11 oz at 08/29/19 1107 .  sacubitril-valsartan (ENTRESTO) 97-103 mg per tablet, 1 tablet, Oral, BID, [name], [name], MD, 1 tablet at 08/29/19 1120 .  sodium chloride flush (NS) 0.9 % injection 3 mL, 3 mL, Intravenous, Q12H, [name], [name], MD, 3 mL at 08/29/19 1121 .  sodium chloride flush (NS) 0.9 % injection 3 mL, 3 mL, Intravenous, PRN, [name], [name], MD .  sucralfate (CARAFATE) 1 GM/10ML suspension 1 g, 1 g, Oral, BID, [name], [name], MD, 1 g at 08/29/19 1107 .  topiramate (TOPAMAX) tablet 75 mg, 75 mg, Oral, QHS, [name], [name], MD, 75 mg at 08/28/19 2102 .  traMADol (ULTRAM) tablet 50-100 mg, 50-100 mg, Oral, Q6H PRN, Donnamae Jude, MD, 50 mg at 08/28/19 1418   Patients Current Diet:     Diet Order                      Diet Carb Modified Fluid consistency: Thin; Room service appropriate? Yes  Diet effective now                   Precautions / Restrictions Precautions Precautions: Fall Precaution Comments: PCA pump Restrictions Weight Bearing Restrictions: No    Has the  patient had 2 or more falls or a fall with injury in the past year? No   Prior Activity Level Community (5-7x/wk): not working but Independent in ADLs and drove PTA. pt would run her own errands. She did have an aide 2 hrs/month mostly for IADLs.    Prior Functional Level Self Care: Did the patient need help bathing, dressing, using the toilet or eating? Independent   Indoor Mobility: Did the patient need assistance with walking from room to room (with or without device)? Independent   Stairs: Did the patient need assistance with internal or external stairs (with or without device)? Independent   Functional  Cognition: Did the patient need help planning regular tasks such as shopping or remembering to take medications? [place] / Equipment Home Assistive Devices/Equipment: Cane (specify quad or straight), Walker (specify type) Home Equipment: Shower seat, Hand held shower head, Bedside commode, Cane - single point, Walker - 4 wheels   Prior Device Use: Indicate devices/aids used by the patient prior to current illness, exacerbation or injury? None of the above   Current Functional Level Cognition   Overall Cognitive Status: Within Functional Limits for tasks assessed Orientation Level: Oriented X4 General Comments: VERY flat affect    Extremity Assessment (includes Sensation/Coordination)   Upper Extremity Assessment: Generalized weakness  Lower Extremity Assessment: Generalized weakness     ADLs   Overall ADL's : Needs assistance/impaired Eating/Feeding: Set up, Sitting Grooming: Wash/dry hands, Wash/dry face, Oral care, Sitting, Minimal assistance Upper Body Bathing: Sitting, Moderate assistance Lower Body Bathing: Maximal assistance, Sit to/from stand Upper Body Dressing : Moderate assistance, Sitting Lower Body Dressing: Maximal assistance, Sit to/from stand, Sitting/lateral leans Toilet Transfer: Maximal assistance, +2 for physical assistance, +2 for safety/equipment, Stand-pivot Toilet Transfer Details (indicate cue type and reason): max A for sit <> stand only this date Toileting- Clothing Manipulation and Hygiene: Maximal assistance, Sit to/from stand Functional mobility during ADLs: Maximal assistance(max A to pull up on back of recliner for sit <> stand only) General ADL Comments: pt with VERY flat affect.  Will need post acute rehab     Mobility   Overal bed mobility: Needs Assistance Bed Mobility: Supine to Sit, Sit to Supine Rolling: Mod assist, +2 for physical assistance Supine to sit: HOB elevated, Mod assist Sit to supine: Mod assist, HOB  elevated, +2 for physical assistance General bed mobility comments: min A at trunk for supine to sit, once pt slid LE's off bed. Min A to position squarely at EOB. Mod A for LE's for return to supine. Pt has difficulty moving in current bed due to body habitus, would benefit from wider bed.      Transfers   Overall transfer level: Needs assistance Equipment used: Rolling walker (2 wheeled) Transfer via Lift Equipment: (back of recliner) Transfers: Sit to/from Stand Sit to Stand: +2 physical assistance, From elevated surface, Min assist General transfer comment: declined standing due to fatigue     Ambulation / Gait / Stairs / Emergency planning/management officer   Ambulation/Gait Ambulation/Gait assistance: +2 safety/equipment, Min assist Gait Distance (Feet): 4 Feet Assistive device: Rolling walker (2 wheeled) Gait Pattern/deviations: Wide base of support, Trunk flexed, Shuffle General Gait Details: pt has difficulty stepping feet, improved with cueing and needed continued encouragement Gait velocity: decr Gait velocity interpretation: <1.31 ft/sec, indicative of household ambulator     Posture / Balance Balance Overall balance assessment: Needs assistance Sitting-balance support: Feet supported, No upper extremity supported Sitting balance-Leahy Scale: [place] Standing  balance support: During functional activity Standing balance-Leahy Scale: Poor Standing balance comment: did not stand     Special needs/care consideration BiPAP/CPAP : yes, CPAP CPM : no Continuous Drip IV : no Dialysis : no        Days : no Life Vest : no Oxygen : on RA Special Bed : no Trach Size : no Wound Vac (area) : no      Location : no Skin: cracking to right and breast, groin (right; left); MASD to breast, groin (right; left); wound to left and right breast, wound to mid, lower sternum.  Bowel mgmt: last BM 08/24/19, continent Bladder mgmt: continent, external catheter in place Diabetic mgmt: yes Behavioral  consideration : delayed responses Chemo/radiation : no    Previous Home Environment (from acute therapy documentation) Living Arrangements: Non-relatives/Friends Available Help at Discharge: Family, Available PRN/intermittently Type of Home: House Home Layout: One level Home Access: Stairs to enter Entrance Stairs-Rails: Left Entrance Stairs-Number of Steps: 8 Bathroom Shower/Tub: Chiropodist: Handicapped height Home Care Services: Yes Type of Home Care Services: Home RN   Discharge Living Setting Plans for Discharge Living Setting: Patient's home, Lives with (comment)(lives with roommate Lenny ) Type of Home at Discharge: House Discharge Home Layout: One level Discharge Home Access: Stairs to enter Entrance Stairs-Rails: Can reach both Entrance Stairs-Number of Steps: 3 steps then landing then 4 steps Discharge Bathroom Shower/Tub: Tub/shower unit Discharge Bathroom Toilet: Standard Discharge Bathroom Accessibility: Yes How Accessible: Accessible via walker Does the patient have any problems obtaining your medications?: No   Social/Family/Support Systems Patient Roles: Other (Comment)(has family close that lives nearby; has roommate. ) Sport and exercise psychologist Information: Tawnya Crook (sister): [phone] Anticipated Caregiver: sister Darin Engels (roommate), neice Merlyn Lot, cousin Lelan Pons Anticipated Ambulance person Information: see above for Teasha's number Ability/Limitations of Caregiver: Min A Caregiver Availability: 24/7 Discharge Plan Discussed with Primary Caregiver: Gwyndolyn Saxon) Is Caregiver In Agreement with Plan?: Yes Does Caregiver/Family have Issues with Lodging/Transportation while Pt is in Rehab?: No   Goals/Additional Needs Patient/Family Goal for Rehab: PT/OT: Supervision, SLP: NA  Expected length of stay: 7-10 days  Cultural Considerations: TBD Dietary Needs: carb modified, thin liquids; calorie level medium: 1600-2000. Equipment Needs:  TBD Special Service Needs: bariatric equipment Pt/Family Agrees to Admission and willing to participate: Yes Program Orientation Provided & Reviewed with Pt/Caregiver Including Roles  & Responsibilities: Yes(pt and her sister Tawnya Crook)  Barriers to Discharge: Home environment access/layout, Insurance for SNF coverage, Weight  Barriers to Discharge Comments: steps to enter home. while on Acute side, no agency had accepted [facility] for [facility] services as backup-may be difficult to find home health services   Decrease burden of Care through IP rehab admission: NA   Possible need for SNF placement upon discharge: Not anticipated. Pt has good social support per family for assistance at DC. Anticipate pt can get back to Supervision level through CIR stay and return home safely.    Patient Condition: I have reviewed medical records from [facility], spoken with CSW, and patient and family member. I met with patient at the bedside for inpatient rehabilitation assessment.  Patient will benefit from ongoing PT and OT, can actively participate in 3 hours of therapy a day 5 days of the week, and can make measurable gains during the admission.  Patient will also benefit from the coordinated team approach during an Inpatient Acute Rehabilitation admission.  The patient will receive intensive therapy as well as Rehabilitation physician, nursing, social worker, and care  management interventions.  Due to safety, skin/wound care, disease management, medication administration, pain management and patient education the patient requires 24 hour a day rehabilitation nursing.  The patient is currently Min A + 2 for 4 feet with RW with mobility and Mod A to Max A + 2 for basic ADLs.  Discharge setting and therapy post discharge at home with home health is anticipated.  Patient has agreed to participate in the Acute Inpatient Rehabilitation Program and will admit 08/29/19.   Preadmission Screen Completed By:  Raechel Ache,  08/29/2019 12:01 PM ______________________________________________________________________   Discussed status with Dr. Dagoberto Ligas on 08/29/19 at 9:36AM and received approval for admission today.   Admission Coordinator:  Raechel Ache, OT, time 9:36AM/Date 08/29/19    Assessment/Plan: Diagnosis: 1. Does the need for close, 24 hr/day Medical supervision in concert with the patient's rehab needs make it unreasonable for this patient to be served in a less intensive setting? Yes 2. Co-Morbidities requiring supervision/potential complications: extreme morbid obesity with BMI of 65, Systolic and diastolic CHF, DM with I2M of 8.6- came in to hospital with DKA and AKI; obesity hypoventilation syndrome; hx of CVA; OSA on BiPAP, lupus, sjogren's disease 3. Due to bladder management, bowel management, skin/wound care, disease management, medication administration, pain management and patient education, does the patient require 24 hr/day rehab nursing? Yes 4. Does the patient require coordinated care of a physician, rehab nurse, PT, OT, and SLP to address physical and functional deficits in the context of the above medical diagnosis(es)? Yes Addressing deficits in the following areas: balance, endurance, locomotion, strength, transferring, bathing, dressing, feeding, grooming, toileting and psychosocial support 5. Can the patient actively participate in an intensive therapy program of at least 3 hrs of therapy 5 days a week? Yes 6. The potential for patient to make measurable gains while on inpatient rehab is fair 7. Anticipated functional outcomes upon discharge from inpatient rehab: supervision PT, supervision OT, n/a SLP 8. Estimated rehab length of stay to reach the above functional goals is: 7-10 days 9. Anticipated discharge destination: Home 10. Overall Rehab/Functional Prognosis: fair     MD Signature:          Revision History Date/Time User Provider Type Action  08/29/2019 12:13 PM [name],  MD Physician Sign  08/29/2019 12:01 PM Raechel Ache, [place] [facility] Admission Coordinator Share   View Details Report

## 2019-08-29 NOTE — Discharge Summary (Signed)
Physician Discharge Summary  [name] [id number] DOB: [birth year] DOA: 08/23/2019  PCP: Bartholome Bill, MD  Admit date: 08/23/2019 Discharge date: 08/29/2019  Admitted From: Home Disposition: CIR   Recommendations for Outpatient Follow-up:  1. Follow up with PCP in 1-2 weeks 2. Follow up with [place] [facility] as an outpatient and repeat MRI of Liver in 6 months  3. Resume aspirin and Plavix when clinically appropriate and cleared by gastroenterology 4. Resume torsemide and spironolactone when AKI is improved 5. Please obtain CMP/CBC, Mag, Phos in one week 6. Please follow up on the following pending results:  Home Health: No Equipment/Devices: None  Discharge Condition: Stable  CODE STATUS: FULL CODE Diet recommendation: Heart Healthy Carb Modified Diet   Brief/Interim Summary: The patient 49 year old female who was transferred from [facility] but initially was hospitalized here. She does have significant past medical history for morbid obesity, Sjogren's syndrome, lupus, systolic heart failure, type 2 diabetes mellitus, gastroparesis, dyslipidemia, history of CVA and obstructive sleep apnea.  Patient initially presented to [facility] August 12, 2019 with 2-day history of nausea, vomiting and abdominal pain. She was diagnosed with DKA and with hemorrhagic shock due to bleeding hepatic adenoma and resultant AKIJanuary 17 patient was transferred to [facility] for further management. January 20 she underwent embolization of right lobe adenomas with improvement of bleeding.  Postprocedure patient continued to have abdominal pain, she was placed on a PCA pump for further pain control and transferred back to [facility](08/23/19)for further pain management.  Patient has been wean off PCU pump with good toleration, physicaltherapy hasrecommendedinpatient rehabilitation evaluation.   Her hemoglobin/hematocrit have been stable and pain is relatively better controlled.   We will attempted to start weaning her off of her opioids but she is currently on scheduled morphine with breakthrough morphine and tramadol for moderate pain.  At some point her Plavix and aspirin will need to be resumed given her history of CVA but will be held currently given her recent acute blood loss anemia and hemoperitoneum.  Will need hepatology clearance prior to reinitiation.  She is currently stable to be transferred to inpatient rehabilitation and will have further care there.  Discharge Diagnoses:  Principal Problem:   Liver hemorrhage Active Problems:   SLE   SJOGREN'S SYNDROME   HTN (hypertension)   Chronic respiratory failure with hypoxia (HCC)   Morbid obesity (HCC)   Liver masses   Diabetic gastroparesis (HCC)   HLD (hyperlipidemia)   Diabetes (HCC)   Hepatic adenoma   Hemoperitoneum   Acute blood loss anemia   Obesity hypoventilation syndrome (HCC)   Skin breakdown   Essential hypertension  S/p bleeding hepatic adenoma/ acute blood loss anemia/ sp embolization 01/20 ([facility]). -Patient has been transitioned to oralmorphine 15 mg bid and q6h as needed for breakthrough -IV hydromorphone q2H, with good toleration and this is now stopped. -Will need Judicious use of Opioids and will need Tapering and weaning off soon  -On lyrica and tramadol for moderate pain at home, she continues to have pain but is improving.  -C/w Tramadol 50-100 mg po q6hprn for Moderate Pain -Hb/Hct is relatively stable at 8.3/26.6 today -Will continue neuro checks per unit protocol and mobility as tolerated.  -Will consult inpatient rehab for evaluation and is agreeable and has a bed today so will D/C today  -C/w Bowel regimen in addition to her pain control and start Pain Control weaning -Continue to Monitor for S/Sx of Bleeding -Continue to Hold Plavix and Aspirin for  now and resume when clinically appropriate   Uncontrolled T2DM (Hgb A1c 8,6) s/p DKA. Diabetic Gastroparesis -Fasting  glucose this am is129, -patient tolerating po well,  -plan tocontinue insulin sliding scale for glucose cover and monitoring, plus a basal insulindose of35 units qhs.  -CBG's ranging from 94-225 -Increased Lantus to 37 units while hospitalized -Resume Home Insulin NPH at D/C   Dyslipidemia/HLD -C/w Pravastatin.   Chronic Combined Systolic and Diastolic CHF -Currently not decompensated  -Continue heart failure management withCoreg Bidil and Entresto  -Blood pressure systolic [id number] mmHg. -Strict I's and O's and Daily Weights -Continue to Monitor for S/Sx of Volume Overload -Consider resuming Torsemide and Spironolactone at D/C when renal Fxn Recovers  Hx of CVA. Inpatient rehab consultation. -Currently holding Plavix -C/w Statin -May resume ASA in a week or so given her Acute Blood Loss and bleeding Hepatic Adenoma and Hemoperitoneum   Super MorbidObesity/ obesity hypoventilation syndrome. Chronic Respiratory Failure with Hypoxia  -Estimated body mass index is 64.83 kg/m as calculated from the following:   Height as of this encounter: 5\' 8"  (1.727 m).   Weight as of this encounter: 193.4 kg. -Weight Loss and Dietary Counseling given -Onbipap at night with good toleration. Wears 2 Liters Chronically  -Will need continued Dietary Education -Out of bed as tolerated. PT consulted  AKI, improving -Renal function with serum cr down to 1.29 from 2.64, K at 4.3 and serum bicarbonate at 26. -Avoid nephrotoxic medications, contrast dyes, hypotension and renally adjust medications -Will continue close monitoring of renal function and electrolytes -Resume Torsemide and Spironlactone when AKI is improved and cleared by Cardiology  -Repeat CMP at CIR  Depression and Anxiety -Onalprazolam 0.5 mg po BID as needed. -Continue withBupropion 300 mg po Daily, Duloxetine 120 mg po Daily at bedtime  and topiramate.   HTN -Oncarvedilol, Bidiland amlodipine, for blood pressure  control.  SLE/Sjrogen''s Syndrome -C/w Hydroxychloroquine and Prednisone 3 mg Daily. No currentsigns of acute flare -Resume Azathioprine as well .   Hypothyroidism  -On Levothyroxine 50 mcg po Daily    Skin breakdown at right breast  -Evaluated by Dr. Kennon Rounds yesterday and Wound care applied -C/w Mepilex foam dressing 2-3 days as needed   Discharge Instructions  Discharge Instructions    ([place]) Call MD:  Anytime you have any of the following symptoms: 1) 3 pound weight gain in 24 hours or 5 pounds in 1 week 2) shortness of breath, with or without a dry hacking cough 3) swelling in the hands, feet or stomach 4) if you have to sleep on extra pillows at night in order to breathe.   Complete by: As directed    Call MD for:  difficulty breathing, headache or visual disturbances   Complete by: As directed    Call MD for:  extreme fatigue   Complete by: As directed    Call MD for:  hives   Complete by: As directed    Call MD for:  persistant dizziness or light-headedness   Complete by: As directed    Call MD for:  persistant nausea and vomiting   Complete by: As directed    Call MD for:  redness, tenderness, or signs of infection (pain, swelling, redness, odor or green/yellow discharge around incision site)   Complete by: As directed    Call MD for:  severe uncontrolled pain   Complete by: As directed    Call MD for:  temperature >100.4   Complete by: As directed    Diet -  low sodium heart healthy   Complete by: As directed    Diet Carb Modified   Complete by: As directed    Discharge instructions   Complete by: As directed    You were cared for by a hospitalist during your hospital stay. If you have any questions about your discharge medications or the care you received while you were in the hospital after you are discharged, you can call the unit and ask to speak with the hospitalist on call if the hospitalist that took care of you is not available. Once you  are discharged, your primary care physician will handle any further medical issues. Please note that NO REFILLS for any discharge medications will be authorized once you are discharged, as it is imperative that you return to your primary care physician (or establish a relationship with a primary care physician if you do not have one) for your aftercare needs so that they can reassess your need for medications and monitor your lab values.  Follow up with PCP and [facility] as an outpatient. Take all medications as prescribed. Further care per CIR currently   Increase activity slowly   Complete by: As directed      Allergies as of 08/29/2019      Reactions   Metoclopramide Other (See Comments)   Developed restless leg, akathisia type limb movements.    Codeine Itching, Rash, Other (See Comments)   Penicillins Itching   Has patient had a PCN reaction causing immediate rash, facial/tongue/throat swelling, SOB or lightheadedness with hypotension: No Has patient had a PCN reaction causing severe rash involving mucus membranes or skin necrosis: No Has patient had a PCN reaction that required hospitalization: No Has patient had a PCN reaction occurring within the last 10 years:yes If all of the above answers are "NO", then may proceed with Cephalosporin use.   Hydrocodone Rash   Percocet [oxycodone-acetaminophen] Hives, Rash   Tolerates dilaudid      Medication List    TAKE these medications   Accu-Chek Aviva device Use as instructed   Accu-Chek Aviva Plus test strip Generic drug: glucose blood Use to check blood sugar 4 times daily. **Needs appt for refills**   Accu-Chek Softclix Lancets lancets Use to monitor glucose levels 4 times per day; E11.9   ALPRAZolam 0.5 MG tablet Commonly known as: XANAX Take 0.5 mg 2 (two) times daily as needed by mouth for anxiety or sleep.   amLODipine 5 MG tablet Commonly known as: NORVASC Take 1 tablet (5 mg total) by mouth daily.   Azathioprine 75 MG  Tabs Take 150 mg by mouth 2 (two) times daily.   B-D UF III MINI PEN NEEDLES 31G X 5 MM Misc Generic drug: Insulin Pen Needle USE AS DIRECTED TWICE A DAY   Easy Touch Pen Needles 32G X 4 MM Misc Generic drug: Insulin Pen Needle USE TO INJECT INSULIN TWICE DAILY   benzonatate 100 MG capsule Commonly known as: TESSALON Take by mouth continuous as needed for cough.   BiDil 20-37.5 MG tablet Generic drug: isosorbide-hydrALAZINE TAKE TWO (2) TABLETS BY MOUTH THREE TIMES DAILY What changed: See the new instructions.   buPROPion 300 MG 24 hr tablet Commonly known as: WELLBUTRIN XL Take 300 mg by mouth daily.   Byetta 10 MCG Pen 10 MCG/0.04ML Sopn injection Generic drug: exenatide INJECT 0.04ML UNDER THE SKIN 2 TIMES DAILY WITH A MEAL What changed: See the new instructions.   carvedilol 25 MG tablet Commonly known as: COREG Take 37.5  mg by mouth 2 (two) times daily with a meal.   cetirizine 10 MG tablet Commonly known as: ZYRTEC Take 10 mg by mouth daily as needed for allergies.   clotrimazole-betamethasone cream Commonly known as: LOTRISONE Apply 1 application topically 2 (two) times daily. What changed:   when to take this  reasons to take this   cyanocobalamin 1000 MCG/ML injection Commonly known as: (VITAMIN B-12) Inject into the muscle. Every other month   cycloSPORINE 0.05 % ophthalmic emulsion Commonly known as: RESTASIS Place 1 drop into both eyes 2 (two) times daily.   diclofenac sodium 1 % Gel Commonly known as: Voltaren Apply 2-4 g 4 (four) times daily as needed topically (joint pain).   doxepin 50 MG capsule Commonly known as: SINEQUAN Take 50-100 mg by mouth at bedtime as needed (sleep).   DULoxetine 60 MG capsule Commonly known as: CYMBALTA Take 120 mg by mouth at bedtime.   Ensure Max Protein Liqd Take 330 mLs (11 oz total) by mouth 2 (two) times daily.   etonogestrel-ethinyl estradiol 0.12-0.015 MG/24HR vaginal ring Commonly known as:  [place] 1 each every 28 (twenty-eight) days vaginally. Insert vaginally and leave in place for 3 consecutive weeks, then remove for 1 week.   famotidine 20 MG tablet Commonly known as: PEPCID Take 20 mg by mouth 2 (two) times daily.   fluticasone 50 MCG/ACT nasal spray Commonly known as: FLONASE Place 2 sprays into both nostrils continuous as needed for allergies or rhinitis.   hydroxychloroquine 200 MG tablet Commonly known as: PLAQUENIL Take 200 mg by mouth 2 (two) times daily.   insulin NPH Human 100 UNIT/ML injection Commonly known as: HumuLIN N Inject 2 mLs (200 Units total) into the skin every morning. And syringes 3/day What changed: how much to take   Insulin Syringe-Needle U-100 31G X 5/16" 1 ML Misc Commonly known as: Easy Touch Insulin Syringe USE AS DIRECTED THREE TIMES A DAY   levothyroxine 50 MCG tablet Commonly known as: SYNTHROID Take 1 tablet (50 mcg total) by mouth daily at 6 (six) AM. Start taking on: August 30, 2019   Lyrica 75 MG capsule Generic drug: pregabalin Take 75 mg 3 (three) times daily by mouth.   megestrol 40 MG tablet Commonly known as: MEGACE Take 40 mg by mouth daily.   Metamucil [employer] Fiber 58.6 % powder Generic drug: psyllium Take 1 packet by mouth 2 (two) times daily.   mometasone-formoterol 100-5 MCG/ACT Aero Commonly known as: DULERA Inhale 2 puffs into the lungs 2 (two) times daily.   morphine 15 MG 12 hr tablet Commonly known as: MS CONTIN Take 1 tablet (15 mg total) by mouth every 12 (twelve) hours.   morphine 15 MG tablet Commonly known as: MSIR Take 1 tablet (15 mg total) by mouth every 6 (six) hours as needed for severe pain.   multivitamin with minerals Tabs tablet Take 1 tablet by mouth daily. Start taking on: August 30, 2019   ondansetron 4 MG tablet Commonly known as: ZOFRAN Take 1 tablet (4 mg total) by mouth every 6 (six) hours as needed for nausea.   oxybutynin 10 MG 24 hr tablet Commonly  known as: DITROPAN-XL Take 15 mg by mouth daily.   pravastatin 10 MG tablet Commonly known as: PRAVACHOL Take 10 mg by mouth daily.   predniSONE 1 MG tablet Commonly known as: DELTASONE Take 3 mg by mouth daily.   albuterol (2.5 MG/3ML) 0.083% nebulizer solution Commonly known as: PROVENTIL Take 2.5 mg by nebulization  every 6 (six) hours as needed for wheezing.   ProAir HFA 108 (90 Base) MCG/ACT inhaler Generic drug: albuterol Inhale 2 puffs into the lungs every 6 (six) hours as needed for wheezing or shortness of breath.   REFRESH OP Place 1 drop 2 (two) times daily into both eyes.   sacubitril-valsartan 97-103 MG Commonly known as: ENTRESTO Take 1 tablet by mouth 2 (two) times daily.   sucralfate 1 GM/10ML suspension Commonly known as: CARAFATE Take 1 g by mouth 2 (two) times daily.   topiramate 25 MG tablet Commonly known as: TOPAMAX Take 75 mg by mouth at bedtime.   traMADol 50 MG tablet Commonly known as: ULTRAM TAKE 1 TO 2 TABLETS BY MOUTH EVERY 6 HOURS AS NEEDED FOR PAIN**PA REQ** What changed:   how much to take  how to take this  when to take this  reasons to take this  additional instructions   Vitamin D (Ergocalciferol) 1.25 MG (50000 UNIT) Caps capsule Commonly known as: DRISDOL Take 50,000 Units by mouth every 7 (seven) days.       Allergies  Allergen Reactions  . Metoclopramide Other (See Comments)    Developed restless leg, akathisia type limb movements.   . Codeine Itching, Rash and Other (See Comments)  . Penicillins Itching    Has patient had a PCN reaction causing immediate rash, facial/tongue/throat swelling, SOB or lightheadedness with hypotension: No Has patient had a PCN reaction causing severe rash involving mucus membranes or skin necrosis: No Has patient had a PCN reaction that required hospitalization: No Has patient had a PCN reaction occurring within the last 10 years:yes If all of the above answers are "NO", then may  proceed with Cephalosporin use.  Marland Kitchen Hydrocodone Rash  . Percocet [Oxycodone-Acetaminophen] Hives and Rash    Tolerates dilaudid   Consultations:  CIR   Procedures/Studies: DG Chest 2 View  Result Date: 08/12/2019 CLINICAL DATA:  49 year old female with history of shortness of breath. EXAM: CHEST - 2 VIEW COMPARISON:  Chest x-ray 03/26/2018. FINDINGS: Lung volumes are low. Bibasilar opacities which may reflect areas of atelectasis and/or consolidation. No definite pleural effusions. No evidence of pulmonary edema. Mild cardiomegaly. Upper mediastinal contours are within normal limits. IMPRESSION: 1. Low lung volumes with bibasilar opacities which may be areas of atelectasis and/or consolidation. Electronically Signed   By: Vinnie Langton M.D.   On: 08/12/2019 16:17   CT Angio Chest PE W and/or Wo Contrast  Result Date: 08/12/2019 CLINICAL DATA:  49 year old female with shortness of breath. Nausea vomiting. Epigastric pain and abnormal abdomen ultrasound earlier today suspicious for hepatic metastatic disease. History of lupus. EXAM: CT ANGIOGRAPHY CHEST CT ABDOMEN AND PELVIS WITH CONTRAST TECHNIQUE: Multidetector CT imaging of the chest was performed using the standard protocol during bolus administration of intravenous contrast. Multiplanar CT image reconstructions and MIPs were obtained to evaluate the vascular anatomy. Multidetector CT imaging of the abdomen and pelvis was performed using the standard protocol during bolus administration of intravenous contrast. CONTRAST:  160 milliliters OMNIPAQUE IOHEXOL 350 MG/ML SOLN, 56mL OMNIPAQUE IOHEXOL 350 MG/ML SOLN COMPARISON:  CTA chest 03/26/2018. CT Abdomen and Pelvis 01/05/2016. FINDINGS: CTA CHEST FINDINGS Cardiovascular: Late but adequate contrast bolus timing in the pulmonary arterial tree. Superimposed respiratory motion and low lung volumes. There is no central or hilar pulmonary artery filling defect. But the bilateral pulmonary artery  branches are not well evaluated despite repeated contrast bolus attempt. Negative thoracic aorta. Mild cardiomegaly is stable to improved since 2019. No pericardial  effusion. No calcified coronary artery atherosclerosis is evident. Mediastinum/Nodes: Negative.  No mediastinal lymphadenopathy. Lungs/Pleura: Low lung volumes with patent major airways aside from atelectatic changes. Nonspecific scattered and asymmetric bilateral pulmonary ground-glass opacity with confluent peribronchial opacity in the superior segment of the right lower lobe (series 4, image 53). Other bilateral lower lobe mildly confluent peribronchial opacity more resembles atelectasis. No pleural effusion. Musculoskeletal: No acute or suspicious osseous lesion identified in the chest. Review of the MIP images confirms the above findings. CT ABDOMEN and PELVIS FINDINGS Hepatobiliary: Multiple large at least 6-7 millimeter areas of abnormally decreased and heterogeneous liver enhancement in the right lobe are poorly marginated and masslike. The right lobe appears mildly expanded compared to 2017. The left lobe may be spared. The gallbladder is grossly normal. There is a small volume of intermediate density free fluid adjacent to the liver compatible with hemoperitoneum. Pancreas: Negative. Spleen: Diminutive and negative. Adrenals/Urinary Tract: Adrenal glands appear normal. No hydronephrosis. Bilateral renal enhancement is symmetric and within normal limits but on delayed images there is minimal contrast excretion from both kidneys. The proximal ureters are decompressed. Diminutive and unremarkable urinary bladder. There is a small 2 millimeter upper pole right renal calculus which is new from 2017. Stomach/Bowel: Largely decompressed and negative large bowel from the splenic flexure distally. There may be mild sigmoid diverticulosis. Redundant proximal transverse colon with mild retained stool. Similar mild retained stool in the right colon. No  large bowel inflammation is evident. Terminal ileum appears negative. Normal appendix suspected on coronal image 72. No dilated small bowel. Mildly fluid distended but otherwise negative stomach. No free air. Free fluid with intermediate density mostly around the liver and in both gutters, small volume overall. Vascular/Lymphatic: Arterial phase images of the abdomen and pelvis were included on the chest study. Abdominal aorta, its branches, iliac and proximal femoral arteries appear patent and within normal limits. On these images the portal venous system is not well evaluated. The main portal vein and SMV appear diminutive. Portal vein was also not well visualized on ultrasound earlier today. No lymphadenopathy. Reproductive: Stable and negative allowing for adjacent hemoperitoneum. Other: Intermediate density pelvic free fluid such as due to hemoperitoneum (series 9, image 82). Volume is moderate and both lower quadrants are also affected. Musculoskeletal: Lower lumbar facet degeneration. No destructive or suspicious osseous lesion identified. Review of the MIP images confirms the above findings. IMPRESSION: 1. Suboptimal CTA chest for PE with no central or hilar pulmonary embolus. If there is strong clinical suspicion a pulmonary V/Q scan would be complementary to evaluate the segmental and distal branches. 2. Highly abnormal liver with multifocal infiltrative mass-like areas of abnormal enhancement in the right lobe and associated small volume of Hemoperitoneum in the abdomen and pelvis. Top differential considerations include hemorrhagic Hepatocellular Carcinoma or Hepatic Adenomas. These were not evident on a 2017 CT Abdomen and Pelvis. 3. Pulmonary atelectasis and additional nonspecific ground-glass opacity. Acute pulmonary infection or aspiration difficult to exclude. No pleural effusion. 4. Little renal contrast excretion on the delayed images, but no obstructive uropathy. Consider ATN or other intrinsic  acute renal failure. 5. Cardiomegaly is stable or mildly improved since 2019. Study discussed by telephone with Dr. Dorie Rank on 08/12/2019 at 20:50 . Electronically Signed   By: Genevie Ann M.D.   On: 08/12/2019 20:56   CT ABDOMEN PELVIS W CONTRAST  Result Date: 08/12/2019 CLINICAL DATA:  49 year old female with shortness of breath. Nausea vomiting. Epigastric pain and abnormal abdomen ultrasound earlier today suspicious  for hepatic metastatic disease. History of lupus. EXAM: CT ANGIOGRAPHY CHEST CT ABDOMEN AND PELVIS WITH CONTRAST TECHNIQUE: Multidetector CT imaging of the chest was performed using the standard protocol during bolus administration of intravenous contrast. Multiplanar CT image reconstructions and MIPs were obtained to evaluate the vascular anatomy. Multidetector CT imaging of the abdomen and pelvis was performed using the standard protocol during bolus administration of intravenous contrast. CONTRAST:  160 milliliters OMNIPAQUE IOHEXOL 350 MG/ML SOLN, 24mL OMNIPAQUE IOHEXOL 350 MG/ML SOLN COMPARISON:  CTA chest 03/26/2018. CT Abdomen and Pelvis 01/05/2016. FINDINGS: CTA CHEST FINDINGS Cardiovascular: Late but adequate contrast bolus timing in the pulmonary arterial tree. Superimposed respiratory motion and low lung volumes. There is no central or hilar pulmonary artery filling defect. But the bilateral pulmonary artery branches are not well evaluated despite repeated contrast bolus attempt. Negative thoracic aorta. Mild cardiomegaly is stable to improved since 2019. No pericardial effusion. No calcified coronary artery atherosclerosis is evident. Mediastinum/Nodes: Negative.  No mediastinal lymphadenopathy. Lungs/Pleura: Low lung volumes with patent major airways aside from atelectatic changes. Nonspecific scattered and asymmetric bilateral pulmonary ground-glass opacity with confluent peribronchial opacity in the superior segment of the right lower lobe (series 4, image 53). Other bilateral lower  lobe mildly confluent peribronchial opacity more resembles atelectasis. No pleural effusion. Musculoskeletal: No acute or suspicious osseous lesion identified in the chest. Review of the MIP images confirms the above findings. CT ABDOMEN and PELVIS FINDINGS Hepatobiliary: Multiple large at least 6-7 millimeter areas of abnormally decreased and heterogeneous liver enhancement in the right lobe are poorly marginated and masslike. The right lobe appears mildly expanded compared to 2017. The left lobe may be spared. The gallbladder is grossly normal. There is a small volume of intermediate density free fluid adjacent to the liver compatible with hemoperitoneum. Pancreas: Negative. Spleen: Diminutive and negative. Adrenals/Urinary Tract: Adrenal glands appear normal. No hydronephrosis. Bilateral renal enhancement is symmetric and within normal limits but on delayed images there is minimal contrast excretion from both kidneys. The proximal ureters are decompressed. Diminutive and unremarkable urinary bladder. There is a small 2 millimeter upper pole right renal calculus which is new from 2017. Stomach/Bowel: Largely decompressed and negative large bowel from the splenic flexure distally. There may be mild sigmoid diverticulosis. Redundant proximal transverse colon with mild retained stool. Similar mild retained stool in the right colon. No large bowel inflammation is evident. Terminal ileum appears negative. Normal appendix suspected on coronal image 72. No dilated small bowel. Mildly fluid distended but otherwise negative stomach. No free air. Free fluid with intermediate density mostly around the liver and in both gutters, small volume overall. Vascular/Lymphatic: Arterial phase images of the abdomen and pelvis were included on the chest study. Abdominal aorta, its branches, iliac and proximal femoral arteries appear patent and within normal limits. On these images the portal venous system is not well evaluated. The  main portal vein and SMV appear diminutive. Portal vein was also not well visualized on ultrasound earlier today. No lymphadenopathy. Reproductive: Stable and negative allowing for adjacent hemoperitoneum. Other: Intermediate density pelvic free fluid such as due to hemoperitoneum (series 9, image 82). Volume is moderate and both lower quadrants are also affected. Musculoskeletal: Lower lumbar facet degeneration. No destructive or suspicious osseous lesion identified. Review of the MIP images confirms the above findings. IMPRESSION: 1. Suboptimal CTA chest for PE with no central or hilar pulmonary embolus. If there is strong clinical suspicion a pulmonary V/Q scan would be complementary to evaluate the segmental and distal branches.  2. Highly abnormal liver with multifocal infiltrative mass-like areas of abnormal enhancement in the right lobe and associated small volume of Hemoperitoneum in the abdomen and pelvis. Top differential considerations include hemorrhagic Hepatocellular Carcinoma or Hepatic Adenomas. These were not evident on a 2017 CT Abdomen and Pelvis. 3. Pulmonary atelectasis and additional nonspecific ground-glass opacity. Acute pulmonary infection or aspiration difficult to exclude. No pleural effusion. 4. Little renal contrast excretion on the delayed images, but no obstructive uropathy. Consider ATN or other intrinsic acute renal failure. 5. Cardiomegaly is stable or mildly improved since 2019. Study discussed by telephone with Dr. Dorie Rank on 08/12/2019 at 20:50 . Electronically Signed   By: Genevie Ann M.D.   On: 08/12/2019 20:56   DG CHEST PORT 1 VIEW  Result Date: 08/14/2019 CLINICAL DATA:  Shortness of breath EXAM: PORTABLE CHEST 1 VIEW COMPARISON:  Yesterday FINDINGS: Cardiomegaly and vascular pedicle widening. Congested appearance of vessels which may have progressed. Right IJ line with tip at the SVC. No visible effusion or pneumothorax. IMPRESSION: Cardiomegaly with vascular congestion  that may have progressed from yesterday. Electronically Signed   By: Monte Fantasia M.D.   On: 08/14/2019 05:17   DG Chest Portable 1 View  Result Date: 08/13/2019 CLINICAL DATA:  Post central line placement. EXAM: PORTABLE CHEST 1 VIEW COMPARISON:  Chest radiograph 08/12/2019 FINDINGS: Interval placement of a right IJ approach central venous catheter with tip projecting in the region of the superior vena cava. Overlying cardiac monitoring leads. Unchanged mild cardiomegaly. Shallow inspiration radiograph. Persistent bibasilar opacities which may reflect atelectasis or pneumonia. No definite pleural effusion or evidence of pneumothorax. IMPRESSION: Interval placement of a right IJ approach central venous catheter with tip projecting in the region of the superior vena cava. Persistent low lung volumes and bibasilar opacities which may reflect atelectasis and/or pneumonia. Electronically Signed   By: Kellie Simmering DO   On: 08/13/2019 09:17   US Abdomen Limited RUQ  Result Date: 08/12/2019 CLINICAL DATA:  Epigastric pain. EXAM: ULTRASOUND ABDOMEN LIMITED RIGHT UPPER QUADRANT COMPARISON:  Abdominopelvic CT 01/05/2016. Abdominal ultrasound 02/28/2015. FINDINGS: Gallbladder: No gallstones or wall thickening visualized. No sonographic Murphy sign noted by sonographer. Common bile duct: Diameter: Not visualized.  No biliary dilatation identified. Liver: The liver is very heterogeneous in echotexture. There are probable ill-defined masses within the left lobe. A predominately hypoechoic mass measures approximately 10.2 x 9.1 x 8.5 cm. There is a more echogenic infiltrative mass in the left lobe measuring 6.9 x 5.6 x 8.7 cm. These lesions were not present previously. The portal vein is not well visualized. Other: Small amount of ascites. IMPRESSION: 1. New diffuse heterogeneity of the hepatic parenchyma with ill-defined masses in the left hepatic lobe, suspicious for malignancy. Further evaluation with abdominopelvic  CT with contrast recommended. 2. The portal vein and common bile duct are not well visualized. No biliary dilatation identified. 3. The gallbladder appears normal. Electronically Signed   By: Richardean Sale M.D.   On: 08/12/2019 16:17    Subjective: And examined at bedside and she is doing relatively well.  Denies chest pain, lightheadedness or dizziness.  Wearing 2 L supplemental oxygen.  States her abdomen pain is a little improved today.  No other concerns or complaints at this time.  Discharge Exam: Vitals:   08/29/19 0908 08/29/19 1226  BP:    Pulse:    Resp:    Temp:    SpO2: 95% 98%   Vitals:   08/28/19 1933 08/29/19 0412 08/29/19 0908  08/29/19 1226  BP:  140/69    Pulse:  79    Resp:  18    Temp:  98.5 F (36.9 C)    TempSrc:      SpO2: 100% 99% 95% 98%  Weight:  (!) 193.4 kg    Height:       General: Pt is alert, awake, not in acute distress Cardiovascular: RRR, S1/S2 +, no rubs, no gallops Respiratory: CTA bilaterally, no wheezing, no rhonchi Abdominal: Soft, tender to palpate, significantly distended secondary body habitus managed, bowel sounds + Extremities: Trace to 1+ edema, no cyanosis  The results of significant diagnostics from this hospitalization (including imaging, microbiology, ancillary and laboratory) are listed below for reference.    Microbiology: Recent Results (from the past 240 hour(s))  SARS CORONAVIRUS 2 (TAT 6-24 HRS) Nasopharyngeal Urine, Clean Catch     Status: None   Collection Time: 08/24/19  3:38 AM   Specimen: Urine, Clean Catch; Nasopharyngeal  Result Value Ref Range Status   SARS Coronavirus 2 NEGATIVE NEGATIVE Final    Comment: (NOTE) SARS-CoV-2 target nucleic acids are NOT DETECTED. The SARS-CoV-2 RNA is generally detectable in upper and lower respiratory specimens during the acute phase of infection. Negative results do not preclude SARS-CoV-2 infection, do not rule out co-infections with other pathogens, and should not be used  as the sole basis for treatment or other patient management decisions. Negative results must be combined with clinical observations, patient history, and epidemiological information. The expected result is Negative. Fact Sheet for Patients: SugarRoll.be Fact Sheet for Healthcare Providers: https://www.woods-mathews.com/ This test is not yet approved or cleared by the [place] FDA and  has been authorized for detection and/or diagnosis of SARS-CoV-2 by FDA under an Emergency Use Authorization (EUA). This EUA will remain  in effect (meaning this test can be used) for the duration of the COVID-19 declaration under Section 56 4(b)(1) of the Act, 21 U.S.C. section 360bbb-3(b)(1), unless the authorization is terminated or revoked sooner. Performed at [facility], [place] [address], [place], [place] [address]   Culture, Urine     Status: Abnormal   Collection Time: 08/24/19  4:21 AM   Specimen: Urine, Clean Catch  Result Value Ref Range Status   Specimen Description URINE, CLEAN CATCH  Final   Special Requests   Final    NONE Performed at [facility], [place] [address] [place], [place] [address]    Culture MULTIPLE SPECIES PRESENT, SUGGEST RECOLLECTION (A)  Final   Report Status 08/25/2019 FINAL  Final   Labs: BNP (last 3 results) Recent Labs    08/12/19 1506  BNP [id number]   Basic Metabolic Panel: Recent Labs  Lab 08/23/19 2244 08/24/19 0538 08/26/19 0426 08/27/19 0528 08/29/19 0957  NA 136 138 138 142 141  K 3.8 4.0 3.8 3.8 4.3  CL 100 102 101 106 107  CO2 27 29 25 28 26   GLUCOSE 186* 176* 195* 129* 129*  BUN 12 15 30* 29* 22*  CREATININE 1.04* 0.92 2.64* 1.50* 1.29*  CALCIUM 8.7* 8.9 8.9 8.9 9.0  MG  --  1.8  --   --  2.0  PHOS  --  3.0  --   --  4.0   Liver Function Tests: Recent Labs  Lab 08/23/19 2244 08/24/19 0538 08/26/19 0426 08/29/19 0957  AST 85* 64* 25 26  ALT 86* 75* 44 25  ALKPHOS 168* 165* 155*  147*  BILITOT 0.9 1.1 0.5 0.4  PROT 5.8*  5.8* 5.8* 6.0*  ALBUMIN 2.3* 2.3* 2.3* 2.4*   No results for input(s): LIPASE, AMYLASE in the last 168 hours. No results for input(s): AMMONIA in the last 168 hours. CBC: Recent Labs  Lab 08/23/19 2244 08/23/19 2244 08/24/19 0538 08/25/19 1154 08/25/19 1935 08/26/19 0426 08/29/19 0957  WBC 13.6*   < > 14.6* 11.6* 12.8* 11.1* 8.5  NEUTROABS 10.8*  --   --   --   --   --  5.0  HGB 8.9*   < > 8.7* 8.6* 8.5* 8.1* 8.3*  HCT 29.7*   < > 29.1* 28.7* 27.7* 26.4* 26.6*  MCV 95.2   < > 95.1 94.4 94.2 94.0 89.6  PLT 310   < > 323 317 308 290 343   < > = values in this interval not displayed.   Cardiac Enzymes: No results for input(s): CKTOTAL, CKMB, CKMBINDEX, TROPONINI in the last 168 hours. BNP: Invalid input(s): POCBNP CBG: Recent Labs  Lab 08/29/19 0012 08/29/19 0407 08/29/19 0556 08/29/19 0758 08/29/19 1112  GLUCAP 154* 132* 107* 94 144*   D-Dimer No results for input(s): DDIMER in the last 72 hours. Hgb A1c No results for input(s): HGBA1C in the last 72 hours. Lipid Profile No results for input(s): CHOL, HDL, LDLCALC, TRIG, CHOLHDL, LDLDIRECT in the last 72 hours. Thyroid function studies No results for input(s): TSH, T4TOTAL, T3FREE, THYROIDAB in the last 72 hours.  Invalid input(s): FREET3 Anemia work up No results for input(s): VITAMINB12, FOLATE, FERRITIN, TIBC, IRON, RETICCTPCT in the last 72 hours. Urinalysis    Component Value Date/Time   COLORURINE AMBER (A) 08/24/2019 0427   APPEARANCEUR CLEAR 08/24/2019 0427   LABSPEC 1.027 08/24/2019 0427   PHURINE 6.0 08/24/2019 0427   GLUCOSEU >=500 (A) 08/24/2019 0427   HGBUR LARGE (A) 08/24/2019 0427   BILIRUBINUR NEGATIVE 08/24/2019 0427   KETONESUR 5 (A) 08/24/2019 0427   PROTEINUR 100 (A) 08/24/2019 0427   UROBILINOGEN 0.2 02/27/2015 1733   NITRITE NEGATIVE 08/24/2019 0427   LEUKOCYTESUR MODERATE (A) 08/24/2019 0427   Sepsis Labs Invalid input(s): PROCALCITONIN,   WBC,  LACTICIDVEN Microbiology Recent Results (from the past 240 hour(s))  SARS CORONAVIRUS 2 (TAT 6-24 HRS) Nasopharyngeal Urine, Clean Catch     Status: None   Collection Time: 08/24/19  3:38 AM   Specimen: Urine, Clean Catch; Nasopharyngeal  Result Value Ref Range Status   SARS Coronavirus 2 NEGATIVE NEGATIVE Final    Comment: (NOTE) SARS-CoV-2 target nucleic acids are NOT DETECTED. The SARS-CoV-2 RNA is generally detectable in upper and lower respiratory specimens during the acute phase of infection. Negative results do not preclude SARS-CoV-2 infection, do not rule out co-infections with other pathogens, and should not be used as the sole basis for treatment or other patient management decisions. Negative results must be combined with clinical observations, patient history, and epidemiological information. The expected result is Negative. Fact Sheet for Patients: SugarRoll.be Fact Sheet for Healthcare Providers: https://www.woods-mathews.com/ This test is not yet approved or cleared by the [place] FDA and  has been authorized for detection and/or diagnosis of SARS-CoV-2 by FDA under an Emergency Use Authorization (EUA). This EUA will remain  in effect (meaning this test can be used) for the duration of the COVID-19 declaration under Section 56 4(b)(1) of the Act, 21 U.S.C. section 360bbb-3(b)(1), unless the authorization is terminated or revoked sooner. Performed at [facility], [place] [address], [place], [place] [address]   Culture, Urine     Status: Abnormal  Collection Time: 08/24/19  4:21 AM   Specimen: Urine, Clean Catch  Result Value Ref Range Status   Specimen Description URINE, CLEAN CATCH  Final   Special Requests   Final    NONE Performed at [facility], [address], [place],  [address]    Culture MULTIPLE SPECIES PRESENT, SUGGEST RECOLLECTION (A)  Final   Report Status 08/25/2019 FINAL   Final   Time coordinating discharge: 35 minutes  SIGNED:  Kerney Elbe, DO [facility] 08/29/2019, 12:38 PM Pager is on [place]  If 7PM-7AM, please contact night-coverage [email or web address] Password [email or web address]

## 2019-08-30 ENCOUNTER — Inpatient Hospital Stay (HOSPITAL_COMMUNITY): Payer: Medicaid Other

## 2019-08-30 ENCOUNTER — Inpatient Hospital Stay (HOSPITAL_COMMUNITY): Payer: Medicaid Other | Admitting: Occupational Therapy

## 2019-08-30 ENCOUNTER — Inpatient Hospital Stay (HOSPITAL_COMMUNITY): Payer: Medicaid Other | Admitting: Physical Therapy

## 2019-08-30 DIAGNOSIS — R5381 Other malaise: Principal | ICD-10-CM

## 2019-08-30 DIAGNOSIS — I5043 Acute on chronic combined systolic (congestive) and diastolic (congestive) heart failure: Secondary | ICD-10-CM

## 2019-08-30 DIAGNOSIS — L8990 Pressure ulcer of unspecified site, unspecified stage: Secondary | ICD-10-CM | POA: Diagnosis present

## 2019-08-30 LAB — CBC WITH DIFFERENTIAL/PLATELET
Abs Immature Granulocytes: 0.03 10*3/uL (ref 0.00–0.07)
Basophils Absolute: 0.1 10*3/uL (ref 0.0–0.1)
Basophils Relative: 1 %
Eosinophils Absolute: 0.6 10*3/uL — ABNORMAL HIGH (ref 0.0–0.5)
Eosinophils Relative: 9 %
HCT: 24.7 % — ABNORMAL LOW (ref 36.0–46.0)
Hemoglobin: 8.1 g/dL — ABNORMAL LOW (ref 12.0–15.0)
Immature Granulocytes: 0 %
Lymphocytes Relative: 29 %
Lymphs Abs: 2.1 10*3/uL (ref 0.7–4.0)
MCH: 29.2 pg (ref 26.0–34.0)
MCHC: 32.8 g/dL (ref 30.0–36.0)
MCV: 89.2 fL (ref 80.0–100.0)
Monocytes Absolute: 0.7 10*3/uL (ref 0.1–1.0)
Monocytes Relative: 10 %
Neutro Abs: 3.7 10*3/uL (ref 1.7–7.7)
Neutrophils Relative %: 51 %
Platelets: 320 10*3/uL (ref 150–400)
RBC: 2.77 MIL/uL — ABNORMAL LOW (ref 3.87–5.11)
RDW: 14.8 % (ref 11.5–15.5)
WBC: 7.3 10*3/uL (ref 4.0–10.5)
nRBC: 0 % (ref 0.0–0.2)

## 2019-08-30 LAB — COMPREHENSIVE METABOLIC PANEL
ALT: 23 U/L (ref 0–44)
AST: 21 U/L (ref 15–41)
Albumin: 2.2 g/dL — ABNORMAL LOW (ref 3.5–5.0)
Alkaline Phosphatase: 144 U/L — ABNORMAL HIGH (ref 38–126)
Anion gap: 8 (ref 5–15)
BUN: 20 mg/dL (ref 6–20)
CO2: 25 mmol/L (ref 22–32)
Calcium: 8.9 mg/dL (ref 8.9–10.3)
Chloride: 107 mmol/L (ref 98–111)
Creatinine, Ser: 1.09 mg/dL — ABNORMAL HIGH (ref 0.44–1.00)
GFR calc Af Amer: >60 mL/min (ref >60)
GFR calc non Af Amer: 60 mL/min — ABNORMAL LOW (ref >60)
Glucose, Bld: 118 mg/dL — ABNORMAL HIGH (ref 70–99)
Potassium: 4.3 mmol/L (ref 3.5–5.1)
Sodium: 140 mmol/L (ref 135–145)
Total Bilirubin: 0.8 mg/dL (ref 0.3–1.2)
Total Protein: 5.8 g/dL — ABNORMAL LOW (ref 6.5–8.1)

## 2019-08-30 LAB — GLUCOSE, CAPILLARY
Glucose-Capillary: 104 mg/dL — ABNORMAL HIGH (ref 70–99)
Glucose-Capillary: 144 mg/dL — ABNORMAL HIGH (ref 70–99)

## 2019-08-30 MED ORDER — DICLOFENAC SODIUM 1 % EX GEL
2.0000 g | Freq: Four times a day (QID) | CUTANEOUS | Status: DC
  Administered 2019-08-30 – 2019-09-18 (×51): 2 g via TOPICAL
  Filled 2019-08-30 (×2): qty 100

## 2019-08-30 MED ORDER — MEGESTROL ACETATE 40 MG PO TABS
40.0000 mg | ORAL_TABLET | Freq: Every day | ORAL | Status: DC
  Administered 2019-08-30 – 2019-09-18 (×20): 40 mg via ORAL
  Filled 2019-08-30 (×20): qty 1

## 2019-08-30 MED ORDER — NAPHAZOLINE-GLYCERIN 0.012-0.2 % OP SOLN
1.0000 [drp] | Freq: Four times a day (QID) | OPHTHALMIC | Status: DC | PRN
  Filled 2019-08-30: qty 15

## 2019-08-30 MED ORDER — HYDROCERIN EX CREA
TOPICAL_CREAM | Freq: Two times a day (BID) | CUTANEOUS | Status: DC
  Filled 2019-08-30 (×2): qty 113

## 2019-08-30 MED ORDER — ALBUTEROL SULFATE (2.5 MG/3ML) 0.083% IN NEBU: 2.5000 mg | INHALATION_SOLUTION | RESPIRATORY_TRACT | Status: DC | PRN

## 2019-08-30 NOTE — Progress Notes (Signed)
PHYSICAL MEDICINE & REHABILITATION PROGRESS NOTE   Subjective/Complaints:  Pt reports no issues- denies current pain; only issue I noted is her skin appears dry.    ROS; pt denies SOB, CP, endorses abd pain; denies N/V/D/C; denies vision changes  Objective:   No results found. Recent Labs    08/29/19 0957 08/30/19 0609  WBC 8.5 7.3  HGB 8.3* 8.1*  HCT 26.6* 24.7*  PLT 343 320   Recent Labs    08/29/19 0957 08/30/19 0609  NA 141 140  K 4.3 4.3  CL 107 107  CO2 26 25  GLUCOSE 129* 118*  BUN 22* 20  CREATININE 1.29* 1.09*  CALCIUM 9.0 8.9    Intake/Output Summary (Last 24 hours) at 08/30/2019 1610 Last data filed at 08/30/2019 0730 Gross per 24 hour  Intake 240 ml  Output --  Net 240 ml     Physical Exam: Vital Signs Blood pressure (!) 106/54, pulse 86, temperature 98.7 F (37.1 C), temperature source Oral, resp. rate 18, weight (!) 195.4 kg, last menstrual period 03/24/2017, SpO2 99 %.  Morbidly obese female with BMI of 64+ sitting up in bed;  NAD  HENT:  Head: Normocephalic and atraumatic.  Nose: Nose normal.  Mouth/Throat: Oropharynx is clear and moist. No oropharyngeal exudate.  Eyes:  Conjunctivae and EOM are normal.  Neck: No tracheal deviation present.  Cardiovascular: Normal rate and regular rhythm.  Respiratory: Effort normal. No stridor. No respiratory distress. She has wheezes (occasional upper airway sounds). She has no rales.  GI: Soft. Bowel sounds are normal. There is no rebound and no guarding.  Soft, protuberant, mildly TTP diffusely; ND, (+) hypoactive BS  Musculoskeletal:        General: No tenderness or edema.     Cervical back: Normal range of motion and neck supple.     Comments: B/L UEs- deltoi 4-/5, biceps 4/5, triceps 4/5, WE 4/5, grip 4/5, finger abd 4-/5 LEs- HF 2+/5, KE 2+/5, DF/PF 4-/5 Very large LEs- hard to move for examiner or pt.   Neurological: She is alert and oriented to person, place, and time. No cranial  nerve deficit. Coordination normal.  Sensation intact in all 4 extremities to light touch Skin: Skin is warm and extremely dry, esp on arms Right breast intertriginous area with large area of denuded tissue with white eschar. Area under left breast moist and macerated appearing--tender to touch.  1 IV in L forearm- no signs of infiltration  Psychiatric:  Slow to process/respond Flat affect     Assessment/Plan: 1. Functional deficits secondary to debility due to liver bleeding adenoma and DKA/AKI which require 3+ hours per day of interdisciplinary therapy in a comprehensive inpatient rehab setting.  Physiatrist is providing close team supervision and 24 hour management of active medical problems listed below.  Physiatrist and rehab team continue to assess barriers to discharge/monitor patient progress toward functional and medical goals  Care Tool:  Bathing    Body parts bathed by patient: Right arm, Left arm, Chest, Abdomen, Front perineal area, Face   Body parts bathed by helper: Right arm, Left arm, Front perineal area, Buttocks, Right upper leg, Left upper leg, Right lower leg, Left lower leg     Bathing assist Assist Level: Maximal Assistance - Patient 24 - 49%     Upper Body Dressing/Undressing Upper body dressing   What is the patient wearing?: Pull over shirt    Upper body assist Assist Level: Moderate Assistance - Patient 50 - 74%  Lower Body Dressing/Undressing Lower body dressing      What is the patient wearing?: Underwear/pull up, Pants     Lower body assist Assist for lower body dressing: Total Assistance - Patient < 25%     Toileting Toileting    Toileting assist Assist for toileting: 2 Helpers     Transfers Chair/bed transfer  Transfers assist     Chair/bed transfer assist level: 2 Helpers     Locomotion Ambulation   Ambulation assist   Ambulation activity did not occur: Safety/medical concerns(bilateral LE weakness, fatigue,  SOB)          Walk 10 feet activity   Assist  Walk 10 feet activity did not occur: Safety/medical concerns(bilateral LE weakness, fatigue, SOB)        Walk 50 feet activity   Assist Walk 50 feet with 2 turns activity did not occur: Safety/medical concerns(bilateral LE weakness, fatigue, SOB)         Walk 150 feet activity   Assist Walk 150 feet activity did not occur: Safety/medical concerns(bilateral LE weakness, fatigue, SOB)         Walk 10 feet on uneven surface  activity   Assist Walk 10 feet on uneven surfaces activity did not occur: Safety/medical concerns(bilateral LE weakness, fatigue, SOB)         Wheelchair     Assist Will patient use wheelchair at discharge?: Yes Type of Wheelchair: Manual Wheelchair activity did not occur: Safety/medical concerns(fatigue, SOB, decreased endurance)         Wheelchair 50 feet with 2 turns activity    Assist    Wheelchair 50 feet with 2 turns activity did not occur: Safety/medical concerns(fatigue, SOB, decreased endurance)       Wheelchair 150 feet activity     Assist  Wheelchair 150 feet activity did not occur: Safety/medical concerns(fatigue, SOB, decreased endurance)       Blood pressure (!) 106/54, pulse 86, temperature 98.7 F (37.1 C), temperature source Oral, resp. rate 18, weight (!) 195.4 kg, last menstrual period 03/24/2017, SpO2 99 %.  1.  Impaired mobility, ADLs and function secondary to debility due to hemrrhagic shock due to bleeding adenoma, AKI, and DKA             -patient may  shower             -ELOS/Goals: 7-10 days/ supervision to min assist 2.  Antithrombotics: -DVT/anticoagulation:  Mechanical: Sequential compression devices, below knee Bilateral lower extremities  -would benefit from Lovenox- will see if possible since her size and immobility             -antiplatelet therapy: N/A 3. Pain Management: Need to educated/encourage patient to work thorough pain as >  7 days post embolization. Continue MS contin with MSIR   4. Mood: LCSW to follow for evaluation and support.              -antipsychotic agents: N/A 5. Neuropsych: This patient is capable of making decisions on her own behalf. 6. Skin/Wound Care:  Antimicrobial fabric to absorb moisture to assist in management of MASD.  7. Fluids/Electrolytes/Nutrition: Continue Ensure max. Will add prostat for depleted protein stores.  8. Liver adenomas with bleeding s/p embolization: Continue to monitor H/H. 9. OSA/OHS: Continue 2L oxygen per --encourage CPAP at nights. Continue Dulera bid 10. T2DM: Hgb A1c- 8.6--> improving/sees Dr. Loanne Drilling. Continue to monitor BS ac/hs and titrate lantus as indicated--intake poor at this time. Continue Lantus with SSI  CBG (last 3)  Recent Labs    08/29/19 1627 08/29/19 2137 08/30/19 0614  GLUCAP 143* 122* 104*    1/29- well controlled BGs 11. Acute on chronic renal failure?: SCR improved from 3.10-->1.29  1/29- Cr 1.09 and BUN 20 12. Chronic combined systolic CHF: Check weights daily. Monitor for signs of overload. On Entresto, pravastatin, Bidil and coreg.    Filed Weights   08/30/19 0545  Weight: (!) 195.4 kg    1/29- will monitor weights for trend, daily 13. H/o Lupus: On Plaquenil bid with prednisone. On Lyrica for pain management.   14. H/o anxiety/depression: On Wellbutrin and cymbalta with Xanax prn. - getting over recent death of brother.  15. Hematuria?/Vaginal bleeding?: Nuvaring out PTA. Reports heavy bleeding off megestrol--?resume.         LOS: 1 days A FACE TO FACE EVALUATION WAS PERFORMED  [name] 08/30/2019, 4:10 PM

## 2019-08-30 NOTE — Progress Notes (Signed)
Physical Therapy Session Note  Patient Details  Name: [name] MRN: [id number] Date of Birth: 05/04/1971  Today's Date: 08/30/2019 PT Individual Time: 1132-1200 PT Individual Time Calculation (min): 28 min and 30'  Short Term Goals: Week 1:     Skilled Therapeutic Interventions/Progress Updates:   First session (28'):  Pt presents supine in bed and required encouragement to participate in therapy.  Pt required mod A for sup to sit transfer and scoot to EOB.  Pt performed multiple sit to stand transfers w/ mod A and verbal and visual cues for hand placement.  Pt stepped x 4-5 steps bed > recliner w/ mod to min A.  Pt required verbal cues for posture and foot clearance.  Pt requires seated rest breaks between trials d/t fatigue.  O2 sats remained >93% on 2 LPM w/ HR 95-6 bpm w/ activity.  Pt has no c/o pain w/ activity.  Second Session (30'):  Pt presents supine and required encouragement to participate w/ therapy.  Pt transferred sup to sit w/ mod A and verbal cues for use of rail.  Pt scooted to EOB and sat to perform LE there ex.  Pt states no pain w/ activity.  Pt performed calf raises, LAQ, abd/add 3 x 10 w/ rest breaks d/t fatigue, although O2 sats remained >93% on 2 LPM.  Pt transferred sit to stand w/ mod A and elevated bed.  Pt side-stepped to [facility] w/ RW and Min A.  Mod A required for LEs sit to supine transfer.  All needs within reach and bed alarm on.     Therapy Documentation Precautions:  Restrictions Weight Bearing Restrictions: No General:   Vital Signs:   Pain: noc/o pain either session.      Therapy/Group: Individual Therapy  Ladoris Gene 08/30/2019, 12:07 PM

## 2019-08-30 NOTE — Progress Notes (Signed)
Social Work Assessment and Plan   Patient Details  Name: [name] MRN: [id number] Date of Birth: 08-12-1970  Today's Date: 08/30/2019  Problem List:  Patient Active Problem List   Diagnosis Date Noted  . Pressure injury of skin 08/30/2019  . Physical debility 08/29/2019  . Obesity hypoventilation syndrome ([place]) 08/23/2019  . Skin breakdown 08/23/2019  . Essential hypertension 08/23/2019  . Liver hemorrhage 08/23/2019  . Acute blood loss anemia 08/13/2019  . Hepatic adenoma   . Hemoperitoneum   . Diabetic acidosis without coma ([place])   . Multifocal pneumonia 09/23/2017  . Acute respiratory failure ([place]) 09/21/2017  . CHF exacerbation ([place]) 09/20/2017  . Costochondritis 06/06/2017  . Chest pain 06/04/2017  . Diabetes ([place]) 09/19/2015  . Radiculopathy of cervical spine   . Severe headache 03/05/2014  . Family history of malignant neoplasm of breast   . Family history of malignant neoplasm of ovary   . Intractable nausea and vomiting 11/11/2013  . Septate uterus 09/08/2013  . Diabetic gastroparesis ([place]) 04/28/2013  . Constipation due to pain medication 04/21/2013  . Odynophagia 04/04/2013  . Liver masses 02/28/2013  . Reflux esophagitis 02/26/2013  . Morbid obesity ([place]) 10/10/2012  . Chronic diastolic CHF (congestive heart failure) ([place]) 10/09/2012  . Chronic respiratory failure with hypoxia ([place]) 10/09/2012  . Clinical polymyositis syndrome ([place]) 09/06/2012  . Mononeuritis of lower limb 01/12/2012  . HTN (hypertension) 11/08/2011  . Systolic CHF, acute on chronic ([place]) 11/08/2011  . HLD (hyperlipidemia) 07/19/2011  . Bell's palsy 08/09/2010  . SLE 08/09/2010  . [place] SYNDROME 08/09/2010   Past Medical History:  Past Medical History:  Diagnosis Date  . Acute renal failure (HCC)     Intractable nausea vomiting secondary to diabetic gastroparesis causing dehydration and acute renal failure /notes 04/01/2013  . Anginal pain ([place])   . Anxiety   . Bell's palsy  08/09/2010  . CAP (community acquired pneumonia) 10/2011   Archie Endo 11/08/2011  . Chest pain 01/13/2014  . CHF (congestive heart failure) ([place])   . Diabetic gastroparesis associated with type 2 diabetes mellitus ([place])    this is presumed diagnoses, not confirmed by any studies.   . Eczema   . Family history of malignant neoplasm of breast   . Family history of malignant neoplasm of ovary   . GERD (gastroesophageal reflux disease)   . High cholesterol   . Hypertension   . Liver lesion 08/13/2019  . Liver mass 2014   biopsied 03/2013 at [facility], not malignant.  is to undergo radiologic ablation of the mass in sept/October 2014.   . Lupus ([place])   . Migraines    "maybe a couple times/yr" (04/01/2013)  . Obesity   . Obstructive sleep apnea on CPAP 2011  . On home oxygen therapy    "2L 24/7" (02/28/2015)  . SJOGREN'S SYNDROME 08/09/2010  . SLE (systemic lupus erythematosus) ([place])    Archie Endo 12/01/2010  . Stroke [facility]) 2010; 10/2012   "left side is still weak from it, never fully regained full strength; no additions from stroke 10/2012"   Past Surgical History:  Past Surgical History:  Procedure Laterality Date  . BREAST CYST EXCISION Left 08/2005   epidermoid  . CARDIAC CATHETERIZATION  02/07/12  . ECTOPIC PREGNANCY SURGERY  1999  . ECTOPIC PREGNANCY SURGERY  1999  . ESOPHAGOGASTRODUODENOSCOPY N/A 02/26/2013   Procedure: ESOPHAGOGASTRODUODENOSCOPY (EGD);  Surgeon: Irene Shipper, MD;  Location: [facility] ENDOSCOPY;  Service: Endoscopy;  Laterality: N/A;  . ESOPHAGOGASTRODUODENOSCOPY N/A 03/02/2015  Procedure: ESOPHAGOGASTRODUODENOSCOPY (EGD);  Surgeon: Milus Banister, MD;  Location: [place];  Service: Endoscopy;  Laterality: N/A;  . HERNIA REPAIR    . LEFT AND RIGHT HEART CATHETERIZATION WITH CORONARY ANGIOGRAM N/A 02/07/2012   Procedure: LEFT AND RIGHT HEART CATHETERIZATION WITH CORONARY ANGIOGRAM;  Surgeon: Laverda Page, MD;  Location: [facility] CATH LAB;  Service: Cardiovascular;  Laterality: N/A;   . LIVER BIOPSY  03/2013   liver mass/medical hx noted above  . MUSCLE BIOPSY     for lupus/notes 12/01/2010  . UMBILICAL HERNIA REPAIR  1980's   Social History:  reports that she quit smoking about 28 years ago. Her smoking use included cigarettes. She has a 1.50 pack-year smoking history. She has never used smokeless tobacco. She reports that she does not drink alcohol or use drugs.  Family / Support Systems Marital Status: Single Spouse/Significant Other: N/A Children: 1 adult son that lives in [place]. Pt unable to provide location. Other Supports: Pt reports she will have support from her sister Darin Engels (roommate), niece Merlyn Lot and cousin Lelan Pons. Anticipated Caregiver: Pt reports her primary caregiver will be her sister Tawnya Crook and Thereasa Solo (when Thereasa Solo is not working; his work hours are 5am-2:30pm). Ability/Limitations of Caregiver: None reported Caregiver Availability: 24/7 Family Dynamics: Pt has a roommate [name] who is a friend of the family that helps provide assistance when he is not working. Pt states her sister Tawnya Crook assists with majority of her care needs. Pt has 5 siblings but they live in [place].  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 Pt states she was not able to work much due to medical condition (Lupus). 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 CNA certification. Read: Yes Employment Status: Disabled Public relations account executive Issues: None Guardian/Conservator: N/A   Abuse/Neglect Abuse/Neglect Assessment Can Be Completed: Yes Physical Abuse: Denies Verbal Abuse: Denies Sexual Abuse: Denies Exploitation of patient/patient's resources: Denies Self-Neglect: Denies  Emotional Status Pt's affect, behavior and adjustment status: Pt appears pleasant. Pt poor historian, with trouble staying awake during assessment. Pt admits  she has been adjusting to having Lupus and some days are better than others. Recent Psychosocial Issues: None reported Psychiatric History: Pt reports a psychotic break shortly after moving here from [place] in 2002. Pt states she had counseling and took medication during that time and has not had an incident since this episode. Substance Abuse History: Denies  Patient / Family Perceptions, Expectations & Goals Pt/Family understanding of illness & functional limitations: Pt has a general understanding of her medical condition, and able to identify supports to help if necessary. Premorbid pt/family roles/activities: Pt reports she was independent prior to admission; manages own bills/medications/transport self to/from medical appointmets, errands etc. Pt states her sister will assist with driving when she is "too overwhelmed." Anticipated changes in roles/activities/participation: Pt will require 24/7 care at discharge Pt/family expectations/goals: Pt expectations include building strength to atleast be close to  [employer]: None Premorbid Home Care/DME Agencies: None Transportation available at discharge: Family to transport home Resource referrals recommended: Neuropsychology  Discharge Planning Living Arrangements: Other relatives, Other (Comment)(Lenny (roommate)) Support Systems: Other relatives Type of Residence: Private residence Insurance Resources: Medicaid (specify county)([place] [employer]) Financial Resources: SSI Financial Screen Referred: No Living Expenses: Own Money Management: Patient Does the patient have any problems obtaining your medications?: No Home Management: Pt to continue to have support from family PRN. Patient/Family Preliminary Plans:  See above home management Social Work Anticipated Follow Up Needs: [place] Additional Notes/Comments: Plan to discharge to home.  Clinical Impression SW met with pt in room at bedside  to introduce self, explain role, and discuss discharge process. Pt confirms FACESHEET as accurate. Pt reports that she will d/c to home with support from her sister Tawnya Crook (majority) and other relatives/friends. Pt denies jail/prison hx. No HCPOA. Pt admits she quit smoking cigarettes 28 yrs ago when she found out she was pregnant. Pt denies EtOH and recreational drug use. DME reported: rollator walker, shower chair, bedside commode, and CPAP machine.    Loralee Pacas, MSW Office: [phone] Cell: [phone] 08/30/2019, 3:24 PM

## 2019-08-30 NOTE — Evaluation (Signed)
Occupational Therapy Assessment and Plan  Patient Details  Name: [name] MRN: [id number] Date of Birth: [birth year]  OT Diagnosis: muscle weakness (generalized) Rehab Potential: Rehab Potential (ACUTE ONLY): Good ELOS: 2 weeks   Today's Date: 08/30/2019 OT Individual Time:  -        Problem List:  Patient Active Problem List   Diagnosis Date Noted  . Pressure injury of skin 08/30/2019  . Physical debility 08/29/2019  . Obesity hypoventilation syndrome ([place]) 08/23/2019  . Skin breakdown 08/23/2019  . Essential hypertension 08/23/2019  . Liver hemorrhage 08/23/2019  . Acute blood loss anemia 08/13/2019  . Hepatic adenoma   . Hemoperitoneum   . Diabetic acidosis without coma ([place])   . Multifocal pneumonia 09/23/2017  . Acute respiratory failure ([place]) 09/21/2017  . CHF exacerbation ([place]) 09/20/2017  . Costochondritis 06/06/2017  . Chest pain 06/04/2017  . Diabetes ([place]) 09/19/2015  . Radiculopathy of cervical spine   . Severe headache 03/05/2014  . Family history of malignant neoplasm of breast   . Family history of malignant neoplasm of ovary   . Intractable nausea and vomiting 11/11/2013  . Septate uterus 09/08/2013  . Diabetic gastroparesis ([place]) 04/28/2013  . Constipation due to pain medication 04/21/2013  . Odynophagia 04/04/2013  . Liver masses 02/28/2013  . Reflux esophagitis 02/26/2013  . Morbid obesity ([place]) 10/10/2012  . Chronic diastolic CHF (congestive heart failure) ([place]) 10/09/2012  . Chronic respiratory failure with hypoxia ([place]) 10/09/2012  . Clinical polymyositis syndrome ([place]) 09/06/2012  . Mononeuritis of lower limb 01/12/2012  . HTN (hypertension) 11/08/2011  . Systolic CHF, acute on chronic ([place]) 11/08/2011  . HLD (hyperlipidemia) 07/19/2011  . Bell's palsy 08/09/2010  . SLE 08/09/2010  . [place] SYNDROME 08/09/2010    Past Medical History:  Past Medical History:  Diagnosis Date  . Acute renal failure (HCC)     Intractable nausea  vomiting secondary to diabetic gastroparesis causing dehydration and acute renal failure /notes 04/01/2013  . Anginal pain ([place])   . Anxiety   . Bell's palsy 08/09/2010  . CAP (community acquired pneumonia) 10/2011   Archie Endo 11/08/2011  . Chest pain 01/13/2014  . CHF (congestive heart failure) ([place])   . Diabetic gastroparesis associated with type 2 diabetes mellitus ([place])    this is presumed diagnoses, not confirmed by any studies.   . Eczema   . Family history of malignant neoplasm of breast   . Family history of malignant neoplasm of ovary   . GERD (gastroesophageal reflux disease)   . High cholesterol   . Hypertension   . Liver lesion 08/13/2019  . Liver mass 2014   biopsied 03/2013 at [facility], not malignant.  is to undergo radiologic ablation of the mass in sept/October 2014.   . Lupus ([place])   . Migraines    "maybe a couple times/yr" (04/01/2013)  . Obesity   . Obstructive sleep apnea on CPAP 2011  . On home oxygen therapy    "2L 24/7" (02/28/2015)  . SJOGREN'S SYNDROME 08/09/2010  . SLE (systemic lupus erythematosus) ([place])    Archie Endo 12/01/2010  . Stroke [facility]) 2010; 10/2012   "left side is still weak from it, never fully regained full strength; no additions from stroke 10/2012"   Past Surgical History:  Past Surgical History:  Procedure Laterality Date  . BREAST CYST EXCISION Left 08/2005   epidermoid  . CARDIAC CATHETERIZATION  02/07/12  . ECTOPIC PREGNANCY SURGERY  1999  . ECTOPIC PREGNANCY SURGERY  1999  . ESOPHAGOGASTRODUODENOSCOPY  N/A 02/26/2013   Procedure: ESOPHAGOGASTRODUODENOSCOPY (EGD);  Surgeon: Irene Shipper, MD;  Location: [facility] ENDOSCOPY;  Service: Endoscopy;  Laterality: N/A;  . ESOPHAGOGASTRODUODENOSCOPY N/A 03/02/2015   Procedure: ESOPHAGOGASTRODUODENOSCOPY (EGD);  Surgeon: Milus Banister, MD;  Location: [place];  Service: Endoscopy;  Laterality: N/A;  . HERNIA REPAIR    . LEFT AND RIGHT HEART CATHETERIZATION WITH CORONARY ANGIOGRAM N/A 02/07/2012   Procedure: LEFT  AND RIGHT HEART CATHETERIZATION WITH CORONARY ANGIOGRAM;  Surgeon: Laverda Page, MD;  Location: [facility] CATH LAB;  Service: Cardiovascular;  Laterality: N/A;  . LIVER BIOPSY  03/2013   liver mass/medical hx noted above  . MUSCLE BIOPSY     for lupus/notes 12/01/2010  . UMBILICAL HERNIA REPAIR  1980's    Assessment & Plan Clinical Impression: Patient is a 49 y.o. year old female with history of T2DM with gastroparesis, HTN, super morbid obesity, Sjogren's syndrome, Lupus, systolic CHF, OSA,hepatic  adenoma s/p embolization; who was originally admitted on 08/12/19 with 2 week history of N/V with severe abdominal pain. She was found to have AKI, metabolic acidosis with ketosis, elevated LFTs, AKI and anemia.  CT abdomen/pelvis/chest showed multiple mass-like lesions in the liver with hemoperitoneum. She was started on insulin drip for DKA, dilaudid PCA for pain control and sodium bicarb for AKI/metabolic acidosis.. Foley placed with increase in UOP and Dr. Carolin Sicks recommended monitoring for fluid overload with history of CHF.  Dr. Hilarie Fredrickson evaluated patient and recommended transfer to [facility] (Dr. Loletha Grayer) for evaluation and intervention.    She was transferred to [facility]-- MRI showed multiple nonenhancing large hepatic lesions with hemorrhage in the mass and she underwent embolization of right lobe adenomas on 01/20, placed on Lovenox for DVT prophylaxis and transferred back to [facility] on 01/22 for management.  Lovenox d/c due to vaginal bleeding but H/H stable. She continues to have issues with weakness, difficulty standing and is noted to be debilitated.  Patient transferred to CIR on 08/29/2019 .    Patient currently requires max with basic self-care skills and IADL secondary to muscle weakness, decreased cardiorespiratoy endurance and decreased oxygen support and decreased sitting balance, decreased standing balance and decreased postural control.  Prior to hospitalization, patient could complete adl with modified  independent .  Patient will benefit from skilled intervention to decrease level of assist with basic self-care skills, increase independence with basic self-care skills and increase level of independence with iADL prior to discharge home with care partner.  Anticipate patient will require 24 hour supervision and minimal physical assistance and follow up home health.  OT - End of Session Activity Tolerance: Tolerates 30+ min activity with multiple rests Endurance Deficit: Yes Endurance Deficit Description: fatigue with ADL tasks OT Assessment Rehab Potential (ACUTE ONLY): Good OT Barriers to Discharge: Home environment access/layout OT Barriers to Discharge Comments: steps to enter home OT Patient demonstrates impairments in the following area(s): Balance;Endurance;Skin Integrity;Motor OT Basic ADL's Functional Problem(s): Eating;Grooming;Bathing;Dressing;Toileting;Other (comment) OT Advanced ADL's Functional Problem(s): Simple Meal Preparation;Light Housekeeping OT Transfers Functional Problem(s): Toilet;Tub/Shower OT Plan OT Intensity: Minimum of 1-2 x/day, 45 to 90 minutes OT Frequency: 5 out of 7 days OT Duration/Estimated Length of Stay: 2 weeks OT Treatment/Interventions: Balance/vestibular training;Self Care/advanced ADL retraining;Therapeutic Exercise;Wheelchair propulsion/positioning;DME/adaptive equipment instruction;Skin care/wound managment;UE/LE Strength taining/ROM;Community reintegration;Patient/family education;Discharge planning;Functional mobility training;Therapeutic Activities OT Self Feeding Anticipated Outcome(s): independent OT Basic Self-Care Anticipated Outcome(s): supervision OT Toileting Anticipated Outcome(s): supervision OT Bathroom Transfers Anticipated Outcome(s): supervision OT Recommendation Patient destination: Home Follow Up Recommendations: Home health OT Equipment Details:  patient owns shower seat, commode   Skilled Therapeutic Intervention Patient  in bed, alert and pleasant.  2L O2 via [place] t/o session.   Reviewed role of OT, rehab schedule, safety with transfers/mobility, DME, plan of care and goals for therapy .  Evaluation completed as documented below.  Patient able to move from supine to sitting position with cues for technique, bed rails and moderate assistance.  She is able to tolerate unsupported sitting with CS for 45 minutes during which time she complete training and practice with bathing and dressing skills.  She requires mod/max A for UB bathing, mod A for UB dressing.  Max/dependent for LB bathing/dressing with a second person to assist with sit to stand for peri and buttocks hygiene and clothing management.  Sit to stand at bedside with RW with mod A of 2 - she is able to tolerate standing for approx 1 minute x2 and side step with moderate cues toward HOB.  Sitting to supine position with mod/max A of 2.  Patient demonstrates good awareness of needs, able to sit unsupported and move all 4 limbs functionally, she presents with poor endurance and overall strength limiting her ability to perform mobility and self care tasks.  She is a good rehab candidate to maximize independence and facilitate a safe return to home with family support.    OT Evaluation Precautions/Restrictions  Precautions Precautions: Fall Precaution Comments: 2L O2 via [place] Restrictions Weight Bearing Restrictions: No General   Vital Signs   Pain Pain Assessment Pain Scale: 0-10 Pain Score: 0-No pain Home Living/Prior Functioning Home Living Family/patient expects to be discharged to:: Private residence Living Arrangements: Non-relatives/Friends Available Help at Discharge: Family, Available 24 hours/day Type of Home: House Home Access: Stairs to enter [employer] of Steps: 8 - 4 to landing and additional 4 Entrance Stairs-Rails: Can reach both, Left, Right Home Layout: One level Bathroom Shower/Tub: Chiropodist:  Handicapped height Bathroom Accessibility: Yes Additional Comments: Sister is planning for someone to be with patient 24/7; family will be taking shifts caring for patient  Lives With: Friend(s) Prior Function Level of Independence: Requires assistive device for independence, Needs assistance with ADLs, Needs assistance with homemaking  Able to Take Stairs?: Yes Driving: Yes Vocation: On disability Leisure: Hobbies-yes (Comment)(playing with neices and nephews) Comments: Has aide that worked 80hours/month and helped with cleaning, cooking, bathing, dressing ADL ADL Eating: Supervision/safety, Set up Where Assessed-Eating: Bed level Grooming: Minimal assistance Where Assessed-Grooming: Edge of bed Upper Body Bathing: Moderate assistance Where Assessed-Upper Body Bathing: Edge of bed Lower Body Bathing: Dependent Where Assessed-Lower Body Bathing: Edge of bed Upper Body Dressing: Minimal assistance Where Assessed-Upper Body Dressing: Edge of bed Lower Body Dressing: Dependent Where Assessed-Lower Body Dressing: Edge of bed Toileting: Dependent Vision Baseline Vision/History: Wears glasses Wears Glasses: At all times Patient Visual Report: No change from baseline Vision Assessment?: No apparent visual deficits Perception  Perception: Within Functional Limits Praxis Praxis: Intact Cognition Overall Cognitive Status: Within Functional Limits for tasks assessed Arousal/Alertness: Awake/alert Orientation Level: Person;Place;Situation Person: Oriented Place: Oriented Situation: Oriented Year: 2021 Month: January Day of Week: Incorrect Memory: Appears intact(intact for hospital course and demographics) Immediate Memory Recall: Sock;Blue;Bed Memory Recall Sock: Not able to recall Memory Recall Blue: Without Cue Memory Recall Bed: Not able to recall Awareness: Appears intact Problem Solving: Appears intact Safety/Judgment: Appears intact Sensation Sensation Light Touch:  Appears Intact Proprioception: Appears Intact Additional Comments: LE sensation intact bilaterally Coordination Gross Motor Movements are Fluid and Coordinated:  No Fine Motor Movements are Fluid and Coordinated: Yes Finger Nose Finger Test: [facility] Heel Shin Test: decreased bilaterally Motor  Motor Motor: Abnormal postural alignment and control Motor - Skilled Clinical Observations: Generalized bilateral LE weakness Mobility  Bed Mobility Bed Mobility: Rolling Right;Rolling Left;Supine to Sit;Sit to Supine Rolling Right: Moderate Assistance - Patient 50-74% Rolling Left: Moderate Assistance - Patient 50-74% Supine to Sit: Maximal Assistance - Patient - Patient 25-49% Sit to Supine: 2 Helpers Transfers Sit to Stand: 2 Helpers  Trunk/Postural Assessment  Cervical Assessment Cervical Assessment: Within Functional Limits Thoracic Assessment Thoracic Assessment: Within Functional Limits Lumbar Assessment Lumbar Assessment: Exceptions to WFL(posterior pelvic tilt) Postural Control Postural Control: Deficits on evaluation  Balance Balance Balance Assessed: Yes Static Sitting Balance Static Sitting - Balance Support: Bilateral upper extremity supported Static Sitting - Level of Assistance: 5: Stand by assistance Dynamic Sitting Balance Dynamic Sitting - Balance Support: Bilateral upper extremity supported Dynamic Sitting - Level of Assistance: 5: Stand by assistance Static Standing Balance Static Standing - Balance Support: Bilateral upper extremity supported Static Standing - Level of Assistance: 1: +2 Total assist Extremity/Trunk Assessment RUE Assessment RUE Assessment: Within Functional Limits LUE Assessment LUE Assessment: Within Functional Limits     Refer to Care Plan for Long Term Goals  Recommendations for other services: None    Discharge Criteria: Patient will be discharged from OT if patient refuses treatment 3 consecutive times without medical reason, if  treatment goals not met, if there is a change in medical status, if patient makes no progress towards goals or if patient is discharged from hospital.  The above assessment, treatment plan, treatment alternatives and goals were discussed and mutually agreed upon: by patient  [name] 08/30/2019, 12:45 PM

## 2019-08-30 NOTE — Evaluation (Signed)
Physical Therapy Assessment and Plan  Patient Details  Name: [name] MRN: [id number] Date of Birth: 10-28-1970  PT Diagnosis: Abnormal posture, Abnormality of gait, Difficulty walking and Muscle weakness Rehab Potential: Good ELOS: 14-18 days   Today's Date: 08/30/2019 PT Individual Time: 3016-0109 PT Individual Time Calculation (min): 65 min    Problem List:  Patient Active Problem List   Diagnosis Date Noted   Physical debility 08/29/2019   Obesity hypoventilation syndrome ([place]) 08/23/2019   Skin breakdown 08/23/2019   Essential hypertension 08/23/2019   Liver hemorrhage 08/23/2019   Acute blood loss anemia 08/13/2019   Hepatic adenoma    Hemoperitoneum    Diabetic acidosis without coma ([place])    Multifocal pneumonia 09/23/2017   Acute respiratory failure ([place]) 09/21/2017   CHF exacerbation ([place]) 09/20/2017   Costochondritis 06/06/2017   Chest pain 06/04/2017   Diabetes ([place]) 09/19/2015   Radiculopathy of cervical spine    Severe headache 03/05/2014   Family history of malignant neoplasm of breast    Family history of malignant neoplasm of ovary    Intractable nausea and vomiting 11/11/2013   Septate uterus 09/08/2013   Diabetic gastroparesis ([place]) 04/28/2013   Constipation due to pain medication 04/21/2013   Odynophagia 04/04/2013   Liver masses 02/28/2013   Reflux esophagitis 02/26/2013   Morbid obesity ([place]) 10/10/2012   Chronic diastolic CHF (congestive heart failure) ([place]) 10/09/2012   Chronic respiratory failure with hypoxia ([place]) 10/09/2012   Clinical polymyositis syndrome ([place]) 09/06/2012   Mononeuritis of lower limb 01/12/2012   HTN (hypertension) 32/35/5732   Systolic CHF, acute on chronic ([place]) 11/08/2011   HLD (hyperlipidemia) 07/19/2011   Bell's palsy 08/09/2010   SLE 08/09/2010   SJOGREN'S SYNDROME 08/09/2010    Past Medical History:  Past Medical History:  Diagnosis Date   Acute renal failure (HCC)     Intractable nausea vomiting  secondary to diabetic gastroparesis causing dehydration and acute renal failure /notes 04/01/2013   Anginal pain ([place])    Anxiety    Bell's palsy 08/09/2010   CAP (community acquired pneumonia) 10/2011   [name] 11/08/2011   Chest pain 01/13/2014   CHF (congestive heart failure) ([place])    Diabetic gastroparesis associated with type 2 diabetes mellitus ([place])    this is presumed diagnoses, not confirmed by any studies.    Eczema    Family history of malignant neoplasm of breast    Family history of malignant neoplasm of ovary    GERD (gastroesophageal reflux disease)    High cholesterol    Hypertension    Liver lesion 08/13/2019   Liver mass 2014   biopsied 03/2013 at [facility], not malignant.  is to undergo radiologic ablation of the mass in sept/October 2014.    Lupus ([place])    Migraines    "maybe a couple times/yr" (04/01/2013)   Obesity    Obstructive sleep apnea on CPAP 2011   On home oxygen therapy    "2L 24/7" (02/28/2015)   SJOGREN'S SYNDROME 08/09/2010   SLE (systemic lupus erythematosus) ([place])    [name] 12/01/2010   Stroke ([place]) 2010; 10/2012   "left side is still weak from it, never fully regained full strength; no additions from stroke 10/2012"   Past Surgical History:  Past Surgical History:  Procedure Laterality Date   BREAST CYST EXCISION Left 08/2005   epidermoid   CARDIAC CATHETERIZATION  02/07/12   [place]   ESOPHAGOGASTRODUODENOSCOPY N/A 02/26/2013  Procedure: ESOPHAGOGASTRODUODENOSCOPY (EGD);  Surgeon: [name] Shipper, MD;  Location: [facility] ENDOSCOPY;  Service: Endoscopy;  Laterality: N/A;   ESOPHAGOGASTRODUODENOSCOPY N/A 03/02/2015   Procedure: ESOPHAGOGASTRODUODENOSCOPY (EGD);  Surgeon: [name] Banister, MD;  Location: [place];  Service: Endoscopy;  Laterality: N/A;   HERNIA REPAIR     LEFT AND RIGHT HEART CATHETERIZATION WITH CORONARY ANGIOGRAM N/A 02/07/2012   Procedure: LEFT AND RIGHT HEART CATHETERIZATION WITH  CORONARY ANGIOGRAM;  Surgeon: [name] Page, MD;  Location: [facility] CATH LAB;  Service: Cardiovascular;  Laterality: N/A;   LIVER BIOPSY  03/2013   liver mass/medical hx noted above   MUSCLE BIOPSY     for lupus/notes 08/01/9145   UMBILICAL HERNIA REPAIR  1980's    Assessment & Plan Clinical Impression: Patient is a 49 y.o. year old female with history of morbid obesity, Sjogren's syndrome, lupus, systolic heart failure, type 2 DM, gastroparesis, dyslipidemia, history of CVA, and obstructive sleep apnea. Pt initially presented to [facility] on 08/12/19 with a two day span of nausea, vomiting, and abdominal pain. Her workup revealed DKA and hemorrhagic shock due to bleeding hepatic adenoma and resulting AKI. On 08/18/19 pt was transferred to  [facility] for [facility] management where she underwent an embolization of the right lobe adenomas. Pt did have post op pain with need for PCA pump and was transferred back to [facility] of 08/23/19 for further management. Pt has weaned off pain pump and been evaluated by therapies with recommendation for CIR. Pt is to admit to CIR on 08/29/19.  Patient currently requires total with mobility secondary to muscle weakness and decreased standing balance, decreased postural control and decreased balance strategies. Prior to hospitalization, patient was min with mobility and lived with Friend(s)(roommate [name]) in a House home. Home access is 8Stairs to enter.  Patient will benefit from skilled PT intervention to maximize safe functional mobility, minimize fall risk and decrease caregiver burden for planned discharge home with 24 hour supervision.  Anticipate patient will benefit from follow up [place] at discharge.  PT - End of Session Activity Tolerance: Tolerates 30+ min activity with multiple rests Endurance Deficit: Yes Endurance Deficit Description: Fatigue, SOB with mobility PT Assessment Rehab Potential (ACUTE/IP ONLY): Good PT Barriers to Discharge: Home environment  access/layout;Weight PT Barriers to Discharge Comments: 8 STE PT Patient demonstrates impairments in the following area(s): Balance;Endurance;Motor;Pain PT Transfers Functional Problem(s): Bed Mobility;Bed to Chair;Car;Furniture PT Locomotion Functional Problem(s): Ambulation;Wheelchair Mobility;Stairs PT Plan PT Intensity: Minimum of 1-2 x/day ,45 to 90 minutes PT Frequency: 5 out of 7 days PT Duration Estimated Length of Stay: 14-18 days PT Treatment/Interventions: Ambulation/gait training;Discharge planning;Functional mobility training;Therapeutic Activities;Balance/vestibular training;Disease management/prevention;Neuromuscular re-education;Therapeutic Exercise;Wheelchair propulsion/positioning;DME/adaptive equipment instruction;Pain management;Splinting/orthotics;UE/LE Strength taining/ROM;Community reintegration;Functional electrical stimulation;Patient/family education;Stair training;UE/LE Coordination activities PT Transfers Anticipated Outcome(s): Supervision with LRAD PT Locomotion Anticipated Outcome(s): Supervision with LRAD PT Recommendation Follow Up Recommendations: Home health PT Patient destination: Home Equipment Recommended: To be determined Equipment Details: Has rollator and SPC at home  Skilled Therapeutic Intervention Evaluation completed (see details above and below) with education on PT POC and goals and individual treatment initiated with focus on functional mobility/transfers, LE strength, ambulation, stair navigation, simulated car transfers, dynamic standing balance/coordination, and improved endurance with activity. Received pt supine in bed, pt educated on PT evaluation and agreeable, pt reported pain 8/10 along abdomen and radiating across low back. Pt declined pain medication stating "this is my normal discomfort and I don't think pain medication will help right now". Pt on 2 L O2 via [place] throughout  session and O2 sat remaining >95%. Pt rolled to L and R with mod A  and use of bedrails. Pt transferred R sidelying<>sitting from flat bed +2 assist and use of bedrails initially. Pt able to maintain static sitting balance with CGA. O2 sat 95% sitting EOB on 2L O2. Pt transferred sit<>stand without AD +2 assist but only able to remain standing for approximately 30 seconds due to fatigue and bilateral LE weakness. Pt transferred sit<>stand x trial 2 with RW +2 assist. O2 sat 99% after activity. Pt transferred stand<>pivot bed<>WC with RW +2 assist with increased time. Pt required multiple rest breaks throughout session due to increased fatigue with activity. Pt reported discomfort in WC and declined remaining OOB for next therapy. Stand<>pivot WC<>bed with RW +2 assist. Sit<>supine +2 assist for LE management. Concluded session with pt supine in bed, needs within reach, and bed alarm on.   PT Evaluation Precautions/Restrictions Precautions Precautions: Fall Precaution Comments: 2L O2 via [place] Restrictions Weight Bearing Restrictions: No Home Living/Prior Functioning Home Living Available Help at Discharge: Family;Available 24 hours/day Type of Home: House Home Access: Stairs to enter [employer] of Steps: 8 Entrance Stairs-Rails: Can reach both Home Layout: One level Bathroom Accessibility: Yes Additional Comments: Sister is planning for someone to be with patient 24/7; family will be taking shifts caring for patient  Lives With: Friend(s)(roommate Lenny) Prior Function Level of Independence: Requires assistive device for independence;Needs assistance with ADLs  Able to Take Stairs?: Yes Driving: Yes Vocation: On disability Leisure: Hobbies-yes (Comment)(playing with neices and nephews) Comments: Has aide that worked 80hours/month and helped with cleaning, cooking, bathing, dressing Cognition Overall Cognitive Status: Within Functional Limits for tasks assessed Arousal/Alertness: Awake/alert Orientation Level: Oriented X4 Awareness: Appears  intact Problem Solving: Appears intact Safety/Judgment: Appears intact Sensation Sensation Light Touch: Appears Intact Proprioception: Appears Intact Additional Comments: LE sensation intact bilaterally Coordination Gross Motor Movements are Fluid and Coordinated: No Fine Motor Movements are Fluid and Coordinated: Yes Finger Nose Finger Test: WFL Heel Shin Test: decreased bilaterally Motor  Motor Motor: Abnormal postural alignment and control Motor - Skilled Clinical Observations: Generalized bilateral LE weakness Mobility Bed Mobility Bed Mobility: Rolling Right;Rolling Left;Supine to Sit;Sit to Supine Rolling Right: Moderate Assistance - Patient 50-74% Rolling Left: Moderate Assistance - Patient 50-74% Supine to Sit: 2 Helpers Sit to Supine: 2 Helpers Transfers Transfers: Sit to [employer] Sit to Stand: 2 Helpers Stand Pivot Transfers: 2 Helpers(RW) Transfer (Assistive device): Rolling walker Trunk/Postural Assessment  Cervical Assessment Cervical Assessment: Within Functional Limits Thoracic Assessment Thoracic Assessment: Within Functional Limits Lumbar Assessment Lumbar Assessment: Exceptions to WFL(posterior pelvic tilt) Postural Control Postural Control: Deficits on evaluation  Balance Balance Balance Assessed: Yes Static Sitting Balance Static Sitting - Balance Support: Bilateral upper extremity supported Static Sitting - Level of Assistance: 5: Stand by assistance(CGA) Dynamic Sitting Balance Dynamic Sitting - Balance Support: Bilateral upper extremity supported Dynamic Sitting - Level of Assistance: 5: Stand by assistance(CGA) Static Standing Balance Static Standing - Balance Support: Bilateral upper extremity supported Static Standing - Level of Assistance: 1: +2 Total assist Extremity Assessment  RLE Assessment RLE Assessment: Exceptions to [facility] General Strength Comments: grossly generalized to 4/5 LLE Assessment LLE Assessment:  Exceptions to [facility] General Strength Comments: grossly generalized to 4/5  Refer to Care Plan for Long Term Goals  Recommendations for other services: None   Discharge Criteria: Patient will be discharged from PT if patient refuses treatment 3 consecutive times without medical reason, if treatment goals not met,  if there is a change in medical status, if patient makes no progress towards goals or if patient is discharged from hospital.  The above assessment, treatment plan, treatment alternatives and goals were discussed and mutually agreed upon: by patient  Alfonse Alpers PT, DPT  08/30/2019, 7:31 AM

## 2019-08-30 NOTE — Progress Notes (Signed)
RT placed pt on CPAP dream station for the night. Pt settings max 20 min 4 w/3Lpm bled into the system. Pt respiratory status is stable at this time. RT will continue to monitor.

## 2019-08-30 NOTE — Progress Notes (Signed)
Inpatient Rehabilitation  Patient information reviewed and entered into eRehab system by Katniss Weedman M. Sohil Timko, M.A., CCC/SLP, PPS Coordinator.  Information including medical coding, functional ability and quality indicators will be reviewed and updated through discharge.    

## 2019-08-31 ENCOUNTER — Inpatient Hospital Stay (HOSPITAL_COMMUNITY): Payer: Medicaid Other | Admitting: Occupational Therapy

## 2019-08-31 ENCOUNTER — Inpatient Hospital Stay (HOSPITAL_COMMUNITY): Payer: Medicaid Other

## 2019-08-31 LAB — GLUCOSE, CAPILLARY
Glucose-Capillary: 135 mg/dL — ABNORMAL HIGH (ref 70–99)
Glucose-Capillary: 108 mg/dL — ABNORMAL HIGH (ref 70–99)
Glucose-Capillary: 127 mg/dL — ABNORMAL HIGH (ref 70–99)
Glucose-Capillary: 135 mg/dL — ABNORMAL HIGH (ref 70–99)
Glucose-Capillary: 156 mg/dL — ABNORMAL HIGH (ref 70–99)

## 2019-08-31 MED ORDER — MAGNESIUM CITRATE PO SOLN
1.0000 | Freq: Once | ORAL | Status: AC
  Administered 2019-08-31: 1 via ORAL
  Filled 2019-08-31: qty 296

## 2019-08-31 NOTE — Progress Notes (Signed)
Patient stated she had chest pain. This nurse and the charge nurse went in to assess her. B/P 119/52 HR 82, all in her baseline. On call Dr. Ranell Patrick was called. Scheduled morphine was given. Call bell at bedside.

## 2019-08-31 NOTE — Progress Notes (Signed)
Pt's last documented BM was 08/28/19. Pt has a one time order of magnesium citrate solution. Pt stated " I am worried I will not make it to the [facility] in time". Reassured pt that staff will be able to assist pt. Pt needs encouragement to continue to drink the solution. Will continue to encourage pt to finish solution. Amanda Cockayne, LPN

## 2019-08-31 NOTE — Plan of Care (Signed)
  Problem: Consults Goal: RH GENERAL PATIENT EDUCATION Description: See Patient Education module for education specifics. Outcome: Progressing Goal: Skin Care Protocol Initiated - if Braden Score 18 or less Description: If consults are not indicated, leave blank or document N/A Outcome: Progressing Goal: Diabetes Guidelines if Diabetic/Glucose > 140 Description: If diabetic or lab glucose is > 140 mg/dl - Initiate Diabetes/Hyperglycemia Guidelines & Document Interventions  Outcome: Progressing   Problem: RH BLADDER ELIMINATION Goal: RH STG MANAGE BLADDER WITH ASSISTANCE Description: STG Manage Bladder With min/mod Assistance Outcome: Progressing   Problem: RH SKIN INTEGRITY Goal: RH STG SKIN FREE OF INFECTION/BREAKDOWN Description: Pt will be free of skin breakdown prior to DC with min/mod assist Outcome: Progressing Goal: RH STG MAINTAIN SKIN INTEGRITY WITH ASSISTANCE Description: STG Maintain Skin Integrity With min/mod Assistance. Outcome: Progressing Goal: RH STG ABLE TO PERFORM INCISION/WOUND CARE W/ASSISTANCE Description: STG Able To Perform Incision/Wound Care With mod Assistance. Outcome: Progressing   Problem: RH SAFETY Goal: RH STG ADHERE TO SAFETY PRECAUTIONS W/ASSISTANCE/DEVICE Description: STG Adhere to Safety Precautions With cues/reminders Assistance/Device. Outcome: Progressing   Problem: RH PAIN MANAGEMENT Goal: RH STG PAIN MANAGED AT OR BELOW PT'S PAIN GOAL Description: Less than 4 on 0-10 scale Outcome: Progressing

## 2019-08-31 NOTE — Progress Notes (Signed)
Occupational Therapy Session Note  Patient Details  Name: [name] MRN: [id number] Date of Birth: Nov 27, 1970  Today's Date: 08/31/2019 OT Individual Time: 1000-1100 OT Individual Time Calculation (min): 60 min    Short Term Goals: Week 1:  OT Short Term Goal 1 (Week 1): Patient will complete bed mobility with mod A of one OT Short Term Goal 2 (Week 1): patient will complete sit to stand and SPT with RW min A of one OT Short Term Goal 3 (Week 1): patient will complete UB bathing/dressing with set up OT Short Term Goal 4 (Week 1): patient will complete LB bathing/dressing with mod A and demonstrate good carryover of AD use OT Short Term Goal 5 (Week 1): patient will tolerate standing for grooming tasks at sink for 5 minutes with CGA  Skilled Therapeutic Interventions/Progress Updates:    Patient in bed, asleep but easy to arouse and eager to participate in therapy session.  Supine to sitting with min/mod A of one and bed rails.  Sit to stand from bed with CG/min A of one and stand by of 2nd person, SPT with RW to recliner CGA x2.  Throughout session completed standing trails x4 for one minute each with CGA from recliner.   She completed theraband activities - 2 sets of 10 (scap ret, rows low and high, shoulder abd R/L) provided theraputty and instructed on use for activity when not in therapy.  Completed trunk abd/lat/rot activities with good tolerance.  Reviewed weight shift and repositioning options.  She remained sitting in recliner with legs elevated at end of session, seat alarm set and call bell in reach.    Therapy Documentation Precautions:  Precautions Precautions: Fall Precaution Comments: 2L O2 via [place] Restrictions Weight Bearing Restrictions: No General:   Vital Signs:   Pain: Pain Assessment Pain Scale: 0-10 Pain Score: 0-No pain Other Treatments:     Therapy/Group: Individual Therapy  Carlos Levering 08/31/2019, 12:19 PM

## 2019-08-31 NOTE — Progress Notes (Signed)
[place] PHYSICAL MEDICINE & REHABILITATION PROGRESS NOTE   Subjective/Complaints:  Patient complains of constipation. States her last BM is Monday. Concerned that if she receives additional Laxative she will not be able to make it to the bathroom on time. Denies SOB or chest pain or other sources of pain. States she is sleeping well at night.  ROS; pt denies SOB, CP, endorses abd pain; denies N/V/D/C; denies vision changes  Objective:   No results found. Recent Labs    08/29/19 0957 08/30/19 0609  WBC 8.5 7.3  HGB 8.3* 8.1*  HCT 26.6* 24.7*  PLT 343 320   Recent Labs    08/29/19 0957 08/30/19 0609  NA 141 140  K 4.3 4.3  CL 107 107  CO2 26 25  GLUCOSE 129* 118*  BUN 22* 20  CREATININE 1.29* 1.09*  CALCIUM 9.0 8.9    Intake/Output Summary (Last 24 hours) at 08/31/2019 1354 Last data filed at 08/31/2019 0700 Gross per 24 hour  Intake 720 ml  Output --  Net 720 ml     Physical Exam: Vital Signs Blood pressure 134/75, pulse 83, temperature 98.6 F (37 C), resp. rate 18, weight (!) 192.3 kg, last menstrual period 03/24/2017, SpO2 100 %.  Morbidly obese female with BMI of 64+ , standing with therapy.   HENT:  Head: Normocephalic and atraumatic.  Nose: Nose normal. [place] in place.  Mouth/Throat: Oropharynx is clear and moist. No oropharyngeal exudate.  Eyes:  Conjunctivae and EOM are normal.  Neck: No tracheal deviation present.  Cardiovascular: Normal rate and regular rhythm.  Respiratory: Effort normal. No stridor. No respiratory distress. She has wheezes (occasional upper airway sounds). She has no rales.  GI: Soft. Bowel sounds are normal. There is no rebound and no guarding.  Soft, protuberant, mildly TTP diffusely; ND, (+) hypoactive BS  Musculoskeletal:        General: No tenderness or edema.     Cervical back: Normal range of motion and neck supple.     Comments: B/L UEs- deltoi 4-/5, biceps 4/5, triceps 4/5, WE 4/5, grip 4/5, finger abd 4-/5 LEs- HF  2+/5, KE 2+/5, DF/PF 4-/5 Very large LEs- hard to move for examiner or pt.   Neurological: She is alert and oriented to person, place, and time. No cranial nerve deficit. Coordination normal.  Sensation intact in all 4 extremities to light touch Skin: Skin is warm and extremely dry, esp on arms Right breast intertriginous area with large area of denuded tissue with white eschar. Area under left breast moist and macerated appearing--tender to touch.  1 IV in L forearm- no signs of infiltration  Psychiatric:  Slow to process/respond Flat affect   Assessment/Plan: 1. Functional deficits secondary to debility due to liver bleeding adenoma and DKA/AKI which require 3+ hours per day of interdisciplinary therapy in a comprehensive inpatient rehab setting.  Physiatrist is providing close team supervision and 24 hour management of active medical problems listed below.  Physiatrist and rehab team continue to assess barriers to discharge/monitor patient progress toward functional and medical goals  Care Tool:  Bathing    Body parts bathed by patient: Right arm, Left arm, Chest, Abdomen, Front perineal area, Face   Body parts bathed by helper: Right arm, Left arm, Front perineal area, Buttocks, Right upper leg, Left upper leg, Right lower leg, Left lower leg     Bathing assist Assist Level: Maximal Assistance - Patient 24 - 49%     Upper Body Dressing/Undressing Upper body dressing  What is the patient wearing?: Pull over shirt    Upper body assist Assist Level: Moderate Assistance - Patient 50 - 74%    Lower Body Dressing/Undressing Lower body dressing      What is the patient wearing?: Underwear/pull up     Lower body assist Assist for lower body dressing: Total Assistance - Patient < 25%     Toileting Toileting    Toileting assist Assist for toileting: Maximal Assistance - Patient 25 - 49%(bedpan)     Transfers Chair/bed transfer  Transfers assist     Chair/bed  transfer assist level: Contact Guard/Touching assist(rw, cues,)     Locomotion Ambulation   Ambulation assist   Ambulation activity did not occur: Safety/medical concerns(bilateral LE weakness, fatigue, SOB)  Assist level: Contact Guard/Touching assist Assistive device: Walker-rolling Max distance: 46ft   Walk 10 feet activity   Assist  Walk 10 feet activity did not occur: Safety/medical concerns(bilateral LE weakness, fatigue, SOB)        Walk 50 feet activity   Assist Walk 50 feet with 2 turns activity did not occur: Safety/medical concerns(bilateral LE weakness, fatigue, SOB)         Walk 150 feet activity   Assist Walk 150 feet activity did not occur: Safety/medical concerns(bilateral LE weakness, fatigue, SOB)         Walk 10 feet on uneven surface  activity   Assist Walk 10 feet on uneven surfaces activity did not occur: Safety/medical concerns(bilateral LE weakness, fatigue, SOB)         Wheelchair     Assist Will patient use wheelchair at discharge?: Yes Type of Wheelchair: Manual Wheelchair activity did not occur: Safety/medical concerns(fatigue, SOB, decreased endurance)         Wheelchair 50 feet with 2 turns activity    Assist    Wheelchair 50 feet with 2 turns activity did not occur: Safety/medical concerns(fatigue, SOB, decreased endurance)       Wheelchair 150 feet activity     Assist  Wheelchair 150 feet activity did not occur: Safety/medical concerns(fatigue, SOB, decreased endurance)       Blood pressure 134/75, pulse 83, temperature 98.6 F (37 C), resp. rate 18, weight (!) 192.3 kg, last menstrual period 03/24/2017, SpO2 100 %.  1.  Impaired mobility, ADLs and function secondary to debility due to hemrrhagic shock due to bleeding adenoma, AKI, and DKA             -patient may  shower             -ELOS/Goals: 7-10 days/ supervision to min assist 2.  Antithrombotics: -DVT/anticoagulation:  Mechanical:  Sequential compression devices, below knee Bilateral lower extremities  -would benefit from Lovenox- will see if possible since her size and immobility             -antiplatelet therapy: N/A 3. Pain Management: Need to educated/encourage patient to work thorough pain as > 7 days post embolization. Continue MS contin with MSIR   4. Mood: LCSW to follow for evaluation and support.              -antipsychotic agents: N/A 5. Neuropsych: This patient is capable of making decisions on her own behalf. 6. Skin/Wound Care:  Antimicrobial fabric to absorb moisture to assist in management of MASD.  7. Fluids/Electrolytes/Nutrition: Continue Ensure max. Will add prostat for depleted protein stores.  8. Liver adenomas with bleeding s/p embolization: Continue to monitor H/H. 9. OSA/OHS: Continue 2L oxygen per [place]--encourage CPAP at nights.  Continue Dulera bid 10. T2DM: Hgb A1c- 8.6--> improving/sees Dr. Loanne Drilling. Continue to monitor BS ac/hs and titrate lantus as indicated--intake poor at this time. Continue Lantus with SSI  CBG (last 3)  Recent Labs    08/30/19 2138 09-25-19 0601 2019-09-25 1138  GLUCAP 135* 108* 127*    1/29- well controlled BGs 11. Acute on chronic renal failure?: SCR improved from 3.10-->1.29  1/29- Cr 1.09 and BUN 20 12. Chronic combined systolic CHF: Check weights daily. Monitor for signs of overload. On Entresto, pravastatin, Bidil and coreg.    Filed Weights   08/30/19 0545 09/25/19 0432  Weight: (!) 195.4 kg (!) 192.3 kg    1/29- will monitor weights for trend, daily  09-24-2022: Weight significantly down.  13. H/o Lupus: On Plaquenil bid with prednisone. On Lyrica for pain management.   14. H/o anxiety/depression: On Wellbutrin and cymbalta with Xanax prn. - getting over recent death of brother.   2022-09-24: Likely exacerbated by SOB. On [place] 2L, satting above 96%. 15. Hematuria?/Vaginal bleeding?: Nuvaring out PTA. Reports heavy bleeding off megestrol--?resume.  16. Constipation:  24-Sep-2022: patient states last BM was Monday and she feels constipated. Will order mag citrate. Patient worried about making it to bathroom on time. Advised that bedside commode will be placed next to her prior to administration.         LOS: 2 days A FACE TO FACE EVALUATION WAS PERFORMED  [name] 25-Sep-2019, 1:54 PM

## 2019-08-31 NOTE — Progress Notes (Signed)
jPhysical Therapy Session Note  Patient Details  Name: [name] MRN: [id number] Date of Birth: 1970/11/14  Today's Date: 08/31/2019 PT Individual Time: 1202-1315 PT Individual Time Calculation (min): 73 min   Short Term Goals: Week 1:  PT Short Term Goal 1 (Week 1): Pt will perform bed mobility mod A PT Short Term Goal 2 (Week 1): Pt will transfer stand<>pivot with LRAD max A PT Short Term Goal 3 (Week 1): Pt will perform car transfer with LRAD max A Week 2:    Week 3:     Skilled Therapeutic Interventions/Progress Updates:     PAIN  8/10 lateral flank/stomach pain, nursing provided pt w/morphine just prior.  Treatment to tolerance  Pt initially supine and agreeable to treatment session with focus on dressing, functional mobility, general strengthening.  Pt supine to sit on edge of bed w/hob raised and min assist.  Pt requesting to use BSC. Unable to stand due to fear of incontinence.  Performed lateral scoot transfer to L side w/min assist, verbal cues, set up, and additional time needed, boosted 4-5 times before reaching full transition to commode.  Brief removed by therapist w/pt in boosted position.  Pt continent of urine/extra time on commode, very fatigued w/lateral scoot transfer. Pt attempted to perform hygiene but unable to reach perineum in sitting or standind.  STS from [facility] w/cga and cues and therapist performed total assist hygiene and bathing of entire perineal region w/repeated STS/standing for several min each effort w/cga w/RW.  Hygiene is made difficult by morbid obesity. Pt transferred [facility] to edge of bed w/RW and cga, cues for safety.  Rested in sitting x 5 min then able to proceed w/dressing.  STS for placement of clean brief by therapist, stands w/rw w/cga In sitting secured brief by donning panties and shorts to knee height - pt lifts feet for threading but unable to assist w/raising clothing.  Repeated STS and pt stood while brief/panties/shorts raised total assist by  therapist.   Pt rested in sitting x 5 min the performed STS and 3 steps w/RW to recliner w/cga, cues for safety. Pt left oob in recliner w/chair alarm set and needs in reach.  PM Session: PAIN mild c/o abdominal area pain w/wc propulsion, limited activity, rest provided   Pt initially supine and agreeable to treatment session with focus on  Supine to sit on edge of bed w/mod assist of 1.  STS to RW - unable w/bed in lowest position, w/bed elevated pt performs w/mn assist.  SPT w/RW w/mn assist and cues for safety.  Pt transported to hallway. wc propulsion x 59ft w/bilat UE's for strengthening and endurance challenge.  Pt cued/ instructed w/efficient stroke technique for propulsion. Task in challenging due to weight of chair/patient. Following task  02 sats 98%, HR 89.   Pt recovered x several min then performed:  STS from wc w/min assist. Gait x 65ft w/cga w/RW and second person for management of 02 and wc follow.  Cadence decreased, deviations primarily obesity related.   Pt transported to rehab gym for cont. Session.   WC to mat lateral scoot w/set up and min assist. In sitting performed global strengthning/cardiovascular activity as follows: Ball toss into rebounder 1 min x 1, 1.5 min x 1, 2 min x 1 w/1-2 min recovery between sets. Mat to wc squat pvt transfer w/min assist and set up.   Pt oriented to unit and discussed rehab schedule/possible activities/equipment avail to address goals, rehab gyms.  Pt then returned  to room. Stand pvt wc to bed w/set up and min assist using RW.   Sit to supine w/max assist for LE management Pt left supine w/rails up x 4, alarm set, bed in lowest position, and needs in reach.  Pt demontrating good progress in all areas and appropriate questions regarding treatment activities/rehab. Good participation in session.   Therapy Documentation Precautions:  Precautions Precautions: Fall Precaution Comments: 2L O2 via [place] Restrictions Weight Bearing  Restrictions: No    Therapy/Group: Individual Therapy  Callie Fielding, Stella 08/31/2019, 4:03 PM

## 2019-09-01 ENCOUNTER — Inpatient Hospital Stay (HOSPITAL_COMMUNITY): Payer: Medicaid Other

## 2019-09-01 LAB — GLUCOSE, CAPILLARY
Glucose-Capillary: 102 mg/dL — ABNORMAL HIGH (ref 70–99)
Glucose-Capillary: 110 mg/dL — ABNORMAL HIGH (ref 70–99)
Glucose-Capillary: 122 mg/dL — ABNORMAL HIGH (ref 70–99)
Glucose-Capillary: 124 mg/dL — ABNORMAL HIGH (ref 70–99)

## 2019-09-01 NOTE — Care Management (Signed)
[facility] Individual Statement of Services  Patient Name:  [name]  Date:  09/01/2019  Welcome to the [facility].  Our goal is to provide you with an individualized program based on your diagnosis and situation, designed to meet your specific needs.  With this comprehensive rehabilitation program, you will be expected to participate in at least 3 hours of rehabilitation therapies Monday-Friday, with modified therapy programming on the weekends.  Your rehabilitation program will include the following services:  Physical Therapy (PT), Occupational Therapy (OT), 24 hour per day rehabilitation nursing, Therapeutic Recreaction (TR), Neuropsychology, Case Management (Social Worker), Rehabilitation Medicine, Nutrition Services and Pharmacy Services  Weekly team conferences will be held on Tuesdays to discuss your progress.  Your Social Worker will talk with you frequently to get your input and to update you on team discussions.  Team conferences with you and your family in attendance may also be held.  Expected length of stay: 2 weeks   Overall anticipated outcome: supervision  Depending on your progress and recovery, your program may change. Your Social Worker will coordinate services and will keep you informed of any changes. Your Social Worker's name and contact numbers are listed  below.  The following services may also be recommended but are not provided by the [facility] will be made to provide these services after discharge if needed.  Arrangements include referral to agencies that provide these services.  Your insurance has been verified to be:  Medicaid Your primary doctor is:  Luciana Axe  Pertinent information will be shared with your doctor and your insurance company.  Social  Worker:  Olga, [place] or [phone]   Information discussed with and copy given to patient by: Lennart Pall, 09/01/2019, 12:23 PM

## 2019-09-01 NOTE — Progress Notes (Signed)
[place] PHYSICAL MEDICINE & REHABILITATION PROGRESS NOTE   Subjective/Complaints: Chest soreness overnight. Vitals stable. Self-resolved Feeling well this morning. Sleepy.   ROS; pt denies SOB, CP, endorses abd pain; denies N/V/D/C; denies vision changes  Objective:   No results found. Recent Labs    08/30/19 0609  WBC 7.3  HGB 8.1*  HCT 24.7*  PLT 320   Recent Labs    08/30/19 0609  NA 140  K 4.3  CL 107  CO2 25  GLUCOSE 118*  BUN 20  CREATININE 1.09*  CALCIUM 8.9    Intake/Output Summary (Last 24 hours) at 09/01/2019 1132 Last data filed at 08/31/2019 1859 Gross per 24 hour  Intake 12 ml  Output 1 ml  Net 11 ml     Physical Exam: Vital Signs Blood pressure (!) 114/53, pulse 86, temperature 98.9 F (37.2 C), resp. rate 16, weight (!) 192.3 kg, last menstrual period 03/24/2017, SpO2 100 %.  Morbidly obese female with BMI of 64+, sleepy but easily arousable HENT:  Head: Normocephalic and atraumatic.  Nose: Nose normal. [place] in place.  Mouth/Throat: Oropharynx is clear and moist. No oropharyngeal exudate.  Eyes:  Conjunctivae and EOM are normal.  Neck: No tracheal deviation present.  Cardiovascular: Normal rate and regular rhythm.  Respiratory: Effort normal. No stridor. No respiratory distress. She has wheezes (occasional upper airway sounds). She has no rales.  GI: Soft. Bowel sounds are normal. There is no rebound and no guarding.  Soft, protuberant, mildly TTP diffusely; ND, (+) hypoactive BS  Musculoskeletal:        General: No tenderness or edema.     Cervical back: Normal range of motion and neck supple.     Comments: B/L UEs- deltoi 4-/5, biceps 4/5, triceps 4/5, WE 4/5, grip 4/5, finger abd 4-/5 LEs- HF 2+/5, KE 2+/5, DF/PF 4-/5 Very large LEs- hard to move for examiner or pt.   Neurological: She is alert and oriented to person, place, and time. No cranial nerve deficit. Coordination normal.  Sensation intact in all 4 extremities to light  touch Skin: Skin is warm and extremely dry, esp on arms Right breast intertriginous area with large area of denuded tissue with white eschar. Area under left breast moist and macerated appearing--tender to touch.  1 IV in L forearm- no signs of infiltration  Psychiatric:  Slow to process/respond Flat affect   Assessment/Plan: 1. Functional deficits secondary to debility due to liver bleeding adenoma and DKA/AKI which require 3+ hours per day of interdisciplinary therapy in a comprehensive inpatient rehab setting.  Physiatrist is providing close team supervision and 24 hour management of active medical problems listed below.  Physiatrist and rehab team continue to assess barriers to discharge/monitor patient progress toward functional and medical goals  Care Tool:  Bathing    Body parts bathed by patient: Right arm, Left arm, Chest, Abdomen, Front perineal area, Face   Body parts bathed by helper: Right arm, Left arm, Front perineal area, Buttocks, Right upper leg, Left upper leg, Right lower leg, Left lower leg     Bathing assist Assist Level: Maximal Assistance - Patient 24 - 49%     Upper Body Dressing/Undressing Upper body dressing   What is the patient wearing?: Pull over shirt    Upper body assist Assist Level: Moderate Assistance - Patient 50 - 74%    Lower Body Dressing/Undressing Lower body dressing      What is the patient wearing?: Incontinence brief, Pants     Lower  body assist Assist for lower body dressing: Total Assistance - Patient < 25%     Toileting Toileting    Toileting assist Assist for toileting: Maximal Assistance - Patient 25 - 49%     Transfers Chair/bed transfer  Transfers assist     Chair/bed transfer assist level: Contact Guard/Touching assist     Locomotion Ambulation   Ambulation assist   Ambulation activity did not occur: Safety/medical concerns(bilateral LE weakness, fatigue, SOB)  Assist level: Contact Guard/Touching  assist Assistive device: Walker-rolling Max distance: 10   Walk 10 feet activity   Assist  Walk 10 feet activity did not occur: Safety/medical concerns(bilateral LE weakness, fatigue, SOB)  Assist level: Contact Guard/Touching assist Assistive device: Walker-rolling   Walk 50 feet activity   Assist Walk 50 feet with 2 turns activity did not occur: Safety/medical concerns(bilateral LE weakness, fatigue, SOB)         Walk 150 feet activity   Assist Walk 150 feet activity did not occur: Safety/medical concerns(bilateral LE weakness, fatigue, SOB)         Walk 10 feet on uneven surface  activity   Assist Walk 10 feet on uneven surfaces activity did not occur: Safety/medical concerns(bilateral LE weakness, fatigue, SOB)         Wheelchair     Assist Will patient use wheelchair at discharge?: Yes Type of Wheelchair: Manual Wheelchair activity did not occur: Safety/medical concerns(fatigue, SOB, decreased endurance)         Wheelchair 50 feet with 2 turns activity    Assist    Wheelchair 50 feet with 2 turns activity did not occur: Safety/medical concerns(fatigue, SOB, decreased endurance)       Wheelchair 150 feet activity     Assist  Wheelchair 150 feet activity did not occur: Safety/medical concerns(fatigue, SOB, decreased endurance)       Blood pressure (!) 114/53, pulse 86, temperature 98.9 F (37.2 C), resp. rate 16, weight (!) 192.3 kg, last menstrual period 03/24/2017, SpO2 100 %.  1.  Impaired mobility, ADLs and function secondary to debility due to hemrrhagic shock due to bleeding adenoma, AKI, and DKA             -patient may  shower             -ELOS/Goals: 7-10 days/ supervision to min assist 2.  Antithrombotics: -DVT/anticoagulation:  Mechanical: Sequential compression devices, below knee Bilateral lower extremities  -would benefit from Lovenox- will see if possible since her size and immobility             -antiplatelet  therapy: N/A 3. Pain Management: Need to educated/encourage patient to work thorough pain as > 7 days post embolization. Continue MS contin with MSIR   4. Mood: LCSW to follow for evaluation and support.              -antipsychotic agents: N/A 5. Neuropsych: This patient is capable of making decisions on her own behalf. 6. Skin/Wound Care:  Antimicrobial fabric to absorb moisture to assist in management of MASD.  7. Fluids/Electrolytes/Nutrition: Continue Ensure max. Will add prostat for depleted protein stores.  8. Liver adenomas with bleeding s/p embolization: Continue to monitor H/H. 9. OSA/OHS: Continue 2L oxygen per --encourage CPAP at nights. Continue Dulera bid 10. T2DM: Hgb A1c- 8.6--> improving/sees Dr. Loanne Drilling. Continue to monitor BS ac/hs and titrate lantus as indicated--intake poor at this time. Continue Lantus with SSI  CBG (last 3)  Recent Labs    08/31/19 1649 08/31/19 2100 09/01/19 [id number]  GLUCAP 135* 156* 102*    1/29, 1/31- well controlled BGs 11. Acute on chronic renal failure?: SCR improved from 3.10-->1.29  1/29- Cr 1.09 and BUN 20 12. Chronic combined systolic CHF: Check weights daily. Monitor for signs of overload. On Entresto, pravastatin, Bidil and coreg.    Filed Weights   08/30/19 0545 2019/09/14 0432  Weight: (!) 195.4 kg (!) 192.3 kg    1/29- will monitor weights for trend, daily  Sep 13, 2022: Weight significantly down.   1/31: no weight 13. H/o Lupus: On Plaquenil bid with prednisone. On Lyrica for pain management.   14. H/o anxiety/depression: On Wellbutrin and cymbalta with Xanax prn. - getting over recent death of brother.   2022/09/13: Likely exacerbated by SOB. On [place] 2L, satting above 96%. 15. Hematuria?/Vaginal bleeding?: Nuvaring out PTA. Reports heavy bleeding off megestrol--?resume.  16. Constipation: 09-13-22: patient states last BM was Monday and she feels constipated. Will order mag citrate. Patient worried about making it to bathroom on time. Advised that  bedside commode will be placed next to her prior to administration.         LOS: 3 days A FACE TO FACE EVALUATION WAS PERFORMED  Clide Deutscher Hser Belanger 09/01/2019, 11:32 AM

## 2019-09-01 NOTE — Progress Notes (Signed)
Physical Therapy Session Note  Patient Details  Name: [name] MRN: [id number] Date of Birth: 1970/09/08  Today's Date: 09/01/2019 PT Individual Time: 1002-1104 PT Individual Time Calculation (min): 62 min   Short Term Goals: Week 1:  PT Short Term Goal 1 (Week 1): Pt will perform bed mobility mod A PT Short Term Goal 2 (Week 1): Pt will transfer stand<>pivot with LRAD max A PT Short Term Goal 3 (Week 1): Pt will perform car transfer with LRAD max A  Skilled Therapeutic Interventions/Progress Updates:     Patient in bed asleep upon PT arrival. Patient easily aroused to verbal stimulation and agreeable to PT session. Patient reported 3/10 back and R flank pain during session, declined pain medicine. PT provided repositioning, rest breaks, and distraction as pain interventions throughout session. Patient was on 2L/min O2 at beginning of session and remained on 2L/min O2 throughout session with all mobility.   Therapeutic Activity: Bed Mobility: Patient performed supine to/from sit with min A for trunk and LE management with min use of bed rails in a flat bed. Provided verbal cues for sliding LEs off first then pushing to sit up. Transfers: Patient performed stand pivot from an elevated bed>BSC and sit to/from stand from the w/c x3 with min A using a bariatric RW. Provided verbal cues for leaning forward to stand, hand placement on RW, and reaching back to sit to control descent for safety. Patient was continent of bladder on BSC. Required total A for peri-care and max A for LB dressing to doff incontinence brief and shorts and don a new incontinence brief and pants.   Gait Training:  Patient ambulated 5 feet x2 and 86 feet x1 using a bariatric RW with CGA-close supervision and w/c follow due to decreased activity tolerance. Ambulated with decreased gait speed, decreased step length and height, increased B hip and knee flexion in stance, forward trunk lean, and downward head gaze. Provided  verbal cues for erect posture, paced breathing, and increased step height for increased breath support and decreased fall risk with gait. Patient ascended/descended 3-6" steps using B rails with min A. Performed step-to gait pattern leading with R while ascending forwards and L while descending backwards. Provided cues for technique and sequencing. Had a second person in front for safety.  Patient on 2L/min O2, SPO2 94% after each trial of gait and stair training HR 113-116 BPM. Recovered to 97% and 99 bmp in <1 min with cues for pursed lip breathing.   Wheelchair Mobility:  Patient was transported in the w/c with total A throughout session for energy conservation and time management.  Patient in bed on 2L/min O2 at end of session with breaks locked, bed alarm set, and all needs within reach. Educated patient on benefits of sitting OOB, patient agreeable to sitting up for lunch with nursing this afternoon.     Therapy Documentation Precautions:  Precautions Precautions: Fall Precaution Comments: 2L O2 via [place] Restrictions Weight Bearing Restrictions: No    Therapy/Group: Individual Therapy  Keola Heninger L Jemar Paulsen PT, DPT  09/01/2019, 12:17 PM

## 2019-09-02 ENCOUNTER — Inpatient Hospital Stay (HOSPITAL_COMMUNITY): Payer: Medicaid Other | Admitting: Occupational Therapy

## 2019-09-02 ENCOUNTER — Inpatient Hospital Stay (HOSPITAL_COMMUNITY): Payer: Medicaid Other

## 2019-09-02 LAB — CBC
HCT: 25 % — ABNORMAL LOW (ref 36.0–46.0)
Hemoglobin: 7.9 g/dL — ABNORMAL LOW (ref 12.0–15.0)
MCH: 28 pg (ref 26.0–34.0)
MCHC: 31.6 g/dL (ref 30.0–36.0)
MCV: 88.7 fL (ref 80.0–100.0)
Platelets: 383 10*3/uL (ref 150–400)
RBC: 2.82 MIL/uL — ABNORMAL LOW (ref 3.87–5.11)
RDW: 14.6 % (ref 11.5–15.5)
WBC: 7.1 10*3/uL (ref 4.0–10.5)
nRBC: 0 % (ref 0.0–0.2)

## 2019-09-02 LAB — BASIC METABOLIC PANEL
Anion gap: 10 (ref 5–15)
BUN: 21 mg/dL — ABNORMAL HIGH (ref 6–20)
CO2: 21 mmol/L — ABNORMAL LOW (ref 22–32)
Calcium: 8.7 mg/dL — ABNORMAL LOW (ref 8.9–10.3)
Chloride: 107 mmol/L (ref 98–111)
Creatinine, Ser: 1.4 mg/dL — ABNORMAL HIGH (ref 0.44–1.00)
GFR calc Af Amer: 51 mL/min — ABNORMAL LOW (ref >60)
GFR calc non Af Amer: 44 mL/min — ABNORMAL LOW (ref >60)
Glucose, Bld: 93 mg/dL (ref 70–99)
Potassium: 4.3 mmol/L (ref 3.5–5.1)
Sodium: 138 mmol/L (ref 135–145)

## 2019-09-02 LAB — GLUCOSE, CAPILLARY
Glucose-Capillary: 93 mg/dL (ref 70–99)
Glucose-Capillary: 120 mg/dL — ABNORMAL HIGH (ref 70–99)
Glucose-Capillary: 163 mg/dL — ABNORMAL HIGH (ref 70–99)
Glucose-Capillary: 123 mg/dL — ABNORMAL HIGH (ref 70–99)

## 2019-09-02 NOTE — IPOC Note (Signed)
Overall Plan of Care [facility]) Patient Details Name: [name] MRN: [id number] DOB: 04/30/1971  Admitting Diagnosis: Physical debility  Hospital Problems: Principal Problem:   Physical debility Active Problems:   SLE   SJOGREN'S SYNDROME   HTN (hypertension)   Morbid obesity ([place])   Diabetic gastroparesis ([place])   Diabetes ([place])   Hepatic adenoma   Obesity hypoventilation syndrome ([place])   Pressure injury of skin   Acute on chronic combined systolic and diastolic CHF (congestive heart failure) (HCC)     Functional Problem List: Nursing Bladder, Pain, Safety, Skin Integrity, Bowel  PT Balance, Endurance, Motor, Pain  OT Balance, Endurance, Skin Integrity, Motor  SLP    TR         Basic ADL's: OT Eating, Grooming, Bathing, Dressing, Toileting, Other (comment)     Advanced  ADL's: OT Simple Meal Preparation, Light Housekeeping     Transfers: PT Bed Mobility, Bed to Chair, Car, Manufacturing systems engineer, Metallurgist: PT Ambulation, Emergency planning/management officer, Stairs     Additional Impairments: OT    SLP        TR      Anticipated Outcomes Item Anticipated Outcome  Self Feeding independent  Swallowing      Basic self-care  supervision  Toileting  supervision   Bathroom Transfers supervision  Bowel/Bladder  cont x 2, min/mod assist  Transfers  Supervision with LRAD  Locomotion  Supervision with LRAD  Communication     Cognition     Pain  less than 3 on 0-10 scale  Safety/Judgment  cues/reminders   Therapy Plan: PT Intensity: Minimum of 1-2 x/day ,45 to 90 minutes PT Frequency: 5 out of 7 days PT Duration Estimated Length of Stay: 14-18 days OT Intensity: Minimum of 1-2 x/day, 45 to 90 minutes OT Frequency: 5 out of 7 days OT Duration/Estimated Length of Stay: 2 weeks     Due to the current state of emergency, patients may not be receiving their 3-hours of Medicare-mandated therapy.   Team Interventions: Nursing Interventions  Patient/Family Education, Bladder Management, Bowel Management, Disease Management/Prevention, Pain Management, Skin Care/Wound Management, Discharge Planning  PT interventions Ambulation/gait training, Discharge planning, Functional mobility training, Therapeutic Activities, Balance/vestibular training, Disease management/prevention, Neuromuscular re-education, Therapeutic Exercise, Wheelchair propulsion/positioning, DME/adaptive equipment instruction, Pain management, Splinting/orthotics, UE/LE Strength taining/ROM, Community reintegration, Technical sales engineer stimulation, Patient/family education, IT trainer, UE/LE Coordination activities  OT Interventions Training and development officer, Self Care/advanced ADL retraining, Therapeutic Exercise, Wheelchair propulsion/positioning, DME/adaptive equipment instruction, Skin care/wound managment, UE/LE Strength taining/ROM, Community reintegration, Barrister's clerk education, Discharge planning, Functional mobility training, Therapeutic Activities  SLP Interventions    TR Interventions    SW/CM Interventions Discharge Planning, Psychosocial Support, Patient/Family Education   Barriers to Discharge MD  Medical stability, Home enviroment access/loayout, Wound care, Lack of/limited family support, Weight, Medication compliance and Behavior  Nursing      PT Home environment access/layout, Weight 8 STE  OT Home environment access/layout steps to enter home  SLP      SW       Team Discharge Planning: Destination: PT-Home ,OT- Home , SLP-  Projected Follow-up: PT-Home health PT, OT-  Home health OT, SLP-  Projected Equipment Needs: PT-To be determined, OT-  , SLP-  Equipment Details: PT-Has rollator and SPC at home, OT-patient owns shower seat, commode Patient/family involved in discharge planning: PT- Patient,  OT-Patient, SLP-   MD ELOS: 14-18 days Medical Rehab Prognosis:  Good Assessment: Pt is a 49 yr old female with  hx of morbid obesity with  BMI of 64, combined systolic and disstolic CHF, obesity hypoventilation syndrome and OSA- on 2L O2 and CPAP; DMII, Lupss, anxiety and depression, and constipation; as well as liver adenoma s/p embolization for hemorrhage and DKA.   Goals- Supervision to [facility]  See Team Conference Notes for weekly updates to the plan of care

## 2019-09-02 NOTE — Progress Notes (Signed)
Occupational Therapy Session Note  Patient Details  Name: [name] MRN: [id number] Date of Birth: 1971-04-16  Today's Date: 09/02/2019 OT Individual Time: 0830-0900 OT Individual Time Calculation (min): 30 min    Short Term Goals: Week 1:  OT Short Term Goal 1 (Week 1): Patient will complete bed mobility with mod A of one OT Short Term Goal 2 (Week 1): patient will complete sit to stand and SPT with RW min A of one OT Short Term Goal 3 (Week 1): patient will complete UB bathing/dressing with set up OT Short Term Goal 4 (Week 1): patient will complete LB bathing/dressing with mod A and demonstrate good carryover of AD use OT Short Term Goal 5 (Week 1): patient will tolerate standing for grooming tasks at sink for 5 minutes with CGA  Skilled Therapeutic Interventions/Progress Updates:    Treatment session with focus on functional transfers, standing balance, and activity tolerance. Pt received supine in bed agreeable to therapy session.  Pt stating already washing up this AM prior to session, but agreeable to grooming tasks.  Pt completed bed mobility with CGA and donned hospital gowns with setup.  Completed sit > stand from EOB with min assist.  Pt completed stand pivot transfer bed > BSC with RW with min assist.  Pt able to complete clothing management prior to toileting and hygiene with CGA for standing balance, however did require assist to pull underwear up post toileting.  Pt ambulated 10' with RW to sink.  Encouraged pt to complete oral care in standing, however pt reports not feeling up to standing for task therefore completed in sitting.  Pt passed of to next therapy session.  Therapy Documentation Precautions:  Precautions Precautions: Fall Precaution Comments: 2L O2 via [place] Restrictions Weight Bearing Restrictions: No Pain:  Pt with no c/o pain   Therapy/Group: Individual Therapy  Simonne Come 09/02/2019, 12:14 PM

## 2019-09-02 NOTE — Progress Notes (Signed)
Physical Therapy Session Note  Patient Details  Name: [name] MRN: [id number] Date of Birth: 13-Mar-1971  Today's Date: 09/02/2019 PT Individual Time: 1100-1200 PT Individual Time Calculation (min): 60 min   Short Term Goals: Week 1:  PT Short Term Goal 1 (Week 1): Pt will perform bed mobility mod A PT Short Term Goal 2 (Week 1): Pt will transfer stand<>pivot with LRAD max A PT Short Term Goal 3 (Week 1): Pt will perform car transfer with LRAD max A  Skilled Therapeutic Interventions/Progress Updates:     Patient in bed on 2L O2 upon PT arrival. Patient alert and agreeable to PT session. Patient reported 2/10 low back and R flank pain during session, reported that she was pre-medicated prior to session. PT provided repositioning, rest breaks, and distraction as pain interventions throughout session. Discussed O2 use at home, patient reported that she wears 2L of O2 at home at all times, only taking it off briefly when her nose gets dry. Educated on use of pulse oximeter to monitor O2 sats when oxygen is off, also educated on safe O2 levels and consequences of having oxygen desaturation. Patient reported that she does not wear oxygen when going out due to not having a portable tank. She reports that her doctor did not provide her with this when she was told she needed to wear oxygen at home. Discussed this with Pam, PA, who stated that she will need portable O2 when in the community. Reached out to CSW to secure portable O2 at discharge for patient to access the community with continues oxygen support.   Therapeutic Activity: Bed Mobility: Patient performed supine to sit with supervision and sit to supine with min A for LE managment. Provided verbal cues for bringing knees to chest to lift LEs into the bed. She donned paper scrub pants while sitting EOB with max A to thread LEs through and mod A to pull pants up in standing. Transfers: Patient performed sit to/from stand x2 and stand pivot x1  with CGA-close supervision using a bariatric RW. Provided verbal cues for hand placement and reaching back to sit to control descent.  Gait Training:  Attempted to try gait training with a bariatric rollator, as that is what the patient used PTA, however, rehab's rollator has a break that is not working properly and PT was unable to find a tool to tighten it. Kelvin, rehab tech, was informed about the equipment. Patient was able to teach how she used the breaks and transfer for sitting using the rollator, explaining safe technique. Discussed having her sister bring in her rollator when she comes tonight. Patient in agreement, stating "I would rather use mine."  Patient ambulated 30 feet x2 using a bariatric RW with close supervision. Ambulated with decreased gait speed, decreased step length and height, B abducted gait with lateral trunk lean, wide BOS, forward trunk lean, and downward head gaze. Provided verbal cues for erect posture, staying close to the RW for safety, paced breathing for increased breath support, and increased step height for safety. RPE 8/10 after each trial. Following her second trial she reported feeling light-headed and asked to sit down. She stated that she feels this way sometimes when she "exherts" herself. Upon further questioning, she reported that she has had 2 falls due to feeling light-headed. Patient reported that her symptoms resolved during this conversation in <1 min of sitting. Patient requested to go back to the room to lie down due to fatigue after.  Wheelchair Mobility:  Patient was transported in the w/c with total A throughout session for energy conservation and time management.  Patient in bed on 2L O2 at end of session with breaks locked, bed alarm set, and all needs within reach. Patient remained on 2L O2 throughout session, SPO2 >92% with all mobility, HR 94-109 bmp.    Therapy Documentation Precautions:  Precautions Precautions: Fall Precaution Comments:  2L O2 via [place] Restrictions Weight Bearing Restrictions: No    Therapy/Group: Individual Therapy  Erion Hermans L Garrin Kirwan PT, DPT  09/02/2019, 3:16 PM

## 2019-09-02 NOTE — Progress Notes (Signed)
[place] PHYSICAL MEDICINE & REHABILITATION PROGRESS NOTE   Subjective/Complaints:   Pt reports no issues.   Literally, denies any complaints when asked- said eating and sleeping well; on O2 24/7 and is used to it.- Just woke up, so said no issues "so far".   ROS; pt denies SOB, CP, endorses abd pain; denies N/V/D/C; denies vision changes  Objective:   No results found. Recent Labs    09/02/19 0602  WBC 7.1  HGB 7.9*  HCT 25.0*  PLT 383   Recent Labs    09/02/19 0602  NA 138  K 4.3  CL 107  CO2 21*  GLUCOSE 93  BUN 21*  CREATININE 1.40*  CALCIUM 8.7*    Intake/Output Summary (Last 24 hours) at 09/02/2019 1535 Last data filed at 09/02/2019 1249 Gross per 24 hour  Intake 720 ml  Output --  Net 720 ml     Physical Exam: Vital Signs Blood pressure (!) 113/51, pulse 87, temperature 98.6 F (37 C), resp. rate 14, weight (!) 192.3 kg, last menstrual period 03/24/2017, SpO2 100 %.  Morbidly obese female with BMI of 64+ laying supine in bed; covered by sheet to nose, wearing 2L O2 by [place], NAD HENT:  Head: Normocephalic and atraumatic.  Nose: Nose normal. [place] in place.  Mouth/Throat: Oropharynx is clear and moist.  Eyes:  Conjunctivae and EOM are normal.  Neck: No tracheal deviation present.  Cardiovascular: Normal rate and regular rhythm.  Respiratory:CTA B/L, however decreased breath sounds, esp at bases- due to hypoventilation syndrome GI: Soft. Bowel sounds are normal. There is no rebound and no guarding.  Soft, protuberant, mildly TTP diffusely; ND, (+) hypoactive BS  Musculoskeletal:        General: No tenderness or edema.     Cervical back: Normal range of motion and neck supple.     Comments: B/L UEs- deltoi 4-/5, biceps 4/5, triceps 4/5, WE 4/5, grip 4/5, finger abd 4-/5 LEs- HF 2+/5, KE 2+/5, DF/PF 4-/5 Very large LEs- hard to move for examiner or pt.   Neurological:Sensation intact in all 4 extremities to light touch Skin: Skin is warm and extremely  dry, esp on arms Right breast intertriginous area with large area of denuded tissue with white eschar. Area under left breast moist and macerated appearing--tender to touch.  1 IV in L forearm- no signs of infiltration  Psychiatric:  Slow to process/respond Flat affect   Assessment/Plan: 1. Functional deficits secondary to debility due to liver bleeding adenoma and DKA/AKI which require 3+ hours per day of interdisciplinary therapy in a comprehensive inpatient rehab setting.  Physiatrist is providing close team supervision and 24 hour management of active medical problems listed below.  Physiatrist and rehab team continue to assess barriers to discharge/monitor patient progress toward functional and medical goals  Care Tool:  Bathing    Body parts bathed by patient: Right arm, Left arm, Chest, Abdomen, Front perineal area, Face   Body parts bathed by helper: Right arm, Left arm, Front perineal area, Buttocks, Right upper leg, Left upper leg, Right lower leg, Left lower leg     Bathing assist Assist Level: Maximal Assistance - Patient 24 - 49%     Upper Body Dressing/Undressing Upper body dressing   What is the patient wearing?: Pull over shirt    Upper body assist Assist Level: Moderate Assistance - Patient 50 - 74%    Lower Body Dressing/Undressing Lower body dressing      What is the patient wearing?: Incontinence  brief, Pants     Lower body assist Assist for lower body dressing: Moderate Assistance - Patient 50 - 74%     Toileting Toileting    Toileting assist Assist for toileting: Moderate Assistance - Patient 50 - 74%     Transfers Chair/bed transfer  Transfers assist     Chair/bed transfer assist level: Contact Guard/Touching assist Chair/bed transfer assistive device: Programmer, multimedia   Ambulation assist   Ambulation activity did not occur: Safety/medical concerns(bilateral LE weakness, fatigue, SOB)  Assist level:  Supervision/Verbal cueing Assistive device: Walker-rolling Max distance: 52'   Walk 10 feet activity   Assist  Walk 10 feet activity did not occur: Safety/medical concerns(bilateral LE weakness, fatigue, SOB)  Assist level: Supervision/Verbal cueing Assistive device: Walker-rolling   Walk 50 feet activity   Assist Walk 50 feet with 2 turns activity did not occur: Safety/medical concerns(bilateral LE weakness, fatigue, SOB)  Assist level: Supervision/Verbal cueing Assistive device: Walker-rolling    Walk 150 feet activity   Assist Walk 150 feet activity did not occur: Safety/medical concerns(bilateral LE weakness, fatigue, SOB)         Walk 10 feet on uneven surface  activity   Assist Walk 10 feet on uneven surfaces activity did not occur: Safety/medical concerns(bilateral LE weakness, fatigue, SOB)         Wheelchair     Assist Will patient use wheelchair at discharge?: Yes Type of Wheelchair: Manual Wheelchair activity did not occur: Safety/medical concerns(fatigue, SOB, decreased endurance)         Wheelchair 50 feet with 2 turns activity    Assist    Wheelchair 50 feet with 2 turns activity did not occur: Safety/medical concerns(fatigue, SOB, decreased endurance)       Wheelchair 150 feet activity     Assist  Wheelchair 150 feet activity did not occur: Safety/medical concerns(fatigue, SOB, decreased endurance)       Blood pressure (!) 113/51, pulse 87, temperature 98.6 F (37 C), resp. rate 14, weight (!) 192.3 kg, last menstrual period 03/24/2017, SpO2 100 %.  1.  Impaired mobility, ADLs and function secondary to debility due to hemrrhagic shock due to bleeding adenoma, AKI, and DKA             -patient may  shower             -ELOS/Goals: 7-10 days/ supervision to min assist 2.  Antithrombotics: -DVT/anticoagulation:  Mechanical: Sequential compression devices, below knee Bilateral lower extremities  -would benefit from Lovenox-  will see if possible since her size and immobility             -antiplatelet therapy: N/A 3. Pain Management: Need to educated/encourage patient to work thorough pain as > 7 days post embolization. Continue MS contin with MSIR   4. Mood: LCSW to follow for evaluation and support.              -antipsychotic agents: N/A 5. Neuropsych: This patient is capable of making decisions on her own behalf. 6. Skin/Wound Care:  Antimicrobial fabric to absorb moisture to assist in management of MASD.  7. Fluids/Electrolytes/Nutrition: Continue Ensure max. Will add prostat for depleted protein stores.  8. Liver adenomas with bleeding s/p embolization: Continue to monitor H/H. 9. OSA/OHS: Continue 2L oxygen per [place]--encourage CPAP at nights. Continue Dulera bid 10. T2DM: Hgb A1c- 8.6--> improving/sees Dr. Loanne Drilling. Continue to monitor BS ac/hs and titrate lantus as indicated--intake poor at this time. Continue Lantus with SSI  CBG (last 3)  Recent Labs    09/01/19 2104 09/02/19 0630 09/02/19 1127  GLUCAP 124* 93 120*    2/1- well controlled BGs 11. Acute on chronic renal failure?: SCR improved from 3.10-->1.29  1/29- Cr 1.09 and BUN 20  2/1- Cr bounced back to 1.4- BUN 21- no Lasix/fluid pill on board- will encourage slight increase in fluid intake and recheck Wednesday.  12. Chronic combined systolic CHF: Check weights daily. Monitor for signs of overload. On Entresto, pravastatin, Bidil and coreg.    Filed Weights   08/30/19 0545 30-Sep-2019 0432  Weight: (!) 195.4 kg (!) 192.3 kg    1/29- will monitor weights for trend, daily  29-Sep-2022: Weight significantly down.   1/31: no weight  2/1- no weight 13. H/o Lupus: On Plaquenil bid with prednisone. On Lyrica for pain management.   14. H/o anxiety/depression: On Wellbutrin and cymbalta with Xanax prn. - getting over recent death of brother.   September 29, 2022: Likely exacerbated by SOB. On [place] 2L, satting above 96%. 15. Hematuria?/Vaginal bleeding?: Nuvaring out PTA.  Reports heavy bleeding off megestrol--?resume.  16. Constipation: 09/29/2022: patient states last BM was Monday and she feels constipated. Will order mag citrate. Patient worried about making it to bathroom on time. Advised that bedside commode will be placed next to her prior to administration.   2/1- 3 BMs in last 36 hours- last 1 at 2pm- hopefully feeling better.        LOS: 4 days A FACE TO FACE EVALUATION WAS PERFORMED  Rafferty Postlewait 09/02/2019, 3:35 PM

## 2019-09-02 NOTE — Progress Notes (Signed)
Occupational Therapy Session Note  Patient Details  Name: [name] MRN: [id number] Date of Birth: 1971-06-08  Today's Date: 09/02/2019 OT Individual Time: 0900-1000 OT Individual Time Calculation (min): 60 min    Short Term Goals: Week 1:  OT Short Term Goal 1 (Week 1): Patient will complete bed mobility with mod A of one OT Short Term Goal 2 (Week 1): patient will complete sit to stand and SPT with RW min A of one OT Short Term Goal 3 (Week 1): patient will complete UB bathing/dressing with set up OT Short Term Goal 4 (Week 1): patient will complete LB bathing/dressing with mod A and demonstrate good carryover of AD use OT Short Term Goal 5 (Week 1): patient will tolerate standing for grooming tasks at sink for 5 minutes with CGA  Skilled Therapeutic Interventions/Progress Updates:    Patient seated in recliner, finishing up self care tasks at sink at start of session.  On 2L O2 via [place].  She is able to stand from recliner and complete short distance ambulation with RW to w/c with CGA.  She is dependent for w/c mobility.  SPT with RW to mat with CGA.  She completed seated UB and trunk conditioning activities and standing balance and reach activities from the mat for 40 minutes with brief rest breaks in between  - standing tolerance is approx 1 minute with dynamic activity with CS.  Reviewed plan for therapy and mobility goals.  Ambulation with RW back to bed at close of session with CS for transfer, min A to get legs into bed.  Moved from portable O2 to wall unit.  Call bell in reach and bed alarm set at close of session.    Therapy Documentation Precautions:  Precautions Precautions: Fall Precaution Comments: 2L O2 via [place] Restrictions Weight Bearing Restrictions: No General:   Vital Signs:  Pain: Pain Assessment Pain Scale: 0-10 Pain Score: 0-No pain   Other Treatments:     Therapy/Group: Individual Therapy  Carlos Levering 09/02/2019, 1:15 PM

## 2019-09-02 NOTE — Progress Notes (Signed)
Occupational Therapy Session Note  Patient Details  Name: [name] MRN: [id number] Date of Birth: 12-09-1970  Today's Date: 09/02/2019 OT Individual Time: [id number] OT Individual Time Calculation (min): 54 min    Short Term Goals: Week 1:  OT Short Term Goal 1 (Week 1): Patient will complete bed mobility with mod A of one OT Short Term Goal 2 (Week 1): patient will complete sit to stand and SPT with RW min A of one OT Short Term Goal 3 (Week 1): patient will complete UB bathing/dressing with set up OT Short Term Goal 4 (Week 1): patient will complete LB bathing/dressing with mod A and demonstrate good carryover of AD use OT Short Term Goal 5 (Week 1): patient will tolerate standing for grooming tasks at sink for 5 minutes with CGA  Skilled Therapeutic Interventions/Progress Updates:    Upon entering the room, pt supine in bed and reports being very fatigued from previous therapy sessions. Pt having difficulty keeping eyes open but agreeable to OT intervention but declines OOB activities. OT educated and demonstrated use of green level 3 resistive theraband exercises for B  UE strengthening. Pt performing 2 sets of 10 chest pulls, shoulder diagonals, shoulder elevation, bicep curls, and alternating punches with rest breaks as needed. OT educated pt on energy conservation techniques while taking rest breaks and paper handout provided for pt to review when able. Pt remained on 2 L O2 via [place] throughout session. Bed alarm activated and call bell within reach.  Therapy Documentation Precautions:  Precautions Precautions: Fall Precaution Comments: 2L O2 via [place] Restrictions Weight Bearing Restrictions: No Vital Signs: Therapy Vitals Temp: 98.6 F (37 C) Pulse Rate: 87 Resp: 14 BP: (!) 113/51 Patient Position (if appropriate): Lying Oxygen Therapy SpO2: 100 % O2 Device: Nasal Cannula Pain: Pain Assessment Pain Scale: 0-10 Pain Score: 0-No pain ADL: ADL Eating:  Supervision/safety, Set up Where Assessed-Eating: Bed level Grooming: Minimal assistance Where Assessed-Grooming: Edge of bed Upper Body Bathing: Moderate assistance Where Assessed-Upper Body Bathing: Edge of bed Lower Body Bathing: Dependent Where Assessed-Lower Body Bathing: Edge of bed Upper Body Dressing: Minimal assistance Where Assessed-Upper Body Dressing: Edge of bed Lower Body Dressing: Dependent Where Assessed-Lower Body Dressing: Edge of bed Toileting: Dependent   Therapy/Group: Individual Therapy  Gypsy Decant 09/02/2019, 3:25 PM

## 2019-09-03 ENCOUNTER — Inpatient Hospital Stay (HOSPITAL_COMMUNITY): Payer: Medicaid Other | Admitting: Occupational Therapy

## 2019-09-03 ENCOUNTER — Inpatient Hospital Stay (HOSPITAL_COMMUNITY): Payer: Medicaid Other

## 2019-09-03 LAB — GLUCOSE, CAPILLARY
Glucose-Capillary: 114 mg/dL — ABNORMAL HIGH (ref 70–99)
Glucose-Capillary: 103 mg/dL — ABNORMAL HIGH (ref 70–99)
Glucose-Capillary: 123 mg/dL — ABNORMAL HIGH (ref 70–99)
Glucose-Capillary: 122 mg/dL — ABNORMAL HIGH (ref 70–99)

## 2019-09-03 NOTE — Patient Care Conference (Signed)
Inpatient RehabilitationTeam Conference and Plan of Care Update Date: 09/03/2019   Time: 10:00 AM    Patient Name: [name]      Medical Record Number: [id number]  Date of Birth: 1971/02/07 Sex: Female         Room/Bed: 4W01C/4W01C-01 Payor Info: Payor: MEDICAID [place] / Plan: MEDICAID [place] ACCESS / Product Type: *No Product type* /    Admit Date/Time:  08/29/2019  6:35 PM  Primary Diagnosis:  Physical debility  Patient Active Problem List   Diagnosis Date Noted  . Pressure injury of skin 08/30/2019  . Acute on chronic combined systolic and diastolic CHF (congestive heart failure) ([place]) 08/30/2019  . Physical debility 08/29/2019  . Obesity hypoventilation syndrome ([place]) 08/23/2019  . Skin breakdown 08/23/2019  . Essential hypertension 08/23/2019  . Liver hemorrhage 08/23/2019  . Acute blood loss anemia 08/13/2019  . Hepatic adenoma   . Hemoperitoneum   . Diabetic acidosis without coma ([place])   . Multifocal pneumonia 09/23/2017  . Acute respiratory failure ([place]) 09/21/2017  . CHF exacerbation ([place]) 09/20/2017  . Costochondritis 06/06/2017  . Chest pain 06/04/2017  . Diabetes ([place]) 09/19/2015  . Radiculopathy of cervical spine   . Severe headache 03/05/2014  . Family history of malignant neoplasm of breast   . Family history of malignant neoplasm of ovary   . Intractable nausea and vomiting 11/11/2013  . Septate uterus 09/08/2013  . Diabetic gastroparesis ([place]) 04/28/2013  . Constipation due to pain medication 04/21/2013  . Odynophagia 04/04/2013  . Liver masses 02/28/2013  . Reflux esophagitis 02/26/2013  . Morbid obesity ([place]) 10/10/2012  . Chronic diastolic CHF (congestive heart failure) ([place]) 10/09/2012  . Chronic respiratory failure with hypoxia ([place]) 10/09/2012  . Clinical polymyositis syndrome ([place]) 09/06/2012  . Mononeuritis of lower limb 01/12/2012  . HTN (hypertension) 11/08/2011  . Systolic CHF, acute on chronic ([place]) 11/08/2011  . HLD (hyperlipidemia)  07/19/2011  . Bell's palsy 08/09/2010  . SLE 08/09/2010  . [place] SYNDROME 08/09/2010    Expected Discharge Date: Expected Discharge Date: 09/12/19  Team Members Present: Physician leading conference: Dr. Alger Simons Social Worker Present: Lennart Pall, [place], [place]) Nurse Present: Isla Pence, RN Case Manager: Karene Fry, RN PT Present: Burnard Bunting, PT OT Present: Laverle Hobby, OT SLP Present: Weston Anna, SLP PPS Coordinator present : Ileana Ladd, Burna Mortimer, SLP     Current Status/Progress Goal Weekly Team Focus  Bowel/Bladder   Patient is contient of bowel and bladder  Maintain continence  Assist with toileting every time patient calls/PRN   Swallow/Nutrition/ Hydration             ADL's   UB bathing/dressing min A, LB bathing/dressing mod A/max A, bed mobility min/mod A.  SPT and short distance amb with RW CGA.  standing tolerance 1 minute  increase UB adl to set up/sup, LB adl min A, functional transfers and ambulation to CS, standing tolerance 5 minutes  adl/assistive device training, transfer tranining, balance and endurance activities, family education   Mobility   Min A-supervision bed mobility, CGA-supervision transfers and gait with RW >80 ft, min A 3-6" steps with B rails  Supervision-mod I overall, CGA 8 steps with B rails for home access  Activity tolerance, balance, gait and stair training, functional mobility, strengthening, patient/caregiver education   Communication             Safety/Cognition/ Behavioral Observations            Pain   Patient rates pain  7 out of 10 to lower abdomen and 4 out of 10 to bilat knees  Pain 3 or less  Provide scheduled and/or PRN pain meds, Assess pain every shift/PRN   Skin   Patient has stage 2 to buttocks, MASD under breast and abdomen folds, and dry skin to BLE  Prevent further skin breakdown  Assess skin every shift, maintain interdry to skin folds as ordered    Rehab Goals Patient on target  to meet rehab goals: Yes *See Care Plan and progress notes for long and short-term goals.     Barriers to Discharge  Current Status/Progress Possible Resolutions Date Resolved   Nursing                  PT  Home environment access/layout  8 STE home with B rails, caregivers able to provide 24/7 CGA-supervision assist at home  Can performed 3 steps with B rails with min A, requires min A-CGA for mobility           OT                  SLP                SW                Discharge Planning/Teaching Needs:  Pt to return home with friend who, along with family, will provide 24/7 assistance.  Teaching needs TBD   Team Discussion: Meds adjusted, scalp wound improving, R leg pain/foot drop, sleeping better.  RN wound drsg change BID.  OT min LB ADL, S UB ADL, min/mod transfers, goals min/S, upgrade to S, Mod I toileting goal.  PT min A standing, stand pivot PFW min A, hopped forward/backwards, needs to be mod I toileting, goals S.   Revisions to Treatment Plan: N/A     Medical Summary Current Status: LBM 2/1- continent B/B; moves pretty well; very lethargic per nursing; likely fear per therapy. lightheaded with walking yesterday- intermittent Weekly Focus/Goal: very fearful; trerrified of shower- did great; walked back to w/c- fear of falling; min assist LB bathing, S/U UB, mod assist LB dressing; CGA for transfers  Barriers to Discharge: Weight;Other (comments);Wound care;Behavior;Decreased family/caregiver support;Home enviroment access/layout;Medical stability  Barriers to Discharge Comments: bariatric bed-  WOC consult placed; 8 steps to enter home Possible Resolutions to Barriers: CGA for transfers; a little of assistance getting into bed; otherwise Supervision; using bariatric RW; wait no rollator for now; 50 ft RW (>80 ft x1); CGA-min assist   Continued Need for Acute Rehabilitation Level of Care: The patient requires daily medical management by a physician with specialized training  in physical medicine and rehabilitation for the following reasons: Direction of a multidisciplinary physical rehabilitation program to maximize functional independence : Yes Medical management of patient stability for increased activity during participation in an intensive rehabilitation regime.: Yes Analysis of laboratory values and/or radiology reports with any subsequent need for medication adjustment and/or medical intervention. : Yes   I attest that I was present, lead the team conference, and concur with the assessment and plan of the team.   Jodell Cipro M 09/03/2019, 6:56 PM   Team conference was held via web/ teleconference due to [place] - 19

## 2019-09-03 NOTE — Progress Notes (Signed)
[facility] PHYSICAL MEDICINE & REHABILITATION PROGRESS NOTE   Subjective/Complaints:  Pt reports no issues initially except stomach pain is doing better overall. Says it's hurting, but better than it has been.   Pt also c/o pain underneath breasts due to moisture    ROS; pt denies SOB, CP, endorses abd pain; denies N/V/D/C; denies vision changes  Objective:   No results found. Recent Labs    09/02/19 0602  WBC 7.1  HGB 7.9*  HCT 25.0*  PLT 383   Recent Labs    09/02/19 0602  NA 138  K 4.3  CL 107  CO2 21*  GLUCOSE 93  BUN 21*  CREATININE 1.40*  CALCIUM 8.7*    Intake/Output Summary (Last 24 hours) at 09/03/2019 0916 Last data filed at 09/03/2019 0900 Gross per 24 hour  Intake 1183 ml  Output 250 ml  Net 933 ml     Physical Exam: Vital Signs Blood pressure 133/68, pulse 75, temperature 98.1 F (36.7 C), resp. rate 18, weight (!) 194.4 kg, last menstrual period 03/24/2017, SpO2 99 %.  Morbidly obese female with BMI of 64+ laying supine in bed; covered by sheet to chin today, wearing 2L O2 by [place], NAD HENT:  Head: Normocephalic and atraumatic.  Nose: Nose normal.  in place.  Mouth/Throat: Oropharynx is clear and moist.  Eyes:  Conjunctivae and EOM are normal.  Neck: No tracheal deviation present.  Cardiovascular: Normal rate and regular rhythm.  Respiratory:CTA B/L, however decreased breath sounds, esp at bases- due to hypoventilation syndrome GI: Soft. Bowel sounds are normal. There is no rebound and no guarding.  Soft, protuberant, mildly TTP diffusely; ND, (+) hypoactive BS  Musculoskeletal:        General: No tenderness or edema.     Cervical back: Normal range of motion and neck supple.     Comments: B/L UEs- deltoi 4-/5, biceps 4/5, triceps 4/5, WE 4/5, grip 4/5, finger abd 4-/5 LEs- HF 2+/5, KE 2+/5, DF/PF 4-/5 Very large LEs- hard to move for examiner or pt.   Neurological:Sensation intact in all 4 extremities to light touch Skin: Skin is warm and  extremely dry, esp on arms Under breast and between breasts, skin is actually splitting and open, and silver cloth was bunched up and appeared to be contributing to splitting of skin underneath breasts- R worse than L, but also between breasts.  1 IV in L forearm- no signs of infiltration  Psychiatric:  Slow to process/respond Flat affect   Assessment/Plan: 1. Functional deficits secondary to debility due to liver bleeding adenoma and DKA/AKI which require 3+ hours per day of interdisciplinary therapy in a comprehensive inpatient rehab setting.  Physiatrist is providing close team supervision and 24 hour management of active medical problems listed below.  Physiatrist and rehab team continue to assess barriers to discharge/monitor patient progress toward functional and medical goals  Care Tool:  Bathing    Body parts bathed by patient: Right arm, Left arm, Chest, Abdomen, Front perineal area, Face   Body parts bathed by helper: Right arm, Left arm, Front perineal area, Buttocks, Right upper leg, Left upper leg, Right lower leg, Left lower leg     Bathing assist Assist Level: Maximal Assistance - Patient 24 - 49%     Upper Body Dressing/Undressing Upper body dressing   What is the patient wearing?: Pull over shirt    Upper body assist Assist Level: Moderate Assistance - Patient 50 - 74%    Lower Body Dressing/Undressing Lower body  dressing      What is the patient wearing?: Incontinence brief, Pants     Lower body assist Assist for lower body dressing: Moderate Assistance - Patient 50 - 74%     Toileting Toileting    Toileting assist Assist for toileting: Moderate Assistance - Patient 50 - 74%     Transfers Chair/bed transfer  Transfers assist     Chair/bed transfer assist level: Contact Guard/Touching assist Chair/bed transfer assistive device: Programmer, multimedia   Ambulation assist   Ambulation activity did not occur: Safety/medical  concerns(bilateral LE weakness, fatigue, SOB)  Assist level: Supervision/Verbal cueing Assistive device: Walker-rolling Max distance: 52'   Walk 10 feet activity   Assist  Walk 10 feet activity did not occur: Safety/medical concerns(bilateral LE weakness, fatigue, SOB)  Assist level: Supervision/Verbal cueing Assistive device: Walker-rolling   Walk 50 feet activity   Assist Walk 50 feet with 2 turns activity did not occur: Safety/medical concerns(bilateral LE weakness, fatigue, SOB)  Assist level: Supervision/Verbal cueing Assistive device: Walker-rolling    Walk 150 feet activity   Assist Walk 150 feet activity did not occur: Safety/medical concerns(bilateral LE weakness, fatigue, SOB)         Walk 10 feet on uneven surface  activity   Assist Walk 10 feet on uneven surfaces activity did not occur: Safety/medical concerns(bilateral LE weakness, fatigue, SOB)         Wheelchair     Assist Will patient use wheelchair at discharge?: Yes Type of Wheelchair: Manual Wheelchair activity did not occur: Safety/medical concerns(fatigue, SOB, decreased endurance)         Wheelchair 50 feet with 2 turns activity    Assist    Wheelchair 50 feet with 2 turns activity did not occur: Safety/medical concerns(fatigue, SOB, decreased endurance)       Wheelchair 150 feet activity     Assist  Wheelchair 150 feet activity did not occur: Safety/medical concerns(fatigue, SOB, decreased endurance)       Blood pressure 133/68, pulse 75, temperature 98.1 F (36.7 C), resp. rate 18, weight (!) 194.4 kg, last menstrual period 03/24/2017, SpO2 99 %.  1.  Impaired mobility, ADLs and function secondary to debility due to hemrrhagic shock due to bleeding adenoma, AKI, and DKA             -patient may  shower             -ELOS/Goals: 7-10 days/ supervision to min assist 2.  Antithrombotics: -DVT/anticoagulation:  Mechanical: Sequential compression devices, below  knee Bilateral lower extremities  -would benefit from Lovenox- will see if possible since her size and immobility             -antiplatelet therapy: N/A 3. Pain Management: Need to educated/encourage patient to work thorough pain as > 7 days post embolization. Continue MS contin with MSIR   4. Mood: LCSW to follow for evaluation and support.              -antipsychotic agents: N/A 5. Neuropsych: This patient is capable of making decisions on her own behalf. 6. Skin/Wound Care:  Antimicrobial fabric to absorb moisture to assist in management of MASD.  7. Fluids/Electrolytes/Nutrition: Continue Ensure max. Will add prostat for depleted protein stores.  8. Liver adenomas with bleeding s/p embolization: Continue to monitor H/H. 9. OSA/OHS: Continue 2L oxygen per [place]--encourage CPAP at nights. Continue Dulera bid 10. T2DM: Hgb A1c- 8.6--> improving/sees Dr. Loanne Drilling. Continue to monitor BS ac/hs and titrate lantus as indicated--intake poor  at this time. Continue Lantus with SSI  CBG (last 3)  Recent Labs    09/02/19 1645 09/02/19 2143 09/03/19 0549  GLUCAP 163* 123* 114*    2/2- well controlled BGs 11. Acute on chronic renal failure?: SCR improved from 3.10-->1.29  1/29- Cr 1.09 and BUN 20  2/1- Cr bounced back to 1.4- BUN 21- no Lasix/fluid pill on board- will encourage slight increase in fluid intake and recheck Wednesday.  12. Chronic combined systolic CHF: Check weights daily. Monitor for signs of overload. On Entresto, pravastatin, Bidil and coreg.    Filed Weights   08/30/19 0545 2019/09/21 0432 09/03/19 0500  Weight: (!) 195.4 kg (!) 192.3 kg (!) 194.4 kg    1/29- will monitor weights for trend, daily  09-20-2022: Weight significantly down.   1/31: no weight  2/1- no weight  2/2- Weight down by 1 [place] overall 13. H/o Lupus: On Plaquenil bid with prednisone. On Lyrica for pain management.   14. H/o anxiety/depression: On Wellbutrin and cymbalta with Xanax prn. - getting over recent death  of brother.   20-Sep-2022: Likely exacerbated by SOB. On [place] 2L, satting above 96%. 15. Hematuria?/Vaginal bleeding?: Nuvaring out PTA. Reports heavy bleeding off megestrol--?resume.  16. Constipation: Sep 20, 2022: patient states last BM was Monday and she feels constipated. Will order mag citrate. Patient worried about making it to bathroom on time. Advised that bedside commode will be placed next to her prior to administration.   2/1- 3 BMs in last 36 hours- last 1 at 2pm- hopefully feeling better.    17. Denuded skin under breasts- yeast?; placed WOC consult and will follow guidelines.    LOS: 5 days A FACE TO FACE EVALUATION WAS PERFORMED  Abdalla Naramore 09/03/2019, 9:16 AM

## 2019-09-03 NOTE — Patient Care Conference (Signed)
Inpatient RehabilitationTeam Conference and Plan of Care Update Date: 09/03/2019   Time: 11:30 AM    Patient Name: [name]      Medical Record Number: [id number]  Date of Birth: 1970/10/06 Sex: Female         Room/Bed: 4W01C/4W01C-01 Payor Info: Payor: MEDICAID [place] / Plan: MEDICAID [place] ACCESS / Product Type: *No Product type* /    Admit Date/Time:  08/29/2019  6:35 PM  Primary Diagnosis:  Physical debility  Patient Active Problem List   Diagnosis Date Noted  . Pressure injury of skin 08/30/2019  . Acute on chronic combined systolic and diastolic CHF (congestive heart failure) ([place]) 08/30/2019  . Physical debility 08/29/2019  . Obesity hypoventilation syndrome ([place]) 08/23/2019  . Skin breakdown 08/23/2019  . Essential hypertension 08/23/2019  . Liver hemorrhage 08/23/2019  . Acute blood loss anemia 08/13/2019  . Hepatic adenoma   . Hemoperitoneum   . Diabetic acidosis without coma ([place])   . Multifocal pneumonia 09/23/2017  . Acute respiratory failure ([place]) 09/21/2017  . CHF exacerbation ([place]) 09/20/2017  . Costochondritis 06/06/2017  . Chest pain 06/04/2017  . Diabetes ([place]) 09/19/2015  . Radiculopathy of cervical spine   . Severe headache 03/05/2014  . Family history of malignant neoplasm of breast   . Family history of malignant neoplasm of ovary   . Intractable nausea and vomiting 11/11/2013  . Septate uterus 09/08/2013  . Diabetic gastroparesis ([place]) 04/28/2013  . Constipation due to pain medication 04/21/2013  . Odynophagia 04/04/2013  . Liver masses 02/28/2013  . Reflux esophagitis 02/26/2013  . Morbid obesity ([place]) 10/10/2012  . Chronic diastolic CHF (congestive heart failure) ([place]) 10/09/2012  . Chronic respiratory failure with hypoxia ([place]) 10/09/2012  . Clinical polymyositis syndrome ([place]) 09/06/2012  . Mononeuritis of lower limb 01/12/2012  . HTN (hypertension) 11/08/2011  . Systolic CHF, acute on chronic ([place]) 11/08/2011  . HLD (hyperlipidemia)  07/19/2011  . Bell's palsy 08/09/2010  . SLE 08/09/2010  . [place] SYNDROME 08/09/2010    Expected Discharge Date: Expected Discharge Date: 09/13/19  Team Members Present: Physician leading conference: Dr. [name] Social Worker Present: Lennart Pall, LCSW Nurse Present: Dorien Chihuahua, RN; Isla Pence, RN Case manager: Karene Fry, RN PT Present: Apolinar Junes, PT OT Present: Elisabeth Most, OT SLP Present: Stormy Fabian, SLP PPS Coordinator present : Ileana Ladd, Burna Mortimer, SLP     Current Status/Progress Goal Weekly Team Focus  Bowel/Bladder   Patient is contient of bowel and bladder  Maintain continence  Assist with toileting every time patient calls/PRN   Swallow/Nutrition/ Hydration             ADL's   UB bathing/dressing min A, LB bathing/dressing mod A/max A, bed mobility min/mod A.  SPT and short distance amb with RW CGA.  standing tolerance 1 minute  increase UB adl to set up/sup, LB adl min A, functional transfers and ambulation to CS, standing tolerance 5 minutes  adl/assistive device training, transfer tranining, balance and endurance activities, family education   Mobility   Min A-supervision bed mobility, CGA-supervision transfers and gait with RW >80 ft, min A 3-6" steps with B rails  Supervision-mod I overall, CGA 8 steps with B rails for home access  Activity tolerance, balance, gait and stair training, functional mobility, strengthening, patient/caregiver education   Communication             Safety/Cognition/ Behavioral Observations            Pain   Patient rates  pain 7 out of 10 to lower abdomen and 4 out of 10 to bilat knees  Pain 3 or less  Provide scheduled and/or PRN pain meds, Assess pain every shift/PRN   Skin   Patient has stage 2 to buttocks, MASD under breast and abdomen folds, and dry skin to BLE  Prevent further skin breakdown  Assess skin every shift, maintain interdry to skin folds as ordered    Rehab Goals Patient on  target to meet rehab goals: Yes *See Care Plan and progress notes for long and short-term goals.     Barriers to Discharge  Current Status/Progress Possible Resolutions Date Resolved   Nursing                  PT  Home environment access/layout  8 STE home with B rails, caregivers able to provide 24/7 CGA-supervision assist at home  Can performed 3 steps with B rails with min A, requires min A-CGA for mobility           OT                  SLP                SW                Discharge Planning/Teaching Needs:  Pt to return home with friend who, along with family, will provide 24/7 assistance.  Teaching needs TBD   Team Discussion: BMI almost 65, on O2 FT, skin breakdown under breasts, DM, enc fluids, recheck labs, vaginal bleeding.  RN on lovenox, R breast breakdown, bariatric bed ordered, inc B/B, lethargic.  OT flat affect, showered, did well, fear of falling, min A bathing, set up UB dressing, mod A LB dressing, transfers CGA.  PT CGA transfers, bariatric RW 50-80' on O2 2L, stairs X 3 with 2 rails, has 8 steps at home, S/mod I goals, goal CGA on stairs, lightheaded, falls X2 at home.    Revisions to Treatment Plan: N/A     Medical Summary Current Status: LBM 2/1- continent B/B; moves pretty well; very lethargic per nursing; likely fear per therapy. lightheaded with walking yesterday- intermittent Weekly Focus/Goal: very fearful; trerrified of shower- did great; walked back to w/c- fear of falling; min assist LB bathing, S/U UB, mod assist LB dressing; CGA for transfers  Barriers to Discharge: Weight;Other (comments);Wound care;Behavior;Decreased family/caregiver support;Home enviroment access/layout;Medical stability  Barriers to Discharge Comments: bariatric bed-  WOC consult placed; 8 steps to enter home Possible Resolutions to Barriers: CGA for transfers; a little of assistance getting into bed; otherwise Supervision; using bariatric RW; wait no rollator for now; 50 ft RW (>80  ft x1); CGA-min assist   Continued Need for Acute Rehabilitation Level of Care: The patient requires daily medical management by a physician with specialized training in physical medicine and rehabilitation for the following reasons: Direction of a multidisciplinary physical rehabilitation program to maximize functional independence : Yes Medical management of patient stability for increased activity during participation in an intensive rehabilitation regime.: Yes Analysis of laboratory values and/or radiology reports with any subsequent need for medication adjustment and/or medical intervention. : Yes   I attest that I was present, lead the team conference, and concur with the assessment and plan of the team.   Jodell Cipro M 09/03/2019, 8:26 PM   Team conference was held via web/ teleconference due to [place] - 19

## 2019-09-03 NOTE — Progress Notes (Signed)
Occupational Therapy Session Note  Patient Details  Name: [name] MRN: [id number] Date of Birth: 06/26/1971  Today's Date: 09/03/2019 OT Individual Time: [id number] OT Individual Time Calculation (min): 75 min    Short Term Goals: Week 1:  OT Short Term Goal 1 (Week 1): Patient will complete bed mobility with mod A of one OT Short Term Goal 2 (Week 1): patient will complete sit to stand and SPT with RW min A of one OT Short Term Goal 3 (Week 1): patient will complete UB bathing/dressing with set up OT Short Term Goal 4 (Week 1): patient will complete LB bathing/dressing with mod A and demonstrate good carryover of AD use OT Short Term Goal 5 (Week 1): patient will tolerate standing for grooming tasks at sink for 5 minutes with CGA  Skilled Therapeutic Interventions/Progress Updates:    Patient in bed, alert and ready for therapy.  2L O2 via [place] t/o session.  Offered shower vs sponge bath and she agreed to a shower but stated that she is nervous.  Reviewed process and DME plan for shower.  Supine to SSP with CS.  SPT with RW to commode with CS.    toileting completed CGA level.  SPT to w/c with CGA.  Short distance ambulation to/from w/c & commode in shower stall with CGA.  She is able to complete bathing using hand held shower and long handled sponge with min A for buttocks and bilateral lower legs - min A to dry under breasts.  Max A to place interdry, set up for [facility] shirt, mod A for pants over feet and clothing management in stance.  She is able to complete grooming tasks/oral care in seated position with set up.  She remained sitting in w/c at close of session, call bell and tray table in reach.    Therapy Documentation Precautions:  Precautions Precautions: Fall Precaution Comments: 2L O2 via [place] Restrictions Weight Bearing Restrictions: No General:   Vital Signs: Therapy Vitals Pulse Rate: 75 Resp: 18 Patient Position (if appropriate): Lying Oxygen Therapy SpO2: 98 % O2  Device: Nasal Cannula O2 Flow Rate (L/min): 2 L/min Pain: Pain Assessment Pain Scale: 0-10 Pain Score: 0-No pain Pain Type: Acute pain Pain Location: Breast Pain Radiating Towards: back Pain Descriptors / Indicators: Aching Pain Frequency: Intermittent Pain Onset: Gradual Patients Stated Pain Goal: 3 Pain Intervention(s): Medication (See eMAR) Other Treatments:     Therapy/Group: Individual Therapy  Carlos Levering 09/03/2019, 12:20 PM

## 2019-09-03 NOTE — Consult Note (Signed)
WOC Nurse Consult Note: [place] Nurse wound follow up Wound type:Moisture associated skin damage in the bilateral inframammary areas, specifically intertriginous dermatitis. Condition has progressed to full thickness wound beneath right breast, left breast is responding to InterDry therapeutic textile. Last seen by my partner M. Austin on 08/30/19. Measurement: Right breast:  1.4cm x 12cm with 1cm x 6cm nonviable center (slough). Red, moist Left breast: 0.2cm x 6cm x 0.1cm pink, moist wound bed with evidence of wound healing at lateral edges Wound bed:As described above Drainage (amount, consistency, odor) serous exudate from left breast wound, serous and light yellow drainage from right breast (consistent with autolytically debriding nonviable center) Periwound: Intact, moist from perspiration Dressing procedure/placement/frequency: The InterDry antimicrobial wicking textile is still the most appropriate dressing for the inframammary area; I will add a topical drying agent (calcium alginate strip) daily to the area of nonviable tissue for the right breast wound and continue InterDry. Additional supply of InterDry is provided to the room today.  A mattress replacement to accommodate the bariatric patient is also being provided today.  If you agree, please consider a few days of oral antifungal (e.g., Diflucan) to augment the POC.  [place] nursing team will not follow, but will remain available to this patient, the nursing and medical teams.  Please re-consult if needed. Thanks, Maudie Flakes, MSN, RN, [place], Arther Abbott  Pager# [phone]

## 2019-09-03 NOTE — Progress Notes (Signed)
Physical Therapy Session Note  Patient Details  Name: [name] MRN: [id number] Date of Birth: [birth year]  Today's Date: 09/03/2019 PT Individual Time: [id number] PT Individual Time Calculation (min): 33 min   Short Term Goals: Week 1:  PT Short Term Goal 1 (Week 1): Pt will perform bed mobility mod A PT Short Term Goal 2 (Week 1): Pt will transfer stand<>pivot with LRAD max A PT Short Term Goal 3 (Week 1): Pt will perform car transfer with LRAD max A  Skilled Therapeutic Interventions/Progress Updates:     Patient in w/c on 2L/min O2 in room upon PT arrival. Patient alert and agreeable to PT session. Patient reported 8/10 R trunk pain, indicated just beneath her breast to her low back, during session, RN made aware and provided pain medicine during session. PT provided repositioning, rest breaks, and distraction as pain interventions throughout session. Educated on pain management during session. Discussed patient reports of feeling "light-headed" with therapy yesterday. She stated that it has occurred intermittently at home and cause 2 falls.   Therapeutic Activity: Transfers: Patient performed sit to/from stand x3 from a bariatric w/c and a stand turn transfer using a rollator with CGA-close supervision. Provided verbal cues for reaching back to sit to control descent.  Gait Training:  Patient ambulated 30 feet x2 using a rollator with CGA-min A on first trial and a bariatric RW with supervision on second trial. Ambulated with decreased gait speed, decreased step length and height, increased BOS, forward trunk lean, and downward head gaze. Provided verbal cues for erect posture,staying close to the AD for safety, and paced breathing for increased breath support. Patient demonstrated decreased balance and safety using rollator, educated patient on using the bariatric RW with therapy at this time until balance improves. Patient in agreement.   Patient performed marching in standing x1 min  for endurance and strengthening using a bariatric RW for balance.  Assessed vitals with all activity: Seated: BP 116/62 HR 88 Standing: BP 131/97 HR 102 Walking: BP 135/94 HR 115 Marching: BP 198/75 HR 91 Patient was asymptomatic with all activity. SPO2 >94% on 2L O2 throughout session.   Patient in w/c in room on 2L/min O2 at end of session with breaks locked, chair alarm set, and all needs within reach.    Therapy Documentation Precautions:  Precautions Precautions: Fall Precaution Comments: 2L O2 via [place] Restrictions Weight Bearing Restrictions: No    Therapy/Group: Individual Therapy  Maripaz Mullan L Leonardo Plaia PT, DPT  09/03/2019, 4:16 PM

## 2019-09-03 NOTE — Progress Notes (Signed)
Physical Therapy Session Note  Patient Details  Name: [name] MRN: [id number] Date of Birth: 11-Aug-1970  Today's Date: 09/03/2019 PT Individual Time: [id number] and [id number] PT Individual Time Calculation (min): 42 min and and 43 min   Short Term Goals: Week 1:  PT Short Term Goal 1 (Week 1): Pt will perform bed mobility mod A PT Short Term Goal 2 (Week 1): Pt will transfer stand<>pivot with LRAD max A PT Short Term Goal 3 (Week 1): Pt will perform car transfer with LRAD max A  Skilled Therapeutic Interventions/Progress Updates:    Session 1: Pt seated in w/c upon PT arrival, agreeable to therapy tx and denies pain at rest. Pt on 2L O2/min throughout the session with SpO2 >94% with all activities. Pt transported to the gym in the w/c. Pt ascended/descended 4 steps this session with B rails and CGA, step to pattern with patient reporting some pain in her thighs when stepping down. Pt worked on LE strength and cardiovascular endurance to perform x 5 sit<>stands with RW and supervision, cues for techniques and increased time to complete. Pt worked on standing endurance and LE strength to perform 2 x 10 toe taps on aerobic step with UE support on RW. Pt ambulated x 40 ft with RW and supervision, and cues for breathing techniques. Pt transported back to her room and left with needs in reach and chair alarm set.   Session 2: Pt supine in bed upon PT arrival, agreeable to therapy tx and denies pain. Pt transferred to sitting EOB with supervision and use of bedrails, cues for techniques. Pt performed sit>stand from EOB with supervision and ambulated x 5 ft to w/c with RW and CGA. Pt transported to the gym and maintained on 2L O2/min throughout the session. Pt worked on dynamic standing balance and endurance this session while performing overhead reaching to reach for and toss horseshoes this session x 2 trials with CGA and single UE support on RW when leaning forward to toss. Pt ambulated 2 x 33 ft  this session with RW and CGA working on activity tolerance and endurance with gait, cues for breathing techniques. Pt reports having to use bathroom, transported back to her room. Ambulated from w/c>toilet x 5 ft and then to bed x 10 ft with RW and min assist for balance during turns, continent of bladder, performs clothing management and pericare without assist. Pt transferred to bed, sit>supine mod assist for LE management, and left supine at end of session, needs in reach and chair alarm set.   Therapy Documentation Precautions:  Precautions Precautions: Fall Precaution Comments: 2L O2 via [place] Restrictions Weight Bearing Restrictions: No    Therapy/Group: Individual Therapy  Netta Corrigan, PT, DPT, CSRS 09/03/2019, 8:33 AM

## 2019-09-04 ENCOUNTER — Inpatient Hospital Stay (HOSPITAL_COMMUNITY): Payer: Medicaid Other | Admitting: Physical Therapy

## 2019-09-04 ENCOUNTER — Inpatient Hospital Stay (HOSPITAL_COMMUNITY): Payer: Medicaid Other

## 2019-09-04 ENCOUNTER — Inpatient Hospital Stay (HOSPITAL_COMMUNITY): Payer: Medicaid Other | Admitting: Occupational Therapy

## 2019-09-04 LAB — GLUCOSE, CAPILLARY
Glucose-Capillary: 94 mg/dL (ref 70–99)
Glucose-Capillary: 112 mg/dL — ABNORMAL HIGH (ref 70–99)
Glucose-Capillary: 101 mg/dL — ABNORMAL HIGH (ref 70–99)
Glucose-Capillary: 119 mg/dL — ABNORMAL HIGH (ref 70–99)

## 2019-09-04 MED ORDER — FLUCONAZOLE 100 MG PO TABS
100.0000 mg | ORAL_TABLET | Freq: Every day | ORAL | Status: AC
  Administered 2019-09-04 – 2019-09-09 (×6): 100 mg via ORAL
  Filled 2019-09-04 (×6): qty 1

## 2019-09-04 NOTE — Progress Notes (Signed)
Physical Therapy Session Note  Patient Details  Name: GENETTA AUBUT MRN: [id number] Date of Birth: [birth year]  Today's Date: 09/04/2019 PT Individual Time: 1010-1053 PT Individual Time Calculation (min): 43 min   Short Term Goals: Week 1:  PT Short Term Goal 1 (Week 1): Pt will perform bed mobility mod A PT Short Term Goal 2 (Week 1): Pt will transfer stand<>pivot with LRAD max A PT Short Term Goal 3 (Week 1): Pt will perform car transfer with LRAD max A  Skilled Therapeutic Interventions/Progress Updates:     Patient in bed asleep upon PT arrival. Patient easily aroused and agreeable to PT session. Patient reported 7/10 R trunk and back pain during session, RN made aware. PT provided repositioning, rest breaks, and distraction as pain interventions throughout session. Patient on 2L/min O2 throughout session.   Therapeutic Activity: Bed Mobility: Patient performed supine to/from sit with supervision-min A for LE management in a flat bed without use of bed rails. Provided verbal cues for brining knees to chest for abdominal activation with lifting LEs onto the bed. Transfers: Patient performed sit to/from stand x4 and stand pivot x2 using a bariatric RW with close supervision for safety. Provided verbal cues for reaching back to sit for safety.  Gait Training:  Patient ambulated 4 trials using bariatric RW with CGA for safety/balance. Ambulated with decreased gait speed, decreased step length and height, forward trunk lean, B trunk sway with mild abduction gait, and downward head gaze. Provided verbal cues for erect posture, staying close to the RW, increased step length and height, and focused on increased gait speed with encouragment to maintain max gait speed throughout each trial for increased cardiovascular endurance with functional gait training. Trial 1: 30 feet, SPO2 96% HR 101 Trial 2: 60 feet in 1 min 46 sec, SPO2 95% HR 108 Trial 3: 60 feet in 1 min 10 sec, SPO2 94% HR 105 Trial 4:  75 feet, SPO2 88% HR 113, recovered to SPO2 >93% and HR 99 in 1 min Provided 2-3 min sitting rest breaks between trials with SPO2 >96% and HR <100 bpm before next trial. Provided education on pursed lip breathing, paced breathing, and benefits of cardiovascular exercise and physical activity during rest breaks. Patient was very receptive to education.  Wheelchair Mobility:  Patient was transported in the w/c with total A throughout session for energy conservation and time management.  Patient in bed at end of session with breaks locked, bed alarm set, and all needs within reach.    Therapy Documentation Precautions:  Precautions Precautions: Fall Precaution Comments: 2L O2 via [place] Restrictions Weight Bearing Restrictions: No    Therapy/Group: Individual Therapy  Josue Kass L Molly Savarino PT, DPT  09/04/2019, 5:33 PM

## 2019-09-04 NOTE — Plan of Care (Signed)
  Problem: Consults Goal: RH GENERAL PATIENT EDUCATION Description: See Patient Education module for education specifics. Outcome: Progressing Goal: Skin Care Protocol Initiated - if Braden Score 18 or less Description: If consults are not indicated, leave blank or document N/A Outcome: Progressing Goal: Diabetes Guidelines if Diabetic/Glucose > 140 Description: If diabetic or lab glucose is > 140 mg/dl - Initiate Diabetes/Hyperglycemia Guidelines & Document Interventions  Outcome: Progressing   Problem: RH BLADDER ELIMINATION Goal: RH STG MANAGE BLADDER WITH ASSISTANCE Description: STG Manage Bladder With min/mod Assistance Outcome: Progressing Flowsheets (Taken 09/04/2019 1536) STG: Pt will manage bladder with assistance: 4-Minimal assistance   Problem: RH SKIN INTEGRITY Goal: RH STG SKIN FREE OF INFECTION/BREAKDOWN Description: Pt will be free of skin breakdown prior to DC with min/mod assist Outcome: Progressing Goal: RH STG MAINTAIN SKIN INTEGRITY WITH ASSISTANCE Description: STG Maintain Skin Integrity With min/mod Assistance. Outcome: Progressing Flowsheets (Taken 09/04/2019 1536) STG: Maintain skin integrity with assistance: 4-Minimal assistance Goal: RH STG ABLE TO PERFORM INCISION/WOUND CARE W/ASSISTANCE Description: STG Able To Perform Incision/Wound Care With mod Assistance. Outcome: Progressing Flowsheets (Taken 09/04/2019 1536) STG: Pt will be able to perform incision/wound care with assistance: 2-Maximum assistance   Problem: RH SAFETY Goal: RH STG ADHERE TO SAFETY PRECAUTIONS W/ASSISTANCE/DEVICE Description: STG Adhere to Safety Precautions With cues/reminders Assistance/Device. Outcome: Progressing Flowsheets (Taken 09/04/2019 1536) STG:Pt will adhere to safety precautions with assistance/device: 2-Maximum assistance   Problem: RH PAIN MANAGEMENT Goal: RH STG PAIN MANAGED AT OR BELOW PT'S PAIN GOAL Description: Less than 4 on 0-10 scale Outcome: Progressing

## 2019-09-04 NOTE — Progress Notes (Signed)
Occupational Therapy Session Note  Patient Details  Name: [name] MRN: [id number] Date of Birth: 12-Apr-1971  Today's Date: 09/04/2019 OT Individual Time: [id number] OT Individual Time Calculation (min): 72 min    Short Term Goals: Week 1:  OT Short Term Goal 1 (Week 1): Patient will complete bed mobility with mod A of one OT Short Term Goal 2 (Week 1): patient will complete sit to stand and SPT with RW min A of one OT Short Term Goal 3 (Week 1): patient will complete UB bathing/dressing with set up OT Short Term Goal 4 (Week 1): patient will complete LB bathing/dressing with mod A and demonstrate good carryover of AD use OT Short Term Goal 5 (Week 1): patient will tolerate standing for grooming tasks at sink for 5 minutes with CGA  Skilled Therapeutic Interventions/Progress Updates:    Upon entering the room, pt supine in bed with no c/o pain and agreeable to OT intervention. Pt verbalized need for toileting. Min guard for supine >sit and stand pivot transfer with RW to bari [facility]. Pt able to perform clothing management with increased time but needing assistance for hygiene after voiding. Pt working on turning in circle in tight space with RW to sit into wheelchair. Pt seated for grooming tasks at sink independently. Pt remains on 2 L O2 via [place] throughout session as well and needs frequent rest breaks secondary to fatigue with mobility tasks. OT assisted pt to ADL apartment via wheelchair for energy conservation. Pt ambulating 10' on carpeted surface with RW and onto 27 inch standard bed. Pt needing mod A to manage B LEs onto bed. Pt verbalized she often needed assist with this at home and also that her bed may be more like 30 inches. Plans to practice at this height for safety. Pt ambulating 54' in hallway with RW and min guard as pt fatigues. OT assisted pt back to room via wheelchair secondary to fatigue. Pt declined to remain in wheelchair and requests to return to bed to rest. Min guard stand  pivot transfer and mod A sit >supine. Bed alarm activated and call bell within reach.   Therapy Documentation Precautions:  Precautions Precautions: Fall Precaution Comments: 2L O2 via [place] Restrictions Weight Bearing Restrictions: No General:   Vital Signs: Therapy Vitals Temp: 98.8 F (37.1 C) Pulse Rate: 78 Resp: 20 BP: (!) 143/64 Patient Position (if appropriate): Lying Oxygen Therapy SpO2: 100 % O2 Device: Nasal Cannula Pain: Pain Assessment Pain Scale: 0-10 Pain Score: 6  Faces Pain Scale: No hurt Pain Type: Acute pain Pain Location: Arm Pain Orientation: Right;Left Pain Descriptors / Indicators: Aching Pain Frequency: Occasional Pain Onset: On-going Pain Intervention(s): Medication (See eMAR);Repositioned ADL: ADL Eating: Supervision/safety, Set up Where Assessed-Eating: Bed level Grooming: Minimal assistance Where Assessed-Grooming: Edge of bed Upper Body Bathing: Moderate assistance Where Assessed-Upper Body Bathing: Edge of bed Lower Body Bathing: Dependent Where Assessed-Lower Body Bathing: Edge of bed Upper Body Dressing: Minimal assistance Where Assessed-Upper Body Dressing: Edge of bed Lower Body Dressing: Dependent Where Assessed-Lower Body Dressing: Edge of bed Toileting: Dependent   Therapy/Group: Individual Therapy  Gypsy Decant 09/04/2019, 12:39 PM

## 2019-09-04 NOTE — Progress Notes (Signed)
Social Work Patient ID: [name], female   DOB: 17-May-1971, 49 y.o.   MRN: [id number]  Have reviewed team conference with pt who is aware and agreeable with targeted d/c date of 2/12 and supervision /mod ind goals.  Will arrange fam ed closer to d/c.  [name], LCSW

## 2019-09-04 NOTE — Progress Notes (Signed)
[place] PHYSICAL MEDICINE & REHABILITATION PROGRESS NOTE   Subjective/Complaints:  Pt was asking if there's any other reason that should be concerning that is causing breast skin breakdown- I explained that's it's due to moisture and yeast due to moisture- which causes maceration.   Will add Diflucan for 6 days to help treat yeast- hopefully will improve skin healing. Per WOC recs.  Pt also admits to being sleepy this AM- was placed on bariatric bed, which she appears to have a little more room to turn in.   Pt denies that has any wounds on backside, however chart mentions a Stage II on sacrum that's healing. Will try to assess tomorrow, if nursing can help.  Nurse to assess today.   ROS; pt denies SOB, CP, endorses abd pain; denies N/V/D/C; denies vision changes  Objective:   No results found. Recent Labs    09/02/19 0602  WBC 7.1  HGB 7.9*  HCT 25.0*  PLT 383   Recent Labs    09/02/19 0602  NA 138  K 4.3  CL 107  CO2 21*  GLUCOSE 93  BUN 21*  CREATININE 1.40*  CALCIUM 8.7*    Intake/Output Summary (Last 24 hours) at 09/04/2019 [id number] Last data filed at 09/04/2019 0730 Gross per 24 hour  Intake 600 ml  Output --  Net 600 ml     Physical Exam: Vital Signs Blood pressure (!) 135/55, pulse 81, temperature 99.1 F (37.3 C), resp. rate 20, weight (!) 194.4 kg, last menstrual period 03/24/2017, SpO2 98 %.  Morbidly obese female with BMI of 64+ laying supine in bed; appears more comfortable; in bariatric bed; nurse in room;, wearing 2L O2 by [place], NAD HENT:  Head: Normocephalic and atraumatic.  Nose: Nose normal. [place] in place.  Mouth/Throat: Oropharynx is clear and moist.  Eyes:  Conjunctivae and EOM are normal.  Neck: No tracheal deviation present.  Cardiovascular: Normal rate and regular rhythm.  Respiratory:CTA B/L, however decreased breath sounds, esp at bases- due to hypoventilation syndrome GI: Soft. Bowel sounds are normal. There is no rebound and no  guarding.  Soft, protuberant, mildly TTP diffusely; ND, (+) hypoactive BS  Musculoskeletal:        General: No tenderness or edema.     Cervical back: Normal range of motion and neck supple.     Comments: B/L UEs- deltoi 4-/5, biceps 4/5, triceps 4/5, WE 4/5, grip 4/5, finger abd 4-/5 LEs- HF 2+/5, KE 2+/5, DF/PF 4-/5 Very large LEs- hard to move for examiner or pt.   Neurological:Sensation intact in all 4 extremities to light touch Skin: Skin is warm and extremely dry, esp on arms Under breast and between breasts, skin is actually splitting and open, and silver cloth was bunched up and appeared to be contributing to splitting of skin underneath breasts- R worse than L, but also between breasts. Didn't assess today 1 IV in L forearm- no signs of infiltration  Psychiatric:  Slow to process/respond- more sleepy appearing today.  Flat affect   Assessment/Plan: 1. Functional deficits secondary to debility due to liver bleeding adenoma and DKA/AKI which require 3+ hours per day of interdisciplinary therapy in a comprehensive inpatient rehab setting.  Physiatrist is providing close team supervision and 24 hour management of active medical problems listed below.  Physiatrist and rehab team continue to assess barriers to discharge/monitor patient progress toward functional and medical goals  Care Tool:  Bathing    Body parts bathed by patient: Right arm, Left arm, Chest,  Abdomen, Front perineal area, Face, Right upper leg, Left upper leg, Right lower leg, Left lower leg, Buttocks   Body parts bathed by helper: Buttocks, Right lower leg, Left lower leg     Bathing assist Assist Level: Minimal Assistance - Patient > 75%     Upper Body Dressing/Undressing Upper body dressing   What is the patient wearing?: Pull over shirt    Upper body assist Assist Level: Set up assist    Lower Body Dressing/Undressing Lower body dressing      What is the patient wearing?: Underwear/pull up,  Pants     Lower body assist Assist for lower body dressing: Moderate Assistance - Patient 50 - 74%     Toileting Toileting    Toileting assist Assist for toileting: Contact Guard/Touching assist     Transfers Chair/bed transfer  Transfers assist     Chair/bed transfer assist level: Contact Guard/Touching assist Chair/bed transfer assistive device: Programmer, multimedia   Ambulation assist   Ambulation activity did not occur: Safety/medical concerns(bilateral LE weakness, fatigue, SOB)  Assist level: Supervision/Verbal cueing Assistive device: Walker-rolling Max distance: 30'   Walk 10 feet activity   Assist  Walk 10 feet activity did not occur: Safety/medical concerns(bilateral LE weakness, fatigue, SOB)  Assist level: Supervision/Verbal cueing Assistive device: Walker-rolling   Walk 50 feet activity   Assist Walk 50 feet with 2 turns activity did not occur: Safety/medical concerns(bilateral LE weakness, fatigue, SOB)  Assist level: Supervision/Verbal cueing Assistive device: Walker-rolling    Walk 150 feet activity   Assist Walk 150 feet activity did not occur: Safety/medical concerns(bilateral LE weakness, fatigue, SOB)         Walk 10 feet on uneven surface  activity   Assist Walk 10 feet on uneven surfaces activity did not occur: Safety/medical concerns(bilateral LE weakness, fatigue, SOB)         Wheelchair     Assist Will patient use wheelchair at discharge?: Yes Type of Wheelchair: Manual Wheelchair activity did not occur: Safety/medical concerns(fatigue, SOB, decreased endurance)         Wheelchair 50 feet with 2 turns activity    Assist    Wheelchair 50 feet with 2 turns activity did not occur: Safety/medical concerns(fatigue, SOB, decreased endurance)       Wheelchair 150 feet activity     Assist  Wheelchair 150 feet activity did not occur: Safety/medical concerns(fatigue, SOB, decreased  endurance)       Blood pressure (!) 135/55, pulse 81, temperature 99.1 F (37.3 C), resp. rate 20, weight (!) 194.4 kg, last menstrual period 03/24/2017, SpO2 98 %.  1.  Impaired mobility, ADLs and function secondary to debility due to hemrrhagic shock due to bleeding adenoma, AKI, and DKA             -patient may  shower             -ELOS/Goals: 7-10 days/ supervision to min assist 2.  Antithrombotics: -DVT/anticoagulation:  Mechanical: Sequential compression devices, below knee Bilateral lower extremities  -would benefit from Lovenox- will see if possible since her size and immobility             -antiplatelet therapy: N/A 3. Pain Management: Need to educated/encourage patient to work thorough pain as > 7 days post embolization. Continue MS contin with MSIR   4. Mood: LCSW to follow for evaluation and support.              -antipsychotic agents: N/A 5. Neuropsych: This  patient is capable of making decisions on her own behalf. 6. Skin/Wound Care:  Antimicrobial fabric to absorb moisture to assist in management of MASD.  7. Fluids/Electrolytes/Nutrition: Continue Ensure max. Will add prostat for depleted protein stores.  8. Liver adenomas with bleeding s/p embolization: Continue to monitor H/H. 9. OSA/OHS: Continue 2L oxygen per [employer]--encourage CPAP at nights. Continue Dulera bid 10. T2DM: Hgb A1c- 8.6--> improving/sees Dr. Loanne Drilling. Continue to monitor BS ac/hs and titrate lantus as indicated--intake poor at this time. Continue Lantus with SSI  CBG (last 3)  Recent Labs    09/03/19 1659 09/03/19 2151 09/04/19 0610  GLUCAP 123* 122* 94    2/2- well controlled BGs  2/3- BGs 94-123- well controlled- con't regimen 11. Acute on chronic renal failure?: SCR improved from 3.10-->1.29  1/29- Cr 1.09 and BUN 20  2/1- Cr bounced back to 1.4- BUN 21- no Lasix/fluid pill on board- will encourage slight increase in fluid intake and recheck Wednesday.   2/3- will recheck in AM- esp since starting  Diflucan 12. Chronic combined systolic CHF: Check weights daily. Monitor for signs of overload. On Entresto, pravastatin, Bidil and coreg.    Filed Weights   08/30/19 0545 09/11/2019 0432 09/03/19 0500  Weight: (!) 195.4 kg (!) 192.3 kg (!) 194.4 kg    1/29- will monitor weights for trend, daily  09/10/22: Weight significantly down.   1/31: no weight  2/1- no weight  2/2- Weight down by 1 Kilo overall  2/3- no weight today 13. H/o Lupus: On Plaquenil bid with prednisone. On Lyrica for pain management.   14. H/o anxiety/depression: On Wellbutrin and cymbalta with Xanax prn. - getting over recent death of brother.   09/10/2022: Likely exacerbated by SOB. On [place] 2L, satting above 96%. 15. Hematuria?/Vaginal bleeding?: Nuvaring out PTA. Reports heavy bleeding off megestrol--?resume.  16. Constipation: 09-10-2022: patient states last BM was Monday and she feels constipated. Will order mag citrate. Patient worried about making it to bathroom on time. Advised that bedside commode will be placed next to her prior to administration.   2/1- 3 BMs in last 36 hours- last 1 at 2pm- hopefully feeling better.    17. Denuded skin under breasts- yeast?; placed WOC consult and will follow guidelines.    2/3- will add Diflucan 100 mg daily x 6 days for yeast adding to problems with yeast underneath breasts.   LOS: 6 days A FACE TO FACE EVALUATION WAS PERFORMED  Rainee Sweatt 09/04/2019, 9:28 AM

## 2019-09-04 NOTE — Progress Notes (Signed)
Physical Therapy Session Note  Patient Details  Name: [name] MRN: [id number] Date of Birth: 11-17-1970    Today's Date: 09/04/2019 PT Individual Time: 6004-5997 PT Individual Time Calculation (min): 40 min   Short Term Goals: Week 1:  PT Short Term Goal 1 (Week 1): Pt will perform bed mobility mod A PT Short Term Goal 2 (Week 1): Pt will transfer stand<>pivot with LRAD max A PT Short Term Goal 3 (Week 1): Pt will perform car transfer with LRAD max A   Skilled Therapeutic Interventions/Progress Updates:   Pt received supine in bed and agreeable to PT at bed level due to severe fatigue following previous OT and PT treatment sessions. Supine therex:  SAQ, AROM Heel slides AROM Hip abduction AROM Clam shells, manual resistance SLR with AAROM Hip/knee flexion extension manual resistance into extension Ankle PF/DF with level 1 tband Chest press with 4lb bar weight  Bicep curl x 4LB bar weight  Shoulder press with 4lb bar weight  Mid row with level 2 tband   Each exercise completed 2 x 12 with cues for full ROM, decreased speed of eccentric movement, decreased compensations through trunk. Pt requesting to stay in bed at of session, with call bell in reach, and all needs met.        Therapy Documentation Precautions:  Precautions Precautions: Fall Precaution Comments: 2L O2 via [place] Restrictions Weight Bearing Restrictions: No Vital Signs: Therapy Vitals Temp: 98.8 F (37.1 C) Temp Source: Oral Pulse Rate: 78 Resp: 20 BP: (!) 143/64 Patient Position (if appropriate): Lying Oxygen Therapy SpO2: 100 % O2 Device: Nasal Cannula Pain: Pain Assessment Pain Scale: 0-10 Pain Score: 6  Faces Pain Scale: No hurt Pain Type: Acute pain Pain Location: Arm Pain Orientation: Right;Left Pain Descriptors / Indicators: Aching Pain Frequency: Occasional Pain Onset: On-going Pain Intervention(s): Medication (See eMAR);Repositioned    Therapy/Group: Individual  Therapy  Lorie Phenix 09/04/2019, 2:52 PM

## 2019-09-05 ENCOUNTER — Inpatient Hospital Stay (HOSPITAL_COMMUNITY): Payer: Medicaid Other

## 2019-09-05 ENCOUNTER — Inpatient Hospital Stay (HOSPITAL_COMMUNITY): Payer: Medicaid Other | Admitting: Occupational Therapy

## 2019-09-05 LAB — BASIC METABOLIC PANEL
Anion gap: 8 (ref 5–15)
BUN: 23 mg/dL — ABNORMAL HIGH (ref 6–20)
CO2: 23 mmol/L (ref 22–32)
Calcium: 8.9 mg/dL (ref 8.9–10.3)
Chloride: 110 mmol/L (ref 98–111)
Creatinine, Ser: 1.5 mg/dL — ABNORMAL HIGH (ref 0.44–1.00)
GFR calc Af Amer: 47 mL/min — ABNORMAL LOW (ref >60)
GFR calc non Af Amer: 40 mL/min — ABNORMAL LOW (ref >60)
Glucose, Bld: 95 mg/dL (ref 70–99)
Potassium: 4.2 mmol/L (ref 3.5–5.1)
Sodium: 141 mmol/L (ref 135–145)

## 2019-09-05 LAB — CBC WITH DIFFERENTIAL/PLATELET
Abs Immature Granulocytes: 0.07 10*3/uL (ref 0.00–0.07)
Basophils Absolute: 0 10*3/uL (ref 0.0–0.1)
Basophils Relative: 1 %
Eosinophils Absolute: 0.6 10*3/uL — ABNORMAL HIGH (ref 0.0–0.5)
Eosinophils Relative: 9 %
HCT: 23.5 % — ABNORMAL LOW (ref 36.0–46.0)
Hemoglobin: 7.5 g/dL — ABNORMAL LOW (ref 12.0–15.0)
Immature Granulocytes: 1 %
Lymphocytes Relative: 33 %
Lymphs Abs: 2.3 10*3/uL (ref 0.7–4.0)
MCH: 28.7 pg (ref 26.0–34.0)
MCHC: 31.9 g/dL (ref 30.0–36.0)
MCV: 90 fL (ref 80.0–100.0)
Monocytes Absolute: 0.8 10*3/uL (ref 0.1–1.0)
Monocytes Relative: 12 %
Neutro Abs: 3.2 10*3/uL (ref 1.7–7.7)
Neutrophils Relative %: 44 %
Platelets: 388 10*3/uL (ref 150–400)
RBC: 2.61 MIL/uL — ABNORMAL LOW (ref 3.87–5.11)
RDW: 14.8 % (ref 11.5–15.5)
WBC: 6.9 10*3/uL (ref 4.0–10.5)
nRBC: 0 % (ref 0.0–0.2)

## 2019-09-05 LAB — GLUCOSE, CAPILLARY
Glucose-Capillary: 85 mg/dL (ref 70–99)
Glucose-Capillary: 135 mg/dL — ABNORMAL HIGH (ref 70–99)
Glucose-Capillary: 94 mg/dL (ref 70–99)
Glucose-Capillary: 87 mg/dL (ref 70–99)
Glucose-Capillary: 98 mg/dL (ref 70–99)

## 2019-09-05 MED ORDER — SODIUM CHLORIDE 0.9 % IV SOLN: INTRAVENOUS | Status: DC

## 2019-09-05 MED ORDER — TRAZODONE HCL 50 MG PO TABS
50.0000 mg | ORAL_TABLET | Freq: Every day | ORAL | Status: DC
  Administered 2019-09-05 – 2019-09-17 (×12): 50 mg via ORAL
  Filled 2019-09-05 (×12): qty 1

## 2019-09-05 NOTE — Progress Notes (Signed)
Occupational Therapy Session Note  Patient Details  Name: [name] MRN: [id number] Date of Birth: June 25, 1971  Today's Date: 09/05/2019 OT Individual Time: 1300-1415 OT Individual Time Calculation (min): 75 min    Short Term Goals: Week 1:  OT Short Term Goal 1 (Week 1): Patient will complete bed mobility with mod A of one OT Short Term Goal 2 (Week 1): patient will complete sit to stand and SPT with RW min A of one OT Short Term Goal 3 (Week 1): patient will complete UB bathing/dressing with set up OT Short Term Goal 4 (Week 1): patient will complete LB bathing/dressing with mod A and demonstrate good carryover of AD use OT Short Term Goal 5 (Week 1): patient will tolerate standing for grooming tasks at sink for 5 minutes with CGA  Skilled Therapeutic Interventions/Progress Updates:    Patient in bed, states that she is tired but willing to participate.  2L O2 via [place] t/o session.  Supine to sitting with CS.  Sit to stand and short distance ambulation with RW CS.  Completed UB ergometer in stance with CS for 1.5 minutes.  Completed ergometer in seated position 2 x 5 minutes, sit to stand CS - completed bean bag toss activities x4 trials - able to reach off of base of support and throw effectively to reach target with standing tolerance 1-2 minutes for each trial.  Completed LB kinetron stepping activity for 5 minutes with good tolerance.  Short distance ambulation back to bed at close of session with CS, changed O2 to wall unit.  Mod A for legs into bed.  Bed alarm not working properly - nursing aware.  Call bell in hand and tray table in reach.    Therapy Documentation Precautions:  Precautions Precautions: Fall Precaution Comments: 2L O2 via [place] Restrictions Weight Bearing Restrictions: No General:   Vital Signs: Therapy Vitals Temp: 99.4 F (37.4 C) Pulse Rate: 81 Resp: 18 BP: 107/65 Patient Position (if appropriate): Lying Oxygen Therapy SpO2: 94 % O2 Device: Nasal  Cannula Pain: Pain Assessment Pain Scale: 0-10 Pain Score: 0-No pain Other Treatments:     Therapy/Group: Individual Therapy  Carlos Levering 09/05/2019, 3:52 PM

## 2019-09-05 NOTE — Progress Notes (Signed)
[place] PHYSICAL MEDICINE & REHABILITATION PROGRESS NOTE   Subjective/Complaints:   Pt reports eating OK_ not sleeping great- will try scheduling Trazodone 50 mg QHS and can always titrate up if needed.   Asking if breast wounds will close? I explained they will, but they need to heal, which will take awhile.    ROS; pt denies SOB, CP, endorses abd pain; denies N/V/D/C; denies vision changes  Objective:   No results found. Recent Labs    09/05/19 0551  WBC 6.9  HGB 7.5*  HCT 23.5*  PLT 388   Recent Labs    09/05/19 0551  NA 141  K 4.2  CL 110  CO2 23  GLUCOSE 95  BUN 23*  CREATININE 1.50*  CALCIUM 8.9    Intake/Output Summary (Last 24 hours) at 09/05/2019 0915 Last data filed at 09/05/2019 0840 Gross per 24 hour  Intake 480 ml  Output --  Net 480 ml     Physical Exam: Vital Signs Blood pressure 139/64, pulse 88, temperature 98.3 F (36.8 C), resp. rate 18, weight (!) 194.4 kg, last menstrual period 03/24/2017, SpO2 98 %.  Morbidly obese female with BMI of 65; appears more comfortable; sitting on BSC trying to have BM; did void some; , wearing 2L O2 by [place], NAD HENT:  Head: Normocephalic and atraumatic.  Nose: Nose normal. [place] in place.  Mouth/Throat: Oropharynx is clear and moist.  Eyes:  Conjunctivae and EOM are normal.  Neck: No tracheal deviation present.  Cardiovascular: Normal rate and regular rhythm.  Respiratory:CTA B/L, however decreased breath sounds, esp at bases- due to hypoventilation syndrome GI: Soft. Bowel sounds are normal. There is no rebound and no guarding.  Soft, protuberant, mildly TTP diffusely; ND, (+) hypoactive BS  Musculoskeletal:        General: No tenderness or edema.     Cervical back: Normal range of motion and neck supple.     Comments: B/L UEs- deltoi 4-/5, biceps 4/5, triceps 4/5, WE 4/5, grip 4/5, finger abd 4-/5 LEs- HF 2+/5, KE 2+/5, DF/PF 4-/5 Very large LEs- hard to move for examiner or pt.   Neurological:Sensation  intact in all 4 extremities to light touch Skin: Skin is warm and extremely dry, esp on arms A little healing seen under L and between breasts under skin fold- smaller openings- under R breast, no significant change- still open/skin split with yeast notable.  1 IV in L forearm- no signs of infiltration  Psychiatric:  Slow to process/respond- asking more questions today    Assessment/Plan: 1. Functional deficits secondary to debility due to liver bleeding adenoma and DKA/AKI which require 3+ hours per day of interdisciplinary therapy in a comprehensive inpatient rehab setting.  Physiatrist is providing close team supervision and 24 hour management of active medical problems listed below.  Physiatrist and rehab team continue to assess barriers to discharge/monitor patient progress toward functional and medical goals  Care Tool:  Bathing    Body parts bathed by patient: Right arm, Left arm, Chest, Abdomen, Front perineal area, Face, Right upper leg, Left upper leg, Right lower leg, Left lower leg, Buttocks   Body parts bathed by helper: Buttocks, Right lower leg, Left lower leg     Bathing assist Assist Level: Minimal Assistance - Patient > 75%     Upper Body Dressing/Undressing Upper body dressing   What is the patient wearing?: Pull over shirt    Upper body assist Assist Level: Set up assist    Lower Body Dressing/Undressing  Lower body dressing      What is the patient wearing?: Underwear/pull up, Pants     Lower body assist Assist for lower body dressing: Moderate Assistance - Patient 50 - 74%     Toileting Toileting    Toileting assist Assist for toileting: Contact Guard/Touching assist     Transfers Chair/bed transfer  Transfers assist     Chair/bed transfer assist level: Contact Guard/Touching assist Chair/bed transfer assistive device: Programmer, multimedia   Ambulation assist   Ambulation activity did not occur: Safety/medical  concerns(bilateral LE weakness, fatigue, SOB)  Assist level: Contact Guard/Touching assist Assistive device: Walker-rolling Max distance: 54'   Walk 10 feet activity   Assist  Walk 10 feet activity did not occur: Safety/medical concerns(bilateral LE weakness, fatigue, SOB)  Assist level: Contact Guard/Touching assist Assistive device: Walker-rolling   Walk 50 feet activity   Assist Walk 50 feet with 2 turns activity did not occur: Safety/medical concerns(bilateral LE weakness, fatigue, SOB)  Assist level: Contact Guard/Touching assist Assistive device: Walker-rolling    Walk 150 feet activity   Assist Walk 150 feet activity did not occur: Safety/medical concerns(bilateral LE weakness, fatigue, SOB)         Walk 10 feet on uneven surface  activity   Assist Walk 10 feet on uneven surfaces activity did not occur: Safety/medical concerns(bilateral LE weakness, fatigue, SOB)         Wheelchair     Assist Will patient use wheelchair at discharge?: Yes Type of Wheelchair: Manual Wheelchair activity did not occur: Safety/medical concerns(fatigue, SOB, decreased endurance)         Wheelchair 50 feet with 2 turns activity    Assist    Wheelchair 50 feet with 2 turns activity did not occur: Safety/medical concerns(fatigue, SOB, decreased endurance)       Wheelchair 150 feet activity     Assist  Wheelchair 150 feet activity did not occur: Safety/medical concerns(fatigue, SOB, decreased endurance)       Blood pressure 139/64, pulse 88, temperature 98.3 F (36.8 C), resp. rate 18, weight (!) 194.4 kg, last menstrual period 03/24/2017, SpO2 98 %.  1.  Impaired mobility, ADLs and function secondary to debility due to hemrrhagic shock due to bleeding adenoma, AKI, and DKA             -patient may  shower             -ELOS/Goals: 7-10 days/ supervision to min assist 2.  Antithrombotics: -DVT/anticoagulation:  Mechanical: Sequential compression  devices, below knee Bilateral lower extremities  -would benefit from Lovenox- will see if possible since her size and immobility             -antiplatelet therapy: N/A 3. Pain Management: Need to educated/encourage patient to work thorough pain as > 7 days post embolization. Continue MS contin with MSIR   4. Mood: LCSW to follow for evaluation and support.              -antipsychotic agents: N/A 5. Neuropsych: This patient is capable of making decisions on her own behalf. 6. Skin/Wound Care:  Antimicrobial fabric to absorb moisture to assist in management of MASD.  7. Fluids/Electrolytes/Nutrition: Continue Ensure max. Will add prostat for depleted protein stores.  8. Liver adenomas with bleeding s/p embolization: Continue to monitor H/H. 9. OSA/OHS: Continue 2L oxygen per [place]--encourage CPAP at nights. Continue Dulera bid 10. T2DM: Hgb A1c- 8.6--> improving/sees Dr. Loanne Drilling. Continue to monitor BS ac/hs and titrate lantus as indicated--intake  poor at this time. Continue Lantus with SSI  CBG (last 3)  Recent Labs    09/04/19 2123 09/05/19 0622 09/05/19 0741  GLUCAP 119* 85 135*    2/2- well controlled BGs  2/4- [id number]- will con't regimen 11. Acute on chronic renal failure?: SCR improved from 3.10-->1.29  1/29- Cr 1.09 and BUN 20  2/1- Cr bounced back to 1.4- BUN 21- no Lasix/fluid pill on board- will encourage slight increase in fluid intake and recheck Wednesday.   2/3- will recheck in AM- esp since starting Diflucan  2/4- will give some IVFs for Cr of 1.5 and BUN of 23- will give NS 75cc/hour x 1L due to elevated kidney function and recheck in AM.  12. Chronic combined systolic CHF: Check weights daily. Monitor for signs of overload. On Entresto, pravastatin, Bidil and coreg.    Filed Weights   08/30/19 0545 09/16/2019 0432 09/03/19 0500  Weight: (!) 195.4 kg (!) 192.3 kg (!) 194.4 kg    1/29- will monitor weights for trend, daily  September 15, 2022: Weight significantly down.   1/31: no  weight  2/1- no weight  2/2- Weight down by 1 Kilo overall  2/4- no weight today 13. H/o Lupus: On Plaquenil bid with prednisone. On Lyrica for pain management.   14. H/o anxiety/depression: On Wellbutrin and cymbalta with Xanax prn. - getting over recent death of brother.   09-15-22: Likely exacerbated by SOB. On [place] 2L, satting above 96%. 15. Hematuria?/Vaginal bleeding?: Nuvaring out PTA. Reports heavy bleeding off megestrol--?resume.  16. Constipation: 09-15-2022: patient states last BM was Monday and she feels constipated. Will order mag citrate. Patient worried about making it to bathroom on time. Advised that bedside commode will be placed next to her prior to administration.   2/1- 3 BMs in last 36 hours- last 1 at 2pm- hopefully feeling better.    17. Denuded skin under breasts- yeast?; placed WOC consult and will follow guidelines.    2/3- will add Diflucan 100 mg daily x 6 days for yeast adding to problems with yeast underneath breasts.   2/4- wounds are healing somewhat/so far.   LOS: 7 days A FACE TO FACE EVALUATION WAS PERFORMED  Markel Mergenthaler 09/05/2019, 9:15 AM

## 2019-09-05 NOTE — Progress Notes (Signed)
Physical Therapy Session Note  Patient Details  Name: [name] MRN: [id number] Date of Birth: 01-02-1971  Today's Date: 09/05/2019 PT Individual Time: [id number] PT Individual Time Calculation (min): 63 min   Short Term Goals: Week 1:  PT Short Term Goal 1 (Week 1): Pt will perform bed mobility mod A PT Short Term Goal 2 (Week 1): Pt will transfer stand<>pivot with LRAD max A PT Short Term Goal 3 (Week 1): Pt will perform car transfer with LRAD max A  Skilled Therapeutic Interventions/Progress Updates:    Performed supine to sit with use of bedrail for support with overall CGA for safety due to air mattress. Transferred via RW to w/c with overall supervision with safe technique and good hand placement. Assist for O2 tubing management. Stair negotiation training for home entry practice and overall endurance/strengthening including short distance gait with RW to and from the stairs. Pt performed stairs x 4 with bilateral rails with CGA to min assist and slow step-to pattern with rest breaks due to decreased endurance and increased work of breathing. Gait training with RW x 60' for functional mobility training and muscular endurance with supervision and slow cadence. Dynamic standing balance activity while performing functional reaching task with RW for support of 1 UE x 2 trials of pitching horseshoes with forward lean. Maintains balance with CGA to supervision overall. Required seated rest break due to fatigue. Pt's vitals throughout session WFL; O2 on 2 L via [place] > 93% and HR = 108-116 bpm. End of session request to return back to bed with close supervision and mod assist to return to supine for BLE management back into bed.   Therapy Documentation Precautions:  Precautions Precautions: Fall Precaution Comments: 2L O2 via [place] Restrictions Weight Bearing Restrictions: No    Pain:  Reports she was having pain near her breast and had received morphine earlier this morning. She reports feeling  still loopy and drowsy.     Therapy/Group: Individual Therapy  Canary Brim Ivory Broad, PT, DPT, CBIS  09/05/2019, 12:14 PM

## 2019-09-05 NOTE — Progress Notes (Signed)
Pt stated she does not want to be placed on cpap tonight. RT will continue to monitor as needed.

## 2019-09-05 NOTE — Plan of Care (Signed)
  Problem: Consults Goal: RH GENERAL PATIENT EDUCATION Description: See Patient Education module for education specifics. Outcome: Progressing Goal: Skin Care Protocol Initiated - if Braden Score 18 or less Description: If consults are not indicated, leave blank or document N/A Outcome: Progressing Goal: Diabetes Guidelines if Diabetic/Glucose > 140 Description: If diabetic or lab glucose is > 140 mg/dl - Initiate Diabetes/Hyperglycemia Guidelines & Document Interventions  Outcome: Progressing   Problem: RH BLADDER ELIMINATION Goal: RH STG MANAGE BLADDER WITH ASSISTANCE Description: STG Manage Bladder With min/mod Assistance Outcome: Progressing   Problem: RH SKIN INTEGRITY Goal: RH STG SKIN FREE OF INFECTION/BREAKDOWN Description: Pt will be free of skin breakdown prior to DC with min/mod assist Outcome: Progressing Goal: RH STG MAINTAIN SKIN INTEGRITY WITH ASSISTANCE Description: STG Maintain Skin Integrity With min/mod Assistance. Outcome: Progressing Goal: RH STG ABLE TO PERFORM INCISION/WOUND CARE W/ASSISTANCE Description: STG Able To Perform Incision/Wound Care With mod Assistance. Outcome: Progressing   Problem: RH SAFETY Goal: RH STG ADHERE TO SAFETY PRECAUTIONS W/ASSISTANCE/DEVICE Description: STG Adhere to Safety Precautions With cues/reminders Assistance/Device. Outcome: Progressing   Problem: RH PAIN MANAGEMENT Goal: RH STG PAIN MANAGED AT OR BELOW PT'S PAIN GOAL Description: Less than 4 on 0-10 scale Outcome: Progressing

## 2019-09-05 NOTE — Progress Notes (Signed)
Physical Therapy Weekly Progress Note  Patient Details  Name: [name] MRN: [id number] Date of Birth: [birth year]  Beginning of progress report period: August 30, 2019 End of progress report period: September 05, 2019  Today's Date: 09/05/2019 PT Individual Time: 0920-1020 PT Individual Time Calculation (min): 60 min   Patient has met 3 of 3 short term goals.  Patient is progressing well with PT. She currently requires min A for bed mobility for liftng LEs into the bed, supervision for transfers using a RW, CGA-close supervision gait 60 ft with a RW, and CGA 4-6" steps with B rails.   Patient continues to demonstrate the following deficits muscle weakness, decreased cardiorespiratoy endurance and decreased oxygen support and decreased sitting balance, decreased standing balance, decreased postural control and decreased balance strategies and therefore will continue to benefit from skilled PT intervention to increase functional independence with mobility.  Patient progressing toward long term goals..  Continue plan of care.  PT Short Term Goals Week 1:  PT Short Term Goal 1 (Week 1): Pt will perform bed mobility mod A PT Short Term Goal 2 (Week 1): Pt will transfer stand<>pivot with LRAD max A PT Short Term Goal 3 (Week 1): Pt will perform car transfer with LRAD max A Week 2:  PT Short Term Goal 1 (Week 2): STG=LTG due to ELOS.  Skilled Therapeutic Interventions/Progress Updates:     Patient in bed asleep upon PT arrival. Patient easily aroused and agreeable to PT session. Patient reported 10/10 R trunk pain during session, reported that she was pre-medicated prior to session. PT provided repositioning, rest breaks, and distraction as pain interventions throughout session.   Therapeutic Activity: Bed Mobility: Patient performed supine to sit with supervision and sit to supine with min A for LE managment. Provided verbal cues for bringing knees to chest to lift LEs into the  bed. Transfers: Patient performed sit to/from stand x2 and stand pivot bed>w/c, w/c>BSC, and BSC>bed with CGA-supervision using a bariatric RW. Provided verbal cues for reaching back to sit for safety. Patient was continent of bladder on [facility] and required supervision for peri-care and LB dressing during toileting. Patient performed a simulated Jeep height car transfer with min A for LE management using bariatric RW. Provided cues for safe technique.  Gait Training:  Patient ambulated 45 feet using bariatric RW with CGA for safety. Ambulated with decreased gait speed, decreased step length and height, forward trunk lean, B trunk sway with mild abduction gait, and downward head gaze. Provided verbal cues for erect posture, staying close to the RW, and increased step length and height.   Wheelchair Mobility:  Patient was transported in the w/c with total A throughout session for energy conservation and time management.  Patient in bed at end of session with breaks locked, bed alarm set, and all needs within reach. Patient on 2L/min O2 throughout session, O2 sats remained >90%.    Therapy Documentation Precautions:  Precautions Precautions: Fall Precaution Comments: 2L O2 via [place] Restrictions Weight Bearing Restrictions: No General:   Vital Signs: Therapy Vitals Temp: 99.4 F (37.4 C) Pulse Rate: 81 Resp: 18 BP: 107/65 Patient Position (if appropriate): Lying Oxygen Therapy SpO2: 94 % O2 Device: Nasal Cannula Pain: Pain Assessment Pain Scale: 0-10 Pain Score: 0-No pain Vision/Perception     Mobility:   Locomotion :    Trunk/Postural Assessment :    Balance:   Exercises:   Other Treatments:     Therapy/Group: Individual Therapy  Chee Kinslow L Cathi Hazan PT,  DPT  09/05/2019, 5:16 PM

## 2019-09-06 ENCOUNTER — Inpatient Hospital Stay (HOSPITAL_COMMUNITY): Payer: Medicaid Other | Admitting: Occupational Therapy

## 2019-09-06 ENCOUNTER — Inpatient Hospital Stay (HOSPITAL_COMMUNITY): Payer: Medicaid Other

## 2019-09-06 LAB — BASIC METABOLIC PANEL
Anion gap: 6 (ref 5–15)
BUN: 17 mg/dL (ref 6–20)
CO2: 23 mmol/L (ref 22–32)
Calcium: 8.7 mg/dL — ABNORMAL LOW (ref 8.9–10.3)
Chloride: 110 mmol/L (ref 98–111)
Creatinine, Ser: 1.32 mg/dL — ABNORMAL HIGH (ref 0.44–1.00)
GFR calc Af Amer: 55 mL/min — ABNORMAL LOW (ref >60)
GFR calc non Af Amer: 47 mL/min — ABNORMAL LOW (ref >60)
Glucose, Bld: 79 mg/dL (ref 70–99)
Potassium: 4.3 mmol/L (ref 3.5–5.1)
Sodium: 139 mmol/L (ref 135–145)

## 2019-09-06 LAB — GLUCOSE, CAPILLARY
Glucose-Capillary: 75 mg/dL (ref 70–99)
Glucose-Capillary: 84 mg/dL (ref 70–99)
Glucose-Capillary: 110 mg/dL — ABNORMAL HIGH (ref 70–99)
Glucose-Capillary: 104 mg/dL — ABNORMAL HIGH (ref 70–99)

## 2019-09-06 MED ORDER — MORPHINE SULFATE ER 15 MG PO TBCR
15.0000 mg | EXTENDED_RELEASE_TABLET | Freq: Every day | ORAL | Status: DC
  Administered 2019-09-07 – 2019-09-16 (×9): 15 mg via ORAL
  Filled 2019-09-06 (×9): qty 1

## 2019-09-06 MED ORDER — ACETAMINOPHEN 325 MG PO TABS: 650.0000 mg | ORAL_TABLET | ORAL | Status: DC | PRN

## 2019-09-06 MED ORDER — ACETAMINOPHEN 325 MG PO TABS
650.0000 mg | ORAL_TABLET | Freq: Three times a day (TID) | ORAL | Status: DC
  Administered 2019-09-06 – 2019-09-15 (×35): 650 mg via ORAL
  Filled 2019-09-06 (×38): qty 2

## 2019-09-06 NOTE — Progress Notes (Signed)
Occupational Therapy Weekly Progress Note  Patient Details  Name: [name] MRN: [id number] Date of Birth: 12-19-1970  Beginning of progress report period: August 30, 2019 End of progress report period: September 06, 2019  Today's Date: 09/06/2019 OT Individual Time: 0900-1000  & 1415-1530 OT Individual Time Calculation (min): 60 min & 75 min   Patient has met 5 of 5 short term goals.  Patient has made significant progress in all areas of self care, mobility and endurance.    Patient continues to demonstrate the following deficits: muscle weakness, decreased cardiorespiratoy endurance and decreased standing balance and decreased postural control and therefore will continue to benefit from skilled OT intervention to enhance overall performance with BADL and iADL.  Patient progressing toward long term goals..  Continue plan of care.  OT Short Term Goals Week 1:  OT Short Term Goal 1 (Week 1): Patient will complete bed mobility with mod A of one OT Short Term Goal 1 - Progress (Week 1): Met OT Short Term Goal 2 (Week 1): patient will complete sit to stand and SPT with RW min A of one OT Short Term Goal 2 - Progress (Week 1): Met OT Short Term Goal 3 (Week 1): patient will complete UB bathing/dressing with set up OT Short Term Goal 3 - Progress (Week 1): Met OT Short Term Goal 4 (Week 1): patient will complete LB bathing/dressing with mod A and demonstrate good carryover of AD use OT Short Term Goal 4 - Progress (Week 1): Met OT Short Term Goal 5 (Week 1): patient will tolerate standing for grooming tasks at sink for 5 minutes with CGA OT Short Term Goal 5 - Progress (Week 1): Met Week 2:  OT Short Term Goal 1 (Week 2): patient will complete LB bathing and dressing with AD supervision/set up OT Short Term Goal 2 (Week 2): patient will increase standing tolerance to 10 minutes OT Short Term Goal 3 (Week 2): patient will complete funcitonal transfers with CS  Skilled Therapeutic  Interventions/Progress Updates:    am session:   Patient in bed, alert and ready for therapy session.  On 2L O2 via [place] t/o session.  Supine to sitting with CS.    SPT and short distance ambulation with RW to w/c CS.  Patient completed grooming tasks at sink mod I.  Completed standing tolerance activity - she is able to stand for 6 minutes w/ support of walker CS level.  Completed standing dynamic balance activities with CS - able to tolerate 1-2 minutes at a time with seated rest breaks.  Returned to bed at close of session, min/mod A for legs into bed.  Call bell and tray table in reach.     PM session:   Patient in bed, alert and happy to take a shower today.  On 2L O2 via [place] t/o session.  Supine to sitting with CS.    SPT and short distance ambulation with RW to w/c CS.  She is able to gather clothing seated in w/c, ambulates to shower with RW and transfers to commode in shower stall with CS.  She is able to complete bathing at a supervision level today with long handled sponge and hand held shower.  Dressing completed seated in w/c - she is able to donn [place] shirt with set up, pants and underwear with min A using reacher, min A for clothing mgmt over hips.  Reviewed use of sock aide and after initial instruction she is able to donn socks with  min A.  Returned to bed at close of session, min/mod A for legs into bed.  Call bell and tray table in reach.    Therapy Documentation Precautions:  Precautions Precautions: Fall Precaution Comments: 2L O2 via [place] Restrictions Weight Bearing Restrictions: No General:   Vital Signs: Therapy Vitals Temp: 98.7 F (37.1 C) Pulse Rate: 79 Resp: 18 BP: 116/65 Patient Position (if appropriate): Lying Oxygen Therapy SpO2: 97 % O2 Device: Nasal Cannula O2 Flow Rate (L/min): 2 L/min Pain: Pain Assessment Pain Scale: 0-10 Pain Score: 0-No pain Faces Pain Scale: No hurt  Other Treatments:     Therapy/Group: Individual Therapy  Carlos Levering 09/06/2019, 3:50 PM

## 2019-09-06 NOTE — Progress Notes (Signed)
Physical Therapy Session Note  Patient Details  Name: [name] MRN: [id number] Date of Birth: Mar 07, 1971  Today's Date: 09/06/2019 PT Individual Time: 1030-1145 PT Individual Time Calculation (min): 75 min   Short Term Goals: Week 2:  PT Short Term Goal 1 (Week 2): STG=LTG due to ELOS.  Skilled Therapeutic Interventions/Progress Updates:    Functional bed mobility for supine to sit with supervision. Pt performed toilet transfer and clothing mangagement/hygiene at overall supervision level using RW for support and bariatric BSC. Supervision ambulatory transfer with RW to the w/c. Extra time between transitions due to low endurance. In gym. PA Pam Love reports verbal  goal to wean supplemental O2. Rest of session performed mobility on 1L O2 and monitored throughout (see below). Pt reports increased work of breathing and requires cues for slower breathing rate and increased depth of breath to expand lungs. Reports being limited due to R trunk/breast pain. Education provided on importance of slower and more full breaths to expand ribcage vs short quick breaths.   Engaged in alternating toe taps to 4" step with RW for support x 10 reps each x 3 sets with seated rest break between trials (see vitals below) due to decreased endurance and standing tolerance for carryover to stair negotiation. Pt able to tolerate longer standing trials between each up to about 1 min. Pt declined stair training today due to wanting to wait until she has shoes (sister plans to bring this weekend).  Gait training for functional mobility training and overall strengthening and endurance x 85', x 70', and x 60' with supervision with RW. Noted to have wider BOS, decrease step length and slightly flexed posture. Increased cadence noted today during each trial.   Returned back to bed end of session per patient preference with supervision with RW and then mod assist ot return to supine due to BLE management needed.   Therapy  Documentation Precautions:  Precautions Precautions: Fall Precaution Comments: 2L O2 via [place] Restrictions Weight Bearing Restrictions: No   Vital Signs:    95% and HR = 104 bpm after activity on 1L O2 94% and HR = 98 bpm after activity on 1L O2 94% and HR = 101 bpm after activity on 1 L O2 89% and HR = 111 bpm after gait on 1L O2 96% and HR = 97 bpm after gait on 1 L O2  Pain:  Reports pain is still present under breast area - premedicated.     Balance:  Five times Sit to Stand Test (FTSS) Method: Use a straight back chair with a solid seat that is 16-18" high. Ask participant to sit on the chair with arms folded across their chest.   Instructions: "Stand up and sit down as quickly as possible 5 times, keeping your arms folded across your chest."   Measurement: Stop timing when the participant stands the 5th time.  TIME: __28____ (in seconds) with RW and UE support  Times > 13.6 seconds is associated with increased disability and morbidity (Guralnik, 2000) Times > 15 seconds is predictive of recurrent falls in healthy individuals aged 73 and older (Buatois, et al., 2008) Normal performance values in community dwelling individuals aged 63 and older (Bohannon, 2006): o 60-69 years: 11.4 seconds o 70-79 years: 12.6 seconds o 80-89 years: 14.8 seconds  MCID: ? 2.3 seconds for Vestibular Disorders (Meretta, 2006)    Therapy/Group: Individual Therapy  Canary Brim Ivory Broad, PT, DPT, CBIS  09/06/2019, 11:52 AM

## 2019-09-06 NOTE — Progress Notes (Signed)
[place] PHYSICAL MEDICINE & REHABILITATION PROGRESS NOTE   Subjective/Complaints:    C/O abdominal pain this AM, but feels like needs to have BM as well-  Hb 7.5 -down from 7.9 and 8.1 and 8.3- just continuing to trend down.  Has dropped 1 unit total-   Cr down to 1.32- was 1.50 Needs to have staff help her to bathroom to have BM.    ROS; pt denies SOB, CP, endorses abd pain; denies N/V/D/C; denies vision changes  Objective:   No results found. Recent Labs    09/05/19 0551  WBC 6.9  HGB 7.5*  HCT 23.5*  PLT 388   Recent Labs    09/05/19 0551 09/06/19 0609  NA 141 139  K 4.2 4.3  CL 110 110  CO2 23 23  GLUCOSE 95 79  BUN 23* 17  CREATININE 1.50* 1.32*  CALCIUM 8.9 8.7*    Intake/Output Summary (Last 24 hours) at 09/06/2019 0852 Last data filed at 09/06/2019 0651 Gross per 24 hour  Intake 1093.92 ml  Output 725 ml  Net 368.92 ml     Physical Exam: Vital Signs Blood pressure 122/65, pulse 78, temperature 98.4 F (36.9 C), resp. rate 18, weight (!) 194.4 kg, last menstrual period 03/24/2017, SpO2 99 %.  Morbidly obese female with BMI of 65; appears more comfortable;lying in bariatric bed; frustrated about needing to go to bathroom;, wearing 2L O2 by [place], NAD HENT:  Head: Normocephalic and atraumatic.  Nose: Nose normal. [place] in place.  Mouth/Throat: Oropharynx is clear and moist.  Eyes:  Conjunctivae and EOM are normal.  Neck: No tracheal deviation present.  Cardiovascular: Normal rate and regular rhythm.  Respiratory:CTA B/L, however decreased breath sounds, esp at bases- GI: Soft. Bowel sounds are normal. There is no rebound and no guarding.  Soft, protuberant, mildly TTP diffusely; ND, (+) hypoactive BS  Musculoskeletal:        General: No tenderness or edema.     Cervical back: Normal range of motion and neck supple.     Comments: B/L UEs- deltoi 4-/5, biceps 4/5, triceps 4/5, WE 4/5, grip 4/5, finger abd 4-/5 LEs- HF 2+/5, KE 2+/5, DF/PF 4-/5 Very  large LEs- hard to move for examiner or pt.   Neurological:Sensation intact in all 4 extremities to light touch Skin: Skin is warm and extremely dry, esp on arms A little healing seen under L and between breasts under skin fold- smaller openings- under R breast, no significant change- still open/skin split with yeast notable.  1 IV in L forearm- no signs of infiltration  Psychiatric:  Slow to process/respond-a little frustrated this AM    Assessment/Plan: 1. Functional deficits secondary to debility due to liver bleeding adenoma and DKA/AKI which require 3+ hours per day of interdisciplinary therapy in a comprehensive inpatient rehab setting.  Physiatrist is providing close team supervision and 24 hour management of active medical problems listed below.  Physiatrist and rehab team continue to assess barriers to discharge/monitor patient progress toward functional and medical goals  Care Tool:  Bathing    Body parts bathed by patient: Right arm, Left arm, Chest, Abdomen, Front perineal area, Face, Right upper leg, Left upper leg, Right lower leg, Left lower leg, Buttocks   Body parts bathed by helper: Buttocks, Right lower leg, Left lower leg     Bathing assist Assist Level: Minimal Assistance - Patient > 75%     Upper Body Dressing/Undressing Upper body dressing   What is the patient wearing?: Pull  over shirt    Upper body assist Assist Level: Set up assist    Lower Body Dressing/Undressing Lower body dressing      What is the patient wearing?: Underwear/pull up, Pants     Lower body assist Assist for lower body dressing: Moderate Assistance - Patient 50 - 74%     Toileting Toileting    Toileting assist Assist for toileting: Contact Guard/Touching assist     Transfers Chair/bed transfer  Transfers assist     Chair/bed transfer assist level: Supervision/Verbal cueing Chair/bed transfer assistive device: Walker, Armrests    Locomotion Ambulation   Ambulation assist   Ambulation activity did not occur: Safety/medical concerns(bilateral LE weakness, fatigue, SOB)  Assist level: Supervision/Verbal cueing Assistive device: Walker-rolling Max distance: 60'   Walk 10 feet activity   Assist  Walk 10 feet activity did not occur: Safety/medical concerns(bilateral LE weakness, fatigue, SOB)  Assist level: Supervision/Verbal cueing Assistive device: Walker-rolling   Walk 50 feet activity   Assist Walk 50 feet with 2 turns activity did not occur: Safety/medical concerns(bilateral LE weakness, fatigue, SOB)  Assist level: Supervision/Verbal cueing Assistive device: Walker-rolling    Walk 150 feet activity   Assist Walk 150 feet activity did not occur: Safety/medical concerns(bilateral LE weakness, fatigue, SOB)         Walk 10 feet on uneven surface  activity   Assist Walk 10 feet on uneven surfaces activity did not occur: Safety/medical concerns(bilateral LE weakness, fatigue, SOB)         Wheelchair     Assist Will patient use wheelchair at discharge?: Yes Type of Wheelchair: Manual Wheelchair activity did not occur: Safety/medical concerns(fatigue, SOB, decreased endurance)         Wheelchair 50 feet with 2 turns activity    Assist    Wheelchair 50 feet with 2 turns activity did not occur: Safety/medical concerns(fatigue, SOB, decreased endurance)       Wheelchair 150 feet activity     Assist  Wheelchair 150 feet activity did not occur: Safety/medical concerns(fatigue, SOB, decreased endurance)       Blood pressure 122/65, pulse 78, temperature 98.4 F (36.9 C), resp. rate 18, weight (!) 194.4 kg, last menstrual period 03/24/2017, SpO2 99 %.  1.  Impaired mobility, ADLs and function secondary to debility due to hemrrhagic shock due to bleeding adenoma, AKI, and DKA             -patient may  shower             -ELOS/Goals: 7-10 days/ supervision to min  assist 2.  Antithrombotics: -DVT/anticoagulation:  Mechanical: Sequential compression devices, below knee Bilateral lower extremities  -would benefit from Lovenox- will see if possible since her size and immobility             -antiplatelet therapy: N/A 3. Pain Management: Need to educated/encourage patient to work thorough pain as > 7 days post embolization. Continue MS contin with MSIR   4. Mood: LCSW to follow for evaluation and support.              -antipsychotic agents: N/A 5. Neuropsych: This patient is capable of making decisions on her own behalf. 6. Skin/Wound Care:  Antimicrobial fabric to absorb moisture to assist in management of MASD.  7. Fluids/Electrolytes/Nutrition: Continue Ensure max. Will add prostat for depleted protein stores.  8. Liver adenomas with bleeding s/p embolization: Continue to monitor H/H. 9. OSA/OHS: Continue 2L oxygen per [place]--encourage CPAP at nights. Continue Dulera bid 10. T2DM:  Hgb A1c- 8.6--> improving/sees Dr. Loanne Drilling. Continue to monitor BS ac/hs and titrate lantus as indicated--intake poor at this time. Continue Lantus with SSI  CBG (last 3)  Recent Labs    09/05/19 1658 09/05/19 2106 09/06/19 0601  GLUCAP 87 98 75    2/2- well controlled BGs  2/4- [id number]- will con't regimen  2/5- 75-98 great BGs- con't regimen 11. Acute on chronic renal failure?: SCR improved from 3.10-->1.29  1/29- Cr 1.09 and BUN 20  2/1- Cr bounced back to 1.4- BUN 21- no Lasix/fluid pill on board- will encourage slight increase in fluid intake and recheck Wednesday.   2/3- will recheck in AM- esp since starting Diflucan  2/4- will give some IVFs for Cr of 1.5 and BUN of 23- will give NS 75cc/hour x 1L due to elevated kidney function and recheck in AM.   2/5- IVFs helpful- BUN 17; Cr 1.32 12. Chronic combined systolic CHF: Check weights daily. Monitor for signs of overload. On Entresto, pravastatin, Bidil and coreg.    Filed Weights   08/30/19 0545 2019-09-22 0432  09/03/19 0500  Weight: (!) 195.4 kg (!) 192.3 kg (!) 194.4 kg    1/29- will monitor weights for trend, daily  Sep 21, 2022: Weight significantly down.   1/31: no weight  2/1- no weight  2/2- Weight down by 1 Kilo overall  2/4- no weight today 13. H/o Lupus: On Plaquenil bid with prednisone. On Lyrica for pain management.   14. H/o anxiety/depression: On Wellbutrin and cymbalta with Xanax prn. - getting over recent death of brother.   09/21/2022: Likely exacerbated by SOB. On [place] 2L, satting above 96%. 15. Hematuria?/Vaginal bleeding?: Nuvaring out PTA. Reports heavy bleeding off megestrol--?resume.  16. Constipation: September 21, 2022: patient states last BM was Monday and she feels constipated. Will order mag citrate. Patient worried about making it to bathroom on time. Advised that bedside commode will be placed next to her prior to administration.   2/1- 3 BMs in last 36 hours- last 1 at 2pm- hopefully feeling better.    17. Denuded skin under breasts- yeast?; placed WOC consult and will follow guidelines.    2/3- will add Diflucan 100 mg daily x 6 days for yeast adding to problems with yeast underneath breasts.   2/4- wounds are healing somewhat/so far.   LOS: 8 days A FACE TO FACE EVALUATION WAS PERFORMED  Clenton Esper 09/06/2019, 8:52 AM

## 2019-09-07 ENCOUNTER — Inpatient Hospital Stay (HOSPITAL_COMMUNITY): Payer: Medicaid Other | Admitting: Physical Therapy

## 2019-09-07 LAB — GLUCOSE, CAPILLARY
Glucose-Capillary: 77 mg/dL (ref 70–99)
Glucose-Capillary: 102 mg/dL — ABNORMAL HIGH (ref 70–99)
Glucose-Capillary: 102 mg/dL — ABNORMAL HIGH (ref 70–99)
Glucose-Capillary: 104 mg/dL — ABNORMAL HIGH (ref 70–99)

## 2019-09-07 NOTE — Progress Notes (Signed)
Physical Therapy Session Note  Patient Details  Name: [name] MRN: [id number] Date of Birth: 1970/09/03  Today's Date: 09/07/2019 PT Individual Time: 9106-8166 PT Individual Time Calculation (min): 40 min   Short Term Goals: Week 1:  PT Short Term Goal 1 (Week 1): Pt will perform bed mobility mod A PT Short Term Goal 1 - Progress (Week 1): Met PT Short Term Goal 2 (Week 1): Pt will transfer stand<>pivot with LRAD max A PT Short Term Goal 2 - Progress (Week 1): Met PT Short Term Goal 3 (Week 1): Pt will perform car transfer with LRAD max A PT Short Term Goal 3 - Progress (Week 1): Met  Skilled Therapeutic Interventions/Progress Updates:  Pt was seen bedside in the am. Pt transferred supine to edge of bed with side rail and S. Pt performed all transfers with rolling walker and S. Pt ambulated 80 feet and 60 feet x 2 with rolling walker and S. Pt performed toilet transfers with rolling walker and S. During ambulation had one episode dizziness improved with rest break. Pt returned to room and left sitting up in recliner with call bell within reach.   Therapy Documentation Precautions:  Precautions Precautions: Fall Precaution Comments: 2L O2 via [place] Restrictions Weight Bearing Restrictions: No General:   Pain: No c/o pain.   Therapy/Group: Individual Therapy  Dub Amis 09/07/2019, 12:20 PM

## 2019-09-07 NOTE — Progress Notes (Signed)
[place] PHYSICAL MEDICINE & REHABILITATION PROGRESS NOTE   Subjective/Complaints:    Pt reports abd pain is doing better overall- even with d/w of MS contin and just taking MSIR and tylenol.   Was seen after rounds walking down hall with RW and PT.    ROS; pt denies SOB, CP, endorses abd pain; denies N/V/D/C; denies vision changes  Objective:   No results found. Recent Labs    09/05/19 0551  WBC 6.9  HGB 7.5*  HCT 23.5*  PLT 388   Recent Labs    09/05/19 0551 09/06/19 0609  NA 141 139  K 4.2 4.3  CL 110 110  CO2 23 23  GLUCOSE 95 79  BUN 23* 17  CREATININE 1.50* 1.32*  CALCIUM 8.9 8.7*    Intake/Output Summary (Last 24 hours) at 09/07/2019 1257 Last data filed at 09/06/2019 2303 Gross per 24 hour  Intake 360 ml  Output 275 ml  Net 85 ml     Physical Exam: Vital Signs Blood pressure 136/72, pulse 82, temperature 98.9 F (37.2 C), resp. rate 20, weight 133 kg, last menstrual period 03/24/2017, SpO2 99 %.  Morbidly obese female with BMI of 65; sitting up in bed; appears more comfortable; less abd pain; wearing 2L O2 by [place], NAD HENT:  Head: Normocephalic and atraumatic.  Nose: Nose normal. [place] in place.  Mouth/Throat: Oropharynx is clear and moist.  Eyes:  Conjunctivae and EOM are normal.  Neck: No tracheal deviation present.  Cardiovascular: Normal rate and regular rhythm.  Respiratory:CTA B/L, however slightly decreased at bases; On O2 by [place] 2L- GI: Soft. Bowel sounds are normal. There is no rebound and no guarding.  Soft, protuberant, even less/ mildly TTP diffusely; ND, (+)  BS  Musculoskeletal:        General: No tenderness or edema.     Cervical back: Normal range of motion and neck supple.     Comments: B/L UEs- deltoi 4-/5, biceps 4/5, triceps 4/5, WE 4/5, grip 4/5, finger abd 4-/5 LEs- HF 2+/5, KE 2+/5, DF/PF 4-/5 Very large LEs- hard to move for examiner or pt.   Neurological:Sensation intact in all 4 extremities to light touch Skin: Skin is  warm and extremely dry, esp on arms A little healing seen under L and between breasts under skin fold- smaller openings- under R breast, no significant change- still open/skin split with yeast notable.  1 IV in L forearm- no signs of infiltration  Psychiatric:  Slow to process/respond-brighter affect about walking this AM    Assessment/Plan: 1. Functional deficits secondary to debility due to liver bleeding adenoma and DKA/AKI which require 3+ hours per day of interdisciplinary therapy in a comprehensive inpatient rehab setting.  Physiatrist is providing close team supervision and 24 hour management of active medical problems listed below.  Physiatrist and rehab team continue to assess barriers to discharge/monitor patient progress toward functional and medical goals  Care Tool:  Bathing    Body parts bathed by patient: Right arm, Left arm, Chest, Abdomen, Front perineal area, Face, Right upper leg, Left upper leg, Right lower leg, Left lower leg, Buttocks   Body parts bathed by helper: Buttocks, Right lower leg, Left lower leg     Bathing assist Assist Level: Supervision/Verbal cueing     Upper Body Dressing/Undressing Upper body dressing   What is the patient wearing?: Pull over shirt    Upper body assist Assist Level: Set up assist    Lower Body Dressing/Undressing Lower body dressing  What is the patient wearing?: Underwear/pull up, Pants     Lower body assist Assist for lower body dressing: Minimal Assistance - Patient > 75%     Toileting Toileting    Toileting assist Assist for toileting: Minimal Assistance - Patient > 75%     Transfers Chair/bed transfer  Transfers assist     Chair/bed transfer assist level: Supervision/Verbal cueing Chair/bed transfer assistive device: Walker, Armrests   Locomotion Ambulation   Ambulation assist   Ambulation activity did not occur: Safety/medical concerns(bilateral LE weakness, fatigue, SOB)  Assist level:  Supervision/Verbal cueing Assistive device: Walker-rolling Max distance: 80   Walk 10 feet activity   Assist  Walk 10 feet activity did not occur: Safety/medical concerns(bilateral LE weakness, fatigue, SOB)  Assist level: Supervision/Verbal cueing Assistive device: Walker-rolling   Walk 50 feet activity   Assist Walk 50 feet with 2 turns activity did not occur: Safety/medical concerns(bilateral LE weakness, fatigue, SOB)  Assist level: Supervision/Verbal cueing Assistive device: Walker-rolling    Walk 150 feet activity   Assist Walk 150 feet activity did not occur: Safety/medical concerns(bilateral LE weakness, fatigue, SOB)         Walk 10 feet on uneven surface  activity   Assist Walk 10 feet on uneven surfaces activity did not occur: Safety/medical concerns(bilateral LE weakness, fatigue, SOB)         Wheelchair     Assist Will patient use wheelchair at discharge?: Yes Type of Wheelchair: Manual Wheelchair activity did not occur: Safety/medical concerns(fatigue, SOB, decreased endurance)         Wheelchair 50 feet with 2 turns activity    Assist    Wheelchair 50 feet with 2 turns activity did not occur: Safety/medical concerns(fatigue, SOB, decreased endurance)       Wheelchair 150 feet activity     Assist  Wheelchair 150 feet activity did not occur: Safety/medical concerns(fatigue, SOB, decreased endurance)       Blood pressure 136/72, pulse 82, temperature 98.9 F (37.2 C), resp. rate 20, weight 133 kg, last menstrual period 03/24/2017, SpO2 99 %.  1.  Impaired mobility, ADLs and function secondary to debility due to hemrrhagic shock due to bleeding adenoma, AKI, and DKA             -patient may  shower             -ELOS/Goals: 7-10 days/ supervision to min assist 2.  Antithrombotics: -DVT/anticoagulation:  Mechanical: Sequential compression devices, below knee Bilateral lower extremities  -would benefit from Lovenox- will see  if possible since her size and immobility             -antiplatelet therapy: N/A 3. Pain Management: Need to educated/encourage patient to work thorough pain as > 7 days post embolization. Continue MS contin with MSIR  2/6- stopped MS contin and continued MSIR and tylenol- pt still says pain better controlled  4. Mood: LCSW to follow for evaluation and support.              -antipsychotic agents: N/A 5. Neuropsych: This patient is capable of making decisions on her own behalf. 6. Skin/Wound Care:  Antimicrobial fabric to absorb moisture to assist in management of MASD.  7. Fluids/Electrolytes/Nutrition: Continue Ensure max. Will add prostat for depleted protein stores.  8. Liver adenomas with bleeding s/p embolization: Continue to monitor H/H. 9. OSA/OHS: Continue 2L oxygen per [place]--encourage CPAP at nights. Continue Dulera bid 10. T2DM: Hgb A1c- 8.6--> improving/sees Dr. Loanne Drilling. Continue to monitor BS ac/hs  and titrate lantus as indicated--intake poor at this time. Continue Lantus with SSI  CBG (last 3)  Recent Labs    09/06/19 2124 09/07/19 0627 09/07/19 1206  GLUCAP 104* 77 102*    2/2- well controlled BGs  2/4- [id number]- will con't regimen  2/6- BGs 77-104- great control;  con't regimen 11. Acute on chronic renal failure?: SCR improved from 3.10-->1.29  1/29- Cr 1.09 and BUN 20  2/1- Cr bounced back to 1.4- BUN 21- no Lasix/fluid pill on board- will encourage slight increase in fluid intake and recheck Wednesday.   2/3- will recheck in AM- esp since starting Diflucan  2/4- will give some IVFs for Cr of 1.5 and BUN of 23- will give NS 75cc/hour x 1L due to elevated kidney function and recheck in AM.   2/5- IVFs helpful- BUN 17; Cr 1.32 12. Chronic combined systolic CHF: Check weights daily. Monitor for signs of overload. On Entresto, pravastatin, Bidil and coreg.    Filed Weights   09/30/2019 0432 09/03/19 0500 09/07/19 0521  Weight: (!) 192.3 kg (!) 194.4 kg 133 kg    1/29- will  monitor weights for trend, daily  09-29-22: Weight significantly down.   1/31: no weight  2/1- no weight  2/2- Weight down by 1 Kilo overall  2/4- no weight today  2/6- Weight wrong- was 133 kg when she was 194 kg 2 days prior 13. H/o Lupus: On Plaquenil bid with prednisone. On Lyrica for pain management.   14. H/o anxiety/depression: On Wellbutrin and cymbalta with Xanax prn. - getting over recent death of brother.   Sep 29, 2022: Likely exacerbated by SOB. On [place] 2L, satting above 96%. 15. Hematuria?/Vaginal bleeding?: Nuvaring out PTA. Reports heavy bleeding off megestrol--?resume.  16. Constipation: 09/29/2022: patient states last BM was Monday and she feels constipated. Will order mag citrate. Patient worried about making it to bathroom on time. Advised that bedside commode will be placed next to her prior to administration.   2/1- 3 BMs in last 36 hours- last 1 at 2pm- hopefully feeling better.    17. Denuded skin under breasts- yeast?; placed WOC consult and will follow guidelines.    2/3- will add Diflucan 100 mg daily x 6 days for yeast adding to problems with yeast underneath breasts.   2/4- wounds are healing somewhat/so far.   LOS: 9 days A FACE TO FACE EVALUATION WAS PERFORMED  Skippy Marhefka 09/07/2019, 12:57 PM

## 2019-09-07 NOTE — Plan of Care (Signed)
  Problem: Consults Goal: RH GENERAL PATIENT EDUCATION Description: See Patient Education module for education specifics. Outcome: Progressing Goal: Skin Care Protocol Initiated - if Braden Score 18 or less Description: If consults are not indicated, leave blank or document N/A Outcome: Progressing Goal: Diabetes Guidelines if Diabetic/Glucose > 140 Description: If diabetic or lab glucose is > 140 mg/dl - Initiate Diabetes/Hyperglycemia Guidelines & Document Interventions  Outcome: Progressing   Problem: RH BLADDER ELIMINATION Goal: RH STG MANAGE BLADDER WITH ASSISTANCE Description: STG Manage Bladder With min/mod Assistance Outcome: Progressing   Problem: RH SKIN INTEGRITY Goal: RH STG SKIN FREE OF INFECTION/BREAKDOWN Description: Pt will be free of skin breakdown prior to DC with min/mod assist Outcome: Progressing Goal: RH STG MAINTAIN SKIN INTEGRITY WITH ASSISTANCE Description: STG Maintain Skin Integrity With min/mod Assistance. Outcome: Progressing Goal: RH STG ABLE TO PERFORM INCISION/WOUND CARE W/ASSISTANCE Description: STG Able To Perform Incision/Wound Care With mod Assistance. Outcome: Progressing   Problem: RH SAFETY Goal: RH STG ADHERE TO SAFETY PRECAUTIONS W/ASSISTANCE/DEVICE Description: STG Adhere to Safety Precautions With cues/reminders Assistance/Device. Outcome: Progressing   Problem: RH PAIN MANAGEMENT Goal: RH STG PAIN MANAGED AT OR BELOW PT'S PAIN GOAL Description: Less than 4 on 0-10 scale Outcome: Progressing

## 2019-09-08 LAB — GLUCOSE, CAPILLARY
Glucose-Capillary: 78 mg/dL (ref 70–99)
Glucose-Capillary: 100 mg/dL — ABNORMAL HIGH (ref 70–99)
Glucose-Capillary: 84 mg/dL (ref 70–99)
Glucose-Capillary: 92 mg/dL (ref 70–99)

## 2019-09-08 NOTE — Progress Notes (Signed)
[place] PHYSICAL MEDICINE & REHABILITATION PROGRESS NOTE   Subjective/Complaints:    Placed on CPAP last night (didn't refuse Sat night). Pt reports no issues- abd pain is doing much better-  Ready for her "day off".   ROS; pt denies SOB, CP, endorses abd pain; denies N/V/D/C; denies vision changes  Objective:   No results found. No results for input(s): WBC, HGB, HCT, PLT in the last 72 hours. Recent Labs    09/06/19 0609  NA 139  K 4.3  CL 110  CO2 23  GLUCOSE 79  BUN 17  CREATININE 1.32*  CALCIUM 8.7*    Intake/Output Summary (Last 24 hours) at 09/08/2019 1241 Last data filed at 09/08/2019 [id number] Gross per 24 hour  Intake 720 ml  Output --  Net 720 ml     Physical Exam: Vital Signs Blood pressure (!) 151/77, pulse 88, temperature 98.8 F (37.1 C), resp. rate 20, weight 133 kg, last menstrual period 03/24/2017, SpO2 100 %.  Morbidly obese female with BMI of 65; laying in bariatric bed; appears more comfortable; wearing 2L O2 by [place], NAD HENT:  Head: Normocephalic and atraumatic.  Nose: Nose normal. [place] in place.  Mouth/Throat: Oropharynx is clear and moist.  Eyes:  Conjunctivae and EOM are normal.  Neck: No tracheal deviation present.  Cardiovascular: Normal rate and regular rhythm.  Respiratory:CTA B/L, however slightly decreased at bases; On O2 by [place] 2L- GI: Soft. Bowel sounds are normal. There is no rebound and no guarding.  Soft, protuberant, even less/ mildly TTP diffusely; ND, (+)  BS  Musculoskeletal:        General: No tenderness or edema.     Cervical back: Normal range of motion and neck supple.     Comments: B/L UEs- deltoi 4-/5, biceps 4/5, triceps 4/5, WE 4/5, grip 4/5, finger abd 4-/5 LEs- HF 2+/5, KE 2+/5, DF/PF 4-/5 Very large LEs- hard to move for examiner or pt.   Neurological:Sensation intact in all 4 extremities to light touch Skin: Skin is warm and extremely dry, esp on arms A little healing seen under L and between breasts under skin  fold- smaller openings- under R breast, no significant change- still open/skin split with yeast notable.  1 IV in L forearm- no signs of infiltration  Psychiatric:  Slow to process/respond-brighter affect about walking this AM    Assessment/Plan: 1. Functional deficits secondary to debility due to liver bleeding adenoma and DKA/AKI which require 3+ hours per day of interdisciplinary therapy in a comprehensive inpatient rehab setting.  Physiatrist is providing close team supervision and 24 hour management of active medical problems listed below.  Physiatrist and rehab team continue to assess barriers to discharge/monitor patient progress toward functional and medical goals  Care Tool:  Bathing    Body parts bathed by patient: Right arm, Left arm, Chest, Abdomen, Front perineal area, Face, Right upper leg, Left upper leg, Right lower leg, Left lower leg, Buttocks   Body parts bathed by helper: Buttocks, Right lower leg, Left lower leg     Bathing assist Assist Level: Supervision/Verbal cueing     Upper Body Dressing/Undressing Upper body dressing   What is the patient wearing?: Pull over shirt    Upper body assist Assist Level: Set up assist    Lower Body Dressing/Undressing Lower body dressing      What is the patient wearing?: Underwear/pull up, Pants     Lower body assist Assist for lower body dressing: Minimal Assistance - Patient > 75%  Toileting Toileting    Toileting assist Assist for toileting: Minimal Assistance - Patient > 75%     Transfers Chair/bed transfer  Transfers assist     Chair/bed transfer assist level: Supervision/Verbal cueing Chair/bed transfer assistive device: Walker, Clinical biochemist   Ambulation assist   Ambulation activity did not occur: Safety/medical concerns(bilateral LE weakness, fatigue, SOB)  Assist level: Supervision/Verbal cueing Assistive device: Walker-rolling Max distance: 80   Walk 10 feet  activity   Assist  Walk 10 feet activity did not occur: Safety/medical concerns(bilateral LE weakness, fatigue, SOB)  Assist level: Supervision/Verbal cueing Assistive device: Walker-rolling   Walk 50 feet activity   Assist Walk 50 feet with 2 turns activity did not occur: Safety/medical concerns(bilateral LE weakness, fatigue, SOB)  Assist level: Supervision/Verbal cueing Assistive device: Walker-rolling    Walk 150 feet activity   Assist Walk 150 feet activity did not occur: Safety/medical concerns(bilateral LE weakness, fatigue, SOB)         Walk 10 feet on uneven surface  activity   Assist Walk 10 feet on uneven surfaces activity did not occur: Safety/medical concerns(bilateral LE weakness, fatigue, SOB)         Wheelchair     Assist Will patient use wheelchair at discharge?: Yes Type of Wheelchair: Manual Wheelchair activity did not occur: Safety/medical concerns(fatigue, SOB, decreased endurance)         Wheelchair 50 feet with 2 turns activity    Assist    Wheelchair 50 feet with 2 turns activity did not occur: Safety/medical concerns(fatigue, SOB, decreased endurance)       Wheelchair 150 feet activity     Assist  Wheelchair 150 feet activity did not occur: Safety/medical concerns(fatigue, SOB, decreased endurance)       Blood pressure (!) 151/77, pulse 88, temperature 98.8 F (37.1 C), resp. rate 20, weight 133 kg, last menstrual period 03/24/2017, SpO2 100 %.  1.  Impaired mobility, ADLs and function secondary to debility due to hemrrhagic shock due to bleeding adenoma, AKI, and DKA             -patient may  shower             -ELOS/Goals: 7-10 days/ supervision to min assist 2.  Antithrombotics: -DVT/anticoagulation:  Mechanical: Sequential compression devices, below knee Bilateral lower extremities  -would benefit from Lovenox- will see if possible since her size and immobility             -antiplatelet therapy: N/A 3. Pain  Management: Need to educated/encourage patient to work thorough pain as > 7 days post embolization. Continue MS contin with MSIR  2/6- stopped MS contin and continued MSIR and tylenol- pt still says pain better controlled  4. Mood: LCSW to follow for evaluation and support.              -antipsychotic agents: N/A 5. Neuropsych: This patient is capable of making decisions on her own behalf. 6. Skin/Wound Care:  Antimicrobial fabric to absorb moisture to assist in management of MASD.  7. Fluids/Electrolytes/Nutrition: Continue Ensure max. Will add prostat for depleted protein stores.  8. Liver adenomas with bleeding s/p embolization: Continue to monitor H/H. 9. OSA/OHS: Continue 2L oxygen per [place]--encourage CPAP at nights. Continue Dulera bid 10. T2DM: Hgb A1c- 8.6--> improving/sees Dr. Loanne Drilling. Continue to monitor BS ac/hs and titrate lantus as indicated--intake poor at this time. Continue Lantus with SSI  CBG (last 3)  Recent Labs    09/07/19 2104 09/08/19 0636 09/08/19 1129  GLUCAP 104* 78 100*    2/2- well controlled BGs  2/[id number]- will con't regimen  2/6-2/7- BGs 77-104- great control stil;  con't regimen 11. Acute on chronic renal failure?: SCR improved from 3.10-->1.29  1/29- Cr 1.09 and BUN 20  2/1- Cr bounced back to 1.4- BUN 21- no Lasix/fluid pill on board- will encourage slight increase in fluid intake and recheck Wednesday.   2/3- will recheck in AM- esp since starting Diflucan  2/4- will give some IVFs for Cr of 1.5 and BUN of 23- will give NS 75cc/hour x 1L due to elevated kidney function and recheck in AM.   2/5- IVFs helpful- BUN 17; Cr 1.32  2/7- labs tomorrow AM.  12. Chronic combined systolic CHF: Check weights daily. Monitor for signs of overload. On Entresto, pravastatin, Bidil and coreg.    Filed Weights   2019-09-24 0432 09/03/19 0500 09/07/19 0521  Weight: (!) 192.3 kg (!) 194.4 kg 133 kg    1/29- will monitor weights for trend, daily  09/23/2022: Weight  significantly down.   1/31: no weight  2/1- no weight  2/2- Weight down by 1 Kilo overall  2/4- no weight today  2/6- Weight wrong- was 133 kg when she was 194 kg 2 days prior  2/7- no weight 13. H/o Lupus: On Plaquenil bid with prednisone. On Lyrica for pain management.   14. H/o anxiety/depression: On Wellbutrin and cymbalta with Xanax prn. - getting over recent death of brother.   09-23-22: Likely exacerbated by SOB. On [place] 2L, satting above 96%. 15. Hematuria?/Vaginal bleeding?: Nuvaring out PTA. Reports heavy bleeding off megestrol--?resume.  16. Constipation: 09/23/2022: patient states last BM was Monday and she feels constipated. Will order mag citrate. Patient worried about making it to bathroom on time. Advised that bedside commode will be placed next to her prior to administration.   2/1- 3 BMs in last 36 hours- last 1 at 2pm- hopefully feeling better.    17. Denuded skin under breasts- yeast?; placed WOC consult and will follow guidelines.    2/3- will add Diflucan 100 mg daily x 6 days for yeast adding to problems with yeast underneath breasts.   2/4- wounds are healing somewhat/so far.   LOS: 10 days A FACE TO FACE EVALUATION WAS PERFORMED  [name] 09/08/2019, 12:41 PM

## 2019-09-08 NOTE — Progress Notes (Signed)
Placed pt on CPAP on Nasal Mask with a pressure of 4 and 2L bled in. Pt tolerating well

## 2019-09-09 ENCOUNTER — Inpatient Hospital Stay (HOSPITAL_COMMUNITY): Payer: Medicaid Other | Admitting: Occupational Therapy

## 2019-09-09 ENCOUNTER — Inpatient Hospital Stay (HOSPITAL_COMMUNITY): Payer: Medicaid Other

## 2019-09-09 LAB — URINALYSIS, ROUTINE W REFLEX MICROSCOPIC
Bilirubin Urine: NEGATIVE
Glucose, UA: NEGATIVE mg/dL
Hgb urine dipstick: NEGATIVE
Ketones, ur: NEGATIVE mg/dL
Leukocytes,Ua: NEGATIVE
Nitrite: NEGATIVE
Protein, ur: NEGATIVE mg/dL
Specific Gravity, Urine: 1.013 (ref 1.005–1.030)
pH: 8 (ref 5.0–8.0)

## 2019-09-09 LAB — COMPREHENSIVE METABOLIC PANEL
ALT: 19 U/L (ref 0–44)
AST: 18 U/L (ref 15–41)
Albumin: 2.4 g/dL — ABNORMAL LOW (ref 3.5–5.0)
Alkaline Phosphatase: 96 U/L (ref 38–126)
Anion gap: 12 (ref 5–15)
BUN: 9 mg/dL (ref 6–20)
CO2: 23 mmol/L (ref 22–32)
Calcium: 8.9 mg/dL (ref 8.9–10.3)
Chloride: 108 mmol/L (ref 98–111)
Creatinine, Ser: 1.26 mg/dL — ABNORMAL HIGH (ref 0.44–1.00)
GFR calc Af Amer: 58 mL/min — ABNORMAL LOW (ref >60)
GFR calc non Af Amer: 50 mL/min — ABNORMAL LOW (ref >60)
Glucose, Bld: 96 mg/dL (ref 70–99)
Potassium: 3.8 mmol/L (ref 3.5–5.1)
Sodium: 143 mmol/L (ref 135–145)
Total Bilirubin: 0.4 mg/dL (ref 0.3–1.2)
Total Protein: 5.6 g/dL — ABNORMAL LOW (ref 6.5–8.1)

## 2019-09-09 LAB — CBC
HCT: 22.2 % — ABNORMAL LOW (ref 36.0–46.0)
Hemoglobin: 7.1 g/dL — ABNORMAL LOW (ref 12.0–15.0)
MCH: 27.7 pg (ref 26.0–34.0)
MCHC: 32 g/dL (ref 30.0–36.0)
MCV: 86.7 fL (ref 80.0–100.0)
Platelets: 379 10*3/uL (ref 150–400)
RBC: 2.56 MIL/uL — ABNORMAL LOW (ref 3.87–5.11)
RDW: 14.9 % (ref 11.5–15.5)
WBC: 6.7 10*3/uL (ref 4.0–10.5)
nRBC: 0 % (ref 0.0–0.2)

## 2019-09-09 LAB — GLUCOSE, CAPILLARY
Glucose-Capillary: 92 mg/dL (ref 70–99)
Glucose-Capillary: 105 mg/dL — ABNORMAL HIGH (ref 70–99)
Glucose-Capillary: 101 mg/dL — ABNORMAL HIGH (ref 70–99)
Glucose-Capillary: 88 mg/dL (ref 70–99)

## 2019-09-09 MED ORDER — LIDOCAINE 5 % EX PTCH
1.0000 | MEDICATED_PATCH | CUTANEOUS | Status: DC
  Administered 2019-09-09 – 2019-09-17 (×9): 1 via TRANSDERMAL
  Filled 2019-09-09 (×10): qty 1

## 2019-09-09 NOTE — Progress Notes (Signed)
Physical Therapy Session Note  Patient Details  Name: [name] MRN: [id number] Date of Birth: 10-25-1970  Today's Date: 09/09/2019 PT Individual Time: 1120-1205 PT Individual Time Calculation (min): 45 min   Short Term Goals: Week 2:  PT Short Term Goal 1 (Week 2): STG=LTG due to ELOS.  Skilled Therapeutic Interventions/Progress Updates:     Patient in bed on 2L/min O2 upon PT arrival. Patient alert and agreeable to PT session. Patient reported 8/10 diffuse R stomach to low back pain during session, RN made aware. PT provided repositioning, rest breaks, and distraction as pain interventions throughout session. She reported increased frequency of urination and requested to stay close to a bathroom throughout session. Reports voiding 1-2 times per hour with mild pressure and urgency, leading to intermittent bouts of incontinence due to waiting for staff. Denied pain with voiding at this time. Also, required increased time to void while on [facility] during session. Educated on relaxation techniques to empty her bladder and pelvic floor activation and relaxation to promote continence. RN aware of patient's urinary urgency/frequency.   Therapeutic Activity: Bed Mobility: Patient performed supine to sit with supervision with min use of bed rails. Provided verbal cues for decreased use of bed rails to simulate home set up and pushing through elbows to come to sitting. Transfers: Patient performed sit to/from stand x2 and stand pivot bed<>BSC with supervision using a bariatric RW. Provided verbal cues for reaching back to sit, patient able to self correct hand positioning with use of RW to stand without cues from therapist this session. She was continent of bladder on BSC and required supervision-mod I for peri-care and LB dressing during toileting.   Gait Training:  Patient ambulated 95 feet x2 using a bariatric RW with supervision and a w/c follow due to decreased activity tolerance. Ambulated with  decreased step length and height, wide BOS, and increased forward trunk flexion. Provided verbal cues for paced breathing, erect posture, and increased step height. Encouraged pacing her speed to accomidate for increased distance with ambulation this session. RPE 7-8/10 after with mild SOB, SPO2 >94% on 2L after each trial, HR between 106-110.   Patient in w/c in room at end of session with breaks locked and all needs within reach. Patient remained on 2L O2 throughout session, SPO2 >94% throughout. Discussed weaning O2 with Pam, PA, and she is in agreement and hoping that the patient will be able to return to only wearing O2 at night, as she did PTA.    Therapy Documentation Precautions:  Precautions Precautions: Fall Precaution Comments: 2L O2 via [place] Restrictions Weight Bearing Restrictions: No    Therapy/Group: Individual Therapy  Siya Flurry L Omarian Jaquith PT, DPT  09/09/2019, 5:14 PM

## 2019-09-09 NOTE — Progress Notes (Signed)
Occupational Therapy Session Note  Patient Details  Name: [name] MRN: [id number] Date of Birth: 1970-10-05  Today's Date: 09/09/2019 OT Individual Time: 0945-1100 OT Individual Time Calculation (min): 75 min    Short Term Goals: Week 2:  OT Short Term Goal 1 (Week 2): patient will complete LB bathing and dressing with AD supervision/set up OT Short Term Goal 2 (Week 2): patient will increase standing tolerance to 10 minutes OT Short Term Goal 3 (Week 2): patient will complete funcitonal transfers with CS  Skilled Therapeutic Interventions/Progress Updates:    Treatment session with focus on LB dressing and activity tolerance.  Pt received in bed asleep but easily aroused and agreeable to therapy session.  Pt reports urinary frequency and completed toileting x2 during session.  Pt completed bed mobility and sit > stand with supervision with RW.  Pt completed short distance ambulation in room with RW with supervision.  Pt tolerating standing 1-2 mins during hygiene post toileting and during standing task at sink.  Pt required frequent rest breaks throughout session.  Educated on use of AE for LB dressing.  Pt able to doff and don socks with dressing stick and wide sock aid.  Pt required increased time and assist to don shoes due to donning over hospital socks, however educated on use of shoe horn when donning socks.  Engaged in dynamic standing task at sink with focus on reaching overhead and incorporating trunk rotation.  Pt requesting rest break after 90 seconds.  Completed toileting again during session as above and returned to bed to rest until PT session.  Therapy Documentation Precautions:  Precautions Precautions: Fall Precaution Comments: 2L O2 via [place] Restrictions Weight Bearing Restrictions: No General:   Vital Signs: Oxygen Therapy SpO2: 98 % O2 Device: Nasal Cannula O2 Flow Rate (L/min): 2 L/min Pain: Pain Assessment Pain Scale: 0-10 Pain Score: 0-No pain Faces Pain  Scale: No hurt   Therapy/Group: Individual Therapy  Simonne Come 09/09/2019, 12:18 PM

## 2019-09-09 NOTE — Progress Notes (Signed)
Occupational Therapy Session Note  Patient Details  Name: [name] MRN: [id number] Date of Birth: Sep 17, 1970  Today's Date: 09/09/2019 OT Individual Time: 1300-1400 OT Individual Time Calculation (min): 60 min    Short Term Goals: Week 2:  OT Short Term Goal 1 (Week 2): patient will complete LB bathing and dressing with AD supervision/set up OT Short Term Goal 2 (Week 2): patient will increase standing tolerance to 10 minutes OT Short Term Goal 3 (Week 2): patient will complete funcitonal transfers with CS  Skilled Therapeutic Interventions/Progress Updates:    Treatment session with focus on dynamic standing balance, endurance, and goal setting.  Pt received upright in w/c agreeable to therapy session.  Pt requested to stay in room due to urinary frequency.  Engaged in horse shoe toss activity in standing with focus on increased standing tolerance.  Pt completed all sit <> stands with supervision and dynamic standing balance with supervision.  Pt tolerated up to 5 mins standing this session.  Engaged in discussion regarding pt personal goals as she reports she is worried that her siblings will "baby" her.  Discussed current level as well as projected goal level. Educated on energy conservation strategies and delegating tasks to allow for increased independence in what she can do and allowing others to assist with tasks that are still a challenge.  Pt reports understanding.  Pt toileted mid-session. Ambulating 10' with RW to/from [facility] and completed all hygiene and clothing management with CGA and setup for supplies.  Pt left seated upright in w/c with all needs in reach.  Therapy Documentation Precautions:  Precautions Precautions: Fall Precaution Comments: 2L O2 via [place] Restrictions Weight Bearing Restrictions: No General:   Vital Signs: Therapy Vitals Temp: 98.2 F (36.8 C) Pulse Rate: 84 Resp: 16 BP: 131/67 Patient Position (if appropriate): Sitting Oxygen Therapy SpO2: 100  % O2 Device: Nasal Cannula O2 Flow Rate (L/min): 2 L/min Pain: Pain Assessment Pain Scale: 0-10 Pain Score: 0-No pain Faces Pain Scale: No hurt   Therapy/Group: Individual Therapy  Simonne Come 09/09/2019, 2:58 PM

## 2019-09-09 NOTE — Progress Notes (Signed)
[name] PHYSICAL MEDICINE & REHABILITATION PROGRESS NOTE   Subjective/Complaints: Reports that chest pain is improved. It is worse when she leans forward, and present on the right side of her chest. Denies dysuria, but states that she needs to urinate every 30 minutes which is very disruptive to her.   ROS; pt denies SOB, CP, endorses abd pain; denies N/V/D/C; denies vision changes  Objective:   No results found. Recent Labs    09/09/19 0453  WBC 6.7  HGB 7.1*  HCT 22.2*  PLT 379   Recent Labs    09/09/19 0453  NA 143  K 3.8  CL 108  CO2 23  GLUCOSE 96  BUN 9  CREATININE 1.26*  CALCIUM 8.9    Intake/Output Summary (Last 24 hours) at 09/09/2019 1015 Last data filed at 09/09/2019 [id number] Gross per 24 hour  Intake 600 ml  Output --  Net 600 ml     Physical Exam: Vital Signs Blood pressure 135/73, pulse 79, temperature 98.2 F (36.8 C), resp. rate 20, weight (!) 165 kg, last menstrual period 03/24/2017, SpO2 98 %.  Morbidly obese female with BMI of 65; sitting upright in chair.  HENT:  Head: Normocephalic and atraumatic.  Nose: Nose normal. Benkel[place] in place.  Mouth/Throat: Oropharynx is clear and moist.  Eyes:  Conjunctivae and EOM are normal.  Neck: No tracheal deviation present.  Cardiovascular: Normal rate and regular rhythm.  Respiratory:CTA B/L, however slightly decreased at bases; On O2 by [place] 2L- GI: Soft. Bowel sounds are normal. There is no rebound and no guarding.  Soft, protuberant, even less/ mildly TTP diffusely; ND, (+)  BS  Musculoskeletal:        General: Tenderness to palpation over right chest wall. Chest pain worsened with leaning forward.     Cervical back: Normal range of motion and neck supple.     Comments: B/L UEs- deltoi 4-/5, biceps 4/5, triceps 4/5, WE 4/5, grip 4/5, finger abd 4-/5 LEs- HF 2+/5, KE 2+/5, DF/PF 4-/5 Very large LEs- hard to move for examiner or pt.   Neurological:Sensation intact in all 4 extremities to light touch Skin:  Skin is warm and extremely dry, esp on arms A little healing seen under L and between breasts under skin fold- smaller openings- under R breast, no significant change- still open/skin split with yeast notable.  1 IV in L forearm- no signs of infiltration  Psychiatric:  Slow to process/respond-brighter affect about walking this AM    Assessment/Plan: 1. Functional deficits secondary to debility due to liver bleeding adenoma and DKA/AKI which require 3+ hours per day of interdisciplinary therapy in a comprehensive inpatient rehab setting.  Physiatrist is providing close team supervision and 24 hour management of active medical problems listed below.  Physiatrist and rehab team continue to assess barriers to discharge/monitor patient progress toward functional and medical goals  Care Tool:  Bathing    Body parts bathed by patient: Right arm, Left arm, Chest, Abdomen, Front perineal area, Face, Right upper leg, Left upper leg, Right lower leg, Left lower leg, Buttocks   Body parts bathed by helper: Buttocks, Right lower leg, Left lower leg     Bathing assist Assist Level: Supervision/Verbal cueing     Upper Body Dressing/Undressing Upper body dressing   What is the patient wearing?: Pull over shirt    Upper body assist Assist Level: Set up assist    Lower Body Dressing/Undressing Lower body dressing      What is the patient wearing?:  Underwear/pull up, Pants     Lower body assist Assist for lower body dressing: Minimal Assistance - Patient > 75%     Toileting Toileting    Toileting assist Assist for toileting: Minimal Assistance - Patient > 75%     Transfers Chair/bed transfer  Transfers assist     Chair/bed transfer assist level: Supervision/Verbal cueing Chair/bed transfer assistive device: Walker, Clinical biochemist   Ambulation assist   Ambulation activity did not occur: Safety/medical concerns(bilateral LE weakness, fatigue,  SOB)  Assist level: Supervision/Verbal cueing Assistive device: Walker-rolling Max distance: 80   Walk 10 feet activity   Assist  Walk 10 feet activity did not occur: Safety/medical concerns(bilateral LE weakness, fatigue, SOB)  Assist level: Supervision/Verbal cueing Assistive device: Walker-rolling   Walk 50 feet activity   Assist Walk 50 feet with 2 turns activity did not occur: Safety/medical concerns(bilateral LE weakness, fatigue, SOB)  Assist level: Supervision/Verbal cueing Assistive device: Walker-rolling    Walk 150 feet activity   Assist Walk 150 feet activity did not occur: Safety/medical concerns(bilateral LE weakness, fatigue, SOB)         Walk 10 feet on uneven surface  activity   Assist Walk 10 feet on uneven surfaces activity did not occur: Safety/medical concerns(bilateral LE weakness, fatigue, SOB)         Wheelchair     Assist Will patient use wheelchair at discharge?: Yes Type of Wheelchair: Manual Wheelchair activity did not occur: Safety/medical concerns(fatigue, SOB, decreased endurance)         Wheelchair 50 feet with 2 turns activity    Assist    Wheelchair 50 feet with 2 turns activity did not occur: Safety/medical concerns(fatigue, SOB, decreased endurance)       Wheelchair 150 feet activity     Assist  Wheelchair 150 feet activity did not occur: Safety/medical concerns(fatigue, SOB, decreased endurance)       Blood pressure 135/73, pulse 79, temperature 98.2 F (36.8 C), resp. rate 20, weight (!) 165 kg, last menstrual period 03/24/2017, SpO2 98 %.  1.  Impaired mobility, ADLs and function secondary to debility due to hemrrhagic shock due to bleeding adenoma, AKI, and DKA             -patient may  shower             -ELOS/Goals: 7-10 days/ supervision to min assist  -Continue CIR PT and OT 2.  Antithrombotics: -DVT/anticoagulation:  Mechanical: Sequential compression devices, below knee Bilateral lower  extremities  -would benefit from Lovenox- will see if possible since her size and immobility             -antiplatelet therapy: N/A 3. Pain Management: Need to educated/encourage patient to work thorough pain as > 7 days post embolization. Continue MS contin with MSIR  2/6- stopped MS contin and continued MSIR and tylenol- pt still says pain better controlled   2/8: R sided chest pain worse with leaning forward. Tenderness to palpation suggestive of costochondritis. Will add lidocaine patch to right chest wall.  4. Mood: LCSW to follow for evaluation and support.              -antipsychotic agents: N/A 5. Neuropsych: This patient is capable of making decisions on her own behalf. 6. Skin/Wound Care:  Antimicrobial fabric to absorb moisture to assist in management of MASD.  7. Fluids/Electrolytes/Nutrition: Continue Ensure max. Will add prostat for depleted protein stores.  8. Liver adenomas with bleeding s/[name] embolization: Continue to monitor  H/H. 9. OSA/OHS: Continue 2L oxygen per [place]--encourage CPAP at nights. Continue Dulera bid 10. T2DM: Hgb A1c- 8.6--> improving/sees Dr. Loanne Drilling. Continue to monitor BS ac/hs and titrate lantus as indicated--intake poor at this time. Continue Lantus with SSI  CBG (last 3)  Recent Labs    09/08/19 1636 09/08/19 2112 09/09/19 0613  GLUCAP 84 92 92    2/2- well controlled BGs  2/4- [id number]- will con't regimen  2/6-2/7- BGs 77-104- great control stil;  con't regimen  2/8: well controlled 11. Acute on chronic renal failure?: SCR improved from 3.10-->1.29  1/29- Cr 1.09 and BUN 20  2/1- Cr bounced back to 1.4- BUN 21- no Lasix/fluid pill on board- will encourage slight increase in fluid intake and recheck Wednesday.   2/3- will recheck in AM- esp since starting Diflucan  2/4- will give some IVFs for Cr of 1.5 and BUN of 23- will give NS 75cc/hour x 1L due to elevated kidney function and recheck in AM.   2/5- IVFs helpful- BUN 17; Cr 1.32  2/7- labs  tomorrow AM.   2/8: Labs personally reviewed. Cr is stable.  12. Chronic combined systolic CHF: Check weights daily. Monitor for signs of overload. On Entresto, pravastatin, Bidil and coreg.    Filed Weights   09/03/19 0500 09/07/19 0521 09/09/19 0457  Weight: (!) 194.4 kg 133 kg (!) 165 kg    1/29- will monitor weights for trend, daily  Sep 24, 2022: Weight significantly down.   1/31: no weight  2/1- no weight  2/2- Weight down by 1 Kilo overall  2/4- no weight today  2/6- Weight wrong- was 133 kg when she was 194 kg 2 days prior  2/7- no weight  2/8: weight unreliable.  13. H/o Lupus: On Plaquenil bid with prednisone. On Lyrica for pain management.   14. H/o anxiety/depression: On Wellbutrin and cymbalta with Xanax prn. - getting over recent death of brother.   09-24-22: Likely exacerbated by SOB. On [place] 2L, satting above 96%. 15. Hematuria?/Vaginal bleeding?: Nuvaring out PTA. Reports heavy bleeding off megestrol--?resume.  16. Constipation: 24-Sep-2022: patient states last BM was Monday and she feels constipated. Will order mag citrate. Patient worried about making it to bathroom on time. Advised that bedside commode will be placed next to her prior to administration.   2/1- 3 BMs in last 36 hours- last 1 at 2pm- hopefully feeling better.  17. Denuded skin under breasts- yeast?; placed WOC consult and will follow guidelines.    2/3- will add Diflucan 100 mg daily x 6 days for yeast adding to problems with yeast underneath breasts.   2/4- wounds are healing somewhat/so far.  18. Urinary frequency: 2/8: ordered UA/UC. UA personally removed and within normal limits. UC in process.   LOS: 11 days A FACE TO FACE EVALUATION WAS PERFORMED  [name] [name] 09/09/2019, 10:15 AM

## 2019-09-09 NOTE — Plan of Care (Signed)
  Problem: Consults Goal: RH GENERAL PATIENT EDUCATION Description: See Patient Education module for education specifics. Outcome: Progressing Goal: Skin Care Protocol Initiated - if Braden Score 18 or less Description: If consults are not indicated, leave blank or document N/A Outcome: Progressing Goal: Diabetes Guidelines if Diabetic/Glucose > 140 Description: If diabetic or lab glucose is > 140 mg/dl - Initiate Diabetes/Hyperglycemia Guidelines & Document Interventions  Outcome: Progressing   Problem: RH BLADDER ELIMINATION Goal: RH STG MANAGE BLADDER WITH ASSISTANCE Description: STG Manage Bladder With min/mod Assistance Outcome: Progressing   Problem: RH SKIN INTEGRITY Goal: RH STG SKIN FREE OF INFECTION/BREAKDOWN Description: Pt will be free of skin breakdown prior to DC with min/mod assist Outcome: Progressing Goal: RH STG MAINTAIN SKIN INTEGRITY WITH ASSISTANCE Description: STG Maintain Skin Integrity With min/mod Assistance. Outcome: Progressing Goal: RH STG ABLE TO PERFORM INCISION/WOUND CARE W/ASSISTANCE Description: STG Able To Perform Incision/Wound Care With mod Assistance. Outcome: Progressing   Problem: RH SAFETY Goal: RH STG ADHERE TO SAFETY PRECAUTIONS W/ASSISTANCE/DEVICE Description: STG Adhere to Safety Precautions With cues/reminders Assistance/Device. Outcome: Progressing   Problem: RH PAIN MANAGEMENT Goal: RH STG PAIN MANAGED AT OR BELOW PT'S PAIN GOAL Description: Less than 4 on 0-10 scale Outcome: Progressing

## 2019-09-10 ENCOUNTER — Encounter (HOSPITAL_COMMUNITY): Payer: Self-pay | Admitting: Physical Medicine and Rehabilitation

## 2019-09-10 ENCOUNTER — Inpatient Hospital Stay (HOSPITAL_COMMUNITY): Payer: Medicaid Other

## 2019-09-10 ENCOUNTER — Inpatient Hospital Stay (HOSPITAL_COMMUNITY): Payer: Medicaid Other | Admitting: Occupational Therapy

## 2019-09-10 LAB — PREPARE RBC (CROSSMATCH)

## 2019-09-10 LAB — URINE CULTURE

## 2019-09-10 LAB — GLUCOSE, CAPILLARY
Glucose-Capillary: 82 mg/dL (ref 70–99)
Glucose-Capillary: 91 mg/dL (ref 70–99)
Glucose-Capillary: 91 mg/dL (ref 70–99)
Glucose-Capillary: 117 mg/dL — ABNORMAL HIGH (ref 70–99)

## 2019-09-10 MED ORDER — FUROSEMIDE 10 MG/ML IJ SOLN
20.0000 mg | Freq: Once | INTRAMUSCULAR | Status: AC
  Administered 2019-09-10: 20 mg via INTRAVENOUS
  Filled 2019-09-10: qty 2

## 2019-09-10 MED ORDER — SODIUM CHLORIDE 0.9% IV SOLUTION: Freq: Once | INTRAVENOUS | Status: AC

## 2019-09-10 MED ORDER — ACETAMINOPHEN 325 MG PO TABS
650.0000 mg | ORAL_TABLET | Freq: Once | ORAL | Status: AC
  Administered 2019-09-10: 650 mg via ORAL
  Filled 2019-09-10: qty 2

## 2019-09-10 MED ORDER — IOHEXOL 300 MG/ML  SOLN
100.0000 mL | Freq: Once | INTRAMUSCULAR | Status: AC | PRN
  Administered 2019-09-10: 100 mL via INTRAVENOUS

## 2019-09-10 MED ORDER — TAMSULOSIN HCL 0.4 MG PO CAPS
0.4000 mg | ORAL_CAPSULE | Freq: Every day | ORAL | Status: DC
  Administered 2019-09-10 – 2019-09-13 (×4): 0.4 mg via ORAL
  Filled 2019-09-10 (×4): qty 1

## 2019-09-10 MED ORDER — SENNA 8.6 MG PO TABS
1.0000 | ORAL_TABLET | Freq: Two times a day (BID) | ORAL | Status: DC
  Administered 2019-09-10 – 2019-09-18 (×14): 8.6 mg via ORAL
  Filled 2019-09-10 (×17): qty 1

## 2019-09-10 MED ORDER — DIPHENHYDRAMINE HCL 25 MG PO CAPS
25.0000 mg | ORAL_CAPSULE | Freq: Once | ORAL | Status: AC
  Administered 2019-09-10: 13:00:00 25 mg via ORAL
  Filled 2019-09-10: qty 1

## 2019-09-10 NOTE — Plan of Care (Signed)
  Problem: RH Ambulation Goal: LTG Patient will ambulate in controlled environment (PT) Description: LTG: Patient will ambulate in a controlled environment, # of feet with assistance (PT). Flowsheets (Taken 09/10/2019 0819) LTG: Pt will ambulate in controlled environ  assist needed:: Supervision/Verbal cueing LTG: Ambulation distance in controlled environment: (Upgraded goal.) 100 ft using LRAD. Note: Upgraded goal due to increased activity tolerance with ambulation.

## 2019-09-10 NOTE — Patient Care Conference (Signed)
Inpatient RehabilitationTeam Conference and Plan of Care Update Date: 09/10/2019   Time: 11:30 AM    Patient Name: [name]      Medical Record Number: [id number]  Date of Birth: [birth year] Sex: Female         Room/Bed: 4W01C/4W01C-01 Payor Info: Payor: MEDICAID [place] / Plan: MEDICAID [place] ACCESS / Product Type: *No Product type* /    Admit Date/Time:  08/29/2019  6:35 PM  Primary Diagnosis:  Physical debility  Patient Active Problem List   Diagnosis Date Noted  . Pressure injury of skin 08/30/2019  . Acute on chronic combined systolic and diastolic CHF (congestive heart failure) ([place]) 08/30/2019  . Physical debility 08/29/2019  . Obesity hypoventilation syndrome ([place]) 08/23/2019  . Skin breakdown 08/23/2019  . Essential hypertension 08/23/2019  . Liver hemorrhage 08/23/2019  . Acute blood loss anemia 08/13/2019  . Hepatic adenoma   . Hemoperitoneum   . Diabetic acidosis without coma ([place])   . Multifocal pneumonia 09/23/2017  . Acute respiratory failure ([place]) 09/21/2017  . CHF exacerbation ([place]) 09/20/2017  . Costochondritis 06/06/2017  . Chest pain 06/04/2017  . Diabetes ([place]) 09/19/2015  . Radiculopathy of cervical spine   . Severe headache 03/05/2014  . Family history of malignant neoplasm of breast   . Family history of malignant neoplasm of ovary   . Intractable nausea and vomiting 11/11/2013  . Septate uterus 09/08/2013  . Diabetic gastroparesis ([place]) 04/28/2013  . Constipation due to pain medication 04/21/2013  . Odynophagia 04/04/2013  . Liver masses 02/28/2013  . Reflux esophagitis 02/26/2013  . Morbid obesity ([place]) 10/10/2012  . Chronic diastolic CHF (congestive heart failure) ([place]) 10/09/2012  . Chronic respiratory failure with hypoxia ([place]) 10/09/2012  . Clinical polymyositis syndrome ([place]) 09/06/2012  . Mononeuritis of lower limb 01/12/2012  . HTN (hypertension) 11/08/2011  . Systolic CHF, acute on chronic ([place]) 11/08/2011  . HLD (hyperlipidemia)  07/19/2011  . Bell's palsy 08/09/2010  . SLE 08/09/2010  . [place] SYNDROME 08/09/2010    Expected Discharge Date: Expected Discharge Date: 09/13/19  Team Members Present: Physician leading conference: Dr. [name] Social Worker Present: Lennart Pall, LCSW Nurse Present: Ellison Carwin, LPN Case Manager: Karene Fry, RN PT Present: Apolinar Junes, PT OT Present: Elisabeth Most, OT SLP Present: Jettie Booze, CF-SLP PPS Coordinator present : Ileana Ladd, Burna Mortimer, SLP     Current Status/Progress Goal Weekly Team Focus  Bowel/Bladder   continent of bowel & bladder, LBM 2/5  remain continent  asist as needed & monitor for changes   Swallow/Nutrition/ Hydration             ADL's   Set up UB adl, min A LB adl with ADs- seated edge of bed or w/c, CS for sit to stand and SPT with bari RW, supine to sit CS, sit to supine min A  supervision  family education, adl and transfer training, endurance   Mobility   Bed mobility min A for lifting LEs into the bed, transfers with RW CGA-supervision, CGA-close supervision gait 95' with RW, CGA 4-6" steps with B rails  Independent bed mobiltiy, supervision transfers and gait 100 ft using LRAD, 8 steps with 2 rails with CGA  Functional mobility/transfers, bed mobility, ambulation, LE strength, balance, endurance, d/c planning, patient/caregiver education   Communication             Safety/Cognition/ Behavioral Observations            Pain   no c/o paij during shift, has  tylenol, Ms contin, lyrica & topamax, voltaren gel scheduled, has tramadol prn  pain scale <3/10  assess & treat as needed   Skin   2 healing stage 2s to bil buttocks, ITD under bil breasts with calcium alginate & interdry  no new areas of skin break down  assess q shift    Rehab Goals Patient on target to meet rehab goals: Yes *See Care Plan and progress notes for long and short-term goals.     Barriers to Discharge  Current Status/Progress Possible  Resolutions Date Resolved   Nursing  Wound Care               PT  Home environment access/layout  Has 7 STE with B rails and 24/7 supervision at d/c from her sister and friends/family.  Currently only able to to perform 4 steps with B rails with CGA, limited by decreased activity tolerance.           OT                  SLP                SW                Discharge Planning/Teaching Needs:  Pt to return home with friend who, along with family, will provide 24/7 assistance.  Teaching needs TBD   Team Discussion: Hgb trending down, Hgb 7.1, will transfuse 1 unit, CT abd and pelvis with contrast ordered, urinary freqency q 30 mins, will try flomax, monitoring creatinine.  RN A+O, abd pain, cont B/B, urgency/frequency, low grade temp, BM 2/5, yeast under breasts.  OT bed S, min A into bed, transfers close S, UB D set up/S, LB D min A.  PT min bed, transfers S, amb 95' on O@ 2L, S goals.  Has sister, family, and friends to assist, has 7 steps into home, has practiced 4 steps.   Revisions to Treatment Plan: N/A     Medical Summary Current Status: c/o abd pain still; continent- urinary urgency; frequency as well; sacrum looks good- moisture Janeice Robinson under breasts Weekly Focus/Goal: OT- superviison bed; close SBA transfers; UB/grooming s/u; LB min assist with AD  Barriers to Discharge: Decreased family/caregiver support;Weight;Home enviroment access/layout;Medical stability;Wound care  Barriers to Discharge Comments: Anemia which could be to bleeding from liver adenoma; Possible Resolutions to Barriers: PT- good progress- hard worker- fatigues quickly; bed supervison to min assist; walking 95 ft RW 2L >94%; wean if possible; Supervison goals; 7 STE home- has done 4 steps so far.   Continued Need for Acute Rehabilitation Level of Care: The patient requires daily medical management by a physician with specialized training in physical medicine and rehabilitation for the following  reasons: Direction of a multidisciplinary physical rehabilitation program to maximize functional independence : Yes Medical management of patient stability for increased activity during participation in an intensive rehabilitation regime.: Yes Analysis of laboratory values and/or radiology reports with any subsequent need for medication adjustment and/or medical intervention. : Yes   I attest that I was present, lead the team conference, and concur with the assessment and plan of the team.   Retta Diones 09/10/2019, 7:50 PM   Team conference was held via web/ teleconference due to [place] - 19

## 2019-09-10 NOTE — Progress Notes (Signed)
Physical Therapy Session Note  Patient Details  Name: [name] MRN: [id number] Date of Birth: 1971-05-10  Today's Date: 09/10/2019 PT Individual Time: 1420-1505 PT Individual Time Calculation (min): 45 min   Short Term Goals: Week 1:  PT Short Term Goal 1 (Week 1): Pt will perform bed mobility mod A PT Short Term Goal 1 - Progress (Week 1): Met PT Short Term Goal 2 (Week 1): Pt will transfer stand<>pivot with LRAD max A PT Short Term Goal 2 - Progress (Week 1): Met PT Short Term Goal 3 (Week 1): Pt will perform car transfer with LRAD max A PT Short Term Goal 3 - Progress (Week 1): Met Week 2:  PT Short Term Goal 1 (Week 2): STG=LTG due to ELOS.  Skilled Therapeutic Interventions/Progress Updates:     Patient in bed upon PT arrival. Patient alert and agreeable to PT session. Patient reported 6/10 R abdominal to back pain during session, RN made aware. PT provided repositioning, rest breaks, and distraction as pain interventions throughout session. Blood transfusion running during session, per RN patient able to participate in OOB activity as tolerated. Vitals: BP 138/72, HR 81, SPO2 100% on 2L/min. Patient expressed increased anxiety about changes in her blood levels and need for abdominal CT. Comforted patient and educated her about recent fatigue that will hopefully improve with a transfusion and tests to determine why her hemoglobin dropped. Encouraged her to wait for answers before jumping to conclusions. Educated on coping strategies and stress management with diaphragmatic breathing and limiting stressors. She requested to void and transferred to/from the [facility] with supervision using the RW, performed supine to sit with supervision with use of bed rail. She sat on the [facility] for ~10 min to void. Encouraged her to stay on the commode until she had completely emptied her bladder. Provided cues for activation and relaxation of pelvic floor to encourage emptying while on the commode. She required  supervision for peri-care and LB dressing during toileting. She then performed marching x30 sec with cues for increased step height for activity tolerance and standing tolerance. She reported significant fatigue after and required 4 min in sitting to rest then requested to return to lying down. Required min A for LE management to go from sitting to supine. Provided cues for bringing B knees to chest for increased abdominal activation. Educated patient about elevating HOB for improved breathing when in the bed and possible devices to assist with this at home in a flat bed. Patient in bed at end of session with breaks locked, bed alarm set, and all needs within reach.    Therapy Documentation Precautions:  Precautions Precautions: Fall Precaution Comments: 2L O2 via [place] Restrictions Weight Bearing Restrictions: No General: PT Amount of Missed Time (min): 30 Minutes PT Missed Treatment Reason: Patient fatigue    Therapy/Group: Individual Therapy  Amarah Brossman L Moselle Rister PT, DPT  09/10/2019, 3:40 PM

## 2019-09-10 NOTE — Progress Notes (Signed)
Occupational Therapy Session Note  Patient Details  Name: [name] MRN: [id number] Date of Birth: 04/29/1971  Today's Date: 09/10/2019 OT Individual Time: [id number] OT Individual Time Calculation (min): 57 min    Short Term Goals: Week 2:  OT Short Term Goal 1 (Week 2): patient will complete LB bathing and dressing with AD supervision/set up OT Short Term Goal 2 (Week 2): patient will increase standing tolerance to 10 minutes OT Short Term Goal 3 (Week 2): patient will complete funcitonal transfers with CS  Skilled Therapeutic Interventions/Progress Updates:    Patient in bed, alert and ready for therapy session.  MD informed patient of low hemoglobin and reviewed recommendation for transfusion.  Patient demonstrates good understanding with appropriate questions during therapy session.  Reviewed fatigue associated with low hemoglobin.  She continues to want to participate as tolerated.  Supine to sitting with CS.  SPT with RW to commode with CS.  toileting completed with CS.  SPT to w/c with CS.  She is able to retrieve clothing w/c level - min A to maneuver w/c.  Completed bathing and grooming tasks w/c level with set up.  UB dressing S, LB dressing min A.  Completed light UB exercises w/c level and returned to bed at close of session with min A for legs into bed.    Therapy Documentation Precautions:  Precautions Precautions: Fall Precaution Comments: 2L O2 via [place] Restrictions Weight Bearing Restrictions: No General:   Vital Signs:  Pain: Pain Assessment Pain Scale: 0-10 Pain Score: 0-No pain   Other Treatments:     Therapy/Group: Individual Therapy  Carlos Levering 09/10/2019, 12:21 PM

## 2019-09-10 NOTE — Progress Notes (Signed)
[place] PHYSICAL MEDICINE & REHABILITATION PROGRESS NOTE   Subjective/Complaints:  No BM since 2/5- needs scheduled bowel meds.  Also, still going every 30 minutes or so- feels like not fully emptying when she goes. Going small amounts each time.    Will add Senokot 1 tab BID as well as Flomax- since think she's not emptying.    Pts Hb 7.1- will d/w Possible transfusion with pt and she what she wants to do.  Is c/w feeling lethargic and when she walks, cannot go very far due to SOB.    ROS; pt denies SOB, CP, endorses abd pain; denies N/V/D/C; denies vision changes;   Objective:   No results found. Recent Labs    09/09/19 0453  WBC 6.7  HGB 7.1*  HCT 22.2*  PLT 379   Recent Labs    09/09/19 0453  NA 143  K 3.8  CL 108  CO2 23  GLUCOSE 96  BUN 9  CREATININE 1.26*  CALCIUM 8.9    Intake/Output Summary (Last 24 hours) at 09/10/2019 0913 Last data filed at 09/10/2019 0830 Gross per 24 hour  Intake 600 ml  Output 1200 ml  Net -600 ml     Physical Exam: Vital Signs Blood pressure (!) 145/76, pulse 83, temperature 100 F (37.8 C), resp. rate 18, height 5\' 8"  (1.727 m), weight (!) 165 kg, last menstrual period 03/24/2017, SpO2 100 %.  Morbidly obese female with BMI of 65; sitting up in bed; nursing in room giving meds, NAD  HENT:  Head: Normocephalic and atraumatic.  Nose: Nose normal. [place] in place.  Mouth/Throat: Oropharynx is clear and moist.  Eyes:  Conjunctivae and EOM are normal.  Neck: No tracheal deviation present.  Cardiovascular: Normal rate and regular rhythm.  Respiratory:CTA B/L, however slightly decreased at bases; On O2 by [place] 2L- GI: Soft. Bowel sounds are normal. There is no rebound and no guarding.  Soft, protuberant, even less/ mildly TTP diffusely; ND, (+)  BS  Musculoskeletal:        General: Tenderness to palpation over right chest wall. Chest pain worsened with leaning forward.     Cervical back: Normal range of motion and neck supple.      Comments: B/L UEs- deltoi 4-/5, biceps 4/5, triceps 4/5, WE 4/5, grip 4/5, finger abd 4-/5 LEs- HF 2+/5, KE 2+/5, DF/PF 4-/5 Very large LEs- hard to move for examiner or pt.   Neurological:Sensation intact in all 4 extremities to light touch Skin: Skin is warm and extremely dry, esp on arms A little healing seen under L and between breasts under skin fold- smaller openings- under R breast, no significant change- still open/skin split with yeast notable.  1 IV in L forearm- no signs of infiltration  Psychiatric:  Slow to process/respond-brighter affect about walking this AM    Assessment/Plan: 1. Functional deficits secondary to debility due to liver bleeding adenoma and DKA/AKI which require 3+ hours per day of interdisciplinary therapy in a comprehensive inpatient rehab setting.  Physiatrist is providing close team supervision and 24 hour management of active medical problems listed below.  Physiatrist and rehab team continue to assess barriers to discharge/monitor patient progress toward functional and medical goals  Care Tool:  Bathing    Body parts bathed by patient: Right arm, Left arm, Chest, Abdomen, Front perineal area, Face, Right upper leg, Left upper leg, Right lower leg, Left lower leg, Buttocks   Body parts bathed by helper: Buttocks, Right lower leg, Left lower leg  Bathing assist Assist Level: Supervision/Verbal cueing     Upper Body Dressing/Undressing Upper body dressing   What is the patient wearing?: Pull over shirt    Upper body assist Assist Level: Set up assist    Lower Body Dressing/Undressing Lower body dressing      What is the patient wearing?: Underwear/pull up, Pants     Lower body assist Assist for lower body dressing: Minimal Assistance - Patient > 75%     Toileting Toileting    Toileting assist Assist for toileting: Contact Guard/Touching assist     Transfers Chair/bed transfer  Transfers assist     Chair/bed transfer  assist level: Supervision/Verbal cueing Chair/bed transfer assistive device: Programmer, multimedia   Ambulation assist   Ambulation activity did not occur: Safety/medical concerns(bilateral LE weakness, fatigue, SOB)  Assist level: Supervision/Verbal cueing Assistive device: Walker-rolling Max distance: 95'   Walk 10 feet activity   Assist  Walk 10 feet activity did not occur: Safety/medical concerns(bilateral LE weakness, fatigue, SOB)  Assist level: Supervision/Verbal cueing Assistive device: Walker-rolling   Walk 50 feet activity   Assist Walk 50 feet with 2 turns activity did not occur: Safety/medical concerns(bilateral LE weakness, fatigue, SOB)  Assist level: Supervision/Verbal cueing Assistive device: Walker-rolling    Walk 150 feet activity   Assist Walk 150 feet activity did not occur: Safety/medical concerns(bilateral LE weakness, fatigue, SOB)         Walk 10 feet on uneven surface  activity   Assist Walk 10 feet on uneven surfaces activity did not occur: Safety/medical concerns(bilateral LE weakness, fatigue, SOB)         Wheelchair     Assist Will patient use wheelchair at discharge?: Yes Type of Wheelchair: Manual Wheelchair activity did not occur: Safety/medical concerns(fatigue, SOB, decreased endurance)         Wheelchair 50 feet with 2 turns activity    Assist    Wheelchair 50 feet with 2 turns activity did not occur: Safety/medical concerns(fatigue, SOB, decreased endurance)       Wheelchair 150 feet activity     Assist  Wheelchair 150 feet activity did not occur: Safety/medical concerns(fatigue, SOB, decreased endurance)       Blood pressure (!) 145/76, pulse 83, temperature 100 F (37.8 C), resp. rate 18, height 5\' 8"  (1.727 m), weight (!) 165 kg, last menstrual period 03/24/2017, SpO2 100 %.  1.  Impaired mobility, ADLs and function secondary to debility due to hemrrhagic shock due to bleeding  adenoma, AKI, and DKA             -patient may  shower             -ELOS/Goals: 7-10 days/ supervision to min assist  -Continue CIR PT and OT 2.  Antithrombotics: -DVT/anticoagulation:  Mechanical: Sequential compression devices, below knee Bilateral lower extremities  -would benefit from Lovenox- will see if possible since her size and immobility             -antiplatelet therapy: N/A 3. Pain Management: Need to educated/encourage patient to work thorough pain as > 7 days post embolization. Continue MS contin with MSIR  2/6- stopped MS contin and continued MSIR and tylenol- pt still says pain better controlled   2/8: R sided chest pain worse with leaning forward. Tenderness to palpation suggestive of costochondritis. Will add lidocaine patch to right chest wall.  4. Mood: LCSW to follow for evaluation and support.              -  antipsychotic agents: N/A 5. Neuropsych: This patient is capable of making decisions on her own behalf. 6. Skin/Wound Care:  Antimicrobial fabric to absorb moisture to assist in management of MASD.  7. Fluids/Electrolytes/Nutrition: Continue Ensure max. Will add prostat for depleted protein stores.  8. Liver adenomas with bleeding s/p embolization: Continue to monitor H/H. 9. OSA/OHS: Continue 2L oxygen per [place]--encourage CPAP at nights. Continue Dulera bid 10. T2DM: Hgb A1c- 8.6--> improving/sees Dr. Loanne Drilling. Continue to monitor BS ac/hs and titrate lantus as indicated--intake poor at this time. Continue Lantus with SSI  CBG (last 3)  Recent Labs    09/09/19 1615 09/09/19 2109 09/10/19 0648  GLUCAP 101* 88 82    2/2- well controlled BGs  2/4- [id number]- will con't regimen  2/6-2/7- BGs 77-104- great control stil;  con't regimen  2/8: well controlled 11. Acute on chronic renal failure?: SCR improved from 3.10-->1.29  1/29- Cr 1.09 and BUN 20  2/1- Cr bounced back to 1.4- BUN 21- no Lasix/fluid pill on board- will encourage slight increase in fluid intake and  recheck Wednesday.   2/3- will recheck in AM- esp since starting Diflucan  2/4- will give some IVFs for Cr of 1.5 and BUN of 23- will give NS 75cc/hour x 1L due to elevated kidney function and recheck in AM.   2/5- IVFs helpful- BUN 17; Cr 1.32  2/7- labs tomorrow AM.   2/8: Labs personally reviewed. Cr is stable.  12. Chronic combined systolic CHF: Check weights daily. Monitor for signs of overload. On Entresto, pravastatin, Bidil and coreg.    Filed Weights   09/03/19 0500 09/07/19 0521 09/09/19 0457  Weight: (!) 194.4 kg 133 kg (!) 165 kg    1/29- will monitor weights for trend, daily  09-01-22: Weight significantly down.   1/31: no weight  2/1- no weight  2/2- Weight down by 1 Kilo overall  2/4- no weight today  2/6- Weight wrong- was 133 kg when she was 194 kg 2 days prior  2/7- no weight  2/8: weight unreliable.  13. H/o Lupus: On Plaquenil bid with prednisone. On Lyrica for pain management.   14. H/o anxiety/depression: On Wellbutrin and cymbalta with Xanax prn. - getting over recent death of brother.   2022/09/01: Likely exacerbated by SOB. On [place] 2L, satting above 96%. 15. Hematuria?/Vaginal bleeding?: Nuvaring out PTA. Reports heavy bleeding off megestrol--?resume.  16. Constipation: 2022-09-01: patient states last BM was Monday and she feels constipated. Will order mag citrate. Patient worried about making it to bathroom on time. Advised that bedside commode will be placed next to her prior to administration.   2/1- 3 BMs in last 36 hours- last 1 at 2pm- hopefully feeling better.  2/9- all Senokot 1 tab BID and give Miralax- LBM 2/5.   17. Denuded skin under breasts- yeast?; placed WOC consult and will follow guidelines.    2/3- will add Diflucan 100 mg daily x 6 days for yeast adding to problems with yeast underneath breasts.   2/4- wounds are healing somewhat/so far.  18. Urinary frequency: 2/8: ordered UA/UC. UA personally removed and within normal limits. UC in process.   2/9- will try  Flomax 0.4 mg QHS to see if will help with possible urinary retention.  19. ABLAnemia  2/9- Hb down to 7.1- just continues to trend down- will d/w pt about transfusion.     LOS: 12 days A FACE TO FACE EVALUATION WAS PERFORMED  Graciemae Delisle 09/10/2019, 9:13 AM

## 2019-09-10 NOTE — Progress Notes (Signed)
Physical Therapy Session Note  Patient Details  Name: [name] MRN: [id number] Date of Birth: 13-Mar-1971  Today's Date: 09/10/2019 PT Individual Time: 1108-1202 PT Individual Time Calculation (min): 54 min   Short Term Goals: Week 2:  PT Short Term Goal 1 (Week 2): STG=LTG due to ELOS.  Skilled Therapeutic Interventions/Progress Updates:    Pt reporting plan for transfusion after therapies today. Agreeable to therapy session to tolerance and encouraged continued mobility throughout the day. Currently patient just reporting overall fatigue. Denies any symptoms of dizziness/lightheadedness. More rest breaks given for recovery time due to increased fatigue.  Performed bed mobility for supine to sit using rails with supervision. Engaged in standing therex for functional strengthening and balance training with decreasing reliance on UE support including: Standing heel raises x 15 reps x 2 sets Standing toe raises x 15 reps x 2 sets Standing hip abduction x 15 reps x 2 sets Standing mini squats x 15 reps x 2 sets Standing marches x 15 reps x 2 sets  Pt with reported urine urgency and requires use of BSC during session with overall supervision for transfer and performing hygiene using RW for support and balance.  Requires mod assist to return back to bed after supervision transfer due to BLE management needed.   Therapy Documentation Precautions:  Precautions Precautions: Fall Precaution Comments: 2L O2 via [place] Restrictions Weight Bearing Restrictions: No  Pain: Pain Assessment Pain Scale: 0-10 Pain Score: 0-No pain   Therapy/Group: Individual Therapy  Canary Brim Ivory Broad, PT, DPT, CBIS  09/10/2019, 12:20 PM

## 2019-09-11 ENCOUNTER — Inpatient Hospital Stay (HOSPITAL_COMMUNITY): Payer: Medicaid Other

## 2019-09-11 ENCOUNTER — Telehealth: Payer: Self-pay | Admitting: *Deleted

## 2019-09-11 ENCOUNTER — Encounter (HOSPITAL_COMMUNITY): Payer: Self-pay | Admitting: Physical Medicine and Rehabilitation

## 2019-09-11 ENCOUNTER — Inpatient Hospital Stay (HOSPITAL_COMMUNITY): Payer: Medicaid Other | Admitting: Occupational Therapy

## 2019-09-11 LAB — CBC WITH DIFFERENTIAL/PLATELET
Abs Immature Granulocytes: 0.02 10*3/uL (ref 0.00–0.07)
Basophils Absolute: 0 10*3/uL (ref 0.0–0.1)
Basophils Relative: 1 %
Eosinophils Absolute: 0.4 10*3/uL (ref 0.0–0.5)
Eosinophils Relative: 5 %
HCT: 31.2 % — ABNORMAL LOW (ref 36.0–46.0)
Hemoglobin: 9.8 g/dL — ABNORMAL LOW (ref 12.0–15.0)
Immature Granulocytes: 0 %
Lymphocytes Relative: 30 %
Lymphs Abs: 2.4 10*3/uL (ref 0.7–4.0)
MCH: 27.5 pg (ref 26.0–34.0)
MCHC: 31.4 g/dL (ref 30.0–36.0)
MCV: 87.4 fL (ref 80.0–100.0)
Monocytes Absolute: 0.8 10*3/uL (ref 0.1–1.0)
Monocytes Relative: 10 %
Neutro Abs: 4.3 10*3/uL (ref 1.7–7.7)
Neutrophils Relative %: 54 %
Platelets: 420 10*3/uL — ABNORMAL HIGH (ref 150–400)
RBC: 3.57 MIL/uL — ABNORMAL LOW (ref 3.87–5.11)
RDW: 15.5 % (ref 11.5–15.5)
WBC: 8 10*3/uL (ref 4.0–10.5)
nRBC: 0 % (ref 0.0–0.2)

## 2019-09-11 LAB — TYPE AND SCREEN
ABO/RH(D): O POS
Antibody Screen: NEGATIVE
Unit division: 0

## 2019-09-11 LAB — GLUCOSE, CAPILLARY
Glucose-Capillary: 98 mg/dL (ref 70–99)
Glucose-Capillary: 99 mg/dL (ref 70–99)
Glucose-Capillary: 115 mg/dL — ABNORMAL HIGH (ref 70–99)
Glucose-Capillary: 96 mg/dL (ref 70–99)

## 2019-09-11 LAB — COMPREHENSIVE METABOLIC PANEL
ALT: 24 U/L (ref 0–44)
AST: 27 U/L (ref 15–41)
Albumin: 2.9 g/dL — ABNORMAL LOW (ref 3.5–5.0)
Alkaline Phosphatase: 118 U/L (ref 38–126)
Anion gap: 10 (ref 5–15)
BUN: 12 mg/dL (ref 6–20)
CO2: 21 mmol/L — ABNORMAL LOW (ref 22–32)
Calcium: 9.5 mg/dL (ref 8.9–10.3)
Chloride: 111 mmol/L (ref 98–111)
Creatinine, Ser: 1.26 mg/dL — ABNORMAL HIGH (ref 0.44–1.00)
GFR calc Af Amer: 58 mL/min — ABNORMAL LOW (ref >60)
GFR calc non Af Amer: 50 mL/min — ABNORMAL LOW (ref >60)
Glucose, Bld: 121 mg/dL — ABNORMAL HIGH (ref 70–99)
Potassium: 3.8 mmol/L (ref 3.5–5.1)
Sodium: 142 mmol/L (ref 135–145)
Total Bilirubin: 0.6 mg/dL (ref 0.3–1.2)
Total Protein: 7.3 g/dL (ref 6.5–8.1)

## 2019-09-11 LAB — BPAM RBC
Blood Product Expiration Date: 202103112359
ISSUE DATE / TIME: 202102091339
Unit Type and Rh: 5100

## 2019-09-11 LAB — OCCULT BLOOD X 1 CARD TO LAB, STOOL: Fecal Occult Bld: POSITIVE — AB

## 2019-09-11 NOTE — Progress Notes (Signed)
Occupational Therapy Session Note  Patient Details  Name: [name] MRN: [id number] Date of Birth: 02-Sep-1970  Today's Date: 09/11/2019 OT Individual Time: [phone] OT Individual Time Calculation (min): 69 min    Short Term Goals: Week 2:  OT Short Term Goal 1 (Week 2): patient will complete LB bathing and dressing with AD supervision/set up OT Short Term Goal 2 (Week 2): patient will increase standing tolerance to 10 minutes OT Short Term Goal 3 (Week 2): patient will complete funcitonal transfers with CS  Skilled Therapeutic Interventions/Progress Updates:    Upon  entering the room, pt supine in bed with covers pulled and reports, " I just can't get cold and I feel bad". RN also present in the room and is aware. OT checking pt's temp with it being 98.7. Pt requesting to use bathroom with supervision for bed mobility and min guard stand pivot transfer without use of AD onto BSC. Pt able to void this session and performed hygiene while standing with close supervision. Pt's teeth chattering and continues to report feeling very cold. Pt donning pants while seated on commode with min guard for balance when standing to pull over B hips. Pt transferred back into bed and covered up. She was agreeable to B UE strengthening exercises with use of 5 lbs resistive ball with min cuing as needed for technique. Rest breaks as needed this session. Pt remained in bed at end of session with call bell and all needed items within reach.   Therapy Documentation Precautions:  Precautions Precautions: Fall Precaution Comments: 2L O2 via [place] Restrictions Weight Bearing Restrictions: No General: General PT Missed Treatment Reason: CT/MRI Vital Signs:  Pain:   ADL: ADL Eating: Supervision/safety, Set up Where Assessed-Eating: Bed level Grooming: Minimal assistance Where Assessed-Grooming: Edge of bed Upper Body Bathing: Moderate assistance Where Assessed-Upper Body Bathing: Edge of bed Lower Body  Bathing: Dependent Where Assessed-Lower Body Bathing: Edge of bed Upper Body Dressing: Minimal assistance Where Assessed-Upper Body Dressing: Edge of bed Lower Body Dressing: Dependent Where Assessed-Lower Body Dressing: Edge of bed Toileting: Dependent   Therapy/Group: Individual Therapy  Gypsy Decant 09/11/2019, 1:55 PM

## 2019-09-11 NOTE — Telephone Encounter (Signed)
Dr. Thornell Sartorius, OB-GYN wanted to speak to Dr. Dagoberto Ligas about patient.

## 2019-09-11 NOTE — Progress Notes (Signed)
[name] PHYSICAL MEDICINE & REHABILITATION PROGRESS NOTE   Subjective/Complaints:   Pt reports she doesn't feel for than yesterday but does not she feels very cold and fatigued. - so cold her teeth were chattering.     ROS; pt denies SOB, CP, endorses abd pain; denies N/V/D/C; denies vision changes;   Objective:   CT ABDOMEN PELVIS W WO CONTRAST  Result Date: 09/11/2019 CLINICAL DATA:  Inpatient. Recent hospitalization for hemorrhagic liver adenomas reportedly recently embolized at outside facility. Worsening anemia. EXAM: CT ABDOMEN AND PELVIS WITHOUT AND WITH CONTRAST TECHNIQUE: Multidetector CT imaging of the abdomen and pelvis was performed following the standard protocol before and following the bolus administration of intravenous contrast. CONTRAST:  181mL OMNIPAQUE IOHEXOL 300 MG/ML  SOLN COMPARISON:  08/12/2019 CT abdomen/pelvis. FINDINGS: Lower chest: No significant pulmonary nodules or acute consolidative airspace disease. Hepatobiliary: There are multiple irregular low-attenuation liver masses scattered throughout the liver without compelling evidence of enhancement, decreased in size in the interval. Representative 5.8 x 5.8 cm segment 6 right liver mass (series 11/image 44), previously 7.6 x 6.2 cm. Representative 10.7 x 4.5 cm central liver mass (series 11/image 26), previously 13.4 x 5.1 cm. No appreciable new liver masses. Normal gallbladder with no radiopaque cholelithiasis. No biliary ductal dilatation. Pancreas: Normal, with no mass or duct dilation. Spleen: Normal size. No mass. Adrenals/Urinary Tract: Normal adrenals. No renal stones. No hydronephrosis. Normal bladder. Stomach/Bowel: Normal non-distended stomach. Normal caliber small bowel with no small bowel wall thickening. Normal appendix. Normal large bowel with no diverticulosis, large bowel wall thickening or pericolonic fat stranding. Vascular/Lymphatic: Normal caliber abdominal aorta. Patent portal, splenic, hepatic  and renal veins. No pathologically enlarged lymph nodes in the abdomen or pelvis. Reproductive: Grossly normal uterus. There is an ill-defined 11.4 x 2.9 cm focus of soft tissue density draping along anterior uterus in the anterior pelvis (series 11/image 107), correlating to the site of hemoperitoneum on the prior scan. Other: No pneumoperitoneum. No perihepatic hemoperitoneum. No abdominal ascites. No abdominal fluid collections. No retroperitoneal hematoma. Musculoskeletal: No aggressive appearing focal osseous lesions. IMPRESSION: 1. Low-attenuation liver masses have decreased in size since 08/12/2019 CT and demonstrate no compelling evidence of enhancement, compatible with reported interval embolization of known liver adenomas. No perihepatic hemoperitoneum to suggest acute hemorrhage of liver lesions. 2. Indeterminate ill-defined 11.4 x 2.9 cm focus of soft tissue density draping along the anterior uterus in the anterior pelvis, correlating to the site of hemoperitoneum on the prior CT study. Findings may represent residual subacute hemoperitoneum in the pelvis. Strictly speaking, an adnexal mass cannot be excluded, but is less favored. Suggest attention on follow-up CT or MRI pelvis without and with IV contrast and with oral contrast in 3 months. Electronically Signed   By: Ilona Sorrel M.D.   On: 09/11/2019 08:09   Recent Labs    09/09/19 0453 09/11/19 0645  WBC 6.7 8.0  HGB 7.1* 9.8*  HCT 22.2* 31.2*  PLT 379 420*   Recent Labs    09/09/19 0453 09/11/19 0645  NA 143 142  K 3.8 3.8  CL 108 111  CO2 23 21*  GLUCOSE 96 121*  BUN 9 12  CREATININE 1.26* 1.26*  CALCIUM 8.9 9.5    Intake/Output Summary (Last 24 hours) at 09/11/2019 0849 Last data filed at 09/11/2019 0645 Gross per 24 hour  Intake 30 ml  Output 202 ml  Net -172 ml     Physical Exam: Vital Signs Blood pressure (!) 160/90, pulse (!) 102, temperature  98.6 F (37 C), resp. rate 20, height 5\' 8"  (1.727 m), weight (!)  165 kg, last menstrual period 03/24/2017, SpO2 95 %.  Morbidly obese female with BMI of 65; sitting up in bed; working with OT and medicine ball;  C/o feeling tired/cold, NAD  HENT:  Head: Normocephalic and atraumatic.  Nose: Nose normal. [place] in place.  Mouth/Throat: Oropharynx is clear and moist.  Eyes:  Conjunctivae and EOM are normal.  Neck: No tracheal deviation present.  Cardiovascular: Normal rate and regular rhythm.  Respiratory:CTA B/L, however slightly decreased at bases still ; On O2 by [place] 2L- GI: Soft. Bowel sounds are normal. There is no rebound and no guarding.  Soft, protuberant, even less/ mildly TTP diffusely; ND, (+)  BS  Musculoskeletal:        Cervical back: Normal range of motion and neck supple.     Comments: B/L UEs- deltoi 4-/5, biceps 4/5, triceps 4/5, WE 4/5, grip 4/5, finger abd 4-/5 LEs- HF 2+/5, KE 2+/5, DF/PF 4-/5 Very large LEs- hard to move for examiner or pt.   Neurological:Sensation intact in all 4 extremities to light touch Skin: Skin is warm and extremely dry, esp on arms A little healing seen under L and between breasts under skin fold- smaller openings- under R breast, no significant change- still open/skin split with yeast notable.  1 IV in L forearm- no signs of infiltration  Psychiatric:  Slow to process/respond-brighter affect about walking this AM    Assessment/Plan: 1. Functional deficits secondary to debility due to liver bleeding adenoma and DKA/AKI which require 3+ hours per day of interdisciplinary therapy in a comprehensive inpatient rehab setting.  Physiatrist is providing close team supervision and 24 hour management of active medical problems listed below.  Physiatrist and rehab team continue to assess barriers to discharge/monitor patient progress toward functional and medical goals  Care Tool:  Bathing    Body parts bathed by patient: Right arm, Left arm, Chest, Abdomen, Front perineal area, Face, Right upper leg, Left upper  leg, Right lower leg, Left lower leg, Buttocks   Body parts bathed by helper: Buttocks, Right lower leg, Left lower leg     Bathing assist Assist Level: Supervision/Verbal cueing     Upper Body Dressing/Undressing Upper body dressing   What is the patient wearing?: Pull over shirt    Upper body assist Assist Level: Set up assist    Lower Body Dressing/Undressing Lower body dressing      What is the patient wearing?: Underwear/pull up, Pants     Lower body assist Assist for lower body dressing: Minimal Assistance - Patient > 75%     Toileting Toileting    Toileting assist Assist for toileting: Contact Guard/Touching assist     Transfers Chair/bed transfer  Transfers assist     Chair/bed transfer assist level: Supervision/Verbal cueing Chair/bed transfer assistive device: Armrests, Programmer, multimedia   Ambulation assist   Ambulation activity did not occur: Safety/medical concerns(bilateral LE weakness, fatigue, SOB)  Assist level: Supervision/Verbal cueing Assistive device: Walker-rolling Max distance: 95'   Walk 10 feet activity   Assist  Walk 10 feet activity did not occur: Safety/medical concerns(bilateral LE weakness, fatigue, SOB)  Assist level: Supervision/Verbal cueing Assistive device: Walker-rolling   Walk 50 feet activity   Assist Walk 50 feet with 2 turns activity did not occur: Safety/medical concerns(bilateral LE weakness, fatigue, SOB)  Assist level: Supervision/Verbal cueing Assistive device: Walker-rolling    Walk 150 feet activity   Assist  Walk 150 feet activity did not occur: Safety/medical concerns(bilateral LE weakness, fatigue, SOB)         Walk 10 feet on uneven surface  activity   Assist Walk 10 feet on uneven surfaces activity did not occur: Safety/medical concerns(bilateral LE weakness, fatigue, SOB)         Wheelchair     Assist Will patient use wheelchair at discharge?: Yes Type of  Wheelchair: Manual Wheelchair activity did not occur: Safety/medical concerns(fatigue, SOB, decreased endurance)         Wheelchair 50 feet with 2 turns activity    Assist    Wheelchair 50 feet with 2 turns activity did not occur: Safety/medical concerns(fatigue, SOB, decreased endurance)       Wheelchair 150 feet activity     Assist  Wheelchair 150 feet activity did not occur: Safety/medical concerns(fatigue, SOB, decreased endurance)       Blood pressure (!) 160/90, pulse (!) 102, temperature 98.6 F (37 C), resp. rate 20, height 5\' 8"  (1.727 m), weight (!) 165 kg, last menstrual period 03/24/2017, SpO2 95 %.  1.  Impaired mobility, ADLs and function secondary to debility due to hemrrhagic shock due to bleeding adenoma, AKI, and DKA             -patient may  shower             -ELOS/Goals: 7-10 days/ supervision to min assist  -Continue CIR PT and OT 2.  Antithrombotics: -DVT/anticoagulation:  Mechanical: Sequential compression devices, below knee Bilateral lower extremities  -would benefit from Lovenox- will see if possible since her size and immobility             -antiplatelet therapy: N/A 3. Pain Management: Need to educated/encourage patient to work thorough pain as > 7 days post embolization. Continue MS contin with MSIR  2/6- stopped MS contin and continued MSIR and tylenol- pt still says pain better controlled   2/8: R sided chest pain worse with leaning forward. Tenderness to palpation suggestive of costochondritis. Will add lidocaine patch to right chest wall.  4. Mood: LCSW to follow for evaluation and support.              -antipsychotic agents: N/A 5. Neuropsych: This patient is capable of making decisions on her own behalf. 6. Skin/Wound Care:  Antimicrobial fabric to absorb moisture to assist in management of MASD.  7. Fluids/Electrolytes/Nutrition: Continue Ensure max. Will add prostat for depleted protein stores.  8. Liver adenomas with bleeding s/p  embolization: Continue to monitor H/H.  2/10- transfused 1 units; but has a anterior uterus 11/4 x3cm mass- ? Bleeding-  9. OSA/OHS: Continue 2L oxygen per [place]--encourage CPAP at nights. Continue Dulera bid 10. T2DM: Hgb A1c- 8.6--> improving/sees Dr. Loanne Drilling. Continue to monitor BS ac/hs and titrate lantus as indicated--intake poor at this time. Continue Lantus with SSI  CBG (last 3)  Recent Labs    09/10/19 1634 09/10/19 2057 09/11/19 0627  GLUCAP 91 117* 98    2/2- well controlled BGs  2/4- [id number]- will con't regimen  2/6-2/7- BGs 77-104- great control stil;  con't regimen  2/10- 91-117- con't regimen 11. Acute on chronic renal failure?: SCR improved from 3.10-->1.29  1/29- Cr 1.09 and BUN 20  2/1- Cr bounced back to 1.4- BUN 21- no Lasix/fluid pill on board- will encourage slight increase in fluid intake and recheck Wednesday.   2/3- will recheck in AM- esp since starting Diflucan  2/4- will give some IVFs for Cr of  1.5 and BUN of 23- will give NS 75cc/hour x 1L due to elevated kidney function and recheck in AM.   2/5- IVFs helpful- BUN 17; Cr 1.32  2/7- labs tomorrow AM.   2/8: Labs personally reviewed. Cr is stable.  12. Chronic combined systolic CHF: Check weights daily. Monitor for signs of overload. On Entresto, pravastatin, Bidil and coreg.    Filed Weights   09/03/19 0500 09/07/19 0521 09/09/19 0457  Weight: (!) 194.4 kg 133 kg (!) 165 kg    1/29- will monitor weights for trend, daily  09-Sep-2022: Weight significantly down.   1/31: no weight  2/1- no weight  2/2- Weight down by 1 Kilo overall  2/4- no weight today  2/6- Weight wrong- was 133 kg when she was 194 kg 2 days prior  2/7- no weight  2/8: weight unreliable.  13. H/o Lupus: On Plaquenil bid with prednisone. On Lyrica for pain management.   14. H/o anxiety/depression: On Wellbutrin and cymbalta with Xanax prn. - getting over recent death of brother.   Sep 09, 2022: Likely exacerbated by SOB. On [place] 2L, satting above  96%. 15. Hematuria?/Vaginal bleeding?: Nuvaring out PTA. Reports heavy bleeding off megestrol--?resume.  16. Constipation: 09-09-22: patient states last BM was Monday and she feels constipated. Will order mag citrate. Patient worried about making it to bathroom on time. Advised that bedside commode will be placed next to her prior to administration.   2/1- 3 BMs in last 36 hours- last 1 at 2pm- hopefully feeling better.  2/9- all Senokot 1 tab BID and give Miralax- LBM 2/5.   17. Denuded skin under breasts- yeast?; placed WOC consult and will follow guidelines.    2/3- will add Diflucan 100 mg daily x 6 days for yeast adding to problems with yeast underneath breasts.   2/4- wounds are healing somewhat/so far.  18. Urinary frequency: 2/8: ordered UA/UC. UA personally removed and within normal limits. UC in process.   2/9- will try Flomax 0.4 mg QHS to see if will help with possible urinary retention.  19. ABLAnemia  2/9- Hb down to 7.1- just continues to trend down- will d/w pt about transfusion.   2/10- 2.9 x11.4 cm mass- bleeding? Not sure where bleeding is coming from; has a hemoccult (+) as well, but CT of abd and pelvis is not c/w live  Adenomas bleeding still. Will call Gyn AND GI to see if recs.     LOS: 13 days A FACE TO FACE EVALUATION WAS PERFORMED  [name] 09/11/2019, 8:49 AM

## 2019-09-11 NOTE — Progress Notes (Signed)
Physical Therapy Session Note  Patient Details  Name: [name] MRN: [id number] Date of Birth: 1970/12/05  Today's Date: 09/11/2019 PT Individual Time: 1020-1130 PT Individual Time Calculation (min): 70 min   Short Term Goals: Week 2:  PT Short Term Goal 1 (Week 2): STG=LTG due to ELOS.  Skilled Therapeutic Interventions/Progress Updates:     Patient in bed asleep upon PT arrival. Patient easily aroused and agreeable to PT session. Patient reported 5/10 R trunk and back pain during session, RN aware. PT provided repositioning, rest breaks, and distraction as pain interventions throughout session. Discussed general CT scan results and informed her that it appears that she will need another CT to continue to determine the cause of her drop in hemoglobin. She reported that she does not feel any better after the blood transfusions and that she continues to feel extremely fatigued and cold, noted patient shivering during session. She was on on 2L/min O2, SPO2 100%, at beginning of session. Titrated to 1L/min and patient remained at 100% x2 min and when she removed the oxygen she remained at 100% on RA at rest x2 min.  Therapeutic Activity: Bed Mobility: Patient performed supine to/from sit with supervision with use of bed rails and min-mod A for lifting LEs into the bed due to increased fatigue today. Provided verbal cues for brining B knees to chest to lift LEs into the bed. She sat EOB on RA, O2 sats 94% and recovered to 97% in sitting. Reported mild dizziness sitting EOB that resolved in sitting <1 min.  Transfers: Patient performed sit to/from stand x4 and stand pivot bed<>BSC with supervision using a bariatric RW. Provided verbal cues for reaching back to sit to control descent for safey. She was continent of bowl and bladder on BSC. Discussed constipation and bladder urgency while patient was on [facility]. Patient reports improvements in both today and recalled techniques for pelvic floor activation  to promote relaxation to empty her bladder. She required set-up and supervision for peri-care and LB dressing during toileting.   Gait Training:  Patient ambulated 10 feet and 20 feet using a bariatric RW with close supervision for safety. Ambulated with decreased gait speed, step length, and height, and increased forward trunk lean due to fatigue today. Provided verbal cues for erect posture and increased foot clearance. SPO2 94-95% on RA after each trial with mild SOB that recovered with cues for pursed lip breathing <1 min with SPO2 at 98-99%. She did require increased time to rest between activity today due to increased fatigue.   Therapeutic Exercise: Patient performed the following exercises with verbal and tactile cues for proper technique. -heel/toe raises x10 in standing  Patient in bed at end of session with breaks locked and all needs within reach. Patient on RA at end of session, SPO2 at 100%, RN made aware and agreed to leave O2 off at this time and will continue to monitor patient. Placed [place] within patient's reach and educated her on putting it on and calling the nurse if she became SOB.     Therapy Documentation Precautions:  Precautions Precautions: Fall Precaution Comments: 2L O2 via [place] Restrictions Weight Bearing Restrictions: No    Therapy/Group: Individual Therapy  Petrina Melby L Jazariah Teall PT, DPT  09/11/2019, 12:55 PM

## 2019-09-11 NOTE — Progress Notes (Signed)
Patient denies any vaginal bleeding or hematuria. Spoke to Dr.Bovard's nurse this am who reported that patient has bicornuate uterus and abdominal/vaginal ultrasound recommended for work up. Dr. Melba Coon called back to discuss patient's results--patient last seen in office in 2019. Patient has hx of irregular bleeding and was using Nuvaring as well as megestrol PTA. She has had two biopsies--last in 2019 that was negative. Ultrasound (limited quality) does not show abnormality of uterine lining or masses. She doubted that anemia was due to GYN source and does not have anything additional to add at this time. She will follow up with repeat ultrasound in the office for follow up after discharge.

## 2019-09-11 NOTE — Plan of Care (Signed)
  Problem: Consults Goal: RH GENERAL PATIENT EDUCATION Description: See Patient Education module for education specifics. Outcome: Progressing Goal: Skin Care Protocol Initiated - if Braden Score 18 or less Description: If consults are not indicated, leave blank or document N/A Outcome: Progressing Goal: Diabetes Guidelines if Diabetic/Glucose > 140 Description: If diabetic or lab glucose is > 140 mg/dl - Initiate Diabetes/Hyperglycemia Guidelines & Document Interventions  Outcome: Progressing   Problem: RH BLADDER ELIMINATION Goal: RH STG MANAGE BLADDER WITH ASSISTANCE Description: STG Manage Bladder With min/mod Assistance Outcome: Progressing   Problem: RH SKIN INTEGRITY Goal: RH STG SKIN FREE OF INFECTION/BREAKDOWN Description: Pt will be free of skin breakdown prior to DC with min/mod assist Outcome: Progressing Goal: RH STG MAINTAIN SKIN INTEGRITY WITH ASSISTANCE Description: STG Maintain Skin Integrity With min/mod Assistance. Outcome: Progressing Goal: RH STG ABLE TO PERFORM INCISION/WOUND CARE W/ASSISTANCE Description: STG Able To Perform Incision/Wound Care With mod Assistance. Outcome: Progressing   Problem: RH SAFETY Goal: RH STG ADHERE TO SAFETY PRECAUTIONS W/ASSISTANCE/DEVICE Description: STG Adhere to Safety Precautions With cues/reminders Assistance/Device. Outcome: Progressing   Problem: RH PAIN MANAGEMENT Goal: RH STG PAIN MANAGED AT OR BELOW PT'S PAIN GOAL Description: Less than 4 on 0-10 scale Outcome: Progressing

## 2019-09-11 NOTE — Progress Notes (Signed)
Physical Therapy Note  Patient Details  Name: [name] MRN: [id number] Date of Birth: 07-20-1971 Today's Date: 09/11/2019    Patient off unit for CT scan upon PT arrival. Patient missed 60 min of skilled PT. Will attempt to make up missed time as able.   Shawnia Vizcarrondo L Devantae Babe PT, DPT  09/11/2019, 1:42 PM

## 2019-09-12 ENCOUNTER — Other Ambulatory Visit: Payer: Self-pay | Admitting: Cardiology

## 2019-09-12 ENCOUNTER — Inpatient Hospital Stay (HOSPITAL_COMMUNITY): Payer: Medicaid Other

## 2019-09-12 ENCOUNTER — Inpatient Hospital Stay (HOSPITAL_COMMUNITY): Payer: Medicaid Other | Admitting: Occupational Therapy

## 2019-09-12 DIAGNOSIS — K625 Hemorrhage of anus and rectum: Secondary | ICD-10-CM

## 2019-09-12 DIAGNOSIS — D638 Anemia in other chronic diseases classified elsewhere: Secondary | ICD-10-CM

## 2019-09-12 DIAGNOSIS — D134 Benign neoplasm of liver: Secondary | ICD-10-CM

## 2019-09-12 LAB — CBC WITH DIFFERENTIAL/PLATELET
Abs Immature Granulocytes: 0.01 10*3/uL (ref 0.00–0.07)
Basophils Absolute: 0 10*3/uL (ref 0.0–0.1)
Basophils Relative: 1 %
Eosinophils Absolute: 0.4 10*3/uL (ref 0.0–0.5)
Eosinophils Relative: 6 %
HCT: 25 % — ABNORMAL LOW (ref 36.0–46.0)
Hemoglobin: 8.2 g/dL — ABNORMAL LOW (ref 12.0–15.0)
Immature Granulocytes: 0 %
Lymphocytes Relative: 32 %
Lymphs Abs: 2.3 10*3/uL (ref 0.7–4.0)
MCH: 28.4 pg (ref 26.0–34.0)
MCHC: 32.8 g/dL (ref 30.0–36.0)
MCV: 86.5 fL (ref 80.0–100.0)
Monocytes Absolute: 1 10*3/uL (ref 0.1–1.0)
Monocytes Relative: 13 %
Neutro Abs: 3.6 10*3/uL (ref 1.7–7.7)
Neutrophils Relative %: 48 %
Platelets: 348 10*3/uL (ref 150–400)
RBC: 2.89 MIL/uL — ABNORMAL LOW (ref 3.87–5.11)
RDW: 15.6 % — ABNORMAL HIGH (ref 11.5–15.5)
WBC: 7.4 10*3/uL (ref 4.0–10.5)
nRBC: 0 % (ref 0.0–0.2)

## 2019-09-12 LAB — BASIC METABOLIC PANEL
Anion gap: 10 (ref 5–15)
BUN: 10 mg/dL (ref 6–20)
CO2: 23 mmol/L (ref 22–32)
Calcium: 9 mg/dL (ref 8.9–10.3)
Chloride: 109 mmol/L (ref 98–111)
Creatinine, Ser: 1.24 mg/dL — ABNORMAL HIGH (ref 0.44–1.00)
GFR calc Af Amer: 59 mL/min — ABNORMAL LOW (ref >60)
GFR calc non Af Amer: 51 mL/min — ABNORMAL LOW (ref >60)
Glucose, Bld: 90 mg/dL (ref 70–99)
Potassium: 3.6 mmol/L (ref 3.5–5.1)
Sodium: 142 mmol/L (ref 135–145)

## 2019-09-12 LAB — GLUCOSE, CAPILLARY
Glucose-Capillary: 73 mg/dL (ref 70–99)
Glucose-Capillary: 91 mg/dL (ref 70–99)
Glucose-Capillary: 92 mg/dL (ref 70–99)
Glucose-Capillary: 80 mg/dL (ref 70–99)

## 2019-09-12 MED ORDER — PRO-STAT SUGAR FREE PO LIQD
30.0000 mL | Freq: Two times a day (BID) | ORAL | Status: DC
  Administered 2019-09-12 – 2019-09-17 (×6): 30 mL via ORAL
  Filled 2019-09-12 (×13): qty 30

## 2019-09-12 MED ORDER — LEVOTHYROXINE SODIUM 75 MCG PO TABS
150.0000 ug | ORAL_TABLET | Freq: Every day | ORAL | Status: DC
  Administered 2019-09-13 – 2019-09-18 (×6): 150 ug via ORAL
  Filled 2019-09-12 (×6): qty 2

## 2019-09-12 MED ORDER — HYDROCORTISONE (PERIANAL) 2.5 % EX CREA
TOPICAL_CREAM | Freq: Two times a day (BID) | CUTANEOUS | Status: DC
  Filled 2019-09-12: qty 28.35

## 2019-09-12 NOTE — Progress Notes (Signed)
Physical Therapy Weekly Progress Note  Patient Details  Name: [name] MRN: [id number] Date of Birth: 11/16/1970  Beginning of progress report period: September 05, 2019 End of progress report period: September 12, 2019  Beginning of progress report period: September 05, 2019 End of progress report period: September 12, 2019  Today's Date: 09/12/2019 PT Individual Time: 0900-1000 PT Individual Time Calculation (min): 60 min   Patient has made good progress towards her long term goals.  She has been limited by low hemoglobin and increased urinary urgency and frequency his week causing increased fatigue during sessions. D/c has been extended into next week due to slow progress and pending medical testing. She currently requires supervision-min A for lifting LEs into the bed with bed mobility, supervision for transfer and gait up to 95 ft, limited to 20 feet when fatigued, and CGA for 4 steps using B rails. She has tolerated low intensity sessions this week on RA with O2 sats >94%.   Patient continues to demonstrate the following deficits muscle weakness, decreased cardiorespiratoy endurance and decreased oxygen support and decreased sitting balance, decreased standing balance, decreased postural control and decreased balance strategies and therefore will continue to benefit from skilled PT intervention to increase functional independence with mobility.  Patient progressing toward long term goals.. Plan of care revisions: Extended LOS due to change in medical status..  PT Short Term Goals Week 2:  PT Short Term Goal 1 (Week 2): STG=LTG due to ELOS. Week 3:  PT Short Term Goal 1 (Week 3): STG=LTG due to ELOS.  Skilled Therapeutic Interventions/Progress Updates:     Patient in bed with RN in room upon PT arrival. Patient alert and agreeable to PT session. Patient denied pain during session, however, reported increased fatigue today. Patient on 2L/min upon PT arrival, SPO2 at 100%. Removed oxygen  and patient maintained >94% throughout session on RA at rest and with activity.    Therapeutic Activity: Bed Mobility: Patient performed supine to/from sit with supervision-min A for LE managment. Provided verbal cues for bringing knees to chest to lift LEs into the bed. Transfers: Patient performed sit to/from stand from the bed and transfers with the rollator several times throughout session with supervision. Provided verbal cues for use of breaks during transfers for safety. Bathing/Dressing: Patient requested to get cleaned up this morning. She ambulated in the room, selected clothing from her drawers seated for safety, sat at the sink to wash using wash cloths, and got dressed with supervision-mod I for safety. Provided education on energy conservation techniques with ADLs and IADLs at home throughout.   Gait Training:  Patient ambulated 90 feet, 40 feet, and 50 feet with close supervision-CGA using a rollator. Ambulated with decreased gait speed, decreased step length and height, forward trunk lean, and slightly abducted gait. Provided verbal cues for erect posture, paced breathing, and increased step height for safety. SPO2 >94% on RA, HR 134 with mild dizziness after first trial and 122-124 after subsequent trials and patient was asymptomatic.   Patient required frequent rest breaks due to increased fatigue today.   Patient in bed at end of session with breaks locked and all needs within reach.    Therapy Documentation Precautions:  Precautions Precautions: Fall Precaution Comments: 2L O2 via [place] Restrictions Weight Bearing Restrictions: No   Therapy/Group: Individual Therapy  Sky Borboa L Shyia Fillingim PT, DPT  09/12/2019, 3:43 PM

## 2019-09-12 NOTE — Consult Note (Addendum)
Referring Provider:  [facility]         Primary Care Physician:  Bartholome Bill, MD Primary Gastroenterologist:              We were asked to see this patient for:    anemia              ASSESSMENT /  PLAN    49 yo female with multiple medical problems not limited to morbid obesity, Lupus, Sjorgren's syndrome, CHF, DM, gastroparesis, CVA,  OSA. She has a hx of liver lesions presumed to be adenomas dating back to 2014.   1. Anemia. Hgb 8-9 range during January admission when she was very ill and had hemoperitoneum. While she did have an unexplained decline in hgb several days ago from 8.3 to 7.1 she isn't actively bleeding and CT scan a couple of days ago showed decrease size of liver lesions and no acute bleed. She was transfused for hgb of 7.1 but had an overzealous response with a > 2 gram rise in hgb to 9.8. I think the 8.2 today is just the blood equilibrating and is more in line with what would be expected with one unit of blood.  -will check iron studies.   2. Chronic intermittent rectal bleeding. Suspect this is hemorrhoids, had been present for months if not years. Doubt amount of bleeding enough to contribute to anemia. Brown stool without overt blood on exam today.  -She admits to constipation. Would make sure she has an aggressive bowel regimen -Will try Anusol cream Q HS x 10 days.  -When medically stable she should have a colonoscopy ( most likely outpatient).    3. Liver masses / hemoperitoneum. Masses first found in 2014. Had biopsy at [facility] at that time. Ultimately felt to be adenoma vs FNH, she underwent embolization. Admitted to [facility] in January 2021 with liver masses on imaging and hemoperitoneum.  Transferred to [place] during that admission where she underwent another embolization by IR     HPI:    Chief Complaint:  none  [name] is a 49 y.o. female with multiple medical problems as listed below.  Patient was admitted in January with nausea,  vomiting and RUQ pain.  Found to be in DKA with AKI.  CT scan showed liver masses with possible small hemoperitoneum.  We saw the patient in consultation for what appeared to be hepatic adenoma/multifocal.  It was felt that she could have bled from 1 of these adenomas causing her abdominal pain.  Her Plavix was held.  Her liver enzymes were elevated, we felt it likely to be secondary to shock liver.  We discussed the case with Dr. Enis Gash with [facility].  Since patient could possibly require IR embolization or surgical resection it was felt she would be served best by transferring to [facility].  The hospital was at capacity so therefore she was put on the wait list.  In the interim her symptoms as well as liver test improved.  She was transferred to [facility] on 08/18/2019.  MRI showed multiple nonenhancing large hepatic lesions with hemorrhage in the mass. She underwent embolization of right lobe adenomas on 08/21/2019.  For completion of care she was transferred back to [facility] 08/23/2019.  Her pain was not well controlled at that time but did eventually improve .  For functional decline she was discharged to inpatient rehab on 08/29/2019.   We were called for declining hemoglobin.  When admitted to CIR hemoglobin was  8.3 which is about what it had been running over the preceding days.  Hemoglobin being checked every couple of days and there have been some fluctuations.  Between 1/28 and 2/8 hgb slowly declined from 8.3 to 7.1.  A CT scan w/ and wo contrast was obtained.on 2/9 to evaluate the anemia The liver masses had decreased in size, nothing to suggest acute hemorrhage. She received a unit of blood on 2/9 with hgb rising all the way to 9.8. It has since dwindled to 8.2 which is the one gram response that would be expected with one unit of blood.    [place] says she has occasional rectal bleeding with bowel movements but this is not new. She believes an aunt had colon cancer. She has never had the bleeding  evaluated.   Past Medical History:  Diagnosis Date   Acute renal failure (HCC)     Intractable nausea vomiting secondary to diabetic gastroparesis causing dehydration and acute renal failure /notes 04/01/2013   Anginal pain ([place])    Anxiety    Bell's palsy 08/09/2010   Bicornuate uterus    CAP (community acquired pneumonia) 10/2011   Archie Endo 11/08/2011   Chest pain 01/13/2014   CHF (congestive heart failure) ([place])    Diabetic gastroparesis associated with type 2 diabetes mellitus ([place])    this is presumed diagnoses, not confirmed by any studies.    Eczema    Family history of malignant neoplasm of breast    Family history of malignant neoplasm of ovary    GERD (gastroesophageal reflux disease)    High cholesterol    Hypertension    Liver lesion 08/13/2019   Liver mass 2014   biopsied 03/2013 at [facility], not malignant.  is to undergo radiologic ablation of the mass in sept/October 2014.    Lupus ([place])    Migraines    "maybe a couple times/yr" (04/01/2013)   Obesity    Obstructive sleep apnea on CPAP 2011   Oxygen at nights   SJOGREN'S SYNDROME 08/09/2010   SLE (systemic lupus erythematosus) ([place])    Archie Endo 12/01/2010   Stroke ([place]) 2010; 10/2012   "left side is still weak from it, never fully regained full strength; no additions from stroke 10/2012"    Past Surgical History:  Procedure Laterality Date   BREAST CYST EXCISION Left 08/2005   epidermoid   CARDIAC CATHETERIZATION  02/07/12   ECTOPIC PREGNANCY SURGERY  1999   ECTOPIC PREGNANCY SURGERY  1999   ESOPHAGOGASTRODUODENOSCOPY N/A 02/26/2013   Procedure: ESOPHAGOGASTRODUODENOSCOPY (EGD);  Surgeon: Irene Shipper, MD;  Location: [facility] ENDOSCOPY;  Service: Endoscopy;  Laterality: N/A;   ESOPHAGOGASTRODUODENOSCOPY N/A 03/02/2015   Procedure: ESOPHAGOGASTRODUODENOSCOPY (EGD);  Surgeon: Milus Banister, MD;  Location: [place];  Service: Endoscopy;  Laterality: N/A;   HERNIA REPAIR     LEFT AND RIGHT  HEART CATHETERIZATION WITH CORONARY ANGIOGRAM N/A 02/07/2012   Procedure: LEFT AND RIGHT HEART CATHETERIZATION WITH CORONARY ANGIOGRAM;  Surgeon: Laverda Page, MD;  Location: [facility] CATH LAB;  Service: Cardiovascular;  Laterality: N/A;   LIVER BIOPSY  03/2013   liver mass/medical hx noted above   MUSCLE BIOPSY     for lupus/notes 04/01/6944   UMBILICAL HERNIA REPAIR  1980's    Prior to Admission medications   Medication Sig Start Date End Date Taking? Authorizing Provider  Accu-Chek Softclix Lancets lancets Use to monitor glucose levels 4 times per day; E11.9 10/19/18   Renato Shin, MD  albuterol [facility] HFA) 108 (90 BASE)  MCG/ACT inhaler Inhale 2 puffs into the lungs every 6 (six) hours as needed for wheezing or shortness of breath.     [provider]  albuterol (PROVENTIL) (2.5 MG/3ML) 0.083% nebulizer solution Take 2.5 mg by nebulization every 6 (six) hours as needed for wheezing.    [provider]  ALPRAZolam Duanne Moron) 0.5 MG tablet Take 0.5 mg 2 (two) times daily as needed by mouth for anxiety or sleep.  04/19/17   [provider]  amLODipine (NORVASC) 5 MG tablet Take 1 tablet (5 mg total) by mouth daily. 10/19/18   Miquel Dunn, NP  Azathioprine 75 MG TABS Take 150 mg by mouth 2 (two) times daily.     [provider]  B-D UF III MINI PEN NEEDLES 31G X 5 MM MISC USE AS DIRECTED TWICE A DAY 10/31/18   Renato Shin, MD  benzonatate (TESSALON) 100 MG capsule Take by mouth continuous as needed for cough.    [provider]  BIDIL 20-37.5 MG tablet TAKE TWO (2) TABLETS BY MOUTH THREE TIMES DAILY  Patient taking differently: Take 2 tablets by mouth 3 (three) times daily.  04/03/19   Miquel Dunn, NP  Blood Glucose Monitoring Suppl (ACCU-CHEK AVIVA) device Use as instructed 10/25/18 10/25/19  Philemon Kingdom, MD  buPROPion (WELLBUTRIN XL) 300 MG 24 hr tablet Take 300 mg by mouth daily. 06/28/19   [provider]  BYETTA 10 MCG  PEN 10 MCG/0.04ML SOPN injection INJECT 0.04ML UNDER THE SKIN 2 TIMES DAILY WITH A MEAL Patient taking differently: Inject 10 mcg into the skin 2 (two) times daily with a meal.  08/07/19   Renato Shin, MD  carvedilol (COREG) 25 MG tablet Take 37.5 mg by mouth 2 (two) times daily with a meal.     [provider]  cetirizine (ZYRTEC) 10 MG tablet Take 10 mg by mouth daily as needed for allergies.     [provider]  clotrimazole-betamethasone (LOTRISONE) cream Apply 1 application topically 2 (two) times daily. Patient taking differently: Apply 1 application topically as needed (skin irritation).  01/01/19   Renato Shin, MD  cyanocobalamin (,VITAMIN B-12,) 1000 MCG/ML injection Inject into the muscle. Every other month 12/17/18   [provider]  cycloSPORINE (RESTASIS) 0.05 % ophthalmic emulsion Place 1 drop into both eyes 2 (two) times daily.     [provider]  diclofenac sodium (VOLTAREN) 1 % GEL Apply 2-4 g 4 (four) times daily as needed topically (joint pain). 06/06/17   Geradine Girt, DO  doxepin (SINEQUAN) 50 MG capsule Take 50-100 mg by mouth at bedtime as needed (sleep).     [provider]  DULoxetine (CYMBALTA) 60 MG capsule Take 120 mg by mouth at bedtime.     [provider]  EASY TOUCH PEN NEEDLES 32G X 4 MM MISC USE TO INJECT INSULIN TWICE DAILY 08/07/19   Renato Shin, MD  Ensure Max Protein (ENSURE MAX PROTEIN) LIQD Take 330 mLs (11 oz total) by mouth 2 (two) times daily. 08/29/19   Kerney Elbe, DO  etonogestrel-ethinyl estradiol (NUVARING) 0.12-0.015 MG/24HR vaginal ring Place 1 each every 28 (twenty-eight) days vaginally. Insert vaginally and leave in place for 3 consecutive weeks, then remove for 1 week.    [provider]  famotidine (PEPCID) 20 MG tablet Take 20 mg by mouth 2 (two) times daily.    [provider]  fluticasone (FLONASE) 50 MCG/ACT nasal spray Place 2 sprays into both nostrils continuous  as  needed for allergies or rhinitis.    [provider]  glucose blood (ACCU-CHEK AVIVA PLUS) test strip Use to check blood sugar 4 times daily. **Needs appt for refills** 07/09/19   Elayne Snare, MD  hydroxychloroquine (PLAQUENIL) 200 MG tablet Take 200 mg by mouth 2 (two) times daily.     [provider]  insulin NPH Human (HUMULIN N) 100 UNIT/ML injection Inject 2 mLs (200 Units total) into the skin every morning. And syringes 3/day Patient taking differently: Inject 220 Units into the skin every morning. And syringes 3/day 07/18/19   Renato Shin, MD  Insulin Syringe-Needle U-100 (EASY TOUCH INSULIN SYRINGE) 31G X 5/16" 1 ML MISC USE AS DIRECTED THREE TIMES A DAY 10/12/18   Renato Shin, MD  levothyroxine (SYNTHROID) 50 MCG tablet Take 1 tablet (50 mcg total) by mouth daily at 6 (six) AM. 08/30/19   Raiford Noble Latif, DO  megestrol (MEGACE) 40 MG tablet Take 40 mg by mouth daily. 01/30/15   [provider]  mometasone-formoterol (DULERA) 100-5 MCG/ACT AERO Inhale 2 puffs into the lungs 2 (two) times daily. 05/29/17   [provider]  morphine (MS CONTIN) 15 MG 12 hr tablet Take 1 tablet (15 mg total) by mouth every 12 (twelve) hours. 08/29/19   Raiford Noble Latif, DO  morphine (MSIR) 15 MG tablet Take 1 tablet (15 mg total) by mouth every 6 (six) hours as needed for severe pain. 08/29/19   Raiford Noble Latif, DO  Multiple Vitamin (MULTIVITAMIN WITH MINERALS) TABS tablet Take 1 tablet by mouth daily. 08/30/19   Sheikh, Omair Latif, DO  ondansetron (ZOFRAN) 4 MG tablet Take 1 tablet (4 mg total) by mouth every 6 (six) hours as needed for nausea. 08/29/19   Raiford Noble Latif, DO  oxybutynin (DITROPAN-XL) 10 MG 24 hr tablet Take 15 mg by mouth daily.  08/30/17   [provider]  Polyvinyl Alcohol-Povidone (REFRESH OP) Place 1 drop 2 (two) times daily into both eyes.     [provider]  pravastatin (PRAVACHOL) 10 MG tablet Take 10 mg by mouth daily.     [provider]  predniSONE (DELTASONE) 1 MG tablet Take 3 mg by mouth daily.  04/21/17   [provider]  pregabalin (LYRICA) 75 MG capsule Take 75 mg 3 (three) times daily by mouth.  01/28/15   [provider]  psyllium (METAMUCIL MULTIHEALTH FIBER) 58.6 % powder Take 1 packet by mouth 2 (two) times daily.    [provider]  sacubitril-valsartan (ENTRESTO) 97-103 MG Take 1 tablet by mouth 2 (two) times daily. 08/29/19   Raiford Noble Latif, DO  sucralfate (CARAFATE) 1 GM/10ML suspension Take 1 g by mouth 2 (two) times daily. 07/08/19   [provider]  topiramate (TOPAMAX) 25 MG tablet Take 75 mg by mouth at bedtime. 03/17/16   [provider]  traMADol (ULTRAM) 50 MG tablet TAKE 1 TO 2 TABLETS BY MOUTH EVERY 6 HOURS AS NEEDED FOR PAIN**PA REQ** Patient taking differently: Take 50-100 mg by mouth every 6 (six) hours as needed (pain).  01/21/19   Pete Pelt, PA-C  Vitamin D, Ergocalciferol, (DRISDOL) 1.25 MG (50000 UNIT) CAPS capsule Take 50,000 Units by mouth every 7 (seven) days.    [provider]    Current Facility-Administered Medications  Medication Dose Route Frequency Provider Last Rate Last Admin   acetaminophen (TYLENOL) tablet 650 mg  650 mg Oral TID WC & HS Love, Pamela S, PA-C   650 mg at  09/12/19 0849   albuterol (PROVENTIL) (2.5 MG/3ML) 0.083% nebulizer solution 2.5 mg  2.5 mg Nebulization Q4H PRN Love, Pamela S, PA-C       ALPRAZolam Duanne Moron) tablet 0.5 mg  0.5 mg Oral BID PRN Love, Pamela S, PA-C       alum & mag hydroxide-simeth (MAALOX/MYLANTA) 200-200-20 MG/5ML suspension 30 mL  30 mL Oral Q4H PRN Love, Pamela S, PA-C       amLODipine (NORVASC) tablet 5 mg  5 mg Oral Daily Love, Pamela S, PA-C   5 mg at 09/12/19 0849   bisacodyl (DULCOLAX) suppository 10 mg  10 mg Rectal Daily PRN Bary Leriche, PA-C   10 mg at 09/11/19 1594   buPROPion (WELLBUTRIN XL) 24 hr tablet 300 mg  300 mg Oral Daily Bary Leriche, PA-C   300 mg at 09/12/19 0851   carvedilol (COREG) tablet 12.5 mg  12.5 mg Oral BID WC Bary Leriche, PA-C   12.5 mg at 09/12/19 5859   cetaphil lotion   Topical BID Bary Leriche, PA-C   Given at 09/12/19 2924   DermaCerin CREA   Topical BID [name], MD   Given at 09/12/19 4628   diclofenac Sodium (VOLTAREN) 1 % topical gel 2 g  2 g Topical QID Love, Pamela S, PA-C   2 g at 09/12/19 6381   diphenhydrAMINE (BENADRYL) 12.5 MG/5ML elixir 12.5-25 mg  12.5-25 mg Oral Q6H PRN Reesa Chew S, PA-C       DULoxetine (CYMBALTA) DR capsule 120 mg  120 mg Oral QHS Love, Pamela S, PA-C   120 mg at 09/11/19 2132   enoxaparin (LOVENOX) injection 40 mg  40 mg Subcutaneous Q12H Lovorn, Jinny Blossom, MD   40 mg at 09/12/19 0850   famotidine (PEPCID) tablet 20 mg  20 mg Oral BID Bary Leriche, PA-C   20 mg at 09/12/19 0849   feeding supplement (PRO-STAT SUGAR FREE 64) liquid 30 mL  30 mL Oral BID Love, Pamela S, PA-C       guaiFENesin-dextromethorphan (ROBITUSSIN DM) 100-10 MG/5ML syrup 5-10 mL  5-10 mL Oral Q6H PRN Love, Pamela S, PA-C       hydroxychloroquine (PLAQUENIL) tablet 200 mg  200 mg Oral BID Love, Pamela S, PA-C   200 mg at 09/12/19 0853   insulin aspart (novoLOG) injection 0-20 Units  0-20 Units Subcutaneous TID WC Bary Leriche, PA-C   3 Units at 09/03/19 1832   insulin aspart (novoLOG) injection 0-5 Units  0-5 Units Subcutaneous QHS Love, Pamela S, PA-C       insulin glargine (LANTUS) injection 37 Units  37 Units Subcutaneous QHS Bary Leriche, [place]   37 Units at 09/11/19 2138   isosorbide-hydrALAZINE (BIDIL) 20-37.5 MG per tablet 2 tablet  2 tablet Oral TID Love, Pamela S, PA-C   2 tablet at 09/12/19 7711   [START ON 09/13/2019] levothyroxine (SYNTHROID) tablet 150 mcg  150 mcg Oral QAC breakfast Lovorn, Megan, MD       lidocaine (LIDODERM) 5 % 1 patch  1 patch Transdermal Q24H Raulkar, Clide Deutscher, MD   1 patch at 09/11/19 1349   loratadine (CLARITIN) tablet 10 mg  10 mg Oral  Daily Bary Leriche, PA-C   10 mg at 09/12/19 0849   megestrol (MEGACE) tablet 40 mg  40 mg Oral Daily Bary Leriche, PA-C   40 mg at 09/12/19 0852   mometasone-formoterol (DULERA) 100-5 MCG/ACT inhaler 2 puff  2 puff Inhalation  BID Bary Leriche, [place]   2 puff at 09/12/19 0744   morphine (MS CONTIN) 12 hr tablet 15 mg  15 mg Oral QAC breakfast Bary Leriche, [place]   15 mg at 09/12/19 8916   morphine (MSIR) tablet 15 mg  15 mg Oral Q6H PRN Bary Leriche, PA-C   15 mg at 09/12/19 9450   multivitamin with minerals tablet 1 tablet  1 tablet Oral Daily Bary Leriche, PA-C   1 tablet at 09/12/19 3888   naphazoline-glycerin (CLEAR EYES REDNESS) ophth solution 1-2 drop  1-2 drop Both Eyes QID PRN Love, Pamela S, PA-C       oxybutynin (DITROPAN XL) 24 hr tablet 15 mg  15 mg Oral Daily Bary Leriche, PA-C   15 mg at 09/12/19 2800   polyethylene glycol (MIRALAX / GLYCOLAX) packet 17 g  17 g Oral Daily PRN Love, Pamela S, PA-C       pravastatin (PRAVACHOL) tablet 10 mg  10 mg Oral Daily Bary Leriche, PA-C   10 mg at 09/12/19 0849   predniSONE (DELTASONE) tablet 3 mg  3 mg Oral Daily Bary Leriche, PA-C   3 mg at 09/12/19 3491   pregabalin (LYRICA) capsule 75 mg  75 mg Oral TID Bary Leriche, PA-C   75 mg at 09/12/19 7915   prochlorperazine (COMPAZINE) tablet 5-10 mg  5-10 mg Oral Q6H PRN Reesa Chew S, PA-C       Or   prochlorperazine (COMPAZINE) injection 5-10 mg  5-10 mg Intramuscular Q6H PRN Love, Pamela S, PA-C       Or   prochlorperazine (COMPAZINE) suppository 12.5 mg  12.5 mg Rectal Q6H PRN Love, Pamela S, PA-C       protein supplement (ENSURE MAX) liquid  11 oz Oral BID Love, Pamela S, PA-C   11 oz at 09/05/19 2120   sacubitril-valsartan (ENTRESTO) 97-103 mg per tablet  1 tablet Oral BID Bary Leriche, PA-C   1 tablet at 09/12/19 0569   senna (SENOKOT) tablet 8.6 mg  1 tablet Oral BID Lovorn, Jinny Blossom, MD   8.6 mg at 09/12/19 0849   sodium chloride flush (NS) 0.9 %  injection 3 mL  3 mL Intravenous PRN Love, Pamela S, PA-C       sodium phosphate (FLEET) 7-19 GM/118ML enema 1 enema  1 enema Rectal Once PRN Love, Pamela S, PA-C       sucralfate (CARAFATE) 1 GM/10ML suspension 1 g  1 g Oral BID Love, Pamela S, PA-C   1 g at 09/12/19 0849   tamsulosin (FLOMAX) capsule 0.4 mg  0.4 mg Oral QPC supper Lovorn, Jinny Blossom, MD   0.4 mg at 09/11/19 1722   topiramate (TOPAMAX) tablet 75 mg  75 mg Oral QHS Bary Leriche, PA-C   75 mg at 09/11/19 2132   traMADol (ULTRAM) tablet 50-100 mg  50-100 mg Oral Q6H PRN Bary Leriche, PA-C   50 mg at 09/07/19 2115   traZODone (DESYREL) tablet 50 mg  50 mg Oral QHS Lovorn, Jinny Blossom, MD   50 mg at 09/11/19 2131    Allergies as of 08/29/2019 - Review Complete 08/29/2019  Allergen Reaction Noted   Metoclopramide Other (See Comments) 04/25/2013   Codeine Itching, Rash, and Other (See Comments) 05/09/2013   Penicillins Itching    Hydrocodone Rash 08/07/2016   Percocet [oxycodone-acetaminophen] Hives and Rash 10/06/2016    Family History  Problem Relation Age of Onset   Diabetes Mother  Asthma Mother    Urticaria Mother    Hypertension Mother    Breast cancer Maternal Aunt 41       currently 24   Stomach cancer Maternal Uncle        dx 29s; deceased   Prostate cancer Maternal Uncle        deceased 68s   Cancer Maternal Aunt        unk. primary   Cancer Maternal Aunt        unk. primary   Ovarian cancer Maternal Aunt        deceased 12s   Hypertension Other    Stroke Other    Arthritis Other    Ovarian cancer Sister 45       maternal half-sister; RAD51C positive   Cancer Paternal Aunt        unk. primary; deceased 82s   Prostate cancer Paternal Uncle        deceased 34s   Breast cancer Cousin        deceased 86s; daughter of mat uncle with stomach ca   Breast cancer Maternal Aunt        deceased 33s   Asthma Brother    Asthma Brother    Allergic rhinitis Neg Hx    Angioedema Neg  Hx     Social History   Socioeconomic History   Marital status: Single    Spouse name: Not on file   Number of children: 1   Years of education: Not on file   Highest education level: Not on file  Occupational History    Employer: UNEMPLOYED    Comment: student  Tobacco Use   Smoking status: Former Smoker    Packs/day: 0.50    Years: 3.00    Pack years: 1.50    Types: Cigarettes    Quit date: 06/02/1991    Years since quitting: 28.2   Smokeless tobacco: Never Used   Tobacco comment: quit 28 yrs ago  Substance and Sexual Activity   Alcohol use: No    Alcohol/week: 0.0 standard drinks   Drug use: No   Sexual activity: Yes    Partners: Male    Birth control/protection: None  Other Topics Concern   Not on file  Social History Narrative   Regular exercise-yes   Social Determinants of Health   Financial Resource Strain:    Difficulty of Paying Living Expenses: Not on file  Food Insecurity:    Worried About Charity fundraiser in the Last Year: Not on file   [employer] of Food in the Last Year: Not on file  Transportation Needs:    Lack of Transportation (Medical): Not on file   Lack of Transportation (Non-Medical): Not on file  Physical Activity:    Days of Exercise per Week: Not on file   Minutes of Exercise per Session: Not on file  Stress:    Feeling of Stress : Not on file  Social Connections:    Frequency of Communication with Friends and Family: Not on file   Frequency of Social Gatherings with Friends and Family: Not on file   Attends Religious Services: Not on file   Active Member of Clubs or Organizations: Not on file   Attends Archivist Meetings: Not on file   Marital Status: Not on file  Intimate Partner Violence:    Fear of Current or Ex-Partner: Not on file   Emotionally Abused: Not on file   Physically Abused: Not on file  Sexually Abused: Not on file    Review of Systems: All systems reviewed and negative  except where noted in HPI.  Physical Exam: Vital signs in last 24 hours: Temp:  [98.4 F (36.9 C)-99.2 F (37.3 C)] 98.8 F (37.1 C) (02/11 0601) Pulse Rate:  [85-87] 87 (02/11 0601) Resp:  [18-20] 20 (02/10 2019) BP: (115-136)/(58-69) 136/67 (02/11 0601) SpO2:  [97 %-100 %] 98 % (02/11 0744) Weight:  [239 kg] 239 kg (02/11 0601) Last BM Date: 09/11/19 General:   Alert, morbidly obese female in NAD Psych:  Pleasant, cooperative. Normal mood and affect. Eyes:  Pupils equal, sclera clear, no icterus.   Conjunctiva pink. Ears:  Normal auditory acuity. Nose:  No deformity, discharge,  or lesions. Neck:  Supple; no masses Lungs:  Clear throughout to auscultation.   No wheezes, crackles, or rhonchi.  Heart:  Regular rate and rhythm; no murmurs, no lower extremity edema Abdomen:  Soft, non-distended, nontender, BS active, no palp mass   Rectal:  Scant brown residual in vault. No blood present  Msk:  Symmetrical without gross deformities. . Neurologic:  Alert and  oriented x4;  grossly normal neurologically. Skin:  Intact without significant lesions or rashes.   Intake/Output from previous day: 02/10 0701 - 02/11 0700 In: 822 [P.O.:822] Out: -  Intake/Output this shift: Total I/O In: 118 [P.O.:118] Out: -   Lab Results: Recent Labs    09/11/19 0645 09/12/19 0534  WBC 8.0 7.4  HGB 9.8* 8.2*  HCT 31.2* 25.0*  PLT 420* 348   BMET Recent Labs    09/11/19 0645 09/12/19 0534  NA 142 142  K 3.8 3.6  CL 111 109  CO2 21* 23  GLUCOSE 121* 90  BUN 12 10  CREATININE 1.26* 1.24*  CALCIUM 9.5 9.0   LFT Recent Labs    09/11/19 0645  PROT 7.3  ALBUMIN 2.9*  AST 27  ALT 24  ALKPHOS 118  BILITOT 0.6   PT/INR No results for input(s): LABPROT, INR in the last 72 hours. Hepatitis Panel No results for input(s): HEPBSAG, HCVAB, HEPAIGM, HEPBIGM in the last 72 hours.   . CBC Latest Ref Rng & Units 09/12/2019 09/11/2019 09/09/2019  WBC 4.0 - 10.5 K/uL 7.4 8.0 6.7    Hemoglobin 12.0 - 15.0 g/dL 8.2(L) 9.8(L) 7.1(L)  Hematocrit 36.0 - 46.0 % 25.0(L) 31.2(L) 22.2(L)  Platelets 150 - 400 K/uL 348 420(H) 379    . CMP Latest Ref Rng & Units 09/12/2019 09/11/2019 09/09/2019  Glucose 70 - 99 mg/dL 90 121(H) 96  BUN 6 - 20 mg/dL 10 12 9   Creatinine 0.44 - 1.00 mg/dL 1.24(H) 1.26(H) 1.26(H)  Sodium 135 - 145 mmol/L 142 142 143  Potassium 3.5 - 5.1 mmol/L 3.6 3.8 3.8  Chloride 98 - 111 mmol/L 109 111 108  CO2 22 - 32 mmol/L 23 21(L) 23  Calcium 8.9 - 10.3 mg/dL 9.0 9.5 8.9  Total Protein 6.5 - 8.1 g/dL - 7.3 5.6(L)  Total Bilirubin 0.3 - 1.2 mg/dL - 0.6 0.4  Alkaline Phos 38 - 126 U/L - 118 96  AST 15 - 41 U/L - 27 18  ALT 0 - 44 U/L - 24 19   Studies/Results: CT ABDOMEN PELVIS W WO CONTRAST  Result Date: 09/11/2019 CLINICAL DATA:  Inpatient. Recent hospitalization for hemorrhagic liver adenomas reportedly recently embolized at outside facility. Worsening anemia. EXAM: CT ABDOMEN AND PELVIS WITHOUT AND WITH CONTRAST TECHNIQUE: Multidetector CT imaging of the abdomen and pelvis was performed following the  standard protocol before and following the bolus administration of intravenous contrast. CONTRAST:  167m OMNIPAQUE IOHEXOL 300 MG/ML  SOLN COMPARISON:  08/12/2019 CT abdomen/pelvis. FINDINGS: Lower chest: No significant pulmonary nodules or acute consolidative airspace disease. Hepatobiliary: There are multiple irregular low-attenuation liver masses scattered throughout the liver without compelling evidence of enhancement, decreased in size in the interval. Representative 5.8 x 5.8 cm segment 6 right liver mass (series 11/image 44), previously 7.6 x 6.2 cm. Representative 10.7 x 4.5 cm central liver mass (series 11/image 26), previously 13.4 x 5.1 cm. No appreciable new liver masses. Normal gallbladder with no radiopaque cholelithiasis. No biliary ductal dilatation. Pancreas: Normal, with no mass or duct dilation. Spleen: Normal size. No mass. Adrenals/Urinary  Tract: Normal adrenals. No renal stones. No hydronephrosis. Normal bladder. Stomach/Bowel: Normal non-distended stomach. Normal caliber small bowel with no small bowel wall thickening. Normal appendix. Normal large bowel with no diverticulosis, large bowel wall thickening or pericolonic fat stranding. Vascular/Lymphatic: Normal caliber abdominal aorta. Patent portal, splenic, hepatic and renal veins. No pathologically enlarged lymph nodes in the abdomen or pelvis. Reproductive: Grossly normal uterus. There is an ill-defined 11.4 x 2.9 cm focus of soft tissue density draping along anterior uterus in the anterior pelvis (series 11/image 107), correlating to the site of hemoperitoneum on the prior scan. Other: No pneumoperitoneum. No perihepatic hemoperitoneum. No abdominal ascites. No abdominal fluid collections. No retroperitoneal hematoma. Musculoskeletal: No aggressive appearing focal osseous lesions. IMPRESSION: 1. Low-attenuation liver masses have decreased in size since 08/12/2019 CT and demonstrate no compelling evidence of enhancement, compatible with reported interval embolization of known liver adenomas. No perihepatic hemoperitoneum to suggest acute hemorrhage of liver lesions. 2. Indeterminate ill-defined 11.4 x 2.9 cm focus of soft tissue density draping along the anterior uterus in the anterior pelvis, correlating to the site of hemoperitoneum on the prior CT study. Findings may represent residual subacute hemoperitoneum in the pelvis. Strictly speaking, an adnexal mass cannot be excluded, but is less favored. Suggest attention on follow-up CT or MRI pelvis without and with IV contrast and with oral contrast in 3 months. Electronically Signed   By: JIlona SorrelM.D.   On: 09/11/2019 08:09   [place] COMPLETE WITH TRANSVAGINAL  Result Date: 09/11/2019 CLINICAL DATA:  Pelvic mass EXAM: TRANSABDOMINAL AND TRANSVAGINAL ULTRASOUND OF PELVIS TECHNIQUE: Both transabdominal and transvaginal ultrasound  examinations of the pelvis were performed. Transabdominal technique was performed for global imaging of the pelvis including uterus, ovaries, adnexal regions, and pelvic cul-de-sac. It was necessary to proceed with endovaginal exam following the transabdominal exam to visualize the uterus, endometrium, and ovaries. COMPARISON:  09/06/2013 Correlation: CT abdomen and pelvis 09/10/2019 FINDINGS: Uterus Measurements: 7.8 x 4.5 x 5.5 cm = volume: 100 mL. Inadequately visualized on transabdominal imaging due to body habitus and incomplete bladder distension. Fundus poorly visualized on transvaginal imaging due to distance from transducer and shadowing bowel. No gross evidence of uterine mass identified Endometrium Thickness: 8 mm.  No endometrial fluid Right ovary No normal appearing RIGHT ovary visualized see below Left ovary Measurements: 2.8 x 1.2 x 2.2 cm = volume: 3.7 mL. Dominant follicle without mass Other findings Small amount of nonspecific free pelvic fluid. Cyst identified in RIGHT adnexa 3.1 x 1.9 x 1.6 cm, simple features, could represent a paraovarian cyst or a cyst replacing the RIGHT ovary. Fluid collection identified adjacent to the uterine fundus on CT is not adequately demonstrated on sonography due to limited visualization of this region as above. IMPRESSION: RIGHT adnexal cyst 3.1  cm diameter question of ovarian or paraovarian origin. No other definite pelvic sonographic abnormalities identified on limited exam as above. Electronically Signed   By: Lavonia Dana M.D.   On: 09/11/2019 14:24    Principal Problem:   Physical debility Active Problems:   SLE   SJOGREN'S SYNDROME   HTN (hypertension)   Morbid obesity (HCC)   Diabetic gastroparesis ([place])   Diabetes ([place])   Hepatic adenoma   Obesity hypoventilation syndrome ([place])   Pressure injury of skin   Acute on chronic combined systolic and diastolic CHF (congestive heart failure) ([place])    Tye Savoy, NP-C @  09/12/2019, 12:30  PM   I have reviewed the entire case in detail with the above APP and discussed the plan in detail.  Therefore, I agree with the diagnoses recorded above. In addition,  I have personally interviewed and examined the patient.  My additional thoughts are as follows:  Acute on chronic anemia due to multiple underlying chronic severe medical conditions and recent bleeding illness requiring treatment at [facility].  Repeat CT scan does not show any further bleeding from the liver lesions. Small-volume rectal bleeding felt likely to be a benign anorectal source such as internal hemorrhoids.  Hemorrhoid suppositories ordered, maintain regularity as much as possible given patient stability.  Hemoglobin has been fluctuating, but it does not seem that the port of rectal bleeding is likely to be causing significant drop in hemoglobin.  I would continue to manage this expectantly, some additional recommendations were made above.  She is in no condition for a colonoscopy at this point unless she were to have life-threatening GI bleeding.  We will see again as needed.  Total time 45 minutes, extensive chart review required on this complex patient plus in person evaluation and documentation. Nelida Meuse III Office:[phone]

## 2019-09-12 NOTE — Progress Notes (Signed)
Patient asked to come off cpap .  Oxygen placed   back on at 2 liters nasal canula.

## 2019-09-12 NOTE — Telephone Encounter (Signed)
Pam- PA spoke to Dr. Kara Mead- bleeding is not from [place].

## 2019-09-12 NOTE — Progress Notes (Signed)
Was the fall witnessed: No  Patient condition before and after the fall: Before: pt in bed, call light in reach, BSC beside the bed, non slip socks on the pt. After: pt sitting in the upright position in the floor against the bed. Pt fully clothed. Pt has permission to be Independent from bed to [facility]. Pt states she was standing to wipe, was able to pull her pants up, and went to grab the walker when it slid away from her.    Patient's reaction to the fall: No obvious signs of injury noted. Pt denies hitting her head. Pt denies pain/SOB. Denies being dizzy, no N/V. All vitals WNL.   Name of the doctor that was notified including date and time: Danella Sensing, NP  Any interventions and vital signs: Orthostatic VS obtained. All WNL. No pain with movement/ambulation.   Pt requested her sister be notified instead of her son.   Demetrios Isaacs (sister) [phone]; phone number provided by the pt- this nurse attempted to call the sister x2. No answer at this time.

## 2019-09-12 NOTE — Progress Notes (Signed)
Physical Therapy Session Note  Patient Details  Name: [name] MRN: [id number] Date of Birth: 24-Feb-1971  Today's Date: 09/12/2019 PT Individual Time: 1100-1200 PT Individual Time Calculation (min): 60 min   Short Term Goals: Week 2:  PT Short Term Goal 1 (Week 2): STG=LTG due to ELOS.  Skilled Therapeutic Interventions/Progress Updates:    Session focused on d/c planning and problem solving bed mobility in home environment, functional transfers with RW, overall endurance and activity tolerance with mobility, and gait training with RW. Pt request to use RW vs rollator due to feeling more stable. Performs supine to sit to EOB with supervision and transfers to w/c with RW at supervision level. Discussed bed height and problem solved technique for management of BLE onto bed to get into supine. Pt reports she scoots back and lifts legs into long sitting and then with HOB elevated with pillows. Showed patient the use of a leg lifter (online options for buying) or modified version using bed sheet (this is what we practiced with). On mat table, pt able to perform this mobility with overall supervision for technique and used modified leg lifter. May benefit from trial in ADL apartment bed as well as more relastic than therapy mat table and bariatric airbed in room. Pt in agreement. Pt performed gait training x 98' with overall supervision with RW for functional mobility training and overall endurance. Slower cadence noted today due to fatigue. End of session requires use of BSC for toileting (+urine) at supervision level including hygiene. Pt able to perform transfer to bed including sit to supine with supervision back into the bed.  Therapy Documentation Precautions:  Precautions Precautions: Fall Precaution Comments: 2L O2 via [place] Restrictions Weight Bearing Restrictions: No    Pain:  no complaints just fatigue   Therapy/Group: Individual Therapy  Canary Brim Ivory Broad, PT,  DPT, CBIS  09/12/2019, 12:10 PM

## 2019-09-12 NOTE — Progress Notes (Signed)
[place] PHYSICAL MEDICINE & REHABILITATION PROGRESS NOTE   Subjective/Complaints:  No vaginal bleeding of any kind.   Still cold and fatigued but no teeth chattering this AM.  Was shown to have TSH of 7.63- was started on Synthroid of 50 mcg- will increase to 150 mcg due to her weight and continued Sx's of cold/fatigue and hair loss.   .     ROS; pt denies SOB, CP, endorses abd pain; denies N/V/D/C; denies vision changes;   Objective:   CT ABDOMEN PELVIS W WO CONTRAST  Result Date: 09/11/2019 CLINICAL DATA:  Inpatient. Recent hospitalization for hemorrhagic liver adenomas reportedly recently embolized at outside facility. Worsening anemia. EXAM: CT ABDOMEN AND PELVIS WITHOUT AND WITH CONTRAST TECHNIQUE: Multidetector CT imaging of the abdomen and pelvis was performed following the standard protocol before and following the bolus administration of intravenous contrast. CONTRAST:  160mL OMNIPAQUE IOHEXOL 300 MG/ML  SOLN COMPARISON:  08/12/2019 CT abdomen/pelvis. FINDINGS: Lower chest: No significant pulmonary nodules or acute consolidative airspace disease. Hepatobiliary: There are multiple irregular low-attenuation liver masses scattered throughout the liver without compelling evidence of enhancement, decreased in size in the interval. Representative 5.8 x 5.8 cm segment 6 right liver mass (series 11/image 44), previously 7.6 x 6.2 cm. Representative 10.7 x 4.5 cm central liver mass (series 11/image 26), previously 13.4 x 5.1 cm. No appreciable new liver masses. Normal gallbladder with no radiopaque cholelithiasis. No biliary ductal dilatation. Pancreas: Normal, with no mass or duct dilation. Spleen: Normal size. No mass. Adrenals/Urinary Tract: Normal adrenals. No renal stones. No hydronephrosis. Normal bladder. Stomach/Bowel: Normal non-distended stomach. Normal caliber small bowel with no small bowel wall thickening. Normal appendix. Normal large bowel with no diverticulosis, large bowel  wall thickening or pericolonic fat stranding. Vascular/Lymphatic: Normal caliber abdominal aorta. Patent portal, splenic, hepatic and renal veins. No pathologically enlarged lymph nodes in the abdomen or pelvis. Reproductive: Grossly normal uterus. There is an ill-defined 11.4 x 2.9 cm focus of soft tissue density draping along anterior uterus in the anterior pelvis (series 11/image 107), correlating to the site of hemoperitoneum on the prior scan. Other: No pneumoperitoneum. No perihepatic hemoperitoneum. No abdominal ascites. No abdominal fluid collections. No retroperitoneal hematoma. Musculoskeletal: No aggressive appearing focal osseous lesions. IMPRESSION: 1. Low-attenuation liver masses have decreased in size since 08/12/2019 CT and demonstrate no compelling evidence of enhancement, compatible with reported interval embolization of known liver adenomas. No perihepatic hemoperitoneum to suggest acute hemorrhage of liver lesions. 2. Indeterminate ill-defined 11.4 x 2.9 cm focus of soft tissue density draping along the anterior uterus in the anterior pelvis, correlating to the site of hemoperitoneum on the prior CT study. Findings may represent residual subacute hemoperitoneum in the pelvis. Strictly speaking, an adnexal mass cannot be excluded, but is less favored. Suggest attention on follow-up CT or MRI pelvis without and with IV contrast and with oral contrast in 3 months. Electronically Signed   By: Ilona Sorrel M.D.   On: 09/11/2019 08:09   US PELVIC COMPLETE WITH TRANSVAGINAL  Result Date: 09/11/2019 CLINICAL DATA:  Pelvic mass EXAM: TRANSABDOMINAL AND TRANSVAGINAL ULTRASOUND OF PELVIS TECHNIQUE: Both transabdominal and transvaginal ultrasound examinations of the pelvis were performed. Transabdominal technique was performed for global imaging of the pelvis including uterus, ovaries, adnexal regions, and pelvic cul-de-sac. It was necessary to proceed with endovaginal exam following the transabdominal  exam to visualize the uterus, endometrium, and ovaries. COMPARISON:  09/06/2013 Correlation: CT abdomen and pelvis 09/10/2019 FINDINGS: Uterus Measurements: 7.8 x 4.5 x 5.5 cm =  volume: 100 mL. Inadequately visualized on transabdominal imaging due to body habitus and incomplete bladder distension. Fundus poorly visualized on transvaginal imaging due to distance from transducer and shadowing bowel. No gross evidence of uterine mass identified Endometrium Thickness: 8 mm.  No endometrial fluid Right ovary No normal appearing RIGHT ovary visualized see below Left ovary Measurements: 2.8 x 1.2 x 2.2 cm = volume: 3.7 mL. Dominant follicle without mass Other findings Small amount of nonspecific free pelvic fluid. Cyst identified in RIGHT adnexa 3.1 x 1.9 x 1.6 cm, simple features, could represent a paraovarian cyst or a cyst replacing the RIGHT ovary. Fluid collection identified adjacent to the uterine fundus on CT is not adequately demonstrated on sonography due to limited visualization of this region as above. IMPRESSION: RIGHT adnexal cyst 3.1 cm diameter question of ovarian or paraovarian origin. No other definite pelvic sonographic abnormalities identified on limited exam as above. Electronically Signed   By: Lavonia Dana M.D.   On: 09/11/2019 14:24   Recent Labs    09/11/19 0645 09/12/19 0534  WBC 8.0 7.4  HGB 9.8* 8.2*  HCT 31.2* 25.0*  PLT 420* 348   Recent Labs    09/11/19 0645 09/12/19 0534  NA 142 142  K 3.8 3.6  CL 111 109  CO2 21* 23  GLUCOSE 121* 90  BUN 12 10  CREATININE 1.26* 1.24*  CALCIUM 9.5 9.0    Intake/Output Summary (Last 24 hours) at 09/12/2019 1007 Last data filed at 09/12/2019 0827 Gross per 24 hour  Intake 700 ml  Output -  Net 700 ml     Physical Exam: Vital Signs Blood pressure 136/67, pulse 87, temperature 98.8 F (37.1 C), temperature source Oral, resp. rate 20, height 5\' 8"  (1.727 m), weight (!) 239 kg, last menstrual period 03/24/2017, SpO2 98  %.  Morbidly obese female with BMI of 65;, sitting up in bed; then sa win hallway walking slightly SOB at end of her walk; hair on chin, NAD  HENT:  Head: Normocephalic and atraumatic.  Nose: Nose normal. Lawson in place initially, not when walking- sats >94% Hair on chin, but losing lateral eyebrows and can see hair loss frontally.  Mouth/Throat: Oropharynx is clear and moist.  Eyes:  Conjunctivae and EOM are normal.  Neck: No tracheal deviation present.  Cardiovascular: Normal rate and regular rhythm.  Respiratory:CTA B/L, however slightly decreased at bases still ;  GI: Soft. TTP over RUQ- no rebound; protuberant; ND, hyperactive BS  Musculoskeletal:        Cervical back: Normal range of motion and neck supple.     Comments: B/L UEs- deltoi 4-/5, biceps 4/5, triceps 4/5, WE 4/5, grip 4/5, finger abd 4-/5 LEs- HF 2+/5, KE 2+/5, DF/PF 4-/5.   Neurological:Sensation intact in all 4 extremities to light touch Skin: Skin is warm and extremely dry, esp on arms A little healing seen under L and between breasts under skin fold- smaller openings- under R breast, no significant change- still open/skin split with yeast notable.  1 IV in L forearm- no signs of infiltration  Psychiatric:  Brighter affect; happier that working up anemia.     Assessment/Plan: 1. Functional deficits secondary to debility due to liver bleeding adenoma and DKA/AKI which require 3+ hours per day of interdisciplinary therapy in a comprehensive inpatient rehab setting.  Physiatrist is providing close team supervision and 24 hour management of active medical problems listed below.  Physiatrist and rehab team continue to assess barriers to discharge/monitor patient progress toward  functional and medical goals  Care Tool:  Bathing    Body parts bathed by patient: Right arm, Left arm, Chest, Abdomen, Front perineal area, Face, Right upper leg, Left upper leg, Right lower leg, Left lower leg, Buttocks   Body parts bathed  by helper: Buttocks, Right lower leg, Left lower leg     Bathing assist Assist Level: Supervision/Verbal cueing     Upper Body Dressing/Undressing Upper body dressing   What is the patient wearing?: Pull over shirt    Upper body assist Assist Level: Set up assist    Lower Body Dressing/Undressing Lower body dressing      What is the patient wearing?: Underwear/pull up, Pants     Lower body assist Assist for lower body dressing: Minimal Assistance - Patient > 75%     Toileting Toileting    Toileting assist Assist for toileting: Contact Guard/Touching assist     Transfers Chair/bed transfer  Transfers assist     Chair/bed transfer assist level: Contact Guard/Touching assist Chair/bed transfer assistive device: Programmer, multimedia   Ambulation assist   Ambulation activity did not occur: Safety/medical concerns(bilateral LE weakness, fatigue, SOB)  Assist level: Supervision/Verbal cueing Assistive device: Walker-rolling Max distance: 20'   Walk 10 feet activity   Assist  Walk 10 feet activity did not occur: Safety/medical concerns(bilateral LE weakness, fatigue, SOB)  Assist level: Supervision/Verbal cueing Assistive device: Walker-rolling   Walk 50 feet activity   Assist Walk 50 feet with 2 turns activity did not occur: Safety/medical concerns(bilateral LE weakness, fatigue, SOB)  Assist level: Supervision/Verbal cueing Assistive device: Walker-rolling    Walk 150 feet activity   Assist Walk 150 feet activity did not occur: Safety/medical concerns(bilateral LE weakness, fatigue, SOB)         Walk 10 feet on uneven surface  activity   Assist Walk 10 feet on uneven surfaces activity did not occur: Safety/medical concerns(bilateral LE weakness, fatigue, SOB)         Wheelchair     Assist Will patient use wheelchair at discharge?: Yes Type of Wheelchair: Manual Wheelchair activity did not occur: Safety/medical  concerns(fatigue, SOB, decreased endurance)         Wheelchair 50 feet with 2 turns activity    Assist    Wheelchair 50 feet with 2 turns activity did not occur: Safety/medical concerns(fatigue, SOB, decreased endurance)       Wheelchair 150 feet activity     Assist  Wheelchair 150 feet activity did not occur: Safety/medical concerns(fatigue, SOB, decreased endurance)       Blood pressure 136/67, pulse 87, temperature 98.8 F (37.1 C), temperature source Oral, resp. rate 20, height 5\' 8"  (1.727 m), weight (!) 239 kg, last menstrual period 03/24/2017, SpO2 98 %.  1.  Impaired mobility, ADLs and function secondary to debility due to hemrrhagic shock due to bleeding adenoma, AKI, and DKA             -patient may  shower             -ELOS/Goals: 7-10 days/ supervision to min assist  -Continue CIR PT and OT 2.  Antithrombotics: -DVT/anticoagulation:  Mechanical: Sequential compression devices, below knee Bilateral lower extremities  -would benefit from Lovenox- will see if possible since her size and immobility             -antiplatelet therapy: N/A 3. Pain Management: Need to educated/encourage patient to work thorough pain as > 7 days post embolization. Continue MS contin with MSIR  2/6- stopped MS contin and continued MSIR and tylenol- pt still says pain better controlled   2/8: R sided chest pain worse with leaning forward. Tenderness to palpation suggestive of costochondritis. Will add lidocaine patch to right chest wall.  4. Mood: LCSW to follow for evaluation and support.              -antipsychotic agents: N/A 5. Neuropsych: This patient is capable of making decisions on her own behalf. 6. Skin/Wound Care:  Antimicrobial fabric to absorb moisture to assist in management of MASD.  7. Fluids/Electrolytes/Nutrition: Continue Ensure max. Will add prostat for depleted protein stores.  8. Liver adenomas with bleeding s/p embolization: Continue to monitor H/H.  2/10-  transfused 1 units; but has a anterior uterus 11/4 x3cm mass- ? Bleeding-  9. OSA/OHS: Continue 2L oxygen per [place]--encourage CPAP at nights. Continue Dulera bid 10. T2DM: Hgb A1c- 8.6--> improving/sees Dr. Loanne Drilling. Continue to monitor BS ac/hs and titrate lantus as indicated--intake poor at this time. Continue Lantus with SSI  CBG (last 3)  Recent Labs    09/11/19 1752 09/11/19 2103 09/12/19 0602  GLUCAP 115* 96 73    2/2- well controlled BGs  2/4- CR:[id number]- will con't regimen  2/6-2/7- BGs 77-104- great control stil;  con't regimen  2/10- 91-117- con't regimen  2/11- great BG control- con't plan  11. Acute on chronic renal failure?: SCR improved from 3.10-->1.29  1/29- Cr 1.09 and BUN 20  2/1- Cr bounced back to 1.4- BUN 21- no Lasix/fluid pill on board- will encourage slight increase in fluid intake and recheck Wednesday.   2/3- will recheck in AM- esp since starting Diflucan  2/4- will give some IVFs for Cr of 1.5 and BUN of 23- will give NS 75cc/hour x 1L due to elevated kidney function and recheck in AM.   2/5- IVFs helpful- BUN 17; Cr 1.32  2/7- labs tomorrow AM.   2/8: Labs personally reviewed. Cr is stable.  12. Chronic combined systolic CHF: Check weights daily. Monitor for signs of overload. On Entresto, pravastatin, Bidil and coreg.    Filed Weights   09/07/19 0521 09/09/19 0457 09/12/19 0601  Weight: 133 kg (!) 165 kg (!) 239 kg    1/29- will monitor weights for trend, daily  09/25/2022: Weight significantly down.   1/31: no weight  2/1- no weight  2/2- Weight down by 1 Kilo overall  2/4- no weight today  2/6- Weight wrong- was 133 kg when she was 194 kg 2 days prior  2/7- no weight  2/8: weight unreliable.   2/11- weight 239- was 190s kg- is unrelaiable and will check with Nursing about this.  13. H/o Lupus: On Plaquenil bid with prednisone. On Lyrica for pain management.   14. H/o anxiety/depression: On Wellbutrin and cymbalta with Xanax prn. - getting over recent  death of brother.   September 25, 2022: Likely exacerbated by SOB. On [place] 2L, satting above 96%. 15. Hematuria?/Vaginal bleeding?: Nuvaring out PTA. Reports heavy bleeding off megestrol--?resume.   2/11- bleeding vaginally stopped 16. Constipation: 2022-09-25: patient states last BM was Monday and she feels constipated. Will order mag citrate. Patient worried about making it to bathroom on time. Advised that bedside commode will be placed next to her prior to administration.   2/1- 3 BMs in last 36 hours- last 1 at 2pm- hopefully feeling better.  2/9- all Senokot 1 tab BID and give Miralax- LBM 2/5.   17. Denuded skin under breasts- yeast?; placed WOC consult and will  follow guidelines.    2/3- will add Diflucan 100 mg daily x 6 days for yeast adding to problems with yeast underneath breasts.   2/4- wounds are healing somewhat/so far.  18. Urinary frequency: 2/8: ordered UA/UC. UA personally removed and within normal limits. UC in process.   2/9- will try Flomax 0.4 mg QHS to see if will help with possible urinary retention.  19. ABLAnemia  2/9- Hb down to 7.1- just continues to trend down- will d/w pt about transfusion.   2/10- 2.9 x11.4 cm mass- bleeding? Not sure where bleeding is coming from; has a hemoccult (+) as well, but CT of abd and pelvis is not c/w live  Adenomas bleeding still. Will call Gyn AND GI to see if recs.   2/11- GYN doesn't feel acute blood loss anemia due to GYN reasons- will check with GI- concerned that is multifactorial, but still, trending down too fast.  20. Hypothyroidism-  2/11- TSH 7.63- will increase Synthroid to 150 mcg   LOS: 14 days A FACE TO FACE EVALUATION WAS PERFORMED  Shereena Berquist 09/12/2019, 10:07 AM

## 2019-09-12 NOTE — Progress Notes (Signed)
Occupational Therapy Session Note  Patient Details  Name: [name] MRN: [id number] Date of Birth: Feb 13, 1971  Today's Date: 09/12/2019 OT Individual Time: 1300-1415 OT Individual Time Calculation (min): 75 min    Short Term Goals: Week 2:  OT Short Term Goal 1 (Week 2): patient will complete LB bathing and dressing with AD supervision/set up OT Short Term Goal 2 (Week 2): patient will increase standing tolerance to 10 minutes OT Short Term Goal 3 (Week 2): patient will complete funcitonal transfers with CS  Skilled Therapeutic Interventions/Progress Updates:    Patient in bed, alert and ready for therapy.  Reviewed plan of care and current status.  She states that she is tired but otherwise feels okay.  Supine to sitting edge of bed with CS.  Reviewed SPT to bedside commode and independent toileting - she demonstrates good safety daytime and is aware of when to call for help.  She is cleared for independent transfer to commode during the day - nursing notified and posted care tool updated.  She is able to ambulate without AD 10 feet in room with CGA. Completed ambulation on the unit x3 with rollator CS - she is able to pivot to rollator seat for rest breaks with CS.  Completed standing balance, conditioning activities with rest breaks between.  Ambulation with RW back to bed CS.  She remained in bed at close of session, call bell and tray table in reach.   Therapy Documentation Precautions:  Precautions Precautions: Fall Precaution Comments: 2L O2 via [place] Restrictions Weight Bearing Restrictions: No General:   Vital Signs:   Pain: Pain Assessment Pain Scale: 0-10 Pain Score: 0-No pain   Other Treatments:     Therapy/Group: Individual Therapy  Carlos Levering 09/12/2019, 2:23 PM

## 2019-09-13 ENCOUNTER — Inpatient Hospital Stay (HOSPITAL_COMMUNITY): Payer: Medicaid Other

## 2019-09-13 ENCOUNTER — Inpatient Hospital Stay (HOSPITAL_COMMUNITY): Payer: Medicaid Other | Admitting: Occupational Therapy

## 2019-09-13 LAB — COMPREHENSIVE METABOLIC PANEL
ALT: 18 U/L (ref 0–44)
AST: 24 U/L (ref 15–41)
Albumin: 2.5 g/dL — ABNORMAL LOW (ref 3.5–5.0)
Alkaline Phosphatase: 88 U/L (ref 38–126)
Anion gap: 7 (ref 5–15)
BUN: 13 mg/dL (ref 6–20)
CO2: 21 mmol/L — ABNORMAL LOW (ref 22–32)
Calcium: 8.8 mg/dL — ABNORMAL LOW (ref 8.9–10.3)
Chloride: 112 mmol/L — ABNORMAL HIGH (ref 98–111)
Creatinine, Ser: 1.05 mg/dL — ABNORMAL HIGH (ref 0.44–1.00)
GFR calc Af Amer: >60 mL/min (ref >60)
GFR calc non Af Amer: >60 mL/min (ref >60)
Glucose, Bld: 78 mg/dL (ref 70–99)
Potassium: 3.8 mmol/L (ref 3.5–5.1)
Sodium: 140 mmol/L (ref 135–145)
Total Bilirubin: 0.7 mg/dL (ref 0.3–1.2)
Total Protein: 5.9 g/dL — ABNORMAL LOW (ref 6.5–8.1)

## 2019-09-13 LAB — GLUCOSE, CAPILLARY
Glucose-Capillary: 86 mg/dL (ref 70–99)
Glucose-Capillary: 92 mg/dL (ref 70–99)
Glucose-Capillary: 105 mg/dL — ABNORMAL HIGH (ref 70–99)
Glucose-Capillary: 92 mg/dL (ref 70–99)

## 2019-09-13 LAB — RETICULOCYTES
Immature Retic Fract: 18.4 % — ABNORMAL HIGH (ref 2.3–15.9)
RBC.: 3.07 MIL/uL — ABNORMAL LOW (ref 3.87–5.11)
Retic Count, Absolute: 56.8 10*3/uL (ref 19.0–186.0)
Retic Ct Pct: 1.9 % (ref 0.4–3.1)

## 2019-09-13 LAB — FERRITIN: Ferritin: 198 ng/mL (ref 11–307)

## 2019-09-13 LAB — IRON AND TIBC
Iron: 29 ug/dL (ref 28–170)
Saturation Ratios: 13 % (ref 10.4–31.8)
TIBC: 227 ug/dL — ABNORMAL LOW (ref 250–450)
UIBC: 198 ug/dL

## 2019-09-13 LAB — FOLATE: Folate: 10.6 ng/mL (ref >5.9)

## 2019-09-13 LAB — VITAMIN B12: Vitamin B-12: 373 pg/mL (ref 180–914)

## 2019-09-13 MED ORDER — SODIUM CHLORIDE 0.9 % IV SOLN
510.0000 mg | INTRAVENOUS | Status: DC
  Administered 2019-09-13: 510 mg via INTRAVENOUS
  Filled 2019-09-13: qty 17

## 2019-09-13 NOTE — Progress Notes (Signed)
Physical Therapy Session Note Session performed as makeup for missed time  Patient Details  Name: [name] MRN: [id number] Date of Birth: Mar 26, 1971  Today's Date: 09/13/2019 PT Individual Time: 0935-1000 PT Individual Time Calculation (min): 25 min   Short Term Goals: Week 1:  PT Short Term Goal 1 (Week 1): Pt will perform bed mobility mod A PT Short Term Goal 1 - Progress (Week 1): Met PT Short Term Goal 2 (Week 1): Pt will transfer stand<>pivot with LRAD max A PT Short Term Goal 2 - Progress (Week 1): Met PT Short Term Goal 3 (Week 1): Pt will perform car transfer with LRAD max A PT Short Term Goal 3 - Progress (Week 1): Met Week 2:  PT Short Term Goal 1 (Week 2): STG=LTG due to ELOS. PT Short Term Goal 1 - Progress (Week 2): Progressing toward goal Week 3:  PT Short Term Goal 1 (Week 3): STG=LTG due to ELOS.  Skilled Therapeutic Interventions/Progress Updates:     PAIN  Denies pain  States she has been very fatigued, gets SOB easliy.    Pt initially supine and agreeable to treatment session with focus on endurance/gait.  Pt supine to sit w/supervision.  STS w/supervision to RW.  Gait trials/off 02 today/ W/RW and close supervison as follows:   42f      HR 119, 02sats 97% 447f      HR 115, 02sats 95% 4048f    HR 118, 02 sats 95%  Pt rested seated on rollator between trials.  Last effort pt transferred to edge of bed w/RW w/close supervision and cues.  Sit to supine w/max assist for LE management. Pt left supine w/rails up x 3, alarm set, bed in lowest position, and needs in reach.  Therapy Documentation Precautions:  Precautions Precautions: Fall Precaution Comments: 2L O2 via [place] Restrictions Weight Bearing Restrictions: No    Therapy/Group: Individual Therapy  BarCallie FieldingT   BarJerrilyn Cairo12/2021, 12:53 PM

## 2019-09-13 NOTE — Progress Notes (Signed)
Occupational Therapy Weekly Progress Note  Patient Details  Name: [name] MRN: [id number] Date of Birth: May 14, 1971  Beginning of progress report period: September 06, 2019 End of progress report period: September 13, 2019  Today's Date: 09/13/2019 OT Individual Time: 1100-1205 OT Individual Time Calculation (min): 65 min    Patient has met 3 of 4 short term goals.  Patient continues to make gains in all functional areas although she has needed a transfusion due to low hemoglobin.  Fatigue continues to be an issue.    Patient continues to demonstrate the following deficits: muscle weakness, decreased cardiorespiratoy endurance and decreased standing balance and therefore will continue to benefit from skilled OT intervention to enhance overall performance with BADL and iADL.  Patient progressing toward long term goals..  Continue plan of care. with extended LOS due to medical work up and need for transfusion.  S/p fall last night - she was cleared for transfer to commode daytime but encouraged to call for assistance at night time due to sleep meds  - will reinforce with patient and nursing.    OT Short Term Goals Week 2:  OT Short Term Goal 1 (Week 2): patient will complete LB bathing and dressing with AD supervision/set up OT Short Term Goal 1 - Progress (Week 2): Met OT Short Term Goal 2 (Week 2): patient will increase standing tolerance to 10 minutes OT Short Term Goal 2 - Progress (Week 2): Progressing toward goal OT Short Term Goal 3 (Week 2): patient will complete funcitonal transfers with CS OT Short Term Goal 3 - Progress (Week 2): Met Week 3:  OT Short Term Goal 1 (Week 3): STG = LTG  Skilled Therapeutic Interventions/Progress Updates:    Patient in bed, alert - denies pain but states that she is cold today.  Reviewed transfer to commode and what happened with fall last night - she states that the commode and walker were moved out of reach and she fell due to commode sliding  while she was trying to reach for the walker.  Reviewed ideal placement and objects needed within reach in order to complete transfer safely - she is able to verbalize and demonstrate her ability to complete this task safely.  toileting completed this session x2 mod I with appropriate set up.   Patient gathered her laundry utilizing rollator with CS.  She accessed laundry area and loaded her laundry into the washing machine using rollator - she sat for a rest break and returned to standing position with CS to complete start of washer cycle.  Ambulation with rollator x2 CS.  She made a card for her great niece for valentines day and returned to bed at close of session.  She is able to get both legs into bed this session.  Tray table and callbell in reach - bedside commode set up for use as needed.    Therapy Documentation Precautions:  Precautions Precautions: Fall Precaution Comments: 2L O2 via [place] [employer] Restrictions Weight Bearing Restrictions: No   Other Treatments:     Therapy/Group: Individual Therapy  Carlos Levering 09/13/2019, 7:40 AM

## 2019-09-13 NOTE — Progress Notes (Signed)
[place] PHYSICAL MEDICINE & REHABILITATION PROGRESS NOTE   Subjective/Complaints:  Pt reports she fell last night when rollator got out from in front of her- R shoulder hurts a little and aching all over slightly, but overall, feels ok.   Spoke about seeing GI-= they suggested her bleeding was due to internal hemorrhoids and added Anusol HC suppository BID x 10 days- she supposed to leave next week, so will see if can get it for her at home, since insurance rarely covers it.   Still tired and cold.   ROS; pt denies SOB, CP, endorses abd pain; denies N/V/D/C; denies vision changes;   Objective:   US PELVIC COMPLETE WITH TRANSVAGINAL  Result Date: 09/11/2019 CLINICAL DATA:  Pelvic mass EXAM: TRANSABDOMINAL AND TRANSVAGINAL ULTRASOUND OF PELVIS TECHNIQUE: Both transabdominal and transvaginal ultrasound examinations of the pelvis were performed. Transabdominal technique was performed for global imaging of the pelvis including uterus, ovaries, adnexal regions, and pelvic cul-de-sac. It was necessary to proceed with endovaginal exam following the transabdominal exam to visualize the uterus, endometrium, and ovaries. COMPARISON:  09/06/2013 Correlation: CT abdomen and pelvis 09/10/2019 FINDINGS: Uterus Measurements: 7.8 x 4.5 x 5.5 cm = volume: 100 mL. Inadequately visualized on transabdominal imaging due to body habitus and incomplete bladder distension. Fundus poorly visualized on transvaginal imaging due to distance from transducer and shadowing bowel. No gross evidence of uterine mass identified Endometrium Thickness: 8 mm.  No endometrial fluid Right ovary No normal appearing RIGHT ovary visualized see below Left ovary Measurements: 2.8 x 1.2 x 2.2 cm = volume: 3.7 mL. Dominant follicle without mass Other findings Small amount of nonspecific free pelvic fluid. Cyst identified in RIGHT adnexa 3.1 x 1.9 x 1.6 cm, simple features, could represent a paraovarian cyst or a cyst replacing the RIGHT  ovary. Fluid collection identified adjacent to the uterine fundus on CT is not adequately demonstrated on sonography due to limited visualization of this region as above. IMPRESSION: RIGHT adnexal cyst 3.1 cm diameter question of ovarian or paraovarian origin. No other definite pelvic sonographic abnormalities identified on limited exam as above. Electronically Signed   By: Lavonia Dana M.D.   On: 09/11/2019 14:24   Recent Labs    09/11/19 0645 09/12/19 0534  WBC 8.0 7.4  HGB 9.8* 8.2*  HCT 31.2* 25.0*  PLT 420* 348   Recent Labs    09/12/19 0534 09/13/19 0517  NA 142 140  K 3.6 3.8  CL 109 112*  CO2 23 21*  GLUCOSE 90 78  BUN 10 13  CREATININE 1.24* 1.05*  CALCIUM 9.0 8.8*    Intake/Output Summary (Last 24 hours) at 09/13/2019 0948 Last data filed at 09/12/2019 1912 Gross per 24 hour  Intake 490 ml  Output --  Net 490 ml     Physical Exam: Vital Signs Blood pressure 140/76, pulse 83, temperature 98 F (36.7 C), resp. rate 20, height 5\' 8"  (1.727 m), weight (!) 239 kg, last menstrual period 03/24/2017, SpO2 99 %.  Morbidly obese female with BMI of 65; laying in bariatric bed; hair on top of head; smiling more;  NAD  HENT:  Head: Normocephalic and atraumatic.  Nose: Nose normal. Wearing O2 by Gideon this AM Eyes:  Conjunctivae and EOM are normal.  Neck: No tracheal deviation present.  Cardiovascular: Normal rate and regular rhythm.  Respiratory:CTA B/L, however slightly decreased at bases still ;  GI: Soft. TTP over RUQ- no rebound; protuberant; ND, hyperactive BS  Musculoskeletal:  Cervical back: Normal range of motion and neck supple.     Comments: B/L UEs- deltoi 4-/5, biceps 4/5, triceps 4/5, WE 4/5, grip 4/5, finger abd 4-/5 LEs- HF 2+/5, KE 2+/5, DF/PF 4-/5.   Neurological:Sensation intact in all 4 extremities to light touch Skin: Skin is warm and extremely dry, esp on arms A little healing seen under L and between breasts under skin fold- smaller openings-  under R breast, no significant change- still open/skin split with yeast notable.  1 IV in L forearm- no signs of infiltration  Psychiatric:  Brighter affect; smiling some     Assessment/Plan: 1. Functional deficits secondary to debility due to liver bleeding adenoma and DKA/AKI which require 3+ hours per day of interdisciplinary therapy in a comprehensive inpatient rehab setting.  Physiatrist is providing close team supervision and 24 hour management of active medical problems listed below.  Physiatrist and rehab team continue to assess barriers to discharge/monitor patient progress toward functional and medical goals  Care Tool:  Bathing    Body parts bathed by patient: Right arm, Left arm, Chest, Abdomen, Front perineal area, Face, Right upper leg, Left upper leg, Right lower leg, Left lower leg, Buttocks   Body parts bathed by helper: Buttocks, Right lower leg, Left lower leg     Bathing assist Assist Level: Set up assist     Upper Body Dressing/Undressing Upper body dressing   What is the patient wearing?: Pull over shirt    Upper body assist Assist Level: Set up assist    Lower Body Dressing/Undressing Lower body dressing      What is the patient wearing?: Underwear/pull up, Pants     Lower body assist Assist for lower body dressing: Set up assist     Toileting Toileting    Toileting assist Assist for toileting: Supervision/Verbal cueing     Transfers Chair/bed transfer  Transfers assist     Chair/bed transfer assist level: Supervision/Verbal cueing Chair/bed transfer assistive device: Walker, Clinical biochemist   Ambulation assist   Ambulation activity did not occur: Safety/medical concerns(bilateral LE weakness, fatigue, SOB)  Assist level: Supervision/Verbal cueing Assistive device: Walker-rolling Max distance: 75'   Walk 10 feet activity   Assist  Walk 10 feet activity did not occur: Safety/medical concerns(bilateral LE  weakness, fatigue, SOB)  Assist level: Supervision/Verbal cueing Assistive device: Walker-rolling   Walk 50 feet activity   Assist Walk 50 feet with 2 turns activity did not occur: Safety/medical concerns(bilateral LE weakness, fatigue, SOB)  Assist level: Supervision/Verbal cueing Assistive device: Walker-rolling    Walk 150 feet activity   Assist Walk 150 feet activity did not occur: Safety/medical concerns(bilateral LE weakness, fatigue, SOB)         Walk 10 feet on uneven surface  activity   Assist Walk 10 feet on uneven surfaces activity did not occur: Safety/medical concerns(bilateral LE weakness, fatigue, SOB)         Wheelchair     Assist Will patient use wheelchair at discharge?: Yes Type of Wheelchair: Manual Wheelchair activity did not occur: Safety/medical concerns(fatigue, SOB, decreased endurance)         Wheelchair 50 feet with 2 turns activity    Assist    Wheelchair 50 feet with 2 turns activity did not occur: Safety/medical concerns(fatigue, SOB, decreased endurance)       Wheelchair 150 feet activity     Assist  Wheelchair 150 feet activity did not occur: Safety/medical concerns(fatigue, SOB, decreased endurance)  Blood pressure 140/76, pulse 83, temperature 98 F (36.7 C), resp. rate 20, height 5\' 8"  (1.727 m), weight (!) 239 kg, last menstrual period 03/24/2017, SpO2 99 %.  1.  Impaired mobility, ADLs and function secondary to debility due to hemrrhagic shock due to bleeding adenoma, AKI, and DKA             -patient may  shower             -ELOS/Goals: 7-10 days/ supervision to min assist  -Continue CIR PT and OT 2.  Antithrombotics: -DVT/anticoagulation:  Mechanical: Sequential compression devices, below knee Bilateral lower extremities  -would benefit from Lovenox- will see if possible since her size and immobility             -antiplatelet therapy: N/A 3. Pain Management: Need to educated/encourage patient to  work thorough pain as > 7 days post embolization. Continue MS contin with MSIR  2/6- stopped MS contin and continued MSIR and tylenol- pt still says pain better controlled   2/8: R sided chest pain worse with leaning forward. Tenderness to palpation suggestive of costochondritis. Will add lidocaine patch to right chest wall.  4. Mood: LCSW to follow for evaluation and support.              -antipsychotic agents: N/A 5. Neuropsych: This patient is capable of making decisions on her own behalf. 6. Skin/Wound Care:  Antimicrobial fabric to absorb moisture to assist in management of MASD.  7. Fluids/Electrolytes/Nutrition: Continue Ensure max. Will add prostat for depleted protein stores.  8. Liver adenomas with bleeding s/p embolization: Continue to monitor H/H.  2/10- transfused 1 units; but has a anterior uterus 11/4 x3cm mass- ? Bleeding-   2/12- not bleeding per CT or GI 9. OSA/OHS: Continue 2L oxygen per [place]--encourage CPAP at nights. Continue Dulera bid 10. T2DM: Hgb A1c- 8.6--> improving/sees Dr. Loanne Drilling. Continue to monitor BS ac/hs and titrate lantus as indicated--intake poor at this time. Continue Lantus with SSI  CBG (last 3)  Recent Labs    09/12/19 1635 09/12/19 2056 09/13/19 0621  GLUCAP 92 80 86    2/2- well controlled BGs  2/4- CR:[id number]- will con't regimen  2/6-2/7- BGs 77-104- great control stil;  con't regimen  2/10- 91-117- con't regimen  2/11- great BG control- con't plan  11. Acute on chronic renal failure?: SCR improved from 3.10-->1.29  1/29- Cr 1.09 and BUN 20  2/1- Cr bounced back to 1.4- BUN 21- no Lasix/fluid pill on board- will encourage slight increase in fluid intake and recheck Wednesday.   2/3- will recheck in AM- esp since starting Diflucan  2/4- will give some IVFs for Cr of 1.5 and BUN of 23- will give NS 75cc/hour x 1L due to elevated kidney function and recheck in AM.   2/5- IVFs helpful- BUN 17; Cr 1.32  2/7- labs tomorrow AM.   2/8: Labs personally  reviewed. Cr is stable.  2/12- labs reviewed- Cr down to 1.05- will con't to monitor  12. Chronic combined systolic CHF: Check weights daily. Monitor for signs of overload. On Entresto, pravastatin, Bidil and coreg.    Filed Weights   09/07/19 0521 09/09/19 0457 09/12/19 0601  Weight: 133 kg (!) 165 kg (!) 239 kg    1/29- will monitor weights for trend, daily  1/30: Weight significantly down.   1/31: no weight  2/1- no weight  2/2- Weight down by 1 Kilo overall  2/4- no weight today  2/6- Weight wrong- was  133 kg when she was 194 kg 2 days prior  2/7- no weight  2/8: weight unreliable.   2/11- weight 239- was 190s kg- is unrelaiable and will check with Nursing about this.  13. H/o Lupus: On Plaquenil bid with prednisone. On Lyrica for pain management.   14. H/o anxiety/depression: On Wellbutrin and cymbalta with Xanax prn. - getting over recent death of brother.   03-Sep-2022: Likely exacerbated by SOB. On [place] 2L, satting above 96%. 15. Hematuria?/Vaginal bleeding?: Nuvaring out PTA. Reports heavy bleeding off megestrol--?resume.   2/11- bleeding vaginally stopped 16. Constipation: 2022/09/03: patient states last BM was Monday and she feels constipated. Will order mag citrate. Patient worried about making it to bathroom on time. Advised that bedside commode will be placed next to her prior to administration.   2/1- 3 BMs in last 36 hours- last 1 at 2pm- hopefully feeling better.  2/9- all Senokot 1 tab BID and give Miralax- LBM 2/5.   17. Denuded skin under breasts- yeast?; placed WOC consult and will follow guidelines.    2/3- will add Diflucan 100 mg daily x 6 days for yeast adding to problems with yeast underneath breasts.   2/4- wounds are healing somewhat/so far.  18. Urinary frequency: 2/8: ordered UA/UC. UA personally removed and within normal limits. UC in process.   2/9- will try Flomax 0.4 mg QHS to see if will help with possible urinary retention.  19. ABLAnemia  2/9- Hb down to 7.1-  just continues to trend down- will d/w pt about transfusion.   2/10- 2.9 x11.4 cm mass- bleeding? Not sure where bleeding is coming from; has a hemoccult (+) as well, but CT of abd and pelvis is not c/w live  Adenomas bleeding still. Will call Gyn AND GI to see if recs.   2/11- GYN doesn't feel acute blood loss anemia due to GYN reasons- will check with GI- concerned that is multifactorial, but still, trending down too fast.  2/12- GI thinks due to internal hemorrhoids and ordered Anusol BID - will see if can get at home.   20. Hypothyroidism-  2/11- TSH 7.63- will increase Synthroid to 150 mcg   LOS: 15 days A FACE TO FACE EVALUATION WAS PERFORMED  Sankalp Ferrell 09/13/2019, 9:48 AM

## 2019-09-13 NOTE — Plan of Care (Signed)
  Problem: Consults Goal: RH GENERAL PATIENT EDUCATION Description: See Patient Education module for education specifics. Outcome: Progressing Goal: Skin Care Protocol Initiated - if Braden Score 18 or less Description: If consults are not indicated, leave blank or document N/A Outcome: Progressing Goal: Diabetes Guidelines if Diabetic/Glucose > 140 Description: If diabetic or lab glucose is > 140 mg/dl - Initiate Diabetes/Hyperglycemia Guidelines & Document Interventions  Outcome: Progressing   Problem: RH BLADDER ELIMINATION Goal: RH STG MANAGE BLADDER WITH ASSISTANCE Description: STG Manage Bladder With min/mod Assistance Outcome: Progressing   Problem: RH SKIN INTEGRITY Goal: RH STG SKIN FREE OF INFECTION/BREAKDOWN Description: Pt will be free of skin breakdown prior to DC with min/mod assist Outcome: Progressing Goal: RH STG MAINTAIN SKIN INTEGRITY WITH ASSISTANCE Description: STG Maintain Skin Integrity With min/mod Assistance. Outcome: Progressing Goal: RH STG ABLE TO PERFORM INCISION/WOUND CARE W/ASSISTANCE Description: STG Able To Perform Incision/Wound Care With mod Assistance. Outcome: Progressing   Problem: RH SAFETY Goal: RH STG ADHERE TO SAFETY PRECAUTIONS W/ASSISTANCE/DEVICE Description: STG Adhere to Safety Precautions With cues/reminders Assistance/Device. Outcome: Progressing   Problem: RH PAIN MANAGEMENT Goal: RH STG PAIN MANAGED AT OR BELOW PT'S PAIN GOAL Description: Less than 4 on 0-10 scale Outcome: Progressing

## 2019-09-13 NOTE — Progress Notes (Signed)
Physical Therapy Session Note  Patient Details  Name: [name] MRN: [id number] Date of Birth: April 04, 1971  Today's Date: 09/13/2019 PT Individual Time: 1345-1455 PT Individual Time Calculation (min): 70 min   Short Term Goals: Week 2:  PT Short Term Goal 1 (Week 2): STG=LTG due to ELOS. PT Short Term Goal 1 - Progress (Week 2): Progressing toward goal Week 3:  PT Short Term Goal 1 (Week 3): STG=LTG due to ELOS.  Skilled Therapeutic Interventions/Progress Updates:   Discussed fall yesterday and fall recovery education. Moved BSC to preferred location to improve safety. Bed mobility to come to EOB mod I with rail and performed toileting at mod I level for +urine including standing balance for hygiene. RN set up IV once patient transferred to w/c via RW. Discussed rollator vs RW at d/c and CSW checking on if insurance will cover RW - pt prefers RW from stability standpoint and would recommend this as well. Focused on stair negotiation for home entry practice x 4 steps at a time with bilateral handrails x 3 reps total with seated rest break between trials. Vitals assess each time and lowest O2 reading was 90% with highest HR = 100 bpm. Increased work of breathing noted with activity. Pt requires light min assist for first attempt but CGA for the rest of the trials with good awareness of technique. Discussed overall picture and required energy to enter home including short distance gait from where car parked, 8 steps to negotiate, and then short walk in home to her sofa. Pt unsure if she could complete that today. Encouraged to continue to think about this with practice in therapy sessions over next couple days. She does state there could be a chair placed on the porch to break up the walk from the stairs to where her sofa is. Gait x 60' with supervision due to IV pole management for functional mobility training and overall endurance. Returned to bed end of session with overall supervision (air  mattress) including BLE management herself. All needs in reach.   Therapy Documentation Precautions:  Precautions Precautions: Fall Restrictions Weight Bearing Restrictions: No   Vital Signs: Therapy Vitals Pulse Rate: 98 Oxygen Therapy SpO2: 90 %(after stairs) O2 Device: Room Air  Pain:  Denies pain.   Therapy/Group: Individual Therapy  Canary Brim Ivory Broad, PT, DPT, CBIS  09/13/2019, 3:19 PM

## 2019-09-13 NOTE — Progress Notes (Signed)
Occupational Therapy Session Note  Patient Details  Name: [name] MRN: [id number] Date of Birth: 12/13/1970  Today's Date: 09/13/2019 OT Individual Time: 0830-0930 OT Individual Time Calculation (min): 60 min    Short Term Goals: Week 1:  OT Short Term Goal 1 (Week 1): Patient will complete bed mobility with mod A of one OT Short Term Goal 1 - Progress (Week 1): Met OT Short Term Goal 2 (Week 1): patient will complete sit to stand and SPT with RW min A of one OT Short Term Goal 2 - Progress (Week 1): Met OT Short Term Goal 3 (Week 1): patient will complete UB bathing/dressing with set up OT Short Term Goal 3 - Progress (Week 1): Met OT Short Term Goal 4 (Week 1): patient will complete LB bathing/dressing with mod A and demonstrate good carryover of AD use OT Short Term Goal 4 - Progress (Week 1): Met OT Short Term Goal 5 (Week 1): patient will tolerate standing for grooming tasks at sink for 5 minutes with CGA OT Short Term Goal 5 - Progress (Week 1): Met  Skilled Therapeutic Interventions/Progress Updates:    1:1 self care retraining at sink level (pt's choice). Pt able to perform bed mobility with supervision with extra time due to bed inflation. Pt ambulated to sink with AD with supervision. Pt bathe all parts with setup of supplies sit to stand from w/c (without cushion). Pt choose not to bathe feet today. Assisted with cleaning underneath right breast and applying clean dry abd pad. Pt performed toileting with setup of supplies and transfer mod I at a slow rate. Pt report continuing to feel fatigued today but able to continue with basic ADL tasks with rest breaks as needed. returned to bed with supervision with extra time to get both LEs in the bed.   Therapy Documentation Precautions:  Precautions Precautions: Fall Precaution Comments: 2L O2 via [place] Restrictions Weight Bearing Restrictions: No General:   Vital Signs: Therapy Vitals Pulse Rate: 83 BP: 140/76 Patient  Position (if appropriate): Lying Oxygen Therapy O2 Device: Room Air Pain: C/o minor pain in right shoulder from fall last night. RN aware and applied cream and allowed for rest.   Therapy/Group: Individual Therapy  ,  Lynsey 09/13/2019, 9:33 AM  

## 2019-09-14 ENCOUNTER — Inpatient Hospital Stay (HOSPITAL_COMMUNITY): Payer: Medicaid Other | Admitting: Occupational Therapy

## 2019-09-14 ENCOUNTER — Inpatient Hospital Stay (HOSPITAL_COMMUNITY): Payer: Medicaid Other

## 2019-09-14 LAB — GLUCOSE, CAPILLARY
Glucose-Capillary: 87 mg/dL (ref 70–99)
Glucose-Capillary: 101 mg/dL — ABNORMAL HIGH (ref 70–99)
Glucose-Capillary: 126 mg/dL — ABNORMAL HIGH (ref 70–99)
Glucose-Capillary: 127 mg/dL — ABNORMAL HIGH (ref 70–99)

## 2019-09-14 NOTE — Progress Notes (Signed)
Occupational Therapy Session Note  Patient Details  Name: [name] MRN: [id number] Date of Birth: May 28, 1971  Today's Date: 09/14/2019 OT Individual Time: 1000-1045 OT Individual Time Calculation (min): 45 min    Short Term Goals: Week 3:  OT Short Term Goal 1 (Week 3): STG = LTG  Skilled Therapeutic Interventions/Progress Updates:    Patient in bed, noted pain/blisters on right foot - she states that medical team is aware.  She states that she would like to participate in therapy session but would like to limit walking due to blisters being on the bottom of her feet and hurt more when weight bearing.  Bed mobility completed with CS, short distance ambulation with RW CS.  Bed side commode transfer and toileting mod I (with appropriate set up) She completed UB endurance activities and core muscle strengthening without difficulty.  She returned to bed at close of session CS.  Tray table and call bell in reach.    Therapy Documentation Precautions:  Precautions Precautions: Fall Precaution Comments: 2L O2 via [place] Restrictions Weight Bearing Restrictions: No General:   Vital Signs: Therapy Vitals Temp: 98.4 F (36.9 C) Temp Source: Oral Pulse Rate: 82 Resp: 18 BP: 138/75 Patient Position (if appropriate): Lying Oxygen Therapy SpO2: 95 % O2 Device: Room Air Other Treatments:     Therapy/Group: Individual Therapy  Carlos Levering 09/14/2019, 7:40 AM

## 2019-09-14 NOTE — Progress Notes (Signed)
Patient is on NIV at this time tolerating it well.  

## 2019-09-14 NOTE — Progress Notes (Signed)
Occupational Therapy Session Note  Patient Details  Name: [name] MRN: [id number] Date of Birth: 1971-07-06  Today's Date: 09/14/2019 OT Individual Time: 0900-0910 OT Individual Time Calculation (min): 10 min    Short Term Goals: Week 1:  OT Short Term Goal 1 (Week 1): Patient will complete bed mobility with mod A of one OT Short Term Goal 1 - Progress (Week 1): Met OT Short Term Goal 2 (Week 1): patient will complete sit to stand and SPT with RW min A of one OT Short Term Goal 2 - Progress (Week 1): Met OT Short Term Goal 3 (Week 1): patient will complete UB bathing/dressing with set up OT Short Term Goal 3 - Progress (Week 1): Met OT Short Term Goal 4 (Week 1): patient will complete LB bathing/dressing with mod A and demonstrate good carryover of AD use OT Short Term Goal 4 - Progress (Week 1): Met OT Short Term Goal 5 (Week 1): patient will tolerate standing for grooming tasks at sink for 5 minutes with CGA OT Short Term Goal 5 - Progress (Week 1): Met  Skilled Therapeutic Interventions/Progress Updates:     1:1. Pt received in bed reportin pain in R shoulder. Pt elects to bathe at sink but requires time to toilet at beginning of session with MOD I for transfers and components after bringing RW to pt as it was out of reach of bed. O2 99% after toileting and HR 105. Pt gathers clothing seated in w/c with increased time after reaching forward to low drawer to rest and breathe with VC for pacing. Pt completes bathing at sit to stand level with S/U for items and 1 VC for use of LHSS when trying to problem solve washing feet and locking brakes prior to sit to stand to wash buttocks. Pt with 3 blisters on R foot 1 small pea sized and 2 quarter sized blisters. Pt reporting only wearing grip socks when ambulating so blinster are not from shoes rubbing. Pt O2 100% on RA after bathing. Pt grooms seated with MOD I. Pt dons socks EOB after applying lotion with set up. Exited session with pt seated  in bed and call light in reach  Therapy Documentation Precautions:  Precautions Precautions: Fall Precaution Comments: 2L O2 via [place] Restrictions Weight Bearing Restrictions: No General:   Vital Signs: Therapy Vitals Temp: 98.4 F (36.9 C) Temp Source: Oral Pulse Rate: 82 Resp: 18 BP: 138/75 Patient Position (if appropriate): Lying Oxygen Therapy SpO2: 95 % O2 Device: Room Air Pain: Pain Assessment Pain Scale: 0-10 Pain Score: 10-Worst pain ever Pain Type: Acute pain Pain Location: Shoulder Pain Orientation: Right Pain Radiating Towards: (right breast area) Pain Descriptors / Indicators: Aching;Discomfort;Sore;Radiating;Tender Pain Frequency: Intermittent Pain Onset: Gradual Patients Stated Pain Goal: 2 Pain Intervention(s): Medication (See eMAR) ADL: ADL Eating: Supervision/safety, Set up Where Assessed-Eating: Bed level Grooming: Minimal assistance Where Assessed-Grooming: Edge of bed Upper Body Bathing: Moderate assistance Where Assessed-Upper Body Bathing: Edge of bed Lower Body Bathing: Dependent Where Assessed-Lower Body Bathing: Edge of bed Upper Body Dressing: Minimal assistance Where Assessed-Upper Body Dressing: Edge of bed Lower Body Dressing: Dependent Where Assessed-Lower Body Dressing: Edge of bed Toileting: Dependent Vision   Perception    Praxis   Exercises:   Other Treatments:     Therapy/Group: Individual Therapy  Tonny Branch 09/14/2019, 8:17 AM

## 2019-09-14 NOTE — Progress Notes (Signed)
[facility] PHYSICAL MEDICINE & REHABILITATION PROGRESS NOTE   Subjective/Complaints:  No issues overnight.  No complaints of shoulder pain.  She does note some blisters on her right foot.  ROS; pt denies SOB, CP, endorses abd pain; denies N/V/D/C; denies vision changes;   Objective:   No results found. Recent Labs    09/12/19 0534  WBC 7.4  HGB 8.2*  HCT 25.0*  PLT 348   Recent Labs    09/12/19 0534 09/13/19 0517  NA 142 140  K 3.6 3.8  CL 109 112*  CO2 23 21*  GLUCOSE 90 78  BUN 10 13  CREATININE 1.24* 1.05*  CALCIUM 9.0 8.8*    Intake/Output Summary (Last 24 hours) at 09/14/2019 0906 Last data filed at 09/14/2019 0700 Gross per 24 hour  Intake 740 ml  Output 250 ml  Net 490 ml     Physical Exam: Vital Signs Blood pressure 138/75, pulse 82, temperature 98.4 F (36.9 C), temperature source Oral, resp. rate 18, height 5\' 8"  (1.727 m), weight (!) 239 kg, last menstrual period 03/24/2017, SpO2 95 %.  Morbidly obese female with BMI of 65; laying in bariatric bed; hair on top of head; smiling more;  NAD  HENT:  Head: Normocephalic and atraumatic.  Nose: Nose normal. Wearing O2 by [place] this AM Eyes:  Conjunctivae and EOM are normal.  Neck: No tracheal deviation present.  Cardiovascular: Normal rate and regular rhythm.  Respiratory:CTA B/L, however slightly decreased at bases still ;  GI: Soft. TTP over RUQ- no rebound; protuberant; ND, hyperactive BS  Musculoskeletal:        Cervical back: Normal range of motion and neck supple.     Comments: B/L UEs- deltoi 4-/5, biceps 4/5, triceps 4/5, WE 4/5, grip 4/5, finger abd 4-/5 LEs- HF 2+/5, KE 2+/5, DF/PF 4-/5.   Neurological:Sensation intact in all 4 extremities to light touch Skin: Skin is warm and extremely dry, esp on arms A little healing seen under L and between breasts under skin fold- smaller openings- under R breast, no significant change- still open/skin split with yeast notable.  1 IV in L forearm- no signs  of infiltration  Psychiatric:  Brighter affect; smiling some     Assessment/Plan: 1. Functional deficits secondary to debility due to liver bleeding adenoma and DKA/AKI which require 3+ hours per day of interdisciplinary therapy in a comprehensive inpatient rehab setting.  Physiatrist is providing close team supervision and 24 hour management of active medical problems listed below.  Physiatrist and rehab team continue to assess barriers to discharge/monitor patient progress toward functional and medical goals  Care Tool:  Bathing    Body parts bathed by patient: Right arm, Left arm, Chest, Abdomen, Front perineal area, Face, Right upper leg, Left upper leg, Right lower leg, Left lower leg, Buttocks   Body parts bathed by helper: Buttocks, Right lower leg, Left lower leg     Bathing assist Assist Level: Set up assist     Upper Body Dressing/Undressing Upper body dressing   What is the patient wearing?: Pull over shirt    Upper body assist Assist Level: Set up assist    Lower Body Dressing/Undressing Lower body dressing      What is the patient wearing?: Underwear/pull up, Pants     Lower body assist Assist for lower body dressing: Set up assist     Toileting Toileting    Toileting assist Assist for toileting: Supervision/Verbal cueing     Transfers Chair/bed transfer  Transfers  assist     Chair/bed transfer assist level: Independent with assistive device Chair/bed transfer assistive device: Walker, Clinical biochemist   Ambulation assist   Ambulation activity did not occur: Safety/medical concerns(bilateral LE weakness, fatigue, SOB)  Assist level: Supervision/Verbal cueing Assistive device: Walker-rolling Max distance: 60'   Walk 10 feet activity   Assist  Walk 10 feet activity did not occur: Safety/medical concerns(bilateral LE weakness, fatigue, SOB)  Assist level: Supervision/Verbal cueing Assistive device: Walker-rolling    Walk 50 feet activity   Assist Walk 50 feet with 2 turns activity did not occur: Safety/medical concerns(bilateral LE weakness, fatigue, SOB)  Assist level: Supervision/Verbal cueing Assistive device: Walker-rolling    Walk 150 feet activity   Assist Walk 150 feet activity did not occur: Safety/medical concerns(bilateral LE weakness, fatigue, SOB)         Walk 10 feet on uneven surface  activity   Assist Walk 10 feet on uneven surfaces activity did not occur: Safety/medical concerns(bilateral LE weakness, fatigue, SOB)         Wheelchair     Assist Will patient use wheelchair at discharge?: Yes Type of Wheelchair: Manual Wheelchair activity did not occur: Safety/medical concerns(fatigue, SOB, decreased endurance)         Wheelchair 50 feet with 2 turns activity    Assist    Wheelchair 50 feet with 2 turns activity did not occur: Safety/medical concerns(fatigue, SOB, decreased endurance)       Wheelchair 150 feet activity     Assist  Wheelchair 150 feet activity did not occur: Safety/medical concerns(fatigue, SOB, decreased endurance)       Blood pressure 138/75, pulse 82, temperature 98.4 F (36.9 C), temperature source Oral, resp. rate 18, height 5\' 8"  (1.727 m), weight (!) 239 kg, last menstrual period 03/24/2017, SpO2 95 %.  1.  Impaired mobility, ADLs and function secondary to debility due to hemrrhagic shock due to bleeding adenoma, AKI, and DKA             -patient may  shower             -ELOS/Goals: 7-10 days/ supervision to min assist  -Continue CIR PT and OT 2.  Antithrombotics: -DVT/anticoagulation:  Mechanical: Sequential compression devices, below knee Bilateral lower extremities  -would benefit from Lovenox- will see if possible since her size and immobility             -antiplatelet therapy: N/A 3. Pain Management: Need to educated/encourage patient to work thorough pain as > 7 days post embolization. Continue MS contin with  MSIR  2/6- stopped MS contin and continued MSIR and tylenol- pt still says pain better controlled   2/8: R sided chest pain worse with leaning forward. Tenderness to palpation suggestive of costochondritis. Will add lidocaine patch to right chest wall.  4. Mood: LCSW to follow for evaluation and support.              -antipsychotic agents: N/A 5. Neuropsych: This patient is capable of making decisions on her own behalf. 6. Skin/Wound Care:  Antimicrobial fabric to absorb moisture to assist in management of MASD.  Small bullae on right greater than left foot.  May be skin manifestation of SLE in combination with sulfonamide, on Flomax.  Will hold this and see if skin lesions improve. 7. Fluids/Electrolytes/Nutrition: Continue Ensure max. Will add prostat for depleted protein stores.  8. Liver adenomas with bleeding s/p embolization: Continue to monitor H/H.  2/10- transfused 1 units; but has a  anterior uterus 11/4 x3cm mass- ? Bleeding-   2/12- not bleeding per CT or GI 9. OSA/OHS: Continue 2L oxygen per [place]--encourage CPAP at nights. Continue Dulera bid 10. T2DM: Hgb A1c- 8.6--> improving/sees Dr. Loanne Drilling. Continue to monitor BS ac/hs and titrate lantus as indicated--intake poor at this time. Continue Lantus with SSI  CBG (last 3)  Recent Labs    09/13/19 1641 09/13/19 2119 09/14/19 0625  GLUCAP 105* 92 87  controlled 2/13   11. Acute on chronic renal failure resolved  12. Chronic combined systolic CHF: Check weights daily. Monitor for signs of overload. On Entresto, pravastatin, Bidil and coreg.    Filed Weights   09/07/19 0521 09/09/19 0457 09/12/19 0601  Weight: 133 kg (!) 165 kg (!) 239 kg    Measurement error vs documentation repeat today 13. H/o Lupus: On Plaquenil bid with prednisone. On Lyrica for pain management.   14. H/o anxiety/depression: On Wellbutrin and cymbalta with Xanax prn. - getting over recent death of brother.   09/20/22: Likely exacerbated by SOB. On [place] 2L, satting  above 96%. 15. Hematuria?/Vaginal bleeding?: Nuvaring out PTA. Reports heavy bleeding off megestrol--?resume.   2/11- bleeding vaginally stopped 16. Constipation: 20-Sep-2022: patient states last BM was Monday and she feels constipated. Will order mag citrate. Patient worried about making it to bathroom on time. Advised that bedside commode will be placed next to her prior to administration.   2/1- 3 BMs in last 36 hours- last 1 at 2pm- hopefully feeling better.  2/9- all Senokot 1 tab BID and give Miralax- LBM 2/5.   17. Denuded skin under breasts- yeast?; placed WOC consult and will follow guidelines.    2/3- will add Diflucan 100 mg daily x 6 days for yeast adding to problems with yeast underneath breasts.   2/4- wounds are healing somewhat/so far.  18. Urinary frequency: 2/8: ordered UA/UC. UA personally removed and within normal limits. UC in process.   2/9- will try Flomax 0.4 mg QHS to see if will help with possible urinary retention.  19. ABLAnemia  2/9- Hb down to 7.1- just continues to trend down- will d/w pt about transfusion.   2/10- 2.9 x11.4 cm mass- bleeding? Not sure where bleeding is coming from; has a hemoccult (+) as well, but CT of abd and pelvis is not c/w live  Adenomas bleeding still. Will call Gyn AND GI to see if recs.   2/11- GYN doesn't feel acute blood loss anemia due to GYN reasons- will check with GI- concerned that is multifactorial, but still, trending down too fast.  2/12- GI thinks due to internal hemorrhoids and ordered Anusol BID - will see if can get at home.  hgb 8.2 20. Hypothyroidism-  2/11- TSH 7.63- will increase Synthroid to 150 mcg   LOS: 16 days A FACE TO FACE EVALUATION WAS PERFORMED  [name] 09/14/2019, 9:06 AM

## 2019-09-15 ENCOUNTER — Inpatient Hospital Stay (HOSPITAL_COMMUNITY): Payer: Medicaid Other

## 2019-09-15 LAB — GLUCOSE, CAPILLARY
Glucose-Capillary: 88 mg/dL (ref 70–99)
Glucose-Capillary: 96 mg/dL (ref 70–99)
Glucose-Capillary: 102 mg/dL — ABNORMAL HIGH (ref 70–99)
Glucose-Capillary: 145 mg/dL — ABNORMAL HIGH (ref 70–99)

## 2019-09-15 NOTE — Progress Notes (Signed)
Physical Therapy Session Note  Patient Details  Name: [name] MRN: [id number] Date of Birth: 1971-06-29  Today's Date: 09/15/2019 PT Individual Time: 0915-1015 PT Individual Time Calculation (min): 60 min   Short Term Goals: Week 3:  PT Short Term Goal 1 (Week 3): STG=LTG due to ELOS.  Skilled Therapeutic Interventions/Progress Updates:     Patient in bed with RN in room upon PT arrival. Patient alert and agreeable to PT session. Patient reported 3-4/10 shoulder and foot/blister pain during session, RN made aware. PT provided repositioning, rest breaks, and distraction as pain interventions throughout session. RN applied dressing to blisters on patient's R foot prior to mobility. Educated patient about wearing non-skid socks, rather than shoes, for mobility to maintain skin integrity with new blisters. Focused session on home mobility and education on exercise promotion and disease management to improve quality of life.   Therapeutic Activity: Bed Mobility: Patient performed supine to/from sit with x1 in the hospital bed with use of bed rails and x2 in ADL bed with rolling R/L with mod I for increased time.  Transfers: Patient performed sit to/from stand and stand pivot transfers from the hospital bed, ADL bed, w/c, and BSC with mod I using a bariatric RW. She performed furniture transfer from the ADL couch with min A requiring 2 attempts due to low surface and from the ADL recliner with CGA for safety/balance. Provided verbal cues for foot placement and leaning far forward to stand. Educated on sitting on surfaces that are more elevated or adding and thick cushion or pillow to increase elevation of lower surfaces in her home.   Gait Training:  Patient ambulated 14 feet using RW with supervision. Ambulated with decreased gait speed, decreased step length and height, increased BOS, and mild forward trunk lean. Provided verbal cues for erect posture, paced breathing for reduced SOB with  activity, and increased step height for reduced fall risk.   Discussed upcoming d/c, patient expressed concerns about her medical status and having to return to the hospital for masses in the future. PT encourage her to discuss these concerns with the MD in the morning to determine her follow-up care and understand the expectations regarding her condition. She also shared that she would like to be able to do more than she was doing prior to admission. She stated that she understands that many of her limitation are due to her weight. PT encouraged her to continue working on her mobility upon d/c, as she has while in rehab, and to express her goals with HHPT to begin incorporating physical activity into her daily routine. Also, discussed her fall a few days ago and educated patient on fall risk/prevention, home modifications to prevent falls, and activation of emergency services in the event of a fall. Patient accepting and appreciative of all education.   Patient in bed at end of session with breaks locked and all needs within reach.    Therapy Documentation Precautions:  Precautions Precautions: Fall Precaution Comments: 2L O2 via [place] Restrictions Weight Bearing Restrictions: No    Therapy/Group: Individual Therapy  Thanh Pomerleau L Conley Delisle PT, DPT  09/15/2019, 12:26 PM

## 2019-09-15 NOTE — Progress Notes (Signed)
[place] [facility] PHYSICAL MEDICINE & REHABILITATION PROGRESS NOTE   Subjective/Complaints:  Discussed blisters on RIght foot , no pain  ROS; pt denies SOB, CP, endorses abd pain; denies N/V/D/C; denies vision changes;   Objective:   No results found. No results for input(s): WBC, HGB, HCT, PLT in the last 72 hours. Recent Labs    09/13/19 0517  NA 140  K 3.8  CL 112*  CO2 21*  GLUCOSE 78  BUN 13  CREATININE 1.05*  CALCIUM 8.8*    Intake/Output Summary (Last 24 hours) at 09/15/2019 0916 Last data filed at 09/15/2019 0758 Gross per 24 hour  Intake 837 ml  Output --  Net 837 ml     Physical Exam: Vital Signs Blood pressure 130/65, pulse 90, temperature 98.5 F (36.9 C), resp. rate 18, height 5\' 8"  (1.727 m), weight (!) 239 kg, last menstrual period 03/24/2017, SpO2 94 %.  Morbidly obese female with BMI of 65; laying in bariatric bed; hair on top of head; smiling more;  NAD  HENT:  Head: Normocephalic and atraumatic.  Nose: Nose normal. Wearing O2 by [place] this AM Eyes:  Conjunctivae and EOM are normal.  Neck: No tracheal deviation present.  Cardiovascular: Normal rate and regular rhythm.  Respiratory:CTA B/L, however slightly decreased at bases still ;  GI: Soft. TTP over RUQ- no rebound; protuberant; ND, hyperactive BS  Musculoskeletal:        Cervical back: Normal range of motion and neck supple.     Comments: B/L UEs- deltoi 4-/5, biceps 4/5, triceps 4/5, WE 4/5, grip 4/5, finger abd 4-/5 LEs- HF 2+/5, KE 2+/5, DF/PF 4-/5.   Neurological:Sensation intact in all 4 extremities to light touch Skin: Bullae RIght foot, lateral aspect and heel as well as plantar surface, also scars from previous bullae.  No erythema  or swelling Psychiatric:  Flat    Assessment/Plan: 1. Functional deficits secondary to debility due to liver bleeding adenoma and DKA/AKI which require 3+ hours per day of interdisciplinary therapy in a comprehensive inpatient rehab setting.  Physiatrist is  providing close team supervision and 24 hour management of active medical problems listed below.  Physiatrist and rehab team continue to assess barriers to discharge/monitor patient progress toward functional and medical goals  Care Tool:  Bathing    Body parts bathed by patient: Right arm, Left arm, Chest, Abdomen, Front perineal area, Face, Right upper leg, Left upper leg, Right lower leg, Left lower leg, Buttocks   Body parts bathed by helper: Buttocks, Right lower leg, Left lower leg     Bathing assist Assist Level: Set up assist     Upper Body Dressing/Undressing Upper body dressing   What is the patient wearing?: Pull over shirt    Upper body assist Assist Level: Set up assist    Lower Body Dressing/Undressing Lower body dressing      What is the patient wearing?: Underwear/pull up, Pants     Lower body assist Assist for lower body dressing: Set up assist     Toileting Toileting    Toileting assist Assist for toileting: Supervision/Verbal cueing     Transfers Chair/bed transfer  Transfers assist     Chair/bed transfer assist level: Independent with assistive device Chair/bed transfer assistive device: Walker, Armrests   Locomotion Ambulation   Ambulation assist   Ambulation activity did not occur: Safety/medical concerns(bilateral LE weakness, fatigue, SOB)  Assist level: Supervision/Verbal cueing Assistive device: Walker-rolling Max distance: 60'   Walk 10 feet activity   Assist  Walk 10 feet activity did not occur: Safety/medical concerns(bilateral LE weakness, fatigue, SOB)  Assist level: Supervision/Verbal cueing Assistive device: Walker-rolling   Walk 50 feet activity   Assist Walk 50 feet with 2 turns activity did not occur: Safety/medical concerns(bilateral LE weakness, fatigue, SOB)  Assist level: Supervision/Verbal cueing Assistive device: Walker-rolling    Walk 150 feet activity   Assist Walk 150 feet activity did not  occur: Safety/medical concerns(bilateral LE weakness, fatigue, SOB)         Walk 10 feet on uneven surface  activity   Assist Walk 10 feet on uneven surfaces activity did not occur: Safety/medical concerns(bilateral LE weakness, fatigue, SOB)         Wheelchair     Assist Will patient use wheelchair at discharge?: Yes Type of Wheelchair: Manual Wheelchair activity did not occur: Safety/medical concerns(fatigue, SOB, decreased endurance)         Wheelchair 50 feet with 2 turns activity    Assist    Wheelchair 50 feet with 2 turns activity did not occur: Safety/medical concerns(fatigue, SOB, decreased endurance)       Wheelchair 150 feet activity     Assist  Wheelchair 150 feet activity did not occur: Safety/medical concerns(fatigue, SOB, decreased endurance)       Blood pressure 130/65, pulse 90, temperature 98.5 F (36.9 C), resp. rate 18, height 5\' 8"  (1.727 m), weight (!) 239 kg, last menstrual period 03/24/2017, SpO2 94 %.  1.  Impaired mobility, ADLs and function secondary to debility due to hemrrhagic shock due to bleeding adenoma, AKI, and DKA             -patient may  shower             -ELOS/Goals: 7-10 days/ supervision to min assist  -Continue CIR PT and OT 2.  Antithrombotics: -DVT/anticoagulation:  Mechanical: Sequential compression devices, below knee Bilateral lower extremities  -would benefit from Lovenox- will see if possible since her size and immobility             -antiplatelet therapy: N/A 3. Pain Management: Need to educated/encourage patient to work thorough pain as > 7 days post embolization. Continue MS contin with MSIR  2/6- stopped MS contin and continued MSIR and tylenol- pt still says pain better controlled   2/8: R sided chest pain worse with leaning forward. Tenderness to palpation suggestive of costochondritis. Will add lidocaine patch to right chest wall.  4. Mood: LCSW to follow for evaluation and support.               -antipsychotic agents: N/A 5. Neuropsych: This patient is capable of making decisions on her own behalf. 6. Skin/Wound Care:  Antimicrobial fabric to absorb moisture to assist in management of MASD.  Small bullae on rightfoot.  May be skin manifestation of SLE in combination with sulfonamide, on Flomax.  Will hold this and see if skin lesions improve.WIll ask RN to pain bullae with betadine and wrap with Kerlix daily 7. Fluids/Electrolytes/Nutrition: Continue Ensure max. Will add prostat for depleted protein stores.  8. Liver adenomas with bleeding s/p embolization: Continue to monitor H/H.  2/10- transfused 1 units; but has a anterior uterus 11/4 x3cm mass- ? Bleeding-   2/12- not bleeding per CT or GI 9. OSA/OHS: Continue 2L oxygen per [place]--encourage CPAP at nights. Continue Dulera bid 10. T2DM: Hgb A1c- 8.6--> improving/sees Dr. Loanne Drilling. Continue to monitor BS ac/hs and titrate lantus as indicated--intake poor at this time. Continue Lantus with SSI  CBG (last 3)  Recent Labs    09/14/19 1651 09/14/19 2102 09/15/19 0619  GLUCAP 126* 127* 88  controlled 2/14   11. Acute on chronic renal failure resolved  12. Chronic combined systolic CHF: Check weights daily. Monitor for signs of overload. On Entresto, pravastatin, Bidil and coreg.    Filed Weights   09/07/19 0521 09/09/19 0457 09/12/19 0601  Weight: 133 kg (!) 165 kg (!) 239 kg    Measurement error vs documentation repeat today 13. H/o Lupus: On Plaquenil bid with prednisone. On Lyrica for pain management.   14. H/o anxiety/depression: On Wellbutrin and cymbalta with Xanax prn. - getting over recent death of brother.   2022-09-13: Likely exacerbated by SOB. On [place] 2L, satting above 96%. 15. Hematuria?/Vaginal bleeding?: Nuvaring out PTA. Reports heavy bleeding off megestrol--?resume.   2/11- bleeding vaginally stopped 16. Constipation: 09/13/22: patient states last BM was Monday and she feels constipated. Will order mag citrate. Patient worried  about making it to bathroom on time. Advised that bedside commode will be placed next to her prior to administration.   2/1- 3 BMs in last 36 hours- last 1 at 2pm- hopefully feeling better.  2/9- all Senokot 1 tab BID and give Miralax- LBM 2/5.   17. Denuded skin under breasts- yeast?; placed WOC consult and will follow guidelines.    2/3- will add Diflucan 100 mg daily x 6 days for yeast adding to problems with yeast underneath breasts.   2/4- wounds are healing somewhat/so far.  18. Urinary frequency: 2/8: ordered UA/UC. UA personally removed and within normal limits. UC in process.   2/9- will try Flomax 0.4 mg QHS to see if will help with possible urinary retention.  19. ABLAnemia  2/9- Hb down to 7.1- just continues to trend down- will d/w pt about transfusion.   2/10- 2.9 x11.4 cm mass- bleeding? Not sure where bleeding is coming from; has a hemoccult (+) as well, but CT of abd and pelvis is not c/w live  Adenomas bleeding still. Will call Gyn AND GI to see if recs.   2/11- GYN doesn't feel acute blood loss anemia due to GYN reasons- will check with GI- concerned that is multifactorial, but still, trending down too fast.  2/12- GI thinks due to internal hemorrhoids and ordered Anusol BID - will see if can get at home.  hgb 8.2 20. Hypothyroidism-  2/11- TSH 7.63- will increase Synthroid to 150 mcg   LOS: 17 days A FACE TO FACE EVALUATION WAS PERFORMED  Charlett Blake 09/15/2019, 9:16 AM

## 2019-09-15 NOTE — Plan of Care (Signed)
  Problem: Consults Goal: RH GENERAL PATIENT EDUCATION Description: See Patient Education module for education specifics. Outcome: Progressing Goal: Skin Care Protocol Initiated - if Braden Score 18 or less Description: If consults are not indicated, leave blank or document N/A Outcome: Progressing Goal: Diabetes Guidelines if Diabetic/Glucose > 140 Description: If diabetic or lab glucose is > 140 mg/dl - Initiate Diabetes/Hyperglycemia Guidelines & Document Interventions  Outcome: Progressing   Problem: RH BLADDER ELIMINATION Goal: RH STG MANAGE BLADDER WITH ASSISTANCE Description: STG Manage Bladder With min/mod Assistance Outcome: Progressing   Problem: RH SKIN INTEGRITY Goal: RH STG SKIN FREE OF INFECTION/BREAKDOWN Description: Pt will be free of skin breakdown prior to DC with min/mod assist Outcome: Progressing Goal: RH STG MAINTAIN SKIN INTEGRITY WITH ASSISTANCE Description: STG Maintain Skin Integrity With min/mod Assistance. Outcome: Progressing Goal: RH STG ABLE TO PERFORM INCISION/WOUND CARE W/ASSISTANCE Description: STG Able To Perform Incision/Wound Care With mod Assistance. Outcome: Progressing   Problem: RH SAFETY Goal: RH STG ADHERE TO SAFETY PRECAUTIONS W/ASSISTANCE/DEVICE Description: STG Adhere to Safety Precautions With cues/reminders Assistance/Device. Outcome: Progressing   Problem: RH PAIN MANAGEMENT Goal: RH STG PAIN MANAGED AT OR BELOW PT'S PAIN GOAL Description: Less than 4 on 0-10 scale Outcome: Progressing

## 2019-09-16 ENCOUNTER — Inpatient Hospital Stay (HOSPITAL_COMMUNITY): Payer: Medicaid Other

## 2019-09-16 ENCOUNTER — Inpatient Hospital Stay (HOSPITAL_COMMUNITY): Payer: Medicaid Other | Admitting: Occupational Therapy

## 2019-09-16 LAB — GLUCOSE, CAPILLARY
Glucose-Capillary: 94 mg/dL (ref 70–99)
Glucose-Capillary: 109 mg/dL — ABNORMAL HIGH (ref 70–99)
Glucose-Capillary: 116 mg/dL — ABNORMAL HIGH (ref 70–99)

## 2019-09-16 LAB — COMPREHENSIVE METABOLIC PANEL
ALT: 18 U/L (ref 0–44)
AST: 14 U/L — ABNORMAL LOW (ref 15–41)
Albumin: 2.7 g/dL — ABNORMAL LOW (ref 3.5–5.0)
Alkaline Phosphatase: 86 U/L (ref 38–126)
Anion gap: 10 (ref 5–15)
BUN: 11 mg/dL (ref 6–20)
CO2: 21 mmol/L — ABNORMAL LOW (ref 22–32)
Calcium: 9.2 mg/dL (ref 8.9–10.3)
Chloride: 110 mmol/L (ref 98–111)
Creatinine, Ser: 1.14 mg/dL — ABNORMAL HIGH (ref 0.44–1.00)
GFR calc Af Amer: >60 mL/min (ref >60)
GFR calc non Af Amer: 56 mL/min — ABNORMAL LOW (ref >60)
Glucose, Bld: 102 mg/dL — ABNORMAL HIGH (ref 70–99)
Potassium: 3.5 mmol/L (ref 3.5–5.1)
Sodium: 141 mmol/L (ref 135–145)
Total Bilirubin: 0.5 mg/dL (ref 0.3–1.2)
Total Protein: 6.3 g/dL — ABNORMAL LOW (ref 6.5–8.1)

## 2019-09-16 LAB — CBC
HCT: 29 % — ABNORMAL LOW (ref 36.0–46.0)
Hemoglobin: 9.1 g/dL — ABNORMAL LOW (ref 12.0–15.0)
MCH: 27.7 pg (ref 26.0–34.0)
MCHC: 31.4 g/dL (ref 30.0–36.0)
MCV: 88.4 fL (ref 80.0–100.0)
Platelets: 320 10*3/uL (ref 150–400)
RBC: 3.28 MIL/uL — ABNORMAL LOW (ref 3.87–5.11)
RDW: 16.1 % — ABNORMAL HIGH (ref 11.5–15.5)
WBC: 9 10*3/uL (ref 4.0–10.5)
nRBC: 0 % (ref 0.0–0.2)

## 2019-09-16 MED ORDER — ACETAMINOPHEN 325 MG PO TABS
650.0000 mg | ORAL_TABLET | ORAL | Status: DC | PRN
  Filled 2019-09-16: qty 2

## 2019-09-16 MED ORDER — FLUCONAZOLE 100 MG PO TABS
100.0000 mg | ORAL_TABLET | Freq: Every day | ORAL | Status: DC
  Administered 2019-09-17 – 2019-09-18 (×2): 100 mg via ORAL
  Filled 2019-09-16 (×2): qty 1

## 2019-09-16 MED ORDER — ZINC SULFATE 220 (50 ZN) MG PO CAPS
220.0000 mg | ORAL_CAPSULE | Freq: Every day | ORAL | Status: DC
  Administered 2019-09-16 – 2019-09-18 (×3): 220 mg via ORAL
  Filled 2019-09-16 (×3): qty 1

## 2019-09-16 MED ORDER — FLUCONAZOLE 100 MG PO TABS
200.0000 mg | ORAL_TABLET | Freq: Once | ORAL | Status: AC
  Administered 2019-09-16: 200 mg via ORAL
  Filled 2019-09-16: qty 2

## 2019-09-16 MED ORDER — ASCORBIC ACID 500 MG PO TABS
500.0000 mg | ORAL_TABLET | Freq: Two times a day (BID) | ORAL | Status: DC
  Administered 2019-09-16 – 2019-09-18 (×4): 500 mg via ORAL
  Filled 2019-09-16 (×5): qty 1

## 2019-09-16 MED ORDER — TRAMADOL HCL 50 MG PO TABS
50.0000 mg | ORAL_TABLET | Freq: Four times a day (QID) | ORAL | Status: DC | PRN
  Administered 2019-09-16: 22:00:00 100 mg via ORAL
  Filled 2019-09-16: qty 2

## 2019-09-16 NOTE — Progress Notes (Signed)
Occupational Therapy Session Note  Patient Details  Name: LILLYTH KUETHE MRN: [id number] Date of Birth: 01/25/1971  Today's Date: 09/16/2019 OT Individual Time: 1000-1100 OT Individual Time Calculation (min): 60 min    Short Term Goals: Week 3:  OT Short Term Goal 1 (Week 3): STG = LTG  Skilled Therapeutic Interventions/Progress Updates:    Patient seated in w/c, ready for therapy session requesting to do therapeutic "boxing" activities to work on endurance.  Completed both seated and standing boxing dynamic activities with focus on trunk mobility, posture and endurance - each task completed for 1 minute - CGA in stance due to her challenging herself.   Completed short distance ambulation with RW CS to/from tub transfer bench in tub/shower with CS - she is able to get both legs into tub with CS and min cues for positioning.  Returned to w/c with CS.  Reviewed use of walker bag and provided one for home use.   She remained in the w/c at close of session, call bell and tray table in reach.    Therapy Documentation Precautions:  Precautions Precautions: Fall Precaution Comments: 2L O2 via [place] Restrictions Weight Bearing Restrictions: No General:   Vital Signs: Therapy Vitals Temp: 98.5 F (36.9 C) Pulse Rate: 85 BP: 123/76 Patient Position (if appropriate): Lying Oxygen Therapy SpO2: 100 %   Therapy/Group: Individual Therapy  Carlos Levering 09/16/2019, 7:44 AM

## 2019-09-16 NOTE — Progress Notes (Signed)
Per previous report, increased drainage and odor under right breast. 2-serous filled blisters to right lateral foot & right lateral heel. Compliant with wearing CPAP at HS. At 0002, PRN MSIR given for pain under right breast, rating pain 10 on pain scale. Slept better after MSIR given. [name] A

## 2019-09-16 NOTE — Progress Notes (Signed)
Physical Therapy Session Note  Patient Details  Name: [name] MRN: [id number] Date of Birth: 27-Sep-1970  Today's Date: 09/16/2019 PT Individual Time: 0800-0900 and 1330-1455 PT Individual Time Calculation (min): 60 min and 85 min  Short Term Goals: Week 3:  PT Short Term Goal 1 (Week 3): STG=LTG due to ELOS.  Skilled Therapeutic Interventions/Progress Updates:     Session 1: Patient in bed upon PT arrival. Patient alert and agreeable to PT session. Patient reported 6/10 R trunk pain during session, patient reported that she was pre-medicated prior to session. PT provided repositioning, rest breaks, and distraction as pain interventions throughout session. Limited ambulation this session due to blisters on R foot.  Patient expressed concerns about the wound under her R breast, stating, "I think it is getting worse." She also had questions about what caused the wound to occur. PT provided general education on skin breakdown and prevention. Dr. Dagoberto Ligas, MD, rounded during session and assessed wound, noted increased granulation tissue in wound bed. Patient also discussed concerns about the masses in her liver causing problems in the future. MD informed her that she would be following-up with a gastroenterologist to determine POC.  Patient had a few questions about her discussion with the doctor. Provided general education to help her understand the information provided. PT encouraged her to advocate for herself and communicate all concerns or questions that she has to the doctor. Also provided her with the strategy to write down all questions as she thinks of them and to provide the list to Dr. Dagoberto Ligas or take them to her GI doctor upon d/c to prevent forgetting important questions she has about her care. Patient appreciative and receptive to education.    Therapeutic Activity: Bed Mobility: Patient performed supine to/from sit independently in a flat bed without use of bed rails.  Transfers:  Patient performed sit to/from stand x2 and stand pivot bed>BSC>w/c and w/c>bed with mod I using the RW. Patient was continent of bladder and independent using a RW for peri-care and LB dressing during toileting.   Gait Training:  Patient ascended/descended 8 steps using B rails with CGA x2. Performed step-to gait pattern leading with R. Provided cues for technique and sequencing. Required a 4 min seated rest break in between due to fatigue. SPO2 94% HR 117 after first trial, SPO2 91% and HR 127 after second trial. Recovered to >95% and HR 100-105 in <2 min in sitting.   Wheelchair Mobility:  Patient was transported in the w/c with total A throughout session for energy conservation and time management.  Patient in w/c in room at end of session with breaks locked, bed alarm set, and all needs within reach.   Session 2: Patient in bed upon PT arrival. Patient alert and agreeable to PT session. Patient reported 6/10 R trunk pain and R foot tenderness in standing during session, RN made aware. PT provided repositioning, rest breaks, and distraction as pain interventions throughout session.   Therapeutic Activity: Bed Mobility: Patient performed bed mobility as above. Transfers: Patient performed transfers as above.  Five times Sit to Stand Test (FTSS) Method: Use a straight back chair with a solid seat that is 16-18" high. Ask participant to sit on the chair with arms folded across their chest.   Instructions: "Stand up and sit down as quickly as possible 5 times, keeping your arms folded across your chest."   Measurement: Stop timing when the participant stands the 5th time.  TIME: __42____ (in seconds)  Times >  13.6 seconds is associated with increased disability and morbidity (Guralnik, 2000) Times > 15 seconds is predictive of recurrent falls in healthy individuals aged 43 and older (Buatois, et al., 2008) Normal performance values in community dwelling individuals aged 39 and older  (Bohannon, 2006): o 60-69 years: 11.4 seconds o 70-79 years: 12.6 seconds o 80-89 years: 14.8 seconds  MCID: ? 2.3 seconds for Vestibular Disorders (Meretta, 2006)   Wheelchair Mobility:  Patient was transported in the w/c with total A throughout session for energy conservation and time management.  Neuromuscular Re-ed: Patient performed the Berg Balance Scale: Patient demonstrates increased fall risk as noted by score of 38/56 on Berg Balance Scale.  (<36= high risk for falls, close to 100%; 37-45 significant >80%; 46-51 moderate >50%; 52-55 lower >25%)  Therapeutic Exercise: Showed patient how to look up free exercise videos online. Utilized key words including: beginner, chair workout, and boxing to find suitable videos for patient's current activity level and interest. Encouraged her to find instructors that she liked and activities that she enjoyed with videos 10-30 min long. Encouraged her to perform 30 min or 2x10 min of this type of exercise a day to continue to increased daily activity at home.   Patient performed the following exercises with verbal and tactile cues for proper technique. Seated chair workout video for strengthening to initiate home exercise and increased activity: Performed forward and lateral arm raises, overhead press-ups, B hip flexion, alternating hip flexion, alternating hip flexion with alternating forward and lateral arm raises, and B leg raises each x30 sec  Patient in bed with RN in room at end of session with breaks locked and all needs within reach. Assessed R foot at end of session during dressing change. Once blister on her posterior heel had mild drainage, otherwise blisters remained intact and unchanged in size since yesterday. Patient did report significant tenderness to touch for the blisters on the bottom of her feet.     Therapy Documentation Precautions:  Precautions Precautions: Fall Precaution Comments: 2L O2 via [place] Restrictions Weight  Bearing Restrictions: No  Balance: Balance Balance Assessed: Yes Standardized Balance Assessment Standardized Balance Assessment: Berg Balance Test Berg Balance Test Sit to Stand: Able to stand  independently using hands Standing Unsupported: Able to stand safely 2 minutes Sitting with Back Unsupported but Feet Supported on Floor or Stool: Able to sit safely and securely 2 minutes Stand to Sit: Sits safely with minimal use of hands Transfers: Able to transfer safely, definite need of hands Standing Unsupported with Eyes Closed: Able to stand 10 seconds with supervision Standing Ubsupported with Feet Together: Able to place feet together independently and stand 1 minute safely From Standing, Reach Forward with Outstretched Arm: Can reach confidently >25 cm (10") From Standing Position, Pick up Object from Floor: Able to pick up shoe, needs supervision From Standing Position, Turn to Look Behind Over each Shoulder: Turn sideways only but maintains balance Turn 360 Degrees: Needs close supervision or verbal cueing Standing Unsupported, Alternately Place Feet on Step/Stool: Needs assistance to keep from falling or unable to try Standing Unsupported, One Foot in Front: Able to plae foot ahead of the other independently and hold 30 seconds Standing on One Leg: Unable to try or needs assist to prevent fall Total Score: 38/56    Therapy/Group: Individual Therapy  Renly Roots L Meggie Laseter PT, DPT  09/16/2019, 3:18 PM

## 2019-09-16 NOTE — Consult Note (Signed)
[facility] Nurse Consult Note: Patient receiving care in [facility] [phone]. Reason for Consult: Worsening condition under right breast Wound type: MASD-IT Pressure Injury POA: Yes/No/NA Measurement: 2.2 cm x 10 cm Wound bed: 100% pink Drainage (amount, consistency, odor) yellow on existing alginate dressing Periwound: intact Dressing procedure/placement/frequency: Place an Aquacel dressing against the wound (cut in half), [place] # [phone], to right breast fold, place InterDry over the Aquacel and allow the breast tissue to hold the dressing and textile in place. Replace the Aquacel daily. Monitor the wound area(s) for worsening of condition such as: Signs/symptoms of infection,  Increase in size,  Development of or worsening of odor, Development of pain, or increased pain at the affected locations.  Notify the medical team if any of these develop.  Thank you for the consult.  Discussed plan of care with the patient and bedside nurse.  [facility] nurse will not follow at this time.  Please re-consult the [facility] team if needed.  Val Riles, RN, MSN, CWOCN, CNS-BC, pager [phone]

## 2019-09-16 NOTE — Progress Notes (Signed)
[facility] PHYSICAL MEDICINE & REHABILITATION PROGRESS NOTE   Subjective/Complaints:  Pt reports R breast is getting worse underneath with skin breakdown- more uncomfortable.   L is doing OK- R side is the problem.   Also asking where would f/u/deal with liver adenomas- explained will have f/u with GI.    ROS; pt denies SOB, CP, endorses very mild abd pain; denies N/V/D/C; denies vision changes;   Objective:   No results found. Recent Labs    09/16/19 0619  WBC 9.0  HGB 9.1*  HCT 29.0*  PLT 320   Recent Labs    09/16/19 0619  NA 141  K 3.5  CL 110  CO2 21*  GLUCOSE 102*  BUN 11  CREATININE 1.14*  CALCIUM 9.2    Intake/Output Summary (Last 24 hours) at 09/16/2019 1543 Last data filed at 09/16/2019 0900 Gross per 24 hour  Intake 762 ml  Output --  Net 762 ml     Physical Exam: Vital Signs Blood pressure 119/67, pulse 79, temperature 98.4 F (36.9 C), resp. rate 20, height 5\' 8"  (1.727 m), weight (!) 244 kg, last menstrual period 03/24/2017, SpO2 97 %.  Morbidly obese female with BMI of 65; laying in bariatric bed; hair on top of head; sad this AM about R breast discomfort; PT in room  NAD  HENT:  Head: Normocephalic and atraumatic.  Nose: Nose normal. Wearing O2 by [place] this AM Eyes:  Conjunctivae and EOM are normal.  Neck: No tracheal deviation present.  Cardiovascular: Normal rate and regular rhythm.  Respiratory:CTA B/L, however slightly decreased at bases still ; Off O2 GI: Soft. TTP over RUQ- no rebound; protuberant; ND, hyperactive BS  Musculoskeletal:        Cervical back: Normal range of motion and neck supple.     Comments: B/L UEs- deltoi 4-/5, biceps 4/5, triceps 4/5, WE 4/5, grip 4/5, finger abd 4-/5 LEs- HF 2+/5, KE 2+/5, DF/PF 4-/5.   Neurological:Sensation intact in all 4 extremities to light touch Skin: Bullae RIght foot, lateral aspect and heel as well as plantar surface, also scars from previous bullae.  Has more superficial skin  breakdown under R breast ~ 1cm in width and 6-7 cm in length- red/angry appearing and less open skin tears however; smells strongly of yeast and yeasty appearing;  Psychiatric: Flat affect; but overall more sad appearing    Assessment/Plan: 1. Functional deficits secondary to debility due to liver bleeding adenoma and DKA/AKI which require 3+ hours per day of interdisciplinary therapy in a comprehensive inpatient rehab setting.  Physiatrist is providing close team supervision and 24 hour management of active medical problems listed below.  Physiatrist and rehab team continue to assess barriers to discharge/monitor patient progress toward functional and medical goals  Care Tool:  Bathing    Body parts bathed by patient: Right arm, Left arm, Chest, Abdomen, Front perineal area, Face, Right upper leg, Left upper leg, Right lower leg, Left lower leg, Buttocks   Body parts bathed by helper: Buttocks, Right lower leg, Left lower leg     Bathing assist Assist Level: Set up assist     Upper Body Dressing/Undressing Upper body dressing   What is the patient wearing?: Pull over shirt    Upper body assist Assist Level: Set up assist    Lower Body Dressing/Undressing Lower body dressing      What is the patient wearing?: Underwear/pull up, Pants     Lower body assist Assist for lower body dressing: Set up  assist     Toileting Toileting    Toileting assist Assist for toileting: Supervision/Verbal cueing     Transfers Chair/bed transfer  Transfers assist     Chair/bed transfer assist level: Independent with assistive device Chair/bed transfer assistive device: Programmer, multimedia   Ambulation assist   Ambulation activity did not occur: Safety/medical concerns(bilateral LE weakness, fatigue, SOB)  Assist level: Supervision/Verbal cueing Assistive device: Walker-rolling Max distance: 50'   Walk 10 feet activity   Assist  Walk 10 feet activity did not  occur: Safety/medical concerns(bilateral LE weakness, fatigue, SOB)  Assist level: Supervision/Verbal cueing Assistive device: Walker-rolling   Walk 50 feet activity   Assist Walk 50 feet with 2 turns activity did not occur: Safety/medical concerns(bilateral LE weakness, fatigue, SOB)  Assist level: Supervision/Verbal cueing Assistive device: Walker-rolling    Walk 150 feet activity   Assist Walk 150 feet activity did not occur: Safety/medical concerns(bilateral LE weakness, fatigue, SOB)         Walk 10 feet on uneven surface  activity   Assist Walk 10 feet on uneven surfaces activity did not occur: Safety/medical concerns(bilateral LE weakness, fatigue, SOB)         Wheelchair     Assist Will patient use wheelchair at discharge?: No(Patient is a functional ambulator) Type of Wheelchair: Manual Wheelchair activity did not occur: Safety/medical concerns(fatigue, SOB, decreased endurance)         Wheelchair 50 feet with 2 turns activity    Assist    Wheelchair 50 feet with 2 turns activity did not occur: Safety/medical concerns(fatigue, SOB, decreased endurance)       Wheelchair 150 feet activity     Assist  Wheelchair 150 feet activity did not occur: Safety/medical concerns(fatigue, SOB, decreased endurance)       Blood pressure 119/67, pulse 79, temperature 98.4 F (36.9 C), resp. rate 20, height 5\' 8"  (1.727 m), weight (!) 244 kg, last menstrual period 03/24/2017, SpO2 97 %.  1.  Impaired mobility, ADLs and function secondary to debility due to hemrrhagic shock due to bleeding adenoma, AKI, and DKA             -patient may  shower             -ELOS/Goals: 7-10 days/ supervision to min assist  -Continue CIR PT and OT 2.  Antithrombotics: -DVT/anticoagulation:  Mechanical: Sequential compression devices, below knee Bilateral lower extremities  -would benefit from Lovenox- will see if possible since her size and immobility              -antiplatelet therapy: N/A 3. Pain Management: Need to educated/encourage patient to work thorough pain as > 7 days post embolization. Continue MS contin with MSIR  2/6- stopped MS contin and continued MSIR and tylenol- pt still says pain better controlled   2/8: R sided chest pain worse with leaning forward. Tenderness to palpation suggestive of costochondritis. Will add lidocaine patch to right chest wall.  4. Mood: LCSW to follow for evaluation and support.              -antipsychotic agents: N/A 5. Neuropsych: This patient is capable of making decisions on her own behalf. 6. Skin/Wound Care:  Antimicrobial fabric to absorb moisture to assist in management of MASD.  Small bullae on rightfoot.  May be skin manifestation of SLE in combination with sulfonamide, on Flomax.  Will hold this and see if skin lesions improve.WIll ask RN to pain bullae with betadine and wrap with  Kerlix daily  2/15- called wound care nurse back about yeast under breasts R>>>L- also added Diflucan 200 mg x1 and 100 mg daily x 6 days- needs to keep area dry, however breasts are pendulous and heavy- makes it difficult. 7. Fluids/Electrolytes/Nutrition: Continue Ensure max. Will add prostat for depleted protein stores.  8. Liver adenomas with bleeding s/p embolization: Continue to monitor H/H.  2/10- transfused 1 units; but has a anterior uterus 11/4 x3cm mass- ? Bleeding-   2/12- not bleeding per CT or GI 9. OSA/OHS: Continue 2L oxygen per [place]--encourage CPAP at nights. Continue Dulera bid 10. T2DM: Hgb A1c- 8.6--> improving/sees Dr. Loanne Drilling. Continue to monitor BS ac/hs and titrate lantus as indicated--intake poor at this time. Continue Lantus with SSI  CBG (last 3)  Recent Labs    09/15/19 1727 09/15/19 2121 09/16/19 1148  GLUCAP 102* 145* 94  controlled 2/14   11. Acute on chronic renal failure resolved  12. Chronic combined systolic CHF: Check weights daily. Monitor for signs of overload. On Entresto, pravastatin,  Bidil and coreg.    Filed Weights   09/09/19 0457 09/12/19 0601 09/16/19 0453  Weight: (!) 165 kg (!) 239 kg (!) 244 kg    Measurement error vs documentation repeat today 13. H/o Lupus: On Plaquenil bid with prednisone. On Lyrica for pain management.   14. H/o anxiety/depression: On Wellbutrin and cymbalta with Xanax prn. - getting over recent death of brother.   08-Sep-2022: Likely exacerbated by SOB. On [place] 2L, satting above 96%. 15. Hematuria?/Vaginal bleeding?: Nuvaring out PTA. Reports heavy bleeding off megestrol--?resume.   2/11- bleeding vaginally stopped 16. Constipation: 09/08/22: patient states last BM was Monday and she feels constipated. Will order mag citrate. Patient worried about making it to bathroom on time. Advised that bedside commode will be placed next to her prior to administration.   2/1- 3 BMs in last 36 hours- last 1 at 2pm- hopefully feeling better.  2/9- all Senokot 1 tab BID and give Miralax- LBM 2/5.   17. Denuded skin under breasts- yeast?; placed WOC consult and will follow guidelines.    2/3- will add Diflucan 100 mg daily x 6 days for yeast adding to problems with yeast underneath breasts.   2/4- wounds are healing somewhat/so far.  18. Urinary frequency: 2/8: ordered UA/UC. UA personally removed and within normal limits. UC in process.   2/9- will try Flomax 0.4 mg QHS to see if will help with possible urinary retention.   2/15- held Flomax due to new bullae on feet- might be sulfa allergy? Or SLE issue.  19. ABLAnemia  2/9- Hb down to 7.1- just continues to trend down- will d/w pt about transfusion.   2/10- 2.9 x11.4 cm mass- bleeding? Not sure where bleeding is coming from; has a hemoccult (+) as well, but CT of abd and pelvis is not c/w live  Adenomas bleeding still. Will call Gyn AND GI to see if recs.   2/11- GYN doesn't feel acute blood loss anemia due to GYN reasons- will check with GI- concerned that is multifactorial, but still, trending down too fast.  2/12-  GI thinks due to internal hemorrhoids and ordered Anusol BID - will see if can get at home.  hgb 8.2  2/15- Hb up to 9.1- much improved? Con't treatment 20. Hypothyroidism-  2/11- TSH 7.63- will increase Synthroid to 150 mcg  2/15- reports Sx's slightly improved.    LOS: 18 days A FACE TO [place]  Dev Dhondt 09/16/2019, 3:43 PM

## 2019-09-17 ENCOUNTER — Inpatient Hospital Stay (HOSPITAL_COMMUNITY): Payer: Medicaid Other

## 2019-09-17 ENCOUNTER — Ambulatory Visit (HOSPITAL_COMMUNITY): Payer: Medicaid Other

## 2019-09-17 ENCOUNTER — Inpatient Hospital Stay (HOSPITAL_COMMUNITY): Payer: Medicaid Other | Admitting: Occupational Therapy

## 2019-09-17 LAB — GLUCOSE, CAPILLARY
Glucose-Capillary: 94 mg/dL (ref 70–99)
Glucose-Capillary: 93 mg/dL (ref 70–99)
Glucose-Capillary: 89 mg/dL (ref 70–99)
Glucose-Capillary: 157 mg/dL — ABNORMAL HIGH (ref 70–99)
Glucose-Capillary: 116 mg/dL — ABNORMAL HIGH (ref 70–99)

## 2019-09-17 NOTE — Progress Notes (Signed)
Occupational Therapy Session Note  Patient Details  Name: [name] MRN: [id number] Date of Birth: 21-Aug-1970  Today's Date: 09/17/2019 OT Individual Time: [id number] OT Individual Time Calculation (min): 77 min    Short Term Goals: Week 3:  OT Short Term Goal 1 (Week 3): STG = LTG  Skilled Therapeutic Interventions/Progress Updates:    patient seated in w/c, alert and ready for therapy session.  She is able to ambulate with RW and gather items needed for shower with CS.  Ambulation to shower seat with CS.  She is able to complete bathing with long handled sponge, hand held shower and grab bars mod I.  She completes dressing seated in chair with set up/mod I using dressing stick, reacher, sock aide (currently needs assist for wound care/dressing change right foot and under breasts) recommend shower with therapist or aide upon return home to review set up and assess her shower seat in tub/shower.  Reviewed discharge plan and safety recommendations - she demonstrates good understanding.  She remained seated in the w/c at close of session, call bell and tray table in reach.    Therapy Documentation Precautions:  Precautions Precautions: Fall Precaution Comments: 2L O2 via [place] Restrictions Weight Bearing Restrictions: No General:   Vital Signs: Therapy Vitals Temp: 98.5 F (36.9 C) Temp Source: Oral Pulse Rate: 79 BP: 117/65 Patient Position (if appropriate): Lying Oxygen Therapy SpO2: 100 % Therapy/Group: Individual Therapy  Carlos Levering 09/17/2019, 7:44 AM

## 2019-09-17 NOTE — Progress Notes (Signed)
[facility] PHYSICAL MEDICINE & REHABILITATION PROGRESS NOTE   Subjective/Complaints:  Saw wound care nursing yesterday- was told nursing wasn't cutting dressing/Aquacel the right size- and that dressing was balled up- was likely "balled up" because pt moving and they are not used to pts moving so much as I discussed with pt.    Measured 2.2cm x 10 cm (which is larger than guestimated).   Pt asking what care will get at d/c. For wounds and GI.  Explained will try to arrange wound care nursing at home to follow as well as wound care clinic f/u.    ROS; pt denies SOB, CP, endorses very mild abd pain; denies N/V/D/C; denies vision changes;   Objective:   No results found. Recent Labs    09/16/19 0619  WBC 9.0  HGB 9.1*  HCT 29.0*  PLT 320   Recent Labs    09/16/19 0619  NA 141  K 3.5  CL 110  CO2 21*  GLUCOSE 102*  BUN 11  CREATININE 1.14*  CALCIUM 9.2    Intake/Output Summary (Last 24 hours) at 09/17/2019 0920 Last data filed at 09/16/2019 1730 Gross per 24 hour  Intake 480 ml  Output --  Net 480 ml     Physical Exam: Vital Signs Blood pressure 117/65, pulse 79, temperature 98.5 F (36.9 C), temperature source Oral, resp. rate 20, height 5\' 8"  (1.727 m), weight (!) 243 kg, last menstrual period 03/24/2017, SpO2 100 %. Labs and vitals and previous day's notes reviewed   Morbidly obese female with BMI of 65;; hair on top of head; brighter, but still sad/flat appearing; quiet voice; laying supine in bariatric bed; NAD  HENT:  Head: Normocephalic and atraumatic.  Nose: Nose normal. Off O2 Eyes:  Conjunctivae and EOM are normal.  Neck: No tracheal deviation present.  Cardiovascular: Normal rate and regular rhythm.  Respiratory:CTA B/L, however slightly decreased at bases still ; Off O2 GI: Soft. TTP over RUQ- no rebound; protuberant; ND, hyperactive BS  Musculoskeletal:        Cervical back: Normal range of motion and neck supple.     Comments: B/L UEs-  deltoi 4-/5, biceps 4/5, triceps 4/5, WE 4/5, grip 4/5, finger abd 4-/5 LEs- HF 2+/5, KE 2+/5, DF/PF 4-/5.   Neurological:Sensation intact in all 4 extremities to light touch Skin: Bullae RIght foot, lateral aspect and heel as well as plantar surface, also scars from previous bullae. Unable to assess- dressing with dressing C/D/I- no drainage seen Has more superficial skin breakdown under R breast ~ 1cm in width and 6-7 cm in length- red/angry appearing and less open skin tears however; smells strongly of yeast and yeasty appearing;  Psychiatric:flat, sad affect- but did brighten when discussed d/c.     Assessment/Plan: 1. Functional deficits secondary to debility due to liver bleeding adenoma and DKA/AKI which require 3+ hours per day of interdisciplinary therapy in a comprehensive inpatient rehab setting.  Physiatrist is providing close team supervision and 24 hour management of active medical problems listed below.  Physiatrist and rehab team continue to assess barriers to discharge/monitor patient progress toward functional and medical goals  Care Tool:  Bathing    Body parts bathed by patient: Right arm, Left arm, Chest, Abdomen, Front perineal area, Face, Right upper leg, Left upper leg, Right lower leg, Left lower leg, Buttocks   Body parts bathed by helper: Buttocks, Right lower leg, Left lower leg     Bathing assist Assist Level: Set up assist  Upper Body Dressing/Undressing Upper body dressing   What is the patient wearing?: Pull over shirt    Upper body assist Assist Level: Set up assist    Lower Body Dressing/Undressing Lower body dressing      What is the patient wearing?: Underwear/pull up, Pants     Lower body assist Assist for lower body dressing: Set up assist     Toileting Toileting    Toileting assist Assist for toileting: Supervision/Verbal cueing     Transfers Chair/bed transfer  Transfers assist     Chair/bed transfer assist level:  Independent with assistive device Chair/bed transfer assistive device: Programmer, multimedia   Ambulation assist   Ambulation activity did not occur: Safety/medical concerns(bilateral LE weakness, fatigue, SOB)  Assist level: Supervision/Verbal cueing Assistive device: Walker-rolling Max distance: 50'   Walk 10 feet activity   Assist  Walk 10 feet activity did not occur: Safety/medical concerns(bilateral LE weakness, fatigue, SOB)  Assist level: Supervision/Verbal cueing Assistive device: Walker-rolling   Walk 50 feet activity   Assist Walk 50 feet with 2 turns activity did not occur: Safety/medical concerns(bilateral LE weakness, fatigue, SOB)  Assist level: Supervision/Verbal cueing Assistive device: Walker-rolling    Walk 150 feet activity   Assist Walk 150 feet activity did not occur: Safety/medical concerns(bilateral LE weakness, fatigue, SOB)         Walk 10 feet on uneven surface  activity   Assist Walk 10 feet on uneven surfaces activity did not occur: Safety/medical concerns(bilateral LE weakness, fatigue, SOB)         Wheelchair     Assist Will patient use wheelchair at discharge?: No(Patient is a functional ambulator) Type of Wheelchair: Manual Wheelchair activity did not occur: Safety/medical concerns(fatigue, SOB, decreased endurance)         Wheelchair 50 feet with 2 turns activity    Assist    Wheelchair 50 feet with 2 turns activity did not occur: Safety/medical concerns(fatigue, SOB, decreased endurance)       Wheelchair 150 feet activity     Assist  Wheelchair 150 feet activity did not occur: Safety/medical concerns(fatigue, SOB, decreased endurance)       Blood pressure 117/65, pulse 79, temperature 98.5 F (36.9 C), temperature source Oral, resp. rate 20, height 5\' 8"  (1.727 m), weight (!) 243 kg, last menstrual period 03/24/2017, SpO2 100 %.  1.  Impaired mobility, ADLs and function secondary to  debility due to hemrrhagic shock due to bleeding adenoma, AKI, and DKA             -patient may  shower             -ELOS/Goals: 7-10 days/ supervision to min assist  -Continue CIR PT and OT 2.  Antithrombotics: -DVT/anticoagulation:  Mechanical: Sequential compression devices, below knee Bilateral lower extremities  -would benefit from Lovenox- will see if possible since her size and immobility             -antiplatelet therapy: N/A 3. Pain Management: Need to educated/encourage patient to work thorough pain as > 7 days post embolization. Continue MS contin with MSIR  2/6- stopped MS contin and continued MSIR and tylenol- pt still says pain better controlled   2/8: R sided chest pain worse with leaning forward. Tenderness to palpation suggestive of costochondritis. Will add lidocaine patch to right chest wall.  4. Mood: LCSW to follow for evaluation and support.              -antipsychotic agents: N/A  5. Neuropsych: This patient is capable of making decisions on her own behalf. 6. Skin/Wound Care:  Antimicrobial fabric to absorb moisture to assist in management of MASD.  Small bullae on rightfoot.  May be skin manifestation of SLE in combination with sulfonamide, on Flomax.  Will hold this and see if skin lesions improve.WIll ask RN to pain bullae with betadine and wrap with Kerlix daily  2/15- called wound care nurse back about yeast under breasts R>>>L- also added Diflucan 200 mg x1 and 100 mg daily x 6 days- needs to keep area dry, however breasts are pendulous and heavy- makes it difficult.  2/16- was told nurses "balling up dressing" explained they aren't- pt is moving with therapy and this occurs.   -will have pt f/u with wound care nursing 3x/week at home as well as wound care clinic.  7. Fluids/Electrolytes/Nutrition: Continue Ensure max. Will add prostat for depleted protein stores.  8. Liver adenomas with bleeding s/p embolization: Continue to monitor H/H.  2/10- transfused 1 units;  but has a anterior uterus 11/4 x3cm mass- ? Bleeding-   2/12- not bleeding per CT or GI 9. OSA/OHS: Continue 2L oxygen per [place]--encourage CPAP at nights. Continue Dulera bid 10. T2DM: Hgb A1c- 8.6--> improving/sees Dr. Loanne Drilling. Continue to monitor BS ac/hs and titrate lantus as indicated--intake poor at this time. Continue Lantus with SSI  CBG (last 3)  Recent Labs    09/16/19 1643 09/16/19 2103 09/17/19 0600  GLUCAP 109* 116* 93  controlled 2/14   11. Acute on chronic renal failure resolved  12. Chronic combined systolic CHF: Check weights daily. Monitor for signs of overload. On Entresto, pravastatin, Bidil and coreg.    Filed Weights   09/12/19 0601 09/16/19 0453 09/17/19 0446  Weight: (!) 239 kg (!) 244 kg (!) 243 kg    2/16- pt weight when admitted was 195 kg- BMI was 65- now weight is measuring at 244 kg- I don't think she's gained that weight- I think it's documentation error- will address with nursing.  13. H/o Lupus: On Plaquenil bid with prednisone. On Lyrica for pain management.   14. H/o anxiety/depression: On Wellbutrin and cymbalta with Xanax prn. - getting over recent death of brother.    15. Hematuria?/Vaginal bleeding?: Nuvaring out PTA. Reports heavy bleeding off megestrol--?resume.   2/11- bleeding vaginally stopped 16. Constipation: 1/30: patient states last BM was Monday and she feels constipated. Will order mag citrate. Patient worried about making it to bathroom on time. Advised that bedside commode will be placed next to her prior to administration.   2/1- 3 BMs in last 36 hours- last 1 at 2pm- hopefully feeling better.  2/9- all Senokot 1 tab BID and give Miralax- LBM 2/5.   17. Denuded skin under breasts- yeast?; placed WOC consult and will follow guidelines.    2/3- will add Diflucan 100 mg daily x 6 days for yeast adding to problems with yeast underneath breasts.   2/4- wounds are healing somewhat/so far.   2/16- Wound care seen- already tried Diflucan but  ordered again- since so strong yeast odor/issue. Had pt seen by wound care again- will arrange for nursing to see at home as well to follow.  18. Urinary frequency: 2/8: ordered UA/UC. UA personally removed and within normal limits. UC in process.   2/9- will try Flomax 0.4 mg QHS to see if will help with possible urinary retention.   2/15- held Flomax due to new bullae on feet- might be sulfa allergy?  Or SLE issue.  19. ABLAnemia  2/9- Hb down to 7.1- just continues to trend down- will d/w pt about transfusion.   2/10- 2.9 x11.4 cm mass- bleeding? Not sure where bleeding is coming from; has a hemoccult (+) as well, but CT of abd and pelvis is not c/w live  Adenomas bleeding still. Will call Gyn AND GI to see if recs.   2/11- GYN doesn't feel acute blood loss anemia due to GYN reasons- will check with GI- concerned that is multifactorial, but still, trending down too fast.  2/12- GI thinks due to internal hemorrhoids and ordered Anusol BID - will see if can get at home.  hgb 8.2  2/15- Hb up to 9.1- much improved? Con't treatment 20. Hypothyroidism-  2/11- TSH 7.63- will increase Synthroid to 150 mcg  2/15- reports Sx's slightly improved 21/ Dispo  2/16- d/c tomorrow- will arrange f/u with GI at [facility], wound care f/u and H/H nursing.    LOS: 19 days A FACE TO FACE EVALUATION WAS PERFORMED  [name] 09/17/2019, 9:20 AM

## 2019-09-17 NOTE — Progress Notes (Signed)
Occupational Therapy Discharge Summary  Patient Details  Name: [name] MRN: [id number] Date of Birth: 12-Jun-1971     Patient has met 12 of 12 long term goals due to improved activity tolerance, improved balance, postural control and improved coordination.  Patient to discharge at overall Supervision level.  Patient's care partner is independent to provide the necessary physical assistance at discharge.  Cabrini has made tremendous gains in all areas of self care and mobility.  She has new blisters on right foot limiting her ability to walk distances and requires assistance for care and monitoring.  I recommend CS for dynamic standing activities and ambulation due to ongoing balance and strength impairment which she will continue to work on with home care therapists.  [name] has been a pleasure to work with as she is always pushing herself to improve.   She demonstrates readiness for return to home with support of her family.    Reasons goals not met: na  Recommendation:  Patient will benefit from ongoing skilled OT services in home health setting to continue to advance functional skills in the area of BADL and iADL.  Equipment: No equipment provided  Patient owns shower seat and bed side commode, she plans to order sock aide and reacher  Reasons for discharge: treatment goals met  Patient/family agrees with progress made and goals achieved: Yes  OT Discharge Precautions/Restrictions  Precautions Precautions: Fall Precaution Comments: O2 at hs, prn daytime Restrictions Weight Bearing Restrictions: No General   Vital Signs Therapy Vitals Temp: 98.5 F (36.9 C) Temp Source: Oral Pulse Rate: 79 BP: 117/65 Patient Position (if appropriate): Lying Oxygen Therapy SpO2: 100 % Pain   ADL ADL Equipment Provided: Long-handled sponge(reviewed and practiced with sock aide, reacher, dressing stick and shoe horn) Eating: Independent Where Assessed-Eating: Bed level Grooming:  Independent Where Assessed-Grooming: Sitting at sink Upper Body Bathing: Independent Where Assessed-Upper Body Bathing: Shower Lower Body Bathing: Modified independent Where Assessed-Lower Body Bathing: Shower Upper Body Dressing: Independent Where Assessed-Upper Body Dressing: Chair Lower Body Dressing: Supervision/safety Where Assessed-Lower Body Dressing: Chair Toileting: Modified independent Where Assessed-Toileting: Bedside Commode Toilet Transfer: Modified independent Toilet Transfer Method: Stand pivot Science writer: Extra wide bedside commode Tub/Shower Transfer: Close supervison Clinical cytogeneticist Method: Optometrist: Facilities manager: Close supervision Social research officer, government Method: Heritage manager: Radio broadcast assistant Vision Baseline Vision/History: Wears glasses Wears Glasses: At all times Patient Visual Report: No change from baseline Vision Assessment?: No apparent visual deficits Perception  Perception: Within Functional Limits Praxis Praxis: Intact Cognition Overall Cognitive Status: Within Functional Limits for tasks assessed Arousal/Alertness: Awake/alert Orientation Level: Oriented X4 Attention: Focused;Sustained Focused Attention: Appears intact Sustained Attention: Appears intact Memory: Appears intact Awareness: Appears intact Problem Solving: Appears intact Safety/Judgment: Appears intact Sensation Coordination Fine Motor Movements are Fluid and Coordinated: Yes Finger Nose Finger Test: [facility] Motor  Motor Motor: Other (comment) Motor - Discharge Observations: Generalized bilateral LE weakness and decreased activity tolerance Mobility  Bed Mobility Bed Mobility: Rolling Right;Rolling Left;Supine to Sit;Sit to Supine Rolling Right: Independent Rolling Left: Independent Supine to Sit: Independent Sit to Supine: Independent Transfers Sit to Stand: Independent with  assistive device Stand to Sit: Independent with assistive device  Trunk/Postural Assessment  Cervical Assessment Cervical Assessment: Within Functional Limits Thoracic Assessment Thoracic Assessment: Within Functional Limits Lumbar Assessment Lumbar Assessment: Exceptions to WFL(anterior pelvic tilt) Postural Control Postural Control: Deficits on evaluation  Balance Balance Balance Assessed: Yes Standardized Balance Assessment Standardized Balance Assessment:  Berg Balance Test Berg Balance Test Sit to Stand: Able to stand  independently using hands Standing Unsupported: Able to stand safely 2 minutes Sitting with Back Unsupported but Feet Supported on Floor or Stool: Able to sit safely and securely 2 minutes Stand to Sit: Sits safely with minimal use of hands Transfers: Able to transfer safely, definite need of hands Standing Unsupported with Eyes Closed: Able to stand 10 seconds with supervision Standing Ubsupported with Feet Together: Able to place feet together independently and stand 1 minute safely From Standing, Reach Forward with Outstretched Arm: Can reach confidently >25 cm (10") From Standing Position, Pick up Object from Floor: Able to pick up shoe, needs supervision From Standing Position, Turn to Look Behind Over each Shoulder: Turn sideways only but maintains balance Turn 360 Degrees: Needs close supervision or verbal cueing Standing Unsupported, Alternately Place Feet on Step/Stool: Needs assistance to keep from falling or unable to try Standing Unsupported, One Foot in Front: Able to plae foot ahead of the other independently and hold 30 seconds Standing on One Leg: Unable to try or needs assist to prevent fall Total Score: 38 Static Sitting Balance Static Sitting - Balance Support: No upper extremity supported;Feet supported Static Sitting - Level of Assistance: 7: Independent Dynamic Sitting Balance Dynamic Sitting - Balance Support: No upper extremity  supported;During functional activity;Feet supported Dynamic Sitting - Level of Assistance: 7: Independent Dynamic Sitting - Balance Activities: Lateral lean/weight shifting;Forward lean/weight shifting;Reaching for objects Static Standing Balance Static Standing - Balance Support: No upper extremity supported;During functional activity Static Standing - Level of Assistance: 7: Independent Dynamic Standing Balance Dynamic Standing - Balance Support: No upper extremity supported;During functional activity Dynamic Standing - Level of Assistance: 5: Stand by assistance Dynamic Standing - Balance Activities: Lateral lean/weight shifting;Forward lean/weight shifting;Reaching for objects Extremity/Trunk Assessment RUE Assessment RUE Assessment: Within Functional Limits LUE Assessment LUE Assessment: Within Functional Limits   Carlos Levering 09/17/2019, 7:57 AM

## 2019-09-17 NOTE — Patient Care Conference (Signed)
Inpatient RehabilitationTeam Conference and Plan of Care Update Date: 09/17/2019   Time: 11:30 AM   Patient Name: [name]      Medical Record Number: [id number]  Date of Birth: April 20, 1971 Sex: Female         Room/Bed: 4W01C/4W01C-01 Payor Info: Payor: MEDICAID [place] / Plan: MEDICAID [place] ACCESS / Product Type: *No Product type* /    Admit Date/Time:  08/29/2019  6:35 PM  Primary Diagnosis:  Physical debility  Patient Active Problem List   Diagnosis Date Noted  . Pressure injury of skin 08/30/2019  . Acute on chronic combined systolic and diastolic CHF (congestive heart failure) ([place]) 08/30/2019  . Physical debility 08/29/2019  . Obesity hypoventilation syndrome ([place]) 08/23/2019  . Skin breakdown 08/23/2019  . Essential hypertension 08/23/2019  . Liver hemorrhage 08/23/2019  . Acute blood loss anemia 08/13/2019  . Hepatic adenoma   . Hemoperitoneum   . Diabetic acidosis without coma ([place])   . Multifocal pneumonia 09/23/2017  . Acute respiratory failure ([place]) 09/21/2017  . CHF exacerbation ([place]) 09/20/2017  . Costochondritis 06/06/2017  . Chest pain 06/04/2017  . Diabetes ([place]) 09/19/2015  . Radiculopathy of cervical spine   . Severe headache 03/05/2014  . Family history of malignant neoplasm of breast   . Family history of malignant neoplasm of ovary   . Intractable nausea and vomiting 11/11/2013  . Septate uterus 09/08/2013  . Diabetic gastroparesis ([place]) 04/28/2013  . Constipation due to pain medication 04/21/2013  . Odynophagia 04/04/2013  . Liver masses 02/28/2013  . Reflux esophagitis 02/26/2013  . Morbid obesity ([place]) 10/10/2012  . Chronic diastolic CHF (congestive heart failure) ([place]) 10/09/2012  . Chronic respiratory failure with hypoxia ([place]) 10/09/2012  . Clinical polymyositis syndrome ([place]) 09/06/2012  . Mononeuritis of lower limb 01/12/2012  . HTN (hypertension) 11/08/2011  . Systolic CHF, acute on chronic ([place]) 11/08/2011  . HLD (hyperlipidemia)  07/19/2011  . Bell's palsy 08/09/2010  . SLE 08/09/2010  . [place] SYNDROME 08/09/2010    Expected Discharge Date: Expected Discharge Date: 09/18/19  Team Members Present: Physician leading conference: Dr. [name] Social Worker Present: Lennart Pall, LCSW(Auria Barbra Sarks, [place]) Nurse Present: Ellison Carwin, LPN Case Manager: Karene Fry, RN PT Present: Apolinar Junes, PT OT Present: Elisabeth Most, OT PPS Coordinator present : [name], SLP     Current Status/Progress Goal Weekly Team Focus  Bowel/Bladder   pt is continent of b/b, LBM 2/14  remain continent  Q2h toileting/PRN   Swallow/Nutrition/ Hydration             ADL's   UB adl and grooming - set up, LB adl CS with ADs, funcitonal transfers and bed mobility CS/mod I  supervision  family education, d/c prep   Mobility   Mod I bed mobility and transfers, supervision standing activity gait and car transfers, CGA 8 steps B rails  Supervision overall, CGA 7 steps B rails  Balance, strengthening, functional mobility, activity tolerance, d/c planning, patient/caregiver edu   Communication             Safety/Cognition/ Behavioral Observations            Pain   pt c/o 10/10  R breast and back pain. PRN morphine effective.  pain will be <3  Assess pain Qshift/PRN. treat as needed.   Skin   Stage 2 healing on buttocks. R breast wound- Wound care consult made and treating.  Remain free from further breakdown and infection.  Assess skinQshift/ PRN. dressing changes as  ordered    Rehab Goals Patient on target to meet rehab goals: Yes *See Care Plan and progress notes for long and short-term goals.     Barriers to Discharge  Current Status/Progress Possible Resolutions Date Resolved   Nursing                  PT     No barriers to discharge, patient at goal level and ready for d/c tomorrow.              OT                  SLP                SW                Discharge Planning/Teaching Needs:  Pt  to return home with friend who, along with family, will provide 24/7 assistance.  teaching completed   Team Discussion: Wound issues due to yeast and moisture, diflucan started, stopped flomax.  RN urgency better, cont B/B, lidocaine patch R chest, R breast drainage.  OT shower today, need to replace drsgs, blisters foot, transfers close S, shower mod I.  PT transfers mod I, S amb, 8 steps X 2 with B rails, car transfers, S at goal level now.   Revisions to Treatment Plan: N/A     Medical Summary Current Status: lidocaine to R chest- helpful- R breast has a lot of drainage- can't keep dressing in place; continent; no pain complaints Weekly Focus/Goal: OT- close SBA; dress with SBA; transfers mod I on level surface;  Barriers to Discharge: Behavior;Decreased family/caregiver support;Weight;Wound care  Barriers to Discharge Comments: d/c tomorrow- blisters now more hard- no drainage Possible Resolutions to Barriers: transfers Mod I; walking supervision- up/down 8 steps with B/L rails; family ed today; car supervision; limits ambulation for R foot.   Continued Need for Acute Rehabilitation Level of Care: The patient requires daily medical management by a physician with specialized training in physical medicine and rehabilitation for the following reasons: Direction of a multidisciplinary physical rehabilitation program to maximize functional independence : Yes Medical management of patient stability for increased activity during participation in an intensive rehabilitation regime.: Yes Analysis of laboratory values and/or radiology reports with any subsequent need for medication adjustment and/or medical intervention. : Yes   I attest that I was present, lead the team conference, and concur with the assessment and plan of the team.   Retta Diones 09/18/2019, 11:53 AM   Team conference was held via web/ teleconference due to [place] - 19

## 2019-09-17 NOTE — Progress Notes (Signed)
Physical Therapy Discharge Summary  Patient Details  Name: [name] MRN: [id number] Date of Birth: 23-Nov-1970   Patient has met 8 of 11 long term goals due to improved activity tolerance, improved balance, improved postural control, increased strength, decreased pain and ability to compensate for deficits.  Patient to discharge at an ambulatory level Supervision.   Patient's care partner is independent to provide the necessary physical assistance at discharge.  Reasons goals not met: Patient will not be using a w/c at d/c since she is a functional ambulator. She did not meet her controlled walking distance goal due to newly developed blisters on her R foot limiting prolonged weight bearing during the last 3 days of patient's stay. Patient able to tolerate ambulating household distances at d/c until blisters heal.  Recommendation:  Patient will benefit from ongoing skilled PT services in home health setting to continue to advance safe functional mobility, address ongoing impairments in balance, strength, activity tolerance, functional mobility, patient/caregiver education, and minimize fall risk.  Equipment: Bariatric RW  Reasons for discharge: treatment goals met  Patient/family agrees with progress made and goals achieved: Yes  PT Discharge Precautions/Restrictions Precautions Precautions: Fall Restrictions Weight Bearing Restrictions: No Vision/Perception  Perception Perception: Within Functional Limits Praxis Praxis: Intact  Cognition Overall Cognitive Status: Within Functional Limits for tasks assessed Arousal/Alertness: Awake/alert Attention: Focused;Sustained Focused Attention: Appears intact Sustained Attention: Appears intact Memory: Appears intact Awareness: Appears intact Problem Solving: Appears intact Safety/Judgment: Appears intact Sensation Sensation Light Touch: Appears Intact Proprioception: Appears Intact Additional Comments: LE sensation intact  bilaterally Coordination Gross Motor Movements are Fluid and Coordinated: No Fine Motor Movements are Fluid and Coordinated: Yes Coordination and Movement Description: decreased LE strength and activity tolerance Finger Nose Finger Test: [facility] Heel Shin Test: decreased bilaterally due to hip flexor weakness and body habitus Motor  Motor Motor: Other (comment) Motor - Discharge Observations: Generalized bilateral LE weakness and decreased activity tolerance  Mobility Bed Mobility Bed Mobility: Rolling Right;Rolling Left;Supine to Sit;Sit to Supine Rolling Right: Independent Rolling Left: Independent Supine to Sit: Independent Sit to Supine: Independent Transfers Transfers: Sit to Stand;Stand to Sit;Stand Pivot Transfers Sit to Stand: Independent with assistive device Stand to Sit: Independent with assistive device Stand Pivot Transfers: Independent with assistive device Transfer (Assistive device): Rolling walker Locomotion  Gait Ambulation: Yes Gait Assistance: Supervision/Verbal cueing Gait Distance (Feet): 50 Feet Assistive device: Rolling walker Gait Assistance Details: Verbal cues for gait pattern;Verbal cues for technique;Verbal cues for precautions/safety Gait Gait: Yes Gait Pattern: Step-through pattern;Decreased stride length;Decreased hip/knee flexion - right;Decreased hip/knee flexion - left;Trendelenburg;Lateral trunk lean to right;Lateral trunk lean to left;Decreased trunk rotation;Wide base of support Gait velocity: decreased Stairs / Additional Locomotion Stairs: Yes Stairs Assistance: Contact Guard/Touching assist Stair Management Technique: Two rails Number of Stairs: 8 Height of Stairs: 6 Wheelchair Mobility Wheelchair Mobility: No  Trunk/Postural Assessment  Cervical Assessment Cervical Assessment: Within Functional Limits Thoracic Assessment Thoracic Assessment: Within Functional Limits Lumbar Assessment Lumbar Assessment: Exceptions to WFL(anterior  pelvic tilt) Postural Control Postural Control: Deficits on evaluation  Balance Balance Balance Assessed: Yes Standardized Balance Assessment Standardized Balance Assessment: Berg Balance Test Berg Balance Test Sit to Stand: Able to stand  independently using hands Standing Unsupported: Able to stand safely 2 minutes Sitting with Back Unsupported but Feet Supported on Floor or Stool: Able to sit safely and securely 2 minutes Stand to Sit: Sits safely with minimal use of hands Transfers: Able to transfer safely, definite need of hands Standing Unsupported with Eyes  Closed: Able to stand 10 seconds with supervision Standing Ubsupported with Feet Together: Able to place feet together independently and stand 1 minute safely From Standing, Reach Forward with Outstretched Arm: Can reach confidently >25 cm (10") From Standing Position, Pick up Object from Floor: Able to pick up shoe, needs supervision From Standing Position, Turn to Look Behind Over each Shoulder: Turn sideways only but maintains balance Turn 360 Degrees: Needs close supervision or verbal cueing Standing Unsupported, Alternately Place Feet on Step/Stool: Needs assistance to keep from falling or unable to try Standing Unsupported, One Foot in Front: Able to plae foot ahead of the other independently and hold 30 seconds Standing on One Leg: Unable to try or needs assist to prevent fall Total Score: 38/56  Static Sitting Balance Static Sitting - Balance Support: No upper extremity supported;Feet supported Static Sitting - Level of Assistance: 7: Independent Dynamic Sitting Balance Dynamic Sitting - Balance Support: No upper extremity supported;During functional activity;Feet supported Dynamic Sitting - Level of Assistance: 7: Independent Dynamic Sitting - Balance Activities: Lateral lean/weight shifting;Forward lean/weight shifting;Reaching for objects Static Standing Balance Static Standing - Balance Support: No upper  extremity supported;During functional activity Static Standing - Level of Assistance: 7: Independent Dynamic Standing Balance Dynamic Standing - Balance Support: No upper extremity supported;During functional activity Dynamic Standing - Level of Assistance: 5: Stand by assistance Dynamic Standing - Balance Activities: Lateral lean/weight shifting;Forward lean/weight shifting;Reaching for objects Extremity Assessment  RUE Assessment RUE Assessment: Within Functional Limits LUE Assessment LUE Assessment: Within Functional Limits RLE Assessment RLE Assessment: Exceptions to [facility] General Strength Comments: grossly generalized to 4/5 LLE Assessment LLE Assessment: Exceptions to [facility] General Strength Comments: grossly generalized to 4/5    Jadelyn Elks L Makaleigh Reinard PT, DPT  09/17/2019, 7:43 AM

## 2019-09-17 NOTE — Progress Notes (Signed)
Occupational Therapy Session Note  Patient Details  Name: [name] MRN: [id number] Date of Birth: 11/11/1970  Today's Date: 09/17/2019 OT Individual Time: [id number] OT Individual Time Calculation (min): 45 min    Short Term Goals: Week 3:  OT Short Term Goal 1 (Week 3): STG = LTG  Skilled Therapeutic Interventions/Progress Updates:    Treatment session with focus on functional mobility and endurance during simulated home making tasks.  Pt received supine in bed reporting MD to return to assess and redress foot.  Provided pt with Energy conservation handouts and discussed energy conservation strategies for self-care tasks and homemaking activities.  Pt appreciative of education and handouts to share with family members.  Engaged in mobility in room to simulate mobility in bedroom and kitchen.  Pt able to retrieve items from bottom dresser drawer without assist, demonstrating good awareness of positioning.  Retrieved items from high shelf in closet while discussing retrieving items from cabinets and refrigerator in bathroom.  Reinforced use of AE as needed to obtain items and use of countertops to transport items in kitchen.  Pt appreciative of education and handouts.  Reports ready for d/c.  Therapy Documentation Precautions:  Precautions Precautions: Fall Precaution Comments: O2 at hs, prn daytime Restrictions Weight Bearing Restrictions: No General:   Vital Signs: Therapy Vitals Temp: 97.9 F (36.6 C) Pulse Rate: 86 Resp: 16 BP: 126/73 Patient Position (if appropriate): Sitting Oxygen Therapy SpO2: 99 % O2 Device: Room Air Pain:  Pt with c/o pain in foot.  MD and RN redressed at beginning of session.   Therapy/Group: Individual Therapy  Simonne Come 09/17/2019, 2:57 PM

## 2019-09-17 NOTE — Progress Notes (Signed)
Physical Therapy Session Note  Patient Details  Name: [name] MRN: [id number] Date of Birth: 1971/06/04  Today's Date: 09/17/2019 PT Individual Time: 0800-0900 and 1301-1346 PT Individual Time Calculation (min): 60 min   Short Term Goals: Week 3:  PT Short Term Goal 1 (Week 3): STG=LTG due to ELOS.  Skilled Therapeutic Interventions/Progress Updates:     Session 1: Patient in bed upon PT arrival. Patient alert and agreeable to PT session. Patient reported 6/10 R breast and R foot pain during session, reported that she was pre-medicated prior to session. PT provided repositioning, rest breaks, and distraction as pain interventions throughout session.   Therapeutic Activity: Bed Mobility: Patient performed supine to/from sit independently in a flat bed without use of bed rails. Transfers: Patient performed sit to/from stand x4 with and without an RW and stand pivot transfer with a RW with mod I.  Patient performed a simulated Jeep height car transfer with supervision using RW. Provided cues for safe technique.  Gait Training:  Patient ambulated up/down a ramp, over 10 feet of mulch (unlevel surface), and up/down a curb to simulate community ambulation over unlevel surfaces with CGA using RW. Provided cues for technique and use of AD.  Wheelchair Mobility:  Patient was transported in the w/c with total A throughout session for energy conservation and time management.  Therapeutic Exercise: Patient performed 2 sets of the following exercises provided as HEP with verbal and tactile cues for proper technique. Sit to Stand with Arms Crossed - 7 reps - 2 sets - 2x daily - 7x weekly  Supine Chest Press with Dumbbells - 10 reps - 2 sets - 2x daily - 7x weekly  Supine Bridge - 10 reps - 2 sets - 2x daily - 7x weekly  Single Leg Stance with Support - 3 reps - 30 sec hold - 2x daily - 7x weekly  Educated patient on starting with HEP 2x a day then progressing to adding seated work out videos  2-3x per week with HHPT approval to promote long term exercise promotion into her daily routine. Also educated patient on walking laps in her home once her R foot heeled to continue to progress with gait training for activity tolerance. Patient stated understanding and was appreciative of education.   Patient continues to require rest breaks between activities due to decreased activity tolerance. Patient tolerating RA with all activity with SPO2 >94% throughout.   Patient in w/c in room at end of session with breaks locked, and all needs within reach.   Session 2: Patient in bed upon PT arrival. Patient alert and agreeable to PT session. Patient reported 6/10 R breast and R foot pain during session, declined pain medicine. PT provided repositioning, rest breaks, and distraction as pain interventions throughout session.  Patient's sister present and participated in family education throughout session for upcoming d/c.  Reviewed set up for bed<>BSC transfers in patient's room at home for safety and supplied patient with [facility] strips to place under her BSC due to having wood floors. Educated on patient being mod I with basic transfers, but to have supervision when ambulating, doing standing activities, and car transfers for safety. Also, educated on fall risk/prevention, home modifications to prevent falls, and activation of emergency services in the event of a fall during session.   Therapeutic Activity: Bed Mobility: Patient performed supine to sit independently.  Transfers: Patient performed sit to/from stand x2 and stand pivot x2 with mod I using a RW.   Gait Training:  Patient ambulated 10 feet using RW with supervision to demonstrate safe guarding technique. Ambulated with decreased gait speed decreased step length and height, increased BOS, forward trunk lean, and mild trendelenburg gait. Provided verbal cues for increased step height for safety. Patients sister demonstrated understanding by  providing safe guarding during last 5 feet of ambulation.  Limited ambulation due to blisters on R foot, educated patient and her sister to limit ambulation at home to <25 feet until her blisters heel and that she will require supervision upon d/c with all ambulation for safety until cleared by HHPT. Patient and her sister stated understanding.   Patient ascended/descended 8 steps using B rails with CGA, 4 steps with PT for demonstrating and 4 steps with her sister providing safe guarding. Performed step-to gait pattern leading with R while ascending and L while descending. Provided cues for safe guarding technique.  She then ascended/descended 2 steps using 1 rail with B UE support to simulate walk way steps before home entry with CGA. Provided demonstration and cues for technique and sequencing.  Educated patient to always have someone with her and for her and/or her sister to teach anyone else safe guarding on the stairs. Patient and her sister in agreement.   Wheelchair Mobility:  Patient was transported in the w/c with total A throughout session for energy conservation and time management.  Therapeutic Exercise: Patient performed the following exercises with verbal and tactile cues for proper technique. Reviewed HEP with patient and her sister, patient's sister taking HEP to the patient's house tonight.  Patient sitting EOB with her sister in the room at end of session with breaks locked and all needs within reach.    Therapy Documentation Precautions:  Precautions Precautions: Fall Precaution Comments: O2 at hs, prn daytime Restrictions Weight Bearing Restrictions: No    Therapy/Group: Individual Therapy  Chenay Nesmith L Kynlei Piontek PT, DPT  09/17/2019, 4:56 PM

## 2019-09-18 LAB — COMPREHENSIVE METABOLIC PANEL
ALT: 16 U/L (ref 0–44)
AST: 22 U/L (ref 15–41)
Albumin: 2.6 g/dL — ABNORMAL LOW (ref 3.5–5.0)
Alkaline Phosphatase: 78 U/L (ref 38–126)
Anion gap: 8 (ref 5–15)
BUN: 14 mg/dL (ref 6–20)
CO2: 19 mmol/L — ABNORMAL LOW (ref 22–32)
Calcium: 8.9 mg/dL (ref 8.9–10.3)
Chloride: 113 mmol/L — ABNORMAL HIGH (ref 98–111)
Creatinine, Ser: 1.14 mg/dL — ABNORMAL HIGH (ref 0.44–1.00)
GFR calc Af Amer: >60 mL/min (ref >60)
GFR calc non Af Amer: 56 mL/min — ABNORMAL LOW (ref >60)
Glucose, Bld: 103 mg/dL — ABNORMAL HIGH (ref 70–99)
Potassium: 4 mmol/L (ref 3.5–5.1)
Sodium: 140 mmol/L (ref 135–145)
Total Bilirubin: 0.3 mg/dL (ref 0.3–1.2)
Total Protein: 5.9 g/dL — ABNORMAL LOW (ref 6.5–8.1)

## 2019-09-18 LAB — GLUCOSE, CAPILLARY: Glucose-Capillary: 106 mg/dL — ABNORMAL HIGH (ref 70–99)

## 2019-09-18 MED ORDER — CARVEDILOL 12.5 MG PO TABS: 12.5000 mg | ORAL_TABLET | Freq: Two times a day (BID) | ORAL | 0 refills | Status: DC

## 2019-09-18 MED ORDER — ZINC SULFATE 220 (50 ZN) MG PO CAPS: 220.0000 mg | ORAL_CAPSULE | Freq: Every day | ORAL | 0 refills | Status: DC

## 2019-09-18 MED ORDER — DICLOFENAC SODIUM 1 % EX GEL: 2.0000 g | Freq: Four times a day (QID) | CUTANEOUS | 0 refills | Status: DC

## 2019-09-18 MED ORDER — ACETAMINOPHEN 325 MG PO TABS: 650.0000 mg | ORAL_TABLET | ORAL | Status: DC | PRN

## 2019-09-18 MED ORDER — SENNA 8.6 MG PO TABS: 1.0000 | ORAL_TABLET | Freq: Two times a day (BID) | ORAL | 0 refills | Status: DC

## 2019-09-18 MED ORDER — ASCORBIC ACID 500 MG PO TABS: 500.0000 mg | ORAL_TABLET | Freq: Two times a day (BID) | ORAL | 0 refills | Status: DC

## 2019-09-18 MED ORDER — TRAMADOL HCL 50 MG PO TABS: ORAL_TABLET | ORAL | 0 refills | Status: DC

## 2019-09-18 MED ORDER — LIDOCAINE 5 % EX PTCH: 1.0000 | MEDICATED_PATCH | CUTANEOUS | 0 refills | Status: DC

## 2019-09-18 MED ORDER — INSULIN GLARGINE 100 UNIT/ML ~~LOC~~ SOLN: 37.0000 [IU] | Freq: Every day | SUBCUTANEOUS | 11 refills | Status: DC

## 2019-09-18 MED ORDER — HYDROCORTISONE (PERIANAL) 2.5 % EX CREA: TOPICAL_CREAM | Freq: Two times a day (BID) | CUTANEOUS | 1 refills | Status: DC

## 2019-09-18 MED ORDER — CETAPHIL MOISTURIZING EX LOTN: TOPICAL_LOTION | Freq: Two times a day (BID) | CUTANEOUS | 0 refills | Status: DC

## 2019-09-18 MED ORDER — TRAZODONE HCL 50 MG PO TABS: 50.0000 mg | ORAL_TABLET | Freq: Every day | ORAL | 0 refills | Status: DC

## 2019-09-18 MED ORDER — FLUCONAZOLE 100 MG PO TABS: 100.0000 mg | ORAL_TABLET | Freq: Every day | ORAL | 0 refills | Status: DC

## 2019-09-18 NOTE — Discharge Summary (Signed)
Physician Discharge Summary  Patient ID: [name] MRN: [id number] DOB/AGE: January 10, 1971 49 y.o.  Admit date: 08/29/2019 Discharge date: 09/18/2019  Discharge Diagnoses:  Principal Problem:   Physical debility Active Problems:   SLE   SJOGREN'S SYNDROME   HTN (hypertension)   Morbid obesity ([place])   Diabetic gastroparesis ([place])   Diabetes ([place])   Hepatic adenoma   Obesity hypoventilation syndrome ([place])   Pressure injury of skin   Discharged Condition: stable   Significant Diagnostic Studies: CT ABDOMEN PELVIS W WO CONTRAST  Result Date: 09/11/2019 CLINICAL DATA:  Inpatient. Recent hospitalization for hemorrhagic liver adenomas reportedly recently embolized at outside facility. Worsening anemia. EXAM: CT ABDOMEN AND PELVIS WITHOUT AND WITH CONTRAST TECHNIQUE: Multidetector CT imaging of the abdomen and pelvis was performed following the standard protocol before and following the bolus administration of intravenous contrast. CONTRAST:  141mL OMNIPAQUE IOHEXOL 300 MG/ML  SOLN COMPARISON:  08/12/2019 CT abdomen/pelvis. FINDINGS: Lower chest: No significant pulmonary nodules or acute consolidative airspace disease. Hepatobiliary: There are multiple irregular low-attenuation liver masses scattered throughout the liver without compelling evidence of enhancement, decreased in size in the interval. Representative 5.8 x 5.8 cm segment 6 right liver mass (series 11/image 44), previously 7.6 x 6.2 cm. Representative 10.7 x 4.5 cm central liver mass (series 11/image 26), previously 13.4 x 5.1 cm. No appreciable new liver masses. Normal gallbladder with no radiopaque cholelithiasis. No biliary ductal dilatation. Pancreas: Normal, with no mass or duct dilation. Spleen: Normal size. No mass. Adrenals/Urinary Tract: Normal adrenals. No renal stones. No hydronephrosis. Normal bladder. Stomach/Bowel: Normal non-distended stomach. Normal caliber small bowel with no small bowel wall thickening. Normal appendix.  Normal large bowel with no diverticulosis, large bowel wall thickening or pericolonic fat stranding. Vascular/Lymphatic: Normal caliber abdominal aorta. Patent portal, splenic, hepatic and renal veins. No pathologically enlarged lymph nodes in the abdomen or pelvis. Reproductive: Grossly normal uterus. There is an ill-defined 11.4 x 2.9 cm focus of soft tissue density draping along anterior uterus in the anterior pelvis (series 11/image 107), correlating to the site of hemoperitoneum on the prior scan. Other: No pneumoperitoneum. No perihepatic hemoperitoneum. No abdominal ascites. No abdominal fluid collections. No retroperitoneal hematoma. Musculoskeletal: No aggressive appearing focal osseous lesions. IMPRESSION: 1. Low-attenuation liver masses have decreased in size since 08/12/2019 CT and demonstrate no compelling evidence of enhancement, compatible with reported interval embolization of known liver adenomas. No perihepatic hemoperitoneum to suggest acute hemorrhage of liver lesions. 2. Indeterminate ill-defined 11.4 x 2.9 cm focus of soft tissue density draping along the anterior uterus in the anterior pelvis, correlating to the site of hemoperitoneum on the prior CT study. Findings may represent residual subacute hemoperitoneum in the pelvis. Strictly speaking, an adnexal mass cannot be excluded, but is less favored. Suggest attention on follow-up CT or MRI pelvis without and with IV contrast and with oral contrast in 3 months. Electronically Signed   By: Ilona Sorrel M.D.   On: 09/11/2019 08:09   US PELVIC COMPLETE WITH TRANSVAGINAL  Result Date: 09/11/2019 CLINICAL DATA:  Pelvic mass EXAM: TRANSABDOMINAL AND TRANSVAGINAL ULTRASOUND OF PELVIS TECHNIQUE: Both transabdominal and transvaginal ultrasound examinations of the pelvis were performed. Transabdominal technique was performed for global imaging of the pelvis including uterus, ovaries, adnexal regions, and pelvic cul-de-sac. It was necessary to  proceed with endovaginal exam following the transabdominal exam to visualize the uterus, endometrium, and ovaries. COMPARISON:  09/06/2013 Correlation: CT abdomen and pelvis 09/10/2019 FINDINGS: Uterus Measurements: 7.8 x 4.5 x 5.5 cm = volume:  100 mL. Inadequately visualized on transabdominal imaging due to body habitus and incomplete bladder distension. Fundus poorly visualized on transvaginal imaging due to distance from transducer and shadowing bowel. No gross evidence of uterine mass identified Endometrium Thickness: 8 mm.  No endometrial fluid Right ovary No normal appearing RIGHT ovary visualized see below Left ovary Measurements: 2.8 x 1.2 x 2.2 cm = volume: 3.7 mL. Dominant follicle without mass Other findings Small amount of nonspecific free pelvic fluid. Cyst identified in RIGHT adnexa 3.1 x 1.9 x 1.6 cm, simple features, could represent a paraovarian cyst or a cyst replacing the RIGHT ovary. Fluid collection identified adjacent to the uterine fundus on CT is not adequately demonstrated on sonography due to limited visualization of this region as above. IMPRESSION: RIGHT adnexal cyst 3.1 cm diameter question of ovarian or paraovarian origin. No other definite pelvic sonographic abnormalities identified on limited exam as above. Electronically Signed   By: Lavonia Dana M.D.   On: 09/11/2019 14:24    Labs:  Basic Metabolic Panel: Recent Labs  Lab 09/12/19 0534 09/13/19 0517 09/16/19 0619 09/18/19 0551  NA 142 140 141 140  K 3.6 3.8 3.5 4.0  CL 109 112* 110 113*  CO2 23 21* 21* 19*  GLUCOSE 90 78 102* 103*  BUN 10 13 11 14   CREATININE 1.24* 1.05* 1.14* 1.14*  CALCIUM 9.0 8.8* 9.2 8.9    CBC: CBC Latest Ref Rng & Units 09/16/2019 09/12/2019 09/11/2019  WBC 4.0 - 10.5 K/uL 9.0 7.4 8.0  Hemoglobin 12.0 - 15.0 g/dL 9.1(L) 8.2(L) 9.8(L)  Hematocrit 36.0 - 46.0 % 29.0(L) 25.0(L) 31.2(L)  Platelets 150 - 400 K/uL 320 348 420(H)    CBG: No results for input(s): GLUCAP in the last 168  hours.   Brief HPI:   [name] is a 49 y.o. female with history of T2DM with gastroparesis, HTN, super morbid obesity, Sjogren's syndrome, Lupus, OSA, hepatic adenomas who was originally admitted on 08/12/19 with 2 week history of N/V with severe abdominal pain. She was found to have AKI with metabolic acidosis with ketosis, elevated LFTs, and anemia due to multiple mass like lesions in live with hemoperitoneum. She was started on insulin drip for DKA and sodium bicarb. She was transferred to [facility] per Dr. Hilarie Fredrickson for evaluation and intervention. MRI at [facility] showed multiple non-enhancing large hepatic lesions with hemorrhage and she underwent embolization of right liver adenomas on 01/20. She was placed on Lovenox for DVT prophylaxis and transferred back to [facility] on 01/22 for management. She did develop vaginal bleeding therefore Lovenox was discontinued. Serial H/H was stable but she continued to have deficits in mobility and ADLs due to  Debility. CIR was recommended due to functional decline.    Hospital Course: [name] was admitted to rehab 08/29/2019 for inpatient therapies to consist of PT and OT at least three hours five days a week. Past admission physiatrist, therapy team and rehab RN have worked together to provide customized collaborative inpatient rehab. Blood pressures were monitored on TID basis and have been controlled.  Megestrol was resumed to prevent dysfunctional bleeding and Lovenox added for DVT prophylaxis. . Weight have been monitored daily and She did have recurrent drop in H/H requiring transfusion with one unit PRBC. CT abdomen pelvis done for follow up on liver lesions and showed lesions to be decreasing in size but questioned soft tissue density over anterior uterus.   Dr. Melba Coon was contacted for input and reported that patient had bicornuate uterus  and vaginal ultrasound/abdominal ultrasound was ordered for work up. This showed right adnexal cyst with 8 mm uterine stripe.  Bleeding not felt to be due to vaginal/gyn source and patient to follow up with Dr. Kara Mead for repeat ultrasound after discharge.    Her levothyroxine was increased to 150 mcg daily on 2/12 and recommend repeat TSH in 4-6 weeks for recheck.  Dr.Danis with [facility] was consulted for input on  intermtitent rectal bleeding as well as ongoing anemia. He checking iron studies, anusol cream to treat hems and colonscopy on outpatient basis. Iron studies showed low iron stores with low ferritin/TIBC. She received  IV Feraheme 100 mg on 2/12 and she is to continue on iron supplements after discharge. Most recent CBC showed that H/H is stable. Her diabetes has been monitored with ac/hs CBG checks and SSI was use prn for tighter BS control.  Blood sugars reasonably controlled on Lantus and she is to resume byetta  after discharge. WOC has been consulted to help manage MASD under breasts and recommended Interdry to help with wicking of moisture.  She was treated with one week course of diflucan due to concerns of yeast and help promote wound healing. She was also advised to wear bra for support and pressure relief.    Zinc sulfate and vitamin C in addition to protein supplements added to help promote wound healing.  She continued to have minimal improvement therefore Aquacel Silver was added to  was added to Interdry dressing and she was started on another round of diflucan prior to discharge.  Her endurance and respiratory status has improved. She has been weaned off oxygen during the day and has been educated on importance of compliance with CPAP use.  As pain control has improved, she has been weaned off MS contin as well as MSIR and is using tramadol on prn basis. Bowel program has been augmented to help manage constipation. She has made gains during rehab stay and is ambulating at supervision level. She will continue to receive follow up  [place] [place] [facility] [place] [facility]  after discharge   Rehab  course: During patient's stay in rehab weekly team conferences were held to monitor patient's progress, set goals and discuss barriers to discharge. At admission, patient required total assist with mobility and max assist with ADL tasks.  She  has had improvement in activity tolerance, balance, postural control as well as ability to compensate for deficits. She is able to complete ADL task with supervision. She is modified independent for transfers and is ambulating 35' with verbal cues and supervision.    Discharge disposition: 01-Home or Self Care  Diet: Carb modified diet.   Special Instructions: 1. Need to set follow up with GI at [facility] or [facility].  2. Continue to monitor BS ac/hs and follow up with PCP for further adjustment.  3. Recommend repeat TSH in 4-6 weeks.   Discharge Instructions    Ambulatory referral to Physical Medicine Rehab   Complete by: As directed      Allergies as of 09/18/2019      Reactions   Metoclopramide Other (See Comments)   Developed restless leg, akathisia type limb movements.    Codeine Itching, Rash, Other (See Comments)   Penicillins Itching   Has patient had a PCN reaction causing immediate rash, facial/tongue/throat swelling, SOB or lightheadedness with hypotension: No Has patient had a PCN reaction causing severe rash involving mucus membranes or skin necrosis: No Has patient had a  PCN reaction that required hospitalization: No Has patient had a PCN reaction occurring within the last 10 years:yes If all of the above answers are "NO", then may proceed with Cephalosporin use.   Hydrocodone Rash   Percocet [oxycodone-acetaminophen] Hives, Rash   Tolerates dilaudid      Medication List    STOP taking these medications   Azathioprine 75 MG Tabs   benzonatate 100 MG capsule Commonly known as: TESSALON   diclofenac sodium 1 % Gel Commonly known as: Voltaren Replaced by: diclofenac Sodium 1 % Gel   doxepin 50 MG capsule Commonly known as:  SINEQUAN   Ensure Max Protein Liqd   insulin NPH Human 100 UNIT/ML injection Commonly known as: HumuLIN N   Metamucil [employer] Fiber 58.6 % powder Generic drug: psyllium   morphine 15 MG 12 hr tablet Commonly known as: MS CONTIN   morphine 15 MG tablet Commonly known as: MSIR   ondansetron 4 MG tablet Commonly known as: ZOFRAN     TAKE these medications   Accu-Chek Aviva device Use as instructed   Accu-Chek Aviva Plus test strip Generic drug: glucose blood Use to check blood sugar 4 times daily. **Needs appt for refills**   Accu-Chek Softclix Lancets lancets Use to monitor glucose levels 4 times per day; E11.9   acetaminophen 325 MG tablet Commonly known as: TYLENOL Take 2 tablets (650 mg total) by mouth every 4 (four) hours as needed for mild pain or headache.   ALPRAZolam 0.5 MG tablet Commonly known as: XANAX Take 0.5 mg 2 (two) times daily as needed by mouth for anxiety or sleep.   amLODipine 5 MG tablet Commonly known as: NORVASC Take 1 tablet (5 mg total) by mouth daily.   ascorbic acid 500 MG tablet Commonly known as: VITAMIN C Take 1 tablet (500 mg total) by mouth 2 (two) times daily.   B-D UF III MINI PEN NEEDLES 31G X 5 MM Misc Generic drug: Insulin Pen Needle USE AS DIRECTED TWICE A DAY   Easy Touch Pen Needles 32G X 4 MM Misc Generic drug: Insulin Pen Needle USE TO INJECT INSULIN TWICE DAILY   BiDil 20-37.5 MG tablet Generic drug: isosorbide-hydrALAZINE TAKE TWO (2) TABLETS BY MOUTH THREE TIMES DAILY What changed: See the new instructions.   buPROPion 300 MG 24 hr tablet Commonly known as: WELLBUTRIN XL Take 300 mg by mouth daily.   Byetta 10 MCG Pen 10 MCG/0.04ML Sopn injection Generic drug: exenatide INJECT 0.04ML UNDER THE SKIN 2 TIMES DAILY WITH A MEAL What changed: See the new instructions.   carvedilol 12.5 MG tablet Commonly known as: COREG Take 1 tablet (12.5 mg total) by mouth 2 (two) times daily with a meal.   cetaphil  lotion Apply topically 2 (two) times daily.   cetirizine 10 MG tablet Commonly known as: ZYRTEC Take 10 mg by mouth daily as needed for allergies.   clotrimazole-betamethasone cream Commonly known as: LOTRISONE Apply 1 application topically 2 (two) times daily. What changed:   when to take this  reasons to take this   cyanocobalamin 1000 MCG/ML injection Commonly known as: (VITAMIN B-12) Inject into the muscle. Every other month   cycloSPORINE 0.05 % ophthalmic emulsion Commonly known as: RESTASIS Place 1 drop into both eyes 2 (two) times daily.   diclofenac Sodium 1 % Gel Commonly known as: VOLTAREN Apply 2 g topically 4 (four) times daily. Replaces: diclofenac sodium 1 % Gel   DULoxetine 60 MG capsule Commonly known as: CYMBALTA Take 120 mg  by mouth at bedtime.   etonogestrel-ethinyl estradiol 0.12-0.015 MG/24HR vaginal ring Commonly known as: [place] 1 each every 28 (twenty-eight) days vaginally. Insert vaginally and leave in place for 3 consecutive weeks, then remove for 1 week.   famotidine 20 MG tablet Commonly known as: PEPCID Take 20 mg by mouth 2 (two) times daily.   fluconazole 100 MG tablet Commonly known as: DIFLUCAN Take 1 tablet (100 mg total) by mouth daily. Start taking on: September 19, 2019   fluticasone 50 MCG/ACT nasal spray Commonly known as: FLONASE Place 2 sprays into both nostrils continuous as needed for allergies or rhinitis.   hydrocortisone 2.5 % rectal cream Commonly known as: ANUSOL-HC Place rectally 2 (two) times daily.   hydroxychloroquine 200 MG tablet Commonly known as: PLAQUENIL Take 200 mg by mouth 2 (two) times daily.   insulin glargine 100 UNIT/ML injection Commonly known as: LANTUS Inject 0.37 mLs (37 Units total) into the skin at bedtime.   Insulin Syringe-Needle U-100 31G X 5/16" 1 ML Misc Commonly known as: Easy Touch Insulin Syringe USE AS DIRECTED THREE TIMES A DAY   Levothyroxine  150 MCG  tablet Commonly known as: SYNTHROID Take 1 tablet (150 mcg total) by mouth daily at 6 (six) AM.   lidocaine 5 % Commonly known as: LIDODERM Place 1 patch onto the skin daily. Remove & Discard patch within 12 hours or as directed by MD   Lyrica 75 MG capsule Generic drug: pregabalin Take 75 mg 3 (three) times daily by mouth.   megestrol 40 MG tablet Commonly known as: MEGACE Take 40 mg by mouth daily.   mometasone-formoterol 100-5 MCG/ACT Aero Commonly known as: DULERA Inhale 2 puffs into the lungs 2 (two) times daily.   multivitamin with minerals Tabs tablet Take 1 tablet by mouth daily.   oxybutynin 10 MG 24 hr tablet Commonly known as: DITROPAN-XL Take 15 mg by mouth daily.   pravastatin 10 MG tablet Commonly known as: PRAVACHOL Take 10 mg by mouth daily.   predniSONE 1 MG tablet Commonly known as: DELTASONE Take 3 mg by mouth daily.   albuterol (2.5 MG/3ML) 0.083% nebulizer solution Commonly known as: PROVENTIL Take 2.5 mg by nebulization every 6 (six) hours as needed for wheezing.   ProAir HFA 108 (90 Base) MCG/ACT inhaler Generic drug: albuterol Inhale 2 puffs into the lungs every 6 (six) hours as needed for wheezing or shortness of breath.   REFRESH OP Place 1 drop 2 (two) times daily into both eyes.   sacubitril-valsartan 97-103 MG Commonly known as: ENTRESTO Take 1 tablet by mouth 2 (two) times daily.   senna 8.6 MG Tabs tablet Commonly known as: SENOKOT Take 1 tablet (8.6 mg total) by mouth 2 (two) times daily.   sucralfate 1 GM/10ML suspension Commonly known as: CARAFATE Take 1 g by mouth 2 (two) times daily.   topiramate 25 MG tablet Commonly known as: TOPAMAX Take 75 mg by mouth at bedtime.   traMADol 50 MG tablet Commonly known as: ULTRAM TAKE 1 TABLETS BY MOUTH EVERY 6 HOURS AS NEEDED FOR PAIN What changed: additional instructions   traZODone 50 MG tablet Commonly known as: DESYREL Take 1 tablet (50 mg total) by mouth at bedtime.    Vitamin D (Ergocalciferol) 1.25 MG (50000 UNIT) Caps capsule Commonly known as: DRISDOL Take 50,000 Units by mouth every 7 (seven) days.   zinc sulfate 220 (50 Zn) MG capsule Take 1 capsule (220 mg total) by mouth daily. Start taking on: September 19, 2019      Follow-up Information    Bartholome Bill, MD. Call.   Specialty: Family Medicine Why: for post hospital follow up Contact information: [place] [address] [phone]        [name], MD. Call.   Specialty: Physical Medicine and Rehabilitation Why: as needed Contact information: [id number] [place] [address] [phone]        Enis Gash, MD Follow up.   Specialty: Internal Medicine Why: Call for follow up on liver cyst or follow up with GI at [facility] information: [facility] [place] [address] [phone]        Janyth Contes, MD. Call on 09/19/2019.   Specialty: Obstetrics and Gynecology Why: for follow up Contact information: [place] [address] [phone]        [facility]              Follow up.   Contact information: [address] [phone]          Signed: Bary Leriche 09/26/2019, 10:03 AM

## 2019-09-18 NOTE — Discharge Instructions (Signed)
Inpatient Rehab Discharge Instructions  MARDIE PAGANO Discharge date and time:  09/08/19  Activities/Precautions/ Functional Status: Activity: no lifting, driving, or strenuous exercise till cleared by MD Diet: diabetic diet Wound Care: Keep interdry under breast and change to keep area dry. Need to wear a bra to keep breasts supported.   Functional status:  ___ No restrictions     ___ Walk up steps independently _X__ 24/7 supervision/assistance   ___ Walk up steps with assistance ___ Intermittent supervision/assistance  ___ Bathe/dress independently ___ Walk with walker     ___ Bathe/dress with assistance ___ Walk Independently    ___ Shower independently ___ Walk with assistance    _X__ Shower with assistance _X__ No alcohol     ___ Return to work/school ________  COMMUNITY REFERRALS UPON DISCHARGE:    Home Health:   PT     OT     RN                     Agency:  [facility]  Phone: [phone]    Medical Equipment/Items Ordered:  Rolling walker                                                       Agency/Supplier:  [facility] @ [phone]   Special Instructions: 1. Need to wear CPAP at nights and whenever  [place]. Use oxygen at nights with CPAP   My questions have been answered and I understand these instructions. I will adhere to these goals and the provided educational materials after my discharge from the hospital.  Patient/Caregiver Signature _______________________________ Date __________  Clinician Signature _______________________________________ Date __________  Please bring this form and your medication list with you to all your follow-up doctor's appointments.

## 2019-09-18 NOTE — Progress Notes (Signed)
Social Work Discharge Note   The overall goal for the admission was met for:   Discharge location: Yes - home with family to provide 24/7 support  Length of Stay: Yes - 20 days  Discharge activity level: Yes - supervision overall  Home/community participation: Yes  Services provided included: MD, RD, PT, OT, RN, TR, Pharmacy and SW  Financial Services: Medicaid  Follow-up services arranged: Home Health: RN, PT, OT via [facility], DME: bariatric rolling walker via [place] and Patient/Family has no preference for HH/DME agencies  Comments (or additional information):  Contact - pt @ [phone] or sister, Keiera Strathman @ [phone]  Patient/Family verbalized understanding of follow-up arrangements: Yes  Individual responsible for coordination of the follow-up plan: pt  Confirmed correct DME delivered: Lennart Pall 09/18/2019    [name]

## 2019-09-18 NOTE — Progress Notes (Signed)
[place] PHYSICAL MEDICINE & REHABILITATION PROGRESS NOTE   Subjective/Complaints:  Pt reports ready for d/c. Sister to pick her up 2-3pm after work.    ROS; pt denies SOB, CP, endorses very mild abd pain still ; denies N/V/D/C; denies vision changes;   Objective:   No results found. Recent Labs    09/16/19 0619  WBC 9.0  HGB 9.1*  HCT 29.0*  PLT 320   Recent Labs    09/16/19 0619 09/18/19 0551  NA 141 140  K 3.5 4.0  CL 110 113*  CO2 21* 19*  GLUCOSE 102* 103*  BUN 11 14  CREATININE 1.14* 1.14*  CALCIUM 9.2 8.9    Intake/Output Summary (Last 24 hours) at 09/18/2019 0958 Last data filed at 09/17/2019 2200 Gross per 24 hour  Intake 600 ml  Output --  Net 600 ml     Physical Exam: Vital Signs Blood pressure 128/63, pulse 88, temperature 98.9 F (37.2 C), temperature source Oral, resp. rate 18, height 5\' 8"  (1.727 m), weight (!) 243 kg, last menstrual period 03/24/2017, SpO2 97 %. Labs and vitals and previous day's notes reviewed   Morbidly obese female with BMI of 65;; hair on top of head; brighter, but still sad/flat appearing; quiet voice; laying supine in bariatric bed again; NAD  HENT:  Head: Normocephalic and atraumatic.  Nose: Nose normal. Off O2 Eyes:  Conjunctivae and EOM are normal.  Neck: No tracheal deviation present.  Cardiovascular: Normal rate and regular rhythm.  Respiratory:CTA B/L, however slightly decreased at bases still ; Off O2 GI: Soft. Mild TTP over RUQ- no rebound; protuberant; ND, hyperactive BS  Musculoskeletal:        Cervical back: Normal range of motion and neck supple.     Comments: B/L UEs- deltoi 4-/5, biceps 4/5, triceps 4/5, WE 4/5, grip 4/5, finger abd 4-/5 LEs- HF 2+/5, KE 2+/5, DF/PF 4-/5.   Neurological:Sensation intact in all 4 extremities to light touch Skin:  Has more superficial skin breakdown under R breast ~ 1cm in width and 6-7 cm in length- red/angry appearing and less open skin tears however; smells  strongly of yeast and yeasty appearing;  -R foot- has 2 large blisters/bullae on lateral and medial aspect of foot and a few small ones on sole of foot- hardened -not soft like was initially.  Psychiatric: brighter affect    Assessment/Plan: 1. Functional deficits secondary to debility due to liver bleeding adenoma and DKA/AKI which require 3+ hours per day of interdisciplinary therapy in a comprehensive inpatient rehab setting.  Physiatrist is providing close team supervision and 24 hour management of active medical problems listed below.  Physiatrist and rehab team continue to assess barriers to discharge/monitor patient progress toward functional and medical goals  Care Tool:  Bathing    Body parts bathed by patient: Right arm, Left arm, Chest, Abdomen, Front perineal area, Face, Right upper leg, Left upper leg, Right lower leg, Left lower leg, Buttocks   Body parts bathed by helper: Buttocks, Right lower leg, Left lower leg     Bathing assist Assist Level: Set up assist     Upper Body Dressing/Undressing Upper body dressing   What is the patient wearing?: Pull over shirt    Upper body assist Assist Level: Independent    Lower Body Dressing/Undressing Lower body dressing      What is the patient wearing?: Underwear/pull up, Pants     Lower body assist Assist for lower body dressing: Set up assist  Toileting Toileting    Toileting assist Assist for toileting: Independent with assistive device     Transfers Chair/bed transfer  Transfers assist     Chair/bed transfer assist level: Independent with assistive device Chair/bed transfer assistive device: Programmer, multimedia   Ambulation assist   Ambulation activity did not occur: Safety/medical concerns(bilateral LE weakness, fatigue, SOB)  Assist level: Supervision/Verbal cueing Assistive device: Walker-rolling Max distance: 50'   Walk 10 feet activity   Assist  Walk 10 feet activity  did not occur: Safety/medical concerns(bilateral LE weakness, fatigue, SOB)  Assist level: Supervision/Verbal cueing Assistive device: Walker-rolling   Walk 50 feet activity   Assist Walk 50 feet with 2 turns activity did not occur: Safety/medical concerns(bilateral LE weakness, fatigue, SOB)  Assist level: Supervision/Verbal cueing Assistive device: Walker-rolling    Walk 150 feet activity   Assist Walk 150 feet activity did not occur: Safety/medical concerns(decreased activity tolerance)         Walk 10 feet on uneven surface  activity   Assist Walk 10 feet on uneven surfaces activity did not occur: Safety/medical concerns(bilateral LE weakness, fatigue, SOB)   Assist level: Contact Guard/Touching assist Assistive device: Aeronautical engineer Will patient use wheelchair at discharge?: No(Patient is a functional ambulator) Type of Wheelchair: Manual Wheelchair activity did not occur: N/A         Wheelchair 50 feet with 2 turns activity    Assist    Wheelchair 50 feet with 2 turns activity did not occur: N/A       Wheelchair 150 feet activity     Assist  Wheelchair 150 feet activity did not occur: N/A       Blood pressure 128/63, pulse 88, temperature 98.9 F (37.2 C), temperature source Oral, resp. rate 18, height 5\' 8"  (1.727 m), weight (!) 243 kg, last menstrual period 03/24/2017, SpO2 97 %.  1.  Impaired mobility, ADLs and function secondary to debility due to hemrrhagic shock due to bleeding adenoma, AKI, and DKA             -patient may  shower             -ELOS/Goals: 7-10 days/ supervision to min assist  -Continue CIR PT and OT 2.  Antithrombotics: -DVT/anticoagulation:  Mechanical: Sequential compression devices, below knee Bilateral lower extremities  -would benefit from Lovenox- will see if possible since her size and immobility             -antiplatelet therapy: N/A 3. Pain Management: Need to  educated/encourage patient to work thorough pain as > 7 days post embolization. Continue MS contin with MSIR  2/6- stopped MS contin and continued MSIR and tylenol- pt still says pain better controlled   2/8: R sided chest pain worse with leaning forward. Tenderness to palpation suggestive of costochondritis. Will add lidocaine patch to right chest wall.  4. Mood: LCSW to follow for evaluation and support.              -antipsychotic agents: N/A 5. Neuropsych: This patient is capable of making decisions on her own behalf. 6. Skin/Wound Care:  Antimicrobial fabric to absorb moisture to assist in management of MASD.  Small bullae on rightfoot.  May be skin manifestation of SLE in combination with sulfonamide, on Flomax.  Will hold this and see if skin lesions improve.WIll ask RN to pain bullae with betadine and wrap with Kerlix daily  2/15- called wound care nurse back about  yeast under breasts R>>>L- also added Diflucan 200 mg x1 and 100 mg daily x 6 days- needs to keep area dry, however breasts are pendulous and heavy- makes it difficult.  2/16- was told nurses "balling up dressing" explained they aren't- pt is moving with therapy and this occurs.   -will have pt f/u with wound care nursing 3x/week at home as well as wound care clinic.  7. Fluids/Electrolytes/Nutrition: Continue Ensure max. Will add prostat for depleted protein stores.  8. Liver adenomas with bleeding s/p embolization: Continue to monitor H/H.  2/10- transfused 1 units; but has a anterior uterus 11/4 x3cm mass- ? Bleeding-   2/12- not bleeding per CT or GI 9. OSA/OHS: Continue 2L oxygen per --encourage CPAP at nights. Continue Dulera bid 10. T2DM: Hgb A1c- 8.6--> improving/sees Dr. Loanne Drilling. Continue to monitor BS ac/hs and titrate lantus as indicated--intake poor at this time. Continue Lantus with SSI  CBG (last 3)  Recent Labs    09/17/19 1634 09/17/19 2136 09/18/19 0615  GLUCAP 157* 116* 106*  controlled 2/14   11.  Acute on chronic renal failure resolved  12. Chronic combined systolic CHF: Check weights daily. Monitor for signs of overload. On Entresto, pravastatin, Bidil and coreg.    Filed Weights   09/12/19 0601 09/16/19 0453 09/17/19 0446  Weight: (!) 239 kg (!) 244 kg (!) 243 kg    2/16- pt weight when admitted was 195 kg- BMI was 65- now weight is measuring at 244 kg- I don't think she's gained that weight- I think it's documentation error- will address with nursing.  13. H/o Lupus: On Plaquenil bid with prednisone. On Lyrica for pain management.   14. H/o anxiety/depression: On Wellbutrin and cymbalta with Xanax prn. - getting over recent death of brother.    15. Hematuria?/Vaginal bleeding?: Nuvaring out PTA. Reports heavy bleeding off megestrol--?resume.   2/11- bleeding vaginally stopped 16. Constipation: 1/30: patient states last BM was Monday and she feels constipated. Will order mag citrate. Patient worried about making it to bathroom on time. Advised that bedside commode will be placed next to her prior to administration.   2/1- 3 BMs in last 36 hours- last 1 at 2pm- hopefully feeling better.  2/9- all Senokot 1 tab BID and give Miralax- LBM 2/5.   17. Denuded skin under breasts- yeast?; placed WOC consult and will follow guidelines.    2/3- will add Diflucan 100 mg daily x 6 days for yeast adding to problems with yeast underneath breasts.   2/4- wounds are healing somewhat/so far.   2/16- Wound care seen- already tried Diflucan but ordered again- since so strong yeast odor/issue. Had pt seen by wound care again- will arrange for nursing to see at home as well to follow.  18. Urinary frequency: 2/8: ordered UA/UC. UA personally removed and within normal limits. UC in process.   2/9- will try Flomax 0.4 mg QHS to see if will help with possible urinary retention.   2/15- held Flomax due to new bullae on feet- might be sulfa allergy? Or SLE issue.  19. ABLAnemia  2/9- Hb down to 7.1- just  continues to trend down- will d/w pt about transfusion.   2/10- 2.9 x11.4 cm mass- bleeding? Not sure where bleeding is coming from; has a hemoccult (+) as well, but CT of abd and pelvis is not c/w live  Adenomas bleeding still. Will call Gyn AND GI to see if recs.   2/11- GYN doesn't feel acute blood loss anemia  due to GYN reasons- will check with GI- concerned that is multifactorial, but still, trending down too fast.  2/12- GI thinks due to internal hemorrhoids and ordered Anusol BID - will see if can get at home.  hgb 8.2  2/15- Hb up to 9.1- much improved? Con't treatment 20. Hypothyroidism-  2/11- TSH 7.63- will increase Synthroid to 150 mcg  2/15- reports Sx's slightly improved 21/ Dispo  2/16- d/c tomorrow- will arrange f/u with GI at [facility], wound care f/u and H/H nursing.   2/17- d/c today- discussed f/u that's planned and possible clinic f/u.    LOS: 20 days A FACE TO FACE EVALUATION WAS PERFORMED  Naimah Yingst 09/18/2019, 9:58 AM

## 2019-09-19 ENCOUNTER — Other Ambulatory Visit: Payer: Self-pay

## 2019-09-19 MED ORDER — HYDROCORTISONE (PERIANAL) 2.5 % EX CREA: TOPICAL_CREAM | Freq: Two times a day (BID) | CUTANEOUS | 1 refills | Status: DC

## 2019-09-19 MED ORDER — TRAZODONE HCL 50 MG PO TABS: 50.0000 mg | ORAL_TABLET | Freq: Every day | ORAL | 0 refills | Status: DC

## 2019-09-19 MED ORDER — CARVEDILOL 12.5 MG PO TABS: 12.5000 mg | ORAL_TABLET | Freq: Two times a day (BID) | ORAL | 0 refills | Status: DC

## 2019-09-19 MED ORDER — INSULIN GLARGINE 100 UNIT/ML ~~LOC~~ SOLN: 37.0000 [IU] | Freq: Every day | SUBCUTANEOUS | 11 refills | Status: DC

## 2019-09-19 MED ORDER — ASCORBIC ACID 500 MG PO TABS: 500.0000 mg | ORAL_TABLET | Freq: Two times a day (BID) | ORAL | 0 refills | Status: DC

## 2019-09-19 MED ORDER — SENNA 8.6 MG PO TABS: 1.0000 | ORAL_TABLET | Freq: Two times a day (BID) | ORAL | 0 refills | Status: DC

## 2019-09-19 NOTE — Telephone Encounter (Signed)
Did not go through to pharmacy due to computer error.

## 2019-09-20 ENCOUNTER — Ambulatory Visit: Payer: Medicaid Other | Admitting: Endocrinology

## 2019-09-20 ENCOUNTER — Other Ambulatory Visit: Payer: Self-pay | Admitting: Endocrinology

## 2019-09-20 DIAGNOSIS — E1143 Type 2 diabetes mellitus with diabetic autonomic (poly)neuropathy: Secondary | ICD-10-CM

## 2019-09-20 DIAGNOSIS — Z794 Long term (current) use of insulin: Secondary | ICD-10-CM

## 2019-10-05 ENCOUNTER — Emergency Department (HOSPITAL_COMMUNITY)
Admission: EM | Admit: 2019-10-05 | Discharge: 2019-10-06 | Disposition: A | Discharge status: Home / Self Care | Payer: Medicaid Other | Attending: Emergency Medicine | Admitting: Emergency Medicine

## 2019-10-05 ENCOUNTER — Other Ambulatory Visit: Payer: Self-pay

## 2019-10-05 ENCOUNTER — Emergency Department (HOSPITAL_COMMUNITY): Payer: Medicaid Other

## 2019-10-05 DIAGNOSIS — I5032 Chronic diastolic (congestive) heart failure: Secondary | ICD-10-CM | POA: Diagnosis not present

## 2019-10-05 DIAGNOSIS — I110 Hypertensive heart disease with heart failure: Secondary | ICD-10-CM | POA: Diagnosis not present

## 2019-10-05 DIAGNOSIS — E119 Type 2 diabetes mellitus without complications: Secondary | ICD-10-CM | POA: Insufficient documentation

## 2019-10-05 DIAGNOSIS — Z7902 Long term (current) use of antithrombotics/antiplatelets: Secondary | ICD-10-CM | POA: Diagnosis not present

## 2019-10-05 DIAGNOSIS — Z794 Long term (current) use of insulin: Secondary | ICD-10-CM | POA: Diagnosis not present

## 2019-10-05 DIAGNOSIS — R0789 Other chest pain: Secondary | ICD-10-CM | POA: Diagnosis not present

## 2019-10-05 DIAGNOSIS — R109 Unspecified abdominal pain: Secondary | ICD-10-CM | POA: Insufficient documentation

## 2019-10-05 DIAGNOSIS — Z79899 Other long term (current) drug therapy: Secondary | ICD-10-CM | POA: Diagnosis not present

## 2019-10-05 DIAGNOSIS — Z8673 Personal history of transient ischemic attack (TIA), and cerebral infarction without residual deficits: Secondary | ICD-10-CM | POA: Diagnosis not present

## 2019-10-05 DIAGNOSIS — R0602 Shortness of breath: Secondary | ICD-10-CM | POA: Diagnosis present

## 2019-10-05 DIAGNOSIS — Z87891 Personal history of nicotine dependence: Secondary | ICD-10-CM | POA: Diagnosis not present

## 2019-10-05 LAB — COMPREHENSIVE METABOLIC PANEL
ALT: 32 U/L (ref 0–44)
AST: 25 U/L (ref 15–41)
Albumin: 3.1 g/dL — ABNORMAL LOW (ref 3.5–5.0)
Alkaline Phosphatase: 83 U/L (ref 38–126)
Anion gap: 11 (ref 5–15)
BUN: 9 mg/dL (ref 6–20)
CO2: 21 mmol/L — ABNORMAL LOW (ref 22–32)
Calcium: 9.3 mg/dL (ref 8.9–10.3)
Chloride: 107 mmol/L (ref 98–111)
Creatinine, Ser: 1.05 mg/dL — ABNORMAL HIGH (ref 0.44–1.00)
GFR calc Af Amer: >60 mL/min (ref >60)
GFR calc non Af Amer: >60 mL/min (ref >60)
Glucose, Bld: 311 mg/dL — ABNORMAL HIGH (ref 70–99)
Potassium: 4.2 mmol/L (ref 3.5–5.1)
Sodium: 139 mmol/L (ref 135–145)
Total Bilirubin: 0.5 mg/dL (ref 0.3–1.2)
Total Protein: 7 g/dL (ref 6.5–8.1)

## 2019-10-05 LAB — CBC WITH DIFFERENTIAL/PLATELET
Abs Immature Granulocytes: 0.03 10*3/uL (ref 0.00–0.07)
Basophils Absolute: 0 10*3/uL (ref 0.0–0.1)
Basophils Relative: 0 %
Eosinophils Absolute: 0.4 10*3/uL (ref 0.0–0.5)
Eosinophils Relative: 4 %
HCT: 33.8 % — ABNORMAL LOW (ref 36.0–46.0)
Hemoglobin: 10.3 g/dL — ABNORMAL LOW (ref 12.0–15.0)
Immature Granulocytes: 0 %
Lymphocytes Relative: 21 %
Lymphs Abs: 2.5 10*3/uL (ref 0.7–4.0)
MCH: 27.4 pg (ref 26.0–34.0)
MCHC: 30.5 g/dL (ref 30.0–36.0)
MCV: 89.9 fL (ref 80.0–100.0)
Monocytes Absolute: 0.5 10*3/uL (ref 0.1–1.0)
Monocytes Relative: 5 %
Neutro Abs: 8.1 10*3/uL — ABNORMAL HIGH (ref 1.7–7.7)
Neutrophils Relative %: 70 %
Platelets: 283 10*3/uL (ref 150–400)
RBC: 3.76 MIL/uL — ABNORMAL LOW (ref 3.87–5.11)
RDW: 16.4 % — ABNORMAL HIGH (ref 11.5–15.5)
WBC: 11.6 10*3/uL — ABNORMAL HIGH (ref 4.0–10.5)
nRBC: 0 % (ref 0.0–0.2)

## 2019-10-05 LAB — BRAIN NATRIURETIC PEPTIDE: B Natriuretic Peptide: 75.5 pg/mL (ref 0.0–100.0)

## 2019-10-05 MED ORDER — IOHEXOL 350 MG/ML SOLN
100.0000 mL | Freq: Once | INTRAVENOUS | Status: AC | PRN
  Administered 2019-10-05: 100 mL via INTRAVENOUS

## 2019-10-05 MED ORDER — FENTANYL CITRATE (PF) 100 MCG/2ML IJ SOLN
50.0000 ug | Freq: Once | INTRAMUSCULAR | Status: AC
  Administered 2019-10-05: 50 ug via INTRAVENOUS
  Filled 2019-10-05: qty 2

## 2019-10-05 NOTE — ED Triage Notes (Addendum)
Pt to ED via EMS from home c/o [facility], and chest pain that started aprox 6 hours ago. 324 mg Aspirin given by EMS. SHOB worse with exertion. Hx CHF, DM .recently d/c from hospital . Last VS. 156/90. Hr 120s, 100% 4L , CBG 341 . Healing abscess on pt upper back,. Reports currently taking abx doxy .

## 2019-10-05 NOTE — ED Notes (Signed)
Pt called out with new onset nausea and pain. requesting to speak to nurse.

## 2019-10-05 NOTE — ED Provider Notes (Signed)
[facility] EMERGENCY DEPARTMENT Provider Note   CSN: [id number] Arrival date & time: 10/05/19  1817     History Chief Complaint  Patient presents with  . Chest Pain  . Shortness of Breath    [name] is a 49 y.o. female with a past medical history of CHF, diabetes, hypertension, morbid obesity, recent admission from 08/12/2019 until 09/18/2019 between here at [facility] [place] and rehab facility for DKA, bleeding hepatic adenoma status post embolization at [facility] currently on Plavix presenting to the ED with shortness of breath, chest pain and abdominal pain.  States that symptoms have been going on for the past week.  She reports a sharp, intermittent pain in her chest and her abdomen.  Reports nausea but denies any vomiting.  Reports shortness of breath as well.  When she was discharged from the hospital last month she was taken off of her supplemental oxygen.  Denies any diarrhea, urinary symptoms.  She is concerned because this feels similar to when she presented to the ED in January.  Denies any leg swelling, injuries or falls, hemoptysis.  HPI     Past Medical History:  Diagnosis Date  . Acute renal failure (HCC)     Intractable nausea vomiting secondary to diabetic gastroparesis causing dehydration and acute renal failure /notes 04/01/2013  . Anginal pain ([place])   . Anxiety   . Bell's palsy 08/09/2010  . Bicornuate uterus   . CAP (community acquired pneumonia) 10/2011   Archie Endo 11/08/2011  . Chest pain 01/13/2014  . CHF (congestive heart failure) ([place])   . Diabetic gastroparesis associated with type 2 diabetes mellitus ([place])    this is presumed diagnoses, not confirmed by any studies.   . Eczema   . Family history of malignant neoplasm of breast   . Family history of malignant neoplasm of ovary   . GERD (gastroesophageal reflux disease)   . High cholesterol   . Hypertension   . Liver lesion 08/13/2019  . Liver mass 2014   biopsied 03/2013 at [facility],  not malignant.  is to undergo radiologic ablation of the mass in sept/October 2014.   . Lupus ([place])   . Migraines    "maybe a couple times/yr" (04/01/2013)  . Obesity   . Obstructive sleep apnea on CPAP 2011   Oxygen at nights  . SJOGREN'S SYNDROME 08/09/2010  . SLE (systemic lupus erythematosus) ([place])    Archie Endo 12/01/2010  . Stroke [facility]) 2010; 10/2012   "left side is still weak from it, never fully regained full strength; no additions from stroke 10/2012"    Patient Active Problem List   Diagnosis Date Noted  . Pressure injury of skin 08/30/2019  . Physical debility 08/29/2019  . Obesity hypoventilation syndrome ([place]) 08/23/2019  . Skin breakdown 08/23/2019  . Essential hypertension 08/23/2019  . Liver hemorrhage 08/23/2019  . Acute blood loss anemia 08/13/2019  . Hepatic adenoma   . Hemoperitoneum   . Diabetic acidosis without coma ([place])   . Multifocal pneumonia 09/23/2017  . Acute respiratory failure ([place]) 09/21/2017  . CHF exacerbation ([place]) 09/20/2017  . Costochondritis 06/06/2017  . Chest pain 06/04/2017  . Diabetes ([place]) 09/19/2015  . Radiculopathy of cervical spine   . Severe headache 03/05/2014  . Family history of malignant neoplasm of breast   . Family history of malignant neoplasm of ovary   . Intractable nausea and vomiting 11/11/2013  . Septate uterus 09/08/2013  . Diabetic gastroparesis ([place]) 04/28/2013  . Constipation due  to pain medication 04/21/2013  . Odynophagia 04/04/2013  . Liver masses 02/28/2013  . Reflux esophagitis 02/26/2013  . Morbid obesity ([place]) 10/10/2012  . Chronic diastolic CHF (congestive heart failure) ([place]) 10/09/2012  . Chronic respiratory failure with hypoxia ([place]) 10/09/2012  . Clinical polymyositis syndrome ([place]) 09/06/2012  . Mononeuritis of lower limb 01/12/2012  . HTN (hypertension) 11/08/2011  . Systolic CHF, acute on chronic ([place]) 11/08/2011  . HLD (hyperlipidemia) 07/19/2011  . Bell's palsy 08/09/2010  . SLE 08/09/2010  .  [place] SYNDROME 08/09/2010    Past Surgical History:  Procedure Laterality Date  . BREAST CYST EXCISION Left 08/2005   epidermoid  . CARDIAC CATHETERIZATION  02/07/12  . ECTOPIC PREGNANCY SURGERY  1999  . ECTOPIC PREGNANCY SURGERY  1999  . ESOPHAGOGASTRODUODENOSCOPY N/A 02/26/2013   Procedure: ESOPHAGOGASTRODUODENOSCOPY (EGD);  Surgeon: Irene Shipper, MD;  Location: [facility] ENDOSCOPY;  Service: Endoscopy;  Laterality: N/A;  . ESOPHAGOGASTRODUODENOSCOPY N/A 03/02/2015   Procedure: ESOPHAGOGASTRODUODENOSCOPY (EGD);  Surgeon: Milus Banister, MD;  Location: [facility];  Service: Endoscopy;  Laterality: N/A;  . HERNIA REPAIR    . LEFT AND RIGHT HEART CATHETERIZATION WITH CORONARY ANGIOGRAM N/A 02/07/2012   Procedure: LEFT AND RIGHT HEART CATHETERIZATION WITH CORONARY ANGIOGRAM;  Surgeon: Laverda Page, MD;  Location: [facility] CATH LAB;  Service: Cardiovascular;  Laterality: N/A;  . LIVER BIOPSY  03/2013   liver mass/medical hx noted above  . MUSCLE BIOPSY     for lupus/notes 12/01/2010  . UMBILICAL HERNIA REPAIR  1980's     OB History    Gravida  2   Para  1   Term  1   Preterm      AB  1   Living  1     SAB      TAB      Ectopic  1   Multiple      Live Births  1           Family History  Problem Relation Age of Onset  . Diabetes Mother   . Asthma Mother   . Urticaria Mother   . Hypertension Mother   . Breast cancer Maternal Aunt 64       currently 74  . Stomach cancer Maternal Uncle        dx 63s; deceased  . Prostate cancer Maternal Uncle        deceased 64s  . Cancer Maternal Aunt        unk. primary  . Cancer Maternal Aunt        unk. primary  . Ovarian cancer Maternal Aunt        deceased 48s  . Hypertension Other   . Stroke Other   . Arthritis Other   . Ovarian cancer Sister 53       maternal half-sister; RAD51C positive  . Cancer Paternal Aunt        unk. primary; deceased 23s  . Prostate cancer Paternal Uncle        deceased 44s  . Breast cancer  Cousin        deceased 13s; daughter of mat uncle with stomach ca  . Breast cancer Maternal Aunt        deceased 56s  . Asthma Brother   . Asthma Brother   . Allergic rhinitis Neg Hx   . Angioedema Neg Hx     Social History   Tobacco Use  . Smoking status: Former Smoker    Packs/day: 0.50  Years: 3.00    Pack years: 1.50    Types: Cigarettes    Quit date: 06/02/1991    Years since quitting: 28.3  . Smokeless tobacco: Never Used  . Tobacco comment: quit 28 yrs ago  Substance Use Topics  . Alcohol use: No    Alcohol/week: 0.0 standard drinks  . Drug use: No    Home Medications Prior to Admission medications   Medication Sig Start Date End Date Taking? Authorizing Provider  acetaminophen (TYLENOL) 325 MG tablet Take 2 tablets (650 mg total) by mouth every 4 (four) hours as needed for mild pain or headache. 09/18/19  Yes Love, Ivan Anchors, PA-C  albuterol (PROAIR HFA) 108 (90 BASE) MCG/ACT inhaler Inhale 2 puffs into the lungs every 6 (six) hours as needed for wheezing or shortness of breath.    Yes [provider]  albuterol (PROVENTIL) (2.5 MG/3ML) 0.083% nebulizer solution Take 2.5 mg by nebulization every 6 (six) hours as needed for wheezing.   Yes [provider]  ALPRAZolam Duanne Moron) 0.5 MG tablet Take 0.5 mg 2 (two) times daily as needed by mouth for anxiety or sleep.  04/19/17  Yes [provider]  amLODipine (NORVASC) 5 MG tablet Take 1 tablet (5 mg total) by mouth daily. 10/19/18  Yes Miquel Dunn, NP  BIDIL 20-37.5 MG tablet TAKE TWO (2) TABLETS BY MOUTH THREE TIMES DAILY  Patient taking differently: Take 2 tablets by mouth 3 (three) times daily.  04/03/19  Yes Miquel Dunn, NP  buPROPion (WELLBUTRIN XL) 300 MG 24 hr tablet Take 300 mg by mouth daily. 06/28/19  Yes [provider]  BYETTA 10 MCG PEN 10 MCG/0.04ML SOPN injection INJECT 0.04ML UNDER THE SKIN 2 TIMES DAILY WITH A MEAL Patient taking differently: Inject 10 mcg  into the skin 2 (two) times daily with a meal.  08/07/19  Yes Renato Shin, MD  carvedilol (COREG) 12.5 MG tablet Take 1 tablet (12.5 mg total) by mouth 2 (two) times daily with a meal. 09/19/19  Yes Love, Ivan Anchors, PA-C  cetirizine (ZYRTEC) 10 MG tablet Take 10 mg by mouth daily as needed for allergies.    Yes [provider]  clopidogrel (PLAVIX) 75 MG tablet Take 75 mg by mouth daily. 09/12/19  Yes [provider]  cycloSPORINE (RESTASIS) 0.05 % ophthalmic emulsion Place 1 drop into both eyes in the morning, at noon, and at bedtime.    Yes [provider]  diclofenac Sodium (VOLTAREN) 1 % GEL Apply 2 g topically 4 (four) times daily. 09/18/19  Yes Love, Ivan Anchors, PA-C  etonogestrel-ethinyl estradiol (NUVARING) 0.12-0.015 MG/24HR vaginal ring Place 1 each every 28 (twenty-eight) days vaginally. Insert vaginally and leave in place for 3 consecutive weeks, then remove for 1 week.   Yes [provider]  famotidine (PEPCID) 20 MG tablet Take 20 mg by mouth 2 (two) times daily.   Yes [provider]  fluticasone (FLONASE) 50 MCG/ACT nasal spray Place 2 sprays into both nostrils continuous as needed for allergies or rhinitis.   Yes [provider]  hydroxychloroquine (PLAQUENIL) 200 MG tablet Take 200 mg by mouth 2 (two) times daily.    Yes [provider]  insulin glargine (LANTUS) 100 UNIT/ML injection Inject 0.37 mLs (37 Units total) into the skin at bedtime. 09/19/19  Yes Love, Ivan Anchors, PA-C  levothyroxine (SYNTHROID) 150 MCG tablet Take 150 mcg by mouth daily. 09/25/19  Yes [provider]  megestrol (MEGACE) 40 MG tablet Take 40  mg by mouth daily. 01/30/15  Yes [provider]  metFORMIN (GLUCOPHAGE-XR) 500 MG 24 hr tablet Take 1,000 mg by mouth 2 (two) times daily. 09/12/19  Yes [provider]  mometasone-formoterol (DULERA) 100-5 MCG/ACT AERO Inhale 2 puffs into the lungs 2 (two) times daily. 05/29/17  Yes [provider]  Multiple Vitamin (MULTIVITAMIN WITH MINERALS) TABS tablet Take 1 tablet by mouth daily. 08/30/19  Yes Sheikh, Omair Latif, DO  oxybutynin (DITROPAN-XL) 10 MG 24 hr tablet Take 20 mg by mouth daily.  08/30/17  Yes [provider]  pravastatin (PRAVACHOL) 10 MG tablet Take 10 mg by mouth daily.   Yes [provider]  predniSONE (DELTASONE) 1 MG tablet Take 3 mg by mouth daily.  04/21/17  Yes [provider]  pregabalin (LYRICA) 75 MG capsule Take 75 mg 3 (three) times daily by mouth.  01/28/15  Yes [provider]  sacubitril-valsartan (ENTRESTO) 97-103 MG Take 1 tablet by mouth 2 (two) times daily. 08/29/19  Yes Sheikh, Omair Latif, DO  sucralfate (CARAFATE) 1 GM/10ML suspension Take 1 g by mouth 2 (two) times daily. 07/08/19  Yes [provider]  topiramate (TOPAMAX) 25 MG tablet Take 75 mg by mouth at bedtime. 03/17/16  Yes [provider]  torsemide (DEMADEX) 20 MG tablet Take 20 mg by mouth 2 (two) times daily. 09/12/19  Yes [provider]  Accu-Chek Softclix Lancets lancets Use to monitor glucose levels 4 times per day; E11.9 10/19/18   Renato Shin, MD  ascorbic acid (VITAMIN C) 500 MG tablet Take 1 tablet (500 mg total) by mouth 2 (two) times daily. Patient not taking: Reported on 10/05/2019 09/19/19   Love, Ivan Anchors, PA-C  B-D UF III MINI PEN NEEDLES 31G X 5 MM MISC USE AS DIRECTED TWICE A DAY 10/31/18   Renato Shin, MD  Blood Glucose Monitoring Suppl (ACCU-CHEK AVIVA) device Use as instructed 10/25/18 10/25/19  Philemon Kingdom, MD  cetaphil (CETAPHIL) lotion Apply topically 2 (two) times daily. Patient not taking: Reported on 10/05/2019 09/18/19   Love, Ivan Anchors, PA-C  clotrimazole-betamethasone (LOTRISONE) cream Apply 1 application topically 2 (two) times daily. Patient not taking: Reported on 10/05/2019 01/01/19   Renato Shin, MD  EASY TOUCH PEN NEEDLES 32G X 4 MM MISC USE TO INJECT INSULIN TWICE DAILY 08/07/19   Renato Shin, MD  fluconazole (DIFLUCAN) 100 MG tablet Take 1 tablet (100 mg total) by mouth daily. Patient not taking: Reported on 10/05/2019 09/19/19   Love, Ivan Anchors, PA-C  glucose blood (ACCU-CHEK AVIVA PLUS) test strip Use to check blood sugar 4 times daily. 09/20/19   Elayne Snare, MD  hydrocortisone (ANUSOL-HC) 2.5 % rectal cream Place rectally 2 (two) times daily. Patient not taking: Reported on 10/05/2019 09/19/19   Love, Ivan Anchors, PA-C  Insulin Syringe-Needle U-100 (EASY TOUCH INSULIN SYRINGE) 31G X 5/16" 1 ML MISC USE AS DIRECTED THREE TIMES A DAY 10/12/18   Renato Shin, MD  levothyroxine (SYNTHROID) 50 MCG tablet Take 1 tablet (50 mcg total) by mouth daily at 6 (six) AM. Patient not taking: Reported on 10/05/2019 08/30/19   Raiford Noble Latif, DO  lidocaine (LIDODERM) 5 % Place 1 patch onto the skin daily. Remove & Discard patch within 12 hours or as directed by MD Patient not taking: Reported on 10/05/2019 09/18/19   Love, Ivan Anchors, PA-C  senna (SENOKOT) 8.6 MG TABS tablet Take 1 tablet (8.6 mg total) by mouth 2 (two) times daily. Patient not taking: Reported on  10/05/2019 09/19/19   Love, Ivan Anchors, PA-C  traMADol (ULTRAM) 50 MG tablet TAKE 1 TABLETS BY MOUTH EVERY 6 HOURS AS NEEDED FOR PAIN Patient not taking: Reported on 10/05/2019 09/18/19   Love, Ivan Anchors, PA-C  traZODone (DESYREL) 50 MG tablet Take 1 tablet (50 mg total) by mouth at bedtime. Patient not taking: Reported on 10/05/2019 09/19/19   Love, Ivan Anchors, PA-C  zinc sulfate 220 (50 Zn) MG capsule Take 1 capsule (220 mg total) by mouth daily. Patient not taking: Reported on 10/05/2019 09/19/19   Bary Leriche, PA-C    Allergies    Metoclopramide, Codeine, Penicillins, Hydrocodone, and Percocet [oxycodone-acetaminophen]  Review of Systems   Review of Systems  Constitutional: Negative for appetite change, chills and fever.  HENT: Negative for ear pain, rhinorrhea, sneezing and sore throat.   Eyes: Negative for photophobia and visual disturbance.    Respiratory: Positive for shortness of breath. Negative for cough, chest tightness and wheezing.   Cardiovascular: Positive for chest pain. Negative for palpitations.  Gastrointestinal: Positive for abdominal pain and nausea. Negative for blood in stool, constipation, diarrhea and vomiting.  Genitourinary: Negative for dysuria, hematuria and urgency.  Musculoskeletal: Negative for myalgias.  Skin: Negative for rash.  Neurological: Negative for dizziness, weakness and light-headedness.    Physical Exam Updated Vital Signs BP (!) 133/93   Pulse (!) 109   Temp 98.6 F (37 C) (Oral)   Resp 20   Ht 5' 8"  (1.727 m)   Wt (!) 168.7 kg   LMP 03/24/2017   SpO2 99%   BMI 56.56 kg/m   Physical Exam Vitals and nursing note reviewed.  Constitutional:      General: She is not in acute distress.    Appearance: She is well-developed. She is obese.  HENT:     Head: Normocephalic and atraumatic.     Nose: Nose normal.  Eyes:     General: No scleral icterus.       Left eye: No discharge.     Conjunctiva/sclera: Conjunctivae normal.  Cardiovascular:     Rate and Rhythm: Regular rhythm. Tachycardia present.     Heart sounds: Normal heart sounds. No murmur. No friction rub. No gallop.   Pulmonary:     Effort: Pulmonary effort is normal. No respiratory distress.     Breath sounds: Normal breath sounds.  Chest:     Chest wall: Tenderness present.    Abdominal:     General: Bowel sounds are normal. There is no distension.     Palpations: Abdomen is soft.     Tenderness: There is no abdominal tenderness. There is no guarding.  Musculoskeletal:        General: Normal range of motion.     Cervical back: Normal range of motion and neck supple.     Right lower leg: No edema.     Left lower leg: No edema.  Skin:    General: Skin is warm and dry.     Findings: No rash.  Neurological:     Mental Status: She is alert.     Motor: No abnormal muscle tone.     Coordination: Coordination  normal.     ED Results / Procedures / Treatments   Labs (all labs ordered are listed, but only abnormal results are displayed) Labs Reviewed  COMPREHENSIVE METABOLIC PANEL - Abnormal; Notable for the following components:      Result Value   CO2 21 (*)    Glucose, Bld 311 (*)  Creatinine, Ser 1.05 (*)    Albumin 3.1 (*)    All other components within normal limits  CBC WITH DIFFERENTIAL/PLATELET - Abnormal; Notable for the following components:   WBC 11.6 (*)    RBC 3.76 (*)    Hemoglobin 10.3 (*)    HCT 33.8 (*)    RDW 16.4 (*)    Neutro Abs 8.1 (*)    All other components within normal limits  BRAIN NATRIURETIC PEPTIDE    EKG EKG Interpretation  Date/Time:  Saturday October 05 2019 19:13:05 EST Ventricular Rate:  105 PR Interval:    QRS Duration: 86 QT Interval:  392 QTC Calculation: 519 R Axis:   -20 Text Interpretation: Sinus tachycardia Borderline left axis deviation Low voltage, precordial leads Anteroseptal infarct, old Prolonged QT interval No acute changes No significant change since last tracing Confirmed by Varney Biles [phone]) on 10/05/2019 8:12:17 PM   Radiology CT Angio Chest PE W/Cm &/Or Wo Cm  Result Date: 10/05/2019 CLINICAL DATA:  Shortness of breath. Concern for PE. Generalized abdominal pain with nausea and vomiting. EXAM: CT ANGIOGRAPHY CHEST CT ABDOMEN AND PELVIS WITH CONTRAST TECHNIQUE: Multidetector CT imaging of the chest was performed using the standard protocol during bolus administration of intravenous contrast. Multiplanar CT image reconstructions and MIPs were obtained to evaluate the vascular anatomy. Multidetector CT imaging of the abdomen and pelvis was performed using the standard protocol during bolus administration of intravenous contrast. CONTRAST:  164m OMNIPAQUE IOHEXOL 350 MG/ML SOLN COMPARISON:  09/10/2019 FINDINGS: CTA CHEST FINDINGS Cardiovascular: Evaluation for pulmonary emboli is severely limited by respiratory motion artifact  and patient body habitus. Given these limitations, no significant pulmonary embolism was detected. The main pulmonary artery is mildly dilated measuring up to approximately 3.4 cm. The heart size is enlarged. There is no significant pericardial effusion. No evidence for a thoracic aortic aneurysm or dissection. Mediastinum/Nodes: --No mediastinal or hilar lymphadenopathy. --No axillary lymphadenopathy. --No supraclavicular lymphadenopathy. --Normal thyroid gland. --The esophagus is unremarkable Lungs/Pleura: No pulmonary nodules or masses. No pleural effusion or pneumothorax. No focal airspace consolidation. No focal pleural abnormality. Musculoskeletal: No chest wall abnormality. No acute or significant osseous findings. Review of the MIP images confirms the above findings. CT ABDOMEN and PELVIS FINDINGS Hepatobiliary: Multiple hypoattenuating liver lesions are noted. These have decreased in size since the prior study. Normal gallbladder.A small amount of perihepatic free fluid is noted. Pancreas: Normal contours without ductal dilatation. No peripancreatic fluid collection. Spleen: No splenic laceration or hematoma. Adrenals/Urinary Tract: --Adrenal glands: No adrenal hemorrhage. --Right kidney/ureter: No hydronephrosis or perinephric hematoma. --Left kidney/ureter: No hydronephrosis or perinephric hematoma. --Urinary bladder: Unremarkable. Stomach/Bowel: --Stomach/Duodenum: No hiatal hernia or other gastric abnormality. Normal duodenal course and caliber. --Small bowel: No dilatation or inflammation. --Colon: No focal abnormality. --Appendix: Normal. Vascular/Lymphatic: Normal course and caliber of the major abdominal vessels. --No retroperitoneal lymphadenopathy. --No mesenteric lymphadenopathy. --No pelvic or inguinal lymphadenopathy. Reproductive: Unremarkable Other: There is a decreasing volume of hemorrhage within the pelvis now measuring approximately 2.7 cm (previously measuring up to approximately 11 cm).  The abdominal wall is normal. Musculoskeletal. No acute displaced fractures. Review of the MIP images confirms the above findings. IMPRESSION: 1. Nearly nondiagnostic study for the detection of pulmonary emboli as detailed above. Given the significant limitations, no acute pulmonary embolism was detected. 2. No acute cardiopulmonary process. 3. Again noted are multiple liver masses with interval decrease in size since the prior study. 4. Significant interval decrease in size of hemoperitoneum draped over the uterus as  detailed above. 5. Cardiomegaly. Electronically Signed   By: Constance Holster M.D.   On: 10/05/2019 22:41   CT Abdomen Pelvis W Contrast  Result Date: 10/05/2019 CLINICAL DATA:  Shortness of breath. Concern for PE. Generalized abdominal pain with nausea and vomiting. EXAM: CT ANGIOGRAPHY CHEST CT ABDOMEN AND PELVIS WITH CONTRAST TECHNIQUE: Multidetector CT imaging of the chest was performed using the standard protocol during bolus administration of intravenous contrast. Multiplanar CT image reconstructions and MIPs were obtained to evaluate the vascular anatomy. Multidetector CT imaging of the abdomen and pelvis was performed using the standard protocol during bolus administration of intravenous contrast. CONTRAST:  149m OMNIPAQUE IOHEXOL 350 MG/ML SOLN COMPARISON:  09/10/2019 FINDINGS: CTA CHEST FINDINGS Cardiovascular: Evaluation for pulmonary emboli is severely limited by respiratory motion artifact and patient body habitus. Given these limitations, no significant pulmonary embolism was detected. The main pulmonary artery is mildly dilated measuring up to approximately 3.4 cm. The heart size is enlarged. There is no significant pericardial effusion. No evidence for a thoracic aortic aneurysm or dissection. Mediastinum/Nodes: --No mediastinal or hilar lymphadenopathy. --No axillary lymphadenopathy. --No supraclavicular lymphadenopathy. --Normal thyroid gland. --The esophagus is unremarkable  Lungs/Pleura: No pulmonary nodules or masses. No pleural effusion or pneumothorax. No focal airspace consolidation. No focal pleural abnormality. Musculoskeletal: No chest wall abnormality. No acute or significant osseous findings. Review of the MIP images confirms the above findings. CT ABDOMEN and PELVIS FINDINGS Hepatobiliary: Multiple hypoattenuating liver lesions are noted. These have decreased in size since the prior study. Normal gallbladder.A small amount of perihepatic free fluid is noted. Pancreas: Normal contours without ductal dilatation. No peripancreatic fluid collection. Spleen: No splenic laceration or hematoma. Adrenals/Urinary Tract: --Adrenal glands: No adrenal hemorrhage. --Right kidney/ureter: No hydronephrosis or perinephric hematoma. --Left kidney/ureter: No hydronephrosis or perinephric hematoma. --Urinary bladder: Unremarkable. Stomach/Bowel: --Stomach/Duodenum: No hiatal hernia or other gastric abnormality. Normal duodenal course and caliber. --Small bowel: No dilatation or inflammation. --Colon: No focal abnormality. --Appendix: Normal. Vascular/Lymphatic: Normal course and caliber of the major abdominal vessels. --No retroperitoneal lymphadenopathy. --No mesenteric lymphadenopathy. --No pelvic or inguinal lymphadenopathy. Reproductive: Unremarkable Other: There is a decreasing volume of hemorrhage within the pelvis now measuring approximately 2.7 cm (previously measuring up to approximately 11 cm). The abdominal wall is normal. Musculoskeletal. No acute displaced fractures. Review of the MIP images confirms the above findings. IMPRESSION: 1. Nearly nondiagnostic study for the detection of pulmonary emboli as detailed above. Given the significant limitations, no acute pulmonary embolism was detected. 2. No acute cardiopulmonary process. 3. Again noted are multiple liver masses with interval decrease in size since the prior study. 4. Significant interval decrease in size of hemoperitoneum  draped over the uterus as detailed above. 5. Cardiomegaly. Electronically Signed   By: CConstance HolsterM.D.   On: 10/05/2019 22:41   DG Chest Portable 1 View  Result Date: 10/05/2019 CLINICAL DATA:  Triage notes; [facility] ED [facility] chest pain that started aprox 6 hours ago. EXAM: PORTABLE CHEST 1 VIEW COMPARISON:  Chest radiograph 08/14/2019 FINDINGS: Stable cardiomediastinal contours with marked cardiomegaly. There is central vascular congestion without overt edema. No new focal consolidation. No pneumothorax or significant pleural effusion. No acute finding in the visualized skeleton. IMPRESSION: Cardiomegaly with vascular congestion.  No overt edema. Electronically Signed   By: NAudie PintoM.D.   On: 10/05/2019 19:46    Procedures Procedures (including critical care time)  Medications Ordered in ED Medications  fentaNYL (SUBLIMAZE) injection 50 mcg (50 mcg  Intravenous Given 10/05/19 2045)  iohexol (OMNIPAQUE) 350 MG/ML injection 100 mL (100 mLs Intravenous Contrast Given 10/05/19 2206)    ED Course  I have reviewed the triage vital signs and the nursing notes.  Pertinent labs & imaging results that were available during my care of the patient were reviewed by me and considered in my medical decision making (see chart for details).  Clinical Course as of Oct 04 2313  Sat Oct 05, 2019  1942 On RA.  SpO2: 100 % [HK]  2244 No definitive PE noted.  CT Angio Chest PE W/Cm &/Or Wo Cm [HK]    Clinical Course User Index [HK] Delia Heady, PA-C   MDM Rules/Calculators/A&P                      49 year old female with a past medical history of CHF, diabetes, hypertension, morbid obesity, recent admission from 08/12/2019 until 09/18/2019 [facility] status post embolization of bleeding hepatic adenoma and subsequently sent to rehab facility presenting to the ED for chest pain, abdominal pain and shortness of breath.  Reports nausea but denies any vomiting.   Has been taking her medications at home as prescribed.  She has no longer on supplemental oxygen.  On exam patient is tachycardic, oxygen saturations 99 200% on room air.  She is afebrile with no recent use of antipyretics.  No lower extremity edema, erythema or calf tenderness noted concerning for DVT.  EKG shows sinus tachycardia, prolonged QT no changes from prior tracings.  Chest x-ray shows cardiomegaly without edema.  CMP unremarkable.  CBC with mild leukocytosis of 11.6.  BNP is normal.  CT a of the chest with no concerning findings for PE.  CT of the abdomen pelvis decrease in chronic liver findings and hemoperitoneum.  I doubt ACS as a cause of her symptoms as her pain is reproducible with palpation and her EKG does not have any acute findings.  Patient is comfortable with discharge home with continued medications and PCP follow-up.  Patient is hemodynamically stable, in NAD, and able to ambulate in the ED. Evaluation does not show pathology that would require ongoing emergent intervention or inpatient treatment. I have personally reviewed and interpreted all lab work and imaging at today's ED visit. I explained the diagnosis to the patient. Pain has been managed and has no complaints prior to discharge. Patient is comfortable with above plan and is stable for discharge at this time. All questions were answered prior to disposition. Strict return precautions for returning to the ED were discussed. Encouraged follow up with PCP.   An After Visit Summary was printed and given to the patient.   Portions of this note were generated with Lobby[employer]. Dictation errors may occur despite best attempts at proofreading.  Final Clinical Impression(s) / ED Diagnoses Final diagnoses:  Chest wall pain    Rx / DC Orders ED Discharge Orders    None       Delia Heady, PA-C 10/05/19 2316    Varney Biles, MD 10/06/19 [phone]

## 2019-10-05 NOTE — Discharge Instructions (Addendum)
Continue your home medications as previously prescribed. Return to the ED if you start to develop worsening chest pain, shortness of breath, leg swelling, injuries or falls, numbness in arms or legs.

## 2019-10-05 NOTE — ED Notes (Signed)
Notified PAKahtri of pt request for pain/nausea meds.

## 2019-10-07 ENCOUNTER — Encounter (HOSPITAL_BASED_OUTPATIENT_CLINIC_OR_DEPARTMENT_OTHER): Payer: Medicaid Other | Admitting: Internal Medicine

## 2019-10-14 ENCOUNTER — Encounter (HOSPITAL_BASED_OUTPATIENT_CLINIC_OR_DEPARTMENT_OTHER): Payer: Medicaid Other | Admitting: Internal Medicine

## 2019-10-23 ENCOUNTER — Emergency Department (HOSPITAL_COMMUNITY): Payer: Medicaid Other

## 2019-10-23 ENCOUNTER — Other Ambulatory Visit: Payer: Self-pay

## 2019-10-23 ENCOUNTER — Emergency Department (HOSPITAL_COMMUNITY)
Admission: EM | Admit: 2019-10-23 | Discharge: 2019-10-23 | Disposition: A | Discharge status: Home / Self Care | Payer: Medicaid Other | Attending: Emergency Medicine | Admitting: Emergency Medicine

## 2019-10-23 ENCOUNTER — Encounter (HOSPITAL_BASED_OUTPATIENT_CLINIC_OR_DEPARTMENT_OTHER): Payer: Medicaid Other | Admitting: Physician Assistant

## 2019-10-23 ENCOUNTER — Encounter (HOSPITAL_COMMUNITY): Payer: Self-pay | Admitting: Emergency Medicine

## 2019-10-23 DIAGNOSIS — R1110 Vomiting, unspecified: Secondary | ICD-10-CM | POA: Diagnosis present

## 2019-10-23 DIAGNOSIS — I509 Heart failure, unspecified: Secondary | ICD-10-CM | POA: Insufficient documentation

## 2019-10-23 DIAGNOSIS — Z20822 Contact with and (suspected) exposure to covid-19: Secondary | ICD-10-CM | POA: Diagnosis not present

## 2019-10-23 DIAGNOSIS — E1143 Type 2 diabetes mellitus with diabetic autonomic (poly)neuropathy: Secondary | ICD-10-CM | POA: Diagnosis not present

## 2019-10-23 DIAGNOSIS — R160 Hepatomegaly, not elsewhere classified: Secondary | ICD-10-CM

## 2019-10-23 DIAGNOSIS — M3210 Systemic lupus erythematosus, organ or system involvement unspecified: Secondary | ICD-10-CM | POA: Diagnosis not present

## 2019-10-23 DIAGNOSIS — R1013 Epigastric pain: Secondary | ICD-10-CM

## 2019-10-23 DIAGNOSIS — I639 Cerebral infarction, unspecified: Secondary | ICD-10-CM | POA: Insufficient documentation

## 2019-10-23 DIAGNOSIS — Z87891 Personal history of nicotine dependence: Secondary | ICD-10-CM | POA: Insufficient documentation

## 2019-10-23 DIAGNOSIS — Z79899 Other long term (current) drug therapy: Secondary | ICD-10-CM | POA: Insufficient documentation

## 2019-10-23 DIAGNOSIS — M3500 Sicca syndrome, unspecified: Secondary | ICD-10-CM | POA: Diagnosis not present

## 2019-10-23 DIAGNOSIS — R112 Nausea with vomiting, unspecified: Secondary | ICD-10-CM | POA: Insufficient documentation

## 2019-10-23 LAB — SARS CORONAVIRUS 2 (TAT 6-24 HRS): SARS Coronavirus 2: NEGATIVE

## 2019-10-23 LAB — CBC
HCT: 35.1 % — ABNORMAL LOW (ref 36.0–46.0)
Hemoglobin: 10.8 g/dL — ABNORMAL LOW (ref 12.0–15.0)
MCH: 27.7 pg (ref 26.0–34.0)
MCHC: 30.8 g/dL (ref 30.0–36.0)
MCV: 90 fL (ref 80.0–100.0)
Platelets: 229 10*3/uL (ref 150–400)
RBC: 3.9 MIL/uL (ref 3.87–5.11)
RDW: 16.6 % — ABNORMAL HIGH (ref 11.5–15.5)
WBC: 12.6 10*3/uL — ABNORMAL HIGH (ref 4.0–10.5)
nRBC: 0 % (ref 0.0–0.2)

## 2019-10-23 LAB — BRAIN NATRIURETIC PEPTIDE: B Natriuretic Peptide: 69.2 pg/mL (ref 0.0–100.0)

## 2019-10-23 LAB — COMPREHENSIVE METABOLIC PANEL
ALT: 17 U/L (ref 0–44)
AST: 18 U/L (ref 15–41)
Albumin: 3.1 g/dL — ABNORMAL LOW (ref 3.5–5.0)
Alkaline Phosphatase: 78 U/L (ref 38–126)
Anion gap: 13 (ref 5–15)
BUN: 7 mg/dL (ref 6–20)
CO2: 21 mmol/L — ABNORMAL LOW (ref 22–32)
Calcium: 9.3 mg/dL (ref 8.9–10.3)
Chloride: 104 mmol/L (ref 98–111)
Creatinine, Ser: 1.03 mg/dL — ABNORMAL HIGH (ref 0.44–1.00)
GFR calc Af Amer: >60 mL/min (ref >60)
GFR calc non Af Amer: >60 mL/min (ref >60)
Glucose, Bld: 278 mg/dL — ABNORMAL HIGH (ref 70–99)
Potassium: 3.1 mmol/L — ABNORMAL LOW (ref 3.5–5.1)
Sodium: 138 mmol/L (ref 135–145)
Total Bilirubin: 0.7 mg/dL (ref 0.3–1.2)
Total Protein: 7.1 g/dL (ref 6.5–8.1)

## 2019-10-23 LAB — I-STAT BETA HCG BLOOD, ED (MC, WL, AP ONLY): I-stat hCG, quantitative: <5 m[IU]/mL (ref <5)

## 2019-10-23 LAB — POC SARS CORONAVIRUS 2 AG -  ED: SARS Coronavirus 2 Ag: NEGATIVE

## 2019-10-23 LAB — TROPONIN I (HIGH SENSITIVITY)
Troponin I (High Sensitivity): 6 ng/L (ref <18)
Troponin I (High Sensitivity): 7 ng/L (ref <18)

## 2019-10-23 LAB — LIPASE, BLOOD: Lipase: 27 U/L (ref 11–51)

## 2019-10-23 MED ORDER — SODIUM CHLORIDE 0.9 % IV BOLUS
500.0000 mL | Freq: Once | INTRAVENOUS | Status: AC
  Administered 2019-10-23: 500 mL via INTRAVENOUS

## 2019-10-23 MED ORDER — IOHEXOL 350 MG/ML SOLN
100.0000 mL | Freq: Once | INTRAVENOUS | Status: AC | PRN
  Administered 2019-10-23: 100 mL via INTRAVENOUS

## 2019-10-23 MED ORDER — PROMETHAZINE HCL 25 MG/ML IJ SOLN
25.0000 mg | Freq: Once | INTRAMUSCULAR | Status: AC
  Administered 2019-10-23: 25 mg via INTRAVENOUS
  Filled 2019-10-23: qty 1

## 2019-10-23 MED ORDER — FENTANYL CITRATE (PF) 100 MCG/2ML IJ SOLN
50.0000 ug | Freq: Once | INTRAMUSCULAR | Status: AC
  Administered 2019-10-23: 50 ug via INTRAVENOUS
  Filled 2019-10-23: qty 2

## 2019-10-23 MED ORDER — PROCHLORPERAZINE EDISYLATE 10 MG/2ML IJ SOLN
10.0000 mg | Freq: Once | INTRAMUSCULAR | Status: AC
Start: 2019-10-23 — End: 2019-10-23
  Administered 2019-10-23: 10 mg via INTRAVENOUS
  Filled 2019-10-23: qty 2

## 2019-10-23 MED ORDER — HYDROMORPHONE HCL 1 MG/ML IJ SOLN
1.0000 mg | Freq: Once | INTRAMUSCULAR | Status: AC
  Administered 2019-10-23: 1 mg via INTRAVENOUS
  Filled 2019-10-23: qty 1

## 2019-10-23 MED ORDER — SODIUM CHLORIDE 0.9% FLUSH: 3.0000 mL | Freq: Once | INTRAVENOUS | Status: DC

## 2019-10-23 MED ORDER — PROMETHAZINE HCL 25 MG RE SUPP: 25.0000 mg | Freq: Four times a day (QID) | RECTAL | 0 refills | Status: DC | PRN

## 2019-10-23 MED ORDER — SODIUM CHLORIDE 0.9 % IV BOLUS
1000.0000 mL | Freq: Once | INTRAVENOUS | Status: AC
  Administered 2019-10-23: 1000 mL via INTRAVENOUS

## 2019-10-23 MED ORDER — DIPHENHYDRAMINE HCL 50 MG/ML IJ SOLN
50.0000 mg | Freq: Once | INTRAMUSCULAR | Status: AC
  Administered 2019-10-23: 50 mg via INTRAVENOUS
  Filled 2019-10-23: qty 1

## 2019-10-23 NOTE — ED Triage Notes (Signed)
Patient reports persistent emesis for several days with low abdomina/low back pain for several days unrelieved by prescription antiemetics. Denies fever or chills .

## 2019-10-23 NOTE — Discharge Instructions (Signed)
Continue zofran and phenergan. You may use phenergan suppositories as well   Stay hydrated   See GI for follow up   Return to ER if you have worse abdominal pain, vomiting, fever

## 2019-10-23 NOTE — ED Notes (Signed)
Pt. Was able to take and keep down fluid, is currently eating [place] sandwich.  She stated that everything is going down fine.

## 2019-10-23 NOTE — ED Notes (Signed)
Pharmacist Suezanne Jacquet, Dr. Darl Householder, and PA Shawn all aware of pts IV infiltration.

## 2019-10-23 NOTE — ED Provider Notes (Signed)
[name]: [id number] Arrival date & time: 10/23/19  0556     History Chief Complaint  Patient presents with  . Emesis    [name] is a 49 y.o. female history of CHF, diabetes, renal failure, diabetic gastroparesis, liver mass status post embolectomy here presenting with abdominal pain and vomiting.  Patient states that symptoms have been going on for the last 3 weeks.  She states that she cannot keep anything down despite taking Zofran and Phenergan.  She was admitted in late January and eventually transferred to [facility] for embolectomy of her liver mass that was bleeding.  Denies any fevers or chills.  Patient has not followed up with GI outpatient afterwards.  The history is provided by the patient.       Past Medical History:  Diagnosis Date  . Acute renal failure (HCC)     Intractable nausea vomiting secondary to diabetic gastroparesis causing dehydration and acute renal failure /notes 04/01/2013  . Anginal pain ([place])   . Anxiety   . Bell's palsy 08/09/2010  . Bicornuate uterus   . CAP (community acquired pneumonia) 10/2011   Archie Endo 11/08/2011  . Chest pain 01/13/2014  . CHF (congestive heart failure) ([place])   . Diabetic gastroparesis associated with type 2 diabetes mellitus ([place])    this is presumed diagnoses, not confirmed by any studies.   . Eczema   . Family history of malignant neoplasm of breast   . Family history of malignant neoplasm of ovary   . GERD (gastroesophageal reflux disease)   . High cholesterol   . Hypertension   . Liver lesion 08/13/2019  . Liver mass 2014   biopsied 03/2013 at [facility], not malignant.  is to undergo radiologic ablation of the mass in sept/October 2014.   . Lupus ([place])   . Migraines    "maybe a couple times/yr" (04/01/2013)  . Obesity   . Obstructive sleep apnea on CPAP 2011   Oxygen at nights  . SJOGREN'S SYNDROME 08/09/2010  . SLE (systemic lupus erythematosus) ([place])     Archie Endo 12/01/2010  . Stroke [facility]) 2010; 10/2012   "left side is still weak from it, never fully regained full strength; no additions from stroke 10/2012"    Patient Active Problem List   Diagnosis Date Noted  . Pressure injury of skin 08/30/2019  . Physical debility 08/29/2019  . Obesity hypoventilation syndrome ([place]) 08/23/2019  . Skin breakdown 08/23/2019  . Essential hypertension 08/23/2019  . Liver hemorrhage 08/23/2019  . Acute blood loss anemia 08/13/2019  . Hepatic adenoma   . Hemoperitoneum   . Diabetic acidosis without coma ([place])   . Multifocal pneumonia 09/23/2017  . Acute respiratory failure ([place]) 09/21/2017  . CHF exacerbation ([place]) 09/20/2017  . Costochondritis 06/06/2017  . Chest pain 06/04/2017  . Diabetes ([place]) 09/19/2015  . Radiculopathy of cervical spine   . Severe headache 03/05/2014  . Family history of malignant neoplasm of breast   . Family history of malignant neoplasm of ovary   . Intractable nausea and vomiting 11/11/2013  . Septate uterus 09/08/2013  . Diabetic gastroparesis ([place]) 04/28/2013  . Constipation due to pain medication 04/21/2013  . Odynophagia 04/04/2013  . Liver masses 02/28/2013  . Reflux esophagitis 02/26/2013  . Morbid obesity ([place]) 10/10/2012  . Chronic diastolic CHF (congestive heart failure) ([place]) 10/09/2012  . Chronic respiratory failure with hypoxia ([place]) 10/09/2012  . Clinical polymyositis syndrome ([place]) 09/06/2012  . Mononeuritis of  lower limb 01/12/2012  . HTN (hypertension) 11/08/2011  . Systolic CHF, acute on chronic ([place]) 11/08/2011  . HLD (hyperlipidemia) 07/19/2011  . Bell's palsy 08/09/2010  . SLE 08/09/2010  . [place] SYNDROME 08/09/2010    Past Surgical History:  Procedure Laterality Date  . BREAST CYST EXCISION Left 08/2005   epidermoid  . CARDIAC CATHETERIZATION  02/07/12  . ECTOPIC PREGNANCY SURGERY  1999  . ECTOPIC PREGNANCY SURGERY  1999  . ESOPHAGOGASTRODUODENOSCOPY N/A 02/26/2013   Procedure:  ESOPHAGOGASTRODUODENOSCOPY (EGD);  Surgeon: Irene Shipper, MD;  Location: [facility] ENDOSCOPY;  Service: Endoscopy;  Laterality: N/A;  . ESOPHAGOGASTRODUODENOSCOPY N/A 03/02/2015   Procedure: ESOPHAGOGASTRODUODENOSCOPY (EGD);  Surgeon: Milus Banister, MD;  Location: [place];  Service: Endoscopy;  Laterality: N/A;  . HERNIA REPAIR    . LEFT AND RIGHT HEART CATHETERIZATION WITH CORONARY ANGIOGRAM N/A 02/07/2012   Procedure: LEFT AND RIGHT HEART CATHETERIZATION WITH CORONARY ANGIOGRAM;  Surgeon: Laverda Page, MD;  [facility] C[facility] LAB;  Service: Cardiovascular;  Laterality: N/A;  . LIVER BIOPSY  03/2013   liver mass/medical hx noted above  . MUSCLE BIOPSY     for lupus/notes 12/01/2010  . UMBILICAL HERNIA REPAIR  1980's     OB History    Gravida  2   Para  1   Term  1   Preterm      AB  1   Living  1     SAB      TAB      Ectopic  1   Multiple      Live Births  1           Family History  Problem Relation Age of Onset  . Diabetes Mother   . Asthma Mother   . Urticaria Mother   . Hypertension Mother   . Breast cancer Maternal Aunt 70       currently 20  . Stomach cancer Maternal Uncle        dx 67s; deceased  . Prostate cancer Maternal Uncle        deceased 35s  . Cancer Maternal Aunt        unk. primary  . Cancer Maternal Aunt        unk. primary  . Ovarian cancer Maternal Aunt        deceased 61s  . Hypertension Other   . Stroke Other   . Arthritis Other   . Ovarian cancer Sister 67       maternal half-sister; RAD51C positive  . Cancer Paternal Aunt        unk. primary; deceased 80s  . Prostate cancer Paternal Uncle        deceased 54s  . Breast cancer Cousin        deceased 59s; daughter of mat uncle with stomach ca  . Breast cancer Maternal Aunt        deceased 25s  . Asthma Brother   . Asthma Brother   . Allergic rhinitis Neg Hx   . Angioedema Neg Hx     Social History   Tobacco Use  . Smoking status: Former Smoker    Packs/day: 0.50     Years: 3.00    Pack years: 1.50    Types: Cigarettes    Quit date: 06/02/1991    Years since quitting: 28.4  . Smokeless tobacco: Never Used  . Tobacco comment: quit 28 yrs ago  Substance Use Topics  . Alcohol use: No    Alcohol/week:  0.0 standard drinks  . Drug use: No    Home Medications Prior to Admission medications   Medication Sig Start Date End Date Taking? Authorizing Provider  Accu-Chek Softclix Lancets lancets Use to monitor glucose levels 4 times per day; E11.9 10/19/18   Renato Shin, MD  acetaminophen (TYLENOL) 325 MG tablet Take 2 tablets (650 mg total) by mouth every 4 (four) hours as needed for mild pain or headache. 09/18/19   Love, Ivan Anchors, PA-C  albuterol (PROAIR HFA) 108 (90 BASE) MCG/ACT inhaler Inhale 2 puffs into the lungs every 6 (six) hours as needed for wheezing or shortness of breath.     [provider]  albuterol (PROVENTIL) (2.5 MG/3ML) 0.083% nebulizer solution Take 2.5 mg by nebulization every 6 (six) hours as needed for wheezing.    [provider]  ALPRAZolam Duanne Moron) 0.5 MG tablet Take 0.5 mg 2 (two) times daily as needed by mouth for anxiety or sleep.  04/19/17   [provider]  amLODipine (NORVASC) 5 MG tablet Take 1 tablet (5 mg total) by mouth daily. 10/19/18   Miquel Dunn, NP  ascorbic acid (VITAMIN C) 500 MG tablet Take 1 tablet (500 mg total) by mouth 2 (two) times daily. Patient not taking: Reported on 10/05/2019 09/19/19   Love, Ivan Anchors, PA-C  B-D UF III MINI PEN NEEDLES 31G X 5 MM MISC USE AS DIRECTED TWICE A DAY 10/31/18   Renato Shin, MD  BIDIL 20-37.5 MG tablet TAKE TWO (2) TABLETS BY MOUTH THREE TIMES DAILY  Patient taking differently: Take 2 tablets by mouth 3 (three) times daily.  04/03/19   Miquel Dunn, NP  Blood Glucose Monitoring Suppl (ACCU-CHEK AVIVA) device Use as instructed 10/25/18 10/25/19  Philemon Kingdom, MD  buPROPion (WELLBUTRIN XL) 300 MG 24 hr tablet Take 300 mg by mouth daily.  06/28/19   [provider]  BYETTA 10 MCG PEN 10 MCG/0.04ML SOPN injection INJECT 0.04ML UNDER THE SKIN 2 TIMES DAILY WITH A MEAL Patient taking differently: Inject 10 mcg into the skin 2 (two) times daily with a meal.  08/07/19   Renato Shin, MD  carvedilol (COREG) 12.5 MG tablet Take 1 tablet (12.5 mg total) by mouth 2 (two) times daily with a meal. 09/19/19   Love, Ivan Anchors, PA-C  cetaphil (CETAPHIL) lotion Apply topically 2 (two) times daily. Patient not taking: Reported on 10/05/2019 09/18/19   Love, Ivan Anchors, PA-C  cetirizine (ZYRTEC) 10 MG tablet Take 10 mg by mouth daily as needed for allergies.     [provider]  clopidogrel (PLAVIX) 75 MG tablet Take 75 mg by mouth daily. 09/12/19   [provider]  clotrimazole-betamethasone (LOTRISONE) cream Apply 1 application topically 2 (two) times daily. Patient not taking: Reported on 10/05/2019 01/01/19   Renato Shin, MD  cycloSPORINE (RESTASIS) 0.05 % ophthalmic emulsion Place 1 drop into both eyes in the morning, at noon, and at bedtime.     [provider]  diclofenac Sodium (VOLTAREN) 1 % GEL Apply 2 g topically 4 (four) times daily. 09/18/19   Love, Ivan Anchors, PA-C  EASY TOUCH PEN NEEDLES 32G X 4 MM MISC USE TO INJECT INSULIN TWICE DAILY 08/07/19   Renato Shin, MD  etonogestrel-ethinyl estradiol (NUVARING) 0.12-0.015 MG/24HR vaginal ring Place 1 each every 28 (twenty-eight) days vaginally. Insert vaginally and leave in place for 3 consecutive weeks, then remove for 1 week.    [provider]  famotidine (PEPCID) 20 MG tablet Take 20 mg  by mouth 2 (two) times daily.    [provider]  fluconazole (DIFLUCAN) 100 MG tablet Take 1 tablet (100 mg total) by mouth daily. Patient not taking: Reported on 10/05/2019 09/19/19   Love, Ivan Anchors, PA-C  fluticasone [facility]) 50 MCG/ACT nasal spray Place 2 sprays into both nostrils continuous as needed for allergies or rhinitis.    [provider]    glucose blood (ACCU-CHEK AVIVA PLUS) test strip Use to check blood sugar 4 times daily. 09/20/19   Elayne Snare, MD  hydrocortisone (ANUSOL-HC) 2.5 % rectal cream Place rectally 2 (two) times daily. Patient not taking: Reported on 10/05/2019 09/19/19   Love, Ivan Anchors, PA-C  hydroxychloroquine (PLAQUENIL) 200 MG tablet Take 200 mg by mouth 2 (two) times daily.     [provider]  insulin glargine (LANTUS) 100 UNIT/ML injection Inject 0.37 mLs (37 Units total) into the skin at bedtime. 09/19/19   Love, Ivan Anchors, PA-C  Insulin Syringe-Needle U-100 (EASY TOUCH INSULIN SYRINGE) 31G X 5/16" 1 ML MISC USE AS DIRECTED THREE TIMES A DAY 10/12/18   Renato Shin, MD  levothyroxine (SYNTHROID) 150 MCG tablet Take 150 mcg by mouth daily. 09/25/19   [provider]  levothyroxine (SYNTHROID) 50 MCG tablet Take 1 tablet (50 mcg total) by mouth daily at 6 (six) AM. Patient not taking: Reported on 10/05/2019 08/30/19   Raiford Noble Latif, DO  lidocaine (LIDODERM) 5 % Place 1 patch onto the skin daily. Remove & Discard patch within 12 hours or as directed by MD Patient not taking: Reported on 10/05/2019 09/18/19   Love, Ivan Anchors, PA-C  megestrol (MEGACE) 40 MG tablet Take 40 mg by mouth daily. 01/30/15   [provider]  metFORMIN (GLUCOPHAGE-XR) 500 MG 24 hr tablet Take 1,000 mg by mouth 2 (two) times daily. 09/12/19   [provider]  mometasone-formoterol (DULERA) 100-5 MCG/ACT AERO Inhale 2 puffs into the lungs 2 (two) times daily. 05/29/17   [provider]  Multiple Vitamin (MULTIVITAMIN WITH MINERALS) TABS tablet Take 1 tablet by mouth daily. 08/30/19   Raiford Noble Latif, DO  oxybutynin (DITROPAN-XL) 10 MG 24 hr tablet Take 20 mg by mouth daily.  08/30/17   [provider]  pravastatin (PRAVACHOL) 10 MG tablet Take 10 mg by mouth daily.    [provider]  predniSONE (DELTASONE) 1 MG tablet Take 3 mg by mouth daily.  04/21/17   [provider]   pregabalin (LYRICA) 75 MG capsule Take 75 mg 3 (three) times daily by mouth.  01/28/15   [provider]  sacubitril-valsartan (ENTRESTO) 97-103 MG Take 1 tablet by mouth 2 (two) times daily. 08/29/19   Raiford Noble Latif, DO  senna (SENOKOT) 8.6 MG TABS tablet Take 1 tablet (8.6 mg total) by mouth 2 (two) times daily. Patient not taking: Reported on 10/05/2019 09/19/19   Love, Ivan Anchors, PA-C  sucralfate (CARAFATE) 1 GM/10ML suspension Take 1 g by mouth 2 (two) times daily. 07/08/19   [provider]  topiramate (TOPAMAX) 25 MG tablet Take 75 mg by mouth at bedtime. 03/17/16   [provider]  torsemide (DEMADEX) 20 MG tablet Take 20 mg by mouth 2 (two) times daily. 09/12/19   [provider]  traMADol (ULTRAM) 50 MG tablet TAKE 1 TABLETS BY MOUTH EVERY 6 HOURS AS NEEDED FOR PAIN Patient not taking: Reported on 10/05/2019 09/18/19   Love, Ivan Anchors, PA-C  traZODone (DESYREL) 50 MG tablet Take 1 tablet (50 mg total) by  mouth at bedtime. Patient not taking: Reported on 10/05/2019 09/19/19   Love, Ivan Anchors, PA-C  zinc sulfate 220 (50 Zn) MG capsule Take 1 capsule (220 mg total) by mouth daily. Patient not taking: Reported on 10/05/2019 09/19/19   Bary Leriche, PA-C    Allergies    Metoclopramide, Codeine, Penicillins, Hydrocodone, and Percocet [oxycodone-acetaminophen]  Review of Systems   Review of Systems  Gastrointestinal: Positive for abdominal pain and vomiting.  All other systems reviewed and are negative.   Physical Exam Updated Vital Signs BP (!) 196/97 (BP Location: Right Arm)   Pulse 73   Temp 98.6 F (37 C) (Oral)   Resp 18   Ht 5' 8"  (1.727 m)   Wt (!) 160 kg   LMP 03/24/2017   SpO2 100%   BMI 53.63 kg/m   Physical Exam Vitals and nursing note reviewed.  Constitutional:      Comments: Uncomfortable, slightly dehydrated   HENT:     Head: Normocephalic.     Nose: Nose normal.     Mouth/Throat:     Mouth: Mucous membranes are dry.  Eyes:      Extraocular Movements: Extraocular movements intact.     Pupils: Pupils are equal, round, and reactive to light.  Cardiovascular:     Rate and Rhythm: Normal rate and regular rhythm.     Pulses: Normal pulses.     Heart sounds: Normal heart sounds.  Pulmonary:     Effort: Pulmonary effort is normal.     Breath sounds: Normal breath sounds.  Abdominal:     General: Abdomen is flat.     Palpations: Abdomen is soft.     Comments: Mild epigastric tenderness   Musculoskeletal:        General: Normal range of motion.     Cervical back: Normal range of motion.  Skin:    General: Skin is warm.     Capillary Refill: Capillary refill takes less than 2 seconds.  Neurological:     General: No focal deficit present.     Mental Status: She is alert and oriented to person, place, and time.  Psychiatric:        Mood and Affect: Mood normal.        Behavior: Behavior normal.     ED Results / Procedures / Treatments   Labs (all labs ordered are listed, but only abnormal results are displayed) Labs Reviewed  COMPREHENSIVE METABOLIC PANEL - Abnormal; Notable for the following components:      Result Value   Potassium 3.1 (*)    CO2 21 (*)    Glucose, Bld 278 (*)    Creatinine, Ser 1.03 (*)    Albumin 3.1 (*)    All other components within normal limits  CBC - Abnormal; Notable for the following components:   WBC 12.6 (*)    Hemoglobin 10.8 (*)    HCT 35.1 (*)    RDW 16.6 (*)    All other components within normal limits  LIPASE, BLOOD  URINALYSIS, ROUTINE W REFLEX MICROSCOPIC  BRAIN NATRIURETIC PEPTIDE  I-STAT BETA HCG BLOOD, ED ([facility], [facility], [facility] ONLY)  POC SARS CORONAVIRUS 2 AG -  ED  TROPONIN I (HIGH SENSITIVITY)    EKG EKG Interpretation  Date/Time:  Wednesday October 23 2019 05:59:50 EDT Ventricular Rate:  108 PR Interval:  136 QRS Duration: 86 QT Interval:  298 QTC Calculation: 399 R Axis:   2 Text Interpretation: Sinus tachycardia with Premature supraventricular complexes  Possible  Left atrial enlargement Septal infarct , age undetermined Abnormal ECG No significant change since last tracing Confirmed by Wandra Arthurs ([id number]) on 10/23/2019 6:53:51 AM   Radiology DG Chest Port 1 View  Result Date: 10/23/2019 CLINICAL DATA:  Shortness of breath EXAM: PORTABLE CHEST 1 VIEW COMPARISON:  10/05/2019 FINDINGS: Chronic cardiomegaly. Normal mediastinal contours. There is no edema, consolidation, effusion, or pneumothorax. IMPRESSION: Chronic cardiomegaly.  No edema or pneumonia. Electronically Signed   By: Monte Fantasia M.D.   On: 10/23/2019 07:29    Procedures Procedures (including critical care time)  Medications Ordered in ED Medications  sodium chloride flush (NS) 0.9 % injection 3 mL (has no administration in time range)  promethazine (PHENERGAN) injection 25 mg (has no administration in time range)  sodium chloride 0.9 % bolus 500 mL (has no administration in time range)  fentaNYL (SUBLIMAZE) injection 50 mcg (has no administration in time range)    ED Course  I have reviewed the triage vital signs and the nursing notes.  Pertinent labs & imaging results that were available during my care of the patient were reviewed by me and considered in my medical decision making (see chart for details).    MDM Rules/Calculators/A&P                      [name] is a 49 y.o. female here with abdominal pain, vomiting.  Patient has history of diabetic gastroparesis.  Patient also had recent embolectomy of her liver mass with bleeding.  Patient is hypertensive in the ED.  Will get labs and CT abdomen pelvis and give pain medicine and antiemetics and reassess.  12:59 PM Labs are unremarkable.  Her anion gap is normal.  Sugar is 270.  CT abdomen pelvis showed stable masses with no hemoperitoneum.  Patient tolerated p.o. in the ED.  I will discharge patient home with Phenergan suppositories as needed.  She is already has Zofran and p.o. Phenergan at home.  We will also  refer to GI for diabetic gastroparesis.  Final Clinical Impression(s) / ED Diagnoses Final diagnoses:  None    Rx / DC Orders ED Discharge Orders    None       Drenda Freeze, MD 10/23/19 1300

## 2019-10-23 NOTE — ED Provider Notes (Signed)
   Ultrasound ED Peripheral IV (Provider)  Date/Time: 10/23/2019 8:45 AM Performed by: Lorayne Bender, PA-C Authorized by: Lorayne Bender, PA-C   Procedure details:    Indications: multiple failed IV attempts and poor IV access     Skin Prep: chlorhexidine gluconate     Location:  Left AC   Angiocath:  20 G   Bedside Ultrasound Guided: Yes     Images: archived     Patient tolerated procedure without complications: Yes     Dressing applied: Yes   Comments:     Positive flash.  Advanced and flushed without pain, swelling, or other signs of infiltration.     Lorayne Bender, PA-C 10/23/19 [id number]    Drenda Freeze, MD 10/23/19 1140

## 2019-10-23 NOTE — ED Notes (Signed)
Pt transported to CT ?

## 2019-10-30 ENCOUNTER — Telehealth: Payer: Self-pay | Admitting: Endocrinology

## 2019-10-30 NOTE — Telephone Encounter (Signed)
MEDICATION: BYETTA 10 MCG PEN 10 MCG/0.04ML SOPN injection    PHARMACY:   [id number] - [place], [place] - [address] Phone:  [phone]  Fax:  [phone]     IS THIS A 90 DAY SUPPLY : same as last time  IS PATIENT OUT OF MEDICATION: yes  IF NOT; HOW MUCH IS LEFT:   LAST APPOINTMENT DATE: 07/18/2019  NEXT APPOINTMENT DATE:@Visit  date not found  DO WE HAVE YOUR PERMISSION TO LEAVE A DETAILED MESSAGE: yes  OTHER COMMENTS:    **Let patient know to contact pharmacy at the end of the day to make sure medication is ready. **  ** Please notify patient to allow 48-72 hours to process**  **Encourage patient to contact the pharmacy for refills or they can request refills through [facility]**

## 2019-10-31 ENCOUNTER — Other Ambulatory Visit: Payer: Self-pay

## 2019-10-31 DIAGNOSIS — E1143 Type 2 diabetes mellitus with diabetic autonomic (poly)neuropathy: Secondary | ICD-10-CM

## 2019-10-31 DIAGNOSIS — Z794 Long term (current) use of insulin: Secondary | ICD-10-CM

## 2019-10-31 MED ORDER — BYETTA 10 MCG PEN 10 MCG/0.04ML ~~LOC~~ SOPN: PEN_INJECTOR | SUBCUTANEOUS | 0 refills | Status: DC

## 2019-10-31 NOTE — Telephone Encounter (Signed)
RX sent into pharmacy

## 2019-11-04 ENCOUNTER — Encounter (HOSPITAL_BASED_OUTPATIENT_CLINIC_OR_DEPARTMENT_OTHER): Payer: Medicaid Other | Admitting: Internal Medicine

## 2019-11-18 ENCOUNTER — Other Ambulatory Visit: Payer: Self-pay

## 2019-11-18 DIAGNOSIS — Z794 Long term (current) use of insulin: Secondary | ICD-10-CM

## 2019-11-18 DIAGNOSIS — E1143 Type 2 diabetes mellitus with diabetic autonomic (poly)neuropathy: Secondary | ICD-10-CM

## 2019-11-18 MED ORDER — BYETTA 10 MCG PEN 10 MCG/0.04ML ~~LOC~~ SOPN: PEN_INJECTOR | SUBCUTANEOUS | 1 refills | Status: DC

## 2019-11-27 ENCOUNTER — Other Ambulatory Visit: Payer: Self-pay | Admitting: Endocrinology

## 2019-11-27 NOTE — Telephone Encounter (Signed)
1.  Please schedule f/u appt 2.  Then please refill x 1, pending that appt.  

## 2019-11-28 ENCOUNTER — Telehealth (HOSPITAL_COMMUNITY): Payer: Self-pay | Admitting: Physical Medicine and Rehabilitation

## 2019-11-28 ENCOUNTER — Other Ambulatory Visit: Payer: Self-pay | Admitting: Gastroenterology

## 2019-11-28 ENCOUNTER — Other Ambulatory Visit (HOSPITAL_COMMUNITY): Payer: Self-pay | Admitting: Gastroenterology

## 2019-11-28 DIAGNOSIS — R110 Nausea: Secondary | ICD-10-CM

## 2019-11-28 DIAGNOSIS — K769 Liver disease, unspecified: Secondary | ICD-10-CM

## 2019-11-28 NOTE — Telephone Encounter (Signed)
Received prior authorization for Vitamin C--written at discharge on 2/17. Left message for patient to purchase this OTC.

## 2019-12-04 ENCOUNTER — Ambulatory Visit (HOSPITAL_COMMUNITY)
Admission: RE | Admit: 2019-12-04 | Discharge: 2019-12-04 | Disposition: A | Discharge status: Home / Self Care | Payer: Medicaid Other | Source: Ambulatory Visit | Attending: Gastroenterology | Admitting: Gastroenterology

## 2019-12-04 ENCOUNTER — Other Ambulatory Visit: Payer: Self-pay

## 2019-12-04 DIAGNOSIS — K769 Liver disease, unspecified: Secondary | ICD-10-CM | POA: Insufficient documentation

## 2019-12-04 DIAGNOSIS — R110 Nausea: Secondary | ICD-10-CM

## 2019-12-09 ENCOUNTER — Ambulatory Visit (HOSPITAL_COMMUNITY)
Admission: RE | Admit: 2019-12-09 | Discharge: 2019-12-09 | Disposition: A | Discharge status: Home / Self Care | Payer: Medicaid Other | Source: Ambulatory Visit | Attending: Gastroenterology | Admitting: Gastroenterology

## 2019-12-09 ENCOUNTER — Other Ambulatory Visit: Payer: Self-pay

## 2019-12-09 DIAGNOSIS — R110 Nausea: Secondary | ICD-10-CM

## 2019-12-09 MED ORDER — TECHNETIUM TC 99M SULFUR COLLOID
2.0000 | Freq: Once | INTRAVENOUS | Status: AC | PRN
  Administered 2019-12-09: 2 via INTRAVENOUS

## 2019-12-24 ENCOUNTER — Other Ambulatory Visit: Payer: Self-pay | Admitting: Endocrinology

## 2019-12-24 ENCOUNTER — Other Ambulatory Visit: Payer: Self-pay | Admitting: Cardiology

## 2019-12-24 DIAGNOSIS — Z794 Long term (current) use of insulin: Secondary | ICD-10-CM

## 2019-12-24 DIAGNOSIS — E1143 Type 2 diabetes mellitus with diabetic autonomic (poly)neuropathy: Secondary | ICD-10-CM

## 2020-01-02 ENCOUNTER — Other Ambulatory Visit: Payer: Self-pay | Admitting: Gastroenterology

## 2020-01-03 ENCOUNTER — Other Ambulatory Visit: Payer: Self-pay | Admitting: Physical Medicine and Rehabilitation

## 2020-01-04 ENCOUNTER — Other Ambulatory Visit: Payer: Self-pay | Admitting: Endocrinology

## 2020-01-04 DIAGNOSIS — E1143 Type 2 diabetes mellitus with diabetic autonomic (poly)neuropathy: Secondary | ICD-10-CM

## 2020-01-04 DIAGNOSIS — Z794 Long term (current) use of insulin: Secondary | ICD-10-CM

## 2020-01-06 ENCOUNTER — Other Ambulatory Visit: Payer: Self-pay | Admitting: Endocrinology

## 2020-01-06 DIAGNOSIS — Z794 Long term (current) use of insulin: Secondary | ICD-10-CM

## 2020-01-06 DIAGNOSIS — E1143 Type 2 diabetes mellitus with diabetic autonomic (poly)neuropathy: Secondary | ICD-10-CM

## 2020-01-21 ENCOUNTER — Other Ambulatory Visit: Payer: Self-pay

## 2020-01-21 ENCOUNTER — Ambulatory Visit: Payer: Medicaid Other | Admitting: Cardiology

## 2020-01-21 ENCOUNTER — Encounter: Payer: Self-pay | Admitting: Cardiology

## 2020-01-21 VITALS — BP 140/95 | HR 104 | Resp 15 | Ht 69.0 in | Wt 342.0 lb

## 2020-01-21 DIAGNOSIS — I10 Essential (primary) hypertension: Secondary | ICD-10-CM

## 2020-01-21 DIAGNOSIS — I5032 Chronic diastolic (congestive) heart failure: Secondary | ICD-10-CM

## 2020-01-21 DIAGNOSIS — Z6841 Body Mass Index (BMI) 40.0 and over, adult: Secondary | ICD-10-CM

## 2020-01-21 DIAGNOSIS — I428 Other cardiomyopathies: Secondary | ICD-10-CM

## 2020-01-21 MED ORDER — AMLODIPINE BESYLATE 5 MG PO TABS: 5.0000 mg | ORAL_TABLET | Freq: Every day | ORAL | 3 refills | Status: DC

## 2020-01-21 MED ORDER — CARVEDILOL 25 MG PO TABS: 25.0000 mg | ORAL_TABLET | Freq: Two times a day (BID) | ORAL | 3 refills | Status: DC

## 2020-01-21 MED ORDER — SPIRONOLACTONE 50 MG PO TABS: 50.0000 mg | ORAL_TABLET | Freq: Every day | ORAL | 3 refills | Status: DC

## 2020-01-21 MED ORDER — SACUBITRIL-VALSARTAN 97-103 MG PO TABS: 1.0000 | ORAL_TABLET | Freq: Two times a day (BID) | ORAL | 3 refills | Status: DC

## 2020-01-21 NOTE — Progress Notes (Signed)
Primary Physician:  Bartholome Bill, MD   Patient ID: [name], female    DOB: 06-Jun-1971, 49 y.o.   MRN: [id number]  Subjective:    CC: CHF follow up  HPI: [name]  is a 49 y.o. female  with Sjogren's syndrome, SLE, chronic systolic heart failure now with improved EF to 45-50% on echo in Nov 2019, morbid obesity, obesity hypoventilation syndrome, uncontrolled type 2 DM, hypertension, h/o CVA in 2014, OSA on CPAP and hepatic  adenoma s/p embolization on 08/12/19.  She is on guideline directed medical therapy for heart failure and tolerating well. She has had coronary angiogram in 2017 with normal coronary arteries.  Since being on guideline directed medical therapy for nonischemic cardiomyopathy, she has not had any acute decompensated heart failure and LVEF has improved.  She presents for 18-month office visit.  Dyspnea is remained stable, no chest pain or palpitations.  Past Medical History:  Diagnosis Date   Acute renal failure (HCC)     Intractable nausea vomiting secondary to diabetic gastroparesis causing dehydration and acute renal failure /notes 04/01/2013   Anginal pain ([place])    Anxiety    Bell's palsy 08/09/2010   Bicornuate uterus    CAP (community acquired pneumonia) 10/2011   Archie Endo 11/08/2011   Chest pain 01/13/2014   CHF (congestive heart failure) ([place])    Diabetic gastroparesis associated with type 2 diabetes mellitus ([place])    this is presumed diagnoses, not confirmed by any studies.    Eczema    Family history of malignant neoplasm of breast    Family history of malignant neoplasm of ovary    GERD (gastroesophageal reflux disease)    High cholesterol    Hypertension    Liver lesion 08/13/2019   Liver mass 2014   biopsied 03/2013 at [facility], not malignant.  is to undergo radiologic ablation of the mass in sept/October 2014.    Lupus ([place])    Migraines    "maybe a couple times/yr" (04/01/2013)   Obesity    Obstructive sleep  apnea on CPAP 2011   Oxygen at nights   SJOGREN'S SYNDROME 08/09/2010   SLE (systemic lupus erythematosus) ([place])    Archie Endo 12/01/2010   Stroke ([place]) 2010; 10/2012   "left side is still weak from it, never fully regained full strength; no additions from stroke 10/2012"    Past Surgical History:  Procedure Laterality Date   BREAST CYST EXCISION Left 08/2005   epidermoid   CARDIAC CATHETERIZATION  02/07/12   ECTOPIC PREGNANCY SURGERY  1999   ECTOPIC PREGNANCY SURGERY  1999   ESOPHAGOGASTRODUODENOSCOPY N/A 02/26/2013   Procedure: ESOPHAGOGASTRODUODENOSCOPY (EGD);  Surgeon: Irene Shipper, MD;  Location: [facility] ENDOSCOPY;  Service: Endoscopy;  Laterality: N/A;   ESOPHAGOGASTRODUODENOSCOPY N/A 03/02/2015   Procedure: ESOPHAGOGASTRODUODENOSCOPY (EGD);  Surgeon: Milus Banister, MD;  Location: [facility];  Service: Endoscopy;  Laterality: N/A;   HERNIA REPAIR     LEFT AND RIGHT HEART CATHETERIZATION WITH CORONARY ANGIOGRAM N/A 02/07/2012   Procedure: LEFT AND RIGHT HEART CATHETERIZATION WITH CORONARY ANGIOGRAM;  Surgeon: Laverda Page, MD;  Location: [facility] CATH LAB;  Service: Cardiovascular;  Laterality: N/A;   LIVER BIOPSY  03/2013   liver mass/medical hx noted above   MUSCLE BIOPSY     for lupus/notes 01/03/3328   UMBILICAL HERNIA REPAIR  1980's   Social History   Tobacco Use   Smoking status: Former Smoker    Packs/day: 0.50    Years: 3.00  Pack years: 1.50    Types: Cigarettes    Quit date: 06/02/1991    Years since quitting: 28.6   Smokeless tobacco: Never Used   Tobacco comment: quit 28 yrs ago  Substance Use Topics   Alcohol use: No    Alcohol/week: 0.0 standard drinks   Marital Status: Single   Review of Systems  Cardiovascular: Positive for dyspnea on exertion. Negative for chest pain and leg swelling.  Musculoskeletal: Positive for arthritis and back pain.  Gastrointestinal: Negative for melena.      Objective:  Blood pressure (!) 140/95, pulse (!) 104, resp.  rate 15, height 5\' 9"  (1.753 m), weight (!) 342 lb (155.1 kg), last menstrual period 03/24/2017, SpO2 97 %. Body mass index is 50.5 kg/m.    Physical exam not performed or limited due to virtual visit.    Please see exam details from prior visit is as below.   Physical Exam Constitutional:      Appearance: She is well-developed.     Comments: Morbidly obese  Neck:     Thyroid: No thyromegaly.     Vascular: No JVD.     Comments: Short neck and difficult to evaluate JVP Cardiovascular:     Rate and Rhythm: Normal rate and regular rhythm.     Pulses: Intact distal pulses.          Carotid pulses are 2+ on the right side and 2+ on the left side.      Dorsalis pedis pulses are 2+ on the right side and 2+ on the left side.       Posterior tibial pulses are 2+ on the right side and 2+ on the left side.     Heart sounds: Normal heart sounds. No murmur heard.  No gallop.      Comments: Femoral and popliteal pulse difficult to feel due to patient's body habitus.  Pulmonary:     Effort: Pulmonary effort is normal.     Breath sounds: Normal breath sounds.  Abdominal:     General: Bowel sounds are normal.     Palpations: Abdomen is soft.     Comments: Obese. Pannus present  Musculoskeletal:     Cervical back: Neck supple.    Radiology: No results found.  Laboratory examination:    CMP Latest Ref Rng & Units 10/23/2019 10/05/2019 09/18/2019  Glucose 70 - 99 mg/dL 278(H) 311(H) 103(H)  BUN 6 - 20 mg/dL 7 9 14   Creatinine 0.44 - 1.00 mg/dL 1.03(H) 1.05(H) 1.14(H)  Sodium 135 - 145 mmol/L 138 139 140  Potassium 3.5 - 5.1 mmol/L 3.1(L) 4.2 4.0  Chloride 98 - 111 mmol/L 104 107 113(H)  CO2 22 - 32 mmol/L 21(L) 21(L) 19(L)  Calcium 8.9 - 10.3 mg/dL 9.3 9.3 8.9  Total Protein 6.5 - 8.1 g/dL 7.1 7.0 5.9(L)  Total Bilirubin 0.3 - 1.2 mg/dL 0.7 0.5 0.3  Alkaline Phos 38 - 126 U/L 78 83 78  AST 15 - 41 U/L 18 25 22   ALT 0 - 44 U/L 17 32 16   CBC Latest Ref Rng & Units 10/23/2019 10/05/2019  09/16/2019  WBC 4.0 - 10.5 K/uL 12.6(H) 11.6(H) 9.0  Hemoglobin 12.0 - 15.0 g/dL 10.8(L) 10.3(L) 9.1(L)  Hematocrit 36 - 46 % 35.1(L) 33.8(L) 29.0(L)  Platelets 150 - 400 K/uL 229 283 320   Lipid Panel     Component Value Date/Time   CHOL 160 03/06/2014 1425   TRIG 125 03/06/2014 1425   HDL 57 03/06/2014 1425  CHOLHDL 2.8 03/06/2014 1425   VLDL 25 03/06/2014 1425   LDLCALC 78 03/06/2014 1425   HEMOGLOBIN A1C Lab Results  Component Value Date   HGBA1C 8.6 (H) 08/23/2019   MPG 200.12 08/23/2019   TSH Recent Labs    08/24/19 0538  TSH 7.630*    PRN Meds:. Medications Discontinued During This Encounter  Medication Reason   clopidogrel (PLAVIX) 75 MG tablet Completed Course   insulin glargine (LANTUS) 100 UNIT/ML injection No longer needed (for PRN medications)   amLODipine (NORVASC) 5 MG tablet Reorder   sacubitril-valsartan (ENTRESTO) 97-103 MG Reorder   carvedilol (COREG) 12.5 MG tablet    spironolactone (ALDACTONE) 50 MG tablet Reorder   acetaminophen (TYLENOL) 325 MG tablet Patient has not taken in last 30 days   levothyroxine (SYNTHROID) 150 MCG tablet Change in therapy   lidocaine (LIDODERM) 5 % Patient Preference   senna (SENOKOT) 8.6 MG TABS tablet No longer needed (for PRN medications)   traZODone (DESYREL) 50 MG tablet No longer needed (for PRN medications)   zinc sulfate 220 (50 Zn) MG capsule Patient Preference   Current Meds  Medication Sig   Accu-Chek Softclix Lancets lancets Use to monitor glucose levels 4 times per day; E11.9   albuterol (PROAIR HFA) 108 (90 BASE) MCG/ACT inhaler Inhale 2 puffs into the lungs every 6 (six) hours as needed for wheezing or shortness of breath.    albuterol (PROVENTIL) (2.5 MG/3ML) 0.083% nebulizer solution Take 2.5 mg by nebulization every 6 (six) hours as needed for wheezing.   ALPRAZolam (XANAX) 0.5 MG tablet Take 0.5 mg 2 (two) times daily as needed by mouth for anxiety or sleep.    amLODipine (NORVASC)  5 MG tablet Take 1 tablet (5 mg total) by mouth daily.   ascorbic acid (VITAMIN C) 500 MG tablet Take 1 tablet (500 mg total) by mouth 2 (two) times daily.   aspirin 81 MG chewable tablet Chew 81 mg by mouth daily.   azaTHIOprine (IMURAN) 50 MG tablet Take 50 mg by mouth daily. Two tablets twice daily   B-D UF III MINI PEN NEEDLES 31G X 5 MM MISC USE AS DIRECTED TWICE A DAY   Belimumab (BENLYSTA) 200 MG/ML SOAJ Inject into the skin.   BIDIL 20-37.5 MG tablet TAKE TWO (2) TABLETS BY MOUTH THREE TIMES DAILY  (Patient taking differently: Take 2 tablets by mouth 3 (three) times daily. )   buPROPion (WELLBUTRIN XL) 300 MG 24 hr tablet Take 300 mg by mouth daily.   carvedilol (COREG) 25 MG tablet Take 1 tablet (25 mg total) by mouth 2 (two) times daily with a meal.   cetaphil (CETAPHIL) lotion Apply topically 2 (two) times daily.   cetirizine (ZYRTEC) 10 MG tablet Take 10 mg by mouth daily as needed for allergies.    clotrimazole-betamethasone (LOTRISONE) cream Apply 1 application topically 2 (two) times daily.   cycloSPORINE (RESTASIS) 0.05 % ophthalmic emulsion Place 1 drop into both eyes in the morning, at noon, and at bedtime.    diclofenac Sodium (VOLTAREN) 1 % GEL Apply 2 g topically 4 (four) times daily.   EASY TOUCH PEN NEEDLES 32G X 4 MM MISC USE TO INJECT INSULIN TWICE DAILY   etonogestrel-ethinyl estradiol (NUVARING) 0.12-0.015 MG/24HR vaginal ring Place 1 each every 28 (twenty-eight) days vaginally. Insert vaginally and leave in place for 3 consecutive weeks, then remove for 1 week.   exenatide (BYETTA 10 MCG PEN) 10 MCG/0.04ML SOPN injection INJECT 0.04ML UNDER THE SKIN 2 TIMES DAILY WITH  A MEAL   famotidine (PEPCID) 20 MG tablet Take 20 mg by mouth 2 (two) times daily.   fluconazole (DIFLUCAN) 100 MG tablet Take 1 tablet (100 mg total) by mouth daily.   fluticasone (FLONASE) 50 MCG/ACT nasal spray Place 2 sprays into both nostrils continuous as needed for allergies or  rhinitis.   glucose blood (ACCU-CHEK AVIVA PLUS) test strip Use to check blood sugar 4 times daily.   hydrocortisone (ANUSOL-HC) 2.5 % rectal cream Place rectally 2 (two) times daily.   hydroxychloroquine (PLAQUENIL) 200 MG tablet Take 200 mg by mouth 2 (two) times daily.    insulin NPH Human (NOVOLIN N) 100 UNIT/ML injection Inject 170 Units into the skin daily before breakfast.   Insulin Syringe-Needle U-100 (EASY TOUCH INSULIN SYRINGE) 31G X 5/16" 1 ML MISC USE AS DIRECTED THREE TIMES A DAY   levothyroxine (SYNTHROID) 50 MCG tablet Take 1 tablet (50 mcg total) by mouth daily at 6 (six) AM.   megestrol (MEGACE) 40 MG tablet Take 40 mg by mouth daily.   metFORMIN (GLUCOPHAGE-XR) 500 MG 24 hr tablet Take 1,000 mg by mouth 2 (two) times daily.   mometasone-formoterol (DULERA) 100-5 MCG/ACT AERO Inhale 2 puffs into the lungs 2 (two) times daily.   Multiple Vitamin (MULTIVITAMIN WITH MINERALS) TABS tablet Take 1 tablet by mouth daily.   oxybutynin (DITROPAN-XL) 10 MG 24 hr tablet Take 20 mg by mouth daily.    pravastatin (PRAVACHOL) 10 MG tablet Take 10 mg by mouth daily.   predniSONE (DELTASONE) 1 MG tablet Take 5 mg by mouth daily.    pregabalin (LYRICA) 75 MG capsule Take 75 mg 3 (three) times daily by mouth.    promethazine (PHENERGAN) 25 MG suppository Place 1 suppository (25 mg total) rectally every 6 (six) hours as needed for nausea or vomiting.   sacubitril-valsartan (ENTRESTO) 97-103 MG Take 1 tablet by mouth 2 (two) times daily.   spironolactone (ALDACTONE) 50 MG tablet Take 1 tablet (50 mg total) by mouth daily.   sucralfate (CARAFATE) 1 GM/10ML suspension Take 1 g by mouth 2 (two) times daily.   topiramate (TOPAMAX) 25 MG tablet Take 75 mg by mouth at bedtime.   torsemide (DEMADEX) 20 MG tablet Take 20 mg by mouth 2 (two) times daily.   traMADol (ULTRAM) 50 MG tablet TAKE 1 TABLETS BY MOUTH EVERY 6 HOURS AS NEEDED FOR PAIN   [DISCONTINUED] amLODipine (NORVASC) 5  MG tablet Take 1 tablet (5 mg total) by mouth daily.   [DISCONTINUED] carvedilol (COREG) 12.5 MG tablet Take 1 tablet (12.5 mg total) by mouth 2 (two) times daily with a meal. (Patient taking differently: Take 12.5 mg by mouth 2 (two) times daily with a meal. Pt takes one and one half tablets daily)   [DISCONTINUED] clopidogrel (PLAVIX) 75 MG tablet Take 75 mg by mouth daily.   [DISCONTINUED] sacubitril-valsartan (ENTRESTO) 97-103 MG Take 1 tablet by mouth 2 (two) times daily.   [DISCONTINUED] spironolactone (ALDACTONE) 50 MG tablet Take 50 mg by mouth daily.    Cardiac Studies:   Coronary angiogram 02/07/2012: Normal coronary arteries and LVEF. Left dominant circulation.  Carotid duplex 03/06/2014: The vertebral arteries appear patent with antegrade flow. Findings consistent with 1-39 percent stenosis involving theright internal carotid artery and the left internal carotidartery.  Vascular Ultrasound Lower Extremity Venous 03/26/2018: Right: There is no evidence of deep vein thrombosis in the lower extremity. No cystic structure found in the popliteal fossa. Left: No evidence of common femoral vein obstruction.  Echocardiogram  12/19/2017: Left ventricle cavity is mildly dilated. Mild concentric hypertrophy of the left ventricle. Mild decrease in global wall motion. Doppler evidence of grade I (impaired) diastolic dysfunction, normal LAP. Calculated EF 47%. Left atrial cavity is moderately dilated. Mild (Grade I) mitral regurgitation. Mild prolapse of the mitral valve leaflets. Mild tricuspid regurgitation. No evidence of pulmonary hypertension. No significant change compared to previous study dated 03/09/2017.  EKG:  EKG 01/21/2020: Normal sinus rhythm with rate of 100 bpm, left axis deviation, poor R wave progression, cannot exclude anterolateral infarct old.  Low-voltage chest leads, consider pulmonary disease pattern.  Nonspecific T abnormality.  Assessment:     ICD-10-CM   1.  Chronic diastolic congestive heart failure (HCC)  I50.32 EKG 12-Lead    carvedilol (COREG) 25 MG tablet    sacubitril-valsartan (ENTRESTO) 97-103 MG    amLODipine (NORVASC) 5 MG tablet    spironolactone (ALDACTONE) 50 MG tablet    PCV ECHOCARDIOGRAM COMPLETE  2. Non-ischemic cardiomyopathy (HCC)  I42.8 PCV ECHOCARDIOGRAM COMPLETE  3. Essential hypertension  I10 amLODipine (NORVASC) 5 MG tablet    Basic metabolic panel  4. Class 3 severe obesity due to excess calories with serious comorbidity and body mass index (BMI) of 50.0 to 59.9 in adult [facility])  E66.01    Z68.43    Recommendations:   AMEE BOOTHE  is a 49 y.o. female  with Sjogren's syndrome, SLE, chronic systolic heart failure now with improved EF to 45-50% on echo in Nov 2019, morbid obesity, obesity hypoventilation syndrome, uncontrolled type 2 DM, hypertension, h/o CVA in 2014, OSA on CPAP and hepatic  adenoma s/p embolization on 08/12/19.  I have reviewed her recent events including hepatic adenoma embolization, recent emergency room visits, updated her labs.  Fortunately in spite of all of this, she has not had any acute decompensated heart failure.  She is presently on moderate dose carvedilol and high-dose Entresto and amlodipine along with spironolactone.  I could not see Aldactone in my previous chart, but patient has been taking 50 mg daily, I would like to obtain a BMP in couple weeks to follow-up on renal function and potassium.  For hypertension which is uncontrolled today, I have increased carvedilol from 12.5 mg twice daily to 25 mg twice daily also in view of underlying sinus tachycardia as well.  I would like to repeat an echocardiogram to reevaluate her LV systolic function for stability.  I would like to see her back in 2 to 3 months for follow-up.   Adrian Prows, MD, [facility] 01/21/2020, 9:20 PM Office: [phone]

## 2020-01-24 ENCOUNTER — Ambulatory Visit: Payer: Medicaid Other | Admitting: Endocrinology

## 2020-01-28 ENCOUNTER — Ambulatory Visit: Payer: Medicaid Other | Admitting: Endocrinology

## 2020-01-28 ENCOUNTER — Other Ambulatory Visit: Payer: Self-pay

## 2020-01-28 ENCOUNTER — Other Ambulatory Visit: Payer: Medicaid Other

## 2020-01-28 ENCOUNTER — Inpatient Hospital Stay (HOSPITAL_COMMUNITY)
Admission: EM | Admit: 2020-01-28 | Discharge: 2020-02-07 | DRG: 871 | Disposition: A | Discharge status: Home Health | Payer: Medicaid Other | Attending: Internal Medicine | Admitting: Internal Medicine

## 2020-01-28 ENCOUNTER — Encounter (HOSPITAL_COMMUNITY): Payer: Self-pay | Admitting: Emergency Medicine

## 2020-01-28 DIAGNOSIS — E039 Hypothyroidism, unspecified: Secondary | ICD-10-CM | POA: Diagnosis present

## 2020-01-28 DIAGNOSIS — I69354 Hemiplegia and hemiparesis following cerebral infarction affecting left non-dominant side: Secondary | ICD-10-CM

## 2020-01-28 DIAGNOSIS — I5022 Chronic systolic (congestive) heart failure: Secondary | ICD-10-CM | POA: Diagnosis present

## 2020-01-28 DIAGNOSIS — E1143 Type 2 diabetes mellitus with diabetic autonomic (poly)neuropathy: Secondary | ICD-10-CM | POA: Diagnosis present

## 2020-01-28 DIAGNOSIS — I110 Hypertensive heart disease with heart failure: Secondary | ICD-10-CM | POA: Diagnosis present

## 2020-01-28 DIAGNOSIS — F419 Anxiety disorder, unspecified: Secondary | ICD-10-CM | POA: Diagnosis present

## 2020-01-28 DIAGNOSIS — K219 Gastro-esophageal reflux disease without esophagitis: Secondary | ICD-10-CM | POA: Diagnosis present

## 2020-01-28 DIAGNOSIS — I160 Hypertensive urgency: Secondary | ICD-10-CM | POA: Diagnosis present

## 2020-01-28 DIAGNOSIS — K3184 Gastroparesis: Secondary | ICD-10-CM | POA: Diagnosis present

## 2020-01-28 DIAGNOSIS — E1165 Type 2 diabetes mellitus with hyperglycemia: Secondary | ICD-10-CM | POA: Diagnosis present

## 2020-01-28 DIAGNOSIS — J9601 Acute respiratory failure with hypoxia: Secondary | ICD-10-CM | POA: Diagnosis present

## 2020-01-28 DIAGNOSIS — Z9114 Patient's other noncompliance with medication regimen: Secondary | ICD-10-CM

## 2020-01-28 DIAGNOSIS — Y92230 Patient room in hospital as the place of occurrence of the external cause: Secondary | ICD-10-CM | POA: Diagnosis present

## 2020-01-28 DIAGNOSIS — Z833 Family history of diabetes mellitus: Secondary | ICD-10-CM

## 2020-01-28 DIAGNOSIS — M3500 Sicca syndrome, unspecified: Secondary | ICD-10-CM | POA: Diagnosis present

## 2020-01-28 DIAGNOSIS — Z885 Allergy status to narcotic agent status: Secondary | ICD-10-CM

## 2020-01-28 DIAGNOSIS — I10 Essential (primary) hypertension: Secondary | ICD-10-CM | POA: Diagnosis present

## 2020-01-28 DIAGNOSIS — E119 Type 2 diabetes mellitus without complications: Secondary | ICD-10-CM

## 2020-01-28 DIAGNOSIS — I5042 Chronic combined systolic (congestive) and diastolic (congestive) heart failure: Secondary | ICD-10-CM

## 2020-01-28 DIAGNOSIS — M329 Systemic lupus erythematosus, unspecified: Secondary | ICD-10-CM | POA: Diagnosis present

## 2020-01-28 DIAGNOSIS — U071 COVID-19: Secondary | ICD-10-CM | POA: Diagnosis present

## 2020-01-28 DIAGNOSIS — I152 Hypertension secondary to endocrine disorders: Secondary | ICD-10-CM | POA: Diagnosis present

## 2020-01-28 DIAGNOSIS — Z823 Family history of stroke: Secondary | ICD-10-CM

## 2020-01-28 DIAGNOSIS — Z7982 Long term (current) use of aspirin: Secondary | ICD-10-CM

## 2020-01-28 DIAGNOSIS — Z79899 Other long term (current) drug therapy: Secondary | ICD-10-CM

## 2020-01-28 DIAGNOSIS — N390 Urinary tract infection, site not specified: Secondary | ICD-10-CM | POA: Diagnosis present

## 2020-01-28 DIAGNOSIS — E785 Hyperlipidemia, unspecified: Secondary | ICD-10-CM | POA: Diagnosis present

## 2020-01-28 DIAGNOSIS — I5032 Chronic diastolic (congestive) heart failure: Secondary | ICD-10-CM

## 2020-01-28 DIAGNOSIS — Z91128 Patient's intentional underdosing of medication regimen for other reason: Secondary | ICD-10-CM

## 2020-01-28 DIAGNOSIS — Y92009 Unspecified place in unspecified non-institutional (private) residence as the place of occurrence of the external cause: Secondary | ICD-10-CM

## 2020-01-28 DIAGNOSIS — J1282 Pneumonia due to coronavirus disease 2019: Secondary | ICD-10-CM | POA: Diagnosis present

## 2020-01-28 DIAGNOSIS — Z880 Allergy status to penicillin: Secondary | ICD-10-CM

## 2020-01-28 DIAGNOSIS — B9689 Other specified bacterial agents as the cause of diseases classified elsewhere: Secondary | ICD-10-CM | POA: Diagnosis present

## 2020-01-28 DIAGNOSIS — Z6841 Body Mass Index (BMI) 40.0 and over, adult: Secondary | ICD-10-CM

## 2020-01-28 DIAGNOSIS — G4733 Obstructive sleep apnea (adult) (pediatric): Secondary | ICD-10-CM | POA: Diagnosis present

## 2020-01-28 DIAGNOSIS — Z79890 Hormone replacement therapy (postmenopausal): Secondary | ICD-10-CM

## 2020-01-28 DIAGNOSIS — A4189 Other specified sepsis: Principal | ICD-10-CM | POA: Diagnosis present

## 2020-01-28 DIAGNOSIS — R7881 Bacteremia: Secondary | ICD-10-CM

## 2020-01-28 DIAGNOSIS — Z888 Allergy status to other drugs, medicaments and biological substances status: Secondary | ICD-10-CM

## 2020-01-28 DIAGNOSIS — Z87891 Personal history of nicotine dependence: Secondary | ICD-10-CM

## 2020-01-28 DIAGNOSIS — E1169 Type 2 diabetes mellitus with other specified complication: Secondary | ICD-10-CM

## 2020-01-28 DIAGNOSIS — Z794 Long term (current) use of insulin: Secondary | ICD-10-CM

## 2020-01-28 DIAGNOSIS — N179 Acute kidney failure, unspecified: Secondary | ICD-10-CM | POA: Diagnosis present

## 2020-01-28 DIAGNOSIS — T50916A Underdosing of multiple unspecified drugs, medicaments and biological substances, initial encounter: Secondary | ICD-10-CM | POA: Diagnosis present

## 2020-01-28 DIAGNOSIS — Z8249 Family history of ischemic heart disease and other diseases of the circulatory system: Secondary | ICD-10-CM

## 2020-01-28 DIAGNOSIS — E1159 Type 2 diabetes mellitus with other circulatory complications: Secondary | ICD-10-CM | POA: Diagnosis present

## 2020-01-28 DIAGNOSIS — T380X5A Adverse effect of glucocorticoids and synthetic analogues, initial encounter: Secondary | ICD-10-CM | POA: Diagnosis present

## 2020-01-28 LAB — CBC WITH DIFFERENTIAL/PLATELET
Abs Immature Granulocytes: 0.03 10*3/uL (ref 0.00–0.07)
Basophils Absolute: 0 10*3/uL (ref 0.0–0.1)
Basophils Relative: 0 %
Eosinophils Absolute: 0 10*3/uL (ref 0.0–0.5)
Eosinophils Relative: 0 %
HCT: 36.7 % (ref 36.0–46.0)
Hemoglobin: 11.6 g/dL — ABNORMAL LOW (ref 12.0–15.0)
Immature Granulocytes: 0 %
Lymphocytes Relative: 20 %
Lymphs Abs: 1.3 10*3/uL (ref 0.7–4.0)
MCH: 27.8 pg (ref 26.0–34.0)
MCHC: 31.6 g/dL (ref 30.0–36.0)
MCV: 87.8 fL (ref 80.0–100.0)
Monocytes Absolute: 0.5 10*3/uL (ref 0.1–1.0)
Monocytes Relative: 7 %
Neutro Abs: 4.8 10*3/uL (ref 1.7–7.7)
Neutrophils Relative %: 73 %
Platelets: 215 10*3/uL (ref 150–400)
RBC: 4.18 MIL/uL (ref 3.87–5.11)
RDW: 13.2 % (ref 11.5–15.5)
WBC: 6.7 10*3/uL (ref 4.0–10.5)
nRBC: 0 % (ref 0.0–0.2)

## 2020-01-28 LAB — COMPREHENSIVE METABOLIC PANEL
ALT: 14 U/L (ref 0–44)
AST: 47 U/L — ABNORMAL HIGH (ref 15–41)
Albumin: 2.8 g/dL — ABNORMAL LOW (ref 3.5–5.0)
Alkaline Phosphatase: 77 U/L (ref 38–126)
Anion gap: 11 (ref 5–15)
BUN: 9 mg/dL (ref 6–20)
CO2: 19 mmol/L — ABNORMAL LOW (ref 22–32)
Calcium: 8.5 mg/dL — ABNORMAL LOW (ref 8.9–10.3)
Chloride: 107 mmol/L (ref 98–111)
Creatinine, Ser: 0.98 mg/dL (ref 0.44–1.00)
GFR calc Af Amer: >60 mL/min (ref >60)
GFR calc non Af Amer: >60 mL/min (ref >60)
Glucose, Bld: 163 mg/dL — ABNORMAL HIGH (ref 70–99)
Potassium: 3.7 mmol/L (ref 3.5–5.1)
Sodium: 137 mmol/L (ref 135–145)
Total Bilirubin: 0.5 mg/dL (ref 0.3–1.2)
Total Protein: 6.6 g/dL (ref 6.5–8.1)

## 2020-01-28 MED ORDER — IBUPROFEN 400 MG PO TABS
600.0000 mg | ORAL_TABLET | Freq: Once | ORAL | Status: AC
  Administered 2020-01-28: 600 mg via ORAL
  Filled 2020-01-28: qty 1

## 2020-01-28 NOTE — ED Triage Notes (Signed)
Pt brought to ED by [facility] from home for c/o SOB, low grade fever, nausea, generalized body aches, sore throat for the past 2 days. BP 200/110, HR 108, R 29, SPO2 97% RA, CBG 145. No vaccinated for COVID.

## 2020-01-28 NOTE — ED Notes (Signed)
Patient reports she feels lightheaded and SOB. Encouraged patient to sit back in chair instead of leaning forward. O2 checked and noted to be 97% RA. Patient in NAD.

## 2020-01-29 ENCOUNTER — Emergency Department (HOSPITAL_COMMUNITY): Payer: Medicaid Other

## 2020-01-29 DIAGNOSIS — U071 COVID-19: Secondary | ICD-10-CM | POA: Diagnosis present

## 2020-01-29 DIAGNOSIS — E785 Hyperlipidemia, unspecified: Secondary | ICD-10-CM | POA: Diagnosis present

## 2020-01-29 DIAGNOSIS — G4733 Obstructive sleep apnea (adult) (pediatric): Secondary | ICD-10-CM | POA: Diagnosis present

## 2020-01-29 DIAGNOSIS — E1165 Type 2 diabetes mellitus with hyperglycemia: Secondary | ICD-10-CM | POA: Diagnosis present

## 2020-01-29 DIAGNOSIS — B9689 Other specified bacterial agents as the cause of diseases classified elsewhere: Secondary | ICD-10-CM | POA: Diagnosis present

## 2020-01-29 DIAGNOSIS — Y92230 Patient room in hospital as the place of occurrence of the external cause: Secondary | ICD-10-CM | POA: Diagnosis present

## 2020-01-29 DIAGNOSIS — J1282 Pneumonia due to coronavirus disease 2019: Secondary | ICD-10-CM | POA: Diagnosis present

## 2020-01-29 DIAGNOSIS — K219 Gastro-esophageal reflux disease without esophagitis: Secondary | ICD-10-CM | POA: Diagnosis present

## 2020-01-29 DIAGNOSIS — I160 Hypertensive urgency: Secondary | ICD-10-CM | POA: Diagnosis present

## 2020-01-29 DIAGNOSIS — A4189 Other specified sepsis: Secondary | ICD-10-CM | POA: Diagnosis present

## 2020-01-29 DIAGNOSIS — J9601 Acute respiratory failure with hypoxia: Secondary | ICD-10-CM | POA: Diagnosis present

## 2020-01-29 DIAGNOSIS — N390 Urinary tract infection, site not specified: Secondary | ICD-10-CM | POA: Diagnosis present

## 2020-01-29 DIAGNOSIS — I10 Essential (primary) hypertension: Secondary | ICD-10-CM | POA: Diagnosis not present

## 2020-01-29 DIAGNOSIS — E1143 Type 2 diabetes mellitus with diabetic autonomic (poly)neuropathy: Secondary | ICD-10-CM | POA: Diagnosis present

## 2020-01-29 DIAGNOSIS — Z6841 Body Mass Index (BMI) 40.0 and over, adult: Secondary | ICD-10-CM | POA: Diagnosis not present

## 2020-01-29 DIAGNOSIS — I5022 Chronic systolic (congestive) heart failure: Secondary | ICD-10-CM | POA: Diagnosis present

## 2020-01-29 DIAGNOSIS — R7881 Bacteremia: Secondary | ICD-10-CM | POA: Diagnosis not present

## 2020-01-29 DIAGNOSIS — N179 Acute kidney failure, unspecified: Secondary | ICD-10-CM | POA: Diagnosis present

## 2020-01-29 DIAGNOSIS — M329 Systemic lupus erythematosus, unspecified: Secondary | ICD-10-CM

## 2020-01-29 DIAGNOSIS — F419 Anxiety disorder, unspecified: Secondary | ICD-10-CM | POA: Diagnosis present

## 2020-01-29 DIAGNOSIS — I110 Hypertensive heart disease with heart failure: Secondary | ICD-10-CM | POA: Diagnosis present

## 2020-01-29 DIAGNOSIS — E039 Hypothyroidism, unspecified: Secondary | ICD-10-CM | POA: Diagnosis present

## 2020-01-29 DIAGNOSIS — K3184 Gastroparesis: Secondary | ICD-10-CM | POA: Diagnosis present

## 2020-01-29 DIAGNOSIS — Z9114 Patient's other noncompliance with medication regimen: Secondary | ICD-10-CM | POA: Diagnosis not present

## 2020-01-29 DIAGNOSIS — M3500 Sicca syndrome, unspecified: Secondary | ICD-10-CM | POA: Diagnosis present

## 2020-01-29 DIAGNOSIS — Y92009 Unspecified place in unspecified non-institutional (private) residence as the place of occurrence of the external cause: Secondary | ICD-10-CM | POA: Diagnosis not present

## 2020-01-29 DIAGNOSIS — I69354 Hemiplegia and hemiparesis following cerebral infarction affecting left non-dominant side: Secondary | ICD-10-CM | POA: Diagnosis not present

## 2020-01-29 LAB — D-DIMER, QUANTITATIVE: D-Dimer, Quant: 2.02 ug/mL-FEU — ABNORMAL HIGH (ref 0.00–0.50)

## 2020-01-29 LAB — URINALYSIS, ROUTINE W REFLEX MICROSCOPIC
Bilirubin Urine: NEGATIVE
Cellular Cast, UA: 3
Glucose, UA: NEGATIVE mg/dL
Ketones, ur: 5 mg/dL — AB
Nitrite: NEGATIVE
Protein, ur: >=300 mg/dL — AB
Specific Gravity, Urine: 1.035 — ABNORMAL HIGH (ref 1.005–1.030)
WBC, UA: >50 WBC/hpf — ABNORMAL HIGH (ref 0–5)
pH: 5 (ref 5.0–8.0)

## 2020-01-29 LAB — FERRITIN: Ferritin: 403 ng/mL — ABNORMAL HIGH (ref 11–307)

## 2020-01-29 LAB — LACTATE DEHYDROGENASE: LDH: 244 U/L — ABNORMAL HIGH (ref 98–192)

## 2020-01-29 LAB — LACTIC ACID, PLASMA: Lactic Acid, Venous: 1.4 mmol/L (ref 0.5–1.9)

## 2020-01-29 LAB — SARS CORONAVIRUS 2 BY RT PCR (HOSPITAL ORDER, PERFORMED IN ~~LOC~~ HOSPITAL LAB): SARS Coronavirus 2: POSITIVE — AB

## 2020-01-29 LAB — HEPATITIS B SURFACE ANTIGEN: Hepatitis B Surface Ag: NONREACTIVE

## 2020-01-29 LAB — I-STAT BETA HCG BLOOD, ED (MC, WL, AP ONLY): I-stat hCG, quantitative: <5 m[IU]/mL (ref <5)

## 2020-01-29 LAB — TROPONIN I (HIGH SENSITIVITY): Troponin I (High Sensitivity): 8 ng/L (ref <18)

## 2020-01-29 LAB — C-REACTIVE PROTEIN: CRP: 8 mg/dL — ABNORMAL HIGH (ref <1.0)

## 2020-01-29 LAB — TYPE AND SCREEN
ABO/RH(D): O POS
Antibody Screen: NEGATIVE

## 2020-01-29 LAB — CBG MONITORING, ED: Glucose-Capillary: 219 mg/dL — ABNORMAL HIGH (ref 70–99)

## 2020-01-29 LAB — GLUCOSE, CAPILLARY: Glucose-Capillary: 204 mg/dL — ABNORMAL HIGH (ref 70–99)

## 2020-01-29 LAB — FIBRINOGEN: Fibrinogen: 565 mg/dL — ABNORMAL HIGH (ref 210–475)

## 2020-01-29 LAB — BRAIN NATRIURETIC PEPTIDE: B Natriuretic Peptide: 35.8 pg/mL (ref 0.0–100.0)

## 2020-01-29 LAB — PROCALCITONIN: Procalcitonin: <0.1 ng/mL

## 2020-01-29 MED ORDER — ACETAMINOPHEN 325 MG PO TABS
650.0000 mg | ORAL_TABLET | Freq: Once | ORAL | Status: AC | PRN
  Administered 2020-01-29: 650 mg via ORAL
  Filled 2020-01-29: qty 2

## 2020-01-29 MED ORDER — LEVOTHYROXINE SODIUM 50 MCG PO TABS
50.0000 ug | ORAL_TABLET | Freq: Every day | ORAL | Status: DC
  Administered 2020-01-30 – 2020-02-07 (×9): 50 ug via ORAL
  Filled 2020-01-29 (×9): qty 1

## 2020-01-29 MED ORDER — ALUM & MAG HYDROXIDE-SIMETH 200-200-20 MG/5ML PO SUSP
30.0000 mL | Freq: Once | ORAL | Status: AC
  Administered 2020-01-29: 30 mL via ORAL
  Filled 2020-01-29: qty 30

## 2020-01-29 MED ORDER — FLUTICASONE PROPIONATE 50 MCG/ACT NA SUSP
2.0000 | Freq: Every day | NASAL | Status: DC | PRN
  Filled 2020-01-29: qty 16

## 2020-01-29 MED ORDER — TRAMADOL HCL 50 MG PO TABS
50.0000 mg | ORAL_TABLET | Freq: Four times a day (QID) | ORAL | Status: DC | PRN
  Administered 2020-01-29: 50 mg via ORAL
  Filled 2020-01-29: qty 1

## 2020-01-29 MED ORDER — HYDROXYCHLOROQUINE SULFATE 200 MG PO TABS
200.0000 mg | ORAL_TABLET | Freq: Two times a day (BID) | ORAL | Status: DC
  Administered 2020-01-29 – 2020-02-07 (×18): 200 mg via ORAL
  Filled 2020-01-29 (×19): qty 1

## 2020-01-29 MED ORDER — PREGABALIN 75 MG PO CAPS
75.0000 mg | ORAL_CAPSULE | Freq: Three times a day (TID) | ORAL | Status: DC
  Administered 2020-01-29 – 2020-02-07 (×26): 75 mg via ORAL
  Filled 2020-01-29 (×26): qty 1

## 2020-01-29 MED ORDER — SODIUM CHLORIDE 0.9 % IV SOLN
200.0000 mg | Freq: Once | INTRAVENOUS | Status: AC
  Administered 2020-01-29: 200 mg via INTRAVENOUS
  Filled 2020-01-29: qty 40

## 2020-01-29 MED ORDER — AZATHIOPRINE 50 MG PO TABS
150.0000 mg | ORAL_TABLET | Freq: Two times a day (BID) | ORAL | Status: DC
  Administered 2020-01-29 – 2020-02-07 (×18): 150 mg via ORAL
  Filled 2020-01-29 (×19): qty 3

## 2020-01-29 MED ORDER — FAMOTIDINE 20 MG PO TABS
20.0000 mg | ORAL_TABLET | Freq: Two times a day (BID) | ORAL | Status: DC
  Administered 2020-01-29 – 2020-02-07 (×18): 20 mg via ORAL
  Filled 2020-01-29 (×18): qty 1

## 2020-01-29 MED ORDER — GUAIFENESIN-DM 100-10 MG/5ML PO SYRP
10.0000 mL | ORAL_SOLUTION | ORAL | Status: DC | PRN
  Administered 2020-01-30 – 2020-02-04 (×9): 10 mL via ORAL
  Filled 2020-01-29 (×9): qty 10

## 2020-01-29 MED ORDER — SUCRALFATE 1 GM/10ML PO SUSP
1.0000 g | Freq: Two times a day (BID) | ORAL | Status: DC
  Administered 2020-01-29 – 2020-02-07 (×18): 1 g via ORAL
  Filled 2020-01-29 (×20): qty 10

## 2020-01-29 MED ORDER — SODIUM CHLORIDE 0.9 % IV SOLN
INTRAVENOUS | Status: DC | PRN
  Administered 2020-01-29: 250 mL via INTRAVENOUS

## 2020-01-29 MED ORDER — HYDROCOD POLST-CPM POLST ER 10-8 MG/5ML PO SUER: 5.0000 mL | Freq: Two times a day (BID) | ORAL | Status: DC | PRN

## 2020-01-29 MED ORDER — CYCLOSPORINE 0.05 % OP EMUL
1.0000 [drp] | Freq: Two times a day (BID) | OPHTHALMIC | Status: DC
  Administered 2020-01-29 – 2020-02-07 (×18): 1 [drp] via OPHTHALMIC
  Filled 2020-01-29 (×19): qty 1

## 2020-01-29 MED ORDER — SODIUM CHLORIDE 0.9 % IV SOLN
100.0000 mg | Freq: Every day | INTRAVENOUS | Status: AC
  Administered 2020-01-30 – 2020-02-02 (×4): 100 mg via INTRAVENOUS
  Filled 2020-01-29 (×4): qty 20

## 2020-01-29 MED ORDER — ISOSORB DINITRATE-HYDRALAZINE 20-37.5 MG PO TABS
2.0000 | ORAL_TABLET | Freq: Three times a day (TID) | ORAL | Status: DC
  Administered 2020-01-29 – 2020-01-30 (×3): 2 via ORAL
  Filled 2020-01-29 (×4): qty 2

## 2020-01-29 MED ORDER — ALBUTEROL SULFATE HFA 108 (90 BASE) MCG/ACT IN AERS
2.0000 | INHALATION_SPRAY | Freq: Four times a day (QID) | RESPIRATORY_TRACT | Status: DC
  Administered 2020-01-29 – 2020-02-07 (×35): 2 via RESPIRATORY_TRACT
  Filled 2020-01-29 (×3): qty 6.7

## 2020-01-29 MED ORDER — DICLOFENAC SODIUM 1 % EX GEL
2.0000 g | Freq: Every day | CUTANEOUS | Status: DC | PRN
  Filled 2020-01-29: qty 100

## 2020-01-29 MED ORDER — CETAPHIL MOISTURIZING EX LOTN
1.0000 "application " | TOPICAL_LOTION | Freq: Two times a day (BID) | CUTANEOUS | Status: DC
  Administered 2020-01-30 – 2020-02-07 (×16): 1 via TOPICAL
  Filled 2020-01-29: qty 473

## 2020-01-29 MED ORDER — ACETAMINOPHEN 325 MG PO TABS
650.0000 mg | ORAL_TABLET | Freq: Four times a day (QID) | ORAL | Status: DC | PRN
  Administered 2020-02-06: 650 mg via ORAL
  Filled 2020-01-29: qty 2

## 2020-01-29 MED ORDER — BUPROPION HCL ER (XL) 150 MG PO TB24
300.0000 mg | ORAL_TABLET | Freq: Every day | ORAL | Status: DC
  Administered 2020-01-29 – 2020-02-07 (×10): 300 mg via ORAL
  Filled 2020-01-29 (×10): qty 2

## 2020-01-29 MED ORDER — DOXEPIN HCL 50 MG PO CAPS
50.0000 mg | ORAL_CAPSULE | Freq: Every evening | ORAL | Status: DC | PRN
  Filled 2020-01-29: qty 2

## 2020-01-29 MED ORDER — HYDROMORPHONE HCL 1 MG/ML IJ SOLN
0.5000 mg | Freq: Once | INTRAMUSCULAR | Status: AC
  Administered 2020-01-29: 0.5 mg via INTRAVENOUS
  Filled 2020-01-29: qty 1

## 2020-01-29 MED ORDER — TORSEMIDE 20 MG PO TABS
20.0000 mg | ORAL_TABLET | Freq: Two times a day (BID) | ORAL | Status: DC
  Administered 2020-01-29: 20 mg via ORAL
  Filled 2020-01-29 (×3): qty 1

## 2020-01-29 MED ORDER — DEXAMETHASONE SODIUM PHOSPHATE 10 MG/ML IJ SOLN
6.0000 mg | Freq: Once | INTRAMUSCULAR | Status: AC
  Administered 2020-01-29: 6 mg via INTRAVENOUS
  Filled 2020-01-29: qty 1

## 2020-01-29 MED ORDER — INSULIN ASPART 100 UNIT/ML ~~LOC~~ SOLN
0.0000 [IU] | Freq: Three times a day (TID) | SUBCUTANEOUS | Status: DC
  Administered 2020-01-29: 5 [IU] via SUBCUTANEOUS
  Administered 2020-01-30: 8 [IU] via SUBCUTANEOUS
  Administered 2020-01-30 (×2): 5 [IU] via SUBCUTANEOUS
  Administered 2020-01-31 (×3): 11 [IU] via SUBCUTANEOUS
  Administered 2020-02-01: 5 [IU] via SUBCUTANEOUS
  Administered 2020-02-01: 8 [IU] via SUBCUTANEOUS
  Administered 2020-02-01: 15 [IU] via SUBCUTANEOUS
  Administered 2020-02-02: 8 [IU] via SUBCUTANEOUS
  Administered 2020-02-02: 5 [IU] via SUBCUTANEOUS
  Administered 2020-02-02: 3 [IU] via SUBCUTANEOUS
  Administered 2020-02-03: 5 [IU] via SUBCUTANEOUS
  Administered 2020-02-03: 3 [IU] via SUBCUTANEOUS
  Administered 2020-02-03: 8 [IU] via SUBCUTANEOUS
  Administered 2020-02-04: 11 [IU] via SUBCUTANEOUS
  Administered 2020-02-04: 3 [IU] via SUBCUTANEOUS
  Administered 2020-02-04: 2 [IU] via SUBCUTANEOUS
  Administered 2020-02-05: 3 [IU] via SUBCUTANEOUS
  Administered 2020-02-05 (×2): 11 [IU] via SUBCUTANEOUS
  Administered 2020-02-06: 3 [IU] via SUBCUTANEOUS
  Administered 2020-02-06: 11 [IU] via SUBCUTANEOUS
  Administered 2020-02-06: 5 [IU] via SUBCUTANEOUS

## 2020-01-29 MED ORDER — ONDANSETRON HCL 4 MG PO TABS: 4.0000 mg | ORAL_TABLET | Freq: Four times a day (QID) | ORAL | Status: DC | PRN

## 2020-01-29 MED ORDER — ENOXAPARIN SODIUM 80 MG/0.8ML ~~LOC~~ SOLN
75.0000 mg | SUBCUTANEOUS | Status: DC
  Administered 2020-01-29 – 2020-02-03 (×6): 75 mg via SUBCUTANEOUS
  Filled 2020-01-29: qty 0.8
  Filled 2020-01-29: qty 0.75
  Filled 2020-01-29 (×4): qty 0.8

## 2020-01-29 MED ORDER — LIDOCAINE VISCOUS HCL 2 % MT SOLN
15.0000 mL | Freq: Once | OROMUCOSAL | Status: AC
  Administered 2020-01-29: 15 mL via ORAL
  Filled 2020-01-29: qty 15

## 2020-01-29 MED ORDER — SPIRONOLACTONE 25 MG PO TABS
50.0000 mg | ORAL_TABLET | Freq: Every day | ORAL | Status: DC
  Administered 2020-01-29 – 2020-01-30 (×2): 50 mg via ORAL
  Filled 2020-01-29 (×2): qty 2

## 2020-01-29 MED ORDER — EXENATIDE 10 MCG/0.04ML ~~LOC~~ SOPN: 10.0000 ug | PEN_INJECTOR | Freq: Two times a day (BID) | SUBCUTANEOUS | Status: DC

## 2020-01-29 MED ORDER — SACUBITRIL-VALSARTAN 97-103 MG PO TABS
1.0000 | ORAL_TABLET | Freq: Two times a day (BID) | ORAL | Status: DC
  Administered 2020-01-29 – 2020-01-30 (×2): 1 via ORAL
  Filled 2020-01-29 (×3): qty 1

## 2020-01-29 MED ORDER — CARVEDILOL 25 MG PO TABS
25.0000 mg | ORAL_TABLET | Freq: Two times a day (BID) | ORAL | Status: DC
  Administered 2020-01-30 (×2): 25 mg via ORAL
  Filled 2020-01-29 (×2): qty 1

## 2020-01-29 MED ORDER — PRAVASTATIN SODIUM 10 MG PO TABS
10.0000 mg | ORAL_TABLET | Freq: Every day | ORAL | Status: DC
  Administered 2020-01-29 – 2020-02-07 (×10): 10 mg via ORAL
  Filled 2020-01-29 (×10): qty 1

## 2020-01-29 MED ORDER — HYDROMORPHONE HCL 1 MG/ML IJ SOLN
0.5000 mg | INTRAMUSCULAR | Status: DC | PRN
  Administered 2020-01-29 – 2020-01-30 (×6): 0.5 mg via INTRAVENOUS
  Filled 2020-01-29 (×6): qty 0.5

## 2020-01-29 MED ORDER — ZINC SULFATE 220 (50 ZN) MG PO CAPS
220.0000 mg | ORAL_CAPSULE | Freq: Every day | ORAL | Status: DC
  Administered 2020-01-29 – 2020-02-07 (×10): 220 mg via ORAL
  Filled 2020-01-29 (×10): qty 1

## 2020-01-29 MED ORDER — AMLODIPINE BESYLATE 5 MG PO TABS
5.0000 mg | ORAL_TABLET | Freq: Every day | ORAL | Status: DC
  Administered 2020-01-29 – 2020-01-30 (×2): 5 mg via ORAL
  Filled 2020-01-29 (×2): qty 1

## 2020-01-29 MED ORDER — ONDANSETRON HCL 4 MG/2ML IJ SOLN
4.0000 mg | Freq: Once | INTRAMUSCULAR | Status: AC
  Administered 2020-01-29: 4 mg via INTRAVENOUS
  Filled 2020-01-29: qty 2

## 2020-01-29 MED ORDER — SODIUM CHLORIDE 0.9 % IV SOLN
2.0000 g | INTRAVENOUS | Status: DC
  Administered 2020-01-29 – 2020-01-30 (×2): 2 g via INTRAVENOUS
  Filled 2020-01-29 (×2): qty 20

## 2020-01-29 MED ORDER — IOHEXOL 300 MG/ML  SOLN
100.0000 mL | Freq: Once | INTRAMUSCULAR | Status: AC | PRN
  Administered 2020-01-29: 100 mL via INTRAVENOUS

## 2020-01-29 MED ORDER — LORATADINE 10 MG PO TABS
10.0000 mg | ORAL_TABLET | Freq: Every day | ORAL | Status: DC
  Administered 2020-01-31 – 2020-02-07 (×7): 10 mg via ORAL
  Filled 2020-01-29 (×10): qty 1

## 2020-01-29 MED ORDER — ASCORBIC ACID 500 MG PO TABS
500.0000 mg | ORAL_TABLET | Freq: Every day | ORAL | Status: DC
  Administered 2020-01-29 – 2020-02-07 (×10): 500 mg via ORAL
  Filled 2020-01-29 (×10): qty 1

## 2020-01-29 MED ORDER — ONDANSETRON HCL 4 MG/2ML IJ SOLN
4.0000 mg | Freq: Four times a day (QID) | INTRAMUSCULAR | Status: DC | PRN
  Administered 2020-01-29 – 2020-02-05 (×15): 4 mg via INTRAVENOUS
  Filled 2020-01-29 (×18): qty 2

## 2020-01-29 MED ORDER — ALPRAZOLAM 0.5 MG PO TABS
0.5000 mg | ORAL_TABLET | Freq: Two times a day (BID) | ORAL | Status: DC | PRN
  Administered 2020-01-31 – 2020-02-06 (×8): 0.5 mg via ORAL
  Filled 2020-01-29 (×8): qty 1

## 2020-01-29 MED ORDER — SODIUM CHLORIDE 0.9% FLUSH
3.0000 mL | Freq: Two times a day (BID) | INTRAVENOUS | Status: DC
  Administered 2020-01-29 – 2020-02-06 (×14): 3 mL via INTRAVENOUS

## 2020-01-29 MED ORDER — PANTOPRAZOLE SODIUM 40 MG IV SOLR
40.0000 mg | Freq: Once | INTRAVENOUS | Status: AC
  Administered 2020-01-29: 40 mg via INTRAVENOUS
  Filled 2020-01-29: qty 40

## 2020-01-29 MED ORDER — MOMETASONE FURO-FORMOTEROL FUM 100-5 MCG/ACT IN AERO
2.0000 | INHALATION_SPRAY | Freq: Two times a day (BID) | RESPIRATORY_TRACT | Status: DC
  Administered 2020-01-29 – 2020-02-07 (×18): 2 via RESPIRATORY_TRACT
  Filled 2020-01-29: qty 8.8

## 2020-01-29 MED ORDER — SODIUM CHLORIDE 0.9 % IV SOLN
500.0000 mg | INTRAVENOUS | Status: DC
  Administered 2020-01-29 – 2020-01-30 (×2): 500 mg via INTRAVENOUS
  Filled 2020-01-29 (×3): qty 500

## 2020-01-29 MED ORDER — DEXAMETHASONE 6 MG PO TABS
6.0000 mg | ORAL_TABLET | Freq: Every day | ORAL | Status: DC
  Administered 2020-01-30 – 2020-02-05 (×7): 6 mg via ORAL
  Filled 2020-01-29 (×8): qty 1

## 2020-01-29 NOTE — H&P (Addendum)
History and Physical    [name] OAC:[id number] DOB: 1971-01-22 DOA: 01/28/2020  Referring MD/NP/PA: Rayna Sexton. MD PCP: Bartholome Bill, MD  Patient coming from: Home via EMS  Chief Complaint: Shortness of breath and nausea  I have personally briefly reviewed patient's old medical records in [place]   HPI: [name] is a 49 y.o. female with medical history significant of systolic CHF, lupus, diabetes mellitus type 2 with gastroparesis, and morbid obesity presents with complaints of shortness of breath and nausea over the last 5 days.  Reports having associated symptoms of subjective fever, chills, cough, congestion, chest tightness, headache, generalized body aches, joint pain, epigastric abdominal pain, and vomiting.  Emesis has been nonbloody in appearance.  Denies having any diarrhea, dysuria, or urinary frequency.  Since symptoms started she has been unable to take any of her medications due to her nausea and vomiting she is followed by Dr. Einar Gip of cardiology in the outpatient setting.  Last EF noted to be around 45 - 50% by echocardiogram from 05/2018.  Patient reports that she did not get her Covid vaccine as her doctors want sure if she should take it.  ED Course: Upon admission into the hospital patient was noted to be febrile up to 103 F, heart rates elevated up to 118, respiration 15-24, blood pressures 137/91-188/88, and O2 saturations currently maintained on on room air.  With ambulation patient's O2 saturations dropped to 89%.  Labs revealed hemoglobin 11.6 and glucose 163.  CT scan of the abdomen pelvis revealed signs of a multifocal pneumonia concerning for Covid.  Covid 19 screening was positive.  Patient had been given Dilaudid, GI cocktail, Zofran, and Protonix.  TRH called to admit.  Review of Systems  Constitutional: Positive for chills, fever and malaise/fatigue.  HENT: Negative for ear discharge and nosebleeds.   Eyes: Negative for photophobia  and pain.  Respiratory: Positive for cough and shortness of breath.   Cardiovascular: Negative for chest pain and leg swelling.  Gastrointestinal: Positive for abdominal pain, nausea and vomiting.  Genitourinary: Negative for dysuria and hematuria.  Musculoskeletal: Positive for joint pain and myalgias.  Skin: Negative for rash.  Neurological: Positive for weakness and headaches. Negative for focal weakness and loss of consciousness.  Psychiatric/Behavioral: Negative for memory loss and substance abuse.    Past Medical History:  Diagnosis Date  . Acute renal failure (HCC)     Intractable nausea vomiting secondary to diabetic gastroparesis causing dehydration and acute renal failure /notes 04/01/2013  . Anginal pain ([place])   . Anxiety   . Bell's palsy 08/09/2010  . Bicornuate uterus   . CAP (community acquired pneumonia) 10/2011   Archie Endo 11/08/2011  . Chest pain 01/13/2014  . CHF (congestive heart failure) ([place])   . Diabetic gastroparesis associated with type 2 diabetes mellitus ([place])    this is presumed diagnoses, not confirmed by any studies.   . Eczema   . Family history of malignant neoplasm of breast   . Family history of malignant neoplasm of ovary   . GERD (gastroesophageal reflux disease)   . High cholesterol   . Hypertension   . Liver lesion 08/13/2019  . Liver mass 2014   biopsied 03/2013 at [facility], not malignant.  is to undergo radiologic ablation of the mass in sept/October 2014.   . Lupus ([place])   . Migraines    "maybe a couple times/yr" (04/01/2013)  . Obesity   . Obstructive sleep apnea on CPAP 2011  Oxygen at nights  . SJOGREN'S SYNDROME 08/09/2010  . SLE (systemic lupus erythematosus) ([place])    Archie Endo 12/01/2010  . Stroke [facility]) 2010; 10/2012   "left side is still weak from it, never fully regained full strength; no additions from stroke 10/2012"    Past Surgical History:  Procedure Laterality Date  . BREAST CYST EXCISION Left 08/2005   epidermoid  . CARDIAC  CATHETERIZATION  02/07/12  . ECTOPIC PREGNANCY SURGERY  1999  . ECTOPIC PREGNANCY SURGERY  1999  . ESOPHAGOGASTRODUODENOSCOPY N/A 02/26/2013   Procedure: ESOPHAGOGASTRODUODENOSCOPY (EGD);  Surgeon: Irene Shipper, MD;  Location[facility] ENDOSCOPY;  Service: Endoscopy;  Laterality: N/A;  . ESOPHAGOGASTRODUODENOSCOPY N/A 03/02/2015   Procedure: ESOPHAGOGASTRODUODENOSCOPY (EGD);  Surgeon: Milus Banister, MD;  Location: [place];  Service: Endoscopy;  Laterality: N/A;  . HERNIA REPAIR    . LEFT AND RIGHT HEART CATHETERIZATION WITH CORONARY ANGIOGRAM N/A 02/07/2012   Procedure: LEFT AND RIGHT HEART CATHETERIZATION WITH CORONARY ANGIOGRAM;  Surgeon: Laverda Page, MD;  Location: [facility] CATH LAB;  Service: Cardiovascular;  Laterality: N/A;  . LIVER BIOPSY  03/2013   liver mass/medical hx noted above  . MUSCLE BIOPSY     for lupus/notes 12/01/2010  . UMBILICAL HERNIA REPAIR  1980's     reports that she quit smoking about 28 years ago. Her smoking use included cigarettes. She has a 1.50 pack-year smoking history. She has never used smokeless tobacco. She reports that she does not drink alcohol and does not use drugs.  Allergies  Allergen Reactions  . Metoclopramide Other (See Comments)    Developed restless leg, akathisia type limb movements.   . Codeine Itching, Rash and Other (See Comments)  . Penicillins Itching    Has patient had a PCN reaction causing immediate rash, facial/tongue/throat swelling, SOB or lightheadedness with hypotension: No Has patient had a PCN reaction causing severe rash involving mucus membranes or skin necrosis: No Has patient had a PCN reaction that required hospitalization: No Has patient had a PCN reaction occurring within the last 10 years:yes If all of the above answers are "NO", then may proceed with Cephalosporin use.  Marland Kitchen Hydrocodone Rash  . Percocet [Oxycodone-Acetaminophen] Hives and Rash    Tolerates dilaudid    Family History  Problem Relation Age of Onset  .  Diabetes Mother   . Asthma Mother   . Urticaria Mother   . Hypertension Mother   . Breast cancer Maternal Aunt 43       currently 36  . Stomach cancer Maternal Uncle        dx 37s; deceased  . Prostate cancer Maternal Uncle        deceased 23s  . Cancer Maternal Aunt        unk. primary  . Cancer Maternal Aunt        unk. primary  . Ovarian cancer Maternal Aunt        deceased 23s  . Hypertension Other   . Stroke Other   . Arthritis Other   . Ovarian cancer Sister 72       maternal half-sister; RAD51C positive  . Cancer Paternal Aunt        unk. primary; deceased 19s  . Prostate cancer Paternal Uncle        deceased 53s  . Breast cancer Cousin        deceased 7s; daughter of mat uncle with stomach ca  . Breast cancer Maternal Aunt        deceased  63s  . Asthma Brother   . Asthma Brother   . Allergic rhinitis Neg Hx   . Angioedema Neg Hx     Prior to Admission medications   Medication Sig Start Date End Date Taking? Authorizing Provider  Accu-Chek Softclix Lancets lancets Use to monitor glucose levels 4 times per day; E11.9 Patient taking differently: 1 each by Other route See admin instructions. Use to monitor glucose levels 4 times per day; E11.9 10/19/18  Yes Renato Shin, MD  albuterol [facility] HFA) 108 (90 BASE) MCG/ACT inhaler Inhale 2 puffs into the lungs every 6 (six) hours as needed for wheezing or shortness of breath.    Yes [provider]  albuterol (PROVENTIL) (2.5 MG/3ML) 0.083% nebulizer solution Take 2.5 mg by nebulization every 6 (six) hours as needed for wheezing.   Yes [provider]  ALPRAZolam Duanne Moron) 0.5 MG tablet Take 0.5 mg 2 (two) times daily as needed by mouth for anxiety or sleep.  04/19/17  Yes [provider]  amLODipine (NORVASC) 5 MG tablet Take 1 tablet (5 mg total) by mouth daily. 01/21/20  Yes Adrian Prows, MD  aspirin EC 81 MG tablet Take 81 mg by mouth daily. Swallow whole.   Yes [provider]    azaTHIOprine (IMURAN) 50 MG tablet Take 150 mg by mouth in the morning and at bedtime.    Yes [provider]  B-D UF III MINI PEN NEEDLES 31G X 5 MM MISC USE AS DIRECTED TWICE A DAY Patient taking differently: 1 each by Other route in the morning and at bedtime.  10/31/18  Yes Renato Shin, MD  Belimumab (BENLYSTA) 200 MG/ML SOAJ Inject 200 mg into the skin once a week. Tuesdays. 12/19/19  Yes [provider]  BIDIL 20-37.5 MG tablet TAKE TWO (2) TABLETS BY MOUTH THREE TIMES DAILY  Patient taking differently: Take 2 tablets by mouth 3 (three) times daily.  04/03/19  Yes Miquel Dunn, NP  buPROPion (WELLBUTRIN XL) 300 MG 24 hr tablet Take 300 mg by mouth daily. 06/28/19  Yes [provider]  carvedilol (COREG) 25 MG tablet Take 1 tablet (25 mg total) by mouth 2 (two) times daily with a meal. 01/21/20  Yes Adrian Prows, MD  cetaphil (CETAPHIL) lotion Apply topically 2 (two) times daily. Patient taking differently: Apply 1 application topically 2 (two) times daily. Dry skin 09/18/19  Yes Love, Ivan Anchors, PA-C  cetirizine (ZYRTEC) 10 MG tablet Take 10 mg by mouth daily as needed for allergies.    Yes [provider]  clotrimazole-betamethasone (LOTRISONE) cream Apply 1 application topically 2 (two) times daily. Patient taking differently: Apply 1 application topically daily as needed (rash).  01/01/19  Yes Renato Shin, MD  cycloSPORINE (RESTASIS) 0.05 % ophthalmic emulsion Place 1 drop into both eyes in the morning, at noon, and at bedtime.    Yes [provider]  diclofenac Sodium (VOLTAREN) 1 % GEL Apply 2 g topically 4 (four) times daily. Patient taking differently: Apply 2 g topically daily as needed (pain).  09/18/19  Yes Love, Ivan Anchors, PA-C  doxepin (SINEQUAN) 50 MG capsule Take 50-100 mg by mouth at bedtime as needed (insomnia).   Yes [provider]  EASY TOUCH PEN NEEDLES 32G X 4 MM MISC USE TO INJECT INSULIN TWICE DAILY Patient taking  differently: 1 each by Other route in the morning and at bedtime.  08/07/19  Yes Renato Shin, MD  etonogestrel-ethinyl estradiol (NUVARING) 0.12-0.015 MG/24HR vaginal ring Place 1 each  every 28 (twenty-eight) days vaginally. Insert vaginally and leave in place for 3 consecutive weeks, then remove for 1 week.   Yes [provider]  exenatide (BYETTA 10 MCG PEN) 10 MCG/0.04ML SOPN injection INJECT 0.04ML UNDER THE SKIN 2 TIMES DAILY WITH A MEAL Patient taking differently: Inject 10 mcg into the skin 2 (two) times daily with a meal.  11/18/19  Yes Renato Shin, MD  famotidine (PEPCID) 20 MG tablet Take 20 mg by mouth 2 (two) times daily.   Yes [provider]  fluticasone (FLONASE) 50 MCG/ACT nasal spray Place 2 sprays into both nostrils continuous as needed for allergies or rhinitis.   Yes [provider]  glucose blood (ACCU-CHEK AVIVA PLUS) test strip Use to check blood sugar 4 times daily. Patient taking differently: 1 each by Other route in the morning, at noon, in the evening, and at bedtime.  09/20/19  Yes Elayne Snare, MD  hydroxychloroquine (PLAQUENIL) 200 MG tablet Take 200 mg by mouth 2 (two) times daily.    Yes [provider]  insulin NPH Human (NOVOLIN N) 100 UNIT/ML injection Inject 170 Units into the skin in the morning.   Yes [provider]  Insulin Syringe-Needle U-100 (EASY TOUCH INSULIN SYRINGE) 31G X 5/16" 1 ML MISC USE AS DIRECTED THREE TIMES A DAY Patient taking differently: 1 each by Other route in the morning, at noon, and at bedtime.  10/12/18  Yes Renato Shin, MD  levothyroxine (SYNTHROID) 50 MCG tablet Take 1 tablet (50 mcg total) by mouth daily at 6 (six) AM. 08/30/19  Yes Sheikh, Omair Latif, DO  megestrol (MEGACE) 40 MG tablet Take 40 mg by mouth daily. 01/30/15  Yes [provider]  metFORMIN (GLUCOPHAGE-XR) 500 MG 24 hr tablet Take 1,000 mg by mouth 2 (two) times daily. 09/12/19  Yes [provider]    mometasone-formoterol (DULERA) 100-5 MCG/ACT AERO Inhale 2 puffs into the lungs 2 (two) times daily. 05/29/17  Yes [provider]  Multiple Vitamin (MULTIVITAMIN WITH MINERALS) TABS tablet Take 1 tablet by mouth daily. 08/30/19  Yes Sheikh, Omair Latif, DO  oxybutynin (DITROPAN-XL) 10 MG 24 hr tablet Take 15 mg by mouth daily.  08/30/17  Yes [provider]  pravastatin (PRAVACHOL) 10 MG tablet Take 10 mg by mouth daily.   Yes [provider]  predniSONE (DELTASONE) 1 MG tablet Take 5 mg by mouth daily.  04/21/17  Yes [provider]  pregabalin (LYRICA) 75 MG capsule Take 75 mg 3 (three) times daily by mouth.  01/28/15  Yes [provider]  sacubitril-valsartan (ENTRESTO) 97-103 MG Take 1 tablet by mouth 2 (two) times daily. 01/21/20  Yes Adrian Prows, MD  spironolactone (ALDACTONE) 50 MG tablet Take 1 tablet (50 mg total) by mouth daily. 01/21/20  Yes Adrian Prows, MD  sucralfate (CARAFATE) 1 GM/10ML suspension Take 1 g by mouth 2 (two) times daily. 07/08/19  Yes [provider]  topiramate (TOPAMAX) 25 MG tablet Take 75 mg by mouth at bedtime. 03/17/16  Yes [provider]  torsemide (DEMADEX) 20 MG tablet Take 20 mg by mouth 2 (two) times daily. 09/12/19  Yes [provider]  traMADol (ULTRAM) 50 MG tablet TAKE 1 TABLETS BY MOUTH EVERY 6 HOURS AS NEEDED FOR PAIN Patient taking differently: Take 50 mg by mouth every 6 (six) hours as needed for moderate pain or severe pain.  09/18/19  Yes Love, Ivan Anchors, PA-C  ascorbic acid (VITAMIN C) 500 MG tablet Take 1 tablet (  500 mg total) by mouth 2 (two) times daily. Patient not taking: Reported on 01/29/2020 09/19/19   Love, Ivan Anchors, PA-C  fluconazole (DIFLUCAN) 100 MG tablet Take 1 tablet (100 mg total) by mouth daily. Patient not taking: Reported on 01/29/2020 09/19/19   Love, Ivan Anchors, PA-C  hydrocortisone (ANUSOL-HC) 2.5 % rectal cream Place rectally 2 (two) times daily. Patient not taking:  Reported on 01/29/2020 09/19/19   Love, Ivan Anchors, PA-C  promethazine (PHENERGAN) 25 MG suppository Place 1 suppository (25 mg total) rectally every 6 (six) hours as needed for nausea or vomiting. Patient not taking: Reported on 01/29/2020 10/23/19   Drenda Freeze, MD    Physical Exam:  Constitutional:morbid obese female who appears acutely ill Vitals:   01/29/20 1429 01/29/20 1443 01/29/20 1507 01/29/20 1509  BP: (!) 181/98  (!) 156/86 (!) 156/86  Pulse:    (!) 108  Resp: (!) 24 19 (!) 23 (!) 24  Temp:    99.9 F (37.7 C)  TempSrc:    Oral  SpO2:    100%  Weight:      Height:       Eyes: PERRL, lids and conjunctivae normal ENMT: Mucous membranes are dry. Posterior pharynx clear of any exudate or lesions.  Neck: normal, supple, no masses, no thyromegaly Respiratory: clear to auscultation bilaterally, no wheezing, no crackles. Normal respiratory effort. No accessory muscle use.   Cardiovascular: Regular rate and rhythm, no murmurs / rubs / gallops. No extremity edema. 2+ pedal pulses. No carotid bruits.  Abdomen: Epigastric tenderness, no masses palpated. No hepatosplenomegaly. Bowel sounds positive.  Musculoskeletal: no clubbing / cyanosis. No joint deformity upper and lower extremities. Good ROM, no contractures. Normal muscle tone.  Skin: no rashes, lesions, ulcers. No induration Neurologic: CN 2-12 grossly intact. Sensation intact, DTR normal. Strength 5/5 in all 4.  Psychiatric: Normal judgment and insight. Alert and oriented x 3. Normal mood.     Labs on Admission: I have personally reviewed following labs and imaging studies  CBC: Recent Labs  Lab 01/28/20 2215  WBC 6.7  NEUTROABS 4.8  HGB 11.6*  HCT 36.7  MCV 87.8  PLT 883   Basic Metabolic Panel: Recent Labs  Lab 01/28/20 2215  NA 137  K 3.7  CL 107  CO2 19*  GLUCOSE 163*  BUN 9  CREATININE 0.98  CALCIUM 8.5*   GFR: Estimated Creatinine Clearance: 111.6 mL/min (by C-G formula based on SCr of 0.98  mg/dL). Liver Function Tests: Recent Labs  Lab 01/28/20 2215  AST 47*  ALT 14  ALKPHOS 77  BILITOT 0.5  PROT 6.6  ALBUMIN 2.8*   No results for input(s): LIPASE, AMYLASE in the last 168 hours. No results for input(s): AMMONIA in the last 168 hours. Coagulation Profile: No results for input(s): INR, PROTIME in the last 168 hours. Cardiac Enzymes: No results for input(s): CKTOTAL, CKMB, CKMBINDEX, TROPONINI in the last 168 hours. BNP (last 3 results) No results for input(s): PROBNP in the last 8760 hours. HbA1C: No results for input(s): HGBA1C in the last 72 hours. CBG: No results for input(s): GLUCAP in the last 168 hours. Lipid Profile: No results for input(s): CHOL, HDL, LDLCALC, TRIG, CHOLHDL, LDLDIRECT in the last 72 hours. Thyroid Function Tests: No results for input(s): TSH, T4TOTAL, FREET4, T3FREE, THYROIDAB in the last 72 hours. Anemia Panel: No results for input(s): VITAMINB12, FOLATE, FERRITIN, TIBC, IRON, RETICCTPCT in the last 72 hours. Urine analysis:    Component Value Date/Time   COLORURINE  AMBER (A) 01/29/2020 1406   APPEARANCEUR CLOUDY (A) 01/29/2020 1406   LABSPEC 1.035 (H) 01/29/2020 1406   PHURINE 5.0 01/29/2020 1406   GLUCOSEU NEGATIVE 01/29/2020 1406   HGBUR MODERATE (A) 01/29/2020 1406   BILIRUBINUR NEGATIVE 01/29/2020 1406   KETONESUR 5 (A) 01/29/2020 1406   PROTEINUR >=300 (A) 01/29/2020 1406   UROBILINOGEN 0.2 02/27/2015 1733   NITRITE NEGATIVE 01/29/2020 1406   LEUKOCYTESUR MODERATE (A) 01/29/2020 1406   Sepsis Labs: Recent Results (from the past 240 hour(s))  SARS Coronavirus 2 by RT PCR (hospital order, performed in [facility] hospital lab) Nasopharyngeal Nasopharyngeal Swab     Status: Abnormal   Collection Time: 01/29/20  9:56 AM   Specimen: Nasopharyngeal Swab  Result Value Ref Range Status   SARS Coronavirus 2 POSITIVE (A) NEGATIVE Final    Comment: RESULT CALLED TO, READ BACK BY AND VERIFIED WITH: RN LOUIE CHILTON [id number] 01/29/20  KB (NOTE) SARS-CoV-2 target nucleic acids are DETECTED  SARS-CoV-2 RNA is generally detectable in upper respiratory specimens  during the acute phase of infection.  Positive results are indicative  of the presence of the identified virus, but do not rule out bacterial infection or co-infection with other pathogens not detected by the test.  Clinical correlation with patient history and  other diagnostic information is necessary to determine patient infection status.  The expected result is negative.  Fact Sheet for Patients:   StrictlyIdeas.no   Fact Sheet for Healthcare Providers:   BankingDealers.co.za    This test is not yet approved or cleared by the [place] FDA and  has been authorized for detection and/or diagnosis of SARS-CoV-2 by FDA under an Emergency Use Authorization (EUA).  This EUA will remain in effect (meaning this tes t can be used) for the duration of  the COVID-19 declaration under Section 564(b)(1) of the Act, 21 U.S.C. section 360-bbb-3(b)(1), unless the authorization is terminated or revoked sooner.  Performed at [facility], [place] [address], [place], [place] [address]      Radiological Exams on Admission: CT ABDOMEN PELVIS W CONTRAST  Result Date: 01/29/2020 CLINICAL DATA:  Fever, body aches, and sore throat for the past 2 days. Lower abdominal pain. COVID-19 positive. EXAM: CT ABDOMEN AND PELVIS WITH CONTRAST TECHNIQUE: Multidetector CT imaging of the abdomen and pelvis was performed using the standard protocol following bolus administration of intravenous contrast. CONTRAST:  161m OMNIPAQUE IOHEXOL 300 MG/ML  SOLN COMPARISON:  CT abdomen pelvis dated October 23, 2019. FINDINGS: Lower chest: Small subpleural ground-glass densities at the lung bases with more focal consolidation versus atelectasis in the left lower lobe. Hepatobiliary: Heterogeneous hepatic parenchyma and ill-defined hypodense liver  lesions are less conspicuous when compared to the prior study due to different phase of contrast. No obvious significant interval change. No new focal liver abnormality. The gallbladder is unremarkable. No biliary dilatation. Pancreas: Unremarkable. No pancreatic ductal dilatation or surrounding inflammatory changes. Spleen: Normal in size without focal abnormality. Adrenals/Urinary Tract: Adrenal glands are unremarkable. Unchanged punctate calculus in the upper pole of the right kidney. No hydronephrosis. The bladder is unremarkable. Stomach/Bowel: Stomach is within normal limits. Appendix appears normal. No evidence of bowel wall thickening, distention, or inflammatory changes. Vascular/Lymphatic: No significant vascular findings are present. No enlarged abdominal or pelvic lymph nodes. Reproductive: Uterus and bilateral adnexa are unremarkable. Other: No abdominal wall hernia or abnormality. No abdominopelvic ascites. No pneumoperitoneum. Musculoskeletal: No acute or significant osseous findings. IMPRESSION: 1. No acute intra-abdominal process. 2. Small subpleural ground-glass  densities at the lung bases with more focal consolidation versus atelectasis in the left lower lobe. Findings are suspicious for COVID-19 pneumonia given clinical history. 3. Heterogeneous hepatic parenchyma and ill-defined hypodense liver lesions are less conspicuous when compared to the prior study due to different phase of contrast, but there is no obvious significant interval change. 4. Unchanged nonobstructive right nephrolithiasis. Electronically Signed   By: Titus Dubin M.D.   On: 01/29/2020 14:06   DG Chest Portable 1 View  Result Date: 01/29/2020 CLINICAL DATA:  Dyspnea, fever, nausea and vomiting. EXAM: PORTABLE CHEST 1 VIEW COMPARISON:  Single-view of the chest 06/24/2020 and 08/13/2019. CT chest 10/05/2019. FINDINGS: There is cardiomegaly and pulmonary vascular congestion. No consolidative process, pneumothorax or  effusion is identified. No acute or focal bony abnormality. IMPRESSION: Cardiomegaly and pulmonary vascular congestion. Electronically Signed   By: Inge Rise M.D.   On: 01/29/2020 09:13    EKG: Independently reviewed.  Sinus tachycardia 112 bpm  Assessment/Plan Sepsis secondary pneumonia due to COVID-19: Acute.  Patient presented febrile up to 103 F with tachycardia and tachypnea.  CT imaging of the abdomen and pelvis with abdominal pain with nausea and vomiting symptoms -Admit to medical telemetry bed -COVID-19 order set utilized -Continuous pulse oximetry with nasal cannula oxygen to maintain O2 saturation greater than 90% -Check blood and urine cultures -Albuterol inhaler -Decadron 6 mg daily -Remdesivir per pharmacy -Empiric antibiotics of Rocephin and azithromycin due to immunocompromise status consider de-escalating if procalcitonin and other findings do not suggest possibility of bacterial infection. -Vitamin C and zinc -Tylenol as needed for fever -Check inflammatory markers stat and monitor daily  Abnormal urinalysis: Acute.  Urinalysis was noted to be abnormal.  Patient denied any symptoms of dysuria or urinary frequency. -Check urine culture  Systolic CHF: Chronic.  Patient does not appear to be grossly fluid overloaded at this time.  Last EF noted to be 45 to 50% back in 2019. -Strict intake and output  Hypertensive urgency: Acute.  Systolic blood pressures initially elevated up into the 180s.  Home medications include amlodipine 5 mg daily, BiDil 2 tablets 3 times daily, Coreg 25 mg twice daily, Entresto twice daily, spironolactone 50 mg daily, and torsemide 20 mg daily. -Continue home regimen as tolerated  Diabetes mellitus type 2: On admission glucose elevated 163.  Last hemoglobin A1c noted to be 8.6 on 08/23/2019.  -Hypoglycemic protocol -Hold Metformin  -Continue Byetta 10 mcg twice daily -CBGs before every meal with moderate SSI -Consider restarting NPH 170  units every morning when patient tolerating p.o.  Systemic Lupus: Patient is on Imuran 150 mg twice daily, belimumab 200 mg q. weekly on Tuesday, and prednisone. -Continue Imuran  Anxiety: Home medications include Wellbutrin 300 mg daily and Xanax 0.5 mg as needed. -Continue current home regimen  Hypothyroidism: Home medication regimen includes levothyroxine 50 mcg daily. -Continue levothyroxine  Hyperlipidemia -Continue pravastatin  GERD: Home medications include Pepcid 20 mg twice daily and Carafate 1 g twice daily. -Continue home regimen  Morbid obesity: BMI 56.15 kg/m -Will need continued education on need weight loss  DVT prophylaxis: Lovenox Code Status: Full Family Communication: Sister updated over the phone Disposition Plan: Possible discharge home in 2 to 3 days Consults called: None Admission status: Inpatient  Norval Morton MD [facility] Pager [phone]   If 7PM-7AM, please contact night-coverage [email or web address] Password [email or web address]  01/29/2020, 3:22 PM

## 2020-01-29 NOTE — ED Notes (Signed)
Pt ambulated, spo2 dropped to 89%, pt c/o lightheadedness and visibly short of breath. Pt SPO2 up to 96% when pt sitting.

## 2020-01-29 NOTE — ED Provider Notes (Signed)
[facility] EMERGENCY DEPARTMENT Provider Note   CSN: [id number] Arrival date & time: 01/28/20  2203     History Chief Complaint  Patient presents with  . COVID like symptoms    [name] is a 49 y.o. female.  HPI   Patient is a 49 year old female with a significant medical history as noted below.  She states about 1 week ago she started experiencing general cold-like symptoms including congestion, diffuse headache, sneezing, rhinorrhea, fever, chills, body aches.  About 4 days ago she began experiencing intractable nausea and vomiting.  She most recently vomited here in the emergency department.  Nonbilious nonbloody.  She reports some associated moderate epigastric pain.  She additionally reports some central chest tightness.  She states her tightness only occurs when coughing.  She has diffuse joint pain that she states is typical for her lupus but has been worsening since the onset of her prior mentioned symptoms.  She states she has been taking her prescribed medications up until about 2 days ago, and has been unable to since then due to her N/V.  She denies any acute shortness of breath or LOC.   Patient had an echocardiogram scheduled for June 22.  It does not appear that the results were released.  Previous echocardiogram was on February 21 of 2019 and shows a LVEF of 30 to 35%.  Cardiologist: Dr. Adrian Prows  PCP: Precious Haws     Past Medical History:  Diagnosis Date  . Acute renal failure (HCC)     Intractable nausea vomiting secondary to diabetic gastroparesis causing dehydration and acute renal failure /notes 04/01/2013  . Anginal pain ([place])   . Anxiety   . Bell's palsy 08/09/2010  . Bicornuate uterus   . CAP (community acquired pneumonia) 10/2011   Archie Endo 11/08/2011  . Chest pain 01/13/2014  . CHF (congestive heart failure) ([place])   . Diabetic gastroparesis associated with type 2 diabetes mellitus ([place])    this is presumed diagnoses, not confirmed by any  studies.   . Eczema   . Family history of malignant neoplasm of breast   . Family history of malignant neoplasm of ovary   . GERD (gastroesophageal reflux disease)   . High cholesterol   . Hypertension   . Liver lesion 08/13/2019  . Liver mass 2014   biopsied 03/2013 at [facility], not malignant.  is to undergo radiologic ablation of the mass in sept/October 2014.   . Lupus ([place])   . Migraines    "maybe a couple times/yr" (04/01/2013)  . Obesity   . Obstructive sleep apnea on CPAP 2011   Oxygen at nights  . SJOGREN'S SYNDROME 08/09/2010  . SLE (systemic lupus erythematosus) ([place])    Archie Endo 12/01/2010  . Stroke [facility]) 2010; 10/2012   "left side is still weak from it, never fully regained full strength; no additions from stroke 10/2012"    Patient Active Problem List   Diagnosis Date Noted  . Pressure injury of skin 08/30/2019  . Physical debility 08/29/2019  . Obesity hypoventilation syndrome ([place]) 08/23/2019  . Skin breakdown 08/23/2019  . Essential hypertension 08/23/2019  . Liver hemorrhage 08/23/2019  . Acute blood loss anemia 08/13/2019  . Hepatic adenoma   . Hemoperitoneum   . Diabetic acidosis without coma ([place])   . Multifocal pneumonia 09/23/2017  . Acute respiratory failure ([place]) 09/21/2017  . CHF exacerbation ([place]) 09/20/2017  . Costochondritis 06/06/2017  . Chest pain 06/04/2017  . Diabetes ([place]) 09/19/2015  .  Radiculopathy of cervical spine   . Severe headache 03/05/2014  . Family history of malignant neoplasm of breast   . Family history of malignant neoplasm of ovary   . Intractable nausea and vomiting 11/11/2013  . Septate uterus 09/08/2013  . Diabetic gastroparesis ([place]) 04/28/2013  . Constipation due to pain medication 04/21/2013  . Odynophagia 04/04/2013  . Liver masses 02/28/2013  . Reflux esophagitis 02/26/2013  . Morbid obesity ([place]) 10/10/2012  . Chronic diastolic CHF (congestive heart failure) ([place]) 10/09/2012  . Chronic respiratory failure with  hypoxia ([place]) 10/09/2012  . Clinical polymyositis syndrome ([place]) 09/06/2012  . Mononeuritis of lower limb 01/12/2012  . HTN (hypertension) 11/08/2011  . Systolic CHF, acute on chronic ([place]) 11/08/2011  . HLD (hyperlipidemia) 07/19/2011  . Bell's palsy 08/09/2010  . SLE 08/09/2010  . [place] SYNDROME 08/09/2010    Past Surgical History:  Procedure Laterality Date  . BREAST CYST EXCISION Left 08/2005   epidermoid  . CARDIAC CATHETERIZATION  02/07/12  . ECTOPIC PREGNANCY SURGERY  1999  . ECTOPIC PREGNANCY SURGERY  1999  . ESOPHAGOGASTRODUODENOSCOPY N/A 02/26/2013   Procedure: ESOPHAGOGASTRODUODENOSCOPY (EGD);  Surgeon: Irene Shipper, MD;  Location: [facility] ENDOSCOPY;  Service: Endoscopy;  Laterality: N/A;  . ESOPHAGOGASTRODUODENOSCOPY N/A 03/02/2015   Procedure: ESOPHAGOGASTRODUODENOSCOPY (EGD);  Surgeon: Milus Banister, MD;  Location: [facility];  Service: Endoscopy;  Laterality: N/A;  . HERNIA REPAIR    . LEFT AND RIGHT HEART CATHETERIZATION WITH CORONARY ANGIOGRAM N/A 02/07/2012   Procedure: LEFT AND RIGHT HEART CATHETERIZATION WITH CORONARY ANGIOGRAM;  Surgeon: Laverda Page, MD;  Location: [facility] CATH LAB;  Service: Cardiovascular;  Laterality: N/A;  . LIVER BIOPSY  03/2013   liver mass/medical hx noted above  . MUSCLE BIOPSY     for lupus/notes 12/01/2010  . UMBILICAL HERNIA REPAIR  1980's     OB History    Gravida  2   Para  1   Term  1   Preterm      AB  1   Living  1     SAB      TAB      Ectopic  1   Multiple      Live Births  1           Family History  Problem Relation Age of Onset  . Diabetes Mother   . Asthma Mother   . Urticaria Mother   . Hypertension Mother   . Breast cancer Maternal Aunt 93       currently 38  . Stomach cancer Maternal Uncle        dx 53s; deceased  . Prostate cancer Maternal Uncle        deceased 88s  . Cancer Maternal Aunt        unk. primary  . Cancer Maternal Aunt        unk. primary  . Ovarian cancer Maternal Aunt          deceased 56s  . Hypertension Other   . Stroke Other   . Arthritis Other   . Ovarian cancer Sister 34       maternal half-sister; RAD51C positive  . Cancer Paternal Aunt        unk. primary; deceased 50s  . Prostate cancer Paternal Uncle        deceased 30s  . Breast cancer Cousin        deceased 36s; daughter of mat uncle with stomach ca  . Breast cancer Maternal Aunt  deceased 54s  . Asthma Brother   . Asthma Brother   . Allergic rhinitis Neg Hx   . Angioedema Neg Hx     Social History   Tobacco Use  . Smoking status: Former Smoker    Packs/day: 0.50    Years: 3.00    Pack years: 1.50    Types: Cigarettes    Quit date: 06/02/1991    Years since quitting: 28.6  . Smokeless tobacco: Never Used  . Tobacco comment: quit 28 yrs ago  Vaping Use  . Vaping Use: Never used  Substance Use Topics  . Alcohol use: No    Alcohol/week: 0.0 standard drinks  . Drug use: No    Home Medications Prior to Admission medications   Medication Sig Start Date End Date Taking? Authorizing Provider  Accu-Chek Softclix Lancets lancets Use to monitor glucose levels 4 times per day; E11.9 10/19/18   Renato Shin, MD  albuterol (PROAIR HFA) 108 (90 BASE) MCG/ACT inhaler Inhale 2 puffs into the lungs every 6 (six) hours as needed for wheezing or shortness of breath.     [provider]  albuterol (PROVENTIL) (2.5 MG/3ML) 0.083% nebulizer solution Take 2.5 mg by nebulization every 6 (six) hours as needed for wheezing.    [provider]  ALPRAZolam Duanne Moron) 0.5 MG tablet Take 0.5 mg 2 (two) times daily as needed by mouth for anxiety or sleep.  04/19/17   [provider]  amLODipine (NORVASC) 5 MG tablet Take 1 tablet (5 mg total) by mouth daily. 01/21/20   Adrian Prows, MD  ascorbic acid (VITAMIN C) 500 MG tablet Take 1 tablet (500 mg total) by mouth 2 (two) times daily. 09/19/19   Love, Ivan Anchors, PA-C  aspirin 81 MG chewable tablet Chew 81 mg by mouth daily.     [provider]  azaTHIOprine (IMURAN) 50 MG tablet Take 50 mg by mouth daily. Two tablets twice daily    [provider]  B-D UF III MINI PEN NEEDLES 31G X 5 MM MISC USE AS DIRECTED TWICE A DAY 10/31/18   Renato Shin, MD  Belimumab (BENLYSTA) 200 MG/ML SOAJ Inject into the skin. 12/19/19   [provider]  BIDIL 20-37.5 MG tablet TAKE TWO (2) TABLETS BY MOUTH THREE TIMES DAILY  Patient taking differently: Take 2 tablets by mouth 3 (three) times daily.  04/03/19   Miquel Dunn, NP  buPROPion (WELLBUTRIN XL) 300 MG 24 hr tablet Take 300 mg by mouth daily. 06/28/19   [provider]  carvedilol (COREG) 25 MG tablet Take 1 tablet (25 mg total) by mouth 2 (two) times daily with a meal. 01/21/20   Adrian Prows, MD  cetaphil (CETAPHIL) lotion Apply topically 2 (two) times daily. 09/18/19   Love, Ivan Anchors, PA-C  cetirizine (ZYRTEC) 10 MG tablet Take 10 mg by mouth daily as needed for allergies.     [provider]  clotrimazole-betamethasone (LOTRISONE) cream Apply 1 application topically 2 (two) times daily. 01/01/19   Renato Shin, MD  cycloSPORINE (RESTASIS) 0.05 % ophthalmic emulsion Place 1 drop into both eyes in the morning, at noon, and at bedtime.     [provider]  diclofenac Sodium (VOLTAREN) 1 % GEL Apply 2 g topically 4 (four) times daily. 09/18/19   Love, Ivan Anchors, PA-C  EASY TOUCH PEN NEEDLES 32G X 4 MM MISC USE TO INJECT INSULIN TWICE DAILY 08/07/19   Renato Shin, MD  etonogestrel-ethinyl estradiol (NUVARING) 0.12-0.015 MG/24HR vaginal ring Place 1  each every 28 (twenty-eight) days vaginally. Insert vaginally and leave in place for 3 consecutive weeks, then remove for 1 week.    [provider]  exenatide (BYETTA 10 MCG PEN) 10 MCG/0.04ML SOPN injection INJECT 0.04ML UNDER THE SKIN 2 TIMES DAILY WITH A MEAL 11/18/19   Renato Shin, MD  famotidine (PEPCID) 20 MG tablet Take 20 mg by mouth 2 (two) times daily.    [provider]  fluconazole (DIFLUCAN) 100 MG tablet Take 1 tablet (100 mg total) by mouth daily. 09/19/19   Love, Ivan Anchors, PA-C  fluticasone (FLONASE) 50 MCG/ACT nasal spray Place 2 sprays into both nostrils continuous as needed for allergies or rhinitis.    [provider]  glucose blood (ACCU-CHEK AVIVA PLUS) test strip Use to check blood sugar 4 times daily. 09/20/19   Elayne Snare, MD  hydrocortisone (ANUSOL-HC) 2.5 % rectal cream Place rectally 2 (two) times daily. 09/19/19   Love, Ivan Anchors, PA-C  hydroxychloroquine (PLAQUENIL) 200 MG tablet Take 200 mg by mouth 2 (two) times daily.     [provider]  insulin NPH Human (NOVOLIN N) 100 UNIT/ML injection Inject 170 Units into the skin daily before breakfast.    [provider]  Insulin Syringe-Needle U-100 (EASY TOUCH INSULIN SYRINGE) 31G X 5/16" 1 ML MISC USE AS DIRECTED THREE TIMES A DAY 10/12/18   Renato Shin, MD  levothyroxine (SYNTHROID) 50 MCG tablet Take 1 tablet (50 mcg total) by mouth daily at 6 (six) AM. 08/30/19   Raiford Noble Latif, DO  megestrol (MEGACE) 40 MG tablet Take 40 mg by mouth daily. 01/30/15   [provider]  metFORMIN (GLUCOPHAGE-XR) 500 MG 24 hr tablet Take 1,000 mg by mouth 2 (two) times daily. 09/12/19   [provider]  mometasone-formoterol (DULERA) 100-5 MCG/ACT AERO Inhale 2 puffs into the lungs 2 (two) times daily. 05/29/17   [provider]  Multiple Vitamin (MULTIVITAMIN WITH MINERALS) TABS tablet Take 1 tablet by mouth daily. 08/30/19   Raiford Noble Latif, DO  oxybutynin (DITROPAN-XL) 10 MG 24 hr tablet Take 20 mg by mouth daily.  08/30/17   [provider]  pravastatin (PRAVACHOL) 10 MG tablet Take 10 mg by mouth daily.    [provider]  predniSONE (DELTASONE) 1 MG tablet Take 5 mg by mouth daily.  04/21/17   [provider]  pregabalin (LYRICA) 75 MG capsule Take 75 mg 3 (three) times daily by mouth.  01/28/15   [provider]  promethazine (PHENERGAN) 25 MG suppository Place 1 suppository (25 mg total) rectally every 6 (six) hours as needed for nausea or vomiting. 10/23/19   Drenda Freeze, MD  sacubitril-valsartan (ENTRESTO) 97-103 MG Take 1 tablet by mouth 2 (two) times daily. 01/21/20   Adrian Prows, MD  spironolactone (ALDACTONE) 50 MG tablet Take 1 tablet (50 mg total) by mouth daily. 01/21/20   Adrian Prows, MD  sucralfate (CARAFATE) 1 GM/10ML suspension Take 1 g by mouth 2 (two) times daily. 07/08/19   [provider]  topiramate (TOPAMAX) 25 MG tablet Take 75 mg by mouth at bedtime. 03/17/16   [provider]  torsemide (DEMADEX) 20 MG tablet Take 20 mg by mouth 2 (two) times daily. 09/12/19   [provider]  traMADol (ULTRAM) 50 MG tablet TAKE 1 TABLETS BY MOUTH EVERY 6 HOURS AS NEEDED FOR PAIN 09/18/19   Love, Ivan Anchors, PA-C    Allergies    Metoclopramide, Codeine, Penicillins, Hydrocodone, and Percocet [  oxycodone-acetaminophen]  Review of Systems   Review of Systems  All other systems reviewed and are negative. Ten systems reviewed and are negative for acute change, except as noted in the HPI.   Physical Exam Updated Vital Signs BP (!) 142/62 (BP Location: Left Arm)   Pulse 61   Temp 99.4 F (37.4 C) (Oral)   Resp (!) 24   Ht 5' 9"  (1.753 m)   Wt (!) 155.1 kg   LMP 03/24/2017   SpO2 99%   BMI 50.49 kg/m   Physical Exam Vitals and nursing note reviewed.  Constitutional:      General: She is in acute distress.     Appearance: Normal appearance. She is obese. She is ill-appearing. She is not toxic-appearing or diaphoretic.  HENT:     Head: Normocephalic and atraumatic.     Right Ear: External ear normal.     Left Ear: External ear normal.     Nose: Nose normal.     Mouth/Throat:     Mouth: Mucous membranes are moist.     Pharynx: Oropharynx is clear. No oropharyngeal exudate or posterior oropharyngeal erythema.  Eyes:     Extraocular Movements:  Extraocular movements intact.  Cardiovascular:     Rate and Rhythm: Regular rhythm. Tachycardia present.     Pulses: Normal pulses.     Heart sounds: Normal heart sounds. No murmur heard.  No friction rub. No gallop.      Comments: Tachycardic around 105 while I am in the room. Pulmonary:     Effort: Pulmonary effort is normal. No respiratory distress.     Breath sounds: Normal breath sounds. No stridor. No wheezing, rhonchi or rales.     Comments: Lungs are clear to auscultation bilaterally.  Patient is saturating between 97 and 100% on room air while I am in the room.  She is speaking in clear and complete sentences. Abdominal:     Palpations: Abdomen is soft.     Tenderness: There is abdominal tenderness.     Comments: Extremely difficult to assess abdomen secondary to body habitus.  TTP noted in the epigastrium.  Musculoskeletal:        General: Normal range of motion.     Cervical back: Normal range of motion and neck supple. No tenderness.     Comments: Possible trace edema noted in the lower extremities.  Again, difficult to assess secondary to body habitus.  Skin:    General: Skin is warm and dry.  Neurological:     General: No focal deficit present.     Mental Status: She is alert and oriented to person, place, and time.  Psychiatric:        Mood and Affect: Mood normal.        Behavior: Behavior normal.    ED Results / Procedures / Treatments   Labs (all labs ordered are listed, but only abnormal results are displayed) Labs Reviewed  SARS CORONAVIRUS 2 BY RT PCR (HOSPITAL ORDER, [facility]) - Abnormal; Notable for the following components:      Result Value   SARS Coronavirus 2 POSITIVE (*)    All other components within normal limits  CBC WITH DIFFERENTIAL/PLATELET - Abnormal; Notable for the following components:   Hemoglobin 11.6 (*)    All other components within normal limits  COMPREHENSIVE METABOLIC PANEL - Abnormal; Notable for the  following components:   CO2 19 (*)    Glucose, Bld 163 (*)    Calcium  8.5 (*)    Albumin 2.8 (*)    AST 47 (*)    All other components within normal limits  URINALYSIS, ROUTINE W REFLEX MICROSCOPIC - Abnormal; Notable for the following components:   Color, Urine AMBER (*)    APPearance CLOUDY (*)    Specific Gravity, Urine 1.035 (*)    Hgb urine dipstick MODERATE (*)    Ketones, ur 5 (*)    Protein, ur >=300 (*)    Leukocytes,Ua MODERATE (*)    WBC, UA >50 (*)    Bacteria, UA MANY (*)    Non Squamous Epithelial 0-5 (*)    All other components within normal limits  LACTIC ACID, PLASMA  I-STAT BETA HCG BLOOD, ED ([facility], [facility], [facility] ONLY)  TROPONIN I (HIGH SENSITIVITY)   EKG EKG Interpretation  Date/Time:  Tuesday January 28 2020 22:06:21 EDT Ventricular Rate:  112 PR Interval:  124 QRS Duration: 82 QT Interval:  290 QTC Calculation: 395 R Axis:   -3 Text Interpretation: Sinus tachycardia Septal infarct , age undetermined Possible Lateral infarct , age undetermined Abnormal ECG similar to Mar 2021 Confirmed by Sherwood Gambler [phone]) on 01/29/2020 10:15:20 AM   Radiology CT ABDOMEN PELVIS W CONTRAST  Result Date: 01/29/2020 CLINICAL DATA:  Fever, body aches, and sore throat for the past 2 days. Lower abdominal pain. COVID-19 positive. EXAM: CT ABDOMEN AND PELVIS WITH CONTRAST TECHNIQUE: Multidetector CT imaging of the abdomen and pelvis was performed using the standard protocol following bolus administration of intravenous contrast. CONTRAST:  166m OMNIPAQUE IOHEXOL 300 MG/ML  SOLN COMPARISON:  CT abdomen pelvis dated October 23, 2019. FINDINGS: Lower chest: Small subpleural ground-glass densities at the lung bases with more focal consolidation versus atelectasis in the left lower lobe. Hepatobiliary: Heterogeneous hepatic parenchyma and ill-defined hypodense liver lesions are less conspicuous when compared to the prior study due to different phase of contrast. No obvious significant interval  change. No new focal liver abnormality. The gallbladder is unremarkable. No biliary dilatation. Pancreas: Unremarkable. No pancreatic ductal dilatation or surrounding inflammatory changes. Spleen: Normal in size without focal abnormality. Adrenals/Urinary Tract: Adrenal glands are unremarkable. Unchanged punctate calculus in the upper pole of the right kidney. No hydronephrosis. The bladder is unremarkable. Stomach/Bowel: Stomach is within normal limits. Appendix appears normal. No evidence of bowel wall thickening, distention, or inflammatory changes. Vascular/Lymphatic: No significant vascular findings are present. No enlarged abdominal or pelvic lymph nodes. Reproductive: Uterus and bilateral adnexa are unremarkable. Other: No abdominal wall hernia or abnormality. No abdominopelvic ascites. No pneumoperitoneum. Musculoskeletal: No acute or significant osseous findings. IMPRESSION: 1. No acute intra-abdominal process. 2. Small subpleural ground-glass densities at the lung bases with more focal consolidation versus atelectasis in the left lower lobe. Findings are suspicious for COVID-19 pneumonia given clinical history. 3. Heterogeneous hepatic parenchyma and ill-defined hypodense liver lesions are less conspicuous when compared to the prior study due to different phase of contrast, but there is no obvious significant interval change. 4. Unchanged nonobstructive right nephrolithiasis. Electronically Signed   By: WTitus DubinM.D.   On: 01/29/2020 14:06   DG Chest Portable 1 View  Result Date: 01/29/2020 CLINICAL DATA:  Dyspnea, fever, nausea and vomiting. EXAM: PORTABLE CHEST 1 VIEW COMPARISON:  Single-view of the chest 06/24/2020 and 08/13/2019. CT chest 10/05/2019. FINDINGS: There is cardiomegaly and pulmonary vascular congestion. No consolidative process, pneumothorax or effusion is identified. No acute or focal bony abnormality. IMPRESSION: Cardiomegaly and pulmonary vascular congestion. Electronically  Signed   By: TInge Rise  M.D.   On: 01/29/2020 09:13   Procedures Procedures (including critical care time)  Medications Ordered in ED Medications  ibuprofen (ADVIL) tablet 600 mg (600 mg Oral Given 01/28/20 2233)  acetaminophen (TYLENOL) tablet 650 mg (650 mg Oral Given 01/29/20 0137)  alum & mag hydroxide-simeth (MAALOX/MYLANTA) 200-200-20 MG/5ML suspension 30 mL (30 mLs Oral Given 01/29/20 0947)    And  lidocaine (XYLOCAINE) 2 % viscous mouth solution 15 mL (15 mLs Oral Given 01/29/20 0947)  pantoprazole (PROTONIX) injection 40 mg (40 mg Intravenous Given 01/29/20 0947)  HYDROmorphone (DILAUDID) injection 0.5 mg (0.5 mg Intravenous Given 01/29/20 1125)  iohexol (OMNIPAQUE) 300 MG/ML solution 100 mL (100 mLs Intravenous Contrast Given 01/29/20 1352)   ED Course  I have reviewed the triage vital signs and the nursing notes.  Pertinent labs & imaging results that were available during my care of the patient were reviewed by me and considered in my medical decision making (see chart for details).  Clinical Course as of Jan 29 1451  Wed Jan 29, 2020  0848 Slight increase over the last few months.  AST(!): 47 [LJ]  0848 Slightly improved compared to baseline. Possible increase 2/2 hemoconcentration.   Hemoglobin(!): 11.6 [LJ]  0856 Given APAP and ibuprofen in triage, which improved temperature to 99.4.  Temp(!): 103 F (39.4 C) [LJ]  0934 Cardiomegaly and pulmonary vascular congestion  DG Chest Portable 1 View [LJ]  1032 Patient reports minimal relief of her abdominal pain with Protonix and GI cocktail.  Will give a dose of Dilaudid.  Will obtain a CT scan of the abdomen and pelvis.   [LJ]  1141 SARS Coronavirus 2(!): POSITIVE [LJ]  1141 Saturating well on room air  SpO2: 100 % [LJ]  1450 CT scan of the abdomen and pelvis shows no acute intra-abdominal process.  There are small subpleural groundglass densities in the lung bases with more focal consolidation in the left lower lobe.   Consistent with her diagnosis of COVID-19.  CT ABDOMEN PELVIS W CONTRAST [LJ]  2376 Patient has been tachycardic throughout her stay.  MAP (mmHg): 117 [LJ]    Clinical Course User Index [LJ] Rayna Sexton, PA-C   MDM Rules/Calculators/A&P                          Patient is a 49 year old female with a significant medical history.  Please refer to her history, physical exam, ED clinical course as noted above.  Patient was ambulated by nurse technician.  She has been saturating around 95 to 97% during her stay on room air but desaturated to about 89% when ambulating.  Patient states she felt lightheaded as though "she was going to pass out".  I discussed the option of monoclonal antibody therapy prior to discharge.  She states that she lives alone and is unsure that she will be able to manage her health as well as her current symptoms on an outpatient basis.   We will discuss with hospitalist team for possible admission.  Note: Portions of this report may have been transcribed using voice recognition software. Every effort was made to ensure accuracy; however, inadvertent computerized transcription errors may be present.    Final Clinical Impression(s) / ED Diagnoses Final diagnoses:  COVID-19  Pneumonia due to COVID-19 virus   Rx / DC Orders ED Discharge Orders    None       Rayna Sexton, PA-C 01/29/20 1532    Sherwood Gambler, MD 02/01/20 1104

## 2020-01-29 NOTE — ED Notes (Signed)
Report given to Rushie Chestnut, RN.

## 2020-01-30 DIAGNOSIS — I5022 Chronic systolic (congestive) heart failure: Secondary | ICD-10-CM

## 2020-01-30 DIAGNOSIS — U071 COVID-19: Secondary | ICD-10-CM

## 2020-01-30 DIAGNOSIS — Z794 Long term (current) use of insulin: Secondary | ICD-10-CM

## 2020-01-30 DIAGNOSIS — J1282 Pneumonia due to coronavirus disease 2019: Secondary | ICD-10-CM

## 2020-01-30 DIAGNOSIS — E1143 Type 2 diabetes mellitus with diabetic autonomic (poly)neuropathy: Secondary | ICD-10-CM

## 2020-01-30 LAB — CBC WITH DIFFERENTIAL/PLATELET
Abs Immature Granulocytes: 0.02 10*3/uL (ref 0.00–0.07)
Basophils Absolute: 0 10*3/uL (ref 0.0–0.1)
Basophils Relative: 0 %
Eosinophils Absolute: 0 10*3/uL (ref 0.0–0.5)
Eosinophils Relative: 0 %
HCT: 35 % — ABNORMAL LOW (ref 36.0–46.0)
Hemoglobin: 11.2 g/dL — ABNORMAL LOW (ref 12.0–15.0)
Immature Granulocytes: 0 %
Lymphocytes Relative: 15 %
Lymphs Abs: 0.7 10*3/uL (ref 0.7–4.0)
MCH: 28 pg (ref 26.0–34.0)
MCHC: 32 g/dL (ref 30.0–36.0)
MCV: 87.5 fL (ref 80.0–100.0)
Monocytes Absolute: 0.1 10*3/uL (ref 0.1–1.0)
Monocytes Relative: 3 %
Neutro Abs: 3.8 10*3/uL (ref 1.7–7.7)
Neutrophils Relative %: 82 %
Platelets: 198 10*3/uL (ref 150–400)
RBC: 4 MIL/uL (ref 3.87–5.11)
RDW: 13.2 % (ref 11.5–15.5)
WBC: 4.8 10*3/uL (ref 4.0–10.5)
nRBC: 0 % (ref 0.0–0.2)

## 2020-01-30 LAB — COMPREHENSIVE METABOLIC PANEL
ALT: 16 U/L (ref 0–44)
AST: 60 U/L — ABNORMAL HIGH (ref 15–41)
Albumin: 2.7 g/dL — ABNORMAL LOW (ref 3.5–5.0)
Alkaline Phosphatase: 70 U/L (ref 38–126)
Anion gap: 10 (ref 5–15)
BUN: 15 mg/dL (ref 6–20)
CO2: 18 mmol/L — ABNORMAL LOW (ref 22–32)
Calcium: 8.3 mg/dL — ABNORMAL LOW (ref 8.9–10.3)
Chloride: 107 mmol/L (ref 98–111)
Creatinine, Ser: 1.26 mg/dL — ABNORMAL HIGH (ref 0.44–1.00)
GFR calc Af Amer: 58 mL/min — ABNORMAL LOW (ref >60)
GFR calc non Af Amer: 50 mL/min — ABNORMAL LOW (ref >60)
Glucose, Bld: 258 mg/dL — ABNORMAL HIGH (ref 70–99)
Potassium: 4.3 mmol/L (ref 3.5–5.1)
Sodium: 135 mmol/L (ref 135–145)
Total Bilirubin: 0.5 mg/dL (ref 0.3–1.2)
Total Protein: 6.5 g/dL (ref 6.5–8.1)

## 2020-01-30 LAB — HEMOGLOBIN A1C
Hgb A1c MFr Bld: 10.5 % — ABNORMAL HIGH (ref 4.8–5.6)
Mean Plasma Glucose: 254.65 mg/dL

## 2020-01-30 LAB — MAGNESIUM: Magnesium: 1.8 mg/dL (ref 1.7–2.4)

## 2020-01-30 LAB — C-REACTIVE PROTEIN: CRP: 7.2 mg/dL — ABNORMAL HIGH (ref <1.0)

## 2020-01-30 LAB — GLUCOSE, CAPILLARY
Glucose-Capillary: 252 mg/dL — ABNORMAL HIGH (ref 70–99)
Glucose-Capillary: 272 mg/dL — ABNORMAL HIGH (ref 70–99)
Glucose-Capillary: 231 mg/dL — ABNORMAL HIGH (ref 70–99)
Glucose-Capillary: 358 mg/dL — ABNORMAL HIGH (ref 70–99)

## 2020-01-30 LAB — URINE CULTURE: Culture: >=100000 — AB

## 2020-01-30 LAB — D-DIMER, QUANTITATIVE: D-Dimer, Quant: 1.83 ug/mL-FEU — ABNORMAL HIGH (ref 0.00–0.50)

## 2020-01-30 LAB — FERRITIN: Ferritin: 427 ng/mL — ABNORMAL HIGH (ref 11–307)

## 2020-01-30 MED ORDER — INSULIN ASPART 100 UNIT/ML ~~LOC~~ SOLN
0.0000 [IU] | Freq: Every day | SUBCUTANEOUS | Status: DC
  Administered 2020-01-30 – 2020-01-31 (×2): 5 [IU] via SUBCUTANEOUS
  Administered 2020-02-01 – 2020-02-02 (×2): 4 [IU] via SUBCUTANEOUS
  Administered 2020-02-03 – 2020-02-04 (×2): 3 [IU] via SUBCUTANEOUS
  Administered 2020-02-05: 4 [IU] via SUBCUTANEOUS
  Administered 2020-02-06: 3 [IU] via SUBCUTANEOUS

## 2020-01-30 MED ORDER — SODIUM CHLORIDE 0.9 % IV SOLN: INTRAVENOUS | Status: AC

## 2020-01-30 MED ORDER — VANCOMYCIN HCL 1250 MG/250ML IV SOLN
1250.0000 mg | Freq: Two times a day (BID) | INTRAVENOUS | Status: DC
  Administered 2020-01-31 – 2020-02-07 (×15): 1250 mg via INTRAVENOUS
  Filled 2020-01-30 (×17): qty 250

## 2020-01-30 MED ORDER — PANTOPRAZOLE SODIUM 40 MG PO TBEC
40.0000 mg | DELAYED_RELEASE_TABLET | Freq: Every day | ORAL | Status: DC
  Administered 2020-01-30 – 2020-02-07 (×9): 40 mg via ORAL
  Filled 2020-01-30 (×9): qty 1

## 2020-01-30 MED ORDER — PROMETHAZINE HCL 25 MG/ML IJ SOLN
25.0000 mg | Freq: Once | INTRAMUSCULAR | Status: AC
  Administered 2020-01-30: 25 mg via INTRAVENOUS
  Filled 2020-01-30: qty 1

## 2020-01-30 MED ORDER — ONDANSETRON HCL 4 MG/2ML IJ SOLN
4.0000 mg | Freq: Once | INTRAMUSCULAR | Status: AC
  Administered 2020-01-30: 4 mg via INTRAVENOUS

## 2020-01-30 MED ORDER — INSULIN GLARGINE 100 UNIT/ML ~~LOC~~ SOLN
15.0000 [IU] | Freq: Every day | SUBCUTANEOUS | Status: DC
  Administered 2020-01-30: 15 [IU] via SUBCUTANEOUS
  Filled 2020-01-30 (×2): qty 0.15

## 2020-01-30 MED ORDER — PROMETHAZINE HCL 25 MG/ML IJ SOLN
12.5000 mg | Freq: Four times a day (QID) | INTRAMUSCULAR | Status: DC | PRN
  Administered 2020-01-30 – 2020-02-04 (×7): 12.5 mg via INTRAVENOUS
  Filled 2020-01-30 (×7): qty 1

## 2020-01-30 MED ORDER — VANCOMYCIN HCL 10 G IV SOLR
2500.0000 mg | Freq: Once | INTRAVENOUS | Status: AC
  Administered 2020-01-31: 2500 mg via INTRAVENOUS
  Filled 2020-01-30: qty 2500

## 2020-01-30 NOTE — Plan of Care (Signed)
  Problem: Clinical Measurements: Goal: Respiratory complications will improve Outcome: Progressing   

## 2020-01-30 NOTE — Progress Notes (Signed)
PHARMACY - PHYSICIAN COMMUNICATION CRITICAL VALUE ALERT - BLOOD CULTURE IDENTIFICATION (BCID)  [name] is an 49 y.o. female who presented to [facility] on 01/28/2020 with a chief complaint of COVID-19.   Name of physician (or Provider) Contacted: [facility]  Current antibiotics: ceftriaxone + azithromycin  Changes to prescribed antibiotics recommended:  Recommendations accepted by provider >> add vancomycin with GPRs in both urine and blood in chronically immunocompromised patient   BCID = 1 of 4 cultures with gram positive rods (unidentified organism)   Arrie Senate, PharmD, BCPS Clinical Pharmacist [phone] Please check AMION for all [place] numbers 01/30/2020

## 2020-01-30 NOTE — Progress Notes (Signed)
Inpatient Diabetes Program Recommendations  AACE/ADA: New Consensus Statement on Inpatient Glycemic Control   Target Ranges:  Prepandial:   less than 140 mg/dL      Peak postprandial:   less than 180 mg/dL (1-2 hours)      Critically ill patients:  140 - 180 mg/dL   Results for [name], [name] (MRN [id number]) as of 01/30/2020 11:20  Ref. Range 01/29/2020 17:43 01/29/2020 20:58 01/30/2020 07:44  Glucose-Capillary Latest Ref Range: 70 - 99 mg/dL 219 (H) 204 (H) 252 (H)   Review of Glycemic Control  Diabetes history: DM2 Outpatient Diabetes medications: NPH 170 units QAM, Byetta 10 mcg BID, Metformin XR 1000 mg BID Current orders for Inpatient glycemic control: Novolog 0-15 units TID with meals; Decadron 6 mg daily  Inpatient Diabetes Program Recommendations:   Insulin - Basal: Please consider ordering Lantus 15 units Q24H starting now.  Correction (SSI): Please consider ordering Novolog 0-5 units QHS for bedtime correction.  Insulin - Meal Coverage: Once patient is tolerating PO intake, may need to consider ordering meal coverage insulin.  NOTE: In reviewing the chart, noted patient sees Dr. Loanne Drilling (Endocrinologist) for outpatient DM management. Spoke with patient over the phone regarding DM management. Patient confirms that she is prescribed NPH 170 units QAM, Byetta 10 mcg BID with meals, and Metformin XR 1000 mg BID for DM control. Patient states that since she has been so nauseous and vomiting she has not taken any DM medications since last Thursday 01/23/20. Patient states that she has been experiencing nausea and vomiting for several months. Discussed side effects of Metformin (nausea, diarrhea, GI upset) and Byetta (nausea, vomiting, upset stomach, diarrhea) and encouraged patient to talk with Dr. Loanne Drilling and/or PCP regarding whether DM medications needed to be changed and/or adjusted. Patient states she is still feeling very poorly and nauseated and does not feel she can eat yet. Informed  patient that since she is ordered Decadron, glucose is likely to be more elevated with steroids. Informed patient that it would be requested that MD order basal insulin and once she is eating she may need meal coverage insulin. Patient verbalized understanding of information discussed and has no questions at this time.  Thanks, Barnie Alderman, RN, MSN, CDE Diabetes Coordinator Inpatient Diabetes Program [phone] (Team Pager from 8am to 5pm)

## 2020-01-30 NOTE — Progress Notes (Signed)
Pharmacy Antibiotic Note  [name] is a 49 y.o. female admitted on 01/28/2020 with SOB and positive for COVID-19. Pt growing diphtheroids in urine, now with gram positive rods in 1 of 4 blood cultures (currently unidentified). Pharmacy has been consulted for vancomycin dosing given that pt is chronically immunosuppressed. Cr up this morning, pt afebrile.  Plan: -Vancomycin 2500mg  IV x1 then 1250mg  IV q12h -Check vancomycin trough at [facility] -F/U speciation of cultures   Height: 5\' 8"  (172.7 cm) Weight: (!) 166.7 kg (367 lb 9.6 oz) IBW/kg (Calculated) : 63.9  Temp (24hrs), Avg:99.6 F (37.6 C), Min:98.9 F (37.2 C), Max:100.5 F (38.1 C)  Recent Labs  Lab 01/28/20 2215 01/29/20 0950 01/30/20 0013  WBC 6.7  --  4.8  CREATININE 0.98  --  1.26*  LATICACIDVEN  --  1.4  --     Estimated Creatinine Clearance: 89.5 mL/min (A) (by C-G formula based on SCr of 1.26 mg/dL (H)).    Allergies  Allergen Reactions  . Metoclopramide Other (See Comments)    Developed restless leg, akathisia type limb movements.   . Codeine Itching, Rash and Other (See Comments)  . Penicillins Itching    Has patient had a PCN reaction causing immediate rash, facial/tongue/throat swelling, SOB or lightheadedness with hypotension: No Has patient had a PCN reaction causing severe rash involving mucus membranes or skin necrosis: No Has patient had a PCN reaction that required hospitalization: No Has patient had a PCN reaction occurring within the last 10 years:yes If all of the above answers are "NO", then may proceed with Cephalosporin use.  Marland Kitchen Hydrocodone Rash  . Percocet [Oxycodone-Acetaminophen] Hives and Rash    Tolerates dilaudid    Antimicrobials this admission: Remdesivir 6/30>>7/4 Azithro 6/30 >> Rocephin 6/30 >> Vancomycin 7/1 >>  Microbiology results: 6/30 BCx: 1o4 GP rods 6/30 UCx: >100k diphtheroids  Thank you for allowing pharmacy to be a part of this patient's care.   Arrie Senate, PharmD, BCPS Clinical Pharmacist [phone] Please check AMION for all [place] numbers 01/30/2020

## 2020-01-30 NOTE — Progress Notes (Signed)
PROGRESS NOTE                                                                                                                                                                                                             Patient Demographics:    [name], is a 49 y.o. female, DOB - 27-Mar-1971, ZOX:[id number]  Admit date - 01/28/2020   Admitting Physician Norval Morton, MD  Outpatient Primary MD for the patient is Bartholome Bill, MD  LOS - 1   Chief Complaint  Patient presents with  . COVID like symptoms       Brief Narrative   HPI: [name] is a 49 y.o. female with medical history significant of systolic CHF, lupus, diabetes mellitus type 2 with gastroparesis, and morbid obesity presents with complaints of shortness of breath and nausea over the last 5 days.  Reports having associated symptoms of subjective fever, chills, cough, congestion, chest tightness, headache, generalized body aches, joint pain, epigastric abdominal pain, and vomiting.  Emesis has been nonbloody in appearance.  Denies having any diarrhea, dysuria, or urinary frequency.  Since symptoms started she has been unable to take any of her medications due to her nausea and vomiting she is followed by Dr. Einar Gip of cardiology in the outpatient setting.  Last EF noted to be around 45 - 50% by echocardiogram from 05/2018.  Patient reports that she did not get her Covid vaccine as her doctors want sure if she should take it.  ED Course: Upon admission into the hospital patient was noted to be febrile up to 103 F, heart rates elevated up to 118, respiration 15-24, blood pressures 137/91-188/88, and O2 saturations currently maintained on on room air.  With ambulation patient's O2 saturations dropped to 89%.  Labs revealed hemoglobin 11.6 and glucose 163.  CT scan of the abdomen pelvis revealed signs of a multifocal pneumonia concerning for Covid.  Covid 19 screening was positive.   Patient had been given Dilaudid, GI cocktail, Zofran, and Protonix.  TRH called to admit.    Subjective:    [name] today reports significant nausea, vomiting, cough, generalized weakness and fatigue .  Assessment  & Plan :    Principal Problem:   Pneumonia due to COVID-19 virus Active Problems:   SLE   HTN (hypertension)   Chronic systolic  CHF (congestive heart failure) ([place])   Morbid obesity ([place])   Diabetes ([place])   Sepsis secondary pneumonia due to COVID-19: -  Acute.  Patient presented febrile up to 103 F with tachycardia and tachypnea.   -Remains with no oxygen requirement this morning. -Remains with significant cough, dyspnea with minimal exertion, continue with IV remdesivir and steroids. -Empiric antibiotics of Rocephin and azithromycin due to immunocompromise status consider de-escalating if procalcitonin and other findings do not suggest possibility of bacterial infection. -Vitamin C and zinc -Tylenol as needed for fever -Check inflammatory markers stat and monitor daily COVID-19 Labs  Recent Labs    01/29/20 1915 01/30/20 0013  DDIMER 2.02* 1.83*  FERRITIN 403* 427*  LDH 244*  --   CRP 8.0* 7.2*    Lab Results  Component Value Date   SARSCOV2NAA POSITIVE (A) 01/29/2020   SARSCOV2NAA NEGATIVE 10/23/2019   [place] NEGATIVE 08/24/2019   [place] NEGATIVE 08/12/2019     Abnormal urinalysis:  -  Urinalysis was noted to be abnormal.  Patient denied any symptoms of dysuria or urinary frequency. -Check urine culture  Chronic systolic CHF: Chronic.   - Patient does not appear to be grossly fluid overloaded at this time.  Last EF noted to be 45 to 50% back in 2019.  Initially with her nausea and vomiting, I will hold on any diuresis. -Strict intake and output  Hypertension -Home medications include amlodipine 5 mg daily, BiDil 2 tablets 3 times daily, Coreg 25 mg twice daily, Entresto twice daily, spironolactone 50 mg daily, and torsemide 20 mg  daily.  Blood pressure on the lower side today, as well she appears dry given her significant nausea and vomiting with worsening creatinine, so I will discontinue her torsemide, Aldactone and Entresto.  Diabetes mellitus type 2:  -On admission glucose elevated 163.  Last hemoglobin A1c noted to be 8.6 on 08/23/2019.  -Hypoglycemic protocol -Hold Metformin  -Continue Byetta 10 mcg twice daily -CBGs before every meal with moderate SSI -CBGs uncontrolled, will start on Lantus 15 units, and insulin sliding scale.  Systemic Lupus: Patient is on Imuran 150 mg twice daily, belimumab 200 mg q. weekly on Tuesday, and prednisone. -Continue Imuran  Anxiety: Home medications include Wellbutrin 300 mg daily and Xanax 0.5 mg as needed. -Continue current home regimen  Hypothyroidism: Home medication regimen includes levothyroxine 50 mcg daily. -Continue levothyroxine  Hyperlipidemia -Continue pravastatin  GERD: Home medications include Pepcid 20 mg twice daily and Carafate 1 g twice daily. -Continue home regimen  Morbid obesity: BMI 56.15 kg/m -Will need continued education on need weight loss   COVID-19 Labs  Recent Labs    01/29/20 1915 01/30/20 0013  DDIMER 2.02* 1.83*  FERRITIN 403* 427*  LDH 244*  --   CRP 8.0* 7.2*    Lab Results  Component Value Date   SARSCOV2NAA POSITIVE (A) 01/29/2020   [place] NEGATIVE 10/23/2019   [place] NEGATIVE 08/24/2019   [place] NEGATIVE 08/12/2019     Code Status : Full  Family Communication  : D/W patient  Disposition Plan  :  Status is: Inpatient  Remains inpatient appropriate because:IV treatments appropriate due to intensity of illness or inability to take PO   Dispo: The patient is from: Home              Anticipated d/c is to: Home              Anticipated d/c date is: > 3 days  Patient currently is not medically stable to d/c.        Barriers For Discharge :   Consults  :   none  Procedures  : None  DVT Prophylaxis  :  [place] lovenox  Lab Results  Component Value Date   PLT 198 01/30/2020    Antibiotics  :    Anti-infectives (From admission, onward)   Start     Dose/Rate Route Frequency Ordered Stop   01/30/20 1000  remdesivir 100 mg in sodium chloride 0.9 % 100 mL IVPB     Discontinue    "Followed by" Linked Group Details   100 mg 200 mL/hr over 30 Minutes Intravenous Daily 01/29/20 1546 02/03/20 0959   01/29/20 2200  hydroxychloroquine (PLAQUENIL) tablet 200 mg     Discontinue     200 mg Oral 2 times daily 01/29/20 1546     01/29/20 2000  cefTRIAXone (ROCEPHIN) 2 g in sodium chloride 0.9 % 100 mL IVPB     Discontinue     2 g 200 mL/hr over 30 Minutes Intravenous Every 24 hours 01/29/20 1850     01/29/20 2000  azithromycin (ZITHROMAX) 500 mg in sodium chloride 0.9 % 250 mL IVPB     Discontinue     500 mg 250 mL/hr over 60 Minutes Intravenous Every 24 hours 01/29/20 1850     01/29/20 1545  remdesivir 200 mg in sodium chloride 0.9% 250 mL IVPB       "Followed by" Linked Group Details   200 mg 580 mL/hr over 30 Minutes Intravenous Once 01/29/20 1546 01/29/20 1856        Objective:   Vitals:   01/30/20 0401 01/30/20 0410 01/30/20 0500 01/30/20 0741  BP: 120/71   123/84  Pulse: (!) 105 (!) 102  (!) 103  Resp: (!) 25 18  19   Temp: 99.4 F (37.4 C)   99.1 F (37.3 C)  TempSrc: Oral   Oral  SpO2: 92% 93%  92%  Weight:   (!) 166.7 kg   Height:        Wt Readings from Last 3 Encounters:  01/30/20 (!) 166.7 kg  01/21/20 (!) 155.1 kg  10/23/19 (!) 160 kg     Intake/Output Summary (Last 24 hours) at 01/30/2020 1246 Last data filed at 01/30/2020 0929 Gross per 24 hour  Intake 945.36 ml  Output 850 ml  Net 95.36 ml     Physical Exam  Awake Alert, Oriented X 3, No new F.N deficits, Normal affect, in  discomfort secondary to cough, nausea and vomiting  Symmetrical Chest wall movement, Good air movement bilaterally, CTAB RRR,No Gallops,Rubs  or new Murmurs, No Parasternal Heave +ve B.Sounds, Abd Soft, No tenderness,No rebound - guarding or rigidity. No Cyanosis, Clubbing or edema, No new Rash or bruise     Data Review:    CBC Recent Labs  Lab 01/28/20 2215 01/30/20 0013  WBC 6.7 4.8  HGB 11.6* 11.2*  HCT 36.7 35.0*  PLT 215 198  MCV 87.8 87.5  MCH 27.8 28.0  MCHC 31.6 32.0  RDW 13.2 13.2  LYMPHSABS 1.3 0.7  MONOABS 0.5 0.1  EOSABS 0.0 0.0  BASOSABS 0.0 0.0    Chemistries  Recent Labs  Lab 01/28/20 2215 01/30/20 0013  NA 137 135  K 3.7 4.3  CL 107 107  CO2 19* 18*  GLUCOSE 163* 258*  BUN 9 15  CREATININE 0.98 1.26*  CALCIUM 8.5* 8.3*  MG  --  1.8  AST 47* 60*  ALT 14 16  ALKPHOS 77 70  BILITOT 0.5 0.5   ------------------------------------------------------------------------------------------------------------------ No results for input(s): CHOL, HDL, LDLCALC, TRIG, CHOLHDL, LDLDIRECT in the last 72 hours.  Lab Results  Component Value Date   HGBA1C 8.6 (H) 08/23/2019   ------------------------------------------------------------------------------------------------------------------ No results for input(s): TSH, T4TOTAL, T3FREE, THYROIDAB in the last 72 hours.  Invalid input(s): FREET3 ------------------------------------------------------------------------------------------------------------------ Recent Labs    01/29/20 1915 01/30/20 0013  FERRITIN 403* 427*    Coagulation profile No results for input(s): INR, PROTIME in the last 168 hours.  Recent Labs    01/29/20 1915 01/30/20 0013  DDIMER 2.02* 1.83*    Cardiac Enzymes No results for input(s): CKMB, TROPONINI, MYOGLOBIN in the last 168 hours.  Invalid input(s): CK ------------------------------------------------------------------------------------------------------------------    Component Value Date/Time   BNP 35.8 01/29/2020 1915    Inpatient Medications  Scheduled Meds: . albuterol  2 puff Inhalation Q6H  .  amLODipine  5 mg Oral Daily  . vitamin C  500 mg Oral Daily  . azaTHIOprine  150 mg Oral BID  . buPROPion  300 mg Oral Daily  . carvedilol  25 mg Oral BID WC  . cetaphil  1 application Topical BID  . cycloSPORINE  1 drop Both Eyes BID  . dexamethasone  6 mg Oral Daily  . enoxaparin (LOVENOX) injection  75 mg Subcutaneous Q24H  . famotidine  20 mg Oral BID  . hydroxychloroquine  200 mg Oral BID  . insulin aspart  0-15 Units Subcutaneous TID WC  . isosorbide-hydrALAZINE  2 tablet Oral TID  . levothyroxine  50 mcg Oral Q0600  . loratadine  10 mg Oral Daily  . mometasone-formoterol  2 puff Inhalation BID  . pantoprazole  40 mg Oral Daily  . pravastatin  10 mg Oral Daily  . pregabalin  75 mg Oral TID  . sacubitril-valsartan  1 tablet Oral BID  . sodium chloride flush  3 mL Intravenous Q12H  . spironolactone  50 mg Oral Daily  . sucralfate  1 g Oral BID  . torsemide  20 mg Oral BID  . zinc sulfate  220 mg Oral Daily   Continuous Infusions: . sodium chloride Stopped (01/29/20 2205)  . azithromycin Stopped (01/29/20 2310)  . cefTRIAXone (ROCEPHIN)  IV Stopped (01/29/20 2202)  . remdesivir 100 mg in NS 100 mL 100 mg (01/30/20 1012)   PRN Meds:.sodium chloride, acetaminophen, ALPRAZolam, diclofenac Sodium, doxepin, fluticasone, guaiFENesin-dextromethorphan, HYDROmorphone (DILAUDID) injection, ondansetron **OR** ondansetron (ZOFRAN) IV, promethazine, traMADol  Micro Results Recent Results (from the past 240 hour(s))  SARS Coronavirus 2 by RT PCR (hospital order, performed in [place] hospital lab) Nasopharyngeal Nasopharyngeal Swab     Status: Abnormal   Collection Time: 01/29/20  9:56 AM   Specimen: Nasopharyngeal Swab  Result Value Ref Range Status   SARS Coronavirus 2 POSITIVE (A) NEGATIVE Final    Comment: RESULT CALLED TO, READ BACK BY AND VERIFIED WITH: RN LOUIE CHILTON [id number] 01/29/20 KB (NOTE) SARS-CoV-2 target nucleic acids are DETECTED  SARS-CoV-2 RNA is generally  detectable in upper respiratory specimens  during the acute phase of infection.  Positive results are indicative  of the presence of the identified virus, but do not rule out bacterial infection or co-infection with other pathogens not detected by the test.  Clinical correlation with patient history and  other diagnostic information is necessary to determine patient infection status.  The expected result is negative.  Fact Sheet for Patients:   StrictlyIdeas.no   Fact Sheet for Healthcare  Providers:   BankingDealers.co.za    This test is not yet approved or cleared by the [place] and  has been authorized for detection and/or diagnosis of SARS-CoV-2 by FDA under an Emergency Use Authorization (EUA).  This EUA will remain in effect (meaning this tes t can be used) for the duration of  the COVID-19 declaration under Section 564(b)(1) of the Act, 21 U.S.C. section 360-bbb-3(b)(1), unless the authorization is terminated or revoked sooner.  Performed at [facility], [place] [address] [place], [place] [address]   Culture, blood (Routine X 2) w Reflex to ID Panel     Status: None (Preliminary result)   Collection Time: 01/29/20  6:57 PM   Specimen: BLOOD  Result Value Ref Range Status   Specimen Description BLOOD RIGHT ANTECUBITAL  Final   Special Requests   Final    BOTTLES DRAWN AEROBIC AND ANAEROBIC Blood Culture adequate volume   Culture   Final    NO GROWTH < 12 HOURS Performed at [facility], [place] [address] [place], [place] [address]    Report Status PENDING  Incomplete  Culture, blood (Routine X 2) w Reflex to ID Panel     Status: None (Preliminary result)   Collection Time: 01/29/20  7:05 PM   Specimen: BLOOD LEFT HAND  Result Value Ref Range Status   Specimen Description BLOOD LEFT HAND  Final   Special Requests   Final    BOTTLES DRAWN AEROBIC AND ANAEROBIC Blood Culture adequate volume   Culture    Final    NO GROWTH < 12 HOURS Performed at [facility], [place] [address] [place], [place] [address]    Report Status PENDING  Incomplete    Radiology Reports CT ABDOMEN PELVIS W CONTRAST  Result Date: 01/29/2020 CLINICAL DATA:  Fever, body aches, and sore throat for the past 2 days. Lower abdominal pain. COVID-19 positive. EXAM: CT ABDOMEN AND PELVIS WITH CONTRAST TECHNIQUE: Multidetector CT imaging of the abdomen and pelvis was performed using the standard protocol following bolus administration of intravenous contrast. CONTRAST:  137mL OMNIPAQUE IOHEXOL 300 MG/ML  SOLN COMPARISON:  CT abdomen pelvis dated October 23, 2019. FINDINGS: Lower chest: Small subpleural ground-glass densities at the lung bases with more focal consolidation versus atelectasis in the left lower lobe. Hepatobiliary: Heterogeneous hepatic parenchyma and ill-defined hypodense liver lesions are less conspicuous when compared to the prior study due to different phase of contrast. No obvious significant interval change. No new focal liver abnormality. The gallbladder is unremarkable. No biliary dilatation. Pancreas: Unremarkable. No pancreatic ductal dilatation or surrounding inflammatory changes. Spleen: Normal in size without focal abnormality. Adrenals/Urinary Tract: Adrenal glands are unremarkable. Unchanged punctate calculus in the upper pole of the right kidney. No hydronephrosis. The bladder is unremarkable. Stomach/Bowel: Stomach is within normal limits. Appendix appears normal. No evidence of bowel wall thickening, distention, or inflammatory changes. Vascular/Lymphatic: No significant vascular findings are present. No enlarged abdominal or pelvic lymph nodes. Reproductive: Uterus and bilateral adnexa are unremarkable. Other: No abdominal wall hernia or abnormality. No abdominopelvic ascites. No pneumoperitoneum. Musculoskeletal: No acute or significant osseous findings. IMPRESSION: 1. No acute intra-abdominal process. 2.  Small subpleural ground-glass densities at the lung bases with more focal consolidation versus atelectasis in the left lower lobe. Findings are suspicious for COVID-19 pneumonia given clinical history. 3. Heterogeneous hepatic parenchyma and ill-defined hypodense liver lesions are less conspicuous when compared to the prior study due to different phase of contrast, but there  is no obvious significant interval change. 4. Unchanged nonobstructive right nephrolithiasis. Electronically Signed   By: Titus Dubin M.D.   On: 01/29/2020 14:06   DG Chest Portable 1 View  Result Date: 01/29/2020 CLINICAL DATA:  Dyspnea, fever, nausea and vomiting. EXAM: PORTABLE CHEST 1 VIEW COMPARISON:  Single-view of the chest 06/24/2020 and 08/13/2019. CT chest 10/05/2019. FINDINGS: There is cardiomegaly and pulmonary vascular congestion. No consolidative process, pneumothorax or effusion is identified. No acute or focal bony abnormality. IMPRESSION: Cardiomegaly and pulmonary vascular congestion. Electronically Signed   By: Inge Rise M.D.   On: 01/29/2020 09:13     Phillips Climes M.D on 01/30/2020 at 12:46 PM    [facility] -  Office  [phone]

## 2020-01-31 DIAGNOSIS — R7881 Bacteremia: Secondary | ICD-10-CM

## 2020-01-31 LAB — PROCALCITONIN: Procalcitonin: <0.1 ng/mL

## 2020-01-31 LAB — CBC WITH DIFFERENTIAL/PLATELET
Abs Immature Granulocytes: 0.02 10*3/uL (ref 0.00–0.07)
Basophils Absolute: 0 10*3/uL (ref 0.0–0.1)
Basophils Relative: 0 %
Eosinophils Absolute: 0 10*3/uL (ref 0.0–0.5)
Eosinophils Relative: 0 %
HCT: 33.6 % — ABNORMAL LOW (ref 36.0–46.0)
Hemoglobin: 10.7 g/dL — ABNORMAL LOW (ref 12.0–15.0)
Immature Granulocytes: 1 %
Lymphocytes Relative: 32 %
Lymphs Abs: 0.9 10*3/uL (ref 0.7–4.0)
MCH: 28.2 pg (ref 26.0–34.0)
MCHC: 31.8 g/dL (ref 30.0–36.0)
MCV: 88.4 fL (ref 80.0–100.0)
Monocytes Absolute: 0.3 10*3/uL (ref 0.1–1.0)
Monocytes Relative: 12 %
Neutro Abs: 1.5 10*3/uL — ABNORMAL LOW (ref 1.7–7.7)
Neutrophils Relative %: 55 %
Platelets: 201 10*3/uL (ref 150–400)
RBC: 3.8 MIL/uL — ABNORMAL LOW (ref 3.87–5.11)
RDW: 13.2 % (ref 11.5–15.5)
WBC: 2.8 10*3/uL — ABNORMAL LOW (ref 4.0–10.5)
nRBC: 0 % (ref 0.0–0.2)

## 2020-01-31 LAB — COMPREHENSIVE METABOLIC PANEL
ALT: 15 U/L (ref 0–44)
AST: 27 U/L (ref 15–41)
Albumin: 2.6 g/dL — ABNORMAL LOW (ref 3.5–5.0)
Alkaline Phosphatase: 70 U/L (ref 38–126)
Anion gap: 9 (ref 5–15)
BUN: 25 mg/dL — ABNORMAL HIGH (ref 6–20)
CO2: 19 mmol/L — ABNORMAL LOW (ref 22–32)
Calcium: 8.3 mg/dL — ABNORMAL LOW (ref 8.9–10.3)
Chloride: 108 mmol/L (ref 98–111)
Creatinine, Ser: 1.68 mg/dL — ABNORMAL HIGH (ref 0.44–1.00)
GFR calc Af Amer: 41 mL/min — ABNORMAL LOW (ref >60)
GFR calc non Af Amer: 35 mL/min — ABNORMAL LOW (ref >60)
Glucose, Bld: 374 mg/dL — ABNORMAL HIGH (ref 70–99)
Potassium: 4.7 mmol/L (ref 3.5–5.1)
Sodium: 136 mmol/L (ref 135–145)
Total Bilirubin: 0.5 mg/dL (ref 0.3–1.2)
Total Protein: 6.1 g/dL — ABNORMAL LOW (ref 6.5–8.1)

## 2020-01-31 LAB — C-REACTIVE PROTEIN: CRP: 4.5 mg/dL — ABNORMAL HIGH (ref <1.0)

## 2020-01-31 LAB — GLUCOSE, CAPILLARY
Glucose-Capillary: 308 mg/dL — ABNORMAL HIGH (ref 70–99)
Glucose-Capillary: 318 mg/dL — ABNORMAL HIGH (ref 70–99)
Glucose-Capillary: 319 mg/dL — ABNORMAL HIGH (ref 70–99)
Glucose-Capillary: 376 mg/dL — ABNORMAL HIGH (ref 70–99)

## 2020-01-31 LAB — FERRITIN: Ferritin: 536 ng/mL — ABNORMAL HIGH (ref 11–307)

## 2020-01-31 LAB — MAGNESIUM: Magnesium: 1.9 mg/dL (ref 1.7–2.4)

## 2020-01-31 LAB — D-DIMER, QUANTITATIVE: D-Dimer, Quant: 1.24 ug/mL-FEU — ABNORMAL HIGH (ref 0.00–0.50)

## 2020-01-31 MED ORDER — SODIUM CHLORIDE 0.9 % IV SOLN: INTRAVENOUS | Status: AC

## 2020-01-31 MED ORDER — GLUCERNA SHAKE PO LIQD
237.0000 mL | Freq: Three times a day (TID) | ORAL | Status: DC
  Administered 2020-01-31 – 2020-02-07 (×14): 237 mL via ORAL
  Filled 2020-01-31 (×5): qty 237

## 2020-01-31 MED ORDER — INSULIN GLARGINE 100 UNIT/ML ~~LOC~~ SOLN
20.0000 [IU] | Freq: Once | SUBCUTANEOUS | Status: AC
  Administered 2020-01-31: 20 [IU] via SUBCUTANEOUS
  Filled 2020-01-31: qty 0.2

## 2020-01-31 MED ORDER — INSULIN GLARGINE 100 UNIT/ML ~~LOC~~ SOLN
50.0000 [IU] | Freq: Every day | SUBCUTANEOUS | Status: DC
  Filled 2020-01-31: qty 0.5

## 2020-01-31 MED ORDER — OXYCODONE HCL 5 MG PO TABS
5.0000 mg | ORAL_TABLET | Freq: Four times a day (QID) | ORAL | Status: DC | PRN
  Administered 2020-01-31 – 2020-02-01 (×3): 5 mg via ORAL
  Filled 2020-01-31 (×5): qty 1

## 2020-01-31 MED ORDER — SODIUM CHLORIDE 0.9 % IV SOLN
3.0000 g | Freq: Four times a day (QID) | INTRAVENOUS | Status: DC
  Administered 2020-01-31 – 2020-02-01 (×4): 3 g via INTRAVENOUS
  Filled 2020-01-31 (×2): qty 8
  Filled 2020-01-31: qty 3
  Filled 2020-01-31 (×3): qty 8

## 2020-01-31 MED ORDER — INSULIN GLARGINE 100 UNIT/ML ~~LOC~~ SOLN
30.0000 [IU] | Freq: Every day | SUBCUTANEOUS | Status: DC
  Administered 2020-01-31: 30 [IU] via SUBCUTANEOUS
  Filled 2020-01-31: qty 0.3

## 2020-01-31 MED ORDER — INSULIN ASPART 100 UNIT/ML ~~LOC~~ SOLN
6.0000 [IU] | Freq: Three times a day (TID) | SUBCUTANEOUS | Status: DC
  Administered 2020-01-31 – 2020-02-02 (×6): 6 [IU] via SUBCUTANEOUS

## 2020-01-31 NOTE — Progress Notes (Signed)
PROGRESS NOTE                                                                                                                                                                                                             Patient Demographics:    [name], is a 49 y.o. female, DOB - 1971-01-26, UDJ:[id number]  Admit date - 01/28/2020   Admitting Physician Norval Morton, MD  Outpatient Primary MD for the patient is Bartholome Bill, MD  LOS - 2   Chief Complaint  Patient presents with  . COVID like symptoms       Brief Narrative   HPI: [name] is a 50 y.o. female with medical history significant of systolic CHF, lupus, diabetes mellitus type 2 with gastroparesis, and morbid obesity presents with complaints of shortness of breath and nausea over the last 5 days.  Reports having associated symptoms of subjective fever, chills, cough, congestion, chest tightness, headache, generalized body aches, joint pain, epigastric abdominal pain, and vomiting.  Emesis has been nonbloody in appearance.  Denies having any diarrhea, dysuria, or urinary frequency.  Since symptoms started she has been unable to take any of her medications due to her nausea and vomiting she is followed by Dr. Einar Gip of cardiology in the outpatient setting.  Last EF noted to be around 45 - 50% by echocardiogram from 05/2018.  Patient reports that she did not get her Covid vaccine as her doctors want sure if she should take it.  ED Course: Upon admission into the hospital patient was noted to be febrile up to 103 F, heart rates elevated up to 118, respiration 15-24, blood pressures 137/91-188/88, and O2 saturations currently maintained on on room air.  With ambulation patient's O2 saturations dropped to 89%.  Labs revealed hemoglobin 11.6 and glucose 163.  CT scan of the abdomen pelvis revealed signs of a multifocal pneumonia concerning for Covid.  Covid 19 screening was positive.   Patient had been given Dilaudid, GI cocktail, Zofran, and Protonix.  TRH called to admit.    Subjective:    [name] today reports her nausea and vomiting has improved, but still complains of significant cough, and abdominal/chest pain due to cough and vomiting.   Assessment  & Plan :    Principal Problem:   Pneumonia due to COVID-19 virus Active  Problems:   SLE   HTN (hypertension)   Chronic systolic CHF (congestive heart failure) ([place])   Morbid obesity ([place])   Diabetes ([place])   Sepsis secondary pneumonia due to COVID-19: -  Acute.  Patient presented febrile up to 103 F with tachycardia and tachypnea.   -Remains with no oxygen requirement this morning. -Remains with significant cough, dyspnea with minimal exertion, continue with IV remdesivir and steroids. -Empiric antibiotics of Rocephin and azithromycin due to immunocompromise status consider de-escalating if procalcitonin and other findings do not suggest possibility of bacterial infection. -Vitamin C and zinc -Tylenol as needed for fever -Check inflammatory markers stat and monitor daily COVID-19 Labs  Recent Labs    01/29/20 1915 01/30/20 0013 01/31/20 0325  DDIMER 2.02* 1.83* 1.24*  FERRITIN 403* 427* 536*  LDH 244*  --   --   CRP 8.0* 7.2* 4.5*    Lab Results  Component Value Date   SARSCOV2NAA POSITIVE (A) 01/29/2020   [place] NEGATIVE 10/23/2019   [place] NEGATIVE 08/24/2019   [place] NEGATIVE 08/12/2019     UTI -She does report polyuria, urine culture growing Corynebacterium which is not typical organism, but she is already covered on vancomycin and Unasyn for her bacteremia.  Bacteremia -Blood culture growing gram-negative rods, it is in two different sets, so this is likely a real infection, discussed with ID, given his immunocompromise for her treatment regimen due to lupus, will go ahead and start on Unasyn cover empirically for Listeria.  Chronic systolic CHF: Chronic.   -  Patient does not appear to be grossly fluid overloaded at this time.  Last EF noted to be 45 to 50% back in 2019.  Initially with her nausea and vomiting, I will hold on any diuresis. -Strict intake and output  Hypertension -Home medications include amlodipine 5 mg daily, BiDil 2 tablets 3 times daily, Coreg 25 mg twice daily, Entresto twice daily, spironolactone 50 mg daily, and torsemide 20 mg daily.   -Blood pressure is low, so all this regimen has been stopped .  AKI  -Likely volume depletion, and low blood pressure from nausea, vomiting and home medications , started on IV fluid, hold nephrotoxic medications .   Diabetes mellitus type 2:  -On admission glucose elevated 163.  A1c this admission is 10.5, increase her Lantus to 50 units subcu daily continue with insulin sliding scale, will add 6 units of NovoLog before meals. -Hypoglycemic protocol -Hold Metformin  -Continue [place] 10 mcg twice daily -CBGs before every meal with moderate SSI  Systemic Lupus: Patient is on Imuran 150 mg twice daily, belimumab 200 mg q. weekly on Tuesday, and prednisone. -Continue Imuran  Anxiety: Home medications include Wellbutrin 300 mg daily and Xanax 0.5 mg as needed. -Continue current home regimen  Hypothyroidism: Home medication regimen includes levothyroxine 50 mcg daily. -Continue levothyroxine  Hyperlipidemia -Continue pravastatin  GERD: Home medications include Pepcid 20 mg twice daily and Carafate 1 g twice daily. -Continue home regimen  Morbid obesity: BMI 56.15 kg/m -Will need continued education on need weight loss   COVID-19 Labs  Recent Labs    01/29/20 1915 01/30/20 0013 01/31/20 0325  DDIMER 2.02* 1.83* 1.24*  FERRITIN 403* 427* 536*  LDH 244*  --   --   CRP 8.0* 7.2* 4.5*    Lab Results  Component Value Date   SARSCOV2NAA POSITIVE (A) 01/29/2020   [place] NEGATIVE 10/23/2019   [place] NEGATIVE 08/24/2019   [place] NEGATIVE 08/12/2019      Code Status :  Full  Family Communication  : D/W patient  Disposition Plan  :  Status is: Inpatient  Remains inpatient appropriate because:IV treatments appropriate due to intensity of illness or inability to take PO   Dispo: The patient is from: Home              Anticipated d/c is to: Home              Anticipated d/c date is: > 3 days              Patient currently is not medically stable to d/c.        Barriers For Discharge :   Consults  :  none  Procedures  : None  DVT Prophylaxis  :  [place] lovenox  Lab Results  Component Value Date   PLT 201 01/31/2020    Antibiotics  :    Anti-infectives (From admission, onward)   Start     Dose/Rate Route Frequency Ordered Stop   01/31/20 1400  Ampicillin-Sulbactam (UNASYN) 3 g in sodium chloride 0.9 % 100 mL IVPB     Discontinue     3 g 200 mL/hr over 30 Minutes Intravenous Every 6 hours 01/31/20 1315     01/31/20 0900  vancomycin (VANCOREADY) IVPB 1250 mg/250 mL     Discontinue     1,250 mg 166.7 mL/hr over 90 Minutes Intravenous Every 12 hours 01/30/20 2024     01/30/20 2100  vancomycin (VANCOCIN) 2,500 mg in sodium chloride 0.9 % 500 mL IVPB        2,500 mg 250 mL/hr over 120 Minutes Intravenous  Once 01/30/20 2024 01/31/20 0235   01/30/20 1000  remdesivir 100 mg in sodium chloride 0.9 % 100 mL IVPB     Discontinue    "Followed by" Linked Group Details   100 mg 200 mL/hr over 30 Minutes Intravenous Daily 01/29/20 1546 02/03/20 0959   01/29/20 2200  hydroxychloroquine (PLAQUENIL) tablet 200 mg     Discontinue     200 mg Oral 2 times daily 01/29/20 1546     01/29/20 2000  cefTRIAXone (ROCEPHIN) 2 g in sodium chloride 0.9 % 100 mL IVPB  Status:  Discontinued        2 g 200 mL/hr over 30 Minutes Intravenous Every 24 hours 01/29/20 1850 01/31/20 0732   01/29/20 2000  azithromycin (ZITHROMAX) 500 mg in sodium chloride 0.9 % 250 mL IVPB  Status:  Discontinued        500 mg 250 mL/hr over 60 Minutes Intravenous Every  24 hours 01/29/20 1850 01/31/20 0732   01/29/20 1545  remdesivir 200 mg in sodium chloride 0.9% 250 mL IVPB       "Followed by" Linked Group Details   200 mg 580 mL/hr over 30 Minutes Intravenous Once 01/29/20 1546 01/29/20 1856        Objective:   Vitals:   01/31/20 0600 01/31/20 0610 01/31/20 0732 01/31/20 1413  BP:  (!) 145/70 133/77 135/74  Pulse:  90 77 86  Resp:  (!) 24 20 (!) 21  Temp:   98.1 F (36.7 C) 98.1 F (36.7 C)  TempSrc:   Oral Oral  SpO2:  91% 93% 97%  Weight: (!) 166.9 kg     Height:        Wt Readings from Last 3 Encounters:  01/31/20 (!) 166.9 kg  01/21/20 (!) 155.1 kg  10/23/19 (!) 160 kg     Intake/Output Summary (Last 24 hours) at 01/31/2020 1629  Last data filed at 01/31/2020 1500 Gross per 24 hour  Intake 2229.1 ml  Output 1000 ml  Net 1229.1 ml     Physical Exam  Awake Alert, Oriented X 3, No new F.N deficits, Normal affect Symmetrical Chest wall movement, Good air movement bilaterally, CTAB RRR,No Gallops,Rubs or new Murmurs, No Parasternal Heave +ve B.Sounds, Abd Soft, No tenderness, No rebound - guarding or rigidity. No Cyanosis, Clubbing or edema, No new Rash or bruise       Data Review:    CBC Recent Labs  Lab 01/28/20 2215 01/30/20 0013 01/31/20 0325  WBC 6.7 4.8 2.8*  HGB 11.6* 11.2* 10.7*  HCT 36.7 35.0* 33.6*  PLT 215 198 201  MCV 87.8 87.5 88.4  MCH 27.8 28.0 28.2  MCHC 31.6 32.0 31.8  RDW 13.2 13.2 13.2  LYMPHSABS 1.3 0.7 0.9  MONOABS 0.5 0.1 0.3  EOSABS 0.0 0.0 0.0  BASOSABS 0.0 0.0 0.0    Chemistries  Recent Labs  Lab 01/28/20 2215 01/30/20 0013 01/31/20 0325  NA 137 135 136  K 3.7 4.3 4.7  CL 107 107 108  CO2 19* 18* 19*  GLUCOSE 163* 258* 374*  BUN 9 15 25*  CREATININE 0.98 1.26* 1.68*  CALCIUM 8.5* 8.3* 8.3*  MG  --  1.8 1.9  AST 47* 60* 27  ALT 14 16 15   ALKPHOS 77 70 70  BILITOT 0.5 0.5 0.5    ------------------------------------------------------------------------------------------------------------------ No results for input(s): CHOL, HDL, LDLCALC, TRIG, CHOLHDL, LDLDIRECT in the last 72 hours.  Lab Results  Component Value Date   HGBA1C 10.5 (H) 01/30/2020   ------------------------------------------------------------------------------------------------------------------ No results for input(s): TSH, T4TOTAL, T3FREE, THYROIDAB in the last 72 hours.  Invalid input(s): FREET3 ------------------------------------------------------------------------------------------------------------------ Recent Labs    01/30/20 0013 01/31/20 0325  FERRITIN 427* 536*    Coagulation profile No results for input(s): INR, PROTIME in the last 168 hours.  Recent Labs    01/30/20 0013 01/31/20 0325  DDIMER 1.83* 1.24*    Cardiac Enzymes No results for input(s): CKMB, TROPONINI, MYOGLOBIN in the last 168 hours.  Invalid input(s): CK ------------------------------------------------------------------------------------------------------------------    Component Value Date/Time   BNP 35.8 01/29/2020 1915    Inpatient Medications  Scheduled Meds: . albuterol  2 puff Inhalation Q6H  . vitamin C  500 mg Oral Daily  . azaTHIOprine  150 mg Oral BID  . buPROPion  300 mg Oral Daily  . cetaphil  1 application Topical BID  . cycloSPORINE  1 drop Both Eyes BID  . dexamethasone  6 mg Oral Daily  . enoxaparin (LOVENOX) injection  75 mg Subcutaneous Q24H  . famotidine  20 mg Oral BID  . feeding supplement (GLUCERNA SHAKE)  237 mL Oral TID BM  . hydroxychloroquine  200 mg Oral BID  . insulin aspart  0-15 Units Subcutaneous TID WC  . insulin aspart  0-5 Units Subcutaneous QHS  . [START ON 02/01/2020] insulin glargine  50 Units Subcutaneous Daily  . levothyroxine  50 mcg Oral Q0600  . loratadine  10 mg Oral Daily  . mometasone-formoterol  2 puff Inhalation BID  . pantoprazole  40 mg Oral  Daily  . pravastatin  10 mg Oral Daily  . pregabalin  75 mg Oral TID  . sodium chloride flush  3 mL Intravenous Q12H  . sucralfate  1 g Oral BID  . zinc sulfate  220 mg Oral Daily   Continuous Infusions: . sodium chloride Stopped (01/29/20 2205)  . sodium chloride 75 mL/hr at  01/31/20 0913  . ampicillin-sulbactam (UNASYN) IV 3 g (01/31/20 1421)  . remdesivir 100 mg in NS 100 mL 100 mg (01/31/20 1257)  . vancomycin 1,250 mg (01/31/20 0913)   PRN Meds:.sodium chloride, acetaminophen, ALPRAZolam, diclofenac Sodium, doxepin, fluticasone, guaiFENesin-dextromethorphan, ondansetron **OR** ondansetron (ZOFRAN) IV, oxyCODONE, promethazine, traMADol  Micro Results Recent Results (from the past 240 hour(s))  SARS Coronavirus 2 by RT PCR (hospital order, performed in [facility] hospital lab) Nasopharyngeal Nasopharyngeal Swab     Status: Abnormal   Collection Time: 01/29/20  9:56 AM   Specimen: Nasopharyngeal Swab  Result Value Ref Range Status   SARS Coronavirus 2 POSITIVE (A) NEGATIVE Final    Comment: RESULT CALLED TO, READ BACK BY AND VERIFIED WITH: RN LOUIE CHILTON [id number] 01/29/20 KB (NOTE) SARS-CoV-2 target nucleic acids are DETECTED  SARS-CoV-2 RNA is generally detectable in upper respiratory specimens  during the acute phase of infection.  Positive results are indicative  of the presence of the identified virus, but do not rule out bacterial infection or co-infection with other pathogens not detected by the test.  Clinical correlation with patient history and  other diagnostic information is necessary to determine patient infection status.  The expected result is negative.  Fact Sheet for Patients:   StrictlyIdeas.no   Fact Sheet for Healthcare Providers:   BankingDealers.co.za    This test is not yet approved or cleared by the [place] FDA and  has been authorized for detection and/or diagnosis of SARS-CoV-2 by FDA under an  Emergency Use Authorization (EUA).  This EUA will remain in effect (meaning this tes t can be used) for the duration of  the COVID-19 declaration under Section 564(b)(1) of the Act, 21 U.S.C. section 360-bbb-3(b)(1), unless the authorization is terminated or revoked sooner.  Performed at [facility], [place] [address] [place], [place] [address]   Culture, Urine     Status: Abnormal   Collection Time: 01/29/20  2:06 PM   Specimen: Urine, Random  Result Value Ref Range Status   Specimen Description URINE, RANDOM  Final   Special Requests NONE  Final   Culture (A)  Final    >=100,000 COLONIES/mL DIPHTHEROIDS(CORYNEBACTERIUM SPECIES) Standardized susceptibility testing for this organism is not available. Performed at [facility], [place] [address], [place], [place] [address]    Report Status 01/30/2020 FINAL  Final  Culture, blood (Routine X 2) w Reflex to ID Panel     Status: None (Preliminary result)   Collection Time: 01/29/20  6:57 PM   Specimen: BLOOD  Result Value Ref Range Status   Specimen Description BLOOD RIGHT ANTECUBITAL  Final   Special Requests   Final    BOTTLES DRAWN AEROBIC AND ANAEROBIC Blood Culture adequate volume   Culture  Setup Time   Final    IN BOTH AEROBIC AND ANAEROBIC BOTTLES GRAM POSITIVE RODS CRITICAL RESULT CALLED TO, READ BACK BY AND VERIFIED WITH: Salome Holmes [facility] 01/30/20 1938 JDW    Culture   Final    CULTURE REINCUBATED FOR BETTER GROWTH Performed at [facility], [place] [address], [place], [place] [address]    Report Status PENDING  Incomplete  Culture, blood (Routine X 2) w Reflex to ID Panel     Status: None (Preliminary result)   Collection Time: 01/29/20  7:05 PM   Specimen: BLOOD LEFT HAND  Result Value Ref Range Status   Specimen Description BLOOD LEFT HAND  Final   Special Requests   Final  BOTTLES DRAWN AEROBIC AND ANAEROBIC Blood Culture adequate volume   Culture  Setup Time   Final    ANAEROBIC BOTTLE ONLY GRAM  POSITIVE RODS CRITICAL VALUE NOTED.  VALUE IS CONSISTENT WITH PREVIOUSLY REPORTED AND CALLED VALUE.    Culture   Final    CULTURE REINCUBATED FOR BETTER GROWTH Performed at [facility], [place] [address], [place], [place] [address]    Report Status PENDING  Incomplete    Radiology Reports CT ABDOMEN PELVIS W CONTRAST  Result Date: 01/29/2020 CLINICAL DATA:  Fever, body aches, and sore throat for the past 2 days. Lower abdominal pain. COVID-19 positive. EXAM: CT ABDOMEN AND PELVIS WITH CONTRAST TECHNIQUE: Multidetector CT imaging of the abdomen and pelvis was performed using the standard protocol following bolus administration of intravenous contrast. CONTRAST:  170mL OMNIPAQUE IOHEXOL 300 MG/ML  SOLN COMPARISON:  CT abdomen pelvis dated October 23, 2019. FINDINGS: Lower chest: Small subpleural ground-glass densities at the lung bases with more focal consolidation versus atelectasis in the left lower lobe. Hepatobiliary: Heterogeneous hepatic parenchyma and ill-defined hypodense liver lesions are less conspicuous when compared to the prior study due to different phase of contrast. No obvious significant interval change. No new focal liver abnormality. The gallbladder is unremarkable. No biliary dilatation. Pancreas: Unremarkable. No pancreatic ductal dilatation or surrounding inflammatory changes. Spleen: Normal in size without focal abnormality. Adrenals/Urinary Tract: Adrenal glands are unremarkable. Unchanged punctate calculus in the upper pole of the right kidney. No hydronephrosis. The bladder is unremarkable. Stomach/Bowel: Stomach is within normal limits. Appendix appears normal. No evidence of bowel wall thickening, distention, or inflammatory changes. Vascular/Lymphatic: No significant vascular findings are present. No enlarged abdominal or pelvic lymph nodes. Reproductive: Uterus and bilateral adnexa are unremarkable. Other: No abdominal wall hernia or abnormality. No abdominopelvic ascites.  No pneumoperitoneum. Musculoskeletal: No acute or significant osseous findings. IMPRESSION: 1. No acute intra-abdominal process. 2. Small subpleural ground-glass densities at the lung bases with more focal consolidation versus atelectasis in the left lower lobe. Findings are suspicious for COVID-19 pneumonia given clinical history. 3. Heterogeneous hepatic parenchyma and ill-defined hypodense liver lesions are less conspicuous when compared to the prior study due to different phase of contrast, but there is no obvious significant interval change. 4. Unchanged nonobstructive right nephrolithiasis. Electronically Signed   By: Titus Dubin M.D.   On: 01/29/2020 14:06   DG Chest Portable 1 View  Result Date: 01/29/2020 CLINICAL DATA:  Dyspnea, fever, nausea and vomiting. EXAM: PORTABLE CHEST 1 VIEW COMPARISON:  Single-view of the chest 06/24/2020 and 08/13/2019. CT chest 10/05/2019. FINDINGS: There is cardiomegaly and pulmonary vascular congestion. No consolidative process, pneumothorax or effusion is identified. No acute or focal bony abnormality. IMPRESSION: Cardiomegaly and pulmonary vascular congestion. Electronically Signed   By: Inge Rise M.D.   On: 01/29/2020 09:13     Phillips Climes M.D on 01/31/2020 at 4:29 PM    [facility] -  Office  [phone]

## 2020-01-31 NOTE — Progress Notes (Addendum)
Pharmacy Antibiotic Note  [name] is a 49 y.o. female admitted on 01/28/2020 with SOB and positive for COVID-19. Pt growing diphtheroids in urine, now with gram positive rods in 1 of 4 blood cultures (currently unidentified). Pharmacy has been consulted for vancomycin dosing given that pt is chronically immunosuppressed. Cr up this morning, pt afebrile.  Blood culture is now 2/4 bottles with GPR (1 bottle from each set). Dr [facility] has discussed with Dr Tommy Medal and we will add Unasyn for potential lysteria. Pt does have PCN allergy documented in the system as rash. D/w Dr [facility] and we will proceed with Unasyn and monitor. She is already on dexa for COVID.   Plan: Vancomycin 2500mg  IV x1 then 1250mg  IV q12h Unasyn 3g IV q6 Check vancomycin trough at Css F/U speciation of cultures   Height: 5\' 8"  (172.7 cm) Weight: (!) 166.9 kg (367 lb 14.4 oz) IBW/kg (Calculated) : 63.9  Temp (24hrs), Avg:98.2 F (36.8 C), Min:97.8 F (36.6 C), Max:98.6 F (37 C)  Recent Labs  Lab 01/28/20 2215 01/29/20 0950 01/30/20 0013 01/31/20 0325  WBC 6.7  --  4.8 2.8*  CREATININE 0.98  --  1.26* 1.68*  LATICACIDVEN  --  1.4  --   --     Estimated Creatinine Clearance: 67.2 mL/min (A) (by C-G formula based on SCr of 1.68 mg/dL (H)).    Allergies  Allergen Reactions   Metoclopramide Other (See Comments)    Developed restless leg, akathisia type limb movements.    Codeine Itching, Rash and Other (See Comments)   Penicillins Itching    Has patient had a PCN reaction causing immediate rash, facial/tongue/throat swelling, SOB or lightheadedness with hypotension: No Has patient had a PCN reaction causing severe rash involving mucus membranes or skin necrosis: No Has patient had a PCN reaction that required hospitalization: No Has patient had a PCN reaction occurring within the last 10 years:yes If all of the above answers are "NO", then may proceed with Cephalosporin use.   Hydrocodone Rash    Percocet [Oxycodone-Acetaminophen] Hives and Rash    Tolerates dilaudid    Antimicrobials this admission: Remdesivir 6/30>>7/4 Azithro 6/30 >>7/2 Rocephin 6/30 >>7/2 Vancomycin 7/1 >> Unasyn 7/2>>  Microbiology results: 6/30 BCx: 2o4 GP rods 6/30 UCx: >100k diphtheroids  Onnie Boer, PharmD, BCIDP, AAHIVP, CPP Infectious Disease Pharmacist 01/31/2020 12:51 PM

## 2020-01-31 NOTE — Progress Notes (Signed)
   01/30/20 2141  Assess: MEWS Score  Temp 98.2 F (36.8 C)  BP (!) 95/51  Pulse Rate 84  ECG Heart Rate 84  Resp (!) 21  Level of Consciousness Alert  SpO2 93 %  O2 Device Room Air  Patient Activity (if Appropriate) In bed  Assess: MEWS Score  MEWS Temp 0  MEWS Systolic 1  MEWS Pulse 0  MEWS RR 1  MEWS LOC 0  MEWS Score 2  MEWS Score Color Yellow  Assess: if the MEWS score is Yellow or Red  Were vital signs taken at a resting state? Yes  Focused Assessment Documented focused assessment  Early Detection of Sepsis Score *See Row Information* Low  MEWS guidelines implemented *See Row Information* Yes  Treat  MEWS Interventions Other (Comment) (Held scheduled BP med.)  Take Vital Signs  Increase Vital Sign Frequency  Yellow: Q 2hr X 2 then Q 4hr X 2, if remains yellow, continue Q 4hrs  Escalate  MEWS: Escalate Yellow: discuss with charge nurse/RN and consider discussing with provider and RRT  Notify: Charge Nurse/RN  Name of Charge Nurse/RN Notified Ronalee Belts, Agricultural consultant  Date Charge Nurse/RN Notified 01/30/20  Time Charge Nurse/RN Notified 2230  Notify: Provider  Provider Name/Title R[place], MD  Date Provider Notified 01/30/20  Time Provider Notified 2146  Notification Type Page  Notification Reason Other (Comment) (YELLOW MEWS. BP 95/51.)  Response See new orders  Date of Provider Response 01/30/20  Time of Provider Response 2152  Document  Patient Outcome Stabilized after interventions   Patient c/o generalized lightheadedness at times, but this was before hypotension, unchanged with hypotension. This RN encouraged patient to drink. Will continue to monitor.

## 2020-01-31 NOTE — Progress Notes (Signed)
Inpatient Diabetes Program Recommendations  AACE/ADA: New Consensus Statement on Inpatient Glycemic Control (2015)  Target Ranges:  Prepandial:   less than 140 mg/dL      Peak postprandial:   less than 180 mg/dL (1-2 hours)      Critically ill patients:  140 - 180 mg/dL   Results for [name], [name] (MRN [id number]) as of 01/31/2020 12:21  Ref. Range 01/30/2020 07:44 01/30/2020 11:58 01/30/2020 17:23 01/30/2020 22:04  Glucose-Capillary Latest Ref Range: 70 - 99 mg/dL 252 (H)  5 units NOVOLOG  272 (H)  8 units NOVOLOG  231 (H)  5 units NOVOLOG +  15 units LANTUS given at 3:14pm  358 (H)  5 units NOVOLOG    Results for [name], [name] (MRN [id number]) as of 01/31/2020 12:21  Ref. Range 01/31/2020 07:30 01/31/2020 11:43  Glucose-Capillary Latest Ref Range: 70 - 99 mg/dL 308 (H)  11 units NOVOLOG +  30 units LANTUS given at 10:54am 318 (H)    Home DM Meds: NPH Insulin 170 units QAM       Byetta 10 mcg BID       Metformin XR 1000 mg  BID  Current Orders: Lantus 30 units Daily      Novolog Moderate Correction Scale/ SSI (0-15 units) TID AC + HS     Getting Decadron 6 mg Daily.   Lantus started yest at 3pm (15 units)--Lantus dose doubled this AM (30 units).     MD- Note Lantus dose doubled this AM.  Patient takes very large dose of NPH Insulin once daily at home.  Ate 100% breakfast this AM and CBG rose to 318 at 12pm today.  Please consider the following:  1. Start Novolog Meal Coverage: Novolog 6 units TID with meals  (Please add the following Hold Parameters: Hold if pt eats <50% of meal, Hold if pt NPO)   2. If AM CBG remains elevated tomorrow AM (07/03), please consider further increasing Lantus as well     --Will follow patient during hospitalization--  Wyn Quaker RN, MSN, CDE Diabetes Coordinator Inpatient Glycemic Control Team Team Pager: [phone] (8a-5p)

## 2020-01-31 NOTE — TOC Initial Note (Signed)
Transition of Care [facility]) - Initial/Assessment Note    Patient Details  Name: [name] MRN: [id number] Date of Birth: Sep 03, 1970  Transition of Care [facility]) CM/SW Contact:    Verdell Carmine, RN Phone Number: 01/31/2020, 3:11 PM  Clinical Narrative:                 Admitted for pneumonia COVID, shortness of breath. , history of Lupus, and CHF EF 40-45%Currently on IV abx, supportive oxygen, plan, over 3 MN, May weaken during this time and need HH or rehabilitation. Will continue to follow for needs.  .   Expected Discharge Plan: Home/Self Care Barriers to Discharge: Continued Medical Work up   Patient Goals and CMS Choice        Expected Discharge Plan and Services Expected Discharge Plan: Home/Self Care       Living arrangements for the past 2 months: Single Family Home                                      Prior Living Arrangements/Services Living arrangements for the past 2 months: Single Family Home   Patient language and need for interpreter reviewed:: No        Need for Family Participation in Patient Care: Yes (Comment) Care giver support system in place?: Yes (comment)   Criminal Activity/Legal Involvement Pertinent to Current Situation/Hospitalization: No - Comment as needed  Activities of Daily Living Home Assistive Devices/Equipment: Cane (specify quad or straight) ADL Screening (condition at time of admission) Patient's cognitive ability adequate to safely complete daily activities?: Yes Is the patient deaf or have difficulty hearing?: No Does the patient have difficulty seeing, even when wearing glasses/contacts?: No Does the patient have difficulty concentrating, remembering, or making decisions?: No Patient able to express need for assistance with ADLs?: Yes Does the patient have difficulty dressing or bathing?: No Independently performs ADLs?: Yes (appropriate for developmental age) Does the patient have difficulty walking or climbing  stairs?: No Weakness of Legs: Both Weakness of Arms/Hands: None  Permission Sought/Granted                  Emotional Assessment Appearance:: Appears older than stated age       Alcohol / Substance Use: Tobacco Use Psych Involvement: No (comment)  Admission diagnosis:  Pneumonia due to COVID-19 virus [U07.1, J12.82] COVID-19 [U07.1] Patient Active Problem List   Diagnosis Date Noted  . Pneumonia due to COVID-19 virus 01/29/2020  . Pressure injury of skin 08/30/2019  . Physical debility 08/29/2019  . Obesity hypoventilation syndrome ([place]) 08/23/2019  . Skin breakdown 08/23/2019  . Essential hypertension 08/23/2019  . Liver hemorrhage 08/23/2019  . Acute blood loss anemia 08/13/2019  . Hepatic adenoma   . Hemoperitoneum   . Diabetic acidosis without coma ([place])   . Multifocal pneumonia 09/23/2017  . Acute respiratory failure ([place]) 09/21/2017  . CHF exacerbation ([place]) 09/20/2017  . Costochondritis 06/06/2017  . Chest pain 06/04/2017  . Diabetes ([place]) 09/19/2015  . Radiculopathy of cervical spine   . Severe headache 03/05/2014  . Family history of malignant neoplasm of breast   . Family history of malignant neoplasm of ovary   . Intractable nausea and vomiting 11/11/2013  . Septate uterus 09/08/2013  . Diabetic gastroparesis ([place]) 04/28/2013  . Constipation due to pain medication 04/21/2013  . Odynophagia 04/04/2013  . Liver masses 02/28/2013  . Reflux esophagitis 02/26/2013  .  Morbid obesity ([place]) 10/10/2012  . Chronic diastolic CHF (congestive heart failure) ([place]) 10/09/2012  . Chronic respiratory failure with hypoxia ([place]) 10/09/2012  . Clinical polymyositis syndrome ([place]) 09/06/2012  . Mononeuritis of lower limb 01/12/2012  . HTN (hypertension) 11/08/2011  . Chronic systolic CHF (congestive heart failure) ([place]) 11/08/2011  . HLD (hyperlipidemia) 07/19/2011  . Bell's palsy 08/09/2010  . SLE 08/09/2010  . [place] SYNDROME 08/09/2010   PCP:  Bartholome Bill, MD Pharmacy:   [id number] - [place], [address] [place] [address] Phone: [phone] Fax: [phone]  [facility] - [place], [place] [address] [place] [address] [place] [address] Phone: [phone] Fax: [phone]     Social Determinants of Health (SDOH) Interventions    Readmission Risk Interventions No flowsheet data found.

## 2020-02-01 ENCOUNTER — Inpatient Hospital Stay (HOSPITAL_COMMUNITY): Payer: Medicaid Other

## 2020-02-01 ENCOUNTER — Encounter (HOSPITAL_COMMUNITY): Payer: Self-pay | Admitting: Internal Medicine

## 2020-02-01 DIAGNOSIS — I10 Essential (primary) hypertension: Secondary | ICD-10-CM

## 2020-02-01 LAB — CBC WITH DIFFERENTIAL/PLATELET
Abs Immature Granulocytes: 0.03 10*3/uL (ref 0.00–0.07)
Basophils Absolute: 0 10*3/uL (ref 0.0–0.1)
Basophils Relative: 0 %
Eosinophils Absolute: 0 10*3/uL (ref 0.0–0.5)
Eosinophils Relative: 0 %
HCT: 37.4 % (ref 36.0–46.0)
Hemoglobin: 11.4 g/dL — ABNORMAL LOW (ref 12.0–15.0)
Immature Granulocytes: 0 %
Lymphocytes Relative: 9 %
Lymphs Abs: 0.7 10*3/uL (ref 0.7–4.0)
MCH: 27.5 pg (ref 26.0–34.0)
MCHC: 30.5 g/dL (ref 30.0–36.0)
MCV: 90.1 fL (ref 80.0–100.0)
Monocytes Absolute: 0.5 10*3/uL (ref 0.1–1.0)
Monocytes Relative: 6 %
Neutro Abs: 6.8 10*3/uL (ref 1.7–7.7)
Neutrophils Relative %: 85 %
Platelets: 225 10*3/uL (ref 150–400)
RBC: 4.15 MIL/uL (ref 3.87–5.11)
RDW: 13.5 % (ref 11.5–15.5)
WBC: 8 10*3/uL (ref 4.0–10.5)
nRBC: 0 % (ref 0.0–0.2)

## 2020-02-01 LAB — COMPREHENSIVE METABOLIC PANEL
ALT: 13 U/L (ref 0–44)
AST: 22 U/L (ref 15–41)
Albumin: 2.6 g/dL — ABNORMAL LOW (ref 3.5–5.0)
Alkaline Phosphatase: 79 U/L (ref 38–126)
Anion gap: 9 (ref 5–15)
BUN: 18 mg/dL (ref 6–20)
CO2: 19 mmol/L — ABNORMAL LOW (ref 22–32)
Calcium: 8.6 mg/dL — ABNORMAL LOW (ref 8.9–10.3)
Chloride: 109 mmol/L (ref 98–111)
Creatinine, Ser: 1.3 mg/dL — ABNORMAL HIGH (ref 0.44–1.00)
GFR calc Af Amer: 56 mL/min — ABNORMAL LOW (ref >60)
GFR calc non Af Amer: 48 mL/min — ABNORMAL LOW (ref >60)
Glucose, Bld: 415 mg/dL — ABNORMAL HIGH (ref 70–99)
Potassium: 4.4 mmol/L (ref 3.5–5.1)
Sodium: 137 mmol/L (ref 135–145)
Total Bilirubin: 0.6 mg/dL (ref 0.3–1.2)
Total Protein: 6.2 g/dL — ABNORMAL LOW (ref 6.5–8.1)

## 2020-02-01 LAB — FERRITIN: Ferritin: 518 ng/mL — ABNORMAL HIGH (ref 11–307)

## 2020-02-01 LAB — CULTURE, BLOOD (ROUTINE X 2): Special Requests: ADEQUATE

## 2020-02-01 LAB — GLUCOSE, CAPILLARY
Glucose-Capillary: 243 mg/dL — ABNORMAL HIGH (ref 70–99)
Glucose-Capillary: 290 mg/dL — ABNORMAL HIGH (ref 70–99)
Glucose-Capillary: 358 mg/dL — ABNORMAL HIGH (ref 70–99)
Glucose-Capillary: 319 mg/dL — ABNORMAL HIGH (ref 70–99)

## 2020-02-01 LAB — C-REACTIVE PROTEIN: CRP: 1.5 mg/dL — ABNORMAL HIGH (ref <1.0)

## 2020-02-01 LAB — D-DIMER, QUANTITATIVE: D-Dimer, Quant: 1.2 ug/mL-FEU — ABNORMAL HIGH (ref 0.00–0.50)

## 2020-02-01 LAB — MAGNESIUM: Magnesium: 1.9 mg/dL (ref 1.7–2.4)

## 2020-02-01 MED ORDER — INSULIN GLARGINE 100 UNIT/ML ~~LOC~~ SOLN
70.0000 [IU] | Freq: Every day | SUBCUTANEOUS | Status: DC
  Administered 2020-02-01: 70 [IU] via SUBCUTANEOUS
  Filled 2020-02-01 (×2): qty 0.7

## 2020-02-01 MED ORDER — INSULIN GLARGINE 100 UNIT/ML ~~LOC~~ SOLN
40.0000 [IU] | Freq: Every day | SUBCUTANEOUS | Status: AC
  Administered 2020-02-01: 40 [IU] via SUBCUTANEOUS
  Filled 2020-02-01 (×2): qty 0.4

## 2020-02-01 NOTE — Progress Notes (Signed)
Blood culture came back with CORYNEBACTERIUM STRIATUM. These are usually sensitive to vanc. Ok to [employer] per Dr. Waldron [facility].   Onnie Boer, PharmD, BCIDP, AAHIVP, CPP Infectious Disease Pharmacist 02/01/2020 1:17 PM

## 2020-02-01 NOTE — Progress Notes (Signed)
PROGRESS NOTE                                                                                                                                                                                                             Patient Demographics:    [name], is a 49 y.o. female, DOB - 04-17-1971, PYK:[id number]  Admit date - 01/28/2020   Admitting Physician Norval Morton, MD  Outpatient Primary MD for the patient is Bartholome Bill, MD  LOS - 3   Chief Complaint  Patient presents with  . COVID like symptoms       Brief Narrative   HPI: [name] is a 49 y.o. female with medical history significant of systolic CHF, lupus, diabetes mellitus type 2 with gastroparesis, and morbid obesity presents with complaints of shortness of breath and nausea over the last 5 days.  Reports having associated symptoms of subjective fever, chills, cough, congestion, chest tightness, headache, generalized body aches, joint pain, epigastric abdominal pain, and vomiting.  Emesis has been nonbloody in appearance.  Denies having any diarrhea, dysuria, or urinary frequency.  Since symptoms started she has been unable to take any of her medications due to her nausea and vomiting she is followed by Dr. Einar Gip of cardiology in the outpatient setting.  Last EF noted to be around 45 - 50% by echocardiogram from 05/2018.  Patient reports that she did not get her Covid vaccine as her doctors want sure if she should take it.  ED Course: Upon admission into the hospital patient was noted to be febrile up to 103 F, heart rates elevated up to 118, respiration 15-24, blood pressures 137/91-188/88, and O2 saturations currently maintained on on room air.  With ambulation patient's O2 saturations dropped to 89%.  Labs revealed hemoglobin 11.6 and glucose 163.  CT scan of the abdomen pelvis revealed signs of a multifocal pneumonia concerning for Covid.  Covid 19 screening was positive.   Patient had been given Dilaudid, GI cocktail, Zofran, and Protonix.  TRH called to admit.    Subjective:    [name] today reports her nausea and vomiting has improved, but still complains of significant cough, and abdominal/chest pain due to cough and vomiting.   Assessment  & Plan :    Principal Problem:   Pneumonia due to COVID-19 virus Active  Problems:   SLE   HTN (hypertension)   Chronic systolic CHF (congestive heart failure) ([place])   Morbid obesity ([place])   Diabetes ([place])   Sepsis secondary pneumonia due to COVID-19: -  Acute.  Patient presented febrile up to 103 F with tachycardia and tachypnea.   -Remains with no oxygen requirement this morning. -Remains with significant cough, dyspnea with minimal exertion, continue with IV remdesivir and steroids. -Empiric antibiotics of Rocephin and azithromycin due to immunocompromises status , procalcitonin within normal limit, does not appear to be bacterial pneumonia, have stopped her antibiotics . -Vitamin C and zinc -Tylenol as needed for fever -Check inflammatory markers stat and monitor daily COVID-19 Labs  Recent Labs    01/29/20 1915 01/29/20 1915 01/30/20 0013 01/31/20 0325 02/01/20 1337  DDIMER 2.02*   < > 1.83* 1.24* 1.20*  FERRITIN 403*   < > 427* 536* 518*  LDH 244*  --   --   --   --   CRP 8.0*   < > 7.2* 4.5* 1.5*   < > = values in this interval not displayed.    Lab Results  Component Value Date   SARSCOV2NAA POSITIVE (A) 01/29/2020   [place] NEGATIVE 10/23/2019   [place] NEGATIVE 08/24/2019   [place] NEGATIVE 08/12/2019     Corynebacterium UTI/bacteremia -Patient is immunocompromise, I have discussed with ID Dr. Drucilla Schmidt, even though Corynebacterium is uncommon pathogen for UTI, but given her bacteremia need to be treated, recommendation is 14 days of antibiotics, can continue with IV vancomycin during hospital stay, this can be transitioned to Zyvox upon discharge with goal of 14 days  treatment. -We will repeat blood cultures in a.m.Marland Kitchen -We will check 2D echo as discussed with ID.  Chronic systolic CHF: Chronic.   - Patient does not appear to be grossly fluid overloaded at this time.  Last EF noted to be 45 to 50% back in 2019.  Initially with her nausea and vomiting, I will hold on any diuresis. -Strict intake and output  Hypertension -Home medications include amlodipine 5 mg daily, BiDil 2 tablets 3 times daily, Coreg 25 mg twice daily, Entresto twice daily, spironolactone 50 mg daily, and torsemide 20 mg daily.   -Blood pressure is low, so all this regimen has been stopped . -Pressure started to increase, but overall on the normal, so we will hold on resuming her medication today.  AKI  -Secondary to volume depletion from nausea and vomiting, and hypotension, this has responded to IV fluids .  Pain improved from 1.68> 1.3. -hold nephrotoxic medications . -Avoid hypotension  Diabetes mellitus type 2:  -  A1c this admission is 10.5, dose of insulin at home, NPH insulin 1 7 units daily, Byetta 10 mcg twice daily, and metformin XR 1000 mg twice daily, Lantus was increased to 7 units this morning, despite that she remains with elevated readings, I will add 30 units of NovoLog at bedtime as well. -Continue with sliding scale, and 6 units of NovoLog before meals. -Hypoglycemic protocol -Hold Metformin  -Continue Byetta 10 mcg twice daily -CBGs before every meal with moderate SSI  Systemic Lupus: Patient is on Imuran 150 mg twice daily, belimumab 200 mg q. weekly on Tuesday, and prednisone. -Continue Imuran  Anxiety: Home medications include Wellbutrin 300 mg daily and Xanax 0.5 mg as needed. -Continue current home regimen  Hypothyroidism: Home medication regimen includes levothyroxine 50 mcg daily. -Continue levothyroxine  Hyperlipidemia -Continue pravastatin  GERD: Home medications include Pepcid 20 mg twice daily and  Carafate 1 g twice daily. -Continue  home regimen  Morbid obesity: BMI 56.15 kg/m -Will need continued education on need weight loss   COVID-19 Labs  Recent Labs    01/29/20 1915 01/29/20 1915 01/30/20 0013 01/31/20 0325 02/01/20 1337  DDIMER 2.02*   < > 1.83* 1.24* 1.20*  FERRITIN 403*   < > 427* 536* 518*  LDH 244*  --   --   --   --   CRP 8.0*   < > 7.2* 4.5* 1.5*   < > = values in this interval not displayed.    Lab Results  Component Value Date   SARSCOV2NAA POSITIVE (A) 01/29/2020   [place] NEGATIVE 10/23/2019   [place] NEGATIVE 08/24/2019   [place] NEGATIVE 08/12/2019     Code Status : Full  Family Communication  : D/W patient  Disposition Plan  :  Status is: Inpatient  Remains inpatient appropriate because:IV treatments appropriate due to intensity of illness or inability to take PO   Dispo: The patient is from: Home              Anticipated d/c is to: Home              Anticipated d/c date is: > 3 days              Patient currently is not medically stable to d/c.        Barriers For Discharge :   Consults  :  none  Procedures  : None  DVT Prophylaxis  :  [place] lovenox  Lab Results  Component Value Date   PLT 225 02/01/2020    Antibiotics  :    Anti-infectives (From admission, onward)   Start     Dose/Rate Route Frequency Ordered Stop   01/31/20 1400  Ampicillin-Sulbactam (UNASYN) 3 g in sodium chloride 0.9 % 100 mL IVPB  Status:  Discontinued        3 g 200 mL/hr over 30 Minutes Intravenous Every 6 hours 01/31/20 1315 02/01/20 1316   01/31/20 0900  vancomycin (VANCOREADY) IVPB 1250 mg/250 mL     Discontinue     1,250 mg 166.7 mL/hr over 90 Minutes Intravenous Every 12 hours 01/30/20 2024     01/30/20 2100  vancomycin (VANCOCIN) 2,500 mg in sodium chloride 0.9 % 500 mL IVPB        2,500 mg 250 mL/hr over 120 Minutes Intravenous  Once 01/30/20 2024 01/31/20 0235   01/30/20 1000  remdesivir 100 mg in sodium chloride 0.9 % 100 mL IVPB     Discontinue      "Followed by" Linked Group Details   100 mg 200 mL/hr over 30 Minutes Intravenous Daily 01/29/20 1546 02/03/20 0959   01/29/20 2200  hydroxychloroquine (PLAQUENIL) tablet 200 mg     Discontinue     200 mg Oral 2 times daily 01/29/20 1546     01/29/20 2000  cefTRIAXone (ROCEPHIN) 2 g in sodium chloride 0.9 % 100 mL IVPB  Status:  Discontinued        2 g 200 mL/hr over 30 Minutes Intravenous Every 24 hours 01/29/20 1850 01/31/20 0732   01/29/20 2000  azithromycin (ZITHROMAX) 500 mg in sodium chloride 0.9 % 250 mL IVPB  Status:  Discontinued        500 mg 250 mL/hr over 60 Minutes Intravenous Every 24 hours 01/29/20 1850 01/31/20 0732   01/29/20 1545  remdesivir 200 mg in sodium chloride 0.9% 250 mL IVPB       "  Followed by" Linked Group Details   200 mg 580 mL/hr over 30 Minutes Intravenous Once 01/29/20 1546 01/29/20 1856        Objective:   Vitals:   01/31/20 1413 01/31/20 2025 02/01/20 0442 02/01/20 0444  BP: 135/74 113/61 131/88   Pulse: 86 82 75   Resp: (!) 21 (!) 21 19   Temp: 98.1 F (36.7 C) 98.6 F (37 C) 99 F (37.2 C)   TempSrc: Oral Oral Axillary   SpO2: 97% 96% 95%   Weight:    (!) 171.9 kg  Height:        Wt Readings from Last 3 Encounters:  02/01/20 (!) 171.9 kg  01/21/20 (!) 155.1 kg  10/23/19 (!) 160 kg     Intake/Output Summary (Last 24 hours) at 02/01/2020 1528 Last data filed at 02/01/2020 0600 Gross per 24 hour  Intake 1317.63 ml  Output --  Net 1317.63 ml     Physical Exam  Awake Alert, Oriented X 3, No new F.N deficits, Normal affect Symmetrical Chest wall movement, Good air movement bilaterally, CTAB RRR,No Gallops,Rubs or new Murmurs, No Parasternal Heave +ve B.Sounds, Abd Soft, No tenderness, No rebound - guarding or rigidity. No Cyanosis, Clubbing or edema, No new Rash or bruise        Data Review:    CBC Recent Labs  Lab 01/28/20 2215 01/30/20 0013 01/31/20 0325 02/01/20 1337  WBC 6.7 4.8 2.8* 8.0  HGB 11.6* 11.2* 10.7*  11.4*  HCT 36.7 35.0* 33.6* 37.4  PLT 215 198 201 225  MCV 87.8 87.5 88.4 90.1  MCH 27.8 28.0 28.2 27.5  MCHC 31.6 32.0 31.8 30.5  RDW 13.2 13.2 13.2 13.5  LYMPHSABS 1.3 0.7 0.9 0.7  MONOABS 0.5 0.1 0.3 0.5  EOSABS 0.0 0.0 0.0 0.0  BASOSABS 0.0 0.0 0.0 0.0    Chemistries  Recent Labs  Lab 01/28/20 2215 01/30/20 0013 01/31/20 0325 02/01/20 1337  NA 137 135 136 137  K 3.7 4.3 4.7 4.4  CL 107 107 108 109  CO2 19* 18* 19* 19*  GLUCOSE 163* 258* 374* 415*  BUN 9 15 25* 18  CREATININE 0.98 1.26* 1.68* 1.30*  CALCIUM 8.5* 8.3* 8.3* 8.6*  MG  --  1.8 1.9 1.9  AST 47* 60* 27 22  ALT 14 16 15 13   ALKPHOS 77 70 70 79  BILITOT 0.5 0.5 0.5 0.6   ------------------------------------------------------------------------------------------------------------------ No results for input(s): CHOL, HDL, LDLCALC, TRIG, CHOLHDL, LDLDIRECT in the last 72 hours.  Lab Results  Component Value Date   HGBA1C 10.5 (H) 01/30/2020   ------------------------------------------------------------------------------------------------------------------ No results for input(s): TSH, T4TOTAL, T3FREE, THYROIDAB in the last 72 hours.  Invalid input(s): FREET3 ------------------------------------------------------------------------------------------------------------------ Recent Labs    01/31/20 0325 02/01/20 1337  FERRITIN 536* 518*    Coagulation profile No results for input(s): INR, PROTIME in the last 168 hours.  Recent Labs    01/31/20 0325 02/01/20 1337  DDIMER 1.24* 1.20*    Cardiac Enzymes No results for input(s): CKMB, TROPONINI, MYOGLOBIN in the last 168 hours.  Invalid input(s): CK ------------------------------------------------------------------------------------------------------------------    Component Value Date/Time   BNP 35.8 01/29/2020 1915    Inpatient Medications  Scheduled Meds: . albuterol  2 puff Inhalation Q6H  . vitamin C  500 mg Oral Daily  . azaTHIOprine   150 mg Oral BID  . buPROPion  300 mg Oral Daily  . cetaphil  1 application Topical BID  . cycloSPORINE  1 drop Both Eyes BID  .  dexamethasone  6 mg Oral Daily  . enoxaparin (LOVENOX) injection  75 mg Subcutaneous Q24H  . famotidine  20 mg Oral BID  . feeding supplement (GLUCERNA SHAKE)  237 mL Oral TID BM  . hydroxychloroquine  200 mg Oral BID  . insulin aspart  0-15 Units Subcutaneous TID WC  . insulin aspart  0-5 Units Subcutaneous QHS  . insulin aspart  6 Units Subcutaneous TID WC  . insulin glargine  40 Units Subcutaneous QHS  . insulin glargine  70 Units Subcutaneous Daily  . levothyroxine  50 mcg Oral Q0600  . loratadine  10 mg Oral Daily  . mometasone-formoterol  2 puff Inhalation BID  . pantoprazole  40 mg Oral Daily  . pravastatin  10 mg Oral Daily  . pregabalin  75 mg Oral TID  . sodium chloride flush  3 mL Intravenous Q12H  . sucralfate  1 g Oral BID  . zinc sulfate  220 mg Oral Daily   Continuous Infusions: . sodium chloride Stopped (01/29/20 2205)  . remdesivir 100 mg in NS 100 mL 100 mg (02/01/20 1105)  . vancomycin 1,250 mg (02/01/20 0933)   PRN Meds:.sodium chloride, acetaminophen, ALPRAZolam, diclofenac Sodium, doxepin, fluticasone, guaiFENesin-dextromethorphan, ondansetron **OR** ondansetron (ZOFRAN) IV, oxyCODONE, promethazine, traMADol  Micro Results Recent Results (from the past 240 hour(s))  SARS Coronavirus 2 by RT PCR (hospital order, performed in [facility] hospital lab) Nasopharyngeal Nasopharyngeal Swab     Status: Abnormal   Collection Time: 01/29/20  9:56 AM   Specimen: Nasopharyngeal Swab  Result Value Ref Range Status   SARS Coronavirus 2 POSITIVE (A) NEGATIVE Final    Comment: RESULT CALLED TO, READ BACK BY AND VERIFIED WITH: RN LOUIE CHILTON 1884 01/29/20 KB (NOTE) SARS-CoV-2 target nucleic acids are DETECTED  SARS-CoV-2 RNA is generally detectable in upper respiratory specimens  during the acute phase of infection.  Positive results are  indicative  of the presence of the identified virus, but do not rule out bacterial infection or co-infection with other pathogens not detected by the test.  Clinical correlation with patient history and  other diagnostic information is necessary to determine patient infection status.  The expected result is negative.  Fact Sheet for Patients:   StrictlyIdeas.no   Fact Sheet for Healthcare Providers:   BankingDealers.co.za    This test is not yet approved or cleared by the [place] FDA and  has been authorized for detection and/or diagnosis of SARS-CoV-2 by FDA under an Emergency Use Authorization (EUA).  This EUA will remain in effect (meaning this tes t can be used) for the duration of  the COVID-19 declaration under Section 564(b)(1) of the Act, 21 U.S.C. section 360-bbb-3(b)(1), unless the authorization is terminated or revoked sooner.  Performed at [facility], [place] [address], [place] [address], [place] [address]   Culture, Urine     Status: Abnormal   Collection Time: 01/29/20  2:06 PM   Specimen: Urine, Random  Result Value Ref Range Status   Specimen Description URINE, RANDOM  Final   Special Requests NONE  Final   Culture (A)  Final    >=100,000 COLONIES/mL DIPHTHEROIDS(CORYNEBACTERIUM SPECIES) Standardized susceptibility testing for this organism is not available. Performed at [facility], [place] [address] [place], [place] [address]    Report Status 01/30/2020 FINAL  Final  Culture, blood (Routine X 2) w Reflex to ID Panel     Status: Abnormal   Collection Time: 01/29/20  6:57 PM   Specimen: BLOOD  Result Value Ref Range Status   Specimen Description BLOOD RIGHT ANTECUBITAL  Final   Special Requests   Final    BOTTLES DRAWN AEROBIC AND ANAEROBIC Blood Culture adequate volume   Culture  Setup Time   Final    IN BOTH AEROBIC AND ANAEROBIC BOTTLES GRAM POSITIVE RODS CRITICAL RESULT CALLED TO, READ BACK BY AND  VERIFIED WITH: Salome Holmes [facility] 01/30/20 1938 JDW    Culture (A)  Final    CORYNEBACTERIUM STRIATUM Standardized susceptibility testing for this organism is not available. Performed at [facility], [place] [address], [place], [place] [address]    Report Status 02/01/2020 FINAL  Final  Culture, blood (Routine X 2) w Reflex to ID Panel     Status: Abnormal   Collection Time: 01/29/20  7:05 PM   Specimen: BLOOD LEFT HAND  Result Value Ref Range Status   Specimen Description BLOOD LEFT HAND  Final   Special Requests   Final    BOTTLES DRAWN AEROBIC AND ANAEROBIC Blood Culture adequate volume   Culture  Setup Time   Final    ANAEROBIC BOTTLE ONLY GRAM POSITIVE RODS CRITICAL VALUE NOTED.  VALUE IS CONSISTENT WITH PREVIOUSLY REPORTED AND CALLED VALUE.    Culture (A)  Final    CORYNEBACTERIUM STRIATUM Standardized susceptibility testing for this organism is not available. Performed at [facility], [place] [address] [place], [place] [address]    Report Status 02/01/2020 FINAL  Final    Radiology Reports CT ABDOMEN PELVIS W CONTRAST  Result Date: 01/29/2020 CLINICAL DATA:  Fever, body aches, and sore throat for the past 2 days. Lower abdominal pain. COVID-19 positive. EXAM: CT ABDOMEN AND PELVIS WITH CONTRAST TECHNIQUE: Multidetector CT imaging of the abdomen and pelvis was performed using the standard protocol following bolus administration of intravenous contrast. CONTRAST:  153mL OMNIPAQUE IOHEXOL 300 MG/ML  SOLN COMPARISON:  CT abdomen pelvis dated October 23, 2019. FINDINGS: Lower chest: Small subpleural ground-glass densities at the lung bases with more focal consolidation versus atelectasis in the left lower lobe. Hepatobiliary: Heterogeneous hepatic parenchyma and ill-defined hypodense liver lesions are less conspicuous when compared to the prior study due to different phase of contrast. No obvious significant interval change. No new focal liver abnormality. The gallbladder is  unremarkable. No biliary dilatation. Pancreas: Unremarkable. No pancreatic ductal dilatation or surrounding inflammatory changes. Spleen: Normal in size without focal abnormality. Adrenals/Urinary Tract: Adrenal glands are unremarkable. Unchanged punctate calculus in the upper pole of the right kidney. No hydronephrosis. The bladder is unremarkable. Stomach/Bowel: Stomach is within normal limits. Appendix appears normal. No evidence of bowel wall thickening, distention, or inflammatory changes. Vascular/Lymphatic: No significant vascular findings are present. No enlarged abdominal or pelvic lymph nodes. Reproductive: Uterus and bilateral adnexa are unremarkable. Other: No abdominal wall hernia or abnormality. No abdominopelvic ascites. No pneumoperitoneum. Musculoskeletal: No acute or significant osseous findings. IMPRESSION: 1. No acute intra-abdominal process. 2. Small subpleural ground-glass densities at the lung bases with more focal consolidation versus atelectasis in the left lower lobe. Findings are suspicious for COVID-19 pneumonia given clinical history. 3. Heterogeneous hepatic parenchyma and ill-defined hypodense liver lesions are less conspicuous when compared to the prior study due to different phase of contrast, but there is no obvious significant interval change. 4. Unchanged nonobstructive right nephrolithiasis. Electronically Signed   By: Titus Dubin M.D.   On: 01/29/2020 14:06   DG CHEST PORT 1 VIEW  Result Date: 02/01/2020 CLINICAL DATA:  COVID-19 positive patient.  Shortness  of breath. EXAM: PORTABLE CHEST 1 VIEW COMPARISON:  Single-view of the chest 01/29/2020 and 10/23/2019. FINDINGS: There is new patchy bilateral airspace disease best seen in the upper lung zones and periphery of the right lung base. Cardiomegaly and vascular congestion again noted. IMPRESSION: New patchy bilateral airspace disease most consistent with pneumonia. Chronic cardiomegaly and vascular congestion.  Electronically Signed   By: Inge Rise M.D.   On: 02/01/2020 13:50   DG Chest Portable 1 View  Result Date: 01/29/2020 CLINICAL DATA:  Dyspnea, fever, nausea and vomiting. EXAM: PORTABLE CHEST 1 VIEW COMPARISON:  Single-view of the chest 06/24/2020 and 08/13/2019. CT chest 10/05/2019. FINDINGS: There is cardiomegaly and pulmonary vascular congestion. No consolidative process, pneumothorax or effusion is identified. No acute or focal bony abnormality. IMPRESSION: Cardiomegaly and pulmonary vascular congestion. Electronically Signed   By: Inge Rise M.D.   On: 01/29/2020 09:13     Phillips Climes M.D on 02/01/2020 at 3:28 PM    [facility] -  Office  [phone]

## 2020-02-02 ENCOUNTER — Inpatient Hospital Stay (HOSPITAL_COMMUNITY): Payer: Medicaid Other

## 2020-02-02 LAB — CBC WITH DIFFERENTIAL/PLATELET
Abs Immature Granulocytes: 0.03 10*3/uL (ref 0.00–0.07)
Basophils Absolute: 0 10*3/uL (ref 0.0–0.1)
Basophils Relative: 0 %
Eosinophils Absolute: 0 10*3/uL (ref 0.0–0.5)
Eosinophils Relative: 0 %
HCT: 36.6 % (ref 36.0–46.0)
Hemoglobin: 11.2 g/dL — ABNORMAL LOW (ref 12.0–15.0)
Immature Granulocytes: 1 %
Lymphocytes Relative: 27 %
Lymphs Abs: 1.6 10*3/uL (ref 0.7–4.0)
MCH: 27.6 pg (ref 26.0–34.0)
MCHC: 30.6 g/dL (ref 30.0–36.0)
MCV: 90.1 fL (ref 80.0–100.0)
Monocytes Absolute: 0.7 10*3/uL (ref 0.1–1.0)
Monocytes Relative: 11 %
Neutro Abs: 3.8 10*3/uL (ref 1.7–7.7)
Neutrophils Relative %: 61 %
Platelets: 224 10*3/uL (ref 150–400)
RBC: 4.06 MIL/uL (ref 3.87–5.11)
RDW: 13.4 % (ref 11.5–15.5)
WBC: 6.1 10*3/uL (ref 4.0–10.5)
nRBC: 0 % (ref 0.0–0.2)

## 2020-02-02 LAB — COMPREHENSIVE METABOLIC PANEL
ALT: 12 U/L (ref 0–44)
AST: 16 U/L (ref 15–41)
Albumin: 2.5 g/dL — ABNORMAL LOW (ref 3.5–5.0)
Alkaline Phosphatase: 81 U/L (ref 38–126)
Anion gap: 9 (ref 5–15)
BUN: 16 mg/dL (ref 6–20)
CO2: 22 mmol/L (ref 22–32)
Calcium: 8.8 mg/dL — ABNORMAL LOW (ref 8.9–10.3)
Chloride: 110 mmol/L (ref 98–111)
Creatinine, Ser: 1.06 mg/dL — ABNORMAL HIGH (ref 0.44–1.00)
GFR calc Af Amer: >60 mL/min (ref >60)
GFR calc non Af Amer: >60 mL/min (ref >60)
Glucose, Bld: 235 mg/dL — ABNORMAL HIGH (ref 70–99)
Potassium: 4 mmol/L (ref 3.5–5.1)
Sodium: 141 mmol/L (ref 135–145)
Total Bilirubin: 0.5 mg/dL (ref 0.3–1.2)
Total Protein: 6.3 g/dL — ABNORMAL LOW (ref 6.5–8.1)

## 2020-02-02 LAB — ECHOCARDIOGRAM LIMITED
Height: 68 in
Weight: 5975.35 oz

## 2020-02-02 LAB — GLUCOSE, CAPILLARY
Glucose-Capillary: 191 mg/dL — ABNORMAL HIGH (ref 70–99)
Glucose-Capillary: 227 mg/dL — ABNORMAL HIGH (ref 70–99)
Glucose-Capillary: 288 mg/dL — ABNORMAL HIGH (ref 70–99)
Glucose-Capillary: 344 mg/dL — ABNORMAL HIGH (ref 70–99)

## 2020-02-02 LAB — CULTURE, BLOOD (ROUTINE X 2): Special Requests: ADEQUATE

## 2020-02-02 LAB — MAGNESIUM: Magnesium: 1.8 mg/dL (ref 1.7–2.4)

## 2020-02-02 LAB — FERRITIN: Ferritin: 447 ng/mL — ABNORMAL HIGH (ref 11–307)

## 2020-02-02 LAB — D-DIMER, QUANTITATIVE: D-Dimer, Quant: 1.23 ug/mL-FEU — ABNORMAL HIGH (ref 0.00–0.50)

## 2020-02-02 LAB — C-REACTIVE PROTEIN: CRP: 1 mg/dL — ABNORMAL HIGH (ref <1.0)

## 2020-02-02 MED ORDER — INSULIN GLARGINE 100 UNIT/ML ~~LOC~~ SOLN
40.0000 [IU] | Freq: Every day | SUBCUTANEOUS | Status: AC
  Administered 2020-02-02: 40 [IU] via SUBCUTANEOUS
  Filled 2020-02-02: qty 0.4

## 2020-02-02 MED ORDER — CARVEDILOL 25 MG PO TABS
25.0000 mg | ORAL_TABLET | Freq: Two times a day (BID) | ORAL | Status: DC
  Administered 2020-02-02 – 2020-02-07 (×11): 25 mg via ORAL
  Filled 2020-02-02 (×11): qty 1

## 2020-02-02 MED ORDER — INSULIN GLARGINE 100 UNIT/ML ~~LOC~~ SOLN: 60.0000 [IU] | Freq: Every day | SUBCUTANEOUS | Status: DC

## 2020-02-02 MED ORDER — INSULIN GLARGINE 100 UNIT/ML ~~LOC~~ SOLN
55.0000 [IU] | Freq: Every day | SUBCUTANEOUS | Status: DC
  Administered 2020-02-02: 55 [IU] via SUBCUTANEOUS
  Filled 2020-02-02 (×2): qty 0.55

## 2020-02-02 NOTE — Evaluation (Signed)
Physical Therapy Evaluation Patient Details Name: [name] MRN: [id number] DOB: 1971/03/17 Today's Date: 02/02/2020   History of Present Illness  [name] is a 49 y.o. female with medical history significant of systolic CHF, lupus, diabetes mellitus type 2 with gastroparesis, and morbid obesity presents with complaints of shortness of breath and nausea over the last 5 days. Upon admission into the hospital patient was noted to be febrile up to 103 F, heart rates elevated up to 118, respiration 15-24, blood pressures 137/91-188/88, and O2 saturations currently maintained on on room air.  With ambulation patient's O2 saturations dropped to 89%; Covid screening was positive  Clinical Impression   Pt admitted with above diagnosis. PTA pt reports being mod independent with ADL and functional mobility using SPC, living alone. Pt currently presenting with overall weakness and decreased activity tolerance. Pt comleting room level mobility using SPC with close minguard-minA throughout (+2 safety/equipment). She currently requires overall minA for ADL. Pt requiring increased time and seated rest throughout mobility/ADL tasks during session. Initiated education on importance of energy conservation/activity progression. Pt initially on 1L O2 start of session, with exertion/activity SpO2 tending to decreased to low-mid 80s (lowest noted 83%) requiring up to 3L and cues for pursed lip breathing with activity to return O2 sats to >90%. HR in the 70s-max 95. Pt currently with functional limitations due to the deficits listed below (see PT Problem List). Pt will benefit from skilled PT to increase their independence and safety with mobility to allow discharge to the venue listed below.       Follow Up Recommendations Home health PT    Equipment Recommendations  Rolling walker with 5" wheels (Bariatric)    Recommendations for Other Services       Precautions / Restrictions Precautions Precautions:  Fall Precaution Comments: monitor O2 sats Restrictions Weight Bearing Restrictions: No      Mobility  Bed Mobility Overal bed mobility: Needs Assistance Bed Mobility: Supine to Sit     Supine to sit: Min assist;HOB elevated Sit to supine: Min guard;HOB elevated   General bed mobility comments: for tunk elevation, assist for lines  Transfers Overall transfer level: Needs assistance Equipment used: Straight cane Transfers: Sit to/from Stand Sit to Stand: Min guard;Min assist;+2 safety/equipment         General transfer comment: increased time/effort, occasionally requiring x2 attempts to achieve full standing, completed multiple sit<>stands throughout session - improved with repetition   Ambulation/Gait Ambulation/Gait assistance: Min guard;+2 physical assistance Gait Distance (Feet): 15 Feet (x2) Assistive device: Straight cane Gait Pattern/deviations: Step-through pattern;Decreased step length - right;Decreased step length - left     General Gait Details: Slow, guarded steps; close guard for safety  Stairs            Wheelchair Mobility    Modified Rankin (Stroke Patients Only)       Balance Overall balance assessment: Needs assistance Sitting-balance support: Feet supported Sitting balance-Leahy Scale: Good     Standing balance support: Single extremity supported;During functional activity Standing balance-Leahy Scale: Fair Standing balance comment: typically dependent on at least single UE support                              Pertinent Vitals/Pain Pain Assessment: Faces Faces Pain Scale: Hurts a little bit Pain Location: generalized malaise Pain Descriptors / Indicators: Aching Pain Intervention(s): Monitored during session    Home Living Family/patient expects to be discharged to:: Private  residence Living Arrangements: Alone Available Help at Discharge: Family Type of Home: House Home Access: Stairs to enter Entrance  Stairs-Rails: Can reach both;Left;Right (railing only at the top) Technical brewer of Steps: 6; 8 - 4 to landing and additional 4 Home Layout: One level        Prior Function Level of Independence: Independent with assistive device(s)         Comments: uses SPC     Hand Dominance   Dominant Hand: Right    Extremity/Trunk Assessment   Upper Extremity Assessment Upper Extremity Assessment: Defer to OT evaluation    Lower Extremity Assessment Lower Extremity Assessment: Generalized weakness (and large body habitus)       Communication   Communication: Other (comment);No difficulties (soft spoken)  Cognition Arousal/Alertness: Awake/alert Behavior During Therapy: Flat affect Overall Cognitive Status: Within Functional Limits for tasks assessed                                        General Comments General comments (skin integrity, edema, etc.): session conducted on 1-3 L supplemental O2; O2 sats decr to as low as 83% with activity; incr to 3 L when needed    Exercises     Assessment/Plan    PT Assessment Patient needs continued PT services  PT Problem List Decreased strength;Decreased range of motion;Decreased activity tolerance;Decreased balance;Decreased mobility;Decreased knowledge of use of DME;Decreased safety awareness;Cardiopulmonary status limiting activity       PT Treatment Interventions DME instruction;Gait training;Stair training;Functional mobility training;Therapeutic activities;Therapeutic exercise;Patient/family education    PT Goals (Current goals can be found in the Care Plan section)  Acute Rehab PT Goals Patient Stated Goal: regain strength/independence  PT Goal Formulation: With patient Time For Goal Achievement: 02/16/20 Potential to Achieve Goals: Good    Frequency Min 3X/week   Barriers to discharge        Co-evaluation PT/OT/SLP Co-Evaluation/Treatment: Yes Reason for Co-Treatment: For patient/therapist  safety PT goals addressed during session: Mobility/safety with mobility OT goals addressed during session: ADL's and self-care       AM-PAC PT "6 Clicks" Mobility  Outcome Measure Help needed turning from your back to your side while in a flat bed without using bedrails?: A Little Help needed moving from lying on your back to sitting on the side of a flat bed without using bedrails?: A Little Help needed moving to and from a bed to a chair (including a wheelchair)?: A Little Help needed standing up from a chair using your arms (e.g., wheelchair or bedside chair)?: A Little Help needed to walk in hospital room?: A Lot Help needed climbing 3-5 steps with a railing? : A Lot 6 Click Score: 16    End of Session Equipment Utilized During Treatment: Oxygen Activity Tolerance: Other (comment) (desats with activity) Patient left: Other (comment) (continuing work with OT at sink)   PT Visit Diagnosis: Unsteadiness on feet (R26.81);Other abnormalities of gait and mobility (R26.89) (Decr functional capacity)    Time: 1533-1600 PT Time Calculation (min) (ACUTE ONLY): 27 min   Charges:   PT Evaluation $PT Eval Moderate Complexity: 1 Mod          Roney [place], [place]  Acute Rehabilitation Services Pager [phone] Office [phone]   [place] 02/02/2020, 5:52 PM

## 2020-02-02 NOTE — Progress Notes (Signed)
°  Echocardiogram 2D Echocardiogram has been performed.  [name] F 02/02/2020, 11:20 AM

## 2020-02-02 NOTE — Evaluation (Signed)
Occupational Therapy Evaluation Patient Details Name: [name] MRN: [id number] DOB: [birth year] Today's Date: 02/02/2020    History of Present Illness [name] is a 49 y.o. female with medical history significant of systolic CHF, lupus, diabetes mellitus type 2 with gastroparesis, and morbid obesity presents with complaints of shortness of breath and nausea over the last 5 days. Upon admission into the hospital patient was noted to be febrile up to 103 F, heart rates elevated up to 118, respiration 15-24, blood pressures 137/91-188/88, and O2 saturations currently maintained on on room air.  With ambulation patient's O2 saturations dropped to 89%; Covid screening was positive   Clinical Impression   This 49 y/o female presents with the above. PTA pt reports being mod independent with ADL and functional mobility using SPC, living alone. Pt currently presenting with overall weakness and decreased activity tolerance. Pt comleting room level mobility using SPC with close minguard-minA throughout (+2 safety/equipment). She currently requires overall minA for ADL. Pt requiring increased time and seated rest throughout mobility/ADL tasks during session. Initiated education on importance of energy conservation/activity progression. Pt initially on 1L O2 start of session, with exertion/activity SpO2 tending to decreased to low-mid 80s (lowest noted 83%) requiring up to 3L and cues for pursed lip breathing with activity to return O2 sats to >90%. HR in the 70s-max 95. Pt to benefit from continued acute OT services, currently recommend follow up [facility] services (pending progress) after discharge to maximize her safety and independence with ADL and moblity. If pt with slower progress may need to consider post acute rehab.     Follow Up Recommendations  Home health OT;Supervision/Assistance - 24 hour (pending progress )    Equipment Recommendations  Tub/shower seat           Precautions /  Restrictions Precautions Precautions: Fall Precaution Comments: monitor O2 sats Restrictions Weight Bearing Restrictions: No      Mobility Bed Mobility Overal bed mobility: Needs Assistance Bed Mobility: Supine to Sit;Sit to Supine     Supine to sit: Min assist;HOB elevated Sit to supine: Min guard;HOB elevated   General bed mobility comments: for tunk elevation, assist for lines  Transfers Overall transfer level: Needs assistance Equipment used: Straight cane Transfers: Sit to/from Stand Sit to Stand: Min guard;Min assist;+2 safety/equipment         General transfer comment: increased time/effort, occasionally requiring x2 attempts to achieve full standing, completed multiple sit<>stands throughout session - improved with repetition     Balance Overall balance assessment: Needs assistance Sitting-balance support: Feet supported Sitting balance-Leahy Scale: Good     Standing balance support: Single extremity supported;During functional activity Standing balance-Leahy Scale: Fair Standing balance comment: typically dependent on at least single UE support                            ADL either performed or assessed with clinical judgement   ADL Overall ADL's : Needs assistance/impaired Eating/Feeding: Modified independent;Sitting   Grooming: Set up;Supervision/safety;Sitting;Wash/dry face   Upper Body Bathing: Min guard;Set up;Sitting Upper Body Bathing Details (indicate cue type and reason): seated at sink Lower Body Bathing: Minimal assistance;Sitting/lateral leans;Sit to/from stand   Upper Body Dressing : Set up;Sitting   Lower Body Dressing: Minimal assistance;Sit to/from stand Lower Body Dressing Details (indicate cue type and reason): for balance  Toilet Transfer: Minimal assistance;+2 for safety/equipment;Ambulation;Regular Toilet;Grab bars Toilet Transfer Details (indicate cue type and reason): amb with [facility] Toileting- Clothing Manipulation  and  Hygiene: Minimal assistance;Sitting/lateral lean;Sit to/from stand Toileting - Clothing Manipulation Details (indicate cue type and reason): pt performing anterior/posterior pericare with assist for gown management, performing clothing management with assist for balance      Functional mobility during ADLs: Minimal assistance;+2 for safety/equipment;Cane General ADL Comments: pt with weakness, decreased activity tolerance      Vision         Perception     Praxis      Pertinent Vitals/Pain       Hand Dominance Right   Extremity/Trunk Assessment Upper Extremity Assessment Upper Extremity Assessment: Generalized weakness   Lower Extremity Assessment Lower Extremity Assessment: Defer to PT evaluation       Communication Communication Communication: Other (comment);No difficulties (soft spoken)   Cognition Arousal/Alertness: Awake/alert Behavior During Therapy: Flat affect Overall Cognitive Status: Within Functional Limits for tasks assessed                                     General Comments       Exercises     Shoulder Instructions      Home Living Family/patient expects to be discharged to:: Private residence Living Arrangements: Alone   Type of Home: House Home Access: Stairs to enter [employer] of Steps: 6; 8 - 4 to landing and additional 4 Entrance Stairs-Rails: Can reach both;Left;Right (railing only at the top) Home Layout: One level     Bathroom Shower/Tub: Tub/shower unit                    Prior Functioning/Environment Level of Independence: Independent with assistive device(s)        Comments: uses SPC        OT Problem List: Decreased strength;Decreased range of motion;Decreased activity tolerance;Impaired balance (sitting and/or standing);Cardiopulmonary status limiting activity;Obesity;Decreased knowledge of use of DME or AE;Decreased safety awareness;Decreased knowledge of precautions      OT  Treatment/Interventions: Self-care/ADL training;Therapeutic exercise;Energy conservation;DME and/or AE instruction;Therapeutic activities;Patient/family education;Balance training    OT Goals(Current goals can be found in the care plan section) Acute Rehab OT Goals Patient Stated Goal: regain strength/independence  OT Goal Formulation: With patient Time For Goal Achievement: 02/16/20 Potential to Achieve Goals: Good  OT Frequency: Min 2X/week   Barriers to D/C:            Co-evaluation PT/OT/SLP Co-Evaluation/Treatment: Yes Reason for Co-Treatment: For patient/therapist safety;To address functional/ADL transfers   OT goals addressed during session: ADL's and self-care      AM-PAC OT "6 Clicks" Daily Activity     Outcome Measure Help from another person eating meals?: None Help from another person taking care of personal grooming?: A Little Help from another person toileting, which includes using toliet, bedpan, or urinal?: A Little Help from another person bathing (including washing, rinsing, drying)?: A Little Help from another person to put on and taking off regular upper body clothing?: A Little Help from another person to put on and taking off regular lower body clothing?: A Little 6 Click Score: 19   End of Session Equipment Utilized During Treatment: Oxygen [facility]) Nurse Communication: Mobility status  Activity Tolerance: Patient tolerated treatment well Patient left: in bed;with call bell/phone within reach  OT Visit Diagnosis: Other abnormalities of gait and mobility (R26.89);Other (comment);Muscle weakness (generalized) (M62.81) (decreased activity tolerance )                Time:  2637-8588 OT Time Calculation (min): 57 min Charges:  OT General Charges $OT Visit: 1 Visit OT Evaluation $OT Eval Moderate Complexity: 1 Mod OT Treatments $Self Care/Home Management : 23-37 mins  Lou Cal, OT Acute Rehabilitation Services Pager [phone] Office  [place] 02/02/2020, 5:30 PM

## 2020-02-02 NOTE — Progress Notes (Signed)
PROGRESS NOTE                                                                                                                                                                                                             Patient Demographics:    [name], is a 49 y.o. female, DOB - 10/20/1970, XBJ:[id number]  Admit date - 01/28/2020   Admitting Physician Norval Morton, MD  Outpatient Primary MD for the patient is Bartholome Bill, MD  LOS - 4   Chief Complaint  Patient presents with  . COVID like symptoms       Brief Narrative   HPI: [name] is a 49 y.o. female with medical history significant of systolic CHF, lupus, diabetes mellitus type 2 with gastroparesis, and morbid obesity presents with complaints of shortness of breath and nausea over the last 5 days.  Reports having associated symptoms of subjective fever, chills, cough, congestion, chest tightness, headache, generalized body aches, joint pain, epigastric abdominal pain, and vomiting.  Emesis has been nonbloody in appearance.  Denies having any diarrhea, dysuria, or urinary frequency.  Since symptoms started she has been unable to take any of her medications due to her nausea and vomiting she is followed by Dr. Einar Gip of cardiology in the outpatient setting.  Last EF noted to be around 45 - 50% by echocardiogram from 05/2018.  Patient reports that she did not get her Covid vaccine as her doctors want sure if she should take it.  ED Course: Upon admission into the hospital patient was noted to be febrile up to 103 F, heart rates elevated up to 118, respiration 15-24, blood pressures 137/91-188/88, and O2 saturations currently maintained on on room air.  With ambulation patient's O2 saturations dropped to 89%.  Labs revealed hemoglobin 11.6 and glucose 163.  CT scan of the abdomen pelvis revealed signs of a multifocal pneumonia concerning for Covid.  Covid 19 screening was positive.   Patient had been given Dilaudid, GI cocktail, Zofran, and Protonix.  TRH called to admit.    Subjective:    [name] today reports her nausea and vomiting has improved, but still complains of significant cough, and abdominal/chest pain due to cough and vomiting.   Assessment  & Plan :    Principal Problem:   Pneumonia due to COVID-19 virus Active  Problems:   SLE   HTN (hypertension)   Chronic systolic CHF (congestive heart failure) ([place])   Morbid obesity ([place])   Diabetes ([place])   Sepsis secondary pneumonia due to COVID-19: -  Acute.  Patient presented febrile up to 103 F with tachycardia and tachypnea.   -Remains with no oxygen requirement this morning. -Remains with significant cough, dyspnea with minimal exertion, continue with IV remdesivir and steroids. -Empiric antibiotics of Rocephin and azithromycin due to immunocompromises status , procalcitonin within normal limit, does not appear to be bacterial pneumonia, have stopped her antibiotics . -Vitamin C and zinc -Tylenol as needed for fever -Check inflammatory markers stat and monitor daily COVID-19 Labs  Recent Labs    01/31/20 0325 02/01/20 1337 02/02/20 0607  DDIMER 1.24* 1.20* 1.23*  FERRITIN 536* 518* 447*  CRP 4.5* 1.5* 1.0*    Lab Results  Component Value Date   SARSCOV2NAA POSITIVE (A) 01/29/2020   [place] NEGATIVE 10/23/2019   [place] NEGATIVE 08/24/2019   [place] NEGATIVE 08/12/2019     Corynebacterium UTI/bacteremia -Patient is immunocompromise, I have discussed with ID Dr. Drucilla Schmidt, even though Corynebacterium is uncommon pathogen for UTI, but given her bacteremia need to be treated, recommendation is 14 days of antibiotics, can continue with IV vancomycin during hospital stay, this can be transitioned to Zyvox upon discharge with goal of 14 days treatment. -Follow-up blood cultures, so far no growth to date -D echo was obtained, discussed finding with Dr. Einar Gip, no evidence of  vegetation currently.  Chronic systolic CHF: Chronic.   - Patient does not appear to be grossly fluid overloaded at this time.  Last EF noted to be 45 to 50% back in 2019.  Initially with her nausea and vomiting, I will hold on any diuresis. -Strict intake and output -Repeat 2D echo showing EF 45%, which is around her baseline, now blood pressure and kidney function has improved we will resume back on Coreg, and will add her medications gradually  Hypertension -Home medications include amlodipine 5 mg daily, BiDil 2 tablets 3 times daily, Coreg 25 mg twice daily, Entresto twice daily, spironolactone 50 mg daily, and torsemide 20 mg daily.   -Blood pressure is low, so all this regimen has been stopped . -Pressure started to increase, but overall on the normal, so we will hold on resuming her medication today.  AKI  -Secondary to volume depletion from nausea and vomiting, and hypotension, this has responded to IV fluids .  Pain improved from 1.68> 1.3. -hold nephrotoxic medications . -Avoid hypotension  Diabetes mellitus type 2:  -  A1c this admission is 10.5, dose of insulin at home, NPH insulin 1 7 units daily, Byetta 10 mcg twice daily, and metformin XR 1000 mg twice daily, Lantus was increased to 7 units this morning, Lantus regimen has changed to 60 units in the morning, 40 in the evening . -Continue with sliding scale, and 6 units of NovoLog before meals. -Hypoglycemic protocol -Hold Metformin  -Continue Byetta 10 mcg twice daily -CBGs before every meal with moderate SSI  Systemic Lupus: Patient is on Imuran 150 mg twice daily, belimumab 200 mg q. weekly on Tuesday, and prednisone. -Continue Imuran  Anxiety: Home medications include Wellbutrin 300 mg daily and Xanax 0.5 mg as needed. -Continue current home regimen  Hypothyroidism: Home medication regimen includes levothyroxine 50 mcg daily. -Continue levothyroxine  Hyperlipidemia -Continue pravastatin  GERD: Home  medications include Pepcid 20 mg twice daily and Carafate 1 g twice daily. -Continue home regimen  Morbid obesity: BMI 56.15 kg/m -Will need continued education on need weight loss   COVID-19 Labs  Recent Labs    01/31/20 0325 02/01/20 1337 02/02/20 0607  DDIMER 1.24* 1.20* 1.23*  FERRITIN 536* 518* 447*  CRP 4.5* 1.5* 1.0*    Lab Results  Component Value Date   SARSCOV2NAA POSITIVE (A) 01/29/2020   [place] NEGATIVE 10/23/2019   [place] NEGATIVE 08/24/2019   [place] NEGATIVE 08/12/2019     Code Status : Full  Family Communication  : D/W patient  Disposition Plan  :  Status is: Inpatient  Remains inpatient appropriate because:IV treatments appropriate due to intensity of illness or inability to take PO   Dispo: The patient is from: Home              Anticipated d/c is to: Home              Anticipated d/c date is: > 3 days              Patient currently is not medically stable to d/c.        Barriers For Discharge :   Consults  :  none  Procedures  : None  DVT Prophylaxis  :  [place] lovenox  Lab Results  Component Value Date   PLT 224 02/02/2020    Antibiotics  :    Anti-infectives (From admission, onward)   Start     Dose/Rate Route Frequency Ordered Stop   01/31/20 1400  Ampicillin-Sulbactam (UNASYN) 3 g in sodium chloride 0.9 % 100 mL IVPB  Status:  Discontinued        3 g 200 mL/hr over 30 Minutes Intravenous Every 6 hours 01/31/20 1315 02/01/20 1316   01/31/20 0900  vancomycin (VANCOREADY) IVPB 1250 mg/250 mL     Discontinue     1,250 mg 166.7 mL/hr over 90 Minutes Intravenous Every 12 hours 01/30/20 2024     01/30/20 2100  vancomycin (VANCOCIN) 2,500 mg in sodium chloride 0.9 % 500 mL IVPB        2,500 mg 250 mL/hr over 120 Minutes Intravenous  Once 01/30/20 2024 01/31/20 0235   01/30/20 1000  remdesivir 100 mg in sodium chloride 0.9 % 100 mL IVPB       "Followed by" Linked Group Details   100 mg 200 mL/hr over 30 Minutes  Intravenous Daily 01/29/20 1546 02/02/20 1209   01/29/20 2200  hydroxychloroquine (PLAQUENIL) tablet 200 mg     Discontinue     200 mg Oral 2 times daily 01/29/20 1546     01/29/20 2000  cefTRIAXone (ROCEPHIN) 2 g in sodium chloride 0.9 % 100 mL IVPB  Status:  Discontinued        2 g 200 mL/hr over 30 Minutes Intravenous Every 24 hours 01/29/20 1850 01/31/20 0732   01/29/20 2000  azithromycin (ZITHROMAX) 500 mg in sodium chloride 0.9 % 250 mL IVPB  Status:  Discontinued        500 mg 250 mL/hr over 60 Minutes Intravenous Every 24 hours 01/29/20 1850 01/31/20 0732   01/29/20 1545  remdesivir 200 mg in sodium chloride 0.9% 250 mL IVPB       "Followed by" Linked Group Details   200 mg 580 mL/hr over 30 Minutes Intravenous Once 01/29/20 1546 01/29/20 1856        Objective:   Vitals:   02/01/20 1202 02/01/20 2103 02/02/20 0602 02/02/20 1214  BP: 128/82 (!) 148/81 (!) 142/77 (!) 109/57  Pulse: 90 79 83  74  Resp: (!) 23 20 (!) 22 20  Temp: (!) 97.5 F (36.4 C) 97.6 F (36.4 C) 98.1 F (36.7 C) 98.9 F (37.2 C)  TempSrc: Oral Oral Oral Oral  SpO2: 93% 94% 94% 91%  Weight:   (!) 169.4 kg   Height:        Wt Readings from Last 3 Encounters:  02/02/20 (!) 169.4 kg  01/21/20 (!) 155.1 kg  10/23/19 (!) 160 kg     Intake/Output Summary (Last 24 hours) at 02/02/2020 1550 Last data filed at 02/02/2020 1500 Gross per 24 hour  Intake 582 ml  Output 3 ml  Net 579 ml     Physical Exam  Awake Alert, Oriented X 3, No new F.N deficits, Normal affect Symmetrical Chest wall movement, Good air movement bilaterally, CTAB RRR,No Gallops,Rubs or new Murmurs, No Parasternal Heave +ve B.Sounds, Abd Soft, No tenderness, No rebound - guarding or rigidity. No Cyanosis, Clubbing or edema, No new Rash or bruise        Data Review:    CBC Recent Labs  Lab 01/28/20 2215 01/30/20 0013 01/31/20 0325 02/01/20 1337 02/02/20 0607  WBC 6.7 4.8 2.8* 8.0 6.1  HGB 11.6* 11.2* 10.7* 11.4* 11.2*    HCT 36.7 35.0* 33.6* 37.4 36.6  PLT 215 198 201 225 224  MCV 87.8 87.5 88.4 90.1 90.1  MCH 27.8 28.0 28.2 27.5 27.6  MCHC 31.6 32.0 31.8 30.5 30.6  RDW 13.2 13.2 13.2 13.5 13.4  LYMPHSABS 1.3 0.7 0.9 0.7 1.6  MONOABS 0.5 0.1 0.3 0.5 0.7  EOSABS 0.0 0.0 0.0 0.0 0.0  BASOSABS 0.0 0.0 0.0 0.0 0.0    Chemistries  Recent Labs  Lab 01/28/20 2215 01/30/20 0013 01/31/20 0325 02/01/20 1337 02/02/20 0607  NA 137 135 136 137 141  K 3.7 4.3 4.7 4.4 4.0  CL 107 107 108 109 110  CO2 19* 18* 19* 19* 22  GLUCOSE 163* 258* 374* 415* 235*  BUN 9 15 25* 18 16  CREATININE 0.98 1.26* 1.68* 1.30* 1.06*  CALCIUM 8.5* 8.3* 8.3* 8.6* 8.8*  MG  --  1.8 1.9 1.9 1.8  AST 47* 60* 27 22 16   ALT 14 16 15 13 12   ALKPHOS 77 70 70 79 81  BILITOT 0.5 0.5 0.5 0.6 0.5   ------------------------------------------------------------------------------------------------------------------ No results for input(s): CHOL, HDL, LDLCALC, TRIG, CHOLHDL, LDLDIRECT in the last 72 hours.  Lab Results  Component Value Date   HGBA1C 10.5 (H) 01/30/2020   ------------------------------------------------------------------------------------------------------------------ No results for input(s): TSH, T4TOTAL, T3FREE, THYROIDAB in the last 72 hours.  Invalid input(s): FREET3 ------------------------------------------------------------------------------------------------------------------ Recent Labs    02/01/20 1337 02/02/20 0607  FERRITIN 518* 447*    Coagulation profile No results for input(s): INR, PROTIME in the last 168 hours.  Recent Labs    02/01/20 1337 02/02/20 0607  DDIMER 1.20* 1.23*    Cardiac Enzymes No results for input(s): CKMB, TROPONINI, MYOGLOBIN in the last 168 hours.  Invalid input(s): CK ------------------------------------------------------------------------------------------------------------------    Component Value Date/Time   BNP 35.8 01/29/2020 1915    Inpatient  Medications  Scheduled Meds: . albuterol  2 puff Inhalation Q6H  . vitamin C  500 mg Oral Daily  . azaTHIOprine  150 mg Oral BID  . buPROPion  300 mg Oral Daily  . carvedilol  25 mg Oral BID WC  . cetaphil  1 application Topical BID  . cycloSPORINE  1 drop Both Eyes BID  . dexamethasone  6 mg Oral Daily  .  enoxaparin (LOVENOX) injection  75 mg Subcutaneous Q24H  . famotidine  20 mg Oral BID  . feeding supplement (GLUCERNA SHAKE)  237 mL Oral TID BM  . hydroxychloroquine  200 mg Oral BID  . insulin aspart  0-15 Units Subcutaneous TID WC  . insulin aspart  0-5 Units Subcutaneous QHS  . insulin aspart  6 Units Subcutaneous TID WC  . insulin glargine  40 Units Subcutaneous QHS  . insulin glargine  55 Units Subcutaneous Daily  . levothyroxine  50 mcg Oral Q0600  . loratadine  10 mg Oral Daily  . mometasone-formoterol  2 puff Inhalation BID  . pantoprazole  40 mg Oral Daily  . pravastatin  10 mg Oral Daily  . pregabalin  75 mg Oral TID  . sodium chloride flush  3 mL Intravenous Q12H  . sucralfate  1 g Oral BID  . zinc sulfate  220 mg Oral Daily   Continuous Infusions: . sodium chloride Stopped (01/29/20 2205)  . vancomycin 166.7 mL/hr at 02/02/20 0941   PRN Meds:.sodium chloride, acetaminophen, ALPRAZolam, diclofenac Sodium, doxepin, fluticasone, guaiFENesin-dextromethorphan, ondansetron **OR** ondansetron (ZOFRAN) IV, oxyCODONE, promethazine, traMADol  Micro Results Recent Results (from the past 240 hour(s))  SARS Coronavirus 2 by RT PCR (hospital order, performed in [place] hospital lab) Nasopharyngeal Nasopharyngeal Swab     Status: Abnormal   Collection Time: 01/29/20  9:56 AM   Specimen: Nasopharyngeal Swab  Result Value Ref Range Status   SARS Coronavirus 2 POSITIVE (A) NEGATIVE Final    Comment: RESULT CALLED TO, READ BACK BY AND VERIFIED WITH: RN LOUIE CHILTON [id number] 01/29/20 KB (NOTE) SARS-CoV-2 target nucleic acids are DETECTED  SARS-CoV-2 RNA is generally  detectable in upper respiratory specimens  during the acute phase of infection.  Positive results are indicative  of the presence of the identified virus, but do not rule out bacterial infection or co-infection with other pathogens not detected by the test.  Clinical correlation with patient history and  other diagnostic information is necessary to determine patient infection status.  The expected result is negative.  Fact Sheet for Patients:   StrictlyIdeas.no   Fact Sheet for Healthcare Providers:   BankingDealers.co.za    This test is not yet approved or cleared by the [place] FDA and  has been authorized for detection and/or diagnosis of SARS-CoV-2 by FDA under an Emergency Use Authorization (EUA).  This EUA will remain in effect (meaning this tes t can be used) for the duration of  the COVID-19 declaration under Section 564(b)(1) of the Act, 21 U.S.C. section 360-bbb-3(b)(1), unless the authorization is terminated or revoked sooner.  Performed at [facility], [place] [address], [place], [place] [address]   Culture, Urine     Status: Abnormal   Collection Time: 01/29/20  2:06 PM   Specimen: Urine, Random  Result Value Ref Range Status   Specimen Description URINE, RANDOM  Final   Special Requests NONE  Final   Culture (A)  Final    >=100,000 COLONIES/mL DIPHTHEROIDS(CORYNEBACTERIUM SPECIES) Standardized susceptibility testing for this organism is not available. Performed at [facility], [place] [address] [place], [place] [address]    Report Status 01/30/2020 FINAL  Final  Culture, blood (Routine X 2) w Reflex to ID Panel     Status: Abnormal   Collection Time: 01/29/20  6:57 PM   Specimen: BLOOD  Result Value Ref Range Status   Specimen Description BLOOD RIGHT ANTECUBITAL  Final   Special Requests  Final    BOTTLES DRAWN AEROBIC AND ANAEROBIC Blood Culture adequate volume   Culture  Setup Time   Final    IN  BOTH AEROBIC AND ANAEROBIC BOTTLES GRAM POSITIVE RODS CRITICAL RESULT CALLED TO, READ BACK BY AND VERIFIED WITH: Salome [facility] 01/30/20 1938 JDW    Culture (A)  Final    CORYNEBACTERIUM STRIATUM ACTINOMYCES NEUII Standardized susceptibility testing for this organism is not available. Performed at [facility], [place] [address], [place], [place] [address]    Report Status 02/02/2020 FINAL  Final  Culture, blood (Routine X 2) w Reflex to ID Panel     Status: Abnormal   Collection Time: 01/29/20  7:05 PM   Specimen: BLOOD LEFT HAND  Result Value Ref Range Status   Specimen Description BLOOD LEFT HAND  Final   Special Requests   Final    BOTTLES DRAWN AEROBIC AND ANAEROBIC Blood Culture adequate volume   Culture  Setup Time   Final    ANAEROBIC BOTTLE ONLY GRAM POSITIVE RODS CRITICAL VALUE NOTED.  VALUE IS CONSISTENT WITH PREVIOUSLY REPORTED AND CALLED VALUE.    Culture (A)  Final    CORYNEBACTERIUM STRIATUM Standardized susceptibility testing for this organism is not available. Performed at [facility], [place] [address], [place], [place] [address]    Report Status 02/01/2020 FINAL  Final    Radiology Reports CT ABDOMEN PELVIS W CONTRAST  Result Date: 01/29/2020 CLINICAL DATA:  Fever, body aches, and sore throat for the past 2 days. Lower abdominal pain. COVID-19 positive. EXAM: CT ABDOMEN AND PELVIS WITH CONTRAST TECHNIQUE: Multidetector CT imaging of the abdomen and pelvis was performed using the standard protocol following bolus administration of intravenous contrast. CONTRAST:  153mL OMNIPAQUE IOHEXOL 300 MG/ML  SOLN COMPARISON:  CT abdomen pelvis dated October 23, 2019. FINDINGS: Lower chest: Small subpleural ground-glass densities at the lung bases with more focal consolidation versus atelectasis in the left lower lobe. Hepatobiliary: Heterogeneous hepatic parenchyma and ill-defined hypodense liver lesions are less conspicuous when compared to the prior study due to  different phase of contrast. No obvious significant interval change. No new focal liver abnormality. The gallbladder is unremarkable. No biliary dilatation. Pancreas: Unremarkable. No pancreatic ductal dilatation or surrounding inflammatory changes. Spleen: Normal in size without focal abnormality. Adrenals/Urinary Tract: Adrenal glands are unremarkable. Unchanged punctate calculus in the upper pole of the right kidney. No hydronephrosis. The bladder is unremarkable. Stomach/Bowel: Stomach is within normal limits. Appendix appears normal. No evidence of bowel wall thickening, distention, or inflammatory changes. Vascular/Lymphatic: No significant vascular findings are present. No enlarged abdominal or pelvic lymph nodes. Reproductive: Uterus and bilateral adnexa are unremarkable. Other: No abdominal wall hernia or abnormality. No abdominopelvic ascites. No pneumoperitoneum. Musculoskeletal: No acute or significant osseous findings. IMPRESSION: 1. No acute intra-abdominal process. 2. Small subpleural ground-glass densities at the lung bases with more focal consolidation versus atelectasis in the left lower lobe. Findings are suspicious for COVID-19 pneumonia given clinical history. 3. Heterogeneous hepatic parenchyma and ill-defined hypodense liver lesions are less conspicuous when compared to the prior study due to different phase of contrast, but there is no obvious significant interval change. 4. Unchanged nonobstructive right nephrolithiasis. Electronically Signed   By: Titus Dubin M.D.   On: 01/29/2020 14:06   DG CHEST PORT 1 VIEW  Result Date: 02/01/2020 CLINICAL DATA:  COVID-19 positive patient.  Shortness of breath. EXAM: PORTABLE CHEST 1 VIEW COMPARISON:  Single-view of the chest 01/29/2020 and 10/23/2019. FINDINGS: There  is new patchy bilateral airspace disease best seen in the upper lung zones and periphery of the right lung base. Cardiomegaly and vascular congestion again noted. IMPRESSION: New  patchy bilateral airspace disease most consistent with pneumonia. Chronic cardiomegaly and vascular congestion. Electronically Signed   By: Inge Rise M.D.   On: 02/01/2020 13:50   DG Chest Portable 1 View  Result Date: 01/29/2020 CLINICAL DATA:  Dyspnea, fever, nausea and vomiting. EXAM: PORTABLE CHEST 1 VIEW COMPARISON:  Single-view of the chest 06/24/2020 and 08/13/2019. CT chest 10/05/2019. FINDINGS: There is cardiomegaly and pulmonary vascular congestion. No consolidative process, pneumothorax or effusion is identified. No acute or focal bony abnormality. IMPRESSION: Cardiomegaly and pulmonary vascular congestion. Electronically Signed   By: Inge Rise M.D.   On: 01/29/2020 09:13   ECHOCARDIOGRAM LIMITED  Result Date: 02/02/2020    ECHOCARDIOGRAM LIMITED REPORT   Patient Name:   BRAILEY BUESCHER Date of Exam: 02/02/2020 Medical Rec #:  [id number]     Height:       68.0 in Accession #:    [id number]    Weight:       373.5 lb Date of Birth:  05-29-1971     BSA:          2.665 m Patient Age:    51 years      BP:           142/77 mmHg Patient Gender: F             HR:           74 bpm. Exam Location:  [facility] Procedure: Limited Echo, Cardiac Doppler and Color Doppler Indications:    Bacteremia 790.7/R78.81  History:        Patient has prior history of Echocardiogram examinations, most                 recent 05/11/2018. Risk Factors:Morbid Obesity. CV19 positive.  Sonographer:    [name] RDCS Referring Phys: [place]  Sonographer Comments: This was a limited echo to rule out endocarditis in a patient positive for CV19. IMPRESSIONS  1. Left ventricular ejection fraction, by estimation, is 40 to 45%. The left ventricle has mildly decreased function. The left ventricle demonstrates global hypokinesis. Left ventricular diastolic function could not be evaluated.  2. Right ventricular systolic function is normal. The right ventricular size is mildly enlarged. Tricuspid regurgitation  signal is inadequate for assessing PA pressure.  3. The mitral valve is normal in structure. Trivial mitral valve regurgitation.  4. The aortic valve is normal in structure. Aortic valve regurgitation is not visualized. No aortic stenosis is present. Conclusion(s)/Recommendation(s): No evidence of valvular vegetations on this transthoracic echocardiogram. Would recommend a transesophageal echocardiogram to exclude infective endocarditis if clinically indicated. FINDINGS  Left Ventricle: Left ventricular ejection fraction, by estimation, is 40 to 45%. The left ventricle has mildly decreased function. The left ventricle demonstrates global hypokinesis. The left ventricular internal cavity size was normal in size. There is  no left ventricular hypertrophy. Right Ventricle: The right ventricular size is mildly enlarged. No increase in right ventricular wall thickness. Right ventricular systolic function is normal. Tricuspid regurgitation signal is inadequate for assessing PA pressure. Left Atrium: Left atrial size was normal in size. Right Atrium: Right atrial size was normal in size. Pericardium: There is no evidence of pericardial effusion. Mitral Valve: The mitral valve is normal in structure. There is mild thickening of the mitral valve leaflet(s). Trivial mitral valve regurgitation. Tricuspid  Valve: The tricuspid valve is normal in structure. Tricuspid valve regurgitation is trivial. There is no evidence of tricuspid valve vegetation. Aortic Valve: The aortic valve is normal in structure. Aortic valve regurgitation is not visualized. No aortic stenosis is present. Pulmonic Valve: The pulmonic valve was not well visualized. Pulmonic valve regurgitation is trivial. Aorta: The aortic root is normal in size and structure. Venous: The inferior vena cava was not well visualized. IAS/Shunts: The interatrial septum was not well visualized.  LEFT VENTRICLE PLAX 2D LVIDd:         6.00 cm LVIDs:         4.50 cm LV PW:          1.00 cm LV IVS:        0.90 cm LVOT diam:     2.10 cm LVOT Area:     3.46 cm  LEFT ATRIUM         Index LA diam:    3.50 cm 1.31 cm/m   AORTA Ao Root diam: 3.10 cm Ao Asc diam:  3.50 cm  SHUNTS Systemic Diam: 2.10 cm Adrian Prows MD Electronically signed by Adrian Prows MD Signature Date/Time: 02/02/2020/1:01:46 PM    Final      Phillips Climes M.D on 02/02/2020 at 3:50 PM    [facility] -  Office  [phone]

## 2020-02-02 NOTE — Progress Notes (Signed)
Pt c/o IV site in R FA looking red and feeling sore. Site found looking red and a little swollen. IV running vancomycin stopped and IV removed. Last medicine administered prior to antibiotic was phenegran. IV team consulted for insertion of new IV. Will continue to monitor.

## 2020-02-03 LAB — COMPREHENSIVE METABOLIC PANEL
ALT: 10 U/L (ref 0–44)
AST: 13 U/L — ABNORMAL LOW (ref 15–41)
Albumin: 2.3 g/dL — ABNORMAL LOW (ref 3.5–5.0)
Alkaline Phosphatase: 75 U/L (ref 38–126)
Anion gap: 8 (ref 5–15)
BUN: 17 mg/dL (ref 6–20)
CO2: 20 mmol/L — ABNORMAL LOW (ref 22–32)
Calcium: 8.5 mg/dL — ABNORMAL LOW (ref 8.9–10.3)
Chloride: 109 mmol/L (ref 98–111)
Creatinine, Ser: 1.08 mg/dL — ABNORMAL HIGH (ref 0.44–1.00)
GFR calc Af Amer: >60 mL/min (ref >60)
GFR calc non Af Amer: >60 mL/min (ref >60)
Glucose, Bld: 262 mg/dL — ABNORMAL HIGH (ref 70–99)
Potassium: 4.1 mmol/L (ref 3.5–5.1)
Sodium: 137 mmol/L (ref 135–145)
Total Bilirubin: 0.6 mg/dL (ref 0.3–1.2)
Total Protein: 5.7 g/dL — ABNORMAL LOW (ref 6.5–8.1)

## 2020-02-03 LAB — GLUCOSE, CAPILLARY
Glucose-Capillary: 166 mg/dL — ABNORMAL HIGH (ref 70–99)
Glucose-Capillary: 214 mg/dL — ABNORMAL HIGH (ref 70–99)
Glucose-Capillary: 273 mg/dL — ABNORMAL HIGH (ref 70–99)
Glucose-Capillary: 253 mg/dL — ABNORMAL HIGH (ref 70–99)

## 2020-02-03 LAB — CBC WITH DIFFERENTIAL/PLATELET
Abs Immature Granulocytes: 0.03 10*3/uL (ref 0.00–0.07)
Basophils Absolute: 0 10*3/uL (ref 0.0–0.1)
Basophils Relative: 0 %
Eosinophils Absolute: 0 10*3/uL (ref 0.0–0.5)
Eosinophils Relative: 0 %
HCT: 35.1 % — ABNORMAL LOW (ref 36.0–46.0)
Hemoglobin: 10.7 g/dL — ABNORMAL LOW (ref 12.0–15.0)
Immature Granulocytes: 1 %
Lymphocytes Relative: 22 %
Lymphs Abs: 1.2 10*3/uL (ref 0.7–4.0)
MCH: 27.6 pg (ref 26.0–34.0)
MCHC: 30.5 g/dL (ref 30.0–36.0)
MCV: 90.7 fL (ref 80.0–100.0)
Monocytes Absolute: 0.5 10*3/uL (ref 0.1–1.0)
Monocytes Relative: 10 %
Neutro Abs: 3.6 10*3/uL (ref 1.7–7.7)
Neutrophils Relative %: 67 %
Platelets: 262 10*3/uL (ref 150–400)
RBC: 3.87 MIL/uL (ref 3.87–5.11)
RDW: 13.2 % (ref 11.5–15.5)
WBC: 5.3 10*3/uL (ref 4.0–10.5)
nRBC: 0 % (ref 0.0–0.2)

## 2020-02-03 LAB — MAGNESIUM: Magnesium: 1.8 mg/dL (ref 1.7–2.4)

## 2020-02-03 LAB — D-DIMER, QUANTITATIVE: D-Dimer, Quant: 1.17 ug/mL-FEU — ABNORMAL HIGH (ref 0.00–0.50)

## 2020-02-03 LAB — FERRITIN: Ferritin: 453 ng/mL — ABNORMAL HIGH (ref 11–307)

## 2020-02-03 LAB — C-REACTIVE PROTEIN: CRP: 0.9 mg/dL (ref <1.0)

## 2020-02-03 MED ORDER — INSULIN ASPART 100 UNIT/ML ~~LOC~~ SOLN
8.0000 [IU] | Freq: Three times a day (TID) | SUBCUTANEOUS | Status: DC
  Administered 2020-02-03 – 2020-02-06 (×12): 8 [IU] via SUBCUTANEOUS

## 2020-02-03 MED ORDER — TOPIRAMATE 25 MG PO TABS
75.0000 mg | ORAL_TABLET | Freq: Every day | ORAL | Status: DC
  Administered 2020-02-03 – 2020-02-06 (×4): 75 mg via ORAL
  Filled 2020-02-03 (×4): qty 3

## 2020-02-03 MED ORDER — INSULIN GLARGINE 100 UNIT/ML ~~LOC~~ SOLN
55.0000 [IU] | Freq: Two times a day (BID) | SUBCUTANEOUS | Status: DC
  Administered 2020-02-03 – 2020-02-07 (×9): 55 [IU] via SUBCUTANEOUS
  Filled 2020-02-03 (×9): qty 0.55

## 2020-02-03 MED ORDER — ASPIRIN EC 81 MG PO TBEC
81.0000 mg | DELAYED_RELEASE_TABLET | Freq: Every day | ORAL | Status: DC
  Administered 2020-02-03 – 2020-02-07 (×5): 81 mg via ORAL
  Filled 2020-02-03 (×5): qty 1

## 2020-02-03 MED ORDER — HYDROCOD POLST-CPM POLST ER 10-8 MG/5ML PO SUER
5.0000 mL | Freq: Two times a day (BID) | ORAL | Status: DC | PRN
  Administered 2020-02-03 – 2020-02-05 (×4): 5 mL via ORAL
  Filled 2020-02-03 (×4): qty 5

## 2020-02-03 NOTE — Progress Notes (Signed)
Pharmacy Antibiotic Note  [name] is a 49 y.o. female admitted on 01/28/2020 with SOB and positive for COVID-19. Pt growing diphtheroids in urine, now with gram positive rods in 1 of 4 blood cultures (currently unidentified). Pharmacy has been consulted for vancomycin dosing given that pt is chronically immunosuppressed. Cr up this morning, pt afebrile.  Blood culture 3/4 bottles grew corynebacterium striatum, sensitive to vancomycin.    WBC wnl, afeb,  Day #4 of Vancomycin.   Scr 1.08 stable down from peak of 1.68 on 7/2,  Mg 1.8  UOP= 0.2 ml/kg/hr +  urine occurrence 2x  Plan: Continue Vancomycin 1250mg  IV q12h Check vancomycin trough at [facility] tomorrow at 0830 AM.  Monitor clinical progress, renal function. Check steady state vancomycin trough per protocol if needed.     Height: 5\' 8"  (172.7 cm) Weight: (!) 169.4 kg (373 lb 7.4 oz) IBW/kg (Calculated) : 63.9  Temp (24hrs), Avg:98.2 F (36.8 C), Min:97.7 F (36.5 C), Max:98.9 F (37.2 C)  Recent Labs  Lab 01/28/20 2215 01/29/20 0950 01/30/20 0013 01/31/20 0325 02/01/20 1337 02/02/20 0607 02/03/20 0206  WBC   < >  --  4.8 2.8* 8.0 6.1 5.3  CREATININE   < >  --  1.26* 1.68* 1.30* 1.06* 1.08*  LATICACIDVEN  --  1.4  --   --   --   --   --    < > = values in this interval not displayed.    Estimated Creatinine Clearance: 105.5 mL/min (A) (by C-G formula based on SCr of 1.08 mg/dL (H)).    Allergies  Allergen Reactions  . Metoclopramide Other (See Comments)    Developed restless leg, akathisia type limb movements.   . Codeine Itching, Rash and Other (See Comments)  . Penicillins Itching    Has patient had a PCN reaction causing immediate rash, facial/tongue/throat swelling, SOB or lightheadedness with hypotension: No Has patient had a PCN reaction causing severe rash involving mucus membranes or skin necrosis: No Has patient had a PCN reaction that required hospitalization: No Has patient had a PCN reaction occurring  within the last 10 years:yes If all of the above answers are "NO", then may proceed with Cephalosporin use.  Marland Kitchen Hydrocodone Rash  . Percocet [Oxycodone-Acetaminophen] Hives and Rash    Tolerates dilaudid    Antimicrobials this admission: Remdesivir 6/30>>7/4 Azithro 6/30 >>7/2 Rocephin 6/30 >>7/2 Unasyn 7/2>>7/3 Vanc 7/1>>   Microbiology results: 6/30 BCx: 3/4 corynebacterium striatum 6/30 urine>>diptheroids 7/4 BCx x2: ngtd  Thank you for allowing pharmacy to be part of this patients care team. Nicole Cella, [facility] Pharmacist [phone] Please check AMION for all [place] phone numbers After 10:00 PM, call [place]  02/03/2020 11:36 AM

## 2020-02-03 NOTE — Progress Notes (Signed)
PROGRESS NOTE                                                                                                                                                                                                             Patient Demographics:    [name], [name] a 49 y.o. [name], [name], [name]  Admit date - 01/28/2020   Admitting Physician Norval Morton, MD  Outpatient Primary MD for the patient [name] Bartholome Bill, MD  LOS - 5   Chief Complaint  Patient presents with  . COVID like symptoms       Brief Narrative   HPI: [name] [name] a 49 y.o. [name] with medical history significant of systolic CHF, lupus, diabetes mellitus type 2 with gastroparesis, and morbid obesity presents with complaints of shortness of breath and nausea over the last 5 days.  Reports having associated symptoms of subjective fever, chills, cough, congestion, chest tightness, headache, generalized body aches, joint pain, epigastric abdominal pain, and vomiting.  Emesis has been nonbloody in appearance.  Denies having any diarrhea, dysuria, or urinary frequency.  Since symptoms started she has been unable to take any of her medications due to her nausea and vomiting she [name] followed by Dr. Einar Gip of cardiology in the outpatient setting.  Last EF noted to be around 45 - 50% by echocardiogram from 05/2018.  Patient reports that she did not get her Covid vaccine as her doctors want sure if she should take it.  ED Course: Upon admission into the hospital patient was noted to be febrile up to 103 F, heart rates elevated up to 118, respiration 15-24, blood pressures 137/91-188/88, and O2 saturations currently maintained on on room air.  With ambulation patient's O2 saturations dropped to 89%.  Labs revealed hemoglobin 11.6 and glucose 163.  CT scan of the abdomen pelvis revealed signs of a multifocal pneumonia concerning for Covid.  Covid 19 screening was positive.   Patient had been given Dilaudid, GI cocktail, Zofran, and Protonix.  TRH called to admit.    Subjective:    [name] today still reports some occasional nausea, but no vomiting, reports cough, it [name] improving, still reports some musculoskeletal chest pain related to cough.   Assessment  & Plan :    Principal Problem:   Pneumonia due to COVID-19 virus  Active Problems:   SLE   HTN (hypertension)   Chronic systolic CHF (congestive heart failure) ([name])   Morbid obesity ([name])   Diabetes ([name])   Sepsis /acute hypoxic respiratory failure due to secondary pneumonia due to COVID-19: -  Acute.  Patient presented febrile up to 103 F with tachycardia and tachypnea.   -Patient [name] morning on 1 to 2 L at rest, she did require up to 3 L oxygen with activity yesterday. -Remains with significant cough, dyspnea with minimal exertion, he was treated with IV remdesivir, last dose was 7/4, continue with steroids.-Empiric antibiotics of Rocephin and azithromycin due to immunocompromises status , procalcitonin within normal limit, does not appear to be bacterial pneumonia, have stopped her antibiotics . -Vitamin C and zinc -Tylenol as needed for fever -CRP has normalized which [name] reassuring COVID-19 Labs  Recent Labs    02/01/20 1337 02/02/20 0607 02/03/20 0206  DDIMER 1.20* 1.23* 1.17*  FERRITIN 518* 447* 453*  CRP 1.5* 1.0* 0.9    Lab Results  Component Value Date   SARSCOV2NAA POSITIVE (A) 01/29/2020   [place] NEGATIVE 10/23/2019   [place] NEGATIVE 08/24/2019   [place] NEGATIVE 08/12/2019     Corynebacterium UTI/bacteremia -Patient [name] immunocompromise, I have discussed with ID Dr. Drucilla Schmidt, even though Corynebacterium [name] uncommon pathogen for UTI, but given her bacteremia need to be treated, recommendation [name] 14 days of antibiotics, can continue with IV vancomycin during hospital stay, this can be transitioned to Zyvox upon discharge with goal of 14 days  treatment. -Follow-up blood cultures, so far no growth to date -D echo was obtained, discussed finding with Dr. Einar Gip, no evidence of vegetation on her 2D echo..  Chronic systolic CHF: Chronic.   - Patient does not appear to be grossly fluid overloaded at this time.  Last EF noted to be 45 to 50% back in 2019.  Initially with her nausea and vomiting, I will hold on any diuresis. -Strict intake and output -Repeat 2D echo showing EF 45%, which [name] around her baseline, now blood pressure and kidney function has improved we will resume back on Coreg, and will add her medications gradually  Hypertension -Home medications include amlodipine 5 mg daily, BiDil 2 tablets 3 times daily, Coreg 25 mg twice daily, Entresto twice daily, spironolactone 50 mg daily, and torsemide 20 mg daily.   -Blood pressure [name] low, so all this regimen has been stopped . -Pressure started to increase, but overall on the normal, so we will hold on resuming her medication today.  AKI  -Secondary to volume depletion from nausea and vomiting, and hypotension, this has responded to IV fluids .  Pain improved from 1.68> 1.3. -hold nephrotoxic medications . -Avoid hypotension  Diabetes mellitus type 2:  -  A1c this admission [name] 10.5, dose of insulin at home, NPH insulin 170 units daily, Byetta 10 mcg twice daily, and metformin XR 1000 mg twice daily, her CBGs appears to be improving on current dose of Lantus 55 units twice daily, and 8 units NovoLog before meals ;. -Continue with sliding scale, and 6 units of NovoLog before meals. -Hypoglycemic protocol -Hold Metformin  -Continue Byetta 10 mcg twice daily -CBGs before every meal with moderate SSI  Systemic Lupus: Patient [name] on Imuran 150 mg twice daily, belimumab 200 mg q. weekly on Tuesday, and prednisone. -Continue Imuran  Anxiety: Home medications include Wellbutrin 300 mg daily and Xanax 0.5 mg as needed. -Continue current home regimen  Hypothyroidism: Home  medication regimen includes levothyroxine 50 mcg  daily. -Continue levothyroxine  Hyperlipidemia -Continue pravastatin  GERD: Home medications include Pepcid 20 mg twice daily and Carafate 1 g twice daily. -Continue home regimen  Morbid obesity: BMI 56.15 kg/m -Will need continued education on need weight loss   COVID-19 Labs  Recent Labs    02/01/20 1337 02/02/20 0607 02/03/20 0206  DDIMER 1.20* 1.23* 1.17*  FERRITIN 518* 447* 453*  CRP 1.5* 1.0* 0.9    Lab Results  Component Value Date   SARSCOV2NAA POSITIVE (A) 01/29/2020   [place] NEGATIVE 10/23/2019   [place] NEGATIVE 08/24/2019   [place] NEGATIVE 08/12/2019     Code Status : Full  Family Communication  : D/W patient  Disposition Plan  :  Status [name]: Inpatient  Remains inpatient appropriate because:IV treatments appropriate due to intensity of illness or inability to take PO   Dispo: The patient [name] from: Home              Anticipated d/c [name] to: Home              Anticipated d/c date [name]: 2 days              Patient currently [name] not medically stable to d/c.        Barriers For Discharge :   Consults  :  none  Procedures  : None  DVT Prophylaxis  :  [place] lovenox  Lab Results  Component Value Date   PLT 262 02/03/2020    Antibiotics  :    Anti-infectives (From admission, onward)   Start     Dose/Rate Route Frequency Ordered Stop   01/31/20 1400  Ampicillin-Sulbactam (UNASYN) 3 g in sodium chloride 0.9 % 100 mL IVPB  Status:  Discontinued        3 g 200 mL/hr over 30 Minutes Intravenous Every 6 hours 01/31/20 1315 02/01/20 1316   01/31/20 0900  vancomycin (VANCOREADY) IVPB 1250 mg/250 mL     Discontinue     1,250 mg 166.7 mL/hr over 90 Minutes Intravenous Every 12 hours 01/30/20 2024     01/30/20 2100  vancomycin (VANCOCIN) 2,500 mg in sodium chloride 0.9 % 500 mL IVPB        2,500 mg 250 mL/hr over 120 Minutes Intravenous  Once 01/30/20 2024 01/31/20 0235   01/30/20 1000   remdesivir 100 mg in sodium chloride 0.9 % 100 mL IVPB       "Followed by" Linked Group Details   100 mg 200 mL/hr over 30 Minutes Intravenous Daily 01/29/20 1546 02/02/20 1209   01/29/20 2200  hydroxychloroquine (PLAQUENIL) tablet 200 mg     Discontinue     200 mg Oral 2 times daily 01/29/20 1546     01/29/20 2000  cefTRIAXone (ROCEPHIN) 2 g in sodium chloride 0.9 % 100 mL IVPB  Status:  Discontinued        2 g 200 mL/hr over 30 Minutes Intravenous Every 24 hours 01/29/20 1850 01/31/20 0732   01/29/20 2000  azithromycin (ZITHROMAX) 500 mg in sodium chloride 0.9 % 250 mL IVPB  Status:  Discontinued        500 mg 250 mL/hr over 60 Minutes Intravenous Every 24 hours 01/29/20 1850 01/31/20 0732   01/29/20 1545  remdesivir 200 mg in sodium chloride 0.9% 250 mL IVPB       "Followed by" Linked Group Details   200 mg 580 mL/hr over 30 Minutes Intravenous Once 01/29/20 1546 01/29/20 1856        Objective:  Vitals:   02/02/20 1214 02/02/20 2159 02/03/20 0528 02/03/20 1235  BP: (!) 109/57   136/79  Pulse: 74   80  Resp: 20   (!) 21  Temp: 98.9 F (37.2 C) 97.7 F (36.5 C) 98 F (36.7 C) 98.7 F (37.1 C)  TempSrc: Oral Oral Oral Oral  SpO2: 91%   96%  Weight:      Height:        Wt Readings from Last 3 Encounters:  02/02/20 (!) 169.4 kg  01/21/20 (!) 155.1 kg  10/23/19 (!) 160 kg     Intake/Output Summary (Last 24 hours) at 02/03/2020 1349 Last data filed at 02/02/2020 2100 Gross per 24 hour  Intake 462 ml  Output 250 ml  Net 212 ml     Physical Exam  Awake Alert, Oriented X 3, No new F.N deficits, Normal affect Symmetrical Chest wall movement, Good air movement bilaterally, CTAB RRR,No Gallops,Rubs or new Murmurs, No Parasternal Heave +ve B.Sounds, Abd Soft, No tenderness, No rebound - guarding or rigidity. No Cyanosis, Clubbing or edema, No new Rash or bruise      Data Review:    CBC Recent Labs  Lab 01/30/20 0013 01/31/20 0325 02/01/20 1337 02/02/20 0607  02/03/20 0206  WBC 4.8 2.8* 8.0 6.1 5.3  HGB 11.2* 10.7* 11.4* 11.2* 10.7*  HCT 35.0* 33.6* 37.4 36.6 35.1*  PLT 198 201 225 224 262  MCV 87.5 88.4 90.1 90.1 90.7  MCH 28.0 28.2 27.5 27.6 27.6  MCHC 32.0 31.8 30.5 30.6 30.5  RDW 13.2 13.2 13.5 13.4 13.2  LYMPHSABS 0.7 0.9 0.7 1.6 1.2  MONOABS 0.1 0.3 0.5 0.7 0.5  EOSABS 0.0 0.0 0.0 0.0 0.0  BASOSABS 0.0 0.0 0.0 0.0 0.0    Chemistries  Recent Labs  Lab 01/30/20 0013 01/31/20 0325 02/01/20 1337 02/02/20 0607 02/03/20 0206  NA 135 136 137 141 137  K 4.3 4.7 4.4 4.0 4.1  CL 107 108 109 110 109  CO2 18* 19* 19* 22 20*  GLUCOSE 258* 374* 415* 235* 262*  BUN 15 25* 18 16 17   CREATININE 1.26* 1.68* 1.30* 1.06* 1.08*  CALCIUM 8.3* 8.3* 8.6* 8.8* 8.5*  MG 1.8 1.9 1.9 1.8 1.8  AST 60* 27 22 16  13*  ALT 16 15 13 12 10   ALKPHOS 70 70 79 81 75  BILITOT 0.5 0.5 0.6 0.5 0.6   ------------------------------------------------------------------------------------------------------------------ No results for input(s): CHOL, HDL, LDLCALC, TRIG, CHOLHDL, LDLDIRECT in the last 72 hours.  Lab Results  Component Value Date   HGBA1C 10.5 (H) 01/30/2020   ------------------------------------------------------------------------------------------------------------------ No results for input(s): TSH, T4TOTAL, T3FREE, THYROIDAB in the last 72 hours.  Invalid input(s): FREET3 ------------------------------------------------------------------------------------------------------------------ Recent Labs    02/02/20 0607 02/03/20 0206  FERRITIN 447* 453*    Coagulation profile No results for input(s): INR, PROTIME in the last 168 hours.  Recent Labs    02/02/20 0607 02/03/20 0206  DDIMER 1.23* 1.17*    Cardiac Enzymes No results for input(s): CKMB, TROPONINI, MYOGLOBIN in the last 168 hours.  Invalid input(s): CK ------------------------------------------------------------------------------------------------------------------     Component Value Date/Time   BNP 35.8 01/29/2020 1915    Inpatient Medications  Scheduled Meds: . albuterol  2 puff Inhalation Q6H  . vitamin C  500 mg Oral Daily  . aspirin EC  81 mg Oral Daily  . azaTHIOprine  150 mg Oral BID  . buPROPion  300 mg Oral Daily  . carvedilol  25 mg Oral BID WC  . cetaphil  1 application Topical BID  . cycloSPORINE  1 drop Both Eyes BID  . dexamethasone  6 mg Oral Daily  . enoxaparin (LOVENOX) injection  75 mg Subcutaneous Q24H  . famotidine  20 mg Oral BID  . feeding supplement (GLUCERNA SHAKE)  237 mL Oral TID BM  . hydroxychloroquine  200 mg Oral BID  . insulin aspart  0-15 Units Subcutaneous TID WC  . insulin aspart  0-5 Units Subcutaneous QHS  . insulin aspart  8 Units Subcutaneous TID WC  . insulin glargine  55 Units Subcutaneous BID  . levothyroxine  50 mcg Oral Q0600  . loratadine  10 mg Oral Daily  . mometasone-formoterol  2 puff Inhalation BID  . pantoprazole  40 mg Oral Daily  . pravastatin  10 mg Oral Daily  . pregabalin  75 mg Oral TID  . sodium chloride flush  3 mL Intravenous Q12H  . sucralfate  1 g Oral BID  . topiramate  75 mg Oral QHS  . zinc sulfate  220 mg Oral Daily   Continuous Infusions: . sodium chloride Stopped (01/29/20 2205)  . vancomycin 1,250 mg (02/03/20 0838)   PRN Meds:.sodium chloride, acetaminophen, ALPRAZolam, chlorpheniramine-HYDROcodone, diclofenac Sodium, doxepin, fluticasone, guaiFENesin-dextromethorphan, ondansetron **OR** ondansetron (ZOFRAN) IV, oxyCODONE, promethazine, traMADol  Micro Results Recent Results (from the past 240 hour(s))  SARS Coronavirus 2 by RT PCR (hospital order, performed in [place] [facility] hospital lab) Nasopharyngeal Nasopharyngeal Swab     Status: Abnormal   Collection Time: 01/29/20  9:56 AM   Specimen: Nasopharyngeal Swab  Result Value Ref Range Status   SARS Coronavirus 2 POSITIVE (A) NEGATIVE Final    Comment: RESULT CALLED TO, READ BACK BY AND VERIFIED WITH: RN LOUIE  CHILTON 9390 01/29/20 KB (NOTE) SARS-CoV-2 target nucleic acids are DETECTED  SARS-CoV-2 RNA [name] generally detectable in upper respiratory specimens  during the acute phase of infection.  Positive results are indicative  of the presence of the identified virus, but do not rule out bacterial infection or co-infection with other pathogens not detected by the test.  Clinical correlation with patient history and  other diagnostic information [name] necessary to determine patient infection status.  The expected result [name] negative.  Fact Sheet for Patients:   StrictlyIdeas.no   Fact Sheet for Healthcare Providers:   BankingDealers.co.za    This test [name] not yet approved or cleared by the [place] FDA and  has been authorized for detection and/or diagnosis of SARS-CoV-2 by FDA under an Emergency Use Authorization (EUA).  This EUA will remain in effect (meaning this tes t can be used) for the duration of  the COVID-19 declaration under Section 564(b)(1) of the Act, 21 U.S.C. section 360-bbb-3(b)(1), unless the authorization [name] terminated or revoked sooner.  Performed at [facility], [place] [address], [place], [place] [address]   Culture, Urine     Status: Abnormal   Collection Time: 01/29/20  2:06 PM   Specimen: Urine, Random  Result Value Ref Range Status   Specimen Description URINE, RANDOM  Final   Special Requests NONE  Final   Culture (A)  Final    >=100,000 COLONIES/mL DIPHTHEROIDS(CORYNEBACTERIUM SPECIES) Standardized susceptibility testing for this organism [name] not available. Performed at [facility], [place] [address] [place], [place] [address]    Report Status 01/30/2020 FINAL  Final  Culture, blood (Routine X 2) w Reflex to ID Panel     Status: Abnormal   Collection Time: 01/29/20  6:57 PM  Specimen: BLOOD  Result Value Ref Range Status   Specimen Description BLOOD RIGHT ANTECUBITAL  Final   Special Requests    Final    BOTTLES DRAWN AEROBIC AND ANAEROBIC Blood Culture adequate volume   Culture  Setup Time   Final    IN BOTH AEROBIC AND ANAEROBIC BOTTLES GRAM POSITIVE RODS CRITICAL RESULT CALLED TO, READ BACK BY AND VERIFIED WITH: Salome Holmes [facility] 01/30/20 1938 JDW    Culture (A)  Final    CORYNEBACTERIUM STRIATUM ACTINOMYCES NEUII Standardized susceptibility testing for this organism [name] not available. Performed at [facility], [place] [address] [place], [place] [address]    Report Status 02/02/2020 FINAL  Final  Culture, blood (Routine X 2) w Reflex to ID Panel     Status: Abnormal   Collection Time: 01/29/20  7:05 PM   Specimen: BLOOD LEFT HAND  Result Value Ref Range Status   Specimen Description BLOOD LEFT HAND  Final   Special Requests   Final    BOTTLES DRAWN AEROBIC AND ANAEROBIC Blood Culture adequate volume   Culture  Setup Time   Final    ANAEROBIC BOTTLE ONLY GRAM POSITIVE RODS CRITICAL VALUE NOTED.  VALUE [name] CONSISTENT WITH PREVIOUSLY REPORTED AND CALLED VALUE.    Culture (A)  Final    CORYNEBACTERIUM STRIATUM Standardized susceptibility testing for this organism [name] not available. Performed at [facility], [place] [address], [place], [place] [address]    Report Status 02/01/2020 FINAL  Final  Culture, blood (Routine X 2) w Reflex to ID Panel     Status: None (Preliminary result)   Collection Time: 02/02/20  6:03 AM   Specimen: BLOOD  Result Value Ref Range Status   Specimen Description BLOOD LEFT ANTECUBITAL  Final   Special Requests   Final    BOTTLES DRAWN AEROBIC AND ANAEROBIC Blood Culture adequate volume   Culture   Final    NO GROWTH 1 DAY Performed at [facility], [place] [address], [place], [place] [address]    Report Status PENDING  Incomplete  Culture, blood (Routine X 2) w Reflex to ID Panel     Status: None (Preliminary result)   Collection Time: 02/02/20  6:03 AM   Specimen: BLOOD  Result Value Ref Range Status   Specimen Description BLOOD  RIGHT ANTECUBITAL  Final   Special Requests   Final    BOTTLES DRAWN AEROBIC AND ANAEROBIC Blood Culture adequate volume   Culture   Final    NO GROWTH 1 DAY Performed at [facility], [place] [address], [place], [place] [address]    Report Status PENDING  Incomplete    Radiology Reports CT ABDOMEN PELVIS W CONTRAST  Result Date: 01/29/2020 CLINICAL DATA:  Fever, body aches, and sore throat for the past 2 days. Lower abdominal pain. COVID-19 positive. EXAM: CT ABDOMEN AND PELVIS WITH CONTRAST TECHNIQUE: Multidetector CT imaging of the abdomen and pelvis was performed using the standard protocol following bolus administration of intravenous contrast. CONTRAST:  112mL OMNIPAQUE IOHEXOL 300 MG/ML  SOLN COMPARISON:  CT abdomen pelvis dated October 23, 2019. FINDINGS: Lower chest: Small subpleural ground-glass densities at the lung bases with more focal consolidation versus atelectasis in the left lower lobe. Hepatobiliary: Heterogeneous hepatic parenchyma and ill-defined hypodense liver lesions are less conspicuous when compared to the prior study due to different phase of contrast. No obvious significant interval change. No new focal liver abnormality. The gallbladder [name] unremarkable. No biliary dilatation. Pancreas:  Unremarkable. No pancreatic ductal dilatation or surrounding inflammatory changes. Spleen: Normal in size without focal abnormality. Adrenals/Urinary Tract: Adrenal glands are unremarkable. Unchanged punctate calculus in the upper pole of the right kidney. No hydronephrosis. The bladder [name] unremarkable. Stomach/Bowel: Stomach [name] within normal limits. Appendix appears normal. No evidence of bowel wall thickening, distention, or inflammatory changes. Vascular/Lymphatic: No significant vascular findings are present. No enlarged abdominal or pelvic lymph nodes. Reproductive: Uterus and bilateral adnexa are unremarkable. Other: No abdominal wall hernia or abnormality. No abdominopelvic ascites. No  pneumoperitoneum. Musculoskeletal: No acute or significant osseous findings. IMPRESSION: 1. No acute intra-abdominal process. 2. Small subpleural ground-glass densities at the lung bases with more focal consolidation versus atelectasis in the left lower lobe. Findings are suspicious for COVID-19 pneumonia given clinical history. 3. Heterogeneous hepatic parenchyma and ill-defined hypodense liver lesions are less conspicuous when compared to the prior study due to different phase of contrast, but there [name] no obvious significant interval change. 4. Unchanged nonobstructive right nephrolithiasis. Electronically Signed   By: Titus Dubin M.D.   On: 01/29/2020 14:06   DG CHEST PORT 1 VIEW  Result Date: 02/01/2020 CLINICAL DATA:  COVID-19 positive patient.  Shortness of breath. EXAM: PORTABLE CHEST 1 VIEW COMPARISON:  Single-view of the chest 01/29/2020 and 10/23/2019. FINDINGS: There [name] new patchy bilateral airspace disease best seen in the upper lung zones and periphery of the right lung base. Cardiomegaly and vascular congestion again noted. IMPRESSION: New patchy bilateral airspace disease most consistent with pneumonia. Chronic cardiomegaly and vascular congestion. Electronically Signed   By: Inge Rise M.D.   On: 02/01/2020 13:50   DG Chest Portable 1 View  Result Date: 01/29/2020 CLINICAL DATA:  Dyspnea, fever, nausea and vomiting. EXAM: PORTABLE CHEST 1 VIEW COMPARISON:  Single-view of the chest 06/24/2020 and 08/13/2019. CT chest 10/05/2019. FINDINGS: There [name] cardiomegaly and pulmonary vascular congestion. No consolidative process, pneumothorax or effusion [name] identified. No acute or focal bony abnormality. IMPRESSION: Cardiomegaly and pulmonary vascular congestion. Electronically Signed   By: Inge Rise M.D.   On: 01/29/2020 09:13   ECHOCARDIOGRAM LIMITED  Result Date: 02/02/2020    ECHOCARDIOGRAM LIMITED REPORT   Patient Name:   [name] Date of Exam: 02/02/2020 Medical Rec #:   [id number]     Height:       68.0 in Accession #:    [id number]    Weight:       373.5 lb Date of Birth:  [birth year]     BSA:          2.665 m Patient Age:    37 years      BP:           142/77 mmHg Patient Gender: F             HR:           74 bpm. Exam Location:  [facility] Procedure: Limited Echo, Cardiac Doppler and Color Doppler Indications:    Bacteremia 790.7/R78.81  History:        Patient has prior history of Echocardiogram examinations, most                 recent 05/11/2018. Risk Factors:Morbid Obesity. CV19 positive.  Sonographer:    [name] RDCS Referring Phys: [place]  Sonographer Comments: This was a limited echo to rule out endocarditis in a patient positive for CV19. IMPRESSIONS  1. Left ventricular ejection fraction, by estimation, [name] 40 to 45%. The left ventricle  has mildly decreased function. The left ventricle demonstrates global hypokinesis. Left ventricular diastolic function could not be evaluated.  2. Right ventricular systolic function [name] normal. The right ventricular size [name] mildly enlarged. Tricuspid regurgitation signal [name] inadequate for assessing PA pressure.  3. The mitral valve [name] normal in structure. Trivial mitral valve regurgitation.  4. The aortic valve [name] normal in structure. Aortic valve regurgitation [name] not visualized. No aortic stenosis [name] present. Conclusion(s)/Recommendation(s): No evidence of valvular vegetations on this transthoracic echocardiogram. Would recommend a transesophageal echocardiogram to exclude infective endocarditis if clinically indicated. FINDINGS  Left Ventricle: Left ventricular ejection fraction, by estimation, [name] 40 to 45%. The left ventricle has mildly decreased function. The left ventricle demonstrates global hypokinesis. The left ventricular internal cavity size was normal in size. There [name]  no left ventricular hypertrophy. Right Ventricle: The right ventricular size [name] mildly enlarged. No increase in right ventricular wall  thickness. Right ventricular systolic function [name] normal. Tricuspid regurgitation signal [name] inadequate for assessing PA pressure. Left Atrium: Left atrial size was normal in size. Right Atrium: Right atrial size was normal in size. Pericardium: There [name] no evidence of pericardial effusion. Mitral Valve: The mitral valve [name] normal in structure. There [name] mild thickening of the mitral valve leaflet(s). Trivial mitral valve regurgitation. Tricuspid Valve: The tricuspid valve [name] normal in structure. Tricuspid valve regurgitation [name] trivial. There [name] no evidence of tricuspid valve vegetation. Aortic Valve: The aortic valve [name] normal in structure. Aortic valve regurgitation [name] not visualized. No aortic stenosis [name] present. Pulmonic Valve: The pulmonic valve was not well visualized. Pulmonic valve regurgitation [name] trivial. Aorta: The aortic root [name] normal in size and structure. Venous: The inferior vena cava was not well visualized. IAS/Shunts: The interatrial septum was not well visualized.  LEFT VENTRICLE PLAX 2D LVIDd:         6.00 cm LVIDs:         4.50 cm LV PW:         1.00 cm LV IVS:        0.90 cm LVOT diam:     2.10 cm LVOT Area:     3.46 cm  LEFT ATRIUM         Index LA diam:    3.50 cm 1.31 cm/m   AORTA Ao Root diam: 3.10 cm Ao Asc diam:  3.50 cm  SHUNTS Systemic Diam: 2.10 cm Adrian Prows MD Electronically signed by Adrian Prows MD Signature Date/Time: 02/02/2020/1:01:46 PM    Final      Phillips Climes M.D on 02/03/2020 at 1:49 PM    [facility] -  Office  [phone]

## 2020-02-03 NOTE — Progress Notes (Signed)
Physical Therapy Treatment Patient Details Name: [name] MRN: [id number] DOB: 06/25/1971 Today's Date: 02/03/2020    History of Present Illness [name] is a 49 y.o. female with medical history significant of systolic CHF, lupus, diabetes mellitus type 2 with gastroparesis, and morbid obesity presents with complaints of shortness of breath and nausea over the last 5 days. Upon admission into the hospital patient was noted to be febrile up to 103 F, heart rates elevated up to 118, respiration 15-24, blood pressures 137/91-188/88, and O2 saturations currently maintained on on room air.  With ambulation patient's O2 saturations dropped to 89%; Covid screening was positive    PT Comments    Patient required frequent seated rest breaks between multiple short distance walks in her room (longest 18 ft). On 4L with lowest O2 sat 87%, with then slow recovery to 90% (and ultimately to 94%). She reported generalized fatigue as the factor limiting her to short distances--not shortness of breath.     Follow Up Recommendations  Home health PT     Equipment Recommendations  Rolling walker with 5" wheels (Bariatric)    Recommendations for Other Services       Precautions / Restrictions Precautions Precautions: Fall Precaution Comments: monitor O2 sats    Mobility  Bed Mobility Overal bed mobility: Modified Independent Bed Mobility: Supine to Sit     Supine to sit: HOB elevated;Modified independent (Device/Increase time) Sit to supine: HOB elevated;Modified independent (Device/Increase time)   General bed mobility comments: pt eager to get to bathroom with no time to lower HOB   Transfers Overall transfer level: Needs assistance Equipment used: Straight cane Transfers: Sit to/from Stand Sit to Stand: Supervision         General transfer comment: increased time/effort, completed multiple sit<>stands throughout session - improved with repetition    Ambulation/Gait Ambulation/Gait assistance: Supervision Gait Distance (Feet): 15 Feet (10, 18, 15 with seated rests between walks) Assistive device: Straight cane Gait Pattern/deviations: Step-through pattern;Decreased step length - right;Decreased step length - left     General Gait Details: Slow, guarded steps; one slight stagger step posteriorly with recovery without assist   Stairs             Wheelchair Mobility    Modified Rankin (Stroke Patients Only)       Balance Overall balance assessment: Needs assistance Sitting-balance support: Feet supported Sitting balance-Leahy Scale: Good     Standing balance support: Single extremity supported;During functional activity Standing balance-Leahy Scale: Fair Standing balance comment: typically dependent on at least single UE support                             Cognition Arousal/Alertness: Awake/alert Behavior During Therapy: Flat affect Overall Cognitive Status: Within Functional Limits for tasks assessed                                        Exercises Other Exercises Other Exercises: Reviewed importance of IS and flutter valve. Educated to complete ~3 breaths each device every 15 minutes or so (vs trying to do all 10 reps at one time every hour)    General Comments General comments (skin integrity, edema, etc.): Patient reporting extreme fatigue. Also with incr coughing with mobility which further impacted the amount of time it took her to complete short distance ambulation.  Pertinent Vitals/Pain Pain Assessment: Faces Faces Pain Scale: Hurts a little bit Pain Location: generalized Pain Descriptors / Indicators: Aching Pain Intervention(s): Limited activity within patient's tolerance    Home Living                      Prior Function            PT Goals (current goals can now be found in the care plan section) Acute Rehab PT Goals Patient Stated Goal:  regain strength/independence  Time For Goal Achievement: 02/16/20 Potential to Achieve Goals: Good Progress towards PT goals: Progressing toward goals    Frequency    Min 3X/week      PT Plan Current plan remains appropriate    Co-evaluation              AM-PAC PT "6 Clicks" Mobility   Outcome Measure  Help needed turning from your back to your side while in a flat bed without using bedrails?: None Help needed moving from lying on your back to sitting on the side of a flat bed without using bedrails?: A Little Help needed moving to and from a bed to a chair (including a wheelchair)?: None Help needed standing up from a chair using your arms (e.g., wheelchair or bedside chair)?: None Help needed to walk in hospital room?: None Help needed climbing 3-5 steps with a railing? : A Lot 6 Click Score: 21    End of Session Equipment Utilized During Treatment: Oxygen Activity Tolerance: Other (comment) (desats with activity) Patient left: in bed;with call bell/phone within reach Nurse Communication: Mobility status PT Visit Diagnosis: Unsteadiness on feet (R26.81);Other abnormalities of gait and mobility (R26.89) (Decr functional capacity)     Time: 7371-0626 PT Time Calculation (min) (ACUTE ONLY): 55 min  Charges:  $Gait Training: 38-52 mins                      Arby Barrette, PT Pager [phone]    Rexanne Mano 02/03/2020, 5:20 PM

## 2020-02-04 DIAGNOSIS — R7881 Bacteremia: Secondary | ICD-10-CM

## 2020-02-04 LAB — CBC
HCT: 34.1 % — ABNORMAL LOW (ref 36.0–46.0)
Hemoglobin: 10.7 g/dL — ABNORMAL LOW (ref 12.0–15.0)
MCH: 28.6 pg (ref 26.0–34.0)
MCHC: 31.4 g/dL (ref 30.0–36.0)
MCV: 91.2 fL (ref 80.0–100.0)
Platelets: 243 10*3/uL (ref 150–400)
RBC: 3.74 MIL/uL — ABNORMAL LOW (ref 3.87–5.11)
RDW: 13 % (ref 11.5–15.5)
WBC: 6.6 10*3/uL (ref 4.0–10.5)
nRBC: 0 % (ref 0.0–0.2)

## 2020-02-04 LAB — GLUCOSE, CAPILLARY
Glucose-Capillary: 135 mg/dL — ABNORMAL HIGH (ref 70–99)
Glucose-Capillary: 189 mg/dL — ABNORMAL HIGH (ref 70–99)
Glucose-Capillary: 307 mg/dL — ABNORMAL HIGH (ref 70–99)
Glucose-Capillary: 288 mg/dL — ABNORMAL HIGH (ref 70–99)

## 2020-02-04 LAB — BASIC METABOLIC PANEL
Anion gap: 9 (ref 5–15)
BUN: 17 mg/dL (ref 6–20)
CO2: 21 mmol/L — ABNORMAL LOW (ref 22–32)
Calcium: 8.5 mg/dL — ABNORMAL LOW (ref 8.9–10.3)
Chloride: 107 mmol/L (ref 98–111)
Creatinine, Ser: 0.93 mg/dL (ref 0.44–1.00)
GFR calc Af Amer: >60 mL/min (ref >60)
GFR calc non Af Amer: >60 mL/min (ref >60)
Glucose, Bld: 185 mg/dL — ABNORMAL HIGH (ref 70–99)
Potassium: 3.9 mmol/L (ref 3.5–5.1)
Sodium: 137 mmol/L (ref 135–145)

## 2020-02-04 LAB — VANCOMYCIN, TROUGH: Vancomycin Tr: 13 ug/mL — ABNORMAL LOW (ref 15–20)

## 2020-02-04 MED ORDER — LOPERAMIDE HCL 2 MG PO CAPS
2.0000 mg | ORAL_CAPSULE | ORAL | Status: DC | PRN
  Administered 2020-02-04 (×2): 2 mg via ORAL
  Filled 2020-02-04 (×2): qty 1

## 2020-02-04 MED ORDER — ENOXAPARIN SODIUM 80 MG/0.8ML ~~LOC~~ SOLN
80.0000 mg | SUBCUTANEOUS | Status: DC
  Administered 2020-02-04 – 2020-02-06 (×3): 80 mg via SUBCUTANEOUS
  Filled 2020-02-04 (×3): qty 0.8

## 2020-02-04 MED ORDER — SACUBITRIL-VALSARTAN 97-103 MG PO TABS
1.0000 | ORAL_TABLET | Freq: Two times a day (BID) | ORAL | Status: DC
  Administered 2020-02-04 – 2020-02-07 (×6): 1 via ORAL
  Filled 2020-02-04 (×6): qty 1

## 2020-02-04 NOTE — Progress Notes (Signed)
Occupational Therapy Treatment Patient Details Name: [name] MRN: [id number] DOB: 1970/09/03 Today's Date: 02/04/2020    History of present illness [name] is a 49 y.o. female with medical history significant of systolic CHF, lupus, diabetes mellitus type 2 with gastroparesis, and morbid obesity presents with complaints of shortness of breath and nausea over the last 5 days. Upon admission into the hospital patient was noted to be febrile up to 103 F, heart rates elevated up to 118, respiration 15-24, blood pressures 137/91-188/88, and O2 saturations currently maintained on on room air.  With ambulation patient's O2 saturations dropped to 89%; Covid screening was positive   OT comments  Patient in chair on arrival and recovering from having walked to/from bathroom for toileting.  Patient feeling very tired and weak, but agreeable to try grooming at sink.  Walked to sink with SPC and one hand held assist.  Patient would likely benefit from use of RW and suggested to patient we try that next time for increased safety and energy conservation.  Patient sat at sink for grooming as activity tolerance too decreased to remain standing.  Educated patient on energy conservation strategies as well as answered her questions about COVID and its effects.  Patient initially walked to sink on RA and SpO2 decreased from 97 to 88.  Once on 2L patient able to maintain 92 with mobility, but still short of breath.  3/4 on dypsnea scale.  Will continue to follow acutely with OT to increase strength, balance, and activity tolerance for improved safety and independence with ADLs.  Follow Up Recommendations  Home health OT;Supervision/Assistance - 24 hour    Equipment Recommendations  Tub/shower seat    Recommendations for Other Services      Precautions / Restrictions Precautions Precautions: Fall Precaution Comments: monitor O2 sats Restrictions Weight Bearing Restrictions: No       Mobility Bed  Mobility               General bed mobility comments: OOB in chair  Transfers Overall transfer level: Needs assistance Equipment used: Straight cane;1 person hand held assist Transfers: Sit to/from Stand Sit to Stand: Supervision              Balance Overall balance assessment: Needs assistance Sitting-balance support: Feet supported Sitting balance-Leahy Scale: Good     Standing balance support: Single extremity supported;During functional activity Standing balance-Leahy Scale: Fair Standing balance comment: Benefited from one hand held assist along with use of cane.  Would likely do better with RW                           ADL either performed or assessed with clinical judgement   ADL Overall ADL's : Needs assistance/impaired     Grooming: Wash/dry face;Oral care;Min guard;Sitting;Standing Grooming Details (indicate cue type and reason): Sitting at sink for energy conservation.  Stood to get water/spit.                             Functional mobility during ADLs: Minimal assistance;Cane General ADL Comments: decreased activity tolerance limiting     Vision       Perception     Praxis      Cognition Arousal/Alertness: Awake/alert Behavior During Therapy: Flat affect Overall Cognitive Status: Within Functional Limits for tasks assessed  Exercises     Shoulder Instructions       General Comments      Pertinent Vitals/ Pain       Pain Assessment: Faces Faces Pain Scale: Hurts a little bit Pain Location: generalized Pain Descriptors / Indicators: Aching;Discomfort Pain Intervention(s): Limited activity within patient's tolerance;Monitored during session;Repositioned  Home Living                                          Prior Functioning/Environment              Frequency  Min 2X/week        Progress Toward Goals  OT Goals(current  goals can now be found in the care plan section)  Progress towards OT goals: Progressing toward goals  Acute Rehab OT Goals Patient Stated Goal: regain strength/independence  OT Goal Formulation: With patient Time For Goal Achievement: 02/16/20 Potential to Achieve Goals: Good  Plan Discharge plan remains appropriate    Co-evaluation                 AM-PAC OT "6 Clicks" Daily Activity     Outcome Measure   Help from another person eating meals?: None Help from another person taking care of personal grooming?: A Little Help from another person toileting, which includes using toliet, bedpan, or urinal?: A Little Help from another person bathing (including washing, rinsing, drying)?: A Little Help from another person to put on and taking off regular upper body clothing?: A Little Help from another person to put on and taking off regular lower body clothing?: A Little 6 Click Score: 19    End of Session Equipment Utilized During Treatment: Oxygen;Other (comment) (SPC)  OT Visit Diagnosis: Other abnormalities of gait and mobility (R26.89);Other (comment);Muscle weakness (generalized) (M62.81) (decreased activity tolerance)   Activity Tolerance Patient limited by fatigue;Patient tolerated treatment well   Patient Left in chair;with call bell/phone within reach   Nurse Communication Mobility status        Time: 8916-9450 OT Time Calculation (min): 27 min  Charges: OT General Charges $OT Visit: 1 Visit OT Treatments $Self Care/Home Management : 8-22 mins $Therapeutic Activity: 8-22 mins  August Luz, OTR/L    Phylliss Bob 02/04/2020, 3:19 PM

## 2020-02-04 NOTE — Progress Notes (Signed)
PROGRESS NOTE                                                                                                                                                                                                             Patient Demographics:    [name], is a 49 y.o. female, DOB - [birth year], KDX:[id number]  Admit date - 01/28/2020   Admitting Physician Norval Morton, MD  Outpatient Primary MD for the patient is Bartholome Bill, MD  LOS - 6   Chief Complaint  Patient presents with  . COVID like symptoms       Brief Narrative   HPI: [name] is a 49 y.o. female with medical history significant of systolic CHF, lupus, diabetes mellitus type 2 with gastroparesis, and morbid obesity presents with complaints of shortness of breath and nausea over the last 5 days.  Reports having associated symptoms of subjective fever, chills, cough, congestion, chest tightness, headache, generalized body aches, joint pain, epigastric abdominal pain, and vomiting.  Emesis has been nonbloody in appearance.  Denies having any diarrhea, dysuria, or urinary frequency.  Since symptoms started she has been unable to take any of her medications due to her nausea and vomiting she is followed by Dr. Einar Gip of cardiology in the outpatient setting.  Last EF noted to be around 45 - 50% by echocardiogram from 05/2018.  Patient reports that she did not get her Covid vaccine as her doctors want sure if she should take it.  -She presents with fever of 103, shortness of breath, she is COVID-19 positive, as well work-up significant for bacteremia, please see discussion below.     Subjective:    [name] today still reports some occasional nausea, but no vomiting, reports cough, it is improving, still reports some musculoskeletal chest pain related to cough.  She still reports some diarrhea   Assessment  & Plan :    Principal Problem:   Pneumonia due to COVID-19  virus Active Problems:   SLE   HTN (hypertension)   Chronic systolic CHF (congestive heart failure) (HCC)   Morbid obesity ([place])   Diabetes ([place])   Sepsis /acute hypoxic respiratory failure due to secondary pneumonia due to COVID-19: -  Acute.  Patient presented febrile up to 103 F with tachycardia and tachypnea.   -Patient is morning on  1 to 2 L at rest, she did require up to 3 L oxygen with activity yesterday. -Remains with significant cough, dyspnea with minimal exertion, he was treated with IV remdesivir, last dose was 7/4, continue with steroids.-Empiric antibiotics of Rocephin and azithromycin due to immunocompromises status , procalcitonin within normal limit, does not appear to be bacterial pneumonia, have stopped her antibiotics . -Vitamin C and zinc -Tylenol as needed for fever -CRP has normalized which is reassuring COVID-19 Labs  Recent Labs    02/02/20 0607 02/03/20 0206  DDIMER 1.23* 1.17*  FERRITIN 447* 453*  CRP 1.0* 0.9    Lab Results  Component Value Date   SARSCOV2NAA POSITIVE (A) 01/29/2020   [place] NEGATIVE 10/23/2019   [place] NEGATIVE 08/24/2019   [place] NEGATIVE 08/12/2019     Corynebacterium UTI/bacteremia -Patient is immunocompromise, I have discussed with ID Dr. Drucilla Schmidt, even though Corynebacterium is uncommon pathogen for UTI, but given her bacteremia need to be treated, recommendation is 14 days of antibiotics, can continue with IV vancomycin during hospital stay, this can be transitioned to Zyvox upon discharge with goal of total of 14 days treatment. -So far surveillance blood cultures with no growth to date -D echo was obtained, discussed finding with Dr. Einar Gip, no evidence of vegetation on her 2D echo..  Chronic systolic CHF: Chronic.   - Patient does not appear to be grossly fluid overloaded at this time.  Last EF noted to be 45 to 50% back in 2019.  Initially with her nausea and vomiting, I will hold on any  diuresis. -Strict intake and output -Repeat 2D echo showing EF 45%, which is around her baseline, now blood pressure and kidney function has improved we will resume back on Coreg, and will add her medications gradually  Hypertension -Home medications include amlodipine 5 mg daily, BiDil 2 tablets 3 times daily, Coreg 25 mg twice daily, Entresto twice daily, spironolactone 50 mg daily, and torsemide 20 mg daily.   -Her blood pressure has been low, as well she did develop AKI, for which all of this regimen has been stopped, now blood pressure is improving and AKI has resolved, so she is being resumed gradually on her meds, now she is back on Coreg, I will go ahead and resume her back on Entresto today , resume rest of her medications gradually .  AKI -Secondary to volume depletion from nausea and vomiting, and hypotension, this has responded to IV fluids .  Pain improved from 1.68> 1.3. -hold nephrotoxic medications . -Avoid hypotension  Diabetes mellitus type 2:  -  A1c this admission is 10.5, dose of insulin at home, NPH insulin 170 units daily, Byetta 10 mcg twice daily, and metformin XR 1000 mg twice daily, her CBGs appears to be improving on current dose of Lantus 55 units twice daily, and 8 units NovoLog before meals ;. -Continue with sliding scale, and 6 units of NovoLog before meals. -Hypoglycemic protocol -Hold Metformin  -Continue Byetta 10 mcg twice daily -CBGs before every meal with moderate SSI  Systemic Lupus: Patient is on Imuran 150 mg twice daily, belimumab 200 mg q. weekly on Tuesday, and prednisone. -Continue Imuran  Anxiety: Home medications include Wellbutrin 300 mg daily and Xanax 0.5 mg as needed. -Continue current home regimen  Hypothyroidism: Home medication regimen includes levothyroxine 50 mcg daily. -Continue levothyroxine  Hyperlipidemia -Continue pravastatin  GERD: Home medications include Pepcid 20 mg twice daily and Carafate 1 g twice  daily. -Continue home regimen  Morbid obesity: BMI 56.15  kg/m -Will need continued education on need weight loss   COVID-19 Labs  Recent Labs    02/02/20 0607 02/03/20 0206  DDIMER 1.23* 1.17*  FERRITIN 447* 453*  CRP 1.0* 0.9    Lab Results  Component Value Date   SARSCOV2NAA POSITIVE (A) 01/29/2020   [place] NEGATIVE 10/23/2019   [place] NEGATIVE 08/24/2019   [place] NEGATIVE 08/12/2019     Code Status : Full  Family Communication  : D/W patient  Disposition Plan  :  Status is: Inpatient  Remains inpatient appropriate because:IV treatments appropriate due to intensity of illness or inability to take PO and Inpatient level of care appropriate due to severity of illness   Dispo: The patient is from: Home              Anticipated d/c is to: Home              Anticipated d/c date is: 2 days              Patient currently is not medically stable to d/c.  She remains significantly deconditioned, frequently short of breath with activity.        Barriers For Discharge :   Consults  :  none  Procedures  : None  DVT Prophylaxis  :  [place] lovenox  Lab Results  Component Value Date   PLT 243 02/04/2020    Antibiotics  :    Anti-infectives (From admission, onward)   Start     Dose/Rate Route Frequency Ordered Stop   01/31/20 1400  Ampicillin-Sulbactam (UNASYN) 3 g in sodium chloride 0.9 % 100 mL IVPB  Status:  Discontinued        3 g 200 mL/hr over 30 Minutes Intravenous Every 6 hours 01/31/20 1315 02/01/20 1316   01/31/20 0900  vancomycin (VANCOREADY) IVPB 1250 mg/250 mL     Discontinue     1,250 mg 166.7 mL/hr over 90 Minutes Intravenous Every 12 hours 01/30/20 2024     01/30/20 2100  vancomycin (VANCOCIN) 2,500 mg in sodium chloride 0.9 % 500 mL IVPB        2,500 mg 250 mL/hr over 120 Minutes Intravenous  Once 01/30/20 2024 01/31/20 0235   01/30/20 1000  remdesivir 100 mg in sodium chloride 0.9 % 100 mL IVPB       "Followed by" Linked  Group Details   100 mg 200 mL/hr over 30 Minutes Intravenous Daily 01/29/20 1546 02/02/20 1209   01/29/20 2200  hydroxychloroquine (PLAQUENIL) tablet 200 mg     Discontinue     200 mg Oral 2 times daily 01/29/20 1546     01/29/20 2000  cefTRIAXone (ROCEPHIN) 2 g in sodium chloride 0.9 % 100 mL IVPB  Status:  Discontinued        2 g 200 mL/hr over 30 Minutes Intravenous Every 24 hours 01/29/20 1850 01/31/20 0732   01/29/20 2000  azithromycin (ZITHROMAX) 500 mg in sodium chloride 0.9 % 250 mL IVPB  Status:  Discontinued        500 mg 250 mL/hr over 60 Minutes Intravenous Every 24 hours 01/29/20 1850 01/31/20 0732   01/29/20 1545  remdesivir 200 mg in sodium chloride 0.9% 250 mL IVPB       "Followed by" Linked Group Details   200 mg 580 mL/hr over 30 Minutes Intravenous Once 01/29/20 1546 01/29/20 1856        Objective:   Vitals:   02/03/20 1235 02/03/20 2007 02/04/20 0517 02/04/20 1251  BP:  136/79 127/68 139/79 (!) 144/83  Pulse: 80 70 69 84  Resp: (!) 21 18 20  (!) 22  Temp: 98.7 F (37.1 C) 98 F (36.7 C) 98.6 F (37 C) 98.4 F (36.9 C)  TempSrc: Oral Oral Oral Oral  SpO2: 96% 95% 97% 96%  Weight:   (!) 171.7 kg   Height:        Wt Readings from Last 3 Encounters:  02/04/20 (!) 171.7 kg  01/21/20 (!) 155.1 kg  10/23/19 (!) 160 kg     Intake/Output Summary (Last 24 hours) at 02/04/2020 1401 Last data filed at 02/04/2020 1240 Gross per 24 hour  Intake 600 ml  Output 900 ml  Net -300 ml     Physical Exam  Awake Alert, Oriented X 3, No new F.N deficits, Normal affect Symmetrical Chest wall movement, Good air movement bilaterally, CTAB RRR,No Gallops,Rubs or new Murmurs, No Parasternal Heave +ve B.Sounds, Abd Soft, No tenderness, No rebound - guarding or rigidity. No Cyanosis, Clubbing or edema, No new Rash or bruise   .   Data Review:    CBC Recent Labs  Lab 01/30/20 0013 01/30/20 0013 01/31/20 0325 02/01/20 1337 02/02/20 0607 02/03/20 0206  02/04/20 0510  WBC 4.8   < > 2.8* 8.0 6.1 5.3 6.6  HGB 11.2*   < > 10.7* 11.4* 11.2* 10.7* 10.7*  HCT 35.0*   < > 33.6* 37.4 36.6 35.1* 34.1*  PLT 198   < > 201 225 224 262 243  MCV 87.5   < > 88.4 90.1 90.1 90.7 91.2  MCH 28.0   < > 28.2 27.5 27.6 27.6 28.6  MCHC 32.0   < > 31.8 30.5 30.6 30.5 31.4  RDW 13.2   < > 13.2 13.5 13.4 13.2 13.0  LYMPHSABS 0.7  --  0.9 0.7 1.6 1.2  --   MONOABS 0.1  --  0.3 0.5 0.7 0.5  --   EOSABS 0.0  --  0.0 0.0 0.0 0.0  --   BASOSABS 0.0  --  0.0 0.0 0.0 0.0  --    < > = values in this interval not displayed.    Chemistries  Recent Labs  Lab 01/30/20 0013 01/30/20 0013 01/31/20 0325 02/01/20 1337 02/02/20 0607 02/03/20 0206 02/04/20 0510  NA 135   < > 136 137 141 137 137  K 4.3   < > 4.7 4.4 4.0 4.1 3.9  CL 107   < > 108 109 110 109 107  CO2 18*   < > 19* 19* 22 20* 21*  GLUCOSE 258*   < > 374* 415* 235* 262* 185*  BUN 15   < > 25* 18 16 17 17   CREATININE 1.26*   < > 1.68* 1.30* 1.06* 1.08* 0.93  CALCIUM 8.3*   < > 8.3* 8.6* 8.8* 8.5* 8.5*  MG 1.8  --  1.9 1.9 1.8 1.8  --   AST 60*  --  27 22 16  13*  --   ALT 16  --  15 13 12 10   --   ALKPHOS 70  --  70 79 81 75  --   BILITOT 0.5  --  0.5 0.6 0.5 0.6  --    < > = values in this interval not displayed.   ------------------------------------------------------------------------------------------------------------------ No results for input(s): CHOL, HDL, LDLCALC, TRIG, CHOLHDL, LDLDIRECT in the last 72 hours.  Lab Results  Component Value Date   HGBA1C 10.5 (H) 01/30/2020   ------------------------------------------------------------------------------------------------------------------ No results for input(s):  TSH, T4TOTAL, T3FREE, THYROIDAB in the last 72 hours.  Invalid input(s): FREET3 ------------------------------------------------------------------------------------------------------------------ Recent Labs    02/02/20 0607 02/03/20 0206  FERRITIN 447* 453*     Coagulation profile No results for input(s): INR, PROTIME in the last 168 hours.  Recent Labs    02/02/20 0607 02/03/20 0206  DDIMER 1.23* 1.17*    Cardiac Enzymes No results for input(s): CKMB, TROPONINI, MYOGLOBIN in the last 168 hours.  Invalid input(s): CK ------------------------------------------------------------------------------------------------------------------    Component Value Date/Time   BNP 35.8 01/29/2020 1915    Inpatient Medications  Scheduled Meds: . albuterol  2 puff Inhalation Q6H  . vitamin C  500 mg Oral Daily  . aspirin EC  81 mg Oral Daily  . azaTHIOprine  150 mg Oral BID  . buPROPion  300 mg Oral Daily  . carvedilol  25 mg Oral BID WC  . cetaphil  1 application Topical BID  . cycloSPORINE  1 drop Both Eyes BID  . dexamethasone  6 mg Oral Daily  . enoxaparin (LOVENOX) injection  75 mg Subcutaneous Q24H  . famotidine  20 mg Oral BID  . feeding supplement (GLUCERNA SHAKE)  237 mL Oral TID BM  . hydroxychloroquine  200 mg Oral BID  . insulin aspart  0-15 Units Subcutaneous TID WC  . insulin aspart  0-5 Units Subcutaneous QHS  . insulin aspart  8 Units Subcutaneous TID WC  . insulin glargine  55 Units Subcutaneous BID  . levothyroxine  50 mcg Oral Q0600  . loratadine  10 mg Oral Daily  . mometasone-formoterol  2 puff Inhalation BID  . pantoprazole  40 mg Oral Daily  . pravastatin  10 mg Oral Daily  . pregabalin  75 mg Oral TID  . sodium chloride flush  3 mL Intravenous Q12H  . sucralfate  1 g Oral BID  . topiramate  75 mg Oral QHS  . zinc sulfate  220 mg Oral Daily   Continuous Infusions: . sodium chloride Stopped (01/29/20 2205)  . vancomycin 1,250 mg (02/04/20 1055)   PRN Meds:.sodium chloride, acetaminophen, ALPRAZolam, chlorpheniramine-HYDROcodone, diclofenac Sodium, doxepin, fluticasone, guaiFENesin-dextromethorphan, loperamide, ondansetron **OR** ondansetron (ZOFRAN) IV, oxyCODONE, promethazine, traMADol  Micro  Results Recent Results (from the past 240 hour(s))  SARS Coronavirus 2 by RT PCR (hospital order, performed in [facility] hospital lab) Nasopharyngeal Nasopharyngeal Swab     Status: Abnormal   Collection Time: 01/29/20  9:56 AM   Specimen: Nasopharyngeal Swab  Result Value Ref Range Status   SARS Coronavirus 2 POSITIVE (A) NEGATIVE Final    Comment: RESULT CALLED TO, READ BACK BY AND VERIFIED WITH: RN LOUIE CHILTON 0017 01/29/20 KB (NOTE) SARS-CoV-2 target nucleic acids are DETECTED  SARS-CoV-2 RNA is generally detectable in upper respiratory specimens  during the acute phase of infection.  Positive results are indicative  of the presence of the identified virus, but do not rule out bacterial infection or co-infection with other pathogens not detected by the test.  Clinical correlation with patient history and  other diagnostic information is necessary to determine patient infection status.  The expected result is negative.  Fact Sheet for Patients:   StrictlyIdeas.no   Fact Sheet for Healthcare Providers:   BankingDealers.co.za    This test is not yet approved or cleared by the [place] FDA and  has been authorized for detection and/or diagnosis of SARS-CoV-2 by FDA under an Emergency Use Authorization (EUA).  This EUA will remain in effect (meaning this tes t can be used)  for the duration of  the COVID-19 declaration under Section 564(b)(1) of the Act, 21 U.S.C. section 360-bbb-3(b)(1), unless the authorization is terminated or revoked sooner.  Performed at [facility], [place] [address] [place], [place] [address]   Culture, Urine     Status: Abnormal   Collection Time: 01/29/20  2:06 PM   Specimen: Urine, Random  Result Value Ref Range Status   Specimen Description URINE, RANDOM  Final   Special Requests NONE  Final   Culture (A)  Final    >=100,000 COLONIES/mL DIPHTHEROIDS(CORYNEBACTERIUM SPECIES) Standardized  susceptibility testing for this organism is not available. Performed at [facility], [place] [address] [place], [place] [address]    Report Status 01/30/2020 FINAL  Final  Culture, blood (Routine X 2) w Reflex to ID Panel     Status: Abnormal   Collection Time: 01/29/20  6:57 PM   Specimen: BLOOD  Result Value Ref Range Status   Specimen Description BLOOD RIGHT ANTECUBITAL  Final   Special Requests   Final    BOTTLES DRAWN AEROBIC AND ANAEROBIC Blood Culture adequate volume   Culture  Setup Time   Final    IN BOTH AEROBIC AND ANAEROBIC BOTTLES GRAM POSITIVE RODS CRITICAL RESULT CALLED TO, READ BACK BY AND VERIFIED WITH: Salome Holmes [facility] 01/30/20 1938 JDW    Culture (A)  Final    CORYNEBACTERIUM STRIATUM ACTINOMYCES NEUII Standardized susceptibility testing for this organism is not available. Performed at [facility], [place] [address], [place],  [place] [address]    Report Status 02/02/2020 FINAL  Final  Culture, blood (Routine X 2) w Reflex to ID Panel     Status: Abnormal   Collection Time: 01/29/20  7:05 PM   Specimen: BLOOD LEFT HAND  Result Value Ref Range Status   Specimen Description BLOOD LEFT HAND  Final   Special Requests   Final    BOTTLES DRAWN AEROBIC AND ANAEROBIC Blood Culture adequate volume   Culture  Setup Time   Final    ANAEROBIC BOTTLE ONLY GRAM POSITIVE RODS CRITICAL VALUE NOTED.  VALUE IS CONSISTENT WITH PREVIOUSLY REPORTED AND CALLED VALUE.    Culture (A)  Final    CORYNEBACTERIUM STRIATUM Standardized susceptibility testing for this organism is not available. Performed at [facility], [place] [address], [place], [place] [address]    Report Status 02/01/2020 FINAL  Final  Culture, blood (Routine X 2) w Reflex to ID Panel     Status: None (Preliminary result)   Collection Time: 02/02/20  6:03 AM   Specimen: BLOOD  Result Value Ref Range Status   Specimen Description BLOOD LEFT ANTECUBITAL  Final   Special Requests   Final    BOTTLES DRAWN  AEROBIC AND ANAEROBIC Blood Culture adequate volume   Culture   Final    NO GROWTH 2 DAYS Performed at [facility], [place] [address], [place], [place] [address]    Report Status PENDING  Incomplete  Culture, blood (Routine X 2) w Reflex to ID Panel     Status: None (Preliminary result)   Collection Time: 02/02/20  6:03 AM   Specimen: BLOOD  Result Value Ref Range Status   Specimen Description BLOOD RIGHT ANTECUBITAL  Final   Special Requests   Final    BOTTLES DRAWN AEROBIC AND ANAEROBIC Blood Culture adequate volume   Culture   Final    NO GROWTH 2 DAYS Performed at [facility], [place]  [address], [place], [place] [address]    Report Status PENDING  Incomplete    Radiology Reports CT ABDOMEN PELVIS W CONTRAST  Result Date: 01/29/2020 CLINICAL DATA:  Fever, body aches, and sore throat for the past 2 days. Lower abdominal pain. COVID-19 positive. EXAM: CT ABDOMEN AND PELVIS WITH CONTRAST TECHNIQUE: Multidetector CT imaging of the abdomen and pelvis was performed using the standard protocol following bolus administration of intravenous contrast. CONTRAST:  173mL OMNIPAQUE IOHEXOL 300 MG/ML  SOLN COMPARISON:  CT abdomen pelvis dated October 23, 2019. FINDINGS: Lower chest: Small subpleural ground-glass densities at the lung bases with more focal consolidation versus atelectasis in the left lower lobe. Hepatobiliary: Heterogeneous hepatic parenchyma and ill-defined hypodense liver lesions are less conspicuous when compared to the prior study due to different phase of contrast. No obvious significant interval change. No new focal liver abnormality. The gallbladder is unremarkable. No biliary dilatation. Pancreas: Unremarkable. No pancreatic ductal dilatation or surrounding inflammatory changes. Spleen: Normal in size without focal abnormality. Adrenals/Urinary Tract: Adrenal glands are unremarkable. Unchanged punctate calculus in the upper pole of the right kidney. No hydronephrosis. The  bladder is unremarkable. Stomach/Bowel: Stomach is within normal limits. Appendix appears normal. No evidence of bowel wall thickening, distention, or inflammatory changes. Vascular/Lymphatic: No significant vascular findings are present. No enlarged abdominal or pelvic lymph nodes. Reproductive: Uterus and bilateral adnexa are unremarkable. Other: No abdominal wall hernia or abnormality. No abdominopelvic ascites. No pneumoperitoneum. Musculoskeletal: No acute or significant osseous findings. IMPRESSION: 1. No acute intra-abdominal process. 2. Small subpleural ground-glass densities at the lung bases with more focal consolidation versus atelectasis in the left lower lobe. Findings are suspicious for COVID-19 pneumonia given clinical history. 3. Heterogeneous hepatic parenchyma and ill-defined hypodense liver lesions are less conspicuous when compared to the prior study due to different phase of contrast, but there is no obvious significant interval change. 4. Unchanged nonobstructive right nephrolithiasis. Electronically Signed   By: Titus Dubin M.D.   On: 01/29/2020 14:06   DG CHEST PORT 1 VIEW  Result Date: 02/01/2020 CLINICAL DATA:  COVID-19 positive patient.  Shortness of breath. EXAM: PORTABLE CHEST 1 VIEW COMPARISON:  Single-view of the chest 01/29/2020 and 10/23/2019. FINDINGS: There is new patchy bilateral airspace disease best seen in the upper lung zones and periphery of the right lung base. Cardiomegaly and vascular congestion again noted. IMPRESSION: New patchy bilateral airspace disease most consistent with pneumonia. Chronic cardiomegaly and vascular congestion. Electronically Signed   By: Inge Rise M.D.   On: 02/01/2020 13:50   DG Chest Portable 1 View  Result Date: 01/29/2020 CLINICAL DATA:  Dyspnea, fever, nausea and vomiting. EXAM: PORTABLE CHEST 1 VIEW COMPARISON:  Single-view of the chest 06/24/2020 and 08/13/2019. CT chest 10/05/2019. FINDINGS: There is cardiomegaly and  pulmonary vascular congestion. No consolidative process, pneumothorax or effusion is identified. No acute or focal bony abnormality. IMPRESSION: Cardiomegaly and pulmonary vascular congestion. Electronically Signed   By: Inge Rise M.D.   On: 01/29/2020 09:13   ECHOCARDIOGRAM LIMITED  Result Date: 02/02/2020    ECHOCARDIOGRAM LIMITED REPORT   Patient Name:   [name] Date of Exam: 02/02/2020 Medical Rec #:  [id number]     Height:       68.0 in Accession #:    [id number]    Weight:       373.5 lb Date of Birth:  May 01, 1971     BSA:          2.665 m Patient Age:  49 years      BP:           142/77 mmHg Patient Gender: F             HR:           74 bpm. Exam Location:  [facility] Procedure: Limited Echo, Cardiac Doppler and Color Doppler Indications:    Bacteremia 790.7/R78.81  History:        Patient has prior history of Echocardiogram examinations, most                 recent 05/11/2018. Risk Factors:Morbid Obesity. CV19 positive.  Sonographer:    [name] RDCS Referring Phys: [place]  Sonographer Comments: This was a limited echo to rule out endocarditis in a patient positive for CV19. IMPRESSIONS  1. Left ventricular ejection fraction, by estimation, is 40 to 45%. The left ventricle has mildly decreased function. The left ventricle demonstrates global hypokinesis. Left ventricular diastolic function could not be evaluated.  2. Right ventricular systolic function is normal. The right ventricular size is mildly enlarged. Tricuspid regurgitation signal is inadequate for assessing PA pressure.  3. The mitral valve is normal in structure. Trivial mitral valve regurgitation.  4. The aortic valve is normal in structure. Aortic valve regurgitation is not visualized. No aortic stenosis is present. Conclusion(s)/Recommendation(s): No evidence of valvular vegetations on this transthoracic echocardiogram. Would recommend a transesophageal echocardiogram to exclude infective endocarditis if  clinically indicated. FINDINGS  Left Ventricle: Left ventricular ejection fraction, by estimation, is 40 to 45%. The left ventricle has mildly decreased function. The left ventricle demonstrates global hypokinesis. The left ventricular internal cavity size was normal in size. There is  no left ventricular hypertrophy. Right Ventricle: The right ventricular size is mildly enlarged. No increase in right ventricular wall thickness. Right ventricular systolic function is normal. Tricuspid regurgitation signal is inadequate for assessing PA pressure. Left Atrium: Left atrial size was normal in size. Right Atrium: Right atrial size was normal in size. Pericardium: There is no evidence of pericardial effusion. Mitral Valve: The mitral valve is normal in structure. There is mild thickening of the mitral valve leaflet(s). Trivial mitral valve regurgitation. Tricuspid Valve: The tricuspid valve is normal in structure. Tricuspid valve regurgitation is trivial. There is no evidence of tricuspid valve vegetation. Aortic Valve: The aortic valve is normal in structure. Aortic valve regurgitation is not visualized. No aortic stenosis is present. Pulmonic Valve: The pulmonic valve was not well visualized. Pulmonic valve regurgitation is trivial. Aorta: The aortic root is normal in size and structure. Venous: The inferior vena cava was not well visualized. IAS/Shunts: The interatrial septum was not well visualized.  LEFT VENTRICLE PLAX 2D LVIDd:         6.00 cm LVIDs:         4.50 cm LV PW:         1.00 cm LV IVS:        0.90 cm LVOT diam:     2.10 cm LVOT Area:     3.46 cm  LEFT ATRIUM         Index LA diam:    3.50 cm 1.31 cm/m   AORTA Ao Root diam: 3.10 cm Ao Asc diam:  3.50 cm  SHUNTS Systemic Diam: 2.10 cm Adrian Prows MD Electronically signed by Adrian Prows MD Signature Date/Time: 02/02/2020/1:01:46 PM    Final      Phillips Climes M.D on 02/04/2020 at 2:01 PM    [facility] -  Office  [phone]

## 2020-02-04 NOTE — Progress Notes (Signed)
Pharmacy Antibiotic Note  GHADEER KASTELIC is a 49 y.o. female admitted on 01/28/2020 with SOB and positive for COVID-19. [facility] for vancomycin dosing given that pt is chronically immunosuppressed. Cr down this morning, pt afebrile.  Blood culture 3/4 bottles grew corynebacterium striatum.  One bottle is also growing actinomyces neuii.  Lab does not report sensitivities to these organisms, but are usually sensitive to IV vancomycin  WBC wnl, afeb,  Day #5 of Vancomycin.   Scr 0.9 stable down from peak of 1.68 on 7/2 Vancomycin trough = 13 which is therapeutic    Plan: Continue Vancomycin 1250mg  IV q12h.  Total length of therapy is recommended by ID to be 14 total days antibiotics of IV vancomycin or linezolid  Monitor clinical progress, renal function.   Height: 5\' 8"  (172.7 cm) Weight: (!) 171.7 kg (378 lb 8.5 oz) IBW/kg (Calculated) : 63.9  Temp (24hrs), Avg:98.4 F (36.9 C), Min:98 F (36.7 C), Max:98.7 F (37.1 C)  Recent Labs  Lab 01/29/20 0950 01/30/20 0013 01/31/20 0325 02/01/20 1337 02/02/20 0607 02/03/20 0206 02/04/20 0510 02/04/20 0836  WBC  --    < > 2.8* 8.0 6.1 5.3 6.6  --   CREATININE  --    < > 1.68* 1.30* 1.06* 1.08* 0.93  --   LATICACIDVEN 1.4  --   --   --   --   --   --   --   VANCOTROUGH  --   --   --   --   --   --   --  13*   < > = values in this interval not displayed.    Estimated Creatinine Clearance: 123.6 mL/min (by C-G formula based on SCr of 0.93 mg/dL).     Antimicrobials this admission: Remdesivir 6/30>>7/4 Azithro 6/30 >>7/2 Rocephin 6/30 >>7/2 Unasyn 7/2>>7/3 Vanc 7/1>>   Microbiology results: 6/30 BCx: 3/4 corynebacterium striatum, one bottle with actinomyces neuii 6/30 urine>>diptheroids 7/4 BCx x2: ngtd  Thank you for allowing pharmacy to be part of this patients care team. Heide Guile, PharmD, BCPS-AQ ID Clinical Pharmacist [phone]  Please check AMION for all [place] phone numbers After 10:00  PM, call [place] [phone]  02/04/2020 10:24 AM

## 2020-02-04 NOTE — TOC Initial Note (Signed)
Transition of Care [facility]) - Initial/Assessment Note    Patient Details  Name: [name] MRN: [id number] Date of Birth: 1971-04-16  [facility]) CM/SW Contact:    Carles Collet, RN Phone Number: 02/04/2020, 3:40 PM  Clinical Narrative:              Patient from home alone. Plan to DC to home w [facility] services.   Will have [place] arranged through [facility]. TOC will continue to follow for DME needs. Will potentially need bariatric RW and O2.    Expected Discharge Plan: [place] Barriers to Discharge: Continued Medical Work up   Patient Goals and CMS Choice Patient states their goals for this hospitalization and ongoing recovery are:: to go home CMS Medicare.gov Compare Post Acute Care list provided to:: Patient Choice offered to / list presented to : Patient  Expected Discharge Plan and Services Expected Discharge Plan: [place] Choice: [place] arrangements for the past 2 months: Single Family Home                           HH Arranged: PT, OT HH Agency: [place] Date [facility] Agency Contacted: 02/04/20 Time [place] Agency Contacted: [phone] Representative spoke with at [place]: Tommi Rumps  Prior Living Arrangements/Services Living arrangements for the past 2 months: [place]   Patient language and need for interpreter reviewed:: Yes Do you feel safe going back to the place where you live?: Yes      Need for Family Participation in Patient Care: Yes (Comment) Care giver support system in place?: Yes (comment)   Criminal Activity/Legal Involvement Pertinent to Current Situation/Hospitalization: No - Comment as needed  Activities of Daily Living Home Assistive Devices/Equipment: Cane (specify quad or straight) ADL Screening (condition at time of admission) Patient's cognitive ability adequate to safely complete daily activities?: Yes Is the patient deaf or have difficulty hearing?:  No Does the patient have difficulty seeing, even when wearing glasses/contacts?: No Does the patient have difficulty concentrating, remembering, or making decisions?: No Patient able to express need for assistance with ADLs?: Yes Does the patient have difficulty dressing or bathing?: No Independently performs ADLs?: Yes (appropriate for developmental age) Does the patient have difficulty walking or climbing stairs?: No Weakness of Legs: Both Weakness of Arms/Hands: None  Permission Sought/Granted                  Emotional Assessment Appearance:: Appears older than stated age       Alcohol / Substance Use: Tobacco Use Psych Involvement: No (comment)  Admission diagnosis:  Pneumonia due to COVID-19 virus [U07.1, J12.82] COVID-19 [U07.1] Patient Active Problem List   Diagnosis Date Noted  . Bacteremia   . Pneumonia due to COVID-19 virus 01/29/2020  . Pressure injury of skin 08/30/2019  . Physical debility 08/29/2019  . Obesity hypoventilation syndrome ([place]) 08/23/2019  . Skin breakdown 08/23/2019  . Essential hypertension 08/23/2019  . Liver hemorrhage 08/23/2019  . Acute blood loss anemia 08/13/2019  . Hepatic adenoma   . Hemoperitoneum   . Diabetic acidosis without coma ([place])   . Multifocal pneumonia 09/23/2017  . Acute respiratory failure ([place]) 09/21/2017  . CHF exacerbation ([place]) 09/20/2017  . Costochondritis 06/06/2017  . Chest pain 06/04/2017  . Diabetes ([place]) 09/19/2015  . Radiculopathy of cervical spine   . Severe headache 03/05/2014  . Family  history of malignant neoplasm of breast   . Family history of malignant neoplasm of ovary   . Intractable nausea and vomiting 11/11/2013  . Septate uterus 09/08/2013  . Diabetic gastroparesis ([place]) 04/28/2013  . Constipation due to pain medication 04/21/2013  . Odynophagia 04/04/2013  . Liver masses 02/28/2013  . Reflux esophagitis 02/26/2013  . Morbid obesity ([place]) 10/10/2012  . Chronic diastolic CHF  (congestive heart failure) ([place]) 10/09/2012  . Chronic respiratory failure with hypoxia ([place]) 10/09/2012  . Clinical polymyositis syndrome ([place]) 09/06/2012  . Mononeuritis of lower limb 01/12/2012  . HTN (hypertension) 11/08/2011  . Chronic systolic CHF (congestive heart failure) ([place]) 11/08/2011  . HLD (hyperlipidemia) 07/19/2011  . Bell's palsy 08/09/2010  . SLE 08/09/2010  . [place] SYNDROME 08/09/2010   PCP:  Bartholome Bill, MD Pharmacy:   [id number] - [place], [address] [place] [address] Phone: [phone] Fax: [phone]  [facility] - [place], [place] [address] [place] [address] [place] [address] Phone: [phone] Fax: [phone]     Social Determinants of Health (SDOH) Interventions    Readmission Risk Interventions No flowsheet data found.

## 2020-02-04 NOTE — Progress Notes (Signed)
Inpatient Diabetes Program Recommendations  AACE/ADA: New Consensus Statement on Inpatient Glycemic Control (2015)  Target Ranges:  Prepandial:   less than 140 mg/dL      Peak postprandial:   less than 180 mg/dL (1-2 hours)      Critically ill patients:  140 - 180 mg/dL   Lab Results  Component Value Date   GLUCAP 189 (H) 02/04/2020   HGBA1C 10.5 (H) 01/30/2020    Review of Glycemic Control  Results for ERETRIA, MANTERNACH (MRN [id number]) as of 02/04/2020 13:38  Ref. Range 02/03/2020 12:02 02/03/2020 17:00 02/03/2020 20:47 02/04/2020 08:06 02/04/2020 12:15  Glucose-Capillary Latest Ref Range: 70 - 99 mg/dL 214 (H) 273 (H) 253 (H) 135 (H) 189 (H)    Inpatient Diabetes Program Recommendations:   Novolog 10 units tid with meals if eats at least 50%  Will continue to follow while inpatient.  Thank you, Reche Dixon, RN, BSN Diabetes Coordinator Inpatient Diabetes Program [phone] (team pager from 8a-5p)

## 2020-02-05 ENCOUNTER — Telehealth: Payer: Self-pay | Admitting: Endocrinology

## 2020-02-05 ENCOUNTER — Other Ambulatory Visit: Payer: Self-pay | Admitting: Endocrinology

## 2020-02-05 ENCOUNTER — Other Ambulatory Visit: Payer: Self-pay | Admitting: Cardiology

## 2020-02-05 DIAGNOSIS — Z794 Long term (current) use of insulin: Secondary | ICD-10-CM

## 2020-02-05 DIAGNOSIS — E1143 Type 2 diabetes mellitus with diabetic autonomic (poly)neuropathy: Secondary | ICD-10-CM

## 2020-02-05 DIAGNOSIS — I5032 Chronic diastolic (congestive) heart failure: Secondary | ICD-10-CM

## 2020-02-05 LAB — BASIC METABOLIC PANEL
Anion gap: 8 (ref 5–15)
BUN: 18 mg/dL (ref 6–20)
CO2: 22 mmol/L (ref 22–32)
Calcium: 8.6 mg/dL — ABNORMAL LOW (ref 8.9–10.3)
Chloride: 108 mmol/L (ref 98–111)
Creatinine, Ser: 0.99 mg/dL (ref 0.44–1.00)
GFR calc Af Amer: >60 mL/min (ref >60)
GFR calc non Af Amer: >60 mL/min (ref >60)
Glucose, Bld: 208 mg/dL — ABNORMAL HIGH (ref 70–99)
Potassium: 4 mmol/L (ref 3.5–5.1)
Sodium: 138 mmol/L (ref 135–145)

## 2020-02-05 LAB — GLUCOSE, CAPILLARY
Glucose-Capillary: 184 mg/dL — ABNORMAL HIGH (ref 70–99)
Glucose-Capillary: 317 mg/dL — ABNORMAL HIGH (ref 70–99)
Glucose-Capillary: 311 mg/dL — ABNORMAL HIGH (ref 70–99)
Glucose-Capillary: 316 mg/dL — ABNORMAL HIGH (ref 70–99)

## 2020-02-05 LAB — CBC
HCT: 35.1 % — ABNORMAL LOW (ref 36.0–46.0)
Hemoglobin: 10.5 g/dL — ABNORMAL LOW (ref 12.0–15.0)
MCH: 27.4 pg (ref 26.0–34.0)
MCHC: 29.9 g/dL — ABNORMAL LOW (ref 30.0–36.0)
MCV: 91.6 fL (ref 80.0–100.0)
Platelets: 255 10*3/uL (ref 150–400)
RBC: 3.83 MIL/uL — ABNORMAL LOW (ref 3.87–5.11)
RDW: 12.9 % (ref 11.5–15.5)
WBC: 8.4 10*3/uL (ref 4.0–10.5)
nRBC: 0.2 % (ref 0.0–0.2)

## 2020-02-05 MED ORDER — DEXAMETHASONE 4 MG PO TABS
4.0000 mg | ORAL_TABLET | Freq: Every day | ORAL | Status: DC
  Administered 2020-02-06: 4 mg via ORAL
  Filled 2020-02-05: qty 1

## 2020-02-05 MED ORDER — FUROSEMIDE 10 MG/ML IJ SOLN
40.0000 mg | Freq: Once | INTRAMUSCULAR | Status: AC
  Administered 2020-02-05: 40 mg via INTRAVENOUS
  Filled 2020-02-05: qty 4

## 2020-02-05 MED ORDER — PHENOL 1.4 % MT LIQD
1.0000 | OROMUCOSAL | Status: DC | PRN
  Filled 2020-02-05: qty 177

## 2020-02-05 NOTE — Progress Notes (Signed)
PROGRESS NOTE                                                                                                                                                                                                             Patient Demographics:    [name], is a 49 y.o. female, DOB - 12/16/1970, OEV:[id number]  Outpatient Primary MD for the patient is Bartholome Bill, MD   Admit date - 01/28/2020   LOS - 7  Chief Complaint  Patient presents with  . COVID like symptoms       Brief Narrative: Patient is a 49 y.o. female with PMHx of lupus, chronic systolic heart failure, DM-2, morbid obesity-hold presented with shortness of breath-found to have fever and acute hypoxic respiratory failure secondary to COVID-19 pneumonia along with Corynebacterium bacteremia.  Significant Events: 6/29>> admit to [facility] for hypoxia from COVID-19.  Significant studies: 6/30>> CT abdomen/pelvis: No acute intra-abdominal process 7/3>> chest x-ray: New patchy bilateral airspace disease consistent with pneumonia. 7/4>> echo: EF 40-45%, LV demonstrates global hypokinesis.  COVID-19 medications: Steroids: 6/30>> Remdesivir: 6/30>> 7/4  Antibiotics: Zithromax: 6/30>> 7/1 Rocephin: 6/30>> 7/1 Unasyn: 7/2>> 7/20 Vancomycin: 7/1>>  Microbiology data: 6/30: Urine culture>> Courtney bacterium 6/30: Blood culture>> Corynebacterium, actinomyces 7/4: Blood culture>> no growth  Procedures: None  Consults: None  DVT prophylaxis: Restart Lovenox on 7/7    Subjective:    [name] today is slowly improving-she appears very weak/deconditioned-was titrated to room air today.  But continues to complain of shortness of breath with minimal exertion.   Assessment  & Plan :   Sepsis with acute Hypoxic Resp Failure due to Covid 19 Viral pneumonia: Sepsis physiology has resolved-hypoxia has improved-she is on room air at rest-but requires 3-4 L  of oxygen with minimal exertion.  Patient is 3 L net positive-has some mild edema in lower extremities-1 dose of IV Lasix today-and resume usual home diuretic regimen tomorrow.  Fever: afebrile Prone/Incentive Spirometry: encourage incentive spirometry use 3-4/hour. O2 requirements:  SpO2: 100 % O2 Flow Rate (L/min): 1 L/min   COVID-19 Labs: Recent Labs    02/03/20 0206  DDIMER 1.17*  FERRITIN 453*  CRP 0.9       Component Value Date/Time   BNP 35.8 01/29/2020 1915    Recent Labs  Lab 01/29/20 1915 01/31/20 0822  PROCALCITON <0.10 <0.10  Lab Results  Component Value Date   [place] (A) 01/29/2020   [place] NEGATIVE 10/23/2019   [place] NEGATIVE 08/24/2019   [place] NEGATIVE 08/12/2019     Corynebacterium/actinomyces bacteremia with UTI: Prior MD-Dr. Riki Altes discussed with ID-recommendations were for total of 14 days of antibiotics.  Continue IV vancomycin while hospitalized-with plans to transition to Zyvox upon discharge.  Per prior notes-echo finding was discussed with Dr. Gwenlyn Perking evidence of vegetation.  Chronic systolic heart failure: Although no overt signs of volume overload-given exertional dyspnea-we will restart diuretic regimen-see above.  Remains on Coreg and Entresto.  AKI: Likely hemodynamically mediated-resolved.  Insulin-dependent DM-2 (A1c 10.5 on 7/1) with uncontrolled hyperglycemia due to steroids: CBGs relatively well controlled-since steroid being tapered down-continue to monitor for 1 additional day before adjusting further.  Remains on 55 units of Lantus twice daily, 8 units of NovoLog with meals-and SSI.  Recent Labs    02/04/20 1706 02/04/20 2108 02/05/20 0759  GLUCAP 307* 288* 184*   Hypothyroidism: Continue Synthroid  HLD: Continue statin  GERD: Continue PPI/Carafate  Lupus: Stable-continue azathioprine, hydroxychloroquine-already on steroids for COVID-19.  OSA: Resume CPAP nightly  Morbid  Obesity: Estimated body mass index is 57.56 kg/m as calculated from the following:   Height as of this encounter: 5\' 8"  (1.727 m).   Weight as of this encounter: 171.7 kg.   RN pressure injury documentation: Pressure Injury 08/29/19 Buttocks Left Stage 2 -  Partial thickness loss of dermis presenting as a shallow open injury with a red, pink wound bed without slough. pink, open area (Active)  08/29/19 2157  Location: Buttocks  Location Orientation: Left  Staging: Stage 2 -  Partial thickness loss of dermis presenting as a shallow open injury with a red, pink wound bed without slough.  Wound Description (Comments): pink, open area  Present on Admission: Yes    GI prophylaxis: PPI  ABG:    Component Value Date/Time   PHART 7.285 (L) 08/14/2019 0517   PCO2ART 38.9 08/14/2019 0517   PO2ART 88.3 08/14/2019 0517   HCO3 18.0 (L) 08/14/2019 0517   TCO2 17 (L) 08/12/2019 1523   ACIDBASEDEF 7.5 (H) 08/14/2019 0517   O2SAT 96.0 08/14/2019 0517    Vent Settings: N/A  Condition - Stable  Family Communication  : We will update family over the next few days.  Code Status :  Full Code  Diet :  Diet Order            Diet heart healthy/carb modified Room service appropriate? Yes; Fluid consistency: Thin  Diet effective now                  Disposition Plan  : Status is: Inpatient  Remains inpatient appropriate because:Inpatient level of care appropriate due to severity of illness   Dispo: The patient is from: Home              Anticipated d/c is to: Home              Anticipated d/c date is: 2 days              Patient currently is not medically stable to d/c.   Barriers to discharge: Hypoxia requiring O2 supplementation-requiring IV Lasix  Antimicorbials  :    Anti-infectives (From admission, onward)   Start     Dose/Rate Route Frequency Ordered Stop   01/31/20 1400  Ampicillin-Sulbactam (UNASYN) 3 g in sodium chloride 0.9 % 100 mL IVPB  Status:  Discontinued  3 g 200 mL/hr over 30 Minutes Intravenous Every 6 hours 01/31/20 1315 02/01/20 1316   01/31/20 0900  vancomycin (VANCOREADY) IVPB 1250 mg/250 mL     Discontinue     1,250 mg 166.7 mL/hr over 90 Minutes Intravenous Every 12 hours 01/30/20 2024     01/30/20 2100  vancomycin (VANCOCIN) 2,500 mg in sodium chloride 0.9 % 500 mL IVPB        2,500 mg 250 mL/hr over 120 Minutes Intravenous  Once 01/30/20 2024 01/31/20 0235   01/30/20 1000  remdesivir 100 mg in sodium chloride 0.9 % 100 mL IVPB       "Followed by" Linked Group Details   100 mg 200 mL/hr over 30 Minutes Intravenous Daily 01/29/20 1546 02/02/20 1209   01/29/20 2200  hydroxychloroquine (PLAQUENIL) tablet 200 mg     Discontinue     200 mg Oral 2 times daily 01/29/20 1546     01/29/20 2000  cefTRIAXone (ROCEPHIN) 2 g in sodium chloride 0.9 % 100 mL IVPB  Status:  Discontinued        2 g 200 mL/hr over 30 Minutes Intravenous Every 24 hours 01/29/20 1850 01/31/20 0732   01/29/20 2000  azithromycin (ZITHROMAX) 500 mg in sodium chloride 0.9 % 250 mL IVPB  Status:  Discontinued        500 mg 250 mL/hr over 60 Minutes Intravenous Every 24 hours 01/29/20 1850 01/31/20 0732   01/29/20 1545  remdesivir 200 mg in sodium chloride 0.9% 250 mL IVPB       "Followed by" Linked Group Details   200 mg 580 mL/hr over 30 Minutes Intravenous Once 01/29/20 1546 01/29/20 1856      Inpatient Medications  Scheduled Meds: . albuterol  2 puff Inhalation Q6H  . vitamin C  500 mg Oral Daily  . aspirin EC  81 mg Oral Daily  . azaTHIOprine  150 mg Oral BID  . buPROPion  300 mg Oral Daily  . carvedilol  25 mg Oral BID WC  . cetaphil  1 application Topical BID  . cycloSPORINE  1 drop Both Eyes BID  . dexamethasone  6 mg Oral Daily  . enoxaparin (LOVENOX) injection  80 mg Subcutaneous Q24H  . famotidine  20 mg Oral BID  . feeding supplement (GLUCERNA SHAKE)  237 mL Oral TID BM  . hydroxychloroquine  200 mg Oral BID  . insulin aspart  0-15 Units  Subcutaneous TID WC  . insulin aspart  0-5 Units Subcutaneous QHS  . insulin aspart  8 Units Subcutaneous TID WC  . insulin glargine  55 Units Subcutaneous BID  . levothyroxine  50 mcg Oral Q0600  . loratadine  10 mg Oral Daily  . mometasone-formoterol  2 puff Inhalation BID  . pantoprazole  40 mg Oral Daily  . pravastatin  10 mg Oral Daily  . pregabalin  75 mg Oral TID  . sacubitril-valsartan  1 tablet Oral BID  . sodium chloride flush  3 mL Intravenous Q12H  . sucralfate  1 g Oral BID  . topiramate  75 mg Oral QHS  . zinc sulfate  220 mg Oral Daily   Continuous Infusions: . sodium chloride Stopped (01/29/20 2205)  . vancomycin 1,250 mg (02/05/20 0931)   PRN Meds:.sodium chloride, acetaminophen, ALPRAZolam, chlorpheniramine-HYDROcodone, diclofenac Sodium, doxepin, fluticasone, guaiFENesin-dextromethorphan, loperamide, ondansetron **OR** ondansetron (ZOFRAN) IV, oxyCODONE, promethazine, traMADol   Time Spent in minutes  2   See all Orders from today for further details   [employer]  M.D on 02/05/2020 at 10:28 AM  To page go to [email or web address] - use universal password  [facility] -  Office  [phone]    Objective:   Vitals:   02/04/20 1251 02/04/20 2016 02/05/20 0456 02/05/20 0920  BP: (!) 144/83 127/74 139/72 135/76  Pulse: 84 76 67 75  Resp: (!) 22 (!) 21 15 17   Temp: 98.4 F (36.9 C) 98.7 F (37.1 C) 98.7 F (37.1 C) 98 F (36.7 C)  TempSrc: Oral Oral Oral Oral  SpO2: 96% 100% 100% 100%  Weight:      Height:        Wt Readings from Last 3 Encounters:  02/04/20 (!) 171.7 kg  01/21/20 (!) 155.1 kg  10/23/19 (!) 160 kg     Intake/Output Summary (Last 24 hours) at 02/05/2020 1028 Last data filed at 02/05/2020 0931 Gross per 24 hour  Intake 793 ml  Output 704 ml  Net 89 ml     Physical Exam Gen Exam:Alert awake-not in any distress HEENT:atraumatic, normocephalic Chest: B/L clear to auscultation anteriorly CVS:S1S2 regular Abdomen:soft non  tender, non distended Extremities:+ edema Neurology: Non focal Skin: no rash   Data Review:    CBC Recent Labs  Lab 01/30/20 0013 01/30/20 0013 01/31/20 0325 01/31/20 0325 02/01/20 1337 02/02/20 0607 02/03/20 0206 02/04/20 0510 02/05/20 0239  WBC 4.8   < > 2.8*   < > 8.0 6.1 5.3 6.6 8.4  HGB 11.2*   < > 10.7*   < > 11.4* 11.2* 10.7* 10.7* 10.5*  HCT 35.0*   < > 33.6*   < > 37.4 36.6 35.1* 34.1* 35.1*  PLT 198   < > 201   < > 225 224 262 243 255  MCV 87.5   < > 88.4   < > 90.1 90.1 90.7 91.2 91.6  MCH 28.0   < > 28.2   < > 27.5 27.6 27.6 28.6 27.4  MCHC 32.0   < > 31.8   < > 30.5 30.6 30.5 31.4 29.9*  RDW 13.2   < > 13.2   < > 13.5 13.4 13.2 13.0 12.9  LYMPHSABS 0.7  --  0.9  --  0.7 1.6 1.2  --   --   MONOABS 0.1  --  0.3  --  0.5 0.7 0.5  --   --   EOSABS 0.0  --  0.0  --  0.0 0.0 0.0  --   --   BASOSABS 0.0  --  0.0  --  0.0 0.0 0.0  --   --    < > = values in this interval not displayed.    Chemistries  Recent Labs  Lab 01/30/20 0013 01/30/20 0013 01/31/20 0325 01/31/20 0325 02/01/20 1337 02/02/20 0607 02/03/20 0206 02/04/20 0510 02/05/20 0239  NA 135   < > 136   < > 137 141 137 137 138  K 4.3   < > 4.7   < > 4.4 4.0 4.1 3.9 4.0  CL 107   < > 108   < > 109 110 109 107 108  CO2 18*   < > 19*   < > 19* 22 20* 21* 22  GLUCOSE 258*   < > 374*   < > 415* 235* 262* 185* 208*  BUN 15   < > 25*   < > 18 16 17 17 18   CREATININE 1.26*   < > 1.68*   < > 1.30* 1.06* 1.08* 0.93 0.99  CALCIUM 8.3*   < >  8.3*   < > 8.6* 8.8* 8.5* 8.5* 8.6*  MG 1.8  --  1.9  --  1.9 1.8 1.8  --   --   AST 60*  --  27  --  22 16 13*  --   --   ALT 16  --  15  --  13 12 10   --   --   ALKPHOS 70  --  70  --  79 81 75  --   --   BILITOT 0.5  --  0.5  --  0.6 0.5 0.6  --   --    < > = values in this interval not displayed.   ------------------------------------------------------------------------------------------------------------------ No results for input(s): CHOL, HDL, LDLCALC, TRIG,  CHOLHDL, LDLDIRECT in the last 72 hours.  Lab Results  Component Value Date   HGBA1C 10.5 (H) 01/30/2020   ------------------------------------------------------------------------------------------------------------------ No results for input(s): TSH, T4TOTAL, T3FREE, THYROIDAB in the last 72 hours.  Invalid input(s): FREET3 ------------------------------------------------------------------------------------------------------------------ Recent Labs    02/03/20 0206  FERRITIN 453*    Coagulation profile No results for input(s): INR, PROTIME in the last 168 hours.  Recent Labs    02/03/20 0206  DDIMER 1.17*    Cardiac Enzymes No results for input(s): CKMB, TROPONINI, MYOGLOBIN in the last 168 hours.  Invalid input(s): CK ------------------------------------------------------------------------------------------------------------------    Component Value Date/Time   BNP 35.8 01/29/2020 1915    Micro Results Recent Results (from the past 240 hour(s))  SARS Coronavirus 2 by RT PCR (hospital order, performed in [facility] hospital lab) Nasopharyngeal Nasopharyngeal Swab     Status: Abnormal   Collection Time: 01/29/20  9:56 AM   Specimen: Nasopharyngeal Swab  Result Value Ref Range Status   SARS Coronavirus 2 POSITIVE (A) NEGATIVE Final    Comment: RESULT CALLED TO, READ BACK BY AND VERIFIED WITH: RN LOUIE CHILTON [id number] 01/29/20 KB (NOTE) SARS-CoV-2 target nucleic acids are DETECTED  SARS-CoV-2 RNA is generally detectable in upper respiratory specimens  during the acute phase of infection.  Positive results are indicative  of the presence of the identified virus, but do not rule out bacterial infection or co-infection with other pathogens not detected by the test.  Clinical correlation with patient history and  other diagnostic information is necessary to determine patient infection status.  The expected result is negative.  Fact Sheet for Patients:     StrictlyIdeas.no   Fact Sheet for Healthcare Providers:   BankingDealers.co.za    This test is not yet approved or cleared by the [place] FDA and  has been authorized for detection and/or diagnosis of SARS-CoV-2 by FDA under an Emergency Use Authorization (EUA).  This EUA will remain in effect (meaning this tes t can be used) for the duration of  the COVID-19 declaration under Section 564(b)(1) of the Act, 21 U.S.C. section 360-bbb-3(b)(1), unless the authorization is terminated or revoked sooner.  Performed at [facility], [place] [address] [place], [place] [address]   Culture, Urine     Status: Abnormal   Collection Time: 01/29/20  2:06 PM   Specimen: Urine, Random  Result Value Ref Range Status   Specimen Description URINE, RANDOM  Final   Special Requests NONE  Final   Culture (A)  Final    >=100,000 COLONIES/mL DIPHTHEROIDS(CORYNEBACTERIUM SPECIES) Standardized susceptibility testing for this organism is not available. Performed at [facility], [place] [address], [place], [place] [address]    Report Status 01/30/2020 FINAL  Final  Culture,  blood (Routine X 2) w Reflex to ID Panel     Status: Abnormal   Collection Time: 01/29/20  6:57 PM   Specimen: BLOOD  Result Value Ref Range Status   Specimen Description BLOOD RIGHT ANTECUBITAL  Final   Special Requests   Final    BOTTLES DRAWN AEROBIC AND ANAEROBIC Blood Culture adequate volume   Culture  Setup Time   Final    IN BOTH AEROBIC AND ANAEROBIC BOTTLES GRAM POSITIVE RODS CRITICAL RESULT CALLED TO, READ BACK BY AND VERIFIED WITH: Salome Holmes [facility] 01/30/20 1938 JDW    Culture (A)  Final    CORYNEBACTERIUM STRIATUM ACTINOMYCES NEUII Standardized susceptibility testing for this organism is not available. Performed at [facility], [place] [address], [place], [place] [address]    Report Status 02/02/2020 FINAL  Final  Culture, blood (Routine X 2) w Reflex to  ID Panel     Status: Abnormal   Collection Time: 01/29/20  7:05 PM   Specimen: BLOOD LEFT HAND  Result Value Ref Range Status   Specimen Description BLOOD LEFT HAND  Final   Special Requests   Final    BOTTLES DRAWN AEROBIC AND ANAEROBIC Blood Culture adequate volume   Culture  Setup Time   Final    ANAEROBIC BOTTLE ONLY GRAM POSITIVE RODS CRITICAL VALUE NOTED.  VALUE IS CONSISTENT WITH PREVIOUSLY REPORTED AND CALLED VALUE.    Culture (A)  Final    CORYNEBACTERIUM STRIATUM Standardized susceptibility testing for this organism is not available. Performed at [facility], [place] [address], [place], [place] [address]    Report Status 02/01/2020 FINAL  Final  Culture, blood (Routine X 2) w Reflex to ID Panel     Status: None (Preliminary result)   Collection Time: 02/02/20  6:03 AM   Specimen: BLOOD  Result Value Ref Range Status   Specimen Description BLOOD LEFT ANTECUBITAL  Final   Special Requests   Final    BOTTLES DRAWN AEROBIC AND ANAEROBIC Blood Culture adequate volume   Culture   Final    NO GROWTH 2 DAYS Performed at [facility], [place] [address], [place], [place] [address]    Report Status PENDING  Incomplete  Culture, blood (Routine X 2) w Reflex to ID Panel     Status: None (Preliminary result)   Collection Time: 02/02/20  6:03 AM   Specimen: BLOOD  Result Value Ref Range Status   Specimen Description BLOOD RIGHT ANTECUBITAL  Final   Special Requests   Final    BOTTLES DRAWN AEROBIC AND ANAEROBIC Blood Culture adequate volume   Culture   Final    NO GROWTH 2 DAYS Performed at [facility], [place] [address], [place], [place] [address]    Report Status PENDING  Incomplete    Radiology Reports CT ABDOMEN PELVIS W CONTRAST  Result Date: 01/29/2020 CLINICAL DATA:  Fever, body aches, and sore throat for the past 2 days. Lower abdominal pain. COVID-19 positive. EXAM: CT ABDOMEN AND PELVIS WITH CONTRAST TECHNIQUE: Multidetector CT imaging of the abdomen and  pelvis was performed using the standard protocol following bolus administration of intravenous contrast. CONTRAST:  157mL OMNIPAQUE IOHEXOL 300 MG/ML  SOLN COMPARISON:  CT abdomen pelvis dated October 23, 2019. FINDINGS: Lower chest: Small subpleural ground-glass densities at the lung bases with more focal consolidation versus atelectasis in the left lower lobe. Hepatobiliary: Heterogeneous hepatic parenchyma and ill-defined hypodense liver lesions are less conspicuous when compared to the prior  study due to different phase of contrast. No obvious significant interval change. No new focal liver abnormality. The gallbladder is unremarkable. No biliary dilatation. Pancreas: Unremarkable. No pancreatic ductal dilatation or surrounding inflammatory changes. Spleen: Normal in size without focal abnormality. Adrenals/Urinary Tract: Adrenal glands are unremarkable. Unchanged punctate calculus in the upper pole of the right kidney. No hydronephrosis. The bladder is unremarkable. Stomach/Bowel: Stomach is within normal limits. Appendix appears normal. No evidence of bowel wall thickening, distention, or inflammatory changes. Vascular/Lymphatic: No significant vascular findings are present. No enlarged abdominal or pelvic lymph nodes. Reproductive: Uterus and bilateral adnexa are unremarkable. Other: No abdominal wall hernia or abnormality. No abdominopelvic ascites. No pneumoperitoneum. Musculoskeletal: No acute or significant osseous findings. IMPRESSION: 1. No acute intra-abdominal process. 2. Small subpleural ground-glass densities at the lung bases with more focal consolidation versus atelectasis in the left lower lobe. Findings are suspicious for COVID-19 pneumonia given clinical history. 3. Heterogeneous hepatic parenchyma and ill-defined hypodense liver lesions are less conspicuous when compared to the prior study due to different phase of contrast, but there is no obvious significant interval change. 4. Unchanged  nonobstructive right nephrolithiasis. Electronically Signed   By: Titus Dubin M.D.   On: 01/29/2020 14:06   DG CHEST PORT 1 VIEW  Result Date: 02/01/2020 CLINICAL DATA:  COVID-19 positive patient.  Shortness of breath. EXAM: PORTABLE CHEST 1 VIEW COMPARISON:  Single-view of the chest 01/29/2020 and 10/23/2019. FINDINGS: There is new patchy bilateral airspace disease best seen in the upper lung zones and periphery of the right lung base. Cardiomegaly and vascular congestion again noted. IMPRESSION: New patchy bilateral airspace disease most consistent with pneumonia. Chronic cardiomegaly and vascular congestion. Electronically Signed   By: Inge Rise M.D.   On: 02/01/2020 13:50   DG Chest Portable 1 View  Result Date: 01/29/2020 CLINICAL DATA:  Dyspnea, fever, nausea and vomiting. EXAM: PORTABLE CHEST 1 VIEW COMPARISON:  Single-view of the chest 06/24/2020 and 08/13/2019. CT chest 10/05/2019. FINDINGS: There is cardiomegaly and pulmonary vascular congestion. No consolidative process, pneumothorax or effusion is identified. No acute or focal bony abnormality. IMPRESSION: Cardiomegaly and pulmonary vascular congestion. Electronically Signed   By: Inge Rise M.D.   On: 01/29/2020 09:13   ECHOCARDIOGRAM LIMITED  Result Date: 02/02/2020    ECHOCARDIOGRAM LIMITED REPORT   Patient Name:   MAKALIA BARE Date of Exam: 02/02/2020 Medical Rec #:  [id number]     Height:       68.0 in Accession #:    [id number]    Weight:       373.5 lb Date of Birth:  01-Jun-1971     BSA:          2.665 m Patient Age:    25 years      BP:           142/77 mmHg Patient Gender: F             HR:           74 bpm. Exam Location:  [facility] Procedure: Limited Echo, Cardiac Doppler and Color Doppler Indications:    Bacteremia 790.7/R78.81  History:        Patient has prior history of Echocardiogram examinations, most                 recent 05/11/2018. Risk Factors:Morbid Obesity. CV19 positive.  Sonographer:    [name]  RDCS Referring Phys: [place]  Sonographer Comments: This was a limited echo to  rule out endocarditis in a patient positive for CV19. IMPRESSIONS  1. Left ventricular ejection fraction, by estimation, is 40 to 45%. The left ventricle has mildly decreased function. The left ventricle demonstrates global hypokinesis. Left ventricular diastolic function could not be evaluated.  2. Right ventricular systolic function is normal. The right ventricular size is mildly enlarged. Tricuspid regurgitation signal is inadequate for assessing PA pressure.  3. The mitral valve is normal in structure. Trivial mitral valve regurgitation.  4. The aortic valve is normal in structure. Aortic valve regurgitation is not visualized. No aortic stenosis is present. Conclusion(s)/Recommendation(s): No evidence of valvular vegetations on this transthoracic echocardiogram. Would recommend a transesophageal echocardiogram to exclude infective endocarditis if clinically indicated. FINDINGS  Left Ventricle: Left ventricular ejection fraction, by estimation, is 40 to 45%. The left ventricle has mildly decreased function. The left ventricle demonstrates global hypokinesis. The left ventricular internal cavity size was normal in size. There is  no left ventricular hypertrophy. Right Ventricle: The right ventricular size is mildly enlarged. No increase in right ventricular wall thickness. Right ventricular systolic function is normal. Tricuspid regurgitation signal is inadequate for assessing PA pressure. Left Atrium: Left atrial size was normal in size. Right Atrium: Right atrial size was normal in size. Pericardium: There is no evidence of pericardial effusion. Mitral Valve: The mitral valve is normal in structure. There is mild thickening of the mitral valve leaflet(s). Trivial mitral valve regurgitation. Tricuspid Valve: The tricuspid valve is normal in structure. Tricuspid valve regurgitation is trivial. There is no evidence of  tricuspid valve vegetation. Aortic Valve: The aortic valve is normal in structure. Aortic valve regurgitation is not visualized. No aortic stenosis is present. Pulmonic Valve: The pulmonic valve was not well visualized. Pulmonic valve regurgitation is trivial. Aorta: The aortic root is normal in size and structure. Venous: The inferior vena cava was not well visualized. IAS/Shunts: The interatrial septum was not well visualized.  LEFT VENTRICLE PLAX 2D LVIDd:         6.00 cm LVIDs:         4.50 cm LV PW:         1.00 cm LV IVS:        0.90 cm LVOT diam:     2.10 cm LVOT Area:     3.46 cm  LEFT ATRIUM         Index LA diam:    3.50 cm 1.31 cm/m   AORTA Ao Root diam: 3.10 cm Ao Asc diam:  3.50 cm  SHUNTS Systemic Diam: 2.10 cm Adrian Prows MD Electronically signed by Adrian Prows MD Signature Date/Time: 02/02/2020/1:01:46 PM    Final

## 2020-02-05 NOTE — Progress Notes (Signed)
Attempted to insert IV x2, but was unsuccessful.  IV team consult placed.

## 2020-02-05 NOTE — Plan of Care (Signed)
  Problem: Clinical Measurements: Goal: Respiratory complications will improve Outcome: Progressing   

## 2020-02-05 NOTE — Progress Notes (Signed)
Physical Therapy Treatment Patient Details Name: [name] MRN: [id number] DOB: 05-19-1971 Today's Date: 02/05/2020    History of Present Illness [name] is a 49 y.o. female with medical history significant of systolic CHF, lupus, diabetes mellitus type 2 with gastroparesis, and morbid obesity presents with complaints of shortness of breath and nausea over the last 5 days. Upon admission into the hospital patient was noted to be febrile up to 103 F, heart rates elevated up to 118, respiration 15-24, blood pressures 137/91-188/88, and O2 saturations currently maintained on on room air.  With ambulation patient's O2 saturations dropped to 89%; Covid screening was positive    PT Comments    Patient moves very slowly and requires frequent seated rest breaks (due to reported fatigue--vital signs stable throughout while on room air). When educated on goal to be OOB during day and in bed at night, asked pt is she typically resting in bed during the day at home and she denied. Asked if she normally is up and moving around more when she is at home and she reported yes. RN later reported she told Dr. Sloan Leiter that she is not very active at home.     Follow Up Recommendations  Home health PT     Equipment Recommendations  Rolling walker with 5" wheels (Bariatric)    Recommendations for Other Services       Precautions / Restrictions Precautions Precautions: Fall Precaution Comments: monitor O2 sats Restrictions Weight Bearing Restrictions: No    Mobility  Bed Mobility Overal bed mobility: Modified Independent Bed Mobility: Supine to Sit     Supine to sit: HOB elevated;Modified independent (Device/Increase time)     General bed mobility comments: including manipulating linens  Transfers Overall transfer level: Needs assistance Equipment used: Straight cane Transfers: Sit to/from Stand Sit to Stand: Supervision         General transfer comment: increased time/effort; x 4  reps throughout session  Ambulation/Gait Ambulation/Gait assistance: Min guard Gait Distance (Feet): 20 Feet (25, 20, 20) Assistive device: Straight cane Gait Pattern/deviations: Decreased step length - right;Decreased step length - left;Step-to pattern;Wide base of support Gait velocity: very slow; encouraged wide BOS to improve balance   General Gait Details: Slow, guarded steps; multiple seated rest breaks due to fatigue (not dyspnea; sats/HR stable)   Stairs             Wheelchair Mobility    Modified Rankin (Stroke Patients Only)       Balance Overall balance assessment: Needs assistance Sitting-balance support: Feet supported Sitting balance-Leahy Scale: Good     Standing balance support: Single extremity supported;During functional activity Standing balance-Leahy Scale: Fair Standing balance comment: typically dependent on at least single UE support                             Cognition Arousal/Alertness: Awake/alert Behavior During Therapy: Flat affect Overall Cognitive Status: Within Functional Limits for tasks assessed                                        Exercises      General Comments General comments (skin integrity, edema, etc.): On room air on arrival with sats 98%; completed entire session on room air with sats 95-98%. HR 72-100 bpm. BP increased with activity      Pertinent Vitals/Pain Pain Assessment: No/denies  pain    Home Living                      Prior Function            PT Goals (current goals can now be found in the care plan section) Acute Rehab PT Goals Patient Stated Goal: regain strength/independence  Time For Goal Achievement: 02/16/20 Potential to Achieve Goals: Good Progress towards PT goals: Progressing toward goals    Frequency    Min 3X/week      PT Plan Current plan remains appropriate    Co-evaluation              AM-PAC PT "6 Clicks" Mobility   Outcome  Measure  Help needed turning from your back to your side while in a flat bed without using bedrails?: None Help needed moving from lying on your back to sitting on the side of a flat bed without using bedrails?: A Little Help needed moving to and from a bed to a chair (including a wheelchair)?: None Help needed standing up from a chair using your arms (e.g., wheelchair or bedside chair)?: None Help needed to walk in hospital room?: None Help needed climbing 3-5 steps with a railing? : A Little 6 Click Score: 22    End of Session   Activity Tolerance: Patient limited by fatigue Patient left: with call bell/phone within reach;in chair;with chair alarm set Nurse Communication: Mobility status PT Visit Diagnosis: Unsteadiness on feet (R26.81);Other abnormalities of gait and mobility (R26.89) (Decr functional capacity)     Time: 3212-2482 PT Time Calculation (min) (ACUTE ONLY): 41 min  Charges:  $Gait Training: 38-52 mins                      Arby Barrette, PT Pager [phone]    Rexanne Mano 02/05/2020, 1:08 PM

## 2020-02-06 LAB — BASIC METABOLIC PANEL
Anion gap: 8 (ref 5–15)
BUN: 17 mg/dL (ref 6–20)
CO2: 23 mmol/L (ref 22–32)
Calcium: 8.6 mg/dL — ABNORMAL LOW (ref 8.9–10.3)
Chloride: 107 mmol/L (ref 98–111)
Creatinine, Ser: 1.11 mg/dL — ABNORMAL HIGH (ref 0.44–1.00)
GFR calc Af Amer: >60 mL/min (ref >60)
GFR calc non Af Amer: 58 mL/min — ABNORMAL LOW (ref >60)
Glucose, Bld: 228 mg/dL — ABNORMAL HIGH (ref 70–99)
Potassium: 3.7 mmol/L (ref 3.5–5.1)
Sodium: 138 mmol/L (ref 135–145)

## 2020-02-06 LAB — GLUCOSE, CAPILLARY
Glucose-Capillary: 155 mg/dL — ABNORMAL HIGH (ref 70–99)
Glucose-Capillary: 208 mg/dL — ABNORMAL HIGH (ref 70–99)
Glucose-Capillary: 338 mg/dL — ABNORMAL HIGH (ref 70–99)
Glucose-Capillary: 299 mg/dL — ABNORMAL HIGH (ref 70–99)

## 2020-02-06 MED ORDER — TORSEMIDE 20 MG PO TABS
20.0000 mg | ORAL_TABLET | Freq: Two times a day (BID) | ORAL | Status: DC
  Administered 2020-02-06: 20 mg via ORAL
  Filled 2020-02-06 (×2): qty 1

## 2020-02-06 MED ORDER — PREDNISONE 10 MG PO TABS
10.0000 mg | ORAL_TABLET | Freq: Every day | ORAL | Status: DC
  Administered 2020-02-07: 10 mg via ORAL
  Filled 2020-02-06: qty 1

## 2020-02-06 MED ORDER — SPIRONOLACTONE 25 MG PO TABS
50.0000 mg | ORAL_TABLET | Freq: Every day | ORAL | Status: DC
  Administered 2020-02-06 – 2020-02-07 (×2): 50 mg via ORAL
  Filled 2020-02-06 (×2): qty 2

## 2020-02-06 NOTE — Progress Notes (Signed)
SpO2 100% on room air at rest.   While ambulating in hallway, spO2 dropped to 98% on room air.   SpO2 returned to 100% when back at rest.   Pt did not require any supplemental oxygen.

## 2020-02-06 NOTE — TOC Progression Note (Signed)
Transition of Care [facility]) - Progression Note    Patient Details  Name: [name] MRN: [id number] Date of Birth: October 18, 1970  [facility]) CM/SW [place], RN Phone Number: 02/06/2020, 12:16 PM  Clinical Narrative:    Will go home with PT OT , requesting Aide in AM [facility] w/ [place].  Qualifications for O2 to be done will follow up with nursing to assess  oxygen need.    Expected Discharge Plan: [place] Barriers to Discharge: Continued Medical Work up  Expected Discharge Plan and Services Expected Discharge Plan: [place]: [place] arrangements for the past 2 months: Single Family Home                           HH Arranged: PT, OT [facility] Agency: [place] Date [facility] Agency Contacted: 02/04/20 Time [place]: 1532 Representative spoke with at [place]: [place] ([place]) Interventions    Readmission Risk Interventions Readmission Risk Prevention Plan 02/05/2020  Transportation Screening Complete  Medication Review Press photographer) Referral to Pharmacy  [place] or [place] Complete  SW Recovery Care/Counseling Consult Complete  [place] Not Applicable  Some recent data might be hidden

## 2020-02-06 NOTE — Progress Notes (Signed)
RN placed patient on CPAP.  RT will continue to monitor.

## 2020-02-06 NOTE — Progress Notes (Addendum)
PROGRESS NOTE                                                                                                                                                                                                             Patient Demographics:    [name], is a 49 y.o. female, DOB - Jul 14, 1971, VXB:[id number]  Outpatient Primary MD for the patient is Bartholome Bill, MD   Admit date - 01/28/2020   LOS - 8  Chief Complaint  Patient presents with  . COVID like symptoms       Brief Narrative: Patient is a 49 y.o. female with PMHx of lupus, chronic systolic heart failure, DM-2, morbid obesity-hold presented with shortness of breath-found to have fever and acute hypoxic respiratory failure secondary to COVID-19 pneumonia along with Corynebacterium bacteremia.  Significant Events: 6/29>> admit to [facility] for hypoxia from COVID-19.  Significant studies: 6/30>> CT abdomen/pelvis: No acute intra-abdominal process 7/3>> chest x-ray: New patchy bilateral airspace disease consistent with pneumonia. 7/4>> echo: EF 40-45%, LV demonstrates global hypokinesis.  COVID-19 medications: Steroids: 6/30>> Remdesivir: 6/30>> 7/4  Antibiotics: Zithromax: 6/30>> 7/1 Rocephin: 6/30>> 7/1 Unasyn: 7/2>> 7/20 Vancomycin: 7/1>>  Microbiology data: 6/30: Urine culture>> Courtney bacterium 6/30: Blood culture>> Corynebacterium, actinomyces 7/4: Blood culture>> no growth  Procedures: None  Consults: None  DVT prophylaxis: Lovenox    Subjective:   She feels weak-continues to complain of some exertional dyspnea with ambulation.  Somewhat better after IV Lasix yesterday.   Assessment  & Plan :   Sepsis with acute Hypoxic Resp Failure due to Covid 19 Viral pneumonia: Sepsis physiology has resolved-hypoxia has improved-she is on room air with ambulation-but may requireO2 with ambulation.  Stop Decadron-transition to prednisone-we  will start at 10 mg daily-and transition to her usual dose of 5 mg in the next day or so.  She is overall improving-volume status better-resume usual home dosing of diuretics today-she is s/p IV Lasix yesterday.  If she continues to improve-suspect she could be discharged tomorrow with maximal home health services.  We will ask nursing staff to assess if she requires home O2.  Fever: afebrile Prone/Incentive Spirometry: encourage incentive spirometry use 3-4/hour. O2 requirements:  SpO2: 99 % O2 Flow Rate (L/min): 1 L/min   COVID-19 Labs: No results for input(s): DDIMER, FERRITIN, LDH, CRP in the last 72 hours.  Component Value Date/Time   BNP 35.8 01/29/2020 1915    Recent Labs  Lab 01/31/20 0822  PROCALCITON <0.10    Lab Results  Component Value Date   SARSCOV2NAA POSITIVE (A) 01/29/2020   [place] NEGATIVE 10/23/2019   [place] NEGATIVE 08/24/2019   [place] NEGATIVE 08/12/2019     Corynebacterium/actinomyces bacteremia with UTI: Prior MD-Dr. Riki Altes discussed with ID-recommendations were for total of 14 days of antibiotics.  Continue IV vancomycin while hospitalized-with plans to transition to Zyvox upon discharge.  Per prior notes-echo finding was discussed with Dr. Gwenlyn Perking evidence of vegetation.  Chronic systolic heart failure: Although no overt signs of volume overload-given exertional dyspnea-she was given 1 dose of IV Lasix-she has trace lower extremity edema-we will resume her usual diuretic regimen.  Continue Coreg and Entresto as previous.    AKI: Likely hemodynamically mediated-resolved.  Insulin-dependent DM-2 (A1c 10.5 on 7/1) with uncontrolled hyperglycemia due to steroids: CBGs relatively well controlled-since steroid being tapered down-continue to monitor for 1 additional day before adjusting further.  Remains on 55 units of Lantus twice daily, 8 units of NovoLog with meals-and SSI.  Recent Labs    02/05/20 2039 02/06/20 0726  02/06/20 1202  GLUCAP 316* 155* 208*   Hypothyroidism: Continue Synthroid  HLD: Continue statin  GERD: Continue PPI/Carafate  Lupus: Stable-continue azathioprine, hydroxychloroquine-already on steroids for COVID-19.  Resume usual dosing of steroids on discharge.  OSA: Resume CPAP nightly  Morbid Obesity: Estimated body mass index is 53.63 kg/m as calculated from the following:   Height as of this encounter: 5\' 8"  (1.727 m).   Weight as of this encounter: 160 kg.   RN pressure injury documentation: Pressure Injury 08/29/19 Buttocks Left Stage 2 -  Partial thickness loss of dermis presenting as a shallow open injury with a red, pink wound bed without slough. pink, open area (Active)  08/29/19 2157  Location: Buttocks  Location Orientation: Left  Staging: Stage 2 -  Partial thickness loss of dermis presenting as a shallow open injury with a red, pink wound bed without slough.  Wound Description (Comments): pink, open area  Present on Admission: Yes    GI prophylaxis: PPI  ABG:    Component Value Date/Time   PHART 7.285 (L) 08/14/2019 0517   PCO2ART 38.9 08/14/2019 0517   PO2ART 88.3 08/14/2019 0517   HCO3 18.0 (L) 08/14/2019 0517   TCO2 17 (L) 08/12/2019 1523   ACIDBASEDEF 7.5 (H) 08/14/2019 0517   O2SAT 96.0 08/14/2019 0517    Vent Settings: N/A  Condition - Stable  Family Communication  : We will update family over the next few days.  Code Status :  Full Code  Diet :  Diet Order            Diet heart healthy/carb modified Room service appropriate? Yes; Fluid consistency: Thin  Diet effective now                  Disposition Plan  : Status is: Inpatient  Remains inpatient appropriate because:Inpatient level of care appropriate due to severity of illness  Dispo: The patient is from: Home              Anticipated d/c is to: Home              Anticipated d/c date is: 2 days              Patient currently is not medically stable to d/c.   Barriers to  discharge: COVID-19 pneumonia-improving hypoxemia-need to  assess home O2 requirement-arrange home health services-likely discharge on 7/9.  Antimicorbials  :    Anti-infectives (From admission, onward)   Start     Dose/Rate Route Frequency Ordered Stop   01/31/20 1400  Ampicillin-Sulbactam (UNASYN) 3 g in sodium chloride 0.9 % 100 mL IVPB  Status:  Discontinued        3 g 200 mL/hr over 30 Minutes Intravenous Every 6 hours 01/31/20 1315 02/01/20 1316   01/31/20 0900  vancomycin (VANCOREADY) IVPB 1250 mg/250 mL     Discontinue     1,250 mg 166.7 mL/hr over 90 Minutes Intravenous Every 12 hours 01/30/20 2024     01/30/20 2100  vancomycin (VANCOCIN) 2,500 mg in sodium chloride 0.9 % 500 mL IVPB        2,500 mg 250 mL/hr over 120 Minutes Intravenous  Once 01/30/20 2024 01/31/20 0235   01/30/20 1000  remdesivir 100 mg in sodium chloride 0.9 % 100 mL IVPB       "Followed by" Linked Group Details   100 mg 200 mL/hr over 30 Minutes Intravenous Daily 01/29/20 1546 02/02/20 1209   01/29/20 2200  hydroxychloroquine (PLAQUENIL) tablet 200 mg     Discontinue     200 mg Oral 2 times daily 01/29/20 1546     01/29/20 2000  cefTRIAXone (ROCEPHIN) 2 g in sodium chloride 0.9 % 100 mL IVPB  Status:  Discontinued        2 g 200 mL/hr over 30 Minutes Intravenous Every 24 hours 01/29/20 1850 01/31/20 0732   01/29/20 2000  azithromycin (ZITHROMAX) 500 mg in sodium chloride 0.9 % 250 mL IVPB  Status:  Discontinued        500 mg 250 mL/hr over 60 Minutes Intravenous Every 24 hours 01/29/20 1850 01/31/20 0732   01/29/20 1545  remdesivir 200 mg in sodium chloride 0.9% 250 mL IVPB       "Followed by" Linked Group Details   200 mg 580 mL/hr over 30 Minutes Intravenous Once 01/29/20 1546 01/29/20 1856      Inpatient Medications  Scheduled Meds: . albuterol  2 puff Inhalation Q6H  . vitamin C  500 mg Oral Daily  . aspirin EC  81 mg Oral Daily  . azaTHIOprine  150 mg Oral BID  . buPROPion  300 mg Oral Daily    . carvedilol  25 mg Oral BID WC  . cetaphil  1 application Topical BID  . cycloSPORINE  1 drop Both Eyes BID  . dexamethasone  4 mg Oral Daily  . enoxaparin (LOVENOX) injection  80 mg Subcutaneous Q24H  . famotidine  20 mg Oral BID  . feeding supplement (GLUCERNA SHAKE)  237 mL Oral TID BM  . hydroxychloroquine  200 mg Oral BID  . insulin aspart  0-15 Units Subcutaneous TID WC  . insulin aspart  0-5 Units Subcutaneous QHS  . insulin aspart  8 Units Subcutaneous TID WC  . insulin glargine  55 Units Subcutaneous BID  . levothyroxine  50 mcg Oral Q0600  . loratadine  10 mg Oral Daily  . mometasone-formoterol  2 puff Inhalation BID  . pantoprazole  40 mg Oral Daily  . pravastatin  10 mg Oral Daily  . pregabalin  75 mg Oral TID  . sacubitril-valsartan  1 tablet Oral BID  . sodium chloride flush  3 mL Intravenous Q12H  . sucralfate  1 g Oral BID  . topiramate  75 mg Oral QHS  . zinc sulfate  220 mg Oral  Daily   Continuous Infusions: . sodium chloride Stopped (01/29/20 2205)  . vancomycin 1,250 mg (02/06/20 0957)   PRN Meds:.sodium chloride, acetaminophen, ALPRAZolam, chlorpheniramine-HYDROcodone, diclofenac Sodium, doxepin, fluticasone, guaiFENesin-dextromethorphan, loperamide, ondansetron **OR** ondansetron (ZOFRAN) IV, oxyCODONE, phenol, promethazine, traMADol   Time Spent in minutes  25   See all Orders from today for further details   Oren Binet M.D on 02/06/2020 at 12:07 PM  To page go to [email or web address] - use universal password  [facility] -  Office  [phone]    Objective:   Vitals:   02/05/20 2040 02/06/20 0500 02/06/20 0505 02/06/20 0820  BP: (!) 141/81  (!) 156/90 (!) 145/98  Pulse: 74  67   Resp: 19  15 14   Temp: 98.7 F (37.1 C)  98 F (36.7 C) 97.9 F (36.6 C)  TempSrc: Oral  Axillary Axillary  SpO2: 100%  100% 99%  Weight:  (!) 160 kg    Height:        Wt Readings from Last 3 Encounters:  02/06/20 (!) 160 kg  01/21/20 (!) 155.1 kg   10/23/19 (!) 160 kg     Intake/Output Summary (Last 24 hours) at 02/06/2020 1207 Last data filed at 02/06/2020 0532 Gross per 24 hour  Intake 790 ml  Output 2200 ml  Net -1410 ml     Physical Exam Gen Exam:Alert awake-not in any distress HEENT:atraumatic, normocephalic Chest: B/L clear to auscultation anteriorly CVS:S1S2 regular Abdomen:soft non tender, non distended Extremities:trace edema Neurology: Non focal Skin: no rash   Data Review:    CBC Recent Labs  Lab 01/31/20 0325 01/31/20 0325 02/01/20 1337 02/02/20 0607 02/03/20 0206 02/04/20 0510 02/05/20 0239  WBC 2.8*   < > 8.0 6.1 5.3 6.6 8.4  HGB 10.7*   < > 11.4* 11.2* 10.7* 10.7* 10.5*  HCT 33.6*   < > 37.4 36.6 35.1* 34.1* 35.1*  PLT 201   < > 225 224 262 243 255  MCV 88.4   < > 90.1 90.1 90.7 91.2 91.6  MCH 28.2   < > 27.5 27.6 27.6 28.6 27.4  MCHC 31.8   < > 30.5 30.6 30.5 31.4 29.9*  RDW 13.2   < > 13.5 13.4 13.2 13.0 12.9  LYMPHSABS 0.9  --  0.7 1.6 1.2  --   --   MONOABS 0.3  --  0.5 0.7 0.5  --   --   EOSABS 0.0  --  0.0 0.0 0.0  --   --   BASOSABS 0.0  --  0.0 0.0 0.0  --   --    < > = values in this interval not displayed.    Chemistries  Recent Labs  Lab 01/31/20 0325 01/31/20 0325 02/01/20 1337 02/01/20 1337 02/02/20 0607 02/03/20 0206 02/04/20 0510 02/05/20 0239 02/06/20 0340  NA 136   < > 137   < > 141 137 137 138 138  K 4.7   < > 4.4   < > 4.0 4.1 3.9 4.0 3.7  CL 108   < > 109   < > 110 109 107 108 107  CO2 19*   < > 19*   < > 22 20* 21* 22 23  GLUCOSE 374*   < > 415*   < > 235* 262* 185* 208* 228*  BUN 25*   < > 18   < > 16 17 17 18 17   CREATININE 1.68*   < > 1.30*   < > 1.06* 1.08* 0.93 0.99 1.11*  CALCIUM 8.3*   < > 8.6*   < > 8.8* 8.5* 8.5* 8.6* 8.6*  MG 1.9  --  1.9  --  1.8 1.8  --   --   --   AST 27  --  22  --  16 13*  --   --   --   ALT 15  --  13  --  12 10  --   --   --   ALKPHOS 70  --  79  --  81 75  --   --   --   BILITOT 0.5  --  0.6  --  0.5 0.6  --   --   --     < > = values in this interval not displayed.   ------------------------------------------------------------------------------------------------------------------ No results for input(s): CHOL, HDL, LDLCALC, TRIG, CHOLHDL, LDLDIRECT in the last 72 hours.  Lab Results  Component Value Date   HGBA1C 10.5 (H) 01/30/2020   ------------------------------------------------------------------------------------------------------------------ No results for input(s): TSH, T4TOTAL, T3FREE, THYROIDAB in the last 72 hours.  Invalid input(s): FREET3 ------------------------------------------------------------------------------------------------------------------ No results for input(s): VITAMINB12, FOLATE, FERRITIN, TIBC, IRON, RETICCTPCT in the last 72 hours.  Coagulation profile No results for input(s): INR, PROTIME in the last 168 hours.  No results for input(s): DDIMER in the last 72 hours.  Cardiac Enzymes No results for input(s): CKMB, TROPONINI, MYOGLOBIN in the last 168 hours.  Invalid input(s): CK ------------------------------------------------------------------------------------------------------------------    Component Value Date/Time   BNP 35.8 01/29/2020 1915    Micro Results Recent Results (from the past 240 hour(s))  SARS Coronavirus 2 by RT PCR (hospital order, performed in [facility] hospital lab) Nasopharyngeal Nasopharyngeal Swab     Status: Abnormal   Collection Time: 01/29/20  9:56 AM   Specimen: Nasopharyngeal Swab  Result Value Ref Range Status   SARS Coronavirus 2 POSITIVE (A) NEGATIVE Final    Comment: RESULT CALLED TO, READ BACK BY AND VERIFIED WITH: RN LOUIE CHILTON [phone] 01/29/20 KB (NOTE) SARS-CoV-2 target nucleic acids are DETECTED  SARS-CoV-2 RNA is generally detectable in upper respiratory specimens  during the acute phase of infection.  Positive results are indicative  of the presence of the identified virus, but do not rule out bacterial infection  or co-infection with other pathogens not detected by the test.  Clinical correlation with patient history and  other diagnostic information is necessary to determine patient infection status.  The expected result is negative.  Fact Sheet for Patients:   StrictlyIdeas.no   Fact Sheet for Healthcare Providers:   BankingDealers.co.za    This test is not yet approved or cleared by the [place] FDA and  has been authorized for detection and/or diagnosis of SARS-CoV-2 by FDA under an Emergency Use Authorization (EUA).  This EUA will remain in effect (meaning this tes t can be used) for the duration of  the COVID-19 declaration under Section 564(b)(1) of the Act, 21 U.S.C. section 360-bbb-3(b)(1), unless the authorization is terminated or revoked sooner.  Performed at [facility], [place] [address] [place], [place] [address]   Culture, Urine     Status: Abnormal   Collection Time: 01/29/20  2:06 PM   Specimen: Urine, Random  Result Value Ref Range Status   Specimen Description URINE, RANDOM  Final   Special Requests NONE  Final   Culture (A)  Final    >=100,000 COLONIES/mL DIPHTHEROIDS(CORYNEBACTERIUM SPECIES) Standardized susceptibility testing for this organism is not available. Performed at [facility], [place]  [address], [place], [place] [address]    Report Status 01/30/2020 FINAL  Final  Culture, blood (Routine X 2) w Reflex to ID Panel     Status: Abnormal   Collection Time: 01/29/20  6:57 PM   Specimen: BLOOD  Result Value Ref Range Status   Specimen Description BLOOD RIGHT ANTECUBITAL  Final   Special Requests   Final    BOTTLES DRAWN AEROBIC AND ANAEROBIC Blood Culture adequate volume   Culture  Setup Time   Final    IN BOTH AEROBIC AND ANAEROBIC BOTTLES GRAM POSITIVE RODS CRITICAL RESULT CALLED TO, READ BACK BY AND VERIFIED WITH: Salome Holmes [facility] 01/30/20 1938 JDW    Culture (A)  Final    CORYNEBACTERIUM  STRIATUM ACTINOMYCES NEUII Standardized susceptibility testing for this organism is not available. Performed at [facility], [place] [address], [place], [place] [address]    Report Status 02/02/2020 FINAL  Final  Culture, blood (Routine X 2) w Reflex to ID Panel     Status: Abnormal   Collection Time: 01/29/20  7:05 PM   Specimen: BLOOD LEFT HAND  Result Value Ref Range Status   Specimen Description BLOOD LEFT HAND  Final   Special Requests   Final    BOTTLES DRAWN AEROBIC AND ANAEROBIC Blood Culture adequate volume   Culture  Setup Time   Final    ANAEROBIC BOTTLE ONLY GRAM POSITIVE RODS CRITICAL VALUE NOTED.  VALUE IS CONSISTENT WITH PREVIOUSLY REPORTED AND CALLED VALUE.    Culture (A)  Final    CORYNEBACTERIUM STRIATUM Standardized susceptibility testing for this organism is not available. Performed at [facility], [place] [address] [place], [place] [address]    Report Status 02/01/2020 FINAL  Final  Culture, blood (Routine X 2) w Reflex to ID Panel     Status: None (Preliminary result)   Collection Time: 02/02/20  6:03 AM   Specimen: BLOOD  Result Value Ref Range Status   Specimen Description BLOOD LEFT ANTECUBITAL  Final   Special Requests   Final    BOTTLES DRAWN AEROBIC AND ANAEROBIC Blood Culture adequate volume   Culture   Final    NO GROWTH 4 DAYS Performed at [facility], [place] [address], [place], [place] [address]    Report Status PENDING  Incomplete  Culture, blood (Routine X 2) w Reflex to ID Panel     Status: None (Preliminary result)   Collection Time: 02/02/20  6:03 AM   Specimen: BLOOD  Result Value Ref Range Status   Specimen Description BLOOD RIGHT ANTECUBITAL  Final   Special Requests   Final    BOTTLES DRAWN AEROBIC AND ANAEROBIC Blood Culture adequate volume   Culture   Final    NO GROWTH 4 DAYS Performed at [facility], [place] [address], [place], [place] [address]    Report Status PENDING  Incomplete    Radiology Reports CT  ABDOMEN PELVIS W CONTRAST  Result Date: 01/29/2020 CLINICAL DATA:  Fever, body aches, and sore throat for the past 2 days. Lower abdominal pain. COVID-19 positive. EXAM: CT ABDOMEN AND PELVIS WITH CONTRAST TECHNIQUE: Multidetector CT imaging of the abdomen and pelvis was performed using the standard protocol following bolus administration of intravenous contrast. CONTRAST:  136mL OMNIPAQUE IOHEXOL 300 MG/ML  SOLN COMPARISON:  CT abdomen pelvis dated October 23, 2019. FINDINGS: Lower chest: Small subpleural ground-glass densities at the lung bases with more focal consolidation versus atelectasis in the left lower lobe. Hepatobiliary:  Heterogeneous hepatic parenchyma and ill-defined hypodense liver lesions are less conspicuous when compared to the prior study due to different phase of contrast. No obvious significant interval change. No new focal liver abnormality. The gallbladder is unremarkable. No biliary dilatation. Pancreas: Unremarkable. No pancreatic ductal dilatation or surrounding inflammatory changes. Spleen: Normal in size without focal abnormality. Adrenals/Urinary Tract: Adrenal glands are unremarkable. Unchanged punctate calculus in the upper pole of the right kidney. No hydronephrosis. The bladder is unremarkable. Stomach/Bowel: Stomach is within normal limits. Appendix appears normal. No evidence of bowel wall thickening, distention, or inflammatory changes. Vascular/Lymphatic: No significant vascular findings are present. No enlarged abdominal or pelvic lymph nodes. Reproductive: Uterus and bilateral adnexa are unremarkable. Other: No abdominal wall hernia or abnormality. No abdominopelvic ascites. No pneumoperitoneum. Musculoskeletal: No acute or significant osseous findings. IMPRESSION: 1. No acute intra-abdominal process. 2. Small subpleural ground-glass densities at the lung bases with more focal consolidation versus atelectasis in the left lower lobe. Findings are suspicious for COVID-19  pneumonia given clinical history. 3. Heterogeneous hepatic parenchyma and ill-defined hypodense liver lesions are less conspicuous when compared to the prior study due to different phase of contrast, but there is no obvious significant interval change. 4. Unchanged nonobstructive right nephrolithiasis. Electronically Signed   By: Titus Dubin M.D.   On: 01/29/2020 14:06   DG CHEST PORT 1 VIEW  Result Date: 02/01/2020 CLINICAL DATA:  COVID-19 positive patient.  Shortness of breath. EXAM: PORTABLE CHEST 1 VIEW COMPARISON:  Single-view of the chest 01/29/2020 and 10/23/2019. FINDINGS: There is new patchy bilateral airspace disease best seen in the upper lung zones and periphery of the right lung base. Cardiomegaly and vascular congestion again noted. IMPRESSION: New patchy bilateral airspace disease most consistent with pneumonia. Chronic cardiomegaly and vascular congestion. Electronically Signed   By: Inge Rise M.D.   On: 02/01/2020 13:50   DG Chest Portable 1 View  Result Date: 01/29/2020 CLINICAL DATA:  Dyspnea, fever, nausea and vomiting. EXAM: PORTABLE CHEST 1 VIEW COMPARISON:  Single-view of the chest 06/24/2020 and 08/13/2019. CT chest 10/05/2019. FINDINGS: There is cardiomegaly and pulmonary vascular congestion. No consolidative process, pneumothorax or effusion is identified. No acute or focal bony abnormality. IMPRESSION: Cardiomegaly and pulmonary vascular congestion. Electronically Signed   By: Inge Rise M.D.   On: 01/29/2020 09:13   ECHOCARDIOGRAM LIMITED  Result Date: 02/02/2020    ECHOCARDIOGRAM LIMITED REPORT   Patient Name:   KARRA PINK Date of Exam: 02/02/2020 Medical Rec #:  [id number]     Height:       68.0 in Accession #:    [id number]    Weight:       373.5 lb Date of Birth:  06-Apr-1971     BSA:          2.665 m Patient Age:    18 years      BP:           142/77 mmHg Patient Gender: F             HR:           74 bpm. Exam Location:  [facility] Procedure: Limited Echo,  Cardiac Doppler and Color Doppler Indications:    Bacteremia 790.7/R78.81  History:        Patient has prior history of Echocardiogram examinations, most                 recent 05/11/2018. Risk Factors:Morbid Obesity. CV19 positive.  Sonographer:    [name]  RDCS Referring Phys: [place]  Sonographer Comments: This was a limited echo to rule out endocarditis in a patient positive for CV19. IMPRESSIONS  1. Left ventricular ejection fraction, by estimation, is 40 to 45%. The left ventricle has mildly decreased function. The left ventricle demonstrates global hypokinesis. Left ventricular diastolic function could not be evaluated.  2. Right ventricular systolic function is normal. The right ventricular size is mildly enlarged. Tricuspid regurgitation signal is inadequate for assessing PA pressure.  3. The mitral valve is normal in structure. Trivial mitral valve regurgitation.  4. The aortic valve is normal in structure. Aortic valve regurgitation is not visualized. No aortic stenosis is present. Conclusion(s)/Recommendation(s): No evidence of valvular vegetations on this transthoracic echocardiogram. Would recommend a transesophageal echocardiogram to exclude infective endocarditis if clinically indicated. FINDINGS  Left Ventricle: Left ventricular ejection fraction, by estimation, is 40 to 45%. The left ventricle has mildly decreased function. The left ventricle demonstrates global hypokinesis. The left ventricular internal cavity size was normal in size. There is  no left ventricular hypertrophy. Right Ventricle: The right ventricular size is mildly enlarged. No increase in right ventricular wall thickness. Right ventricular systolic function is normal. Tricuspid regurgitation signal is inadequate for assessing PA pressure. Left Atrium: Left atrial size was normal in size. Right Atrium: Right atrial size was normal in size. Pericardium: There is no evidence of pericardial effusion. Mitral Valve:  The mitral valve is normal in structure. There is mild thickening of the mitral valve leaflet(s). Trivial mitral valve regurgitation. Tricuspid Valve: The tricuspid valve is normal in structure. Tricuspid valve regurgitation is trivial. There is no evidence of tricuspid valve vegetation. Aortic Valve: The aortic valve is normal in structure. Aortic valve regurgitation is not visualized. No aortic stenosis is present. Pulmonic Valve: The pulmonic valve was not well visualized. Pulmonic valve regurgitation is trivial. Aorta: The aortic root is normal in size and structure. Venous: The inferior vena cava was not well visualized. IAS/Shunts: The interatrial septum was not well visualized.  LEFT VENTRICLE PLAX 2D LVIDd:         6.00 cm LVIDs:         4.50 cm LV PW:         1.00 cm LV IVS:        0.90 cm LVOT diam:     2.10 cm LVOT Area:     3.46 cm  LEFT ATRIUM         Index LA diam:    3.50 cm 1.31 cm/m   AORTA Ao Root diam: 3.10 cm Ao Asc diam:  3.50 cm  SHUNTS Systemic Diam: 2.10 cm Adrian Prows MD Electronically signed by Adrian Prows MD Signature Date/Time: 02/02/2020/1:01:46 PM    Final

## 2020-02-07 LAB — BASIC METABOLIC PANEL
Anion gap: 9 (ref 5–15)
BUN: 19 mg/dL (ref 6–20)
CO2: 26 mmol/L (ref 22–32)
Calcium: 8.9 mg/dL (ref 8.9–10.3)
Chloride: 105 mmol/L (ref 98–111)
Creatinine, Ser: 1.1 mg/dL — ABNORMAL HIGH (ref 0.44–1.00)
GFR calc Af Amer: >60 mL/min (ref >60)
GFR calc non Af Amer: 59 mL/min — ABNORMAL LOW (ref >60)
Glucose, Bld: 163 mg/dL — ABNORMAL HIGH (ref 70–99)
Potassium: 3.3 mmol/L — ABNORMAL LOW (ref 3.5–5.1)
Sodium: 140 mmol/L (ref 135–145)

## 2020-02-07 LAB — CULTURE, BLOOD (ROUTINE X 2)
Culture: NO GROWTH
Culture: NO GROWTH
Special Requests: ADEQUATE
Special Requests: ADEQUATE

## 2020-02-07 LAB — GLUCOSE, CAPILLARY: Glucose-Capillary: 67 mg/dL — ABNORMAL LOW (ref 70–99)

## 2020-02-07 MED ORDER — LINEZOLID 600 MG PO TABS
600.0000 mg | ORAL_TABLET | Freq: Two times a day (BID) | ORAL | 0 refills | Status: AC
Start: 2020-02-07 — End: 2020-02-12

## 2020-02-07 MED ORDER — CARVEDILOL 25 MG PO TABS: 25.0000 mg | ORAL_TABLET | Freq: Two times a day (BID) | ORAL | 0 refills | Status: DC

## 2020-02-07 MED ORDER — GLUCERNA SHAKE PO LIQD: 237.0000 mL | Freq: Three times a day (TID) | ORAL | 0 refills | Status: AC

## 2020-02-07 MED FILL — CARVEDILOL 25 MG TABLET: 25 | 30 days supply | Qty: 60 | Fill #0

## 2020-02-07 MED FILL — LINEZOLID 600 MG TABS: 600 | 5 days supply | Qty: 10 | Fill #0

## 2020-02-07 NOTE — Plan of Care (Signed)
  Problem: Clinical Measurements: Goal: Ability to maintain clinical measurements within normal limits will improve Outcome: Progressing Goal: Will remain free from infection Outcome: Progressing Goal: Respiratory complications will improve Outcome: Progressing Goal: Cardiovascular complication will be avoided Outcome: Progressing   Problem: Activity: Goal: Risk for activity intolerance will decrease Outcome: Progressing   Problem: Coping: Goal: Level of anxiety will decrease Outcome: Progressing   Problem: Pain Managment: Goal: General experience of comfort will improve Outcome: Progressing   Problem: Safety: Goal: Ability to remain free from injury will improve Outcome: Progressing

## 2020-02-07 NOTE — Discharge Summary (Signed)
PATIENT DETAILS Name: [name] Age: 49 y.o. Sex: female Date of Birth: 1971-05-07 MRN: [id number]. Admitting Physician: Norval Morton, MD LYY:TKPT, Dola Factor, MD  Admit Date: 01/28/2020 Discharge date: 02/07/2020  Recommendations for Outpatient Follow-up:  1. Follow up with PCP in 1-2 weeks 2. Please obtain CMP/CBC in one week 3. Repeat Chest Xray in 4-6 week  Admitted From:  Home  Disposition: [place]: Yes  Equipment/Devices: None  Discharge Condition: Stable  CODE STATUS: FULL CODE  Diet recommendation:  Diet Order            Diet - low sodium heart healthy           Diet heart healthy/carb modified Room service appropriate? Yes; Fluid consistency: Thin  Diet effective now                 Patient is a 49 y.o. female with PMHx of lupus, chronic systolic heart failure, DM-2, morbid obesity-hold presented with shortness of breath-found to have fever and acute hypoxic respiratory failure secondary to COVID-19 pneumonia along with Corynebacterium bacteremia.  Significant Events: 6/29>> admit to [facility] for hypoxia from COVID-19.  Significant studies: 6/30>> CT abdomen/pelvis: No acute intra-abdominal process 7/3>> chest x-ray: New patchy bilateral airspace disease consistent with pneumonia. 7/4>> echo: EF 40-45%, LV demonstrates global hypokinesis.  COVID-19 medications: Steroids: 6/30>> Remdesivir: 6/30>> 7/4  Antibiotics: Zithromax: 6/30>> 7/1 Rocephin: 6/30>> 7/1 Unasyn: 7/2>> 7/20 Vancomycin: 7/1>>7/9 Zyvox:7/9 x 5 days  Microbiology data: 6/30: Urine culture>> [place]ney bacterium 6/30: Blood culture>> Corynebacterium, actinomyces 7/4: Blood culture>> no growth  Procedures: None  Consults: None   Brief Hospital Course: Sepsis with acute Hypoxic Resp Failure due to Covid 19 Viral pneumonia: Sepsis physiology has resolved-hypoxia has resolved-she has not required O2 for the past several days-ambulatory O2  saturation completed yesterday-she does not require oxygen with ambulation as well.  Treated with steroids/remdesivir-steroid doses has been slowly tapered down-on discharge she will be discharged on her usual steroid regimen.    COVID-19 Labs:  No results for input(s): DDIMER, FERRITIN, LDH, CRP in the last 72 hours.  Lab Results  Component Value Date   [place] (A) 01/29/2020   [place] NEGATIVE 10/23/2019   [place] NEGATIVE 08/24/2019   [place] NEGATIVE 08/12/2019     Corynebacterium/actinomyces bacteremia with UTI: Prior MD-Dr. Riki Altes discussed with ID-recommendations were for total of 14 days of antibiotics.    She was managed with intravenous vancomycin while hospitalized-plans are to transition to Zyvox x5 more days on discharge.  Per prior notes-echo finding was discussed with Dr. Gwenlyn Perking evidence of vegetation.  Chronic systolic heart failure: Although no overt signs of volume overload-given exertional dyspnea-she was given several doses of IV Lasix-her lower extremity edema has improved-she has been resumed on her usual dosing of diuretics, Coreg and Entresto.    AKI: Likely hemodynamically mediated-resolved.  Insulin-dependent DM-2 (A1c 10.5 on 7/1) with uncontrolled hyperglycemia due to steroids: CBGs relatively well controlled-since steroid being tapered down-she was maintained on Lantus 55 units twice daily, 8 units of NovoLog with meals and SSI-on discharge she will be transition back to her usual dosing of insulin.  She is to follow-up with her primary care practitioner for further optimization.   Hypothyroidism: Continue Synthroid  HLD: Continue statin  GERD: Continue PPI/Carafate  Lupus: Stable-continue azathioprine, hydroxychloroquine-already on steroids for COVID-19.  Resume usual dosing of steroids on discharge.  OSA:  Continue CPAP nightly  Debility/deconditioning: Appears to be a chronic issue-worsened by  acute illness-she  is not very mobile at baseline-has a home health aide that she was dependent on before this hospitalization.  Evaluated by therapy services-recommendations are for home health-which has been ordered/arranged.  Morbid Obesity: Estimated body mass index is 53.63 kg/m as calculated from the following:   Height as of this encounter: 5\' 8"  (1.727 m).   Weight as of this encounter: 160 kg.   RN pressure injury documentation: Pressure Injury 08/29/19 Buttocks Left Stage 2 -  Partial thickness loss of dermis presenting as a shallow open injury with a red, pink wound bed without slough. pink, open area (Active)  08/29/19 2157  Location: Buttocks  Location Orientation: Left  Staging: Stage 2 -  Partial thickness loss of dermis presenting as a shallow open injury with a red, pink wound bed without slough.  Wound Description (Comments): pink, open area  Present on Admission: Yes    Discharge Diagnoses:  Principal Problem:   Pneumonia due to COVID-19 virus Active Problems:   SLE   HTN (hypertension)   Chronic systolic CHF (congestive heart failure) ([place])   Morbid obesity ([place])   Diabetes ([place])   Bacteremia   Discharge Instructions:    Person Under Monitoring Name: [name]  Location: [place] [address]   Infection Prevention Recommendations for Individuals Confirmed to have, or Being Evaluated for, 2019 Novel Coronavirus (COVID-19) Infection Who Receive Care at Home  Individuals who are confirmed to have, or are being evaluated for, COVID-19 should follow the prevention steps below until a healthcare provider or local or state health department says they can return to normal activities.  Stay home except to get medical care You should restrict activities outside your home, except for getting medical care. Do not go to work, school, or public areas, and do not use public transportation or taxis.  Call ahead before visiting your doctor Before your medical  appointment, call the healthcare provider and tell them that you have, or are being evaluated for, COVID-19 infection. This will help the healthcare provider's office take steps to keep other people from getting infected. Ask your healthcare provider to call the local or state health department.  Monitor your symptoms Seek prompt medical attention if your illness is worsening (e.g., difficulty breathing). Before going to your medical appointment, call the healthcare provider and tell them that you have, or are being evaluated for, COVID-19 infection. Ask your healthcare provider to call the local or state health department.  Wear a facemask You should wear a facemask that covers your nose and mouth when you are in the same room with other people and when you visit a healthcare provider. People who live with or visit you should also wear a facemask while they are in the same room with you.  Separate yourself from other people in your home As much as possible, you should stay in a different room from other people in your home. Also, you should use a separate bathroom, if available.  Avoid sharing household items You should not share dishes, drinking glasses, cups, eating utensils, towels, bedding, or other items with other people in your home. After using these items, you should wash them thoroughly with soap and water.  Cover your coughs and sneezes Cover your mouth and nose with a tissue when you cough or sneeze, or you can cough or sneeze into your sleeve. Throw used tissues in a lined trash can, and immediately wash your hands with soap and water for at least  20 seconds or use an alcohol-based hand rub.  Wash your [facility] your hands often and thoroughly with soap and water for at least 20 seconds. You can use an alcohol-based hand sanitizer if soap and water are not available and if your hands are not visibly dirty. Avoid touching your eyes, nose, and mouth with unwashed  hands.   Prevention Steps for Caregivers and Household Members of Individuals Confirmed to have, or Being Evaluated for, COVID-19 Infection Being Cared for in the Home  If you live with, or provide care at home for, a person confirmed to have, or being evaluated for, COVID-19 infection please follow these guidelines to prevent infection:  Follow healthcare provider's instructions Make sure that you understand and can help the patient follow any healthcare provider instructions for all care.  Provide for the patient's basic needs You should help the patient with basic needs in the home and provide support for getting groceries, prescriptions, and other personal needs.  Monitor the patient's symptoms If they are getting sicker, call his or her medical provider and tell them that the patient has, or is being evaluated for, COVID-19 infection. This will help the healthcare provider's office take steps to keep other people from getting infected. Ask the healthcare provider to call the local or state health department.  Limit the number of people who have contact with the patient  If possible, have only one caregiver for the patient.  Other household members should stay in another home or place of residence. If this is not possible, they should stay  in another room, or be separated from the patient as much as possible. Use a separate bathroom, if available.  Restrict visitors who do not have an essential need to be in the home.  Keep older adults, very young children, and other sick people away from the patient Keep older adults, very young children, and those who have compromised immune systems or chronic health conditions away from the patient. This includes people with chronic heart, lung, or kidney conditions, diabetes, and cancer.  Ensure good ventilation Make sure that shared spaces in the home have good air flow, such as from an air conditioner or an opened window, weather  permitting.  Wash your hands often  Wash your hands often and thoroughly with soap and water for at least 20 seconds. You can use an alcohol based hand sanitizer if soap and water are not available and if your hands are not visibly dirty.  Avoid touching your eyes, nose, and mouth with unwashed hands.  Use disposable paper towels to dry your hands. If not available, use dedicated cloth towels and replace them when they become wet.  Wear a facemask and gloves  Wear a disposable facemask at all times in the room and gloves when you touch or have contact with the patient's blood, body fluids, and/or secretions or excretions, such as sweat, saliva, sputum, nasal mucus, vomit, urine, or feces.  Ensure the mask fits over your nose and mouth tightly, and do not touch it during use.  Throw out disposable facemasks and gloves after using them. Do not reuse.  Wash your hands immediately after removing your facemask and gloves.  If your personal clothing becomes contaminated, carefully remove clothing and launder. Wash your hands after handling contaminated clothing.  Place all used disposable facemasks, gloves, and other waste in a lined container before disposing them with other household waste.  Remove gloves and wash your hands immediately after handling these items.  Do not share dishes, glasses, or other household items with the patient  Avoid sharing household items. You should not share dishes, drinking glasses, cups, eating utensils, towels, bedding, or other items with a patient who is confirmed to have, or being evaluated for, COVID-19 infection.  After the person uses these items, you should wash them thoroughly with soap and water.  Wash laundry thoroughly  Immediately remove and wash clothes or bedding that have blood, body fluids, and/or secretions or excretions, such as sweat, saliva, sputum, nasal mucus, vomit, urine, or feces, on them.  Wear gloves when handling laundry from  the patient.  Read and follow directions on labels of laundry or clothing items and detergent. In general, wash and dry with the warmest temperatures recommended on the label.  Clean all areas the individual has used often  Clean all touchable surfaces, such as counters, tabletops, doorknobs, bathroom fixtures, toilets, phones, keyboards, tablets, and bedside tables, every day. Also, clean any surfaces that may have blood, body fluids, and/or secretions or excretions on them.  Wear gloves when cleaning surfaces the patient has come in contact with.  Use a diluted bleach solution (e.g., dilute bleach with 1 part bleach and 10 parts water) or a household disinfectant with a label that says EPA-registered for coronaviruses. To make a bleach solution at home, add 1 tablespoon of bleach to 1 quart (4 cups) of water. For a larger supply, add  cup of bleach to 1 gallon (16 cups) of water.  Read labels of cleaning products and follow recommendations provided on product labels. Labels contain instructions for safe and effective use of the cleaning product including precautions you should take when applying the product, such as wearing gloves or eye protection and making sure you have good ventilation during use of the product.  Remove gloves and wash hands immediately after cleaning.  Monitor yourself for signs and symptoms of illness Caregivers and household members are considered close contacts, should monitor their health, and will be asked to limit movement outside of the home to the extent possible. Follow the monitoring steps for close contacts listed on the symptom monitoring form.   ? If you have additional questions, contact your local health department or call the epidemiologist on call at [phone] (available 24/7). ? This guidance is subject to change. For the most up-to-date guidance from [facility], please refer to their  website: YouBlogs.pl    Activity:  As tolerated with Full fall precautions use walker/cane & assistance as needed   Discharge Instructions    Diet - low sodium heart healthy   Complete by: As directed    Discharge instructions   Complete by: As directed    Follow with Primary MD  Bartholome Bill, MD in 1-2 weeks  Please get a complete blood count and chemistry panel checked by your Primary MD at your next visit, and again as instructed by your Primary MD.  Get Medicines reviewed and adjusted: Please take all your medications with you for your next visit with your Primary MD  Laboratory/radiological data: Please request your Primary MD to go over all hospital tests and procedure/radiological results at the follow up, please ask your Primary MD to get all Hospital records sent to his/her office.  In some cases, they will be blood work, cultures and biopsy results pending at the time of your discharge. Please request that your primary care M.D. follows up on these results.  Also Note the following: If you experience worsening of your admission  symptoms, develop shortness of breath, life threatening emergency, suicidal or homicidal thoughts you must seek medical attention immediately by calling 911 or calling your MD immediately  if symptoms less severe.  You must read complete instructions/literature along with all the possible adverse reactions/side effects for all the Medicines you take and that have been prescribed to you. Take any new Medicines after you have completely understood and accpet all the possible adverse reactions/side effects.   Do not drive when taking Pain medications or sleeping medications (Benzodaizepines)  Do not take more than prescribed Pain, Sleep and Anxiety Medications. It is not advisable to combine anxiety,sleep and pain medications without talking with your primary care  practitioner  Special Instructions: If you have smoked or chewed Tobacco  in the last 2 yrs please stop smoking, stop any regular Alcohol  and or any Recreational drug use.  Wear Seat belts while driving.  Please note: You were cared for by a hospitalist during your hospital stay. Once you are discharged, your primary care physician will handle any further medical issues. Please note that NO REFILLS for any discharge medications will be authorized once you are discharged, as it is imperative that you return to your primary care physician (or establish a relationship with a primary care physician if you do not have one) for your post hospital discharge needs so that they can reassess your need for medications and monitor your lab values.   1.) 3 weeks of isolation from 01/29/2020   Increase activity slowly   Complete by: As directed      Allergies as of 02/07/2020      Reactions   Metoclopramide Other (See Comments)   Developed restless leg, akathisia type limb movements.    Codeine Itching, Rash, Other (See Comments)   Penicillins Itching   Tolerated Unasyn 02/01/2020   Hydrocodone Rash   Percocet [oxycodone-acetaminophen] Hives, Rash   Tolerates dilaudid      Medication List    STOP taking these medications   amLODipine 5 MG tablet Commonly known as: NORVASC   ascorbic acid 500 MG tablet Commonly known as: VITAMIN C   fluconazole 100 MG tablet Commonly known as: DIFLUCAN   hydrocortisone 2.5 % rectal cream Commonly known as: ANUSOL-HC   promethazine 25 MG suppository Commonly known as: PHENERGAN     TAKE these medications   Accu-Chek Aviva Plus test strip Generic drug: glucose blood Use to check blood sugar 4 times daily. What changed:   how much to take  how to take this  when to take this  additional instructions   Accu-Chek Softclix Lancets lancets Use to monitor glucose levels 4 times per day; E11.9 What changed:   how much to take  how to take  this  when to take this   ALPRAZolam 0.5 MG tablet Commonly known as: XANAX Take 0.5 mg 2 (two) times daily as needed by mouth for anxiety or sleep.   aspirin EC 81 MG tablet Take 81 mg by mouth daily. Swallow whole.   azaTHIOprine 50 MG tablet Commonly known as: IMURAN Take 150 mg by mouth in the morning and at bedtime.   B-D UF III MINI PEN NEEDLES 31G X 5 MM Misc Generic drug: Insulin Pen Needle USE AS DIRECTED TWICE A DAY What changed:   how much to take  how to take this  when to take this  additional instructions   Easy Touch Pen Needles 32G X 4 MM Misc Generic drug: Insulin Pen Needle USE TO  INJECT INSULIN TWICE DAILY What changed: See the new instructions.   Benlysta 200 MG/ML Soaj Generic drug: Belimumab Inject 200 mg into the skin once a week. Tuesdays.   BiDil 20-37.5 MG tablet Generic drug: isosorbide-hydrALAZINE TAKE TWO (2) TABLETS BY MOUTH THREE TIMES DAILY What changed: See the new instructions.   buPROPion 300 MG 24 hr tablet Commonly known as: WELLBUTRIN XL Take 300 mg by mouth daily.   Byetta 10 MCG Pen 10 MCG/0.04ML Sopn injection Generic drug: exenatide INJECT 0.04ML UNDER THE SKIN 2 TIMES DAILY WITH A MEAL What changed:   how much to take  how to take this  when to take this  additional instructions   carvedilol 25 MG tablet Commonly known as: COREG Take 1 tablet (25 mg total) by mouth 2 (two) times daily with a meal.   cetaphil lotion Apply topically 2 (two) times daily. What changed:   how much to take  additional instructions   cetirizine 10 MG tablet Commonly known as: ZYRTEC Take 10 mg by mouth daily as needed for allergies.   clotrimazole-betamethasone cream Commonly known as: LOTRISONE Apply 1 application topically 2 (two) times daily. What changed:   when to take this  reasons to take this   cycloSPORINE 0.05 % ophthalmic emulsion Commonly known as: RESTASIS Place 1 drop into both eyes in the morning,  at noon, and at bedtime.   diclofenac Sodium 1 % Gel Commonly known as: VOLTAREN Apply 2 g topically 4 (four) times daily. What changed:   when to take this  reasons to take this   doxepin 50 MG capsule Commonly known as: SINEQUAN Take 50-100 mg by mouth at bedtime as needed (insomnia).   etonogestrel-ethinyl estradiol 0.12-0.015 MG/24HR vaginal ring Commonly known as: Balm 1 each every 28 (twenty-eight) days vaginally. Insert vaginally and leave in place for 3 consecutive weeks, then remove for 1 week.   famotidine 20 MG tablet Commonly known as: PEPCID Take 20 mg by mouth 2 (two) times daily.   feeding supplement (GLUCERNA SHAKE) Liqd Take 237 mLs by mouth 3 (three) times daily between meals.   fluticasone 50 MCG/ACT nasal spray Commonly known as: FLONASE Place 2 sprays into both nostrils continuous as needed for allergies or rhinitis.   hydroxychloroquine 200 MG tablet Commonly known as: PLAQUENIL Take 200 mg by mouth 2 (two) times daily.   insulin NPH Human 100 UNIT/ML injection Commonly known as: NOVOLIN N Inject 170 Units into the skin in the morning.   Insulin Syringe-Needle U-100 31G X 5/16" 1 ML Misc Commonly known as: Easy Touch Insulin Syringe USE AS DIRECTED THREE TIMES A DAY What changed:   how much to take  how to take this  when to take this  additional instructions   levothyroxine 50 MCG tablet Commonly known as: SYNTHROID Take 1 tablet (50 mcg total) by mouth daily at 6 (six) AM.   linezolid 600 MG tablet Commonly known as: Zyvox Take 1 tablet (600 mg total) by mouth 2 (two) times daily for 5 days.   Lyrica 75 MG capsule Generic drug: pregabalin Take 75 mg 3 (three) times daily by mouth.   megestrol 40 MG tablet Commonly known as: MEGACE Take 40 mg by mouth daily.   metFORMIN 500 MG 24 hr tablet Commonly known as: GLUCOPHAGE-XR Take 1,000 mg by mouth 2 (two) times daily.   mometasone-formoterol 100-5 MCG/ACT  Aero Commonly known as: DULERA Inhale 2 puffs into the lungs 2 (two) times daily.   multivitamin  with minerals Tabs tablet Take 1 tablet by mouth daily.   oxybutynin 10 MG 24 hr tablet Commonly known as: DITROPAN-XL Take 15 mg by mouth daily.   pravastatin 10 MG tablet Commonly known as: PRAVACHOL Take 10 mg by mouth daily.   predniSONE 1 MG tablet Commonly known as: DELTASONE Take 5 mg by mouth daily.   albuterol (2.5 MG/3ML) 0.083% nebulizer solution Commonly known as: PROVENTIL Take 2.5 mg by nebulization every 6 (six) hours as needed for wheezing.   ProAir HFA 108 (90 Base) MCG/ACT inhaler Generic drug: albuterol Inhale 2 puffs into the lungs every 6 (six) hours as needed for wheezing or shortness of breath.   sacubitril-valsartan 97-103 MG Commonly known as: ENTRESTO Take 1 tablet by mouth 2 (two) times daily.   spironolactone 50 MG tablet Commonly known as: ALDACTONE Take 1 tablet (50 mg total) by mouth daily.   sucralfate 1 GM/10ML suspension Commonly known as: CARAFATE Take 1 g by mouth 2 (two) times daily.   topiramate 25 MG tablet Commonly known as: TOPAMAX Take 75 mg by mouth at bedtime.   torsemide 20 MG tablet Commonly known as: DEMADEX Take 20 mg by mouth 2 (two) times daily.   traMADol 50 MG tablet Commonly known as: ULTRAM TAKE 1 TABLETS BY MOUTH EVERY 6 HOURS AS NEEDED FOR PAIN What changed:   how much to take  how to take this  when to take this  reasons to take this  additional instructions       Follow-up Information    [facility] Follow up.   Specialty: Home Health Services Why: For home health Services Contact information: [place] [address] [phone]        Bartholome Bill, MD. Schedule an appointment as soon as possible for a visit in 1 week(s).   Specialty: Family Medicine Contact information: [place]  [address] [phone]        Adrian Prows, MD. Schedule an appointment as soon as possible for a visit in 2 week(s).   Specialty: Cardiology Contact information: [place] [address] [phone]              Allergies  Allergen Reactions  . Metoclopramide Other (See Comments)    Developed restless leg, akathisia type limb movements.   . Codeine Itching, Rash and Other (See Comments)  . Penicillins Itching    Tolerated Unasyn 02/01/2020  . Hydrocodone Rash  . Percocet [Oxycodone-Acetaminophen] Hives and Rash    Tolerates dilaudid     Other Procedures/Studies: CT ABDOMEN PELVIS W CONTRAST  Result Date: 01/29/2020 CLINICAL DATA:  Fever, body aches, and sore throat for the past 2 days. Lower abdominal pain. COVID-19 positive. EXAM: CT ABDOMEN AND PELVIS WITH CONTRAST TECHNIQUE: Multidetector CT imaging of the abdomen and pelvis was performed using the standard protocol following bolus administration of intravenous contrast. CONTRAST:  127mL OMNIPAQUE IOHEXOL 300 MG/ML  SOLN COMPARISON:  CT abdomen pelvis dated October 23, 2019. FINDINGS: Lower chest: Small subpleural ground-glass densities at the lung bases with more focal consolidation versus atelectasis in the left lower lobe. Hepatobiliary: Heterogeneous hepatic parenchyma and ill-defined hypodense liver lesions are less conspicuous when compared to the prior study due to different phase of contrast. No obvious significant interval change. No new focal liver abnormality. The gallbladder is unremarkable. No biliary dilatation. Pancreas: Unremarkable. No pancreatic ductal dilatation or surrounding inflammatory changes. Spleen:  Normal in size without focal abnormality. Adrenals/Urinary Tract: Adrenal glands are unremarkable. Unchanged punctate calculus in the upper pole of the right kidney. No hydronephrosis. The bladder is unremarkable. Stomach/Bowel: Stomach is within normal limits. Appendix appears normal. No  evidence of bowel wall thickening, distention, or inflammatory changes. Vascular/Lymphatic: No significant vascular findings are present. No enlarged abdominal or pelvic lymph nodes. Reproductive: Uterus and bilateral adnexa are unremarkable. Other: No abdominal wall hernia or abnormality. No abdominopelvic ascites. No pneumoperitoneum. Musculoskeletal: No acute or significant osseous findings. IMPRESSION: 1. No acute intra-abdominal process. 2. Small subpleural ground-glass densities at the lung bases with more focal consolidation versus atelectasis in the left lower lobe. Findings are suspicious for COVID-19 pneumonia given clinical history. 3. Heterogeneous hepatic parenchyma and ill-defined hypodense liver lesions are less conspicuous when compared to the prior study due to different phase of contrast, but there is no obvious significant interval change. 4. Unchanged nonobstructive right nephrolithiasis. Electronically Signed   By: Titus Dubin M.D.   On: 01/29/2020 14:06   DG CHEST PORT 1 VIEW  Result Date: 02/01/2020 CLINICAL DATA:  COVID-19 positive patient.  Shortness of breath. EXAM: PORTABLE CHEST 1 VIEW COMPARISON:  Single-view of the chest 01/29/2020 and 10/23/2019. FINDINGS: There is new patchy bilateral airspace disease best seen in the upper lung zones and periphery of the right lung base. Cardiomegaly and vascular congestion again noted. IMPRESSION: New patchy bilateral airspace disease most consistent with pneumonia. Chronic cardiomegaly and vascular congestion. Electronically Signed   By: Inge Rise M.D.   On: 02/01/2020 13:50   DG Chest Portable 1 View  Result Date: 01/29/2020 CLINICAL DATA:  Dyspnea, fever, nausea and vomiting. EXAM: PORTABLE CHEST 1 VIEW COMPARISON:  Single-view of the chest 06/24/2020 and 08/13/2019. CT chest 10/05/2019. FINDINGS: There is cardiomegaly and pulmonary vascular congestion. No consolidative process, pneumothorax or effusion is identified. No acute  or focal bony abnormality. IMPRESSION: Cardiomegaly and pulmonary vascular congestion. Electronically Signed   By: Inge Rise M.D.   On: 01/29/2020 09:13   ECHOCARDIOGRAM LIMITED  Result Date: 02/02/2020    ECHOCARDIOGRAM LIMITED REPORT   Patient Name:   [name] Date of Exam: 02/02/2020 Medical Rec #:  [id number]     Height:       68.0 in Accession #:    [id number]    Weight:       373.5 lb Date of Birth:  1970/08/24     BSA:          2.665 m Patient Age:    59 years      BP:           142/77 mmHg Patient Gender: F             HR:           74 bpm. Exam Location:  [facility] Procedure: Limited Echo, Cardiac Doppler and Color Doppler Indications:    Bacteremia 790.7/R78.81  History:        Patient has prior history of Echocardiogram examinations, most                 recent 05/11/2018. Risk Factors:Morbid Obesity. CV19 positive.  Sonographer:    [name] RDCS Referring Phys: [place]  Sonographer Comments: This was a limited echo to rule out endocarditis in a patient positive for CV19. IMPRESSIONS  1. Left ventricular ejection fraction, by estimation, is 40 to 45%. The left ventricle has mildly decreased function. The left ventricle demonstrates global hypokinesis.  Left ventricular diastolic function could not be evaluated.  2. Right ventricular systolic function is normal. The right ventricular size is mildly enlarged. Tricuspid regurgitation signal is inadequate for assessing PA pressure.  3. The mitral valve is normal in structure. Trivial mitral valve regurgitation.  4. The aortic valve is normal in structure. Aortic valve regurgitation is not visualized. No aortic stenosis is present. Conclusion(s)/Recommendation(s): No evidence of valvular vegetations on this transthoracic echocardiogram. Would recommend a transesophageal echocardiogram to exclude infective endocarditis if clinically indicated. FINDINGS  Left Ventricle: Left ventricular ejection fraction, by estimation, is 40 to  45%. The left ventricle has mildly decreased function. The left ventricle demonstrates global hypokinesis. The left ventricular internal cavity size was normal in size. There is  no left ventricular hypertrophy. Right Ventricle: The right ventricular size is mildly enlarged. No increase in right ventricular wall thickness. Right ventricular systolic function is normal. Tricuspid regurgitation signal is inadequate for assessing PA pressure. Left Atrium: Left atrial size was normal in size. Right Atrium: Right atrial size was normal in size. Pericardium: There is no evidence of pericardial effusion. Mitral Valve: The mitral valve is normal in structure. There is mild thickening of the mitral valve leaflet(s). Trivial mitral valve regurgitation. Tricuspid Valve: The tricuspid valve is normal in structure. Tricuspid valve regurgitation is trivial. There is no evidence of tricuspid valve vegetation. Aortic Valve: The aortic valve is normal in structure. Aortic valve regurgitation is not visualized. No aortic stenosis is present. Pulmonic Valve: The pulmonic valve was not well visualized. Pulmonic valve regurgitation is trivial. Aorta: The aortic root is normal in size and structure. Venous: The inferior vena cava was not well visualized. IAS/Shunts: The interatrial septum was not well visualized.  LEFT VENTRICLE PLAX 2D LVIDd:         6.00 cm LVIDs:         4.50 cm LV PW:         1.00 cm LV IVS:        0.90 cm LVOT diam:     2.10 cm LVOT Area:     3.46 cm  LEFT ATRIUM         Index LA diam:    3.50 cm 1.31 cm/m   AORTA Ao Root diam: 3.10 cm Ao Asc diam:  3.50 cm  SHUNTS Systemic Diam: 2.10 cm Adrian Prows MD Electronically signed by Adrian Prows MD Signature Date/Time: 02/02/2020/1:01:46 PM    Final      TODAY-DAY OF DISCHARGE:  Subjective:   [name] today has no headache,no chest abdominal pain,no new weakness tingling or numbness, feels much better wants to go home today.   Objective:   Blood pressure (!)  118/94, pulse 67, temperature (!) 97.5 F (36.4 C), temperature source Axillary, resp. rate 13, height 5\' 8"  (1.727 m), weight (!) 154.6 kg, last menstrual period 03/24/2017, SpO2 97 %.  Intake/Output Summary (Last 24 hours) at 02/07/2020 0832 Last data filed at 02/07/2020 0130 Gross per 24 hour  Intake 610 ml  Output 500 ml  Net 110 ml   Filed Weights   02/04/20 0517 02/06/20 0500 02/07/20 0500  Weight: (!) 171.7 kg (!) 160 kg (!) 154.6 kg    Exam: Awake Alert, Oriented *3, No new F.N deficits, Normal affect [place].AT,PERRAL Supple Neck,No JVD, No cervical lymphadenopathy appriciated.  Symmetrical Chest wall movement, Good air movement bilaterally, CTAB RRR,No Gallops,Rubs or new Murmurs, No Parasternal Heave +ve B.Sounds, Abd Soft, Non tender, No organomegaly appriciated, No rebound -guarding or rigidity.  No Cyanosis, Clubbing or edema, No new Rash or bruise   PERTINENT RADIOLOGIC STUDIES: CT ABDOMEN PELVIS W CONTRAST  Result Date: 01/29/2020 CLINICAL DATA:  Fever, body aches, and sore throat for the past 2 days. Lower abdominal pain. COVID-19 positive. EXAM: CT ABDOMEN AND PELVIS WITH CONTRAST TECHNIQUE: Multidetector CT imaging of the abdomen and pelvis was performed using the standard protocol following bolus administration of intravenous contrast. CONTRAST:  128mL OMNIPAQUE IOHEXOL 300 MG/ML  SOLN COMPARISON:  CT abdomen pelvis dated October 23, 2019. FINDINGS: Lower chest: Small subpleural ground-glass densities at the lung bases with more focal consolidation versus atelectasis in the left lower lobe. Hepatobiliary: Heterogeneous hepatic parenchyma and ill-defined hypodense liver lesions are less conspicuous when compared to the prior study due to different phase of contrast. No obvious significant interval change. No new focal liver abnormality. The gallbladder is unremarkable. No biliary dilatation. Pancreas: Unremarkable. No pancreatic ductal dilatation or surrounding inflammatory  changes. Spleen: Normal in size without focal abnormality. Adrenals/Urinary Tract: Adrenal glands are unremarkable. Unchanged punctate calculus in the upper pole of the right kidney. No hydronephrosis. The bladder is unremarkable. Stomach/Bowel: Stomach is within normal limits. Appendix appears normal. No evidence of bowel wall thickening, distention, or inflammatory changes. Vascular/Lymphatic: No significant vascular findings are present. No enlarged abdominal or pelvic lymph nodes. Reproductive: Uterus and bilateral adnexa are unremarkable. Other: No abdominal wall hernia or abnormality. No abdominopelvic ascites. No pneumoperitoneum. Musculoskeletal: No acute or significant osseous findings. IMPRESSION: 1. No acute intra-abdominal process. 2. Small subpleural ground-glass densities at the lung bases with more focal consolidation versus atelectasis in the left lower lobe. Findings are suspicious for COVID-19 pneumonia given clinical history. 3. Heterogeneous hepatic parenchyma and ill-defined hypodense liver lesions are less conspicuous when compared to the prior study due to different phase of contrast, but there is no obvious significant interval change. 4. Unchanged nonobstructive right nephrolithiasis. Electronically Signed   By: Titus Dubin M.D.   On: 01/29/2020 14:06   DG CHEST PORT 1 VIEW  Result Date: 02/01/2020 CLINICAL DATA:  COVID-19 positive patient.  Shortness of breath. EXAM: PORTABLE CHEST 1 VIEW COMPARISON:  Single-view of the chest 01/29/2020 and 10/23/2019. FINDINGS: There is new patchy bilateral airspace disease best seen in the upper lung zones and periphery of the right lung base. Cardiomegaly and vascular congestion again noted. IMPRESSION: New patchy bilateral airspace disease most consistent with pneumonia. Chronic cardiomegaly and vascular congestion. Electronically Signed   By: Inge Rise M.D.   On: 02/01/2020 13:50   DG Chest Portable 1 View  Result Date:  01/29/2020 CLINICAL DATA:  Dyspnea, fever, nausea and vomiting. EXAM: PORTABLE CHEST 1 VIEW COMPARISON:  Single-view of the chest 06/24/2020 and 08/13/2019. CT chest 10/05/2019. FINDINGS: There is cardiomegaly and pulmonary vascular congestion. No consolidative process, pneumothorax or effusion is identified. No acute or focal bony abnormality. IMPRESSION: Cardiomegaly and pulmonary vascular congestion. Electronically Signed   By: Inge Rise M.D.   On: 01/29/2020 09:13   ECHOCARDIOGRAM LIMITED  Result Date: 02/02/2020    ECHOCARDIOGRAM LIMITED REPORT   Patient Name:   [name] Date of Exam: 02/02/2020 Medical Rec #:  [id number]     Height:       68.0 in Accession #:    [id number]    Weight:       373.5 lb Date of Birth:  1970/10/11     BSA:          2.665 m Patient Age:    25 years  BP:           142/77 mmHg Patient Gender: F             HR:           74 bpm. Exam Location:  [facility] Procedure: Limited Echo, Cardiac Doppler and Color Doppler Indications:    Bacteremia 790.7/R78.81  History:        Patient has prior history of Echocardiogram examinations, most                 recent 05/11/2018. Risk Factors:Morbid Obesity. CV19 positive.  Sonographer:    [name] RDCS Referring Phys: [place]  Sonographer Comments: This was a limited echo to rule out endocarditis in a patient positive for CV19. IMPRESSIONS  1. Left ventricular ejection fraction, by estimation, is 40 to 45%. The left ventricle has mildly decreased function. The left ventricle demonstrates global hypokinesis. Left ventricular diastolic function could not be evaluated.  2. Right ventricular systolic function is normal. The right ventricular size is mildly enlarged. Tricuspid regurgitation signal is inadequate for assessing PA pressure.  3. The mitral valve is normal in structure. Trivial mitral valve regurgitation.  4. The aortic valve is normal in structure. Aortic valve regurgitation is not visualized. No aortic  stenosis is present. Conclusion(s)/Recommendation(s): No evidence of valvular vegetations on this transthoracic echocardiogram. Would recommend a transesophageal echocardiogram to exclude infective endocarditis if clinically indicated. FINDINGS  Left Ventricle: Left ventricular ejection fraction, by estimation, is 40 to 45%. The left ventricle has mildly decreased function. The left ventricle demonstrates global hypokinesis. The left ventricular internal cavity size was normal in size. There is  no left ventricular hypertrophy. Right Ventricle: The right ventricular size is mildly enlarged. No increase in right ventricular wall thickness. Right ventricular systolic function is normal. Tricuspid regurgitation signal is inadequate for assessing PA pressure. Left Atrium: Left atrial size was normal in size. Right Atrium: Right atrial size was normal in size. Pericardium: There is no evidence of pericardial effusion. Mitral Valve: The mitral valve is normal in structure. There is mild thickening of the mitral valve leaflet(s). Trivial mitral valve regurgitation. Tricuspid Valve: The tricuspid valve is normal in structure. Tricuspid valve regurgitation is trivial. There is no evidence of tricuspid valve vegetation. Aortic Valve: The aortic valve is normal in structure. Aortic valve regurgitation is not visualized. No aortic stenosis is present. Pulmonic Valve: The pulmonic valve was not well visualized. Pulmonic valve regurgitation is trivial. Aorta: The aortic root is normal in size and structure. Venous: The inferior vena cava was not well visualized. IAS/Shunts: The interatrial septum was not well visualized.  LEFT VENTRICLE PLAX 2D LVIDd:         6.00 cm LVIDs:         4.50 cm LV PW:         1.00 cm LV IVS:        0.90 cm LVOT diam:     2.10 cm LVOT Area:     3.46 cm  LEFT ATRIUM         Index LA diam:    3.50 cm 1.31 cm/m   AORTA Ao Root diam: 3.10 cm Ao Asc diam:  3.50 cm  SHUNTS Systemic Diam: 2.10 cm Adrian Prows  MD Electronically signed by Adrian Prows MD Signature Date/Time: 02/02/2020/1:01:46 PM    Final      PERTINENT LAB RESULTS: CBC: Recent Labs    02/05/20 0239  WBC 8.4  HGB 10.5*  HCT 35.1*  PLT 255   CMET CMP     Component Value Date/Time   NA 140 02/07/2020 0315   NA 140 11/01/2018 1525   K 3.3 (L) 02/07/2020 0315   CL 105 02/07/2020 0315   CO2 26 02/07/2020 0315   GLUCOSE 163 (H) 02/07/2020 0315   BUN 19 02/07/2020 0315   BUN 8 11/01/2018 1525   CREATININE 1.10 (H) 02/07/2020 0315   CALCIUM 8.9 02/07/2020 0315   PROT 5.7 (L) 02/03/2020 0206   ALBUMIN 2.3 (L) 02/03/2020 0206   AST 13 (L) 02/03/2020 0206   ALT 10 02/03/2020 0206   ALKPHOS 75 02/03/2020 0206   BILITOT 0.6 02/03/2020 0206   GFRNONAA 59 (L) 02/07/2020 0315   GFRAA >60 02/07/2020 0315    GFR Estimated Creatinine Clearance: 97.9 mL/min (A) (by C-G formula based on SCr of 1.1 mg/dL (H)). No results for input(s): LIPASE, AMYLASE in the last 72 hours. No results for input(s): CKTOTAL, CKMB, CKMBINDEX, TROPONINI in the last 72 hours. Invalid input(s): POCBNP No results for input(s): DDIMER in the last 72 hours. No results for input(s): HGBA1C in the last 72 hours. No results for input(s): CHOL, HDL, LDLCALC, TRIG, CHOLHDL, LDLDIRECT in the last 72 hours. No results for input(s): TSH, T4TOTAL, T3FREE, THYROIDAB in the last 72 hours.  Invalid input(s): FREET3 No results for input(s): VITAMINB12, FOLATE, FERRITIN, TIBC, IRON, RETICCTPCT in the last 72 hours. Coags: No results for input(s): INR in the last 72 hours.  Invalid input(s): PT Microbiology: Recent Results (from the past 240 hour(s))  SARS Coronavirus 2 by RT PCR (hospital order, performed in [facility] hospital lab) Nasopharyngeal Nasopharyngeal Swab     Status: Abnormal   Collection Time: 01/29/20  9:56 AM   Specimen: Nasopharyngeal Swab  Result Value Ref Range Status   SARS Coronavirus 2 POSITIVE (A) NEGATIVE Final    Comment: RESULT CALLED  TO, READ BACK BY AND VERIFIED WITH: RN LOUIE CHILTON [id number] 01/29/20 KB (NOTE) SARS-CoV-2 target nucleic acids are DETECTED  SARS-CoV-2 RNA is generally detectable in upper respiratory specimens  during the acute phase of infection.  Positive results are indicative  of the presence of the identified virus, but do not rule out bacterial infection or co-infection with other pathogens not detected by the test.  Clinical correlation with patient history and  other diagnostic information is necessary to determine patient infection status.  The expected result is negative.  Fact Sheet for Patients:   StrictlyIdeas.no   Fact Sheet for Healthcare Providers:   BankingDealers.co.za    This test is not yet approved or cleared by the [place] FDA and  has been authorized for detection and/or diagnosis of SARS-CoV-2 by FDA under an Emergency Use Authorization (EUA).  This EUA will remain in effect (meaning this tes t can be used) for the duration of  the COVID-19 declaration under Section 564(b)(1) of the Act, 21 U.S.C. section 360-bbb-3(b)(1), unless the authorization is terminated or revoked sooner.  Performed at [facility], [place] [address] [place], [place] [address]   Culture, Urine     Status: Abnormal   Collection Time: 01/29/20  2:06 PM   Specimen: Urine, Random  Result Value Ref Range Status   Specimen Description URINE, RANDOM  Final   Special Requests NONE  Final   Culture (A)  Final    >=100,000 COLONIES/mL DIPHTHEROIDS(CORYNEBACTERIUM SPECIES) Standardized susceptibility testing for this organism is not available. Performed at [facility], [place]  [address], [place], [place] [address]    Report Status 01/30/2020 FINAL  Final  Culture, blood (Routine X 2) w Reflex to ID Panel     Status: Abnormal   Collection Time: 01/29/20  6:57 PM   Specimen: BLOOD  Result Value Ref Range Status   Specimen Description BLOOD  RIGHT ANTECUBITAL  Final   Special Requests   Final    BOTTLES DRAWN AEROBIC AND ANAEROBIC Blood Culture adequate volume   Culture  Setup Time   Final    IN BOTH AEROBIC AND ANAEROBIC BOTTLES GRAM POSITIVE RODS CRITICAL RESULT CALLED TO, READ BACK BY AND VERIFIED WITH: Salome Holmes [facility] 01/30/20 1938 JDW    Culture (A)  Final    CORYNEBACTERIUM STRIATUM ACTINOMYCES NEUII Standardized susceptibility testing for this organism is not available. Performed at [facility], [place] [address] [place], [place] [address]    Report Status 02/02/2020 FINAL  Final  Culture, blood (Routine X 2) w Reflex to ID Panel     Status: Abnormal   Collection Time: 01/29/20  7:05 PM   Specimen: BLOOD LEFT HAND  Result Value Ref Range Status   Specimen Description BLOOD LEFT HAND  Final   Special Requests   Final    BOTTLES DRAWN AEROBIC AND ANAEROBIC Blood Culture adequate volume   Culture  Setup Time   Final    ANAEROBIC BOTTLE ONLY GRAM POSITIVE RODS CRITICAL VALUE NOTED.  VALUE IS CONSISTENT WITH PREVIOUSLY REPORTED AND CALLED VALUE.    Culture (A)  Final    CORYNEBACTERIUM STRIATUM Standardized susceptibility testing for this organism is not available. Performed at [facility], [place] [address], [place], [place] [address]    Report Status 02/01/2020 FINAL  Final  Culture, blood (Routine X 2) w Reflex to ID Panel     Status: None (Preliminary result)   Collection Time: 02/02/20  6:03 AM   Specimen: BLOOD  Result Value Ref Range Status   Specimen Description BLOOD LEFT ANTECUBITAL  Final   Special Requests   Final    BOTTLES DRAWN AEROBIC AND ANAEROBIC Blood Culture adequate volume   Culture   Final    NO GROWTH 4 DAYS Performed at [facility], [place] [address] [place], [place] [address]    Report Status PENDING  Incomplete  Culture, blood (Routine X 2) w Reflex to ID Panel     Status: None (Preliminary result)   Collection Time: 02/02/20  6:03 AM   Specimen: BLOOD  Result Value  Ref Range Status   Specimen Description BLOOD RIGHT ANTECUBITAL  Final   Special Requests   Final    BOTTLES DRAWN AEROBIC AND ANAEROBIC Blood Culture adequate volume   Culture   Final    NO GROWTH 4 DAYS Performed at [facility], [place] [address], [place], [place] [address]    Report Status PENDING  Incomplete    FURTHER DISCHARGE INSTRUCTIONS:  Get Medicines reviewed and adjusted: Please take all your medications with you for your next visit with your Primary MD  Laboratory/radiological data: Please request your Primary MD to go over all hospital tests and procedure/radiological results at the follow up, please ask your Primary MD to get all Hospital records sent to his/her office.  In some cases, they will be blood work, cultures and biopsy results pending at the time of your discharge. Please request that your primary care M.D. goes through all the records of your hospital data and follows up on these  results.  Also Note the following: If you experience worsening of your admission symptoms, develop shortness of breath, life threatening emergency, suicidal or homicidal thoughts you must seek medical attention immediately by calling 911 or calling your MD immediately  if symptoms less severe.  You must read complete instructions/literature along with all the possible adverse reactions/side effects for all the Medicines you take and that have been prescribed to you. Take any new Medicines after you have completely understood and accpet all the possible adverse reactions/side effects.   Do not drive when taking Pain medications or sleeping medications (Benzodaizepines)  Do not take more than prescribed Pain, Sleep and Anxiety Medications. It is not advisable to combine anxiety,sleep and pain medications without talking with your primary care practitioner  Special Instructions: If you have smoked or chewed Tobacco  in the last 2 yrs please stop smoking, stop any regular Alcohol  and  or any Recreational drug use.  Wear Seat belts while driving.  Please note: You were cared for by a hospitalist during your hospital stay. Once you are discharged, your primary care physician will handle any further medical issues. Please note that NO REFILLS for any discharge medications will be authorized once you are discharged, as it is imperative that you return to your primary care physician (or establish a relationship with a primary care physician if you do not have one) for your post hospital discharge needs so that they can reassess your need for medications and monitor your lab values.  Total Time spent coordinating discharge including counseling, education and face to face time equals 35 minutes.  SignedOren Binet 02/07/2020 8:32 AM

## 2020-02-07 NOTE — TOC Transition Note (Signed)
Transition of Care [facility]) - CM/SW Discharge Note   Patient Details  Name: [name] MRN: [id number] Date of Birth: 12/07/1970  [facility] CM/SW Contact:  Verdell Carmine, RN Phone Number: 02/07/2020, 8:12 AM   Clinical Narrative:    Patient transisitioning home today with [facility] home services PT OT aide. NO further needs identified.   Final next level of care: [place] Barriers to Discharge: No Barriers Identified   Patient Goals and CMS Choice Patient states their goals for this hospitalization and ongoing recovery are:: to go home CMS Medicare.gov Compare Post Acute Care list provided to:: Patient Choice offered to / list presented to : Patient  Discharge Placement               Home with Home health        Discharge Plan and Services     Post Acute Care Choice: Home Health                    HH Arranged: Nurse's Aide [place]: Beatrice Date [place]: 02/06/20 Time [place]: 1223 Representative spoke with at [place]: Beryle Beams  Social Determinants of Health (SDOH) Interventions     Readmission Risk Interventions Readmission Risk Prevention Plan 02/05/2020  Transportation Screening Complete  Medication Review Press photographer) Referral to Pharmacy  [place] or [place] Complete  SW Recovery Care/Counseling Consult Complete  [place] Not Applicable  Some recent data might be hidden

## 2020-02-07 NOTE — Progress Notes (Signed)
[name] to be D/C'd Home per MD order.  Discussed with the patient and all questions fully answered.  VSS, Skin clean, dry and intact without evidence of skin break down, no evidence of skin tears noted. IV catheter discontinued intact. Site without signs and symptoms of complications. Dressing and pressure applied.  An After Visit Summary was printed and given to the patient. Patient received prescription.  D/c education completed with patient/family including follow up instructions, medication list, d/c activities limitations if indicated, with other d/c instructions as indicated by MD - patient able to verbalize understanding, all questions fully answered.   Patient instructed to return to ED, call 911, or call MD for any changes in condition.   Patient escorted via [place], and D/C home via private auto.  Patient prescriptions were deliver to the room prior to D/C home.   Quianna Avery A Odetta Pink Montejano 02/07/2020 11:11 AM

## 2020-02-07 NOTE — Discharge Instructions (Signed)
Person Under Monitoring Name: [name]  Location: [place] [address]   Infection Prevention Recommendations for Individuals Confirmed to have, or Being Evaluated for, 2019 Novel Coronavirus (COVID-19) Infection Who Receive Care at Home  Individuals who are confirmed to have, or are being evaluated for, COVID-19 should follow the prevention steps below until a healthcare provider or local or state health department says they can return to normal activities.  Stay home except to get medical care You should restrict activities outside your home, except for getting medical care. Do not go to work, school, or public areas, and do not use public transportation or taxis.  Call ahead before visiting your doctor Before your medical appointment, call the healthcare provider and tell them that you have, or are being evaluated for, COVID-19 infection. This will help the healthcare provider's office take steps to keep other people from getting infected. Ask your healthcare provider to call the local or state health department.  Monitor your symptoms Seek prompt medical attention if your illness is worsening (e.g., difficulty breathing). Before going to your medical appointment, call the healthcare provider and tell them that you have, or are being evaluated for, COVID-19 infection. Ask your healthcare provider to call the local or state health department.  Wear a facemask You should wear a facemask that covers your nose and mouth when you are in the same room with other people and when you visit a healthcare provider. People who live with or visit you should also wear a facemask while they are in the same room with you.  Separate yourself from other people in your home As much as possible, you should stay in a different room from other people in your home. Also, you should use a separate bathroom, if available.  Avoid sharing household items You should not share  dishes, drinking glasses, cups, eating utensils, towels, bedding, or other items with other people in your home. After using these items, you should wash them thoroughly with soap and water.  Cover your coughs and sneezes Cover your mouth and nose with a tissue when you cough or sneeze, or you can cough or sneeze into your sleeve. Throw used tissues in a lined trash can, and immediately wash your hands with soap and water for at least 20 seconds or use an alcohol-based hand rub.  Wash your [facility] your hands often and thoroughly with soap and water for at least 20 seconds. You can use an alcohol-based hand sanitizer if soap and water are not available and if your hands are not visibly dirty. Avoid touching your eyes, nose, and mouth with unwashed hands.   Prevention Steps for Caregivers and Household Members of Individuals Confirmed to have, or Being Evaluated for, COVID-19 Infection Being Cared for in the Home  If you live with, or provide care at home for, a person confirmed to have, or being evaluated for, COVID-19 infection please follow these guidelines to prevent infection:  Follow healthcare provider's instructions Make sure that you understand and can help the patient follow any healthcare provider instructions for all care.  Provide for the patient's basic needs You should help the patient with basic needs in the home and provide support for getting groceries, prescriptions, and other personal needs.  Monitor the patient's symptoms If they are getting sicker, call his or her medical provider and tell them that the patient has, or is being evaluated for, COVID-19 infection. This will help the healthcare provider's office  take steps to keep other people from getting infected. Ask the healthcare provider to call the local or state health department.  Limit the number of people who have contact with the patient  If possible, have only one caregiver for the patient.  Other  household members should stay in another home or place of residence. If this is not possible, they should stay  in another room, or be separated from the patient as much as possible. Use a separate bathroom, if available.  Restrict visitors who do not have an essential need to be in the home.  Keep older adults, very young children, and other sick people away from the patient Keep older adults, very young children, and those who have compromised immune systems or chronic health conditions away from the patient. This includes people with chronic heart, lung, or kidney conditions, diabetes, and cancer.  Ensure good ventilation Make sure that shared spaces in the home have good air flow, such as from an air conditioner or an opened window, weather permitting.  Wash your hands often  Wash your hands often and thoroughly with soap and water for at least 20 seconds. You can use an alcohol based hand sanitizer if soap and water are not available and if your hands are not visibly dirty.  Avoid touching your eyes, nose, and mouth with unwashed hands.  Use disposable paper towels to dry your hands. If not available, use dedicated cloth towels and replace them when they become wet.  Wear a facemask and gloves  Wear a disposable facemask at all times in the room and gloves when you touch or have contact with the patient's blood, body fluids, and/or secretions or excretions, such as sweat, saliva, sputum, nasal mucus, vomit, urine, or feces.  Ensure the mask fits over your nose and mouth tightly, and do not touch it during use.  Throw out disposable facemasks and gloves after using them. Do not reuse.  Wash your hands immediately after removing your facemask and gloves.  If your personal clothing becomes contaminated, carefully remove clothing and launder. Wash your hands after handling contaminated clothing.  Place all used disposable facemasks, gloves, and other waste in a lined container before  disposing them with other household waste.  Remove gloves and wash your hands immediately after handling these items.  Do not share dishes, glasses, or other household items with the patient  Avoid sharing household items. You should not share dishes, drinking glasses, cups, eating utensils, towels, bedding, or other items with a patient who is confirmed to have, or being evaluated for, COVID-19 infection.  After the person uses these items, you should wash them thoroughly with soap and water.  Wash laundry thoroughly  Immediately remove and wash clothes or bedding that have blood, body fluids, and/or secretions or excretions, such as sweat, saliva, sputum, nasal mucus, vomit, urine, or feces, on them.  Wear gloves when handling laundry from the patient.  Read and follow directions on labels of laundry or clothing items and detergent. In general, wash and dry with the warmest temperatures recommended on the label.  Clean all areas the individual has used often  Clean all touchable surfaces, such as counters, tabletops, doorknobs, bathroom fixtures, toilets, phones, keyboards, tablets, and bedside tables, every day. Also, clean any surfaces that may have blood, body fluids, and/or secretions or excretions on them.  Wear gloves when cleaning surfaces the patient has come in contact with.  Use a diluted bleach solution (e.g., dilute bleach with 1 part  bleach and 10 parts water) or a household disinfectant with a label that says EPA-registered for coronaviruses. To make a bleach solution at home, add 1 tablespoon of bleach to 1 quart (4 cups) of water. For a larger supply, add  cup of bleach to 1 gallon (16 cups) of water.  Read labels of cleaning products and follow recommendations provided on product labels. Labels contain instructions for safe and effective use of the cleaning product including precautions you should take when applying the product, such as wearing gloves or eye protection  and making sure you have good ventilation during use of the product.  Remove gloves and wash hands immediately after cleaning.  Monitor yourself for signs and symptoms of illness Caregivers and household members are considered close contacts, should monitor their health, and will be asked to limit movement outside of the home to the extent possible. Follow the monitoring steps for close contacts listed on the symptom monitoring form.   ? If you have additional questions, contact your local health department or call the epidemiologist on call at [phone] (available 24/7). ? This guidance is subject to change. For the most up-to-date guidance from [facility], please refer to their website: [email or web address]

## 2020-02-10 ENCOUNTER — Other Ambulatory Visit: Payer: Self-pay

## 2020-02-10 DIAGNOSIS — Z794 Long term (current) use of insulin: Secondary | ICD-10-CM

## 2020-02-10 DIAGNOSIS — E1143 Type 2 diabetes mellitus with diabetic autonomic (poly)neuropathy: Secondary | ICD-10-CM

## 2020-02-10 MED ORDER — BYETTA 10 MCG PEN 10 MCG/0.04ML ~~LOC~~ SOPN: PEN_INJECTOR | SUBCUTANEOUS | 1 refills | Status: DC

## 2020-02-10 MED ORDER — INSULIN NPH (HUMAN) (ISOPHANE) 100 UNIT/ML ~~LOC~~ SUSP: 170.0000 [IU] | Freq: Every morning | SUBCUTANEOUS | 1 refills | Status: DC

## 2020-02-10 NOTE — Telephone Encounter (Signed)
RX's have been sent in to the patient preferred pharmacy.

## 2020-02-10 NOTE — Telephone Encounter (Signed)
Patient scheduled for 03/12/20 - requests refill for Humulin and Byetta.  [facility] - [facility], [place]  [address], [place] [address]  Phone:  [phone] Fax:  [phone]

## 2020-02-18 ENCOUNTER — Other Ambulatory Visit: Payer: Self-pay | Admitting: Endocrinology

## 2020-02-18 DIAGNOSIS — E1143 Type 2 diabetes mellitus with diabetic autonomic (poly)neuropathy: Secondary | ICD-10-CM

## 2020-02-18 DIAGNOSIS — Z794 Long term (current) use of insulin: Secondary | ICD-10-CM

## 2020-02-20 ENCOUNTER — Other Ambulatory Visit: Payer: Self-pay | Admitting: Endocrinology

## 2020-02-20 ENCOUNTER — Telehealth: Payer: Self-pay | Admitting: Endocrinology

## 2020-02-20 DIAGNOSIS — E1143 Type 2 diabetes mellitus with diabetic autonomic (poly)neuropathy: Secondary | ICD-10-CM

## 2020-02-20 DIAGNOSIS — Z794 Long term (current) use of insulin: Secondary | ICD-10-CM

## 2020-02-20 NOTE — Telephone Encounter (Signed)
Patient called re: Patient had previously requested a refill for Humalin N and states that whomever sent in the RX, sent it incorrectly. Patient received 1 vial of the above mentioned medication instead of 7 vials (which lasts patient approx. 1 month). Patient requests that a new RX for Humalin N for 1 month supply with refills be sent asap to: [facility] - [facility] [address], [place] Phone:  [phone]  Fax:  [phone]     Patient requests to be called at ph# [phone] once the above RX has been sent to the [facility].

## 2020-02-21 ENCOUNTER — Other Ambulatory Visit: Payer: Self-pay

## 2020-02-21 ENCOUNTER — Telehealth: Payer: Self-pay

## 2020-02-21 ENCOUNTER — Encounter: Payer: Self-pay | Admitting: Endocrinology

## 2020-02-21 DIAGNOSIS — Z794 Long term (current) use of insulin: Secondary | ICD-10-CM

## 2020-02-21 DIAGNOSIS — E1143 Type 2 diabetes mellitus with diabetic autonomic (poly)neuropathy: Secondary | ICD-10-CM

## 2020-02-21 MED ORDER — INSULIN NPH (HUMAN) (ISOPHANE) 100 UNIT/ML ~~LOC~~ SUSP: 170.0000 [IU] | Freq: Every morning | SUBCUTANEOUS | 1 refills | Status: DC

## 2020-02-21 NOTE — Telephone Encounter (Signed)
Pharmacy calling for clarification.  Chart shows patient taking 170 units of Novolin N but patient is stating she takes Humulin N.  Patient also states she takes 200 units a day, not 170 units a day.  Please clarify.

## 2020-02-21 NOTE — Telephone Encounter (Signed)
Patient called again re: previous note "Patient called re:  Patient had previously requested a refill for Humalin N and states that whomever sent in the RX, sent it incorrectly. Patient received 1 vial of the above mentioned medication instead of 7 vials (which lasts patient approx. 1 month). Patient requests that a new RX for Humalin N for 1 month supply with refills be sent asap to: [facility] - [facility] [address], [place] Phone:  [phone]  Fax:  [phone]     Patient requests to be called at ph# [phone] once the above RX has been sent to the [facility]."  Patient states that the above  PHARM has not received the above RX request and patient requests the RX be sent asap to the [facility] listed above in previous note.

## 2020-02-21 NOTE — Telephone Encounter (Signed)
Humulin or novolin--either is fine 170 units qam 1.  Please schedule f/u appt 2.  Then please refill x 2 mos, pending that appt.

## 2020-02-21 NOTE — Telephone Encounter (Signed)
Patient requests to be called at ph# [phone]

## 2020-02-21 NOTE — Telephone Encounter (Signed)
Rx has been corrected. Patient does not use 7 vials though. Patient uses 170 units daily. At 170 units daily for 30 days, that is 5100 units per 30 days. That is barely over 5 vials. 6 vials of insulin were sent to pharmacy. 6 vials is slightly over 35 days worth of insulin.

## 2020-02-24 MED ORDER — INSULIN ISOPHANE HUMAN 100 UNIT/ML KWIKPEN: 200.0000 [IU] | PEN_INJECTOR | Freq: Every day | SUBCUTANEOUS | 11 refills | Status: DC

## 2020-02-24 NOTE — Telephone Encounter (Signed)
Awaiting clarification of orders from Dr. Loanne Drilling before addressing pt concern.

## 2020-02-24 NOTE — Telephone Encounter (Signed)
RX sent

## 2020-02-24 NOTE — Telephone Encounter (Signed)
FYI

## 2020-02-24 NOTE — Telephone Encounter (Signed)
Please advise if Rx should be changed as requested per pt:  Patient also states she takes 200 units a day, not 170 units a day.   Please clarify.

## 2020-02-24 NOTE — Telephone Encounter (Signed)
200 units qd is OK

## 2020-03-05 NOTE — Progress Notes (Signed)
Patient called in questioning whether she needed to have covid test. Patient tested positive for covid 19 on January 29, 2020. Covid test not needed at this time due to anesthesia guidelines. Patient made aware and verbalized understanding.

## 2020-03-07 ENCOUNTER — Other Ambulatory Visit (HOSPITAL_COMMUNITY): Payer: Medicaid Other

## 2020-03-11 ENCOUNTER — Ambulatory Visit (HOSPITAL_COMMUNITY): Admission: AD | Admit: 2020-03-11 | Payer: Medicaid Other | Source: Home / Self Care | Admitting: Gastroenterology

## 2020-03-11 ENCOUNTER — Encounter (HOSPITAL_COMMUNITY): Admission: AD | Payer: Self-pay | Source: Home / Self Care

## 2020-03-11 SURGERY — MANOMETRY, ESOPHAGUS

## 2020-03-12 ENCOUNTER — Ambulatory Visit: Payer: Medicaid Other | Admitting: Endocrinology

## 2020-03-16 ENCOUNTER — Encounter: Payer: Self-pay | Admitting: Endocrinology

## 2020-03-16 ENCOUNTER — Other Ambulatory Visit: Payer: Self-pay

## 2020-03-16 ENCOUNTER — Ambulatory Visit (INDEPENDENT_AMBULATORY_CARE_PROVIDER_SITE_OTHER): Payer: Medicaid Other | Admitting: Endocrinology

## 2020-03-16 VITALS — BP 130/90 | HR 110 | Ht 69.0 in | Wt 359.0 lb

## 2020-03-16 DIAGNOSIS — E1143 Type 2 diabetes mellitus with diabetic autonomic (poly)neuropathy: Secondary | ICD-10-CM

## 2020-03-16 DIAGNOSIS — Z794 Long term (current) use of insulin: Secondary | ICD-10-CM | POA: Diagnosis not present

## 2020-03-16 MED ORDER — INSULIN ISOPHANE HUMAN 100 UNIT/ML KWIKPEN: 210.0000 [IU] | PEN_INJECTOR | Freq: Every day | SUBCUTANEOUS | 11 refills | Status: DC

## 2020-03-16 NOTE — Progress Notes (Signed)
Subjective:    Patient ID: [name], female    DOB: 04-Apr-1971, 49 y.o.   MRN: [id number]  HPI Pt returns for f/u of diabetes mellitus: DM type: Insulin-requiring type 2 Dx'ed: 0940 Complications: polyneuropathy and gastroparesis.  Therapy: insulin (since 2014), Byetta, and metformin.   GDM: never.  DKA: never.  Severe hypoglycemia: never.  Pancreatitis: never.  Other: she is too ill to undergo weight-loss surgery; she takes qd insulin, after poor results with multiple daily injections; she takes NPH, due to pattern of cbg's.   Interval history: She takes 200 units qam.  Prednisone is now 5 mg qd.  Pt says she missed the insulin in July, but not in August.  no cbg record, but states since back on the insulin, cbg's vary from 109-199.   Past Medical History:  Diagnosis Date  . Acute renal failure (HCC)     Intractable nausea vomiting secondary to diabetic gastroparesis causing dehydration and acute renal failure /notes 04/01/2013  . Anginal pain ([place])   . Anxiety   . Bell's palsy 08/09/2010  . Bicornuate uterus   . CAP (community acquired pneumonia) 10/2011   Archie Endo 11/08/2011  . Chest pain 01/13/2014  . CHF (congestive heart failure) ([place])   . Diabetic gastroparesis associated with type 2 diabetes mellitus ([place])    this is presumed diagnoses, not confirmed by any studies.   . Eczema   . Family history of malignant neoplasm of breast   . Family history of malignant neoplasm of ovary   . GERD (gastroesophageal reflux disease)   . High cholesterol   . Hypertension   . Liver lesion 08/13/2019  . Liver mass 2014   biopsied 03/2013 at [facility], not malignant.  is to undergo radiologic ablation of the mass in sept/October 2014.   . Lupus ([place])   . Migraines    "maybe a couple times/yr" (04/01/2013)  . Obesity   . Obstructive sleep apnea on CPAP 2011   Oxygen at nights  . SJOGREN'S SYNDROME 08/09/2010  . SLE (systemic lupus erythematosus) ([place])    Archie Endo 12/01/2010  . Stroke  [facility]) 2010; 10/2012   "left side is still weak from it, never fully regained full strength; no additions from stroke 10/2012"    Past Surgical History:  Procedure Laterality Date  . BREAST CYST EXCISION Left 08/2005   epidermoid  . CARDIAC CATHETERIZATION  02/07/12  . ECTOPIC PREGNANCY SURGERY  1999  . ECTOPIC PREGNANCY SURGERY  1999  . ESOPHAGOGASTRODUODENOSCOPY N/A 02/26/2013   Procedure: ESOPHAGOGASTRODUODENOSCOPY (EGD);  Surgeon: Irene Shipper, MD;  Location: [facility] END[facility];  Service: Endoscopy;  Laterality: N/A;  . ESOPHAGOGASTRODUODENOSCOPY N/A 03/02/2015   Procedure: ESOPHAGOGASTRODUODENOSCOPY (EGD);  Surgeon: Milus Banister, MD;  Location: [place];  Service: Endoscopy;  Laterality: N/A;  . HERNIA REPAIR    . LEFT AND RIGHT HEART CATHETERIZATION WITH CORONARY ANGIOGRAM N/A 02/07/2012   Procedure: LEFT AND RIGHT HEART CATHETERIZATION WITH CORONARY ANGIOGRAM;  Surgeon: Laverda Page, MD;  Location: [facility] CATH LAB;  Service: Cardiovascular;  Laterality: N/A;  . LIVER BIOPSY  03/2013   liver mass/medical hx noted above  . MUSCLE BIOPSY     for lupus/notes 12/01/2010  . UMBILICAL HERNIA REPAIR  1980's    Social History   Socioeconomic History  . Marital status: Single    Spouse name: Not on file  . Number of children: 1  . Years of education: Not on file  . Highest education level: Not on file  Occupational History    Employer: UNEMPLOYED    Comment: student  Tobacco Use  . Smoking status: Former Smoker    Packs/day: 0.50    Years: 3.00    Pack years: 1.50    Types: Cigarettes    Quit date: 06/02/1991    Years since quitting: 28.8  . Smokeless tobacco: Never Used  . Tobacco comment: quit 28 yrs ago  Vaping Use  . Vaping Use: Never used  Substance and Sexual Activity  . Alcohol use: No    Alcohol/week: 0.0 standard drinks  . Drug use: No  . Sexual activity: Yes    Partners: Male    Birth control/protection: None  Other Topics Concern  . Not on file  Social History  Narrative   Regular exercise-yes   Social Determinants of Health   Financial Resource Strain:   . Difficulty of Paying Living Expenses:   Food Insecurity:   . Worried About Charity fundraiser in the Last Year:   . Arboriculturist in the Last Year:   Transportation Needs:   . Film/video editor (Medical):   Marland Kitchen Lack of Transportation (Non-Medical):   Physical Activity:   . Days of Exercise per Week:   . Minutes of Exercise per Session:   Stress:   . Feeling of Stress :   Social Connections:   . Frequency of Communication with Friends and Family:   . Frequency of Social Gatherings with Friends and Family:   . Attends Religious Services:   . Active Member of Clubs or Organizations:   . Attends Archivist Meetings:   Marland Kitchen Marital Status:   Intimate Partner Violence:   . Fear of Current or Ex-Partner:   . Emotionally Abused:   Marland Kitchen Physically Abused:   . Sexually Abused:     Current Outpatient Medications on File Prior to Visit  Medication Sig Dispense Refill  . Accu-Chek Softclix Lancets lancets Use to monitor glucose levels 4 times per day; E11.9 (Patient taking differently: 1 each by Other route See admin instructions. Use to monitor glucose levels 4 times per day; E11.9) 200 each 2  . albuterol (PROAIR HFA) 108 (90 BASE) MCG/ACT inhaler Inhale 2 puffs into the lungs every 6 (six) hours as needed for wheezing or shortness of breath.     Marland Kitchen albuterol (PROVENTIL) (2.5 MG/3ML) 0.083% nebulizer solution Take 2.5 mg by nebulization every 6 (six) hours as needed for wheezing.    Marland Kitchen ALPRAZolam (XANAX) 0.5 MG tablet Take 0.5 mg 2 (two) times daily as needed by mouth for anxiety or sleep.     Marland Kitchen aspirin EC 81 MG tablet Take 81 mg by mouth daily. Swallow whole.    . azaTHIOprine (IMURAN) 50 MG tablet Take 150 mg by mouth in the morning and at bedtime.     . B-D UF III MINI PEN NEEDLES 31G X 5 MM MISC USE AS DIRECTED TWICE A DAY (Patient taking differently: 1 each by Other route in the  morning and at bedtime. ) 100 each 2  . Belimumab (BENLYSTA) 200 MG/ML SOAJ Inject 200 mg into the skin once a week. Tuesdays.    Marland Kitchen BIDIL 20-37.5 MG tablet TAKE TWO (2) TABLETS BY MOUTH THREE TIMES DAILY  (Patient taking differently: Take 2 tablets by mouth 3 (three) times daily. ) 180 tablet 3  . buPROPion (WELLBUTRIN XL) 300 MG 24 hr tablet Take 300 mg by mouth daily.    . carvedilol (COREG) 25 MG tablet Take  1 tablet (25 mg total) by mouth 2 (two) times daily with a meal. 60 tablet 0  . cetaphil (CETAPHIL) lotion Apply topically 2 (two) times daily. (Patient taking differently: Apply 1 application topically 2 (two) times daily. Dry skin) 236 mL 0  . cetirizine (ZYRTEC) 10 MG tablet Take 10 mg by mouth daily as needed for allergies.     . clotrimazole-betamethasone (LOTRISONE) cream Apply 1 application topically 2 (two) times daily. (Patient taking differently: Apply 1 application topically daily as needed (rash). ) 45 g 3  . cycloSPORINE (RESTASIS) 0.05 % ophthalmic emulsion Place 1 drop into both eyes in the morning, at noon, and at bedtime.     . diclofenac Sodium (VOLTAREN) 1 % GEL Apply 2 g topically 4 (four) times daily. (Patient taking differently: Apply 2 g topically daily as needed (pain). ) 350 g 0  . doxepin (SINEQUAN) 50 MG capsule Take 50-100 mg by mouth at bedtime as needed (insomnia).    Marland Kitchen EASY TOUCH PEN NEEDLES 32G X 4 MM MISC USE TO INJECT INSULIN TWICE DAILY (Patient taking differently: 1 each by Other route in the morning and at bedtime. ) 100 each 9  . etonogestrel-ethinyl estradiol (NUVARING) 0.12-0.015 MG/24HR vaginal ring Place 1 each every 28 (twenty-eight) days vaginally. Insert vaginally and leave in place for 3 consecutive weeks, then remove for 1 week.    Marland Kitchen exenatide (BYETTA 10 MCG PEN) 10 MCG/0.04ML SOPN injection INJECT 0.04ML UNDER THE SKIN 2 TIMES DAILY WITH A MEAL 1 pen 1  . famotidine (PEPCID) 20 MG tablet Take 20 mg by mouth 2 (two) times daily.    . fluticasone  (FLONASE) 50 MCG/ACT nasal spray Place 2 sprays into both nostrils continuous as needed for allergies or rhinitis.    . hydroxychloroquine (PLAQUENIL) 200 MG tablet Take 200 mg by mouth 2 (two) times daily.     . Insulin Syringe-Needle U-100 (EASY TOUCH INSULIN SYRINGE) 31G X 5/16" 1 ML MISC USE AS DIRECTED THREE TIMES A DAY (Patient taking differently: 1 each by Other route in the morning, at noon, and at bedtime. ) 100 each 10  . levothyroxine (SYNTHROID) 50 MCG tablet Take 1 tablet (50 mcg total) by mouth daily at 6 (six) AM.    . megestrol (MEGACE) 40 MG tablet Take 40 mg by mouth daily.  3  . metFORMIN (GLUCOPHAGE-XR) 500 MG 24 hr tablet Take 1,000 mg by mouth 2 (two) times daily.    . mometasone-formoterol (DULERA) 100-5 MCG/ACT AERO Inhale 2 puffs into the lungs 2 (two) times daily.    . Multiple Vitamin (MULTIVITAMIN WITH MINERALS) TABS tablet Take 1 tablet by mouth daily.    Marland Kitchen oxybutynin (DITROPAN-XL) 10 MG 24 hr tablet Take 15 mg by mouth daily.     . pravastatin (PRAVACHOL) 10 MG tablet Take 10 mg by mouth daily.    . predniSONE (DELTASONE) 1 MG tablet Take 5 mg by mouth daily.     . pregabalin (LYRICA) 75 MG capsule Take 75 mg 3 (three) times daily by mouth.     . sacubitril-valsartan (ENTRESTO) 97-103 MG Take 1 tablet by mouth 2 (two) times daily. 180 tablet 3  . spironolactone (ALDACTONE) 50 MG tablet Take 1 tablet (50 mg total) by mouth daily. 90 tablet 3  . sucralfate (CARAFATE) 1 GM/10ML suspension Take 1 g by mouth 2 (two) times daily.    Marland Kitchen topiramate (TOPAMAX) 25 MG tablet Take 75 mg by mouth at bedtime.    . torsemide (DEMADEX)  20 MG tablet Take 20 mg by mouth 2 (two) times daily.    . traMADol (ULTRAM) 50 MG tablet TAKE 1 TABLETS BY MOUTH EVERY 6 HOURS AS NEEDED FOR PAIN (Patient taking differently: Take 50 mg by mouth every 6 (six) hours as needed for moderate pain or severe pain. ) 20 tablet 0   No current facility-administered medications on file prior to visit.     Allergies  Allergen Reactions  . Metoclopramide Other (See Comments)    Developed restless leg, akathisia type limb movements.   . Codeine Itching, Rash and Other (See Comments)  . Penicillins Itching    Tolerated Unasyn 02/01/2020  . Hydrocodone Rash  . Percocet [Oxycodone-Acetaminophen] Hives and Rash    Tolerates dilaudid    Family History  Problem Relation Age of Onset  . Diabetes Mother   . Asthma Mother   . Urticaria Mother   . Hypertension Mother   . Breast cancer Maternal Aunt 22       currently 6  . Stomach cancer Maternal Uncle        dx 47s; deceased  . Prostate cancer Maternal Uncle        deceased 63s  . Cancer Maternal Aunt        unk. primary  . Cancer Maternal Aunt        unk. primary  . Ovarian cancer Maternal Aunt        deceased 26s  . Hypertension Other   . Stroke Other   . Arthritis Other   . Ovarian cancer Sister 24       maternal half-sister; RAD51C positive  . Cancer Paternal Aunt        unk. primary; deceased 65s  . Prostate cancer Paternal Uncle        deceased 87s  . Breast cancer Cousin        deceased 83s; daughter of mat uncle with stomach ca  . Breast cancer Maternal Aunt        deceased 79s  . Asthma Brother   . Asthma Brother   . Allergic rhinitis Neg Hx   . Angioedema Neg Hx     BP 130/90   Pulse (!) 110   Ht 5' 9"  (1.753 m)   Wt (!) 359 lb (162.8 kg)   LMP 03/24/2017   SpO2 98%   BMI 53.02 kg/m    Review of Systems She denies hypoglycemia.  No recent nausea.      Objective:   Physical Exam VITAL SIGNS:  See vs page GENERAL: no distress Pulses: dorsalis pedis intact bilat.   MSK: no deformity of the feet CV: 1+ bilat leg edema Skin:  no ulcer on the feet, but the skin is dry.  normal color and temp on the feet.   Neuro: sensation is intact to touch on the feet.   Lab Results  Component Value Date   CREATININE 1.10 (H) 02/07/2020   BUN 19 02/07/2020   NA 140 02/07/2020   K 3.3 (L) 02/07/2020   CL 105  02/07/2020   CO2 26 02/07/2020   A1c=8.4%    Assessment & Plan:  Insulin-requiring type 2 DM: uncontrolled.   Patient Instructions  check your blood sugar 4 times a day: before the 3 meals, and at bedtime.  also check if you have symptoms of your blood sugar being too high or too low.  please keep a record of the readings and bring it to your next appointment here (or you can  bring the meter itself).  You can write it on any piece of paper.  please call us sooner if your blood sugar goes below 70, or if you have a lot of readings over 200, especially if you have to increase the prednisone again.   Please increase the insulin to 210 units each morning, and:  continue the same other medications for now.  On this type of insulin schedule, you should eat meals on a regular schedule (especially lunch).  If a meal is missed or significantly delayed, your blood sugar could go low.   Please come back for a follow-up appointment in 1 month.

## 2020-03-16 NOTE — Patient Instructions (Addendum)
check your blood sugar 4 times a day: before the 3 meals, and at bedtime.  also check if you have symptoms of your blood sugar being too high or too low.  please keep a record of the readings and bring it to your next appointment here (or you can bring the meter itself).  You can write it on any piece of paper.  please call us sooner if your blood sugar goes below 70, or if you have a lot of readings over 200, especially if you have to increase the prednisone again.   Please increase the insulin to 210 units each morning, and:  continue the same other medications for now.  On this type of insulin schedule, you should eat meals on a regular schedule (especially lunch).  If a meal is missed or significantly delayed, your blood sugar could go low.   Please come back for a follow-up appointment in 1 month.

## 2020-03-18 ENCOUNTER — Other Ambulatory Visit: Payer: Self-pay

## 2020-03-18 DIAGNOSIS — Z794 Long term (current) use of insulin: Secondary | ICD-10-CM

## 2020-03-18 DIAGNOSIS — E1143 Type 2 diabetes mellitus with diabetic autonomic (poly)neuropathy: Secondary | ICD-10-CM

## 2020-03-18 MED ORDER — ACCU-CHEK AVIVA PLUS VI STRP: 1.0000 | ORAL_STRIP | Freq: Four times a day (QID) | 1 refills | Status: DC

## 2020-03-23 ENCOUNTER — Ambulatory Visit: Payer: Medicaid Other | Admitting: Cardiology

## 2020-04-10 ENCOUNTER — Telehealth: Payer: Self-pay | Admitting: Endocrinology

## 2020-04-10 ENCOUNTER — Telehealth: Payer: Self-pay

## 2020-04-10 NOTE — Telephone Encounter (Signed)
PRIOR AUTHORIZATION  PA initiation date: 04/10/20  Medication: Humulin [place]: [place] completed electronically through [employer] [facility] [employer]: Yes PA form completed and faxed to [employer]: Yes  Will await insurance response re: approval/denial.

## 2020-04-10 NOTE — Telephone Encounter (Signed)
Patient called to advise that pharmacy sent a PA for Humulin N today.

## 2020-04-14 ENCOUNTER — Other Ambulatory Visit: Payer: Self-pay

## 2020-04-14 ENCOUNTER — Telehealth: Payer: Self-pay | Admitting: Endocrinology

## 2020-04-14 DIAGNOSIS — Z794 Long term (current) use of insulin: Secondary | ICD-10-CM

## 2020-04-14 DIAGNOSIS — E1143 Type 2 diabetes mellitus with diabetic autonomic (poly)neuropathy: Secondary | ICD-10-CM

## 2020-04-14 MED ORDER — ACCU-CHEK SOFTCLIX LANCETS MISC: 1.0000 | Freq: Four times a day (QID) | 2 refills | Status: DC

## 2020-04-14 NOTE — Telephone Encounter (Signed)
Medication Refill Request  Did you call your pharmacy and request this refill first? Yes  . If patient has not contacted pharmacy first, instruct them to do so for future refills.  . Remind them that contacting the pharmacy for their refill is the quickest method to get the refill.  . Refill policy also stated that it will take anywhere between 24-72 hours to receive the refill.    Name of medication? Soft Click Lancets  Is this a 90 day supply? yes   Name and location of pharmacy? [facility] on [address]  . Is the request for diabetes test strips? NO . If yes, what brand? N/A

## 2020-04-14 NOTE — Telephone Encounter (Signed)
Outpatient Medication Detail   Disp Refills Start End   Accu-Chek Softclix Lancets lancets 400 each 2 04/14/2020    Sig - Route: 1 each by Other route 4 (four) times daily. Use to monitor glucose levels 4 times per day; E11.9 - Other   Sent to pharmacy as: Accu-Chek Softclix Lancets lancets   E-Prescribing Status: Receipt confirmed by pharmacy (04/14/2020 11:28 AM EDT)

## 2020-04-15 ENCOUNTER — Other Ambulatory Visit: Payer: Self-pay

## 2020-04-15 DIAGNOSIS — E1143 Type 2 diabetes mellitus with diabetic autonomic (poly)neuropathy: Secondary | ICD-10-CM

## 2020-04-15 DIAGNOSIS — Z794 Long term (current) use of insulin: Secondary | ICD-10-CM

## 2020-04-15 MED ORDER — INSULIN ISOPHANE HUMAN 100 UNIT/ML KWIKPEN: 210.0000 [IU] | PEN_INJECTOR | Freq: Every day | SUBCUTANEOUS | 11 refills | Status: DC

## 2020-04-16 NOTE — Telephone Encounter (Signed)
Pt was misinformed about the clinical information provided to [facility]. Following was included in PA:  NON-THERAPEUTIC - METFORMIN  TRIED AND FAILED HUMULIN N, LANTUS AND LEVEMIR IN VIAL FORMAT DUE TO NEUROPATHY, BELL'S PALSY AND POOR DEXTERITY. UNABLE TO DRAW UP MEDICATION FROM A VIAL. LANTUS AND LEVEMIR VIAL AND PEN FORMAT RESULTED IN RENAL FAILURE AS INDICATED DURING HOSPITALIZATION FROM COVID EXPOSURE. UNABLE TO TAKE MULTIPLE INJECTIONS DUE TO SEVERE HYPER/HYPOGLYCEMIC EFFECTS  PA has been resubmitted advising of above information as it had been documented and sent previously.

## 2020-04-16 NOTE — Telephone Encounter (Signed)
Patient requests to be called at ph# [phone] for status of the information below (Patient is almost out of Humalin [employer]) re: Patient was contacted by [employer] and was told that PA was denied due to Dr. Loanne Drilling did not put that patient had been on 2 different medications prior that did not work and Dr. Loanne Drilling will need to call [phone] ([place]) to update PA information.

## 2020-04-20 ENCOUNTER — Telehealth: Payer: Self-pay

## 2020-04-20 NOTE — Telephone Encounter (Signed)
This was just faxed on Friday 04/17/20. No response has been received to date.

## 2020-04-20 NOTE — Telephone Encounter (Signed)
New message    The patient checking on the status of prior authorization.

## 2020-04-21 ENCOUNTER — Other Ambulatory Visit: Payer: Self-pay

## 2020-04-21 ENCOUNTER — Ambulatory Visit (INDEPENDENT_AMBULATORY_CARE_PROVIDER_SITE_OTHER): Payer: Medicaid Other | Admitting: Endocrinology

## 2020-04-21 ENCOUNTER — Encounter: Payer: Self-pay | Admitting: Endocrinology

## 2020-04-21 VITALS — BP 126/82 | HR 108 | Ht 68.0 in | Wt 363.0 lb

## 2020-04-21 DIAGNOSIS — E1143 Type 2 diabetes mellitus with diabetic autonomic (poly)neuropathy: Secondary | ICD-10-CM

## 2020-04-21 DIAGNOSIS — Z794 Long term (current) use of insulin: Secondary | ICD-10-CM

## 2020-04-21 LAB — POCT GLYCOSYLATED HEMOGLOBIN (HGB A1C): Hemoglobin A1C: 12.5 % — AB (ref 4.0–5.6)

## 2020-04-21 MED ORDER — LANTUS SOLOSTAR 100 UNIT/ML ~~LOC~~ SOPN
200.0000 [IU] | PEN_INJECTOR | SUBCUTANEOUS | 99 refills | Status: DC
Start: 2020-04-21 — End: 2020-05-21

## 2020-04-21 MED ORDER — ACCU-CHEK AVIVA PLUS VI STRP: 1.0000 | ORAL_STRIP | Freq: Four times a day (QID) | 3 refills | Status: DC

## 2020-04-21 NOTE — Patient Instructions (Addendum)
check your blood sugar 4 times a day: before the 3 meals, and at bedtime.  also check if you have symptoms of your blood sugar being too high or too low.  please keep a record of the readings and bring it to your next appointment here (or you can bring the meter itself).  You can write it on any piece of paper.  please call us sooner if your blood sugar goes below 70, or if you have a lot of readings over 200, especially if you have to increase the prednisone again.   Please change the insulin back to Lantus, 200 units each morning, and:  continue the same other medications for now.  On this type of insulin schedule, you should eat meals on a regular schedule (especially lunch).  If a meal is missed or significantly delayed, your blood sugar could go low.   Please come back for a follow-up appointment in 2 months.

## 2020-04-21 NOTE — Progress Notes (Signed)
Subjective:    Patient ID: [name], female    DOB: 02-28-1971, 49 y.o.   MRN: [id number]  HPI Pt returns for f/u of diabetes mellitus: DM type: Insulin-requiring type 2 Dx'ed: 3220 Complications: polyneuropathy and gastroparesis.  Therapy: insulin (since 2014), [place], and metformin.   GDM: never.  DKA: never.  Severe hypoglycemia: never.  Pancreatitis: never.  Other: she is too ill to undergo weight-loss surgery; she takes qd insulin, after poor results with multiple daily injections; she takes NPH, due to pattern of cbg's.   Interval history: Pt says she is out of insulin.  Pt says medicaid declined to pay for NPH, as we did not submit 2 other meds tried and failed.  I reviewed form, and it does in fact show multiple other drug failures.  pt states she feels well in general. Past Medical History:  Diagnosis Date  . Acute renal failure (HCC)     Intractable nausea vomiting secondary to diabetic gastroparesis causing dehydration and acute renal failure /notes 04/01/2013  . Anginal pain ([place])   . Anxiety   . Bell's palsy 08/09/2010  . Bicornuate uterus   . CAP (community acquired pneumonia) 10/2011   Archie Endo 11/08/2011  . Chest pain 01/13/2014  . CHF (congestive heart failure) ([place])   . Diabetic gastroparesis associated with type 2 diabetes mellitus ([place])    this is presumed diagnoses, not confirmed by any studies.   . Eczema   . Family history of malignant neoplasm of breast   . Family history of malignant neoplasm of ovary   . GERD (gastroesophageal reflux disease)   . High cholesterol   . Hypertension   . Liver lesion 08/13/2019  . Liver mass 2014   biopsied 03/2013 at [facility], not malignant.  is to undergo radiologic ablation of the mass in sept/October 2014.   . Lupus ([place])   . Migraines    "maybe a couple times/yr" (04/01/2013)  . Obesity   . Obstructive sleep apnea on CPAP 2011   Oxygen at nights  . SJOGREN'S SYNDROME 08/09/2010  . SLE (systemic lupus  erythematosus) ([place])    Archie Endo 12/01/2010  . Stroke [facility]) 2010; 10/2012   "left side is still weak from it, never fully regained full strength; no additions from stroke 10/2012"    Past Surgical History:  Procedure Laterality Date  . BREAST CYST EXCISION Left 08/2005   epidermoid  . CARDIAC CATHETERIZATION  02/07/12  . ECTOPIC PREGNANCY SURGERY  1999  . ECTOPIC PREGNANCY SURGERY  1999  . ESOPHAGOGASTRODUODENOSCOPY N/A 02/26/2013   Procedure: ESOPHAGOGASTRODUODENOSCOPY (EGD);  Surgeon: Irene Shipper, MD;  Location: [facility] ENDOSCOPY;  Service: Endoscopy;  Laterality: N/A;  . ESOPHAGOGASTRODUODENOSCOPY N/A 03/02/2015   Procedure: ESOPHAGOGASTRODUODENOSCOPY (EGD);  Surgeon: Milus Banister, MD;  Location: [place];  Service: Endoscopy;  Laterality: N/A;  . HERNIA REPAIR    . LEFT AND RIGHT HEART CATHETERIZATION WITH CORONARY ANGIOGRAM N/A 02/07/2012   Procedure: LEFT AND RIGHT HEART CATHETERIZATION WITH CORONARY ANGIOGRAM;  Surgeon: Laverda Page, MD;  Location: [facility] CATH LAB;  Service: Cardiovascular;  Laterality: N/A;  . LIVER BIOPSY  03/2013   liver mass/medical hx noted above  . MUSCLE BIOPSY     for lupus/notes 12/01/2010  . UMBILICAL HERNIA REPAIR  1980's    Social History   Socioeconomic History  . Marital status: Single    Spouse name: Not on file  . Number of children: 1  . Years of education: Not on file  .  Highest education level: Not on file  Occupational History    Employer: UNEMPLOYED    Comment: student  Tobacco Use  . Smoking status: Former Smoker    Packs/day: 0.50    Years: 3.00    Pack years: 1.50    Types: Cigarettes    Quit date: 06/02/1991    Years since quitting: 28.9  . Smokeless tobacco: Never Used  . Tobacco comment: quit 28 yrs ago  Vaping Use  . Vaping Use: Never used  Substance and Sexual Activity  . Alcohol use: No    Alcohol/week: 0.0 standard drinks  . Drug use: No  . Sexual activity: Yes    Partners: Male    Birth control/protection: None  Other  Topics Concern  . Not on file  Social History Narrative   Regular exercise-yes   Social Determinants of Health   Financial Resource Strain:   . Difficulty of Paying Living Expenses: Not on file  Food Insecurity:   . Worried About Charity fundraiser in the Last Year: Not on file  . Ran Out of Food in the Last Year: Not on file  Transportation Needs:   . Lack of Transportation (Medical): Not on file  . Lack of Transportation (Non-Medical): Not on file  Physical Activity:   . Days of Exercise per Week: Not on file  . Minutes of Exercise per Session: Not on file  Stress:   . Feeling of Stress : Not on file  Social Connections:   . Frequency of Communication with Friends and Family: Not on file  . Frequency of Social Gatherings with Friends and Family: Not on file  . Attends Religious Services: Not on file  . Active Member of Clubs or Organizations: Not on file  . Attends Archivist Meetings: Not on file  . Marital Status: Not on file  Intimate Partner Violence:   . Fear of Current or Ex-Partner: Not on file  . Emotionally Abused: Not on file  . Physically Abused: Not on file  . Sexually Abused: Not on file    Current Outpatient Medications on File Prior to Visit  Medication Sig Dispense Refill  . Accu-Chek Softclix Lancets lancets 1 each by Other route 4 (four) times daily. Use to monitor glucose levels 4 times per day; E11.9 400 each 2  . albuterol (PROAIR HFA) 108 (90 BASE) MCG/ACT inhaler Inhale 2 puffs into the lungs every 6 (six) hours as needed for wheezing or shortness of breath.     Marland Kitchen albuterol (PROVENTIL) (2.5 MG/3ML) 0.083% nebulizer solution Take 2.5 mg by nebulization every 6 (six) hours as needed for wheezing.    Marland Kitchen ALPRAZolam (XANAX) 0.5 MG tablet Take 0.5 mg 2 (two) times daily as needed by mouth for anxiety or sleep.     Marland Kitchen aspirin EC 81 MG tablet Take 81 mg by mouth daily. Swallow whole.    . azaTHIOprine (IMURAN) 50 MG tablet Take 150 mg by mouth in the  morning and at bedtime.     . B-D UF III MINI PEN NEEDLES 31G X 5 MM MISC USE AS DIRECTED TWICE A DAY (Patient taking differently: 1 each by Other route in the morning and at bedtime. ) 100 each 2  . Belimumab (BENLYSTA) 200 MG/ML SOAJ Inject 200 mg into the skin once a week. Tuesdays.    Marland Kitchen BIDIL 20-37.5 MG tablet TAKE TWO (2) TABLETS BY MOUTH THREE TIMES DAILY  (Patient taking differently: Take 2 tablets by mouth 3 (three) times  daily. ) 180 tablet 3  . buPROPion (WELLBUTRIN XL) 300 MG 24 hr tablet Take 300 mg by mouth daily.    . cetaphil (CETAPHIL) lotion Apply topically 2 (two) times daily. (Patient taking differently: Apply 1 application topically 2 (two) times daily. Dry skin) 236 mL 0  . cetirizine (ZYRTEC) 10 MG tablet Take 10 mg by mouth daily as needed for allergies.     . clotrimazole-betamethasone (LOTRISONE) cream Apply 1 application topically 2 (two) times daily. (Patient taking differently: Apply 1 application topically daily as needed (rash). ) 45 g 3  . cycloSPORINE (RESTASIS) 0.05 % ophthalmic emulsion Place 1 drop into both eyes in the morning, at noon, and at bedtime.     . diclofenac Sodium (VOLTAREN) 1 % GEL Apply 2 g topically 4 (four) times daily. (Patient taking differently: Apply 2 g topically daily as needed (pain). ) 350 g 0  . EASY TOUCH PEN NEEDLES 32G X 4 MM MISC USE TO INJECT INSULIN TWICE DAILY (Patient taking differently: 1 each by Other route in the morning and at bedtime. ) 100 each 9  . famotidine (PEPCID) 20 MG tablet Take 20 mg by mouth 2 (two) times daily.    . fluticasone (FLONASE) 50 MCG/ACT nasal spray Place 2 sprays into both nostrils continuous as needed for allergies or rhinitis.    . hydroxychloroquine (PLAQUENIL) 200 MG tablet Take 200 mg by mouth 2 (two) times daily.     . Insulin Syringe-Needle U-100 (EASY TOUCH INSULIN SYRINGE) 31G X 5/16" 1 ML MISC USE AS DIRECTED THREE TIMES A DAY (Patient taking differently: 1 each by Other route in the morning, at  noon, and at bedtime. ) 100 each 10  . levothyroxine (SYNTHROID) 50 MCG tablet Take 1 tablet (50 mcg total) by mouth daily at 6 (six) AM.    . megestrol (MEGACE) 40 MG tablet Take 40 mg by mouth daily.  3  . metFORMIN (GLUCOPHAGE-XR) 500 MG 24 hr tablet Take 1,000 mg by mouth 2 (two) times daily.    . mometasone-formoterol (DULERA) 100-5 MCG/ACT AERO Inhale 2 puffs into the lungs 2 (two) times daily.    . Multiple Vitamin (MULTIVITAMIN WITH MINERALS) TABS tablet Take 1 tablet by mouth daily.    Marland Kitchen oxybutynin (DITROPAN-XL) 10 MG 24 hr tablet Take 15 mg by mouth daily.     . pravastatin (PRAVACHOL) 10 MG tablet Take 10 mg by mouth daily.    . predniSONE (DELTASONE) 1 MG tablet Take 5 mg by mouth daily.     . pregabalin (LYRICA) 75 MG capsule Take 75 mg 3 (three) times daily by mouth.     . sacubitril-valsartan (ENTRESTO) 97-103 MG Take 1 tablet by mouth 2 (two) times daily. 180 tablet 3  . spironolactone (ALDACTONE) 50 MG tablet Take 1 tablet (50 mg total) by mouth daily. 90 tablet 3  . topiramate (TOPAMAX) 25 MG tablet Take 75 mg by mouth at bedtime.    . torsemide (DEMADEX) 20 MG tablet Take 20 mg by mouth 2 (two) times daily.    . traMADol (ULTRAM) 50 MG tablet TAKE 1 TABLETS BY MOUTH EVERY 6 HOURS AS NEEDED FOR PAIN (Patient taking differently: Take 50 mg by mouth every 6 (six) hours as needed for moderate pain or severe pain. ) 20 tablet 0   No current facility-administered medications on file prior to visit.    Allergies  Allergen Reactions  . Metoclopramide Other (See Comments)    Developed restless leg, akathisia type limb  movements.   . Codeine Itching, Rash and Other (See Comments)  . Penicillins Itching    Tolerated Unasyn 02/01/2020  . Hydrocodone Rash  . Percocet [Oxycodone-Acetaminophen] Hives and Rash    Tolerates dilaudid    Family History  Problem Relation Age of Onset  . Diabetes Mother   . Asthma Mother   . Urticaria Mother   . Hypertension Mother   . Breast cancer  Maternal Aunt 32       currently 4  . Stomach cancer Maternal Uncle        dx 24s; deceased  . Prostate cancer Maternal Uncle        deceased 10s  . Cancer Maternal Aunt        unk. primary  . Cancer Maternal Aunt        unk. primary  . Ovarian cancer Maternal Aunt        deceased 74s  . Hypertension Other   . Stroke Other   . Arthritis Other   . Ovarian cancer Sister 75       maternal half-sister; RAD51C positive  . Cancer Paternal Aunt        unk. primary; deceased 92s  . Prostate cancer Paternal Uncle        deceased 76s  . Breast cancer Cousin        deceased 59s; daughter of mat uncle with stomach ca  . Breast cancer Maternal Aunt        deceased 58s  . Asthma Brother   . Asthma Brother   . Allergic rhinitis Neg Hx   . Angioedema Neg Hx     BP 126/82   Pulse (!) 108   Ht _0  (1.727 m)   Wt (!) 363 lb (164.7 kg)   LMP 03/24/2017   SpO2 97%   BMI 55.19 kg/m   Review of Systems     Objective:   Physical Exam VITAL SIGNS:  See vs page GENERAL: no distress Pulses: dorsalis pedis intact bilat.   MSK: no deformity of the feet CV: 1+ bilat leg edema Skin:  no ulcer on the feet, but the skin is dry.  normal color and temp on the feet.   Neuro: sensation is intact to touch on the feet.   Lab Results  Component Value Date   HGBA1C 12.5 (A) 04/21/2020       Assessment & Plan:  Type 2 DM, severe exacerbation, due to prob with medicaid  Patient Instructions  check your blood sugar 4 times a day: before the 3 meals, and at bedtime.  also check if you have symptoms of your blood sugar being too high or too low.  please keep a record of the readings and bring it to your next appointment here (or you can bring the meter itself).  You can write it on any piece of paper.  please call us sooner if your blood sugar goes below 70, or if you have a lot of readings over 200, especially if you have to increase the prednisone again.   Please change the insulin back to  Lantus, 200 units each morning, and:  continue the same other medications for now.  On this type of insulin schedule, you should eat meals on a regular schedule (especially lunch).  If a meal is missed or significantly delayed, your blood sugar could go low.   Please come back for a follow-up appointment in 2 months.

## 2020-04-21 NOTE — Telephone Encounter (Signed)
Pt says MEDICAID called her today & told pt to have provider send it again to [phone].  HUMILIN [facility]

## 2020-04-22 ENCOUNTER — Other Ambulatory Visit: Payer: Self-pay | Admitting: Endocrinology

## 2020-04-22 DIAGNOSIS — Z794 Long term (current) use of insulin: Secondary | ICD-10-CM

## 2020-04-22 DIAGNOSIS — E1143 Type 2 diabetes mellitus with diabetic autonomic (poly)neuropathy: Secondary | ICD-10-CM

## 2020-04-23 ENCOUNTER — Encounter: Payer: Self-pay | Admitting: Cardiology

## 2020-04-23 ENCOUNTER — Other Ambulatory Visit: Payer: Self-pay

## 2020-04-23 ENCOUNTER — Ambulatory Visit: Payer: Medicaid Other | Admitting: Cardiology

## 2020-04-23 VITALS — BP 153/99 | HR 99 | Resp 16 | Ht 68.0 in | Wt 342.0 lb

## 2020-04-23 DIAGNOSIS — I428 Other cardiomyopathies: Secondary | ICD-10-CM

## 2020-04-23 DIAGNOSIS — I5032 Chronic diastolic (congestive) heart failure: Secondary | ICD-10-CM

## 2020-04-23 DIAGNOSIS — I10 Essential (primary) hypertension: Secondary | ICD-10-CM

## 2020-04-23 MED ORDER — CARVEDILOL 25 MG PO TABS: 37.5000 mg | ORAL_TABLET | Freq: Two times a day (BID) | ORAL | 3 refills | Status: DC

## 2020-04-23 NOTE — Progress Notes (Signed)
Primary Physician:  Bartholome Bill, MD   Patient ID: [name], female    DOB: 01-19-1971, 49 y.o.   MRN: [id number]  CC:  Chief Complaint  Patient presents with  . Follow-up    2 month  . Congestive Heart Failure  . Hypertension   Subjective:   HPI: [name] is a 49 y.o. female  with Sjogren's syndrome, SLE, chronic systolic heart failure now with improved EF to 45-50% on echo in Nov 2019, morbid obesity, obesity hypoventilation syndrome, uncontrolled type 2 DM, hypertension, h/o CVA in 2014, OSA on CPAP and hepatic adenoma s/p embolization on 08/12/19.  The patient presents for two month follow up for CHF and hypertension. She is on guideline directed medical therapy for heart failure and tolerating well. She was admitted to the hospital 01/29/2020 - 02/07/2020 with COVID-19. She reports still having some residual dyspnea on exertion since her COVID infection but that this is slowly improving. Denies chest pain, palpitations, leg swelling, orthopnea.   Past Medical History:  Diagnosis Date  . Acute renal failure (HCC)     Intractable nausea vomiting secondary to diabetic gastroparesis causing dehydration and acute renal failure /notes 04/01/2013  . Anginal pain ([place])   . Anxiety   . Bell's palsy 08/09/2010  . Bicornuate uterus   . CAP (community acquired pneumonia) 10/2011   Archie Endo 11/08/2011  . Chest pain 01/13/2014  . CHF (congestive heart failure) ([place])   . Diabetic gastroparesis associated with type 2 diabetes mellitus ([place])    this is presumed diagnoses, not confirmed by any studies.   . Eczema   . Family history of malignant neoplasm of breast   . Family history of malignant neoplasm of ovary   . GERD (gastroesophageal reflux disease)   . High cholesterol   . Hypertension   . Liver lesion 08/13/2019  . Liver mass 2014   biopsied 03/2013 at [facility], not malignant.  is to undergo radiologic ablation of the mass in sept/October 2014.   . Lupus ([place])    . Migraines    "maybe a couple times/yr" (04/01/2013)  . Obesity   . Obstructive sleep apnea on CPAP 2011   Oxygen at nights  . SJOGREN'S SYNDROME 08/09/2010  . SLE (systemic lupus erythematosus) ([place])    Archie Endo 12/01/2010  . Stroke [facility]) 2010; 10/2012   "left side is still weak from it, never fully regained full strength; no additions from stroke 10/2012"    Past Surgical History:  Procedure Laterality Date  . BREAST CYST EXCISION Left 08/2005   epidermoid  . CARDIAC CATHETERIZATION  02/07/12  . ECTOPIC PREGNANCY SURGERY  1999  . ECTOPIC PREGNANCY SURGERY  1999  . ESOPHAGOGASTRODUODENOSCOPY N/A 02/26/2013   Procedure: ESOPHAGOGASTRODUODENOSCOPY (EGD);  Surgeon: Irene Shipper, MD;  Location: [facility] ENDOSCOPY;  Service: Endoscopy;  Laterality: N/A;  . ESOPHAGOGASTRODUODENOSCOPY N/A 03/02/2015   Procedure: ESOPHAGOGASTRODUODENOSCOPY (EGD);  Surgeon: Milus Banister, MD;  Location: [place];  Service: Endoscopy;  Laterality: N/A;  . HERNIA REPAIR    . LEFT AND RIGHT HEART CATHETERIZATION WITH CORONARY ANGIOGRAM N/A 02/07/2012   Procedure: LEFT AND RIGHT HEART CATHETERIZATION WITH CORONARY ANGIOGRAM;  Surgeon: Laverda Page, MD;  Location: [facility] CATH LAB;  Service: Cardiovascular;  Laterality: N/A;  . LIVER BIOPSY  03/2013   liver mass/medical hx noted above  . MUSCLE BIOPSY     for lupus/notes 12/01/2010  . UMBILICAL HERNIA REPAIR  1980's   Social History   Tobacco Use  .  Smoking status: Former Smoker    Packs/day: 0.50    Years: 3.00    Pack years: 1.50    Types: Cigarettes    Quit date: 06/02/1991    Years since quitting: 28.9  . Smokeless tobacco: Never Used  . Tobacco comment: quit 28 yrs ago  Substance Use Topics  . Alcohol use: No    Alcohol/week: 0.0 standard drinks   Marital Status: Single   Review of Systems  Cardiovascular: Positive for dyspnea on exertion. Negative for chest pain, leg swelling, orthopnea, palpitations and paroxysmal nocturnal dyspnea.  Musculoskeletal:  Positive for arthritis and back pain.  Gastrointestinal: Negative for melena.   Objective:  Blood pressure (!) 153/99, pulse 99, resp. rate 16, height 5\' 8"  (1.727 m), weight (!) 342 lb (155.1 kg), last menstrual period 03/24/2017, SpO2 93 %. Body mass index is 52 kg/m.   Vitals with BMI 04/23/2020 04/21/2020 03/16/2020  Height 5\' 8"  5\' 8"  5\' 9"   Weight 342 lbs 363 lbs 359 lbs  BMI 52.01 26.83 41.96  Systolic 222 979 892  Diastolic 99 82 90  Pulse 99 108 110   Physical Exam Constitutional:      Appearance: She is well-developed.     Comments: Morbidly obese  Neck:     Vascular: No JVD.     Comments: Short neck and difficult to evaluate JVP Cardiovascular:     Rate and Rhythm: Regular rhythm. Tachycardia present.     Pulses: Intact distal pulses.          Carotid pulses are 2+ on the right side and 2+ on the left side.      Dorsalis pedis pulses are 2+ on the right side and 2+ on the left side.       Posterior tibial pulses are 2+ on the right side and 2+ on the left side.     Heart sounds: Normal heart sounds. No murmur heard.  No gallop.      Comments: Femoral and popliteal pulse difficult to feel due to patient's body habitus.  Pulmonary:     Effort: Pulmonary effort is normal.     Breath sounds: Normal breath sounds.  Abdominal:     General: Bowel sounds are normal.     Palpations: Abdomen is soft.     Comments: Obese. Pannus present  Musculoskeletal:     Cervical back: Neck supple.    Laboratory examination:   CMP Latest Ref Rng & Units 02/07/2020 02/06/2020 02/05/2020  Glucose 70 - 99 mg/dL 163(H) 228(H) 208(H)  BUN 6 - 20 mg/dL 19 17 18   Creatinine 0.44 - 1.00 mg/dL 1.10(H) 1.11(H) 0.99  Sodium 135 - 145 mmol/L 140 138 138  Potassium 3.5 - 5.1 mmol/L 3.3(L) 3.7 4.0  Chloride 98 - 111 mmol/L 105 107 108  CO2 22 - 32 mmol/L 26 23 22   Calcium 8.9 - 10.3 mg/dL 8.9 8.6(L) 8.6(L)  Total Protein 6.5 - 8.1 g/dL - - -  Total Bilirubin 0.3 - 1.2 mg/dL - - -  Alkaline Phos 38  - 126 U/L - - -  AST 15 - 41 U/L - - -  ALT 0 - 44 U/L - - -   CBC Latest Ref Rng & Units 02/05/2020 02/04/2020 02/03/2020  WBC 4.0 - 10.5 K/uL 8.4 6.6 5.3  Hemoglobin 12.0 - 15.0 g/dL 10.5(L) 10.7(L) 10.7(L)  Hematocrit 36 - 46 % 35.1(L) 34.1(L) 35.1(L)  Platelets 150 - 400 K/uL 255 243 262   Lipid Panel No results for input(s):  CHOL, TRIG, LDLCALC, VLDL, HDL, CHOLHDL, LDLDIRECT in the last 8760 hours.  HEMOGLOBIN A1C Lab Results  Component Value Date   HGBA1C 12.5 (A) 04/21/2020   MPG 254.65 01/30/2020   TSH Recent Labs    08/24/19 0538  TSH 7.630*    Medications and allergies   Allergies  Allergen Reactions  . Metoclopramide Other (See Comments)    Developed restless leg, akathisia type limb movements.   . Codeine Itching, Rash and Other (See Comments)  . Penicillins Itching    Tolerated Unasyn 02/01/2020  . Hydrocodone Rash  . Percocet [Oxycodone-Acetaminophen] Hives and Rash    Tolerates dilaudid   Outpatient Medications Prior to Visit  Medication Sig Dispense Refill  . ACCU-CHEK AVIVA PLUS test strip USE FOUR TIMES A DAY AS DIRECTED 100 strip 1  . Accu-Chek Softclix Lancets lancets 1 each by Other route 4 (four) times daily. Use to monitor glucose levels 4 times per day; E11.9 400 each 2  . albuterol (PROAIR HFA) 108 (90 BASE) MCG/ACT inhaler Inhale 2 puffs into the lungs every 6 (six) hours as needed for wheezing or shortness of breath.     Marland Kitchen albuterol (PROVENTIL) (2.5 MG/3ML) 0.083% nebulizer solution Take 2.5 mg by nebulization every 6 (six) hours as needed for wheezing.    Marland Kitchen ALPRAZolam (XANAX) 0.5 MG tablet Take 0.5 mg 2 (two) times daily as needed by mouth for anxiety or sleep.     Marland Kitchen aspirin EC 81 MG tablet Take 81 mg by mouth daily. Swallow whole.    . azaTHIOprine (IMURAN) 50 MG tablet Take 150 mg by mouth in the morning and at bedtime.     . B-D UF III MINI PEN NEEDLES 31G X 5 MM MISC USE AS DIRECTED TWICE A DAY (Patient taking differently: 1 each by Other route  in the morning and at bedtime. ) 100 each 2  . Belimumab (BENLYSTA) 200 MG/ML SOAJ Inject 200 mg into the skin once a week. Tuesdays.    Marland Kitchen BIDIL 20-37.5 MG tablet TAKE TWO (2) TABLETS BY MOUTH THREE TIMES DAILY  (Patient taking differently: Take 2 tablets by mouth 3 (three) times daily. ) 180 tablet 3  . buPROPion (WELLBUTRIN XL) 300 MG 24 hr tablet Take 300 mg by mouth daily.    . cetaphil (CETAPHIL) lotion Apply topically 2 (two) times daily. (Patient taking differently: Apply 1 application topically 2 (two) times daily. Dry skin) 236 mL 0  . cetirizine (ZYRTEC) 10 MG tablet Take 10 mg by mouth daily as needed for allergies.     . clotrimazole-betamethasone (LOTRISONE) cream Apply 1 application topically 2 (two) times daily. (Patient taking differently: Apply 1 application topically daily as needed (rash). ) 45 g 3  . cycloSPORINE (RESTASIS) 0.05 % ophthalmic emulsion Place 1 drop into both eyes in the morning, at noon, and at bedtime.     . diclofenac Sodium (VOLTAREN) 1 % GEL Apply 2 g topically 4 (four) times daily. (Patient taking differently: Apply 2 g topically daily as needed (pain). ) 350 g 0  . EASY TOUCH PEN NEEDLES 32G X 4 MM MISC USE TO INJECT INSULIN TWICE DAILY (Patient taking differently: 1 each by Other route in the morning and at bedtime. ) 100 each 9  . exenatide (BYETTA 10 MCG PEN) 10 MCG/0.04ML SOPN injection USE 1 INJECTION UNDER THE SKIN 2 TIMES DAILY WITH A MEAL 1 mL 1  . famotidine (PEPCID) 20 MG tablet Take 20 mg by mouth 2 (two) times daily.    Marland Kitchen  fluticasone (FLONASE) 50 MCG/ACT nasal spray Place 2 sprays into both nostrils continuous as needed for allergies or rhinitis.    . hydroxychloroquine (PLAQUENIL) 200 MG tablet Take 200 mg by mouth 2 (two) times daily.     . insulin glargine (LANTUS SOLOSTAR) 100 UNIT/ML Solostar Pen Inject 200 Units into the skin every morning. And pen needles 1/day 210 mL PRN  . Insulin Syringe-Needle U-100 (EASY TOUCH INSULIN SYRINGE) 31G X  5/16" 1 ML MISC USE AS DIRECTED THREE TIMES A DAY (Patient taking differently: 1 each by Other route in the morning, at noon, and at bedtime. ) 100 each 10  . levothyroxine (SYNTHROID) 50 MCG tablet Take 1 tablet (50 mcg total) by mouth daily at 6 (six) AM.    . megestrol (MEGACE) 40 MG tablet Take 40 mg by mouth daily.  3  . metFORMIN (GLUCOPHAGE-XR) 500 MG 24 hr tablet Take 1,000 mg by mouth 2 (two) times daily.    . mometasone-formoterol (DULERA) 100-5 MCG/ACT AERO Inhale 2 puffs into the lungs 2 (two) times daily.    . Multiple Vitamin (MULTIVITAMIN WITH MINERALS) TABS tablet Take 1 tablet by mouth daily.    Marland Kitchen oxybutynin (DITROPAN-XL) 10 MG 24 hr tablet Take 15 mg by mouth daily.     . pravastatin (PRAVACHOL) 10 MG tablet Take 10 mg by mouth daily.    . predniSONE (DELTASONE) 1 MG tablet Take 5 mg by mouth daily.     . pregabalin (LYRICA) 75 MG capsule Take 75 mg 3 (three) times daily by mouth.     . QUEtiapine (SEROQUEL) 100 MG tablet Take 100 mg by mouth at bedtime.    . sacubitril-valsartan (ENTRESTO) 97-103 MG Take 1 tablet by mouth 2 (two) times daily. 180 tablet 3  . spironolactone (ALDACTONE) 50 MG tablet Take 1 tablet (50 mg total) by mouth daily. 90 tablet 3  . topiramate (TOPAMAX) 25 MG tablet Take 75 mg by mouth at bedtime.    . torsemide (DEMADEX) 20 MG tablet Take 20 mg by mouth 2 (two) times daily.    . traMADol (ULTRAM) 50 MG tablet TAKE 1 TABLETS BY MOUTH EVERY 6 HOURS AS NEEDED FOR PAIN (Patient taking differently: Take 50 mg by mouth every 6 (six) hours as needed for moderate pain or severe pain. ) 20 tablet 0  . carvedilol (COREG) 25 MG tablet Take 1 tablet (25 mg total) by mouth 2 (two) times daily with a meal. 60 tablet 0  . doxepin (SINEQUAN) 50 MG capsule Take 50-100 mg by mouth at bedtime as needed (insomnia). (Patient not taking: Reported on 04/23/2020)    . etonogestrel-ethinyl estradiol (NUVARING) 0.12-0.015 MG/24HR vaginal ring Place 1 each every 28 (twenty-eight)  days vaginally. Insert vaginally and leave in place for 3 consecutive weeks, then remove for 1 week. (Patient not taking: Reported on 04/23/2020)    . sucralfate (CARAFATE) 1 GM/10ML suspension Take 1 g by mouth 2 (two) times daily. (Patient not taking: Reported on 04/23/2020)     No facility-administered medications prior to visit.   Radiology:    CXR 02/01/2020: New patchy bilateral airspace disease most consistent with pneumonia.  Chronic cardiomegaly and vascular congestion.  Cardiac Studies:   Coronary angiogram 02/07/2012: Normal coronary arteries and LVEF. Left dominant circulation.  Carotid duplex 03/06/2014: The vertebral arteries appear patent with antegrade flow. Findings consistent with 1-39 percent stenosis involving theright internal carotid artery and the left internal carotidartery.  Vascular Ultrasound Lower Extremity Venous 03/26/2018: Right: There is no  evidence of deep vein thrombosis in the lower extremity. No cystic structure found in the popliteal fossa. Left: No evidence of common femoral vein obstruction.  Echocardiogram 02/02/2020 Left ventricular ejection fraction, by estimation, is 40 to 45%. The left ventricle has mildly decreased function. The left ventricle demonstrates global hypokinesis. Left ventricular diastolic function could not be evaluated.  Right ventricular systolic function is normal. The right ventricular size is mildly enlarged. Tricuspid regurgitation signal is inadequate for assessing PA pressure.  The mitral valve is normal in structure. Trivial mitral valve regurgitation.  The aortic valve is normal in structure. Aortic valve regurgitation is not visualized. No aortic stenosis is present.   EKG   EKG 01/21/2020: Normal sinus rhythm with rate of 100 bpm, left axis deviation, poor R wave progression, cannot exclude anterolateral infarct old.  Low-voltage chest leads, consider pulmonary disease pattern.  Nonspecific T  abnormality.  Assessment:     ICD-10-CM   1. Non-ischemic cardiomyopathy (HCC)  I42.8 carvedilol (COREG) 25 MG tablet  2. Essential hypertension  I10 carvedilol (COREG) 25 MG tablet  3. Morbid obesity with BMI of 50.0-59.9, adult ([place])  E66.01    Z68.43   4. Chronic diastolic congestive heart failure (HCC)  I50.32     Meds ordered this encounter  Medications  . carvedilol (COREG) 25 MG tablet    Sig: Take 1.5 tablets (37.5 mg total) by mouth 2 (two) times daily with a meal.    Dispense:  270 tablet    Refill:  3   Medications Discontinued During This Encounter  Medication Reason  . carvedilol (COREG) 25 MG tablet Reorder   Recommendations:   [name]  is a 49 y.o. female  with Sjogren's syndrome, SLE, chronic systolic heart failure now with improved EF to 45-50% on echo in Nov 2019, morbid obesity, obesity hypoventilation syndrome, uncontrolled type 2 DM, hypertension, h/o CVA in 2014, OSA on CPAP and hepatic  adenoma s/p embolization on 08/12/19.   The patient presents for 2 month follow up for congestive heart failure and hypertension. She was admitted for COVID-19 infection 01/29/2020 - 02/02/2020. She continues to have residual dyspnea on exertion, but feels this is slowly improving. I have reviewed her hospital records. Her echocardiogram was repeated while hospitalized, LVEF estimated at 40-45%, mildly decreased from previous study. She is presently on guideline directed medical therapy for heart failure and tolerating the medications without any side effects. However her blood pressure is uncontrolled today. I will increase her dose of carvedilol to 37.5 mg which may also improve her heart rate as she is mildly tachycardic today.  I also discussed weight loss with the patient. She has lost weight since her hospitalization. She is encouraged to re-join the bariatric weight loss program she was previously enrolled in at [facility]. I would like to see her back in 3  months for follow up.  Blair Heys, PA Student 04/23/20 5:48 PM   Patient seen and examined in conjunction with Blair Heys, PA second year student at [employer].  Time spent is in direct patient face to face encounter not including the teaching and training involved.    Adrian Prows, MD, [facility] 04/23/2020, 5:48 PM Office: [phone]

## 2020-04-27 ENCOUNTER — Telehealth: Payer: Self-pay | Admitting: Endocrinology

## 2020-04-27 DIAGNOSIS — Z794 Long term (current) use of insulin: Secondary | ICD-10-CM

## 2020-04-27 DIAGNOSIS — E1143 Type 2 diabetes mellitus with diabetic autonomic (poly)neuropathy: Secondary | ICD-10-CM

## 2020-04-27 MED ORDER — GLUCOSE BLOOD VI STRP: ORAL_STRIP | 12 refills | Status: DC

## 2020-04-27 MED ORDER — ONETOUCH VERIO W/DEVICE KIT: PACK | 1 refills | Status: DC

## 2020-04-27 MED ORDER — ONETOUCH ULTRASOFT LANCETS MISC: 12 refills | Status: DC

## 2020-04-27 MED ORDER — ONETOUCH VERIO VI STRP: ORAL_STRIP | 12 refills | Status: DC

## 2020-04-27 NOTE — Telephone Encounter (Signed)
Refills sent

## 2020-04-27 NOTE — Telephone Encounter (Signed)
Medication Refill Request  . Did you call your pharmacy and request this refill first?  . If patient has not contacted pharmacy first, instruct them to do so for future refills.  . Remind them that contacting the pharmacy for their refill is the quickest method to get the refill.  . Refill policy also stated that it will take anywhere between 24-72 hours to receive the refill.    Name of medication? Byetta, One Touch meter kit, One Touch supplies  Is this a 90 day supply? Unknown  [facility] [address]  . Is the request for diabetes test strips? Yes . If yes, what brand? One touch

## 2020-04-27 NOTE — Telephone Encounter (Signed)
BYETTA was not sent to pharmacy. All other were sent today.  Please send Byetta to [place]

## 2020-04-28 MED ORDER — BYETTA 10 MCG PEN 10 MCG/0.04ML ~~LOC~~ SOPN: PEN_INJECTOR | SUBCUTANEOUS | 1 refills | Status: DC

## 2020-04-28 NOTE — Addendum Note (Signed)
Addended by: Amado Coe on: 04/28/2020 08:00 AM   Modules accepted: Orders

## 2020-04-28 NOTE — Telephone Encounter (Signed)
Byetta rx updated and sent to corrected pharmacy.

## 2020-05-13 ENCOUNTER — Telehealth: Payer: Self-pay | Admitting: Endocrinology

## 2020-05-13 NOTE — Telephone Encounter (Signed)
Will route to MD to advise

## 2020-05-13 NOTE — Telephone Encounter (Signed)
Patient advised of lantus increase.

## 2020-05-13 NOTE — Telephone Encounter (Signed)
Please increase the Lantus to 220 units each morning

## 2020-05-13 NOTE — Telephone Encounter (Signed)
Patient called to advise that the Lantus is not working well at this time.  Her blood sugars are consistently over 200 with her highest being 378.  Call back number [phone]

## 2020-05-14 ENCOUNTER — Other Ambulatory Visit: Payer: Self-pay | Admitting: Internal Medicine

## 2020-05-14 MED ORDER — INSULIN LISPRO (1 UNIT DIAL) 100 UNIT/ML (KWIKPEN): 10.0000 [IU] | PEN_INJECTOR | Freq: Three times a day (TID) | SUBCUTANEOUS | 11 refills | Status: DC

## 2020-05-14 NOTE — Telephone Encounter (Signed)
Patient called stating she has actually been taking 220 units already of the Lantus - she thought that's what she was supposed to be taking. She would like to know now if he wants her to continue taking that or if she should increase?  She said if she does need to increase to more than 220 units, could we please [facility] because she will run out soon if she needs to be taking more.  [id number] [place], [address] [place] [address]  Phone:  [phone] Fax:  [phone]

## 2020-05-14 NOTE — Telephone Encounter (Signed)
Called patient, advised of message from MD.   She will start new medication tonight and see how the sugars go.  Will monitor and call back with any concerns.

## 2020-05-14 NOTE — Telephone Encounter (Signed)
Will route to MD to advise-  Patient has been already taking the 220 units of Lantus- she is wondering if she should increase further.   Advised patient I would send to MD and call back with response.   11:30 today 302, patient dry mouth, feeling dehydrated, urinating a lot- will reach out to another provider to advise on what to do since Dr.Ellison is out this afternoon. But will route message to him to make him aware.     Dr.Gherghe can you take a look at this and give recommendations to see if we can get her sugars down, thank you!

## 2020-05-14 NOTE — Telephone Encounter (Signed)
[name], I sent the prescription for Humalog to her pharmacy.  This is taken 15 minutes before every meal.  I would start with 10 units before meals but may need to increase to 20 units or even higher.  If sugars start to improve, she may need to back off Lantus as she is getting up on the Humalog dose. Ty!C

## 2020-05-15 ENCOUNTER — Telehealth: Payer: Self-pay | Admitting: Internal Medicine

## 2020-05-15 NOTE — Telephone Encounter (Signed)
Called patient, advised that I did not see one yet for PA for Humalog, and would be on the lookout for it.   Patient verbalized understanding.

## 2020-05-15 NOTE — Telephone Encounter (Signed)
Patient called asking if we received fax from pharmacy about having to do a PA for her humalog?

## 2020-05-20 ENCOUNTER — Other Ambulatory Visit: Payer: Self-pay | Admitting: Physical Medicine and Rehabilitation

## 2020-05-21 ENCOUNTER — Telehealth: Payer: Self-pay | Admitting: Endocrinology

## 2020-05-21 MED ORDER — INSULIN NPH (HUMAN) (ISOPHANE) 100 UNIT/ML ~~LOC~~ SUSP
220.0000 [IU] | SUBCUTANEOUS | 11 refills | Status: DC
Start: 2020-05-21 — End: 2020-06-01

## 2020-05-21 NOTE — Telephone Encounter (Signed)
I have sent a prescription to your pharmacy, for the NPH.  This replaced the lantus and Novolog

## 2020-05-21 NOTE — Telephone Encounter (Signed)
Patient called stating her insurance finally responded saying they would cover humulin N and she asked if she could go back to taking this instead of the Lantus? Lantus has made her sugars increase and she wanted to know if shes able to go back on humulin, should her dose be more until her sugars can get back to normal?  Please send humulin N Rx to:  [id number] [place], [place] [place] [address]  Phone:  [phone] Fax:  [phone]

## 2020-05-21 NOTE — Telephone Encounter (Signed)
Informed patient prescription was sent to pharmacy.

## 2020-05-28 ENCOUNTER — Other Ambulatory Visit: Payer: Self-pay | Admitting: Endocrinology

## 2020-05-28 DIAGNOSIS — E1143 Type 2 diabetes mellitus with diabetic autonomic (poly)neuropathy: Secondary | ICD-10-CM

## 2020-05-28 DIAGNOSIS — Z794 Long term (current) use of insulin: Secondary | ICD-10-CM

## 2020-06-01 NOTE — Telephone Encounter (Signed)
Pt called back to say that a prescription was sent over for the viles but she needed it for the [facility] for the Humulin 100

## 2020-06-01 NOTE — Addendum Note (Signed)
Addended by: Amado Coe on: 06/01/2020 03:11 PM   Modules accepted: Orders

## 2020-06-01 NOTE — Telephone Encounter (Signed)
OK, I need to verify: When you say Humulin U-100 in a pen, do you mean NPH?

## 2020-06-02 MED ORDER — HUMULIN N KWIKPEN 100 UNIT/ML ~~LOC~~ SUPN: PEN_INJECTOR | SUBCUTANEOUS | 11 refills | Status: DC

## 2020-06-02 NOTE — Telephone Encounter (Signed)
Ok, I have sent a prescription to your pharmacy 

## 2020-06-02 NOTE — Addendum Note (Signed)
Addended by: Amado Coe on: 06/02/2020 08:19 AM   Modules accepted: Orders

## 2020-06-02 NOTE — Addendum Note (Signed)
Addended by: Renato Shin on: 06/02/2020 08:37 AM   Modules accepted: Orders

## 2020-06-02 NOTE — Telephone Encounter (Signed)
Humulin N NPH quickpen 220units daily

## 2020-06-09 ENCOUNTER — Telehealth: Payer: Self-pay | Admitting: Physician Assistant

## 2020-06-09 ENCOUNTER — Other Ambulatory Visit: Payer: Self-pay | Admitting: Physician Assistant

## 2020-06-09 MED ORDER — TRAMADOL HCL 50 MG PO TABS: 50.0000 mg | ORAL_TABLET | Freq: Four times a day (QID) | ORAL | 0 refills | Status: DC | PRN

## 2020-06-09 NOTE — Telephone Encounter (Signed)
Will fill once but will need an appointment for further refills

## 2020-06-09 NOTE — Telephone Encounter (Signed)
Pt informed and stated understanding

## 2020-06-09 NOTE — Telephone Encounter (Signed)
Patient called requesting a refill of Tramadol. Patient states she hasnt been here since 6/20 but was told by PA Carlis Abbott she could call for refill when needed. [facility] [address] Patient phone number is [phone].

## 2020-06-22 ENCOUNTER — Ambulatory Visit: Payer: Medicaid Other | Admitting: Endocrinology

## 2020-06-22 DIAGNOSIS — E1143 Type 2 diabetes mellitus with diabetic autonomic (poly)neuropathy: Secondary | ICD-10-CM

## 2020-07-02 ENCOUNTER — Other Ambulatory Visit: Payer: Self-pay | Admitting: Endocrinology

## 2020-07-02 DIAGNOSIS — E1143 Type 2 diabetes mellitus with diabetic autonomic (poly)neuropathy: Secondary | ICD-10-CM

## 2020-07-02 DIAGNOSIS — Z794 Long term (current) use of insulin: Secondary | ICD-10-CM

## 2020-07-07 ENCOUNTER — Ambulatory Visit: Payer: Medicaid Other | Admitting: Endocrinology

## 2020-07-21 ENCOUNTER — Ambulatory Visit: Payer: Medicaid Other | Admitting: Cardiology

## 2020-07-23 ENCOUNTER — Ambulatory Visit: Payer: Medicaid Other | Admitting: Cardiology

## 2020-08-13 ENCOUNTER — Ambulatory Visit: Payer: Medicaid Other | Admitting: Cardiology

## 2020-09-11 ENCOUNTER — Other Ambulatory Visit: Payer: Self-pay | Admitting: Endocrinology

## 2020-09-11 DIAGNOSIS — E1143 Type 2 diabetes mellitus with diabetic autonomic (poly)neuropathy: Secondary | ICD-10-CM

## 2020-09-11 DIAGNOSIS — Z794 Long term (current) use of insulin: Secondary | ICD-10-CM

## 2020-09-28 DIAGNOSIS — I509 Heart failure, unspecified: Secondary | ICD-10-CM | POA: Insufficient documentation

## 2020-11-09 ENCOUNTER — Other Ambulatory Visit: Payer: Self-pay | Admitting: Cardiology

## 2020-12-08 ENCOUNTER — Other Ambulatory Visit: Payer: Self-pay | Admitting: Cardiology

## 2020-12-08 DIAGNOSIS — I5032 Chronic diastolic (congestive) heart failure: Secondary | ICD-10-CM

## 2020-12-15 ENCOUNTER — Telehealth: Payer: Self-pay | Admitting: Endocrinology

## 2020-12-15 NOTE — Telephone Encounter (Signed)
The drug store is stating that BYETTA 10 MCG PEN 10 MCG/0.04ML SOPN injection is on back order and pharmacy does not know when they will receive it. Pt is wondering if Dr.Ellison can write a new prescription for a different kind of medication   [id number] [place] [place].

## 2020-12-16 NOTE — Telephone Encounter (Signed)
1.  Please schedule f/u appt 2.  Then I'll send rx for different med, pending that appt.

## 2020-12-17 NOTE — Telephone Encounter (Signed)
Patient is scheduled for appointment on 12/22/20 at 11:15 am

## 2020-12-21 ENCOUNTER — Ambulatory Visit: Payer: Medicaid Other | Admitting: Cardiology

## 2020-12-22 ENCOUNTER — Other Ambulatory Visit: Payer: Self-pay

## 2020-12-22 ENCOUNTER — Ambulatory Visit (INDEPENDENT_AMBULATORY_CARE_PROVIDER_SITE_OTHER): Payer: Medicaid Other | Admitting: Endocrinology

## 2020-12-22 VITALS — BP 126/78 | HR 98 | Ht 68.0 in | Wt 369.0 lb

## 2020-12-22 DIAGNOSIS — Z794 Long term (current) use of insulin: Secondary | ICD-10-CM | POA: Diagnosis not present

## 2020-12-22 DIAGNOSIS — E1143 Type 2 diabetes mellitus with diabetic autonomic (poly)neuropathy: Secondary | ICD-10-CM | POA: Diagnosis not present

## 2020-12-22 LAB — POCT GLYCOSYLATED HEMOGLOBIN (HGB A1C): Hemoglobin A1C: 13.4 % — AB (ref 4.0–5.6)

## 2020-12-22 MED ORDER — VICTOZA 18 MG/3ML ~~LOC~~ SOPN: 1.8000 mg | PEN_INJECTOR | SUBCUTANEOUS | 3 refills | Status: DC

## 2020-12-22 MED ORDER — HUMULIN N KWIKPEN 100 UNIT/ML ~~LOC~~ SUPN: PEN_INJECTOR | SUBCUTANEOUS | 11 refills | Status: DC

## 2020-12-22 NOTE — Progress Notes (Signed)
Subjective:    Patient ID: [name], female    DOB: 1970/09/29, 50 y.o.   MRN: [id number]  HPI Pt returns for f/u of diabetes mellitus: DM type: Insulin-requiring type 2 Dx'ed: 5035 Complications: polyneuropathy and gastroparesis.  Therapy: insulin (since 2014), Byetta, and metformin.   GDM: never.  DKA: never.  Severe hypoglycemia: never.  Pancreatitis: never.  Other: she is too ill to undergo weight-loss surgery; she takes qd insulin, after poor results with multiple daily injections; she takes NPH, due to pattern of cbg's.   Interval history: Pt says she never misses insulin, but pharmacy cannot obtain Byetta. pt states she feels well in general.  She says cbg varies from 156-180.   Past Medical History:  Diagnosis Date  . Acute renal failure (HCC)     Intractable nausea vomiting secondary to diabetic gastroparesis causing dehydration and acute renal failure /notes 04/01/2013  . Anginal pain ([place])   . Anxiety   . Bell's palsy 08/09/2010  . Bicornuate uterus   . CAP (community acquired pneumonia) 10/2011   Archie Endo 11/08/2011  . Chest pain 01/13/2014  . CHF (congestive heart failure) ([place])   . Diabetic gastroparesis associated with type 2 diabetes mellitus ([place])    this is presumed diagnoses, not confirmed by any studies.   . Eczema   . Family history of malignant neoplasm of breast   . Family history of malignant neoplasm of ovary   . GERD (gastroesophageal reflux disease)   . High cholesterol   . Hypertension   . Liver lesion 08/13/2019  . Liver mass 2014   biopsied 03/2013 at [facility], not malignant.  is to undergo radiologic ablation of the mass in sept/October 2014.   . Lupus ([place])   . Migraines    "maybe a couple times/yr" (04/01/2013)  . Obesity   . Obstructive sleep apnea on CPAP 2011   Oxygen at nights  . SJOGREN'S SYNDROME 08/09/2010  . SLE (systemic lupus erythematosus) ([place])    Archie Endo 12/01/2010  . Stroke [facility]) 2010; 10/2012   "left side is still weak from  it, never fully regained full strength; no additions from stroke 10/2012"    Past Surgical History:  Procedure Laterality Date  . BREAST CYST EXCISION Left 08/2005   epidermoid  . CARDIAC CATHETERIZATION  02/07/12  . ECTOPIC PREGNANCY SURGERY  1999  . ECTOPIC PREGNANCY SURGERY  1999  . ESOPHAGOGASTRODUODENOSCOPY N/A 02/26/2013   Procedure: ESOPHAGOGASTRODUODENOSCOPY (EGD);  Surgeon: Irene Shipper, MD;  Location: [facility] ENDOSCOPY;  Service: Endoscopy;  Laterality: N/A;  . ESOPHAGOGASTRODUODENOSCOPY N/A 03/02/2015   Procedure: ESOPHAGOGASTRODUODENOSCOPY (EGD);  Surgeon: Milus Banister, MD;  Location: [place];  Service: Endoscopy;  Laterality: N/A;  . HERNIA REPAIR    . LEFT AND RIGHT HEART CATHETERIZATION WITH CORONARY ANGIOGRAM N/A 02/07/2012   Procedure: LEFT AND RIGHT HEART CATHETERIZATION WITH CORONARY ANGIOGRAM;  Surgeon: Laverda Page, MD;  Location: [facility] CATH LAB;  Service: Cardiovascular;  Laterality: N/A;  . LIVER BIOPSY  03/2013   liver mass/medical hx noted above  . MUSCLE BIOPSY     for lupus/notes 12/01/2010  . UMBILICAL HERNIA REPAIR  1980's    Social History   Socioeconomic History  . Marital status: Single    Spouse name: Not on file  . Number of children: 1  . Years of education: Not on file  . Highest education level: Not on file  Occupational History    Employer: UNEMPLOYED    Comment: student  Tobacco Use  .  Smoking status: Former Smoker    Packs/day: 0.50    Years: 3.00    Pack years: 1.50    Types: Cigarettes    Quit date: 06/02/1991    Years since quitting: 29.5  . Smokeless tobacco: Never Used  . Tobacco comment: quit 28 yrs ago  Vaping Use  . Vaping Use: Never used  Substance and Sexual Activity  . Alcohol use: No    Alcohol/week: 0.0 standard drinks  . Drug use: No  . Sexual activity: Yes    Partners: Male    Birth control/protection: None  Other Topics Concern  . Not on file  Social History Narrative   Regular exercise-yes   Social  Determinants of Health   Financial Resource Strain: Not on file  Food Insecurity: Not on file  Transportation Needs: Not on file  Physical Activity: Not on file  Stress: Not on file  Social Connections: Not on file  Intimate Partner Violence: Not on file    Current Outpatient Medications on File Prior to Visit  Medication Sig Dispense Refill  . albuterol (PROVENTIL) (2.5 MG/3ML) 0.083% nebulizer solution Take 2.5 mg by nebulization every 6 (six) hours as needed for wheezing.    Marland Kitchen albuterol (VENTOLIN HFA) 108 (90 Base) MCG/ACT inhaler Inhale 2 puffs into the lungs every 6 (six) hours as needed for wheezing or shortness of breath.    . ALPRAZolam (XANAX) 0.5 MG tablet Take 0.5 mg 2 (two) times daily as needed by mouth for anxiety or sleep.     Marland Kitchen aspirin EC 81 MG tablet Take 81 mg by mouth daily. Swallow whole.    . azaTHIOprine (IMURAN) 50 MG tablet Take 150 mg by mouth in the morning and at bedtime.     . B-D UF III MINI PEN NEEDLES 31G X 5 MM MISC USE AS DIRECTED TWICE A DAY (Patient taking differently: 1 each by Other route in the morning and at bedtime.) 100 each 2  . Belimumab (BENLYSTA) 200 MG/ML SOAJ Inject 200 mg into the skin once a week. Tuesdays.    Marland Kitchen BIDIL 20-37.5 MG tablet TAKE TWO (2) TABLETS BY MOUTH THREE TIMES DAILY 180 tablet 0  . Blood Glucose Monitoring Suppl (ONETOUCH VERIO) w/Device KIT Test 4 times daily.  DX E11.9 1 kit 1  . buPROPion (WELLBUTRIN XL) 300 MG 24 hr tablet Take 300 mg by mouth daily.    Marland Kitchen BYETTA 10 MCG PEN 10 MCG/0.04ML SOPN injection USE 1 INJECTION UNDER THE SKIN 2 TIMES DAILY WITH A MEAL 2.4 mL 0  . carvedilol (COREG) 25 MG tablet Take 1.5 tablets (37.5 mg total) by mouth 2 (two) times daily with a meal. 270 tablet 3  . cetaphil (CETAPHIL) lotion Apply topically 2 (two) times daily. (Patient taking differently: Apply 1 application topically 2 (two) times daily. Dry skin) 236 mL 0  . cetirizine (ZYRTEC) 10 MG tablet Take 10 mg by mouth daily as needed  for allergies.     . clotrimazole-betamethasone (LOTRISONE) cream Apply 1 application topically 2 (two) times daily. (Patient taking differently: Apply 1 application topically daily as needed (rash).) 45 g 3  . cycloSPORINE (RESTASIS) 0.05 % ophthalmic emulsion Place 1 drop into both eyes in the morning, at noon, and at bedtime.     . diclofenac Sodium (VOLTAREN) 1 % GEL Apply 2 g topically 4 (four) times daily. (Patient taking differently: Apply 2 g topically daily as needed (pain).) 350 g 0  . EASY TOUCH PEN NEEDLES 32G X 4  MM MISC USE TO INJECT INSULIN TWICE DAILY (Patient taking differently: 1 each by Other route in the morning and at bedtime.) 100 each 9  . famotidine (PEPCID) 20 MG tablet Take 20 mg by mouth 2 (two) times daily.    . fluticasone (FLONASE) 50 MCG/ACT nasal spray Place 2 sprays into both nostrils continuous as needed for allergies or rhinitis.    Marland Kitchen glucose blood (ONETOUCH VERIO) test strip Test 4 times daily DX E11.9 100 each 12  . glucose blood test strip Test 4 times daily DX E11.9 100 each 12  . hydroxychloroquine (PLAQUENIL) 200 MG tablet Take 200 mg by mouth 2 (two) times daily.    . Insulin Syringe-Needle U-100 (EASY TOUCH INSULIN SYRINGE) 31G X 5/16" 1 ML MISC USE AS DIRECTED THREE TIMES A DAY (Patient taking differently: 1 each by Other route in the morning, at noon, and at bedtime.) 100 each 10  . Lancets (ONETOUCH ULTRASOFT) lancets Test 4 times daily DX E11.9 100 each 12  . levothyroxine (SYNTHROID) 50 MCG tablet Take 1 tablet (50 mcg total) by mouth daily at 6 (six) AM.    . megestrol (MEGACE) 40 MG tablet Take 40 mg by mouth daily.  3  . metFORMIN (GLUCOPHAGE-XR) 500 MG 24 hr tablet Take 1,000 mg by mouth 2 (two) times daily.    . mometasone-formoterol (DULERA) 100-5 MCG/ACT AERO Inhale 2 puffs into the lungs 2 (two) times daily.    . Multiple Vitamin (MULTIVITAMIN WITH MINERALS) TABS tablet Take 1 tablet by mouth daily.    Marland Kitchen oxybutynin (DITROPAN-XL) 10 MG 24 hr  tablet Take 15 mg by mouth daily.     . pravastatin (PRAVACHOL) 10 MG tablet Take 10 mg by mouth daily.    . predniSONE (DELTASONE) 1 MG tablet Take 5 mg by mouth daily.     . pregabalin (LYRICA) 75 MG capsule Take 75 mg 3 (three) times daily by mouth.     . QUEtiapine (SEROQUEL) 100 MG tablet Take 100 mg by mouth at bedtime.    . sacubitril-valsartan (ENTRESTO) 97-103 MG Take 1 tablet by mouth 2 (two) times daily. 180 tablet 3  . spironolactone (ALDACTONE) 50 MG tablet TAKE 1 TABLET BY MOUTH EVERY DAY 30 tablet 3  . topiramate (TOPAMAX) 25 MG tablet Take 75 mg by mouth at bedtime.    . torsemide (DEMADEX) 20 MG tablet Take 20 mg by mouth 2 (two) times daily.    . traMADol (ULTRAM) 50 MG tablet Take 1 tablet (50 mg total) by mouth every 6 (six) hours as needed for moderate pain or severe pain. 30 tablet 0   No current facility-administered medications on file prior to visit.    Allergies  Allergen Reactions  . Metoclopramide Other (See Comments)    Developed restless leg, akathisia type limb movements.   . Codeine Itching, Rash and Other (See Comments)  . Penicillins Itching    Tolerated Unasyn 02/01/2020  . Hydrocodone Rash  . Percocet [Oxycodone-Acetaminophen] Hives and Rash    Tolerates dilaudid    Family History  Problem Relation Age of Onset  . Diabetes Mother   . Asthma Mother   . Urticaria Mother   . Hypertension Mother   . Breast cancer Maternal Aunt 51       currently 97  . Stomach cancer Maternal Uncle        dx 47s; deceased  . Prostate cancer Maternal Uncle        deceased 73s  . Cancer Maternal  Aunt        unk. primary  . Cancer Maternal Aunt        unk. primary  . Ovarian cancer Maternal Aunt        deceased 57s  . Hypertension Other   . Stroke Other   . Arthritis Other   . Ovarian cancer Sister 52       maternal half-sister; RAD51C positive  . Cancer Paternal Aunt        unk. primary; deceased 11s  . Prostate cancer Paternal Uncle        deceased 30s   . Breast cancer Cousin        deceased 75s; daughter of mat uncle with stomach ca  . Breast cancer Maternal Aunt        deceased 59s  . Asthma Brother   . Asthma Brother   . Allergic rhinitis Neg Hx   . Angioedema Neg Hx     BP 126/78 (BP Location: Right Arm, Patient Position: Sitting, Cuff Size: Large)   Pulse 98   Ht _0  (1.727 m)   Wt (!) 369 lb (167.4 kg)   LMP 03/24/2017   SpO2 97%   BMI 56.11 kg/m   Review of Systems She denies hypoglycemia.      Objective:   Physical Exam VITAL SIGNS:  See vs page GENERAL: no distress Pulses: dorsalis pedis intact bilat.   MSK: no deformity of the feet.   CV: 1+ bilat leg edema.  Skin:  no ulcer on the feet, but the skin is dry.  normal color and temp on the feet.   Neuro: sensation is intact to touch on the feet.    Lab Results  Component Value Date   HGBA1C 13.4 (A) 12/22/2020       Assessment & Plan:  Insulin-requiring type 2 DM: uncontrolled.    Patient Instructions  check your blood sugar 4 times a day: before the 3 meals, and at bedtime.  also check if you have symptoms of your blood sugar being too high or too low.  please keep a record of the readings and bring it to your next appointment here (or you can bring the meter itself).  You can write it on any piece of paper.  please call us sooner if your blood sugar goes below 70, or if you have a lot of readings over 200, especially if you have to increase the prednisone again.   I have sent a prescription to your pharmacy, to change Byetta to "Victoza."  Please increase this once a week, from 0.6 mg to 1.8 mg, and:  continue the same other medications for now.  On this type of insulin schedule, you should eat meals on a regular schedule (especially lunch).  If a meal is missed or significantly delayed, your blood sugar could go low.   Please come back for a follow-up appointment in 2 months.

## 2020-12-22 NOTE — Patient Instructions (Addendum)
check your blood sugar 4 times a day: before the 3 meals, and at bedtime.  also check if you have symptoms of your blood sugar being too high or too low.  please keep a record of the readings and bring it to your next appointment here (or you can bring the meter itself).  You can write it on any piece of paper.  please call us sooner if your blood sugar goes below 70, or if you have a lot of readings over 200, especially if you have to increase the prednisone again.   I have sent a prescription to your pharmacy, to change Byetta to "Victoza."  Please increase this once a week, from 0.6 mg to 1.8 mg, and:  continue the same other medications for now.  On this type of insulin schedule, you should eat meals on a regular schedule (especially lunch).  If a meal is missed or significantly delayed, your blood sugar could go low.   Please come back for a follow-up appointment in 2 months.

## 2021-01-01 ENCOUNTER — Encounter: Payer: Self-pay | Admitting: Cardiology

## 2021-01-01 ENCOUNTER — Other Ambulatory Visit: Payer: Self-pay

## 2021-01-01 ENCOUNTER — Ambulatory Visit: Payer: Medicaid Other | Admitting: Cardiology

## 2021-01-01 VITALS — BP 166/93 | HR 101 | Temp 98.8°F | Resp 16 | Ht 68.0 in | Wt 368.2 lb

## 2021-01-01 DIAGNOSIS — I428 Other cardiomyopathies: Secondary | ICD-10-CM

## 2021-01-01 DIAGNOSIS — I5032 Chronic diastolic (congestive) heart failure: Secondary | ICD-10-CM

## 2021-01-01 DIAGNOSIS — E785 Hyperlipidemia, unspecified: Secondary | ICD-10-CM

## 2021-01-01 DIAGNOSIS — I10 Essential (primary) hypertension: Secondary | ICD-10-CM

## 2021-01-01 MED ORDER — SPIRONOLACTONE 50 MG PO TABS: 50.0000 mg | ORAL_TABLET | Freq: Every day | ORAL | 3 refills | Status: DC

## 2021-01-01 MED ORDER — CARVEDILOL 25 MG PO TABS: 37.5000 mg | ORAL_TABLET | Freq: Two times a day (BID) | ORAL | 3 refills | Status: DC

## 2021-01-01 MED ORDER — PRAVASTATIN SODIUM 10 MG PO TABS: 10.0000 mg | ORAL_TABLET | Freq: Every day | ORAL | 3 refills | Status: DC

## 2021-01-01 MED ORDER — ISOSORB DINITRATE-HYDRALAZINE 20-37.5 MG PO TABS
ORAL_TABLET | ORAL | 3 refills | Status: DC
Start: 2021-01-01 — End: 2021-10-21

## 2021-01-01 MED ORDER — AMLODIPINE BESYLATE 10 MG PO TABS: 1.0000 | ORAL_TABLET | Freq: Every day | ORAL | 3 refills | Status: DC

## 2021-01-01 NOTE — Progress Notes (Signed)
Primary Physician:  Bartholome Bill, MD   Patient ID: [name], female    DOB: 1970-11-05, 50 y.o.   MRN: [id number]  CC:  Chief Complaint  Patient presents with  . Congestive Heart Failure  . Hypertension  . Follow-up    3 months   Subjective:   HPI: [name] is a 50 y.o. female  with Sjogren's syndrome, SLE, chronic systolic heart failure now with improved EF to 45-50% on echo in Nov 2019, morbid obesity, obesity hypoventilation syndrome, uncontrolled type 2 DM, hypertension, h/o CVA in 2014, OSA on CPAP and hepatic adenoma s/p embolization on 08/12/19.  The patient presents for 6 month follow up for CHF and hypertension. She is on guideline directed medical therapy for heart failure and tolerating well. She was admitted to the hospital 01/29/2020 - 02/07/2020 with COVID-19.   She has recuperated well, has lost about 30 Lbs since Covid infection and has maintained weight loss, but has not bee able to loose more. She was being evaluate at [facility] weight loss clinic, but this has dropped off due to the pandemic.   Chronic dyspnea has remained stable. No leg edema.    Past Medical History:  Diagnosis Date  . Acute renal failure (HCC)     Intractable nausea vomiting secondary to diabetic gastroparesis causing dehydration and acute renal failure /notes 04/01/2013  . Anginal pain ([place])   . Anxiety   . Bell's palsy 08/09/2010  . Bicornuate uterus   . CAP (community acquired pneumonia) 10/2011   Archie Endo 11/08/2011  . Chest pain 01/13/2014  . CHF (congestive heart failure) ([place])   . Diabetic gastroparesis associated with type 2 diabetes mellitus ([place])    this is presumed diagnoses, not confirmed by any studies.   . Eczema   . Family history of malignant neoplasm of breast   . Family history of malignant neoplasm of ovary   . GERD (gastroesophageal reflux disease)   . High cholesterol   . Hypertension   . Liver lesion 08/13/2019  . Liver mass 2014   biopsied 03/2013 at  [facility], not malignant.  is to undergo radiologic ablation of the mass in sept/October 2014.   . Lupus ([place])   . Migraines    "maybe a couple times/yr" (04/01/2013)  . Obesity   . Obstructive sleep apnea on CPAP 2011   Oxygen at nights  . SJOGREN'S SYNDROME 08/09/2010  . SLE (systemic lupus erythematosus) ([place])    Archie Endo 12/01/2010  . Stroke [facility]) 2010; 10/2012   "left side is still weak from it, never fully regained full strength; no additions from stroke 10/2012"    Past Surgical History:  Procedure Laterality Date  . BREAST CYST EXCISION Left 08/2005   epidermoid  . CARDIAC CATHETERIZATION  02/07/12  . ECTOPIC PREGNANCY SURGERY  1999  . ECTOPIC PREGNANCY SURGERY  1999  . ESOPHAGOGASTRODUODENOSCOPY N/A 02/26/2013   Procedure: ESOPHAGOGASTRODUODENOSCOPY (EGD);  Surgeon: Irene Shipper, MD;  Location: [facility] ENDOSCOPY;  Service: Endoscopy;  Laterality: N/A;  . ESOPHAGOGASTRODUODENOSCOPY N/A 03/02/2015   Procedure: ESOPHAGOGASTRODUODENOSCOPY (EGD);  Surgeon: Milus Banister, MD;  Location: [facility];  Service: Endoscopy;  Laterality: N/A;  . HERNIA REPAIR    . LEFT AND RIGHT HEART CATHETERIZATION WITH CORONARY ANGIOGRAM N/A 02/07/2012   Procedure: LEFT AND RIGHT HEART CATHETERIZATION WITH CORONARY ANGIOGRAM;  Surgeon: Laverda Page, MD;  Location: [facility] CATH LAB;  Service: Cardiovascular;  Laterality: N/A;  . LIVER BIOPSY  03/2013   liver mass/medical  hx noted above  . MUSCLE BIOPSY     for lupus/notes 12/01/2010  . UMBILICAL HERNIA REPAIR  1980's   Social History   Tobacco Use  . Smoking status: Former Smoker    Packs/day: 0.50    Years: 3.00    Pack years: 1.50    Types: Cigarettes    Quit date: 06/02/1991    Years since quitting: 29.6  . Smokeless tobacco: Never Used  . Tobacco comment: quit 28 yrs ago  Substance Use Topics  . Alcohol use: No    Alcohol/week: 0.0 standard drinks   Marital Status: Single   Review of Systems  Cardiovascular: Positive for dyspnea on exertion.  Negative for chest pain, leg swelling, orthopnea, palpitations and paroxysmal nocturnal dyspnea.  Musculoskeletal: Positive for arthritis and back pain.  Gastrointestinal: Negative for melena.   Objective:  Blood pressure (!) 166/93, pulse (!) 101, temperature 98.8 F (37.1 C), temperature source Temporal, resp. rate 16, height 5' 8" (1.727 m), weight (!) 368 lb 3.2 oz (167 kg), last menstrual period 03/24/2017, SpO2 96 %. Body mass index is 55.98 kg/m.   Vitals with BMI 01/01/2021 12/22/2020 04/23/2020  Height 5' 8" 5' 8" 5' 8"  Weight 368 lbs 3 oz 369 lbs 342 lbs  BMI 56 31.51 76.16  Systolic 073 710 626  Diastolic 93 78 99  Pulse 948 98 99   Physical Exam Constitutional:      Appearance: She is well-developed.     Comments: Morbidly obese  Neck:     Vascular: No JVD.     Comments: Short neck and difficult to evaluate JVP Cardiovascular:     Rate and Rhythm: Regular rhythm. Tachycardia present.     Pulses: Intact distal pulses.          Carotid pulses are 2+ on the right side and 2+ on the left side.      Dorsalis pedis pulses are 2+ on the right side and 2+ on the left side.       Posterior tibial pulses are 2+ on the right side and 2+ on the left side.     Heart sounds: Normal heart sounds. No murmur heard. No gallop.      Comments: Femoral and popliteal pulse difficult to feel due to patient's body habitus.  Pulmonary:     Effort: Pulmonary effort is normal.     Breath sounds: Normal breath sounds.  Abdominal:     General: Bowel sounds are normal.     Palpations: Abdomen is soft.     Comments: Obese. Pannus present  Musculoskeletal:     Cervical back: Neck supple.    Laboratory examination:   CMP Latest Ref Rng & Units 02/07/2020 02/06/2020 02/05/2020  Glucose 70 - 99 mg/dL 163(H) 228(H) 208(H)  BUN 6 - 20 mg/dL _0 Creatinine 0.44 - 1.00 mg/dL 1.10(H) 1.11(H) 0.99  Sodium 135 - 145 mmol/L 140 138 138  Potassium 3.5 - 5.1 mmol/L 3.3(L) 3.7 4.0  Chloride 98 - 111  mmol/L 105 107 108  CO2 22 - 32 mmol/L _1 Calcium 8.9 - 10.3 mg/dL 8.9 8.6(L) 8.6(L)  Total Protein 6.5 - 8.1 g/dL - - -  Total Bilirubin 0.3 - 1.2 mg/dL - - -  Alkaline Phos 38 - 126 U/L - - -  AST 15 - 41 U/L - - -  ALT 0 - 44 U/L - - -   CBC Latest Ref Rng & Units 02/05/2020 02/04/2020 02/03/2020  WBC 4.0 - 10.5 K/uL 8.4 6.6 5.3  Hemoglobin 12.0 - 15.0 g/dL 10.5(L) 10.7(L) 10.7(L)  Hematocrit 36.0 - 46.0 % 35.1(L) 34.1(L) 35.1(L)  Platelets 150 - 400 K/uL 255 243 262   Lipid Panel     Component Value Date/Time   CHOL 160 03/06/2014 1425   TRIG 125 03/06/2014 1425   HDL 57 03/06/2014 1425   CHOLHDL 2.8 03/06/2014 1425   VLDL 25 03/06/2014 1425   LDLCALC 78 03/06/2014 1425     HEMOGLOBIN A1C Lab Results  Component Value Date   HGBA1C 13.4 (A) 12/22/2020   MPG 254.65 01/30/2020   TSH No results for input(s): TSH in the last 8760 hours.   BNP (last 3 results) Recent Labs    01/29/20 1915  BNP 35.8    ProBNP (last 3 results) No results for input(s): PROBNP in the last 8760 hours.   Medications and allergies   Allergies  Allergen Reactions  . Metoclopramide Other (See Comments)    Developed restless leg, akathisia type limb movements.   . Codeine Itching, Rash and Other (See Comments)  . Penicillins Itching    Tolerated Unasyn 02/01/2020  . Hydrocodone Rash  . Percocet [Oxycodone-Acetaminophen] Hives and Rash    Tolerates dilaudid   Outpatient Medications Prior to Visit  Medication Sig Dispense Refill  . ADVAIR HFA 230-21 MCG/ACT inhaler Inhale 1-2 puffs into the lungs as needed.    Marland Kitchen albuterol (PROVENTIL) (2.5 MG/3ML) 0.083% nebulizer solution Take 2.5 mg by nebulization every 6 (six) hours as needed for wheezing.    Marland Kitchen albuterol (VENTOLIN HFA) 108 (90 Base) MCG/ACT inhaler Inhale 2 puffs into the lungs every 6 (six) hours as needed for wheezing or shortness of breath.    . ALPRAZolam (XANAX) 0.5 MG tablet Take 0.5 mg 2 (two) times daily as needed by mouth  for anxiety or sleep.     . ARIPiprazole (ABILIFY) 5 MG tablet Take 5 mg by mouth daily.    Marland Kitchen aspirin EC 81 MG tablet Take 81 mg by mouth daily. Swallow whole.    . azaTHIOprine (IMURAN) 50 MG tablet Take 150 mg by mouth in the morning and at bedtime.     . B-D UF III MINI PEN NEEDLES 31G X 5 MM MISC USE AS DIRECTED TWICE A DAY (Patient taking differently: 1 each by Other route in the morning and at bedtime.) 100 each 2  . Belimumab (BENLYSTA) 200 MG/ML SOAJ Inject 200 mg into the skin once a week. Tuesdays.    . benzonatate (TESSALON) 100 MG capsule Take 1 capsule by mouth 3 (three) times daily as needed.    . Blood Glucose Monitoring Suppl (ONETOUCH VERIO) w/Device KIT Test 4 times daily.  DX E11.9 1 kit 1  . buPROPion (WELLBUTRIN XL) 300 MG 24 hr tablet Take 300 mg by mouth daily.    . cetirizine (ZYRTEC) 10 MG tablet Take 10 mg by mouth daily as needed for allergies.     . clotrimazole-betamethasone (LOTRISONE) cream Apply 1 application topically 2 (two) times daily. (Patient taking differently: Apply 1 application topically daily as needed (rash).) 45 g 3  . cycloSPORINE (RESTASIS) 0.05 % ophthalmic emulsion Place 1 drop into both eyes in the morning, at noon, and at bedtime.     . diclofenac Sodium (VOLTAREN) 1 % GEL Apply 2 g topically 4 (four) times daily. (Patient taking differently: Apply 2 g topically daily as needed (pain).) 350 g 0  . DULoxetine (CYMBALTA) 60 MG capsule Take 120  mg by mouth at bedtime.    Marland Kitchen EASY TOUCH PEN NEEDLES 32G X 4 MM MISC USE TO INJECT INSULIN TWICE DAILY (Patient taking differently: 1 each by Other route in the morning and at bedtime.) 100 each 9  . famotidine (PEPCID) 20 MG tablet Take 20 mg by mouth 2 (two) times daily.    . fluticasone (FLONASE) 50 MCG/ACT nasal spray Place 2 sprays into both nostrils continuous as needed for allergies or rhinitis.    Marland Kitchen glucose blood (ONETOUCH VERIO) test strip Test 4 times daily DX E11.9 100 each 12  . glucose blood test  strip Test 4 times daily DX E11.9 100 each 12  . HUMALOG KWIKPEN 100 UNIT/ML KwikPen SMARTSIG:10-20 Unit(s) SUB-Q 3 Times Daily    . hydroxychloroquine (PLAQUENIL) 200 MG tablet Take 200 mg by mouth 2 (two) times daily.    . Insulin NPH, Human,, Isophane, (HUMULIN N KWIKPEN) 100 UNIT/ML Kiwkpen Inject 2.2 mLs (220 Units total) into the skin every morning 220 mL 11  . Insulin Syringe-Needle U-100 (EASY TOUCH INSULIN SYRINGE) 31G X 5/16" 1 ML MISC USE AS DIRECTED THREE TIMES A DAY (Patient taking differently: 1 each by Other route in the morning, at noon, and at bedtime.) 100 each 10  . Lancets (ONETOUCH ULTRASOFT) lancets Test 4 times daily DX E11.9 100 each 12  . levothyroxine (SYNTHROID) 50 MCG tablet Take 1 tablet (50 mcg total) by mouth daily at 6 (six) AM.    . liraglutide (VICTOZA) 18 MG/3ML SOPN Inject 1.8 mg into the skin every morning. 9 mL 3  . liraglutide (VICTOZA) 18 MG/3ML SOPN Inject into the skin.    . megestrol (MEGACE) 40 MG tablet Take 40 mg by mouth daily.  3  . metFORMIN (GLUCOPHAGE-XR) 500 MG 24 hr tablet Take 1,000 mg by mouth 2 (two) times daily.    . Multiple Vitamin (MULTIVITAMIN WITH MINERALS) TABS tablet Take 1 tablet by mouth daily.    Marland Kitchen oxybutynin (DITROPAN XL) 15 MG 24 hr tablet Take 15 mg by mouth daily.     . predniSONE (DELTASONE) 1 MG tablet Take 1 mg by mouth 3 (three) times daily.    . pregabalin (LYRICA) 75 MG capsule Take 75 mg 3 (three) times daily by mouth.     . QUEtiapine (SEROQUEL) 100 MG tablet Take 100 mg by mouth at bedtime.    . sacubitril-valsartan (ENTRESTO) 97-103 MG Take 1 tablet by mouth 2 (two) times daily. 180 tablet 3  . topiramate (TOPAMAX) 25 MG tablet Take 75 mg by mouth at bedtime.    . torsemide (DEMADEX) 20 MG tablet Take 20 mg by mouth daily as needed for fluid.    . traMADol (ULTRAM) 50 MG tablet Take 1 tablet (50 mg total) by mouth every 6 (six) hours as needed for moderate pain or severe pain. 30 tablet 0  . amLODipine (NORVASC) 10  MG tablet Take 1 tablet by mouth daily.    Marland Kitchen BIDIL 20-37.5 MG tablet TAKE TWO (2) TABLETS BY MOUTH THREE TIMES DAILY 180 tablet 0  . carvedilol (COREG) 25 MG tablet Take 1.5 tablets (37.5 mg total) by mouth 2 (two) times daily with a meal. 270 tablet 3  . pravastatin (PRAVACHOL) 10 MG tablet Take 10 mg by mouth daily.    Marland Kitchen spironolactone (ALDACTONE) 50 MG tablet TAKE 1 TABLET BY MOUTH EVERY DAY 30 tablet 3  . BYETTA 10 MCG PEN 10 MCG/0.04ML SOPN injection USE 1 INJECTION UNDER THE SKIN 2 TIMES DAILY  WITH A MEAL 2.4 mL 0  . cetaphil (CETAPHIL) lotion Apply topically 2 (two) times daily. (Patient taking differently: Apply 1 application topically 2 (two) times daily. Dry skin) 236 mL 0  . mometasone-formoterol (DULERA) 100-5 MCG/ACT AERO Inhale 2 puffs into the lungs 2 (two) times daily.     No facility-administered medications prior to visit.   Medications after present encounter:  Current Outpatient Medications  Medication Instructions  . ADVAIR HFA 230-21 MCG/ACT inhaler 1-2 puffs, Inhalation, As needed  . albuterol (PROVENTIL) 2.5 mg, Nebulization, Every 6 hours PRN  . albuterol (VENTOLIN HFA) 108 (90 Base) MCG/ACT inhaler 2 puffs, Inhalation, Every 6 hours PRN  . ALPRAZolam (XANAX) 0.5 mg, Oral, 2 times daily PRN  . amLODipine (NORVASC) 10 mg, Oral, Daily  . ARIPiprazole (ABILIFY) 5 mg, Oral, Daily  . aspirin EC 81 mg, Oral, Daily, Swallow whole.   . azaTHIOprine (IMURAN) 150 mg, Oral, 2 times daily  . B-D UF III MINI PEN NEEDLES 31G X 5 MM MISC USE AS DIRECTED TWICE A DAY  . Benlysta 200 mg, Subcutaneous, Weekly, Tuesdays.  . benzonatate (TESSALON) 100 MG capsule 1 capsule, Oral, 3 times daily PRN  . Blood Glucose Monitoring Suppl (ONETOUCH VERIO) w/Device KIT Test 4 times daily.  DX E11.9  . buPROPion (WELLBUTRIN XL) 300 mg, Oral, Daily  . carvedilol (COREG) 37.5 mg, Oral, 2 times daily with meals  . cetirizine (ZYRTEC) 10 mg, Oral, Daily PRN  . clotrimazole-betamethasone  (LOTRISONE) cream 1 application, Topical, 2 times daily  . cycloSPORINE (RESTASIS) 0.05 % ophthalmic emulsion 1 drop, Both Eyes, 3 times daily  . diclofenac Sodium (VOLTAREN) 2 g, Topical, 4 times daily  . DULoxetine (CYMBALTA) 120 mg, Oral, Daily at bedtime  . EASY TOUCH PEN NEEDLES 32G X 4 MM MISC USE TO INJECT INSULIN TWICE DAILY  . famotidine (PEPCID) 20 mg, Oral, 2 times daily  . fluticasone (FLONASE) 50 MCG/ACT nasal spray 2 sprays, Each Nare, Continuous PRN  . glucose blood (ONETOUCH VERIO) test strip Test 4 times daily DX E11.9  . glucose blood test strip Test 4 times daily DX E11.9  . HUMALOG KWIKPEN 100 UNIT/ML KwikPen SMARTSIG:10-20 Unit(s) SUB-Q 3 Times Daily  . hydroxychloroquine (PLAQUENIL) 200 mg, Oral, 2 times daily  . Insulin NPH, Human,, Isophane, (HUMULIN N KWIKPEN) 100 UNIT/ML Kiwkpen Inject 2.2 mLs (220 Units total) into the skin every morning  . Insulin Syringe-Needle U-100 (EASY TOUCH INSULIN SYRINGE) 31G X 5/16" 1 ML MISC USE AS DIRECTED THREE TIMES A DAY  . isosorbide-hydrALAZINE (BIDIL) 20-37.5 MG tablet TAKE TWO (2) TABLETS BY MOUTH THREE TIMES DAILY  . Lancets (ONETOUCH ULTRASOFT) lancets Test 4 times daily DX E11.9  . levothyroxine (SYNTHROID) 50 mcg, Oral, Daily  . liraglutide (VICTOZA) 18 MG/3ML SOPN Subcutaneous  . megestrol (MEGACE) 40 mg, Oral, Daily  . metFORMIN (GLUCOPHAGE-XR) 1,000 mg, Oral, 2 times daily  . Multiple Vitamin (MULTIVITAMIN WITH MINERALS) TABS tablet 1 tablet, Oral, Daily  . oxybutynin (DITROPAN XL) 15 mg, Oral, Daily  . pravastatin (PRAVACHOL) 10 mg, Oral, Daily after supper  . predniSONE (DELTASONE) 1 mg, Oral, 3 times daily  . pregabalin (LYRICA) 75 mg, Oral, 3 times daily  . QUEtiapine (SEROQUEL) 100 mg, Oral, Daily at bedtime  . sacubitril-valsartan (ENTRESTO) 97-103 MG 1 tablet, Oral, 2 times daily  . spironolactone (ALDACTONE) 50 mg, Oral, Daily  . topiramate (TOPAMAX) 75 mg, Oral, Daily at bedtime  . torsemide (DEMADEX) 20 mg,  Oral, Daily PRN  . traMADol Veatrice Bourbon)  50 mg, Oral, Every 6 hours PRN  . Victoza 1.8 mg, Subcutaneous, BH-each morning     Radiology:    CXR 02/01/2020: New patchy bilateral airspace disease most consistent with pneumonia.  Chronic cardiomegaly and vascular congestion.  Cardiac Studies:   Coronary angiogram 02/07/2012: Normal coronary arteries and LVEF. Left dominant circulation.  Carotid duplex 03/06/2014: The vertebral arteries appear patent with antegrade flow. Findings consistent with 1-39 percent stenosis involving theright internal carotid artery and the left internal carotidartery.  Vascular Ultrasound Lower Extremity Venous 03/26/2018: Right: There is no evidence of deep vein thrombosis in the lower extremity. No cystic structure found in the popliteal fossa. Left: No evidence of common femoral vein obstruction.  Echocardiogram 02/02/2020 Left ventricular ejection fraction, by estimation, is 40 to 45%. The left ventricle has mildly decreased function. The left ventricle demonstrates global hypokinesis. Left ventricular diastolic function could not be evaluated.  Right ventricular systolic function is normal. The right ventricular size is mildly enlarged. Tricuspid regurgitation signal is inadequate for assessing PA pressure.  The mitral valve is normal in structure. Trivial mitral valve regurgitation.  The aortic valve is normal in structure. Aortic valve regurgitation is not visualized. No aortic stenosis is present.   EKG   EKG 01/21/2020: Normal sinus rhythm with rate of 100 bpm, left axis deviation, poor R wave progression, cannot exclude anterolateral infarct old.  Low-voltage chest leads, consider pulmonary disease pattern.  Nonspecific T abnormality.  Assessment:     ICD-10-CM   1. Essential hypertension  I10 EKG 12-Lead    amLODipine (NORVASC) 10 MG tablet    isosorbide-hydrALAZINE (BIDIL) 20-37.5 MG tablet  2. Class 3 severe obesity due to excess calories  with serious comorbidity and body mass index (BMI) of 50.0 to 59.9 in adult ([place])  E66.01    Z68.43   3. Chronic diastolic congestive heart failure (HCC)  I50.32 Pro b natriuretic peptide (BNP)    spironolactone (ALDACTONE) 50 MG tablet  4. Non-ischemic cardiomyopathy (HCC)  I42.8 isosorbide-hydrALAZINE (BIDIL) 20-37.5 MG tablet    carvedilol (COREG) 25 MG tablet  5. Mild hyperlipidemia  E78.5 pravastatin (PRAVACHOL) 10 MG tablet    Meds ordered this encounter  Medications  . amLODipine (NORVASC) 10 MG tablet    Sig: Take 1 tablet (10 mg total) by mouth daily.    Dispense:  90 tablet    Refill:  3  . isosorbide-hydrALAZINE (BIDIL) 20-37.5 MG tablet    Sig: TAKE TWO (2) TABLETS BY MOUTH THREE TIMES DAILY    Dispense:  270 tablet    Refill:  3  . carvedilol (COREG) 25 MG tablet    Sig: Take 1.5 tablets (37.5 mg total) by mouth 2 (two) times daily with a meal.    Dispense:  270 tablet    Refill:  3  . pravastatin (PRAVACHOL) 10 MG tablet    Sig: Take 1 tablet (10 mg total) by mouth daily after supper.    Dispense:  90 tablet    Refill:  3  . spironolactone (ALDACTONE) 50 MG tablet    Sig: Take 1 tablet (50 mg total) by mouth daily.    Dispense:  90 tablet    Refill:  3   Medications Discontinued During This Encounter  Medication Reason  . BYETTA 10 MCG PEN 10 MCG/0.04ML SOPN injection Error  . cetaphil (CETAPHIL) lotion Error  . mometasone-formoterol (DULERA) 100-5 MCG/ACT AERO Error  . pravastatin (PRAVACHOL) 10 MG tablet Reorder  . carvedilol (COREG) 25 MG tablet Reorder  .  BIDIL 20-37.5 MG tablet Reorder  . spironolactone (ALDACTONE) 50 MG tablet Reorder  . amLODipine (NORVASC) 10 MG tablet Reorder   Recommendations:   [name]  is a 50 y.o. female  with Sjogren's syndrome, SLE, chronic systolic heart failure now with improved EF to 45-50% on echo in Nov 2019, morbid obesity, obesity hypoventilation syndrome, uncontrolled type 2 DM, hypertension, h/o CVA in 2014, OSA  on CPAP and hepatic  adenoma s/p embolization on 08/12/19.  This is a 23-monthoffice visit, during COVID-19 she lost about 20 to 30 pounds when she was extremely sick and fortunately she has maintained her weight loss but has not lost any additional weight. I also discussed weight loss with the patient. She has lost weight since her hospitalization. She is encouraged to re-[facility]  Today the blood pressure was markedly elevated, I referred her medications, she was out of amlodipine for the last 3 to 4 days.  Today there is no clinical evidence of heart failure, I would like to repeat echocardiogram and also proBNP as a baseline.  She would be a good candidate for "TRENDS trial", patient consents to be screened.  I would like to see her back in 4-6 weeks for  for follow up of CHF and hypertension after repeat echocardiogram.     JAdrian Prows MD, FChristus [facility]6/12/2020, 9:22 AM Office: [phone]

## 2021-01-06 ENCOUNTER — Telehealth: Payer: Self-pay | Admitting: Pharmacy Technician

## 2021-01-06 NOTE — Telephone Encounter (Addendum)
Patient Advocate Encounter   Received notification from  [facility] that prior authorization for HUMULIN N [facility] is required.   PA submitted on 01/06/2021 Confirmation #:  [id number] Status is APPROVED through 01/01/2022 PA: [id number]  [place] TRACKS to [facility] will continue to follow.   Venida Jarvis. Nadara Mustard, CPhT Patient Advocate [facility] Phone: [phone] Fax:  [phone]

## 2021-01-20 ENCOUNTER — Other Ambulatory Visit: Payer: Self-pay

## 2021-01-20 ENCOUNTER — Ambulatory Visit (INDEPENDENT_AMBULATORY_CARE_PROVIDER_SITE_OTHER): Payer: Medicaid Other

## 2021-01-20 DIAGNOSIS — I5032 Chronic diastolic (congestive) heart failure: Secondary | ICD-10-CM

## 2021-01-20 DIAGNOSIS — I428 Other cardiomyopathies: Secondary | ICD-10-CM

## 2021-01-28 NOTE — Progress Notes (Signed)
Patient will be screened into LUX-Sx TRENDS study (loop implantation for heart failure sensor).   Echocardiogram 01/20/2021: Mildly depressed LV systolic function with visual EF 45-50%. Left ventricle cavity is mildly dilated. Moderate left ventricular hypertrophy. Hypokinetic global wall motion.  Doppler evidence of grade I (impaired) diastolic dysfunction, normal LAP. Left atrial cavity is normal in size. Mild (Grade I) mitral regurgitation. Mild tricuspid regurgitation. No evidence of pulmonary hypertension. Insignificant pericardial effusion. Compared to study dated 02/02/2020: G1DD is new, Mild MR and TR are new, otherwise no significant change.

## 2021-02-02 ENCOUNTER — Other Ambulatory Visit: Payer: Self-pay

## 2021-02-02 DIAGNOSIS — Z794 Long term (current) use of insulin: Secondary | ICD-10-CM

## 2021-02-02 DIAGNOSIS — E1143 Type 2 diabetes mellitus with diabetic autonomic (poly)neuropathy: Secondary | ICD-10-CM

## 2021-02-02 MED ORDER — METFORMIN HCL ER 500 MG PO TB24: 1000.0000 mg | ORAL_TABLET | Freq: Two times a day (BID) | ORAL | 3 refills | Status: AC

## 2021-02-08 ENCOUNTER — Ambulatory Visit: Payer: Medicaid Other | Admitting: Cardiology

## 2021-02-12 ENCOUNTER — Emergency Department (HOSPITAL_COMMUNITY): Payer: Medicaid Other

## 2021-02-12 ENCOUNTER — Ambulatory Visit: Payer: Medicaid Other | Admitting: Cardiology

## 2021-02-12 ENCOUNTER — Other Ambulatory Visit: Payer: Self-pay

## 2021-02-12 ENCOUNTER — Encounter (HOSPITAL_COMMUNITY): Payer: Self-pay | Admitting: Emergency Medicine

## 2021-02-12 ENCOUNTER — Emergency Department (HOSPITAL_COMMUNITY)
Admission: EM | Admit: 2021-02-12 | Discharge: 2021-02-12 | Disposition: A | Discharge status: Home / Self Care | Payer: Medicaid Other | Attending: Emergency Medicine | Admitting: Emergency Medicine

## 2021-02-12 DIAGNOSIS — Z87891 Personal history of nicotine dependence: Secondary | ICD-10-CM | POA: Insufficient documentation

## 2021-02-12 DIAGNOSIS — Z8616 Personal history of COVID-19: Secondary | ICD-10-CM | POA: Insufficient documentation

## 2021-02-12 DIAGNOSIS — I10 Essential (primary) hypertension: Secondary | ICD-10-CM

## 2021-02-12 DIAGNOSIS — K5900 Constipation, unspecified: Secondary | ICD-10-CM

## 2021-02-12 DIAGNOSIS — R079 Chest pain, unspecified: Secondary | ICD-10-CM | POA: Insufficient documentation

## 2021-02-12 DIAGNOSIS — Z794 Long term (current) use of insulin: Secondary | ICD-10-CM | POA: Diagnosis not present

## 2021-02-12 DIAGNOSIS — R109 Unspecified abdominal pain: Secondary | ICD-10-CM

## 2021-02-12 DIAGNOSIS — I110 Hypertensive heart disease with heart failure: Secondary | ICD-10-CM | POA: Insufficient documentation

## 2021-02-12 DIAGNOSIS — I5042 Chronic combined systolic (congestive) and diastolic (congestive) heart failure: Secondary | ICD-10-CM | POA: Insufficient documentation

## 2021-02-12 DIAGNOSIS — Z79899 Other long term (current) drug therapy: Secondary | ICD-10-CM | POA: Insufficient documentation

## 2021-02-12 DIAGNOSIS — E119 Type 2 diabetes mellitus without complications: Secondary | ICD-10-CM | POA: Diagnosis not present

## 2021-02-12 DIAGNOSIS — R1032 Left lower quadrant pain: Secondary | ICD-10-CM | POA: Diagnosis not present

## 2021-02-12 DIAGNOSIS — Z7982 Long term (current) use of aspirin: Secondary | ICD-10-CM | POA: Insufficient documentation

## 2021-02-12 DIAGNOSIS — R112 Nausea with vomiting, unspecified: Secondary | ICD-10-CM

## 2021-02-12 LAB — TROPONIN I (HIGH SENSITIVITY)
Troponin I (High Sensitivity): 4 ng/L (ref <18)
Troponin I (High Sensitivity): 5 ng/L (ref <18)

## 2021-02-12 LAB — COMPREHENSIVE METABOLIC PANEL
ALT: 12 U/L (ref 0–44)
AST: 12 U/L — ABNORMAL LOW (ref 15–41)
Albumin: 3.6 g/dL (ref 3.5–5.0)
Alkaline Phosphatase: 96 U/L (ref 38–126)
Anion gap: 7 (ref 5–15)
BUN: 11 mg/dL (ref 6–20)
CO2: 21 mmol/L — ABNORMAL LOW (ref 22–32)
Calcium: 9.4 mg/dL (ref 8.9–10.3)
Chloride: 109 mmol/L (ref 98–111)
Creatinine, Ser: 0.92 mg/dL (ref 0.44–1.00)
GFR, Estimated: >60 mL/min (ref >60)
Glucose, Bld: 301 mg/dL — ABNORMAL HIGH (ref 70–99)
Potassium: 3.7 mmol/L (ref 3.5–5.1)
Sodium: 137 mmol/L (ref 135–145)
Total Bilirubin: 0.5 mg/dL (ref 0.3–1.2)
Total Protein: 7.2 g/dL (ref 6.5–8.1)

## 2021-02-12 LAB — URINALYSIS, ROUTINE W REFLEX MICROSCOPIC
Bilirubin Urine: NEGATIVE
Glucose, UA: >=500 mg/dL — AB
Hgb urine dipstick: NEGATIVE
Ketones, ur: 5 mg/dL — AB
Nitrite: NEGATIVE
Protein, ur: 100 mg/dL — AB
Specific Gravity, Urine: 1.033 — ABNORMAL HIGH (ref 1.005–1.030)
pH: 5 (ref 5.0–8.0)

## 2021-02-12 LAB — CBC WITH DIFFERENTIAL/PLATELET
Abs Immature Granulocytes: 0.04 10*3/uL (ref 0.00–0.07)
Basophils Absolute: 0 10*3/uL (ref 0.0–0.1)
Basophils Relative: 0 %
Eosinophils Absolute: 0.2 10*3/uL (ref 0.0–0.5)
Eosinophils Relative: 2 %
HCT: 37.9 % (ref 36.0–46.0)
Hemoglobin: 12.1 g/dL (ref 12.0–15.0)
Immature Granulocytes: 0 %
Lymphocytes Relative: 20 %
Lymphs Abs: 1.9 10*3/uL (ref 0.7–4.0)
MCH: 28.1 pg (ref 26.0–34.0)
MCHC: 31.9 g/dL (ref 30.0–36.0)
MCV: 87.9 fL (ref 80.0–100.0)
Monocytes Absolute: 0.5 10*3/uL (ref 0.1–1.0)
Monocytes Relative: 5 %
Neutro Abs: 6.8 10*3/uL (ref 1.7–7.7)
Neutrophils Relative %: 73 %
Platelets: 286 10*3/uL (ref 150–400)
RBC: 4.31 MIL/uL (ref 3.87–5.11)
RDW: 13.7 % (ref 11.5–15.5)
WBC: 9.5 10*3/uL (ref 4.0–10.5)
nRBC: 0 % (ref 0.0–0.2)

## 2021-02-12 LAB — LIPASE, BLOOD: Lipase: 26 U/L (ref 11–51)

## 2021-02-12 MED ORDER — ONDANSETRON 4 MG PO TBDP: ORAL_TABLET | ORAL | 0 refills | Status: DC

## 2021-02-12 MED ORDER — DIPHENHYDRAMINE HCL 50 MG/ML IJ SOLN
25.0000 mg | Freq: Once | INTRAMUSCULAR | Status: AC
  Administered 2021-02-12: 25 mg via INTRAVENOUS
  Filled 2021-02-12: qty 1

## 2021-02-12 MED ORDER — SODIUM CHLORIDE 0.9 % IV BOLUS
500.0000 mL | Freq: Once | INTRAVENOUS | Status: AC
  Administered 2021-02-12: 500 mL via INTRAVENOUS

## 2021-02-12 MED ORDER — FENTANYL CITRATE (PF) 100 MCG/2ML IJ SOLN
50.0000 ug | Freq: Once | INTRAMUSCULAR | Status: AC
  Administered 2021-02-12: 50 ug via INTRAVENOUS
  Filled 2021-02-12: qty 2

## 2021-02-12 MED ORDER — ESOMEPRAZOLE MAGNESIUM 40 MG PO CPDR: 40.0000 mg | DELAYED_RELEASE_CAPSULE | Freq: Every day | ORAL | 0 refills | Status: DC

## 2021-02-12 MED ORDER — CARVEDILOL 12.5 MG PO TABS
37.5000 mg | ORAL_TABLET | Freq: Once | ORAL | Status: AC
  Administered 2021-02-12: 37.5 mg via ORAL
  Filled 2021-02-12: qty 3

## 2021-02-12 MED ORDER — ONDANSETRON 4 MG PO TBDP
4.0000 mg | ORAL_TABLET | Freq: Once | ORAL | Status: AC
  Administered 2021-02-12: 4 mg via ORAL
  Filled 2021-02-12: qty 1

## 2021-02-12 MED ORDER — FAMOTIDINE IN NACL 20-0.9 MG/50ML-% IV SOLN
20.0000 mg | Freq: Once | INTRAVENOUS | Status: AC
  Administered 2021-02-12: 20 mg via INTRAVENOUS
  Filled 2021-02-12: qty 50

## 2021-02-12 MED ORDER — POLYETHYLENE GLYCOL 3350 17 G PO PACK: 17.0000 g | PACK | Freq: Every day | ORAL | 0 refills | Status: DC

## 2021-02-12 MED ORDER — PROCHLORPERAZINE EDISYLATE 10 MG/2ML IJ SOLN
10.0000 mg | Freq: Once | INTRAMUSCULAR | Status: AC
  Administered 2021-02-12: 10 mg via INTRAVENOUS
  Filled 2021-02-12: qty 2

## 2021-02-12 NOTE — ED Notes (Signed)
Pt able to tolerate PO fluids.  

## 2021-02-12 NOTE — ED Triage Notes (Signed)
Pt arrive by [facility] from home for c/o ABD pain and cp with nausea and vomiting for the past 2 days. 324 mg ASA given by EMS pta. BP 162/100 HR 96 R 20 97% RA Cbg 345

## 2021-02-12 NOTE — ED Provider Notes (Signed)
[facility] EMERGENCY DEPARTMENT Provider Note   CSN: [id number] Arrival date & time: 02/12/21  4098     History Chief Complaint  Patient presents with   Abdominal Pain   Chest Pain    [name] is a 50 y.o. female history of heart failure with EF of 45%, diabetic gastroparesis, hypertension, previous liver mass, here presenting with abdominal pain and chest pain.  Patient states that she has been constipated for the last 3 days.  She started having left-sided abdominal pain and back pain for the last 3 to 4 days.  Patient states that today she started having chest pain.  She states that the pain is burning sensation.  She states that this is after she vomited about 8 times.  Patient was concerned that her liver mass has grown so wanted to come to the ER for evaluation.   The history is provided by the patient.      Past Medical History:  Diagnosis Date   Acute renal failure (HCC)     Intractable nausea vomiting secondary to diabetic gastroparesis causing dehydration and acute renal failure /notes 04/01/2013   Anginal pain ([place])    Anxiety    Bell's palsy 08/09/2010   Bicornuate uterus    CAP (community acquired pneumonia) 10/2011   Archie Endo 11/08/2011   Chest pain 01/13/2014   CHF (congestive heart failure) ([place])    Diabetic gastroparesis associated with type 2 diabetes mellitus ([place])    this is presumed diagnoses, not confirmed by any studies.    Eczema    Family history of malignant neoplasm of breast    Family history of malignant neoplasm of ovary    GERD (gastroesophageal reflux disease)    High cholesterol    Hypertension    Liver lesion 08/13/2019   Liver mass 2014   biopsied 03/2013 at [facility], not malignant.  is to undergo radiologic ablation of the mass in sept/October 2014.    Lupus ([place])    Migraines    "maybe a couple times/yr" (04/01/2013)   Obesity    Obstructive sleep apnea on CPAP 2011   Oxygen at nights   SJOGREN'S SYNDROME  08/09/2010   SLE (systemic lupus erythematosus) ([place])    Archie Endo 12/01/2010   Stroke ([place]) 2010; 10/2012   "left side is still weak from it, never fully regained full strength; no additions from stroke 10/2012"    Patient Active Problem List   Diagnosis Date Noted   Bacteremia    Pneumonia due to COVID-19 virus 01/29/2020   Pressure injury of skin 08/30/2019   Physical debility 08/29/2019   Obesity hypoventilation syndrome ([place]) 08/23/2019   Skin breakdown 08/23/2019   Essential hypertension 08/23/2019   Liver hemorrhage 08/23/2019   Acute blood loss anemia 08/13/2019   Hepatic adenoma    Hemoperitoneum    Diabetic acidosis without coma ([place])    Multifocal pneumonia 09/23/2017   Acute respiratory failure ([place]) 09/21/2017   CHF exacerbation ([place]) 09/20/2017   Costochondritis 06/06/2017   Chest pain 06/04/2017   Diabetes ([place]) 09/19/2015   Radiculopathy of cervical spine    Severe headache 03/05/2014   Family history of malignant neoplasm of breast    Family history of malignant neoplasm of ovary    Intractable nausea and vomiting 11/11/2013   Septate uterus 09/08/2013   Diabetic gastroparesis ([place]) 04/28/2013   Constipation due to pain medication 04/21/2013   Odynophagia 04/04/2013   Liver masses 02/28/2013   Reflux esophagitis 02/26/2013  Morbid obesity ([place]) 10/10/2012   Chronic diastolic CHF (congestive heart failure) ([place]) 10/09/2012   Chronic respiratory failure with hypoxia ([place]) 10/09/2012   Clinical polymyositis syndrome ([place]) 09/06/2012   Mononeuritis of lower limb 01/12/2012   HTN (hypertension) 87/86/7672   Chronic systolic CHF (congestive heart failure) ([place]) 11/08/2011   HLD (hyperlipidemia) 07/19/2011   Bell's palsy 08/09/2010   SLE 08/09/2010   SJOGREN'S SYNDROME 08/09/2010    Past Surgical History:  Procedure Laterality Date   BREAST CYST EXCISION Left 08/2005   epidermoid   CARDIAC CATHETERIZATION  02/07/12   ECTOPIC PREGNANCY SURGERY  1999   ECTOPIC  PREGNANCY SURGERY  1999   ESOPHAGOGASTRODUODENOSCOPY N/A 02/26/2013   Procedure: ESOPHAGOGASTRODUODENOSCOPY (EGD);  Surgeon: Irene Shipper, MD;  Location[facility] ENDOSCOPY;  Service: Endoscopy;  Laterality: N/A;   ESOPHAGOGASTRODUODENOSCOPY N/A 03/02/2015   Procedure: ESOPHAGOGASTRODUODENOSCOPY (EGD);  Surgeon: Milus Banister, MD;  Location: [facility];  Service: Endoscopy;  Laterality: N/A;   HERNIA REPAIR     LEFT AND RIGHT HEART CATHETERIZATION WITH CORONARY ANGIOGRAM N/A 02/07/2012   Procedure: LEFT AND RIGHT HEART CATHETERIZATION WITH CORONARY ANGIOGRAM;  Surgeon: Laverda Page, MD;  Location: [facility] CATH LAB;  Service: Cardiovascular;  Laterality: N/A;   LIVER BIOPSY  03/2013   liver mass/medical hx noted above   MUSCLE BIOPSY     for lupus/notes 0/04/4708   UMBILICAL HERNIA REPAIR  17's     OB History     Gravida  2   Para  1   Term  1   Preterm      AB  1   Living  1      SAB      IAB      Ectopic  1   Multiple      Live Births  1           Family History  Problem Relation Age of Onset   Diabetes Mother    Asthma Mother    Urticaria Mother    Hypertension Mother    Breast cancer Maternal Aunt 89       currently 55   Stomach cancer Maternal Uncle        dx 80s; deceased   Prostate cancer Maternal Uncle        deceased 58s   Cancer Maternal Aunt        unk. primary   Cancer Maternal Aunt        unk. primary   Ovarian cancer Maternal Aunt        deceased 7s   Hypertension Other    Stroke Other    Arthritis Other    Ovarian cancer Sister 14       maternal half-sister; RAD51C positive   Cancer Paternal Aunt        unk. primary; deceased 77s   Prostate cancer Paternal Uncle        deceased 81s   Breast cancer Cousin        deceased 74s; daughter of mat uncle with stomach ca   Breast cancer Maternal Aunt        deceased 34s   Asthma Brother    Asthma Brother    Allergic rhinitis Neg Hx    Angioedema Neg Hx     Social History   Tobacco  Use   Smoking status: Former    Packs/day: 0.50    Years: 3.00    Pack years: 1.50    Types: Cigarettes    Quit  date: 06/02/1991    Years since quitting: 29.7   Smokeless tobacco: Never   Tobacco comments:    quit 28 yrs ago  Vaping Use   Vaping Use: Never used  Substance Use Topics   Alcohol use: No    Alcohol/week: 0.0 standard drinks   Drug use: No    Home Medications Prior to Admission medications   Medication Sig Start Date End Date Taking? Authorizing Provider  ADVAIR HFA 230-21 MCG/ACT inhaler Inhale 1-2 puffs into the lungs as needed. 12/20/20   [provider]  albuterol (PROVENTIL) (2.5 MG/3ML) 0.083% nebulizer solution Take 2.5 mg by nebulization every 6 (six) hours as needed for wheezing.    [provider]  albuterol (VENTOLIN HFA) 108 (90 Base) MCG/ACT inhaler Inhale 2 puffs into the lungs every 6 (six) hours as needed for wheezing or shortness of breath.    [provider]  ALPRAZolam Duanne Moron) 0.5 MG tablet Take 0.5 mg 2 (two) times daily as needed by mouth for anxiety or sleep.  04/19/17   [provider]  amLODipine (NORVASC) 10 MG tablet Take 1 tablet (10 mg total) by mouth daily. 01/01/21   Adrian Prows, MD  ARIPiprazole (ABILIFY) 5 MG tablet Take 5 mg by mouth daily. 12/08/20   [provider]  aspirin EC 81 MG tablet Take 81 mg by mouth daily. Swallow whole.    [provider]  azaTHIOprine (IMURAN) 50 MG tablet Take 150 mg by mouth in the morning and at bedtime.     [provider]  B-D UF III MINI PEN NEEDLES 31G X 5 MM MISC USE AS DIRECTED TWICE A DAY Patient taking differently: 1 each by Other route in the morning and at bedtime. 10/31/18   Renato Shin, MD  Belimumab (BENLYSTA) 200 MG/ML SOAJ Inject 200 mg into the skin once a week. Tuesdays. 12/19/19   [provider]  benzonatate (TESSALON) 100 MG capsule Take 1 capsule by mouth 3 (three) times daily as needed. 05/23/19   [provider]   Blood Glucose Monitoring Suppl (ONETOUCH VERIO) w/Device KIT Test 4 times daily.  DX E11.9 04/27/20   Elayne Snare, MD  buPROPion (WELLBUTRIN XL) 300 MG 24 hr tablet Take 300 mg by mouth daily. 06/28/19   [provider]  carvedilol (COREG) 25 MG tablet Take 1.5 tablets (37.5 mg total) by mouth 2 (two) times daily with a meal. 01/01/21   Adrian Prows, MD  cetirizine (ZYRTEC) 10 MG tablet Take 10 mg by mouth daily as needed for allergies.     [provider]  clotrimazole-betamethasone (LOTRISONE) cream Apply 1 application topically 2 (two) times daily. Patient taking differently: Apply 1 application topically daily as needed (rash). 01/01/19   Renato Shin, MD  cycloSPORINE (RESTASIS) 0.05 % ophthalmic emulsion Place 1 drop into both eyes in the morning, at noon, and at bedtime.     [provider]  diclofenac Sodium (VOLTAREN) 1 % GEL Apply 2 g topically 4 (four) times daily. Patient taking differently: Apply 2 g topically daily as needed (pain). 09/18/19   Love, Ivan Anchors, PA-C  DULoxetine (CYMBALTA) 60 MG capsule Take 120 mg by mouth at bedtime. 08/31/20   [provider]  EASY TOUCH PEN NEEDLES 32G X 4 MM MISC USE TO INJECT INSULIN TWICE DAILY Patient taking differently: 1 each by Other route in the morning and at bedtime. 08/07/19   Renato Shin, MD  famotidine (PEPCID) 20 MG tablet Take 20 mg by mouth 2 (  two) times daily.    [provider]  fluticasone (FLONASE) 50 MCG/ACT nasal spray Place 2 sprays into both nostrils continuous as needed for allergies or rhinitis.    [provider]  glucose blood (ONETOUCH VERIO) test strip Test 4 times daily DX E11.9 04/27/20   Elayne Snare, MD  glucose blood test strip Test 4 times daily DX E11.9 04/27/20   Elayne Snare, MD  HUMALOG KWIKPEN 100 UNIT/ML KwikPen SMARTSIG:10-20 Unit(s) SUB-Q 3 Times Daily 12/20/20   [provider]  hydroxychloroquine (PLAQUENIL) 200 MG tablet Take 200 mg by mouth 2 (two)  times daily.    [provider]  Insulin NPH, Human,, Isophane, (HUMULIN N KWIKPEN) 100 UNIT/ML Kiwkpen Inject 2.2 mLs (220 Units total) into the skin every morning 12/22/20   Renato Shin, MD  Insulin Syringe-Needle U-100 (EASY TOUCH INSULIN SYRINGE) 31G X 5/16" 1 ML MISC USE AS DIRECTED THREE TIMES A DAY Patient taking differently: 1 each by Other route in the morning, at noon, and at bedtime. 10/12/18   Renato Shin, MD  isosorbide-hydrALAZINE (BIDIL) 20-37.5 MG tablet TAKE TWO (2) TABLETS BY MOUTH THREE TIMES DAILY 01/01/21   Adrian Prows, MD  Lancets [facility] ULTRASOFT) lancets Test 4 times daily DX E11.9 04/27/20   Elayne Snare, MD  levothyroxine (SYNTHROID) 50 MCG tablet Take 1 tablet (50 mcg total) by mouth daily at 6 (six) AM. 08/30/19   Raiford Noble Latif, DO  liraglutide (VICTOZA) 18 MG/3ML SOPN Inject 1.8 mg into the skin every morning. 12/22/20   Renato Shin, MD  liraglutide (VICTOZA) 18 MG/3ML SOPN Inject into the skin.    [provider]  megestrol (MEGACE) 40 MG tablet Take 40 mg by mouth daily. 01/30/15   [provider]  metFORMIN (GLUCOPHAGE-XR) 500 MG 24 hr tablet Take 2 tablets (1,000 mg total) by mouth 2 (two) times daily. 02/02/21   Renato Shin, MD  Multiple Vitamin (MULTIVITAMIN WITH MINERALS) TABS tablet Take 1 tablet by mouth daily. 08/30/19   Raiford Noble Latif, DO  oxybutynin (DITROPAN XL) 15 MG 24 hr tablet Take 15 mg by mouth daily.  08/30/17   [provider]  pravastatin (PRAVACHOL) 10 MG tablet Take 1 tablet (10 mg total) by mouth daily after supper. 01/01/21   Adrian Prows, MD  predniSONE (DELTASONE) 1 MG tablet Take 1 mg by mouth 3 (three) times daily. 04/21/17   [provider]  pregabalin (LYRICA) 75 MG capsule Take 75 mg 3 (three) times daily by mouth.  01/28/15   [provider]  QUEtiapine (SEROQUEL) 100 MG tablet Take 100 mg by mouth at bedtime.    [provider]  sacubitril-valsartan (ENTRESTO) 97-103 MG  Take 1 tablet by mouth 2 (two) times daily. 01/21/20   Adrian Prows, MD  spironolactone (ALDACTONE) 50 MG tablet Take 1 tablet (50 mg total) by mouth daily. 01/01/21   Adrian Prows, MD  topiramate (TOPAMAX) 25 MG tablet Take 75 mg by mouth at bedtime. 03/17/16   [provider]  torsemide (DEMADEX) 20 MG tablet Take 20 mg by mouth daily as needed for fluid. 09/12/19   [provider]  traMADol (ULTRAM) 50 MG tablet Take 1 tablet (50 mg total) by mouth every 6 (six) hours as needed for moderate pain or severe pain. 06/09/20   Pete Pelt, PA-C    Allergies    Metoclopramide, Codeine, Penicillins, Hydrocodone, and Percocet [oxycodone-acetaminophen]  Review of Systems   Review of Systems  Cardiovascular:  Positive for chest pain.  Gastrointestinal:  Positive for abdominal pain and vomiting.  All other systems reviewed and are negative.  Physical Exam Updated Vital Signs BP (!) 141/95 (BP Location: Right Arm)   Pulse 100   Temp 98.4 F (36.9 C) (Oral)   Resp 15   Ht 5' 8"  (1.727 m)   Wt (!) 167 kg   LMP 03/24/2017   SpO2 100%   BMI 55.98 kg/m   Physical Exam Vitals and nursing note reviewed.  Constitutional:      Comments: Uncomfortable, vomiting  HENT:     Head: Normocephalic.     Mouth/Throat:     Pharynx: Oropharynx is clear.  Eyes:     Extraocular Movements: Extraocular movements intact.     Pupils: Pupils are equal, round, and reactive to light.  Cardiovascular:     Rate and Rhythm: Normal rate and regular rhythm.  Abdominal:     Comments: Obese, mild left lower quadrant and left flank tenderness  Skin:    General: Skin is warm.     Capillary Refill: Capillary refill takes less than 2 seconds.  Neurological:     General: No focal deficit present.     Mental Status: She is oriented to person, place, and time.  Psychiatric:        Mood and Affect: Mood normal.        Behavior: Behavior normal.    ED Results / Procedures / Treatments   Labs (all  labs ordered are listed, but only abnormal results are displayed) Labs Reviewed  COMPREHENSIVE METABOLIC PANEL - Abnormal; Notable for the following components:      Result Value   CO2 21 (*)    Glucose, Bld 301 (*)    AST 12 (*)    All other components within normal limits  CBC WITH DIFFERENTIAL/PLATELET  LIPASE, BLOOD  URINALYSIS, ROUTINE W REFLEX MICROSCOPIC  TROPONIN I (HIGH SENSITIVITY)  TROPONIN I (HIGH SENSITIVITY)    EKG EKG Interpretation  Date/Time:  Friday February 12 2021 16:10:40 EDT Ventricular Rate:  103 PR Interval:  146 QRS Duration: 90 QT Interval:  304 QTC Calculation: 398 R Axis:   7 Text Interpretation: Sinus tachycardia Cannot rule out Anterior infarct , age undetermined Abnormal ECG No significant change since last tracing Confirmed by Wandra Arthurs [phone]) on 02/12/2021 9:09:34 PM  Radiology DG Chest 2 View  Result Date: 02/12/2021 CLINICAL DATA:  Chest pain EXAM: CHEST - 2 VIEW COMPARISON:  None. FINDINGS: Normal mediastinum and cardiac silhouette. Normal pulmonary vasculature. No evidence of effusion, infiltrate, or pneumothorax. No acute bony abnormality. IMPRESSION: No acute cardiopulmonary process. Electronically Signed   By: Suzy Bouchard M.D.   On: 02/12/2021 17:00   CT Renal Stone Study  Result Date: 02/12/2021 CLINICAL DATA:  Nausea and vomiting for 2 days, abdominal pain EXAM: CT ABDOMEN AND PELVIS WITHOUT CONTRAST TECHNIQUE: Multidetector CT imaging of the abdomen and pelvis was performed following the standard protocol without IV contrast. Unenhanced CT was performed per clinician order. Lack of IV contrast limits sensitivity and specificity, especially for evaluation of abdominal/pelvic solid viscera. COMPARISON:  01/29/2020 FINDINGS: Lower chest: No acute pleural or parenchymal lung disease. Minimal left lower lobe parenchymal lung scarring. Hepatobiliary: No focal liver abnormality is seen. No gallstones, gallbladder wall thickening, or biliary  dilatation. Pancreas: Unremarkable. No pancreatic ductal dilatation or surrounding inflammatory changes. Spleen: Normal in size without focal abnormality. Adrenals/Urinary Tract: No urinary tract calculi or obstructive uropathy within either kidney. The adrenals and bladder are grossly normal.  Stomach/Bowel: No bowel obstruction or ileus. Normal appendix right lower quadrant. No bowel wall thickening or inflammatory change. Vascular/Lymphatic: No significant vascular findings are present. No enlarged abdominal or pelvic lymph nodes. Reproductive: Uterus and bilateral adnexa are unremarkable. Other: No free fluid or free gas.  No abdominal wall hernia. Musculoskeletal: No acute or destructive bony lesions. Reconstructed images demonstrate no additional findings. IMPRESSION: 1. No evidence of urinary tract calculi or obstructive uropathy. 2. Unremarkable unenhanced CT of the abdomen and pelvis. Electronically Signed   By: Randa Ngo M.D.   On: 02/12/2021 19:27    Procedures Procedures   Medications Ordered in ED Medications  sodium chloride 0.9 % bolus 500 mL (has no administration in time range)  fentaNYL (SUBLIMAZE) injection 50 mcg (has no administration in time range)  prochlorperazine (COMPAZINE) injection 10 mg (has no administration in time range)  diphenhydrAMINE (BENADRYL) injection 25 mg (has no administration in time range)  famotidine (PEPCID) IVPB 20 mg premix (has no administration in time range)  ondansetron (ZOFRAN-ODT) disintegrating tablet 4 mg (4 mg Oral Given 02/12/21 1626)    ED Course  I have reviewed the triage vital signs and the nursing notes.  Pertinent labs & imaging results that were available during my care of the patient were reviewed by me and considered in my medical decision making (see chart for details).    MDM Rules/Calculators/A&P                         IZAMAR LINDEN is a 50 y.o. female here with abdominal pain and chest pain and flank pain. Consider  diverticulitis vs renal colic vs reflux vs SBO, low suspicion for ACS or dissection. Will get labs, UA, CT ab/pel, Trop x 2. Will give pain meds and pepcid and reassess.   11:25 PM Labs unremarkable.  CT unremarkable.  Troponin negative x2.  UA showed no obvious UTI and likely contaminated.  Patient has no urinary symptoms.  Patient able to tolerate p.o. after given nausea medicine.  Wonder if she has some exacerbation of diabetic gastroparesis causing reflux symptoms.  At this point, patient stable for discharge.  We will send patient home with some nausea medicine and PPIs and MiraLAX for constipation  Final Clinical Impression(s) / ED Diagnoses Final diagnoses:  None    Rx / DC Orders ED Discharge Orders     None        Drenda Freeze, MD 02/12/21 2327

## 2021-02-12 NOTE — Discharge Instructions (Addendum)
Take Zofran for nausea.  Please take Nexium daily to help with your reflux  Take MiraLAX daily to help with constipation  Continue your current blood pressure medicine  Follow-up with your primary care doctor in a week  Return to ER if you have worse abdominal pain, chest pain, trouble breathing, fever

## 2021-02-12 NOTE — ED Provider Notes (Signed)
Emergency Medicine Provider Triage Evaluation Note  [name] , a 50 y.o. female  was evaluated in triage.  Pt complains of left flank pain, chest tightness, nausea, and vomiting.  Patient has had the symptoms over the last 2 days.  Patient reports 8 episodes of vomiting last 24 hours.  Patient describes emesis as stomach contents and bilious.  Denies any hematemesis or coffee-ground emesis.  Patient states that chest tightness has been intermittent.  Tightness became worse today at approximate 1500 just prior to calling EMS.  Patient reports that she has not had a bowel movement in the last 3 days.  Denies any history of previous abdominal surgery.  Denies any fever, chills, diarrhea, vaginal pain, vaginal bleeding, vaginal discharge, dysuria, hematuria.  Review of Systems  Positive: Chest tightness, nausea, vomiting, left flank pain Negative: ever, chills, diarrhea, vaginal pain, vaginal bleeding, vaginal discharge, dysuria, hematuria.  Physical Exam  BP (!) 172/103   Pulse 98   Temp 99.4 F (37.4 C) (Oral)   Resp 20   Ht 5\' 8"  (1.727 m)   Wt (!) 167 kg   LMP 03/24/2017   SpO2 100%   BMI 55.98 kg/m  Gen:   Awake, no distress   Resp:  Normal effort, lungs clear to auscultation bilaterally MSK:   Moves extremities without difficulty  Other:  Abdomen soft, nondistended, nontender.  Tenderness to left flank.  Positive CVA tenderness.  Medical Decision Making  Medically screening exam initiated at 4:16 PM.  Appropriate orders placed.  [name] was informed that the remainder of the evaluation will be completed by another provider, this initial triage assessment does not replace that evaluation, and the importance of remaining in the ED until their evaluation is complete.  The patient appears stable so that the remainder of the work up may be completed by another provider.      [name] Beckwith, PA-C 02/12/21 1618    [name] Freeze, MD 02/12/21 [phone]

## 2021-02-17 ENCOUNTER — Emergency Department (HOSPITAL_COMMUNITY)
Admission: EM | Admit: 2021-02-17 | Discharge: 2021-02-17 | Disposition: A | Discharge status: Home / Self Care | Payer: Medicaid Other | Attending: Emergency Medicine | Admitting: Emergency Medicine

## 2021-02-17 ENCOUNTER — Emergency Department (HOSPITAL_COMMUNITY): Payer: Medicaid Other

## 2021-02-17 ENCOUNTER — Other Ambulatory Visit: Payer: Self-pay

## 2021-02-17 DIAGNOSIS — R079 Chest pain, unspecified: Secondary | ICD-10-CM | POA: Diagnosis not present

## 2021-02-17 DIAGNOSIS — R1110 Vomiting, unspecified: Secondary | ICD-10-CM | POA: Diagnosis not present

## 2021-02-17 DIAGNOSIS — Z79899 Other long term (current) drug therapy: Secondary | ICD-10-CM | POA: Diagnosis not present

## 2021-02-17 DIAGNOSIS — Z7984 Long term (current) use of oral hypoglycemic drugs: Secondary | ICD-10-CM | POA: Insufficient documentation

## 2021-02-17 DIAGNOSIS — Z794 Long term (current) use of insulin: Secondary | ICD-10-CM | POA: Diagnosis not present

## 2021-02-17 DIAGNOSIS — Z7982 Long term (current) use of aspirin: Secondary | ICD-10-CM | POA: Insufficient documentation

## 2021-02-17 DIAGNOSIS — K5900 Constipation, unspecified: Secondary | ICD-10-CM | POA: Diagnosis not present

## 2021-02-17 DIAGNOSIS — I110 Hypertensive heart disease with heart failure: Secondary | ICD-10-CM | POA: Diagnosis not present

## 2021-02-17 DIAGNOSIS — M545 Low back pain, unspecified: Secondary | ICD-10-CM

## 2021-02-17 DIAGNOSIS — Z8616 Personal history of COVID-19: Secondary | ICD-10-CM | POA: Insufficient documentation

## 2021-02-17 DIAGNOSIS — E1165 Type 2 diabetes mellitus with hyperglycemia: Secondary | ICD-10-CM | POA: Diagnosis not present

## 2021-02-17 DIAGNOSIS — I5032 Chronic diastolic (congestive) heart failure: Secondary | ICD-10-CM | POA: Insufficient documentation

## 2021-02-17 DIAGNOSIS — Z87891 Personal history of nicotine dependence: Secondary | ICD-10-CM | POA: Diagnosis not present

## 2021-02-17 DIAGNOSIS — R109 Unspecified abdominal pain: Secondary | ICD-10-CM | POA: Insufficient documentation

## 2021-02-17 LAB — BASIC METABOLIC PANEL
Anion gap: 8 (ref 5–15)
BUN: 9 mg/dL (ref 6–20)
CO2: 20 mmol/L — ABNORMAL LOW (ref 22–32)
Calcium: 9.4 mg/dL (ref 8.9–10.3)
Chloride: 110 mmol/L (ref 98–111)
Creatinine, Ser: 0.93 mg/dL (ref 0.44–1.00)
GFR, Estimated: >60 mL/min (ref >60)
Glucose, Bld: 265 mg/dL — ABNORMAL HIGH (ref 70–99)
Potassium: 3.6 mmol/L (ref 3.5–5.1)
Sodium: 138 mmol/L (ref 135–145)

## 2021-02-17 LAB — CBC
HCT: 37.5 % (ref 36.0–46.0)
Hemoglobin: 11.6 g/dL — ABNORMAL LOW (ref 12.0–15.0)
MCH: 28 pg (ref 26.0–34.0)
MCHC: 30.9 g/dL (ref 30.0–36.0)
MCV: 90.4 fL (ref 80.0–100.0)
Platelets: 201 10*3/uL (ref 150–400)
RBC: 4.15 MIL/uL (ref 3.87–5.11)
RDW: 13.9 % (ref 11.5–15.5)
WBC: 8.7 10*3/uL (ref 4.0–10.5)
nRBC: 0 % (ref 0.0–0.2)

## 2021-02-17 LAB — TROPONIN I (HIGH SENSITIVITY)
Troponin I (High Sensitivity): 4 ng/L (ref <18)
Troponin I (High Sensitivity): 5 ng/L (ref <18)

## 2021-02-17 LAB — I-STAT BETA HCG BLOOD, ED (MC, WL, AP ONLY): I-stat hCG, quantitative: <5 m[IU]/mL (ref <5)

## 2021-02-17 MED ORDER — ONDANSETRON 4 MG PO TBDP: 4.0000 mg | ORAL_TABLET | Freq: Three times a day (TID) | ORAL | 0 refills | Status: DC | PRN

## 2021-02-17 MED ORDER — PROCHLORPERAZINE EDISYLATE 10 MG/2ML IJ SOLN
10.0000 mg | Freq: Once | INTRAMUSCULAR | Status: AC
  Administered 2021-02-17: 10 mg via INTRAVENOUS
  Filled 2021-02-17: qty 2

## 2021-02-17 MED ORDER — SODIUM CHLORIDE 0.9 % IV BOLUS
500.0000 mL | Freq: Once | INTRAVENOUS | Status: AC
  Administered 2021-02-17: 500 mL via INTRAVENOUS

## 2021-02-17 MED ORDER — HYDROMORPHONE HCL 1 MG/ML IJ SOLN
1.0000 mg | Freq: Once | INTRAMUSCULAR | Status: AC
Start: 2021-02-17 — End: 2021-02-17
  Administered 2021-02-17: 1 mg via INTRAVENOUS
  Filled 2021-02-17: qty 1

## 2021-02-17 MED ORDER — OXYCODONE-ACETAMINOPHEN 5-325 MG PO TABS: 1.0000 | ORAL_TABLET | Freq: Three times a day (TID) | ORAL | 0 refills | Status: DC | PRN

## 2021-02-17 NOTE — ED Provider Notes (Signed)
Physical Exam  BP 137/62   Pulse 93   Temp 98.2 F (36.8 C) (Oral)   Resp 16   LMP 03/24/2017   SpO2 100%   Physical Exam Vitals and nursing note reviewed.  Constitutional:      General: She is not in acute distress.    Appearance: She is not ill-appearing, toxic-appearing or diaphoretic.  Eyes:     General: No scleral icterus.       Right eye: No discharge.        Left eye: No discharge.     Extraocular Movements: Extraocular movements intact.     Pupils: Pupils are equal, round, and reactive to light.  Cardiovascular:     Rate and Rhythm: Normal rate.  Pulmonary:     Effort: Pulmonary effort is normal.  Abdominal:     Palpations: Abdomen is soft.     Tenderness: There is no abdominal tenderness.  Musculoskeletal:     Cervical back: Normal range of motion and neck supple. No rigidity.     Lumbar back: No swelling, edema, deformity, signs of trauma, lacerations, spasms, tenderness or bony tenderness.     Comments: No midline tenderness or deformity to cervical, thoracic, or lumbar spine.   Skin:    General: Skin is warm and dry.  Neurological:     General: No focal deficit present.     Mental Status: She is alert and oriented to person, place, and time.     GCS: GCS eye subscore is 4. GCS verbal subscore is 5. GCS motor subscore is 6.     Cranial Nerves: No cranial nerve deficit or facial asymmetry.     Sensory: Sensation is intact.     Motor: No weakness, tremor, seizure activity or pronator drift.     Coordination: Romberg sign negative. Finger-Nose-Finger Test normal.     Comments: CN II-XII intact; performed in supine position, +5 strength to bilateral upper extremities, +5 strength to dorsiflexion and plantarflexion, patient able to left both legs against gravity and hold each there without difficulty   Patient normally ambulates with walker at baseline  Psychiatric:        Behavior: Behavior is cooperative.    ED Course/Procedures     Procedures  MDM    Care of patient assumed from Dr. Alvino Chapel at 1000.  Agree with history, physical exam and plan.  See their note for further details. Briefly, patient presents with left lower back pain, pain has been present over the last week and a half.  Patient also endorses vomiting.  Patient denies any radiation of pain, dysuria.  Patient was evaluated on Friday for the same complain, patient was prescribed Phenergan and muscle relaxers for symptoms.  States that neither of these medications are working at home.  Lab work shows mild hyperglycemia, no signs of DKA.  Patient received Compazine and pain medication.  Will reassess patient.  Patient symptoms have improved plan to discharge with short course of pain medication.  Patient reports marked improvement in her symptoms after receiving medication.  Passed p.o. challenge.  Patient has no focal neurological deficit on physical exam.    Plan to discharge patient on short course of pain medication and Zofran ODT.  Patient has listed history of allergy to codeine reports that when she takes that she developed aching sensation.  Patient received while at here today with no signs of allergic reaction.  Patient was advised to take medication with Benadryl and signs/symptoms of anaphylactic reaction described to the  patient.Patient was advised to stop taking Phenergan.  We will have patient follow-up with primary care provider if symptoms continue.     Loni Beckwith, PA-C 02/17/21 1116    Lajean Saver, MD 02/22/21 [phone]

## 2021-02-17 NOTE — Discharge Instructions (Addendum)
You came to the emergency department today to be evaluated for your vomiting and back pain.  Your lab work Physical exam was reassuring.  I have given you prescription of Zofran to use for nausea and vomiting.  I have given you prescription of Percocet to help with your pain.  Please follow-up with your primary care provider if your symptoms continue.  Today you received medications that may make you sleepy or impair your ability to make decisions.  For the next 24 hours please do not drive, operate heavy machinery, care for a small child with out another adult present, or perform any activities that may cause harm to you or someone else if you were to fall asleep or be impaired.   You are being prescribed a medication which may make you sleepy. Please follow up of listed precautions for at least 24 hours after taking one dose.   Get help right away if: You develop new bowel or bladder control problems. You have unusual weakness or numbness in your arms or legs. You develop nausea or vomiting. You develop abdominal pain. You feel faint

## 2021-02-17 NOTE — ED Notes (Signed)
SW present at [facility] for d/c.

## 2021-02-17 NOTE — ED Notes (Addendum)
Social Worker  Notified about patients situation with getting home and not having walker. Currently tolerating crackers and soda.

## 2021-02-17 NOTE — ED Notes (Signed)
Reports chest pain only occurs after vomiting and it gets tight.  Complains of stomach pain and side pain.

## 2021-02-17 NOTE — ED Provider Notes (Signed)
[facility] EMERGENCY DEPARTMENT Provider Note   CSN: [id number] Arrival date & time: 02/17/21  0431     History Chief Complaint  Patient presents with   Back Pain   Chest Pain    [name] is a 50 y.o. female.   Back Pain Associated symptoms: abdominal pain and chest pain   Associated symptoms: no fever and no weakness   Chest Pain Associated symptoms: abdominal pain, back pain and vomiting   Associated symptoms: no fever, no shortness of breath and no weakness   Patient presents with left lower back pain.  Began around a week and a half ago.  Was seen on Friday for the same with today being Wednesday.  States continued pain.  States continues to vomit.  States she is also had some constipation.  States really not having bowel movements.  No fevers or chills.  No rash.  No trauma.  Does not tend to get back pain.  No radiation down the back.  No dysuria.  States she was given Phenergan and muscle relaxers for home.  States neither of them worked.    Past Medical History:  Diagnosis Date   Acute renal failure (HCC)     Intractable nausea vomiting secondary to diabetic gastroparesis causing dehydration and acute renal failure /notes 04/01/2013   Anginal pain ([place])    Anxiety    Bell's palsy 08/09/2010   Bicornuate uterus    CAP (community acquired pneumonia) 10/2011   Archie Endo 11/08/2011   Chest pain 01/13/2014   CHF (congestive heart failure) ([place])    Diabetic gastroparesis associated with type 2 diabetes mellitus ([place])    this is presumed diagnoses, not confirmed by any studies.    Eczema    Family history of malignant neoplasm of breast    Family history of malignant neoplasm of ovary    GERD (gastroesophageal reflux disease)    High cholesterol    Hypertension    Liver lesion 08/13/2019   Liver mass 2014   biopsied 03/2013 at [facility], not malignant.  is to undergo radiologic ablation of the mass in sept/October 2014.    Lupus ([place])     Migraines    "maybe a couple times/yr" (04/01/2013)   Obesity    Obstructive sleep apnea on CPAP 2011   Oxygen at nights   SJOGREN'S SYNDROME 08/09/2010   SLE (systemic lupus erythematosus) ([place])    Archie Endo 12/01/2010   Stroke ([place]) 2010; 10/2012   "left side is still weak from it, never fully regained full strength; no additions from stroke 10/2012"    Patient Active Problem List   Diagnosis Date Noted   Bacteremia    Pneumonia due to COVID-19 virus 01/29/2020   Pressure injury of skin 08/30/2019   Physical debility 08/29/2019   Obesity hypoventilation syndrome ([place]) 08/23/2019   Skin breakdown 08/23/2019   Essential hypertension 08/23/2019   Liver hemorrhage 08/23/2019   Acute blood loss anemia 08/13/2019   Hepatic adenoma    Hemoperitoneum    Diabetic acidosis without coma ([place])    Multifocal pneumonia 09/23/2017   Acute respiratory failure ([place]) 09/21/2017   CHF exacerbation ([place]) 09/20/2017   Costochondritis 06/06/2017   Chest pain 06/04/2017   Diabetes ([place]) 09/19/2015   Radiculopathy of cervical spine    Severe headache 03/05/2014   Family history of malignant neoplasm of breast    Family history of malignant neoplasm of ovary    Intractable nausea and vomiting 11/11/2013   Septate uterus  09/08/2013   Diabetic gastroparesis ([place]) 04/28/2013   Constipation due to pain medication 04/21/2013   Odynophagia 04/04/2013   Liver masses 02/28/2013   Reflux esophagitis 02/26/2013   Morbid obesity ([place]) 10/10/2012   Chronic diastolic CHF (congestive heart failure) ([place]) 10/09/2012   Chronic respiratory failure with hypoxia ([place]) 10/09/2012   Clinical polymyositis syndrome ([place]) 09/06/2012   Mononeuritis of lower limb 01/12/2012   HTN (hypertension) 02/77/4128   Chronic systolic CHF (congestive heart failure) ([place]) 11/08/2011   HLD (hyperlipidemia) 07/19/2011   Bell's palsy 08/09/2010   SLE 08/09/2010   SJOGREN'S SYNDROME 08/09/2010    Past Surgical History:  Procedure  Laterality Date   BREAST CYST EXCISION Left 08/2005   epidermoid   CARDIAC CATHETERIZATION  02/07/12   ECTOPIC PREGNANCY SURGERY  1999   ECTOPIC PREGNANCY SURGERY  1999   ESOPHAGOGASTRODUODENOSCOPY N/A 02/26/2013   Procedure: ESOPHAGOGASTRODUODENOSCOPY (EGD);  Surgeon: Irene Shipper, MD;  Location: [facility] ENDOSCOPY;  Service: Endoscopy;  Laterality: N/A;   ESOPHAGOGASTRODUODENOSCOPY N/A 03/02/2015   Procedure: ESOPHAGOGASTRODUODENOSCOPY (EGD);  Surgeon: Milus Banister, MD;  Location: [place];  Service: Endoscopy;  Laterality: N/A;   HERNIA REPAIR     LEFT AND RIGHT HEART CATHETERIZATION WITH CORONARY ANGIOGRAM N/A 02/07/2012   Procedure: LEFT AND RIGHT HEART CATHETERIZATION WITH CORONARY ANGIOGRAM;  Surgeon: Laverda Page, MD;  Location: [facility] CATH LAB;  Service: Cardiovascular;  Laterality: N/A;   LIVER BIOPSY  03/2013   liver mass/medical hx noted above   MUSCLE BIOPSY     for lupus/notes 02/05/6766   UMBILICAL HERNIA REPAIR  7's     OB History     Gravida  2   Para  1   Term  1   Preterm      AB  1   Living  1      SAB      IAB      Ectopic  1   Multiple      Live Births  1           Family History  Problem Relation Age of Onset   Diabetes Mother    Asthma Mother    Urticaria Mother    Hypertension Mother    Breast cancer Maternal Aunt 44       currently 68   Stomach cancer Maternal Uncle        dx 69s; deceased   Prostate cancer Maternal Uncle        deceased 20s   Cancer Maternal Aunt        unk. primary   Cancer Maternal Aunt        unk. primary   Ovarian cancer Maternal Aunt        deceased 43s   Hypertension Other    Stroke Other    Arthritis Other    Ovarian cancer Sister 6       maternal half-sister; RAD51C positive   Cancer Paternal Aunt        unk. primary; deceased 53s   Prostate cancer Paternal Uncle        deceased 39s   Breast cancer Cousin        deceased 4s; daughter of mat uncle with stomach ca   Breast cancer Maternal  Aunt        deceased 77s   Asthma Brother    Asthma Brother    Allergic rhinitis Neg Hx    Angioedema Neg Hx     Social History   Tobacco  Use   Smoking status: Former    Packs/day: 0.50    Years: 3.00    Pack years: 1.50    Types: Cigarettes    Quit date: 06/02/1991    Years since quitting: 29.7   Smokeless tobacco: Never   Tobacco comments:    quit 28 yrs ago  Vaping Use   Vaping Use: Never used  Substance Use Topics   Alcohol use: No    Alcohol/week: 0.0 standard drinks   Drug use: No    Home Medications Prior to Admission medications   Medication Sig Start Date End Date Taking? Authorizing Provider  ADVAIR HFA 230-21 MCG/ACT inhaler Inhale 1-2 puffs into the lungs 2 (two) times daily. 12/20/20   [provider]  albuterol (PROVENTIL) (2.5 MG/3ML) 0.083% nebulizer solution Take 2.5 mg by nebulization every 6 (six) hours as needed for wheezing.    [provider]  albuterol (VENTOLIN HFA) 108 (90 Base) MCG/ACT inhaler Inhale 2 puffs into the lungs every 6 (six) hours as needed for wheezing or shortness of breath.    [provider]  ALPRAZolam Duanne Moron) 0.5 MG tablet Take 0.5 mg 2 (two) times daily as needed by mouth for anxiety or sleep.  04/19/17   [provider]  amLODipine (NORVASC) 10 MG tablet Take 1 tablet (10 mg total) by mouth daily. 01/01/21   Adrian Prows, MD  ARIPiprazole (ABILIFY) 5 MG tablet Take 5 mg by mouth daily. 12/08/20   [provider]  aspirin EC 81 MG tablet Take 81 mg by mouth daily. Swallow whole.    [provider]  azaTHIOprine (IMURAN) 50 MG tablet Take 150 mg by mouth in the morning and at bedtime.     [provider]  Azathioprine 75 MG TABS Take 150 mg by mouth 2 (two) times daily. 02/04/21   [provider]  B-D UF III MINI PEN NEEDLES 31G X 5 MM MISC USE AS DIRECTED TWICE A DAY Patient taking differently: 1 each by Other route in the morning and at bedtime. 10/31/18   Renato Shin,  MD  Belimumab (BENLYSTA) 200 MG/ML SOAJ Inject 200 mg into the skin once a week. Thursdays 12/19/19   [provider]  benzonatate (TESSALON) 100 MG capsule Take 1 capsule by mouth 3 (three) times daily as needed for cough. 05/23/19   [provider]  Blood Glucose Monitoring Suppl (ONETOUCH VERIO) w/Device KIT Test 4 times daily.  DX E11.9 04/27/20   Elayne Snare, MD  buPROPion (WELLBUTRIN XL) 300 MG 24 hr tablet Take 300 mg by mouth daily. 06/28/19   [provider]  carvedilol (COREG) 25 MG tablet Take 1.5 tablets (37.5 mg total) by mouth 2 (two) times daily with a meal. 01/01/21   Adrian Prows, MD  cetirizine (ZYRTEC) 10 MG tablet Take 10 mg by mouth daily as needed for allergies.     [provider]  clotrimazole-betamethasone (LOTRISONE) cream Apply 1 application topically 2 (two) times daily. Patient taking differently: Apply 1 application topically daily as needed (rash). 01/01/19   Renato Shin, MD  cromolyn (OPTICROM) 4 % ophthalmic solution Place 1 drop into both eyes 2 (two) times daily. 01/14/21   [provider]  cycloSPORINE (RESTASIS) 0.05 % ophthalmic emulsion Place 1 drop into both eyes in the morning, at noon, and at bedtime.     [provider]  diclofenac Sodium (VOLTAREN) 1 % GEL Apply 2 g topically 4 (four) times daily. Patient taking differently: Apply 2 g topically  daily as needed (pain). 09/18/19   Love, Ivan Anchors, PA-C  DULoxetine (CYMBALTA) 60 MG capsule Take 120 mg by mouth at bedtime. 08/31/20   [provider]  EASY TOUCH PEN NEEDLES 32G X 4 MM MISC USE TO INJECT INSULIN TWICE DAILY Patient taking differently: 1 each by Other route in the morning and at bedtime. 08/07/19   Renato Shin, MD  esomeprazole (NEXIUM) 40 MG capsule Take 1 capsule (40 mg total) by mouth daily. 02/12/21   Drenda Freeze, MD  famotidine (PEPCID) 20 MG tablet Take 20 mg by mouth 2 (two) times daily.    [provider]  fluticasone  (FLONASE) 50 MCG/ACT nasal spray Place 2 sprays into both nostrils continuous as needed for allergies or rhinitis.    [provider]  glucose blood (ONETOUCH VERIO) test strip Test 4 times daily DX E11.9 04/27/20   Elayne Snare, MD  glucose blood test strip Test 4 times daily DX E11.9 04/27/20   Elayne Snare, MD  HUMALOG KWIKPEN 100 UNIT/ML KwikPen Inject 10-20 Units into the skin 3 (three) times daily. 12/20/20   [provider]  hydroxychloroquine (PLAQUENIL) 200 MG tablet Take 200 mg by mouth 2 (two) times daily.    [provider]  Insulin NPH, Human,, Isophane, (HUMULIN N KWIKPEN) 100 UNIT/ML Kiwkpen Inject 2.2 mLs (220 Units total) into the skin every morning 12/22/20   Renato Shin, MD  Insulin Syringe-Needle U-100 (EASY TOUCH INSULIN SYRINGE) 31G X 5/16" 1 ML MISC USE AS DIRECTED THREE TIMES A DAY Patient taking differently: 1 each by Other route in the morning, at noon, and at bedtime. 10/12/18   Renato Shin, MD  isosorbide-hydrALAZINE (BIDIL) 20-37.5 MG tablet TAKE TWO (2) TABLETS BY MOUTH THREE TIMES DAILY 01/01/21   Adrian Prows, MD  Lancets [facility] ULTRASOFT) lancets Test 4 times daily DX E11.9 04/27/20   Elayne Snare, MD  levothyroxine (SYNTHROID) 50 MCG tablet Take 1 tablet (50 mcg total) by mouth daily at 6 (six) AM. 08/30/19   Raiford Noble Latif, DO  liraglutide (VICTOZA) 18 MG/3ML SOPN Inject 1.8 mg into the skin every morning. 12/22/20   Renato Shin, MD  megestrol (MEGACE) 40 MG tablet Take 40 mg by mouth daily. 01/30/15   [provider]  metFORMIN (GLUCOPHAGE-XR) 500 MG 24 hr tablet Take 2 tablets (1,000 mg total) by mouth 2 (two) times daily. 02/02/21   Renato Shin, MD  Multiple Vitamin (MULTIVITAMIN WITH MINERALS) TABS tablet Take 1 tablet by mouth daily. 08/30/19   Raiford Noble Latif, DO  ondansetron (ZOFRAN ODT) 4 MG disintegrating tablet 69m ODT q4 hours prn nausea/vomit 02/12/21   YDrenda Freeze MD  oxybutynin (DITROPAN XL) 15 MG 24 hr tablet  Take 15 mg by mouth daily.  08/30/17   [provider]  polyethylene glycol (MIRALAX / GLYCOLAX) 17 g packet Take 17 g by mouth daily. 02/12/21   YDrenda Freeze MD  pravastatin (PRAVACHOL) 10 MG tablet Take 1 tablet (10 mg total) by mouth daily after supper. 01/01/21   GAdrian Prows MD  predniSONE (DELTASONE) 1 MG tablet Take 1 mg by mouth 2 (two) times daily. 04/21/17   [provider]  pregabalin (LYRICA) 75 MG capsule Take 75 mg 3 (three) times daily by mouth.  01/28/15   [provider]  QUEtiapine (SEROQUEL) 300 MG tablet Take 300 mg by mouth at bedtime. 01/08/21   [provider]  sacubitril-valsartan (ENTRESTO) 97-103 MG Take 1 tablet by mouth 2 (two) times daily.  01/21/20   Adrian Prows, MD  spironolactone (ALDACTONE) 50 MG tablet Take 1 tablet (50 mg total) by mouth daily. 01/01/21   Adrian Prows, MD  sucralfate (CARAFATE) 1 GM/10ML suspension Take 10 mLs by mouth 2 (two) times daily. 12/09/20   [provider]  topiramate (TOPAMAX) 25 MG tablet Take 75 mg by mouth at bedtime. 03/17/16   [provider]  torsemide (DEMADEX) 20 MG tablet Take 20 mg by mouth daily as needed for fluid. 09/12/19   [provider]  traMADol (ULTRAM) 50 MG tablet Take 1 tablet (50 mg total) by mouth every 6 (six) hours as needed for moderate pain or severe pain. 06/09/20   Pete Pelt, PA-C    Allergies    Metoclopramide, Codeine, Hydrocodone, Penicillins, and Percocet [oxycodone-acetaminophen]  Review of Systems   Review of Systems  Constitutional:  Negative for appetite change and fever.  HENT:  Negative for congestion.   Respiratory:  Negative for shortness of breath.   Cardiovascular:  Positive for chest pain.  Gastrointestinal:  Positive for abdominal pain, constipation and vomiting.  Genitourinary:  Positive for flank pain.  Musculoskeletal:  Positive for back pain.  Skin:  Negative for rash.  Neurological:  Negative for weakness.   Psychiatric/Behavioral:  Negative for confusion.    Physical Exam Updated Vital Signs BP 137/62   Pulse 93   Temp 98.2 F (36.8 C) (Oral)   Resp 16   LMP 03/24/2017   SpO2 100%   Physical Exam Vitals and nursing note reviewed.  Constitutional:      Appearance: She is obese.  HENT:     Head: Atraumatic.  Eyes:     Pupils: Pupils are equal, round, and reactive to light.  Cardiovascular:     Rate and Rhythm: Normal rate and regular rhythm.  Pulmonary:     Breath sounds: No wheezing or rhonchi.  Chest:     Chest wall: No tenderness.  Abdominal:     Tenderness: There is no abdominal tenderness.  Musculoskeletal:     Right lower leg: No tenderness.     Left lower leg: No tenderness.     Comments: Tenderness to left lower back.  No midline tenderness.  No rash.  No vesicles.  No pain with straight leg raise on left  Skin:    Findings: No rash.  Neurological:     Mental Status: She is alert and oriented to person, place, and time.    ED Results / Procedures / Treatments   Labs (all labs ordered are listed, but only abnormal results are displayed) Labs Reviewed  BASIC METABOLIC PANEL - Abnormal; Notable for the following components:      Result Value   CO2 20 (*)    Glucose, Bld 265 (*)    All other components within normal limits  CBC - Abnormal; Notable for the following components:   Hemoglobin 11.6 (*)    All other components within normal limits  I-STAT BETA HCG BLOOD, ED ([facility], [facility], [facility] ONLY)  TROPONIN I (HIGH SENSITIVITY)  TROPONIN I (HIGH SENSITIVITY)    EKG EKG Interpretation  Date/Time:  Wednesday February 17 2021 04:39:50 EDT Ventricular Rate:  102 PR Interval:  156 QRS Duration: 90 QT Interval:  306 QTC Calculation: 398 R Axis:   73 Text Interpretation: Sinus tachycardia Low voltage QRS Nonspecific T wave abnormality Abnormal ECG Confirmed by Davonna Belling [phone]) on 02/17/2021 8:12:13 AM  Radiology DG Chest 2 View  Result Date:  02/17/2021 CLINICAL DATA:  Upper back pain radiating to the chest EXAM: CHEST - 2 VIEW COMPARISON:  02/12/2021 FINDINGS: Cardiomegaly. Stable mediastinal contours. Interstitial crowding in the setting of mildly low lung volumes. Borderline vascular congestion on the lateral view. No edema, effusion, or consolidation. IMPRESSION: Cardiomegaly and borderline vascular congestion. Electronically Signed   By: Monte Fantasia M.D.   On: 02/17/2021 05:11   DG Abd 2 Views  Result Date: 02/17/2021 CLINICAL DATA:  Abdominal pain and vomiting for several days EXAM: ABDOMEN - 2 VIEW COMPARISON:  02/12/2021 FINDINGS: Supine and decubitus views were obtained. Scattered large and small bowel gas is noted. No findings to suggest free intraperitoneal air are noted. No abnormal mass or abnormal calcifications are noted. No acute bony abnormality is seen. IMPRESSION: No acute abnormality noted. Electronically Signed   By: Inez Catalina M.D.   On: 02/17/2021 09:05    Procedures Procedures   Medications Ordered in ED Medications  prochlorperazine (COMPAZINE) injection 10 mg (10 mg Intravenous Given 02/17/21 0834)  sodium chloride 0.9 % bolus 500 mL (0 mLs Intravenous Stopped 02/17/21 0916)  HYDROmorphone (DILAUDID) injection 1 mg (1 mg Intravenous Given 02/17/21 0909)    ED Course  I have reviewed the triage vital signs and the nursing notes.  Pertinent labs & imaging results that were available during my care of the patient were reviewed by me and considered in my medical decision making (see chart for details).    MDM Rules/Calculators/A&P                           Patient with right-sided flank/low back pain.  Seen recently for same.  CT scan done at that time and reassuring.  Mild hyperglycemia.  Also has had vomiting.  Pain medicine and antiemetics given.  Care turned over to [facility]. Final Clinical Impression(s) / ED Diagnoses Final diagnoses:  Vomiting  Acute left-sided low back pain without sciatica     Rx / DC Orders ED Discharge Orders     None        Davonna Belling, MD 02/17/21 [phone]

## 2021-02-17 NOTE — ED Notes (Signed)
ED Provider at bedside. 

## 2021-02-17 NOTE — ED Triage Notes (Signed)
Patient with upper back pain that radiates to her chest, shortness of breath with ambulation, nausea and vomiting.  All symptoms started two weeks ago.

## 2021-02-18 ENCOUNTER — Emergency Department (HOSPITAL_COMMUNITY): Payer: Medicaid Other

## 2021-02-18 ENCOUNTER — Inpatient Hospital Stay (HOSPITAL_COMMUNITY)
Admission: EM | Admit: 2021-02-18 | Discharge: 2021-02-25 | DRG: 392 | Disposition: A | Discharge status: Home Health | Payer: Medicaid Other | Attending: Internal Medicine | Admitting: Internal Medicine

## 2021-02-18 DIAGNOSIS — M3500 Sicca syndrome, unspecified: Secondary | ICD-10-CM | POA: Diagnosis present

## 2021-02-18 DIAGNOSIS — Z880 Allergy status to penicillin: Secondary | ICD-10-CM

## 2021-02-18 DIAGNOSIS — I10 Essential (primary) hypertension: Secondary | ICD-10-CM

## 2021-02-18 DIAGNOSIS — K219 Gastro-esophageal reflux disease without esophagitis: Secondary | ICD-10-CM | POA: Diagnosis present

## 2021-02-18 DIAGNOSIS — Z823 Family history of stroke: Secondary | ICD-10-CM

## 2021-02-18 DIAGNOSIS — Z7984 Long term (current) use of oral hypoglycemic drugs: Secondary | ICD-10-CM

## 2021-02-18 DIAGNOSIS — Z803 Family history of malignant neoplasm of breast: Secondary | ICD-10-CM

## 2021-02-18 DIAGNOSIS — M329 Systemic lupus erythematosus, unspecified: Secondary | ICD-10-CM | POA: Diagnosis present

## 2021-02-18 DIAGNOSIS — I5032 Chronic diastolic (congestive) heart failure: Secondary | ICD-10-CM | POA: Diagnosis present

## 2021-02-18 DIAGNOSIS — I69392 Facial weakness following cerebral infarction: Secondary | ICD-10-CM

## 2021-02-18 DIAGNOSIS — Z8041 Family history of malignant neoplasm of ovary: Secondary | ICD-10-CM

## 2021-02-18 DIAGNOSIS — Z888 Allergy status to other drugs, medicaments and biological substances status: Secondary | ICD-10-CM

## 2021-02-18 DIAGNOSIS — I609 Nontraumatic subarachnoid hemorrhage, unspecified: Secondary | ICD-10-CM

## 2021-02-18 DIAGNOSIS — R1310 Dysphagia, unspecified: Secondary | ICD-10-CM | POA: Diagnosis present

## 2021-02-18 DIAGNOSIS — K5900 Constipation, unspecified: Secondary | ICD-10-CM | POA: Diagnosis present

## 2021-02-18 DIAGNOSIS — G43009 Migraine without aura, not intractable, without status migrainosus: Secondary | ICD-10-CM

## 2021-02-18 DIAGNOSIS — R519 Headache, unspecified: Secondary | ICD-10-CM

## 2021-02-18 DIAGNOSIS — E860 Dehydration: Secondary | ICD-10-CM | POA: Diagnosis present

## 2021-02-18 DIAGNOSIS — I152 Hypertension secondary to endocrine disorders: Secondary | ICD-10-CM

## 2021-02-18 DIAGNOSIS — Z800 Family history of malignant neoplasm of digestive organs: Secondary | ICD-10-CM

## 2021-02-18 DIAGNOSIS — E7800 Pure hypercholesterolemia, unspecified: Secondary | ICD-10-CM | POA: Diagnosis present

## 2021-02-18 DIAGNOSIS — E1159 Type 2 diabetes mellitus with other circulatory complications: Secondary | ICD-10-CM

## 2021-02-18 DIAGNOSIS — R55 Syncope and collapse: Secondary | ICD-10-CM | POA: Insufficient documentation

## 2021-02-18 DIAGNOSIS — Z6841 Body Mass Index (BMI) 40.0 and over, adult: Secondary | ICD-10-CM

## 2021-02-18 DIAGNOSIS — Z794 Long term (current) use of insulin: Secondary | ICD-10-CM

## 2021-02-18 DIAGNOSIS — Z79899 Other long term (current) drug therapy: Secondary | ICD-10-CM

## 2021-02-18 DIAGNOSIS — R509 Fever, unspecified: Secondary | ICD-10-CM

## 2021-02-18 DIAGNOSIS — Z8249 Family history of ischemic heart disease and other diseases of the circulatory system: Secondary | ICD-10-CM

## 2021-02-18 DIAGNOSIS — Z885 Allergy status to narcotic agent status: Secondary | ICD-10-CM

## 2021-02-18 DIAGNOSIS — E876 Hypokalemia: Secondary | ICD-10-CM | POA: Diagnosis not present

## 2021-02-18 DIAGNOSIS — R112 Nausea with vomiting, unspecified: Principal | ICD-10-CM | POA: Diagnosis present

## 2021-02-18 DIAGNOSIS — Z825 Family history of asthma and other chronic lower respiratory diseases: Secondary | ICD-10-CM

## 2021-02-18 DIAGNOSIS — E785 Hyperlipidemia, unspecified: Secondary | ICD-10-CM | POA: Diagnosis present

## 2021-02-18 DIAGNOSIS — E1169 Type 2 diabetes mellitus with other specified complication: Secondary | ICD-10-CM

## 2021-02-18 DIAGNOSIS — I5042 Chronic combined systolic (congestive) and diastolic (congestive) heart failure: Secondary | ICD-10-CM | POA: Diagnosis present

## 2021-02-18 DIAGNOSIS — I110 Hypertensive heart disease with heart failure: Secondary | ICD-10-CM | POA: Diagnosis present

## 2021-02-18 DIAGNOSIS — I69354 Hemiplegia and hemiparesis following cerebral infarction affecting left non-dominant side: Secondary | ICD-10-CM

## 2021-02-18 DIAGNOSIS — Z833 Family history of diabetes mellitus: Secondary | ICD-10-CM

## 2021-02-18 DIAGNOSIS — G4733 Obstructive sleep apnea (adult) (pediatric): Secondary | ICD-10-CM | POA: Diagnosis present

## 2021-02-18 DIAGNOSIS — R948 Abnormal results of function studies of other organs and systems: Secondary | ICD-10-CM | POA: Diagnosis present

## 2021-02-18 DIAGNOSIS — Z20822 Contact with and (suspected) exposure to covid-19: Secondary | ICD-10-CM | POA: Diagnosis present

## 2021-02-18 DIAGNOSIS — Z87891 Personal history of nicotine dependence: Secondary | ICD-10-CM

## 2021-02-18 DIAGNOSIS — D849 Immunodeficiency, unspecified: Secondary | ICD-10-CM | POA: Diagnosis present

## 2021-02-18 DIAGNOSIS — K2900 Acute gastritis without bleeding: Secondary | ICD-10-CM | POA: Diagnosis present

## 2021-02-18 DIAGNOSIS — Z7982 Long term (current) use of aspirin: Secondary | ICD-10-CM

## 2021-02-18 LAB — COMPREHENSIVE METABOLIC PANEL
ALT: 12 U/L (ref 0–44)
AST: 16 U/L (ref 15–41)
Albumin: 3.8 g/dL (ref 3.5–5.0)
Alkaline Phosphatase: 92 U/L (ref 38–126)
Anion gap: 7 (ref 5–15)
BUN: 7 mg/dL (ref 6–20)
CO2: 19 mmol/L — ABNORMAL LOW (ref 22–32)
Calcium: 9.6 mg/dL (ref 8.9–10.3)
Chloride: 110 mmol/L (ref 98–111)
Creatinine, Ser: 0.86 mg/dL (ref 0.44–1.00)
GFR, Estimated: >60 mL/min (ref >60)
Glucose, Bld: 252 mg/dL — ABNORMAL HIGH (ref 70–99)
Potassium: 3.6 mmol/L (ref 3.5–5.1)
Sodium: 136 mmol/L (ref 135–145)
Total Bilirubin: 0.7 mg/dL (ref 0.3–1.2)
Total Protein: 7.2 g/dL (ref 6.5–8.1)

## 2021-02-18 LAB — DIFFERENTIAL
Abs Immature Granulocytes: 0.03 10*3/uL (ref 0.00–0.07)
Basophils Absolute: 0 10*3/uL (ref 0.0–0.1)
Basophils Relative: 0 %
Eosinophils Absolute: 0.2 10*3/uL (ref 0.0–0.5)
Eosinophils Relative: 3 %
Immature Granulocytes: 0 %
Lymphocytes Relative: 26 %
Lymphs Abs: 2.5 10*3/uL (ref 0.7–4.0)
Monocytes Absolute: 0.6 10*3/uL (ref 0.1–1.0)
Monocytes Relative: 6 %
Neutro Abs: 6.2 10*3/uL (ref 1.7–7.7)
Neutrophils Relative %: 65 %

## 2021-02-18 LAB — CBC
HCT: 38.9 % (ref 36.0–46.0)
Hemoglobin: 12.3 g/dL (ref 12.0–15.0)
MCH: 28.2 pg (ref 26.0–34.0)
MCHC: 31.6 g/dL (ref 30.0–36.0)
MCV: 89.2 fL (ref 80.0–100.0)
Platelets: 230 10*3/uL (ref 150–400)
RBC: 4.36 MIL/uL (ref 3.87–5.11)
RDW: 13.8 % (ref 11.5–15.5)
WBC: 9.6 10*3/uL (ref 4.0–10.5)
nRBC: 0 % (ref 0.0–0.2)

## 2021-02-18 LAB — CBG MONITORING, ED: Glucose-Capillary: 234 mg/dL — ABNORMAL HIGH (ref 70–99)

## 2021-02-18 LAB — I-STAT CHEM 8, ED
BUN: 7 mg/dL (ref 6–20)
Calcium, Ion: 1.21 mmol/L (ref 1.15–1.40)
Chloride: 109 mmol/L (ref 98–111)
Creatinine, Ser: 0.7 mg/dL (ref 0.44–1.00)
Glucose, Bld: 251 mg/dL — ABNORMAL HIGH (ref 70–99)
HCT: 39 % (ref 36.0–46.0)
Hemoglobin: 13.3 g/dL (ref 12.0–15.0)
Potassium: 3.6 mmol/L (ref 3.5–5.1)
Sodium: 141 mmol/L (ref 135–145)
TCO2: 19 mmol/L — ABNORMAL LOW (ref 22–32)

## 2021-02-18 LAB — APTT: aPTT: 23 seconds — ABNORMAL LOW (ref 24–36)

## 2021-02-18 LAB — PROTIME-INR
INR: 1 (ref 0.8–1.2)
Prothrombin Time: 13.5 seconds (ref 11.4–15.2)

## 2021-02-18 LAB — I-STAT BETA HCG BLOOD, ED (MC, WL, AP ONLY): I-stat hCG, quantitative: <5 m[IU]/mL (ref <5)

## 2021-02-18 MED ORDER — HYDRALAZINE HCL 20 MG/ML IJ SOLN
10.0000 mg | Freq: Four times a day (QID) | INTRAMUSCULAR | Status: DC | PRN
  Administered 2021-02-18: 10 mg via INTRAVENOUS
  Administered 2021-02-19: 20 mg via INTRAVENOUS
  Filled 2021-02-18 (×2): qty 1

## 2021-02-18 MED ORDER — DIPHENHYDRAMINE HCL 50 MG/ML IJ SOLN
25.0000 mg | Freq: Once | INTRAMUSCULAR | Status: AC
  Administered 2021-02-18: 25 mg via INTRAVENOUS
  Filled 2021-02-18: qty 1

## 2021-02-18 MED ORDER — SODIUM CHLORIDE 0.9 % IV BOLUS
500.0000 mL | Freq: Once | INTRAVENOUS | Status: AC
  Administered 2021-02-18: 500 mL via INTRAVENOUS

## 2021-02-18 MED ORDER — PANTOPRAZOLE SODIUM 40 MG IV SOLR
40.0000 mg | Freq: Once | INTRAVENOUS | Status: AC
  Administered 2021-02-18: 40 mg via INTRAVENOUS
  Filled 2021-02-18: qty 40

## 2021-02-18 MED ORDER — ONDANSETRON HCL 4 MG/2ML IJ SOLN
4.0000 mg | Freq: Once | INTRAMUSCULAR | Status: AC
  Administered 2021-02-18: 4 mg via INTRAVENOUS
  Filled 2021-02-18: qty 2

## 2021-02-18 MED ORDER — LABETALOL HCL 5 MG/ML IV SOLN
5.0000 mg | INTRAVENOUS | Status: DC | PRN
  Administered 2021-02-18: 10 mg via INTRAVENOUS
  Filled 2021-02-18: qty 4

## 2021-02-18 MED ORDER — PROCHLORPERAZINE EDISYLATE 10 MG/2ML IJ SOLN
10.0000 mg | Freq: Once | INTRAMUSCULAR | Status: AC
  Administered 2021-02-18: 10 mg via INTRAVENOUS
  Filled 2021-02-18: qty 2

## 2021-02-18 MED ORDER — LIDOCAINE-EPINEPHRINE (PF) 2 %-1:200000 IJ SOLN
20.0000 mL | Freq: Once | INTRAMUSCULAR | Status: AC
  Administered 2021-02-19: 20 mL
  Filled 2021-02-18: qty 20

## 2021-02-18 MED ORDER — LORAZEPAM 2 MG/ML IJ SOLN
2.0000 mg | Freq: Once | INTRAMUSCULAR | Status: AC
  Administered 2021-02-19: 2 mg via INTRAVENOUS
  Filled 2021-02-18: qty 1

## 2021-02-18 MED ORDER — SODIUM CHLORIDE 0.9% FLUSH
3.0000 mL | Freq: Once | INTRAVENOUS | Status: AC
  Administered 2021-02-18: 3 mL via INTRAVENOUS

## 2021-02-18 MED ORDER — FENTANYL CITRATE (PF) 100 MCG/2ML IJ SOLN
50.0000 ug | Freq: Once | INTRAMUSCULAR | Status: AC
Start: 2021-02-18 — End: 2021-02-18
  Administered 2021-02-18: 50 ug via INTRAVENOUS
  Filled 2021-02-18: qty 2

## 2021-02-18 MED ORDER — IOHEXOL 350 MG/ML SOLN
80.0000 mL | Freq: Once | INTRAVENOUS | Status: AC | PRN
  Administered 2021-02-18: 80 mL via INTRAVENOUS

## 2021-02-18 NOTE — ED Provider Notes (Signed)
[facility] EMERGENCY DEPARTMENT Provider Note   CSN: [id number] Arrival date & time: 02/18/21  1740     History No chief complaint on file.   [name] is a 50 y.o. female presenting for evalaution of cp, abd pain, and HA.   Patient states she has had issues with nausea, vomiting, abdominal pain for about a week.  She has been seen in the ER twice for this.  She is unable to tolerate p.o. still.  She also reports no bowel movement in the past week.  She reports chest pain, but states this is due to vomiting.  Due to the fact that she has not been able to eat or drink, she was dizzy and lightheaded today.  She called EMS for further evaluation, and when EMS arrived, patient was on the ground.  She does not remember how she got there or what happened.  She had a presumed syncopal event.  She denies vision changes prior to the episode.  She reports since today, she has had a gradual onset worsening headache which is different from previous headaches.  It is frontal.  No associated photophobia or phonophobia.  She has not taken anything for her symptoms as she cannot tolerate p.o.  She denies fevers, chills, fall, trauma, or injury.    Additional history taken chart reviewed.  Patient with a history of anxiety, Bell's palsy with residual left-sided facial droop, CHF, diabetes, gastroparesis, hypertension, liver lesion, lupus, migraines, obesity, Sjogren's, stroke with residual left-sided deficits   HPI     Past Medical History:  Diagnosis Date   Acute renal failure (HCC)     Intractable nausea vomiting secondary to diabetic gastroparesis causing dehydration and acute renal failure /notes 04/01/2013   Anginal pain ([place])    Anxiety    Bell's palsy 08/09/2010   Bicornuate uterus    CAP (community acquired pneumonia) 10/2011   Archie Endo 11/08/2011   Chest pain 01/13/2014   CHF (congestive heart failure) ([place])    Diabetic gastroparesis associated with type 2 diabetes mellitus  ([place])    this is presumed diagnoses, not confirmed by any studies.    Eczema    Family history of malignant neoplasm of breast    Family history of malignant neoplasm of ovary    GERD (gastroesophageal reflux disease)    High cholesterol    Hypertension    Liver lesion 08/13/2019   Liver mass 2014   biopsied 03/2013 at [facility], not malignant.  is to undergo radiologic ablation of the mass in sept/October 2014.    Lupus ([place])    Migraines    "maybe a couple times/yr" (04/01/2013)   Obesity    Obstructive sleep apnea on CPAP 2011   Oxygen at nights   SJOGREN'S SYNDROME 08/09/2010   SLE (systemic lupus erythematosus) ([place])    Archie Endo 12/01/2010   Stroke ([place]) 2010; 10/2012   "left side is still weak from it, never fully regained full strength; no additions from stroke 10/2012"    Patient Active Problem List   Diagnosis Date Noted   Bacteremia    Pneumonia due to COVID-19 virus 01/29/2020   Pressure injury of skin 08/30/2019   Physical debility 08/29/2019   Obesity hypoventilation syndrome ([place]) 08/23/2019   Skin breakdown 08/23/2019   Essential hypertension 08/23/2019   Liver hemorrhage 08/23/2019   Acute blood loss anemia 08/13/2019   Hepatic adenoma    Hemoperitoneum    Diabetic acidosis without coma ([place])    Multifocal  pneumonia 09/23/2017   Acute respiratory failure ([place]) 09/21/2017   CHF exacerbation ([place]) 09/20/2017   Costochondritis 06/06/2017   Chest pain 06/04/2017   Diabetes ([place]) 09/19/2015   Radiculopathy of cervical spine    Severe headache 03/05/2014   Family history of malignant neoplasm of breast    Family history of malignant neoplasm of ovary    Intractable nausea and vomiting 11/11/2013   Septate uterus 09/08/2013   Diabetic gastroparesis ([place]) 04/28/2013   Constipation due to pain medication 04/21/2013   Odynophagia 04/04/2013   Liver masses 02/28/2013   Reflux esophagitis 02/26/2013   Morbid obesity ([place]) 10/10/2012   Chronic diastolic CHF  (congestive heart failure) ([place]) 10/09/2012   Chronic respiratory failure with hypoxia ([place]) 10/09/2012   Clinical polymyositis syndrome ([place]) 09/06/2012   Mononeuritis of lower limb 01/12/2012   HTN (hypertension) 62/56/3893   Chronic systolic CHF (congestive heart failure) ([place]) 11/08/2011   HLD (hyperlipidemia) 07/19/2011   Bell's palsy 08/09/2010   SLE 08/09/2010   SJOGREN'S SYNDROME 08/09/2010    Past Surgical History:  Procedure Laterality Date   BREAST CYST EXCISION Left 08/2005   epidermoid   CARDIAC CATHETERIZATION  02/07/12   ECTOPIC PREGNANCY SURGERY  1999   ECTOPIC PREGNANCY SURGERY  1999   ESOPHAGOGASTRODUODENOSCOPY N/A 02/26/2013   Procedure: ESOPHAGOGASTRODUODENOSCOPY (EGD);  Surgeon: Irene Shipper, MD;  Location: [facility] ENDOSCOPY;  Service: Endoscopy;  Laterality: N/A;   ESOPHAGOGASTRODUODENOSCOPY N/A 03/02/2015   Procedure: ESOPHAGOGASTRODUODENOSCOPY (EGD);  Surgeon: Milus Banister, MD;  Location: [facility];  Service: Endoscopy;  Laterality: N/A;   HERNIA REPAIR     LEFT AND RIGHT HEART CATHETERIZATION WITH CORONARY ANGIOGRAM N/A 02/07/2012   Procedure: LEFT AND RIGHT HEART CATHETERIZATION WITH CORONARY ANGIOGRAM;  Surgeon: Laverda Page, MD;  Location: [facility] CATH LAB;  Service: Cardiovascular;  Laterality: N/A;   LIVER BIOPSY  03/2013   liver mass/medical hx noted above   MUSCLE BIOPSY     for lupus/notes 01/31/4286   UMBILICAL HERNIA REPAIR  79's     OB History     Gravida  2   Para  1   Term  1   Preterm      AB  1   Living  1      SAB      IAB      Ectopic  1   Multiple      Live Births  1           Family History  Problem Relation Age of Onset   Diabetes Mother    Asthma Mother    Urticaria Mother    Hypertension Mother    Breast cancer Maternal Aunt 30       currently 64   Stomach cancer Maternal Uncle        dx 55s; deceased   Prostate cancer Maternal Uncle        deceased 55s   Cancer Maternal Aunt        unk. primary    Cancer Maternal Aunt        unk. primary   Ovarian cancer Maternal Aunt        deceased 16s   Hypertension Other    Stroke Other    Arthritis Other    Ovarian cancer Sister 47       maternal half-sister; RAD51C positive   Cancer Paternal Aunt        unk. primary; deceased 49s   Prostate cancer Paternal Uncle  deceased 30s   Breast cancer Cousin        deceased 38s; daughter of mat uncle with stomach ca   Breast cancer Maternal Aunt        deceased 31s   Asthma Brother    Asthma Brother    Allergic rhinitis Neg Hx    Angioedema Neg Hx     Social History   Tobacco Use   Smoking status: Former    Packs/day: 0.50    Years: 3.00    Pack years: 1.50    Types: Cigarettes    Quit date: 06/02/1991    Years since quitting: 29.7   Smokeless tobacco: Never   Tobacco comments:    quit 28 yrs ago  Vaping Use   Vaping Use: Never used  Substance Use Topics   Alcohol use: No    Alcohol/week: 0.0 standard drinks   Drug use: No    Home Medications Prior to Admission medications   Medication Sig Start Date End Date Taking? Authorizing Provider  ADVAIR HFA 230-21 MCG/ACT inhaler Inhale 1-2 puffs into the lungs 2 (two) times daily. 12/20/20   [provider]  albuterol (PROVENTIL) (2.5 MG/3ML) 0.083% nebulizer solution Take 2.5 mg by nebulization every 6 (six) hours as needed for wheezing.    [provider]  albuterol (VENTOLIN HFA) 108 (90 Base) MCG/ACT inhaler Inhale 2 puffs into the lungs every 6 (six) hours as needed for wheezing or shortness of breath.    [provider]  ALPRAZolam Duanne Moron) 0.5 MG tablet Take 0.5 mg 2 (two) times daily as needed by mouth for anxiety or sleep.  04/19/17   [provider]  amLODipine (NORVASC) 10 MG tablet Take 1 tablet (10 mg total) by mouth daily. 01/01/21   Adrian Prows, MD  ARIPiprazole (ABILIFY) 5 MG tablet Take 5 mg by mouth daily. 12/08/20   [provider]  aspirin EC 81 MG tablet Take 81 mg by  mouth daily. Swallow whole.    [provider]  azaTHIOprine (IMURAN) 50 MG tablet Take 150 mg by mouth in the morning and at bedtime.     [provider]  Azathioprine 75 MG TABS Take 150 mg by mouth 2 (two) times daily. 02/04/21   [provider]  B-D UF III MINI PEN NEEDLES 31G X 5 MM MISC USE AS DIRECTED TWICE A DAY Patient taking differently: 1 each by Other route in the morning and at bedtime. 10/31/18   Renato Shin, MD  Belimumab (BENLYSTA) 200 MG/ML SOAJ Inject 200 mg into the skin once a week. Thursdays 12/19/19   [provider]  benzonatate (TESSALON) 100 MG capsule Take 1 capsule by mouth 3 (three) times daily as needed for cough. 05/23/19   [provider]  Blood Glucose Monitoring Suppl (ONETOUCH VERIO) w/Device KIT Test 4 times daily.  DX E11.9 04/27/20   Elayne Snare, MD  buPROPion (WELLBUTRIN XL) 300 MG 24 hr tablet Take 300 mg by mouth daily. 06/28/19   [provider]  carvedilol (COREG) 25 MG tablet Take 1.5 tablets (37.5 mg total) by mouth 2 (two) times daily with a meal. 01/01/21   Adrian Prows, MD  cetirizine (ZYRTEC) 10 MG tablet Take 10 mg by mouth daily as needed for allergies.     [provider]  clotrimazole-betamethasone (LOTRISONE) cream Apply 1 application topically 2 (two) times daily. Patient taking differently: Apply 1 application topically daily as needed (rash). 01/01/19   Renato Shin, MD  cromolyn (OPTICROM) 4 %  ophthalmic solution Place 1 drop into both eyes 2 (two) times daily. 01/14/21   [provider]  cycloSPORINE (RESTASIS) 0.05 % ophthalmic emulsion Place 1 drop into both eyes in the morning, at noon, and at bedtime.     [provider]  diclofenac Sodium (VOLTAREN) 1 % GEL Apply 2 g topically 4 (four) times daily. Patient taking differently: Apply 2 g topically daily as needed (pain). 09/18/19   Love, Ivan Anchors, PA-C  DULoxetine (CYMBALTA) 60 MG capsule Take 120 mg by mouth at bedtime.  08/31/20   [provider]  EASY TOUCH PEN NEEDLES 32G X 4 MM MISC USE TO INJECT INSULIN TWICE DAILY Patient taking differently: 1 each by Other route in the morning and at bedtime. 08/07/19   Renato Shin, MD  esomeprazole (NEXIUM) 40 MG capsule Take 1 capsule (40 mg total) by mouth daily. 02/12/21   Drenda Freeze, MD  famotidine (PEPCID) 20 MG tablet Take 20 mg by mouth 2 (two) times daily.    [provider]  fluticasone (FLONASE) 50 MCG/ACT nasal spray Place 2 sprays into both nostrils continuous as needed for allergies or rhinitis.    [provider]  glucose blood (ONETOUCH VERIO) test strip Test 4 times daily DX E11.9 04/27/20   Elayne Snare, MD  glucose blood test strip Test 4 times daily DX E11.9 04/27/20   Elayne Snare, MD  HUMALOG KWIKPEN 100 UNIT/ML KwikPen Inject 10-20 Units into the skin 3 (three) times daily. 12/20/20   [provider]  hydroxychloroquine (PLAQUENIL) 200 MG tablet Take 200 mg by mouth 2 (two) times daily.    [provider]  Insulin NPH, Human,, Isophane, (HUMULIN N KWIKPEN) 100 UNIT/ML Kiwkpen Inject 2.2 mLs (220 Units total) into the skin every morning 12/22/20   Renato Shin, MD  Insulin Syringe-Needle U-100 (EASY TOUCH INSULIN SYRINGE) 31G X 5/16" 1 ML MISC USE AS DIRECTED THREE TIMES A DAY Patient taking differently: 1 each by Other route in the morning, at noon, and at bedtime. 10/12/18   Renato Shin, MD  isosorbide-hydrALAZINE (BIDIL) 20-37.5 MG tablet TAKE TWO (2) TABLETS BY MOUTH THREE TIMES DAILY 01/01/21   Adrian Prows, MD  Lancets [facility] ULTRASOFT) lancets Test 4 times daily DX E11.9 04/27/20   Elayne Snare, MD  levothyroxine (SYNTHROID) 50 MCG tablet Take 1 tablet (50 mcg total) by mouth daily at 6 (six) AM. 08/30/19   Raiford Noble Latif, DO  liraglutide (VICTOZA) 18 MG/3ML SOPN Inject 1.8 mg into the skin every morning. 12/22/20   Renato Shin, MD  megestrol (MEGACE) 40 MG tablet Take 40 mg by mouth daily. 01/30/15    [provider]  metFORMIN (GLUCOPHAGE-XR) 500 MG 24 hr tablet Take 2 tablets (1,000 mg total) by mouth 2 (two) times daily. 02/02/21   Renato Shin, MD  Multiple Vitamin (MULTIVITAMIN WITH MINERALS) TABS tablet Take 1 tablet by mouth daily. 08/30/19   Raiford Noble Latif, DO  ondansetron (ZOFRAN ODT) 4 MG disintegrating tablet Take 1 tablet (4 mg total) by mouth every 8 (eight) hours as needed for nausea or vomiting. 02/17/21   Loni Beckwith, PA-C  oxybutynin (DITROPAN XL) 15 MG 24 hr tablet Take 15 mg by mouth daily.  08/30/17   [provider]  oxyCODONE-acetaminophen (PERCOCET/ROXICET) 5-325 MG tablet Take 1 tablet by mouth every 8 (eight) hours as needed for up to 3 days for severe pain. 02/17/21 02/20/21  Loni Beckwith, PA-C  polyethylene glycol (MIRALAX / GLYCOLAX) 17 g packet  Take 17 g by mouth daily. 02/12/21   Drenda Freeze, MD  pravastatin (PRAVACHOL) 10 MG tablet Take 1 tablet (10 mg total) by mouth daily after supper. 01/01/21   Adrian Prows, MD  predniSONE (DELTASONE) 1 MG tablet Take 1 mg by mouth 2 (two) times daily. 04/21/17   [provider]  pregabalin (LYRICA) 75 MG capsule Take 75 mg 3 (three) times daily by mouth.  01/28/15   [provider]  QUEtiapine (SEROQUEL) 300 MG tablet Take 300 mg by mouth at bedtime. 01/08/21   [provider]  sacubitril-valsartan (ENTRESTO) 97-103 MG Take 1 tablet by mouth 2 (two) times daily. 01/21/20   Adrian Prows, MD  spironolactone (ALDACTONE) 50 MG tablet Take 1 tablet (50 mg total) by mouth daily. 01/01/21   Adrian Prows, MD  sucralfate (CARAFATE) 1 GM/10ML suspension Take 10 mLs by mouth 2 (two) times daily. 12/09/20   [provider]  topiramate (TOPAMAX) 25 MG tablet Take 75 mg by mouth at bedtime. 03/17/16   [provider]  torsemide (DEMADEX) 20 MG tablet Take 20 mg by mouth daily as needed for fluid. 09/12/19   [provider]    Allergies    Metoclopramide, Codeine,  Hydrocodone, Penicillins, and Percocet [oxycodone-acetaminophen]  Review of Systems   Review of Systems  Constitutional:  Positive for appetite change.  Gastrointestinal:  Positive for abdominal pain, nausea and vomiting.  Neurological:  Positive for weakness (weakness on the L side, baseline) and headaches.  All other systems reviewed and are negative.  Physical Exam Updated Vital Signs LMP 03/24/2017   Physical Exam Vitals and nursing note reviewed.  Constitutional:      General: She is not in acute distress.    Appearance: Normal appearance. She is obese.     Comments: nontoxic  HENT:     Head: Normocephalic and atraumatic.  Eyes:     Conjunctiva/sclera: Conjunctivae normal.     Pupils: Pupils are equal, round, and reactive to light.  Cardiovascular:     Rate and Rhythm: Normal rate and regular rhythm.     Pulses: Normal pulses.  Pulmonary:     Effort: Pulmonary effort is normal. No respiratory distress.     Breath sounds: Normal breath sounds. No wheezing.     Comments: Speaking in full sentences.  Clear lung sounds in all fields. Abdominal:     General: There is no distension.     Palpations: Abdomen is soft. There is no mass.     Tenderness: There is abdominal tenderness. There is no guarding or rebound.     Comments: Diffuse ttp of the abd. Exam limited due to body habitus. No rigidity or distention.   Musculoskeletal:        General: Normal range of motion.     Cervical back: Normal range of motion and neck supple.  Skin:    General: Skin is warm and dry.     Capillary Refill: Capillary refill takes less than 2 seconds.  Neurological:     Mental Status: She is alert and oriented to person, place, and time.     Comments: L sided facial droop, baseline.  L sided upper and lower ext weakness, baseline.   Psychiatric:        Mood and Affect: Mood and affect normal.        Speech: Speech normal.        Behavior: Behavior normal.    ED Results / Procedures /  Treatments   Labs (  all labs ordered are listed, but only abnormal results are displayed) Labs Reviewed  CBG MONITORING, ED - Abnormal; Notable for the following components:      Result Value   Glucose-Capillary 234 (*)    All other components within normal limits  PROTIME-INR  APTT  CBC  DIFFERENTIAL  COMPREHENSIVE METABOLIC PANEL  I-STAT CHEM 8, ED  I-STAT BETA HCG BLOOD, ED ([facility], [facility], [facility] ONLY)    EKG None  Radiology DG Chest 2 View  Result Date: 02/17/2021 CLINICAL DATA:  Upper back pain radiating to the chest EXAM: CHEST - 2 VIEW COMPARISON:  02/12/2021 FINDINGS: Cardiomegaly. Stable mediastinal contours. Interstitial crowding in the setting of mildly low lung volumes. Borderline vascular congestion on the lateral view. No edema, effusion, or consolidation. IMPRESSION: Cardiomegaly and borderline vascular congestion. Electronically Signed   By: Monte Fantasia M.D.   On: 02/17/2021 05:11   DG Abd 2 Views  Result Date: 02/17/2021 CLINICAL DATA:  Abdominal pain and vomiting for several days EXAM: ABDOMEN - 2 VIEW COMPARISON:  02/12/2021 FINDINGS: Supine and decubitus views were obtained. Scattered large and small bowel gas is noted. No findings to suggest free intraperitoneal air are noted. No abnormal mass or abnormal calcifications are noted. No acute bony abnormality is seen. IMPRESSION: No acute abnormality noted. Electronically Signed   By: Inez Catalina M.D.   On: 02/17/2021 09:05    Procedures Procedures   Medications Ordered in ED Medications  sodium chloride flush (NS) 0.9 % injection 3 mL (has no administration in time range)    ED Course  I have reviewed the triage vital signs and the nursing notes.  Pertinent labs & imaging results that were available during my care of the patient were reviewed by me and considered in my medical decision making (see chart for details).  Clinical Course as of 02/19/21 0016  Thu Jul 21, 10528  6282 50 year old female brought in by  EMS for chest pain and shortness of breath.  Also had a possible syncopal event and headache.  She has some left-sided deficits from prior stroke and a code stroke was activated.  Getting CT head and CT angio.  Disposition per results of work-up. [MB]    Clinical Course User Index [MB] Hayden Rasmussen, MD   MDM Rules/Calculators/A&P                           Pt presenting for evaluation of n/v, abd pain. Additionally, pt with a severe headache. She also has dizziness/lightheadedness,a nd had a syncopal event. In triage, pt told RN she was concerned about a cva. When evaluted, pt had L sided deficits, and code stroke was called. Neuro evaluated pt, felt deficits were old, and cancelled code stroke. However in the setting of HA and syncope, recommended SAH r/o with CTA and LP.   CTA negative.  Discussed option of LP with patient, she would like to talk to her family before proceeding.  In the meantime, will treat symptomatically as I feel the majority of her symptoms are likely due to dehydration, nausea, vomiting.  Labs obtained from triage interpreted by me, overall reassuring.  Kidney, liver, pancreatic function normal.  No acidosis.  Glucose is minimally elevated at 250, however no evidence of DKA.  EKG is nonischemic.  In the setting of constipation and abdominal pain with persistent nausea and vomiting, will repeat CT scan to rule out bowel obstruction.  CT abdomen pelvis negative for acute  findings.  On reevaluation after medication, patient reports improvement of symptoms.  She is no longer having nausea and vomiting.  Pain is improving.  She has discussed LP with her family, would like to proceed with this.  LP attempted at bedside by myself and Dr. Melina Copa without success.  Will call IR for fluoro guided LP.   Discussed with Dr. Iven Finn from diagnostic radiology who states patient should get an MRI instead of an LP to rule out SAH.  I discussed neurology recommendations, and was  told the MRI will be sufficient for r/o SAH. As such, MRI ordered.   Discussed findings and plan with patient, who is agreeable.  Pt signed out to Ermalinda Memos, PA-C for f/u on MRI and PO challenge. If MRI abnormal or pt has any issues with PO challenge, consider admission.   Final Clinical Impression(s) / ED Diagnoses Final diagnoses:  None    Rx / DC Orders ED Discharge Orders     None        Franchot Heidelberg, PA-C 02/19/21 0017    Hayden Rasmussen, MD 02/19/21 1739

## 2021-02-18 NOTE — ED Provider Notes (Signed)
.  Lumbar Puncture  Date/Time: 02/18/2021 10:43 PM Performed by: Hayden Rasmussen, MD Authorized by: Hayden Rasmussen, MD   Consent:    Consent obtained:  Verbal   Consent given by:  Patient   Risks, benefits, and alternatives were discussed: yes     Risks discussed:  Bleeding, infection, pain, nerve damage, repeat procedure and headache   Alternatives discussed:  Delayed treatment and no treatment Universal protocol:    Procedure explained and questions answered to patient or proxy's satisfaction: yes     Patient identity confirmed:  Verbally with patient Pre-procedure details:    Procedure purpose:  Diagnostic   Preparation: Patient was prepped and draped in usual sterile fashion   Anesthesia:    Anesthesia method:  Local infiltration   Local anesthetic:  Lidocaine 2% WITH epi Procedure details:    Lumbar space:  L4-L5 interspace   Patient position:  Sitting   Needle gauge:  20   Number of attempts:  3 Post-procedure details:    Procedure completion:  Tolerated well, no immediate complications Comments:     unsuccessful    Hayden Rasmussen, MD 02/19/21 1739

## 2021-02-18 NOTE — Consult Note (Addendum)
NEUROLOGY CONSULTATION NOTE   Date of service: February 18, 2021 Patient Name: [name] MRN:  [id number] DOB:  1970-10-04 Reason for consult: stroke code, worst headache of life _ _ _   _ __   _ __ _ _  __ __   _ __   __ _  History of Present Illness   This a 50 year old woman with a history of CHF, diabetes, hypertension, hyperlipidemia, lupus, stroke in 2010 with residual left-sided deficits, Bell's palsy in 2012 with residual drooping of her left upper and lower face who was brought in by EMS for abdominal pain and severe headache.  Patient states that she developed a headache this morning described as the worst headache of her life, 10 out of 10 in severity, different from her prior migraines in character (throbbing) and in severity.  She states that she has never had a headache like this before.  She also felt short of breath earlier and had some abdominal pain.  Then there is a period of time that she does not remember and states that she woke up on the floor with people from EMS and the fire department surrounding her.  She does not know who called them.  She did bite her lip but not her tongue.  No incontinence noted.  No history of seizures.  She does have some left-sided weakness on exam patient states that this is baseline for her.  She still has a severe headache.  Patient takes aspirin 81 mg daily at home but no other antiplatelets and is not on therapeutic anticoagulation.  CT head was performed which showed no acute intracranial process.  tPA not performed 2/2 no new neurologic deficits and concern for possible SAH. Exam was not consistent with LVO.  CTA was performed due to concern for possible subarachnoid hemorrhage to r/o aneurysm and is pending.  Of note she has a history of intractable abdominal pain with nausea and vomiting secondary to diabetic gastroparesis.    ROS   Per HPI; all other systems reviewed and are negative  Past History   Past Medical History:  Diagnosis  Date   Acute renal failure (HCC)     Intractable nausea vomiting secondary to diabetic gastroparesis causing dehydration and acute renal failure /notes 04/01/2013   Anginal pain ([place])    Anxiety    Bell's palsy 08/09/2010   Bicornuate uterus    CAP (community acquired pneumonia) 10/2011   [facility] 11/08/2011   Chest pain 01/13/2014   CHF (congestive heart failure) ([place])    Diabetic gastroparesis associated with type 2 diabetes mellitus ([place])    this is presumed diagnoses, not confirmed by any studies.    Eczema    Family history of malignant neoplasm of breast    Family history of malignant neoplasm of ovary    GERD (gastroesophageal reflux disease)    High cholesterol    Hypertension    Liver lesion 08/13/2019   Liver mass 2014   biopsied 03/2013 at [facility], not malignant.  is to undergo radiologic ablation of the mass in sept/October 2014.    Lupus ([place])    Migraines    "maybe a couple times/yr" (04/01/2013)   Obesity    Obstructive sleep apnea on CPAP 2011   Oxygen at nights   SJOGREN'S SYNDROME 08/09/2010   SLE (systemic lupus erythematosus) ([place])    [facility] 12/01/2010   Stroke ([place]) 2010; 10/2012   "left side is still weak from it, never fully regained full strength;  no additions from stroke 10/2012"   Past Surgical History:  Procedure Laterality Date   BREAST CYST EXCISION Left 08/2005   epidermoid   CARDIAC CATHETERIZATION  02/07/12   ECTOPIC PREGNANCY SURGERY  1999   ECTOPIC PREGNANCY SURGERY  1999   ESOPHAGOGASTRODUODENOSCOPY N/A 02/26/2013   Procedure: ESOPHAGOGASTRODUODENOSCOPY (EGD);  Surgeon: Irene Shipper, MD;  Location: [facility] ENDOSCOPY;  Service: Endoscopy;  Laterality: N/A;   ESOPHAGOGASTRODUODENOSCOPY N/A 03/02/2015   Procedure: ESOPHAGOGASTRODUODENOSCOPY (EGD);  Surgeon: Milus Banister, MD;  Location: [place];  Service: Endoscopy;  Laterality: N/A;   HERNIA REPAIR     LEFT AND RIGHT HEART CATHETERIZATION WITH CORONARY ANGIOGRAM N/A 02/07/2012   Procedure: LEFT AND  RIGHT HEART CATHETERIZATION WITH CORONARY ANGIOGRAM;  Surgeon: Laverda Page, MD;  Location: [facility] CATH LAB;  Service: Cardiovascular;  Laterality: N/A;   LIVER BIOPSY  03/2013   liver mass/medical hx noted above   MUSCLE BIOPSY     for lupus/notes 02/02/9448   UMBILICAL HERNIA REPAIR  1980's   Family History  Problem Relation Age of Onset   Diabetes Mother    Asthma Mother    Urticaria Mother    Hypertension Mother    Breast cancer Maternal Aunt 33       currently 89   Stomach cancer Maternal Uncle        dx 65s; deceased   Prostate cancer Maternal Uncle        deceased 70s   Cancer Maternal Aunt        unk. primary   Cancer Maternal Aunt        unk. primary   Ovarian cancer Maternal Aunt        deceased 89s   Hypertension Other    Stroke Other    Arthritis Other    Ovarian cancer Sister 72       maternal half-sister; RAD51C positive   Cancer Paternal Aunt        unk. primary; deceased 23s   Prostate cancer Paternal Uncle        deceased 52s   Breast cancer Cousin        deceased 49s; daughter of mat uncle with stomach ca   Breast cancer Maternal Aunt        deceased 75s   Asthma Brother    Asthma Brother    Allergic rhinitis Neg Hx    Angioedema Neg Hx    Social History   Socioeconomic History   Marital status: Single    Spouse name: Not on file   Number of children: 1   Years of education: Not on file   Highest education level: Not on file  Occupational History    Employer: UNEMPLOYED    Comment: student  Tobacco Use   Smoking status: Former    Packs/day: 0.50    Years: 3.00    Pack years: 1.50    Types: Cigarettes    Quit date: 06/02/1991    Years since quitting: 29.7   Smokeless tobacco: Never   Tobacco comments:    quit 28 yrs ago  Vaping Use   Vaping Use: Never used  Substance and Sexual Activity   Alcohol use: No    Alcohol/week: 0.0 standard drinks   Drug use: No   Sexual activity: Yes    Partners: Male    Birth control/protection: None   Other Topics Concern   Not on file  Social History Narrative   Regular exercise-yes   Social Determinants of Health  Financial Resource Strain: Not on file  Food Insecurity: Not on file  Transportation Needs: Not on file  Physical Activity: Not on file  Stress: Not on file  Social Connections: Not on file   Allergies  Allergen Reactions   Metoclopramide Other (See Comments)    Developed restless leg, akathisia type limb movements.    Codeine Itching, Rash and Other (See Comments)   Hydrocodone Rash   Penicillins Itching    Tolerated Unasyn 02/01/2020   Percocet [Oxycodone-Acetaminophen] Hives and Rash    Tolerates dilaudid    Medications    Current Facility-Administered Medications:    sodium chloride flush (NS) 0.9 % injection 3 mL, 3 mL, Intravenous, Once, Hayden Rasmussen, MD  Current Outpatient Medications:    ADVAIR HFA 230-21 MCG/ACT inhaler, Inhale 1-2 puffs into the lungs 2 (two) times daily., Disp: , Rfl:    albuterol (PROVENTIL) (2.5 MG/3ML) 0.083% nebulizer solution, Take 2.5 mg by nebulization every 6 (six) hours as needed for wheezing., Disp: , Rfl:    albuterol (VENTOLIN HFA) 108 (90 Base) MCG/ACT inhaler, Inhale 2 puffs into the lungs every 6 (six) hours as needed for wheezing or shortness of breath., Disp: , Rfl:    ALPRAZolam (XANAX) 0.5 MG tablet, Take 0.5 mg 2 (two) times daily as needed by mouth for anxiety or sleep. , Disp: , Rfl:    amLODipine (NORVASC) 10 MG tablet, Take 1 tablet (10 mg total) by mouth daily., Disp: 90 tablet, Rfl: 3   ARIPiprazole (ABILIFY) 5 MG tablet, Take 5 mg by mouth daily., Disp: , Rfl:    aspirin EC 81 MG tablet, Take 81 mg by mouth daily. Swallow whole., Disp: , Rfl:    azaTHIOprine (IMURAN) 50 MG tablet, Take 150 mg by mouth in the morning and at bedtime. , Disp: , Rfl:    Azathioprine 75 MG TABS, Take 150 mg by mouth 2 (two) times daily., Disp: , Rfl:    B-D UF III MINI PEN NEEDLES 31G X 5 MM MISC, USE AS DIRECTED TWICE A  DAY (Patient taking differently: 1 each by Other route in the morning and at bedtime.), Disp: 100 each, Rfl: 2   Belimumab (BENLYSTA) 200 MG/ML SOAJ, Inject 200 mg into the skin once a week. Thursdays, Disp: , Rfl:    benzonatate (TESSALON) 100 MG capsule, Take 1 capsule by mouth 3 (three) times daily as needed for cough., Disp: , Rfl:    Blood Glucose Monitoring Suppl (ONETOUCH VERIO) w/Device KIT, Test 4 times daily.  DX E11.9, Disp: 1 kit, Rfl: 1   buPROPion (WELLBUTRIN XL) 300 MG 24 hr tablet, Take 300 mg by mouth daily., Disp: , Rfl:    carvedilol (COREG) 25 MG tablet, Take 1.5 tablets (37.5 mg total) by mouth 2 (two) times daily with a meal., Disp: 270 tablet, Rfl: 3   cetirizine (ZYRTEC) 10 MG tablet, Take 10 mg by mouth daily as needed for allergies. , Disp: , Rfl:    clotrimazole-betamethasone (LOTRISONE) cream, Apply 1 application topically 2 (two) times daily. (Patient taking differently: Apply 1 application topically daily as needed (rash).), Disp: 45 g, Rfl: 3   cromolyn (OPTICROM) 4 % ophthalmic solution, Place 1 drop into both eyes 2 (two) times daily., Disp: , Rfl:    cycloSPORINE (RESTASIS) 0.05 % ophthalmic emulsion, Place 1 drop into both eyes in the morning, at noon, and at bedtime. , Disp: , Rfl:    diclofenac Sodium (VOLTAREN) 1 % GEL, Apply 2 g topically 4 (four)  times daily. (Patient taking differently: Apply 2 g topically daily as needed (pain).), Disp: 350 g, Rfl: 0   DULoxetine (CYMBALTA) 60 MG capsule, Take 120 mg by mouth at bedtime., Disp: , Rfl:    EASY TOUCH PEN NEEDLES 32G X 4 MM MISC, USE TO INJECT INSULIN TWICE DAILY (Patient taking differently: 1 each by Other route in the morning and at bedtime.), Disp: 100 each, Rfl: 9   esomeprazole (NEXIUM) 40 MG capsule, Take 1 capsule (40 mg total) by mouth daily., Disp: 30 capsule, Rfl: 0   famotidine (PEPCID) 20 MG tablet, Take 20 mg by mouth 2 (two) times daily., Disp: , Rfl:    fluticasone (FLONASE) 50 MCG/ACT nasal spray,  Place 2 sprays into both nostrils continuous as needed for allergies or rhinitis., Disp: , Rfl:    glucose blood (ONETOUCH VERIO) test strip, Test 4 times daily DX E11.9, Disp: 100 each, Rfl: 12   glucose blood test strip, Test 4 times daily DX E11.9, Disp: 100 each, Rfl: 12   HUMALOG KWIKPEN 100 UNIT/ML KwikPen, Inject 10-20 Units into the skin 3 (three) times daily., Disp: , Rfl:    hydroxychloroquine (PLAQUENIL) 200 MG tablet, Take 200 mg by mouth 2 (two) times daily., Disp: , Rfl:    Insulin NPH, Human,, Isophane, (HUMULIN N KWIKPEN) 100 UNIT/ML Kiwkpen, Inject 2.2 mLs (220 Units total) into the skin every morning, Disp: 220 mL, Rfl: 11   Insulin Syringe-Needle U-100 (EASY TOUCH INSULIN SYRINGE) 31G X 5/16" 1 ML MISC, USE AS DIRECTED THREE TIMES A DAY (Patient taking differently: 1 each by Other route in the morning, at noon, and at bedtime.), Disp: 100 each, Rfl: 10   isosorbide-hydrALAZINE (BIDIL) 20-37.5 MG tablet, TAKE TWO (2) TABLETS BY MOUTH THREE TIMES DAILY, Disp: 270 tablet, Rfl: 3   Lancets (ONETOUCH ULTRASOFT) lancets, Test 4 times daily DX E11.9, Disp: 100 each, Rfl: 12   levothyroxine (SYNTHROID) 50 MCG tablet, Take 1 tablet (50 mcg total) by mouth daily at 6 (six) AM., Disp:  , Rfl:    liraglutide (VICTOZA) 18 MG/3ML SOPN, Inject 1.8 mg into the skin every morning., Disp: 9 mL, Rfl: 3   megestrol (MEGACE) 40 MG tablet, Take 40 mg by mouth daily., Disp: , Rfl: 3   metFORMIN (GLUCOPHAGE-XR) 500 MG 24 hr tablet, Take 2 tablets (1,000 mg total) by mouth 2 (two) times daily., Disp: 180 tablet, Rfl: 3   Multiple Vitamin (MULTIVITAMIN WITH MINERALS) TABS tablet, Take 1 tablet by mouth daily., Disp:  , Rfl:    ondansetron (ZOFRAN ODT) 4 MG disintegrating tablet, Take 1 tablet (4 mg total) by mouth every 8 (eight) hours as needed for nausea or vomiting., Disp: 20 tablet, Rfl: 0   oxybutynin (DITROPAN XL) 15 MG 24 hr tablet, Take 15 mg by mouth daily. , Disp: , Rfl:    oxyCODONE-acetaminophen  (PERCOCET/ROXICET) 5-325 MG tablet, Take 1 tablet by mouth every 8 (eight) hours as needed for up to 3 days for severe pain., Disp: 9 tablet, Rfl: 0   polyethylene glycol (MIRALAX / GLYCOLAX) 17 g packet, Take 17 g by mouth daily., Disp: 14 each, Rfl: 0   pravastatin (PRAVACHOL) 10 MG tablet, Take 1 tablet (10 mg total) by mouth daily after supper., Disp: 90 tablet, Rfl: 3   predniSONE (DELTASONE) 1 MG tablet, Take 1 mg by mouth 2 (two) times daily., Disp: , Rfl:    pregabalin (LYRICA) 75 MG capsule, Take 75 mg 3 (three) times daily by mouth. , Disp: ,  Rfl:    QUEtiapine (SEROQUEL) 300 MG tablet, Take 300 mg by mouth at bedtime., Disp: , Rfl:    sacubitril-valsartan (ENTRESTO) 97-103 MG, Take 1 tablet by mouth 2 (two) times daily., Disp: 180 tablet, Rfl: 3   spironolactone (ALDACTONE) 50 MG tablet, Take 1 tablet (50 mg total) by mouth daily., Disp: 90 tablet, Rfl: 3   sucralfate (CARAFATE) 1 GM/10ML suspension, Take 10 mLs by mouth 2 (two) times daily., Disp: , Rfl:    topiramate (TOPAMAX) 25 MG tablet, Take 75 mg by mouth at bedtime., Disp: , Rfl:    torsemide (DEMADEX) 20 MG tablet, Take 20 mg by mouth daily as needed for fluid., Disp: , Rfl:      Vitals   Vitals:   02/18/21 1926  BP: (!) 168/79  Pulse: 94  Resp: 20  Temp: 98.2 F (36.8 C)  TempSrc: Oral  SpO2: 100%     There is no height or weight on file to calculate BMI.  Physical Exam   Physical Exam Gen: A&O x4, anxious HEENT: Atraumatic except lip appears to be swollen and slightly bleeding, normocephalic;mucous membranes moist; oropharynx clear, tongue without atrophy or fasciculations. Neck: Supple, trachea midline. Resp: CTAB, no w/r/r CV: RRR, no m/g/r; nml S1 and S2. 2+ symmetric peripheral pulses. Abd: soft/NT/ND; nabs x 4 quad Extrem: Nml bulk; no cyanosis, clubbing, or edema.  Neuro: *MS: A&O x4. Follows multi-step commands.  *Speech: fluid, nondysarthric, able to name and repeat *CN:    I: Deferred    II,III: PERRLA, VFF by confrontation, optic discs not visualized 2/2 pupillary constriction   III,IV,VI: EOMI w/o nystagmus, L ptosis   V: Sensation intact from V1 to V3 to LT   VII: Eyelid closure was full.  L upper and lower facial droop   VIII: Hearing intact to voice   IX,X: Voice normal, palate elevates symmetrically    XI: SCM/trap 5/5 bilat   XII: Tongue protrudes midline, no atrophy or fasciculations  *Motor:   Normal bulk.  No tremor, rigidity or bradykinesia. RUE and RLE full strength. Slight drift LUE with 4/5 grip strength. LLE some movement anti-gravity. *Sensory: Decreased to LT LUE and LLE. No double-simultaneous extinction.  *Coordination:  FNF intact bilat *Reflexes:  1+ and symmetric throughout without clonus; toes down-going bilat *Gait: deferred  NIHSS  1a Level of Conscious.: 0 1b LOC Questions: 0 1c LOC Commands: 0 2 Best Gaze: 0 3 Visual: 0 4 Facial Palsy: 2 5a Motor Arm - left: 1 5b Motor Arm - Right: 0 6a Motor Leg - Left: 2 6b Motor Leg - Right: 0 7 Limb Ataxia: 0 8 Sensory: 1 9 Best Language: 0 10 Dysarthria: 1 11 Extinct. and Inatten.: 0  TOTAL: 7 (all deficits appear to be chronic)   Premorbid mRS = 2   Labs   CBC:  Recent Labs  Lab 02/12/21 1616 02/17/21 0449 02/18/21 1843 02/18/21 1851  WBC 9.5 8.7 9.6  --   NEUTROABS 6.8  --  6.2  --   HGB 12.1 11.6* 12.3 13.3  HCT 37.9 37.5 38.9 39.0  MCV 87.9 90.4 89.2  --   PLT 286 201 230  --     Basic Metabolic Panel:  Lab Results  Component Value Date   NA 141 02/18/2021   K 3.6 02/18/2021   CO2 19 (L) 02/18/2021   GLUCOSE 251 (H) 02/18/2021   BUN 7 02/18/2021   CREATININE 0.70 02/18/2021   CALCIUM 9.6 02/18/2021   GFRNONAA >60 02/18/2021  GFRAA >60 02/07/2020   Lipid Panel:  Lab Results  Component Value Date   LDLCALC 78 03/06/2014   HgbA1c:  Lab Results  Component Value Date   HGBA1C 13.4 (A) 12/22/2020   Urine Drug Screen: No results found for: LABOPIA,  COCAINSCRNUR, LABBENZ, AMPHETMU, THCU, LABBARB  Alcohol Level     Component Value Date/Time   [facility] <11 03/05/2014 1800     Impression   This is a 51 year old woman with a history of CHF, diabetes, hypertension, hyperlipidemia, lupus, stroke in 2010 with residual left-sided deficits, Bell's palsy in 2012 with residual drooping of her left upper and lower face who was brought in by EMS for abdominal pain and headache. She states that this headache is 10/10 worst headache of her life and different from any of the migraines she used to get years ago. Given that in combination with syncopal event concern for [facility]. Given headache has been present for >6 hrs cannot be ruled out by noncon head CT and she will require LP.  Recommendations   - LP to rule out SAH. Cell count in 2 tubes (1 and 4), protein, glucose, culture. - BP control. PRN IV hydralazine or labetalol for goal SBP<160. If she is consistently above that use clevidipine titration to goal. - Neurology will f/u CSF labs. If [place] ruled out patient can be admitted to the hospitalist service for further w/u including syncope evaluation ______________________________________________________________________   Thank you for the opportunity to take part in the care of this patient. If you have any further questions, please contact the neurology consultation attending.  Signed,  Su Monks, MD [facility] [phone]  If 7pm- 7am, please page neurology on call as listed in [place].  Addendum 2016: CTA head neg for aneurysm

## 2021-02-18 NOTE — Code Documentation (Addendum)
Stroke Response Nurse Documentation Code Stroke Documentation THANH POMERLEAU  is a 50 y.o.  y.o. female  arriving to [facility] ED via [facility] on 02/18/2021 with PMH of CHF, HLD, Lupus, DM, CVA (2010), HTN. Code stroke was activated by ED. Patient initially bib EMS with c/o abd pain and SOB, while in waiting room she told a staff member that she was afraid she was having a stroke. Patient taken immediately for imaging, stroke team met patient in CT. After further questioning LKW determined to be this morning when patient developed "the worst h/a of her life". Pt has baseline deficits on L side from previous CVA, taking aspirin 81 mg daily PTA. CBG 234, labs drawn and patient cleared for CT by Dr. Roslynn Amble. NIHSS 6, see documentation for details and code stroke times. Patient with left facial droop, left arm weakness, left leg weakness, left decreased sensation, and dysarthria  on exam. The following imaging was completed:  CT, CTA head and neck. Patient is not a candidate for tPA, outside of treatment window & concern for possible SAH. Care/Plan: q2h mNIHSS/VS and LP. Bedside handoff with ED RN Josh.    Lenore Manner  Stroke Response RN [phone]

## 2021-02-18 NOTE — ED Triage Notes (Signed)
Pt bib ems with reports of shob and abdominal pain, recently seen for same. Pt states zofran with minimal relief. Pt with L sided deficits from previous stroke.  152/40 HR 110 ST CBG 274 RR 19

## 2021-02-19 ENCOUNTER — Encounter (HOSPITAL_COMMUNITY): Payer: Self-pay | Admitting: Internal Medicine

## 2021-02-19 ENCOUNTER — Emergency Department (HOSPITAL_COMMUNITY): Payer: Medicaid Other

## 2021-02-19 DIAGNOSIS — Z20822 Contact with and (suspected) exposure to covid-19: Secondary | ICD-10-CM | POA: Diagnosis present

## 2021-02-19 DIAGNOSIS — I110 Hypertensive heart disease with heart failure: Secondary | ICD-10-CM | POA: Diagnosis present

## 2021-02-19 DIAGNOSIS — D849 Immunodeficiency, unspecified: Secondary | ICD-10-CM | POA: Diagnosis present

## 2021-02-19 DIAGNOSIS — R1310 Dysphagia, unspecified: Secondary | ICD-10-CM | POA: Diagnosis present

## 2021-02-19 DIAGNOSIS — E1169 Type 2 diabetes mellitus with other specified complication: Secondary | ICD-10-CM | POA: Diagnosis present

## 2021-02-19 DIAGNOSIS — E7800 Pure hypercholesterolemia, unspecified: Secondary | ICD-10-CM | POA: Diagnosis present

## 2021-02-19 DIAGNOSIS — E1159 Type 2 diabetes mellitus with other circulatory complications: Secondary | ICD-10-CM | POA: Diagnosis not present

## 2021-02-19 DIAGNOSIS — I69392 Facial weakness following cerebral infarction: Secondary | ICD-10-CM | POA: Diagnosis not present

## 2021-02-19 DIAGNOSIS — G4733 Obstructive sleep apnea (adult) (pediatric): Secondary | ICD-10-CM | POA: Diagnosis present

## 2021-02-19 DIAGNOSIS — M3500 Sicca syndrome, unspecified: Secondary | ICD-10-CM | POA: Diagnosis present

## 2021-02-19 DIAGNOSIS — G43009 Migraine without aura, not intractable, without status migrainosus: Secondary | ICD-10-CM

## 2021-02-19 DIAGNOSIS — K219 Gastro-esophageal reflux disease without esophagitis: Secondary | ICD-10-CM | POA: Diagnosis present

## 2021-02-19 DIAGNOSIS — Z6841 Body Mass Index (BMI) 40.0 and over, adult: Secondary | ICD-10-CM | POA: Diagnosis not present

## 2021-02-19 DIAGNOSIS — I5042 Chronic combined systolic (congestive) and diastolic (congestive) heart failure: Secondary | ICD-10-CM | POA: Diagnosis present

## 2021-02-19 DIAGNOSIS — R55 Syncope and collapse: Secondary | ICD-10-CM | POA: Diagnosis not present

## 2021-02-19 DIAGNOSIS — K2900 Acute gastritis without bleeding: Secondary | ICD-10-CM | POA: Diagnosis present

## 2021-02-19 DIAGNOSIS — E785 Hyperlipidemia, unspecified: Secondary | ICD-10-CM

## 2021-02-19 DIAGNOSIS — R112 Nausea with vomiting, unspecified: Secondary | ICD-10-CM | POA: Diagnosis present

## 2021-02-19 DIAGNOSIS — I69354 Hemiplegia and hemiparesis following cerebral infarction affecting left non-dominant side: Secondary | ICD-10-CM | POA: Diagnosis not present

## 2021-02-19 DIAGNOSIS — E876 Hypokalemia: Secondary | ICD-10-CM | POA: Diagnosis not present

## 2021-02-19 DIAGNOSIS — I152 Hypertension secondary to endocrine disorders: Secondary | ICD-10-CM | POA: Diagnosis not present

## 2021-02-19 DIAGNOSIS — I5032 Chronic diastolic (congestive) heart failure: Secondary | ICD-10-CM | POA: Diagnosis not present

## 2021-02-19 DIAGNOSIS — Z8041 Family history of malignant neoplasm of ovary: Secondary | ICD-10-CM | POA: Diagnosis not present

## 2021-02-19 DIAGNOSIS — E860 Dehydration: Secondary | ICD-10-CM | POA: Diagnosis present

## 2021-02-19 DIAGNOSIS — Z803 Family history of malignant neoplasm of breast: Secondary | ICD-10-CM | POA: Diagnosis not present

## 2021-02-19 DIAGNOSIS — M329 Systemic lupus erythematosus, unspecified: Secondary | ICD-10-CM | POA: Diagnosis present

## 2021-02-19 LAB — CBC WITH DIFFERENTIAL/PLATELET
Abs Immature Granulocytes: 0.03 10*3/uL (ref 0.00–0.07)
Basophils Absolute: 0 10*3/uL (ref 0.0–0.1)
Basophils Relative: 0 %
Eosinophils Absolute: 0.3 10*3/uL (ref 0.0–0.5)
Eosinophils Relative: 3 %
HCT: 35.5 % — ABNORMAL LOW (ref 36.0–46.0)
Hemoglobin: 11.3 g/dL — ABNORMAL LOW (ref 12.0–15.0)
Immature Granulocytes: 0 %
Lymphocytes Relative: 20 %
Lymphs Abs: 2.1 10*3/uL (ref 0.7–4.0)
MCH: 28.3 pg (ref 26.0–34.0)
MCHC: 31.8 g/dL (ref 30.0–36.0)
MCV: 88.8 fL (ref 80.0–100.0)
Monocytes Absolute: 0.9 10*3/uL (ref 0.1–1.0)
Monocytes Relative: 9 %
Neutro Abs: 6.9 10*3/uL (ref 1.7–7.7)
Neutrophils Relative %: 68 %
Platelets: 212 10*3/uL (ref 150–400)
RBC: 4 MIL/uL (ref 3.87–5.11)
RDW: 14 % (ref 11.5–15.5)
WBC: 10.2 10*3/uL (ref 4.0–10.5)
nRBC: 0 % (ref 0.0–0.2)

## 2021-02-19 LAB — BASIC METABOLIC PANEL
Anion gap: 9 (ref 5–15)
BUN: 7 mg/dL (ref 6–20)
CO2: 18 mmol/L — ABNORMAL LOW (ref 22–32)
Calcium: 9 mg/dL (ref 8.9–10.3)
Chloride: 110 mmol/L (ref 98–111)
Creatinine, Ser: 0.88 mg/dL (ref 0.44–1.00)
GFR, Estimated: >60 mL/min (ref >60)
Glucose, Bld: 242 mg/dL — ABNORMAL HIGH (ref 70–99)
Potassium: 3.4 mmol/L — ABNORMAL LOW (ref 3.5–5.1)
Sodium: 137 mmol/L (ref 135–145)

## 2021-02-19 LAB — GLUCOSE, CAPILLARY: Glucose-Capillary: 186 mg/dL — ABNORMAL HIGH (ref 70–99)

## 2021-02-19 LAB — HEPATIC FUNCTION PANEL
ALT: 12 U/L (ref 0–44)
AST: 13 U/L — ABNORMAL LOW (ref 15–41)
Albumin: 3.5 g/dL (ref 3.5–5.0)
Alkaline Phosphatase: 81 U/L (ref 38–126)
Bilirubin, Direct: <0.1 mg/dL (ref 0.0–0.2)
Total Bilirubin: 0.8 mg/dL (ref 0.3–1.2)
Total Protein: 6.7 g/dL (ref 6.5–8.1)

## 2021-02-19 LAB — CBG MONITORING, ED
Glucose-Capillary: 233 mg/dL — ABNORMAL HIGH (ref 70–99)
Glucose-Capillary: 275 mg/dL — ABNORMAL HIGH (ref 70–99)

## 2021-02-19 LAB — HIV ANTIBODY (ROUTINE TESTING W REFLEX): HIV Screen 4th Generation wRfx: NONREACTIVE

## 2021-02-19 LAB — SARS CORONAVIRUS 2 (TAT 6-24 HRS): SARS Coronavirus 2: NEGATIVE

## 2021-02-19 MED ORDER — PREDNISONE 1 MG PO TABS
1.0000 mg | ORAL_TABLET | Freq: Two times a day (BID) | ORAL | Status: DC
  Administered 2021-02-21 – 2021-02-25 (×9): 1 mg via ORAL
  Filled 2021-02-19 (×14): qty 1

## 2021-02-19 MED ORDER — INSULIN NPH (HUMAN) (ISOPHANE) 100 UNIT/ML ~~LOC~~ SUSP
180.0000 [IU] | Freq: Every day | SUBCUTANEOUS | Status: DC
  Filled 2021-02-19: qty 10

## 2021-02-19 MED ORDER — INSULIN ASPART 100 UNIT/ML IJ SOLN
0.0000 [IU] | Freq: Three times a day (TID) | INTRAMUSCULAR | Status: DC
  Administered 2021-02-19: 8 [IU] via SUBCUTANEOUS
  Administered 2021-02-20 (×2): 5 [IU] via SUBCUTANEOUS
  Administered 2021-02-20: 3 [IU] via SUBCUTANEOUS
  Administered 2021-02-21: 5 [IU] via SUBCUTANEOUS
  Administered 2021-02-21 (×2): 3 [IU] via SUBCUTANEOUS
  Administered 2021-02-21: 5 [IU] via SUBCUTANEOUS
  Administered 2021-02-22 (×2): 8 [IU] via SUBCUTANEOUS
  Administered 2021-02-23: 3 [IU] via SUBCUTANEOUS
  Administered 2021-02-23: 5 [IU] via SUBCUTANEOUS
  Administered 2021-02-24: 8 [IU] via SUBCUTANEOUS
  Administered 2021-02-24: 11 [IU] via SUBCUTANEOUS
  Administered 2021-02-24 – 2021-02-25 (×2): 5 [IU] via SUBCUTANEOUS

## 2021-02-19 MED ORDER — INSULIN ISOPHANE HUMAN 100 UNIT/ML KWIKPEN: 180.0000 [IU] | PEN_INJECTOR | Freq: Every day | SUBCUTANEOUS | Status: DC

## 2021-02-19 MED ORDER — HYDROMORPHONE HCL 1 MG/ML IJ SOLN
0.5000 mg | INTRAMUSCULAR | Status: DC | PRN
  Administered 2021-02-19 – 2021-02-25 (×24): 0.5 mg via INTRAVENOUS
  Filled 2021-02-19 (×24): qty 1

## 2021-02-19 MED ORDER — POLYETHYLENE GLYCOL 3350 17 G PO PACK: 17.0000 g | PACK | Freq: Every day | ORAL | Status: DC

## 2021-02-19 MED ORDER — INSULIN DETEMIR 100 UNIT/ML ~~LOC~~ SOLN
30.0000 [IU] | Freq: Two times a day (BID) | SUBCUTANEOUS | Status: DC
  Administered 2021-02-19 – 2021-02-25 (×13): 30 [IU] via SUBCUTANEOUS
  Filled 2021-02-19 (×14): qty 0.3

## 2021-02-19 MED ORDER — PANTOPRAZOLE SODIUM 40 MG IV SOLR
40.0000 mg | Freq: Two times a day (BID) | INTRAVENOUS | Status: DC
  Administered 2021-02-19 – 2021-02-24 (×11): 40 mg via INTRAVENOUS
  Filled 2021-02-19 (×11): qty 40

## 2021-02-19 MED ORDER — SACUBITRIL-VALSARTAN 97-103 MG PO TABS
1.0000 | ORAL_TABLET | Freq: Two times a day (BID) | ORAL | Status: DC
  Administered 2021-02-21 – 2021-02-25 (×9): 1 via ORAL
  Filled 2021-02-19 (×14): qty 1

## 2021-02-19 MED ORDER — ONDANSETRON HCL 4 MG/2ML IJ SOLN
4.0000 mg | Freq: Once | INTRAMUSCULAR | Status: AC
  Administered 2021-02-19: 4 mg via INTRAVENOUS
  Filled 2021-02-19: qty 2

## 2021-02-19 MED ORDER — TOPIRAMATE 25 MG PO TABS
75.0000 mg | ORAL_TABLET | Freq: Every day | ORAL | Status: DC
  Administered 2021-02-21 – 2021-02-24 (×4): 75 mg via ORAL
  Filled 2021-02-19 (×7): qty 3

## 2021-02-19 MED ORDER — PRAVASTATIN SODIUM 10 MG PO TABS
10.0000 mg | ORAL_TABLET | Freq: Every day | ORAL | Status: DC
  Administered 2021-02-21 – 2021-02-24 (×4): 10 mg via ORAL
  Filled 2021-02-19 (×6): qty 1

## 2021-02-19 MED ORDER — FLEET ENEMA 7-19 GM/118ML RE ENEM: 1.0000 | ENEMA | Freq: Once | RECTAL | Status: DC

## 2021-02-19 MED ORDER — LABETALOL HCL 5 MG/ML IV SOLN
10.0000 mg | INTRAVENOUS | Status: DC | PRN
  Administered 2021-02-20 – 2021-02-21 (×3): 10 mg via INTRAVENOUS
  Filled 2021-02-19 (×3): qty 4

## 2021-02-19 MED ORDER — ISOSORB DINITRATE-HYDRALAZINE 20-37.5 MG PO TABS
2.0000 | ORAL_TABLET | Freq: Three times a day (TID) | ORAL | Status: DC
  Administered 2021-02-21 – 2021-02-25 (×13): 2 via ORAL
  Filled 2021-02-19 (×18): qty 2

## 2021-02-19 MED ORDER — SODIUM CHLORIDE 0.9 % IV SOLN: INTRAVENOUS | Status: AC

## 2021-02-19 MED ORDER — SODIUM CHLORIDE 0.9 % IV SOLN
6.2500 mg | Freq: Three times a day (TID) | INTRAVENOUS | Status: DC | PRN
  Administered 2021-02-20 – 2021-02-25 (×7): 6.25 mg via INTRAVENOUS
  Filled 2021-02-19 (×9): qty 0.25

## 2021-02-19 MED ORDER — POLYETHYLENE GLYCOL 3350 17 G PO PACK
17.0000 g | PACK | Freq: Three times a day (TID) | ORAL | Status: DC
  Administered 2021-02-19 – 2021-02-25 (×12): 17 g via ORAL
  Filled 2021-02-19 (×16): qty 1

## 2021-02-19 MED ORDER — SPIRONOLACTONE 25 MG PO TABS
50.0000 mg | ORAL_TABLET | Freq: Every day | ORAL | Status: DC
  Administered 2021-02-21 – 2021-02-25 (×5): 50 mg via ORAL
  Filled 2021-02-19 (×7): qty 2

## 2021-02-19 MED ORDER — ONDANSETRON HCL 4 MG/2ML IJ SOLN
4.0000 mg | Freq: Four times a day (QID) | INTRAMUSCULAR | Status: DC | PRN
  Administered 2021-02-19 – 2021-02-25 (×14): 4 mg via INTRAVENOUS
  Filled 2021-02-19 (×17): qty 2

## 2021-02-19 MED ORDER — PREGABALIN 75 MG PO CAPS
75.0000 mg | ORAL_CAPSULE | Freq: Three times a day (TID) | ORAL | Status: DC
  Administered 2021-02-21 – 2021-02-25 (×13): 75 mg via ORAL
  Filled 2021-02-19 (×17): qty 1

## 2021-02-19 MED ORDER — ACETAMINOPHEN 650 MG RE SUPP
650.0000 mg | Freq: Once | RECTAL | Status: AC
  Administered 2021-02-19: 650 mg via RECTAL
  Filled 2021-02-19: qty 1

## 2021-02-19 MED ORDER — CARVEDILOL 25 MG PO TABS
37.5000 mg | ORAL_TABLET | Freq: Two times a day (BID) | ORAL | Status: DC
  Administered 2021-02-21 – 2021-02-25 (×9): 37.5 mg via ORAL
  Filled 2021-02-19 (×2): qty 1
  Filled 2021-02-19: qty 3
  Filled 2021-02-19 (×9): qty 1

## 2021-02-19 MED ORDER — AZATHIOPRINE 50 MG PO TABS
150.0000 mg | ORAL_TABLET | Freq: Two times a day (BID) | ORAL | Status: DC
  Administered 2021-02-21 – 2021-02-25 (×9): 150 mg via ORAL
  Filled 2021-02-19 (×14): qty 3

## 2021-02-19 MED ORDER — ARIPIPRAZOLE 5 MG PO TABS
5.0000 mg | ORAL_TABLET | Freq: Every day | ORAL | Status: DC
  Administered 2021-02-21 – 2021-02-25 (×5): 5 mg via ORAL
  Filled 2021-02-19 (×6): qty 1

## 2021-02-19 MED ORDER — HYDROXYCHLOROQUINE SULFATE 200 MG PO TABS
200.0000 mg | ORAL_TABLET | Freq: Two times a day (BID) | ORAL | Status: DC
  Administered 2021-02-21 – 2021-02-25 (×9): 200 mg via ORAL
  Filled 2021-02-19 (×13): qty 1

## 2021-02-19 MED ORDER — SENNOSIDES-DOCUSATE SODIUM 8.6-50 MG PO TABS
1.0000 | ORAL_TABLET | Freq: Two times a day (BID) | ORAL | Status: DC
  Administered 2021-02-21 – 2021-02-25 (×8): 1 via ORAL
  Filled 2021-02-19 (×10): qty 1

## 2021-02-19 MED ORDER — FENTANYL CITRATE (PF) 100 MCG/2ML IJ SOLN
50.0000 ug | Freq: Once | INTRAMUSCULAR | Status: AC
  Administered 2021-02-19: 50 ug via INTRAVENOUS
  Filled 2021-02-19: qty 2

## 2021-02-19 MED ORDER — SODIUM CHLORIDE 0.9 % IV BOLUS
500.0000 mL | Freq: Once | INTRAVENOUS | Status: AC
  Administered 2021-02-19: 500 mL via INTRAVENOUS

## 2021-02-19 MED ORDER — AMLODIPINE BESYLATE 10 MG PO TABS
10.0000 mg | ORAL_TABLET | Freq: Every day | ORAL | Status: DC
  Administered 2021-02-21 – 2021-02-25 (×5): 10 mg via ORAL
  Filled 2021-02-19 (×6): qty 1

## 2021-02-19 NOTE — ED Provider Notes (Signed)
12:30 AM Care assumed from [place], PA-C at shift change. Patient presenting for N/V, abdominal pain x 1 week. Also had an episode of dizziness, presumed presyncope and worsening headache. Had CT to assess for [facility] which was negative. LP attempted at bedside, but unsuccessful. Radiology consulted for Fluoro LP; Dr. Mckinley Jewel expressed that MRI sufficient for [facility] r/o and fluoro LP would not be indicated. Pending MRI at change of shift. Abdominal pain, N/V have improved with supportive measures.  1:55 AM Patient has returned from MRI. Complaining of pain in her abdomen which has recurred. RN reports that her elbow was compressed on her abdomen and the positioning resulted in her pain returning. Second dose of Fentanyl ordered. This provided some relief to the patient earlier when administered at 2015.  2:32 AM MRI negative for [facility]. Presumed syncope PTA suspected 2/2 volume loss and poor fluid intake with ongoing N/V symptoms x 1 week. Will PO challenge.  3:00 AM Patient assessed. States that she is persistently nauseated. Symptoms are returning. She c/o not having a bowel movement in a week. This is her third visit to the ED in the past week for similar complaints. Will admit for intractable N/V, abdominal pain. CT abdomen pelvis today and on 02/12/21 without concern for acute process.  3:16 AM Case discussed with Dr. Hal Hope who will admit.   Results for orders placed or performed during the hospital encounter of 02/18/21  Protime-INR  Result Value Ref Range   Prothrombin Time 13.5 11.4 - 15.2 seconds   INR 1.0 0.8 - 1.2  APTT  Result Value Ref Range   aPTT 23 (L) 24 - 36 seconds  CBC  Result Value Ref Range   WBC 9.6 4.0 - 10.5 K/uL   RBC 4.36 3.87 - 5.11 MIL/uL   Hemoglobin 12.3 12.0 - 15.0 g/dL   HCT 38.9 36.0 - 46.0 %   MCV 89.2 80.0 - 100.0 fL   MCH 28.2 26.0 - 34.0 pg   MCHC 31.6 30.0 - 36.0 g/dL   RDW 13.8 11.5 - 15.5 %   Platelets 230 150 - 400 K/uL   nRBC 0.0 0.0 - 0.2 %   Differential  Result Value Ref Range   Neutrophils Relative % 65 %   Neutro Abs 6.2 1.7 - 7.7 K/uL   Lymphocytes Relative 26 %   Lymphs Abs 2.5 0.7 - 4.0 K/uL   Monocytes Relative 6 %   Monocytes Absolute 0.6 0.1 - 1.0 K/uL   Eosinophils Relative 3 %   Eosinophils Absolute 0.2 0.0 - 0.5 K/uL   Basophils Relative 0 %   Basophils Absolute 0.0 0.0 - 0.1 K/uL   Immature Granulocytes 0 %   Abs Immature Granulocytes 0.03 0.00 - 0.07 K/uL  Comprehensive metabolic panel  Result Value Ref Range   Sodium 136 135 - 145 mmol/L   Potassium 3.6 3.5 - 5.1 mmol/L   Chloride 110 98 - 111 mmol/L   CO2 19 (L) 22 - 32 mmol/L   Glucose, Bld 252 (H) 70 - 99 mg/dL   BUN 7 6 - 20 mg/dL   Creatinine, Ser 0.86 0.44 - 1.00 mg/dL   Calcium 9.6 8.9 - 10.3 mg/dL   Total Protein 7.2 6.5 - 8.1 g/dL   Albumin 3.8 3.5 - 5.0 g/dL   AST 16 15 - 41 U/L   ALT 12 0 - 44 U/L   Alkaline Phosphatase 92 38 - 126 U/L   Total Bilirubin 0.7 0.3 - 1.2 mg/dL   GFR,  Estimated >60 >60 mL/min   Anion gap 7 5 - 15  I-stat chem 8, ED  Result Value Ref Range   Sodium 141 135 - 145 mmol/L   Potassium 3.6 3.5 - 5.1 mmol/L   Chloride 109 98 - 111 mmol/L   BUN 7 6 - 20 mg/dL   Creatinine, Ser 0.70 0.44 - 1.00 mg/dL   Glucose, Bld 251 (H) 70 - 99 mg/dL   Calcium, Ion 1.21 1.15 - 1.40 mmol/L   TCO2 19 (L) 22 - 32 mmol/L   Hemoglobin 13.3 12.0 - 15.0 g/dL   HCT 39.0 36.0 - 46.0 %  CBG monitoring, ED  Result Value Ref Range   Glucose-Capillary 234 (H) 70 - 99 mg/dL  I-Stat beta hCG blood, ED  Result Value Ref Range   I-stat hCG, quantitative <5.0 <5 mIU/mL   Comment 3           CT ABDOMEN PELVIS WO CONTRAST  Result Date: 02/18/2021 CLINICAL DATA:  Bowel obstruction suspected Abdominal pain. EXAM: CT ABDOMEN AND PELVIS WITHOUT CONTRAST TECHNIQUE: Multidetector CT imaging of the abdomen and pelvis was performed following the standard protocol without IV contrast. COMPARISON:  CT 6 days ago 02/12/2021 FINDINGS: Lower chest:  Subsegmental atelectasis/scarring in the left lower lobe. Mild cardiomegaly. Hepatobiliary: No focal hepatic abnormality is seen. Gallbladder physiologically distended, no calcified stone. No biliary dilatation. Pancreas: No ductal dilatation or inflammation. Spleen: Normal in size without focal abnormality. Adrenals/Urinary Tract: No adrenal nodule. Excreted IV contrast in the renal collecting systems from head and neck CTA earlier today. No hydronephrosis. Partially distended urinary bladder. Stomach/Bowel: Physiologically distended stomach. There is no small bowel obstruction, ileus, or inflammation. Decompressed small bowel. Normal appendix. Moderate volume of colonic stool. No colonic wall thickening or pericolonic edema. Sigmoid colon minimal early redundant. Vascular/Lymphatic: Normal caliber abdominal aorta. No portal venous or mesenteric gas. No abdominopelvic adenopathy. Reproductive: Uterus and bilateral adnexa are unremarkable. Other: No ascites, free air or focal fluid collection. No abdominal wall hernia. Musculoskeletal: There are no acute or suspicious osseous abnormalities. IMPRESSION: 1. No acute abnormality or explanation for abdominal pain. No bowel obstruction. 2. Moderate colonic stool burden, can be seen with constipation. Electronically Signed   By: Keith Rake M.D.   On: 02/18/2021 21:27   DG Chest 2 View  Result Date: 02/17/2021 CLINICAL DATA:  Upper back pain radiating to the chest EXAM: CHEST - 2 VIEW COMPARISON:  02/12/2021 FINDINGS: Cardiomegaly. Stable mediastinal contours. Interstitial crowding in the setting of mildly low lung volumes. Borderline vascular congestion on the lateral view. No edema, effusion, or consolidation. IMPRESSION: Cardiomegaly and borderline vascular congestion. Electronically Signed   By: Monte Fantasia M.D.   On: 02/17/2021 05:11   DG Chest 2 View  Result Date: 02/12/2021 CLINICAL DATA:  Chest pain EXAM: CHEST - 2 VIEW COMPARISON:  None.  FINDINGS: Normal mediastinum and cardiac silhouette. Normal pulmonary vasculature. No evidence of effusion, infiltrate, or pneumothorax. No acute bony abnormality. IMPRESSION: No acute cardiopulmonary process. Electronically Signed   By: Suzy Bouchard M.D.   On: 02/12/2021 17:00   MR BRAIN WO CONTRAST  Result Date: 02/19/2021 CLINICAL DATA:  Subarachnoid hemorrhage [facility]) suspected.  Headache. EXAM: MRI HEAD WITHOUT CONTRAST TECHNIQUE: Multiplanar, multiecho pulse sequences of the brain and surrounding structures were obtained without intravenous contrast. COMPARISON:  None. FINDINGS: Brain: No acute infarct, mass effect or extra-axial collection. No acute or chronic hemorrhage. Normal white matter signal, parenchymal volume and CSF spaces. Normal midline structures. Vascular: Major  flow voids are preserved. Skull and upper cervical spine: Normal calvarium and skull base. Visualized upper cervical spine and soft tissues are normal. Sinuses/Orbits:No paranasal sinus fluid levels or advanced mucosal thickening. No mastoid or middle ear effusion. Normal orbits. IMPRESSION: 1. Normal brain.  No subarachnoid hemorrhage. Electronically Signed   By: Ulyses Jarred M.D.   On: 02/19/2021 02:12   DG Abd 2 Views  Result Date: 02/17/2021 CLINICAL DATA:  Abdominal pain and vomiting for several days EXAM: ABDOMEN - 2 VIEW COMPARISON:  02/12/2021 FINDINGS: Supine and decubitus views were obtained. Scattered large and small bowel gas is noted. No findings to suggest free intraperitoneal air are noted. No abnormal mass or abnormal calcifications are noted. No acute bony abnormality is seen. IMPRESSION: No acute abnormality noted. Electronically Signed   By: Inez Catalina M.D.   On: 02/17/2021 09:05   CT Renal Stone Study  Result Date: 02/12/2021 CLINICAL DATA:  Nausea and vomiting for 2 days, abdominal pain EXAM: CT ABDOMEN AND PELVIS WITHOUT CONTRAST TECHNIQUE: Multidetector CT imaging of the abdomen and pelvis was  performed following the standard protocol without IV contrast. Unenhanced CT was performed per clinician order. Lack of IV contrast limits sensitivity and specificity, especially for evaluation of abdominal/pelvic solid viscera. COMPARISON:  01/29/2020 FINDINGS: Lower chest: No acute pleural or parenchymal lung disease. Minimal left lower lobe parenchymal lung scarring. Hepatobiliary: No focal liver abnormality is seen. No gallstones, gallbladder wall thickening, or biliary dilatation. Pancreas: Unremarkable. No pancreatic ductal dilatation or surrounding inflammatory changes. Spleen: Normal in size without focal abnormality. Adrenals/Urinary Tract: No urinary tract calculi or obstructive uropathy within either kidney. The adrenals and bladder are grossly normal. Stomach/Bowel: No bowel obstruction or ileus. Normal appendix right lower quadrant. No bowel wall thickening or inflammatory change. Vascular/Lymphatic: No significant vascular findings are present. No enlarged abdominal or pelvic lymph nodes. Reproductive: Uterus and bilateral adnexa are unremarkable. Other: No free fluid or free gas.  No abdominal wall hernia. Musculoskeletal: No acute or destructive bony lesions. Reconstructed images demonstrate no additional findings. IMPRESSION: 1. No evidence of urinary tract calculi or obstructive uropathy. 2. Unremarkable unenhanced CT of the abdomen and pelvis. Electronically Signed   By: Randa Ngo M.D.   On: 02/12/2021 19:27   CT HEAD CODE STROKE WO CONTRAST  Result Date: 02/18/2021 CLINICAL DATA:  Code stroke.  Neuro deficit, acute stroke suspected. EXAM: CT HEAD WITHOUT CONTRAST TECHNIQUE: Contiguous axial images were obtained from the base of the skull through the vertex without intravenous contrast. COMPARISON:  CT head 04/22/2016 FINDINGS: Brain: No evidence of acute large vascular territory infarction, hemorrhage, hydrocephalus, extra-axial collection or mass lesion/mass effect. Partially empty  sella. Vascular: No hyperdense vessel identified. Skull: No acute fracture. Sinuses/Orbits: Polyps or retention cysts in bilateral maxillary sinuses. Opacified posterior right ethmoid air cell. Other: No mastoid effusions. ASPECTS [facility] Stroke Program Early CT Score) total score (0-10 with 10 being normal): 10. IMPRESSION: 1. No evidence of acute large vascular territory infarct or acute hemorrhage. ASPECTS is 10. 2. Partially empty sella, which is often a normal anatomic variant but can be associated with idiopathic intracranial hypertension (pseudotumor cerebri). Code stroke imaging results were communicated on 02/18/2021 at 7:07 pm to provider Dr. Quinn Axe via [facility] text paging. Electronically Signed   By: Margaretha Sheffield MD   On: 02/18/2021 19:09   PCV ECHOCARDIOGRAM COMPLETE  Result Date: 01/24/2021 Echocardiogram 01/20/2021: Mildly depressed LV systolic function with visual EF 45-50%. Left ventricle cavity is mildly dilated. Moderate left ventricular  hypertrophy. Hypokinetic global wall motion.  Doppler evidence of grade I (impaired) diastolic dysfunction, normal LAP. Left atrial cavity is normal in size. Mild (Grade I) mitral regurgitation. Mild tricuspid regurgitation. No evidence of pulmonary hypertension. Insignificant pericardial effusion. Compared to study dated 02/02/2020: G1DD is new, Mild MR and TR are new, otherwise no significant change.   CT ANGIO HEAD NECK W WO CM (CODE STROKE)  Result Date: 02/18/2021 CLINICAL DATA:  Neurological deficit, acute, stroke suspected. EXAM: CT ANGIOGRAPHY HEAD AND NECK TECHNIQUE: Multidetector CT imaging of the head and neck was performed using the standard protocol during bolus administration of intravenous contrast. Multiplanar CT image reconstructions and MIPs were obtained to evaluate the vascular anatomy. Carotid stenosis measurements (when applicable) are obtained utilizing NASCET criteria, using the distal internal carotid diameter as the denominator.  CONTRAST:  20m OMNIPAQUE IOHEXOL 350 MG/ML SOLN COMPARISON:  Head CT earlier same day. FINDINGS: CTA NECK FINDINGS Aortic arch: The arch does not show any atherosclerotic change. Branching pattern is normal. Right carotid system: Common carotid artery widely patent to the bifurcation. The carotid bifurcation is normal. Cervical ICA is normal. Left carotid system: Common carotid artery widely patent to the bifurcation. The bifurcation is normal. Cervical ICA is normal. Vertebral arteries: Both vertebral arteries are patent at their origins. These are small vessels. The degree of opacity is low and proximal vertebral arteries are poorly seen. In the upper cervical region, small vertebral arteries do appear to show flow to the foramen magnum. Skeleton: Mid cervical spondylosis. Other neck: No mass or lymphadenopathy. Upper chest: Normal Review of the MIP images confirms the above findings CTA HEAD FINDINGS Anterior circulation: Both internal carotid arteries are patent through the skull base and siphon regions. The anterior and middle cerebral vessels show flow. There is a persistent trigeminal artery on the right which gives the majority of the flow to the posterior circulation. Posterior circulation: Diminutive vertebral arteries are patent through the foramen magnum the proximal basilar. Persistent trigeminal artery on the right gives the majority of the supply to the posterior circulation. Distal basilar artery appears normal. Superior cerebellar and posterior cerebral arteries are normal. Venous sinuses: Patent and normal. Anatomic variants: None other significant.  See above. Review of the MIP images confirms the above findings IMPRESSION: No intracranial large vessel occlusion. No atherosclerotic vascular disease is demonstrated in the neck or in the head. 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 The vertebral arteries are quite diminutive on a congenital basis. Electronically Signed   By: MNelson ChimesM.D.   On: 02/18/2021 19:34      HAntonietta Breach PA-C 02/19/21 [id number]   WRipley Fraise MD 02/19/21 [phone]

## 2021-02-19 NOTE — Evaluation (Signed)
Clinical/Bedside Swallow Evaluation Patient Details  Name: [name] MRN: [id number] Date of Birth: 11-14-1970  Today's Date: 02/19/2021 Time: SLP Start Time (ACUTE ONLY): 1315 SLP Stop Time (ACUTE ONLY): 1335 SLP Time Calculation (min) (ACUTE ONLY): 20 min  Past Medical History:  Past Medical History:  Diagnosis Date   Acute renal failure ([place])     Intractable nausea vomiting secondary to diabetic gastroparesis causing dehydration and acute renal failure /notes 04/01/2013   Anginal pain ([place])    Anxiety    Bell's palsy 08/09/2010   Bicornuate uterus    CAP (community acquired pneumonia) 10/2011   Archie Endo 11/08/2011   Chest pain 01/13/2014   CHF (congestive heart failure) ([place])    Diabetic gastroparesis associated with type 2 diabetes mellitus ([place])    this is presumed diagnoses, not confirmed by any studies.    Eczema    Family history of malignant neoplasm of breast    Family history of malignant neoplasm of ovary    GERD (gastroesophageal reflux disease)    High cholesterol    Hypertension    Liver lesion 08/13/2019   Liver mass 2014   biopsied 03/2013 at [facility], not malignant.  is to undergo radiologic ablation of the mass in sept/October 2014.    Lupus ([place])    Migraines    "maybe a couple times/yr" (04/01/2013)   Obesity    Obstructive sleep apnea on CPAP 2011   Oxygen at nights   SJOGREN'S SYNDROME 08/09/2010   SLE (systemic lupus erythematosus) ([place])    Archie Endo 12/01/2010   Stroke ([place]) 2010; 10/2012   "left side is still weak from it, never fully regained full strength; no additions from stroke 10/2012"   Past Surgical History:  Past Surgical History:  Procedure Laterality Date   BREAST CYST EXCISION Left 08/2005   epidermoid   CARDIAC CATHETERIZATION  02/07/12   ECTOPIC PREGNANCY SURGERY  1999   ECTOPIC PREGNANCY SURGERY  1999   ESOPHAGOGASTRODUODENOSCOPY N/A 02/26/2013   Procedure: ESOPHAGOGASTRODUODENOSCOPY (EGD);  Surgeon: Irene Shipper, MD;  Location: [facility]  ENDOSCOPY;  Service: Endoscopy;  Laterality: N/A;   ESOPHAGOGASTRODUODENOSCOPY N/A 03/02/2015   Procedure: ESOPHAGOGASTRODUODENOSCOPY (EGD);  Surgeon: Milus Banister, MD;  Location: [place];  Service: Endoscopy;  Laterality: N/A;   HERNIA REPAIR     LEFT AND RIGHT HEART CATHETERIZATION WITH CORONARY ANGIOGRAM N/A 02/07/2012   Procedure: LEFT AND RIGHT HEART CATHETERIZATION WITH CORONARY ANGIOGRAM;  Surgeon: Laverda Page, MD;  Location: [facility] CATH LAB;  Service: Cardiovascular;  Laterality: N/A;   LIVER BIOPSY  03/2013   liver mass/medical hx noted above   MUSCLE BIOPSY     for lupus/notes [id number]   UMBILICAL HERNIA REPAIR  1980's   HPI:  50yo female admitted 02/18/21 with N/V and headache. PMH: Lupus, Sjogren's syndrome, chronic systolic heart failure, HTN, OSA, RCVA with left hemiplegia (2010, 2014), Right Bell's Palsy (2012) with residual weakness, DM, GERD   Assessment / Plan / Recommendation Clinical Impression  Pt seen at bedside to assess swallow function and safety. RN reports pt failed bedside assessment due to coughing after drinking water. Pt presents with right facial weakness and reduced oral opening due to history of Bell's Palsy. She also exhibits reduced strength on the left due to strokes. Pt is missing upper and lower dentition. She reports globus sensation, raising concern for esophageal issues. Pt accepted trials of ice chips, thin liquid, puree, and solid textures. Cough response noted x1 after large/consecutive boluses. No cough response noted  when pt was encouraged to take small individual sips. Puree texture was tolerated well. Pt reported graham cracker felt like it got stuck. At this time, a full liquid diet is recommended. Also recommend consideration of an esophagram to evaluate esophageal motility. Safe swallow precautions were left in pt room. SLP will follow up for continued education. SLP Visit Diagnosis: Dysphagia, unspecified (R13.10)    Aspiration Risk  Mild  aspiration risk    Diet Recommendation Other (Comment) (full liquid)   Liquid Administration via: Straw;Cup Medication Administration: Whole meds with liquid Supervision: Patient able to self feed;Staff to assist with self feeding Compensations: Minimize environmental distractions;Slow rate;Small sips/bites (small individual sips) Postural Changes: Remain upright for at least 30 minutes after po intake;Seated upright at 90 degrees    Other  Recommendations Recommended Consults: Consider esophageal assessment Oral Care Recommendations: Oral care BID   Follow up Recommendations None      Frequency and Duration min 1 x/week  1 week       Prognosis Prognosis for Safe Diet Advancement: Fair Barriers to Reach Goals: Other (Comment) (orofacial weakness from multiple CVAs and Bell's Palsy)      Swallow Study   General Date of Onset: 02/18/21 HPI: 50yo female admitted 02/18/21 with N/V and headache. PMH: Lupus, Sjogren's syndrome, chronic systolic heart failure, HTN, OSA, RCVA with left hemiplegia (2010, 2014), Right Bell's Palsy (2012) with residual weakness, DM, GERD Type of Study: Bedside Swallow Evaluation Previous Swallow Assessment: 2015 - recommended esophageal work up, Dys1/NTL Diet Prior to this Study: NPO Temperature Spikes Noted: No Respiratory Status: Room air History of Recent Intubation: No Behavior/Cognition: Alert;Cooperative;Pleasant mood Oral Cavity Assessment: Within Functional Limits Oral Care Completed by SLP: No Oral Cavity - Dentition: Missing dentition Vision: Functional for self-feeding Self-Feeding Abilities: Able to feed self Patient Positioning: Upright in bed Baseline Vocal Quality: Normal Volitional Cough: Strong Volitional Swallow: Able to elicit    Oral/Motor/Sensory Function Overall Oral Motor/Sensory Function: Mild impairment Facial ROM: Reduced right Facial Symmetry: Abnormal symmetry right Facial Strength: Reduced right Facial Sensation:  Within Functional Limits Lingual ROM: Within Functional Limits Lingual Symmetry: Within Functional Limits Lingual Strength: Within Functional Limits Lingual Sensation: Within Functional Limits Velum: Within Functional Limits Mandible: Within Functional Limits   Ice Chips Ice chips: Within functional limits Presentation: Spoon   Thin Liquid Thin Liquid: Impaired Presentation: Straw Pharyngeal  Phase Impairments: Cough - Immediate Other Comments: cough noted after multiple consecutive boluses. No cough elicited when bolus size and rate were limited.    Nectar Thick Nectar Thick Liquid: Not tested   Honey Thick Honey Thick Liquid: Not tested   Puree Puree: Within functional limits Presentation: Spoon   Solid     Solid: Impaired Oral Phase Impairments: Impaired mastication Other Comments: globus sensation     Friedrich Harriott B. Quentin Ore, [facility], [place] Speech Language Pathologist Office: [phone] Pager: [phone]  Shonna Chock 02/19/2021,2:01 PM

## 2021-02-19 NOTE — H&P (Signed)
History and Physical    [name] SFK:[id number] DOB: 08-Jan-1971 DOA: 02/18/2021  PCP: Bartholome Bill, MD  Patient coming from: Home.  Chief Complaint: Nausea vomiting and headache.  HPI: [name] is a 50 y.o. female with history of lupus and Sjogren's syndrome, chronic systolic heart failure with improved EF, hypertension, sleep apnea, previous stroke with left-sided hemiplegia and history of Bell's palsy presented to the ER yesterday with complaints of having intractable nausea vomiting ongoing for the last 2 weeks and started developing headache yesterday.  Headache was mostly in the frontal CVA in nature.  No neurodeficits.  Patient also has benign generalized abdominal discomfort which has been ongoing off and on.  Denies any blood in the vomitus.  Denies diarrhea.  Denies any chest pain or shortness of breath.  ED Course: In the ER patient was evaluated by the neurologist.  Initial plan was to get a lumbar puncture but 1 #0.2 attempted it was not successful.  CT angiogram of the head and neck and CT head was unremarkable and MRI brain was done which did not show any signs of subarachnoid hemorrhage.  At this point radiologist advised not necessary to do fluoroscopic guided lumbar puncture since scans were negative for bleed both CT angio and MRI.  This was discussed with neurologist.  Patient still has nausea vomiting admitted for further management.  Labs show normal LFTs.  CT abdomen pelvis was unremarkable.  Review of Systems: As per HPI, rest all negative.   Past Medical History:  Diagnosis Date   Acute renal failure (HCC)     Intractable nausea vomiting secondary to diabetic gastroparesis causing dehydration and acute renal failure /notes 04/01/2013   Anginal pain ([place])    Anxiety    Bell's palsy 08/09/2010   Bicornuate uterus    CAP (community acquired pneumonia) 10/2011   Archie Endo 11/08/2011   Chest pain 01/13/2014   CHF (congestive heart failure) ([place])    Diabetic  gastroparesis associated with type 2 diabetes mellitus ([place])    this is presumed diagnoses, not confirmed by any studies.    Eczema    Family history of malignant neoplasm of breast    Family history of malignant neoplasm of ovary    GERD (gastroesophageal reflux disease)    High cholesterol    Hypertension    Liver lesion 08/13/2019   Liver mass 2014   biopsied 03/2013 at [facility], not malignant.  is to undergo radiologic ablation of the mass in sept/October 2014.    Lupus ([place])    Migraines    "maybe a couple times/yr" (04/01/2013)   Obesity    Obstructive sleep apnea on CPAP 2011   Oxygen at nights   SJOGREN'S SYNDROME 08/09/2010   SLE (systemic lupus erythematosus) ([place])    Archie Endo 12/01/2010   Stroke ([place]) 2010; 10/2012   "left side is still weak from it, never fully regained full strength; no additions from stroke 10/2012"    Past Surgical History:  Procedure Laterality Date   BREAST CYST EXCISION Left 08/2005   epidermoid   CARDIAC CATHETERIZATION  02/07/12   ECTOPIC PREGNANCY SURGERY  1999   ECTOPIC PREGNANCY SURGERY  1999   ESOPHAGOGASTRODUODENOSCOPY N/A 02/26/2013   Procedure: ESOPHAGOGASTRODUODENOSCOPY (EGD);  Surgeon: Irene Shipper, MD;  Location: [facility] ENDOSCOPY;  Service: Endoscopy;  Laterality: N/A;   ESOPHAGOGASTRODUODENOSCOPY N/A 03/02/2015   Procedure: ESOPHAGOGASTRODUODENOSCOPY (EGD);  Surgeon: Milus Banister, MD;  Location: [place];  Service: Endoscopy;  Laterality: N/A;  HERNIA REPAIR     LEFT AND RIGHT HEART CATHETERIZATION WITH CORONARY ANGIOGRAM N/A 02/07/2012   Procedure: LEFT AND RIGHT HEART CATHETERIZATION WITH CORONARY ANGIOGRAM;  Surgeon: Laverda Page, MD;  Location: [facility] CATH LAB;  Service: Cardiovascular;  Laterality: N/A;   LIVER BIOPSY  03/2013   liver mass/medical hx noted above   MUSCLE BIOPSY     for lupus/notes 09/05/8525   UMBILICAL HERNIA REPAIR  1980's     reports that she quit smoking about 29 years ago. Her smoking use included  cigarettes. She has a 1.50 pack-year smoking history. She has never used smokeless tobacco. She reports that she does not drink alcohol and does not use drugs.  Allergies  Allergen Reactions   Metoclopramide Other (See Comments)    Developed restless leg, akathisia type limb movements.    Codeine Itching, Rash and Other (See Comments)   Hydrocodone Rash   Penicillins Itching    Tolerated Unasyn 02/01/2020   Percocet [Oxycodone-Acetaminophen] Hives and Rash    Tolerates dilaudid    Family History  Problem Relation Age of Onset   Diabetes Mother    Asthma Mother    Urticaria Mother    Hypertension Mother    Breast cancer Maternal Aunt 38       currently 57   Stomach cancer Maternal Uncle        dx 32s; deceased   Prostate cancer Maternal Uncle        deceased 68s   Cancer Maternal Aunt        unk. primary   Cancer Maternal Aunt        unk. primary   Ovarian cancer Maternal Aunt        deceased 54s   Hypertension Other    Stroke Other    Arthritis Other    Ovarian cancer Sister 62       maternal half-sister; RAD51C positive   Cancer Paternal Aunt        unk. primary; deceased 26s   Prostate cancer Paternal Uncle        deceased 33s   Breast cancer Cousin        deceased 46s; daughter of mat uncle with stomach ca   Breast cancer Maternal Aunt        deceased 28s   Asthma Brother    Asthma Brother    Allergic rhinitis Neg Hx    Angioedema Neg Hx     Prior to Admission medications   Medication Sig Start Date End Date Taking? Authorizing Provider  ADVAIR HFA 230-21 MCG/ACT inhaler Inhale 1-2 puffs into the lungs 2 (two) times daily. 12/20/20   [provider]  albuterol (PROVENTIL) (2.5 MG/3ML) 0.083% nebulizer solution Take 2.5 mg by nebulization every 6 (six) hours as needed for wheezing.    [provider]  albuterol (VENTOLIN HFA) 108 (90 Base) MCG/ACT inhaler Inhale 2 puffs into the lungs every 6 (six) hours as needed for wheezing or shortness of  breath.    [provider]  ALPRAZolam Duanne Moron) 0.5 MG tablet Take 0.5 mg 2 (two) times daily as needed by mouth for anxiety or sleep.  04/19/17   [provider]  amLODipine (NORVASC) 10 MG tablet Take 1 tablet (10 mg total) by mouth daily. 01/01/21   Adrian Prows, MD  ARIPiprazole (ABILIFY) 5 MG tablet Take 5 mg by mouth daily. 12/08/20   [provider]  aspirin EC 81 MG tablet Take 81 mg by mouth daily. Swallow whole.  [provider]  azaTHIOprine (IMURAN) 50 MG tablet Take 150 mg by mouth in the morning and at bedtime.     [provider]  Azathioprine 75 MG TABS Take 150 mg by mouth 2 (two) times daily. 02/04/21   [provider]  B-D UF III MINI PEN NEEDLES 31G X 5 MM MISC USE AS DIRECTED TWICE A DAY Patient taking differently: 1 each by Other route in the morning and at bedtime. 10/31/18   Renato Shin, MD  Belimumab (BENLYSTA) 200 MG/ML SOAJ Inject 200 mg into the skin once a week. Thursdays 12/19/19   [provider]  benzonatate (TESSALON) 100 MG capsule Take 1 capsule by mouth 3 (three) times daily as needed for cough. 05/23/19   [provider]  Blood Glucose Monitoring Suppl (ONETOUCH VERIO) w/Device KIT Test 4 times daily.  DX E11.9 04/27/20   Elayne Snare, MD  buPROPion (WELLBUTRIN XL) 300 MG 24 hr tablet Take 300 mg by mouth daily. 06/28/19   [provider]  carvedilol (COREG) 25 MG tablet Take 1.5 tablets (37.5 mg total) by mouth 2 (two) times daily with a meal. 01/01/21   Adrian Prows, MD  cetirizine (ZYRTEC) 10 MG tablet Take 10 mg by mouth daily as needed for allergies.     [provider]  clotrimazole-betamethasone (LOTRISONE) cream Apply 1 application topically 2 (two) times daily. Patient taking differently: Apply 1 application topically daily as needed (rash). 01/01/19   Renato Shin, MD  cromolyn (OPTICROM) 4 % ophthalmic solution Place 1 drop into both eyes 2 (two) times daily. 01/14/21   [provider]  cycloSPORINE (RESTASIS) 0.05 % ophthalmic emulsion Place 1 drop into both eyes in the morning, at noon, and at bedtime.     [provider]  diclofenac Sodium (VOLTAREN) 1 % GEL Apply 2 g topically 4 (four) times daily. Patient taking differently: Apply 2 g topically daily as needed (pain). 09/18/19   Love, Ivan Anchors, PA-C  DULoxetine (CYMBALTA) 60 MG capsule Take 120 mg by mouth at bedtime. 08/31/20   [provider]  EASY TOUCH PEN NEEDLES 32G X 4 MM MISC USE TO INJECT INSULIN TWICE DAILY Patient taking differently: 1 each by Other route in the morning and at bedtime. 08/07/19   Renato Shin, MD  esomeprazole (NEXIUM) 40 MG capsule Take 1 capsule (40 mg total) by mouth daily. 02/12/21   Drenda Freeze, MD  famotidine (PEPCID) 20 MG tablet Take 20 mg by mouth 2 (two) times daily.    [provider]  fluticasone (FLONASE) 50 MCG/ACT nasal spray Place 2 sprays into both nostrils continuous as needed for allergies or rhinitis.    [provider]  glucose blood (ONETOUCH VERIO) test strip Test 4 times daily DX E11.9 04/27/20   Elayne Snare, MD  glucose blood test strip Test 4 times daily DX E11.9 04/27/20   Elayne Snare, MD  HUMALOG KWIKPEN 100 UNIT/ML KwikPen Inject 10-20 Units into the skin 3 (three) times daily. 12/20/20   [provider]  hydroxychloroquine (PLAQUENIL) 200 MG tablet Take 200 mg by mouth 2 (two) times daily.    [provider]  Insulin NPH, Human,, Isophane, (HUMULIN N KWIKPEN) 100 UNIT/ML Kiwkpen Inject 2.2 mLs (220 Units total) into the skin every morning 12/22/20   Renato Shin, MD  Insulin Syringe-Needle U-100 (EASY TOUCH INSULIN SYRINGE) 31G X 5/16" 1 ML MISC USE AS DIRECTED THREE TIMES A DAY Patient taking differently: 1 each by Other route in  the morning, at noon, and at bedtime. 10/12/18   Renato Shin, MD  isosorbide-hydrALAZINE (BIDIL) 20-37.5 MG tablet TAKE TWO (2) TABLETS BY MOUTH THREE TIMES DAILY 01/01/21    Adrian Prows, MD  Lancets [facility] ULTRASOFT) lancets Test 4 times daily DX E11.9 04/27/20   Elayne Snare, MD  levothyroxine (SYNTHROID) 50 MCG tablet Take 1 tablet (50 mcg total) by mouth daily at 6 (six) AM. 08/30/19   Raiford Noble Latif, DO  liraglutide (VICTOZA) 18 MG/3ML SOPN Inject 1.8 mg into the skin every morning. 12/22/20   Renato Shin, MD  megestrol (MEGACE) 40 MG tablet Take 40 mg by mouth daily. 01/30/15   [provider]  metFORMIN (GLUCOPHAGE-XR) 500 MG 24 hr tablet Take 2 tablets (1,000 mg total) by mouth 2 (two) times daily. 02/02/21   Renato Shin, MD  Multiple Vitamin (MULTIVITAMIN WITH MINERALS) TABS tablet Take 1 tablet by mouth daily. 08/30/19   Raiford Noble Latif, DO  ondansetron (ZOFRAN ODT) 4 MG disintegrating tablet Take 1 tablet (4 mg total) by mouth every 8 (eight) hours as needed for nausea or vomiting. 02/17/21   Loni Beckwith, PA-C  oxybutynin (DITROPAN XL) 15 MG 24 hr tablet Take 15 mg by mouth daily.  08/30/17   [provider]  oxyCODONE-acetaminophen (PERCOCET/ROXICET) 5-325 MG tablet Take 1 tablet by mouth every 8 (eight) hours as needed for up to 3 days for severe pain. 02/17/21 02/20/21  Loni Beckwith, PA-C  polyethylene glycol (MIRALAX / GLYCOLAX) 17 g packet Take 17 g by mouth daily. 02/12/21   Drenda Freeze, MD  pravastatin (PRAVACHOL) 10 MG tablet Take 1 tablet (10 mg total) by mouth daily after supper. 01/01/21   Adrian Prows, MD  predniSONE (DELTASONE) 1 MG tablet Take 1 mg by mouth 2 (two) times daily. 04/21/17   [provider]  pregabalin (LYRICA) 75 MG capsule Take 75 mg 3 (three) times daily by mouth.  01/28/15   [provider]  QUEtiapine (SEROQUEL) 300 MG tablet Take 300 mg by mouth at bedtime. 01/08/21   [provider]  sacubitril-valsartan (ENTRESTO) 97-103 MG Take 1 tablet by mouth 2 (two) times daily. 01/21/20   Adrian Prows, MD  spironolactone (ALDACTONE) 50 MG tablet Take 1 tablet (50 mg total) by  mouth daily. 01/01/21   Adrian Prows, MD  sucralfate (CARAFATE) 1 GM/10ML suspension Take 10 mLs by mouth 2 (two) times daily. 12/09/20   [provider]  topiramate (TOPAMAX) 25 MG tablet Take 75 mg by mouth at bedtime. 03/17/16   [provider]  torsemide (DEMADEX) 20 MG tablet Take 20 mg by mouth daily as needed for fluid. 09/12/19   [provider]    Physical Exam: Constitutional: Moderately built and nourished. Vitals:   02/19/21 0415 02/19/21 0430 02/19/21 0445 02/19/21 0500  BP: (!) 175/81 (!) 166/84 (!) 168/85 (!) 168/86  Pulse:      Resp: 19 19 (!) 21 (!) 22  Temp:      TempSrc:      SpO2:       Eyes: Anicteric no pallor. ENMT: No discharge from the ears eyes nose and mouth. Neck: No mass felt.  No neck rigidity. Respiratory: No rhonchi or crepitations. Cardiovascular: S1-S2 heard. Abdomen: Soft nontender bowel sound present. Musculoskeletal: No edema. Skin: No rash. Neurologic: Alert awake oriented time place and person.  Has left-sided hemiplegia and left-sided facial droop from previous Bell's palsy and stroke. Psychiatric: Appears normal.  Normal affect.   Labs on  Admission: I have personally reviewed following labs and imaging studies  CBC: Recent Labs  Lab 02/12/21 1616 02/17/21 0449 02/18/21 1843 02/18/21 1851  WBC 9.5 8.7 9.6  --   NEUTROABS 6.8  --  6.2  --   HGB 12.1 11.6* 12.3 13.3  HCT 37.9 37.5 38.9 39.0  MCV 87.9 90.4 89.2  --   PLT 286 201 230  --    Basic Metabolic Panel: Recent Labs  Lab 02/12/21 1616 02/17/21 0449 02/18/21 1843 02/18/21 1851  NA 137 138 136 141  K 3.7 3.6 3.6 3.6  CL 109 110 110 109  CO2 21* 20* 19*  --   GLUCOSE 301* 265* 252* 251*  BUN 11 9 7 7   CREATININE 0.92 0.93 0.86 0.70  CALCIUM 9.4 9.4 9.6  --    GFR: Estimated Creatinine Clearance: 139.6 mL/min (by C-G formula based on SCr of 0.7 mg/dL). Liver Function Tests: Recent Labs  Lab 02/12/21 1616 02/18/21 1843  AST 12* 16  ALT 12  12  ALKPHOS 96 92  BILITOT 0.5 0.7  PROT 7.2 7.2  ALBUMIN 3.6 3.8   Recent Labs  Lab 02/12/21 1616  LIPASE 26   No results for input(s): AMMONIA in the last 168 hours. Coagulation Profile: Recent Labs  Lab 02/18/21 1843  INR 1.0   Cardiac Enzymes: No results for input(s): CKTOTAL, CKMB, CKMBINDEX, TROPONINI in the last 168 hours. BNP (last 3 results) No results for input(s): PROBNP in the last 8760 hours. HbA1C: No results for input(s): HGBA1C in the last 72 hours. CBG: Recent Labs  Lab 02/18/21 1842  GLUCAP 234*   Lipid Profile: No results for input(s): CHOL, HDL, LDLCALC, TRIG, CHOLHDL, LDLDIRECT in the last 72 hours. Thyroid Function Tests: No results for input(s): TSH, T4TOTAL, FREET4, T3FREE, THYROIDAB in the last 72 hours. Anemia Panel: No results for input(s): VITAMINB12, FOLATE, FERRITIN, TIBC, IRON, RETICCTPCT in the last 72 hours. Urine analysis:    Component Value Date/Time   COLORURINE AMBER (A) 02/12/2021 2303   APPEARANCEUR HAZY (A) 02/12/2021 2303   LABSPEC 1.033 (H) 02/12/2021 2303   PHURINE 5.0 02/12/2021 2303   GLUCOSEU >=500 (A) 02/12/2021 2303   HGBUR NEGATIVE 02/12/2021 2303   BILIRUBINUR NEGATIVE 02/12/2021 2303   KETONESUR 5 (A) 02/12/2021 2303   PROTEINUR 100 (A) 02/12/2021 2303   UROBILINOGEN 0.2 02/27/2015 1733   NITRITE NEGATIVE 02/12/2021 2303   LEUKOCYTESUR SMALL (A) 02/12/2021 2303   Sepsis Labs: @LABRCNTIP (procalcitonin:4,lacticidven:4) )No results found for this or any previous visit (from the past 240 hour(s)).   Radiological Exams on Admission: CT ABDOMEN PELVIS WO CONTRAST  Result Date: 02/18/2021 CLINICAL DATA:  Bowel obstruction suspected Abdominal pain. EXAM: CT ABDOMEN AND PELVIS WITHOUT CONTRAST TECHNIQUE: Multidetector CT imaging of the abdomen and pelvis was performed following the standard protocol without IV contrast. COMPARISON:  CT 6 days ago 02/12/2021 FINDINGS: Lower chest: Subsegmental atelectasis/scarring in  the left lower lobe. Mild cardiomegaly. Hepatobiliary: No focal hepatic abnormality is seen. Gallbladder physiologically distended, no calcified stone. No biliary dilatation. Pancreas: No ductal dilatation or inflammation. Spleen: Normal in size without focal abnormality. Adrenals/Urinary Tract: No adrenal nodule. Excreted IV contrast in the renal collecting systems from head and neck CTA earlier today. No hydronephrosis. Partially distended urinary bladder. Stomach/Bowel: Physiologically distended stomach. There is no small bowel obstruction, ileus, or inflammation. Decompressed small bowel. Normal appendix. Moderate volume of colonic stool. No colonic wall thickening or pericolonic edema. Sigmoid colon minimal early redundant. Vascular/Lymphatic: Normal caliber abdominal aorta.  No portal venous or mesenteric gas. No abdominopelvic adenopathy. Reproductive: Uterus and bilateral adnexa are unremarkable. Other: No ascites, free air or focal fluid collection. No abdominal wall hernia. Musculoskeletal: There are no acute or suspicious osseous abnormalities. IMPRESSION: 1. No acute abnormality or explanation for abdominal pain. No bowel obstruction. 2. Moderate colonic stool burden, can be seen with constipation. Electronically Signed   By: Keith Rake M.D.   On: 02/18/2021 21:27   MR BRAIN WO CONTRAST  Result Date: 02/19/2021 CLINICAL DATA:  Subarachnoid hemorrhage [facility]) suspected.  Headache. EXAM: MRI HEAD WITHOUT CONTRAST TECHNIQUE: Multiplanar, multiecho pulse sequences of the brain and surrounding structures were obtained without intravenous contrast. COMPARISON:  None. FINDINGS: Brain: No acute infarct, mass effect or extra-axial collection. No acute or chronic hemorrhage. Normal white matter signal, parenchymal volume and CSF spaces. Normal midline structures. Vascular: Major flow voids are preserved. Skull and upper cervical spine: Normal calvarium and skull base. Visualized upper cervical spine and  soft tissues are normal. Sinuses/Orbits:No paranasal sinus fluid levels or advanced mucosal thickening. No mastoid or middle ear effusion. Normal orbits. IMPRESSION: 1. Normal brain.  No subarachnoid hemorrhage. Electronically Signed   By: Ulyses Jarred M.D.   On: 02/19/2021 02:12   DG Abd 2 Views  Result Date: 02/17/2021 CLINICAL DATA:  Abdominal pain and vomiting for several days EXAM: ABDOMEN - 2 VIEW COMPARISON:  02/12/2021 FINDINGS: Supine and decubitus views were obtained. Scattered large and small bowel gas is noted. No findings to suggest free intraperitoneal air are noted. No abnormal mass or abnormal calcifications are noted. No acute bony abnormality is seen. IMPRESSION: No acute abnormality noted. Electronically Signed   By: Inez Catalina M.D.   On: 02/17/2021 09:05   CT HEAD CODE STROKE WO CONTRAST  Result Date: 02/18/2021 CLINICAL DATA:  Code stroke.  Neuro deficit, acute stroke suspected. EXAM: CT HEAD WITHOUT CONTRAST TECHNIQUE: Contiguous axial images were obtained from the base of the skull through the vertex without intravenous contrast. COMPARISON:  CT head 04/22/2016 FINDINGS: Brain: No evidence of acute large vascular territory infarction, hemorrhage, hydrocephalus, extra-axial collection or mass lesion/mass effect. Partially empty sella. Vascular: No hyperdense vessel identified. Skull: No acute fracture. Sinuses/Orbits: Polyps or retention cysts in bilateral maxillary sinuses. Opacified posterior right ethmoid air cell. Other: No mastoid effusions. ASPECTS [facility] Stroke Program Early CT Score) total score (0-10 with 10 being normal): 10. IMPRESSION: 1. No evidence of acute large vascular territory infarct or acute hemorrhage. ASPECTS is 10. 2. Partially empty sella, which is often a normal anatomic variant but can be associated with idiopathic intracranial hypertension (pseudotumor cerebri). Code stroke imaging results were communicated on 02/18/2021 at 7:07 pm to provider Dr. Quinn Axe  via [facility] text paging. Electronically Signed   By: Margaretha Sheffield MD   On: 02/18/2021 19:09   CT ANGIO HEAD NECK W WO CM (CODE STROKE)  Result Date: 02/18/2021 CLINICAL DATA:  Neurological deficit, acute, stroke suspected. EXAM: CT ANGIOGRAPHY HEAD AND NECK TECHNIQUE: Multidetector CT imaging of the head and neck was performed using the standard protocol during bolus administration of intravenous contrast. Multiplanar CT image reconstructions and MIPs were obtained to evaluate the vascular anatomy. Carotid stenosis measurements (when applicable) are obtained utilizing NASCET criteria, using the distal internal carotid diameter as the denominator. CONTRAST:  28m OMNIPAQUE IOHEXOL 350 MG/ML SOLN COMPARISON:  Head CT earlier same day. FINDINGS: CTA NECK FINDINGS Aortic arch: The arch does not show any atherosclerotic change. Branching pattern is normal. Right carotid system: Common  carotid artery widely patent to the bifurcation. The carotid bifurcation is normal. Cervical ICA is normal. Left carotid system: Common carotid artery widely patent to the bifurcation. The bifurcation is normal. Cervical ICA is normal. Vertebral arteries: Both vertebral arteries are patent at their origins. These are small vessels. The degree of opacity is low and proximal vertebral arteries are poorly seen. In the upper cervical region, small vertebral arteries do appear to show flow to the foramen magnum. Skeleton: Mid cervical spondylosis. Other neck: No mass or lymphadenopathy. Upper chest: Normal Review of the MIP images confirms the above findings CTA HEAD FINDINGS Anterior circulation: Both internal carotid arteries are patent through the skull base and siphon regions. The anterior and middle cerebral vessels show flow. There is a persistent trigeminal artery on the right which gives the majority of the flow to the posterior circulation. Posterior circulation: Diminutive vertebral arteries are patent through the foramen magnum  the proximal basilar. Persistent trigeminal artery on the right gives the majority of the supply to the posterior circulation. Distal basilar artery appears normal. Superior cerebellar and posterior cerebral arteries are normal. Venous sinuses: Patent and normal. Anatomic variants: None other significant.  See above. Review of the MIP images confirms the above findings IMPRESSION: No intracranial large vessel occlusion. No atherosclerotic vascular disease is demonstrated in the neck or in the head. 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 The vertebral arteries are quite diminutive on a congenital basis. Electronically Signed   By: Nelson Chimes M.D.   On: 02/18/2021 19:34    EKG: Independently reviewed.  Normal sinus rhythm with nonspecific changes.  Assessment/Plan Principal Problem:   Nausea & vomiting Active Problems:   SLE   Hypertension complicating diabetes (HCC)   Chronic diastolic CHF (congestive heart failure) (HCC)   Worst headache of life   Type 2 diabetes mellitus with hyperlipidemia (HCC)    Intractable nausea vomiting could be from diabetic gastroparesis.  CT abdomen pelvis and LFTs are normal.  We will repeat labs.  Keep patient on antiemetics.  Protonix.  See if symptoms does not improve then consult GI. Headache which is improved at this time.  CT angiogram of the head and neck CT head and MRI brain negative for any bleed.  Lumbar puncture was attempted but was unsuccessful.  At this point radiologist recommended since all scans are negative for bleed do not need to have fluoroscopic guided lumbar puncture as per the ER physician discussed with the radiologist. Hypertension uncontrolled we will continue home dose of Coreg BiDil amlodipine spironolactone and as needed IV labetalol has been ordered. History of chronic systolic heart failure  with improved EF presently appears compensated.  Continue home medication as suggested #3. History of sleep apnea on CPAP at bedtime. History of hepatoma status post embolization. Diabetes mellitus type 2 on NPH insulin to 20 5 in the morning.  I decreased it to 185 since.  He could not level.  If patient starts eating well and if CBGs are acceptable go back to home dose. History of SLE and Sjogren's syndrome on Imuran prednisone and Plaquenil. History of anxiety -we will need to verify patient's home medications.   DVT prophylaxis: SCDs for now in anticipation of patient needs procedure will hold anticoagulation. Code Status: Full code. Family Communication: Discussed with patient. Disposition Plan: Home. Consults called: Neurologist. Admission status: Observation.   Doreatha Lew  Hal Hope MD [facility] Pager [phone].  If 7PM-7AM, please contact night-coverage [email or web address] Password [facility]  02/19/2021, 5:33 AM

## 2021-02-19 NOTE — Progress Notes (Addendum)
Inpatient Diabetes Program Recommendations  AACE/ADA: New Consensus Statement on Inpatient Glycemic Control (2015)  Target Ranges:  Prepandial:   less than 140 mg/dL      Peak postprandial:   less than 180 mg/dL (1-2 hours)      Critically ill patients:  140 - 180 mg/dL   Lab Results  Component Value Date   GLUCAP 233 (H) 02/19/2021   HGBA1C 13.4 (A) 12/22/2020    Review of Glycemic Control  Diabetes history: DM 2 Outpatient Diabetes medications: Humalog 10-20 units tid, Humulin N (NPH) 220 units Daily, Victoza 1.8 mg weekly, Metformin 1000 mg bid Current orders for Inpatient glycemic control:  NPH 180 units Daily Novolog 0-15 units tid  Sees Dr. Loanne Drilling, Endocrinologist, last visit on 5/24 with A1c 13.4 at that time.  Inpatient Diabetes Program Recommendations:    -  D/C NPH dose -  Consider Levemir 30 units bid  Note: Novolog being held due to NPO status will speak to nursing about giving correction scale. Messaging Dr. Erlinda Hong for possible insulin changes. Will touch base with pt.   Addendum 1:22 pm:  Spoke with pt regarding A1c of 13.4%. Pt reports she was depressed and struggling with the death of her mother, brother, and sister and not eating as she should. Pt is more encouraged to watch what she is eating. She has gotten her A1c level down before. Discussed insulin changes while in the hospital. Pt is agreeable at this time. Discussed importance of glucose control.  Thanks,  Tama Headings RN, MSN, BC-ADM Inpatient Diabetes Coordinator Team Pager [phone] (8a-5p)

## 2021-02-19 NOTE — Consult Note (Signed)
Referring Provider: Dr. Florencia Reasons Primary Care Physician:  Bartholome Bill, MD Primary Gastroenterologist: [facility] (prior patient of Dr. Penelope Coop)  Reason for Consultation:  Nausea and vomiting  HPI: [name] is a 50 y.o. female with past medical history of hepatic adenomas (dx 2014), ruptured hepatic angioma (4818), diastolic CHF, CVA with left-sided hemiplegia, OSA, SLE, Sjogren's syndrome presenting for consultation of nausea and vomiting.   Patient has a history chronic nausea and vomiting, but she states she had not been having many symptoms until 2 weeks ago, when she started experiencing intractable nausea and vomiting.  She states it initially started with the inability to keep her pills/medications down.  Then, it progressed to the point where she was unable to keep any food or liquids down.  Now, she states she has nausea and vomiting even without consuming food/fluids.  Denies coffee ground emesis or hematemesis.  Has some chest "discomfort" with episodes but no dysphagia.  She denies prior nausea/vomiting of the severity.  She reports diffuse upper abdominal discomfort.  She also reports constipation and has not had a stool for the last 8 days.   No prior EGD or colonoscopy. Takes 81 mg ASA daily, but otherwise no NSAID, blood thinner use.  No marijuana or illicit drug use.  Normal gastric emptying study 11/2019.  She also had an upper GI series which showed impairment of lower esophageal sphincter relaxation, no stricture or mass.  She was diagnosed with hepatic adenomas in 2014, and she is followed by [facility].  She did not undergo surgery due to comorbid conditions.  She underwent bland embolization, and interval imaging imaging up to 2016 demonstrated stable treated adenoma. She presented in 08/2019 with ruptured hepatic adenoma with hemoperitoneum. She underwent bland embolization on 08/21/2019 for active extravasation at the hepatic dome.   Past Medical History:   Diagnosis Date   Acute renal failure (HCC)     Intractable nausea vomiting secondary to diabetic gastroparesis causing dehydration and acute renal failure /notes 04/01/2013   Anginal pain ([place])    Anxiety    Bell's palsy 08/09/2010   Bicornuate uterus    CAP (community acquired pneumonia) 10/2011   [facility] 11/08/2011   Chest pain 01/13/2014   CHF (congestive heart failure) ([place])    Diabetic gastroparesis associated with type 2 diabetes mellitus ([place])    this is presumed diagnoses, not confirmed by any studies.    Eczema    Family history of malignant neoplasm of breast    Family history of malignant neoplasm of ovary    GERD (gastroesophageal reflux disease)    High cholesterol    Hypertension    Liver lesion 08/13/2019   Liver mass 2014   biopsied 03/2013 at [facility], not malignant.  is to undergo radiologic ablation of the mass in sept/October 2014.    Lupus ([place])    Migraines    "maybe a couple times/yr" (04/01/2013)   Obesity    Obstructive sleep apnea on CPAP 2011   Oxygen at nights   SJOGREN'S SYNDROME 08/09/2010   SLE (systemic lupus erythematosus) ([place])    [facility] Endo 12/01/2010   Stroke ([place]) 2010; 10/2012   "left side is still weak from it, never fully regained full strength; no additions from stroke 10/2012"    Past Surgical History:  Procedure Laterality Date   BREAST CYST EXCISION Left 08/2005   epidermoid   CARDIAC CATHETERIZATION  02/07/12   ECTOPIC PREGNANCY SURGERY  1999   ECTOPIC PREGNANCY SURGERY  1999   ESOPHAGOGASTRODUODENOSCOPY N/A 02/26/2013   Procedure: ESOPHAGOGASTRODUODENOSCOPY (EGD);  Surgeon: Irene Shipper, MD;  Location: [facility] ENDOSCOPY;  Service: Endoscopy;  Laterality: N/A;   ESOPHAGOGASTRODUODENOSCOPY N/A 03/02/2015   Procedure: ESOPHAGOGASTRODUODENOSCOPY (EGD);  Surgeon: Milus Banister, MD;  Location: [place];  Service: Endoscopy;  Laterality: N/A;   HERNIA REPAIR     LEFT AND RIGHT HEART CATHETERIZATION WITH CORONARY ANGIOGRAM N/A 02/07/2012   Procedure:  LEFT AND RIGHT HEART CATHETERIZATION WITH CORONARY ANGIOGRAM;  Surgeon: Laverda Page, MD;  Location: [facility] CATH LAB;  Service: Cardiovascular;  Laterality: N/A;   LIVER BIOPSY  03/2013   liver mass/medical hx noted above   MUSCLE BIOPSY     for lupus/notes 04/06/1883   UMBILICAL HERNIA REPAIR  1980's    Prior to Admission medications   Medication Sig Start Date End Date Taking? Authorizing Provider  ADVAIR HFA 230-21 MCG/ACT inhaler Inhale 1-2 puffs into the lungs 2 (two) times daily. 12/20/20   [provider]  albuterol (PROVENTIL) (2.5 MG/3ML) 0.083% nebulizer solution Take 2.5 mg by nebulization every 6 (six) hours as needed for wheezing.    [provider]  albuterol (VENTOLIN HFA) 108 (90 Base) MCG/ACT inhaler Inhale 2 puffs into the lungs every 6 (six) hours as needed for wheezing or shortness of breath.    [provider]  ALPRAZolam Duanne Moron) 0.5 MG tablet Take 0.5 mg 2 (two) times daily as needed by mouth for anxiety or sleep.  04/19/17   [provider]  amLODipine (NORVASC) 10 MG tablet Take 1 tablet (10 mg total) by mouth daily. 01/01/21   Adrian Prows, MD  ARIPiprazole (ABILIFY) 5 MG tablet Take 5 mg by mouth daily. 12/08/20   [provider]  aspirin EC 81 MG tablet Take 81 mg by mouth daily. Swallow whole.    [provider]  azaTHIOprine (IMURAN) 50 MG tablet Take 150 mg by mouth in the morning and at bedtime.     [provider]  Azathioprine 75 MG TABS Take 150 mg by mouth 2 (two) times daily. 02/04/21   [provider]  B-D UF III MINI PEN NEEDLES 31G X 5 MM MISC USE AS DIRECTED TWICE A DAY Patient taking differently: 1 each by Other route in the morning and at bedtime. 10/31/18   Renato Shin, MD  Belimumab (BENLYSTA) 200 MG/ML SOAJ Inject 200 mg into the skin once a week. Thursdays 12/19/19   [provider]  benzonatate (TESSALON) 100 MG capsule Take 1 capsule by mouth 3 (three) times daily as needed for  cough. 05/23/19   [provider]  Blood Glucose Monitoring Suppl (ONETOUCH VERIO) w/Device KIT Test 4 times daily.  DX E11.9 04/27/20   Elayne Snare, MD  buPROPion (WELLBUTRIN XL) 300 MG 24 hr tablet Take 300 mg by mouth daily. 06/28/19   [provider]  carvedilol (COREG) 25 MG tablet Take 1.5 tablets (37.5 mg total) by mouth 2 (two) times daily with a meal. 01/01/21   Adrian Prows, MD  cetirizine (ZYRTEC) 10 MG tablet Take 10 mg by mouth daily as needed for allergies.     [provider]  clotrimazole-betamethasone (LOTRISONE) cream Apply 1 application topically 2 (two) times daily. Patient taking differently: Apply 1 application topically daily as needed (rash). 01/01/19   Renato Shin, MD  cromolyn (OPTICROM) 4 % ophthalmic solution Place 1 drop into both eyes 2 (two) times daily. 01/14/21   [provider]  cycloSPORINE (RESTASIS) 0.05 % ophthalmic emulsion  Place 1 drop into both eyes in the morning, at noon, and at bedtime.     [provider]  diclofenac Sodium (VOLTAREN) 1 % GEL Apply 2 g topically 4 (four) times daily. Patient taking differently: Apply 2 g topically daily as needed (pain). 09/18/19   Love, Ivan Anchors, PA-C  DULoxetine (CYMBALTA) 60 MG capsule Take 120 mg by mouth at bedtime. 08/31/20   [provider]  EASY TOUCH PEN NEEDLES 32G X 4 MM MISC USE TO INJECT INSULIN TWICE DAILY Patient taking differently: 1 each by Other route in the morning and at bedtime. 08/07/19   Renato Shin, MD  esomeprazole (NEXIUM) 40 MG capsule Take 1 capsule (40 mg total) by mouth daily. 02/12/21   Drenda Freeze, MD  famotidine (PEPCID) 20 MG tablet Take 20 mg by mouth 2 (two) times daily.    [provider]  fluticasone (FLONASE) 50 MCG/ACT nasal spray Place 2 sprays into both nostrils continuous as needed for allergies or rhinitis.    [provider]  glucose blood (ONETOUCH VERIO) test strip Test 4 times daily DX E11.9 04/27/20    Elayne Snare, MD  glucose blood test strip Test 4 times daily DX E11.9 04/27/20   Elayne Snare, MD  HUMALOG KWIKPEN 100 UNIT/ML KwikPen Inject 10-20 Units into the skin 3 (three) times daily. 12/20/20   [provider]  hydroxychloroquine (PLAQUENIL) 200 MG tablet Take 200 mg by mouth 2 (two) times daily.    [provider]  Insulin NPH, Human,, Isophane, (HUMULIN N KWIKPEN) 100 UNIT/ML Kiwkpen Inject 2.2 mLs (220 Units total) into the skin every morning 12/22/20   Renato Shin, MD  Insulin Syringe-Needle U-100 (EASY TOUCH INSULIN SYRINGE) 31G X 5/16" 1 ML MISC USE AS DIRECTED THREE TIMES A DAY Patient taking differently: 1 each by Other route in the morning, at noon, and at bedtime. 10/12/18   Renato Shin, MD  isosorbide-hydrALAZINE (BIDIL) 20-37.5 MG tablet TAKE TWO (2) TABLETS BY MOUTH THREE TIMES DAILY 01/01/21   Adrian Prows, MD  Lancets [facility] ULTRASOFT) lancets Test 4 times daily DX E11.9 04/27/20   Elayne Snare, MD  levothyroxine (SYNTHROID) 50 MCG tablet Take 1 tablet (50 mcg total) by mouth daily at 6 (six) AM. 08/30/19   Raiford Noble Latif, DO  liraglutide (VICTOZA) 18 MG/3ML SOPN Inject 1.8 mg into the skin every morning. 12/22/20   Renato Shin, MD  megestrol (MEGACE) 40 MG tablet Take 40 mg by mouth daily. 01/30/15   [provider]  metFORMIN (GLUCOPHAGE-XR) 500 MG 24 hr tablet Take 2 tablets (1,000 mg total) by mouth 2 (two) times daily. 02/02/21   Renato Shin, MD  Multiple Vitamin (MULTIVITAMIN WITH MINERALS) TABS tablet Take 1 tablet by mouth daily. 08/30/19   Raiford Noble Latif, DO  ondansetron (ZOFRAN ODT) 4 MG disintegrating tablet Take 1 tablet (4 mg total) by mouth every 8 (eight) hours as needed for nausea or vomiting. 02/17/21   Loni Beckwith, PA-C  oxybutynin (DITROPAN XL) 15 MG 24 hr tablet Take 15 mg by mouth daily.  08/30/17   [provider]  oxyCODONE-acetaminophen (PERCOCET/ROXICET) 5-325 MG tablet Take 1 tablet by mouth every 8 (eight)  hours as needed for up to 3 days for severe pain. 02/17/21 02/20/21  Loni Beckwith, PA-C  polyethylene glycol (MIRALAX / GLYCOLAX) 17 g packet Take 17 g by mouth daily. 02/12/21   Drenda Freeze, MD  pravastatin (PRAVACHOL) 10 MG tablet Take 1 tablet (10 mg total)  by mouth daily after supper. 01/01/21   Adrian Prows, MD  predniSONE (DELTASONE) 1 MG tablet Take 1 mg by mouth 2 (two) times daily. 04/21/17   [provider]  pregabalin (LYRICA) 75 MG capsule Take 75 mg 3 (three) times daily by mouth.  01/28/15   [provider]  QUEtiapine (SEROQUEL) 300 MG tablet Take 300 mg by mouth at bedtime. 01/08/21   [provider]  sacubitril-valsartan (ENTRESTO) 97-103 MG Take 1 tablet by mouth 2 (two) times daily. 01/21/20   Adrian Prows, MD  spironolactone (ALDACTONE) 50 MG tablet Take 1 tablet (50 mg total) by mouth daily. 01/01/21   Adrian Prows, MD  sucralfate (CARAFATE) 1 GM/10ML suspension Take 10 mLs by mouth 2 (two) times daily. 12/09/20   [provider]  topiramate (TOPAMAX) 25 MG tablet Take 75 mg by mouth at bedtime. 03/17/16   [provider]  torsemide (DEMADEX) 20 MG tablet Take 20 mg by mouth daily as needed for fluid. 09/12/19   [provider]    Scheduled Meds:  amLODipine  10 mg Oral Daily   ARIPiprazole  5 mg Oral Daily   azaTHIOprine  150 mg Oral BID   carvedilol  37.5 mg Oral BID WC   hydroxychloroquine  200 mg Oral BID   insulin aspart  0-15 Units Subcutaneous TID WC   insulin detemir  30 Units Subcutaneous BID   isosorbide-hydrALAZINE  2 tablet Oral TID   pantoprazole (PROTONIX) IV  40 mg Intravenous Q12H   polyethylene glycol  17 g Oral Daily   pravastatin  10 mg Oral QPC supper   predniSONE  1 mg Oral BID   pregabalin  75 mg Oral TID   sacubitril-valsartan  1 tablet Oral BID   senna-docusate  1 tablet Oral BID   sodium phosphate  1 enema Rectal Once   spironolactone  50 mg Oral Daily   topiramate  75 mg Oral QHS    Continuous Infusions:  sodium chloride 75 mL/hr at 02/19/21 1256   PRN Meds:.HYDROmorphone (DILAUDID) injection, labetalol, ondansetron (ZOFRAN) IV  Allergies as of 02/18/2021 - Review Complete 02/17/2021  Allergen Reaction Noted   Metoclopramide Other (See Comments) 04/25/2013   Codeine Itching, Rash, and Other (See Comments) 05/09/2013   Hydrocodone Rash 08/07/2016   Penicillins Itching    Percocet [oxycodone-acetaminophen] Hives and Rash 10/06/2016    Family History  Problem Relation Age of Onset   Diabetes Mother    Asthma Mother    Urticaria Mother    Hypertension Mother    Breast cancer Maternal Aunt 26       currently 38   Stomach cancer Maternal Uncle        dx 69s; deceased   Prostate cancer Maternal Uncle        deceased 20s   Cancer Maternal Aunt        unk. primary   Cancer Maternal Aunt        unk. primary   Ovarian cancer Maternal Aunt        deceased 22s   Hypertension Other    Stroke Other    Arthritis Other    Ovarian cancer Sister 55       maternal half-sister; RAD51C positive   Cancer Paternal Aunt        unk. primary; deceased 38s   Prostate cancer Paternal Uncle        deceased 67s   Breast cancer Cousin        deceased 54s;  daughter of mat uncle with stomach ca   Breast cancer Maternal Aunt        deceased 36s   Asthma Brother    Asthma Brother    Allergic rhinitis Neg Hx    Angioedema Neg Hx     Social History   Socioeconomic History   Marital status: Single    Spouse name: Not on file   Number of children: 1   Years of education: Not on file   Highest education level: Not on file  Occupational History    Employer: UNEMPLOYED    Comment: student  Tobacco Use   Smoking status: Former    Packs/day: 0.50    Years: 3.00    Pack years: 1.50    Types: Cigarettes    Quit date: 06/02/1991    Years since quitting: 29.7   Smokeless tobacco: Never   Tobacco comments:    quit 28 yrs ago  Vaping Use   Vaping Use: Never used   Substance and Sexual Activity   Alcohol use: No    Alcohol/week: 0.0 standard drinks   Drug use: No   Sexual activity: Yes    Partners: Male    Birth control/protection: None  Other Topics Concern   Not on file  Social History Narrative   Regular exercise-yes   Social Determinants of Health   Financial Resource Strain: Not on file  Food Insecurity: Not on file  Transportation Needs: Not on file  Physical Activity: Not on file  Stress: Not on file  Social Connections: Not on file  Intimate Partner Violence: Not on file    Review of Systems: Review of Systems  Constitutional:  Negative for chills and fever.  HENT:  Negative for hearing loss and tinnitus.   Eyes:  Negative for pain and redness.  Respiratory:  Negative for cough and shortness of breath.   Cardiovascular:  Negative for chest pain and palpitations.  Gastrointestinal:  Positive for abdominal pain, constipation, nausea and vomiting. Negative for blood in stool, diarrhea, heartburn and melena.  Genitourinary:  Negative for flank pain and hematuria.  Musculoskeletal:  Negative for falls and joint pain.  Skin:  Negative for itching and rash.  Neurological:  Negative for seizures and loss of consciousness.  Psychiatric/Behavioral:  Negative for substance abuse. The patient is not nervous/anxious.     Physical Exam: Vital signs: Vitals:   02/19/21 1145 02/19/21 1201  BP: (!) 152/83 (!) 160/77  Pulse:  97  Resp: 16 15  Temp:    SpO2:  100%      Physical Exam Vitals reviewed.  Constitutional:      General: She is not in acute distress.    Appearance: She is morbidly obese.  HENT:     Head: Normocephalic and atraumatic.     Nose: Nose normal. No congestion.     Mouth/Throat:     Mouth: Mucous membranes are moist.     Pharynx: Oropharynx is clear.  Eyes:     Extraocular Movements: Extraocular movements intact.     Conjunctiva/sclera: Conjunctivae normal.  Cardiovascular:     Rate and Rhythm: Normal  rate and regular rhythm.  Pulmonary:     Effort: Pulmonary effort is normal. No respiratory distress.  Abdominal:     General: Bowel sounds are normal. There is no distension.     Palpations: Abdomen is soft.     Tenderness: There is abdominal tenderness (mild, epigastric). There is no guarding or rebound.     Hernia: No  hernia is present.  Musculoskeletal:     Cervical back: Normal range of motion and neck supple.  Skin:    General: Skin is warm and dry.  Neurological:     Mental Status: She is alert and oriented to person, place, and time. Mental status is at baseline.     Comments: Left sided facial droop  Psychiatric:        Mood and Affect: Mood normal.        Behavior: Behavior normal. Behavior is cooperative.    GI:  Lab Results: Recent Labs    02/17/21 0449 02/18/21 1843 02/18/21 1851 02/19/21 0530  WBC 8.7 9.6  --  10.2  HGB 11.6* 12.3 13.3 11.3*  HCT 37.5 38.9 39.0 35.5*  PLT 201 230  --  212   BMET Recent Labs    02/17/21 0449 02/18/21 1843 02/18/21 1851 02/19/21 0530  NA 138 136 141 137  K 3.6 3.6 3.6 3.4*  CL 110 110 109 110  CO2 20* 19*  --  18*  GLUCOSE 265* 252* 251* 242*  BUN _0 CREATININE 0.93 0.86 0.70 0.88  CALCIUM 9.4 9.6  --  9.0   LFT Recent Labs    02/19/21 0530  PROT 6.7  ALBUMIN 3.5  AST 13*  ALT 12  ALKPHOS 81  BILITOT 0.8  BILIDIR <0.1  IBILI NOT CALCULATED   PT/INR Recent Labs    02/18/21 1843  LABPROT 13.5  INR 1.0     Studies/Results: CT ABDOMEN PELVIS WO CONTRAST  Result Date: 02/18/2021 CLINICAL DATA:  Bowel obstruction suspected Abdominal pain. EXAM: CT ABDOMEN AND PELVIS WITHOUT CONTRAST TECHNIQUE: Multidetector CT imaging of the abdomen and pelvis was performed following the standard protocol without IV contrast. COMPARISON:  CT 6 days ago 02/12/2021 FINDINGS: Lower chest: Subsegmental atelectasis/scarring in the left lower lobe. Mild cardiomegaly. Hepatobiliary: No focal hepatic abnormality is seen.  Gallbladder physiologically distended, no calcified stone. No biliary dilatation. Pancreas: No ductal dilatation or inflammation. Spleen: Normal in size without focal abnormality. Adrenals/Urinary Tract: No adrenal nodule. Excreted IV contrast in the renal collecting systems from head and neck CTA earlier today. No hydronephrosis. Partially distended urinary bladder. Stomach/Bowel: Physiologically distended stomach. There is no small bowel obstruction, ileus, or inflammation. Decompressed small bowel. Normal appendix. Moderate volume of colonic stool. No colonic wall thickening or pericolonic edema. Sigmoid colon minimal early redundant. Vascular/Lymphatic: Normal caliber abdominal aorta. No portal venous or mesenteric gas. No abdominopelvic adenopathy. Reproductive: Uterus and bilateral adnexa are unremarkable. Other: No ascites, free air or focal fluid collection. No abdominal wall hernia. Musculoskeletal: There are no acute or suspicious osseous abnormalities. IMPRESSION: 1. No acute abnormality or explanation for abdominal pain. No bowel obstruction. 2. Moderate colonic stool burden, can be seen with constipation. Electronically Signed   By: Keith Rake M.D.   On: 02/18/2021 21:27   MR BRAIN WO CONTRAST  Result Date: 02/19/2021 CLINICAL DATA:  Subarachnoid hemorrhage [facility]) suspected.  Headache. EXAM: MRI HEAD WITHOUT CONTRAST TECHNIQUE: Multiplanar, multiecho pulse sequences of the brain and surrounding structures were obtained without intravenous contrast. COMPARISON:  None. FINDINGS: Brain: No acute infarct, mass effect or extra-axial collection. No acute or chronic hemorrhage. Normal white matter signal, parenchymal volume and CSF spaces. Normal midline structures. Vascular: Major flow voids are preserved. Skull and upper cervical spine: Normal calvarium and skull base. Visualized upper cervical spine and soft tissues are normal. Sinuses/Orbits:No paranasal sinus fluid levels or advanced mucosal  thickening. No mastoid or  middle ear effusion. Normal orbits. IMPRESSION: 1. Normal brain.  No subarachnoid hemorrhage. Electronically Signed   By: Ulyses Jarred M.D.   On: 02/19/2021 02:12   CT HEAD CODE STROKE WO CONTRAST  Result Date: 02/18/2021 CLINICAL DATA:  Code stroke.  Neuro deficit, acute stroke suspected. EXAM: CT HEAD WITHOUT CONTRAST TECHNIQUE: Contiguous axial images were obtained from the base of the skull through the vertex without intravenous contrast. COMPARISON:  CT head 04/22/2016 FINDINGS: Brain: No evidence of acute large vascular territory infarction, hemorrhage, hydrocephalus, extra-axial collection or mass lesion/mass effect. Partially empty sella. Vascular: No hyperdense vessel identified. Skull: No acute fracture. Sinuses/Orbits: Polyps or retention cysts in bilateral maxillary sinuses. Opacified posterior right ethmoid air cell. Other: No mastoid effusions. ASPECTS [facility] Stroke Program Early CT Score) total score (0-10 with 10 being normal): 10. IMPRESSION: 1. No evidence of acute large vascular territory infarct or acute hemorrhage. ASPECTS is 10. 2. Partially empty sella, which is often a normal anatomic variant but can be associated with idiopathic intracranial hypertension (pseudotumor cerebri). Code stroke imaging results were communicated on 02/18/2021 at 7:07 pm to provider Dr. Quinn Axe via [facility] text paging. Electronically Signed   By: Margaretha Sheffield MD   On: 02/18/2021 19:09   CT ANGIO HEAD NECK W WO CM (CODE STROKE)  Result Date: 02/18/2021 CLINICAL DATA:  Neurological deficit, acute, stroke suspected. EXAM: CT ANGIOGRAPHY HEAD AND NECK TECHNIQUE: Multidetector CT imaging of the head and neck was performed using the standard protocol during bolus administration of intravenous contrast. Multiplanar CT image reconstructions and MIPs were obtained to evaluate the vascular anatomy. Carotid stenosis measurements (when applicable) are obtained utilizing NASCET criteria,  using the distal internal carotid diameter as the denominator. CONTRAST:  66m OMNIPAQUE IOHEXOL 350 MG/ML SOLN COMPARISON:  Head CT earlier same day. FINDINGS: CTA NECK FINDINGS Aortic arch: The arch does not show any atherosclerotic change. Branching pattern is normal. Right carotid system: Common carotid artery widely patent to the bifurcation. The carotid bifurcation is normal. Cervical ICA is normal. Left carotid system: Common carotid artery widely patent to the bifurcation. The bifurcation is normal. Cervical ICA is normal. Vertebral arteries: Both vertebral arteries are patent at their origins. These are small vessels. The degree of opacity is low and proximal vertebral arteries are poorly seen. In the upper cervical region, small vertebral arteries do appear to show flow to the foramen magnum. Skeleton: Mid cervical spondylosis. Other neck: No mass or lymphadenopathy. Upper chest: Normal Review of the MIP images confirms the above findings CTA HEAD FINDINGS Anterior circulation: Both internal carotid arteries are patent through the skull base and siphon regions. The anterior and middle cerebral vessels show flow. There is a persistent trigeminal artery on the right which gives the majority of the flow to the posterior circulation. Posterior circulation: Diminutive vertebral arteries are patent through the foramen magnum the proximal basilar. Persistent trigeminal artery on the right gives the majority of the supply to the posterior circulation. Distal basilar artery appears normal. Superior cerebellar and posterior cerebral arteries are normal. Venous sinuses: Patent and normal. Anatomic variants: None other significant.  See above. Review of the MIP images confirms the above findings IMPRESSION: No intracranial large vessel occlusion. No atherosclerotic vascular disease is demonstrated in the neck or in the head. 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 The vertebral arteries are quite diminutive on a  congenital basis. Electronically Signed   By: Nelson Chimes M.D.   On: 02/18/2021 19:34    Impression: Intractable nausea/vomiting: unclear etiology, possibly due to impairment of lower esophageal sphincter relaxation as seen on upper GI series.  Normal gastric emptying study in 12/2019. -Hgb 11.3, decreased from 13.3 yesterday, likely dilutional -Normal LFTs  Constipation x 8 days  Hepatic adenomas (dx 2014), ruptured hepatic angioma (3496)  Diastolic CHF  CVA with left-sided hemiplegia  SLE, Sjogren's syndrome   Obesity (BMI 55.98)  Plan: Continue full liquids as tolerated.  Discussed with Dr. Alessandra Bevels, who recommends HIDA scan to rule out biliary source of symptoms.  If symptoms persist, may need EGD later this weekend.  Continue Protonix. Continue supportive care and anti-emetics as needed.  Miralax TID for constipation  [facility] will follow.   LOS: 0 days   Salley Slaughter  PA-C 02/19/2021, 1:45 PM  Contact #  [phone]

## 2021-02-19 NOTE — Progress Notes (Signed)
Patient is admitted early this morning, detail please refer to HPI.  She report persistent nausea and vomiting for the last 2 weeks, she has generalized abdominal pain, also report difficulty swallowing, report feeling dehydrated, restart hydration, consult GI.

## 2021-02-19 NOTE — Progress Notes (Signed)
Neurology Progress Note  Subjective: - LP unsuccessful in ED [facility] ruled out by MRI brain - TTE showed no etiology for syncope - No new neurologic complaints - GI workup for persistent n/v pending  Interim data:  MRI brain wo contrast: WNL (personal review)  TTE  Echocardiogram 01/20/2021:  Mildly depressed LV systolic function with visual EF 45-50%. Left  ventricle cavity is mildly dilated. Moderate left ventricular hypertrophy.  Hypokinetic global wall motion.  Doppler evidence of grade I (impaired)  diastolic dysfunction, normal LAP.  Left atrial cavity is normal in size.  Mild (Grade I) mitral regurgitation.  Mild tricuspid regurgitation. No evidence of pulmonary hypertension.  Insignificant pericardial effusion.   Exam: Vitals:   02/19/21 1600 02/19/21 1658  BP: (!) 156/90 (!) 158/87  Pulse:  92  Resp: (!) 23 18  Temp:  98.4 F (36.9 C)  SpO2:  99%   Physical Exam Gen: A&O x4, anxious HEENT: Atraumatic except lip appears to be swollen and slightly bleeding, normocephalic;mucous membranes moist; oropharynx clear, tongue without atrophy or fasciculations. Neck: Supple, trachea midline. Resp: CTAB, no w/r/r CV: RRR, no m/g/r; nml S1 and S2. 2+ symmetric peripheral pulses. Abd: soft/NT/ND; nabs x 4 quad Extrem: Nml bulk; no cyanosis, clubbing, or edema.   Neuro: *MS: A&O x4. Follows multi-step commands. *Speech: fluid, nondysarthric, able to name and repeat *CN:   I: Deferred   II,III: PERRLA, VFF by confrontation, optic discs not visualized 2/2 pupillary constriction   III,IV,VI: EOMI w/o nystagmus, L ptosis   V: Sensation intact from V1 to V3 to LT   VII: Eyelid closure was full.  L upper and lower facial droop   VIII: Hearing intact to voice   IX,X: Voice normal, palate elevates symmetrically   XI: SCM/trap 5/5 bilat   XII: Tongue protrudes midline, no atrophy or fasciculations *Motor:   Normal bulk.  No tremor, rigidity or bradykinesia. RUE and RLE full  strength. Slight drift LUE with 4/5 grip strength. LLE some movement anti-gravity. *Sensory: Decreased to LT LUE and LLE. No double-simultaneous extinction. *Coordination:  FNF intact bilat *Reflexes:  1+ and symmetric throughout without clonus; toes down-going bilat *Gait: deferred  Impression: This is a 50 year old woman with a history of CHF, diabetes, hypertension, hyperlipidemia, lupus, stroke in 2010 with residual left-sided deficits, Bell's palsy in 2012 with residual drooping of her left upper and lower face who was brought in by EMS for abdominal pain and headache. SAH ruled out by MRI brain. LP no longer required. TTE showed no etiology for syncope.   Recommendations: - Telemetry, further syncope workup per primary team - No indication for EEG; seizure v low on differential and would not change mgmt at this time - GI workup per gastroenterology consulting - Headache improved, patient complaints today only of abd pain and n/v. No further inpatient neurologic workup indicated. Neurology to sign off, but please re-engage if additional neurologic concerns arise.  Su Monks, MD [facility] [phone]  If 7pm- 7am, please page neurology on call as listed in [place].

## 2021-02-20 DIAGNOSIS — E876 Hypokalemia: Secondary | ICD-10-CM

## 2021-02-20 DIAGNOSIS — I5032 Chronic diastolic (congestive) heart failure: Secondary | ICD-10-CM

## 2021-02-20 LAB — GLUCOSE, CAPILLARY
Glucose-Capillary: 218 mg/dL — ABNORMAL HIGH (ref 70–99)
Glucose-Capillary: 194 mg/dL — ABNORMAL HIGH (ref 70–99)
Glucose-Capillary: 207 mg/dL — ABNORMAL HIGH (ref 70–99)
Glucose-Capillary: 141 mg/dL — ABNORMAL HIGH (ref 70–99)

## 2021-02-20 LAB — BASIC METABOLIC PANEL
Anion gap: 6 (ref 5–15)
BUN: 10 mg/dL (ref 6–20)
CO2: 24 mmol/L (ref 22–32)
Calcium: 9 mg/dL (ref 8.9–10.3)
Chloride: 109 mmol/L (ref 98–111)
Creatinine, Ser: 0.85 mg/dL (ref 0.44–1.00)
GFR, Estimated: >60 mL/min (ref >60)
Glucose, Bld: 189 mg/dL — ABNORMAL HIGH (ref 70–99)
Potassium: 3.1 mmol/L — ABNORMAL LOW (ref 3.5–5.1)
Sodium: 139 mmol/L (ref 135–145)

## 2021-02-20 LAB — MAGNESIUM: Magnesium: 1.7 mg/dL (ref 1.7–2.4)

## 2021-02-20 MED ORDER — POTASSIUM CHLORIDE CRYS ER 20 MEQ PO TBCR
40.0000 meq | EXTENDED_RELEASE_TABLET | ORAL | Status: AC
Start: 2021-02-20 — End: 2021-02-20
  Administered 2021-02-20 (×2): 40 meq via ORAL
  Filled 2021-02-20 (×2): qty 2

## 2021-02-20 MED ORDER — ACETAMINOPHEN 650 MG RE SUPP
650.0000 mg | Freq: Four times a day (QID) | RECTAL | Status: DC | PRN
  Administered 2021-02-20: 650 mg via RECTAL
  Filled 2021-02-20: qty 1

## 2021-02-20 MED ORDER — ENOXAPARIN SODIUM 80 MG/0.8ML IJ SOSY
80.0000 mg | PREFILLED_SYRINGE | INTRAMUSCULAR | Status: DC
  Administered 2021-02-20 – 2021-02-25 (×6): 80 mg via SUBCUTANEOUS
  Filled 2021-02-20 (×6): qty 0.8

## 2021-02-20 MED ORDER — SODIUM CHLORIDE 0.9 % IV SOLN: INTRAVENOUS | Status: AC

## 2021-02-20 MED ORDER — MAGNESIUM SULFATE 2 GM/50ML IV SOLN
2.0000 g | Freq: Once | INTRAVENOUS | Status: AC
  Administered 2021-02-20: 2 g via INTRAVENOUS
  Filled 2021-02-20: qty 50

## 2021-02-20 NOTE — Progress Notes (Signed)
RT discussed CPAP w/pt. Pt states she is very nauseated at this time. RT asked to have RN call when her nausea settles and she feels like she can tolerate her CPAP. RT will continue to monitor.

## 2021-02-20 NOTE — Progress Notes (Addendum)
PROGRESS NOTE    [name]  [name] DOB: [birth year] DOA: 02/18/2021 PCP: Bartholome Bill, MD    No chief complaint on file.   Brief Narrative:  [name] is a 50 y.o. female with history of lupus and Sjogren's syndrome, chronic systolic heart failure with improved EF, hypertension, sleep apnea, previous stroke with left-sided hemiplegia and history of Bell's palsy presented to the ER yesterday with complaints of having intractable nausea vomiting ongoing for the last 2 weeks and severe headache  Subjective:  C/o n/v/ ab pain She lost her iv access, awaiting to get another iv  Assessment & Plan:   Principal Problem:   Nausea & vomiting Active Problems:   SLE   Hypertension complicating diabetes ([place])   Chronic diastolic CHF (congestive heart failure) (HCC)   Worst headache of life   Type 2 diabetes mellitus with hyperlipidemia (HCC)   N&V (nausea and vomiting)   Migraine without aura and without status migrainosus, not intractable   Nausea/vomiting -CT abdomen no acute findings other than showed constipation -IV hydration, antiemetics -GI consulted who recommended HIDA scan/EGD, will follow GI recommendation  History of hepatoma s/p embolization LFT unremarkable  Hypokalemia/hypomagnesemia Replace K  Headache Seen by neurology ,CT angio head and neck and MRI brain negative Did not complain of headache today  Chronic systolic CHF Currently appears dehydrated, continue hydration  Insulin-dependent type 2 diabetes Poor oral intake last 2 weeks Adjust insulin  History of SLE/Sjogren's Continue home regimen Imuran/prednisone/Plaquenil  OSA on CPAP   Body mass index is 55.03 kg/m.Marland Kitchen      Skin Assessment:  I have examined the patient's skin and I agree with the wound assessment as performed by the wound care RN as outlined below:  Pressure Injury 08/29/19 Buttocks Left Stage 2 -  Partial thickness loss of dermis presenting as a shallow  open injury with a red, pink wound bed without slough. pink, open area (Active)  08/29/19 2157  Location: Buttocks  Location Orientation: Left  Staging: Stage 2 -  Partial thickness loss of dermis presenting as a shallow open injury with a red, pink wound bed without slough.  Wound Description (Comments): pink, open area  Present on Admission: Yes    Unresulted Labs (From admission, onward)     Start     Ordered   02/21/21 [id number]  Basic metabolic panel  Daily,   R      02/20/21 1919   02/21/21 0500  Magnesium  Tomorrow morning,   R        02/20/21 1919   02/19/21 0818  Urine rapid drug screen (hosp performed)  ONCE - STAT,   STAT        02/19/21 0817              DVT prophylaxis: SCDs Start: 02/19/21 0531   Code Status: Full Family Communication: Patient Disposition:   Status is: Inpatient  Dispo: The patient is from: Home              Anticipated d/c is to: Home              Anticipated d/c date is: To be determined               Consultants:  Neurology GI  Procedures:  None  Antimicrobials:    None    Objective: Vitals:   02/20/21 0040 02/20/21 0508 02/20/21 0914 02/20/21 1840  BP: (!) 155/83 (!) 164/83 (!) 169/93 (!) 173/70  Pulse:  88 95 87 98  Resp: '19 20 18 20  '$ Temp: 98.2 F (36.8 C) 98.4 F (36.9 C) 98.3 F (36.8 C) 99.8 F (37.7 C)  TempSrc: Oral Oral Oral   SpO2: 96% 97% 95% 93%  Weight:  (!) 164.2 kg      Intake/Output Summary (Last 24 hours) at 02/20/2021 1919 Last data filed at 02/20/2021 1700 Gross per 24 hour  Intake 2187.17 ml  Output --  Net 2187.17 ml   Filed Weights   02/20/21 0508  Weight: (!) 164.2 kg    Examination:  General exam: calm, NAD Respiratory system: Clear to auscultation. Respiratory effort normal. Cardiovascular system: S1 & S2 heard, RRR. No JVD, no murmur, No pedal edema. Gastrointestinal system: Protuberant abdomen , questionable tenderness , Normal bowel sounds heard. Central nervous system: Alert  and oriented. No focal neurological deficits. Extremities: Left-sided facial droop from previous Bell's palsy, left hemiplegia from previous stroke Skin: No rashes, lesions or ulcers Psychiatry: Judgement and insight appear normal. Mood & affect appropriate.     Data Reviewed: I have personally reviewed following labs and imaging studies  CBC: Recent Labs  Lab 02/17/21 0449 02/18/21 1843 02/18/21 1851 02/19/21 0530  WBC 8.7 9.6  --  10.2  NEUTROABS  --  6.2  --  6.9  HGB 11.6* 12.3 13.3 11.3*  HCT 37.5 38.9 39.0 35.5*  MCV 90.4 89.2  --  88.8  PLT 201 230  --  99991111    Basic Metabolic Panel: Recent Labs  Lab 02/17/21 0449 02/18/21 1843 02/18/21 1851 02/19/21 0530 02/20/21 1000  NA 138 136 141 137 139  K 3.6 3.6 3.6 3.4* 3.1*  CL 110 110 109 110 109  CO2 20* 19*  --  18* 24  GLUCOSE 265* 252* 251* 242* 189*  BUN '9 7 7 7 10  '$ CREATININE 0.93 0.86 0.70 0.88 0.85  CALCIUM 9.4 9.6  --  9.0 9.0  MG  --   --   --   --  1.7    GFR: Estimated Creatinine Clearance: 130 mL/min (by C-G formula based on SCr of 0.85 mg/dL).  Liver Function Tests: Recent Labs  Lab 02/18/21 1843 02/19/21 0530  AST 16 13*  ALT 12 12  ALKPHOS 92 81  BILITOT 0.7 0.8  PROT 7.2 6.7  ALBUMIN 3.8 3.5    CBG: Recent Labs  Lab 02/19/21 1158 02/19/21 2344 02/20/21 0638 02/20/21 1158 02/20/21 1625  GLUCAP 275* 186* 218* 194* 207*     Recent Results (from the past 240 hour(s))  SARS CORONAVIRUS 2 (TAT 6-24 HRS) Nasopharyngeal Nasopharyngeal Swab     Status: None   Collection Time: 02/19/21  3:20 AM   Specimen: Nasopharyngeal Swab  Result Value Ref Range Status   SARS Coronavirus 2 NEGATIVE NEGATIVE Final    Comment: (NOTE) SARS-CoV-2 target nucleic acids are NOT DETECTED.  The SARS-CoV-2 RNA is generally detectable in upper and lower respiratory specimens during the acute phase of infection. Negative results do not preclude SARS-CoV-2 infection, do not rule out co-infections with  other pathogens, and should not be used as the sole basis for treatment or other patient management decisions. Negative results must be combined with clinical observations, patient history, and epidemiological information. The expected result is Negative.  Fact Sheet for Patients: SugarRoll.be  Fact Sheet for Healthcare Providers: https://www.woods-mathews.com/  This test is not yet approved or cleared by the [place] FDA and  has been authorized for detection and/or diagnosis of SARS-CoV-2 by FDA  under an Emergency Use Authorization (EUA). This EUA will remain  in effect (meaning this test can be used) for the duration of the COVID-19 declaration under Se ction 564(b)(1) of the Act, 21 U.S.C. section 360bbb-3(b)(1), unless the authorization is terminated or revoked sooner.  Performed at [facility], [place] [address], [place], [place] [address]          Radiology Studies: CT ABDOMEN PELVIS WO CONTRAST  Result Date: 02/18/2021 CLINICAL DATA:  Bowel obstruction suspected Abdominal pain. EXAM: CT ABDOMEN AND PELVIS WITHOUT CONTRAST TECHNIQUE: Multidetector CT imaging of the abdomen and pelvis was performed following the standard protocol without IV contrast. COMPARISON:  CT 6 days ago 02/12/2021 FINDINGS: Lower chest: Subsegmental atelectasis/scarring in the left lower lobe. Mild cardiomegaly. Hepatobiliary: No focal hepatic abnormality is seen. Gallbladder physiologically distended, no calcified stone. No biliary dilatation. Pancreas: No ductal dilatation or inflammation. Spleen: Normal in size without focal abnormality. Adrenals/Urinary Tract: No adrenal nodule. Excreted IV contrast in the renal collecting systems from head and neck CTA earlier today. No hydronephrosis. Partially distended urinary bladder. Stomach/Bowel: Physiologically distended stomach. There is no small bowel obstruction, ileus, or inflammation. Decompressed small  bowel. Normal appendix. Moderate volume of colonic stool. No colonic wall thickening or pericolonic edema. Sigmoid colon minimal early redundant. Vascular/Lymphatic: Normal caliber abdominal aorta. No portal venous or mesenteric gas. No abdominopelvic adenopathy. Reproductive: Uterus and bilateral adnexa are unremarkable. Other: No ascites, free air or focal fluid collection. No abdominal wall hernia. Musculoskeletal: There are no acute or suspicious osseous abnormalities. IMPRESSION: 1. No acute abnormality or explanation for abdominal pain. No bowel obstruction. 2. Moderate colonic stool burden, can be seen with constipation. Electronically Signed   By: Keith Rake M.D.   On: 02/18/2021 21:27   MR BRAIN WO CONTRAST  Result Date: 02/19/2021 CLINICAL DATA:  Subarachnoid hemorrhage [facility]) suspected.  Headache. EXAM: MRI HEAD WITHOUT CONTRAST TECHNIQUE: Multiplanar, multiecho pulse sequences of the brain and surrounding structures were obtained without intravenous contrast. COMPARISON:  None. FINDINGS: Brain: No acute infarct, mass effect or extra-axial collection. No acute or chronic hemorrhage. Normal white matter signal, parenchymal volume and CSF spaces. Normal midline structures. Vascular: Major flow voids are preserved. Skull and upper cervical spine: Normal calvarium and skull base. Visualized upper cervical spine and soft tissues are normal. Sinuses/Orbits:No paranasal sinus fluid levels or advanced mucosal thickening. No mastoid or middle ear effusion. Normal orbits. IMPRESSION: 1. Normal brain.  No subarachnoid hemorrhage. Electronically Signed   By: Ulyses Jarred M.D.   On: 02/19/2021 02:12   CT HEAD CODE STROKE WO CONTRAST  Result Date: 02/18/2021 CLINICAL DATA:  Code stroke.  Neuro deficit, acute stroke suspected. EXAM: CT HEAD WITHOUT CONTRAST TECHNIQUE: Contiguous axial images were obtained from the base of the skull through the vertex without intravenous contrast. COMPARISON:  CT head  04/22/2016 FINDINGS: Brain: No evidence of acute large vascular territory infarction, hemorrhage, hydrocephalus, extra-axial collection or mass lesion/mass effect. Partially empty sella. Vascular: No hyperdense vessel identified. Skull: No acute fracture. Sinuses/Orbits: Polyps or retention cysts in bilateral maxillary sinuses. Opacified posterior right ethmoid air cell. Other: No mastoid effusions. ASPECTS [facility] Stroke Program Early CT Score) total score (0-10 with 10 being normal): 10. IMPRESSION: 1. No evidence of acute large vascular territory infarct or acute hemorrhage. ASPECTS is 10. 2. Partially empty sella, which is often a normal anatomic variant but can be associated with idiopathic intracranial hypertension (pseudotumor cerebri). Code stroke imaging results were communicated on 02/18/2021 at 7:07 pm  to provider Dr. Quinn Axe via [facility] text paging. Electronically Signed   By: Margaretha Sheffield MD   On: 02/18/2021 19:09   CT ANGIO HEAD NECK W WO CM (CODE STROKE)  Result Date: 02/18/2021 CLINICAL DATA:  Neurological deficit, acute, stroke suspected. EXAM: CT ANGIOGRAPHY HEAD AND NECK TECHNIQUE: Multidetector CT imaging of the head and neck was performed using the standard protocol during bolus administration of intravenous contrast. Multiplanar CT image reconstructions and MIPs were obtained to evaluate the vascular anatomy. Carotid stenosis measurements (when applicable) are obtained utilizing NASCET criteria, using the distal internal carotid diameter as the denominator. CONTRAST:  55m OMNIPAQUE IOHEXOL 350 MG/ML SOLN COMPARISON:  Head CT earlier same day. FINDINGS: CTA NECK FINDINGS Aortic arch: The arch does not show any atherosclerotic change. Branching pattern is normal. Right carotid system: Common carotid artery widely patent to the bifurcation. The carotid bifurcation is normal. Cervical ICA is normal. Left carotid system: Common carotid artery widely patent to the bifurcation. The bifurcation  is normal. Cervical ICA is normal. Vertebral arteries: Both vertebral arteries are patent at their origins. These are small vessels. The degree of opacity is low and proximal vertebral arteries are poorly seen. In the upper cervical region, small vertebral arteries do appear to show flow to the foramen magnum. Skeleton: Mid cervical spondylosis. Other neck: No mass or lymphadenopathy. Upper chest: Normal Review of the MIP images confirms the above findings CTA HEAD FINDINGS Anterior circulation: Both internal carotid arteries are patent through the skull base and siphon regions. The anterior and middle cerebral vessels show flow. There is a persistent trigeminal artery on the right which gives the majority of the flow to the posterior circulation. Posterior circulation: Diminutive vertebral arteries are patent through the foramen magnum the proximal basilar. Persistent trigeminal artery on the right gives the majority of the supply to the posterior circulation. Distal basilar artery appears normal. Superior cerebellar and posterior cerebral arteries are normal. Venous sinuses: Patent and normal. Anatomic variants: None other significant.  See above. Review of the MIP images confirms the above findings IMPRESSION: No intracranial large vessel occlusion. No atherosclerotic vascular disease is demonstrated in the neck or in the head. 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 The vertebral arteries are quite diminutive on a congenital basis. Electronically Signed   By: MNelson ChimesM.D.   On: 02/18/2021 19:34        Scheduled Meds:  amLODipine  10 mg Oral Daily   ARIPiprazole  5 mg Oral Daily   azaTHIOprine  150 mg Oral BID   carvedilol  37.5 mg Oral BID WC   enoxaparin (LOVENOX) injection  80 mg Subcutaneous Q24H   hydroxychloroquine  200 mg Oral BID   insulin  aspart  0-15 Units Subcutaneous TID WC   insulin detemir  30 Units Subcutaneous BID   isosorbide-hydrALAZINE  2 tablet Oral TID   pantoprazole (PROTONIX) IV  40 mg Intravenous Q12H   polyethylene glycol  17 g Oral TID   pravastatin  10 mg Oral QPC supper   predniSONE  1 mg Oral BID   pregabalin  75 mg Oral TID   sacubitril-valsartan  1 tablet Oral BID   senna-docusate  1 tablet Oral BID   sodium phosphate  1 enema Rectal Once   spironolactone  50 mg Oral Daily   topiramate  75 mg Oral QHS   Continuous  Infusions:  promethazine (PHENERGAN) injection (IM or IVPB)       LOS: 1 day   Time spent: 21mns Greater than 50% of this time was spent in counseling, explanation of diagnosis, planning of further management, and coordination of care.   Voice Recognition /Viviann Sparedictation system was used to create this note, attempts have been made to correct errors. Please contact the author with questions and/or clarifications.   FFlorencia Reasons MD PhD FACP [facility]  Available via Epic secure chat 7am-7pm for nonurgent issues Please page for urgent issues To page the attending provider between 7A-7P or the covering provider during after hours 7P-7A, please log into the web site [email or web address] and access using universal [place] password for that web site. If you do not have the password, please call the hospital operator.    02/20/2021, 7:19 PM

## 2021-02-20 NOTE — Plan of Care (Signed)
HIDA scan pending.  Continue for now supportive care (Miralax tid), advance diet as tolerated (once HIDA done).  [facility] will revisit Monday (or sooner, if HIDA scan done).

## 2021-02-20 NOTE — Progress Notes (Signed)
Overnight progress note  Febrile on vital signs done tonight.  Temperature 101.4 on initial check and 100.4 on repeat.  Not tachycardic or hypotensive.    Chest x-ray done at the time of admission was not suggestive of pneumonia.  No leukocytosis on labs.  Given complaints of headache at the time of admission, LP was attempted and was unsuccessful; SAH ruled out by brain MRI and neurology did not recommend any further neurologic work-up.    No headaches or meningeal signs at this time.  -Tylenol as needed for fevers -Stat labs ordered: CBC, lactate, procalcitonin -Blood culture x2 -UA

## 2021-02-21 ENCOUNTER — Inpatient Hospital Stay (HOSPITAL_COMMUNITY): Payer: Medicaid Other

## 2021-02-21 LAB — BASIC METABOLIC PANEL
Anion gap: 14 (ref 5–15)
BUN: 9 mg/dL (ref 6–20)
CO2: 17 mmol/L — ABNORMAL LOW (ref 22–32)
Calcium: 9.2 mg/dL (ref 8.9–10.3)
Chloride: 107 mmol/L (ref 98–111)
Creatinine, Ser: 0.93 mg/dL (ref 0.44–1.00)
GFR, Estimated: >60 mL/min (ref >60)
Glucose, Bld: 154 mg/dL — ABNORMAL HIGH (ref 70–99)
Potassium: 3.4 mmol/L — ABNORMAL LOW (ref 3.5–5.1)
Sodium: 138 mmol/L (ref 135–145)

## 2021-02-21 LAB — GLUCOSE, CAPILLARY
Glucose-Capillary: 235 mg/dL — ABNORMAL HIGH (ref 70–99)
Glucose-Capillary: 192 mg/dL — ABNORMAL HIGH (ref 70–99)
Glucose-Capillary: 208 mg/dL — ABNORMAL HIGH (ref 70–99)
Glucose-Capillary: 192 mg/dL — ABNORMAL HIGH (ref 70–99)

## 2021-02-21 LAB — BLOOD CULTURE ID PANEL (REFLEXED) - BCID2

## 2021-02-21 LAB — LACTIC ACID, PLASMA: Lactic Acid, Venous: 1.5 mmol/L (ref 0.5–1.9)

## 2021-02-21 LAB — CBC
HCT: 33.6 % — ABNORMAL LOW (ref 36.0–46.0)
Hemoglobin: 10.9 g/dL — ABNORMAL LOW (ref 12.0–15.0)
MCH: 28.2 pg (ref 26.0–34.0)
MCHC: 32.4 g/dL (ref 30.0–36.0)
MCV: 86.8 fL (ref 80.0–100.0)
Platelets: UNDETERMINED 10*3/uL (ref 150–400)
RBC: 3.87 MIL/uL (ref 3.87–5.11)
RDW: 13.8 % (ref 11.5–15.5)
WBC: 10.1 10*3/uL (ref 4.0–10.5)
nRBC: 0 % (ref 0.0–0.2)

## 2021-02-21 LAB — URINALYSIS, COMPLETE (UACMP) WITH MICROSCOPIC
Bilirubin Urine: NEGATIVE
Glucose, UA: 150 mg/dL — AB
Ketones, ur: 5 mg/dL — AB
Leukocytes,Ua: NEGATIVE
Nitrite: NEGATIVE
Protein, ur: 30 mg/dL — AB
Specific Gravity, Urine: 1.035 — ABNORMAL HIGH (ref 1.005–1.030)
pH: 5 (ref 5.0–8.0)

## 2021-02-21 LAB — RAPID URINE DRUG SCREEN, HOSP PERFORMED
Amphetamines: NOT DETECTED
Barbiturates: NOT DETECTED
Benzodiazepines: NOT DETECTED
Cocaine: NOT DETECTED
Opiates: POSITIVE — AB
Tetrahydrocannabinol: NOT DETECTED

## 2021-02-21 LAB — MAGNESIUM: Magnesium: 1.9 mg/dL (ref 1.7–2.4)

## 2021-02-21 LAB — PROCALCITONIN: Procalcitonin: <0.1 ng/mL

## 2021-02-21 MED ORDER — FLEET ENEMA 7-19 GM/118ML RE ENEM
1.0000 | ENEMA | Freq: Once | RECTAL | Status: AC
  Administered 2021-02-21: 1 via RECTAL
  Filled 2021-02-21: qty 1

## 2021-02-21 MED ORDER — GUAIFENESIN ER 600 MG PO TB12
1200.0000 mg | ORAL_TABLET | Freq: Once | ORAL | Status: AC
  Administered 2021-02-21: 1200 mg via ORAL
  Filled 2021-02-21: qty 2

## 2021-02-21 MED ORDER — GUAIFENESIN ER 600 MG PO TB12
1200.0000 mg | ORAL_TABLET | Freq: Two times a day (BID) | ORAL | Status: DC
  Administered 2021-02-21 – 2021-02-25 (×8): 1200 mg via ORAL
  Filled 2021-02-21 (×8): qty 2

## 2021-02-21 NOTE — Progress Notes (Signed)
PHARMACY - PHYSICIAN COMMUNICATION CRITICAL VALUE ALERT - BLOOD CULTURE IDENTIFICATION (BCID)  [name] is an 50 y.o. female who presented to [facility] on 02/18/2021 with a chief complaint of n/v  Assessment:  1/2 BC with staph species, no resiantance  Name of physician (or Provider) Contacted: [place]  Current antibiotics: none  Changes to prescribed antibiotics recommended:  Observe for now  Results for orders placed or performed during the hospital encounter of 02/18/21  Blood Culture ID Panel (Reflexed) (Collected: 02/20/2021 10:50 PM)  Result Value Ref Range   Enterococcus faecalis NOT DETECTED NOT DETECTED   Enterococcus Faecium NOT DETECTED NOT DETECTED   Listeria monocytogenes NOT DETECTED NOT DETECTED   Staphylococcus species DETECTED (A) NOT DETECTED   Staphylococcus aureus (BCID) NOT DETECTED NOT DETECTED   Staphylococcus epidermidis NOT DETECTED NOT DETECTED   Staphylococcus lugdunensis NOT DETECTED NOT DETECTED   Streptococcus species NOT DETECTED NOT DETECTED   Streptococcus agalactiae NOT DETECTED NOT DETECTED   Streptococcus pneumoniae NOT DETECTED NOT DETECTED   Streptococcus pyogenes NOT DETECTED NOT DETECTED   A.calcoaceticus-baumannii NOT DETECTED NOT DETECTED   Bacteroides fragilis NOT DETECTED NOT DETECTED   Enterobacterales NOT DETECTED NOT DETECTED   Enterobacter cloacae complex NOT DETECTED NOT DETECTED   Escherichia coli NOT DETECTED NOT DETECTED   Klebsiella aerogenes NOT DETECTED NOT DETECTED   Klebsiella oxytoca NOT DETECTED NOT DETECTED   Klebsiella pneumoniae NOT DETECTED NOT DETECTED   Proteus species NOT DETECTED NOT DETECTED   Salmonella species NOT DETECTED NOT DETECTED   Serratia marcescens NOT DETECTED NOT DETECTED   Haemophilus influenzae NOT DETECTED NOT DETECTED   Neisseria meningitidis NOT DETECTED NOT DETECTED   Pseudomonas aeruginosa NOT DETECTED NOT DETECTED   Stenotrophomonas maltophilia NOT DETECTED NOT DETECTED   Candida  albicans NOT DETECTED NOT DETECTED   Candida auris NOT DETECTED NOT DETECTED   Candida glabrata NOT DETECTED NOT DETECTED   Candida krusei NOT DETECTED NOT DETECTED   Candida parapsilosis NOT DETECTED NOT DETECTED   Candida tropicalis NOT DETECTED NOT DETECTED   Cryptococcus neoformans/gattii NOT DETECTED NOT DETECTED    Beverlee Nims 02/21/2021  10:39 PM

## 2021-02-21 NOTE — Progress Notes (Signed)
On provider made aware.     02/21/21 0122  Vitals  Temp (!) 101.2 F (38.4 C)  BP (!) 177/78  MAP (mmHg) 103  BP Location Left Arm

## 2021-02-21 NOTE — Plan of Care (Signed)
  Problem: Clinical Measurements: Goal: Diagnostic test results will improve Outcome: Progressing   

## 2021-02-21 NOTE — Progress Notes (Signed)
PROGRESS NOTE    [name]  [id number] DOB: [birth year] DOA: 02/18/2021 PCP: Bartholome Bill, MD    No chief complaint on file.   Brief Narrative:  [name] is a 50 y.o. female with history of lupus and Sjogren's syndrome, chronic systolic heart failure with improved EF, hypertension, sleep apnea, previous stroke with left-sided hemiplegia and history of Bell's palsy presented to the ER yesterday with complaints of having intractable nausea vomiting ongoing for the last 2 weeks and severe headache  Subjective:  Spiked fever last night, she does not appear septic though, blood culture /ua/cxr ordered  Continue to c/o feeling nauseous, last vomited yesterday,  C/o coughing  No bm for 10 days, she has not received enema which was ordered on 7/22, she would like to have the enema  She has trouble swallow pills , she wants pills to be crushed   Assessment & Plan:   Principal Problem:   Nausea & vomiting Active Problems:   SLE   Hypertension complicating diabetes ([place])   Chronic diastolic CHF (congestive heart failure) (HCC)   Worst headache of life   Type 2 diabetes mellitus with hyperlipidemia (HCC)   N&V (nausea and vomiting)   Migraine without aura and without status migrainosus, not intractable   Nausea/vomiting, also reports dysphagia  -CT abdomen no acute findings other than showed constipation -UDS ordered in the ED , does not appear have collected, not sure will be helpful if collected now, but will reorder  -enema ordered in the ED but was not given, made RN aware to give today -IV hydration, antiemetics -GI consulted who recommended HIDA scan/EGD, will follow GI recommendation  History of hepatoma s/p embolization LFT unremarkable  Hypokalemia/hypomagnesemia Replace K/mag  Headache Seen by neurology ,CT angio head and neck and MRI brain negative headache appear has resolved   Chronic systolic CHF Currently appears dehydrated on presentation,  she received gentle hydration Close monitor volume status   Insulin-dependent type 2 diabetes Poor oral intake last 2 weeks Adjust insulin  History of SLE/Sjogren's Continue home regimen Imuran/prednisone/Plaquenil  OSA on CPAP Class III obesity  Body mass index is 55.03 kg/m.Marland Kitchen    Fever on 7/23-7/24 night She does not appear septic, follow up on cultures Hold off on abx  Skin Assessment:  I have examined the patient's skin and I agree with the wound assessment as performed by the wound care RN as outlined below:  Pressure Injury 08/29/19 Buttocks Left Stage 2 -  Partial thickness loss of dermis presenting as a shallow open injury with a red, pink wound bed without slough. pink, open area (Active)  08/29/19 2157  Location: Buttocks  Location Orientation: Left  Staging: Stage 2 -  Partial thickness loss of dermis presenting as a shallow open injury with a red, pink wound bed without slough.  Wound Description (Comments): pink, open area  Present on Admission: Yes    Unresulted Labs (From admission, onward)     Start     Ordered   02/21/21 0917  Urine Culture  Once,   R       Question Answer Comment  Indication Dysuria   Patient immune status Immunocompromised      02/21/21 0917   02/21/21 [id number]  Basic metabolic panel  Daily,   R      02/20/21 1919   02/20/21 2139  Urinalysis, Complete w Microscopic  ONCE - STAT,   STAT        02/20/21 2139  02/19/21 0818  Urine rapid drug screen (hosp performed)  ONCE - STAT,   STAT        02/19/21 0817              DVT prophylaxis: SCDs Start: 02/19/21 0531   Code Status: Full Family Communication: Patient Disposition:   Status is: Inpatient  Dispo: The patient is from: Home              Anticipated d/c is to: Home              Anticipated d/c date is: To be determined               Consultants:  Neurology GI  Procedures:  None  Antimicrobials:   None    Objective: Vitals:   02/20/21 2114 02/21/21  0122 02/21/21 0436 02/21/21 0952  BP:  (!) 177/78 (!) 166/86 (!) 155/75  Pulse:  86 (!) 108 90  Resp:  '17 19 18  '$ Temp: (!) 100.4 F (38 C) (!) 101.2 F (38.4 C) 100.2 F (37.9 C) 99.3 F (37.4 C)  TempSrc: Oral  Oral Oral  SpO2:  91% 91% 92%  Weight:        Intake/Output Summary (Last 24 hours) at 02/21/2021 1515 Last data filed at 02/21/2021 1016 Gross per 24 hour  Intake 719.57 ml  Output --  Net 719.57 ml   Filed Weights   02/20/21 0508  Weight: (!) 164.2 kg    Examination:  General exam: calm, NAD Respiratory system: Clear to auscultation. Respiratory effort normal. Cardiovascular system: S1 & S2 heard, RRR.  No pedal edema. Gastrointestinal system: Protuberant abdomen , diffuse mild tender, soft, no guarding , Normal bowel sounds heard. Central nervous system: Alert and oriented. No focal neurological deficits. Extremities: Left-sided facial droop from previous Bell's palsy, left hemiplegia from previous stroke Skin: No rashes, lesions or ulcers Psychiatry: Judgement and insight appear normal. Mood & affect appropriate.     Data Reviewed: I have personally reviewed following labs and imaging studies  CBC: Recent Labs  Lab 02/17/21 0449 02/18/21 1843 02/18/21 1851 02/19/21 0530 02/20/21 2319  WBC 8.7 9.6  --  10.2 10.1  NEUTROABS  --  6.2  --  6.9  --   HGB 11.6* 12.3 13.3 11.3* 10.9*  HCT 37.5 38.9 39.0 35.5* 33.6*  MCV 90.4 89.2  --  88.8 86.8  PLT 201 230  --  212 PLATELET CLUMPS NOTED ON SMEAR, UNABLE TO ESTIMATE    Basic Metabolic Panel: Recent Labs  Lab 02/17/21 0449 02/18/21 1843 02/18/21 1851 02/19/21 0530 02/20/21 1000 02/20/21 2319  NA 138 136 141 137 139 138  K 3.6 3.6 3.6 3.4* 3.1* 3.4*  CL 110 110 109 110 109 107  CO2 20* 19*  --  18* 24 17*  GLUCOSE 265* 252* 251* 242* 189* 154*  BUN '9 7 7 7 10 9  '$ CREATININE 0.93 0.86 0.70 0.88 0.85 0.93  CALCIUM 9.4 9.6  --  9.0 9.0 9.2  MG  --   --   --   --  1.7 1.9    GFR: Estimated  Creatinine Clearance: 118.8 mL/min (by C-G formula based on SCr of 0.93 mg/dL).  Liver Function Tests: Recent Labs  Lab 02/18/21 1843 02/19/21 0530  AST 16 13*  ALT 12 12  ALKPHOS 92 81  BILITOT 0.7 0.8  PROT 7.2 6.7  ALBUMIN 3.8 3.5    CBG: Recent Labs  Lab 02/20/21 1158 02/20/21 1625 02/20/21  2241 02/21/21 0810 02/21/21 1131  GLUCAP 194* 207* 141* 235* 192*     Recent Results (from the past 240 hour(s))  SARS CORONAVIRUS 2 (TAT 6-24 HRS) Nasopharyngeal Nasopharyngeal Swab     Status: None   Collection Time: 02/19/21  3:20 AM   Specimen: Nasopharyngeal Swab  Result Value Ref Range Status   SARS Coronavirus 2 NEGATIVE NEGATIVE Final    Comment: (NOTE) SARS-CoV-2 target nucleic acids are NOT DETECTED.  The SARS-CoV-2 RNA is generally detectable in upper and lower respiratory specimens during the acute phase of infection. Negative results do not preclude SARS-CoV-2 infection, do not rule out co-infections with other pathogens, and should not be used as the sole basis for treatment or other patient management decisions. Negative results must be combined with clinical observations, patient history, and epidemiological information. The expected result is Negative.  Fact Sheet for Patients: SugarRoll.be  Fact Sheet for Healthcare Providers: https://www.woods-mathews.com/  This test is not yet approved or cleared by the [place] FDA and  has been authorized for detection and/or diagnosis of SARS-CoV-2 by FDA under an Emergency Use Authorization (EUA). This EUA will remain  in effect (meaning this test can be used) for the duration of the COVID-19 declaration under Se ction 564(b)(1) of the Act, 21 U.S.C. section 360bbb-3(b)(1), unless the authorization is terminated or revoked sooner.  Performed at [facility], [place] [address] [place], [place] [address]   Culture, blood (routine x 2)     Status: None  (Preliminary result)   Collection Time: 02/20/21 10:50 PM   Specimen: BLOOD RIGHT HAND  Result Value Ref Range Status   Specimen Description BLOOD RIGHT HAND  Final   Special Requests   Final    BOTTLES DRAWN AEROBIC ONLY Blood Culture adequate volume   Culture   Final    NO GROWTH < 12 HOURS Performed at [facility], [place] [address] [place], [place] [address]    Report Status PENDING  Incomplete  Culture, blood (routine x 2)     Status: None (Preliminary result)   Collection Time: 02/20/21 11:30 PM   Specimen: BLOOD RIGHT HAND  Result Value Ref Range Status   Specimen Description BLOOD RIGHT HAND  Final   Special Requests   Final    BOTTLES DRAWN AEROBIC ONLY Blood Culture results may not be optimal due to an inadequate volume of blood received in culture bottles   Culture   Final    NO GROWTH < 12 HOURS Performed at [facility], [place] [address], [place], [place] [address]    Report Status PENDING  Incomplete         Radiology Studies: DG CHEST PORT 1 VIEW  Result Date: 02/21/2021 CLINICAL DATA:  Shortness of breath EXAM: PORTABLE CHEST 1 VIEW COMPARISON:  June 20th 2022 FINDINGS: The cardiomediastinal silhouette is unchanged and enlarged in contour.Perihilar vascular fullness. No pleural effusion. No pneumothorax. LEFT retrocardiac opacity most consistent with atelectasis. Visualized abdomen is unremarkable. No acute osseous abnormality. IMPRESSION: Cardiomegaly with persistent prominence of the pulmonary vasculature without overt edema. Electronically Signed   By: Valentino Saxon MD   On: 02/21/2021 11:31        Scheduled Meds:  amLODipine  10 mg Oral Daily   ARIPiprazole  5 mg Oral Daily   azaTHIOprine  150 mg Oral BID   carvedilol  37.5 mg Oral BID WC   enoxaparin (LOVENOX) injection  80 mg Subcutaneous Q24H   guaiFENesin  1,200  mg Oral BID   guaiFENesin  1,200 mg Oral Once   hydroxychloroquine  200 mg Oral BID   insulin aspart  0-15 Units  Subcutaneous TID WC   insulin detemir  30 Units Subcutaneous BID   isosorbide-hydrALAZINE  2 tablet Oral TID   pantoprazole (PROTONIX) IV  40 mg Intravenous Q12H   polyethylene glycol  17 g Oral TID   pravastatin  10 mg Oral QPC supper   predniSONE  1 mg Oral BID   pregabalin  75 mg Oral TID   sacubitril-valsartan  1 tablet Oral BID   senna-docusate  1 tablet Oral BID   sodium phosphate  1 enema Rectal Once   spironolactone  50 mg Oral Daily   topiramate  75 mg Oral QHS   Continuous Infusions:  sodium chloride 75 mL/hr at 02/21/21 0934   promethazine (PHENERGAN) injection (IM or IVPB) 6.25 mg (02/20/21 2040)     LOS: 2 days   Time spent: 17mns Greater than 50% of this time was spent in counseling, explanation of diagnosis, planning of further management, and coordination of care.   Voice Recognition /Viviann Sparedictation system was used to create this note, attempts have been made to correct errors. Please contact the author with questions and/or clarifications.   FFlorencia Reasons MD PhD FACP [facility]  Available via Epic secure chat 7am-7pm for nonurgent issues Please page for urgent issues To page the attending provider between 7A-7P or the covering provider during after hours 7P-7A, please log into the web site [email or web address] and access using universal [place] password for that web site. If you do not have the password, please call the hospital operator.    02/21/2021, 3:15 PM

## 2021-02-22 ENCOUNTER — Inpatient Hospital Stay (HOSPITAL_COMMUNITY): Payer: Medicaid Other

## 2021-02-22 DIAGNOSIS — E1159 Type 2 diabetes mellitus with other circulatory complications: Secondary | ICD-10-CM

## 2021-02-22 DIAGNOSIS — I152 Hypertension secondary to endocrine disorders: Secondary | ICD-10-CM

## 2021-02-22 LAB — URINE CULTURE

## 2021-02-22 LAB — BASIC METABOLIC PANEL
Anion gap: 11 (ref 5–15)
BUN: 6 mg/dL (ref 6–20)
CO2: 20 mmol/L — ABNORMAL LOW (ref 22–32)
Calcium: 8.5 mg/dL — ABNORMAL LOW (ref 8.9–10.3)
Chloride: 105 mmol/L (ref 98–111)
Creatinine, Ser: 0.82 mg/dL (ref 0.44–1.00)
GFR, Estimated: >60 mL/min (ref >60)
Glucose, Bld: 163 mg/dL — ABNORMAL HIGH (ref 70–99)
Potassium: 3.7 mmol/L (ref 3.5–5.1)
Sodium: 136 mmol/L (ref 135–145)

## 2021-02-22 LAB — GLUCOSE, CAPILLARY
Glucose-Capillary: 223 mg/dL — ABNORMAL HIGH (ref 70–99)
Glucose-Capillary: 181 mg/dL — ABNORMAL HIGH (ref 70–99)
Glucose-Capillary: 287 mg/dL — ABNORMAL HIGH (ref 70–99)
Glucose-Capillary: 277 mg/dL — ABNORMAL HIGH (ref 70–99)
Glucose-Capillary: 190 mg/dL — ABNORMAL HIGH (ref 70–99)

## 2021-02-22 MED ORDER — TECHNETIUM TC 99M MEBROFENIN IV KIT
5.5000 | PACK | Freq: Once | INTRAVENOUS | Status: AC | PRN
  Administered 2021-02-22: 5.5 via INTRAVENOUS

## 2021-02-22 NOTE — Progress Notes (Signed)
  Speech Language Pathology Treatment: Dysphagia  Patient Details Name: [name] MRN: [id number] DOB: 1970-10-20 Today's Date: 02/22/2021 Time: [id number] SLP Time Calculation (min) (ACUTE ONLY): 16 min  Assessment / Plan / Recommendation Clinical Impression  Pt was seen for dysphagia treatment. She was alert and cooperative during the session, but expressed fear regarding p.o. intake. Pt denied any signs of aspiration with p.o. intake, but continues to report globus sensation. Pt identified the pharynx and cervical esophagus as the sites of the sensation. P.o. intake was limited due to pt's acceptance of trials and fear of emesis. Pt tolerated thin liquids via straw and very small (less than half-tsp boluses) of puree without overt s/sx of aspiration, and oral clearance was adequate. Pt politely refused any additional solids boluses despite encouragement. SLP will continue to follow pt.    HPI HPI: 51 y/o female admitted 02/18/21 with N/V and headache. PMH: Lupus, Sjogren's syndrome, chronic systolic heart failure, HTN, OSA, RCVA with left hemiplegia (2010, 2014), Right Bell's Palsy (2012) with residual weakness, DM, GERD. UGI 12/04/19: Delayed transit of contrast through the gastroesophageal junction  without evidence of stricture or mass suggesting impairment of lower  esophageal sphincter relaxation.      SLP Plan  Continue with current plan of care       Recommendations  Diet recommendations:  (Continue full liquids with GI's advancement as tolerated) Liquids provided via: Cup;Straw Medication Administration: Whole meds with liquid Supervision: Intermittent supervision to cue for compensatory strategies Compensations: Minimize environmental distractions;Slow rate;Small sips/bites                Oral Care Recommendations: Oral care BID Follow up Recommendations: None SLP Visit Diagnosis: Dysphagia, unspecified (R13.10) Plan: Continue with current plan of care        Johanthan Kneeland I. Hardin Negus, [place], [place] Office number [phone] Pager [place] 02/22/2021, 11:56 AM

## 2021-02-22 NOTE — Progress Notes (Signed)
RT has visited with pt 3x about wearing cpap for the night.  First attempt, pt stated to come back at 2200.  At 2200, pt was having apple sauce and said she wasn't ready.  RT asked pt to inform RN when she is ready and RT will return.  At 2300, RN called saying pt was ready for cpap.  Upon entering, pt states to RT she is congested and feels like she can't breathe while using cpap.  Pt states she wears a cpap at home but not consistently.  RT made pt aware that she can request the device be placed at anytime by letting her RN know she has changed her mind.  RT left pt and communicated with RN the request by pt.  RN stated she found pt snoring when she last entered the room.  RT explained what pt had stated and both RT and RN spoke about possible flonase for pt's stuffy nose.  At this time, pt is not wearing cpap but RN said she would contact RT if pt changes her mind or if pt is less congested and feels she can tolerate cpap.  RT will continue to monitor.

## 2021-02-22 NOTE — Evaluation (Signed)
Physical Therapy Evaluation Patient Details Name: [name] MRN: [id number] DOB: [birth year] Today's Date: 02/22/2021   History of Present Illness  Pt is a 50 y.o. F who presents with intractable N/V and severe headache. MRI negative. Plan for EGD. Significant PMH: lupus, Sjogren's syndrome, CHF, HTN, previous stroke with left sided hemiplegia, Bell's Palsy.  Clinical Impression  PTA, pt lives alone, is a limited household ambulator using a cane vs RW and has a daily aide who assists with ADL's. Pt presents with decreased functional mobility secondary to weakness, decreased cardiopulmonary endurance, and balance deficits. Pt ambulating x 15 feet with a walker at a min guard assist level. Presents as a high fall risk based on decreased gait speed and history of falls. Recommend follow up HHPT to address deficits and maximize functional mobility.     Follow Up Recommendations Home health PT;Supervision for mobility/OOB    Equipment Recommendations  None recommended by PT    Recommendations for Other Services       Precautions / Restrictions Precautions Precautions: Fall Restrictions Weight Bearing Restrictions: No      Mobility  Bed Mobility Overal bed mobility: Needs Assistance Bed Mobility: Supine to Sit;Sit to Supine     Supine to sit: Supervision Sit to supine: Supervision   General bed mobility comments: No physical assist. Increased time/effort    Transfers Overall transfer level: Needs assistance Equipment used: Rolling walker (2 wheeled) Transfers: Sit to/from Stand Sit to Stand: Min guard            Ambulation/Gait Ambulation/Gait assistance: Min guard Gait Distance (Feet): 15 Feet Assistive device: Rolling walker (2 wheeled) Gait Pattern/deviations: Step-through pattern;Decreased stride length;Trunk flexed Gait velocity: decreased   General Gait Details: Very slow and effortful, moderate reliance through arms on walker, min guard for safety. Fatigues  easily  Financial trader Rankin (Stroke Patients Only)       Balance Overall balance assessment: Needs assistance Sitting-balance support: Feet supported Sitting balance-Leahy Scale: Good     Standing balance support: Bilateral upper extremity supported Standing balance-Leahy Scale: Poor Standing balance comment: reliant on RW                             Pertinent Vitals/Pain Pain Assessment: Faces Faces Pain Scale: Hurts a little bit Pain Location: back/stomach Pain Descriptors / Indicators: Aching Pain Intervention(s): Monitored during session    Home Living Family/patient expects to be discharged to:: Private residence Living Arrangements: Alone Available Help at Discharge: Family;Personal care attendant Type of Home: House Home Access: Stairs to enter Entrance Stairs-Rails: Right;Left;Can reach both Entrance Stairs-Number of Steps: 6 Home Layout: One level Home Equipment: Environmental consultant - 2 wheels;Walker - 4 wheels;Cane - single point      Prior Function Level of [place]: Needs assistance   Gait / Transfers Assistance Needed: uses cane vs walker, household ambulator  ADL's / Homemaking Assistance Needed: aide assists 7 days/wk, ~3 hours/day. sister brings meals        Hand Dominance        Extremity/Trunk Assessment   Upper Extremity Assessment Upper Extremity Assessment: Defer to OT evaluation    Lower Extremity Assessment Lower Extremity Assessment: LLE deficits/detail;RLE deficits/detail RLE Deficits / Details: Strength 5/5 LLE Deficits / Details: History of residual deficits from stroke. Hip flexion 2/5, knee extension 5/5, ankle dorsiflexion 2/5    Cervical / Trunk  Assessment Cervical / Trunk Assessment: Other exceptions Cervical / Trunk Exceptions: increased body habitus  Communication   Communication: No difficulties  Cognition Arousal/Alertness: Awake/alert Behavior During Therapy: WFL  for tasks assessed/performed Overall Cognitive Status: Within Functional Limits for tasks assessed                                        General Comments      Exercises     Assessment/Plan    PT Assessment Patient needs continued PT services  PT Problem List Decreased strength;Decreased activity tolerance;Decreased balance;Decreased mobility       PT Treatment Interventions DME instruction;Gait training;Stair training;Functional mobility training;Therapeutic activities;Therapeutic exercise;Balance training;Patient/family education    PT Goals (Current goals can be found in the Care Plan section)  Acute Rehab PT Goals Patient Stated Goal: get stronger PT Goal Formulation: With patient Time For Goal Achievement: 03/08/21 Potential to Achieve Goals: Good    Frequency Min 3X/week   Barriers to discharge        Co-evaluation               AM-PAC PT "6 Clicks" Mobility  Outcome Measure Help needed turning from your back to your side while in a flat bed without using bedrails?: None Help needed moving from lying on your back to sitting on the side of a flat bed without using bedrails?: A Little Help needed moving to and from a bed to a chair (including a wheelchair)?: A Little Help needed standing up from a chair using your arms (e.g., wheelchair or bedside chair)?: A Little Help needed to walk in hospital room?: A Little Help needed climbing 3-5 steps with a railing? : A Lot 6 Click Score: 18    End of Session Equipment Utilized During Treatment: Gait belt Activity Tolerance: Patient tolerated treatment well Patient left: in bed;with call bell/phone within reach Nurse Communication: Mobility status PT Visit Diagnosis: Unsteadiness on feet (R26.81);Muscle weakness (generalized) (M62.81);Difficulty in walking, not elsewhere classified (R26.2)    Time: [id number] PT Time Calculation (min) (ACUTE ONLY): 20 min   Charges:   PT Evaluation $PT Eval  Moderate Complexity: 1 Mod          Wyona Almas, PT, DPT Acute Rehabilitation Services Pager [phone] Office [phone]   Deno Etienne 02/22/2021, 5:11 PM

## 2021-02-22 NOTE — Progress Notes (Signed)
[facility] Gastroenterology Progress Note  [name] 50 y.o. 19-Jul-1971  CC:  Nausea/vomiting   Subjective: Patient reports continued nausea.  Has not vomited yet today but reports she can only tolerate clears/jello in small amounts. Had a small BM with stool softeners.  Denies abdominal pain.  ROS : Review of Systems  Cardiovascular:  Negative for chest pain and palpitations.  Gastrointestinal:  Positive for constipation, heartburn, nausea and vomiting. Negative for abdominal pain, blood in stool, diarrhea and melena.     Objective: Vital signs in last 24 hours: Vitals:   02/22/21 0500 02/22/21 1130  BP: 135/65 (!) 147/82  Pulse: 80 97  Resp: 18 20  Temp: 99.5 F (37.5 C) 99.6 F (37.6 C)  SpO2: 95% 96%    Physical Exam:  General:  Alert, cooperative, no distress, obese  Head:  Normocephalic, left-sided facial droop  Eyes:  Anicteric sclera, EOMs intact  Lungs:   Clear to auscultation bilaterally, respirations unlabored  Heart:  Regular rate and rhythm, S1, S2 normal  Abdomen:   Soft, non-tender, non-distended, bowel sounds active all four quadrants    Lab Results: Recent Labs    02/20/21 1000 02/20/21 2319 02/22/21 0412  NA 139 138 136  K 3.1* 3.4* 3.7  CL 109 107 105  CO2 24 17* 20*  GLUCOSE 189* 154* 163*  BUN '10 9 6  '$ CREATININE 0.85 0.93 0.82  CALCIUM 9.0 9.2 8.5*  MG 1.7 1.9  --    No results for input(s): AST, ALT, ALKPHOS, BILITOT, PROT, ALBUMIN in the last 72 hours. Recent Labs    02/20/21 2319  WBC 10.1  HGB 10.9*  HCT 33.6*  MCV 86.8  PLT PLATELET CLUMPS NOTED ON SMEAR, UNABLE TO ESTIMATE   No results for input(s): LABPROT, INR in the last 72 hours.    Assessment: Intractable nausea/vomiting: unclear etiology, possibly due to impairment of lower esophageal sphincter relaxation as seen on upper GI series.  Normal gastric emptying study in 12/2019. -Hgb 10.9 as of 7/23 -Normal LFTs -Normal HIDA scan 7/25   Constipation   Hepatic  adenomas (dx 2014), ruptured hepatic angioma ([id number])   Diastolic CHF   CVA with left-sided hemiplegia   SLE, Sjogren's syndrome    Obesity (BMI 55.98)   Plan: EGD tomorrow with possible dilation.  I thoroughly discussed procedure with the patient to include nature, alternatives, benefits, and risks (including but not limited to bleeding, infection, perforation, tear/rupture of esophagus, anesthesia/cardiac and pulmonary complications).  Patient verbalized understanding and gave verbal consent to proceed with EGD with dilation.  Continue Protonix twice daily.  Continue Miralax TID and Senna BID.  Continue full liquids as tolerated.  NPO after midnight.  [facility] will follow.  Salley Slaughter PA-C 02/22/2021, 12:21 PM  Contact #  [phone]

## 2021-02-22 NOTE — Progress Notes (Signed)
PROGRESS NOTE    [name]  [id number] DOB: 1971-01-20 DOA: 02/18/2021 PCP: Bartholome Bill, MD    No chief complaint on file.   Brief Narrative:  [name] is a 50 y.o. female with history of lupus and Sjogren's syndrome, chronic systolic heart failure with improved EF, hypertension, sleep apnea, previous stroke with left-sided hemiplegia and history of Bell's palsy presented to the ER yesterday with complaints of having intractable nausea vomiting ongoing for the last 2 weeks Assessment & Plan:   Principal Problem:   Nausea & vomiting Active Problems:   SLE   Hypertension complicating diabetes ([place])   Chronic diastolic CHF (congestive heart failure) (HCC)   Worst headache of life   Type 2 diabetes mellitus with hyperlipidemia (HCC)   N&V (nausea and vomiting)   Migraine without aura and without status migrainosus, not intractable   INTRACTABLE nausea and vomiting; CT abd and pelvis shows constipation.  Gi consulted and plan for EGD tomorrow.  Possible impaired lower esophageal sphincter relaxation seen on GI series. Normal GES last year. Normal HIDA. LIVER ENZYMES WNL.  Symptomatic management with anti emetics and pain meds.     Hypokalemia and hypomagnesemia:  Replaced.    Chronic systolic CHF.  She appears euvolemic today.    Headache.  Resolved.    Hypertension:  Suboptimally controlled. Will add hydralazine.    Type 2 dm;  Non insulin dependent.  CBG (last 3)  Recent Labs    02/22/21 0715 02/22/21 1129 02/22/21 1637  GLUCAP 181* 287* 277*   Resume SSI.    Obesity;  Body mass index is 56.15 kg/m.   SLE , SJogren's syndrome.    CVA with left sided hemiparesis.    DVT prophylaxis: lovenox.  (Code Status: full code.  Family Communication: none at bedside.  Disposition:   Status is: Inpatient  Remains inpatient appropriate because:Ongoing diagnostic testing needed not appropriate for outpatient work up  Dispo: The  patient is from: Home              Anticipated d/c is to: Home              Patient currently is not medically stable to d/c.   Difficult to place patient No       Level of care: Telemetry Medical Consultants:  GI   Procedures: none.   Antimicrobials:  Antibiotics Given (last 72 hours)     Date/Time Action Medication Dose   02/21/21 0918 Given   hydroxychloroquine (PLAQUENIL) tablet 200 mg 200 mg   02/21/21 2201 Given   hydroxychloroquine (PLAQUENIL) tablet 200 mg 200 mg   02/22/21 1135 Given   hydroxychloroquine (PLAQUENIL) tablet 200 mg 200 mg         Subjective: Some nausea, and abdominal pain.  No chest pain or sob.   Objective: Vitals:   02/21/21 2154 02/22/21 0500 02/22/21 1130 02/22/21 1639  BP: (!) 143/76 135/65 (!) 147/82 (!) 174/93  Pulse: 84 80 97 84  Resp: '16 18 20 16  '$ Temp: 99.7 F (37.6 C) 99.5 F (37.5 C) 99.6 F (37.6 C) 99.3 F (37.4 C)  TempSrc: Oral Oral Oral Oral  SpO2: (!) 85% 95% 96% 96%  Weight:  (!) 167.5 kg      Intake/Output Summary (Last 24 hours) at 02/22/2021 1756 Last data filed at 02/22/2021 1223 Gross per 24 hour  Intake 240 ml  Output --  Net 240 ml   Filed Weights   02/20/21 0508 02/22/21 0500  Weight: (!) 164.2 kg (!) 167.5 kg    Examination:  General exam: Appears calm and comfortable  Respiratory system: Clear to auscultation. Respiratory effort normal. Cardiovascular system: S1 & S2 heard, RRR. No JVD, No pedal edema. Gastrointestinal system: Abdomen is nondistended, soft and mildly tender.  Normal bowel sounds heard. Central nervous system: Alert and oriented. No focal neurological deficits. Extremities: Symmetric 5 x 5 power. Skin: No rashes, lesions or ulcers Psychiatry:  Mood & affect appropriate.     Data Reviewed: I have personally reviewed following labs and imaging studies  CBC: Recent Labs  Lab 02/17/21 0449 02/18/21 1843 02/18/21 1851 02/19/21 0530 02/20/21 2319  WBC 8.7 9.6  --  10.2  10.1  NEUTROABS  --  6.2  --  6.9  --   HGB 11.6* 12.3 13.3 11.3* 10.9*  HCT 37.5 38.9 39.0 35.5* 33.6*  MCV 90.4 89.2  --  88.8 86.8  PLT 201 230  --  212 PLATELET CLUMPS NOTED ON SMEAR, UNABLE TO ESTIMATE    Basic Metabolic Panel: Recent Labs  Lab 02/18/21 1843 02/18/21 1851 02/19/21 0530 02/20/21 1000 02/20/21 2319 02/22/21 0412  NA 136 141 137 139 138 136  K 3.6 3.6 3.4* 3.1* 3.4* 3.7  CL 110 109 110 109 107 105  CO2 19*  --  18* 24 17* 20*  GLUCOSE 252* 251* 242* 189* 154* 163*  BUN '7 7 7 10 9 6  '$ CREATININE 0.86 0.70 0.88 0.85 0.93 0.82  CALCIUM 9.6  --  9.0 9.0 9.2 8.5*  MG  --   --   --  1.7 1.9  --     GFR: Estimated Creatinine Clearance: 136.4 mL/min (by C-G formula based on SCr of 0.82 mg/dL).  Liver Function Tests: Recent Labs  Lab 02/18/21 1843 02/19/21 0530  AST 16 13*  ALT 12 12  ALKPHOS 92 81  BILITOT 0.7 0.8  PROT 7.2 6.7  ALBUMIN 3.8 3.5    CBG: Recent Labs  Lab 02/21/21 1715 02/21/21 2127 02/22/21 0715 02/22/21 1129 02/22/21 1637  GLUCAP 208* 192* 181* 287* 277*     Recent Results (from the past 240 hour(s))  SARS CORONAVIRUS 2 (TAT 6-24 HRS) Nasopharyngeal Nasopharyngeal Swab     Status: None   Collection Time: 02/19/21  3:20 AM   Specimen: Nasopharyngeal Swab  Result Value Ref Range Status   SARS Coronavirus 2 NEGATIVE NEGATIVE Final    Comment: (NOTE) SARS-CoV-2 target nucleic acids are NOT DETECTED.  The SARS-CoV-2 RNA is generally detectable in upper and lower respiratory specimens during the acute phase of infection. Negative results do not preclude SARS-CoV-2 infection, do not rule out co-infections with other pathogens, and should not be used as the sole basis for treatment or other patient management decisions. Negative results must be combined with clinical observations, patient history, and epidemiological information. The expected result is Negative.  Fact Sheet for  Patients: SugarRoll.be  Fact Sheet for Healthcare Providers: https://www.woods-mathews.com/  This test is not yet approved or cleared by the [place] FDA and  has been authorized for detection and/or diagnosis of SARS-CoV-2 by FDA under an Emergency Use Authorization (EUA). This EUA will remain  in effect (meaning this test can be used) for the duration of the COVID-19 declaration under Se ction 564(b)(1) of the Act, 21 U.S.C. section 360bbb-3(b)(1), unless the authorization is terminated or revoked sooner.  Performed at [facility], [place] [address] [place], [place] [address]   Culture, blood (routine x 2)  Status: Abnormal (Preliminary result)   Collection Time: 02/20/21 10:50 PM   Specimen: BLOOD RIGHT HAND  Result Value Ref Range Status   Specimen Description BLOOD RIGHT HAND  Final   Special Requests   Final    BOTTLES DRAWN AEROBIC ONLY Blood Culture adequate volume   Culture  Setup Time   Final    AEROBIC BOTTLE ONLY GRAM POSITIVE COCCI IN CLUSTERS CRITICAL RESULT CALLED TO, READ BACK BY AND VERIFIED WITH: L SEAY PHARMD 02/21/21 2234 JDW    Culture (A)  Final    STAPHYLOCOCCUS HOMINIS THE SIGNIFICANCE OF ISOLATING THIS ORGANISM FROM A SINGLE SET OF BLOOD CULTURES WHEN MULTIPLE SETS ARE DRAWN IS UNCERTAIN. PLEASE NOTIFY THE MICROBIOLOGY DEPARTMENT WITHIN ONE WEEK IF SPECIATION AND SENSITIVITIES ARE REQUIRED. Performed at [facility], [place] [address] [place], [place] [address]    Report Status PENDING  Incomplete  Blood Culture ID Panel (Reflexed)     Status: Abnormal   Collection Time: 02/20/21 10:50 PM  Result Value Ref Range Status   Enterococcus faecalis NOT DETECTED NOT DETECTED Final   Enterococcus Faecium NOT DETECTED NOT DETECTED Final   Listeria monocytogenes NOT DETECTED NOT DETECTED Final   Staphylococcus species DETECTED (A) NOT DETECTED Final    Comment: CRITICAL RESULT CALLED TO, READ BACK BY AND  VERIFIED WITH: L SEAY PHARMD 02/21/21 2234 JDW    Staphylococcus aureus (BCID) NOT DETECTED NOT DETECTED Final   Staphylococcus epidermidis NOT DETECTED NOT DETECTED Final   Staphylococcus lugdunensis NOT DETECTED NOT DETECTED Final   Streptococcus species NOT DETECTED NOT DETECTED Final   Streptococcus agalactiae NOT DETECTED NOT DETECTED Final   Streptococcus pneumoniae NOT DETECTED NOT DETECTED Final   Streptococcus pyogenes NOT DETECTED NOT DETECTED Final   A.calcoaceticus-baumannii NOT DETECTED NOT DETECTED Final   Bacteroides fragilis NOT DETECTED NOT DETECTED Final   Enterobacterales NOT DETECTED NOT DETECTED Final   Enterobacter cloacae complex NOT DETECTED NOT DETECTED Final   Escherichia coli NOT DETECTED NOT DETECTED Final   Klebsiella aerogenes NOT DETECTED NOT DETECTED Final   Klebsiella oxytoca NOT DETECTED NOT DETECTED Final   Klebsiella pneumoniae NOT DETECTED NOT DETECTED Final   Proteus species NOT DETECTED NOT DETECTED Final   Salmonella species NOT DETECTED NOT DETECTED Final   Serratia marcescens NOT DETECTED NOT DETECTED Final   Haemophilus influenzae NOT DETECTED NOT DETECTED Final   Neisseria meningitidis NOT DETECTED NOT DETECTED Final   Pseudomonas aeruginosa NOT DETECTED NOT DETECTED Final   Stenotrophomonas maltophilia NOT DETECTED NOT DETECTED Final   Candida albicans NOT DETECTED NOT DETECTED Final   Candida auris NOT DETECTED NOT DETECTED Final   Candida glabrata NOT DETECTED NOT DETECTED Final   Candida krusei NOT DETECTED NOT DETECTED Final   Candida parapsilosis NOT DETECTED NOT DETECTED Final   Candida tropicalis NOT DETECTED NOT DETECTED Final   Cryptococcus neoformans/gattii NOT DETECTED NOT DETECTED Final    Comment: Performed at [facility], [address] [place], [place] [address]  Culture, blood (routine x 2)     Status: None (Preliminary result)   Collection Time: 02/20/21 11:30 PM   Specimen: BLOOD RIGHT HAND  Result Value Ref  Range Status   Specimen Description BLOOD RIGHT HAND  Final   Special Requests   Final    BOTTLES DRAWN AEROBIC ONLY Blood Culture results may not be optimal due to an inadequate volume of blood received in culture bottles   Culture  Setup Time   Final  AEROBIC BOTTLE ONLY GRAM POSITIVE COCCI IN CLUSTERS CRITICAL VALUE NOTED.  VALUE IS CONSISTENT WITH PREVIOUSLY REPORTED AND CALLED VALUE.    Culture   Final    NO GROWTH 2 DAYS Performed at [facility], [place] [address] [place], [place] [address]    Report Status PENDING  Incomplete  Urine Culture     Status: Abnormal   Collection Time: 02/21/21  5:21 PM   Specimen: Urine, Clean Catch  Result Value Ref Range Status   Specimen Description URINE, CLEAN CATCH  Final   Special Requests   Final    Immunocompromised Performed at [facility], [place] [address], [place], [place] [address]    Culture MULTIPLE SPECIES PRESENT, SUGGEST RECOLLECTION (A)  Final   Report Status 02/22/2021 FINAL  Final         Radiology Studies: NM Hepato W/EF  Result Date: 02/22/2021 CLINICAL DATA:  Nausea and vomiting. EXAM: NUCLEAR MEDICINE HEPATOBILIARY IMAGING WITH GALLBLADDER EF TECHNIQUE: Sequential images of the abdomen were obtained out to 60 minutes following intravenous administration of radiopharmaceutical. After oral ingestion of Ensure, gallbladder ejection fraction was determined. At 60 min, normal ejection fraction is greater than 33%. RADIOPHARMACEUTICALS:  5.5 mCi Tc-87m Choletec IV COMPARISON:  CT February 18, 2021 FINDINGS: There is prompt, uniform radiotracer uptake by the liver with normal filling of the intrahepatic ducts, common bile duct. Gallbladder activity is visualized, consistent with patency of cystic duct (normal < 60 minutes). Additionally there is normal biliary to bowel transit (normal < 60 minutes), consistent with patent common bile duct. Ensure was administered and the gallbladder appears to empty normally on sequential  images. Calculated gallbladder ejection fraction is 35%. (Normal gallbladder ejection fraction with Ensure is greater than 33%.) No evidence of enterogastric biliary reflux. IMPRESSION: 1.  Patent cystic and common bile ducts. 2.  Normal gallbladder ejection fraction. Electronically Signed   By: JDahlia BailiffMD   On: 02/22/2021 11:56   DG CHEST PORT 1 VIEW  Result Date: 02/21/2021 CLINICAL DATA:  Shortness of breath EXAM: PORTABLE CHEST 1 VIEW COMPARISON:  June 20th 2022 FINDINGS: The cardiomediastinal silhouette is unchanged and enlarged in contour.Perihilar vascular fullness. No pleural effusion. No pneumothorax. LEFT retrocardiac opacity most consistent with atelectasis. Visualized abdomen is unremarkable. No acute osseous abnormality. IMPRESSION: Cardiomegaly with persistent prominence of the pulmonary vasculature without overt edema. Electronically Signed   By: SValentino SaxonMD   On: 02/21/2021 11:31        Scheduled Meds:  amLODipine  10 mg Oral Daily   ARIPiprazole  5 mg Oral Daily   azaTHIOprine  150 mg Oral BID   carvedilol  37.5 mg Oral BID WC   enoxaparin (LOVENOX) injection  80 mg Subcutaneous Q24H   guaiFENesin  1,200 mg Oral BID   hydroxychloroquine  200 mg Oral BID   insulin aspart  0-15 Units Subcutaneous TID WC   insulin detemir  30 Units Subcutaneous BID   isosorbide-hydrALAZINE  2 tablet Oral TID   pantoprazole (PROTONIX) IV  40 mg Intravenous Q12H   polyethylene glycol  17 g Oral TID   pravastatin  10 mg Oral QPC supper   predniSONE  1 mg Oral BID   pregabalin  75 mg Oral TID   sacubitril-valsartan  1 tablet Oral BID   senna-docusate  1 tablet Oral BID   sodium phosphate  1 enema Rectal Once   spironolactone  50 mg Oral Daily   topiramate  75 mg Oral  QHS   Continuous Infusions:  promethazine (PHENERGAN) injection (IM or IVPB) 6.25 mg (02/20/21 2040)     LOS: 3 days        Hosie Poisson, MD [facility]   To contact the attending provider  between 7A-7P or the covering provider during after hours 7P-7A, please log into the web site [email or web address] and access using universal [place] password for that web site. If you do not have the password, please call the hospital operator.  02/22/2021, 5:56 PM

## 2021-02-22 NOTE — H&P (View-Only) (Signed)
[facility] Gastroenterology Progress Note  KHANIA PASTERNACK 50 y.o. 03/08/1971  CC:  Nausea/vomiting   Subjective: Patient reports continued nausea.  Has not vomited yet today but reports she can only tolerate clears/jello in small amounts. Had a small BM with stool softeners.  Denies abdominal pain.  ROS : Review of Systems  Cardiovascular:  Negative for chest pain and palpitations.  Gastrointestinal:  Positive for constipation, heartburn, nausea and vomiting. Negative for abdominal pain, blood in stool, diarrhea and melena.     Objective: Vital signs in last 24 hours: Vitals:   02/22/21 0500 02/22/21 1130  BP: 135/65 (!) 147/82  Pulse: 80 97  Resp: 18 20  Temp: 99.5 F (37.5 C) 99.6 F (37.6 C)  SpO2: 95% 96%    Physical Exam:  General:  Alert, cooperative, no distress, obese  Head:  Normocephalic, left-sided facial droop  Eyes:  Anicteric sclera, EOMs intact  Lungs:   Clear to auscultation bilaterally, respirations unlabored  Heart:  Regular rate and rhythm, S1, S2 normal  Abdomen:   Soft, non-tender, non-distended, bowel sounds active all four quadrants    Lab Results: Recent Labs    02/20/21 1000 02/20/21 2319 02/22/21 0412  NA 139 138 136  K 3.1* 3.4* 3.7  CL 109 107 105  CO2 24 17* 20*  GLUCOSE 189* 154* 163*  BUN '10 9 6  '$ CREATININE 0.85 0.93 0.82  CALCIUM 9.0 9.2 8.5*  MG 1.7 1.9  --    No results for input(s): AST, ALT, ALKPHOS, BILITOT, PROT, ALBUMIN in the last 72 hours. Recent Labs    02/20/21 2319  WBC 10.1  HGB 10.9*  HCT 33.6*  MCV 86.8  PLT PLATELET CLUMPS NOTED ON SMEAR, UNABLE TO ESTIMATE   No results for input(s): LABPROT, INR in the last 72 hours.    Assessment: Intractable nausea/vomiting: unclear etiology, possibly due to impairment of lower esophageal sphincter relaxation as seen on upper GI series.  Normal gastric emptying study in 12/2019. -Hgb 10.9 as of 7/23 -Normal LFTs -Normal HIDA scan 7/25   Constipation   Hepatic  adenomas (dx 2014), ruptured hepatic angioma ([id number])   Diastolic CHF   CVA with left-sided hemiplegia   SLE, Sjogren's syndrome    Obesity (BMI 55.98)   Plan: EGD tomorrow with possible dilation.  I thoroughly discussed procedure with the patient to include nature, alternatives, benefits, and risks (including but not limited to bleeding, infection, perforation, tear/rupture of esophagus, anesthesia/cardiac and pulmonary complications).  Patient verbalized understanding and gave verbal consent to proceed with EGD with dilation.  Continue Protonix twice daily.  Continue Miralax TID and Senna BID.  Continue full liquids as tolerated.  NPO after midnight.  [facility] will follow.  Salley Slaughter PA-C 02/22/2021, 12:21 PM  Contact #  [phone]

## 2021-02-23 ENCOUNTER — Inpatient Hospital Stay (HOSPITAL_COMMUNITY): Payer: Medicaid Other | Admitting: Anesthesiology

## 2021-02-23 ENCOUNTER — Encounter (HOSPITAL_COMMUNITY)
Admission: EM | Disposition: A | Discharge status: Home Health | Payer: Self-pay | Source: Home / Self Care | Attending: Internal Medicine

## 2021-02-23 HISTORY — PX: ESOPHAGOGASTRODUODENOSCOPY: SHX5428

## 2021-02-23 LAB — GLUCOSE, CAPILLARY
Glucose-Capillary: 178 mg/dL — ABNORMAL HIGH (ref 70–99)
Glucose-Capillary: 225 mg/dL — ABNORMAL HIGH (ref 70–99)
Glucose-Capillary: 174 mg/dL — ABNORMAL HIGH (ref 70–99)
Glucose-Capillary: 198 mg/dL — ABNORMAL HIGH (ref 70–99)
Glucose-Capillary: 242 mg/dL — ABNORMAL HIGH (ref 70–99)

## 2021-02-23 LAB — BASIC METABOLIC PANEL
Anion gap: 8 (ref 5–15)
BUN: 6 mg/dL (ref 6–20)
CO2: 22 mmol/L (ref 22–32)
Calcium: 8.4 mg/dL — ABNORMAL LOW (ref 8.9–10.3)
Chloride: 105 mmol/L (ref 98–111)
Creatinine, Ser: 0.88 mg/dL (ref 0.44–1.00)
GFR, Estimated: >60 mL/min (ref >60)
Glucose, Bld: 202 mg/dL — ABNORMAL HIGH (ref 70–99)
Potassium: 3.8 mmol/L (ref 3.5–5.1)
Sodium: 135 mmol/L (ref 135–145)

## 2021-02-23 SURGERY — EGD (ESOPHAGOGASTRODUODENOSCOPY)
Anesthesia: Monitor Anesthesia Care

## 2021-02-23 MED ORDER — PROPOFOL 10 MG/ML IV BOLUS
INTRAVENOUS | Status: DC | PRN
  Administered 2021-02-23: 15 mg via INTRAVENOUS

## 2021-02-23 MED ORDER — LORATADINE 10 MG PO TABS
10.0000 mg | ORAL_TABLET | Freq: Every day | ORAL | Status: DC
  Administered 2021-02-23 – 2021-02-25 (×3): 10 mg via ORAL
  Filled 2021-02-23 (×3): qty 1

## 2021-02-23 MED ORDER — SODIUM CHLORIDE 0.9 % IV SOLN: INTRAVENOUS | Status: DC | PRN

## 2021-02-23 MED ORDER — FLUTICASONE PROPIONATE 50 MCG/ACT NA SUSP
1.0000 | Freq: Every day | NASAL | Status: DC
  Administered 2021-02-23 – 2021-02-25 (×3): 1 via NASAL
  Filled 2021-02-23: qty 16

## 2021-02-23 MED ORDER — LIDOCAINE 2% (20 MG/ML) 5 ML SYRINGE
INTRAMUSCULAR | Status: DC | PRN
  Administered 2021-02-23: 100 mg via INTRAVENOUS

## 2021-02-23 MED ORDER — PROPOFOL 500 MG/50ML IV EMUL
INTRAVENOUS | Status: DC | PRN
  Administered 2021-02-23: 100 ug/kg/min via INTRAVENOUS

## 2021-02-23 NOTE — Progress Notes (Signed)
PROGRESS NOTE    [name]  [id number] DOB: November 01, 1970 DOA: 02/18/2021 PCP: Bartholome Bill, MD    No chief complaint on file.   Brief Narrative:  [name] is a 50 y.o. female with history of lupus and Sjogren's syndrome, chronic systolic heart failure with improved EF, hypertension, sleep apnea, previous stroke with left-sided hemiplegia and history of Bell's palsy presented to the ER yesterday with complaints of having intractable nausea vomiting ongoing for the last 2 weeks Assessment & Plan:   Principal Problem:   Nausea & vomiting Active Problems:   SLE   Hypertension complicating diabetes ([place])   Chronic diastolic CHF (congestive heart failure) (HCC)   Worst headache of life   Type 2 diabetes mellitus with hyperlipidemia (HCC)   N&V (nausea and vomiting)   Migraine without aura and without status migrainosus, not intractable   INTRACTABLE nausea and vomiting; CT abd and pelvis shows constipation.  Gi consulted , she underwent EGD showing Segmental minimal inflammation characterized by congestion (edema) and erythema was found in the gastric antrum.Marland Kitchen  Possible impaired lower esophageal sphincter relaxation seen on GI series. Normal GES last year. Normal HIDA scan.  Liver panel wnl.  Symptomatic management with anti emetics and PPI.  Advance diet as tolerated.     Hypokalemia and hypomagnesemia:  Replaced.    Chronic systolic CHF.  She appears euvolemic today.    Headache.  Resolved.    Hypertension:  Well controlled.  Resume norvasc, bidil and coreg.    Type 2 dm;  Non insulin dependent.  CBG (last 3)  Recent Labs    02/22/21 2156 02/23/21 0702 02/23/21 1228  GLUCAP 190* 178* 225*    Resume SSI. Last A1c is 13.4. repeat A!c today.    Morbid Obesity;  Body mass index is 56.15 kg/m.   SLE , SJogren's syndrome.  Resume imuran, prednisone.    CVA with left sided hemiparesis.  Resume statin.    DVT prophylaxis: lovenox.   (Code Status: full code.  Family Communication: none at bedside.  Disposition:   Status is: Inpatient  Remains inpatient appropriate because:Ongoing diagnostic testing needed not appropriate for outpatient work up  Dispo: The patient is from: Home              Anticipated d/c is to: Home              Patient currently is not medically stable to d/c.   Difficult to place patient No       Level of care: Telemetry Medical Consultants:  GI   Procedures: none.   Antimicrobials:  Antibiotics Given (last 72 hours)     Date/Time Action Medication Dose   02/21/21 0918 Given   hydroxychloroquine (PLAQUENIL) tablet 200 mg 200 mg   02/21/21 2201 Given   hydroxychloroquine (PLAQUENIL) tablet 200 mg 200 mg   02/22/21 1135 Given   hydroxychloroquine (PLAQUENIL) tablet 200 mg 200 mg   02/22/21 2301 Given   hydroxychloroquine (PLAQUENIL) tablet 200 mg 200 mg   02/23/21 1028 Given   hydroxychloroquine (PLAQUENIL) tablet 200 mg 200 mg         Subjective: No vomiting today.   Objective: Vitals:   02/23/21 0923 02/23/21 0932 02/23/21 0942 02/23/21 1030  BP: 108/61 119/66 127/67 125/62  Pulse: 78 81 80 80  Resp: 20 (!) 22 (!) 23 20  Temp:    97.7 F (36.5 C)  TempSrc:    Oral  SpO2: 97% 95% 99% 99%  Weight:  Intake/Output Summary (Last 24 hours) at 02/23/2021 1625 Last data filed at 02/23/2021 0916 Gross per 24 hour  Intake 1070 ml  Output --  Net 1070 ml    Filed Weights   02/20/21 0508 02/22/21 0500  Weight: (!) 164.2 kg (!) 167.5 kg    Examination:  General exam: [employer] Morbidly obese not in any kind of distress Respiratory system: Air entry fair bilateral, no wheezing or rhonchi Cardiovascular system: S1-S2 heard, regular rate rhythm, no JVD Gastrointestinal system: Abdomen is soft nontender bowel sounds normal Central nervous system: Alert and oriented to place and person, answering questions appropriately Extremities: No cyanosis Skin: No  rashes seen Psychiatry: Mood is    Data Reviewed: I have personally reviewed following labs and imaging studies  CBC: Recent Labs  Lab 02/17/21 0449 02/18/21 1843 02/18/21 1851 02/19/21 0530 02/20/21 2319  WBC 8.7 9.6  --  10.2 10.1  NEUTROABS  --  6.2  --  6.9  --   HGB 11.6* 12.3 13.3 11.3* 10.9*  HCT 37.5 38.9 39.0 35.5* 33.6*  MCV 90.4 89.2  --  88.8 86.8  PLT 201 230  --  212 PLATELET CLUMPS NOTED ON SMEAR, UNABLE TO ESTIMATE     Basic Metabolic Panel: Recent Labs  Lab 02/19/21 0530 02/20/21 1000 02/20/21 2319 02/22/21 0412 02/23/21 0332  NA 137 139 138 136 135  K 3.4* 3.1* 3.4* 3.7 3.8  CL 110 109 107 105 105  CO2 18* 24 17* 20* 22  GLUCOSE 242* 189* 154* 163* 202*  BUN '7 10 9 6 6  '$ CREATININE 0.88 0.85 0.93 0.82 0.88  CALCIUM 9.0 9.0 9.2 8.5* 8.4*  MG  --  1.7 1.9  --   --      GFR: Estimated Creatinine Clearance: 127.1 mL/min (by C-G formula based on SCr of 0.88 mg/dL).  Liver Function Tests: Recent Labs  Lab 02/18/21 1843 02/19/21 0530  AST 16 13*  ALT 12 12  ALKPHOS 92 81  BILITOT 0.7 0.8  PROT 7.2 6.7  ALBUMIN 3.8 3.5     CBG: Recent Labs  Lab 02/22/21 1129 02/22/21 1637 02/22/21 2156 02/23/21 0702 02/23/21 1228  GLUCAP 287* 277* 190* 178* 225*      Recent Results (from the past 240 hour(s))  SARS CORONAVIRUS 2 (TAT 6-24 HRS) Nasopharyngeal Nasopharyngeal Swab     Status: None   Collection Time: 02/19/21  3:20 AM   Specimen: Nasopharyngeal Swab  Result Value Ref Range Status   SARS Coronavirus 2 NEGATIVE NEGATIVE Final    Comment: (NOTE) SARS-CoV-2 target nucleic acids are NOT DETECTED.  The SARS-CoV-2 RNA is generally detectable in upper and lower respiratory specimens during the acute phase of infection. Negative results do not preclude SARS-CoV-2 infection, do not rule out co-infections with other pathogens, and should not be used as the sole basis for treatment or other patient management decisions. Negative results  must be combined with clinical observations, patient history, and epidemiological information. The expected result is Negative.  Fact Sheet for Patients: SugarRoll.be  Fact Sheet for Healthcare Providers: https://www.woods-mathews.com/  This test is not yet approved or cleared by the [place] FDA and  has been authorized for detection and/or diagnosis of SARS-CoV-2 by FDA under an Emergency Use Authorization (EUA). This EUA will remain  in effect (meaning this test can be used) for the duration of the COVID-19 declaration under Se ction 564(b)(1) of the Act, 21 U.S.C. section 360bbb-3(b)(1), unless the authorization is terminated or revoked sooner.  Performed at [facility], [place] [address], [place], [place] [address]   Culture, blood (routine x 2)     Status: Abnormal (Preliminary result)   Collection Time: 02/20/21 10:50 PM   Specimen: BLOOD RIGHT HAND  Result Value Ref Range Status   Specimen Description BLOOD RIGHT HAND  Final   Special Requests   Final    BOTTLES DRAWN AEROBIC ONLY Blood Culture adequate volume   Culture  Setup Time   Final    AEROBIC BOTTLE ONLY GRAM POSITIVE COCCI IN CLUSTERS CRITICAL RESULT CALLED TO, READ BACK BY AND VERIFIED WITH: L SEAY PHARMD 02/21/21 2234 JDW    Culture (A)  Final    STAPHYLOCOCCUS HOMINIS THE SIGNIFICANCE OF ISOLATING THIS ORGANISM FROM A SINGLE SET OF BLOOD CULTURES WHEN MULTIPLE SETS ARE DRAWN IS UNCERTAIN. PLEASE NOTIFY THE MICROBIOLOGY DEPARTMENT WITHIN ONE WEEK IF SPECIATION AND SENSITIVITIES ARE REQUIRED. Performed at [facility], [place] [address], [place], [place] [address]    Report Status PENDING  Incomplete  Blood Culture ID Panel (Reflexed)     Status: Abnormal   Collection Time: 02/20/21 10:50 PM  Result Value Ref Range Status   Enterococcus faecalis NOT DETECTED NOT DETECTED Final   Enterococcus Faecium NOT DETECTED NOT DETECTED Final   Listeria monocytogenes  NOT DETECTED NOT DETECTED Final   Staphylococcus species DETECTED (A) NOT DETECTED Final    Comment: CRITICAL RESULT CALLED TO, READ BACK BY AND VERIFIED WITH: L SEAY PHARMD 02/21/21 2234 JDW    Staphylococcus aureus (BCID) NOT DETECTED NOT DETECTED Final   Staphylococcus epidermidis NOT DETECTED NOT DETECTED Final   Staphylococcus lugdunensis NOT DETECTED NOT DETECTED Final   Streptococcus species NOT DETECTED NOT DETECTED Final   Streptococcus agalactiae NOT DETECTED NOT DETECTED Final   Streptococcus pneumoniae NOT DETECTED NOT DETECTED Final   Streptococcus pyogenes NOT DETECTED NOT DETECTED Final   A.calcoaceticus-baumannii NOT DETECTED NOT DETECTED Final   Bacteroides fragilis NOT DETECTED NOT DETECTED Final   Enterobacterales NOT DETECTED NOT DETECTED Final   Enterobacter cloacae complex NOT DETECTED NOT DETECTED Final   Escherichia coli NOT DETECTED NOT DETECTED Final   Klebsiella aerogenes NOT DETECTED NOT DETECTED Final   Klebsiella oxytoca NOT DETECTED NOT DETECTED Final   Klebsiella pneumoniae NOT DETECTED NOT DETECTED Final   Proteus species NOT DETECTED NOT DETECTED Final   Salmonella species NOT DETECTED NOT DETECTED Final   Serratia marcescens NOT DETECTED NOT DETECTED Final   Haemophilus influenzae NOT DETECTED NOT DETECTED Final   Neisseria meningitidis NOT DETECTED NOT DETECTED Final   Pseudomonas aeruginosa NOT DETECTED NOT DETECTED Final   Stenotrophomonas maltophilia NOT DETECTED NOT DETECTED Final   Candida albicans NOT DETECTED NOT DETECTED Final   Candida auris NOT DETECTED NOT DETECTED Final   Candida glabrata NOT DETECTED NOT DETECTED Final   Candida krusei NOT DETECTED NOT DETECTED Final   Candida parapsilosis NOT DETECTED NOT DETECTED Final   Candida tropicalis NOT DETECTED NOT DETECTED Final   Cryptococcus neoformans/gattii NOT DETECTED NOT DETECTED Final    Comment: Performed at [facility], [address], [place], [place] [address]  Culture,  blood (routine x 2)     Status: Abnormal (Preliminary result)   Collection Time: 02/20/21 11:30 PM   Specimen: BLOOD RIGHT HAND  Result Value Ref Range Status   Specimen Description BLOOD RIGHT HAND  Final   Special Requests   Final    BOTTLES DRAWN AEROBIC ONLY Blood Culture results may not  be optimal due to an inadequate volume of blood received in culture bottles   Culture  Setup Time   Final    AEROBIC BOTTLE ONLY GRAM POSITIVE COCCI IN CLUSTERS CRITICAL VALUE NOTED.  VALUE IS CONSISTENT WITH PREVIOUSLY REPORTED AND CALLED VALUE.    Culture (A)  Final    STAPHYLOCOCCUS CAPITIS THE SIGNIFICANCE OF ISOLATING THIS ORGANISM FROM A SINGLE SET OF BLOOD CULTURES WHEN MULTIPLE SETS ARE DRAWN IS UNCERTAIN. PLEASE NOTIFY THE MICROBIOLOGY DEPARTMENT WITHIN ONE WEEK IF SPECIATION AND SENSITIVITIES ARE REQUIRED. Performed at [facility], [place] [address] [place], [place] [address]    Report Status PENDING  Incomplete  Urine Culture     Status: Abnormal   Collection Time: 02/21/21  5:21 PM   Specimen: Urine, Clean Catch  Result Value Ref Range Status   Specimen Description URINE, CLEAN CATCH  Final   Special Requests   Final    Immunocompromised Performed at [facility], [place] [address] [place], [place] [address]    Culture MULTIPLE SPECIES PRESENT, SUGGEST RECOLLECTION (A)  Final   Report Status 02/22/2021 FINAL  Final          Radiology Studies: NM Hepato W/EF  Result Date: 02/22/2021 CLINICAL DATA:  Nausea and vomiting. EXAM: NUCLEAR MEDICINE HEPATOBILIARY IMAGING WITH GALLBLADDER EF TECHNIQUE: Sequential images of the abdomen were obtained out to 60 minutes following intravenous administration of radiopharmaceutical. After oral ingestion of Ensure, gallbladder ejection fraction was determined. At 60 min, normal ejection fraction is greater than 33%. RADIOPHARMACEUTICALS:  5.5 mCi Tc-79m Choletec IV COMPARISON:  CT February 18, 2021 FINDINGS: There is prompt, uniform  radiotracer uptake by the liver with normal filling of the intrahepatic ducts, common bile duct. Gallbladder activity is visualized, consistent with patency of cystic duct (normal < 60 minutes). Additionally there is normal biliary to bowel transit (normal < 60 minutes), consistent with patent common bile duct. Ensure was administered and the gallbladder appears to empty normally on sequential images. Calculated gallbladder ejection fraction is 35%. (Normal gallbladder ejection fraction with Ensure is greater than 33%.) No evidence of enterogastric biliary reflux. IMPRESSION: 1.  Patent cystic and common bile ducts. 2.  Normal gallbladder ejection fraction. Electronically Signed   By: JDahlia BailiffMD   On: 02/22/2021 11:56        Scheduled Meds:  amLODipine  10 mg Oral Daily   ARIPiprazole  5 mg Oral Daily   azaTHIOprine  150 mg Oral BID   carvedilol  37.5 mg Oral BID WC   enoxaparin (LOVENOX) injection  80 mg Subcutaneous Q24H   guaiFENesin  1,200 mg Oral BID   hydroxychloroquine  200 mg Oral BID   insulin aspart  0-15 Units Subcutaneous TID WC   insulin detemir  30 Units Subcutaneous BID   isosorbide-hydrALAZINE  2 tablet Oral TID   pantoprazole (PROTONIX) IV  40 mg Intravenous Q12H   polyethylene glycol  17 g Oral TID   pravastatin  10 mg Oral QPC supper   predniSONE  1 mg Oral BID   pregabalin  75 mg Oral TID   sacubitril-valsartan  1 tablet Oral BID   senna-docusate  1 tablet Oral BID   sodium phosphate  1 enema Rectal Once   spironolactone  50 mg Oral Daily   topiramate  75 mg Oral QHS   Continuous Infusions:  promethazine (PHENERGAN) injection (IM or IVPB) 6.25 mg (02/22/21 2259)     LOS: 4 days  Hosie Poisson, MD [facility]   To contact the attending provider between 7A-7P or the covering provider during after hours 7P-7A, please log into the web site [email or web address] and access using universal [place] password for that web site. If you do not have  the password, please call the hospital operator.  02/23/2021, 4:25 PM

## 2021-02-23 NOTE — Anesthesia Preprocedure Evaluation (Addendum)
Anesthesia Evaluation  Patient identified by MRN, date of birth, ID band Patient awake    Reviewed: Allergy & Precautions, NPO status , Patient's Chart, lab work & pertinent test results  Airway Mallampati: III  TM Distance: >3 FB Neck ROM: Full    Dental  (+) Teeth Intact   Pulmonary sleep apnea, Continuous Positive Airway Pressure Ventilation and Oxygen sleep apnea , COPD,  COPD inhaler, former smoker,     + decreased breath sounds      Cardiovascular hypertension, Pt. on medications and Pt. on home beta blockers +CHF   Rhythm:Regular Rate:Normal     Neuro/Psych  Headaches, Anxiety History of Bells CVA (2010, 2014, left sided weakness), Residual Symptoms    GI/Hepatic Neg liver ROS, GERD  Medicated,Nausea, vomiting   Endo/Other  diabetes, Poorly Controlled, Type 2, Insulin Dependent, Oral Hypoglycemic AgentsMorbid obesitySLE  Renal/GU Renal disease  negative genitourinary   Musculoskeletal negative musculoskeletal ROS (+)   Abdominal (+) + obese,   Peds  Hematology  (+) anemia ,   Anesthesia Other Findings   Reproductive/Obstetrics                            Anesthesia Physical Anesthesia Plan  ASA: 3  Anesthesia Plan: MAC   Post-op Pain Management:    Induction: Intravenous  PONV Risk Score and Plan: 2 and Propofol infusion and Treatment may vary due to age or medical condition  Airway Management Planned: Simple Face Mask, Natural Airway and Nasal Cannula  Additional Equipment: None  Intra-op Plan:   Post-operative Plan:   Informed Consent: I have reviewed the patients History and Physical, chart, labs and discussed the procedure including the risks, benefits and alternatives for the proposed anesthesia with the patient or authorized representative who has indicated his/her understanding and acceptance.     Dental advisory given  Plan Discussed with: CRNA  Anesthesia  Plan Comments: (Lab Results      Component                Value               Date                      WBC                      10.1                02/20/2021                HGB                      10.9 (L)            02/20/2021                HCT                      33.6 (L)            02/20/2021                MCV                      86.8                02/20/2021  PLT                                          02/20/2021            PLATELET CLUMPS NOTED ON SMEAR, UNABLE TO ESTIMATE Lab Results      Component                Value               Date                      NA                       135                 02/23/2021                K                        3.8                 02/23/2021                CO2                      22                  02/23/2021                GLUCOSE                  202 (H)             02/23/2021                BUN                      6                   02/23/2021                CREATININE               0.88                02/23/2021                CALCIUM                  8.4 (L)             02/23/2021                GFRNONAA                 >60                 02/23/2021                GFRAA                    >60                 02/07/2020           ECHO 07/21: IMPRESSIONS  1. Left ventricular ejection fraction, by estimation,  is 40 to 45%. The  left ventricle has mildly decreased function. The left ventricle  demonstrates global hypokinesis. Left ventricular diastolic function could  not be evaluated.  2. Right ventricular systolic function is normal. The right ventricular  size is mildly enlarged. Tricuspid regurgitation signal is inadequate for  assessing PA pressure.  3. The mitral valve is normal in structure. Trivial mitral valve  regurgitation.  4. The aortic valve is normal in structure. Aortic valve regurgitation is  not visualized. No aortic stenosis is present.   Conclusion(s)/Recommendation(s): No evidence of  valvular vegetations on  this transthoracic echocardiogram. Would recommend a transesophageal  echocardiogram to exclude infective endocarditis if clinically indicated. )       Anesthesia Quick Evaluation

## 2021-02-23 NOTE — Transfer of Care (Signed)
Immediate Anesthesia Transfer of Care Note  Patient: [name]  Procedure(s) Performed: ESOPHAGOGASTRODUODENOSCOPY (EGD)  Patient Location: Endoscopy Unit  Anesthesia Type:MAC  Level of Consciousness: awake, alert  and oriented  Airway & Oxygen Therapy: Patient Spontanous Breathing and Patient connected to face mask oxygen  Post-op Assessment: Report given to RN and Post -op Vital signs reviewed and stable  Post vital signs: Reviewed and stable  Last Vitals:  Vitals Value Taken Time  BP 108/61 02/23/21 0924  Temp    Pulse 80 02/23/21 0926  Resp 24 02/23/21 0926  SpO2 94 % 02/23/21 0926  Vitals shown include unvalidated device data.  Last Pain:  Vitals:   02/23/21 0830  TempSrc: Temporal  PainSc: 0-No pain      Patients Stated Pain Goal: 0 (81/77/11 6579)  Complications: No notable events documented.

## 2021-02-23 NOTE — Interval H&P Note (Signed)
History and Physical Interval Note:  02/23/2021 8:58 AM  [name]  has presented today for surgery, with the diagnosis of nausea/vomiting, abnormal upper GI series.  The various methods of treatment have been discussed with the patient and family. After consideration of risks, benefits and other options for treatment, the patient has consented to  Procedure(s) with comments: ESOPHAGOGASTRODUODENOSCOPY (EGD) (N/A) - possible dilation as a surgical intervention.  The patient's history has been reviewed, patient examined, no change in status, stable for surgery.  I have reviewed the patient's chart and labs.  Questions were answered to the patient's satisfaction.     Lear Ng

## 2021-02-23 NOTE — Anesthesia Procedure Notes (Signed)
Procedure Name: MAC Date/Time: 02/23/2021 9:01 AM Performed by: Mariea Clonts, CRNA Pre-anesthesia Checklist: Emergency Drugs available, Suction available, Patient being monitored, Timeout performed and Patient identified Patient Re-evaluated:Patient Re-evaluated prior to induction Oxygen Delivery Method: Simple face mask

## 2021-02-23 NOTE — Progress Notes (Signed)
OT Cancellation Note  Patient Details Name: [name] MRN: [id number] DOB: Sep 26, 1970   Cancelled Treatment:    Reason Eval/Treat Not Completed: Patient at procedure or test/ unavailable. Pt off unit for a procedure. OT will follow up next available time as appropriate  Britt Bottom 02/23/2021, 9:14 AM

## 2021-02-23 NOTE — Progress Notes (Signed)
Pt refusing CPAP for tonight due to congestion. Pt states she will have RN notify RT if she feels like she can use it later. RT will continue to monitor.

## 2021-02-23 NOTE — Anesthesia Postprocedure Evaluation (Signed)
Anesthesia Post Note  Patient: [name]  Procedure(s) Performed: ESOPHAGOGASTRODUODENOSCOPY (EGD)     Patient location during evaluation: PACU Anesthesia Type: MAC Level of consciousness: awake and alert Pain management: pain level controlled Vital Signs Assessment: post-procedure vital signs reviewed and stable Respiratory status: spontaneous breathing, nonlabored ventilation and respiratory function stable Cardiovascular status: blood pressure returned to baseline and stable Postop Assessment: no apparent nausea or vomiting Anesthetic complications: no   No notable events documented.  Last Vitals:  Vitals:   02/23/21 0923 02/23/21 0932  BP: 108/61 119/66  Pulse: 78 81  Resp: 20 (!) 22  Temp:    SpO2: 97% 95%    Last Pain:  Vitals:   02/23/21 0830  TempSrc: Temporal  PainSc: 0-No pain                 Pervis Hocking

## 2021-02-23 NOTE — Op Note (Signed)
[facility] Patient Name: [name] Procedure Date : 02/23/2021 MRN: [id number] Attending MD: Lear Ng , MD Date of Birth: 1970/12/14 CSN: [id number] Age: 50 Admit Type: Inpatient Procedure:                Upper GI endoscopy Indications:              Nausea with vomiting Providers:                Lear Ng, MD, Glori Bickers, RN, Tyrone Apple, Technician, Virgilio Belling Huel Cote, CRNA Referring MD:             hospital team Medicines:                Propofol per Anesthesia, Monitored Anesthesia Care Complications:            No immediate complications. Estimated Blood Loss:     Estimated blood loss: none. Procedure:                Pre-Anesthesia Assessment:                           - Prior to the procedure, a History and Physical                            was performed, and patient medications and                            allergies were reviewed. The patient's tolerance of                            previous anesthesia was also reviewed. The risks                            and benefits of the procedure and the sedation                            options and risks were discussed with the patient.                            All questions were answered, and informed consent                            was obtained. Prior Anticoagulants: The patient has                            taken no previous anticoagulant or antiplatelet                            agents. ASA Grade Assessment: III - A patient with                            severe systemic disease. After reviewing the risks  and benefits, the patient was deemed in                            satisfactory condition to undergo the procedure.                           After obtaining informed consent, the endoscope was                            passed under direct vision. Throughout the                            procedure, the patient's blood  pressure, pulse, and                            oxygen saturations were monitored continuously. The                            [id number] Olympus endoscope was introduced                            through the mouth, and advanced to the second part                            of duodenum. The upper GI endoscopy was                            accomplished without difficulty. The patient                            tolerated the procedure well. Scope In: Scope Out: Findings:      The examined esophagus was normal.      The Z-line was regular and was found 44 cm from the incisors.      Segmental minimal inflammation characterized by congestion (edema) and       erythema was found in the gastric antrum.      The exam of the stomach was otherwise normal.      The cardia and gastric fundus were normal on retroflexion.      The examined duodenum was normal. Impression:               - Normal esophagus.                           - Z-line regular, 44 cm from the incisors.                           - Acute gastritis.                           - Normal examined duodenum.                           - No specimens collected. Recommendation:           - Advance diet as tolerated and clear liquid diet.                           -  Observe patient's clinical course. Procedure Code(s):        --- Professional ---                           [phone], Esophagogastroduodenoscopy, flexible,                            transoral; diagnostic, including collection of                            specimen(s) by brushing or washing, when performed                            (separate procedure) Diagnosis Code(s):        --- Professional ---                           R11.2, Nausea with vomiting, unspecified                           K29.00, Acute gastritis without bleeding CPT copyright 2019 [employer]. All rights reserved. The codes documented in this report are preliminary and upon coder  review may  be revised to meet current compliance requirements. Lear Ng, MD 02/23/2021 9:30:58 AM This report has been signed electronically. Number of Addenda: 0

## 2021-02-24 LAB — CBC
HCT: 33.7 % — ABNORMAL LOW (ref 36.0–46.0)
Hemoglobin: 10.8 g/dL — ABNORMAL LOW (ref 12.0–15.0)
MCH: 28.3 pg (ref 26.0–34.0)
MCHC: 32 g/dL (ref 30.0–36.0)
MCV: 88.2 fL (ref 80.0–100.0)
Platelets: 195 10*3/uL (ref 150–400)
RBC: 3.82 MIL/uL — ABNORMAL LOW (ref 3.87–5.11)
RDW: 13.4 % (ref 11.5–15.5)
WBC: 6.3 10*3/uL (ref 4.0–10.5)
nRBC: 0 % (ref 0.0–0.2)

## 2021-02-24 LAB — BASIC METABOLIC PANEL
Anion gap: 8 (ref 5–15)
BUN: 5 mg/dL — ABNORMAL LOW (ref 6–20)
CO2: 22 mmol/L (ref 22–32)
Calcium: 8.6 mg/dL — ABNORMAL LOW (ref 8.9–10.3)
Chloride: 106 mmol/L (ref 98–111)
Creatinine, Ser: 1.07 mg/dL — ABNORMAL HIGH (ref 0.44–1.00)
GFR, Estimated: >60 mL/min (ref >60)
Glucose, Bld: 251 mg/dL — ABNORMAL HIGH (ref 70–99)
Potassium: 3.2 mmol/L — ABNORMAL LOW (ref 3.5–5.1)
Sodium: 136 mmol/L (ref 135–145)

## 2021-02-24 LAB — GLUCOSE, CAPILLARY
Glucose-Capillary: 250 mg/dL — ABNORMAL HIGH (ref 70–99)
Glucose-Capillary: 266 mg/dL — ABNORMAL HIGH (ref 70–99)
Glucose-Capillary: 321 mg/dL — ABNORMAL HIGH (ref 70–99)
Glucose-Capillary: 305 mg/dL — ABNORMAL HIGH (ref 70–99)

## 2021-02-24 LAB — CULTURE, BLOOD (ROUTINE X 2): Special Requests: ADEQUATE

## 2021-02-24 LAB — HEMOGLOBIN A1C
Hgb A1c MFr Bld: 9.9 % — ABNORMAL HIGH (ref 4.8–5.6)
Mean Plasma Glucose: 237 mg/dL

## 2021-02-24 MED ORDER — POTASSIUM CHLORIDE CRYS ER 20 MEQ PO TBCR
40.0000 meq | EXTENDED_RELEASE_TABLET | Freq: Once | ORAL | Status: AC
  Administered 2021-02-24: 40 meq via ORAL
  Filled 2021-02-24: qty 2

## 2021-02-24 MED ORDER — PANTOPRAZOLE SODIUM 40 MG PO TBEC
40.0000 mg | DELAYED_RELEASE_TABLET | Freq: Two times a day (BID) | ORAL | Status: DC
  Administered 2021-02-24 – 2021-02-25 (×2): 40 mg via ORAL
  Filled 2021-02-24: qty 1

## 2021-02-24 MED ORDER — INSULIN ASPART 100 UNIT/ML IJ SOLN
3.0000 [IU] | Freq: Three times a day (TID) | INTRAMUSCULAR | Status: DC
  Administered 2021-02-24 – 2021-02-25 (×3): 3 [IU] via SUBCUTANEOUS

## 2021-02-24 NOTE — Progress Notes (Signed)
Pt refused cpap for tonight 

## 2021-02-24 NOTE — Progress Notes (Signed)
  Speech Language Pathology Treatment: Dysphagia  Patient Details Name: [name] MRN: [id number] DOB: 05-11-1971 Today's Date: 02/24/2021 Time: [id number] SLP Time Calculation (min) (ACUTE ONLY): 22 min  Assessment / Plan / Recommendation Clinical Impression  Pt was seen for dysphagia treatment. She was alert and cooperative during the session. Pt's diet has been advanced to regular texture solids and thin liquids by GI. Pt reported that n/v  has improved, but is still not back to baseline. She tolerated thin liquids via straw, meds whole with thin liquids, and regular texture solids without overt s/sx of aspiration. Mastication of regular textures was [facility], but pt reported sensation of tightening and fatigue during mastication. Pt expressed that she has been coughing and "almost choking" with solids and has to "quickly take something to drink to get it down" when this occurs. Pt expressed that she periodically feels her hand "tighten/stiffen" due to Lupus and that she has this sensation pharyngeally as well. A modified barium swallow study would be beneficial, but pt's weight exceeds the weight limit of the [place] chair needed for the study and pt is currently unable to stand during the study. Pt has verbalized agreement with completion of a fiberoptic endoscopic evaluation of swallowing (FEES) to further assess pharyngeal function; this will be planned for today at 1400. Her current diet will be continued until this is completed.    HPI HPI: 50 y/o female admitted 02/18/21 with N/V and headache. EGD 7/26: No obstructive process. PMH: Lupus, Sjogren's syndrome, chronic systolic heart failure, HTN, OSA, RCVA with left hemiplegia (2010, 2014), Right Bell's Palsy (2012) with residual weakness, DM, GERD. UGI 12/04/19: Delayed transit of contrast through the gastroesophageal junction  without evidence of stricture or mass suggesting impairment of lower  esophageal sphincter relaxation.      SLP Plan   Continue with current plan of care       Recommendations  Diet recommendations: Regular;Thin liquid Liquids provided via: Cup;Straw Medication Administration: Whole meds with liquid Supervision: Intermittent supervision to cue for compensatory strategies Compensations: Minimize environmental distractions;Slow rate;Small sips/bites                Oral Care Recommendations: Oral care BID Follow up Recommendations: None SLP Visit Diagnosis: Dysphagia, unspecified (R13.10) Plan: Continue with current plan of care       Misako Roeder I. Hardin Negus, [place], [place] Office number [phone] Pager [place] 02/24/2021, 12:14 PM

## 2021-02-24 NOTE — Procedures (Signed)
Objective Swallowing Evaluation: Type of Study: FEES-Fiberoptic Endoscopic Evaluation of Swallow   Patient Details  Name: [name] MRN: [id number] Date of Birth: 1971-07-08  Today's Date: 02/24/2021 Time: SLP Start Time (ACUTE ONLY): 1440 -SLP Stop Time (ACUTE ONLY): 1500  SLP Time Calculation (min) (ACUTE ONLY): 20 min   Past Medical History:  Past Medical History:  Diagnosis Date   Acute renal failure (HCC)     Intractable nausea vomiting secondary to diabetic gastroparesis causing dehydration and acute renal failure /notes 04/01/2013   Anginal pain ([place])    Anxiety    Bell's palsy 08/09/2010   Bicornuate uterus    CAP (community acquired pneumonia) 10/2011   Archie Endo 11/08/2011   Chest pain 01/13/2014   CHF (congestive heart failure) ([place])    Diabetic gastroparesis associated with type 2 diabetes mellitus ([place])    this is presumed diagnoses, not confirmed by any studies.    Eczema    Family history of malignant neoplasm of breast    Family history of malignant neoplasm of ovary    GERD (gastroesophageal reflux disease)    High cholesterol    Hypertension    Liver lesion 08/13/2019   Liver mass 2014   biopsied 03/2013 at [facility], not malignant.  is to undergo radiologic ablation of the mass in sept/October 2014.    Lupus ([place])    Migraines    "maybe a couple times/yr" (04/01/2013)   Obesity    Obstructive sleep apnea on CPAP 2011   Oxygen at nights   SJOGREN'S SYNDROME 08/09/2010   SLE (systemic lupus erythematosus) ([place])    Archie Endo 12/01/2010   Stroke ([place]) 2010; 10/2012   "left side is still weak from it, never fully regained full strength; no additions from stroke 10/2012"   Past Surgical History:  Past Surgical History:  Procedure Laterality Date   BREAST CYST EXCISION Left 08/2005   epidermoid   CARDIAC CATHETERIZATION  02/07/12   ECTOPIC PREGNANCY SURGERY  1999   ECTOPIC PREGNANCY SURGERY  1999   ESOPHAGOGASTRODUODENOSCOPY N/A 02/26/2013   Procedure:  ESOPHAGOGASTRODUODENOSCOPY (EGD);  Surgeon: Irene Shipper, MD;  Location: [facility] ENDOSCOPY;  Service: Endoscopy;  Laterality: N/A;   ESOPHAGOGASTRODUODENOSCOPY N/A 03/02/2015   Procedure: ESOPHAGOGASTRODUODENOSCOPY (EGD);  Surgeon: Milus Banister, MD;  Location: [place];  Service: Endoscopy;  Laterality: N/A;   HERNIA REPAIR     LEFT AND RIGHT HEART CATHETERIZATION WITH CORONARY ANGIOGRAM N/A 02/07/2012   Procedure: LEFT AND RIGHT HEART CATHETERIZATION WITH CORONARY ANGIOGRAM;  Surgeon: Laverda Page, MD;  Location: [facility] CATH LAB;  Service: Cardiovascular;  Laterality: N/A;   LIVER BIOPSY  03/2013   liver mass/medical hx noted above   MUSCLE BIOPSY     for lupus/notes [id number]   UMBILICAL HERNIA REPAIR  1980's   HPI: 50 y/o female admitted 02/18/21 with N/V and headache. EGD 7/26: No obstructive process. PMH: Lupus, Sjogren's syndrome, chronic systolic heart failure, HTN, OSA, RCVA with left hemiplegia (2010, 2014), Right Bell's Palsy (2012) with residual weakness, DM, GERD. UGI 12/04/19: Delayed transit of contrast through the gastroesophageal junction  without evidence of stricture or mass suggesting impairment of lower  esophageal sphincter relaxation.   Subjective: Pt resting in bed. No family present at this time.    Assessment / Plan / Recommendation  CHL IP CLINICAL IMPRESSIONS 02/24/2021  Clinical Impression Pt demonstrates normal swallow mechanism. Pt verbalized discomfort with swallowing solids before/during pharyngeal phase. Pt noted to have some vallecular packing/pooling with solids and then swallow  initiation at level of valleculae. Suspect pt may be struggling with decreased salivary produciton as bolus appeared dry. Encouraged drinking water with meals, moistening solids with sauce/gravy or even sipping water with bolus in the mouth. No further SLP interventions needed as pt swallow is otherwise WNL. Will sign off  SLP Visit Diagnosis Dysphagia, unspecified (R13.10)  Attention and  concentration deficit following --  Frontal lobe and executive function deficit following --  Impact on safety and function Mild aspiration risk      CHL IP TREATMENT RECOMMENDATION 02/19/2021  Treatment Recommendations Therapy as outlined in treatment plan below     Prognosis 02/19/2021  Prognosis for Safe Diet Advancement Fair  Barriers to Reach Goals Other (Comment)  Barriers/Prognosis Comment --    CHL IP DIET RECOMMENDATION 02/24/2021  SLP Diet Recommendations Regular solids;Thin liquid  Liquid Administration via Cup;Straw  Medication Administration --  Compensations --  Postural Changes --      CHL IP OTHER RECOMMENDATIONS 02/24/2021  Recommended Consults --  Oral Care Recommendations Oral care BID  Other Recommendations --      CHL IP FOLLOW UP RECOMMENDATIONS 02/24/2021  Follow up Recommendations None      CHL IP FREQUENCY AND DURATION 02/19/2021  Speech Therapy Frequency (ACUTE ONLY) min 1 x/week  Treatment Duration 1 week           CHL IP ORAL PHASE 02/24/2021  Oral Phase WFL  Oral - Pudding Teaspoon --  Oral - Pudding Cup --  Oral - Honey Teaspoon --  Oral - Honey Cup --  Oral - Nectar Teaspoon --  Oral - Nectar Cup --  Oral - Nectar Straw --  Oral - Thin Teaspoon --  Oral - Thin Cup --  Oral - Thin Straw --  Oral - Puree --  Oral - Mech Soft --  Oral - Regular --  Oral - Multi-Consistency --  Oral - Pill --  Oral Phase - Comment --    CHL IP PHARYNGEAL PHASE 02/24/2021  Pharyngeal Phase WFL  Pharyngeal- Pudding Teaspoon --  Pharyngeal --  Pharyngeal- Pudding Cup --  Pharyngeal --  Pharyngeal- Honey Teaspoon --  Pharyngeal --  Pharyngeal- Honey Cup --  Pharyngeal --  Pharyngeal- Nectar Teaspoon --  Pharyngeal --  Pharyngeal- Nectar Cup --  Pharyngeal --  Pharyngeal- Nectar Straw --  Pharyngeal --  Pharyngeal- Thin Teaspoon --  Pharyngeal --  Pharyngeal- Thin Cup --  Pharyngeal --  Pharyngeal- Thin Straw --  Pharyngeal --  Pharyngeal-  Puree --  Pharyngeal --  Pharyngeal- Mechanical Soft --  Pharyngeal --  Pharyngeal- Regular --  Pharyngeal --  Pharyngeal- Multi-consistency --  Pharyngeal --  Pharyngeal- Pill --  Pharyngeal --  Pharyngeal Comment --     No flowsheet data found.  Herbie Baltimore, MA CCC-SLP  Acute Rehabilitation Services Pager [phone] Office [phone]  Lynann Beaver 02/24/2021, 4:01 PM

## 2021-02-24 NOTE — Progress Notes (Signed)
PROGRESS NOTE    [name]  [id number] DOB: 04-Jun-1971 DOA: 02/18/2021 PCP: Bartholome Bill, MD    No chief complaint on file.   Brief Narrative:  [name] is a 50 y.o. female with history of lupus and Sjogren's syndrome, chronic systolic heart failure with improved EF, hypertension, sleep apnea, previous stroke with left-sided hemiplegia and history of Bell's palsy presented to the ER yesterday with complaints of having intractable nausea vomiting ongoing for the last 2 weeks Assessment & Plan:   Principal Problem:   Nausea & vomiting Active Problems:   SLE   Hypertension complicating diabetes ([place])   Chronic diastolic CHF (congestive heart failure) (HCC)   Worst headache of life   Type 2 diabetes mellitus with hyperlipidemia (HCC)   N&V (nausea and vomiting)   Migraine without aura and without status migrainosus, not intractable   Intractable nausea and vomiting; CT abd and pelvis shows constipation.  GI consulted , she underwent EGD showing Segmental minimal inflammation characterized by congestion (edema) and erythema was found in the gastric antrum.Marland Kitchen  Possible impaired lower esophageal sphincter relaxation seen on GI series. Normal GES last year. Normal HIDA scan.  Liver panel wnl.  Symptomatic management with anti emetics and PPI.  Advance diet as tolerated.  Pt reports she had a little vomiting, would like to stay here today and go home tomorrow.  Recommend repeating the gastric emptying study as her DM is uncontrolled .    Chronic systolic CHF.  She appears euvolemic today. Continue with strict and output, daily weights.    Headache.  Resolved.    Hypertension:  Well controlled.  Resume norvasc, bidil and coreg.    Type 2 dm;  Non insulin dependent.  CBG (last 3)  Recent Labs    02/23/21 1729 02/23/21 2142 02/24/21 0720  GLUCAP 198* 242* 250*    Resume SSI. Last A1c is 13.4. repeat A1c is pending.   Morbid Obesity;  Body mass  index is 56.15 kg/m.   SLE , SJogren's syndrome.  Resume imuran, prednisone.    CVA with left sided hemiparesis.  Resume statin.   Hypokalemia:  Replaced.    DVT prophylaxis: lovenox.  (Code Status: full code.  Family Communication: none at bedside.  Disposition:   Status is: Inpatient  Remains inpatient appropriate because:Ongoing diagnostic testing needed not appropriate for outpatient work up  Dispo: The patient is from: Home              Anticipated d/c is to: Home              Patient currently is not medically stable to d/c.   Difficult to place patient No       Level of care: Telemetry Medical Consultants:  GI   Procedures: none.   Antimicrobials:  Antibiotics Given (last 72 hours)     Date/Time Action Medication Dose   02/21/21 2201 Given   hydroxychloroquine (PLAQUENIL) tablet 200 mg 200 mg   02/22/21 1135 Given   hydroxychloroquine (PLAQUENIL) tablet 200 mg 200 mg   02/22/21 2301 Given   hydroxychloroquine (PLAQUENIL) tablet 200 mg 200 mg   02/23/21 1028 Given   hydroxychloroquine (PLAQUENIL) tablet 200 mg 200 mg   02/23/21 2243 Given   hydroxychloroquine (PLAQUENIL) tablet 200 mg 200 mg         Subjective: Nauseated and one episode of vomiting this am, .   Objective: Vitals:   02/23/21 1630 02/23/21 2143 02/24/21 0517 02/24/21 0821  BP: 125/62  127/64 110/63 (!) 145/66  Pulse: 80 74 72 76  Resp: '19 18 18 18  '$ Temp: 97.7 F (36.5 C) 98.3 F (36.8 C) 98.4 F (36.9 C) 98.3 F (36.8 C)  TempSrc: Oral Oral  Oral  SpO2:  95%  98%  Weight:        Intake/Output Summary (Last 24 hours) at 02/24/2021 1040 Last data filed at 02/24/2021 0517 Gross per 24 hour  Intake 650 ml  Output 800 ml  Net -150 ml    Filed Weights   02/20/21 0508 02/22/21 0500  Weight: (!) 164.2 kg (!) 167.5 kg    Examination:  General exam: Morbidly obese lady, not in distress.  Respiratory system: air entry fair, no wheezing heard.  Cardiovascular system:  RRR, no JVD, no murmer, pedal edema. Trace.  Gastrointestinal system: Abdomen is soft, non tender non distended bowel sounds wnl.  Central nervous system: alert and oriented, non focal  Extremities: No cyanosis Skin: No rashes seen.  Psychiatry: Mood is appropriate.     Data Reviewed: I have personally reviewed following labs and imaging studies  CBC: Recent Labs  Lab 02/18/21 1843 02/18/21 1851 02/19/21 0530 02/20/21 2319 02/24/21 0334  WBC 9.6  --  10.2 10.1 6.3  NEUTROABS 6.2  --  6.9  --   --   HGB 12.3 13.3 11.3* 10.9* 10.8*  HCT 38.9 39.0 35.5* 33.6* 33.7*  MCV 89.2  --  88.8 86.8 88.2  PLT 230  --  212 PLATELET CLUMPS NOTED ON SMEAR, UNABLE TO ESTIMATE 195     Basic Metabolic Panel: Recent Labs  Lab 02/20/21 1000 02/20/21 2319 02/22/21 0412 02/23/21 0332 02/24/21 0334  NA 139 138 136 135 136  K 3.1* 3.4* 3.7 3.8 3.2*  CL 109 107 105 105 106  CO2 24 17* 20* 22 22  GLUCOSE 189* 154* 163* 202* 251*  BUN '10 9 6 6 '$ 5*  CREATININE 0.85 0.93 0.82 0.88 1.07*  CALCIUM 9.0 9.2 8.5* 8.4* 8.6*  MG 1.7 1.9  --   --   --      GFR: Estimated Creatinine Clearance: 104.6 mL/min (A) (by C-G formula based on SCr of 1.07 mg/dL (H)).  Liver Function Tests: Recent Labs  Lab 02/18/21 1843 02/19/21 0530  AST 16 13*  ALT 12 12  ALKPHOS 92 81  BILITOT 0.7 0.8  PROT 7.2 6.7  ALBUMIN 3.8 3.5     CBG: Recent Labs  Lab 02/23/21 1228 02/23/21 1710 02/23/21 1729 02/23/21 2142 02/24/21 0720  GLUCAP 225* 174* 198* 242* 250*      Recent Results (from the past 240 hour(s))  SARS CORONAVIRUS 2 (TAT 6-24 HRS) Nasopharyngeal Nasopharyngeal Swab     Status: None   Collection Time: 02/19/21  3:20 AM   Specimen: Nasopharyngeal Swab  Result Value Ref Range Status   SARS Coronavirus 2 NEGATIVE NEGATIVE Final    Comment: (NOTE) SARS-CoV-2 target nucleic acids are NOT DETECTED.  The SARS-CoV-2 RNA is generally detectable in upper and lower respiratory specimens during  the acute phase of infection. Negative results do not preclude SARS-CoV-2 infection, do not rule out co-infections with other pathogens, and should not be used as the sole basis for treatment or other patient management decisions. Negative results must be combined with clinical observations, patient history, and epidemiological information. The expected result is Negative.  Fact Sheet for Patients: SugarRoll.be  Fact Sheet for Healthcare Providers: https://www.woods-mathews.com/  This test is not yet approved or cleared by the [place]  FDA and  has been authorized for detection and/or diagnosis of SARS-CoV-2 by FDA under an Emergency Use Authorization (EUA). This EUA will remain  in effect (meaning this test can be used) for the duration of the COVID-19 declaration under Se ction 564(b)(1) of the Act, 21 U.S.C. section 360bbb-3(b)(1), unless the authorization is terminated or revoked sooner.  Performed at [facility], [place] [address], [place], [place] [address]   Culture, blood (routine x 2)     Status: Abnormal (Preliminary result)   Collection Time: 02/20/21 10:50 PM   Specimen: BLOOD RIGHT HAND  Result Value Ref Range Status   Specimen Description BLOOD RIGHT HAND  Final   Special Requests   Final    BOTTLES DRAWN AEROBIC ONLY Blood Culture adequate volume   Culture  Setup Time   Final    AEROBIC BOTTLE ONLY GRAM POSITIVE COCCI IN CLUSTERS CRITICAL RESULT CALLED TO, READ BACK BY AND VERIFIED WITH: L SEAY PHARMD 02/21/21 2234 JDW    Culture (A)  Final    STAPHYLOCOCCUS HOMINIS THE SIGNIFICANCE OF ISOLATING THIS ORGANISM FROM A SINGLE SET OF BLOOD CULTURES WHEN MULTIPLE SETS ARE DRAWN IS UNCERTAIN. PLEASE NOTIFY THE MICROBIOLOGY DEPARTMENT WITHIN ONE WEEK IF SPECIATION AND SENSITIVITIES ARE REQUIRED. Performed at [facility], [place] [address], [place], [place] [address]    Report Status PENDING  Incomplete  Blood  Culture ID Panel (Reflexed)     Status: Abnormal   Collection Time: 02/20/21 10:50 PM  Result Value Ref Range Status   Enterococcus faecalis NOT DETECTED NOT DETECTED Final   Enterococcus Faecium NOT DETECTED NOT DETECTED Final   Listeria monocytogenes NOT DETECTED NOT DETECTED Final   Staphylococcus species DETECTED (A) NOT DETECTED Final    Comment: CRITICAL RESULT CALLED TO, READ BACK BY AND VERIFIED WITH: L SEAY PHARMD 02/21/21 2234 JDW    Staphylococcus aureus (BCID) NOT DETECTED NOT DETECTED Final   Staphylococcus epidermidis NOT DETECTED NOT DETECTED Final   Staphylococcus lugdunensis NOT DETECTED NOT DETECTED Final   Streptococcus species NOT DETECTED NOT DETECTED Final   Streptococcus agalactiae NOT DETECTED NOT DETECTED Final   Streptococcus pneumoniae NOT DETECTED NOT DETECTED Final   Streptococcus pyogenes NOT DETECTED NOT DETECTED Final   A.calcoaceticus-baumannii NOT DETECTED NOT DETECTED Final   Bacteroides fragilis NOT DETECTED NOT DETECTED Final   Enterobacterales NOT DETECTED NOT DETECTED Final   Enterobacter cloacae complex NOT DETECTED NOT DETECTED Final   Escherichia coli NOT DETECTED NOT DETECTED Final   Klebsiella aerogenes NOT DETECTED NOT DETECTED Final   Klebsiella oxytoca NOT DETECTED NOT DETECTED Final   Klebsiella pneumoniae NOT DETECTED NOT DETECTED Final   Proteus species NOT DETECTED NOT DETECTED Final   Salmonella species NOT DETECTED NOT DETECTED Final   Serratia marcescens NOT DETECTED NOT DETECTED Final   Haemophilus influenzae NOT DETECTED NOT DETECTED Final   Neisseria meningitidis NOT DETECTED NOT DETECTED Final   Pseudomonas aeruginosa NOT DETECTED NOT DETECTED Final   Stenotrophomonas maltophilia NOT DETECTED NOT DETECTED Final   Candida albicans NOT DETECTED NOT DETECTED Final   Candida auris NOT DETECTED NOT DETECTED Final   Candida glabrata NOT DETECTED NOT DETECTED Final   Candida krusei NOT DETECTED NOT DETECTED Final   Candida  parapsilosis NOT DETECTED NOT DETECTED Final   Candida tropicalis NOT DETECTED NOT DETECTED Final   Cryptococcus neoformans/gattii NOT DETECTED NOT DETECTED Final    Comment: Performed at [facility], [address] [place], [place] [address]  Culture, blood (routine x 2)     Status: Abnormal (Preliminary result)   Collection Time: 02/20/21 11:30 PM   Specimen: BLOOD RIGHT HAND  Result Value Ref Range Status   Specimen Description BLOOD RIGHT HAND  Final   Special Requests   Final    BOTTLES DRAWN AEROBIC ONLY Blood Culture results may not be optimal due to an inadequate volume of blood received in culture bottles   Culture  Setup Time   Final    AEROBIC BOTTLE ONLY GRAM POSITIVE COCCI IN CLUSTERS CRITICAL VALUE NOTED.  VALUE IS CONSISTENT WITH PREVIOUSLY REPORTED AND CALLED VALUE.    Culture (A)  Final    STAPHYLOCOCCUS CAPITIS THE SIGNIFICANCE OF ISOLATING THIS ORGANISM FROM A SINGLE SET OF BLOOD CULTURES WHEN MULTIPLE SETS ARE DRAWN IS UNCERTAIN. PLEASE NOTIFY THE MICROBIOLOGY DEPARTMENT WITHIN ONE WEEK IF SPECIATION AND SENSITIVITIES ARE REQUIRED. Performed at [facility], [place] [address] [place], [place] [address]    Report Status PENDING  Incomplete  Urine Culture     Status: Abnormal   Collection Time: 02/21/21  5:21 PM   Specimen: Urine, Clean Catch  Result Value Ref Range Status   Specimen Description URINE, CLEAN CATCH  Final   Special Requests   Final    Immunocompromised Performed at [facility], [place] [address] [place], [place] [address]    Culture MULTIPLE SPECIES PRESENT, SUGGEST RECOLLECTION (A)  Final   Report Status 02/22/2021 FINAL  Final          Radiology Studies: NM Hepato W/EF  Result Date: 02/22/2021 CLINICAL DATA:  Nausea and vomiting. EXAM: NUCLEAR MEDICINE HEPATOBILIARY IMAGING WITH GALLBLADDER EF TECHNIQUE: Sequential images of the abdomen were obtained out to 60 minutes following intravenous administration of  radiopharmaceutical. After oral ingestion of Ensure, gallbladder ejection fraction was determined. At 60 min, normal ejection fraction is greater than 33%. RADIOPHARMACEUTICALS:  5.5 mCi Tc-74m Choletec IV COMPARISON:  CT February 18, 2021 FINDINGS: There is prompt, uniform radiotracer uptake by the liver with normal filling of the intrahepatic ducts, common bile duct. Gallbladder activity is visualized, consistent with patency of cystic duct (normal < 60 minutes). Additionally there is normal biliary to bowel transit (normal < 60 minutes), consistent with patent common bile duct. Ensure was administered and the gallbladder appears to empty normally on sequential images. Calculated gallbladder ejection fraction is 35%. (Normal gallbladder ejection fraction with Ensure is greater than 33%.) No evidence of enterogastric biliary reflux. IMPRESSION: 1.  Patent cystic and common bile ducts. 2.  Normal gallbladder ejection fraction. Electronically Signed   By: JDahlia BailiffMD   On: 02/22/2021 11:56        Scheduled Meds:  amLODipine  10 mg Oral Daily   ARIPiprazole  5 mg Oral Daily   azaTHIOprine  150 mg Oral BID   carvedilol  37.5 mg Oral BID WC   enoxaparin (LOVENOX) injection  80 mg Subcutaneous Q24H   fluticasone  1 spray Each Nare Daily   guaiFENesin  1,200 mg Oral BID   hydroxychloroquine  200 mg Oral BID   insulin aspart  0-15 Units Subcutaneous TID WC   insulin detemir  30 Units Subcutaneous BID   isosorbide-hydrALAZINE  2 tablet Oral TID   loratadine  10 mg Oral Daily   pantoprazole (PROTONIX) IV  40 mg Intravenous Q12H   polyethylene glycol  17 g Oral TID   pravastatin  10 mg Oral QPC supper   predniSONE  1 mg  Oral BID   pregabalin  75 mg Oral TID   sacubitril-valsartan  1 tablet Oral BID   senna-docusate  1 tablet Oral BID   sodium phosphate  1 enema Rectal Once   spironolactone  50 mg Oral Daily   topiramate  75 mg Oral QHS   Continuous Infusions:  promethazine (PHENERGAN)  injection (IM or IVPB) 6.25 mg (02/24/21 0915)     LOS: 5 days        Hosie Poisson, MD [facility]   To contact the attending provider between 7A-7P or the covering provider during after hours 7P-7A, please log into the web site [email or web address] and access using universal [place] password for that web site. If you do not have the password, please call the hospital operator.  02/24/2021, 10:40 AM

## 2021-02-24 NOTE — Evaluation (Signed)
Occupational Therapy Evaluation Patient Details Name: [name] MRN: [id number] DOB: [birth year] Today's Date: 02/24/2021    History of Present Illness Pt is a 50 y.o. F who presents with intractable N/V and severe headache. MRI negative. Plan for EGD. Significant PMH: lupus, Sjogren's syndrome, CHF, HTN, previous stroke with left sided hemiplegia, Bell's Palsy.   Clinical Impression   Pt agreed to session with Physical therapy present as well. Pt at this time limited in session as reports of headache and dizziness with activity. Per nursing had medications prior to session. Pt at this time was able to complete bed mobility with supervision and increase time. Sit to stand from elevated surfaces with supervision to min guard, ambulation in room level with RW to commode with min guard and hygiene with min guard with use of grab bar and RW. Plan to take orthostatics with activity. Pt currently with functional limitations due to the deficits listed below (see OT Problem List).  Pt will benefit from skilled OT to increase their safety and independence with ADL and functional mobility for ADL to facilitate discharge to venue listed below.      Follow Up Recommendations  Supervision - Intermittent;Home health OT    Equipment Recommendations       Recommendations for Other Services       Precautions / Restrictions Precautions Precautions: Fall Precaution Comments: per pt reporting dizziness and headache Restrictions Weight Bearing Restrictions: No      Mobility Bed Mobility Overal bed mobility: Needs Assistance Bed Mobility: Supine to Sit;Sit to Supine     Supine to sit: Supervision Sit to supine: Supervision   General bed mobility comments: increase time to complete    Transfers Overall transfer level: Needs assistance Equipment used: Rolling walker (2 wheeled) Transfers: Sit to/from Stand Sit to Stand: Supervision;From elevated surface         General transfer comment:  increase time and cues for hand placement    Balance Overall balance assessment: Needs assistance Sitting-balance support: Feet supported Sitting balance-Leahy Scale: Good     Standing balance support: Bilateral upper extremity supported Standing balance-Leahy Scale: Poor Standing balance comment: reliant on RW                           ADL either performed or assessed with clinical judgement   ADL Overall ADL's : Needs assistance/impaired Eating/Feeding: Set up;Sitting   Grooming: Wash/dry hands;Wash/dry face;Min guard;Standing   Upper Body Bathing: Min guard;Sitting   Lower Body Bathing: Moderate assistance;Cueing for safety;Cueing for sequencing;Sit to/from stand   Upper Body Dressing : Min guard;Sitting   Lower Body Dressing: Moderate assistance;Sit to/from stand;Cueing for safety;Cueing for sequencing   Toilet Transfer: Min guard;Cueing for safety;Cueing for sequencing   Toileting- Clothing Manipulation and Hygiene: Minimal assistance;Cueing for safety;Cueing for sequencing;Sit to/from stand       Functional mobility during ADLs: Min guard;+2 for physical assistance;+2 for safety/equipment;Cueing for safety;Cueing for sequencing;Rolling walker (pt started to become dizzy in session)       Vision Patient Visual Report: No change from baseline       Perception     Praxis      Pertinent Vitals/Pain Pain Assessment: 0-10 (Simultaneous filing. User may not have seen previous data.) Pain Score: 10-Worst pain ever Faces Pain Scale: Hurts a little bit Pain Location: headache, pt had medication prior to sesion Pain Descriptors / Indicators: Constant Pain Intervention(s): Monitored during session     Hand Dominance Right  Extremity/Trunk Assessment Upper Extremity Assessment Upper Extremity Assessment: Generalized weakness (pmh of CVA)   Lower Extremity Assessment Lower Extremity Assessment: Defer to PT evaluation   Cervical / Trunk  Assessment Cervical / Trunk Assessment: Other exceptions Cervical / Trunk Exceptions: increased body habitus   Communication Communication Communication: No difficulties   Cognition Arousal/Alertness: Awake/alert Behavior During Therapy: WFL for tasks assessed/performed Overall Cognitive Status: Within Functional Limits for tasks assessed                                     General Comments       Exercises     Shoulder Instructions      Home Living Family/patient expects to be discharged to:: Private residence Living Arrangements: Alone Available Help at Discharge: Family;Personal care attendant Type of Home: House Home Access: Stairs to enter [employer] of Steps: 6 Entrance Stairs-Rails: Right;Left;Can reach both Home Layout: One level     Bathroom Shower/Tub: Teacher, early years/pre: Handicapped height Bathroom Accessibility: Yes How Accessible: Accessible via walker Home Equipment: [place] - 2 wheels;Walker - 4 wheels;Cane - single point          Prior Functioning/Environment Level of Independence: Needs assistance  Gait / Transfers Assistance Needed: uses cane vs walker, household ambulator ADL's / Homemaking Assistance Needed: aide assists 7 days/wk, ~3 hours/day. sister brings meals            OT Problem List: Decreased strength;Decreased activity tolerance;Decreased range of motion;Impaired balance (sitting and/or standing);Decreased safety awareness;Obesity;Pain      OT Treatment/Interventions: Self-care/ADL training;Therapeutic exercise;Energy conservation;Therapeutic activities;Patient/family education;Balance training    OT Goals(Current goals can be found in the care plan section) Acute Rehab OT Goals Patient Stated Goal: have a dark room OT Goal Formulation: With patient Time For Goal Achievement: 03/13/21 Potential to Achieve Goals: Good ADL Goals Pt Will Perform Upper Body Bathing: with set-up;sitting Pt  Will Perform Lower Body Bathing: with supervision;sit to/from stand Pt Will Transfer to Toilet: with supervision;ambulating Pt Will Perform Tub/Shower Transfer: with supervision;rolling walker  OT Frequency: Min 2X/week   Barriers to D/C:            Co-evaluation              AM-PAC OT "6 Clicks" Daily Activity     Outcome Measure Help from another person eating meals?: None Help from another person taking care of personal grooming?: A Little Help from another person toileting, which includes using toliet, bedpan, or urinal?: A Lot Help from another person bathing (including washing, rinsing, drying)?: A Lot Help from another person to put on and taking off regular upper body clothing?: A Little Help from another person to put on and taking off regular lower body clothing?: A Lot 6 Click Score: 16   End of Session Equipment Utilized During Treatment: Gait belt;Rolling walker Nurse Communication: Mobility status (headache)  Activity Tolerance: Patient limited by lethargy;Patient limited by pain Patient left: in bed;with call bell/phone within reach  OT Visit Diagnosis: Unsteadiness on feet (R26.81);Other abnormalities of gait and mobility (R26.89);Muscle weakness (generalized) (M62.81);Pain Pain - Right/Left:  (head)                Time: [id number] OT Time Calculation (min): 31 min Charges:  OT General Charges $OT Visit: 1 Visit OT Evaluation $OT Eval Low Complexity: [place] OTR/L  [place]  [phone] office number  [phone] pager number   Joeseph Amor 02/24/2021, 11:27 AM

## 2021-02-24 NOTE — Progress Notes (Signed)
Physical Therapy Treatment Patient Details Name: [name] MRN: [id number] DOB: [birth year] Today's Date: 02/24/2021    History of Present Illness Pt is a 50 y.o. F who presents with intractable N/V and severe headache. MRI negative. Plan for EGD. Significant PMH: lupus, Sjogren's syndrome, CHF, HTN, previous stroke with left sided hemiplegia, Bell's Palsy.    PT Comments    Continuing work on functional mobility and activity tolerance;  session focused on functional transfers and in room ambulation, folded in with the ADL of toileting; Worked with OT this session for safety, and due to pt's N/V and HA with decr activity tolerance; Noting pt quite fatigued after walk to the bathroom; opted to bring the bed closer to doorway of bathroom due to pt's dizziness, and being less responsive/slower to respond after using the toilet   Follow Up Recommendations  Home health PT;Supervision for mobility/OOB     Equipment Recommendations  None recommended by PT    Recommendations for Other Services       Precautions / Restrictions Precautions Precautions: Fall Precaution Comments: per pt reporting dizziness and headache Restrictions Weight Bearing Restrictions: No    Mobility  Bed Mobility Overal bed mobility: Needs Assistance Bed Mobility: Supine to Sit;Sit to Supine     Supine to sit: Supervision Sit to supine: Supervision   General bed mobility comments: increase time to complete    Transfers Overall transfer level: Needs assistance Equipment used: Rolling walker (2 wheeled) Transfers: Sit to/from Stand Sit to Stand: Min guard         General transfer comment: increase time and cues for hand placement; moved through heavy forward lean  Ambulation/Gait Ambulation/Gait assistance: Min guard Gait Distance (Feet): 20 Feet (15, to toilet, then 5) Assistive device: Rolling walker (2 wheeled) Gait Pattern/deviations: Step-through pattern;Decreased stride length;Trunk  flexed Gait velocity: decreased   General Gait Details: Very slow and effortful, moderate reliance through arms on walker, min guard for safety. Fatigues easily; cues to self-monitor for activity tolerance; opted to move the bed closer to her after she was fatigued using the bathroom, and standing for hygeine   Stairs             Wheelchair Mobility    Modified Rankin (Stroke Patients Only)       Balance Overall balance assessment: Needs assistance Sitting-balance support: Feet supported Sitting balance-Leahy Scale: Good     Standing balance support: Bilateral upper extremity supported Standing balance-Leahy Scale: Poor Standing balance comment: reliant on RW                            Cognition Arousal/Alertness: Awake/alert Behavior During Therapy: WFL for tasks assessed/performed Overall Cognitive Status: Within Functional Limits for tasks assessed                                        Exercises      General Comments General comments (skin integrity, edema, etc.): Dizzy and fatigued after using the bathroom; BP sitting 148/93, HR 81      Pertinent Vitals/Pain Pain Assessment: 0-10 Pain Score: 10-Worst pain ever Faces Pain Scale: Hurts a little bit Pain Location: headache, pt had medication prior to sesion Pain Descriptors / Indicators: Constant Pain Intervention(s): Monitored during session    Home Living Family/patient expects to be discharged to:: Private residence Living Arrangements: Alone Available Help at Discharge:  Family;Personal care attendant Type of Home: House Home Access: Stairs to enter Entrance Stairs-Rails: Right;Left;Can reach both Home Layout: One level Home Equipment: Environmental consultant - 2 wheels;Walker - 4 wheels;Cane - single point      Prior Function Level of Independence: Needs assistance  Gait / Transfers Assistance Needed: uses cane vs walker, household ambulator ADL's / Homemaking Assistance Needed: aide  assists 7 days/wk, ~3 hours/day. sister brings meals     PT Goals (current goals can now be found in the care plan section) Acute Rehab PT Goals Patient Stated Goal: have a dark room due to migraines PT Goal Formulation: With patient Time For Goal Achievement: 03/08/21 Potential to Achieve Goals: Good Progress towards PT goals: Progressing toward goals (slowly)    Frequency    Min 3X/week      PT Plan Current plan remains appropriate    Co-evaluation PT/OT/SLP Co-Evaluation/Treatment: Yes Reason for Co-Treatment: For patient/therapist safety PT goals addressed during session: Mobility/safety with mobility OT goals addressed during session: ADL's and self-care      AM-PAC PT "6 Clicks" Mobility   Outcome Measure  Help needed turning from your back to your side while in a flat bed without using bedrails?: None Help needed moving from lying on your back to sitting on the side of a flat bed without using bedrails?: A Little Help needed moving to and from a bed to a chair (including a wheelchair)?: A Little Help needed standing up from a chair using your arms (e.g., wheelchair or bedside chair)?: A Little Help needed to walk in hospital room?: A Little Help needed climbing 3-5 steps with a railing? : A Lot 6 Click Score: 18    End of Session Equipment Utilized During Treatment: Gait belt Activity Tolerance: Patient tolerated treatment well Patient left: in bed;with call bell/phone within reach Nurse Communication: Mobility status PT Visit Diagnosis: Unsteadiness on feet (R26.81);Muscle weakness (generalized) (M62.81);Difficulty in walking, not elsewhere classified (R26.2)     Time: 1000-1024 PT Time Calculation (min) (ACUTE ONLY): 24 min  Charges:  $Gait Training: 8-22 mins                     Roney [place], [place]  Acute Rehabilitation Services Pager [phone] Office [phone]    [name] 02/24/2021, 1:05 PM

## 2021-02-24 NOTE — Progress Notes (Signed)
[facility] Progress Note  [name] 50 y.o. 03-14-1971   Subjective: Tolerating liquid diet. Denies vomiting. Denies abdominal pain. Wants to go home.  Objective: Vital signs: Vitals:   02/24/21 0517 02/24/21 0821  BP: 110/63 (!) 145/66  Pulse: 72 76  Resp: 18 18  Temp: 98.4 F (36.9 C) 98.3 F (36.8 C)  SpO2:  98%    Physical Exam: Gen: lethargic, morbidly obese, no acute distress  HEENT: anicteric sclera CV: RRR Chest: CTA B Abd: epigastric tenderness with guarding, soft, nondistended, obese Ext: no edema  Lab Results: Recent Labs    02/23/21 0332 02/24/21 0334  NA 135 136  K 3.8 3.2*  CL 105 106  CO2 22 22  GLUCOSE 202* 251*  BUN 6 5*  CREATININE 0.88 1.07*  CALCIUM 8.4* 8.6*   No results for input(s): AST, ALT, ALKPHOS, BILITOT, PROT, ALBUMIN in the last 72 hours. Recent Labs    02/24/21 0334  WBC 6.3  HGB 10.8*  HCT 33.7*  MCV 88.2  PLT 195      Assessment/Plan: Intractable nausea/vomiting likely multifactorial from meds and comorbidities. No obstructive process seen on EGD. Gastritis would not cause her N/V. Anti-emetics prn. No new GI recs. F/U with GI prn. Will sign off.    Lear Ng 02/24/2021, 9:11 AM  Questions please call [phone] Patient ID: [name], female   DOB: 1971/01/28, 50 y.o.   MRN: [id number]

## 2021-02-25 ENCOUNTER — Ambulatory Visit: Payer: Medicaid Other | Admitting: Endocrinology

## 2021-02-25 ENCOUNTER — Encounter (HOSPITAL_COMMUNITY): Payer: Self-pay | Admitting: Gastroenterology

## 2021-02-25 LAB — GLUCOSE, CAPILLARY
Glucose-Capillary: 222 mg/dL — ABNORMAL HIGH (ref 70–99)
Glucose-Capillary: 228 mg/dL — ABNORMAL HIGH (ref 70–99)

## 2021-02-25 LAB — BASIC METABOLIC PANEL
Anion gap: 7 (ref 5–15)
BUN: 8 mg/dL (ref 6–20)
CO2: 24 mmol/L (ref 22–32)
Calcium: 8.7 mg/dL — ABNORMAL LOW (ref 8.9–10.3)
Chloride: 108 mmol/L (ref 98–111)
Creatinine, Ser: 0.99 mg/dL (ref 0.44–1.00)
GFR, Estimated: >60 mL/min (ref >60)
Glucose, Bld: 237 mg/dL — ABNORMAL HIGH (ref 70–99)
Potassium: 3.7 mmol/L (ref 3.5–5.1)
Sodium: 139 mmol/L (ref 135–145)

## 2021-02-25 LAB — CULTURE, BLOOD (ROUTINE X 2)

## 2021-02-25 MED ORDER — INSULIN DETEMIR 100 UNIT/ML ~~LOC~~ SOLN
35.0000 [IU] | Freq: Two times a day (BID) | SUBCUTANEOUS | Status: DC
  Filled 2021-02-25: qty 0.35

## 2021-02-25 MED ORDER — INSULIN ASPART 100 UNIT/ML IJ SOLN
0.0000 [IU] | Freq: Three times a day (TID) | INTRAMUSCULAR | Status: DC
  Administered 2021-02-25: 7 [IU] via SUBCUTANEOUS

## 2021-02-25 MED ORDER — TRAMADOL HCL 50 MG PO TABS: 50.0000 mg | ORAL_TABLET | Freq: Two times a day (BID) | ORAL | 0 refills | Status: AC | PRN

## 2021-02-25 MED ORDER — PANTOPRAZOLE SODIUM 40 MG PO TBEC: 40.0000 mg | DELAYED_RELEASE_TABLET | Freq: Two times a day (BID) | ORAL | 1 refills | Status: DC

## 2021-02-25 MED ORDER — ONDANSETRON 4 MG PO TBDP: 4.0000 mg | ORAL_TABLET | Freq: Three times a day (TID) | ORAL | 0 refills | Status: DC | PRN

## 2021-02-25 MED ORDER — INSULIN ASPART 100 UNIT/ML IJ SOLN: 0.0000 [IU] | Freq: Every day | INTRAMUSCULAR | Status: DC

## 2021-02-25 NOTE — Plan of Care (Signed)

## 2021-02-25 NOTE — TOC Progression Note (Signed)
Transition of Care [facility]) - Progression Note    Patient Details  Name: [name] MRN: [id number] Date of Birth: Oct 24, 1970  Transition of Care [facility]) CM/SW Contact  Loletha Grayer Beverely Pace, RN Phone Number: 02/25/2021, 1:59 PM  Clinical Narrative:   Case manager spoke with patient concerning discharge needs. Patient has PCA services through [facility]. CM explained that we would  arrange for Home Health therapies through an Agency that accepts her coverage. Referral was called to [facility]. Patient states that her sister is primarily her care provider.          Expected Discharge Plan and Services           Expected Discharge Date: 02/25/21                                     Social Determinants of Health (SDOH) Interventions    Readmission Risk Interventions Readmission Risk Prevention Plan 02/05/2020  Transportation Screening Complete  Medication Review Press photographer) Referral to Pharmacy  [place] or [place] Complete  SW Recovery Care/Counseling Consult Complete  Palliative Care Screening Not [place] Not Applicable  Some recent data might be hidden

## 2021-02-25 NOTE — Progress Notes (Signed)
Inpatient Diabetes Program Recommendations  AACE/ADA: New Consensus Statement on Inpatient Glycemic Control (2015)  Target Ranges:  Prepandial:   less than 140 mg/dL      Peak postprandial:   less than 180 mg/dL (1-2 hours)      Critically ill patients:  140 - 180 mg/dL   Lab Results  Component Value Date   GLUCAP 222 (H) 02/25/2021   HGBA1C 9.9 (H) 02/24/2021    Review of Glycemic Control Results for [name], [name] (MRN [id number]) as of 02/25/2021 10:20  Ref. Range 02/24/2021 07:20 02/24/2021 11:21 02/24/2021 16:38 02/24/2021 20:43 02/25/2021 07:24  Glucose-Capillary Latest Ref Range: 70 - 99 mg/dL 250 (H) 266 (H) 321 (H) 305 (H) 222 (H)    Inpatient Diabetes Program Recommendations:   -Add hs 0-5 units Novolog correction Secure chat sent to Dr. Karleen Hampshire.  Thank you, Nani Gasser. Denney Shein, RN, MSN, CDE  Diabetes Coordinator Inpatient Glycemic Control Team Team Pager [phone] (8am-5pm) 02/25/2021 10:20 AM

## 2021-02-26 NOTE — Discharge Summary (Signed)
Physician Discharge Summary  ANUSHRI CASALINO ZOX:[id number] DOB: 1971-04-25 DOA: 02/18/2021  PCP: Bartholome Bill, MD  Admit date: 02/18/2021 Discharge date: 02/25/2021  Admitted From: Home.  Disposition:  Home.   Recommendations for Outpatient Follow-up:  Follow up with PCP in 1-2 weeks Please obtain BMP/CBC in one week Please follow up with Gastroenterology and recommend getting a repeat GES .     Discharge Condition:stable.  CODE STATUS: full code.  Diet recommendation: Heart Healthy / Carb Modified  Brief/Interim Summary: ANICA ALCARAZ is a 50 y.o. female with history of lupus and Sjogren's syndrome, chronic systolic heart failure with improved EF, hypertension, sleep apnea, previous stroke with left-sided hemiplegia and history of Bell's palsy presented to the ER yesterday with complaints of having intractable nausea vomiting ongoing for the last 2 weeks prior to admission.   Discharge Diagnoses:  Principal Problem:   Nausea & vomiting Active Problems:   SLE   Hypertension complicating diabetes ([place])   Chronic diastolic CHF (congestive heart failure) (HCC)   Worst headache of life   Type 2 diabetes mellitus with hyperlipidemia (HCC)   N&V (nausea and vomiting)   Migraine without aura and without status migrainosus, not intractable    Intractable nausea and vomiting; CT abd and pelvis shows constipation. GI consulted , she underwent EGD showing Segmental minimal inflammation characterized by congestion (edema) and erythema was found in the gastric antrum.Marland Kitchen  Possible impaired lower esophageal sphincter relaxation seen on GI series. Normal GES last year. Normal HIDA scan. Liver panel wnl. Symptomatic management with anti emetics and PPI. Advanced diet as tolerated. no more vomiting today, slightly nauseated.  Recommend repeating the gastric emptying study as her DM is uncontrolled .     Chronic systolic CHF. She appears euvolemic today. Continue with strict and output,  daily weights.      Headache. Resolved.     Hypertension: Well controlled. Resume norvasc, bidil and coreg.     Type 2 dm; Non insulin dependent. Resume home meds.    Resume SSI. Last A1c is 13.4. repeat A1c is 9.9 improved from previous.    Morbid Obesity;  Body mass index is 56.15 kg/m.     SLE , SJogren's syndrome. Resume imuran, prednisone.     CVA with left sided hemiparesis. Resume statin.    Hypokalemia: Replaced.  Discharge Instructions  Discharge Instructions     Diet - low sodium heart healthy   Complete by: As directed    Discharge instructions   Complete by: As directed    Please follow up with PCp in one week.      Allergies as of 02/25/2021       Reactions   Metoclopramide Other (See Comments)   Developed restless leg, akathisia type limb movements.    Codeine Itching, Rash, Other (See Comments)   Hydrocodone Rash   Penicillins Itching   Tolerated Unasyn 02/01/2020   Percocet [oxycodone-acetaminophen] Hives, Rash   Tolerates dilaudid Tolerates acetaminophen         Medication List     STOP taking these medications    esomeprazole 40 MG capsule Commonly known as: NEXIUM   oxyCODONE-acetaminophen 5-325 MG tablet Commonly known as: PERCOCET/ROXICET       TAKE these medications    Advair HFA 230-21 MCG/ACT inhaler Generic drug: fluticasone-salmeterol Inhale 1-2 puffs into the lungs 2 (two) times daily.   albuterol (2.5 MG/3ML) 0.083% nebulizer solution Commonly known as: PROVENTIL Take 2.5 mg by nebulization every 6 (six) hours as needed  for wheezing. What changed: Another medication with the same name was removed. Continue taking this medication, and follow the directions you see here.   ALPRAZolam 0.5 MG tablet Commonly known as: XANAX Take 0.5 mg 2 (two) times daily as needed by mouth for anxiety or sleep.   amLODipine 10 MG tablet Commonly known as: NORVASC Take 1 tablet (10 mg total) by mouth daily.    ARIPiprazole 5 MG tablet Commonly known as: ABILIFY Take 5 mg by mouth daily.   aspirin EC 81 MG tablet Take 81 mg by mouth daily. Swallow whole.   Azathioprine 75 MG Tabs Take 150 mg by mouth 2 (two) times daily. What changed: Another medication with the same name was removed. Continue taking this medication, and follow the directions you see here.   B-D UF III MINI PEN NEEDLES 31G X 5 MM Misc Generic drug: Insulin Pen Needle USE AS DIRECTED TWICE A DAY What changed:  how much to take how to take this when to take this additional instructions   Easy Touch Pen Needles 32G X 4 MM Misc Generic drug: Insulin Pen Needle USE TO INJECT INSULIN TWICE DAILY What changed: See the new instructions.   Benlysta 200 MG/ML Soaj Generic drug: Belimumab Inject 200 mg into the skin once a week. Thursdays   benzonatate 100 MG capsule Commonly known as: TESSALON Take 1 capsule by mouth 3 (three) times daily as needed for cough.   buPROPion 300 MG 24 hr tablet Commonly known as: WELLBUTRIN XL Take 300 mg by mouth daily.   carvedilol 25 MG tablet Commonly known as: COREG Take 1.5 tablets (37.5 mg total) by mouth 2 (two) times daily with a meal.   cetirizine 10 MG tablet Commonly known as: ZYRTEC Take 10 mg by mouth daily as needed for allergies.   clotrimazole-betamethasone cream Commonly known as: LOTRISONE Apply 1 application topically 2 (two) times daily. What changed:  when to take this reasons to take this   cromolyn 4 % ophthalmic solution Commonly known as: OPTICROM Place 1 drop into both eyes 2 (two) times daily.   cycloSPORINE 0.05 % ophthalmic emulsion Commonly known as: RESTASIS Place 1 drop into both eyes in the morning, at noon, and at bedtime.   diclofenac Sodium 1 % Gel Commonly known as: VOLTAREN Apply 2 g topically 4 (four) times daily. What changed:  when to take this reasons to take this   DULoxetine 60 MG capsule Commonly known as: CYMBALTA Take  120 mg by mouth at bedtime.   famotidine 20 MG tablet Commonly known as: PEPCID Take 20 mg by mouth 2 (two) times daily.   fluticasone 50 MCG/ACT nasal spray Commonly known as: FLONASE Place 2 sprays into both nostrils continuous as needed for allergies or rhinitis.   HumaLOG KwikPen 100 UNIT/ML KwikPen Generic drug: insulin lispro Inject 10-20 Units into the skin 3 (three) times daily.   HumuLIN N KwikPen 100 UNIT/ML Kiwkpen Generic drug: Insulin NPH (Human) (Isophane) Inject 2.2 mLs (220 Units total) into the skin every morning   hydroxychloroquine 200 MG tablet Commonly known as: PLAQUENIL Take 200 mg by mouth 2 (two) times daily.   Insulin Syringe-Needle U-100 31G X 5/16" 1 ML Misc Commonly known as: Easy Touch Insulin Syringe USE AS DIRECTED THREE TIMES A DAY What changed:  how much to take how to take this when to take this additional instructions   isosorbide-hydrALAZINE 20-37.5 MG tablet Commonly known as: BiDil TAKE TWO (2) TABLETS BY MOUTH THREE TIMES DAILY  levothyroxine 50 MCG tablet Commonly known as: SYNTHROID Take 1 tablet (50 mcg total) by mouth daily at 6 (six) AM.   megestrol 40 MG tablet Commonly known as: MEGACE Take 40 mg by mouth daily.   metFORMIN 500 MG 24 hr tablet Commonly known as: GLUCOPHAGE-XR Take 2 tablets (1,000 mg total) by mouth 2 (two) times daily.   multivitamin with minerals Tabs tablet Take 1 tablet by mouth daily.   ondansetron 4 MG disintegrating tablet Commonly known as: Zofran ODT Take 1 tablet (4 mg total) by mouth every 8 (eight) hours as needed for nausea or vomiting.   onetouch ultrasoft lancets Test 4 times daily DX E11.9   OneTouch Verio test strip Generic drug: glucose blood Test 4 times daily DX E11.9   glucose blood test strip Test 4 times daily DX E11.9   OneTouch Verio w/Device Kit Test 4 times daily.  DX E11.9   oxybutynin 15 MG 24 hr tablet Commonly known as: DITROPAN XL Take 15 mg by mouth  daily.   pantoprazole 40 MG tablet Commonly known as: PROTONIX Take 1 tablet (40 mg total) by mouth 2 (two) times daily.   polyethylene glycol 17 g packet Commonly known as: MIRALAX / GLYCOLAX Take 17 g by mouth daily.   pravastatin 10 MG tablet Commonly known as: PRAVACHOL Take 1 tablet (10 mg total) by mouth daily after supper.   predniSONE 1 MG tablet Commonly known as: DELTASONE Take 1 mg by mouth 2 (two) times daily.   pregabalin 75 MG capsule Commonly known as: LYRICA Take 75 mg 3 (three) times daily by mouth.   QUEtiapine 300 MG tablet Commonly known as: SEROQUEL Take 300 mg by mouth at bedtime.   sacubitril-valsartan 97-103 MG Commonly known as: ENTRESTO Take 1 tablet by mouth 2 (two) times daily.   spironolactone 50 MG tablet Commonly known as: ALDACTONE Take 1 tablet (50 mg total) by mouth daily.   sucralfate 1 GM/10ML suspension Commonly known as: CARAFATE Take 10 mLs by mouth 2 (two) times daily.   topiramate 25 MG tablet Commonly known as: TOPAMAX Take 75 mg by mouth at bedtime.   torsemide 20 MG tablet Commonly known as: DEMADEX Take 20 mg by mouth daily as needed for fluid.   traMADol 50 MG tablet Commonly known as: Ultram Take 1 tablet (50 mg total) by mouth every 12 (twelve) hours as needed for up to 3 days.   Victoza 18 MG/3ML Sopn Generic drug: liraglutide Inject 1.8 mg into the skin every morning.        Follow-up Information     [facility] Follow up.   Specialty: [place] Why: a representativee from [facility] will contact you to arrsnge start date and time for your therapy. Contact information: [place] [address] [phone]         Bartholome Bill, MD. Schedule an appointment as soon as possible for a visit in 1 week(s).   Specialty: Family Medicine Contact information: [place]  [address] [phone]                Allergies  Allergen Reactions   Metoclopramide Other (See Comments)    Developed restless leg, akathisia type limb movements.    Codeine Itching, Rash and Other (See Comments)   Hydrocodone Rash   Penicillins Itching    Tolerated Unasyn 02/01/2020   Percocet [Oxycodone-Acetaminophen] Hives and Rash  Tolerates dilaudid Tolerates acetaminophen     Consultations: gastroenterology   Procedures/Studies: CT ABDOMEN PELVIS WO CONTRAST  Result Date: 02/18/2021 CLINICAL DATA:  Bowel obstruction suspected Abdominal pain. EXAM: CT ABDOMEN AND PELVIS WITHOUT CONTRAST TECHNIQUE: Multidetector CT imaging of the abdomen and pelvis was performed following the standard protocol without IV contrast. COMPARISON:  CT 6 days ago 02/12/2021 FINDINGS: Lower chest: Subsegmental atelectasis/scarring in the left lower lobe. Mild cardiomegaly. Hepatobiliary: No focal hepatic abnormality is seen. Gallbladder physiologically distended, no calcified stone. No biliary dilatation. Pancreas: No ductal dilatation or inflammation. Spleen: Normal in size without focal abnormality. Adrenals/Urinary Tract: No adrenal nodule. Excreted IV contrast in the renal collecting systems from head and neck CTA earlier today. No hydronephrosis. Partially distended urinary bladder. Stomach/Bowel: Physiologically distended stomach. There is no small bowel obstruction, ileus, or inflammation. Decompressed small bowel. Normal appendix. Moderate volume of colonic stool. No colonic wall thickening or pericolonic edema. Sigmoid colon minimal early redundant. Vascular/Lymphatic: Normal caliber abdominal aorta. No portal venous or mesenteric gas. No abdominopelvic adenopathy. Reproductive: Uterus and bilateral adnexa are unremarkable. Other: No ascites, free air or focal fluid collection. No abdominal wall hernia. Musculoskeletal: There are no acute or suspicious osseous abnormalities. IMPRESSION: 1. No acute  abnormality or explanation for abdominal pain. No bowel obstruction. 2. Moderate colonic stool burden, can be seen with constipation. Electronically Signed   By: Keith Rake M.D.   On: 02/18/2021 21:27   DG Chest 2 View  Result Date: 02/17/2021 CLINICAL DATA:  Upper back pain radiating to the chest EXAM: CHEST - 2 VIEW COMPARISON:  02/12/2021 FINDINGS: Cardiomegaly. Stable mediastinal contours. Interstitial crowding in the setting of mildly low lung volumes. Borderline vascular congestion on the lateral view. No edema, effusion, or consolidation. IMPRESSION: Cardiomegaly and borderline vascular congestion. Electronically Signed   By: Monte Fantasia M.D.   On: 02/17/2021 05:11   DG Chest 2 View  Result Date: 02/12/2021 CLINICAL DATA:  Chest pain EXAM: CHEST - 2 VIEW COMPARISON:  None. FINDINGS: Normal mediastinum and cardiac silhouette. Normal pulmonary vasculature. No evidence of effusion, infiltrate, or pneumothorax. No acute bony abnormality. IMPRESSION: No acute cardiopulmonary process. Electronically Signed   By: Suzy Bouchard M.D.   On: 02/12/2021 17:00   MR BRAIN WO CONTRAST  Result Date: 02/19/2021 CLINICAL DATA:  Subarachnoid hemorrhage [facility]) suspected.  Headache. EXAM: MRI HEAD WITHOUT CONTRAST TECHNIQUE: Multiplanar, multiecho pulse sequences of the brain and surrounding structures were obtained without intravenous contrast. COMPARISON:  None. FINDINGS: Brain: No acute infarct, mass effect or extra-axial collection. No acute or chronic hemorrhage. Normal white matter signal, parenchymal volume and CSF spaces. Normal midline structures. Vascular: Major flow voids are preserved. Skull and upper cervical spine: Normal calvarium and skull base. Visualized upper cervical spine and soft tissues are normal. Sinuses/Orbits:No paranasal sinus fluid levels or advanced mucosal thickening. No mastoid or middle ear effusion. Normal orbits. IMPRESSION: 1. Normal brain.  No subarachnoid hemorrhage.  Electronically Signed   By: Ulyses Jarred M.D.   On: 02/19/2021 02:12   NM Hepato W/EF  Result Date: 02/22/2021 CLINICAL DATA:  Nausea and vomiting. EXAM: NUCLEAR MEDICINE HEPATOBILIARY IMAGING WITH GALLBLADDER EF TECHNIQUE: Sequential images of the abdomen were obtained out to 60 minutes following intravenous administration of radiopharmaceutical. After oral ingestion of Ensure, gallbladder ejection fraction was determined. At 60 min, normal ejection fraction is greater than 33%. RADIOPHARMACEUTICALS:  5.5 mCi Tc-26m  Choletec IV COMPARISON:  CT February 18, 2021 FINDINGS: There is prompt, uniform radiotracer uptake by the  liver with normal filling of the intrahepatic ducts, common bile duct. Gallbladder activity is visualized, consistent with patency of cystic duct (normal < 60 minutes). Additionally there is normal biliary to bowel transit (normal < 60 minutes), consistent with patent common bile duct. Ensure was administered and the gallbladder appears to empty normally on sequential images. Calculated gallbladder ejection fraction is 35%. (Normal gallbladder ejection fraction with Ensure is greater than 33%.) No evidence of enterogastric biliary reflux. IMPRESSION: 1.  Patent cystic and common bile ducts. 2.  Normal gallbladder ejection fraction. Electronically Signed   By: Dahlia Bailiff MD   On: 02/22/2021 11:56   DG CHEST PORT 1 VIEW  Result Date: 02/21/2021 CLINICAL DATA:  Shortness of breath EXAM: PORTABLE CHEST 1 VIEW COMPARISON:  June 20th 2022 FINDINGS: The cardiomediastinal silhouette is unchanged and enlarged in contour.Perihilar vascular fullness. No pleural effusion. No pneumothorax. LEFT retrocardiac opacity most consistent with atelectasis. Visualized abdomen is unremarkable. No acute osseous abnormality. IMPRESSION: Cardiomegaly with persistent prominence of the pulmonary vasculature without overt edema. Electronically Signed   By: Valentino Saxon MD   On: 02/21/2021 11:31   DG Abd 2  Views  Result Date: 02/17/2021 CLINICAL DATA:  Abdominal pain and vomiting for several days EXAM: ABDOMEN - 2 VIEW COMPARISON:  02/12/2021 FINDINGS: Supine and decubitus views were obtained. Scattered large and small bowel gas is noted. No findings to suggest free intraperitoneal air are noted. No abnormal mass or abnormal calcifications are noted. No acute bony abnormality is seen. IMPRESSION: No acute abnormality noted. Electronically Signed   By: Inez Catalina M.D.   On: 02/17/2021 09:05   CT Renal Stone Study  Result Date: 02/12/2021 CLINICAL DATA:  Nausea and vomiting for 2 days, abdominal pain EXAM: CT ABDOMEN AND PELVIS WITHOUT CONTRAST TECHNIQUE: Multidetector CT imaging of the abdomen and pelvis was performed following the standard protocol without IV contrast. Unenhanced CT was performed per clinician order. Lack of IV contrast limits sensitivity and specificity, especially for evaluation of abdominal/pelvic solid viscera. COMPARISON:  01/29/2020 FINDINGS: Lower chest: No acute pleural or parenchymal lung disease. Minimal left lower lobe parenchymal lung scarring. Hepatobiliary: No focal liver abnormality is seen. No gallstones, gallbladder wall thickening, or biliary dilatation. Pancreas: Unremarkable. No pancreatic ductal dilatation or surrounding inflammatory changes. Spleen: Normal in size without focal abnormality. Adrenals/Urinary Tract: No urinary tract calculi or obstructive uropathy within either kidney. The adrenals and bladder are grossly normal. Stomach/Bowel: No bowel obstruction or ileus. Normal appendix right lower quadrant. No bowel wall thickening or inflammatory change. Vascular/Lymphatic: No significant vascular findings are present. No enlarged abdominal or pelvic lymph nodes. Reproductive: Uterus and bilateral adnexa are unremarkable. Other: No free fluid or free gas.  No abdominal wall hernia. Musculoskeletal: No acute or destructive bony lesions. Reconstructed images demonstrate  no additional findings. IMPRESSION: 1. No evidence of urinary tract calculi or obstructive uropathy. 2. Unremarkable unenhanced CT of the abdomen and pelvis. Electronically Signed   By: Randa Ngo M.D.   On: 02/12/2021 19:27   CT HEAD CODE STROKE WO CONTRAST  Result Date: 02/18/2021 CLINICAL DATA:  Code stroke.  Neuro deficit, acute stroke suspected. EXAM: CT HEAD WITHOUT CONTRAST TECHNIQUE: Contiguous axial images were obtained from the base of the skull through the vertex without intravenous contrast. COMPARISON:  CT head 04/22/2016 FINDINGS: Brain: No evidence of acute large vascular territory infarction, hemorrhage, hydrocephalus, extra-axial collection or mass lesion/mass effect. Partially empty sella. Vascular: No hyperdense vessel identified. Skull: No acute fracture. Sinuses/Orbits: Polyps or  retention cysts in bilateral maxillary sinuses. Opacified posterior right ethmoid air cell. Other: No mastoid effusions. ASPECTS [facility] Stroke Program Early CT Score) total score (0-10 with 10 being normal): 10. IMPRESSION: 1. No evidence of acute large vascular territory infarct or acute hemorrhage. ASPECTS is 10. 2. Partially empty sella, which is often a normal anatomic variant but can be associated with idiopathic intracranial hypertension (pseudotumor cerebri). Code stroke imaging results were communicated on 02/18/2021 at 7:07 pm to provider Dr. Quinn Axe via [facility] text paging. Electronically Signed   By: Margaretha Sheffield MD   On: 02/18/2021 19:09   CT ANGIO HEAD NECK W WO CM (CODE STROKE)  Result Date: 02/18/2021 CLINICAL DATA:  Neurological deficit, acute, stroke suspected. EXAM: CT ANGIOGRAPHY HEAD AND NECK TECHNIQUE: Multidetector CT imaging of the head and neck was performed using the standard protocol during bolus administration of intravenous contrast. Multiplanar CT image reconstructions and MIPs were obtained to evaluate the vascular anatomy. Carotid stenosis measurements (when applicable) are  obtained utilizing NASCET criteria, using the distal internal carotid diameter as the denominator. CONTRAST:  21m OMNIPAQUE IOHEXOL 350 MG/ML SOLN COMPARISON:  Head CT earlier same day. FINDINGS: CTA NECK FINDINGS Aortic arch: The arch does not show any atherosclerotic change. Branching pattern is normal. Right carotid system: Common carotid artery widely patent to the bifurcation. The carotid bifurcation is normal. Cervical ICA is normal. Left carotid system: Common carotid artery widely patent to the bifurcation. The bifurcation is normal. Cervical ICA is normal. Vertebral arteries: Both vertebral arteries are patent at their origins. These are small vessels. The degree of opacity is low and proximal vertebral arteries are poorly seen. In the upper cervical region, small vertebral arteries do appear to show flow to the foramen magnum. Skeleton: Mid cervical spondylosis. Other neck: No mass or lymphadenopathy. Upper chest: Normal Review of the MIP images confirms the above findings CTA HEAD FINDINGS Anterior circulation: Both internal carotid arteries are patent through the skull base and siphon regions. The anterior and middle cerebral vessels show flow. There is a persistent trigeminal artery on the right which gives the majority of the flow to the posterior circulation. Posterior circulation: Diminutive vertebral arteries are patent through the foramen magnum the proximal basilar. Persistent trigeminal artery on the right gives the majority of the supply to the posterior circulation. Distal basilar artery appears normal. Superior cerebellar and posterior cerebral arteries are normal. Venous sinuses: Patent and normal. Anatomic variants: None other significant.  See above. Review of the MIP images confirms the above findings IMPRESSION: No intracranial large vessel occlusion. No atherosclerotic vascular disease is demonstrated in the neck or in the head. 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 The vertebral arteries are quite diminutive on a congenital basis. Electronically Signed   By: MNelson ChimesM.D.   On: 02/18/2021 19:34   EGD   Subjective: SLIGHTLY NAUSEATED, no vomiting. Wants to go home.   Discharge Exam: Vitals:   02/25/21 0434 02/25/21 0921  BP: 140/66 134/76  Pulse: 75 65  Resp: 17 16  Temp: 98.4 F (36.9 C) 98.3 F (36.8 C)  SpO2: 94% 95%   Vitals:   02/24/21 1837 02/24/21 2043 02/25/21 0434 02/25/21 0921  BP: (!) 112/58 (!) 97/49 140/66 134/76  Pulse: 77 79 75 65  Resp: _0 Temp: 98.7 F (37.1 C) 98.2 F (36.8 C) 98.4  F (36.9 C) 98.3 F (36.8 C)  TempSrc: Oral Oral Oral Oral  SpO2: 93% 97% 94% 95%  Weight:        General: Pt is alert, awake, not in acute distress Cardiovascular: RRR, S1/S2 +, no rubs, no gallops Respiratory: CTA bilaterally, no wheezing, no rhonchi Abdominal: Soft, NT, ND, bowel sounds + Extremities: no edema, no cyanosis    The results of significant diagnostics from this hospitalization (including imaging, microbiology, ancillary and laboratory) are listed below for reference.     Microbiology: Recent Results (from the past 240 hour(s))  SARS CORONAVIRUS 2 (TAT 6-24 HRS) Nasopharyngeal Nasopharyngeal Swab     Status: None   Collection Time: 02/19/21  3:20 AM   Specimen: Nasopharyngeal Swab  Result Value Ref Range Status   SARS Coronavirus 2 NEGATIVE NEGATIVE Final    Comment: (NOTE) SARS-CoV-2 target nucleic acids are NOT DETECTED.  The SARS-CoV-2 RNA is generally detectable in upper and lower respiratory specimens during the acute phase of infection. Negative results do not preclude SARS-CoV-2 infection, do not rule out co-infections with other pathogens, and should not be used as the sole basis for treatment or other patient management decisions. Negative results must be  combined with clinical observations, patient history, and epidemiological information. The expected result is Negative.  Fact Sheet for Patients: SugarRoll.be  Fact Sheet for Healthcare Providers: https://www.woods-mathews.com/  This test is not yet approved or cleared by the [place] FDA and  has been authorized for detection and/or diagnosis of SARS-CoV-2 by FDA under an Emergency Use Authorization (EUA). This EUA will remain  in effect (meaning this test can be used) for the duration of the COVID-19 declaration under Se ction 564(b)(1) of the Act, 21 U.S.C. section 360bbb-3(b)(1), unless the authorization is terminated or revoked sooner.  Performed at [facility], [place] [address], [place], [place] [address]   Culture, blood (routine x 2)     Status: Abnormal   Collection Time: 02/20/21 10:50 PM   Specimen: BLOOD RIGHT HAND  Result Value Ref Range Status   Specimen Description BLOOD RIGHT HAND  Final   Special Requests   Final    BOTTLES DRAWN AEROBIC ONLY Blood Culture adequate volume   Culture  Setup Time   Final    AEROBIC BOTTLE ONLY GRAM POSITIVE COCCI IN CLUSTERS CRITICAL RESULT CALLED TO, READ BACK BY AND VERIFIED WITH: L SEAY PHARMD 02/21/21 2234 JDW    Culture (A)  Final    STAPHYLOCOCCUS HOMINIS THE SIGNIFICANCE OF ISOLATING THIS ORGANISM FROM A SINGLE SET OF BLOOD CULTURES WHEN MULTIPLE SETS ARE DRAWN IS UNCERTAIN. PLEASE NOTIFY THE MICROBIOLOGY DEPARTMENT WITHIN ONE WEEK IF SPECIATION AND SENSITIVITIES ARE REQUIRED. Performed at [facility], [place] [address], [place], [place] [address]    Report Status 02/24/2021 FINAL  Final  Blood Culture ID Panel (Reflexed)     Status: Abnormal   Collection Time: 02/20/21 10:50 PM  Result Value Ref Range Status   Enterococcus faecalis NOT DETECTED NOT DETECTED Final   Enterococcus Faecium NOT DETECTED NOT DETECTED Final   Listeria monocytogenes NOT DETECTED NOT DETECTED  Final   Staphylococcus species DETECTED (A) NOT DETECTED Final    Comment: CRITICAL RESULT CALLED TO, READ BACK BY AND VERIFIED WITH: L SEAY PHARMD 02/21/21 2234 JDW    Staphylococcus aureus (BCID) NOT DETECTED NOT DETECTED Final   Staphylococcus epidermidis NOT DETECTED NOT DETECTED Final   Staphylococcus lugdunensis NOT DETECTED NOT DETECTED Final   Streptococcus species NOT  DETECTED NOT DETECTED Final   Streptococcus agalactiae NOT DETECTED NOT DETECTED Final   Streptococcus pneumoniae NOT DETECTED NOT DETECTED Final   Streptococcus pyogenes NOT DETECTED NOT DETECTED Final   A.calcoaceticus-baumannii NOT DETECTED NOT DETECTED Final   Bacteroides fragilis NOT DETECTED NOT DETECTED Final   Enterobacterales NOT DETECTED NOT DETECTED Final   Enterobacter cloacae complex NOT DETECTED NOT DETECTED Final   Escherichia coli NOT DETECTED NOT DETECTED Final   Klebsiella aerogenes NOT DETECTED NOT DETECTED Final   Klebsiella oxytoca NOT DETECTED NOT DETECTED Final   Klebsiella pneumoniae NOT DETECTED NOT DETECTED Final   Proteus species NOT DETECTED NOT DETECTED Final   Salmonella species NOT DETECTED NOT DETECTED Final   Serratia marcescens NOT DETECTED NOT DETECTED Final   Haemophilus influenzae NOT DETECTED NOT DETECTED Final   Neisseria meningitidis NOT DETECTED NOT DETECTED Final   Pseudomonas aeruginosa NOT DETECTED NOT DETECTED Final   Stenotrophomonas maltophilia NOT DETECTED NOT DETECTED Final   Candida albicans NOT DETECTED NOT DETECTED Final   Candida auris NOT DETECTED NOT DETECTED Final   Candida glabrata NOT DETECTED NOT DETECTED Final   Candida krusei NOT DETECTED NOT DETECTED Final   Candida parapsilosis NOT DETECTED NOT DETECTED Final   Candida tropicalis NOT DETECTED NOT DETECTED Final   Cryptococcus neoformans/gattii NOT DETECTED NOT DETECTED Final    Comment: Performed at [facility], [address], [place], [place] [address]  Culture, blood (routine x 2)      Status: Abnormal   Collection Time: 02/20/21 11:30 PM   Specimen: BLOOD RIGHT HAND  Result Value Ref Range Status   Specimen Description BLOOD RIGHT HAND  Final   Special Requests   Final    BOTTLES DRAWN AEROBIC ONLY Blood Culture results may not be optimal due to an inadequate volume of blood received in culture bottles   Culture  Setup Time   Final    AEROBIC BOTTLE ONLY GRAM POSITIVE COCCI IN CLUSTERS CRITICAL VALUE NOTED.  VALUE IS CONSISTENT WITH PREVIOUSLY REPORTED AND CALLED VALUE.    Culture (A)  Final    STAPHYLOCOCCUS CAPITIS THE SIGNIFICANCE OF ISOLATING THIS ORGANISM FROM A SINGLE SET OF BLOOD CULTURES WHEN MULTIPLE SETS ARE DRAWN IS UNCERTAIN. PLEASE NOTIFY THE MICROBIOLOGY DEPARTMENT WITHIN ONE WEEK IF SPECIATION AND SENSITIVITIES ARE REQUIRED. Performed at [facility], [place] [address] [place], [place] [address]    Report Status 02/25/2021 FINAL  Final  Urine Culture     Status: Abnormal   Collection Time: 02/21/21  5:21 PM   Specimen: Urine, Clean Catch  Result Value Ref Range Status   Specimen Description URINE, CLEAN CATCH  Final   Special Requests   Final    Immunocompromised Performed at [facility], [place] [address], [place], [place] [address]    Culture MULTIPLE SPECIES PRESENT, SUGGEST RECOLLECTION (A)  Final   Report Status 02/22/2021 FINAL  Final     Labs: BNP (last 3 results) No results for input(s): BNP in the last 8760 hours. Basic Metabolic Panel: Recent Labs  Lab 02/20/21 1000 02/20/21 2319 02/22/21 0412 02/23/21 0332 02/24/21 0334 02/25/21 1109  NA 139 138 136 135 136 139  K 3.1* 3.4* 3.7 3.8 3.2* 3.7  CL 109 107 105 105 106 108  CO2 24 17* 20* _0 GLUCOSE 189* 154* 163* 202* 251* 237*  BUN _1 5* 8  CREATININE 0.85 0.93 0.82 0.88 1.07* 0.99  CALCIUM 9.0 9.2 8.5*  8.4* 8.6* 8.7*  MG 1.7 1.9  --   --   --   --    Liver Function Tests: No results for input(s): AST, ALT, ALKPHOS, BILITOT, PROT, ALBUMIN in the last  168 hours. No results for input(s): LIPASE, AMYLASE in the last 168 hours. No results for input(s): AMMONIA in the last 168 hours. CBC: Recent Labs  Lab 02/20/21 2319 02/24/21 0334  WBC 10.1 6.3  HGB 10.9* 10.8*  HCT 33.6* 33.7*  MCV 86.8 88.2  PLT PLATELET CLUMPS NOTED ON SMEAR, UNABLE TO ESTIMATE 195   Cardiac Enzymes: No results for input(s): CKTOTAL, CKMB, CKMBINDEX, TROPONINI in the last 168 hours. BNP: Invalid input(s): POCBNP CBG: Recent Labs  Lab 02/24/21 1121 02/24/21 1638 02/24/21 2043 02/25/21 0724 02/25/21 1138  GLUCAP 266* 321* 305* 222* 228*   D-Dimer No results for input(s): DDIMER in the last 72 hours. Hgb A1c Recent Labs    02/24/21 0406  HGBA1C 9.9*   Lipid Profile No results for input(s): CHOL, HDL, LDLCALC, TRIG, CHOLHDL, LDLDIRECT in the last 72 hours. Thyroid function studies No results for input(s): TSH, T4TOTAL, T3FREE, THYROIDAB in the last 72 hours.  Invalid input(s): FREET3 Anemia work up No results for input(s): VITAMINB12, FOLATE, FERRITIN, TIBC, IRON, RETICCTPCT in the last 72 hours. Urinalysis    Component Value Date/Time   COLORURINE AMBER (A) 02/21/2021 1721   APPEARANCEUR TURBID (A) 02/21/2021 1721   LABSPEC 1.035 (H) 02/21/2021 1721   PHURINE 5.0 02/21/2021 1721   GLUCOSEU 150 (A) 02/21/2021 1721   HGBUR SMALL (A) 02/21/2021 1721   BILIRUBINUR NEGATIVE 02/21/2021 1721   KETONESUR 5 (A) 02/21/2021 1721   PROTEINUR 30 (A) 02/21/2021 1721   UROBILINOGEN 0.2 02/27/2015 1733   NITRITE NEGATIVE 02/21/2021 1721   LEUKOCYTESUR NEGATIVE 02/21/2021 1721   Sepsis Labs Invalid input(s): PROCALCITONIN,  WBC,  LACTICIDVEN Microbiology Recent Results (from the past 240 hour(s))  SARS CORONAVIRUS 2 (TAT 6-24 HRS) Nasopharyngeal Nasopharyngeal Swab     Status: None   Collection Time: 02/19/21  3:20 AM   Specimen: Nasopharyngeal Swab  Result Value Ref Range Status   SARS Coronavirus 2 NEGATIVE NEGATIVE Final    Comment:  (NOTE) SARS-CoV-2 target nucleic acids are NOT DETECTED.  The SARS-CoV-2 RNA is generally detectable in upper and lower respiratory specimens during the acute phase of infection. Negative results do not preclude SARS-CoV-2 infection, do not rule out co-infections with other pathogens, and should not be used as the sole basis for treatment or other patient management decisions. Negative results must be combined with clinical observations, patient history, and epidemiological information. The expected result is Negative.  Fact Sheet for Patients: SugarRoll.be  Fact Sheet for Healthcare Providers: https://www.woods-mathews.com/  This test is not yet approved or cleared by the [place] FDA and  has been authorized for detection and/or diagnosis of SARS-CoV-2 by FDA under an Emergency Use Authorization (EUA). This EUA will remain  in effect (meaning this test can be used) for the duration of the COVID-19 declaration under Se ction 564(b)(1) of the Act, 21 U.S.C. section 360bbb-3(b)(1), unless the authorization is terminated or revoked sooner.  Performed at [facility], [place] [address], [place], [place] [address]   Culture, blood (routine x 2)     Status: Abnormal   Collection Time: 02/20/21 10:50 PM   Specimen: BLOOD RIGHT HAND  Result Value Ref Range Status   Specimen Description BLOOD RIGHT HAND  Final   Special Requests   Final  BOTTLES DRAWN AEROBIC ONLY Blood Culture adequate volume   Culture  Setup Time   Final    AEROBIC BOTTLE ONLY GRAM POSITIVE COCCI IN CLUSTERS CRITICAL RESULT CALLED TO, READ BACK BY AND VERIFIED WITH: L SEAY PHARMD 02/21/21 2234 JDW    Culture (A)  Final    STAPHYLOCOCCUS HOMINIS THE SIGNIFICANCE OF ISOLATING THIS ORGANISM FROM A SINGLE SET OF BLOOD CULTURES WHEN MULTIPLE SETS ARE DRAWN IS UNCERTAIN. PLEASE NOTIFY THE MICROBIOLOGY DEPARTMENT WITHIN ONE WEEK IF SPECIATION AND SENSITIVITIES ARE  REQUIRED. Performed at [facility], [place] [address], [place], [place] [address]    Report Status 02/24/2021 FINAL  Final  Blood Culture ID Panel (Reflexed)     Status: Abnormal   Collection Time: 02/20/21 10:50 PM  Result Value Ref Range Status   Enterococcus faecalis NOT DETECTED NOT DETECTED Final   Enterococcus Faecium NOT DETECTED NOT DETECTED Final   Listeria monocytogenes NOT DETECTED NOT DETECTED Final   Staphylococcus species DETECTED (A) NOT DETECTED Final    Comment: CRITICAL RESULT CALLED TO, READ BACK BY AND VERIFIED WITH: L SEAY PHARMD 02/21/21 2234 JDW    Staphylococcus aureus (BCID) NOT DETECTED NOT DETECTED Final   Staphylococcus epidermidis NOT DETECTED NOT DETECTED Final   Staphylococcus lugdunensis NOT DETECTED NOT DETECTED Final   Streptococcus species NOT DETECTED NOT DETECTED Final   Streptococcus agalactiae NOT DETECTED NOT DETECTED Final   Streptococcus pneumoniae NOT DETECTED NOT DETECTED Final   Streptococcus pyogenes NOT DETECTED NOT DETECTED Final   A.calcoaceticus-baumannii NOT DETECTED NOT DETECTED Final   Bacteroides fragilis NOT DETECTED NOT DETECTED Final   Enterobacterales NOT DETECTED NOT DETECTED Final   Enterobacter cloacae complex NOT DETECTED NOT DETECTED Final   Escherichia coli NOT DETECTED NOT DETECTED Final   Klebsiella aerogenes NOT DETECTED NOT DETECTED Final   Klebsiella oxytoca NOT DETECTED NOT DETECTED Final   Klebsiella pneumoniae NOT DETECTED NOT DETECTED Final   Proteus species NOT DETECTED NOT DETECTED Final   Salmonella species NOT DETECTED NOT DETECTED Final   Serratia marcescens NOT DETECTED NOT DETECTED Final   Haemophilus influenzae NOT DETECTED NOT DETECTED Final   Neisseria meningitidis NOT DETECTED NOT DETECTED Final   Pseudomonas aeruginosa NOT DETECTED NOT DETECTED Final   Stenotrophomonas maltophilia NOT DETECTED NOT DETECTED Final   Candida albicans NOT DETECTED NOT DETECTED Final   Candida auris NOT DETECTED  NOT DETECTED Final   Candida glabrata NOT DETECTED NOT DETECTED Final   Candida krusei NOT DETECTED NOT DETECTED Final   Candida parapsilosis NOT DETECTED NOT DETECTED Final   Candida tropicalis NOT DETECTED NOT DETECTED Final   Cryptococcus neoformans/gattii NOT DETECTED NOT DETECTED Final    Comment: Performed at [facility], [address], [place], [place] [address]  Culture, blood (routine x 2)     Status: Abnormal   Collection Time: 02/20/21 11:30 PM   Specimen: BLOOD RIGHT HAND  Result Value Ref Range Status   Specimen Description BLOOD RIGHT HAND  Final   Special Requests   Final    BOTTLES DRAWN AEROBIC ONLY Blood Culture results may not be optimal due to an inadequate volume of blood received in culture bottles   Culture  Setup Time   Final    AEROBIC BOTTLE ONLY GRAM POSITIVE COCCI IN CLUSTERS CRITICAL VALUE NOTED.  VALUE IS CONSISTENT WITH PREVIOUSLY REPORTED AND CALLED VALUE.    Culture (A)  Final    STAPHYLOCOCCUS CAPITIS THE SIGNIFICANCE OF ISOLATING THIS ORGANISM FROM A SINGLE  SET OF BLOOD CULTURES WHEN MULTIPLE SETS ARE DRAWN IS UNCERTAIN. PLEASE NOTIFY THE MICROBIOLOGY DEPARTMENT WITHIN ONE WEEK IF SPECIATION AND SENSITIVITIES ARE REQUIRED. Performed at [facility], [place] [address], [place], [place] [address]    Report Status 02/25/2021 FINAL  Final  Urine Culture     Status: Abnormal   Collection Time: 02/21/21  5:21 PM   Specimen: Urine, Clean Catch  Result Value Ref Range Status   Specimen Description URINE, CLEAN CATCH  Final   Special Requests   Final    Immunocompromised Performed at [facility], [place] [address] [place], [place] [address]    Culture MULTIPLE SPECIES PRESENT, SUGGEST RECOLLECTION (A)  Final   Report Status 02/22/2021 FINAL  Final     Time coordinating discharge:36 minutes.   SIGNED:   Hosie Poisson, MD  [facility]

## 2021-03-17 ENCOUNTER — Ambulatory Visit (INDEPENDENT_AMBULATORY_CARE_PROVIDER_SITE_OTHER): Payer: Medicaid Other | Admitting: Endocrinology

## 2021-03-17 ENCOUNTER — Other Ambulatory Visit: Payer: Self-pay

## 2021-03-17 ENCOUNTER — Other Ambulatory Visit: Payer: Self-pay | Admitting: Endocrinology

## 2021-03-17 VITALS — BP 170/100 | HR 110 | Ht 68.0 in | Wt 351.8 lb

## 2021-03-17 DIAGNOSIS — E1143 Type 2 diabetes mellitus with diabetic autonomic (poly)neuropathy: Secondary | ICD-10-CM

## 2021-03-17 DIAGNOSIS — E1169 Type 2 diabetes mellitus with other specified complication: Secondary | ICD-10-CM | POA: Diagnosis not present

## 2021-03-17 DIAGNOSIS — E785 Hyperlipidemia, unspecified: Secondary | ICD-10-CM | POA: Diagnosis not present

## 2021-03-17 DIAGNOSIS — Z794 Long term (current) use of insulin: Secondary | ICD-10-CM

## 2021-03-17 LAB — POCT GLYCOSYLATED HEMOGLOBIN (HGB A1C): Hemoglobin A1C: 9.4 % — AB (ref 4.0–5.6)

## 2021-03-17 MED ORDER — HUMULIN N KWIKPEN 100 UNIT/ML ~~LOC~~ SUPN: 260.0000 [IU] | PEN_INJECTOR | SUBCUTANEOUS | 3 refills | Status: DC

## 2021-03-17 NOTE — Patient Instructions (Addendum)
check your blood sugar 4 times a day: before the 3 meals, and at bedtime.  also check if you have symptoms of your blood sugar being too high or too low.  please keep a record of the readings and bring it to your next appointment here (or you can bring the meter itself).  You can write it on any piece of paper.  please call us sooner if your blood sugar goes below 70, or if you have a lot of readings over 200, especially if you have to increase the prednisone again.   I have sent a prescription to your pharmacy, to increase the insulin to 260 units each morning.  continue the same other medications for now.  On this type of insulin schedule, you should eat meals on a regular schedule (especially lunch).  If a meal is missed or significantly delayed, your blood sugar could go low.   Please come back for a follow-up appointment in 2 months.

## 2021-03-17 NOTE — Progress Notes (Signed)
Subjective:    Patient ID: [name], female    DOB: November 29, 1970, 50 y.o.   MRN: [id number]  HPI Pt returns for f/u of diabetes mellitus: DM type: Insulin-requiring type 2 Dx'ed: 7829 Complications: polyneuropathy and gastroparesis.  Therapy: insulin (since 2014), Victoza, and metformin.   GDM: never.  DKA: never.  Severe hypoglycemia: never.  Pancreatitis: never.  Other: she is too ill to undergo weight-loss surgery; she takes qd insulin, after poor results with multiple daily injections; she takes NPH, due to pattern of cbg's.   Interval history: pt states she feels well in general.  She says cbg varies from 279-352.  She takes 3 DM meds as rx'ed.   Past Medical History:  Diagnosis Date   Acute renal failure (HCC)     Intractable nausea vomiting secondary to diabetic gastroparesis causing dehydration and acute renal failure /notes 04/01/2013   Anginal pain ([place])    Anxiety    Bell's palsy 08/09/2010   Bicornuate uterus    CAP (community acquired pneumonia) 10/2011   Archie Endo 11/08/2011   Chest pain 01/13/2014   CHF (congestive heart failure) ([place])    Diabetic gastroparesis associated with type 2 diabetes mellitus ([place])    this is presumed diagnoses, not confirmed by any studies.    Eczema    Family history of malignant neoplasm of breast    Family history of malignant neoplasm of ovary    GERD (gastroesophageal reflux disease)    High cholesterol    Hypertension    Liver lesion 08/13/2019   Liver mass 2014   biopsied 03/2013 at [facility], not malignant.  is to undergo radiologic ablation of the mass in sept/October 2014.    Lupus ([place])    Migraines    "maybe a couple times/yr" (04/01/2013)   Obesity    Obstructive sleep apnea on CPAP 2011   Oxygen at nights   SJOGREN'S SYNDROME 08/09/2010   SLE (systemic lupus erythematosus) ([place])    Archie Endo 12/01/2010   Stroke ([place]) 2010; 10/2012   "left side is still weak from it, never fully regained full strength; no additions from  stroke 10/2012"    Past Surgical History:  Procedure Laterality Date   BREAST CYST EXCISION Left 08/2005   epidermoid   CARDIAC CATHETERIZATION  02/07/12   ECTOPIC PREGNANCY SURGERY  1999   ECTOPIC PREGNANCY SURGERY  1999   ESOPHAGOGASTRODUODENOSCOPY N/A 02/26/2013   Procedure: ESOPHAGOGASTRODUODENOSCOPY (EGD);  Surgeon: Irene Shipper, MD;  Location: [facility] ENDOSCOPY;  Service: Endoscopy;  Laterality: N/A;   ESOPHAGOGASTRODUODENOSCOPY N/A 03/02/2015   Procedure: ESOPHAGOGASTRODUODENOSCOPY (EGD);  Surgeon: Milus Banister, MD;  Location: [place];  Service: Endoscopy;  Laterality: N/A;   ESOPHAGOGASTRODUODENOSCOPY N/A 02/23/2021   Procedure: ESOPHAGOGASTRODUODENOSCOPY (EGD);  Surgeon: Wilford Corner, MD;  Location: [place];  Service: Endoscopy;  Laterality: N/A;  possible dilation   HERNIA REPAIR     LEFT AND RIGHT HEART CATHETERIZATION WITH CORONARY ANGIOGRAM N/A 02/07/2012   Procedure: LEFT AND RIGHT HEART CATHETERIZATION WITH CORONARY ANGIOGRAM;  Surgeon: Laverda Page, MD;  Location: [facility] CATH LAB;  Service: Cardiovascular;  Laterality: N/A;   LIVER BIOPSY  03/2013   liver mass/medical hx noted above   MUSCLE BIOPSY     for lupus/notes 12/04/2128   UMBILICAL HERNIA REPAIR  1980's    Social History   Socioeconomic History   Marital status: Single    Spouse name: Not on file   Number of children: 1   Years of education: Not on  file   Highest education level: Not on file  Occupational History    Employer: UNEMPLOYED    Comment: student  Tobacco Use   Smoking status: Former    Packs/day: 0.50    Years: 3.00    Pack years: 1.50    Types: Cigarettes    Quit date: 06/02/1991    Years since quitting: 29.8   Smokeless tobacco: Never   Tobacco comments:    quit 28 yrs ago  Vaping Use   Vaping Use: Never used  Substance and Sexual Activity   Alcohol use: No    Alcohol/week: 0.0 standard drinks   Drug use: No   Sexual activity: Yes    Partners: Male    Birth  control/protection: None  Other Topics Concern   Not on file  Social History Narrative   Regular exercise-yes   Social Determinants of Health   Financial Resource Strain: Not on file  Food Insecurity: Not on file  Transportation Needs: Not on file  Physical Activity: Not on file  Stress: Not on file  Social Connections: Not on file  Intimate Partner Violence: Not on file    Current Outpatient Medications on File Prior to Visit  Medication Sig Dispense Refill   ADVAIR HFA 230-21 MCG/ACT inhaler Inhale 1-2 puffs into the lungs 2 (two) times daily.     albuterol (PROVENTIL) (2.5 MG/3ML) 0.083% nebulizer solution Take 2.5 mg by nebulization every 6 (six) hours as needed for wheezing.     ALPRAZolam (XANAX) 0.5 MG tablet Take 0.5 mg 2 (two) times daily as needed by mouth for anxiety or sleep.      amLODipine (NORVASC) 10 MG tablet Take 1 tablet (10 mg total) by mouth daily. 90 tablet 3   ARIPiprazole (ABILIFY) 5 MG tablet Take 5 mg by mouth daily.     aspirin EC 81 MG tablet Take 81 mg by mouth daily. Swallow whole.     Azathioprine 75 MG TABS Take 150 mg by mouth 2 (two) times daily.     B-D UF III MINI PEN NEEDLES 31G X 5 MM MISC USE AS DIRECTED TWICE A DAY 100 each 2   Belimumab (BENLYSTA) 200 MG/ML SOAJ Inject 200 mg into the skin once a week. Thursdays     benzonatate (TESSALON) 100 MG capsule Take 1 capsule by mouth 3 (three) times daily as needed for cough.     Blood Glucose Monitoring Suppl (ONETOUCH VERIO) w/Device KIT Test 4 times daily.  DX E11.9 1 kit 1   buPROPion (WELLBUTRIN XL) 300 MG 24 hr tablet Take 300 mg by mouth daily.     carvedilol (COREG) 25 MG tablet Take 1.5 tablets (37.5 mg total) by mouth 2 (two) times daily with a meal. 270 tablet 3   cetirizine (ZYRTEC) 10 MG tablet Take 10 mg by mouth daily as needed for allergies.      clotrimazole-betamethasone (LOTRISONE) cream Apply 1 application topically 2 (two) times daily. 45 g 3   cromolyn (OPTICROM) 4 % ophthalmic  solution Place 1 drop into both eyes 2 (two) times daily.     cycloSPORINE (RESTASIS) 0.05 % ophthalmic emulsion Place 1 drop into both eyes in the morning, at noon, and at bedtime.      diclofenac Sodium (VOLTAREN) 1 % GEL Apply 2 g topically 4 (four) times daily. 350 g 0   DULoxetine (CYMBALTA) 60 MG capsule Take 120 mg by mouth at bedtime.     EASY TOUCH PEN NEEDLES 32G X 4 MM  MISC USE TO INJECT INSULIN TWICE DAILY 100 each 9   famotidine (PEPCID) 20 MG tablet Take 20 mg by mouth 2 (two) times daily.     fluticasone (FLONASE) 50 MCG/ACT nasal spray Place 2 sprays into both nostrils continuous as needed for allergies or rhinitis.     glucose blood (ONETOUCH VERIO) test strip Test 4 times daily DX E11.9 100 each 12   glucose blood test strip Test 4 times daily DX E11.9 100 each 12   hydroxychloroquine (PLAQUENIL) 200 MG tablet Take 200 mg by mouth 2 (two) times daily.     Insulin Syringe-Needle U-100 (EASY TOUCH INSULIN SYRINGE) 31G X 5/16" 1 ML MISC USE AS DIRECTED THREE TIMES A DAY 100 each 10   isosorbide-hydrALAZINE (BIDIL) 20-37.5 MG tablet TAKE TWO (2) TABLETS BY MOUTH THREE TIMES DAILY 270 tablet 3   Lancets (ONETOUCH ULTRASOFT) lancets Test 4 times daily DX E11.9 100 each 12   levothyroxine (SYNTHROID) 50 MCG tablet Take 1 tablet (50 mcg total) by mouth daily at 6 (six) AM.     liraglutide (VICTOZA) 18 MG/3ML SOPN Inject 1.8 mg into the skin every morning. 9 mL 3   megestrol (MEGACE) 40 MG tablet Take 40 mg by mouth daily.  3   metFORMIN (GLUCOPHAGE-XR) 500 MG 24 hr tablet Take 2 tablets (1,000 mg total) by mouth 2 (two) times daily. 180 tablet 3   Multiple Vitamin (MULTIVITAMIN WITH MINERALS) TABS tablet Take 1 tablet by mouth daily.     ondansetron (ZOFRAN ODT) 4 MG disintegrating tablet Take 1 tablet (4 mg total) by mouth every 8 (eight) hours as needed for nausea or vomiting. 20 tablet 0   oxybutynin (DITROPAN XL) 15 MG 24 hr tablet Take 15 mg by mouth daily.      pantoprazole  (PROTONIX) 40 MG tablet Take 1 tablet (40 mg total) by mouth 2 (two) times daily. 60 tablet 1   polyethylene glycol (MIRALAX / GLYCOLAX) 17 g packet Take 17 g by mouth daily. 14 each 0   pravastatin (PRAVACHOL) 10 MG tablet Take 1 tablet (10 mg total) by mouth daily after supper. 90 tablet 3   predniSONE (DELTASONE) 1 MG tablet Take 1 mg by mouth 2 (two) times daily.     pregabalin (LYRICA) 75 MG capsule Take 75 mg 3 (three) times daily by mouth.      QUEtiapine (SEROQUEL) 300 MG tablet Take 300 mg by mouth at bedtime.     sacubitril-valsartan (ENTRESTO) 97-103 MG Take 1 tablet by mouth 2 (two) times daily. 180 tablet 3   spironolactone (ALDACTONE) 50 MG tablet Take 1 tablet (50 mg total) by mouth daily. 90 tablet 3   sucralfate (CARAFATE) 1 GM/10ML suspension Take 10 mLs by mouth 2 (two) times daily.     topiramate (TOPAMAX) 25 MG tablet Take 75 mg by mouth at bedtime.     torsemide (DEMADEX) 20 MG tablet Take 20 mg by mouth daily as needed for fluid.     No current facility-administered medications on file prior to visit.    Allergies  Allergen Reactions   Metoclopramide Other (See Comments)    Developed restless leg, akathisia type limb movements.    Codeine Itching, Rash and Other (See Comments)   Hydrocodone Rash   Penicillins Itching    Tolerated Unasyn 02/01/2020   Percocet [Oxycodone-Acetaminophen] Hives and Rash    Tolerates dilaudid Tolerates acetaminophen     Family History  Problem Relation Age of Onset   Diabetes Mother  Asthma Mother    Urticaria Mother    Hypertension Mother    Breast cancer Maternal Aunt 63       currently 11   Stomach cancer Maternal Uncle        dx 47s; deceased   Prostate cancer Maternal Uncle        deceased 34s   Cancer Maternal Aunt        unk. primary   Cancer Maternal Aunt        unk. primary   Ovarian cancer Maternal Aunt        deceased 41s   Hypertension Other    Stroke Other    Arthritis Other    Ovarian cancer Sister 58        maternal half-sister; RAD51C positive   Cancer Paternal Aunt        unk. primary; deceased 3s   Prostate cancer Paternal Uncle        deceased 3s   Breast cancer Cousin        deceased 31s; daughter of mat uncle with stomach ca   Breast cancer Maternal Aunt        deceased 74s   Asthma Brother    Asthma Brother    Allergic rhinitis Neg Hx    Angioedema Neg Hx     BP (!) 170/100 (BP Location: Right Arm, Patient Position: Sitting, Cuff Size: Large)   Pulse (!) 110   Ht _0  (1.727 m)   Wt (!) 351 lb 12.8 oz (159.6 kg)   LMP 03/24/2017   SpO2 96%   BMI 53.49 kg/m    Review of Systems She denies hypoglycemia.      Objective:   Physical Exam Pulses: dorsalis pedis intact bilat.   MSK: no deformity of the feet.   CV: 1+ bilat leg edema.  Skin:  no ulcer on the feet, but the skin is dry.  normal color and temp on the feet.   Neuro: sensation is intact to touch on the feet.    Lab Results  Component Value Date   HGBA1C 9.4 (A) 03/17/2021      Assessment & Plan:  Insulin-requiring type 2 DM: uncontrolled.    Patient Instructions  check your blood sugar 4 times a day: before the 3 meals, and at bedtime.  also check if you have symptoms of your blood sugar being too high or too low.  please keep a record of the readings and bring it to your next appointment here (or you can bring the meter itself).  You can write it on any piece of paper.  please call us sooner if your blood sugar goes below 70, or if you have a lot of readings over 200, especially if you have to increase the prednisone again.   I have sent a prescription to your pharmacy, to increase the insulin to 260 units each morning.  continue the same other medications for now.  On this type of insulin schedule, you should eat meals on a regular schedule (especially lunch).  If a meal is missed or significantly delayed, your blood sugar could go low.   Please come back for a follow-up appointment in 2 months.

## 2021-03-21 ENCOUNTER — Other Ambulatory Visit: Payer: Self-pay | Admitting: Endocrinology

## 2021-03-21 MED ORDER — HUMULIN N KWIKPEN 100 UNIT/ML ~~LOC~~ SUPN: 260.0000 [IU] | PEN_INJECTOR | SUBCUTANEOUS | 3 refills | Status: DC

## 2021-03-25 ENCOUNTER — Other Ambulatory Visit: Payer: Self-pay | Admitting: Endocrinology

## 2021-03-25 ENCOUNTER — Telehealth: Payer: Self-pay | Admitting: Endocrinology

## 2021-03-25 DIAGNOSIS — E1143 Type 2 diabetes mellitus with diabetic autonomic (poly)neuropathy: Secondary | ICD-10-CM

## 2021-03-25 NOTE — Telephone Encounter (Signed)
Patient called to request an additional RX (in case [facility] is unable to provide the medication listed below) for the following medication (to the [facility] listed below):  MEDICATION: Insulin NPH, Human,, Isophane, (HUMULIN N KWIKPEN) 100 UNIT/ML Kiwkpen  PHARMACY:   [facility] - [address], [place] Phone:  [phone]  Fax:  [phone]      HAS THE PATIENT CONTACTED THEIR PHARMACY?  Yes-PHARM told Patient they have sent a request for the above  IS THIS A 90 DAY SUPPLY : Not sure  IS PATIENT OUT OF MEDICATION: No  IF NOT; HOW MUCH IS LEFT: Approx. 90 days  LAST APPOINTMENT DATE: '@8'$ /17/2022  NEXT APPOINTMENT DATE:'@10'$ /19/2022  DO WE HAVE YOUR PERMISSION TO LEAVE A DETAILED MESSAGE?:Yes  OTHER COMMENTS: Patient also stated that the [facility] listed above also sent a request for new RX for Accuchek Aviva Plus Test Strips   **Let patient know to contact pharmacy at the end of the day to make sure medication is ready. **  ** Please notify patient to allow 48-72 hours to process**  **Encourage patient to contact the pharmacy for refills or they can request refills through [facility]**

## 2021-03-30 ENCOUNTER — Other Ambulatory Visit: Payer: Self-pay

## 2021-03-30 DIAGNOSIS — E1143 Type 2 diabetes mellitus with diabetic autonomic (poly)neuropathy: Secondary | ICD-10-CM

## 2021-03-30 MED ORDER — HUMULIN N KWIKPEN 100 UNIT/ML ~~LOC~~ SUPN: 260.0000 [IU] | PEN_INJECTOR | SUBCUTANEOUS | 3 refills | Status: DC

## 2021-03-30 NOTE — Telephone Encounter (Signed)
Pt voiced that this is needing the medication sent to [facility] of [facility]. Pt would like a call from nurse regarding this.

## 2021-04-06 ENCOUNTER — Other Ambulatory Visit: Payer: Medicaid Other

## 2021-04-07 ENCOUNTER — Other Ambulatory Visit: Payer: Self-pay | Admitting: Cardiology

## 2021-04-07 ENCOUNTER — Other Ambulatory Visit: Payer: Self-pay | Admitting: Endocrinology

## 2021-04-07 DIAGNOSIS — I5032 Chronic diastolic (congestive) heart failure: Secondary | ICD-10-CM

## 2021-05-04 ENCOUNTER — Other Ambulatory Visit: Payer: Self-pay | Admitting: Endocrinology

## 2021-05-04 ENCOUNTER — Other Ambulatory Visit: Payer: Self-pay | Admitting: Cardiology

## 2021-05-04 DIAGNOSIS — I10 Essential (primary) hypertension: Secondary | ICD-10-CM

## 2021-05-04 DIAGNOSIS — I5032 Chronic diastolic (congestive) heart failure: Secondary | ICD-10-CM

## 2021-05-04 DIAGNOSIS — Z794 Long term (current) use of insulin: Secondary | ICD-10-CM

## 2021-05-04 DIAGNOSIS — E1143 Type 2 diabetes mellitus with diabetic autonomic (poly)neuropathy: Secondary | ICD-10-CM

## 2021-05-19 ENCOUNTER — Ambulatory Visit: Payer: Medicaid Other | Admitting: Endocrinology

## 2021-05-21 ENCOUNTER — Inpatient Hospital Stay (HOSPITAL_COMMUNITY): Payer: Medicaid Other

## 2021-05-21 ENCOUNTER — Emergency Department (HOSPITAL_COMMUNITY): Payer: Medicaid Other

## 2021-05-21 ENCOUNTER — Encounter (HOSPITAL_COMMUNITY): Payer: Self-pay

## 2021-05-21 ENCOUNTER — Inpatient Hospital Stay (HOSPITAL_COMMUNITY)
Admission: EM | Admit: 2021-05-21 | Discharge: 2021-05-27 | DRG: 175 | Disposition: A | Discharge status: Home Health | Payer: Medicaid Other | Attending: Internal Medicine | Admitting: Internal Medicine

## 2021-05-21 DIAGNOSIS — Z79890 Hormone replacement therapy (postmenopausal): Secondary | ICD-10-CM

## 2021-05-21 DIAGNOSIS — Z87891 Personal history of nicotine dependence: Secondary | ICD-10-CM

## 2021-05-21 DIAGNOSIS — Z800 Family history of malignant neoplasm of digestive organs: Secondary | ICD-10-CM

## 2021-05-21 DIAGNOSIS — Z8041 Family history of malignant neoplasm of ovary: Secondary | ICD-10-CM

## 2021-05-21 DIAGNOSIS — Z885 Allergy status to narcotic agent status: Secondary | ICD-10-CM

## 2021-05-21 DIAGNOSIS — Z825 Family history of asthma and other chronic lower respiratory diseases: Secondary | ICD-10-CM | POA: Diagnosis not present

## 2021-05-21 DIAGNOSIS — N189 Chronic kidney disease, unspecified: Secondary | ICD-10-CM | POA: Diagnosis present

## 2021-05-21 DIAGNOSIS — Z803 Family history of malignant neoplasm of breast: Secondary | ICD-10-CM | POA: Diagnosis not present

## 2021-05-21 DIAGNOSIS — I10 Essential (primary) hypertension: Secondary | ICD-10-CM

## 2021-05-21 DIAGNOSIS — Z20822 Contact with and (suspected) exposure to covid-19: Secondary | ICD-10-CM | POA: Diagnosis present

## 2021-05-21 DIAGNOSIS — E039 Hypothyroidism, unspecified: Secondary | ICD-10-CM | POA: Diagnosis present

## 2021-05-21 DIAGNOSIS — Z79899 Other long term (current) drug therapy: Secondary | ICD-10-CM

## 2021-05-21 DIAGNOSIS — I2699 Other pulmonary embolism without acute cor pulmonale: Secondary | ICD-10-CM

## 2021-05-21 DIAGNOSIS — I130 Hypertensive heart and chronic kidney disease with heart failure and stage 1 through stage 4 chronic kidney disease, or unspecified chronic kidney disease: Secondary | ICD-10-CM | POA: Diagnosis present

## 2021-05-21 DIAGNOSIS — M3500 Sicca syndrome, unspecified: Secondary | ICD-10-CM | POA: Diagnosis present

## 2021-05-21 DIAGNOSIS — M329 Systemic lupus erythematosus, unspecified: Secondary | ICD-10-CM | POA: Diagnosis present

## 2021-05-21 DIAGNOSIS — E7800 Pure hypercholesterolemia, unspecified: Secondary | ICD-10-CM | POA: Diagnosis present

## 2021-05-21 DIAGNOSIS — E662 Morbid (severe) obesity with alveolar hypoventilation: Secondary | ICD-10-CM | POA: Diagnosis present

## 2021-05-21 DIAGNOSIS — Z888 Allergy status to other drugs, medicaments and biological substances status: Secondary | ICD-10-CM

## 2021-05-21 DIAGNOSIS — E1122 Type 2 diabetes mellitus with diabetic chronic kidney disease: Secondary | ICD-10-CM | POA: Diagnosis present

## 2021-05-21 DIAGNOSIS — Z823 Family history of stroke: Secondary | ICD-10-CM | POA: Diagnosis not present

## 2021-05-21 DIAGNOSIS — E1143 Type 2 diabetes mellitus with diabetic autonomic (poly)neuropathy: Secondary | ICD-10-CM

## 2021-05-21 DIAGNOSIS — Z8673 Personal history of transient ischemic attack (TIA), and cerebral infarction without residual deficits: Secondary | ICD-10-CM

## 2021-05-21 DIAGNOSIS — D134 Benign neoplasm of liver: Secondary | ICD-10-CM | POA: Diagnosis present

## 2021-05-21 DIAGNOSIS — E1169 Type 2 diabetes mellitus with other specified complication: Secondary | ICD-10-CM | POA: Diagnosis present

## 2021-05-21 DIAGNOSIS — I5042 Chronic combined systolic (congestive) and diastolic (congestive) heart failure: Secondary | ICD-10-CM | POA: Diagnosis present

## 2021-05-21 DIAGNOSIS — Z7982 Long term (current) use of aspirin: Secondary | ICD-10-CM

## 2021-05-21 DIAGNOSIS — J9601 Acute respiratory failure with hypoxia: Secondary | ICD-10-CM | POA: Diagnosis not present

## 2021-05-21 DIAGNOSIS — K219 Gastro-esophageal reflux disease without esophagitis: Secondary | ICD-10-CM | POA: Diagnosis present

## 2021-05-21 DIAGNOSIS — Z8249 Family history of ischemic heart disease and other diseases of the circulatory system: Secondary | ICD-10-CM

## 2021-05-21 DIAGNOSIS — I2609 Other pulmonary embolism with acute cor pulmonale: Secondary | ICD-10-CM

## 2021-05-21 DIAGNOSIS — E785 Hyperlipidemia, unspecified: Secondary | ICD-10-CM | POA: Diagnosis present

## 2021-05-21 DIAGNOSIS — Z7952 Long term (current) use of systemic steroids: Secondary | ICD-10-CM

## 2021-05-21 DIAGNOSIS — I071 Rheumatic tricuspid insufficiency: Secondary | ICD-10-CM | POA: Diagnosis present

## 2021-05-21 DIAGNOSIS — Z7951 Long term (current) use of inhaled steroids: Secondary | ICD-10-CM

## 2021-05-21 DIAGNOSIS — Z6841 Body Mass Index (BMI) 40.0 and over, adult: Secondary | ICD-10-CM

## 2021-05-21 DIAGNOSIS — Z8261 Family history of arthritis: Secondary | ICD-10-CM

## 2021-05-21 DIAGNOSIS — I5032 Chronic diastolic (congestive) heart failure: Secondary | ICD-10-CM | POA: Diagnosis present

## 2021-05-21 DIAGNOSIS — Z833 Family history of diabetes mellitus: Secondary | ICD-10-CM

## 2021-05-21 DIAGNOSIS — R1110 Vomiting, unspecified: Secondary | ICD-10-CM | POA: Diagnosis not present

## 2021-05-21 DIAGNOSIS — F419 Anxiety disorder, unspecified: Secondary | ICD-10-CM | POA: Diagnosis present

## 2021-05-21 DIAGNOSIS — Z7984 Long term (current) use of oral hypoglycemic drugs: Secondary | ICD-10-CM

## 2021-05-21 DIAGNOSIS — Z794 Long term (current) use of insulin: Secondary | ICD-10-CM

## 2021-05-21 HISTORY — DX: Other pulmonary embolism without acute cor pulmonale: I26.99

## 2021-05-21 LAB — COMPREHENSIVE METABOLIC PANEL
ALT: 24 U/L (ref 0–44)
AST: 75 U/L — ABNORMAL HIGH (ref 15–41)
Albumin: 3.3 g/dL — ABNORMAL LOW (ref 3.5–5.0)
Alkaline Phosphatase: 138 U/L — ABNORMAL HIGH (ref 38–126)
Anion gap: 8 (ref 5–15)
BUN: 8 mg/dL (ref 6–20)
CO2: 23 mmol/L (ref 22–32)
Calcium: 9.5 mg/dL (ref 8.9–10.3)
Chloride: 107 mmol/L (ref 98–111)
Creatinine, Ser: 1.12 mg/dL — ABNORMAL HIGH (ref 0.44–1.00)
GFR, Estimated: 60 mL/min — ABNORMAL LOW (ref >60)
Glucose, Bld: 293 mg/dL — ABNORMAL HIGH (ref 70–99)
Potassium: 4 mmol/L (ref 3.5–5.1)
Sodium: 138 mmol/L (ref 135–145)
Total Bilirubin: 0.6 mg/dL (ref 0.3–1.2)
Total Protein: 6.4 g/dL — ABNORMAL LOW (ref 6.5–8.1)

## 2021-05-21 LAB — CBG MONITORING, ED: Glucose-Capillary: 157 mg/dL — ABNORMAL HIGH (ref 70–99)

## 2021-05-21 LAB — GLUCOSE, CAPILLARY
Glucose-Capillary: 189 mg/dL — ABNORMAL HIGH (ref 70–99)
Glucose-Capillary: 243 mg/dL — ABNORMAL HIGH (ref 70–99)

## 2021-05-21 LAB — HEPARIN LEVEL (UNFRACTIONATED): Heparin Unfractionated: 0.64 IU/mL (ref 0.30–0.70)

## 2021-05-21 LAB — CBC WITH DIFFERENTIAL/PLATELET
Abs Immature Granulocytes: 0.08 10*3/uL — ABNORMAL HIGH (ref 0.00–0.07)
Basophils Absolute: 0.1 10*3/uL (ref 0.0–0.1)
Basophils Relative: 1 %
Eosinophils Absolute: 0.6 10*3/uL — ABNORMAL HIGH (ref 0.0–0.5)
Eosinophils Relative: 5 %
HCT: 41.8 % (ref 36.0–46.0)
Hemoglobin: 12.9 g/dL (ref 12.0–15.0)
Immature Granulocytes: 1 %
Lymphocytes Relative: 19 %
Lymphs Abs: 2.5 10*3/uL (ref 0.7–4.0)
MCH: 27.9 pg (ref 26.0–34.0)
MCHC: 30.9 g/dL (ref 30.0–36.0)
MCV: 90.3 fL (ref 80.0–100.0)
Monocytes Absolute: 0.9 10*3/uL (ref 0.1–1.0)
Monocytes Relative: 7 %
Neutro Abs: 8.9 10*3/uL — ABNORMAL HIGH (ref 1.7–7.7)
Neutrophils Relative %: 67 %
Platelets: 218 10*3/uL (ref 150–400)
RBC: 4.63 MIL/uL (ref 3.87–5.11)
RDW: 14.6 % (ref 11.5–15.5)
WBC: 13 10*3/uL — ABNORMAL HIGH (ref 4.0–10.5)
nRBC: 0 % (ref 0.0–0.2)

## 2021-05-21 LAB — ECHOCARDIOGRAM COMPLETE
Height: 68 in
S' Lateral: 4 cm
Weight: 5629.67 oz

## 2021-05-21 LAB — RESP PANEL BY RT-PCR (FLU A&B, COVID) ARPGX2
Influenza A by PCR: NEGATIVE
Influenza B by PCR: NEGATIVE
SARS Coronavirus 2 by RT PCR: NEGATIVE

## 2021-05-21 LAB — TSH: TSH: 2.871 u[IU]/mL (ref 0.350–4.500)

## 2021-05-21 LAB — PROTIME-INR
INR: 1 (ref 0.8–1.2)
Prothrombin Time: 13.5 seconds (ref 11.4–15.2)

## 2021-05-21 LAB — TROPONIN I (HIGH SENSITIVITY)
Troponin I (High Sensitivity): 128 ng/L (ref <18)
Troponin I (High Sensitivity): 175 ng/L (ref <18)

## 2021-05-21 LAB — BRAIN NATRIURETIC PEPTIDE: B Natriuretic Peptide: 17.9 pg/mL (ref 0.0–100.0)

## 2021-05-21 LAB — APTT: aPTT: 24 seconds (ref 24–36)

## 2021-05-21 MED ORDER — HEPARIN BOLUS VIA INFUSION
7500.0000 [IU] | Freq: Once | INTRAVENOUS | Status: AC
  Administered 2021-05-21: 7500 [IU] via INTRAVENOUS
  Filled 2021-05-21: qty 7500

## 2021-05-21 MED ORDER — CYCLOSPORINE 0.05 % OP EMUL
1.0000 [drp] | Freq: Two times a day (BID) | OPHTHALMIC | Status: DC
  Administered 2021-05-21 – 2021-05-27 (×13): 1 [drp] via OPHTHALMIC
  Filled 2021-05-21 (×15): qty 30

## 2021-05-21 MED ORDER — ACETAMINOPHEN 325 MG PO TABS
650.0000 mg | ORAL_TABLET | Freq: Four times a day (QID) | ORAL | Status: DC | PRN
  Administered 2021-05-24 – 2021-05-27 (×5): 650 mg via ORAL
  Filled 2021-05-21 (×6): qty 2

## 2021-05-21 MED ORDER — IOHEXOL 350 MG/ML SOLN
100.0000 mL | Freq: Once | INTRAVENOUS | Status: AC | PRN
  Administered 2021-05-21: 100 mL via INTRAVENOUS

## 2021-05-21 MED ORDER — PREDNISONE 1 MG PO TABS
2.0000 mg | ORAL_TABLET | Freq: Every day | ORAL | Status: DC
  Administered 2021-05-21 – 2021-05-27 (×7): 2 mg via ORAL
  Filled 2021-05-21 (×8): qty 2

## 2021-05-21 MED ORDER — ALBUTEROL SULFATE HFA 108 (90 BASE) MCG/ACT IN AERS
2.0000 | INHALATION_SPRAY | RESPIRATORY_TRACT | Status: DC | PRN
  Administered 2021-05-21: 2 via RESPIRATORY_TRACT
  Filled 2021-05-21: qty 6.7

## 2021-05-21 MED ORDER — HEPARIN (PORCINE) 25000 UT/250ML-% IV SOLN
1750.0000 [IU]/h | INTRAVENOUS | Status: AC
  Administered 2021-05-21 – 2021-05-23 (×3): 1750 [IU]/h via INTRAVENOUS
  Filled 2021-05-21 (×5): qty 250

## 2021-05-21 MED ORDER — AZATHIOPRINE 50 MG PO TABS
150.0000 mg | ORAL_TABLET | Freq: Two times a day (BID) | ORAL | Status: DC
  Administered 2021-05-21 – 2021-05-27 (×13): 150 mg via ORAL
  Filled 2021-05-21 (×15): qty 3

## 2021-05-21 MED ORDER — PERFLUTREN LIPID MICROSPHERE
1.0000 mL | INTRAVENOUS | Status: AC | PRN
  Administered 2021-05-21: 1.5 mL via INTRAVENOUS
  Filled 2021-05-21: qty 10

## 2021-05-21 MED ORDER — ALPRAZOLAM 0.5 MG PO TABS
0.5000 mg | ORAL_TABLET | Freq: Two times a day (BID) | ORAL | Status: DC | PRN
  Administered 2021-05-21 – 2021-05-26 (×4): 0.5 mg via ORAL
  Filled 2021-05-21 (×4): qty 1

## 2021-05-21 MED ORDER — ALPRAZOLAM 0.25 MG PO TABS
0.5000 mg | ORAL_TABLET | Freq: Once | ORAL | Status: AC
  Administered 2021-05-21: 0.5 mg via ORAL
  Filled 2021-05-21: qty 2

## 2021-05-21 MED ORDER — CROMOLYN SODIUM 4 % OP SOLN
1.0000 [drp] | Freq: Two times a day (BID) | OPHTHALMIC | Status: DC
  Administered 2021-05-21 – 2021-05-26 (×10): 1 [drp] via OPHTHALMIC
  Filled 2021-05-21 (×2): qty 10

## 2021-05-21 MED ORDER — INSULIN ASPART 100 UNIT/ML IJ SOLN
0.0000 [IU] | INTRAMUSCULAR | Status: DC
  Administered 2021-05-21 (×2): 4 [IU] via SUBCUTANEOUS
  Administered 2021-05-21: 7 [IU] via SUBCUTANEOUS
  Administered 2021-05-22: 15 [IU] via SUBCUTANEOUS
  Administered 2021-05-22: 4 [IU] via SUBCUTANEOUS
  Administered 2021-05-22: 11 [IU] via SUBCUTANEOUS
  Administered 2021-05-22: 20 [IU] via SUBCUTANEOUS
  Administered 2021-05-22: 11 [IU] via SUBCUTANEOUS
  Administered 2021-05-23: 15 [IU] via SUBCUTANEOUS
  Administered 2021-05-23: 11 [IU] via SUBCUTANEOUS
  Administered 2021-05-23: 10 [IU] via SUBCUTANEOUS
  Administered 2021-05-23: 7 [IU] via SUBCUTANEOUS
  Administered 2021-05-23: 15 [IU] via SUBCUTANEOUS
  Administered 2021-05-23: 7 [IU] via SUBCUTANEOUS
  Administered 2021-05-23: 4 [IU] via SUBCUTANEOUS
  Administered 2021-05-24 (×3): 7 [IU] via SUBCUTANEOUS

## 2021-05-21 MED ORDER — HYDROXYCHLOROQUINE SULFATE 200 MG PO TABS
200.0000 mg | ORAL_TABLET | Freq: Two times a day (BID) | ORAL | Status: DC
  Administered 2021-05-21 – 2021-05-27 (×13): 200 mg via ORAL
  Filled 2021-05-21 (×14): qty 1

## 2021-05-21 MED ORDER — PRAVASTATIN SODIUM 10 MG PO TABS
10.0000 mg | ORAL_TABLET | Freq: Every day | ORAL | Status: DC
  Administered 2021-05-21 – 2021-05-22 (×2): 10 mg via ORAL
  Filled 2021-05-21 (×2): qty 1

## 2021-05-21 NOTE — ED Notes (Signed)
Pt taken to CT.

## 2021-05-21 NOTE — ED Notes (Signed)
Patient transported to CT 

## 2021-05-21 NOTE — ED Notes (Signed)
Patient returned from CT, IV infiltrated per CT tech.

## 2021-05-21 NOTE — ED Provider Notes (Signed)
Emergency Department Provider Note   I have reviewed the triage vital signs and the nursing notes.   HISTORY  Chief Complaint Shortness of Breath   HPI [name] is a 50 y.o. female with past medical history reviewed below including congestive heart failure, hypertension, OSA, elevated BMI, and DM presents emergency department acute onset shortness of breath.  Patient tells me that she was feeling fairly well and got up this evening to get a drink of water from the kitchen.  She suddenly felt very short of breath and became diaphoretic.  She denies any chest pain or pressure but has had some ongoing flank pain which has been more or less chronic since her admission here in July. EMS arrived to find the patient tachycardic and with increased WOB. Arrived to the ED with NRB in place at 10 L. No nebs en route. Report clear lung sounds. Patient subjectively feels similar to when she was diagnosed with CHF. She has been complaint with torsemide and has not noticed increased LE edema. Reports this does not feel similar to anxiety in the past.    Past Medical History:  Diagnosis Date   Acute renal failure (HCC)     Intractable nausea vomiting secondary to diabetic gastroparesis causing dehydration and acute renal failure /notes 04/01/2013   Anginal pain ([place])    Anxiety    Bell's palsy 08/09/2010   Bicornuate uterus    CAP (community acquired pneumonia) 10/2011   Archie Endo 11/08/2011   Chest pain 01/13/2014   CHF (congestive heart failure) ([place])    Diabetic gastroparesis associated with type 2 diabetes mellitus ([place])    this is presumed diagnoses, not confirmed by any studies.    Eczema    Family history of malignant neoplasm of breast    Family history of malignant neoplasm of ovary    GERD (gastroesophageal reflux disease)    High cholesterol    Hypertension    Liver lesion 08/13/2019   Liver mass 2014   biopsied 03/2013 at [facility], not malignant.  is to undergo radiologic  ablation of the mass in sept/October 2014.    Lupus ([place])    Migraines    "maybe a couple times/yr" (04/01/2013)   Obesity    Obstructive sleep apnea on CPAP 2011   Oxygen at nights   SJOGREN'S SYNDROME 08/09/2010   SLE (systemic lupus erythematosus) ([place])    Archie Endo 12/01/2010   Stroke ([place]) 2010; 10/2012   "left side is still weak from it, never fully regained full strength; no additions from stroke 10/2012"    Patient Active Problem List   Diagnosis Date Noted   Pulmonary embolism ([place]) 05/21/2021   Physical debility 08/29/2019   Obesity hypoventilation syndrome ([place]) 08/23/2019   Skin breakdown 08/23/2019   Essential hypertension 08/23/2019   Liver hemorrhage 08/23/2019   Hepatic adenoma    Type 2 diabetes mellitus with hyperlipidemia ([place]) 09/19/2015   Radiculopathy of cervical spine    Family history of malignant neoplasm of breast    Family history of malignant neoplasm of ovary    Intractable nausea and vomiting 11/11/2013   Septate uterus 09/08/2013   Diabetic gastroparesis ([place]) 04/28/2013   Constipation due to pain medication 04/21/2013   Odynophagia 04/04/2013   Liver masses 02/28/2013   Reflux esophagitis 02/26/2013   Morbid obesity ([place]) 10/10/2012   Chronic diastolic CHF (congestive heart failure) ([place]) 10/09/2012   Clinical polymyositis syndrome ([place]) 09/06/2012   Mononeuritis of lower limb 01/12/2012   Hypertension  complicating diabetes ([place]) 70/96/2836   Chronic systolic CHF (congestive heart failure) ([place]) 11/08/2011   HLD (hyperlipidemia) 07/19/2011   Bell's palsy 08/09/2010   SLE 08/09/2010   SJOGREN'S SYNDROME 08/09/2010    Past Surgical History:  Procedure Laterality Date   BREAST CYST EXCISION Left 08/2005   epidermoid   CARDIAC CATHETERIZATION  02/07/12   ECTOPIC PREGNANCY SURGERY  1999   ECTOPIC PREGNANCY SURGERY  1999   ESOPHAGOGASTRODUODENOSCOPY N/A 02/26/2013   Procedure: ESOPHAGOGASTRODUODENOSCOPY (EGD);  Surgeon: Irene Shipper, MD;  Location: [facility]  ENDOSCOPY;  Service: Endoscopy;  Laterality: N/A;   ESOPHAGOGASTRODUODENOSCOPY N/A 03/02/2015   Procedure: ESOPHAGOGASTRODUODENOSCOPY (EGD);  Surgeon: Milus Banister, MD;  Location: [place];  Service: Endoscopy;  Laterality: N/A;   ESOPHAGOGASTRODUODENOSCOPY N/A 02/23/2021   Procedure: ESOPHAGOGASTRODUODENOSCOPY (EGD);  Surgeon: Wilford Corner, MD;  Location: [place];  Service: Endoscopy;  Laterality: N/A;  possible dilation   HERNIA REPAIR     LEFT AND RIGHT HEART CATHETERIZATION WITH CORONARY ANGIOGRAM N/A 02/07/2012   Procedure: LEFT AND RIGHT HEART CATHETERIZATION WITH CORONARY ANGIOGRAM;  Surgeon: Laverda Page, MD;  Location: [facility] CATH LAB;  Service: Cardiovascular;  Laterality: N/A;   LIVER BIOPSY  03/2013   liver mass/medical hx noted above   MUSCLE BIOPSY     for lupus/notes 01/01/9475   UMBILICAL HERNIA REPAIR  1980's    Allergies Metoclopramide, Codeine, Hydrocodone, Penicillins, and Percocet [oxycodone-acetaminophen]  Family History  Problem Relation Age of Onset   Diabetes Mother    Asthma Mother    Urticaria Mother    Hypertension Mother    Breast cancer Maternal Aunt 52       currently 73   Stomach cancer Maternal Uncle        dx 37s; deceased   Prostate cancer Maternal Uncle        deceased 74s   Cancer Maternal Aunt        unk. primary   Cancer Maternal Aunt        unk. primary   Ovarian cancer Maternal Aunt        deceased 19s   Hypertension Other    Stroke Other    Arthritis Other    Ovarian cancer Sister 79       maternal half-sister; RAD51C positive   Cancer Paternal Aunt        unk. primary; deceased 46s   Prostate cancer Paternal Uncle        deceased 60s   Breast cancer Cousin        deceased 52s; daughter of mat uncle with stomach ca   Breast cancer Maternal Aunt        deceased 39s   Asthma Brother    Asthma Brother    Allergic rhinitis Neg Hx    Angioedema Neg Hx     Social History Social History   Tobacco Use   Smoking  status: Former    Packs/day: 0.50    Years: 3.00    Pack years: 1.50    Types: Cigarettes    Quit date: 06/02/1991    Years since quitting: 29.9   Smokeless tobacco: Never   Tobacco comments:    quit 28 yrs ago  Vaping Use   Vaping Use: Never used  Substance Use Topics   Alcohol use: No    Alcohol/week: 0.0 standard drinks   Drug use: No    Review of Systems  Constitutional: No fever/chills Eyes: No visual changes. ENT: No sore throat. Cardiovascular: Denies chest pain.  Respiratory: Positive shortness of breath. Gastrointestinal: Positive left flank/abdominal pain.  No nausea, no vomiting.  No diarrhea.  No constipation. Genitourinary: Negative for dysuria. Musculoskeletal: Negative for back pain. Skin: Negative for rash. Neurological: Negative for headaches, focal weakness or numbness.  10-point ROS otherwise negative.  ____________________________________________   PHYSICAL EXAM:  VITAL SIGNS: ED Triage Vitals  Enc Vitals Group     BP 05/21/21 0159 (!) 155/126     Pulse Rate 05/21/21 0159 (!) 131     Resp --      Temp 05/21/21 0200 98.1 F (36.7 C)     Temp Source 05/21/21 0200 Oral     SpO2 05/21/21 0159 100 %    Constitutional: Alert and oriented. NRB in place with tachypnea noted. Patient able to provide a history. Speaking in 3-4 word sentences.  Eyes: Conjunctivae are normal.  Head: Atraumatic. Nose: No congestion/rhinnorhea. Mouth/Throat: Mucous membranes are moist.  Oropharynx non-erythematous. Neck: No stridor.   Cardiovascular: Tachycardia. Good peripheral circulation. Grossly normal heart sounds.   Respiratory: Increased respiratory effort.  No retractions. Lungs CTAB. No appreciable wheezing.  Gastrointestinal: Soft and nontender. No distention.  Musculoskeletal: No lower extremity tenderness nor edema. No gross deformities of extremities. Neurologic:  Normal speech and language. No gross focal neurologic deficits are appreciated.  Skin:  Skin  is warm, dry and intact. No rash noted.  ____________________________________________   LABS (all labs ordered are listed, but only abnormal results are displayed)  Labs Reviewed  COMPREHENSIVE METABOLIC PANEL - Abnormal; Notable for the following components:      Result Value   Glucose, Bld 293 (*)    Creatinine, Ser 1.12 (*)    Total Protein 6.4 (*)    Albumin 3.3 (*)    AST 75 (*)    Alkaline Phosphatase 138 (*)    GFR, Estimated 60 (*)    All other components within normal limits  CBC WITH DIFFERENTIAL/PLATELET - Abnormal; Notable for the following components:   WBC 13.0 (*)    Neutro Abs 8.9 (*)    Eosinophils Absolute 0.6 (*)    Abs Immature Granulocytes 0.08 (*)    All other components within normal limits  HEMOGLOBIN A1C - Abnormal; Notable for the following components:   Hgb A1c MFr Bld 12.9 (*)    All other components within normal limits  CBC - Abnormal; Notable for the following components:   WBC 10.8 (*)    All other components within normal limits  COMPREHENSIVE METABOLIC PANEL - Abnormal; Notable for the following components:   Glucose, Bld 250 (*)    Calcium 8.7 (*)    Total Protein 5.9 (*)    Albumin 2.8 (*)    All other components within normal limits  GLUCOSE, CAPILLARY - Abnormal; Notable for the following components:   Glucose-Capillary 189 (*)    All other components within normal limits  GLUCOSE, CAPILLARY - Abnormal; Notable for the following components:   Glucose-Capillary 243 (*)    All other components within normal limits  CBG MONITORING, ED - Abnormal; Notable for the following components:   Glucose-Capillary 157 (*)    All other components within normal limits  TROPONIN I (HIGH SENSITIVITY) - Abnormal; Notable for the following components:   Troponin I (High Sensitivity) 128 (*)    All other components within normal limits  TROPONIN I (HIGH SENSITIVITY) - Abnormal; Notable for the following components:   Troponin I (High Sensitivity) 175  (*)    All other components within normal  limits  RESP PANEL BY RT-PCR (FLU A&B, COVID) ARPGX2  BRAIN NATRIURETIC PEPTIDE  TSH  APTT  PROTIME-INR  HEPARIN LEVEL (UNFRACTIONATED)  HEPARIN LEVEL (UNFRACTIONATED)   ____________________________________________  EKG   EKG Interpretation  Date/Time:  Friday May 21 2021 02:02:51 EDT Ventricular Rate:  129 PR Interval:  104 QRS Duration: 138 QT Interval:  383 QTC Calculation: 562 R Axis:   21 Text Interpretation: Sinus tachycardia Nonspecific intraventricular conduction delay Anteroseptal infarct, old Nonspecific repol abnormality, diffuse leads Baseline wander in lead(s) II III aVR aVF V1 V2 V3 Nonspecific ST changes likely rate related. Confirmed by Nanda Quinton [phone]) on 05/21/2021 2:36:03 AM        ____________________________________________  RADIOLOGY  CXR reviewed.   ____________________________________________   PROCEDURES  Procedure(s) performed:   Procedures  CRITICAL CARE Performed by: Margette Fast Total critical care time: 35 minutes Critical care time was exclusive of separately billable procedures and treating other patients. Critical care was necessary to treat or prevent imminent or life-threatening deterioration. Critical care was time spent personally by me on the following activities: development of treatment plan with patient and/or surrogate as well as nursing, discussions with consultants, evaluation of patient's response to treatment, examination of patient, obtaining history from patient or surrogate, ordering and performing treatments and interventions, ordering and review of laboratory studies, ordering and review of radiographic studies, pulse oximetry and re-evaluation of patient's condition.  Nanda Quinton, MD Emergency Medicine  ____________________________________________   INITIAL IMPRESSION / ASSESSMENT AND PLAN / ED COURSE  Pertinent labs & imaging results that were available  during my care of the patient were reviewed by me and considered in my medical decision making (see chart for details).   Patient presents emergency department with shortness of breath.  She arrives with tachycardia and some increased respiratory rate.  Arrives on a nonrebreather.  Differential remains broad at this time and includes COPD, CHF, CAP, COVID, ACS, PE among others. Will begin with labs including Troponin, BNP, CXR, and COVID/Flu PCR. Wean O2 as able. Will give home dose Xanax and albuterol as well.   Patient remains very tachycardic.  Unable to wean her oxygen significantly although have been able to place her on 6 L nasal cannula.  Lab work reviewed.  She has no acute kidney injury.  Troponin is elevated mildly.  Suspicion for PE is increased in the setting.  Patient is going for CTA. Care transferred to Dr. Ronnald Nian.    I reviewed all nursing notes, vitals, pertinent old records, EKGs, labs, imaging (as available).  ____________________________________________  FINAL CLINICAL IMPRESSION(S) / ED DIAGNOSES  Final diagnoses:  Acute pulmonary embolism, unspecified pulmonary embolism type, unspecified whether acute cor pulmonale present ([place])  Acute respiratory failure with hypoxia ([place])     MEDICATIONS GIVEN DURING THIS VISIT:  Medications  albuterol (VENTOLIN HFA) 108 (90 Base) MCG/ACT inhaler 2 puff (2 puffs Inhalation Given 05/21/21 0720)  heparin ADULT infusion 100 units/mL (25000 units/233m) (1,750 Units/hr Intravenous Infusion Verify 05/21/21 1645)  perflutren lipid microspheres (DEFINITY) IV suspension (1.5 mLs Intravenous Given 05/21/21 1211)  ALPRAZolam (XANAX) tablet 0.5 mg (0.5 mg Oral Given 05/21/21 2155)  azaTHIOprine (IMURAN) tablet 150 mg (150 mg Oral Given 05/21/21 2327)  pravastatin (PRAVACHOL) tablet 10 mg (10 mg Oral Given 05/21/21 2155)  cromolyn (OPTICROM) 4 % ophthalmic solution 1 drop (1 drop Both Eyes Given 05/21/21 2336)  cycloSPORINE (RESTASIS) 0.05  % ophthalmic emulsion 1 drop (1 drop Both Eyes Given 05/21/21 2336)  hydroxychloroquine (PLAQUENIL) tablet 200  mg (200 mg Oral Given 05/21/21 2155)  insulin aspart (novoLOG) injection 0-20 Units (7 Units Subcutaneous Given 05/21/21 2336)  predniSONE (DELTASONE) tablet 2 mg (2 mg Oral Given 05/21/21 1627)  acetaminophen (TYLENOL) tablet 650 mg (650 mg Oral Not Given 05/21/21 2344)  ALPRAZolam (XANAX) tablet 0.5 mg (0.5 mg Oral Given 05/21/21 0244)  iohexol (OMNIPAQUE) 350 MG/ML injection 100 mL (100 mLs Intravenous Contrast Given 05/21/21 0704)  heparin bolus via infusion 7,500 Units (7,500 Units Intravenous Bolus from Bag 05/21/21 0826)  naproxen (NAPROSYN) tablet 500 mg (500 mg Oral Given 05/22/21 0144)     Note:  This document was prepared using Dragon voice recognition software and may include unintentional dictation errors.  Nanda Quinton, MD, [facility] Emergency Medicine    Kejon Feild, [facility], MD 05/22/21 [phone]

## 2021-05-21 NOTE — Progress Notes (Signed)
ANTICOAGULATION CONSULT NOTE - Initial Consult  Pharmacy Consult for heparin Indication: pulmonary embolus  Allergies  Allergen Reactions   Metoclopramide Other (See Comments)    Developed restless leg, akathisia type limb movements.    Codeine Itching, Rash and Other (See Comments)   Hydrocodone Rash   Penicillins Itching    Tolerated Unasyn 02/01/2020   Percocet [Oxycodone-Acetaminophen] Hives and Rash    Tolerates dilaudid Tolerates acetaminophen     Patient Measurements: Height: 5\' 8"  (172.7 cm) Weight: (!) 159.6 kg (351 lb 13.7 oz) IBW/kg (Calculated) : 63.9 Heparin Dosing Weight: 103.8 kg  Vital Signs: BP: 118/89 (10/21 1626) Pulse Rate: 107 (10/21 1626)  Labs: Recent Labs    05/21/21 0208 05/21/21 0408 05/21/21 0810 05/21/21 1625  HGB 12.9  --   --   --   HCT 41.8  --   --   --   PLT 218  --   --   --   APTT  --   --  24  --   LABPROT  --   --  13.5  --   INR  --   --  1.0  --   HEPARINUNFRC  --   --   --  0.64  CREATININE 1.12*  --   --   --   TROPONINIHS 128* 175*  --   --      Estimated Creatinine Clearance: 97 mL/min (A) (by C-G formula based on SCr of 1.12 mg/dL (H)).   Medical History: Past Medical History:  Diagnosis Date   Acute renal failure (HCC)     Intractable nausea vomiting secondary to diabetic gastroparesis causing dehydration and acute renal failure /notes 04/01/2013   Anginal pain ([place])    Anxiety    Bell's palsy 08/09/2010   Bicornuate uterus    CAP (community acquired pneumonia) 10/2011   Archie Endo 11/08/2011   Chest pain 01/13/2014   CHF (congestive heart failure) ([place])    Diabetic gastroparesis associated with type 2 diabetes mellitus ([place])    this is presumed diagnoses, not confirmed by any studies.    Eczema    Family history of malignant neoplasm of breast    Family history of malignant neoplasm of ovary    GERD (gastroesophageal reflux disease)    High cholesterol    Hypertension    Liver lesion 08/13/2019   Liver mass 2014    biopsied 03/2013 at [facility], not malignant.  is to undergo radiologic ablation of the mass in sept/October 2014.    Lupus ([place])    Migraines    "maybe a couple times/yr" (04/01/2013)   Obesity    Obstructive sleep apnea on CPAP 2011   Oxygen at nights   SJOGREN'S SYNDROME 08/09/2010   SLE (systemic lupus erythematosus) ([place])    Archie Endo 12/01/2010   Stroke ([place]) 2010; 10/2012   "left side is still weak from it, never fully regained full strength; no additions from stroke 10/2012"    Medications: see MAR  Assessment: 50 yo F with bilateral occlusive acute PE with right heart strain - RV/LV ratio = 1.15, consistent with at least submassive PE. No AC PTA. CBC baseline wnl.   Heparin level at 1630 back at 0.64 which is therapeutic. No issues noted per RN.  Goal of Therapy:  Heparin level 0.3-0.7 units/ml Monitor platelets by anticoagulation protocol: Yes   Plan:  Resume heparin infusion at 1750 units/hr Check anti-Xa level in 6 hours and daily while on heparin Continue to monitor H&H and  platelets F/u plan to transition to PO [facility]  Lorelei Pont, PharmD, BCPS 05/21/2021 4:56 PM ED Clinical Pharmacist -  [phone]

## 2021-05-21 NOTE — H&P (Addendum)
Date: 05/21/2021               Patient Name:  [name] MRN: [id number]  DOB: 09-20-1970 Age / Sex: 50 y.o., female   PCP: Bartholome Bill, MD         Medical Service: Internal Medicine Teaching Service         Attending Physician: Dr. Evette Doffing, Mallie Mussel, *    First Contact: Idamae Schuller, MD Pager: Teodora Medici [phone]  Second Contact: Sanjuana Letters, DO Pager: Maryjean Morn [phone]       After Hours (After 5p/  First Contact Pager: [phone]  weekends / holidays): Second Contact Pager: [phone]   SUBJECTIVE  Chief Complaint: Shortness of breath  History of Present Illness: [name] is a 50 y.o. female with a pertinent PMH of lupus, Sjogren's syndrome, congestive heart failure, type 2 diabetes, HLD, HTN, obesity, OSA, and stroke, who presents to [facility] with shortness of breath.  Patient states that she was in her usual state of health until last night while she was walking into the kitchen to get a glass of water to take her evening medications.  She states that when she was feeling the glass she felt sudden shortness of breath.  She denies any dizziness, lightheadedness, headaches, vision changes, chest pain, dyspnea, or abdominal pain.  She states that she feels like this feels similar to when she was first diagnosed with congestive heart failure, but she does not have any chest pain.  She did not fall during this event. She denies any long travel distance more than 8 hours, and states that her longest route is to the grocery store down the block. Denies any active cancer, and does ambulate around the house at baseline.  In the ED, the patient was found to be hypoxic, and required a nonrebreather and was tachycardic into the 130s.  CXR was negative, but subsequent CT angio chest revealed bilateral occlusive acute pulmonary emboli of the proximal segment and pulmonary arteries, with right ventricular strain RV to LV ratio = 1.15 massive PE.  PCCM consulted, and medicine was consulted for  admission.  Medications: No current facility-administered medications on file prior to encounter.   Current Outpatient Medications on File Prior to Encounter  Medication Sig Dispense Refill   ADVAIR HFA 230-21 MCG/ACT inhaler Inhale 1-2 puffs into the lungs 2 (two) times daily.     albuterol (PROVENTIL) (2.5 MG/3ML) 0.083% nebulizer solution Take 2.5 mg by nebulization every 6 (six) hours as needed for wheezing.     ALPRAZolam (XANAX) 0.5 MG tablet Take 0.5 mg 2 (two) times daily as needed by mouth for anxiety or sleep.      amLODipine (NORVASC) 10 MG tablet Take 1 tablet (10 mg total) by mouth daily. 90 tablet 3   ARIPiprazole (ABILIFY) 5 MG tablet Take 5 mg by mouth daily.     aspirin EC 81 MG tablet Take 81 mg by mouth daily. Swallow whole.     Azathioprine 75 MG TABS Take 150 mg by mouth 2 (two) times daily.     Belimumab (BENLYSTA) 200 MG/ML SOAJ Inject 200 mg into the skin once a week. Thursdays     benzonatate (TESSALON) 100 MG capsule Take 1 capsule by mouth 3 (three) times daily as needed for cough.     carvedilol (COREG) 25 MG tablet Take 1.5 tablets (37.5 mg total) by mouth 2 (two) times daily with a meal. 270 tablet 3   cromolyn (OPTICROM) 4 % ophthalmic solution  Place 1 drop into both eyes 2 (two) times daily.     cycloSPORINE (RESTASIS) 0.05 % ophthalmic emulsion Place 1 drop into both eyes in the morning, at noon, and at bedtime.      diclofenac Sodium (VOLTAREN) 1 % GEL Apply 2 g topically 4 (four) times daily. 350 g 0   DULoxetine (CYMBALTA) 60 MG capsule Take 120 mg by mouth at bedtime.     ENTRESTO 97-103 MG TAKE 1 TABLET BY MOUTH 2 (TWO) TIMES DAILY. (Patient taking differently: Take 1 tablet by mouth 2 (two) times daily.) 180 tablet 3   famotidine (PEPCID) 20 MG tablet Take 20 mg by mouth 2 (two) times daily.     fluticasone (FLONASE) 50 MCG/ACT nasal spray Place 2 sprays into both nostrils continuous as needed for allergies or rhinitis.     hydroxychloroquine (PLAQUENIL)  200 MG tablet Take 200 mg by mouth 2 (two) times daily.     isosorbide-hydrALAZINE (BIDIL) 20-37.5 MG tablet TAKE TWO (2) TABLETS BY MOUTH THREE TIMES DAILY (Patient taking differently: Take 2 tablets by mouth 3 (three) times daily.) 270 tablet 3   levothyroxine (SYNTHROID) 50 MCG tablet Take 1 tablet (50 mcg total) by mouth daily at 6 (six) AM.     megestrol (MEGACE) 40 MG tablet Take 40 mg by mouth daily.  3   metFORMIN (GLUCOPHAGE-XR) 500 MG 24 hr tablet Take 2 tablets (1,000 mg total) by mouth 2 (two) times daily. 180 tablet 3   Multiple Vitamin (MULTIVITAMIN WITH MINERALS) TABS tablet Take 1 tablet by mouth daily.     ondansetron (ZOFRAN ODT) 4 MG disintegrating tablet Take 1 tablet (4 mg total) by mouth every 8 (eight) hours as needed for nausea or vomiting. 20 tablet 0   oxybutynin (DITROPAN XL) 15 MG 24 hr tablet Take 15 mg by mouth daily.      pravastatin (PRAVACHOL) 10 MG tablet Take 1 tablet (10 mg total) by mouth daily after supper. 90 tablet 3   predniSONE (DELTASONE) 1 MG tablet Take 2 mg by mouth daily.     pregabalin (LYRICA) 75 MG capsule Take 75 mg 3 (three) times daily by mouth.      QUEtiapine (SEROQUEL) 300 MG tablet Take 300 mg by mouth at bedtime.     spironolactone (ALDACTONE) 50 MG tablet Take 1 tablet (50 mg total) by mouth daily. 90 tablet 3   sucralfate (CARAFATE) 1 GM/10ML suspension Take 10 mLs by mouth 2 (two) times daily.     topiramate (TOPAMAX) 25 MG tablet Take 75 mg by mouth at bedtime.     torsemide (DEMADEX) 20 MG tablet Take 20 mg by mouth at bedtime.     traMADol (ULTRAM) 50 MG tablet Take 50-100 mg by mouth every 6 (six) hours as needed for moderate pain.     VICTOZA 18 MG/3ML SOPN INJECT 1.8 MG UNDER THE SKIN ONCE DAILY IN THE MORNING (Patient taking differently: Inject 1.8 mg into the skin daily.) 6 mL 1   ACCU-CHEK AVIVA PLUS test strip USE TO TEST BLOOD SUGAR 4 TIMES DAILY 100 strip 2   B-D UF III MINI PEN NEEDLES 31G X 5 MM MISC USE AS DIRECTED TWICE A  DAY 100 each 2   Blood Glucose Monitoring Suppl (ONETOUCH VERIO) w/Device KIT Test 4 times daily.  DX E11.9 1 kit 1   clotrimazole-betamethasone (LOTRISONE) cream Apply 1 application topically 2 (two) times daily. (Patient not taking: Reported on 05/21/2021) 45 g 3   EASY TOUCH PEN  NEEDLES 32G X 4 MM MISC USE TO INJECT INSULIN TWICE DAILY 100 each 9   glucose blood (ONETOUCH VERIO) test strip Test 4 times daily DX E11.9 100 each 12   glucose blood test strip Test 4 times daily DX E11.9 100 each 12   Insulin NPH, Human,, Isophane, (HUMULIN N KWIKPEN) 100 UNIT/ML Kiwkpen Inject 260 Units into the skin every morning. (Patient not taking: Reported on 05/21/2021) 270 mL 3   Insulin Syringe-Needle U-100 (EASY TOUCH INSULIN SYRINGE) 31G X 5/16" 1 ML MISC USE AS DIRECTED THREE TIMES A DAY 100 each 10   Lancets (ONETOUCH ULTRASOFT) lancets Test 4 times daily DX E11.9 100 each 12   pantoprazole (PROTONIX) 40 MG tablet Take 1 tablet (40 mg total) by mouth 2 (two) times daily. (Patient not taking: Reported on 05/21/2021) 60 tablet 1   polyethylene glycol (MIRALAX / GLYCOLAX) 17 g packet Take 17 g by mouth daily. (Patient not taking: Reported on 05/21/2021) 14 each 0    Past Medical History:  Past Medical History:  Diagnosis Date   Acute renal failure (HCC)     Intractable nausea vomiting secondary to diabetic gastroparesis causing dehydration and acute renal failure /notes 04/01/2013   Anginal pain ([place])    Anxiety    Bell's palsy 08/09/2010   Bicornuate uterus    CAP (community acquired pneumonia) 10/2011   Archie Endo 11/08/2011   Chest pain 01/13/2014   CHF (congestive heart failure) ([place])    Diabetic gastroparesis associated with type 2 diabetes mellitus ([place])    this is presumed diagnoses, not confirmed by any studies.    Eczema    Family history of malignant neoplasm of breast    Family history of malignant neoplasm of ovary    GERD (gastroesophageal reflux disease)    High cholesterol    Hypertension     Liver lesion 08/13/2019   Liver mass 2014   biopsied 03/2013 at [facility], not malignant.  is to undergo radiologic ablation of the mass in sept/October 2014.    Lupus ([place])    Migraines    "maybe a couple times/yr" (04/01/2013)   Obesity    Obstructive sleep apnea on CPAP 2011   Oxygen at nights   SJOGREN'S SYNDROME 08/09/2010   SLE (systemic lupus erythematosus) ([place])    Archie Endo 12/01/2010   Stroke ([place]) 2010; 10/2012   "left side is still weak from it, never fully regained full strength; no additions from stroke 10/2012"    Social:  Lives -at home Occupation -retired Research officer, trade union -her sister lives in the house next-door she is able to help when called Level of function -[place] able to perform all ADLs independently PCP -Precious Haws, MD Substance use -prior smoker quit 30 years ago, denies alcohol or drug use.  Family History: Family History  Problem Relation Age of Onset   Diabetes Mother    Asthma Mother    Urticaria Mother    Hypertension Mother    Breast cancer Maternal Aunt 24       currently 84   Stomach cancer Maternal Uncle        dx 39s; deceased   Prostate cancer Maternal Uncle        deceased 77s   Cancer Maternal Aunt        unk. primary   Cancer Maternal Aunt        unk. primary   Ovarian cancer Maternal Aunt        deceased 75s   Hypertension Other  Stroke Other    Arthritis Other    Ovarian cancer Sister 96       maternal half-sister; RAD51C positive   Cancer Paternal Aunt        unk. primary; deceased 57s   Prostate cancer Paternal Uncle        deceased 63s   Breast cancer Cousin        deceased 3s; daughter of mat uncle with stomach ca   Breast cancer Maternal Aunt        deceased 77s   Asthma Brother    Asthma Brother    Allergic rhinitis Neg Hx    Angioedema Neg Hx     Allergies: Allergies as of 05/21/2021 - Review Complete 05/21/2021  Allergen Reaction Noted   Metoclopramide Other (See Comments) 04/25/2013   Codeine Itching, Rash,  and Other (See Comments) 05/09/2013   Hydrocodone Rash 08/07/2016   Penicillins Itching    Percocet [oxycodone-acetaminophen] Hives and Rash 10/06/2016    Review of Systems: A complete ROS was negative except as per HPI.   OBJECTIVE:  Physical Exam: Blood pressure 124/81, pulse (!) 113, temperature 98.1 F (36.7 C), temperature source Oral, resp. rate (!) 25, height _0  (1.727 m), weight (!) 159.6 kg, last menstrual period 03/24/2017, SpO2 97 %. Physical Exam Vitals and nursing note reviewed.  Constitutional:      General: She is in acute distress.     Appearance: She is obese. She is not ill-appearing, toxic-appearing or diaphoretic.  Cardiovascular:     Rate and Rhythm: Regular rhythm. Tachycardia present.     Pulses: Normal pulses.     Heart sounds: Normal heart sounds. No murmur heard.   No friction rub. No gallop.  Pulmonary:     Effort: Pulmonary effort is normal.     Breath sounds: Normal breath sounds. No stridor. No wheezing, rhonchi or rales.  Chest:     Chest wall: No tenderness.  Abdominal:     General: Bowel sounds are normal. There is no distension.     Palpations: Abdomen is soft.     Tenderness: There is no abdominal tenderness. There is no guarding.  Musculoskeletal:     Right lower leg: No edema.     Left lower leg: No edema.  Skin:    General: Skin is warm and dry.  Neurological:     Mental Status: She is alert and oriented to person, place, and time.  Psychiatric:        Behavior: Behavior normal.    Pertinent Labs: CBC    Component Value Date/Time   WBC 13.0 (H) 05/21/2021 0208   RBC 4.63 05/21/2021 0208   HGB 12.9 05/21/2021 0208   HCT 41.8 05/21/2021 0208   PLT 218 05/21/2021 0208   MCV 90.3 05/21/2021 0208   MCH 27.9 05/21/2021 0208   MCHC 30.9 05/21/2021 0208   RDW 14.6 05/21/2021 0208   LYMPHSABS 2.5 05/21/2021 0208   MONOABS 0.9 05/21/2021 0208   EOSABS 0.6 (H) 05/21/2021 0208   BASOSABS 0.1 05/21/2021 0208     CMP      Component Value Date/Time   NA 138 05/21/2021 0208   NA 140 11/01/2018 1525   K 4.0 05/21/2021 0208   CL 107 05/21/2021 0208   CO2 23 05/21/2021 0208   GLUCOSE 293 (H) 05/21/2021 0208   BUN 8 05/21/2021 0208   BUN 8 11/01/2018 1525   CREATININE 1.12 (H) 05/21/2021 0208   CALCIUM 9.5 05/21/2021 0208   PROT  6.4 (L) 05/21/2021 0208   ALBUMIN 3.3 (L) 05/21/2021 0208   AST 75 (H) 05/21/2021 0208   ALT 24 05/21/2021 0208   ALKPHOS 138 (H) 05/21/2021 0208   BILITOT 0.6 05/21/2021 0208   GFRNONAA 60 (L) 05/21/2021 0208   GFRAA >60 02/07/2020 0315    Pertinent Imaging: DG Chest 2 View  Result Date: 05/21/2021 CLINICAL DATA:  Shortness of breath EXAM: CHEST - 2 VIEW COMPARISON:  02/21/2021 FINDINGS: Lungs are clear.  No pleural effusion or pneumothorax. Mild cardiomegaly. Visualized osseous structures are within normal limits. IMPRESSION: Mild cardiomegaly. No evidence of acute cardiopulmonary disease. Electronically Signed   By: [name] Hy M.D.   On: 05/21/2021 02:33   CT Angio Chest PE W and/or Wo Contrast  Result Date: 05/21/2021 CLINICAL DATA:  Concern for pulmonary embolism. EXAM: CT ANGIOGRAPHY CHEST WITH CONTRAST TECHNIQUE: Multidetector CT imaging of the chest was performed using the standard protocol during bolus administration of intravenous contrast. Multiplanar CT image reconstructions and MIPs were obtained to evaluate the vascular anatomy. CONTRAST:  180m OMNIPAQUE IOHEXOL 350 MG/ML SOLN COMPARISON:  None. FINDINGS: Cardiovascular: Tubular filling defects within the proximal segmental pulmonary arteries of the RIGHT lower lobe and RIGHT middle lobe as well as the RIGHT upper lobe. These emboli are occlusive and partially occlusive. Similar tubular filling defects within the proximal segmental pulmonary arteries of the LEFT lower lobe and lingula. Thromboemboli are occlusive in the lingula and partially occlusive in the LEFT lower lobe. There is evidence of RIGHT  ventricular strain with the RV to LV ratio (4.6:4.0) = 1.15 Mediastinum/Nodes: No axillary or supraclavicular adenopathy. No mediastinal or hilar adenopathy. No pericardial fluid. Esophagus normal. Lungs/Pleura: No pulmonary infarction. Upper Abdomen: Limited view of the liver, kidneys, pancreas are unremarkable. Normal adrenal glands. Musculoskeletal: No acute osseous abnormality. Review of the MIP images confirms the above findings. IMPRESSION: 1. Bilateral occlusive acute pulmonary emboli within the proximal segmental pulmonary arteries. Overall clot burden is severe. Evidence of RIGHT ventricular strain (RV/LV Ratio = 1.15) consistent with at least submassive (intermediate risk) PE. The presence of right heart strain has been associated with an increased risk of morbidity and mortality. Please refer to the "PE Focused" order set in EPIC. 2. No pulmonary infarction. Critical Value/emergent results were called by telephone at the time of interpretation on 05/21/2021 at 7:23 am to provider CRonnald Nian MD, who verbally acknowledged these results. Electronically Signed   By: SSuzy BouchardM.D.   On: 05/21/2021 07:24    EKG: personally reviewed my interpretation is normal EKG, normal sinus rhythm, unchanged from previous tracings, sinus tachycardia  ASSESSMENT & PLAN:  Assessment: Active Problems:   Pulmonary embolism (HCC)   [name] OTTERSONis a 50y.o. with pertinent PMH of lipids, Sjogren syndrome, congestive heart failure, obesity, OSA who presented with shortness of breath and admit for submassive PE on hospital day 0  Plan: #Submassive PE #Lupus: Patient presents with shortness of breath and found to have a submassive PE with Pesi score of 102 that she has had intermittent risk of 30-day mortality.  Patient is mobile at baseline, has not traveled long distances, or had any trauma. Does have a history of lupus, and is currently taking hydroxychloroquine, weekly Benlysta (on Thursdays), and  prednisone. She was previously on Plavix, but this was discontinued in the work-up of the liver mass and was never restarted.  She is currently tachycardic and saturating well on 5 L of supplemental oxygen.  We will continue to monitor on  heparin. - Appreciate PCCM's recommendations and assessment for lytics. - Continue heparin per pharmacy - Continue hydroxychloroquine, prednisone, and azathioprine  #HFmrEF #HTN Last echo 6//2022 showing ejection fraction of 45 to 50%, with mild ventricular agitation with moderate left ventricular hypertrophy.  Medications include Entresto, Coreg, spironolactone, BiDil, and Norvasc.  We will currently hold these medications in the setting of submassive PE.  #Hypothyroidism -Levothyroxine 50 MCG  #Type 2 DM Her regimen metformin 1000 mg twice daily and B20 1.8 mg daily -Start SSI  #HLD -Pravastatin 10 mg  #GERD -Pepcid 20 mg  #OSA -CPAP at night  #Stroke -On aspirin 81 mg  Best Practice: Diet: Diabetic diet IVF: Fluids: None, Rate: None VTE:   Heparin per pharmacy Code: Full AB: None Status: Inpatient with expected length of stay greater than 2 midnights. Anticipated Discharge Location:  Pending medical work-up Barriers to Discharge: Continued medical work-up  Signature: Maudie Mercury, MD Internal Medicine Resident, PGY-3 [facility]  2:15 PM, 05/21/2021   Please contact the on call pager after 5 pm and on weekends at [phone].

## 2021-05-21 NOTE — Progress Notes (Signed)
ANTICOAGULATION CONSULT NOTE - Initial Consult  Pharmacy Consult for heparin Indication: pulmonary embolus  Allergies  Allergen Reactions   Metoclopramide Other (See Comments)    Developed restless leg, akathisia type limb movements.    Codeine Itching, Rash and Other (See Comments)   Hydrocodone Rash   Penicillins Itching    Tolerated Unasyn 02/01/2020   Percocet [Oxycodone-Acetaminophen] Hives and Rash    Tolerates dilaudid Tolerates acetaminophen     Patient Measurements: Height: 5\' 8"  (172.7 cm) Weight: (!) 159.6 kg (351 lb 13.7 oz) IBW/kg (Calculated) : 63.9 Heparin Dosing Weight: 103.8 kg  Vital Signs: Temp: 98.1 F (36.7 C) (10/21 0200) Temp Source: Oral (10/21 0200) BP: 108/80 (10/21 0718) Pulse Rate: 132 (10/21 0718)  Labs: Recent Labs    05/21/21 0208 05/21/21 0408  HGB 12.9  --   HCT 41.8  --   PLT 218  --   CREATININE 1.12*  --   TROPONINIHS 128* 175*    Estimated Creatinine Clearance: 97 mL/min (A) (by C-G formula based on SCr of 1.12 mg/dL (H)).   Medical History: Past Medical History:  Diagnosis Date   Acute renal failure (HCC)     Intractable nausea vomiting secondary to diabetic gastroparesis causing dehydration and acute renal failure /notes 04/01/2013   Anginal pain ([place])    Anxiety    Bell's palsy 08/09/2010   Bicornuate uterus    CAP (community acquired pneumonia) 10/2011   Archie Endo 11/08/2011   Chest pain 01/13/2014   CHF (congestive heart failure) ([place])    Diabetic gastroparesis associated with type 2 diabetes mellitus ([place])    this is presumed diagnoses, not confirmed by any studies.    Eczema    Family history of malignant neoplasm of breast    Family history of malignant neoplasm of ovary    GERD (gastroesophageal reflux disease)    High cholesterol    Hypertension    Liver lesion 08/13/2019   Liver mass 2014   biopsied 03/2013 at [facility], not malignant.  is to undergo radiologic ablation of the mass in sept/October 2014.     Lupus ([place])    Migraines    "maybe a couple times/yr" (04/01/2013)   Obesity    Obstructive sleep apnea on CPAP 2011   Oxygen at nights   SJOGREN'S SYNDROME 08/09/2010   SLE (systemic lupus erythematosus) ([place])    Archie Endo 12/01/2010   Stroke ([place]) 2010; 10/2012   "left side is still weak from it, never fully regained full strength; no additions from stroke 10/2012"    Medications: see MAR  Assessment: 50 yo F with bilateral occlusive acute PE with right heart strain - RV/LV ratio = 1.15, consistent with at least submassive PE. No AC PTA. CBC baseline wnl.   Goal of Therapy:  Heparin level 0.3-0.7 units/ml Monitor platelets by anticoagulation protocol: Yes   Plan: Give 7500 units bolus x 1 Start heparin infusion at 1750 units/hr Check anti-Xa level in 6 hours and daily while on heparin Continue to monitor H&H and platelets F/u plan to transition to [place], PharmD, [facility] Emergency Medicine Clinical Pharmacist ED RPh Phone: [place]: [phone]

## 2021-05-21 NOTE — Progress Notes (Signed)
New admission to room 2W09. Patient is A&O*4. Able to verbalize her needs to staff. No CO pain ro discomfort at this time. Oxygen at 3L [place]. Heparin gtt at 17.73ml infusing. Redness to groin area noted. VS checked and cardiac monitor applied and CCMD notified. Bed kept in low position and locked. Call bell in reach. No further needs expressed at this time.

## 2021-05-21 NOTE — ED Notes (Signed)
Echo at bedside

## 2021-05-21 NOTE — ED Notes (Signed)
ED TO INPATIENT HANDOFF REPORT  ED Nurse Name and Phone #: megan [phone]  S Name/Age/Gender [name] 50 y.o. female Room/Bed: 017C/017C  Code Status   Code Status: Full Code  Home/SNF/Other Home Patient oriented to: self, place, time, and situation Is this baseline? Yes   Triage Complete: Triage complete  Chief Complaint Pulmonary embolism ([place]) [I26.99]  Triage Note Pt from home via [place] for c/o [facility]. Hx CHF, anxiety, resp failure w hypoxia, CPAP at night. Walked to kitchen from bed to take nighttime meds, severe SHOB- "nothing like this has ever happened before except when originally diagnosed w CHF." Denies CP, lung sounds clear bilaterally.   142/66 HR 130    Allergies Allergies  Allergen Reactions   Metoclopramide Other (See Comments)    Developed restless leg, akathisia type limb movements.    Codeine Itching, Rash and Other (See Comments)   Hydrocodone Rash   Penicillins Itching    Tolerated Unasyn 02/01/2020   Percocet [Oxycodone-Acetaminophen] Hives and Rash    Tolerates dilaudid Tolerates acetaminophen     Level of Care/Admitting Diagnosis ED Disposition     ED Disposition  Admit   Condition  --   [facility]: [place] [[id number]]  Level of Care: Progressive [102]  Admit to Progressive based on following criteria: RESPIRATORY PROBLEMS hypoxemic/hypercapnic respiratory failure that is responsive to NIPPV (BiPAP) or High Flow Nasal Cannula (6-80 lpm). Frequent assessment/intervention, no > Q2 hrs < Q4 hrs, to maintain oxygenation and pulmonary hygiene.  May admit patient to [facility] or [facility] if equivalent level of care is available:: No  Covid Evaluation: Confirmed COVID Negative  Diagnosis: Pulmonary embolism [facility]) [[id number]]  Admitting Physician: Axel Filler [id number]  Attending Physician: Axel Filler [[id number]]  Estimated length of stay: past midnight tomorrow  Certification:: I certify  this patient will need inpatient services for at least 2 midnights          B Medical/Surgery History Past Medical History:  Diagnosis Date   Acute renal failure ([place])     Intractable nausea vomiting secondary to diabetic gastroparesis causing dehydration and acute renal failure /notes 04/01/2013   Anginal pain ([place])    Anxiety    Bell's palsy 08/09/2010   Bicornuate uterus    CAP (community acquired pneumonia) 10/2011   Archie Endo 11/08/2011   Chest pain 01/13/2014   CHF (congestive heart failure) ([place])    Diabetic gastroparesis associated with type 2 diabetes mellitus ([place])    this is presumed diagnoses, not confirmed by any studies.    Eczema    Family history of malignant neoplasm of breast    Family history of malignant neoplasm of ovary    GERD (gastroesophageal reflux disease)    High cholesterol    Hypertension    Liver lesion 08/13/2019   Liver mass 2014   biopsied 03/2013 at [facility], not malignant.  is to undergo radiologic ablation of the mass in sept/October 2014.    Lupus ([place])    Migraines    "maybe a couple times/yr" (04/01/2013)   Obesity    Obstructive sleep apnea on CPAP 2011   Oxygen at nights   SJOGREN'S SYNDROME 08/09/2010   SLE (systemic lupus erythematosus) ([place])    Archie Endo 12/01/2010   Stroke ([place]) 2010; 10/2012   "left side is still weak from it, never fully regained full strength; no additions from stroke 10/2012"   Past Surgical History:  Procedure Laterality Date   BREAST CYST  EXCISION Left 08/2005   epidermoid   CARDIAC CATHETERIZATION  02/07/12   ECTOPIC PREGNANCY SURGERY  1999   ECTOPIC PREGNANCY SURGERY  1999   ESOPHAGOGASTRODUODENOSCOPY N/A 02/26/2013   Procedure: ESOPHAGOGASTRODUODENOSCOPY (EGD);  Surgeon: Irene Shipper, MD;  Location: [facility] ENDOSCOPY;  Service: Endoscopy;  Laterality: N/A;   ESOPHAGOGASTRODUODENOSCOPY N/A 03/02/2015   Procedure: ESOPHAGOGASTRODUODENOSCOPY (EGD);  Surgeon: Milus Banister, MD;  Location: [place];  Service:  Endoscopy;  Laterality: N/A;   ESOPHAGOGASTRODUODENOSCOPY N/A 02/23/2021   Procedure: ESOPHAGOGASTRODUODENOSCOPY (EGD);  Surgeon: Wilford Corner, MD;  Location: [place];  Service: Endoscopy;  Laterality: N/A;  possible dilation   HERNIA REPAIR     LEFT AND RIGHT HEART CATHETERIZATION WITH CORONARY ANGIOGRAM N/A 02/07/2012   Procedure: LEFT AND RIGHT HEART CATHETERIZATION WITH CORONARY ANGIOGRAM;  Surgeon: Laverda Page, MD;  Location: [facility] CATH LAB;  Service: Cardiovascular;  Laterality: N/A;   LIVER BIOPSY  03/2013   liver mass/medical hx noted above   MUSCLE BIOPSY     for lupus/notes 11/04/6254   UMBILICAL HERNIA REPAIR  1980's     A IV Location/Drains/Wounds Patient Lines/Drains/Airways Status     Active Line/Drains/Airways     Name Placement date Placement time Site Days   Peripheral IV 05/21/21 20 G 2.5" Left;Upper Arm 05/21/21  0641  Arm  less than 1   Pressure Injury 08/29/19 Buttocks Left Stage 2 -  Partial thickness loss of dermis presenting as a shallow open injury with a red, pink wound bed without slough. pink, open area 08/29/19  2157  -- 631   Wound / Incision (Open or Dehisced) 08/23/19 Non-pressure wound Breast Left;Right 08/23/19  2030  Breast  637   Wound / Incision (Open or Dehisced) 08/28/19 Non-pressure wound Sternum Lower;Mid 08/28/19  1840  Sternum  632            Intake/Output Last 24 hours No intake or output data in the 24 hours ending 05/21/21 1518  Labs/Imaging Results for orders placed or performed during the hospital encounter of 05/21/21 (from the past 48 hour(s))  Comprehensive metabolic panel     Status: Abnormal   Collection Time: 05/21/21  2:08 AM  Result Value Ref Range   Sodium 138 135 - 145 mmol/L   Potassium 4.0 3.5 - 5.1 mmol/L   Chloride 107 98 - 111 mmol/L   CO2 23 22 - 32 mmol/L   Glucose, Bld 293 (H) 70 - 99 mg/dL    Comment: Glucose reference range applies only to samples taken after fasting for at least 8 hours.   BUN 8 6  - 20 mg/dL   Creatinine, Ser 1.12 (H) 0.44 - 1.00 mg/dL   Calcium 9.5 8.9 - 10.3 mg/dL   Total Protein 6.4 (L) 6.5 - 8.1 g/dL   Albumin 3.3 (L) 3.5 - 5.0 g/dL   AST 75 (H) 15 - 41 U/L   ALT 24 0 - 44 U/L   Alkaline Phosphatase 138 (H) 38 - 126 U/L   Total Bilirubin 0.6 0.3 - 1.2 mg/dL   GFR, Estimated 60 (L) >60 mL/min    Comment: (NOTE) Calculated using the CKD-EPI Creatinine Equation (2021)    Anion gap 8 5 - 15    Comment: Performed at [facility], [place] [address], [place], [place] [address]  Brain natriuretic peptide     Status: None   Collection Time: 05/21/21  2:08 AM  Result Value Ref Range   B Natriuretic Peptide 17.9 0.0 - 100.0 pg/mL  Comment: Performed at [facility], [place] [address] [place], [place] [address]  Troponin I (High Sensitivity)     Status: Abnormal   Collection Time: 05/21/21  2:08 AM  Result Value Ref Range   Troponin I (High Sensitivity) 128 (HH) <18 ng/L    Comment: CRITICAL RESULT CALLED TO, READ BACK BY AND VERIFIED WITH: MUNNETT [facility] 05/21/21 0416 WAYK Performed at [facility], [place] [address], [place], [place] [address]   CBC with Differential     Status: Abnormal   Collection Time: 05/21/21  2:08 AM  Result Value Ref Range   WBC 13.0 (H) 4.0 - 10.5 K/uL   RBC 4.63 3.87 - 5.11 MIL/uL   Hemoglobin 12.9 12.0 - 15.0 g/dL   HCT 41.8 36.0 - 46.0 %   MCV 90.3 80.0 - 100.0 fL   MCH 27.9 26.0 - 34.0 pg   MCHC 30.9 30.0 - 36.0 g/dL   RDW 14.6 11.5 - 15.5 %   Platelets 218 150 - 400 K/uL    Comment: CONSISTENT WITH PREVIOUS RESULT REPEATED TO VERIFY    nRBC 0.0 0.0 - 0.2 %   Neutrophils Relative % 67 %   Neutro Abs 8.9 (H) 1.7 - 7.7 K/uL   Lymphocytes Relative 19 %   Lymphs Abs 2.5 0.7 - 4.0 K/uL   Monocytes Relative 7 %   Monocytes Absolute 0.9 0.1 - 1.0 K/uL   Eosinophils Relative 5 %   Eosinophils Absolute 0.6 (H) 0.0 - 0.5 K/uL   Basophils Relative 1 %   Basophils Absolute 0.1 0.0 - 0.1 K/uL   Immature Granulocytes 1  %   Abs Immature Granulocytes 0.08 (H) 0.00 - 0.07 K/uL    Comment: Performed at [facility], [place] [address] [place], [place] [address]  TSH     Status: None   Collection Time: 05/21/21  2:08 AM  Result Value Ref Range   TSH 2.871 0.350 - 4.500 uIU/mL    Comment: Performed by a 3rd Generation assay with a functional sensitivity of <=0.01 uIU/mL. Performed at [facility], [place] [address] [place], [place] [address]   Resp Panel by RT-PCR (Flu A&B, Covid) Nasopharyngeal Swab     Status: None   Collection Time: 05/21/21  2:46 AM   Specimen: Nasopharyngeal Swab; Nasopharyngeal(NP) swabs in vial transport medium  Result Value Ref Range   SARS Coronavirus 2 by RT PCR NEGATIVE NEGATIVE    Comment: (NOTE) SARS-CoV-2 target nucleic acids are NOT DETECTED.  The SARS-CoV-2 RNA is generally detectable in upper respiratory specimens during the acute phase of infection. The lowest concentration of SARS-CoV-2 viral copies this assay can detect is 138 copies/mL. A negative result does not preclude SARS-Cov-2 infection and should not be used as the sole basis for treatment or other patient management decisions. A negative result may occur with  improper specimen collection/handling, submission of specimen other than nasopharyngeal swab, presence of viral mutation(s) within the areas targeted by this assay, and inadequate number of viral copies(<138 copies/mL). A negative result must be combined with clinical observations, patient history, and epidemiological information. The expected result is Negative.  Fact Sheet for Patients:  EntrepreneurPulse.com.au  Fact Sheet for Healthcare Providers:  IncredibleEmployment.be  This test is no t yet approved or cleared by the [place] FDA and  has been authorized for detection and/or diagnosis of SARS-CoV-2 by FDA under an Emergency Use Authorization (EUA). This EUA will remain  in effect (meaning  this  test can be used) for the duration of the COVID-19 declaration under Section 564(b)(1) of the Act, 21 U.S.C.section 360bbb-3(b)(1), unless the authorization is terminated  or revoked sooner.       Influenza A by PCR NEGATIVE NEGATIVE   Influenza B by PCR NEGATIVE NEGATIVE    Comment: (NOTE) The Xpert Xpress SARS-CoV-2/FLU/RSV plus assay is intended as an aid in the diagnosis of influenza from Nasopharyngeal swab specimens and should not be used as a sole basis for treatment. Nasal washings and aspirates are unacceptable for Xpert Xpress SARS-CoV-2/FLU/RSV testing.  Fact Sheet for Patients: EntrepreneurPulse.com.au  Fact Sheet for Healthcare Providers: IncredibleEmployment.be  This test is not yet approved or cleared by the [place] FDA and has been authorized for detection and/or diagnosis of SARS-CoV-2 by FDA under an Emergency Use Authorization (EUA). This EUA will remain in effect (meaning this test can be used) for the duration of the COVID-19 declaration under Section 564(b)(1) of the Act, 21 U.S.C. section 360bbb-3(b)(1), unless the authorization is terminated or revoked.  Performed at [facility], [place] [address], [place], [place] [address]   Troponin I (High Sensitivity)     Status: Abnormal   Collection Time: 05/21/21  4:08 AM  Result Value Ref Range   Troponin I (High Sensitivity) 175 (HH) <18 ng/L    Comment: CRITICAL VALUE NOTED.  VALUE IS CONSISTENT WITH PREVIOUSLY REPORTED AND CALLED VALUE. (NOTE) Elevated high sensitivity troponin I (hsTnI) values and significant  changes across serial measurements may suggest ACS but many other  chronic and acute conditions are known to elevate hsTnI results.  Refer to the Links section for chest pain algorithms and additional  guidance. Performed at [facility], [place] [address] [place], [place] [address]   APTT     Status: None   Collection Time: 05/21/21  8:10  AM  Result Value Ref Range   aPTT 24 24 - 36 seconds    Comment: Performed at [place] [address], [place], [place] [address]  Protime-INR     Status: None   Collection Time: 05/21/21  8:10 AM  Result Value Ref Range   Prothrombin Time 13.5 11.4 - 15.2 seconds   INR 1.0 0.8 - 1.2    Comment: (NOTE) INR goal varies based on device and disease states. Performed at [facility], [place] [address], [place], [place] [address]    DG Chest 2 View  Result Date: 05/21/2021 CLINICAL DATA:  Shortness of breath EXAM: CHEST - 2 VIEW COMPARISON:  02/21/2021 FINDINGS: Lungs are clear.  No pleural effusion or pneumothorax. Mild cardiomegaly. Visualized osseous structures are within normal limits. IMPRESSION: Mild cardiomegaly. No evidence of acute cardiopulmonary disease. Electronically Signed   By: [name] Hy M.D.   On: 05/21/2021 02:33   CT Angio Chest PE W and/or Wo Contrast  Result Date: 05/21/2021 CLINICAL DATA:  Concern for pulmonary embolism. EXAM: CT ANGIOGRAPHY CHEST WITH CONTRAST TECHNIQUE: Multidetector CT imaging of the chest was performed using the standard protocol during bolus administration of intravenous contrast. Multiplanar CT image reconstructions and MIPs were obtained to evaluate the vascular anatomy. CONTRAST:  138mL OMNIPAQUE IOHEXOL 350 MG/ML SOLN COMPARISON:  None. FINDINGS: Cardiovascular: Tubular filling defects within the proximal segmental pulmonary arteries of the RIGHT lower lobe and RIGHT middle lobe as well as the RIGHT upper lobe. These emboli are occlusive and partially occlusive. Similar tubular filling defects within the proximal segmental pulmonary arteries of the LEFT lower lobe  and lingula. Thromboemboli are occlusive in the lingula and partially occlusive in the LEFT lower lobe. There is evidence of RIGHT ventricular strain with the RV to LV ratio (4.6:4.0) = 1.15 Mediastinum/Nodes: No axillary or supraclavicular adenopathy. No mediastinal or  hilar adenopathy. No pericardial fluid. Esophagus normal. Lungs/Pleura: No pulmonary infarction. Upper Abdomen: Limited view of the liver, kidneys, pancreas are unremarkable. Normal adrenal glands. Musculoskeletal: No acute osseous abnormality. Review of the MIP images confirms the above findings. IMPRESSION: 1. Bilateral occlusive acute pulmonary emboli within the proximal segmental pulmonary arteries. Overall clot burden is severe. Evidence of RIGHT ventricular strain (RV/LV Ratio = 1.15) consistent with at least submassive (intermediate risk) PE. The presence of right heart strain has been associated with an increased risk of morbidity and mortality. Please refer to the "PE Focused" order set in EPIC. 2. No pulmonary infarction. Critical Value/emergent results were called by telephone at the time of interpretation on 05/21/2021 at 7:23 am to provider Ronnald Nian, MD, who verbally acknowledged these results. Electronically Signed   By: Suzy Bouchard M.D.   On: 05/21/2021 07:24   ECHOCARDIOGRAM COMPLETE  Result Date: 05/21/2021    ECHOCARDIOGRAM REPORT   Patient Name:   [name] Date of Exam: 05/21/2021 Medical Rec #:  [id number]     Height:       68.0 in Accession #:    [id number]    Weight:       351.9 lb Date of Birth:  1971-02-03     BSA:          2.598 m Patient Age:    66 years      BP:           98/86 mmHg Patient Gender: F             HR:           110 bpm. Exam Location:  Inpatient Procedure: 2D Echo, Cardiac Doppler, Color Doppler and Intracardiac            Opacification Agent Indications:    Pulmonary [place]olis  History:        Patient has prior history of Echocardiogram examinations. CHF;                 Risk Factors:Diabetes and Hypertension.  Sonographer:    Danne Baxter RDCS, FE, PE Referring Phys: [id number] ADAM CURATOLO IMPRESSIONS  1. Left ventricular ejection fraction, by estimation, is 30 to 35%. The left ventricle has moderately decreased function. The left ventricle demonstrates global  hypokinesis. There is mild left ventricular hypertrophy. Left ventricular diastolic parameters are consistent with Grade I diastolic dysfunction (impaired relaxation). There is the interventricular septum is flattened in systole and diastole, consistent with right ventricular pressure and volume overload.  2. Right ventricular systolic function is severely reduced. The right ventricular size is normal.  3. The mitral valve is normal in structure. Trivial mitral valve regurgitation. No evidence of mitral stenosis.  4. The aortic valve is tricuspid. Aortic valve regurgitation is not visualized. No aortic stenosis is present. FINDINGS  Left Ventricle: Left ventricular ejection fraction, by estimation, is 30 to 35%. The left ventricle has moderately decreased function. The left ventricle demonstrates global hypokinesis. Definity contrast agent was given IV to delineate the left ventricular endocardial borders. The left ventricular internal cavity size was normal in size. There is mild left ventricular hypertrophy. The interventricular septum is flattened in systole and diastole, consistent with right ventricular pressure and volume overload. Left ventricular diastolic parameters  are consistent with Grade I diastolic dysfunction (impaired relaxation). Right Ventricle: The right ventricular size is normal. Right ventricular systolic function is severely reduced. Left Atrium: Left atrial size was normal in size. Right Atrium: Right atrial size was normal in size. Pericardium: There is no evidence of pericardial effusion. Mitral Valve: The mitral valve is normal in structure. Trivial mitral valve regurgitation. No evidence of mitral valve stenosis. Tricuspid Valve: The tricuspid valve is normal in structure. Tricuspid valve regurgitation is mild . No evidence of tricuspid stenosis. Aortic Valve: The aortic valve is tricuspid. Aortic valve regurgitation is not visualized. No aortic stenosis is present. Pulmonic Valve: The  pulmonic valve was normal in structure. Pulmonic valve regurgitation is trivial. No evidence of pulmonic stenosis. Aorta: The aortic root is normal in size and structure. Venous: The inferior vena cava was not well visualized. IAS/Shunts: The interatrial septum was not well visualized.  LEFT VENTRICLE PLAX 2D LVIDd:         5.30 cm   Diastology LVIDs:         4.00 cm   LV e' medial:    7.22 cm/s LV PW:         1.10 cm   LV E/e' medial:  1.0 LV IVS:        1.20 cm   LV e' lateral:   5.51 cm/s LVOT diam:     2.40 cm   LV E/e' lateral: 1.3 LVOT Area:     4.52 cm  RIGHT VENTRICLE RV S prime:     6.89 cm/s TAPSE (M-mode): 1.8 cm LEFT ATRIUM           Index        RIGHT ATRIUM           Index LA diam:      3.10 cm 1.19 cm/m   RA Area:     22.00 cm LA Vol (A4C): 42.9 ml 16.51 ml/m  RA Volume:   69.70 ml  26.83 ml/m   AORTA Ao Root diam: 3.20 cm Ao Asc diam:  3.20 cm MV E velocity: 6.89 cm/s  TRICUSPID VALVE                           TR Peak grad:   31.6 mmHg                           TR Vmax:        281.00 cm/s                            SHUNTS                           Systemic Diam: 2.40 cm Kirk Ruths MD Electronically signed by Kirk Ruths MD Signature Date/Time: 05/21/2021/2:40:40 PM    Final     Pending Labs Unresulted Labs (From admission, onward)     Start     Ordered   05/22/21 0500  Heparin level (unfractionated)  Daily,   R      05/21/21 0801   05/22/21 0500  CBC  Daily,   R      05/21/21 0801   05/22/21 0500  Comprehensive metabolic panel  Tomorrow morning,   R        05/21/21 0906   05/21/21 1500  Heparin level (unfractionated)  Once-Timed,   STAT        05/21/21 0801   05/21/21 0906  Hemoglobin A1c  Once,   R        05/21/21 0906            Vitals/Pain Today's Vitals   05/21/21 1015 05/21/21 1035 05/21/21 1139 05/21/21 1306  BP:  99/66 108/77 124/81  Pulse: (!) 119 (!) 120 (!) 119 (!) 113  Resp: (!) 21 (!) 22 (!) 22 (!) 25  Temp:      TempSrc:      SpO2: 99% 100% 97%  97%  Weight:      Height:      PainSc:        Isolation Precautions No active isolations  Medications Medications  albuterol (VENTOLIN HFA) 108 (90 Base) MCG/ACT inhaler 2 puff (2 puffs Inhalation Given 05/21/21 0720)  heparin ADULT infusion 100 units/mL (25000 units/273mL) (1,750 Units/hr Intravenous New Bag/Given 05/21/21 0825)  perflutren lipid microspheres (DEFINITY) IV suspension (1.5 mLs Intravenous Given 05/21/21 1211)  ALPRAZolam (XANAX) tablet 0.5 mg (has no administration in time range)  azaTHIOprine (IMURAN) tablet 150 mg (has no administration in time range)  pravastatin (PRAVACHOL) tablet 10 mg (has no administration in time range)  cromolyn (OPTICROM) 4 % ophthalmic solution 1 drop (has no administration in time range)  cycloSPORINE (RESTASIS) 0.05 % ophthalmic emulsion 1 drop (has no administration in time range)  ALPRAZolam (XANAX) tablet 0.5 mg (0.5 mg Oral Given 05/21/21 0244)  iohexol (OMNIPAQUE) 350 MG/ML injection 100 mL (100 mLs Intravenous Contrast Given 05/21/21 0704)  heparin bolus via infusion 7,500 Units (7,500 Units Intravenous Bolus from Bag 05/21/21 0826)    Mobility walks Low fall risk   Focused Assessments  R Recommendations: See Admitting Provider Note  Report given to:   Additional Notes:

## 2021-05-21 NOTE — ED Triage Notes (Signed)
Pt from home via [facility] for c/o [facility]. Hx CHF, anxiety, resp failure w hypoxia, CPAP at night. Walked to kitchen from bed to take nighttime meds, severe SHOB- "nothing like this has ever happened before except when originally diagnosed w CHF." Denies CP, lung sounds clear bilaterally.   142/66 HR 130

## 2021-05-21 NOTE — Consult Note (Signed)
NAME:  [name], MRN:  [id number], DOB:  November 07, 1970, LOS: 0 ADMISSION DATE:  05/21/2021, CONSULTATION DATE: 05/21/2021 REFERRING MD: Emergency department physician, CHIEF COMPLAINT: PE with right heart strain  History of Present Illness:  50 year old female who has a plethora of health issues including autoimmune disease with lupus is steroid-dependent, insulin-dependent diabetic, gastroesophageal reflux disease, obstructive sleep apnea, congestive heart failure, history of renal failure, stroke, obesity eczema, Bell's palsy, diastolic congestive heart failure who was in her usual state of poor health until 05/20/2021 at which time she developed acute shortness of breath.  She presented to the emergency room CT showed pulmonary embolism with right heart strain pulmonary critical care asked to evaluate  Pertinent  Medical History   Past Medical History:  Diagnosis Date   Acute renal failure (HCC)     Intractable nausea vomiting secondary to diabetic gastroparesis causing dehydration and acute renal failure /notes 04/01/2013   Anginal pain ([place])    Anxiety    Bell's palsy 08/09/2010   Bicornuate uterus    CAP (community acquired pneumonia) 10/2011   Archie Endo 11/08/2011   Chest pain 01/13/2014   CHF (congestive heart failure) ([place])    Diabetic gastroparesis associated with type 2 diabetes mellitus ([place])    this is presumed diagnoses, not confirmed by any studies.    Eczema    Family history of malignant neoplasm of breast    Family history of malignant neoplasm of ovary    GERD (gastroesophageal reflux disease)    High cholesterol    Hypertension    Liver lesion 08/13/2019   Liver mass 2014   biopsied 03/2013 at [facility], not malignant.  is to undergo radiologic ablation of the mass in sept/October 2014.    Lupus ([place])    Migraines    "maybe a couple times/yr" (04/01/2013)   Obesity    Obstructive sleep apnea on CPAP 2011   Oxygen at nights   SJOGREN'S SYNDROME 08/09/2010   SLE  (systemic lupus erythematosus) ([place])    Archie Endo 12/01/2010   Stroke ([place]) 2010; 10/2012   "left side is still weak from it, never fully regained full strength; no additions from stroke 10/2012"     Significant Hospital Events: Including procedures, antibiotic start and stop dates in addition to other pertinent events   Heparin drip 05/21/2021  Interim History / Subjective:  24 hours of acute shortness of breath on presentation to the ER positive for PE  Objective   Blood pressure 104/86, pulse (!) 136, temperature 98.1 F (36.7 C), temperature source Oral, resp. rate 15, height _0  (1.727 m), weight (!) 159.6 kg, last menstrual period 03/24/2017, SpO2 97 %.       No intake or output data in the 24 hours ending 05/21/21 0929 Filed Weights   05/21/21 0723  Weight: (!) 159.6 kg    Examination: General: Morbid obese female who is in no acute distress HENT: Neck but no JVD is appreciated Lungs: Breath sounds throughout Cardiovascular: Heart sounds are regular Abdomen: Obese soft nontender positive bowel sounds Extremities: Overall 1+ edema Neuro: Fully intact history of left hemiplegia not evident GU: Voids  Resolved Hospital Problem list     Assessment & Plan:  Acute shortness of breath of 24 hours duration with CT scan notable for acute pulmonary emboli with them the proximal segmental pulmonary arteries with severe clot burden.  Right ventricular strain ratio equals 1.15.  Currently on heparin drip she is on oxygen but she is on home oxygen  in no acute distress at rest.  She has a plethora of health problems that are well documented. 2.  Suffers from obstructive sleep apnea.  Agree with heparin drip Does not need need intensive care at this time Plus or minus lytics to be determined by pulmonary physician Per hospitalist service to admit  Hypertension,, Reflux Disease, Congestive Heart Failure, Obesity, insulin-dependent diabetic, lupus steroid-dependent all per  primary  Best Practice (right click and "Reselect all SmartList Selections" daily)   Diet/type: Regular consistency (see orders) DVT prophylaxis: systemic heparin GI prophylaxis: PPI Lines: N/A Foley:  N/A Code Status:  full code Last date of multidisciplinary goals of care discussion [tbd]  Labs   CBC: Recent Labs  Lab 05/21/21 0208  WBC 13.0*  NEUTROABS 8.9*  HGB 12.9  HCT 41.8  MCV 90.3  PLT 004    Basic Metabolic Panel: Recent Labs  Lab 05/21/21 0208  NA 138  K 4.0  CL 107  CO2 23  GLUCOSE 293*  BUN 8  CREATININE 1.12*  CALCIUM 9.5   GFR: Estimated Creatinine Clearance: 97 mL/min (A) (by C-G formula based on SCr of 1.12 mg/dL (H)). Recent Labs  Lab 05/21/21 0208  WBC 13.0*    Liver Function Tests: Recent Labs  Lab 05/21/21 0208  AST 75*  ALT 24  ALKPHOS 138*  BILITOT 0.6  PROT 6.4*  ALBUMIN 3.3*   No results for input(s): LIPASE, AMYLASE in the last 168 hours. No results for input(s): AMMONIA in the last 168 hours.  ABG    Component Value Date/Time   PHART 7.285 (L) 08/14/2019 0517   PCO2ART 38.9 08/14/2019 0517   PO2ART 88.3 08/14/2019 0517   HCO3 18.0 (L) 08/14/2019 0517   TCO2 19 (L) 02/18/2021 1851   ACIDBASEDEF 7.5 (H) 08/14/2019 0517   O2SAT 96.0 08/14/2019 0517     Coagulation Profile: Recent Labs  Lab 05/21/21 0810  INR 1.0    Cardiac Enzymes: No results for input(s): CKTOTAL, CKMB, CKMBINDEX, TROPONINI in the last 168 hours.  HbA1C: Hemoglobin A1C  Date/Time Value Ref Range Status  03/17/2021 07:58 AM 9.4 (A) 4.0 - 5.6 % Final  12/22/2020 01:29 PM 13.4 (A) 4.0 - 5.6 % Final   Hgb A1c MFr Bld  Date/Time Value Ref Range Status  02/24/2021 04:06 AM 9.9 (H) 4.8 - 5.6 % Final    Comment:    (NOTE)         Prediabetes: 5.7 - 6.4         Diabetes: >6.4         Glycemic control for adults with diabetes: <7.0   01/30/2020 12:13 AM 10.5 (H) 4.8 - 5.6 % Final    Comment:    (NOTE) Pre diabetes:           5.7%-6.4%  Diabetes:              >6.4%  Glycemic control for   <7.0% adults with diabetes     CBG: No results for input(s): GLUCAP in the last 168 hours.  Review of Systems:   10 point review of system taken, please see HPI for positives and negatives. 24 hours shortness of breath without sputum production fevers chills or sweats.  Denies any recent travels or injury to lower extremities   Past Medical History:  She,  has a past medical history of Acute renal failure ([place]), Anginal pain ([place]), Anxiety, Bell's palsy (08/09/2010), Bicornuate uterus, CAP (community acquired pneumonia) (10/2011), Chest pain (01/13/2014), CHF (congestive heart  failure) ([place]), Diabetic gastroparesis associated with type 2 diabetes mellitus ([place]), Eczema, Family history of malignant neoplasm of breast, Family history of malignant neoplasm of ovary, GERD (gastroesophageal reflux disease), High cholesterol, Hypertension, Liver lesion (08/13/2019), Liver mass (2014), Lupus ([place]), Migraines, Obesity, Obstructive sleep apnea on CPAP (2011), SJOGREN'S SYNDROME (08/09/2010), SLE (systemic lupus erythematosus) ([place]), and Stroke ([place]) (2010; 10/2012).   Surgical History:   Past Surgical History:  Procedure Laterality Date   BREAST CYST EXCISION Left 08/2005   epidermoid   CARDIAC CATHETERIZATION  02/07/12   ECTOPIC PREGNANCY SURGERY  1999   ECTOPIC PREGNANCY SURGERY  1999   ESOPHAGOGASTRODUODENOSCOPY N/A 02/26/2013   Procedure: ESOPHAGOGASTRODUODENOSCOPY (EGD);  Surgeon: Irene Shipper, MD;  Location[facility] ENDOSCOPY;  Service: Endoscopy;  Laterality: N/A;   ESOPHAGOGASTRODUODENOSCOPY N/A 03/02/2015   Procedure: ESOPHAGOGASTRODUODENOSCOPY (EGD);  Surgeon: Milus Banister, MD;  Location: [place];  Service: Endoscopy;  Laterality: N/A;   ESOPHAGOGASTRODUODENOSCOPY N/A 02/23/2021   Procedure: ESOPHAGOGASTRODUODENOSCOPY (EGD);  Surgeon: Wilford Corner, MD;  Location: [place];  Service: Endoscopy;  Laterality: N/A;  possible  dilation   HERNIA REPAIR     LEFT AND RIGHT HEART CATHETERIZATION WITH CORONARY ANGIOGRAM N/A 02/07/2012   Procedure: LEFT AND RIGHT HEART CATHETERIZATION WITH CORONARY ANGIOGRAM;  Surgeon: Laverda Page, MD;  Location: [facility] CATH LAB;  Service: Cardiovascular;  Laterality: N/A;   LIVER BIOPSY  03/2013   liver mass/medical hx noted above   MUSCLE BIOPSY     for lupus/notes 12/02/2990   UMBILICAL HERNIA REPAIR  1980's     Social History:   reports that she quit smoking about 29 years ago. Her smoking use included cigarettes. She has a 1.50 pack-year smoking history. She has never used smokeless tobacco. She reports that she does not drink alcohol and does not use drugs.   Family History:  Her family history includes Arthritis in an other family member; Asthma in her brother, brother, and mother; Breast cancer in her cousin and maternal aunt; Breast cancer (age of onset: 53) in her maternal aunt; Cancer in her maternal aunt, maternal aunt, and paternal aunt; Diabetes in her mother; Hypertension in her mother and another family member; Ovarian cancer in her maternal aunt; Ovarian cancer (age of onset: 71) in her sister; Prostate cancer in her maternal uncle and paternal uncle; Stomach cancer in her maternal uncle; Stroke in an other family member; Urticaria in her mother. There is no history of Allergic rhinitis or Angioedema.   Allergies Allergies  Allergen Reactions   Metoclopramide Other (See Comments)    Developed restless leg, akathisia type limb movements.    Codeine Itching, Rash and Other (See Comments)   Hydrocodone Rash   Penicillins Itching    Tolerated Unasyn 02/01/2020   Percocet [Oxycodone-Acetaminophen] Hives and Rash    Tolerates dilaudid Tolerates acetaminophen      Home Medications  Prior to Admission medications   Medication Sig Start Date End Date Taking? Authorizing Provider  ACCU-CHEK AVIVA PLUS test strip USE TO TEST BLOOD SUGAR 4 TIMES DAILY 05/05/21   Renato Shin,  MD  ADVAIR [facility] 230-21 MCG/ACT inhaler Inhale 1-2 puffs into the lungs 2 (two) times daily. 12/20/20   [provider]  albuterol (PROVENTIL) (2.5 MG/3ML) 0.083% nebulizer solution Take 2.5 mg by nebulization every 6 (six) hours as needed for wheezing.    [provider]  ALPRAZolam Duanne Moron) 0.5 MG tablet Take 0.5 mg 2 (two) times daily as needed by mouth for anxiety or sleep.  04/19/17  [provider]  amLODipine (NORVASC) 10 MG tablet Take 1 tablet (10 mg total) by mouth daily. 01/01/21   Adrian Prows, MD  ARIPiprazole (ABILIFY) 5 MG tablet Take 5 mg by mouth daily. 12/08/20   [provider]  aspirin EC 81 MG tablet Take 81 mg by mouth daily. Swallow whole.    [provider]  Azathioprine 75 MG TABS Take 150 mg by mouth 2 (two) times daily. 02/04/21   [provider]  B-D UF III MINI PEN NEEDLES 31G X 5 MM MISC USE AS DIRECTED TWICE A DAY 10/31/18   Renato Shin, MD  Belimumab (BENLYSTA) 200 MG/ML SOAJ Inject 200 mg into the skin once a week. Thursdays 12/19/19   [provider]  benzonatate (TESSALON) 100 MG capsule Take 1 capsule by mouth 3 (three) times daily as needed for cough. 05/23/19   [provider]  Blood Glucose Monitoring Suppl (ONETOUCH VERIO) w/Device KIT Test 4 times daily.  DX E11.9 04/27/20   Elayne Snare, MD  buPROPion (WELLBUTRIN XL) 300 MG 24 hr tablet Take 300 mg by mouth daily. 06/28/19   [provider]  carvedilol (COREG) 25 MG tablet Take 1.5 tablets (37.5 mg total) by mouth 2 (two) times daily with a meal. 01/01/21   Adrian Prows, MD  cetirizine (ZYRTEC) 10 MG tablet Take 10 mg by mouth daily as needed for allergies.     [provider]  clotrimazole-betamethasone (LOTRISONE) cream Apply 1 application topically 2 (two) times daily. 01/01/19   Renato Shin, MD  cromolyn (OPTICROM) 4 % ophthalmic solution Place 1 drop into both eyes 2 (two) times daily. 01/14/21   [provider]   cycloSPORINE (RESTASIS) 0.05 % ophthalmic emulsion Place 1 drop into both eyes in the morning, at noon, and at bedtime.     [provider]  diclofenac Sodium (VOLTAREN) 1 % GEL Apply 2 g topically 4 (four) times daily. 09/18/19   Love, Ivan Anchors, PA-C  DULoxetine (CYMBALTA) 60 MG capsule Take 120 mg by mouth at bedtime. 08/31/20   [provider]  EASY TOUCH PEN NEEDLES 32G X 4 MM MISC USE TO INJECT INSULIN TWICE DAILY 08/07/19   Renato Shin, MD  ENTRESTO 97-103 MG TAKE 1 TABLET BY MOUTH 2 (TWO) TIMES DAILY. 04/09/21   Adrian Prows, MD  famotidine (PEPCID) 20 MG tablet Take 20 mg by mouth 2 (two) times daily.    [provider]  fluticasone (FLONASE) 50 MCG/ACT nasal spray Place 2 sprays into both nostrils continuous as needed for allergies or rhinitis.    [provider]  glucose blood (ONETOUCH VERIO) test strip Test 4 times daily DX E11.9 04/27/20   Elayne Snare, MD  glucose blood test strip Test 4 times daily DX E11.9 04/27/20   Elayne Snare, MD  hydroxychloroquine (PLAQUENIL) 200 MG tablet Take 200 mg by mouth 2 (two) times daily.    [provider]  Insulin NPH, Human,, Isophane, (HUMULIN N KWIKPEN) 100 UNIT/ML Kiwkpen Inject 260 Units into the skin every morning. 03/30/21   Renato Shin, MD  Insulin Syringe-Needle U-100 (EASY TOUCH INSULIN SYRINGE) 31G X 5/16" 1 ML MISC USE AS DIRECTED THREE TIMES A DAY 10/12/18   Renato Shin, MD  isosorbide-hydrALAZINE (BIDIL) 20-37.5 MG tablet TAKE TWO (2) TABLETS BY MOUTH THREE TIMES DAILY 01/01/21   Adrian Prows, MD  Lancets [facility] ULTRASOFT) lancets Test 4 times daily DX E11.9 04/27/20   Elayne Snare, MD  levothyroxine (SYNTHROID) 50 MCG tablet Take 1  tablet (50 mcg total) by mouth daily at 6 (six) AM. 08/30/19   Raiford Noble Latif, DO  megestrol (MEGACE) 40 MG tablet Take 40 mg by mouth daily. 01/30/15   [provider]  metFORMIN (GLUCOPHAGE-XR) 500 MG 24 hr tablet Take 2 tablets (1,000 mg total) by mouth 2  (two) times daily. 02/02/21   Renato Shin, MD  Multiple Vitamin (MULTIVITAMIN WITH MINERALS) TABS tablet Take 1 tablet by mouth daily. 08/30/19   Raiford Noble Latif, DO  ondansetron (ZOFRAN ODT) 4 MG disintegrating tablet Take 1 tablet (4 mg total) by mouth every 8 (eight) hours as needed for nausea or vomiting. 02/25/21   Hosie Poisson, MD  oxybutynin (DITROPAN XL) 15 MG 24 hr tablet Take 15 mg by mouth daily.  08/30/17   [provider]  pantoprazole (PROTONIX) 40 MG tablet Take 1 tablet (40 mg total) by mouth 2 (two) times daily. 02/25/21   Hosie Poisson, MD  polyethylene glycol (MIRALAX / GLYCOLAX) 17 g packet Take 17 g by mouth daily. 02/12/21   Drenda Freeze, MD  pravastatin (PRAVACHOL) 10 MG tablet Take 1 tablet (10 mg total) by mouth daily after supper. 01/01/21   Adrian Prows, MD  predniSONE (DELTASONE) 1 MG tablet Take 1 mg by mouth 2 (two) times daily. 04/21/17   [provider]  pregabalin (LYRICA) 75 MG capsule Take 75 mg 3 (three) times daily by mouth.  01/28/15   [provider]  QUEtiapine (SEROQUEL) 300 MG tablet Take 300 mg by mouth at bedtime. 01/08/21   [provider]  spironolactone (ALDACTONE) 50 MG tablet Take 1 tablet (50 mg total) by mouth daily. 01/01/21   Adrian Prows, MD  sucralfate (CARAFATE) 1 GM/10ML suspension Take 10 mLs by mouth 2 (two) times daily. 12/09/20   [provider]  topiramate (TOPAMAX) 25 MG tablet Take 75 mg by mouth at bedtime. 03/17/16   [provider]  torsemide (DEMADEX) 20 MG tablet Take 20 mg by mouth daily as needed for fluid. 09/12/19   [provider]  VICTOZA 18 MG/3ML SOPN INJECT 1.8 MG UNDER THE SKIN ONCE DAILY IN THE MORNING 05/05/21   Renato Shin, MD     Critical care time:     Richardson Landry Meshilem Machuca ACNP Acute Care Nurse Practitioner [place] Please consult Amion 05/21/2021, 9:30 AM

## 2021-05-21 NOTE — Plan of Care (Signed)

## 2021-05-21 NOTE — ED Provider Notes (Addendum)
Patient signed out to me at 7 AM.  Awaiting CT scan of her chest to rule out PE.  Acute onset of shortness of breath overnight.  She sinus tachycardia in the 130s upon arrival.  New hypoxia requiring 4 L of oxygen nasal cannula.  COVID test is negative.  Troponin is elevated x2.  128-175.  Lab work otherwise unremarkable.  Chest x-ray with no evidence of volume overload or pneumonia.  CT scan shows bilateral occlusive acute pulmonary emboli within proximal segmental pulmonary arteries.  Overall the clot burden is severe per radiologist over the phone.  There is evidence of right heart strain consistent with at least submassive PE.  We will start patient on heparin bolus and infusion.  We will make pulmonology aware and admit to hospitalist service.  Will need echocardiogram and closer monitoring.  This chart was dictated using voice recognition software.  Despite best efforts to proofread,  errors can occur which can change the documentation meaning.    Lennice Sites, DO 05/21/21 1157  .Critical Care Performed by: Lennice Sites, DO Authorized by: Lennice Sites, DO   Critical care provider statement:    Critical care time (minutes):  35   Critical care was necessary to treat or prevent imminent or life-threatening deterioration of the following conditions:  Respiratory failure   Critical care was time spent personally by me on the following activities:  Blood draw for specimens, development of treatment plan with patient or surrogate, discussions with consultants, discussions with primary provider, evaluation of patient's response to treatment, obtaining history from patient or surrogate, ordering and performing treatments and interventions, ordering and review of laboratory studies, ordering and review of radiographic studies, pulse oximetry and re-evaluation of patient's condition   I assumed direction of critical care for this patient from another provider in my specialty: yes     Care  discussed with: admitting provider      Lennice Sites, [place] 05/21/21 [phone]

## 2021-05-22 ENCOUNTER — Inpatient Hospital Stay (HOSPITAL_COMMUNITY): Payer: Medicaid Other

## 2021-05-22 DIAGNOSIS — J9601 Acute respiratory failure with hypoxia: Secondary | ICD-10-CM | POA: Diagnosis not present

## 2021-05-22 DIAGNOSIS — E662 Morbid (severe) obesity with alveolar hypoventilation: Secondary | ICD-10-CM | POA: Diagnosis not present

## 2021-05-22 DIAGNOSIS — I2699 Other pulmonary embolism without acute cor pulmonale: Secondary | ICD-10-CM

## 2021-05-22 DIAGNOSIS — I2609 Other pulmonary embolism with acute cor pulmonale: Secondary | ICD-10-CM

## 2021-05-22 LAB — HEMOGLOBIN A1C
Hgb A1c MFr Bld: 12.9 % — ABNORMAL HIGH (ref 4.8–5.6)
Mean Plasma Glucose: 323.53 mg/dL

## 2021-05-22 LAB — COMPREHENSIVE METABOLIC PANEL
ALT: 14 U/L (ref 0–44)
AST: 17 U/L (ref 15–41)
Albumin: 2.8 g/dL — ABNORMAL LOW (ref 3.5–5.0)
Alkaline Phosphatase: 119 U/L (ref 38–126)
Anion gap: 9 (ref 5–15)
BUN: 11 mg/dL (ref 6–20)
CO2: 22 mmol/L (ref 22–32)
Calcium: 8.7 mg/dL — ABNORMAL LOW (ref 8.9–10.3)
Chloride: 108 mmol/L (ref 98–111)
Creatinine, Ser: 0.95 mg/dL (ref 0.44–1.00)
GFR, Estimated: >60 mL/min (ref >60)
Glucose, Bld: 250 mg/dL — ABNORMAL HIGH (ref 70–99)
Potassium: 3.6 mmol/L (ref 3.5–5.1)
Sodium: 139 mmol/L (ref 135–145)
Total Bilirubin: 0.4 mg/dL (ref 0.3–1.2)
Total Protein: 5.9 g/dL — ABNORMAL LOW (ref 6.5–8.1)

## 2021-05-22 LAB — CBC
HCT: 37.7 % (ref 36.0–46.0)
Hemoglobin: 12.2 g/dL (ref 12.0–15.0)
MCH: 28.3 pg (ref 26.0–34.0)
MCHC: 32.4 g/dL (ref 30.0–36.0)
MCV: 87.5 fL (ref 80.0–100.0)
Platelets: 211 10*3/uL (ref 150–400)
RBC: 4.31 MIL/uL (ref 3.87–5.11)
RDW: 14.9 % (ref 11.5–15.5)
WBC: 10.8 10*3/uL — ABNORMAL HIGH (ref 4.0–10.5)
nRBC: 0 % (ref 0.0–0.2)

## 2021-05-22 LAB — LIPID PANEL
Cholesterol: 203 mg/dL — ABNORMAL HIGH (ref 0–200)
HDL: 58 mg/dL (ref >40)
LDL Cholesterol: 117 mg/dL — ABNORMAL HIGH (ref 0–99)
Total CHOL/HDL Ratio: 3.5 RATIO
Triglycerides: 138 mg/dL (ref <150)
VLDL: 28 mg/dL (ref 0–40)

## 2021-05-22 LAB — HEPARIN LEVEL (UNFRACTIONATED): Heparin Unfractionated: 0.57 IU/mL (ref 0.30–0.70)

## 2021-05-22 LAB — GLUCOSE, CAPILLARY
Glucose-Capillary: 201 mg/dL — ABNORMAL HIGH (ref 70–99)
Glucose-Capillary: 203 mg/dL — ABNORMAL HIGH (ref 70–99)
Glucose-Capillary: 286 mg/dL — ABNORMAL HIGH (ref 70–99)
Glucose-Capillary: 329 mg/dL — ABNORMAL HIGH (ref 70–99)
Glucose-Capillary: 271 mg/dL — ABNORMAL HIGH (ref 70–99)
Glucose-Capillary: 361 mg/dL — ABNORMAL HIGH (ref 70–99)
Glucose-Capillary: 246 mg/dL — ABNORMAL HIGH (ref 70–99)

## 2021-05-22 LAB — HEMOGLOBIN AND HEMATOCRIT, BLOOD
HCT: 39 % (ref 36.0–46.0)
Hemoglobin: 12.3 g/dL (ref 12.0–15.0)

## 2021-05-22 MED ORDER — NAPROXEN 250 MG PO TABS
500.0000 mg | ORAL_TABLET | Freq: Once | ORAL | Status: AC
  Administered 2021-05-22: 500 mg via ORAL
  Filled 2021-05-22: qty 2

## 2021-05-22 MED ORDER — LEVOTHYROXINE SODIUM 50 MCG PO TABS
50.0000 ug | ORAL_TABLET | Freq: Every day | ORAL | Status: DC
  Administered 2021-05-23 – 2021-05-27 (×5): 50 ug via ORAL
  Filled 2021-05-22 (×5): qty 1

## 2021-05-22 MED ORDER — LIDOCAINE 5 % EX PTCH
1.0000 | MEDICATED_PATCH | CUTANEOUS | Status: DC
  Administered 2021-05-22 – 2021-05-26 (×5): 1 via TRANSDERMAL
  Filled 2021-05-22 (×5): qty 1

## 2021-05-22 NOTE — Progress Notes (Signed)
Pt refusing to use Bipap. Pt educated. Will continue to monitor.

## 2021-05-22 NOTE — Progress Notes (Signed)
[name] is a 50 y.o. female with a pertinent PMH of lupus, Sjogren's syndrome, congestive heart failure, type 2 diabetes, HLD, HTN, obesity, OSA, and stroke, who presents to [facility] with shortness of breath found to have a pulmonary embolism.   Subjective:  Overnight:continues to have shortness of breath without oxygen and endorsed chest tightness and left side pain.   [name] was resting in bed comfortably eating breakfast. Denies wearing oxygen at home regularly. She uses a cane and walker at home to help her with ambulation since her stroke.   She had two pregnancies but has one child as one which was ectopic.   States she wears CPAP nightly  Objective:  Vital signs in last 24 hours: Vitals:   05/21/21 1931 05/21/21 2330 05/22/21 0014 05/22/21 0344  BP: (!) 145/89 126/82  130/78  Pulse:  (!) 109 (!) 106   Resp:      Temp:    98.1 F (36.7 C)  TempSrc:    Oral  SpO2: 99% 100% 99%   Weight:      Height:       Supplemental O2: [place] SpO2: 99 % O2 Flow Rate (L/min): 3 L/min  Physical Exam General: NAD Head: Normocephalic without scalp lesions.  Eyes: Conjunctivae pink, sclerae white, without jaundice.  Mouth: Moist mucus membrane. Neck: Neck supple with full range of motion (ROM).  Lungs: CTAB, no wheeze, rhonchi or rales.  Cardiovascular: tachycardic, regular rhythm.  Abdomen: TTP in left flank, no TTP in the abdomen, normal bowel sounds MSK: No asymmetry or muscle atrophy. No LE edema noted. 5/5 right sided strength with good right hand grip. 4/5 Left sided strength with weaker grip compared to the right.  Skin: warm, dry good skin turgor, no lesions Neuro: Alert and oriented. CN grossly intact Psych: Normal mood and normal affect   Filed Weights   05/21/21 0723  Weight: (!) 159.6 kg     Intake/Output Summary (Last 24 hours) at 05/22/2021 4008 Last data filed at 05/21/2021 1645 Gross per 24 hour  Intake 218.4 ml  Output --  Net 218.4 ml   Net IO Since  Admission: 218.4 mL [05/22/21 0711]  Assessment/Plan:  DONYAE [name] is a 50 y.o. female with a pertinent PMH of lupus, Sjogren's syndrome, congestive heart failure, type 2 diabetes, HLD, HTN, obesity, OSA, and stroke, who presents to [facility] with shortness of breath found to have a pulmonary embolism.   Plan: Submassive PE Lupus: Patient presented with shortness of breath and found to have a submassive PE with Pesi score of 102 that she has had intermittent risk of 30-day mortality.  This is considered as an unprovoked PE. Does have a history of lupus, and is currently taking hydroxychloroquine, weekly Benlysta (on Thursdays), and prednisone. She was previously on Plavix, but this was discontinued in the work-up of the liver mass and was never restarted per patient although her last note states she is on Plavix.  Her tachycardia has improved and she is satting >99 on 3 L. Doppler negative for DVT in bilateral LE.  - Appreciate PCCM's recommendations and assessment for lytics. - Continue heparin per pharmacy; will eventually switch to oral AC  - Continue hydroxychloroquine 200 mg BID, prednisone 2 mg qd, and azathioprine 150 mg BID   HFmrEF HTN Last echo 6//2022 showing ejection fraction of 45 to 50%, with mild ventricular agitation with moderate left ventricular hypertrophy.  Echo 10/22 showed worsening of EF to 30 to 35% most likely  in setting of PE. Medications include Entresto, Coreg, spironolactone, BiDil, and Norvasc.  We will currently hold these medications in the setting of submassive PE. -Follow with cardiology outpatient for repeat Echo after resolution of PE.    Hypothyroidism -Levothyroxine 50 MCG   Type 2 DM Her home regimen metformin is 1000 mg twice daily and B20 1.8 mg daily. A1c 12.9 05/22/21. -Start SSI   Hepatic Adenoma Patient has hx of hepatic adenoma complicated by hemoperitoneum in January 2021 and had emobolization at [facility]. It appears her Plavix was stopped when she was  actively bleeding but later note does suggest this was restarted as DC summary states her stroke was remote. Although it does appear as an active medication in her last PCP note. She states she was told to hold off taking Plavix but that was never taking off her medication list. She was never told to start again. She follows with Dr. Ned Card for her hepatic masses at [facility]. Hepatic adenoma first noted in 2014. Per Dr. Pecola Lawless notes "CT of the abdomen dated January 21, 2021 demonstrates interval decrease in size of mildly hyperenhancing lesion in segment 5 compared to prior imaging from December 2021. No other lesions are identified."  He recommended follow up in 1 year for repeat imaging.  -Continue to monitor   HLD -Pravastatin 10 mg   GERD -Pepcid 20 mg   OSA Sleep study at [place] years ago, she is unsure as to when this was done. Wears her CPAP at least 4 hours every night.  -CPAP at night  Hx of Stroke Patient has hx of stroke in 2011, 2014, 2015. -Home med aspirin 81 mg. Currently held.    Diet: Carb-Modified IVF: None,None VTE: Heparin Prior to Admission Living Arrangement: Anticipated Discharge Location: TBD Barriers to Discharge: Continued medical care Dispo: Anticipated discharge in approximately 3-4 day(s).   Idamae Schuller, MD Tillie Rung. [facility] Internal Medicine Residency, PGY-1  Pager: [phone] After 5 pm and on weekends: Please call the on-call pager

## 2021-05-22 NOTE — Consult Note (Signed)
NAME:  [name], MRN:  [id number], DOB:  24-Dec-1970, LOS: 1 ADMISSION DATE:  05/21/2021, CONSULTATION DATE: 05/21/2021 REFERRING MD: Emergency department physician, CHIEF COMPLAINT: PE with right heart strain  History of Present Illness:  50 year old female who has a plethora of health issues including autoimmune disease with lupus is steroid-dependent, insulin-dependent diabetic, gastroesophageal reflux disease, obstructive sleep apnea, congestive heart failure, history of renal failure, stroke, obesity eczema, Bell's palsy, diastolic congestive heart failure who was in her usual state of poor health until 05/20/2021 at which time she developed acute shortness of breath.  She presented to the emergency room CT showed pulmonary embolism with right heart strain pulmonary critical care asked to evaluate  Pertinent  Medical History   Past Medical History:  Diagnosis Date   Acute renal failure (HCC)     Intractable nausea vomiting secondary to diabetic gastroparesis causing dehydration and acute renal failure /notes 04/01/2013   Anginal pain ([place])    Anxiety    Bell's palsy 08/09/2010   Bicornuate uterus    CAP (community acquired pneumonia) 10/2011   [facility] 11/08/2011   Chest pain 01/13/2014   CHF (congestive heart failure) ([place])    Diabetic gastroparesis associated with type 2 diabetes mellitus ([place])    this is presumed diagnoses, not confirmed by any studies.    Eczema    Family history of malignant neoplasm of breast    Family history of malignant neoplasm of ovary    GERD (gastroesophageal reflux disease)    High cholesterol    Hypertension    Liver lesion 08/13/2019   Liver mass 2014   biopsied 03/2013 at [facility], not malignant.  is to undergo radiologic ablation of the mass in sept/October 2014.    Lupus ([place])    Migraines    "maybe a couple times/yr" (04/01/2013)   Obesity    Obstructive sleep apnea on CPAP 2011   Oxygen at nights   SJOGREN'S SYNDROME 08/09/2010   SLE  (systemic lupus erythematosus) ([place])    Ar[facility] Endo 12/01/2010   Stroke ([place]) 2010; 10/2012   "left side is still weak from it, never fully regained full strength; no additions from stroke 10/2012"     Significant Hospital Events: Including procedures, antibiotic start and stop dates in addition to other pertinent events   Heparin drip 05/21/2021  Interim History / Subjective:  Improved tachycardia. On supplemental O2. Comfortable. Some shortness of breath but improved  Objective   Blood pressure 134/83, pulse 100, temperature 98.1 F (36.7 C), temperature source Oral, resp. rate 20, height 5\' 8"  (1.727 m), weight (!) 159.6 kg, last menstrual period 03/24/2017, SpO2 100 %.        Intake/Output Summary (Last 24 hours) at 05/22/2021 0921 Last data filed at 05/21/2021 1645 Gross per 24 hour  Intake 218.4 ml  Output --  Net 218.4 ml   Filed Weights   05/21/21 0723  Weight: (!) 159.6 kg   Body mass index is 53.5 kg/m.  Physical Exam: General: Well-appearing, no acute distress HENT: [place], AT, OP clear, MMM Eyes: EOMI, no scleral icterus Respiratory: Clear to auscultation bilaterally.  No crackles, wheezing or rales Cardiovascular: RRR, -M/R/G, no JVD Extremities:-Edema,-tenderness Neuro: AAO x4, CNII-XII grossly intact  Resolved Hospital Problem list     Assessment & Plan:   Acute hypoxemic respiratory failure secondary to PE --Wean supplemental O2 for goal SpO2 >90% --Will need ambulatory O2 to determine home O2 needs --Will arrange follow-up with Dr. Shearon Stalls  Acute submassive pulmonary embolism  Intermediate PESI score (100 pts). Discussed risk of thrombolytics and determined risk outweighed benefits given her chronic co-morbidities. Tachycardia improved --Continue heparin gtt --No indication for lytics --Defer to primary to transition to PO anticoagulation --Obtain lower extremity dopplers. ADDENDUM: Neg.  --Suspect will need lifelong anticoagulation for unprovoked PE. Will  need amb referral to Hematology for hypercoag work-up --Will need repeat echocardiogram in 3 months. Recommend follow-up with PCP and cardiology with her prior hx of HF after discharge.  OSA Continue CPAP nightly  Hypertension,, Reflux Disease, Congestive Heart Failure, Obesity, insulin-dependent diabetic, lupus steroid-dependent all per primary  Pulmonary team will sign off.  Best Practice (right click and "Reselect all SmartList Selections" daily)   Diet/type: Regular consistency (see orders) DVT prophylaxis: systemic heparin GI prophylaxis: PPI Lines: N/A Foley:  N/A Code Status:  full code Last date of multidisciplinary goals of care discussion [tbd]  Labs   CBC: Recent Labs  Lab 05/21/21 0208 05/22/21 0059  WBC 13.0* 10.8*  NEUTROABS 8.9*  --   HGB 12.9 12.2  HCT 41.8 37.7  MCV 90.3 87.5  PLT 218 423    Basic Metabolic Panel: Recent Labs  Lab 05/21/21 0208 05/22/21 0059  NA 138 139  K 4.0 3.6  CL 107 108  CO2 23 22  GLUCOSE 293* 250*  BUN 8 11  CREATININE 1.12* 0.95  CALCIUM 9.5 8.7*   GFR: Estimated Creatinine Clearance: 114.3 mL/min (by C-G formula based on SCr of 0.95 mg/dL). Recent Labs  Lab 05/21/21 0208 05/22/21 0059  WBC 13.0* 10.8*    Liver Function Tests: Recent Labs  Lab 05/21/21 0208 05/22/21 0059  AST 75* 17  ALT 24 14  ALKPHOS 138* 119  BILITOT 0.6 0.4  PROT 6.4* 5.9*  ALBUMIN 3.3* 2.8*   No results for input(s): LIPASE, AMYLASE in the last 168 hours. No results for input(s): AMMONIA in the last 168 hours.  ABG    Component Value Date/Time   PHART 7.285 (L) 08/14/2019 0517   PCO2ART 38.9 08/14/2019 0517   PO2ART 88.3 08/14/2019 0517   HCO3 18.0 (L) 08/14/2019 0517   TCO2 19 (L) 02/18/2021 1851   ACIDBASEDEF 7.5 (H) 08/14/2019 0517   O2SAT 96.0 08/14/2019 0517     Coagulation Profile: Recent Labs  Lab 05/21/21 0810  INR 1.0   Critical care time:  N/A    Rodman Pickle, M.D. [facility] Pulmonary/Critical Care  Medicine 05/22/2021 9:21 AM   See Amion for personal pager For hours between 7 PM to 7 AM, please call Elink for urgent questions

## 2021-05-22 NOTE — Progress Notes (Signed)
Lower extremity venous has been completed.   Preliminary results in CV Proc.   [name] 05/22/2021 11:47 AM

## 2021-05-22 NOTE — Plan of Care (Signed)

## 2021-05-22 NOTE — Progress Notes (Signed)
SATURATION QUALIFICATIONS: (This note is used to comply with regulatory documentation for home oxygen)  Patient Saturations on Room Air at Rest = 98%  Patient Saturations on Room Air while Ambulating = 92%  Patient Saturations on 2 Liters of oxygen while Ambulating = 100%  Please briefly explain why patient needs home oxygen: patient complained of SOB while ambulating on RA

## 2021-05-22 NOTE — Progress Notes (Signed)
[place] for heparin Indication: pulmonary embolus  Allergies  Allergen Reactions   Metoclopramide Other (See Comments)    Developed restless leg, akathisia type limb movements.    Codeine Itching, Rash and Other (See Comments)   Hydrocodone Rash   Penicillins Itching    Tolerated Unasyn 02/01/2020   Percocet [Oxycodone-Acetaminophen] Hives and Rash    Tolerates dilaudid Tolerates acetaminophen     Patient Measurements: Height: 5\' 8"  (172.7 cm) Weight: (!) 159.6 kg (351 lb 13.7 oz) IBW/kg (Calculated) : 63.9 Heparin Dosing Weight: 103.8 kg  Vital Signs: Temp: 98 F (36.7 C) (10/21 1900) Temp Source: Oral (10/21 1900) BP: 126/82 (10/21 2330) Pulse Rate: 106 (10/22 0014)  Labs: Recent Labs    05/21/21 0208 05/21/21 0408 05/21/21 0810 05/21/21 1625 05/22/21 0059  HGB 12.9  --   --   --  12.2  HCT 41.8  --   --   --  37.7  PLT 218  --   --   --  211  APTT  --   --  24  --   --   LABPROT  --   --  13.5  --   --   INR  --   --  1.0  --   --   HEPARINUNFRC  --   --   --  0.64 0.57  CREATININE 1.12*  --   --   --  0.95  TROPONINIHS 128* 175*  --   --   --      Estimated Creatinine Clearance: 114.3 mL/min (by C-G formula based on SCr of 0.95 mg/dL).   Assessment:  50 y.o. female with PE for heparin  Goal of Therapy:  Heparin level 0.3-0.7 units/ml Monitor platelets by anticoagulation protocol: Yes   Plan:  Continue Heparin at current rate   Phillis Knack, PharmD, BCPS

## 2021-05-23 LAB — GLUCOSE, CAPILLARY
Glucose-Capillary: 204 mg/dL — ABNORMAL HIGH (ref 70–99)
Glucose-Capillary: 176 mg/dL — ABNORMAL HIGH (ref 70–99)
Glucose-Capillary: 286 mg/dL — ABNORMAL HIGH (ref 70–99)
Glucose-Capillary: 331 mg/dL — ABNORMAL HIGH (ref 70–99)
Glucose-Capillary: 316 mg/dL — ABNORMAL HIGH (ref 70–99)
Glucose-Capillary: 229 mg/dL — ABNORMAL HIGH (ref 70–99)

## 2021-05-23 LAB — BASIC METABOLIC PANEL
Anion gap: 8 (ref 5–15)
BUN: 13 mg/dL (ref 6–20)
CO2: 24 mmol/L (ref 22–32)
Calcium: 8.6 mg/dL — ABNORMAL LOW (ref 8.9–10.3)
Chloride: 104 mmol/L (ref 98–111)
Creatinine, Ser: 0.9 mg/dL (ref 0.44–1.00)
GFR, Estimated: >60 mL/min (ref >60)
Glucose, Bld: 235 mg/dL — ABNORMAL HIGH (ref 70–99)
Potassium: 3.6 mmol/L (ref 3.5–5.1)
Sodium: 136 mmol/L (ref 135–145)

## 2021-05-23 LAB — CBC
HCT: 36.3 % (ref 36.0–46.0)
Hemoglobin: 11.5 g/dL — ABNORMAL LOW (ref 12.0–15.0)
MCH: 28.3 pg (ref 26.0–34.0)
MCHC: 31.7 g/dL (ref 30.0–36.0)
MCV: 89.2 fL (ref 80.0–100.0)
Platelets: 193 10*3/uL (ref 150–400)
RBC: 4.07 MIL/uL (ref 3.87–5.11)
RDW: 14.3 % (ref 11.5–15.5)
WBC: 11.4 10*3/uL — ABNORMAL HIGH (ref 4.0–10.5)
nRBC: 0 % (ref 0.0–0.2)

## 2021-05-23 LAB — HEPARIN LEVEL (UNFRACTIONATED): Heparin Unfractionated: 0.52 IU/mL (ref 0.30–0.70)

## 2021-05-23 MED ORDER — ROSUVASTATIN CALCIUM 20 MG PO TABS
20.0000 mg | ORAL_TABLET | Freq: Every day | ORAL | Status: DC
  Administered 2021-05-23 – 2021-05-27 (×5): 20 mg via ORAL
  Filled 2021-05-23 (×5): qty 1

## 2021-05-23 MED ORDER — INSULIN GLARGINE-YFGN 100 UNIT/ML ~~LOC~~ SOLN
10.0000 [IU] | Freq: Every day | SUBCUTANEOUS | Status: DC
  Administered 2021-05-23: 10 [IU] via SUBCUTANEOUS
  Filled 2021-05-23 (×2): qty 0.1

## 2021-05-23 MED ORDER — APIXABAN 5 MG PO TABS: 5.0000 mg | ORAL_TABLET | Freq: Two times a day (BID) | ORAL | Status: DC

## 2021-05-23 MED ORDER — APIXABAN 5 MG PO TABS
10.0000 mg | ORAL_TABLET | Freq: Two times a day (BID) | ORAL | Status: DC
  Administered 2021-05-23 – 2021-05-27 (×9): 10 mg via ORAL
  Filled 2021-05-23 (×9): qty 2

## 2021-05-23 NOTE — Plan of Care (Signed)

## 2021-05-23 NOTE — Evaluation (Signed)
Physical Therapy Evaluation Patient Details Name: [name] MRN: [id number] DOB: 06-04-1971 Today's Date: 05/23/2021  History of Present Illness  Pt is a 50 y/o female admitted secondary to worsening SOB. Found to have  Clinical Impression  Pt admitted secondary to problem above with deficits below. Pt requiring min A to stand and take steps to chair. Pt with increased lightheadedness during transfer, so further mobility deferred. Oxygen sats at 92 % on RA and BP at 171/90 after sitting. Pt currently lives alone and has had a decline in function. Recommending SNF level therapies at d/c to address current deficits. However, if pt progresses well, may be able to go home with HHPT. Will continue to follow acutely.        Recommendations for follow up therapy are one component of a multi-disciplinary discharge planning process, led by the attending physician.  Recommendations may be updated based on patient status, additional functional criteria and insurance authorization.  Follow Up Recommendations SNF;Supervision for mobility/OOB    Equipment Recommendations  Wheelchair (measurements PT);Wheelchair cushion (measurements PT) (bariatric WC)    Recommendations for Other Services       Precautions / Restrictions Precautions Precautions: Fall Restrictions Weight Bearing Restrictions: No      Mobility  Bed Mobility Overal bed mobility: Needs Assistance Bed Mobility: Supine to Sit     Supine to sit: Supervision;HOB elevated     General bed mobility comments: Supervision for safety. Increased time required.    Transfers Overall transfer level: Needs assistance Equipment used: Rolling walker (2 wheeled) Transfers: Sit to/from [facility] Sit to Stand: Min assist Stand pivot transfers: Min assist       General transfer comment: Pt requiring min A for steadying. After standing and taking a few steps to chair, pt reporting increased lightheadedness and  requested to sit down. BP checked when sitting and was at 171/90. Oxygen sats at 92% on RA. Notified MD  Ambulation/Gait                Stairs            Wheelchair Mobility    Modified Rankin (Stroke Patients Only)       Balance Overall balance assessment: Needs assistance Sitting-balance support: No upper extremity supported;Feet supported Sitting balance-Leahy Scale: Fair     Standing balance support: Bilateral upper extremity supported;During functional activity Standing balance-Leahy Scale: Poor Standing balance comment: Reliant on UE and external support                             Pertinent Vitals/Pain Pain Assessment: Faces Faces Pain Scale: Hurts little more Pain Location: generalized Pain Descriptors / Indicators: Guarding;Grimacing Pain Intervention(s): Limited activity within patient's tolerance;Monitored during session;Repositioned    Home Living Family/patient expects to be discharged to:: Private residence Living Arrangements: Alone Available Help at Discharge: Family;Available PRN/intermittently;Personal care attendant Type of Home: House Home Access: Stairs to enter Entrance Stairs-Rails: Right;Left;Can reach both Entrance Stairs-Number of Steps: 6 Home Layout: One level Home Equipment: Walker - 2 wheels;Walker - 4 wheels;Cane - single point;Bedside commode;Shower seat      Prior Function Level of Independence: Needs assistance   Gait / Transfers Assistance Needed: uses cane vs walker, household ambulator  ADL's / Homemaking Assistance Needed: aide assists 7 days/wk, ~3 hours/day. sister brings meals        Hand Dominance   Dominant Hand: Right    Extremity/Trunk Assessment   Upper Extremity Assessment  Upper Extremity Assessment: Defer to OT evaluation    Lower Extremity Assessment Lower Extremity Assessment: Generalized weakness    Cervical / Trunk Assessment Cervical / Trunk Assessment: Other  exceptions Cervical / Trunk Exceptions: body habitus  Communication   Communication: No difficulties  Cognition Arousal/Alertness: Awake/alert Behavior During Therapy: Flat affect Overall Cognitive Status: Within Functional Limits for tasks assessed                                        General Comments      Exercises     Assessment/Plan    PT Assessment Patient needs continued PT services  PT Problem List Decreased strength;Decreased balance;Decreased activity tolerance;Decreased mobility;Decreased knowledge of use of DME;Decreased knowledge of precautions;Cardiopulmonary status limiting activity       PT Treatment Interventions DME instruction;Gait training;Functional mobility training;Stair training;Therapeutic activities;Therapeutic exercise;Balance training;Patient/family education    PT Goals (Current goals can be found in the Care Plan section)  Acute Rehab PT Goals Patient Stated Goal: to breathe better PT Goal Formulation: With patient Time For Goal Achievement: 06/06/21 Potential to Achieve Goals: Fair    Frequency Min 3X/week   Barriers to discharge        Co-evaluation               AM-PAC PT "6 Clicks" Mobility  Outcome Measure Help needed turning from your back to your side while in a flat bed without using bedrails?: A Little Help needed moving from lying on your back to sitting on the side of a flat bed without using bedrails?: A Little Help needed moving to and from a bed to a chair (including a wheelchair)?: A Little Help needed standing up from a chair using your arms (e.g., wheelchair or bedside chair)?: A Little Help needed to walk in hospital room?: A Lot Help needed climbing 3-5 steps with a railing? : Total 6 Click Score: 15    End of Session Equipment Utilized During Treatment: Gait belt Activity Tolerance: Treatment limited secondary to medical complications (Comment) (lightheadedness) Patient left: in chair;with  call bell/phone within reach Nurse Communication: Mobility status PT Visit Diagnosis: Unsteadiness on feet (R26.81);Muscle weakness (generalized) (M62.81);Difficulty in walking, not elsewhere classified (R26.2)    Time: 4970-2637 PT Time Calculation (min) (ACUTE ONLY): 16 min   Charges:   PT Evaluation $PT Eval Moderate Complexity: 1 Mod          Reuel Derby, PT, DPT  Acute Rehabilitation Services  Pager: [phone] Office: [phone]   Rudean Hitt 05/23/2021, 11:19 AM

## 2021-05-23 NOTE — TOC Initial Note (Signed)
Transition of Care [facility]) - Initial/Assessment Note    Patient Details  Name: [name] MRN: [id number] Date of Birth: [birth year]  [facility] of [facility]) CM/SW Contact:    Alfredia Ferguson, LCSW Phone Number: 05/23/2021, 12:27 PM  Clinical Narrative:                 TOC received PT recommendation for SNF. CSW notes barriers due to patient's insurance being Medicaid. CSW met with patient and introduced self and role. CSW discussed PT recommendation and provided education on SNF level of care. CSW noted patient expressed she was worried as she cannot be aware from her home that long. Patient asked about doing it at home and CSW discussed HHPT and affirmed PT recommendation for SNF. CSW noted patient expressed concerns with the stairs at her home being a barrier but noted her sister lives down the block and can help her out. CSW encouraged her to call with her sister for her feedback and will continue to follow at this time.   Expected Discharge Plan: [place] Barriers to Discharge: Financial Resources, Unsafe home situation   Patient Goals and CMS Choice Patient states their goals for this hospitalization and ongoing recovery are:: "I really need to go home, I can't be away for that long."      Expected Discharge Plan and Services Expected Discharge Plan: [place] Choice: [place] arrangements for the past 2 months: Single Family Home                                      Prior Living Arrangements/Services Living arrangements for the past 2 months: Single Family Home Lives with:: Self Patient language and need for interpreter reviewed:: Yes Do you feel safe going back to the place where you live?: Yes      Need for Family Participation in Patient Care: No (Comment) Care giver support system in place?: No (comment) Current home services: DME Criminal Activity/Legal Involvement Pertinent to Current  Situation/Hospitalization: No - Comment as needed  Activities of Daily Living      Permission Sought/Granted Permission sought to share information with : Facility Art therapist granted to share information with : Yes, Verbal Permission Granted (Pending)              Emotional Assessment Appearance:: Appears older than stated age Attitude/Demeanor/Rapport: Guarded Affect (typically observed): Calm Orientation: : Oriented to Self, Oriented to Place, Oriented to  Time, Oriented to Situation Alcohol / Substance Use: Not Applicable Psych Involvement: No (comment)  Admission diagnosis:  Pulmonary embolism (HCC) [I26.99] Acute respiratory failure with hypoxia ([place]) [J96.01] Acute pulmonary embolism, unspecified pulmonary embolism type, unspecified whether acute cor pulmonale present ([place]) [I26.99] Patient Active Problem List   Diagnosis Date Noted   Pulmonary embolism ([place]) 05/21/2021   Physical debility 08/29/2019   Obesity hypoventilation syndrome ([place]) 08/23/2019   Skin breakdown 08/23/2019   Essential hypertension 08/23/2019   Liver hemorrhage 08/23/2019   Hepatic adenoma    Type 2 diabetes mellitus with hyperlipidemia ([place]) 09/19/2015   Radiculopathy of cervical spine    Family history of malignant neoplasm of breast    Family history of malignant neoplasm of ovary    Intractable nausea and vomiting 11/11/2013   Septate uterus 09/08/2013   Diabetic gastroparesis ([place]) 04/28/2013   Constipation due to pain  medication 04/21/2013   Odynophagia 04/04/2013   Liver masses 02/28/2013   Reflux esophagitis 02/26/2013   Morbid obesity ([place]) 10/10/2012   Chronic diastolic CHF (congestive heart failure) ([place]) 10/09/2012   Clinical polymyositis syndrome ([place]) 09/06/2012   Mononeuritis of lower limb 01/12/2012   Hypertension complicating diabetes ([place]) 95/39/6728   Chronic systolic CHF (congestive heart failure) ([place]) 11/08/2011   HLD (hyperlipidemia)  07/19/2011   Bell's palsy 08/09/2010   SLE 08/09/2010   SJOGREN'S SYNDROME 08/09/2010   PCP:  Bartholome Bill, MD Pharmacy:   [id number] [address] [address]Phone: [phone]: [phone] [facility] [address] [place] [place]Phone: [phone]: [phone] [facility] [address]Phone: [phone]: [phone] [place] [address]Phone: [phone]: [phone]    Social Determinants of Health (SDOH) Interventions    Readmission Risk Interventions Readmission Risk Prevention Plan 02/05/2020  Transportation Screening Complete  Medication Review (Press photographer Referral to Pharmacy  [place] CityComplete  SW Recovery Care/Counseling Consult Complete  Palliative Care Screening Not ATiawahNot Applicable  Some recent data might be hidden

## 2021-05-23 NOTE — Progress Notes (Signed)
[name] is a 50 y.o. female with a pertinent PMH of lupus, Sjogren's syndrome, congestive heart failure, type 2 diabetes, HLD, HTN, obesity, OSA, and stroke, who presents to [facility] with shortness of breath found to have a pulmonary embolism.   Subjective:  Overnight:NAEON  Patient endorsed feeling of shortness of breath especially with ambulation with her oxygen saturation being greater than 90%.    Objective:  Vital signs in last 24 hours: Vitals:   05/22/21 1536 05/22/21 1949 05/22/21 2224 05/23/21 0002  BP: 132/76 136/79  138/81  Pulse: 100 (!) 106 (!) 103   Resp: (!) 23 20 16    Temp: 98.2 F (36.8 C) 98.1 F (36.7 C)  98.8 F (37.1 C)  TempSrc: Oral Oral  Oral  SpO2: 100% 100% 99% 100%  Weight:      Height:       Supplemental O2: [place] SpO2: 100 % O2 Flow Rate (L/min): 2 L/min  Physical Exam General: NAD Head: Normocephalic without scalp lesions.  Neck: Neck supple with full range of motion (ROM).  Lungs: CTAB, no wheeze, rhonchi or rales.  Cardiovascular: Normal heart sounds, no r/m/g Abdomen: No TTP, normal bowel sounds MSK: No asymmetry or muscle atrophy. Full range of motion (ROM) of all joints. No LE edema  Skin: warm, dry, good skin turgor, no lesions Neuro: Alert and oriented. CN grossly intact Psych: Normal mood and normal affect   Filed Weights   05/21/21 0723  Weight: (!) 159.6 kg    No intake or output data in the 24 hours ending 05/23/21 0439  Net IO Since Admission: 218.4 mL [05/23/21 0439]  Assessment/Plan:  [name] is a 50 y.o. female with a pertinent PMH of lupus, Sjogren's syndrome, congestive heart failure, type 2 diabetes, HLD, HTN, obesity, OSA, and stroke, who presents to [facility] with shortness of breath found to have a pulmonary embolism.   Plan: Submassive PE Lupus: Patient presented with shortness of breath and found to have a submassive PE with Pesi score of 102 that she has had intermittent risk of 30-day mortality.  This is  considered as an unprovoked PE. Does have a history of lupus, and is currently taking hydroxychloroquine, weekly Benlysta (on Thursdays), and prednisone. She was previously on Plavix, but this was discontinued in the work-up of the liver mass and was never restarted per patient although her last note states she is on Plavix.  Her tachycardia has improved and she is satting >90 on RA but continues to endorse feeling of shortness of breath. Doppler negative for DVT in bilateral LE.  - Appreciate PCCM's recommendations and assessment for lytics. - On heparin ggt, will transition to Eliquis today.  - Continue hydroxychloroquine 200 mg BID, prednisone 2 mg qd, and azathioprine 150 mg BID   HFmrEF HTN Last echo 6//2022 showing ejection fraction of 45 to 50%, with mild ventricular agitation with moderate left ventricular hypertrophy.  Echo 10/22 showed worsening of EF to 30 to 35% most likely in setting of PE. Medications include Entresto, Coreg, spironolactone, BiDil, and Norvasc.  We will currently hold these medications in the setting of submassive PE. -Follow with cardiology outpatient for repeat Echo after resolution of PE.    Hypothyroidism -Levothyroxine 50 MCG   Type 2 DM Her home regimen metformin is 1000 mg twice daily and Victoza 1.8 mg daily. A1c 12.9 05/22/21. Glucose running greater than 200.  -Semglee 10 units nightly. Increase as needed based on CBGs.  --Start SSI   Hepatic  Adenoma Patient has hx of hepatic adenoma complicated by hemoperitoneum in January 2021 and had emobolization at [facility]. She follows with Dr. Ned Card for her hepatic masses at [facility]. Last imaging showed it to be stable. He recommended follow up in 1 year for repeat imaging.  -Continue to monitor   HLD - Due to her age and previous hx of stroke, will switch her pravastatin to Crestor 20 mg.    GERD -Pepcid 20 mg   OSA Sleep study at [facility] years ago, she is unsure as to when this was done. Wears  her CPAP at least 4 hours every night.  -CPAP at night  Hx of Stroke Patient has hx of stroke in 2011, 2014, 2015. -Home med aspirin 81 mg. Currently held.    Diet: Carb-Modified IVF: None,None VTE: Heparin Prior to Admission Living Arrangement: Anticipated Discharge Location: TBD Barriers to Discharge: Continued medical care Dispo: Anticipated discharge in approximately 1-2 day(s).   Idamae Schuller, MD Tillie Rung. [facility] Internal Medicine Residency, PGY-1  Pager: [phone] After 5 pm and on weekends: Please call the on-call pager

## 2021-05-23 NOTE — Progress Notes (Addendum)
ANTICOAGULATION CONSULT NOTE Pharmacy Consult for heparin > apixaban Indication: pulmonary embolus  Allergies  Allergen Reactions   Metoclopramide Other (See Comments)    Developed restless leg, akathisia type limb movements.    Codeine Itching, Rash and Other (See Comments)   Hydrocodone Rash   Penicillins Itching    Tolerated Unasyn 02/01/2020   Percocet [Oxycodone-Acetaminophen] Hives and Rash    Tolerates dilaudid Tolerates acetaminophen     Patient Measurements: Height: 5\' 8"  (172.7 cm) Weight: (!) 159.6 kg (351 lb 13.7 oz) IBW/kg (Calculated) : 63.9 Heparin Dosing Weight: 103.8 kg  Vital Signs: Temp: 98 F (36.7 C) (10/23 0736) Temp Source: Oral (10/23 0736) BP: 156/89 (10/23 0736) Pulse Rate: 102 (10/23 0736)  Labs: Recent Labs    05/21/21 0208 05/21/21 0408 05/21/21 0810 05/21/21 1625 05/22/21 0059 05/22/21 1100 05/23/21 0224  HGB 12.9  --   --   --  12.2 12.3 11.5*  HCT 41.8  --   --   --  37.7 39.0 36.3  PLT 218  --   --   --  211  --  193  APTT  --   --  24  --   --   --   --   LABPROT  --   --  13.5  --   --   --   --   INR  --   --  1.0  --   --   --   --   HEPARINUNFRC  --   --   --  0.64 0.57  --  0.52  CREATININE 1.12*  --   --   --  0.95  --  0.90  TROPONINIHS 128* 175*  --   --   --   --   --      Estimated Creatinine Clearance: 120.7 mL/min (by C-G formula based on SCr of 0.9 mg/dL).   Assessment:  50 y.o. female with bilateral occlusive acute PE with right heart strain - RV/LV ratio = 1.15, consistent with at least submassive PE and intermediate PESI score. No AC PTA. Heparin level is therapeutic at 0.52 and has been therapeutic for the last three labs. CBC at baseline and within normal limits.   10/23 PM: providers would like to switch to apixaban. Since she was therapeutic on heparin for 2 days, finishing out the higher dose anticoagulation for apixaban for the remaining 5 days for a total of 7 days.   Goal of Therapy:  Monitor  platelets by anticoagulation protocol: Yes   Plan:  Stop heparin 1750 units/hr Start apixaban 10 mg BID for 5 days, then 5 mg BID thereafter Continue to monitor CBC and s/sx of bleeding.   Varney Daily, PharmD PGY1 Pharmacy Resident  Please check AMION for all [facility] pharmacy phone numbers After 10:00 PM call main pharmacy [phone]

## 2021-05-24 ENCOUNTER — Other Ambulatory Visit (HOSPITAL_COMMUNITY): Payer: Self-pay

## 2021-05-24 DIAGNOSIS — I2609 Other pulmonary embolism with acute cor pulmonale: Secondary | ICD-10-CM | POA: Diagnosis not present

## 2021-05-24 DIAGNOSIS — I2699 Other pulmonary embolism without acute cor pulmonale: Secondary | ICD-10-CM | POA: Diagnosis not present

## 2021-05-24 LAB — CBC
HCT: 34.8 % — ABNORMAL LOW (ref 36.0–46.0)
Hemoglobin: 11.2 g/dL — ABNORMAL LOW (ref 12.0–15.0)
MCH: 28.8 pg (ref 26.0–34.0)
MCHC: 32.2 g/dL (ref 30.0–36.0)
MCV: 89.5 fL (ref 80.0–100.0)
Platelets: 201 10*3/uL (ref 150–400)
RBC: 3.89 MIL/uL (ref 3.87–5.11)
RDW: 14.1 % (ref 11.5–15.5)
WBC: 10.9 10*3/uL — ABNORMAL HIGH (ref 4.0–10.5)
nRBC: 0 % (ref 0.0–0.2)

## 2021-05-24 LAB — GLUCOSE, CAPILLARY
Glucose-Capillary: 230 mg/dL — ABNORMAL HIGH (ref 70–99)
Glucose-Capillary: 202 mg/dL — ABNORMAL HIGH (ref 70–99)
Glucose-Capillary: 226 mg/dL — ABNORMAL HIGH (ref 70–99)
Glucose-Capillary: 225 mg/dL — ABNORMAL HIGH (ref 70–99)
Glucose-Capillary: 413 mg/dL — ABNORMAL HIGH (ref 70–99)

## 2021-05-24 MED ORDER — INSULIN ASPART 100 UNIT/ML IJ SOLN
0.0000 [IU] | Freq: Every day | INTRAMUSCULAR | Status: DC
  Administered 2021-05-24: 5 [IU] via SUBCUTANEOUS

## 2021-05-24 MED ORDER — INSULIN NPH (HUMAN) (ISOPHANE) 100 UNIT/ML ~~LOC~~ SUSP
25.0000 [IU] | Freq: Two times a day (BID) | SUBCUTANEOUS | Status: DC
  Administered 2021-05-24 – 2021-05-25 (×2): 25 [IU] via SUBCUTANEOUS
  Filled 2021-05-24: qty 10

## 2021-05-24 MED ORDER — SODIUM CHLORIDE 0.9 % IV SOLN
6.2500 mg | Freq: Four times a day (QID) | INTRAVENOUS | Status: DC | PRN
  Filled 2021-05-24 (×2): qty 0.25

## 2021-05-24 MED ORDER — SCOPOLAMINE 1 MG/3DAYS TD PT72
1.0000 | MEDICATED_PATCH | TRANSDERMAL | Status: DC
Start: 2021-05-24 — End: 2021-05-26
  Administered 2021-05-24: 1.5 mg via TRANSDERMAL
  Filled 2021-05-24: qty 1

## 2021-05-24 MED ORDER — TOPIRAMATE 25 MG PO TABS
75.0000 mg | ORAL_TABLET | Freq: Every day | ORAL | Status: DC
  Administered 2021-05-24 – 2021-05-26 (×3): 75 mg via ORAL
  Filled 2021-05-24 (×3): qty 3

## 2021-05-24 MED ORDER — SCOPOLAMINE 1 MG/3DAYS TD PT72
1.0000 | MEDICATED_PATCH | TRANSDERMAL | Status: DC | PRN
  Filled 2021-05-24: qty 1

## 2021-05-24 MED ORDER — SACUBITRIL-VALSARTAN 97-103 MG PO TABS
1.0000 | ORAL_TABLET | Freq: Two times a day (BID) | ORAL | Status: DC
  Administered 2021-05-24: 1 via ORAL
  Filled 2021-05-24 (×2): qty 1

## 2021-05-24 MED ORDER — SACUBITRIL-VALSARTAN 97-103 MG PO TABS
1.0000 | ORAL_TABLET | Freq: Two times a day (BID) | ORAL | Status: DC
  Administered 2021-05-25 – 2021-05-27 (×5): 1 via ORAL
  Filled 2021-05-24 (×6): qty 1

## 2021-05-24 MED ORDER — INSULIN ASPART 100 UNIT/ML IJ SOLN
0.0000 [IU] | Freq: Three times a day (TID) | INTRAMUSCULAR | Status: DC
  Administered 2021-05-24 – 2021-05-25 (×2): 5 [IU] via SUBCUTANEOUS
  Administered 2021-05-25: 8 [IU] via SUBCUTANEOUS
  Administered 2021-05-25 – 2021-05-27 (×5): 3 [IU] via SUBCUTANEOUS

## 2021-05-24 NOTE — Progress Notes (Addendum)
Inpatient Diabetes Program Recommendations  AACE/ADA: New Consensus Statement on Inpatient Glycemic Control (2015)  Target Ranges:  Prepandial:   less than 140 mg/dL      Peak postprandial:   less than 180 mg/dL (1-2 hours)      Critically ill patients:  140 - 180 mg/dL   Lab Results  Component Value Date   GLUCAP 226 (H) 05/24/2021   HGBA1C 12.9 (H) 05/22/2021    Review of Glycemic Control Results for [name], [name] (MRN [id number]) as of 05/24/2021 13:31  Ref. Range 05/23/2021 19:59 05/23/2021 23:22 05/24/2021 03:09 05/24/2021 08:06 05/24/2021 11:47  Glucose-Capillary Latest Ref Range: 70 - 99 mg/dL 316 (H) 229 (H) 230 (H) 202 (H) 226 (H)   Diabetes history: DM 2 Outpatient Diabetes medications: NPH insulin pen- 260 units q AM, Victoza 1.8 mg daily,  Current orders for Inpatient glycemic control:  Novolog resistant q 4 hours Semglee 10 units daily  Inpatient Diabetes Program Recommendations:    Spoke with patient at bedside.  She states that she is taking her NPH 260 units using an insulin pen once a day (this requires 3 injections).  May need bid dosing instead of once a day to ensure it is lasting 24 hours. Also likely needs reduction in dose based on much lower doses of insulin patient is getting here in the hospital. Discussed with patient and encouraged her to also discuss with her outpatient MD.  She states that she checks her blood sugars 4-5 times and that they range from 160-300 mg/dL.  Discussed high A1C with patient and discussed goal blood sugars. Reminded her of the importance of eliminating sugar from beverages and reducing CHO intake.    Note patient may go home soon.  Consider d/c of Semglee and start a portion of NPH back.  Consider NPH 25 units bid and Novolog moderate correction tid with meals and HS. Titrate as needed.   At discharge, consider reduction in NPH and split to bid dosing.  Patient states she is willing to take insulin bid if needed.    Thanks,    Adah Perl, RN, BC-ADM Inpatient Diabetes Coordinator Pager [phone]  (8a-5p)

## 2021-05-24 NOTE — Evaluation (Signed)
Occupational Therapy Evaluation Patient Details Name: [name] MRN: [id number] DOB: May 17, 1971 Today's Date: 05/24/2021   History of Present Illness t is a 50 y/o female admitted secondary to worsening SOB. Found to have a pulmonary embolism. PMH of lupus, Sjogren's syndrome, congestive heart failure, type 2 diabetes, HLD, HTN, obesity, OSA, and stroke.   Clinical Impression   Pt admitted with the above diagnoses and presents with below problem list. Pt will benefit from continued acute OT to address the below listed deficits and maximize independence with basic ADLs prior to d/c to venue below. At baseline, pt able to complete household mobility and functional transfers (ie walk to the bathroom) at mod I level, assist needed for bathing/dressing/meal prep. Session limited today by nausea. Pt needed up to min A to stand and complete pericare. Session details below.       Recommendations for follow up therapy are one component of a multi-disciplinary discharge planning process, led by the attending physician.  Recommendations may be updated based on patient status, additional functional criteria and insurance authorization.   Follow Up Recommendations  Skilled nursing-short term rehab (<3 hours/day)    Assistance Recommended at Discharge Frequent or constant Supervision/Assistance  Functional Status Assessment  Patient has had a recent decline in their functional status and demonstrates the ability to make significant improvements in function in a reasonable and predictable amount of time.  Equipment Recommendations  None recommended by OT    Recommendations for Other Services       Precautions / Restrictions Precautions Precautions: Fall Restrictions Weight Bearing Restrictions: No      Mobility Bed Mobility Overal bed mobility: Needs Assistance Bed Mobility: Supine to Sit;Sit to Supine     Supine to sit: Supervision;HOB elevated Sit to supine: Min guard   General bed  mobility comments: Increased time and effort. No physical assist    Transfers Overall transfer level: Needs assistance Equipment used: Rolling walker (2 wheels) Transfers: Sit to/from Stand Sit to Stand: Min assist           General transfer comment: min A to stand from EOB. Assist to steady. Utilized rw.      Balance Overall balance assessment: Needs assistance Sitting-balance support: No upper extremity supported;Feet supported Sitting balance-Leahy Scale: Fair     Standing balance support: Bilateral upper extremity supported;During functional activity Standing balance-Leahy Scale: Poor Standing balance comment: able to stand with one arm on rw for stabilizity and complete pericare in standing. Extra time and effort, some unsteadiness but no overt LOB                           ADL either performed or assessed with clinical judgement   ADL Overall ADL's : Needs assistance/impaired Eating/Feeding: Set up   Grooming: Min guard;Sitting   Upper Body Bathing: Moderate assistance;Sitting   Lower Body Bathing: Moderate assistance;Sit to/from stand   Upper Body Dressing : Moderate assistance;Sitting   Lower Body Dressing: Moderate assistance;Sit to/from stand                 General ADL Comments: Pt completed bed mobility, sat EOB a few minutes then stood 1x for pericare completion.     Vision         Perception     Praxis      Pertinent Vitals/Pain Pain Assessment: Faces Faces Pain Scale: Hurts little more Pain Location: headache Pain Descriptors / Indicators: Aching Pain Intervention(s): Limited activity within patient's  tolerance;Monitored during session     Hand Dominance Right   Extremity/Trunk Assessment Upper Extremity Assessment Upper Extremity Assessment: Generalized weakness   Lower Extremity Assessment Lower Extremity Assessment: Generalized weakness   Cervical / Trunk Assessment Cervical / Trunk Assessment: Other  exceptions Cervical / Trunk Exceptions: body habitus   Communication Communication Communication: No difficulties   Cognition Arousal/Alertness: Awake/alert Behavior During Therapy: Flat affect Overall Cognitive Status: Within Functional Limits for tasks assessed                                       General Comments  VSS on RA    Exercises     Shoulder Instructions      Home Living Family/patient expects to be discharged to:: Private residence Living Arrangements: Alone Available Help at Discharge: Family;Available PRN/intermittently;Personal care attendant Type of Home: House Home Access: Stairs to enter [employer] of Steps: 6 Entrance Stairs-Rails: Right;Left;Can reach both Home Layout: One level     Bathroom Shower/Tub: Teacher, early years/pre: Standard Bathroom Accessibility: Yes   Home Equipment: Conservation officer, nature (2 wheels);Rollator (4 wheels);Cane - single point;BSC;Shower seat          Prior Functioning/Environment Prior Level of Function : Needs assist       Physical Assist : Mobility (physical);ADLs (physical) Mobility (physical): Stairs ADLs (physical): Bathing;Dressing;IADLs            OT Problem List: Decreased strength;Impaired balance (sitting and/or standing);Decreased activity tolerance;Decreased knowledge of precautions;Decreased knowledge of use of DME or AE;Obesity;Pain      OT Treatment/Interventions: Self-care/ADL training;Therapeutic exercise;Energy conservation;DME and/or AE instruction;Therapeutic activities;Patient/family education;Balance training    OT Goals(Current goals can be found in the care plan section) Acute Rehab OT Goals Patient Stated Goal: not stated OT Goal Formulation: With patient Time For Goal Achievement: 06/07/21 Potential to Achieve Goals: Good ADL Goals Pt Will Perform Grooming: with set-up;sitting Pt Will Perform Upper Body Dressing: with min assist;sitting Pt Will  Perform Lower Body Dressing: with min assist;sit to/from stand Pt Will Transfer to Toilet: with min guard assist;ambulating Pt Will Perform Toileting - Clothing Manipulation and hygiene: with min guard assist;sitting/lateral leans;sit to/from stand  OT Frequency: Min 2X/week   Barriers to D/C:            Co-evaluation              AM-PAC OT "6 Clicks" Daily Activity     Outcome Measure Help from another person eating meals?: None Help from another person taking care of personal grooming?: A Little Help from another person toileting, which includes using toliet, bedpan, or urinal?: A Lot Help from another person bathing (including washing, rinsing, drying)?: A Lot Help from another person to put on and taking off regular upper body clothing?: A Little Help from another person to put on and taking off regular lower body clothing?: A Lot 6 Click Score: 16   End of Session Equipment Utilized During Treatment: Rolling walker (2 wheels)  Activity Tolerance: Patient limited by fatigue;Other (comment) (pt c/o some nausea at start of session, nursing aware per pt report) Patient left: in bed;with call bell/phone within reach;with bed alarm set  OT Visit Diagnosis: Unsteadiness on feet (R26.81);Other abnormalities of gait and mobility (R26.89);Muscle weakness (generalized) (M62.81);Pain                Time: 6073-7106 OT Time Calculation (min): 22 min Charges:  OT General Charges $OT Visit: 1 Visit OT Evaluation $OT Eval Moderate Complexity: [place], OT Acute Rehabilitation Services Pager: [phone] Office: [phone]   Hortencia Pilar 05/24/2021, 12:13 PM

## 2021-05-24 NOTE — Discharge Instructions (Addendum)
Insulin Information:  Start by taking your home insulin: 60 units twice a day (before breakfast and then before dinner) Check your sugar the next morning before breakfast. If it is above 180, go ahead and increase back to your home dose but continue taking it twice a day (130 units before breakfast and then 130 units before dinner - totaling 260 units per day)   Contact Dr. Loanne Drilling today for an appointment.   Information on my medicine - ELIQUIS (apixaban)  Why was Eliquis prescribed for you? Eliquis was prescribed to treat blood clots that may have been found in the veins of your legs (deep vein thrombosis) or in your lungs (pulmonary embolism) and to reduce the risk of them occurring again.  What do You need to know about Eliquis ? The starting dose is 10 mg (two 5 mg tablets) taken TWICE daily for until 05/28/2021 afternoon, then the dose is reduced to ONE 5 mg tablet taken TWICE daily.  Eliquis may be taken with or without food.   Try to take the dose about the same time in the morning and in the evening. If you have difficulty swallowing the tablet whole please discuss with your pharmacist how to take the medication safely.  Take Eliquis exactly as prescribed and DO NOT stop taking Eliquis without talking to the doctor who prescribed the medication.  Stopping may increase your risk of developing a new blood clot.  Refill your prescription before you run out.  After discharge, you should have regular check-up appointments with your healthcare provider that is prescribing your Eliquis.    What do you do if you miss a dose? If a dose of ELIQUIS is not taken at the scheduled time, take it as soon as possible on the same day and twice-daily administration should be resumed. The dose should not be doubled to make up for a missed dose.  Important Safety Information A possible side effect of Eliquis is bleeding. You should call your healthcare provider right away if you experience any  of the following: Bleeding from an injury or your nose that does not stop. Unusual colored urine (red or dark brown) or unusual colored stools (red or black). Unusual bruising for unknown reasons. A serious fall or if you hit your head (even if there is no bleeding).  Some medicines may interact with Eliquis and might increase your risk of bleeding or clotting while on Eliquis. To help avoid this, consult your healthcare provider or pharmacist prior to using any new prescription or non-prescription medications, including herbals, vitamins, non-steroidal anti-inflammatory drugs (NSAIDs) and supplements.  This website has more information on Eliquis (apixaban): http://www.eliquis.com/eliquis/home

## 2021-05-24 NOTE — TOC Progression Note (Addendum)
Transition of Care [facility]) - Progression Note    Patient Details  Name: [name] MRN: [id number] Date of Birth: February 26, 1971  [facility] CM/SW Contact  Joanne Chars, LCSW Phone Number: 05/24/2021, 1:06 PM  Clinical Narrative:   CSW spoke with pt regarding SNF recommendation vs HH.  Pt reports that she wants to go home with [facility].  Pt reports she lives alone bu has adequate support: pt has current [facility] aide daily for 4 hours per day, pt also has sister who lives next door and is working on additional family support.  Pt also reports that she almost lost her [place] aide in the past due to being out of the home too long and is worried this could happen again.  Choice document given, no agency preference indicated.    Current DME in home: walker, cane, shower chair, raised toilet seat.  PCP in place.   Tommi Rumps at [place] accepts referral.     Expected Discharge Plan: [place] Barriers to Discharge: Financial Resources, Unsafe home situation  Expected Discharge Plan and Services Expected Discharge Plan: [place] arrangements for the past 2 months: Single Family Home                           HH Arranged: PT, OT [facility] Agency: [place] Date [facility] Agency Contacted: 05/24/21 Time [place]: 1304 Representative spoke with at [place]: [place] ([place]) Interventions    Readmission Risk Interventions Readmission Risk Prevention Plan 02/05/2020  Transportation Screening Complete  Medication Review Press photographer) Referral to Pharmacy  [place] or [place] Complete  SW Recovery Care/Counseling Consult Complete  [place] Not Applicable  Some recent data might be hidden

## 2021-05-24 NOTE — Progress Notes (Signed)
PT Cancellation Note  Patient Details Name: [name] MRN: [id number] DOB: [birth year]   Cancelled Treatment:    Reason Eval/Treat Not Completed: Medical issues which prohibited therapy; reports nausea following bedside activity with OT this am.  Will attempt again later today.   Reginia Naas 05/24/2021, 10:48 AM Magda Kiel, PT Acute Rehabilitation Services HKVQQ:[phone] [phone] 05/24/2021

## 2021-05-24 NOTE — Progress Notes (Signed)
Dr, Raymondo Band paged that patient is having 10/10 headache and that patient takes Topiramate 25 mg HS for migraines and that Tylenol has been not been working

## 2021-05-24 NOTE — Progress Notes (Addendum)
[name] is a 50 y.o. female with a pertinent PMH of lupus, Sjogren's syndrome, congestive heart failure, type 2 diabetes, HLD, HTN, obesity, OSA, and stroke, who presents to [facility] with shortness of breath found to have a pulmonary embolism.   Subjective:  Overnight:NAEON  No acute concerns voiced. Did endorse a headache and neck pain. Also stated she had chest tightness that was slightly improved since yesterday.   She was on 2L but when moved to RA she remained >95% saturation.  Objective:  Vital signs in last 24 hours: Vitals:   05/23/21 2005 05/23/21 2320 05/24/21 0000 05/24/21 0400  BP: 138/76  (!) 143/87 (!) 160/78  Pulse: 100 93 96 95  Resp: 18 14 18 20   Temp: 98.8 F (37.1 C)  98.6 F (37 C) 98.4 F (36.9 C)  TempSrc: Oral  Oral Oral  SpO2: 100% 100% 100% 100%  Weight:      Height:       Supplemental O2: [place] SpO2: 100 % O2 Flow Rate (L/min): 2 L/min  Physical Exam General: NAD Head: Normocephalic without scalp lesions.  Mouth: Moist mucus membrane. Neck: Neck supple with full range of motion (ROM).  Lungs: CTAB, no wheeze, rhonchi or rales. Limited to anterior ascultation.  Cardiovascular: Normal heart sounds, no r/m/g MSK: No asymmetry or muscle atrophy. Full range of motion (ROM) of all joints. No LE edema  Skin: warm, dry, good, skin turgor, no lesions Neuro: Alert and oriented. CN grossly intact Psych: Normal mood and normal affect   Filed Weights   05/21/21 0723  Weight: (!) 159.6 kg     Intake/Output Summary (Last 24 hours) at 05/24/2021 0719 Last data filed at 05/24/2021 0500 Gross per 24 hour  Intake 380.06 ml  Output 1000 ml  Net -619.94 ml    Net IO Since Admission: -363.71 mL [05/24/21 0719]  Assessment/Plan:  [name] is a 50 y.o. female with a pertinent PMH of lupus, Sjogren's syndrome, congestive heart failure, type 2 diabetes, HLD, HTN, obesity, OSA, and stroke, who presents to [facility] with shortness of breath found to have a  pulmonary embolism.   Plan: Submassive PE Lupus Patient presented with shortness of breath and found to have a submassive PE with Pesi score of 102 that she has had intermittent risk of 30-day mortality.  This is considered as an unprovoked PE. Does have a history of lupus, and is currently taking hydroxychloroquine, weekly Benlysta (on Thursdays), and prednisone. Her tachycardia continues to improved and she is satting >95 on RA but endorsed feeling of shortness of breath which is improving. Doppler negative for DVT in bilateral LE. Repeatedly asked if her clot will be broken and appeared to be anxious regarding this.   --Continue Eliquis Day 2/5 for 10 mg BID. After that will do 5 mg BID.  - Continue hydroxychloroquine 200 mg BID, prednisone 2 mg qd, and azathioprine 150 mg BID   HFmrEF HTN Last echo 6//2022 showing ejection fraction of 45 to 50%, with mild ventricular agitation with moderate left ventricular hypertrophy.  Echo 10/22 showed worsening of EF to 30 to 35% most likely in setting of PE. Medications include Entresto, Coreg, spironolactone, BiDil, and Norvasc.  We held these medications in the setting of submassive PE. Restarted Entresto 97-103 mg BID on 05/24/21. Will start other meds as needed for hypertension.  -Follow with cardiology outpatient for repeat Echo after resolution of PE.    Hypothyroidism -Levothyroxine 50 MCG   Type 2 DM Her  home regimen metformin is 1000 mg twice daily, Victoza 1.8 mg daily and NPH. A1c 12.9 05/22/21. Glucose running greater than 200. Suspicious for non-compliance with high home regimen in setting of elevated A1c.  -Diabetic coordinator consult placed. -Recommendation of  NPH 25 units BID and SSI. -Will increase based on patient's response.      Hepatic Adenoma Patient has hx of hepatic adenoma complicated by hemoperitoneum in January 2021 and had emobolization at [facility]. She follows with Dr. Ned Card for her hepatic masses at [facility].  Last imaging showed it to be stable. He recommended follow up in 1 year for repeat imaging.  -Continue to monitor   HLD - Due to her age and previous hx of stroke, will switch her pravastatin to Crestor 20 mg.    GERD -Pepcid 20 mg   OSA Sleep study at [facility] years ago, she is unsure as to when this was done. Wears her CPAP at least 4 hours every night.  - Continue CPAP at night  Hx of Stroke Patient has hx of stroke in 2011, 2014, 2015. -Home med aspirin 81 mg. Held since admission. -Will reach out to Neurology to see if appropriate to restart with [facility] as she will be on lifelong AC.    Diet: Carb-Modified IVF: None,None VTE: Heparin Prior to Admission Living Arrangement: Anticipated Discharge Location: TBD Barriers to Discharge: Continued medical care Dispo: Anticipated discharge in approximately 1-2 day(s).   Idamae Schuller, MD Tillie Rung. [facility] Internal Medicine Residency, PGY-1  Pager: [phone] After 5 pm and on weekends: Please call the on-call pager

## 2021-05-24 NOTE — Progress Notes (Signed)
Benefit check from [place]:  Test claim submitted for Xarelto $4/co-pay and Eliquis $4/co-pay  Lurline Idol, MSW, LCSW 10/24/202212:41 PM

## 2021-05-24 NOTE — TOC Benefit Eligibility Note (Signed)
Test claim submitted for: Xarelto $4/co-pay Eliquis $4/co-pay

## 2021-05-24 NOTE — Progress Notes (Addendum)
Physical Therapy Treatment Patient Details Name: [name] MRN: [id number] DOB: 1970/08/22 Today's Date: 05/24/2021   History of Present Illness t is a 50 y/o female admitted secondary to worsening SOB. Found to have a pulmonary embolism. PMH of lupus, Sjogren's syndrome, congestive heart failure, type 2 diabetes, HLD, HTN, obesity, OSA, and stroke.    PT Comments    Patient progressing with mobility able to walk in room this session though still reports light headed with RR up to 30's and HR 114, SpO2 on RA maintained at 94% and needed seated rest break after getting to sink.  She stood to wash hands and then rested again prior to walking back around bed.  She has an aide at home 4 hours 7 d/week and her sister comes to check on her every day when she gets off work and lives just behind her.  Feel she should progress and be able to get home as she is concerned about losing services if she goes to rehab.  Pt will continue to follow.  Recommending HHPT at d/c.    Recommendations for follow up therapy are one component of a multi-disciplinary discharge planning process, led by the attending physician.  Recommendations may be updated based on patient status, additional functional criteria and insurance authorization.  Follow Up Recommendations  Home health PT     Assistance Recommended at Discharge Intermittent Supervision/Assistance  Equipment Recommendations  Rollator (4 wheels);3in1 (PT) (bariatric)    Recommendations for Other Services       Precautions / Restrictions Precautions Precautions: Fall Precaution Comments: dizzy, watch BP     Mobility  Bed Mobility Overal bed mobility: Needs Assistance       Supine to sit: Supervision;HOB elevated Sit to supine: Supervision   General bed mobility comments: no assist needed, S for lines    Transfers Overall transfer level: Needs assistance Equipment used: Rolling walker (2 wheels);None Transfers: Sit to/from [employer] Sit to Stand: Supervision Stand pivot transfers: Min guard         General transfer comment: up to stand to RW no assist needed, increased time, some minguard to go [facility] to bed, though pt performing on her own previously, assist for balance, hygiene following BM and for line management    Ambulation/Gait Ambulation/Gait assistance: Supervision Gait Distance (Feet): 15 Feet (x 2 & 3' fwd and back to sink) Assistive device: Rolling walker (2 wheels) Gait Pattern/deviations: Step-to pattern;Decreased stride length;Trunk flexed;Shuffle     General Gait Details: slow and cautious as still light headed, but HR max 114, SpO2 94% on RA throughout and BP 164/100 after ambulation (RN aware and reports no BP meds ordered)   Stairs             Wheelchair Mobility    Modified Rankin (Stroke Patients Only)       Balance Overall balance assessment: Needs assistance Sitting-balance support: No upper extremity supported Sitting balance-Leahy Scale: Good Sitting balance - Comments: on BSC twisting to reach for hygiene no LOB   Standing balance support: Bilateral upper extremity supported Standing balance-Leahy Scale: Poor Standing balance comment: UE support for balance even washing hands at sink leaning on elbows                            Cognition Arousal/Alertness: Awake/alert Behavior During Therapy: Flat affect Overall Cognitive Status: Within Functional Limits for tasks assessed  Exercises      General Comments        Pertinent Vitals/Pain Faces Pain Scale: Hurts little more Pain Location: headache Pain Descriptors / Indicators: Aching;Headache Pain Intervention(s): Monitored during session;Premedicated before session    Home Living                          Prior Function            PT Goals (current goals can now be found in the care plan section) Progress  towards PT goals: Progressing toward goals    Frequency    Min 3X/week      PT Plan Current plan remains appropriate    Co-evaluation              AM-PAC PT "6 Clicks" Mobility   Outcome Measure  Help needed turning from your back to your side while in a flat bed without using bedrails?: None Help needed moving from lying on your back to sitting on the side of a flat bed without using bedrails?: None Help needed moving to and from a bed to a chair (including a wheelchair)?: A Little Help needed standing up from a chair using your arms (e.g., wheelchair or bedside chair)?: A Little Help needed to walk in hospital room?: A Little Help needed climbing 3-5 steps with a railing? : A Lot 6 Click Score: 19    End of Session   Activity Tolerance: Patient limited by fatigue Patient left: in bed;with call bell/phone within reach   PT Visit Diagnosis: Muscle weakness (generalized) (M62.81);Other abnormalities of gait and mobility (R26.89)     Time: 7517-0017 PT Time Calculation (min) (ACUTE ONLY): 36 min  Charges:  $Gait Training: 8-22 mins $Therapeutic Activity: 8-22 mins                     Magda Kiel, PT Acute Rehabilitation Services Pager:[phone] Office:[phone] 05/24/2021    Reginia Naas 05/24/2021, 4:51 PM

## 2021-05-25 ENCOUNTER — Telehealth: Payer: Self-pay

## 2021-05-25 DIAGNOSIS — I2609 Other pulmonary embolism with acute cor pulmonale: Secondary | ICD-10-CM | POA: Diagnosis not present

## 2021-05-25 DIAGNOSIS — I2699 Other pulmonary embolism without acute cor pulmonale: Secondary | ICD-10-CM | POA: Diagnosis not present

## 2021-05-25 LAB — CBC
HCT: 36.4 % (ref 36.0–46.0)
Hemoglobin: 11.3 g/dL — ABNORMAL LOW (ref 12.0–15.0)
MCH: 28 pg (ref 26.0–34.0)
MCHC: 31 g/dL (ref 30.0–36.0)
MCV: 90.1 fL (ref 80.0–100.0)
Platelets: 188 10*3/uL (ref 150–400)
RBC: 4.04 MIL/uL (ref 3.87–5.11)
RDW: 13.9 % (ref 11.5–15.5)
WBC: 10 10*3/uL (ref 4.0–10.5)
nRBC: 0 % (ref 0.0–0.2)

## 2021-05-25 LAB — GLUCOSE, CAPILLARY
Glucose-Capillary: 249 mg/dL — ABNORMAL HIGH (ref 70–99)
Glucose-Capillary: 273 mg/dL — ABNORMAL HIGH (ref 70–99)
Glucose-Capillary: 198 mg/dL — ABNORMAL HIGH (ref 70–99)
Glucose-Capillary: 212 mg/dL — ABNORMAL HIGH (ref 70–99)
Glucose-Capillary: 172 mg/dL — ABNORMAL HIGH (ref 70–99)

## 2021-05-25 MED ORDER — INSULIN NPH (HUMAN) (ISOPHANE) 100 UNIT/ML ~~LOC~~ SUSP
50.0000 [IU] | Freq: Two times a day (BID) | SUBCUTANEOUS | Status: DC
  Filled 2021-05-25: qty 10

## 2021-05-25 MED ORDER — SPIRONOLACTONE 25 MG PO TABS
50.0000 mg | ORAL_TABLET | Freq: Every day | ORAL | Status: DC
  Administered 2021-05-25 – 2021-05-27 (×3): 50 mg via ORAL
  Filled 2021-05-25 (×4): qty 2

## 2021-05-25 MED ORDER — INSULIN NPH (HUMAN) (ISOPHANE) 100 UNIT/ML ~~LOC~~ SUSP
28.0000 [IU] | Freq: Two times a day (BID) | SUBCUTANEOUS | Status: DC
  Administered 2021-05-25 – 2021-05-26 (×2): 28 [IU] via SUBCUTANEOUS
  Filled 2021-05-25: qty 10

## 2021-05-25 MED ORDER — AMLODIPINE BESYLATE 10 MG PO TABS
10.0000 mg | ORAL_TABLET | Freq: Every day | ORAL | Status: DC
  Administered 2021-05-25 – 2021-05-27 (×3): 10 mg via ORAL
  Filled 2021-05-25 (×4): qty 1

## 2021-05-25 MED ORDER — CARVEDILOL 25 MG PO TABS
37.5000 mg | ORAL_TABLET | Freq: Two times a day (BID) | ORAL | Status: DC
  Administered 2021-05-25 – 2021-05-27 (×4): 37.5 mg via ORAL
  Filled 2021-05-25 (×4): qty 1

## 2021-05-25 MED ORDER — TRAMADOL HCL 50 MG PO TABS
50.0000 mg | ORAL_TABLET | Freq: Once | ORAL | Status: AC
  Administered 2021-05-25: 50 mg via ORAL
  Filled 2021-05-25: qty 1

## 2021-05-25 MED ORDER — TORSEMIDE 20 MG PO TABS
20.0000 mg | ORAL_TABLET | Freq: Every day | ORAL | Status: DC
  Administered 2021-05-25 – 2021-05-26 (×2): 20 mg via ORAL
  Filled 2021-05-25 (×2): qty 1

## 2021-05-25 MED ORDER — ISOSORB DINITRATE-HYDRALAZINE 20-37.5 MG PO TABS
2.0000 | ORAL_TABLET | Freq: Three times a day (TID) | ORAL | Status: DC
  Administered 2021-05-25 – 2021-05-27 (×6): 2 via ORAL
  Filled 2021-05-25 (×8): qty 2

## 2021-05-25 MED ORDER — INSULIN NPH (HUMAN) (ISOPHANE) 100 UNIT/ML ~~LOC~~ SUSP
30.0000 [IU] | Freq: Two times a day (BID) | SUBCUTANEOUS | Status: DC
  Filled 2021-05-25: qty 10

## 2021-05-25 NOTE — Progress Notes (Signed)
CPAP on hold at this time due to Pt is nauseous at this time and has been vomiting.

## 2021-05-25 NOTE — Telephone Encounter (Signed)
-----   Message from Paulina, MD sent at 05/22/2021 12:25 PM EDT ----- Regarding: Hospital follow-up Please schedule for follow-up with Dr. Shearon Stalls in one month

## 2021-05-25 NOTE — Progress Notes (Signed)
Dr, Raymondo Band chat concerning no relief for headache and patient is still hypertensive bp 179/96 and patient is anxious. Did give xanax 0.5 mg  at 0305

## 2021-05-25 NOTE — Progress Notes (Addendum)
[name] is a 50 y.o. female with a pertinent PMH of lupus, Sjogren's syndrome, congestive heart failure, type 2 diabetes, HLD, HTN, obesity, OSA, and stroke, who presents to [facility] with shortness of breath found to have a pulmonary embolism.   Subjective:  Overnight:NAEON  Denied any acute concerns. Stated her chest tightness was better. Appeared anxious about her pulmonary embolism and if it the clot is still present. Counseled patient and told her she is on the right medication for her clot.   Objective:  Vital signs in last 24 hours: Vitals:   05/24/21 1945 05/24/21 2254 05/25/21 0252 05/25/21 0606  BP: (!) 155/81  (!) 176/96 (!) 171/95  Pulse:  91    Resp:  18    Temp: 98.3 F (36.8 C)   97.8 F (36.6 C)  TempSrc: Oral   Oral  SpO2:  98%    Weight:      Height:       Supplemental O2: [place] SpO2: 98 % O2 Flow Rate (L/min): 2 L/min  Physical Exam General: NAD Head: Normocephalic without scalp lesions.  Neck: Neck supple with full range of motion (ROM). Lungs: CTAB, no wheeze, rhonchi or rales. Limited to anterior auscultation.  Cardiovascular: normal rate and rhythm Abdomen: No TTP, normal bowel sounds MSK: No asymmetry or muscle atrophy. Full range of motion (ROM) of all joints. No LE edema  Skin: warm, dry, good skin turgor, no lesions Neuro: Alert and oriented. CN grossly intact Psych: Normal mood and normal affect   Filed Weights   05/21/21 0723  Weight: (!) 159.6 kg    No intake or output data in the 24 hours ending 05/25/21 0708  Net IO Since Admission: -363.71 mL [05/25/21 0708]  Assessment/Plan:  [name] is a 50 y.o. female with a pertinent PMH of lupus, Sjogren's syndrome, congestive heart failure, type 2 diabetes, HLD, HTN, obesity, OSA, and stroke, who presents to [facility] with shortness of breath found to have a pulmonary embolism.   Plan: Submassive PE Lupus Patient presented with shortness of breath and found to have a submassive PE with Pesi  score of 102 that she has had intermittent risk of 30-day mortality.  This is considered as an unprovoked PE. Does have a history of lupus, and is currently taking hydroxychloroquine, weekly Benlysta (on Thursdays), and prednisone. Her tachycardia continues to improved and she is satting >95 on RA but endorsed feeling of shortness of breath which is improving. Doppler negative for DVT in bilateral LE. Repeatedly asked if her clot will be broken and appeared to be anxious regarding this.   --Continue Eliquis Day 3/5 for 10 mg BID. After that will do 5 mg BID.  - Continue hydroxychloroquine 200 mg BID, prednisone 2 mg qd, and azathioprine 150 mg BID -Follow up with hematology/oncology outpatient.    HFmrEF HTN Last echo 6//2022 showing ejection fraction of 45 to 50%, with mild ventricular agitation with moderate left ventricular hypertrophy.  Echo 10/22 showed worsening of EF to 30 to 35% most likely in setting of PE. Medications include Entresto, Coreg, spironolactone, BiDil, and Norvasc.  We held these medications in the setting of submassive PE. Restarted Entresto 97-103 mg BID on 05/24/21. Restarted other medications today as well including her Coreg 37.5 BID, Norvasc 10 mg, BiDil 20-37.5 mg per tablet with 2 tabs TID, Spironolactone 50 mg daily, torsemide 20 mg at bedtime.  -Follow with cardiology outpatient for repeat Echo after resolution of PE.    Hypothyroidism -Levothyroxine  50 mcg   Type 2 DM Her home regimen metformin is 1000 mg twice daily, Victoza 1.8 mg daily and NPH. A1c 12.9 05/22/21. Glucose running greater than 200. Suspicious for non-compliance with high home regimen in setting of elevated A1c.  -Diabetic coordinator consult placed.  -Increased NPH to 30 units BID and SSI. -Will increase based on patient's response.     Hepatic Adenoma Patient has hx of hepatic adenoma complicated by hemoperitoneum in January 2021 and had emobolization at [facility]. She follows with Dr. Ned Card  for her hepatic masses at [facility]. Last imaging showed it to be stable. He recommended follow up in 1 year for repeat imaging.  -Continue to monitor   HLD - Due to her age and previous hx of stroke, will switch her pravastatin to Crestor 20 mg.    GERD -Pepcid 20 mg   OSA Sleep study at [place] years ago, she is unsure as to when this was done. Wears her CPAP at least 4 hours every night.  - Continue CPAP at night  Hx of Stroke Patient has hx of stroke in 2011, 2014, 2015. -Home med aspirin 81 mg. Held since admission. Spoke with Dr. Leonie Man and he did not recommend Aspirin in setting of Eliquis.  -Will have patient follow with Neurology outpatient   Diet: Carb-Modified IVF: None,None VTE: Heparin Prior to Admission Living Arrangement:Home Anticipated Discharge Location: Home Barriers to Discharge: Continued medical care Dispo: Anticipated discharge in approximately 1 day(s).   Idamae Schuller, MD Tillie Rung. [facility] Internal Medicine Residency, PGY-1  Pager: [phone] After 5 pm and on weekends: Please call the on-call pager

## 2021-05-25 NOTE — Plan of Care (Signed)

## 2021-05-25 NOTE — Telephone Encounter (Signed)
Patient has been scheduled with Dr. Shearon Stalls for 06/21/21 at 1130am. Patient is still currently admitted. Information will be on patient's discharge summary.

## 2021-05-25 NOTE — Plan of Care (Signed)

## 2021-05-25 NOTE — Progress Notes (Signed)
Physical Therapy Treatment Patient Details Name: [name] MRN: [id number] DOB: [birth year] Today's Date: 05/25/2021   History of Present Illness 50 y/o female admitted secondary to worsening SOB. Found to have a pulmonary embolism. PMH of lupus, Sjogren's syndrome, congestive heart failure, type 2 diabetes, HLD, HTN, obesity, OSA, and stroke.    PT Comments    Pt agreeable to PT session with focus on short-distance gait and stair training. Pt mobilizes room distance well with RW and supervision level of assist, does require increased time and RR increases to upper 20s, but SpO2 maintained 91-100% on RA. Pt completed x6 steps with use of single rail and light PT assist for boost and steadying, per pt her sister will help her enter and exit home on d/c. Pt expresses concern about dyspnea once d/c, and states she is nervous her O2 will drop at home. PT encouraged pt to use pulse oximeter at home to monitor SpO2, and take frequent rest breaks as needed to recover dyspnea. Pt agreeable, PT to continue to follow.     Recommendations for follow up therapy are one component of a multi-disciplinary discharge planning process, led by the attending physician.  Recommendations may be updated based on patient status, additional functional criteria and insurance authorization.  Follow Up Recommendations  Home health PT     Assistance Recommended at Discharge Intermittent Supervision/Assistance  Equipment Recommendations  Rollator (4 wheels);3in1 (PT) (bariatric)    Recommendations for Other Services       Precautions / Restrictions Precautions Precautions: Fall Restrictions Weight Bearing Restrictions: No     Mobility  Bed Mobility Overal bed mobility: Needs Assistance Bed Mobility: Supine to Sit;Sit to Supine     Supine to sit: Supervision     General bed mobility comments: assist for lines, increased time.    Transfers Overall transfer level: Needs assistance Equipment used: 1  person hand held assist Transfers: Sit to/from [employer] Sit to Stand: Supervision Stand pivot transfers: Supervision         General transfer comment: supervision for safety, stand pivot to [facility] without AD but used single UE on bedrails to support self. STS x3, from EOB, BSC, and recliner.    Ambulation/Gait Ambulation/Gait assistance: Supervision Gait Distance (Feet): 12 Feet Assistive device: Rolling walker (2 wheels) Gait Pattern/deviations: Decreased stride length;Trunk flexed;Shuffle;Step-through pattern Gait velocity: decr   General Gait Details: supervision for safety, vss except RR up to 27 breaths/min.   Stairs Stairs: Yes Stairs assistance: Min assist Stair Management: One rail Right;Step to pattern;Sideways Number of Stairs: 6 General stair comments: min assist to steady, boost when moving from one step to the next. Cues for sequencing (up with the "good leg" leading and down with the "bad leg" leading first, RLE stronger than L), taking her time, breathing technique.   Wheelchair Mobility    Modified Rankin (Stroke Patients Only)       Balance Overall balance assessment: Needs assistance Sitting-balance support: No upper extremity supported Sitting balance-Leahy Scale: Good     Standing balance support: Bilateral upper extremity supported Standing balance-Leahy Scale: Fair Standing balance comment: able to stand statically without UE support                            Cognition Arousal/Alertness: Awake/alert Behavior During Therapy: Flat affect Overall Cognitive Status: Impaired/Different from baseline Area of Impairment: Problem solving;Safety/judgement  Safety/Judgement: Decreased awareness of safety   Problem Solving: Slow processing General Comments: Pt with slow processing at times, and slow response time        Exercises      General Comments        Pertinent  Vitals/Pain Pain Assessment: No/denies pain Pain Intervention(s): Monitored during session    Home Living                          Prior Function            PT Goals (current goals can now be found in the care plan section) Acute Rehab PT Goals Patient Stated Goal: to breathe better PT Goal Formulation: With patient Time For Goal Achievement: 06/06/21 Potential to Achieve Goals: Fair Progress towards PT goals: Progressing toward goals    Frequency    Min 3X/week      PT Plan Current plan remains appropriate    Co-evaluation              AM-PAC PT "6 Clicks" Mobility   Outcome Measure  Help needed turning from your back to your side while in a flat bed without using bedrails?: None Help needed moving from lying on your back to sitting on the side of a flat bed without using bedrails?: None Help needed moving to and from a bed to a chair (including a wheelchair)?: A Little Help needed standing up from a chair using your arms (e.g., wheelchair or bedside chair)?: A Little Help needed to walk in hospital room?: A Little Help needed climbing 3-5 steps with a railing? : A Little 6 Click Score: 20    End of Session   Activity Tolerance: Patient limited by fatigue Patient left: with call bell/phone within reach;in chair;Other (comment) (pt verbalizes she will not get up by herself, has call bell in lap and will call RN when ready to mobilize.) Nurse Communication: Mobility status PT Visit Diagnosis: Muscle weakness (generalized) (M62.81);Other abnormalities of gait and mobility (R26.89)     Time: 5993-5701 PT Time Calculation (min) (ACUTE ONLY): 25 min  Charges:  $Gait Training: 23-37 mins                     Stacie Glaze, PT DPT Acute Rehabilitation Services Pager [phone]  Office [phone]    Louis Matte 05/25/2021, 3:48 PM

## 2021-05-25 NOTE — Progress Notes (Signed)
Dr. Raymondo Band informed that 6 beat V Tach patient 10/10 headache.

## 2021-05-26 DIAGNOSIS — I2609 Other pulmonary embolism with acute cor pulmonale: Secondary | ICD-10-CM | POA: Diagnosis not present

## 2021-05-26 DIAGNOSIS — I2699 Other pulmonary embolism without acute cor pulmonale: Secondary | ICD-10-CM | POA: Diagnosis not present

## 2021-05-26 LAB — GLUCOSE, CAPILLARY
Glucose-Capillary: 163 mg/dL — ABNORMAL HIGH (ref 70–99)
Glucose-Capillary: 180 mg/dL — ABNORMAL HIGH (ref 70–99)
Glucose-Capillary: 171 mg/dL — ABNORMAL HIGH (ref 70–99)
Glucose-Capillary: 114 mg/dL — ABNORMAL HIGH (ref 70–99)
Glucose-Capillary: 155 mg/dL — ABNORMAL HIGH (ref 70–99)
Glucose-Capillary: 225 mg/dL — ABNORMAL HIGH (ref 70–99)

## 2021-05-26 LAB — CBC
HCT: 36.2 % (ref 36.0–46.0)
Hemoglobin: 11.5 g/dL — ABNORMAL LOW (ref 12.0–15.0)
MCH: 28.9 pg (ref 26.0–34.0)
MCHC: 31.8 g/dL (ref 30.0–36.0)
MCV: 91 fL (ref 80.0–100.0)
Platelets: 232 10*3/uL (ref 150–400)
RBC: 3.98 MIL/uL (ref 3.87–5.11)
RDW: 13.9 % (ref 11.5–15.5)
WBC: 9.9 10*3/uL (ref 4.0–10.5)
nRBC: 0 % (ref 0.0–0.2)

## 2021-05-26 MED ORDER — INSULIN NPH (HUMAN) (ISOPHANE) 100 UNIT/ML ~~LOC~~ SUSP
25.0000 [IU] | Freq: Two times a day (BID) | SUBCUTANEOUS | Status: DC
  Administered 2021-05-26 – 2021-05-27 (×2): 25 [IU] via SUBCUTANEOUS
  Filled 2021-05-26: qty 10

## 2021-05-26 MED ORDER — TRIMETHOBENZAMIDE HCL 300 MG PO CAPS: 300.0000 mg | ORAL_CAPSULE | Freq: Once | ORAL | Status: DC

## 2021-05-26 MED ORDER — TRIMETHOBENZAMIDE HCL 300 MG PO CAPS
300.0000 mg | ORAL_CAPSULE | Freq: Once | ORAL | Status: AC
  Administered 2021-05-26: 300 mg via ORAL
  Filled 2021-05-26: qty 1

## 2021-05-26 MED ORDER — SCOPOLAMINE 1 MG/3DAYS TD PT72
1.0000 | MEDICATED_PATCH | TRANSDERMAL | Status: DC | PRN
  Administered 2021-05-26: 1.5 mg via TRANSDERMAL
  Filled 2021-05-26 (×2): qty 1

## 2021-05-26 NOTE — Progress Notes (Addendum)
[name] is a 50 y.o. female with a pertinent PMH of lupus, Sjogren's syndrome, congestive heart failure, type 2 diabetes, HLD, HTN, obesity, OSA, and stroke, who presents to [facility] with shortness of breath found to have a pulmonary embolism.   Subjective:  Overnight: NAEON   Patient stated she had multiple episodes of vomiting overnight and was not able to sleep. Yesterday she stated the scopolamine patch was working and she had relief with it.   Interval: After initiation of Tigan, patient endorses relief form nausea and has states she would like to eat.   Objective:  Vital signs in last 24 hours: Vitals:   05/25/21 2000 05/26/21 0000 05/26/21 0745 05/26/21 1200  BP: (!) 131/57 (!) 171/103 (!) 143/82 (!) 147/87  Pulse: 87 86 93 94  Resp: 18 16 19  (!) 25  Temp: 98 F (36.7 C)  98 F (36.7 C) 98.6 F (37 C)  TempSrc: Oral  Oral Oral  SpO2: 96% 98% 98% 98%  Weight:      Height:       Supplemental O2: [place] SpO2: 98 % O2 Flow Rate (L/min): 2 L/min  Physical Exam General: NAD Head: Normocephalic without scalp lesions.  Neck: Neck supple with full range of motion (ROM). Lungs: CTAB, no wheeze, rhonchi or rales. Limited to anterior auscultation.  Cardiovascular: normal rate and rhythm Abdomen: No TTP, normal bowel sounds MSK: No asymmetry or muscle atrophy. No LE edema  Skin: warm, dry, good skin turgor, no lesions Neuro: Alert and oriented. CN grossly intact Psych: Normal mood and anxious affect  Filed Weights   05/21/21 0723  Weight: (!) 159.6 kg    No intake or output data in the 24 hours ending 05/26/21 1803  Net IO Since Admission: -363.71 mL [05/26/21 1803]  Assessment/Plan:  [name] is a 50 y.o. female with a pertinent PMH of lupus, Sjogren's syndrome, congestive heart failure, type 2 diabetes, HLD, HTN, obesity, OSA, and stroke, who presents to [facility] with shortness of breath found to have a pulmonary embolism.   Plan: Submassive PE Lupus  --Continue  Eliquis Day 4/5 for 10 mg BID. After that will do 5 mg BID.  - Continue hydroxychloroquine 200 mg BID, prednisone 2 mg qd, and azathioprine 150 mg BID -Follow up with hematology/oncology outpatient.  -If nausea is improved, and she is able to tolerate food and po meds will discharge tomorrow.  -PCP follow scheduled for 05/28/21   HFmrEF HTN Last echo 6//2022 showing ejection fraction of 45 to 50%, with mild ventricular agitation with moderate left ventricular hypertrophy.  Echo 10/22 showed worsening of EF to 30 to 35% most likely in setting of PE. Medications include Entresto, Coreg, spironolactone, BiDil, and Norvasc.  We held these medications in the setting of submassive PE. Restarted Entresto 97-103 mg BID on 05/24/21. Restarted other medications today as well including her Coreg 37.5 BID, Norvasc 10 mg, BiDil 20-37.5 mg per tablet with 2 tabs TID, Spironolactone 50 mg daily, torsemide 20 mg at bedtime.  -Follow with cardiology outpatient; patient scheduled for 05/31/21   Hypothyroidism -Levothyroxine 50 mcg   Type 2 DM Her home regimen metformin is 1000 mg twice daily, Victoza 1.8 mg daily and NPH. A1c 12.9 05/22/21. Glucose improved in the last 24-48H.  -Diabetic coordinator consult placed.  -Continue NPH  25 units BID and SSI. -Follow up endocrinology scheduled for  06/02/21   Hepatic Adenoma Patient has hx of hepatic adenoma complicated by hemoperitoneum in January 2021 and  had emobolization at [facility]. She follows with Dr. Ned Card for her hepatic masses at [facility]. Last imaging showed it to be stable. He recommended follow up in 1 year for repeat imaging.  -Continue to monitor   HLD - Continue Crestor 20 mg   GERD -Pepcid 20 mg   OSA - Continue CPAP at night  Hx of Stroke Patient has hx of stroke in 2011, 2014, 2015. -Home med aspirin 81 mg. Held since admission. Spoke with Dr. Leonie Man and he did not recommend Aspirin in setting of Eliquis.  -Will have patient follow  with Neurology outpatient.  -Neurology will schedule an appointment with patient.    Diet: Carb-Modified IVF: None,None VTE: Heparin Prior to Admission Living Arrangement:Home Anticipated Discharge Location: Home Barriers to Discharge: Continued medical care Dispo: Anticipated discharge in approximately 1 day(s).   Idamae Schuller, MD [name]. [facility] Internal Medicine Residency, PGY-1  Pager: [phone] After 5 pm and on weekends: Please call the on-call pager

## 2021-05-26 NOTE — Progress Notes (Signed)
Pt is nauseous, holding off CPAP for the night.

## 2021-05-26 NOTE — Plan of Care (Signed)
  Problem: Education: Goal: Knowledge of General Education information will improve Description: Including pain rating scale, medication(s)/side effects and non-pharmacologic comfort measures Outcome: Not Progressing   Problem: Health Behavior/Discharge Planning: Goal: Ability to manage health-related needs will improve Outcome: Not Progressing   Problem: Clinical Measurements: Goal: Ability to maintain clinical measurements within normal limits will improve Outcome: Not Progressing Goal: Will remain free from infection Outcome: Not Progressing Goal: Diagnostic test results will improve Outcome: Not Progressing Goal: Respiratory complications will improve Outcome: Not Progressing Goal: Cardiovascular complication will be avoided Outcome: Not Progressing   Problem: Activity: Goal: Risk for activity intolerance will decrease Outcome: Not Progressing   Problem: Nutrition: Goal: Adequate nutrition will be maintained Outcome: Not Progressing   Problem: Coping: Goal: Level of anxiety will decrease Outcome: Not Progressing   Problem: Pain Managment: Goal: General experience of comfort will improve Outcome: Not Progressing   Problem: Safety: Goal: Ability to remain free from injury will improve Outcome: Not Progressing   Problem: Skin Integrity: Goal: Risk for impaired skin integrity will decrease Outcome: Not Progressing   

## 2021-05-26 NOTE — Progress Notes (Signed)
OT Cancellation Note  Patient Details Name: [name] MRN: [id number] DOB: 1971-07-06   Cancelled Treatment:    Reason Eval/Treat Not Completed: Patient declined, no reason specified (patient attempted again following lunch and she continues to compain of nausea.  Nursing is aware and awaiting pharmacy to address.) Lodema Hong, [place]  Pager [phone] Office [phone]  Trixie Dredge 05/26/2021, 2:01 PM

## 2021-05-26 NOTE — TOC Progression Note (Signed)
Transition of Care [facility]) - Progression Note    Patient Details  Name: SONOMA FIRKUS MRN: [id number] Date of Birth: Nov 28, 1970  Transition of Care [facility]) CM/SW Contact  Joanne Chars, LCSW Phone Number: 05/26/2021, 4:03 PM  Clinical Narrative:   CSW spoke with Charlesetta Ivory at [facility] and she confirmed they can resume PCS services at discharge, will tentatively plan on tomorrow.  Cory at [place] aware and also in place for [facility].  CSW spoke with pt who reports sister is available for transport home from the hospital.      Expected Discharge Plan: [place] Barriers to Discharge: Financial Resources, Unsafe home situation  Expected Discharge Plan and Services Expected Discharge Plan: [place] arrangements for the past 2 months: Single Family Home                           HH Arranged: PT, OT [facility] Agency: [place] Date [facility] Agency Contacted: 05/24/21 Time [place]: [id number] Representative spoke with at [place]: [place] ([place]) Interventions    Readmission Risk Interventions Readmission Risk Prevention Plan 05/26/2021 02/05/2020  Transportation Screening Complete Complete  Medication Review Press photographer) - Referral to Pharmacy  PCP or Specialist appointment within 3-5 days of discharge Complete -  [place] or Home Care Consult Complete Complete  SW Recovery Care/Counseling Consult Complete Complete  [place] Not Applicable Not Applicable  Some recent data might be hidden

## 2021-05-26 NOTE — Discharge Summary (Addendum)
Name: [name] MRN: [id number] DOB: Jun 30, 1971 50 y.o. PCP: Bartholome Bill, MD  Date of Admission: 05/21/2021  1:50 AM Date of Discharge: 05/27/21 Attending Physician: Sid Falcon, MD  Discharge Diagnosis: 1. Submassive PE with right heart strain 2. Heart Failure with Reduced Ejection Fraction 3. Hypertension 4. Hx of Stroke 5. Hypothyroidism  Discharge Medications: Allergies as of 05/27/2021       Reactions   Metoclopramide Other (See Comments)   Developed restless leg, akathisia type limb movements.    Codeine Itching, Rash, Other (See Comments)   Hydrocodone Rash   Penicillins Itching   Tolerated Unasyn 02/01/2020   Percocet [oxycodone-acetaminophen] Hives, Rash   Tolerates dilaudid Tolerates acetaminophen         Medication List     STOP taking these medications    aspirin EC 81 MG tablet   benzonatate 100 MG capsule Commonly known as: TESSALON   ondansetron 4 MG disintegrating tablet Commonly known as: Zofran ODT   pravastatin 10 MG tablet Commonly known as: PRAVACHOL   traMADol 50 MG tablet Commonly known as: ULTRAM       TAKE these medications    Advair HFA 230-21 MCG/ACT inhaler Generic drug: fluticasone-salmeterol Inhale 1-2 puffs into the lungs 2 (two) times daily.   albuterol (2.5 MG/3ML) 0.083% nebulizer solution Commonly known as: PROVENTIL Take 2.5 mg by nebulization every 6 (six) hours as needed for wheezing.   ALPRAZolam 0.5 MG tablet Commonly known as: XANAX Take 0.5 mg 2 (two) times daily as needed by mouth for anxiety or sleep.   amLODipine 10 MG tablet Commonly known as: NORVASC Take 1 tablet (10 mg total) by mouth daily.   ARIPiprazole 5 MG tablet Commonly known as: ABILIFY Take 5 mg by mouth daily.   Azathioprine 75 MG Tabs Take 150 mg by mouth 2 (two) times daily.   B-D UF III MINI PEN NEEDLES 31G X 5 MM Misc Generic drug: Insulin Pen Needle USE AS DIRECTED TWICE A DAY   Easy Touch Pen Needles  32G X 4 MM Misc Generic drug: Insulin Pen Needle USE TO INJECT INSULIN TWICE DAILY   Benlysta 200 MG/ML Soaj Generic drug: Belimumab Inject 200 mg into the skin once a week. Thursdays   carvedilol 25 MG tablet Commonly known as: COREG Take 1.5 tablets (37.5 mg total) by mouth 2 (two) times daily with a meal.   cromolyn 4 % ophthalmic solution Commonly known as: OPTICROM Place 1 drop into both eyes 2 (two) times daily.   cycloSPORINE 0.05 % ophthalmic emulsion Commonly known as: RESTASIS Place 1 drop into both eyes in the morning, at noon, and at bedtime.   diclofenac Sodium 1 % Gel Commonly known as: VOLTAREN Apply 2 g topically 4 (four) times daily.   DULoxetine 60 MG capsule Commonly known as: CYMBALTA Take 120 mg by mouth at bedtime.   Eliquis 5 MG Tabs tablet Generic drug: apixaban Take 1 tablet (5 mg total) by mouth 2 (two) times daily.   Entresto 97-103 MG Generic drug: sacubitril-valsartan TAKE 1 TABLET BY MOUTH 2 (TWO) TIMES DAILY.   famotidine 20 MG tablet Commonly known as: PEPCID Take 20 mg by mouth 2 (two) times daily.   fluticasone 50 MCG/ACT nasal spray Commonly known as: FLONASE Place 2 sprays into both nostrils continuous as needed for allergies or rhinitis.   HumuLIN N KwikPen 100 UNIT/ML Kiwkpen Generic drug: Insulin NPH (Human) (Isophane) Inject 20 Units into the skin 2 (two) times daily. What  changed:  how much to take when to take this   hydroxychloroquine 200 MG tablet Commonly known as: PLAQUENIL Take 200 mg by mouth 2 (two) times daily.   Insulin Syringe-Needle U-100 31G X 5/16" 1 ML Misc Commonly known as: Easy Touch Insulin Syringe USE AS DIRECTED THREE TIMES A DAY   isosorbide-hydrALAZINE 20-37.5 MG tablet Commonly known as: BiDil TAKE TWO (2) TABLETS BY MOUTH THREE TIMES DAILY What changed:  how much to take how to take this when to take this additional instructions   levothyroxine 50 MCG tablet Commonly known as:  SYNTHROID Take 1 tablet (50 mcg total) by mouth daily at 6 (six) AM.   megestrol 40 MG tablet Commonly known as: MEGACE Take 40 mg by mouth daily.   metFORMIN 500 MG 24 hr tablet Commonly known as: GLUCOPHAGE-XR Take 2 tablets (1,000 mg total) by mouth 2 (two) times daily.   multivitamin with minerals Tabs tablet Take 1 tablet by mouth daily.   onetouch ultrasoft lancets Test 4 times daily DX E11.9   OneTouch Verio test strip Generic drug: glucose blood Test 4 times daily DX E11.9   glucose blood test strip Test 4 times daily DX E11.9   Accu-Chek Aviva Plus test strip Generic drug: glucose blood USE TO TEST BLOOD SUGAR 4 TIMES DAILY   OneTouch Verio w/Device Kit Test 4 times daily.  DX E11.9   oxybutynin 15 MG 24 hr tablet Commonly known as: DITROPAN XL Take 15 mg by mouth daily.   predniSONE 1 MG tablet Commonly known as: DELTASONE Take 2 mg by mouth daily.   pregabalin 75 MG capsule Commonly known as: LYRICA Take 75 mg 3 (three) times daily by mouth.   QUEtiapine 300 MG tablet Commonly known as: SEROQUEL Take 300 mg by mouth at bedtime.   rosuvastatin 20 MG tablet Commonly known as: CRESTOR Take 1 tablet (20 mg total) by mouth daily. Start taking on: May 28, 2021   spironolactone 50 MG tablet Commonly known as: ALDACTONE Take 1 tablet (50 mg total) by mouth daily.   sucralfate 1 GM/10ML suspension Commonly known as: CARAFATE Take 10 mLs by mouth 2 (two) times daily.   topiramate 25 MG tablet Commonly known as: TOPAMAX Take 75 mg by mouth at bedtime.   torsemide 20 MG tablet Commonly known as: DEMADEX Take 20 mg by mouth at bedtime.   Transderm-Scop 1 MG/3DAYS Generic drug: scopolamine Place 1 patch (1.5 mg total) onto the skin every 3 (three) days as needed (nausea). Do not wear for more than 72 hours.   trimethobenzamide 300 MG capsule Commonly known as: Tigan Take 1 capsule (300 mg total) by mouth 3 (three) times daily as needed for  nausea/vomiting. Do not take with the Scopolamine unless nausea is not improving   Victoza 18 MG/3ML Sopn Generic drug: liraglutide INJECT 1.8 MG UNDER THE SKIN ONCE DAILY IN THE MORNING What changed: See the new instructions.               Durable Medical Equipment  (From admission, onward)           Start     Ordered   05/27/21 1048  For home use only DME standard manual wheelchair with seat cushion  Once       Comments: BARIATRIC WHEELCHAIR NEEDED. Patient suffers from Acute PE, which impairs their ability to perform daily activities like bathing, dressing, feeding, grooming, and toileting in the home.  A walker will not resolve issue with performing activities  of daily living. A wheelchair will allow patient to safely perform daily activities. Patient can safely propel the wheelchair in the home or has a caregiver who can provide assistance. Length of need 6 months . Accessories: elevating leg rests (ELRs), wheel locks, extensions and anti-tippers.  Patient requires a size for BMI of 53 which is not available in a standard or lightweight wheelchair and patient spends at least two hours per day in their chair.   05/27/21 1048   05/25/21 1410  For home use only DME Walker rolling  Once       Question Answer Comment  Walker: With [place]   Patient needs a walker to treat with the following condition Weakness      05/25/21 1410   05/25/21 1408  For home use only DME 3 n 1  Once        05/25/21 1410            Disposition and follow-up:   KatieIngris C Hainer was discharged from [facility] in Stable condition.  At the hospital follow up visit please address:  - Submassive PE with right heart strain: Ensure patient is taking her Eliquis. Antiphospholipid panel still pending. Please address longevity of treatment needed based on risk/benefit profile.   - Heart Failure with Reduced Ejection Fraction: Please consider repeating TTE. Ensure patient is  taking all her blood pressure and heart failure medications appropriately.   - Type 2 Diabetes Mellitus: Please address insulin regimen   - Hyperlipidemia: LDL above goal. Please consider changing to high intensity statin.   2.  Labs / imaging needed at time of follow-up: CBC, CMP  3.  Pending labs/ test needing follow-up: Antiphospholipid syndrome blood eval  Follow-up Appointments:  Follow-up Information     Adrian Prows, MD. Go on 05/31/2021.   Specialty: Cardiology Why: Go to this appointment after leaving the hospital. The appointment is at 2 pm on 05/31/2021. Contact information: [place] [address] [phone]         Bartholome Bill, MD Follow up on 05/28/2021.   Specialty: Family Medicine Why: Please go to your appointment with your PCP after leaving the hospital. It is at 1:15pm. Contact information: [place] [address] [phone]         Garvin Fila, MD. Schedule an appointment as soon as possible for a visit in 1 day(s).   Specialties: Neurology, Radiology Why: Neurology office will reach out to you regarding an appointment. Contact information: [address] [place] [address] [phone]         Care, [facility] Follow up.   Specialty: Home Health Services Why: Alvis Lemmings will contact you to schedule your first home visit. Contact information: [place] [place] [address] [phone]         Spero Geralds, MD. Go on 06/21/2021.   Specialty: Pulmonary Disease Contact information: [address] [place] [address] [phone]         Renato Shin, MD. Schedule an appointment as soon as possible for a visit in 1 week(s).   Specialty: Endocrinology Contact information: [address] [place] [address] [phone]                Please ensure patient follows up with  Hematology/Oncology outpatient for further workup regarding her hx of strokes and unprovoked PE in setting  of lupus.   Hospital Course by problem list: #Submassive PE #Lupus: Patient presents with shortness of breath and found to have a submassive PE on CTA with detailed results shown below. His Pesi score of 102 that she has had intermittent risk of 30-day mortality.  Patient is mobile at baseline, has not traveled long distances, or had any trauma. Does have a history of lupus, and is currently taking hydroxychloroquine, weekly Benlysta (on Thursdays), and prednisone. She was previously on Plavix, but this was discontinued in the work-up of the liver mass and was never restarted.  She was initially tachycardic and saturating well on 5 L of supplemental oxygen. Pulmonary critical care team was called and they stated no need for lytics right now. IV heparin was started. She was transitioned to oral anticoagulation. Oral anticoagulation was eliquis 10 mg BID for 5 days as she was on IV heparin for 2 days and then transition to 5 mg BID. A ultrasound of the bilateral LE was done that did not show any DVTs. During her stay she was weaned off to RA. Her home hydroxychloroquine, prednisone, and azathioprine was restarted. Physical therapy saw the patient and recommended SNF but patient stated she would be better off going to home as if she went to SNF she would not be able to get home health covered by her insurance. Scheduled all follow up for patient except Hematology/Oncology. Please ensure patient follows up with hematology/oncology. She endorsed nausea prior to discharge and EKG showed Qtc 531. Tigan helped patient with nausea and she was able to tolerate food and medicines well. Please follow up on her nausea as well during her follow up.    HFmrEF HTN Last echo 12/2020 showing ejection fraction of 45 to 50%, with mild ventricular agitation with moderate left ventricular hypertrophy.  Echo 10/22 showed  worsening of EF to 30 to 35% most likely in setting of PE. Medications include Entresto, Coreg, spironolactone, BiDil, and Norvasc.  We held these medications in the setting of submassive PE. Restarted Entresto 97-103 mg BID on 05/24/21. Restarted other medications on 05/25/21 including her Coreg 37.5 BID, Norvasc 10 mg, BiDil 20-37.5 mg per tablet with 2 tabs TID, Spironolactone 50 mg daily, torsemide 20 mg at bedtime. Follow with cardiology was scheduled for 10/31. Patient blood pressure was normotensive at time of discharge.    Hypothyroidism Levothyroxine 50 mcg was continued.   Type 2 DM Her home regimen is metformin 1000 mg twice daily and Victoza 1.8 mg daily and NPH 260 units in the morning. His A1c was 12.9 on 07/22/21. She follows with Dr. Loanne Drilling outpatient. Diabetes coordinator was consulted and she recommended to restart her home regimen at half dose and BID dosing. Instructed to increase to full home dose (260 units per day) if CBGs above 180. Her PO intake was limited due to her complaint of nausea concerning for gastroparesis. Scopolamine patch was ordered as Qtc prolongation limited use of Zofran/Phenergan. Tigan was used as the scopolamine patch effect seemed to wear off quickly. On day of discharge, nausea/vomiting improved.    Stroke Patient was on aspirin 81 mg. Aspirin was discontinued on admission as anticoagulation was started. 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 Dr. Leonie Man was contacted regarding this and he stated this is appropriate. Will have patient follow up with neurology.   Discharge Subjective: No acute concerns endorse. Stated nausea was doing well.   Discharge Exam:   BP 124/62 (BP Location: Right Wrist)   Pulse 85  Temp 98.1 F (36.7 C) (Oral)   Resp 15   Ht 5' 8" (1.727 m)   Wt (!) 159.6 kg   LMP 03/24/2017   SpO2 (!) 80%   BMI 53.50 kg/m   Physical Exam General: NAD Head: Normocephalic without scalp lesions.  Eyes: Conjunctivae  pink, sclerae white, without jaundice.  Mouth :Moist mucus membrane. Neck: Neck supple with full range of motion (ROM). Lungs: CTAB, no wheeze, rhonchi or rales.  Cardiovascular: Normal heart sounds, Abdomen: No TTP, normal bowel sounds MSK: No asymmetry or muscle atrophy. No LE edema present  Neuro: Alert and oriented. CN grossly intact Psych: Normal mood and normal affect   Pertinent Labs, Studies, and Procedures:  CMP Latest Ref Rng & Units 05/23/2021 05/22/2021 05/21/2021  Glucose 70 - 99 mg/dL 235(H) 250(H) 293(H)  BUN 6 - 20 mg/dL _0 Creatinine 0.44 - 1.00 mg/dL 0.90 0.95 1.12(H)  Sodium 135 - 145 mmol/L 136 139 138  Potassium 3.5 - 5.1 mmol/L 3.6 3.6 4.0  Chloride 98 - 111 mmol/L 104 108 107  CO2 22 - 32 mmol/L _1 Calcium 8.9 - 10.3 mg/dL 8.6(L) 8.7(L) 9.5  Total Protein 6.5 - 8.1 g/dL - 5.9(L) 6.4(L)  Total Bilirubin 0.3 - 1.2 mg/dL - 0.4 0.6  Alkaline Phos 38 - 126 U/L - 119 138(H)  AST 15 - 41 U/L - 17 75(H)  ALT 0 - 44 U/L - 14 24    CBC Latest Ref Rng & Units 05/26/2021 05/25/2021 05/24/2021  WBC 4.0 - 10.5 K/uL 9.9 10.0 10.9(H)  Hemoglobin 12.0 - 15.0 g/dL 11.5(L) 11.3(L) 11.2(L)  Hematocrit 36.0 - 46.0 % 36.2 36.4 34.8(L)  Platelets 150 - 400 K/uL 232 188 201  Lipid Panel     Component Value Date/Time   CHOL 203 (H) 05/22/2021 1432   TRIG 138 05/22/2021 1432   HDL 58 05/22/2021 1432   CHOLHDL 3.5 05/22/2021 1432   VLDL 28 05/22/2021 1432   LDLCALC 117 (H) 05/22/2021 1432   DG Chest 2 View  Result Date: 05/21/2021 CLINICAL DATA:  Shortness of breath EXAM: CHEST - 2 VIEW COMPARISON:  IMPRESSION: Mild cardiomegaly. No evidence of acute cardiopulmonary disease. Electronically Signed   By: [name] Hy M.D.   On: 05/21/2021 02:33   CT Angio Chest PE W and/or Wo Contrast  Result Date: 05/21/2021 CLINICAL DATA:  Concern for pulmonary embolism. EXAM: CT ANGIOGRAPHY CHEST WITH CONTRAST TECHNIQUE:  IMPRESSION: 1. Bilateral occlusive acute  pulmonary emboli within the proximal segmental pulmonary arteries. Overall clot burden is severe. Evidence of RIGHT ventricular strain (RV/LV Ratio = 1.15) consistent with at least submassive (intermediate risk) PE. The presence of right heart strain has been associated with an increased risk of morbidity and mortality. Please refer to the "PE Focused" order set in EPIC. 2. No pulmonary infarction. Critical Value/emergent results were called by telephone at the time of interpretation on 05/21/2021 at 7:23 am to provider Ronnald Nian, MD, who verbally acknowledged these results. Electronically Signed   By: Suzy Bouchard M.D.   On: 05/21/2021 07:24   ECHOCARDIOGRAM COMPLETE  Result Date: 05/21/2021    ECHOCARDIOGRAM REPORT   Patient Name:   KARLA PAVONE Date of Exam: 05/21/2021 Medical Rec #:  [id number]     Height:       68.0 in Accession #:    [id number]    Weight:       351.9 lb Date of Birth:  August 08, 1970     BSA:  2.598 m Patient Age:    60 years      BP:           98/86 mmHg Patient Gender: F             HR:           110 bpm. Exam Location:  Inpatient Procedure: 2D Echo, Cardiac Doppler, Color Doppler and Intracardiac            Opacification Agent Indications:    Pulmonary Embolis  History:        Patient has prior history of Echocardiogram examinations. CHF;                 Risk Factors:Diabetes and Hypertension.  Sonographer:    Danne Baxter RDCS, FE, PE Referring Phys: [id number] ADAM CURATOLO IMPRESSIONS  1. Left ventricular ejection fraction, by estimation, is 30 to 35%. The left ventricle has moderately decreased function. The left ventricle demonstrates global hypokinesis. There is mild left ventricular hypertrophy. Left ventricular diastolic parameters are consistent with Grade I diastolic dysfunction (impaired relaxation). There is the interventricular septum is flattened in systole and diastole, consistent with right ventricular pressure and volume overload.  2. Right ventricular systolic  function is severely reduced. The right ventricular size is normal.  3. The mitral valve is normal in structure. Trivial mitral valve regurgitation. No evidence of mitral stenosis.  4. The aortic valve is tricuspid. Aortic valve regurgitation is not visualized. No aortic stenosis is present. FINDINGS  Left Ventricle: Left ventricular ejection fraction, by estimation, is 30 to 35%. The left ventricle has moderately decreased function. The left ventricle demonstrates global hypokinesis. Definity contrast agent was given IV to delineate the left ventricular endocardial borders. The left ventricular internal cavity size was normal in size. There is mild left ventricular hypertrophy. The interventricular septum is flattened in systole and diastole, consistent with right ventricular pressure and volume overload. Left ventricular diastolic parameters are consistent with Grade I diastolic dysfunction (impaired relaxation). Right Ventricle: The right ventricular size is normal. Right ventricular systolic function is severely reduced. Left Atrium: Left atrial size was normal in size. Right Atrium: Right atrial size was normal in size. Pericardium: There is no evidence of pericardial effusion. Mitral Valve: The mitral valve is normal in structure. Trivial mitral valve regurgitation. No evidence of mitral valve stenosis. Tricuspid Valve: The tricuspid valve is normal in structure. Tricuspid valve regurgitation is mild . No evidence of tricuspid stenosis. Aortic Valve: The aortic valve is tricuspid. Aortic valve regurgitation is not visualized. No aortic stenosis is present. Pulmonic Valve: The pulmonic valve was normal in structure. Pulmonic valve regurgitation is trivial. No evidence of pulmonic stenosis. Aorta: The aortic root is normal in size and structure. Venous: The inferior vena cava was not well visualized. IAS/Shunts: The interatrial septum was not well visualized.  LEFT VENTRICLE PLAX 2D LVIDd:         5.30 cm    Diastology LVIDs:         4.00 cm   LV e' medial:    7.22 cm/s LV PW:         1.10 cm   LV E/e' medial:  1.0 LV IVS:        1.20 cm   LV e' lateral:   5.51 cm/s LVOT diam:     2.40 cm   LV E/e' lateral: 1.3 LVOT Area:     4.52 cm  RIGHT VENTRICLE RV S prime:     6.89 cm/s TAPSE (  M-mode): 1.8 cm LEFT ATRIUM           Index        RIGHT ATRIUM           Index LA diam:      3.10 cm 1.19 cm/m   RA Area:     22.00 cm LA Vol (A4C): 42.9 ml 16.51 ml/m  RA Volume:   69.70 ml  26.83 ml/m   AORTA Ao Root diam: 3.20 cm Ao Asc diam:  3.20 cm MV E velocity: 6.89 cm/s  TRICUSPID VALVE                           TR Peak grad:   31.6 mmHg                           TR Vmax:        281.00 cm/s                            SHUNTS                           Systemic Diam: 2.40 cm Kirk Ruths MD Electronically signed by Kirk Ruths MD Signature Date/Time: 05/21/2021/2:40:40 PM    Final    VAS [place] LOWER EXTREMITY VENOUS (DVT)  Result Date: 05/22/2021  Lower Venous DVT Study Patient Name:  [name]  Date of Exam:   05/22/2021 Medical Rec #: [id number]      Accession #:    [id number] Date of Birth: 01-Sep-1970      Patient Gender: F Patient Age:   21 years Exam Location:  [facility] Procedure:      VAS [place] LOWER EXTREMITY VENOUS (DVT) Referring Phys: CHI ELLISON --------------------------------------------------------------------------------  Indications: Pulmonary embolism.  Comparison Study: 03/26/18 prior Performing Technologist: Archie Patten RVS  Examination Guidelines: A complete evaluation includes B-mode imaging, spectral Doppler, color Doppler, and power Doppler as needed of all accessible portions of each vessel. Bilateral testing is considered an integral part of a complete examination. Limited examinations for reoccurring indications may be performed as noted. The reflux portion of the exam is performed with the patient in reverse Trendelenburg.   +---------+---------------+---------+-----------+----------+-------------------+ RIGHT    CompressibilityPhasicitySpontaneityPropertiesThrombus Aging      +---------+---------------+---------+-----------+----------+-------------------+ CFV      Full           Yes      Yes                                      +---------+---------------+---------+-----------+----------+-------------------+ SFJ      Full                                                             +---------+---------------+---------+-----------+----------+-------------------+ FV Prox  Full                                                             +---------+---------------+---------+-----------+----------+-------------------+  FV Mid   Full                                                             +---------+---------------+---------+-----------+----------+-------------------+ FV DistalFull                                                             +---------+---------------+---------+-----------+----------+-------------------+ PFV      Full                                                             +---------+---------------+---------+-----------+----------+-------------------+ POP      Full           Yes      Yes                                      +---------+---------------+---------+-----------+----------+-------------------+ PTV      Full                                                             +---------+---------------+---------+-----------+----------+-------------------+ PERO                                                  Not well visualized +---------+---------------+---------+-----------+----------+-------------------+   +---------+---------------+---------+-----------+----------+-------------------+ LEFT     CompressibilityPhasicitySpontaneityPropertiesThrombus Aging      +---------+---------------+---------+-----------+----------+-------------------+  CFV      Full           Yes      Yes                                      +---------+---------------+---------+-----------+----------+-------------------+ SFJ      Full                                                             +---------+---------------+---------+-----------+----------+-------------------+ FV Prox  Full                                                             +---------+---------------+---------+-----------+----------+-------------------+ FV Mid   Full                                                             +---------+---------------+---------+-----------+----------+-------------------+  FV DistalFull                                                             +---------+---------------+---------+-----------+----------+-------------------+ PFV      Full                                                             +---------+---------------+---------+-----------+----------+-------------------+ POP      Full           Yes      Yes                                      +---------+---------------+---------+-----------+----------+-------------------+ PTV      Full                                                             +---------+---------------+---------+-----------+----------+-------------------+ PERO                                                  Not well visualized +---------+---------------+---------+-----------+----------+-------------------+     Summary: BILATERAL: - No evidence of deep vein thrombosis seen in the lower extremities, bilaterally. -No evidence of popliteal cyst, bilaterally.   *See table(s) above for measurements and observations. Electronically signed by Deitra Mayo MD on 05/22/2021 at 12:27:35 PM.    Final     Discharge Instructions: Discharge Instructions     Call MD for:  difficulty breathing, headache or visual disturbances   Complete by: As directed    Call MD for:  persistant dizziness or  light-headedness   Complete by: As directed    Call MD for:  persistant nausea and vomiting   Complete by: As directed    Call MD for:  severe uncontrolled pain   Complete by: As directed    Call MD for:  temperature >100.4   Complete by: As directed    Diet - low sodium heart healthy   Complete by: As directed    Discharge instructions   Complete by: As directed    [name]   You were admitted to the hospital due to a blood clot in your lungs, called a Pulmonary Embolism. To treat this, a blood thinner called Eliquis was started. Please take this twice a day.   It will be very important for you to follow up with several specialists, including:   - Lung doctor (Pulmonologist)  - Dr. Loanne Drilling regarding your diabetes and potential gastroparesis. While here, we split your insulin dosing to twice per day. I recommend discussing this further with Dr. Loanne Drilling.   - Your primary care doctor. Consider discussing a referral to a blood specialist, called Hematologist.   - Heart Doctor (cardiologist)  It was a pleasure meeting you and we wish you the best!   Sincerely, Dr. Charleen Kirks   Increase activity slowly   Complete by: As directed        Signed: Idamae Schuller, MD Tillie Rung. [facility] Internal Medicine Residency, PGY-1  05/27/2021, 2:48 PM   Pager: [phone]

## 2021-05-26 NOTE — Progress Notes (Signed)
OT Cancellation Note  Patient Details Name: [name] MRN: [id number] DOB: 04/05/1971   Cancelled Treatment:    Reason Eval/Treat Not Completed: Patient declined, no reason specified (patient eating lunch. States she has been nauseous and up most of night.  Will attempt again at later time.) Lodema Hong, [place]  Pager [phone] Office [phone]  Trixie Dredge 05/26/2021, 1:11 PM

## 2021-05-27 ENCOUNTER — Other Ambulatory Visit (HOSPITAL_COMMUNITY): Payer: Self-pay

## 2021-05-27 DIAGNOSIS — I2609 Other pulmonary embolism with acute cor pulmonale: Secondary | ICD-10-CM | POA: Diagnosis not present

## 2021-05-27 LAB — GLUCOSE, CAPILLARY
Glucose-Capillary: 174 mg/dL — ABNORMAL HIGH (ref 70–99)
Glucose-Capillary: 179 mg/dL — ABNORMAL HIGH (ref 70–99)
Glucose-Capillary: 199 mg/dL — ABNORMAL HIGH (ref 70–99)
Glucose-Capillary: 234 mg/dL — ABNORMAL HIGH (ref 70–99)

## 2021-05-27 MED ORDER — SCOPOLAMINE 1 MG/3DAYS TD PT72
1.0000 | MEDICATED_PATCH | TRANSDERMAL | 0 refills | Status: DC | PRN
  Filled 2021-05-27: qty 10, 30d supply, fill #0

## 2021-05-27 MED ORDER — APIXABAN 5 MG PO TABS
5.0000 mg | ORAL_TABLET | Freq: Two times a day (BID) | ORAL | 0 refills | Status: DC
  Filled 2021-05-27: qty 60, 30d supply, fill #0

## 2021-05-27 MED ORDER — HUMULIN N KWIKPEN 100 UNIT/ML ~~LOC~~ SUPN
20.0000 [IU] | PEN_INJECTOR | Freq: Two times a day (BID) | SUBCUTANEOUS | 0 refills | Status: DC
Start: 2021-05-27 — End: 2021-06-02
  Filled 2021-05-27: qty 12, 30d supply, fill #0

## 2021-05-27 MED ORDER — TRIMETHOBENZAMIDE HCL 300 MG PO CAPS
300.0000 mg | ORAL_CAPSULE | Freq: Three times a day (TID) | ORAL | 0 refills | Status: DC | PRN
  Filled 2021-05-27: qty 10, 4d supply, fill #0

## 2021-05-27 MED ORDER — AMLODIPINE BESYLATE 10 MG PO TABS
10.0000 mg | ORAL_TABLET | Freq: Every day | ORAL | 0 refills | Status: DC
  Filled 2021-05-27: qty 30, 30d supply, fill #0

## 2021-05-27 MED ORDER — ROSUVASTATIN CALCIUM 20 MG PO TABS
20.0000 mg | ORAL_TABLET | Freq: Every day | ORAL | 0 refills | Status: DC
  Filled 2021-05-27: qty 30, 30d supply, fill #0

## 2021-05-27 MED ORDER — TRIMETHOBENZAMIDE HCL 300 MG PO CAPS
300.0000 mg | ORAL_CAPSULE | Freq: Once | ORAL | Status: AC
  Administered 2021-05-27: 300 mg via ORAL
  Filled 2021-05-27: qty 1

## 2021-05-27 NOTE — Progress Notes (Addendum)
Occupational Therapy Treatment Patient Details Name: [name] MRN: [id number] DOB: 09-May-1971 Today's Date: 05/27/2021   History of present illness 50 y/o female admitted secondary to worsening SOB. Found to have a pulmonary embolism. PMH of lupus, Sjogren's syndrome, congestive heart failure, type 2 diabetes, HLD, HTN, obesity, OSA, and stroke.   OT comments  Patient received in bed and states her nausea has improved but continues to have a headache.  Patient required increased time to get eob, ambulate in room, and transfers. Patient performed grooming at sink, standing for oral care and completing seated. Patient is expected to discharge home today with [facility].    Recommendations for follow up therapy are one component of a multi-disciplinary discharge planning process, led by the attending physician.  Recommendations may be updated based on patient status, additional functional criteria and insurance authorization.    Follow Up Recommendations  Home health OT    Assistance Recommended at Discharge Frequent or constant Supervision/Assistance  Equipment Recommendations  None recommended by OT    Recommendations for Other Services      Precautions / Restrictions Precautions Precautions: Fall Precaution Comments: dizzy, watch BP       Mobility Bed Mobility Overal bed mobility: Needs Assistance Bed Mobility: Supine to Sit;Sit to Supine     Supine to sit: Supervision Sit to supine: Supervision   General bed mobility comments: increased time to perform    Transfers Overall transfer level: Needs assistance Equipment used: Rolling walker (2 wheels) Transfers: Sit to/from Stand;Stand Pivot Transfers Sit to Stand: Supervision Stand pivot transfers: Min guard         General transfer comment: min guard for toilet transfer for safety     Balance Overall balance assessment: Needs assistance Sitting-balance support: No upper extremity supported Sitting balance-Leahy  Scale: Good Sitting balance - Comments: able to sit on eob   Standing balance support: Single extremity supported;During functional activity Standing balance-Leahy Scale: Fair Standing balance comment: able to stand at sink for oral hygiene                           ADL either performed or assessed with clinical judgement   ADL Overall ADL's : Needs assistance/impaired     Grooming: Supervision/safety;Sitting;Standing;Oral care;Wash/dry face;Wash/dry hands Grooming Details (indicate cue type and reason): patient performed grooming standing and sitting at sink.                 Toilet Transfer: Financial planner (2 wheels) Armed forces technical officer Details (indicate cue type and reason): min guard for safety Toileting- Clothing Manipulation and Hygiene: Modified independent;Sitting/lateral lean       Functional mobility during ADLs: Min guard;Rolling walker (2 wheels) General ADL Comments: min guard for mobility for safety     Vision       Perception     Praxis      Cognition Arousal/Alertness: Awake/alert Behavior During Therapy: Flat affect Overall Cognitive Status: Impaired/Different from baseline Area of Impairment: Problem solving;Safety/judgement                         Safety/Judgement: Decreased awareness of safety   Problem Solving: Slow processing General Comments: extra time to process and perform activities          Exercises     Shoulder Instructions       General Comments      Pertinent Vitals/ Pain  Pain Assessment: Faces Faces Pain Scale: Hurts a little bit Pain Location: headache Pain Descriptors / Indicators: Aching Pain Intervention(s): Premedicated before session  Home Living                                          Prior Functioning/Environment              Frequency  Min 2X/week        Progress Toward Goals  OT Goals(current goals can now be found in the  care plan section)  Progress towards OT goals: Progressing toward goals  Acute Rehab OT Goals Patient Stated Goal: go home OT Goal Formulation: With patient Time For Goal Achievement: 06/07/21 Potential to Achieve Goals: Good ADL Goals Pt Will Perform Grooming: with set-up;sitting Pt Will Perform Upper Body Dressing: with min assist;sitting Pt Will Perform Lower Body Dressing: with min assist;sit to/from stand Pt Will Transfer to Toilet: with min guard assist;ambulating Pt Will Perform Toileting - Clothing Manipulation and hygiene: with min guard assist;sitting/lateral leans;sit to/from stand  Plan Discharge plan remains appropriate    Co-evaluation                 AM-PAC OT "6 Clicks" Daily Activity     Outcome Measure   Help from another person eating meals?: None Help from another person taking care of personal grooming?: A Little Help from another person toileting, which includes using toliet, bedpan, or urinal?: A Little Help from another person bathing (including washing, rinsing, drying)?: A Little Help from another person to put on and taking off regular upper body clothing?: A Little Help from another person to put on and taking off regular lower body clothing?: A Lot 6 Click Score: 18    End of Session Equipment Utilized During Treatment: Rolling walker (2 wheels)  OT Visit Diagnosis: Unsteadiness on feet (R26.81);Other abnormalities of gait and mobility (R26.89);Muscle weakness (generalized) (M62.81);Pain   Activity Tolerance Patient limited by fatigue   Patient Left in bed;with call bell/phone within reach;with bed alarm set   Nurse Communication Mobility status        Time: 6948-5462 OT Time Calculation (min): 20 min  Charges: OT General Charges $OT Visit: 1 Visit OT Treatments $Self Care/Home Management : 8-22 mins  Lodema Hong, [place]  Pager [phone] Office [place] 05/27/2021, 10:30 AM

## 2021-05-27 NOTE — TOC Transition Note (Signed)
Transition of Care [facility]) - CM/SW Discharge Note   Patient Details  Name: [name] MRN: [id number] Date of Birth: 05/26/1971  Transition of [facility]) CM/SW Contact:  Joanne Chars, LCSW Phone Number: 05/27/2021, 1:03 PM   Clinical Narrative:   Pt discharging home with [facility].  [place] will resume [facility] aide services.  CSW confirmed wheelchair has been delivered by [employer].  Pt reports her sister will be on the way shortly to pick her up.     Final next level of care: [place] Barriers to Discharge: Barriers Resolved   Patient Goals and CMS Choice Patient states their goals for this hospitalization and ongoing recovery are:: "I really need to go home, I can't be away for that long."      Discharge Placement                       Discharge Plan and Services     Post Acute Care Choice: Home Health          DME Arranged: Wheelchair manual DME Agency: [employer] Date DME Agency Contacted: 05/27/21 Time DME Agency Contacted: [phone] Representative spoke with at DME Agency: Mardene Celeste HH Arranged: PT, OT [facility] Agency: [place] Date [place]: 05/24/21 Time [place]: [place] Representative spoke with at [place]: [place] ([place]) Interventions     Readmission Risk Interventions Readmission Risk Prevention Plan 05/26/2021 02/05/2020  Transportation Screening Complete Complete  Medication Review Press photographer) - Referral to Pharmacy  PCP or Specialist appointment within 3-5 days of discharge Complete -  [place] or [place] Complete Complete  SW Recovery Care/Counseling Consult Complete Complete  [place] Not Applicable Not Applicable  Some recent data might be hidden

## 2021-05-27 NOTE — Progress Notes (Signed)
SATURATION QUALIFICATIONS: (This note is used to comply with regulatory documentation for home oxygen)  Patient Saturations on Room Air at Rest = 100%  Patient Saturations on Room Air while Ambulating = 100%   Please briefly explain why patient needs home oxygen:pt walked 182ft stated she was short of breath but O2 saturations remained 100% on room air.

## 2021-05-27 NOTE — Progress Notes (Signed)
CSW spoke with [name] at [facility] and ordered bariatric manuel wheelchair, which they will deliver to pt hospital room. Lurline Idol, MSW, LCSW 10/27/202210:55 AM

## 2021-05-28 ENCOUNTER — Other Ambulatory Visit (HOSPITAL_COMMUNITY): Payer: Self-pay

## 2021-05-31 ENCOUNTER — Ambulatory Visit: Payer: Medicaid Other | Admitting: Student

## 2021-06-01 LAB — ANTIPHOSPHOLIPID SYNDROME EVAL, BLD
Anticardiolipin IgA: <9 APL U/mL (ref 0–11)
Anticardiolipin IgG: <9 GPL U/mL (ref 0–14)
Anticardiolipin IgM: <9 MPL U/mL (ref 0–12)
DRVVT: 40.3 s (ref 0.0–47.0)
PTT Lupus Anticoagulant: 35.4 s (ref 0.0–51.9)
Phosphatydalserine, IgA: 1 APS Units (ref 0–19)
Phosphatydalserine, IgG: 9 Units (ref 0–30)
Phosphatydalserine, IgM: <10 Units (ref 0–30)

## 2021-06-02 ENCOUNTER — Ambulatory Visit (INDEPENDENT_AMBULATORY_CARE_PROVIDER_SITE_OTHER): Payer: Medicaid Other | Admitting: Endocrinology

## 2021-06-02 ENCOUNTER — Other Ambulatory Visit: Payer: Self-pay

## 2021-06-02 VITALS — BP 140/80 | HR 105 | Ht 68.0 in | Wt 333.4 lb

## 2021-06-02 DIAGNOSIS — E1143 Type 2 diabetes mellitus with diabetic autonomic (poly)neuropathy: Secondary | ICD-10-CM | POA: Diagnosis not present

## 2021-06-02 DIAGNOSIS — Z794 Long term (current) use of insulin: Secondary | ICD-10-CM | POA: Diagnosis not present

## 2021-06-02 DIAGNOSIS — E1169 Type 2 diabetes mellitus with other specified complication: Secondary | ICD-10-CM | POA: Diagnosis not present

## 2021-06-02 DIAGNOSIS — E785 Hyperlipidemia, unspecified: Secondary | ICD-10-CM | POA: Diagnosis not present

## 2021-06-02 LAB — POCT GLYCOSYLATED HEMOGLOBIN (HGB A1C): Hemoglobin A1C: 12.2 % — AB (ref 4.0–5.6)

## 2021-06-02 MED ORDER — HUMULIN N KWIKPEN 100 UNIT/ML ~~LOC~~ SUPN: 260.0000 [IU] | PEN_INJECTOR | SUBCUTANEOUS | 3 refills | Status: DC

## 2021-06-02 NOTE — Progress Notes (Signed)
Subjective:    Patient ID: [name], female    DOB: [birth year], 50 y.o.   MRN: [id number]  HPI Pt returns for f/u of diabetes mellitus: DM type: Insulin-requiring type 2 Dx'ed: 3491 Complications: polyneuropathy and gastroparesis.  Therapy: insulin (since 2014), Victoza, and metformin.   GDM: never.  DKA: never.  Severe hypoglycemia: never.  Pancreatitis: never.  Other: she is too ill to undergo weight-loss surgery; she takes qd insulin, after poor results with multiple daily injections; she takes NPH, due to pattern of cbg's.   Interval history: Pt says she has been skipping insulin because she sleeps late.  She takes NPH insulin 260 units qam.  no cbg record, but states cbg's vary from 115-179.   Past Medical History:  Diagnosis Date   Acute renal failure (HCC)     Intractable nausea vomiting secondary to diabetic gastroparesis causing dehydration and acute renal failure /notes 04/01/2013   Anginal pain ([place])    Anxiety    Bell's palsy 08/09/2010   Bicornuate uterus    CAP (community acquired pneumonia) 10/2011   Archie Endo 11/08/2011   Chest pain 01/13/2014   CHF (congestive heart failure) ([place])    Diabetic gastroparesis associated with type 2 diabetes mellitus ([place])    this is presumed diagnoses, not confirmed by any studies.    Eczema    Family history of malignant neoplasm of breast    Family history of malignant neoplasm of ovary    GERD (gastroesophageal reflux disease)    High cholesterol    Hypertension    Liver lesion 08/13/2019   Liver mass 2014   biopsied 03/2013 at [facility], not malignant.  is to undergo radiologic ablation of the mass in sept/October 2014.    Lupus ([place])    Migraines    "maybe a couple times/yr" (04/01/2013)   Obesity    Obstructive sleep apnea on CPAP 2011   Oxygen at nights   SJOGREN'S SYNDROME 08/09/2010   SLE (systemic lupus erythematosus) ([place])    Archie Endo 12/01/2010   Stroke ([place]) 2010; 10/2012   "left side is still weak from it, never  fully regained full strength; no additions from stroke 10/2012"    Past Surgical History:  Procedure Laterality Date   BREAST CYST EXCISION Left 08/2005   epidermoid   CARDIAC CATHETERIZATION  02/07/12   ECTOPIC PREGNANCY SURGERY  1999   ECTOPIC PREGNANCY SURGERY  1999   ESOPHAGOGASTRODUODENOSCOPY N/A 02/26/2013   Procedure: ESOPHAGOGASTRODUODENOSCOPY (EGD);  Surgeon: Irene Shipper, MD;  Location: [facility] ENDOSCOPY;  Service: Endoscopy;  Laterality: N/A;   ESOPHAGOGASTRODUODENOSCOPY N/A 03/02/2015   Procedure: ESOPHAGOGASTRODUODENOSCOPY (EGD);  Surgeon: Milus Banister, MD;  Location: [place];  Service: Endoscopy;  Laterality: N/A;   ESOPHAGOGASTRODUODENOSCOPY N/A 02/23/2021   Procedure: ESOPHAGOGASTRODUODENOSCOPY (EGD);  Surgeon: Wilford Corner, MD;  Location: [place];  Service: Endoscopy;  Laterality: N/A;  possible dilation   HERNIA REPAIR     LEFT AND RIGHT HEART CATHETERIZATION WITH CORONARY ANGIOGRAM N/A 02/07/2012   Procedure: LEFT AND RIGHT HEART CATHETERIZATION WITH CORONARY ANGIOGRAM;  Surgeon: Laverda Page, MD;  Location: [facility] CATH LAB;  Service: Cardiovascular;  Laterality: N/A;   LIVER BIOPSY  03/2013   liver mass/medical hx noted above   MUSCLE BIOPSY     for lupus/notes 02/07/1504   UMBILICAL HERNIA REPAIR  1980's    Social History   Socioeconomic History   Marital status: Single    Spouse name: Not on file   Number of children: 1  Years of education: Not on file   Highest education level: Not on file  Occupational History    Employer: UNEMPLOYED    Comment: student  Tobacco Use   Smoking status: Former    Packs/day: 0.50    Years: 3.00    Pack years: 1.50    Types: Cigarettes    Quit date: 06/02/1991    Years since quitting: 30.0   Smokeless tobacco: Never   Tobacco comments:    quit 28 yrs ago  Vaping Use   Vaping Use: Never used  Substance and Sexual Activity   Alcohol use: No    Alcohol/week: 0.0 standard drinks   Drug use: No   Sexual activity:  Yes    Partners: Male    Birth control/protection: None  Other Topics Concern   Not on file  Social History Narrative   Regular exercise-yes   Social Determinants of Health   Financial Resource Strain: Not on file  Food Insecurity: Not on file  Transportation Needs: Not on file  Physical Activity: Not on file  Stress: Not on file  Social Connections: Not on file  Intimate Partner Violence: Not on file    Current Outpatient Medications on File Prior to Visit  Medication Sig Dispense Refill   ACCU-CHEK AVIVA PLUS test strip USE TO TEST BLOOD SUGAR 4 TIMES DAILY 100 strip 2   ADVAIR HFA 230-21 MCG/ACT inhaler Inhale 1-2 puffs into the lungs 2 (two) times daily.     albuterol (PROVENTIL) (2.5 MG/3ML) 0.083% nebulizer solution Take 2.5 mg by nebulization every 6 (six) hours as needed for wheezing.     ALPRAZolam (XANAX) 0.5 MG tablet Take 0.5 mg 2 (two) times daily as needed by mouth for anxiety or sleep.      amLODipine (NORVASC) 10 MG tablet Take 1 tablet (10 mg total) by mouth daily. 30 tablet 0   apixaban (ELIQUIS) 5 MG TABS tablet Take 1 tablet (5 mg total) by mouth 2 (two) times daily. 60 tablet 0   ARIPiprazole (ABILIFY) 5 MG tablet Take 5 mg by mouth daily.     Azathioprine 75 MG TABS Take 150 mg by mouth 2 (two) times daily.     B-D UF III MINI PEN NEEDLES 31G X 5 MM MISC USE AS DIRECTED TWICE A DAY 100 each 2   Belimumab (BENLYSTA) 200 MG/ML SOAJ Inject 200 mg into the skin once a week. Thursdays     Blood Glucose Monitoring Suppl (ONETOUCH VERIO) w/Device KIT Test 4 times daily.  DX E11.9 1 kit 1   carvedilol (COREG) 25 MG tablet Take 1.5 tablets (37.5 mg total) by mouth 2 (two) times daily with a meal. 270 tablet 3   cromolyn (OPTICROM) 4 % ophthalmic solution Place 1 drop into both eyes 2 (two) times daily.     cycloSPORINE (RESTASIS) 0.05 % ophthalmic emulsion Place 1 drop into both eyes in the morning, at noon, and at bedtime.      diclofenac Sodium (VOLTAREN) 1 % GEL  Apply 2 g topically 4 (four) times daily. 350 g 0   DULoxetine (CYMBALTA) 60 MG capsule Take 120 mg by mouth at bedtime.     EASY TOUCH PEN NEEDLES 32G X 4 MM MISC USE TO INJECT INSULIN TWICE DAILY 100 each 9   ENTRESTO 97-103 MG TAKE 1 TABLET BY MOUTH 2 (TWO) TIMES DAILY. (Patient taking differently: Take 1 tablet by mouth 2 (two) times daily.) 180 tablet 3   famotidine (PEPCID) 20 MG tablet Take  20 mg by mouth 2 (two) times daily.     fluticasone (FLONASE) 50 MCG/ACT nasal spray Place 2 sprays into both nostrils continuous as needed for allergies or rhinitis.     glucose blood (ONETOUCH VERIO) test strip Test 4 times daily DX E11.9 100 each 12   glucose blood test strip Test 4 times daily DX E11.9 100 each 12   hydroxychloroquine (PLAQUENIL) 200 MG tablet Take 200 mg by mouth 2 (two) times daily.     Insulin Syringe-Needle U-100 (EASY TOUCH INSULIN SYRINGE) 31G X 5/16" 1 ML MISC USE AS DIRECTED THREE TIMES A DAY 100 each 10   isosorbide-hydrALAZINE (BIDIL) 20-37.5 MG tablet TAKE TWO (2) TABLETS BY MOUTH THREE TIMES DAILY (Patient taking differently: Take 2 tablets by mouth 3 (three) times daily.) 270 tablet 3   Lancets (ONETOUCH ULTRASOFT) lancets Test 4 times daily DX E11.9 100 each 12   levothyroxine (SYNTHROID) 50 MCG tablet Take 1 tablet (50 mcg total) by mouth daily at 6 (six) AM.     megestrol (MEGACE) 40 MG tablet Take 40 mg by mouth daily.  3   metFORMIN (GLUCOPHAGE-XR) 500 MG 24 hr tablet Take 2 tablets (1,000 mg total) by mouth 2 (two) times daily. 180 tablet 3   Multiple Vitamin (MULTIVITAMIN WITH MINERALS) TABS tablet Take 1 tablet by mouth daily.     oxybutynin (DITROPAN XL) 15 MG 24 hr tablet Take 15 mg by mouth daily.      predniSONE (DELTASONE) 1 MG tablet Take 2 mg by mouth daily.     pregabalin (LYRICA) 75 MG capsule Take 75 mg 3 (three) times daily by mouth.      QUEtiapine (SEROQUEL) 300 MG tablet Take 300 mg by mouth at bedtime.     rosuvastatin (CRESTOR) 20 MG tablet Take  1 tablet (20 mg total) by mouth daily. 30 tablet 0   scopolamine (TRANSDERM-SCOP) 1 MG/3DAYS Place 1 patch (1.5 mg total) onto the skin every 3 (three) days as needed (nausea). Do not wear for more than 72 hours. 10 patch 0   spironolactone (ALDACTONE) 50 MG tablet Take 1 tablet (50 mg total) by mouth daily. 90 tablet 3   sucralfate (CARAFATE) 1 GM/10ML suspension Take 10 mLs by mouth 2 (two) times daily.     topiramate (TOPAMAX) 25 MG tablet Take 75 mg by mouth at bedtime.     torsemide (DEMADEX) 20 MG tablet Take 20 mg by mouth at bedtime.     trimethobenzamide (TIGAN) 300 MG capsule Take 1 capsule (300 mg total) by mouth 3 (three) times daily as needed for nausea/vomiting. Do not take with the Scopolamine unless nausea is not improving 10 capsule 0   VICTOZA 18 MG/3ML SOPN INJECT 1.8 MG UNDER THE SKIN ONCE DAILY IN THE MORNING (Patient taking differently: Inject 1.8 mg into the skin daily.) 6 mL 1   No current facility-administered medications on file prior to visit.    Allergies  Allergen Reactions   Metoclopramide Other (See Comments)    Developed restless leg, akathisia type limb movements.    Codeine Itching, Rash and Other (See Comments)   Hydrocodone Rash   Penicillins Itching    Tolerated Unasyn 02/01/2020   Percocet [Oxycodone-Acetaminophen] Hives and Rash    Tolerates dilaudid Tolerates acetaminophen     Family History  Problem Relation Age of Onset   Diabetes Mother    Asthma Mother    Urticaria Mother    Hypertension Mother    Breast cancer Maternal Aunt  34       currently 61   Stomach cancer Maternal Uncle        dx 54s; deceased   Prostate cancer Maternal Uncle        deceased 42s   Cancer Maternal Aunt        unk. primary   Cancer Maternal Aunt        unk. primary   Ovarian cancer Maternal Aunt        deceased 54s   Hypertension Other    Stroke Other    Arthritis Other    Ovarian cancer Sister 66       maternal half-sister; RAD51C positive   Cancer  Paternal Aunt        unk. primary; deceased 78s   Prostate cancer Paternal Uncle        deceased 68s   Breast cancer Cousin        deceased 46s; daughter of mat uncle with stomach ca   Breast cancer Maternal Aunt        deceased 80s   Asthma Brother    Asthma Brother    Allergic rhinitis Neg Hx    Angioedema Neg Hx     BP 140/80 (BP Location: Right Arm, Patient Position: Sitting, Cuff Size: Large)   Pulse (!) 105   Ht _0  (1.727 m)   Wt (!) 333 lb 6.4 oz (151.2 kg)   LMP 03/24/2017   SpO2 99%   BMI 50.69 kg/m    Review of Systems She denies hypoglycemia.      Objective:   Physical Exam   Lab Results  Component Value Date   HGBA1C 12.2 (A) 06/02/2021      Assessment & Plan:  Insulin-requiring type 2 DM: severe exacerbation. Noncompliance with insulin: pt is at risk for hypoglycemia if there is sudden improvement in med compliance.  Patient Instructions  check your blood sugar 4 times a day: before the 3 meals, and at bedtime.  also check if you have symptoms of your blood sugar being too high or too low.  please keep a record of the readings and bring it to your next appointment here (or you can bring the meter itself).  You can write it on any piece of paper.  please call us sooner if your blood sugar goes below 70, or if you have a lot of readings over 200, especially if you have to increase the prednisone again.   I have sent a prescription to your pharmacy, to increase the insulin to 260 units when you get up, whenever that is.   continue the same other medications for now.  On this type of insulin schedule, you should eat meals on a regular schedule (especially lunch).  If a meal is missed or significantly delayed, your blood sugar could go low.   We are placing a professional continuous glucose monitor today.   Please come back for a follow-up appointment in 2 weeks.

## 2021-06-02 NOTE — Patient Instructions (Addendum)
check your blood sugar 4 times a day: before the 3 meals, and at bedtime.  also check if you have symptoms of your blood sugar being too high or too low.  please keep a record of the readings and bring it to your next appointment here (or you can bring the meter itself).  You can write it on any piece of paper.  please call us sooner if your blood sugar goes below 70, or if you have a lot of readings over 200, especially if you have to increase the prednisone again.   I have sent a prescription to your pharmacy, to increase the insulin to 260 units when you get up, whenever that is.   continue the same other medications for now.  On this type of insulin schedule, you should eat meals on a regular schedule (especially lunch).  If a meal is missed or significantly delayed, your blood sugar could go low.   We are placing a professional continuous glucose monitor today.   Please come back for a follow-up appointment in 2 weeks.

## 2021-06-03 DIAGNOSIS — K224 Dyskinesia of esophagus: Secondary | ICD-10-CM | POA: Insufficient documentation

## 2021-06-04 ENCOUNTER — Telehealth: Payer: Self-pay | Admitting: Physician Assistant

## 2021-06-04 NOTE — Telephone Encounter (Signed)
Scheduled appt per 11/4 referral. Pt is aware of appt date and time.  

## 2021-06-08 ENCOUNTER — Other Ambulatory Visit (HOSPITAL_COMMUNITY): Payer: Self-pay

## 2021-06-08 ENCOUNTER — Telehealth (HOSPITAL_COMMUNITY): Payer: Self-pay | Admitting: Pharmacist

## 2021-06-08 ENCOUNTER — Other Ambulatory Visit: Payer: Self-pay | Admitting: Internal Medicine

## 2021-06-08 NOTE — Telephone Encounter (Signed)
Pharmacy Transitions of Care Follow-up Telephone Call  Date of discharge:  05/27/21 Discharge Diagnosis: PE  How have you been since you were released from the hospital?  Doing fine  Medication changes made at discharge:      START taking: Eliquis (apixaban)  rosuvastatin (CRESTOR)  Transderm-Scop (scopolamine)  trimethobenzamide (Tigan)  STOP taking: aspirin EC 81 MG tablet  benzonatate 100 MG capsule (TESSALON)  ondansetron 4 MG disintegrating tablet (Zofran ODT)  pravastatin 10 MG tablet (PRAVACHOL)  traMADol 50 MG tablet (ULTRAM)  Change: Humulin Kwikpen  Medication changes verified by the patient?  Y    Medication Accessibility:  Home Pharmacy: [facility] in [place]   Was the patient provided with refills on discharged medications? N   Have all prescriptions been transferred from [facility] to home pharmacy? N/A   Is the patient able to afford medications? Y Notable copays: $0 copay    Medication Review:    APIXABAN (ELIQUIS)  Apixaban 10 mg BID initiated on 05/21/21 . Switched to apixaban 5 mg BID after 7 days on 05/28/21 per pt -Declined medication review (was educated by a hospital pharmacist prior to discharge)  Follow-up Appointments:  PCP Hospital f/u appt confirmed?  Scheduled to see Lawerance Cruel on 11/9 @ 1030.   [facility] f/u appt confirmed?  Scheduled to see Lenice Llamas on 11/21 @ 1130.   If their condition worsens, is the pt aware to call PCP or go to the Emergency Dept.? Y  Final Patient Assessment:   -Pt is doing fine -Pt declined medication review.  -Pt has post discharge appointment.  Garnet Sierras, PharmD

## 2021-06-08 NOTE — Progress Notes (Signed)
Primary Physician:  Bartholome Bill, MD   Patient ID: [name], female    DOB: 11-Aug-1970, 50 y.o.   MRN: [id number]  CC:  Chief Complaint  Patient presents with   congestive heart failure   Follow-up   Subjective:   HPI: [name] is a 50 y.o. female  with Sjogren's syndrome, SLE, chronic systolic heart failure now with improved EF to 45-50% on echo in Nov 2019, morbid obesity, obesity hypoventilation syndrome, uncontrolled type 2 DM, hypertension, h/o CVA in 2014, OSA on CPAP and hepatic adenoma s/p embolization on 08/12/19.  Patient has history of COVID-19 infection requiring hospitalization in 12/2019.  Patient presents for hospital follow-up visit.  She was admitted 05/21/2021 - 05/27/2021 with submassive PE with right heart strain and further reduction of LVEF to 30-35% from 45-50% in 12/2020.  Patient was originally scheduled to follow-up with our office 05/31/2021, unfortunately she was lost to follow-up until now.  Patient reports she is compliant with heart failure medications and tolerating present regimen without issue.  However she does admit that she has not taken her medications at all prior to today's office visit, which likely explains why patient's blood pressure is uncontrolled and heart rate elevated.  She does have chronic dyspnea, however she reports this has been mildly worse since recent hospitalization.  She denies chest pain, palpitations, syncope, near syncope.  She is tolerating anticoagulation without bleeding diathesis.  Patient does report that she has been taking torsemide 20 mg twice daily for the last 1 week due to dyspnea.  Past Medical History:  Diagnosis Date   Acute renal failure (HCC)     Intractable nausea vomiting secondary to diabetic gastroparesis causing dehydration and acute renal failure /notes 04/01/2013   Anginal pain ([place])    Anxiety    Bell's palsy 08/09/2010   Bicornuate uterus    CAP (community acquired pneumonia) 10/2011    Archie Endo 11/08/2011   Chest pain 01/13/2014   CHF (congestive heart failure) ([place])    Diabetic gastroparesis associated with type 2 diabetes mellitus ([place])    this is presumed diagnoses, not confirmed by any studies.    Eczema    Family history of malignant neoplasm of breast    Family history of malignant neoplasm of ovary    GERD (gastroesophageal reflux disease)    High cholesterol    Hypertension    Liver lesion 08/13/2019   Liver mass 2014   biopsied 03/2013 at [facility], not malignant.  is to undergo radiologic ablation of the mass in sept/October 2014.    Lupus ([place])    Migraines    "maybe a couple times/yr" (04/01/2013)   Obesity    Obstructive sleep apnea on CPAP 2011   Oxygen at nights   SJOGREN'S SYNDROME 08/09/2010   SLE (systemic lupus erythematosus) ([place])    Archie Endo 12/01/2010   Stroke ([place]) 2010; 10/2012   "left side is still weak from it, never fully regained full strength; no additions from stroke 10/2012"   Past Surgical History:  Procedure Laterality Date   BREAST CYST EXCISION Left 08/2005   epidermoid   CARDIAC CATHETERIZATION  02/07/12   ECTOPIC PREGNANCY SURGERY  1999   ECTOPIC PREGNANCY SURGERY  1999   ESOPHAGOGASTRODUODENOSCOPY N/A 02/26/2013   Procedure: ESOPHAGOGASTRODUODENOSCOPY (EGD);  Surgeon: Irene Shipper, MD;  Location: [facility] ENDOSCOPY;  Service: Endoscopy;  Laterality: N/A;   ESOPHAGOGASTRODUODENOSCOPY N/A 03/02/2015   Procedure: ESOPHAGOGASTRODUODENOSCOPY (EGD);  Surgeon: Milus Banister, MD;  Location: [facility]  ENDOSCOPY;  Service: Endoscopy;  Laterality: N/A;   ESOPHAGOGASTRODUODENOSCOPY N/A 02/23/2021   Procedure: ESOPHAGOGASTRODUODENOSCOPY (EGD);  Surgeon: Wilford Corner, MD;  Location: [place];  Service: Endoscopy;  Laterality: N/A;  possible dilation   HERNIA REPAIR     LEFT AND RIGHT HEART CATHETERIZATION WITH CORONARY ANGIOGRAM N/A 02/07/2012   Procedure: LEFT AND RIGHT HEART CATHETERIZATION WITH CORONARY ANGIOGRAM;  Surgeon: Laverda Page, MD;   Location: [facility] CATH LAB;  Service: Cardiovascular;  Laterality: N/A;   LIVER BIOPSY  03/2013   liver mass/medical hx noted above   MUSCLE BIOPSY     for lupus/notes 02/06/7281   UMBILICAL HERNIA REPAIR  1980's   Family History  Problem Relation Age of Onset   Diabetes Mother    Asthma Mother    Urticaria Mother    Hypertension Mother    Breast cancer Maternal Aunt 36       currently 66   Stomach cancer Maternal Uncle        dx 60s; deceased   Prostate cancer Maternal Uncle        deceased 24s   Cancer Maternal Aunt        unk. primary   Cancer Maternal Aunt        unk. primary   Ovarian cancer Maternal Aunt        deceased 63s   Hypertension Other    Stroke Other    Arthritis Other    Ovarian cancer Sister 71       maternal half-sister; RAD51C positive   Cancer Paternal Aunt        unk. primary; deceased 62s   Prostate cancer Paternal Uncle        deceased 88s   Breast cancer Cousin        deceased 67s; daughter of mat uncle with stomach ca   Breast cancer Maternal Aunt        deceased 35s   Asthma Brother    Asthma Brother    Allergic rhinitis Neg Hx    Angioedema Neg Hx    Social History   Tobacco Use   Smoking status: Former    Packs/day: 0.50    Years: 3.00    Pack years: 1.50    Types: Cigarettes    Quit date: 06/02/1991    Years since quitting: 30.0   Smokeless tobacco: Never   Tobacco comments:    quit 28 yrs ago  Substance Use Topics   Alcohol use: No    Alcohol/week: 0.0 standard drinks   Marital Status: Single   ROS   Review of Systems  Cardiovascular:  Positive for dyspnea on exertion. Negative for chest pain, leg swelling, orthopnea, palpitations and paroxysmal nocturnal dyspnea.  Musculoskeletal:  Positive for arthritis and back pain.  Gastrointestinal:  Negative for melena.   Objective:  Blood pressure (!) 165/100, pulse 100, height _0  (1.727 m), weight (!) 337 lb (152.9 kg), last menstrual period 03/24/2017, SpO2 99 %. Body mass index  is 51.24 kg/m.   Vitals with BMI 06/09/2021 06/02/2021 05/27/2021  Height _1  _2  -  Weight 337 lbs 333 lbs 6 oz -  BMI 06.01 56.1 -  Systolic 537 943 276  Diastolic 147 80 62  Pulse 100 105 85   Physical Exam Vitals reviewed.  Constitutional:      Appearance: She is well-developed.     Comments: Morbidly obese  Neck:     Vascular: No JVD.     Comments: Short neck and  difficult to evaluate JVP Cardiovascular:     Rate and Rhythm: Regular rhythm. Tachycardia present.     Pulses: Intact distal pulses.          Carotid pulses are 2+ on the right side and 2+ on the left side.      Dorsalis pedis pulses are 2+ on the right side and 2+ on the left side.       Posterior tibial pulses are 2+ on the right side and 2+ on the left side.     Heart sounds: Normal heart sounds. No murmur heard.   No gallop.     Comments: Femoral and popliteal pulse difficult to feel due to patient's body habitus.  Pulmonary:     Effort: Pulmonary effort is normal.     Breath sounds: Normal breath sounds.  Abdominal:     Comments: Obese. Pannus present  Musculoskeletal:     Right lower leg: No edema.     Left lower leg: No edema.   Laboratory examination:   CMP Latest Ref Rng & Units 05/23/2021 05/22/2021 05/21/2021  Glucose 70 - 99 mg/dL 235(H) 250(H) 293(H)  BUN 6 - 20 mg/dL _0 Creatinine 0.44 - 1.00 mg/dL 0.90 0.95 1.12(H)  Sodium 135 - 145 mmol/L 136 139 138  Potassium 3.5 - 5.1 mmol/L 3.6 3.6 4.0  Chloride 98 - 111 mmol/L 104 108 107  CO2 22 - 32 mmol/L _1 Calcium 8.9 - 10.3 mg/dL 8.6(L) 8.7(L) 9.5  Total Protein 6.5 - 8.1 g/dL - 5.9(L) 6.4(L)  Total Bilirubin 0.3 - 1.2 mg/dL - 0.4 0.6  Alkaline Phos 38 - 126 U/L - 119 138(H)  AST 15 - 41 U/L - 17 75(H)  ALT 0 - 44 U/L - 14 24   CBC Latest Ref Rng & Units 05/26/2021 05/25/2021 05/24/2021  WBC 4.0 - 10.5 K/uL 9.9 10.0 10.9(H)  Hemoglobin 12.0 - 15.0 g/dL 11.5(L) 11.3(L) 11.2(L)  Hematocrit 36.0 - 46.0 % 36.2 36.4 34.8(L)   Platelets 150 - 400 K/uL 232 188 201   Lipid Panel     Component Value Date/Time   CHOL 203 (H) 05/22/2021 1432   TRIG 138 05/22/2021 1432   HDL 58 05/22/2021 1432   CHOLHDL 3.5 05/22/2021 1432   VLDL 28 05/22/2021 1432   LDLCALC 117 (H) 05/22/2021 1432     HEMOGLOBIN A1C Lab Results  Component Value Date   HGBA1C 12.2 (A) 06/02/2021   MPG 323.53 05/22/2021   TSH Recent Labs    05/21/21 0208  TSH 2.871     BNP (last 3 results) Recent Labs    05/21/21 0208  BNP 17.9    ProBNP (last 3 results) No results for input(s): PROBNP in the last 8760 hours.   Allergies   Allergies  Allergen Reactions   Metoclopramide Other (See Comments)    Developed restless leg, akathisia type limb movements.    Codeine Itching, Rash and Other (See Comments)   Hydrocodone Rash   Penicillins Itching    Tolerated Unasyn 02/01/2020   Percocet [Oxycodone-Acetaminophen] Hives and Rash    Tolerates dilaudid Tolerates acetaminophen     Medications Prior to Visit:   Outpatient Medications Prior to Visit  Medication Sig Dispense Refill   ACCU-CHEK AVIVA PLUS test strip USE TO TEST BLOOD SUGAR 4 TIMES DAILY 100 strip 2   ADVAIR HFA 230-21 MCG/ACT inhaler Inhale 1-2 puffs into the lungs 2 (two) times daily.     albuterol (PROVENTIL) (2.5 MG/3ML)  0.083% nebulizer solution Take 2.5 mg by nebulization every 6 (six) hours as needed for wheezing.     ALPRAZolam (XANAX) 0.5 MG tablet Take 0.5 mg 2 (two) times daily as needed by mouth for anxiety or sleep.      amLODipine (NORVASC) 10 MG tablet Take 1 tablet (10 mg total) by mouth daily. 30 tablet 0   apixaban (ELIQUIS) 5 MG TABS tablet Take 1 tablet (5 mg total) by mouth 2 (two) times daily. 60 tablet 0   ARIPiprazole (ABILIFY) 5 MG tablet Take 5 mg by mouth daily.     Azathioprine 75 MG TABS Take 150 mg by mouth 2 (two) times daily.     B-D UF III MINI PEN NEEDLES 31G X 5 MM MISC USE AS DIRECTED TWICE A DAY 100 each 2   Belimumab (BENLYSTA) 200  MG/ML SOAJ Inject 200 mg into the skin once a week. Thursdays     Blood Glucose Monitoring Suppl (ONETOUCH VERIO) w/Device KIT Test 4 times daily.  DX E11.9 1 kit 1   carvedilol (COREG) 25 MG tablet Take 1.5 tablets (37.5 mg total) by mouth 2 (two) times daily with a meal. 270 tablet 3   cromolyn (OPTICROM) 4 % ophthalmic solution Place 1 drop into both eyes 2 (two) times daily.     cycloSPORINE (RESTASIS) 0.05 % ophthalmic emulsion Place 1 drop into both eyes in the morning, at noon, and at bedtime.      diclofenac Sodium (VOLTAREN) 1 % GEL Apply 2 g topically 4 (four) times daily. 350 g 0   DULoxetine (CYMBALTA) 60 MG capsule Take 120 mg by mouth at bedtime.     EASY TOUCH PEN NEEDLES 32G X 4 MM MISC USE TO INJECT INSULIN TWICE DAILY 100 each 9   ENTRESTO 97-103 MG TAKE 1 TABLET BY MOUTH 2 (TWO) TIMES DAILY. (Patient taking differently: Take 1 tablet by mouth 2 (two) times daily.) 180 tablet 3   famotidine (PEPCID) 20 MG tablet Take 20 mg by mouth 2 (two) times daily.     fluticasone (FLONASE) 50 MCG/ACT nasal spray Place 2 sprays into both nostrils continuous as needed for allergies or rhinitis.     glucose blood (ONETOUCH VERIO) test strip Test 4 times daily DX E11.9 100 each 12   glucose blood test strip Test 4 times daily DX E11.9 100 each 12   hydroxychloroquine (PLAQUENIL) 200 MG tablet Take 200 mg by mouth 2 (two) times daily.     Insulin NPH, Human,, Isophane, (HUMULIN N KWIKPEN) 100 UNIT/ML Kiwkpen Inject 260 Units into the skin every morning. And pen needles 1/day 270 mL 3   Insulin Syringe-Needle U-100 (EASY TOUCH INSULIN SYRINGE) 31G X 5/16" 1 ML MISC USE AS DIRECTED THREE TIMES A DAY 100 each 10   isosorbide-hydrALAZINE (BIDIL) 20-37.5 MG tablet TAKE TWO (2) TABLETS BY MOUTH THREE TIMES DAILY (Patient taking differently: Take 2 tablets by mouth 3 (three) times daily.) 270 tablet 3   Lancets (ONETOUCH ULTRASOFT) lancets Test 4 times daily DX E11.9 100 each 12   levothyroxine  (SYNTHROID) 50 MCG tablet Take 1 tablet (50 mcg total) by mouth daily at 6 (six) AM.     megestrol (MEGACE) 40 MG tablet Take 40 mg by mouth daily.  3   metFORMIN (GLUCOPHAGE-XR) 500 MG 24 hr tablet Take 2 tablets (1,000 mg total) by mouth 2 (two) times daily. 180 tablet 3   Multiple Vitamin (MULTIVITAMIN WITH MINERALS) TABS tablet Take 1 tablet by mouth daily.  oxybutynin (DITROPAN XL) 15 MG 24 hr tablet Take 15 mg by mouth daily.      predniSONE (DELTASONE) 1 MG tablet Take 2 mg by mouth daily.     pregabalin (LYRICA) 75 MG capsule Take 75 mg 3 (three) times daily by mouth.      QUEtiapine (SEROQUEL) 300 MG tablet Take 300 mg by mouth at bedtime.     scopolamine (TRANSDERM-SCOP) 1 MG/3DAYS Place 1 patch (1.5 mg total) onto the skin every 3 (three) days as needed (nausea). Do not wear for more than 72 hours. 10 patch 0   spironolactone (ALDACTONE) 50 MG tablet Take 1 tablet (50 mg total) by mouth daily. 90 tablet 3   sucralfate (CARAFATE) 1 GM/10ML suspension Take 10 mLs by mouth 2 (two) times daily.     topiramate (TOPAMAX) 25 MG tablet Take 75 mg by mouth at bedtime.     trimethobenzamide (TIGAN) 300 MG capsule Take 1 capsule (300 mg total) by mouth 3 (three) times daily as needed for nausea/vomiting. Do not take with the Scopolamine unless nausea is not improving 10 capsule 0   VICTOZA 18 MG/3ML SOPN INJECT 1.8 MG UNDER THE SKIN ONCE DAILY IN THE MORNING (Patient taking differently: Inject 1.8 mg into the skin daily.) 6 mL 1   rosuvastatin (CRESTOR) 20 MG tablet Take 1 tablet (20 mg total) by mouth daily. 30 tablet 0   torsemide (DEMADEX) 20 MG tablet Take 20 mg by mouth at bedtime.     No facility-administered medications prior to visit.   Final Medications at End of Visit    Current Meds  Medication Sig   ACCU-CHEK AVIVA PLUS test strip USE TO TEST BLOOD SUGAR 4 TIMES DAILY   ADVAIR HFA 230-21 MCG/ACT inhaler Inhale 1-2 puffs into the lungs 2 (two) times daily.   albuterol  (PROVENTIL) (2.5 MG/3ML) 0.083% nebulizer solution Take 2.5 mg by nebulization every 6 (six) hours as needed for wheezing.   ALPRAZolam (XANAX) 0.5 MG tablet Take 0.5 mg 2 (two) times daily as needed by mouth for anxiety or sleep.    amLODipine (NORVASC) 10 MG tablet Take 1 tablet (10 mg total) by mouth daily.   apixaban (ELIQUIS) 5 MG TABS tablet Take 1 tablet (5 mg total) by mouth 2 (two) times daily.   ARIPiprazole (ABILIFY) 5 MG tablet Take 5 mg by mouth daily.   Azathioprine 75 MG TABS Take 150 mg by mouth 2 (two) times daily.   B-D UF III MINI PEN NEEDLES 31G X 5 MM MISC USE AS DIRECTED TWICE A DAY   Belimumab (BENLYSTA) 200 MG/ML SOAJ Inject 200 mg into the skin once a week. Thursdays   Blood Glucose Monitoring Suppl (ONETOUCH VERIO) w/Device KIT Test 4 times daily.  DX E11.9   carvedilol (COREG) 25 MG tablet Take 1.5 tablets (37.5 mg total) by mouth 2 (two) times daily with a meal.   cromolyn (OPTICROM) 4 % ophthalmic solution Place 1 drop into both eyes 2 (two) times daily.   cycloSPORINE (RESTASIS) 0.05 % ophthalmic emulsion Place 1 drop into both eyes in the morning, at noon, and at bedtime.    diclofenac Sodium (VOLTAREN) 1 % GEL Apply 2 g topically 4 (four) times daily.   DULoxetine (CYMBALTA) 60 MG capsule Take 120 mg by mouth at bedtime.   EASY TOUCH PEN NEEDLES 32G X 4 MM MISC USE TO INJECT INSULIN TWICE DAILY   ENTRESTO 97-103 MG TAKE 1 TABLET BY MOUTH 2 (TWO) TIMES DAILY. (Patient taking  differently: Take 1 tablet by mouth 2 (two) times daily.)   famotidine (PEPCID) 20 MG tablet Take 20 mg by mouth 2 (two) times daily.   fluticasone (FLONASE) 50 MCG/ACT nasal spray Place 2 sprays into both nostrils continuous as needed for allergies or rhinitis.   glucose blood (ONETOUCH VERIO) test strip Test 4 times daily DX E11.9   glucose blood test strip Test 4 times daily DX E11.9   hydroxychloroquine (PLAQUENIL) 200 MG tablet Take 200 mg by mouth 2 (two) times daily.   Insulin NPH,  Human,, Isophane, (HUMULIN N KWIKPEN) 100 UNIT/ML Kiwkpen Inject 260 Units into the skin every morning. And pen needles 1/day   Insulin Syringe-Needle U-100 (EASY TOUCH INSULIN SYRINGE) 31G X 5/16" 1 ML MISC USE AS DIRECTED THREE TIMES A DAY   isosorbide-hydrALAZINE (BIDIL) 20-37.5 MG tablet TAKE TWO (2) TABLETS BY MOUTH THREE TIMES DAILY (Patient taking differently: Take 2 tablets by mouth 3 (three) times daily.)   Lancets (ONETOUCH ULTRASOFT) lancets Test 4 times daily DX E11.9   levothyroxine (SYNTHROID) 50 MCG tablet Take 1 tablet (50 mcg total) by mouth daily at 6 (six) AM.   megestrol (MEGACE) 40 MG tablet Take 40 mg by mouth daily.   metFORMIN (GLUCOPHAGE-XR) 500 MG 24 hr tablet Take 2 tablets (1,000 mg total) by mouth 2 (two) times daily.   Multiple Vitamin (MULTIVITAMIN WITH MINERALS) TABS tablet Take 1 tablet by mouth daily.   oxybutynin (DITROPAN XL) 15 MG 24 hr tablet Take 15 mg by mouth daily.    predniSONE (DELTASONE) 1 MG tablet Take 2 mg by mouth daily.   pregabalin (LYRICA) 75 MG capsule Take 75 mg 3 (three) times daily by mouth.    QUEtiapine (SEROQUEL) 300 MG tablet Take 300 mg by mouth at bedtime.   scopolamine (TRANSDERM-SCOP) 1 MG/3DAYS Place 1 patch (1.5 mg total) onto the skin every 3 (three) days as needed (nausea). Do not wear for more than 72 hours.   spironolactone (ALDACTONE) 50 MG tablet Take 1 tablet (50 mg total) by mouth daily.   sucralfate (CARAFATE) 1 GM/10ML suspension Take 10 mLs by mouth 2 (two) times daily.   topiramate (TOPAMAX) 25 MG tablet Take 75 mg by mouth at bedtime.   trimethobenzamide (TIGAN) 300 MG capsule Take 1 capsule (300 mg total) by mouth 3 (three) times daily as needed for nausea/vomiting. Do not take with the Scopolamine unless nausea is not improving   VICTOZA 18 MG/3ML SOPN INJECT 1.8 MG UNDER THE SKIN ONCE DAILY IN THE MORNING (Patient taking differently: Inject 1.8 mg into the skin daily.)   [DISCONTINUED] rosuvastatin (CRESTOR) 20 MG  tablet Take 1 tablet (20 mg total) by mouth daily.   [DISCONTINUED] torsemide (DEMADEX) 20 MG tablet Take 20 mg by mouth at bedtime.   Radiology:   CXR 02/01/2020: New patchy bilateral airspace disease most consistent with pneumonia.  Chronic cardiomegaly and vascular congestion.  CT angio PE 05/21/2021: 1. Bilateral occlusive acute pulmonary emboli within the proximal segmental pulmonary arteries. Overall clot burden is severe. Evidence of RIGHT ventricular strain (RV/LV Ratio = 1.15) consistent with at least submassive (intermediate risk) PE.  2. No pulmonary infarction.  Cardiac Studies:   Coronary angiogram 02/07/2012: Normal coronary arteries and LVEF. Left dominant circulation.  Carotid duplex 03/06/2014: The vertebral arteries appear patent with antegrade flow. Findings consistent with 1-39 percent stenosis involving the right internal carotid artery and the left internal carotid artery.  Vascular Ultrasound Lower Extremity Venous 03/26/2018: Right: There is no evidence  of deep vein thrombosis in the lower extremity. No cystic structure found in the popliteal fossa. Left: No evidence of common femoral vein obstruction.  PCV ECHOCARDIOGRAM COMPLETE 01/20/2021 Mildly depressed LV systolic function with visual EF 45-50%. Left ventricle cavity is mildly dilated. Moderate left ventricular hypertrophy. Hypokinetic global wall motion.  Doppler evidence of grade I (impaired) diastolic dysfunction, normal LAP. Left atrial cavity is normal in size. Mild (Grade I) mitral regurgitation. Mild tricuspid regurgitation. No evidence of pulmonary hypertension. Insignificant pericardial effusion. Compared to study dated 02/02/2020: G1DD is new, Mild MR and TR are new, otherwise no significant change.  ECHO COMPLETE WITH IMAGING ENHANCING AGENT 05/21/2021 1. Left ventricular ejection fraction, by estimation, is 30 to 35%. The left ventricle has moderately decreased function. The left ventricle  demonstrates global hypokinesis. There is mild left ventricular hypertrophy. Left ventricular diastolic parameters are consistent with Grade I diastolic dysfunction (impaired relaxation). There is the interventricular septum is flattened in systole and diastole, consistent with right ventricular pressure and volume overload. 2. Right ventricular systolic function is severely reduced. The right ventricular size is normal. 3. The mitral valve is normal in structure. Trivial mitral valve regurgitation. No evidence of mitral stenosis. 4. The aortic valve is tricuspid. Aortic valve regurgitation is not visualized. No aortic stenosis is present.  DVT study 05/22/2021: BILATERAL: No evidence of deep vein thrombosis seen in the lower extremities,  bilaterally. No evidence of popliteal cyst, bilaterally.  EKG  06/09/2021: Sinus rhythm at a rate of 97 bpm.  Normal axis.  Poor R wave progression, cannot exclude anteroseptal infarct old.  Diffuse nonspecific T wave abnormality.  Low voltage complexes, consider pulmonary disease pattern.  01/21/2020: Normal sinus rhythm with rate of 100 bpm, left axis deviation, poor R wave progression, cannot exclude anterolateral infarct old.  Low-voltage chest leads, consider pulmonary disease pattern.  Nonspecific T abnormality.  Assessment:     ICD-10-CM   1. Chronic combined systolic and diastolic heart failure (HCC)  I50.42 EKG 12-Lead    PCV ECHOCARDIOGRAM COMPLETE    Basic metabolic panel    Pro b natriuretic peptide (BNP)[id number])    2. Essential hypertension  I10     3. Mild hyperlipidemia  E78.5 Lipid Panel With LDL/HDL Ratio      Meds ordered this encounter  Medications   rosuvastatin (CRESTOR) 20 MG tablet    Sig: Take 1 tablet (20 mg total) by mouth daily.    Dispense:  90 tablet    Refill:  3   torsemide (DEMADEX) 20 MG tablet    Sig: Take 1 tablet (20 mg total) by mouth at bedtime.    Dispense:  90 tablet    Refill:  3    Medications Discontinued During This Encounter  Medication Reason   rosuvastatin (CRESTOR) 20 MG tablet Reorder   torsemide (DEMADEX) 20 MG tablet Reorder   Recommendations:   [name]  is a 50 y.o. female  with Sjogren's syndrome, SLE, chronic systolic heart failure now with improved EF to 45-50% on echo in Nov 2019, morbid obesity, obesity hypoventilation syndrome, uncontrolled type 2 DM, hypertension, h/o CVA in 2014, OSA on CPAP and hepatic  adenoma s/p embolization on 08/12/19.  Patient presents for hospital follow-up visit.  She was admitted 05/21/2021 - 05/27/2021 with submassive PE with right heart strain and further reduction of LVEF to 30-35% from 45-50% in 12/2020.  Patient was originally scheduled to follow-up with our office 05/31/2021, unfortunately she was lost to follow-up  until now. Patient's chronic dyspnea has been worse since discahrge form the hosptial, therefore she has been taking torsemide 20 mg twice daily.  She is presently euvolemic on exam.  Advised patient to continue torsemide twice daily and will obtain BMP and BNP.  Patient's blood pressure is uncontrolled in the office today, likely due to the fact that she has not taken any of her medications yet at this time today.  Will not make changes to medications.  She is currently on guideline directed medical therapy for heart failure, will continue carvedilol, Entresto, BiDil, and spironolactone, as well as torsemide.  Patient is now on Rosuvastatin recently changed from pravastatin.  We will repeat lipid profile testing in 3 months.  Reviewed and discussed with patient results of echocardiogram during recent hospitalization.  Patient is now on Eliquis, of which I will defer management to PCP and pulmonology.  We will plan to repeat echocardiogram in 3 months to reevaluate RV function as well as LVEF.  Will not make changes to patient's medications at this time.  However given elevated blood pressure Will scheduled  for 2-week virtual visit for follow-up of hypertension.  We will then follow-up in 3 months for echocardiogram results and heart failure.   This was a 45-minute encounter with face-to-face counseling, medical records review, coordination of care, explanation of complex medical issues, complex medical decision making.     Alethia Berthold, PA-C 06/09/2021, 12:22 PM Office: [phone]

## 2021-06-09 ENCOUNTER — Ambulatory Visit: Payer: Medicaid Other | Admitting: Student

## 2021-06-09 ENCOUNTER — Other Ambulatory Visit: Payer: Self-pay

## 2021-06-09 ENCOUNTER — Encounter: Payer: Self-pay | Admitting: Student

## 2021-06-09 ENCOUNTER — Other Ambulatory Visit (HOSPITAL_COMMUNITY): Payer: Self-pay

## 2021-06-09 VITALS — BP 165/100 | HR 100 | Ht 68.0 in | Wt 337.0 lb

## 2021-06-09 DIAGNOSIS — I5042 Chronic combined systolic (congestive) and diastolic (congestive) heart failure: Secondary | ICD-10-CM

## 2021-06-09 DIAGNOSIS — I10 Essential (primary) hypertension: Secondary | ICD-10-CM

## 2021-06-09 DIAGNOSIS — E785 Hyperlipidemia, unspecified: Secondary | ICD-10-CM

## 2021-06-09 MED ORDER — ROSUVASTATIN CALCIUM 20 MG PO TABS: 20.0000 mg | ORAL_TABLET | Freq: Every day | ORAL | 3 refills | Status: DC

## 2021-06-09 MED ORDER — TORSEMIDE 20 MG PO TABS: 20.0000 mg | ORAL_TABLET | Freq: Every day | ORAL | 3 refills | Status: DC

## 2021-06-16 ENCOUNTER — Ambulatory Visit: Payer: Medicaid Other | Admitting: Endocrinology

## 2021-06-17 ENCOUNTER — Other Ambulatory Visit (HOSPITAL_COMMUNITY): Payer: Self-pay

## 2021-06-18 ENCOUNTER — Other Ambulatory Visit: Payer: Self-pay

## 2021-06-18 ENCOUNTER — Ambulatory Visit (INDEPENDENT_AMBULATORY_CARE_PROVIDER_SITE_OTHER): Payer: Medicaid Other | Admitting: Endocrinology

## 2021-06-18 VITALS — BP 148/70 | HR 98 | Ht 68.0 in | Wt 334.2 lb

## 2021-06-18 DIAGNOSIS — E1169 Type 2 diabetes mellitus with other specified complication: Secondary | ICD-10-CM

## 2021-06-18 DIAGNOSIS — E785 Hyperlipidemia, unspecified: Secondary | ICD-10-CM

## 2021-06-18 MED ORDER — ACCU-CHEK GUIDE VI STRP: 1.0000 | ORAL_STRIP | Freq: Two times a day (BID) | 3 refills | Status: DC

## 2021-06-18 MED ORDER — ACCU-CHEK GUIDE W/DEVICE KIT: 1.0000 | PACK | Freq: Once | 0 refills | Status: AC

## 2021-06-18 NOTE — Patient Instructions (Addendum)
check your blood sugar 4 times a day: before the 3 meals, and at bedtime.  also check if you have symptoms of your blood sugar being too high or too low.  please keep a record of the readings and bring it to your next appointment here (or you can bring the meter itself).  You can write it on any piece of paper.  please call us sooner if your blood sugar goes below 70, or if you have a lot of readings over 200, especially if you have to increase the prednisone again.   Blood tests are requested for you today.  We'll let you know about the results.  If this is also high, we'll ask medicaid to approve a stronger alternative to Victoza.    On this type of insulin schedule, you should eat meals on a regular schedule (especially lunch).  If a meal is missed or significantly delayed, your blood sugar could go low.  I have sent a prescription to your pharmacy, for a new meter and strips.   Please come back for a follow-up appointment in 2 months.

## 2021-06-18 NOTE — Progress Notes (Signed)
Subjective:    Patient ID: [name], female    DOB: 25-Jun-1971, 50 y.o.   MRN: [id number]  HPI Pt returns for f/u of diabetes mellitus: DM type: Insulin-requiring type 2 Dx'ed: 8101 Complications: polyneuropathy and gastroparesis.  Therapy: insulin (since 2014), Victoza, and metformin.   GDM: never.  DKA: never.  Severe hypoglycemia: never.  Pancreatitis: never.  Other: she is too ill to undergo weight-loss surgery; she takes qd insulin, after poor results with multiple daily injections; she takes NPH, due to pattern of cbg's.   Interval history: I reviewed continuous glucose monitor data.  Glucose varies from 65-330.  There is no trend throughout the day, but glucose varies widely at all times of day. Pt says she takes insulin as rx'ed. Past Medical History:  Diagnosis Date   Acute renal failure (HCC)     Intractable nausea vomiting secondary to diabetic gastroparesis causing dehydration and acute renal failure /notes 04/01/2013   Anginal pain ([place])    Anxiety    Bell's palsy 08/09/2010   Bicornuate uterus    CAP (community acquired pneumonia) 10/2011   Archie Endo 11/08/2011   Chest pain 01/13/2014   CHF (congestive heart failure) ([place])    Diabetic gastroparesis associated with type 2 diabetes mellitus ([place])    this is presumed diagnoses, not confirmed by any studies.    Eczema    Family history of malignant neoplasm of breast    Family history of malignant neoplasm of ovary    GERD (gastroesophageal reflux disease)    High cholesterol    Hypertension    Liver lesion 08/13/2019   Liver mass 2014   biopsied 03/2013 at [facility], not malignant.  is to undergo radiologic ablation of the mass in sept/October 2014.    Lupus ([place])    Migraines    "maybe a couple times/yr" (04/01/2013)   Obesity    Obstructive sleep apnea on CPAP 2011   Oxygen at nights   SJOGREN'S SYNDROME 08/09/2010   SLE (systemic lupus erythematosus) ([place])    Archie Endo 12/01/2010   Stroke ([place]) 2011;  10/2012   "left side is still weak from it, never fully regained full strength; no additions from stroke 10/2012"    Past Surgical History:  Procedure Laterality Date   BREAST CYST EXCISION Left 08/2005   epidermoid   CARDIAC CATHETERIZATION  02/07/2012   ECTOPIC PREGNANCY SURGERY  1999   ESOPHAGOGASTRODUODENOSCOPY N/A 02/26/2013   Procedure: ESOPHAGOGASTRODUODENOSCOPY (EGD);  Surgeon: Irene Shipper, MD;  Location: [facility] ENDOSCOPY;  Service: Endoscopy;  Laterality: N/A;   ESOPHAGOGASTRODUODENOSCOPY N/A 03/02/2015   Procedure: ESOPHAGOGASTRODUODENOSCOPY (EGD);  Surgeon: Milus Banister, MD;  Location: [facility];  Service: Endoscopy;  Laterality: N/A;   ESOPHAGOGASTRODUODENOSCOPY N/A 02/23/2021   Procedure: ESOPHAGOGASTRODUODENOSCOPY (EGD);  Surgeon: Wilford Corner, MD;  Location: [facility];  Service: Endoscopy;  Laterality: N/A;  possible dilation   HERNIA REPAIR     LEFT AND RIGHT HEART CATHETERIZATION WITH CORONARY ANGIOGRAM N/A 02/07/2012   Procedure: LEFT AND RIGHT HEART CATHETERIZATION WITH CORONARY ANGIOGRAM;  Surgeon: Laverda Page, MD;  Location: [facility] CATH LAB;  Service: Cardiovascular;  Laterality: N/A;   LIVER BIOPSY  03/2013   liver mass/medical hx noted above   MUSCLE BIOPSY     for lupus/notes 02/03/1024   UMBILICAL HERNIA REPAIR  1980's    Social History   Socioeconomic History   Marital status: Single    Spouse name: Not on file   Number of children: 1   Years  of education: Not on file   Highest education level: Not on file  Occupational History    Employer: UNEMPLOYED    Comment: student  Tobacco Use   Smoking status: Former    Packs/day: 0.50    Years: 3.00    Pack years: 1.50    Types: Cigarettes    Quit date: 06/02/1991    Years since quitting: 30.0   Smokeless tobacco: Never   Tobacco comments:    quit 28 yrs ago  Vaping Use   Vaping Use: Never used  Substance and Sexual Activity   Alcohol use: No    Alcohol/week: 0.0 standard drinks   Drug  use: No   Sexual activity: Yes    Partners: Male    Birth control/protection: None  Other Topics Concern   Not on file  Social History Narrative   Regular exercise-yes   Social Determinants of Health   Financial Resource Strain: Not on file  Food Insecurity: Not on file  Transportation Needs: Not on file  Physical Activity: Not on file  Stress: Not on file  Social Connections: Not on file  Intimate Partner Violence: Not on file    Current Outpatient Medications on File Prior to Visit  Medication Sig Dispense Refill   ACCU-CHEK AVIVA PLUS test strip USE TO TEST BLOOD SUGAR 4 TIMES DAILY 100 strip 2   ADVAIR HFA 230-21 MCG/ACT inhaler Inhale 1-2 puffs into the lungs 2 (two) times daily.     albuterol (PROVENTIL) (2.5 MG/3ML) 0.083% nebulizer solution Take 2.5 mg by nebulization every 6 (six) hours as needed for wheezing.     ALPRAZolam (XANAX) 0.5 MG tablet Take 0.5 mg 2 (two) times daily as needed by mouth for anxiety or sleep.      amLODipine (NORVASC) 10 MG tablet Take 1 tablet (10 mg total) by mouth daily. 30 tablet 0   apixaban (ELIQUIS) 5 MG TABS tablet Take 1 tablet (5 mg total) by mouth 2 (two) times daily. 60 tablet 0   ARIPiprazole (ABILIFY) 5 MG tablet Take 5 mg by mouth daily.     Azathioprine 75 MG TABS Take 150 mg by mouth 2 (two) times daily.     B-D UF III MINI PEN NEEDLES 31G X 5 MM MISC USE AS DIRECTED TWICE A DAY 100 each 2   Belimumab (BENLYSTA) 200 MG/ML SOAJ Inject 200 mg into the skin once a week. Thursdays     Blood Glucose Monitoring Suppl (ONETOUCH VERIO) w/Device KIT Test 4 times daily.  DX E11.9 1 kit 1   carvedilol (COREG) 25 MG tablet Take 1.5 tablets (37.5 mg total) by mouth 2 (two) times daily with a meal. 270 tablet 3   cromolyn (OPTICROM) 4 % ophthalmic solution Place 1 drop into both eyes 2 (two) times daily.     cycloSPORINE (RESTASIS) 0.05 % ophthalmic emulsion Place 1 drop into both eyes in the morning, at noon, and at bedtime.      diclofenac  Sodium (VOLTAREN) 1 % GEL Apply 2 g topically 4 (four) times daily. (Patient not taking: Reported on 06/22/2021) 350 g 0   DULoxetine (CYMBALTA) 60 MG capsule Take 120 mg by mouth at bedtime.     EASY TOUCH PEN NEEDLES 32G X 4 MM MISC USE TO INJECT INSULIN TWICE DAILY 100 each 9   ENTRESTO 97-103 MG TAKE 1 TABLET BY MOUTH 2 (TWO) TIMES DAILY. (Patient taking differently: Take 1 tablet by mouth 2 (two) times daily.) 180 tablet 3   famotidine (  PEPCID) 20 MG tablet Take 20 mg by mouth 2 (two) times daily.     fluticasone (FLONASE) 50 MCG/ACT nasal spray Place 2 sprays into both nostrils continuous as needed for allergies or rhinitis.     hydroxychloroquine (PLAQUENIL) 200 MG tablet Take 200 mg by mouth 2 (two) times daily.     Insulin NPH, Human,, Isophane, (HUMULIN N KWIKPEN) 100 UNIT/ML Kiwkpen Inject 260 Units into the skin every morning. And pen needles 1/day 270 mL 3   Insulin Syringe-Needle U-100 (EASY TOUCH INSULIN SYRINGE) 31G X 5/16" 1 ML MISC USE AS DIRECTED THREE TIMES A DAY 100 each 10   isosorbide-hydrALAZINE (BIDIL) 20-37.5 MG tablet TAKE TWO (2) TABLETS BY MOUTH THREE TIMES DAILY (Patient taking differently: Take 2 tablets by mouth 3 (three) times daily.) 270 tablet 3   Lancets (ONETOUCH ULTRASOFT) lancets Test 4 times daily DX E11.9 100 each 12   levothyroxine (SYNTHROID) 50 MCG tablet Take 1 tablet (50 mcg total) by mouth daily at 6 (six) AM.     megestrol (MEGACE) 40 MG tablet Take 40 mg by mouth daily.  3   metFORMIN (GLUCOPHAGE-XR) 500 MG 24 hr tablet Take 2 tablets (1,000 mg total) by mouth 2 (two) times daily. 180 tablet 3   Multiple Vitamin (MULTIVITAMIN WITH MINERALS) TABS tablet Take 1 tablet by mouth daily.     oxybutynin (DITROPAN XL) 15 MG 24 hr tablet Take 15 mg by mouth daily.      predniSONE (DELTASONE) 1 MG tablet Take 2 mg by mouth daily.     pregabalin (LYRICA) 75 MG capsule Take 75 mg 3 (three) times daily by mouth.      QUEtiapine (SEROQUEL) 300 MG tablet Take 300  mg by mouth at bedtime.     rosuvastatin (CRESTOR) 20 MG tablet Take 1 tablet (20 mg total) by mouth daily. 90 tablet 3   scopolamine (TRANSDERM-SCOP) 1 MG/3DAYS Place 1 patch (1.5 mg total) onto the skin every 3 (three) days as needed (nausea). Do not wear for more than 72 hours. 10 patch 0   spironolactone (ALDACTONE) 50 MG tablet Take 1 tablet (50 mg total) by mouth daily. 90 tablet 3   sucralfate (CARAFATE) 1 GM/10ML suspension Take 10 mLs by mouth 2 (two) times daily.     topiramate (TOPAMAX) 25 MG tablet Take 75 mg by mouth at bedtime.     torsemide (DEMADEX) 20 MG tablet Take 1 tablet (20 mg total) by mouth at bedtime. 90 tablet 3   trimethobenzamide (TIGAN) 300 MG capsule Take 1 capsule (300 mg total) by mouth 3 (three) times daily as needed for nausea/vomiting. Do not take with the Scopolamine unless nausea is not improving 10 capsule 0   No current facility-administered medications on file prior to visit.    Allergies  Allergen Reactions   Metoclopramide Other (See Comments)    Developed restless leg, akathisia type limb movements.    Codeine Itching, Rash and Other (See Comments)   Hydrocodone Rash   Penicillins Itching    Tolerated Unasyn 02/01/2020   Percocet [Oxycodone-Acetaminophen] Hives and Rash    Tolerates dilaudid Tolerates acetaminophen     Family History  Problem Relation Age of Onset   Diabetes Mother    Asthma Mother    Urticaria Mother    Hypertension Mother    Stroke Mother    Ovarian cancer Sister 30       maternal half-sister; RAD51C positive   Asthma Brother    Asthma Brother  Breast cancer Maternal Aunt 60       currently 71   Cancer Maternal Aunt        unk. primary   Cancer Maternal Aunt        unk. primary   Ovarian cancer Maternal Aunt        deceased 56s   Breast cancer Maternal Aunt        deceased 30s   Stomach cancer Maternal Uncle        dx 30s; deceased   Prostate cancer Maternal Uncle        deceased 43s   Cancer Paternal Aunt         unk. primary; deceased 62s   Prostate cancer Paternal Uncle        deceased 98s   Breast cancer Cousin        deceased 64s; daughter of mat uncle with stomach ca   Hypertension Other    Stroke Other    Arthritis Other    Allergic rhinitis Neg Hx    Angioedema Neg Hx     BP (!) 148/70 (BP Location: Right Arm, Patient Position: Sitting, Cuff Size: Large)   Pulse 98   Ht 5' 8"  (1.727 m)   Wt (!) 334 lb 3.2 oz (151.6 kg)   LMP 03/24/2017   SpO2 99%   BMI 50.81 kg/m   Review of Systems     Objective:   Physical Exam  Lab Results  Component Value Date   CREATININE 0.90 05/23/2021   BUN 13 05/23/2021   NA 136 05/23/2021   K 3.6 05/23/2021   CL 104 05/23/2021   CO2 24 05/23/2021    Lab Results  Component Value Date   HGBA1C 12.2 (A) 06/02/2021   Fructosamine=351    Assessment & Plan:  Insulin-requiring type 2 DM. Hypoglycemia, due to insulin: we should ask medicaid to approve Trulicity

## 2021-06-21 ENCOUNTER — Inpatient Hospital Stay: Payer: Medicaid Other | Admitting: Internal Medicine

## 2021-06-21 NOTE — Progress Notes (Signed)
[place] [phone]   [phone]  INITIAL CONSULT NOTE  Patient Care Team: Bartholome Bill, MD as PCP - General (Family Medicine)  CHIEF COMPLAINTS/PURPOSE OF CONSULTATION:  Submassive bilateral pulmonary embolism with right heart strain   HISTORY OF PRESENTING ILLNESS:  [name] 50 y.o. female with medical history significant for  lupus, Sjogren's syndrome, congestive heart failure, type 2 diabetes, HLD, HTN, obesity, OSA, and stroke.  On review of the previous records, [name] presented on 05/21/2021 to the emergency room due to shortness of breath. CT angiogram revealed  bilateral occlusive acute pulmonary emboli within the proximal segmental pulmonary arteries. Overall clot burden is severe.Evidence of RIGHT ventricular strain (RV/LV Ratio = 1.15) consistent with at least submassive (intermediate risk) PE. Required 5 L of supplemental oxygen that was eventually weaned off to room air. She was on IV heparin for 2 days and then transitioned to Eliquis 5 mg twice daily. Doppler ultrasound of bilateral LE was negative for DVTs. She underwent anticoagulation workup by checking anticardiolipin antibodies and lupus anticoagulant, both were unremarkable.   On exam today, [name] reports that her energy levels are slowly improving. She is able to ambulate on her own without assistance. She can complete her daily activities. She denies any changes to her appetite. She reports that her shortness of breath has improved since leaving the hospital but still has symptoms mainly with exertion. She denies nausea, vomiting or abdominal pain. Her bowel habits are unchanged. She denies easy bruising or signs of bleeding. She denies fevers, chills, night sweats, chest pain or cough. She has no other complaints. Rest of the 10 point ROS is below.   MEDICAL HISTORY:  Past Medical History:  Diagnosis Date   Acute renal failure (HCC)     Intractable nausea  vomiting secondary to diabetic gastroparesis causing dehydration and acute renal failure /notes 04/01/2013   Anginal pain ([place])    Anxiety    Bell's palsy 08/09/2010   Bicornuate uterus    CAP (community acquired pneumonia) 10/2011   [facility] 11/08/2011   Chest pain 01/13/2014   CHF (congestive heart failure) ([place])    Diabetic gastroparesis associated with type 2 diabetes mellitus ([place])    this is presumed diagnoses, not confirmed by any studies.    Eczema    Family history of malignant neoplasm of breast    Family history of malignant neoplasm of ovary    GERD (gastroesophageal reflux disease)    High cholesterol    Hypertension    Liver lesion 08/13/2019   Liver mass 2014   biopsied 03/2013 at [facility], not malignant.  is to undergo radiologic ablation of the mass in sept/October 2014.    Lupus ([place])    Migraines    "maybe a couple times/yr" (04/01/2013)   Obesity    Obstructive sleep apnea on CPAP 2011   Oxygen at nights   SJOGREN'S SYNDROME 08/09/2010   SLE (systemic lupus erythematosus) ([place])    [facility] Endo 12/01/2010   Stroke ([place]) 2011; 10/2012   "left side is still weak from it, never fully regained full strength; no additions from stroke 10/2012"    SURGICAL HISTORY: Past Surgical History:  Procedure Laterality Date   BREAST CYST EXCISION Left 08/2005   epidermoid   CARDIAC CATHETERIZATION  02/07/2012   ECTOPIC PREGNANCY SURGERY  1999   ESOPHAGOGASTRODUODENOSCOPY N/A 02/26/2013   Procedure: ESOPHAGOGASTRODUODENOSCOPY (EGD);  Surgeon:  Shipper, MD;  [facility] END[facility]COPY;  Service: Endoscopy;  Laterality: N/A;  ESOPHAGOGASTRODUODENOSCOPY N/A 03/02/2015   Procedure: ESOPHAGOGASTRODUODENOSCOPY (EGD);  Surgeon: Milus Banister, MD;  Location: [place];  Service: Endoscopy;  Laterality: N/A;   ESOPHAGOGASTRODUODENOSCOPY N/A 02/23/2021   Procedure: ESOPHAGOGASTRODUODENOSCOPY (EGD);  Surgeon: Wilford Corner, MD;  Location: [place];  Service: Endoscopy;   Laterality: N/A;  possible dilation   HERNIA REPAIR     LEFT AND RIGHT HEART CATHETERIZATION WITH CORONARY ANGIOGRAM N/A 02/07/2012   Procedure: LEFT AND RIGHT HEART CATHETERIZATION WITH CORONARY ANGIOGRAM;  Surgeon: Laverda Page, MD;  Location: [facility] CATH LAB;  Service: Cardiovascular;  Laterality: N/A;   LIVER BIOPSY  03/2013   liver mass/medical hx noted above   MUSCLE BIOPSY     for lupus/notes 09/03/7626   UMBILICAL HERNIA REPAIR  1980's    SOCIAL HISTORY: Social History   Socioeconomic History   Marital status: Single    Spouse name: Not on file   Number of children: 1   Years of education: Not on file   Highest education level: Not on file  Occupational History    Employer: UNEMPLOYED    Comment: student  Tobacco Use   Smoking status: Former    Packs/day: 0.50    Years: 3.00    Pack years: 1.50    Types: Cigarettes    Quit date: 06/02/1991    Years since quitting: 30.0   Smokeless tobacco: Never   Tobacco comments:    quit 28 yrs ago  Vaping Use   Vaping Use: Never used  Substance and Sexual Activity   Alcohol use: No    Alcohol/week: 0.0 standard drinks   Drug use: No   Sexual activity: Yes    Partners: Male    Birth control/protection: None  Other Topics Concern   Not on file  Social History Narrative   Regular exercise-yes   Social Determinants of Health   Financial Resource Strain: Not on file  Food Insecurity: Not on file  Transportation Needs: Not on file  Physical Activity: Not on file  Stress: Not on file  Social Connections: Not on file  Intimate Partner Violence: Not on file    FAMILY HISTORY: Family History  Problem Relation Age of Onset   Diabetes Mother    Asthma Mother    Urticaria Mother    Hypertension Mother    Stroke Mother    Ovarian cancer Sister 84       maternal half-sister; RAD51C positive   Asthma Brother    Asthma Brother    Breast cancer Maternal Aunt 9       currently 63   Cancer Maternal Aunt        unk.  primary   Cancer Maternal Aunt        unk. primary   Ovarian cancer Maternal Aunt        deceased 28s   Breast cancer Maternal Aunt        deceased 66s   Stomach cancer Maternal Uncle        dx 56s; deceased   Prostate cancer Maternal Uncle        deceased 46s   Cancer Paternal Aunt        unk. primary; deceased 35s   Prostate cancer Paternal Uncle        deceased 67s   Breast cancer Cousin        deceased 61s; daughter of mat uncle with stomach ca   Hypertension Other    Stroke Other    Arthritis Other    Allergic  rhinitis Neg Hx    Angioedema Neg Hx     ALLERGIES:  is allergic to metoclopramide, codeine, hydrocodone, penicillins, and percocet [oxycodone-acetaminophen].  MEDICATIONS:  Current Outpatient Medications  Medication Sig Dispense Refill   ACCU-CHEK AVIVA PLUS test strip USE TO TEST BLOOD SUGAR 4 TIMES DAILY 100 strip 2   ADVAIR HFA 230-21 MCG/ACT inhaler Inhale 1-2 puffs into the lungs 2 (two) times daily.     albuterol (PROVENTIL) (2.5 MG/3ML) 0.083% nebulizer solution Take 2.5 mg by nebulization every 6 (six) hours as needed for wheezing.     ALPRAZolam (XANAX) 0.5 MG tablet Take 0.5 mg 2 (two) times daily as needed by mouth for anxiety or sleep.      amLODipine (NORVASC) 10 MG tablet Take 1 tablet (10 mg total) by mouth daily. 30 tablet 0   apixaban (ELIQUIS) 5 MG TABS tablet Take 1 tablet (5 mg total) by mouth 2 (two) times daily. 60 tablet 0   ARIPiprazole (ABILIFY) 5 MG tablet Take 5 mg by mouth daily.     Azathioprine 75 MG TABS Take 150 mg by mouth 2 (two) times daily.     B-D UF III MINI PEN NEEDLES 31G X 5 MM MISC USE AS DIRECTED TWICE A DAY 100 each 2   Belimumab (BENLYSTA) 200 MG/ML SOAJ Inject 200 mg into the skin once a week. Thursdays     Blood Glucose Monitoring Suppl (ONETOUCH VERIO) w/Device KIT Test 4 times daily.  DX E11.9 1 kit 1   carvedilol (COREG) 25 MG tablet Take 1.5 tablets (37.5 mg total) by mouth 2 (two) times daily with a meal. 270  tablet 3   cromolyn (OPTICROM) 4 % ophthalmic solution Place 1 drop into both eyes 2 (two) times daily.     cycloSPORINE (RESTASIS) 0.05 % ophthalmic emulsion Place 1 drop into both eyes in the morning, at noon, and at bedtime.      diclofenac Sodium (VOLTAREN) 1 % GEL Apply 2 g topically 4 (four) times daily. (Patient not taking: Reported on 06/22/2021) 350 g 0   DULoxetine (CYMBALTA) 60 MG capsule Take 120 mg by mouth at bedtime.     EASY TOUCH PEN NEEDLES 32G X 4 MM MISC USE TO INJECT INSULIN TWICE DAILY 100 each 9   ENTRESTO 97-103 MG TAKE 1 TABLET BY MOUTH 2 (TWO) TIMES DAILY. (Patient taking differently: Take 1 tablet by mouth 2 (two) times daily.) 180 tablet 3   famotidine (PEPCID) 20 MG tablet Take 20 mg by mouth 2 (two) times daily.     fluticasone (FLONASE) 50 MCG/ACT nasal spray Place 2 sprays into both nostrils continuous as needed for allergies or rhinitis.     glucose blood (ACCU-CHEK GUIDE) test strip 1 each by Other route 2 (two) times daily. And lancets 2/day 200 each 3   hydroxychloroquine (PLAQUENIL) 200 MG tablet Take 200 mg by mouth 2 (two) times daily.     Insulin NPH, Human,, Isophane, (HUMULIN N KWIKPEN) 100 UNIT/ML Kiwkpen Inject 260 Units into the skin every morning. And pen needles 1/day 270 mL 3   Insulin Syringe-Needle U-100 (EASY TOUCH INSULIN SYRINGE) 31G X 5/16" 1 ML MISC USE AS DIRECTED THREE TIMES A DAY 100 each 10   isosorbide-hydrALAZINE (BIDIL) 20-37.5 MG tablet TAKE TWO (2) TABLETS BY MOUTH THREE TIMES DAILY (Patient taking differently: Take 2 tablets by mouth 3 (three) times daily.) 270 tablet 3   Lancets (ONETOUCH ULTRASOFT) lancets Test 4 times daily DX E11.9 100 each 12  levothyroxine (SYNTHROID) 50 MCG tablet Take 1 tablet (50 mcg total) by mouth daily at 6 (six) AM.     megestrol (MEGACE) 40 MG tablet Take 40 mg by mouth daily.  3   metFORMIN (GLUCOPHAGE-XR) 500 MG 24 hr tablet Take 2 tablets (1,000 mg total) by mouth 2 (two) times daily. 180 tablet 3    Multiple Vitamin (MULTIVITAMIN WITH MINERALS) TABS tablet Take 1 tablet by mouth daily.     oxybutynin (DITROPAN XL) 15 MG 24 hr tablet Take 15 mg by mouth daily.      predniSONE (DELTASONE) 1 MG tablet Take 2 mg by mouth daily.     pregabalin (LYRICA) 75 MG capsule Take 75 mg 3 (three) times daily by mouth.      QUEtiapine (SEROQUEL) 300 MG tablet Take 300 mg by mouth at bedtime.     rosuvastatin (CRESTOR) 20 MG tablet Take 1 tablet (20 mg total) by mouth daily. 90 tablet 3   scopolamine (TRANSDERM-SCOP) 1 MG/3DAYS Place 1 patch (1.5 mg total) onto the skin every 3 (three) days as needed (nausea). Do not wear for more than 72 hours. 10 patch 0   spironolactone (ALDACTONE) 50 MG tablet Take 1 tablet (50 mg total) by mouth daily. 90 tablet 3   sucralfate (CARAFATE) 1 GM/10ML suspension Take 10 mLs by mouth 2 (two) times daily.     topiramate (TOPAMAX) 25 MG tablet Take 75 mg by mouth at bedtime.     torsemide (DEMADEX) 20 MG tablet Take 1 tablet (20 mg total) by mouth at bedtime. 90 tablet 3   trimethobenzamide (TIGAN) 300 MG capsule Take 1 capsule (300 mg total) by mouth 3 (three) times daily as needed for nausea/vomiting. Do not take with the Scopolamine unless nausea is not improving 10 capsule 0   VICTOZA 18 MG/3ML SOPN INJECT 1.8 MG UNDER THE SKIN ONCE DAILY IN THE MORNING (Patient taking differently: Inject 1.8 mg into the skin daily.) 6 mL 1   No current facility-administered medications for this visit.    REVIEW OF SYSTEMS:   Constitutional: ( - ) fevers, ( - )  chills , ( - ) night sweats Eyes: ( - ) blurriness of vision, ( - ) double vision, ( - ) watery eyes Ears, nose, mouth, throat, and face: ( - ) mucositis, ( - ) sore throat Respiratory: ( - ) cough, ( +) dyspnea, ( - ) wheezes Cardiovascular: ( - ) palpitation, ( - ) chest discomfort, ( - ) lower extremity swelling Gastrointestinal:  ( - ) nausea, ( - ) heartburn, ( - ) change in bowel habits Skin: ( - ) abnormal skin  rashes Lymphatics: ( - ) new lymphadenopathy, ( - ) easy bruising Neurological: ( - ) numbness, ( - ) tingling, ( - ) new weaknesses Behavioral/Psych: ( - ) mood change, ( - ) new changes  All other systems were reviewed with the patient and are negative.  PHYSICAL EXAMINATION: ECOG PERFORMANCE STATUS: 1 - Symptomatic but completely ambulatory  There were no vitals filed for this visit. There were no vitals filed for this visit.  GENERAL: well appearing female in NAD  SKIN: skin color, texture, turgor are normal, no rashes or significant lesions EYES: conjunctiva are pink and non-injected, sclera clear OROPHARYNX: no exudate, no erythema; lips, buccal mucosa, and tongue normal  NECK: supple, non-tender LYMPH:  no palpable lymphadenopathy in the cervical or supraclavicular lymph nodes.  LUNGS: clear to auscultation and percussion with normal breathing effort HEART:  regular rate & rhythm and no murmurs and no lower extremity edema ABDOMEN: soft, non-tender, non-distended, normal bowel sounds Musculoskeletal: no cyanosis of digits and no clubbing  PSYCH: alert & oriented x 3, fluent speech NEURO: no focal motor/sensory deficits  LABORATORY DATA:  I have reviewed the data as listed CBC Latest Ref Rng & Units 06/22/2021 05/26/2021 05/25/2021  WBC 4.0 - 10.5 K/uL 9.2 9.9 10.0  Hemoglobin 12.0 - 15.0 g/dL 10.0(L) 11.5(L) 11.3(L)  Hematocrit 36.0 - 46.0 % 31.8(L) 36.2 36.4  Platelets 150 - 400 K/uL 331 232 188    CMP Latest Ref Rng & Units 06/22/2021 05/23/2021 05/22/2021  Glucose 70 - 99 mg/dL 275(H) 235(H) 250(H)  BUN 6 - 20 mg/dL 15 13 11   Creatinine 0.44 - 1.00 mg/dL 0.99 0.90 0.95  Sodium 135 - 145 mmol/L 141 136 139  Potassium 3.5 - 5.1 mmol/L 4.3 3.6 3.6  Chloride 98 - 111 mmol/L 113(H) 104 108  CO2 22 - 32 mmol/L 20(L) 24 22  Calcium 8.9 - 10.3 mg/dL 9.4 8.6(L) 8.7(L)  Total Protein 6.5 - 8.1 g/dL 6.9 - 5.9(L)  Total Bilirubin 0.3 - 1.2 mg/dL 0.2(L) - 0.4  Alkaline Phos  38 - 126 U/L 90 - 119  AST 15 - 41 U/L 10(L) - 17  ALT 0 - 44 U/L 6 - 14   RADIOGRAPHIC STUDIES: I have personally reviewed the radiological images as listed and agreed with the findings in the report.  05/21/2021: CT angiogram chest: 1. Bilateral occlusive acute pulmonary emboli within the proximal segmental pulmonary arteries. Overall clot burden is severe. Evidence of RIGHT ventricular strain (RV/LV Ratio = 1.15) consistent with at least submassive (intermediate risk) PE. The presence of right heart strain has been associated with an increased risk of morbidity and mortality. Please refer to the "PE Focused" order set in EPIC. 2. No pulmonary infarction.   05/22/2021: Doppler Venous US Lower Extremity: BILATERAL:  - No evidence of deep vein thrombosis seen in the lower extremities,  bilaterally.  -No evidence of popliteal cyst, bilaterally.     ASSESSMENT & PLAN [name] is a 50 y.o. female who presents to the clinic for evaluation for recent bilateral pulmonary emboli. She is currently on Eliquis 5 mg twice daily, tolerating well.   We reviewed provoking factors that can cause venous thromboembolisms including prolonged immobility, travel, surgery, trauma, infection, birth control, smoking, pregnancy, malignancy, and inflammatory conditions. Patient does have history of lupus and is current on hydroxychloroquine, weekly Benlysta and prednisone. This is likely the main risk factor that is associated with the patient developing her PE.   Patient underwent hypercoaguable workup during her hospitalization checking anticardiolipin antibodies and lupus anticoagulant, both were unremarkable.  I recommend patient complete workup with serologic evaluation today. We will check CBC, CMP, beta-2 glycoprotein antibodies, prothrombin gene mutation, Factor 5 Leidein activity, antithrombin III level, homocysteine levels, protein S activity and protein C activity.   If remaining workup is  negative, the recommendation will be indefinite anticoagulation as patient will not have any modifying risk factors to prevent future episodes.   Patient will return to clinic in 6 months for a follow up visit and labs. At that time, we can discuss if maintenance therapy for Eliquis is an option for her.    Orders Placed This Encounter  Procedures   CBC with Differential ([place] Only)    Standing Status:   Future    Number of Occurrences:   1    Standing  Expiration Date:   06/21/2022   CMP ([place] only)    Standing Status:   Future    Number of Occurrences:   1    Standing Expiration Date:   06/21/2022   Beta-2-glycoprotein i abs, IgG/M/A    Standing Status:   Future    Number of Occurrences:   1    Standing Expiration Date:   06/21/2022   Prothrombin gene mutation*    Standing Status:   Future    Number of Occurrences:   1    Standing Expiration Date:   06/21/2022   Factor 5 Leiden*    Standing Status:   Future    Number of Occurrences:   1    Standing Expiration Date:   06/21/2022   Antithrombin III*    Standing Status:   Future    Number of Occurrences:   1    Standing Expiration Date:   06/22/2022   Protein S activity*    Standing Status:   Future    Number of Occurrences:   1    Standing Expiration Date:   06/22/2022   Protein C activity*    Standing Status:   Future    Number of Occurrences:   1    Standing Expiration Date:   06/22/2022   Homocysteine, serum    Standing Status:   Future    Number of Occurrences:   1    Standing Expiration Date:   06/22/2022    All questions were answered. The patient knows to call the clinic with any problems, questions or concerns.  I have spent a total of 60 minutes minutes of face-to-face and non-face-to-face time, preparing to see the patient, obtaining and/or reviewing separately obtained history, performing a medically appropriate examination, counseling and educating the patient, ordering tests, documenting  clinical information in the electronic health record,  and care coordination.   Dede Query, PA-C Department of Hematology/Oncology [place] [facility] Phone: [phone]

## 2021-06-22 ENCOUNTER — Other Ambulatory Visit: Payer: Self-pay

## 2021-06-22 ENCOUNTER — Inpatient Hospital Stay: Payer: Medicaid Other

## 2021-06-22 ENCOUNTER — Encounter: Payer: Self-pay | Admitting: Physician Assistant

## 2021-06-22 ENCOUNTER — Inpatient Hospital Stay: Payer: Medicaid Other | Attending: Physician Assistant | Admitting: Physician Assistant

## 2021-06-22 DIAGNOSIS — I2693 Single subsegmental pulmonary embolism without acute cor pulmonale: Secondary | ICD-10-CM | POA: Insufficient documentation

## 2021-06-22 DIAGNOSIS — I2609 Other pulmonary embolism with acute cor pulmonale: Secondary | ICD-10-CM

## 2021-06-22 DIAGNOSIS — Z8041 Family history of malignant neoplasm of ovary: Secondary | ICD-10-CM | POA: Diagnosis not present

## 2021-06-22 DIAGNOSIS — Z833 Family history of diabetes mellitus: Secondary | ICD-10-CM | POA: Insufficient documentation

## 2021-06-22 DIAGNOSIS — I509 Heart failure, unspecified: Secondary | ICD-10-CM | POA: Diagnosis not present

## 2021-06-22 DIAGNOSIS — Z79899 Other long term (current) drug therapy: Secondary | ICD-10-CM | POA: Diagnosis not present

## 2021-06-22 DIAGNOSIS — Z880 Allergy status to penicillin: Secondary | ICD-10-CM | POA: Insufficient documentation

## 2021-06-22 DIAGNOSIS — I110 Hypertensive heart disease with heart failure: Secondary | ICD-10-CM | POA: Diagnosis not present

## 2021-06-22 DIAGNOSIS — Z79631 Long term (current) use of antimetabolite agent: Secondary | ICD-10-CM | POA: Diagnosis not present

## 2021-06-22 DIAGNOSIS — G4733 Obstructive sleep apnea (adult) (pediatric): Secondary | ICD-10-CM | POA: Insufficient documentation

## 2021-06-22 DIAGNOSIS — E669 Obesity, unspecified: Secondary | ICD-10-CM | POA: Diagnosis not present

## 2021-06-22 DIAGNOSIS — Z7901 Long term (current) use of anticoagulants: Secondary | ICD-10-CM | POA: Insufficient documentation

## 2021-06-22 DIAGNOSIS — Z79624 Long term (current) use of inhibitors of nucleotide synthesis: Secondary | ICD-10-CM | POA: Diagnosis not present

## 2021-06-22 DIAGNOSIS — Z836 Family history of other diseases of the respiratory system: Secondary | ICD-10-CM | POA: Insufficient documentation

## 2021-06-22 DIAGNOSIS — R0602 Shortness of breath: Secondary | ICD-10-CM | POA: Diagnosis not present

## 2021-06-22 DIAGNOSIS — Z8042 Family history of malignant neoplasm of prostate: Secondary | ICD-10-CM | POA: Insufficient documentation

## 2021-06-22 DIAGNOSIS — E119 Type 2 diabetes mellitus without complications: Secondary | ICD-10-CM | POA: Diagnosis not present

## 2021-06-22 DIAGNOSIS — Z86711 Personal history of pulmonary embolism: Secondary | ICD-10-CM | POA: Insufficient documentation

## 2021-06-22 DIAGNOSIS — Z803 Family history of malignant neoplasm of breast: Secondary | ICD-10-CM | POA: Insufficient documentation

## 2021-06-22 DIAGNOSIS — Z809 Family history of malignant neoplasm, unspecified: Secondary | ICD-10-CM | POA: Insufficient documentation

## 2021-06-22 DIAGNOSIS — Z8673 Personal history of transient ischemic attack (TIA), and cerebral infarction without residual deficits: Secondary | ICD-10-CM | POA: Insufficient documentation

## 2021-06-22 DIAGNOSIS — M3500 Sicca syndrome, unspecified: Secondary | ICD-10-CM | POA: Insufficient documentation

## 2021-06-22 DIAGNOSIS — Z87891 Personal history of nicotine dependence: Secondary | ICD-10-CM | POA: Diagnosis not present

## 2021-06-22 DIAGNOSIS — Z8249 Family history of ischemic heart disease and other diseases of the circulatory system: Secondary | ICD-10-CM | POA: Insufficient documentation

## 2021-06-22 DIAGNOSIS — Z560 Unemployment, unspecified: Secondary | ICD-10-CM | POA: Insufficient documentation

## 2021-06-22 DIAGNOSIS — Z885 Allergy status to narcotic agent status: Secondary | ICD-10-CM | POA: Insufficient documentation

## 2021-06-22 DIAGNOSIS — Z8261 Family history of arthritis: Secondary | ICD-10-CM | POA: Insufficient documentation

## 2021-06-22 DIAGNOSIS — Z800 Family history of malignant neoplasm of digestive organs: Secondary | ICD-10-CM | POA: Insufficient documentation

## 2021-06-22 LAB — CMP (CANCER CENTER ONLY)
ALT: 6 U/L (ref 0–44)
AST: 10 U/L — ABNORMAL LOW (ref 15–41)
Albumin: 3.5 g/dL (ref 3.5–5.0)
Alkaline Phosphatase: 90 U/L (ref 38–126)
Anion gap: 8 (ref 5–15)
BUN: 15 mg/dL (ref 6–20)
CO2: 20 mmol/L — ABNORMAL LOW (ref 22–32)
Calcium: 9.4 mg/dL (ref 8.9–10.3)
Chloride: 113 mmol/L — ABNORMAL HIGH (ref 98–111)
Creatinine: 0.99 mg/dL (ref 0.44–1.00)
GFR, Estimated: >60 mL/min (ref >60)
Glucose, Bld: 275 mg/dL — ABNORMAL HIGH (ref 70–99)
Potassium: 4.3 mmol/L (ref 3.5–5.1)
Sodium: 141 mmol/L (ref 135–145)
Total Bilirubin: 0.2 mg/dL — ABNORMAL LOW (ref 0.3–1.2)
Total Protein: 6.9 g/dL (ref 6.5–8.1)

## 2021-06-22 LAB — CBC WITH DIFFERENTIAL (CANCER CENTER ONLY)
Abs Immature Granulocytes: 0.03 10*3/uL (ref 0.00–0.07)
Basophils Absolute: 0 10*3/uL (ref 0.0–0.1)
Basophils Relative: 0 %
Eosinophils Absolute: 0.5 10*3/uL (ref 0.0–0.5)
Eosinophils Relative: 5 %
HCT: 31.8 % — ABNORMAL LOW (ref 36.0–46.0)
Hemoglobin: 10 g/dL — ABNORMAL LOW (ref 12.0–15.0)
Immature Granulocytes: 0 %
Lymphocytes Relative: 21 %
Lymphs Abs: 1.9 10*3/uL (ref 0.7–4.0)
MCH: 28 pg (ref 26.0–34.0)
MCHC: 31.4 g/dL (ref 30.0–36.0)
MCV: 89.1 fL (ref 80.0–100.0)
Monocytes Absolute: 0.5 10*3/uL (ref 0.1–1.0)
Monocytes Relative: 6 %
Neutro Abs: 6.2 10*3/uL (ref 1.7–7.7)
Neutrophils Relative %: 68 %
Platelet Count: 331 10*3/uL (ref 150–400)
RBC: 3.57 MIL/uL — ABNORMAL LOW (ref 3.87–5.11)
RDW: 14.2 % (ref 11.5–15.5)
WBC Count: 9.2 10*3/uL (ref 4.0–10.5)
nRBC: 0 % (ref 0.0–0.2)

## 2021-06-22 LAB — ANTITHROMBIN III: AntiThromb III Func: 132 % — ABNORMAL HIGH (ref 75–120)

## 2021-06-22 LAB — FRUCTOSAMINE: Fructosamine: 351 umol/L — ABNORMAL HIGH (ref 205–285)

## 2021-06-23 ENCOUNTER — Ambulatory Visit: Payer: Self-pay | Admitting: Student

## 2021-06-23 LAB — PROTEIN S ACTIVITY: Protein S Activity: 95 % (ref 63–140)

## 2021-06-23 LAB — PROTEIN C ACTIVITY: Protein C Activity: 137 % (ref 73–180)

## 2021-06-24 MED ORDER — TRULICITY 4.5 MG/0.5ML ~~LOC~~ SOAJ: 4.5000 mg | SUBCUTANEOUS | 3 refills | Status: DC

## 2021-06-25 ENCOUNTER — Telehealth: Payer: Self-pay | Admitting: Physician Assistant

## 2021-06-25 LAB — BETA-2-GLYCOPROTEIN I ABS, IGG/M/A
Beta-2 Glyco I IgG: <9 GPI IgG units (ref 0–20)
Beta-2-Glycoprotein I IgA: <9 GPI IgA units (ref 0–25)
Beta-2-Glycoprotein I IgM: <9 GPI IgM units (ref 0–32)

## 2021-06-25 LAB — HOMOCYSTEINE

## 2021-06-25 NOTE — Telephone Encounter (Signed)
Scheduled per 11/22 los, pt has been called and confirmed appt

## 2021-06-28 ENCOUNTER — Ambulatory Visit: Payer: Medicaid Other | Admitting: Diagnostic Neuroimaging

## 2021-06-28 ENCOUNTER — Encounter: Payer: Self-pay | Admitting: Diagnostic Neuroimaging

## 2021-06-28 ENCOUNTER — Other Ambulatory Visit: Payer: Self-pay

## 2021-06-28 ENCOUNTER — Other Ambulatory Visit: Payer: Self-pay | Admitting: Endocrinology

## 2021-06-28 VITALS — BP 130/75 | HR 97 | Ht 68.0 in | Wt 340.0 lb

## 2021-06-28 DIAGNOSIS — G510 Bell's palsy: Secondary | ICD-10-CM

## 2021-06-28 DIAGNOSIS — R531 Weakness: Secondary | ICD-10-CM

## 2021-06-28 LAB — PROTHROMBIN GENE MUTATION

## 2021-06-28 LAB — FACTOR 5 LEIDEN

## 2021-06-28 MED ORDER — VICTOZA 18 MG/3ML ~~LOC~~ SOPN: 1.8000 mg | PEN_INJECTOR | SUBCUTANEOUS | 3 refills | Status: DC

## 2021-06-28 NOTE — Progress Notes (Signed)
[facility]  PATIENT: [name] DOB: [birth year]  REFERRING CLINICIAN: Idamae Schuller, MD HISTORY FROM: patient  REASON FOR VISIT: new consult    HISTORICAL  CHIEF COMPLAINT:  Chief Complaint  Patient presents with   Cerebrovascular Accident    RM 7 alone Pt is well, has had three strokes that she is aware of. She believes the Last one was around 2014. Denies any new stroke symptoms currently     HISTORY OF PRESENT ILLNESS:   49 year old female here for evaluation of stroke evaluation.  Patient reports history of stroke in 2014 and 2016.  I reviewed prior hospital evaluations and diagnosis was unclear.  Patient had CT scan at that time which was negative.  She had left-sided weakness symptoms and stroke was raised as a possibility but she was not able to get MRI scans at that time due to body habitus.  Some of her symptoms are fluctuating and functional/nonorganic etiologies were raised versus migraine phenomenon.  Patient had evaluation in July 2022 with MRI of the brain which was negative for acute or chronic strokes.  Patient does have hypertension, diabetes, hyperlipidemia, lupus and Sjogren's, currently on medical management.  Also was diagnosed with pulmonary embolism on anticoagulation.   REVIEW OF SYSTEMS: Full 14 system review of systems performed and negative with exception of: As per hpi.  ALLERGIES: Allergies  Allergen Reactions   Metoclopramide Other (See Comments)    Developed restless leg, akathisia type limb movements.    Codeine Itching, Rash and Other (See Comments)   Hydrocodone Rash   Penicillins Itching    Tolerated Unasyn 02/01/2020   Percocet [Oxycodone-Acetaminophen] Hives and Rash    Tolerates dilaudid Tolerates acetaminophen     HOME MEDICATIONS: Outpatient Medications Prior to Visit  Medication Sig Dispense Refill   ACCU-CHEK AVIVA PLUS test strip USE TO TEST BLOOD SUGAR 4 TIMES DAILY 100 strip 2   ADVAIR HFA 230-21  MCG/ACT inhaler Inhale 1-2 puffs into the lungs 2 (two) times daily.     albuterol (PROVENTIL) (2.5 MG/3ML) 0.083% nebulizer solution Take 2.5 mg by nebulization every 6 (six) hours as needed for wheezing.     ALPRAZolam (XANAX) 0.5 MG tablet Take 0.5 mg 2 (two) times daily as needed by mouth for anxiety or sleep.      amLODipine (NORVASC) 10 MG tablet Take 1 tablet (10 mg total) by mouth daily. 30 tablet 0   apixaban (ELIQUIS) 5 MG TABS tablet Take 1 tablet (5 mg total) by mouth 2 (two) times daily. 60 tablet 0   ARIPiprazole (ABILIFY) 5 MG tablet Take 5 mg by mouth daily.     Azathioprine 75 MG TABS Take 150 mg by mouth 2 (two) times daily.     B-D UF III MINI PEN NEEDLES 31G X 5 MM MISC USE AS DIRECTED TWICE A DAY 100 each 2   Belimumab (BENLYSTA) 200 MG/ML SOAJ Inject 200 mg into the skin once a week. Thursdays     Blood Glucose Monitoring Suppl (ONETOUCH VERIO) w/Device KIT Test 4 times daily.  DX E11.9 1 kit 1   carvedilol (COREG) 25 MG tablet Take 1.5 tablets (37.5 mg total) by mouth 2 (two) times daily with a meal. 270 tablet 3   cromolyn (OPTICROM) 4 % ophthalmic solution Place 1 drop into both eyes 2 (two) times daily.     cycloSPORINE (RESTASIS) 0.05 % ophthalmic emulsion Place 1 drop into both eyes in the morning, at noon, and at bedtime.  diclofenac Sodium (VOLTAREN) 1 % GEL Apply 2 g topically 4 (four) times daily. 350 g 0   Dulaglutide (TRULICITY) 4.5 OY/7.7AJ SOPN Inject 4.5 mg as directed once a week. 6 mL 3   DULoxetine (CYMBALTA) 60 MG capsule Take 120 mg by mouth at bedtime.     EASY TOUCH PEN NEEDLES 32G X 4 MM MISC USE TO INJECT INSULIN TWICE DAILY 100 each 9   ENTRESTO 97-103 MG TAKE 1 TABLET BY MOUTH 2 (TWO) TIMES DAILY. (Patient taking differently: Take 1 tablet by mouth 2 (two) times daily.) 180 tablet 3   famotidine (PEPCID) 20 MG tablet Take 20 mg by mouth 2 (two) times daily.     fluticasone (FLONASE) 50 MCG/ACT nasal spray Place 2 sprays into both nostrils  continuous as needed for allergies or rhinitis.     glucose blood (ACCU-CHEK GUIDE) test strip 1 each by Other route 2 (two) times daily. And lancets 2/day 200 each 3   hydroxychloroquine (PLAQUENIL) 200 MG tablet Take 200 mg by mouth 2 (two) times daily.     Insulin NPH, Human,, Isophane, (HUMULIN N KWIKPEN) 100 UNIT/ML Kiwkpen Inject 260 Units into the skin every morning. And pen needles 1/day 270 mL 3   Insulin Syringe-Needle U-100 (EASY TOUCH INSULIN SYRINGE) 31G X 5/16" 1 ML MISC USE AS DIRECTED THREE TIMES A DAY 100 each 10   isosorbide-hydrALAZINE (BIDIL) 20-37.5 MG tablet TAKE TWO (2) TABLETS BY MOUTH THREE TIMES DAILY (Patient taking differently: Take 2 tablets by mouth 3 (three) times daily.) 270 tablet 3   Lancets (ONETOUCH ULTRASOFT) lancets Test 4 times daily DX E11.9 100 each 12   levothyroxine (SYNTHROID) 50 MCG tablet Take 1 tablet (50 mcg total) by mouth daily at 6 (six) AM.     megestrol (MEGACE) 40 MG tablet Take 40 mg by mouth daily.  3   metFORMIN (GLUCOPHAGE-XR) 500 MG 24 hr tablet Take 2 tablets (1,000 mg total) by mouth 2 (two) times daily. 180 tablet 3   Multiple Vitamin (MULTIVITAMIN WITH MINERALS) TABS tablet Take 1 tablet by mouth daily.     oxybutynin (DITROPAN XL) 15 MG 24 hr tablet Take 15 mg by mouth daily.      predniSONE (DELTASONE) 1 MG tablet Take 2 mg by mouth daily.     pregabalin (LYRICA) 75 MG capsule Take 75 mg 3 (three) times daily by mouth.      QUEtiapine (SEROQUEL) 300 MG tablet Take 300 mg by mouth at bedtime.     rosuvastatin (CRESTOR) 20 MG tablet Take 1 tablet (20 mg total) by mouth daily. 90 tablet 3   scopolamine (TRANSDERM-SCOP) 1 MG/3DAYS Place 1 patch (1.5 mg total) onto the skin every 3 (three) days as needed (nausea). Do not wear for more than 72 hours. 10 patch 0   spironolactone (ALDACTONE) 50 MG tablet Take 1 tablet (50 mg total) by mouth daily. 90 tablet 3   sucralfate (CARAFATE) 1 GM/10ML suspension Take 10 mLs by mouth 2 (two) times  daily.     topiramate (TOPAMAX) 25 MG tablet Take 75 mg by mouth at bedtime.     torsemide (DEMADEX) 20 MG tablet Take 1 tablet (20 mg total) by mouth at bedtime. 90 tablet 3   trimethobenzamide (TIGAN) 300 MG capsule Take 1 capsule (300 mg total) by mouth 3 (three) times daily as needed for nausea/vomiting. Do not take with the Scopolamine unless nausea is not improving 10 capsule 0   No facility-administered medications prior to visit.  PAST MEDICAL HISTORY: Past Medical History:  Diagnosis Date   Acute renal failure (HCC)     Intractable nausea vomiting secondary to diabetic gastroparesis causing dehydration and acute renal failure /notes 04/01/2013   Anginal pain ([place])    Anxiety    Bell's palsy 08/09/2010   Bicornuate uterus    CAP (community acquired pneumonia) 10/2011   Archie Endo 11/08/2011   Chest pain 01/13/2014   CHF (congestive heart failure) ([place])    Diabetic gastroparesis associated with type 2 diabetes mellitus ([place])    this is presumed diagnoses, not confirmed by any studies.    Eczema    Family history of malignant neoplasm of breast    Family history of malignant neoplasm of ovary    GERD (gastroesophageal reflux disease)    High cholesterol    Hypertension    Liver lesion 08/13/2019   Liver mass 2014   biopsied 03/2013 at [facility], not malignant.  is to undergo radiologic ablation of the mass in sept/October 2014.    Lupus ([place])    Migraines    "maybe a couple times/yr" (04/01/2013)   Obesity    Obstructive sleep apnea on CPAP 2011   Oxygen at nights   SJOGREN'S SYNDROME 08/09/2010   SLE (systemic lupus erythematosus) ([place])    Archie Endo 12/01/2010    PAST SURGICAL HISTORY: Past Surgical History:  Procedure Laterality Date   BREAST CYST EXCISION Left 08/2005   epidermoid   CARDIAC CATHETERIZATION  02/07/2012   ECTOPIC PREGNANCY SURGERY  1999   ESOPHAGOGASTRODUODENOSCOPY N/A 02/26/2013   Procedure: ESOPHAGOGASTRODUODENOSCOPY (EGD);  Surgeon: Irene Shipper,  MD;  Location: [facility] END[facility]COPY;  Service: Endoscopy;  Laterality: N/A;   ESOPHAGOGASTRODUODENOSCOPY N/A 03/02/2015   Procedure: ESOPHAGOGASTRODUODENOSCOPY (EGD);  Surgeon: Milus Banister, MD;  Location: [place];  Service: Endoscopy;  Laterality: N/A;   ESOPHAGOGASTRODUODENOSCOPY N/A 02/23/2021   Procedure: ESOPHAGOGASTRODUODENOSCOPY (EGD);  Surgeon: Wilford Corner, MD;  Location: [place];  Service: Endoscopy;  Laterality: N/A;  possible dilation   HERNIA REPAIR     LEFT AND RIGHT HEART CATHETERIZATION WITH CORONARY ANGIOGRAM N/A 02/07/2012   Procedure: LEFT AND RIGHT HEART CATHETERIZATION WITH CORONARY ANGIOGRAM;  Surgeon: Laverda Page, MD;  Location[facility] CATH LAB;  Service: Cardiovascular;  Laterality: N/A;   LIVER BIOPSY  03/2013   liver mass/medical hx noted above   MUSCLE BIOPSY     for lupus/notes 12/06/2618   UMBILICAL HERNIA REPAIR  1980's    FAMILY HISTORY: Family History  Problem Relation Age of Onset   Diabetes Mother    Asthma Mother    Urticaria Mother    Hypertension Mother    Stroke Mother    Ovarian cancer Sister 71       maternal half-sister; RAD51C positive   Asthma Brother    Asthma Brother    Breast cancer Maternal Aunt 60       currently 50   Cancer Maternal Aunt        unk. primary   Cancer Maternal Aunt        unk. primary   Ovarian cancer Maternal Aunt        deceased 64s   Breast cancer Maternal Aunt        deceased 32s   Stomach cancer Maternal Uncle        dx 42s; deceased   Prostate cancer Maternal Uncle        deceased 64s   Cancer Paternal Aunt        unk. primary;  deceased 18s   Prostate cancer Paternal Uncle        deceased 71s   Breast cancer Cousin        deceased 68s; daughter of mat uncle with stomach ca   Hypertension Other    Stroke Other    Arthritis Other    Allergic rhinitis Neg Hx    Angioedema Neg Hx     SOCIAL HISTORY: Social History   Socioeconomic History   Marital status: Single    Spouse name: Not  on file   Number of children: 1   Years of education: Not on file   Highest education level: Not on file  Occupational History    Employer: UNEMPLOYED    Comment: student  Tobacco Use   Smoking status: Former    Packs/day: 0.50    Years: 3.00    Pack years: 1.50    Types: Cigarettes    Quit date: 06/02/1991    Years since quitting: 30.0   Smokeless tobacco: Never   Tobacco comments:    quit 28 yrs ago  Vaping Use   Vaping Use: Never used  Substance and Sexual Activity   Alcohol use: No    Alcohol/week: 0.0 standard drinks   Drug use: No   Sexual activity: Yes    Partners: Male    Birth control/protection: None  Other Topics Concern   Not on file  Social History Narrative   Regular exercise-yes   Social Determinants of Health   Financial Resource Strain: Not on file  Food Insecurity: Not on file  Transportation Needs: Not on file  Physical Activity: Not on file  Stress: Not on file  Social Connections: Not on file  Intimate Partner Violence: Not on file     PHYSICAL EXAM  GENERAL EXAM/CONSTITUTIONAL: Vitals:  Vitals:   06/28/21 1054  BP: 130/75  Pulse: 97  Weight: (!) 340 lb (154.2 kg)  Height: 5' 8"  (1.727 m)   Body mass index is 51.7 kg/m. Wt Readings from Last 3 Encounters:  06/28/21 (!) 340 lb (154.2 kg)  06/18/21 (!) 334 lb 3.2 oz (151.6 kg)  06/09/21 (!) 337 lb (152.9 kg)   Patient is in no distress; well developed, nourished and groomed; neck is supple  CARDIOVASCULAR: Examination of carotid arteries is normal; no carotid bruits Regular rate and rhythm, no murmurs Examination of peripheral vascular system by observation and palpation is normal  EYES: Ophthalmoscopic exam of optic discs and posterior segments is normal; no papilledema or hemorrhages No results found.  MUSCULOSKELETAL: Gait, strength, tone, movements noted in Neurologic exam below  NEUROLOGIC: MENTAL STATUS:  No flowsheet data found. awake, alert, oriented to person,  place and time recent and remote memory intact normal attention and concentration language fluent, comprehension intact, naming intact fund of knowledge appropriate  CRANIAL NERVE:  2nd - no papilledema on fundoscopic exam 2nd, 3rd, 4th, 6th - pupils equal and reactive to light, visual fields full to confrontation, extraocular muscles intact, no nystagmus 5th - facial sensation symmetric 7th - facial strength --> DECR LEFT UPPER AND OWER FACIAL WEAKNESS; SYNKINESIS ON LEFT SIDE 8th - hearing intact 9th - palate elevates symmetrically, uvula midline 11th - shoulder shrug symmetric 12th - tongue protrusion midline  MOTOR:  normal bulk and tone, full strength in the BUE, BLE; EXCEPT GIVE WAY WEAKNESS IN LUE AND LLE  SENSORY:  normal and symmetric to light touch  COORDINATION:  finger-nose-finger, fine finger movements normal  REFLEXES:  deep tendon reflexes TRACE and  symmetric  GAIT/STATION:  narrow based gait; ANTALGIC GAIT WITH CANE     DIAGNOSTIC DATA (LABS, IMAGING, TESTING) - I reviewed patient records, labs, notes, testing and imaging myself where available.  Lab Results  Component Value Date   WBC 9.2 06/22/2021   HGB 10.0 (L) 06/22/2021   HCT 31.8 (L) 06/22/2021   MCV 89.1 06/22/2021   PLT 331 06/22/2021      Component Value Date/Time   NA 141 06/22/2021 1435   NA 140 11/01/2018 1525   K 4.3 06/22/2021 1435   CL 113 (H) 06/22/2021 1435   CO2 20 (L) 06/22/2021 1435   GLUCOSE 275 (H) 06/22/2021 1435   BUN 15 06/22/2021 1435   BUN 8 11/01/2018 1525   CREATININE 0.99 06/22/2021 1435   CALCIUM 9.4 06/22/2021 1435   PROT 6.9 06/22/2021 1435   ALBUMIN 3.5 06/22/2021 1435   AST 10 (L) 06/22/2021 1435   ALT 6 06/22/2021 1435   ALKPHOS 90 06/22/2021 1435   BILITOT 0.2 (L) 06/22/2021 1435   GFRNONAA >60 06/22/2021 1435   GFRAA >60 02/07/2020 0315   Lab Results  Component Value Date   CHOL 203 (H) 05/22/2021   HDL 58 05/22/2021   LDLCALC 117 (H)  05/22/2021   TRIG 138 05/22/2021   CHOLHDL 3.5 05/22/2021   Lab Results  Component Value Date   HGBA1C 12.2 (A) 06/02/2021   Lab Results  Component Value Date   FDVOUZHQ60 479 09/13/2019   Lab Results  Component Value Date   TSH 2.871 05/21/2021    02/19/21 MRI brain [I reviewed images myself and agree with interpretation. -VRP]  1. Normal brain.  No subarachnoid hemorrhage.    ASSESSMENT AND PLAN  50 y.o. year old female here with:   Dx:  1. Left-sided weakness   2. Left-sided Bell's palsy      PLAN:  REPORTED HISTORY OF STROKE EVALUATION (not clear by CT, MRI and prior evaluations) - no history of prior stroke evident on MRI brain from July 2022 - prior eval in 2014, 2016 were negative for stroke by CT; prior symptoms could have been migraine related or stress reactions - continue primary stroke prevention with treatment of HTN, DM, lipids  LEFT BELL'S PALSY (2002) - stable   Return for return to PCP.    Penni Bombard, MD 98/72/1587, 27:61 AM Certified in Neurology, Neurophysiology and Neuroimaging  [facility] [address] [place], [place] [address] [phone]

## 2021-06-28 NOTE — Patient Instructions (Signed)
REPORTED HISTORY OF STROKE EVALUATION (not clear by CT, MRI and prior evaluations) - no history of prior stroke evident on MRI brain from July 2022 - prior eval in 2014, 2016 were negative for stroke by CT; prior symptoms could have been migraine related or stress reactions - continue primary stroke prevention with treatment of HTN, DM, lipids  LEFT BELL'S PALSY (2002) - stable

## 2021-06-29 ENCOUNTER — Telehealth: Payer: Self-pay | Admitting: Physician Assistant

## 2021-06-29 ENCOUNTER — Other Ambulatory Visit: Payer: Self-pay | Admitting: Physician Assistant

## 2021-06-29 DIAGNOSIS — Z809 Family history of malignant neoplasm, unspecified: Secondary | ICD-10-CM

## 2021-06-29 NOTE — Progress Notes (Signed)
No showed

## 2021-06-29 NOTE — Telephone Encounter (Signed)
I called Mr. [name] to review the lab results form 06/22/2021.  The remaining hypercoagulable work-up was negative.  Since we could not identify a modifying risk factor that caused patient's pulmonary emboli, the recommendation is indefinite anticoagulation.  Patient will continue on Eliquis at current dose.  She will return to the clinic in 6 months and at that time we will discuss if she is eligible for the maintenance therapy.  Patient expressed understanding and satisfaction with the plan provided.

## 2021-06-29 NOTE — Telephone Encounter (Signed)
Patient has now been informed of Victoza being sent into her pharmacy instead of Trulicity.

## 2021-07-01 ENCOUNTER — Telehealth: Payer: Self-pay | Admitting: Physician Assistant

## 2021-07-01 ENCOUNTER — Encounter: Payer: Self-pay | Admitting: Internal Medicine

## 2021-07-01 ENCOUNTER — Other Ambulatory Visit: Payer: Self-pay

## 2021-07-01 ENCOUNTER — Ambulatory Visit (INDEPENDENT_AMBULATORY_CARE_PROVIDER_SITE_OTHER): Payer: Medicaid Other | Admitting: Internal Medicine

## 2021-07-01 VITALS — BP 124/80 | HR 83 | Temp 97.7°F | Ht 68.0 in | Wt 338.0 lb

## 2021-07-01 DIAGNOSIS — R0609 Other forms of dyspnea: Secondary | ICD-10-CM

## 2021-07-01 DIAGNOSIS — I2602 Saddle embolus of pulmonary artery with acute cor pulmonale: Secondary | ICD-10-CM

## 2021-07-01 NOTE — Patient Instructions (Addendum)
Please schedule follow up scheduled with myself in 2 months.  If my schedule is not open yet, we will contact you with a reminder closer to that time.  Before your next visit I would like you to have: Echocardiogram. You already have one scheduled on Feb 9th so I will see you back after that

## 2021-07-01 NOTE — Telephone Encounter (Signed)
Scheduled per sch msg. Called and spoke with patient. Confirmed appt  

## 2021-07-01 NOTE — Progress Notes (Signed)
[name]    [id number]    13-Nov-1970  Primary Care Physician:Boyd, Dola Factor, MD Date of Appointment: 07/01/2021 Established Patient Visit  Chief complaint:   Chief Complaint  Patient presents with   Follow-up    She feels she is about the same and has some shortness of breath.      HPI: [name] is a 50 y.o. who was hospitalized with acute submassive PE.   Interval Updates: Still fatigued. Lives independently but has an aide that comes out daily for helping with ADLs. She does have dyspnea with daily activities and it takes a long time to get ready in the morning. Dyspnea has definitely worsened since blood clot. She was ambulated and did not require oxygen at discharge. She reports dropping to 85% at home with minimal exertion.   Has OSA on CPAP. She feels nervous after having her PE. No bleeding issues with eliquis. No falls.   I have reviewed the patient's family social and past medical history and updated as appropriate.   Past Medical History:  Diagnosis Date   Acute renal failure (HCC)     Intractable nausea vomiting secondary to diabetic gastroparesis causing dehydration and acute renal failure /notes 04/01/2013   Anginal pain ([place])    Anxiety    Bell's palsy 08/09/2010   Bicornuate uterus    CAP (community acquired pneumonia) 10/2011   Archie Endo 11/08/2011   Chest pain 01/13/2014   CHF (congestive heart failure) ([place])    Diabetic gastroparesis associated with type 2 diabetes mellitus ([place])    this is presumed diagnoses, not confirmed by any studies.    Eczema    Family history of malignant neoplasm of breast    Family history of malignant neoplasm of ovary    GERD (gastroesophageal reflux disease)    High cholesterol    Hypertension    Liver lesion 08/13/2019   Liver mass 2014   biopsied 03/2013 at [facility], not malignant.  is to undergo radiologic ablation of the mass in sept/October 2014.    Lupus ([place])    Migraines    "maybe a  couple times/yr" (04/01/2013)   Obesity    Obstructive sleep apnea on CPAP 2011   Oxygen at nights   SJOGREN'S SYNDROME 08/09/2010   SLE (systemic lupus erythematosus) ([place])    Archie Endo 12/01/2010    Past Surgical History:  Procedure Laterality Date   BREAST CYST EXCISION Left 08/2005   epidermoid   CARDIAC CATHETERIZATION  02/07/2012   ECTOPIC PREGNANCY SURGERY  1999   ESOPHAGOGASTRODUODENOSCOPY N/A 02/26/2013   Procedure: ESOPHAGOGASTRODUODENOSCOPY (EGD);  Surgeon: Irene Shipper, MD;  Location[facility] ENDOSCOPY;  Service: Endoscopy;  Laterality: N/A;   ESOPHAGOGASTRODUODENOSCOPY N/A 03/02/2015   Procedure: ESOPHAGOGASTRODUODENOSCOPY (EGD);  Surgeon: Milus Banister, MD;  Location: [place];  Service: Endoscopy;  Laterality: N/A;   ESOPHAGOGASTRODUODENOSCOPY N/A 02/23/2021   Procedure: ESOPHAGOGASTRODUODENOSCOPY (EGD);  Surgeon: Wilford Corner, MD;  Location: [place];  Service: Endoscopy;  Laterality: N/A;  possible dilation   HERNIA REPAIR     LEFT AND RIGHT HEART CATHETERIZATION WITH CORONARY ANGIOGRAM N/A 02/07/2012   Procedure: LEFT AND RIGHT HEART CATHETERIZATION WITH CORONARY ANGIOGRAM;  Surgeon: Laverda Page, MD;  Location: [facility] CATH LAB;  Service: Cardiovascular;  Laterality: N/A;   LIVER BIOPSY  03/2013   liver mass/medical hx noted above   MUSCLE BIOPSY     for lupus/notes 0/09/5007   UMBILICAL HERNIA REPAIR  1980's  Family History  Problem Relation Age of Onset   Diabetes Mother    Asthma Mother    Urticaria Mother    Hypertension Mother    Stroke Mother    Ovarian cancer Sister 3       maternal half-sister; RAD51C positive   Asthma Brother    Asthma Brother    Breast cancer Maternal Aunt 73       currently 3   Cancer Maternal Aunt        unk. primary   Cancer Maternal Aunt        unk. primary   Ovarian cancer Maternal Aunt        deceased 61s   Breast cancer Maternal Aunt        deceased 44s   Stomach cancer Maternal Uncle        dx 9s;  deceased   Prostate cancer Maternal Uncle        deceased 87s   Cancer Paternal Aunt        unk. primary; deceased 59s   Prostate cancer Paternal Uncle        deceased 74s   Breast cancer Cousin        deceased 21s; daughter of mat uncle with stomach ca   Hypertension Other    Stroke Other    Arthritis Other    Allergic rhinitis Neg Hx    Angioedema Neg Hx     Social History   Occupational History    Employer: UNEMPLOYED    Comment: student  Tobacco Use   Smoking status: Former    Packs/day: 0.50    Years: 3.00    Pack years: 1.50    Types: Cigarettes    Quit date: 06/02/1991    Years since quitting: 30.1   Smokeless tobacco: Never   Tobacco comments:    quit 28 yrs ago  Vaping Use   Vaping Use: Never used  Substance and Sexual Activity   Alcohol use: No    Alcohol/week: 0.0 standard drinks   Drug use: No   Sexual activity: Yes    Partners: Male    Birth control/protection: None     Physical Exam: Blood pressure 124/80, pulse 83, temperature 97.7 F (36.5 C), temperature source Oral, height _0  (1.727 m), weight (!) 338 lb (153.3 kg), last menstrual period 03/24/2017, SpO2 99 %.  Gen:      No acute distress, fatigued ENT:  mallampati IV, no nasal polyps, mucus membranes moist Lungs:    diminished ,No increased respiratory effort, symmetric chest wall excursion, clear to auscultation bilaterally, no wheezes or crackles CV:         Regular rate and rhythm; no murmurs, rubs, or gallops.  No pedal edema   Data Reviewed: Imaging: I have personally reviewed the CTPE study - lung windows clear, bilaterl PE with right heart strain.   Negative lower extremity dopplers   PFTs: No flowsheet data found.   Labs: Reviewed hypercoag  Troponin elevated while hospitalized.   Immunization status: Immunization History  Administered Date(s) Administered   H1N1 07/16/2008   Influenza Split 10/10/2012, 05/04/2016, 08/23/2019   Influenza,inj,Quad PF,6+ Mos  05/04/2016, 04/19/2017, 04/23/2018, 08/23/2019   Influenza-Unspecified 05/02/2011, 05/31/2015   Pneumococcal Polysaccharide-23 11/09/2011    External Records Personally Reviewed: hospital stay, internal medicine, endocrinology  Assessment:  Dyspnea on exertion Acute submassive pulmonary embolism, unprovoked with history of SLE SLE on imuran, plaquenil, lyrica OSA on CPAP  Plan/Recommendations: Continue eliquis Recommend indefinite anticoagulation as long as  no contraindications.  CT scan without evidence of interstitial changes.  Needs echo end of Jan 2023. I have ordered. Needs follow up with me afterwards.   She already has an echo schedule with cardiology.   Ambulatory desaturation study today did not show desaturation - she went down to 94%.   Return to Care: Return in about 2 months (around 09/01/2021).   Lenice Llamas, MD Pulmonary and [place]

## 2021-07-05 ENCOUNTER — Ambulatory Visit: Payer: Medicaid Other | Admitting: Cardiology

## 2021-07-19 ENCOUNTER — Inpatient Hospital Stay: Payer: Medicaid Other

## 2021-07-19 ENCOUNTER — Encounter: Payer: Self-pay | Admitting: Licensed Clinical Social Worker

## 2021-07-19 ENCOUNTER — Inpatient Hospital Stay: Payer: Medicaid Other | Attending: Physician Assistant | Admitting: Licensed Clinical Social Worker

## 2021-07-19 DIAGNOSIS — Z803 Family history of malignant neoplasm of breast: Secondary | ICD-10-CM | POA: Diagnosis not present

## 2021-07-19 DIAGNOSIS — Z8041 Family history of malignant neoplasm of ovary: Secondary | ICD-10-CM | POA: Diagnosis not present

## 2021-07-19 DIAGNOSIS — Z8042 Family history of malignant neoplasm of prostate: Secondary | ICD-10-CM

## 2021-07-19 NOTE — Progress Notes (Signed)
REFERRING PROVIDER: Lincoln Brigham, PA-C [place],  [place] [address]  PRIMARY PROVIDER:  Bartholome Bill, MD  PRIMARY REASON FOR VISIT:  1. Family history of breast cancer   2. Family history of prostate cancer   3. Family history of malignant neoplasm of ovary      HISTORY OF PRESENT ILLNESS:   [name], a 50 y.o. female, was seen for a [facility] cancer genetics consultation at the request of Dr. Charlies Silvers due to a family history of cancer.  [name] presents to clinic today to discuss the possibility of a hereditary predisposition to cancer, genetic testing, and to further clarify her future cancer risks, as well as potential cancer risks for family members.   [name] is a 50 y.o. female with no personal history of cancer.  She saw Steele Berg in 2015 for family variant testing of the RAD51C gene. This was the only gene tested and she came back negative.   CANCER HISTORY:  Oncology History   No history exists.     RISK FACTORS:  Menarche was at age 52.  First live birth at age 1.  OCP use for approximately 2 years.  Ovaries intact: yes.  Hysterectomy: no.  Colonoscopy: no; not examined. Mammogram within the last year: yes. Number of breast biopsies: 0. Up to date with pelvic exams: yes. Any excessive radiation exposure in the past: no  Past Medical History:  Diagnosis Date   Acute renal failure (HCC)     Intractable nausea vomiting secondary to diabetic gastroparesis causing dehydration and acute renal failure /notes 04/01/2013   Anginal pain ([place])    Anxiety    Bell's palsy 08/09/2010   Bicornuate uterus    CAP (community acquired pneumonia) 10/2011   Archie Endo 11/08/2011   Chest pain 01/13/2014   CHF (congestive heart failure) ([place])    Diabetic gastroparesis associated with type 2 diabetes mellitus ([place])    this is presumed diagnoses, not confirmed by any studies.    Eczema    Family history of breast cancer    Family history of malignant  neoplasm of breast    Family history of malignant neoplasm of ovary    Family history of ovarian cancer    Family history of prostate cancer    GERD (gastroesophageal reflux disease)    High cholesterol    Hypertension    Liver lesion 08/13/2019   Liver mass 2014   biopsied 03/2013 at [facility], not malignant.  is to undergo radiologic ablation of the mass in sept/October 2014.    Lupus ([place])    Migraines    "maybe a couple times/yr" (04/01/2013)   Obesity    Obstructive sleep apnea on CPAP 2011   Oxygen at nights   SJOGREN'S SYNDROME 08/09/2010   SLE (systemic lupus erythematosus) ([place])    Archie Endo 12/01/2010    Past Surgical History:  Procedure Laterality Date   BREAST CYST EXCISION Left 08/2005   epidermoid   CARDIAC CATHETERIZATION  02/07/2012   ECTOPIC PREGNANCY SURGERY  1999   ESOPHAGOGASTRODUODENOSCOPY N/A 02/26/2013   Procedure: ESOPHAGOGASTRODUODENOSCOPY (EGD);  Surgeon: Irene Shipper, MD;  [facility] END[facility]COPY;  Service: Endoscopy;  Laterality: N/A;   ESOPHAGOGASTRODUODENOSCOPY N/A 03/02/2015   Procedure: ESOPHAGOGASTRODUODENOSCOPY (EGD);  Surgeon: Milus Banister, MD;  Location: [place];  Service: Endoscopy;  Laterality: N/A;   ESOPHAGOGASTRODUODENOSCOPY N/A 02/23/2021   Procedure: ESOPHAGOGASTRODUODENOSCOPY (EGD);  Surgeon: Wilford Corner, MD;  Location: [place];  Service: Endoscopy;  Laterality:  N/A;  possible dilation   HERNIA REPAIR     LEFT AND RIGHT HEART CATHETERIZATION WITH CORONARY ANGIOGRAM N/A 02/07/2012   Procedure: LEFT AND RIGHT HEART CATHETERIZATION WITH CORONARY ANGIOGRAM;  Surgeon: Laverda Page, MD;  Location: [facility] CATH LAB;  Service: Cardiovascular;  Laterality: N/A;   LIVER BIOPSY  03/2013   liver mass/medical hx noted above   MUSCLE BIOPSY     for lupus/notes 11/01/5954   UMBILICAL HERNIA REPAIR  1980's    Social History   Socioeconomic History   Marital status: Single    Spouse name: Not on file   Number of children: 1    Years of education: Not on file   Highest education level: Not on file  Occupational History    Employer: UNEMPLOYED    Comment: student  Tobacco Use   Smoking status: Former    Packs/day: 0.50    Years: 3.00    Pack years: 1.50    Types: Cigarettes    Quit date: 06/02/1991    Years since quitting: 30.1   Smokeless tobacco: Never   Tobacco comments:    quit 28 yrs ago  Vaping Use   Vaping Use: Never used  Substance and Sexual Activity   Alcohol use: No    Alcohol/week: 0.0 standard drinks   Drug use: No   Sexual activity: Yes    Partners: Male    Birth control/protection: None  Other Topics Concern   Not on file  Social History Narrative   Regular exercise-yes   Social Determinants of Health   Financial Resource Strain: Not on file  Food Insecurity: Not on file  Transportation Needs: Not on file  Physical Activity: Not on file  Stress: Not on file  Social Connections: Not on file     FAMILY HISTORY:  We obtained a detailed, 4-generation family history.  Significant diagnoses are listed below: Family History  Problem Relation Age of Onset   Diabetes Mother    Asthma Mother    Urticaria Mother    Hypertension Mother    Stroke Mother    Ovarian cancer Sister 29       maternal half-sister; RAD51C positive   Asthma Brother    Asthma Brother    Breast cancer Maternal Aunt 60       currently 3   Cancer Maternal Aunt        unk. primary   Cancer Maternal Aunt        unk. primary   Ovarian cancer Maternal Aunt        deceased 68s   Breast cancer Maternal Aunt        deceased 32s   Breast cancer Maternal Aunt    Stomach cancer Maternal Uncle        dx 26s; deceased   Prostate cancer Maternal Uncle        deceased 43s   Breast cancer Cousin        deceased 91s; daughter of mat uncle with stomach ca   Hypertension Other    Stroke Other    Arthritis Other    Allergic rhinitis Neg Hx    Angioedema Neg Hx    [name] has 1 son. She has 1 full brother, 2  full sisters, 3 maternal half sisters and 1 maternal half brother. One of her maternal half sisters was diagnosed with ovarian cancer and died at 93. She had genetic testing that reportedly revealed a RAD51C mutation. Patient was tested for this mutation in  2015 with Steele Berg and tested negative. None of patient's other siblings have had cancer.  [name] father died at 63, no cancer. Patient had 8 or 9 paternal aunts/uncles, limited info, no known cancers. No information about paternal grandparents.  [name] mother died at 38, no cancer history. Patient had 8 maternal aunts, 3 maternal uncles. Three aunts have had breast cancer, one died in her 58s, unknown ages for others. Another aunt had ovarian cancer and died in her 50s. An uncle had prostate cancer and died in his 66s, another uncle had stomach cancer and died in his 37s and his daughter had breast cancer and died in her 62s. Maternal grandparents died over 31.  [name] is unaware of previous family history of genetic testing for hereditary cancer risks. Patient's maternal ancestors are of African American descent, and paternal ancestors are of African American descent. There is no reported Ashkenazi Jewish ancestry. There is no known consanguinity.     GENETIC COUNSELING ASSESSMENT: [name] is a 50 y.o. female with a family history of cancer which is somewhat suggestive of a hereditary cancer syndrome and predisposition to cancer. We, therefore, discussed and recommended the following at today's visit.   DISCUSSION: We discussed that approximately 10% of  cancer is hereditary. Most cases of hereditary breast/ovarian/prostate cancer are associated with BRCA1/BRCA2 genes, although there are other genes associated with hereditary cancer as well including RAD51C which she already had tested in 2015. It's possible that there is another hereditary cause for the cancer in the family that she could consider testing for, it is also unknown  if the RAD51C mutation is from patient's mother or her half sister's father. Cancers and risks are gene specific.  We discussed that testing is beneficial for several reasons including knowing about other cancer risks, identifying potential screening and risk-reduction options that may be appropriate, and to understand if other family members could be at risk for cancer and allow them to undergo genetic testing.   We reviewed the characteristics, features and inheritance patterns of hereditary cancer syndromes. We also discussed genetic testing, including the appropriate family members to test, the process of testing, insurance coverage and turn-around-time for results. We discussed the implications of a negative, positive and/or variant of uncertain significant result. We recommended [name] pursue genetic testing for the [facility] Multi-Cancer +RNA  gene panel.   The Multi-Cancer Panel + RNA offered by [facility] includes sequencing and/or deletion duplication testing of the following 84 genes: AIP, ALK, APC, ATM, AXIN2,BAP1,  BARD1, BLM, BMPR1A, BRCA1, BRCA2, BRIP1, CASR, CDC73, CDH1, CDK4, CDKN1B, CDKN1C, CDKN2A (p14ARF), CDKN2A (p16INK4a), CEBPA, CHEK2, CTNNA1, DICER1, DIS3L2, EGFR (c.2369C>T, p.Thr790Met variant only), EPCAM (Deletion/duplication testing only), FH, FLCN, GATA2, GPC3, GREM1 (Promoter region deletion/duplication testing only), HOXB13 (c.251G>A, p.Gly84Glu), HRAS, KIT, MAX, MEN1, MET, MITF (c.952G>A, p.Glu318Lys variant only), MLH1, MSH2, MSH3, MSH6, MUTYH, NBN, NF1, NF2, NTHL1, PALB2, PDGFRA, PHOX2B, PMS2, POLD1, POLE, POT1, PRKAR1A, PTCH1, PTEN, RAD50, RAD51C, RAD51D, RB1, RECQL4, RET, RUNX1, SDHAF2, SDHA (sequence changes only), SDHB, SDHC, SDHD, SMAD4, SMARCA4, SMARCB1, SMARCE1, STK11, SUFU, TERC, TERT, TMEM127, TP53, TSC1, TSC2, VHL, WRN and WT1.  Based on [name]'s family history of cancer, she meets medical criteria for genetic testing. Despite that she meets criteria, she may still  have an out of pocket cost. We discussed that if her out of pocket cost for testing is over $100, the laboratory will call and confirm whether she wants to proceed with testing.  If the out of pocket  cost of testing is less than $100 she will be billed by the genetic testing laboratory.   PLAN: After considering the risks, benefits, and limitations, [name] provided informed consent to pursue genetic testing and the blood sample will be sent to [facility] for analysis of the Multi-Cancer+RNA panel. She will have her blood drawn 12/27. Results should be available within approximately 2-3 weeks' time, at which point they will be disclosed by telephone to [name], as will any additional recommendations warranted by these results. [name] will receive a summary of her genetic counseling visit and a copy of her results once available. This information will also be available in Epic.   [name] questions were answered to her satisfaction today. Our contact information was provided should additional questions or concerns arise. Thank you for the referral and allowing [place] to share in the care of your patient.   [name] Rogue, MS, [facility] Genetic Counselor [email or web address] Phone: [phone]  The patient was seen for a total of 25 minutes in virtual genetic counseling.  Patient was seen alone.  Dr. Grayland Ormond was available for discussion regarding this case.   _______________________________________________________________________ For Office Staff:  Number of people involved in session: 1 Was an Intern/ student involved with case: no

## 2021-07-26 ENCOUNTER — Encounter (HOSPITAL_COMMUNITY): Payer: Self-pay

## 2021-07-26 ENCOUNTER — Emergency Department (HOSPITAL_COMMUNITY)
Admission: EM | Admit: 2021-07-26 | Discharge: 2021-07-26 | Disposition: A | Discharge status: Home / Self Care | Payer: Medicaid Other | Attending: Emergency Medicine | Admitting: Emergency Medicine

## 2021-07-26 ENCOUNTER — Emergency Department (HOSPITAL_COMMUNITY): Payer: Medicaid Other

## 2021-07-26 ENCOUNTER — Other Ambulatory Visit: Payer: Self-pay

## 2021-07-26 DIAGNOSIS — Z87891 Personal history of nicotine dependence: Secondary | ICD-10-CM | POA: Insufficient documentation

## 2021-07-26 DIAGNOSIS — Z20822 Contact with and (suspected) exposure to covid-19: Secondary | ICD-10-CM | POA: Diagnosis not present

## 2021-07-26 DIAGNOSIS — N9489 Other specified conditions associated with female genital organs and menstrual cycle: Secondary | ICD-10-CM | POA: Diagnosis not present

## 2021-07-26 DIAGNOSIS — Z7984 Long term (current) use of oral hypoglycemic drugs: Secondary | ICD-10-CM | POA: Diagnosis not present

## 2021-07-26 DIAGNOSIS — Z794 Long term (current) use of insulin: Secondary | ICD-10-CM | POA: Insufficient documentation

## 2021-07-26 DIAGNOSIS — E1143 Type 2 diabetes mellitus with diabetic autonomic (poly)neuropathy: Secondary | ICD-10-CM | POA: Insufficient documentation

## 2021-07-26 DIAGNOSIS — Z79899 Other long term (current) drug therapy: Secondary | ICD-10-CM | POA: Diagnosis not present

## 2021-07-26 DIAGNOSIS — Z7901 Long term (current) use of anticoagulants: Secondary | ICD-10-CM | POA: Diagnosis not present

## 2021-07-26 DIAGNOSIS — J069 Acute upper respiratory infection, unspecified: Secondary | ICD-10-CM | POA: Diagnosis not present

## 2021-07-26 DIAGNOSIS — I110 Hypertensive heart disease with heart failure: Secondary | ICD-10-CM | POA: Insufficient documentation

## 2021-07-26 DIAGNOSIS — I5042 Chronic combined systolic (congestive) and diastolic (congestive) heart failure: Secondary | ICD-10-CM | POA: Diagnosis not present

## 2021-07-26 DIAGNOSIS — R0602 Shortness of breath: Secondary | ICD-10-CM | POA: Diagnosis present

## 2021-07-26 LAB — COMPREHENSIVE METABOLIC PANEL
ALT: 15 U/L (ref 0–44)
AST: 14 U/L — ABNORMAL LOW (ref 15–41)
Albumin: 3.6 g/dL (ref 3.5–5.0)
Alkaline Phosphatase: 114 U/L (ref 38–126)
Anion gap: 12 (ref 5–15)
BUN: 13 mg/dL (ref 6–20)
CO2: 15 mmol/L — ABNORMAL LOW (ref 22–32)
Calcium: 8.9 mg/dL (ref 8.9–10.3)
Chloride: 112 mmol/L — ABNORMAL HIGH (ref 98–111)
Creatinine, Ser: 0.87 mg/dL (ref 0.44–1.00)
GFR, Estimated: >60 mL/min (ref >60)
Glucose, Bld: 365 mg/dL — ABNORMAL HIGH (ref 70–99)
Potassium: 3.6 mmol/L (ref 3.5–5.1)
Sodium: 139 mmol/L (ref 135–145)
Total Bilirubin: 0.4 mg/dL (ref 0.3–1.2)
Total Protein: 6.7 g/dL (ref 6.5–8.1)

## 2021-07-26 LAB — I-STAT BETA HCG BLOOD, ED (MC, WL, AP ONLY): I-stat hCG, quantitative: <5 m[IU]/mL (ref <5)

## 2021-07-26 LAB — RESP PANEL BY RT-PCR (FLU A&B, COVID) ARPGX2
Influenza A by PCR: NEGATIVE
Influenza B by PCR: NEGATIVE
SARS Coronavirus 2 by RT PCR: NEGATIVE

## 2021-07-26 LAB — CBC
HCT: 34.4 % — ABNORMAL LOW (ref 36.0–46.0)
Hemoglobin: 10.6 g/dL — ABNORMAL LOW (ref 12.0–15.0)
MCH: 28.8 pg (ref 26.0–34.0)
MCHC: 30.8 g/dL (ref 30.0–36.0)
MCV: 93.5 fL (ref 80.0–100.0)
Platelets: 291 10*3/uL (ref 150–400)
RBC: 3.68 MIL/uL — ABNORMAL LOW (ref 3.87–5.11)
RDW: 16.4 % — ABNORMAL HIGH (ref 11.5–15.5)
WBC: 10.2 10*3/uL (ref 4.0–10.5)
nRBC: 0 % (ref 0.0–0.2)

## 2021-07-26 LAB — TROPONIN I (HIGH SENSITIVITY): Troponin I (High Sensitivity): 6 ng/L (ref <18)

## 2021-07-26 LAB — BRAIN NATRIURETIC PEPTIDE: B Natriuretic Peptide: 32.9 pg/mL (ref 0.0–100.0)

## 2021-07-26 MED ORDER — AZITHROMYCIN 250 MG PO TABS: 250.0000 mg | ORAL_TABLET | Freq: Every day | ORAL | 0 refills | Status: DC

## 2021-07-26 NOTE — ED Notes (Signed)
Reviewed discharge instructions with patient. Follow-up care and medications reviewed. Patient  verbalized understanding. Patient A&Ox4, VSS, and ambulatory with steady gait upon discharge.  °

## 2021-07-26 NOTE — ED Notes (Signed)
Pt reports she is here d/t hyperglycemia CBG 485, elevated HR (pt reports 130's w/ EMS), shob x 1 month since having blood clots, chest tightness & congestion x 1 week. Pt reports being around her sister recently who has a cold.

## 2021-07-26 NOTE — ED Provider Notes (Signed)
[facility] EMERGENCY DEPARTMENT Provider Note   CSN: [id number] Arrival date & time: 07/26/21  7824     History Chief Complaint  Patient presents with   Shortness of Breath   URI   Multiple Complaints    [name] is a 50 y.o. female.  The history is provided by the patient.  Shortness of Breath Severity:  Mild Onset quality:  Gradual Timing:  Constant Progression:  Unchanged Chronicity:  New Context: URI   Relieved by:  Nothing Worsened by:  Nothing Associated symptoms: no abdominal pain, no chest pain, no claudication, no cough, no diaphoresis, no ear pain, no fever, no headaches, no hemoptysis, no neck pain, no PND, no rash, no sore throat, no sputum production, no syncope, no swollen glands, no vomiting and no wheezing   Associated symptoms comment:  Nasal congestion  Risk factors: hx of PE/DVT   Risk factors comment:  Chf, sjogrens, HTN, HLD     Past Medical History:  Diagnosis Date   Acute renal failure (HCC)     Intractable nausea vomiting secondary to diabetic gastroparesis causing dehydration and acute renal failure /notes 04/01/2013   Anginal pain ([place])    Anxiety    Bell's palsy 08/09/2010   Bicornuate uterus    CAP (community acquired pneumonia) 10/2011   Archie Endo 11/08/2011   Chest pain 01/13/2014   CHF (congestive heart failure) ([place])    Diabetic gastroparesis associated with type 2 diabetes mellitus ([place])    this is presumed diagnoses, not confirmed by any studies.    Eczema    Family history of breast cancer    Family history of malignant neoplasm of breast    Family history of malignant neoplasm of ovary    Family history of ovarian cancer    Family history of prostate cancer    GERD (gastroesophageal reflux disease)    High cholesterol    Hypertension    Liver lesion 08/13/2019   Liver mass 2014   biopsied 03/2013 at [facility], not malignant.  is to undergo radiologic ablation of the mass in sept/October 2014.     Lupus ([place])    Migraines    "maybe a couple times/yr" (04/01/2013)   Obesity    Obstructive sleep apnea on CPAP 2011   Oxygen at nights   SJOGREN'S SYNDROME 08/09/2010   SLE (systemic lupus erythematosus) ([place])    Archie Endo 12/01/2010    Patient Active Problem List   Diagnosis Date Noted   Family history of breast cancer 07/19/2021   Family history of prostate cancer 07/19/2021   Pulmonary embolism ([place]) 05/21/2021   Physical debility 08/29/2019   Obesity hypoventilation syndrome ([place]) 08/23/2019   Skin breakdown 08/23/2019   Essential hypertension 08/23/2019   Liver hemorrhage 08/23/2019   Hepatic adenoma    Type 2 diabetes mellitus with hyperlipidemia ([place]) 09/19/2015   Radiculopathy of cervical spine    Family history of malignant neoplasm of breast    Family history of malignant neoplasm of ovary    Intractable nausea and vomiting 11/11/2013   Septate uterus 09/08/2013   Diabetic gastroparesis ([place]) 04/28/2013   Constipation due to pain medication 04/21/2013   Odynophagia 04/04/2013   Liver masses 02/28/2013   Reflux esophagitis 02/26/2013   Morbid obesity ([place]) 10/10/2012   Chronic diastolic CHF (congestive heart failure) ([place]) 10/09/2012   Clinical polymyositis syndrome ([place]) 09/06/2012   Mononeuritis of lower limb 01/12/2012   Hypertension complicating diabetes ([place]) 23/53/6144   Chronic systolic CHF (congestive heart  failure) ([place]) 11/08/2011   HLD (hyperlipidemia) 07/19/2011   Bell's palsy 08/09/2010   SLE 08/09/2010   SJOGREN'S SYNDROME 08/09/2010    Past Surgical History:  Procedure Laterality Date   BREAST CYST EXCISION Left 08/2005   epidermoid   CARDIAC CATHETERIZATION  02/07/2012   ECTOPIC PREGNANCY SURGERY  1999   ESOPHAGOGASTRODUODENOSCOPY N/A 02/26/2013   Procedure: ESOPHAGOGASTRODUODENOSCOPY (EGD);  Surgeon: Irene Shipper, MD;  Location: [facility] ENDOSCOPY;  Service: Endoscopy;  Laterality: N/A;   ESOPHAGOGASTRODUODENOSCOPY N/A 03/02/2015   Procedure:  ESOPHAGOGASTRODUODENOSCOPY (EGD);  Surgeon: Milus Banister, MD;  Location: [place];  Service: Endoscopy;  Laterality: N/A;   ESOPHAGOGASTRODUODENOSCOPY N/A 02/23/2021   Procedure: ESOPHAGOGASTRODUODENOSCOPY (EGD);  Surgeon: Wilford Corner, MD;  Location: [place];  Service: Endoscopy;  Laterality: N/A;  possible dilation   HERNIA REPAIR     LEFT AND RIGHT HEART CATHETERIZATION WITH CORONARY ANGIOGRAM N/A 02/07/2012   Procedure: LEFT AND RIGHT HEART CATHETERIZATION WITH CORONARY ANGIOGRAM;  Surgeon: Laverda Page, MD;  Location: [facility] CATH LAB;  Service: Cardiovascular;  Laterality: N/A;   LIVER BIOPSY  03/2013   liver mass/medical hx noted above   MUSCLE BIOPSY     for lupus/notes 7/0/9628   UMBILICAL HERNIA REPAIR  1980's     OB History     Gravida  2   Para  1   Term  1   Preterm      AB  1   Living  1      SAB      IAB      Ectopic  1   Multiple      Live Births  1           Family History  Problem Relation Age of Onset   Diabetes Mother    Asthma Mother    Urticaria Mother    Hypertension Mother    Stroke Mother    Ovarian cancer Sister 44       maternal half-sister; RAD51C positive   Asthma Brother    Asthma Brother    Breast cancer Maternal Aunt 60       currently 67   Cancer Maternal Aunt        unk. primary   Cancer Maternal Aunt        unk. primary   Ovarian cancer Maternal Aunt        deceased 67s   Breast cancer Maternal Aunt        deceased 73s   Breast cancer Maternal Aunt    Stomach cancer Maternal Uncle        dx 37s; deceased   Prostate cancer Maternal Uncle        deceased 30s   Breast cancer Cousin        deceased 43s; daughter of mat uncle with stomach ca   Hypertension Other    Stroke Other    Arthritis Other    Allergic rhinitis Neg Hx    Angioedema Neg Hx     Social History   Tobacco Use   Smoking status: Former    Packs/day: 0.50    Years: 3.00    Pack years: 1.50    Types: Cigarettes    Quit  date: 06/02/1991    Years since quitting: 30.1   Smokeless tobacco: Never   Tobacco comments:    quit 28 yrs ago  Vaping Use   Vaping Use: Never used  Substance Use Topics   Alcohol use: No    Alcohol/week:  0.0 standard drinks   Drug use: No    Home Medications Prior to Admission medications   Medication Sig Start Date End Date Taking? Authorizing Provider  azithromycin (ZITHROMAX) 250 MG tablet Take 1 tablet (250 mg total) by mouth daily. Take first 2 tablets together, then 1 every day until finished. 07/26/21  Yes Jaaziah Schulke, DO  ACCU-CHEK AVIVA PLUS test strip USE TO TEST BLOOD SUGAR 4 TIMES DAILY 05/05/21   Renato Shin, MD  ADVAIR [facility] 230-21 MCG/ACT inhaler Inhale 1-2 puffs into the lungs 2 (two) times daily. 12/20/20   [provider]  albuterol (PROVENTIL) (2.5 MG/3ML) 0.083% nebulizer solution Take 2.5 mg by nebulization every 6 (six) hours as needed for wheezing.    [provider]  ALPRAZolam Duanne Moron) 0.5 MG tablet Take 0.5 mg 2 (two) times daily as needed by mouth for anxiety or sleep.  04/19/17   [provider]  amLODipine (NORVASC) 10 MG tablet Take 1 tablet (10 mg total) by mouth daily. 05/27/21   Jose Persia, MD  apixaban (ELIQUIS) 5 MG TABS tablet Take 1 tablet (5 mg total) by mouth 2 (two) times daily. 05/27/21   Jose Persia, MD  ARIPiprazole (ABILIFY) 5 MG tablet Take 5 mg by mouth daily. 12/08/20   [provider]  Azathioprine 75 MG TABS Take 150 mg by mouth 2 (two) times daily. 02/04/21   [provider]  B-D UF III MINI PEN NEEDLES 31G X 5 MM MISC USE AS DIRECTED TWICE A DAY 10/31/18   Renato Shin, MD  Belimumab (BENLYSTA) 200 MG/ML SOAJ Inject 200 mg into the skin once a week. Thursdays 12/19/19   [provider]  Blood Glucose Monitoring Suppl (ONETOUCH VERIO) w/Device KIT Test 4 times daily.  DX E11.9 04/27/20   Elayne Snare, MD  carvedilol (COREG) 25 MG tablet Take 1.5 tablets (37.5 mg total) by mouth 2  (two) times daily with a meal. 01/01/21   Adrian Prows, MD  cromolyn (OPTICROM) 4 % ophthalmic solution Place 1 drop into both eyes 2 (two) times daily. 01/14/21   [provider]  cycloSPORINE (RESTASIS) 0.05 % ophthalmic emulsion Place 1 drop into both eyes in the morning, at noon, and at bedtime.     [provider]  diclofenac Sodium (VOLTAREN) 1 % GEL Apply 2 g topically 4 (four) times daily. 09/18/19   Love, Ivan Anchors, PA-C  DULoxetine (CYMBALTA) 60 MG capsule Take 120 mg by mouth at bedtime. 08/31/20   [provider]  EASY TOUCH PEN NEEDLES 32G X 4 MM MISC USE TO INJECT INSULIN TWICE DAILY 08/07/19   Renato Shin, MD  ENTRESTO 97-103 MG TAKE 1 TABLET BY MOUTH 2 (TWO) TIMES DAILY. Patient taking differently: Take 1 tablet by mouth 2 (two) times daily. 04/09/21   Adrian Prows, MD  famotidine (PEPCID) 20 MG tablet Take 20 mg by mouth 2 (two) times daily.    [provider]  fluticasone (FLONASE) 50 MCG/ACT nasal spray Place 2 sprays into both nostrils continuous as needed for allergies or rhinitis.    [provider]  glucose blood (ACCU-CHEK GUIDE) test strip 1 each by Other route 2 (two) times daily. And lancets 2/day 06/18/21   Renato Shin, MD  hydroxychloroquine (PLAQUENIL) 200 MG tablet Take 200 mg by mouth 2 (two) times daily.    [provider]  Insulin NPH, Human,, Isophane, (HUMULIN N KWIKPEN) 100 UNIT/ML Kiwkpen Inject 260 Units into the skin every morning. And pen needles 1/day 06/02/21  Renato Shin, MD  Insulin Syringe-Needle U-100 (EASY TOUCH INSULIN SYRINGE) 31G X 5/16" 1 ML MISC USE AS DIRECTED THREE TIMES A DAY 10/12/18   Renato Shin, MD  isosorbide-hydrALAZINE (BIDIL) 20-37.5 MG tablet TAKE TWO (2) TABLETS BY MOUTH THREE TIMES DAILY Patient taking differently: Take 2 tablets by mouth 3 (three) times daily. 01/01/21   Adrian Prows, MD  Lancets [facility] ULTRASOFT) lancets Test 4 times daily DX E11.9 04/27/20   Elayne Snare, MD   levothyroxine (SYNTHROID) 50 MCG tablet Take 1 tablet (50 mcg total) by mouth daily at 6 (six) AM. 08/30/19   Sheikh, Georgina Quint Latif, DO  liraglutide (VICTOZA) 18 MG/3ML SOPN Inject 1.8 mg into the skin every morning. 06/28/21   Renato Shin, MD  megestrol (MEGACE) 40 MG tablet Take 40 mg by mouth daily. 01/30/15   [provider]  metFORMIN (GLUCOPHAGE-XR) 500 MG 24 hr tablet Take 2 tablets (1,000 mg total) by mouth 2 (two) times daily. 02/02/21   Renato Shin, MD  Multiple Vitamin (MULTIVITAMIN WITH MINERALS) TABS tablet Take 1 tablet by mouth daily. 08/30/19   Raiford Noble Latif, DO  oxybutynin (DITROPAN XL) 15 MG 24 hr tablet Take 15 mg by mouth daily.  08/30/17   [provider]  predniSONE (DELTASONE) 1 MG tablet Take 2 mg by mouth daily. 04/21/17   [provider]  pregabalin (LYRICA) 75 MG capsule Take 75 mg 3 (three) times daily by mouth.  01/28/15   [provider]  QUEtiapine (SEROQUEL) 300 MG tablet Take 300 mg by mouth at bedtime. 01/08/21   [provider]  rosuvastatin (CRESTOR) 20 MG tablet Take 1 tablet (20 mg total) by mouth daily. 06/09/21 07/09/21  Cantwell, Celeste C, PA-C  scopolamine (TRANSDERM-SCOP) 1 MG/3DAYS Place 1 patch (1.5 mg total) onto the skin every 3 (three) days as needed (nausea). Do not wear for more than 72 hours. 05/27/21   Jose Persia, MD  spironolactone (ALDACTONE) 50 MG tablet Take 1 tablet (50 mg total) by mouth daily. 01/01/21   Adrian Prows, MD  sucralfate (CARAFATE) 1 GM/10ML suspension Take 10 mLs by mouth 2 (two) times daily. 12/09/20   [provider]  topiramate (TOPAMAX) 25 MG tablet Take 75 mg by mouth at bedtime. 03/17/16   [provider]  torsemide (DEMADEX) 20 MG tablet Take 1 tablet (20 mg total) by mouth at bedtime. 06/09/21   Cantwell, Celeste C, PA-C  trimethobenzamide (TIGAN) 300 MG capsule Take 1 capsule (300 mg total) by mouth 3 (three) times daily as needed for nausea/vomiting. Do not take  with the Scopolamine unless nausea is not improving 05/27/21 05/27/22  Jose Persia, MD    Allergies    Metoclopramide, Codeine, Hydrocodone, Penicillins, and Percocet [oxycodone-acetaminophen]  Review of Systems   Review of Systems  Constitutional:  Negative for chills, diaphoresis and fever.  HENT:  Positive for congestion and sinus pain. Negative for ear pain, facial swelling, hearing loss, mouth sores, nosebleeds, postnasal drip, rhinorrhea, sinus pressure, sneezing, sore throat and trouble swallowing.   Eyes:  Negative for pain and visual disturbance.  Respiratory:  Positive for shortness of breath. Negative for cough, hemoptysis, sputum production and wheezing.   Cardiovascular:  Negative for chest pain, palpitations, claudication, syncope and PND.  Gastrointestinal:  Negative for abdominal pain and vomiting.  Genitourinary:  Negative for dysuria and hematuria.  Musculoskeletal:  Negative for arthralgias, back pain and neck pain.  Skin:  Negative for color change and rash.  Neurological:  Negative for seizures, syncope  and headaches.  All other systems reviewed and are negative.  Physical Exam Updated Vital Signs BP (!) 165/96    Pulse 95    Temp 98.3 F (36.8 C) (Oral)    Resp 18    Ht 5' 8"  (1.727 m)    Wt (!) 153 kg    LMP 03/24/2017    SpO2 100%    BMI 51.29 kg/m   Physical Exam Vitals and nursing note reviewed.  Constitutional:      General: She is not in acute distress.    Appearance: She is well-developed.  HENT:     Head: Normocephalic and atraumatic.     Nose: Congestion present.     Mouth/Throat:     Mouth: Mucous membranes are moist.     Pharynx: No pharyngeal swelling or oropharyngeal exudate.  Eyes:     Conjunctiva/sclera: Conjunctivae normal.     Pupils: Pupils are equal, round, and reactive to light.  Cardiovascular:     Rate and Rhythm: Normal rate and regular rhythm.     Pulses: Normal pulses.     Heart sounds: Normal heart sounds. No murmur  heard. Pulmonary:     Effort: Pulmonary effort is normal. No respiratory distress.     Breath sounds: Normal breath sounds. No decreased breath sounds, wheezing or rhonchi.  Abdominal:     Palpations: Abdomen is soft.     Tenderness: There is no abdominal tenderness.  Musculoskeletal:        General: No swelling.     Cervical back: Normal range of motion and neck supple.     Right lower leg: No edema.     Left lower leg: No edema.  Skin:    General: Skin is warm and dry.     Capillary Refill: Capillary refill takes less than 2 seconds.  Neurological:     General: No focal deficit present.     Mental Status: She is alert.  Psychiatric:        Mood and Affect: Mood normal.    ED Results / Procedures / Treatments   Labs (all labs ordered are listed, but only abnormal results are displayed) Labs Reviewed  CBC - Abnormal; Notable for the following components:      Result Value   RBC 3.68 (*)    Hemoglobin 10.6 (*)    HCT 34.4 (*)    RDW 16.4 (*)    All other components within normal limits  COMPREHENSIVE METABOLIC PANEL - Abnormal; Notable for the following components:   Chloride 112 (*)    CO2 15 (*)    Glucose, Bld 365 (*)    AST 14 (*)    All other components within normal limits  RESP PANEL BY RT-PCR (FLU A&B, COVID) ARPGX2  BRAIN NATRIURETIC PEPTIDE  I-STAT BETA HCG BLOOD, ED ([facility], [facility], [facility] ONLY)  TROPONIN I (HIGH SENSITIVITY)    EKG EKG Interpretation  Date/Time:  Monday July 26 2021 03:55:23 EST Ventricular Rate:  112 PR Interval:  150 QRS Duration: 86 QT Interval:  348 QTC Calculation: 475 R Axis:   0 Text Interpretation: Sinus tachycardia Cannot rule out Anterior infarct , age undetermined No significant change since Confirmed by Lennice Sites [phone]) on 07/26/2021 7:29:52 AM  Radiology DG Chest 2 View  Result Date: 07/26/2021 CLINICAL DATA:  Shortness of breath and palpitations. EXAM: CHEST - 2 VIEW COMPARISON:  05/21/2021. FINDINGS: The heart is  mildly enlarged the mediastinal contours are within normal limits. The pulmonary vasculature is mildly  distended. Interstitial prominence is noted bilaterally and there is mild airspace disease at the lung bases. No effusion or pneumothorax. No acute osseous abnormality. IMPRESSION: 1. Cardiomegaly with mildly distended pulmonary vasculature. 2. Interstitial prominence bilaterally with mild airspace disease at the lung bases, possible edema or infiltrate. Electronically Signed   By: Brett Fairy M.D.   On: 07/26/2021 04:34    Procedures Procedures   Medications Ordered in ED Medications - No data to display  ED Course  I have reviewed the triage vital signs and the nursing notes.  Pertinent labs & imaging results that were available during my care of the patient were reviewed by me and considered in my medical decision making (see chart for details).    MDM Rules/Calculators/A&P                          LOVENE MARET is a 50 year old female with history of Sjogren's, hypertension, high cholesterol, heart failure who presents the ED with nasal congestion, shortness of breath.  Patient with unremarkable vitals.  No fever.  Normal room air oxygenation.  No signs of respiratory distress.  Not having any chest pain.  EKG shows sinus rhythm.  No ischemic changes.  Troponin normal.  Doubt ACS.  She is on blood thinner already for PE.  No missed doses.  No concern for PE.  No hypoxia, no significant shortness of breath.  Mostly describes URI symptoms.  Mostly congestion, sinus issues.  BNP is normal and there are no signs of volume overload on exam.  Doubt heart failure.  Chest x-ray overall unremarkable.  No significant anemia or leukocytosis.  Blood sugars in the 300s but not far from her baseline.  Anion gap is normal.  No concern for DKA.  She has not taken her morning insulin as she has been in the waiting room all night.  She prefers to take her insulin at home and will let her do that instead of  trying to make a short-term adjustment now.  We will prescribe her a Z-Pak.  Discharged in good condition.  This chart was dictated using voice recognition software.  Despite best efforts to proofread,  errors can occur which can change the documentation meaning.      Final Clinical Impression(s) / ED Diagnoses Final diagnoses:  Upper respiratory tract infection, unspecified type    Rx / DC Orders ED Discharge Orders          Ordered    azithromycin (ZITHROMAX) 250 MG tablet  Daily        07/26/21 0758             Lennice Sites, DO 07/26/21 [phone]

## 2021-07-26 NOTE — ED Provider Notes (Signed)
Emergency Medicine Provider Triage Evaluation Note  KAREN HUHTA , a 50 y.o. female  was evaluated in triage.  Pt complains of dyspnea, palpitations, and intermittent chest pain x 3 days. No alleviating/aggravating factors. Hx of CHF & prior PE, on eliquis.   Review of Systems  Positive: Dyspnea, chest pain, palpitations Negative: Fever, hemoptysis  Physical Exam  BP (!) 161/94    Pulse (!) 118    Temp 98.3 F (36.8 C) (Oral)    Resp 18    Ht 5\' 8"  (1.727 m)    Wt (!) 153 kg    LMP 03/24/2017    SpO2 100%    BMI 51.29 kg/m  Gen:   Awake, no distress   Resp:  Normal effort  MSK:   Moves extremities without difficulty  Other:  Tachycardic.   Medical Decision Making  Medically screening exam initiated at 3:55 AM.  Appropriate orders placed.  LAQUANNA VEAZEY was informed that the remainder of the evaluation will be completed by another provider, this initial triage assessment does not replace that evaluation, and the importance of remaining in the ED until their evaluation is complete.  Dyspnea   Amaryllis Dyke, PA-C 07/26/21 0402    Orpah Greek, MD 07/26/21 [phone]

## 2021-07-26 NOTE — ED Triage Notes (Signed)
Pt c/o shortness of breath and palpitations. Pt has a history of CHF.

## 2021-07-27 ENCOUNTER — Inpatient Hospital Stay: Payer: Medicaid Other

## 2021-08-04 ENCOUNTER — Inpatient Hospital Stay: Payer: Medicaid Other | Attending: Physician Assistant

## 2021-08-04 ENCOUNTER — Other Ambulatory Visit: Payer: Self-pay

## 2021-08-04 LAB — GENETIC SCREENING ORDER

## 2021-08-17 ENCOUNTER — Ambulatory Visit: Payer: Self-pay | Admitting: Licensed Clinical Social Worker

## 2021-08-17 ENCOUNTER — Telehealth: Payer: Self-pay | Admitting: Licensed Clinical Social Worker

## 2021-08-17 ENCOUNTER — Encounter: Payer: Self-pay | Admitting: Licensed Clinical Social Worker

## 2021-08-17 DIAGNOSIS — Z1379 Encounter for other screening for genetic and chromosomal anomalies: Secondary | ICD-10-CM

## 2021-08-17 NOTE — Telephone Encounter (Signed)
Revealed negative genetic testing.  Revealed that a VUS in BARD1 was identified. This normal result is reassuring.  It is unlikely that there is an increased risk of cancer due to a mutation in one of these genes.  However, genetic testing is not perfect, and cannot definitively rule out a hereditary cause.  It will be important for her to keep in contact with genetics to learn if any additional testing may be needed in the future.

## 2021-08-17 NOTE — Progress Notes (Signed)
HPI:  Ms. Stapleton was previously seen in the [place] clinic due to a family history of cancer and concerns regarding a hereditary predisposition to cancer. Please refer to our prior cancer genetics clinic note for more information regarding our discussion, assessment and recommendations, at the time. Ms. Waszak recent genetic test results were disclosed to her, as were recommendations warranted by these results. These results and recommendations are discussed in more detail below.  CANCER HISTORY:  Oncology History   No history exists.    FAMILY HISTORY:  We obtained a detailed, 4-generation family history.  Significant diagnoses are listed below: Family History  Problem Relation Age of Onset   Diabetes Mother    Asthma Mother    Urticaria Mother    Hypertension Mother    Stroke Mother    Ovarian cancer Sister 35       maternal half-sister; RAD51C positive   Asthma Brother    Asthma Brother    Breast cancer Maternal Aunt 60       currently 25   Cancer Maternal Aunt        unk. primary   Cancer Maternal Aunt        unk. primary   Ovarian cancer Maternal Aunt        deceased 75s   Breast cancer Maternal Aunt        deceased 40s   Breast cancer Maternal Aunt    Stomach cancer Maternal Uncle        dx 21s; deceased   Prostate cancer Maternal Uncle        deceased 65s   Breast cancer Cousin        deceased 97s; daughter of mat uncle with stomach ca   Hypertension Other    Stroke Other    Arthritis Other    Allergic rhinitis Neg Hx    Angioedema Neg Hx    Ms. Perot has 1 son. She has 1 full brother, 2 full sisters, 3 maternal half sisters and 1 maternal half brother. One of her maternal half sisters was diagnosed with ovarian cancer and died at 5. She had genetic testing that reportedly revealed a RAD51C mutation. Patient was tested for this mutation in 2015 with [facility] and tested negative. None of patient's other siblings have had cancer.   Ms.  Lora father died at 95, no cancer. Patient had 8 or 9 paternal aunts/uncles, limited info, no known cancers. No information about paternal grandparents.   Ms. Barsanti mother died at 35, no cancer history. Patient had 8 maternal aunts, 3 maternal uncles. Three aunts have had breast cancer, one died in her 8s, unknown ages for others. Another aunt had ovarian cancer and died in her 72s. An uncle had prostate cancer and died in his 36s, another uncle had stomach cancer and died in his 71s and his daughter had breast cancer and died in her 74s. Maternal grandparents died over 6.   Ms. Rom is unaware of previous family history of genetic testing for hereditary cancer risks. Patient's maternal ancestors are of African American descent, and paternal ancestors are of African American descent. There is no reported Ashkenazi Jewish ancestry. There is no known consanguinity.      GENETIC TEST RESULTS: Genetic testing reported out on 08/17/2021 through the [facility] Multi-Cancer+RNA cancer panel found no pathogenic mutations.   The Multi-Cancer Panel + RNA offered by [facility] includes sequencing and/or deletion duplication testing of the following 84 genes: AIP, ALK, APC, ATM, AXIN2,BAP1,  BARD1, BLM, BMPR1A, BRCA1, BRCA2, BRIP1, CASR, CDC73, CDH1, CDK4, CDKN1B, CDKN1C, CDKN2A (p14ARF), CDKN2A (p16INK4a), CEBPA, CHEK2, CTNNA1, DICER1, DIS3L2, EGFR (c.2369C>T, p.Thr790Met variant only), EPCAM (Deletion/duplication testing only), FH, FLCN, GATA2, GPC3, GREM1 (Promoter region deletion/duplication testing only), HOXB13 (c.251G>A, p.Gly84Glu), HRAS, KIT, MAX, MEN1, MET, MITF (c.952G>A, p.Glu318Lys variant only), MLH1, MSH2, MSH3, MSH6, MUTYH, NBN, NF1, NF2, NTHL1, PALB2, PDGFRA, PHOX2B, PMS2, POLD1, POLE, POT1, PRKAR1A, PTCH1, PTEN, RAD50, RAD51C, RAD51D, RB1, RECQL4, RET, RUNX1, SDHAF2, SDHA (sequence changes only), SDHB, SDHC, SDHD, SMAD4, SMARCA4, SMARCB1, SMARCE1, STK11, SUFU, TERC, TERT, TMEM127, TP53, TSC1, TSC2,  VHL, WRN and WT1.   The test report has been scanned into EPIC and is located under the Molecular Pathology section of the Results Review tab.  A portion of the result report is included below for reference.     We discussed that because current genetic testing is not perfect, it is possible there may be a gene mutation in one of these genes that current testing cannot detect, but that chance is small.  There could be another gene that has not yet been discovered, or that we have not yet tested, that is responsible for the cancer diagnoses in the family. It is also possible there is a hereditary cause for the cancer in the family that Ms. Schnelle did not inherit and therefore was not identified in her testing.  Therefore, it is important to remain in touch with cancer genetics in the future so that we can continue to offer Ms. Simoneaux the most up to date genetic testing.   Genetic testing did identify a variant of uncertain significance (VUS) in the BARD1 gene called c.842C>T.  At this time, it is unknown if this variant is associated with increased cancer risk or if this is a normal finding, but most variants such as this get reclassified to being inconsequential. It should not be used to make medical management decisions. With time, we suspect the lab will determine the significance of this variant, if any. If we do learn more about it we will try to contact Ms. Poer to discuss it further. However, it is important to stay in touch with [place] periodically and keep the address and phone number up to date.  ADDITIONAL GENETIC TESTING: We discussed with Ms. Burdine that her genetic testing was fairly extensive.  If there are genes identified to increase cancer risk that can be analyzed in the future, we would be happy to discuss and coordinate this testing at that time.    CANCER SCREENING RECOMMENDATIONS: Ms. Nicastro test result is considered negative (normal).  This means that we have not identified a hereditary  cause for her family history of cancer at this time.   While reassuring, this does not definitively rule out a hereditary predisposition to cancer. It is still possible that there could be genetic mutations that are undetectable by current technology. There could be genetic mutations in genes that have not been tested or identified to increase cancer risk.  Therefore, it is recommended she continue to follow the cancer management and screening guidelines provided by her primary healthcare provider.   An individual's cancer risk and medical management are not determined by genetic test results alone. Overall cancer risk assessment incorporates additional factors, including personal medical history, family history, and any available genetic information that may result in a personalized plan for cancer prevention and surveillance.  Based on Ms. Ruble's personal and family history of cancer as well as her genetic test results, risk model  Harriett Rush was used to estimate her risk of developing breast cancer. This estimates her lifetime risk of developing breast cancer to be approximately 13.8%.  The patient's lifetime breast cancer risk is a preliminary estimate based on available information using one of several models endorsed by the [employer]  (NCCN). The NCCN recommends consideration of breast MRI screening as an adjunct to mammography for patients at high risk (defined as 20% or greater lifetime risk).  This risk estimate can change over time and may be repeated to reflect new information in her personal or family history in the future.    RECOMMENDATIONS FOR FAMILY MEMBERS:  Relatives in this family might be at some increased risk of developing cancer, over the general population risk, simply due to the family history of cancer.  We recommended female relatives in this family have a yearly mammogram beginning at age 58, or 45 years younger than the earliest onset of cancer, an  annual clinical breast exam, and perform monthly breast self-exams. Female relatives in this family should also have a gynecological exam as recommended by their primary provider.  All family members should be referred for colonoscopy starting at age 72.   Based on Ms. Polack's family history, we recommended her maternal relatives have genetic counseling and testing for the known family variant in RAD51C and potentially a broader panel. Ms. Filosa will let us know if we can be of any assistance in coordinating genetic counseling and/or testing for these family members.  FOLLOW-UP: Lastly, we discussed with Ms. Marsalis that cancer genetics is a rapidly advancing field and it is possible that new genetic tests will be appropriate for her and/or her family members in the future. We encouraged her to remain in contact with cancer genetics on an annual basis so we can update her personal and family histories and let her know of advances in cancer genetics that may benefit this family.   Our contact number was provided. Ms. Worster questions were answered to her satisfaction, and she knows she is welcome to call us at anytime with additional questions or concerns.   Faith Rogue, MS, [facility] Genetic Counselor [email or web address] Phone: [phone]

## 2021-08-23 ENCOUNTER — Ambulatory Visit: Payer: Medicaid Other | Admitting: Endocrinology

## 2021-09-08 ENCOUNTER — Ambulatory Visit (INDEPENDENT_AMBULATORY_CARE_PROVIDER_SITE_OTHER): Payer: Medicaid Other | Admitting: Endocrinology

## 2021-09-08 ENCOUNTER — Other Ambulatory Visit: Payer: Self-pay

## 2021-09-08 VITALS — BP 100/60 | HR 103 | Ht 68.0 in | Wt 337.6 lb

## 2021-09-08 DIAGNOSIS — E785 Hyperlipidemia, unspecified: Secondary | ICD-10-CM

## 2021-09-08 DIAGNOSIS — E1169 Type 2 diabetes mellitus with other specified complication: Secondary | ICD-10-CM | POA: Diagnosis not present

## 2021-09-08 LAB — POCT GLYCOSYLATED HEMOGLOBIN (HGB A1C): Hemoglobin A1C: 8.3 % — AB (ref 4.0–5.6)

## 2021-09-08 MED ORDER — DAPAGLIFLOZIN PROPANEDIOL 10 MG PO TABS: 10.0000 mg | ORAL_TABLET | Freq: Every day | ORAL | 3 refills | Status: DC

## 2021-09-08 NOTE — Progress Notes (Signed)
Subjective:    Patient ID: [name], female    DOB: 09/05/1970, 51 y.o.   MRN: [id number]  HPI Pt returns for f/u of diabetes mellitus:  DM type: Insulin-requiring type 2 Dx'ed: 0034 Complications: polyneuropathy and gastroparesis.  Therapy: insulin (since 2014), Victoza, and metformin.   GDM: never.  DKA: never.  Severe hypoglycemia: never.  Pancreatitis: never.  Other: she is too ill to undergo weight-loss surgery; she takes qd insulin, after poor results with multiple daily injections; she takes NPH, due to pattern of cbg's. Medicaid approved Trulicity, but it is not available.  Interval history: no cbg record, but states cbg's vary from 77-215.  Pt says she takes insulin as rx'ed.   Past Medical History:  Diagnosis Date   Acute renal failure (HCC)     Intractable nausea vomiting secondary to diabetic gastroparesis causing dehydration and acute renal failure /notes 04/01/2013   Anginal pain ([place])    Anxiety    Bell's palsy 08/09/2010   Bicornuate uterus    CAP (community acquired pneumonia) 10/2011   Archie Endo 11/08/2011   Chest pain 01/13/2014   CHF (congestive heart failure) ([place])    Diabetic gastroparesis associated with type 2 diabetes mellitus ([place])    this is presumed diagnoses, not confirmed by any studies.    Eczema    Family history of breast cancer    Family history of malignant neoplasm of breast    Family history of malignant neoplasm of ovary    Family history of ovarian cancer    Family history of prostate cancer    GERD (gastroesophageal reflux disease)    High cholesterol    Hypertension    Liver lesion 08/13/2019   Liver mass 2014   biopsied 03/2013 at [facility], not malignant.  is to undergo radiologic ablation of the mass in sept/October 2014.    Lupus ([place])    Migraines    "maybe a couple times/yr" (04/01/2013)   Obesity    Obstructive sleep apnea on CPAP 2011   Oxygen at nights   SJOGREN'S SYNDROME 08/09/2010   SLE (systemic lupus  erythematosus) ([place])    Archie Endo 12/01/2010    Past Surgical History:  Procedure Laterality Date   BREAST CYST EXCISION Left 08/2005   epidermoid   CARDIAC CATHETERIZATION  02/07/2012   ECTOPIC PREGNANCY SURGERY  1999   ESOPHAGOGASTRODUODENOSCOPY N/A 02/26/2013   Procedure: ESOPHAGOGASTRODUODENOSCOPY (EGD);  Surgeon: Irene Shipper, MD;  Location: [facility] ENDOSCOPY;  Service: Endoscopy;  Laterality: N/A;   ESOPHAGOGASTRODUODENOSCOPY N/A 03/02/2015   Procedure: ESOPHAGOGASTRODUODENOSCOPY (EGD);  Surgeon: Milus Banister, MD;  Location: [place];  Service: Endoscopy;  Laterality: N/A;   ESOPHAGOGASTRODUODENOSCOPY N/A 02/23/2021   Procedure: ESOPHAGOGASTRODUODENOSCOPY (EGD);  Surgeon: Wilford Corner, MD;  Location: [place];  Service: Endoscopy;  Laterality: N/A;  possible dilation   HERNIA REPAIR     LEFT AND RIGHT HEART CATHETERIZATION WITH CORONARY ANGIOGRAM N/A 02/07/2012   Procedure: LEFT AND RIGHT HEART CATHETERIZATION WITH CORONARY ANGIOGRAM;  Surgeon: Laverda Page, MD;  Location: [facility] CATH LAB;  Service: Cardiovascular;  Laterality: N/A;   LIVER BIOPSY  03/2013   liver mass/medical hx noted above   MUSCLE BIOPSY     for lupus/notes 04/01/7914   UMBILICAL HERNIA REPAIR  1980's    Social History   Socioeconomic History   Marital status: Single    Spouse name: Not on file   Number of children: 1   Years of education: Not on file   Highest  education level: Not on file  Occupational History    Employer: UNEMPLOYED    Comment: student  Tobacco Use   Smoking status: Former    Packs/day: 0.50    Years: 3.00    Pack years: 1.50    Types: Cigarettes    Quit date: 06/02/1991    Years since quitting: 30.2   Smokeless tobacco: Never   Tobacco comments:    quit 28 yrs ago  Vaping Use   Vaping Use: Never used  Substance and Sexual Activity   Alcohol use: No    Alcohol/week: 0.0 standard drinks   Drug use: No   Sexual activity: Yes    Partners: Male    Birth  control/protection: None  Other Topics Concern   Not on file  Social History Narrative   Regular exercise-yes   Social Determinants of Health   Financial Resource Strain: Not on file  Food Insecurity: Not on file  Transportation Needs: Not on file  Physical Activity: Not on file  Stress: Not on file  Social Connections: Not on file  Intimate Partner Violence: Not on file    Current Outpatient Medications on File Prior to Visit  Medication Sig Dispense Refill   ACCU-CHEK AVIVA PLUS test strip USE TO TEST BLOOD SUGAR 4 TIMES DAILY 100 strip 2   ADVAIR HFA 230-21 MCG/ACT inhaler Inhale 1-2 puffs into the lungs 2 (two) times daily.     albuterol (PROVENTIL) (2.5 MG/3ML) 0.083% nebulizer solution Take 2.5 mg by nebulization every 6 (six) hours as needed for wheezing.     ALPRAZolam (XANAX) 0.5 MG tablet Take 0.5 mg 2 (two) times daily as needed by mouth for anxiety or sleep.      amLODipine (NORVASC) 10 MG tablet Take 1 tablet (10 mg total) by mouth daily. 30 tablet 0   apixaban (ELIQUIS) 5 MG TABS tablet Take 1 tablet (5 mg total) by mouth 2 (two) times daily. 60 tablet 0   ARIPiprazole (ABILIFY) 5 MG tablet Take 5 mg by mouth daily.     Azathioprine 75 MG TABS Take 150 mg by mouth 2 (two) times daily.     azithromycin (ZITHROMAX) 250 MG tablet Take 1 tablet (250 mg total) by mouth daily. Take first 2 tablets together, then 1 every day until finished. 6 tablet 0   B-D UF III MINI PEN NEEDLES 31G X 5 MM MISC USE AS DIRECTED TWICE A DAY 100 each 2   Belimumab (BENLYSTA) 200 MG/ML SOAJ Inject 200 mg into the skin once a week. Thursdays     Blood Glucose Monitoring Suppl (ONETOUCH VERIO) w/Device KIT Test 4 times daily.  DX E11.9 1 kit 1   carvedilol (COREG) 25 MG tablet Take 1.5 tablets (37.5 mg total) by mouth 2 (two) times daily with a meal. 270 tablet 3   cromolyn (OPTICROM) 4 % ophthalmic solution Place 1 drop into both eyes 2 (two) times daily.     cycloSPORINE (RESTASIS) 0.05 %  ophthalmic emulsion Place 1 drop into both eyes in the morning, at noon, and at bedtime.      diclofenac Sodium (VOLTAREN) 1 % GEL Apply 2 g topically 4 (four) times daily. 350 g 0   DULoxetine (CYMBALTA) 60 MG capsule Take 120 mg by mouth at bedtime.     EASY TOUCH PEN NEEDLES 32G X 4 MM MISC USE TO INJECT INSULIN TWICE DAILY 100 each 9   ENTRESTO 97-103 MG TAKE 1 TABLET BY MOUTH 2 (TWO) TIMES DAILY. (Patient taking  differently: Take 1 tablet by mouth 2 (two) times daily.) 180 tablet 3   famotidine (PEPCID) 20 MG tablet Take 20 mg by mouth 2 (two) times daily.     fluticasone (FLONASE) 50 MCG/ACT nasal spray Place 2 sprays into both nostrils continuous as needed for allergies or rhinitis.     glucose blood (ACCU-CHEK GUIDE) test strip 1 each by Other route 2 (two) times daily. And lancets 2/day 200 each 3   hydroxychloroquine (PLAQUENIL) 200 MG tablet Take 200 mg by mouth 2 (two) times daily.     Insulin NPH, Human,, Isophane, (HUMULIN N KWIKPEN) 100 UNIT/ML Kiwkpen Inject 260 Units into the skin every morning. And pen needles 1/day 270 mL 3   Insulin Syringe-Needle U-100 (EASY TOUCH INSULIN SYRINGE) 31G X 5/16" 1 ML MISC USE AS DIRECTED THREE TIMES A DAY 100 each 10   isosorbide-hydrALAZINE (BIDIL) 20-37.5 MG tablet TAKE TWO (2) TABLETS BY MOUTH THREE TIMES DAILY (Patient taking differently: Take 2 tablets by mouth 3 (three) times daily.) 270 tablet 3   Lancets (ONETOUCH ULTRASOFT) lancets Test 4 times daily DX E11.9 100 each 12   levothyroxine (SYNTHROID) 50 MCG tablet Take 1 tablet (50 mcg total) by mouth daily at 6 (six) AM.     liraglutide (VICTOZA) 18 MG/3ML SOPN Inject 1.8 mg into the skin every morning. 9 mL 3   megestrol (MEGACE) 40 MG tablet Take 40 mg by mouth daily.  3   metFORMIN (GLUCOPHAGE-XR) 500 MG 24 hr tablet Take 2 tablets (1,000 mg total) by mouth 2 (two) times daily. 180 tablet 3   Multiple Vitamin (MULTIVITAMIN WITH MINERALS) TABS tablet Take 1 tablet by mouth daily.      oxybutynin (DITROPAN XL) 15 MG 24 hr tablet Take 15 mg by mouth daily.      pregabalin (LYRICA) 75 MG capsule Take 75 mg 3 (three) times daily by mouth.      QUEtiapine (SEROQUEL) 300 MG tablet Take 300 mg by mouth at bedtime.     scopolamine (TRANSDERM-SCOP) 1 MG/3DAYS Place 1 patch (1.5 mg total) onto the skin every 3 (three) days as needed (nausea). Do not wear for more than 72 hours. 10 patch 0   spironolactone (ALDACTONE) 50 MG tablet Take 1 tablet (50 mg total) by mouth daily. 90 tablet 3   sucralfate (CARAFATE) 1 GM/10ML suspension Take 10 mLs by mouth 2 (two) times daily.     topiramate (TOPAMAX) 25 MG tablet Take 75 mg by mouth at bedtime.     torsemide (DEMADEX) 20 MG tablet Take 1 tablet (20 mg total) by mouth at bedtime. 90 tablet 3   trimethobenzamide (TIGAN) 300 MG capsule Take 1 capsule (300 mg total) by mouth 3 (three) times daily as needed for nausea/vomiting. Do not take with the Scopolamine unless nausea is not improving 10 capsule 0   rosuvastatin (CRESTOR) 20 MG tablet Take 1 tablet (20 mg total) by mouth daily. 90 tablet 3   No current facility-administered medications on file prior to visit.    Allergies  Allergen Reactions   Metoclopramide Other (See Comments)    Developed restless leg, akathisia type limb movements.    Codeine Itching, Rash and Other (See Comments)   Hydrocodone Rash   Penicillins Itching    Tolerated Unasyn 02/01/2020   Percocet [Oxycodone-Acetaminophen] Hives and Rash    Tolerates dilaudid Tolerates acetaminophen     Family History  Problem Relation Age of Onset   Diabetes Mother    Asthma Mother  Urticaria Mother    Hypertension Mother    Stroke Mother    Ovarian cancer Sister 60       maternal half-sister; RAD51C positive   Asthma Brother    Asthma Brother    Breast cancer Maternal Aunt 79       currently 29   Cancer Maternal Aunt        unk. primary   Cancer Maternal Aunt        unk. primary   Ovarian cancer Maternal Aunt         deceased 97s   Breast cancer Maternal Aunt        deceased 80s   Breast cancer Maternal Aunt    Stomach cancer Maternal Uncle        dx 14s; deceased   Prostate cancer Maternal Uncle        deceased 45s   Breast cancer Cousin        deceased 85s; daughter of mat uncle with stomach ca   Hypertension Other    Stroke Other    Arthritis Other    Allergic rhinitis Neg Hx    Angioedema Neg Hx     BP 100/60    Pulse (!) 103    Ht 5' 8" (1.727 m)    Wt (!) 337 lb 9.6 oz (153.1 kg)    LMP 03/24/2017    SpO2 98%    BMI 51.33 kg/m    Review of Systems She denies hypoglycemia    Objective:   Physical Exam    Lab Results  Component Value Date   HGBA1C 8.3 (A) 09/08/2021   Lab Results  Component Value Date   CREATININE 0.87 07/26/2021   BUN 13 07/26/2021   NA 139 07/26/2021   K 3.6 07/26/2021   CL 112 (H) 07/26/2021   CO2 15 (L) 07/26/2021      Assessment & Plan:  Insulin-requiring type 2 DM: uncontrolled.  Low CO2: recheck today  Patient Instructions  check your blood sugar 4 times a day: before the 3 meals, and at bedtime.  also check if you have symptoms of your blood sugar being too high or too low.  please keep a record of the readings and bring it to your next appointment here (or you can bring the meter itself).  You can write it on any piece of paper.  please call us sooner if your blood sugar goes below 70, or if you have a lot of readings over 200, especially if you have to increase the prednisone again.   Blood tests are requested for you today.  We'll let you know about the results.  I have sent a prescription to your pharmacy, to add Wilder Glade Please continue the same other medications On this type of insulin schedule, you should eat meals on a regular schedule (especially lunch).  If a meal is missed or significantly delayed, your blood sugar could go low.   Please come back for a follow-up appointment in 2 months.

## 2021-09-08 NOTE — Patient Instructions (Addendum)
check your blood sugar 4 times a day: before the 3 meals, and at bedtime.  also check if you have symptoms of your blood sugar being too high or too low.  please keep a record of the readings and bring it to your next appointment here (or you can bring the meter itself).  You can write it on any piece of paper.  please call us sooner if your blood sugar goes below 70, or if you have a lot of readings over 200, especially if you have to increase the prednisone again.   Blood tests are requested for you today.  We'll let you know about the results.  I have sent a prescription to your pharmacy, to add Wilder Glade Please continue the same other medications On this type of insulin schedule, you should eat meals on a regular schedule (especially lunch).  If a meal is missed or significantly delayed, your blood sugar could go low.   Please come back for a follow-up appointment in 2 months.

## 2021-09-09 ENCOUNTER — Ambulatory Visit: Payer: Medicaid Other

## 2021-09-09 DIAGNOSIS — I5042 Chronic combined systolic (congestive) and diastolic (congestive) heart failure: Secondary | ICD-10-CM

## 2021-09-09 LAB — BASIC METABOLIC PANEL
BUN: 19 mg/dL (ref 6–23)
CO2: 23 mEq/L (ref 19–32)
Calcium: 9.3 mg/dL (ref 8.4–10.5)
Chloride: 111 mEq/L (ref 96–112)
Creatinine, Ser: 1.17 mg/dL (ref 0.40–1.20)
GFR: 54.21 mL/min — ABNORMAL LOW (ref >60.00)
Glucose, Bld: 194 mg/dL — ABNORMAL HIGH (ref 70–99)
Potassium: 4.1 mEq/L (ref 3.5–5.1)
Sodium: 143 mEq/L (ref 135–145)

## 2021-09-16 ENCOUNTER — Ambulatory Visit: Payer: Medicaid Other | Admitting: Student

## 2021-09-20 ENCOUNTER — Ambulatory Visit: Payer: Medicaid Other | Admitting: Student

## 2021-09-20 ENCOUNTER — Other Ambulatory Visit: Payer: Self-pay

## 2021-09-20 ENCOUNTER — Encounter: Payer: Self-pay | Admitting: Student

## 2021-09-20 VITALS — BP 128/73 | HR 95 | Temp 97.7°F | Resp 16 | Ht 68.0 in | Wt 334.0 lb

## 2021-09-20 DIAGNOSIS — I5042 Chronic combined systolic (congestive) and diastolic (congestive) heart failure: Secondary | ICD-10-CM

## 2021-09-20 DIAGNOSIS — I10 Essential (primary) hypertension: Secondary | ICD-10-CM

## 2021-09-20 DIAGNOSIS — E785 Hyperlipidemia, unspecified: Secondary | ICD-10-CM

## 2021-09-20 MED ORDER — DICLOFENAC SODIUM 1 % EX GEL: 2.0000 g | Freq: Four times a day (QID) | CUTANEOUS | 1 refills | Status: DC

## 2021-09-20 NOTE — Progress Notes (Signed)
Primary Physician:  Bartholome Bill, MD   Patient ID: [name], female    DOB: 1971/03/05, 51 y.o.   MRN: [id number]  CC:  Chief Complaint  Patient presents with   Chronic combined systolic and diastolic heart failure    Follow-up   Subjective:   HPI: [name] is a 50 y.o. female  with Sjogren's syndrome, SLE, chronic systolic heart failure now with improved EF to 45-50% on echo in Nov 2019, morbid obesity, obesity hypoventilation syndrome, uncontrolled type 2 DM, hypertension, h/o CVA in 2014, OSA on CPAP and hepatic adenoma s/p embolization on 08/12/19.  Patient has history of COVID-19 infection requiring hospitalization in 12/2019.  Patient was admitted 05/21/2021 - 05/27/2021 with submassive PE with right heart strain and further reduction of LVEF to 30-35% from 45-50% in 12/2020.  Patient has then been on guideline directed medical therapy for heart failure as well as anticoagulation and repeat echocardiogram showed normalization of LVEF.  Presents for 72-monthfollow-up.  Patient states overall she is feeling well, her primary concern today is that endocrinology has advised her to start FIranand she wanted to verify that it is okay with cardiology perspective.  Denies orthopnea, PND.  She continues to take torsemide 20 mg once daily with additional doses as needed.  Notably she has upcoming appointment with PCP at which time she will have lipid profile testing done.  Past Medical History:  Diagnosis Date   Acute renal failure (HCC)     Intractable nausea vomiting secondary to diabetic gastroparesis causing dehydration and acute renal failure /notes 04/01/2013   Anginal pain ([facility]    Anxiety    Bell's palsy 08/09/2010   Bicornuate uterus    CAP (community acquired pneumonia) 10/2011   /[facility]4/04/2012   Chest pain 01/13/2014   CHF (congestive heart failure) ([facility]    Diabetic gastroparesis associated with type 2 diabetes mellitus ([facility] [place]    this is presumed diagnoses,  not confirmed by any studies.    Eczema    Family history of breast cancer    Family history of malignant neoplasm of breast    Family history of malignant neoplasm of ovary    Family history of ovarian cancer    Family history of prostate cancer    GERD (gastroesophageal reflux disease)    High cholesterol    Hypertension    Liver lesion 08/13/2019   Liver mass 2014   [facility].  is to undergo radiologic ablation of the mass in sept/October 2014.    Lupus ([facility] [place]    Migraines    "maybe a couple times/yr" (04/01/2013)   Obesity    Obstructive sleep apnea on CPAP 2011   Oxygen at nights   SJOGREN'S SYNDROME 08/09/2010   SLE (systemic lupus erythematosus) ([facility]    /[facility]5/09/2010   Past Surgical History:  Procedure Laterality Date   BREAST CYST EXCISION Left 08/2005   epidermoid   CARDIAC CATHETERIZATION  02/07/2012   ECTOPIC PREGNANCY SURGERY  1999   ESOPHAGOGASTRODUODENOSCOPY N/A 02/26/2013   Procedure: ESOPHAGOGASTRODUODENOSCOPY (EGD);  Surgeon: JIrene Shipper MD;  [facility]  Service: Endoscopy;  Laterality: N/A;   ESOPHAGOGASTRODUODENOSCOPY N/A 03/02/2015   Procedure: ESOPHAGOGASTRODUODENOSCOPY (EGD);  Surgeon: DMilus Banister MD;  [place] [facility]  Service: Endoscopy;  Laterality: N/A;   ESOPHAGOGASTRODUODENOSCOPY N/A 02/23/2021   Procedure: ESOPHAGOGASTRODUODENOSCOPY (EGD);  Surgeon: SWilford Corner MD;  [place] [facility]  Service: Endoscopy;  Laterality: N/A;  possible  dilation   HERNIA REPAIR     LEFT AND RIGHT HEART CATHETERIZATION WITH CORONARY ANGIOGRAM N/A 02/07/2012   Procedure: LEFT AND RIGHT HEART CATHETERIZATION WITH CORONARY ANGIOGRAM;  Surgeon: Laverda Page, MD;  [facility];  Service: Cardiovascular;  Laterality: N/A;   LIVER BIOPSY  03/2013   liver mass/medical hx noted above   MUSCLE BIOPSY     for lupus/notes 04/03/7168   UMBILICAL HERNIA REPAIR  50's   Family History   Problem Relation Age of Onset   Diabetes Mother    Asthma Mother    Urticaria Mother    Hypertension Mother    Stroke Mother    Ovarian cancer Sister 56       maternal half-sister; RAD51C positive   Asthma Brother    Asthma Brother    Breast cancer Maternal Aunt 60       currently 28   Cancer Maternal Aunt        unk. primary   Cancer Maternal Aunt        unk. primary   Ovarian cancer Maternal Aunt        deceased 27s   Breast cancer Maternal Aunt        deceased 37s   Breast cancer Maternal Aunt    Stomach cancer Maternal Uncle        dx 49s; deceased   Prostate cancer Maternal Uncle        deceased 27s   Breast cancer Cousin        deceased 65s; daughter of mat uncle with stomach ca   Hypertension Other    Stroke Other    Arthritis Other    Allergic rhinitis Neg Hx    Angioedema Neg Hx    Social History   Tobacco Use   Smoking status: Former    Packs/day: 0.50    Years: 3.00    Pack years: 1.50    Types: Cigarettes    Quit date: 06/02/1991    Years since quitting: 30.3   Smokeless tobacco: Never   Tobacco comments:    quit 28 yrs ago  Substance Use Topics   Alcohol use: No    Alcohol/week: 0.0 standard drinks   Marital Status: Single   ROS   Review of Systems  Cardiovascular:  Negative for chest pain, dyspnea on exertion (improved), leg swelling, orthopnea, palpitations and paroxysmal nocturnal dyspnea.  Gastrointestinal:  Negative for melena.   Objective:  Blood pressure 128/73, pulse 95, temperature 97.7 F (36.5 C), temperature source Temporal, resp. rate 16, height _0  (1.727 m), weight (!) 334 lb (151.5 kg), last menstrual period 03/24/2017, SpO2 99 %. Body mass index is 50.78 kg/m.   Vitals with BMI 09/20/2021 09/08/2021 07/26/2021  Height _1  _2  -  Weight 334 lbs 337 lbs 10 oz -  BMI 67.8 93.81 -  Systolic 017 510 258  Diastolic 73 60 96  Pulse 95 103 95   Physical Exam Vitals reviewed.  Constitutional:      Appearance: She is  well-developed.     Comments: Morbidly obese  Neck:     Vascular: No JVD.     Comments: Short neck and difficult to evaluate JVP Cardiovascular:     Rate and Rhythm: Normal rate and regular rhythm.     Pulses: Intact distal pulses.          Carotid pulses are 2+ on the right side and 2+ on the left side.  Dorsalis pedis pulses are 2+ on the right side and 2+ on the left side.       Posterior tibial pulses are 2+ on the right side and 2+ on the left side.     Heart sounds: Normal heart sounds. No murmur heard.   No gallop.     Comments: Femoral and popliteal pulse difficult to feel due to patient's body habitus.  Pulmonary:     Effort: Pulmonary effort is normal.     Breath sounds: Normal breath sounds.  Abdominal:     Comments: Obese. Pannus present  Musculoskeletal:     Right lower leg: No edema.     Left lower leg: No edema.   Laboratory examination:   CMP Latest Ref Rng & Units 09/08/2021 07/26/2021 06/22/2021  Glucose 70 - 99 mg/dL 194(H) 365(H) 275(H)  BUN 6 - 23 mg/dL _0 Creatinine 0.40 - 1.20 mg/dL 1.17 0.87 0.99  Sodium 135 - 145 mEq/L 143 139 141  Potassium 3.5 - 5.1 mEq/L 4.1 3.6 4.3  Chloride 96 - 112 mEq/L 111 112(H) 113(H)  CO2 19 - 32 mEq/L 23 15(L) 20(L)  Calcium 8.4 - 10.5 mg/dL 9.3 8.9 9.4  Total Protein 6.5 - 8.1 g/dL - 6.7 6.9  Total Bilirubin 0.3 - 1.2 mg/dL - 0.4 0.2(L)  Alkaline Phos 38 - 126 U/L - 114 90  AST 15 - 41 U/L - 14(L) 10(L)  ALT 0 - 44 U/L - 15 6   CBC Latest Ref Rng & Units 07/26/2021 06/22/2021 05/26/2021  WBC 4.0 - 10.5 K/uL 10.2 9.2 9.9  Hemoglobin 12.0 - 15.0 g/dL 10.6(L) 10.0(L) 11.5(L)  Hematocrit 36.0 - 46.0 % 34.4(L) 31.8(L) 36.2  Platelets 150 - 400 K/uL 291 331 232   Lipid Panel     Component Value Date/Time   CHOL 203 (H) 05/22/2021 1432   TRIG 138 05/22/2021 1432   HDL 58 05/22/2021 1432   CHOLHDL 3.5 05/22/2021 1432   VLDL 28 05/22/2021 1432   LDLCALC 117 (H) 05/22/2021 1432     HEMOGLOBIN A1C Lab  Results  Component Value Date   HGBA1C 8.3 (A) 09/08/2021   MPG 323.53 05/22/2021   TSH Recent Labs    05/21/21 0208  TSH 2.871     BNP (last 3 results) Recent Labs    05/21/21 0208 07/26/21 0400  BNP 17.9 32.9    ProBNP (last 3 results) No results for input(s): PROBNP in the last 8760 hours.   Allergies   Allergies  Allergen Reactions   Metoclopramide Other (See Comments)    Developed restless leg, akathisia type limb movements.    Codeine Itching, Rash and Other (See Comments)   Hydrocodone Rash   Penicillins Itching    Tolerated Unasyn 02/01/2020   Percocet [Oxycodone-Acetaminophen] Hives and Rash    Tolerates dilaudid Tolerates acetaminophen     Medications Prior to Visit:   Outpatient Medications Prior to Visit  Medication Sig Dispense Refill   ACCU-CHEK AVIVA PLUS test strip USE TO TEST BLOOD SUGAR 4 TIMES DAILY 100 strip 2   ADVAIR HFA 230-21 MCG/ACT inhaler Inhale 1-2 puffs into the lungs 2 (two) times daily.     albuterol (PROVENTIL) (2.5 MG/3ML) 0.083% nebulizer solution Take 2.5 mg by nebulization every 6 (six) hours as needed for wheezing.     ALPRAZolam (XANAX) 0.5 MG tablet Take 0.5 mg 2 (two) times daily as needed by mouth for anxiety or sleep.      amLODipine (NORVASC) 10 MG  tablet Take 1 tablet (10 mg total) by mouth daily. 30 tablet 0   apixaban (ELIQUIS) 5 MG TABS tablet Take 1 tablet (5 mg total) by mouth 2 (two) times daily. 60 tablet 0   ARIPiprazole (ABILIFY) 5 MG tablet Take 5 mg by mouth daily.     Azathioprine 75 MG TABS Take 150 mg by mouth 2 (two) times daily.     B-D UF III MINI PEN NEEDLES 31G X 5 MM MISC USE AS DIRECTED TWICE A DAY 100 each 2   Belimumab (BENLYSTA) 200 MG/ML SOAJ Inject 200 mg into the skin once a week. Thursdays     Blood Glucose Monitoring Suppl (ONETOUCH VERIO) w/Device KIT Test 4 times daily.  DX E11.9 1 kit 1   carvedilol (COREG) 25 MG tablet Take 1.5 tablets (37.5 mg total) by mouth 2 (two) times daily with a  meal. 270 tablet 3   cromolyn (OPTICROM) 4 % ophthalmic solution Place 1 drop into both eyes 2 (two) times daily.     cycloSPORINE (RESTASIS) 0.05 % ophthalmic emulsion Place 1 drop into both eyes in the morning, at noon, and at bedtime.      dapagliflozin propanediol (FARXIGA) 10 MG TABS tablet Take 1 tablet (10 mg total) by mouth daily before breakfast. 90 tablet 3   DULoxetine (CYMBALTA) 60 MG capsule Take 120 mg by mouth at bedtime.     EASY TOUCH PEN NEEDLES 32G X 4 MM MISC USE TO INJECT INSULIN TWICE DAILY 100 each 9   ENTRESTO 97-103 MG TAKE 1 TABLET BY MOUTH 2 (TWO) TIMES DAILY. (Patient taking differently: Take 1 tablet by mouth 2 (two) times daily.) 180 tablet 3   famotidine (PEPCID) 20 MG tablet Take 20 mg by mouth 2 (two) times daily.     fluticasone (FLONASE) 50 MCG/ACT nasal spray Place 2 sprays into both nostrils continuous as needed for allergies or rhinitis.     glucose blood (ACCU-CHEK GUIDE) test strip 1 each by Other route 2 (two) times daily. And lancets 2/day 200 each 3   hydroxychloroquine (PLAQUENIL) 200 MG tablet Take 200 mg by mouth 2 (two) times daily.     Insulin NPH, Human,, Isophane, (HUMULIN N KWIKPEN) 100 UNIT/ML Kiwkpen Inject 260 Units into the skin every morning. And pen needles 1/day 270 mL 3   Insulin Syringe-Needle U-100 (EASY TOUCH INSULIN SYRINGE) 31G X 5/16" 1 ML MISC USE AS DIRECTED THREE TIMES A DAY 100 each 10   isosorbide-hydrALAZINE (BIDIL) 20-37.5 MG tablet TAKE TWO (2) TABLETS BY MOUTH THREE TIMES DAILY (Patient taking differently: Take 2 tablets by mouth 3 (three) times daily.) 270 tablet 3   Lancets (ONETOUCH ULTRASOFT) lancets Test 4 times daily DX E11.9 100 each 12   levothyroxine (SYNTHROID) 50 MCG tablet Take 1 tablet (50 mcg total) by mouth daily at 6 (six) AM.     liraglutide (VICTOZA) 18 MG/3ML SOPN Inject 1.8 mg into the skin every morning. 9 mL 3   megestrol (MEGACE) 40 MG tablet Take 40 mg by mouth daily.  3   metFORMIN (GLUCOPHAGE-XR) 500  MG 24 hr tablet Take 2 tablets (1,000 mg total) by mouth 2 (two) times daily. 180 tablet 3   Multiple Vitamin (MULTIVITAMIN WITH MINERALS) TABS tablet Take 1 tablet by mouth daily.     oxybutynin (DITROPAN XL) 15 MG 24 hr tablet Take 15 mg by mouth daily.      pregabalin (LYRICA) 75 MG capsule Take 75 mg 3 (three) times daily by mouth.  QUEtiapine (SEROQUEL) 300 MG tablet Take 300 mg by mouth at bedtime.     rosuvastatin (CRESTOR) 20 MG tablet Take 1 tablet (20 mg total) by mouth daily. 90 tablet 3   spironolactone (ALDACTONE) 50 MG tablet Take 1 tablet (50 mg total) by mouth daily. 90 tablet 3   sucralfate (CARAFATE) 1 GM/10ML suspension Take 10 mLs by mouth 2 (two) times daily.     topiramate (TOPAMAX) 25 MG tablet Take 75 mg by mouth at bedtime.     torsemide (DEMADEX) 20 MG tablet Take 1 tablet (20 mg total) by mouth at bedtime. 90 tablet 3   trimethobenzamide (TIGAN) 300 MG capsule Take 1 capsule (300 mg total) by mouth 3 (three) times daily as needed for nausea/vomiting. Do not take with the Scopolamine unless nausea is not improving 10 capsule 0   diclofenac Sodium (VOLTAREN) 1 % GEL Apply 2 g topically 4 (four) times daily. 350 g 0   azithromycin (ZITHROMAX) 250 MG tablet Take 1 tablet (250 mg total) by mouth daily. Take first 2 tablets together, then 1 every day until finished. 6 tablet 0   scopolamine (TRANSDERM-SCOP) 1 MG/3DAYS Place 1 patch (1.5 mg total) onto the skin every 3 (three) days as needed (nausea). Do not wear for more than 72 hours. 10 patch 0   No facility-administered medications prior to visit.   Final Medications at End of Visit    Current Meds  Medication Sig   ACCU-CHEK AVIVA PLUS test strip USE TO TEST BLOOD SUGAR 4 TIMES DAILY   ADVAIR HFA 230-21 MCG/ACT inhaler Inhale 1-2 puffs into the lungs 2 (two) times daily.   albuterol (PROVENTIL) (2.5 MG/3ML) 0.083% nebulizer solution Take 2.5 mg by nebulization every 6 (six) hours as needed for wheezing.    ALPRAZolam (XANAX) 0.5 MG tablet Take 0.5 mg 2 (two) times daily as needed by mouth for anxiety or sleep.    amLODipine (NORVASC) 10 MG tablet Take 1 tablet (10 mg total) by mouth daily.   apixaban (ELIQUIS) 5 MG TABS tablet Take 1 tablet (5 mg total) by mouth 2 (two) times daily.   ARIPiprazole (ABILIFY) 5 MG tablet Take 5 mg by mouth daily.   Azathioprine 75 MG TABS Take 150 mg by mouth 2 (two) times daily.   B-D UF III MINI PEN NEEDLES 31G X 5 MM MISC USE AS DIRECTED TWICE A DAY   Belimumab (BENLYSTA) 200 MG/ML SOAJ Inject 200 mg into the skin once a week. Thursdays   Blood Glucose Monitoring Suppl (ONETOUCH VERIO) w/Device KIT Test 4 times daily.  DX E11.9   carvedilol (COREG) 25 MG tablet Take 1.5 tablets (37.5 mg total) by mouth 2 (two) times daily with a meal.   cromolyn (OPTICROM) 4 % ophthalmic solution Place 1 drop into both eyes 2 (two) times daily.   cycloSPORINE (RESTASIS) 0.05 % ophthalmic emulsion Place 1 drop into both eyes in the morning, at noon, and at bedtime.    dapagliflozin propanediol (FARXIGA) 10 MG TABS tablet Take 1 tablet (10 mg total) by mouth daily before breakfast.   DULoxetine (CYMBALTA) 60 MG capsule Take 120 mg by mouth at bedtime.   EASY TOUCH PEN NEEDLES 32G X 4 MM MISC USE TO INJECT INSULIN TWICE DAILY   ENTRESTO 97-103 MG TAKE 1 TABLET BY MOUTH 2 (TWO) TIMES DAILY. (Patient taking differently: Take 1 tablet by mouth 2 (two) times daily.)   famotidine (PEPCID) 20 MG tablet Take 20 mg by mouth 2 (two) times daily.  fluticasone (FLONASE) 50 MCG/ACT nasal spray Place 2 sprays into both nostrils continuous as needed for allergies or rhinitis.   glucose blood (ACCU-CHEK GUIDE) test strip 1 each by Other route 2 (two) times daily. And lancets 2/day   hydroxychloroquine (PLAQUENIL) 200 MG tablet Take 200 mg by mouth 2 (two) times daily.   Insulin NPH, Human,, Isophane, (HUMULIN N KWIKPEN) 100 UNIT/ML Kiwkpen Inject 260 Units into the skin every morning. And pen  needles 1/day   Insulin Syringe-Needle U-100 (EASY TOUCH INSULIN SYRINGE) 31G X 5/16" 1 ML MISC USE AS DIRECTED THREE TIMES A DAY   isosorbide-hydrALAZINE (BIDIL) 20-37.5 MG tablet TAKE TWO (2) TABLETS BY MOUTH THREE TIMES DAILY (Patient taking differently: Take 2 tablets by mouth 3 (three) times daily.)   Lancets (ONETOUCH ULTRASOFT) lancets Test 4 times daily DX E11.9   levothyroxine (SYNTHROID) 50 MCG tablet Take 1 tablet (50 mcg total) by mouth daily at 6 (six) AM.   liraglutide (VICTOZA) 18 MG/3ML SOPN Inject 1.8 mg into the skin every morning.   megestrol (MEGACE) 40 MG tablet Take 40 mg by mouth daily.   metFORMIN (GLUCOPHAGE-XR) 500 MG 24 hr tablet Take 2 tablets (1,000 mg total) by mouth 2 (two) times daily.   Multiple Vitamin (MULTIVITAMIN WITH MINERALS) TABS tablet Take 1 tablet by mouth daily.   oxybutynin (DITROPAN XL) 15 MG 24 hr tablet Take 15 mg by mouth daily.    pregabalin (LYRICA) 75 MG capsule Take 75 mg 3 (three) times daily by mouth.    QUEtiapine (SEROQUEL) 300 MG tablet Take 300 mg by mouth at bedtime.   rosuvastatin (CRESTOR) 20 MG tablet Take 1 tablet (20 mg total) by mouth daily.   spironolactone (ALDACTONE) 50 MG tablet Take 1 tablet (50 mg total) by mouth daily.   sucralfate (CARAFATE) 1 GM/10ML suspension Take 10 mLs by mouth 2 (two) times daily.   topiramate (TOPAMAX) 25 MG tablet Take 75 mg by mouth at bedtime.   torsemide (DEMADEX) 20 MG tablet Take 1 tablet (20 mg total) by mouth at bedtime.   trimethobenzamide (TIGAN) 300 MG capsule Take 1 capsule (300 mg total) by mouth 3 (three) times daily as needed for nausea/vomiting. Do not take with the Scopolamine unless nausea is not improving   [DISCONTINUED] diclofenac Sodium (VOLTAREN) 1 % GEL Apply 2 g topically 4 (four) times daily.   Radiology:   CXR 02/01/2020: New patchy bilateral airspace disease most consistent with pneumonia.  Chronic cardiomegaly and vascular congestion.  CT angio PE 05/21/2021: 1.  Bilateral occlusive acute pulmonary emboli within the proximal segmental pulmonary arteries. Overall clot burden is severe. Evidence of RIGHT ventricular strain (RV/LV Ratio = 1.15) consistent with at least submassive (intermediate risk) PE.  2. No pulmonary infarction.  Cardiac Studies:   Coronary angiogram 02/07/2012: Normal coronary arteries and LVEF. Left dominant circulation.  Carotid duplex 03/06/2014: The vertebral arteries appear patent with antegrade flow. Findings consistent with 1-39 percent stenosis involving the right internal carotid artery and the left internal carotid artery.  Vascular Ultrasound Lower Extremity Venous 03/26/2018: Right: There is no evidence of deep vein thrombosis in the lower extremity. No cystic structure found in the popliteal fossa. Left: No evidence of common femoral vein obstruction.  DVT study 05/22/2021: BILATERAL: No evidence of deep vein thrombosis seen in the lower extremities,  bilaterally. No evidence of popliteal cyst, bilaterally.  PCV ECHOCARDIOGRAM COMPLETE 25/85/2778 Normal LV systolic function with visual EF 55-60%. Left ventricle cavity is normal in size. Normal left ventricular  wall thickness. Normal global wall motion. Normal diastolic filling pattern, normal LAP. Mild tricuspid regurgitation. No evidence of pulmonary hypertension. Compared to study 05/21/2021 LVEF improved from 30-35% to 55-60%, G1 DD is now normal, RV systolic function has improved, otherwise no significant change.  EKG  06/09/2021: Sinus rhythm at a rate of 97 bpm.  Normal axis.  Poor R wave progression, cannot exclude anteroseptal infarct old.  Diffuse nonspecific T wave abnormality.  Low voltage complexes, consider pulmonary disease pattern.  01/21/2020: Normal sinus rhythm with rate of 100 bpm, left axis deviation, poor R wave progression, cannot exclude anterolateral infarct old.  Low-voltage chest leads, consider pulmonary disease pattern.  Nonspecific T  abnormality.  Assessment:     ICD-10-CM   1. Chronic combined systolic and diastolic heart failure (HCC)  I50.42     2. Essential hypertension  I10     3. Mild hyperlipidemia  E78.5       Meds ordered this encounter  Medications   diclofenac Sodium (VOLTAREN) 1 % GEL    Sig: Apply 2 g topically 4 (four) times daily.    Dispense:  350 g    Refill:  1   Medications Discontinued During This Encounter  Medication Reason   azithromycin (ZITHROMAX) 250 MG tablet    scopolamine (TRANSDERM-SCOP) 1 MG/3DAYS    diclofenac Sodium (VOLTAREN) 1 % GEL Reorder   Recommendations:   MACHELE DEIHL  is a 51 y.o. female  with Sjogren's syndrome, SLE, chronic systolic heart failure now with improved EF to 45-50% on echo in Nov 2019, morbid obesity, obesity hypoventilation syndrome, uncontrolled type 2 DM, hypertension, h/o CVA in 2014, OSA on CPAP and hepatic  adenoma s/p embolization on 08/12/19.  Patient was admitted 05/21/2021 - 05/27/2021 with submassive PE with right heart strain and further reduction of LVEF to 30-35% from 45-50% in 12/2020.  Patient has been on guideline directed therapy as well as anticoagulation and LVEF has improved at recheck to normal.  Patient presents for 34-monthfollow-up.  She is doing well overall and is without clinical evidence of heart failure at today's office visit.  Reviewed and discussed results of echocardiogram which shows normalization of LVEF and no diastolic dysfunction as previously noted, details above.  Patient's blood pressure is well controlled.  In regard to hyperlipidemia, patient is tolerating rosuvastatin without issue.  She is scheduled for repeat lipid profile testing with PCP next month, will defer testing to them but have requested that labs be shared with our office.  In regard to FIran advised patient that I agree with endocrinology's recommendations to start this medication, patient verbalized agreement.  No changes are made to  medications at today's office visit, however I did refill diclofenac gel.  Follow-up in 6 months, sooner if needed.   CAlethia Berthold PA-C 09/20/2021, 5:14 PM Office: [phone]

## 2021-10-21 ENCOUNTER — Other Ambulatory Visit: Payer: Self-pay | Admitting: Cardiology

## 2021-10-21 DIAGNOSIS — I428 Other cardiomyopathies: Secondary | ICD-10-CM

## 2021-10-21 DIAGNOSIS — I10 Essential (primary) hypertension: Secondary | ICD-10-CM

## 2021-11-22 ENCOUNTER — Ambulatory Visit (INDEPENDENT_AMBULATORY_CARE_PROVIDER_SITE_OTHER): Payer: Medicaid Other | Admitting: Endocrinology

## 2021-11-22 VITALS — BP 128/90 | HR 105 | Ht 69.0 in | Wt 341.8 lb

## 2021-11-22 DIAGNOSIS — E1143 Type 2 diabetes mellitus with diabetic autonomic (poly)neuropathy: Secondary | ICD-10-CM

## 2021-11-22 DIAGNOSIS — E1169 Type 2 diabetes mellitus with other specified complication: Secondary | ICD-10-CM

## 2021-11-22 DIAGNOSIS — M79672 Pain in left foot: Secondary | ICD-10-CM

## 2021-11-22 DIAGNOSIS — E785 Hyperlipidemia, unspecified: Secondary | ICD-10-CM

## 2021-11-22 DIAGNOSIS — M79671 Pain in right foot: Secondary | ICD-10-CM

## 2021-11-22 DIAGNOSIS — Z794 Long term (current) use of insulin: Secondary | ICD-10-CM

## 2021-11-22 LAB — POCT GLYCOSYLATED HEMOGLOBIN (HGB A1C): Hemoglobin A1C: 8.2 % — AB (ref 4.0–5.6)

## 2021-11-22 MED ORDER — HUMULIN N KWIKPEN 100 UNIT/ML ~~LOC~~ SUPN: 280.0000 [IU] | PEN_INJECTOR | SUBCUTANEOUS | 1 refills | Status: DC

## 2021-11-22 NOTE — Patient Instructions (Addendum)
check your blood sugar 4 times a day: before the 3 meals, and at bedtime.  also check if you have symptoms of your blood sugar being too high or too low.  please keep a record of the readings and bring it to your next appointment here (or you can bring the meter itself).  You can write it on any piece of paper.  please call us sooner if your blood sugar goes below 70, or if you have a lot of readings over 200, especially if you have to increase the prednisone again.   ?Please stop taking the [place], and:  ?I have sent a prescription to your pharmacy, to increase the insulin to 280 units each morning, and: ?Please continue the same Victoza.   ?On this type of insulin schedule, you should eat meals on a regular schedule (especially lunch).  If a meal is missed or significantly delayed, your blood sugar could go low.   ?Please come back for a follow-up appointment in 3 months.    ? ?

## 2021-11-22 NOTE — Progress Notes (Signed)
? ?Subjective:  ? ? Patient ID: [name], female    DOB: 14-May-1971, 51 y.o.   MRN: [id number] ? ?HPI ?Pt returns for f/u of diabetes mellitus:  ?DM type: Insulin-requiring type 2 ?Dx'ed: 2011 ?Complications: PN, CRI, and GP.  ?Therapy: insulin (since 2014), Victoza, and 2 oral meds ?GDM: never.  ?DKA: never.  ?Severe hypoglycemia: never.   ?Pancreatitis: never.  ?Other: she is too ill to undergo weight-loss surgery; she takes qd insulin, after poor results with multiple daily injections; she takes NPH, due to pattern of cbg's. Medicaid approved Trulicity, but it is not available.  ?Interval history: no cbg record, but states cbg's vary from 115-281.  It is in general higher as the day goes on.  Pt says Farxiga causes sob.   ?Past Medical History:  ?Diagnosis Date  ? Acute renal failure ([place])   ?  Intractable nausea vomiting secondary to diabetic gastroparesis causing dehydration and acute renal failure /notes 04/01/2013  ? Anginal pain ([place])   ? Anxiety   ? Bell's palsy 08/09/2010  ? Bicornuate uterus   ? CAP (community acquired pneumonia) 10/2011  ? Archie Endo 11/08/2011  ? Chest pain 01/13/2014  ? CHF (congestive heart failure) ([place])   ? Diabetic gastroparesis associated with type 2 diabetes mellitus ([place])   ? this is presumed diagnoses, not confirmed by any studies.   ? Eczema   ? Family history of breast cancer   ? Family history of malignant neoplasm of breast   ? Family history of malignant neoplasm of ovary   ? Family history of ovarian cancer   ? Family history of prostate cancer   ? GERD (gastroesophageal reflux disease)   ? High cholesterol   ? Hypertension   ? Liver lesion 08/13/2019  ? Liver mass 2014  ? biopsied 03/2013 at [facility], not malignant.  is to undergo radiologic ablation of the mass in sept/October 2014.   ? Lupus ([place])   ? Migraines   ? "maybe a couple times/yr" (04/01/2013)  ? Obesity   ? Obstructive sleep apnea on CPAP 2011  ? Oxygen at nights  ? SJOGREN'S SYNDROME 08/09/2010  ? SLE  (systemic lupus erythematosus) ([place])   ? Archie Endo 12/01/2010  ? ? ?Past Surgical History:  ?Procedure Laterality Date  ? BREAST CYST EXCISION Left 08/2005  ? epidermoid  ? CARDIAC CATHETERIZATION  02/07/2012  ? ECTOPIC PREGNANCY SURGERY  1999  ? ESOPHAGOGASTRODUODENOSCOPY N/A 02/26/2013  ? Procedure: ESOPHAGOGASTRODUODENOSCOPY (EGD);  Surgeon: Irene Shipper, MD;  Location: [facility] ENDOSCOPY;  Service: Endoscopy;  Laterality: N/A;  ? ESOPHAGOGASTRODUODENOSCOPY N/A 03/02/2015  ? Procedure: ESOPHAGOGASTRODUODENOSCOPY (EGD);  Surgeon: Milus Banister, MD;  Location: [place];  Service: Endoscopy;  Laterality: N/A;  ? ESOPHAGOGASTRODUODENOSCOPY N/A 02/23/2021  ? Procedure: ESOPHAGOGASTRODUODENOSCOPY (EGD);  Surgeon: Wilford Corner, MD;  Location: [place];  Service: Endoscopy;  Laterality: N/A;  possible dilation  ? HERNIA REPAIR    ? LEFT AND RIGHT HEART CATHETERIZATION WITH CORONARY ANGIOGRAM N/A 02/07/2012  ? Procedure: LEFT AND RIGHT HEART CATHETERIZATION WITH CORONARY ANGIOGRAM;  Surgeon: Laverda Page, MD;  Location: [facility] CATH LAB;  Service: Cardiovascular;  Laterality: N/A;  ? LIVER BIOPSY  03/2013  ? liver mass/medical hx noted above  ? MUSCLE BIOPSY    ? for lupus/notes 12/01/2010  ? UMBILICAL HERNIA REPAIR  1980's  ? ? ?Social History  ? ?Socioeconomic History  ? Marital status: Single  ?  Spouse name: Not on file  ? Number of children: 1  ?  Years of education: Not on file  ? Highest education level: Not on file  ?Occupational History  ?  Employer: UNEMPLOYED  ?  Comment: student  ?Tobacco Use  ? Smoking status: Former  ?  Packs/day: 0.50  ?  Years: 3.00  ?  Pack years: 1.50  ?  Types: Cigarettes  ?  Quit date: 06/02/1991  ?  Years since quitting: 30.4  ? Smokeless tobacco: Never  ? Tobacco comments:  ?  quit 28 yrs ago  ?Vaping Use  ? Vaping Use: Never used  ?Substance and Sexual Activity  ? Alcohol use: No  ?  Alcohol/week: 0.0 standard drinks  ? Drug use: No  ? Sexual activity: Yes  ?  Partners: Male  ?   Birth control/protection: None  ?Other Topics Concern  ? Not on file  ?Social History Narrative  ? Regular exercise-yes  ? ?Social Determinants of Health  ? ?Financial Resource Strain: Not on file  ?Food Insecurity: Not on file  ?Transportation Needs: Not on file  ?Physical Activity: Not on file  ?Stress: Not on file  ?Social Connections: Not on file  ?Intimate Partner Violence: Not on file  ? ? ?Current Outpatient Medications on File Prior to Visit  ?Medication Sig Dispense Refill  ? ACCU-CHEK AVIVA PLUS test strip USE TO TEST BLOOD SUGAR 4 TIMES DAILY 100 strip 2  ? ADVAIR HFA 230-21 MCG/ACT inhaler Inhale 1-2 puffs into the lungs 2 (two) times daily.    ? albuterol (PROVENTIL) (2.5 MG/3ML) 0.083% nebulizer solution Take 2.5 mg by nebulization every 6 (six) hours as needed for wheezing.    ? ALPRAZolam (XANAX) 0.5 MG tablet Take 0.5 mg 2 (two) times daily as needed by mouth for anxiety or sleep.     ? amLODipine (NORVASC) 10 MG tablet Take 1 tablet (10 mg total) by mouth daily. 30 tablet 0  ? apixaban (ELIQUIS) 5 MG TABS tablet Take 1 tablet (5 mg total) by mouth 2 (two) times daily. 60 tablet 0  ? ARIPiprazole (ABILIFY) 5 MG tablet Take 5 mg by mouth daily.    ? Azathioprine 75 MG TABS Take 150 mg by mouth 2 (two) times daily.    ? B-D UF III MINI PEN NEEDLES 31G X 5 MM MISC USE AS DIRECTED TWICE A DAY 100 each 2  ? Belimumab (BENLYSTA) 200 MG/ML SOAJ Inject 200 mg into the skin once a week. Thursdays    ? Blood Glucose Monitoring Suppl (ONETOUCH VERIO) w/Device KIT Test 4 times daily.  DX E11.9 1 kit 1  ? carvedilol (COREG) 25 MG tablet Take 1.5 tablets (37.5 mg total) by mouth 2 (two) times daily with a meal. 270 tablet 3  ? cromolyn (OPTICROM) 4 % ophthalmic solution Place 1 drop into both eyes 2 (two) times daily.    ? cycloSPORINE (RESTASIS) 0.05 % ophthalmic emulsion Place 1 drop into both eyes in the morning, at noon, and at bedtime.     ? diclofenac Sodium (VOLTAREN) 1 % GEL Apply 2 g topically 4 (four)  times daily. 350 g 1  ? DULoxetine (CYMBALTA) 60 MG capsule Take 120 mg by mouth at bedtime.    ? EASY TOUCH PEN NEEDLES 32G X 4 MM MISC USE TO INJECT INSULIN TWICE DAILY 100 each 9  ? ENTRESTO 97-103 MG TAKE 1 TABLET BY MOUTH 2 (TWO) TIMES DAILY. (Patient taking differently: Take 1 tablet by mouth 2 (two) times daily.) 180 tablet 3  ? famotidine (PEPCID) 20 MG tablet Take  20 mg by mouth 2 (two) times daily.    ? fluticasone (FLONASE) 50 MCG/ACT nasal spray Place 2 sprays into both nostrils continuous as needed for allergies or rhinitis.    ? glucose blood (ACCU-CHEK GUIDE) test strip 1 each by Other route 2 (two) times daily. And lancets 2/day 200 each 3  ? hydroxychloroquine (PLAQUENIL) 200 MG tablet Take 200 mg by mouth 2 (two) times daily.    ? Insulin Syringe-Needle U-100 (EASY TOUCH INSULIN SYRINGE) 31G X 5/16" 1 ML MISC USE AS DIRECTED THREE TIMES A DAY 100 each 10  ? isosorbide-hydrALAZINE (BIDIL) 20-37.5 MG tablet TAKE TWO (2) TABLETS BY MOUTH THREE TIMES DAILY 270 tablet 3  ? Lancets (ONETOUCH ULTRASOFT) lancets Test 4 times daily DX E11.9 100 each 12  ? levothyroxine (SYNTHROID) 50 MCG tablet Take 1 tablet (50 mcg total) by mouth daily at 6 (six) AM.    ? liraglutide (VICTOZA) 18 MG/3ML SOPN Inject 1.8 mg into the skin every morning. 9 mL 3  ? megestrol (MEGACE) 40 MG tablet Take 40 mg by mouth daily.  3  ? metFORMIN (GLUCOPHAGE-XR) 500 MG 24 hr tablet Take 2 tablets (1,000 mg total) by mouth 2 (two) times daily. 180 tablet 3  ? Multiple Vitamin (MULTIVITAMIN WITH MINERALS) TABS tablet Take 1 tablet by mouth daily.    ? oxybutynin (DITROPAN XL) 15 MG 24 hr tablet Take 15 mg by mouth daily.     ? pregabalin (LYRICA) 75 MG capsule Take 75 mg 3 (three) times daily by mouth.     ? QUEtiapine (SEROQUEL) 300 MG tablet Take 300 mg by mouth at bedtime.    ? spironolactone (ALDACTONE) 50 MG tablet Take 1 tablet (50 mg total) by mouth daily. 90 tablet 3  ? sucralfate (CARAFATE) 1 GM/10ML suspension Take 10 mLs by  mouth 2 (two) times daily.    ? topiramate (TOPAMAX) 25 MG tablet Take 75 mg by mouth at bedtime.    ? torsemide (DEMADEX) 20 MG tablet Take 1 tablet (20 mg total) by mouth at bedtime. 90 tablet 3  ? trimethob

## 2021-11-24 ENCOUNTER — Encounter: Payer: Self-pay | Admitting: Endocrinology

## 2021-11-25 ENCOUNTER — Other Ambulatory Visit (HOSPITAL_COMMUNITY): Payer: Self-pay

## 2021-11-25 ENCOUNTER — Telehealth: Payer: Self-pay

## 2021-11-25 NOTE — Telephone Encounter (Signed)
Patient Advocate Encounter ? ?Prior Authorization for HumuLIN N KwikPen 100UNIT/ML pen-injectors has been approved.   ? ?PA#  [id number] ? ?Effective dates: 11/25/21 through 11/25/22 ? ?Per Test Claim Patients co-pay is $4.  ? ?Spoke with Pharmacy to Process. ? ?Patient Advocate ?Fax: [phone]  ?

## 2021-12-06 ENCOUNTER — Telehealth: Payer: Self-pay | Admitting: Pharmacy Technician

## 2021-12-06 ENCOUNTER — Other Ambulatory Visit (HOSPITAL_COMMUNITY): Payer: Self-pay

## 2021-12-06 NOTE — Telephone Encounter (Signed)
Patient Advocate Encounter ? ?Received Notification from [facility] pharmacy that PA is required for Farxiga '10mg'$   ? ?Per Test Claim: Step Therapy req. ? ?However, per pt's notes, MD d/c med 11/22/21 ?Archived in [facility]. Key [id number] ? ? ?

## 2021-12-21 ENCOUNTER — Other Ambulatory Visit: Payer: Self-pay | Admitting: Physician Assistant

## 2021-12-21 ENCOUNTER — Telehealth: Payer: Self-pay

## 2021-12-21 DIAGNOSIS — I2609 Other pulmonary embolism with acute cor pulmonale: Secondary | ICD-10-CM

## 2021-12-21 NOTE — Telephone Encounter (Signed)
Called pt, to confirm appt date/time for 12/22/2021. Pt verbalizes confirmation.

## 2021-12-22 ENCOUNTER — Inpatient Hospital Stay: Payer: Medicaid Other | Attending: Physician Assistant | Admitting: Physician Assistant

## 2021-12-22 ENCOUNTER — Telehealth: Payer: Self-pay

## 2021-12-22 ENCOUNTER — Telehealth: Payer: Self-pay | Admitting: Physician Assistant

## 2021-12-22 ENCOUNTER — Other Ambulatory Visit: Payer: Self-pay

## 2021-12-22 ENCOUNTER — Inpatient Hospital Stay: Payer: Medicaid Other

## 2021-12-22 VITALS — BP 131/80 | HR 94 | Temp 98.1°F | Resp 20 | Wt 322.6 lb

## 2021-12-22 DIAGNOSIS — G4733 Obstructive sleep apnea (adult) (pediatric): Secondary | ICD-10-CM | POA: Insufficient documentation

## 2021-12-22 DIAGNOSIS — Z880 Allergy status to penicillin: Secondary | ICD-10-CM | POA: Diagnosis not present

## 2021-12-22 DIAGNOSIS — E669 Obesity, unspecified: Secondary | ICD-10-CM | POA: Insufficient documentation

## 2021-12-22 DIAGNOSIS — D649 Anemia, unspecified: Secondary | ICD-10-CM | POA: Diagnosis not present

## 2021-12-22 DIAGNOSIS — Z803 Family history of malignant neoplasm of breast: Secondary | ICD-10-CM | POA: Insufficient documentation

## 2021-12-22 DIAGNOSIS — Z8042 Family history of malignant neoplasm of prostate: Secondary | ICD-10-CM | POA: Diagnosis not present

## 2021-12-22 DIAGNOSIS — E785 Hyperlipidemia, unspecified: Secondary | ICD-10-CM | POA: Diagnosis not present

## 2021-12-22 DIAGNOSIS — Z79899 Other long term (current) drug therapy: Secondary | ICD-10-CM | POA: Insufficient documentation

## 2021-12-22 DIAGNOSIS — Z8673 Personal history of transient ischemic attack (TIA), and cerebral infarction without residual deficits: Secondary | ICD-10-CM | POA: Diagnosis not present

## 2021-12-22 DIAGNOSIS — Z86711 Personal history of pulmonary embolism: Secondary | ICD-10-CM | POA: Insufficient documentation

## 2021-12-22 DIAGNOSIS — I509 Heart failure, unspecified: Secondary | ICD-10-CM | POA: Diagnosis not present

## 2021-12-22 DIAGNOSIS — Z800 Family history of malignant neoplasm of digestive organs: Secondary | ICD-10-CM | POA: Insufficient documentation

## 2021-12-22 DIAGNOSIS — Z7901 Long term (current) use of anticoagulants: Secondary | ICD-10-CM | POA: Diagnosis not present

## 2021-12-22 DIAGNOSIS — Z823 Family history of stroke: Secondary | ICD-10-CM | POA: Insufficient documentation

## 2021-12-22 DIAGNOSIS — I110 Hypertensive heart disease with heart failure: Secondary | ICD-10-CM | POA: Insufficient documentation

## 2021-12-22 DIAGNOSIS — M3500 Sicca syndrome, unspecified: Secondary | ICD-10-CM | POA: Insufficient documentation

## 2021-12-22 DIAGNOSIS — Z836 Family history of other diseases of the respiratory system: Secondary | ICD-10-CM | POA: Diagnosis not present

## 2021-12-22 DIAGNOSIS — E119 Type 2 diabetes mellitus without complications: Secondary | ICD-10-CM | POA: Diagnosis not present

## 2021-12-22 DIAGNOSIS — Z8249 Family history of ischemic heart disease and other diseases of the circulatory system: Secondary | ICD-10-CM | POA: Insufficient documentation

## 2021-12-22 DIAGNOSIS — Z8041 Family history of malignant neoplasm of ovary: Secondary | ICD-10-CM | POA: Insufficient documentation

## 2021-12-22 DIAGNOSIS — I2609 Other pulmonary embolism with acute cor pulmonale: Secondary | ICD-10-CM

## 2021-12-22 DIAGNOSIS — Z885 Allergy status to narcotic agent status: Secondary | ICD-10-CM | POA: Diagnosis not present

## 2021-12-22 DIAGNOSIS — Z833 Family history of diabetes mellitus: Secondary | ICD-10-CM | POA: Insufficient documentation

## 2021-12-22 DIAGNOSIS — Z8261 Family history of arthritis: Secondary | ICD-10-CM | POA: Insufficient documentation

## 2021-12-22 LAB — CMP (CANCER CENTER ONLY)
ALT: 6 U/L (ref 0–44)
AST: 7 U/L — ABNORMAL LOW (ref 15–41)
Albumin: 3.6 g/dL (ref 3.5–5.0)
Alkaline Phosphatase: 82 U/L (ref 38–126)
Anion gap: 6 (ref 5–15)
BUN: 11 mg/dL (ref 6–20)
CO2: 22 mmol/L (ref 22–32)
Calcium: 8.9 mg/dL (ref 8.9–10.3)
Chloride: 113 mmol/L — ABNORMAL HIGH (ref 98–111)
Creatinine: 0.99 mg/dL (ref 0.44–1.00)
GFR, Estimated: >60 mL/min (ref >60)
Glucose, Bld: 288 mg/dL — ABNORMAL HIGH (ref 70–99)
Potassium: 4.1 mmol/L (ref 3.5–5.1)
Sodium: 141 mmol/L (ref 135–145)
Total Bilirubin: 0.4 mg/dL (ref 0.3–1.2)
Total Protein: 6.6 g/dL (ref 6.5–8.1)

## 2021-12-22 LAB — CBC WITH DIFFERENTIAL (CANCER CENTER ONLY)
Abs Immature Granulocytes: 0.03 10*3/uL (ref 0.00–0.07)
Basophils Absolute: 0 10*3/uL (ref 0.0–0.1)
Basophils Relative: 1 %
Eosinophils Absolute: 0.6 10*3/uL — ABNORMAL HIGH (ref 0.0–0.5)
Eosinophils Relative: 7 %
HCT: 30.4 % — ABNORMAL LOW (ref 36.0–46.0)
Hemoglobin: 9.8 g/dL — ABNORMAL LOW (ref 12.0–15.0)
Immature Granulocytes: 0 %
Lymphocytes Relative: 37 %
Lymphs Abs: 3.1 10*3/uL (ref 0.7–4.0)
MCH: 29.3 pg (ref 26.0–34.0)
MCHC: 32.2 g/dL (ref 30.0–36.0)
MCV: 91 fL (ref 80.0–100.0)
Monocytes Absolute: 0.8 10*3/uL (ref 0.1–1.0)
Monocytes Relative: 10 %
Neutro Abs: 3.7 10*3/uL (ref 1.7–7.7)
Neutrophils Relative %: 45 %
Platelet Count: 247 10*3/uL (ref 150–400)
RBC: 3.34 MIL/uL — ABNORMAL LOW (ref 3.87–5.11)
RDW: 13.9 % (ref 11.5–15.5)
WBC Count: 8.2 10*3/uL (ref 4.0–10.5)
nRBC: 0 % (ref 0.0–0.2)

## 2021-12-22 NOTE — Progress Notes (Signed)
[place] Telephone:([phone]) [phone]   [phone]) [phone]  PROGRESS NOTE  Patient Care Team: Bartholome Bill, MD as PCP - General (Family Medicine)  CHIEF COMPLAINTS/PURPOSE OF CONSULTATION:  H/O of submassive bilateral pulmonary embolism with right heart strain   HISTORY OF PRESENTING ILLNESS:  [name] 51 y.o. female with medical history significant for  lupus, Sjogren's syndrome, congestive heart failure, type 2 diabetes, HLD, HTN, obesity, OSA, and stroke.  On review of the previous records, [name] presented on 05/21/2021 to the emergency room due to shortness of breath. CT angiogram revealed  bilateral occlusive acute pulmonary emboli within the proximal segmental pulmonary arteries. Overall clot burden is severe.Evidence of RIGHT ventricular strain (RV/LV Ratio = 1.15) consistent with at least submassive (intermediate risk) PE. Required 5 L of supplemental oxygen that was eventually weaned off to room air. She was on IV heparin for 2 days and then transitioned to Eliquis 5 mg twice daily. Doppler ultrasound of bilateral LE was negative for DVTs. She underwent anticoagulation workup by checking anticardiolipin antibodies and lupus anticoagulant, both were unremarkable.    INTERIM HISTORY: [name] reports chronic, persistent fatigue that she attributes to her lupus diagnosis.  She does feel unsteady and uses a cane for ambulation.  Her appetite is unchanged and she denies any significant weight changes.  She reports occasional episodes of nausea without vomiting episodes.  Her bowel habits are unchanged without any diarrhea or constipation.  She denies easy bruising or signs of active bleeding.  Patient has occasional episodes of swelling in her ankles due to her CHF.  She is compliant with taking diuretics which improves the swelling.  Breathing is markedly improved since her PE diagnosis.  She has occasional episodes of shortness of breath with exertion.  She  has no other symptoms. She denies fevers, chills, night sweats, chest pain or cough. She has no other complaints. Rest of the 10 point ROS is below.   MEDICAL HISTORY:  Past Medical History:  Diagnosis Date   Acute renal failure (HCC)     Intractable nausea vomiting secondary to diabetic gastroparesis causing dehydration and acute renal failure /notes 04/01/2013   Anginal pain ([place])    Anxiety    Bell's palsy 08/09/2010   Bicornuate uterus    CAP (community acquired pneumonia) 10/2011   [facility] Endo 11/08/2011   Chest pain 01/13/2014   CHF (congestive heart failure) ([place])    Diabetic gastroparesis associated with type 2 diabetes mellitus ([place])    this is presumed diagnoses, not confirmed by any studies.    Eczema    Family history of breast cancer    Family history of malignant neoplasm of breast    Family history of malignant neoplasm of ovary    Family history of ovarian cancer    Family history of prostate cancer    GERD (gastroesophageal reflux disease)    High cholesterol    Hypertension    Liver lesion 08/13/2019   Liver mass 2014   biopsied 03/2013 at [facility], not malignant.  is to undergo radiologic ablation of the mass in sept/October 2014.    Lupus ([place])    Migraines    "maybe a couple times/yr" (04/01/2013)   Obesity    Obstructive sleep apnea on CPAP 2011   Oxygen at nights   SJOGREN'S SYNDROME 08/09/2010   SLE (systemic lupus erythematosus) ([place])    Archie Endo 12/01/2010    SURGICAL HISTORY: Past Surgical History:  Procedure Laterality Date   BREAST  CYST EXCISION Left 08/2005   epidermoid   CARDIAC CATHETERIZATION  02/07/2012   ECTOPIC PREGNANCY SURGERY  1999   ESOPHAGOGASTRODUODENOSCOPY N/A 02/26/2013   Procedure: ESOPHAGOGASTRODUODENOSCOPY (EGD);  Surgeon:  Shipper, MD;  Location[facility] ENDOSCOPY;  Service: Endoscopy;  Laterality: N/A;   ESOPHAGOGASTRODUODENOSCOPY N/A 03/02/2015   Procedure: ESOPHAGOGASTRODUODENOSCOPY (EGD);  Surgeon: Milus Banister, MD;   Location: [place];  Service: Endoscopy;  Laterality: N/A;   ESOPHAGOGASTRODUODENOSCOPY N/A 02/23/2021   Procedure: ESOPHAGOGASTRODUODENOSCOPY (EGD);  Surgeon: Wilford Corner, MD;  Location: [place];  Service: Endoscopy;  Laterality: N/A;  possible dilation   HERNIA REPAIR     LEFT AND RIGHT HEART CATHETERIZATION WITH CORONARY ANGIOGRAM N/A 02/07/2012   Procedure: LEFT AND RIGHT HEART CATHETERIZATION WITH CORONARY ANGIOGRAM;  Surgeon: Laverda Page, MD;  Location: [facility] CATH LAB;  Service: Cardiovascular;  Laterality: N/A;   LIVER BIOPSY  03/2013   liver mass/medical hx noted above   MUSCLE BIOPSY     for lupus/notes 4/0/9735   UMBILICAL HERNIA REPAIR  1980's    SOCIAL HISTORY: Social History   Socioeconomic History   Marital status: Single    Spouse name: Not on file   Number of children: 1   Years of education: Not on file   Highest education level: Not on file  Occupational History    Employer: UNEMPLOYED    Comment: student  Tobacco Use   Smoking status: Former    Packs/day: 0.50    Years: 3.00    Pack years: 1.50    Types: Cigarettes    Quit date: 06/02/1991    Years since quitting: 30.5   Smokeless tobacco: Never   Tobacco comments:    quit 28 yrs ago  Vaping Use   Vaping Use: Never used  Substance and Sexual Activity   Alcohol use: No    Alcohol/week: 0.0 standard drinks   Drug use: No   Sexual activity: Yes    Partners: Male    Birth control/protection: None  Other Topics Concern   Not on file  Social History Narrative   Regular exercise-yes   Social Determinants of Health   Financial Resource Strain: Not on file  Food Insecurity: Not on file  Transportation Needs: Not on file  Physical Activity: Not on file  Stress: Not on file  Social Connections: Not on file  Intimate Partner Violence: Not on file    FAMILY HISTORY: Family History  Problem Relation Age of Onset   Diabetes Mother    Asthma Mother    Urticaria Mother     Hypertension Mother    Stroke Mother    Ovarian cancer Sister 14       maternal half-sister; RAD51C positive   Asthma Brother    Asthma Brother    Breast cancer Maternal Aunt 59       currently 22   Cancer Maternal Aunt        unk. primary   Cancer Maternal Aunt        unk. primary   Ovarian cancer Maternal Aunt        deceased 66s   Breast cancer Maternal Aunt        deceased 71s   Breast cancer Maternal Aunt    Stomach cancer Maternal Uncle        dx 60s; deceased   Prostate cancer Maternal Uncle        deceased 62s   Breast cancer Cousin        deceased 69s; daughter  of mat uncle with stomach ca   Hypertension Other    Stroke Other    Arthritis Other    Allergic rhinitis Neg Hx    Angioedema Neg Hx     ALLERGIES:  is allergic to metoclopramide, codeine, hydrocodone, penicillins, and percocet [oxycodone-acetaminophen].  MEDICATIONS:  Current Outpatient Medications  Medication Sig Dispense Refill   ACCU-CHEK AVIVA PLUS test strip USE TO TEST BLOOD SUGAR 4 TIMES DAILY 100 strip 2   ADVAIR HFA 230-21 MCG/ACT inhaler Inhale 1-2 puffs into the lungs 2 (two) times daily.     albuterol (PROVENTIL) (2.5 MG/3ML) 0.083% nebulizer solution Take 2.5 mg by nebulization every 6 (six) hours as needed for wheezing.     ALPRAZolam (XANAX) 0.5 MG tablet Take 0.5 mg 2 (two) times daily as needed by mouth for anxiety or sleep.      amLODipine (NORVASC) 10 MG tablet Take 1 tablet (10 mg total) by mouth daily. 30 tablet 0   apixaban (ELIQUIS) 5 MG TABS tablet Take 1 tablet (5 mg total) by mouth 2 (two) times daily. 60 tablet 0   ARIPiprazole (ABILIFY) 5 MG tablet Take 5 mg by mouth daily.     Azathioprine 75 MG TABS Take 150 mg by mouth 2 (two) times daily.     B-D UF III MINI PEN NEEDLES 31G X 5 MM MISC USE AS DIRECTED TWICE A DAY 100 each 2   Belimumab (BENLYSTA) 200 MG/ML SOAJ Inject 200 mg into the skin once a week. Thursdays     Blood Glucose Monitoring Suppl (ONETOUCH VERIO) w/Device  KIT Test 4 times daily.  DX E11.9 1 kit 1   carvedilol (COREG) 25 MG tablet Take 1.5 tablets (37.5 mg total) by mouth 2 (two) times daily with a meal. 270 tablet 3   cromolyn (OPTICROM) 4 % ophthalmic solution Place 1 drop into both eyes 2 (two) times daily.     cycloSPORINE (RESTASIS) 0.05 % ophthalmic emulsion Place 1 drop into both eyes in the morning, at noon, and at bedtime.      DULoxetine (CYMBALTA) 60 MG capsule Take 120 mg by mouth at bedtime.     EASY TOUCH PEN NEEDLES 32G X 4 MM MISC USE TO INJECT INSULIN TWICE DAILY 100 each 9   ENTRESTO 97-103 MG TAKE 1 TABLET BY MOUTH 2 (TWO) TIMES DAILY. (Patient taking differently: Take 1 tablet by mouth 2 (two) times daily.) 180 tablet 3   famotidine (PEPCID) 20 MG tablet Take 20 mg by mouth 2 (two) times daily.     fluticasone (FLONASE) 50 MCG/ACT nasal spray Place 2 sprays into both nostrils continuous as needed for allergies or rhinitis.     glucose blood (ACCU-CHEK GUIDE) test strip 1 each by Other route 2 (two) times daily. And lancets 2/day 200 each 3   hydroxychloroquine (PLAQUENIL) 200 MG tablet Take 200 mg by mouth 2 (two) times daily.     Insulin NPH, Human,, Isophane, (HUMULIN N KWIKPEN) 100 UNIT/ML Kiwkpen Inject 280 Units into the skin every morning. And pen needles 1/day 290 mL 1   Insulin Syringe-Needle U-100 (EASY TOUCH INSULIN SYRINGE) 31G X 5/16" 1 ML MISC USE AS DIRECTED THREE TIMES A DAY 100 each 10   isosorbide-hydrALAZINE (BIDIL) 20-37.5 MG tablet TAKE TWO (2) TABLETS BY MOUTH THREE TIMES DAILY 270 tablet 3   Lancets (ONETOUCH ULTRASOFT) lancets Test 4 times daily DX E11.9 100 each 12   levothyroxine (SYNTHROID) 50 MCG tablet Take 1 tablet (50 mcg total) by mouth  daily at 6 (six) AM.     liraglutide (VICTOZA) 18 MG/3ML SOPN Inject 1.8 mg into the skin every morning. 9 mL 3   megestrol (MEGACE) 40 MG tablet Take 40 mg by mouth daily.  3   metFORMIN (GLUCOPHAGE-XR) 500 MG 24 hr tablet Take 2 tablets (1,000 mg total) by mouth 2  (two) times daily. 180 tablet 3   Multiple Vitamin (MULTIVITAMIN WITH MINERALS) TABS tablet Take 1 tablet by mouth daily.     oxybutynin (DITROPAN XL) 15 MG 24 hr tablet Take 15 mg by mouth daily.      pregabalin (LYRICA) 75 MG capsule Take 75 mg 3 (three) times daily by mouth.      QUEtiapine (SEROQUEL) 300 MG tablet Take 300 mg by mouth at bedtime.     spironolactone (ALDACTONE) 50 MG tablet Take 1 tablet (50 mg total) by mouth daily. 90 tablet 3   sucralfate (CARAFATE) 1 GM/10ML suspension Take 10 mLs by mouth 2 (two) times daily.     topiramate (TOPAMAX) 25 MG tablet Take 75 mg by mouth at bedtime.     torsemide (DEMADEX) 20 MG tablet Take 1 tablet (20 mg total) by mouth at bedtime. 90 tablet 3   trimethobenzamide (TIGAN) 300 MG capsule Take 1 capsule (300 mg total) by mouth 3 (three) times daily as needed for nausea/vomiting. Do not take with the Scopolamine unless nausea is not improving 10 capsule 0   rosuvastatin (CRESTOR) 20 MG tablet Take 1 tablet (20 mg total) by mouth daily. 90 tablet 3   No current facility-administered medications for this visit.    REVIEW OF SYSTEMS:   Constitutional: ( - ) fevers, ( - )  chills , ( - ) night sweats Eyes: ( - ) blurriness of vision, ( - ) double vision, ( - ) watery eyes Ears, nose, mouth, throat, and face: ( - ) mucositis, ( - ) sore throat Respiratory: ( - ) cough, ( -) dyspnea, ( - ) wheezes Cardiovascular: ( - ) palpitation, ( - ) chest discomfort, ( - ) lower extremity swelling Gastrointestinal:  ( - ) nausea, ( - ) heartburn, ( - ) change in bowel habits Skin: ( - ) abnormal skin rashes Lymphatics: ( - ) new lymphadenopathy, ( - ) easy bruising Neurological: ( - ) numbness, ( - ) tingling, ( - ) new weaknesses Behavioral/Psych: ( - ) mood change, ( - ) new changes  All other systems were reviewed with the patient and are negative.  PHYSICAL EXAMINATION: ECOG PERFORMANCE STATUS: 1 - Symptomatic but completely ambulatory  Vitals:    12/22/21 1018  BP: 131/80  Pulse: 94  Resp: 20  Temp: 98.1 F (36.7 C)  SpO2: 100%   Filed Weights   12/22/21 1018  Weight: (!) 322 lb 9.6 oz (146.3 kg)    GENERAL: well appearing female in NAD  SKIN: skin color, texture, turgor are normal, no rashes or significant lesions EYES: conjunctiva are pink and non-injected, sclera clear OROPHARYNX: no exudate, no erythema; lips, buccal mucosa, and tongue normal   LUNGS: clear to auscultation and percussion with normal breathing effort HEART: regular rate & rhythm and no murmurs and no lower extremity edema ABDOMEN: soft, non-tender, non-distended, normal bowel sounds Musculoskeletal: no cyanosis of digits and no clubbing  PSYCH: alert & oriented x 3, fluent speech NEURO: no focal motor/sensory deficits  LABORATORY DATA:  I have reviewed the data as listed    Latest Ref Rng & Units 12/22/2021  10:52 AM 07/26/2021    3:49 AM 06/22/2021    2:35 PM  CBC  WBC 4.0 - 10.5 K/uL 8.2   10.2   9.2    Hemoglobin 12.0 - 15.0 g/dL 9.8   10.6   10.0    Hematocrit 36.0 - 46.0 % 30.4   34.4   31.8    Platelets 150 - 400 K/uL 247   291   331         Latest Ref Rng & Units 12/22/2021   10:52 AM 09/08/2021    8:06 AM 07/26/2021    3:57 AM  CMP  Glucose 70 - 99 mg/dL 288   194   365    BUN 6 - 20 mg/dL 11   19   13     Creatinine 0.44 - 1.00 mg/dL 0.99   1.17   0.87    Sodium 135 - 145 mmol/L 141   143   139    Potassium 3.5 - 5.1 mmol/L 4.1   4.1   3.6    Chloride 98 - 111 mmol/L 113   111   112    CO2 22 - 32 mmol/L 22   23   15     Calcium 8.9 - 10.3 mg/dL 8.9   9.3   8.9    Total Protein 6.5 - 8.1 g/dL 6.6    6.7    Total Bilirubin 0.3 - 1.2 mg/dL 0.4    0.4    Alkaline Phos 38 - 126 U/L 82    114    AST 15 - 41 U/L 7    14    ALT 0 - 44 U/L 6    15     RADIOGRAPHIC STUDIES: I have personally reviewed the radiological images as listed and agreed with the findings in the report.  05/21/2021: CT angiogram chest: 1. Bilateral occlusive  acute pulmonary emboli within the proximal segmental pulmonary arteries. Overall clot burden is severe. Evidence of RIGHT ventricular strain (RV/LV Ratio = 1.15) consistent with at least submassive (intermediate risk) PE. The presence of right heart strain has been associated with an increased risk of morbidity and mortality. Please refer to the "PE Focused" order set in EPIC. 2. No pulmonary infarction.   05/22/2021: Doppler Venous US Lower Extremity: BILATERAL:  - No evidence of deep vein thrombosis seen in the lower extremities,  bilaterally.  -No evidence of popliteal cyst, bilaterally.   ASSESSMENT & PLAN [name] is a 51 y.o. female who presents to the clinic for history of bilateral pulmonary emboli. She is currently on Eliquis 5 mg twice daily, tolerating well.    #Bilateral PE, unprovoked: --Hypercoaguable workup was negative --Since PE was unprovoked, recommendation is indefinite anticoagulation.  --Currently on Eliquis 5 mg twice daily. Since patient has been on Eliquis for >6 months, she is eligible for maintenance dose of 2.5 mg twice daily.    #Normocytic anemia: --Labs today show chronic but worsening anemia over the last 6-7 months --Hgb is 9.8, MCV 91.0  --Etiology includes anemia of chronic disease.  --SPEP from 07/23/2020 shows possible minute M-spike, too small to quantitate.  --Recommend patient return for additional laboratory workup to check iron panel, vitamin B12, folate and repeat SPEP/IFE and sFLC  Follow up: -6 month follow up/labs with Dr. Lorenso Courier  Orders Placed This Encounter  Procedures   Ferritin    Standing Status:   Future    Standing Expiration Date:   12/23/2022   Iron and  Iron Binding Capacity ([facility] only)    Standing Status:   Future    Standing Expiration Date:   12/23/2022   Retic Panel    Standing Status:   Future    Standing Expiration Date:   12/23/2022   Vitamin B12    Standing Status:   Future    Standing Expiration  Date:   12/22/2022   Methylmalonic acid, serum    Standing Status:   Future    Standing Expiration Date:   12/22/2022   Folate, Serum    Standing Status:   Future    Standing Expiration Date:   12/22/2022   Multiple Myeloma Panel (SPEP&IFE w/QIG)    Standing Status:   Future    Standing Expiration Date:   12/22/2022   Kappa/lambda light chains    Standing Status:   Future    Standing Expiration Date:   12/22/2022    All questions were answered. The patient knows to call the clinic with any problems, questions or concerns.  I have spent a total of 30 minutes minutes of face-to-face and non-face-to-face time, preparing to see the patient, performing a medically appropriate examination, counseling and educating the patient, ordering tests, documenting clinical information in the electronic health record,  and care coordination.   Dede Query, PA-C Department of Hematology/Oncology [place] [facility] Phone: [phone]

## 2021-12-22 NOTE — Telephone Encounter (Signed)
Scheduled per 5/24 secure chat, pt has been called and confirmed

## 2021-12-22 NOTE — Telephone Encounter (Signed)
Called pt and told her per Dede Query PA, her labs show anemia and it has worsened over the last few months. She recommends for her to come back later this week or next week to do more labs for further evaluation. This LPN told pt to be expecting a call from a scheduler regarding the lab appointment. Pt verbalized understanding.

## 2021-12-28 ENCOUNTER — Inpatient Hospital Stay: Payer: Medicaid Other

## 2021-12-30 ENCOUNTER — Inpatient Hospital Stay: Payer: Medicaid Other | Attending: Physician Assistant

## 2021-12-30 ENCOUNTER — Other Ambulatory Visit: Payer: Self-pay

## 2021-12-30 ENCOUNTER — Inpatient Hospital Stay: Payer: Medicaid Other

## 2021-12-30 DIAGNOSIS — Z79899 Other long term (current) drug therapy: Secondary | ICD-10-CM | POA: Diagnosis not present

## 2021-12-30 DIAGNOSIS — Z86711 Personal history of pulmonary embolism: Secondary | ICD-10-CM | POA: Insufficient documentation

## 2021-12-30 DIAGNOSIS — E538 Deficiency of other specified B group vitamins: Secondary | ICD-10-CM | POA: Diagnosis present

## 2021-12-30 DIAGNOSIS — D649 Anemia, unspecified: Secondary | ICD-10-CM | POA: Insufficient documentation

## 2021-12-30 LAB — IRON AND IRON BINDING CAPACITY (CC-WL,HP ONLY)
Iron: 66 ug/dL (ref 28–170)
Saturation Ratios: 23 % (ref 10.4–31.8)
TIBC: 293 ug/dL (ref 250–450)
UIBC: 227 ug/dL (ref 148–442)

## 2021-12-30 LAB — VITAMIN B12: Vitamin B-12: 163 pg/mL — ABNORMAL LOW (ref 180–914)

## 2021-12-30 LAB — FERRITIN: Ferritin: 159 ng/mL (ref 11–307)

## 2021-12-30 LAB — RETIC PANEL
Immature Retic Fract: 17.9 % — ABNORMAL HIGH (ref 2.3–15.9)
RBC.: 3.32 MIL/uL — ABNORMAL LOW (ref 3.87–5.11)
Retic Count, Absolute: 54.8 10*3/uL (ref 19.0–186.0)
Retic Ct Pct: 1.7 % (ref 0.4–3.1)
Reticulocyte Hemoglobin: 30.8 pg (ref >27.9)

## 2021-12-30 LAB — FOLATE: Folate: 9.8 ng/mL (ref >5.9)

## 2021-12-31 LAB — KAPPA/LAMBDA LIGHT CHAINS
Kappa free light chain: 24.9 mg/L — ABNORMAL HIGH (ref 3.3–19.4)
Kappa, lambda light chain ratio: 1.11 (ref 0.26–1.65)
Lambda free light chains: 22.5 mg/L (ref 5.7–26.3)

## 2022-01-02 LAB — METHYLMALONIC ACID, SERUM: Methylmalonic Acid, Quantitative: 133 nmol/L (ref 0–378)

## 2022-01-03 ENCOUNTER — Ambulatory Visit: Payer: Medicaid Other | Admitting: Podiatry

## 2022-01-03 ENCOUNTER — Encounter: Payer: Self-pay | Admitting: Podiatry

## 2022-01-03 DIAGNOSIS — M19071 Primary osteoarthritis, right ankle and foot: Secondary | ICD-10-CM

## 2022-01-03 DIAGNOSIS — M1A4710 Other secondary chronic gout, right ankle and foot, without tophus (tophi): Secondary | ICD-10-CM

## 2022-01-03 DIAGNOSIS — E1142 Type 2 diabetes mellitus with diabetic polyneuropathy: Secondary | ICD-10-CM | POA: Diagnosis not present

## 2022-01-03 NOTE — Progress Notes (Signed)
  Subjective:  Patient ID: [name], female    DOB: 1970-12-26,  MRN: [id number]  Chief Complaint  Patient presents with   Diabetes   Nail Problem   Foot Pain    51 y.o. female presents with the above complaint. History confirmed with patient.  She has intermittent numbness on both feet mostly on the right side, she also has sometimes pain and swelling and redness that develops, big toe joint on the right side.  She has a history of lupus and diabetes.  Objective:  Physical Exam: warm, good capillary refill, no trophic changes or ulcerative lesions, normal DP and PT pulses, and she does have some loss of protective sensation with neuropathy, no pain range of motion swelling or redness around the first MTPJ.  Assessment:   1. 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 Arthritis of right foot   3. Diabetic peripheral neuropathy ([place])      Plan:  Patient was evaluated and treated and all questions answered.  Patient educated on diabetes. Discussed proper diabetic foot care and discussed risks and complications of disease. Educated patient in depth on reasons to return to the office immediately should he/she discover anything concerning or new on the feet. All questions answered. Discussed proper shoes as well.   Discussed with her the pain swelling numbness and redness could be multifactorial.  I discussed with her the risk factors of diabetic peripheral neuropathy which I certainly think she has.  Most of this seems to be numb rather than pain.  She currently takes Lyrica for this.  We also discussed that there is a possibility this could be related to her lupus or as well gout.  She currently is under treatment for her lupus, I do think it be important to check her for gout as well and I ordered her a uric acid that she will have checked.  I will discuss with her further treatment once we have the results of this but  Return in about 1  year (around 01/04/2023) for diabetic foot exam.

## 2022-01-03 NOTE — Patient Instructions (Addendum)
Look for Vionic shoes for a house shoe/slipper  Look for Voltaren gel at the pharmacy over the counter or online (also known as diclofenac 1% gel). Apply to the painful areas 3-4x daily with the supplied dosing card. Allow to dry for 10 minutes before going into socks/shoes

## 2022-01-04 ENCOUNTER — Telehealth: Payer: Self-pay | Admitting: Physician Assistant

## 2022-01-04 DIAGNOSIS — E538 Deficiency of other specified B group vitamins: Secondary | ICD-10-CM | POA: Insufficient documentation

## 2022-01-04 LAB — MULTIPLE MYELOMA PANEL, SERUM
Albumin SerPl Elph-Mcnc: 3.4 g/dL (ref 2.9–4.4)
Albumin/Glob SerPl: 1.4 (ref 0.7–1.7)
Alpha 1: 0.2 g/dL (ref 0.0–0.4)
Alpha2 Glob SerPl Elph-Mcnc: 0.7 g/dL (ref 0.4–1.0)
B-Globulin SerPl Elph-Mcnc: 1 g/dL (ref 0.7–1.3)
Gamma Glob SerPl Elph-Mcnc: 0.7 g/dL (ref 0.4–1.8)
Globulin, Total: 2.5 g/dL (ref 2.2–3.9)
IgA: 215 mg/dL (ref 87–352)
IgG (Immunoglobin G), Serum: 824 mg/dL (ref 586–1602)
IgM (Immunoglobulin M), Srm: 28 mg/dL (ref 26–217)
Total Protein ELP: 5.9 g/dL — ABNORMAL LOW (ref 6.0–8.5)

## 2022-01-04 NOTE — Telephone Encounter (Signed)
I called [name] to review the lab results from 12/30/2021. Findings revealed vitamin B12 deficiency. No evidence of folate or iron deficiencies, paraproteinemia. Recommend B12 injections once a week x 4 and then transition to once a month. Plan to see her back in 3 months to repeat labs.

## 2022-01-05 ENCOUNTER — Telehealth: Payer: Self-pay | Admitting: Hematology and Oncology

## 2022-01-05 NOTE — Telephone Encounter (Signed)
Scheduled per 6/6 secure chat, message has been left with pt

## 2022-01-10 ENCOUNTER — Other Ambulatory Visit: Payer: Self-pay | Admitting: Student

## 2022-01-12 ENCOUNTER — Other Ambulatory Visit: Payer: Self-pay

## 2022-01-12 ENCOUNTER — Inpatient Hospital Stay: Payer: Medicaid Other

## 2022-01-12 DIAGNOSIS — E538 Deficiency of other specified B group vitamins: Secondary | ICD-10-CM

## 2022-01-12 DIAGNOSIS — Z86711 Personal history of pulmonary embolism: Secondary | ICD-10-CM | POA: Diagnosis not present

## 2022-01-12 MED ORDER — VICTOZA 18 MG/3ML ~~LOC~~ SOPN: 1.8000 mg | PEN_INJECTOR | SUBCUTANEOUS | 2 refills | Status: DC

## 2022-01-12 MED ORDER — CYANOCOBALAMIN 1000 MCG/ML IJ SOLN
1000.0000 ug | Freq: Once | INTRAMUSCULAR | Status: AC
  Administered 2022-01-12: 1000 ug via INTRAMUSCULAR
  Filled 2022-01-12: qty 1

## 2022-01-19 ENCOUNTER — Inpatient Hospital Stay: Payer: Medicaid Other

## 2022-01-19 ENCOUNTER — Telehealth: Payer: Self-pay | Admitting: Hematology and Oncology

## 2022-01-19 NOTE — Telephone Encounter (Signed)
Per 6/21 phone lines pt called and said she hurt her back and wanted to cancel appointment for today and just come in for the next week injection

## 2022-01-26 ENCOUNTER — Inpatient Hospital Stay: Payer: Medicaid Other

## 2022-01-26 ENCOUNTER — Other Ambulatory Visit: Payer: Self-pay

## 2022-01-26 DIAGNOSIS — E538 Deficiency of other specified B group vitamins: Secondary | ICD-10-CM

## 2022-01-26 DIAGNOSIS — Z86711 Personal history of pulmonary embolism: Secondary | ICD-10-CM | POA: Diagnosis not present

## 2022-01-26 MED ORDER — CYANOCOBALAMIN 1000 MCG/ML IJ SOLN
1000.0000 ug | Freq: Once | INTRAMUSCULAR | Status: AC
  Administered 2022-01-26: 1000 ug via INTRAMUSCULAR
  Filled 2022-01-26: qty 1

## 2022-01-26 NOTE — Patient Instructions (Signed)
Vitamin B12 Deficiency Vitamin B12 deficiency means that your body does not have enough vitamin B12. The body needs this important vitamin: To make red blood cells. To make genes (DNA). To help the nerves work. If you do not have enough vitamin B12 in your body, you can have health problems, such as not having enough red blood cells in the blood (anemia). What are the causes? Not eating enough foods that contain vitamin B12. Not being able to take in (absorb) vitamin B12 from the food that you eat. Certain diseases. A condition in which the body does not make enough of a certain protein. This results in your body not taking in enough vitamin B12. Having a surgery in which part of the stomach or small intestine is taken out. Taking medicines that make it hard for the body to take in vitamin B12. These include: Heartburn medicines. Some medicines that are used to treat diabetes. What increases the risk? Being an older adult. Eating a vegetarian or vegan diet that does not include any foods that come from animals. Not eating enough foods that contain vitamin B12 while you are pregnant. Taking certain medicines. Having alcoholism. What are the signs or symptoms? In some cases, there are no symptoms. If the condition leads to too few blood cells or nerve damage, symptoms can occur, such as: Feeling weak or tired. Not being hungry. Losing feeling (numbness) or tingling in your hands and feet. Redness and burning of the tongue. Feeling sad (depressed). Confusion or memory problems. Trouble walking. If anemia is very bad, symptoms can include: Being short of breath. Being dizzy. Having a very fast heartbeat. How is this treated? Changing the way you eat and drink, such as: Eating more foods that contain vitamin B12. Drinking little or no alcohol. Getting vitamin B12 shots. Taking vitamin B12 supplements by mouth (orally). Your doctor will tell you the dose that is best for you. Follow  these instructions at home: Eating and drinking  Eat foods that come from animals and have a lot of vitamin B12 in them. These include: Meats and poultry. This includes beef, pork, chicken, turkey, and organ meats, such as liver. Seafood, such as clams, rainbow trout, salmon, tuna, and haddock. Eggs. Dairy foods such as milk, yogurt, and cheese. Eat breakfast cereals that have vitamin B12 added to them (are fortified). Check the label. The items listed above may not be a complete list of foods and beverages you can eat and drink. Contact a dietitian for more information. Alcohol use Do not drink alcohol if: Your doctor tells you not to drink. You are pregnant, may be pregnant, or are planning to become pregnant. If you drink alcohol: Limit how much you have to: 0-1 drink a day for women. 0-2 drinks a day for men. Know how much alcohol is in your drink. In the [place] one drink equals one 12 oz bottle of beer (355 mL), one 5 oz glass of wine (148 mL), or one 1 oz glass of hard liquor (44 mL). General instructions Get any vitamin B12 shots if told by your doctor. Take supplements only as told by your doctor. Follow the directions. Keep all follow-up visits. Contact a doctor if: Your symptoms come back. Your symptoms get worse or do not get better with treatment. Get help right away if: You have trouble breathing. You have a very fast heartbeat. You have chest pain. You get dizzy. You faint. These symptoms may be an emergency. Get help right away. Call 911.   Do not wait to see if the symptoms will go away. Do not drive yourself to the hospital. Summary Vitamin B12 deficiency means that your body is not getting enough of the vitamin. In some cases, there are no symptoms of this condition. Treatment may include making a change in the way you eat and drink, getting shots, or taking supplements. Eat foods that have vitamin B12 in them. This information is not intended to replace advice  given to you by your health care provider. Make sure you discuss any questions you have with your health care provider. Document Revised: 03/12/2021 Document Reviewed: 03/12/2021 Elsevier Patient Education  2023 [employer]  

## 2022-02-02 ENCOUNTER — Other Ambulatory Visit: Payer: Self-pay

## 2022-02-02 ENCOUNTER — Inpatient Hospital Stay: Payer: Medicaid Other | Attending: Physician Assistant

## 2022-02-02 DIAGNOSIS — Z86711 Personal history of pulmonary embolism: Secondary | ICD-10-CM | POA: Diagnosis present

## 2022-02-02 DIAGNOSIS — E538 Deficiency of other specified B group vitamins: Secondary | ICD-10-CM | POA: Insufficient documentation

## 2022-02-02 MED ORDER — CYANOCOBALAMIN 1000 MCG/ML IJ SOLN
1000.0000 ug | Freq: Once | INTRAMUSCULAR | Status: AC
  Administered 2022-02-02: 1000 ug via INTRAMUSCULAR
  Filled 2022-02-02: qty 1

## 2022-02-02 NOTE — Patient Instructions (Signed)
Vitamin B12 Deficiency Vitamin B12 deficiency occurs when the body does not have enough of this important vitamin. The body needs this vitamin: To make red blood cells. To make DNA. This is the genetic material inside cells. To help the nerves work properly so they can carry messages from the brain to the body. Vitamin B12 deficiency can cause health problems, such as not having enough red blood cells in the blood (anemia). This can lead to nerve damage if untreated. What are the causes? This condition may be caused by: Not eating enough foods that contain vitamin B12. Not having enough stomach acid and digestive fluids to properly absorb vitamin B12 from the food that you eat. Having certain diseases that make it hard to absorb vitamin B12. These diseases include Crohn's disease, chronic pancreatitis, and cystic fibrosis. An autoimmune disorder in which the body does not make enough of a protein (intrinsic factor) within the stomach, resulting in not enough absorption of vitamin B12. Having a surgery in which part of the stomach or small intestine is removed. Taking certain medicines that make it hard for the body to absorb vitamin B12. These include: Heartburn medicines, such as antacids and proton pump inhibitors. Some medicines that are used to treat diabetes. What increases the risk? The following factors may make you more likely to develop a vitamin B12 deficiency: Being an older adult. Eating a vegetarian or vegan diet that does not include any foods that come from animals. Eating a poor diet while you are pregnant. Taking certain medicines. Having alcoholism. What are the signs or symptoms? In some cases, there are no symptoms of this condition. If the condition leads to anemia or nerve damage, various symptoms may occur, such as: Weakness. Tiredness (fatigue). Loss of appetite. Numbness or tingling in your hands and feet. Redness and burning of the tongue. Depression,  confusion, or memory problems. Trouble walking. If anemia is severe, symptoms can include: Shortness of breath. Dizziness. Rapid heart rate. How is this diagnosed? This condition may be diagnosed with a blood test to measure the level of vitamin B12 in your blood. You may also have other tests, including: A group of tests that measure certain characteristics of blood cells (complete blood count, CBC). A blood test to measure intrinsic factor. A procedure where a thin tube with a camera on the end is used to look into your stomach or intestines (endoscopy). Other tests may be needed to discover the cause of the deficiency. How is this treated? Treatment for this condition depends on the cause. This condition may be treated by: Changing your eating and drinking habits, such as: Eating more foods that contain vitamin B12. Drinking less alcohol or no alcohol. Getting vitamin B12 injections. Taking vitamin B12 supplements by mouth (orally). Your health care provider will tell you which dose is best for you. Follow these instructions at home: Eating and drinking  Include foods in your diet that come from animals and contain a lot of vitamin B12. These include: Meats and poultry. This includes beef, pork, chicken, turkey, and organ meats, such as liver. Seafood. This includes clams, rainbow trout, salmon, tuna, and haddock. Eggs. Dairy foods such as milk, yogurt, and cheese. Eat foods that have vitamin B12 added to them (are fortified), such as ready-to-eat breakfast cereals. Check the label on the package to see if a food is fortified. The items listed above may not be a complete list of foods and beverages you can eat and drink. Contact a dietitian for   more information. Alcohol use Do not drink alcohol if: Your health care provider tells you not to drink. You are pregnant, may be pregnant, or are planning to become pregnant. If you drink alcohol: Limit how much you have to: 0-1 drink a  day for women. 0-2 drinks a day for men. Know how much alcohol is in your drink. In the [place] one drink equals one 12 oz bottle of beer (355 mL), one 5 oz glass of wine (148 mL), or one 1 oz glass of hard liquor (44 mL). General instructions Get vitamin B12 injections if told to by your health care provider. Take supplements only as told by your health care provider. Follow the directions carefully. Keep all follow-up visits. This is important. Contact a health care provider if: Your symptoms come back. Your symptoms get worse or do not improve with treatment. Get help right away: You develop shortness of breath. You have a rapid heart rate. You have chest pain. You become dizzy or you faint. These symptoms may be an emergency. Get help right away. Call 911. Do not wait to see if the symptoms will go away. Do not drive yourself to the hospital. Summary Vitamin B12 deficiency occurs when the body does not have enough of this important vitamin. Common causes include not eating enough foods that contain vitamin B12, not being able to absorb vitamin B12 from the food that you eat, having a surgery in which part of the stomach or small intestine is removed, or taking certain medicines. Eat foods that have vitamin B12 in them. Treatment may include making a change in the way you eat and drink, getting vitamin B12 injections, or taking vitamin B12 supplements. This information is not intended to replace advice given to you by your health care provider. Make sure you discuss any questions you have with your health care provider. Document Revised: 03/12/2021 Document Reviewed: 03/12/2021 Elsevier Patient Education  2023 [employer]vier Inc.  

## 2022-02-04 ENCOUNTER — Other Ambulatory Visit: Payer: Self-pay | Admitting: Cardiology

## 2022-02-04 DIAGNOSIS — I5032 Chronic diastolic (congestive) heart failure: Secondary | ICD-10-CM

## 2022-02-20 ENCOUNTER — Other Ambulatory Visit: Payer: Self-pay | Admitting: Endocrinology

## 2022-02-20 DIAGNOSIS — D649 Anemia, unspecified: Secondary | ICD-10-CM

## 2022-02-20 DIAGNOSIS — E1169 Type 2 diabetes mellitus with other specified complication: Secondary | ICD-10-CM

## 2022-02-21 ENCOUNTER — Other Ambulatory Visit (INDEPENDENT_AMBULATORY_CARE_PROVIDER_SITE_OTHER): Payer: Medicaid Other

## 2022-02-21 DIAGNOSIS — D649 Anemia, unspecified: Secondary | ICD-10-CM

## 2022-02-21 DIAGNOSIS — E1169 Type 2 diabetes mellitus with other specified complication: Secondary | ICD-10-CM

## 2022-02-21 DIAGNOSIS — E785 Hyperlipidemia, unspecified: Secondary | ICD-10-CM

## 2022-02-21 LAB — COMPREHENSIVE METABOLIC PANEL
ALT: 8 U/L (ref 0–35)
AST: 13 U/L (ref 0–37)
Albumin: 4.2 g/dL (ref 3.5–5.2)
Alkaline Phosphatase: 72 U/L (ref 39–117)
BUN: 10 mg/dL (ref 6–23)
CO2: 17 mEq/L — ABNORMAL LOW (ref 19–32)
Calcium: 9.4 mg/dL (ref 8.4–10.5)
Chloride: 112 mEq/L (ref 96–112)
Creatinine, Ser: 1.03 mg/dL (ref 0.40–1.20)
GFR: 62.96 mL/min (ref >60.00)
Glucose, Bld: 146 mg/dL — ABNORMAL HIGH (ref 70–99)
Potassium: 3.8 mEq/L (ref 3.5–5.1)
Sodium: 139 mEq/L (ref 135–145)
Total Bilirubin: 0.6 mg/dL (ref 0.2–1.2)
Total Protein: 7 g/dL (ref 6.0–8.3)

## 2022-02-21 LAB — MICROALBUMIN / CREATININE URINE RATIO
Creatinine,U: 427.3 mg/dL
Microalb Creat Ratio: 1.6 mg/g (ref 0.0–30.0)
Microalb, Ur: 7 mg/dL — ABNORMAL HIGH (ref 0.0–1.9)

## 2022-02-21 LAB — LIPID PANEL
Cholesterol: 93 mg/dL (ref 0–200)
HDL: 37 mg/dL — ABNORMAL LOW (ref >39.00)
LDL Cholesterol: 45 mg/dL (ref 0–99)
NonHDL: 56.31
Total CHOL/HDL Ratio: 3
Triglycerides: 55 mg/dL (ref 0.0–149.0)
VLDL: 11 mg/dL (ref 0.0–40.0)

## 2022-02-21 LAB — TSH: TSH: 4.11 u[IU]/mL (ref 0.35–5.50)

## 2022-02-21 LAB — HEMOGLOBIN A1C: Hgb A1c MFr Bld: 8.1 % — ABNORMAL HIGH (ref 4.6–6.5)

## 2022-02-21 LAB — T4, FREE: Free T4: 0.8 ng/dL (ref 0.60–1.60)

## 2022-02-24 ENCOUNTER — Encounter: Payer: Self-pay | Admitting: Endocrinology

## 2022-02-24 ENCOUNTER — Ambulatory Visit: Payer: Medicaid Other | Admitting: Endocrinology

## 2022-02-24 VITALS — BP 132/82 | HR 96 | Ht 68.0 in | Wt 345.6 lb

## 2022-02-24 DIAGNOSIS — Z794 Long term (current) use of insulin: Secondary | ICD-10-CM

## 2022-02-24 DIAGNOSIS — E1165 Type 2 diabetes mellitus with hyperglycemia: Secondary | ICD-10-CM

## 2022-02-24 DIAGNOSIS — E039 Hypothyroidism, unspecified: Secondary | ICD-10-CM | POA: Diagnosis not present

## 2022-02-24 MED ORDER — EMPAGLIFLOZIN 10 MG PO TABS: 10.0000 mg | ORAL_TABLET | Freq: Every day | ORAL | 3 refills | Status: DC

## 2022-02-24 MED ORDER — HUMULIN R U-500 KWIKPEN 500 UNIT/ML ~~LOC~~ SOPN: PEN_INJECTOR | SUBCUTANEOUS | 0 refills | Status: DC

## 2022-02-24 MED ORDER — SEMAGLUTIDE (2 MG/DOSE) 8 MG/3ML ~~LOC~~ SOPN: 2.0000 mg | PEN_INJECTOR | SUBCUTANEOUS | 1 refills | Status: DC

## 2022-02-24 NOTE — Patient Instructions (Addendum)
R insulin 60 in am 80 at lunch and 60 at dinner  If sugar > 200 in daytime go 10 units in am  If sugar > 200 in in am go  up 10 units at dinner  Take 30 min befopre meals  Jardiance: take 1/2 dose of Torsemide with it  Check blood sugars on waking up   Also check blood sugars about 2 hours after meals and do this after different meals by rotation  Recommended blood sugar levels on waking up are 90-130 and about 2 hours after meal is 130-180  Please bring your blood sugar monitor to each visit, thank you

## 2022-02-24 NOTE — Progress Notes (Addendum)
Patient ID: TASHAWNA THOM, female   DOB: 05/14/1971, 51 y.o.   MRN: [id number]           Reason for Appointment: Type II Diabetes follow-up   History of Present Illness   Diagnosis date: 2011  Previous history:  Non-insulin hypoglycemic drugs previously used: Metformin, Victoza Insulin was started in 2014 She has previously used Lantus, Levemir and since about 2019 has used 270 units of NPH once a day  A1c range in the last few years is: 6.4-13.4  Recent history:     Non-insulin hypoglycemic drugs: Victoza 1.8 mg daily, metformin ER 1000 mg twice daily, Trulicity, Farxiga, Byetta     Insulin regimen: NPH insulin 160 in am 120 acl           Side effects from medications: Shortness of breath from [place]  Current self management, blood sugar patterns and problems identified:  A1c is 8.1 and about the same Although she has been recommended taking 280 units of NPH insulin once a day she splits it up and takes 2 turns of the pen in the morning and the rest of the 120 units around lunchtime She thinks she is checking her sugars 3-4 times a day but did not bring any record or her meter Apparently her higher sugars are after dinner She has not tried Ozempic and is taking Victoza She says that when she tried [place] she got short of breath and stopped it a couple of weeks later with resolution of symptoms; she did seem to have better blood sugars when she was doing this Her weight fluctuates and is about the same as in April, current BMI is over 52 Does not think she has any hypoglycemic symptoms or low blood sugars  Exercise: Minimal Diet management: Not following any meal plan     Hypoglycemia:  none    Glucometer:    One Touch Verio      Blood Glucose readings from recall:  PRE-MEAL Fasting Lunch Dinner Bedtime Overall  Glucose range: 150 160 180 200-303   Mean/median:         Dietician visit: Most recent: Years ago     Weight control:  Wt Readings from Last 3  Encounters:  02/24/22 (!) 345 lb 9.6 oz (156.8 kg)  12/22/21 (!) 322 lb 9.6 oz (146.3 kg)  11/22/21 (!) 341 lb 12.8 oz (155 kg)            Diabetes labs:   Lab Results  Component Value Date   HGBA1C 8.1 (H) 02/21/2022   HGBA1C 8.2 (A) 11/22/2021   HGBA1C 8.3 (A) 09/08/2021   Lab Results  Component Value Date   MICROALBUR 7.0 (H) 02/21/2022   LDLCALC 45 02/21/2022   CREATININE 1.03 02/21/2022    Lab Results  Component Value Date   FRUCTOSAMINE 351 (H) 06/18/2021   FRUCTOSAMINE 240 09/18/2015     Allergies as of 02/24/2022       Reactions   Metoclopramide Other (See Comments)   Developed restless leg, akathisia type limb movements.    Codeine Itching, Rash, Other (See Comments)   Hydrocodone Rash   Penicillins Itching   Tolerated Unasyn 02/01/2020   Percocet [oxycodone-acetaminophen] Hives, Rash   Tolerates dilaudid Tolerates acetaminophen         Medication List        Accurate as of February 24, 2022  2:40 PM. If you have any questions, ask your nurse or doctor.  Accu-Chek Aviva Plus test strip Generic drug: glucose blood USE TO TEST BLOOD SUGAR 4 TIMES DAILY   Accu-Chek Guide test strip Generic drug: glucose blood 1 each by Other route 2 (two) times daily. And lancets 2/day   Advair HFA 230-21 MCG/ACT inhaler Generic drug: fluticasone-salmeterol Inhale 1-2 puffs into the lungs 2 (two) times daily.   albuterol (2.5 MG/3ML) 0.083% nebulizer solution Commonly known as: PROVENTIL Take 2.5 mg by nebulization every 6 (six) hours as needed for wheezing.   ALPRAZolam 0.5 MG tablet Commonly known as: XANAX Take 0.5 mg 2 (two) times daily as needed by mouth for anxiety or sleep.   amLODipine 10 MG tablet Commonly known as: NORVASC Take 1 tablet (10 mg total) by mouth daily.   ARIPiprazole 5 MG tablet Commonly known as: ABILIFY Take 5 mg by mouth daily.   Azathioprine 75 MG Tabs Take 150 mg by mouth 2 (two) times daily.   B-D UF III MINI  PEN NEEDLES 31G X 5 MM Misc Generic drug: Insulin Pen Needle USE AS DIRECTED TWICE A DAY   Easy Touch Pen Needles 32G X 4 MM Misc Generic drug: Insulin Pen Needle USE TO INJECT INSULIN TWICE DAILY   Benlysta 200 MG/ML Soaj Generic drug: Belimumab Inject 200 mg into the skin once a week. Thursdays   carvedilol 25 MG tablet Commonly known as: COREG Take 1.5 tablets (37.5 mg total) by mouth 2 (two) times daily with a meal.   cromolyn 4 % ophthalmic solution Commonly known as: OPTICROM Place 1 drop into both eyes 2 (two) times daily.   cycloSPORINE 0.05 % ophthalmic emulsion Commonly known as: RESTASIS Place 1 drop into both eyes in the morning, at noon, and at bedtime.   diclofenac Sodium 1 % Gel Commonly known as: VOLTAREN APPLY 2 GRAMS TO AFFECTED AREA 4 TIMES A DAY   DULoxetine 60 MG capsule Commonly known as: CYMBALTA Take 120 mg by mouth at bedtime.   Eliquis 5 MG Tabs tablet Generic drug: apixaban Take 1 tablet (5 mg total) by mouth 2 (two) times daily.   Entresto 97-103 MG Generic drug: sacubitril-valsartan TAKE 1 TABLET BY MOUTH 2 (TWO) TIMES DAILY.   famotidine 20 MG tablet Commonly known as: PEPCID Take 20 mg by mouth 2 (two) times daily.   fluticasone 50 MCG/ACT nasal spray Commonly known as: FLONASE Place 2 sprays into both nostrils continuous as needed for allergies or rhinitis.   HumuLIN N KwikPen 100 UNIT/ML Kiwkpen Generic drug: Insulin NPH (Human) (Isophane) Inject 280 Units into the skin every morning. And pen needles 1/day   hydroxychloroquine 200 MG tablet Commonly known as: PLAQUENIL Take 200 mg by mouth 2 (two) times daily.   Insulin Syringe-Needle U-100 31G X 5/16" 1 ML Misc Commonly known as: Easy Touch Insulin Syringe USE AS DIRECTED THREE TIMES A DAY   isosorbide-hydrALAZINE 20-37.5 MG tablet Commonly known as: BiDil TAKE TWO (2) TABLETS BY MOUTH THREE TIMES DAILY   levothyroxine 50 MCG tablet Commonly known as: SYNTHROID Take 1  tablet (50 mcg total) by mouth daily at 6 (six) AM.   megestrol 40 MG tablet Commonly known as: MEGACE Take 40 mg by mouth daily.   metFORMIN 500 MG 24 hr tablet Commonly known as: GLUCOPHAGE-XR Take 2 tablets (1,000 mg total) by mouth 2 (two) times daily.   multivitamin with minerals Tabs tablet Take 1 tablet by mouth daily.   onetouch ultrasoft lancets Test 4 times daily DX E11.9   OneTouch Verio w/Device Kit Test  4 times daily.  DX E11.9   oxybutynin 15 MG 24 hr tablet Commonly known as: DITROPAN XL Take 15 mg by mouth daily.   pregabalin 75 MG capsule Commonly known as: LYRICA Take 75 mg 3 (three) times daily by mouth.   QUEtiapine 300 MG tablet Commonly known as: SEROQUEL Take 300 mg by mouth at bedtime.   rosuvastatin 20 MG tablet Commonly known as: CRESTOR Take 1 tablet (20 mg total) by mouth daily.   spironolactone 50 MG tablet Commonly known as: ALDACTONE TAKE 1 TABLET BY MOUTH EVERY DAY   sucralfate 1 GM/10ML suspension Commonly known as: CARAFATE Take 10 mLs by mouth 2 (two) times daily.   topiramate 25 MG tablet Commonly known as: TOPAMAX Take 75 mg by mouth at bedtime.   torsemide 20 MG tablet Commonly known as: DEMADEX Take 1 tablet (20 mg total) by mouth at bedtime.   Transderm-Scop 1 MG/3DAYS Generic drug: scopolamine 1 patch every 3 (three) days.   trimethobenzamide 300 MG capsule Commonly known as: Tigan Take 1 capsule (300 mg total) by mouth 3 (three) times daily as needed for nausea/vomiting. Do not take with the Scopolamine unless nausea is not improving   Victoza 18 MG/3ML Sopn Generic drug: liraglutide Inject 1.8 mg into the skin every morning.        Allergies:  Allergies  Allergen Reactions   Metoclopramide Other (See Comments)    Developed restless leg, akathisia type limb movements.    Codeine Itching, Rash and Other (See Comments)   Hydrocodone Rash   Penicillins Itching    Tolerated Unasyn 02/01/2020   Percocet  [Oxycodone-Acetaminophen] Hives and Rash    Tolerates dilaudid Tolerates acetaminophen     Past Medical History:  Diagnosis Date   Acute renal failure (HCC)     Intractable nausea vomiting secondary to diabetic gastroparesis causing dehydration and acute renal failure /notes 04/01/2013   Anginal pain ([place])    Anxiety    Bell's palsy 08/09/2010   Bicornuate uterus    CAP (community acquired pneumonia) 10/2011   Archie Endo 11/08/2011   Chest pain 01/13/2014   CHF (congestive heart failure) ([place])    Diabetic gastroparesis associated with type 2 diabetes mellitus ([place])    this is presumed diagnoses, not confirmed by any studies.    Eczema    Family history of breast cancer    Family history of malignant neoplasm of breast    Family history of malignant neoplasm of ovary    Family history of ovarian cancer    Family history of prostate cancer    GERD (gastroesophageal reflux disease)    High cholesterol    Hypertension    Liver lesion 08/13/2019   Liver mass 2014   biopsied 03/2013 at [facility], not malignant.  is to undergo radiologic ablation of the mass in sept/October 2014.    Lupus ([place])    Migraines    "maybe a couple times/yr" (04/01/2013)   Obesity    Obstructive sleep apnea on CPAP 2011   Oxygen at nights   SJOGREN'S SYNDROME 08/09/2010   SLE (systemic lupus erythematosus) ([place])    Archie Endo 12/01/2010    Past Surgical History:  Procedure Laterality Date   BREAST CYST EXCISION Left 08/2005   epidermoid   CARDIAC CATHETERIZATION  02/07/2012   ECTOPIC PREGNANCY SURGERY  1999   ESOPHAGOGASTRODUODENOSCOPY N/A 02/26/2013   Procedure: ESOPHAGOGASTRODUODENOSCOPY (EGD);  Surgeon: Irene Shipper, MD;  [facility] ENDOSCOPY;  Service: Endoscopy;  Laterality: N/A;   ESOPHAGOGASTRODUODENOSCOPY  N/A 03/02/2015   Procedure: ESOPHAGOGASTRODUODENOSCOPY (EGD);  Surgeon: Milus Banister, MD;  Location: [place];  Service: Endoscopy;  Laterality: N/A;   ESOPHAGOGASTRODUODENOSCOPY N/A  02/23/2021   Procedure: ESOPHAGOGASTRODUODENOSCOPY (EGD);  Surgeon: Wilford Corner, MD;  Location: [place];  Service: Endoscopy;  Laterality: N/A;  possible dilation   HERNIA REPAIR     LEFT AND RIGHT HEART CATHETERIZATION WITH CORONARY ANGIOGRAM N/A 02/07/2012   Procedure: LEFT AND RIGHT HEART CATHETERIZATION WITH CORONARY ANGIOGRAM;  Surgeon: Laverda Page, MD;  Location: [facility] CATH LAB;  Service: Cardiovascular;  Laterality: N/A;   LIVER BIOPSY  03/2013   liver mass/medical hx noted above   MUSCLE BIOPSY     for lupus/notes 10/06/486   UMBILICAL HERNIA REPAIR  64's    Family History  Problem Relation Age of Onset   Diabetes Mother    Asthma Mother    Urticaria Mother    Hypertension Mother    Stroke Mother    Ovarian cancer Sister 69       maternal half-sister; RAD51C positive   Asthma Brother    Asthma Brother    Breast cancer Maternal Aunt 60       currently 69   Cancer Maternal Aunt        unk. primary   Cancer Maternal Aunt        unk. primary   Ovarian cancer Maternal Aunt        deceased 5s   Breast cancer Maternal Aunt        deceased 83s   Breast cancer Maternal Aunt    Stomach cancer Maternal Uncle        dx 40s; deceased   Prostate cancer Maternal Uncle        deceased 67s   Breast cancer Cousin        deceased 51s; daughter of mat uncle with stomach ca   Hypertension Other    Stroke Other    Arthritis Other    Allergic rhinitis Neg Hx    Angioedema Neg Hx     Social History:  reports that she quit smoking about 30 years ago. Her smoking use included cigarettes. She has a 1.50 pack-year smoking history. She has never used smokeless tobacco. She reports that she does not drink alcohol and does not use drugs.  Review of Systems:  Last diabetic eye exam date unknown  Last foot exam date: 8/22  Symptoms of neuropathy: Have occasional numbness in her feet  Hypertension:   Treatment includes Entresto  BP Readings from Last 3 Encounters:   02/24/22 132/82  12/22/21 131/80  11/22/21 128/90    Lipid management: Controlled with 20 mg of Crestor    Lab Results  Component Value Date   CHOL 93 02/21/2022   CHOL 203 (H) 05/22/2021   CHOL 160 03/06/2014   Lab Results  Component Value Date   HDL 37.00 (L) 02/21/2022   HDL 58 05/22/2021   HDL 57 03/06/2014   Lab Results  Component Value Date   LDLCALC 45 02/21/2022   LDLCALC 117 (H) 05/22/2021   LDLCALC 78 03/06/2014   Lab Results  Component Value Date   TRIG 55.0 02/21/2022   TRIG 138 05/22/2021   TRIG 125 03/06/2014   Lab Results  Component Value Date   CHOLHDL 3 02/21/2022   CHOLHDL 3.5 05/22/2021   CHOLHDL 2.8 03/06/2014   No results found for: "LDLDIRECT"  THYROID: She is on levothyroxine 50 mcg daily for at least a couple of  years with baseline TSH of 7.6 TSH is quite normal now  Lab Results  Component Value Date   TSH 4.11 02/21/2022   TSH 2.871 05/21/2021   TSH 7.630 (H) 08/24/2019   FREET4 0.80 02/21/2022   History of CHF but echocardiogram in 2/23 showed normal functions    Examination:   BP 132/82   Pulse 96   Ht 5' 8"  (1.727 m)   Wt (!) 345 lb 9.6 oz (156.8 kg)   LMP 03/24/2017   SpO2 98%   BMI 52.55 kg/m   Body mass index is 52.55 kg/m.    ASSESSMENT/ PLAN:    Diabetes type 2:   Current regimen: 280 units of NPH insulin daily, metformin and Victoza  See history of present illness for detailed discussion of current diabetes management, blood sugar patterns and problems identified  A1c is 8.1 and about the same as before She does appear to have postprandial hyperglycemia especially after dinner  Blood glucose control is suboptimal with A1c consistently over 8% Also is not getting any physiological regimen of insulin with only very large doses of NPH This is also highly inconvenient as she has to take 4 different injections to get 280 units Not clear if she is benefiting much from Victoza and  metformin Has significant  need for weight loss  Recommendations:  Trial of HUMULIN R insulin instead of NPH To start with we will give her about 200 units total daily in divided doses and then adjust further as needed She can start with 16 units before breakfast 80 at lunch and 60 before dinner Discussed that if she is having high readings within 6-8 hours of any of her doses she will increase the dose by 10 units the next day She will also be given a trial of JARDIANCE 10 mg daily, unclear why she had symptoms of dyspnea with [place] but may be related to blood pressure changes with 10 mg Farxiga She will also check her blood pressure at home while doing this and will check with her cardiologist about reducing her DEMADEX to half a tablet Also encouraged her to maintain good fluid intake She is a good candidate for continuous glucose monitoring and will try to get freestyle libre approved through her insurance, prescription will be sent through the [place] website for this Discussed how freestyle libre sensor is used, benefits, how to use it and to use this to help adjust her insulin as well as modify diet for higher glycemic index foods Consultation with diabetes educator is important for comprehensive education Encouraged her to be as active as possible for exercise Also on third of VICTOZA will switch her to [facility] 2 mg weekly to see if this is better for blood sugar control and weight loss  MILD hypothyroidism, unclear whether this was symptomatic but TSH is about 4 on 50 mcg of levothyroxine and she can continue this  Patient Instructions  R insulin 60 in am 80 at lunch and 60 at dinner  If sugar > 200 in daytime go 10 units in am  If sugar > 200 in in am go  up 10 units at dinner  Take 30 min befopre meals  Jardiance: take 1/2 dose of Torsemide with it    Elayne Snare 02/24/2022, 2:40 PM   Total visit time for evaluation and management, review of relevant records and labs and counseling = 40  minutes

## 2022-02-25 ENCOUNTER — Encounter (HOSPITAL_COMMUNITY): Payer: Self-pay | Admitting: Emergency Medicine

## 2022-02-25 ENCOUNTER — Emergency Department (HOSPITAL_COMMUNITY)
Admission: EM | Admit: 2022-02-25 | Discharge: 2022-02-25 | Disposition: A | Discharge status: Home / Self Care | Payer: Medicaid Other | Attending: Emergency Medicine | Admitting: Emergency Medicine

## 2022-02-25 ENCOUNTER — Other Ambulatory Visit: Payer: Self-pay

## 2022-02-25 ENCOUNTER — Emergency Department (HOSPITAL_COMMUNITY): Payer: Medicaid Other

## 2022-02-25 ENCOUNTER — Other Ambulatory Visit: Payer: Self-pay | Admitting: Endocrinology

## 2022-02-25 DIAGNOSIS — Z79899 Other long term (current) drug therapy: Secondary | ICD-10-CM | POA: Insufficient documentation

## 2022-02-25 DIAGNOSIS — R002 Palpitations: Secondary | ICD-10-CM | POA: Diagnosis present

## 2022-02-25 DIAGNOSIS — D649 Anemia, unspecified: Secondary | ICD-10-CM | POA: Insufficient documentation

## 2022-02-25 DIAGNOSIS — E119 Type 2 diabetes mellitus without complications: Secondary | ICD-10-CM | POA: Insufficient documentation

## 2022-02-25 DIAGNOSIS — Z794 Long term (current) use of insulin: Secondary | ICD-10-CM | POA: Insufficient documentation

## 2022-02-25 DIAGNOSIS — Z7901 Long term (current) use of anticoagulants: Secondary | ICD-10-CM

## 2022-02-25 DIAGNOSIS — R600 Localized edema: Secondary | ICD-10-CM | POA: Insufficient documentation

## 2022-02-25 DIAGNOSIS — I110 Hypertensive heart disease with heart failure: Secondary | ICD-10-CM | POA: Insufficient documentation

## 2022-02-25 DIAGNOSIS — I5040 Unspecified combined systolic (congestive) and diastolic (congestive) heart failure: Secondary | ICD-10-CM | POA: Diagnosis not present

## 2022-02-25 DIAGNOSIS — R0789 Other chest pain: Secondary | ICD-10-CM

## 2022-02-25 DIAGNOSIS — Z7984 Long term (current) use of oral hypoglycemic drugs: Secondary | ICD-10-CM | POA: Insufficient documentation

## 2022-02-25 LAB — COMPREHENSIVE METABOLIC PANEL
ALT: 11 U/L (ref 0–44)
AST: 11 U/L — ABNORMAL LOW (ref 15–41)
Albumin: 3.5 g/dL (ref 3.5–5.0)
Alkaline Phosphatase: 74 U/L (ref 38–126)
Anion gap: 6 (ref 5–15)
BUN: 22 mg/dL — ABNORMAL HIGH (ref 6–20)
CO2: 19 mmol/L — ABNORMAL LOW (ref 22–32)
Calcium: 9.2 mg/dL (ref 8.9–10.3)
Chloride: 114 mmol/L — ABNORMAL HIGH (ref 98–111)
Creatinine, Ser: 1.19 mg/dL — ABNORMAL HIGH (ref 0.44–1.00)
GFR, Estimated: 55 mL/min — ABNORMAL LOW (ref >60)
Glucose, Bld: 210 mg/dL — ABNORMAL HIGH (ref 70–99)
Potassium: 4.1 mmol/L (ref 3.5–5.1)
Sodium: 139 mmol/L (ref 135–145)
Total Bilirubin: 0.5 mg/dL (ref 0.3–1.2)
Total Protein: 6.6 g/dL (ref 6.5–8.1)

## 2022-02-25 LAB — CBC WITH DIFFERENTIAL/PLATELET
Abs Immature Granulocytes: 0.02 10*3/uL (ref 0.00–0.07)
Basophils Absolute: 0 10*3/uL (ref 0.0–0.1)
Basophils Relative: 0 %
Eosinophils Absolute: 0.6 10*3/uL — ABNORMAL HIGH (ref 0.0–0.5)
Eosinophils Relative: 7 %
HCT: 31.1 % — ABNORMAL LOW (ref 36.0–46.0)
Hemoglobin: 10 g/dL — ABNORMAL LOW (ref 12.0–15.0)
Immature Granulocytes: 0 %
Lymphocytes Relative: 30 %
Lymphs Abs: 2.7 10*3/uL (ref 0.7–4.0)
MCH: 28.9 pg (ref 26.0–34.0)
MCHC: 32.2 g/dL (ref 30.0–36.0)
MCV: 89.9 fL (ref 80.0–100.0)
Monocytes Absolute: 0.6 10*3/uL (ref 0.1–1.0)
Monocytes Relative: 7 %
Neutro Abs: 5 10*3/uL (ref 1.7–7.7)
Neutrophils Relative %: 56 %
Platelets: 238 10*3/uL (ref 150–400)
RBC: 3.46 MIL/uL — ABNORMAL LOW (ref 3.87–5.11)
RDW: 14.4 % (ref 11.5–15.5)
WBC: 8.9 10*3/uL (ref 4.0–10.5)
nRBC: 0 % (ref 0.0–0.2)

## 2022-02-25 LAB — MAGNESIUM: Magnesium: 1.9 mg/dL (ref 1.7–2.4)

## 2022-02-25 LAB — TROPONIN I (HIGH SENSITIVITY): Troponin I (High Sensitivity): 4 ng/L (ref <18)

## 2022-02-25 LAB — BRAIN NATRIURETIC PEPTIDE: B Natriuretic Peptide: 14.5 pg/mL (ref 0.0–100.0)

## 2022-02-25 NOTE — ED Provider Notes (Signed)
[facility] EMERGENCY DEPARTMENT Provider Note   CSN: [id number] Arrival date & time: 02/25/22  0015     History  Chief Complaint  Patient presents with   Palpitations    [name] is a 51 y.o. female.  The history is provided by the patient.  Palpitations She has history of hypertension, diabetes, hyperlipidemia, combined systolic and diastolic heart failure, pulmonary embolism anticoagulated on apixaban, lupus, stroke and comes in because of a sense of fluttering in her chest and a pressure feeling in her chest.  Symptoms have been present for 2 weeks and have been waxing and waning but have not gone away completely at any time.  Symptoms got worse after she had a CT scan with contrast yesterday.  She denies change in baseline dyspnea.  She denies nausea or diaphoresis.  Symptoms were not exertional in nature.  She has not done anything to treat her symptoms.  Of note, she does take torsemide for edema, but has noted her legs swelling recently.   Home Medications Prior to Admission medications   Medication Sig Start Date End Date Taking? Authorizing Provider  ACCU-CHEK AVIVA PLUS test strip USE TO TEST BLOOD SUGAR 4 TIMES DAILY 05/05/21   Renato Shin, MD  ADVAIR [facility] 230-21 MCG/ACT inhaler Inhale 1-2 puffs into the lungs 2 (two) times daily. 12/20/20   [provider]  albuterol (PROVENTIL) (2.5 MG/3ML) 0.083% nebulizer solution Take 2.5 mg by nebulization every 6 (six) hours as needed for wheezing.    [provider]  ALPRAZolam Duanne Moron) 0.5 MG tablet Take 0.5 mg 2 (two) times daily as needed by mouth for anxiety or sleep.  04/19/17   [provider]  amLODipine (NORVASC) 10 MG tablet Take 1 tablet (10 mg total) by mouth daily. 05/27/21   Jose Persia, MD  apixaban (ELIQUIS) 5 MG TABS tablet Take 1 tablet (5 mg total) by mouth 2 (two) times daily. 05/27/21   Jose Persia, MD  ARIPiprazole (ABILIFY) 5 MG tablet Take 5 mg by mouth  daily. 12/08/20   [provider]  Azathioprine 75 MG TABS Take 150 mg by mouth 2 (two) times daily. 02/04/21   [provider]  B-D UF III MINI PEN NEEDLES 31G X 5 MM MISC USE AS DIRECTED TWICE A DAY 10/31/18   Renato Shin, MD  Belimumab (BENLYSTA) 200 MG/ML SOAJ Inject 200 mg into the skin once a week. Thursdays 12/19/19   [provider]  Blood Glucose Monitoring Suppl (ONETOUCH VERIO) w/Device KIT Test 4 times daily.  DX E11.9 04/27/20   Elayne Snare, MD  carvedilol (COREG) 25 MG tablet Take 1.5 tablets (37.5 mg total) by mouth 2 (two) times daily with a meal. 01/01/21   Adrian Prows, MD  cromolyn (OPTICROM) 4 % ophthalmic solution Place 1 drop into both eyes 2 (two) times daily. 01/14/21   [provider]  cycloSPORINE (RESTASIS) 0.05 % ophthalmic emulsion Place 1 drop into both eyes in the morning, at noon, and at bedtime.     [provider]  diclofenac Sodium (VOLTAREN) 1 % GEL APPLY 2 GRAMS TO AFFECTED AREA 4 TIMES A DAY 01/11/22   Cantwell, Celeste C, PA-C  DULoxetine (CYMBALTA) 60 MG capsule Take 120 mg by mouth at bedtime. 08/31/20   [provider]  EASY TOUCH PEN NEEDLES 32G X 4 MM MISC USE TO INJECT INSULIN TWICE DAILY 08/07/19   Renato Shin, MD  empagliflozin (JARDIANCE) 10 MG TABS tablet Take 1 tablet (10 mg  total) by mouth daily with breakfast. 02/24/22   Elayne Snare, MD  ENTRESTO 97-103 MG TAKE 1 TABLET BY MOUTH 2 (TWO) TIMES DAILY. Patient taking differently: Take 1 tablet by mouth 2 (two) times daily. 04/09/21   Adrian Prows, MD  famotidine (PEPCID) 20 MG tablet Take 20 mg by mouth 2 (two) times daily.    [provider]  fluticasone (FLONASE) 50 MCG/ACT nasal spray Place 2 sprays into both nostrils continuous as needed for allergies or rhinitis.    [provider]  glucose blood (ACCU-CHEK GUIDE) test strip 1 each by Other route 2 (two) times daily. And lancets 2/day 06/18/21   Renato Shin, MD  hydroxychloroquine  (PLAQUENIL) 200 MG tablet Take 200 mg by mouth 2 (two) times daily.    [provider]  insulin regular human CONCENTRATED (HUMULIN R U-500 KWIKPEN) 500 UNIT/ML KwikPen 100 Units, 30 minutes before each meal 02/24/22   Elayne Snare, MD  Insulin Syringe-Needle U-100 (EASY TOUCH INSULIN SYRINGE) 31G X 5/16" 1 ML MISC USE AS DIRECTED THREE TIMES A DAY 10/12/18   Renato Shin, MD  isosorbide-hydrALAZINE (BIDIL) 20-37.5 MG tablet TAKE TWO (2) TABLETS BY MOUTH THREE TIMES DAILY 10/21/21   Cantwell, Celeste C, PA-C  Lancets (ONETOUCH ULTRASOFT) lancets Test 4 times daily DX E11.9 04/27/20   Elayne Snare, MD  levothyroxine (SYNTHROID) 50 MCG tablet Take 1 tablet (50 mcg total) by mouth daily at 6 (six) AM. 08/30/19   Sheikh, Georgina Quint Latif, DO  liraglutide (VICTOZA) 18 MG/3ML SOPN Inject 1.8 mg into the skin every morning. 01/12/22   Elayne Snare, MD  megestrol (MEGACE) 40 MG tablet Take 40 mg by mouth daily. 01/30/15   [provider]  metFORMIN (GLUCOPHAGE-XR) 500 MG 24 hr tablet Take 2 tablets (1,000 mg total) by mouth 2 (two) times daily. 02/02/21   Renato Shin, MD  Multiple Vitamin (MULTIVITAMIN WITH MINERALS) TABS tablet Take 1 tablet by mouth daily. 08/30/19   Raiford Noble Latif, DO  oxybutynin (DITROPAN XL) 15 MG 24 hr tablet Take 15 mg by mouth daily.  08/30/17   [provider]  pregabalin (LYRICA) 75 MG capsule Take 75 mg 3 (three) times daily by mouth.  01/28/15   [provider]  QUEtiapine (SEROQUEL) 300 MG tablet Take 300 mg by mouth at bedtime. 01/08/21   [provider]  rosuvastatin (CRESTOR) 20 MG tablet Take 1 tablet (20 mg total) by mouth daily. 06/09/21 09/20/21  Cantwell, Celeste C, PA-C  Semaglutide, 2 MG/DOSE, 8 MG/3ML SOPN Inject 2 mg as directed once a week. 02/24/22   Elayne Snare, MD  spironolactone (ALDACTONE) 50 MG tablet TAKE 1 TABLET BY MOUTH EVERY DAY 02/04/22   Adrian Prows, MD  sucralfate (CARAFATE) 1 GM/10ML suspension Take 10 mLs by mouth 2 (two)  times daily. 12/09/20   [provider]  topiramate (TOPAMAX) 25 MG tablet Take 75 mg by mouth at bedtime. 03/17/16   [provider]  torsemide (DEMADEX) 20 MG tablet Take 1 tablet (20 mg total) by mouth at bedtime. 06/09/21   Cantwell, Celeste C, PA-C  TRANSDERM-SCOP 1 MG/3DAYS 1 patch every 3 (three) days. 12/22/21   [provider]  trimethobenzamide (TIGAN) 300 MG capsule Take 1 capsule (300 mg total) by mouth 3 (three) times daily as needed for nausea/vomiting. Do not take with the Scopolamine unless nausea is not improving 05/27/21 05/27/22  Jose Persia, MD      Allergies    Metoclopramide, Codeine, Hydrocodone, Penicillins, and Percocet [oxycodone-acetaminophen]  Review of Systems   Review of Systems  Cardiovascular:  Positive for palpitations.  All other systems reviewed and are negative.   Physical Exam Updated Vital Signs BP 109/78   Pulse 89   Resp 18   LMP 03/24/2017   SpO2 100%  Physical Exam Vitals and nursing note reviewed.   51 year old female, resting comfortably and in no acute distress. Vital signs are normal. Oxygen saturation is 100%, which is normal. Head is normocephalic and atraumatic. PERRLA, EOMI. Oropharynx is clear. Neck is nontender and supple without adenopathy or JVD. Back is nontender and there is no CVA tenderness. Lungs are clear without rales, wheezes, or rhonchi. Chest is nontender. Heart has regular rate and rhythm without murmur. Abdomen is soft, flat, nontender. Extremities have 2+ edema, full range of motion is present. Skin is warm and dry without rash. Neurologic: Mental status is normal, cranial nerves are intact, moves all extremities equally.  ED Results / Procedures / Treatments   Labs (all labs ordered are listed, but only abnormal results are displayed) Labs Reviewed  COMPREHENSIVE METABOLIC PANEL  CBC WITH DIFFERENTIAL/PLATELET  MAGNESIUM  TROPONIN I (HIGH SENSITIVITY)    EKG EKG  Interpretation  Date/Time:  Friday February 25 2022 00:25:22 EDT Ventricular Rate:  87 PR Interval:  162 QRS Duration: 90 QT Interval:  429 QTC Calculation: 517 R Axis:   32 Text Interpretation: Sinus rhythm Low voltage, precordial leads Borderline T abnormalities, anterior leads Prolonged QT interval When compared with ECG of 07/26/2021, has changed has decreased QT has lengthened Confirmed by Delora Fuel ([id number]) on 02/25/2022 12:29:39 AM  Radiology No results found.  Procedures Procedures  Cardiac monitor shows normal sinus rhythm without ectopy, per my interpretation.  Medications Ordered in ED Medications - No data to display  ED Course/ Medical Decision Making/ A&P                           Medical Decision Making Amount and/or Complexity of Data Reviewed Labs: ordered. Radiology: ordered.   Subjective fluttering in the chest of uncertain cause.  I doubt actual arrhythmia because patient states that she is having fluttering in her chest currently well monitor showing normal sinus rhythm.  Consider possible angina equivalent, but constant pain over 2 weeks would be most atypical.  Doubt pulmonary embolism in the setting of chronic anticoagulation.  I have reviewed and interpreted her ECG, and my interpretation is sinus rhythm without ectopy with borderline T wave flattening, prolonged QT interval with QT prolongation compared with prior ECG.  Because of QT prolongation, I have ordered magnesium level in addition to CBC, comprehensive metabolic panel, troponin.  Because symptoms have been present for 2 weeks, a single troponin is all that will be necessary.  Old records are reviewed, and I do note cardiology office visit on 09/20/2021, and exam note specifically states no edema.  I have ordered a chest x-ray to evaluate possible heart failure, will also order BNP.  I have reviewed and interpreted her laboratory tests and my interpretation is mild renal insufficiency with creatinine not  significantly changed from baseline, normal potassium and magnesium, normal BNP, normal troponin, mild anemia unchanged from baseline.  I have advised the patient of these findings.  At this point, I explained to her that I do not see any sign of any serious heart problems and I have informed her that she needs to follow-up with her cardiologist.  She now tells me that  she has been using albuterol nebulizer 4 times a day even when she is not having any respiratory symptoms.  I have reviewed her hospital records, and albuterol was prescribed upon discharge from the hospital on 05/27/2021, but it was ordered every 6 hours as needed.  Patient is advised to not use albuterol unless she is having difficulty breathing or coughing.  It is possible that she is actually getting toxic on albuterol to account for some of her symptoms.  Final Clinical Impression(s) / ED Diagnoses Final diagnoses:  Palpitations  Atypical chest pain  Chronic anticoagulation    Rx / DC Orders ED Discharge Orders     None         Delora Fuel, MD 37/48/27 [phone]

## 2022-02-25 NOTE — ED Triage Notes (Signed)
Pt brought to ED by [facility] with c/o "heart fluttering x1 week". Pt has hx of CHF. Denies shortness of breath.   EMS Vitals BP 138/72 HR 90 RR 16 SPO2 98 CBG 223

## 2022-02-25 NOTE — Discharge Instructions (Addendum)
Your evaluation today did not show any sign of any problems with your heart.  Please follow-up with your cardiologist for further evaluation.  You need to continue taking Advair twice a day, every day.  However, your albuterol should only be used if you are having some problems with breathing or coughing.  It is possible that taking albuterol when you do not need it may be contributing to some of your symptoms.  Please return to the emergency department if you have any new or concerning symptoms.

## 2022-03-02 ENCOUNTER — Telehealth: Payer: Self-pay | Admitting: Endocrinology

## 2022-03-02 NOTE — Telephone Encounter (Signed)
Please let her know that I have discussed her using torsemide with Jardiance and the cardiologist said that she can take reduce it to 3 times a week and then if no swelling take it as needed only

## 2022-03-03 ENCOUNTER — Telehealth: Payer: Self-pay

## 2022-03-03 ENCOUNTER — Other Ambulatory Visit (HOSPITAL_COMMUNITY): Payer: Self-pay

## 2022-03-03 NOTE — Telephone Encounter (Signed)
Patient Advocate Encounter   Received notification from [facility] that prior authorization is required for Jardiance '10MG'$ . PA submitted and APPROVED on 03/03/22.  Key: [id number]  Effective: 03/03/2022 - 03/03/2023  Clista Bernhardt, CPhT Rx Patient Advocate Specialist Phone: [phone]

## 2022-03-09 ENCOUNTER — Inpatient Hospital Stay: Payer: Medicaid Other | Attending: Physician Assistant

## 2022-03-14 ENCOUNTER — Encounter: Payer: Self-pay | Admitting: Physician Assistant

## 2022-03-16 NOTE — Therapy (Addendum)
OUTPATIENT PHYSICAL THERAPY LOWER EXTREMITY EVALUATION   Patient Name: [name] MRN: [id number] DOB:07/30/1971, 51 y.o., female Today's Date: 03/22/2022   PT End of Session - 03/22/22 1351     Visit Number 1    Number of Visits 4    Date for PT Re-Evaluation 05/22/22    Authorization Type [employer]    Authorization - Number of Visits 4    PT Start Time 1130    PT Stop Time 1215    PT Time Calculation (min) 45 min    Activity Tolerance Patient tolerated treatment well    Behavior During Therapy [facility] for tasks assessed/performed             Past Medical History:  Diagnosis Date   Acute renal failure ([place])     Intractable nausea vomiting secondary to diabetic gastroparesis causing dehydration and acute renal failure /notes 04/01/2013   Anginal pain ([place])    Anxiety    Bell's palsy 08/09/2010   Bicornuate uterus    CAP (community acquired pneumonia) 10/2011   Archie Endo 11/08/2011   Chest pain 01/13/2014   CHF (congestive heart failure) ([place])    Diabetic gastroparesis associated with type 2 diabetes mellitus ([place])    this is presumed diagnoses, not confirmed by any studies.    Eczema    Family history of breast cancer    Family history of malignant neoplasm of breast    Family history of malignant neoplasm of ovary    Family history of ovarian cancer    Family history of prostate cancer    GERD (gastroesophageal reflux disease)    High cholesterol    Hypertension    Liver lesion 08/13/2019   Liver mass 2014   biopsied 03/2013 at [facility], not malignant.  is to undergo radiologic ablation of the mass in sept/October 2014.    Lupus ([place])    Migraines    "maybe a couple times/yr" (04/01/2013)   Obesity    Obstructive sleep apnea on CPAP 2011   Oxygen at nights   SJOGREN'S SYNDROME 08/09/2010   SLE (systemic lupus erythematosus) ([place])    Archie Endo 12/01/2010   Past Surgical History:  Procedure Laterality Date   BREAST CYST EXCISION Left 08/2005   epidermoid    CARDIAC CATHETERIZATION  02/07/2012   ECTOPIC PREGNANCY SURGERY  1999   ESOPHAGOGASTRODUODENOSCOPY N/A 02/26/2013   Procedure: ESOPHAGOGASTRODUODENOSCOPY (EGD);  Surgeon: Irene Shipper, MD;  Location: [facility] ENDOSCOPY;  Service: Endoscopy;  Laterality: N/A;   ESOPHAGOGASTRODUODENOSCOPY N/A 03/02/2015   Procedure: ESOPHAGOGASTRODUODENOSCOPY (EGD);  Surgeon: Milus Banister, MD;  Location: [place];  Service: Endoscopy;  Laterality: N/A;   ESOPHAGOGASTRODUODENOSCOPY N/A 02/23/2021   Procedure: ESOPHAGOGASTRODUODENOSCOPY (EGD);  Surgeon: Wilford Corner, MD;  Location: [place];  Service: Endoscopy;  Laterality: N/A;  possible dilation   HERNIA REPAIR     LEFT AND RIGHT HEART CATHETERIZATION WITH CORONARY ANGIOGRAM N/A 02/07/2012   Procedure: LEFT AND RIGHT HEART CATHETERIZATION WITH CORONARY ANGIOGRAM;  Surgeon: Laverda Page, MD;  Location: [facility] CATH LAB;  Service: Cardiovascular;  Laterality: N/A;   LIVER BIOPSY  03/2013   liver mass/medical hx noted above   MUSCLE BIOPSY     for lupus/notes 08/04/4816   UMBILICAL HERNIA REPAIR  1980's   Patient Active Problem List   Diagnosis Date Noted   Vitamin B12 deficiency 01/04/2022   History of pulmonary embolism 12/22/2021   Normocytic anemia 12/22/2021   Foot pain, bilateral 11/22/2021   Genetic testing 08/17/2021  Family history of breast cancer 07/19/2021   Family history of prostate cancer 07/19/2021   Esophageal dysmotility 06/03/2021   Pulmonary embolism ([place]) 05/21/2021   Congestive heart failure ([place]) 09/28/2020   Physical debility 08/29/2019   Obesity hypoventilation syndrome ([place]) 08/23/2019   Skin breakdown 08/23/2019   Essential hypertension 08/23/2019   Liver hemorrhage 08/23/2019   CVA (cerebral vascular accident) ([place]) 08/19/2019   Major depressive disorder with single episode, in partial remission ([place]) 08/19/2019   Morbid obesity with BMI of 50.0-59.9, adult ([place]) 08/19/2019   SLE (systemic lupus erythematosus  related syndrome) ([place]) 08/19/2019   Hepatic adenoma    Sciatica 04/20/2018   Type 2 diabetes mellitus with hyperlipidemia ([place]) 09/19/2015   Radiculopathy of cervical spine    Family history of malignant neoplasm of breast    Family history of malignant neoplasm of ovary    Intractable nausea and vomiting 11/11/2013   Septate uterus 09/08/2013   Diabetic gastroparesis ([place]) 04/28/2013   Constipation due to pain medication 04/21/2013   Odynophagia 04/04/2013   Liver masses 02/28/2013   Reflux esophagitis 02/26/2013   Morbid obesity ([place]) 10/10/2012   Chronic diastolic CHF (congestive heart failure) ([place]) 10/09/2012   Clinical polymyositis syndrome ([place]) 09/06/2012   Mononeuritis of lower limb 01/12/2012   Hypertension complicating diabetes ([place]) 07/68/0881   Chronic systolic CHF (congestive heart failure) ([place]) 11/08/2011   HLD (hyperlipidemia) 07/19/2011   Bell's palsy 08/09/2010   SLE 08/09/2010   SJOGREN'S SYNDROME 08/09/2010    PCP: Bartholome Bill, MD  REFERRING PROVIDER: Girard Cooter, MD  REFERRING DIAG: acute pain left knee   THERAPY DIAG: acute pain left knee, medial menisectomy   Rationale for Evaluation and Treatment Rehabilitation  ONSET DATE: April 2023  SUBJECTIVE:   SUBJECTIVE STATEMENT: Fell in April and injured L knee, dx'ed with medial meniscus tear, cortisone injection on 03/07/22 which provided some relief  PERTINENT HISTORY: Lupus   PAIN:  Are you having pain? Yes: NPRS scale: 10/10 Pain location: L medial knee Pain description: Throbbing Aggravating factors: change of direction and unexpected Relieving factors: rest  PRECAUTIONS: None  WEIGHT BEARING RESTRICTIONS No  FALLS:  Has patient fallen in last 6 months? Yes. Number of falls 1  LIVING ENVIRONMENT: Lives with: lives with their family Lives in: House/apartment Stairs:  yes Has following equipment at home: Single point cane  OCCUPATION: not working  PLOF:  Independent  PATIENT GOALS To reduce and manage my knee pain   OBJECTIVE:   DIAGNOSTIC FINDINGS: 02/11/2022 5:31 PM EDT    1.  Complex tears of the anterior horns and bodies of the medial and lateral menisci. 2.  Small joint effusion with synovitis. 3.  High-grade chondrosis along the weightbearing surfaces of the medial and lateral compartments. 4.  Mild edema within the posterior compartment musculature, nonspecific.  PATIENT SURVEYS:  LEFS 24/80  COGNITION:  Overall cognitive status: Within functional limits for tasks assessed     SENSATION: WFL  EDEMA:  Trace effusion suspected  MUSCLE LENGTH: Hamstrings: Right WFL B  POSTURE:  unremarkable   PALPATION: Tender to medial and lateral joint lines   LOWER EXTREMITY ROM:  A/PROM Right eval Left eval  Hip flexion 90 90  Hip extension    Hip abduction    Hip adduction    Hip internal rotation    Hip external rotation    Knee flexion 110d 105d  Knee extension +5d -5d/+5d  Ankle dorsiflexion    Ankle plantarflexion    Ankle  inversion    Ankle eversion     (Blank rows = not tested)  LOWER EXTREMITY MMT:  MMT Right eval Left eval  Hip flexion 4 4  Hip extension 4 4  Hip abduction 4 4  Hip adduction    Hip internal rotation    Hip external rotation    Knee flexion 4 4  Knee extension 4 4-  Ankle dorsiflexion    Ankle plantarflexion 4 4  Ankle inversion    Ankle eversion     (Blank rows = not tested)  LOWER EXTREMITY SPECIAL TESTS:  Knee special tests: stable to varus/valgus stressing  FUNCTIONAL TESTS:  5 times sit to stand: 30s arms crossed  GAIT: Distance walked: 25fx2 Assistive device utilized: Single point cane Level of assistance: Modified independence Comments: antalgic gait pattern    TODAY'S TREATMENT: Eval   PATIENT EDUCATION:  Education details: Discussed eval findings, rehab rationale and POC and patient is in agreement  Person educated: Patient Education method:  Explanation and Handouts Education comprehension: verbalized understanding and needs further education   HOME EXERCISE PROGRAM: Access Code: [email or web address] URL: [email or web address] Date: 03/22/2022 Prepared by: JSharlynn Oliphant Exercises - Supine Quad Set  - 2 x daily - 5 x weekly - 2 sets - 10 reps - 3s hold - Supine Straight Leg Raises  - 2 x daily - 5 x weekly - 2 sets - 10 reps - Supine Knee Extension Strengthening  - 2 x daily - 5 x weekly - 2 sets - 10 reps - Seated Heel Slide  - 2 x daily - 5 x weekly - 2 sets - 10 reps  ASSESSMENT:  CLINICAL IMPRESSION: Patient is a 51y.o. female who was seen today for physical therapy evaluation and treatment for L knee pain following a traumatic fall resulting in medial and lateral meniscus tears. She demos good AROM in L knee, overall pain levels are minimal, quad weakness present and trace joint effusion recognized.  5x STS time 30s arms crossd.   OBJECTIVE IMPAIRMENTS decreased activity tolerance, decreased knowledge of condition, difficulty walking, decreased ROM, decreased strength, and pain.   ACTIVITY LIMITATIONS lifting, squatting, and stairs  PERSONAL FACTORS Fitness, Past/current experiences, Time since onset of injury/illness/exacerbation, and 1 comorbidity: Lupus  are also affecting patient's functional outcome.   REHAB POTENTIAL: Fair based on meniscal pathology  CLINICAL DECISION MAKING: Stable/uncomplicated  EVALUATION COMPLEXITY: Low   GOALS: Goals reviewed with patient? Yes  SHORT TERM GOALS=LONG TERM GOALS: Target date: 04/19/2022  Patient to demonstrate independence in HEP  Baseline:[id number] Goal status: INITIAL  2.  Decrease average pain to 4/10 Baseline: 10/10 worst pain, 6/10 average pain level Goal status: INITIAL  3.  Increase L knee extensor strength to 4/5 Baseline: 4-/5 Goal status: INITIAL  4.  Increase AROM L knee to 0d Baseline: L knee active extension -5d Goal status: INITIAL  5.   Increase LEFS score to 34/80 Baseline: 24/80 Goal status: INITIAL   PLAN: PT FREQUENCY: 1x/week  PT DURATION: 4 weeks  PLANNED INTERVENTIONS: Therapeutic exercises, Therapeutic activity, Neuromuscular re-education, Balance training, Gait training, Patient/Family education, Self Care, Joint mobilization, Stair training, DME instructions, Taping, Manual therapy, and Re-evaluation  PLAN FOR NEXT SESSION: HEP review and update, CKC tasks, ROM, LE strengthening   JLanice Shirts PT 03/22/2022, 3:49 PM   Check all possible CPT codes: 97164 - PT Re-evaluation, 97110- Therapeutic Exercise, [phone] Neuro Re-education, [phone]- Gait Training, [phone]- Manual Therapy, and 97530 - Therapeutic Activities  If treatment provided at initial evaluation, no treatment charged due to lack of authorization.

## 2022-03-21 ENCOUNTER — Ambulatory Visit: Payer: Medicaid Other | Admitting: Student

## 2022-03-21 ENCOUNTER — Encounter: Payer: Self-pay | Admitting: Cardiology

## 2022-03-21 ENCOUNTER — Ambulatory Visit: Payer: Medicaid Other | Admitting: Cardiology

## 2022-03-21 VITALS — BP 140/82 | HR 98 | Temp 97.8°F | Resp 16 | Ht 68.0 in | Wt 340.0 lb

## 2022-03-21 DIAGNOSIS — I5032 Chronic diastolic (congestive) heart failure: Secondary | ICD-10-CM

## 2022-03-21 DIAGNOSIS — I10 Essential (primary) hypertension: Secondary | ICD-10-CM

## 2022-03-21 NOTE — Progress Notes (Signed)
Primary Physician:  Bartholome Bill, MD   Patient ID: [name], female    DOB: 1970-09-07, 51 y.o.   MRN: [id number]  CC:  Chief Complaint  Patient presents with   Hypertension   Congestive Heart Failure   Follow-up    6 months   Subjective:   HPI: [name] is a 51 y.o. female female  with Sjogren's syndrome, SLE, nonischemic cardiomyopathy, severe LVEF in 2018, and again in 2021, with intermittent improvement in LVEF by echo, last echocardiogram in February 2023 revealing preserved LVEF,  morbid obesity, obesity hypoventilation syndrome, uncontrolled type 2 DM, hypertension, h/o CVA in 2014, OSA on CPAP and hepatic  adenoma s/p embolization on 08/12/19.  She also developed bilateral pulmonary emboli within the proximal segment pulmonary arterioles with severe clot burden and evidence of RV strain consistent with submassive PE in September 2022 when she was admitted with pneumonia.  She has recuperated well, has lost about 37 Lbs since Covid infection and has maintained weight loss. Chronic dyspnea has remained stable. No leg edema.   Past Medical History:  Diagnosis Date   Acute renal failure (HCC)     Intractable nausea vomiting secondary to diabetic gastroparesis causing dehydration and acute renal failure /notes 04/01/2013   Anginal pain ([place])    Anxiety    Bell's palsy 08/09/2010   Bicornuate uterus    CAP (community acquired pneumonia) 10/2011   Archie Endo 11/08/2011   Chest pain 01/13/2014   CHF (congestive heart failure) ([place])    Diabetic gastroparesis associated with type 2 diabetes mellitus ([place])    this is presumed diagnoses, not confirmed by any studies.    Eczema    Family history of breast cancer    Family history of malignant neoplasm of breast    Family history of malignant neoplasm of ovary    Family history of ovarian cancer    Family history of prostate cancer    GERD (gastroesophageal reflux disease)    High cholesterol    Hypertension    Liver  lesion 08/13/2019   Liver mass 2014   biopsied 03/2013 at [facility], not malignant.  is to undergo radiologic ablation of the mass in sept/October 2014.    Lupus ([place])    Migraines    "maybe a couple times/yr" (04/01/2013)   Obesity    Obstructive sleep apnea on CPAP 2011   Oxygen at nights   SJOGREN'S SYNDROME 08/09/2010   SLE (systemic lupus erythematosus) ([place])    Archie Endo 12/01/2010    Social History   Tobacco Use   Smoking status: Former    Packs/day: 0.50    Years: 3.00    Total pack years: 1.50    Types: Cigarettes    Quit date: 06/02/1991    Years since quitting: 30.8   Smokeless tobacco: Never   Tobacco comments:    quit 28 yrs ago  Substance Use Topics   Alcohol use: No    Alcohol/week: 0.0 standard drinks of alcohol   Marital Status: Single   Review of Systems  Cardiovascular:  Positive for dyspnea on exertion. Negative for chest pain, leg swelling, orthopnea, palpitations and paroxysmal nocturnal dyspnea.  Musculoskeletal:  Positive for arthritis and back pain.  Gastrointestinal:  Negative for melena.   Objective:  Blood pressure (!) 140/82, pulse 98, temperature 97.8 F (36.6 C), temperature source Temporal, resp. rate 16, height 5' 8"  (1.727 m), weight (!) 340 lb (154.2 kg), last menstrual period 03/24/2017, SpO2 98 %. Body  mass index is 51.7 kg/m.      03/21/2022    2:36 PM 03/21/2022    2:20 PM 02/25/2022    3:51 AM  Vitals with BMI  Height  5' 8"    Weight  340 lbs   BMI  77.37   Systolic 366 97 815  Diastolic 82 62 70  Pulse 98 101 85   Physical Exam Constitutional:      Appearance: She is well-developed.     Comments: Morbidly obese  Neck:     Vascular: No carotid bruit or JVD.     Comments: Short neck and difficult to evaluate JVP Cardiovascular:     Rate and Rhythm: Regular rhythm. Tachycardia present.     Pulses: Intact distal pulses.          Carotid pulses are 2+ on the right side and 2+ on the left side.    Heart sounds: Normal  heart sounds. No murmur heard.    No gallop.  Pulmonary:     Effort: Pulmonary effort is normal.     Breath sounds: Normal breath sounds.  Abdominal:     General: Bowel sounds are normal.     Palpations: Abdomen is soft.     Comments: Obese. Pannus present  Musculoskeletal:     Right lower leg: No edema.     Left lower leg: No edema.    Laboratory examination:      Latest Ref Rng & Units 02/25/2022    1:44 AM 02/21/2022   11:04 AM 12/22/2021   10:52 AM  CMP  Glucose 70 - 99 mg/dL 210  146  288   BUN 6 - 20 mg/dL 22  10  11    Creatinine 0.44 - 1.00 mg/dL 1.19  1.03  0.99   Sodium 135 - 145 mmol/L 139  139  141   Potassium 3.5 - 5.1 mmol/L 4.1  3.8  4.1   Chloride 98 - 111 mmol/L 114  112  113   CO2 22 - 32 mmol/L 19  17  22    Calcium 8.9 - 10.3 mg/dL 9.2  9.4  8.9   Total Protein 6.5 - 8.1 g/dL 6.6  7.0  6.6   Total Bilirubin 0.3 - 1.2 mg/dL 0.5  0.6  0.4   Alkaline Phos 38 - 126 U/L 74  72  82   AST 15 - 41 U/L 11  13  7    ALT 0 - 44 U/L 11  8  6        Latest Ref Rng & Units 02/25/2022    1:44 AM 12/22/2021   10:52 AM 07/26/2021    3:49 AM  CBC  WBC 4.0 - 10.5 K/uL 8.9  8.2  10.2   Hemoglobin 12.0 - 15.0 g/dL 10.0  9.8  10.6   Hematocrit 36.0 - 46.0 % 31.1  30.4  34.4   Platelets 150 - 400 K/uL 238  247  291    Lipid Panel     Component Value Date/Time   CHOL 93 02/21/2022 1104   TRIG 55.0 02/21/2022 1104   HDL 37.00 (L) 02/21/2022 1104   CHOLHDL 3 02/21/2022 1104   VLDL 11.0 02/21/2022 1104   LDLCALC 45 02/21/2022 1104     HEMOGLOBIN A1C Lab Results  Component Value Date   HGBA1C 8.1 (H) 02/21/2022   MPG 323.53 05/22/2021   TSH Recent Labs    05/21/21 0208 02/21/22 1104  TSH 2.871 4.11     BNP (last 3 results)  Recent Labs    05/21/21 0208 07/26/21 0400 02/25/22 0144  BNP 17.9 32.9 14.5       Medications and allergies   Allergies  Allergen Reactions   Metoclopramide Other (See Comments)    Developed restless leg, akathisia type limb  movements.    Codeine Itching, Rash and Other (See Comments)   Hydrocodone Rash   Penicillins Itching    Tolerated Unasyn 02/01/2020   Percocet [Oxycodone-Acetaminophen] Hives and Rash    Tolerates dilaudid Tolerates acetaminophen    Medications after present encounter:  Current Outpatient Medications:    ACCU-CHEK AVIVA PLUS test strip, USE TO TEST BLOOD SUGAR 4 TIMES DAILY, Disp: 100 strip, Rfl: 2   ADVAIR HFA 230-21 MCG/ACT inhaler, Inhale 1-2 puffs into the lungs 2 (two) times daily., Disp: , Rfl:    albuterol (PROVENTIL) (2.5 MG/3ML) 0.083% nebulizer solution, Take 2.5 mg by nebulization every 6 (six) hours as needed for wheezing., Disp: , Rfl:    ALPRAZolam (XANAX) 0.5 MG tablet, Take 0.5 mg 2 (two) times daily as needed by mouth for anxiety or sleep. , Disp: , Rfl:    amLODipine (NORVASC) 10 MG tablet, Take 1 tablet (10 mg total) by mouth daily., Disp: 30 tablet, Rfl: 0   apixaban (ELIQUIS) 5 MG TABS tablet, Take 1 tablet (5 mg total) by mouth 2 (two) times daily., Disp: 60 tablet, Rfl: 0   ARIPiprazole (ABILIFY) 5 MG tablet, Take 5 mg by mouth daily., Disp: , Rfl:    Azathioprine 75 MG TABS, Take 150 mg by mouth 2 (two) times daily., Disp: , Rfl:    B-D UF III MINI PEN NEEDLES 31G X 5 MM MISC, USE AS DIRECTED TWICE A DAY, Disp: 100 each, Rfl: 2   Belimumab (BENLYSTA) 200 MG/ML SOAJ, Inject 200 mg into the skin once a week. Thursdays, Disp: , Rfl:    Blood Glucose Monitoring Suppl (ONETOUCH VERIO) w/Device KIT, Test 4 times daily.  DX E11.9, Disp: 1 kit, Rfl: 1   carvedilol (COREG) 25 MG tablet, Take 1.5 tablets (37.5 mg total) by mouth 2 (two) times daily with a meal., Disp: 270 tablet, Rfl: 3   cromolyn (OPTICROM) 4 % ophthalmic solution, Place 1 drop into both eyes 2 (two) times daily., Disp: , Rfl:    cycloSPORINE (RESTASIS) 0.05 % ophthalmic emulsion, Place 1 drop into both eyes in the morning, at noon, and at bedtime. , Disp: , Rfl:    diclofenac Sodium (VOLTAREN) 1 % GEL, APPLY  2 GRAMS TO AFFECTED AREA 4 TIMES A DAY, Disp: 400 g, Rfl: 1   DULoxetine (CYMBALTA) 60 MG capsule, Take 120 mg by mouth at bedtime., Disp: , Rfl:    EASY TOUCH PEN NEEDLES 32G X 4 MM MISC, USE TO INJECT INSULIN TWICE DAILY, Disp: 100 each, Rfl: 9   empagliflozin (JARDIANCE) 10 MG TABS tablet, Take 1 tablet (10 mg total) by mouth daily with breakfast., Disp: 30 tablet, Rfl: 3   ENTRESTO 97-103 MG, TAKE 1 TABLET BY MOUTH 2 (TWO) TIMES DAILY. (Patient taking differently: Take 1 tablet by mouth 2 (two) times daily.), Disp: 180 tablet, Rfl: 3   famotidine (PEPCID) 20 MG tablet, Take 20 mg by mouth 2 (two) times daily., Disp: , Rfl:    fluticasone (FLONASE) 50 MCG/ACT nasal spray, Place 2 sprays into both nostrils continuous as needed for allergies or rhinitis., Disp: , Rfl:    glucose blood (ACCU-CHEK GUIDE) test strip, 1 each by Other route 2 (two) times daily. And lancets  2/day, Disp: 200 each, Rfl: 3   hydroxychloroquine (PLAQUENIL) 200 MG tablet, Take 200 mg by mouth 2 (two) times daily., Disp: , Rfl:    insulin regular human CONCENTRATED (HUMULIN R U-500 KWIKPEN) 500 UNIT/ML KwikPen, 100 Units, 30 minutes before each meal, Disp: 6 mL, Rfl: 0   Insulin Syringe-Needle U-100 (EASY TOUCH INSULIN SYRINGE) 31G X 5/16" 1 ML MISC, USE AS DIRECTED THREE TIMES A DAY, Disp: 100 each, Rfl: 10   isosorbide-hydrALAZINE (BIDIL) 20-37.5 MG tablet, TAKE TWO (2) TABLETS BY MOUTH THREE TIMES DAILY, Disp: 270 tablet, Rfl: 3   Lancets (ONETOUCH ULTRASOFT) lancets, Test 4 times daily DX E11.9, Disp: 100 each, Rfl: 12   levothyroxine (SYNTHROID) 50 MCG tablet, Take 1 tablet (50 mcg total) by mouth daily at 6 (six) AM., Disp:  , Rfl:    liraglutide (VICTOZA) 18 MG/3ML SOPN, Inject 1.8 mg into the skin every morning., Disp: 9 mL, Rfl: 2   megestrol (MEGACE) 40 MG tablet, Take 40 mg by mouth daily., Disp: , Rfl: 3   metFORMIN (GLUCOPHAGE-XR) 500 MG 24 hr tablet, Take 2 tablets (1,000 mg total) by mouth 2 (two) times daily.,  Disp: 180 tablet, Rfl: 3   Multiple Vitamin (MULTIVITAMIN WITH MINERALS) TABS tablet, Take 1 tablet by mouth daily., Disp:  , Rfl:    oxybutynin (DITROPAN XL) 15 MG 24 hr tablet, Take 15 mg by mouth daily. , Disp: , Rfl:    pregabalin (LYRICA) 75 MG capsule, Take 75 mg 3 (three) times daily by mouth. , Disp: , Rfl:    QUEtiapine (SEROQUEL) 300 MG tablet, Take 300 mg by mouth at bedtime., Disp: , Rfl:    spironolactone (ALDACTONE) 50 MG tablet, TAKE 1 TABLET BY MOUTH EVERY DAY, Disp: 90 tablet, Rfl: 3   sucralfate (CARAFATE) 1 GM/10ML suspension, Take 10 mLs by mouth 2 (two) times daily., Disp: , Rfl:    topiramate (TOPAMAX) 25 MG tablet, Take 75 mg by mouth at bedtime., Disp: , Rfl:    torsemide (DEMADEX) 20 MG tablet, Take 1 tablet (20 mg total) by mouth at bedtime., Disp: 90 tablet, Rfl: 3   TRANSDERM-SCOP 1 MG/3DAYS, 1 patch every 3 (three) days., Disp: , Rfl:    trimethobenzamide (TIGAN) 300 MG capsule, Take 1 capsule (300 mg total) by mouth 3 (three) times daily as needed for nausea/vomiting. Do not take with the Scopolamine unless nausea is not improving, Disp: 10 capsule, Rfl: 0   buPROPion (WELLBUTRIN XL) 300 MG 24 hr tablet, Take 300 mg by mouth daily., Disp: , Rfl:    rosuvastatin (CRESTOR) 20 MG tablet, Take 1 tablet (20 mg total) by mouth daily., Disp: 90 tablet, Rfl: 3    Radiology:    CXR 02/01/2020: New patchy bilateral airspace disease most consistent with pneumonia. Chronic cardiomegaly and vascular congestion.  CT angiogram chest 05/21/2021: 1. Bilateral occlusive acute pulmonary emboli within the proximal segmental pulmonary arteries. Overall clot burden is severe. Evidence of RIGHT ventricular strain (RV/LV Ratio = 1.15) consistent with at least submassive (intermediate risk) PE. The presence of right heart strain has been associated with an increased risk of morbidity and mortality.  2. No pulmonary infarction.  Cardiac Studies:   Coronary angiogram 02/07/2012:  Normal coronary arteries and LVEF. Left dominant circulation.  Carotid duplex 03/06/2014: The vertebral arteries appear patent with antegrade flow. Findings consistent with 1-39 percent stenosis involving the right internal carotid artery and the left internal carotid artery.  Vascular Ultrasound Lower Extremity Venous 03/26/2018: Right: There is  no evidence of deep vein thrombosis in the lower extremity. No cystic structure found in the popliteal fossa. Left: No evidence of common femoral vein obstruction.  PCV ECHOCARDIOGRAM COMPLETE 09/09/2021  Narrative Echocardiogram 09/09/2021: Normal LV systolic function with visual EF 55-60%. Left ventricle cavity is normal in size. Normal left ventricular wall thickness. Normal global wall motion. Normal diastolic filling pattern, normal LAP. Mild tricuspid regurgitation. No evidence of pulmonary hypertension. Compared to study 05/21/2021 LVEF improved from 30-35% to 55-60%, G1 DD is now normal, RV systolic function has improved, otherwise no significant change.  EKG   EKG 03/21/2022: Normal sinus rhythm at rate of 94 bpm, normal axis, poor R wave progression, cannot exclude anterolateral infarct old.  Low-voltage chest leads.  Nonspecific T abnormality.  Compared to 01/21/2020, no significant change.  Assessment:     ICD-10-CM   1. Essential hypertension  I10 EKG 12-Lead      No orders of the defined types were placed in this encounter.  Medications Discontinued During This Encounter  Medication Reason   Semaglutide, 2 MG/DOSE, 8 MG/3ML SOPN    Recommendations:   [name]  is a 51 y.o. female  with Sjogren's syndrome, SLE, nonischemic cardiomyopathy, severe LVEF in 2018, and again in 2021, with intermittent improvement in LVEF by echo, last echocardiogram in February 2023 revealing preserved LVEF,  morbid obesity, obesity hypoventilation syndrome, uncontrolled type 2 DM, hypertension, h/o CVA in 2014, OSA on CPAP and hepatic  adenoma s/p  embolization on 08/12/19.  She also developed bilateral pulmonary emboli within the proximal segment pulmonary arterioles with severe clot burden and evidence of RV strain consistent with submassive PE in September 2022 when she was admitted with pneumonia.  This is annual visit.  Dyspnea on exertion has remained stable.  She has not had any recent hospitalization for acute decompensated heart failure.  She was recently started on Jardiance which she is tolerating for diabetes.  Overall she has lost about 37 pounds in weight over the past 1 year.  She is now not oxygen dependent.  Blood pressure is well controlled.  Overall stable from cardiac standpoint, would like to see him back in a year or sooner if problems.  I encouraged her to continue to lose weight.   I will see her back in 6 months for follow-up.   Adrian Prows, MD, [facility] 03/21/2022, 3:14 PM Office: [phone]

## 2022-03-22 ENCOUNTER — Ambulatory Visit: Payer: Medicaid Other | Attending: Pediatrics

## 2022-03-22 DIAGNOSIS — R2689 Other abnormalities of gait and mobility: Secondary | ICD-10-CM | POA: Diagnosis present

## 2022-03-22 DIAGNOSIS — M25562 Pain in left knee: Secondary | ICD-10-CM | POA: Insufficient documentation

## 2022-03-22 DIAGNOSIS — M23304 Other meniscus derangements, unspecified medial meniscus, left knee: Secondary | ICD-10-CM | POA: Diagnosis present

## 2022-03-28 NOTE — Therapy (Signed)
OUTPATIENT PHYSICAL THERAPY TREATMENT NOTE   Patient Name: [name] MRN: [id number] DOB:[birth year], 51 y.o., female Today's Date: 03/29/2022  PCP: Bartholome Bill, MD REFERRING PROVIDER: Girard Cooter, MD  END OF SESSION:   PT End of Session - 03/29/22 1039     Visit Number 2    Number of Visits 4    Date for PT Re-Evaluation 05/22/22    Authorization Type [employer]    Authorization Time Period 8 visits approved 03/23/22-04/21/22    Authorization - Visit Number 1    Authorization - Number of Visits 8    PT Start Time 2774    PT Stop Time 1123    PT Time Calculation (min) 38 min    Activity Tolerance Patient tolerated treatment well    Behavior During Therapy [facility] for tasks assessed/performed             Past Medical History:  Diagnosis Date   Acute renal failure ([place])     Intractable nausea vomiting secondary to diabetic gastroparesis causing dehydration and acute renal failure /notes 04/01/2013   Anginal pain ([place])    Anxiety    Bell's palsy 08/09/2010   Bicornuate uterus    CAP (community acquired pneumonia) 10/2011   Archie Endo 11/08/2011   Chest pain 01/13/2014   CHF (congestive heart failure) ([place])    Diabetic gastroparesis associated with type 2 diabetes mellitus ([place])    this is presumed diagnoses, not confirmed by any studies.    Eczema    Family history of breast cancer    Family history of malignant neoplasm of breast    Family history of malignant neoplasm of ovary    Family history of ovarian cancer    Family history of prostate cancer    GERD (gastroesophageal reflux disease)    High cholesterol    Hypertension    Liver lesion 08/13/2019   Liver mass 2014   biopsied 03/2013 at [facility], not malignant.  is to undergo radiologic ablation of the mass in sept/October 2014.    Lupus ([place])    Migraines    "maybe a couple times/yr" (04/01/2013)   Obesity    Obstructive sleep apnea on CPAP 2011   Oxygen at nights   SJOGREN'S  SYNDROME 08/09/2010   SLE (systemic lupus erythematosus) ([place])    Archie Endo 12/01/2010   Past Surgical History:  Procedure Laterality Date   BREAST CYST EXCISION Left 08/2005   epidermoid   CARDIAC CATHETERIZATION  02/07/2012   ECTOPIC PREGNANCY SURGERY  1999   ESOPHAGOGASTRODUODENOSCOPY N/A 02/26/2013   Procedure: ESOPHAGOGASTRODUODENOSCOPY (EGD);  Surgeon: Irene Shipper, MD;  Location: [facility] ENDOSCOPY;  Service: Endoscopy;  Laterality: N/A;   ESOPHAGOGASTRODUODENOSCOPY N/A 03/02/2015   Procedure: ESOPHAGOGASTRODUODENOSCOPY (EGD);  Surgeon: Milus Banister, MD;  Location: [place];  Service: Endoscopy;  Laterality: N/A;   ESOPHAGOGASTRODUODENOSCOPY N/A 02/23/2021   Procedure: ESOPHAGOGASTRODUODENOSCOPY (EGD);  Surgeon: Wilford Corner, MD;  Location: [place];  Service: Endoscopy;  Laterality: N/A;  possible dilation   HERNIA REPAIR     LEFT AND RIGHT HEART CATHETERIZATION WITH CORONARY ANGIOGRAM N/A 02/07/2012   Procedure: LEFT AND RIGHT HEART CATHETERIZATION WITH CORONARY ANGIOGRAM;  Surgeon: Laverda Page, MD;  Location[facility] CATH LAB;  Service: Cardiovascular;  Laterality: N/A;   LIVER BIOPSY  03/2013   liver mass/medical hx noted above   MUSCLE BIOPSY     for lupus/notes 08/02/8784   UMBILICAL HERNIA REPAIR  1980's   Patient Active Problem List  Diagnosis Date Noted   Vitamin B12 deficiency 01/04/2022   History of pulmonary embolism 12/22/2021   Normocytic anemia 12/22/2021   Foot pain, bilateral 11/22/2021   Genetic testing 08/17/2021   Family history of breast cancer 07/19/2021   Family history of prostate cancer 07/19/2021   Esophageal dysmotility 06/03/2021   Pulmonary embolism ([place]) 05/21/2021   Congestive heart failure ([place]) 09/28/2020   Physical debility 08/29/2019   Obesity hypoventilation syndrome ([place]) 08/23/2019   Skin breakdown 08/23/2019   Essential hypertension 08/23/2019   Liver hemorrhage 08/23/2019   CVA (cerebral vascular accident) ([place]) 08/19/2019    Major depressive disorder with single episode, in partial remission ([place]) 08/19/2019   Morbid obesity with BMI of 50.0-59.9, adult ([place]) 08/19/2019   SLE (systemic lupus erythematosus related syndrome) ([place]) 08/19/2019   Hepatic adenoma    Sciatica 04/20/2018   Type 2 diabetes mellitus with hyperlipidemia ([place]) 09/19/2015   Radiculopathy of cervical spine    Family history of malignant neoplasm of breast    Family history of malignant neoplasm of ovary    Intractable nausea and vomiting 11/11/2013   Septate uterus 09/08/2013   Diabetic gastroparesis ([place]) 04/28/2013   Constipation due to pain medication 04/21/2013   Odynophagia 04/04/2013   Liver masses 02/28/2013   Reflux esophagitis 02/26/2013   Morbid obesity ([place]) 10/10/2012   Chronic diastolic CHF (congestive heart failure) ([place]) 10/09/2012   Clinical polymyositis syndrome ([place]) 09/06/2012   Mononeuritis of lower limb 01/12/2012   Hypertension complicating diabetes ([place]) 80/99/8338   Chronic systolic CHF (congestive heart failure) ([place]) 11/08/2011   HLD (hyperlipidemia) 07/19/2011   Bell's palsy 08/09/2010   SLE 08/09/2010   SJOGREN'S SYNDROME 08/09/2010    REFERRING DIAG: acute pain left knee   THERAPY DIAG:  Acute pain of left knee  Meniscus, medial, derangement, left  Other abnormalities of gait and mobility  Rationale for Evaluation and Treatment Rehabilitation  PERTINENT HISTORY: Lupus   PRECAUTIONS: None  ONSET DATE: April 2023  SUBJECTIVE: Patient reports that her R knee is hurting worse than the L one today, the L has been better since receiving an injection.  PAIN:  Are you having pain? Yes: NPRS scale: 9/10 (R knee today) Pain location: L medial knee Pain description: Throbbing Aggravating factors: change of direction and unexpected Relieving factors: rest   OBJECTIVE: (objective measures completed at initial evaluation unless otherwise dated)   DIAGNOSTIC FINDINGS: 02/11/2022 5:31 PM EDT     1.  Complex tears of the anterior horns and bodies of the medial and lateral menisci. 2.  Small joint effusion with synovitis. 3.  High-grade chondrosis along the weightbearing surfaces of the medial and lateral compartments. 4.  Mild edema within the posterior compartment musculature, nonspecific.   PATIENT SURVEYS:  LEFS 24/80   COGNITION:           Overall cognitive status: Within functional limits for tasks assessed                          SENSATION: WFL   EDEMA:  Trace effusion suspected   MUSCLE LENGTH: Hamstrings: Right WFL B   POSTURE:  unremarkable    PALPATION: Tender to medial and lateral joint lines    LOWER EXTREMITY ROM:   A/PROM Right eval Left eval  Hip flexion 90 90  Hip extension      Hip abduction      Hip adduction      Hip internal rotation  Hip external rotation      Knee flexion 110d 105d  Knee extension +5d -5d/+5d  Ankle dorsiflexion      Ankle plantarflexion      Ankle inversion      Ankle eversion       (Blank rows = not tested)   LOWER EXTREMITY MMT:   MMT Right eval Left eval  Hip flexion 4 4  Hip extension 4 4  Hip abduction 4 4  Hip adduction      Hip internal rotation      Hip external rotation      Knee flexion 4 4  Knee extension 4 4-  Ankle dorsiflexion      Ankle plantarflexion 4 4  Ankle inversion      Ankle eversion       (Blank rows = not tested)   LOWER EXTREMITY SPECIAL TESTS:  Knee special tests: stable to varus/valgus stressing   FUNCTIONAL TESTS:  5 times sit to stand: 30s arms crossed   GAIT: Distance walked: 60fx2 Assistive device utilized: Single point cane Level of assistance: Modified independence Comments: antalgic gait pattern       TODAY'S TREATMENT: [facility] Adult PT Treatment:                                                DATE: 03/29/2022 Therapeutic Exercise: Nustep level 4 x 6 mins Seated marching 2x10 BIL LAQ 2x10 BIL Seated hamstring curl GTB 2x10 BIL Seated heel/toe  raises 2x10 each Supine quad set 5" hold 2x10 BIL Supine SLR x10 BIL (small range) Clamshells GTB 2x10 Supine marching GTB 2x10 BIL Supine hip adduction ball squeeze 5" hold 2x10 STS arms crossed x10  03/22/2022: Eval     PATIENT EDUCATION:  Education details: Discussed eval findings, rehab rationale and POC and patient is in agreement  Person educated: Patient Education method: Explanation and Handouts Education comprehension: verbalized understanding and needs further education     HOME EXERCISE PROGRAM: Access Code: [email or web address] URL: [email or web address] Date: 03/22/2022 Prepared by: JSharlynn Oliphant  Exercises - Supine Quad Set  - 2 x daily - 5 x weekly - 2 sets - 10 reps - 3s hold - Supine Straight Leg Raises  - 2 x daily - 5 x weekly - 2 sets - 10 reps - Supine Knee Extension Strengthening  - 2 x daily - 5 x weekly - 2 sets - 10 reps - Seated Heel Slide  - 2 x daily - 5 x weekly - 2 sets - 10 reps   ASSESSMENT:   CLINICAL IMPRESSION: Patient presents to PT with high pain in her R knee today and states the L knee has been feeling better since the injection she received. She reports HEP compliance. Session today focused on BIL LE strengthening, particular focus on quads. SLR on R was a very small range and STS exacerbated R knee pain. Patient was able to tolerate all prescribed exercises with no adverse effects though she is somewhat limited throughout due to fatigue and body habitus. Patient continues to benefit from skilled PT services and should be progressed as able to improve functional independence.    OBJECTIVE IMPAIRMENTS decreased activity tolerance, decreased knowledge of condition, difficulty walking, decreased ROM, decreased strength, and pain.    ACTIVITY LIMITATIONS lifting, squatting, and stairs   PERSONAL FACTORS Fitness, Past/current experiences, Time since onset  of injury/illness/exacerbation, and 1 comorbidity: Lupus  are also affecting patient's  functional outcome.    REHAB POTENTIAL: Fair based on meniscal pathology   CLINICAL DECISION MAKING: Stable/uncomplicated   EVALUATION COMPLEXITY: Low     GOALS: Goals reviewed with patient? Yes   SHORT TERM GOALS=LONG TERM GOALS: Target date: 04/19/2022  Patient to demonstrate independence in HEP  Baseline:[id number] Goal status: INITIAL   2.  Decrease average pain to 4/10 Baseline: 10/10 worst pain, 6/10 average pain level Goal status: INITIAL   3.  Increase L knee extensor strength to 4/5 Baseline: 4-/5 Goal status: INITIAL   4.  Increase AROM L knee to 0d Baseline: L knee active extension -5d Goal status: INITIAL   5.  Increase LEFS score to 34/80 Baseline: 24/80 Goal status: INITIAL     PLAN: PT FREQUENCY: 1x/week   PT DURATION: 4 weeks   PLANNED INTERVENTIONS: Therapeutic exercises, Therapeutic activity, Neuromuscular re-education, Balance training, Gait training, Patient/Family education, Self Care, Joint mobilization, Stair training, DME instructions, Taping, Manual therapy, and Re-evaluation   PLAN FOR NEXT SESSION: HEP review and update, CKC tasks, ROM, LE strengthening    Margarette Canada, PTA 03/29/2022, 11:24 AM

## 2022-03-29 ENCOUNTER — Ambulatory Visit: Payer: Medicaid Other

## 2022-03-29 DIAGNOSIS — M23304 Other meniscus derangements, unspecified medial meniscus, left knee: Secondary | ICD-10-CM

## 2022-03-29 DIAGNOSIS — M25562 Pain in left knee: Secondary | ICD-10-CM

## 2022-03-29 DIAGNOSIS — R2689 Other abnormalities of gait and mobility: Secondary | ICD-10-CM

## 2022-03-30 ENCOUNTER — Other Ambulatory Visit: Payer: Self-pay

## 2022-03-30 DIAGNOSIS — E1165 Type 2 diabetes mellitus with hyperglycemia: Secondary | ICD-10-CM

## 2022-03-30 MED ORDER — ONETOUCH ULTRA 2 W/DEVICE KIT: PACK | 0 refills | Status: DC

## 2022-03-30 MED ORDER — ONETOUCH ULTRASOFT LANCETS MISC: 12 refills | Status: DC

## 2022-03-30 MED ORDER — ONETOUCH ULTRA VI STRP: ORAL_STRIP | 12 refills | Status: DC

## 2022-04-05 ENCOUNTER — Other Ambulatory Visit: Payer: Self-pay

## 2022-04-05 ENCOUNTER — Ambulatory Visit: Payer: Medicaid Other | Admitting: Endocrinology

## 2022-04-05 DIAGNOSIS — Z809 Family history of malignant neoplasm, unspecified: Secondary | ICD-10-CM

## 2022-04-05 DIAGNOSIS — E538 Deficiency of other specified B group vitamins: Secondary | ICD-10-CM

## 2022-04-05 DIAGNOSIS — D649 Anemia, unspecified: Secondary | ICD-10-CM

## 2022-04-05 NOTE — Therapy (Signed)
OUTPATIENT PHYSICAL THERAPY TREATMENT NOTE   Patient Name: [name] MRN: [id number] DOB:August 24, 1970, 51 y.o., female Today's Date: 04/06/2022  PCP: Bartholome Bill, MD REFERRING PROVIDER: Girard Cooter, MD  END OF SESSION:   PT End of Session - 04/06/22 0910     Visit Number 3    Number of Visits 4    Date for PT Re-Evaluation 05/22/22    Authorization Type [employer]    Authorization Time Period 8 visits approved 03/23/22-04/21/22    Authorization - Visit Number 2    Authorization - Number of Visits 8    PT Start Time 0910    PT Stop Time 0951    PT Time Calculation (min) 41 min    Activity Tolerance Patient tolerated treatment well    Behavior During Therapy [facility] for tasks assessed/performed              Past Medical History:  Diagnosis Date   Acute renal failure ([place])     Intractable nausea vomiting secondary to diabetic gastroparesis causing dehydration and acute renal failure /notes 04/01/2013   Anginal pain ([place])    Anxiety    Bell's palsy 08/09/2010   Bicornuate uterus    CAP (community acquired pneumonia) 10/2011   Archie Endo 11/08/2011   Chest pain 01/13/2014   CHF (congestive heart failure) ([place])    Diabetic gastroparesis associated with type 2 diabetes mellitus ([place])    this is presumed diagnoses, not confirmed by any studies.    Eczema    Family history of breast cancer    Family history of malignant neoplasm of breast    Family history of malignant neoplasm of ovary    Family history of ovarian cancer    Family history of prostate cancer    GERD (gastroesophageal reflux disease)    High cholesterol    Hypertension    Liver lesion 08/13/2019   Liver mass 2014   biopsied 03/2013 at [facility], not malignant.  is to undergo radiologic ablation of the mass in sept/October 2014.    Lupus ([place])    Migraines    "maybe a couple times/yr" (04/01/2013)   Obesity    Obstructive sleep apnea on CPAP 2011   Oxygen at nights   SJOGREN'S  SYNDROME 08/09/2010   SLE (systemic lupus erythematosus) ([place])    Archie Endo 12/01/2010   Past Surgical History:  Procedure Laterality Date   BREAST CYST EXCISION Left 08/2005   epidermoid   CARDIAC CATHETERIZATION  02/07/2012   ECTOPIC PREGNANCY SURGERY  1999   ESOPHAGOGASTRODUODENOSCOPY N/A 02/26/2013   Procedure: ESOPHAGOGASTRODUODENOSCOPY (EGD);  Surgeon: Irene Shipper, MD;  Location: [facility] ENDOSCOPY;  Service: Endoscopy;  Laterality: N/A;   ESOPHAGOGASTRODUODENOSCOPY N/A 03/02/2015   Procedure: ESOPHAGOGASTRODUODENOSCOPY (EGD);  Surgeon: Milus Banister, MD;  Location: [place];  Service: Endoscopy;  Laterality: N/A;   ESOPHAGOGASTRODUODENOSCOPY N/A 02/23/2021   Procedure: ESOPHAGOGASTRODUODENOSCOPY (EGD);  Surgeon: Wilford Corner, MD;  Location: [place];  Service: Endoscopy;  Laterality: N/A;  possible dilation   HERNIA REPAIR     LEFT AND RIGHT HEART CATHETERIZATION WITH CORONARY ANGIOGRAM N/A 02/07/2012   Procedure: LEFT AND RIGHT HEART CATHETERIZATION WITH CORONARY ANGIOGRAM;  Surgeon: Laverda Page, MD;  Location: [facility] CATH LAB;  Service: Cardiovascular;  Laterality: N/A;   LIVER BIOPSY  03/2013   liver mass/medical hx noted above   MUSCLE BIOPSY     for lupus/notes 09/06/3333   UMBILICAL HERNIA REPAIR  1980's   Patient Active Problem List  Diagnosis Date Noted   Vitamin B12 deficiency 01/04/2022   History of pulmonary embolism 12/22/2021   Normocytic anemia 12/22/2021   Foot pain, bilateral 11/22/2021   Genetic testing 08/17/2021   Family history of breast cancer 07/19/2021   Family history of prostate cancer 07/19/2021   Esophageal dysmotility 06/03/2021   Pulmonary embolism ([place]) 05/21/2021   Congestive heart failure ([place]) 09/28/2020   Physical debility 08/29/2019   Obesity hypoventilation syndrome ([place]) 08/23/2019   Skin breakdown 08/23/2019   Essential hypertension 08/23/2019   Liver hemorrhage 08/23/2019   CVA (cerebral vascular accident) ([place]) 08/19/2019    Major depressive disorder with single episode, in partial remission ([place]) 08/19/2019   Morbid obesity with BMI of 50.0-59.9, adult ([place]) 08/19/2019   SLE (systemic lupus erythematosus related syndrome) ([place]) 08/19/2019   Hepatic adenoma    Sciatica 04/20/2018   Type 2 diabetes mellitus with hyperlipidemia ([place]) 09/19/2015   Radiculopathy of cervical spine    Family history of malignant neoplasm of breast    Family history of malignant neoplasm of ovary    Intractable nausea and vomiting 11/11/2013   Septate uterus 09/08/2013   Diabetic gastroparesis ([place]) 04/28/2013   Constipation due to pain medication 04/21/2013   Odynophagia 04/04/2013   Liver masses 02/28/2013   Reflux esophagitis 02/26/2013   Morbid obesity ([place]) 10/10/2012   Chronic diastolic CHF (congestive heart failure) ([place]) 10/09/2012   Clinical polymyositis syndrome ([place]) 09/06/2012   Mononeuritis of lower limb 01/12/2012   Hypertension complicating diabetes ([place]) 96/78/9381   Chronic systolic CHF (congestive heart failure) ([place]) 11/08/2011   HLD (hyperlipidemia) 07/19/2011   Bell's palsy 08/09/2010   SLE 08/09/2010   SJOGREN'S SYNDROME 08/09/2010    REFERRING DIAG: acute pain left knee   THERAPY DIAG:  Acute pain of left knee  Meniscus, medial, derangement, left  Other abnormalities of gait and mobility  Rationale for Evaluation and Treatment Rehabilitation  PERTINENT HISTORY: Lupus   PRECAUTIONS: None  ONSET DATE: April 2023  SUBJECTIVE: Patient reports more stiffness than pain in her knees today. Her R knee hurts worse than the L, which is still doing good since the injection.  PAIN:  Are you having pain? Yes: NPRS scale: 7/10 (R knee today) Pain location: L medial knee Pain description: Throbbing Aggravating factors: change of direction and unexpected Relieving factors: rest   OBJECTIVE: (objective measures completed at initial evaluation unless otherwise dated)   DIAGNOSTIC FINDINGS:  02/11/2022 5:31 PM EDT    1.  Complex tears of the anterior horns and bodies of the medial and lateral menisci. 2.  Small joint effusion with synovitis. 3.  High-grade chondrosis along the weightbearing surfaces of the medial and lateral compartments. 4.  Mild edema within the posterior compartment musculature, nonspecific.   PATIENT SURVEYS:  LEFS 24/80   COGNITION:           Overall cognitive status: Within functional limits for tasks assessed                          SENSATION: WFL   EDEMA:  Trace effusion suspected   MUSCLE LENGTH: Hamstrings: Right WFL B   POSTURE:  unremarkable    PALPATION: Tender to medial and lateral joint lines    LOWER EXTREMITY ROM:   A/PROM Right eval Left eval  Hip flexion 90 90  Hip extension      Hip abduction      Hip adduction      Hip internal rotation  Hip external rotation      Knee flexion 110d 105d  Knee extension +5d -5d/+5d  Ankle dorsiflexion      Ankle plantarflexion      Ankle inversion      Ankle eversion       (Blank rows = not tested)   LOWER EXTREMITY MMT:   MMT Right eval Left eval  Hip flexion 4 4  Hip extension 4 4  Hip abduction 4 4  Hip adduction      Hip internal rotation      Hip external rotation      Knee flexion 4 4  Knee extension 4 4-  Ankle dorsiflexion      Ankle plantarflexion 4 4  Ankle inversion      Ankle eversion       (Blank rows = not tested)   LOWER EXTREMITY SPECIAL TESTS:  Knee special tests: stable to varus/valgus stressing   FUNCTIONAL TESTS:  5 times sit to stand: 30s arms crossed   GAIT: Distance walked: 81fx2 Assistive device utilized: Single point cane Level of assistance: Modified independence Comments: antalgic gait pattern       TODAY'S TREATMENT: [facility] Adult PT Treatment:                                                DATE: 04/05/2022 Therapeutic Exercise: Nustep level 5 x 6 mins Seated marching 2x10 BIL LAQ 2# 2x10 BIL Seated hamstring curl GTB  2x10 BIL Seated heel/toe raises 2x15 each Supine quad set 5" hold 2x10 BIL Supine SLR 2x10 BIL (small range) Bridges 2x10 small range Clamshells GTB 2x15 Supine marching GTB 2x10 BIL Supine hip adduction ball squeeze 5" hold 2x10 STS arms crossed x10  [facility] Adult PT Treatment:                                                DATE: 03/29/2022 Therapeutic Exercise: Nustep level 4 x 6 mins Seated marching 2x10 BIL LAQ 2x10 BIL Seated hamstring curl GTB 2x10 BIL Seated heel/toe raises 2x10 each Supine quad set 5" hold 2x10 BIL Supine SLR x10 BIL (small range) Clamshells GTB 2x10 Supine marching GTB 2x10 BIL Supine hip adduction ball squeeze 5" hold 2x10 STS arms crossed x10  03/22/2022: Eval     PATIENT EDUCATION:  Education details: Discussed eval findings, rehab rationale and POC and patient is in agreement  Person educated: Patient Education method: Explanation and Handouts Education comprehension: verbalized understanding and needs further education     HOME EXERCISE PROGRAM: Access Code: [email or web address] URL: [email or web address] Date: 03/22/2022 Prepared by: JSharlynn Oliphant  Exercises - Supine Quad Set  - 2 x daily - 5 x weekly - 2 sets - 10 reps - 3s hold - Supine Straight Leg Raises  - 2 x daily - 5 x weekly - 2 sets - 10 reps - Supine Knee Extension Strengthening  - 2 x daily - 5 x weekly - 2 sets - 10 reps - Seated Heel Slide  - 2 x daily - 5 x weekly - 2 sets - 10 reps   ASSESSMENT:   CLINICAL IMPRESSION: Patient presents to PT with continued pain in her R knee and states both are feeling stiff and  tight today but the L knee is still doing well since the injection. She reports HEP compliance. Session today focused on BIL LE strengthening and improving activity tolerance. Patient was able to complete more repetitions of exercises today with no adverse effect. She has the most difficulty with SLR on the Rt due to pain in the knee and weakness of quads and hip  flexors. Patient continues to benefit from skilled PT services and should be progressed as able to improve functional independence.    OBJECTIVE IMPAIRMENTS decreased activity tolerance, decreased knowledge of condition, difficulty walking, decreased ROM, decreased strength, and pain.    ACTIVITY LIMITATIONS lifting, squatting, and stairs   PERSONAL FACTORS Fitness, Past/current experiences, Time since onset of injury/illness/exacerbation, and 1 comorbidity: Lupus  are also affecting patient's functional outcome.    REHAB POTENTIAL: Fair based on meniscal pathology   CLINICAL DECISION MAKING: Stable/uncomplicated   EVALUATION COMPLEXITY: Low     GOALS: Goals reviewed with patient? Yes   SHORT TERM GOALS=LONG TERM GOALS: Target date: 04/19/2022  Patient to demonstrate independence in HEP  Baseline:[id number] Goal status: MET Patient reports adherence 04/06/22   2.  Decrease average pain to 4/10 Baseline: 10/10 worst pain, 6/10 average pain level Goal status: INITIAL   3.  Increase L knee extensor strength to 4/5 Baseline: 4-/5 Goal status: INITIAL   4.  Increase AROM L knee to 0d Baseline: L knee active extension -5d Goal status: INITIAL   5.  Increase LEFS score to 34/80 Baseline: 24/80 Goal status: INITIAL     PLAN: PT FREQUENCY: 1x/week   PT DURATION: 4 weeks   PLANNED INTERVENTIONS: Therapeutic exercises, Therapeutic activity, Neuromuscular re-education, Balance training, Gait training, Patient/Family education, Self Care, Joint mobilization, Stair training, DME instructions, Taping, Manual therapy, and Re-evaluation   PLAN FOR NEXT SESSION: HEP review and update, CKC tasks, ROM, LE strengthening    Margarette Canada, PTA 04/06/2022, 9:52 AM

## 2022-04-06 ENCOUNTER — Ambulatory Visit: Payer: Medicaid Other | Attending: Pediatrics

## 2022-04-06 ENCOUNTER — Inpatient Hospital Stay: Payer: Medicaid Other | Attending: Physician Assistant

## 2022-04-06 ENCOUNTER — Inpatient Hospital Stay: Payer: Medicaid Other | Admitting: Hematology and Oncology

## 2022-04-06 ENCOUNTER — Other Ambulatory Visit: Payer: Self-pay | Admitting: Hematology and Oncology

## 2022-04-06 ENCOUNTER — Inpatient Hospital Stay: Payer: Medicaid Other

## 2022-04-06 DIAGNOSIS — R2689 Other abnormalities of gait and mobility: Secondary | ICD-10-CM | POA: Insufficient documentation

## 2022-04-06 DIAGNOSIS — M23304 Other meniscus derangements, unspecified medial meniscus, left knee: Secondary | ICD-10-CM | POA: Diagnosis present

## 2022-04-06 DIAGNOSIS — M25562 Pain in left knee: Secondary | ICD-10-CM | POA: Insufficient documentation

## 2022-04-06 DIAGNOSIS — D649 Anemia, unspecified: Secondary | ICD-10-CM

## 2022-04-12 ENCOUNTER — Ambulatory Visit: Payer: Medicaid Other

## 2022-04-14 NOTE — Therapy (Unsigned)
OUTPATIENT PHYSICAL THERAPY TREATMENT NOTE/DC SUMMARY   Patient Name: [name] MRN: [id number] DOB:March 01, 1971, 51 y.o., female Today's Date: 04/15/2022  PCP: Bartholome Bill, MD REFERRING PROVIDER: Girard Cooter, MD PHYSICAL THERAPY DISCHARGE SUMMARY  Visits from Start of Care: 4  Current functional level related to goals / functional outcomes: Goals met   Remaining deficits: none   Education / Equipment: HEP   Patient agrees to discharge. Patient goals were met. Patient is being discharged due to being pleased with the current functional level.  END OF SESSION:   PT End of Session - 04/15/22 1211     Visit Number 4    Number of Visits 4    Date for PT Re-Evaluation 05/22/22    Authorization Type [employer]    Authorization Time Period 8 visits approved 03/23/22-04/21/22    Authorization - Visit Number 2    Authorization - Number of Visits 8    PT Start Time 1215    PT Stop Time 1255    PT Time Calculation (min) 40 min    Activity Tolerance Patient tolerated treatment well    Behavior During Therapy [facility] for tasks assessed/performed               Past Medical History:  Diagnosis Date   Acute renal failure ([place])     Intractable nausea vomiting secondary to diabetic gastroparesis causing dehydration and acute renal failure /notes 04/01/2013   Anginal pain ([place])    Anxiety    Bell's palsy 08/09/2010   Bicornuate uterus    CAP (community acquired pneumonia) 10/2011   Archie Endo 11/08/2011   Chest pain 01/13/2014   CHF (congestive heart failure) ([place])    Diabetic gastroparesis associated with type 2 diabetes mellitus ([place])    this is presumed diagnoses, not confirmed by any studies.    Eczema    Family history of breast cancer    Family history of malignant neoplasm of breast    Family history of malignant neoplasm of ovary    Family history of ovarian cancer    Family history of prostate cancer    GERD (gastroesophageal reflux disease)     High cholesterol    Hypertension    Liver lesion 08/13/2019   Liver mass 2014   biopsied 03/2013 at [facility] not malignant.  is to undergo radiologic ablation of the mass in sept/October 2014.    Lupus ([place])    Migraines    "maybe a couple times/yr" (04/01/2013)   Obesity    Obstructive sleep apnea on CPAP 2011   Oxygen at nights   SJOGREN'S SYNDROME 08/09/2010   SLE (systemic lupus erythematosus) ([place])    Archie Endo 12/01/2010   Past Surgical History:  Procedure Laterality Date   BREAST CYST EXCISION Left 08/2005   epidermoid   CARDIAC CATHETERIZATION  02/07/2012   ECTOPIC PREGNANCY SURGERY  1999   ESOPHAGOGASTRODUODENOSCOPY N/A 02/26/2013   Procedure: ESOPHAGOGASTRODUODENOSCOPY (EGD);  Surgeon: Irene Shipper, MD;  Location: [facility] ENDOSCOPY;  Service: Endoscopy;  Laterality: N/A;   ESOPHAGOGASTRODUODENOSCOPY N/A 03/02/2015   Procedure: ESOPHAGOGASTRODUODENOSCOPY (EGD);  Surgeon: Milus Banister, MD;  Location: [place];  Service: Endoscopy;  Laterality: N/A;   ESOPHAGOGASTRODUODENOSCOPY N/A 02/23/2021   Procedure: ESOPHAGOGASTRODUODENOSCOPY (EGD);  Surgeon: Wilford Corner, MD;  Location: [place];  Service: Endoscopy;  Laterality: N/A;  possible dilation   HERNIA REPAIR     LEFT AND RIGHT HEART CATHETERIZATION WITH CORONARY ANGIOGRAM N/A 02/07/2012   Procedure: LEFT AND RIGHT HEART  CATHETERIZATION WITH CORONARY ANGIOGRAM;  Surgeon: Laverda Page, MD;  Location: [facility] CATH LAB;  Service: Cardiovascular;  Laterality: N/A;   LIVER BIOPSY  03/2013   liver mass/medical hx noted above   MUSCLE BIOPSY     for lupus/notes 10/06/3426   UMBILICAL HERNIA REPAIR  1980's   Patient Active Problem List   Diagnosis Date Noted   Vitamin B12 deficiency 01/04/2022   History of pulmonary embolism 12/22/2021   Normocytic anemia 12/22/2021   Foot pain, bilateral 11/22/2021   Genetic testing 08/17/2021   Family history of breast cancer 07/19/2021   Family history of prostate cancer  07/19/2021   Esophageal dysmotility 06/03/2021   Pulmonary embolism ([place]) 05/21/2021   Congestive heart failure ([place]) 09/28/2020   Physical debility 08/29/2019   Obesity hypoventilation syndrome ([place]) 08/23/2019   Skin breakdown 08/23/2019   Essential hypertension 08/23/2019   Liver hemorrhage 08/23/2019   CVA (cerebral vascular accident) ([place]) 08/19/2019   Major depressive disorder with single episode, in partial remission ([place]) 08/19/2019   Morbid obesity with BMI of 50.0-59.9, adult ([place]) 08/19/2019   SLE (systemic lupus erythematosus related syndrome) ([place]) 08/19/2019   Hepatic adenoma    Sciatica 04/20/2018   Type 2 diabetes mellitus with hyperlipidemia ([place]) 09/19/2015   Radiculopathy of cervical spine    Family history of malignant neoplasm of breast    Family history of malignant neoplasm of ovary    Intractable nausea and vomiting 11/11/2013   Septate uterus 09/08/2013   Diabetic gastroparesis ([place]) 04/28/2013   Constipation due to pain medication 04/21/2013   Odynophagia 04/04/2013   Liver masses 02/28/2013   Reflux esophagitis 02/26/2013   Morbid obesity ([place]) 10/10/2012   Chronic diastolic CHF (congestive heart failure) ([place]) 10/09/2012   Clinical polymyositis syndrome ([place]) 09/06/2012   Mononeuritis of lower limb 01/12/2012   Hypertension complicating diabetes ([place]) 76/81/1572   Chronic systolic CHF (congestive heart failure) ([place]) 11/08/2011   HLD (hyperlipidemia) 07/19/2011   Bell's palsy 08/09/2010   SLE 08/09/2010   SJOGREN'S SYNDROME 08/09/2010    REFERRING DIAG: acute pain left knee   THERAPY DIAG: acute pain left knee    Rationale for Evaluation and Treatment Rehabilitation  PERTINENT HISTORY: Lupus   PRECAUTIONS: None  ONSET DATE: April 2023  SUBJECTIVE: Patient reports more stiffness than pain in her knees today. Her R knee hurts worse than the L, which is still doing good since the injection.  PAIN:  Are you having pain? Yes: NPRS scale:  7/10 (R knee today) Pain location: L medial knee Pain description: Throbbing Aggravating factors: change of direction and unexpected Relieving factors: rest   OBJECTIVE: (objective measures completed at initial evaluation unless otherwise dated)   DIAGNOSTIC FINDINGS: 02/11/2022 5:31 PM EDT    1.  Complex tears of the anterior horns and bodies of the medial and lateral menisci. 2.  Small joint effusion with synovitis. 3.  High-grade chondrosis along the weightbearing surfaces of the medial and lateral compartments. 4.  Mild edema within the posterior compartment musculature, nonspecific.   PATIENT SURVEYS:  LEFS 24/80   COGNITION:           Overall cognitive status: Within functional limits for tasks assessed                          SENSATION: WFL   EDEMA:  Trace effusion suspected   MUSCLE LENGTH: Hamstrings: Right WFL B   POSTURE:  unremarkable    PALPATION: Tender to  medial and lateral joint lines    LOWER EXTREMITY ROM:   A/PROM Right eval Left eval  Hip flexion 90 90  Hip extension      Hip abduction      Hip adduction      Hip internal rotation      Hip external rotation      Knee flexion 110d 105d  Knee extension +5d -5d/+5d  Ankle dorsiflexion      Ankle plantarflexion      Ankle inversion      Ankle eversion       (Blank rows = not tested)   LOWER EXTREMITY MMT:   MMT Right eval Left eval  Hip flexion 4 4  Hip extension 4 4  Hip abduction 4 4  Hip adduction      Hip internal rotation      Hip external rotation      Knee flexion 4 4  Knee extension 4 4-  Ankle dorsiflexion      Ankle plantarflexion 4 4  Ankle inversion      Ankle eversion       (Blank rows = not tested)   LOWER EXTREMITY SPECIAL TESTS:  Knee special tests: stable to varus/valgus stressing   FUNCTIONAL TESTS:  5 times sit to stand: 30s arms crossed   GAIT: Distance walked: [id number] Assistive device utilized: Single point cane Level of assistance: Modified  independence Comments: antalgic gait pattern       TODAY'S TREATMENT: [facility] Adult PT Treatment:                                                DATE: 04/15/22 Therapeutic Exercise: Nustep level 4 x 6 mins Quad set 3s hold 10x SLR 10x SAQs x10 Bridges 10x Squats with chair touch from elevated surface 5x  [facility] Adult PT Treatment:                                                DATE: 04/05/2022 Therapeutic Exercise: Nustep level 5 x 6 mins Seated marching 2x10 BIL LAQ 2# 2x10 BIL Seated hamstring curl GTB 2x10 BIL Seated heel/toe raises 2x15 each Supine quad set 5" hold 2x10 BIL Supine SLR 2x10 BIL (small range) Bridges 2x10 small range Clamshells GTB 2x15 Supine marching GTB 2x10 BIL Supine hip adduction ball squeeze 5" hold 2x10 STS arms crossed x10  [facility] Adult PT Treatment:                                                DATE: 03/29/2022 Therapeutic Exercise: Nustep level 4 x 6 mins Seated marching 2x10 BIL LAQ 2x10 BIL Seated hamstring curl GTB 2x10 BIL Seated heel/toe raises 2x10 each Supine quad set 5" hold 2x10 BIL Supine SLR x10 BIL (small range) Clamshells GTB 2x10 Supine marching GTB 2x10 BIL Supine hip adduction ball squeeze 5" hold 2x10 STS arms crossed x10  03/22/2022: Eval     PATIENT EDUCATION:  Education details: Discussed eval findings, rehab rationale and POC and patient is in agreement  Person educated: Patient Education method: EPress photographer  comprehension: verbalized understanding and needs further education     HOME EXERCISE PROGRAM: Access Code: [email or web address] URL: [email or web address] Date: 04/15/2022 Prepared by: Sharlynn Oliphant  Exercises - Supine Quad Set  - 1 x daily - 3 x weekly - 2 sets - 15 reps - 3s hold - Supine Straight Leg Raises  - 1 x daily - 3 x weekly - 2 sets - 15 reps - Supine Bridge  - 1 x daily - 3 x weekly - 2 sets - 15 reps - Squat with Chair Touch  - 1 x daily - 7 x weekly - 1 sets - 10  reps   ASSESSMENT:   CLINICAL IMPRESSION: Has not felt any sharp knee pain since injection and has been compliant with HEP   OBJECTIVE IMPAIRMENTS decreased activity tolerance, decreased knowledge of condition, difficulty walking, decreased ROM, decreased strength, and pain.    ACTIVITY LIMITATIONS lifting, squatting, and stairs   PERSONAL FACTORS Fitness, Past/current experiences, Time since onset of injury/illness/exacerbation, and 1 comorbidity: Lupus  are also affecting patient's functional outcome.    REHAB POTENTIAL: Fair based on meniscal pathology   CLINICAL DECISION MAKING: Stable/uncomplicated   EVALUATION COMPLEXITY: Low     GOALS: Goals reviewed with patient? Yes   SHORT TERM GOALS=LONG TERM GOALS: Target date: 04/19/2022  Patient to demonstrate independence in HEP  Baseline:[id number] Goal status: MET Patient reports adherence 04/06/22   2.  Decrease average pain to 4/10 Baseline: Reports 0/10 pain currently, has not felt sharp pain since injection Goal status: Met   3.  Increase L knee extensor strength to 4/5 Baseline: 4-/5; 04/15/22 4/5 as observed from squatting Goal status: Met   4.  Increase AROM L knee to 0d Baseline: L knee active extension -5d; 04/15/22 0d Goal status: Met   5.  Increase LEFS score to 34/80 Baseline: 24/80; 04/15/22 59/80 Goal status: Met     PLAN: PT FREQUENCY: 1x/week   PT DURATION: 4 weeks   PLANNED INTERVENTIONS: Therapeutic exercises, Therapeutic activity, Neuromuscular re-education, Balance training, Gait training, Patient/Family education, Self Care, Joint mobilization, Stair training, DME instructions, Taping, Manual therapy, and Re-evaluation   PLAN FOR NEXT SESSION: HEP review and update, CKC tasks, ROM, LE strengthening    Lanice Shirts, PT 04/15/2022, 12:55 PM

## 2022-04-15 ENCOUNTER — Ambulatory Visit: Payer: Medicaid Other

## 2022-04-15 DIAGNOSIS — M25562 Pain in left knee: Secondary | ICD-10-CM | POA: Diagnosis not present

## 2022-04-15 DIAGNOSIS — R2689 Other abnormalities of gait and mobility: Secondary | ICD-10-CM

## 2022-04-15 DIAGNOSIS — M23304 Other meniscus derangements, unspecified medial meniscus, left knee: Secondary | ICD-10-CM

## 2022-04-17 ENCOUNTER — Other Ambulatory Visit: Payer: Self-pay | Admitting: Cardiology

## 2022-04-17 DIAGNOSIS — I5032 Chronic diastolic (congestive) heart failure: Secondary | ICD-10-CM

## 2022-04-17 DIAGNOSIS — I10 Essential (primary) hypertension: Secondary | ICD-10-CM

## 2022-04-18 ENCOUNTER — Other Ambulatory Visit: Payer: Self-pay | Admitting: Endocrinology

## 2022-04-19 ENCOUNTER — Telehealth: Payer: Self-pay

## 2022-04-19 DIAGNOSIS — E1165 Type 2 diabetes mellitus with hyperglycemia: Secondary | ICD-10-CM

## 2022-04-19 NOTE — Telephone Encounter (Signed)
Pharmacy states victoza is on backorder. Please advise

## 2022-04-20 ENCOUNTER — Other Ambulatory Visit: Payer: Self-pay

## 2022-04-20 MED ORDER — TRULICITY 1.5 MG/0.5ML ~~LOC~~ SOAJ: 1.5000 mg | SUBCUTANEOUS | 0 refills | Status: DC

## 2022-04-21 MED ORDER — TRULICITY 1.5 MG/0.5ML ~~LOC~~ SOAJ: 1.5000 mg | SUBCUTANEOUS | 0 refills | Status: DC

## 2022-04-21 NOTE — Addendum Note (Signed)
Addended by: Cinda Quest on: 04/21/2022 03:55 PM   Modules accepted: Orders

## 2022-04-27 ENCOUNTER — Other Ambulatory Visit: Payer: Self-pay | Admitting: Endocrinology

## 2022-04-27 ENCOUNTER — Ambulatory Visit (INDEPENDENT_AMBULATORY_CARE_PROVIDER_SITE_OTHER): Payer: Medicaid Other | Admitting: Endocrinology

## 2022-04-27 ENCOUNTER — Encounter: Payer: Self-pay | Admitting: Endocrinology

## 2022-04-27 VITALS — BP 144/90 | HR 96 | Ht 68.0 in | Wt 345.6 lb

## 2022-04-27 DIAGNOSIS — Z794 Long term (current) use of insulin: Secondary | ICD-10-CM

## 2022-04-27 DIAGNOSIS — E1165 Type 2 diabetes mellitus with hyperglycemia: Secondary | ICD-10-CM

## 2022-04-27 LAB — BASIC METABOLIC PANEL
BUN: 11 mg/dL (ref 6–23)
CO2: 21 mEq/L (ref 19–32)
Calcium: 9.4 mg/dL (ref 8.4–10.5)
Chloride: 111 mEq/L (ref 96–112)
Creatinine, Ser: 0.94 mg/dL (ref 0.40–1.20)
GFR: 70.18 mL/min (ref >60.00)
Glucose, Bld: 256 mg/dL — ABNORMAL HIGH (ref 70–99)
Potassium: 4.2 mEq/L (ref 3.5–5.1)
Sodium: 140 mEq/L (ref 135–145)

## 2022-04-27 MED ORDER — OZEMPIC (1 MG/DOSE) 4 MG/3ML ~~LOC~~ SOPN: PEN_INJECTOR | SUBCUTANEOUS | 0 refills | Status: DC

## 2022-04-27 NOTE — Progress Notes (Signed)
Patient ID: [name], female   DOB: Sep 09, 1970, 51 y.o.   MRN: [id number]           Reason for Appointment: Type II Diabetes follow-up   History of Present Illness   Diagnosis date: 2011  Previous history:  Non-insulin hypoglycemic drugs previously used: Metformin, Victoza, Trulicity, Farxiga, Byetta Insulin was started in 2014 She has previously used Lantus, Levemir and since about 2019 has used 270 units of NPH once a day  A1c range in the last few years is: 6.4-13.4  Recent history:     Non-insulin hypoglycemic drugs: Ozempic 0.5 mg weekly, metformin ER 1000 mg twice daily,      Insulin regimen: Humulin R U-500, 100 tid before meals    Side effects from medications: Shortness of breath from [place]  Current self management, blood sugar patterns and problems identified:  A1c is 8.1 last She was switched to Ozempic on the last visit but because of insurance difficulties she did not start this only until recently this month and has taken 2 or 3 injections She lost her meter about a month ago and does not know what her blood sugars are Random blood sugar today is 230 in the office No hypoglycemic symptoms with starting U-500 insulin She is asking about Trulicity which apparently was also filled by the pharmacy recently Recent weight is the same  Exercise: Minimal Diet management: Not following any meal plan     Hypoglycemia:  none    Glucometer:    One Touch Verio      Blood Glucose readings by recall previously:  PRE-MEAL Fasting Lunch Dinner Bedtime Overall  Glucose range: 150 160 180 200-303   Mean/median:         Dietician visit: Most recent: Years ago     Weight control:  Wt Readings from Last 3 Encounters:  04/27/22 (!) 345 lb 9.6 oz (156.8 kg)  03/21/22 (!) 340 lb (154.2 kg)  02/24/22 (!) 345 lb 9.6 oz (156.8 kg)            Diabetes labs:   Lab Results  Component Value Date   HGBA1C 8.1 (H) 02/21/2022   HGBA1C 8.2 (A) 11/22/2021   HGBA1C  8.3 (A) 09/08/2021   Lab Results  Component Value Date   MICROALBUR 7.0 (H) 02/21/2022   LDLCALC 45 02/21/2022   CREATININE 1.19 (H) 02/25/2022    Lab Results  Component Value Date   FRUCTOSAMINE 351 (H) 06/18/2021   FRUCTOSAMINE 240 09/18/2015     Allergies as of 04/27/2022       Reactions   Metoclopramide Other (See Comments)   Developed restless leg, akathisia type limb movements.    Codeine Itching, Rash, Other (See Comments)   Hydrocodone Rash   Penicillins Itching   Tolerated Unasyn 02/01/2020   Percocet [oxycodone-acetaminophen] Hives, Rash   Tolerates dilaudid Tolerates acetaminophen         Medication List        Accurate as of April 27, 2022 11:10 AM. If you have any questions, ask your nurse or doctor.          Accu-Chek Aviva Plus test strip Generic drug: glucose blood USE TO TEST BLOOD SUGAR 4 TIMES DAILY   Accu-Chek Guide test strip Generic drug: glucose blood 1 each by Other route 2 (two) times daily. And lancets 2/day   OneTouch Ultra test strip Generic drug: glucose blood Test blood sugar 4 times a day   Advair HFA 230-21 MCG/ACT inhaler Generic  drug: fluticasone-salmeterol Inhale 1-2 puffs into the lungs 2 (two) times daily.   albuterol (2.5 MG/3ML) 0.083% nebulizer solution Commonly known as: PROVENTIL Take 2.5 mg by nebulization every 6 (six) hours as needed for wheezing.   ALPRAZolam 0.5 MG tablet Commonly known as: XANAX Take 0.5 mg 2 (two) times daily as needed by mouth for anxiety or sleep.   amLODipine 10 MG tablet Commonly known as: NORVASC TAKE 1 TABLET BY MOUTH EVERY DAY   ARIPiprazole 5 MG tablet Commonly known as: ABILIFY Take 5 mg by mouth daily.   Azathioprine 75 MG Tabs Take 150 mg by mouth 2 (two) times daily.   B-D UF III MINI PEN NEEDLES 31G X 5 MM Misc Generic drug: Insulin Pen Needle USE AS DIRECTED TWICE A DAY   Easy Touch Pen Needles 32G X 4 MM Misc Generic drug: Insulin Pen Needle USE TO  INJECT INSULIN TWICE DAILY   Benlysta 200 MG/ML Soaj Generic drug: Belimumab Inject 200 mg into the skin once a week. Thursdays   buPROPion 300 MG 24 hr tablet Commonly known as: WELLBUTRIN XL Take 300 mg by mouth daily.   carvedilol 25 MG tablet Commonly known as: COREG Take 1.5 tablets (37.5 mg total) by mouth 2 (two) times daily with a meal.   cromolyn 4 % ophthalmic solution Commonly known as: OPTICROM Place 1 drop into both eyes 2 (two) times daily.   cycloSPORINE 0.05 % ophthalmic emulsion Commonly known as: RESTASIS Place 1 drop into both eyes in the morning, at noon, and at bedtime.   diclofenac Sodium 1 % Gel Commonly known as: VOLTAREN APPLY 2 GRAMS TO AFFECTED AREA 4 TIMES A DAY   DULoxetine 60 MG capsule Commonly known as: CYMBALTA Take 120 mg by mouth at bedtime.   Eliquis 5 MG Tabs tablet Generic drug: apixaban Take 1 tablet (5 mg total) by mouth 2 (two) times daily.   empagliflozin 10 MG Tabs tablet Commonly known as: Jardiance Take 1 tablet (10 mg total) by mouth daily with breakfast.   Entresto 97-103 MG Generic drug: sacubitril-valsartan TAKE 1 TABLET BY MOUTH TWICE A DAY   famotidine 20 MG tablet Commonly known as: PEPCID Take 20 mg by mouth 2 (two) times daily.   fluticasone 50 MCG/ACT nasal spray Commonly known as: FLONASE Place 2 sprays into both nostrils continuous as needed for allergies or rhinitis.   HumuLIN R U-500 KwikPen 500 UNIT/ML KwikPen Generic drug: insulin regular human CONCENTRATED 100 Units, 30 minutes before each meal   hydroxychloroquine 200 MG tablet Commonly known as: PLAQUENIL Take 200 mg by mouth 2 (two) times daily.   Insulin Syringe-Needle U-100 31G X 5/16" 1 ML Misc Commonly known as: Easy Touch Insulin Syringe USE AS DIRECTED THREE TIMES A DAY   isosorbide-hydrALAZINE 20-37.5 MG tablet Commonly known as: BiDil TAKE TWO (2) TABLETS BY MOUTH THREE TIMES DAILY   levothyroxine 50 MCG tablet Commonly known  as: SYNTHROID Take 1 tablet (50 mcg total) by mouth daily at 6 (six) AM.   megestrol 40 MG tablet Commonly known as: MEGACE Take 40 mg by mouth daily.   metFORMIN 500 MG 24 hr tablet Commonly known as: GLUCOPHAGE-XR Take 2 tablets (1,000 mg total) by mouth 2 (two) times daily.   multivitamin with minerals Tabs tablet Take 1 tablet by mouth daily.   ONE TOUCH ULTRA 2 w/Device Kit Check blood sugar daily   onetouch ultrasoft lancets Test blood sugar 4 times a day   oxybutynin 15 MG 24 hr  tablet Commonly known as: DITROPAN XL Take 15 mg by mouth daily.   pregabalin 75 MG capsule Commonly known as: LYRICA Take 75 mg 3 (three) times daily by mouth.   QUEtiapine 300 MG tablet Commonly known as: SEROQUEL Take 300 mg by mouth at bedtime.   rosuvastatin 20 MG tablet Commonly known as: CRESTOR Take 1 tablet (20 mg total) by mouth daily.   spironolactone 50 MG tablet Commonly known as: ALDACTONE TAKE 1 TABLET BY MOUTH EVERY DAY   sucralfate 1 GM/10ML suspension Commonly known as: CARAFATE Take 10 mLs by mouth 2 (two) times daily.   topiramate 25 MG tablet Commonly known as: TOPAMAX Take 75 mg by mouth at bedtime.   torsemide 20 MG tablet Commonly known as: DEMADEX Take 1 tablet (20 mg total) by mouth at bedtime.   Transderm-Scop 1 MG/3DAYS Generic drug: scopolamine 1 patch every 3 (three) days.   trimethobenzamide 300 MG capsule Commonly known as: Tigan Take 1 capsule (300 mg total) by mouth 3 (three) times daily as needed for nausea/vomiting. Do not take with the Scopolamine unless nausea is not improving   Trulicity 1.5 IF/0.2DX Sopn Generic drug: Dulaglutide Inject 1.5 mg into the skin once a week.   Trulicity 1.5 AJ/2.8NO Sopn Generic drug: Dulaglutide Inject 1.5 mg into the skin once a week.        Allergies:  Allergies  Allergen Reactions   Metoclopramide Other (See Comments)    Developed restless leg, akathisia type limb movements.    Codeine  Itching, Rash and Other (See Comments)   Hydrocodone Rash   Penicillins Itching    Tolerated Unasyn 02/01/2020   Percocet [Oxycodone-Acetaminophen] Hives and Rash    Tolerates dilaudid Tolerates acetaminophen     Past Medical History:  Diagnosis Date   Acute renal failure (HCC)     Intractable nausea vomiting secondary to diabetic gastroparesis causing dehydration and acute renal failure /notes 04/01/2013   Anginal pain ([place])    Anxiety    Bell's palsy 08/09/2010   Bicornuate uterus    CAP (community acquired pneumonia) 10/2011   Archie Endo 11/08/2011   Chest pain 01/13/2014   CHF (congestive heart failure) ([place])    Diabetic gastroparesis associated with type 2 diabetes mellitus ([place])    this is presumed diagnoses, not confirmed by any studies.    Eczema    Family history of breast cancer    Family history of malignant neoplasm of breast    Family history of malignant neoplasm of ovary    Family history of ovarian cancer    Family history of prostate cancer    GERD (gastroesophageal reflux disease)    High cholesterol    Hypertension    Liver lesion 08/13/2019   Liver mass 2014   biopsied 03/2013 at [facility], not malignant.  is to undergo radiologic ablation of the mass in sept/October 2014.    Lupus ([place])    Migraines    "maybe a couple times/yr" (04/01/2013)   Obesity    Obstructive sleep apnea on CPAP 2011   Oxygen at nights   SJOGREN'S SYNDROME 08/09/2010   SLE (systemic lupus erythematosus) ([place])    Archie Endo 12/01/2010    Past Surgical History:  Procedure Laterality Date   BREAST CYST EXCISION Left 08/2005   epidermoid   CARDIAC CATHETERIZATION  02/07/2012   ECTOPIC PREGNANCY SURGERY  1999   ESOPHAGOGASTRODUODENOSCOPY N/A 02/26/2013   Procedure: ESOPHAGOGASTRODUODENOSCOPY (EGD);  Surgeon: Irene Shipper, MD;  Location: [facility] ENDOSCOPY;  Service: Endoscopy;  Laterality: N/A;   ESOPHAGOGASTRODUODENOSCOPY N/A 03/02/2015   Procedure: ESOPHAGOGASTRODUODENOSCOPY (EGD);   Surgeon: Milus Banister, MD;  Location: [place];  Service: Endoscopy;  Laterality: N/A;   ESOPHAGOGASTRODUODENOSCOPY N/A 02/23/2021   Procedure: ESOPHAGOGASTRODUODENOSCOPY (EGD);  Surgeon: Wilford Corner, MD;  Location: [place];  Service: Endoscopy;  Laterality: N/A;  possible dilation   HERNIA REPAIR     LEFT AND RIGHT HEART CATHETERIZATION WITH CORONARY ANGIOGRAM N/A 02/07/2012   Procedure: LEFT AND RIGHT HEART CATHETERIZATION WITH CORONARY ANGIOGRAM;  Surgeon: Laverda Page, MD;  Location: [facility] CATH LAB;  Service: Cardiovascular;  Laterality: N/A;   LIVER BIOPSY  03/2013   liver mass/medical hx noted above   MUSCLE BIOPSY     for lupus/notes 1/0/6269   UMBILICAL HERNIA REPAIR  30's    Family History  Problem Relation Age of Onset   Diabetes Mother    Asthma Mother    Urticaria Mother    Hypertension Mother    Stroke Mother    Ovarian cancer Sister 57       maternal half-sister; RAD51C positive   Asthma Brother    Asthma Brother    Breast cancer Maternal Aunt 60       currently 21   Cancer Maternal Aunt        unk. primary   Cancer Maternal Aunt        unk. primary   Ovarian cancer Maternal Aunt        deceased 31s   Breast cancer Maternal Aunt        deceased 71s   Breast cancer Maternal Aunt    Stomach cancer Maternal Uncle        dx 39s; deceased   Prostate cancer Maternal Uncle        deceased 60s   Breast cancer Cousin        deceased 55s; daughter of mat uncle with stomach ca   Hypertension Other    Stroke Other    Arthritis Other    Allergic rhinitis Neg Hx    Angioedema Neg Hx     Social History:  reports that she quit smoking about 30 years ago. Her smoking use included cigarettes. She has a 1.50 pack-year smoking history. She has never used smokeless tobacco. She reports that she does not drink alcohol and does not use drugs.  Review of Systems:  Last diabetic eye exam date unknown  Last foot exam date: 8/22  Symptoms of  neuropathy: Have occasional numbness in her feet  Hypertension:   Treatment includes Entresto  BP Readings from Last 3 Encounters:  04/27/22 (!) 144/90  03/21/22 (!) 140/82  02/25/22 122/70    Lipid management: Controlled with 20 mg of Crestor    Lab Results  Component Value Date   CHOL 93 02/21/2022   CHOL 203 (H) 05/22/2021   CHOL 160 03/06/2014   Lab Results  Component Value Date   HDL 37.00 (L) 02/21/2022   HDL 58 05/22/2021   HDL 57 03/06/2014   Lab Results  Component Value Date   LDLCALC 45 02/21/2022   LDLCALC 117 (H) 05/22/2021   LDLCALC 78 03/06/2014   Lab Results  Component Value Date   TRIG 55.0 02/21/2022   TRIG 138 05/22/2021   TRIG 125 03/06/2014   Lab Results  Component Value Date   CHOLHDL 3 02/21/2022   CHOLHDL 3.5 05/22/2021   CHOLHDL 2.8 03/06/2014   No results found for: "LDLDIRECT"  THYROID: She is on levothyroxine 50 mcg  daily for at least a couple of years with baseline TSH of 7.6 TSH is recently normal   Lab Results  Component Value Date   TSH 4.11 02/21/2022   TSH 2.871 05/21/2021   TSH 7.630 (H) 08/24/2019   FREET4 0.80 02/21/2022   History of CHF but echocardiogram in 2/23 showed normal functions    Examination:   BP (!) 144/90   Pulse 96   Ht 5' 8"  (1.727 m)   Wt (!) 345 lb 9.6 oz (156.8 kg)   LMP 03/24/2017   SpO2 99%   BMI 52.55 kg/m   Body mass index is 52.55 kg/m.    ASSESSMENT/ PLAN:    Diabetes type 2 with severe obesity and insulin-dependent:   Current regimen: Humulin R 3 times a day, metformin and Ozempic  See history of present illness for detailed discussion of current diabetes management, blood sugar patterns and problems identified  A1c is 8.1 last Previously on nearly 300 units of NPH today Difficult to assess her blood sugar control as she has not monitored her blood sugar and she did not come for labs as instructed Still having difficulty losing weight  Recommendations:  Will send  prescription for freestyle libre 3 for better monitoring of blood sugars Given new glucose meter also She needs to check blood sugars 3-4 times a day After finishing 0.5 she will go dose 1 mg of Ozempic weekly Check fructosamine today   There are no Patient Instructions on file for this visit.   Elayne Snare 04/27/2022, 11:10 AM   Total visit time for evaluation and management, review of relevant records and labs and counseling = 40 minutes

## 2022-04-28 LAB — FRUCTOSAMINE: Fructosamine: 364 umol/L — ABNORMAL HIGH (ref 0–285)

## 2022-05-03 ENCOUNTER — Telehealth: Payer: Self-pay | Admitting: Hematology and Oncology

## 2022-05-03 NOTE — Telephone Encounter (Signed)
Per 10/3 phone line pt called to r/s injection

## 2022-05-04 ENCOUNTER — Inpatient Hospital Stay: Payer: Medicaid Other

## 2022-05-09 ENCOUNTER — Emergency Department (HOSPITAL_COMMUNITY): Payer: Medicaid Other

## 2022-05-09 ENCOUNTER — Inpatient Hospital Stay (HOSPITAL_COMMUNITY)
Admission: EM | Admit: 2022-05-09 | Discharge: 2022-05-14 | DRG: 392 | Disposition: A | Discharge status: Home / Self Care | Payer: Medicaid Other | Attending: Internal Medicine | Admitting: Internal Medicine

## 2022-05-09 ENCOUNTER — Inpatient Hospital Stay: Payer: Medicaid Other | Attending: Physician Assistant

## 2022-05-09 ENCOUNTER — Encounter (HOSPITAL_COMMUNITY): Payer: Self-pay | Admitting: Emergency Medicine

## 2022-05-09 DIAGNOSIS — E876 Hypokalemia: Secondary | ICD-10-CM | POA: Diagnosis present

## 2022-05-09 DIAGNOSIS — I5022 Chronic systolic (congestive) heart failure: Secondary | ICD-10-CM | POA: Diagnosis present

## 2022-05-09 DIAGNOSIS — E785 Hyperlipidemia, unspecified: Secondary | ICD-10-CM | POA: Diagnosis not present

## 2022-05-09 DIAGNOSIS — E039 Hypothyroidism, unspecified: Secondary | ICD-10-CM | POA: Diagnosis present

## 2022-05-09 DIAGNOSIS — M3500 Sicca syndrome, unspecified: Secondary | ICD-10-CM | POA: Diagnosis present

## 2022-05-09 DIAGNOSIS — I2510 Atherosclerotic heart disease of native coronary artery without angina pectoris: Secondary | ICD-10-CM | POA: Diagnosis present

## 2022-05-09 DIAGNOSIS — Z7985 Long-term (current) use of injectable non-insulin antidiabetic drugs: Secondary | ICD-10-CM

## 2022-05-09 DIAGNOSIS — E1143 Type 2 diabetes mellitus with diabetic autonomic (poly)neuropathy: Secondary | ICD-10-CM | POA: Diagnosis present

## 2022-05-09 DIAGNOSIS — I110 Hypertensive heart disease with heart failure: Secondary | ICD-10-CM | POA: Diagnosis present

## 2022-05-09 DIAGNOSIS — K3184 Gastroparesis: Secondary | ICD-10-CM | POA: Diagnosis present

## 2022-05-09 DIAGNOSIS — Z86711 Personal history of pulmonary embolism: Secondary | ICD-10-CM

## 2022-05-09 DIAGNOSIS — T383X5A Adverse effect of insulin and oral hypoglycemic [antidiabetic] drugs, initial encounter: Secondary | ICD-10-CM | POA: Diagnosis present

## 2022-05-09 DIAGNOSIS — Z794 Long term (current) use of insulin: Secondary | ICD-10-CM

## 2022-05-09 DIAGNOSIS — Z833 Family history of diabetes mellitus: Secondary | ICD-10-CM

## 2022-05-09 DIAGNOSIS — I10 Essential (primary) hypertension: Secondary | ICD-10-CM | POA: Diagnosis not present

## 2022-05-09 DIAGNOSIS — R9431 Abnormal electrocardiogram [ECG] [EKG]: Secondary | ICD-10-CM

## 2022-05-09 DIAGNOSIS — Z79899 Other long term (current) drug therapy: Secondary | ICD-10-CM

## 2022-05-09 DIAGNOSIS — F419 Anxiety disorder, unspecified: Secondary | ICD-10-CM | POA: Diagnosis present

## 2022-05-09 DIAGNOSIS — Z8249 Family history of ischemic heart disease and other diseases of the circulatory system: Secondary | ICD-10-CM

## 2022-05-09 DIAGNOSIS — Z7901 Long term (current) use of anticoagulants: Secondary | ICD-10-CM

## 2022-05-09 DIAGNOSIS — K449 Diaphragmatic hernia without obstruction or gangrene: Secondary | ICD-10-CM | POA: Diagnosis present

## 2022-05-09 DIAGNOSIS — Z885 Allergy status to narcotic agent status: Secondary | ICD-10-CM

## 2022-05-09 DIAGNOSIS — F32A Depression, unspecified: Secondary | ICD-10-CM | POA: Diagnosis present

## 2022-05-09 DIAGNOSIS — E7800 Pure hypercholesterolemia, unspecified: Secondary | ICD-10-CM | POA: Diagnosis present

## 2022-05-09 DIAGNOSIS — Z7984 Long term (current) use of oral hypoglycemic drugs: Secondary | ICD-10-CM

## 2022-05-09 DIAGNOSIS — K219 Gastro-esophageal reflux disease without esophagitis: Secondary | ICD-10-CM | POA: Diagnosis present

## 2022-05-09 DIAGNOSIS — Z6841 Body Mass Index (BMI) 40.0 and over, adult: Secondary | ICD-10-CM

## 2022-05-09 DIAGNOSIS — Z79890 Hormone replacement therapy (postmenopausal): Secondary | ICD-10-CM

## 2022-05-09 DIAGNOSIS — R112 Nausea with vomiting, unspecified: Secondary | ICD-10-CM | POA: Diagnosis not present

## 2022-05-09 DIAGNOSIS — M329 Systemic lupus erythematosus, unspecified: Secondary | ICD-10-CM | POA: Diagnosis present

## 2022-05-09 DIAGNOSIS — Z880 Allergy status to penicillin: Secondary | ICD-10-CM

## 2022-05-09 DIAGNOSIS — G4733 Obstructive sleep apnea (adult) (pediatric): Secondary | ICD-10-CM | POA: Diagnosis present

## 2022-05-09 DIAGNOSIS — Z888 Allergy status to other drugs, medicaments and biological substances status: Secondary | ICD-10-CM

## 2022-05-09 LAB — COMPREHENSIVE METABOLIC PANEL
ALT: 12 U/L (ref 0–44)
AST: 19 U/L (ref 15–41)
Albumin: 4 g/dL (ref 3.5–5.0)
Alkaline Phosphatase: 88 U/L (ref 38–126)
Anion gap: 13 (ref 5–15)
BUN: 11 mg/dL (ref 6–20)
CO2: 17 mmol/L — ABNORMAL LOW (ref 22–32)
Calcium: 9.9 mg/dL (ref 8.9–10.3)
Chloride: 113 mmol/L — ABNORMAL HIGH (ref 98–111)
Creatinine, Ser: 1.13 mg/dL — ABNORMAL HIGH (ref 0.44–1.00)
GFR, Estimated: 59 mL/min — ABNORMAL LOW (ref >60)
Glucose, Bld: 208 mg/dL — ABNORMAL HIGH (ref 70–99)
Potassium: 4 mmol/L (ref 3.5–5.1)
Sodium: 143 mmol/L (ref 135–145)
Total Bilirubin: 0.8 mg/dL (ref 0.3–1.2)
Total Protein: 7.4 g/dL (ref 6.5–8.1)

## 2022-05-09 LAB — CBC
HCT: 37.1 % (ref 36.0–46.0)
Hemoglobin: 12 g/dL (ref 12.0–15.0)
MCH: 29.6 pg (ref 26.0–34.0)
MCHC: 32.3 g/dL (ref 30.0–36.0)
MCV: 91.4 fL (ref 80.0–100.0)
Platelets: 313 10*3/uL (ref 150–400)
RBC: 4.06 MIL/uL (ref 3.87–5.11)
RDW: 15.9 % — ABNORMAL HIGH (ref 11.5–15.5)
WBC: 9 10*3/uL (ref 4.0–10.5)
nRBC: 0 % (ref 0.0–0.2)

## 2022-05-09 LAB — LIPASE, BLOOD: Lipase: 24 U/L (ref 11–51)

## 2022-05-09 LAB — TROPONIN I (HIGH SENSITIVITY)
Troponin I (High Sensitivity): 7 ng/L (ref <18)
Troponin I (High Sensitivity): 6 ng/L (ref <18)

## 2022-05-09 LAB — I-STAT BETA HCG BLOOD, ED (MC, WL, AP ONLY): I-stat hCG, quantitative: <5 m[IU]/mL (ref <5)

## 2022-05-09 MED ORDER — LORAZEPAM 2 MG/ML IJ SOLN
0.5000 mg | INTRAMUSCULAR | Status: DC | PRN
  Administered 2022-05-10 – 2022-05-11 (×6): 0.5 mg via INTRAVENOUS
  Filled 2022-05-09 (×6): qty 1

## 2022-05-09 MED ORDER — DIPHENHYDRAMINE HCL 50 MG/ML IJ SOLN
12.5000 mg | Freq: Once | INTRAMUSCULAR | Status: AC
  Administered 2022-05-09: 12.5 mg via INTRAVENOUS
  Filled 2022-05-09: qty 1

## 2022-05-09 MED ORDER — ACETAMINOPHEN 325 MG PO TABS: 650.0000 mg | ORAL_TABLET | Freq: Four times a day (QID) | ORAL | Status: DC | PRN

## 2022-05-09 MED ORDER — IOHEXOL 350 MG/ML SOLN
100.0000 mL | Freq: Once | INTRAVENOUS | Status: AC | PRN
  Administered 2022-05-09: 100 mL via INTRAVENOUS

## 2022-05-09 MED ORDER — SODIUM CHLORIDE 0.9 % IV BOLUS
1000.0000 mL | Freq: Once | INTRAVENOUS | Status: AC
  Administered 2022-05-09: 1000 mL via INTRAVENOUS

## 2022-05-09 MED ORDER — FAMOTIDINE IN NACL 20-0.9 MG/50ML-% IV SOLN
20.0000 mg | Freq: Once | INTRAVENOUS | Status: AC
  Administered 2022-05-09: 20 mg via INTRAVENOUS
  Filled 2022-05-09: qty 50

## 2022-05-09 MED ORDER — HYDROMORPHONE HCL 1 MG/ML IJ SOLN
1.0000 mg | Freq: Once | INTRAMUSCULAR | Status: AC
  Administered 2022-05-09: 1 mg via INTRAVENOUS
  Filled 2022-05-09: qty 1

## 2022-05-09 MED ORDER — SODIUM CHLORIDE 0.9 % IV BOLUS
1000.0000 mL | Freq: Once | INTRAVENOUS | Status: AC
  Administered 2022-05-10: 1000 mL via INTRAVENOUS

## 2022-05-09 MED ORDER — PROCHLORPERAZINE EDISYLATE 10 MG/2ML IJ SOLN
10.0000 mg | Freq: Once | INTRAMUSCULAR | Status: AC
  Administered 2022-05-09: 10 mg via INTRAVENOUS
  Filled 2022-05-09: qty 2

## 2022-05-09 MED ORDER — ENOXAPARIN SODIUM 40 MG/0.4ML IJ SOSY: 40.0000 mg | PREFILLED_SYRINGE | INTRAMUSCULAR | Status: DC

## 2022-05-09 MED ORDER — PANTOPRAZOLE SODIUM 40 MG IV SOLR
40.0000 mg | Freq: Once | INTRAVENOUS | Status: AC
  Administered 2022-05-09: 40 mg via INTRAVENOUS
  Filled 2022-05-09: qty 10

## 2022-05-09 MED ORDER — FENTANYL CITRATE PF 50 MCG/ML IJ SOSY
50.0000 ug | PREFILLED_SYRINGE | Freq: Once | INTRAMUSCULAR | Status: AC
  Administered 2022-05-09: 50 ug via INTRAVENOUS
  Filled 2022-05-09: qty 1

## 2022-05-09 MED ORDER — ACETAMINOPHEN 650 MG RE SUPP: 650.0000 mg | Freq: Four times a day (QID) | RECTAL | Status: DC | PRN

## 2022-05-09 MED ORDER — SODIUM CHLORIDE 0.9 % IV SOLN
25.0000 mg | Freq: Four times a day (QID) | INTRAVENOUS | Status: DC | PRN
  Administered 2022-05-09 – 2022-05-10 (×2): 25 mg via INTRAVENOUS
  Filled 2022-05-09 (×2): qty 1

## 2022-05-09 NOTE — ED Triage Notes (Signed)
Patient here with complaint of chest pain and vomiting that started yesterday.

## 2022-05-09 NOTE — ED Provider Triage Note (Signed)
Emergency Medicine Provider Triage Evaluation Note  [name] , a 51 y.o. female  was evaluated in triage.  Pt complains of abdominal pain, nausea and vomiting.  Nausea started yesterday, vomiting started today.  Patient has had too numerous episodes of emesis to count.  EMS gave her Zofran, she reports no relief.  She also started having some chest pain around 6 AM this morning.  No fever.  Review of Systems  Positive: Chest pain, abdominal pain, nausea, vomiting Negative: Diarrhea, fever  Physical Exam  BP (!) 152/87 (BP Location: Right Arm)   Pulse 93   Temp 97.8 F (36.6 C) (Oral)   Resp 20   LMP 03/24/2017   SpO2 100%  Gen:   Awake, no distress   Resp:  Normal effort  MSK:   Moves extremities without difficulty  Other:  Pt appears uncomfortable, emesis appears dark  Medical Decision Making  Medically screening exam initiated at 2:15 PM.  Appropriate orders placed.  MORELIA CASSELLS was informed that the remainder of the evaluation will be completed by another provider, this initial triage assessment does not replace that evaluation, and the importance of remaining in the ED until their evaluation is complete.  Workup initiated   Khalon Cansler T, PA-C 05/09/22 1417

## 2022-05-09 NOTE — ED Triage Notes (Signed)
Presents to ed via [facility] states she has had 24 hours of n/v /co chest pain onset 6 am generalized tenderness across chest . Patient was given zofran 4 mg IV and asa324 mg per ems

## 2022-05-09 NOTE — ED Provider Notes (Signed)
[facility] EMERGENCY DEPARTMENT Provider Note   CSN: [id number] Arrival date & time: 05/09/22  1251     History  Chief Complaint  Patient presents with   Abdominal Pain   Nausea   Emesis    [name] is a 51 y.o. female here presenting with abdominal pain and nausea and vomiting.  Patient states that she recently started on Ozempic for weight loss and diabetes.  She states that shortly afterwards, she started having vomiting.  She states that she is unable to keep anything down today.  She also has abdominal cramps.  She states that she has some chest pressure when she vomits.  The history is provided by the patient.       Home Medications Prior to Admission medications   Medication Sig Start Date End Date Taking? Authorizing Provider  ACCU-CHEK AVIVA PLUS test strip USE TO TEST BLOOD SUGAR 4 TIMES DAILY 05/05/21   Renato Shin, MD  ADVAIR [facility] 230-21 MCG/ACT inhaler Inhale 1-2 puffs into the lungs 2 (two) times daily. 12/20/20   [provider]  albuterol (PROVENTIL) (2.5 MG/3ML) 0.083% nebulizer solution Take 2.5 mg by nebulization every 6 (six) hours as needed for wheezing.    [provider]  ALPRAZolam Duanne Moron) 0.5 MG tablet Take 0.5 mg 2 (two) times daily as needed by mouth for anxiety or sleep.  04/19/17   [provider]  amLODipine (NORVASC) 10 MG tablet TAKE 1 TABLET BY MOUTH EVERY DAY 04/18/22   Adrian Prows, MD  apixaban (ELIQUIS) 5 MG TABS tablet Take 1 tablet (5 mg total) by mouth 2 (two) times daily. 05/27/21   Jose Persia, MD  ARIPiprazole (ABILIFY) 5 MG tablet Take 5 mg by mouth daily. 12/08/20   [provider]  Azathioprine 75 MG TABS Take 150 mg by mouth 2 (two) times daily. 02/04/21   [provider]  B-D UF III MINI PEN NEEDLES 31G X 5 MM MISC USE AS DIRECTED TWICE A DAY 10/31/18   Renato Shin, MD  Belimumab (BENLYSTA) 200 MG/ML SOAJ Inject 200 mg into the skin once a week. Thursdays 12/19/19    [provider]  Blood Glucose Monitoring Suppl (ONE TOUCH ULTRA 2) w/Device KIT Check blood sugar daily 03/30/22   Elayne Snare, MD  buPROPion (WELLBUTRIN XL) 300 MG 24 hr tablet Take 300 mg by mouth daily. 03/14/22   [provider]  carvedilol (COREG) 25 MG tablet Take 1.5 tablets (37.5 mg total) by mouth 2 (two) times daily with a meal. 01/01/21   Adrian Prows, MD  cromolyn (OPTICROM) 4 % ophthalmic solution Place 1 drop into both eyes 2 (two) times daily. 01/14/21   [provider]  cycloSPORINE (RESTASIS) 0.05 % ophthalmic emulsion Place 1 drop into both eyes in the morning, at noon, and at bedtime.     [provider]  diclofenac Sodium (VOLTAREN) 1 % GEL APPLY 2 GRAMS TO AFFECTED AREA 4 TIMES A DAY 01/11/22   Cantwell, Celeste C, PA-C  DULoxetine (CYMBALTA) 60 MG capsule Take 120 mg by mouth at bedtime. 08/31/20   [provider]  EASY TOUCH PEN NEEDLES 32G X 4 MM MISC USE TO INJECT INSULIN TWICE DAILY 08/07/19   Renato Shin, MD  empagliflozin (JARDIANCE) 10 MG TABS tablet Take 1 tablet (10 mg total) by mouth daily with breakfast. 02/24/22   Elayne Snare, MD  ENTRESTO 97-103 MG TAKE 1 TABLET BY MOUTH TWICE A DAY 04/18/22   Adrian Prows, MD  famotidine (PEPCID) 20 MG tablet Take 20 mg by mouth 2 (two) times daily.    [provider]  fluticasone (FLONASE) 50 MCG/ACT nasal spray Place 2 sprays into both nostrils continuous as needed for allergies or rhinitis.    [provider]  glucose blood (ACCU-CHEK GUIDE) test strip 1 each by Other route 2 (two) times daily. And lancets 2/day 06/18/21   Renato Shin, MD  glucose blood [facility] ULTRA) test strip Test blood sugar 4 times a day 03/30/22   Elayne Snare, MD  hydroxychloroquine (PLAQUENIL) 200 MG tablet Take 200 mg by mouth 2 (two) times daily.    [provider]  insulin regular human CONCENTRATED (HUMULIN R U-500 KWIKPEN) 500 UNIT/ML KwikPen 100 Units, 30 minutes before each meal 02/24/22    Elayne Snare, MD  Insulin Syringe-Needle U-100 (EASY TOUCH INSULIN SYRINGE) 31G X 5/16" 1 ML MISC USE AS DIRECTED THREE TIMES A DAY 10/12/18   Renato Shin, MD  isosorbide-hydrALAZINE (BIDIL) 20-37.5 MG tablet TAKE TWO (2) TABLETS BY MOUTH THREE TIMES DAILY 10/21/21   Cantwell, Celeste C, PA-C  Lancets (ONETOUCH ULTRASOFT) lancets Test blood sugar 4 times a day 03/30/22   Elayne Snare, MD  levothyroxine (SYNTHROID) 50 MCG tablet Take 1 tablet (50 mcg total) by mouth daily at 6 (six) AM. 08/30/19   Raiford Noble Latif, DO  megestrol (MEGACE) 40 MG tablet Take 40 mg by mouth daily. 01/30/15   [provider]  metFORMIN (GLUCOPHAGE-XR) 500 MG 24 hr tablet Take 2 tablets (1,000 mg total) by mouth 2 (two) times daily. 02/02/21   Renato Shin, MD  Multiple Vitamin (MULTIVITAMIN WITH MINERALS) TABS tablet Take 1 tablet by mouth daily. 08/30/19   Raiford Noble Latif, DO  oxybutynin (DITROPAN XL) 15 MG 24 hr tablet Take 15 mg by mouth daily.  08/30/17   [provider]  pregabalin (LYRICA) 75 MG capsule Take 75 mg 3 (three) times daily by mouth.  01/28/15   [provider]  QUEtiapine (SEROQUEL) 300 MG tablet Take 300 mg by mouth at bedtime. 01/08/21   [provider]  rosuvastatin (CRESTOR) 20 MG tablet Take 1 tablet (20 mg total) by mouth daily. 06/09/21 09/20/21  Cantwell, Celeste C, PA-C  Semaglutide, 1 MG/DOSE, (OZEMPIC, 1 MG/DOSE,) 4 MG/3ML SOPN Inject 1 mg weekly 04/27/22   Elayne Snare, MD  spironolactone (ALDACTONE) 50 MG tablet TAKE 1 TABLET BY MOUTH EVERY DAY 02/04/22   Adrian Prows, MD  sucralfate (CARAFATE) 1 GM/10ML suspension Take 10 mLs by mouth 2 (two) times daily. 12/09/20   [provider]  topiramate (TOPAMAX) 25 MG tablet Take 75 mg by mouth at bedtime. 03/17/16   [provider]  torsemide (DEMADEX) 20 MG tablet Take 1 tablet (20 mg total) by mouth at bedtime. 06/09/21   Cantwell, Celeste C, PA-C  TRANSDERM-SCOP 1 MG/3DAYS 1 patch every 3 (three) days.  12/22/21   [provider]  trimethobenzamide (TIGAN) 300 MG capsule Take 1 capsule (300 mg total) by mouth 3 (three) times daily as needed for nausea/vomiting. Do not take with the Scopolamine unless nausea is not improving 05/27/21 05/27/22  Jose Persia, MD      Allergies    Metoclopramide, Codeine, Hydrocodone, Penicillins, and Percocet [oxycodone-acetaminophen]    Review of Systems   Review of Systems  Gastrointestinal:  Positive for abdominal pain and vomiting.  All other systems reviewed and are negative.   Physical Exam Updated Vital Signs BP (!) 144/69   Pulse 78   Temp  99.1 F (37.3 C)   Resp 13   LMP 03/24/2017   SpO2 97%  Physical Exam Vitals and nursing note reviewed.  Constitutional:      Comments: Obese, vomiting   HENT:     Head: Normocephalic.     Mouth/Throat:     Pharynx: Oropharynx is clear.  Eyes:     Extraocular Movements: Extraocular movements intact.     Pupils: Pupils are equal, round, and reactive to light.  Cardiovascular:     Rate and Rhythm: Normal rate and regular rhythm.  Pulmonary:     Effort: Pulmonary effort is normal.     Breath sounds: Normal breath sounds.  Abdominal:     Comments: Mild epigastric tenderness  Skin:    General: Skin is warm.     Capillary Refill: Capillary refill takes less than 2 seconds.  Neurological:     General: No focal deficit present.  Psychiatric:        Mood and Affect: Mood normal.        Behavior: Behavior normal.     ED Results / Procedures / Treatments   Labs (all labs ordered are listed, but only abnormal results are displayed) Labs Reviewed  CBC - Abnormal; Notable for the following components:      Result Value   RDW 15.9 (*)    All other components within normal limits  COMPREHENSIVE METABOLIC PANEL - Abnormal; Notable for the following components:   Chloride 113 (*)    CO2 17 (*)    Glucose, Bld 208 (*)    Creatinine, Ser 1.13 (*)    GFR, Estimated 59 (*)    All other  components within normal limits  LIPASE, BLOOD  I-STAT BETA HCG BLOOD, ED ([facility], [facility], [facility] ONLY)  TROPONIN I (HIGH SENSITIVITY)  TROPONIN I (HIGH SENSITIVITY)    EKG EKG Interpretation  Date/Time:  Monday May 09 2022 18:59:24 EDT Ventricular Rate:  95 PR Interval:  181 QRS Duration: 131 QT Interval:  409 QTC Calculation: 515 R Axis:   28 Text Interpretation: Sinus rhythm Nonspecific intraventricular conduction delay Nonspecific T abnormalities, diffuse leads prolonged QT unchanged Confirmed by Wandra Arthurs [phone]) on 05/09/2022 7:07:48 PM  Radiology DG Chest 2 View  Result Date: 05/09/2022 CLINICAL DATA:  Chest pain. Midline chest pain and shortness of breath. EXAM: CHEST - 2 VIEW COMPARISON:  02/25/2022 FINDINGS: Mild cardiomegaly is similar to prior. Stable mediastinal contours. No pulmonary edema, focal airspace disease, pleural effusion or pneumothorax. On limited assessment, no acute osseous findings. IMPRESSION: Stable mild cardiomegaly.  No acute pulmonary process. Electronically Signed   By: Keith Rake M.D.   On: 05/09/2022 15:01    Procedures Procedures    Medications Ordered in ED Medications  promethazine (PHENERGAN) 25 mg in sodium chloride 0.9 % 50 mL IVPB (has no administration in time range)  prochlorperazine (COMPAZINE) injection 10 mg (10 mg Intravenous Given 05/09/22 1434)  diphenhydrAMINE (BENADRYL) injection 12.5 mg (12.5 mg Intravenous Given 05/09/22 1434)  sodium chloride 0.9 % bolus 1,000 mL (1,000 mLs Intravenous New Bag/Given 05/09/22 1905)  HYDROmorphone (DILAUDID) injection 1 mg (1 mg Intravenous Given 05/09/22 1927)    ED Course/ Medical Decision Making/ A&P                           Medical Decision Making CORDELL COKE is a 51 y.o. female here presenting with vomiting and abdominal pain and chest pressure.  I think vomiting  and abdominal pain likely secondary to Ozempic.  Also consider small bowel obstruction as well.  Plan to get CBC and CMP and  lipase and CT abdomen pelvis.  Patient does have baseline prolonged QTc so will need to monitor closely since most of the nausea medicines will increase her QTc.  11:08 PM I reviewed patient's labs and independently interpreted imaging studies.  Patient's labs showed bicarb of 17.  Patient's anion gap is normal.  Unfortunately patient is persistently vomiting so patient received Zofran and Phenergan and Compazine and now QTc is 560.  At this point, patient will be admitted for observation by the hospitalist.  Problems Addressed: Nausea and vomiting, unspecified vomiting type: acute illness or injury Prolonged QT interval: acute illness or injury  Amount and/or Complexity of Data Reviewed Labs: ordered. Decision-making details documented in ED Course. Radiology: ordered and independent interpretation performed. Decision-making details documented in ED Course. ECG/medicine tests: ordered.  Risk Prescription drug management. Decision regarding hospitalization.    Final Clinical Impression(s) / ED Diagnoses Final diagnoses:  None    Rx / DC Orders ED Discharge Orders     None         Drenda Freeze, MD 05/09/22 2310

## 2022-05-09 NOTE — H&P (Incomplete)
History and Physical  Patient Name: [name]     QVZ:[id number]    DOB: [birth year]    DOA: 05/09/2022 PCP: Bartholome Bill, MD  Chief Complaint: n/v  HPI: [name] is a 51 y.o. with a history of morbid obesity, CAD, hypertension, hyperlipidemia, Sjogren's, lupus presented emergency department due to intractable nausea and vomiting.  Patient saw her endocrinologist on 9/27.  At that time her Ozempic was increased to 66m from 0 mg.  Since dose increased patient developed poor p.o. intake nausea and vomiting.  Due to intractable nausea vomitings presented to the ER.  On arrival she was afebrile hemodynamically stable.  Labs were obtained which are notable for WBC 9.0, hemoglobin 12, troponin 7, bicarb 17, creatinine 1.1 chest x-ray was obtained which showed no acute process.  CT abdomen pelvis showed a small hiatal hernia otherwise chronic findings.  Patient was given Benadryl, Pepcid, fentanyl, Dilaudid, Compazine, Phenergan.  EKG was obtained patient was admitted due to concerns for recurrent prolonged QT and poor p.o. intake.  On admission she was endorsing food aversion.  She denied any infectious complaints and states her symptoms are directly correlated to increase in Ozempic.  ROS: Negative unless noted   Past Medical History:  Diagnosis Date   Acute renal failure (HCC)     Intractable nausea vomiting secondary to diabetic gastroparesis causing dehydration and acute renal failure /notes 04/01/2013   Anginal pain ([place]    Anxiety    Bell's palsy 08/09/2010   Bicornuate uterus    CAP (community acquired pneumonia) 10/2011   /Archie Endo4/04/2012   Chest pain 01/13/2014   CHF (congestive heart failure) ([place]    Diabetic gastroparesis associated with type 2 diabetes mellitus ([place]    this is presumed diagnoses, not confirmed by any studies.    Eczema    Family history of breast cancer    Family history of malignant neoplasm of breast    Family history of malignant neoplasm of ovary     Family history of ovarian cancer    Family history of prostate cancer    GERD (gastroesophageal reflux disease)    High cholesterol    Hypertension    Liver lesion 08/13/2019   Liver mass 2014   [facility]  is to undergo radiologic ablation of the mass in sept/October 2014.    Lupus ([place]    Migraines    "maybe a couple times/yr" (04/01/2013)   Obesity    Obstructive sleep apnea on CPAP 2011   Oxygen at nights   SJOGREN'S SYNDROME 08/09/2010   SLE (systemic lupus erythematosus) ([place]    /[facility] Endo5/09/2010    Past Surgical History:  Procedure Laterality Date   BREAST CYST EXCISION Left 08/2005   epidermoid   CARDIAC CATHETERIZATION  02/07/2012   ECTOPIC PREGNANCY SURGERY  1999   ESOPHAGOGASTRODUODENOSCOPY N/A 02/26/2013   Procedure: ESOPHAGOGASTRODUODENOSCOPY (EGD);  Surgeon: JIrene Shipper MD;  [facility];  Service: Endoscopy;  Laterality: N/A;   ESOPHAGOGASTRODUODENOSCOPY N/A 03/02/2015   Procedure: ESOPHAGOGASTRODUODENOSCOPY (EGD);  Surgeon: DMilus Banister MD;  [place]  Service: Endoscopy;  Laterality: N/A;   ESOPHAGOGASTRODUODENOSCOPY N/A 02/23/2021   Procedure: ESOPHAGOGASTRODUODENOSCOPY (EGD);  Surgeon: SWilford Corner MD;  [place]  Service: Endoscopy;  Laterality: N/A;  possible dilation   HERNIA REPAIR     LEFT AND RIGHT HEART CATHETERIZATION WITH CORONARY ANGIOGRAM N/A 02/07/2012   Procedure: LEFT AND RIGHT HEART CATHETERIZATION WITH CORONARY ANGIOGRAM;  Surgeon: Laverda Page, MD;  Location: [facility] CATH LAB;  Service: Cardiovascular;  Laterality: N/A;   LIVER BIOPSY  03/2013   liver mass/medical hx noted above   MUSCLE BIOPSY     for lupus/notes 09/04/2681   UMBILICAL HERNIA REPAIR  1980's    Social History:   Allergies  Allergen Reactions   Metoclopramide Other (See Comments)    Developed restless leg, akathisia type limb movements.    Codeine Itching, Rash and Other (See  Comments)   Hydrocodone Rash   Penicillins Itching    Tolerated Unasyn 02/01/2020   Percocet [Oxycodone-Acetaminophen] Hives and Rash    Tolerates dilaudid Tolerates acetaminophen     Family history: family history includes Arthritis in an other family member; Asthma in her brother, brother, and mother; Breast cancer in her cousin, maternal aunt, and maternal aunt; Breast cancer (age of onset: 76) in her maternal aunt; Cancer in her maternal aunt and maternal aunt; Diabetes in her mother; Hypertension in her mother and another family member; Ovarian cancer in her maternal aunt; Ovarian cancer (age of onset: 70) in her sister; Prostate cancer in her maternal uncle; Stomach cancer in her maternal uncle; Stroke in her mother and another family member; Urticaria in her mother.  Prior to Admission medications   Medication Sig Start Date End Date Taking? Authorizing Provider  ACCU-CHEK AVIVA PLUS test strip USE TO TEST BLOOD SUGAR 4 TIMES DAILY 05/05/21   Renato Shin, MD  ADVAIR [facility] 230-21 MCG/ACT inhaler Inhale 1-2 puffs into the lungs 2 (two) times daily. 12/20/20   [provider]  albuterol (PROVENTIL) (2.5 MG/3ML) 0.083% nebulizer solution Take 2.5 mg by nebulization every 6 (six) hours as needed for wheezing.    [provider]  ALPRAZolam Duanne Moron) 0.5 MG tablet Take 0.5 mg 2 (two) times daily as needed by mouth for anxiety or sleep.  04/19/17   [provider]  amLODipine (NORVASC) 10 MG tablet TAKE 1 TABLET BY MOUTH EVERY DAY 04/18/22   Adrian Prows, MD  apixaban (ELIQUIS) 5 MG TABS tablet Take 1 tablet (5 mg total) by mouth 2 (two) times daily. 05/27/21   Jose Persia, MD  ARIPiprazole (ABILIFY) 5 MG tablet Take 5 mg by mouth daily. 12/08/20   [provider]  Azathioprine 75 MG TABS Take 150 mg by mouth 2 (two) times daily. 02/04/21   [provider]  B-D UF III MINI PEN NEEDLES 31G X 5 MM MISC USE AS DIRECTED TWICE A DAY 10/31/18   Renato Shin, MD   Belimumab (BENLYSTA) 200 MG/ML SOAJ Inject 200 mg into the skin once a week. Thursdays 12/19/19   [provider]  Blood Glucose Monitoring Suppl (ONE TOUCH ULTRA 2) w/Device KIT Check blood sugar daily 03/30/22   Elayne Snare, MD  buPROPion (WELLBUTRIN XL) 300 MG 24 hr tablet Take 300 mg by mouth daily. 03/14/22   [provider]  carvedilol (COREG) 25 MG tablet Take 1.5 tablets (37.5 mg total) by mouth 2 (two) times daily with a meal. 01/01/21   Adrian Prows, MD  cromolyn (OPTICROM) 4 % ophthalmic solution Place 1 drop into both eyes 2 (two) times daily. 01/14/21   [provider]  cycloSPORINE (RESTASIS) 0.05 % ophthalmic emulsion Place 1 drop into both eyes in the morning, at noon, and at bedtime.     [provider]  diclofenac Sodium (VOLTAREN) 1 % GEL APPLY 2 GRAMS TO AFFECTED AREA 4 TIMES A DAY 01/11/22   Cantwell, Ivor C, PA-C  DULoxetine (CYMBALTA) 60 MG capsule Take 120 mg by mouth at bedtime. 08/31/20   [provider]  EASY TOUCH PEN NEEDLES 32G X 4 MM MISC USE TO INJECT INSULIN TWICE DAILY 08/07/19   Renato Shin, MD  empagliflozin (JARDIANCE) 10 MG TABS tablet Take 1 tablet (10 mg total) by mouth daily with breakfast. 02/24/22   Elayne Snare, MD  ENTRESTO 97-103 MG TAKE 1 TABLET BY MOUTH TWICE A DAY 04/18/22   Adrian Prows, MD  famotidine (PEPCID) 20 MG tablet Take 20 mg by mouth 2 (two) times daily.    [provider]  fluticasone (FLONASE) 50 MCG/ACT nasal spray Place 2 sprays into both nostrils continuous as needed for allergies or rhinitis.    [provider]  glucose blood (ACCU-CHEK GUIDE) test strip 1 each by Other route 2 (two) times daily. And lancets 2/day 06/18/21   Renato Shin, MD  glucose blood [facility] ULTRA) test strip Test blood sugar 4 times a day 03/30/22   Elayne Snare, MD  hydroxychloroquine (PLAQUENIL) 200 MG tablet Take 200 mg by mouth 2 (two) times daily.    [provider]  insulin regular human  CONCENTRATED (HUMULIN R U-500 KWIKPEN) 500 UNIT/ML KwikPen 100 Units, 30 minutes before each meal 02/24/22   Elayne Snare, MD  Insulin Syringe-Needle U-100 (EASY TOUCH INSULIN SYRINGE) 31G X 5/16" 1 ML MISC USE AS DIRECTED THREE TIMES A DAY 10/12/18   Renato Shin, MD  isosorbide-hydrALAZINE (BIDIL) 20-37.5 MG tablet TAKE TWO (2) TABLETS BY MOUTH THREE TIMES DAILY 10/21/21   Cantwell, Celeste C, PA-C  Lancets (ONETOUCH ULTRASOFT) lancets Test blood sugar 4 times a day 03/30/22   Elayne Snare, MD  levothyroxine (SYNTHROID) 50 MCG tablet Take 1 tablet (50 mcg total) by mouth daily at 6 (six) AM. 08/30/19   Raiford Noble Latif, DO  megestrol (MEGACE) 40 MG tablet Take 40 mg by mouth daily. 01/30/15   [provider]  metFORMIN (GLUCOPHAGE-XR) 500 MG 24 hr tablet Take 2 tablets (1,000 mg total) by mouth 2 (two) times daily. 02/02/21   Renato Shin, MD  Multiple Vitamin (MULTIVITAMIN WITH MINERALS) TABS tablet Take 1 tablet by mouth daily. 08/30/19   Raiford Noble Latif, DO  oxybutynin (DITROPAN XL) 15 MG 24 hr tablet Take 15 mg by mouth daily.  08/30/17   [provider]  pregabalin (LYRICA) 75 MG capsule Take 75 mg 3 (three) times daily by mouth.  01/28/15   [provider]  QUEtiapine (SEROQUEL) 300 MG tablet Take 300 mg by mouth at bedtime. 01/08/21   [provider]  rosuvastatin (CRESTOR) 20 MG tablet Take 1 tablet (20 mg total) by mouth daily. 06/09/21 09/20/21  Cantwell, Celeste C, PA-C  Semaglutide, 1 MG/DOSE, (OZEMPIC, 1 MG/DOSE,) 4 MG/3ML SOPN Inject 1 mg weekly 04/27/22   Elayne Snare, MD  spironolactone (ALDACTONE) 50 MG tablet TAKE 1 TABLET BY MOUTH EVERY DAY 02/04/22   Adrian Prows, MD  sucralfate (CARAFATE) 1 GM/10ML suspension Take 10 mLs by mouth 2 (two) times daily. 12/09/20   [provider]  topiramate (TOPAMAX) 25 MG tablet Take 75 mg by mouth at bedtime. 03/17/16   [provider]  torsemide (DEMADEX) 20 MG tablet Take 1 tablet (20 mg total) by mouth  at bedtime. 06/09/21   Cantwell, Celeste C, PA-C  TRANSDERM-SCOP 1 MG/3DAYS 1 patch every 3 (three) days. 12/22/21   [provider]  trimethobenzamide (TIGAN) 300 MG capsule Take 1 capsule (300 mg total) by mouth 3 (three) times  daily as needed for nausea/vomiting. Do not take with the Scopolamine unless nausea is not improving 05/27/21 05/27/22  Jose Persia, MD       Physical Exam: BP (!) 157/79   Pulse 83   Temp 99.9 F (37.7 C) (Oral)   Resp (!) 21   LMP 03/24/2017   SpO2 99%  General appearance: Not in acute distress Eyes: Anicteric, conjunctiva pink, lids and lashes normal. PERRL.    ENT: No nasal deformity, discharge, epistaxis.  Lymph: No cervical or supraclavicular lymphadenopathy. Skin: Warm and dry.  No jaundice.  No suspicious rashes or lesions. Cardiac: RRR, nl S1-S2, no murmurs appreciated. No edema Respiratory: Normal respiratory rate and rhythm. Abdomen: Abdomen soft, nontender.  MSK: No deformities or effusions of the large joints of the upper or lower extremities bilaterally.  Neuro: No focal neurologic deficits.  Psych: Normal mood and affect   Labs on Admission:  I have personally reviewed following labs and imaging studies: CBC: Recent Labs  Lab 05/09/22 1432 05/10/22 0008  WBC 9.0 8.8  HGB 12.0 11.4*  HCT 37.1 35.2*  MCV 91.4 91.2  PLT 313 950   Basic Metabolic Panel: Recent Labs  Lab 05/09/22 1432 05/10/22 0008  NA 143  --   K 4.0  --   CL 113*  --   CO2 17*  --   GLUCOSE 208*  --   BUN 11  --   CREATININE 1.13* 1.13*  CALCIUM 9.9  --    GFR: Estimated Creatinine Clearance: 94 mL/min (A) (by C-G formula based on SCr of 1.13 mg/dL (H)).  Liver Function Tests: Recent Labs  Lab 05/09/22 1432  AST 19  ALT 12  ALKPHOS 88  BILITOT 0.8  PROT 7.4  ALBUMIN 4.0   Recent Labs  Lab 05/09/22 1432  LIPASE 24   No results for input(s): "AMMONIA" in the last 168 hours. Coagulation Profile: No results for input(s): "INR",  "PROTIME" in the last 168 hours. Cardiac Enzymes: No results for input(s): "CKTOTAL", "CKMB", "CKMBINDEX", "TROPONINI" in the last 168 hours. BNP (last 3 results) No results for input(s): "PROBNP" in the last 8760 hours.  Radiological Exams on Admission:  CT ABDOMEN PELVIS W CONTRAST  Result Date: 05/09/2022 CLINICAL DATA:  Abdominal pain, acute, nonlocalized. Nausea and vomiting. EXAM: CT ABDOMEN AND PELVIS WITH CONTRAST TECHNIQUE: Multidetector CT imaging of the abdomen and pelvis was performed using the standard protocol following bolus administration of intravenous contrast. RADIATION DOSE REDUCTION: This exam was performed according to the departmental dose-optimization program which includes automated exposure control, adjustment of the mA and/or kV according to patient size and/or use of iterative reconstruction technique. CONTRAST:  14m OMNIPAQUE IOHEXOL 350 MG/ML SOLN COMPARISON:  CT examination dated February 18, 2021 FINDINGS: Lower chest: No acute abnormality.  Left basilar atelectasis. Hepatobiliary: No focal liver abnormality is seen. No gallstones, gallbladder wall thickening, or biliary dilatation. Pancreas: Unremarkable. No pancreatic ductal dilatation or surrounding inflammatory changes. Spleen: Normal in size without focal abnormality. Adrenals/Urinary Tract: Adrenal glands are unremarkable. Kidneys are normal, without renal calculi, focal lesion, or hydronephrosis. Bladder is unremarkable. Stomach/Bowel: Small hiatal hernia. Stomach is within normal limits. Appendix appears normal. No evidence of bowel wall thickening, distention, or inflammatory changes. Vascular/Lymphatic: No significant vascular findings are present. No enlarged abdominal or pelvic lymph nodes. Reproductive: Uterus and bilateral adnexa are unremarkable. Other: No abdominal wall hernia or abnormality. No abdominopelvic ascites. Musculoskeletal: Degenerate disc disease of the lumbar spine with associated facet joint  arthropathy prominent at  L4-L5 and L5-S1. IMPRESSION: 1. Bowel loops are normal in caliber. No evidence of appendicitis, colitis or diverticulitis. 2. Small hiatal hernia. 3. No evidence of nephrolithiasis or hydronephrosis. 4. Degenerate disc disease of the lumbar spine prominent at L4-L5 and L5-S1 with associated facet joint arthropathy. 5. No CT evidence of acute abdominal/pelvic process. Electronically Signed   By: Keane Police D.O.   On: 05/09/2022 22:57   DG Chest 2 View  Result Date: 05/09/2022 CLINICAL DATA:  Chest pain. Midline chest pain and shortness of breath. EXAM: CHEST - 2 VIEW COMPARISON:  02/25/2022 FINDINGS: Mild cardiomegaly is similar to prior. Stable mediastinal contours. No pulmonary edema, focal airspace disease, pleural effusion or pneumothorax. On limited assessment, no acute osseous findings. IMPRESSION: Stable mild cardiomegaly.  No acute pulmonary process. Electronically Signed   By: Keith Rake M.D.   On: 05/09/2022 15:01     Assessment/Plan  Ms Rabinovich is a 51 year old female with a history of type 2 diabetes, obesity presents emergency department with intractable nausea vomiting in setting of recent Ozempic dose increased.  Etiology of symptoms is likely due to medication.  Patient was admitted for observation symptomatic relief of her nausea and vomiting.  She did receive several QT prolonging medications in the ER however she is on various psychiatric medications and has had prolonged QT in the past.  We will primarily use Ativan.  Sounds like she was started on immediately high dose of Ozempic.  This likely exacerbated her symptoms.  Intractable nausea vomiting most likely secondary to Ozempic, POA, active - Patient received numerous antiemetic medications - Patient has prolonged QT in setting of psychiatric medications Plan: Avoid QT prolonging nausea medications As needed Ativan every 4 hours Advance diet as tolerated  History of heart failure with preserved  ejection fraction, not in exacerbation-continue Entresto, spironolactone, Imdur, hydralazine, carvedilol  Depression-continue bupropion, Cymbalta  History of Sjogren's/lupus-continue Plaquenil  Hypothyroidism-continue Synthroid  Hyperlipidemia-continue Crestor  DVT prophylaxis: lovenox  Code Status: full  Disposition Plan: Anticipate dc in morning Consults called: na Admission status: obs   At the point of initial evaluation, it is my clinical opinion that admission for OBSERVATIONis reasonable and necessary because the patient's presenting complaints in the context of their chronic conditions represent sufficient risk of deterioration or significant morbidity to constitute reasonable grounds for close observation in the hospital setting, but that the patient may be medically stable for discharge from the hospital within 24 to 48 hours.    Medical decision making: Patient seen at 1:34 AM on 05/10/2022. What exists of the patient's chart was reviewed in depth and summarized above.    Emilee Hero [facility] Please page though [place] or Epic secure chat:  For password, contact charge nurse

## 2022-05-10 ENCOUNTER — Encounter (HOSPITAL_COMMUNITY): Payer: Self-pay | Admitting: Internal Medicine

## 2022-05-10 DIAGNOSIS — G4733 Obstructive sleep apnea (adult) (pediatric): Secondary | ICD-10-CM | POA: Diagnosis present

## 2022-05-10 DIAGNOSIS — K449 Diaphragmatic hernia without obstruction or gangrene: Secondary | ICD-10-CM | POA: Diagnosis present

## 2022-05-10 DIAGNOSIS — I110 Hypertensive heart disease with heart failure: Secondary | ICD-10-CM | POA: Diagnosis present

## 2022-05-10 DIAGNOSIS — F32A Depression, unspecified: Secondary | ICD-10-CM | POA: Diagnosis present

## 2022-05-10 DIAGNOSIS — Z8249 Family history of ischemic heart disease and other diseases of the circulatory system: Secondary | ICD-10-CM | POA: Diagnosis not present

## 2022-05-10 DIAGNOSIS — Z833 Family history of diabetes mellitus: Secondary | ICD-10-CM | POA: Diagnosis not present

## 2022-05-10 DIAGNOSIS — M3500 Sicca syndrome, unspecified: Secondary | ICD-10-CM | POA: Diagnosis present

## 2022-05-10 DIAGNOSIS — K219 Gastro-esophageal reflux disease without esophagitis: Secondary | ICD-10-CM | POA: Diagnosis present

## 2022-05-10 DIAGNOSIS — I5022 Chronic systolic (congestive) heart failure: Secondary | ICD-10-CM | POA: Diagnosis present

## 2022-05-10 DIAGNOSIS — R112 Nausea with vomiting, unspecified: Secondary | ICD-10-CM | POA: Diagnosis present

## 2022-05-10 DIAGNOSIS — Z6841 Body Mass Index (BMI) 40.0 and over, adult: Secondary | ICD-10-CM | POA: Diagnosis not present

## 2022-05-10 DIAGNOSIS — E7800 Pure hypercholesterolemia, unspecified: Secondary | ICD-10-CM | POA: Diagnosis present

## 2022-05-10 DIAGNOSIS — Z86711 Personal history of pulmonary embolism: Secondary | ICD-10-CM | POA: Diagnosis not present

## 2022-05-10 DIAGNOSIS — E039 Hypothyroidism, unspecified: Secondary | ICD-10-CM | POA: Diagnosis present

## 2022-05-10 DIAGNOSIS — I2510 Atherosclerotic heart disease of native coronary artery without angina pectoris: Secondary | ICD-10-CM | POA: Diagnosis present

## 2022-05-10 DIAGNOSIS — E876 Hypokalemia: Secondary | ICD-10-CM | POA: Diagnosis present

## 2022-05-10 DIAGNOSIS — Z794 Long term (current) use of insulin: Secondary | ICD-10-CM | POA: Diagnosis not present

## 2022-05-10 DIAGNOSIS — Z79899 Other long term (current) drug therapy: Secondary | ICD-10-CM | POA: Diagnosis not present

## 2022-05-10 DIAGNOSIS — K3184 Gastroparesis: Secondary | ICD-10-CM | POA: Diagnosis present

## 2022-05-10 DIAGNOSIS — R9431 Abnormal electrocardiogram [ECG] [EKG]: Secondary | ICD-10-CM | POA: Diagnosis not present

## 2022-05-10 DIAGNOSIS — Z7984 Long term (current) use of oral hypoglycemic drugs: Secondary | ICD-10-CM | POA: Diagnosis not present

## 2022-05-10 DIAGNOSIS — F419 Anxiety disorder, unspecified: Secondary | ICD-10-CM | POA: Diagnosis present

## 2022-05-10 DIAGNOSIS — M329 Systemic lupus erythematosus, unspecified: Secondary | ICD-10-CM | POA: Diagnosis present

## 2022-05-10 DIAGNOSIS — E1143 Type 2 diabetes mellitus with diabetic autonomic (poly)neuropathy: Secondary | ICD-10-CM | POA: Diagnosis present

## 2022-05-10 LAB — GLUCOSE, CAPILLARY
Glucose-Capillary: 154 mg/dL — ABNORMAL HIGH (ref 70–99)
Glucose-Capillary: 173 mg/dL — ABNORMAL HIGH (ref 70–99)

## 2022-05-10 LAB — CBC
HCT: 35.2 % — ABNORMAL LOW (ref 36.0–46.0)
Hemoglobin: 11.4 g/dL — ABNORMAL LOW (ref 12.0–15.0)
MCH: 29.5 pg (ref 26.0–34.0)
MCHC: 32.4 g/dL (ref 30.0–36.0)
MCV: 91.2 fL (ref 80.0–100.0)
Platelets: 297 10*3/uL (ref 150–400)
RBC: 3.86 MIL/uL — ABNORMAL LOW (ref 3.87–5.11)
RDW: 15.9 % — ABNORMAL HIGH (ref 11.5–15.5)
WBC: 8.8 10*3/uL (ref 4.0–10.5)
nRBC: 0 % (ref 0.0–0.2)

## 2022-05-10 LAB — CBG MONITORING, ED
Glucose-Capillary: 155 mg/dL — ABNORMAL HIGH (ref 70–99)
Glucose-Capillary: 167 mg/dL — ABNORMAL HIGH (ref 70–99)
Glucose-Capillary: 167 mg/dL — ABNORMAL HIGH (ref 70–99)

## 2022-05-10 LAB — CREATININE, SERUM
Creatinine, Ser: 1.13 mg/dL — ABNORMAL HIGH (ref 0.44–1.00)
GFR, Estimated: 59 mL/min — ABNORMAL LOW (ref >60)

## 2022-05-10 LAB — HIV ANTIBODY (ROUTINE TESTING W REFLEX): HIV Screen 4th Generation wRfx: NONREACTIVE

## 2022-05-10 MED ORDER — ISOSORB DINITRATE-HYDRALAZINE 20-37.5 MG PO TABS
2.0000 | ORAL_TABLET | Freq: Three times a day (TID) | ORAL | Status: DC
  Administered 2022-05-10 – 2022-05-14 (×13): 2 via ORAL
  Filled 2022-05-10 (×15): qty 2

## 2022-05-10 MED ORDER — CARVEDILOL 25 MG PO TABS
25.0000 mg | ORAL_TABLET | Freq: Two times a day (BID) | ORAL | Status: DC
  Administered 2022-05-10 – 2022-05-14 (×8): 25 mg via ORAL
  Filled 2022-05-10: qty 2
  Filled 2022-05-10 (×5): qty 1
  Filled 2022-05-10: qty 2
  Filled 2022-05-10 (×2): qty 1

## 2022-05-10 MED ORDER — HYDROXYCHLOROQUINE SULFATE 200 MG PO TABS
200.0000 mg | ORAL_TABLET | Freq: Two times a day (BID) | ORAL | Status: DC
  Administered 2022-05-10 – 2022-05-14 (×8): 200 mg via ORAL
  Filled 2022-05-10 (×10): qty 1

## 2022-05-10 MED ORDER — LORAZEPAM 2 MG/ML IJ SOLN
1.0000 mg | Freq: Once | INTRAMUSCULAR | Status: AC
  Administered 2022-05-10: 1 mg via INTRAVENOUS
  Filled 2022-05-10: qty 1

## 2022-05-10 MED ORDER — INSULIN ASPART 100 UNIT/ML IJ SOLN: 0.0000 [IU] | Freq: Three times a day (TID) | INTRAMUSCULAR | Status: DC

## 2022-05-10 MED ORDER — SPIRONOLACTONE 25 MG PO TABS
50.0000 mg | ORAL_TABLET | Freq: Every day | ORAL | Status: DC
  Administered 2022-05-10 – 2022-05-14 (×5): 50 mg via ORAL
  Filled 2022-05-10 (×5): qty 2

## 2022-05-10 MED ORDER — INSULIN DETEMIR 100 UNIT/ML ~~LOC~~ SOLN: 30.0000 [IU] | Freq: Two times a day (BID) | SUBCUTANEOUS | Status: DC

## 2022-05-10 MED ORDER — INSULIN ASPART 100 UNIT/ML IJ SOLN
12.0000 [IU] | Freq: Three times a day (TID) | INTRAMUSCULAR | Status: DC
  Administered 2022-05-13 – 2022-05-14 (×2): 12 [IU] via SUBCUTANEOUS

## 2022-05-10 MED ORDER — APIXABAN 5 MG PO TABS
5.0000 mg | ORAL_TABLET | Freq: Two times a day (BID) | ORAL | Status: DC
  Administered 2022-05-10 – 2022-05-14 (×10): 5 mg via ORAL
  Filled 2022-05-10 (×10): qty 1

## 2022-05-10 MED ORDER — INSULIN DETEMIR 100 UNIT/ML ~~LOC~~ SOLN
30.0000 [IU] | Freq: Two times a day (BID) | SUBCUTANEOUS | Status: DC
  Administered 2022-05-10 – 2022-05-14 (×9): 30 [IU] via SUBCUTANEOUS
  Filled 2022-05-10 (×12): qty 0.3

## 2022-05-10 MED ORDER — AMLODIPINE BESYLATE 5 MG PO TABS: 10.0000 mg | ORAL_TABLET | Freq: Every day | ORAL | Status: DC

## 2022-05-10 MED ORDER — DULOXETINE HCL 30 MG PO CPEP: 120.0000 mg | ORAL_CAPSULE | Freq: Every day | ORAL | Status: DC

## 2022-05-10 MED ORDER — ROSUVASTATIN CALCIUM 20 MG PO TABS
20.0000 mg | ORAL_TABLET | Freq: Every day | ORAL | Status: DC
  Administered 2022-05-10 – 2022-05-14 (×5): 20 mg via ORAL
  Filled 2022-05-10 (×5): qty 1

## 2022-05-10 MED ORDER — BUPROPION HCL ER (XL) 150 MG PO TB24
300.0000 mg | ORAL_TABLET | Freq: Every day | ORAL | Status: DC
  Administered 2022-05-11 – 2022-05-14 (×4): 300 mg via ORAL
  Filled 2022-05-10 (×4): qty 2
  Filled 2022-05-10: qty 1

## 2022-05-10 MED ORDER — INSULIN ASPART 100 UNIT/ML IJ SOLN: 12.0000 [IU] | Freq: Three times a day (TID) | INTRAMUSCULAR | Status: DC

## 2022-05-10 MED ORDER — CALCIUM CARBONATE ANTACID 500 MG PO CHEW
800.0000 mg | CHEWABLE_TABLET | Freq: Once | ORAL | Status: DC
  Filled 2022-05-10: qty 4

## 2022-05-10 MED ORDER — SACUBITRIL-VALSARTAN 97-103 MG PO TABS
1.0000 | ORAL_TABLET | Freq: Two times a day (BID) | ORAL | Status: DC
  Administered 2022-05-10 – 2022-05-14 (×8): 1 via ORAL
  Filled 2022-05-10 (×9): qty 1

## 2022-05-10 MED ORDER — OXYBUTYNIN CHLORIDE ER 5 MG PO TB24
5.0000 mg | ORAL_TABLET | Freq: Every day | ORAL | Status: DC
  Administered 2022-05-10 – 2022-05-14 (×5): 5 mg via ORAL
  Filled 2022-05-10 (×6): qty 1

## 2022-05-10 MED ORDER — LEVOTHYROXINE SODIUM 50 MCG PO TABS
50.0000 ug | ORAL_TABLET | Freq: Every day | ORAL | Status: DC
  Administered 2022-05-11 – 2022-05-14 (×4): 50 ug via ORAL
  Filled 2022-05-10 (×2): qty 1
  Filled 2022-05-10: qty 2
  Filled 2022-05-10 (×2): qty 1

## 2022-05-10 MED ORDER — SODIUM CHLORIDE 0.9 % IV SOLN: INTRAVENOUS | Status: DC

## 2022-05-10 MED ORDER — INSULIN ASPART 100 UNIT/ML IJ SOLN: 0.0000 [IU] | Freq: Every day | INTRAMUSCULAR | Status: DC

## 2022-05-10 MED ORDER — HYDROMORPHONE HCL 1 MG/ML IJ SOLN
1.0000 mg | INTRAMUSCULAR | Status: DC | PRN
  Administered 2022-05-10 – 2022-05-14 (×17): 1 mg via INTRAVENOUS
  Filled 2022-05-10 (×17): qty 1

## 2022-05-10 MED ORDER — CARVEDILOL 12.5 MG PO TABS: 37.5000 mg | ORAL_TABLET | Freq: Two times a day (BID) | ORAL | Status: DC

## 2022-05-10 MED ORDER — PROCHLORPERAZINE EDISYLATE 10 MG/2ML IJ SOLN
10.0000 mg | Freq: Four times a day (QID) | INTRAMUSCULAR | Status: DC | PRN
  Administered 2022-05-10 – 2022-05-14 (×10): 10 mg via INTRAVENOUS
  Filled 2022-05-10 (×11): qty 2

## 2022-05-10 MED ORDER — INSULIN ASPART 100 UNIT/ML IJ SOLN
0.0000 [IU] | Freq: Three times a day (TID) | INTRAMUSCULAR | Status: DC
  Administered 2022-05-10 – 2022-05-11 (×4): 4 [IU] via SUBCUTANEOUS
  Administered 2022-05-11 – 2022-05-12 (×3): 3 [IU] via SUBCUTANEOUS
  Administered 2022-05-13: 4 [IU] via SUBCUTANEOUS
  Administered 2022-05-13 – 2022-05-14 (×3): 3 [IU] via SUBCUTANEOUS

## 2022-05-10 NOTE — ED Notes (Signed)
ED TO INPATIENT HANDOFF REPORT  ED Nurse Name and Phone #: Finnegan Gatta  S Name/Age/Gender [name] 51 y.o. female Room/Bed: 043C/043C  Code Status   Code Status: Full Code  Home/SNF/Other Home Patient oriented to: self, place, time, and situation Is this baseline? Yes   Triage Complete: Triage complete  Chief Complaint Nausea & vomiting [R11.2]  Triage Note Presents to ed via [facility] states she has had 24 hours of n/v /co chest pain onset 6 am generalized tenderness across chest . Patient was given zofran 4 mg IV and asa324 mg per ems  Patient here with complaint of chest pain and vomiting that started yesterday.   Allergies Allergies  Allergen Reactions   Metoclopramide Other (See Comments)    Developed restless leg, akathisia type limb movements.    Codeine Itching, Rash and Other (See Comments)   Hydrocodone Rash   Penicillins Itching    Tolerated Unasyn 02/01/2020   Percocet [Oxycodone-Acetaminophen] Hives and Rash    Tolerates dilaudid Tolerates acetaminophen     Level of Care/Admitting Diagnosis ED Disposition     ED Disposition  Admit   Condition  --   Comment  Hospital Area: [facility] [[id number]]  Level of Care: Med-Surg [16]  May admit patient to [facility] or [facility] if equivalent level of care is available:: No  Covid Evaluation: Asymptomatic - no recent exposure (last 10 days) testing not required  Diagnosis: Nausea & vomiting [235573]  Admitting Physician: Dessa Phi [[id number]]  Attending Physician: Dessa Phi [[id number]]  Certification:: I certify this patient will need inpatient services for at least 2 midnights  Estimated Length of Stay: 2          B Medical/Surgery History Past Medical History:  Diagnosis Date   Acute renal failure ([place])     Intractable nausea vomiting secondary to diabetic gastroparesis causing dehydration and acute renal failure /notes 04/01/2013   Anginal pain ([place])    Anxiety    Bell's  palsy 08/09/2010   Bicornuate uterus    CAP (community acquired pneumonia) 10/2011   Archie Endo 11/08/2011   Chest pain 01/13/2014   CHF (congestive heart failure) ([place])    Diabetic gastroparesis associated with type 2 diabetes mellitus ([place])    this is presumed diagnoses, not confirmed by any studies.    Eczema    Family history of breast cancer    Family history of malignant neoplasm of breast    Family history of malignant neoplasm of ovary    Family history of ovarian cancer    Family history of prostate cancer    GERD (gastroesophageal reflux disease)    High cholesterol    Hypertension    Liver lesion 08/13/2019   Liver mass 2014   biopsied 03/2013 at [facility], not malignant.  is to undergo radiologic ablation of the mass in sept/October 2014.    Lupus ([place])    Migraines    "maybe a couple times/yr" (04/01/2013)   Obesity    Obstructive sleep apnea on CPAP 2011   Oxygen at nights   SJOGREN'S SYNDROME 08/09/2010   SLE (systemic lupus erythematosus) ([place])    Archie Endo 12/01/2010   Past Surgical History:  Procedure Laterality Date   BREAST CYST EXCISION Left 08/2005   epidermoid   CARDIAC CATHETERIZATION  02/07/2012   ECTOPIC PREGNANCY SURGERY  1999   ESOPHAGOGASTRODUODENOSCOPY N/A 02/26/2013   Procedure: ESOPHAGOGASTRODUODENOSCOPY (EGD);  Surgeon: Irene Shipper, MD;  Location[facility] ENDOSCOPY;  Service: Endoscopy;  Laterality:  N/A;   ESOPHAGOGASTRODUODENOSCOPY N/A 03/02/2015   Procedure: ESOPHAGOGASTRODUODENOSCOPY (EGD);  Surgeon: Milus Banister, MD;  Location: [place];  Service: Endoscopy;  Laterality: N/A;   ESOPHAGOGASTRODUODENOSCOPY N/A 02/23/2021   Procedure: ESOPHAGOGASTRODUODENOSCOPY (EGD);  Surgeon: Wilford Corner, MD;  Location: [place];  Service: Endoscopy;  Laterality: N/A;  possible dilation   HERNIA REPAIR     LEFT AND RIGHT HEART CATHETERIZATION WITH CORONARY ANGIOGRAM N/A 02/07/2012   Procedure: LEFT AND RIGHT HEART CATHETERIZATION WITH CORONARY  ANGIOGRAM;  Surgeon: Laverda Page, MD;  Location: [facility] CATH LAB;  Service: Cardiovascular;  Laterality: N/A;   LIVER BIOPSY  03/2013   liver mass/medical hx noted above   MUSCLE BIOPSY     for lupus/notes 09/09/9369   UMBILICAL HERNIA REPAIR  1980's     A IV Location/Drains/Wounds Patient Lines/Drains/Airways Status     Active Line/Drains/Airways     Name Placement date Placement time Site Days   Peripheral IV 05/09/22 20 G Left Antecubital 05/09/22  1257  Antecubital  1   External Urinary Catheter 05/10/22  0000  --  less than 1            Intake/Output Last 24 hours No intake or output data in the 24 hours ending 05/10/22 1644  Labs/Imaging Results for orders placed or performed during the hospital encounter of 05/09/22 (from the past 48 hour(s))  CBC     Status: Abnormal   Collection Time: 05/09/22  2:32 PM  Result Value Ref Range   WBC 9.0 4.0 - 10.5 K/uL   RBC 4.06 3.87 - 5.11 MIL/uL   Hemoglobin 12.0 12.0 - 15.0 g/dL   HCT 37.1 36.0 - 46.0 %   MCV 91.4 80.0 - 100.0 fL   MCH 29.6 26.0 - 34.0 pg   MCHC 32.3 30.0 - 36.0 g/dL   RDW 15.9 (H) 11.5 - 15.5 %   Platelets 313 150 - 400 K/uL   nRBC 0.0 0.0 - 0.2 %    Comment: Performed at [facility], [place] [address] [place], [place] [address]  Troponin I (High Sensitivity)     Status: None   Collection Time: 05/09/22  2:32 PM  Result Value Ref Range   Troponin I (High Sensitivity) 7 <18 ng/L    Comment: (NOTE) Elevated high sensitivity troponin I (hsTnI) values and significant  changes across serial measurements may suggest ACS but many other  chronic and acute conditions are known to elevate hsTnI results.  Refer to the "Links" section for chest pain algorithms and additional  guidance. Performed at [facility], [place] [address] [place], [place] [address]   Comprehensive metabolic panel     Status: Abnormal   Collection Time: 05/09/22  2:32 PM  Result Value Ref Range   Sodium 143 135 - 145 mmol/L    Potassium 4.0 3.5 - 5.1 mmol/L   Chloride 113 (H) 98 - 111 mmol/L   CO2 17 (L) 22 - 32 mmol/L   Glucose, Bld 208 (H) 70 - 99 mg/dL    Comment: Glucose reference range applies only to samples taken after fasting for at least 8 hours.   BUN 11 6 - 20 mg/dL   Creatinine, Ser 1.13 (H) 0.44 - 1.00 mg/dL   Calcium 9.9 8.9 - 10.3 mg/dL   Total Protein 7.4 6.5 - 8.1 g/dL   Albumin 4.0 3.5 - 5.0 g/dL   AST 19 15 - 41 U/L   ALT 12 0 - 44 U/L  Alkaline Phosphatase 88 38 - 126 U/L   Total Bilirubin 0.8 0.3 - 1.2 mg/dL   GFR, Estimated 59 (L) >60 mL/min    Comment: (NOTE) Calculated using the CKD-EPI Creatinine Equation (2021)    Anion gap 13 5 - 15    Comment: Performed at [place] [address] [place], [place] [address]  Lipase, blood     Status: None   Collection Time: 05/09/22  2:32 PM  Result Value Ref Range   Lipase 24 11 - 51 U/L    Comment: Performed at [facility], [place] [address], [place], [place] [address]  I-Stat beta hCG blood, ED     Status: None   Collection Time: 05/09/22  5:03 PM  Result Value Ref Range   I-stat hCG, quantitative <5.0 <5 mIU/mL   Comment 3            Comment:   GEST. AGE      CONC.  (mIU/mL)   <=1 WEEK        5 - 50     2 WEEKS       50 - 500     3 WEEKS       100 - 10,000     4 WEEKS     1,000 - 30,000        FEMALE AND NON-PREGNANT FEMALE:     LESS THAN 5 mIU/mL   Troponin I (High Sensitivity)     Status: None   Collection Time: 05/09/22  7:08 PM  Result Value Ref Range   Troponin I (High Sensitivity) 6 <18 ng/L    Comment: (NOTE) Elevated high sensitivity troponin I (hsTnI) values and significant  changes across serial measurements may suggest ACS but many other  chronic and acute conditions are known to elevate hsTnI results.  Refer to the "Links" section for chest pain algorithms and additional  guidance. Performed at [facility], [place] [address], [place], [place] [address]   HIV Antibody (routine testing w  rflx)     Status: None   Collection Time: 05/10/22 12:08 AM  Result Value Ref Range   HIV Screen 4th Generation wRfx Non Reactive Non Reactive    Comment: Performed at [facility], [place] [address], [place], [place] [address]  CBC     Status: Abnormal   Collection Time: 05/10/22 12:08 AM  Result Value Ref Range   WBC 8.8 4.0 - 10.5 K/uL   RBC 3.86 (L) 3.87 - 5.11 MIL/uL   Hemoglobin 11.4 (L) 12.0 - 15.0 g/dL   HCT 35.2 (L) 36.0 - 46.0 %   MCV 91.2 80.0 - 100.0 fL   MCH 29.5 26.0 - 34.0 pg   MCHC 32.4 30.0 - 36.0 g/dL   RDW 15.9 (H) 11.5 - 15.5 %   Platelets 297 150 - 400 K/uL   nRBC 0.0 0.0 - 0.2 %    Comment: Performed at [facility], [place] [address], [place], [place] [address]  Creatinine, serum     Status: Abnormal   Collection Time: 05/10/22 12:08 AM  Result Value Ref Range   Creatinine, Ser 1.13 (H) 0.44 - 1.00 mg/dL   GFR, Estimated 59 (L) >60 mL/min    Comment: (NOTE) Calculated using the CKD-EPI Creatinine Equation (2021) Performed at [facility] [address], [place], [place] [address]   CBG monitoring, ED     Status: Abnormal   Collection Time: 05/10/22  3:27 AM  Result Value Ref Range   Glucose-Capillary 155 (H) 70 - 99 mg/dL    Comment: Glucose reference range applies only to samples taken after fasting for at least 8 hours.  CBG monitoring, ED     Status: Abnormal   Collection Time: 05/10/22  8:22 AM  Result Value Ref Range   Glucose-Capillary 167 (H) 70 - 99 mg/dL    Comment: Glucose reference range applies only to samples taken after fasting for at least 8 hours.  CBG monitoring, ED     Status: Abnormal   Collection Time: 05/10/22 11:59 AM  Result Value Ref Range   Glucose-Capillary 167 (H) 70 - 99 mg/dL    Comment: Glucose reference range applies only to samples taken after fasting for at least 8 hours.   CT ABDOMEN PELVIS W CONTRAST  Result Date: 05/09/2022 CLINICAL DATA:  Abdominal pain, acute, nonlocalized. Nausea and vomiting.  EXAM: CT ABDOMEN AND PELVIS WITH CONTRAST TECHNIQUE: Multidetector CT imaging of the abdomen and pelvis was performed using the standard protocol following bolus administration of intravenous contrast. RADIATION DOSE REDUCTION: This exam was performed according to the departmental dose-optimization program which includes automated exposure control, adjustment of the mA and/or kV according to patient size and/or use of iterative reconstruction technique. CONTRAST:  136m OMNIPAQUE IOHEXOL 350 MG/ML SOLN COMPARISON:  CT examination dated February 18, 2021 FINDINGS: Lower chest: No acute abnormality.  Left basilar atelectasis. Hepatobiliary: No focal liver abnormality is seen. No gallstones, gallbladder wall thickening, or biliary dilatation. Pancreas: Unremarkable. No pancreatic ductal dilatation or surrounding inflammatory changes. Spleen: Normal in size without focal abnormality. Adrenals/Urinary Tract: Adrenal glands are unremarkable. Kidneys are normal, without renal calculi, focal lesion, or hydronephrosis. Bladder is unremarkable. Stomach/Bowel: Small hiatal hernia. Stomach is within normal limits. Appendix appears normal. No evidence of bowel wall thickening, distention, or inflammatory changes. Vascular/Lymphatic: No significant vascular findings are present. No enlarged abdominal or pelvic lymph nodes. Reproductive: Uterus and bilateral adnexa are unremarkable. Other: No abdominal wall hernia or abnormality. No abdominopelvic ascites. Musculoskeletal: Degenerate disc disease of the lumbar spine with associated facet joint arthropathy prominent at L4-L5 and L5-S1. IMPRESSION: 1. Bowel loops are normal in caliber. No evidence of appendicitis, colitis or diverticulitis. 2. Small hiatal hernia. 3. No evidence of nephrolithiasis or hydronephrosis. 4. Degenerate disc disease of the lumbar spine prominent at L4-L5 and L5-S1 with associated facet joint arthropathy. 5. No CT evidence of acute abdominal/pelvic process.  Electronically Signed   By: IKeane PoliceD.O.   On: 05/09/2022 22:57   DG Chest 2 View  Result Date: 05/09/2022 CLINICAL DATA:  Chest pain. Midline chest pain and shortness of breath. EXAM: CHEST - 2 VIEW COMPARISON:  02/25/2022 FINDINGS: Mild cardiomegaly is similar to prior. Stable mediastinal contours. No pulmonary edema, focal airspace disease, pleural effusion or pneumothorax. On limited assessment, no acute osseous findings. IMPRESSION: Stable mild cardiomegaly.  No acute pulmonary process. Electronically Signed   By: MKeith RakeM.D.   On: 05/09/2022 15:01    Pending Labs Unresulted Labs (From admission, onward)     Start     Ordered   05/16/22 0500  Creatinine, serum  (enoxaparin (LOVENOX)    CrCl >/= 30 ml/min)  Weekly,   R     Comments: while on enoxaparin therapy    05/09/22 2340   05/11/22 0500  Comprehensive metabolic panel  Tomorrow morning,   R        05/10/22 1226  Vitals/Pain Today's Vitals   05/10/22 1254 05/10/22 1500 05/10/22 1600 05/10/22 1614  BP:  (!) 169/87 (!) 182/97   Pulse:  88 96   Resp:  14 20   Temp: 98.2 F (36.8 C)   97.7 F (36.5 C)  TempSrc: Oral   Oral  SpO2:  99% 98%   PainSc:        Isolation Precautions No active isolations  Medications Medications  acetaminophen (TYLENOL) tablet 650 mg (has no administration in time range)    Or  acetaminophen (TYLENOL) suppository 650 mg (has no administration in time range)  LORazepam (ATIVAN) injection 0.5 mg (0.5 mg Intravenous Given 05/10/22 1638)  hydroxychloroquine (PLAQUENIL) tablet 200 mg (200 mg Oral Not Given 05/10/22 1501)  sacubitril-valsartan (ENTRESTO) 97-103 mg per tablet (1 tablet Oral Not Given 05/10/22 1501)  isosorbide-hydrALAZINE (BIDIL) 20-37.5 MG per tablet 2 tablet (2 tablets Oral Given 05/10/22 1611)  rosuvastatin (CRESTOR) tablet 20 mg (20 mg Oral Given 05/10/22 1152)  spironolactone (ALDACTONE) tablet 50 mg (50 mg Oral Given 05/10/22 1152)  buPROPion  (WELLBUTRIN XL) 24 hr tablet 300 mg (300 mg Oral Not Given 05/10/22 1502)  levothyroxine (SYNTHROID) tablet 50 mcg (50 mcg Oral Patient Refused/Not Given 05/10/22 0610)  carvedilol (COREG) tablet 25 mg (25 mg Oral Given 05/10/22 1611)  insulin detemir (LEVEMIR) injection 30 Units (30 Units Subcutaneous Not Given 05/10/22 1502)  insulin aspart (novoLOG) injection 0-20 Units (4 Units Subcutaneous Given 05/10/22 1207)  insulin aspart (novoLOG) injection 0-5 Units (has no administration in time range)  insulin aspart (novoLOG) injection 12 Units (12 Units Subcutaneous Not Given 05/10/22 1201)  calcium carbonate (TUMS - dosed in mg elemental calcium) chewable tablet 800 mg of elemental calcium (800 mg of elemental calcium Oral Patient Refused/Not Given 05/10/22 0327)  apixaban (ELIQUIS) tablet 5 mg (5 mg Oral Given 05/10/22 1152)  HYDROmorphone (DILAUDID) injection 1 mg (1 mg Intravenous Given 05/10/22 1639)  0.9 %  sodium chloride infusion ( Intravenous New Bag/Given 05/10/22 1154)  oxybutynin (DITROPAN-XL) 24 hr tablet 5 mg (5 mg Oral Given 05/10/22 1201)  prochlorperazine (COMPAZINE) injection 10 mg (has no administration in time range)  prochlorperazine (COMPAZINE) injection 10 mg (10 mg Intravenous Given 05/09/22 1434)  diphenhydrAMINE (BENADRYL) injection 12.5 mg (12.5 mg Intravenous Given 05/09/22 1434)  sodium chloride 0.9 % bolus 1,000 mL (0 mLs Intravenous Stopped 05/09/22 2135)  HYDROmorphone (DILAUDID) injection 1 mg (1 mg Intravenous Given 05/09/22 1927)  famotidine (PEPCID) IVPB 20 mg premix (0 mg Intravenous Stopped 05/09/22 2134)  pantoprazole (PROTONIX) injection 40 mg (40 mg Intravenous Given 05/09/22 2045)  iohexol (OMNIPAQUE) 350 MG/ML injection 100 mL (100 mLs Intravenous Contrast Given 05/09/22 2234)  sodium chloride 0.9 % bolus 1,000 mL (0 mLs Intravenous Stopped 05/10/22 0202)  fentaNYL (SUBLIMAZE) injection 50 mcg (50 mcg Intravenous Given 05/09/22 2359)  LORazepam (ATIVAN)  injection 1 mg (1 mg Intravenous Given 05/10/22 0423)    Mobility walks with device Low fall risk   Focused Assessments Cardiac Assessment Handoff:    Lab Results  Component Value Date   CKTOTAL 132 01/26/2015   CKMB 8.6 (HH) 11/09/2011   TROPONINI <0.03 06/05/2017   Lab Results  Component Value Date   DDIMER 1.17 (H) 02/03/2020   Does the Patient currently have chest pain? No    R Recommendations: See Admitting Provider Note  Report given to:   Additional Notes:

## 2022-05-10 NOTE — Progress Notes (Signed)
  PROGRESS NOTE  Patient admitted earlier this morning. See H&P.   Patient was admitted with symptoms of nausea, vomiting, abdominal pain.  She states that she took 1 dose of Ozempic 2 mg.  She is adamant that this was her first dose and she was given 2 mg to start.  Per Dr. Dwyane Dee (endocrinology) office note on 04/27/22, she was on 0.5 mg weekly and was increased to 1 mg weekly at that office appointment.  Patient was evaluated in the emergency department, continues to have nausea, vomiting and abdominal pain.  Start gentle IV fluid Antiemetic and pain medication Clear liquid diet   Status is: Observation The patient will require care spanning > 2 midnights and should be moved to inpatient because: cannot tolerate PO    Dessa Phi, DO [facility] 05/10/2022, 11:00 AM  Available via Epic secure chat 7am-7pm After these hours, please refer to coverage provider listed on [email or web address]

## 2022-05-10 NOTE — ED Notes (Signed)
Pt refusing PO meds, stating they will come back up. Refused breakfast due to fear it will come back up.

## 2022-05-10 NOTE — ED Notes (Signed)
Cbg 155

## 2022-05-10 NOTE — ED Notes (Signed)
Patient is tearful and c/o 10/10 abd pain with nausea. Patient provided with medicaiton per [facility]

## 2022-05-11 DIAGNOSIS — I5022 Chronic systolic (congestive) heart failure: Secondary | ICD-10-CM

## 2022-05-11 DIAGNOSIS — R9431 Abnormal electrocardiogram [ECG] [EKG]: Secondary | ICD-10-CM

## 2022-05-11 DIAGNOSIS — R112 Nausea with vomiting, unspecified: Secondary | ICD-10-CM | POA: Diagnosis not present

## 2022-05-11 LAB — GLUCOSE, CAPILLARY
Glucose-Capillary: 159 mg/dL — ABNORMAL HIGH (ref 70–99)
Glucose-Capillary: 137 mg/dL — ABNORMAL HIGH (ref 70–99)
Glucose-Capillary: 104 mg/dL — ABNORMAL HIGH (ref 70–99)
Glucose-Capillary: 115 mg/dL — ABNORMAL HIGH (ref 70–99)

## 2022-05-11 LAB — COMPREHENSIVE METABOLIC PANEL
ALT: 9 U/L (ref 0–44)
AST: 16 U/L (ref 15–41)
Albumin: 3.3 g/dL — ABNORMAL LOW (ref 3.5–5.0)
Alkaline Phosphatase: 66 U/L (ref 38–126)
Anion gap: 6 (ref 5–15)
BUN: 18 mg/dL (ref 6–20)
CO2: 21 mmol/L — ABNORMAL LOW (ref 22–32)
Calcium: 8.9 mg/dL (ref 8.9–10.3)
Chloride: 113 mmol/L — ABNORMAL HIGH (ref 98–111)
Creatinine, Ser: 1.22 mg/dL — ABNORMAL HIGH (ref 0.44–1.00)
GFR, Estimated: 54 mL/min — ABNORMAL LOW (ref >60)
Glucose, Bld: 176 mg/dL — ABNORMAL HIGH (ref 70–99)
Potassium: 3.4 mmol/L — ABNORMAL LOW (ref 3.5–5.1)
Sodium: 140 mmol/L (ref 135–145)
Total Bilirubin: 0.8 mg/dL (ref 0.3–1.2)
Total Protein: 6 g/dL — ABNORMAL LOW (ref 6.5–8.1)

## 2022-05-11 MED ORDER — PANTOPRAZOLE SODIUM 40 MG IV SOLR
40.0000 mg | Freq: Two times a day (BID) | INTRAVENOUS | Status: DC
  Administered 2022-05-11 (×2): 40 mg via INTRAVENOUS
  Filled 2022-05-11 (×2): qty 10

## 2022-05-11 MED ORDER — POTASSIUM CHLORIDE CRYS ER 20 MEQ PO TBCR
40.0000 meq | EXTENDED_RELEASE_TABLET | Freq: Once | ORAL | Status: AC
  Administered 2022-05-11: 40 meq via ORAL
  Filled 2022-05-11: qty 2

## 2022-05-11 NOTE — Progress Notes (Addendum)
Tried ambulating the pt to monitor the spo2 level on ambulation but pt complained of dizziness,lightheadness and nausea so cannot march her step forward.Her spo2 was in between 95 to 99 on roomair while standing.BP was 142/80 while sitting

## 2022-05-11 NOTE — Progress Notes (Signed)
[facility] PROGRESS NOTE   [name] NIO:[id number] DOB: [birth year] DOA: 05/09/2022  PCP: Bartholome Bill, MD  Brief History/Interval Summary: 51 y.o. with a history of morbid obesity, CAD, hypertension, hyperlipidemia, Sjogren's, lupus, presented to emergency department due to intractable nausea and vomiting.  Patient saw her endocrinologist on 9/27.  At that time her Ozempic was increased to '2mg'$ .  Since dose increased patient developed poor p.o. intake nausea and vomiting.  Due to intractable nausea vomitings presented to the ER.  CT abdomen pelvis showed a small hiatal hernia otherwise chronic findings.  Patient was given Benadryl, Pepcid, fentanyl, Dilaudid, Compazine, Phenergan.  EKG was obtained patient was admitted due to concerns for recurrent prolonged QT and poor p.o. intake.  On admission she was endorsing food aversion.  She denied any infectious complaints and states her symptoms are directly correlated to increase in Ozempic.    Consultants: None  Procedures: None    Subjective/Interval History: Patient continues to have nausea.  Continues to have upper abdominal pain.  No blood in the emesis.  No fever or chills.  She feels that all of her symptoms are due to Ozempic.    Assessment/Plan:  Intractable nausea and vomiting Possibly secondary to Ozempic.  CT abdomen pelvis did not show any acute findings.  LFTs were normal.  Lipase was normal. Ozempic can cause nausea vomiting.  There are reports that patient may have been taking Ozempic for a while and the dose was increased recently.  However patient mentions that this is the first time she took her Ozempic.  Her dose was on Sunday.  This medicine does have a half-life of 1 week. Continue symptomatic treatment for now.  QT prolongation Patient takes multiple psychotropic medications including Wellbutrin Seroquel.  Due to QT prolongation Seroquel is currently on hold.  Noted to be on Compazine for nausea.   Recheck EKG tomorrow.  Chronic systolic CHF Noted to be on goal-directed medical treatment with Entresto, spironolactone.  Also noted to be on Imdur and hydralazine.  On carvedilol.  Cardiac status appears to be stable.  History of PE Continue apixaban  History of depression Continue bupropion and Cymbalta.  History of Sjogren's and lupus Continue Plaquenil  Hypothyroidism Continue levothyroxine.  Hyperlipidemia Continue Crestor  Class III obesity Estimated body mass index is 52.55 kg/m as calculated from the following:   Height as of 04/27/22: '5\' 8"'$  (1.727 m).   Weight as of 04/27/22: 156.8 kg.   DVT Prophylaxis: On apixaban Code Status: Full code Family Communication: Discussed with patient Disposition Plan: Start mobilizing.  Anticipate discharge home when improved  Status is: Inpatient Remains inpatient appropriate because: Intractable nausea and vomiting      Medications: Scheduled:  apixaban  5 mg Oral BID   buPROPion  300 mg Oral Daily   calcium carbonate  800 mg of elemental calcium Oral Once   carvedilol  25 mg Oral BID WC   hydroxychloroquine  200 mg Oral BID   insulin aspart  0-20 Units Subcutaneous TID WC   insulin aspart  0-5 Units Subcutaneous QHS   insulin aspart  12 Units Subcutaneous TID WC   insulin detemir  30 Units Subcutaneous BID   isosorbide-hydrALAZINE  2 tablet Oral TID   levothyroxine  50 mcg Oral Q0600   oxybutynin  5 mg Oral Daily   pantoprazole (PROTONIX) IV  40 mg Intravenous Q12H   potassium chloride  40 mEq Oral Once   rosuvastatin  20 mg Oral Daily  sacubitril-valsartan  1 tablet Oral BID   spironolactone  50 mg Oral Daily   Continuous:  sodium chloride 75 mL/hr at 05/11/22 0915   [id number]:[id number] **OR** acetaminophen, HYDROmorphone (DILAUDID) injection, LORazepam, prochlorperazine  Antibiotics: Anti-infectives (From admission, onward)    Start     Dose/Rate Route Frequency Ordered Stop   05/10/22 1000   hydroxychloroquine (PLAQUENIL) tablet 200 mg        200 mg Oral 2 times daily 05/10/22 0157         Objective:  Vital Signs  Vitals:   05/10/22 2104 05/11/22 0050 05/11/22 0500 05/11/22 0757  BP: 137/84 (!) 140/77 133/77 110/71  Pulse: 88 80 82 96  Resp:  '15 16 20  '$ Temp: 97.8 F (36.6 C) (!) 97.5 F (36.4 C) 98 F (36.7 C) 98.4 F (36.9 C)  TempSrc: Oral Oral Oral Oral  SpO2: 100% 100% 100% 99%    Intake/Output Summary (Last 24 hours) at 05/11/2022 1034 Last data filed at 05/11/2022 0300 Gross per 24 hour  Intake 1119.78 ml  Output --  Net 1119.78 ml   There were no vitals filed for this visit.  General appearance: Awake alert.  In no distress Resp: Clear to auscultation bilaterally.  Normal effort Cardio: S1-S2 is normal regular.  No S3-S4.  No rubs murmurs or bruit GI: Abdomen is soft.  Mildly tender in the epigastric area without any rebound rigidity or guarding.  No masses organomegaly. Extremities: No edema.  Full range of motion of lower extremities. Neurologic: Alert and oriented x3.  No focal neurological deficits.    Lab Results:  Data Reviewed: I have personally reviewed following labs and reports of the imaging studies  CBC: Recent Labs  Lab 05/09/22 1432 05/10/22 0008  WBC 9.0 8.8  HGB 12.0 11.4*  HCT 37.1 35.2*  MCV 91.4 91.2  PLT 313 737    Basic Metabolic Panel: Recent Labs  Lab 05/09/22 1432 05/10/22 0008 05/11/22 0051  NA 143  --  140  K 4.0  --  3.4*  CL 113*  --  113*  CO2 17*  --  21*  GLUCOSE 208*  --  176*  BUN 11  --  18  CREATININE 1.13* 1.13* 1.22*  CALCIUM 9.9  --  8.9    GFR: Estimated Creatinine Clearance: 87.1 mL/min (A) (by C-G formula based on SCr of 1.22 mg/dL (H)).  Liver Function Tests: Recent Labs  Lab 05/09/22 1432 05/11/22 0051  AST 19 16  ALT 12 9  ALKPHOS 88 66  BILITOT 0.8 0.8  PROT 7.4 6.0*  ALBUMIN 4.0 3.3*    Recent Labs  Lab 05/09/22 1432  LIPASE 24     CBG: Recent Labs  Lab  05/10/22 0822 05/10/22 1159 05/10/22 1822 05/10/22 2059 05/11/22 0750  GLUCAP 167* 167* 154* 173* 159*     Radiology Studies: CT ABDOMEN PELVIS W CONTRAST  Result Date: 05/09/2022 CLINICAL DATA:  Abdominal pain, acute, nonlocalized. Nausea and vomiting. EXAM: CT ABDOMEN AND PELVIS WITH CONTRAST TECHNIQUE: Multidetector CT imaging of the abdomen and pelvis was performed using the standard protocol following bolus administration of intravenous contrast. RADIATION DOSE REDUCTION: This exam was performed according to the departmental dose-optimization program which includes automated exposure control, adjustment of the mA and/or kV according to patient size and/or use of iterative reconstruction technique. CONTRAST:  158m OMNIPAQUE IOHEXOL 350 MG/ML SOLN COMPARISON:  CT examination dated February 18, 2021 FINDINGS: Lower chest: No acute abnormality.  Left basilar atelectasis. Hepatobiliary: No focal  liver abnormality is seen. No gallstones, gallbladder wall thickening, or biliary dilatation. Pancreas: Unremarkable. No pancreatic ductal dilatation or surrounding inflammatory changes. Spleen: Normal in size without focal abnormality. Adrenals/Urinary Tract: Adrenal glands are unremarkable. Kidneys are normal, without renal calculi, focal lesion, or hydronephrosis. Bladder is unremarkable. Stomach/Bowel: Small hiatal hernia. Stomach is within normal limits. Appendix appears normal. No evidence of bowel wall thickening, distention, or inflammatory changes. Vascular/Lymphatic: No significant vascular findings are present. No enlarged abdominal or pelvic lymph nodes. Reproductive: Uterus and bilateral adnexa are unremarkable. Other: No abdominal wall hernia or abnormality. No abdominopelvic ascites. Musculoskeletal: Degenerate disc disease of the lumbar spine with associated facet joint arthropathy prominent at L4-L5 and L5-S1. IMPRESSION: 1. Bowel loops are normal in caliber. No evidence of appendicitis, colitis or  diverticulitis. 2. Small hiatal hernia. 3. No evidence of nephrolithiasis or hydronephrosis. 4. Degenerate disc disease of the lumbar spine prominent at L4-L5 and L5-S1 with associated facet joint arthropathy. 5. No CT evidence of acute abdominal/pelvic process. Electronically Signed   By: Keane Police D.O.   On: 05/09/2022 22:57   DG Chest 2 View  Result Date: 05/09/2022 CLINICAL DATA:  Chest pain. Midline chest pain and shortness of breath. EXAM: CHEST - 2 VIEW COMPARISON:  02/25/2022 FINDINGS: Mild cardiomegaly is similar to prior. Stable mediastinal contours. No pulmonary edema, focal airspace disease, pleural effusion or pneumothorax. On limited assessment, no acute osseous findings. IMPRESSION: Stable mild cardiomegaly.  No acute pulmonary process. Electronically Signed   By: Keith Rake M.D.   On: 05/09/2022 15:01       LOS: 1 day   [place] [facility] Pager on [email or web address]  05/11/2022, 10:34 AM

## 2022-05-11 NOTE — TOC Initial Note (Addendum)
Transition of Care [facility]) - Initial/Assessment Note    Patient Details  Name: [name] MRN: [id number] Date of Birth: August 17, 1970  Transition of [facility]) CM/SW Contact:    Cyndi Bender, RN Phone Number: 05/11/2022, 1:08 PM  Clinical Narrative:                 Spoke to patient regarding transition needs. Patient states she lives alone. Patient has transportation to apts and can afford her medications.  Patient states she has a Conservation officer, nature but it doesn't work. Patient doesn't know what agency supplied the concentrator . Requested bedside RN do ambulatory sat. Patient states [facility] was suppose to come out post her 05/27/2021 admission but never did. Patient has [place] aide service with [facility] 7 days a week 2-3 hours a day.  Address, Phone number and PCP verified.   TOC will continue to follow for needs.  Expected Discharge Plan: Home/Self Care Barriers to Discharge: Continued Medical Work up   Patient Goals and CMS Choice Patient states their goals for this hospitalization and ongoing recovery are:: return home      Expected Discharge Plan and Services Expected Discharge Plan: Home/Self Care       Living arrangements for the past 2 months: Single Family Home                                      Prior Living Arrangements/Services Living arrangements for the past 2 months: Single Family Home Lives with:: Self Patient language and need for interpreter reviewed:: Yes Do you feel safe going back to the place where you live?: Yes      Need for Family Participation in Patient Care: Yes (Comment) Care giver support system in place?: Yes (comment) Current home services: DME (wheelchair) Criminal Activity/Legal Involvement Pertinent to Current Situation/Hospitalization: No - Comment as needed  Activities of Daily Living Home Assistive Devices/Equipment: Cane (specify quad or straight) ADL Screening (condition at time of admission) Patient's  cognitive ability adequate to safely complete daily activities?: Yes Is the patient deaf or have difficulty hearing?: No Does the patient have difficulty seeing, even when wearing glasses/contacts?: No Does the patient have difficulty concentrating, remembering, or making decisions?: No Patient able to express need for assistance with ADLs?: Yes Does the patient have difficulty dressing or bathing?: No Independently performs ADLs?: Yes (appropriate for developmental age) Does the patient have difficulty walking or climbing stairs?: Yes Weakness of Legs: Both Weakness of Arms/Hands: None  Permission Sought/Granted                  Emotional Assessment   Attitude/Demeanor/Rapport: Gracious Affect (typically observed): Accepting Orientation: : Oriented to Self, Oriented to  Time, Oriented to Situation, Oriented to Place Alcohol / Substance Use: Not Applicable Psych Involvement: No (comment)  Admission diagnosis:  Nausea & vomiting [R11.2] Prolonged QT interval [R94.31] Nausea and vomiting, unspecified vomiting type [R11.2] Patient Active Problem List   Diagnosis Date Noted   Nausea & vomiting 05/09/2022   Vitamin B12 deficiency 01/04/2022   History of pulmonary embolism 12/22/2021   Normocytic anemia 12/22/2021   Foot pain, bilateral 11/22/2021   Genetic testing 08/17/2021   Family history of breast cancer 07/19/2021   Family history of prostate cancer 07/19/2021   Esophageal dysmotility 06/03/2021   Pulmonary embolism ([place]) 05/21/2021   Congestive heart failure ([place]) 09/28/2020   Physical debility 08/29/2019  Obesity hypoventilation syndrome ([place]) 08/23/2019   Skin breakdown 08/23/2019   Essential hypertension 08/23/2019   Liver hemorrhage 08/23/2019   CVA (cerebral vascular accident) ([place]) 08/19/2019   Major depressive disorder with single episode, in partial remission ([place]) 08/19/2019   Morbid obesity with BMI of 50.0-59.9, adult ([place]) 08/19/2019   SLE (systemic  lupus erythematosus related syndrome) ([place]) 08/19/2019   Hepatic adenoma    Sciatica 04/20/2018   Type 2 diabetes mellitus with hyperlipidemia ([place]) 09/19/2015   Radiculopathy of cervical spine    Family history of malignant neoplasm of breast    Family history of malignant neoplasm of ovary    Intractable nausea and vomiting 11/11/2013   Septate uterus 09/08/2013   Diabetic gastroparesis ([place]) 04/28/2013   Constipation due to pain medication 04/21/2013   Odynophagia 04/04/2013   Liver masses 02/28/2013   Reflux esophagitis 02/26/2013   Morbid obesity ([place]) 10/10/2012   Chronic diastolic CHF (congestive heart failure) ([place]) 10/09/2012   Clinical polymyositis syndrome ([place]) 09/06/2012   Mononeuritis of lower limb 01/12/2012   Hypertension complicating diabetes ([place]) 63/84/6659   Chronic systolic CHF (congestive heart failure) ([place]) 11/08/2011   HLD (hyperlipidemia) 07/19/2011   Bell's palsy 08/09/2010   SLE 08/09/2010   SJOGREN'S SYNDROME 08/09/2010   PCP:  Bartholome Bill, MD Pharmacy:   [id number] [place] [address]Phone: [phone]: [phone] [address] [place]Phone: [phone]: [phone] [facility] [address]Phone: [phone]: [phone] [place] [place] [address]Phone: [phone]: [phone]    Social Determinants of Health (SDOH) Interventions    Readmission Risk Interventions    05/26/2021    4:02 PM 02/05/2020   10:31 AM  Readmission Risk Prevention Plan  Transportation Screening Complete Complete  Medication Review ([place]  Referral to Pharmacy  PCP or Specialist appointment within 3-5 days of discharge  Complete   HRI or H[place] ChaparralComplete Complete  SW Recovery Care/Counseling Consult Complete Complete  PNavy [place] CityNot Applicable Not Applicable

## 2022-05-11 NOTE — Plan of Care (Signed)

## 2022-05-12 ENCOUNTER — Inpatient Hospital Stay (HOSPITAL_COMMUNITY): Payer: Medicaid Other

## 2022-05-12 DIAGNOSIS — R112 Nausea with vomiting, unspecified: Secondary | ICD-10-CM | POA: Diagnosis not present

## 2022-05-12 DIAGNOSIS — I5022 Chronic systolic (congestive) heart failure: Secondary | ICD-10-CM | POA: Diagnosis not present

## 2022-05-12 DIAGNOSIS — R9431 Abnormal electrocardiogram [ECG] [EKG]: Secondary | ICD-10-CM | POA: Diagnosis not present

## 2022-05-12 LAB — COMPREHENSIVE METABOLIC PANEL
ALT: 10 U/L (ref 0–44)
AST: 13 U/L — ABNORMAL LOW (ref 15–41)
Albumin: 2.9 g/dL — ABNORMAL LOW (ref 3.5–5.0)
Alkaline Phosphatase: 60 U/L (ref 38–126)
Anion gap: 9 (ref 5–15)
BUN: 12 mg/dL (ref 6–20)
CO2: 21 mmol/L — ABNORMAL LOW (ref 22–32)
Calcium: 8.9 mg/dL (ref 8.9–10.3)
Chloride: 113 mmol/L — ABNORMAL HIGH (ref 98–111)
Creatinine, Ser: 0.9 mg/dL (ref 0.44–1.00)
GFR, Estimated: >60 mL/min (ref >60)
Glucose, Bld: 106 mg/dL — ABNORMAL HIGH (ref 70–99)
Potassium: 3.3 mmol/L — ABNORMAL LOW (ref 3.5–5.1)
Sodium: 143 mmol/L (ref 135–145)
Total Bilirubin: 0.8 mg/dL (ref 0.3–1.2)
Total Protein: 5.6 g/dL — ABNORMAL LOW (ref 6.5–8.1)

## 2022-05-12 LAB — GLUCOSE, CAPILLARY
Glucose-Capillary: 140 mg/dL — ABNORMAL HIGH (ref 70–99)
Glucose-Capillary: 134 mg/dL — ABNORMAL HIGH (ref 70–99)
Glucose-Capillary: 122 mg/dL — ABNORMAL HIGH (ref 70–99)
Glucose-Capillary: 105 mg/dL — ABNORMAL HIGH (ref 70–99)
Glucose-Capillary: 103 mg/dL — ABNORMAL HIGH (ref 70–99)

## 2022-05-12 LAB — CORTISOL: Cortisol, Plasma: 5 ug/dL

## 2022-05-12 LAB — MAGNESIUM: Magnesium: 1.8 mg/dL (ref 1.7–2.4)

## 2022-05-12 MED ORDER — COSYNTROPIN 0.25 MG IJ SOLR
0.2500 mg | Freq: Once | INTRAMUSCULAR | Status: AC
  Administered 2022-05-13: 0.25 mg via INTRAVENOUS
  Filled 2022-05-12: qty 0.25

## 2022-05-12 MED ORDER — POTASSIUM CHLORIDE CRYS ER 20 MEQ PO TBCR
40.0000 meq | EXTENDED_RELEASE_TABLET | Freq: Four times a day (QID) | ORAL | Status: AC
  Administered 2022-05-12 (×2): 40 meq via ORAL
  Filled 2022-05-12 (×3): qty 2

## 2022-05-12 MED ORDER — PANTOPRAZOLE SODIUM 40 MG PO TBEC
40.0000 mg | DELAYED_RELEASE_TABLET | Freq: Two times a day (BID) | ORAL | Status: DC
  Administered 2022-05-12 – 2022-05-14 (×5): 40 mg via ORAL
  Filled 2022-05-12 (×5): qty 1

## 2022-05-12 NOTE — Progress Notes (Signed)
[facility] PROGRESS NOTE   SHATON LORE GMW:[id number] DOB: [birth year] DOA: 05/09/2022  PCP: Bartholome Bill, MD  Brief History/Interval Summary: 51 y.o. with a history of morbid obesity, CAD, hypertension, hyperlipidemia, Sjogren's, lupus, presented to emergency department due to intractable nausea and vomiting.  Patient saw her endocrinologist on 9/27.  At that time her Ozempic was increased to '2mg'$ .  Since dose increased patient developed poor p.o. intake nausea and vomiting.  Due to intractable nausea vomitings presented to the ER.  CT abdomen pelvis showed a small hiatal hernia otherwise chronic findings.  Patient was given Benadryl, Pepcid, fentanyl, Dilaudid, Compazine, Phenergan.  EKG was obtained patient was admitted due to concerns for recurrent prolonged QT and poor p.o. intake.  On admission she was endorsing food aversion.  She denied any infectious complaints and states her symptoms are directly correlated to increase in Ozempic.    Consultants: None  Procedures: None    Subjective/Interval History: Patient has not noticed much improvement in her symptoms.  Continues to have nausea and mild upper abdominal discomfort.  Has not had any significant vomiting in the last 24 hours.  She feels that all of her symptoms are due to Ozempic.    Assessment/Plan:  Intractable nausea and vomiting Possibly secondary to Ozempic.  CT abdomen pelvis did not show any acute findings.  LFTs were normal.  Lipase was normal. Ozempic can cause nausea vomiting.  There are reports that patient may have been taking Ozempic for a while and the dose was increased recently.  However patient mentions that this is the first time she took her Ozempic.  Her dose was on Sunday.  This medicine does have a half-life of 1 week. No significant improvement.  Continue PPI and antiemetics as needed.  We will do a right upper quadrant ultrasound to further evaluate her gallbladder.  QT  prolongation Patient takes multiple psychotropic medications including Wellbutrin Seroquel.  Due to QT prolongation Seroquel is currently on hold.  Noted to be on Compazine for nausea.   EKG shows improvement in QT interval.  Should be able to resume Seroquel when her nausea has subsided.  Hypokalemia  Replace potassium.  Magnesium is 1.8.  Chronic systolic CHF Noted to be on goal-directed medical treatment with Entresto, spironolactone.  Also noted to be on Imdur and hydralazine.  On carvedilol.  Cardiac status appears to be stable.  History of PE Continue apixaban  History of depression Continue bupropion and Cymbalta.  History of Sjogren's and lupus Continue Plaquenil  Hypothyroidism Continue levothyroxine.  Hyperlipidemia Continue Crestor  Class III obesity Estimated body mass index is 52.55 kg/m as calculated from the following:   Height as of 04/27/22: '5\' 8"'$  (1.727 m).   Weight as of 04/27/22: 156.8 kg.   DVT Prophylaxis: On apixaban Code Status: Full code Family Communication: Discussed with patient Disposition Plan: Start mobilizing.  Anticipate discharge home when improved  Status is: Inpatient Remains inpatient appropriate because: Intractable nausea and vomiting      Medications: Scheduled:  apixaban  5 mg Oral BID   buPROPion  300 mg Oral Daily   calcium carbonate  800 mg of elemental calcium Oral Once   carvedilol  25 mg Oral BID WC   hydroxychloroquine  200 mg Oral BID   insulin aspart  0-20 Units Subcutaneous TID WC   insulin aspart  0-5 Units Subcutaneous QHS   insulin aspart  12 Units Subcutaneous TID WC   insulin detemir  30 Units Subcutaneous BID  isosorbide-hydrALAZINE  2 tablet Oral TID   levothyroxine  50 mcg Oral Q0600   oxybutynin  5 mg Oral Daily   pantoprazole  40 mg Oral BID   potassium chloride  40 mEq Oral Q6H   rosuvastatin  20 mg Oral Daily   sacubitril-valsartan  1 tablet Oral BID   spironolactone  50 mg Oral Daily    Continuous:  sodium chloride 75 mL/hr at 05/11/22 0915   [id number] **OR** acetaminophen, HYDROmorphone (DILAUDID) injection, LORazepam, prochlorperazine  Antibiotics: Anti-infectives (From admission, onward)    Start     Dose/Rate Route Frequency Ordered Stop   05/10/22 1000  hydroxychloroquine (PLAQUENIL) tablet 200 mg        200 mg Oral 2 times daily 05/10/22 0157         Objective:  Vital Signs  Vitals:   05/11/22 2018 05/12/22 0100 05/12/22 0500 05/12/22 0812  BP: 132/78 112/62 (!) 121/59 126/66  Pulse: 78 84 78 85  Resp: '18 19 20 18  '$ Temp: 97.9 F (36.6 C) 97.6 F (36.4 C) 98.1 F (36.7 C) 97.9 F (36.6 C)  TempSrc: Oral Oral Oral Oral  SpO2: 98%   98%    Intake/Output Summary (Last 24 hours) at 05/12/2022 1113 Last data filed at 05/11/2022 1213 Gross per 24 hour  Intake 480 ml  Output 200 ml  Net 280 ml    There were no vitals filed for this visit.   General appearance: Awake alert.  In no distress Resp: Clear to auscultation bilaterally.  Normal effort Cardio: S1-S2 is normal regular.  No S3-S4.  No rubs murmurs or bruit GI: Abdomen is soft.  Mildly tender in the epigastric area without any rebound rigidity or guarding.  No masses organomegaly.  No significant tenderness in the right upper quadrant. Extremities: No edema.  Full range of motion of lower extremities. Neurologic: Alert and oriented x3.  No focal neurological deficits.    Lab Results:  Data Reviewed: I have personally reviewed following labs and reports of the imaging studies  CBC: Recent Labs  Lab 05/09/22 1432 05/10/22 0008  WBC 9.0 8.8  HGB 12.0 11.4*  HCT 37.1 35.2*  MCV 91.4 91.2  PLT 313 297     Basic Metabolic Panel: Recent Labs  Lab 05/09/22 1432 05/10/22 0008 05/11/22 0051 05/12/22 0232  NA 143  --  140 143  K 4.0  --  3.4* 3.3*  CL 113*  --  113* 113*  CO2 17*  --  21* 21*  GLUCOSE 208*  --  176* 106*  BUN 11  --  18 12  CREATININE 1.13* 1.13*  1.22* 0.90  CALCIUM 9.9  --  8.9 8.9  MG  --   --   --  1.8     GFR: CrCl cannot be calculated (Unknown ideal weight.).  Liver Function Tests: Recent Labs  Lab 05/09/22 1432 05/11/22 0051 05/12/22 0232  AST 19 16 13*  ALT '12 9 10  '$ ALKPHOS 88 66 60  BILITOT 0.8 0.8 0.8  PROT 7.4 6.0* 5.6*  ALBUMIN 4.0 3.3* 2.9*     Recent Labs  Lab 05/09/22 1432  LIPASE 24      CBG: Recent Labs  Lab 05/11/22 1205 05/11/22 1735 05/11/22 2117 05/12/22 0809 05/12/22 1009  GLUCAP 137* 104* 115* 140* 134*      Radiology Studies: No results found.     LOS: 2 days   [place] [facility] Pager on [email or web address]  05/12/2022, 11:13 AM

## 2022-05-12 NOTE — Progress Notes (Signed)
SATURATION QUALIFICATIONS: (This note is used to comply with regulatory documentation for home oxygen)  Patient Saturations on Room Air at Rest = 97%  Patient Saturations on Room Air while Ambulating = 93%  Patient Saturations on 2 Liters of oxygen while Ambulating = 100%  Please briefly explain why patient needs home oxygen: complaints of shortness of breath while ambulating.

## 2022-05-13 DIAGNOSIS — I5022 Chronic systolic (congestive) heart failure: Secondary | ICD-10-CM | POA: Diagnosis not present

## 2022-05-13 DIAGNOSIS — R9431 Abnormal electrocardiogram [ECG] [EKG]: Secondary | ICD-10-CM | POA: Diagnosis not present

## 2022-05-13 DIAGNOSIS — R112 Nausea with vomiting, unspecified: Secondary | ICD-10-CM | POA: Diagnosis not present

## 2022-05-13 LAB — GLUCOSE, CAPILLARY
Glucose-Capillary: 157 mg/dL — ABNORMAL HIGH (ref 70–99)
Glucose-Capillary: 147 mg/dL — ABNORMAL HIGH (ref 70–99)
Glucose-Capillary: 136 mg/dL — ABNORMAL HIGH (ref 70–99)
Glucose-Capillary: 186 mg/dL — ABNORMAL HIGH (ref 70–99)
Glucose-Capillary: 112 mg/dL — ABNORMAL HIGH (ref 70–99)

## 2022-05-13 LAB — BASIC METABOLIC PANEL
Anion gap: 5 (ref 5–15)
BUN: 10 mg/dL (ref 6–20)
CO2: 21 mmol/L — ABNORMAL LOW (ref 22–32)
Calcium: 8.7 mg/dL — ABNORMAL LOW (ref 8.9–10.3)
Chloride: 115 mmol/L — ABNORMAL HIGH (ref 98–111)
Creatinine, Ser: 0.85 mg/dL (ref 0.44–1.00)
GFR, Estimated: >60 mL/min (ref >60)
Glucose, Bld: 104 mg/dL — ABNORMAL HIGH (ref 70–99)
Potassium: 3.7 mmol/L (ref 3.5–5.1)
Sodium: 141 mmol/L (ref 135–145)

## 2022-05-13 LAB — ACTH STIMULATION, 3 TIME POINTS
Cortisol, 30 Min: 20.4 ug/dL
Cortisol, 60 Min: 25.7 ug/dL
Cortisol, Base: 4.7 ug/dL

## 2022-05-13 MED ORDER — STERILE WATER FOR INJECTION IJ SOLN
INTRAMUSCULAR | Status: AC
  Administered 2022-05-13: 10 mL
  Filled 2022-05-13: qty 10

## 2022-05-13 MED ORDER — POTASSIUM CHLORIDE CRYS ER 20 MEQ PO TBCR
40.0000 meq | EXTENDED_RELEASE_TABLET | Freq: Once | ORAL | Status: AC
  Administered 2022-05-13: 40 meq via ORAL
  Filled 2022-05-13: qty 2

## 2022-05-13 MED ORDER — SUCRALFATE 1 GM/10ML PO SUSP
1.0000 g | Freq: Three times a day (TID) | ORAL | Status: DC
  Administered 2022-05-13 – 2022-05-14 (×4): 1 g via ORAL
  Filled 2022-05-13 (×4): qty 10

## 2022-05-13 NOTE — Progress Notes (Signed)
[facility] PROGRESS NOTE   [name] ZSW:[id number] DOB: 1971-04-30 DOA: 05/09/2022  PCP: Bartholome Bill, MD  Brief History/Interval Summary: 51 y.o. with a history of morbid obesity, CAD, hypertension, hyperlipidemia, Sjogren's, lupus, presented to emergency department due to intractable nausea and vomiting.  Patient saw her endocrinologist on 9/27.  At that time her Ozempic was increased to '2mg'$ .  Since dose increased patient developed poor p.o. intake nausea and vomiting.  Due to intractable nausea vomitings presented to the ER.  CT abdomen pelvis showed a small hiatal hernia otherwise chronic findings.  Patient was given Benadryl, Pepcid, fentanyl, Dilaudid, Compazine, Phenergan.  EKG was obtained patient was admitted due to concerns for recurrent prolonged QT and poor p.o. intake.  On admission she was endorsing food aversion.  She denied any infectious complaints and states her symptoms are directly correlated to increase in Ozempic.    Consultants: None  Procedures: None    Subjective/Interval History: Patient does feel slightly better today compared to yesterday.  Was able to tolerate her full liquid diet.  Occasional nausea still present.  Still has upper abdominal discomfort.  Was able to ambulate yesterday.     Assessment/Plan:  Intractable nausea and vomiting Possibly secondary to Ozempic.  CT abdomen pelvis did not show any acute findings.  LFTs were normal.  Lipase was normal. Ozempic can cause nausea vomiting.  There are reports that patient may have been taking Ozempic for a while and the dose was increased recently.  However patient mentions that this is the first time she took her Ozempic.  Her dose was on Sunday.  This medicine does have a half-life of 1 week. Due to persistent symptoms ultrasound of the right upper quadrant was done which did not show any abnormalities in the gallbladder.  LFTs have been normal.  We will give her Carafate today.  She  does feel a little better this morning.  We will advance to soft diet and see how she does.  Continue PPI. Random cortisol level was noted to be low.  ACTH stimulation test shows normal response.    QT prolongation Patient takes multiple psychotropic medications including Wellbutrin Seroquel.  Due to QT prolongation Seroquel is currently on hold.  Noted to be on Compazine for nausea.   EKG 10/12 shows improvement in QT interval.  Should be able to resume Seroquel when her nausea has subsided.  Hypokalemia  Potassium level has improved.  Chronic systolic CHF Noted to be on goal-directed medical treatment with Entresto, spironolactone.  Also noted to be on Imdur and hydralazine.  On carvedilol.  Cardiac status appears to be stable.  History of PE Continue apixaban.  Stable.  No evidence for bleeding.  History of depression Continue bupropion and Cymbalta.  History of Sjogren's and lupus Continue Plaquenil  Hypothyroidism Continue levothyroxine.  Hyperlipidemia Continue Crestor  Class III obesity Estimated body mass index is 52.55 kg/m as calculated from the following:   Height as of 04/27/22: '5\' 8"'$  (1.727 m).   Weight as of 04/27/22: 156.8 kg.   DVT Prophylaxis: On apixaban Code Status: Full code Family Communication: Discussed with patient Disposition Plan: Continue to mobilize.  Anticipate discharge in 24 hours.  Status is: Inpatient Remains inpatient appropriate because: Intractable nausea and vomiting      Medications: Scheduled:  apixaban  5 mg Oral BID   buPROPion  300 mg Oral Daily   calcium carbonate  800 mg of elemental calcium Oral Once   carvedilol  25  mg Oral BID WC   hydroxychloroquine  200 mg Oral BID   insulin aspart  0-20 Units Subcutaneous TID WC   insulin aspart  0-5 Units Subcutaneous QHS   insulin aspart  12 Units Subcutaneous TID WC   insulin detemir  30 Units Subcutaneous BID   isosorbide-hydrALAZINE  2 tablet Oral TID   levothyroxine  50  mcg Oral Q0600   oxybutynin  5 mg Oral Daily   pantoprazole  40 mg Oral BID   rosuvastatin  20 mg Oral Daily   sacubitril-valsartan  1 tablet Oral BID   spironolactone  50 mg Oral Daily   sucralfate  1 g Oral TID WC & HS   Continuous:  sodium chloride 75 mL/hr at 05/12/22 1853   EHM:[id number] **OR** acetaminophen, HYDROmorphone (DILAUDID) injection, LORazepam, prochlorperazine  Antibiotics: Anti-infectives (From admission, onward)    Start     Dose/Rate Route Frequency Ordered Stop   05/10/22 1000  hydroxychloroquine (PLAQUENIL) tablet 200 mg        200 mg Oral 2 times daily 05/10/22 0157         Objective:  Vital Signs  Vitals:   05/12/22 1900 05/12/22 2320 05/13/22 0444 05/13/22 0901  BP: 101/66 (!) 105/57 99/60 121/76  Pulse: 81 82 78 86  Resp: '18 18 18   '$ Temp: 98.2 F (36.8 C) 98.7 F (37.1 C) 98.6 F (37 C) 97.6 F (36.4 C)  TempSrc: Oral Oral Oral Oral  SpO2: 97% 97%  97%    Intake/Output Summary (Last 24 hours) at 05/13/2022 1020 Last data filed at 05/12/2022 1402 Gross per 24 hour  Intake 360 ml  Output --  Net 360 ml    There were no vitals filed for this visit.   General appearance: Awake alert.  In no distress Resp: Clear to auscultation bilaterally.  Normal effort Cardio: S1-S2 is normal regular.  No S3-S4.  No rubs murmurs or bruit GI: Abdomen is soft.  Less tender in the epigastric area today compared to yesterday.  No masses organomegaly.  Bowel sounds present normal. Extremities: No edema.  Full range of motion of lower extremities. Neurologic: Alert and oriented x3.  No focal neurological deficits.     Lab Results:  Data Reviewed: I have personally reviewed following labs and reports of the imaging studies  CBC: Recent Labs  Lab 05/09/22 1432 05/10/22 0008  WBC 9.0 8.8  HGB 12.0 11.4*  HCT 37.1 35.2*  MCV 91.4 91.2  PLT 313 297     Basic Metabolic Panel: Recent Labs  Lab 05/09/22 1432 05/10/22 0008 05/11/22 0051  05/12/22 0232 05/13/22 0653  NA 143  --  140 143 141  K 4.0  --  3.4* 3.3* 3.7  CL 113*  --  113* 113* 115*  CO2 17*  --  21* 21* 21*  GLUCOSE 208*  --  176* 106* 104*  BUN 11  --  '18 12 10  '$ CREATININE 1.13* 1.13* 1.22* 0.90 0.85  CALCIUM 9.9  --  8.9 8.9 8.7*  MG  --   --   --  1.8  --      GFR: CrCl cannot be calculated (Unknown ideal weight.).  Liver Function Tests: Recent Labs  Lab 05/09/22 1432 05/11/22 0051 05/12/22 0232  AST 19 16 13*  ALT '12 9 10  '$ ALKPHOS 88 66 60  BILITOT 0.8 0.8 0.8  PROT 7.4 6.0* 5.6*  ALBUMIN 4.0 3.3* 2.9*     Recent Labs  Lab 05/09/22 1432  LIPASE 24      CBG: Recent Labs  Lab 05/12/22 1009 05/12/22 1140 05/12/22 1620 05/12/22 2117 05/13/22 0842  GLUCAP 134* 122* 105* 103* 157*      Radiology Studies: US Abdomen Limited RUQ (LIVER/GB)  Result Date: 05/12/2022 CLINICAL DATA:  Nausea and vomiting EXAM: ULTRASOUND ABDOMEN LIMITED RIGHT UPPER QUADRANT COMPARISON:  CT 05/09/2022 FINDINGS: Gallbladder: No gallstones or wall thickening visualized. No sonographic Murphy sign noted by sonographer. Common bile duct: Diameter: 5 mm, normal Liver: No focal lesion identified. Within normal limits in parenchymal echogenicity. Portal vein is patent on color Doppler imaging with normal direction of blood flow towards the liver. Other: None. IMPRESSION: No evidence of cholelithiasis or acute cholecystitis. Electronically Signed   By: Lucienne Capers M.D.   On: 05/12/2022 20:23       LOS: 3 days   [facility] Pager on [email or web address]  05/13/2022, 10:20 AM

## 2022-05-14 DIAGNOSIS — R112 Nausea with vomiting, unspecified: Secondary | ICD-10-CM | POA: Diagnosis not present

## 2022-05-14 LAB — GLUCOSE, CAPILLARY: Glucose-Capillary: 125 mg/dL — ABNORMAL HIGH (ref 70–99)

## 2022-05-14 MED ORDER — PROCHLORPERAZINE MALEATE 10 MG PO TABS: 10.0000 mg | ORAL_TABLET | Freq: Four times a day (QID) | ORAL | 0 refills | Status: DC | PRN

## 2022-05-14 MED ORDER — PANTOPRAZOLE SODIUM 40 MG PO TBEC: DELAYED_RELEASE_TABLET | ORAL | 0 refills | Status: DC

## 2022-05-14 NOTE — Discharge Summary (Signed)
[facility]  Physician Discharge Summary   Patient ID: LASHICA HANNAY MRN: [id number] DOB/AGE: [birth year] 51 y.o.  Admit date: 05/09/2022 Discharge date: 05/14/2022    PCP: Bartholome Bill, MD  DISCHARGE DIAGNOSES:     Nausea & vomiting likely secondary to Ozempic Chronic systolic CHF History of pulmonary embolism History of depression Hypothyroidism   RECOMMENDATIONS FOR OUTPATIENT FOLLOW UP: Patient instructed to follow-up with primary care provider.  Home Health: None Equipment/Devices: None  CODE STATUS: Full code  DISCHARGE CONDITION: fair  Diet recommendation: As before  INITIAL HISTORY: 51 y.o. with a history of morbid obesity, CAD, hypertension, hyperlipidemia, Sjogren's, lupus, presented to emergency department due to intractable nausea and vomiting.  Patient saw her endocrinologist on 9/27.  At that time her Ozempic was increased to 48m.  Since dose increased patient developed poor p.o. intake nausea and vomiting.  Due to intractable nausea vomitings presented to the ER.  CT abdomen pelvis showed a small hiatal hernia otherwise chronic findings.  Patient was given Benadryl, Pepcid, fentanyl, Dilaudid, Compazine, Phenergan.  EKG was obtained patient was admitted due to concerns for recurrent prolonged QT and poor p.o. intake.  On admission she was endorsing food aversion.  She denied any infectious complaints and states her symptoms are directly correlated to increase in Ozempic.   HOSPITAL COURSE:   Intractable nausea and vomiting Possibly secondary to Ozempic.  CT abdomen pelvis did not show any acute findings.  LFTs were normal.  Lipase was normal. Ozempic can cause nausea vomiting.  There are reports that patient may have been taking Ozempic for a while and the dose was increased recently.  However patient mentions that this is the first time she took her Ozempic.  Her dose was on Sunday.  This medicine does have a half-life of 1 week. Due to  persistent symptoms ultrasound of the right upper quadrant was done which did not show any abnormalities in the gallbladder.  LFTs have been normal. She was also given Carafate.  Diet was gradually advanced which she has been tolerating well.  Has been ambulating.  Will be discharged on twice daily PPI.  She was asked to follow-up with her primary care provider.  She may need referral to gastroenterology if her symptoms do not improve in a week or so. Random cortisol level was noted to be low.  ACTH stimulation test shows normal response.     QT prolongation Patient takes multiple psychotropic medications including Wellbutrin Seroquel.  Due to QT prolongation Seroquel is currently on hold.  Noted to be on Compazine for nausea.   EKG 10/12 shows improvement in QT interval.  Should be able to resume Seroquel.  Hypokalemia  Potassium level has improved.   Chronic systolic CHF Noted to be on goal-directed medical treatment with Entresto, spironolactone.  Also noted to be on Imdur and hydralazine.  On carvedilol.  Cardiac status appears to be stable.   History of PE Continue apixaban.  Stable.  No evidence for bleeding.   History of depression Continue bupropion and Cymbalta.   History of Sjogren's and lupus Continue Plaquenil   Hypothyroidism Continue levothyroxine.   Hyperlipidemia Continue Crestor   Class III obesity Estimated body mass index is 52.55 kg/m as calculated from the following:   Height as of 04/27/22: 5' 8"  (1.727 m).   Weight as of 04/27/22: 156.8 kg.   Patient is stable.  Okay for discharge home today.   PERTINENT LABS:  The results of significant diagnostics from this hospitalization (  including imaging, microbiology, ancillary and laboratory) are listed below for reference.     Labs:   Basic Metabolic Panel: Recent Labs  Lab 05/09/22 1432 05/10/22 0008 05/11/22 0051 05/12/22 0232 05/13/22 0653  NA 143  --  140 143 141  K 4.0  --  3.4* 3.3* 3.7  CL  113*  --  113* 113* 115*  CO2 17*  --  21* 21* 21*  GLUCOSE 208*  --  176* 106* 104*  BUN 11  --  18 12 10   CREATININE 1.13* 1.13* 1.22* 0.90 0.85  CALCIUM 9.9  --  8.9 8.9 8.7*  MG  --   --   --  1.8  --    Liver Function Tests: Recent Labs  Lab 05/09/22 1432 05/11/22 0051 05/12/22 0232  AST 19 16 13*  ALT 12 9 10   ALKPHOS 88 66 60  BILITOT 0.8 0.8 0.8  PROT 7.4 6.0* 5.6*  ALBUMIN 4.0 3.3* 2.9*   Recent Labs  Lab 05/09/22 1432  LIPASE 24    CBC: Recent Labs  Lab 05/09/22 1432 05/10/22 0008  WBC 9.0 8.8  HGB 12.0 11.4*  HCT 37.1 35.2*  MCV 91.4 91.2  PLT 313 297    BNP: BNP (last 3 results) Recent Labs    05/21/21 0208 07/26/21 0400 02/25/22 0144  BNP 17.9 32.9 14.5     CBG: Recent Labs  Lab 05/13/22 1020 05/13/22 1224 05/13/22 1627 05/13/22 2104 05/14/22 0805  GLUCAP 147* 136* 186* 112* 125*     IMAGING STUDIES US Abdomen Limited RUQ (LIVER/GB)  Result Date: 05/12/2022 CLINICAL DATA:  Nausea and vomiting EXAM: ULTRASOUND ABDOMEN LIMITED RIGHT UPPER QUADRANT COMPARISON:  CT 05/09/2022 FINDINGS: Gallbladder: No gallstones or wall thickening visualized. No sonographic Murphy sign noted by sonographer. Common bile duct: Diameter: 5 mm, normal Liver: No focal lesion identified. Within normal limits in parenchymal echogenicity. Portal vein is patent on color Doppler imaging with normal direction of blood flow towards the liver. Other: None. IMPRESSION: No evidence of cholelithiasis or acute cholecystitis. Electronically Signed   By: Lucienne Capers M.D.   On: 05/12/2022 20:23   CT ABDOMEN PELVIS W CONTRAST  Result Date: 05/09/2022 CLINICAL DATA:  Abdominal pain, acute, nonlocalized. Nausea and vomiting. EXAM: CT ABDOMEN AND PELVIS WITH CONTRAST TECHNIQUE: Multidetector CT imaging of the abdomen and pelvis was performed using the standard protocol following bolus administration of intravenous contrast. RADIATION DOSE REDUCTION: This exam was performed  according to the departmental dose-optimization program which includes automated exposure control, adjustment of the mA and/or kV according to patient size and/or use of iterative reconstruction technique. CONTRAST:  141m OMNIPAQUE IOHEXOL 350 MG/ML SOLN COMPARISON:  CT examination dated February 18, 2021 FINDINGS: Lower chest: No acute abnormality.  Left basilar atelectasis. Hepatobiliary: No focal liver abnormality is seen. No gallstones, gallbladder wall thickening, or biliary dilatation. Pancreas: Unremarkable. No pancreatic ductal dilatation or surrounding inflammatory changes. Spleen: Normal in size without focal abnormality. Adrenals/Urinary Tract: Adrenal glands are unremarkable. Kidneys are normal, without renal calculi, focal lesion, or hydronephrosis. Bladder is unremarkable. Stomach/Bowel: Small hiatal hernia. Stomach is within normal limits. Appendix appears normal. No evidence of bowel wall thickening, distention, or inflammatory changes. Vascular/Lymphatic: No significant vascular findings are present. No enlarged abdominal or pelvic lymph nodes. Reproductive: Uterus and bilateral adnexa are unremarkable. Other: No abdominal wall hernia or abnormality. No abdominopelvic ascites. Musculoskeletal: Degenerate disc disease of the lumbar spine with associated facet joint arthropathy prominent at L4-L5 and L5-S1. IMPRESSION: 1. Bowel  loops are normal in caliber. No evidence of appendicitis, colitis or diverticulitis. 2. Small hiatal hernia. 3. No evidence of nephrolithiasis or hydronephrosis. 4. Degenerate disc disease of the lumbar spine prominent at L4-L5 and L5-S1 with associated facet joint arthropathy. 5. No CT evidence of acute abdominal/pelvic process. Electronically Signed   By: Keane Police D.O.   On: 05/09/2022 22:57   DG Chest 2 View  Result Date: 05/09/2022 CLINICAL DATA:  Chest pain. Midline chest pain and shortness of breath. EXAM: CHEST - 2 VIEW COMPARISON:  02/25/2022 FINDINGS: Mild  cardiomegaly is similar to prior. Stable mediastinal contours. No pulmonary edema, focal airspace disease, pleural effusion or pneumothorax. On limited assessment, no acute osseous findings. IMPRESSION: Stable mild cardiomegaly.  No acute pulmonary process. Electronically Signed   By: Keith Rake M.D.   On: 05/09/2022 15:01    DISCHARGE EXAMINATION: Vitals:   05/13/22 1946 05/13/22 2317 05/14/22 0313 05/14/22 0743  BP: 127/71 (!) 108/59 124/72 123/74  Pulse: 86 80 92 80  Resp: 18 18 18 18   Temp: 98.5 F (36.9 C) 97.8 F (36.6 C) 97.8 F (36.6 C) 98 F (36.7 C)  TempSrc: Oral Oral Oral Oral  SpO2: 99% 98% 99% 99%   General appearance: Awake alert.  In no distress Resp: Clear to auscultation bilaterally.  Normal effort Cardio: S1-S2 is normal regular.  No S3-S4.  No rubs murmurs or bruit GI: Abdomen is soft.  Tenderness in the epigastric area has improved.   DISPOSITION: Home  Discharge Instructions     Call MD for:  difficulty breathing, headache or visual disturbances   Complete by: As directed    Call MD for:  extreme fatigue   Complete by: As directed    Call MD for:  persistant dizziness or light-headedness   Complete by: As directed    Call MD for:  persistant nausea and vomiting   Complete by: As directed    Call MD for:  severe uncontrolled pain   Complete by: As directed    Call MD for:  temperature >100.4   Complete by: As directed    Discharge instructions   Complete by: As directed    Please take your medications as prescribed.  As discussed please follow-up with your primary care provider if your symptoms persist.  You may need a referral to gastroenterology at that time.  Attention immediately if your symptoms worsen.  Eat a soft, bland diet for the next few days.  You were cared for by a hospitalist during your hospital stay. If you have any questions about your discharge medications or the care you received while you were in the hospital after you are  discharged, you can call the unit and asked to speak with the hospitalist on call if the hospitalist that took care of you is not available. Once you are discharged, your primary care physician will handle any further medical issues. Please note that NO REFILLS for any discharge medications will be authorized once you are discharged, as it is imperative that you return to your primary care physician (or establish a relationship with a primary care physician if you do not have one) for your aftercare needs so that they can reassess your need for medications and monitor your lab values. If you do not have a primary care physician, you can call [phone] for a physician referral.   Increase activity slowly   Complete by: As directed          Allergies as of  05/14/2022       Reactions   Metoclopramide Other (See Comments)   Developed restless leg, akathisia type limb movements.    Codeine Itching, Rash, Other (See Comments)   Hydrocodone Rash   Penicillins Itching   Tolerated Unasyn 02/01/2020   Percocet [oxycodone-acetaminophen] Hives, Rash   Tolerates dilaudid Tolerates acetaminophen         Medication List     STOP taking these medications    amLODipine 10 MG tablet Commonly known as: NORVASC   Ozempic (1 MG/DOSE) 4 MG/3ML Sopn Generic drug: Semaglutide (1 MG/DOSE)       TAKE these medications    Accu-Chek Aviva Plus test strip Generic drug: glucose blood USE TO TEST BLOOD SUGAR 4 TIMES DAILY   Accu-Chek Guide test strip Generic drug: glucose blood 1 each by Other route 2 (two) times daily. And lancets 2/day   OneTouch Ultra test strip Generic drug: glucose blood Test blood sugar 4 times a day   Advair HFA 230-21 MCG/ACT inhaler Generic drug: fluticasone-salmeterol Inhale 1-2 puffs into the lungs 2 (two) times daily.   albuterol (2.5 MG/3ML) 0.083% nebulizer solution Commonly known as: PROVENTIL Take 2.5 mg by nebulization every 6 (six) hours as needed for  wheezing.   ALPRAZolam 0.5 MG tablet Commonly known as: XANAX Take 0.5 mg 2 (two) times daily as needed by mouth for anxiety or sleep.   ARIPiprazole 5 MG tablet Commonly known as: ABILIFY Take 5 mg by mouth daily.   Azathioprine 75 MG Tabs Take 150 mg by mouth 2 (two) times daily.   B-D UF III MINI PEN NEEDLES 31G X 5 MM Misc Generic drug: Insulin Pen Needle USE AS DIRECTED TWICE A DAY   Easy Touch Pen Needles 32G X 4 MM Misc Generic drug: Insulin Pen Needle USE TO INJECT INSULIN TWICE DAILY   Benlysta 200 MG/ML Soaj Generic drug: Belimumab Inject 200 mg into the skin once a week. Thursdays   buPROPion 300 MG 24 hr tablet Commonly known as: WELLBUTRIN XL Take 300 mg by mouth daily.   carvedilol 25 MG tablet Commonly known as: COREG Take 1.5 tablets (37.5 mg total) by mouth 2 (two) times daily with a meal.   cromolyn 4 % ophthalmic solution Commonly known as: OPTICROM Place 1 drop into both eyes 2 (two) times daily.   cycloSPORINE 0.05 % ophthalmic emulsion Commonly known as: RESTASIS Place 1 drop into both eyes in the morning, at noon, and at bedtime.   diclofenac Sodium 1 % Gel Commonly known as: VOLTAREN APPLY 2 GRAMS TO AFFECTED AREA 4 TIMES A DAY What changed: See the new instructions.   Eliquis 5 MG Tabs tablet Generic drug: apixaban Take 1 tablet (5 mg total) by mouth 2 (two) times daily.   empagliflozin 10 MG Tabs tablet Commonly known as: Jardiance Take 1 tablet (10 mg total) by mouth daily with breakfast.   Entresto 97-103 MG Generic drug: sacubitril-valsartan TAKE 1 TABLET BY MOUTH TWICE A DAY   famotidine 20 MG tablet Commonly known as: PEPCID Take 20 mg by mouth 2 (two) times daily.   fluticasone 50 MCG/ACT nasal spray Commonly known as: FLONASE Place 2 sprays into both nostrils continuous as needed for allergies or rhinitis.   HumuLIN R U-500 KwikPen 500 UNIT/ML KwikPen Generic drug: insulin regular human CONCENTRATED 100 Units, 30  minutes before each meal What changed:  how much to take how to take this when to take this   hydroxychloroquine 200 MG tablet Commonly known  as: PLAQUENIL Take 200 mg by mouth 2 (two) times daily.   Insulin Syringe-Needle U-100 31G X 5/16" 1 ML Misc Commonly known as: Easy Touch Insulin Syringe USE AS DIRECTED THREE TIMES A DAY   isosorbide-hydrALAZINE 20-37.5 MG tablet Commonly known as: BiDil TAKE TWO (2) TABLETS BY MOUTH THREE TIMES DAILY What changed:  how much to take how to take this when to take this additional instructions   levothyroxine 50 MCG tablet Commonly known as: SYNTHROID Take 1 tablet (50 mcg total) by mouth daily at 6 (six) AM.   megestrol 40 MG tablet Commonly known as: MEGACE Take 40 mg by mouth daily.   metFORMIN 500 MG 24 hr tablet Commonly known as: GLUCOPHAGE-XR Take 2 tablets (1,000 mg total) by mouth 2 (two) times daily.   multivitamin with minerals Tabs tablet Take 1 tablet by mouth daily.   ONE TOUCH ULTRA 2 w/Device Kit Check blood sugar daily   onetouch ultrasoft lancets Test blood sugar 4 times a day   oxybutynin 5 MG 24 hr tablet Commonly known as: DITROPAN-XL Take 5 mg by mouth daily.   pantoprazole 40 MG tablet Commonly known as: PROTONIX Take 1 tablet twice a day for 2 weeks and then once a day for 2 weeks   pregabalin 75 MG capsule Commonly known as: LYRICA Take 75 mg 3 (three) times daily by mouth.   prochlorperazine 10 MG tablet Commonly known as: COMPAZINE Take 1 tablet (10 mg total) by mouth every 6 (six) hours as needed for nausea or vomiting.   QUEtiapine 300 MG tablet Commonly known as: SEROQUEL Take 300 mg by mouth at bedtime.   rosuvastatin 20 MG tablet Commonly known as: CRESTOR Take 1 tablet (20 mg total) by mouth daily.   spironolactone 50 MG tablet Commonly known as: ALDACTONE TAKE 1 TABLET BY MOUTH EVERY DAY   sucralfate 1 GM/10ML suspension Commonly known as: CARAFATE Take 10 mLs by mouth 2  (two) times daily as needed (abdomen pain).   topiramate 25 MG tablet Commonly known as: TOPAMAX Take 75 mg by mouth at bedtime.   torsemide 20 MG tablet Commonly known as: DEMADEX Take 1 tablet (20 mg total) by mouth at bedtime.   Transderm-Scop 1 MG/3DAYS Generic drug: scopolamine Place 1 patch onto the skin every 3 (three) days. As needed for nausea          Follow-up Information     Bartholome Bill, MD. Schedule an appointment as soon as possible for a visit in 1 week(s).   Specialty: Family Medicine Why: post hospitalization follow up Contact information: [address] [place] [address] [phone]                 TOTAL DISCHARGE TIME: 72 minutes  [place]  [facility] Pager on [email or web address]  05/15/2022, 1:33 PM

## 2022-05-14 NOTE — Plan of Care (Signed)

## 2022-05-14 NOTE — Progress Notes (Signed)
Discharge paperwork reviewed with patient at this time. No further requests have been made. IV has been removed and personal belongings have been returned to patient.

## 2022-05-14 NOTE — Plan of Care (Signed)
Problem: Education: Goal: Ability to describe self-care measures that may prevent or decrease complications (Diabetes Survival Skills Education) will improve 05/14/2022 1028 by Vonna Kotyk, RN Outcome: Adequate for Discharge 05/14/2022 0743 by Vonna Kotyk, RN Outcome: Progressing Goal: Individualized Educational Video(s) 05/14/2022 1028 by Vonna Kotyk, RN Outcome: Adequate for Discharge 05/14/2022 0743 by Vonna Kotyk, RN Outcome: Progressing   Problem: Coping: Goal: Ability to adjust to condition or change in health will improve 05/14/2022 1028 by Vonna Kotyk, RN Outcome: Adequate for Discharge 05/14/2022 0743 by Vonna Kotyk, RN Outcome: Progressing   Problem: Fluid Volume: Goal: Ability to maintain a balanced intake and output will improve 05/14/2022 1028 by Vonna Kotyk, RN Outcome: Adequate for Discharge 05/14/2022 0743 by Vonna Kotyk, RN Outcome: Progressing   Problem: Health Behavior/Discharge Planning: Goal: Ability to identify and utilize available resources and services will improve 05/14/2022 1028 by Vonna Kotyk, RN Outcome: Adequate for Discharge 05/14/2022 0743 by Vonna Kotyk, RN Outcome: Progressing Goal: Ability to manage health-related needs will improve 05/14/2022 1028 by Vonna Kotyk, RN Outcome: Adequate for Discharge 05/14/2022 0743 by Vonna Kotyk, RN Outcome: Progressing   Problem: Metabolic: Goal: Ability to maintain appropriate glucose levels will improve 05/14/2022 1028 by Vonna Kotyk, RN Outcome: Adequate for Discharge 05/14/2022 0743 by Vonna Kotyk, RN Outcome: Progressing   Problem: Nutritional: Goal: Maintenance of adequate nutrition will improve 05/14/2022 1028 by Vonna Kotyk, RN Outcome: Adequate for Discharge 05/14/2022 0743 by Vonna Kotyk, RN Outcome: Progressing Goal: Progress toward achieving an optimal weight will improve 05/14/2022 1028 by Vonna Kotyk, RN Outcome: Adequate for  Discharge 05/14/2022 0743 by Vonna Kotyk, RN Outcome: Progressing   Problem: Skin Integrity: Goal: Risk for impaired skin integrity will decrease 05/14/2022 1028 by Vonna Kotyk, RN Outcome: Adequate for Discharge 05/14/2022 0743 by Vonna Kotyk, RN Outcome: Progressing   Problem: Tissue Perfusion: Goal: Adequacy of tissue perfusion will improve 05/14/2022 1028 by Vonna Kotyk, RN Outcome: Adequate for Discharge 05/14/2022 0743 by Vonna Kotyk, RN Outcome: Progressing   Problem: Education: Goal: Knowledge of General Education information will improve Description: Including pain rating scale, medication(s)/side effects and non-pharmacologic comfort measures 05/14/2022 1028 by Vonna Kotyk, RN Outcome: Adequate for Discharge 05/14/2022 0743 by Vonna Kotyk, RN Outcome: Progressing   Problem: Health Behavior/Discharge Planning: Goal: Ability to manage health-related needs will improve 05/14/2022 1028 by Vonna Kotyk, RN Outcome: Adequate for Discharge 05/14/2022 0743 by Vonna Kotyk, RN Outcome: Progressing   Problem: Clinical Measurements: Goal: Ability to maintain clinical measurements within normal limits will improve 05/14/2022 1028 by Vonna Kotyk, RN Outcome: Adequate for Discharge 05/14/2022 0743 by Vonna Kotyk, RN Outcome: Progressing Goal: Will remain free from infection 05/14/2022 1028 by Vonna Kotyk, RN Outcome: Adequate for Discharge 05/14/2022 0743 by Vonna Kotyk, RN Outcome: Progressing Goal: Diagnostic test results will improve 05/14/2022 1028 by Vonna Kotyk, RN Outcome: Adequate for Discharge 05/14/2022 0743 by Vonna Kotyk, RN Outcome: Progressing Goal: Respiratory complications will improve 05/14/2022 1028 by Vonna Kotyk, RN Outcome: Adequate for Discharge 05/14/2022 0743 by Vonna Kotyk, RN Outcome: Progressing Goal: Cardiovascular complication will be avoided 05/14/2022 1028 by Vonna Kotyk, RN Outcome: Adequate for  Discharge 05/14/2022 0743 by Vonna Kotyk, RN Outcome: Progressing   Problem: Activity: Goal: Risk for activity intolerance will decrease 05/14/2022 1028 by Tyleah Loh A, RN Outcome: Adequate for Discharge  05/14/2022 0743 by Vonna Kotyk, RN Outcome: Progressing   Problem: Nutrition: Goal: Adequate nutrition will be maintained 05/14/2022 1028 by Vonna Kotyk, RN Outcome: Adequate for Discharge 05/14/2022 0743 by Vonna Kotyk, RN Outcome: Progressing   Problem: Coping: Goal: Level of anxiety will decrease 05/14/2022 1028 by Vonna Kotyk, RN Outcome: Adequate for Discharge 05/14/2022 0743 by Vonna Kotyk, RN Outcome: Progressing   Problem: Elimination: Goal: Will not experience complications related to bowel motility 05/14/2022 1028 by Vonna Kotyk, RN Outcome: Adequate for Discharge 05/14/2022 0743 by Vonna Kotyk, RN Outcome: Progressing Goal: Will not experience complications related to urinary retention 05/14/2022 1028 by Vonna Kotyk, RN Outcome: Adequate for Discharge 05/14/2022 0743 by Vonna Kotyk, RN Outcome: Progressing   Problem: Pain Managment: Goal: General experience of comfort will improve 05/14/2022 1028 by Vonna Kotyk, RN Outcome: Adequate for Discharge 05/14/2022 0743 by Vonna Kotyk, RN Outcome: Progressing   Problem: Safety: Goal: Ability to remain free from injury will improve 05/14/2022 1028 by Vonna Kotyk, RN Outcome: Adequate for Discharge 05/14/2022 0743 by Vonna Kotyk, RN Outcome: Progressing   Problem: Skin Integrity: Goal: Risk for impaired skin integrity will decrease 05/14/2022 1028 by Vonna Kotyk, RN Outcome: Adequate for Discharge 05/14/2022 0743 by Vonna Kotyk, RN Outcome: Progressing

## 2022-05-20 ENCOUNTER — Other Ambulatory Visit: Payer: Self-pay

## 2022-05-20 DIAGNOSIS — E1165 Type 2 diabetes mellitus with hyperglycemia: Secondary | ICD-10-CM

## 2022-05-20 MED ORDER — HUMULIN R U-500 KWIKPEN 500 UNIT/ML ~~LOC~~ SOPN: PEN_INJECTOR | SUBCUTANEOUS | 0 refills | Status: DC

## 2022-05-23 ENCOUNTER — Other Ambulatory Visit (INDEPENDENT_AMBULATORY_CARE_PROVIDER_SITE_OTHER): Payer: Medicaid Other

## 2022-05-23 DIAGNOSIS — Z794 Long term (current) use of insulin: Secondary | ICD-10-CM

## 2022-05-23 DIAGNOSIS — E1165 Type 2 diabetes mellitus with hyperglycemia: Secondary | ICD-10-CM | POA: Diagnosis not present

## 2022-05-23 LAB — BASIC METABOLIC PANEL
BUN: 10 mg/dL (ref 6–23)
CO2: 22 mEq/L (ref 19–32)
Calcium: 8.9 mg/dL (ref 8.4–10.5)
Chloride: 113 mEq/L — ABNORMAL HIGH (ref 96–112)
Creatinine, Ser: 0.83 mg/dL (ref 0.40–1.20)
GFR: 81.44 mL/min (ref >60.00)
Glucose, Bld: 155 mg/dL — ABNORMAL HIGH (ref 70–99)
Potassium: 3.7 mEq/L (ref 3.5–5.1)
Sodium: 141 mEq/L (ref 135–145)

## 2022-05-23 LAB — HEMOGLOBIN A1C: Hgb A1c MFr Bld: 7.4 % — ABNORMAL HIGH (ref 4.6–6.5)

## 2022-05-25 ENCOUNTER — Encounter: Payer: Self-pay | Admitting: Endocrinology

## 2022-05-25 ENCOUNTER — Ambulatory Visit: Payer: Medicaid Other | Admitting: Endocrinology

## 2022-05-25 VITALS — BP 122/88 | HR 99 | Ht 68.0 in | Wt 337.0 lb

## 2022-05-25 DIAGNOSIS — Z794 Long term (current) use of insulin: Secondary | ICD-10-CM | POA: Diagnosis not present

## 2022-05-25 DIAGNOSIS — E1165 Type 2 diabetes mellitus with hyperglycemia: Secondary | ICD-10-CM | POA: Diagnosis not present

## 2022-05-25 MED ORDER — EMPAGLIFLOZIN 25 MG PO TABS: 25.0000 mg | ORAL_TABLET | Freq: Every day | ORAL | 5 refills | Status: DC

## 2022-05-25 MED ORDER — FREESTYLE LIBRE 3 SENSOR MISC: 1.0000 | 2 refills | Status: DC

## 2022-05-25 NOTE — Progress Notes (Signed)
Patient ID: SHALAUNDA WEATHERHOLTZ, female   DOB: May 11, 1971, 51 y.o.   MRN: [id number]           Reason for Appointment: Type II Diabetes follow-up   History of Present Illness   Diagnosis date: 2011  Previous history:  Non-insulin hypoglycemic drugs previously used: Metformin, Victoza, Trulicity, Farxiga, Byetta Insulin was started in 2014 She has previously used Lantus, Levemir and since about 2019 has used 270 units of NPH once a day  A1c range in the last few years is: 6.4-13.4  Recent history:     Non-insulin hypoglycemic drugs:  metformin ER 1000 mg twice daily, Jardiance 10 mg daily     Insulin regimen: Humulin R U-500, 100 tid before meals    Side effects from medications: Shortness of breath from [place]  Current self management, blood sugar patterns and problems identified:  A1c is 7.4, was 8.1 last She was taking Ozempic on her last visit without difficulty but apparently her pharmacy filled her 2 mg prescription which was an old prescription and with this she had significant nausea and vomiting requiring hospital admission  Subsequently she has not been on Ozempic She has taken Jardiance 10 mg daily since early August without side effects However she says that she has not been able to check her sugar because she ran out of test trips and has not received the freestyle libre Her blood sugar in the lab was 155 fasting Recent weight is the same  Exercise: Minimal Diet management: Not following any meal plan     Hypoglycemia:  none    Glucometer:  ?  1 touch  Blood Glucose readings not available Previously:   PRE-MEAL Fasting Lunch Dinner Bedtime Overall  Glucose range: 150 160 180 200-303   Mean/median:         Dietician visit: Most recent: Years ago     Weight control:  Wt Readings from Last 3 Encounters:  05/25/22 (!) 337 lb (152.9 kg)  04/27/22 (!) 345 lb 9.6 oz (156.8 kg)  03/21/22 (!) 340 lb (154.2 kg)            Diabetes labs:   Lab Results   Component Value Date   HGBA1C 7.4 (H) 05/23/2022   HGBA1C 8.1 (H) 02/21/2022   HGBA1C 8.2 (A) 11/22/2021   Lab Results  Component Value Date   MICROALBUR 7.0 (H) 02/21/2022   LDLCALC 45 02/21/2022   CREATININE 0.83 05/23/2022    Lab Results  Component Value Date   FRUCTOSAMINE 364 (H) 04/27/2022   FRUCTOSAMINE 351 (H) 06/18/2021   FRUCTOSAMINE 240 09/18/2015     Allergies as of 05/25/2022       Reactions   Metoclopramide Other (See Comments)   Developed restless leg, akathisia type limb movements.    Codeine Itching, Rash, Other (See Comments)   Hydrocodone Rash   Penicillins Itching   Tolerated Unasyn 02/01/2020   Percocet [oxycodone-acetaminophen] Hives, Rash   Tolerates dilaudid Tolerates acetaminophen         Medication List        Accurate as of May 25, 2022 10:01 AM. If you have any questions, ask your nurse or doctor.          Accu-Chek Aviva Plus test strip Generic drug: glucose blood USE TO TEST BLOOD SUGAR 4 TIMES DAILY   Accu-Chek Guide test strip Generic drug: glucose blood 1 each by Other route 2 (two) times daily. And lancets 2/day   OneTouch Ultra test strip Generic drug: glucose  blood Test blood sugar 4 times a day   Advair HFA 230-21 MCG/ACT inhaler Generic drug: fluticasone-salmeterol Inhale 1-2 puffs into the lungs 2 (two) times daily.   albuterol (2.5 MG/3ML) 0.083% nebulizer solution Commonly known as: PROVENTIL Take 2.5 mg by nebulization every 6 (six) hours as needed for wheezing.   ALPRAZolam 0.5 MG tablet Commonly known as: XANAX Take 0.5 mg 2 (two) times daily as needed by mouth for anxiety or sleep.   ARIPiprazole 5 MG tablet Commonly known as: ABILIFY Take 5 mg by mouth daily.   Azathioprine 75 MG Tabs Take 150 mg by mouth 2 (two) times daily.   B-D UF III MINI PEN NEEDLES 31G X 5 MM Misc Generic drug: Insulin Pen Needle USE AS DIRECTED TWICE A DAY   Easy Touch Pen Needles 32G X 4 MM Misc Generic drug:  Insulin Pen Needle USE TO INJECT INSULIN TWICE DAILY   Benlysta 200 MG/ML Soaj Generic drug: Belimumab Inject 200 mg into the skin once a week. Thursdays   buPROPion 300 MG 24 hr tablet Commonly known as: WELLBUTRIN XL Take 300 mg by mouth daily.   carvedilol 25 MG tablet Commonly known as: COREG Take 1.5 tablets (37.5 mg total) by mouth 2 (two) times daily with a meal.   cromolyn 4 % ophthalmic solution Commonly known as: OPTICROM Place 1 drop into both eyes 2 (two) times daily.   cycloSPORINE 0.05 % ophthalmic emulsion Commonly known as: RESTASIS Place 1 drop into both eyes in the morning, at noon, and at bedtime.   diclofenac Sodium 1 % Gel Commonly known as: VOLTAREN APPLY 2 GRAMS TO AFFECTED AREA 4 TIMES A DAY What changed: See the new instructions.   Eliquis 5 MG Tabs tablet Generic drug: apixaban Take 1 tablet (5 mg total) by mouth 2 (two) times daily.   empagliflozin 10 MG Tabs tablet Commonly known as: Jardiance Take 1 tablet (10 mg total) by mouth daily with breakfast.   Entresto 97-103 MG Generic drug: sacubitril-valsartan TAKE 1 TABLET BY MOUTH TWICE A DAY   famotidine 20 MG tablet Commonly known as: PEPCID Take 20 mg by mouth 2 (two) times daily.   fluticasone 50 MCG/ACT nasal spray Commonly known as: FLONASE Place 2 sprays into both nostrils continuous as needed for allergies or rhinitis.   HumuLIN R U-500 KwikPen 500 UNIT/ML KwikPen Generic drug: insulin regular human CONCENTRATED 100 Units, 30 minutes before each meal   hydroxychloroquine 200 MG tablet Commonly known as: PLAQUENIL Take 200 mg by mouth 2 (two) times daily.   Insulin Syringe-Needle U-100 31G X 5/16" 1 ML Misc Commonly known as: Easy Touch Insulin Syringe USE AS DIRECTED THREE TIMES A DAY   isosorbide-hydrALAZINE 20-37.5 MG tablet Commonly known as: BiDil TAKE TWO (2) TABLETS BY MOUTH THREE TIMES DAILY What changed:  how much to take how to take this when to take  this additional instructions   levothyroxine 50 MCG tablet Commonly known as: SYNTHROID Take 1 tablet (50 mcg total) by mouth daily at 6 (six) AM.   megestrol 40 MG tablet Commonly known as: MEGACE Take 40 mg by mouth daily.   metFORMIN 500 MG 24 hr tablet Commonly known as: GLUCOPHAGE-XR Take 2 tablets (1,000 mg total) by mouth 2 (two) times daily.   multivitamin with minerals Tabs tablet Take 1 tablet by mouth daily.   ONE TOUCH ULTRA 2 w/Device Kit Check blood sugar daily   onetouch ultrasoft lancets Test blood sugar 4 times a day  oxybutynin 5 MG 24 hr tablet Commonly known as: DITROPAN-XL Take 5 mg by mouth daily.   pantoprazole 40 MG tablet Commonly known as: PROTONIX Take 1 tablet twice a day for 2 weeks and then once a day for 2 weeks   pregabalin 75 MG capsule Commonly known as: LYRICA Take 75 mg 3 (three) times daily by mouth.   prochlorperazine 10 MG tablet Commonly known as: COMPAZINE Take 1 tablet (10 mg total) by mouth every 6 (six) hours as needed for nausea or vomiting.   QUEtiapine 300 MG tablet Commonly known as: SEROQUEL Take 300 mg by mouth at bedtime.   rosuvastatin 20 MG tablet Commonly known as: CRESTOR Take 1 tablet (20 mg total) by mouth daily.   spironolactone 50 MG tablet Commonly known as: ALDACTONE TAKE 1 TABLET BY MOUTH EVERY DAY   sucralfate 1 GM/10ML suspension Commonly known as: CARAFATE Take 10 mLs by mouth 2 (two) times daily as needed (abdomen pain).   topiramate 25 MG tablet Commonly known as: TOPAMAX Take 75 mg by mouth at bedtime.   torsemide 20 MG tablet Commonly known as: DEMADEX Take 1 tablet (20 mg total) by mouth at bedtime.   Transderm-Scop 1 MG/3DAYS Generic drug: scopolamine Place 1 patch onto the skin every 3 (three) days. As needed for nausea        Allergies:  Allergies  Allergen Reactions   Metoclopramide Other (See Comments)    Developed restless leg, akathisia type limb movements.     Codeine Itching, Rash and Other (See Comments)   Hydrocodone Rash   Penicillins Itching    Tolerated Unasyn 02/01/2020   Percocet [Oxycodone-Acetaminophen] Hives and Rash    Tolerates dilaudid Tolerates acetaminophen     Past Medical History:  Diagnosis Date   Acute renal failure (HCC)     Intractable nausea vomiting secondary to diabetic gastroparesis causing dehydration and acute renal failure /notes 04/01/2013   Anginal pain ([place])    Anxiety    Bell's palsy 08/09/2010   Bicornuate uterus    CAP (community acquired pneumonia) 10/2011   [facility] End[facility] 11/08/2011   Chest pain 01/13/2014   CHF (congestive heart failure) ([place])    Diabetic gastroparesis associated with type 2 diabetes mellitus ([place])    this is presumed diagnoses, not confirmed by any studies.    Eczema    Family history of breast cancer    Family history of malignant neoplasm of breast    Family history of malignant neoplasm of ovary    Family history of ovarian cancer    Family history of prostate cancer    GERD (gastroesophageal reflux disease)    High cholesterol    Hypertension    Liver lesion 08/13/2019   Liver mass 2014   biopsied 03/2013 at [facility], not malignant.  is to undergo radiologic ablation of the mass in sept/October 2014.    Lupus ([place])    Migraines    "maybe a couple times/yr" (04/01/2013)   Obesity    Obstructive sleep apnea on CPAP 2011   Oxygen at nights   SJOGREN'S SYNDROME 08/09/2010   SLE (systemic lupus erythematosus) ([place])    [facility] Endo 12/01/2010    Past Surgical History:  Procedure Laterality Date   BREAST CYST EXCISION Left 08/2005   epidermoid   CARDIAC CATHETERIZATION  02/07/2012   ECTOPIC PREGNANCY SURGERY  1999   ESOPHAGOGASTRODUODENOSCOPY N/A 02/26/2013   Procedure: ESOPHAGOGASTRODUODENOSCOPY (EGD);  Surgeon: Irene Shipper, MD;  Location: [facility] ENDOSCOPY;  Service: Endoscopy;  Laterality:  N/A;   ESOPHAGOGASTRODUODENOSCOPY N/A 03/02/2015   Procedure: ESOPHAGOGASTRODUODENOSCOPY (EGD);   Surgeon: Milus Banister, MD;  Location: [place];  Service: Endoscopy;  Laterality: N/A;   ESOPHAGOGASTRODUODENOSCOPY N/A 02/23/2021   Procedure: ESOPHAGOGASTRODUODENOSCOPY (EGD);  Surgeon: Wilford Corner, MD;  Location: [place];  Service: Endoscopy;  Laterality: N/A;  possible dilation   HERNIA REPAIR     LEFT AND RIGHT HEART CATHETERIZATION WITH CORONARY ANGIOGRAM N/A 02/07/2012   Procedure: LEFT AND RIGHT HEART CATHETERIZATION WITH CORONARY ANGIOGRAM;  Surgeon: Laverda Page, MD;  Location: [facility] CATH LAB;  Service: Cardiovascular;  Laterality: N/A;   LIVER BIOPSY  03/2013   liver mass/medical hx noted above   MUSCLE BIOPSY     for lupus/notes 08/06/1094   UMBILICAL HERNIA REPAIR  58's    Family History  Problem Relation Age of Onset   Diabetes Mother    Asthma Mother    Urticaria Mother    Hypertension Mother    Stroke Mother    Ovarian cancer Sister 54       maternal half-sister; RAD51C positive   Asthma Brother    Asthma Brother    Breast cancer Maternal Aunt 60       currently 22   Cancer Maternal Aunt        unk. primary   Cancer Maternal Aunt        unk. primary   Ovarian cancer Maternal Aunt        deceased 48s   Breast cancer Maternal Aunt        deceased 73s   Breast cancer Maternal Aunt    Stomach cancer Maternal Uncle        dx 78s; deceased   Prostate cancer Maternal Uncle        deceased 22s   Breast cancer Cousin        deceased 54s; daughter of mat uncle with stomach ca   Hypertension Other    Stroke Other    Arthritis Other    Allergic rhinitis Neg Hx    Angioedema Neg Hx     Social History:  reports that she quit smoking about 31 years ago. Her smoking use included cigarettes. She has a 1.50 pack-year smoking history. She has never used smokeless tobacco. She reports that she does not drink alcohol and does not use drugs.  Review of Systems:  Last diabetic eye exam date unknown  Last foot exam date: 8/22  Symptoms of  neuropathy: Have occasional numbness in her feet  Hypertension:   Treatment includes Entresto  BP Readings from Last 3 Encounters:  05/25/22 122/88  05/14/22 123/74  04/27/22 (!) 144/90    Lipid management: Controlled with 20 mg of Crestor    Lab Results  Component Value Date   CHOL 93 02/21/2022   CHOL 203 (H) 05/22/2021   CHOL 160 03/06/2014   Lab Results  Component Value Date   HDL 37.00 (L) 02/21/2022   HDL 58 05/22/2021   HDL 57 03/06/2014   Lab Results  Component Value Date   LDLCALC 45 02/21/2022   LDLCALC 117 (H) 05/22/2021   LDLCALC 78 03/06/2014   Lab Results  Component Value Date   TRIG 55.0 02/21/2022   TRIG 138 05/22/2021   TRIG 125 03/06/2014   Lab Results  Component Value Date   CHOLHDL 3 02/21/2022   CHOLHDL 3.5 05/22/2021   CHOLHDL 2.8 03/06/2014   No results found for: "LDLDIRECT"  THYROID: She is on levothyroxine 50 mcg daily for  at least a couple of years with baseline TSH of 7.6 TSH is recently normal   Lab Results  Component Value Date   TSH 4.11 02/21/2022   TSH 2.871 05/21/2021   TSH 7.630 (H) 08/24/2019   FREET4 0.80 02/21/2022   History of CHF but echocardiogram in 2/23 showed normal functions    Examination:   BP 122/88   Pulse 99   Ht 5' 8"  (1.727 m)   Wt (!) 337 lb (152.9 kg)   LMP 03/24/2017   SpO2 99%   BMI 51.24 kg/m   Body mass index is 51.24 kg/m.    ASSESSMENT/ PLAN:    Diabetes type 2 with severe obesity and insulin-dependent:   Current regimen: Humulin R, 100 units 3 times a day, metformin and Jardiance 10 mg  See history of present illness for detailed discussion of current diabetes management, blood sugar patterns and problems identified  A1c is 7.4 and improved Previously fructosamine still relatively high at 364 about a month ago  Blood sugars are improved with Humulin R compared to NPH that she had been on before Also likely benefiting from Gas She had vomiting from [place] but  apparently was getting the 2 mg dose instead of 1 mg  Recommendations:   Will send prescription for freestyle libre 3 to the local pharmacy but also check on the supplier through [employer] website Needs more test trips for monitoring of blood sugars with meter if not able to get the sensor Will leave off Ozempic for now Increase Jardiance to 25 mg No change in insulin as yet Review blood sugar patterns again in about 2 months Consider follow-up with diabetes educator   There are no Patient Instructions on file for this visit.   Elayne Snare 05/25/2022, 10:01 AM

## 2022-05-26 ENCOUNTER — Other Ambulatory Visit: Payer: Self-pay

## 2022-05-26 DIAGNOSIS — E1165 Type 2 diabetes mellitus with hyperglycemia: Secondary | ICD-10-CM

## 2022-06-06 ENCOUNTER — Telehealth: Payer: Self-pay

## 2022-06-06 ENCOUNTER — Telehealth: Payer: Self-pay | Admitting: Endocrinology

## 2022-06-06 ENCOUNTER — Other Ambulatory Visit: Payer: Self-pay | Admitting: Endocrinology

## 2022-06-06 DIAGNOSIS — E1165 Type 2 diabetes mellitus with hyperglycemia: Secondary | ICD-10-CM

## 2022-06-06 DIAGNOSIS — Z794 Long term (current) use of insulin: Secondary | ICD-10-CM

## 2022-06-06 MED ORDER — HUMULIN R U-500 KWIKPEN 500 UNIT/ML ~~LOC~~ SOPN: PEN_INJECTOR | SUBCUTANEOUS | 0 refills | Status: DC

## 2022-06-06 NOTE — Telephone Encounter (Signed)
I do not see where patient is on Humulin N. Per patient you told her it was ok for her to take since she is not taking the ozempic. Please advise. If so, what are the instructions for the medication.

## 2022-06-06 NOTE — Telephone Encounter (Signed)
RX now sent to preferred pharmacy.  

## 2022-06-06 NOTE — Telephone Encounter (Signed)
MEDICATION: Humulin N Kwikpen  PHARMACY:    [id number] [place], [place] - [address] (Ph: [phone]   HAS THE PATIENT CONTACTED THEIR PHARMACY?  Yes  IS THIS A 90 DAY SUPPLY : Yes  IS PATIENT OUT OF MEDICATION: Yes  IF NOT; HOW MUCH IS LEFT:   LAST APPOINTMENT DATE: '@10'$ /25/2023  NEXT APPOINTMENT DATE:'@2'$ /01/2023  DO WE HAVE YOUR PERMISSION TO LEAVE A DETAILED MESSAGE?:Yes  OTHER COMMENTS:    **Let patient know to contact pharmacy at the end of the day to make sure medication is ready. **  ** Please notify patient to allow 48-72 hours to process**  **Encourage patient to contact the pharmacy for refills or they can [facility]*

## 2022-06-07 NOTE — Telephone Encounter (Signed)
Patient called back upset. States that at last appt you all discussed that she could go back on her old regiment Humulin N and Victoza. States she has not been taking Humulin R and doesn't want to

## 2022-06-08 NOTE — Telephone Encounter (Signed)
error 

## 2022-06-17 ENCOUNTER — Other Ambulatory Visit: Payer: Self-pay

## 2022-06-17 DIAGNOSIS — Z794 Long term (current) use of insulin: Secondary | ICD-10-CM

## 2022-06-17 MED ORDER — ACCU-CHEK AVIVA PLUS VI STRP: ORAL_STRIP | 2 refills | Status: DC

## 2022-06-22 ENCOUNTER — Other Ambulatory Visit: Payer: Medicaid Other

## 2022-06-22 ENCOUNTER — Ambulatory Visit: Payer: Medicaid Other | Admitting: Hematology and Oncology

## 2022-07-29 ENCOUNTER — Other Ambulatory Visit: Payer: Self-pay | Admitting: Endocrinology

## 2022-08-15 ENCOUNTER — Other Ambulatory Visit: Payer: Self-pay | Admitting: Endocrinology

## 2022-08-15 DIAGNOSIS — E1165 Type 2 diabetes mellitus with hyperglycemia: Secondary | ICD-10-CM

## 2022-08-16 ENCOUNTER — Other Ambulatory Visit: Payer: Self-pay | Admitting: Endocrinology

## 2022-08-16 DIAGNOSIS — E1165 Type 2 diabetes mellitus with hyperglycemia: Secondary | ICD-10-CM

## 2022-08-31 ENCOUNTER — Other Ambulatory Visit: Payer: Medicaid Other

## 2022-09-07 ENCOUNTER — Ambulatory Visit: Payer: Medicaid Other | Admitting: Endocrinology

## 2022-09-12 ENCOUNTER — Other Ambulatory Visit: Payer: Self-pay | Admitting: Endocrinology

## 2022-09-12 DIAGNOSIS — E1143 Type 2 diabetes mellitus with diabetic autonomic (poly)neuropathy: Secondary | ICD-10-CM

## 2022-09-21 ENCOUNTER — Ambulatory Visit: Payer: Medicaid Other | Admitting: Cardiology

## 2022-09-21 ENCOUNTER — Encounter: Payer: Self-pay | Admitting: Cardiology

## 2022-09-21 VITALS — BP 124/80 | HR 87 | Resp 16 | Ht 68.0 in | Wt 321.0 lb

## 2022-09-21 DIAGNOSIS — I428 Other cardiomyopathies: Secondary | ICD-10-CM

## 2022-09-21 DIAGNOSIS — I5032 Chronic diastolic (congestive) heart failure: Secondary | ICD-10-CM

## 2022-09-21 DIAGNOSIS — Z86711 Personal history of pulmonary embolism: Secondary | ICD-10-CM

## 2022-09-21 DIAGNOSIS — I10 Essential (primary) hypertension: Secondary | ICD-10-CM

## 2022-09-21 MED ORDER — APIXABAN 2.5 MG PO TABS: 2.5000 mg | ORAL_TABLET | Freq: Two times a day (BID) | ORAL | 3 refills | Status: DC

## 2022-09-21 NOTE — Progress Notes (Signed)
Primary Physician:  Bartholome Bill, MD   Patient ID: [name], female    DOB: Jun 21, 1971, 52 y.o.   MRN: [id number]  CC:  Chief Complaint  Patient presents with   Chronic diastolic congestive heart failure   Hypertension   Follow-up    6 month   Subjective:   HPI: [name] is a 52 y.o.female  with Sjogren's syndrome, SLE, nonischemic cardiomyopathy, severe LVEF in 2018, and again in 2021, with intermittent improvement in LVEF by echo, last echocardiogram in February 2023 revealing preserved LVEF,  morbid obesity, obesity hypoventilation syndrome, uncontrolled type 2 DM, hypertension, h/o CVA in 2014, OSA on CPAP and hepatic  adenoma s/p embolization on 08/12/19.  She also developed bilateral pulmonary emboli within the proximal segment pulmonary arterioles with severe clot burden and evidence of RV strain consistent with submassive PE in September 2022 when she was admitted with pneumonia and is on chronic anticoagulation with Eliquis.  She presents for annual visit.  Dyspnea has remained stable.  No further leg edema, PND or orthopnea.  She has not had any further hospitalizations.  She is presently on long-term anticoagulation with Eliquis.  She has lost about 20 to 25 pounds in weight since I last saw her 6 months ago.  Past Medical History:  Diagnosis Date   Acute renal failure (HCC)     Intractable nausea vomiting secondary to diabetic gastroparesis causing dehydration and acute renal failure /notes 04/01/2013   Anginal pain ([place])    Anxiety    Bell's palsy 08/09/2010   Bicornuate uterus    CAP (community acquired pneumonia) 10/2011   Archie Endo 11/08/2011   Chest pain 01/13/2014   CHF (congestive heart failure) ([place])    Diabetic gastroparesis associated with type 2 diabetes mellitus ([place])    this is presumed diagnoses, not confirmed by any studies.    Eczema    Family history of breast cancer    Family history of malignant neoplasm of breast    Family history of  malignant neoplasm of ovary    Family history of ovarian cancer    Family history of prostate cancer    GERD (gastroesophageal reflux disease)    High cholesterol    Hypertension    Liver lesion 08/13/2019   Liver mass 2014   biopsied 03/2013 at [facility], not malignant.  is to undergo radiologic ablation of the mass in sept/October 2014.    Lupus ([place])    Migraines    "maybe a couple times/yr" (04/01/2013)   Obesity    Obstructive sleep apnea on CPAP 2011   Oxygen at nights   SJOGREN'S SYNDROME 08/09/2010   SLE (systemic lupus erythematosus) ([place])    Archie Endo 12/01/2010    Social History   Tobacco Use   Smoking status: Former    Packs/day: 0.50    Years: 3.00    Total pack years: 1.50    Types: Cigarettes    Quit date: 06/02/1991    Years since quitting: 31.3   Smokeless tobacco: Never   Tobacco comments:    quit 28 yrs ago  Substance Use Topics   Alcohol use: No    Alcohol/week: 0.0 standard drinks of alcohol   Marital Status: Single   Review of Systems  Cardiovascular:  Positive for dyspnea on exertion. Negative for chest pain, leg swelling, orthopnea, palpitations and paroxysmal nocturnal dyspnea.  Musculoskeletal:  Positive for arthritis and back pain.  Gastrointestinal:  Negative for melena.   Objective:  Blood  pressure 124/80, pulse 87, resp. rate 16, height 5' 8"$  (1.727 m), weight (!) 321 lb (145.6 kg), last menstrual period 03/24/2017, SpO2 98 %. Body mass index is 48.81 kg/m.      09/21/2022    2:42 PM 09/21/2022    1:43 PM 05/25/2022    9:36 AM  Vitals with BMI  Height  5' 8"$  5' 8"$   Weight  321 lbs 337 lbs  BMI  [id number]  Systolic [id number]  Diastolic 80 88 88  Pulse  87 99   Physical Exam Constitutional:      Appearance: She is well-developed.     Comments: Morbidly obese  Neck:     Vascular: No carotid bruit or JVD.     Comments: Short neck and difficult to evaluate JVP Cardiovascular:     Rate and Rhythm: Normal rate and regular  rhythm.     Pulses: Intact distal pulses.          Carotid pulses are 2+ on the right side and 2+ on the left side.    Heart sounds: Normal heart sounds. No murmur heard.    No gallop.  Pulmonary:     Effort: Pulmonary effort is normal.     Breath sounds: Normal breath sounds.  Abdominal:     General: Bowel sounds are normal.     Palpations: Abdomen is soft.     Comments: Obese. Pannus present  Musculoskeletal:     Right lower leg: No edema.     Left lower leg: No edema.    Laboratory examination:      Latest Ref Rng & Units 05/23/2022   10:35 AM 05/13/2022    6:53 AM 05/12/2022    2:32 AM  CMP  Glucose 70 - 99 mg/dL 155  104  106   BUN 6 - 23 mg/dL 10  10  12   $ Creatinine 0.40 - 1.20 mg/dL 0.83  0.85  0.90   Sodium 135 - 145 mEq/L 141  141  143   Potassium 3.5 - 5.1 mEq/L 3.7  3.7  3.3   Chloride 96 - 112 mEq/L 113  115  113   CO2 19 - 32 mEq/L 22  21  21   $ Calcium 8.4 - 10.5 mg/dL 8.9  8.7  8.9   Total Protein 6.5 - 8.1 g/dL   5.6   Total Bilirubin 0.3 - 1.2 mg/dL   0.8   Alkaline Phos 38 - 126 U/L   60   AST 15 - 41 U/L   13   ALT 0 - 44 U/L   10       Latest Ref Rng & Units 05/10/2022   12:08 AM 05/09/2022    2:32 PM 02/25/2022    1:44 AM  CBC  WBC 4.0 - 10.5 K/uL 8.8  9.0  8.9   Hemoglobin 12.0 - 15.0 g/dL 11.4  12.0  10.0   Hematocrit 36.0 - 46.0 % 35.2  37.1  31.1   Platelets 150 - 400 K/uL 297  313  238    Lipid Panel     Component Value Date/Time   CHOL 93 02/21/2022 1104   TRIG 55.0 02/21/2022 1104   HDL 37.00 (L) 02/21/2022 1104   CHOLHDL 3 02/21/2022 1104   VLDL 11.0 02/21/2022 1104   LDLCALC 45 02/21/2022 1104     HEMOGLOBIN A1C Lab Results  Component Value Date   HGBA1C 7.4 (H) 05/23/2022   MPG 323.53 05/22/2021   TSH Recent  Labs    02/21/22 1104  TSH 4.11     BNP (last 3 results) Recent Labs    02/25/22 0144  BNP 14.5     External labs:   Labs 08/02/2022:  Total cholesterol 105, triglycerides 79, HDL 43, LDL 46.  Hb  10.9/HCT 35.2, platelets 272.  CBC stable since December 2021.  Sodium 141, potassium 3.6, BUN 11, creatinine 0.99, EGFR 69 mL, LFTs normal.  TSH normal at 1.98.  Vitamin D is severely reduced at 9.    Medications and allergies   Allergies  Allergen Reactions   Metoclopramide Other (See Comments)    Developed restless leg, akathisia type limb movements.    Codeine Itching, Rash and Other (See Comments)   Hydrocodone Rash   Penicillins Itching    Tolerated Unasyn 02/01/2020   Percocet [Oxycodone-Acetaminophen] Hives and Rash    Tolerates dilaudid Tolerates acetaminophen    Medications after present encounter:  Current Outpatient Medications:    ADVAIR HFA 230-21 MCG/ACT inhaler, Inhale 1-2 puffs into the lungs 2 (two) times daily., Disp: , Rfl:    albuterol (PROVENTIL) (2.5 MG/3ML) 0.083% nebulizer solution, Take 2.5 mg by nebulization every 6 (six) hours as needed for wheezing., Disp: , Rfl:    ALPRAZolam (XANAX) 0.5 MG tablet, Take 0.5 mg 2 (two) times daily as needed by mouth for anxiety or sleep. , Disp: , Rfl:    amLODipine (NORVASC) 10 MG tablet, Take 10 mg by mouth daily., Disp: , Rfl:    apixaban (ELIQUIS) 2.5 MG TABS tablet, Take 1 tablet (2.5 mg total) by mouth 2 (two) times daily., Disp: 180 tablet, Rfl: 3   Azathioprine 75 MG TABS, Take 150 mg by mouth 2 (two) times daily., Disp: , Rfl:    B-D UF III MINI PEN NEEDLES 31G X 5 MM MISC, USE AS DIRECTED TWICE A DAY, Disp: 100 each, Rfl: 2   Belimumab (BENLYSTA) 200 MG/ML SOAJ, Inject 200 mg into the skin once a week. Thursdays, Disp: , Rfl:    Blood Glucose Monitoring Suppl (ONE TOUCH ULTRA 2) w/Device KIT, Check blood sugar daily, Disp: 1 kit, Rfl: 0   buPROPion (WELLBUTRIN XL) 300 MG 24 hr tablet, Take 300 mg by mouth daily., Disp: , Rfl:    carvedilol (COREG) 25 MG tablet, Take 1.5 tablets (37.5 mg total) by mouth 2 (two) times daily with a meal., Disp: 270 tablet, Rfl: 3   Continuous Blood Gluc Sensor (FREESTYLE LIBRE 3  SENSOR) MISC, 1 Device by Does not apply route every 14 (fourteen) days. Apply 1 sensor on upper arm every 14 days for continuous glucose monitoring, Disp: 2 each, Rfl: 2   cromolyn (OPTICROM) 4 % ophthalmic solution, Place 1 drop into both eyes 2 (two) times daily., Disp: , Rfl:    cycloSPORINE (RESTASIS) 0.05 % ophthalmic emulsion, Place 1 drop into both eyes in the morning, at noon, and at bedtime. , Disp: , Rfl:    diclofenac Sodium (VOLTAREN) 1 % GEL, APPLY 2 GRAMS TO AFFECTED AREA 4 TIMES A DAY (Patient taking differently: Apply 2 g topically 4 (four) times daily as needed (pain).), Disp: 400 g, Rfl: 1   EASY TOUCH PEN NEEDLES 32G X 4 MM MISC, USE TO INJECT INSULIN TWICE DAILY, Disp: 100 each, Rfl: 9   ENTRESTO 97-103 MG, TAKE 1 TABLET BY MOUTH TWICE A DAY, Disp: 60 tablet, Rfl: 11   famotidine (PEPCID) 20 MG tablet, Take 20 mg by mouth 2 (two) times daily., Disp: , Rfl:  fluticasone (FLONASE) 50 MCG/ACT nasal spray, Place 2 sprays into both nostrils continuous as needed for allergies or rhinitis., Disp: , Rfl:    glucose blood (ACCU-CHEK GUIDE) test strip, 1 each by Other route 2 (two) times daily. And lancets 2/day, Disp: 200 each, Rfl: 3   glucose blood (ACCU-CHEK GUIDE) test strip, USE TO TEST BLOOD SUGAR 4 TIMES DAILY, Disp: 200 strip, Rfl: 0   glucose blood (ONETOUCH ULTRA) test strip, Test blood sugar 4 times a day, Disp: 100 each, Rfl: 12   hydroxychloroquine (PLAQUENIL) 200 MG tablet, Take 200 mg by mouth 2 (two) times daily., Disp: , Rfl:    insulin regular human CONCENTRATED (HUMULIN R U-500 KWIKPEN) 500 UNIT/ML KwikPen, 100 UNITS, 30 MINUTES BEFORE EACH MEAL, Disp: 6 mL, Rfl: 1   Insulin Syringe-Needle U-100 (EASY TOUCH INSULIN SYRINGE) 31G X 5/16" 1 ML MISC, USE AS DIRECTED THREE TIMES A DAY, Disp: 100 each, Rfl: 10   isosorbide-hydrALAZINE (BIDIL) 20-37.5 MG tablet, TAKE TWO (2) TABLETS BY MOUTH THREE TIMES DAILY (Patient taking differently: Take 2 tablets by mouth 3 (three) times  daily.), Disp: 270 tablet, Rfl: 3   JARDIANCE 10 MG TABS tablet, TAKE 1 TABLET (10 MG TOTAL) BY MOUTH DAILY WITH BREAKFAST., Disp: 30 tablet, Rfl: 3   Lancets (ONETOUCH ULTRASOFT) lancets, Test blood sugar 4 times a day, Disp: 100 each, Rfl: 12   levothyroxine (SYNTHROID) 50 MCG tablet, Take 1 tablet (50 mcg total) by mouth daily at 6 (six) AM., Disp:  , Rfl:    megestrol (MEGACE) 40 MG tablet, Take 40 mg by mouth daily., Disp: , Rfl: 3   metFORMIN (GLUCOPHAGE-XR) 500 MG 24 hr tablet, Take 2 tablets (1,000 mg total) by mouth 2 (two) times daily., Disp: 180 tablet, Rfl: 3   Multiple Vitamin (MULTIVITAMIN WITH MINERALS) TABS tablet, Take 1 tablet by mouth daily., Disp:  , Rfl:    oxybutynin (DITROPAN-XL) 5 MG 24 hr tablet, Take 5 mg by mouth daily., Disp: , Rfl:    pregabalin (LYRICA) 75 MG capsule, Take 75 mg 3 (three) times daily by mouth. , Disp: , Rfl:    prochlorperazine (COMPAZINE) 10 MG tablet, Take 1 tablet (10 mg total) by mouth every 6 (six) hours as needed for nausea or vomiting., Disp: 30 tablet, Rfl: 0   QUEtiapine (SEROQUEL) 300 MG tablet, Take 300 mg by mouth at bedtime., Disp: , Rfl:    rosuvastatin (CRESTOR) 20 MG tablet, Take 1 tablet (20 mg total) by mouth daily., Disp: 90 tablet, Rfl: 3   Semaglutide, 2 MG/DOSE, 8 MG/3ML SOPN, Inject 2 mg as directed once a week., Disp: 3 mL, Rfl: 1   spironolactone (ALDACTONE) 50 MG tablet, TAKE 1 TABLET BY MOUTH EVERY DAY (Patient taking differently: Take 50 mg by mouth daily.), Disp: 90 tablet, Rfl: 3   sucralfate (CARAFATE) 1 GM/10ML suspension, Take 10 mLs by mouth 2 (two) times daily as needed (abdomen pain)., Disp: , Rfl:    topiramate (TOPAMAX) 25 MG tablet, Take 75 mg by mouth at bedtime., Disp: , Rfl:    torsemide (DEMADEX) 20 MG tablet, Take 1 tablet (20 mg total) by mouth at bedtime., Disp: 90 tablet, Rfl: 3   TRANSDERM-SCOP 1 MG/3DAYS, Place 1 patch onto the skin every 3 (three) days. As needed for nausea, Disp: , Rfl:    VICTOZA 18  MG/3ML SOPN, Inject 0.6 mg into the skin., Disp: , Rfl:    Vitamin D, Ergocalciferol, (DRISDOL) 1.25 MG (50000 UNIT) CAPS capsule, Take 50,000  Units by mouth once a week., Disp: , Rfl:     Radiology:    CXR 02/01/2020: New patchy bilateral airspace disease most consistent with pneumonia. Chronic cardiomegaly and vascular congestion.  CT angiogram chest 05/21/2021: 1. Bilateral occlusive acute pulmonary emboli within the proximal segmental pulmonary arteries. Overall clot burden is severe. Evidence of RIGHT ventricular strain (RV/LV Ratio = 1.15) consistent with at least submassive (intermediate risk) PE. The presence of right heart strain has been associated with an increased risk of morbidity and mortality.  2. No pulmonary infarction.  Cardiac Studies:   Coronary angiogram 02/07/2012: Normal coronary arteries and LVEF. Left dominant circulation.  Carotid duplex 03/06/2014: The vertebral arteries appear patent with antegrade flow. Findings consistent with 1-39 percent stenosis involving the right internal carotid artery and the left internal carotid artery.  Vascular Ultrasound Lower Extremity Venous 03/26/2018: Right: There is no evidence of deep vein thrombosis in the lower extremity. No cystic structure found in the popliteal fossa. Left: No evidence of common femoral vein obstruction.  PCV ECHOCARDIOGRAM COMPLETE 09/09/2021  Narrative Echocardiogram 09/09/2021: Normal LV systolic function with visual EF 55-60%. Left ventricle cavity is normal in size. Normal left ventricular wall thickness. Normal global wall motion. Normal diastolic filling pattern, normal LAP. Mild tricuspid regurgitation. No evidence of pulmonary hypertension. Compared to study 05/21/2021 LVEF improved from 30-35% to 55-60%, G1 DD is now normal, RV systolic function has improved, otherwise no significant change.  EKG   EKG 09/21/2022: Normal sinus rhythm at rate of 98 bpm, normal axis, nonspecific T wave  flattening.  Low-voltage complexes, consider pulmonary disease pattern.  Compared to 03/21/2022, no change.  Assessment:     ICD-10-CM   1. Chronic diastolic congestive heart failure (HCC)  I50.32 EKG 12-Lead    2. Non-ischemic cardiomyopathy ([place])  I42.8     3. Essential hypertension  I10     4. Class 3 severe obesity due to excess calories with serious comorbidity and body mass index (BMI) of 45.0 to 49.9 in adult (HCC)  E66.01    Z68.42     5. History of pulmonary embolism  Z86.711 apixaban (ELIQUIS) 2.5 MG TABS tablet      Meds ordered this encounter  Medications   apixaban (ELIQUIS) 2.5 MG TABS tablet    Sig: Take 1 tablet (2.5 mg total) by mouth 2 (two) times daily.    Dispense:  180 tablet    Refill:  3   Medications Discontinued During This Encounter  Medication Reason   pantoprazole (PROTONIX) 40 MG tablet Patient Preference   empagliflozin (JARDIANCE) 25 MG TABS tablet Patient Preference   ARIPiprazole (ABILIFY) 5 MG tablet Patient Preference   apixaban (ELIQUIS) 5 MG TABS tablet Completed Course   Recommendations:   [name]  is a 52 y.o. female  with Sjogren's syndrome, SLE, nonischemic cardiomyopathy, severe LVEF in 2018, and again in 2021, with intermittent improvement in LVEF by echo, last echocardiogram in February 2023 revealing preserved LVEF,  morbid obesity, obesity hypoventilation syndrome, uncontrolled type 2 DM, hypertension, h/o CVA in 2014, OSA on CPAP and hepatic  adenoma s/p embolization on 08/12/19.  She also developed bilateral pulmonary emboli within the proximal segment pulmonary arterioles with severe clot burden and evidence of RV strain consistent with submassive PE in September 2022 when she was admitted with pneumonia and is on chronic anticoagulation with Eliquis.  This is annual visit.   1. Chronic diastolic congestive heart failure [facility]) Patient is doing extremely well, she has lost  about 20 pounds in weight since her last saw her about  4 months ago.  She has been very strict with her diet.  I congratulated her.  Today there is no clinical evidence of heart failure.  2. Non-ischemic cardiomyopathy ([place]) She had severe LV systolic dysfunction in [id number] but fortunately EF has improved on medical therapy to normal.  Continue Aldactone, BiDil, Entresto and carvedilol.  She is also on amlodipine for hypertension control, blood pressure under excellent control.  Hence I did not make any changes to her medications.  3. Essential hypertension Blood pressure Controlled, reviewed her external labs.  Renal function is normal.  4. Class 3 severe obesity due to excess calories with serious comorbidity and body mass index (BMI) of 45.0 to 49.9 in adult [facility]) She admits to current lifestyle changes in fact her BMI is reduced from >50 to the present 48.  I congratulated her.  5. History of pulmonary embolism Patient presented with acute pulmonary embolism submassive, in September 2022, hypercoagulable workup was negative.  However as it was unprovoked and she has lupus, she was recommended long-term anticoagulation by pulmonary medicine.  I reviewed her external labs, she continues to be anemic on a chronic basis, as it was first episode of PE which is unprovoked, we could certainly take a chance on reducing the dose of the Eliquis to DVT prophylaxis dose of 2.5 mg twice daily and hopefully this will help with stabilization of anemia as well.  This was a complex visit making complex decisions including evaluation of prior records, changes made today could be life altering.  Hopefully her anemia will improve with this change I made today.  She will continue to follow closely with her PCP.  She has prior history of stroke, could also consider completely discontinuing Eliquis and switching her to Plavix and see how she does.  I will do this only if she continues to have profound anemia.   Adrian Prows, MD, [facility] 09/21/2022, 2:57 PM Office: [phone]

## 2022-10-12 ENCOUNTER — Other Ambulatory Visit: Payer: Self-pay | Admitting: Endocrinology

## 2022-10-13 ENCOUNTER — Emergency Department (HOSPITAL_COMMUNITY)
Admission: EM | Admit: 2022-10-13 | Discharge: 2022-10-14 | Disposition: A | Discharge status: Home / Self Care | Payer: Medicaid Other | Attending: Emergency Medicine | Admitting: Emergency Medicine

## 2022-10-13 ENCOUNTER — Encounter (HOSPITAL_COMMUNITY): Payer: Self-pay

## 2022-10-13 ENCOUNTER — Other Ambulatory Visit: Payer: Self-pay

## 2022-10-13 DIAGNOSIS — Z79899 Other long term (current) drug therapy: Secondary | ICD-10-CM | POA: Insufficient documentation

## 2022-10-13 DIAGNOSIS — R0602 Shortness of breath: Secondary | ICD-10-CM | POA: Insufficient documentation

## 2022-10-13 DIAGNOSIS — Z7901 Long term (current) use of anticoagulants: Secondary | ICD-10-CM | POA: Diagnosis not present

## 2022-10-13 DIAGNOSIS — D72829 Elevated white blood cell count, unspecified: Secondary | ICD-10-CM | POA: Insufficient documentation

## 2022-10-13 DIAGNOSIS — R1084 Generalized abdominal pain: Secondary | ICD-10-CM | POA: Diagnosis not present

## 2022-10-13 DIAGNOSIS — I509 Heart failure, unspecified: Secondary | ICD-10-CM | POA: Insufficient documentation

## 2022-10-13 DIAGNOSIS — Z794 Long term (current) use of insulin: Secondary | ICD-10-CM | POA: Insufficient documentation

## 2022-10-13 DIAGNOSIS — R079 Chest pain, unspecified: Secondary | ICD-10-CM | POA: Diagnosis not present

## 2022-10-13 DIAGNOSIS — R112 Nausea with vomiting, unspecified: Secondary | ICD-10-CM | POA: Diagnosis present

## 2022-10-13 DIAGNOSIS — R000 Tachycardia, unspecified: Secondary | ICD-10-CM | POA: Insufficient documentation

## 2022-10-13 DIAGNOSIS — R1033 Periumbilical pain: Secondary | ICD-10-CM | POA: Insufficient documentation

## 2022-10-13 NOTE — ED Triage Notes (Signed)
Arrives EMS from home with c/o substernal chest discomfort beginning yesterday. Also admits periumbilical abdominal pain, nausea and ~6 episodes of vomiting today. Has been unable to take any of her night time medications.   Hx of CHF and gets out of breath when walking distances.   EMS admin '324mg'$  ASA pta.

## 2022-10-14 ENCOUNTER — Emergency Department (HOSPITAL_COMMUNITY): Payer: Medicaid Other

## 2022-10-14 ENCOUNTER — Emergency Department (HOSPITAL_COMMUNITY)
Admission: EM | Admit: 2022-10-14 | Discharge: 2022-10-15 | Disposition: A | Discharge status: Home / Self Care | Payer: Medicaid Other | Source: Home / Self Care | Attending: Emergency Medicine | Admitting: Emergency Medicine

## 2022-10-14 ENCOUNTER — Encounter (HOSPITAL_COMMUNITY): Payer: Self-pay

## 2022-10-14 ENCOUNTER — Other Ambulatory Visit: Payer: Self-pay

## 2022-10-14 DIAGNOSIS — R1114 Bilious vomiting: Secondary | ICD-10-CM

## 2022-10-14 DIAGNOSIS — R112 Nausea with vomiting, unspecified: Secondary | ICD-10-CM | POA: Insufficient documentation

## 2022-10-14 DIAGNOSIS — I509 Heart failure, unspecified: Secondary | ICD-10-CM | POA: Insufficient documentation

## 2022-10-14 DIAGNOSIS — Z79899 Other long term (current) drug therapy: Secondary | ICD-10-CM | POA: Insufficient documentation

## 2022-10-14 DIAGNOSIS — R0602 Shortness of breath: Secondary | ICD-10-CM | POA: Insufficient documentation

## 2022-10-14 DIAGNOSIS — Z794 Long term (current) use of insulin: Secondary | ICD-10-CM | POA: Insufficient documentation

## 2022-10-14 LAB — CBC
HCT: 37.5 % (ref 36.0–46.0)
Hemoglobin: 12 g/dL (ref 12.0–15.0)
MCH: 28.8 pg (ref 26.0–34.0)
MCHC: 32 g/dL (ref 30.0–36.0)
MCV: 90.1 fL (ref 80.0–100.0)
Platelets: 294 10*3/uL (ref 150–400)
RBC: 4.16 MIL/uL (ref 3.87–5.11)
RDW: 14.5 % (ref 11.5–15.5)
WBC: 15.9 10*3/uL — ABNORMAL HIGH (ref 4.0–10.5)
nRBC: 0 % (ref 0.0–0.2)

## 2022-10-14 LAB — COMPREHENSIVE METABOLIC PANEL
ALT: 10 U/L (ref 0–44)
AST: 15 U/L (ref 15–41)
Albumin: 3.7 g/dL (ref 3.5–5.0)
Alkaline Phosphatase: 101 U/L (ref 38–126)
Anion gap: 13 (ref 5–15)
BUN: 15 mg/dL (ref 6–20)
CO2: 19 mmol/L — ABNORMAL LOW (ref 22–32)
Calcium: 9.7 mg/dL (ref 8.9–10.3)
Chloride: 108 mmol/L (ref 98–111)
Creatinine, Ser: 1.05 mg/dL — ABNORMAL HIGH (ref 0.44–1.00)
GFR, Estimated: >60 mL/min (ref >60)
Glucose, Bld: 212 mg/dL — ABNORMAL HIGH (ref 70–99)
Potassium: 3.9 mmol/L (ref 3.5–5.1)
Sodium: 140 mmol/L (ref 135–145)
Total Bilirubin: 1.1 mg/dL (ref 0.3–1.2)
Total Protein: 7.2 g/dL (ref 6.5–8.1)

## 2022-10-14 LAB — LIPASE, BLOOD: Lipase: 29 U/L (ref 11–51)

## 2022-10-14 LAB — I-STAT BETA HCG BLOOD, ED (MC, WL, AP ONLY): I-stat hCG, quantitative: <5 m[IU]/mL (ref <5)

## 2022-10-14 LAB — TROPONIN I (HIGH SENSITIVITY)
Troponin I (High Sensitivity): 7 ng/L (ref <18)
Troponin I (High Sensitivity): 8 ng/L (ref <18)

## 2022-10-14 LAB — BRAIN NATRIURETIC PEPTIDE: B Natriuretic Peptide: 102.4 pg/mL — ABNORMAL HIGH (ref 0.0–100.0)

## 2022-10-14 MED ORDER — PROMETHAZINE HCL 25 MG PO TABS: 25.0000 mg | ORAL_TABLET | Freq: Three times a day (TID) | ORAL | 0 refills | Status: DC | PRN

## 2022-10-14 MED ORDER — ONDANSETRON HCL 4 MG/2ML IJ SOLN
4.0000 mg | Freq: Once | INTRAMUSCULAR | Status: AC
  Administered 2022-10-14: 4 mg via INTRAVENOUS
  Filled 2022-10-14: qty 2

## 2022-10-14 MED ORDER — IBUPROFEN 600 MG PO TABS: 600.0000 mg | ORAL_TABLET | Freq: Four times a day (QID) | ORAL | 0 refills | Status: DC | PRN

## 2022-10-14 MED ORDER — ONDANSETRON 4 MG PO TBDP: 4.0000 mg | ORAL_TABLET | Freq: Three times a day (TID) | ORAL | 0 refills | Status: DC | PRN

## 2022-10-14 MED ORDER — HYDROMORPHONE HCL 1 MG/ML IJ SOLN
1.0000 mg | Freq: Once | INTRAMUSCULAR | Status: AC
  Administered 2022-10-14: 1 mg via INTRAVENOUS
  Filled 2022-10-14: qty 1

## 2022-10-14 MED ORDER — DROPERIDOL 2.5 MG/ML IJ SOLN
1.2500 mg | Freq: Once | INTRAMUSCULAR | Status: AC
  Administered 2022-10-14: 1.25 mg via INTRAVENOUS
  Filled 2022-10-14: qty 2

## 2022-10-14 MED ORDER — IOHEXOL 350 MG/ML SOLN
100.0000 mL | Freq: Once | INTRAVENOUS | Status: AC | PRN
  Administered 2022-10-14: 100 mL via INTRAVENOUS

## 2022-10-14 MED ORDER — HYDROMORPHONE HCL 1 MG/ML IJ SOLN
0.5000 mg | Freq: Once | INTRAMUSCULAR | Status: AC
  Administered 2022-10-14: 0.5 mg via INTRAVENOUS
  Filled 2022-10-14: qty 1

## 2022-10-14 MED ORDER — LACTATED RINGERS IV BOLUS
1000.0000 mL | Freq: Once | INTRAVENOUS | Status: AC
  Administered 2022-10-14: 1000 mL via INTRAVENOUS

## 2022-10-14 MED ORDER — PROMETHAZINE HCL 25 MG PO TABS
25.0000 mg | ORAL_TABLET | ORAL | Status: AC
  Administered 2022-10-14: 25 mg via ORAL
  Filled 2022-10-14: qty 1

## 2022-10-14 NOTE — ED Triage Notes (Signed)
Pt present to ED with c/o shortness of breath, onset today. Pt seen in ED last night for n/v, discharged to home and states phenergan ineffective. Pt c/o abdominal pain and chest pain when vomiting.

## 2022-10-14 NOTE — Discharge Instructions (Signed)
Return to the ER if your symptoms change or worsen.  If this is viral, the vomiting might last another day or two and you might get some diarrhea.   If you have high fever, worsening pain, bloody vomit or bloody stool, return to the ER.

## 2022-10-14 NOTE — Discharge Instructions (Signed)
As discussed, your evaluation today has been largely reassuring.  But, it is important that you monitor your condition carefully, and do not hesitate to return to the ED if you develop new, or concerning changes in your condition. ? ?Otherwise, please follow-up with your physician for appropriate ongoing care. ? ?

## 2022-10-14 NOTE — ED Provider Notes (Signed)
[facility] Emergency Department Provider Note MRN:  [id number]  Arrival date & time: 10/14/22     Chief Complaint   Abdominal Pain and Chest Pain   History of Present Illness   [name] is a 52 y.o. year-old female presents to the ED with chief complaint of abdominal pain, nausea, and vomiting.  States she has been having the symptoms for the past couple of days.  States that it also causes her chest to hurt.  She has history of CHF and is also on Eliquis for prior PE.  She denies having any diarrhea.  Denies any known sick contacts.  She denies prior abdominal surgeries she states that most of her pain is right around her bellybutton.  History provided by patient.   Review of Systems  Pertinent positive and negative review of systems noted in HPI.    Physical Exam   Vitals:   10/14/22 0400 10/14/22 0515  BP: 127/71 (!) 147/93  Pulse: 88 94  Resp: 14 (!) 21  Temp:    SpO2: 98% 99%    CONSTITUTIONAL:  uncomfortable-appearing, NAD NEURO:  Alert and oriented x 3, CN 3-12 grossly intact EYES:  eyes equal and reactive ENT/NECK:  Supple, no stridor  CARDIO:  tachycardic, regular rhythm, appears well-perfused  PULM:  No respiratory distress, CTAB GI/GU:  non-distended, generalized abdominal discomfort MSK/SPINE:  No gross deformities, no edema, moves all extremities  SKIN:  no rash, atraumatic   *Additional and/or pertinent findings included in MDM below  Diagnostic and Interventional Summary    EKG Interpretation  Date/Time:  Thursday October 13 2022 23:55:41 EDT Ventricular Rate:  110 PR Interval:  174 QRS Duration: 80 QT Interval:  314 QTC Calculation: 424 R Axis:   -3 Text Interpretation: Sinus tachycardia Low voltage QRS Cannot rule out Anterior infarct , age undetermined Abnormal ECG When compared with ECG of 12-May-2022 06:54, PREVIOUS ECG IS PRESENT Confirmed by Addison Lank [phone]) on 10/14/2022 4:19:00 AM       Labs Reviewed   CBC - Abnormal; Notable for the following components:      Result Value   WBC 15.9 (*)    All other components within normal limits  COMPREHENSIVE METABOLIC PANEL - Abnormal; Notable for the following components:   CO2 19 (*)    Glucose, Bld 212 (*)    Creatinine, Ser 1.05 (*)    All other components within normal limits  BRAIN NATRIURETIC PEPTIDE - Abnormal; Notable for the following components:   B Natriuretic Peptide 102.4 (*)    All other components within normal limits  LIPASE, BLOOD  URINALYSIS, ROUTINE W REFLEX MICROSCOPIC  I-STAT BETA HCG BLOOD, ED ([facility], [facility], [facility] ONLY)  TROPONIN I (HIGH SENSITIVITY)  TROPONIN I (HIGH SENSITIVITY)    CT ABDOMEN PELVIS W CONTRAST  Final Result    DG Chest Port 1 View  Final Result      Medications  ondansetron [facility]) injection 4 mg (4 mg Intravenous Given 10/14/22 0141)  HYDROmorphone (DILAUDID) injection 0.5 mg (0.5 mg Intravenous Given 10/14/22 0141)  iohexol (OMNIPAQUE) 350 MG/ML injection 100 mL (100 mLs Intravenous Contrast Given 10/14/22 0430)  promethazine (PHENERGAN) tablet 25 mg (25 mg Oral Given 10/14/22 0507)     Procedures  /  Critical Care Procedures  ED Course and Medical Decision Making  I have reviewed the triage vital signs, the nursing notes, and pertinent available records from the EMR.  Social Determinants Affecting Complexity of Care: Patient has no clinically  significant social determinants affecting this chief complaint..   ED Course:    Medical Decision Making Patient here with nausea and vomiting and generalized abdominal pain.  High BMI makes abdominal exam more challenging, I do not note any specific tenderness, but she does complain of a lot of pain.  Will check CT.  Patient reassessed after imaging and labs.  She is feeling improved.  She is tolerating oral intake.  Question viral etiology.  Discussed that she might continue to have some vomiting over the next day or 2 and might develop some diarrhea,  but if symptoms markedly changed or worsen she should come back to the ER.  She is agreeable with this plan.  Amount and/or Complexity of Data Reviewed Labs: ordered.    Details: Leukocytosis to 15.9 worrisome for infection Pregnancy test negative Radiology: ordered and independent interpretation performed.    Details: No large volume free air or fluid seen on CT  Risk Prescription drug management.     Consultants: No consultations were needed in caring for this patient.   Treatment and Plan: I considered admission due to patient's initial presentation, but after considering the examination and diagnostic results, patient will not require admission and can be discharged with outpatient follow-up.    Final Clinical Impressions(s) / ED Diagnoses     ICD-10-CM   1. Nausea and vomiting, unspecified vomiting type  R11.2     2. Periumbilical abdominal pain  R10.33       ED Discharge Orders          Ordered    ibuprofen (ADVIL) 600 MG tablet  Every 6 hours PRN        10/14/22 0516    ondansetron (ZOFRAN-ODT) 4 MG disintegrating tablet  Every 8 hours PRN        10/14/22 0516    promethazine (PHENERGAN) 25 MG tablet  Every 8 hours PRN        10/14/22 0516              Discharge Instructions Discussed with and Provided to Patient:     Discharge Instructions      Return to the ER if your symptoms change or worsen.  If this is viral, the vomiting might last another day or two and you might get some diarrhea.   If you have high fever, worsening pain, bloody vomit or bloody stool, return to the ER.       Montine Circle, PA-C 10/14/22 [id number]    Fatima Blank, MD 10/14/22 [phone]

## 2022-10-14 NOTE — ED Provider Notes (Signed)
[facility] Provider Note   CSN: [id number] Arrival date & time: 10/14/22  1733     History  Chief Complaint  Patient presents with   Shortness of Breath    [name] is a 52 y.o. female.  HPI Patient presents 12 hours after being seen and evaluated in the department now with concern for ongoing nausea, vomiting. Patient has multiple medical problems including CHF, obesity, gastroparesis.  She notes that since discharge she has persistent nausea, vomiting, unclear if she has ongoing dyspnea. She notes that she had evaluation including labs, CT, has not been able to tolerate anything since discharge.     Home Medications Prior to Admission medications   Medication Sig Start Date End Date Taking? Authorizing Provider  ADVAIR HFA 230-21 MCG/ACT inhaler Inhale 1-2 puffs into the lungs 2 (two) times daily. 12/20/20   [provider]  albuterol (PROVENTIL) (2.5 MG/3ML) 0.083% nebulizer solution Take 2.5 mg by nebulization every 6 (six) hours as needed for wheezing.    [provider]  ALPRAZolam Duanne Moron) 0.5 MG tablet Take 0.5 mg 2 (two) times daily as needed by mouth for anxiety or sleep.  04/19/17   [provider]  amLODipine (NORVASC) 10 MG tablet Take 10 mg by mouth daily.    [provider]  apixaban (ELIQUIS) 2.5 MG TABS tablet Take 1 tablet (2.5 mg total) by mouth 2 (two) times daily. 09/21/22   Adrian Prows, MD  Azathioprine 75 MG TABS Take 150 mg by mouth 2 (two) times daily. 02/04/21   [provider]  B-D UF III MINI PEN NEEDLES 31G X 5 MM MISC USE AS DIRECTED TWICE A DAY 10/31/18   Renato Shin, MD  Belimumab (BENLYSTA) 200 MG/ML SOAJ Inject 200 mg into the skin once a week. Thursdays 12/19/19   [provider]  Blood Glucose Monitoring Suppl (ONE TOUCH ULTRA 2) w/Device KIT Check blood sugar daily 03/30/22   Elayne Snare, MD  buPROPion (WELLBUTRIN XL) 300 MG 24 hr tablet Take 300 mg  by mouth daily. 03/14/22   [provider]  carvedilol (COREG) 25 MG tablet Take 1.5 tablets (37.5 mg total) by mouth 2 (two) times daily with a meal. 01/01/21   Adrian Prows, MD  Continuous Blood Gluc Sensor (FREESTYLE LIBRE 3 SENSOR) MISC 1 Device by Does not apply route every 14 (fourteen) days. Apply 1 sensor on upper arm every 14 days for continuous glucose monitoring 05/25/22   Elayne Snare, MD  cromolyn (OPTICROM) 4 % ophthalmic solution Place 1 drop into both eyes 2 (two) times daily. 01/14/21   [provider]  cycloSPORINE (RESTASIS) 0.05 % ophthalmic emulsion Place 1 drop into both eyes in the morning, at noon, and at bedtime.     [provider]  diclofenac Sodium (VOLTAREN) 1 % GEL APPLY 2 GRAMS TO AFFECTED AREA 4 TIMES A DAY Patient taking differently: Apply 2 g topically 4 (four) times daily as needed (pain). 01/11/22   Cantwell, Celeste C, PA-C  EASY TOUCH PEN NEEDLES 32G X 4 MM MISC USE TO INJECT INSULIN TWICE DAILY 08/07/19   Renato Shin, MD  ENTRESTO 97-103 MG TAKE 1 TABLET BY MOUTH TWICE A DAY 04/18/22   Adrian Prows, MD  famotidine (PEPCID) 20 MG tablet Take 20 mg by mouth 2 (two) times daily.    [provider]  fluticasone (FLONASE) 50 MCG/ACT nasal spray Place 2 sprays into both nostrils continuous as needed for allergies or  rhinitis.    [provider]  glucose blood (ACCU-CHEK GUIDE) test strip 1 each by Other route 2 (two) times daily. And lancets 2/day 06/18/21   Renato Shin, MD  glucose blood (ACCU-CHEK GUIDE) test strip USE TO TEST BLOOD SUGAR 4 TIMES DAILY 09/13/22   Elayne Snare, MD  glucose blood (ONETOUCH ULTRA) test strip Test blood sugar 4 times a day 03/30/22   Elayne Snare, MD  hydroxychloroquine (PLAQUENIL) 200 MG tablet Take 200 mg by mouth 2 (two) times daily.    [provider]  ibuprofen (ADVIL) 600 MG tablet Take 1 tablet (600 mg total) by mouth every 6 (six) hours as needed. 10/14/22   Montine Circle, PA-C  insulin  regular human CONCENTRATED (HUMULIN R U-500 KWIKPEN) 500 UNIT/ML KwikPen 100 UNITS, 30 MINUTES BEFORE [facility] MEAL 08/16/22   Elayne Snare, MD  Insulin Syringe-Needle U-100 (EASY TOUCH INSULIN SYRINGE) 31G X 5/16" 1 ML MISC USE AS DIRECTED THREE TIMES A DAY 10/12/18   Renato Shin, MD  isosorbide-hydrALAZINE (BIDIL) 20-37.5 MG tablet TAKE TWO (2) TABLETS BY MOUTH THREE TIMES DAILY Patient taking differently: Take 2 tablets by mouth 3 (three) times daily. 10/21/21   Cantwell, Celeste C, PA-C  JARDIANCE 10 MG TABS tablet TAKE 1 TABLET (10 MG TOTAL) BY MOUTH DAILY WITH BREAKFAST. 08/18/22   Elayne Snare, MD  Lancets [facility] ULTRASOFT) lancets Test blood sugar 4 times a day 03/30/22   Elayne Snare, MD  levothyroxine (SYNTHROID) 50 MCG tablet Take 1 tablet (50 mcg total) by mouth daily at 6 (six) AM. 08/30/19   Raiford Noble Latif, DO  megestrol (MEGACE) 40 MG tablet Take 40 mg by mouth daily. 01/30/15   [provider]  metFORMIN (GLUCOPHAGE-XR) 500 MG 24 hr tablet Take 2 tablets (1,000 mg total) by mouth 2 (two) times daily. 02/02/21   Renato Shin, MD  Multiple Vitamin (MULTIVITAMIN WITH MINERALS) TABS tablet Take 1 tablet by mouth daily. 08/30/19   Raiford Noble Latif, DO  ondansetron (ZOFRAN-ODT) 4 MG disintegrating tablet Take 1 tablet (4 mg total) by mouth every 8 (eight) hours as needed for nausea or vomiting. 10/14/22   Montine Circle, PA-C  oxybutynin (DITROPAN-XL) 5 MG 24 hr tablet Take 5 mg by mouth daily. 04/25/22   [provider]  pregabalin (LYRICA) 75 MG capsule Take 75 mg 3 (three) times daily by mouth.  01/28/15   [provider]  prochlorperazine (COMPAZINE) 10 MG tablet Take 1 tablet (10 mg total) by mouth every 6 (six) hours as needed for nausea or vomiting. 05/14/22   Bonnielee Haff, MD  promethazine (PHENERGAN) 25 MG tablet Take 1 tablet (25 mg total) by mouth every 8 (eight) hours as needed for nausea or vomiting. 10/14/22   Montine Circle, PA-C  QUEtiapine  (SEROQUEL) 300 MG tablet Take 300 mg by mouth at bedtime. 01/08/21   [provider]  rosuvastatin (CRESTOR) 20 MG tablet Take 1 tablet (20 mg total) by mouth daily. 06/09/21 09/21/22  Cantwell, Celeste C, PA-C  Semaglutide, 2 MG/DOSE, 8 MG/3ML SOPN Inject 2 mg as directed once a week. 08/18/22   Elayne Snare, MD  spironolactone (ALDACTONE) 50 MG tablet TAKE 1 TABLET BY MOUTH EVERY DAY Patient taking differently: Take 50 mg by mouth daily. 02/04/22   Adrian Prows, MD  sucralfate (CARAFATE) 1 GM/10ML suspension Take 10 mLs by mouth 2 (two) times daily as needed (abdomen pain). 12/09/20   [provider]  topiramate (TOPAMAX) 25 MG tablet Take 75 mg by mouth at  bedtime. 03/17/16   [provider]  torsemide (DEMADEX) 20 MG tablet Take 1 tablet (20 mg total) by mouth at bedtime. 06/09/21   Cantwell, Celeste C, PA-C  TRANSDERM-SCOP 1 MG/3DAYS Place 1 patch onto the skin every 3 (three) days. As needed for nausea 12/22/21   [provider]  VICTOZA 18 MG/3ML SOPN Inject 0.6 mg into the skin. 06/11/22   [provider]  Vitamin D, Ergocalciferol, (DRISDOL) 1.25 MG (50000 UNIT) CAPS capsule Take 50,000 Units by mouth once a week. 09/20/22   [provider]      Allergies    Metoclopramide, Codeine, Hydrocodone, Penicillins, and Percocet [oxycodone-acetaminophen]    Review of Systems   Review of Systems  All other systems reviewed and are negative.   Physical Exam Updated Vital Signs BP (!) 152/82   Pulse 97   Temp 98.7 F (37.1 C) (Oral)   Resp 16   Ht 5\' 8"  (1.727 m)   Wt (!) 149.7 kg   LMP 03/24/2017   SpO2 100%   BMI 50.18 kg/m  Physical Exam Vitals and nursing note reviewed.  Constitutional:      General: She is not in acute distress.    Appearance: She is well-developed. She is obese. She is ill-appearing. She is not toxic-appearing or diaphoretic.  HENT:     Head: Normocephalic and atraumatic.  Eyes:     Conjunctiva/sclera: Conjunctivae  normal.  Cardiovascular:     Rate and Rhythm: Normal rate and regular rhythm.  Pulmonary:     Effort: Tachypnea and accessory muscle usage present. No respiratory distress.  Abdominal:     General: There is no distension.     Comments: Protuberant, not guarding  Skin:    General: Skin is warm and dry.  Neurological:     Mental Status: She is alert and oriented to person, place, and time.     Cranial Nerves: No cranial nerve deficit.  Psychiatric:        Mood and Affect: Mood normal.     ED Results / Procedures / Treatments   Labs (all labs ordered are listed, but only abnormal results are displayed) Labs Reviewed - No data to display  EKG None  Radiology CT ABDOMEN PELVIS W CONTRAST  Result Date: 10/14/2022 CLINICAL DATA:  Substernal chest discomfort. Periumbilical abdominal pain, nausea, and vomiting. EXAM: CT ABDOMEN AND PELVIS WITH CONTRAST TECHNIQUE: Multidetector CT imaging of the abdomen and pelvis was performed using the standard protocol following bolus administration of intravenous contrast. RADIATION DOSE REDUCTION: This exam was performed according to the departmental dose-optimization program which includes automated exposure control, adjustment of the mA and/or kV according to patient size and/or use of iterative reconstruction technique. CONTRAST:  161mL OMNIPAQUE IOHEXOL 350 MG/ML SOLN COMPARISON:  05/09/2022. FINDINGS: Lower chest: The heart is enlarged. Mild atelectasis is noted at the lung bases. Hepatobiliary: A subcentimeter hypodensity is present in the anterior right lobe of the liver which is too small to further characterize. No biliary ductal dilatation. The gallbladder is without stones. Pancreas: Unremarkable. No pancreatic ductal dilatation or surrounding inflammatory changes. Spleen: Normal in size without focal abnormality. Adrenals/Urinary Tract: No adrenal nodule or mass. The kidneys enhance symmetrically. A nonobstructive renal calculus is noted on the  right. No hydronephrosis bilaterally the bladder is unremarkable. Stomach/Bowel: There is a small hiatal hernia. Stomach is within normal limits. Appendix appears normal. No evidence of bowel wall thickening, distention, or inflammatory changes. No free air or pneumatosis. Vascular/Lymphatic: No significant vascular  findings are present. No enlarged abdominal or pelvic lymph nodes. Reproductive: The uterus is within normal limits. There is a 7 mm fat attenuation lesion in the left adnexa, possible dermoid. No adnexal mass on the right. Other: No abdominopelvic ascites. Musculoskeletal: Mild degenerative changes in the lumbar spine. No acute or suspicious osseous abnormality. IMPRESSION: 1. No acute intra-abdominal process. 2. Small hiatal hernia. Electronically Signed   By: Brett Fairy M.D.   On: 10/14/2022 04:41   DG Chest Port 1 View  Result Date: 10/14/2022 CLINICAL DATA:  Shortness of breath EXAM: PORTABLE CHEST 1 VIEW COMPARISON:  05/09/2022 FINDINGS: The heart size and mediastinal contours are within normal limits. Both lungs are clear. The visualized skeletal structures are unremarkable. IMPRESSION: No active disease. Electronically Signed   By: Ulyses Jarred M.D.   On: 10/14/2022 01:16    Procedures Procedures    Medications Ordered in ED Medications  lactated ringers bolus 1,000 mL (0 mLs Intravenous Stopped 10/14/22 1856)  droperidol (INAPSINE) 2.5 MG/ML injection 1.25 mg (1.25 mg Intravenous Given 10/14/22 1758)  HYDROmorphone (DILAUDID) injection 1 mg (1 mg Intravenous Given 10/14/22 2143)    ED Course/ Medical Decision Making/ A&P                             Medical Decision Making Adult female with multiple medical problems as above, including obesity, gastroparesis, diabetes presents with nausea, vomiting.  Patient is awake, alert, hemodynamically unremarkable aside from mild hypertension.  She is afebrile, without hypoxia, there is low suspicion for new pulmonary phenomena.  Some  suspicion for recurrent nausea, vomiting.  Patient placed on monitors, received droperidol, fluids.    Amount and/or Complexity of Data Reviewed External Data Reviewed: notes. Labs:     Details: I reviewed labs from earlier this morning unremarkable Radiology: independent interpretation performed.    Details: CT scan earlier today reviewed unremarkable ECG/medicine tests: ordered.  Risk Prescription drug management. Decision regarding hospitalization.   11:32 PM Patient had resolution of vomiting, pain, has no dyspnea, is hemodynamically couple.  Suspicion for exacerbation of gastroparesis, given the reassuring labs, CT within the past 24 hours.  Given resolution of symptoms, patient appropriate for discharge with close outpatient follow-up.        Final Clinical Impression(s) / ED Diagnoses Final diagnoses:  Bilious vomiting with nausea    Rx / DC Orders ED Discharge Orders     None         Carmin Muskrat, MD 10/14/22 2333

## 2022-10-15 ENCOUNTER — Inpatient Hospital Stay (HOSPITAL_COMMUNITY)
Admission: EM | Admit: 2022-10-15 | Discharge: 2022-10-20 | DRG: 074 | Disposition: A | Discharge status: Home / Self Care | Payer: Medicaid Other | Attending: Internal Medicine | Admitting: Internal Medicine

## 2022-10-15 ENCOUNTER — Encounter (HOSPITAL_COMMUNITY): Payer: Self-pay | Admitting: *Deleted

## 2022-10-15 ENCOUNTER — Emergency Department (HOSPITAL_COMMUNITY): Payer: Medicaid Other

## 2022-10-15 ENCOUNTER — Other Ambulatory Visit: Payer: Self-pay

## 2022-10-15 DIAGNOSIS — R112 Nausea with vomiting, unspecified: Secondary | ICD-10-CM | POA: Diagnosis present

## 2022-10-15 DIAGNOSIS — Z7951 Long term (current) use of inhaled steroids: Secondary | ICD-10-CM

## 2022-10-15 DIAGNOSIS — Z7985 Long-term (current) use of injectable non-insulin antidiabetic drugs: Secondary | ICD-10-CM

## 2022-10-15 DIAGNOSIS — Z880 Allergy status to penicillin: Secondary | ICD-10-CM

## 2022-10-15 DIAGNOSIS — R9431 Abnormal electrocardiogram [ECG] [EKG]: Secondary | ICD-10-CM | POA: Diagnosis present

## 2022-10-15 DIAGNOSIS — K3184 Gastroparesis: Secondary | ICD-10-CM

## 2022-10-15 DIAGNOSIS — I509 Heart failure, unspecified: Secondary | ICD-10-CM | POA: Diagnosis not present

## 2022-10-15 DIAGNOSIS — E662 Morbid (severe) obesity with alveolar hypoventilation: Secondary | ICD-10-CM | POA: Diagnosis present

## 2022-10-15 DIAGNOSIS — I5032 Chronic diastolic (congestive) heart failure: Secondary | ICD-10-CM | POA: Diagnosis not present

## 2022-10-15 DIAGNOSIS — E7800 Pure hypercholesterolemia, unspecified: Secondary | ICD-10-CM | POA: Diagnosis present

## 2022-10-15 DIAGNOSIS — Z79890 Hormone replacement therapy (postmenopausal): Secondary | ICD-10-CM

## 2022-10-15 DIAGNOSIS — Z800 Family history of malignant neoplasm of digestive organs: Secondary | ICD-10-CM

## 2022-10-15 DIAGNOSIS — E1143 Type 2 diabetes mellitus with diabetic autonomic (poly)neuropathy: Secondary | ICD-10-CM | POA: Diagnosis not present

## 2022-10-15 DIAGNOSIS — K449 Diaphragmatic hernia without obstruction or gangrene: Secondary | ICD-10-CM | POA: Diagnosis present

## 2022-10-15 DIAGNOSIS — I110 Hypertensive heart disease with heart failure: Secondary | ICD-10-CM | POA: Diagnosis present

## 2022-10-15 DIAGNOSIS — E1169 Type 2 diabetes mellitus with other specified complication: Secondary | ICD-10-CM

## 2022-10-15 DIAGNOSIS — Z823 Family history of stroke: Secondary | ICD-10-CM

## 2022-10-15 DIAGNOSIS — Z8673 Personal history of transient ischemic attack (TIA), and cerebral infarction without residual deficits: Secondary | ICD-10-CM

## 2022-10-15 DIAGNOSIS — I2699 Other pulmonary embolism without acute cor pulmonale: Secondary | ICD-10-CM | POA: Diagnosis present

## 2022-10-15 DIAGNOSIS — Z885 Allergy status to narcotic agent status: Secondary | ICD-10-CM

## 2022-10-15 DIAGNOSIS — F324 Major depressive disorder, single episode, in partial remission: Secondary | ICD-10-CM

## 2022-10-15 DIAGNOSIS — I10 Essential (primary) hypertension: Secondary | ICD-10-CM

## 2022-10-15 DIAGNOSIS — F419 Anxiety disorder, unspecified: Secondary | ICD-10-CM | POA: Diagnosis present

## 2022-10-15 DIAGNOSIS — Z560 Unemployment, unspecified: Secondary | ICD-10-CM

## 2022-10-15 DIAGNOSIS — Z794 Long term (current) use of insulin: Secondary | ICD-10-CM

## 2022-10-15 DIAGNOSIS — Z7901 Long term (current) use of anticoagulants: Secondary | ICD-10-CM

## 2022-10-15 DIAGNOSIS — E785 Hyperlipidemia, unspecified: Secondary | ICD-10-CM

## 2022-10-15 DIAGNOSIS — R1084 Generalized abdominal pain: Principal | ICD-10-CM | POA: Diagnosis present

## 2022-10-15 DIAGNOSIS — Z8669 Personal history of other diseases of the nervous system and sense organs: Secondary | ICD-10-CM

## 2022-10-15 DIAGNOSIS — Z803 Family history of malignant neoplasm of breast: Secondary | ICD-10-CM

## 2022-10-15 DIAGNOSIS — K5909 Other constipation: Secondary | ICD-10-CM | POA: Diagnosis present

## 2022-10-15 DIAGNOSIS — Z8701 Personal history of pneumonia (recurrent): Secondary | ICD-10-CM

## 2022-10-15 DIAGNOSIS — Z79899 Other long term (current) drug therapy: Secondary | ICD-10-CM

## 2022-10-15 DIAGNOSIS — F39 Unspecified mood [affective] disorder: Secondary | ICD-10-CM | POA: Diagnosis present

## 2022-10-15 DIAGNOSIS — Z7984 Long term (current) use of oral hypoglycemic drugs: Secondary | ICD-10-CM

## 2022-10-15 DIAGNOSIS — M329 Systemic lupus erythematosus, unspecified: Secondary | ICD-10-CM

## 2022-10-15 DIAGNOSIS — E876 Hypokalemia: Secondary | ICD-10-CM | POA: Diagnosis not present

## 2022-10-15 DIAGNOSIS — D649 Anemia, unspecified: Secondary | ICD-10-CM

## 2022-10-15 DIAGNOSIS — Z825 Family history of asthma and other chronic lower respiratory diseases: Secondary | ICD-10-CM

## 2022-10-15 DIAGNOSIS — K2100 Gastro-esophageal reflux disease with esophagitis, without bleeding: Secondary | ICD-10-CM | POA: Diagnosis present

## 2022-10-15 DIAGNOSIS — Z8042 Family history of malignant neoplasm of prostate: Secondary | ICD-10-CM

## 2022-10-15 DIAGNOSIS — Z86711 Personal history of pulmonary embolism: Secondary | ICD-10-CM

## 2022-10-15 DIAGNOSIS — I5042 Chronic combined systolic (congestive) and diastolic (congestive) heart failure: Secondary | ICD-10-CM | POA: Diagnosis present

## 2022-10-15 DIAGNOSIS — D72829 Elevated white blood cell count, unspecified: Secondary | ICD-10-CM | POA: Diagnosis present

## 2022-10-15 DIAGNOSIS — Z833 Family history of diabetes mellitus: Secondary | ICD-10-CM

## 2022-10-15 DIAGNOSIS — Z888 Allergy status to other drugs, medicaments and biological substances status: Secondary | ICD-10-CM

## 2022-10-15 DIAGNOSIS — Z8249 Family history of ischemic heart disease and other diseases of the circulatory system: Secondary | ICD-10-CM

## 2022-10-15 DIAGNOSIS — M3500 Sicca syndrome, unspecified: Secondary | ICD-10-CM | POA: Diagnosis present

## 2022-10-15 DIAGNOSIS — Z79620 Long term (current) use of immunosuppressive biologic: Secondary | ICD-10-CM

## 2022-10-15 DIAGNOSIS — Z87891 Personal history of nicotine dependence: Secondary | ICD-10-CM

## 2022-10-15 DIAGNOSIS — Z6841 Body Mass Index (BMI) 40.0 and over, adult: Secondary | ICD-10-CM

## 2022-10-15 LAB — URINALYSIS, ROUTINE W REFLEX MICROSCOPIC
Bilirubin Urine: NEGATIVE
Glucose, UA: >=500 mg/dL — AB
Hgb urine dipstick: NEGATIVE
Ketones, ur: 20 mg/dL — AB
Nitrite: NEGATIVE
Protein, ur: NEGATIVE mg/dL
Specific Gravity, Urine: 1.03 (ref 1.005–1.030)
pH: 5 (ref 5.0–8.0)

## 2022-10-15 LAB — CBC WITH DIFFERENTIAL/PLATELET
Abs Immature Granulocytes: 0.08 10*3/uL — ABNORMAL HIGH (ref 0.00–0.07)
Basophils Absolute: 0 10*3/uL (ref 0.0–0.1)
Basophils Relative: 0 %
Eosinophils Absolute: 0.1 10*3/uL (ref 0.0–0.5)
Eosinophils Relative: 0 %
HCT: 33.4 % — ABNORMAL LOW (ref 36.0–46.0)
Hemoglobin: 10.6 g/dL — ABNORMAL LOW (ref 12.0–15.0)
Immature Granulocytes: 1 %
Lymphocytes Relative: 10 %
Lymphs Abs: 1.5 10*3/uL (ref 0.7–4.0)
MCH: 29.3 pg (ref 26.0–34.0)
MCHC: 31.7 g/dL (ref 30.0–36.0)
MCV: 92.3 fL (ref 80.0–100.0)
Monocytes Absolute: 1.2 10*3/uL — ABNORMAL HIGH (ref 0.1–1.0)
Monocytes Relative: 8 %
Neutro Abs: 11.5 10*3/uL — ABNORMAL HIGH (ref 1.7–7.7)
Neutrophils Relative %: 81 %
Platelets: 237 10*3/uL (ref 150–400)
RBC: 3.62 MIL/uL — ABNORMAL LOW (ref 3.87–5.11)
RDW: 14.5 % (ref 11.5–15.5)
WBC: 14.4 10*3/uL — ABNORMAL HIGH (ref 4.0–10.5)
nRBC: 0 % (ref 0.0–0.2)

## 2022-10-15 LAB — COMPREHENSIVE METABOLIC PANEL
ALT: 9 U/L (ref 0–44)
AST: 13 U/L — ABNORMAL LOW (ref 15–41)
Albumin: 3.1 g/dL — ABNORMAL LOW (ref 3.5–5.0)
Alkaline Phosphatase: 82 U/L (ref 38–126)
Anion gap: 12 (ref 5–15)
BUN: 16 mg/dL (ref 6–20)
CO2: 18 mmol/L — ABNORMAL LOW (ref 22–32)
Calcium: 8.7 mg/dL — ABNORMAL LOW (ref 8.9–10.3)
Chloride: 107 mmol/L (ref 98–111)
Creatinine, Ser: 0.93 mg/dL (ref 0.44–1.00)
GFR, Estimated: >60 mL/min (ref >60)
Glucose, Bld: 226 mg/dL — ABNORMAL HIGH (ref 70–99)
Potassium: 3.8 mmol/L (ref 3.5–5.1)
Sodium: 137 mmol/L (ref 135–145)
Total Bilirubin: 0.8 mg/dL (ref 0.3–1.2)
Total Protein: 6.3 g/dL — ABNORMAL LOW (ref 6.5–8.1)

## 2022-10-15 LAB — TROPONIN I (HIGH SENSITIVITY)
Troponin I (High Sensitivity): 5 ng/L (ref <18)
Troponin I (High Sensitivity): 9 ng/L (ref <18)

## 2022-10-15 LAB — LIPASE, BLOOD: Lipase: 39 U/L (ref 11–51)

## 2022-10-15 MED ORDER — QUETIAPINE FUMARATE 100 MG PO TABS
300.0000 mg | ORAL_TABLET | Freq: Every day | ORAL | Status: DC
  Administered 2022-10-16 – 2022-10-19 (×4): 300 mg via ORAL
  Filled 2022-10-15 (×5): qty 3

## 2022-10-15 MED ORDER — PREGABALIN 75 MG PO CAPS
75.0000 mg | ORAL_CAPSULE | Freq: Three times a day (TID) | ORAL | Status: DC
  Administered 2022-10-16 – 2022-10-20 (×15): 75 mg via ORAL
  Filled 2022-10-15 (×16): qty 1

## 2022-10-15 MED ORDER — DROPERIDOL 2.5 MG/ML IJ SOLN
1.2500 mg | Freq: Once | INTRAMUSCULAR | Status: AC
  Administered 2022-10-15: 1.25 mg via INTRAVENOUS
  Filled 2022-10-15: qty 2

## 2022-10-15 MED ORDER — CYCLOSPORINE 0.05 % OP EMUL
1.0000 [drp] | Freq: Three times a day (TID) | OPHTHALMIC | Status: DC
  Administered 2022-10-16 – 2022-10-20 (×15): 1 [drp] via OPHTHALMIC
  Filled 2022-10-15 (×17): qty 30

## 2022-10-15 MED ORDER — ONDANSETRON HCL 4 MG/2ML IJ SOLN
4.0000 mg | Freq: Once | INTRAMUSCULAR | Status: AC
  Administered 2022-10-15: 4 mg via INTRAVENOUS
  Filled 2022-10-15: qty 2

## 2022-10-15 MED ORDER — LEVOTHYROXINE SODIUM 50 MCG PO TABS
50.0000 ug | ORAL_TABLET | Freq: Every day | ORAL | Status: DC
  Administered 2022-10-16 – 2022-10-20 (×5): 50 ug via ORAL
  Filled 2022-10-15 (×5): qty 1

## 2022-10-15 MED ORDER — INSULIN ASPART 100 UNIT/ML IJ SOLN
0.0000 [IU] | Freq: Every day | INTRAMUSCULAR | Status: DC
  Administered 2022-10-17: 2 [IU] via SUBCUTANEOUS
  Administered 2022-10-19: 4 [IU] via SUBCUTANEOUS

## 2022-10-15 MED ORDER — DEXAMETHASONE SODIUM PHOSPHATE 4 MG/ML IJ SOLN
4.0000 mg | Freq: Once | INTRAMUSCULAR | Status: AC
  Administered 2022-10-15: 4 mg via INTRAVENOUS
  Filled 2022-10-15: qty 1

## 2022-10-15 MED ORDER — SACUBITRIL-VALSARTAN 97-103 MG PO TABS
1.0000 | ORAL_TABLET | Freq: Two times a day (BID) | ORAL | Status: DC
  Administered 2022-10-16 – 2022-10-17 (×4): 1 via ORAL
  Filled 2022-10-15 (×7): qty 1

## 2022-10-15 MED ORDER — SODIUM CHLORIDE 0.9% FLUSH
3.0000 mL | Freq: Two times a day (BID) | INTRAVENOUS | Status: DC
  Administered 2022-10-15 – 2022-10-19 (×9): 3 mL via INTRAVENOUS

## 2022-10-15 MED ORDER — ALBUTEROL SULFATE (2.5 MG/3ML) 0.083% IN NEBU: 2.5000 mg | INHALATION_SOLUTION | Freq: Four times a day (QID) | RESPIRATORY_TRACT | Status: DC | PRN

## 2022-10-15 MED ORDER — SUCRALFATE 1 GM/10ML PO SUSP
1.0000 g | Freq: Two times a day (BID) | ORAL | Status: DC | PRN
  Filled 2022-10-15 (×2): qty 10

## 2022-10-15 MED ORDER — HYDROMORPHONE HCL 1 MG/ML IJ SOLN
1.0000 mg | Freq: Once | INTRAMUSCULAR | Status: AC
  Administered 2022-10-15: 1 mg via INTRAVENOUS
  Filled 2022-10-15: qty 1

## 2022-10-15 MED ORDER — HYDROMORPHONE HCL 1 MG/ML IJ SOLN
0.5000 mg | INTRAMUSCULAR | Status: AC | PRN
  Administered 2022-10-15 – 2022-10-16 (×3): 0.5 mg via INTRAVENOUS
  Filled 2022-10-15 (×3): qty 0.5

## 2022-10-15 MED ORDER — LACTATED RINGERS IV BOLUS
1000.0000 mL | Freq: Once | INTRAVENOUS | Status: AC
  Administered 2022-10-15: 1000 mL via INTRAVENOUS

## 2022-10-15 MED ORDER — APIXABAN 2.5 MG PO TABS
2.5000 mg | ORAL_TABLET | Freq: Two times a day (BID) | ORAL | Status: DC
  Administered 2022-10-16 – 2022-10-17 (×4): 2.5 mg via ORAL
  Filled 2022-10-15 (×5): qty 1

## 2022-10-15 MED ORDER — INSULIN ASPART 100 UNIT/ML IJ SOLN
0.0000 [IU] | Freq: Three times a day (TID) | INTRAMUSCULAR | Status: DC
  Administered 2022-10-16 (×2): 3 [IU] via SUBCUTANEOUS
  Administered 2022-10-16: 2 [IU] via SUBCUTANEOUS
  Administered 2022-10-17 (×3): 3 [IU] via SUBCUTANEOUS
  Administered 2022-10-18: 5 [IU] via SUBCUTANEOUS
  Administered 2022-10-18: 8 [IU] via SUBCUTANEOUS
  Administered 2022-10-18: 3 [IU] via SUBCUTANEOUS
  Administered 2022-10-19 (×2): 5 [IU] via SUBCUTANEOUS
  Administered 2022-10-19: 8 [IU] via SUBCUTANEOUS
  Administered 2022-10-20: 5 [IU] via SUBCUTANEOUS
  Administered 2022-10-20: 8 [IU] via SUBCUTANEOUS
  Administered 2022-10-20: 5 [IU] via SUBCUTANEOUS

## 2022-10-15 MED ORDER — ISOSORB DINITRATE-HYDRALAZINE 20-37.5 MG PO TABS: 2.0000 | ORAL_TABLET | Freq: Three times a day (TID) | ORAL | Status: DC

## 2022-10-15 MED ORDER — LORAZEPAM 2 MG/ML IJ SOLN
0.5000 mg | Freq: Once | INTRAMUSCULAR | Status: AC | PRN
  Administered 2022-10-15: 0.5 mg via INTRAVENOUS
  Filled 2022-10-15 (×2): qty 1

## 2022-10-15 MED ORDER — ACETAMINOPHEN 325 MG PO TABS
650.0000 mg | ORAL_TABLET | Freq: Four times a day (QID) | ORAL | Status: DC | PRN
  Filled 2022-10-15: qty 2

## 2022-10-15 MED ORDER — BUPROPION HCL ER (XL) 150 MG PO TB24
300.0000 mg | ORAL_TABLET | Freq: Every day | ORAL | Status: DC
  Administered 2022-10-16 – 2022-10-20 (×5): 300 mg via ORAL
  Filled 2022-10-15 (×5): qty 2

## 2022-10-15 MED ORDER — LORAZEPAM 0.5 MG PO TABS
0.5000 mg | ORAL_TABLET | Freq: Four times a day (QID) | ORAL | Status: DC | PRN
  Administered 2022-10-15: 1 mg via ORAL
  Filled 2022-10-15 (×2): qty 2

## 2022-10-15 MED ORDER — TORSEMIDE 20 MG PO TABS
20.0000 mg | ORAL_TABLET | Freq: Every day | ORAL | Status: DC
  Administered 2022-10-16 – 2022-10-19 (×4): 20 mg via ORAL
  Filled 2022-10-15 (×5): qty 1

## 2022-10-15 MED ORDER — SPIRONOLACTONE 12.5 MG HALF TABLET: 50.0000 mg | ORAL_TABLET | Freq: Every day | ORAL | Status: DC

## 2022-10-15 MED ORDER — ROSUVASTATIN CALCIUM 20 MG PO TABS
20.0000 mg | ORAL_TABLET | Freq: Every day | ORAL | Status: DC
  Administered 2022-10-16 – 2022-10-20 (×5): 20 mg via ORAL
  Filled 2022-10-15 (×5): qty 1

## 2022-10-15 MED ORDER — ACETAMINOPHEN 650 MG RE SUPP: 650.0000 mg | Freq: Four times a day (QID) | RECTAL | Status: DC | PRN

## 2022-10-15 MED ORDER — HYDROMORPHONE HCL 1 MG/ML IJ SOLN
0.5000 mg | Freq: Once | INTRAMUSCULAR | Status: AC | PRN
  Administered 2022-10-15: 0.5 mg via INTRAVENOUS
  Filled 2022-10-15: qty 0.5

## 2022-10-15 MED ORDER — POLYETHYLENE GLYCOL 3350 17 G PO PACK: 17.0000 g | PACK | Freq: Every day | ORAL | Status: DC | PRN

## 2022-10-15 MED ORDER — HYDROXYCHLOROQUINE SULFATE 200 MG PO TABS
200.0000 mg | ORAL_TABLET | Freq: Two times a day (BID) | ORAL | Status: DC
  Administered 2022-10-16 – 2022-10-20 (×10): 200 mg via ORAL
  Filled 2022-10-15 (×13): qty 1

## 2022-10-15 MED ORDER — TOPIRAMATE 25 MG PO TABS
75.0000 mg | ORAL_TABLET | Freq: Every day | ORAL | Status: DC
  Administered 2022-10-16 – 2022-10-19 (×5): 75 mg via ORAL
  Filled 2022-10-15 (×6): qty 3

## 2022-10-15 MED ORDER — MOMETASONE FURO-FORMOTEROL FUM 200-5 MCG/ACT IN AERO
2.0000 | INHALATION_SPRAY | Freq: Two times a day (BID) | RESPIRATORY_TRACT | Status: DC
  Administered 2022-10-16 – 2022-10-20 (×9): 2 via RESPIRATORY_TRACT
  Filled 2022-10-15: qty 8.8

## 2022-10-15 MED ORDER — CARVEDILOL 25 MG PO TABS
37.5000 mg | ORAL_TABLET | Freq: Two times a day (BID) | ORAL | Status: DC
  Administered 2022-10-15 – 2022-10-20 (×10): 37.5 mg via ORAL
  Filled 2022-10-15 (×11): qty 1

## 2022-10-15 MED ORDER — AMLODIPINE BESYLATE 5 MG PO TABS: 10.0000 mg | ORAL_TABLET | Freq: Every day | ORAL | Status: DC

## 2022-10-15 NOTE — ED Provider Notes (Signed)
Patient care assumed from [place], PA-C at shift handoff.  Please see his note for full details  In short, 52 year old female patient presented to the emergency department complaining of abdominal pain, nausea, vomiting, with history of gastroparesis.  This is patient's third visit to the emergency department within 3 days for the same symptoms.  Patient reports mild improvement in symptoms overnight last night after discharge which returned earlier this morning.  Patient administered Zofran, Dilaudid x 2, LR bolus, and droperidol with mild relief in symptoms.  Patient continues to be symptomatic with poorly controlled symptoms.  Plan on discussing admission with hospitalist Physical Exam  BP 123/73   Pulse 100   Temp 98.2 F (36.8 C) (Oral)   Resp 11   Ht 5\' 8"  (1.727 m)   Wt (!) 149.7 kg   LMP 03/24/2017   SpO2 100%   BMI 50.18 kg/m   Physical Exam  Procedures  Procedures  ED Course / MDM   Clinical Course as of 10/15/22 1547  Sat Oct 15, 2022  1320 Reevaluation patient reports pain is still at a 8/10.  Still nauseous.  Will provide nausea, and pain medication and then p.o. challenge.  If she fails p.o. challenge or does not have significant improvement in pain will consider admission given this is her third visit in 3 days. [AA]    Clinical Course User Index [AA] Evlyn Courier, PA-C   Medical Decision Making Amount and/or Complexity of Data Reviewed Labs: ordered. Radiology: ordered.  Risk Prescription drug management. Decision regarding hospitalization.   I discussed the case with Dr. Trilby Drummer, [facility] hospitalist, who agreed to see the patient for admission.       Ronny Bacon 10/15/22 1610    Regan Lemming, MD 10/15/22 1620

## 2022-10-15 NOTE — ED Triage Notes (Signed)
Patient presents to ed c/o nausea was just seen in the ED earlier and states she isn't feeling any better.

## 2022-10-15 NOTE — Hospital Course (Signed)
EMS from home with substernal chest discomfort [id number] periumbilical abd pain/ n/ vomiting Sent home with relief of symptoms thinking gastroparesis  Came back 3/16 with nausea  WBC 14.4 K 3.9 Co2 19 to 18 Creatinine 1.05 BNP 102.4 Trop 7  CXR Unchanged cardiomegaly. Mild lower lung predominant interstitial opacities, could reflect mild interstitial edema.

## 2022-10-15 NOTE — Plan of Care (Signed)
  Problem: Education: Goal: Knowledge of General Education information will improve Description: Including pain rating scale, medication(s)/side effects and non-pharmacologic comfort measures Outcome: Progressing   Problem: Safety: Goal: Ability to remain free from injury will improve Outcome: Progressing   

## 2022-10-15 NOTE — ED Provider Notes (Signed)
[place] EMERGENCY DEPARTMENT AT [facility] Provider Note   CSN: [id number] Arrival date & time: 10/15/22  1002     History  Chief Complaint  Patient presents with   Abdominal Pain   Nausea    [name] is a 52 y.o. female.  52 year old female with past medical history significant for gastroparesis presents today for evaluation of abdominal pain, nausea, vomiting, inability to keep food or drink down.  This is her third visit to the ED in the past 3 days.  She states after she was discharged yesterday she had improvement in her symptoms initially however overnight her symptoms came back.  Also endorses chest pain.  She states she has had chest pain throughout this episode however today it is worse.  The history is provided by the patient. No language interpreter was used.       Home Medications Prior to Admission medications   Medication Sig Start Date End Date Taking? Authorizing Provider  ADVAIR HFA 230-21 MCG/ACT inhaler Inhale 1-2 puffs into the lungs 2 (two) times daily. 12/20/20   [provider]  albuterol (PROVENTIL) (2.5 MG/3ML) 0.083% nebulizer solution Take 2.5 mg by nebulization every 6 (six) hours as needed for wheezing.    [provider]  ALPRAZolam Prudy Feeler) 0.5 MG tablet Take 0.5 mg 2 (two) times daily as needed by mouth for anxiety or sleep.  04/19/17   [provider]  amLODipine (NORVASC) 10 MG tablet Take 10 mg by mouth daily.    [provider]  apixaban (ELIQUIS) 2.5 MG TABS tablet Take 1 tablet (2.5 mg total) by mouth 2 (two) times daily. 09/21/22   Yates Decamp, MD  Azathioprine 75 MG TABS Take 150 mg by mouth 2 (two) times daily. 02/04/21   [provider]  B-D UF III MINI PEN NEEDLES 31G X 5 MM MISC USE AS DIRECTED TWICE A DAY 10/31/18   Romero Belling, MD  Belimumab (BENLYSTA) 200 MG/ML SOAJ Inject 200 mg into the skin once a week. Thursdays 12/19/19   [provider]  Blood Glucose Monitoring  Suppl (ONE TOUCH ULTRA 2) w/Device KIT Check blood sugar daily 03/30/22   Reather Littler, MD  buPROPion (WELLBUTRIN XL) 300 MG 24 hr tablet Take 300 mg by mouth daily. 03/14/22   [provider]  carvedilol (COREG) 25 MG tablet Take 1.5 tablets (37.5 mg total) by mouth 2 (two) times daily with a meal. 01/01/21   Yates Decamp, MD  Continuous Blood Gluc Sensor (FREESTYLE LIBRE 3 SENSOR) MISC 1 Device by Does not apply route every 14 (fourteen) days. Apply 1 sensor on upper arm every 14 days for continuous glucose monitoring 05/25/22   Reather Littler, MD  cromolyn (OPTICROM) 4 % ophthalmic solution Place 1 drop into both eyes 2 (two) times daily. 01/14/21   [provider]  cycloSPORINE (RESTASIS) 0.05 % ophthalmic emulsion Place 1 drop into both eyes in the morning, at noon, and at bedtime.     [provider]  diclofenac Sodium (VOLTAREN) 1 % GEL APPLY 2 GRAMS TO AFFECTED AREA 4 TIMES A DAY Patient taking differently: Apply 2 g topically 4 (four) times daily as needed (pain). 01/11/22   Cantwell, Celeste C, PA-C  EASY TOUCH PEN NEEDLES 32G X 4 MM MISC USE TO INJECT INSULIN TWICE DAILY 08/07/19   Romero Belling, MD  ENTRESTO 97-103 MG TAKE 1 TABLET BY MOUTH TWICE A DAY 04/18/22   Yates Decamp, MD  famotidine (PEPCID) 20 MG tablet  Take 20 mg by mouth 2 (two) times daily.    [provider]  fluticasone (FLONASE) 50 MCG/ACT nasal spray Place 2 sprays into both nostrils continuous as needed for allergies or rhinitis.    [provider]  glucose blood (ACCU-CHEK GUIDE) test strip 1 each by Other route 2 (two) times daily. And lancets 2/day 06/18/21   Romero Belling, MD  glucose blood (ACCU-CHEK GUIDE) test strip USE TO TEST BLOOD SUGAR 4 TIMES DAILY 09/13/22   Reather Littler, MD  glucose blood (ONETOUCH ULTRA) test strip Test blood sugar 4 times a day 03/30/22   Reather Littler, MD  hydroxychloroquine (PLAQUENIL) 200 MG tablet Take 200 mg by mouth 2 (two) times daily.    [provider]  ibuprofen (ADVIL) 600 MG tablet Take 1 tablet (600 mg total) by mouth every 6 (six) hours as needed. 10/14/22   Roxy Horseman, PA-C  insulin regular human CONCENTRATED (HUMULIN R U-500 KWIKPEN) 500 UNIT/ML KwikPen 100 UNITS, 30 MINUTES BEFORE [employer] MEAL 08/16/22   Reather Littler, MD  Insulin Syringe-Needle U-100 (EASY TOUCH INSULIN SYRINGE) 31G X 5/16" 1 ML MISC USE AS DIRECTED THREE TIMES A DAY 10/12/18   Romero Belling, MD  isosorbide-hydrALAZINE (BIDIL) 20-37.5 MG tablet TAKE TWO (2) TABLETS BY MOUTH THREE TIMES DAILY Patient taking differently: Take 2 tablets by mouth 3 (three) times daily. 10/21/21   Cantwell, Celeste C, PA-C  JARDIANCE 10 MG TABS tablet TAKE 1 TABLET (10 MG TOTAL) BY MOUTH DAILY WITH BREAKFAST. 08/18/22   Reather Littler, MD  Lancets [facility] ULTRASOFT) lancets Test blood sugar 4 times a day 03/30/22   Reather Littler, MD  levothyroxine (SYNTHROID) 50 MCG tablet Take 1 tablet (50 mcg total) by mouth daily at 6 (six) AM. 08/30/19   Marguerita Merles Latif, DO  megestrol (MEGACE) 40 MG tablet Take 40 mg by mouth daily. 01/30/15   [provider]  metFORMIN (GLUCOPHAGE-XR) 500 MG 24 hr tablet Take 2 tablets (1,000 mg total) by mouth 2 (two) times daily. 02/02/21   Romero Belling, MD  Multiple Vitamin (MULTIVITAMIN WITH MINERALS) TABS tablet Take 1 tablet by mouth daily. 08/30/19   Marguerita Merles Latif, DO  ondansetron (ZOFRAN-ODT) 4 MG disintegrating tablet Take 1 tablet (4 mg total) by mouth every 8 (eight) hours as needed for nausea or vomiting. 10/14/22   Roxy Horseman, PA-C  oxybutynin (DITROPAN-XL) 5 MG 24 hr tablet Take 5 mg by mouth daily. 04/25/22   [provider]  pregabalin (LYRICA) 75 MG capsule Take 75 mg 3 (three) times daily by mouth.  01/28/15   [provider]  prochlorperazine (COMPAZINE) 10 MG tablet Take 1 tablet (10 mg total) by mouth every 6 (six) hours as needed for nausea or vomiting. 05/14/22   Osvaldo Shipper, MD  promethazine (PHENERGAN) 25 MG tablet  Take 1 tablet (25 mg total) by mouth every 8 (eight) hours as needed for nausea or vomiting. 10/14/22   Roxy Horseman, PA-C  QUEtiapine (SEROQUEL) 300 MG tablet Take 300 mg by mouth at bedtime. 01/08/21   [provider]  rosuvastatin (CRESTOR) 20 MG tablet Take 1 tablet (20 mg total) by mouth daily. 06/09/21 09/21/22  Cantwell, Celeste C, PA-C  Semaglutide, 2 MG/DOSE, 8 MG/3ML SOPN Inject 2 mg as directed once a week. 08/18/22   Reather Littler, MD  spironolactone (ALDACTONE) 50 MG tablet TAKE 1 TABLET BY MOUTH EVERY DAY Patient taking differently: Take 50 mg by mouth daily. 02/04/22   Yates Decamp, MD  sucralfate (CARAFATE)  1 GM/10ML suspension Take 10 mLs by mouth 2 (two) times daily as needed (abdomen pain). 12/09/20   [provider]  topiramate (TOPAMAX) 25 MG tablet Take 75 mg by mouth at bedtime. 03/17/16   [provider]  torsemide (DEMADEX) 20 MG tablet Take 1 tablet (20 mg total) by mouth at bedtime. 06/09/21   Cantwell, Celeste C, PA-C  TRANSDERM-SCOP 1 MG/3DAYS Place 1 patch onto the skin every 3 (three) days. As needed for nausea 12/22/21   [provider]  VICTOZA 18 MG/3ML SOPN Inject 0.6 mg into the skin. 06/11/22   [provider]  Vitamin D, Ergocalciferol, (DRISDOL) 1.25 MG (50000 UNIT) CAPS capsule Take 50,000 Units by mouth once a week. 09/20/22   [provider]      Allergies    Metoclopramide, Codeine, Hydrocodone, Penicillins, and Percocet [oxycodone-acetaminophen]    Review of Systems   Review of Systems  Constitutional:  Negative for chills and fever.  Respiratory:  Negative for shortness of breath.   Cardiovascular:  Positive for chest pain.  Gastrointestinal:  Positive for abdominal pain, nausea and vomiting.  Genitourinary:  Negative for dysuria.  Neurological:  Negative for light-headedness.  All other systems reviewed and are negative.   Physical Exam Updated Vital Signs BP (!) 150/97   Pulse 100   Resp (!) 21    Ht 5\' 8"  (1.727 m)   Wt (!) 149.7 kg   LMP 03/24/2017   SpO2 100%   BMI 50.18 kg/m  Physical Exam Vitals and nursing note reviewed.  Constitutional:      General: She is not in acute distress.    Appearance: Normal appearance. She is obese. She is not ill-appearing.     Comments: Uncomfortable appearing  HENT:     Head: Normocephalic and atraumatic.     Nose: Nose normal.  Eyes:     General: No scleral icterus.    Extraocular Movements: Extraocular movements intact.     Conjunctiva/sclera: Conjunctivae normal.  Cardiovascular:     Rate and Rhythm: Normal rate and regular rhythm.     Pulses: Normal pulses.  Pulmonary:     Effort: Pulmonary effort is normal. No respiratory distress.     Breath sounds: Normal breath sounds. No wheezing or rales.  Abdominal:     General: There is no distension.     Palpations: Abdomen is soft.     Tenderness: There is abdominal tenderness. There is no guarding.  Musculoskeletal:        General: Normal range of motion.     Cervical back: Normal range of motion.     Right lower leg: No edema.     Left lower leg: No edema.  Skin:    General: Skin is warm and dry.  Neurological:     General: No focal deficit present.     Mental Status: She is alert. Mental status is at baseline.     ED Results / Procedures / Treatments   Labs (all labs ordered are listed, but only abnormal results are displayed) Labs Reviewed  CBC WITH DIFFERENTIAL/PLATELET  COMPREHENSIVE METABOLIC PANEL  LIPASE, BLOOD  URINALYSIS, ROUTINE W REFLEX MICROSCOPIC  TROPONIN I (HIGH SENSITIVITY)    EKG EKG Interpretation  Date/Time:  Saturday October 15 2022 10:10:42 EDT Ventricular Rate:  94 PR Interval:  146 QRS Duration: 101 QT Interval:  456 QTC Calculation: 571 R Axis:   7 Text Interpretation: Sinus rhythm Low voltage, precordial leads Borderline T abnormalities, diffuse leads qt manually  calculated normal No significant change since last tracing Confirmed by  Melene Plan [id number]) on 10/15/2022 10:38:33 AM  Radiology CT ABDOMEN PELVIS W CONTRAST  Result Date: 10/14/2022 CLINICAL DATA:  Substernal chest discomfort. Periumbilical abdominal pain, nausea, and vomiting. EXAM: CT ABDOMEN AND PELVIS WITH CONTRAST TECHNIQUE: Multidetector CT imaging of the abdomen and pelvis was performed using the standard protocol following bolus administration of intravenous contrast. RADIATION DOSE REDUCTION: This exam was performed according to the departmental dose-optimization program which includes automated exposure control, adjustment of the mA and/or kV according to patient size and/or use of iterative reconstruction technique. CONTRAST:  OMNIPAQUE IOHEXOL 350 MG/ML SOLN COMPARISON:  05/09/2022. FINDINGS: Lower chest: The heart is enlarged. Mild atelectasis is noted at the lung bases. Hepatobiliary: A subcentimeter hypodensity is present in the anterior right lobe of the liver which is too small to further characterize. No biliary ductal dilatation. The gallbladder is without stones. Pancreas: Unremarkable. No pancreatic ductal dilatation or surrounding inflammatory changes. Spleen: Normal in size without focal abnormality. Adrenals/Urinary Tract: No adrenal nodule or mass. The kidneys enhance symmetrically. A nonobstructive renal calculus is noted on the right. No hydronephrosis bilaterally the bladder is unremarkable. Stomach/Bowel: There is a small hiatal hernia. Stomach is within normal limits. Appendix appears normal. No evidence of bowel wall thickening, distention, or inflammatory changes. No free air or pneumatosis. Vascular/Lymphatic: No significant vascular findings are present. No enlarged abdominal or pelvic lymph nodes. Reproductive: The uterus is within normal limits. There is a 7 mm fat attenuation lesion in the left adnexa, possible dermoid. No adnexal mass on the right. Other: No abdominopelvic ascites. Musculoskeletal: Mild degenerative changes in the lumbar  spine. No acute or suspicious osseous abnormality. IMPRESSION: 1. No acute intra-abdominal process. 2. Small hiatal hernia. Electronically Signed   By: Thornell Sartorius M.D.   On: 10/14/2022 04:41   DG Chest Port 1 View  Result Date: 10/14/2022 CLINICAL DATA:  Shortness of breath EXAM: PORTABLE CHEST 1 VIEW COMPARISON:  05/09/2022 FINDINGS: The heart size and mediastinal contours are within normal limits. Both lungs are clear. The visualized skeletal structures are unremarkable. IMPRESSION: No active disease. Electronically Signed   By: Deatra Robinson M.D.   On: 10/14/2022 01:16    Procedures Procedures    Medications Ordered in ED Medications  lactated ringers bolus 1,000 mL (has no administration in time range)  droperidol (INAPSINE) 2.5 MG/ML injection 1.25 mg (has no administration in time range)    ED Course/ Medical Decision Making/ A&P Clinical Course as of 10/15/22 1512  Sat Oct 15, 2022  1320 Reevaluation patient reports pain is still at a 8/10.  Still nauseous.  Will provide nausea, and pain medication and then p.o. challenge.  If she fails p.o. challenge or does not have significant improvement in pain will consider admission given this is her third visit in 3 days. [AA]    Clinical Course User Index [AA] Marita Kansas, PA-C                             Medical Decision Making Amount and/or Complexity of Data Reviewed Labs: ordered. Radiology: ordered.  Risk Prescription drug management. Decision regarding hospitalization.   Medical Decision Making / ED Course   This patient presents to the ED for concern of persistent abdominal pain, nausea, vomiting, this involves an extensive number of treatment options, and is a complaint that carries with it a high risk of complications and morbidity.  The differential diagnosis includes gastroparesis flareup, pancreatitis, colitis, diverticulitis  MDM: 52 year old female presents today for evaluation of recurrent gastroparesis  flareup.  This is her third visit in 3 days.  She states she had transient relief after discharge yesterday but overnight her symptoms started again.  She is tearful on exam.  Also endorses some chest pain.  Will obtain troponin, chest x-ray.  CBC shows mild leukocytosis, hemoglobin of 10.6.  Otherwise without acute findings.  CMP shows preserved renal function, glucose of 226.  Lipase within normal limits.  Troponin negative.  UA  shows large leukocytes, 11-20 WBCs.  Denies dysuria.  Low suspicion for UTI.  Will place urine culture order.   Symptoms uncontrolled.  Will discuss with  admitting team for admission given she has had 3 visits in 3 days for uncontrolled symptoms and poor control in the emergency department following multiple rounds of pain medication.  Patient at the my shift is awaiting callback from admitting team.  Signed out to oncoming provider.   Lab Tests: -I ordered, reviewed, and interpreted labs.   The pertinent results include:   Labs Reviewed  CBC WITH DIFFERENTIAL/PLATELET - Abnormal; Notable for the following components:      Result Value   WBC 14.4 (*)    RBC 3.62 (*)    Hemoglobin 10.6 (*)    HCT 33.4 (*)    Neutro Abs 11.5 (*)    Monocytes Absolute 1.2 (*)    Abs Immature Granulocytes 0.08 (*)    All other components within normal limits  COMPREHENSIVE METABOLIC PANEL - Abnormal; Notable for the following components:   CO2 18 (*)    Glucose, Bld 226 (*)    Calcium 8.7 (*)    Total Protein 6.3 (*)    Albumin 3.1 (*)    AST 13 (*)    All other components within normal limits  URINALYSIS, ROUTINE W REFLEX MICROSCOPIC - Abnormal; Notable for the following components:   APPearance HAZY (*)    Glucose, UA >=500 (*)    Ketones, ur 20 (*)    Leukocytes,Ua LARGE (*)    Bacteria, UA RARE (*)    All other components within normal limits  LIPASE, BLOOD  TROPONIN I (HIGH SENSITIVITY)  TROPONIN I (HIGH SENSITIVITY)      EKG  EKG  Interpretation  Date/Time:  Saturday October 15 2022 10:10:42 EDT Ventricular Rate:  94 PR Interval:  146 QRS Duration: 101 QT Interval:  456 QTC Calculation: 571 R Axis:   7 Text Interpretation: Sinus rhythm Low voltage, precordial leads Borderline T abnormalities, diffuse leads qt manually calculated normal No significant change since last tracing Confirmed by Melene Plan [phone]) on 10/15/2022 10:38:33 AM         Imaging Studies ordered: I ordered imaging studies including cxr I independently visualized and interpreted imaging. I agree with the radiologist interpretation   Medicines ordered and prescription drug management: Meds ordered this encounter  Medications   lactated ringers bolus 1,000 mL   droperidol (INAPSINE) 2.5 MG/ML injection 1.25 mg   HYDROmorphone (DILAUDID) injection 1 mg   HYDROmorphone (DILAUDID) injection 1 mg   ondansetron (ZOFRAN) injection 4 mg    -I have reviewed the patients home medicines and have made adjustments as needed  Critical interventions Pain control and fluid bolus   Reevaluation: After the interventions noted above, I reevaluated the patient and found that they have :stayed the same  Co morbidities that complicate the patient evaluation  Past Medical History:  Diagnosis Date   Acute renal failure (HCC)     Intractable nausea vomiting secondary to diabetic gastroparesis causing dehydration and acute renal failure /notes 04/01/2013   Anginal pain (HCC)    Anxiety    Bell's palsy 08/09/2010   Bicornuate uterus    CAP (community acquired pneumonia) 10/2011   Hattie Perch 11/08/2011   Chest pain 01/13/2014   CHF (congestive heart failure) (HCC)    Diabetic gastroparesis associated with type 2 diabetes mellitus (HCC)    this is presumed diagnoses, not confirmed by any studies.    Eczema    Family history of breast cancer    Family history of malignant neoplasm of breast    Family history of malignant neoplasm of ovary    Family history  of ovarian cancer    Family history of prostate cancer    GERD (gastroesophageal reflux disease)    High cholesterol    Hypertension    Liver lesion 08/13/2019   Liver mass 2014   biopsied 03/2013 at [facility], not malignant.  is to undergo radiologic ablation of the mass in sept/October 2014.    Lupus (HCC)    Migraines    "maybe a couple times/yr" (04/01/2013)   Obesity    Obstructive sleep apnea on CPAP 2011   Oxygen at nights   SJOGREN'S SYNDROME 08/09/2010   SLE (systemic lupus erythematosus) (HCC)    Hattie Perch 12/01/2010      Dispostion: Signout to oncoming provider to discussed with admitting team.  Final Clinical Impression(s) / ED Diagnoses Final diagnoses:  Generalized abdominal pain  Gastroparesis    Rx / DC Orders ED Discharge Orders     None         Marita Kansas, PA-C 10/15/22 1538    Melene Plan, DO 10/16/22 [phone]

## 2022-10-15 NOTE — H&P (Signed)
History and Physical   PAVANI PENNOYER [id number] DOB: [birth year] DOA: 10/15/2022  PCP: Bartholome Bill, MD   Patient coming from: Home  Chief Complaint: Nausea vomiting  HPI: [name] is a 52 y.o. female with medical history significant of diabetes with gastroparesis, history of PE, history of CVA, OHS, obesity, hypertension, hyperlipidemia, Bell's palsy, lupus, Sjogren's, anemia, depression, chronic systolic CHF presenting with recurrent nausea and vomiting and abdominal pain.  Patient has had ongoing nausea and vomiting for the past several days.  She has had 3 ED visits in the last 3 days.  Yesterday she was seen and received a dose of droperidol with proven symptoms and she was discharged home.  She was doing okay initially and then her symptoms returned overnight.  Also reports some ongoing chest pain that worsened today, tenderness to palpation of chest wall.  States she has been unable to keep down her medications for the past couple days.  Denies fevers, chills, shortness of breath, constipation, diarrhea.  ED Course: Vital signs in the ED significant for blood pressure in the [id number] to [id number] systolic, respiratory rate in the teens to 20s.  Lab workup included CMP with bicarb 18, creatinine stable 1.05, glucose 226, calcium 8.7, protein 6.3, albumin 3.1.  CBC with mild leukocytosis to 14.4, hemoglobin stable at 10.6.  Lipase negative, troponin negative x 2.  Urinalysis with glucose, ketones, leukocytes, bacteria.  Urine culture pending.  Chest x-ray and CT abdomen pelvis yesterday without acute abnormality.  Chest x-ray today showed cardiomegaly with mild interstitial opacities possibly representing edema.  EKG today showed QTc prolonged at 571.  Patient received Dilaudid, droperidol, Zofran, liter of IV fluids in the ED.  Review of Systems: As per HPI otherwise all other systems reviewed and are negative.  Past Medical History:  Diagnosis Date   Acute renal failure (HCC)      Intractable nausea vomiting secondary to diabetic gastroparesis causing dehydration and acute renal failure /notes 04/01/2013   Anginal pain ([place])    Anxiety    Bell's palsy 08/09/2010   Bicornuate uterus    CAP (community acquired pneumonia) 10/2011   Archie Endo 11/08/2011   Chest pain 01/13/2014   CHF (congestive heart failure) ([place])    Diabetic gastroparesis associated with type 2 diabetes mellitus ([place])    this is presumed diagnoses, not confirmed by any studies.    Eczema    Family history of breast cancer    Family history of malignant neoplasm of breast    Family history of malignant neoplasm of ovary    Family history of ovarian cancer    Family history of prostate cancer    GERD (gastroesophageal reflux disease)    High cholesterol    Hypertension    Liver lesion 08/13/2019   Liver mass 2014   biopsied 03/2013 at [facility], not malignant.  is to undergo radiologic ablation of the mass in sept/October 2014.    Lupus ([place])    Migraines    "maybe a couple times/yr" (04/01/2013)   Obesity    Obstructive sleep apnea on CPAP 2011   Oxygen at nights   Pulmonary embolism ([place]) 05/21/2021   SJOGREN'S SYNDROME 08/09/2010   SLE (systemic lupus erythematosus) ([place])    Archie Endo 12/01/2010    Past Surgical History:  Procedure Laterality Date   BREAST CYST EXCISION Left 08/2005   epidermoid   CARDIAC CATHETERIZATION  02/07/2012   ECTOPIC PREGNANCY SURGERY  1999   ESOPHAGOGASTRODUODENOSCOPY N/A 02/26/2013   Procedure:  ESOPHAGOGASTRODUODENOSCOPY (EGD);  Surgeon: Irene Shipper, MD;  Location: [facility] ENDOSCOPY;  Service: Endoscopy;  Laterality: N/A;   ESOPHAGOGASTRODUODENOSCOPY N/A 03/02/2015   Procedure: ESOPHAGOGASTRODUODENOSCOPY (EGD);  Surgeon: Milus Banister, MD;  Location: [place];  Service: Endoscopy;  Laterality: N/A;   ESOPHAGOGASTRODUODENOSCOPY N/A 02/23/2021   Procedure: ESOPHAGOGASTRODUODENOSCOPY (EGD);  Surgeon: Wilford Corner, MD;  Location: [place];  Service:  Endoscopy;  Laterality: N/A;  possible dilation   HERNIA REPAIR     LEFT AND RIGHT HEART CATHETERIZATION WITH CORONARY ANGIOGRAM N/A 02/07/2012   Procedure: LEFT AND RIGHT HEART CATHETERIZATION WITH CORONARY ANGIOGRAM;  Surgeon: Laverda Page, MD;  Location: [facility] CATH LAB;  Service: Cardiovascular;  Laterality: N/A;   LIVER BIOPSY  03/2013   liver mass/medical hx noted above   MUSCLE BIOPSY     for lupus/notes [id number]   UMBILICAL HERNIA REPAIR  1980's    Social History  reports that she quit smoking about 31 years ago. Her smoking use included cigarettes. She has a 1.50 pack-year smoking history. She has never used smokeless tobacco. She reports that she does not drink alcohol and does not use drugs.  Allergies  Allergen Reactions   Metoclopramide Other (See Comments)    Developed restless leg, akathisia type limb movements.    Codeine Itching, Rash and Other (See Comments)   Hydrocodone Rash   Penicillins Itching    Tolerated Unasyn 02/01/2020   Percocet [Oxycodone-Acetaminophen] Hives and Rash    Tolerates dilaudid Tolerates acetaminophen     Family History  Problem Relation Age of Onset   Diabetes Mother    Asthma Mother    Urticaria Mother    Hypertension Mother    Stroke Mother    Ovarian cancer Sister 36       maternal half-sister; RAD51C positive   Asthma Brother    Asthma Brother    Breast cancer Maternal Aunt 60       currently 29   Cancer Maternal Aunt        unk. primary   Cancer Maternal Aunt        unk. primary   Ovarian cancer Maternal Aunt        deceased 37s   Breast cancer Maternal Aunt        deceased 89s   Breast cancer Maternal Aunt    Stomach cancer Maternal Uncle        dx 48s; deceased   Prostate cancer Maternal Uncle        deceased 35s   Breast cancer Cousin        deceased 56s; daughter of mat uncle with stomach ca   Hypertension Other    Stroke Other    Arthritis Other    Allergic rhinitis Neg Hx    Angioedema Neg Hx   Reviewed  on admission  Prior to Admission medications   Medication Sig Start Date End Date Taking? Authorizing Provider  ADVAIR HFA 230-21 MCG/ACT inhaler Inhale 1-2 puffs into the lungs 2 (two) times daily. 12/20/20   [provider]  albuterol (PROVENTIL) (2.5 MG/3ML) 0.083% nebulizer solution Take 2.5 mg by nebulization every 6 (six) hours as needed for wheezing.    [provider]  ALPRAZolam Duanne Moron) 0.5 MG tablet Take 0.5 mg 2 (two) times daily as needed by mouth for anxiety or sleep.  04/19/17   [provider]  amLODipine (NORVASC) 10 MG tablet Take 10 mg by mouth daily.    [provider]  apixaban (ELIQUIS) 2.5 MG TABS  tablet Take 1 tablet (2.5 mg total) by mouth 2 (two) times daily. 09/21/22   Adrian Prows, MD  Azathioprine 75 MG TABS Take 150 mg by mouth 2 (two) times daily. 02/04/21   [provider]  B-D UF III MINI PEN NEEDLES 31G X 5 MM MISC USE AS DIRECTED TWICE A DAY 10/31/18   Renato Shin, MD  Belimumab (BENLYSTA) 200 MG/ML SOAJ Inject 200 mg into the skin once a week. Thursdays 12/19/19   [provider]  Blood Glucose Monitoring Suppl (ONE TOUCH ULTRA 2) w/Device KIT Check blood sugar daily 03/30/22   Elayne Snare, MD  buPROPion (WELLBUTRIN XL) 300 MG 24 hr tablet Take 300 mg by mouth daily. 03/14/22   [provider]  carvedilol (COREG) 25 MG tablet Take 1.5 tablets (37.5 mg total) by mouth 2 (two) times daily with a meal. 01/01/21   Adrian Prows, MD  Continuous Blood Gluc Sensor (FREESTYLE LIBRE 3 SENSOR) MISC 1 Device by Does not apply route every 14 (fourteen) days. Apply 1 sensor on upper arm every 14 days for continuous glucose monitoring 05/25/22   Elayne Snare, MD  cromolyn (OPTICROM) 4 % ophthalmic solution Place 1 drop into both eyes 2 (two) times daily. 01/14/21   [provider]  cycloSPORINE (RESTASIS) 0.05 % ophthalmic emulsion Place 1 drop into both eyes in the morning, at noon, and at bedtime.     [provider]  diclofenac Sodium (VOLTAREN) 1 % GEL APPLY 2 GRAMS TO AFFECTED AREA 4 TIMES A DAY Patient taking differently: Apply 2 g topically 4 (four) times daily as needed (pain). 01/11/22   Cantwell, Celeste C, PA-C  EASY TOUCH PEN NEEDLES 32G X 4 MM MISC USE TO INJECT INSULIN TWICE DAILY 08/07/19   Renato Shin, MD  ENTRESTO 97-103 MG TAKE 1 TABLET BY MOUTH TWICE A DAY 04/18/22   Adrian Prows, MD  famotidine (PEPCID) 20 MG tablet Take 20 mg by mouth 2 (two) times daily.    [provider]  fluticasone (FLONASE) 50 MCG/ACT nasal spray Place 2 sprays into both nostrils continuous as needed for allergies or rhinitis.    [provider]  glucose blood (ACCU-CHEK GUIDE) test strip 1 each by Other route 2 (two) times daily. And lancets 2/day 06/18/21   Renato Shin, MD  glucose blood (ACCU-CHEK GUIDE) test strip USE TO TEST BLOOD SUGAR 4 TIMES DAILY 09/13/22   Elayne Snare, MD  glucose blood (ONETOUCH ULTRA) test strip Test blood sugar 4 times a day 03/30/22   Elayne Snare, MD  hydroxychloroquine (PLAQUENIL) 200 MG tablet Take 200 mg by mouth 2 (two) times daily.    [provider]  ibuprofen (ADVIL) 600 MG tablet Take 1 tablet (600 mg total) by mouth every 6 (six) hours as needed. 10/14/22   Montine Circle, PA-C  insulin regular human CONCENTRATED (HUMULIN R U-500 KWIKPEN) 500 UNIT/ML KwikPen 100 UNITS, 30 MINUTES BEFORE [facility] MEAL 08/16/22   Elayne Snare, MD  Insulin Syringe-Needle U-100 (EASY TOUCH INSULIN SYRINGE) 31G X 5/16" 1 ML MISC USE AS DIRECTED THREE TIMES A DAY 10/12/18   Renato Shin, MD  isosorbide-hydrALAZINE (BIDIL) 20-37.5 MG tablet TAKE TWO (2) TABLETS BY MOUTH THREE TIMES DAILY Patient taking differently: Take 2 tablets by mouth 3 (three) times daily. 10/21/21   Cantwell, Celeste C, PA-C  JARDIANCE 10 MG TABS tablet TAKE 1 TABLET (10 MG TOTAL) BY MOUTH DAILY WITH BREAKFAST. 08/18/22   Elayne Snare, MD  Lancets [facility] ULTRASOFT) lancets Test blood  sugar 4 times a day 03/30/22    Elayne Snare, MD  levothyroxine (SYNTHROID) 50 MCG tablet Take 1 tablet (50 mcg total) by mouth daily at 6 (six) AM. 08/30/19   Raiford Noble Latif, DO  megestrol (MEGACE) 40 MG tablet Take 40 mg by mouth daily. 01/30/15   [provider]  metFORMIN (GLUCOPHAGE-XR) 500 MG 24 hr tablet Take 2 tablets (1,000 mg total) by mouth 2 (two) times daily. 02/02/21   Renato Shin, MD  Multiple Vitamin (MULTIVITAMIN WITH MINERALS) TABS tablet Take 1 tablet by mouth daily. 08/30/19   Raiford Noble Latif, DO  ondansetron (ZOFRAN-ODT) 4 MG disintegrating tablet Take 1 tablet (4 mg total) by mouth every 8 (eight) hours as needed for nausea or vomiting. 10/14/22   Montine Circle, PA-C  oxybutynin (DITROPAN-XL) 5 MG 24 hr tablet Take 5 mg by mouth daily. 04/25/22   [provider]  pregabalin (LYRICA) 75 MG capsule Take 75 mg 3 (three) times daily by mouth.  01/28/15   [provider]  prochlorperazine (COMPAZINE) 10 MG tablet Take 1 tablet (10 mg total) by mouth every 6 (six) hours as needed for nausea or vomiting. 05/14/22   Bonnielee Haff, MD  promethazine (PHENERGAN) 25 MG tablet Take 1 tablet (25 mg total) by mouth every 8 (eight) hours as needed for nausea or vomiting. 10/14/22   Montine Circle, PA-C  QUEtiapine (SEROQUEL) 300 MG tablet Take 300 mg by mouth at bedtime. 01/08/21   [provider]  rosuvastatin (CRESTOR) 20 MG tablet Take 1 tablet (20 mg total) by mouth daily. 06/09/21 09/21/22  Cantwell, Celeste C, PA-C  Semaglutide, 2 MG/DOSE, 8 MG/3ML SOPN Inject 2 mg as directed once a week. 08/18/22   Elayne Snare, MD  spironolactone (ALDACTONE) 50 MG tablet TAKE 1 TABLET BY MOUTH EVERY DAY Patient taking differently: Take 50 mg by mouth daily. 02/04/22   Adrian Prows, MD  sucralfate (CARAFATE) 1 GM/10ML suspension Take 10 mLs by mouth 2 (two) times daily as needed (abdomen pain). 12/09/20   [provider]  topiramate (TOPAMAX) 25 MG tablet Take 75 mg by mouth at bedtime.  03/17/16   [provider]  torsemide (DEMADEX) 20 MG tablet Take 1 tablet (20 mg total) by mouth at bedtime. 06/09/21   Cantwell, Celeste C, PA-C  TRANSDERM-SCOP 1 MG/3DAYS Place 1 patch onto the skin every 3 (three) days. As needed for nausea 12/22/21   [provider]  VICTOZA 18 MG/3ML SOPN Inject 0.6 mg into the skin. 06/11/22   [provider]  Vitamin D, Ergocalciferol, (DRISDOL) 1.25 MG (50000 UNIT) CAPS capsule Take 50,000 Units by mouth once a week. 09/20/22   [provider]    Physical Exam: Vitals:   10/15/22 1445 10/15/22 1500 10/15/22 1515 10/15/22 1530  BP: 134/85 (!) 145/81 123/63 123/73  Pulse:  100    Resp: (!) 9 10 12 11   Temp:      TempSrc:      SpO2:  100%    Weight:      Height:        Physical Exam Constitutional:      General: She is not in acute distress.    Appearance: Normal appearance. She is obese.  HENT:     Head: Normocephalic and atraumatic.     Mouth/Throat:     Mouth: Mucous membranes are moist.     Pharynx: Oropharynx is clear.  Eyes:     Extraocular Movements: Extraocular movements intact.  Pupils: Pupils are equal, round, and reactive to light.  Cardiovascular:     Rate and Rhythm: Normal rate and regular rhythm.     Pulses: Normal pulses.     Heart sounds: Normal heart sounds.     Comments: Distant heart sounds Pulmonary:     Effort: Pulmonary effort is normal. No respiratory distress.     Breath sounds: Normal breath sounds.     Comments: Distant breath sounds Abdominal:     General: Bowel sounds are normal. There is no distension.     Palpations: Abdomen is soft.     Tenderness: There is no abdominal tenderness.  Musculoskeletal:        General: No swelling or deformity.     Comments: Chest wall tender to palpation.  Skin:    General: Skin is warm and dry.  Neurological:     General: No focal deficit present.     Mental Status: Mental status is at baseline.    Labs on Admission: I have  personally reviewed following labs and imaging studies  CBC: Recent Labs  Lab 10/14/22 0004 10/15/22 1118  WBC 15.9* 14.4*  NEUTROABS  --  11.5*  HGB 12.0 10.6*  HCT 37.5 33.4*  MCV 90.1 92.3  PLT 294 [id number]    Basic Metabolic Panel: Recent Labs  Lab 10/14/22 0004 10/15/22 1118  NA 140 137  K 3.9 3.8  CL 108 107  CO2 19* 18*  GLUCOSE 212* 226*  BUN 15 16  CREATININE 1.05* 0.93  CALCIUM 9.7 8.7*    GFR: Estimated Creatinine Clearance: 109.7 mL/min (by C-G formula based on SCr of 0.93 mg/dL).  Liver Function Tests: Recent Labs  Lab 10/14/22 0004 10/15/22 1118  AST 15 13*  ALT 10 9  ALKPHOS 101 82  BILITOT 1.1 0.8  PROT 7.2 6.3*  ALBUMIN 3.7 3.1*    Urine analysis:    Component Value Date/Time   COLORURINE YELLOW 10/15/2022 1032   APPEARANCEUR HAZY (A) 10/15/2022 1032   LABSPEC 1.030 10/15/2022 1032   PHURINE 5.0 10/15/2022 1032   GLUCOSEU >=500 (A) 10/15/2022 1032   HGBUR NEGATIVE 10/15/2022 1032   BILIRUBINUR NEGATIVE 10/15/2022 1032   KETONESUR 20 (A) 10/15/2022 1032   PROTEINUR NEGATIVE 10/15/2022 1032   UROBILINOGEN 0.2 02/27/2015 1733   NITRITE NEGATIVE 10/15/2022 1032   LEUKOCYTESUR LARGE (A) 10/15/2022 1032    Radiological Exams on Admission: DG Chest Portable 1 View  Result Date: 10/15/2022 CLINICAL DATA:  Chest pain EXAM: PORTABLE CHEST 1 VIEW COMPARISON:  Radiograph 10/14/2022 FINDINGS: Unchanged cardiomegaly. There are mild lower lung predominant interstitial opacities. No large effusion or evidence of pneumothorax. Bones are unchanged. IMPRESSION: Unchanged cardiomegaly. Mild lower lung predominant interstitial opacities, could reflect mild interstitial edema. Electronically Signed   By: Maurine Simmering M.D.   On: 10/15/2022 11:07   CT ABDOMEN PELVIS W CONTRAST  Result Date: 10/14/2022 CLINICAL DATA:  Substernal chest discomfort. Periumbilical abdominal pain, nausea, and vomiting. EXAM: CT ABDOMEN AND PELVIS WITH CONTRAST TECHNIQUE:  Multidetector CT imaging of the abdomen and pelvis was performed using the standard protocol following bolus administration of intravenous contrast. RADIATION DOSE REDUCTION: This exam was performed according to the departmental dose-optimization program which includes automated exposure control, adjustment of the mA and/or kV according to patient size and/or use of iterative reconstruction technique. CONTRAST:  183mL OMNIPAQUE IOHEXOL 350 MG/ML SOLN COMPARISON:  05/09/2022. FINDINGS: Lower chest: The heart is enlarged. Mild atelectasis is noted at the lung bases. Hepatobiliary: A subcentimeter hypodensity  is present in the anterior right lobe of the liver which is too small to further characterize. No biliary ductal dilatation. The gallbladder is without stones. Pancreas: Unremarkable. No pancreatic ductal dilatation or surrounding inflammatory changes. Spleen: Normal in size without focal abnormality. Adrenals/Urinary Tract: No adrenal nodule or mass. The kidneys enhance symmetrically. A nonobstructive renal calculus is noted on the right. No hydronephrosis bilaterally the bladder is unremarkable. Stomach/Bowel: There is a small hiatal hernia. Stomach is within normal limits. Appendix appears normal. No evidence of bowel wall thickening, distention, or inflammatory changes. No free air or pneumatosis. Vascular/Lymphatic: No significant vascular findings are present. No enlarged abdominal or pelvic lymph nodes. Reproductive: The uterus is within normal limits. There is a 7 mm fat attenuation lesion in the left adnexa, possible dermoid. No adnexal mass on the right. Other: No abdominopelvic ascites. Musculoskeletal: Mild degenerative changes in the lumbar spine. No acute or suspicious osseous abnormality. IMPRESSION: 1. No acute intra-abdominal process. 2. Small hiatal hernia. Electronically Signed   By: Brett Fairy M.D.   On: 10/14/2022 04:41   DG Chest Port 1 View  Result Date: 10/14/2022 CLINICAL DATA:   Shortness of breath EXAM: PORTABLE CHEST 1 VIEW COMPARISON:  05/09/2022 FINDINGS: The heart size and mediastinal contours are within normal limits. Both lungs are clear. The visualized skeletal structures are unremarkable. IMPRESSION: No active disease. Electronically Signed   By: Ulyses Jarred M.D.   On: 10/14/2022 01:16    EKG: Independently reviewed.  Sinus rhythm at 94 bpm.  Nonspecific T wave changes.  Low voltage multiple leads.  QT 456 with QTc calculated at 571.  There is a notation by EDP of 9 QTc manually calculated as normal.  Assessment/Plan Principal Problem:   Intractable nausea and vomiting Active Problems:   SJOGREN'S SYNDROME   Chronic diastolic CHF (congestive heart failure) (HCC)   Morbid obesity (HCC)   Diabetic gastroparesis (HCC)   HLD (hyperlipidemia)   Type 2 diabetes mellitus with hyperlipidemia (HCC)   Obesity hypoventilation syndrome (HCC)   Essential hypertension   History of pulmonary embolism   Normocytic anemia   History of CVA (cerebrovascular accident)   Major depressive disorder with single episode, in partial remission (HCC)   SLE (systemic lupus erythematosus related syndrome) (HCC)   Intractable nausea and vomiting Gastroparesis Prolonged QTc > Patient presenting with recurrent abdominal pain with intractable nausea vomiting in the setting of gastroparesis secondary to diabetes. > Multiple visits in the last few days with some improvement with droperidol in the setting of adverse reaction to metoclopramide. > Symptoms continue to recur.  Patient to be admitted for symptom management. > Patient does have EKG reading prolonged QTc.  There is a notation by ED provider reporting manually calculated QTc is being okay.  Will get repeat EKG and start with steroids and Ativan as needed for now. > Received droperidol and Zofran in the ED as well as IV fluids. - Monitor on telemetry - Repeat EKG and check EKG in a.m. - Hold off on QTc prolonging antiemetics  for now, if this is returned to normal consider droperidol 10 mg 3 times daily versus erythromycin 3 mg/kg every 8 hours - 4 mg dexamethasone - 0.5 mg Ativan every 6 hours as needed  Diabetes - SSI  History of PE - Continue home Eliquis   Obesity OHS - Noted  Hypertension -Resume home Coreg, Entresto, torsemide - Continue to hold home amlodipine, spironolactone, BiDil   Hyperlipidemia - Continue home rosuvastatin  History of stroke -  Continue home rosuvastatin, Eliquis  Lupus Sjogren's - Continue home hydroxychloroquine - Continue home Lyrica - Is on belimumab injections outpatient  Anemia > Hemoglobin stable at 10.6 in ED - Trend CBC  Depression - Continue home Seroquel, bupropion - Hold as needed Xanax while getting as needed Ativan as above  Chronic combined systolic and diastolic CHF > Echo in [id number] showed EF of 30-35% with G1 DD and severely reduced RV function.  There is a outpatient cardiology note that notes improved EF in 2023 but I cannot see this echo. > Mildly elevated BNP yesterday at 102.  Chest x-ray with some cardiomegaly and mild changes that could be consistent with edema. > Did receive fluids in the ED in the setting of nausea vomiting. > Creatinine is stable we will continue with home regimen for now but consider IV dose of diuresis if concern for worsening volume overload.  On medications for couple days. - Monitoring on telemetry as above - Resume home torsemide, Entresto, carvedilol - Continue to hold home spironolactone, BiDil   DVT prophylaxis: Eliquis Code Status:   Full Family Communication:  None on admission Disposition Plan:   Patient is from:  Home  Anticipated DC to:  Home  Anticipated DC date:  1 to 2 days  Anticipated DC barriers: None  Consults called:  None Admission status:  Observation, telemetry  Severity of Illness: The appropriate patient status for this patient is OBSERVATION. Observation status is judged to be  reasonable and necessary in order to provide the required intensity of service to ensure the patient's safety. The patient's presenting symptoms, physical exam findings, and initial radiographic and laboratory data in the context of their medical condition is felt to place them at decreased risk for further clinical deterioration. Furthermore, it is anticipated that the patient will be medically stable for discharge from the hospital within 2 midnights of admission.    Marcelyn Bruins MD [facility]  How to contact the [facility] Attending or Consulting provider [place] or covering provider during after hours [place], for this patient?   Check the care team in [facility] and look for a) attending/consulting [facility] provider listed and b) the [facility] team listed Log into [email or web address] and use [place]'s universal password to access. If you do not have the password, please contact the hospital operator. Locate the [facility] provider you are looking for under [facility] and page to a number that you can be directly reached. If you still have difficulty reaching the provider, please page the [facility] (Director on Call) for the Hospitalists listed on amion for assistance.  10/15/2022, 4:11 PM

## 2022-10-15 NOTE — ED Notes (Signed)
ED TO INPATIENT HANDOFF REPORT  ED Nurse Name and Phone #:   Su Grand [phone]  S Name/Age/Gender [name] 52 y.o. female Room/Bed: 040C/040C  Code Status   Code Status: Full Code  Home/SNF/Other Home Patient oriented to: self, place, time, and situation Is this baseline? Yes   Triage Complete: Triage complete  Chief Complaint Intractable nausea and vomiting [R11.2]  Triage Note Patient presents to ed c/o nausea was just seen in the ED earlier and states she isn't feeling any better.    Allergies Allergies  Allergen Reactions   Metoclopramide Other (See Comments)    Developed restless leg, akathisia type limb movements.    Codeine Itching, Rash and Other (See Comments)   Hydrocodone Rash   Penicillins Itching    Tolerated Unasyn 02/01/2020   Percocet [Oxycodone-Acetaminophen] Hives and Rash    Tolerates dilaudid Tolerates acetaminophen     Level of Care/Admitting Diagnosis ED Disposition     ED Disposition  Admit   Condition  --   Comment  Hospital Area: [facility] [[id number]]  Level of Care: Telemetry Medical [104]  May place patient in observation at [facility] or [place] if equivalent level of care is available:: No  Covid Evaluation: Asymptomatic - no recent exposure (last 10 days) testing not required  Diagnosis: Intractable nausea and vomiting [id number]  Admitting Physician: Marcelyn Bruins [id number]  Attending Physician: Marcelyn Bruins [id number]          B Medical/Surgery History Past Medical History:  Diagnosis Date   Acute renal failure ([place])     Intractable nausea vomiting secondary to diabetic gastroparesis causing dehydration and acute renal failure /notes 04/01/2013   Anginal pain ([place])    Anxiety    Bell's palsy 08/09/2010   Bicornuate uterus    CAP (community acquired pneumonia) 10/2011   Archie Endo 11/08/2011   Chest pain 01/13/2014   CHF (congestive heart failure) ([place])    Diabetic gastroparesis  associated with type 2 diabetes mellitus ([place])    this is presumed diagnoses, not confirmed by any studies.    Eczema    Family history of breast cancer    Family history of malignant neoplasm of breast    Family history of malignant neoplasm of ovary    Family history of ovarian cancer    Family history of prostate cancer    GERD (gastroesophageal reflux disease)    High cholesterol    Hypertension    Liver lesion 08/13/2019   Liver mass 2014   biopsied 03/2013 at [facility], not malignant.  is to undergo radiologic ablation of the mass in sept/October 2014.    Lupus ([place])    Migraines    "maybe a couple times/yr" (04/01/2013)   Obesity    Obstructive sleep apnea on CPAP 2011   Oxygen at nights   Pulmonary embolism ([place]) 05/21/2021   SJOGREN'S SYNDROME 08/09/2010   SLE (systemic lupus erythematosus) ([place])    Archie Endo 12/01/2010   Past Surgical History:  Procedure Laterality Date   BREAST CYST EXCISION Left 08/2005   epidermoid   CARDIAC CATHETERIZATION  02/07/2012   ECTOPIC PREGNANCY SURGERY  1999   ESOPHAGOGASTRODUODENOSCOPY N/A 02/26/2013   Procedure: ESOPHAGOGASTRODUODENOSCOPY (EGD);  Surgeon: Irene Shipper, MD;  Location: [facility] ENDOSCOPY;  Service: Endoscopy;  Laterality: N/A;   ESOPHAGOGASTRODUODENOSCOPY N/A 03/02/2015   Procedure: ESOPHAGOGASTRODUODENOSCOPY (EGD);  Surgeon: Milus Banister, MD;  Location: [place];  Service: Endoscopy;  Laterality: N/A;   ESOPHAGOGASTRODUODENOSCOPY N/A 02/23/2021  Procedure: ESOPHAGOGASTRODUODENOSCOPY (EGD);  Surgeon: Wilford Corner, MD;  Location: [place];  Service: Endoscopy;  Laterality: N/A;  possible dilation   HERNIA REPAIR     LEFT AND RIGHT HEART CATHETERIZATION WITH CORONARY ANGIOGRAM N/A 02/07/2012   Procedure: LEFT AND RIGHT HEART CATHETERIZATION WITH CORONARY ANGIOGRAM;  Surgeon: Laverda Page, MD;  Location: [facility] CATH LAB;  Service: Cardiovascular;  Laterality: N/A;   LIVER BIOPSY  03/2013   liver mass/medical hx  noted above   MUSCLE BIOPSY     for lupus/notes [id number]   UMBILICAL HERNIA REPAIR  1980's     A IV Location/Drains/Wounds Patient Lines/Drains/Airways Status     Active Line/Drains/Airways     Name Placement date Placement time Site Days   Peripheral IV 10/15/22 20 G Left Antecubital 10/15/22  --  Antecubital  less than 1            Intake/Output Last 24 hours No intake or output data in the 24 hours ending 10/15/22 1621  Labs/Imaging Results for orders placed or performed during the hospital encounter of 10/15/22 (from the past 48 hour(s))  Urinalysis, Routine w reflex microscopic -Urine, Clean Catch     Status: Abnormal   Collection Time: 10/15/22 10:32 AM  Result Value Ref Range   Color, Urine YELLOW YELLOW   APPearance HAZY (A) CLEAR   Specific Gravity, Urine 1.030 1.005 - 1.030   pH 5.0 5.0 - 8.0   Glucose, UA >=500 (A) NEGATIVE mg/dL   Hgb urine dipstick NEGATIVE NEGATIVE   Bilirubin Urine NEGATIVE NEGATIVE   Ketones, ur 20 (A) NEGATIVE mg/dL   Protein, ur NEGATIVE NEGATIVE mg/dL   Nitrite NEGATIVE NEGATIVE   Leukocytes,Ua LARGE (A) NEGATIVE   RBC / HPF 0-5 0 - 5 RBC/hpf   WBC, UA 11-20 0 - 5 WBC/hpf   Bacteria, UA RARE (A) NONE SEEN   Squamous Epithelial / HPF 6-10 0 - 5 /HPF   Mucus PRESENT     Comment: Performed at [facility], [address], [place], [place] [address]  CBC with Differential     Status: Abnormal   Collection Time: 10/15/22 11:18 AM  Result Value Ref Range   WBC 14.4 (H) 4.0 - 10.5 K/uL   RBC 3.62 (L) 3.87 - 5.11 MIL/uL   Hemoglobin 10.6 (L) 12.0 - 15.0 g/dL   HCT 33.4 (L) 36.0 - 46.0 %   MCV 92.3 80.0 - 100.0 fL   MCH 29.3 26.0 - 34.0 pg   MCHC 31.7 30.0 - 36.0 g/dL   RDW 14.5 11.5 - 15.5 %   Platelets 237 150 - 400 K/uL   nRBC 0.0 0.0 - 0.2 %   Neutrophils Relative % 81 %   Neutro Abs 11.5 (H) 1.7 - 7.7 K/uL   Lymphocytes Relative 10 %   Lymphs Abs 1.5 0.7 - 4.0 K/uL   Monocytes Relative 8 %   Monocytes Absolute 1.2  (H) 0.1 - 1.0 K/uL   Eosinophils Relative 0 %   Eosinophils Absolute 0.1 0.0 - 0.5 K/uL   Basophils Relative 0 %   Basophils Absolute 0.0 0.0 - 0.1 K/uL   Immature Granulocytes 1 %   Abs Immature Granulocytes 0.08 (H) 0.00 - 0.07 K/uL    Comment: Performed at [place] [address] [place], [place] [address]  Comprehensive metabolic panel     Status: Abnormal   Collection Time: 10/15/22 11:18 AM  Result Value Ref Range   Sodium 137 135 - 145  mmol/L   Potassium 3.8 3.5 - 5.1 mmol/L   Chloride 107 98 - 111 mmol/L   CO2 18 (L) 22 - 32 mmol/L   Glucose, Bld 226 (H) 70 - 99 mg/dL    Comment: Glucose reference range applies only to samples taken after fasting for at least 8 hours.   BUN 16 6 - 20 mg/dL   Creatinine, Ser 0.93 0.44 - 1.00 mg/dL   Calcium 8.7 (L) 8.9 - 10.3 mg/dL   Total Protein 6.3 (L) 6.5 - 8.1 g/dL   Albumin 3.1 (L) 3.5 - 5.0 g/dL   AST 13 (L) 15 - 41 U/L   ALT 9 0 - 44 U/L   Alkaline Phosphatase 82 38 - 126 U/L   Total Bilirubin 0.8 0.3 - 1.2 mg/dL   GFR, Estimated >60 >60 mL/min    Comment: (NOTE) Calculated using the CKD-EPI Creatinine Equation (2021)    Anion gap 12 5 - 15    Comment: Performed at [facility], [place] [address], [place], [place] [address]  Lipase, blood     Status: None   Collection Time: 10/15/22 11:18 AM  Result Value Ref Range   Lipase 39 11 - 51 U/L    Comment: Performed at [place] [address], [place], [place] [address]  Troponin I (High Sensitivity)     Status: None   Collection Time: 10/15/22 11:18 AM  Result Value Ref Range   Troponin I (High Sensitivity) 5 <18 ng/L    Comment: (NOTE) Elevated high sensitivity troponin I (hsTnI) values and significant  changes across serial measurements may suggest ACS but many other  chronic and acute conditions are known to elevate hsTnI results.  Refer to the "Links" section for chest pain algorithms and additional  guidance. Performed at [facility],  [place] [address], [place], [place] [address]   Troponin I (High Sensitivity)     Status: None   Collection Time: 10/15/22  2:20 PM  Result Value Ref Range   Troponin I (High Sensitivity) 9 <18 ng/L    Comment: (NOTE) Elevated high sensitivity troponin I (hsTnI) values and significant  changes across serial measurements may suggest ACS but many other  chronic and acute conditions are known to elevate hsTnI results.  Refer to the "Links" section for chest pain algorithms and additional  guidance. Performed at [facility], [place] [address] [place], [place] [address]    DG Chest Portable 1 View  Result Date: 10/15/2022 CLINICAL DATA:  Chest pain EXAM: PORTABLE CHEST 1 VIEW COMPARISON:  Radiograph 10/14/2022 FINDINGS: Unchanged cardiomegaly. There are mild lower lung predominant interstitial opacities. No large effusion or evidence of pneumothorax. Bones are unchanged. IMPRESSION: Unchanged cardiomegaly. Mild lower lung predominant interstitial opacities, could reflect mild interstitial edema. Electronically Signed   By: Maurine Simmering M.D.   On: 10/15/2022 11:07   CT ABDOMEN PELVIS W CONTRAST  Result Date: 10/14/2022 CLINICAL DATA:  Substernal chest discomfort. Periumbilical abdominal pain, nausea, and vomiting. EXAM: CT ABDOMEN AND PELVIS WITH CONTRAST TECHNIQUE: Multidetector CT imaging of the abdomen and pelvis was performed using the standard protocol following bolus administration of intravenous contrast. RADIATION DOSE REDUCTION: This exam was performed according to the departmental dose-optimization program which includes automated exposure control, adjustment of the mA and/or kV according to patient size and/or use of iterative reconstruction technique. CONTRAST:  131mL OMNIPAQUE IOHEXOL 350 MG/ML SOLN COMPARISON:  05/09/2022. FINDINGS: Lower chest: The heart is enlarged. Mild atelectasis is  noted at the lung bases. Hepatobiliary: A subcentimeter hypodensity is present in the anterior right  lobe of the liver which is too small to further characterize. No biliary ductal dilatation. The gallbladder is without stones. Pancreas: Unremarkable. No pancreatic ductal dilatation or surrounding inflammatory changes. Spleen: Normal in size without focal abnormality. Adrenals/Urinary Tract: No adrenal nodule or mass. The kidneys enhance symmetrically. A nonobstructive renal calculus is noted on the right. No hydronephrosis bilaterally the bladder is unremarkable. Stomach/Bowel: There is a small hiatal hernia. Stomach is within normal limits. Appendix appears normal. No evidence of bowel wall thickening, distention, or inflammatory changes. No free air or pneumatosis. Vascular/Lymphatic: No significant vascular findings are present. No enlarged abdominal or pelvic lymph nodes. Reproductive: The uterus is within normal limits. There is a 7 mm fat attenuation lesion in the left adnexa, possible dermoid. No adnexal mass on the right. Other: No abdominopelvic ascites. Musculoskeletal: Mild degenerative changes in the lumbar spine. No acute or suspicious osseous abnormality. IMPRESSION: 1. No acute intra-abdominal process. 2. Small hiatal hernia. Electronically Signed   By: Brett Fairy M.D.   On: 10/14/2022 04:41   DG Chest Port 1 View  Result Date: 10/14/2022 CLINICAL DATA:  Shortness of breath EXAM: PORTABLE CHEST 1 VIEW COMPARISON:  05/09/2022 FINDINGS: The heart size and mediastinal contours are within normal limits. Both lungs are clear. The visualized skeletal structures are unremarkable. IMPRESSION: No active disease. Electronically Signed   By: Ulyses Jarred M.D.   On: 10/14/2022 01:16    Pending Labs Unresulted Labs (From admission, onward)     Start     Ordered   10/16/22 0500  Comprehensive metabolic panel  Tomorrow morning,   R        10/15/22 1609   10/16/22 0500  CBC  Tomorrow morning,   R        10/15/22 1609   10/15/22 1515  Urine Culture  Once,   URGENT       Question:  Indication   Answer:  Urgency/frequency   10/15/22 1514            Vitals/Pain Today's Vitals   10/15/22 1445 10/15/22 1500 10/15/22 1515 10/15/22 1530  BP: 134/85 (!) 145/81 123/63 123/73  Pulse:  100    Resp: (!) 9 10 12 11   Temp:      TempSrc:      SpO2:  100%    Weight:      Height:        Isolation Precautions No active isolations  Medications Medications  hydroxychloroquine (PLAQUENIL) tablet 200 mg (has no administration in time range)  amLODipine (NORVASC) tablet 10 mg (has no administration in time range)  carvedilol (COREG) tablet 37.5 mg (has no administration in time range)  sacubitril-valsartan (ENTRESTO) 97-103 mg per tablet (has no administration in time range)  isosorbide-hydrALAZINE (BIDIL) 20-37.5 MG per tablet 2 tablet (has no administration in time range)  rosuvastatin (CRESTOR) tablet 20 mg (has no administration in time range)  spironolactone (ALDACTONE) tablet 50 mg (has no administration in time range)  torsemide (DEMADEX) tablet 20 mg (has no administration in time range)  levothyroxine (SYNTHROID) tablet 50 mcg (has no administration in time range)  sucralfate (CARAFATE) 1 GM/10ML suspension 1 g (has no administration in time range)  apixaban (ELIQUIS) tablet 2.5 mg (has no administration in time range)  mometasone-formoterol (DULERA) 200-5 MCG/ACT inhaler 2 puff (has no administration in time range)  albuterol (PROVENTIL) (2.5 MG/3ML) 0.083% nebulizer solution 2.5 mg (has no  administration in time range)  sodium chloride flush (NS) 0.9 % injection 3 mL (has no administration in time range)  acetaminophen (TYLENOL) tablet 650 mg (has no administration in time range)    Or  acetaminophen (TYLENOL) suppository 650 mg (has no administration in time range)  polyethylene glycol (MIRALAX / GLYCOLAX) packet 17 g (has no administration in time range)  lactated ringers bolus 1,000 mL (0 mLs Intravenous Stopped 10/15/22 1231)  droperidol (INAPSINE) 2.5 MG/ML injection  1.25 mg (1.25 mg Intravenous Given 10/15/22 1056)  HYDROmorphone (DILAUDID) injection 1 mg (1 mg Intravenous Given 10/15/22 1143)  HYDROmorphone (DILAUDID) injection 1 mg (1 mg Intravenous Given 10/15/22 1420)  ondansetron (ZOFRAN) injection 4 mg (4 mg Intravenous Given 10/15/22 1420)    Mobility walks     Focused Assessments GI   R Recommendations: See Admitting Provider Note  Report given to:   Additional Notes:

## 2022-10-16 DIAGNOSIS — K5909 Other constipation: Secondary | ICD-10-CM | POA: Diagnosis present

## 2022-10-16 DIAGNOSIS — Z79899 Other long term (current) drug therapy: Secondary | ICD-10-CM | POA: Diagnosis not present

## 2022-10-16 DIAGNOSIS — F419 Anxiety disorder, unspecified: Secondary | ICD-10-CM | POA: Diagnosis present

## 2022-10-16 DIAGNOSIS — Z6841 Body Mass Index (BMI) 40.0 and over, adult: Secondary | ICD-10-CM | POA: Diagnosis not present

## 2022-10-16 DIAGNOSIS — M3500 Sicca syndrome, unspecified: Secondary | ICD-10-CM | POA: Diagnosis present

## 2022-10-16 DIAGNOSIS — K449 Diaphragmatic hernia without obstruction or gangrene: Secondary | ICD-10-CM | POA: Diagnosis present

## 2022-10-16 DIAGNOSIS — Z79890 Hormone replacement therapy (postmenopausal): Secondary | ICD-10-CM | POA: Diagnosis not present

## 2022-10-16 DIAGNOSIS — Z7901 Long term (current) use of anticoagulants: Secondary | ICD-10-CM | POA: Diagnosis not present

## 2022-10-16 DIAGNOSIS — I110 Hypertensive heart disease with heart failure: Secondary | ICD-10-CM | POA: Diagnosis present

## 2022-10-16 DIAGNOSIS — Z794 Long term (current) use of insulin: Secondary | ICD-10-CM | POA: Diagnosis not present

## 2022-10-16 DIAGNOSIS — E1169 Type 2 diabetes mellitus with other specified complication: Secondary | ICD-10-CM | POA: Diagnosis present

## 2022-10-16 DIAGNOSIS — R112 Nausea with vomiting, unspecified: Secondary | ICD-10-CM | POA: Diagnosis not present

## 2022-10-16 DIAGNOSIS — K2100 Gastro-esophageal reflux disease with esophagitis, without bleeding: Secondary | ICD-10-CM | POA: Diagnosis present

## 2022-10-16 DIAGNOSIS — E662 Morbid (severe) obesity with alveolar hypoventilation: Secondary | ICD-10-CM | POA: Diagnosis present

## 2022-10-16 DIAGNOSIS — I5042 Chronic combined systolic (congestive) and diastolic (congestive) heart failure: Secondary | ICD-10-CM | POA: Diagnosis present

## 2022-10-16 DIAGNOSIS — D72829 Elevated white blood cell count, unspecified: Secondary | ICD-10-CM | POA: Diagnosis present

## 2022-10-16 DIAGNOSIS — Z7984 Long term (current) use of oral hypoglycemic drugs: Secondary | ICD-10-CM | POA: Diagnosis not present

## 2022-10-16 DIAGNOSIS — E1143 Type 2 diabetes mellitus with diabetic autonomic (poly)neuropathy: Secondary | ICD-10-CM | POA: Diagnosis present

## 2022-10-16 DIAGNOSIS — D649 Anemia, unspecified: Secondary | ICD-10-CM | POA: Diagnosis present

## 2022-10-16 DIAGNOSIS — R1084 Generalized abdominal pain: Secondary | ICD-10-CM | POA: Diagnosis not present

## 2022-10-16 DIAGNOSIS — K3184 Gastroparesis: Secondary | ICD-10-CM | POA: Diagnosis not present

## 2022-10-16 DIAGNOSIS — R9431 Abnormal electrocardiogram [ECG] [EKG]: Secondary | ICD-10-CM | POA: Diagnosis present

## 2022-10-16 DIAGNOSIS — F324 Major depressive disorder, single episode, in partial remission: Secondary | ICD-10-CM | POA: Diagnosis present

## 2022-10-16 DIAGNOSIS — M329 Systemic lupus erythematosus, unspecified: Secondary | ICD-10-CM | POA: Diagnosis present

## 2022-10-16 DIAGNOSIS — E876 Hypokalemia: Secondary | ICD-10-CM | POA: Diagnosis not present

## 2022-10-16 DIAGNOSIS — E7800 Pure hypercholesterolemia, unspecified: Secondary | ICD-10-CM | POA: Diagnosis present

## 2022-10-16 LAB — COMPREHENSIVE METABOLIC PANEL
ALT: 10 U/L (ref 0–44)
AST: 14 U/L — ABNORMAL LOW (ref 15–41)
Albumin: 3.1 g/dL — ABNORMAL LOW (ref 3.5–5.0)
Alkaline Phosphatase: 89 U/L (ref 38–126)
Anion gap: 12 (ref 5–15)
BUN: 13 mg/dL (ref 6–20)
CO2: 18 mmol/L — ABNORMAL LOW (ref 22–32)
Calcium: 8.9 mg/dL (ref 8.9–10.3)
Chloride: 108 mmol/L (ref 98–111)
Creatinine, Ser: 0.79 mg/dL (ref 0.44–1.00)
GFR, Estimated: >60 mL/min (ref >60)
Glucose, Bld: 169 mg/dL — ABNORMAL HIGH (ref 70–99)
Potassium: 4 mmol/L (ref 3.5–5.1)
Sodium: 138 mmol/L (ref 135–145)
Total Bilirubin: 0.9 mg/dL (ref 0.3–1.2)
Total Protein: 6.4 g/dL — ABNORMAL LOW (ref 6.5–8.1)

## 2022-10-16 LAB — CBC
HCT: 33.7 % — ABNORMAL LOW (ref 36.0–46.0)
HCT: 31.9 % — ABNORMAL LOW (ref 36.0–46.0)
Hemoglobin: 10.8 g/dL — ABNORMAL LOW (ref 12.0–15.0)
Hemoglobin: 10.7 g/dL — ABNORMAL LOW (ref 12.0–15.0)
MCH: 28.9 pg (ref 26.0–34.0)
MCH: 29.7 pg (ref 26.0–34.0)
MCHC: 32 g/dL (ref 30.0–36.0)
MCHC: 33.5 g/dL (ref 30.0–36.0)
MCV: 90.1 fL (ref 80.0–100.0)
MCV: 88.6 fL (ref 80.0–100.0)
Platelets: 237 10*3/uL (ref 150–400)
Platelets: 245 10*3/uL (ref 150–400)
RBC: 3.74 MIL/uL — ABNORMAL LOW (ref 3.87–5.11)
RBC: 3.6 MIL/uL — ABNORMAL LOW (ref 3.87–5.11)
RDW: 14.1 % (ref 11.5–15.5)
RDW: 14 % (ref 11.5–15.5)
WBC: 19.6 10*3/uL — ABNORMAL HIGH (ref 4.0–10.5)
WBC: 18.1 10*3/uL — ABNORMAL HIGH (ref 4.0–10.5)
nRBC: 0 % (ref 0.0–0.2)
nRBC: 0 % (ref 0.0–0.2)

## 2022-10-16 LAB — GLUCOSE, CAPILLARY
Glucose-Capillary: 188 mg/dL — ABNORMAL HIGH (ref 70–99)
Glucose-Capillary: 176 mg/dL — ABNORMAL HIGH (ref 70–99)
Glucose-Capillary: 169 mg/dL — ABNORMAL HIGH (ref 70–99)
Glucose-Capillary: 176 mg/dL — ABNORMAL HIGH (ref 70–99)

## 2022-10-16 LAB — PROCALCITONIN: Procalcitonin: 0.18 ng/mL

## 2022-10-16 MED ORDER — ONDANSETRON HCL 4 MG/2ML IJ SOLN
4.0000 mg | Freq: Four times a day (QID) | INTRAMUSCULAR | Status: DC
  Administered 2022-10-16 – 2022-10-20 (×14): 4 mg via INTRAVENOUS
  Filled 2022-10-16 (×15): qty 2

## 2022-10-16 MED ORDER — PANTOPRAZOLE SODIUM 40 MG IV SOLR
40.0000 mg | Freq: Two times a day (BID) | INTRAVENOUS | Status: DC
  Administered 2022-10-16 – 2022-10-19 (×7): 40 mg via INTRAVENOUS
  Filled 2022-10-16 (×7): qty 10

## 2022-10-16 MED ORDER — HYDROMORPHONE HCL 1 MG/ML IJ SOLN
0.5000 mg | INTRAMUSCULAR | Status: DC | PRN
  Administered 2022-10-16 – 2022-10-20 (×11): 0.5 mg via INTRAVENOUS
  Filled 2022-10-16 (×12): qty 0.5

## 2022-10-16 MED ORDER — ONDANSETRON HCL 4 MG/2ML IJ SOLN
4.0000 mg | Freq: Four times a day (QID) | INTRAMUSCULAR | Status: DC | PRN
  Administered 2022-10-16: 4 mg via INTRAVENOUS
  Filled 2022-10-16: qty 2

## 2022-10-16 NOTE — Plan of Care (Signed)
  Problem: Health Behavior/Discharge Planning: Goal: Ability to manage health-related needs will improve Outcome: Progressing   Problem: Nutrition: Goal: Adequate nutrition will be maintained Outcome: Progressing   Problem: Pain Managment: Goal: General experience of comfort will improve Outcome: Progressing   

## 2022-10-16 NOTE — Plan of Care (Signed)
  Problem: Clinical Measurements: Goal: Cardiovascular complication will be avoided Outcome: Progressing   Problem: Activity: Goal: Risk for activity intolerance will decrease Outcome: Progressing   Problem: Coping: Goal: Level of anxiety will decrease Outcome: Progressing   Problem: Pain Managment: Goal: General experience of comfort will improve Outcome: Progressing   

## 2022-10-16 NOTE — Hospital Course (Addendum)
Per HPI: 52 y.o.f w/ diabetes with gastroparesis, history of PE, history of CVA, OHS, obesity, hypertension, hyperlipidemia, Bell's palsy, lupus, Sjogren's, anemia, depression, chronic systolic CHF presented with recurrent nausea and vomiting and abdominal pain on 10/15/22. She has had ongoing nausea and vomiting for the past several days.She has had 3 ED visits in the last 3 days.  3/15 she was seen and received a dose of droperidol with improving symptoms and was discharged home.  She was doing okay initially and then her symptoms returned overnight aso reports some ongoing chest pain that worsened with  tenderness to palpation of chest wall. She has been unable to keep down her medications for the past couple days.    ED Course: Vital signs in the ED significant for blood pressure in the [id number] systolic, respiratory rate in the teens to 20s.  Lab workup included CMP with bicarb 18, creatinine stable 1.05, glucose 226, calcium 8.7, protein 6.3, albumin 3.1.  CBC with mild leukocytosis to 14.4, hemoglobin stable at 10.6.  Lipase negative, troponin negative x 2.  Urinalysis with glucose, ketones, leukocytes, bacteria.  Urine culture ordered Chest x-ray and CT abdomen pelvis 3/15- without acute abnormality.  Chest x-ray trepeat showed cardiomegaly with mild interstitial opacities possibly representing edema. EKG-showed QTc prolonged at 571.  Patient received Dilaudid, droperidol, Zofran, liter of IV fluids in the ED Patient was seen by GI EGD attempted but patient refused/consent issues and subsequently conservatively manage diet was slowly advanced at this time she is tolerating diet well no nausea or vomiting.  Likely multifactorial with polypharmacy. She will be discharged home today after tolerating diet.  She will Need to follow-up with her GI team at [facility] and her cardiology team.

## 2022-10-16 NOTE — Consult Note (Signed)
Referring Provider: No ref. provider found Primary Care Physician:  Bartholome Bill, MD Primary Gastroenterologist:  Althia Forts  Reason for Consultation: Intractable nausea/vomiting  HPI: [name] is a 52 y.o. female with diabetes, history of PE on Eliquis, history of CVA, OHS, obesity, hypertension, hyperlipidemia, Bell's palsy, lupus, Sjogren's, chronic anemia, depression, chronic systolic CHF presented with recurrent nausea and vomiting and abdominal pain on 10/15/22.  She is very sleepy at the time of my visit, drifting in and out of conversation.  She reports pain in her mid-lower abdomen.  Says that last BM was Wednesday, says that she has not been eating much the past couple of days..  No sign of black or bloody stools.    CT scan abdomen/pelvis with contrast shows a small hiatal hernia, no acute intra-abdominal process.  GES in 2021 normal.  HIDA scan in 01/2021 normal.  Does have an elevated white blood cell count to 18.1 K today.  Not on any abx.  Has chronic anemia.  BUN normal and no sign of GI bleeding.    EGD 01/2021 by Dr. Michail Sermon for complaints of nausea/vomiting with acute gastritis.  No biopsies obtained.  EGD by Dr. Ardis Hughs in 2016 for complaints of nausea vomiting that was normal.  EGD in 2014 by Dr. Henrene Pastor for complaints of nausea and vomiting with esophagitis seen.   Past Medical History:  Diagnosis Date   Acute renal failure (HCC)     Intractable nausea vomiting secondary to diabetic gastroparesis causing dehydration and acute renal failure /notes 04/01/2013   Anginal pain ([place])    Anxiety    Bell's palsy 08/09/2010   Bicornuate uterus    CAP (community acquired pneumonia) 10/2011   Archie Endo 11/08/2011   Chest pain 01/13/2014   CHF (congestive heart failure) ([place])    Diabetic gastroparesis associated with type 2 diabetes mellitus ([place])    this is presumed diagnoses, not confirmed by any studies.    Eczema    Family history of breast cancer    Family  history of malignant neoplasm of breast    Family history of malignant neoplasm of ovary    Family history of ovarian cancer    Family history of prostate cancer    GERD (gastroesophageal reflux disease)    High cholesterol    Hypertension    Liver lesion 08/13/2019   Liver mass 2014   biopsied 03/2013 at [facility], not malignant.  is to undergo radiologic ablation of the mass in sept/October 2014.    Lupus ([place])    Migraines    "maybe a couple times/yr" (04/01/2013)   Obesity    Obstructive sleep apnea on CPAP 2011   Oxygen at nights   Pulmonary embolism ([place]) 05/21/2021   SJOGREN'S SYNDROME 08/09/2010   SLE (systemic lupus erythematosus) ([place])    Archie Endo 12/01/2010    Past Surgical History:  Procedure Laterality Date   BREAST CYST EXCISION Left 08/2005   epidermoid   CARDIAC CATHETERIZATION  02/07/2012   ECTOPIC PREGNANCY SURGERY  1999   ESOPHAGOGASTRODUODENOSCOPY N/A 02/26/2013   Procedure: ESOPHAGOGASTRODUODENOSCOPY (EGD);  Surgeon: Irene Shipper, MD;  Location: [facility]COPY;  Service: Endoscopy;  Laterality: N/A;   ESOPHAGOGASTRODUODENOSCOPY N/A 03/02/2015   Procedure: ESOPHAGOGASTRODUODENOSCOPY (EGD);  Surgeon: Milus Banister, MD;  Location: [place];  Service: Endoscopy;  Laterality: N/A;   ESOPHAGOGASTRODUODENOSCOPY N/A 02/23/2021   Procedure: ESOPHAGOGASTRODUODENOSCOPY (EGD);  Surgeon: Wilford Corner, MD;  Location: [place];  Service: Endoscopy;  Laterality: N/A;  possible dilation  HERNIA REPAIR     LEFT AND RIGHT HEART CATHETERIZATION WITH CORONARY ANGIOGRAM N/A 02/07/2012   Procedure: LEFT AND RIGHT HEART CATHETERIZATION WITH CORONARY ANGIOGRAM;  Surgeon: Laverda Page, MD;  Location: [facility] CATH LAB;  Service: Cardiovascular;  Laterality: N/A;   LIVER BIOPSY  03/2013   liver mass/medical hx noted above   MUSCLE BIOPSY     for lupus/notes [id number]   UMBILICAL HERNIA REPAIR  1980's    Prior to Admission medications   Medication Sig Start Date End  Date Taking? Authorizing Provider  ADVAIR HFA 230-21 MCG/ACT inhaler Inhale 2 puffs into the lungs 2 (two) times daily. 12/20/20  Yes [provider]  albuterol (PROVENTIL) (2.5 MG/3ML) 0.083% nebulizer solution Take 2.5 mg by nebulization as needed for wheezing.   Yes [provider]  ALPRAZolam Duanne Moron) 0.5 MG tablet Take 0.5 mg by mouth as needed for anxiety or sleep. 04/19/17  Yes [provider]  ARIPiprazole (ABILIFY) 5 MG tablet  01/14/21  Yes [provider]  amLODipine (NORVASC) 10 MG tablet Take 10 mg by mouth daily.    [provider]  apixaban (ELIQUIS) 2.5 MG TABS tablet Take 1 tablet (2.5 mg total) by mouth 2 (two) times daily. 09/21/22   Adrian Prows, MD  Azathioprine 75 MG TABS Take 150 mg by mouth 2 (two) times daily. 02/04/21   [provider]  B-D UF III MINI PEN NEEDLES 31G X 5 MM MISC USE AS DIRECTED TWICE A DAY 10/31/18   Renato Shin, MD  Belimumab (BENLYSTA) 200 MG/ML SOAJ Inject 200 mg into the skin once a week. Thursdays 12/19/19   [provider]  buPROPion (WELLBUTRIN XL) 300 MG 24 hr tablet Take 300 mg by mouth daily. 03/14/22   [provider]  carvedilol (COREG) 25 MG tablet Take 1.5 tablets (37.5 mg total) by mouth 2 (two) times daily with a meal. 01/01/21   Adrian Prows, MD  Continuous Blood Gluc Sensor (FREESTYLE LIBRE 3 SENSOR) MISC 1 Device by Does not apply route every 14 (fourteen) days. Apply 1 sensor on upper arm every 14 days for continuous glucose monitoring 05/25/22   Elayne Snare, MD  cromolyn (OPTICROM) 4 % ophthalmic solution Place 1 drop into both eyes 2 (two) times daily. 01/14/21   [provider]  cycloSPORINE (RESTASIS) 0.05 % ophthalmic emulsion Place 1 drop into both eyes in the morning, at noon, and at bedtime.     [provider]  diclofenac Sodium (VOLTAREN) 1 % GEL APPLY 2 GRAMS TO AFFECTED AREA 4 TIMES A DAY Patient taking differently: Apply 2 g topically 4 (four) times  daily as needed (pain). 01/11/22   Cantwell, Celeste C, PA-C  EASY TOUCH PEN NEEDLES 32G X 4 MM MISC USE TO INJECT INSULIN TWICE DAILY 08/07/19   Renato Shin, MD  ENTRESTO 97-103 MG TAKE 1 TABLET BY MOUTH TWICE A DAY 04/18/22   Adrian Prows, MD  famotidine (PEPCID) 20 MG tablet Take 20 mg by mouth 2 (two) times daily.    [provider]  fluticasone (FLONASE) 50 MCG/ACT nasal spray Place 2 sprays into both nostrils continuous as needed for allergies or rhinitis.    [provider]  glucose blood (ACCU-CHEK GUIDE) test strip 1 each by Other route 2 (two) times daily. And lancets 2/day 06/18/21   Renato Shin, MD  glucose blood [facility] ULTRA) test strip Test blood sugar 4 times a day 03/30/22   Elayne Snare, MD  hydroxychloroquine (PLAQUENIL) 200 MG tablet  Take 200 mg by mouth 2 (two) times daily.    [provider]  ibuprofen (ADVIL) 600 MG tablet Take 1 tablet (600 mg total) by mouth every 6 (six) hours as needed. 10/14/22   Montine Circle, PA-C  insulin regular human CONCENTRATED (HUMULIN R U-500 KWIKPEN) 500 UNIT/ML KwikPen 100 UNITS, 30 MINUTES BEFORE [facility] MEAL 08/16/22   Elayne Snare, MD  Insulin Syringe-Needle U-100 (EASY TOUCH INSULIN SYRINGE) 31G X 5/16" 1 ML MISC USE AS DIRECTED THREE TIMES A DAY 10/12/18   Renato Shin, MD  isosorbide-hydrALAZINE (BIDIL) 20-37.5 MG tablet TAKE TWO (2) TABLETS BY MOUTH THREE TIMES DAILY Patient taking differently: Take 2 tablets by mouth 3 (three) times daily. 10/21/21   Cantwell, Celeste C, PA-C  JARDIANCE 10 MG TABS tablet TAKE 1 TABLET (10 MG TOTAL) BY MOUTH DAILY WITH BREAKFAST. 08/18/22   Elayne Snare, MD  Lancets [facility] ULTRASOFT) lancets Test blood sugar 4 times a day 03/30/22   Elayne Snare, MD  levothyroxine (SYNTHROID) 50 MCG tablet Take 1 tablet (50 mcg total) by mouth daily at 6 (six) AM. 08/30/19   Raiford Noble Latif, DO  megestrol (MEGACE) 40 MG tablet Take 40 mg by mouth daily. 01/30/15   [provider]  metFORMIN  (GLUCOPHAGE-XR) 500 MG 24 hr tablet Take 2 tablets (1,000 mg total) by mouth 2 (two) times daily. 02/02/21   Renato Shin, MD  Multiple Vitamin (MULTIVITAMIN WITH MINERALS) TABS tablet Take 1 tablet by mouth daily. 08/30/19   Raiford Noble Latif, DO  ondansetron (ZOFRAN-ODT) 4 MG disintegrating tablet Take 1 tablet (4 mg total) by mouth every 8 (eight) hours as needed for nausea or vomiting. 10/14/22   Montine Circle, PA-C  oxybutynin (DITROPAN-XL) 5 MG 24 hr tablet Take 5 mg by mouth daily. 04/25/22   [provider]  pregabalin (LYRICA) 75 MG capsule Take 75 mg 3 (three) times daily by mouth.  01/28/15   [provider]  prochlorperazine (COMPAZINE) 10 MG tablet Take 1 tablet (10 mg total) by mouth every 6 (six) hours as needed for nausea or vomiting. 05/14/22   Bonnielee Haff, MD  promethazine (PHENERGAN) 25 MG tablet Take 1 tablet (25 mg total) by mouth every 8 (eight) hours as needed for nausea or vomiting. 10/14/22   Montine Circle, PA-C  QUEtiapine (SEROQUEL) 300 MG tablet Take 300 mg by mouth at bedtime. 01/08/21   [provider]  rosuvastatin (CRESTOR) 20 MG tablet Take 1 tablet (20 mg total) by mouth daily. 06/09/21 09/21/22  Cantwell, Celeste C, PA-C  Semaglutide, 2 MG/DOSE, 8 MG/3ML SOPN Inject 2 mg as directed once a week. 08/18/22   Elayne Snare, MD  spironolactone (ALDACTONE) 50 MG tablet TAKE 1 TABLET BY MOUTH EVERY DAY Patient taking differently: Take 50 mg by mouth daily. 02/04/22   Adrian Prows, MD  sucralfate (CARAFATE) 1 GM/10ML suspension Take 10 mLs by mouth 2 (two) times daily as needed (abdomen pain). 12/09/20   [provider]  topiramate (TOPAMAX) 25 MG tablet Take 75 mg by mouth at bedtime. 03/17/16   [provider]  torsemide (DEMADEX) 20 MG tablet Take 1 tablet (20 mg total) by mouth at bedtime. 06/09/21   Cantwell, Celeste C, PA-C  TRANSDERM-SCOP 1 MG/3DAYS Place 1 patch onto the skin every 3 (three) days. As needed for nausea 12/22/21    [provider]  VICTOZA 18 MG/3ML SOPN Inject 0.6 mg into the skin. 06/11/22   [provider]  Vitamin D, Ergocalciferol, (DRISDOL) 1.25 MG (50000  UNIT) CAPS capsule Take 50,000 Units by mouth once a week. 09/20/22   [provider]    Current Facility-Administered Medications  Medication Dose Route Frequency Provider Last Rate Last Admin   acetaminophen (TYLENOL) tablet 650 mg  650 mg Oral Q6H PRN Marcelyn Bruins, MD       Or   acetaminophen (TYLENOL) suppository 650 mg  650 mg Rectal Q6H PRN Marcelyn Bruins, MD       albuterol (PROVENTIL) (2.5 MG/3ML) 0.083% nebulizer solution 2.5 mg  2.5 mg Nebulization Q6H PRN Marcelyn Bruins, MD       apixaban Arne Cleveland) tablet 2.5 mg  2.5 mg Oral BID Marcelyn Bruins, MD   2.5 mg at 10/16/22 1008   buPROPion (WELLBUTRIN XL) 24 hr tablet 300 mg  300 mg Oral Daily Marcelyn Bruins, MD   300 mg at 10/16/22 1008   carvedilol (COREG) tablet 37.5 mg  37.5 mg Oral BID WC Marcelyn Bruins, MD   37.5 mg at 10/16/22 1008   cycloSPORINE (RESTASIS) 0.05 % ophthalmic emulsion 1 drop  1 drop Both Eyes TID Marcelyn Bruins, MD   1 drop at 10/16/22 [id number]   hydroxychloroquine (PLAQUENIL) tablet 200 mg  200 mg Oral BID Marcelyn Bruins, MD   200 mg at 10/16/22 1008   insulin aspart (novoLOG) injection 0-15 Units  0-15 Units Subcutaneous TID WC Marcelyn Bruins, MD   2 Units at 10/16/22 [id number]   insulin aspart (novoLOG) injection 0-5 Units  0-5 Units Subcutaneous QHS Marcelyn Bruins, MD       levothyroxine (SYNTHROID) tablet 50 mcg  50 mcg Oral Q0600 Marcelyn Bruins, MD   50 mcg at 10/16/22 0645   LORazepam (ATIVAN) tablet 0.5-1 mg  0.5-1 mg Oral Q6H PRN Marcelyn Bruins, MD   1 mg at 10/15/22 1952   mometasone-formoterol (DULERA) 200-5 MCG/ACT inhaler 2 puff  2 puff Inhalation BID Marcelyn Bruins, MD   2 puff at 10/16/22 0823   ondansetron (ZOFRAN) injection 4 mg  4 mg Intravenous Q6H PRN Antonieta Pert, MD    4 mg at 10/16/22 0844   polyethylene glycol (MIRALAX / GLYCOLAX) packet 17 g  17 g Oral Daily PRN Marcelyn Bruins, MD       pregabalin (LYRICA) capsule 75 mg  75 mg Oral TID Marcelyn Bruins, MD   75 mg at 10/16/22 1008   QUEtiapine (SEROQUEL) tablet 300 mg  300 mg Oral QHS Marcelyn Bruins, MD       rosuvastatin (CRESTOR) tablet 20 mg  20 mg Oral Daily Marcelyn Bruins, MD   20 mg at 10/16/22 1008   sacubitril-valsartan (ENTRESTO) 97-103 mg per tablet  1 tablet Oral BID Marcelyn Bruins, MD   1 tablet at 10/16/22 1009   sodium chloride flush (NS) 0.9 % injection 3 mL  3 mL Intravenous Q12H Marcelyn Bruins, MD   3 mL at 10/16/22 [id number]   sucralfate (CARAFATE) 1 GM/10ML suspension 1 g  1 g Oral BID PRN Marcelyn Bruins, MD       topiramate (TOPAMAX) tablet 75 mg  75 mg Oral QHS Marcelyn Bruins, MD   75 mg at 10/16/22 0002   torsemide (DEMADEX) tablet 20 mg  20 mg Oral QHS Marcelyn Bruins, MD        Allergies as of 10/15/2022 - Reviewed 10/15/2022  Allergen Reaction Noted   Metoclopramide Other (See Comments) 04/25/2013   Codeine Itching,  Rash, and Other (See Comments) 05/09/2013   Hydrocodone Rash 08/07/2016   Penicillins Itching    Percocet [oxycodone-acetaminophen] Hives and Rash 10/06/2016    Family History  Problem Relation Age of Onset   Diabetes Mother    Asthma Mother    Urticaria Mother    Hypertension Mother    Stroke Mother    Ovarian cancer Sister 50       maternal half-sister; RAD51C positive   Asthma Brother    Asthma Brother    Breast cancer Maternal Aunt 60       currently 73   Cancer Maternal Aunt        unk. primary   Cancer Maternal Aunt        unk. primary   Ovarian cancer Maternal Aunt        deceased 63s   Breast cancer Maternal Aunt        deceased 63s   Breast cancer Maternal Aunt    Stomach cancer Maternal Uncle        dx 80s; deceased   Prostate cancer Maternal Uncle        deceased 29s   Breast cancer Cousin         deceased 40s; daughter of mat uncle with stomach ca   Hypertension Other    Stroke Other    Arthritis Other    Allergic rhinitis Neg Hx    Angioedema Neg Hx     Social History   Socioeconomic History   Marital status: Single    Spouse name: Not on file   Number of children: 1   Years of education: Not on file   Highest education level: Not on file  Occupational History    Employer: UNEMPLOYED    Comment: student  Tobacco Use   Smoking status: Former    Packs/day: 0.50    Years: 3.00    Additional pack years: 0.00    Total pack years: 1.50    Types: Cigarettes    Quit date: 06/02/1991    Years since quitting: 31.3   Smokeless tobacco: Never   Tobacco comments:    quit 28 yrs ago  Vaping Use   Vaping Use: Never used  Substance and Sexual Activity   Alcohol use: No    Alcohol/week: 0.0 standard drinks of alcohol   Drug use: No   Sexual activity: Yes    Partners: Male    Birth control/protection: None  Other Topics Concern   Not on file  Social History Narrative   Regular exercise-yes   Social Determinants of Health   Financial Resource Strain: Not on file  Food Insecurity: No Food Insecurity (10/15/2022)   Hunger Vital Sign    Worried About Running Out of Food in the Last Year: Never true    Ran Out of Food in the Last Year: Never true  Transportation Needs: No Transportation Needs (10/15/2022)   PRAPARE - Hydrologist (Medical): No    Lack of Transportation (Non-Medical): No  Physical Activity: Not on file  Stress: Not on file  Social Connections: Not on file  Intimate Partner Violence: Not At Risk (10/15/2022)   Humiliation, Afraid, Rape, and Kick questionnaire    Fear of Current or Ex-Partner: No    Emotionally Abused: No    Physically Abused: No    Sexually Abused: No    Review of Systems: ROS is O/W negative except as mentioned in HPI.  Physical Exam: Vital signs in last  24 hours: Temp:  [99 F (37.2 C)-99.6 F (37.6  C)] 99 F (37.2 C) (03/17 0723) Pulse Rate:  [92-100] 93 (03/17 0723) Resp:  [9-17] 17 (03/17 0439) BP: (113-153)/(63-102) 129/70 (03/17 0723) SpO2:  [98 %-100 %] 99 % (03/17 0723)   General:  Alert, morbidly obese, pleasant and cooperative in NAD Head:  Normocephalic and atraumatic. Eyes:  Sclera clear, no icterus.  Conjunctiva pink. Ears:  Normal auditory acuity. Mouth:  No deformity or lesions.   Lungs:  Clear throughout to auscultation.  No wheezes, crackles, or rhonchi.  Heart:  Regular rate and rhythm; no murmurs, clicks, rubs, or gallops. Abdomen:  Soft, non-distended.  BS present.  Some lower to mid-abdominal TTP.    Msk:  Symmetrical without gross deformities. Pulses:  Normal pulses noted. Extremities:  Without clubbing or edema. Neurologic:  Alert and oriented x 4;  grossly normal neurologically. Skin:  Intact without significant lesions or rashes. Psych:  Alert and cooperative. Normal mood and affect.  Lab Results: Recent Labs    10/15/22 1118 10/16/22 0314 10/16/22 1000  WBC 14.4* 19.6* 18.1*  HGB 10.6* 10.8* 10.7*  HCT 33.4* 33.7* 31.9*  PLT 237 237 245   BMET Recent Labs    10/14/22 0004 10/15/22 1118 10/16/22 0314  NA 140 137 138  K 3.9 3.8 4.0  CL 108 107 108  CO2 19* 18* 18*  GLUCOSE 212* 226* 169*  BUN 15 16 13   CREATININE 1.05* 0.93 0.79  CALCIUM 9.7 8.7* 8.9   LFT Recent Labs    10/16/22 0314  PROT 6.4*  ALBUMIN 3.1*  AST 14*  ALT 10  ALKPHOS 89  BILITOT 0.9   Studies/Results: DG Chest Portable 1 View  Result Date: 10/15/2022 CLINICAL DATA:  Chest pain EXAM: PORTABLE CHEST 1 VIEW COMPARISON:  Radiograph 10/14/2022 FINDINGS: Unchanged cardiomegaly. There are mild lower lung predominant interstitial opacities. No large effusion or evidence of pneumothorax. Bones are unchanged. IMPRESSION: Unchanged cardiomegaly. Mild lower lung predominant interstitial opacities, could reflect mild interstitial edema. Electronically Signed   By: Maurine Simmering M.D.   On: 10/15/2022 11:07    IMPRESSION:  *Recurrent nausea and vomiting: This been an issue dating back to 2014.  She has had 3 EGDs since 2014 for the same issue.  Says history of gastroparesis, but she had a gastric emptying scan in May 2021 that was normal.  Apparently had an adverse reaction to metoclopramide in the past.  CT scan unremarkable. *? Prolonged QTc: They are going to repeat EKG to recalculate this, but will hold on on QTc prolonging antiemetics for now.  Currently on Ativan *History of PE on Eliquis *Chronic anemia: Hemoglobin stable.  No sign of GI bleeding.  BUN normal. *Leukocytosis at 18.1 K today.  ?  Source.  Not on any abx.  PLAN: -Repeat EKG shows QTc ok.  Will schedule zofran for now 4 mg IV every 6 hours ATC. -Continue ativan prn per hospitalist. -Says that scopolamine patch has helped in the past.  Consider this as well. -Will place her on pantoprazole 40 mg IV twice daily for now as well. -Will give a glycerin suppository to try to get her bowels moving.  Will hold on miralax/PO regimen due to N/V.   Laban Emperor. Nickholas Goldston  10/16/2022, 12:17 PM

## 2022-10-16 NOTE — Progress Notes (Signed)
PROGRESS NOTE [name]  [id number] DOB: [birth year] DOA: 10/15/2022 PCP: Bartholome Bill, MD  Brief Narrative/Hospital Course: Per HPI: 52 y.o.f w/ diabetes with gastroparesis, history of PE, history of CVA, OHS, obesity, hypertension, hyperlipidemia, Bell's palsy, lupus, Sjogren's, anemia, depression, chronic systolic CHF presented with recurrent nausea and vomiting and abdominal pain on 10/15/22. She has had ongoing nausea and vomiting for the past several days.She has had 3 ED visits in the last 3 days.  3/15 she was seen and received a dose of droperidol with improving symptoms and was discharged home.  She was doing okay initially and then her symptoms returned overnight aso reports some ongoing chest pain that worsened with  tenderness to palpation of chest wall. She has been unable to keep down her medications for the past couple days.    ED Course: Vital signs in the ED significant for blood pressure in the [id number] to [id number] systolic, respiratory rate in the teens to 20s.  Lab workup included CMP with bicarb 18, creatinine stable 1.05, glucose 226, calcium 8.7, protein 6.3, albumin 3.1.  CBC with mild leukocytosis to 14.4, hemoglobin stable at 10.6.  Lipase negative, troponin negative x 2.  Urinalysis with glucose, ketones, leukocytes, bacteria.  Urine culture ordered Chest x-ray and CT abdomen pelvis 3/15- without acute abnormality.  Chest x-ray trepeat showed cardiomegaly with mild interstitial opacities possibly representing edema. EKG-showed QTc prolonged at 571.  Patient received Dilaudid, droperidol, Zofran, liter of IV fluids in the ED    Subjective: Seen and examined, Overnight vitals/labs/events reviewed  Overnight patient has been afebrile, BP stable, not hypoxic Labs reviewed leukocytosis 19.6 from 14.4, trops.5, 9, CMP with bicarb 18 normal LFTs Repeat EKG 3/17 shows [id number] Per nursing staff unable to take oral ativan or her scheduled oral meds> IV Zofran given and feels  better now- no more vomiting Chest was hurting from throwing up now better. No BM since her last on q WEDNESDAY   Assessment and Plan: Principal Problem:   Intractable nausea and vomiting Active Problems:   SJOGREN'S SYNDROME   Chronic diastolic CHF (congestive heart failure) (HCC)   Morbid obesity (HCC)   Diabetic gastroparesis (HCC)   HLD (hyperlipidemia)   Type 2 diabetes mellitus with hyperlipidemia (HCC)   Obesity hypoventilation syndrome (HCC)   Essential hypertension   History of pulmonary embolism   Normocytic anemia   History of CVA (cerebrovascular accident)   Major depressive disorder with single episode, in partial remission ([place])   SLE (systemic lupus erythematosus related syndrome) (HCC)   Intractable nausea vomiting recurrent problem dating back to 2014, has had 3 EGDs since 2014 History of gastroparesis: Gastric emptying study in May 2020 was normal per GI. Patient not tolerating Reglan in the past CT abdomen unremarkable, QTc stable on EKG added Zofran, continue Ativan, dexamethasone symptomatic management, GI consulted. Received droperidol In ED. overall feels much better today.  Diabetes mellitus blood sugar well-controlled, continue SSI Recent Labs  Lab 10/16/22 0726 10/16/22 1125  GLUCAP 188* 176*    History of PE continue home Eliquis   Chronic combined systolic and diastolic CHF: Follows with Dr. Einar Gip, recent EF 2022 showed EF of 30-35% with G1 DD and severely reduced RV function.outpatient cardiology note menionted improved EF in 2023 but cannont locate TTE.  Denies shortness of breath, chest x-ray with cardiomegaly and mild changes that could be consistent with edema.  Continue home torsemide Entresto Coreg, holding Aldactone and BiDil for now due to poor intake risk of dehydration.  Monitor fluid balance Daily weight as below Net IO Since Admission: No IO data has been entered for this period [10/16/22 1303]  Filed Weights   10/15/22 1013  Weight: (!)  149.7 kg    Hypertension: BP stable continue her home Coreg,  Hyperlipidemia: Continue rosuvastatin History of stroke:Continue home rosuvastatin, Eliquis  Lupus Sjogren's: No issues currently continue home hydroxychloroquine, Lyrica and Is on belimumab injections outpatient   Anemia appears chronic:Stable, monitor Recent Labs  Lab 10/14/22 0004 10/15/22 1118 10/16/22 0314 10/16/22 1000  HGB 12.0 10.6* 10.8* 10.7*  HCT 37.5 33.4* 33.7* 31.9*    Anxiety/depression continue Seroquel ibuprofen, holding PRN Xanax while getting Ativan   Morbid Obesity /W [id number] Body mass index is 50.18 kg/m. : Will benefit with PCP follow-up, weight loss  healthy lifestyle and outpatient sleep evaluation.   DVT prophylaxis: apixaban (ELIQUIS) tablet 2.5 mg Start: 10/15/22 2200 Code Status:   Code Status: Full Code Family Communication: plan of care discussed with patient at bedside. Patient status is: Inpatient because of intractable nausea vomiting Level of care: Telemetry Medical   Dispo: The patient is from: home            Anticipated disposition: TBD Objective: Vitals last 24 hrs: Vitals:   10/15/22 1937 10/16/22 0439 10/16/22 0723 10/16/22 0723  BP: (!) 113/102 134/68 129/70 129/70  Pulse: 92 96 94 93  Resp: 16 17    Temp: 99.4 F (37.4 C) 99.6 F (37.6 C) 99 F (37.2 C) 99 F (37.2 C)  TempSrc: Oral Oral Oral Oral  SpO2: 98% 99% 99% 99%  Weight:      Height:       Weight change:   Physical Examination: General exam: alert awake oriented x 3, older than stated age HEENT:Oral mucosa moist, Ear/Nose WNL grossly Respiratory system: bilaterally clear BS, no use of accessory muscle Cardiovascular system: S1 & S2 +, No JVD. Gastrointestinal system: Abdomen soft, mild diffuse tenderness obese BS sluggish Nervous System:Alert, awake, moving extremities. Extremities: LE edema neg,distal peripheral pulses palpable.  Skin: No rashes,no icterus. MSK: Normal muscle bulk,tone,  power  Medications reviewed:  Scheduled Meds:  apixaban  2.5 mg Oral BID   buPROPion  300 mg Oral Daily   carvedilol  37.5 mg Oral BID WC   cycloSPORINE  1 drop Both Eyes TID   hydroxychloroquine  200 mg Oral BID   insulin aspart  0-15 Units Subcutaneous TID WC   insulin aspart  0-5 Units Subcutaneous QHS   levothyroxine  50 mcg Oral Q0600   mometasone-formoterol  2 puff Inhalation BID   pregabalin  75 mg Oral TID   QUEtiapine  300 mg Oral QHS   rosuvastatin  20 mg Oral Daily   sacubitril-valsartan  1 tablet Oral BID   sodium chloride flush  3 mL Intravenous Q12H   topiramate  75 mg Oral QHS   torsemide  20 mg Oral QHS   Continuous Infusions:    Diet Order             Diet full liquid Room service appropriate? Yes; Fluid consistency: Thin  Diet effective now                 No intake or output data in the 24 hours ending 10/16/22 0945 Net IO Since Admission: No IO data has been entered for this period [10/16/22 0945]  Wt Readings from Last 3 Encounters:  10/15/22 (!) 149.7 kg  10/14/22 (!) 149.7 kg  10/13/22 (!) 149.7 kg  Unresulted Labs (From admission, onward)     Start     Ordered   10/16/22 0832  Procalcitonin  Add-on,   AD       References:    Procalcitonin Lower Respiratory Tract Infection AND Sepsis Procalcitonin Algorithm   10/16/22 0831   10/15/22 1515  Urine Culture  Once,   URGENT       Question:  Indication  Answer:  Urgency/frequency   10/15/22 1514          Data Reviewed: I have personally reviewed following labs and imaging studies CBC: Recent Labs  Lab 10/14/22 0004 10/15/22 1118 10/16/22 0314  WBC 15.9* 14.4* 19.6*  NEUTROABS  --  11.5*  --   HGB 12.0 10.6* 10.8*  HCT 37.5 33.4* 33.7*  MCV 90.1 92.3 90.1  PLT 294 237 [id number]   Basic Metabolic Panel: Recent Labs  Lab 10/14/22 0004 10/15/22 1118 10/16/22 0314  NA 140 137 138  K 3.9 3.8 4.0  CL 108 107 108  CO2 19* 18* 18*  GLUCOSE 212* 226* 169*  BUN 15 16 13   CREATININE  1.05* 0.93 0.79  CALCIUM 9.7 8.7* 8.9   GFR: Estimated Creatinine Clearance: 127.5 mL/min (by C-G formula based on SCr of 0.79 mg/dL). Liver Function Tests: Recent Labs  Lab 10/14/22 0004 10/15/22 1118 10/16/22 0314  AST 15 13* 14*  ALT 10 9 10   ALKPHOS 101 82 89  BILITOT 1.1 0.8 0.9  PROT 7.2 6.3* 6.4*  ALBUMIN 3.7 3.1* 3.1*   Recent Labs  Lab 10/14/22 0004 10/15/22 1118  LIPASE 29 39  CBG: Recent Labs  Lab 10/16/22 0726  GLUCAP 188*  No results found for this or any previous visit (from the past 240 hour(s)).  Antimicrobials: Anti-infectives (From admission, onward)    Start     Dose/Rate Route Frequency Ordered Stop   10/15/22 2200  hydroxychloroquine (PLAQUENIL) tablet 200 mg        200 mg Oral 2 times daily 10/15/22 1609         LOS: 0 days   Antonieta Pert, MD [facility]  10/16/2022, 9:45 AM

## 2022-10-17 DIAGNOSIS — R1084 Generalized abdominal pain: Secondary | ICD-10-CM

## 2022-10-17 DIAGNOSIS — R112 Nausea with vomiting, unspecified: Secondary | ICD-10-CM | POA: Diagnosis not present

## 2022-10-17 LAB — GLUCOSE, CAPILLARY
Glucose-Capillary: 160 mg/dL — ABNORMAL HIGH (ref 70–99)
Glucose-Capillary: 197 mg/dL — ABNORMAL HIGH (ref 70–99)
Glucose-Capillary: 178 mg/dL — ABNORMAL HIGH (ref 70–99)
Glucose-Capillary: 178 mg/dL — ABNORMAL HIGH (ref 70–99)
Glucose-Capillary: 237 mg/dL — ABNORMAL HIGH (ref 70–99)

## 2022-10-17 LAB — BASIC METABOLIC PANEL
Anion gap: 12 (ref 5–15)
BUN: 12 mg/dL (ref 6–20)
CO2: 21 mmol/L — ABNORMAL LOW (ref 22–32)
Calcium: 8.6 mg/dL — ABNORMAL LOW (ref 8.9–10.3)
Chloride: 103 mmol/L (ref 98–111)
Creatinine, Ser: 1.03 mg/dL — ABNORMAL HIGH (ref 0.44–1.00)
GFR, Estimated: >60 mL/min (ref >60)
Glucose, Bld: 157 mg/dL — ABNORMAL HIGH (ref 70–99)
Potassium: 3.3 mmol/L — ABNORMAL LOW (ref 3.5–5.1)
Sodium: 136 mmol/L (ref 135–145)

## 2022-10-17 LAB — HEPATIC FUNCTION PANEL
ALT: 8 U/L (ref 0–44)
AST: 10 U/L — ABNORMAL LOW (ref 15–41)
Albumin: 2.8 g/dL — ABNORMAL LOW (ref 3.5–5.0)
Alkaline Phosphatase: 85 U/L (ref 38–126)
Bilirubin, Direct: 0.1 mg/dL (ref 0.0–0.2)
Indirect Bilirubin: 0.7 mg/dL (ref 0.3–0.9)
Total Bilirubin: 0.8 mg/dL (ref 0.3–1.2)
Total Protein: 6 g/dL — ABNORMAL LOW (ref 6.5–8.1)

## 2022-10-17 LAB — CORTISOL: Cortisol, Plasma: 5.1 ug/dL

## 2022-10-17 LAB — CBC
HCT: 33.3 % — ABNORMAL LOW (ref 36.0–46.0)
Hemoglobin: 10.9 g/dL — ABNORMAL LOW (ref 12.0–15.0)
MCH: 29.1 pg (ref 26.0–34.0)
MCHC: 32.7 g/dL (ref 30.0–36.0)
MCV: 89 fL (ref 80.0–100.0)
Platelets: 244 10*3/uL (ref 150–400)
RBC: 3.74 MIL/uL — ABNORMAL LOW (ref 3.87–5.11)
RDW: 14.2 % (ref 11.5–15.5)
WBC: 16.8 10*3/uL — ABNORMAL HIGH (ref 4.0–10.5)
nRBC: 0 % (ref 0.0–0.2)

## 2022-10-17 LAB — URINE CULTURE: Culture: 60000 — AB

## 2022-10-17 LAB — TSH: TSH: 3.163 u[IU]/mL (ref 0.350–4.500)

## 2022-10-17 MED ORDER — SODIUM CHLORIDE 0.9 % IV SOLN: INTRAVENOUS | Status: DC

## 2022-10-17 MED ORDER — POLYETHYLENE GLYCOL 3350 17 G PO PACK
17.0000 g | PACK | Freq: Every day | ORAL | Status: DC
  Administered 2022-10-17 – 2022-10-20 (×3): 17 g via ORAL
  Filled 2022-10-17 (×4): qty 1

## 2022-10-17 MED ORDER — ACETAMINOPHEN 500 MG PO TABS
1000.0000 mg | ORAL_TABLET | Freq: Four times a day (QID) | ORAL | Status: DC
  Administered 2022-10-17 – 2022-10-20 (×12): 1000 mg via ORAL
  Filled 2022-10-17 (×14): qty 2

## 2022-10-17 MED ORDER — SODIUM CHLORIDE 0.9 % IV BOLUS: 500.0000 mL | Freq: Once | INTRAVENOUS | Status: DC

## 2022-10-17 MED ORDER — COSYNTROPIN 0.25 MG IJ SOLR
0.2500 mg | Freq: Once | INTRAMUSCULAR | Status: AC
  Administered 2022-10-18: 0.25 mg via INTRAVENOUS
  Filled 2022-10-17: qty 0.25

## 2022-10-17 MED ORDER — SODIUM CHLORIDE 0.9 % IV BOLUS
500.0000 mL | Freq: Once | INTRAVENOUS | Status: AC
  Administered 2022-10-17: 250 mL via INTRAVENOUS

## 2022-10-17 MED ORDER — POTASSIUM CHLORIDE CRYS ER 20 MEQ PO TBCR
40.0000 meq | EXTENDED_RELEASE_TABLET | Freq: Once | ORAL | Status: AC
  Administered 2022-10-17: 40 meq via ORAL
  Filled 2022-10-17: qty 2

## 2022-10-17 NOTE — Plan of Care (Signed)
°  Problem: Education: °Goal: Knowledge of General Education information will improve °Description: Including pain rating scale, medication(s)/side effects and non-pharmacologic comfort measures °Outcome: Progressing °  °Problem: Clinical Measurements: °Goal: Cardiovascular complication will be avoided °Outcome: Progressing °  °Problem: Activity: °Goal: Risk for activity intolerance will decrease °Outcome: Progressing °  °

## 2022-10-17 NOTE — Progress Notes (Signed)
PROGRESS NOTE [name]  [id number] DOB: [birth year] DOA: 10/15/2022 PCP: Bartholome Bill, MD  Brief Narrative/Hospital Course: Per HPI: 52 y.o.f w/ diabetes with gastroparesis, history of PE, history of CVA, OHS, obesity, hypertension, hyperlipidemia, Bell's palsy, lupus, Sjogren's, anemia, depression, chronic systolic CHF presented with recurrent nausea and vomiting and abdominal pain on 10/15/22. She has had ongoing nausea and vomiting for the past several days.She has had 3 ED visits in the last 3 days.  3/15 she was seen and received a dose of droperidol with improving symptoms and was discharged home.  She was doing okay initially and then her symptoms returned overnight aso reports some ongoing chest pain that worsened with  tenderness to palpation of chest wall. She has been unable to keep down her medications for the past couple days.    ED Course: Vital signs in the ED significant for blood pressure in the [id number] to [id number] systolic, respiratory rate in the teens to 20s.  Lab workup included CMP with bicarb 18, creatinine stable 1.05, glucose 226, calcium 8.7, protein 6.3, albumin 3.1.  CBC with mild leukocytosis to 14.4, hemoglobin stable at 10.6.  Lipase negative, troponin negative x 2.  Urinalysis with glucose, ketones, leukocytes, bacteria.  Urine culture ordered Chest x-ray and CT abdomen pelvis 3/15- without acute abnormality.  Chest x-ray trepeat showed cardiomegaly with mild interstitial opacities possibly representing edema. EKG-showed QTc prolonged at 571.  Patient received Dilaudid, droperidol, Zofran, liter of IV fluids in the ED    Subjective: Seen and examined Overnight vitals/labs/events reviewed  Says could not take down grit yesterday Some nausea,vomited yesterday. Wbc down to 16k No BM but passing gas She has been Up and going to bathroom she says  Assessment and Plan: Principal Problem:   Intractable nausea and vomiting Active Problems:   SJOGREN'S SYNDROME    Chronic diastolic CHF (congestive heart failure) (HCC)   Morbid obesity (HCC)   Diabetic gastroparesis ([place])   HLD (hyperlipidemia)   Type 2 diabetes mellitus with hyperlipidemia (HCC)   Obesity hypoventilation syndrome (HCC)   Essential hypertension   History of pulmonary embolism   Normocytic anemia   History of CVA (cerebrovascular accident)   Major depressive disorder with single episode, in partial remission ([place])   SLE (systemic lupus erythematosus related syndrome) (HCC)   Intractable nausea vomiting recurrent problem dating back to 2014, has had 3 EGDs since 2014 History of gastroparesis: Gastric emptying study in 2021 was normal per GI. CT abdomen unremarkable, QTc stable on EKG,Patient not tolerating Reglan in the past.will manage conservatively with supportive measures and Zofran, ativan, dexamethasone -GI inputs appreciated, am cortisol  5.1, tsh normal.  I would have expected cortisol to be on higher side in the morning, I will order cosyntropin test. If symptoms not improving can consider repeat upper endoscopy, suspect polypharmacy is the major reason for issues as per GI and will need to follow-up with [place] team.  Continue gentle hydration.  Discussed about trying to schedule Tylenol and wean down on opiates.  Leukocytosis likely reactive/?  Etiology but downtrending,, afebrile, procalcitonin 0.18-trend.  Diabetes mellitus blood sugar well-controlled, continue SSI Recent Labs  Lab 10/16/22 0726 10/16/22 1125 10/16/22 1609 10/16/22 1928 10/17/22 0804  GLUCAP 188* 176* 169* 176* 197*   History of PE continue home Eliquis   Chronic combined systolic and diastolic CHF: Follows with Dr. Einar Gip, recent EF 2022 showed EF of 30-35% with G1 DD and severely reduced RV function.outpatient cardiology note menionted improved EF  in 2023 but cannont locate TTE.  Currently appears euvolemic no shortness of breath although chest x-ray with cardiomegaly mild changes that  could be consistent with edema.  Continue home torsemide Entresto Coreg, holding Aldactone and BiDil for now due to poor intake risk of dehydration.  Monitor fluid balance Daily weight as below Net IO Since Admission: No IO data has been entered for this period [10/17/22 0912]  Filed Weights   10/15/22 1013  Weight: (!) 149.7 kg    Hypertension: BP well-controlled on Coreg, Hyperlipidemia: Continue rosuvastatin History of stroke:Continue home rosuvastatin, Eliquis Hypokalemia replaced  Lupus Sjogren's: No issues currently continue home hydroxychloroquine, Lyrica and Is on belimumab injections outpatient   Anemia appears chronic:Stable hb will monitor Recent Labs  Lab 10/14/22 0004 10/15/22 1118 10/16/22 0314 10/16/22 1000 10/17/22 0412  HGB 12.0 10.6* 10.8* 10.7* 10.9*  HCT 37.5 33.4* 33.7* 31.9* 33.3*   Anxiety/depression -mood is stable, continue Seroquel ibuprofen, holding PRN Xanax while getting Ativan Morbid Obesity /W [id number] Body mass index is 50.18 kg/m. : Will benefit with PCP follow-up, weight loss  healthy lifestyle and outpatient sleep evaluation.  DVT prophylaxis: apixaban (ELIQUIS) tablet 2.5 mg Start: 10/15/22 2200 Code Status:   Code Status: Full Code Family Communication: plan of care discussed with patient at bedside. Patient status is: Inpatient because of intractable nausea vomiting Level of care: Telemetry Medical   Dispo: The patient is from: home            Anticipated disposition: TBD Objective: Vitals last 24 hrs: Vitals:   10/16/22 0723 10/16/22 1447 10/16/22 2040 10/17/22 0803  BP: 129/70 133/74 (!) 141/74 127/67  Pulse: 93 91 86 93  Resp:   16   Temp: 99 F (37.2 C) 98.6 F (37 C) 98.5 F (36.9 C) 98 F (36.7 C)  TempSrc: Oral Oral Oral Oral  SpO2: 99% 97% 100% 98%  Weight:      Height:       Weight change:   Physical Examination: General exam: alert awake, oriented, older than stated age HEENT:Oral mucosa moist, Ear/Nose WNL  grossly Respiratory system: Bilaterally clear BS, no use of accessory muscle Cardiovascular system: S1 & S2 +, No JVD. Gastrointestinal system: Abdomen soft, mildly tender in epigastric area, soft  obese,BS+ Nervous System: Alert, awake, moving extremities, shefollows commands. Extremities: LE edema neg,distal peripheral pulses palpable.  Skin: No rashes,no icterus. MSK: Normal muscle bulk,tone, power  Medications reviewed:  Scheduled Meds:  acetaminophen  1,000 mg Oral Q6H   apixaban  2.5 mg Oral BID   buPROPion  300 mg Oral Daily   carvedilol  37.5 mg Oral BID WC   cycloSPORINE  1 drop Both Eyes TID   hydroxychloroquine  200 mg Oral BID   insulin aspart  0-15 Units Subcutaneous TID WC   insulin aspart  0-5 Units Subcutaneous QHS   levothyroxine  50 mcg Oral Q0600   mometasone-formoterol  2 puff Inhalation BID   ondansetron (ZOFRAN) IV  4 mg Intravenous Q6H WA   pantoprazole (PROTONIX) IV  40 mg Intravenous Q12H   pregabalin  75 mg Oral TID   QUEtiapine  300 mg Oral QHS   rosuvastatin  20 mg Oral Daily   sacubitril-valsartan  1 tablet Oral BID   sodium chloride flush  3 mL Intravenous Q12H   topiramate  75 mg Oral QHS   torsemide  20 mg Oral QHS   Continuous Infusions:    Diet Order  Diet full liquid Room service appropriate? Yes; Fluid consistency: Thin  Diet effective now                 No intake or output data in the 24 hours ending 10/17/22 0912 Net IO Since Admission: No IO data has been entered for this period [10/17/22 0912]  Wt Readings from Last 3 Encounters:  10/15/22 (!) 149.7 kg  10/14/22 (!) 149.7 kg  10/13/22 (!) 149.7 kg     Unresulted Labs (From admission, onward)     Start     Ordered   10/17/22 [id number]  Basic metabolic panel  Daily,   R      10/16/22 0948   10/17/22 0500  CBC  Daily,   R      10/16/22 1304          Data Reviewed: I have personally reviewed following labs and imaging studies CBC: Recent Labs  Lab  10/14/22 0004 10/15/22 1118 10/16/22 0314 10/16/22 1000 10/17/22 0412  WBC 15.9* 14.4* 19.6* 18.1* 16.8*  NEUTROABS  --  11.5*  --   --   --   HGB 12.0 10.6* 10.8* 10.7* 10.9*  HCT 37.5 33.4* 33.7* 31.9* 33.3*  MCV 90.1 92.3 90.1 88.6 89.0  PLT 294 237 237 245 [id number]    Basic Metabolic Panel: Recent Labs  Lab 10/14/22 0004 10/15/22 1118 10/16/22 0314 10/17/22 0412  NA 140 137 138 136  K 3.9 3.8 4.0 3.3*  CL 108 107 108 103  CO2 19* 18* 18* 21*  GLUCOSE 212* 226* 169* 157*  BUN 15 16 13 12   CREATININE 1.05* 0.93 0.79 1.03*  CALCIUM 9.7 8.7* 8.9 8.6*    GFR: Estimated Creatinine Clearance: 99 mL/min (A) (by C-G formula based on SCr of 1.03 mg/dL (H)). Liver Function Tests: Recent Labs  Lab 10/14/22 0004 10/15/22 1118 10/16/22 0314 10/17/22 0412  AST 15 13* 14* 10*  ALT 10 9 10 8   ALKPHOS 101 82 89 85  BILITOT 1.1 0.8 0.9 0.8  PROT 7.2 6.3* 6.4* 6.0*  ALBUMIN 3.7 3.1* 3.1* 2.8*    Recent Labs  Lab 10/14/22 0004 10/15/22 1118  LIPASE 29 39   CBG: Recent Labs  Lab 10/16/22 0726 10/16/22 1125 10/16/22 1609 10/16/22 1928 10/17/22 0804  GLUCAP 188* 176* 169* 176* 197*   Recent Results (from the past 240 hour(s))  Urine Culture     Status: Abnormal   Collection Time: 10/15/22 11:53 AM   Specimen: Urine, Clean Catch  Result Value Ref Range Status   Specimen Description URINE, CLEAN CATCH  Final   Special Requests NONE  Final   Culture (A)  Final    60,000 COLONIES/mL STREPTOCOCCUS AGALACTIAE TESTING AGAINST S. AGALACTIAE NOT ROUTINELY PERFORMED DUE TO PREDICTABILITY OF AMP/PEN/VAN SUSCEPTIBILITY. Performed at [facility], [place] [address], [place], [place] [address]    Report Status 10/17/2022 FINAL  Final    Antimicrobials: Anti-infectives (From admission, onward)    Start     Dose/Rate Route Frequency Ordered Stop   10/15/22 2200  hydroxychloroquine (PLAQUENIL) tablet 200 mg        200 mg Oral 2 times daily 10/15/22 1609         LOS: 1  day   Antonieta Pert, MD [facility]  10/17/2022, 9:12 AM

## 2022-10-17 NOTE — Progress Notes (Addendum)
Progress Note  Primary GI: Unassigned ([facility])   Subjective  Chief Complaint:Intractable nausea/vomiting   No family was present at the time of my evaluation. Patient states she tried to eat some grits this morning but had gagging but no actual vomiting.  States she still has nausea but is beginning to improve. Patient states she would like to see how she does today and if not would be willing to undergo endoscopic evaluation for her irretractable nausea vomiting. States last bowel movement was still Wednesday. Has mild lower abdominal discomfort, no upper abdominal pain.  No dark black stools or blood in the stools. Denies fever or chills    Objective   Vital signs in last 24 hours: Temp:  [98 F (36.7 C)-98.6 F (37 C)] 98 F (36.7 C) (03/18 0803) Pulse Rate:  [86-93] 93 (03/18 0803) Resp:  [16] 16 (03/17 2040) BP: (127-141)/(67-74) 127/67 (03/18 0803) SpO2:  [97 %-100 %] 98 % (03/18 0803)   Last BM recorded by nurses in past 5 days No data recorded  General:   female in no acute distress  Heart:  Regular rate and rhythm; no murmurs Pulm: Clear anteriorly; no wheezing Abdomen:  Soft, Obese AB, Active bowel sounds. mild tenderness in the epigastrium and in the lower abdomen. Without guarding and Without rebound, No organomegaly appreciated. Extremities:  without  edema. Neurologic:  Alert and  oriented x4;  No focal deficits.  Lethargic Psych:  Cooperative. Normal mood and affect.  Intake/Output from previous day: No intake/output data recorded. Intake/Output this shift: No intake/output data recorded.  Studies/Results: No results found.  Lab Results: Recent Labs    10/16/22 0314 10/16/22 1000 10/17/22 0412  WBC 19.6* 18.1* 16.8*  HGB 10.8* 10.7* 10.9*  HCT 33.7* 31.9* 33.3*  PLT 237 245 244   BMET Recent Labs    10/15/22 1118 10/16/22 0314 10/17/22 0412  NA 137 138 136  K 3.8 4.0 3.3*  CL 107 108 103  CO2 18* 18* 21*  GLUCOSE 226* 169* 157*   BUN 16 13 12   CREATININE 0.93 0.79 1.03*  CALCIUM 8.7* 8.9 8.6*   LFT Recent Labs    10/17/22 0412  PROT 6.0*  ALBUMIN 2.8*  AST 10*  ALT 8  ALKPHOS 85  BILITOT 0.8  BILIDIR 0.1  IBILI 0.7   PT/INR No results for input(s): "LABPROT", "INR" in the last 72 hours.   Scheduled Meds:  acetaminophen  1,000 mg Oral Q6H   apixaban  2.5 mg Oral BID   buPROPion  300 mg Oral Daily   carvedilol  37.5 mg Oral BID WC   [START ON 10/18/2022] cosyntropin  0.25 mg Intravenous Once   cycloSPORINE  1 drop Both Eyes TID   hydroxychloroquine  200 mg Oral BID   insulin aspart  0-15 Units Subcutaneous TID WC   insulin aspart  0-5 Units Subcutaneous QHS   levothyroxine  50 mcg Oral Q0600   mometasone-formoterol  2 puff Inhalation BID   ondansetron (ZOFRAN) IV  4 mg Intravenous Q6H WA   pantoprazole (PROTONIX) IV  40 mg Intravenous Q12H   polyethylene glycol  17 g Oral Daily   pregabalin  75 mg Oral TID   QUEtiapine  300 mg Oral QHS   rosuvastatin  20 mg Oral Daily   sacubitril-valsartan  1 tablet Oral BID   sodium chloride flush  3 mL Intravenous Q12H   topiramate  75 mg Oral QHS   torsemide  20 mg Oral QHS  Continuous Infusions:    Patient profile:   52 y.o. female with diabetes, history of PE on Eliquis, history of CVA, OHS, obesity, hypertension, hyperlipidemia, Bell's palsy, lupus, Sjogren's, chronic anemia, depression, chronic systolic CHF presented with recurrent nausea and vomiting and abdominal pain on 10/15/22.  CT scan abdomen/pelvis with contrast shows a small hiatal hernia, no acute intra-abdominal process. GES in 2021 normal.  HIDA scan in 01/2021 normal. EGD 01/2021 by Dr. Michail Sermon for complaints of nausea/vomiting with acute gastritis.  No biopsies obtained. EGD by Dr. Ardis Hughs in 2016 for complaints of nausea vomiting that was normal.   Impression/Plan:   Recurrent nausea and vomiting since at least 2014 Normal gastric emptying study 11/2019 Previous reaction to  metoclopramide CT scan on admission unremarkable 3 endoscopies since 2014, 1 did show esophagitis, most recent with Dr. Michail Sermon 2022 acute gastritis, no biopsies obtained. Does appear to be worse with solid foods and patient is tolerating liquids well Patient is been on Ativan, scopolamine patch, Protonix IV twice daily  -Will hold Eliquis this evening, will let patient try lunch and dinner, however if she continues to have vomiting with solid foods we will plan for endoscopic evaluation tomorrow Patient is agreeable with this Pending further recommendations per Dr. Silverio Decamp Consider outpatient GES Improve bowel regimen as this may aid nausea and vomiting If everything is unremarkable, agree with high suspicion of polypharmacy.  History of PE Received Eliquis this morning, will hold this evening's dose .  Patient does today with food, tentative EGD for tomorrow.  Constipation Seems more chronic as well 1-2 bowel movements daily Continue glycerin suppositories, MiraLAX daily Consider Linzess  Chronic anemia stable, no signs of overt GI bleeding Normal BUN CBC on 10/17/2022   HGB 10.9 MCV 89.0 Platelets 244 Anemia studies on 12/30/2021  Iron 66 Ferritin 159 B12 163  Leukocytosis 10/17/2022 WBC 16.8  (18.1)-downtrending Afebrile   Principal Problem:   Intractable nausea and vomiting Active Problems:   SJOGREN'S SYNDROME   Chronic diastolic CHF (congestive heart failure) (HCC)   Morbid obesity (HCC)   Diabetic gastroparesis (HCC)   HLD (hyperlipidemia)   Type 2 diabetes mellitus with hyperlipidemia (HCC)   Obesity hypoventilation syndrome (HCC)   Essential hypertension   History of pulmonary embolism   Normocytic anemia   History of CVA (cerebrovascular accident)   Major depressive disorder with single episode, in partial remission ([place])   SLE (systemic lupus erythematosus related syndrome) ([place])    LOS: 1 day   Vladimir Crofts  10/17/2022, 11:54 AM   Attending  physician's note   I have taken a history, reviewed the chart and examined the patient. I performed a substantive portion of this encounter, including complete performance of at least one of the key components, in conjunction with the APP. I agree with the APP's note, impression and recommendations.    Acute on chronic nausea and vomiting Generalized abdominal discomfort  She has had extensive GI workup in the past with no significant GI pathology Leukocytosis unclear etiology, possible secondary to SIRS.  She is afebrile  History of PE, on Eliquis for anticoagulation   Will plan for EGD tomorrow to evaluate persistent nausea, vomiting and abdominal pain, exclude peptic ulcer disease or gastric outlet obstruction N.p.o. after midnight  The patient was provided an opportunity to ask questions and all were answered. The patient agreed with the plan and demonstrated an understanding of the instructions.   Damaris Hippo , MD [phone]

## 2022-10-17 NOTE — H&P (View-Only) (Signed)
Progress Note  Primary GI: Unassigned ([facility])   Subjective  Chief Complaint:Intractable nausea/vomiting   No family was present at the time of my evaluation. Patient states she tried to eat some grits this morning but had gagging but no actual vomiting.  States she still has nausea but is beginning to improve. Patient states she would like to see how she does today and if not would be willing to undergo endoscopic evaluation for her irretractable nausea vomiting. States last bowel movement was still Wednesday. Has mild lower abdominal discomfort, no upper abdominal pain.  No dark black stools or blood in the stools. Denies fever or chills    Objective   Vital signs in last 24 hours: Temp:  [98 F (36.7 C)-98.6 F (37 C)] 98 F (36.7 C) (03/18 0803) Pulse Rate:  [86-93] 93 (03/18 0803) Resp:  [16] 16 (03/17 2040) BP: (127-141)/(67-74) 127/67 (03/18 0803) SpO2:  [97 %-100 %] 98 % (03/18 0803)   Last BM recorded by nurses in past 5 days No data recorded  General:   female in no acute distress  Heart:  Regular rate and rhythm; no murmurs Pulm: Clear anteriorly; no wheezing Abdomen:  Soft, Obese AB, Active bowel sounds. mild tenderness in the epigastrium and in the lower abdomen. Without guarding and Without rebound, No organomegaly appreciated. Extremities:  without  edema. Neurologic:  Alert and  oriented x4;  No focal deficits.  Lethargic Psych:  Cooperative. Normal mood and affect.  Intake/Output from previous day: No intake/output data recorded. Intake/Output this shift: No intake/output data recorded.  Studies/Results: No results found.  Lab Results: Recent Labs    10/16/22 0314 10/16/22 1000 10/17/22 0412  WBC 19.6* 18.1* 16.8*  HGB 10.8* 10.7* 10.9*  HCT 33.7* 31.9* 33.3*  PLT 237 245 244   BMET Recent Labs    10/15/22 1118 10/16/22 0314 10/17/22 0412  NA 137 138 136  K 3.8 4.0 3.3*  CL 107 108 103  CO2 18* 18* 21*  GLUCOSE 226* 169* 157*   BUN 16 13 12   CREATININE 0.93 0.79 1.03*  CALCIUM 8.7* 8.9 8.6*   LFT Recent Labs    10/17/22 0412  PROT 6.0*  ALBUMIN 2.8*  AST 10*  ALT 8  ALKPHOS 85  BILITOT 0.8  BILIDIR 0.1  IBILI 0.7   PT/INR No results for input(s): "LABPROT", "INR" in the last 72 hours.   Scheduled Meds:  acetaminophen  1,000 mg Oral Q6H   apixaban  2.5 mg Oral BID   buPROPion  300 mg Oral Daily   carvedilol  37.5 mg Oral BID WC   [START ON 10/18/2022] cosyntropin  0.25 mg Intravenous Once   cycloSPORINE  1 drop Both Eyes TID   hydroxychloroquine  200 mg Oral BID   insulin aspart  0-15 Units Subcutaneous TID WC   insulin aspart  0-5 Units Subcutaneous QHS   levothyroxine  50 mcg Oral Q0600   mometasone-formoterol  2 puff Inhalation BID   ondansetron (ZOFRAN) IV  4 mg Intravenous Q6H WA   pantoprazole (PROTONIX) IV  40 mg Intravenous Q12H   polyethylene glycol  17 g Oral Daily   pregabalin  75 mg Oral TID   QUEtiapine  300 mg Oral QHS   rosuvastatin  20 mg Oral Daily   sacubitril-valsartan  1 tablet Oral BID   sodium chloride flush  3 mL Intravenous Q12H   topiramate  75 mg Oral QHS   torsemide  20 mg Oral QHS  Continuous Infusions:    Patient profile:   52 y.o. female with diabetes, history of PE on Eliquis, history of CVA, OHS, obesity, hypertension, hyperlipidemia, Bell's palsy, lupus, Sjogren's, chronic anemia, depression, chronic systolic CHF presented with recurrent nausea and vomiting and abdominal pain on 10/15/22.  CT scan abdomen/pelvis with contrast shows a small hiatal hernia, no acute intra-abdominal process. GES in 2021 normal.  HIDA scan in 01/2021 normal. EGD 01/2021 by Dr. Michail Sermon for complaints of nausea/vomiting with acute gastritis.  No biopsies obtained. EGD by Dr. Ardis Hughs in 2016 for complaints of nausea vomiting that was normal.   Impression/Plan:   Recurrent nausea and vomiting since at least 2014 Normal gastric emptying study 11/2019 Previous reaction to  metoclopramide CT scan on admission unremarkable 3 endoscopies since 2014, 1 did show esophagitis, most recent with Dr. Michail Sermon 2022 acute gastritis, no biopsies obtained. Does appear to be worse with solid foods and patient is tolerating liquids well Patient is been on Ativan, scopolamine patch, Protonix IV twice daily  -Will hold Eliquis this evening, will let patient try lunch and dinner, however if she continues to have vomiting with solid foods we will plan for endoscopic evaluation tomorrow Patient is agreeable with this Pending further recommendations per Dr. Silverio Decamp Consider outpatient GES Improve bowel regimen as this may aid nausea and vomiting If everything is unremarkable, agree with high suspicion of polypharmacy.  History of PE Received Eliquis this morning, will hold this evening's dose .  Patient does today with food, tentative EGD for tomorrow.  Constipation Seems more chronic as well 1-2 bowel movements daily Continue glycerin suppositories, MiraLAX daily Consider Linzess  Chronic anemia stable, no signs of overt GI bleeding Normal BUN CBC on 10/17/2022   HGB 10.9 MCV 89.0 Platelets 244 Anemia studies on 12/30/2021  Iron 66 Ferritin 159 B12 163  Leukocytosis 10/17/2022 WBC 16.8  (18.1)-downtrending Afebrile   Principal Problem:   Intractable nausea and vomiting Active Problems:   SJOGREN'S SYNDROME   Chronic diastolic CHF (congestive heart failure) (HCC)   Morbid obesity (HCC)   Diabetic gastroparesis (HCC)   HLD (hyperlipidemia)   Type 2 diabetes mellitus with hyperlipidemia (HCC)   Obesity hypoventilation syndrome (HCC)   Essential hypertension   History of pulmonary embolism   Normocytic anemia   History of CVA (cerebrovascular accident)   Major depressive disorder with single episode, in partial remission ([place])   SLE (systemic lupus erythematosus related syndrome) ([place])    LOS: 1 day   Vladimir Crofts  10/17/2022, 11:54 AM   Attending  physician's note   I have taken a history, reviewed the chart and examined the patient. I performed a substantive portion of this encounter, including complete performance of at least one of the key components, in conjunction with the APP. I agree with the APP's note, impression and recommendations.    Acute on chronic nausea and vomiting Generalized abdominal discomfort  She has had extensive GI workup in the past with no significant GI pathology Leukocytosis unclear etiology, possible secondary to SIRS.  She is afebrile  History of PE, on Eliquis for anticoagulation   Will plan for EGD tomorrow to evaluate persistent nausea, vomiting and abdominal pain, exclude peptic ulcer disease or gastric outlet obstruction N.p.o. after midnight  The patient was provided an opportunity to ask questions and all were answered. The patient agreed with the plan and demonstrated an understanding of the instructions.   Damaris Hippo , MD [phone]

## 2022-10-18 ENCOUNTER — Inpatient Hospital Stay (HOSPITAL_COMMUNITY): Payer: Medicaid Other | Admitting: Certified Registered Nurse Anesthetist

## 2022-10-18 ENCOUNTER — Encounter (HOSPITAL_COMMUNITY): Payer: Self-pay | Admitting: Internal Medicine

## 2022-10-18 ENCOUNTER — Encounter (HOSPITAL_COMMUNITY)
Admission: EM | Disposition: A | Discharge status: Home / Self Care | Payer: Self-pay | Source: Home / Self Care | Attending: Internal Medicine

## 2022-10-18 DIAGNOSIS — R112 Nausea with vomiting, unspecified: Secondary | ICD-10-CM | POA: Diagnosis not present

## 2022-10-18 LAB — GLUCOSE, CAPILLARY
Glucose-Capillary: 221 mg/dL — ABNORMAL HIGH (ref 70–99)
Glucose-Capillary: 299 mg/dL — ABNORMAL HIGH (ref 70–99)
Glucose-Capillary: 165 mg/dL — ABNORMAL HIGH (ref 70–99)
Glucose-Capillary: 184 mg/dL — ABNORMAL HIGH (ref 70–99)

## 2022-10-18 LAB — BASIC METABOLIC PANEL
Anion gap: 12 (ref 5–15)
BUN: 16 mg/dL (ref 6–20)
CO2: 20 mmol/L — ABNORMAL LOW (ref 22–32)
Calcium: 8.3 mg/dL — ABNORMAL LOW (ref 8.9–10.3)
Chloride: 106 mmol/L (ref 98–111)
Creatinine, Ser: 1.31 mg/dL — ABNORMAL HIGH (ref 0.44–1.00)
GFR, Estimated: 49 mL/min — ABNORMAL LOW (ref >60)
Glucose, Bld: 198 mg/dL — ABNORMAL HIGH (ref 70–99)
Potassium: 3.5 mmol/L (ref 3.5–5.1)
Sodium: 138 mmol/L (ref 135–145)

## 2022-10-18 LAB — CBC
HCT: 31.4 % — ABNORMAL LOW (ref 36.0–46.0)
Hemoglobin: 10.5 g/dL — ABNORMAL LOW (ref 12.0–15.0)
MCH: 29.4 pg (ref 26.0–34.0)
MCHC: 33.4 g/dL (ref 30.0–36.0)
MCV: 88 fL (ref 80.0–100.0)
Platelets: 221 10*3/uL (ref 150–400)
RBC: 3.57 MIL/uL — ABNORMAL LOW (ref 3.87–5.11)
RDW: 14 % (ref 11.5–15.5)
WBC: 13.8 10*3/uL — ABNORMAL HIGH (ref 4.0–10.5)
nRBC: 0 % (ref 0.0–0.2)

## 2022-10-18 LAB — ACTH STIMULATION, 3 TIME POINTS
Cortisol, 30 Min: 19.7 ug/dL
Cortisol, 60 Min: 22 ug/dL
Cortisol, Base: 7.7 ug/dL

## 2022-10-18 SURGERY — CANCELLED PROCEDURE
Anesthesia: Monitor Anesthesia Care

## 2022-10-18 MED ORDER — APIXABAN 2.5 MG PO TABS
2.5000 mg | ORAL_TABLET | Freq: Two times a day (BID) | ORAL | Status: DC
  Administered 2022-10-18 – 2022-10-20 (×4): 2.5 mg via ORAL
  Filled 2022-10-18 (×4): qty 1

## 2022-10-18 MED ORDER — INSULIN GLARGINE-YFGN 100 UNIT/ML ~~LOC~~ SOLN
10.0000 [IU] | Freq: Every day | SUBCUTANEOUS | Status: DC
  Administered 2022-10-18 – 2022-10-20 (×3): 10 [IU] via SUBCUTANEOUS
  Filled 2022-10-18 (×3): qty 0.1

## 2022-10-18 MED ORDER — LACTATED RINGERS IV SOLN: Freq: Once | INTRAVENOUS | Status: DC

## 2022-10-18 SURGICAL SUPPLY — 15 items

## 2022-10-18 NOTE — Progress Notes (Signed)
PROGRESS NOTE [name]  [id number] DOB: [birth year] DOA: 10/15/2022 PCP: Bartholome Bill, MD  Brief Narrative/Hospital Course: Per HPI: 52 y.o.f w/ diabetes with gastroparesis, history of PE, history of CVA, OHS, obesity, hypertension, hyperlipidemia, Bell's palsy, lupus, Sjogren's, anemia, depression, chronic systolic CHF presented with recurrent nausea and vomiting and abdominal pain on 10/15/22. She has had ongoing nausea and vomiting for the past several days.She has had 3 ED visits in the last 3 days.  3/15 she was seen and received a dose of droperidol with improving symptoms and was discharged home.  She was doing okay initially and then her symptoms returned overnight aso reports some ongoing chest pain that worsened with  tenderness to palpation of chest wall. She has been unable to keep down her medications for the past couple days.    ED Course: Vital signs in the ED significant for blood pressure in the [id number] systolic, respiratory rate in the teens to 20s.  Lab workup included CMP with bicarb 18, creatinine stable 1.05, glucose 226, calcium 8.7, protein 6.3, albumin 3.1.  CBC with mild leukocytosis to 14.4, hemoglobin stable at 10.6.  Lipase negative, troponin negative x 2.  Urinalysis with glucose, ketones, leukocytes, bacteria.  Urine culture ordered Chest x-ray and CT abdomen pelvis 3/15- without acute abnormality.  Chest x-ray trepeat showed cardiomegaly with mild interstitial opacities possibly representing edema. EKG-showed QTc prolonged at 571.  Patient received Dilaudid, droperidol, Zofran, liter of IV fluids in the ED    Subjective: Seen and examined this morning complains of nausea. Did not get endoscopy as there was issue with consent Patient reports he wanted to find out more about EGD before signing consent while she was down there. Of note she has had egd 3 times in the past-but reports she does not remember those C/O Epigastric abdominal  discomfort  Assessment and Plan: Principal Problem:   Intractable nausea and vomiting Active Problems:   SJOGREN'S SYNDROME   Chronic diastolic CHF (congestive heart failure) (HCC)   Morbid obesity (HCC)   Generalized abdominal pain   Diabetic gastroparesis (HCC)   HLD (hyperlipidemia)   Type 2 diabetes mellitus with hyperlipidemia (HCC)   Obesity hypoventilation syndrome (HCC)   Essential hypertension   History of pulmonary embolism   Normocytic anemia   History of CVA (cerebrovascular accident)   Major depressive disorder with single episode, in partial remission ([place])   SLE (systemic lupus erythematosus related syndrome) (HCC)   Intractable nausea vomiting recurrent problem dating back to 2014, has had 3 EGDs since 2014 History of gastroparesis: Gastric emptying study in 2021 was normal per GI. CT abdomen unremarkable, QTc stable on EKG,Patient not tolerating Reglan in the past.am cortisol  5.1, tsh normal.-Cosyntropin test is normal rule out adrenal insufficiency.  GI following closely, EGD canceled this morning due to consent issues/patient did not want to proceed. GI following and suspect polypharmacy is the major reason for issues as per GI and will need to follow-up with [facility] team on dc.Discussed about trying to schedule Tylenol and wean down on opiates cont on symptomatic management, PPI twice daily, prn Zofran, ativan.  Leukocytosis likely reactive/?  Etiology but downtrending,, afebrile, procalcitonin 0.18-trend. Recent Labs  Lab 10/15/22 1118 10/16/22 0314 10/16/22 1000 10/17/22 0412 10/18/22 0453  WBC 14.4* 19.6* 18.1* 16.8* 13.8*    Diabetes mellitus blood sugar poorly controlled-DM control following PTA on U-500 100 units 3 times daily metformin or Ozempic Jardiance, add Semglee 10 U. Recent Labs  Lab 10/17/22 0804 10/17/22 1127 10/17/22 1648 10/17/22 2106 10/18/22 0730  GLUCAP 197* 178* 178* 237* 221*   History of PE continue home Eliquis.    Chronic combined systolic and diastolic CHF: Follows with Dr. Einar Gip, recent EF 2022 showed EF of 30-35% with G1 DD and severely reduced RV function.outpatient cardiology note menionted improved EF in 2023 but cannont locate TTE.  Currently appears euvolemic no shortness of breath although chest x-ray with cardiomegaly mild changes that could be consistent with edema. Creat is up further>Continue home torsemide. hold Entresto, cont Coreg, holding Aldactone and BiDil for now.Monitor fluid balance Daily weight as below. Net IO Since Admission: -419.32 mL [10/18/22 1054]  Filed Weights   10/15/22 1013 10/18/22 0809  Weight: (!) 149.7 kg (!) 140.6 kg    Hypertension: BP stable cont Coreg, Hyperlipidemia: Continue rosuvastatin History of stroke:Continue home rosuvastatin, Eliquis Hypokalemia replaced  Lupus Sjogren's: No issues currently continue home hydroxychloroquine, Lyrica and Is on belimumab injections outpatient   Anemia appears chronic:Stable hb will monitor Recent Labs  Lab 10/15/22 1118 10/16/22 0314 10/16/22 1000 10/17/22 0412 10/18/22 0453  HGB 10.6* 10.8* 10.7* 10.9* 10.5*  HCT 33.4* 33.7* 31.9* 33.3* 31.4*   Anxiety/depression -mood is stable, continue Seroquel ibuprofen, holding PRN Xanax while getting Ativan Morbid Obesity /W [id number] Body mass index is 47.14 kg/m. : Will benefit with PCP follow-up, weight loss  healthy lifestyle and outpatient sleep evaluation.  DVT prophylaxis:  Code Status:   Code Status: Full Code Family Communication: plan of care discussed with patient at bedside. Patient status is: Inpatient because of intractable nausea vomiting Level of care: Telemetry Medical   Dispo: The patient is from: home            Anticipated disposition: TBD Objective: Vitals last 24 hrs: Vitals:   10/18/22 0046 10/18/22 0418 10/18/22 0729 10/18/22 0809  BP: 112/67 129/70 101/64 125/68  Pulse: 78 80 82 85  Resp: 10 16  15   Temp:  98 F (36.7 C)  (!)  97.5 F (36.4 C)  TempSrc:    Tympanic  SpO2: 100% 100% 98% 100%  Weight:    (!) 140.6 kg  Height:    5\' 8"  (1.727 m)   Weight change:   Physical Examination: General exam: Aaox3, obese,weak,older appearing HEENT:Oral mucosa moist, Ear/Nose WNL grossly, dentition normal. Respiratory system: bilaterally diminished BS, no use of accessory muscle Cardiovascular system: S1 & S2 +, regular rate,. Gastrointestinal system: Abdomen soft, obese, some tenderness in the epigastrium ,ND,BS+ Nervous System:Alert, awake, moving extremities and grossly nonfocal Extremities: LE ankle edema neg, lower extremities warm Skin: No rashes,no icterus. MSK: Normal muscle bulk,tone, power   Medications reviewed:  Scheduled Meds:  acetaminophen  1,000 mg Oral Q6H   buPROPion  300 mg Oral Daily   carvedilol  37.5 mg Oral BID WC   cycloSPORINE  1 drop Both Eyes TID   hydroxychloroquine  200 mg Oral BID   insulin aspart  0-15 Units Subcutaneous TID WC   insulin aspart  0-5 Units Subcutaneous QHS   levothyroxine  50 mcg Oral Q0600   mometasone-formoterol  2 puff Inhalation BID   ondansetron (ZOFRAN) IV  4 mg Intravenous Q6H WA   pantoprazole (PROTONIX) IV  40 mg Intravenous Q12H   polyethylene glycol  17 g Oral Daily   pregabalin  75 mg Oral TID   QUEtiapine  300 mg Oral QHS   rosuvastatin  20 mg Oral Daily   sodium chloride flush  3 mL Intravenous  Q12H   topiramate  75 mg Oral QHS   torsemide  20 mg Oral QHS   Continuous Infusions:    Diet Order             Diet clear liquid Room service appropriate? Yes; Fluid consistency: Thin  Diet effective now                  Intake/Output Summary (Last 24 hours) at 10/18/2022 1054 Last data filed at 10/18/2022 0745 Gross per 24 hour  Intake 380.68 ml  Output 800 ml  Net -419.32 ml   Net IO Since Admission: -419.32 mL [10/18/22 1054]  Wt Readings from Last 3 Encounters:  10/18/22 (!) 140.6 kg  10/14/22 (!) 149.7 kg  10/13/22 (!) 149.7 kg      Unresulted Labs (From admission, onward)     Start     Ordered   10/17/22 [id number]  Basic metabolic panel  Daily,   R      10/16/22 0948   10/17/22 0500  CBC  Daily,   R      10/16/22 1304          Data Reviewed: I have personally reviewed following labs and imaging studies CBC: Recent Labs  Lab 10/15/22 1118 10/16/22 0314 10/16/22 1000 10/17/22 0412 10/18/22 0453  WBC 14.4* 19.6* 18.1* 16.8* 13.8*  NEUTROABS 11.5*  --   --   --   --   HGB 10.6* 10.8* 10.7* 10.9* 10.5*  HCT 33.4* 33.7* 31.9* 33.3* 31.4*  MCV 92.3 90.1 88.6 89.0 88.0  PLT 237 237 245 244 [id number]    Basic Metabolic Panel: Recent Labs  Lab 10/14/22 0004 10/15/22 1118 10/16/22 0314 10/17/22 0412 10/18/22 0453  NA 140 137 138 136 138  K 3.9 3.8 4.0 3.3* 3.5  CL 108 107 108 103 106  CO2 19* 18* 18* 21* 20*  GLUCOSE 212* 226* 169* 157* 198*  BUN 15 16 13 12 16   CREATININE 1.05* 0.93 0.79 1.03* 1.31*  CALCIUM 9.7 8.7* 8.9 8.6* 8.3*    GFR: Estimated Creatinine Clearance: 75 mL/min (A) (by C-G formula based on SCr of 1.31 mg/dL (H)). Liver Function Tests: Recent Labs  Lab 10/14/22 0004 10/15/22 1118 10/16/22 0314 10/17/22 0412  AST 15 13* 14* 10*  ALT 10 9 10 8   ALKPHOS 101 82 89 85  BILITOT 1.1 0.8 0.9 0.8  PROT 7.2 6.3* 6.4* 6.0*  ALBUMIN 3.7 3.1* 3.1* 2.8*    Recent Labs  Lab 10/14/22 0004 10/15/22 1118  LIPASE 29 39   CBG: Recent Labs  Lab 10/17/22 0804 10/17/22 1127 10/17/22 1648 10/17/22 2106 10/18/22 0730  GLUCAP 197* 178* 178* 237* 221*    Recent Results (from the past 240 hour(s))  Urine Culture     Status: Abnormal   Collection Time: 10/15/22 11:53 AM   Specimen: Urine, Clean Catch  Result Value Ref Range Status   Specimen Description URINE, CLEAN CATCH  Final   Special Requests NONE  Final   Culture (A)  Final    60,000 COLONIES/mL STREPTOCOCCUS AGALACTIAE TESTING AGAINST S. AGALACTIAE NOT ROUTINELY PERFORMED DUE TO PREDICTABILITY OF AMP/PEN/VAN  SUSCEPTIBILITY. Performed at [facility], [place] [address] [place], [place] [address]    Report Status 10/17/2022 FINAL  Final    Antimicrobials: Anti-infectives (From admission, onward)    Start     Dose/Rate Route Frequency Ordered Stop   10/15/22 2200  hydroxychloroquine (PLAQUENIL) tablet 200 mg  200 mg Oral 2 times daily 10/15/22 1609         LOS: 2 days   Antonieta Pert, MD [facility]  10/18/2022, 10:54 AM

## 2022-10-18 NOTE — Interval H&P Note (Signed)
History and Physical Interval Note:  10/18/2022 7:40 AM  [name]  has presented today for surgery, with the diagnosis of Intractable nausea and vomiting.  The various methods of treatment have been discussed with the patient and family. After consideration of risks, benefits and other options for treatment, the patient has consented to  Procedure(s): ESOPHAGOGASTRODUODENOSCOPY (EGD) WITH PROPOFOL (N/A) as a surgical intervention.  The patient's history has been reviewed, patient examined, no change in status, stable for surgery.  I have reviewed the patient's chart and labs.  Questions were answered to the patient's satisfaction.     Terryann Verbeek

## 2022-10-18 NOTE — Inpatient Diabetes Management (Signed)
Inpatient Diabetes Program Recommendations  AACE/ADA: New Consensus Statement on Inpatient Glycemic Control (2015)  Target Ranges:  Prepandial:   less than 140 mg/dL      Peak postprandial:   less than 180 mg/dL (1-2 hours)      Critically ill patients:  140 - 180 mg/dL   Lab Results  Component Value Date   GLUCAP 221 (H) 10/18/2022   HGBA1C 7.4 (H) 05/23/2022    Review of Glycemic Control  Latest Reference Range & Units 10/17/22 08:04 10/17/22 11:27 10/17/22 16:48 10/17/22 21:06 10/18/22 07:30  Glucose-Capillary 70 - 99 mg/dL 197 (H) 178 (H) 178 (H) 237 (H) 221 (H)  (H): Data is abnormally high Diabetes history: Type 2 DM Outpatient Diabetes medications: U-500 100 units TID, Metformin 1000 mg BID, Ozempic 2 mg qwk, Jardiance 10 mg QD Current orders for Inpatient glycemic control: Novolog 0-15 units TID & HS  Inpatient Diabetes Program Recommendations:    Consider adding Semglee 12 units QD.   Thanks, Bronson Curb, MSN, RNC-OB Diabetes Coordinator [phone] (8a-5p)

## 2022-10-18 NOTE — Progress Notes (Signed)
Patient did not want to proceed with upper endoscopy, is worried about potential complications with anesthesia and the procedure.  Nausea and vomiting has improved  Patient does not recall having 3 EGDs previously, explained to her the findings and most recent was less than 2 years ago.  Suspicion for any significant pathology or malignancy is extremely low.  Advance diet as tolerated.  Small meals Good glycemic control Avoid narcotics Continue PPI twice daily  GI signing off, please call with any questions  K. Denzil Magnuson , MD [phone]

## 2022-10-18 NOTE — Plan of Care (Signed)
  Problem: Activity: Goal: Risk for activity intolerance will decrease Outcome: Progressing   Problem: Nutrition: Goal: Adequate nutrition will be maintained Outcome: Progressing   

## 2022-10-18 NOTE — Anesthesia Preprocedure Evaluation (Signed)
Anesthesia Evaluation  Patient identified by MRN, date of birth, ID band Patient awake    Reviewed: Allergy & Precautions, NPO status , Patient's Chart, lab work & pertinent test results  Airway Mallampati: III  TM Distance: >3 FB Neck ROM: Full    Dental  (+) Teeth Intact   Pulmonary sleep apnea, Continuous Positive Airway Pressure Ventilation and Oxygen sleep apnea , COPD,  COPD inhaler, former smoker    + decreased breath sounds      Cardiovascular hypertension, Pt. on medications and Pt. on home beta blockers +CHF   Rhythm:Regular Rate:Normal     Neuro/Psych  Headaches  Anxiety     History of Bells CVA (2010, 2014, left sided weakness), Residual Symptoms    GI/Hepatic Neg liver ROS,GERD  Medicated,,Nausea, vomiting   Endo/Other  diabetes, Poorly Controlled, Type 2, Insulin Dependent, Oral Hypoglycemic Agents  Morbid obesitySLE  Renal/GU Renal disease  negative genitourinary   Musculoskeletal negative musculoskeletal ROS (+)    Abdominal  (+) + obese  Peds  Hematology  (+) Blood dyscrasia, anemia   Anesthesia Other Findings   Reproductive/Obstetrics                              Anesthesia Physical Anesthesia Plan  ASA: 3  Anesthesia Plan: MAC   Post-op Pain Management:    Induction: Intravenous  PONV Risk Score and Plan: 2 and Propofol infusion and Treatment may vary due to age or medical condition  Airway Management Planned: Simple Face Mask, Natural Airway and Nasal Cannula  Additional Equipment: None  Intra-op Plan:   Post-operative Plan:   Informed Consent: I have reviewed the patients History and Physical, chart, labs and discussed the procedure including the risks, benefits and alternatives for the proposed anesthesia with the patient or authorized representative who has indicated his/her understanding and acceptance.     Dental advisory given  Plan Discussed  with: CRNA  Anesthesia Plan Comments: (Lab Results      Component                Value               Date                      WBC                      10.1                02/20/2021                HGB                      10.9 (L)            02/20/2021                HCT                      33.6 (L)            02/20/2021                MCV                      86.8                02/20/2021  PLT                                          02/20/2021            PLATELET CLUMPS NOTED ON SMEAR, UNABLE TO ESTIMATE Lab Results      Component                Value               Date                      NA                       135                 02/23/2021                K                        3.8                 02/23/2021                CO2                      22                  02/23/2021                GLUCOSE                  202 (H)             02/23/2021                BUN                      6                   02/23/2021                CREATININE               0.88                02/23/2021                CALCIUM                  8.4 (L)             02/23/2021                GFRNONAA                 >60                 02/23/2021                GFRAA                    >60                 02/07/2020           ECHO 07/21: IMPRESSIONS   1. Left ventricular ejection fraction, by  estimation, is 40 to 45%. The  left ventricle has mildly decreased function. The left ventricle  demonstrates global hypokinesis. Left ventricular diastolic function could  not be evaluated.   2. Right ventricular systolic function is normal. The right ventricular  size is mildly enlarged. Tricuspid regurgitation signal is inadequate for  assessing PA pressure.   3. The mitral valve is normal in structure. Trivial mitral valve  regurgitation.   4. The aortic valve is normal in structure. Aortic valve regurgitation is  not visualized. No aortic stenosis is present.    Conclusion(s)/Recommendation(s): No evidence of valvular vegetations on  this transthoracic echocardiogram. Would recommend a transesophageal  echocardiogram to exclude infective endocarditis if clinically indicated. )         Anesthesia Quick Evaluation

## 2022-10-19 DIAGNOSIS — R112 Nausea with vomiting, unspecified: Secondary | ICD-10-CM | POA: Diagnosis not present

## 2022-10-19 LAB — GLUCOSE, CAPILLARY
Glucose-Capillary: 253 mg/dL — ABNORMAL HIGH (ref 70–99)
Glucose-Capillary: 222 mg/dL — ABNORMAL HIGH (ref 70–99)
Glucose-Capillary: 222 mg/dL — ABNORMAL HIGH (ref 70–99)
Glucose-Capillary: 301 mg/dL — ABNORMAL HIGH (ref 70–99)

## 2022-10-19 LAB — CBC
HCT: 31.8 % — ABNORMAL LOW (ref 36.0–46.0)
Hemoglobin: 10.6 g/dL — ABNORMAL LOW (ref 12.0–15.0)
MCH: 29.3 pg (ref 26.0–34.0)
MCHC: 33.3 g/dL (ref 30.0–36.0)
MCV: 87.8 fL (ref 80.0–100.0)
Platelets: 267 10*3/uL (ref 150–400)
RBC: 3.62 MIL/uL — ABNORMAL LOW (ref 3.87–5.11)
RDW: 13.8 % (ref 11.5–15.5)
WBC: 14.7 10*3/uL — ABNORMAL HIGH (ref 4.0–10.5)
nRBC: 0 % (ref 0.0–0.2)

## 2022-10-19 LAB — BASIC METABOLIC PANEL
Anion gap: 12 (ref 5–15)
BUN: 12 mg/dL (ref 6–20)
CO2: 23 mmol/L (ref 22–32)
Calcium: 8.6 mg/dL — ABNORMAL LOW (ref 8.9–10.3)
Chloride: 103 mmol/L (ref 98–111)
Creatinine, Ser: 1.34 mg/dL — ABNORMAL HIGH (ref 0.44–1.00)
GFR, Estimated: 48 mL/min — ABNORMAL LOW (ref >60)
Glucose, Bld: 241 mg/dL — ABNORMAL HIGH (ref 70–99)
Potassium: 3.7 mmol/L (ref 3.5–5.1)
Sodium: 138 mmol/L (ref 135–145)

## 2022-10-19 MED ORDER — ADULT MULTIVITAMIN W/MINERALS CH
1.0000 | ORAL_TABLET | Freq: Every day | ORAL | Status: DC
  Administered 2022-10-19 – 2022-10-20 (×2): 1 via ORAL
  Filled 2022-10-19 (×2): qty 1

## 2022-10-19 MED ORDER — PANTOPRAZOLE SODIUM 40 MG PO TBEC
40.0000 mg | DELAYED_RELEASE_TABLET | Freq: Two times a day (BID) | ORAL | Status: DC
  Administered 2022-10-19 – 2022-10-20 (×2): 40 mg via ORAL
  Filled 2022-10-19 (×2): qty 1

## 2022-10-19 MED ORDER — GLUCERNA SHAKE PO LIQD
237.0000 mL | Freq: Three times a day (TID) | ORAL | Status: DC
  Administered 2022-10-20 (×2): 237 mL via ORAL

## 2022-10-19 NOTE — Discharge Instructions (Signed)
Plate Method for Diabetes   Foods with carbohydrates make your blood glucose level go up. The plate method is a simple way to meal plan and control the amount of carbohydrate you eat.          Use the following guidance to build a healthy plate to control carbohydrates. Divide a 9-inch plate into 3 sections, and consider your beverage the 4th section of your meal: Food Group Examples of Foods/Beverages for This Section of your Meal  Section 1: Non-starchy vegetables Fill  of your plate to include non-starchy vegetables Asparagus, broccoli, brussels sprouts, cabbage, carrots, cauliflower, celery, cucumber, green beans, mushrooms, peppers, salad greens, tomatoes, or zucchini.  Section 2: Protein foods Fill  of your plate to include a lean protein Lean meat, poultry, fish, seafood, cheese, eggs, lean deli meat, tofu, beans, lentils, nuts or nut butters.  Section 3: Carbohydrate foods Fill  of your plate to include carbohydrate foods Whole grains, whole wheat bread, brown rice, whole grain pasta, polenta, corn tortillas, fruit, or starchy vegetables (potatoes, green peas, corn, beans, acorn squash, and butternut squash). One cup of milk also counts as a food that contains carbohydrate.  Section 4: Beverage Choose water or a low-calorie drink for your beverage. Unsweetened tea, coffee, or flavored/sparkling water without added sugar.  Image reprinted with permission from The American Diabetes Association.  Copyright 2022 by the American Diabetes Association.   Copyright 2022  Academy of [employer] and Dietetics. All rights reserved     Carbohydrate Counting For People With Diabetes  Foods with carbohydrates make your blood glucose level go up. Learning how to count carbohydrates can help you control your blood glucose levels. First, identify the foods you eat that contain carbohydrates. Then, using the Foods with Carbohydrates chart, determine about how much carbohydrates are in your meals and  snacks. Make sure you are eating foods with fiber, protein, and healthy fat along with your carbohydrate foods.  Foods with Carbohydrates The following table shows carbohydrate foods that have about 15 grams of carbohydrate each. Using measuring cups, spoons, or a food scale when you first begin learning about carbohydrate counting can help you learn about the portion sizes you typically eat.  The following foods have 15 grams carbohydrate each:  Grains 1 slice bread (1 ounce)  1 small tortilla (6-inch size)   large bagel (1 ounce)  1/3 cup pasta or rice (cooked)   hamburger or hot dog bun ( ounce)   cup cooked cereal   to  cup ready-to-eat cereal  2 taco shells (5-inch size) Fruit 1 small fresh fruit ( to 1 cup)   medium banana  17 small grapes (3 ounces)  1 cup melon or berries   cup canned or frozen fruit  2 tablespoons dried fruit (blueberries, cherries, cranberries, raisins)   cup unsweetened fruit juice  Starchy Vegetables  cup cooked beans, peas, corn, potatoes/sweet potatoes   large baked potato (3 ounces)  1 cup acorn or butternut squash  Snack Foods 3 to 6 crackers  8 potato chips or 13 tortilla chips ( ounce to 1 ounce)  3 cups popped popcorn  Dairy 3/4 cup (6 ounces) nonfat plain yogurt, or yogurt with sugar-free sweetener  1 cup milk  1 cup plain rice, soy, coconut or flavored almond milk Sweets and Desserts  cup ice cream or frozen yogurt  1 tablespoon jam, jelly, pancake syrup, table sugar, or honey  2 tablespoons light pancake syrup  1 inch square of frosted cake or  2 inch square of unfrosted cake  2 small cookies (2/3 ounce each) or  large cookie   Sometimes you'll have to estimate carbohydrate amounts if you don't know the exact recipe. One cup of mixed foods like soups can have 1 to 2 carbohydrate servings, while some casseroles might have 2 or more servings of carbohydrate. Foods that have less than 20 calories in each serving can be counted  as "free" foods. Count 1 cup raw vegetables, or  cup cooked non-starchy vegetables as "free" foods. If you eat 3 or more servings at one meal, then count them as 1 carbohydrate serving.   Foods without Carbohydrates  Not all foods contain carbohydrates. Meat, some dairy, fats, non-starchy vegetables, and many beverages don't contain carbohydrate. So when you count carbohydrates, you can generally exclude chicken, pork, beef, fish, seafood, eggs, tofu, cheese, butter, sour cream, avocado, nuts, seeds, olives, mayonnaise, water, black coffee, unsweetened tea, and zero-calorie drinks. Vegetables with no or low carbohydrate include green beans, cauliflower, tomatoes, and onions.  How much carbohydrate should I eat at each meal?  Carbohydrate counting can help you plan your meals and manage your weight. Following are some starting points for carbohydrate intake at each meal. Work with your registered dietitian nutritionist to find the best range that works for your blood glucose and weight.   To Lose Weight To Maintain Weight  Women 2 - 3 carb servings 3 - 4 carb servings  Men 3 - 4 carb servings 4 - 5 carb servings  Checking your blood glucose after meals will help you know if you need to adjust the timing, type, or number of carbohydrate servings in your meal plan. Achieve and keep a healthy body weight by balancing your food intake and physical activity.  Tips How should I plan my meals?  Plan for half the food on your plate to include non-starchy vegetables, like salad greens, broccoli, or carrots. Try to eat 3 to 5 servings of non-starchy vegetables every day. Have a protein food at each meal. Protein foods include chicken, fish, meat, eggs, or beans (note that beans contain carbohydrate). These two food groups (non-starchy vegetables and proteins) are low in carbohydrate. If you fill up your plate with these foods, you will eat less carbohydrate but still fill up your stomach. Try to limit your  carbohydrate portion to  of the plate.   What fats are healthiest to eat?  Diabetes increases risk for heart disease. To help protect your heart, eat more healthy fats, such as olive oil, nuts, and avocado. Eat less saturated fats like butter, cream, and high-fat meats, like bacon and sausage. Avoid trans fats, which are in all foods that list "partially hydrogenated oil" as an ingredient.  What should I drink?  Choose drinks that are not sweetened with sugar. The healthiest choices are water, carbonated or seltzer waters, and tea and coffee without added sugars.  Sweet drinks will make your blood glucose go up very quickly. One serving of soda or energy drink is  cup. It is best to drink these beverages only if your blood glucose is low.  Artificially sweetened, or diet drinks, typically do not increase your blood glucose if they have zero calories in them. Read labels of beverages, as some diet drinks do have carbohydrate and will raise your blood glucose.  Label Reading Tips Read Nutrition Facts labels to find out how many grams of carbohydrate are in a food you want to eat. Don't forget: sometimes serving sizes  you are going to eat, so you may need to calculate how much carbohydrate is in the food you are serving yourself.  ° °Carbohydrate Counting for People with Diabetes Sample 1-Day Menu  °Breakfast ¾ cup yogurt, low fat, low sugar (1 carbohydrate serving)  °½ cup cereal, ready-to-eat, unsweetened (1 carbohydrate serving)  °1 cup strawberries (1 carbohydrate serving)  °¼ cup almonds (½ carbohydrate serving)  °Lunch 1, 5 ounce can chunk light tuna  °2 ounces cheese, low fat cheddar  °6 whole wheat crackers (1 carbohydrate serving)  °1 small apple (1½ carbohydrate servings)  °½ cup carrots (½ carbohydrate serving)  °½ cup snap peas  °1 cup 1% milk (1 carbohydrate serving)   °Evening Meal Stir fry made with: 3 ounces chicken  °1 cup brown rice (3 carbohydrate servings)  °½  cup broccoli (½ carbohydrate serving)  °½ cup green beans  °¼ cup onions  °1 tablespoon olive oil  °2 tablespoons teriyaki sauce (½ carbohydrate serving)  °Evening Snack 1 extra small banana (1 carbohydrate serving)  °1 tablespoon peanut butter  ° °Carbohydrate Counting for People with Diabetes Vegan Sample 1-Day Menu  °Breakfast 1 cup cooked oatmeal (2 carbohydrate servings)  °½ cup blueberries (1 carbohydrate serving)  °2 tablespoons flaxseeds  °1 cup soymilk fortified with calcium and vitamin D  °1 cup coffee  °Lunch 2 slices whole wheat bread (2 carbohydrate servings)  °½ cup baked tofu  °¼ cup lettuce  °2 slices tomato  °2 slices avocado  °½ cup baby carrots (½ carbohydrate serving)  °1 orange (1 carbohydrate serving)  °1 cup soymilk fortified with calcium and vitamin D   °Evening Meal Burrito made with: 1 6-inch corn tortilla (1 carbohydrate serving)  °1 cup refried vegetarian beans (2 carbohydrate servings)  °¼ cup chopped tomatoes  °¼ cup lettuce  °¼ cup salsa  °1/3 cup brown rice (1 carbohydrate serving)  °1 tablespoon olive oil for rice  °½ cup zucchini   °Evening Snack 6 small whole grain crackers (1 carbohydrate serving)  °2 apricots (½ carbohydrate serving)  °¼ cup unsalted peanuts (½ carbohydrate serving)   ° °Carbohydrate Counting for People with Diabetes Vegetarian (Lacto-Ovo) Sample 1-Day Menu  °Breakfast 1 cup cooked oatmeal (2 carbohydrate servings)  °½ cup blueberries (1 carbohydrate serving)  °2 tablespoons flaxseeds  °1 egg  °1 cup 1% milk (1 carbohydrate serving)  °1 cup coffee  °Lunch 2 slices whole wheat bread (2 carbohydrate servings)  °2 ounces low-fat cheese  °¼ cup lettuce  °2 slices tomato  °2 slices avocado  °½ cup baby carrots (½ carbohydrate serving)  °1 orange (1 carbohydrate serving)  °1 cup unsweetened tea  °Evening Meal Burrito made with: 1 6-inch corn tortilla (1 carbohydrate serving)  °½ cup refried vegetarian beans (1 carbohydrate serving)  °¼ cup tomatoes  °¼ cup lettuce  °¼  cup salsa  °1/3 cup brown rice (1 carbohydrate serving)  °1 tablespoon olive oil for rice  °½ cup zucchini  °1 cup 1% milk (1 carbohydrate serving)  °Evening Snack 6 small whole grain crackers (1 carbohydrate serving)  °2 apricots (½ carbohydrate serving)  °¼ cup unsalted peanuts (½ carbohydrate serving)   ° °Copyright 2020 © Academy of Nutrition and Dietetics. All rights reserved. ° °Using Nutrition Labels: Carbohydrate ° °Serving Size  °Look at the serving size. All the information on the label is based on this portion. °Servings Per Container  °The number of servings contained in the package. °Guidelines for Carbohydrate  °Look at   contained in the package. Guidelines for Carbohydrate  Look at the total grams of carbohydrate in the serving size.  1 carbohydrate choice = 15 grams of carbohydrate. Range of Carbohydrate Grams Per Choice  Carbohydrate Grams/Choice Carbohydrate Choices  6-10   11-20 1  21-25 1  26-35 2  36-40 2  41-50 3  51-55 3  56-65 4  66-70 4  71-80 5    Copyright 2020  Academy of Nutrition and Dietetics. All rights reserved.     Gastroparesis Nutrition Therapy  Gastroparesis means that your stomach empties very slowly. This happens when the nerves to your stomach are damaged or do not work properly. This can cause bloating, stomach discomfort or pain, feeling full after eating only a small amount of food, nausea, or vomiting. If you have diabetes in addition to gastroparesis, it is important to control your blood glucose. This will help the stomach empty.  Tips Following these tips may help your stomach empty faster:  Eat small, frequent meals (4 to 6 times per day).  Do not eat solid foods that are high in fat and do not add too much fat to foods. See the Foods Not Recommended table for foods that are high fat.  High-fat solid foods may delay the emptying of your stomach.  Liquids that contain fat, such as milkshakes, may be tolerated and can provide needed calories. Do not eat foods high in  fiber. Do not take fiber supplements or fiber bulking agents for constipation.  Do not eat foods that increase acid reflux:  Acidic, spicy, fried and greasy foods  Caffeine  Mint Do not drink alcohol or smoke  Do not drink carbonated beverages, as they increase bloating.  Chew foods well before swallowing. Solid foods in the stomach do not empty well. If you have difficulty tolerating solid foods, ground foods may be better.  If symptoms are severe, semi-solid foods or liquids may need to be your main food sources. Choose liquid nutritional supplements that have less than or equal to 2 grams fiber per serving.  Sit upright while eating and sit upright or walk after meals. Do not lie down for 3 to 4 hours after eating to avoid reflux or regurgitation.  If you wish to nap during the day, nap first and then eat.  Drinking fluids at meals can take up room in your stomach, and you might not get enough calories. At every meal, first eat a grain food and a protein food or dairy product if your body can tolerate it. Drink fluids with calories. It may be better to delay fluids until after the meal and drink more between meals.   Foods Recommended Food Group Foods Recommended   Grains Choose grain foods with less than 2 grams of fiber per serving; these will be made with white flour Crackers: saltines or graham crackers Cold cereal: puffed rice Cream of rice or wheat Grits (fine ground) Gluten free low fiber foods Pretzels White bread, toasted White rice, cook until very soft  Protein Foods Lean meat and poultry: well-cooked, very tender, moist, and chopped fine Fish: tuna, salmon, or white fish Egg whites, scrambled Peanut butter (limit to 1 tablespoon at a time)  Dairy Milk*, drink 2% if tolerated to get more nutrients or lactose-free 2% milk Fortified non-dairy milks: almond, cashew, coconut, or rice (be aware that these options are not good sources of protein so you will need to eat an  additional protein food) Fortified pea milk or soymilk (may  cause gas and bloating for some) Instant breakfast* (pre-made lactose-free is sold in bottles) Milkshakes* (try blending in  to  cup canned fruit) Ice cream* (low-fat may be tolerated better; use in milkshakes to increase calories) Frozen yogurt Yogurt* Puddings and custard* Sherbet Liquid nutritional supplements with less than or equal to 2 grams fiber per 1 cup serving *Use lactose-free varieties to reduce gas and bloating  Vegetables Canned and well-cooked vegetables without seeds, skins or hulls Carrots, cooked Mashed potatoes (white, red or yellow) Sweet potato  Fruit Canned, soft and well-cooked fruits without seeds, skins or membranes Applesauce Banana, mashed may be tolerated better Diced peaches/pears fruit cups in juice Melon, very soft, cut into small pieces Fruit nectar juices  Oils When possible choose oils rather than solid fats Canola or olive oil Margarine  Other Clear soup Gelatin Popsicles   Foods Not Recommended Food Group Foods Not Recommended   Grains Bran Grains foods with 2 or more grams of fiber per serving: barley, brown rice, kasha, quinoa Popcorn Whole grain and high-fiber cereals, including oats or granola Whole grain bread or pasta  Protein Foods Fried meats, poultry or fish Sausage, bacon or hot dogs Seafood Tough meat, meat with gristle: steak, roast beef or pork chops Beans, peas or lentils Nuts  Dairy Cheese slices Liquid nutritional supplements that have more than 2 grams fiber per serving Pea milk, soymilk (may increase gas and bloating)  Vegetables Raw or undercooked vegetables Alfalfa, asparagus, bean sprouts, broccoli, brussels sprouts, cabbage, cauliflower, corn, green peas or any other kind of peas, lima beans, mushrooms, okra, onions, parsnips, peppers, pickles, potato skins, or spinach  Fruit Fresh fruit except for the ones in the foods recommended table Acidic fruit  and juices: oranges/orange juice, grapefruit/grapefruit juice, tomatoes/tomato juice Avocado Berries Coconut Dried fruit Fruit skin Mandarin oranges Pineapple  Oils Fried foods of any type  Other Coffee Olives or pickles Pizza Salsa Sushi   Gastroparesis Sample 1-Day Menu  Breakfast 1 slice white toast (1 carbohydrate serving)  1 teaspoon margarine, soft, tub   cup egg substitute  1 cup peach nectar (2 carbohydrate servings)  Morning Snack Smoothie made with:  small banana (1 carbohydrate serving)  1/3 cup [place] strawberry yogurt ( carbohydrate serving)  1 cup 2% milk (1 carbohydrate serving)  Lunch 2 ounces canned chicken  1 teaspoon mayonnaise  9 saltine crackers (1 carbohydrate servings)   cup applesauce (1 carbohydrate serving)  Afternoon Snack 1 slice white toast (1 carbohydrate serving)  1 tablespoon smooth peanut butter  Evening Meal 2 ounces baked fish   cup mashed potatoes (1 carbohydrate serving)  1 teaspoon olive oil  1 cup 2% milk (1 carbohydrate serving)  Evening Snack 1 packet instant breakfast (1 carbohydrate servings)  1 cup 2% milk (1 carbohydrate serving)   Gastroparesis Vegetarian (Lacto-Ovo) Sample 1-Day Menu  Breakfast  cup cooked farina (1 carbohydrate serving)   cup egg substitute  2 teaspoons olive oil   cup peach nectar (2 carbohydrate servings)   cup 2% milk ( carbohydrate serving)  Morning Snack 1 slice white toast (1 carbohydrate serving)  1 tablespoon smooth peanut butter  Lunch  cup vegetable soup (1 carbohydrate serving)  9 saltine crackers (1 carbohydrate serving)   cup applesauce (1 carbohydrate serving)   cup 2% milk ( carbohydrate serving)  Afternoon Snack 6 ounces plain yogurt (1 carbohydrate serving)   small banana (1 carbohydrate servings)  Evening Meal  cup baked tofu  2/3 cup white rice (2 carbohydrate  servings)  2 teaspoons olive oil   cup 2% milk ( carbohydrate serving)  Evening Snack 1 packet instant  breakfast (1 carbohydrate servings)  1 cup 2% milk (1 carbohydrate serving)   Gastroparesis Vegan Sample 1-Day Menu  Breakfast  cup cooked farina (1 carbohydrate serving)  1/3 cup tofu scramble  2 teaspoons olive oil   cup peach nectar (2 carbohydrate servings)   cup almond milk fortified with calcium, vitamin B12, and vitamin D  Morning Snack 1 slice white toast (1 carbohydrate serving)  1 tablespoon smooth peanut butter  Lunch  cup vegetable soup (1 carbohydrate serving)  9 saltine crackers (1 carbohydrate serving)   cup applesauce (1 carbohydrate serving)  Afternoon Snack 6 ounces plain soy yogurt (1 carbohydrate servings)   small banana (1 carbohydrate serving)  Evening Meal  cup baked tofu  2/3 cup white rice (2 carbohydrate servings)  2 teaspoons olive oil   cup almond milk fortified with calcium, vitamin B12, and vitamin D  Evening Snack  scoop soy protein powder ( carbohydrate serving)   cup almond milk fortified with calcium, vitamin B12, and vitamin D  Copyright 2020  Academy of Nutrition and Dietetics. All rights reserved.

## 2022-10-19 NOTE — Inpatient Diabetes Management (Signed)
Inpatient Diabetes Program Recommendations  AACE/ADA: New Consensus Statement on Inpatient Glycemic Control (2015)  Target Ranges:  Prepandial:   less than 140 mg/dL      Peak postprandial:   less than 180 mg/dL (1-2 hours)      Critically ill patients:  140 - 180 mg/dL   Lab Results  Component Value Date   GLUCAP 222 (H) 10/19/2022   HGBA1C 7.4 (H) 05/23/2022    Review of Glycemic Control  Latest Reference Range & Units 10/18/22 11:27 10/18/22 16:21 10/18/22 21:32 10/19/22 07:51 10/19/22 11:33  Glucose-Capillary 70 - 99 mg/dL 299 (H) 165 (H) 184 (H) 253 (H) 222 (H)   Diabetes history: Type 2 DM Outpatient Diabetes medications: U-500 100 units TID, Metformin 1000 mg BID, Ozempic 2 mg qwk, Jardiance 10 mg QD Current orders for Inpatient glycemic control:  Novolog 0-15 units TID & HS Semglee 10 units Daily  Inpatient Diabetes Program Recommendations:    Consider increasing Semglee to 15 units QD.   Thanks, Tama Headings RN, MSN, BC-ADM Inpatient Diabetes Coordinator Team Pager [phone] (8a-5p)

## 2022-10-19 NOTE — Progress Notes (Signed)
Initial Nutrition Assessment  DOCUMENTATION CODES:   Morbid obesity  INTERVENTION:   Diet liberalization from heart healthy/carb modified to a carb modified Glucerna Shake po TID, each supplement provides 220 kcal and 10 grams of protein MVI with minerals daily  Recommend increasing bowel regimen given last reported BM ~1 week ago "Plate Method," "Carbohydrate Counting for People with Diabetes" and "Gastroparesis Nutrition Therapy" handouts all added to AVS to help with balanced snack options and education to continue to  manage chronic conditions.   NUTRITION DIAGNOSIS:   Inadequate oral intake related to decreased appetite as evidenced by per patient/family report.  GOAL:   Patient will meet greater than or equal to 90% of their needs  MONITOR:   PO intake, Supplement acceptance, Labs, Weight trends  REASON FOR ASSESSMENT:   Malnutrition Screening Tool    ASSESSMENT:   Pt admitted with intractable n/v. PMH significant for DM, gastroparesis, PE, CVA, obesity, HTN, HLD, Bell's palsy, lupus, Sjogren's, anemia, depression, CHF.   Plans by GI was for EGD however pt declined.   Met with pt at bedside. She endorses having a headache and was drowsy during visit.  She reports that she has been experiencing intractable n/v since last Wednesday. Her vomiting has improved but her nausea is still present but minimal.   Prior to a week ago, and her onset of symptoms, she was eating 1 meal per day which was usually dinner. She states that she does not eat much d/t having no appetite. She lives with her niece and sister. Her sister gets home from work in the evening and they will usually go out to eat at places like [employer] or a soul [employer] where she will get rice with gravy, mac and cheese and a meat.   She states that her mouth and throat are usually dry 2/2 Sjogren's which causes her to remain thirsty. She endorses drinking significant amount more in beverages (water and  minute maid) than food.   She has been on a clear liquid diet during admission. Observed 100% of clear liquid tray consumed this morning. Diet was advanced to full liquids and then HH/CHO mod as pt wanting to try solids.   She recalls managing her diabetes by checking her blood sugars (usually 120-150), watching her calories and checking food labels.   Pt endorses no BM within the last week. She was given miralax today for this however has not helped up to this point.   She states that she has experienced some weight loss, however then mentions she was weighed at the eye doctor on the 11th and weighed 310 lbs. At home a couple days later she weight 318 lbs.   Reviewed weight history. Pt's weights were variable early 2023 however since 04/27/22 to this admission, her weight has declined from 156.8 kg to 140.6 kg. This is a weight loss of 10.3% which is concerning.   Medications: SSI 0-15 units TID, SSI 0-5 units qhs, semglee 10 units daily, zofran, miralax, torsemide  Labs: Cr 1.34, GFR 48, HgbA1c 7.4% (05/2022), CBG's 165-299 x24 hours  NUTRITION - FOCUSED PHYSICAL EXAM:  Flowsheet Row Most Recent Value  Orbital Region No depletion  Upper Arm Region No depletion  Thoracic and Lumbar Region No depletion  Buccal Region No depletion  Temple Region No depletion  Clavicle Bone Region No depletion  Clavicle and Acromion Bone Region No depletion  Scapular Bone Region No depletion  Dorsal Hand No depletion  Patellar Region No depletion  Anterior Thigh  Region No depletion  Posterior Calf Region No depletion  Edema (RD Assessment) Mild  [generalized]  Hair Other (Comment)  [pt reports intermittently falling out]  Eyes Other (Comment)  [pale conjunctiva]  Mouth Other (Comment)  [tongue brownish, pt reports this is normal]  Skin Reviewed  Nails Reviewed       Diet Order:   Diet Order             Diet Carb Modified Fluid consistency: Thin; Room service appropriate? Yes  Diet effective  now                   EDUCATION NEEDS:   Education needs have been addressed  Skin:  Skin Assessment: Reviewed RN Assessment  Last BM:  PTA  Height:   Ht Readings from Last 1 Encounters:  10/18/22 5\' 8"  (1.727 m)    Weight:   Wt Readings from Last 1 Encounters:  10/18/22 (!) 140.6 kg    Ideal Body Weight:  63.6 kg  BMI:  Body mass index is 47.14 kg/m.  Estimated Nutritional Needs:   Kcal:  1700-1900  Protein:  85-100g  Fluid:  >/=1.7L  Clayborne Dana, RDN, LDN Clinical Nutrition

## 2022-10-19 NOTE — Progress Notes (Signed)
PROGRESS NOTE [name]  [id number] DOB: [birth year] DOA: 10/15/2022 PCP: Bartholome Bill, MD  Brief Narrative/Hospital Course: Per HPI: 52 y.o.f w/ diabetes with gastroparesis, history of PE, history of CVA, OHS, obesity, hypertension, hyperlipidemia, Bell's palsy, lupus, Sjogren's, anemia, depression, chronic systolic CHF presented with recurrent nausea and vomiting and abdominal pain on 10/15/22. She has had ongoing nausea and vomiting for the past several days.She has had 3 ED visits in the last 3 days.  3/15 she was seen and received a dose of droperidol with improving symptoms and was discharged home.  She was doing okay initially and then her symptoms returned overnight aso reports some ongoing chest pain that worsened with  tenderness to palpation of chest wall. She has been unable to keep down her medications for the past couple days.    ED Course: Vital signs in the ED significant for blood pressure in the [id number] systolic, respiratory rate in the teens to 20s.  Lab workup included CMP with bicarb 18, creatinine stable 1.05, glucose 226, calcium 8.7, protein 6.3, albumin 3.1.  CBC with mild leukocytosis to 14.4, hemoglobin stable at 10.6.  Lipase negative, troponin negative x 2.  Urinalysis with glucose, ketones, leukocytes, bacteria.  Urine culture ordered Chest x-ray and CT abdomen pelvis 3/15- without acute abnormality.  Chest x-ray trepeat showed cardiomegaly with mild interstitial opacities possibly representing edema. EKG-showed QTc prolonged at 571.  Patient received Dilaudid, droperidol, Zofran, liter of IV fluids in the ED    Subjective:  Patient seen and examined.  BP soft earlier but on my evaluation was running in 110, overall feeling better would like to advance diet Still on clear liquid diet Assessment and Plan: Principal Problem:   Intractable nausea and vomiting Active Problems:   SJOGREN'S SYNDROME   Chronic diastolic CHF (congestive heart failure) (HCC)    Morbid obesity (HCC)   Generalized abdominal pain   Diabetic gastroparesis (HCC)   HLD (hyperlipidemia)   Type 2 diabetes mellitus with hyperlipidemia (HCC)   Obesity hypoventilation syndrome (HCC)   Essential hypertension   History of pulmonary embolism   Normocytic anemia   History of CVA (cerebrovascular accident)   Major depressive disorder with single episode, in partial remission ([place])   SLE (systemic lupus erythematosus related syndrome) (HCC)   Intractable nausea vomiting recurrent problem dating back to 2014, has had 3 EGDs since 2014 History of gastroparesis: Gastric emptying study in 2021 was normal per GI. CT abdomen unremarkable, QTc stable on EKG,Patient not tolerating Reglan in the past.am cortisol  5.1, tsh normal.-Cosyntropin test is normal rule out adrenal insufficiency.  GI following closely, EGD canceled due to consent issues/patient did not want to proceed. GI following-advised to advance diet no plan for further EGD given low yield.  Changed to full liquid and advance as tolerated continue to minimize narcotics continue pain control PPI Zofran and symptomatic management. Suspect polypharmacy is the major reason for issues as per GI and will need to follow-up with [place] team on dc  Leukocytosis likely reactive/steroid effect/? but downtrending,afebrile, procalcitonin 0.18- cont to monitor Recent Labs  Lab 10/16/22 0314 10/16/22 1000 10/17/22 0412 10/18/22 0453 10/19/22 0249  WBC 19.6* 18.1* 16.8* 13.8* 14.7*     Diabetes mellitus blood sugar poorly controlled-DM control following PTA on U-500 100 units 3 times daily metformin or Ozempic Jardiance, added Semglee 10 U. Monitor. Recent Labs  Lab 10/18/22 0730 10/18/22 1127 10/18/22 1621 10/18/22 2132 10/19/22 0751  GLUCAP 221* 299* 165*  184* 253*   History of PE continue home Eliquis.   Chronic combined systolic and diastolic CHF: Follows with Dr. Einar Gip, recent EF 2022 showed EF of 30-35% with  G1 DD and severely reduced RV function.outpatient cardiology note menionted improved EF in 2023 but cannont locate TTE.  Currently appears euvolemic no shortness of breath although chest x-ray with cardiomegaly mild changes that could be consistent with edema. Creat is holding at 1.3, hold Entresto, cont torsemide at 20mg  [place]ont Coreg, holding Aldactone and BiDil for now. Monitor fluid balance Daily weight as below.Net IO Since Admission: -419.32 mL [10/19/22 1034]  Filed Weights   10/15/22 1013 10/18/22 0809  Weight: (!) 149.7 kg (!) 140.6 kg    Hypertension: BP soft at times- cont meds with holding parameters.   Hyperlipidemia: Continue rosuvastatin History of stroke:Continue home rosuvastatin, Eliquis Hypokalemia replaced  Lupus Sjogren's: No issues currently continue home hydroxychloroquine, Lyrica and Is on belimumab injections outpatient   Anemia appears chronic:Stable hb will monitor Recent Labs  Lab 10/16/22 0314 10/16/22 1000 10/17/22 0412 10/18/22 0453 10/19/22 0249  HGB 10.8* 10.7* 10.9* 10.5* 10.6*  HCT 33.7* 31.9* 33.3* 31.4* 31.8*   Anxiety/depression -mood is stable, continue Seroquel ibuprofen, holding PRN Xanax while getting Ativan Morbid Obesity /W [id number] Body mass index is 47.14 kg/m. : Will benefit with PCP follow-up, weight loss  healthy lifestyle and outpatient sleep evaluation.  DVT prophylaxis: apixaban (ELIQUIS) tablet 2.5 mg Start: 10/18/22 2200 Code Status:   Code Status: Full Code Family Communication: plan of care discussed with patient at bedside. Patient status is: Inpatient because of intractable nausea vomiting Level of care: Telemetry Medical   Dispo: The patient is from: home            Anticipated disposition: Anticipate discharge home tomorrow if tolerating diet  Objective: Vitals last 24 hrs: Vitals:   10/18/22 1451 10/18/22 1932 10/18/22 2034 10/19/22 0753  BP: 101/72 96/62  (!) 95/40  Pulse: 75 79  82  Resp:  18  17  Temp: 97.8  F (36.6 C) (!) 97.3 F (36.3 C)    TempSrc: Oral Oral    SpO2: 98% 100% 100% 98%  Weight:      Height:       Weight change:   Physical Examination: General exam: alert awake, oriented, obese HEENT:Oral mucosa moist, Ear/Nose WNL grossly Respiratory system: Bilaterally diminished BS, no use of accessory muscle Cardiovascular system: S1 & S2 +, No JVD. Gastrointestinal system: Abdomen soft, obese, minimally tender epigastrium  Nervous System: Alert, awake, moving extremities, shefollows commands. Extremities: LE edema neg,distal peripheral pulses palpable.  Skin: No rashes,no icterus. MSK: Normal muscle bulk,tone, power   Medications reviewed:  Scheduled Meds:  acetaminophen  1,000 mg Oral Q6H   apixaban  2.5 mg Oral BID   buPROPion  300 mg Oral Daily   carvedilol  37.5 mg Oral BID WC   cycloSPORINE  1 drop Both Eyes TID   hydroxychloroquine  200 mg Oral BID   insulin aspart  0-15 Units Subcutaneous TID WC   insulin aspart  0-5 Units Subcutaneous QHS   insulin glargine-yfgn  10 Units Subcutaneous Daily   levothyroxine  50 mcg Oral Q0600   mometasone-formoterol  2 puff Inhalation BID   ondansetron (ZOFRAN) IV  4 mg Intravenous Q6H WA   pantoprazole (PROTONIX) IV  40 mg Intravenous Q12H   polyethylene glycol  17 g Oral Daily   pregabalin  75 mg Oral TID   QUEtiapine  300 mg Oral  QHS   rosuvastatin  20 mg Oral Daily   sodium chloride flush  3 mL Intravenous Q12H   topiramate  75 mg Oral QHS   torsemide  20 mg Oral QHS   Continuous Infusions:    Diet Order             Diet full liquid Room service appropriate? Yes; Fluid consistency: Thin  Diet effective now                 No intake or output data in the 24 hours ending 10/19/22 1034  Net IO Since Admission: -419.32 mL [10/19/22 1034]  Wt Readings from Last 3 Encounters:  10/18/22 (!) 140.6 kg  10/14/22 (!) 149.7 kg  10/13/22 (!) 149.7 kg     Unresulted Labs (From admission, onward)     Start      Ordered   10/17/22 [id number]  Basic metabolic panel  Daily,   R      10/16/22 0948   10/17/22 0500  CBC  Daily,   R      10/16/22 1304          Data Reviewed: I have personally reviewed following labs and imaging studies CBC: Recent Labs  Lab 10/15/22 1118 10/16/22 0314 10/16/22 1000 10/17/22 0412 10/18/22 0453 10/19/22 0249  WBC 14.4* 19.6* 18.1* 16.8* 13.8* 14.7*  NEUTROABS 11.5*  --   --   --   --   --   HGB 10.6* 10.8* 10.7* 10.9* 10.5* 10.6*  HCT 33.4* 33.7* 31.9* 33.3* 31.4* 31.8*  MCV 92.3 90.1 88.6 89.0 88.0 87.8  PLT 237 237 245 244 221 [id number]    Basic Metabolic Panel: Recent Labs  Lab 10/15/22 1118 10/16/22 0314 10/17/22 0412 10/18/22 0453 10/19/22 0249  NA 137 138 136 138 138  K 3.8 4.0 3.3* 3.5 3.7  CL 107 108 103 106 103  CO2 18* 18* 21* 20* 23  GLUCOSE 226* 169* 157* 198* 241*  BUN 16 13 12 16 12   CREATININE 0.93 0.79 1.03* 1.31* 1.34*  CALCIUM 8.7* 8.9 8.6* 8.3* 8.6*    GFR: Estimated Creatinine Clearance: 73.3 mL/min (A) (by C-G formula based on SCr of 1.34 mg/dL (H)). Liver Function Tests: Recent Labs  Lab 10/14/22 0004 10/15/22 1118 10/16/22 0314 10/17/22 0412  AST 15 13* 14* 10*  ALT 10 9 10 8   ALKPHOS 101 82 89 85  BILITOT 1.1 0.8 0.9 0.8  PROT 7.2 6.3* 6.4* 6.0*  ALBUMIN 3.7 3.1* 3.1* 2.8*    Recent Labs  Lab 10/14/22 0004 10/15/22 1118  LIPASE 29 39   CBG: Recent Labs  Lab 10/18/22 0730 10/18/22 1127 10/18/22 1621 10/18/22 2132 10/19/22 0751  GLUCAP 221* 299* 165* 184* 253*    Recent Results (from the past 240 hour(s))  Urine Culture     Status: Abnormal   Collection Time: 10/15/22 11:53 AM   Specimen: Urine, Clean Catch  Result Value Ref Range Status   Specimen Description URINE, CLEAN CATCH  Final   Special Requests NONE  Final   Culture (A)  Final    60,000 COLONIES/mL STREPTOCOCCUS AGALACTIAE TESTING AGAINST S. AGALACTIAE NOT ROUTINELY PERFORMED DUE TO PREDICTABILITY OF AMP/PEN/VAN SUSCEPTIBILITY. Performed at  [facility], [place] [address], [place], [place] [address]    Report Status 10/17/2022 FINAL  Final    Antimicrobials: Anti-infectives (From admission, onward)    Start     Dose/Rate Route Frequency Ordered Stop   10/15/22 2200  hydroxychloroquine (PLAQUENIL) tablet  200 mg        200 mg Oral 2 times daily 10/15/22 1609         LOS: 3 days   Antonieta Pert, MD [facility]  10/19/2022, 10:34 AM

## 2022-10-19 NOTE — Plan of Care (Signed)
  Problem: Clinical Measurements: Goal: Cardiovascular complication will be avoided Outcome: Progressing   Problem: Nutrition: Goal: Adequate nutrition will be maintained Outcome: Progressing   Problem: Coping: Goal: Level of anxiety will decrease Outcome: Progressing   

## 2022-10-20 DIAGNOSIS — R112 Nausea with vomiting, unspecified: Secondary | ICD-10-CM | POA: Diagnosis not present

## 2022-10-20 LAB — CBC
HCT: 31.4 % — ABNORMAL LOW (ref 36.0–46.0)
Hemoglobin: 10.3 g/dL — ABNORMAL LOW (ref 12.0–15.0)
MCH: 29.3 pg (ref 26.0–34.0)
MCHC: 32.8 g/dL (ref 30.0–36.0)
MCV: 89.2 fL (ref 80.0–100.0)
Platelets: 264 10*3/uL (ref 150–400)
RBC: 3.52 MIL/uL — ABNORMAL LOW (ref 3.87–5.11)
RDW: 13.9 % (ref 11.5–15.5)
WBC: 13 10*3/uL — ABNORMAL HIGH (ref 4.0–10.5)
nRBC: 0 % (ref 0.0–0.2)

## 2022-10-20 LAB — BASIC METABOLIC PANEL
Anion gap: 8 (ref 5–15)
BUN: 14 mg/dL (ref 6–20)
CO2: 23 mmol/L (ref 22–32)
Calcium: 8.3 mg/dL — ABNORMAL LOW (ref 8.9–10.3)
Chloride: 105 mmol/L (ref 98–111)
Creatinine, Ser: 1.33 mg/dL — ABNORMAL HIGH (ref 0.44–1.00)
GFR, Estimated: 48 mL/min — ABNORMAL LOW (ref >60)
Glucose, Bld: 225 mg/dL — ABNORMAL HIGH (ref 70–99)
Potassium: 3.2 mmol/L — ABNORMAL LOW (ref 3.5–5.1)
Sodium: 136 mmol/L (ref 135–145)

## 2022-10-20 LAB — GLUCOSE, CAPILLARY
Glucose-Capillary: 220 mg/dL — ABNORMAL HIGH (ref 70–99)
Glucose-Capillary: 212 mg/dL — ABNORMAL HIGH (ref 70–99)
Glucose-Capillary: 236 mg/dL — ABNORMAL HIGH (ref 70–99)
Glucose-Capillary: 282 mg/dL — ABNORMAL HIGH (ref 70–99)

## 2022-10-20 MED ORDER — INSULIN GLARGINE-YFGN 100 UNIT/ML ~~LOC~~ SOLN: 15.0000 [IU] | Freq: Every day | SUBCUTANEOUS | Status: DC

## 2022-10-20 MED ORDER — POTASSIUM CHLORIDE CRYS ER 20 MEQ PO TBCR
40.0000 meq | EXTENDED_RELEASE_TABLET | Freq: Once | ORAL | Status: AC
  Administered 2022-10-20: 40 meq via ORAL
  Filled 2022-10-20: qty 2

## 2022-10-20 MED ORDER — POTASSIUM CHLORIDE 10 MEQ/100ML IV SOLN
10.0000 meq | INTRAVENOUS | Status: AC
  Administered 2022-10-20 (×2): 10 meq via INTRAVENOUS
  Filled 2022-10-20 (×2): qty 100

## 2022-10-20 MED ORDER — INSULIN GLARGINE-YFGN 100 UNIT/ML ~~LOC~~ SOLN
5.0000 [IU] | Freq: Once | SUBCUTANEOUS | Status: AC
  Administered 2022-10-20: 5 [IU] via SUBCUTANEOUS
  Filled 2022-10-20: qty 0.05

## 2022-10-20 NOTE — Progress Notes (Signed)
AM labs refused this morning; provider made aware.

## 2022-10-20 NOTE — Discharge Summary (Signed)
Physician Discharge Summary  ALFREDO VOLLRATH [id number] DOB: 08-14-1970 DOA: 10/15/2022  PCP: Bartholome Bill, MD  Admit date: 10/15/2022 Discharge date: 10/20/2022 Recommendations for Outpatient Follow-up:  Follow up with PCP in 1 weeks-call for appointment Please obtain BMP/CBC in one week Follow-up with your GI team at [facility] in a week or 2 Please call Please have patient follow-up in next 1 to 2-week  Discharge Dispo: Home Discharge Condition: Stable Code Status:   Code Status: Full Code Diet recommendation:  Diet Order             Diet Carb Modified Fluid consistency: Thin; Room service appropriate? Yes  Diet effective now                    Brief/Interim Summary: Per HPI: 52 y.o.f w/ diabetes with gastroparesis, history of PE, history of CVA, OHS, obesity, hypertension, hyperlipidemia, Bell's palsy, lupus, Sjogren's, anemia, depression, chronic systolic CHF presented with recurrent nausea and vomiting and abdominal pain on 10/15/22. She has had ongoing nausea and vomiting for the past several days.She has had 3 ED visits in the last 3 days.  3/15 she was seen and received a dose of droperidol with improving symptoms and was discharged home.  She was doing okay initially and then her symptoms returned overnight aso reports some ongoing chest pain that worsened with  tenderness to palpation of chest wall. She has been unable to keep down her medications for the past couple days.    ED Course: Vital signs in the ED significant for blood pressure in the [id number] systolic, respiratory rate in the teens to 20s.  Lab workup included CMP with bicarb 18, creatinine stable 1.05, glucose 226, calcium 8.7, protein 6.3, albumin 3.1.  CBC with mild leukocytosis to 14.4, hemoglobin stable at 10.6.  Lipase negative, troponin negative x 2.  Urinalysis with glucose, ketones, leukocytes, bacteria.  Urine culture ordered Chest x-ray and CT abdomen pelvis 3/15- without acute abnormality.   Chest x-ray trepeat showed cardiomegaly with mild interstitial opacities possibly representing edema. EKG-showed QTc prolonged at 571.  Patient received Dilaudid, droperidol, Zofran, liter of IV fluids in the ED Patient was seen by GI EGD attempted but patient refused/consent issues and subsequently conservatively manage diet was slowly advanced at this time she is tolerating diet well no nausea or vomiting.  Likely multifactorial with polypharmacy. She will be discharged home today after tolerating diet.  She will Need to follow-up with her GI team at [facility] and her cardiology team.   Discharge Diagnoses:  Principal Problem:   Intractable nausea and vomiting Active Problems:   SJOGREN'S SYNDROME   Chronic diastolic CHF (congestive heart failure) (HCC)   Morbid obesity ([place])   Generalized abdominal pain   Diabetic gastroparesis ([place])   HLD (hyperlipidemia)   Type 2 diabetes mellitus with hyperlipidemia (HCC)   Obesity hypoventilation syndrome ([place])   Essential hypertension   History of pulmonary embolism   Normocytic anemia   History of CVA (cerebrovascular accident)   Major depressive disorder with single episode, in partial remission ([place])   SLE (systemic lupus erythematosus related syndrome) (HCC)  Intractable nausea vomiting recurrent problem dating back to 2014, has had 3 EGDs since 2014 History of gastroparesis: Gastric emptying study in 2021 was normal per GI. CT abdomen unremarkable, QTc stable on EKG,Patient not tolerating Reglan in the past.am cortisol  5.1, tsh normal.-Cosyntropin test is normal rule out adrenal insufficiency.  GI following closely, EGD canceled due to  consent issues/patient did not want to proceed. GI following-advised to advance diet no plan for further EGD given low yield.  Diet was slowly advanced at this time she is tolerating diet continue symptomatic management antiemetics PPI. Suspect polypharmacy is the major reason for issues as per GI and will  need to follow-up with [place] team on dc. > if her symptoms are related to semaglutide- patient provided dietary education and advised to see her pcp  Leukocytosis likely reactive/steroid effect/? but downtrending,afebrile, procalcitonin 0.18- cont to monitor Recent Labs  Lab 10/16/22 1000 10/17/22 0412 10/18/22 0453 10/19/22 0249 10/20/22 0231  WBC 18.1* 16.8* 13.8* 14.7* 13.0*    Diabetes mellitus blood sugar poorly controlled-DM control following PTA on U-500 100 units 3 times daily metformin or Ozempic Jardiance, added Semglee 10 U. Monitor. Recent Labs  Lab 10/19/22 1559 10/19/22 2140 10/20/22 0743 10/20/22 0948 10/20/22 1207  GLUCAP 222* 301* 220* 212* 236*  History of PE continue home Eliquis.   Chronic combined systolic and diastolic CHF: Follows with Dr. Einar Gip, recent EF 2022 showed EF of 30-35% with G1 DD and severely reduced RV function.outpatient cardiology note menionted improved EF in 2023 but cannont locate TTE.  Currently appears euvolemic no shortness of breath although chest x-ray with cardiomegaly mild changes that could be consistent with edema. Creat is holding at 1.3> Delene Loll was held here continued on  torsemide at 20mg  Cont Coreg, herdiAldactone and BiDil for now>I discussed w/ Dr Einar Gip who feels sine her Leory Plowman is improved it is ok to hold and he will reassess in outpatient visit. .Net IO Since Admission: 340.68 mL [10/20/22 1456]  Filed Weights   10/15/22 1013 10/18/22 0809  Weight: (!) 149.7 kg (!) 140.6 kg    Hypertension: BP currently well-controlled and can resume home meds upon discharge with follow-up with PCP and per cardiology  Hyperlipidemia: Continue rosuvastatin History of stroke:Continue home rosuvastatin, Eliquis Hypokalemia replaced CONT to fu to monitor.  Lupus Sjogren's: No issues currently continue home hydroxychloroquine, Lyrica and Is on belimumab injections outpatient   Anemia appears chronic:Stable hb will monitor Recent  Labs  Lab 10/16/22 1000 10/17/22 0412 10/18/22 0453 10/19/22 0249 10/20/22 0231  HGB 10.7* 10.9* 10.5* 10.6* 10.3*  HCT 31.9* 33.3* 31.4* 31.8* 31.4*  Anxiety/depression -mood is stable, continue Seroquel ibuprofen, holding PRN Xanax while getting Ativan Morbid Obesity /W [id number] Body mass index is 47.14 kg/m. : Will benefit with PCP follow-up, weight loss  healthy lifestyle and outpatient sleep evaluation.  Consults: GI Subjective: Alert oriented resting comfortably tolerating diet  Discharge Exam: Vitals:   10/20/22 0820 10/20/22 1403  BP: 122/68 111/65  Pulse: 89 89  Resp: 19 16  Temp: 98.8 F (37.1 C) 98 F (36.7 C)  SpO2: 99% 100%   General: Pt is alert, awake, not in acute distress Cardiovascular: RRR, S1/S2 +, no rubs, no gallops Respiratory: CTA bilaterally, no wheezing, no rhonchi Abdominal: Soft, NT, ND, bowel sounds + Extremities: no edema, no cyanosis  Discharge Instructions  Discharge Instructions     Discharge instructions   Complete by: As directed    Please call call MD or return to ER for similar or worsening recurring problem that brought you to hospital or if any fever,nausea/vomiting,abdominal pain, uncontrolled pain, chest pain,  shortness of breath or any other alarming symptoms.  Please follow-up your doctor as instructed in a week time and call the office for appointment.  Please avoid alcohol, smoking, or any other illicit substance and maintain healthy habits  including taking your regular medications as prescribed.  You were cared for by a hospitalist during your hospital stay. If you have any questions about your discharge medications or the care you received while you were in the hospital after you are discharged, you can call the unit and ask to speak with the hospitalist on call if the hospitalist that took care of you is not available.  Once you are discharged, your primary care physician will handle any further medical issues.  Please note that NO REFILLS for any discharge medications will be authorized once you are discharged, as it is imperative that you return to your primary care physician (or establish a relationship with a primary care physician if you do not have one) for your aftercare needs so that they can reassess your need for medications and monitor your lab values   Increase activity slowly   Complete by: As directed       Allergies as of 10/20/2022       Reactions   Metoclopramide Other (See Comments)   Developed restless leg, akathisia type limb movements.    Codeine Itching, Rash   Hydrocodone Rash   Penicillins Itching   Tolerated Unasyn 02/01/2020   Percocet [oxycodone-acetaminophen] Hives, Rash   Tolerates dilaudid Tolerates acetaminophen         Medication List     STOP taking these medications    amLODipine 10 MG tablet Commonly known as: NORVASC   Entresto 97-103 MG Generic drug: sacubitril-valsartan   isosorbide-hydrALAZINE 20-37.5 MG tablet Commonly known as: BiDil   spironolactone 50 MG tablet Commonly known as: ALDACTONE   Victoza 18 MG/3ML Sopn Generic drug: liraglutide       TAKE these medications    Accu-Chek Guide test strip Generic drug: glucose blood 1 each by Other route 2 (two) times daily. And lancets 2/day   OneTouch Ultra test strip Generic drug: glucose blood Test blood sugar 4 times a day   Advair HFA 230-21 MCG/ACT inhaler Generic drug: fluticasone-salmeterol Inhale 2 puffs into the lungs 2 (two) times daily.   albuterol (2.5 MG/3ML) 0.083% nebulizer solution Commonly known as: PROVENTIL Take 2.5 mg by nebulization as needed for wheezing.   ALPRAZolam 0.5 MG tablet Commonly known as: XANAX Take 0.5 mg by mouth as needed for anxiety or sleep.   apixaban 2.5 MG Tabs tablet Commonly known as: ELIQUIS Take 1 tablet (2.5 mg total) by mouth 2 (two) times daily.   ARIPiprazole 5 MG tablet Commonly known as: ABILIFY   Azathioprine 75 MG  Tabs Take 150 mg by mouth 2 (two) times daily.   B-D UF III MINI PEN NEEDLES 31G X 5 MM Misc Generic drug: Insulin Pen Needle USE AS DIRECTED TWICE A DAY   Easy Touch Pen Needles 32G X 4 MM Misc Generic drug: Insulin Pen Needle USE TO INJECT INSULIN TWICE DAILY   Benlysta 200 MG/ML Soaj Generic drug: Belimumab Inject 200 mg into the skin once a week. Thursdays   buPROPion 300 MG 24 hr tablet Commonly known as: WELLBUTRIN XL Take 300 mg by mouth daily.   carvedilol 25 MG tablet Commonly known as: COREG Take 1.5 tablets (37.5 mg total) by mouth 2 (two) times daily with a meal.   cromolyn 4 % ophthalmic solution Commonly known as: OPTICROM Place 1 drop into both eyes 2 (two) times daily.   cycloSPORINE 0.05 % ophthalmic emulsion Commonly known as: RESTASIS Place 1 drop into both eyes in the morning, at noon, and at bedtime.  diclofenac Sodium 1 % Gel Commonly known as: VOLTAREN APPLY 2 GRAMS TO AFFECTED AREA 4 TIMES A DAY What changed: See the new instructions.   famotidine 20 MG tablet Commonly known as: PEPCID Take 20 mg by mouth 2 (two) times daily.   fluticasone 50 MCG/ACT nasal spray Commonly known as: FLONASE Place 2 sprays into both nostrils continuous as needed for allergies or rhinitis.   FreeStyle Libre 3 Sensor Misc 1 Device by Does not apply route every 14 (fourteen) days. Apply 1 sensor on upper arm every 14 days for continuous glucose monitoring   HumuLIN R U-500 KwikPen 500 UNIT/ML KwikPen Generic drug: insulin regular human CONCENTRATED 100 UNITS, 30 MINUTES BEFORE EACH MEAL What changed: See the new instructions.   hydroxychloroquine 200 MG tablet Commonly known as: PLAQUENIL Take 200 mg by mouth 2 (two) times daily.   ibuprofen 600 MG tablet Commonly known as: ADVIL Take 1 tablet (600 mg total) by mouth every 6 (six) hours as needed. What changed: reasons to take this   Insulin Syringe-Needle U-100 31G X 5/16" 1 ML Misc Commonly known as:  Easy Touch Insulin Syringe USE AS DIRECTED THREE TIMES A DAY   Jardiance 10 MG Tabs tablet Generic drug: empagliflozin TAKE 1 TABLET (10 MG TOTAL) BY MOUTH DAILY WITH BREAKFAST.   levothyroxine 50 MCG tablet Commonly known as: SYNTHROID Take 1 tablet (50 mcg total) by mouth daily at 6 (six) AM.   megestrol 40 MG tablet Commonly known as: MEGACE Take 40 mg by mouth daily.   metFORMIN 500 MG 24 hr tablet Commonly known as: GLUCOPHAGE-XR Take 2 tablets (1,000 mg total) by mouth 2 (two) times daily.   multivitamin with minerals Tabs tablet Take 1 tablet by mouth daily.   ondansetron 4 MG disintegrating tablet Commonly known as: ZOFRAN-ODT Take 1 tablet (4 mg total) by mouth every 8 (eight) hours as needed for nausea or vomiting.   onetouch ultrasoft lancets Test blood sugar 4 times a day   oxybutynin 5 MG 24 hr tablet Commonly known as: DITROPAN-XL Take 5 mg by mouth daily.   pregabalin 75 MG capsule Commonly known as: LYRICA Take 75 mg 3 (three) times daily by mouth.   prochlorperazine 10 MG tablet Commonly known as: COMPAZINE Take 1 tablet (10 mg total) by mouth every 6 (six) hours as needed for nausea or vomiting.   promethazine 25 MG tablet Commonly known as: PHENERGAN Take 1 tablet (25 mg total) by mouth every 8 (eight) hours as needed for nausea or vomiting.   QUEtiapine 300 MG tablet Commonly known as: SEROQUEL Take 300 mg by mouth at bedtime.   rosuvastatin 20 MG tablet Commonly known as: CRESTOR Take 1 tablet (20 mg total) by mouth daily.   Semaglutide (2 MG/DOSE) 8 MG/3ML Sopn Inject 2 mg as directed once a week.   sucralfate 1 GM/10ML suspension Commonly known as: CARAFATE Take 10 mLs by mouth 2 (two) times daily as needed (abdomen pain).   topiramate 25 MG tablet Commonly known as: TOPAMAX Take 75 mg by mouth at bedtime.   torsemide 20 MG tablet Commonly known as: DEMADEX Take 1 tablet (20 mg total) by mouth at bedtime.   Transderm-Scop 1  MG/3DAYS Generic drug: scopolamine Place 1 patch onto the skin every 3 (three) days. As needed for nausea   Vitamin D (Ergocalciferol) 1.25 MG (50000 UNIT) Caps capsule Commonly known as: DRISDOL Take 50,000 Units by mouth once a week.        Follow-up Information  Bartholome Bill, MD Follow up.   Specialty: Family Medicine Contact information: [address] [facility] [place] [address] [phone]         Bartholome Bill, MD Follow up in 1 week(s).   Specialty: Family Medicine Contact information: [address] [place] [address] [phone]         Adrian Prows, MD Follow up in 1 week(s).   Specialty: Cardiology Contact information: [place] [address] [phone]                Allergies  Allergen Reactions   Metoclopramide Other (See Comments)    Developed restless leg, akathisia type limb movements.    Codeine Itching and Rash   Hydrocodone Rash   Penicillins Itching    Tolerated Unasyn 02/01/2020   Percocet [Oxycodone-Acetaminophen] Hives and Rash    Tolerates dilaudid Tolerates acetaminophen     The results of significant diagnostics from this hospitalization (including imaging, microbiology, ancillary and laboratory) are listed below for reference.    Microbiology: Recent Results (from the past 240 hour(s))  Urine Culture     Status: Abnormal   Collection Time: 10/15/22 11:53 AM   Specimen: Urine, Clean Catch  Result Value Ref Range Status   Specimen Description URINE, CLEAN CATCH  Final   Special Requests NONE  Final   Culture (A)  Final    60,000 COLONIES/mL STREPTOCOCCUS AGALACTIAE TESTING AGAINST S. AGALACTIAE NOT ROUTINELY PERFORMED DUE TO PREDICTABILITY OF AMP/PEN/VAN SUSCEPTIBILITY. Performed at [facility], [place] [address], [place], [place] [address]    Report Status 10/17/2022 FINAL  Final     Procedures/Studies: DG Chest Portable 1 View  Result Date: 10/15/2022 CLINICAL DATA:  Chest pain EXAM: PORTABLE CHEST 1 VIEW COMPARISON:  Radiograph 10/14/2022 FINDINGS: Unchanged cardiomegaly. There are mild lower lung predominant interstitial opacities. No large effusion or evidence of pneumothorax. Bones are unchanged. IMPRESSION: Unchanged cardiomegaly. Mild lower lung predominant interstitial opacities, could reflect mild interstitial edema. Electronically Signed   By: Maurine Simmering M.D.   On: 10/15/2022 11:07   CT ABDOMEN PELVIS W CONTRAST  Result Date: 10/14/2022 CLINICAL DATA:  Substernal chest discomfort. Periumbilical abdominal pain, nausea, and vomiting. EXAM: CT ABDOMEN AND PELVIS WITH CONTRAST TECHNIQUE: Multidetector CT imaging of the abdomen and pelvis was performed using the standard protocol following bolus administration of intravenous contrast. RADIATION DOSE REDUCTION: This exam was performed according to the departmental dose-optimization program which includes automated exposure control, adjustment of the mA and/or kV according to patient size and/or use of iterative reconstruction technique. CONTRAST:  166mL OMNIPAQUE IOHEXOL 350 MG/ML SOLN COMPARISON:  05/09/2022. FINDINGS: Lower chest: The heart is enlarged. Mild atelectasis is noted at the lung bases. Hepatobiliary: A subcentimeter hypodensity is present in the anterior right lobe of the liver which is too small to further characterize. No biliary ductal dilatation. The gallbladder is without stones. Pancreas: Unremarkable. No pancreatic ductal dilatation or surrounding inflammatory changes. Spleen: Normal in size without focal abnormality. Adrenals/Urinary Tract: No adrenal nodule or mass. The kidneys enhance symmetrically. A nonobstructive renal calculus is noted on the right. No hydronephrosis bilaterally the bladder is unremarkable. Stomach/Bowel: There is a small hiatal hernia. Stomach is within normal limits. Appendix appears  normal. No evidence of bowel wall thickening, distention, or inflammatory changes. No free air or pneumatosis. Vascular/Lymphatic: No significant vascular findings are present. No enlarged abdominal or pelvic lymph nodes. Reproductive: The uterus is  within normal limits. There is a 7 mm fat attenuation lesion in the left adnexa, possible dermoid. No adnexal mass on the right. Other: No abdominopelvic ascites. Musculoskeletal: Mild degenerative changes in the lumbar spine. No acute or suspicious osseous abnormality. IMPRESSION: 1. No acute intra-abdominal process. 2. Small hiatal hernia. Electronically Signed   By: Brett Fairy M.D.   On: 10/14/2022 04:41   DG Chest Port 1 View  Result Date: 10/14/2022 CLINICAL DATA:  Shortness of breath EXAM: PORTABLE CHEST 1 VIEW COMPARISON:  05/09/2022 FINDINGS: The heart size and mediastinal contours are within normal limits. Both lungs are clear. The visualized skeletal structures are unremarkable. IMPRESSION: No active disease. Electronically Signed   By: Ulyses Jarred M.D.   On: 10/14/2022 01:16    Labs: BNP (last 3 results) Recent Labs    02/25/22 0144 10/14/22 0004  BNP 14.5 [id number]*   Basic Metabolic Panel: Recent Labs  Lab 10/16/22 0314 10/17/22 0412 10/18/22 0453 10/19/22 0249 10/20/22 0231  NA 138 136 138 138 136  K 4.0 3.3* 3.5 3.7 3.2*  CL 108 103 106 103 105  CO2 18* 21* 20* 23 23  GLUCOSE 169* 157* 198* 241* 225*  BUN 13 12 16 12 14   CREATININE 0.79 1.03* 1.31* 1.34* 1.33*  CALCIUM 8.9 8.6* 8.3* 8.6* 8.3*   Liver Function Tests: Recent Labs  Lab 10/14/22 0004 10/15/22 1118 10/16/22 0314 10/17/22 0412  AST 15 13* 14* 10*  ALT 10 9 10 8   ALKPHOS 101 82 89 85  BILITOT 1.1 0.8 0.9 0.8  PROT 7.2 6.3* 6.4* 6.0*  ALBUMIN 3.7 3.1* 3.1* 2.8*   Recent Labs  Lab 10/14/22 0004 10/15/22 1118  LIPASE 29 39   No results for input(s): "AMMONIA" in the last 168 hours. CBC: Recent Labs  Lab 10/15/22 1118 10/16/22 0314  10/16/22 1000 10/17/22 0412 10/18/22 0453 10/19/22 0249 10/20/22 0231  WBC 14.4*   < > 18.1* 16.8* 13.8* 14.7* 13.0*  NEUTROABS 11.5*  --   --   --   --   --   --   HGB 10.6*   < > 10.7* 10.9* 10.5* 10.6* 10.3*  HCT 33.4*   < > 31.9* 33.3* 31.4* 31.8* 31.4*  MCV 92.3   < > 88.6 89.0 88.0 87.8 89.2  PLT 237   < > 245 244 221 267 264   < > = values in this interval not displayed.   Cardiac Enzymes: No results for input(s): "CKTOTAL", "CKMB", "CKMBINDEX", "TROPONINI" in the last 168 hours. BNP: Invalid input(s): "POCBNP" CBG: Recent Labs  Lab 10/19/22 1559 10/19/22 2140 10/20/22 0743 10/20/22 0948 10/20/22 1207  GLUCAP 222* 301* 220* 212* 236*   D-Dimer No results for input(s): "DDIMER" in the last 72 hours. Hgb A1c No results for input(s): "HGBA1C" in the last 72 hours. Lipid Profile No results for input(s): "CHOL", "HDL", "LDLCALC", "TRIG", "CHOLHDL", "LDLDIRECT" in the last 72 hours. Thyroid function studies No results for input(s): "TSH", "T4TOTAL", "T3FREE", "THYROIDAB" in the last 72 hours.  Invalid input(s): "FREET3" Anemia work up No results for input(s): "VITAMINB12", "FOLATE", "FERRITIN", "TIBC", "IRON", "RETICCTPCT" in the last 72 hours. Urinalysis    Component Value Date/Time   COLORURINE YELLOW 10/15/2022 1032   APPEARANCEUR HAZY (A) 10/15/2022 1032   LABSPEC 1.030 10/15/2022 1032   PHURINE 5.0 10/15/2022 1032   GLUCOSEU >=500 (A) 10/15/2022 1032   HGBUR NEGATIVE 10/15/2022 1032   BILIRUBINUR NEGATIVE 10/15/2022 1032   KETONESUR 20 (A) 10/15/2022 1032   PROTEINUR  NEGATIVE 10/15/2022 1032   UROBILINOGEN 0.2 02/27/2015 1733   NITRITE NEGATIVE 10/15/2022 1032   LEUKOCYTESUR LARGE (A) 10/15/2022 1032   Sepsis Labs Recent Labs  Lab 10/17/22 0412 10/18/22 0453 10/19/22 0249 10/20/22 0231  WBC 16.8* 13.8* 14.7* 13.0*   Microbiology Recent Results (from the past 240 hour(s))  Urine Culture     Status: Abnormal   Collection Time: 10/15/22 11:53 AM    Specimen: Urine, Clean Catch  Result Value Ref Range Status   Specimen Description URINE, CLEAN CATCH  Final   Special Requests NONE  Final   Culture (A)  Final    60,000 COLONIES/mL STREPTOCOCCUS AGALACTIAE TESTING AGAINST S. AGALACTIAE NOT ROUTINELY PERFORMED DUE TO PREDICTABILITY OF AMP/PEN/VAN SUSCEPTIBILITY. Performed at [facility], [place] [address] [place], [place] [address]    Report Status 10/17/2022 FINAL  Final  Time coordinating discharge: 25 minutes  SIGNED: Antonieta Pert, MD  [facility] 10/20/2022, 2:56 PM  If 7PM-7AM, please contact night-coverage [email or web address]

## 2022-10-20 NOTE — Inpatient Diabetes Management (Signed)
Inpatient Diabetes Program Recommendations  AACE/ADA: New Consensus Statement on Inpatient Glycemic Control (2015)  Target Ranges:  Prepandial:   less than 140 mg/dL      Peak postprandial:   less than 180 mg/dL (1-2 hours)      Critically ill patients:  140 - 180 mg/dL   Lab Results  Component Value Date   GLUCAP 212 (H) 10/20/2022   HGBA1C 7.4 (H) 05/23/2022    Review of Glycemic Control  Latest Reference Range & Units 10/19/22 07:51 10/19/22 11:33 10/19/22 15:59 10/19/22 21:40 10/20/22 07:43  Glucose-Capillary 70 - 99 mg/dL 253 (H) 222 (H) 222 (H) 301 (H) 220 (H)  (H): Data is abnormally high  Diabetes history: Type 2 DM Outpatient Diabetes medications: U-500 100 units TID, Metformin 1000 mg BID, Ozempic 2 mg qwk, Jardiance 10 mg QD Current orders for Inpatient glycemic control:  Novolog 0-15 units TID & HS Semglee 10 units Daily   Inpatient Diabetes Program Recommendations:     Consider increasing Semglee to 15 units QD Novolog 2 units TID with meals if she consumes at least 50%  Will continue to follow while inpatient.  Thank you, Reche Dixon, MSN, [place] Diabetes Coordinator Inpatient Diabetes Program [phone] (team pager from 8a-5p)

## 2022-10-20 NOTE — Progress Notes (Signed)
Discharge instructions given. Patient verbalized understanding and all questions were answered. Patient's sister will be here at [facility].

## 2022-10-21 ENCOUNTER — Encounter (HOSPITAL_COMMUNITY): Payer: Self-pay

## 2022-10-21 ENCOUNTER — Other Ambulatory Visit: Payer: Self-pay

## 2022-10-21 ENCOUNTER — Emergency Department (HOSPITAL_COMMUNITY)
Admission: EM | Admit: 2022-10-21 | Discharge: 2022-10-21 | Disposition: A | Discharge status: Home / Self Care | Payer: Medicaid Other | Attending: Emergency Medicine | Admitting: Emergency Medicine

## 2022-10-21 DIAGNOSIS — E119 Type 2 diabetes mellitus without complications: Secondary | ICD-10-CM | POA: Diagnosis not present

## 2022-10-21 DIAGNOSIS — I110 Hypertensive heart disease with heart failure: Secondary | ICD-10-CM | POA: Diagnosis not present

## 2022-10-21 DIAGNOSIS — R1084 Generalized abdominal pain: Secondary | ICD-10-CM

## 2022-10-21 DIAGNOSIS — Z794 Long term (current) use of insulin: Secondary | ICD-10-CM | POA: Diagnosis not present

## 2022-10-21 DIAGNOSIS — Z79899 Other long term (current) drug therapy: Secondary | ICD-10-CM | POA: Insufficient documentation

## 2022-10-21 DIAGNOSIS — I509 Heart failure, unspecified: Secondary | ICD-10-CM | POA: Insufficient documentation

## 2022-10-21 DIAGNOSIS — R112 Nausea with vomiting, unspecified: Secondary | ICD-10-CM | POA: Diagnosis present

## 2022-10-21 DIAGNOSIS — Z7984 Long term (current) use of oral hypoglycemic drugs: Secondary | ICD-10-CM | POA: Diagnosis not present

## 2022-10-21 DIAGNOSIS — D72829 Elevated white blood cell count, unspecified: Secondary | ICD-10-CM | POA: Diagnosis not present

## 2022-10-21 DIAGNOSIS — Z7901 Long term (current) use of anticoagulants: Secondary | ICD-10-CM | POA: Insufficient documentation

## 2022-10-21 LAB — URINALYSIS, ROUTINE W REFLEX MICROSCOPIC
Bacteria, UA: NONE SEEN
Bilirubin Urine: NEGATIVE
Glucose, UA: 50 mg/dL — AB
Hgb urine dipstick: NEGATIVE
Ketones, ur: NEGATIVE mg/dL
Leukocytes,Ua: NEGATIVE
Nitrite: NEGATIVE
Protein, ur: 100 mg/dL — AB
Specific Gravity, Urine: 1.023 (ref 1.005–1.030)
pH: 8 (ref 5.0–8.0)

## 2022-10-21 LAB — COMPREHENSIVE METABOLIC PANEL
ALT: 11 U/L (ref 0–44)
AST: 16 U/L (ref 15–41)
Albumin: 2.9 g/dL — ABNORMAL LOW (ref 3.5–5.0)
Alkaline Phosphatase: 112 U/L (ref 38–126)
Anion gap: 9 (ref 5–15)
BUN: 7 mg/dL (ref 6–20)
CO2: 22 mmol/L (ref 22–32)
Calcium: 9.1 mg/dL (ref 8.9–10.3)
Chloride: 106 mmol/L (ref 98–111)
Creatinine, Ser: 0.94 mg/dL (ref 0.44–1.00)
GFR, Estimated: >60 mL/min (ref >60)
Glucose, Bld: 225 mg/dL — ABNORMAL HIGH (ref 70–99)
Potassium: 3.8 mmol/L (ref 3.5–5.1)
Sodium: 137 mmol/L (ref 135–145)
Total Bilirubin: 0.7 mg/dL (ref 0.3–1.2)
Total Protein: 6.9 g/dL (ref 6.5–8.1)

## 2022-10-21 LAB — CBC
HCT: 33.1 % — ABNORMAL LOW (ref 36.0–46.0)
Hemoglobin: 10.8 g/dL — ABNORMAL LOW (ref 12.0–15.0)
MCH: 29.2 pg (ref 26.0–34.0)
MCHC: 32.6 g/dL (ref 30.0–36.0)
MCV: 89.5 fL (ref 80.0–100.0)
Platelets: 306 10*3/uL (ref 150–400)
RBC: 3.7 MIL/uL — ABNORMAL LOW (ref 3.87–5.11)
RDW: 13.9 % (ref 11.5–15.5)
WBC: 18.2 10*3/uL — ABNORMAL HIGH (ref 4.0–10.5)
nRBC: 0 % (ref 0.0–0.2)

## 2022-10-21 LAB — LIPASE, BLOOD: Lipase: 32 U/L (ref 11–51)

## 2022-10-21 MED ORDER — PROMETHAZINE HCL 25 MG RE SUPP: 25.0000 mg | Freq: Four times a day (QID) | RECTAL | 0 refills | Status: DC | PRN

## 2022-10-21 MED ORDER — LACTATED RINGERS IV BOLUS
1000.0000 mL | Freq: Once | INTRAVENOUS | Status: AC
  Administered 2022-10-21: 1000 mL via INTRAVENOUS

## 2022-10-21 MED ORDER — TRANSDERM-SCOP 1 MG/3DAYS TD PT72: 1.0000 | MEDICATED_PATCH | TRANSDERMAL | 0 refills | Status: DC

## 2022-10-21 MED ORDER — DROPERIDOL 2.5 MG/ML IJ SOLN
2.5000 mg | Freq: Once | INTRAMUSCULAR | Status: AC
  Administered 2022-10-21: 2.5 mg via INTRAVENOUS
  Filled 2022-10-21: qty 2

## 2022-10-21 NOTE — ED Notes (Signed)
Pt given water and sandwich bag. Able to tolerate with no difficulty.

## 2022-10-21 NOTE — Discharge Instructions (Addendum)
Thank you for allowing me be part of your care today.  Your workup here was overall reassuring.  I am sending you home with 2 different nausea medications to try when you are feeling very nauseous or have vomiting.  The first medication is a Phenergan suppository.  You may use this rectally every 6 hours as needed for nausea and vomiting.  This form of phenergan tends to work better than oral.  I am also sending in a prescription for Scopolamine patches for nausea.  Please use these as prescribed.   Please schedule a follow-up appointment with your primary care provider regarding your nausea and vomiting. I have also provided information regarding a bland diet that may help with your symptoms. It is important for you to maintain good hydration.  Increase your fluid intake including water and sugar free electrolyte drinks.    Return to the ED if you have worsening of your symptoms, are unable to keep liquids down despite medications, or if you have any new concerns.

## 2022-10-21 NOTE — ED Provider Notes (Signed)
[facility] Provider Note   CSN: [id number] Arrival date & time: 10/21/22  1614     History {Add pertinent medical, surgical, social history, OB history to HPI:1} Chief Complaint  Patient presents with   Nausea    [name] is a 52 y.o. female        Home Medications Prior to Admission medications   Medication Sig Start Date End Date Taking? Authorizing Provider  ADVAIR HFA 230-21 MCG/ACT inhaler Inhale 2 puffs into the lungs 2 (two) times daily. 12/20/20   [provider]  albuterol (PROVENTIL) (2.5 MG/3ML) 0.083% nebulizer solution Take 2.5 mg by nebulization as needed for wheezing.    [provider]  ALPRAZolam Duanne Moron) 0.5 MG tablet Take 0.5 mg by mouth as needed for anxiety or sleep. 04/19/17   [provider]  apixaban (ELIQUIS) 2.5 MG TABS tablet Take 1 tablet (2.5 mg total) by mouth 2 (two) times daily. 09/21/22   Adrian Prows, MD  ARIPiprazole (ABILIFY) 5 MG tablet  01/14/21   [provider]  Azathioprine 75 MG TABS Take 150 mg by mouth 2 (two) times daily. 02/04/21   [provider]  B-D UF III MINI PEN NEEDLES 31G X 5 MM MISC USE AS DIRECTED TWICE A DAY 10/31/18   Renato Shin, MD  Belimumab (BENLYSTA) 200 MG/ML SOAJ Inject 200 mg into the skin once a week. Thursdays 12/19/19   [provider]  buPROPion (WELLBUTRIN XL) 300 MG 24 hr tablet Take 300 mg by mouth daily. 03/14/22   [provider]  carvedilol (COREG) 25 MG tablet Take 1.5 tablets (37.5 mg total) by mouth 2 (two) times daily with a meal. 01/01/21   Adrian Prows, MD  Continuous Blood Gluc Sensor (FREESTYLE LIBRE 3 SENSOR) MISC 1 Device by Does not apply route every 14 (fourteen) days. Apply 1 sensor on upper arm every 14 days for continuous glucose monitoring 05/25/22   Elayne Snare, MD  cromolyn (OPTICROM) 4 % ophthalmic solution Place 1 drop into both eyes 2 (two) times daily. 01/14/21   [provider]   cycloSPORINE (RESTASIS) 0.05 % ophthalmic emulsion Place 1 drop into both eyes in the morning, at noon, and at bedtime.     [provider]  diclofenac Sodium (VOLTAREN) 1 % GEL APPLY 2 GRAMS TO AFFECTED AREA 4 TIMES A DAY Patient taking differently: Apply 2 g topically 4 (four) times daily as needed (pain). 01/11/22   Cantwell, Celeste C, PA-C  EASY TOUCH PEN NEEDLES 32G X 4 MM MISC USE TO INJECT INSULIN TWICE DAILY 08/07/19   Renato Shin, MD  famotidine (PEPCID) 20 MG tablet Take 20 mg by mouth 2 (two) times daily.    [provider]  fluticasone (FLONASE) 50 MCG/ACT nasal spray Place 2 sprays into both nostrils continuous as needed for allergies or rhinitis.    [provider]  glucose blood (ACCU-CHEK GUIDE) test strip 1 each by Other route 2 (two) times daily. And lancets 2/day 06/18/21   Renato Shin, MD  glucose blood [facility] ULTRA) test strip Test blood sugar 4 times a day 03/30/22   Elayne Snare, MD  hydroxychloroquine (PLAQUENIL) 200 MG tablet Take 200 mg by mouth 2 (two) times daily.    [provider]  ibuprofen (ADVIL) 600 MG tablet Take 1 tablet (600 mg total) by mouth every 6 (six) hours as needed. Patient taking differently: Take 600 mg by mouth every 6 (six) hours as needed for  mild pain. 10/14/22   Montine Circle, PA-C  insulin regular human CONCENTRATED (HUMULIN R U-500 KWIKPEN) 500 UNIT/ML KwikPen 100 UNITS, 30 MINUTES BEFORE EACH MEAL Patient taking differently: 100 Units 3 (three) times daily with meals. 100 Units, 30 minutes before each meal 08/16/22   Elayne Snare, MD  Insulin Syringe-Needle U-100 (EASY TOUCH INSULIN SYRINGE) 31G X 5/16" 1 ML MISC USE AS DIRECTED THREE TIMES A DAY 10/12/18   Renato Shin, MD  JARDIANCE 10 MG TABS tablet TAKE 1 TABLET (10 MG TOTAL) BY MOUTH DAILY WITH BREAKFAST. 08/18/22   Elayne Snare, MD  Lancets [facility] ULTRASOFT) lancets Test blood sugar 4 times a day 03/30/22   Elayne Snare, MD  levothyroxine (SYNTHROID)  50 MCG tablet Take 1 tablet (50 mcg total) by mouth daily at 6 (six) AM. 08/30/19   Raiford Noble Latif, DO  megestrol (MEGACE) 40 MG tablet Take 40 mg by mouth daily. 01/30/15   [provider]  metFORMIN (GLUCOPHAGE-XR) 500 MG 24 hr tablet Take 2 tablets (1,000 mg total) by mouth 2 (two) times daily. 02/02/21   Renato Shin, MD  Multiple Vitamin (MULTIVITAMIN WITH MINERALS) TABS tablet Take 1 tablet by mouth daily. 08/30/19   Raiford Noble Latif, DO  ondansetron (ZOFRAN-ODT) 4 MG disintegrating tablet Take 1 tablet (4 mg total) by mouth every 8 (eight) hours as needed for nausea or vomiting. 10/14/22   Montine Circle, PA-C  oxybutynin (DITROPAN-XL) 5 MG 24 hr tablet Take 5 mg by mouth daily. 04/25/22   [provider]  pregabalin (LYRICA) 75 MG capsule Take 75 mg 3 (three) times daily by mouth.  01/28/15   [provider]  prochlorperazine (COMPAZINE) 10 MG tablet Take 1 tablet (10 mg total) by mouth every 6 (six) hours as needed for nausea or vomiting. 05/14/22   Bonnielee Haff, MD  promethazine (PHENERGAN) 25 MG tablet Take 1 tablet (25 mg total) by mouth every 8 (eight) hours as needed for nausea or vomiting. 10/14/22   Montine Circle, PA-C  QUEtiapine (SEROQUEL) 300 MG tablet Take 300 mg by mouth at bedtime. 01/08/21   [provider]  rosuvastatin (CRESTOR) 20 MG tablet Take 1 tablet (20 mg total) by mouth daily. 06/09/21 10/16/22  Cantwell, Celeste C, PA-C  Semaglutide, 2 MG/DOSE, 8 MG/3ML SOPN Inject 2 mg as directed once a week. 08/18/22   Elayne Snare, MD  sucralfate (CARAFATE) 1 GM/10ML suspension Take 10 mLs by mouth 2 (two) times daily as needed (abdomen pain). 12/09/20   [provider]  topiramate (TOPAMAX) 25 MG tablet Take 75 mg by mouth at bedtime. 03/17/16   [provider]  torsemide (DEMADEX) 20 MG tablet Take 1 tablet (20 mg total) by mouth at bedtime. 06/09/21   Cantwell, Celeste C, PA-C  TRANSDERM-SCOP 1 MG/3DAYS Place 1 patch onto the  skin every 3 (three) days. As needed for nausea 12/22/21   [provider]  Vitamin D, Ergocalciferol, (DRISDOL) 1.25 MG (50000 UNIT) CAPS capsule Take 50,000 Units by mouth once a week. 09/20/22   [provider]      Allergies    Metoclopramide, Codeine, Hydrocodone, Penicillins, and Percocet [oxycodone-acetaminophen]    Review of Systems   Review of Systems  Physical Exam Updated Vital Signs BP (!) 160/94   Temp 99.9 F (37.7 C) (Oral)   Resp 17   Ht 5\' 8"  (1.727 m)   Wt (!) 140.6 kg   LMP 03/24/2017   SpO2 100%   BMI 47.14 kg/m  Physical Exam  ED Results / Procedures / Treatments   Labs (all labs ordered are listed, but only abnormal results are displayed) Labs Reviewed  COMPREHENSIVE METABOLIC PANEL - Abnormal; Notable for the following components:      Result Value   Glucose, Bld 225 (*)    Albumin 2.9 (*)    All other components within normal limits  CBC - Abnormal; Notable for the following components:   WBC 18.2 (*)    RBC 3.70 (*)    Hemoglobin 10.8 (*)    HCT 33.1 (*)    All other components within normal limits  LIPASE, BLOOD  URINALYSIS, ROUTINE W REFLEX MICROSCOPIC    EKG None  Radiology No results found.  Procedures Procedures  {Document cardiac monitor, telemetry assessment procedure when appropriate:1}  Medications Ordered in ED Medications  lactated ringers bolus 1,000 mL (has no administration in time range)    ED Course/ Medical Decision Making/ A&P Clinical Course as of 10/21/22 1933  Ludwig Clarks Oct 21, 2022  1929 I was consulted regarding care plan for this patient.  Recent discharge from cyclic vomiting syndrome representing with similar.  Plan for symptomatic care IV fluids and outpatient care management. [CC]    Clinical Course User Index [CC] Tretha Sciara, MD   {   Click here for ABCD2, HEART and other calculatorsREFRESH Note before signing :1}                          Medical Decision  Making Risk Prescription drug management.   ***  {Document critical care time when appropriate:1} {Document review of labs and clinical decision tools ie heart score, Chads2Vasc2 etc:1}  {Document your independent review of radiology images, and any outside records:1} {Document your discussion with family members, caretakers, and with consultants:1} {Document social determinants of health affecting pt's care:1} {Document your decision making why or why not admission, treatments were needed:1} Final Clinical Impression(s) / ED Diagnoses Final diagnoses:  None    Rx / DC Orders ED Discharge Orders     None

## 2022-10-21 NOTE — ED Provider Triage Note (Signed)
Emergency Medicine Provider Triage Evaluation Note  [name] , a 52 y.o. female  was evaluated in triage.  Patient complains of abdominal pain, nausea and vomiting.  Reports that she was discharged yesterday for the same.  History of gastroparesis.  Was not discharged with any medications.  Review of Systems  Positive:  Negative:   Physical Exam  LMP 03/24/2017  Gen:   Awake, no distress   Resp:  Normal effort  MSK:   Moves extremities without difficulty  Other:    Medical Decision Making  Medically screening exam initiated at 4:48 PM.  Appropriate orders placed.  [name] was informed that the remainder of the evaluation will be completed by another provider, this initial triage assessment does not replace that evaluation, and the importance of remaining in the ED until their evaluation is complete.     Rhae Hammock, [place] 10/21/22 1648

## 2022-10-21 NOTE — ED Triage Notes (Signed)
Pt reports she is here with c/o n/v and abd pain. She states she was discharged from this hospital yesterday after being here for a week for intractable nausea and vomiting. Emesis x 3 today.

## 2022-10-21 NOTE — ED Provider Notes (Incomplete)
[facility] Provider Note   CSN: [id number] Arrival date & time: 10/21/22  1614     History {Add pertinent medical, surgical, social history, OB history to HPI:1} Chief Complaint  Patient presents with  . Nausea    [name] is a 52 y.o. female with past medical history as detailed below presents to the ED with continued complaint of nausea, vomiting, and abdominal pain.  Patient was discharged from the hospital yesterday for same.  She reports she was able to tolerate food and drink yesterday, but began throwing up again this morning and has not been able to keep anything down.  Patient reports the prescribed Zofran and Phenergan do not help with her symptoms.  Denies fever, chills, diarrhea, constipation, dizziness, lightheadedness, syncope, weakness.     Past Medical History:  Diagnosis Date  . Acute renal failure (HCC)     Intractable nausea vomiting secondary to diabetic gastroparesis causing dehydration and acute renal failure /notes 04/01/2013  . Anginal pain ([place])   . Anxiety   . Bell's palsy 08/09/2010  . Bicornuate uterus   . CAP (community acquired pneumonia) 10/2011   Archie Endo 11/08/2011  . Chest pain 01/13/2014  . CHF (congestive heart failure) ([place])   . Diabetic gastroparesis associated with type 2 diabetes mellitus ([place])    this is presumed diagnoses, not confirmed by any studies.   . Eczema   . Family history of breast cancer   . Family history of malignant neoplasm of breast   . Family history of malignant neoplasm of ovary   . Family history of ovarian cancer   . Family history of prostate cancer   . GERD (gastroesophageal reflux disease)   . High cholesterol   . Hypertension   . Liver lesion 08/13/2019  . Liver mass 2014   biopsied 03/2013 at [facility], not malignant.  is to undergo radiologic ablation of the mass in sept/October 2014.   . Lupus ([place])   . Migraines    "maybe a couple times/yr" (04/01/2013)   . Obesity   . Obstructive sleep apnea on CPAP 2011   Oxygen at nights  . Pulmonary embolism ([place]) 05/21/2021  . [place] SYNDROME 08/09/2010  . SLE (systemic lupus erythematosus) ([place])    Archie Endo 12/01/2010       Home Medications Prior to Admission medications   Medication Sig Start Date End Date Taking? Authorizing Provider  ADVAIR HFA 230-21 MCG/ACT inhaler Inhale 2 puffs into the lungs 2 (two) times daily. 12/20/20   [provider]  albuterol (PROVENTIL) (2.5 MG/3ML) 0.083% nebulizer solution Take 2.5 mg by nebulization as needed for wheezing.    [provider]  ALPRAZolam Duanne Moron) 0.5 MG tablet Take 0.5 mg by mouth as needed for anxiety or sleep. 04/19/17   [provider]  apixaban (ELIQUIS) 2.5 MG TABS tablet Take 1 tablet (2.5 mg total) by mouth 2 (two) times daily. 09/21/22   Adrian Prows, MD  ARIPiprazole (ABILIFY) 5 MG tablet  01/14/21   [provider]  Azathioprine 75 MG TABS Take 150 mg by mouth 2 (two) times daily. 02/04/21   [provider]  B-D UF III MINI PEN NEEDLES 31G X 5 MM MISC USE AS DIRECTED TWICE A DAY 10/31/18   Renato Shin, MD  Belimumab (BENLYSTA) 200 MG/ML SOAJ Inject 200 mg into the skin once a week. Thursdays 12/19/19   [provider]  buPROPion (WELLBUTRIN XL) 300 MG 24 hr tablet Take 300  mg by mouth daily. 03/14/22   [provider]  carvedilol (COREG) 25 MG tablet Take 1.5 tablets (37.5 mg total) by mouth 2 (two) times daily with a meal. 01/01/21   Adrian Prows, MD  Continuous Blood Gluc Sensor (FREESTYLE LIBRE 3 SENSOR) MISC 1 Device by Does not apply route every 14 (fourteen) days. Apply 1 sensor on upper arm every 14 days for continuous glucose monitoring 05/25/22   Elayne Snare, MD  cromolyn (OPTICROM) 4 % ophthalmic solution Place 1 drop into both eyes 2 (two) times daily. 01/14/21   [provider]  cycloSPORINE (RESTASIS) 0.05 % ophthalmic emulsion Place 1 drop into both eyes in the morning, at  noon, and at bedtime.     [provider]  diclofenac Sodium (VOLTAREN) 1 % GEL APPLY 2 GRAMS TO AFFECTED AREA 4 TIMES A DAY Patient taking differently: Apply 2 g topically 4 (four) times daily as needed (pain). 01/11/22   Cantwell, Celeste C, PA-C  EASY TOUCH PEN NEEDLES 32G X 4 MM MISC USE TO INJECT INSULIN TWICE DAILY 08/07/19   Renato Shin, MD  famotidine (PEPCID) 20 MG tablet Take 20 mg by mouth 2 (two) times daily.    [provider]  fluticasone (FLONASE) 50 MCG/ACT nasal spray Place 2 sprays into both nostrils continuous as needed for allergies or rhinitis.    [provider]  glucose blood (ACCU-CHEK GUIDE) test strip 1 each by Other route 2 (two) times daily. And lancets 2/day 06/18/21   Renato Shin, MD  glucose blood [facility] ULTRA) test strip Test blood sugar 4 times a day 03/30/22   Elayne Snare, MD  hydroxychloroquine (PLAQUENIL) 200 MG tablet Take 200 mg by mouth 2 (two) times daily.    [provider]  ibuprofen (ADVIL) 600 MG tablet Take 1 tablet (600 mg total) by mouth every 6 (six) hours as needed. Patient taking differently: Take 600 mg by mouth every 6 (six) hours as needed for mild pain. 10/14/22   Montine Circle, PA-C  insulin regular human CONCENTRATED (HUMULIN R U-500 KWIKPEN) 500 UNIT/ML KwikPen 100 UNITS, 30 MINUTES BEFORE EACH MEAL Patient taking differently: 100 Units 3 (three) times daily with meals. 100 Units, 30 minutes before each meal 08/16/22   Elayne Snare, MD  Insulin Syringe-Needle U-100 (EASY TOUCH INSULIN SYRINGE) 31G X 5/16" 1 ML MISC USE AS DIRECTED THREE TIMES A DAY 10/12/18   Renato Shin, MD  JARDIANCE 10 MG TABS tablet TAKE 1 TABLET (10 MG TOTAL) BY MOUTH DAILY WITH BREAKFAST. 08/18/22   Elayne Snare, MD  Lancets [facility] ULTRASOFT) lancets Test blood sugar 4 times a day 03/30/22   Elayne Snare, MD  levothyroxine (SYNTHROID) 50 MCG tablet Take 1 tablet (50 mcg total) by mouth daily at 6 (six) AM. 08/30/19   Raiford Noble  Latif, DO  megestrol (MEGACE) 40 MG tablet Take 40 mg by mouth daily. 01/30/15   [provider]  metFORMIN (GLUCOPHAGE-XR) 500 MG 24 hr tablet Take 2 tablets (1,000 mg total) by mouth 2 (two) times daily. 02/02/21   Renato Shin, MD  Multiple Vitamin (MULTIVITAMIN WITH MINERALS) TABS tablet Take 1 tablet by mouth daily. 08/30/19   Raiford Noble Latif, DO  ondansetron (ZOFRAN-ODT) 4 MG disintegrating tablet Take 1 tablet (4 mg total) by mouth every 8 (eight) hours as needed for nausea or vomiting. 10/14/22   Montine Circle, PA-C  oxybutynin (DITROPAN-XL) 5 MG 24 hr tablet Take 5 mg by mouth daily. 04/25/22   [provider]  pregabalin (LYRICA) 75 MG capsule Take 75 mg 3 (three) times daily by mouth.  01/28/15   [provider]  prochlorperazine (COMPAZINE) 10 MG tablet Take 1 tablet (10 mg total) by mouth every 6 (six) hours as needed for nausea or vomiting. 05/14/22   Bonnielee Haff, MD  promethazine (PHENERGAN) 25 MG tablet Take 1 tablet (25 mg total) by mouth every 8 (eight) hours as needed for nausea or vomiting. 10/14/22   Montine Circle, PA-C  QUEtiapine (SEROQUEL) 300 MG tablet Take 300 mg by mouth at bedtime. 01/08/21   [provider]  rosuvastatin (CRESTOR) 20 MG tablet Take 1 tablet (20 mg total) by mouth daily. 06/09/21 10/16/22  Cantwell, Celeste C, PA-C  Semaglutide, 2 MG/DOSE, 8 MG/3ML SOPN Inject 2 mg as directed once a week. 08/18/22   Elayne Snare, MD  sucralfate (CARAFATE) 1 GM/10ML suspension Take 10 mLs by mouth 2 (two) times daily as needed (abdomen pain). 12/09/20   [provider]  topiramate (TOPAMAX) 25 MG tablet Take 75 mg by mouth at bedtime. 03/17/16   [provider]  torsemide (DEMADEX) 20 MG tablet Take 1 tablet (20 mg total) by mouth at bedtime. 06/09/21   Cantwell, Celeste C, PA-C  TRANSDERM-SCOP 1 MG/3DAYS Place 1 patch onto the skin every 3 (three) days. As needed for nausea 12/22/21   [provider]  Vitamin D,  Ergocalciferol, (DRISDOL) 1.25 MG (50000 UNIT) CAPS capsule Take 50,000 Units by mouth once a week. 09/20/22   [provider]      Allergies    Metoclopramide, Codeine, Hydrocodone, Penicillins, and Percocet [oxycodone-acetaminophen]    Review of Systems   Review of Systems  Constitutional:  Negative for chills and fever.  Gastrointestinal:  Positive for abdominal pain, nausea and vomiting. Negative for constipation and diarrhea.  Neurological:  Negative for dizziness, syncope, weakness and light-headedness.    Physical Exam Updated Vital Signs BP (!) 160/94   Temp 99.9 F (37.7 C) (Oral)   Resp 17   Ht 5\' 8"  (1.727 m)   Wt (!) 140.6 kg   LMP 03/24/2017   SpO2 100%   BMI 47.14 kg/m  Physical Exam Vitals and nursing note reviewed.  Constitutional:      General: She is not in acute distress.    Appearance: She is not ill-appearing.  HENT:     Mouth/Throat:     Mouth: Mucous membranes are moist.     Pharynx: Oropharynx is clear.  Cardiovascular:     Rate and Rhythm: Normal rate and regular rhythm.     Pulses: Normal pulses.     Heart sounds: Normal heart sounds.  Pulmonary:     Effort: Pulmonary effort is normal. No respiratory distress.     Breath sounds: Normal breath sounds and air entry.  Abdominal:     General: Abdomen is flat. Bowel sounds are normal. There is no distension.     Palpations: Abdomen is soft.     Tenderness: There is generalized abdominal tenderness.  Skin:    General: Skin is warm and dry.     Capillary Refill: Capillary refill takes less than 2 seconds.  Neurological:     Mental Status: She is alert. Mental status is at baseline.  Psychiatric:        Mood and Affect: Mood normal. Affect is tearful.        Behavior: Behavior normal.     ED Results / Procedures / Treatments   Labs (all labs ordered are listed,  but only abnormal results are displayed) Labs Reviewed  COMPREHENSIVE METABOLIC PANEL - Abnormal; Notable for the  following components:      Result Value   Glucose, Bld 225 (*)    Albumin 2.9 (*)    All other components within normal limits  CBC - Abnormal; Notable for the following components:   WBC 18.2 (*)    RBC 3.70 (*)    Hemoglobin 10.8 (*)    HCT 33.1 (*)    All other components within normal limits  LIPASE, BLOOD  URINALYSIS, ROUTINE W REFLEX MICROSCOPIC    EKG None  Radiology No results found.  Procedures Procedures  {Document cardiac monitor, telemetry assessment procedure when appropriate:1}  Medications Ordered in ED Medications  lactated ringers bolus 1,000 mL (has no administration in time range)    ED Course/ Medical Decision Making/ A&P Clinical Course as of 10/21/22 1933  Ludwig Clarks Oct 21, 2022  1929 I was consulted regarding care plan for this patient.  Recent discharge from cyclic vomiting syndrome representing with similar.  Plan for symptomatic care IV fluids and outpatient care management. [CC]    Clinical Course User Index [CC] Tretha Sciara, MD   {   Click here for ABCD2, HEART and other calculatorsREFRESH Note before signing :1}                          Medical Decision Making Risk Prescription drug management.   This patient presents to the ED with chief complaint(s) of abdominal pain, nausea with vomiting with pertinent past medical history of HTN, DM, obesity, SLE, CHF.  The complaint involves an extensive differential diagnosis and also carries with it a high risk of complications and morbidity.    The differential diagnosis includes cyclical vomiting syndrome,    The initial plan is to ***  Additional history obtained: Additional history obtained from {additional history:26846} Records reviewed {records:26847}  Initial Assessment:   ***  Independent ECG/labs interpretation:  The following labs were independently interpreted:  ***  Independent visualization and interpretation of imaging: I independently visualized the following imaging  with scope of interpretation limited to determining acute life threatening conditions related to emergency care: ***, which revealed ***  Treatment and Reassessment: ***  Other treatment options considered:   ***  Disposition:   ***  Social Determinants of Health:   Patient's [id number]  increases the complexity of managing their presentation    {Document critical care time when appropriate:1} {Document review of labs and clinical decision tools ie heart score, Chads2Vasc2 etc:1}  {Document your independent review of radiology images, and any outside records:1} {Document your discussion with family members, caretakers, and with consultants:1} {Document social determinants of health affecting pt's care:1} {Document your decision making why or why not admission, treatments were needed:1} Final Clinical Impression(s) / ED Diagnoses Final diagnoses:  None    Rx / DC Orders ED Discharge Orders     None

## 2022-10-23 ENCOUNTER — Inpatient Hospital Stay (HOSPITAL_COMMUNITY)
Admission: EM | Admit: 2022-10-23 | Discharge: 2022-11-19 | DRG: 871 | Disposition: A | Discharge status: Home / Self Care | Payer: Medicaid Other | Attending: Family Medicine | Admitting: Family Medicine

## 2022-10-23 ENCOUNTER — Emergency Department (HOSPITAL_COMMUNITY): Payer: Medicaid Other

## 2022-10-23 ENCOUNTER — Other Ambulatory Visit: Payer: Self-pay

## 2022-10-23 DIAGNOSIS — Z880 Allergy status to penicillin: Secondary | ICD-10-CM

## 2022-10-23 DIAGNOSIS — I152 Hypertension secondary to endocrine disorders: Secondary | ICD-10-CM | POA: Diagnosis present

## 2022-10-23 DIAGNOSIS — Z803 Family history of malignant neoplasm of breast: Secondary | ICD-10-CM

## 2022-10-23 DIAGNOSIS — Z800 Family history of malignant neoplasm of digestive organs: Secondary | ICD-10-CM

## 2022-10-23 DIAGNOSIS — Z87891 Personal history of nicotine dependence: Secondary | ICD-10-CM

## 2022-10-23 DIAGNOSIS — E876 Hypokalemia: Secondary | ICD-10-CM | POA: Insufficient documentation

## 2022-10-23 DIAGNOSIS — G9340 Encephalopathy, unspecified: Secondary | ICD-10-CM | POA: Diagnosis not present

## 2022-10-23 DIAGNOSIS — Z8041 Family history of malignant neoplasm of ovary: Secondary | ICD-10-CM

## 2022-10-23 DIAGNOSIS — Z794 Long term (current) use of insulin: Secondary | ICD-10-CM

## 2022-10-23 DIAGNOSIS — N739 Female pelvic inflammatory disease, unspecified: Secondary | ICD-10-CM

## 2022-10-23 DIAGNOSIS — G2581 Restless legs syndrome: Secondary | ICD-10-CM | POA: Diagnosis present

## 2022-10-23 DIAGNOSIS — Z825 Family history of asthma and other chronic lower respiratory diseases: Secondary | ICD-10-CM

## 2022-10-23 DIAGNOSIS — J9811 Atelectasis: Secondary | ICD-10-CM | POA: Diagnosis not present

## 2022-10-23 DIAGNOSIS — Z7901 Long term (current) use of anticoagulants: Secondary | ICD-10-CM

## 2022-10-23 DIAGNOSIS — Z79890 Hormone replacement therapy (postmenopausal): Secondary | ICD-10-CM

## 2022-10-23 DIAGNOSIS — K56600 Partial intestinal obstruction, unspecified as to cause: Secondary | ICD-10-CM | POA: Diagnosis not present

## 2022-10-23 DIAGNOSIS — Z6841 Body Mass Index (BMI) 40.0 and over, adult: Secondary | ICD-10-CM

## 2022-10-23 DIAGNOSIS — Z7951 Long term (current) use of inhaled steroids: Secondary | ICD-10-CM

## 2022-10-23 DIAGNOSIS — Z86711 Personal history of pulmonary embolism: Secondary | ICD-10-CM

## 2022-10-23 DIAGNOSIS — Z886 Allergy status to analgesic agent status: Secondary | ICD-10-CM

## 2022-10-23 DIAGNOSIS — B954 Other streptococcus as the cause of diseases classified elsewhere: Secondary | ICD-10-CM | POA: Diagnosis present

## 2022-10-23 DIAGNOSIS — K121 Other forms of stomatitis: Secondary | ICD-10-CM | POA: Diagnosis present

## 2022-10-23 DIAGNOSIS — K5939 Other megacolon: Secondary | ICD-10-CM | POA: Diagnosis present

## 2022-10-23 DIAGNOSIS — M3500 Sicca syndrome, unspecified: Secondary | ICD-10-CM | POA: Diagnosis present

## 2022-10-23 DIAGNOSIS — E039 Hypothyroidism, unspecified: Secondary | ICD-10-CM | POA: Insufficient documentation

## 2022-10-23 DIAGNOSIS — G4710 Hypersomnia, unspecified: Secondary | ICD-10-CM | POA: Diagnosis present

## 2022-10-23 DIAGNOSIS — R29810 Facial weakness: Secondary | ICD-10-CM | POA: Diagnosis present

## 2022-10-23 DIAGNOSIS — Q513 Bicornate uterus: Secondary | ICD-10-CM

## 2022-10-23 DIAGNOSIS — N730 Acute parametritis and pelvic cellulitis: Secondary | ICD-10-CM | POA: Diagnosis not present

## 2022-10-23 DIAGNOSIS — Z8673 Personal history of transient ischemic attack (TIA), and cerebral infarction without residual deficits: Secondary | ICD-10-CM

## 2022-10-23 DIAGNOSIS — E8809 Other disorders of plasma-protein metabolism, not elsewhere classified: Secondary | ICD-10-CM | POA: Diagnosis present

## 2022-10-23 DIAGNOSIS — R9431 Abnormal electrocardiogram [ECG] [EKG]: Secondary | ICD-10-CM | POA: Diagnosis present

## 2022-10-23 DIAGNOSIS — E1159 Type 2 diabetes mellitus with other circulatory complications: Secondary | ICD-10-CM | POA: Diagnosis present

## 2022-10-23 DIAGNOSIS — Z7984 Long term (current) use of oral hypoglycemic drugs: Secondary | ICD-10-CM

## 2022-10-23 DIAGNOSIS — I5042 Chronic combined systolic (congestive) and diastolic (congestive) heart failure: Secondary | ICD-10-CM | POA: Diagnosis present

## 2022-10-23 DIAGNOSIS — K56609 Unspecified intestinal obstruction, unspecified as to partial versus complete obstruction: Secondary | ICD-10-CM

## 2022-10-23 DIAGNOSIS — E7800 Pure hypercholesterolemia, unspecified: Secondary | ICD-10-CM | POA: Diagnosis present

## 2022-10-23 DIAGNOSIS — F324 Major depressive disorder, single episode, in partial remission: Secondary | ICD-10-CM | POA: Diagnosis not present

## 2022-10-23 DIAGNOSIS — Z86718 Personal history of other venous thrombosis and embolism: Secondary | ICD-10-CM

## 2022-10-23 DIAGNOSIS — F419 Anxiety disorder, unspecified: Secondary | ICD-10-CM | POA: Diagnosis present

## 2022-10-23 DIAGNOSIS — D649 Anemia, unspecified: Secondary | ICD-10-CM | POA: Diagnosis present

## 2022-10-23 DIAGNOSIS — R6520 Severe sepsis without septic shock: Secondary | ICD-10-CM | POA: Diagnosis present

## 2022-10-23 DIAGNOSIS — N321 Vesicointestinal fistula: Secondary | ICD-10-CM | POA: Diagnosis present

## 2022-10-23 DIAGNOSIS — Z9109 Other allergy status, other than to drugs and biological substances: Secondary | ICD-10-CM

## 2022-10-23 DIAGNOSIS — Z885 Allergy status to narcotic agent status: Secondary | ICD-10-CM

## 2022-10-23 DIAGNOSIS — Z8042 Family history of malignant neoplasm of prostate: Secondary | ICD-10-CM

## 2022-10-23 DIAGNOSIS — N200 Calculus of kidney: Secondary | ICD-10-CM | POA: Diagnosis present

## 2022-10-23 DIAGNOSIS — A419 Sepsis, unspecified organism: Principal | ICD-10-CM

## 2022-10-23 DIAGNOSIS — Z79899 Other long term (current) drug therapy: Secondary | ICD-10-CM

## 2022-10-23 DIAGNOSIS — K219 Gastro-esophageal reflux disease without esophagitis: Secondary | ICD-10-CM | POA: Diagnosis present

## 2022-10-23 DIAGNOSIS — E1165 Type 2 diabetes mellitus with hyperglycemia: Secondary | ICD-10-CM | POA: Diagnosis present

## 2022-10-23 DIAGNOSIS — N179 Acute kidney failure, unspecified: Secondary | ICD-10-CM | POA: Diagnosis present

## 2022-10-23 DIAGNOSIS — E785 Hyperlipidemia, unspecified: Secondary | ICD-10-CM | POA: Diagnosis present

## 2022-10-23 DIAGNOSIS — R188 Other ascites: Secondary | ICD-10-CM | POA: Diagnosis present

## 2022-10-23 DIAGNOSIS — D84821 Immunodeficiency due to drugs: Secondary | ICD-10-CM | POA: Diagnosis present

## 2022-10-23 DIAGNOSIS — J449 Chronic obstructive pulmonary disease, unspecified: Secondary | ICD-10-CM | POA: Diagnosis present

## 2022-10-23 DIAGNOSIS — R55 Syncope and collapse: Secondary | ICD-10-CM | POA: Diagnosis present

## 2022-10-23 DIAGNOSIS — Z8249 Family history of ischemic heart disease and other diseases of the circulatory system: Secondary | ICD-10-CM

## 2022-10-23 DIAGNOSIS — N3281 Overactive bladder: Secondary | ICD-10-CM | POA: Diagnosis present

## 2022-10-23 DIAGNOSIS — G9341 Metabolic encephalopathy: Secondary | ICD-10-CM | POA: Diagnosis present

## 2022-10-23 DIAGNOSIS — Z560 Unemployment, unspecified: Secondary | ICD-10-CM

## 2022-10-23 DIAGNOSIS — E538 Deficiency of other specified B group vitamins: Secondary | ICD-10-CM | POA: Diagnosis present

## 2022-10-23 DIAGNOSIS — B370 Candidal stomatitis: Secondary | ICD-10-CM | POA: Insufficient documentation

## 2022-10-23 DIAGNOSIS — I10 Essential (primary) hypertension: Secondary | ICD-10-CM | POA: Diagnosis present

## 2022-10-23 DIAGNOSIS — M329 Systemic lupus erythematosus, unspecified: Secondary | ICD-10-CM | POA: Diagnosis present

## 2022-10-23 DIAGNOSIS — Z833 Family history of diabetes mellitus: Secondary | ICD-10-CM

## 2022-10-23 DIAGNOSIS — F39 Unspecified mood [affective] disorder: Secondary | ICD-10-CM | POA: Diagnosis present

## 2022-10-23 DIAGNOSIS — E662 Morbid (severe) obesity with alveolar hypoventilation: Secondary | ICD-10-CM | POA: Diagnosis present

## 2022-10-23 DIAGNOSIS — G8929 Other chronic pain: Secondary | ICD-10-CM | POA: Diagnosis present

## 2022-10-23 DIAGNOSIS — Z713 Dietary counseling and surveillance: Secondary | ICD-10-CM

## 2022-10-23 DIAGNOSIS — I5022 Chronic systolic (congestive) heart failure: Secondary | ICD-10-CM | POA: Diagnosis not present

## 2022-10-23 DIAGNOSIS — Z823 Family history of stroke: Secondary | ICD-10-CM

## 2022-10-23 DIAGNOSIS — K651 Peritoneal abscess: Secondary | ICD-10-CM | POA: Diagnosis present

## 2022-10-23 DIAGNOSIS — I5082 Biventricular heart failure: Secondary | ICD-10-CM | POA: Diagnosis present

## 2022-10-23 DIAGNOSIS — K567 Ileus, unspecified: Secondary | ICD-10-CM | POA: Insufficient documentation

## 2022-10-23 DIAGNOSIS — E1169 Type 2 diabetes mellitus with other specified complication: Secondary | ICD-10-CM | POA: Diagnosis present

## 2022-10-23 DIAGNOSIS — E1143 Type 2 diabetes mellitus with diabetic autonomic (poly)neuropathy: Secondary | ICD-10-CM | POA: Diagnosis present

## 2022-10-23 DIAGNOSIS — Z7960 Long term (current) use of unspecified immunomodulators and immunosuppressants: Secondary | ICD-10-CM

## 2022-10-23 LAB — CBC
HCT: 34.2 % — ABNORMAL LOW (ref 36.0–46.0)
Hemoglobin: 11.3 g/dL — ABNORMAL LOW (ref 12.0–15.0)
MCH: 29.1 pg (ref 26.0–34.0)
MCHC: 33 g/dL (ref 30.0–36.0)
MCV: 88.1 fL (ref 80.0–100.0)
Platelets: 305 10*3/uL (ref 150–400)
RBC: 3.88 MIL/uL (ref 3.87–5.11)
RDW: 13.5 % (ref 11.5–15.5)
WBC: 22.5 10*3/uL — ABNORMAL HIGH (ref 4.0–10.5)
nRBC: 0 % (ref 0.0–0.2)

## 2022-10-23 LAB — COMPREHENSIVE METABOLIC PANEL
ALT: 10 U/L (ref 0–44)
AST: 13 U/L — ABNORMAL LOW (ref 15–41)
Albumin: 2.9 g/dL — ABNORMAL LOW (ref 3.5–5.0)
Alkaline Phosphatase: 111 U/L (ref 38–126)
Anion gap: 13 (ref 5–15)
BUN: 7 mg/dL (ref 6–20)
CO2: 18 mmol/L — ABNORMAL LOW (ref 22–32)
Calcium: 9 mg/dL (ref 8.9–10.3)
Chloride: 104 mmol/L (ref 98–111)
Creatinine, Ser: 1.02 mg/dL — ABNORMAL HIGH (ref 0.44–1.00)
GFR, Estimated: >60 mL/min (ref >60)
Glucose, Bld: 202 mg/dL — ABNORMAL HIGH (ref 70–99)
Potassium: 3.7 mmol/L (ref 3.5–5.1)
Sodium: 135 mmol/L (ref 135–145)
Total Bilirubin: 0.9 mg/dL (ref 0.3–1.2)
Total Protein: 6.9 g/dL (ref 6.5–8.1)

## 2022-10-23 LAB — I-STAT CHEM 8, ED
BUN: 8 mg/dL (ref 6–20)
Calcium, Ion: 1.13 mmol/L — ABNORMAL LOW (ref 1.15–1.40)
Chloride: 106 mmol/L (ref 98–111)
Creatinine, Ser: 0.8 mg/dL (ref 0.44–1.00)
Glucose, Bld: 206 mg/dL — ABNORMAL HIGH (ref 70–99)
HCT: 36 % (ref 36.0–46.0)
Hemoglobin: 12.2 g/dL (ref 12.0–15.0)
Potassium: 3.7 mmol/L (ref 3.5–5.1)
Sodium: 137 mmol/L (ref 135–145)
TCO2: 18 mmol/L — ABNORMAL LOW (ref 22–32)

## 2022-10-23 LAB — URINALYSIS, ROUTINE W REFLEX MICROSCOPIC
Bacteria, UA: NONE SEEN
Bilirubin Urine: NEGATIVE
Glucose, UA: NEGATIVE mg/dL
Ketones, ur: 20 mg/dL — AB
Leukocytes,Ua: NEGATIVE
Nitrite: NEGATIVE
Protein, ur: 30 mg/dL — AB
Specific Gravity, Urine: >1.046 — ABNORMAL HIGH (ref 1.005–1.030)
pH: 6 (ref 5.0–8.0)

## 2022-10-23 LAB — RAPID URINE DRUG SCREEN, HOSP PERFORMED
Amphetamines: NOT DETECTED
Barbiturates: NOT DETECTED
Benzodiazepines: POSITIVE — AB
Cocaine: NOT DETECTED
Opiates: NOT DETECTED
Tetrahydrocannabinol: NOT DETECTED

## 2022-10-23 LAB — PROTIME-INR
INR: 1.2 (ref 0.8–1.2)
Prothrombin Time: 15.3 seconds — ABNORMAL HIGH (ref 11.4–15.2)

## 2022-10-23 LAB — DIFFERENTIAL
Abs Immature Granulocytes: 0.14 10*3/uL — ABNORMAL HIGH (ref 0.00–0.07)
Basophils Absolute: 0.1 10*3/uL (ref 0.0–0.1)
Basophils Relative: 0 %
Eosinophils Absolute: 0.1 10*3/uL (ref 0.0–0.5)
Eosinophils Relative: 0 %
Immature Granulocytes: 1 %
Lymphocytes Relative: 6 %
Lymphs Abs: 1.4 10*3/uL (ref 0.7–4.0)
Monocytes Absolute: 2 10*3/uL — ABNORMAL HIGH (ref 0.1–1.0)
Monocytes Relative: 9 %
Neutro Abs: 18.8 10*3/uL — ABNORMAL HIGH (ref 1.7–7.7)
Neutrophils Relative %: 84 %

## 2022-10-23 LAB — APTT: aPTT: 26 seconds (ref 24–36)

## 2022-10-23 LAB — I-STAT BETA HCG BLOOD, ED (MC, WL, AP ONLY): I-stat hCG, quantitative: <5 m[IU]/mL (ref <5)

## 2022-10-23 LAB — ETHANOL: Alcohol, Ethyl (B): <10 mg/dL (ref <10)

## 2022-10-23 MED ORDER — IOHEXOL 350 MG/ML SOLN
75.0000 mL | Freq: Once | INTRAVENOUS | Status: AC | PRN
  Administered 2022-10-23: 75 mL via INTRAVENOUS

## 2022-10-23 MED ORDER — ONDANSETRON HCL 4 MG/2ML IJ SOLN
4.0000 mg | Freq: Once | INTRAMUSCULAR | Status: AC
  Administered 2022-10-23: 4 mg via INTRAVENOUS
  Filled 2022-10-23: qty 2

## 2022-10-23 MED ORDER — FENTANYL CITRATE PF 50 MCG/ML IJ SOSY
50.0000 ug | PREFILLED_SYRINGE | Freq: Once | INTRAMUSCULAR | Status: AC
  Administered 2022-10-23: 50 ug via INTRAVENOUS
  Filled 2022-10-23: qty 1

## 2022-10-23 MED ORDER — PIPERACILLIN-TAZOBACTAM 3.375 G IVPB 30 MIN: 3.3750 g | Freq: Once | INTRAVENOUS | Status: DC

## 2022-10-23 NOTE — ED Notes (Signed)
Pt back in room at this time; more awake than prior.  Pt reports she has been nauseous with abdominal pain for about a week.  States she remembers throwing up all day today.

## 2022-10-23 NOTE — ED Triage Notes (Addendum)
Pt presents to ED via EMS from home as Code Stroke.  Per report, pt passed out while lowering self to floor.  Upon EMS arrival, pt noted to have right sided facial droop with eyes deviated to left.  LKN 2000.  Pt met at [facility] upon arrival by ED provider and stroke team.

## 2022-10-23 NOTE — Code Documentation (Signed)
Responded to Code Stroke called at 2048 for R sided facial droop and R arm weakness, LSN-2000. Pt arrived at 2102, CBG-202, NIH-23, CT head negative for acute changes, CTA-no LVO. TNK not given-pt on eliquis. Code Stroke cancelled at 2145. Plan metabolic w/o.

## 2022-10-23 NOTE — Consult Note (Signed)
Neurology Consultation Reason for Consult: decreased responsiveness Referring Physician: Robyn Haber  CC: Unresponsiveness  History is obtained from: Sister  HPI: [name] is a 52 y.o. female with a history of diabetes, hypertension, hyperlipidemia, CHF, diabetic gastroparesis Bell's palsy who presents with unresponsiveness.  She was complaining of abdominal pain, and saying that she did not feel well all day.  Around five, she felt lightheaded and dizzy.  Then around 8 PM, she was seen to have sudden loss of consciousness at which time her sister called 911.  She was brought into the emergency department as a code stroke and on arrival, she was obtunded.  She was taken emergently to CT/CTA which were negative.  She takes apixaban at baseline.  Of note, she has multiple previous evaluations for left-sided weakness in the setting of headache.  She has reported times in the past that she has a previous history of stroke, however she was diagnosed with nonorganic weakness in 2010.     LKW: 8 PM tnk given?: no, anticoagulated  Past Medical History:  Diagnosis Date   Acute renal failure (HCC)     Intractable nausea vomiting secondary to diabetic gastroparesis causing dehydration and acute renal failure /notes 04/01/2013   Anginal pain ([place])    Anxiety    Bell's palsy 08/09/2010   Bicornuate uterus    CAP (community acquired pneumonia) 10/2011   Archie Endo 11/08/2011   Chest pain 01/13/2014   CHF (congestive heart failure) ([place])    Diabetic gastroparesis associated with type 2 diabetes mellitus ([place])    this is presumed diagnoses, not confirmed by any studies.    Eczema    Family history of breast cancer    Family history of malignant neoplasm of breast    Family history of malignant neoplasm of ovary    Family history of ovarian cancer    Family history of prostate cancer    GERD (gastroesophageal reflux disease)    High cholesterol    Hypertension    Liver lesion 08/13/2019    Liver mass 2014   biopsied 03/2013 at [facility], not malignant.  is to undergo radiologic ablation of the mass in sept/October 2014.    Lupus ([place])    Migraines    "maybe a couple times/yr" (04/01/2013)   Obesity    Obstructive sleep apnea on CPAP 2011   Oxygen at nights   Pulmonary embolism ([place]) 05/21/2021   SJOGREN'S SYNDROME 08/09/2010   SLE (systemic lupus erythematosus) ([place])    Archie Endo 12/01/2010     Family History  Problem Relation Age of Onset   Diabetes Mother    Asthma Mother    Urticaria Mother    Hypertension Mother    Stroke Mother    Ovarian cancer Sister 56       maternal half-sister; RAD51C positive   Asthma Brother    Asthma Brother    Breast cancer Maternal Aunt 28       currently 13   Cancer Maternal Aunt        unk. primary   Cancer Maternal Aunt        unk. primary   Ovarian cancer Maternal Aunt        deceased 76s   Breast cancer Maternal Aunt        deceased 67s   Breast cancer Maternal Aunt    Stomach cancer Maternal Uncle        dx 59s; deceased   Prostate cancer Maternal Uncle  deceased 45s   Breast cancer Cousin        deceased 1s; daughter of mat uncle with stomach ca   Hypertension Other    Stroke Other    Arthritis Other    Allergic rhinitis Neg Hx    Angioedema Neg Hx      Social History:  reports that she quit smoking about 31 years ago. Her smoking use included cigarettes. She has a 1.50 pack-year smoking history. She has never used smokeless tobacco. She reports that she does not drink alcohol and does not use drugs.   Exam: Current vital signs: Wt (!) 153.2 kg   LMP 03/24/2017   BMI 51.35 kg/m  Vital signs in last 24 hours: Weight:  [153.2 kg] 153.2 kg (03/24 2100)   Physical Exam  Appears well-developed and well-nourished.   Neuro: Mental Status: Patient is obtunded, she initially does not follow any commands, though by the end of my evaluation she is speaking normally. Cranial Nerves: II: Visual  Fields are full. Pupils are slightly unequal, but both are reactive. III,IV, VI: When unresponsive, her right eye is outward compared to her left, but with increasing level of consciousness she conjugate them. V:VII: She has right facial weakness which is old IX: Gag is present Motor: Initially, she moves all extremities spontaneously, and symmetrically, but did not lift her legs against gravity, and would let her arms drift back to bed. Sensory: She does respond to neck stimulation x 4  Cerebellar: Does not perform     I have reviewed labs in epic and the results pertinent to this consultation are: VBG pCO2 35  I have reviewed the images obtained: CT/CTA-negative  Impression: 52 year old female with syncope in the setting of pelvic infection.  The fact that she was confused for still and afterwards does raise the possibility of seizure, and an EEG would be reasonable but I would not start medications empirically based on the semiology.  If she has focal deficits after she continues to wake up, could consider MRI but I think this to be low yield at this time.  Recommendations: 1) EEG 2) ammonia, TSH 3) could consider MRI 4) neurology will follow   Roland Rack, MD [facility] [phone]  If 7pm- 7am, please page neurology on call as listed in [place].

## 2022-10-23 NOTE — ED Notes (Signed)
Pt assisted with bedpan for urinary needs.

## 2022-10-23 NOTE — ED Provider Notes (Addendum)
[place] Provider Note   CSN: [id number] Arrival date & time: 10/23/22  2102  An emergency department physician performed an initial assessment on this suspected stroke patient at 2102.  History  Chief Complaint  Patient presents with   Code Stroke    [name] is a 52 y.o. female.  HPI Patient presented as a code stroke.  Reportedly passed out earlier in the day.  Had had some abdominal pain and nausea and vomiting.  Reportedly woke up at 8 PM with mental status changes.  Reportedly facial droop and not moving her right side.  Difficulty speaking.  More mental status change.  Complete NIH scoring done by neurology and met [place] by myself and Dr. Leonel Ramsay.   Past Medical History:  Diagnosis Date   Acute renal failure (HCC)     Intractable nausea vomiting secondary to diabetic gastroparesis causing dehydration and acute renal failure /notes 04/01/2013   Anginal pain ([place])    Anxiety    Bell's palsy 08/09/2010   Bicornuate uterus    CAP (community acquired pneumonia) 10/2011   Archie Endo 11/08/2011   Chest pain 01/13/2014   CHF (congestive heart failure) ([place])    Diabetic gastroparesis associated with type 2 diabetes mellitus ([place])    this is presumed diagnoses, not confirmed by any studies.    Eczema    Family history of breast cancer    Family history of malignant neoplasm of breast    Family history of malignant neoplasm of ovary    Family history of ovarian cancer    Family history of prostate cancer    GERD (gastroesophageal reflux disease)    High cholesterol    Hypertension    Liver lesion 08/13/2019   Liver mass 2014   biopsied 03/2013 at [facility], not malignant.  is to undergo radiologic ablation of the mass in sept/October 2014.    Lupus ([place])    Migraines    "maybe a couple times/yr" (04/01/2013)   Obesity    Obstructive sleep apnea on CPAP 2011   Oxygen at nights   Pulmonary embolism ([place])  05/21/2021   SJOGREN'S SYNDROME 08/09/2010   SLE (systemic lupus erythematosus) ([place])    Archie Endo 12/01/2010    Home Medications Prior to Admission medications   Medication Sig Start Date End Date Taking? Authorizing Provider  ADVAIR HFA 230-21 MCG/ACT inhaler Inhale 2 puffs into the lungs 2 (two) times daily. 12/20/20   [provider]  albuterol (PROVENTIL) (2.5 MG/3ML) 0.083% nebulizer solution Take 2.5 mg by nebulization as needed for wheezing.    [provider]  ALPRAZolam Duanne Moron) 0.5 MG tablet Take 0.5 mg by mouth as needed for anxiety or sleep. 04/19/17   [provider]  apixaban (ELIQUIS) 2.5 MG TABS tablet Take 1 tablet (2.5 mg total) by mouth 2 (two) times daily. 09/21/22   Adrian Prows, MD  ARIPiprazole (ABILIFY) 5 MG tablet  01/14/21   [provider]  Azathioprine 75 MG TABS Take 150 mg by mouth 2 (two) times daily. 02/04/21   [provider]  B-D UF III MINI PEN NEEDLES 31G X 5 MM MISC USE AS DIRECTED TWICE A DAY 10/31/18   Renato Shin, MD  Belimumab (BENLYSTA) 200 MG/ML SOAJ Inject 200 mg into the skin once a week. Thursdays 12/19/19   [provider]  buPROPion (WELLBUTRIN XL) 300 MG 24 hr tablet Take 300 mg by mouth daily. 03/14/22   [provider]  carvedilol (COREG) 25 MG tablet Take 1.5 tablets (37.5 mg total) by mouth 2 (two) times daily with a meal. 01/01/21   Adrian Prows, MD  Continuous Blood Gluc Sensor (FREESTYLE LIBRE 3 SENSOR) MISC 1 Device by Does not apply route every 14 (fourteen) days. Apply 1 sensor on upper arm every 14 days for continuous glucose monitoring 05/25/22   Elayne Snare, MD  cromolyn (OPTICROM) 4 % ophthalmic solution Place 1 drop into both eyes 2 (two) times daily. 01/14/21   [provider]  cycloSPORINE (RESTASIS) 0.05 % ophthalmic emulsion Place 1 drop into both eyes in the morning, at noon, and at bedtime.     [provider]  diclofenac Sodium (VOLTAREN) 1 % GEL APPLY 2 GRAMS TO  AFFECTED AREA 4 TIMES A DAY Patient taking differently: Apply 2 g topically 4 (four) times daily as needed (pain). 01/11/22   Cantwell, Celeste C, PA-C  EASY TOUCH PEN NEEDLES 32G X 4 MM MISC USE TO INJECT INSULIN TWICE DAILY 08/07/19   Renato Shin, MD  famotidine (PEPCID) 20 MG tablet Take 20 mg by mouth 2 (two) times daily.    [provider]  fluticasone (FLONASE) 50 MCG/ACT nasal spray Place 2 sprays into both nostrils continuous as needed for allergies or rhinitis.    [provider]  glucose blood (ACCU-CHEK GUIDE) test strip 1 each by Other route 2 (two) times daily. And lancets 2/day 06/18/21   Renato Shin, MD  glucose blood [facility] ULTRA) test strip Test blood sugar 4 times a day 03/30/22   Elayne Snare, MD  hydroxychloroquine (PLAQUENIL) 200 MG tablet Take 200 mg by mouth 2 (two) times daily.    [provider]  ibuprofen (ADVIL) 600 MG tablet Take 1 tablet (600 mg total) by mouth every 6 (six) hours as needed. Patient taking differently: Take 600 mg by mouth every 6 (six) hours as needed for mild pain. 10/14/22   Montine Circle, PA-C  insulin regular human CONCENTRATED (HUMULIN R U-500 KWIKPEN) 500 UNIT/ML KwikPen 100 UNITS, 30 MINUTES BEFORE EACH MEAL Patient taking differently: 100 Units 3 (three) times daily with meals. 100 Units, 30 minutes before each meal 08/16/22   Elayne Snare, MD  Insulin Syringe-Needle U-100 (EASY TOUCH INSULIN SYRINGE) 31G X 5/16" 1 ML MISC USE AS DIRECTED THREE TIMES A DAY 10/12/18   Renato Shin, MD  JARDIANCE 10 MG TABS tablet TAKE 1 TABLET (10 MG TOTAL) BY MOUTH DAILY WITH BREAKFAST. 08/18/22   Elayne Snare, MD  Lancets [facility] ULTRASOFT) lancets Test blood sugar 4 times a day 03/30/22   Elayne Snare, MD  levothyroxine (SYNTHROID) 50 MCG tablet Take 1 tablet (50 mcg total) by mouth daily at 6 (six) AM. 08/30/19   Raiford Noble Latif, DO  megestrol (MEGACE) 40 MG tablet Take 40 mg by mouth daily. 01/30/15   [provider]   metFORMIN (GLUCOPHAGE-XR) 500 MG 24 hr tablet Take 2 tablets (1,000 mg total) by mouth 2 (two) times daily. 02/02/21   Renato Shin, MD  Multiple Vitamin (MULTIVITAMIN WITH MINERALS) TABS tablet Take 1 tablet by mouth daily. 08/30/19   Raiford Noble Latif, DO  ondansetron (ZOFRAN-ODT) 4 MG disintegrating tablet Take 1 tablet (4 mg total) by mouth every 8 (eight) hours as needed for nausea or vomiting. 10/14/22   Montine Circle, PA-C  oxybutynin (DITROPAN-XL) 5 MG 24 hr tablet Take 5 mg by mouth daily. 04/25/22   [provider]  pregabalin (LYRICA) 75 MG capsule Take 75 mg 3 (three) times  daily by mouth.  01/28/15   [provider]  prochlorperazine (COMPAZINE) 10 MG tablet Take 1 tablet (10 mg total) by mouth every 6 (six) hours as needed for nausea or vomiting. 05/14/22   Bonnielee Haff, MD  promethazine (PHENERGAN) 25 MG suppository Place 1 suppository (25 mg total) rectally every 6 (six) hours as needed for nausea or vomiting. 10/21/22   Clark, Meghan R, PA  QUEtiapine (SEROQUEL) 300 MG tablet Take 300 mg by mouth at bedtime. 01/08/21   [provider]  rosuvastatin (CRESTOR) 20 MG tablet Take 1 tablet (20 mg total) by mouth daily. 06/09/21 10/16/22  Cantwell, Celeste C, PA-C  Semaglutide, 2 MG/DOSE, 8 MG/3ML SOPN Inject 2 mg as directed once a week. 08/18/22   Elayne Snare, MD  sucralfate (CARAFATE) 1 GM/10ML suspension Take 10 mLs by mouth 2 (two) times daily as needed (abdomen pain). 12/09/20   [provider]  topiramate (TOPAMAX) 25 MG tablet Take 75 mg by mouth at bedtime. 03/17/16   [provider]  torsemide (DEMADEX) 20 MG tablet Take 1 tablet (20 mg total) by mouth at bedtime. 06/09/21   Cantwell, Celeste C, PA-C  TRANSDERM-SCOP 1 MG/3DAYS Place 1 patch (1.5 mg total) onto the skin every 3 (three) days. As needed for nausea 10/21/22   Theressa Stamps R, PA  Vitamin D, Ergocalciferol, (DRISDOL) 1.25 MG (50000 UNIT) CAPS capsule Take 50,000 Units by mouth  once a week. 09/20/22   [provider]      Allergies    Metoclopramide, Codeine, Hydrocodone, Penicillins, and Percocet [oxycodone-acetaminophen]    Review of Systems   Review of Systems  Physical Exam Updated Vital Signs BP (!) 156/84   Pulse 100   Resp 15   Wt (!) 153.2 kg   LMP 03/24/2017   SpO2 100%   BMI 51.35 kg/m  Physical Exam Vitals and nursing note reviewed.  Constitutional:      Appearance: She is obese.  Cardiovascular:     Rate and Rhythm: Regular rhythm.  Pulmonary:     Effort: Pulmonary effort is normal.  Abdominal:     Tenderness: There is abdominal tenderness.     Comments: Moderate lower abdominal tenderness.  Musculoskeletal:        General: No tenderness.  Skin:    General: Skin is warm.  Neurological:     Comments: Decreased mental status.  Right-sided facial droop.  Decreased level of consciousness.  Mostly nonverbal.     ED Results / Procedures / Treatments   Labs (all labs ordered are listed, but only abnormal results are displayed) Labs Reviewed  PROTIME-INR - Abnormal; Notable for the following components:      Result Value   Prothrombin Time 15.3 (*)    All other components within normal limits  CBC - Abnormal; Notable for the following components:   WBC 22.5 (*)    Hemoglobin 11.3 (*)    HCT 34.2 (*)    All other components within normal limits  DIFFERENTIAL - Abnormal; Notable for the following components:   Neutro Abs 18.8 (*)    Monocytes Absolute 2.0 (*)    Abs Immature Granulocytes 0.14 (*)    All other components within normal limits  COMPREHENSIVE METABOLIC PANEL - Abnormal; Notable for the following components:   CO2 18 (*)    Glucose, Bld 202 (*)    Creatinine, Ser 1.02 (*)    Albumin 2.9 (*)    AST 13 (*)    All other components within  normal limits  RAPID URINE DRUG SCREEN, HOSP PERFORMED - Abnormal; Notable for the following components:   Benzodiazepines POSITIVE (*)    All other components within  normal limits  URINALYSIS, ROUTINE W REFLEX MICROSCOPIC - Abnormal; Notable for the following components:   Specific Gravity, Urine >1.046 (*)    Hgb urine dipstick SMALL (*)    Ketones, ur 20 (*)    Protein, ur 30 (*)    All other components within normal limits  I-STAT CHEM 8, ED - Abnormal; Notable for the following components:   Glucose, Bld 206 (*)    Calcium, Ion 1.13 (*)    TCO2 18 (*)    All other components within normal limits  WET PREP, GENITAL  ETHANOL  APTT  RPR  HIV ANTIBODY (ROUTINE TESTING W REFLEX)  I-STAT BETA HCG BLOOD, ED ([facility], [facility], [facility] ONLY)  GC/CHLAMYDIA PROBE AMP ([place]) NOT AT [facility]    EKG None  Radiology CT ABDOMEN PELVIS W CONTRAST  Result Date: 10/23/2022 CLINICAL DATA:  Abdominal pain. EXAM: CT ABDOMEN AND PELVIS WITH CONTRAST TECHNIQUE: Multidetector CT imaging of the abdomen and pelvis was performed using the standard protocol following bolus administration of intravenous contrast. RADIATION DOSE REDUCTION: This exam was performed according to the departmental dose-optimization program which includes automated exposure control, adjustment of the mA and/or kV according to patient size and/or use of iterative reconstruction technique. CONTRAST:  8mL OMNIPAQUE IOHEXOL 350 MG/ML SOLN COMPARISON:  CT abdomen pelvis dated 10/14/2022. FINDINGS: Lower chest: Minimal bibasilar atelectasis. The visualized lung bases are otherwise clear. No intra-abdominal free air.  Trace free fluid in the pelvis. Hepatobiliary: The liver is unremarkable. No biliary dilatation. The gallbladder is unremarkable Pancreas: Unremarkable. No pancreatic ductal dilatation or surrounding inflammatory changes. Spleen: Normal in size without focal abnormality. Adrenals/Urinary Tract: The adrenal glands are unremarkable there is no hydronephrosis on either side. There is symmetric excretion of contrast by both kidneys. The visualized ureters appear unremarkable. The urinary bladder is collapsed.  Stomach/Bowel: There is a small hiatal hernia. There is no bowel obstruction or active inflammation. The appendix is normal. Vascular/Lymphatic: The abdominal aorta and IVC are unremarkable. No portal venous gas. There is no adenopathy. Reproductive: The uterus appears anteverted with poorly visualized. There is diffuse inflammatory changes and stranding of the pelvic fat, new since the prior CT. There is a partially organized low attenuating collection in the left hemipelvis measuring approximately 5 x 5 cm in greatest axial dimensions concerning for developing abscess. Small amount of high density fluid may represent complex fluid or blood product. Findings may represent pelvic inflammatory disease with developing abscesses. Clinical correlation and further evaluation with pelvic ultrasound recommended. Other: None Musculoskeletal: No acute or significant osseous findings. IMPRESSION: 1. Diffuse inflammatory changes and stranding of the pelvic fat, new since the prior CT. Findings may represent an inflammatory/infectious process such as PID with developing abscess. A small amount of hemorrhagic content may also be present in the pelvis and therefore the possibility of a ruptured hemorrhagic cyst, or trauma is not excluded. No drainable fluid collection/abscess at this time. Clinical correlation and further evaluation with pelvic ultrasound recommended. 2. No bowel obstruction. Normal appendix. Electronically Signed   By: Anner Crete M.D.   On: 10/23/2022 22:08   CT ANGIO HEAD NECK W WO CM (CODE STROKE)  Result Date: 10/23/2022 CLINICAL DATA:  Initial evaluation for stroke. EXAM: CT ANGIOGRAPHY HEAD AND NECK TECHNIQUE: Multidetector CT imaging of the head and neck was performed using the  standard protocol during bolus administration of intravenous contrast. Multiplanar CT image reconstructions and MIPs were obtained to evaluate the vascular anatomy. Carotid stenosis measurements (when applicable) are  obtained utilizing NASCET criteria, using the distal internal carotid diameter as the denominator. RADIATION DOSE REDUCTION: This exam was performed according to the departmental dose-optimization program which includes automated exposure control, adjustment of the mA and/or kV according to patient size and/or use of iterative reconstruction technique. CONTRAST:  80mL OMNIPAQUE IOHEXOL 350 MG/ML SOLN COMPARISON:  CT from earlier the same day as well as prior exam from 02/18/2021. FINDINGS: CTA NECK FINDINGS Aortic arch: Visualized aortic arch normal caliber with standard branch pattern. No stenosis about the origin the great vessels. Right carotid system: Right common and internal carotid arteries widely patent without stenosis, dissection or occlusion. Left carotid system: Left common and internal carotid arteries widely patent without stenosis, dissection or occlusion. Vertebral arteries: Both vertebral arteries arise from subclavian arteries. No proximal subclavian artery stenosis. Vertebral arteries are markedly hypoplastic but patent without stenosis or dissection. Skeleton: No discrete or worrisome osseous lesions. Moderate spondylosis present at C4-5 through C6-7. Other neck: No other acute soft tissue abnormality within the neck. Few punctate sialoliths noted within the left parotid gland. Upper chest: Visualized upper chest demonstrates no acute finding. Review of the MIP images confirms the above findings CTA HEAD FINDINGS Anterior circulation: Both internal carotid arteries are patent to the termini without stenosis. Persistent trigeminal artery noted on the right. A1 segments, anterior communicating complex common anterior cerebral arteries patent without stenosis. No M1 stenosis or occlusion. No proximal MCA branch occlusion or high-grade stenosis. Distal MCA branches perfused and symmetric. Posterior circulation: Both V4 segments patent without stenosis. Both PICA patent. Proximal basilar artery is  diminutive but patent. Basilar more normal in caliber distal to the persistent right trigeminal artery. Superior cerebellar and posterior cerebral arteries patent bilaterally. Venous sinuses: Patent allowing for timing the contrast bolus. Anatomic variants: Persistent trigeminal artery with diminutive vertebrobasilar system. Review of the MIP images confirms the above findings IMPRESSION: 1. Negative CTA of the head and neck. No large vessel occlusion or other emergent finding. 2. 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 These results were communicated to Dr. Leonel Ramsay at 9:59 pm on 10/23/2022 by text page via the [facility] messaging system. Electronically Signed   By: Jeannine Boga M.D.   On: 10/23/2022 22:02   CT HEAD CODE STROKE WO CONTRAST  Result Date: 10/23/2022 CLINICAL DATA:  Code stroke. EXAM: CT HEAD WITHOUT CONTRAST TECHNIQUE: Contiguous axial images were obtained from the base of the skull through the vertex without intravenous contrast. RADIATION DOSE REDUCTION: This exam was performed according to the departmental dose-optimization program which includes automated exposure control, adjustment of the mA and/or kV according to patient size and/or use of iterative reconstruction technique. COMPARISON:  None Available. FINDINGS: Brain: Cerebral volume within normal limits. No acute intracranial hemorrhage. No acute large vessel territory infarct. No mass lesion, midline shift or mass effect. No hydrocephalus or extra-axial fluid collection. Vascular: No abnormal hyperdense vessel. Skull: Scalp soft tissues and calvarium within normal limits. Sinuses/Orbits: Left a spur [place] noted. Few small retention cyst noted within the left maxillary sinus. Mild mucosal thickening noted about the ethmoidal air cells. No mastoid effusion. Other: None. ASPECTS [facility] Stroke Program Early CT Score) - Ganglionic level infarction (caudate, lentiform nuclei,  internal capsule, insula, M1-M3 cortex): 7 - Supraganglionic infarction (M4-M6 cortex): 3 Total score (0-10 with 10 being normal): 10 IMPRESSION:  1. No acute intracranial abnormality. 2. ASPECTS is 10. These results were communicated to Dr. Leonel Ramsay at 9:17 pm on 10/23/2022 by text page via the [facility] messaging system. Electronically Signed   By: Jeannine Boga M.D.   On: 10/23/2022 21:18    Procedures Procedures    Medications Ordered in ED Medications  iohexol (OMNIPAQUE) 350 MG/ML injection 75 mL (75 mLs Intravenous Contrast Given 10/23/22 2138)  iohexol (OMNIPAQUE) 350 MG/ML injection 75 mL (75 mLs Intravenous Contrast Given 10/23/22 2146)  ondansetron (ZOFRAN) injection 4 mg (4 mg Intravenous Given 10/23/22 2233)  fentaNYL (SUBLIMAZE) injection 50 mcg (50 mcg Intravenous Given 10/23/22 2253)    ED Course/ Medical Decision Making/ A&P                             Medical Decision Making Amount and/or Complexity of Data Reviewed Labs: ordered. Radiology: ordered.  Risk Prescription drug management. Decision regarding hospitalization.   Patient came in as a code stroke.  Mental status change.  Has had some functional neurologic issues in the past.  Head CT and CTA reassuring.  Mental status improving.  Not a TNK candidate for several different reasons with improving symptoms and already on anticoagulation.  However has had abdominal pain.  Has had nausea and vomiting.  Reviewing notes has had recent workup for the same without clear cause although thought to be potentially related to diabetic gastroparesis or cyclic vomiting.  However CT scan done for the abdomen due to elevated white count and found to have pelvic inflammation potentially abscess.  Pelvic exam done with mild discharge but did have pain with cervical movement.  Ultrasound to be done.  I think likely require admission in the hospital.  Care turned over to Dr. Dina Rich  CRITICAL CARE Performed by: Davonna Belling Total critical care time: 30 well at minutes Critical care time was exclusive of separately billable procedures and treating other patients. Critical care was necessary to treat or prevent imminent or life-threatening deterioration. Critical care was time spent personally by me on the following activities: development of treatment plan with patient and/or surrogate as well as nursing, discussions with consultants, evaluation of patient's response to treatment, examination of patient, obtaining history from patient or surrogate, ordering and performing treatments and interventions, ordering and review of laboratory studies, ordering and review of radiographic studies, pulse oximetry and re-evaluation of patient's condition.         Final Clinical Impression(s) / ED Diagnoses Final diagnoses:  Pelvic pain  Encephalopathy    Rx / DC Orders ED Discharge Orders     None         Davonna Belling, MD 10/23/22 2325    Davonna Belling, MD 10/23/22 2350

## 2022-10-24 ENCOUNTER — Observation Stay (HOSPITAL_COMMUNITY): Payer: Medicaid Other

## 2022-10-24 ENCOUNTER — Encounter (HOSPITAL_COMMUNITY): Payer: Self-pay | Admitting: Family Medicine

## 2022-10-24 DIAGNOSIS — K567 Ileus, unspecified: Secondary | ICD-10-CM | POA: Diagnosis not present

## 2022-10-24 DIAGNOSIS — E039 Hypothyroidism, unspecified: Secondary | ICD-10-CM | POA: Diagnosis present

## 2022-10-24 DIAGNOSIS — K56609 Unspecified intestinal obstruction, unspecified as to partial versus complete obstruction: Secondary | ICD-10-CM | POA: Diagnosis not present

## 2022-10-24 DIAGNOSIS — R188 Other ascites: Secondary | ICD-10-CM | POA: Diagnosis present

## 2022-10-24 DIAGNOSIS — G2581 Restless legs syndrome: Secondary | ICD-10-CM | POA: Diagnosis present

## 2022-10-24 DIAGNOSIS — K56600 Partial intestinal obstruction, unspecified as to cause: Secondary | ICD-10-CM | POA: Diagnosis not present

## 2022-10-24 DIAGNOSIS — J449 Chronic obstructive pulmonary disease, unspecified: Secondary | ICD-10-CM | POA: Diagnosis present

## 2022-10-24 DIAGNOSIS — E1143 Type 2 diabetes mellitus with diabetic autonomic (poly)neuropathy: Secondary | ICD-10-CM | POA: Diagnosis present

## 2022-10-24 DIAGNOSIS — R4182 Altered mental status, unspecified: Secondary | ICD-10-CM | POA: Diagnosis not present

## 2022-10-24 DIAGNOSIS — R55 Syncope and collapse: Secondary | ICD-10-CM

## 2022-10-24 DIAGNOSIS — J9811 Atelectasis: Secondary | ICD-10-CM | POA: Diagnosis not present

## 2022-10-24 DIAGNOSIS — N739 Female pelvic inflammatory disease, unspecified: Secondary | ICD-10-CM | POA: Diagnosis not present

## 2022-10-24 DIAGNOSIS — Z8673 Personal history of transient ischemic attack (TIA), and cerebral infarction without residual deficits: Secondary | ICD-10-CM | POA: Diagnosis not present

## 2022-10-24 DIAGNOSIS — M3500 Sicca syndrome, unspecified: Secondary | ICD-10-CM | POA: Diagnosis not present

## 2022-10-24 DIAGNOSIS — A419 Sepsis, unspecified organism: Secondary | ICD-10-CM | POA: Diagnosis present

## 2022-10-24 DIAGNOSIS — F324 Major depressive disorder, single episode, in partial remission: Secondary | ICD-10-CM | POA: Diagnosis present

## 2022-10-24 DIAGNOSIS — G9340 Encephalopathy, unspecified: Secondary | ICD-10-CM | POA: Diagnosis present

## 2022-10-24 DIAGNOSIS — Z6841 Body Mass Index (BMI) 40.0 and over, adult: Secondary | ICD-10-CM | POA: Diagnosis not present

## 2022-10-24 DIAGNOSIS — D72829 Elevated white blood cell count, unspecified: Secondary | ICD-10-CM | POA: Diagnosis not present

## 2022-10-24 DIAGNOSIS — G9341 Metabolic encephalopathy: Secondary | ICD-10-CM | POA: Diagnosis present

## 2022-10-24 DIAGNOSIS — K651 Peritoneal abscess: Secondary | ICD-10-CM | POA: Diagnosis present

## 2022-10-24 DIAGNOSIS — N321 Vesicointestinal fistula: Secondary | ICD-10-CM | POA: Diagnosis present

## 2022-10-24 DIAGNOSIS — I5022 Chronic systolic (congestive) heart failure: Secondary | ICD-10-CM | POA: Diagnosis not present

## 2022-10-24 DIAGNOSIS — N179 Acute kidney failure, unspecified: Secondary | ICD-10-CM | POA: Diagnosis present

## 2022-10-24 DIAGNOSIS — I152 Hypertension secondary to endocrine disorders: Secondary | ICD-10-CM | POA: Diagnosis present

## 2022-10-24 DIAGNOSIS — M329 Systemic lupus erythematosus, unspecified: Secondary | ICD-10-CM | POA: Diagnosis present

## 2022-10-24 DIAGNOSIS — D84821 Immunodeficiency due to drugs: Secondary | ICD-10-CM | POA: Diagnosis present

## 2022-10-24 DIAGNOSIS — N730 Acute parametritis and pelvic cellulitis: Secondary | ICD-10-CM | POA: Diagnosis present

## 2022-10-24 DIAGNOSIS — I5042 Chronic combined systolic (congestive) and diastolic (congestive) heart failure: Secondary | ICD-10-CM | POA: Diagnosis present

## 2022-10-24 DIAGNOSIS — K5939 Other megacolon: Secondary | ICD-10-CM | POA: Diagnosis present

## 2022-10-24 DIAGNOSIS — B370 Candidal stomatitis: Secondary | ICD-10-CM | POA: Diagnosis present

## 2022-10-24 DIAGNOSIS — E1159 Type 2 diabetes mellitus with other circulatory complications: Secondary | ICD-10-CM | POA: Diagnosis not present

## 2022-10-24 DIAGNOSIS — R9431 Abnormal electrocardiogram [ECG] [EKG]: Secondary | ICD-10-CM | POA: Diagnosis not present

## 2022-10-24 DIAGNOSIS — E662 Morbid (severe) obesity with alveolar hypoventilation: Secondary | ICD-10-CM | POA: Diagnosis present

## 2022-10-24 DIAGNOSIS — D649 Anemia, unspecified: Secondary | ICD-10-CM | POA: Diagnosis present

## 2022-10-24 LAB — BASIC METABOLIC PANEL
Anion gap: 11 (ref 5–15)
BUN: 9 mg/dL (ref 6–20)
CO2: 17 mmol/L — ABNORMAL LOW (ref 22–32)
Calcium: 8.6 mg/dL — ABNORMAL LOW (ref 8.9–10.3)
Chloride: 106 mmol/L (ref 98–111)
Creatinine, Ser: 0.97 mg/dL (ref 0.44–1.00)
GFR, Estimated: >60 mL/min (ref >60)
Glucose, Bld: 196 mg/dL — ABNORMAL HIGH (ref 70–99)
Potassium: 3.7 mmol/L (ref 3.5–5.1)
Sodium: 134 mmol/L — ABNORMAL LOW (ref 135–145)

## 2022-10-24 LAB — ECHOCARDIOGRAM COMPLETE
AR max vel: 1.97 cm2
AV Area VTI: 2.08 cm2
AV Area mean vel: 1.72 cm2
AV Mean grad: 5 mmHg
AV Peak grad: 8.5 mmHg
Ao pk vel: 1.46 m/s
Area-P 1/2: 4.12 cm2
MV VTI: 3.12 cm2
S' Lateral: 3.3 cm
Weight: 5432.13 oz

## 2022-10-24 LAB — GLUCOSE, CAPILLARY
Glucose-Capillary: 183 mg/dL — ABNORMAL HIGH (ref 70–99)
Glucose-Capillary: 184 mg/dL — ABNORMAL HIGH (ref 70–99)
Glucose-Capillary: 186 mg/dL — ABNORMAL HIGH (ref 70–99)
Glucose-Capillary: 198 mg/dL — ABNORMAL HIGH (ref 70–99)
Glucose-Capillary: 201 mg/dL — ABNORMAL HIGH (ref 70–99)
Glucose-Capillary: 203 mg/dL — ABNORMAL HIGH (ref 70–99)
Glucose-Capillary: 335 mg/dL — ABNORMAL HIGH (ref 70–99)

## 2022-10-24 LAB — CBC
HCT: 32.2 % — ABNORMAL LOW (ref 36.0–46.0)
Hemoglobin: 10.7 g/dL — ABNORMAL LOW (ref 12.0–15.0)
MCH: 28.8 pg (ref 26.0–34.0)
MCHC: 33.2 g/dL (ref 30.0–36.0)
MCV: 86.8 fL (ref 80.0–100.0)
Platelets: 322 10*3/uL (ref 150–400)
RBC: 3.71 MIL/uL — ABNORMAL LOW (ref 3.87–5.11)
RDW: 13.5 % (ref 11.5–15.5)
WBC: 31.1 10*3/uL — ABNORMAL HIGH (ref 4.0–10.5)
nRBC: 0 % (ref 0.0–0.2)

## 2022-10-24 LAB — BLOOD GAS, VENOUS
Acid-base deficit: 5.6 mmol/L — ABNORMAL HIGH (ref 0.0–2.0)
Bicarbonate: 19.3 mmol/L — ABNORMAL LOW (ref 20.0–28.0)
O2 Saturation: 63.2 %
Patient temperature: 37
pCO2, Ven: 35 mmHg — ABNORMAL LOW (ref 44–60)
pH, Ven: 7.35 (ref 7.25–7.43)
pO2, Ven: 36 mmHg (ref 32–45)

## 2022-10-24 LAB — GC/CHLAMYDIA PROBE AMP (~~LOC~~) NOT AT ARMC
Chlamydia: NEGATIVE
Comment: NEGATIVE
Comment: NORMAL
Neisseria Gonorrhea: NEGATIVE

## 2022-10-24 LAB — WET PREP, GENITAL
Clue Cells Wet Prep HPF POC: NONE SEEN
Sperm: NONE SEEN
Trich, Wet Prep: NONE SEEN
WBC, Wet Prep HPF POC: <10 (ref <10)
Yeast Wet Prep HPF POC: NONE SEEN

## 2022-10-24 LAB — C-REACTIVE PROTEIN: CRP: 23.1 mg/dL — ABNORMAL HIGH (ref <1.0)

## 2022-10-24 LAB — HIV ANTIBODY (ROUTINE TESTING W REFLEX): HIV Screen 4th Generation wRfx: NONREACTIVE

## 2022-10-24 LAB — RPR: RPR Ser Ql: NONREACTIVE

## 2022-10-24 LAB — SEDIMENTATION RATE: Sed Rate: 83 mm/hr — ABNORMAL HIGH (ref 0–22)

## 2022-10-24 LAB — TSH: TSH: 1.28 u[IU]/mL (ref 0.350–4.500)

## 2022-10-24 LAB — MAGNESIUM: Magnesium: 1.7 mg/dL (ref 1.7–2.4)

## 2022-10-24 LAB — PROCALCITONIN: Procalcitonin: 0.18 ng/mL

## 2022-10-24 LAB — PHOSPHORUS: Phosphorus: 4.3 mg/dL (ref 2.5–4.6)

## 2022-10-24 LAB — AMMONIA: Ammonia: 20 umol/L (ref 9–35)

## 2022-10-24 MED ORDER — HYDROMORPHONE HCL 1 MG/ML IJ SOLN
0.5000 mg | INTRAMUSCULAR | Status: DC | PRN
  Administered 2022-10-24 – 2022-10-26 (×9): 0.5 mg via INTRAVENOUS
  Filled 2022-10-24 (×6): qty 0.5
  Filled 2022-10-24: qty 1
  Filled 2022-10-24 (×2): qty 0.5

## 2022-10-24 MED ORDER — ROSUVASTATIN CALCIUM 20 MG PO TABS
20.0000 mg | ORAL_TABLET | Freq: Every day | ORAL | Status: DC
  Administered 2022-10-24: 20 mg via ORAL
  Filled 2022-10-24 (×2): qty 1

## 2022-10-24 MED ORDER — PERFLUTREN LIPID MICROSPHERE
1.0000 mL | INTRAVENOUS | Status: AC | PRN
  Administered 2022-10-24: 8 mL via INTRAVENOUS

## 2022-10-24 MED ORDER — METRONIDAZOLE 500 MG/100ML IV SOLN
500.0000 mg | Freq: Two times a day (BID) | INTRAVENOUS | Status: DC
  Administered 2022-10-24 – 2022-10-25 (×3): 500 mg via INTRAVENOUS
  Filled 2022-10-24 (×4): qty 100

## 2022-10-24 MED ORDER — TORSEMIDE 20 MG PO TABS
20.0000 mg | ORAL_TABLET | Freq: Every day | ORAL | Status: DC
  Administered 2022-10-24: 20 mg via ORAL
  Filled 2022-10-24 (×2): qty 1

## 2022-10-24 MED ORDER — POTASSIUM CHLORIDE CRYS ER 20 MEQ PO TBCR
20.0000 meq | EXTENDED_RELEASE_TABLET | Freq: Once | ORAL | Status: AC
  Administered 2022-10-24: 20 meq via ORAL
  Filled 2022-10-24 (×2): qty 1

## 2022-10-24 MED ORDER — SODIUM CHLORIDE 0.9 % IV SOLN
2.0000 g | Freq: Every day | INTRAVENOUS | Status: DC
  Administered 2022-10-24 (×2): 2 g via INTRAVENOUS
  Filled 2022-10-24 (×2): qty 20

## 2022-10-24 MED ORDER — TOPIRAMATE 25 MG PO TABS
75.0000 mg | ORAL_TABLET | Freq: Every day | ORAL | Status: DC
  Administered 2022-10-24 (×2): 75 mg via ORAL
  Filled 2022-10-24 (×2): qty 3

## 2022-10-24 MED ORDER — SENNOSIDES-DOCUSATE SODIUM 8.6-50 MG PO TABS
1.0000 | ORAL_TABLET | Freq: Every evening | ORAL | Status: DC | PRN
  Administered 2022-10-27: 1 via ORAL
  Filled 2022-10-24 (×2): qty 1

## 2022-10-24 MED ORDER — PANTOPRAZOLE SODIUM 40 MG PO TBEC
40.0000 mg | DELAYED_RELEASE_TABLET | Freq: Every day | ORAL | Status: DC
  Administered 2022-10-24 – 2022-11-02 (×10): 40 mg via ORAL
  Filled 2022-10-24: qty 2
  Filled 2022-10-24 (×3): qty 1
  Filled 2022-10-24: qty 2
  Filled 2022-10-24 (×5): qty 1

## 2022-10-24 MED ORDER — FENTANYL CITRATE PF 50 MCG/ML IJ SOSY
12.5000 ug | PREFILLED_SYRINGE | INTRAMUSCULAR | Status: DC | PRN
  Administered 2022-10-24: 50 ug via INTRAVENOUS
  Filled 2022-10-24: qty 1

## 2022-10-24 MED ORDER — ALPRAZOLAM 0.5 MG PO TABS
0.5000 mg | ORAL_TABLET | Freq: Two times a day (BID) | ORAL | Status: DC | PRN
  Administered 2022-10-24: 0.5 mg via ORAL
  Filled 2022-10-24: qty 1

## 2022-10-24 MED ORDER — LEVOTHYROXINE SODIUM 50 MCG PO TABS
50.0000 ug | ORAL_TABLET | Freq: Every day | ORAL | Status: DC
  Administered 2022-10-24 – 2022-11-04 (×12): 50 ug via ORAL
  Filled 2022-10-24 (×12): qty 1

## 2022-10-24 MED ORDER — ACETAMINOPHEN 325 MG PO TABS
650.0000 mg | ORAL_TABLET | Freq: Four times a day (QID) | ORAL | Status: DC | PRN
  Administered 2022-10-24 – 2022-11-13 (×4): 650 mg via ORAL
  Filled 2022-10-24 (×9): qty 2

## 2022-10-24 MED ORDER — QUETIAPINE FUMARATE 200 MG PO TABS
300.0000 mg | ORAL_TABLET | Freq: Every day | ORAL | Status: DC
  Administered 2022-10-24: 300 mg via ORAL
  Filled 2022-10-24 (×2): qty 1
  Filled 2022-10-24: qty 3

## 2022-10-24 MED ORDER — PROCHLORPERAZINE EDISYLATE 10 MG/2ML IJ SOLN: 10.0000 mg | Freq: Four times a day (QID) | INTRAMUSCULAR | Status: DC | PRN

## 2022-10-24 MED ORDER — APIXABAN 2.5 MG PO TABS
2.5000 mg | ORAL_TABLET | Freq: Two times a day (BID) | ORAL | Status: DC
  Administered 2022-10-24 – 2022-10-25 (×3): 2.5 mg via ORAL
  Filled 2022-10-24 (×4): qty 1

## 2022-10-24 MED ORDER — LORAZEPAM 2 MG/ML IJ SOLN
0.5000 mg | Freq: Once | INTRAMUSCULAR | Status: AC | PRN
  Administered 2022-10-24: 0.5 mg via INTRAVENOUS
  Filled 2022-10-24: qty 1

## 2022-10-24 MED ORDER — INSULIN GLARGINE-YFGN 100 UNIT/ML ~~LOC~~ SOLN
100.0000 [IU] | Freq: Two times a day (BID) | SUBCUTANEOUS | Status: DC
  Administered 2022-10-24: 100 [IU] via SUBCUTANEOUS
  Filled 2022-10-24 (×2): qty 1

## 2022-10-24 MED ORDER — INSULIN GLARGINE-YFGN 100 UNIT/ML ~~LOC~~ SOLN
100.0000 [IU] | Freq: Every day | SUBCUTANEOUS | Status: DC
  Administered 2022-10-25 – 2022-10-27 (×3): 100 [IU] via SUBCUTANEOUS
  Filled 2022-10-24 (×6): qty 1

## 2022-10-24 MED ORDER — INSULIN ASPART 100 UNIT/ML IJ SOLN
0.0000 [IU] | INTRAMUSCULAR | Status: DC
  Administered 2022-10-24: 3 [IU] via SUBCUTANEOUS
  Administered 2022-10-24: 7 [IU] via SUBCUTANEOUS
  Administered 2022-10-24: 2 [IU] via SUBCUTANEOUS
  Administered 2022-10-24: 3 [IU] via SUBCUTANEOUS
  Administered 2022-10-24 – 2022-10-25 (×5): 2 [IU] via SUBCUTANEOUS
  Administered 2022-10-25 – 2022-10-26 (×3): 1 [IU] via SUBCUTANEOUS
  Administered 2022-10-26: 2 [IU] via SUBCUTANEOUS
  Administered 2022-10-26 – 2022-10-29 (×5): 1 [IU] via SUBCUTANEOUS
  Administered 2022-10-30 (×2): 2 [IU] via SUBCUTANEOUS
  Administered 2022-10-30: 1 [IU] via SUBCUTANEOUS
  Administered 2022-10-30 (×2): 2 [IU] via SUBCUTANEOUS
  Administered 2022-10-31: 1 [IU] via SUBCUTANEOUS
  Administered 2022-10-31: 2 [IU] via SUBCUTANEOUS
  Administered 2022-10-31: 1 [IU] via SUBCUTANEOUS
  Administered 2022-10-31 (×3): 2 [IU] via SUBCUTANEOUS
  Administered 2022-11-01 (×3): 1 [IU] via SUBCUTANEOUS
  Administered 2022-11-01: 2 [IU] via SUBCUTANEOUS
  Administered 2022-11-01: 1 [IU] via SUBCUTANEOUS
  Administered 2022-11-01: 2 [IU] via SUBCUTANEOUS
  Administered 2022-11-02 – 2022-11-03 (×7): 1 [IU] via SUBCUTANEOUS
  Administered 2022-11-03: 2 [IU] via SUBCUTANEOUS
  Administered 2022-11-03 – 2022-11-04 (×3): 1 [IU] via SUBCUTANEOUS
  Administered 2022-11-04 (×2): 2 [IU] via SUBCUTANEOUS
  Administered 2022-11-04: 1 [IU] via SUBCUTANEOUS
  Administered 2022-11-04 – 2022-11-05 (×4): 2 [IU] via SUBCUTANEOUS
  Administered 2022-11-05 (×2): 1 [IU] via SUBCUTANEOUS
  Administered 2022-11-05 – 2022-11-07 (×10): 2 [IU] via SUBCUTANEOUS
  Administered 2022-11-07: 3 [IU] via SUBCUTANEOUS

## 2022-10-24 MED ORDER — PREGABALIN 75 MG PO CAPS
75.0000 mg | ORAL_CAPSULE | Freq: Three times a day (TID) | ORAL | Status: DC
  Administered 2022-10-24: 75 mg via ORAL
  Filled 2022-10-24: qty 1
  Filled 2022-10-24: qty 3
  Filled 2022-10-24: qty 1

## 2022-10-24 MED ORDER — PREGABALIN 75 MG PO CAPS
75.0000 mg | ORAL_CAPSULE | Freq: Two times a day (BID) | ORAL | Status: DC
  Filled 2022-10-24 (×2): qty 1

## 2022-10-24 MED ORDER — BUPROPION HCL ER (XL) 150 MG PO TB24
300.0000 mg | ORAL_TABLET | Freq: Every day | ORAL | Status: DC
  Administered 2022-10-24: 300 mg via ORAL
  Filled 2022-10-24: qty 1
  Filled 2022-10-24 (×2): qty 2

## 2022-10-24 MED ORDER — OXYBUTYNIN CHLORIDE ER 5 MG PO TB24
5.0000 mg | ORAL_TABLET | Freq: Every day | ORAL | Status: DC
  Administered 2022-10-24: 5 mg via ORAL
  Filled 2022-10-24 (×3): qty 1

## 2022-10-24 MED ORDER — ACETAMINOPHEN 650 MG RE SUPP
650.0000 mg | Freq: Four times a day (QID) | RECTAL | Status: DC | PRN
  Administered 2022-10-25: 650 mg via RECTAL
  Filled 2022-10-24: qty 1

## 2022-10-24 MED ORDER — SODIUM CHLORIDE 0.9% FLUSH
3.0000 mL | Freq: Two times a day (BID) | INTRAVENOUS | Status: DC
  Administered 2022-10-24 – 2022-11-19 (×47): 3 mL via INTRAVENOUS

## 2022-10-24 MED ORDER — DOXYCYCLINE HYCLATE 100 MG PO TABS
100.0000 mg | ORAL_TABLET | Freq: Two times a day (BID) | ORAL | Status: DC
  Administered 2022-10-24 – 2022-10-25 (×3): 100 mg via ORAL
  Filled 2022-10-24 (×4): qty 1

## 2022-10-24 MED ORDER — CARVEDILOL 12.5 MG PO TABS
37.5000 mg | ORAL_TABLET | Freq: Two times a day (BID) | ORAL | Status: DC
  Administered 2022-10-24 (×2): 37.5 mg via ORAL
  Filled 2022-10-24 (×3): qty 3

## 2022-10-24 NOTE — ED Notes (Signed)
Pt assisted with bedpan for urinary needs at this time.

## 2022-10-24 NOTE — H&P (Signed)
History and Physical    [name] [id number] DOB: [birth year] DOA: 10/23/2022  PCP: Bartholome Bill, MD   Patient coming from: Home   Chief Complaint: LOC, AMS  HPI: [name] is a 52 y.o. female with medical history significant for Bell's palsy, insulin-dependent diabetes mellitus, chronic combined systolic and diastolic CHF, right-sided heart failure, history of DVT on Eliquis, hypothyroidism, depression, and anxiety who presents to the emergency department with change in mental status.  Patient had just been discharged from the hospital on 10/20/2022 after admission for abdominal pain with intractable nausea and vomiting.  She complains of ongoing abdominal pain involving the mid and lower abdomen, and states that she was having nausea and vomiting all throughout the day.  She began to feel lightheaded and was observed to have a brief loss of consciousness that prompted her sister to call EMS.  She was confused and somnolent after this, EMS noted some right-sided weakness, and she was brought into the ED as a code stroke.  ED Course: Upon arrival to the ED, patient is found to be afebrile and saturating well on room air with slightly elevated heart rate and blood pressure.  Labs are most notable for WBC 22,500, albumin 2.9, bicarbonate 18, and creatinine 1.02.  Head CT was negative for acute findings and CTA of the head and neck was also negative.  CT of the abdomen and pelvis is notable for diffuse inflammatory changes and stranding in the pelvis which is new from CT 9 days earlier.  Pelvic exam was performed by the ED physician reports noting cervical motion tenderness.  Patient was treated with Zofran, fentanyl, and antibiotics in the ED.  Review of Systems:  All other systems reviewed and apart from HPI, are negative.  Past Medical History:  Diagnosis Date   Acute renal failure (HCC)     Intractable nausea vomiting secondary to diabetic gastroparesis causing dehydration  and acute renal failure /notes 04/01/2013   Anginal pain ([place])    Anxiety    Bell's palsy 08/09/2010   Bicornuate uterus    CAP (community acquired pneumonia) 10/2011   [facility] Endo 11/08/2011   Chest pain 01/13/2014   CHF (congestive heart failure) ([place])    Diabetic gastroparesis associated with type 2 diabetes mellitus ([place])    this is presumed diagnoses, not confirmed by any studies.    Eczema    Family history of breast cancer    Family history of malignant neoplasm of breast    Family history of malignant neoplasm of ovary    Family history of ovarian cancer    Family history of prostate cancer    GERD (gastroesophageal reflux disease)    High cholesterol    Hypertension    Liver lesion 08/13/2019   Liver mass 2014   biopsied 03/2013 at [facility], not malignant.  is to undergo radiologic ablation of the mass in sept/October 2014.    Lupus ([place])    Migraines    "maybe a couple times/yr" (04/01/2013)   Obesity    Obstructive sleep apnea on CPAP 2011   Oxygen at nights   Pulmonary embolism ([place]) 05/21/2021   SJOGREN'S SYNDROME 08/09/2010   SLE (systemic lupus erythematosus) ([place])    Archie Endo 12/01/2010    Past Surgical History:  Procedure Laterality Date   BREAST CYST EXCISION Left 08/2005   epidermoid   CARDIAC CATHETERIZATION  02/07/2012   ECTOPIC PREGNANCY SURGERY  1999   ESOPHAGOGASTRODUODENOSCOPY N/A 02/26/2013   Procedure: ESOPHAGOGASTRODUODENOSCOPY (EGD);  Surgeon: Irene Shipper, MD;  Location: [facility] ENDOSCOPY;  Service: Endoscopy;  Laterality: N/A;   ESOPHAGOGASTRODUODENOSCOPY N/A 03/02/2015   Procedure: ESOPHAGOGASTRODUODENOSCOPY (EGD);  Surgeon: Milus Banister, MD;  Location: [place];  Service: Endoscopy;  Laterality: N/A;   ESOPHAGOGASTRODUODENOSCOPY N/A 02/23/2021   Procedure: ESOPHAGOGASTRODUODENOSCOPY (EGD);  Surgeon: Wilford Corner, MD;  Location: [place];  Service: Endoscopy;  Laterality: N/A;  possible dilation   HERNIA REPAIR     LEFT AND RIGHT  HEART CATHETERIZATION WITH CORONARY ANGIOGRAM N/A 02/07/2012   Procedure: LEFT AND RIGHT HEART CATHETERIZATION WITH CORONARY ANGIOGRAM;  Surgeon: Laverda Page, MD;  Location: [facility] CATH LAB;  Service: Cardiovascular;  Laterality: N/A;   LIVER BIOPSY  03/2013   liver mass/medical hx noted above   MUSCLE BIOPSY     for lupus/notes [id number]   UMBILICAL HERNIA REPAIR  1980's    Social History:   reports that she quit smoking about 31 years ago. Her smoking use included cigarettes. She has a 1.50 pack-year smoking history. She has never used smokeless tobacco. She reports that she does not drink alcohol and does not use drugs.  Allergies  Allergen Reactions   Metoclopramide Other (See Comments)    Developed restless leg, akathisia type limb movements.    Codeine Itching and Rash   Hydrocodone Rash   Penicillins Itching    Tolerated Unasyn 02/01/2020   Percocet [Oxycodone-Acetaminophen] Hives and Rash    Tolerates dilaudid Tolerates acetaminophen     Family History  Problem Relation Age of Onset   Diabetes Mother    Asthma Mother    Urticaria Mother    Hypertension Mother    Stroke Mother    Ovarian cancer Sister 41       maternal half-sister; RAD51C positive   Asthma Brother    Asthma Brother    Breast cancer Maternal Aunt 60       currently 43   Cancer Maternal Aunt        unk. primary   Cancer Maternal Aunt        unk. primary   Ovarian cancer Maternal Aunt        deceased 23s   Breast cancer Maternal Aunt        deceased 4s   Breast cancer Maternal Aunt    Stomach cancer Maternal Uncle        dx 21s; deceased   Prostate cancer Maternal Uncle        deceased 62s   Breast cancer Cousin        deceased 50s; daughter of mat uncle with stomach ca   Hypertension Other    Stroke Other    Arthritis Other    Allergic rhinitis Neg Hx    Angioedema Neg Hx      Prior to Admission medications   Medication Sig Start Date End Date Taking? Authorizing Provider  ADVAIR  HFA 230-21 MCG/ACT inhaler Inhale 2 puffs into the lungs 2 (two) times daily. 12/20/20   [provider]  albuterol (PROVENTIL) (2.5 MG/3ML) 0.083% nebulizer solution Take 2.5 mg by nebulization as needed for wheezing.    [provider]  ALPRAZolam Duanne Moron) 0.5 MG tablet Take 0.5 mg by mouth as needed for anxiety or sleep. 04/19/17   [provider]  apixaban (ELIQUIS) 2.5 MG TABS tablet Take 1 tablet (2.5 mg total) by mouth 2 (two) times daily. 09/21/22   Adrian Prows, MD  ARIPiprazole (ABILIFY) 5 MG tablet  01/14/21   [provider]  Azathioprine 75 MG TABS Take 150 mg by mouth 2 (two) times daily. 02/04/21   [provider]  B-D UF III MINI PEN NEEDLES 31G X 5 MM MISC USE AS DIRECTED TWICE A DAY 10/31/18   Renato Shin, MD  Belimumab (BENLYSTA) 200 MG/ML SOAJ Inject 200 mg into the skin once a week. Thursdays 12/19/19   [provider]  buPROPion (WELLBUTRIN XL) 300 MG 24 hr tablet Take 300 mg by mouth daily. 03/14/22   [provider]  carvedilol (COREG) 25 MG tablet Take 1.5 tablets (37.5 mg total) by mouth 2 (two) times daily with a meal. 01/01/21   Adrian Prows, MD  Continuous Blood Gluc Sensor (FREESTYLE LIBRE 3 SENSOR) MISC 1 Device by Does not apply route every 14 (fourteen) days. Apply 1 sensor on upper arm every 14 days for continuous glucose monitoring 05/25/22   Elayne Snare, MD  cromolyn (OPTICROM) 4 % ophthalmic solution Place 1 drop into both eyes 2 (two) times daily. 01/14/21   [provider]  cycloSPORINE (RESTASIS) 0.05 % ophthalmic emulsion Place 1 drop into both eyes in the morning, at noon, and at bedtime.     [provider]  diclofenac Sodium (VOLTAREN) 1 % GEL APPLY 2 GRAMS TO AFFECTED AREA 4 TIMES A DAY Patient taking differently: Apply 2 g topically 4 (four) times daily as needed (pain). 01/11/22   Cantwell, Celeste C, PA-C  EASY TOUCH PEN NEEDLES 32G X 4 MM MISC USE TO INJECT INSULIN TWICE DAILY 08/07/19    Renato Shin, MD  famotidine (PEPCID) 20 MG tablet Take 20 mg by mouth 2 (two) times daily.    [provider]  fluticasone (FLONASE) 50 MCG/ACT nasal spray Place 2 sprays into both nostrils continuous as needed for allergies or rhinitis.    [provider]  glucose blood (ACCU-CHEK GUIDE) test strip 1 each by Other route 2 (two) times daily. And lancets 2/day 06/18/21   Renato Shin, MD  glucose blood [facility] ULTRA) test strip Test blood sugar 4 times a day 03/30/22   Elayne Snare, MD  hydroxychloroquine (PLAQUENIL) 200 MG tablet Take 200 mg by mouth 2 (two) times daily.    [provider]  ibuprofen (ADVIL) 600 MG tablet Take 1 tablet (600 mg total) by mouth every 6 (six) hours as needed. Patient taking differently: Take 600 mg by mouth every 6 (six) hours as needed for mild pain. 10/14/22   Montine Circle, PA-C  insulin regular human CONCENTRATED (HUMULIN R U-500 KWIKPEN) 500 UNIT/ML KwikPen 100 UNITS, 30 MINUTES BEFORE EACH MEAL Patient taking differently: 100 Units 3 (three) times daily with meals. 100 Units, 30 minutes before each meal 08/16/22   Elayne Snare, MD  Insulin Syringe-Needle U-100 (EASY TOUCH INSULIN SYRINGE) 31G X 5/16" 1 ML MISC USE AS DIRECTED THREE TIMES A DAY 10/12/18   Renato Shin, MD  JARDIANCE 10 MG TABS tablet TAKE 1 TABLET (10 MG TOTAL) BY MOUTH DAILY WITH BREAKFAST. 08/18/22   Elayne Snare, MD  Lancets [facility] ULTRASOFT) lancets Test blood sugar 4 times a day 03/30/22   Elayne Snare, MD  levothyroxine (SYNTHROID) 50 MCG tablet Take 1 tablet (50 mcg total) by mouth daily at 6 (six) AM. 08/30/19   Raiford Noble Latif, DO  megestrol (MEGACE) 40 MG tablet Take 40 mg by mouth daily. 01/30/15   [provider]  metFORMIN (GLUCOPHAGE-XR) 500 MG 24 hr tablet Take 2 tablets (1,000 mg total) by mouth 2 (two) times daily. 02/02/21   Loanne Drilling,  Hilliard Clark, MD  Multiple Vitamin (MULTIVITAMIN WITH MINERALS) TABS tablet Take 1 tablet by mouth daily. 08/30/19    Raiford Noble Latif, DO  ondansetron (ZOFRAN-ODT) 4 MG disintegrating tablet Take 1 tablet (4 mg total) by mouth every 8 (eight) hours as needed for nausea or vomiting. 10/14/22   Montine Circle, PA-C  oxybutynin (DITROPAN-XL) 5 MG 24 hr tablet Take 5 mg by mouth daily. 04/25/22   [provider]  pregabalin (LYRICA) 75 MG capsule Take 75 mg 3 (three) times daily by mouth.  01/28/15   [provider]  prochlorperazine (COMPAZINE) 10 MG tablet Take 1 tablet (10 mg total) by mouth every 6 (six) hours as needed for nausea or vomiting. 05/14/22   Bonnielee Haff, MD  promethazine (PHENERGAN) 25 MG suppository Place 1 suppository (25 mg total) rectally every 6 (six) hours as needed for nausea or vomiting. 10/21/22   Clark, Meghan R, PA  QUEtiapine (SEROQUEL) 300 MG tablet Take 300 mg by mouth at bedtime. 01/08/21   [provider]  rosuvastatin (CRESTOR) 20 MG tablet Take 1 tablet (20 mg total) by mouth daily. 06/09/21 10/16/22  Cantwell, Celeste C, PA-C  Semaglutide, 2 MG/DOSE, 8 MG/3ML SOPN Inject 2 mg as directed once a week. 08/18/22   Elayne Snare, MD  sucralfate (CARAFATE) 1 GM/10ML suspension Take 10 mLs by mouth 2 (two) times daily as needed (abdomen pain). 12/09/20   [provider]  topiramate (TOPAMAX) 25 MG tablet Take 75 mg by mouth at bedtime. 03/17/16   [provider]  torsemide (DEMADEX) 20 MG tablet Take 1 tablet (20 mg total) by mouth at bedtime. 06/09/21   Cantwell, Celeste C, PA-C  TRANSDERM-SCOP 1 MG/3DAYS Place 1 patch (1.5 mg total) onto the skin every 3 (three) days. As needed for nausea 10/21/22   Theressa Stamps R, PA  Vitamin D, Ergocalciferol, (DRISDOL) 1.25 MG (50000 UNIT) CAPS capsule Take 50,000 Units by mouth once a week. 09/20/22   [provider]    Physical Exam: Vitals:   10/23/22 2100 10/23/22 2104 10/23/22 2200  BP:   (!) 156/84  Pulse:   100  Resp:   15  SpO2:  99% 100%  Weight: (!) 153.2 kg      Constitutional: NAD,  no pallor or diaphoresis   Eyes: PERTLA, lids and conjunctivae normal ENMT: Mucous membranes are moist. Posterior pharynx clear of any exudate or lesions.   Neck: supple, no masses  Respiratory: no wheezing, no crackles. No accessory muscle use.  Cardiovascular: S1 & S2 heard, regular rate and rhythm. No extremity edema.  Abdomen: Soft, no distension, tender in mid and lower abdomen. Bowel sounds active.  Musculoskeletal: no clubbing / cyanosis. No joint deformity upper and lower extremities.   Skin: no significant rashes, lesions, ulcers. Warm, dry, well-perfused. Neurologic: Right facial weakness, CN 2-12 grossly intact otherwise. Moving all extremities. Alert and oriented to person, place, and situation.  Psychiatric: Calm. Cooperative.    Labs and Imaging on Admission: I have personally reviewed following labs and imaging studies  CBC: Recent Labs  Lab 10/18/22 0453 10/19/22 0249 10/20/22 0231 10/21/22 1648 10/23/22 2110  WBC 13.8* 14.7* 13.0* 18.2* 22.5*  NEUTROABS  --   --   --   --  18.8*  HGB 10.5* 10.6* 10.3* 10.8* 11.3*  12.2  HCT 31.4* 31.8* 31.4* 33.1* 34.2*  36.0  MCV 88.0 87.8 89.2 89.5 88.1  PLT 221 267 264 306 [id number]   Basic Metabolic Panel: Recent Labs  Lab 10/18/22  [id number] 10/19/22 0249 10/20/22 0231 10/21/22 1648 10/23/22 2110  NA 138 138 136 137 135  137  K 3.5 3.7 3.2* 3.8 3.7  3.7  CL 106 103 105 106 104  106  CO2 20* 23 23 22  18*  GLUCOSE 198* 241* 225* 225* 202*  206*  BUN 16 12 14 7 7  8   CREATININE 1.31* 1.34* 1.33* 0.94 1.02*  0.80  CALCIUM 8.3* 8.6* 8.3* 9.1 9.0   GFR: Estimated Creatinine Clearance: 129.3 mL/min (by C-G formula based on SCr of 0.8 mg/dL). Liver Function Tests: Recent Labs  Lab 10/17/22 0412 10/21/22 1648 10/23/22 2110  AST 10* 16 13*  ALT 8 11 10   ALKPHOS 85 112 111  BILITOT 0.8 0.7 0.9  PROT 6.0* 6.9 6.9  ALBUMIN 2.8* 2.9* 2.9*   Recent Labs  Lab 10/21/22 1648  LIPASE 32   No results for input(s):  "AMMONIA" in the last 168 hours. Coagulation Profile: Recent Labs  Lab 10/23/22 2110  INR 1.2   Cardiac Enzymes: No results for input(s): "CKTOTAL", "CKMB", "CKMBINDEX", "TROPONINI" in the last 168 hours. BNP (last 3 results) No results for input(s): "PROBNP" in the last 8760 hours. HbA1C: No results for input(s): "HGBA1C" in the last 72 hours. CBG: Recent Labs  Lab 10/19/22 2140 10/20/22 0743 10/20/22 0948 10/20/22 1207 10/20/22 1610  GLUCAP 301* 220* 212* 236* 282*   Lipid Profile: No results for input(s): "CHOL", "HDL", "LDLCALC", "TRIG", "CHOLHDL", "LDLDIRECT" in the last 72 hours. Thyroid Function Tests: No results for input(s): "TSH", "T4TOTAL", "FREET4", "T3FREE", "THYROIDAB" in the last 72 hours. Anemia Panel: No results for input(s): "VITAMINB12", "FOLATE", "FERRITIN", "TIBC", "IRON", "RETICCTPCT" in the last 72 hours. Urine analysis:    Component Value Date/Time   COLORURINE YELLOW 10/23/2022 2235   APPEARANCEUR CLEAR 10/23/2022 2235   LABSPEC >1.046 (H) 10/23/2022 2235   PHURINE 6.0 10/23/2022 2235   GLUCOSEU NEGATIVE 10/23/2022 2235   HGBUR SMALL (A) 10/23/2022 2235   BILIRUBINUR NEGATIVE 10/23/2022 2235   KETONESUR 20 (A) 10/23/2022 2235   PROTEINUR 30 (A) 10/23/2022 2235   UROBILINOGEN 0.2 02/27/2015 1733   NITRITE NEGATIVE 10/23/2022 2235   LEUKOCYTESUR NEGATIVE 10/23/2022 2235   Sepsis Labs: @LABRCNTIP (procalcitonin:4,lacticidven:4) ) Recent Results (from the past 240 hour(s))  Urine Culture     Status: Abnormal   Collection Time: 10/15/22 11:53 AM   Specimen: Urine, Clean Catch  Result Value Ref Range Status   Specimen Description URINE, CLEAN CATCH  Final   Special Requests NONE  Final   Culture (A)  Final    60,000 COLONIES/mL STREPTOCOCCUS AGALACTIAE TESTING AGAINST S. AGALACTIAE NOT ROUTINELY PERFORMED DUE TO PREDICTABILITY OF AMP/PEN/VAN SUSCEPTIBILITY. Performed at [facility], [place] [address], [place], [place] [address]     Report Status 10/17/2022 FINAL  Final  Wet prep, genital     Status: None   Collection Time: 10/23/22 11:20 PM  Result Value Ref Range Status   Yeast Wet Prep HPF POC NONE SEEN NONE SEEN Final   Trich, Wet Prep NONE SEEN NONE SEEN Final    Comment: Specimen diluted due to transport tube containing more than 1 ml of saline, interpret results with caution.   Clue Cells Wet Prep HPF POC NONE SEEN NONE SEEN Final   WBC, Wet Prep HPF POC <10 <10 Final   Sperm NONE SEEN  Final    Comment: Performed at [facility], [address], [place], [place] [address]     Radiological Exams on Admission:  CT ABDOMEN PELVIS W CONTRAST  Result Date: 10/23/2022 CLINICAL DATA:  Abdominal pain. EXAM: CT ABDOMEN AND PELVIS WITH CONTRAST TECHNIQUE: Multidetector CT imaging of the abdomen and pelvis was performed using the standard protocol following bolus administration of intravenous contrast. RADIATION DOSE REDUCTION: This exam was performed according to the departmental dose-optimization program which includes automated exposure control, adjustment of the mA and/or kV according to patient size and/or use of iterative reconstruction technique. CONTRAST:  27mL OMNIPAQUE IOHEXOL 350 MG/ML SOLN COMPARISON:  CT abdomen pelvis dated 10/14/2022. FINDINGS: Lower chest: Minimal bibasilar atelectasis. The visualized lung bases are otherwise clear. No intra-abdominal free air.  Trace free fluid in the pelvis. Hepatobiliary: The liver is unremarkable. No biliary dilatation. The gallbladder is unremarkable Pancreas: Unremarkable. No pancreatic ductal dilatation or surrounding inflammatory changes. Spleen: Normal in size without focal abnormality. Adrenals/Urinary Tract: The adrenal glands are unremarkable there is no hydronephrosis on either side. There is symmetric excretion of contrast by both kidneys. The visualized ureters appear unremarkable. The urinary bladder is collapsed. Stomach/Bowel: There is a small hiatal hernia.  There is no bowel obstruction or active inflammation. The appendix is normal. Vascular/Lymphatic: The abdominal aorta and IVC are unremarkable. No portal venous gas. There is no adenopathy. Reproductive: The uterus appears anteverted with poorly visualized. There is diffuse inflammatory changes and stranding of the pelvic fat, new since the prior CT. There is a partially organized low attenuating collection in the left hemipelvis measuring approximately 5 x 5 cm in greatest axial dimensions concerning for developing abscess. Small amount of high density fluid may represent complex fluid or blood product. Findings may represent pelvic inflammatory disease with developing abscesses. Clinical correlation and further evaluation with pelvic ultrasound recommended. Other: None Musculoskeletal: No acute or significant osseous findings. IMPRESSION: 1. Diffuse inflammatory changes and stranding of the pelvic fat, new since the prior CT. Findings may represent an inflammatory/infectious process such as PID with developing abscess. A small amount of hemorrhagic content may also be present in the pelvis and therefore the possibility of a ruptured hemorrhagic cyst, or trauma is not excluded. No drainable fluid collection/abscess at this time. Clinical correlation and further evaluation with pelvic ultrasound recommended. 2. No bowel obstruction. Normal appendix. Electronically Signed   By: Anner Crete M.D.   On: 10/23/2022 22:08   CT ANGIO HEAD NECK W WO CM (CODE STROKE)  Result Date: 10/23/2022 CLINICAL DATA:  Initial evaluation for stroke. EXAM: CT ANGIOGRAPHY HEAD AND NECK TECHNIQUE: Multidetector CT imaging of the head and neck was performed using the standard protocol during bolus administration of intravenous contrast. Multiplanar CT image reconstructions and MIPs were obtained to evaluate the vascular anatomy. Carotid stenosis measurements (when applicable) are obtained utilizing NASCET criteria, using the distal  internal carotid diameter as the denominator. RADIATION DOSE REDUCTION: This exam was performed according to the departmental dose-optimization program which includes automated exposure control, adjustment of the mA and/or kV according to patient size and/or use of iterative reconstruction technique. CONTRAST:  31mL OMNIPAQUE IOHEXOL 350 MG/ML SOLN COMPARISON:  CT from earlier the same day as well as prior exam from 02/18/2021. FINDINGS: CTA NECK FINDINGS Aortic arch: Visualized aortic arch normal caliber with standard branch pattern. No stenosis about the origin the great vessels. Right carotid system: Right common and internal carotid arteries widely patent without stenosis, dissection or occlusion. Left carotid system: Left common and internal carotid arteries widely patent without stenosis, dissection or occlusion. Vertebral arteries: Both vertebral arteries arise from subclavian arteries. No proximal subclavian  artery stenosis. Vertebral arteries are markedly hypoplastic but patent without stenosis or dissection. Skeleton: No discrete or worrisome osseous lesions. Moderate spondylosis present at C4-5 through C6-7. Other neck: No other acute soft tissue abnormality within the neck. Few punctate sialoliths noted within the left parotid gland. Upper chest: Visualized upper chest demonstrates no acute finding. Review of the MIP images confirms the above findings CTA HEAD FINDINGS Anterior circulation: Both internal carotid arteries are patent to the termini without stenosis. Persistent trigeminal artery noted on the right. A1 segments, anterior communicating complex common anterior cerebral arteries patent without stenosis. No M1 stenosis or occlusion. No proximal MCA branch occlusion or high-grade stenosis. Distal MCA branches perfused and symmetric. Posterior circulation: Both V4 segments patent without stenosis. Both PICA patent. Proximal basilar artery is diminutive but patent. Basilar more normal in caliber  distal to the persistent right trigeminal artery. Superior cerebellar and posterior cerebral arteries patent bilaterally. Venous sinuses: Patent allowing for timing the contrast bolus. Anatomic variants: Persistent trigeminal artery with diminutive vertebrobasilar system. Review of the MIP images confirms the above findings IMPRESSION: 1. Negative CTA of the head and neck. No large vessel occlusion or other emergent finding. 2. 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 These results were communicated to Dr. Leonel Ramsay at 9:59 pm on 10/23/2022 by text page via the [facility] messaging system. Electronically Signed   By: Jeannine Boga M.D.   On: 10/23/2022 22:02   CT HEAD CODE STROKE WO CONTRAST  Result Date: 10/23/2022 CLINICAL DATA:  Code stroke. EXAM: CT HEAD WITHOUT CONTRAST TECHNIQUE: Contiguous axial images were obtained from the base of the skull through the vertex without intravenous contrast. RADIATION DOSE REDUCTION: This exam was performed according to the departmental dose-optimization program which includes automated exposure control, adjustment of the mA and/or kV according to patient size and/or use of iterative reconstruction technique. COMPARISON:  None Available. FINDINGS: Brain: Cerebral volume within normal limits. No acute intracranial hemorrhage. No acute large vessel territory infarct. No mass lesion, midline shift or mass effect. No hydrocephalus or extra-axial fluid collection. Vascular: No abnormal hyperdense vessel. Skull: Scalp soft tissues and calvarium within normal limits. Sinuses/Orbits: Left a spur [place] noted. Few small retention cyst noted within the left maxillary sinus. Mild mucosal thickening noted about the ethmoidal air cells. No mastoid effusion. Other: None. ASPECTS [facility] Stroke Program Early CT Score) - Ganglionic level infarction (caudate, lentiform nuclei, internal capsule, insula, M1-M3 cortex): 7 - Supraganglionic  infarction (M4-M6 cortex): 3 Total score (0-10 with 10 being normal): 10 IMPRESSION: 1. No acute intracranial abnormality. 2. ASPECTS is 10. These results were communicated to Dr. Leonel Ramsay at 9:17 pm on 10/23/2022 by text page via the [facility] messaging system. Electronically Signed   By: Jeannine Boga M.D.   On: 10/23/2022 21:18    EKG: Independently reviewed. Sinus tachycardia, rate 103, QTc 553.   Assessment/Plan   1. Syncope; acute encephalopathy   - Pt had brief LOC followed by confusion and somnolence that appears to be resolving in ED  - Appreciate neurology recommendations  - Continue cardiac monitoring, check orthostatic vitals, echo, TSH, ammonia, and EEG    2. Abdominal pain with N/V; inflammatory process in pelvis  - Current mid-lower abdominal pain and N/V very similar to her numerous prior bouts she's been seen for but she also reports recent chills and CT findings raise question of infectious process in pelvis such as PID - There was cervical motion tenderness on exam per report of ED MD  -  Pelvic US was unfortunately limited with inability to visualize uterus, ovaries, and adnexal structures  - Bowel rest, symptom-management, continue empiric antibiotics, monitor electrolytes, advance diet as she improves    3. Insulin-dependent DM  - A1c was 7.4% in October 2023  - Continue CBG checks and insulin    4. Chronic combined systolic & diastolic CHF; chronic right heart failure  - Appears compensated  - Continue Coreg and torsemide, monitor wt and I/Os   5. SLE;Sjogren's  - Hold Plaquenil for now in light of prolonged QT interval    6. Hx of DVT  - Continue Eliquis    7. Hypothyroidism  - Continue Synthroid    8. Depression, anxiety  - Continue Wellbutrin, Seroquel, and as-needed Xanax; hold Abilify for now in light of prolonged QT interval   9. Prolonged QT interval  - Continue cardiac monitoring, supplement potassium, check magnesium level, avoid QT-prolonging  medications     DVT prophylaxis: Eliquis  Code Status: Full  Level of Care: Level of care: Telemetry Medical Family Communication: none present  Disposition Plan:  Patient is from: home  Anticipated d/c is to: TBD Anticipated d/c date is: Possibly as early as 3/25 or 10/25/22  Patient currently: Pending pain-control, tolerance of adequate oral intake  Consults called: Neurology  Admission status: Observation     Vianne Bulls, MD [facility]  10/24/2022, 12:25 AM

## 2022-10-24 NOTE — ED Notes (Signed)
CT started at this time.

## 2022-10-24 NOTE — TOC Initial Note (Signed)
Transition of Care [facility]) - Initial/Assessment Note    Patient Details  Name: [name] MRN: [id number] Date of Birth: 08/23/1970  Transition of Care [facility]) CM/SW Contact:    Pollie Friar, RN Phone Number: 10/24/2022, 3:10 PM  Clinical Narrative:                 CM met with the patient and she was sleepy throughout the visit. She states she lives home alone and has an aide 7 days a week for a total of 85 hours a month. (About 2-3 hrs a day). [place] is [employer]. She has cane/ walker/ rollator/ shower seat at home. She also has oxygen but states her portable tanks were taken back as she doesn't qualify for them. May need an new ambulatory sats check in the hospital as pt complains of being SOB with minimal activity. Pt manages her own medications and denies any issues. [facility] [address] [facility]. Pt uses medicaid transportation for her appts.  TOC following for further d/c needs.    Expected Discharge Plan: Home/Self Care Barriers to Discharge: Continued Medical Work up   Patient Goals and CMS Choice            Expected Discharge Plan and Services       Living arrangements for the past 2 months: Single Family Home                                      Prior Living Arrangements/Services Living arrangements for the past 2 months: Single Family Home Lives with:: Self Patient language and need for interpreter reviewed:: Yes Do you feel safe going back to the place where you live?: Yes        Care giver support system in place?: Yes (comment)   Criminal Activity/Legal Involvement Pertinent to Current Situation/Hospitalization: No - Comment as needed  Activities of Daily Living      Permission Sought/Granted                  Emotional Assessment Appearance:: Appears stated age Attitude/Demeanor/Rapport:  (lethargic) Affect (typically observed): Accepting Orientation: : Oriented to Self, Oriented to Place, Oriented to   Time, Oriented to Situation   Psych Involvement: No (comment)  Admission diagnosis:  Encephalopathy [G93.40] Acute encephalopathy [G93.40] Pelvic abscess in female [N73.9] PID (acute pelvic inflammatory disease) [N73.0] Patient Active Problem List   Diagnosis Date Noted   Acute encephalopathy 10/24/2022   PID (acute pelvic inflammatory disease) 10/24/2022   Nausea & vomiting 05/09/2022   Vitamin B12 deficiency 01/04/2022   History of pulmonary embolism 12/22/2021   Normocytic anemia 12/22/2021   Foot pain, bilateral 11/22/2021   Genetic testing 08/17/2021   Family history of breast cancer 07/19/2021   Family history of prostate cancer 07/19/2021   Esophageal dysmotility 06/03/2021   Physical debility 08/29/2019   Obesity hypoventilation syndrome ([place]) 08/23/2019   Skin breakdown 08/23/2019   Essential hypertension 08/23/2019   Liver hemorrhage 08/23/2019   History of CVA (cerebrovascular accident) 08/19/2019   Major depressive disorder with single episode, in partial remission ([place]) 08/19/2019   Morbid obesity with BMI of 50.0-59.9, adult ([place]) 08/19/2019   SLE (systemic lupus erythematosus related syndrome) ([place]) 08/19/2019   Hepatic adenoma    Sciatica 04/20/2018   Type 2 diabetes mellitus with hyperlipidemia ([place]) 09/19/2015   Radiculopathy of cervical spine    QT prolongation  03/06/2014   Family history of malignant neoplasm of breast    Family history of malignant neoplasm of ovary    Intractable nausea and vomiting 11/11/2013   Septate uterus 09/08/2013   Diabetic gastroparesis ([place]) 04/28/2013   Constipation due to pain medication 04/21/2013   Odynophagia 04/04/2013   Liver masses 02/28/2013   Generalized abdominal pain 02/26/2013   Reflux esophagitis 02/26/2013   Morbid obesity ([place]) 10/10/2012   Chronic diastolic CHF (congestive heart failure) ([place]) 10/09/2012   Clinical polymyositis syndrome ([place]) 09/06/2012   Mononeuritis of lower limb 01/12/2012    Hypertension complicating diabetes ([place]) 0000000   Chronic systolic CHF (congestive heart failure) ([place]) 11/08/2011   HLD (hyperlipidemia) 07/19/2011   Bell's palsy 08/09/2010   SJOGREN'S SYNDROME 08/09/2010   PCP:  Bartholome Bill, MD Pharmacy:   [id number] [place], [address] [place] [address] Phone: [phone] Fax: [phone]  [facility], [place] [id number] [place] [id number] [place] [address] Phone: [phone] Fax: [phone]  [facility] [address] [place] [address] Phone: [phone] Fax: [phone]  [place], [place] - [address] [place] [address] Phone: [phone] Fax: [phone]     Social Determinants of Health (SDOH) Social History: [place]: No Food Insecurity (10/15/2022)  Housing: Low Risk  (10/15/2022)  Transportation Needs: No Transportation Needs (10/15/2022)  Utilities: Not At Risk (10/15/2022)  Tobacco Use: Medium Risk (10/24/2022)   SDOH Interventions:     Readmission Risk Interventions    05/26/2021    4:02 PM 02/05/2020   10:31 AM  Readmission Risk Prevention Plan  Transportation Screening Complete Complete  Medication Review ([place])  Referral to Pharmacy  PCP or Specialist appointment within 3-5 days of discharge Complete   HRI or [place] Complete Complete  SW Recovery Care/Counseling Consult Complete Complete  [place] Not Applicable Not Applicable

## 2022-10-24 NOTE — ED Notes (Signed)
Pt reporting nausea at this time.  States she is concerned she will not be able to keep her PO medications down.

## 2022-10-24 NOTE — Progress Notes (Signed)
*  PRELIMINARY RESULTS* Echocardiogram 2D Echocardiogram has been performed.  [name] 10/24/2022, 4:11 PM

## 2022-10-24 NOTE — Progress Notes (Signed)
Neurology Progress Note  Brief HPI: 52 year old patient with history of diabetes, hypertension, hyperlipidemia, diabetic gastroparesis, Bell's palsy and PE on Eliquis presented with unresponsiveness after sudden collapse.  During the day she had been complaining of abdominal pain and lightheadedness and dizziness.  CT head and CT angiogram were negative for acute pathology or LVO.  She was found to have cervical motion tenderness and possible pelvic abscesses, but ultrasound was limited.  Subjective: Patient is drowsy with delayed responsiveness, oriented to person and place only, reports that she feels confused.  Exam: Vitals:   10/24/22 0723 10/24/22 0748  BP: 135/86   Pulse: (!) 111 (!) 108  Resp: 20   Temp: 99.2 F (37.3 C)   SpO2: 100%    Gen: In bed, NAD Resp: non-labored breathing, no acute distress Abd: soft, nt  Neuro: Mental Status: Drowsy but oriented to self and place with delayed responses, can follow single step and two-step commands Cranial Nerves: Pupils equal round and reactive, extraocular movements intact, face symmetrical, facial sensation symmetrical, hearing intact to voice, phonation normal, shoulder shrug symmetrical, tongue midline Motor: Good, symmetrical antigravity strength in all 4 extremities Sensory: Intact to light touch throughout Gait: Deferred  Pertinent Labs:    Latest Ref Rng & Units 10/24/2022    6:43 AM 10/23/2022    9:10 PM 10/21/2022    4:48 PM  CBC  WBC 4.0 - 10.5 K/uL 31.1  22.5  18.2   Hemoglobin 12.0 - 15.0 g/dL 10.7  12.2    11.3  10.8   Hematocrit 36.0 - 46.0 % 32.2  36.0    34.2  33.1   Platelets 150 - 400 K/uL 322  305  306        Latest Ref Rng & Units 10/24/2022    6:43 AM 10/23/2022    9:10 PM 10/21/2022    4:48 PM  BMP  Glucose 70 - 99 mg/dL 196  206    202  225   BUN 6 - 20 mg/dL 9  8    7  7    Creatinine 0.44 - 1.00 mg/dL 0.97  0.80    1.02  0.94   Sodium 135 - 145 mmol/L 134  137    135  137   Potassium 3.5 -  5.1 mmol/L 3.7  3.7    3.7  3.8   Chloride 98 - 111 mmol/L 106  106    104  106   CO2 22 - 32 mmol/L 17  18  22    Calcium 8.9 - 10.3 mg/dL 8.6  9.0  9.1   TSH 1.280 Ammonia 20 RPR nonreactive HIV nonreactive Urine drug screen positive for benzodiazepines Ethanol negative  Imaging Reviewed:  CT head: No acute abnormality  CTA head and neck: No LVO or other emergent findings  EEG: Intermittent slow, generalized, suggestive of mild diffuse encephalopathy with no seizures or epileptiform discharges  Assessment: 52 year old patient with history of diabetes, hypertension, hyperlipidemia, diabetic gastroparesis, Bell's palsy and PE on Eliquis presented with unresponsiveness after sudden collapse.  She had been complaining of abdominal pain as well as lightheadedness and dizziness throughout the day prior to presentation.  She has had multiple previous evaluations for left-sided weakness along with headache, she does report that she has a history of stroke, however she has been diagnosed with nonorganic weakness in the past.  She was recently discharged on the 21st after being admitted with intractable nausea and vomiting.  CT head demonstrates no acute abnormality, and EEG  demonstrates no epileptic activity.  On exam, patient is oriented to self and place with delayed responses but demonstrates no focal abnormalities.  She does demonstrate a significant leukocytosis and did have cervical motion tenderness on pelvic exam with suspicion for abscesses.  Unfortunately pelvic ultrasound was nondiagnostic.  Patient is likely due to toxic metabolic encephalopathy in the setting of infection.  This is expected to improve as infection is treated, however a component of delirium may develop.  As there are no focal symptoms, would not obtain MRI at this time.  Impression: Toxic metabolic encephalopathy in setting of pelvic infection  Recommendations: 1) treatment of pelvic infection and toxic metabolic  encephalopathy per primary team 2) delirium precautions 3) neurology will follow as needed  [place] , MSN, AGACNP-BC [facility] See Amion for schedule and pager information 10/24/2022 11:27 AM

## 2022-10-24 NOTE — Procedures (Signed)
Patient Name: [name]  MRN: [id number]  Epilepsy Attending: Lora Havens  Referring Physician/Provider: Vianne Bulls, MD  Date: 10/24/2022 Duration: 25.25 mins  Patient history: 52 year old female with syncope in the setting of pelvic infection. EEG to evaluate for seizure  Level of alertness: lethargic   AEDs during EEG study: PGB, TPM  Technical aspects: This EEG study was done with scalp electrodes positioned according to the 10-20 International system of electrode placement. Electrical activity was reviewed with band pass filter of 1-70Hz , sensitivity of 7 uV/mm, display speed of 27mm/sec with a 60Hz  notched filter applied as appropriate. EEG data were recorded continuously and digitally stored.  Video monitoring was available and reviewed as appropriate.  Description: The posterior dominant rhythm consists of 8 Hz activity of moderate voltage (25-35 uV) seen predominantly in posterior head regions, symmetric and reactive to eye opening and eye closing. EEG showed intermittent generalized 3 to 6 Hz theta-delta slowing. Physiologic photic driving was not seen during photic stimulation.  Hyperventilation was not performed.     ABNORMALITY - Intermittent slow, generalized  IMPRESSION: This study is suggestive of mild diffuse encephalopathy, nonspecific etiology. No seizures or epileptiform discharges were seen throughout the recording.  Please note lack of epileptiform activity during interictal EEG does not exclude the diagnosis of epilepsy.   [name] Barbra Sarks

## 2022-10-24 NOTE — Inpatient Diabetes Management (Signed)
Inpatient Diabetes Program Recommendations  AACE/ADA: New Consensus Statement on Inpatient Glycemic Control (2015)  Target Ranges:  Prepandial:   less than 140 mg/dL      Peak postprandial:   less than 180 mg/dL (1-2 hours)      Critically ill patients:  140 - 180 mg/dL   Lab Results  Component Value Date   GLUCAP 184 (H) 10/24/2022   HGBA1C 7.4 (H) 05/23/2022    Review of Glycemic Control  Latest Reference Range & Units 10/24/22 04:22 10/24/22 07:33 10/24/22 11:53  Glucose-Capillary 70 - 99 mg/dL 203 (H) 186 (H) 184 (H)  (H): Data is abnormally high Diabetes history: Type 2 DM Outpatient Diabetes medications: U-500 100 units TID, Metformin 1000 mg BID, Ozempic 2 mg qwk, Jardiance 10 mg QD Current orders for Inpatient glycemic control: Novolog 0-9 units Q4H, Semglee 100 units BID   Inpatient Diabetes Program Recommendations:     Consider reducing Semglee to QD.   Thanks, Bronson Curb, MSN, RNC-OB Diabetes Coordinator [phone] (8a-5p)

## 2022-10-24 NOTE — ED Notes (Signed)
Pt transferred to inpatient unit at this time via stretcher with all belongings by this RN.

## 2022-10-24 NOTE — ED Notes (Signed)
ED TO INPATIENT HANDOFF REPORT  ED Nurse Name and Phone #: Marliss Czar [phone]  S Name/Age/Gender [name] 52 y.o. female Room/Bed: 031C/031C  Code Status   Code Status: Full Code  Home/SNF/Other Home Patient oriented to: self, place, time, and situation Is this baseline? Yes   Triage Complete: Triage complete  Chief Complaint Acute encephalopathy [G93.40]  Triage Note Pt presents to ED via EMS from home as Code Stroke.  Per report, pt passed out while lowering self to floor.  Upon EMS arrival, pt noted to have right sided facial droop with eyes deviated to left.  LKN 2000.  Pt met at [facility] upon arrival by ED provider and stroke team.   Allergies Allergies  Allergen Reactions   Metoclopramide Other (See Comments)    Developed restless leg, akathisia type limb movements.    Codeine Itching and Rash   Hydrocodone Rash   Penicillins Itching    Tolerated Unasyn 02/01/2020   Percocet [Oxycodone-Acetaminophen] Hives and Rash    Tolerates dilaudid Tolerates acetaminophen     Level of Care/Admitting Diagnosis ED Disposition     ED Disposition  Admit   Condition  --   Comment  Hospital Area: [place] [[id number]]  Level of Care: Telemetry Medical [104]  May place patient in observation at [facility] or [place] if equivalent level of care is available:: No  Covid Evaluation: Asymptomatic - no recent exposure (last 10 days) testing not required  Diagnosis: Acute encephalopathy [id number]  Admitting Physician: Vianne Bulls [id number]  Attending Physician: Vianne Bulls [id number]          B Medical/Surgery History Past Medical History:  Diagnosis Date   Acute renal failure ([place])     Intractable nausea vomiting secondary to diabetic gastroparesis causing dehydration and acute renal failure /notes 04/01/2013   Anginal pain ([place])    Anxiety    Bell's palsy 08/09/2010   Bicornuate uterus    CAP (community acquired pneumonia) 10/2011    Archie Endo 11/08/2011   Chest pain 01/13/2014   CHF (congestive heart failure) ([place])    Diabetic gastroparesis associated with type 2 diabetes mellitus ([place])    this is presumed diagnoses, not confirmed by any studies.    Eczema    Family history of breast cancer    Family history of malignant neoplasm of breast    Family history of malignant neoplasm of ovary    Family history of ovarian cancer    Family history of prostate cancer    GERD (gastroesophageal reflux disease)    High cholesterol    Hypertension    Liver lesion 08/13/2019   Liver mass 2014   biopsied 03/2013 at [facility], not malignant.  is to undergo radiologic ablation of the mass in sept/October 2014.    Lupus ([place])    Migraines    "maybe a couple times/yr" (04/01/2013)   Obesity    Obstructive sleep apnea on CPAP 2011   Oxygen at nights   Pulmonary embolism ([place]) 05/21/2021   SJOGREN'S SYNDROME 08/09/2010   SLE (systemic lupus erythematosus) ([place])    Archie Endo 12/01/2010   Past Surgical History:  Procedure Laterality Date   BREAST CYST EXCISION Left 08/2005   epidermoid   CARDIAC CATHETERIZATION  02/07/2012   ECTOPIC PREGNANCY SURGERY  1999   ESOPHAGOGASTRODUODENOSCOPY N/A 02/26/2013   Procedure: ESOPHAGOGASTRODUODENOSCOPY (EGD);  Surgeon: Irene Shipper, MD;  Location: [facility] ENDOSCOPY;  Service: Endoscopy;  Laterality: N/A;   ESOPHAGOGASTRODUODENOSCOPY N/A 03/02/2015  Procedure: ESOPHAGOGASTRODUODENOSCOPY (EGD);  Surgeon: Milus Banister, MD;  Location: [place];  Service: Endoscopy;  Laterality: N/A;   ESOPHAGOGASTRODUODENOSCOPY N/A 02/23/2021   Procedure: ESOPHAGOGASTRODUODENOSCOPY (EGD);  Surgeon: Wilford Corner, MD;  Location: [place];  Service: Endoscopy;  Laterality: N/A;  possible dilation   HERNIA REPAIR     LEFT AND RIGHT HEART CATHETERIZATION WITH CORONARY ANGIOGRAM N/A 02/07/2012   Procedure: LEFT AND RIGHT HEART CATHETERIZATION WITH CORONARY ANGIOGRAM;  Surgeon: Laverda Page, MD;   [facility];  Service: Cardiovascular;  Laterality: N/A;   LIVER BIOPSY  03/2013   liver mass/medical hx noted above   MUSCLE BIOPSY     for lupus/notes [id number]   UMBILICAL HERNIA REPAIR  1980's     A IV Location/Drains/Wounds Patient Lines/Drains/Airways Status     Active Line/Drains/Airways     Name Placement date Placement time Site Days   Peripheral IV 10/23/22 20 G 1.88" Left;Upper Arm 10/23/22  2252  Arm  1            Intake/Output Last 24 hours No intake or output data in the 24 hours ending 10/24/22 0110  Labs/Imaging Results for orders placed or performed during the hospital encounter of 10/23/22 (from the past 48 hour(s))  I-stat chem 8, ED     Status: Abnormal   Collection Time: 10/23/22  9:10 PM  Result Value Ref Range   Sodium 137 135 - 145 mmol/L   Potassium 3.7 3.5 - 5.1 mmol/L   Chloride 106 98 - 111 mmol/L   BUN 8 6 - 20 mg/dL   Creatinine, Ser 0.80 0.44 - 1.00 mg/dL   Glucose, Bld 206 (H) 70 - 99 mg/dL    Comment: Glucose reference range applies only to samples taken after fasting for at least 8 hours.   Calcium, Ion 1.13 (L) 1.15 - 1.40 mmol/L   TCO2 18 (L) 22 - 32 mmol/L   Hemoglobin 12.2 12.0 - 15.0 g/dL   HCT 36.0 36.0 - 46.0 %  Ethanol     Status: None   Collection Time: 10/23/22  9:10 PM  Result Value Ref Range   Alcohol, Ethyl (B) <10 <10 mg/dL    Comment: (NOTE) Lowest detectable limit for serum alcohol is 10 mg/dL.  For medical purposes only. Performed at [facility], [place] [address], [place], [place] [address]   Protime-INR     Status: Abnormal   Collection Time: 10/23/22  9:10 PM  Result Value Ref Range   Prothrombin Time 15.3 (H) 11.4 - 15.2 seconds   INR 1.2 0.8 - 1.2    Comment: (NOTE) INR goal varies based on device and disease states. Performed at [facility], [place] [address] [place], [place] [address]   APTT     Status: None   Collection Time: 10/23/22  9:10 PM  Result Value Ref Range   aPTT  26 24 - 36 seconds    Comment: Performed at [facility] [address] [place],  [address]  CBC     Status: Abnormal   Collection Time: 10/23/22  9:10 PM  Result Value Ref Range   WBC 22.5 (H) 4.0 - 10.5 K/uL   RBC 3.88 3.87 - 5.11 MIL/uL   Hemoglobin 11.3 (L) 12.0 - 15.0 g/dL   HCT 34.2 (L) 36.0 - 46.0 %   MCV 88.1 80.0 - 100.0 fL   MCH 29.1 26.0 - 34.0 pg   MCHC 33.0 30.0 - 36.0 g/dL  RDW 13.5 11.5 - 15.5 %   Platelets 305 150 - 400 K/uL   nRBC 0.0 0.0 - 0.2 %    Comment: Performed at [facility], [place] [address], [place], [place] [address]  Differential     Status: Abnormal   Collection Time: 10/23/22  9:10 PM  Result Value Ref Range   Neutrophils Relative % 84 %   Neutro Abs 18.8 (H) 1.7 - 7.7 K/uL   Lymphocytes Relative 6 %   Lymphs Abs 1.4 0.7 - 4.0 K/uL   Monocytes Relative 9 %   Monocytes Absolute 2.0 (H) 0.1 - 1.0 K/uL   Eosinophils Relative 0 %   Eosinophils Absolute 0.1 0.0 - 0.5 K/uL   Basophils Relative 0 %   Basophils Absolute 0.1 0.0 - 0.1 K/uL   Immature Granulocytes 1 %   Abs Immature Granulocytes 0.14 (H) 0.00 - 0.07 K/uL    Comment: Performed at [facility] [address], [place], [place] [address]  Comprehensive metabolic panel     Status: Abnormal   Collection Time: 10/23/22  9:10 PM  Result Value Ref Range   Sodium 135 135 - 145 mmol/L   Potassium 3.7 3.5 - 5.1 mmol/L   Chloride 104 98 - 111 mmol/L   CO2 18 (L) 22 - 32 mmol/L   Glucose, Bld 202 (H) 70 - 99 mg/dL    Comment: Glucose reference range applies only to samples taken after fasting for at least 8 hours.   BUN 7 6 - 20 mg/dL   Creatinine, Ser 1.02 (H) 0.44 - 1.00 mg/dL   Calcium 9.0 8.9 - 10.3 mg/dL   Total Protein 6.9 6.5 - 8.1 g/dL   Albumin 2.9 (L) 3.5 - 5.0 g/dL   AST 13 (L) 15 - 41 U/L   ALT 10 0 - 44 U/L   Alkaline Phosphatase 111 38 - 126 U/L   Total Bilirubin 0.9 0.3 - 1.2 mg/dL   GFR, Estimated >60 >60 mL/min    Comment: (NOTE) Calculated using  the CKD-EPI Creatinine Equation (2021)    Anion gap 13 5 - 15    Comment: Performed at [facility], [place] [address], [place], [place] [address]  I-Stat beta hCG blood, ED     Status: None   Collection Time: 10/23/22  9:10 PM  Result Value Ref Range   I-stat hCG, quantitative <5.0 <5 mIU/mL   Comment 3            Comment:   GEST. AGE      CONC.  (mIU/mL)   <=1 WEEK        5 - 50     2 WEEKS       50 - 500     3 WEEKS       100 - 10,000     4 WEEKS     1,000 - 30,000        FEMALE AND NON-PREGNANT FEMALE:     LESS THAN 5 mIU/mL   Urine rapid drug screen (hosp performed)     Status: Abnormal   Collection Time: 10/23/22 10:35 PM  Result Value Ref Range   Opiates NONE DETECTED NONE DETECTED   Cocaine NONE DETECTED NONE DETECTED   Benzodiazepines POSITIVE (A) NONE DETECTED   Amphetamines NONE DETECTED NONE DETECTED   Tetrahydrocannabinol NONE DETECTED NONE DETECTED   Barbiturates NONE DETECTED NONE DETECTED    Comment: (NOTE) DRUG SCREEN FOR MEDICAL PURPOSES ONLY.  IF  CONFIRMATION IS NEEDED FOR ANY PURPOSE, NOTIFY LAB WITHIN 5 DAYS.  LOWEST DETECTABLE LIMITS FOR URINE DRUG SCREEN Drug Class                     Cutoff (ng/mL) Amphetamine and metabolites    1000 Barbiturate and metabolites    200 Benzodiazepine                 200 Opiates and metabolites        300 Cocaine and metabolites        300 THC                            50 Performed at [facility], [place] [address] [place], [place] [address]   Urinalysis, Routine w reflex microscopic -Urine, Unspecified Source     Status: Abnormal   Collection Time: 10/23/22 10:35 PM  Result Value Ref Range   Color, Urine YELLOW YELLOW   APPearance CLEAR CLEAR   Specific Gravity, Urine >1.046 (H) 1.005 - 1.030   pH 6.0 5.0 - 8.0   Glucose, UA NEGATIVE NEGATIVE mg/dL   Hgb urine dipstick SMALL (A) NEGATIVE   Bilirubin Urine NEGATIVE NEGATIVE   Ketones, ur 20 (A) NEGATIVE mg/dL   Protein, ur 30 (A) NEGATIVE mg/dL    Nitrite NEGATIVE NEGATIVE   Leukocytes,Ua NEGATIVE NEGATIVE   RBC / HPF 0-5 0 - 5 RBC/hpf   WBC, UA 0-5 0 - 5 WBC/hpf   Bacteria, UA NONE SEEN NONE SEEN   Squamous Epithelial / HPF 11-20 0 - 5 /HPF   Mucus PRESENT     Comment: Performed at [facility], [place] [address], [place], [place] [address]  Wet prep, genital     Status: None   Collection Time: 10/23/22 11:20 PM  Result Value Ref Range   Yeast Wet Prep HPF POC NONE SEEN NONE SEEN   Trich, Wet Prep NONE SEEN NONE SEEN    Comment: Specimen diluted due to transport tube containing more than 1 ml of saline, interpret results with caution.   Clue Cells Wet Prep HPF POC NONE SEEN NONE SEEN   WBC, Wet Prep HPF POC <10 <10   Sperm NONE SEEN     Comment: Performed at [facility], [place] [address], [place], [place] [address]   US Pelvis Complete  Result Date: 10/24/2022 CLINICAL DATA:  Infection and pain. EXAM: TRANSABDOMINAL ULTRASOUND OF PELVIS TECHNIQUE: Transabdominal ultrasound examination of the pelvis was performed including evaluation of the uterus, ovaries, adnexal regions, and pelvic cul-de-sac. COMPARISON:  CT 10/23/2022 FINDINGS: Uterus Uterus is not visualized. Endometrium Not visualized. Right ovary Not visualized. Left ovary Not visualized. Other findings: Examination is technically limited due to patient's body habitus and incomplete filling of the bladder as well as bowel gas. IMPRESSION: Technically limited examination. Uterus, ovaries, and adnexal structures are not visualized. Electronically Signed   By: Lucienne Capers M.D.   On: 10/24/2022 01:02   CT ABDOMEN PELVIS W CONTRAST  Result Date: 10/23/2022 CLINICAL DATA:  Abdominal pain. EXAM: CT ABDOMEN AND PELVIS WITH CONTRAST TECHNIQUE: Multidetector CT imaging of the abdomen and pelvis was performed using the standard protocol following bolus administration of intravenous contrast. RADIATION DOSE REDUCTION: This exam was performed according to the departmental  dose-optimization program which includes automated exposure control, adjustment of the mA and/or kV according to patient size and/or use of iterative reconstruction technique. CONTRAST:  17mL OMNIPAQUE IOHEXOL 350 MG/ML SOLN COMPARISON:  CT abdomen pelvis dated 10/14/2022. FINDINGS: Lower chest: Minimal bibasilar atelectasis. The visualized lung bases are otherwise clear. No intra-abdominal free air.  Trace free fluid in the pelvis. Hepatobiliary: The liver is unremarkable. No biliary dilatation. The gallbladder is unremarkable Pancreas: Unremarkable. No pancreatic ductal dilatation or surrounding inflammatory changes. Spleen: Normal in size without focal abnormality. Adrenals/Urinary Tract: The adrenal glands are unremarkable there is no hydronephrosis on either side. There is symmetric excretion of contrast by both kidneys. The visualized ureters appear unremarkable. The urinary bladder is collapsed. Stomach/Bowel: There is a small hiatal hernia. There is no bowel obstruction or active inflammation. The appendix is normal. Vascular/Lymphatic: The abdominal aorta and IVC are unremarkable. No portal venous gas. There is no adenopathy. Reproductive: The uterus appears anteverted with poorly visualized. There is diffuse inflammatory changes and stranding of the pelvic fat, new since the prior CT. There is a partially organized low attenuating collection in the left hemipelvis measuring approximately 5 x 5 cm in greatest axial dimensions concerning for developing abscess. Small amount of high density fluid may represent complex fluid or blood product. Findings may represent pelvic inflammatory disease with developing abscesses. Clinical correlation and further evaluation with pelvic ultrasound recommended. Other: None Musculoskeletal: No acute or significant osseous findings. IMPRESSION: 1. Diffuse inflammatory changes and stranding of the pelvic fat, new since the prior CT. Findings may represent an  inflammatory/infectious process such as PID with developing abscess. A small amount of hemorrhagic content may also be present in the pelvis and therefore the possibility of a ruptured hemorrhagic cyst, or trauma is not excluded. No drainable fluid collection/abscess at this time. Clinical correlation and further evaluation with pelvic ultrasound recommended. 2. No bowel obstruction. Normal appendix. Electronically Signed   By: Anner Crete M.D.   On: 10/23/2022 22:08   CT ANGIO HEAD NECK W WO CM (CODE STROKE)  Result Date: 10/23/2022 CLINICAL DATA:  Initial evaluation for stroke. EXAM: CT ANGIOGRAPHY HEAD AND NECK TECHNIQUE: Multidetector CT imaging of the head and neck was performed using the standard protocol during bolus administration of intravenous contrast. Multiplanar CT image reconstructions and MIPs were obtained to evaluate the vascular anatomy. Carotid stenosis measurements (when applicable) are obtained utilizing NASCET criteria, using the distal internal carotid diameter as the denominator. RADIATION DOSE REDUCTION: This exam was performed according to the departmental dose-optimization program which includes automated exposure control, adjustment of the mA and/or kV according to patient size and/or use of iterative reconstruction technique. CONTRAST:  50mL OMNIPAQUE IOHEXOL 350 MG/ML SOLN COMPARISON:  CT from earlier the same day as well as prior exam from 02/18/2021. FINDINGS: CTA NECK FINDINGS Aortic arch: Visualized aortic arch normal caliber with standard branch pattern. No stenosis about the origin the great vessels. Right carotid system: Right common and internal carotid arteries widely patent without stenosis, dissection or occlusion. Left carotid system: Left common and internal carotid arteries widely patent without stenosis, dissection or occlusion. Vertebral arteries: Both vertebral arteries arise from subclavian arteries. No proximal subclavian artery stenosis. Vertebral arteries  are markedly hypoplastic but patent without stenosis or dissection. Skeleton: No discrete or worrisome osseous lesions. Moderate spondylosis present at C4-5 through C6-7. Other neck: No other acute soft tissue abnormality within the neck. Few punctate sialoliths noted within the left parotid gland. Upper chest: Visualized upper chest demonstrates no acute finding. Review of the MIP images confirms the above findings CTA HEAD FINDINGS Anterior circulation: Both internal carotid arteries are patent to the  termini without stenosis. Persistent trigeminal artery noted on the right. A1 segments, anterior communicating complex common anterior cerebral arteries patent without stenosis. No M1 stenosis or occlusion. No proximal MCA branch occlusion or high-grade stenosis. Distal MCA branches perfused and symmetric. Posterior circulation: Both V4 segments patent without stenosis. Both PICA patent. Proximal basilar artery is diminutive but patent. Basilar more normal in caliber distal to the persistent right trigeminal artery. Superior cerebellar and posterior cerebral arteries patent bilaterally. Venous sinuses: Patent allowing for timing the contrast bolus. Anatomic variants: Persistent trigeminal artery with diminutive vertebrobasilar system. Review of the MIP images confirms the above findings IMPRESSION: 1. Negative CTA of the head and neck. No large vessel occlusion or other emergent finding. 2. 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 These results were communicated to Dr. Leonel Ramsay at 9:59 pm on 10/23/2022 by text page via the [facility] messaging system. Electronically Signed   By: Jeannine Boga M.D.   On: 10/23/2022 22:02   CT HEAD CODE STROKE WO CONTRAST  Result Date: 10/23/2022 CLINICAL DATA:  Code stroke. EXAM: CT HEAD WITHOUT CONTRAST TECHNIQUE: Contiguous axial images were obtained from the base of the skull through the vertex without intravenous contrast.  RADIATION DOSE REDUCTION: This exam was performed according to the departmental dose-optimization program which includes automated exposure control, adjustment of the mA and/or kV according to patient size and/or use of iterative reconstruction technique. COMPARISON:  None Available. FINDINGS: Brain: Cerebral volume within normal limits. No acute intracranial hemorrhage. No acute large vessel territory infarct. No mass lesion, midline shift or mass effect. No hydrocephalus or extra-axial fluid collection. Vascular: No abnormal hyperdense vessel. Skull: Scalp soft tissues and calvarium within normal limits. Sinuses/Orbits: Left a spur [place] noted. Few small retention cyst noted within the left maxillary sinus. Mild mucosal thickening noted about the ethmoidal air cells. No mastoid effusion. Other: None. ASPECTS [facility] Stroke Program Early CT Score) - Ganglionic level infarction (caudate, lentiform nuclei, internal capsule, insula, M1-M3 cortex): 7 - Supraganglionic infarction (M4-M6 cortex): 3 Total score (0-10 with 10 being normal): 10 IMPRESSION: 1. No acute intracranial abnormality. 2. ASPECTS is 10. These results were communicated to Dr. Leonel Ramsay at 9:17 pm on 10/23/2022 by text page via the [facility] messaging system. Electronically Signed   By: Jeannine Boga M.D.   On: 10/23/2022 21:18    Pending Labs Unresulted Labs (From admission, onward)     Start     Ordered   10/24/22 [id number]  Basic metabolic panel  Daily,   R      10/24/22 0025   10/24/22 0500  Magnesium  Tomorrow morning,   R        10/24/22 0025   10/24/22 0500  CBC  Daily,   R      10/24/22 0025   10/24/22 0500  Phosphorus  Tomorrow morning,   R        10/24/22 0042   10/24/22 0500  Ammonia  Tomorrow morning,   R        10/24/22 0043   10/24/22 0500  Sedimentation rate  Tomorrow morning,   R        10/24/22 0050   10/24/22 0500  C-reactive protein  Tomorrow morning,   R        10/24/22 0050   10/24/22 0044  Blood gas, venous   Add-on,   TIMED        10/24/22 0043   10/23/22 2225  RPR  (STI Panel)  Once,  URGENT        10/23/22 2224   10/23/22 2225  HIV Antibody (routine testing w rflx)  (STI Panel)  Once,   URGENT        10/23/22 2224            Vitals/Pain Today's Vitals   10/23/22 2104 10/23/22 2200 10/23/22 2300 10/24/22 0000  BP:  (!) 156/84  (!) 141/77  Pulse:  100 (!) 108 (!) 105  Resp:  15 (!) 22 19  Temp:  99.4 F (37.4 C)    TempSrc:  Oral    SpO2: 99% 100% 100% 100%  Weight:        Isolation Precautions No active isolations  Medications Medications  cefTRIAXone (ROCEPHIN) 2 g in sodium chloride 0.9 % 100 mL IVPB (has no administration in time range)  metroNIDAZOLE (FLAGYL) IVPB 500 mg (has no administration in time range)  doxycycline (VIBRA-TABS) tablet 100 mg (has no administration in time range)  carvedilol (COREG) tablet 37.5 mg (has no administration in time range)  rosuvastatin (CRESTOR) tablet 20 mg (has no administration in time range)  torsemide (DEMADEX) tablet 20 mg (has no administration in time range)  ALPRAZolam (XANAX) tablet 0.5 mg (has no administration in time range)  buPROPion (WELLBUTRIN XL) 24 hr tablet 300 mg (has no administration in time range)  QUEtiapine (SEROQUEL) tablet 300 mg (has no administration in time range)  levothyroxine (SYNTHROID) tablet 50 mcg (has no administration in time range)  apixaban (ELIQUIS) tablet 2.5 mg (has no administration in time range)  pregabalin (LYRICA) capsule 75 mg (has no administration in time range)  topiramate (TOPAMAX) tablet 75 mg (has no administration in time range)  oxybutynin (DITROPAN-XL) 24 hr tablet 5 mg (has no administration in time range)  pantoprazole (PROTONIX) EC tablet 40 mg (has no administration in time range)  potassium chloride SA (KLOR-CON M) CR tablet 20 mEq (has no administration in time range)  sodium chloride flush (NS) 0.9 % injection 3 mL (has no administration in time range)  acetaminophen  (TYLENOL) tablet 650 mg (has no administration in time range)    Or  acetaminophen (TYLENOL) suppository 650 mg (has no administration in time range)  fentaNYL (SUBLIMAZE) injection 12.5-50 mcg (50 mcg Intravenous Given 10/24/22 0047)  senna-docusate (Senokot-S) tablet 1 tablet (has no administration in time range)  insulin glargine-yfgn (SEMGLEE) injection 100 Units (has no administration in time range)  insulin aspart (novoLOG) injection 0-9 Units (has no administration in time range)  iohexol (OMNIPAQUE) 350 MG/ML injection 75 mL (75 mLs Intravenous Contrast Given 10/23/22 2138)  iohexol (OMNIPAQUE) 350 MG/ML injection 75 mL (75 mLs Intravenous Contrast Given 10/23/22 2146)  ondansetron (ZOFRAN) injection 4 mg (4 mg Intravenous Given 10/23/22 2233)  fentaNYL (SUBLIMAZE) injection 50 mcg (50 mcg Intravenous Given 10/23/22 2253)    Mobility non-ambulatory     Focused Assessments Neuro Assessment Handoff:  Swallow screen pass? Yes    NIH Stroke Scale  Interval: Initial Level of Consciousness (1a.)   : Alert, keenly responsive LOC Questions (1b. )   : Answers both questions correctly LOC Commands (1c. )   : Performs both tasks correctly Best Gaze (2. )  : Normal Visual (3. )  : No visual loss Facial Palsy (4. )    : Partial paralysis  Motor Arm, Left (5a. )   : Some effort against gravity Motor Arm, Right (5b. ) : Some effort against gravity Motor Leg, Left (6a. )  : Some effort against gravity  Motor Leg, Right (6b. ) : Some effort against gravity Limb Ataxia (7. ): Absent Sensory (8. )  : Normal, no sensory loss Best Language (9. )  : No aphasia Dysarthria (10. ): Normal Extinction/Inattention (11.)   : No Abnormality Complete NIHSS TOTAL: 23 Last date known well: 10/23/22 Last time known well: 2000 Neuro Assessment: Exceptions to WDL Neuro Checks:   Initial (10/23/22 2110)  Has TPA been given? No If patient is a Neuro Trauma and patient is going to OR before floor call  report to [place] nurse: [phone] or [phone]   R Recommendations: See Admitting Provider Note  Report given to:   Additional Notes: Stroke work-up cleared by provider.  Pt endorses ongoing pain; been given Fentanyl doses thus far.  History of Bell's Palsy with residual right-sided facial droop.

## 2022-10-24 NOTE — Hospital Course (Addendum)
[name] is a 52 y.o. F with morbid obesity, sdCHF recovered from 30-35% to 55-60%, hx VTE on Eliquis, hypothyroidism, hx SLE/Sjogrens on azathioprine, Benlysta, Plaquenil, and hx DM with gastroparesis who presented with abdominal pain, found to have sepsis from intra-abdominal abscess, possible TOA vs other intra-abdominal abscess.      Mass not present on CT on 3/15.  Interventional radiology consulted and patient underwent drain placement 3/26.     Patient had been on a limited diet full liquids due to complaints of abdominal pain.  Ileus developped 3/29   Patient has had a prolonged period without eating much, and with persistent ileus, started on PICC line and TPN 4/2.   4/16: drains removed

## 2022-10-24 NOTE — ED Notes (Signed)
Code Stroke cancelled at this time.

## 2022-10-24 NOTE — ED Notes (Addendum)
Pt calling out for pain medications again at this time.  This RN has checked on pt and updated her on current orders multiple times.  Pt recently updated that she has an order for Dilaudid that is being verified but not currently available to administer.  This RN will administer when verified.

## 2022-10-24 NOTE — ED Notes (Signed)
Per Leonel Ramsay MD, no LVO noted on CT scan.

## 2022-10-24 NOTE — Progress Notes (Signed)
EEG complete - results pending 

## 2022-10-24 NOTE — Progress Notes (Signed)
[facility] Progress Note Patient: [name] [id number] DOB: Dec 13, 1970 DOA: 10/23/2022  DOS: the patient was seen and examined on 10/24/2022  Brief hospital course: PMH of class III obesity, chronic combined CHF, type II DM, chronic RV failure, history of DVT, hypothyroidism, depression, anxiety, Bell's Parsi, chronic abdominal pain present to the hospital with complaints of lightheadedness after abdominal pain. Found to have intra-abdominal abscess concerning for PID/TOA.  Neurology was also consulted for encephalopathy. Assessment and Plan: Syncope. Acute metabolic encephalopathy. Presents with what appears to be a syncopal event. There is concern for seizures and therefore EEG was performed which is unremarkable for any active seizures. Ammonia level normal.  TSH normal. HIV nonreactive.  RPR nonreactive.  VBG does not show any evidence of hypercarbia.  Suspect MRI brain will be low yield. Will follow neurology recommendation.  Intra-abdominal abscess. Acute on chronic abdominal pain. Patient have chronic abdominal pain with intractable nausea and vomiting. CT abdomen shows evidence of a 5 x 5 cm fluid collection in the left hemipelvis concerning for developing abscess. GYN was consulted.  Discussed with Dr. Roselie Awkward. She has not had sex in 4 years and the mass was not present on CT 3/15.  GYN does not think that the patient is suffering from a tubo-ovarian abscess. I will discussed with IR for drainage. Currently continue antibiotic therapy.  Hypersomnolence. OHS. Obesity class III. Body mass index is 51.62 kg/m.  Placing the patient at high risk of poor outcome. Patient was recommended to have an outpatient sleep evaluation during her recent hospitalization.  While VBG does not show any evidence of hypercarbia will monitor clinically. Will keep the patient n.p.o. for now as her mentation is not safe to swallow for now. Have speech therapy evaluation  tomorrow.   Chronic combined systolic and diastolic CHF. Follows up with Dr. Einar Gip. Recent echocardiogram shows EF of 35%. Volume status adequate for now.  Continue torsemide per home regimen. Monitor.  Gastroparesis. Patient is not on any medication for gastroparesis. QTc is prolonged. Currently on Compazine. Will resume home scopolamine patch.  History of PE. On Eliquis at home which I will continue for now.  Type 2 diabetes mellitus, uncontrolled with hyperglycemia with gastroparesis. With long-term insulin use. Patient is actually on U-500 insulin at home. Due to n.p.o. status currently on once a day regimen of long-acting insulin. Continue sliding scale insulin.  Lupus and Sjogren's syndrome. Patient is on hydroxychloroquine, belimumab as well as Imuran. Patient remains at risk for infections. Monitor for now.  Anxiety and depression. Continue home regimen.  Hypothyroidism. Continue Synthroid.  Overactive bladder. On Ditropan.  Currently continue.  GERD. Continue PPI.  Diabetic neuropathy. On Lyrica. Due to encephalopathy we will reduce the dose to 75 mg twice daily.  Hyperlipidemia. Continue Crestor.  Subjective: Drowsy and lethargic.  On repeat evaluation able to interact more but remains drowsy.  No nausea or vomiting at the time of my evaluation.  Continues to have abdominal pain.  Physical Exam: General: in Mild distress, No Rash Cardiovascular: S1 and S2 Present, No Murmur Respiratory: Good respiratory effort, Bilateral Air entry present. No Crackles, No wheezes Abdomen: Bowel Sound present, diffuse tenderness Extremities: trace edema Neuro: Drowsy and oriented to self only, no new focal deficit  Data Reviewed: I have Reviewed nursing notes, Vitals, and Lab results. Since last encounter, pertinent lab results CBC and BMP   . I have ordered test including CBC and BMP  . I have discussed pt's care plan and test results with  OB/GYN  .    Disposition: Status is: Inpatient Remains inpatient appropriate because: Need IV antibiotics and further workup  apixaban (ELIQUIS) tablet 2.5 mg Start: 10/24/22 0100 apixaban (ELIQUIS) tablet 2.5 mg   Family Communication: No one at bedside Level of care: Telemetry Medical   Vitals:   10/24/22 0723 10/24/22 0748 10/24/22 1146 10/24/22 1603  BP: 135/86  134/71 114/63  Pulse: (!) 111 (!) 108 (!) 105 94  Resp: 20  19 18   Temp: 99.2 F (37.3 C)  98.9 F (37.2 C) (!) 100.4 F (38 C)  TempSrc: Oral  Oral Oral  SpO2: 100%  98% 100%  Weight:         Author: Berle Mull, MD 10/24/2022 7:16 PM  Please look on [email or web address] to find out who is on call.

## 2022-10-24 NOTE — ED Notes (Signed)
Pt requesting additional pain medication at this time.  Provider notified of same.

## 2022-10-24 NOTE — ED Notes (Signed)
Pt taken to CT at this time.

## 2022-10-25 ENCOUNTER — Inpatient Hospital Stay (HOSPITAL_COMMUNITY): Payer: Medicaid Other

## 2022-10-25 DIAGNOSIS — G9340 Encephalopathy, unspecified: Secondary | ICD-10-CM | POA: Diagnosis not present

## 2022-10-25 LAB — CBC
HCT: 29.5 % — ABNORMAL LOW (ref 36.0–46.0)
Hemoglobin: 9.8 g/dL — ABNORMAL LOW (ref 12.0–15.0)
MCH: 29 pg (ref 26.0–34.0)
MCHC: 33.2 g/dL (ref 30.0–36.0)
MCV: 87.3 fL (ref 80.0–100.0)
Platelets: 285 10*3/uL (ref 150–400)
RBC: 3.38 MIL/uL — ABNORMAL LOW (ref 3.87–5.11)
RDW: 13.6 % (ref 11.5–15.5)
WBC: 34.6 10*3/uL — ABNORMAL HIGH (ref 4.0–10.5)
nRBC: 0 % (ref 0.0–0.2)

## 2022-10-25 LAB — BASIC METABOLIC PANEL
Anion gap: 16 — ABNORMAL HIGH (ref 5–15)
BUN: 18 mg/dL (ref 6–20)
CO2: 17 mmol/L — ABNORMAL LOW (ref 22–32)
Calcium: 8.4 mg/dL — ABNORMAL LOW (ref 8.9–10.3)
Chloride: 103 mmol/L (ref 98–111)
Creatinine, Ser: 2.29 mg/dL — ABNORMAL HIGH (ref 0.44–1.00)
GFR, Estimated: 25 mL/min — ABNORMAL LOW (ref >60)
Glucose, Bld: 111 mg/dL — ABNORMAL HIGH (ref 70–99)
Potassium: 3.9 mmol/L (ref 3.5–5.1)
Sodium: 136 mmol/L (ref 135–145)

## 2022-10-25 LAB — GLUCOSE, CAPILLARY
Glucose-Capillary: 108 mg/dL — ABNORMAL HIGH (ref 70–99)
Glucose-Capillary: 140 mg/dL — ABNORMAL HIGH (ref 70–99)
Glucose-Capillary: 152 mg/dL — ABNORMAL HIGH (ref 70–99)
Glucose-Capillary: 165 mg/dL — ABNORMAL HIGH (ref 70–99)
Glucose-Capillary: 172 mg/dL — ABNORMAL HIGH (ref 70–99)

## 2022-10-25 LAB — CK: Total CK: 145 U/L (ref 38–234)

## 2022-10-25 LAB — OSMOLALITY: Osmolality: 288 mOsm/kg (ref 275–295)

## 2022-10-25 MED ORDER — FLUCONAZOLE 200 MG PO TABS
200.0000 mg | ORAL_TABLET | Freq: Every day | ORAL | Status: AC
  Administered 2022-10-25: 200 mg via ORAL
  Filled 2022-10-25: qty 1

## 2022-10-25 MED ORDER — FENTANYL CITRATE (PF) 100 MCG/2ML IJ SOLN
INTRAMUSCULAR | Status: AC | PRN
  Administered 2022-10-25 (×2): 25 ug via INTRAVENOUS
  Administered 2022-10-25: 50 ug via INTRAVENOUS

## 2022-10-25 MED ORDER — CARVEDILOL 3.125 MG PO TABS
3.1250 mg | ORAL_TABLET | Freq: Two times a day (BID) | ORAL | Status: DC
  Administered 2022-10-25 – 2022-11-11 (×34): 3.125 mg via ORAL
  Filled 2022-10-25 (×34): qty 1

## 2022-10-25 MED ORDER — FLUCONAZOLE 100 MG PO TABS
100.0000 mg | ORAL_TABLET | Freq: Every day | ORAL | Status: AC
  Administered 2022-10-26 – 2022-10-31 (×6): 100 mg via ORAL
  Filled 2022-10-25 (×6): qty 1

## 2022-10-25 MED ORDER — DOXYCYCLINE HYCLATE 100 MG PO TABS: 100.0000 mg | ORAL_TABLET | Freq: Two times a day (BID) | ORAL | Status: DC

## 2022-10-25 MED ORDER — APIXABAN 2.5 MG PO TABS
2.5000 mg | ORAL_TABLET | Freq: Two times a day (BID) | ORAL | Status: DC
  Administered 2022-10-26 – 2022-11-04 (×16): 2.5 mg via ORAL
  Filled 2022-10-25 (×20): qty 1

## 2022-10-25 MED ORDER — SODIUM CHLORIDE 0.9 % IV SOLN
2.0000 g | Freq: Two times a day (BID) | INTRAVENOUS | Status: DC
  Administered 2022-10-25 – 2022-10-26 (×3): 2 g via INTRAVENOUS
  Filled 2022-10-25 (×4): qty 40

## 2022-10-25 MED ORDER — MIDAZOLAM HCL 2 MG/2ML IJ SOLN
INTRAMUSCULAR | Status: AC
  Filled 2022-10-25: qty 2

## 2022-10-25 MED ORDER — LACTATED RINGERS IV SOLN: INTRAVENOUS | Status: DC

## 2022-10-25 MED ORDER — SODIUM CHLORIDE 0.9% FLUSH
5.0000 mL | Freq: Three times a day (TID) | INTRAVENOUS | Status: DC
  Administered 2022-10-25 – 2022-11-18 (×63): 5 mL

## 2022-10-25 MED ORDER — SODIUM CHLORIDE 0.9 % IV SOLN
100.0000 mg | Freq: Two times a day (BID) | INTRAVENOUS | Status: DC
  Administered 2022-10-25 – 2022-10-30 (×10): 100 mg via INTRAVENOUS
  Filled 2022-10-25 (×11): qty 100

## 2022-10-25 MED ORDER — LIDOCAINE-EPINEPHRINE 1 %-1:100000 IJ SOLN
10.0000 mL | Freq: Once | INTRAMUSCULAR | Status: AC
  Administered 2022-10-25: 10 mL

## 2022-10-25 MED ORDER — FENTANYL CITRATE (PF) 100 MCG/2ML IJ SOLN
INTRAMUSCULAR | Status: AC
  Filled 2022-10-25: qty 2

## 2022-10-25 NOTE — Progress Notes (Signed)
Neurology Progress Note   Subjective  Ms. Dache, is seen in further follow-up regarding acute onset alteration in mentation.  She is not a reliable historian, due to AMS  Neurological Exam  Mental Status: is awake and is oriented X 2. Follows simple, but not complex commands CN II-XII: there are no gross lateralizing deficits Motor: the anti-gravity strength is preserved throughout without pathological stretch reflexes or long tract motor signs Sensory: is preserved throughout without focal extinction  Coordination: station and gait is N/T in the inpatient setting. There is no focal dysmetria or gross truncal ataxia  Objective Data   Detailed neurological workup including head CT without contrast, CTA head and neck with contrast and EEG are unremarkable and unrevealing.  The laboratory data is significant for leukocytosis and presence of inflammatory markers, in the setting of underlying systemic infectious etiology (abdominal abscess)  Assessment  Acute multifactorial encephalopathy secondary to metabolic and toxic etiologies, in the setting of underlying systemic infectious pathology Chronic CHF with EF of 35% Uncontrolled type 2 diabetes mellitus History of hypothyroidism  Plan  No specific neurologic intervention is warranted at this time Further disposition, per primary care attending physician DVT prophylaxis, fall and aspiration precautions throughout hospital stay  Thomasene Ripple, [place] [phone]  *My notes are created using the Dragon dictation and unintentional typographical errors could possibly pop-up

## 2022-10-25 NOTE — Progress Notes (Addendum)
[facility] Progress Note Patient: [name] [id number] DOB: 1970/12/01 DOA: 10/23/2022  DOS: the patient was seen and examined on 10/25/2022  Brief hospital course: PMH of class III obesity, chronic combined CHF, type II DM, chronic RV failure, history of DVT, hypothyroidism, depression, anxiety, Bell's Parsi, chronic abdominal pain present to the hospital with complaints of lightheadedness after abdominal pain. Found to have intra-abdominal abscess concerning for PID/TOA.  Neurology was also consulted for encephalopathy. GYN was consulted.  Discussed with Dr. Roselie Awkward. She has not had sex in 4 years and the mass was not present on CT 3/15.  GYN does not think that the patient is suffering from a tubo-ovarian abscess. I will discussed with IR for drainage. Assessment and Plan: Syncope. Acute metabolic encephalopathy. Presents with what appears to be a syncopal event. There is concern for seizures and therefore EEG was performed which is unremarkable for any active seizures. Ammonia level normal.  TSH normal. HIV nonreactive.  RPR nonreactive.  VBG does not show any evidence of hypercarbia.  Suspect MRI brain will be low yield. Appreciate neurology consultation.  Currently signed off  Intra-abdominal abscess. Acute on chronic abdominal pain. Patient have chronic abdominal pain with intractable nausea and vomiting. CT abdomen shows evidence of a 5 x 5 cm fluid collection in the left hemipelvis concerning for developing abscess. GYN was consulted.  Discussed with Dr. Roselie Awkward. She has not had sex in 4 years and the mass was not present on CT 3/15.  GYN does not think that the patient is suffering from a tubo-ovarian abscess. Appreciate IR assistance. Patient underwent IR guided drain placement on 3/26. Due to ongoing worsening leukocytosis as well as fever despite being on antibiotics I will change it to IV meropenem and IV doxycycline. Avoiding vancomycin due to acute AKI.  Blood  cultures ordered.  AKI. Suspect ATN in the setting of sepsis, possibility of contrast-induced injury as well as prerenal etiology in the setting of poor p.o. intake. Initiate with IV fluid.  Monitor closely due to history of CHF. Will initiate further workup with urine studies as well as alter medication according to the renal function. Will monitor Output Bladder scan 0 so far.  If further worsens will require further workup.  Hypersomnolence. OHS. Obesity class III. Body mass index is 51.62 kg/m.  Placing the patient at high risk of poor outcome. Patient was recommended to have an outpatient sleep evaluation during her recent hospitalization.  While VBG does not show any evidence of hypercarbia will monitor clinically. Speech therapy consulted.  Recommend dysphagia to solid when patient feels to tolerate oral diet. Currently on full liquid diet. Currently minimizing psychotropic medications.  Chronic combined systolic and diastolic CHF. Follows up with Dr. Einar Gip. Recent echocardiogram shows EF of 35%. Volume status adequate for now. Holding home torsemide.  Gastroparesis. Patient is not on any medication for gastroparesis. QTc is prolonged. Currently on Compazine. Will resume home scopolamine patch.  History of PE. On Eliquis at home which I will continue for now.  Type 2 diabetes mellitus, uncontrolled with hyperglycemia with gastroparesis. With long-term insulin use. Patient is actually on U-500 insulin at home. Due to n.p.o. status currently on once a day regimen of long-acting insulin. Continue sliding scale insulin.   Lupus and Sjogren's syndrome. Patient is on hydroxychloroquine, belimumab as well as Imuran. Patient remains at risk for infections. Monitor for now.  Anxiety and depression. Continue home regimen.  Hypothyroidism. Continue Synthroid.  Overactive bladder. On Ditropan.  Currently had.  GERD. Continue PPI.  Diabetic neuropathy. On  Lyrica. Due to encephalopathy we will hold it for now.  Hyperlipidemia. Continue Crestor.  Oral candidiasis. Will treat with Diflucan.  Subjective: Remains drowsy but more alert compared to yesterday.  No nausea no vomiting no fever no chills.  Brother was on the phone at the time of my interview and the patient provided permission to discuss medical plan.  Physical Exam: Appears to be in moderate distress. Drowsy but easily arousable. Abdominal pain present. No focal deficits. Asterixis present. Bilateral trace edema seen.  Data Reviewed: I have Reviewed nursing notes, Vitals, and Lab results. Reviewed CBC and BMP.  Discussed with IR. Reordered CBC and BMP.  Disposition: Status is: Inpatient Remains inpatient appropriate because: Need IV antibiotics and further workup  apixaban (ELIQUIS) tablet 2.5 mg Start: 10/25/22 2200 Place and maintain sequential compression device Start: 10/25/22 1103 apixaban (ELIQUIS) tablet 2.5 mg   Family Communication: No one at bedside discussed with brother on the phone. Level of care: Telemetry Medical   Vitals:   10/25/22 1320 10/25/22 1325 10/25/22 1330 10/25/22 1515  BP: 114/67 103/62 120/76 114/63  Pulse: 92 95 95 93  Resp: 18 (!) 23 19 18   Temp:    99.4 F (37.4 C)  TempSrc:    Oral  SpO2: 100% 100% 100% 98%  Weight:      Height:         Author: Berle Mull, MD 10/25/2022 7:21 PM  Please look on [email or web address] to find out who is on call.

## 2022-10-25 NOTE — Evaluation (Signed)
Clinical/Bedside Swallow Evaluation Patient Details  Name: [name] MRN: [id number] Date of Birth: 1971/01/24  Today's Date: 10/25/2022 Time: SLP Start Time (ACUTE ONLY): 1120 SLP Stop Time (ACUTE ONLY): 1135 SLP Time Calculation (min) (ACUTE ONLY): 15 min  Past Medical History:  Past Medical History:  Diagnosis Date   Acute renal failure ([place])     Intractable nausea vomiting secondary to diabetic gastroparesis causing dehydration and acute renal failure /notes 04/01/2013   Anginal pain ([place])    Anxiety    Bell's palsy 08/09/2010   Bicornuate uterus    CAP (community acquired pneumonia) 10/2011   Archie Endo 11/08/2011   Chest pain 01/13/2014   CHF (congestive heart failure) ([place])    Diabetic gastroparesis associated with type 2 diabetes mellitus ([place])    this is presumed diagnoses, not confirmed by any studies.    Eczema    Family history of breast cancer    Family history of malignant neoplasm of breast    Family history of malignant neoplasm of ovary    Family history of ovarian cancer    Family history of prostate cancer    GERD (gastroesophageal reflux disease)    High cholesterol    Hypertension    Liver lesion 08/13/2019   Liver mass 2014   biopsied 03/2013 at [facility], not malignant.  is to undergo radiologic ablation of the mass in sept/October 2014.    Lupus ([place])    Migraines    "maybe a couple times/yr" (04/01/2013)   Obesity    Obstructive sleep apnea on CPAP 2011   Oxygen at nights   Pulmonary embolism ([place]) 05/21/2021   SJOGREN'S SYNDROME 08/09/2010   SLE (systemic lupus erythematosus) ([place])    Archie Endo 12/01/2010   Past Surgical History:  Past Surgical History:  Procedure Laterality Date   BREAST CYST EXCISION Left 08/2005   epidermoid   CARDIAC CATHETERIZATION  02/07/2012   ECTOPIC PREGNANCY SURGERY  1999   ESOPHAGOGASTRODUODENOSCOPY N/A 02/26/2013   Procedure: ESOPHAGOGASTRODUODENOSCOPY (EGD);  Surgeon: Irene Shipper, MD;  Location: [facility] ENDOSCOPY;   Service: Endoscopy;  Laterality: N/A;   ESOPHAGOGASTRODUODENOSCOPY N/A 03/02/2015   Procedure: ESOPHAGOGASTRODUODENOSCOPY (EGD);  Surgeon: Milus Banister, MD;  Location: [place];  Service: Endoscopy;  Laterality: N/A;   ESOPHAGOGASTRODUODENOSCOPY N/A 02/23/2021   Procedure: ESOPHAGOGASTRODUODENOSCOPY (EGD);  Surgeon: Wilford Corner, MD;  Location: [place];  Service: Endoscopy;  Laterality: N/A;  possible dilation   HERNIA REPAIR     LEFT AND RIGHT HEART CATHETERIZATION WITH CORONARY ANGIOGRAM N/A 02/07/2012   Procedure: LEFT AND RIGHT HEART CATHETERIZATION WITH CORONARY ANGIOGRAM;  Surgeon: Laverda Page, MD;  Location: [facility] CATH LAB;  Service: Cardiovascular;  Laterality: N/A;   LIVER BIOPSY  03/2013   liver mass/medical hx noted above   MUSCLE BIOPSY     for lupus/notes [id number]   UMBILICAL HERNIA REPAIR  1980's   HPI:  Patient is a 52 y.o. female with PMH: Bell's palsy, IDDM, chronic combined systolic and diastolic CHF, right sided heart failure, hypothyroidism, depression, anxiety, GERD. She was recently discharged from the hospital on 10/20/22 after admission for abdominal pain and intractable nausea and vomiting. She presented to the hospital for current admission on 10/23/22 with AMS. Sister called EMS after patient observed to have light headedness and brief LOC. In ED, patinet afebrile, saturating well on RA, slightly elevated HR and BP. CT head negative for acute findings and CTA head and neck was also negative. CDT ab/pelvis notable for diffuse inflammatory changes and stranding  in the pelvis which is new from CT 9 days earlier. Per MD note from 10/24/22, patient's mentation was not adequate for PO's and she was kept NPO with order for SLP swallow evaluation next date. (3/26) Patient had FEES completed in 2022 and at that time, was exhibiting normal swallow mechanism.    Assessment / Plan / Recommendation  Clinical Impression  Patient did not exhibit any clinical s/s/ of  dysphagia as per this bedside swallow evaluation, with swallow initiation being timely and no overt s/s aspiration or penetration observed. She does have h/o GERD and during patient interview, she reported that she mainly drinks liquids, eats fruit and soft foods such as mashed potatoes. Per patient, solid textures are the most likely to be problematic in terms of causing nausea, regurgitation/vomiting, so she generally stays away from this texture. At this time, patient feels comfortable with drinking liquids but does not feel ready to try solids. SLP will follow briefly with goal of ensuring patient is tolerating solids. SLP Visit Diagnosis: Dysphagia, unspecified (R13.10)    Aspiration Risk  No limitations    Diet Recommendation Other (Comment);Thin liquid (patient does not feel ready to try solids but SLP recommending up to Dys 3 solids when she does)   Liquid Administration via: Cup;Straw Medication Administration: Whole meds with liquid Supervision: Patient able to self feed Postural Changes: Seated upright at 90 degrees;Remain upright for at least 30 minutes after po intake    Other  Recommendations Oral Care Recommendations: Oral care BID    Recommendations for follow up therapy are one component of a multi-disciplinary discharge planning process, led by the attending physician.  Recommendations may be updated based on patient status, additional functional criteria and insurance authorization.  Follow up Recommendations No SLP follow up      Assistance Recommended at Discharge    Functional Status Assessment Patient has had a recent decline in their functional status and demonstrates the ability to make significant improvements in function in a reasonable and predictable amount of time.  Frequency and Duration min 1 x/week  1 week       Prognosis Prognosis for improved oropharyngeal function: Good      Swallow Study   General Date of Onset: 10/23/22 HPI: Patient is a 52 y.o.  female with PMH: Bell's palsy, IDDM, chronic combined systolic and diastolic CHF, right sided heart failure, hypothyroidism, depression, anxiety, GERD. She was recently discharged from the hospital on 10/20/22 after admission for abdominal pain and intractable nausea and vomiting. She presented to the hospital for current admission on 10/23/22 with AMS. Sister called EMS after patient observed to have light headedness and brief LOC. In ED, patinet afebrile, saturating well on RA, slightly elevated HR and BP. CT head negative for acute findings and CTA head and neck was also negative. CDT ab/pelvis notable for diffuse inflammatory changes and stranding in the pelvis which is new from CT 9 days earlier. Per MD note from 10/24/22, patient's mentation was not adequate for PO's and she was kept NPO with order for SLP swallow evaluation next date. (3/26) Patient had FEES completed in 2022 and at that time, was exhibiting normal swallow mechanism. Type of Study: Bedside Swallow Evaluation Previous Swallow Assessment: FEES in 2022 Diet Prior to this Study: NPO Temperature Spikes Noted: No Respiratory Status: Room air History of Recent Intubation: No Behavior/Cognition: Alert;Cooperative;Pleasant mood Oral Cavity Assessment: Within Functional Limits Oral Care Completed by SLP: No Oral Cavity - Dentition: Adequate natural dentition Vision: Functional for self-feeding  Patient Positioning: Upright in bed Baseline Vocal Quality: Normal Volitional Cough: Strong Volitional Swallow: Able to elicit    Oral/Motor/Sensory Function Overall Oral Motor/Sensory Function: Other (comment) (mild left facial weakness secondary to h/o Bell's Palsy) Facial Symmetry: Abnormal symmetry left Facial Strength: Reduced left Facial Sensation: Reduced left   Ice Chips     Thin Liquid Thin Liquid: Within functional limits Presentation: Straw;Self Fed    Nectar Thick     Honey Thick     Puree Puree: Not tested   Solid      Solid: Not tested      Sonia Baller, MA, CCC-SLP Speech Therapy

## 2022-10-25 NOTE — Progress Notes (Incomplete)
ANTICOAGULATION CONSULT NOTE - Initial Consult  Pharmacy Consult for Heparin Indication: hx pulmonary embolus (Apixaban PTA on hold)  Allergies  Allergen Reactions   Metoclopramide Other (See Comments)    Developed restless leg, akathisia type limb movements.    Codeine Itching and Rash   Hydrocodone Rash   Penicillins Itching    Tolerated Unasyn 02/01/2020   Percocet [Oxycodone-Acetaminophen] Hives and Rash    Tolerates dilaudid Tolerates acetaminophen     Patient Measurements: Weight: (!) 154 kg (339 lb 8.1 oz) Height: 68 inches Heparin Dosing Weight: HEPARIN DW (KG): 102.1  Vital Signs: Temp: 99.2 F (37.3 C) (03/26 0807) Temp Source: Oral (03/26 0807) BP: 108/56 (03/26 0807) Pulse Rate: 97 (03/26 0807)  Labs: Recent Labs    10/23/22 2110 10/24/22 0643 10/25/22 0722  HGB 11.3*  12.2 10.7* 9.8*  HCT 34.2*  36.0 32.2* 29.5*  PLT 305 322 285  APTT 26  --   --   LABPROT 15.3*  --   --   INR 1.2  --   --   CREATININE 1.02*  0.80 0.97 2.29*    Estimated Creatinine Clearance: 45.3 mL/min (A) (by C-G formula based on SCr of 2.29 mg/dL (H)).   Medical History: Past Medical History:  Diagnosis Date   Acute renal failure (HCC)     Intractable nausea vomiting secondary to diabetic gastroparesis causing dehydration and acute renal failure /notes 04/01/2013   Anginal pain ([place])    Anxiety    Bell's palsy 08/09/2010   Bicornuate uterus    CAP (community acquired pneumonia) 10/2011   Archie Endo 11/08/2011   Chest pain 01/13/2014   CHF (congestive heart failure) ([place])    Diabetic gastroparesis associated with type 2 diabetes mellitus ([place])    this is presumed diagnoses, not confirmed by any studies.    Eczema    Family history of breast cancer    Family history of malignant neoplasm of breast    Family history of malignant neoplasm of ovary    Family history of ovarian cancer    Family history of prostate cancer    GERD (gastroesophageal reflux disease)    High  cholesterol    Hypertension    Liver lesion 08/13/2019   Liver mass 2014   biopsied 03/2013 at [facility], not malignant.  is to undergo radiologic ablation of the mass in sept/October 2014.    Lupus ([place])    Migraines    "maybe a couple times/yr" (04/01/2013)   Obesity    Obstructive sleep apnea on CPAP 2011   Oxygen at nights   Pulmonary embolism ([place]) 05/21/2021   SJOGREN'S SYNDROME 08/09/2010   SLE (systemic lupus erythematosus) ([place])    Archie Endo 12/01/2010    Medications:  Medications Prior to Admission  Medication Sig Dispense Refill Last Dose   ADVAIR HFA 230-21 MCG/ACT inhaler Inhale 2 puffs into the lungs 2 (two) times daily.      albuterol (PROVENTIL) (2.5 MG/3ML) 0.083% nebulizer solution Take 2.5 mg by nebulization as needed for wheezing.      ALPRAZolam (XANAX) 0.5 MG tablet Take 0.5 mg by mouth as needed for anxiety or sleep.      apixaban (ELIQUIS) 2.5 MG TABS tablet Take 1 tablet (2.5 mg total) by mouth 2 (two) times daily. 180 tablet 3    ARIPiprazole (ABILIFY) 5 MG tablet       Azathioprine 75 MG TABS Take 150 mg by mouth 2 (two) times daily.      B-D UF III  MINI PEN NEEDLES 31G X 5 MM MISC USE AS DIRECTED TWICE A DAY 100 each 2    Belimumab (BENLYSTA) 200 MG/ML SOAJ Inject 200 mg into the skin once a week. Thursdays      buPROPion (WELLBUTRIN XL) 300 MG 24 hr tablet Take 300 mg by mouth daily.      carvedilol (COREG) 25 MG tablet Take 1.5 tablets (37.5 mg total) by mouth 2 (two) times daily with a meal. 270 tablet 3    Continuous Blood Gluc Sensor (FREESTYLE LIBRE 3 SENSOR) MISC 1 Device by Does not apply route every 14 (fourteen) days. Apply 1 sensor on upper arm every 14 days for continuous glucose monitoring 2 each 2    cromolyn (OPTICROM) 4 % ophthalmic solution Place 1 drop into both eyes 2 (two) times daily.      cycloSPORINE (RESTASIS) 0.05 % ophthalmic emulsion Place 1 drop into both eyes in the morning, at noon, and at bedtime.       diclofenac Sodium  (VOLTAREN) 1 % GEL APPLY 2 GRAMS TO AFFECTED AREA 4 TIMES A DAY (Patient taking differently: Apply 2 g topically 4 (four) times daily as needed (pain).) 400 g 1    EASY TOUCH PEN NEEDLES 32G X 4 MM MISC USE TO INJECT INSULIN TWICE DAILY 100 each 9    famotidine (PEPCID) 20 MG tablet Take 20 mg by mouth 2 (two) times daily.      fluticasone (FLONASE) 50 MCG/ACT nasal spray Place 2 sprays into both nostrils continuous as needed for allergies or rhinitis.      glucose blood (ACCU-CHEK GUIDE) test strip 1 each by Other route 2 (two) times daily. And lancets 2/day 200 each 3    glucose blood (ONETOUCH ULTRA) test strip Test blood sugar 4 times a day 100 each 12    hydroxychloroquine (PLAQUENIL) 200 MG tablet Take 200 mg by mouth 2 (two) times daily.      ibuprofen (ADVIL) 600 MG tablet Take 1 tablet (600 mg total) by mouth every 6 (six) hours as needed. (Patient taking differently: Take 600 mg by mouth every 6 (six) hours as needed for mild pain.) 30 tablet 0    insulin regular human CONCENTRATED (HUMULIN R U-500 KWIKPEN) 500 UNIT/ML KwikPen 100 UNITS, 30 MINUTES BEFORE EACH MEAL (Patient taking differently: 100 Units 3 (three) times daily with meals. 100 Units, 30 minutes before each meal) 6 mL 1    Insulin Syringe-Needle U-100 (EASY TOUCH INSULIN SYRINGE) 31G X 5/16" 1 ML MISC USE AS DIRECTED THREE TIMES A DAY 100 each 10    JARDIANCE 10 MG TABS tablet TAKE 1 TABLET (10 MG TOTAL) BY MOUTH DAILY WITH BREAKFAST. 30 tablet 3    Lancets (ONETOUCH ULTRASOFT) lancets Test blood sugar 4 times a day 100 each 12    levothyroxine (SYNTHROID) 50 MCG tablet Take 1 tablet (50 mcg total) by mouth daily at 6 (six) AM.      megestrol (MEGACE) 40 MG tablet Take 40 mg by mouth daily.  3    metFORMIN (GLUCOPHAGE-XR) 500 MG 24 hr tablet Take 2 tablets (1,000 mg total) by mouth 2 (two) times daily. 180 tablet 3    Multiple Vitamin (MULTIVITAMIN WITH MINERALS) TABS tablet Take 1 tablet by mouth daily.      ondansetron  (ZOFRAN-ODT) 4 MG disintegrating tablet Take 1 tablet (4 mg total) by mouth every 8 (eight) hours as needed for nausea or vomiting. 10 tablet 0    oxybutynin (DITROPAN-XL) 5 MG  24 hr tablet Take 5 mg by mouth daily.      pregabalin (LYRICA) 75 MG capsule Take 75 mg 3 (three) times daily by mouth.       prochlorperazine (COMPAZINE) 10 MG tablet Take 1 tablet (10 mg total) by mouth every 6 (six) hours as needed for nausea or vomiting. 30 tablet 0    promethazine (PHENERGAN) 25 MG suppository Place 1 suppository (25 mg total) rectally every 6 (six) hours as needed for nausea or vomiting. 12 each 0    QUEtiapine (SEROQUEL) 300 MG tablet Take 300 mg by mouth at bedtime.      rosuvastatin (CRESTOR) 20 MG tablet Take 1 tablet (20 mg total) by mouth daily. 90 tablet 3    Semaglutide, 2 MG/DOSE, 8 MG/3ML SOPN Inject 2 mg as directed once a week. 3 mL 1    sucralfate (CARAFATE) 1 GM/10ML suspension Take 10 mLs by mouth 2 (two) times daily as needed (abdomen pain).      topiramate (TOPAMAX) 25 MG tablet Take 75 mg by mouth at bedtime.      torsemide (DEMADEX) 20 MG tablet Take 1 tablet (20 mg total) by mouth at bedtime. 90 tablet 3    TRANSDERM-SCOP 1 MG/3DAYS Place 1 patch (1.5 mg total) onto the skin every 3 (three) days. As needed for nausea 10 patch 0    Vitamin D, Ergocalciferol, (DRISDOL) 1.25 MG (50000 UNIT) CAPS capsule Take 50,000 Units by mouth once a week.       Assessment: 52 yo F on apixaban PTA for hx PE (Oct 2022).  Patient was recently reduced from apixaban 5mg  BID to 2.5mg  BID for ongoing VTE prevention.  Patient admitted 3/24 following a syncopal event and found to have abd pain / pelvic abscess.  WBC elevated and Tm 100.7.  Pt started on IV antibiotics now with plans for abscess drainage.  Pharmacy has been consulted to start heparin while apixaban on hold for procedure.  Last apixaban dose 10/25/2022 0830.  Will need to use aPTT to monitor heparin as Anti-Xa levels will be affected by  recent apixaban use.  Goal of Therapy:  Heparin level 0.3-0.7 units/ml aPTT 66-102 seconds Monitor platelets by anticoagulation protocol: Yes   Plan:  {Plan:3041500}  Bee Hammerschmidt, Rocky Crafts 10/25/2022,9:04 AM

## 2022-10-25 NOTE — Progress Notes (Signed)
Chief Complaint: Patient was seen in consultation today for abdominal abscess   Referring Physician(s): Dr. Berle Mull  Supervising Physician: Sandi Mariscal  Patient Status: [facility] - In-pt  History of Present Illness: [name] is a 52 y.o. female admitted with abd pain, fevers, and leukocytosis. Imaging workup has found lower abdominal/pelvic fluid collection concerning for abscess. IR is asked to eval for perc drainage. PMHx, meds, labs, imaging, allergies reviewed. Has been NPO    Past Medical History:  Diagnosis Date   Acute renal failure ([place])     Intractable nausea vomiting secondary to diabetic gastroparesis causing dehydration and acute renal failure /notes 04/01/2013   Anginal pain ([place])    Anxiety    Bell's palsy 08/09/2010   Bicornuate uterus    CAP (community acquired pneumonia) 10/2011   Archie Endo 11/08/2011   Chest pain 01/13/2014   CHF (congestive heart failure) ([place])    Diabetic gastroparesis associated with type 2 diabetes mellitus ([place])    this is presumed diagnoses, not confirmed by any studies.    Eczema    Family history of breast cancer    Family history of malignant neoplasm of breast    Family history of malignant neoplasm of ovary    Family history of ovarian cancer    Family history of prostate cancer    GERD (gastroesophageal reflux disease)    High cholesterol    Hypertension    Liver lesion 08/13/2019   Liver mass 2014   biopsied 03/2013 at [facility], not malignant.  is to undergo radiologic ablation of the mass in sept/October 2014.    Lupus ([place])    Migraines    "maybe a couple times/yr" (04/01/2013)   Obesity    Obstructive sleep apnea on CPAP 2011   Oxygen at nights   Pulmonary embolism ([place]) 05/21/2021   SJOGREN'S SYNDROME 08/09/2010   SLE (systemic lupus erythematosus) ([place])    Archie Endo 12/01/2010    Past Surgical History:  Procedure Laterality Date   BREAST CYST EXCISION Left 08/2005   epidermoid   CARDIAC  CATHETERIZATION  02/07/2012   ECTOPIC PREGNANCY SURGERY  1999   ESOPHAGOGASTRODUODENOSCOPY N/A 02/26/2013   Procedure: ESOPHAGOGASTRODUODENOSCOPY (EGD);  Surgeon: Irene Shipper, MD;  Location: [facility] ENDOSCOPY;  Service: Endoscopy;  Laterality: N/A;   ESOPHAGOGASTRODUODENOSCOPY N/A 03/02/2015   Procedure: ESOPHAGOGASTRODUODENOSCOPY (EGD);  Surgeon: Milus Banister, MD;  Location: [place];  Service: Endoscopy;  Laterality: N/A;   ESOPHAGOGASTRODUODENOSCOPY N/A 02/23/2021   Procedure: ESOPHAGOGASTRODUODENOSCOPY (EGD);  Surgeon: Wilford Corner, MD;  Location: [place];  Service: Endoscopy;  Laterality: N/A;  possible dilation   HERNIA REPAIR     LEFT AND RIGHT HEART CATHETERIZATION WITH CORONARY ANGIOGRAM N/A 02/07/2012   Procedure: LEFT AND RIGHT HEART CATHETERIZATION WITH CORONARY ANGIOGRAM;  Surgeon: Laverda Page, MD;  Location: [facility] CATH LAB;  Service: Cardiovascular;  Laterality: N/A;   LIVER BIOPSY  03/2013   liver mass/medical hx noted above   MUSCLE BIOPSY     for lupus/notes [id number]   UMBILICAL HERNIA REPAIR  1980's    Allergies: Metoclopramide, Codeine, Hydrocodone, Penicillins, and Percocet [oxycodone-acetaminophen]  Medications:  Current Facility-Administered Medications:    acetaminophen (TYLENOL) tablet 650 mg, 650 mg, Oral, Q6H PRN, 650 mg at 10/24/22 0458 **OR** acetaminophen (TYLENOL) suppository 650 mg, 650 mg, Rectal, Q6H PRN, Opyd, Timothy S, MD   carvedilol (COREG) tablet 3.125 mg, 3.125 mg, Oral, BID WC, Lavina Hamman, MD   cefTRIAXone (ROCEPHIN) 2 g in sodium chloride 0.9 %  100 mL IVPB, 2 g, Intravenous, QHS, Opyd, Ilene Qua, MD, Stopped at 10/24/22 2139   doxycycline (VIBRA-TABS) tablet 100 mg, 100 mg, Oral, Q12H, Opyd, Timothy S, MD, 100 mg at 10/25/22 0825   HYDROmorphone (DILAUDID) injection 0.5 mg, 0.5 mg, Intravenous, Q4H PRN, Opyd, Ilene Qua, MD, 0.5 mg at 10/25/22 [id number]   insulin aspart (novoLOG) injection 0-9 Units, 0-9 Units, Subcutaneous, Q4H,  Opyd, Ilene Qua, MD, 1 Units at 10/25/22 [id number]   insulin glargine-yfgn (SEMGLEE) injection 100 Units, 100 Units, Subcutaneous, Daily, Lavina Hamman, MD, 100 Units at 10/25/22 0825   levothyroxine (SYNTHROID) tablet 50 mcg, 50 mcg, Oral, Q0600, Opyd, Ilene Qua, MD, 50 mcg at 10/25/22 0516   metroNIDAZOLE (FLAGYL) IVPB 500 mg, 500 mg, Intravenous, BID, Opyd, Ilene Qua, MD, Last Rate: 100 mL/hr at 10/25/22 0830, 500 mg at 10/25/22 0830   pantoprazole (PROTONIX) EC tablet 40 mg, 40 mg, Oral, Daily, Opyd, Ilene Qua, MD, 40 mg at 10/25/22 [id number]   prochlorperazine (COMPAZINE) injection 10 mg, 10 mg, Intravenous, Q6H PRN, Lavina Hamman, MD   QUEtiapine (SEROQUEL) tablet 300 mg, 300 mg, Oral, QHS, Opyd, Timothy S, MD, 300 mg at 10/24/22 0459   senna-docusate (Senokot-S) tablet 1 tablet, 1 tablet, Oral, QHS PRN, Opyd, Ilene Qua, MD   sodium chloride flush (NS) 0.9 % injection 3 mL, 3 mL, Intravenous, Q12H, Opyd, Ilene Qua, MD, 3 mL at 10/24/22 2106    Family History  Problem Relation Age of Onset   Diabetes Mother    Asthma Mother    Urticaria Mother    Hypertension Mother    Stroke Mother    Ovarian cancer Sister 68       maternal half-sister; RAD51C positive   Asthma Brother    Asthma Brother    Breast cancer Maternal Aunt 60       currently 29   Cancer Maternal Aunt        unk. primary   Cancer Maternal Aunt        unk. primary   Ovarian cancer Maternal Aunt        deceased 82s   Breast cancer Maternal Aunt        deceased 30s   Breast cancer Maternal Aunt    Stomach cancer Maternal Uncle        dx 5s; deceased   Prostate cancer Maternal Uncle        deceased 69s   Breast cancer Cousin        deceased 31s; daughter of mat uncle with stomach ca   Hypertension Other    Stroke Other    Arthritis Other    Allergic rhinitis Neg Hx    Angioedema Neg Hx     Social History   Socioeconomic History   Marital status: Single    Spouse name: Not on file   Number of children: 1    Years of education: Not on file   Highest education level: Not on file  Occupational History    Employer: UNEMPLOYED    Comment: student  Tobacco Use   Smoking status: Former    Packs/day: 0.50    Years: 3.00    Additional pack years: 0.00    Total pack years: 1.50    Types: Cigarettes    Quit date: 06/02/1991    Years since quitting: 31.4   Smokeless tobacco: Never   Tobacco comments:    quit 28 yrs ago  Vaping Use   Vaping Use: Never used  Substance  and Sexual Activity   Alcohol use: No    Alcohol/week: 0.0 standard drinks of alcohol   Drug use: No   Sexual activity: Yes    Partners: Male    Birth control/protection: None  Other Topics Concern   Not on file  Social History Narrative   Regular exercise-yes   Social Determinants of Health   Financial Resource Strain: Not on file  Food Insecurity: No Food Insecurity (10/15/2022)   Hunger Vital Sign    Worried About Running Out of Food in the Last Year: Never true    Ran Out of Food in the Last Year: Never true  Transportation Needs: No Transportation Needs (10/15/2022)   PRAPARE - Hydrologist (Medical): No    Lack of Transportation (Non-Medical): No  Physical Activity: Not on file  Stress: Not on file  Social Connections: Not on file    Review of Systems: A 12 point ROS discussed and pertinent positives are indicated in the HPI above.  All other systems are negative.  Review of Systems  Vital Signs: BP (!) 108/56 (BP Location: Right Arm)   Pulse 97   Temp 99.2 F (37.3 C) (Oral)   Resp (!) 21   Ht 5\' 8"  (1.727 m)   Wt (!) 339 lb 8.1 oz (154 kg)   LMP 03/24/2017   SpO2 99%   BMI 51.62 kg/m   Physical Exam Constitutional:      Appearance: She is obese. She is not ill-appearing.  HENT:     Mouth/Throat:     Mouth: Mucous membranes are moist.     Pharynx: Oropharynx is clear.  Cardiovascular:     Rate and Rhythm: Normal rate and regular rhythm.     Heart sounds: Normal  heart sounds.  Pulmonary:     Effort: Pulmonary effort is normal. No respiratory distress.     Breath sounds: Normal breath sounds.  Abdominal:     Palpations: Abdomen is soft. There is no mass.     Tenderness: There is abdominal tenderness. There is no guarding.  Skin:    General: Skin is warm and dry.  Neurological:     Mental Status: She is alert.     Imaging: ECHOCARDIOGRAM COMPLETE  Result Date: 10/24/2022    ECHOCARDIOGRAM REPORT   Patient Name:   [name] Date of Exam: 10/24/2022 Medical Rec #:  [id number]     Height:       68.0 in Accession #:    [id number]    Weight:       339.5 lb Date of Birth:  01-02-1971     BSA:          2.559 m Patient Age:    73 years      BP:           134/71 mmHg Patient Gender: F             HR:           96 bpm. Exam Location:  Inpatient Procedure: 2D Echo, Cardiac Doppler, Color Doppler and Intracardiac            Opacification Agent Indications:    Syncope  History:        Patient has prior history of Echocardiogram examinations, most                 recent 05/21/2021. CHF, Stroke, Signs/Symptoms:Syncope; Risk  Factors:Hypertension, Diabetes and Dyslipidemia.  Sonographer:    Wenda Low Referring Phys: [id number] TIMOTHY S OPYD  Sonographer Comments: Technically difficult study due to poor echo windows and patient is obese. IMPRESSIONS  1. Left ventricular ejection fraction, by estimation, is 55 to 60%. The left ventricle has normal function. Left ventricular endocardial border not optimally defined to evaluate regional wall motion. There is mild concentric left ventricular hypertrophy. Left ventricular diastolic parameters are indeterminate.  2. Right ventricular systolic function is normal. The right ventricular size is normal. There is normal pulmonary artery systolic pressure.  3. The mitral valve was not well visualized. Trivial mitral valve regurgitation. No evidence of mitral stenosis.  4. The aortic valve is tricuspid. There is mild  calcification of the aortic valve. Aortic valve regurgitation is not visualized. Aortic valve sclerosis/calcification is present, without any evidence of aortic stenosis. Aortic valve area, by VTI measures  2.08 cm. Aortic valve mean gradient measures 5.0 mmHg. Aortic valve Vmax measures 1.46 m/s.  5. The inferior vena cava is normal in size with greater than 50% respiratory variability, suggesting right atrial pressure of 3 mmHg.  6. Very difficult images due to poor sound wave transmission. LVEF appears normal but endocardium not well seen. FINDINGS  Left Ventricle: Left ventricular ejection fraction, by estimation, is 55 to 60%. The left ventricle has normal function. Left ventricular endocardial border not optimally defined to evaluate regional wall motion. Definity contrast agent was given IV to delineate the left ventricular endocardial borders. The left ventricular internal cavity size was normal in size. There is mild concentric left ventricular hypertrophy. Left ventricular diastolic parameters are indeterminate. Right Ventricle: The right ventricular size is normal. No increase in right ventricular wall thickness. Right ventricular systolic function is normal. There is normal pulmonary artery systolic pressure. The tricuspid regurgitant velocity is 2.33 m/s, and  with an assumed right atrial pressure of 8 mmHg, the estimated right ventricular systolic pressure is [id number] mmHg. Left Atrium: Left atrial size was not well visualized. Right Atrium: Right atrial size was not well visualized. Pericardium: Trivial pericardial effusion is present. The pericardial effusion is anterior to the right ventricle. Mitral Valve: The mitral valve was not well visualized. Trivial mitral valve regurgitation. No evidence of mitral valve stenosis. MV peak gradient, 2.0 mmHg. The mean mitral valve gradient is 1.0 mmHg. Tricuspid Valve: The tricuspid valve is normal in structure. Tricuspid valve regurgitation is trivial. No  evidence of tricuspid stenosis. Aortic Valve: The aortic valve is tricuspid. There is mild calcification of the aortic valve. Aortic valve regurgitation is not visualized. Aortic valve sclerosis/calcification is present, without any evidence of aortic stenosis. Aortic valve mean gradient measures 5.0 mmHg. Aortic valve peak gradient measures 8.5 mmHg. Aortic valve area, by VTI measures 2.08 cm. Pulmonic Valve: The pulmonic valve was normal in structure. Pulmonic valve regurgitation is trivial. No evidence of pulmonic stenosis. Aorta: The aortic root is normal in size and structure. Venous: The inferior vena cava is normal in size with greater than 50% respiratory variability, suggesting right atrial pressure of 3 mmHg. IAS/Shunts: No atrial level shunt detected by color flow Doppler.  LEFT VENTRICLE PLAX 2D LVIDd:         4.90 cm LVIDs:         3.30 cm LV PW:         1.30 cm LV IVS:        1.10 cm LVOT diam:     2.10 cm LV SV:  50 LV SV Index:   20 LVOT Area:     3.46 cm  LEFT ATRIUM         Index LA diam:    3.10 cm 1.21 cm/m  AORTIC VALVE                    PULMONIC VALVE AV Area (Vmax):    1.97 cm     PV Vmax:       0.88 m/s AV Area (Vmean):   1.72 cm     PV Peak grad:  3.1 mmHg AV Area (VTI):     2.08 cm AV Vmax:           146.00 cm/s AV Vmean:          99.100 cm/s AV VTI:            0.241 m AV Peak Grad:      8.5 mmHg AV Mean Grad:      5.0 mmHg LVOT Vmax:         82.90 cm/s LVOT Vmean:        49.200 cm/s LVOT VTI:          0.145 m LVOT/AV VTI ratio: 0.60  AORTA Ao Root diam: 3.30 cm MITRAL VALVE               TRICUSPID VALVE MV Area (PHT): 4.12 cm    TR Peak grad:   21.7 mmHg MV Area VTI:   3.12 cm    TR Vmax:        233.00 cm/s MV Peak grad:  2.0 mmHg MV Mean grad:  1.0 mmHg    SHUNTS MV Vmax:       0.70 m/s    Systemic VTI:  0.14 m MV Vmean:      45.6 cm/s   Systemic Diam: 2.10 cm MV Decel Time: 184 msec MV E velocity: 57.00 cm/s MV A velocity: 62.10 cm/s MV E/A ratio:  0.92 Glori Bickers  MD Electronically signed by Glori Bickers MD Signature Date/Time: 10/24/2022/4:34:36 PM    Final    EEG adult  Result Date: 10/24/2022 Lora Havens, MD     10/24/2022 10:12 AM Patient Name: [name] MRN: [id number] Epilepsy Attending: Lora Havens Referring Physician/Provider: Vianne Bulls, MD Date: 10/24/2022 Duration: 25.25 mins Patient history: 52 year old female with syncope in the setting of pelvic infection. EEG to evaluate for seizure Level of alertness: lethargic AEDs during EEG study: PGB, TPM Technical aspects: This EEG study was done with scalp electrodes positioned according to the 10-20 International system of electrode placement. Electrical activity was reviewed with band pass filter of 1-70Hz , sensitivity of 7 uV/mm, display speed of 51mm/sec with a 60Hz  notched filter applied as appropriate. EEG data were recorded continuously and digitally stored.  Video monitoring was available and reviewed as appropriate. Description: The posterior dominant rhythm consists of 8 Hz activity of moderate voltage (25-35 uV) seen predominantly in posterior head regions, symmetric and reactive to eye opening and eye closing. EEG showed intermittent generalized 3 to 6 Hz theta-delta slowing. Physiologic photic driving was not seen during photic stimulation.  Hyperventilation was not performed.   ABNORMALITY - Intermittent slow, generalized IMPRESSION: This study is suggestive of mild diffuse encephalopathy, nonspecific etiology. No seizures or epileptiform discharges were seen throughout the recording. Please note lack of epileptiform activity during interictal EEG does not exclude the diagnosis of epilepsy. Priyanka Barbra Sarks   US Pelvis Complete  Result Date:  10/24/2022 CLINICAL DATA:  Infection and pain. EXAM: TRANSABDOMINAL ULTRASOUND OF PELVIS TECHNIQUE: Transabdominal ultrasound examination of the pelvis was performed including evaluation of the uterus, ovaries, adnexal regions, and pelvic  cul-de-sac. COMPARISON:  CT 10/23/2022 FINDINGS: Uterus Uterus is not visualized. Endometrium Not visualized. Right ovary Not visualized. Left ovary Not visualized. Other findings: Examination is technically limited due to patient's body habitus and incomplete filling of the bladder as well as bowel gas. IMPRESSION: Technically limited examination. Uterus, ovaries, and adnexal structures are not visualized. Electronically Signed   By: Lucienne Capers M.D.   On: 10/24/2022 01:02   CT ABDOMEN PELVIS W CONTRAST  Result Date: 10/23/2022 CLINICAL DATA:  Abdominal pain. EXAM: CT ABDOMEN AND PELVIS WITH CONTRAST TECHNIQUE: Multidetector CT imaging of the abdomen and pelvis was performed using the standard protocol following bolus administration of intravenous contrast. RADIATION DOSE REDUCTION: This exam was performed according to the departmental dose-optimization program which includes automated exposure control, adjustment of the mA and/or kV according to patient size and/or use of iterative reconstruction technique. CONTRAST:  47mL OMNIPAQUE IOHEXOL 350 MG/ML SOLN COMPARISON:  CT abdomen pelvis dated 10/14/2022. FINDINGS: Lower chest: Minimal bibasilar atelectasis. The visualized lung bases are otherwise clear. No intra-abdominal free air.  Trace free fluid in the pelvis. Hepatobiliary: The liver is unremarkable. No biliary dilatation. The gallbladder is unremarkable Pancreas: Unremarkable. No pancreatic ductal dilatation or surrounding inflammatory changes. Spleen: Normal in size without focal abnormality. Adrenals/Urinary Tract: The adrenal glands are unremarkable there is no hydronephrosis on either side. There is symmetric excretion of contrast by both kidneys. The visualized ureters appear unremarkable. The urinary bladder is collapsed. Stomach/Bowel: There is a small hiatal hernia. There is no bowel obstruction or active inflammation. The appendix is normal. Vascular/Lymphatic: The abdominal aorta and IVC  are unremarkable. No portal venous gas. There is no adenopathy. Reproductive: The uterus appears anteverted with poorly visualized. There is diffuse inflammatory changes and stranding of the pelvic fat, new since the prior CT. There is a partially organized low attenuating collection in the left hemipelvis measuring approximately 5 x 5 cm in greatest axial dimensions concerning for developing abscess. Small amount of high density fluid may represent complex fluid or blood product. Findings may represent pelvic inflammatory disease with developing abscesses. Clinical correlation and further evaluation with pelvic ultrasound recommended. Other: None Musculoskeletal: No acute or significant osseous findings. IMPRESSION: 1. Diffuse inflammatory changes and stranding of the pelvic fat, new since the prior CT. Findings may represent an inflammatory/infectious process such as PID with developing abscess. A small amount of hemorrhagic content may also be present in the pelvis and therefore the possibility of a ruptured hemorrhagic cyst, or trauma is not excluded. No drainable fluid collection/abscess at this time. Clinical correlation and further evaluation with pelvic ultrasound recommended. 2. No bowel obstruction. Normal appendix. Electronically Signed   By: Anner Crete M.D.   On: 10/23/2022 22:08   CT ANGIO HEAD NECK W WO CM (CODE STROKE)  Result Date: 10/23/2022 CLINICAL DATA:  Initial evaluation for stroke. EXAM: CT ANGIOGRAPHY HEAD AND NECK TECHNIQUE: Multidetector CT imaging of the head and neck was performed using the standard protocol during bolus administration of intravenous contrast. Multiplanar CT image reconstructions and MIPs were obtained to evaluate the vascular anatomy. Carotid stenosis measurements (when applicable) are obtained utilizing NASCET criteria, using the distal internal carotid diameter as the denominator. RADIATION DOSE REDUCTION: This exam was performed according to the departmental  dose-optimization program which includes automated exposure control, adjustment of  the mA and/or kV according to patient size and/or use of iterative reconstruction technique. CONTRAST:  27mL OMNIPAQUE IOHEXOL 350 MG/ML SOLN COMPARISON:  CT from earlier the same day as well as prior exam from 02/18/2021. FINDINGS: CTA NECK FINDINGS Aortic arch: Visualized aortic arch normal caliber with standard branch pattern. No stenosis about the origin the great vessels. Right carotid system: Right common and internal carotid arteries widely patent without stenosis, dissection or occlusion. Left carotid system: Left common and internal carotid arteries widely patent without stenosis, dissection or occlusion. Vertebral arteries: Both vertebral arteries arise from subclavian arteries. No proximal subclavian artery stenosis. Vertebral arteries are markedly hypoplastic but patent without stenosis or dissection. Skeleton: No discrete or worrisome osseous lesions. Moderate spondylosis present at C4-5 through C6-7. Other neck: No other acute soft tissue abnormality within the neck. Few punctate sialoliths noted within the left parotid gland. Upper chest: Visualized upper chest demonstrates no acute finding. Review of the MIP images confirms the above findings CTA HEAD FINDINGS Anterior circulation: Both internal carotid arteries are patent to the termini without stenosis. Persistent trigeminal artery noted on the right. A1 segments, anterior communicating complex common anterior cerebral arteries patent without stenosis. No M1 stenosis or occlusion. No proximal MCA branch occlusion or high-grade stenosis. Distal MCA branches perfused and symmetric. Posterior circulation: Both V4 segments patent without stenosis. Both PICA patent. Proximal basilar artery is diminutive but patent. Basilar more normal in caliber distal to the persistent right trigeminal artery. Superior cerebellar and posterior cerebral arteries patent bilaterally. Venous  sinuses: Patent allowing for timing the contrast bolus. Anatomic variants: Persistent trigeminal artery with diminutive vertebrobasilar system. Review of the MIP images confirms the above findings IMPRESSION: 1. Negative CTA of the head and neck. No large vessel occlusion or other emergent finding. 2. 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 These results were communicated to Dr. Leonel Ramsay at 9:59 pm on 10/23/2022 by text page via the [facility] messaging system. Electronically Signed   By: Jeannine Boga M.D.   On: 10/23/2022 22:02   CT HEAD CODE STROKE WO CONTRAST  Result Date: 10/23/2022 CLINICAL DATA:  Code stroke. EXAM: CT HEAD WITHOUT CONTRAST TECHNIQUE: Contiguous axial images were obtained from the base of the skull through the vertex without intravenous contrast. RADIATION DOSE REDUCTION: This exam was performed according to the departmental dose-optimization program which includes automated exposure control, adjustment of the mA and/or kV according to patient size and/or use of iterative reconstruction technique. COMPARISON:  None Available. FINDINGS: Brain: Cerebral volume within normal limits. No acute intracranial hemorrhage. No acute large vessel territory infarct. No mass lesion, midline shift or mass effect. No hydrocephalus or extra-axial fluid collection. Vascular: No abnormal hyperdense vessel. Skull: Scalp soft tissues and calvarium within normal limits. Sinuses/Orbits: Left a spur [place] noted. Few small retention cyst noted within the left maxillary sinus. Mild mucosal thickening noted about the ethmoidal air cells. No mastoid effusion. Other: None. ASPECTS [facility] Stroke Program Early CT Score) - Ganglionic level infarction (caudate, lentiform nuclei, internal capsule, insula, M1-M3 cortex): 7 - Supraganglionic infarction (M4-M6 cortex): 3 Total score (0-10 with 10 being normal): 10 IMPRESSION: 1. No acute intracranial abnormality. 2.  ASPECTS is 10. These results were communicated to Dr. Leonel Ramsay at 9:17 pm on 10/23/2022 by text page via the [facility] messaging system. Electronically Signed   By: Jeannine Boga M.D.   On: 10/23/2022 21:18   DG Chest Portable 1 View  Result Date: 10/15/2022 CLINICAL DATA:  Chest pain EXAM: PORTABLE  CHEST 1 VIEW COMPARISON:  Radiograph 10/14/2022 FINDINGS: Unchanged cardiomegaly. There are mild lower lung predominant interstitial opacities. No large effusion or evidence of pneumothorax. Bones are unchanged. IMPRESSION: Unchanged cardiomegaly. Mild lower lung predominant interstitial opacities, could reflect mild interstitial edema. Electronically Signed   By: Maurine Simmering M.D.   On: 10/15/2022 11:07   CT ABDOMEN PELVIS W CONTRAST  Result Date: 10/14/2022 CLINICAL DATA:  Substernal chest discomfort. Periumbilical abdominal pain, nausea, and vomiting. EXAM: CT ABDOMEN AND PELVIS WITH CONTRAST TECHNIQUE: Multidetector CT imaging of the abdomen and pelvis was performed using the standard protocol following bolus administration of intravenous contrast. RADIATION DOSE REDUCTION: This exam was performed according to the departmental dose-optimization program which includes automated exposure control, adjustment of the mA and/or kV according to patient size and/or use of iterative reconstruction technique. CONTRAST:  135mL OMNIPAQUE IOHEXOL 350 MG/ML SOLN COMPARISON:  05/09/2022. FINDINGS: Lower chest: The heart is enlarged. Mild atelectasis is noted at the lung bases. Hepatobiliary: A subcentimeter hypodensity is present in the anterior right lobe of the liver which is too small to further characterize. No biliary ductal dilatation. The gallbladder is without stones. Pancreas: Unremarkable. No pancreatic ductal dilatation or surrounding inflammatory changes. Spleen: Normal in size without focal abnormality. Adrenals/Urinary Tract: No adrenal nodule or mass. The kidneys enhance symmetrically. A nonobstructive  renal calculus is noted on the right. No hydronephrosis bilaterally the bladder is unremarkable. Stomach/Bowel: There is a small hiatal hernia. Stomach is within normal limits. Appendix appears normal. No evidence of bowel wall thickening, distention, or inflammatory changes. No free air or pneumatosis. Vascular/Lymphatic: No significant vascular findings are present. No enlarged abdominal or pelvic lymph nodes. Reproductive: The uterus is within normal limits. There is a 7 mm fat attenuation lesion in the left adnexa, possible dermoid. No adnexal mass on the right. Other: No abdominopelvic ascites. Musculoskeletal: Mild degenerative changes in the lumbar spine. No acute or suspicious osseous abnormality. IMPRESSION: 1. No acute intra-abdominal process. 2. Small hiatal hernia. Electronically Signed   By: Brett Fairy M.D.   On: 10/14/2022 04:41   DG Chest Port 1 View  Result Date: 10/14/2022 CLINICAL DATA:  Shortness of breath EXAM: PORTABLE CHEST 1 VIEW COMPARISON:  05/09/2022 FINDINGS: The heart size and mediastinal contours are within normal limits. Both lungs are clear. The visualized skeletal structures are unremarkable. IMPRESSION: No active disease. Electronically Signed   By: Ulyses Jarred M.D.   On: 10/14/2022 01:16    Labs:  CBC: Recent Labs    10/21/22 1648 10/23/22 2110 10/24/22 0643 10/25/22 0722  WBC 18.2* 22.5* 31.1* 34.6*  HGB 10.8* 11.3*  12.2 10.7* 9.8*  HCT 33.1* 34.2*  36.0 32.2* 29.5*  PLT 306 305 322 285    COAGS: Recent Labs    10/23/22 2110  INR 1.2  APTT 26    BMP: Recent Labs    10/21/22 1648 10/23/22 2110 10/24/22 0643 10/25/22 0722  NA 137 135  137 134* 136  K 3.8 3.7  3.7 3.7 3.9  CL 106 104  106 106 103  CO2 22 18* 17* 17*  GLUCOSE 225* 202*  206* 196* 111*  BUN 7 7  8 9 18   CALCIUM 9.1 9.0 8.6* 8.4*  CREATININE 0.94 1.02*  0.80 0.97 2.29*  GFRNONAA >60 >60 >60 25*    LIVER FUNCTION TESTS: Recent Labs    10/16/22 0314  10/17/22 0412 10/21/22 1648 10/23/22 2110  BILITOT 0.9 0.8 0.7 0.9  AST 14* 10* 16 13*  ALT  10 8 11 10   ALKPHOS 89 85 112 111  PROT 6.4* 6.0* 6.9 6.9  ALBUMIN 3.1* 2.8* 2.9* 2.9*     Assessment and Plan: Abdominal abscess. Imaging reviewed, amenable to CT guided percutaneous drainage. Labs reviewed. Pt NPO Eliquis has been held since admission  Risks and benefits discussed with the patient including bleeding, infection, damage to adjacent structures, bowel perforation/fistula connection, and sepsis.  All of the patient's questions were answered, patient is agreeable to proceed. Consent signed and in chart.   Electronically Signed: Ascencion Dike, PA-C 10/25/2022, 9:55 AM   I spent a total of 20 minutes in face to face in clinical consultation, greater than 50% of which was counseling/coordinating care for abscess drain

## 2022-10-25 NOTE — Procedures (Signed)
  Procedure:  CT LLQ drain catheter placement 84ml purulent sent for GS, C&S Preprocedure diagnosis: The primary encounter diagnosis was Encephalopathy. A diagnosis of Pelvic abscess in female was also pertinent to this visit. Postprocedure diagnosis: same EBL:    minimal Complications:   none immediate  See full dictation in [facility].  Dillard Cannon MD Main # [phone] Pager  [phone] Mobile [phone]

## 2022-10-25 NOTE — Progress Notes (Addendum)
Pharmacy Antibiotic Note  [name] is a 52 y.o. female admitted on 10/23/2022 with syncopal event and concern for intra-abdominal infection / pelvic abscess.  Patient was previously on Rocephin, Doxycycline, and Flagyl with continued fever (Tm 100.7) and rising WBC.  Pharmacy has been consulted for Vancomycin and Meropenem dosing.  Of note, SCr has also significantly increased from 1 >> 2.29.  Communicated with Dr. Posey Pronto, will keep Doxy instead of Vancomycin given AKI.  Plan: Start Meropenem 2gm IV q12h Continue Doxycycline 100mg  IV q12h  Height: 5\' 8"  (172.7 cm) Weight: (!) 154 kg (339 lb 8.1 oz) IBW/kg (Calculated) : 63.9  Temp (24hrs), Avg:99.6 F (37.6 C), Min:98.7 F (37.1 C), Max:100.7 F (38.2 C)  Recent Labs  Lab 10/20/22 0231 10/21/22 1648 10/23/22 2110 10/24/22 0643 10/25/22 0722  WBC 13.0* 18.2* 22.5* 31.1* 34.6*  CREATININE 1.33* 0.94 1.02*  0.80 0.97 2.29*    Estimated Creatinine Clearance: 45.3 mL/min (A) (by C-G formula based on SCr of 2.29 mg/dL (H)).    Allergies  Allergen Reactions   Metoclopramide Other (See Comments)    Developed restless leg, akathisia type limb movements.    Codeine Itching and Rash   Hydrocodone Rash   Penicillins Itching    Tolerated Unasyn 02/01/2020   Percocet [Oxycodone-Acetaminophen] Hives and Rash    Tolerates dilaudid Tolerates acetaminophen     Antimicrobials this admission: CTX 3/25 >> 3/26 Doxy 3/25 >>  Flagyl 3/25 >> 3/26 Merrem 3/26 >>  Dose adjustments this admission:   Microbiology results: 3/26 BCx: pending   Thank you for allowing pharmacy to be a part of this patient's care.  Elmer Ramp 10/25/2022 12:44 PM  Addendum:  Asked to add fluconazole for oropharyngeal thrush.  Will start fluconazole 200mg  PO x 1, followed by 100mg  daily x 7 days.  [employer], Pharm.D., BCPS Clinical Pharmacist  **Pharmacist phone directory can be found on [email or web address] listed under [place].  10/25/2022 2:08 PM

## 2022-10-26 ENCOUNTER — Ambulatory Visit: Payer: Medicaid Other | Admitting: Endocrinology

## 2022-10-26 DIAGNOSIS — G9340 Encephalopathy, unspecified: Secondary | ICD-10-CM | POA: Diagnosis not present

## 2022-10-26 DIAGNOSIS — N179 Acute kidney failure, unspecified: Secondary | ICD-10-CM

## 2022-10-26 DIAGNOSIS — A419 Sepsis, unspecified organism: Secondary | ICD-10-CM

## 2022-10-26 DIAGNOSIS — R6520 Severe sepsis without septic shock: Secondary | ICD-10-CM

## 2022-10-26 DIAGNOSIS — F324 Major depressive disorder, single episode, in partial remission: Secondary | ICD-10-CM | POA: Diagnosis not present

## 2022-10-26 LAB — URINALYSIS, ROUTINE W REFLEX MICROSCOPIC
Glucose, UA: NEGATIVE mg/dL
Hgb urine dipstick: NEGATIVE
Ketones, ur: 5 mg/dL — AB
Leukocytes,Ua: NEGATIVE
Nitrite: NEGATIVE
Protein, ur: 100 mg/dL — AB
Specific Gravity, Urine: 1.038 — ABNORMAL HIGH (ref 1.005–1.030)
pH: 5 (ref 5.0–8.0)

## 2022-10-26 LAB — COMPREHENSIVE METABOLIC PANEL
ALT: 14 U/L (ref 0–44)
AST: 17 U/L (ref 15–41)
Albumin: 1.9 g/dL — ABNORMAL LOW (ref 3.5–5.0)
Alkaline Phosphatase: 105 U/L (ref 38–126)
Anion gap: 11 (ref 5–15)
BUN: 25 mg/dL — ABNORMAL HIGH (ref 6–20)
CO2: 19 mmol/L — ABNORMAL LOW (ref 22–32)
Calcium: 8.2 mg/dL — ABNORMAL LOW (ref 8.9–10.3)
Chloride: 105 mmol/L (ref 98–111)
Creatinine, Ser: 1.92 mg/dL — ABNORMAL HIGH (ref 0.44–1.00)
GFR, Estimated: 31 mL/min — ABNORMAL LOW (ref >60)
Glucose, Bld: 150 mg/dL — ABNORMAL HIGH (ref 70–99)
Potassium: 3.8 mmol/L (ref 3.5–5.1)
Sodium: 135 mmol/L (ref 135–145)
Total Bilirubin: 1.3 mg/dL — ABNORMAL HIGH (ref 0.3–1.2)
Total Protein: 5.8 g/dL — ABNORMAL LOW (ref 6.5–8.1)

## 2022-10-26 LAB — GLUCOSE, CAPILLARY
Glucose-Capillary: 135 mg/dL — ABNORMAL HIGH (ref 70–99)
Glucose-Capillary: 136 mg/dL — ABNORMAL HIGH (ref 70–99)
Glucose-Capillary: 139 mg/dL — ABNORMAL HIGH (ref 70–99)
Glucose-Capillary: 143 mg/dL — ABNORMAL HIGH (ref 70–99)
Glucose-Capillary: 143 mg/dL — ABNORMAL HIGH (ref 70–99)
Glucose-Capillary: 145 mg/dL — ABNORMAL HIGH (ref 70–99)
Glucose-Capillary: 169 mg/dL — ABNORMAL HIGH (ref 70–99)

## 2022-10-26 LAB — CBC
HCT: 29 % — ABNORMAL LOW (ref 36.0–46.0)
Hemoglobin: 9.9 g/dL — ABNORMAL LOW (ref 12.0–15.0)
MCH: 28.9 pg (ref 26.0–34.0)
MCHC: 34.1 g/dL (ref 30.0–36.0)
MCV: 84.8 fL (ref 80.0–100.0)
Platelets: 277 10*3/uL (ref 150–400)
RBC: 3.42 MIL/uL — ABNORMAL LOW (ref 3.87–5.11)
RDW: 13.3 % (ref 11.5–15.5)
WBC: 24 10*3/uL — ABNORMAL HIGH (ref 4.0–10.5)
nRBC: 0 % (ref 0.0–0.2)

## 2022-10-26 LAB — CREATININE, URINE, RANDOM: Creatinine, Urine: 577 mg/dL

## 2022-10-26 LAB — NA AND K (SODIUM & POTASSIUM), RAND UR
Potassium Urine: 70 mmol/L
Sodium, Ur: 16 mmol/L

## 2022-10-26 LAB — OSMOLALITY, URINE: Osmolality, Ur: 640 mOsm/kg (ref 300–900)

## 2022-10-26 MED ORDER — HYDROMORPHONE HCL 1 MG/ML IJ SOLN
0.5000 mg | INTRAMUSCULAR | Status: DC | PRN
  Administered 2022-10-26 – 2022-11-11 (×84): 0.5 mg via INTRAVENOUS
  Filled 2022-10-26 (×85): qty 0.5

## 2022-10-26 MED ORDER — ALBUMIN HUMAN 25 % IV SOLN
12.5000 g | Freq: Once | INTRAVENOUS | Status: AC
  Administered 2022-10-26: 12.5 g via INTRAVENOUS
  Filled 2022-10-26: qty 50

## 2022-10-26 MED ORDER — QUETIAPINE FUMARATE 200 MG PO TABS
200.0000 mg | ORAL_TABLET | Freq: Every day | ORAL | Status: DC
  Administered 2022-10-26 – 2022-11-18 (×24): 200 mg via ORAL
  Filled 2022-10-26 (×24): qty 1

## 2022-10-26 MED ORDER — SODIUM CHLORIDE 0.9 % IV SOLN: INTRAVENOUS | Status: DC

## 2022-10-26 MED ORDER — SODIUM CHLORIDE 0.9 % IV SOLN
2.0000 g | Freq: Three times a day (TID) | INTRAVENOUS | Status: DC
  Administered 2022-10-26 – 2022-10-30 (×12): 2 g via INTRAVENOUS
  Filled 2022-10-26 (×14): qty 40

## 2022-10-26 NOTE — Progress Notes (Signed)
Referring Physician(s): Dr. Berle Mull  Supervising Physician: Markus Daft  Patient Status:  [facility] - In-pt  Chief Complaint: Intra-abdominal fluid collection  Subjective: Patient lying in bed.  Reports ongoing pain with movement or coughing.  VSS.   Allergies: Metoclopramide, Codeine, Hydrocodone, Penicillins, and Percocet [oxycodone-acetaminophen]  Medications: Prior to Admission medications   Medication Sig Start Date End Date Taking? Authorizing Provider  ADVAIR HFA 230-21 MCG/ACT inhaler Inhale 2 puffs into the lungs 2 (two) times daily. 12/20/20   [provider]  albuterol (PROVENTIL) (2.5 MG/3ML) 0.083% nebulizer solution Take 2.5 mg by nebulization as needed for wheezing.    [provider]  ALPRAZolam Duanne Moron) 0.5 MG tablet Take 0.5 mg by mouth as needed for anxiety or sleep. 04/19/17   [provider]  apixaban (ELIQUIS) 2.5 MG TABS tablet Take 1 tablet (2.5 mg total) by mouth 2 (two) times daily. 09/21/22   Adrian Prows, MD  ARIPiprazole (ABILIFY) 5 MG tablet  01/14/21   [provider]  Azathioprine 75 MG TABS Take 150 mg by mouth 2 (two) times daily. 02/04/21   [provider]  B-D UF III MINI PEN NEEDLES 31G X 5 MM MISC USE AS DIRECTED TWICE A DAY 10/31/18   Renato Shin, MD  Belimumab (BENLYSTA) 200 MG/ML SOAJ Inject 200 mg into the skin once a week. Thursdays 12/19/19   [provider]  buPROPion (WELLBUTRIN XL) 300 MG 24 hr tablet Take 300 mg by mouth daily. 03/14/22   [provider]  carvedilol (COREG) 25 MG tablet Take 1.5 tablets (37.5 mg total) by mouth 2 (two) times daily with a meal. 01/01/21   Adrian Prows, MD  Continuous Blood Gluc Sensor (FREESTYLE LIBRE 3 SENSOR) MISC 1 Device by Does not apply route every 14 (fourteen) days. Apply 1 sensor on upper arm every 14 days for continuous glucose monitoring 05/25/22   Elayne Snare, MD  cromolyn (OPTICROM) 4 % ophthalmic solution Place 1 drop into both eyes 2 (two)  times daily. 01/14/21   [provider]  cycloSPORINE (RESTASIS) 0.05 % ophthalmic emulsion Place 1 drop into both eyes in the morning, at noon, and at bedtime.     [provider]  diclofenac Sodium (VOLTAREN) 1 % GEL APPLY 2 GRAMS TO AFFECTED AREA 4 TIMES A DAY Patient taking differently: Apply 2 g topically 4 (four) times daily as needed (pain). 01/11/22   Cantwell, Celeste C, PA-C  EASY TOUCH PEN NEEDLES 32G X 4 MM MISC USE TO INJECT INSULIN TWICE DAILY 08/07/19   Renato Shin, MD  famotidine (PEPCID) 20 MG tablet Take 20 mg by mouth 2 (two) times daily.    [provider]  fluticasone (FLONASE) 50 MCG/ACT nasal spray Place 2 sprays into both nostrils continuous as needed for allergies or rhinitis.    [provider]  glucose blood (ACCU-CHEK GUIDE) test strip 1 each by Other route 2 (two) times daily. And lancets 2/day 06/18/21   Renato Shin, MD  glucose blood [facility] ULTRA) test strip Test blood sugar 4 times a day 03/30/22   Elayne Snare, MD  hydroxychloroquine (PLAQUENIL) 200 MG tablet Take 200 mg by mouth 2 (two) times daily.    [provider]  ibuprofen (ADVIL) 600 MG tablet Take 1 tablet (600 mg total) by mouth every 6 (six) hours as needed. Patient taking differently: Take 600 mg by mouth every 6 (six) hours as needed for mild pain. 10/14/22   Montine Circle, PA-C  insulin regular human  CONCENTRATED (HUMULIN R U-500 KWIKPEN) 500 UNIT/ML KwikPen 100 UNITS, 30 MINUTES BEFORE EACH MEAL Patient taking differently: 100 Units 3 (three) times daily with meals. 100 Units, 30 minutes before each meal 08/16/22   Elayne Snare, MD  Insulin Syringe-Needle U-100 (EASY TOUCH INSULIN SYRINGE) 31G X 5/16" 1 ML MISC USE AS DIRECTED THREE TIMES A DAY 10/12/18   Renato Shin, MD  JARDIANCE 10 MG TABS tablet TAKE 1 TABLET (10 MG TOTAL) BY MOUTH DAILY WITH BREAKFAST. 08/18/22   Elayne Snare, MD  Lancets [facility] ULTRASOFT) lancets Test blood sugar 4 times a day  03/30/22   Elayne Snare, MD  levothyroxine (SYNTHROID) 50 MCG tablet Take 1 tablet (50 mcg total) by mouth daily at 6 (six) AM. 08/30/19   Raiford Noble Latif, DO  megestrol (MEGACE) 40 MG tablet Take 40 mg by mouth daily. 01/30/15   [provider]  metFORMIN (GLUCOPHAGE-XR) 500 MG 24 hr tablet Take 2 tablets (1,000 mg total) by mouth 2 (two) times daily. 02/02/21   Renato Shin, MD  Multiple Vitamin (MULTIVITAMIN WITH MINERALS) TABS tablet Take 1 tablet by mouth daily. 08/30/19   Raiford Noble Latif, DO  ondansetron (ZOFRAN-ODT) 4 MG disintegrating tablet Take 1 tablet (4 mg total) by mouth every 8 (eight) hours as needed for nausea or vomiting. 10/14/22   Montine Circle, PA-C  oxybutynin (DITROPAN-XL) 5 MG 24 hr tablet Take 5 mg by mouth daily. 04/25/22   [provider]  pregabalin (LYRICA) 75 MG capsule Take 75 mg 3 (three) times daily by mouth.  01/28/15   [provider]  prochlorperazine (COMPAZINE) 10 MG tablet Take 1 tablet (10 mg total) by mouth every 6 (six) hours as needed for nausea or vomiting. 05/14/22   Bonnielee Haff, MD  promethazine (PHENERGAN) 25 MG suppository Place 1 suppository (25 mg total) rectally every 6 (six) hours as needed for nausea or vomiting. 10/21/22   Clark, Meghan R, PA  QUEtiapine (SEROQUEL) 300 MG tablet Take 300 mg by mouth at bedtime. 01/08/21   [provider]  rosuvastatin (CRESTOR) 20 MG tablet Take 1 tablet (20 mg total) by mouth daily. 06/09/21 10/16/22  Cantwell, Celeste C, PA-C  Semaglutide, 2 MG/DOSE, 8 MG/3ML SOPN Inject 2 mg as directed once a week. 08/18/22   Elayne Snare, MD  sucralfate (CARAFATE) 1 GM/10ML suspension Take 10 mLs by mouth 2 (two) times daily as needed (abdomen pain). 12/09/20   [provider]  topiramate (TOPAMAX) 25 MG tablet Take 75 mg by mouth at bedtime. 03/17/16   [provider]  torsemide (DEMADEX) 20 MG tablet Take 1 tablet (20 mg total) by mouth at bedtime. 06/09/21   Cantwell,  Celeste C, PA-C  TRANSDERM-SCOP 1 MG/3DAYS Place 1 patch (1.5 mg total) onto the skin every 3 (three) days. As needed for nausea 10/21/22   Theressa Stamps R, PA  Vitamin D, Ergocalciferol, (DRISDOL) 1.25 MG (50000 UNIT) CAPS capsule Take 50,000 Units by mouth once a week. 09/20/22   [provider]     Vital Signs: BP (!) 115/59 (BP Location: Right Wrist)   Pulse 91   Temp 98.6 F (37 C) (Oral)   Resp 17   Ht 5\' 8"  (1.727 m)   Wt (!) 339 lb 8.1 oz (154 kg)   LMP 03/24/2017   SpO2 99%   BMI 51.62 kg/m   Physical Exam NAD, alert Abdomen: soft, tender at drain insertion site. LLQ drain in place.  Flushes and aspirates easily.  Insertion site I/c/d.  Small amount of purulent, thick yellow fluid.  Imaging: CT GUIDED PERITONEAL/RETROPERITONEAL FLUID DRAIN BY PERC CATH  Result Date: 10/25/2022 CLINICAL DATA:  Left tubo-ovarian abscess EXAM: CT GUIDED DRAINAGE OF /PELVIC ABSCESS ANESTHESIA/SEDATION: Intravenous Fentanyl 131mcg administered as pain control during continuous monitoring of the patient's level of consciousness and physiological / cardiorespiratory status by the radiology RN, with a total moderate sedation time of 26 minutes. PROCEDURE: The procedure, risks, benefits, and alternatives were explained to the patient. Questions regarding the procedure were encouraged and answered. The patient understands and consents to the procedure. Select axial scans through the lower abdomen obtained. The left pelvic collection was localized and an appropriate skin entry site determined and marked. The operative field was prepped with chlorhexidinein a sterile fashion, and a sterile drape was applied covering the operative field. A sterile gown and sterile gloves were used for the procedure. Local anesthesia was provided with 1% Lidocaine. Under CT fluoroscopic guidance, 18 gauge trocar needle advanced into the collection. Purulent material could be aspirated. Amplatz wire advanced easily,  position confirmed on CT. Tract dilated to facilitate placement of 12 French pigtail catheter, formed centrally within the collection. CT confirmed good positioning. Catheter secured externally with 0 Prolene suture and StatLock. 60 mL purulent material were aspirated, sent for Gram stain and culture. Catheter was flushed and placed to gravity drain bag. The patient tolerated the procedure well. COMPLICATIONS: None immediate FINDINGS: Left tubo-ovarian fluid collection was localized. 12 French pigtail drain catheter placed as above. 60 mL purulent fluid aspirated, sent for Gram stain and culture. IMPRESSION: 1. Technically successful CT-guided left pelvic abscess drain catheter placement. RADIATION DOSE REDUCTION: This exam was performed according to the departmental dose-optimization program which includes automated exposure control, adjustment of the mA and/or kV according to patient size and/or use of iterative reconstruction technique. Electronically Signed   By: Lucrezia Europe M.D.   On: 10/25/2022 15:56   ECHOCARDIOGRAM COMPLETE  Result Date: 10/24/2022    ECHOCARDIOGRAM REPORT   Patient Name:   [name] Date of Exam: 10/24/2022 Medical Rec #:  [id number]     Height:       68.0 in Accession #:    [id number]    Weight:       339.5 lb Date of Birth:  Apr 19, 1971     BSA:          2.559 m Patient Age:    52 years      BP:           134/71 mmHg Patient Gender: F             HR:           96 bpm. Exam Location:  Inpatient Procedure: 2D Echo, Cardiac Doppler, Color Doppler and Intracardiac            Opacification Agent Indications:    Syncope  History:        Patient has prior history of Echocardiogram examinations, most                 recent 05/21/2021. CHF, Stroke, Signs/Symptoms:Syncope; Risk                 Factors:Hypertension, Diabetes and Dyslipidemia.  Sonographer:    Wenda Low Referring Phys: [id number] TIMOTHY S OPYD  Sonographer Comments: Technically difficult study due to poor echo windows and  patient is obese. IMPRESSIONS  1. Left ventricular ejection fraction, by estimation, is 55 to 60%. The left ventricle has normal  function. Left ventricular endocardial border not optimally defined to evaluate regional wall motion. There is mild concentric left ventricular hypertrophy. Left ventricular diastolic parameters are indeterminate.  2. Right ventricular systolic function is normal. The right ventricular size is normal. There is normal pulmonary artery systolic pressure.  3. The mitral valve was not well visualized. Trivial mitral valve regurgitation. No evidence of mitral stenosis.  4. The aortic valve is tricuspid. There is mild calcification of the aortic valve. Aortic valve regurgitation is not visualized. Aortic valve sclerosis/calcification is present, without any evidence of aortic stenosis. Aortic valve area, by VTI measures  2.08 cm. Aortic valve mean gradient measures 5.0 mmHg. Aortic valve Vmax measures 1.46 m/s.  5. The inferior vena cava is normal in size with greater than 50% respiratory variability, suggesting right atrial pressure of 3 mmHg.  6. Very difficult images due to poor sound wave transmission. LVEF appears normal but endocardium not well seen. FINDINGS  Left Ventricle: Left ventricular ejection fraction, by estimation, is 55 to 60%. The left ventricle has normal function. Left ventricular endocardial border not optimally defined to evaluate regional wall motion. Definity contrast agent was given IV to delineate the left ventricular endocardial borders. The left ventricular internal cavity size was normal in size. There is mild concentric left ventricular hypertrophy. Left ventricular diastolic parameters are indeterminate. Right Ventricle: The right ventricular size is normal. No increase in right ventricular wall thickness. Right ventricular systolic function is normal. There is normal pulmonary artery systolic pressure. The tricuspid regurgitant velocity is 2.33 m/s, and  with an  assumed right atrial pressure of 8 mmHg, the estimated right ventricular systolic pressure is [id number] mmHg. Left Atrium: Left atrial size was not well visualized. Right Atrium: Right atrial size was not well visualized. Pericardium: Trivial pericardial effusion is present. The pericardial effusion is anterior to the right ventricle. Mitral Valve: The mitral valve was not well visualized. Trivial mitral valve regurgitation. No evidence of mitral valve stenosis. MV peak gradient, 2.0 mmHg. The mean mitral valve gradient is 1.0 mmHg. Tricuspid Valve: The tricuspid valve is normal in structure. Tricuspid valve regurgitation is trivial. No evidence of tricuspid stenosis. Aortic Valve: The aortic valve is tricuspid. There is mild calcification of the aortic valve. Aortic valve regurgitation is not visualized. Aortic valve sclerosis/calcification is present, without any evidence of aortic stenosis. Aortic valve mean gradient measures 5.0 mmHg. Aortic valve peak gradient measures 8.5 mmHg. Aortic valve area, by VTI measures 2.08 cm. Pulmonic Valve: The pulmonic valve was normal in structure. Pulmonic valve regurgitation is trivial. No evidence of pulmonic stenosis. Aorta: The aortic root is normal in size and structure. Venous: The inferior vena cava is normal in size with greater than 50% respiratory variability, suggesting right atrial pressure of 3 mmHg. IAS/Shunts: No atrial level shunt detected by color flow Doppler.  LEFT VENTRICLE PLAX 2D LVIDd:         4.90 cm LVIDs:         3.30 cm LV PW:         1.30 cm LV IVS:        1.10 cm LVOT diam:     2.10 cm LV SV:         50 LV SV Index:   20 LVOT Area:     3.46 cm  LEFT ATRIUM         Index LA diam:    3.10 cm 1.21 cm/m  AORTIC VALVE  PULMONIC VALVE AV Area (Vmax):    1.97 cm     PV Vmax:       0.88 m/s AV Area (Vmean):   1.72 cm     PV Peak grad:  3.1 mmHg AV Area (VTI):     2.08 cm AV Vmax:           146.00 cm/s AV Vmean:          99.100 cm/s AV  VTI:            0.241 m AV Peak Grad:      8.5 mmHg AV Mean Grad:      5.0 mmHg LVOT Vmax:         82.90 cm/s LVOT Vmean:        49.200 cm/s LVOT VTI:          0.145 m LVOT/AV VTI ratio: 0.60  AORTA Ao Root diam: 3.30 cm MITRAL VALVE               TRICUSPID VALVE MV Area (PHT): 4.12 cm    TR Peak grad:   21.7 mmHg MV Area VTI:   3.12 cm    TR Vmax:        233.00 cm/s MV Peak grad:  2.0 mmHg MV Mean grad:  1.0 mmHg    SHUNTS MV Vmax:       0.70 m/s    Systemic VTI:  0.14 m MV Vmean:      45.6 cm/s   Systemic Diam: 2.10 cm MV Decel Time: 184 msec MV E velocity: 57.00 cm/s MV A velocity: 62.10 cm/s MV E/A ratio:  0.92 Glori Bickers MD Electronically signed by Glori Bickers MD Signature Date/Time: 10/24/2022/4:34:36 PM    Final    EEG adult  Result Date: 10/24/2022 Lora Havens, MD     10/24/2022 10:12 AM Patient Name: [name] MRN: [id number] Epilepsy Attending: Lora Havens Referring Physician/Provider: Vianne Bulls, MD Date: 10/24/2022 Duration: 25.25 mins Patient history: 52 year old female with syncope in the setting of pelvic infection. EEG to evaluate for seizure Level of alertness: lethargic AEDs during EEG study: PGB, TPM Technical aspects: This EEG study was done with scalp electrodes positioned according to the 10-20 International system of electrode placement. Electrical activity was reviewed with band pass filter of 1-70Hz , sensitivity of 7 uV/mm, display speed of 39mm/sec with a 60Hz  notched filter applied as appropriate. EEG data were recorded continuously and digitally stored.  Video monitoring was available and reviewed as appropriate. Description: The posterior dominant rhythm consists of 8 Hz activity of moderate voltage (25-35 uV) seen predominantly in posterior head regions, symmetric and reactive to eye opening and eye closing. EEG showed intermittent generalized 3 to 6 Hz theta-delta slowing. Physiologic photic driving was not seen during photic stimulation.   Hyperventilation was not performed.   ABNORMALITY - Intermittent slow, generalized IMPRESSION: This study is suggestive of mild diffuse encephalopathy, nonspecific etiology. No seizures or epileptiform discharges were seen throughout the recording. Please note lack of epileptiform activity during interictal EEG does not exclude the diagnosis of epilepsy. Lora Havens   US Pelvis Complete  Result Date: 10/24/2022 CLINICAL DATA:  Infection and pain. EXAM: TRANSABDOMINAL ULTRASOUND OF PELVIS TECHNIQUE: Transabdominal ultrasound examination of the pelvis was performed including evaluation of the uterus, ovaries, adnexal regions, and pelvic cul-de-sac. COMPARISON:  CT 10/23/2022 FINDINGS: Uterus Uterus is not visualized. Endometrium Not visualized. Right ovary Not visualized. Left ovary Not visualized. Other findings: Examination is  technically limited due to patient's body habitus and incomplete filling of the bladder as well as bowel gas. IMPRESSION: Technically limited examination. Uterus, ovaries, and adnexal structures are not visualized. Electronically Signed   By: Lucienne Capers M.D.   On: 10/24/2022 01:02   CT ABDOMEN PELVIS W CONTRAST  Result Date: 10/23/2022 CLINICAL DATA:  Abdominal pain. EXAM: CT ABDOMEN AND PELVIS WITH CONTRAST TECHNIQUE: Multidetector CT imaging of the abdomen and pelvis was performed using the standard protocol following bolus administration of intravenous contrast. RADIATION DOSE REDUCTION: This exam was performed according to the departmental dose-optimization program which includes automated exposure control, adjustment of the mA and/or kV according to patient size and/or use of iterative reconstruction technique. CONTRAST:  51mL OMNIPAQUE IOHEXOL 350 MG/ML SOLN COMPARISON:  CT abdomen pelvis dated 10/14/2022. FINDINGS: Lower chest: Minimal bibasilar atelectasis. The visualized lung bases are otherwise clear. No intra-abdominal free air.  Trace free fluid in the pelvis.  Hepatobiliary: The liver is unremarkable. No biliary dilatation. The gallbladder is unremarkable Pancreas: Unremarkable. No pancreatic ductal dilatation or surrounding inflammatory changes. Spleen: Normal in size without focal abnormality. Adrenals/Urinary Tract: The adrenal glands are unremarkable there is no hydronephrosis on either side. There is symmetric excretion of contrast by both kidneys. The visualized ureters appear unremarkable. The urinary bladder is collapsed. Stomach/Bowel: There is a small hiatal hernia. There is no bowel obstruction or active inflammation. The appendix is normal. Vascular/Lymphatic: The abdominal aorta and IVC are unremarkable. No portal venous gas. There is no adenopathy. Reproductive: The uterus appears anteverted with poorly visualized. There is diffuse inflammatory changes and stranding of the pelvic fat, new since the prior CT. There is a partially organized low attenuating collection in the left hemipelvis measuring approximately 5 x 5 cm in greatest axial dimensions concerning for developing abscess. Small amount of high density fluid may represent complex fluid or blood product. Findings may represent pelvic inflammatory disease with developing abscesses. Clinical correlation and further evaluation with pelvic ultrasound recommended. Other: None Musculoskeletal: No acute or significant osseous findings. IMPRESSION: 1. Diffuse inflammatory changes and stranding of the pelvic fat, new since the prior CT. Findings may represent an inflammatory/infectious process such as PID with developing abscess. A small amount of hemorrhagic content may also be present in the pelvis and therefore the possibility of a ruptured hemorrhagic cyst, or trauma is not excluded. No drainable fluid collection/abscess at this time. Clinical correlation and further evaluation with pelvic ultrasound recommended. 2. No bowel obstruction. Normal appendix. Electronically Signed   By: Anner Crete M.D.    On: 10/23/2022 22:08   CT ANGIO HEAD NECK W WO CM (CODE STROKE)  Result Date: 10/23/2022 CLINICAL DATA:  Initial evaluation for stroke. EXAM: CT ANGIOGRAPHY HEAD AND NECK TECHNIQUE: Multidetector CT imaging of the head and neck was performed using the standard protocol during bolus administration of intravenous contrast. Multiplanar CT image reconstructions and MIPs were obtained to evaluate the vascular anatomy. Carotid stenosis measurements (when applicable) are obtained utilizing NASCET criteria, using the distal internal carotid diameter as the denominator. RADIATION DOSE REDUCTION: This exam was performed according to the departmental dose-optimization program which includes automated exposure control, adjustment of the mA and/or kV according to patient size and/or use of iterative reconstruction technique. CONTRAST:  24mL OMNIPAQUE IOHEXOL 350 MG/ML SOLN COMPARISON:  CT from earlier the same day as well as prior exam from 02/18/2021. FINDINGS: CTA NECK FINDINGS Aortic arch: Visualized aortic arch normal caliber with standard branch pattern. No stenosis about the origin the  great vessels. Right carotid system: Right common and internal carotid arteries widely patent without stenosis, dissection or occlusion. Left carotid system: Left common and internal carotid arteries widely patent without stenosis, dissection or occlusion. Vertebral arteries: Both vertebral arteries arise from subclavian arteries. No proximal subclavian artery stenosis. Vertebral arteries are markedly hypoplastic but patent without stenosis or dissection. Skeleton: No discrete or worrisome osseous lesions. Moderate spondylosis present at C4-5 through C6-7. Other neck: No other acute soft tissue abnormality within the neck. Few punctate sialoliths noted within the left parotid gland. Upper chest: Visualized upper chest demonstrates no acute finding. Review of the MIP images confirms the above findings CTA HEAD FINDINGS Anterior  circulation: Both internal carotid arteries are patent to the termini without stenosis. Persistent trigeminal artery noted on the right. A1 segments, anterior communicating complex common anterior cerebral arteries patent without stenosis. No M1 stenosis or occlusion. No proximal MCA branch occlusion or high-grade stenosis. Distal MCA branches perfused and symmetric. Posterior circulation: Both V4 segments patent without stenosis. Both PICA patent. Proximal basilar artery is diminutive but patent. Basilar more normal in caliber distal to the persistent right trigeminal artery. Superior cerebellar and posterior cerebral arteries patent bilaterally. Venous sinuses: Patent allowing for timing the contrast bolus. Anatomic variants: Persistent trigeminal artery with diminutive vertebrobasilar system. Review of the MIP images confirms the above findings IMPRESSION: 1. Negative CTA of the head and neck. No large vessel occlusion or other emergent finding. 2. 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 These results were communicated to Dr. Leonel Ramsay at 9:59 pm on 10/23/2022 by text page via the [facility] messaging system. Electronically Signed   By: Jeannine Boga M.D.   On: 10/23/2022 22:02   CT HEAD CODE STROKE WO CONTRAST  Result Date: 10/23/2022 CLINICAL DATA:  Code stroke. EXAM: CT HEAD WITHOUT CONTRAST TECHNIQUE: Contiguous axial images were obtained from the base of the skull through the vertex without intravenous contrast. RADIATION DOSE REDUCTION: This exam was performed according to the departmental dose-optimization program which includes automated exposure control, adjustment of the mA and/or kV according to patient size and/or use of iterative reconstruction technique. COMPARISON:  None Available. FINDINGS: Brain: Cerebral volume within normal limits. No acute intracranial hemorrhage. No acute large vessel territory infarct. No mass lesion, midline shift or  mass effect. No hydrocephalus or extra-axial fluid collection. Vascular: No abnormal hyperdense vessel. Skull: Scalp soft tissues and calvarium within normal limits. Sinuses/Orbits: Left a spur [place] noted. Few small retention cyst noted within the left maxillary sinus. Mild mucosal thickening noted about the ethmoidal air cells. No mastoid effusion. Other: None. ASPECTS [facility] Stroke Program Early CT Score) - Ganglionic level infarction (caudate, lentiform nuclei, internal capsule, insula, M1-M3 cortex): 7 - Supraganglionic infarction (M4-M6 cortex): 3 Total score (0-10 with 10 being normal): 10 IMPRESSION: 1. No acute intracranial abnormality. 2. ASPECTS is 10. These results were communicated to Dr. Leonel Ramsay at 9:17 pm on 10/23/2022 by text page via the [facility] messaging system. Electronically Signed   By: Jeannine Boga M.D.   On: 10/23/2022 21:18    Labs:  CBC: Recent Labs    10/23/22 2110 10/24/22 0643 10/25/22 0722 10/26/22 0406  WBC 22.5* 31.1* 34.6* 24.0*  HGB 11.3*  12.2 10.7* 9.8* 9.9*  HCT 34.2*  36.0 32.2* 29.5* 29.0*  PLT 305 322 285 277    COAGS: Recent Labs    10/23/22 2110  INR 1.2  APTT 26    BMP: Recent Labs    10/23/22 2110 10/24/22  [id number] 10/25/22 0722 10/26/22 0406  NA 135  137 134* 136 135  K 3.7  3.7 3.7 3.9 3.8  CL 104  106 106 103 105  CO2 18* 17* 17* 19*  GLUCOSE 202*  206* 196* 111* 150*  BUN 7  8 9 18  25*  CALCIUM 9.0 8.6* 8.4* 8.2*  CREATININE 1.02*  0.80 0.97 2.29* 1.92*  GFRNONAA >60 >60 25* 31*    LIVER FUNCTION TESTS: Recent Labs    10/17/22 0412 10/21/22 1648 10/23/22 2110 10/26/22 0406  BILITOT 0.8 0.7 0.9 1.3*  AST 10* 16 13* 17  ALT 8 11 10 14   ALKPHOS 85 112 111 105  PROT 6.0* 6.9 6.9 5.8*  ALBUMIN 2.8* 2.9* 2.9* 1.9*    Assessment and Plan: Intra-abdominal fluid collection s/p LLQ drain placement 3/26 by Dr. Vernard Gambles.   Drain Location: LLQ Size: Fr size: 12 Fr Date of placement: 3/26  Currently to:  Drain collection device: gravity 24 hour output:  Output by Drain (mL) 10/24/22 0701 - 10/24/22 1900 10/24/22 1901 - 10/25/22 0700 10/25/22 0701 - 10/25/22 1900 10/25/22 1901 - 10/26/22 0700 10/26/22 0701 - 10/26/22 1615  Closed System Drain 1 Left LLQ Other (Comment) 12 Fr.   60 100 0    Plan: Continue TID flushes with 5 cc NS. Record output Q shift. Dressing changes QD or PRN if soiled.  Call IR APP or on call IR MD if difficulty flushing or sudden change in drain output.  Repeat imaging/possible drain injection once output < 10 mL/QD (excluding flush material). Consideration for drain removal if output is < 10 mL/QD (excluding flush material), pending discussion with the providing surgical service.  Discharge planning: Please contact IR APP or on call IR MD prior to patient d/c to ensure appropriate follow up plans are in place.   IR will continue to follow - please call with questions or concerns.  Electronically Signed: Docia Barrier, PA 10/26/2022, 3:51 PM   I spent a total of 15 Minutes at the the patient's bedside AND on the patient's hospital floor or unit, greater than 50% of which was counseling/coordinating care for intra-abdominal abscess.

## 2022-10-26 NOTE — Plan of Care (Signed)
  Problem: Coping: Goal: Ability to adjust to condition or change in health will improve Outcome: Not Progressing   Problem: Fluid Volume: Goal: Ability to maintain a balanced intake and output will improve Outcome: Not Progressing   Problem: Nutritional: Goal: Maintenance of adequate nutrition will improve Outcome: Not Progressing Goal: Progress toward achieving an optimal weight will improve Outcome: Not Progressing   Problem: Education: Goal: Knowledge of General Education information will improve Description: Including pain rating scale, medication(s)/side effects and non-pharmacologic comfort measures Outcome: Not Progressing   Problem: Health Behavior/Discharge Planning: Goal: Ability to manage health-related needs will improve Outcome: Not Progressing

## 2022-10-26 NOTE — Progress Notes (Signed)
[facility] Progress Note Patient: [name] [id number] DOB: 01-28-1971 DOA: 10/23/2022  DOS: the patient was seen and examined on 10/26/2022  Brief hospital course: 52 year old African-American female with past medical history of morbid obesity with BMI greater than 50, systolic/diastolic CHF with previous ejection fraction of 30 to 35% and grade 1 diastolic dysfunction, chronic right ventricular failure, history of DVT, hypothyroidism, depression and anxiety and chronic abdominal pain who presented to the emergency room on 3/24 with complaints of lightheadedness and abdominal pain.  She was also found to be somewhat confused.  Workup revealed questionable intra-abdominal abscess concerning for PID/TOA and case discussed with gynecology.  Mass not present on CT on 3/15.  Interventional radiology consulted and patient underwent drain placement 3/26.  Mentation has been slowly improving.  Assessment and Plan: Syncope. Acute metabolic encephalopathy/hypersomnolence. Initially presented with what appears to be a syncopal event. There was a concern for seizures, however EEG was unremarkable.  Ammonia level, TSH, HIV, RPR all unremarkable.  VBG negative for hypercarbia.  Neurology evaluated and felt findings unrelated to any neurological issue.  Severe sepsis secondary to intra-abdominal abscess in patient with acute on chronic abdominal pain. Patient met criteria for severe sepsis given fever, leukocytosis, AKI creatinine greater than 2 and abdominal source. CT abdomen shows evidence of a 5 x 5 cm fluid collection in the left hemipelvis concerning for developing abscess.  GYN was consulted.  Discussed with Dr. Roselie Awkward. She has not had sex in 4 years and the mass was not present on CT 3/15.  GYN does not think that the patient is suffering from a tubo-ovarian abscess. Patient underwent IR guided drain placement on 3/26. Due to ongoing worsening leukocytosis as well as fever despite being on  antibiotics I will change it to IV meropenem and IV doxycycline.  White blood cell count with improvement.  AKI. Suspect ATN in the setting of sepsis, possibility of contrast-induced injury as well as prerenal etiology in the setting of poor p.o. intake.  Improving with IV fluids. Initiate with IV fluid.  Monitor closely due to history of CHF.  Hypersomnolence. OHS. Obesity class III. Body mass index is 51.62 kg/m.  Placing the patient at high risk of poor outcome. Patient was recommended to have an outpatient sleep evaluation during her recent hospitalization.  While VBG does not show any evidence of hypercarbia will monitor clinically. Speech therapy consulted.  Recommend dysphagia to solid when patient feels to tolerate oral diet. Currently on full liquid diet. Currently minimizing psychotropic medications.  Discontinue Dilaudid.  Chronic combined systolic and diastolic CHF. Echocardiogram done during this hospitalization notes much improvement in ejection fraction and indeterminate diastolic function.  Will check BMI in the morning.  Gastroparesis. Patient is not on any medication for gastroparesis. QTc is prolonged. Currently on Compazine. Will resume home scopolamine patch.  History of PE. On Eliquis at home which I will continue for now.  Although, at times due to patient's hypersomnolence, not awake enough to take p.o.  Type 2 diabetes mellitus, uncontrolled with hyperglycemia with gastroparesis. With long-term insulin use. Patient is actually on U-500 insulin at home. Due to n.p.o. status currently on once a day regimen of long-acting insulin. Continue sliding scale insulin.   Lupus and Sjogren's syndrome. Patient is on hydroxychloroquine, belimumab as well as Imuran. Patient remains at risk for infections. Monitor for now.  Anxiety and depression. Continue home regimen.  Hypothyroidism. Continue Synthroid.  Overactive bladder. On Ditropan.  Currently  had.  GERD. Continue PPI.  Diabetic neuropathy. On Lyrica. Due to encephalopathy we will hold it for now.  Hyperlipidemia. Continue Crestor.  Oral candidiasis. Will treat with Diflucan.  Subjective: Patient still quite drowsy  Physical Exam: General drowsy, able to stay awake briefly Cardiovascular: Regular rate and rhythm, S1-S2 Lungs: Decreased breath sounds throughout secondary to body habitus Abdomen: Soft, obese, generalized nonspecific tenderness, hypoactive bowel sounds Extremities: No clubbing or cyanosis, trace pitting edema  Data Reviewed: White blood cell count down to 19, albumin of 1.9, creatinine 1.92  Disposition: Status is: Inpatient Remains inpatient appropriate because: -IV antibiotics and improvement in infection -Improvement in hypersomnolence  apixaban (ELIQUIS) tablet 2.5 mg Start: 10/25/22 2200 Place and maintain sequential compression device Start: 10/25/22 1103 apixaban (ELIQUIS) tablet 2.5 mg   Family Communication: Will call family Level of care: Telemetry Medical   Vitals:   10/26/22 0300 10/26/22 0445 10/26/22 0700 10/26/22 1136  BP: (!) 90/52 102/63 96/60 (!) 115/59  Pulse: 94 93 94 91  Resp: 16 13 15 17   Temp: 99.3 F (37.4 C)  99.1 F (37.3 C) 98.6 F (37 C)  TempSrc: Oral  Oral Oral  SpO2: 98% 98% 98% 99%  Weight:      Height:         Author: Annita Brod, MD 10/26/2022 2:15 PM  Please look on [email or web address] to find out who is on call.

## 2022-10-26 NOTE — Progress Notes (Signed)
PHARMACY NOTE:  ANTIMICROBIAL RENAL DOSAGE ADJUSTMENT  Current antimicrobial regimen includes a mismatch between antimicrobial dosage and estimated renal function.  As per policy approved by the Pharmacy & Therapeutics and Medical Executive Committees, the antimicrobial dosage will be adjusted accordingly.  Current antimicrobial dosage:  Meropenem 2gm IV q12h  Indication: IAI, pelvic abscess  Renal Function:  Estimated Creatinine Clearance: 54.1 mL/min (A) (by C-G formula based on SCr of 1.92 mg/dL (H)). []      On intermittent HD, scheduled: []      On CRRT    Antimicrobial dosage has been changed to:  Meropenem 2gm Iv q8h  Additional comments:   Allure Greaser A. Levada Dy, PharmD, BCPS, FNKF Clinical Pharmacist [place] Please utilize Amion for appropriate phone number to reach the unit pharmacist ([place])  10/26/2022 2:08 PM

## 2022-10-26 NOTE — Plan of Care (Signed)

## 2022-10-26 NOTE — Progress Notes (Signed)
   10/25/22 2055  Assess: MEWS Score  Temp (!) 101.3 F (38.5 C)  BP 102/62  MAP (mmHg) 71  Pulse Rate (!) 102  ECG Heart Rate (!) 101  Resp 19  Level of Consciousness Responds to Voice  SpO2 95 %  O2 Device Room Air  Patient Activity (if Appropriate) In bed  Assess: MEWS Score  MEWS Temp 1  MEWS Systolic 0  MEWS Pulse 1  MEWS RR 0  MEWS LOC 1  MEWS Score 3  MEWS Score Color Yellow  Assess: if the MEWS score is Yellow or Red  Were vital signs taken at a resting state? Yes  Focused Assessment No change from prior assessment  Does the patient meet 2 or more of the SIRS criteria? Yes  Does the patient have a confirmed or suspected source of infection? Yes  Provider and Rapid Response Notified? Yes  MEWS guidelines implemented  Yes, yellow  Treat  MEWS Interventions Considered administering scheduled or prn medications/treatments as ordered  Take Vital Signs  Increase Vital Sign Frequency  Yellow: Q2hr x1, continue Q4hrs until patient remains green for 12hrs  Escalate  MEWS: Escalate Yellow: Discuss with charge nurse and consider notifying provider and/or RRT  Notify: Charge Nurse/RN  Name of Charge Nurse/RN Notified [place] RN  Provider Notification  Provider Name/Title Dr. Velia Meyer  Date Provider Notified 10/25/22  Time Provider Notified 2112  Method of Notification Page  Notification Reason Other (Comment) (red mews. temp 102.2, lethargic. had abd drain placed today for abscess. abx changed today. on LR at 75/hr.)  Provider response See new orders  Date of Provider Response 10/25/22  Time of Provider Response 2316  Notify: Rapid Response  Name of Rapid Response RN Notified David RN  Date Rapid Response Notified 10/25/22  Assess: SIRS CRITERIA  SIRS Temperature  1  SIRS Pulse 1  SIRS Respirations  0  SIRS WBC 0  SIRS Score Sum  2

## 2022-10-27 DIAGNOSIS — G9340 Encephalopathy, unspecified: Secondary | ICD-10-CM | POA: Diagnosis not present

## 2022-10-27 DIAGNOSIS — K651 Peritoneal abscess: Secondary | ICD-10-CM | POA: Diagnosis not present

## 2022-10-27 DIAGNOSIS — I5022 Chronic systolic (congestive) heart failure: Secondary | ICD-10-CM | POA: Diagnosis not present

## 2022-10-27 DIAGNOSIS — A419 Sepsis, unspecified organism: Secondary | ICD-10-CM | POA: Diagnosis not present

## 2022-10-27 LAB — PROCALCITONIN: Procalcitonin: 1.8 ng/mL

## 2022-10-27 LAB — GLUCOSE, CAPILLARY
Glucose-Capillary: 100 mg/dL — ABNORMAL HIGH (ref 70–99)
Glucose-Capillary: 100 mg/dL — ABNORMAL HIGH (ref 70–99)
Glucose-Capillary: 102 mg/dL — ABNORMAL HIGH (ref 70–99)
Glucose-Capillary: 113 mg/dL — ABNORMAL HIGH (ref 70–99)
Glucose-Capillary: 131 mg/dL — ABNORMAL HIGH (ref 70–99)
Glucose-Capillary: 84 mg/dL (ref 70–99)
Glucose-Capillary: 86 mg/dL (ref 70–99)
Glucose-Capillary: 94 mg/dL (ref 70–99)
Glucose-Capillary: 97 mg/dL (ref 70–99)

## 2022-10-27 LAB — COMPREHENSIVE METABOLIC PANEL
ALT: 13 U/L (ref 0–44)
AST: 15 U/L (ref 15–41)
Albumin: 2 g/dL — ABNORMAL LOW (ref 3.5–5.0)
Alkaline Phosphatase: 124 U/L (ref 38–126)
Anion gap: 11 (ref 5–15)
BUN: 22 mg/dL — ABNORMAL HIGH (ref 6–20)
CO2: 19 mmol/L — ABNORMAL LOW (ref 22–32)
Calcium: 8.4 mg/dL — ABNORMAL LOW (ref 8.9–10.3)
Chloride: 107 mmol/L (ref 98–111)
Creatinine, Ser: 1.29 mg/dL — ABNORMAL HIGH (ref 0.44–1.00)
GFR, Estimated: 50 mL/min — ABNORMAL LOW (ref >60)
Glucose, Bld: 87 mg/dL (ref 70–99)
Potassium: 3.5 mmol/L (ref 3.5–5.1)
Sodium: 137 mmol/L (ref 135–145)
Total Bilirubin: 1.2 mg/dL (ref 0.3–1.2)
Total Protein: 6 g/dL — ABNORMAL LOW (ref 6.5–8.1)

## 2022-10-27 LAB — CBC
HCT: 28.1 % — ABNORMAL LOW (ref 36.0–46.0)
Hemoglobin: 9.5 g/dL — ABNORMAL LOW (ref 12.0–15.0)
MCH: 29 pg (ref 26.0–34.0)
MCHC: 33.8 g/dL (ref 30.0–36.0)
MCV: 85.7 fL (ref 80.0–100.0)
Platelets: 329 10*3/uL (ref 150–400)
RBC: 3.28 MIL/uL — ABNORMAL LOW (ref 3.87–5.11)
RDW: 13.3 % (ref 11.5–15.5)
WBC: 22.8 10*3/uL — ABNORMAL HIGH (ref 4.0–10.5)
nRBC: 0 % (ref 0.0–0.2)

## 2022-10-27 LAB — BRAIN NATRIURETIC PEPTIDE: B Natriuretic Peptide: 81.8 pg/mL (ref 0.0–100.0)

## 2022-10-27 MED ORDER — HYDROXYZINE HCL 10 MG PO TABS
10.0000 mg | ORAL_TABLET | Freq: Three times a day (TID) | ORAL | Status: DC | PRN
  Administered 2022-10-27 – 2022-11-19 (×20): 10 mg via ORAL
  Filled 2022-10-27 (×22): qty 1

## 2022-10-27 MED ORDER — ONDANSETRON HCL 4 MG/2ML IJ SOLN
4.0000 mg | Freq: Four times a day (QID) | INTRAMUSCULAR | Status: DC | PRN
  Administered 2022-10-27 – 2022-11-19 (×64): 4 mg via INTRAVENOUS
  Filled 2022-10-27 (×64): qty 2

## 2022-10-27 MED ORDER — ONDANSETRON HCL 4 MG/2ML IJ SOLN
4.0000 mg | Freq: Once | INTRAMUSCULAR | Status: AC
  Administered 2022-10-27: 4 mg via INTRAVENOUS
  Filled 2022-10-27: qty 2

## 2022-10-27 NOTE — Progress Notes (Cosign Needed)
Referring Physician(s): Dr. Berle Mull  Supervising Physician: Markus Daft  Patient Status:  [facility] - In-pt  Chief Complaint: Intra-abdominal fluid collection  Subjective: Patient lying in bed.  Still reports some pain at drain site. Also some nausea, but says she thinks it was her lunch. VSS.   Allergies: Metoclopramide, Codeine, Hydrocodone, Penicillins, and Percocet [oxycodone-acetaminophen]  Medications:  Current Facility-Administered Medications:    0.9 %  sodium chloride infusion, , Intravenous, Continuous, Annita Brod, MD, Last Rate: 100 mL/hr at 10/26/22 1148, New Bag at 10/26/22 1148   acetaminophen (TYLENOL) tablet 650 mg, 650 mg, Oral, Q6H PRN, 650 mg at 10/24/22 0458 **OR** acetaminophen (TYLENOL) suppository 650 mg, 650 mg, Rectal, Q6H PRN, Opyd, Ilene Qua, MD, 650 mg at 10/25/22 2121   apixaban (ELIQUIS) tablet 2.5 mg, 2.5 mg, Oral, BID, Lavina Hamman, MD, 2.5 mg at 10/27/22 1027   carvedilol (COREG) tablet 3.125 mg, 3.125 mg, Oral, BID WC, Lavina Hamman, MD, 3.125 mg at 10/27/22 1027   doxycycline (VIBRAMYCIN) 100 mg in sodium chloride 0.9 % 250 mL IVPB, 100 mg, Intravenous, Q12H, Hammons, Kimberly B, RPH, Last Rate: 125 mL/hr at 10/27/22 1039, 100 mg at 10/27/22 1039   [COMPLETED] fluconazole (DIFLUCAN) tablet 200 mg, 200 mg, Oral, Daily, 200 mg at 10/25/22 1718 **FOLLOWED BY** fluconazole (DIFLUCAN) tablet 100 mg, 100 mg, Oral, Daily, Hammons, Kimberly B, RPH, 100 mg at 10/27/22 1028   HYDROmorphone (DILAUDID) injection 0.5 mg, 0.5 mg, Intravenous, Q4H PRN, Annita Brod, MD, 0.5 mg at 10/27/22 1425   hydrOXYzine (ATARAX) tablet 10 mg, 10 mg, Oral, TID PRN, Mansy, Jan A, MD, 10 mg at 10/27/22 1229   insulin aspart (novoLOG) injection 0-9 Units, 0-9 Units, Subcutaneous, Q4H, Opyd, Ilene Qua, MD, 1 Units at 10/27/22 0038   insulin glargine-yfgn (SEMGLEE) injection 100 Units, 100 Units, Subcutaneous, Daily, Lavina Hamman, MD, 100 Units at 10/27/22  1042   levothyroxine (SYNTHROID) tablet 50 mcg, 50 mcg, Oral, Q0600, Opyd, Ilene Qua, MD, 50 mcg at 10/27/22 0610   meropenem (MERREM) 2 g in sodium chloride 0.9 % 100 mL IVPB, 2 g, Intravenous, Q8H, Pierce, Dwayne A, RPH, Last Rate: 280 mL/hr at 10/27/22 1326, 2 g at 10/27/22 1326   pantoprazole (PROTONIX) EC tablet 40 mg, 40 mg, Oral, Daily, Opyd, Ilene Qua, MD, 40 mg at 10/27/22 1028   QUEtiapine (SEROQUEL) tablet 200 mg, 200 mg, Oral, QHS, Annita Brod, MD, 200 mg at 10/26/22 2106   senna-docusate (Senokot-S) tablet 1 tablet, 1 tablet, Oral, QHS PRN, Opyd, Ilene Qua, MD, 1 tablet at 10/27/22 1027   sodium chloride flush (NS) 0.9 % injection 3 mL, 3 mL, Intravenous, Q12H, Opyd, Ilene Qua, MD, 3 mL at 10/26/22 2113   sodium chloride flush (NS) 0.9 % injection 5 mL, 5 mL, Intracatheter, Q8H, Arne Cleveland, MD, 5 mL at 10/27/22 1324    Vital Signs: BP 119/83 (BP Location: Right Arm)   Pulse 94   Temp 99 F (37.2 C) (Oral)   Resp (!) 21   Ht 5\' 8"  (1.727 m)   Wt (!) 345 lb 0.3 oz (156.5 kg)   LMP 03/24/2017   SpO2 98%   BMI 52.46 kg/m   Physical Exam Constitutional:      Appearance: She is not ill-appearing.  Abdominal:     Palpations: Abdomen is soft.     Comments: LLQ drain intact, site clean. Mostly serous output but some debris in tubing. Flushes easily.  Neurological:  Mental Status: She is alert and oriented to person, place, and time.  Psychiatric:        Mood and Affect: Mood normal.        Thought Content: Thought content normal.     Imaging: CT GUIDED PERITONEAL/RETROPERITONEAL FLUID DRAIN BY PERC CATH  Result Date: 10/25/2022 CLINICAL DATA:  Left tubo-ovarian abscess EXAM: CT GUIDED DRAINAGE OF /PELVIC ABSCESS ANESTHESIA/SEDATION: Intravenous Fentanyl 134mcg administered as pain control during continuous monitoring of the patient's level of consciousness and physiological / cardiorespiratory status by the radiology RN, with a total moderate sedation  time of 26 minutes. PROCEDURE: The procedure, risks, benefits, and alternatives were explained to the patient. Questions regarding the procedure were encouraged and answered. The patient understands and consents to the procedure. Select axial scans through the lower abdomen obtained. The left pelvic collection was localized and an appropriate skin entry site determined and marked. The operative field was prepped with chlorhexidinein a sterile fashion, and a sterile drape was applied covering the operative field. A sterile gown and sterile gloves were used for the procedure. Local anesthesia was provided with 1% Lidocaine. Under CT fluoroscopic guidance, 18 gauge trocar needle advanced into the collection. Purulent material could be aspirated. Amplatz wire advanced easily, position confirmed on CT. Tract dilated to facilitate placement of 12 French pigtail catheter, formed centrally within the collection. CT confirmed good positioning. Catheter secured externally with 0 Prolene suture and StatLock. 60 mL purulent material were aspirated, sent for Gram stain and culture. Catheter was flushed and placed to gravity drain bag. The patient tolerated the procedure well. COMPLICATIONS: None immediate FINDINGS: Left tubo-ovarian fluid collection was localized. 12 French pigtail drain catheter placed as above. 60 mL purulent fluid aspirated, sent for Gram stain and culture. IMPRESSION: 1. Technically successful CT-guided left pelvic abscess drain catheter placement. RADIATION DOSE REDUCTION: This exam was performed according to the departmental dose-optimization program which includes automated exposure control, adjustment of the mA and/or kV according to patient size and/or use of iterative reconstruction technique. Electronically Signed   By: Lucrezia Europe M.D.   On: 10/25/2022 15:56   ECHOCARDIOGRAM COMPLETE  Result Date: 10/24/2022    ECHOCARDIOGRAM REPORT   Patient Name:   [name] Date of Exam: 10/24/2022 Medical  Rec #:  [id number]     Height:       68.0 in Accession #:    [id number]    Weight:       339.5 lb Date of Birth:  April 18, 1971     BSA:          2.559 m Patient Age:    52 years      BP:           134/71 mmHg Patient Gender: F             HR:           96 bpm. Exam Location:  Inpatient Procedure: 2D Echo, Cardiac Doppler, Color Doppler and Intracardiac            Opacification Agent Indications:    Syncope  History:        Patient has prior history of Echocardiogram examinations, most                 recent 05/21/2021. CHF, Stroke, Signs/Symptoms:Syncope; Risk                 Factors:Hypertension, Diabetes and Dyslipidemia.  Sonographer:    Wenda Low Referring Phys: [phone] [employer]  OPYD  Sonographer Comments: Technically difficult study due to poor echo windows and patient is obese. IMPRESSIONS  1. Left ventricular ejection fraction, by estimation, is 55 to 60%. The left ventricle has normal function. Left ventricular endocardial border not optimally defined to evaluate regional wall motion. There is mild concentric left ventricular hypertrophy. Left ventricular diastolic parameters are indeterminate.  2. Right ventricular systolic function is normal. The right ventricular size is normal. There is normal pulmonary artery systolic pressure.  3. The mitral valve was not well visualized. Trivial mitral valve regurgitation. No evidence of mitral stenosis.  4. The aortic valve is tricuspid. There is mild calcification of the aortic valve. Aortic valve regurgitation is not visualized. Aortic valve sclerosis/calcification is present, without any evidence of aortic stenosis. Aortic valve area, by VTI measures  2.08 cm. Aortic valve mean gradient measures 5.0 mmHg. Aortic valve Vmax measures 1.46 m/s.  5. The inferior vena cava is normal in size with greater than 50% respiratory variability, suggesting right atrial pressure of 3 mmHg.  6. Very difficult images due to poor sound wave transmission. LVEF appears normal  but endocardium not well seen. FINDINGS  Left Ventricle: Left ventricular ejection fraction, by estimation, is 55 to 60%. The left ventricle has normal function. Left ventricular endocardial border not optimally defined to evaluate regional wall motion. Definity contrast agent was given IV to delineate the left ventricular endocardial borders. The left ventricular internal cavity size was normal in size. There is mild concentric left ventricular hypertrophy. Left ventricular diastolic parameters are indeterminate. Right Ventricle: The right ventricular size is normal. No increase in right ventricular wall thickness. Right ventricular systolic function is normal. There is normal pulmonary artery systolic pressure. The tricuspid regurgitant velocity is 2.33 m/s, and  with an assumed right atrial pressure of 8 mmHg, the estimated right ventricular systolic pressure is [id number] mmHg. Left Atrium: Left atrial size was not well visualized. Right Atrium: Right atrial size was not well visualized. Pericardium: Trivial pericardial effusion is present. The pericardial effusion is anterior to the right ventricle. Mitral Valve: The mitral valve was not well visualized. Trivial mitral valve regurgitation. No evidence of mitral valve stenosis. MV peak gradient, 2.0 mmHg. The mean mitral valve gradient is 1.0 mmHg. Tricuspid Valve: The tricuspid valve is normal in structure. Tricuspid valve regurgitation is trivial. No evidence of tricuspid stenosis. Aortic Valve: The aortic valve is tricuspid. There is mild calcification of the aortic valve. Aortic valve regurgitation is not visualized. Aortic valve sclerosis/calcification is present, without any evidence of aortic stenosis. Aortic valve mean gradient measures 5.0 mmHg. Aortic valve peak gradient measures 8.5 mmHg. Aortic valve area, by VTI measures 2.08 cm. Pulmonic Valve: The pulmonic valve was normal in structure. Pulmonic valve regurgitation is trivial. No evidence of pulmonic  stenosis. Aorta: The aortic root is normal in size and structure. Venous: The inferior vena cava is normal in size with greater than 50% respiratory variability, suggesting right atrial pressure of 3 mmHg. IAS/Shunts: No atrial level shunt detected by color flow Doppler.  LEFT VENTRICLE PLAX 2D LVIDd:         4.90 cm LVIDs:         3.30 cm LV PW:         1.30 cm LV IVS:        1.10 cm LVOT diam:     2.10 cm LV SV:         50 LV SV Index:   20 LVOT Area:  3.46 cm  LEFT ATRIUM         Index LA diam:    3.10 cm 1.21 cm/m  AORTIC VALVE                    PULMONIC VALVE AV Area (Vmax):    1.97 cm     PV Vmax:       0.88 m/s AV Area (Vmean):   1.72 cm     PV Peak grad:  3.1 mmHg AV Area (VTI):     2.08 cm AV Vmax:           146.00 cm/s AV Vmean:          99.100 cm/s AV VTI:            0.241 m AV Peak Grad:      8.5 mmHg AV Mean Grad:      5.0 mmHg LVOT Vmax:         82.90 cm/s LVOT Vmean:        49.200 cm/s LVOT VTI:          0.145 m LVOT/AV VTI ratio: 0.60  AORTA Ao Root diam: 3.30 cm MITRAL VALVE               TRICUSPID VALVE MV Area (PHT): 4.12 cm    TR Peak grad:   21.7 mmHg MV Area VTI:   3.12 cm    TR Vmax:        233.00 cm/s MV Peak grad:  2.0 mmHg MV Mean grad:  1.0 mmHg    SHUNTS MV Vmax:       0.70 m/s    Systemic VTI:  0.14 m MV Vmean:      45.6 cm/s   Systemic Diam: 2.10 cm MV Decel Time: 184 msec MV E velocity: 57.00 cm/s MV A velocity: 62.10 cm/s MV E/A ratio:  0.92 Glori Bickers MD Electronically signed by Glori Bickers MD Signature Date/Time: 10/24/2022/4:34:36 PM    Final    EEG adult  Result Date: 10/24/2022 Lora Havens, MD     10/24/2022 10:12 AM Patient Name: MADYN FULLEN MRN: [id number] Epilepsy Attending: Lora Havens Referring Physician/Provider: Vianne Bulls, MD Date: 10/24/2022 Duration: 25.25 mins Patient history: 52 year old female with syncope in the setting of pelvic infection. EEG to evaluate for seizure Level of alertness: lethargic AEDs during EEG study: PGB,  TPM Technical aspects: This EEG study was done with scalp electrodes positioned according to the 10-20 International system of electrode placement. Electrical activity was reviewed with band pass filter of 1-70Hz , sensitivity of 7 uV/mm, display speed of 51mm/sec with a 60Hz  notched filter applied as appropriate. EEG data were recorded continuously and digitally stored.  Video monitoring was available and reviewed as appropriate. Description: The posterior dominant rhythm consists of 8 Hz activity of moderate voltage (25-35 uV) seen predominantly in posterior head regions, symmetric and reactive to eye opening and eye closing. EEG showed intermittent generalized 3 to 6 Hz theta-delta slowing. Physiologic photic driving was not seen during photic stimulation.  Hyperventilation was not performed.   ABNORMALITY - Intermittent slow, generalized IMPRESSION: This study is suggestive of mild diffuse encephalopathy, nonspecific etiology. No seizures or epileptiform discharges were seen throughout the recording. Please note lack of epileptiform activity during interictal EEG does not exclude the diagnosis of epilepsy. Lora Havens   US Pelvis Complete  Result Date: 10/24/2022 CLINICAL DATA:  Infection and pain. EXAM: TRANSABDOMINAL ULTRASOUND OF PELVIS TECHNIQUE:  Transabdominal ultrasound examination of the pelvis was performed including evaluation of the uterus, ovaries, adnexal regions, and pelvic cul-de-sac. COMPARISON:  CT 10/23/2022 FINDINGS: Uterus Uterus is not visualized. Endometrium Not visualized. Right ovary Not visualized. Left ovary Not visualized. Other findings: Examination is technically limited due to patient's body habitus and incomplete filling of the bladder as well as bowel gas. IMPRESSION: Technically limited examination. Uterus, ovaries, and adnexal structures are not visualized. Electronically Signed   By: Lucienne Capers M.D.   On: 10/24/2022 01:02   CT ABDOMEN PELVIS W CONTRAST  Result  Date: 10/23/2022 CLINICAL DATA:  Abdominal pain. EXAM: CT ABDOMEN AND PELVIS WITH CONTRAST TECHNIQUE: Multidetector CT imaging of the abdomen and pelvis was performed using the standard protocol following bolus administration of intravenous contrast. RADIATION DOSE REDUCTION: This exam was performed according to the departmental dose-optimization program which includes automated exposure control, adjustment of the mA and/or kV according to patient size and/or use of iterative reconstruction technique. CONTRAST:  77mL OMNIPAQUE IOHEXOL 350 MG/ML SOLN COMPARISON:  CT abdomen pelvis dated 10/14/2022. FINDINGS: Lower chest: Minimal bibasilar atelectasis. The visualized lung bases are otherwise clear. No intra-abdominal free air.  Trace free fluid in the pelvis. Hepatobiliary: The liver is unremarkable. No biliary dilatation. The gallbladder is unremarkable Pancreas: Unremarkable. No pancreatic ductal dilatation or surrounding inflammatory changes. Spleen: Normal in size without focal abnormality. Adrenals/Urinary Tract: The adrenal glands are unremarkable there is no hydronephrosis on either side. There is symmetric excretion of contrast by both kidneys. The visualized ureters appear unremarkable. The urinary bladder is collapsed. Stomach/Bowel: There is a small hiatal hernia. There is no bowel obstruction or active inflammation. The appendix is normal. Vascular/Lymphatic: The abdominal aorta and IVC are unremarkable. No portal venous gas. There is no adenopathy. Reproductive: The uterus appears anteverted with poorly visualized. There is diffuse inflammatory changes and stranding of the pelvic fat, new since the prior CT. There is a partially organized low attenuating collection in the left hemipelvis measuring approximately 5 x 5 cm in greatest axial dimensions concerning for developing abscess. Small amount of high density fluid may represent complex fluid or blood product. Findings may represent pelvic inflammatory  disease with developing abscesses. Clinical correlation and further evaluation with pelvic ultrasound recommended. Other: None Musculoskeletal: No acute or significant osseous findings. IMPRESSION: 1. Diffuse inflammatory changes and stranding of the pelvic fat, new since the prior CT. Findings may represent an inflammatory/infectious process such as PID with developing abscess. A small amount of hemorrhagic content may also be present in the pelvis and therefore the possibility of a ruptured hemorrhagic cyst, or trauma is not excluded. No drainable fluid collection/abscess at this time. Clinical correlation and further evaluation with pelvic ultrasound recommended. 2. No bowel obstruction. Normal appendix. Electronically Signed   By: Anner Crete M.D.   On: 10/23/2022 22:08   CT ANGIO HEAD NECK W WO CM (CODE STROKE)  Result Date: 10/23/2022 CLINICAL DATA:  Initial evaluation for stroke. EXAM: CT ANGIOGRAPHY HEAD AND NECK TECHNIQUE: Multidetector CT imaging of the head and neck was performed using the standard protocol during bolus administration of intravenous contrast. Multiplanar CT image reconstructions and MIPs were obtained to evaluate the vascular anatomy. Carotid stenosis measurements (when applicable) are obtained utilizing NASCET criteria, using the distal internal carotid diameter as the denominator. RADIATION DOSE REDUCTION: This exam was performed according to the departmental dose-optimization program which includes automated exposure control, adjustment of the mA and/or kV according to patient size and/or use of iterative reconstruction technique.  CONTRAST:  80mL OMNIPAQUE IOHEXOL 350 MG/ML SOLN COMPARISON:  CT from earlier the same day as well as prior exam from 02/18/2021. FINDINGS: CTA NECK FINDINGS Aortic arch: Visualized aortic arch normal caliber with standard branch pattern. No stenosis about the origin the great vessels. Right carotid system: Right common and internal carotid arteries  widely patent without stenosis, dissection or occlusion. Left carotid system: Left common and internal carotid arteries widely patent without stenosis, dissection or occlusion. Vertebral arteries: Both vertebral arteries arise from subclavian arteries. No proximal subclavian artery stenosis. Vertebral arteries are markedly hypoplastic but patent without stenosis or dissection. Skeleton: No discrete or worrisome osseous lesions. Moderate spondylosis present at C4-5 through C6-7. Other neck: No other acute soft tissue abnormality within the neck. Few punctate sialoliths noted within the left parotid gland. Upper chest: Visualized upper chest demonstrates no acute finding. Review of the MIP images confirms the above findings CTA HEAD FINDINGS Anterior circulation: Both internal carotid arteries are patent to the termini without stenosis. Persistent trigeminal artery noted on the right. A1 segments, anterior communicating complex common anterior cerebral arteries patent without stenosis. No M1 stenosis or occlusion. No proximal MCA branch occlusion or high-grade stenosis. Distal MCA branches perfused and symmetric. Posterior circulation: Both V4 segments patent without stenosis. Both PICA patent. Proximal basilar artery is diminutive but patent. Basilar more normal in caliber distal to the persistent right trigeminal artery. Superior cerebellar and posterior cerebral arteries patent bilaterally. Venous sinuses: Patent allowing for timing the contrast bolus. Anatomic variants: Persistent trigeminal artery with diminutive vertebrobasilar system. Review of the MIP images confirms the above findings IMPRESSION: 1. Negative CTA of the head and neck. No large vessel occlusion or other emergent finding. 2. 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 These results were communicated to Dr. Leonel Ramsay at 9:59 pm on 10/23/2022 by text page via the [facility] messaging system.  Electronically Signed   By: Jeannine Boga M.D.   On: 10/23/2022 22:02   CT HEAD CODE STROKE WO CONTRAST  Result Date: 10/23/2022 CLINICAL DATA:  Code stroke. EXAM: CT HEAD WITHOUT CONTRAST TECHNIQUE: Contiguous axial images were obtained from the base of the skull through the vertex without intravenous contrast. RADIATION DOSE REDUCTION: This exam was performed according to the departmental dose-optimization program which includes automated exposure control, adjustment of the mA and/or kV according to patient size and/or use of iterative reconstruction technique. COMPARISON:  None Available. FINDINGS: Brain: Cerebral volume within normal limits. No acute intracranial hemorrhage. No acute large vessel territory infarct. No mass lesion, midline shift or mass effect. No hydrocephalus or extra-axial fluid collection. Vascular: No abnormal hyperdense vessel. Skull: Scalp soft tissues and calvarium within normal limits. Sinuses/Orbits: Left a spur [place] noted. Few small retention cyst noted within the left maxillary sinus. Mild mucosal thickening noted about the ethmoidal air cells. No mastoid effusion. Other: None. ASPECTS [facility] Stroke Program Early CT Score) - Ganglionic level infarction (caudate, lentiform nuclei, internal capsule, insula, M1-M3 cortex): 7 - Supraganglionic infarction (M4-M6 cortex): 3 Total score (0-10 with 10 being normal): 10 IMPRESSION: 1. No acute intracranial abnormality. 2. ASPECTS is 10. These results were communicated to Dr. Leonel Ramsay at 9:17 pm on 10/23/2022 by text page via the [facility] messaging system. Electronically Signed   By: Jeannine Boga M.D.   On: 10/23/2022 21:18    Labs:  CBC: Recent Labs    10/24/22 0643 10/25/22 0722 10/26/22 0406 10/27/22 0724  WBC 31.1* 34.6* 24.0* 22.8*  HGB 10.7* 9.8* 9.9* 9.5*  HCT 32.2* 29.5* 29.0* 28.1*  PLT 322 285 277 329     COAGS: Recent Labs    10/23/22 2110  INR 1.2  APTT 26     BMP: Recent Labs     10/24/22 0643 10/25/22 0722 10/26/22 0406 10/27/22 0724  NA 134* 136 135 137  K 3.7 3.9 3.8 3.5  CL 106 103 105 107  CO2 17* 17* 19* 19*  GLUCOSE 196* 111* 150* 87  BUN 9 18 25* 22*  CALCIUM 8.6* 8.4* 8.2* 8.4*  CREATININE 0.97 2.29* 1.92* 1.29*  GFRNONAA >60 25* 31* 50*     LIVER FUNCTION TESTS: Recent Labs    10/21/22 1648 10/23/22 2110 10/26/22 0406 10/27/22 0724  BILITOT 0.7 0.9 1.3* 1.2  AST 16 13* 17 15  ALT 11 10 14 13   ALKPHOS 112 111 105 124  PROT 6.9 6.9 5.8* 6.0*  ALBUMIN 2.9* 2.9* 1.9* 2.0*     Assessment and Plan: Intra-abdominal fluid collection s/p LLQ drain placement 3/26 by Dr. Vernard Gambles.   Drain Location: LLQ Size: Fr size: 12 Fr Date of placement: 3/26  Currently to: Drain collection device: gravity 24 hour output:  Output by Drain (mL) 10/25/22 0701 - 10/25/22 1900 10/25/22 1901 - 10/26/22 0700 10/26/22 0701 - 10/26/22 1900 10/26/22 1901 - 10/27/22 0700 10/27/22 0701 - 10/27/22 1442  Closed System Drain 1 Left LLQ Other (Comment) 12 Fr. 60 100 0 25     Plan: Continue TID flushes with 5 cc NS. Record output Q shift. Dressing changes QD or PRN if soiled.  Call IR APP or on call IR MD if difficulty flushing or sudden change in drain output.  Repeat imaging/possible drain injection once output < 10 mL/QD (excluding flush material). Consideration for drain removal if output is < 10 mL/QD (excluding flush material), pending discussion with the providing surgical service.  Discharge planning: Please contact IR APP or on call IR MD prior to patient d/c to ensure appropriate follow up plans are in place.   IR will continue to follow - please call with questions or concerns.  Electronically Signed: Ascencion Dike, PA-C 10/27/2022, 2:42 PM   I spent a total of 15 Minutes at the the patient's bedside AND on the patient's hospital floor or unit, greater than 50% of which was counseling/coordinating care for intra-abdominal abscess.

## 2022-10-27 NOTE — Progress Notes (Signed)
SLP Cancellation Note  Patient Details Name: TAHNIA BETKER MRN: [id number] DOB: 16-Jun-1971   Cancelled treatment:       Reason Eval/Treat Not Completed: Patient declined, no reason specified. Patient refused all offers of PO's, stating she was nauseated and in pain. SLP will continue to follow as able.   Sonia Baller, MA, CCC-SLP Speech Therapy

## 2022-10-27 NOTE — TOC Progression Note (Signed)
Transition of Care [facility]) - Progression Note    Patient Details  Name: [name] MRN: [id number] Date of Birth: [birth year]  Transition of Care [facility]) CM/SW Contact  Pollie Friar, RN Phone Number: 10/27/2022, 10:00 AM  Clinical Narrative:    Pt underwent drain placement with IR. She continues to be lethargic.  TOC following.   Expected Discharge Plan:  (may need SNF) Barriers to Discharge: Continued Medical Work up  Expected Discharge Plan and Services       Living arrangements for the past 2 months: [place] Determinants of Health (SDOH) Interventions SDOH Screenings   Food Insecurity: No Food Insecurity (10/27/2022)  Housing: Low Risk  (10/27/2022)  Transportation Needs: No Transportation Needs (10/27/2022)  Utilities: Not At Risk (10/27/2022)  Tobacco Use: Medium Risk (10/24/2022)    Readmission Risk Interventions    05/26/2021    4:02 PM 02/05/2020   10:31 AM  Readmission Risk Prevention Plan  Transportation Screening Complete Complete  Medication Review ([place])  Referral to Pharmacy  PCP or Specialist appointment within 3-5 days of discharge Complete   HRI or [place] Complete Complete  SW Recovery Care/Counseling Consult Complete Complete  [place] Not Applicable Not Applicable

## 2022-10-27 NOTE — Plan of Care (Signed)
  Problem: Education: Goal: Ability to describe self-care measures that may prevent or decrease complications (Diabetes Survival Skills Education) will improve Outcome: Progressing Goal: Individualized Educational Video(s) Outcome: Progressing   Problem: Coping: Goal: Ability to adjust to condition or change in health will improve Outcome: Progressing   Problem: Fluid Volume: Goal: Ability to maintain a balanced intake and output will improve Outcome: Progressing   Problem: Health Behavior/Discharge Planning: Goal: Ability to identify and utilize available resources and services will improve Outcome: Progressing Goal: Ability to manage health-related needs will improve Outcome: Progressing   Problem: Metabolic: Goal: Ability to maintain appropriate glucose levels will improve Outcome: Progressing   Problem: Nutritional: Goal: Progress toward achieving an optimal weight will improve Outcome: Progressing   Problem: Skin Integrity: Goal: Risk for impaired skin integrity will decrease Outcome: Progressing   Problem: Tissue Perfusion: Goal: Adequacy of tissue perfusion will improve Outcome: Progressing   Problem: Education: Goal: Knowledge of General Education information will improve Description: Including pain rating scale, medication(s)/side effects and non-pharmacologic comfort measures Outcome: Progressing   Problem: Health Behavior/Discharge Planning: Goal: Ability to manage health-related needs will improve Outcome: Progressing   Problem: Clinical Measurements: Goal: Ability to maintain clinical measurements within normal limits will improve Outcome: Progressing Goal: Will remain free from infection Outcome: Progressing Goal: Diagnostic test results will improve Outcome: Progressing Goal: Respiratory complications will improve Outcome: Progressing Goal: Cardiovascular complication will be avoided Outcome: Progressing   Problem: Activity: Goal: Risk for  activity intolerance will decrease Outcome: Progressing   Problem: Coping: Goal: Level of anxiety will decrease Outcome: Progressing   Problem: Elimination: Goal: Will not experience complications related to bowel motility Outcome: Progressing Goal: Will not experience complications related to urinary retention Outcome: Progressing   Problem: Pain Managment: Goal: General experience of comfort will improve Outcome: Progressing   Problem: Safety: Goal: Ability to remain free from injury will improve Outcome: Progressing   Problem: Skin Integrity: Goal: Risk for impaired skin integrity will decrease Outcome: Progressing   Problem: Nutritional: Goal: Maintenance of adequate nutrition will improve Outcome: Not Progressing Still with poor po intake and intermittent nausea

## 2022-10-27 NOTE — Progress Notes (Signed)
Patient refusing mobility due to her abdominal pain.

## 2022-10-27 NOTE — Plan of Care (Signed)

## 2022-10-27 NOTE — Progress Notes (Signed)
[facility] Progress Note Patient: [name] [id number] DOB: 07-26-1971 DOA: 10/23/2022  DOS: the patient was seen and examined on 10/27/2022  Brief hospital course: 52 year old African-American female with past medical history of morbid obesity with BMI greater than 50, systolic/diastolic CHF with previous ejection fraction of 30 to 35% and grade 1 diastolic dysfunction, chronic right ventricular failure, history of DVT, hypothyroidism, depression and anxiety and chronic abdominal pain who presented to the emergency room on 3/24 with complaints of lightheadedness and abdominal pain.  She was also found to be somewhat confused.  Workup revealed severe sepsis secondary to intra-abdominal abscess concerning for PID/TOA and case discussed with gynecology.  Mass not present on CT on 3/15.  Interventional radiology consulted and patient underwent drain placement 3/26.  Mentation has been slowly improving.  Assessment and Plan: Syncope. Acute metabolic encephalopathy/hypersomnolence. Initially presented with what appears to be a syncopal event. There was a concern for seizures, however EEG was unremarkable.  Ammonia level, TSH, HIV, RPR all unremarkable.  VBG negative for hypercarbia.  Neurology evaluated and felt findings unrelated to any neurological issue.  Initial concern this could be secondary to pain medication.  Patient's IV pain medicine was held and she became more appropriate and alert.  Felt to be multifactorial secondary to decreased clearance from acute kidney injury, additional anesthesia and pain medication intervention radiology for drain placement and IV pain medicine.  Have also decreased patient's likely cervical.  Patient much improved today despite getting continued pain medication is alert and oriented x 3  Severe sepsis secondary to intra-abdominal abscess in patient with acute on chronic abdominal pain. Patient met criteria for severe sepsis given fever, leukocytosis, AKI  creatinine greater than 2 and abdominal source.  CT abdomen shows evidence of a 5 x 5 cm fluid collection in the left hemipelvis concerning for developing abscess.  GYN was consulted.  Discussed with Dr. Roselie Awkward. She has not had sex in 4 years and the mass was not present on CT 3/15.  GYN does not think that the patient is suffering from a tubo-ovarian abscess.  Patient underwent IR guided drain placement on 3/26.  Following drain placement and antibiotic adjustment, white blood cell count has continued to improve.  Follow-up on procalcitonin level in the morning.  AKI. Suspect ATN in the setting of sepsis, possibility of contrast-induced injury as well as prerenal etiology in the setting of poor p.o. intake.  Improving with IV fluids. Initiate with IV fluid.  Monitor closely due to history of CHF.  Much improved by 3/28  Hypersomnolence. OHS. Obesity class III. Body mass index is 52.46 kg/m.  Placing the patient at high risk of poor outcome. Patient was recommended to have an outpatient sleep evaluation during her recent hospitalization.  Chronic combined systolic and diastolic CHF. Echocardiogram done during this hospitalization notes much improvement in ejection fraction and indeterminate diastolic function.  BNP within normal limits  Gastroparesis. Patient is not on any medication for gastroparesis.  Tolerating p.o. QTc is prolonged. Currently on Compazine. Will resume home scopolamine patch.  History of PE. Had a PE last October, told that she needs to be on Eliquis indefinitely.  Type 2 diabetes mellitus, uncontrolled with hyperglycemia with gastroparesis. With long-term insulin use. Patient is actually on U-500 insulin at home. Due to n.p.o. status currently on once a day regimen of long-acting insulin. Continue sliding scale insulin.   Lupus and Sjogren's syndrome. Patient is on hydroxychloroquine, belimumab as well as Imuran. Patient remains at risk for infections.  Monitor  for now.  Anxiety and depression. Continue home regimen.  Hypothyroidism. Continue Synthroid.  Overactive bladder. On Ditropan, currently held  GERD. Continue PPI.  Diabetic neuropathy. On Lyrica. Due to encephalopathy we will hold it for now.  Restart slowly  Hyperlipidemia. Continue Crestor.  Oral candidiasis. Will treat with Diflucan.  Subjective: Patient having some abdominal pain although improved  Physical Exam: General: Oriented x 3, no acute distress Cardiovascular: Regular rate and rhythm, S1-S2 Lungs: Decreased breath sounds throughout secondary to body habitus Abdomen: Soft, obese, mild generalized nonspecific tenderness, hypoactive bowel sounds Extremities: No clubbing or cyanosis, trace pitting edema  Data Reviewed: White blood cell count down to 22, creatinine down to 1.29  Disposition: Status is: Inpatient Remains inpatient appropriate because: -IV antibiotics and improvement in infection -Improvement in renal function  apixaban (ELIQUIS) tablet 2.5 mg Start: 10/25/22 2200 Place and maintain sequential compression device Start: 10/25/22 1103 apixaban (ELIQUIS) tablet 2.5 mg   Family Communication: Updated sister by phone Level of care: Telemetry Medical   Vitals:   10/27/22 0500 10/27/22 0805 10/27/22 1025 10/27/22 1143  BP:  125/67 122/70 119/83  Pulse:  94 94 94  Resp:  20  (!) 21  Temp:  99 F (37.2 C)  99 F (37.2 C)  TempSrc:  Oral  Oral  SpO2:  99% 97% 98%  Weight: (!) 156.5 kg     Height:         Author: Annita Brod, MD 10/27/2022 2:22 PM  Please look on [email or web address] to find out who is on call.

## 2022-10-28 ENCOUNTER — Inpatient Hospital Stay (HOSPITAL_COMMUNITY): Payer: Medicaid Other

## 2022-10-28 DIAGNOSIS — G9340 Encephalopathy, unspecified: Secondary | ICD-10-CM | POA: Diagnosis not present

## 2022-10-28 DIAGNOSIS — K651 Peritoneal abscess: Secondary | ICD-10-CM | POA: Diagnosis not present

## 2022-10-28 DIAGNOSIS — I5022 Chronic systolic (congestive) heart failure: Secondary | ICD-10-CM | POA: Diagnosis not present

## 2022-10-28 DIAGNOSIS — A419 Sepsis, unspecified organism: Secondary | ICD-10-CM | POA: Diagnosis not present

## 2022-10-28 LAB — CBC
HCT: 25.6 % — ABNORMAL LOW (ref 36.0–46.0)
Hemoglobin: 9 g/dL — ABNORMAL LOW (ref 12.0–15.0)
MCH: 28.8 pg (ref 26.0–34.0)
MCHC: 35.2 g/dL (ref 30.0–36.0)
MCV: 81.8 fL (ref 80.0–100.0)
Platelets: 321 10*3/uL (ref 150–400)
RBC: 3.13 MIL/uL — ABNORMAL LOW (ref 3.87–5.11)
RDW: 13.2 % (ref 11.5–15.5)
WBC: 13.1 10*3/uL — ABNORMAL HIGH (ref 4.0–10.5)
nRBC: 0 % (ref 0.0–0.2)

## 2022-10-28 LAB — COMPREHENSIVE METABOLIC PANEL
ALT: 12 U/L (ref 0–44)
AST: 16 U/L (ref 15–41)
Albumin: 1.7 g/dL — ABNORMAL LOW (ref 3.5–5.0)
Alkaline Phosphatase: 112 U/L (ref 38–126)
Anion gap: 9 (ref 5–15)
BUN: 16 mg/dL (ref 6–20)
CO2: 18 mmol/L — ABNORMAL LOW (ref 22–32)
Calcium: 8.4 mg/dL — ABNORMAL LOW (ref 8.9–10.3)
Chloride: 109 mmol/L (ref 98–111)
Creatinine, Ser: 0.93 mg/dL (ref 0.44–1.00)
GFR, Estimated: >60 mL/min (ref >60)
Glucose, Bld: 102 mg/dL — ABNORMAL HIGH (ref 70–99)
Potassium: 3.4 mmol/L — ABNORMAL LOW (ref 3.5–5.1)
Sodium: 136 mmol/L (ref 135–145)
Total Bilirubin: 1.2 mg/dL (ref 0.3–1.2)
Total Protein: 5.4 g/dL — ABNORMAL LOW (ref 6.5–8.1)

## 2022-10-28 LAB — GLUCOSE, CAPILLARY
Glucose-Capillary: 71 mg/dL (ref 70–99)
Glucose-Capillary: 62 mg/dL — ABNORMAL LOW (ref 70–99)
Glucose-Capillary: 92 mg/dL (ref 70–99)
Glucose-Capillary: 76 mg/dL (ref 70–99)
Glucose-Capillary: 65 mg/dL — ABNORMAL LOW (ref 70–99)
Glucose-Capillary: 71 mg/dL (ref 70–99)
Glucose-Capillary: 84 mg/dL (ref 70–99)
Glucose-Capillary: 87 mg/dL (ref 70–99)

## 2022-10-28 LAB — PROCALCITONIN: Procalcitonin: 0.82 ng/mL

## 2022-10-28 MED ORDER — METOCLOPRAMIDE HCL 5 MG/ML IJ SOLN: 10.0000 mg | Freq: Four times a day (QID) | INTRAMUSCULAR | Status: DC | PRN

## 2022-10-28 MED ORDER — DEXTROSE-NACL 5-0.9 % IV SOLN
INTRAVENOUS | Status: AC
  Administered 2022-11-01: 75 mL/h via INTRAVENOUS

## 2022-10-28 MED ORDER — GLUCAGON HCL RDNA (DIAGNOSTIC) 1 MG IJ SOLR
1.0000 mg | Freq: Once | INTRAMUSCULAR | Status: AC | PRN
  Administered 2022-10-28: 1 mg via INTRAVENOUS

## 2022-10-28 MED ORDER — METOCLOPRAMIDE HCL 5 MG/ML IJ SOLN
5.0000 mg | Freq: Four times a day (QID) | INTRAMUSCULAR | Status: DC
  Filled 2022-10-28: qty 2

## 2022-10-28 MED ORDER — GLUCAGON HCL RDNA (DIAGNOSTIC) 1 MG IJ SOLR
INTRAMUSCULAR | Status: AC
  Filled 2022-10-28: qty 1

## 2022-10-28 NOTE — Progress Notes (Signed)
Blood glucose in 62. Dr. Mitchell Heir was notified. Asked for Glucagon IV. Gave the patient apple juice. Will continue to monitor.

## 2022-10-28 NOTE — Progress Notes (Signed)
Abdominal dressing changed.  

## 2022-10-28 NOTE — Progress Notes (Signed)
Patient having poor appetite. Encouraged patient to drink apple juice to keep blood glucose levels WNL. Patient agreed to drink a little bit of juice. Will continue to monitor.

## 2022-10-28 NOTE — Progress Notes (Signed)
[facility] Progress Note Patient: [name] [id number] DOB: 27-Sep-1970 DOA: 10/23/2022  DOS: the patient was seen and examined on 10/28/2022  Brief hospital course: 52 year old African-American female with past medical history of morbid obesity with BMI greater than 50, systolic/diastolic CHF with previous ejection fraction of 30 to 35% and grade 1 diastolic dysfunction, chronic right ventricular failure, history of DVT, hypothyroidism, depression and anxiety and chronic abdominal pain who presented to the emergency room on 3/24 with complaints of lightheadedness and abdominal pain.  She was also found to be somewhat confused.  Workup revealed severe sepsis secondary to intra-abdominal abscess concerning for PID/TOA and case discussed with gynecology.  Mass not present on CT on 3/15.  Interventional radiology consulted and patient underwent drain placement 3/26.  Mentation much improved  Assessment and Plan: Syncope. Acute metabolic encephalopathy/hypersomnolence-resolved Initially presented with what appears to be a syncopal event. There was a concern for seizures, however EEG was unremarkable.  Ammonia level, TSH, HIV, RPR all unremarkable.  VBG negative for hypercarbia.  Neurology evaluated and felt findings unrelated to any neurological issue.  Initial concern this could be secondary to pain medication.  Patient's IV pain medicine was held and she became more appropriate and alert.  Felt to be multifactorial secondary to decreased clearance from acute kidney injury, additional anesthesia and pain medication intervention radiology for drain placement and IV pain medicine.  Have also decreased patient's likely cervical.  Patient much improved today despite getting continued pain medication is alert and oriented x 3  Severe sepsis secondary to intra-abdominal abscess in patient with acute on chronic abdominal pain. Patient met criteria for severe sepsis given fever, leukocytosis, AKI  creatinine greater than 2 and abdominal source.  CT abdomen shows evidence of a 5 x 5 cm fluid collection in the left hemipelvis concerning for developing abscess.  GYN was consulted.  Discussed with Dr. Roselie Awkward. She has not had sex in 4 years and the mass was not present on CT 3/15.  GYN does not think that the patient is suffering from a tubo-ovarian abscess.  Patient underwent IR guided drain placement on 3/26.  Following drain placement and antibiotic adjustment, white blood cell count has continued to improve.  White blood cell count and procalcitonin continue to improve.  AKI-resolved. Suspect ATN in the setting of sepsis, possibility of contrast-induced injury as well as prerenal etiology in the setting of poor p.o. intake.  Improving with IV fluids. Initiate with IV fluid.  Monitor closely due to history of CHF.  Much improved by 3/28  Hypersomnolence. OHS. Obesity class III. Body mass index is 52.46 kg/m.  Placing the patient at high risk of poor outcome. Patient was recommended to have an outpatient sleep evaluation during her recent hospitalization.  Chronic combined systolic and diastolic CHF. Echocardiogram done during this hospitalization notes much improvement in ejection fraction and indeterminate diastolic function.  BNP within normal limits  Gastroparesis. Patient is not on any medication for gastroparesis.  Tolerating p.o. Still reporting some nausea and vomiting.  Intolerant of Reglan.  Abdominal x-ray notes multiple dilated air-filled loops.  Will replace electrolytes accordingly. Currently on Zofran. Will resume home scopolamine patch.  History of PE. Had a PE last October, told that she needs to be on Eliquis indefinitely.  Type 2 diabetes mellitus, uncontrolled with hyperglycemia with gastroparesis. With long-term insulin use. Patient is actually on U-500 insulin at home. Due to full liquids only and minimum intake, have added dextrose to IV fluids, currently on  once  a day regimen of long-acting insulin. Continue sliding scale insulin.   Lupus and Sjogren's syndrome. Patient is on hydroxychloroquine, belimumab as well as Imuran. Patient remains at risk for infections. Monitor for now.  Anxiety and depression. Continue home regimen.  Hypothyroidism. Continue Synthroid.  Overactive bladder. On Ditropan, currently held  GERD. Continue PPI.  Diabetic neuropathy. On Lyrica. Due to encephalopathy we will hold it for now.  Restart slowly  Hyperlipidemia. Continue Crestor.  Oral candidiasis. Will treat with Diflucan.  Subjective: Complains of some nausea and vomiting  Physical Exam: General: Oriented x 3, no acute distress Cardiovascular: Regular rate and rhythm, S1-S2 Lungs: Decreased breath sounds throughout secondary to body habitus Abdomen: Soft, obese, mild generalized nonspecific tenderness, hypoactive bowel sounds Extremities: No clubbing or cyanosis, trace pitting edema  Data Reviewed: White count down to 13, renal function normalized, procalcitonin level improving  Disposition: Status is: Inpatient Remains inpatient appropriate because: -IV antibiotics and improvement in infection -taking in PO  apixaban (ELIQUIS) tablet 2.5 mg Start: 10/25/22 2200 Place and maintain sequential compression device Start: 10/25/22 1103 apixaban (ELIQUIS) tablet 2.5 mg   Family Communication: Updated sister by phone Level of care: Telemetry Medical   Vitals:   10/28/22 0448 10/28/22 0812 10/28/22 0900 10/28/22 1213  BP: 132/73 123/75    Pulse: 97 94 95   Resp: 16 14    Temp: 98.7 F (37.1 C) 98.3 F (36.8 C)  98.4 F (36.9 C)  TempSrc: Oral Oral  Oral  SpO2: 98% 98%    Weight:      Height:         Author: Annita Brod, MD 10/28/2022 3:08 PM  Please look on [email or web address] to find out who is on call.

## 2022-10-28 NOTE — Progress Notes (Signed)
Hypoglycemic event with BG of 65. 4 oz of apple juice given per hypoglycemia protocol. Recheck 71 and patient refusing further p.o. intake. Dr Maryland Pink notified and fluids changed per order

## 2022-10-28 NOTE — Progress Notes (Signed)
Per Dr. Mitchell Heir, "hold any long acting insulin for 24 hours."

## 2022-10-28 NOTE — TOC Progression Note (Signed)
Transition of Care [facility]) - Progression Note    Patient Details  Name: SATIN SELAN MRN: [id number] Date of Birth: [birth year]  Transition of Care [facility]) CM/SW Contact  Pollie Friar, RN Phone Number: 10/28/2022, 1:35 PM  Clinical Narrative:    Pt is s/p abd drain placement. Pt still with lots a pain. On IV abx.  May need therapies to eval prior to dc home as she lives alone. TOC following.   Expected Discharge Plan:  (may need SNF) Barriers to Discharge: Continued Medical Work up  Expected Discharge Plan and Services       Living arrangements for the past 2 months: [place] Determinants of Health (SDOH) Interventions SDOH Screenings   Food Insecurity: No Food Insecurity (10/27/2022)  Housing: Low Risk  (10/27/2022)  Transportation Needs: No Transportation Needs (10/27/2022)  Utilities: Not At Risk (10/27/2022)  Tobacco Use: Medium Risk (10/24/2022)    Readmission Risk Interventions    05/26/2021    4:02 PM 02/05/2020   10:31 AM  Readmission Risk Prevention Plan  Transportation Screening Complete Complete  Medication Review ([place])  Referral to Pharmacy  PCP or Specialist appointment within 3-5 days of discharge Complete   HRI or [place] Complete Complete  SW Recovery Care/Counseling Consult Complete Complete  [place] Not Applicable Not Applicable

## 2022-10-28 NOTE — Progress Notes (Signed)
Blood glucose in 92 after Glucagon IV was administered.

## 2022-10-28 NOTE — Progress Notes (Signed)
Glucagon IV ordered and administered to the patient. Will continue to monitor.

## 2022-10-28 NOTE — Inpatient Diabetes Management (Signed)
Inpatient Diabetes Program Recommendations  AACE/ADA: New Consensus Statement on Inpatient Glycemic Control (2015)  Target Ranges:  Prepandial:   less than 140 mg/dL      Peak postprandial:   less than 180 mg/dL (1-2 hours)      Critically ill patients:  140 - 180 mg/dL   Lab Results  Component Value Date   GLUCAP 76 10/28/2022   HGBA1C 7.4 (H) 05/23/2022    Review of Glycemic Control  Latest Reference Range & Units 10/27/22 10:42 10/27/22 11:44 10/27/22 15:52 10/27/22 20:16 10/27/22 23:29 10/28/22 05:07 10/28/22 06:24 10/28/22 06:56 10/28/22 08:30  Glucose-Capillary 70 - 99 mg/dL 102 (H) 94 100 (H) 86 84 71 62 (L) 92 76   Diabetes history: Type 2 DM Outpatient Diabetes medications: U-500 100 units TID, Metformin 1000 mg BID, Ozempic 2 mg qwk, Jardiance 10 mg QD Current orders for Inpatient glycemic control:  Novolog 0-9 units Q4H Semglee 100 units BID   Note: Glucose trends lower than inpatient goal. Pt with hypoglycemia  Inpatient Diabetes Program Recommendations:     -   Consider reducing Semglee to 90 units  Thanks, Tama Headings RN, MSN, BC-ADM Inpatient Diabetes Coordinator Team Pager [phone] (8a-5p)

## 2022-10-28 NOTE — Progress Notes (Signed)
Report of new abdominal pain different location from known left quadrant abdominal pain and with ongoing nausea. Bowel sounds present but hypoactive and of note patient has had no BM since admission . Patient requesting MD notification/ evaluation. Dr. Maryland Pink notified and aware

## 2022-10-28 NOTE — Plan of Care (Signed)

## 2022-10-28 NOTE — Progress Notes (Signed)
Morning lantus held per provider due to hypoglemia

## 2022-10-29 ENCOUNTER — Inpatient Hospital Stay (HOSPITAL_COMMUNITY): Payer: Medicaid Other

## 2022-10-29 DIAGNOSIS — I5022 Chronic systolic (congestive) heart failure: Secondary | ICD-10-CM | POA: Diagnosis not present

## 2022-10-29 DIAGNOSIS — G9340 Encephalopathy, unspecified: Secondary | ICD-10-CM | POA: Diagnosis not present

## 2022-10-29 DIAGNOSIS — K56609 Unspecified intestinal obstruction, unspecified as to partial versus complete obstruction: Secondary | ICD-10-CM

## 2022-10-29 DIAGNOSIS — D72829 Elevated white blood cell count, unspecified: Secondary | ICD-10-CM | POA: Diagnosis not present

## 2022-10-29 LAB — GLUCOSE, CAPILLARY
Glucose-Capillary: 107 mg/dL — ABNORMAL HIGH (ref 70–99)
Glucose-Capillary: 114 mg/dL — ABNORMAL HIGH (ref 70–99)
Glucose-Capillary: 115 mg/dL — ABNORMAL HIGH (ref 70–99)
Glucose-Capillary: 125 mg/dL — ABNORMAL HIGH (ref 70–99)
Glucose-Capillary: 128 mg/dL — ABNORMAL HIGH (ref 70–99)
Glucose-Capillary: 94 mg/dL (ref 70–99)
Glucose-Capillary: 95 mg/dL (ref 70–99)

## 2022-10-29 LAB — AEROBIC/ANAEROBIC CULTURE W GRAM STAIN (SURGICAL/DEEP WOUND)

## 2022-10-29 LAB — CBC
HCT: 27.6 % — ABNORMAL LOW (ref 36.0–46.0)
Hemoglobin: 9.7 g/dL — ABNORMAL LOW (ref 12.0–15.0)
MCH: 28.9 pg (ref 26.0–34.0)
MCHC: 35.1 g/dL (ref 30.0–36.0)
MCV: 82.1 fL (ref 80.0–100.0)
Platelets: 375 10*3/uL (ref 150–400)
RBC: 3.36 MIL/uL — ABNORMAL LOW (ref 3.87–5.11)
RDW: 13.4 % (ref 11.5–15.5)
WBC: 22.2 10*3/uL — ABNORMAL HIGH (ref 4.0–10.5)
nRBC: 0 % (ref 0.0–0.2)

## 2022-10-29 LAB — PROCALCITONIN: Procalcitonin: 1.21 ng/mL

## 2022-10-29 LAB — BASIC METABOLIC PANEL
Anion gap: 10 (ref 5–15)
BUN: 12 mg/dL (ref 6–20)
CO2: 19 mmol/L — ABNORMAL LOW (ref 22–32)
Calcium: 8.5 mg/dL — ABNORMAL LOW (ref 8.9–10.3)
Chloride: 108 mmol/L (ref 98–111)
Creatinine, Ser: 0.83 mg/dL (ref 0.44–1.00)
GFR, Estimated: >60 mL/min (ref >60)
Glucose, Bld: 107 mg/dL — ABNORMAL HIGH (ref 70–99)
Potassium: 3.7 mmol/L (ref 3.5–5.1)
Sodium: 137 mmol/L (ref 135–145)

## 2022-10-29 NOTE — Progress Notes (Signed)
[facility] Progress Note Patient: [name] [id number] DOB: Dec 29, 1970 DOA: 10/23/2022  DOS: the patient was seen and examined on 10/29/2022  Brief hospital course: 52 year old African-American female with past medical history of morbid obesity with BMI greater than 50, systolic/diastolic CHF with previous ejection fraction of 30 to 35% and grade 1 diastolic dysfunction, chronic right ventricular failure, history of DVT, hypothyroidism, depression and anxiety and chronic abdominal pain who presented to the emergency room on 3/24 with complaints of lightheadedness and abdominal pain.  She was also found to be somewhat confused.  Workup revealed severe sepsis secondary to intra-abdominal abscess concerning for PID/TOA and case discussed with gynecology.  Mass not present on CT on 3/15.  Interventional radiology consulted and patient underwent drain placement 3/26.  Mentation much improved.  Patient had been on a limited diet full liquids due to complaints of abdominal pain.  Started having some nausea and vomiting 3/29.  Abdominal x-ray noted dilated small bowel loops concerning for developing ileus.  Symptoms have persisted and repeat x-ray on 3/30 confirms SBO versus ileus.  Assessment and Plan: Syncope. Acute metabolic encephalopathy/hypersomnolence-resolved Initially presented with what appears to be a syncopal event. There was a concern for seizures, however EEG was unremarkable.  Ammonia level, TSH, HIV, RPR all unremarkable.  VBG negative for hypercarbia.  Neurology evaluated and felt findings unrelated to any neurological issue.  Initial concern this could be secondary to pain medication.  Patient's IV pain medicine was held and she became more appropriate and alert.  Felt to be multifactorial secondary to decreased clearance from acute kidney injury, additional anesthesia and pain medication from intervention radiology for drain placement and IV pain medicine.  By 3/28, patient much  improved today despite getting continued pain medication is alert and oriented x 3  Severe sepsis secondary to intra-abdominal abscess in patient with acute on chronic abdominal pain.-Resolved Patient met criteria for severe sepsis given fever, leukocytosis, AKI creatinine greater than 2 and abdominal source.  CT abdomen shows evidence of a 5 x 5 cm fluid collection in the left hemipelvis concerning for developing abscess.  GYN was consulted.  Discussed with Dr. Roselie Awkward. She has not had sex in 4 years and the mass was not present on CT 3/15.  GYN does not think that the patient is suffering from a tubo-ovarian abscess.  Patient underwent IR guided drain placement on 3/26.  Following drain placement and antibiotic adjustment, white blood cell count has continued to improve.  White blood cell count and procalcitonin initially improved.  Patient now with white blood cell count increase from 13 on 3/29 to 22 on 3/30.  Given that she is still on strong antibiotics, doubtful this could be a new acute infection.  May be stress margination due to ileus/small bowel obstruction.  Nevertheless, nursing reporting urine with pinkish hue.  Checking urinalysis plus culture.  SBO versus ileus Correct electrolytes.  Slight decrease in pain medication.  AKI-resolved. Suspect ATN in the setting of sepsis, possibility of contrast-induced injury as well as prerenal etiology in the setting of poor p.o. intake.  Improving with IV fluids. Initiate with IV fluid.  Monitor closely due to history of CHF.  Much improved by 3/28  Hypersomnolence. OHS. Obesity class III. Body mass index is 52.46 kg/m.  Placing the patient at high risk of poor outcome. Patient was recommended to have an outpatient sleep evaluation during her recent hospitalization.  Chronic combined systolic and diastolic CHF. Echocardiogram done during this hospitalization notes much improvement in  ejection fraction and indeterminate diastolic function.   BNP within normal limits  Gastroparesis. Patient is not on any medication for gastroparesis.  Tolerating p.o. Still reporting some nausea and vomiting.  Intolerant of Reglan.  Abdominal x-ray notes multiple dilated air-filled loops.  Will replace electrolytes accordingly. Currently on Zofran. Will resume home scopolamine patch.  History of PE. Had a PE last October, told that she needs to be on Eliquis indefinitely.  Type 2 diabetes mellitus, uncontrolled with hyperglycemia with gastroparesis. With long-term insulin use. Patient is actually on U-500 insulin at home. Due to full liquids only and minimum intake, have added dextrose to IV fluids, currently on once a day regimen of long-acting insulin. Continue sliding scale insulin.   Lupus and Sjogren's syndrome. Patient is on hydroxychloroquine, belimumab as well as Imuran. Patient remains at risk for infections. Monitor for now.  Anxiety and depression. Continue home regimen.  Hypothyroidism. Continue Synthroid.  Overactive bladder. On Ditropan, currently held  GERD. Continue PPI.  Diabetic neuropathy. On Lyrica. Due to encephalopathy we will hold it for now.  Restart slowly  Hyperlipidemia. Continue Crestor.  Oral candidiasis. Will treat with Diflucan.  Subjective: Complains of some nausea and vomiting  Physical Exam: General: Oriented x 3, no acute distress Cardiovascular: Regular rate and rhythm, S1-S2 Lungs: Decreased breath sounds throughout secondary to body habitus Abdomen: Soft, obese, mild generalized nonspecific tenderness, absent bowel sounds Extremities: No clubbing or cyanosis, trace pitting edema  Data Reviewed: White blood cell count at 22.  Disposition: Status is: Inpatient Remains inpatient appropriate because: -IV antibiotics and improvement in infection -taking in PO  apixaban (ELIQUIS) tablet 2.5 mg Start: 10/25/22 2200 Place and maintain sequential compression device Start: 10/25/22  1103 apixaban (ELIQUIS) tablet 2.5 mg   Family Communication: Updated sister by phone Level of care: Telemetry Medical   Vitals:   10/28/22 2323 10/29/22 0358 10/29/22 0726 10/29/22 1120  BP: (!) 145/93 138/78 (!) 145/88 129/84  Pulse: 96 98 98 92  Resp: 18 18 19 20   Temp: 98.2 F (36.8 C) 99.4 F (37.4 C) 98.9 F (37.2 C) 98.5 F (36.9 C)  TempSrc: Oral Axillary Axillary Axillary  SpO2: 100% 98% 95% 98%  Weight:      Height:         Author: Annita Brod, MD 10/29/2022 2:49 PM  Please look on [email or web address] to find out who is on call.

## 2022-10-29 NOTE — Progress Notes (Addendum)
Pharmacy Antibiotic Note  [name] is a 52 y.o. female admitted on 10/23/2022 with syncopal event and concern for intra-abdominal infection/pelvic abscess . Pt was previously on CTX, doxycycline, Flagyl.  Pharmacy has been consulted for meropenem dosing. Doxycycline was preferred over vancomycin given recent AKI. AKI has now resolved and Scr downtrending 2.29 >> 0.93. Currently afebrile with WBC trending down (24>13.1).  Plan: Continue meropenem 2g IV q8h.   Continue doxycycline 100mg  IV q12h.  Monitor clinical status, LOT, cx, renal function.  Height: 5\' 8"  (172.7 cm) Weight: (!) 156.5 kg (345 lb 0.3 oz) IBW/kg (Calculated) : 63.9  Temp (24hrs), Avg:98.8 F (37.1 C), Min:98.2 F (36.8 C), Max:99.4 F (37.4 C)  Recent Labs  Lab 10/24/22 0643 10/25/22 0722 10/26/22 0406 10/27/22 0724 10/28/22 0728  WBC 31.1* 34.6* 24.0* 22.8* 13.1*  CREATININE 0.97 2.29* 1.92* 1.29* 0.93    Estimated Creatinine Clearance: 112.7 mL/min (by C-G formula based on SCr of 0.93 mg/dL).    Allergies  Allergen Reactions   Metoclopramide Other (See Comments)    Developed restless leg, akathisia type limb movements.    Codeine Itching and Rash   Hydrocodone Rash   Penicillins Itching    Tolerated Unasyn 02/01/2020   Percocet [Oxycodone-Acetaminophen] Hives and Rash    Tolerates dilaudid Tolerates acetaminophen    Antimicrobials this admission: CTX 3/25 >> 3/26 Doxy 3/25 >>  Flagyl 3/25 >> 3/26 Merrem 3/26 >> Fluconazole 3/26 >> 4/1  Dose adjustments this admission: Meropenem 2g IV q12h >> 2g IV q8h  Microbiology results: 3/26 BCx: NGTD x4 3/26 abscess cx: gram stain: abundant GNR, GPC pairs in chains - reincubated  Thank you for allowing pharmacy to be a part of this patient's care.  Leticia Clas, PharmD Candidate 10/29/2022 9:59 AM

## 2022-10-29 NOTE — Progress Notes (Signed)
Patient morning CBG 107, not requiring insulin coverage, patient had 100units semglee ordered, but did not want to eat breakfast. MD notified, semglee held per MD

## 2022-10-29 NOTE — Plan of Care (Signed)
  Problem: Education: Goal: Ability to describe self-care measures that may prevent or decrease complications (Diabetes Survival Skills Education) will improve Outcome: Progressing Goal: Individualized Educational Video(s) Outcome: Progressing   Problem: Coping: Goal: Ability to adjust to condition or change in health will improve Outcome: Progressing   Problem: Fluid Volume: Goal: Ability to maintain a balanced intake and output will improve Outcome: Progressing   Problem: Health Behavior/Discharge Planning: Goal: Ability to identify and utilize available resources and services will improve Outcome: Progressing Goal: Ability to manage health-related needs will improve Outcome: Progressing   Problem: Metabolic: Goal: Ability to maintain appropriate glucose levels will improve Outcome: Progressing   Problem: Skin Integrity: Goal: Risk for impaired skin integrity will decrease Outcome: Progressing   Problem: Tissue Perfusion: Goal: Adequacy of tissue perfusion will improve Outcome: Progressing   Problem: Education: Goal: Knowledge of General Education information will improve Description: Including pain rating scale, medication(s)/side effects and non-pharmacologic comfort measures Outcome: Progressing   Problem: Health Behavior/Discharge Planning: Goal: Ability to manage health-related needs will improve Outcome: Progressing   Problem: Clinical Measurements: Goal: Ability to maintain clinical measurements within normal limits will improve Outcome: Progressing Goal: Will remain free from infection Outcome: Progressing Goal: Diagnostic test results will improve Outcome: Progressing Goal: Respiratory complications will improve Outcome: Progressing Goal: Cardiovascular complication will be avoided Outcome: Progressing   Problem: Activity: Goal: Risk for activity intolerance will decrease Outcome: Progressing   Problem: Nutrition: Goal: Adequate nutrition will be  maintained Outcome: Progressing   Problem: Coping: Goal: Level of anxiety will decrease Outcome: Progressing   Problem: Elimination: Goal: Will not experience complications related to bowel motility Outcome: Progressing Goal: Will not experience complications related to urinary retention Outcome: Progressing   Problem: Pain Managment: Goal: General experience of comfort will improve Outcome: Progressing   Problem: Safety: Goal: Ability to remain free from injury will improve Outcome: Progressing   Problem: Skin Integrity: Goal: Risk for impaired skin integrity will decrease Outcome: Progressing

## 2022-10-29 NOTE — Progress Notes (Signed)
Patient had pink cloudy urine with sediment in suction cannister, patient denies pain, MD notified  New orders for UA placed.

## 2022-10-30 DIAGNOSIS — K56609 Unspecified intestinal obstruction, unspecified as to partial versus complete obstruction: Secondary | ICD-10-CM | POA: Diagnosis not present

## 2022-10-30 DIAGNOSIS — I5022 Chronic systolic (congestive) heart failure: Secondary | ICD-10-CM | POA: Diagnosis not present

## 2022-10-30 DIAGNOSIS — G9340 Encephalopathy, unspecified: Secondary | ICD-10-CM | POA: Diagnosis not present

## 2022-10-30 DIAGNOSIS — D72829 Elevated white blood cell count, unspecified: Secondary | ICD-10-CM | POA: Diagnosis not present

## 2022-10-30 LAB — BASIC METABOLIC PANEL
Anion gap: 10 (ref 5–15)
BUN: 9 mg/dL (ref 6–20)
CO2: 19 mmol/L — ABNORMAL LOW (ref 22–32)
Calcium: 8.5 mg/dL — ABNORMAL LOW (ref 8.9–10.3)
Chloride: 107 mmol/L (ref 98–111)
Creatinine, Ser: 0.76 mg/dL (ref 0.44–1.00)
GFR, Estimated: >60 mL/min (ref >60)
Glucose, Bld: 152 mg/dL — ABNORMAL HIGH (ref 70–99)
Potassium: 5.1 mmol/L (ref 3.5–5.1)
Sodium: 136 mmol/L (ref 135–145)

## 2022-10-30 LAB — CULTURE, BLOOD (ROUTINE X 2)
Culture: NO GROWTH
Culture: NO GROWTH
Special Requests: ADEQUATE

## 2022-10-30 LAB — CBC
HCT: 24 % — ABNORMAL LOW (ref 36.0–46.0)
Hemoglobin: 8.7 g/dL — ABNORMAL LOW (ref 12.0–15.0)
MCH: 30 pg (ref 26.0–34.0)
MCHC: 36.3 g/dL — ABNORMAL HIGH (ref 30.0–36.0)
MCV: 82.8 fL (ref 80.0–100.0)
Platelets: 395 10*3/uL (ref 150–400)
RBC: 2.9 MIL/uL — ABNORMAL LOW (ref 3.87–5.11)
RDW: 13.3 % (ref 11.5–15.5)
WBC: 23.3 10*3/uL — ABNORMAL HIGH (ref 4.0–10.5)
nRBC: 0.1 % (ref 0.0–0.2)

## 2022-10-30 LAB — GLUCOSE, CAPILLARY
Glucose-Capillary: 135 mg/dL — ABNORMAL HIGH (ref 70–99)
Glucose-Capillary: 156 mg/dL — ABNORMAL HIGH (ref 70–99)
Glucose-Capillary: 157 mg/dL — ABNORMAL HIGH (ref 70–99)
Glucose-Capillary: 171 mg/dL — ABNORMAL HIGH (ref 70–99)
Glucose-Capillary: 175 mg/dL — ABNORMAL HIGH (ref 70–99)
Glucose-Capillary: 176 mg/dL — ABNORMAL HIGH (ref 70–99)

## 2022-10-30 LAB — URINALYSIS, W/ REFLEX TO CULTURE (INFECTION SUSPECTED)
Bilirubin Urine: NEGATIVE
Glucose, UA: NEGATIVE mg/dL
Hgb urine dipstick: NEGATIVE
Ketones, ur: 5 mg/dL — AB
Leukocytes,Ua: NEGATIVE
Nitrite: NEGATIVE
Protein, ur: 30 mg/dL — AB
Specific Gravity, Urine: 1.031 — ABNORMAL HIGH (ref 1.005–1.030)
WBC, UA: >50 WBC/hpf (ref 0–5)
pH: 5 (ref 5.0–8.0)

## 2022-10-30 LAB — PROCALCITONIN: Procalcitonin: 0.68 ng/mL

## 2022-10-30 MED ORDER — INSULIN GLARGINE-YFGN 100 UNIT/ML ~~LOC~~ SOLN
10.0000 [IU] | Freq: Every day | SUBCUTANEOUS | Status: DC
  Administered 2022-10-31 – 2022-11-10 (×11): 10 [IU] via SUBCUTANEOUS
  Filled 2022-10-30 (×11): qty 0.1

## 2022-10-30 MED ORDER — ENSURE ENLIVE PO LIQD
237.0000 mL | Freq: Two times a day (BID) | ORAL | Status: DC
  Administered 2022-10-30 – 2022-10-31 (×3): 237 mL via ORAL

## 2022-10-30 MED ORDER — SODIUM CHLORIDE 0.9 % IV SOLN
3.0000 g | Freq: Four times a day (QID) | INTRAVENOUS | Status: DC
  Administered 2022-10-30 – 2022-11-11 (×47): 3 g via INTRAVENOUS
  Filled 2022-10-30 (×46): qty 8

## 2022-10-30 NOTE — Plan of Care (Signed)
Problem: Education: Goal: Ability to describe self-care measures that may prevent or decrease complications (Diabetes Survival Skills Education) will improve 10/30/2022 1927 by Laure Kidney, RN Outcome: Progressing 10/30/2022 1926 by Laure Kidney, RN Outcome: Progressing Goal: Individualized Educational Video(s) 10/30/2022 1927 by Laure Kidney, RN Outcome: Progressing 10/30/2022 1926 by Laure Kidney, RN Outcome: Progressing   Problem: Coping: Goal: Ability to adjust to condition or change in health will improve 10/30/2022 1927 by Laure Kidney, RN Outcome: Progressing 10/30/2022 1926 by Laure Kidney, RN Outcome: Progressing   Problem: Fluid Volume: Goal: Ability to maintain a balanced intake and output will improve 10/30/2022 1927 by Laure Kidney, RN Outcome: Progressing 10/30/2022 1926 by Laure Kidney, RN Outcome: Progressing   Problem: Health Behavior/Discharge Planning: Goal: Ability to identify and utilize available resources and services will improve 10/30/2022 1927 by Laure Kidney, RN Outcome: Progressing 10/30/2022 1926 by Laure Kidney, RN Outcome: Progressing Goal: Ability to manage health-related needs will improve 10/30/2022 1927 by Laure Kidney, RN Outcome: Progressing 10/30/2022 1926 by Laure Kidney, RN Outcome: Progressing   Problem: Metabolic: Goal: Ability to maintain appropriate glucose levels will improve 10/30/2022 1927 by Laure Kidney, RN Outcome: Progressing 10/30/2022 1926 by Laure Kidney, RN Outcome: Progressing   Problem: Nutritional: Goal: Progress toward achieving an optimal weight will improve 10/30/2022 1927 by Laure Kidney, RN Outcome: Progressing 10/30/2022 1926 by Laure Kidney, RN Outcome: Progressing   Problem: Skin Integrity: Goal: Risk for impaired skin integrity will decrease 10/30/2022 1927 by Laure Kidney, RN Outcome: Progressing 10/30/2022 1926 by Laure Kidney, RN Outcome: Progressing   Problem: Tissue  Perfusion: Goal: Adequacy of tissue perfusion will improve 10/30/2022 1927 by Laure Kidney, RN Outcome: Progressing 10/30/2022 1926 by Laure Kidney, RN Outcome: Progressing   Problem: Education: Goal: Knowledge of General Education information will improve Description: Including pain rating scale, medication(s)/side effects and non-pharmacologic comfort measures 10/30/2022 1927 by Laure Kidney, RN Outcome: Progressing 10/30/2022 1926 by Laure Kidney, RN Outcome: Progressing   Problem: Health Behavior/Discharge Planning: Goal: Ability to manage health-related needs will improve 10/30/2022 1927 by Laure Kidney, RN Outcome: Progressing 10/30/2022 1926 by Laure Kidney, RN Outcome: Progressing   Problem: Clinical Measurements: Goal: Ability to maintain clinical measurements within normal limits will improve 10/30/2022 1927 by Laure Kidney, RN Outcome: Progressing 10/30/2022 1926 by Laure Kidney, RN Outcome: Progressing Goal: Diagnostic test results will improve 10/30/2022 1927 by Laure Kidney, RN Outcome: Progressing 10/30/2022 1926 by Laure Kidney, RN Outcome: Progressing Goal: Respiratory complications will improve 10/30/2022 1927 by Laure Kidney, RN Outcome: Progressing 10/30/2022 1926 by Laure Kidney, RN Outcome: Progressing Goal: Cardiovascular complication will be avoided 10/30/2022 1927 by Laure Kidney, RN Outcome: Progressing 10/30/2022 1926 by Laure Kidney, RN Outcome: Progressing   Problem: Coping: Goal: Level of anxiety will decrease 10/30/2022 1927 by Laure Kidney, RN Outcome: Progressing 10/30/2022 1926 by Laure Kidney, RN Outcome: Progressing   Problem: Elimination: Goal: Will not experience complications related to urinary retention 10/30/2022 1927 by Laure Kidney, RN Outcome: Progressing 10/30/2022 1926 by Laure Kidney, RN Outcome: Progressing   Problem: Safety: Goal: Ability to remain free from injury will improve 10/30/2022  1927 by Laure Kidney, RN Outcome: Progressing 10/30/2022 1926 by Laure Kidney, RN Outcome: Progressing   Problem: Skin Integrity: Goal: Risk for impaired skin integrity will decrease 10/30/2022 1927 by Rogers Blocker,  Luvena Wentling C, RN Outcome: Progressing 10/30/2022 1926 by Laure Kidney, RN Outcome: Progressing

## 2022-10-30 NOTE — Progress Notes (Signed)
Abscess culture grew out group B strep, strep anginosis, and prevotella with beta lactamase positive. Team curbside consult Dr. Baxter Flattery and we will optimize abx to just Unasyn since it will cover these organisms.   Unasyn 3g IV q6  Onnie Boer, PharmD, [place], AAHIVP, CPP Infectious Disease Pharmacist 10/30/2022 3:32 PM

## 2022-10-30 NOTE — Progress Notes (Signed)
[facility] Progress Note Patient: [name] [id number] DOB: [birth year] DOA: 10/23/2022  DOS: the patient was seen and examined on 10/30/2022  Brief hospital course: 52 year old African-American female with past medical history of morbid obesity with BMI greater than 50, systolic/diastolic CHF with previous ejection fraction of 30 to 35% and grade 1 diastolic dysfunction, chronic right ventricular failure, history of DVT, hypothyroidism, depression and anxiety and chronic abdominal pain who presented to the emergency room on 3/24 with complaints of lightheadedness and abdominal pain.  She was also found to be somewhat confused.  Workup revealed severe sepsis secondary to intra-abdominal abscess concerning for PID/TOA and case discussed with gynecology.  Mass not present on CT on 3/15.  Interventional radiology consulted and patient underwent drain placement 3/26.  Mentation much improved.  Patient had been on a limited diet full liquids due to complaints of abdominal pain.  Started having some nausea and vomiting 3/29.  Abdominal x-ray noted dilated small bowel loops concerning for developing ileus.  Symptoms have persisted and repeat x-ray on 3/30 confirms SBO versus ileus.  Assessment and Plan: Syncope. Acute metabolic encephalopathy/hypersomnolence-resolved Initially presented with what appears to be a syncopal event. There was a concern for seizures, however EEG was unremarkable.  Ammonia level, TSH, HIV, RPR all unremarkable.  VBG negative for hypercarbia.  Neurology evaluated and felt findings unrelated to any neurological issue.  Initial concern this could be secondary to pain medication.  Patient's IV pain medicine was held and she became more appropriate and alert.  Felt to be multifactorial secondary to decreased clearance from acute kidney injury, additional anesthesia and pain medication from intervention radiology for drain placement and IV pain medicine.  By 3/28, patient much  improved today despite getting continued pain medication is alert and oriented x 3  Severe sepsis secondary to intra-abdominal abscess in patient with acute on chronic abdominal pain.-Resolved Patient met criteria for severe sepsis given fever, leukocytosis, AKI creatinine greater than 2 and abdominal source.  CT abdomen shows evidence of a 5 x 5 cm fluid collection in the left hemipelvis concerning for developing abscess.  GYN was consulted.  Discussed with Dr. Roselie Awkward. She has not had sex in 4 years and the mass was not present on CT 3/15.  GYN does not think that the patient is suffering from a tubo-ovarian abscess.  Patient underwent IR guided drain placement on 3/26.  Following drain placement and antibiotic adjustment, white blood cell count has continued to improve.  White blood cell count and procalcitonin initially improved.  Now with minimal output.  Will discuss with interventional radiology about drain removal  UTI: Patient's white blood cell count initially improved following antibiotics and drain placement, down to 13 on 3/29.  However, since then has been steadily rising, currently at 62.  Urinalysis l looks to be positive for UTI, spite the meropenem and doxycycline.  Discussed with infectious disease for antibiotic weigh-in.  SBO versus ileus Persisting.  Treating underlying infection and electrolyte abnormalities.  Have changed NPO.  AKI-resolved. Suspect ATN in the setting of sepsis, possibility of contrast-induced injury as well as prerenal etiology in the setting of poor p.o. intake.  Improving with IV fluids. Initiate with IV fluid.  Monitor closely due to history of CHF.  Much improved by 3/28  Hypersomnolence. OHS. Obesity class III. Body mass index is 53.77 kg/m.  Placing the patient at high risk of poor outcome. Patient was recommended to have an outpatient sleep evaluation during her recent hospitalization.  Chronic combined  systolic and diastolic CHF. Echocardiogram  done during this hospitalization notes much improvement in ejection fraction and indeterminate diastolic function.  BNP within normal limits  Gastroparesis. Patient is not on any medication for gastroparesis.  Tolerating p.o. Still reporting some nausea and vomiting.  Intolerant of Reglan.  Abdominal x-ray notes multiple dilated air-filled loops.  Will replace electrolytes accordingly. Currently on Zofran. Will resume home scopolamine patch.  History of PE. Had a PE last October, told that she needs to be on Eliquis indefinitely.  Type 2 diabetes mellitus, uncontrolled with hyperglycemia with gastroparesis. With long-term insulin use. Patient is actually on U-500 insulin at home. Now with SBO/ileus, decreased long-acting insulin down to 10 units only plus Q for her sliding scale.  Lupus and Sjogren's syndrome. Patient is on hydroxychloroquine, belimumab as well as Imuran. Patient remains at risk for infections. Monitor for now.  Anxiety and depression. Continue home regimen.  Hypothyroidism. Continue Synthroid.  Overactive bladder. On Ditropan, currently held  GERD. Continue PPI.  Diabetic neuropathy. On Lyrica. Due to encephalopathy we will hold it for now.  Restart slowly  Hyperlipidemia. Continue Crestor.  Oral candidiasis. Will treat with Diflucan.  Subjective: Continues to have nausea and vomiting.  Physical Exam: General: Oriented x 3, no acute distress Cardiovascular: Regular rate and rhythm, S1-S2 Lungs: Decreased breath sounds throughout secondary to body habitus Abdomen: Soft, obese, mild generalized nonspecific tenderness, absent bowel sounds Extremities: No clubbing or cyanosis, trace pitting edema  Data Reviewed: Procalcitonin pending.  White blood cell count continues to trend upward, currently at 23  Disposition: Status is: Inpatient Remains inpatient appropriate because: Adjustment to antibiotics Improvement in infection Resolution of  SBO/ileus Removal of drain  apixaban (ELIQUIS) tablet 2.5 mg Start: 10/25/22 2200 Place and maintain sequential compression device Start: 10/25/22 1103 apixaban (ELIQUIS) tablet 2.5 mg   Family Communication: Updated sister by phone Level of care: Telemetry Medical   Vitals:   10/30/22 0500 10/30/22 0737 10/30/22 1213 10/30/22 1219  BP:  123/68 128/67   Pulse:  94 96 97  Resp:  19 20   Temp:  99.2 F (37.3 C) 99.1 F (37.3 C) 98.3 F (36.8 C)  TempSrc:  Oral Oral Oral  SpO2:  95% 98% 98%  Weight: (!) 160.4 kg     Height:         Author: Annita Brod, MD 10/30/2022 3:16 PM  Please look on [email or web address] to find out who is on call.

## 2022-10-31 DIAGNOSIS — I5022 Chronic systolic (congestive) heart failure: Secondary | ICD-10-CM | POA: Diagnosis not present

## 2022-10-31 DIAGNOSIS — G9340 Encephalopathy, unspecified: Secondary | ICD-10-CM | POA: Diagnosis not present

## 2022-10-31 DIAGNOSIS — K651 Peritoneal abscess: Secondary | ICD-10-CM | POA: Diagnosis not present

## 2022-10-31 DIAGNOSIS — K56609 Unspecified intestinal obstruction, unspecified as to partial versus complete obstruction: Secondary | ICD-10-CM | POA: Diagnosis not present

## 2022-10-31 LAB — GLUCOSE, CAPILLARY
Glucose-Capillary: 146 mg/dL — ABNORMAL HIGH (ref 70–99)
Glucose-Capillary: 148 mg/dL — ABNORMAL HIGH (ref 70–99)
Glucose-Capillary: 153 mg/dL — ABNORMAL HIGH (ref 70–99)
Glucose-Capillary: 154 mg/dL — ABNORMAL HIGH (ref 70–99)
Glucose-Capillary: 160 mg/dL — ABNORMAL HIGH (ref 70–99)
Glucose-Capillary: 163 mg/dL — ABNORMAL HIGH (ref 70–99)

## 2022-10-31 LAB — CBC
HCT: 24.2 % — ABNORMAL LOW (ref 36.0–46.0)
Hemoglobin: 8.4 g/dL — ABNORMAL LOW (ref 12.0–15.0)
MCH: 29.6 pg (ref 26.0–34.0)
MCHC: 34.7 g/dL (ref 30.0–36.0)
MCV: 85.2 fL (ref 80.0–100.0)
Platelets: 418 10*3/uL — ABNORMAL HIGH (ref 150–400)
RBC: 2.84 MIL/uL — ABNORMAL LOW (ref 3.87–5.11)
RDW: 13.8 % (ref 11.5–15.5)
WBC: 22.7 10*3/uL — ABNORMAL HIGH (ref 4.0–10.5)
nRBC: 0.1 % (ref 0.0–0.2)

## 2022-10-31 LAB — URINE CULTURE: Culture: <10000 — AB

## 2022-10-31 LAB — BASIC METABOLIC PANEL
Anion gap: 6 (ref 5–15)
BUN: 10 mg/dL (ref 6–20)
CO2: 20 mmol/L — ABNORMAL LOW (ref 22–32)
Calcium: 8 mg/dL — ABNORMAL LOW (ref 8.9–10.3)
Chloride: 106 mmol/L (ref 98–111)
Creatinine, Ser: 0.76 mg/dL (ref 0.44–1.00)
GFR, Estimated: >60 mL/min (ref >60)
Glucose, Bld: 162 mg/dL — ABNORMAL HIGH (ref 70–99)
Potassium: 3.5 mmol/L (ref 3.5–5.1)
Sodium: 132 mmol/L — ABNORMAL LOW (ref 135–145)

## 2022-10-31 LAB — PROCALCITONIN: Procalcitonin: 0.45 ng/mL

## 2022-10-31 NOTE — TOC Progression Note (Signed)
Transition of Care [facility]) - Progression Note    Patient Details  Name: [name] MRN: [id number] Date of Birth: 11-27-1970  [facility]) CM/SW Contact  Pollie Friar, RN Phone Number: 10/31/2022, 10:09 AM  Clinical Narrative:    Pt continues with ileus and on IV abx.  Plan continues to be home when medically ready. TOC following.   Expected Discharge Plan:  (may need SNF) Barriers to Discharge: Continued Medical Work up  Expected Discharge Plan and Services       Living arrangements for the past 2 months: Single Family Home                                       Social Determinants of Health (SDOH) Interventions SDOH Screenings   Food Insecurity: No Food Insecurity (10/27/2022)  Housing: Low Risk  (10/27/2022)  Transportation Needs: No Transportation Needs (10/27/2022)  Utilities: Not At Risk (10/27/2022)  Tobacco Use: Medium Risk (10/24/2022)    Readmission Risk Interventions    05/26/2021    4:02 PM  Readmission Risk Prevention Plan  Transportation Screening Complete  PCP or Specialist appointment within 3-5 days of discharge Complete  HRI or [place] Not Applicable

## 2022-10-31 NOTE — Consult Note (Addendum)
[facility] Disease    Date of Admission:  10/23/2022   Total days of inpatient antibiotics 7        Reason for Consult: Pelvic abscess    Principal Problem:   Acute encephalopathy Active Problems:   SJOGREN'S SYNDROME   Hypertension complicating diabetes   Chronic systolic CHF (congestive heart failure)   QT prolongation   Type 2 diabetes mellitus with hyperlipidemia   History of CVA (cerebrovascular accident)   Major depressive disorder with single episode, in partial remission   SLE (systemic lupus erythematosus related syndrome)   PID (acute pelvic inflammatory disease)   Assessment: 52 year old female with pelvic abscess on immunosuppression  #Pelvic abscess status post CT-guided drainage with drain placement #Recurrent N/V #Leukocytosis 2/2 #1 #Fever-resolved - 3/24 CT abdomen pelvis with contrast showed diffuse inflammatory changes and pelvic stranding, new since prior CT on 3/15.  Concern for PID with developing abscess, small amount of hemorrhagic content may be present pelvis possibility of hemorrhagic cyst that is ruptured.  Ultrasound recommended for further evaluation.  On Doxy, Merrem. - Ultrasound was technically difficult. - Patient underwent CT-guided drainage with IR on 3/26,  left tubo-ovarian fluid collection was localized, pigtail catheter placed with 60 mL fluid aspirated.  Cultures grew strep anginosus, Prevotella bivia. Patient has been transitioned to Unasyn - Of note: Patient reports she has not been sexually active since 2 years.  GC urine negative on 3/24 RPR nonreactive on 3/25, HIV negative on 3/25. Recommendations:  -Continue Unasyn, monitor clinically.   -Hold Benlysta/Imuran(Followed by Lorenza Cambridge, MD at [facility]).  I called [facility], spoke with staff nurse and left message to notify provider that we are holding Imuran and benlysta.  Benlysta has been noted to lead to severe infections, it appears patient has  been on it since mid 2021.  Given her history of recurrent nausea and vomiting while being on 2 immunosuppressive agents may have led to clinical situation leading to pelvic abscess.  #Oral candidiasis - On Diflucan #History of recurrent nausea and vomiting since at least 2014, multiple hospitalizations --Of note patient has had multiple hospitalizations for nausea and vomiting, etiology seems to be unclear.  Looks like during 3/16 hospitalization patient had declined upper endoscopy.  She has had 3 EGDs previously, most recent was less than 2 years ago.  Suspicion for any significant pathology or malignancy was extremely low per GIs note on 3/19.   #Lupus(+ ANA, + SSA, SICCA symptoms, alopecia, oral ulcers, arthralgia) on immunosuppression -Followed by Dr. Ulla Gallo, MD at [facility], last seen on 10/10/2022.  Patient was on Imuran 150 mg p.o. twice daily, Plaquenil 200 twice daily, Benlysta subq weekly at that point.  Noted to be doing well at that time.  I have personally spent 1120  minutes involved in face-to-face and non-face-to-face activities for this patient on the day of the visit. Professional time spent includes the following activities: Preparing to see the patient (review of tests), Obtaining and/or reviewing separately obtained history (admission/discharge record), Performing a medically appropriate examination and/or evaluation , Ordering medications/tests/procedures, referring and communicating with other health care professionals, Documenting clinical information in the EMR, Independently interpreting results (not separately reported), Communicating results to the patient/family/caregiver, Counseling and educating the patient/family/caregiver and Care coordination (not separately reported).   Microbiology:   Antibiotics: Fluconazole 3/26-p Doxy 3/24-p Augmentin 3/31-p Ceftriaxone 3/24-3/25 Metro 3/24-3/26 Merrem 3/26-3/31   Cultures: Blood 3/26 NG  Other 3/26  strep anginosus and prevotella bivia  Urine  3/31 <10k colonies   HPI: [name] is a 52 y.o. female with history of SLE on immunosuppression, morbid obesity, systolic/diastolic heart failure, history of DVT, hypothyroidism, depression, anxiety, recurrent nausea vomiting with abdominal pain, with recent hospitalization for nausea/vomiting.  Presented to ED as chlamydia lightheadedness abdominal pain.  Found to be confused.  Patient had a fever with leukocytosis on presentation CT revealed possible new pelvic abscess.  She was started on Merrem, Doxy, fluconazole.  Per documentation(Primary) Gyn did not think patient is suffering from tubo-ovarian abscess.  3/26 underwent drain placement.   Review of Systems: Review of Systems  All other systems reviewed and are negative.   Past Medical History:  Diagnosis Date   Acute renal failure (HCC)     Intractable nausea vomiting secondary to diabetic gastroparesis causing dehydration and acute renal failure /notes 04/01/2013   Anginal pain ([place])    Anxiety    Bell's palsy 08/09/2010   Bicornuate uterus    CAP (community acquired pneumonia) 10/2011   Archie Endo 11/08/2011   Chest pain 01/13/2014   CHF (congestive heart failure) ([place])    Diabetic gastroparesis associated with type 2 diabetes mellitus ([place])    this is presumed diagnoses, not confirmed by any studies.    Eczema    Family history of breast cancer    Family history of malignant neoplasm of breast    Family history of malignant neoplasm of ovary    Family history of ovarian cancer    Family history of prostate cancer    GERD (gastroesophageal reflux disease)    High cholesterol    Hypertension    Liver lesion 08/13/2019   Liver mass 2014   biopsied 03/2013 at [facility], not malignant.  is to undergo radiologic ablation of the mass in sept/October 2014.    Lupus ([place])    Migraines    "maybe a couple times/yr" (04/01/2013)   Obesity    Obstructive sleep apnea on CPAP 2011    Oxygen at nights   Pulmonary embolism ([place]) 05/21/2021   SJOGREN'S SYNDROME 08/09/2010   SLE (systemic lupus erythematosus) ([place])    Archie Endo 12/01/2010    Social History   Tobacco Use   Smoking status: Former    Packs/day: 0.50    Years: 3.00    Additional pack years: 0.00    Total pack years: 1.50    Types: Cigarettes    Quit date: 06/02/1991    Years since quitting: 31.4   Smokeless tobacco: Never   Tobacco comments:    quit 28 yrs ago  Vaping Use   Vaping Use: Never used  Substance Use Topics   Alcohol use: No    Alcohol/week: 0.0 standard drinks of alcohol   Drug use: No    Family History  Problem Relation Age of Onset   Diabetes Mother    Asthma Mother    Urticaria Mother    Hypertension Mother    Stroke Mother    Ovarian cancer Sister 56       maternal half-sister; RAD51C positive   Asthma Brother    Asthma Brother    Breast cancer Maternal Aunt 53       currently 38   Cancer Maternal Aunt        unk. primary   Cancer Maternal Aunt        unk. primary   Ovarian cancer Maternal Aunt        deceased 47s  Breast cancer Maternal Aunt        deceased 35s   Breast cancer Maternal Aunt    Stomach cancer Maternal Uncle        dx 74s; deceased   Prostate cancer Maternal Uncle        deceased 56s   Breast cancer Cousin        deceased 60s; daughter of mat uncle with stomach ca   Hypertension Other    Stroke Other    Arthritis Other    Allergic rhinitis Neg Hx    Angioedema Neg Hx    Scheduled Meds:  apixaban  2.5 mg Oral BID   carvedilol  3.125 mg Oral BID WC   feeding supplement  237 mL Oral BID BM   insulin aspart  0-9 Units Subcutaneous Q4H   insulin glargine-yfgn  10 Units Subcutaneous Daily   levothyroxine  50 mcg Oral Q0600   pantoprazole  40 mg Oral Daily   QUEtiapine  200 mg Oral QHS   sodium chloride flush  3 mL Intravenous Q12H   sodium chloride flush  5 mL Intracatheter Q8H   Continuous Infusions:  ampicillin-sulbactam (UNASYN) IV 3 g  (10/31/22 1439)   dextrose 5 % and 0.9% NaCl 75 mL/hr at 10/31/22 1012   PRN Meds:.acetaminophen **OR** acetaminophen, HYDROmorphone (DILAUDID) injection, hydrOXYzine, ondansetron (ZOFRAN) IV, senna-docusate Allergies  Allergen Reactions   Metoclopramide Other (See Comments)    Developed restless leg, akathisia type limb movements.    Codeine Itching and Rash   Hydrocodone Rash   Penicillins Itching    Tolerated Unasyn 02/01/2020   Percocet [Oxycodone-Acetaminophen] Hives and Rash    Tolerates dilaudid Tolerates acetaminophen     OBJECTIVE: Blood pressure 132/83, pulse 87, temperature 98.6 F (37 C), temperature source Oral, resp. rate 20, height 5\' 8"  (1.727 m), weight (!) 164 kg, last menstrual period 03/24/2017, SpO2 99 %.  Physical Exam Constitutional:      Appearance: Normal appearance.  HENT:     Head: Normocephalic and atraumatic.     Right Ear: Tympanic membrane normal.     Left Ear: Tympanic membrane normal.     Nose: Nose normal.     Mouth/Throat:     Mouth: Mucous membranes are moist.  Eyes:     Extraocular Movements: Extraocular movements intact.     Conjunctiva/sclera: Conjunctivae normal.     Pupils: Pupils are equal, round, and reactive to light.  Cardiovascular:     Rate and Rhythm: Normal rate and regular rhythm.     Heart sounds: No murmur heard.    No friction rub. No gallop.  Pulmonary:     Effort: Pulmonary effort is normal.     Breath sounds: Normal breath sounds.  Abdominal:     General: Abdomen is flat.     Palpations: Abdomen is soft.  Musculoskeletal:        General: Normal range of motion.  Skin:    General: Skin is warm and dry.  Neurological:     General: No focal deficit present.     Mental Status: She is alert and oriented to person, place, and time.  Psychiatric:        Mood and Affect: Mood normal.     Lab Results Lab Results  Component Value Date   WBC 22.7 (H) 10/31/2022   HGB 8.4 (L) 10/31/2022   HCT 24.2 (L) 10/31/2022    MCV 85.2 10/31/2022   PLT 418 (H) 10/31/2022    Lab Results  Component  Value Date   CREATININE 0.76 10/31/2022   BUN 10 10/31/2022   NA 132 (L) 10/31/2022   K 3.5 10/31/2022   CL 106 10/31/2022   CO2 20 (L) 10/31/2022    Lab Results  Component Value Date   ALT 12 10/28/2022   AST 16 10/28/2022   ALKPHOS 112 10/28/2022   BILITOT 1.2 10/28/2022       Laurice Record, [facility] 10/31/2022, 3:30 PM

## 2022-10-31 NOTE — Progress Notes (Addendum)
Referring Physician(s): Berle Mull  Supervising Physician: Ruthann Cancer  Patient Status:  [facility] - In-pt  Chief Complaint:  6 days s/p TOA left 9abscess drain placement   Subjective:  Pt resting in bed looking at phone. She reports nausea with no emesis. She endorses intermittent pain at site with tenderness.  Allergies: Metoclopramide, Codeine, Hydrocodone, Penicillins, and Percocet [oxycodone-acetaminophen]  Medications: Prior to Admission medications   Medication Sig Start Date End Date Taking? Authorizing Provider  ADVAIR HFA 230-21 MCG/ACT inhaler Inhale 2 puffs into the lungs 2 (two) times daily. 12/20/20   [provider]  albuterol (PROVENTIL) (2.5 MG/3ML) 0.083% nebulizer solution Take 2.5 mg by nebulization as needed for wheezing.    [provider]  ALPRAZolam Duanne Moron) 0.5 MG tablet Take 0.5 mg by mouth as needed for anxiety or sleep. 04/19/17   [provider]  apixaban (ELIQUIS) 2.5 MG TABS tablet Take 1 tablet (2.5 mg total) by mouth 2 (two) times daily. 09/21/22   Adrian Prows, MD  ARIPiprazole (ABILIFY) 5 MG tablet  01/14/21   [provider]  Azathioprine 75 MG TABS Take 150 mg by mouth 2 (two) times daily. 02/04/21   [provider]  B-D UF III MINI PEN NEEDLES 31G X 5 MM MISC USE AS DIRECTED TWICE A DAY 10/31/18   Renato Shin, MD  Belimumab (BENLYSTA) 200 MG/ML SOAJ Inject 200 mg into the skin once a week. Thursdays 12/19/19   [provider]  buPROPion (WELLBUTRIN XL) 300 MG 24 hr tablet Take 300 mg by mouth daily. 03/14/22   [provider]  carvedilol (COREG) 25 MG tablet Take 1.5 tablets (37.5 mg total) by mouth 2 (two) times daily with a meal. 01/01/21   Adrian Prows, MD  Continuous Blood Gluc Sensor (FREESTYLE LIBRE 3 SENSOR) MISC 1 Device by Does not apply route every 14 (fourteen) days. Apply 1 sensor on upper arm every 14 days for continuous glucose monitoring 05/25/22   Elayne Snare, MD  cromolyn  (OPTICROM) 4 % ophthalmic solution Place 1 drop into both eyes 2 (two) times daily. 01/14/21   [provider]  cycloSPORINE (RESTASIS) 0.05 % ophthalmic emulsion Place 1 drop into both eyes in the morning, at noon, and at bedtime.     [provider]  diclofenac Sodium (VOLTAREN) 1 % GEL APPLY 2 GRAMS TO AFFECTED AREA 4 TIMES A DAY Patient taking differently: Apply 2 g topically 4 (four) times daily as needed (pain). 01/11/22   Cantwell, Celeste C, PA-C  EASY TOUCH PEN NEEDLES 32G X 4 MM MISC USE TO INJECT INSULIN TWICE DAILY 08/07/19   Renato Shin, MD  famotidine (PEPCID) 20 MG tablet Take 20 mg by mouth 2 (two) times daily.    [provider]  fluticasone (FLONASE) 50 MCG/ACT nasal spray Place 2 sprays into both nostrils continuous as needed for allergies or rhinitis.    [provider]  glucose blood (ACCU-CHEK GUIDE) test strip 1 each by Other route 2 (two) times daily. And lancets 2/day 06/18/21   Renato Shin, MD  glucose blood [facility] ULTRA) test strip Test blood sugar 4 times a day 03/30/22   Elayne Snare, MD  hydroxychloroquine (PLAQUENIL) 200 MG tablet Take 200 mg by mouth 2 (two) times daily.    [provider]  ibuprofen (ADVIL) 600 MG tablet Take 1 tablet (600 mg total) by mouth every 6 (six) hours as needed. Patient taking differently: Take 600 mg by mouth every 6 (six) hours as needed  for mild pain. 10/14/22   Montine Circle, PA-C  insulin regular human CONCENTRATED (HUMULIN R U-500 KWIKPEN) 500 UNIT/ML KwikPen 100 UNITS, 30 MINUTES BEFORE EACH MEAL Patient taking differently: 100 Units 3 (three) times daily with meals. 100 Units, 30 minutes before each meal 08/16/22   Elayne Snare, MD  Insulin Syringe-Needle U-100 (EASY TOUCH INSULIN SYRINGE) 31G X 5/16" 1 ML MISC USE AS DIRECTED THREE TIMES A DAY 10/12/18   Renato Shin, MD  JARDIANCE 10 MG TABS tablet TAKE 1 TABLET (10 MG TOTAL) BY MOUTH DAILY WITH BREAKFAST. 08/18/22   Elayne Snare, MD   Lancets [facility] ULTRASOFT) lancets Test blood sugar 4 times a day 03/30/22   Elayne Snare, MD  levothyroxine (SYNTHROID) 50 MCG tablet Take 1 tablet (50 mcg total) by mouth daily at 6 (six) AM. 08/30/19   Raiford Noble Latif, DO  megestrol (MEGACE) 40 MG tablet Take 40 mg by mouth daily. 01/30/15   [provider]  metFORMIN (GLUCOPHAGE-XR) 500 MG 24 hr tablet Take 2 tablets (1,000 mg total) by mouth 2 (two) times daily. 02/02/21   Renato Shin, MD  Multiple Vitamin (MULTIVITAMIN WITH MINERALS) TABS tablet Take 1 tablet by mouth daily. 08/30/19   Raiford Noble Latif, DO  ondansetron (ZOFRAN-ODT) 4 MG disintegrating tablet Take 1 tablet (4 mg total) by mouth every 8 (eight) hours as needed for nausea or vomiting. 10/14/22   Montine Circle, PA-C  oxybutynin (DITROPAN-XL) 5 MG 24 hr tablet Take 5 mg by mouth daily. 04/25/22   [provider]  pregabalin (LYRICA) 75 MG capsule Take 75 mg 3 (three) times daily by mouth.  01/28/15   [provider]  prochlorperazine (COMPAZINE) 10 MG tablet Take 1 tablet (10 mg total) by mouth every 6 (six) hours as needed for nausea or vomiting. 05/14/22   Bonnielee Haff, MD  promethazine (PHENERGAN) 25 MG suppository Place 1 suppository (25 mg total) rectally every 6 (six) hours as needed for nausea or vomiting. 10/21/22   Carlis Abbott, Meghan R, PA-C  QUEtiapine (SEROQUEL) 300 MG tablet Take 300 mg by mouth at bedtime. 01/08/21   [provider]  rosuvastatin (CRESTOR) 20 MG tablet Take 1 tablet (20 mg total) by mouth daily. 06/09/21 10/16/22  Cantwell, Celeste C, PA-C  Semaglutide, 2 MG/DOSE, 8 MG/3ML SOPN Inject 2 mg as directed once a week. 08/18/22   Elayne Snare, MD  sucralfate (CARAFATE) 1 GM/10ML suspension Take 10 mLs by mouth 2 (two) times daily as needed (abdomen pain). 12/09/20   [provider]  topiramate (TOPAMAX) 25 MG tablet Take 75 mg by mouth at bedtime. 03/17/16   [provider]  torsemide (DEMADEX) 20 MG tablet  Take 1 tablet (20 mg total) by mouth at bedtime. 06/09/21   Cantwell, Celeste C, PA-C  TRANSDERM-SCOP 1 MG/3DAYS Place 1 patch (1.5 mg total) onto the skin every 3 (three) days. As needed for nausea 10/21/22   Theressa Stamps R, PA-C  Vitamin D, Ergocalciferol, (DRISDOL) 1.25 MG (50000 UNIT) CAPS capsule Take 50,000 Units by mouth once a week. 09/20/22   [provider]     Vital Signs: BP 132/83 (BP Location: Right Arm)   Pulse 87   Temp 98.6 F (37 C) (Oral)   Resp 20   Ht 5\' 8"  (1.727 m)   Wt (!) 361 lb 8.9 oz (164 kg)   LMP 03/24/2017   SpO2 99%   BMI 54.97 kg/m   Physical Exam Vitals reviewed.  Constitutional:      General:  She is not in acute distress.    Appearance: She is not ill-appearing.  HENT:     Head: Normocephalic and atraumatic.  Pulmonary:     Effort: Pulmonary effort is normal. No respiratory distress.  Abdominal:     Comments: Drain flushes/aspirates easily. Site unremarkable with sutures/statlock in place. Scant OP in gravity bag. Dressing C/D/I.    Skin:    General: Skin is warm and dry.  Neurological:     Mental Status: She is alert and oriented to person, place, and time.  Psychiatric:        Mood and Affect: Mood normal.        Behavior: Behavior normal.        Thought Content: Thought content normal.        Judgment: Judgment normal.     Imaging: DG Abd Portable 1V  Result Date: 10/29/2022 CLINICAL DATA:  [id number] Ileus [id number] EXAM: PORTABLE ABDOMEN - 1 VIEW COMPARISON:  10/28/2022 FINDINGS: Stomach is nondistended. Multiple gas dilated small bowel loops in the mid abdomen, relatively stable in number and degree of dilatation. There is gaseous distention of the transverse colon. Rectum decompressed. Left pelvic pigtail catheter remains in place. IMPRESSION: Persistent small bowel dilatation suggesting ileus or partial small bowel obstruction. Electronically Signed   By: Lucrezia Europe M.D.   On: 10/29/2022 11:24   DG Abd Portable 1V  Result Date:  10/28/2022 CLINICAL DATA:  Ileus EXAM: PORTABLE ABDOMEN - 1 VIEW COMPARISON:  CT 10/23/2022.  X-ray 02/17/2021 FINDINGS: there is a drain overlying the central pelvis. Pigtail catheter. Several mildly dilated air-filled loops of small bowel and large bowel are noted. There is differential. Favor ileus. Recommend continued follow-up. No obvious free air. Images are limited due to motion and underpenetration. IMPRESSION: Multiple dilated air-filled loops of small and large bowel. Small bowel measuring up to 4.9 cm. There is a differential including developing obstruction but this very well could represent an ileus recommend continued follow-up. Pelvic drain. Limited x-rays Electronically Signed   By: Jill Side M.D.   On: 10/28/2022 11:36    Labs:  CBC: Recent Labs    10/28/22 0728 10/29/22 1037 10/30/22 1127 10/31/22 0426  WBC 13.1* 22.2* 23.3* 22.7*  HGB 9.0* 9.7* 8.7* 8.4*  HCT 25.6* 27.6* 24.0* 24.2*  PLT 321 375 395 418*    COAGS: Recent Labs    10/23/22 2110  INR 1.2  APTT 26    BMP: Recent Labs    10/28/22 0728 10/29/22 1037 10/30/22 1127 10/31/22 0426  NA 136 137 136 132*  K 3.4* 3.7 5.1 3.5  CL 109 108 107 106  CO2 18* 19* 19* 20*  GLUCOSE 102* 107* 152* 162*  BUN 16 12 9 10   CALCIUM 8.4* 8.5* 8.5* 8.0*  CREATININE 0.93 0.83 0.76 0.76  GFRNONAA >60 >60 >60 >60    LIVER FUNCTION TESTS: Recent Labs    10/23/22 2110 10/26/22 0406 10/27/22 0724 10/28/22 0728  BILITOT 0.9 1.3* 1.2 1.2  AST 13* 17 15 16   ALT 10 14 13 12   ALKPHOS 111 105 124 112  PROT 6.9 5.8* 6.0* 5.4*  ALBUMIN 2.9* 1.9* 2.0* 1.7*    Assessment and Plan:  LLQ drain placed 10/25/22 with Dr. Vernard Gambles for TOA WBC 22.7 (23.3) Afebrile, VSS  Output by Drain (mL) 10/29/22 0701 - 10/29/22 1900 10/29/22 1901 - 10/30/22 0700 10/30/22 0701 - 10/30/22 1900 10/30/22 1901 - 10/31/22 0700 10/31/22 0701 - 10/31/22 1409  Closed System Drain 1 Left LLQ  Other (Comment) 12 Fr. 12 5 0  5.5    Plan: Continue TID flushes with 5 cc NS. Record output Q shift. Dressing changes QD or PRN if soiled.  Call IR APP or on call IR MD if difficulty flushing or sudden change in drain output.  Repeat imaging/possible drain injection once output < 10 mL/QD (excluding flush material). Consideration for drain removal if output is < 10 mL/QD (excluding flush material), pending discussion with the providing surgical service.   Discharge planning: Please contact IR APP or on call IR MD prior to patient d/c to ensure appropriate follow up plans are in place.    IR will continue to follow - please call with questions or concerns.    Electronically Signed: Tyson Alias, NP 10/31/2022, 2:02 PM   I spent a total of 15 Minutes at the the patient's bedside AND on the patient's hospital floor or unit, greater than 50% of which was counseling/coordinating care for TOA.

## 2022-10-31 NOTE — Progress Notes (Signed)
[facility] Progress Note Patient: [name] [id number] DOB: 1971/02/04 DOA: 10/23/2022  DOS: the patient was seen and examined on 10/31/2022  Brief hospital course: 52 year old African-American female with past medical history of morbid obesity with BMI greater than 50, systolic/diastolic CHF with previous ejection fraction of 30 to 35% and grade 1 diastolic dysfunction, chronic right ventricular failure, history of DVT, hypothyroidism, depression and anxiety and chronic abdominal pain who presented to the emergency room on 3/24 with complaints of lightheadedness and abdominal pain.  She was also found to be somewhat confused.  Workup revealed severe sepsis secondary to intra-abdominal abscess concerning for PID/TOA and case discussed with gynecology.  Mass not present on CT on 3/15.  Interventional radiology consulted and patient underwent drain placement 3/26.  Mentation much improved.  Patient had been on a limited diet full liquids due to complaints of abdominal pain.  Started having some nausea and vomiting 3/29.  Abdominal x-ray noted dilated small bowel loops concerning for developing ileus.  Symptoms have persisted and repeat x-ray on 3/30 confirms SBO versus ileus.  Assessment and Plan: Syncope. Acute metabolic encephalopathy/hypersomnolence-resolved Initially presented with what appears to be a syncopal event. There was a concern for seizures, however EEG was unremarkable.  Ammonia level, TSH, HIV, RPR all unremarkable.  VBG negative for hypercarbia.  Neurology evaluated and felt findings unrelated to any neurological issue.  Initial concern this could be secondary to pain medication.  Patient's IV pain medicine was held and she became more appropriate and alert.  Felt to be multifactorial secondary to decreased clearance from acute kidney injury, additional anesthesia and pain medication from intervention radiology for drain placement and IV pain medicine.  By 3/28, patient much  improved today despite getting continued pain medication is alert and oriented x 3  Severe sepsis secondary to intra-abdominal abscess in patient with acute on chronic abdominal pain.-Resolved Patient met criteria for severe sepsis given fever, leukocytosis, AKI creatinine greater than 2 and abdominal source.  CT abdomen shows evidence of a 5 x 5 cm fluid collection in the left hemipelvis concerning for developing abscess.  GYN was consulted.  Discussed with Dr. Roselie Awkward. She has not had sex in 4 years and the mass was not present on CT 3/15.  GYN does not think that the patient is suffering from a tubo-ovarian abscess.  Patient underwent IR guided drain placement on 3/26.  Following drain placement and antibiotic adjustment, white blood cell count and procalcitonin initially improved and started to trend back upward.  Now with minimal output.  Will discuss with interventional radiology about drain removal  UTI: Patient's white blood cell count initially improved following antibiotics and drain placement, down to 13 on 3/29.  However, since then has been steadily rising, currently at 84.  Urine not very impressive.  Negative cultures.  Discussed with infectious disease and they feel white count more likely related to SBO/ileus.  White blood cell count and procalcitonin finally starting to level of  SBO versus ileus Persisting.  Treating underlying infection and electrolyte abnormalities.  Ice chips.  Will have PT see to increase ambulation  AKI-resolved. Suspect ATN in the setting of sepsis, possibility of contrast-induced injury as well as prerenal etiology in the setting of poor p.o. intake.  Improving with IV fluids. Initiate with IV fluid.  Monitor closely due to history of CHF.  Much improved by 3/28  Hypersomnolence. OHS. Obesity class III. Body mass index is 54.97 kg/m.  Placing the patient at high risk of poor  outcome. Patient was recommended to have an outpatient sleep evaluation during  her recent hospitalization.  Chronic combined systolic and diastolic CHF. Echocardiogram done during this hospitalization notes much improvement in ejection fraction and indeterminate diastolic function.  BNP within normal limits  Gastroparesis. Patient is not on any medication for gastroparesis.  Tolerating p.o. Still reporting some nausea and vomiting.  Intolerant of Reglan.  Abdominal x-ray notes multiple dilated air-filled loops.  Will replace electrolytes accordingly. Currently on Zofran. Will resume home scopolamine patch.  History of PE. Had a PE last October, told that she needs to be on Eliquis indefinitely.  Type 2 diabetes mellitus, uncontrolled with hyperglycemia with gastroparesis. With long-term insulin use. Patient is actually on U-500 insulin at home. Now with SBO/ileus, decreased long-acting insulin down to 10 units only plus Q for her sliding scale.  Lupus and Sjogren's syndrome. Patient is on hydroxychloroquine, belimumab as well as Imuran. Patient remains at risk for infections. Monitor for now.  Anxiety and depression. Continue home regimen.  Hypothyroidism. Continue Synthroid.  Overactive bladder. On Ditropan, currently held  GERD. Continue PPI.  Diabetic neuropathy. On Lyrica. Due to encephalopathy we will hold it for now.  Restart slowly  Hyperlipidemia. Continue Crestor.  Oral candidiasis. Will treat with Diflucan.  Subjective: Less abdominal pain/nausea/vomiting, in part because she is not eating anything  Physical Exam: General: Oriented x 3, no acute distress Cardiovascular: Regular rate and rhythm, S1-S2 Lungs: Decreased breath sounds throughout secondary to body habitus Abdomen: Soft, obese, mild generalized nonspecific tenderness, scant bowel sounds Extremities: No clubbing or cyanosis, trace pitting edema  Data Reviewed: White blood cell count with no change from previous day, procalcitonin trending  downward  Disposition: Status is: Inpatient Remains inpatient appropriate because: Improvement in infection Resolution of SBO/ileus Removal of drain  apixaban (ELIQUIS) tablet 2.5 mg Start: 10/25/22 2200 Place and maintain sequential compression device Start: 10/25/22 1103 apixaban (ELIQUIS) tablet 2.5 mg   Family Communication: Updated sister by phone Level of care: Telemetry Medical   Vitals:   10/30/22 2353 10/31/22 0500 10/31/22 0718 10/31/22 1113  BP: (!) 117/57 122/63 135/79 132/83  Pulse: 95 96 93 87  Resp: 19 20 17 20   Temp: 98.7 F (37.1 C) 98.5 F (36.9 C) 98.3 F (36.8 C) 98.6 F (37 C)  TempSrc: Oral Oral Oral Oral  SpO2: 100% 99% 98% 99%  Weight:  (!) 164 kg    Height:         Author: Annita Brod, MD 10/31/2022 1:31 PM  Please look on [email or web address] to find out who is on call.

## 2022-10-31 NOTE — Plan of Care (Signed)
  Problem: Education: Goal: Ability to describe self-care measures that may prevent or decrease complications (Diabetes Survival Skills Education) will improve Outcome: Progressing Goal: Individualized Educational Video(s) Outcome: Progressing   Problem: Fluid Volume: Goal: Ability to maintain a balanced intake and output will improve Outcome: Progressing   Problem: Health Behavior/Discharge Planning: Goal: Ability to identify and utilize available resources and services will improve Outcome: Progressing Goal: Ability to manage health-related needs will improve Outcome: Progressing   Problem: Metabolic: Goal: Ability to maintain appropriate glucose levels will improve Outcome: Progressing   Problem: Nutritional: Goal: Progress toward achieving an optimal weight will improve Outcome: Progressing   Problem: Skin Integrity: Goal: Risk for impaired skin integrity will decrease Outcome: Progressing   Problem: Tissue Perfusion: Goal: Adequacy of tissue perfusion will improve Outcome: Progressing   Problem: Education: Goal: Knowledge of General Education information will improve Description: Including pain rating scale, medication(s)/side effects and non-pharmacologic comfort measures Outcome: Progressing   Problem: Health Behavior/Discharge Planning: Goal: Ability to manage health-related needs will improve Outcome: Progressing   Problem: Clinical Measurements: Goal: Ability to maintain clinical measurements within normal limits will improve Outcome: Progressing Goal: Diagnostic test results will improve Outcome: Progressing Goal: Respiratory complications will improve Outcome: Progressing Goal: Cardiovascular complication will be avoided Outcome: Progressing   Problem: Coping: Goal: Level of anxiety will decrease Outcome: Progressing   Problem: Elimination: Goal: Will not experience complications related to urinary retention Outcome: Progressing   Problem: Pain  Managment: Goal: General experience of comfort will improve Outcome: Progressing   Problem: Safety: Goal: Ability to remain free from injury will improve Outcome: Progressing   Problem: Skin Integrity: Goal: Risk for impaired skin integrity will decrease Outcome: Progressing

## 2022-11-01 ENCOUNTER — Other Ambulatory Visit: Payer: Self-pay

## 2022-11-01 ENCOUNTER — Inpatient Hospital Stay (HOSPITAL_COMMUNITY): Payer: Medicaid Other

## 2022-11-01 DIAGNOSIS — G9340 Encephalopathy, unspecified: Secondary | ICD-10-CM | POA: Diagnosis not present

## 2022-11-01 DIAGNOSIS — I5022 Chronic systolic (congestive) heart failure: Secondary | ICD-10-CM | POA: Diagnosis not present

## 2022-11-01 DIAGNOSIS — N739 Female pelvic inflammatory disease, unspecified: Secondary | ICD-10-CM

## 2022-11-01 DIAGNOSIS — K567 Ileus, unspecified: Secondary | ICD-10-CM | POA: Diagnosis not present

## 2022-11-01 LAB — BASIC METABOLIC PANEL
Anion gap: 8 (ref 5–15)
BUN: 7 mg/dL (ref 6–20)
CO2: 23 mmol/L (ref 22–32)
Calcium: 8.4 mg/dL — ABNORMAL LOW (ref 8.9–10.3)
Chloride: 106 mmol/L (ref 98–111)
Creatinine, Ser: 0.8 mg/dL (ref 0.44–1.00)
GFR, Estimated: >60 mL/min (ref >60)
Glucose, Bld: 130 mg/dL — ABNORMAL HIGH (ref 70–99)
Potassium: 3.6 mmol/L (ref 3.5–5.1)
Sodium: 137 mmol/L (ref 135–145)

## 2022-11-01 LAB — CBC WITH DIFFERENTIAL/PLATELET
Abs Immature Granulocytes: 0.65 10*3/uL — ABNORMAL HIGH (ref 0.00–0.07)
Basophils Absolute: 0.1 10*3/uL (ref 0.0–0.1)
Basophils Relative: 0 %
Eosinophils Absolute: 0.6 10*3/uL — ABNORMAL HIGH (ref 0.0–0.5)
Eosinophils Relative: 3 %
HCT: 23.3 % — ABNORMAL LOW (ref 36.0–46.0)
Hemoglobin: 8 g/dL — ABNORMAL LOW (ref 12.0–15.0)
Immature Granulocytes: 3 %
Lymphocytes Relative: 14 %
Lymphs Abs: 3.1 10*3/uL (ref 0.7–4.0)
MCH: 29.2 pg (ref 26.0–34.0)
MCHC: 34.3 g/dL (ref 30.0–36.0)
MCV: 85 fL (ref 80.0–100.0)
Monocytes Absolute: 2.1 10*3/uL — ABNORMAL HIGH (ref 0.1–1.0)
Monocytes Relative: 10 %
Neutro Abs: 15.7 10*3/uL — ABNORMAL HIGH (ref 1.7–7.7)
Neutrophils Relative %: 70 %
Platelets: 456 10*3/uL — ABNORMAL HIGH (ref 150–400)
RBC: 2.74 MIL/uL — ABNORMAL LOW (ref 3.87–5.11)
RDW: 13.5 % (ref 11.5–15.5)
Smear Review: NORMAL
WBC: 22.3 10*3/uL — ABNORMAL HIGH (ref 4.0–10.5)
nRBC: 0.2 % (ref 0.0–0.2)

## 2022-11-01 LAB — GLUCOSE, CAPILLARY
Glucose-Capillary: 152 mg/dL — ABNORMAL HIGH (ref 70–99)
Glucose-Capillary: 131 mg/dL — ABNORMAL HIGH (ref 70–99)
Glucose-Capillary: 137 mg/dL — ABNORMAL HIGH (ref 70–99)
Glucose-Capillary: 138 mg/dL — ABNORMAL HIGH (ref 70–99)
Glucose-Capillary: 131 mg/dL — ABNORMAL HIGH (ref 70–99)
Glucose-Capillary: 111 mg/dL — ABNORMAL HIGH (ref 70–99)

## 2022-11-01 LAB — CBC
HCT: 22.9 % — ABNORMAL LOW (ref 36.0–46.0)
Hemoglobin: 7.9 g/dL — ABNORMAL LOW (ref 12.0–15.0)
MCH: 29.2 pg (ref 26.0–34.0)
MCHC: 34.5 g/dL (ref 30.0–36.0)
MCV: 84.5 fL (ref 80.0–100.0)
Platelets: 442 10*3/uL — ABNORMAL HIGH (ref 150–400)
RBC: 2.71 MIL/uL — ABNORMAL LOW (ref 3.87–5.11)
RDW: 13.7 % (ref 11.5–15.5)
WBC: 22.2 10*3/uL — ABNORMAL HIGH (ref 4.0–10.5)
nRBC: 0.2 % (ref 0.0–0.2)

## 2022-11-01 LAB — PROCALCITONIN: Procalcitonin: 0.32 ng/mL

## 2022-11-01 LAB — MAGNESIUM: Magnesium: 1.7 mg/dL (ref 1.7–2.4)

## 2022-11-01 MED ORDER — IOHEXOL 350 MG/ML SOLN
100.0000 mL | Freq: Once | INTRAVENOUS | Status: AC | PRN
  Administered 2022-11-01: 100 mL via INTRAVENOUS

## 2022-11-01 NOTE — Progress Notes (Signed)
[facility] Progress Note Patient: [name] [id number] DOB: 09-Jul-1971 DOA: 10/23/2022  DOS: the patient was seen and examined on 11/01/2022  Brief hospital course: 52 year old African-American female with past medical history of morbid obesity with BMI greater than 50, systolic/diastolic CHF with previous ejection fraction of 30 to 35% and grade 1 diastolic dysfunction, chronic right ventricular failure, history of DVT, hypothyroidism, depression and anxiety and chronic abdominal pain who presented to the emergency room on 3/24 with complaints of lightheadedness and abdominal pain.  She was also found to be somewhat confused.  Workup revealed severe sepsis secondary to intra-abdominal abscess concerning for PID/TOA and case discussed with gynecology.  Mass not present on CT on 3/15.  Interventional radiology consulted and patient underwent drain placement 3/26.  Mentation much improved.  Patient had been on a limited diet full liquids due to complaints of abdominal pain.  Started having some nausea and vomiting 3/29.  Abdominal x-ray noted dilated small bowel loops concerning for developing ileus.  Symptoms have persisted and repeat x-ray on 3/30 confirms SBO versus ileus.  Patient has had a prolonged period without eating much, and with persistent ileus, started on PICC line and TPN 4/2.  Assessment and Plan: Syncope. Acute metabolic encephalopathy/hypersomnolence-resolved Initially presented with what appears to be a syncopal event. There was a concern for seizures, however EEG was unremarkable.  Ammonia level, TSH, HIV, RPR all unremarkable.  VBG negative for hypercarbia.  Neurology evaluated and felt findings unrelated to any neurological issue.  Initial concern this could be secondary to pain medication.  Patient's IV pain medicine was held and she became more appropriate and alert.  Felt to be multifactorial secondary to decreased clearance from acute kidney injury, additional  anesthesia and pain medication from intervention radiology for drain placement and IV pain medicine.  By 3/28, patient much improved today despite getting continued pain medication is alert and oriented x 3  Severe sepsis secondary to intra-abdominal abscess in patient with acute on chronic abdominal pain.-Resolved Patient met criteria for severe sepsis given fever, leukocytosis, AKI creatinine greater than 2 and abdominal source.  CT abdomen shows evidence of a 5 x 5 cm fluid collection in the left hemipelvis concerning for developing abscess.  GYN was consulted.  Discussed with Dr. Roselie Awkward. She has not had sex in 4 years and the mass was not present on CT 3/15.  GYN does not think that the patient is suffering from a tubo-ovarian abscess.  Patient underwent IR guided drain placement on 3/26.  Following drain placement and antibiotic adjustment, white blood cell count and procalcitonin initially improved and started to trend back upward.  Now with minimal output.  Intervention radiology following.  Will recheck abdominal CT as per further recommendation to follow-up on abscess.  If much improved, drain removed.  UTI: Patient's white blood cell count initially improved following antibiotics and drain placement, down to 13 on 3/29.  However, since then has been steadily rising, currently at 16.  Urine not very impressive.  Negative cultures.  Discussed with infectious disease and they feel white count more likely related to SBO/ileus.  White blood cell count has leveled off, but procalcitonin continues to improve.  Persistent ileus Persisting.  Treating underlying infection and electrolyte abnormalities.  Ice chips.  Will have PT see to increase ambulation.  Have added TPN due to prolonged period without nutrition.  Rechecking abdominal CT for follow-up on abscess, can also evaluate ileus.  AKI-resolved. Suspect ATN in the setting of sepsis, possibility of  contrast-induced injury as well as prerenal  etiology in the setting of poor p.o. intake.  Improving with IV fluids. Initiate with IV fluid.  Monitor closely due to history of CHF.  Much improved by 3/28  Hypersomnolence. OHS. Obesity class III. Body mass index is 55.44 kg/m.  Placing the patient at high risk of poor outcome. Patient was recommended to have an outpatient sleep evaluation during her recent hospitalization.  Chronic combined systolic and diastolic CHF. Echocardiogram done during this hospitalization notes much improvement in ejection fraction and indeterminate diastolic function.  BNP within normal limits  Gastroparesis. Patient is not on any medication for gastroparesis.  Tolerating p.o. Still reporting some nausea and vomiting.  Intolerant of Reglan.  Abdominal x-ray notes multiple dilated air-filled loops.  Will replace electrolytes accordingly. Currently on Zofran. Will resume home scopolamine patch.  Severe protein calorie malnutrition:Nutrition Status: Nutrition Problem: Inadequate oral intake Etiology: poor appetite, nausea Signs/Symptoms: meal completion < 25%, NPO status, per patient/family report Interventions: Refer to RD note for recommendations    History of PE. Had a PE last October, told that she needs to be on Eliquis indefinitely.  Type 2 diabetes mellitus, uncontrolled with hyperglycemia with gastroparesis. With long-term insulin use. Patient is actually on U-500 insulin at home. Now with SBO/ileus, decreased long-acting insulin down to 10 units only plus Q for her sliding scale.  Lupus and Sjogren's syndrome. Patient is on hydroxychloroquine, belimumab as well as Imuran. Patient remains at risk for infections. Monitor for now.  Anxiety and depression. Continue home regimen.  Hypothyroidism. Continue Synthroid.  Overactive bladder. On Ditropan, currently held  GERD. Continue PPI.  Diabetic neuropathy. On Lyrica. Due to encephalopathy we will hold it for now.  Restart  slowly  Hyperlipidemia. Continue Crestor.  Oral candidiasis. Will treat with Diflucan.  Subjective: Less abdominal pain/nausea/vomiting, in part because she is not eating anything  Physical Exam: General: Oriented x 3, no acute distress Cardiovascular: Regular rate and rhythm, S1-S2 Lungs: Decreased breath sounds throughout secondary to body habitus Abdomen: Soft, obese, mild generalized nonspecific tenderness, scant bowel sounds Extremities: No clubbing or cyanosis, trace pitting edema  Data Reviewed: White blood cell count with no change from previous day, procalcitonin trending downward  Disposition: Status is: Inpatient Remains inpatient appropriate because: Improvement in infection Resolution of SBO/ileus Removal of drain  apixaban (ELIQUIS) tablet 2.5 mg Start: 10/25/22 2200 Place and maintain sequential compression device Start: 10/25/22 1103 apixaban (ELIQUIS) tablet 2.5 mg   Family Communication: Updated sister by phone Level of care: Telemetry Medical   Vitals:   11/01/22 1117 11/01/22 1523 11/01/22 1630 11/01/22 1930  BP: (!) 159/75 135/67 128/69 (!) 144/76  Pulse: 90 86 87   Resp: 15 16 18    Temp: 98.9 F (37.2 C) 98.6 F (37 C)  98.4 F (36.9 C)  TempSrc: Oral Oral  Axillary  SpO2: 98% 98%    Weight:      Height:         Author: Annita Brod, MD 11/01/2022 7:50 PM  Please look on [email or web address] to find out who is on call.

## 2022-11-01 NOTE — Progress Notes (Addendum)
Mobility Specialist Progress Note   11/01/22 1641  Mobility  Activity Dangled on edge of bed  Level of Assistance Moderate assist, patient does 50-74%  Assistive Device Other (Comment) (HHA)  Range of Motion/Exercises All extremities  Activity Response Tolerated well  Mobility Referral Yes  $Mobility charge 1 Mobility   Pre Mobility: 81 HR, 140/75 BP During Mobility: 95 HR Post Mobility: 88 HR, 128/69 BP  Received pt in bed c/o LLQ pain in abdominals, expressing pain to be (20/10) but had recently received pain meds. After max encouragement pt agreeable to sit EOB. MinA for bed mobility, Pt benefits from UE support to pull into sitting. Once sitting, pt tolerating assisted trunk flexion and extension for 2x5ea. ModA to get back in bed d/t fatigue and general weakness. left w/ call bell in reach and bed alarm on. RN notified   Holland Falling Mobility Specialist Please contact via SecureChat or  Rehab office at [phone]

## 2022-11-01 NOTE — Progress Notes (Addendum)
[facility]  Date of Admission:  10/23/2022   Total days of inpatient antibiotics 8  Principal Problem:   Acute encephalopathy Active Problems:   SJOGREN'S SYNDROME   Hypertension complicating diabetes   Chronic systolic CHF (congestive heart failure)   QT prolongation   Type 2 diabetes mellitus with hyperlipidemia   History of CVA (cerebrovascular accident)   Major depressive disorder with single episode, in partial remission   SLE (systemic lupus erythematosus related syndrome)   PID (acute pelvic inflammatory disease)          Assessment: 52 year old female with pelvic abscess on immunosuppression   #Pelvic abscess status post CT-guided drainage with drain placement #Recurrent N/V #Leukocytosis 2/2 SBO vs pelvic abscess - 3/24 CT abdomen pelvis with contrast showed diffuse inflammatory changes and pelvic stranding, new since prior CT on 3/15.  Concern for PID with developing abscess(5x5cm), small amount of hemorrhagic content may be present pelvis possibility of hemorrhagic cyst that is ruptured.  Ultrasound recommended for further evaluation.  On Doxy, Merrem. - Ultrasound was technically difficult. - Patient underwent CT-guided drainage with IR on 3/26,  left tubo-ovarian fluid collection was localized, pigtail catheter placed with 60 mL fluid aspirated.  Cultures grew strep anginosus, Prevotella bivia. Patient has been transitioned to Unasyn - Xray on 3/30 showed small bowel dilatation suggesting SBO vs ileus - Of note: Patient reports she has not been sexually active since 2 years.  GC urine negative on 3/24 RPR nonreactive on 3/25, HIV negative on 3/25. Recommendations:  -Continue Unasyn, monitor clinically.   -She continues to have leukocytosis. Etiology is unclear but could be 2/2 SBO since pelvic abscess has a drain in place and abx on board. Will add diff to cbc -Hold Benlysta/Imuran   #Oral candidiasis - SP Diflucan x  5days  #History of recurrent nausea and vomiting since at least 2014, multiple hospitalizations --Of note patient has had multiple hospitalizations for nausea and vomiting, etiology seems to be unclear.  Looks like during 3/16 hospitalization patient had declined upper endoscopy.  She has had 3 EGDs previously, most recent was less than 2 years ago.  Suspicion for any significant pathology or malignancy was extremely low per GIs note on 3/19.     #Lupus(+ ANA, + SSA, SICCA symptoms, alopecia, oral ulcers, arthralgia) on immunosuppression -Followed by Dr. Ulla Gallo, MD at [facility], last seen on 10/10/2022.  Patient was on Imuran 150 mg p.o. twice daily, Plaquenil 200 twice daily, Benlysta subq weekly at that point.  Noted to be doing well at that time.   ADDENDUM: I spoke with Dr. Rogers Blocker, hold imuran till 2 weeks following discharge. Plan to follow with rheumatology in 6 to 8 weeks following discharge, at that point consider restarting Benlysta.    Microbiology:   Antibiotics: Fluconazole 3/26-p Doxy 3/24-p Augmentin 3/31-p Ceftriaxone 3/24-3/25 Metro 3/24-3/26 Merrem 3/26-3/31     Cultures: Blood 3/26 NG   Other 3/26 strep anginosus and prevotella bivia   Urine  3/31 <10k colonies   SUBJECTIVE: Resting in bd no new complaints.  Interval: Afebrile overnight. Wbc 22.2K  Review of Systems: ROS   Scheduled Meds:  apixaban  2.5 mg Oral BID   carvedilol  3.125 mg Oral BID WC   feeding supplement  237 mL Oral BID BM   insulin aspart  0-9 Units Subcutaneous Q4H   insulin glargine-yfgn  10 Units Subcutaneous Daily   levothyroxine  50 mcg Oral Q0600  pantoprazole  40 mg Oral Daily   QUEtiapine  200 mg Oral QHS   sodium chloride flush  3 mL Intravenous Q12H   sodium chloride flush  5 mL Intracatheter Q8H   Continuous Infusions:  ampicillin-sulbactam (UNASYN) IV 3 g (11/01/22 0901)   dextrose 5 % and 0.9% NaCl 75 mL/hr (11/01/22 0526)   PRN Meds:.acetaminophen  **OR** acetaminophen, HYDROmorphone (DILAUDID) injection, hydrOXYzine, ondansetron (ZOFRAN) IV, senna-docusate Allergies  Allergen Reactions   Metoclopramide Other (See Comments)    Developed restless leg, akathisia type limb movements.    Codeine Itching and Rash   Hydrocodone Rash   Percocet [Oxycodone-Acetaminophen] Hives and Rash    Tolerates dilaudid Tolerates acetaminophen     OBJECTIVE: Vitals:   11/01/22 0400 11/01/22 0500 11/01/22 0600 11/01/22 0710  BP:    130/82  Pulse:    91  Resp: 15 19  19   Temp:    98.9 F (37.2 C)  TempSrc:    Oral  SpO2:    98%  Weight:   (!) 165.4 kg   Height:       Body mass index is 55.44 kg/m.  Physical Exam Constitutional:      Appearance: Normal appearance.  HENT:     Head: Normocephalic and atraumatic.     Right Ear: Tympanic membrane normal.     Left Ear: Tympanic membrane normal.     Nose: Nose normal.     Mouth/Throat:     Mouth: Mucous membranes are moist.  Eyes:     Extraocular Movements: Extraocular movements intact.     Conjunctiva/sclera: Conjunctivae normal.     Pupils: Pupils are equal, round, and reactive to light.  Cardiovascular:     Rate and Rhythm: Normal rate and regular rhythm.     Heart sounds: No murmur heard.    No friction rub. No gallop.  Pulmonary:     Effort: Pulmonary effort is normal.     Breath sounds: Normal breath sounds.  Abdominal:     General: Abdomen is flat.     Palpations: Abdomen is soft.  Musculoskeletal:        General: Normal range of motion.  Skin:    General: Skin is warm and dry.  Neurological:     General: No focal deficit present.     Mental Status: She is alert and oriented to person, place, and time.  Psychiatric:        Mood and Affect: Mood normal.       Lab Results Lab Results  Component Value Date   WBC 22.2 (H) 11/01/2022   HGB 7.9 (L) 11/01/2022   HCT 22.9 (L) 11/01/2022   MCV 84.5 11/01/2022   PLT 442 (H) 11/01/2022    Lab Results  Component Value  Date   CREATININE 0.80 11/01/2022   BUN 7 11/01/2022   NA 137 11/01/2022   K 3.6 11/01/2022   CL 106 11/01/2022   CO2 23 11/01/2022    Lab Results  Component Value Date   ALT 12 10/28/2022   AST 16 10/28/2022   ALKPHOS 112 10/28/2022   BILITOT 1.2 10/28/2022        Laurice Record, [place] [facility] 11/01/2022, 10:46 AM

## 2022-11-01 NOTE — Progress Notes (Signed)
Initial Nutrition Assessment  DOCUMENTATION CODES:   Morbid obesity  INTERVENTION:  Highly recommend placement of PICC line and initiation of TPN  If TPN initiated, pt is at risk for refeeding syndrome given her prolonged poor PO intake. Recommend 100mg  of thiamine x 5 days and to monitor Mg and phosphorus q12h x 4 occurences. Pt does report that she is willing to try boost breeze and prosource plus, but suspect PO intake will not be tolerated due to ileus. Pt is currently NPO.   Boost Breeze po TID, each supplement provides 250 kcal and 9 grams of protein Prosource Plus BID to provide 100 kcal and 15g of protein per packet   NUTRITION DIAGNOSIS:   Inadequate oral intake related to poor appetite, nausea as evidenced by meal completion < 25%, NPO status, per patient/family report.  GOAL:   Patient will meet greater than or equal to 90% of their needs  MONITOR:   I & O's, Labs, Weight trends, Diet advancement  REASON FOR ASSESSMENT:   Malnutrition Screening Tool    ASSESSMENT:   Pt with hx of Bell's Palsy, DM type 2, CHF, HTN, HLD, and GERD presented to ED with AMS. Recently admitted for nausea  and abdominal pain. Imaging showed a 5 x 5 cm fluid collection worrisome for a developing abscess.  3/26 - CT guided drain placed in IR  Per chart review, pt has not had a solid food diet this admission and intake of liquid diet is extremely poor. Diet cancelled after XR 3/30 showed an ileus. Repeat imaging this AM shows no improvement in ileus  Pt resting in bed at the time of assessment. Reports that she is still having nausea and pain this AM. Is scared to attempt much PO due to discomfort. Also reports that she does not like the ensure shakes. They are too thick and too much volume at the moment for her to consume.   Discussed recent nutrition hx with pt. Reports that her has been struggling with PO intake since March 17/18 prior to coming in at last admission. States that her  appetite has not returned to baseline since last admission. Noted that pt was evaluated by RD at last admission and it seems that pt does not have an excellent eating pattern/appetite at baseline.  Noted in PT note this AM that pt is showing signs of significant debility from her baseline. On exam, deficits are not able to be palpated due to excess adipose tissue and edema but suspect pt has muscle wasting which is contributing to her decline in functional status.   Discussed with MD. Highly recommend placement of PICC line and initiation of TPN as pt is unable to meet her needs PO and imaging shows continued bowel dysfunction. Pt has also had periods of hypoglycemia over the last few days due to her poor PO intake. If TPN initiated, pt is at risk for refeeding syndrome given her prolonged poor PO intake.  Pt does report that she is willing to try boost breeze and prosource plus, but suspect PO intake will not be tolerated due to ileus. Pt is currently NPO.    Average Meal Intake: 3/25-4/1: 0-5% intake x 3 recorded meals  Nutritionally Relevant Medications: Scheduled Meds:  apixaban  2.5 mg Oral BID   feeding supplement  237 mL Oral BID BM   insulin aspart  0-9 Units Subcutaneous Q4H   insulin glargine-yfgn  10 Units Subcutaneous Daily   levothyroxine  50 mcg Oral Q0600   pantoprazole  40 mg Oral Daily   Continuous Infusions:  ampicillin-sulbactam (UNASYN) IV 3 g (11/01/22 0901)   dextrose 5 % and 0.9% NaCl 75 mL/hr (11/01/22 0526)   PRN Meds: ondansetron, senna-docusate  Labs Reviewed  NUTRITION - FOCUSED PHYSICAL EXAM:  Flowsheet Row Most Recent Value  Orbital Region No depletion  Upper Arm Region No depletion  Thoracic and Lumbar Region No depletion  Buccal Region No depletion  Temple Region No depletion  Clavicle Bone Region No depletion  Clavicle and Acromion Bone Region No depletion  Scapular Bone Region No depletion  Dorsal Hand No depletion  Patellar Region No  depletion  Anterior Thigh Region No depletion  Posterior Calf Region No depletion  Edema (RD Assessment) Mild  Hair Reviewed  Eyes Reviewed  Mouth Reviewed  Skin Reviewed  Nails Reviewed       Diet Order:   Diet Order     None       EDUCATION NEEDS:   Education needs have been addressed  Skin:  Skin Assessment: Reviewed RN Assessment  Last BM:  3/28  Height:   Ht Readings from Last 1 Encounters:  10/25/22 5\' 8"  (1.727 m)    Weight:   Wt Readings from Last 1 Encounters:  11/01/22 (!) 165.4 kg    Ideal Body Weight:  63.6 kg  BMI:  Body mass index is 55.44 kg/m.  Estimated Nutritional Needs:   Kcal:  1900-2100 kcal/d  Protein:  100-120 g/d  Fluid:  2L/d    Ranell Patrick, RD, LDN Clinical Dietitian RD pager # available in [place]  After hours/weekend pager # available in [facility]

## 2022-11-01 NOTE — Progress Notes (Signed)
SLP Cancellation Note  Patient Details Name: MALEEYA SOULLIERE MRN: [id number] DOB: 09/19/1970   Cancelled treatment:       Reason Eval/Treat Not Completed: Medical issues which prohibited therapy;Other (comment) (NPO with ileus)   Sonia Baller, MA, CCC-SLP Speech Therapy

## 2022-11-01 NOTE — Evaluation (Signed)
Physical Therapy Evaluation Patient Details Name: [name] MRN: [id number] DOB: March 10, 1971 Today's Date: 11/01/2022  History of Present Illness  Pt is a 52 y.o. F who presents 10/23/2022 with acute metabolic encephalopathy and severe sepsis secondary to intra-abdominal abscess. S/p drain placement 3/26. 3/30 x-ray showing SBO vs ileus. Significant PMH:  morbid obesity with BMI greater than 50, systolic/diastolic CHF with previous ejection fraction of 30 to 35% and grade 1 diastolic dysfunction, chronic right ventricular failure, history of DVT, hypothyroidism, depression and anxiety and chronic abdominal pain.  Clinical Impression  PTA, pt lives alone and has aide assist 7 days/wk; she is a limited household ambulator at baseline. Unfortunately, likely due a combination of premorbid sedentary status and immobility while inpatient x 8 days, pt has had a significant change from her functional baseline. Pt presents with abdominal pain, generalized weakness, impaired standing balance, and decreased cardiopulmonary endurance. Pt requiring moderate assist for bed mobility and transfers to standing. Unable to ambulate. Slow to initiate and perform all transitions. Goal for next session is to transfer to recliner and sit up for one hour. Patient will benefit from continued inpatient follow up therapy, <3 hours/day at d/c.     Recommendations for follow up therapy are one component of a multi-disciplinary discharge planning process, led by the attending physician.  Recommendations may be updated based on patient status, additional functional criteria and insurance authorization.  Follow Up Recommendations Can patient physically be transported by private vehicle: No     Assistance Recommended at Discharge PRN  Patient can return home with the following  Two people to help with walking and/or transfers;Two people to help with bathing/dressing/bathroom    Equipment Recommendations None recommended by PT   Recommendations for Other Services       Functional Status Assessment Patient has had a recent decline in their functional status and demonstrates the ability to make significant improvements in function in a reasonable and predictable amount of time.     Precautions / Restrictions Precautions Precautions: Fall Restrictions Weight Bearing Restrictions: No      Mobility  Bed Mobility Overal bed mobility: Needs Assistance Bed Mobility: Supine to Sit, Sit to Supine     Supine to sit: Mod assist Sit to supine: Mod assist   General bed mobility comments: Multimodal cues for progression to sitting up on edge of bed, cues for use of bed rail, HHA to pull trunk up to sitting. Brought foot board in for pt to have BUE support. Assist for BLE's to elevate back into bed    Transfers Overall transfer level: Needs assistance Equipment used: Rolling walker (2 wheels) Transfers: Sit to/from Stand Sit to Stand: Mod assist           General transfer comment: Cues for rocking to gain momentum, hand placement, transition fo L hand to walker .Crouched posture    Ambulation/Gait               General Gait Details: unable  Stairs            Wheelchair Mobility    Modified Rankin (Stroke Patients Only)       Balance Overall balance assessment: Needs assistance Sitting-balance support: Feet supported Sitting balance-Leahy Scale: Fair Sitting balance - Comments: BUE support due to pain   Standing balance support: Bilateral upper extremity supported Standing balance-Leahy Scale: Poor Standing balance comment: reliant on BUE support  Pertinent Vitals/Pain Pain Assessment Pain Assessment: 0-10 Pain Score: 8  Pain Location: abdomen Pain Descriptors / Indicators: Discomfort, Grimacing, Guarding Pain Intervention(s): Limited activity within patient's tolerance, Monitored during session, Patient requesting pain meds-RN notified     Home Living Family/patient expects to be discharged to:: Private residence Living Arrangements: Alone Available Help at Discharge: Family;Available PRN/intermittently Type of Home: House Home Access: Stairs to enter Entrance Stairs-Rails: None;Right;Left (no rails initially, then bilateral rails towards the top) Entrance Stairs-Number of Steps: 7   Home Layout: One level Home Equipment: Conservation officer, nature (2 wheels);Rollator (4 wheels);Wheelchair - manual;BSC/3in1;Tub bench Additional Comments: Aide 7 days/wk, 2.5 hours/day    Prior Function Prior Level of Function : Needs assist             Mobility Comments: limited household ambulator ADLs Comments: aide assists with ADL's/IADL's     Hand Dominance        Extremity/Trunk Assessment   Upper Extremity Assessment Upper Extremity Assessment: Defer to OT evaluation    Lower Extremity Assessment Lower Extremity Assessment: Generalized weakness    Cervical / Trunk Assessment Cervical / Trunk Assessment: Other exceptions Cervical / Trunk Exceptions: increased body habitus  Communication   Communication: No difficulties  Cognition Arousal/Alertness: Awake/alert Behavior During Therapy: Flat affect Overall Cognitive Status: Within Functional Limits for tasks assessed                                 General Comments: Very flat        General Comments      Exercises     Assessment/Plan    PT Assessment Patient needs continued PT services  PT Problem List Decreased strength;Decreased activity tolerance;Decreased mobility;Decreased balance;Obesity;Pain       PT Treatment Interventions DME instruction;Gait training;Therapeutic activities;Functional mobility training;Therapeutic exercise;Balance training;Patient/family education    PT Goals (Current goals can be found in the Care Plan section)  Acute Rehab PT Goals Patient Stated Goal: less pain PT Goal Formulation: With patient Time For Goal  Achievement: 11/15/22 Potential to Achieve Goals: Fair    Frequency Min 3X/week     Co-evaluation               AM-PAC PT "6 Clicks" Mobility  Outcome Measure Help needed turning from your back to your side while in a flat bed without using bedrails?: A Lot Help needed moving from lying on your back to sitting on the side of a flat bed without using bedrails?: A Lot Help needed moving to and from a bed to a chair (including a wheelchair)?: A Lot Help needed standing up from a chair using your arms (e.g., wheelchair or bedside chair)?: A Lot Help needed to walk in hospital room?: Total Help needed climbing 3-5 steps with a railing? : Total 6 Click Score: 10    End of Session   Activity Tolerance: Patient limited by pain Patient left: in bed;with call bell/phone within reach;with nursing/sitter in room Nurse Communication: Mobility status PT Visit Diagnosis: Muscle weakness (generalized) (M62.81);Difficulty in walking, not elsewhere classified (R26.2);Pain Pain - part of body:  (abdomen)    Time: [id number] PT Time Calculation (min) (ACUTE ONLY): 42 min   Charges:   PT Evaluation $PT Eval Moderate Complexity: 1 Mod PT Treatments $Therapeutic Activity: 23-37 mins        Wyona Almas, PT, DPT Acute Rehabilitation Services Office [phone]   Deno Etienne 11/01/2022, 9:10 AM

## 2022-11-01 NOTE — Evaluation (Signed)
Occupational Therapy Evaluation Patient Details Name: [name] MRN: [id number] DOB: Apr 29, 1971 Today's Date: 11/01/2022   History of Present Illness Pt is a 52 y.o. F who presents 10/23/2022 with acute metabolic encephalopathy and severe sepsis secondary to intra-abdominal abscess. S/p drain placement 3/26. 3/30 x-ray showing SBO vs ileus. Significant PMH:  morbid obesity with BMI greater than 50, systolic/diastolic CHF with previous ejection fraction of 30 to 35% and grade 1 diastolic dysfunction, chronic right ventricular failure, history of DVT, hypothyroidism, depression and anxiety and chronic abdominal pain.   Clinical Impression   PTA patient reports living alone, limited mobility and has assist from aide and sister for ADLs/ IADLs. She does report being able to manage her toileting needs without assistance. She presents with abdominal pain, decreased activity tolerance, generalized weakness and slow processing.  Pt declines OOB, but attempts to transition into long sitting with max assist (limited by abdominal pain).  Requires setup to total assist for ADLs.  Cognitively presents with flat affect, slow processing; likely baseline (adie assists with med mgmt). Based on performance today, believe pt will best benefit from continued OT services acutely and after dc at inpatient setting <3 hours/day to decrease burden of care and progress towards PLOF.       Recommendations for follow up therapy are one component of a multi-disciplinary discharge planning process, led by the attending physician.  Recommendations may be updated based on patient status, additional functional criteria and insurance authorization.   Assistance Recommended at Discharge Frequent or constant Supervision/Assistance  Patient can return home with the following A lot of help with walking and/or transfers;A lot of help with bathing/dressing/bathroom;Assistance with cooking/housework;Assist for transportation;Help with  stairs or ramp for entrance;Direct supervision/assist for financial management;Direct supervision/assist for medications management    Functional Status Assessment  Patient has had a recent decline in their functional status and demonstrates the ability to make significant improvements in function in a reasonable and predictable amount of time.  Equipment Recommendations  Other (comment) (defer)    Recommendations for Other Services       Precautions / Restrictions Precautions Precautions: Fall Precaution Comments: LLQ drain Restrictions Weight Bearing Restrictions: No      Mobility Bed Mobility Overal bed mobility: Needs Assistance Bed Mobility: Supine to Sit           General bed mobility comments: pt able to pull herself up in bed using trendelenburg without assist, attempted to pull into long siting with max assist but limited by abdominal pain    Transfers                          Balance                                           ADL either performed or assessed with clinical judgement   ADL Overall ADL's : Needs assistance/impaired     Grooming: Set up;Bed level           Upper Body Dressing : Moderate assistance;Bed level   Lower Body Dressing: Total assistance;Bed level     Toilet Transfer Details (indicate cue type and reason): deferred pt declined                 Vision   Vision Assessment?: No apparent visual deficits     Perception  Praxis      Pertinent Vitals/Pain Pain Assessment Pain Assessment: Faces Faces Pain Scale: Hurts even more Pain Location: abdomen Pain Descriptors / Indicators: Discomfort, Grimacing, Guarding Pain Intervention(s): Limited activity within patient's tolerance, Monitored during session, Repositioned     Hand Dominance Right   Extremity/Trunk Assessment Upper Extremity Assessment Upper Extremity Assessment: Generalized weakness   Lower Extremity Assessment Lower  Extremity Assessment: Defer to PT evaluation   Cervical / Trunk Assessment Cervical / Trunk Assessment: Other exceptions Cervical / Trunk Exceptions: increased body habitus   Communication Communication Communication: No difficulties   Cognition Arousal/Alertness: Awake/alert Behavior During Therapy: Flat affect Overall Cognitive Status: Impaired/Different from baseline Area of Impairment: Problem solving                             Problem Solving: Slow processing, Requires verbal cues, Decreased initiation General Comments: Patient oriented and following commands with increased time.  slow processing and very flat affect.  She enjoys talking about her grand nieces/nephews     General Comments  bed level session, pt declined OOB.    Exercises     Shoulder Instructions      Home Living Family/patient expects to be discharged to:: Private residence Living Arrangements: Alone Available Help at Discharge: Family;Available PRN/intermittently;Personal care attendant Type of Home: House Home Access: Stairs to enter [employer] of Steps: 7 Entrance Stairs-Rails: None;Right;Left Home Layout: One level     Bathroom Shower/Tub: Teacher, early years/pre: Standard     Home Equipment: Conservation officer, nature (2 wheels);Rollator (4 wheels);Wheelchair - manual;BSC/3in1;Tub bench   Additional Comments: Aide 7 days/wk, 2.5 hours/day      Prior Functioning/Environment Prior Level of Function : Needs assist             Mobility Comments: limited household ambulator ADLs Comments: aide assists with ADL's/IADL's, able to manage toileting without assist but aide assists with most other ADLs.  Her sister manages meals and assists as needed when aide is not present.  Aide sets up pill box weekly.        OT Problem List: Decreased strength;Decreased activity tolerance;Impaired balance (sitting and/or standing);Pain;Obesity;Decreased knowledge of use of DME  or AE;Decreased cognition      OT Treatment/Interventions: Self-care/ADL training;Balance training;Patient/family education;Cognitive remediation/compensation;Therapeutic activities;DME and/or AE instruction;Therapeutic exercise;Energy conservation    OT Goals(Current goals can be found in the care plan section) Acute Rehab OT Goals Patient Stated Goal: get better, back to what I was doing OT Goal Formulation: With patient Time For Goal Achievement: 11/15/22 Potential to Achieve Goals: Fair  OT Frequency: Min 2X/week    Co-evaluation              AM-PAC OT "6 Clicks" Daily Activity     Outcome Measure Help from another person eating meals?: A Little Help from another person taking care of personal grooming?: A Little Help from another person toileting, which includes using toliet, bedpan, or urinal?: Total Help from another person bathing (including washing, rinsing, drying)?: A Lot Help from another person to put on and taking off regular upper body clothing?: A Lot Help from another person to put on and taking off regular lower body clothing?: Total 6 Click Score: 12   End of Session Nurse Communication: Mobility status  Activity Tolerance: Patient limited by pain Patient left: in bed;with call bell/phone within reach;with bed alarm set  OT Visit Diagnosis: Other abnormalities of gait and mobility (R26.89);Pain;Muscle weakness (generalized) (  M62.81) Pain - part of body:  (abd)                Time: [id number] OT Time Calculation (min): 22 min Charges:  OT General Charges $OT Visit: 1 Visit OT Evaluation $OT Eval Moderate Complexity: 1 Mod  Jolaine Artist, OT Acute Rehabilitation Services Office [place] 11/01/2022, 1:34 PM

## 2022-11-02 ENCOUNTER — Encounter (HOSPITAL_COMMUNITY): Payer: Self-pay | Admitting: Internal Medicine

## 2022-11-02 DIAGNOSIS — Z8673 Personal history of transient ischemic attack (TIA), and cerebral infarction without residual deficits: Secondary | ICD-10-CM | POA: Diagnosis not present

## 2022-11-02 DIAGNOSIS — E1159 Type 2 diabetes mellitus with other circulatory complications: Secondary | ICD-10-CM

## 2022-11-02 DIAGNOSIS — I152 Hypertension secondary to endocrine disorders: Secondary | ICD-10-CM

## 2022-11-02 DIAGNOSIS — M3500 Sicca syndrome, unspecified: Secondary | ICD-10-CM

## 2022-11-02 DIAGNOSIS — I5022 Chronic systolic (congestive) heart failure: Secondary | ICD-10-CM | POA: Diagnosis not present

## 2022-11-02 DIAGNOSIS — G9340 Encephalopathy, unspecified: Secondary | ICD-10-CM | POA: Diagnosis not present

## 2022-11-02 LAB — COMPREHENSIVE METABOLIC PANEL
ALT: 14 U/L (ref 0–44)
AST: 12 U/L — ABNORMAL LOW (ref 15–41)
Albumin: 1.7 g/dL — ABNORMAL LOW (ref 3.5–5.0)
Alkaline Phosphatase: 111 U/L (ref 38–126)
Anion gap: 10 (ref 5–15)
BUN: <5 mg/dL — ABNORMAL LOW (ref 6–20)
CO2: 23 mmol/L (ref 22–32)
Calcium: 8.5 mg/dL — ABNORMAL LOW (ref 8.9–10.3)
Chloride: 105 mmol/L (ref 98–111)
Creatinine, Ser: 0.73 mg/dL (ref 0.44–1.00)
GFR, Estimated: >60 mL/min (ref >60)
Glucose, Bld: 130 mg/dL — ABNORMAL HIGH (ref 70–99)
Potassium: 3.2 mmol/L — ABNORMAL LOW (ref 3.5–5.1)
Sodium: 138 mmol/L (ref 135–145)
Total Bilirubin: 0.8 mg/dL (ref 0.3–1.2)
Total Protein: 5.8 g/dL — ABNORMAL LOW (ref 6.5–8.1)

## 2022-11-02 LAB — CBC
HCT: 22.9 % — ABNORMAL LOW (ref 36.0–46.0)
Hemoglobin: 7.9 g/dL — ABNORMAL LOW (ref 12.0–15.0)
MCH: 29.2 pg (ref 26.0–34.0)
MCHC: 34.5 g/dL (ref 30.0–36.0)
MCV: 84.5 fL (ref 80.0–100.0)
Platelets: 473 10*3/uL — ABNORMAL HIGH (ref 150–400)
RBC: 2.71 MIL/uL — ABNORMAL LOW (ref 3.87–5.11)
RDW: 14 % (ref 11.5–15.5)
WBC: 18.3 10*3/uL — ABNORMAL HIGH (ref 4.0–10.5)
nRBC: 0.2 % (ref 0.0–0.2)

## 2022-11-02 LAB — GLUCOSE, CAPILLARY
Glucose-Capillary: 121 mg/dL — ABNORMAL HIGH (ref 70–99)
Glucose-Capillary: 120 mg/dL — ABNORMAL HIGH (ref 70–99)
Glucose-Capillary: 132 mg/dL — ABNORMAL HIGH (ref 70–99)
Glucose-Capillary: 137 mg/dL — ABNORMAL HIGH (ref 70–99)
Glucose-Capillary: 132 mg/dL — ABNORMAL HIGH (ref 70–99)

## 2022-11-02 LAB — TRIGLYCERIDES: Triglycerides: 85 mg/dL (ref <150)

## 2022-11-02 LAB — MAGNESIUM: Magnesium: 1.6 mg/dL — ABNORMAL LOW (ref 1.7–2.4)

## 2022-11-02 LAB — PHOSPHORUS: Phosphorus: 2.5 mg/dL (ref 2.5–4.6)

## 2022-11-02 LAB — PROCALCITONIN: Procalcitonin: 0.27 ng/mL

## 2022-11-02 MED ORDER — PANTOPRAZOLE SODIUM 40 MG IV SOLR
40.0000 mg | INTRAVENOUS | Status: DC
  Administered 2022-11-02 – 2022-11-15 (×14): 40 mg via INTRAVENOUS
  Filled 2022-11-02 (×14): qty 10

## 2022-11-02 MED ORDER — POTASSIUM PHOSPHATES 15 MMOLE/5ML IV SOLN
15.0000 mmol | Freq: Once | INTRAVENOUS | Status: AC
  Administered 2022-11-02: 15 mmol via INTRAVENOUS
  Filled 2022-11-02: qty 5

## 2022-11-02 MED ORDER — CHLORHEXIDINE GLUCONATE CLOTH 2 % EX PADS
6.0000 | MEDICATED_PAD | Freq: Every day | CUTANEOUS | Status: DC
  Administered 2022-11-02 – 2022-11-19 (×18): 6 via TOPICAL

## 2022-11-02 MED ORDER — TRAVASOL 10 % IV SOLN
INTRAVENOUS | Status: AC
  Filled 2022-11-02: qty 499.2

## 2022-11-02 MED ORDER — MAGNESIUM SULFATE 4 GM/100ML IV SOLN
4.0000 g | Freq: Once | INTRAVENOUS | Status: AC
  Administered 2022-11-02: 4 g via INTRAVENOUS
  Filled 2022-11-02: qty 100

## 2022-11-02 MED ORDER — POTASSIUM CHLORIDE 10 MEQ/100ML IV SOLN
10.0000 meq | INTRAVENOUS | Status: AC
  Administered 2022-11-02 (×3): 10 meq via INTRAVENOUS
  Filled 2022-11-02 (×3): qty 100

## 2022-11-02 MED ORDER — DIATRIZOATE MEGLUMINE & SODIUM 66-10 % PO SOLN
90.0000 mL | Freq: Once | ORAL | Status: AC
  Administered 2022-11-03: 90 mL via NASOGASTRIC
  Filled 2022-11-02: qty 90

## 2022-11-02 MED ORDER — LACTATED RINGERS IV SOLN: INTRAVENOUS | Status: AC

## 2022-11-02 MED ORDER — SODIUM CHLORIDE 0.9% FLUSH
10.0000 mL | INTRAVENOUS | Status: DC | PRN
  Administered 2022-11-06 – 2022-11-07 (×2): 10 mL

## 2022-11-02 MED ORDER — POTASSIUM CHLORIDE 10 MEQ/100ML IV SOLN
10.0000 meq | INTRAVENOUS | Status: AC
  Administered 2022-11-02: 10 meq via INTRAVENOUS
  Filled 2022-11-02: qty 100

## 2022-11-02 NOTE — Progress Notes (Signed)
Patient is alert and oriented, complained of abdominal pain, gave her PRN pain med, pain reduced after pain medication, TPN and IvF running. Ordered for NG tube insertion, while inserting halfway patient grabbed the tube and said she can't do it, giving her some time to re insert the tube.

## 2022-11-02 NOTE — Plan of Care (Signed)
  Problem: Education: Goal: Ability to describe self-care measures that may prevent or decrease complications (Diabetes Survival Skills Education) will improve Outcome: Progressing   Problem: Fluid Volume: Goal: Ability to maintain a balanced intake and output will improve Outcome: Progressing   Problem: Nutritional: Goal: Maintenance of adequate nutrition will improve Outcome: Progressing   Problem: Skin Integrity: Goal: Risk for impaired skin integrity will decrease Outcome: Progressing   Problem: Education: Goal: Knowledge of General Education information will improve Description: Including pain rating scale, medication(s)/side effects and non-pharmacologic comfort measures Outcome: Progressing   Problem: Elimination: Goal: Will not experience complications related to urinary retention Outcome: Progressing

## 2022-11-02 NOTE — Progress Notes (Addendum)
[facility]  Date of Admission:  10/23/2022   Total days of inpatient antibiotics 9  Principal Problem:   Acute encephalopathy Active Problems:   SJOGREN'S SYNDROME   Hypertension complicating diabetes   Chronic systolic CHF (congestive heart failure)   QT prolongation   Type 2 diabetes mellitus with hyperlipidemia   History of CVA (cerebrovascular accident)   Major depressive disorder with single episode, in partial remission   SLE (systemic lupus erythematosus related syndrome)   PID (acute pelvic inflammatory disease)          Assessment: 52 year old female with pelvic abscess on immunosuppression   #Pelvic abscess status post CT-guided drainage with drain placement #New loculated rim-enhancing collection in right lower quadrant #Leukocytosis in the setting of new loculated rim-enhancing collection #Persistent ileus-started TPN - 3/24 CT abdomen pelvis with contrast showed diffuse inflammatory changes and pelvic stranding, new since prior CT on 3/15.  Concern for PID with developing abscess(5x5cm), small amount of hemorrhagic content may be present pelvis possibility of hemorrhagic cyst that is ruptured.  - Patient underwent CT-guided drainage with IR on 3/26,  left tubo-ovarian fluid collection was localized, pigtail catheter placed with 60 mL fluid aspirated.  Cultures grew strep anginosus, Prevotella bivia. Patient has been transitioned to Unasyn - Xray on 3/30 showed small bowel dilatation suggesting SBO vs ileus -Repeat CT abdomen pelvis on 4/2 showed new loculated rim-enhancing fluid collection in right lower quadrant abusing several loops of distal small bowel measuring 3.2 X7.4X 5 cm.  Developing mild mid small bowel obstruction with a single point of transition.  Percutaneous drainage of the left adnexa with complete evacuation. Recommendations:  -Continue Unasyn, monitor clinically.   -Leukocytosis trending down.  I suspect new  loculated rim-enhancing collection is likely in the setting of SBO.  If patient fevers WBC trended up we will plan on escalating antibiotics. -Engage cardiology for new intra-abdominal abscess -Hold Benlysta/Imuran    #History of recurrent nausea and vomiting since at least 2014, multiple hospitalizations --Of note patient has had multiple hospitalizations for nausea and vomiting, etiology seems to be unclear.  Looks like during 3/16 hospitalization patient had declined upper endoscopy.  She has had 3 EGDs previously, most recent was less than 2 years ago.  Suspicion for any significant pathology or malignancy was extremely low per GIs note on 3/19.     #Lupus(+ ANA, + SSA, SICCA symptoms, alopecia, oral ulcers, arthralgia) on immunosuppression -I spoke with Dr. Rogers Blocker, hold imuran till 2 weeks following discharge. Plan to follow with rheumatology in 6 to 8 weeks following discharge, at that point consider restarting Benlysta.     Microbiology:   Antibiotics: Fluconazole 3/26-p Doxy 3/24-p Augmentin 3/31-p Ceftriaxone 3/24-3/25 Metro 3/24-3/26 Merrem 3/26-3/31     Cultures: Blood 3/26 NG   Other 3/26 strep anginosus and prevotella bivia   Urine  3/31 <10k colonies   SUBJECTIVE: Resting in bed no new complaints.  Interval: Afebrile overnight. Wbc 18K  Review of Systems: Review of Systems  All other systems reviewed and are negative.    Scheduled Meds:  apixaban  2.5 mg Oral BID   carvedilol  3.125 mg Oral BID WC   Chlorhexidine Gluconate Cloth  6 each Topical Daily   insulin aspart  0-9 Units Subcutaneous Q4H   insulin glargine-yfgn  10 Units Subcutaneous Daily   levothyroxine  50 mcg Oral Q0600   pantoprazole  40 mg Oral Daily   QUEtiapine  200 mg  Oral QHS   sodium chloride flush  3 mL Intravenous Q12H   sodium chloride flush  5 mL Intracatheter Q8H   Continuous Infusions:  ampicillin-sulbactam (UNASYN) IV 3 g (11/02/22 0759)   dextrose 5 % and 0.9% NaCl 75  mL/hr at 11/02/22 1117   magnesium sulfate bolus IVPB     potassium chloride     potassium PHOSPHATE IVPB (in mmol)     TPN ADULT (ION)     PRN Meds:.acetaminophen **OR** acetaminophen, HYDROmorphone (DILAUDID) injection, hydrOXYzine, ondansetron (ZOFRAN) IV, senna-docusate, sodium chloride flush Allergies  Allergen Reactions   Metoclopramide Other (See Comments)    Developed restless leg, akathisia type limb movements.    Codeine Itching and Rash   Hydrocodone Rash   Percocet [Oxycodone-Acetaminophen] Hives and Rash    Tolerates dilaudid Tolerates acetaminophen     OBJECTIVE: Vitals:   11/02/22 0331 11/02/22 0500 11/02/22 0800 11/02/22 1128  BP: (!) 146/74  132/61 132/67  Pulse:   91 87  Resp: 17  17 18   Temp: 98.2 F (36.8 C)  98.4 F (36.9 C) 98.1 F (36.7 C)  TempSrc: Axillary  Oral Oral  SpO2:   100% 100%  Weight:  (!) 164 kg    Height:       Body mass index is 54.97 kg/m.  Physical Exam Constitutional:      Appearance: Normal appearance.  HENT:     Head: Normocephalic and atraumatic.     Right Ear: Tympanic membrane normal.     Left Ear: Tympanic membrane normal.     Nose: Nose normal.     Mouth/Throat:     Mouth: Mucous membranes are moist.  Eyes:     Extraocular Movements: Extraocular movements intact.     Conjunctiva/sclera: Conjunctivae normal.     Pupils: Pupils are equal, round, and reactive to light.  Cardiovascular:     Rate and Rhythm: Normal rate and regular rhythm.     Heart sounds: No murmur heard.    No friction rub. No gallop.  Pulmonary:     Effort: Pulmonary effort is normal.     Breath sounds: Normal breath sounds.  Abdominal:     General: Abdomen is flat.     Palpations: Abdomen is soft.  Skin:    General: Skin is warm and dry.  Neurological:     General: No focal deficit present.     Mental Status: She is alert and oriented to person, place, and time.  Psychiatric:        Mood and Affect: Mood normal.       Lab  Results Lab Results  Component Value Date   WBC 18.3 (H) 11/02/2022   HGB 7.9 (L) 11/02/2022   HCT 22.9 (L) 11/02/2022   MCV 84.5 11/02/2022   PLT 473 (H) 11/02/2022    Lab Results  Component Value Date   CREATININE 0.73 11/02/2022   BUN <5 (L) 11/02/2022   NA 138 11/02/2022   K 3.2 (L) 11/02/2022   CL 105 11/02/2022   CO2 23 11/02/2022    Lab Results  Component Value Date   ALT 14 11/02/2022   AST 12 (L) 11/02/2022   ALKPHOS 111 11/02/2022   BILITOT 0.8 11/02/2022        Laurice Record, [place] [facility] 11/02/2022, 12:03 PM

## 2022-11-02 NOTE — Consult Note (Signed)
EVYN DENNEY March 11, 1971  [id number].    Requesting MD: Raiford Noble Chief Complaint/Reason for Consult: intra-abdominal abscess  HPI:  JESUSITA LUCKENBAUGH is a 52yo female with multiple co-morbidities including Lupus on chronic immunosuppressive, systolic/diastolic CHF with previous ejection fraction of 30 to 35% and grade 1 diastolic dysfunction, chronic right ventricular failure, history of DVT, hypothyroidism, depression and anxiety and chronic abdominal pain who was admitted to [facility] 3/24 with altered mental status. Work up included CT a/p which showed diffuse inflammatory changes and stranding of the pelvic fat, possibly representing an inflammatory/infectious process such as PID with developing abscess. EDP performed a pelvic exam and saw discharge and cervical motion tenderness concerning for PID. Gonorrhea/chlamydia negative. GYN, Dr. Wyman Songster, consulted and said because she has been abstinent for 4 years there was low suspicion for PID. Patient was admitted to the medical service and started on broad spectrum IV antibiotics. IR consulted and placed a drain into a left tubo-ovarian fluid collection yielding 60cc of purulent material on 3/26. Culture Strep anginosis, GBS, prevotella bivia. ID consulted for antibiotic recommendations.  Despite management above patient continued to have abdominal pain, nausea, and vomiting. No flatus or bm in the last several days. CT a/p was repeated yesterday and showed interval percutaneous drainage of the loculated inflammatory appearing fluid collection within the left adnexa with complete evacuation of the previously noted loculated fluid; extensive surrounding inflammatory change and poorly loculated fluid within the right adnexa persist; New loculated rim enhancing fluid collection within the right lower quadrant abutting several loops of a distal small bowel measuring 3.2 x 7.4 x 5.0 cm; appendix not visualized; Developing mid small bowel obstruction with a  single point of transition identified within the mid abdomen. General surgery asked to see today.  Prior Abdominal Surgeries: umbilical hernia repair in the 1980's Blood Thinners: Eliquis (last dose this am) Tobacco Use: former smoker, quit in 1992 Alcohol Use: none Substance use: none   ROS: ROS As above, see hpi  Family History  Problem Relation Age of Onset   Diabetes Mother    Asthma Mother    Urticaria Mother    Hypertension Mother    Stroke Mother    Ovarian cancer Sister 12       maternal half-sister; RAD51C positive   Asthma Brother    Asthma Brother    Breast cancer Maternal Aunt 25       currently 106   Cancer Maternal Aunt        unk. primary   Cancer Maternal Aunt        unk. primary   Ovarian cancer Maternal Aunt        deceased 26s   Breast cancer Maternal Aunt        deceased 32s   Breast cancer Maternal Aunt    Stomach cancer Maternal Uncle        dx 24s; deceased   Prostate cancer Maternal Uncle        deceased 8s   Breast cancer Cousin        deceased 58s; daughter of mat uncle with stomach ca   Hypertension Other    Stroke Other    Arthritis Other    Allergic rhinitis Neg Hx    Angioedema Neg Hx     Past Medical History:  Diagnosis Date   Acute renal failure (HCC)     Intractable nausea vomiting secondary to diabetic gastroparesis causing dehydration and acute renal failure /notes 04/01/2013   Anginal  pain ([place])    Anxiety    Bell's palsy 08/09/2010   Bicornuate uterus    CAP (community acquired pneumonia) 10/2011   Archie Endo 11/08/2011   Chest pain 01/13/2014   CHF (congestive heart failure) ([place])    Diabetic gastroparesis associated with type 2 diabetes mellitus ([place])    this is presumed diagnoses, not confirmed by any studies.    Eczema    Family history of breast cancer    Family history of malignant neoplasm of breast    Family history of malignant neoplasm of ovary    Family history of ovarian cancer    Family history of prostate  cancer    GERD (gastroesophageal reflux disease)    High cholesterol    Hypertension    Liver lesion 08/13/2019   Liver mass 2014   biopsied 03/2013 at [facility], not malignant.  is to undergo radiologic ablation of the mass in sept/October 2014.    Lupus ([place])    Migraines    "maybe a couple times/yr" (04/01/2013)   Obesity    Obstructive sleep apnea on CPAP 2011   Oxygen at nights   Pulmonary embolism ([place]) 05/21/2021   SJOGREN'S SYNDROME 08/09/2010   SLE (systemic lupus erythematosus) ([place])    Archie Endo 12/01/2010    Past Surgical History:  Procedure Laterality Date   BREAST CYST EXCISION Left 08/2005   epidermoid   CARDIAC CATHETERIZATION  02/07/2012   ECTOPIC PREGNANCY SURGERY  1999   ESOPHAGOGASTRODUODENOSCOPY N/A 02/26/2013   Procedure: ESOPHAGOGASTRODUODENOSCOPY (EGD);  Surgeon: Irene Shipper, MD;  Location: [facility] ENDOSCOPY;  Service: Endoscopy;  Laterality: N/A;   ESOPHAGOGASTRODUODENOSCOPY N/A 03/02/2015   Procedure: ESOPHAGOGASTRODUODENOSCOPY (EGD);  Surgeon: Milus Banister, MD;  Location: [place];  Service: Endoscopy;  Laterality: N/A;   ESOPHAGOGASTRODUODENOSCOPY N/A 02/23/2021   Procedure: ESOPHAGOGASTRODUODENOSCOPY (EGD);  Surgeon: Wilford Corner, MD;  Location: [place];  Service: Endoscopy;  Laterality: N/A;  possible dilation   HERNIA REPAIR     LEFT AND RIGHT HEART CATHETERIZATION WITH CORONARY ANGIOGRAM N/A 02/07/2012   Procedure: LEFT AND RIGHT HEART CATHETERIZATION WITH CORONARY ANGIOGRAM;  Surgeon: Laverda Page, MD;  Location: [facility] CATH LAB;  Service: Cardiovascular;  Laterality: N/A;   LIVER BIOPSY  03/2013   liver mass/medical hx noted above   MUSCLE BIOPSY     for lupus/notes [id number]   UMBILICAL HERNIA REPAIR  1980's    Social History:  reports that she quit smoking about 31 years ago. Her smoking use included cigarettes. She has a 1.50 pack-year smoking history. She has never used smokeless tobacco. She reports that she does not drink  alcohol and does not use drugs.  Allergies:  Allergies  Allergen Reactions   Metoclopramide Other (See Comments)    Developed restless leg, akathisia type limb movements.    Codeine Itching and Rash   Hydrocodone Rash   Percocet [Oxycodone-Acetaminophen] Hives and Rash    Tolerates dilaudid Tolerates acetaminophen     Medications Prior to Admission  Medication Sig Dispense Refill   ADVAIR HFA 230-21 MCG/ACT inhaler Inhale 2 puffs into the lungs 2 (two) times daily.     albuterol (PROVENTIL) (2.5 MG/3ML) 0.083% nebulizer solution Take 2.5 mg by nebulization as needed for wheezing.     ALPRAZolam (XANAX) 0.5 MG tablet Take 0.5 mg by mouth as needed for anxiety or sleep.     apixaban (ELIQUIS) 2.5 MG TABS tablet Take 1 tablet (2.5 mg total) by mouth 2 (two) times daily. 180 tablet 3  ARIPiprazole (ABILIFY) 5 MG tablet      Azathioprine 75 MG TABS Take 150 mg by mouth 2 (two) times daily.     B-D UF III MINI PEN NEEDLES 31G X 5 MM MISC USE AS DIRECTED TWICE A DAY 100 each 2   Belimumab (BENLYSTA) 200 MG/ML SOAJ Inject 200 mg into the skin once a week. Thursdays     buPROPion (WELLBUTRIN XL) 300 MG 24 hr tablet Take 300 mg by mouth daily.     carvedilol (COREG) 25 MG tablet Take 1.5 tablets (37.5 mg total) by mouth 2 (two) times daily with a meal. 270 tablet 3   Continuous Blood Gluc Sensor (FREESTYLE LIBRE 3 SENSOR) MISC 1 Device by Does not apply route every 14 (fourteen) days. Apply 1 sensor on upper arm every 14 days for continuous glucose monitoring 2 each 2   cromolyn (OPTICROM) 4 % ophthalmic solution Place 1 drop into both eyes 2 (two) times daily.     cycloSPORINE (RESTASIS) 0.05 % ophthalmic emulsion Place 1 drop into both eyes in the morning, at noon, and at bedtime.      diclofenac Sodium (VOLTAREN) 1 % GEL APPLY 2 GRAMS TO AFFECTED AREA 4 TIMES A DAY (Patient taking differently: Apply 2 g topically 4 (four) times daily as needed (pain).) 400 g 1   EASY TOUCH PEN NEEDLES 32G X 4  MM MISC USE TO INJECT INSULIN TWICE DAILY 100 each 9   famotidine (PEPCID) 20 MG tablet Take 20 mg by mouth 2 (two) times daily.     fluticasone (FLONASE) 50 MCG/ACT nasal spray Place 2 sprays into both nostrils continuous as needed for allergies or rhinitis.     glucose blood (ACCU-CHEK GUIDE) test strip 1 each by Other route 2 (two) times daily. And lancets 2/day 200 each 3   glucose blood (ONETOUCH ULTRA) test strip Test blood sugar 4 times a day 100 each 12   hydroxychloroquine (PLAQUENIL) 200 MG tablet Take 200 mg by mouth 2 (two) times daily.     ibuprofen (ADVIL) 600 MG tablet Take 1 tablet (600 mg total) by mouth every 6 (six) hours as needed. (Patient taking differently: Take 600 mg by mouth every 6 (six) hours as needed for mild pain.) 30 tablet 0   insulin regular human CONCENTRATED (HUMULIN R U-500 KWIKPEN) 500 UNIT/ML KwikPen 100 UNITS, 30 MINUTES BEFORE EACH MEAL (Patient taking differently: 100 Units 3 (three) times daily with meals. 100 Units, 30 minutes before each meal) 6 mL 1   Insulin Syringe-Needle U-100 (EASY TOUCH INSULIN SYRINGE) 31G X 5/16" 1 ML MISC USE AS DIRECTED THREE TIMES A DAY 100 each 10   JARDIANCE 10 MG TABS tablet TAKE 1 TABLET (10 MG TOTAL) BY MOUTH DAILY WITH BREAKFAST. 30 tablet 3   Lancets (ONETOUCH ULTRASOFT) lancets Test blood sugar 4 times a day 100 each 12   levothyroxine (SYNTHROID) 50 MCG tablet Take 1 tablet (50 mcg total) by mouth daily at 6 (six) AM.     megestrol (MEGACE) 40 MG tablet Take 40 mg by mouth daily.  3   metFORMIN (GLUCOPHAGE-XR) 500 MG 24 hr tablet Take 2 tablets (1,000 mg total) by mouth 2 (two) times daily. 180 tablet 3   Multiple Vitamin (MULTIVITAMIN WITH MINERALS) TABS tablet Take 1 tablet by mouth daily.     ondansetron (ZOFRAN-ODT) 4 MG disintegrating tablet Take 1 tablet (4 mg total) by mouth every 8 (eight) hours as needed for nausea or vomiting. 10 tablet 0  oxybutynin (DITROPAN-XL) 5 MG 24 hr tablet Take 5 mg by mouth daily.      pregabalin (LYRICA) 75 MG capsule Take 75 mg 3 (three) times daily by mouth.      prochlorperazine (COMPAZINE) 10 MG tablet Take 1 tablet (10 mg total) by mouth every 6 (six) hours as needed for nausea or vomiting. 30 tablet 0   promethazine (PHENERGAN) 25 MG suppository Place 1 suppository (25 mg total) rectally every 6 (six) hours as needed for nausea or vomiting. 12 each 0   QUEtiapine (SEROQUEL) 300 MG tablet Take 300 mg by mouth at bedtime.     rosuvastatin (CRESTOR) 20 MG tablet Take 1 tablet (20 mg total) by mouth daily. 90 tablet 3   Semaglutide, 2 MG/DOSE, 8 MG/3ML SOPN Inject 2 mg as directed once a week. 3 mL 1   sucralfate (CARAFATE) 1 GM/10ML suspension Take 10 mLs by mouth 2 (two) times daily as needed (abdomen pain).     topiramate (TOPAMAX) 25 MG tablet Take 75 mg by mouth at bedtime.     torsemide (DEMADEX) 20 MG tablet Take 1 tablet (20 mg total) by mouth at bedtime. 90 tablet 3   TRANSDERM-SCOP 1 MG/3DAYS Place 1 patch (1.5 mg total) onto the skin every 3 (three) days. As needed for nausea 10 patch 0   Vitamin D, Ergocalciferol, (DRISDOL) 1.25 MG (50000 UNIT) CAPS capsule Take 50,000 Units by mouth once a week.       Physical Exam: Blood pressure 132/67, pulse 87, temperature 98.1 F (36.7 C), temperature source Oral, resp. rate 18, height 5\' 8"  (1.727 m), weight (!) 164 kg, last menstrual period 03/24/2017, SpO2 100 %. General: pleasant, WD/WN female who is laying in bed in NAD HEENT: head is normocephalic, atraumatic.  Sclera are noninjected.  PERRL.  Ears and nose without any masses or lesions.  Mouth is pink and moist. Dentition fair Heart: regular, rate, and rhythm.   Lungs: CTAB, no wheezes, rhonchi, or rales noted.  Respiratory effort nonlabored Abd:  Obese, soft, generalized ttp that she states is worse on the left side of her abdomen. No rigidity or rebound. Hypoactive BS.  Skin: warm and dry    Results for orders placed or performed during the hospital  encounter of 10/23/22 (from the past 48 hour(s))  Glucose, capillary     Status: Abnormal   Collection Time: 10/31/22  3:58 PM  Result Value Ref Range   Glucose-Capillary 163 (H) 70 - 99 mg/dL    Comment: Glucose reference range applies only to samples taken after fasting for at least 8 hours.  Glucose, capillary     Status: Abnormal   Collection Time: 10/31/22  8:01 PM  Result Value Ref Range   Glucose-Capillary 146 (H) 70 - 99 mg/dL    Comment: Glucose reference range applies only to samples taken after fasting for at least 8 hours.  Glucose, capillary     Status: Abnormal   Collection Time: 10/31/22 11:52 PM  Result Value Ref Range   Glucose-Capillary 153 (H) 70 - 99 mg/dL    Comment: Glucose reference range applies only to samples taken after fasting for at least 8 hours.   Comment 1 Notify RN    Comment 2 Document in Chart   Glucose, capillary     Status: Abnormal   Collection Time: 11/01/22  3:43 AM  Result Value Ref Range   Glucose-Capillary 152 (H) 70 - 99 mg/dL    Comment: Glucose reference range applies only to  samples taken after fasting for at least 8 hours.   Comment 1 Notify RN    Comment 2 Document in Chart   CBC     Status: Abnormal   Collection Time: 11/01/22  5:32 AM  Result Value Ref Range   WBC 22.2 (H) 4.0 - 10.5 K/uL   RBC 2.71 (L) 3.87 - 5.11 MIL/uL   Hemoglobin 7.9 (L) 12.0 - 15.0 g/dL   HCT 22.9 (L) 36.0 - 46.0 %   MCV 84.5 80.0 - 100.0 fL   MCH 29.2 26.0 - 34.0 pg   MCHC 34.5 30.0 - 36.0 g/dL   RDW 13.7 11.5 - 15.5 %   Platelets 442 (H) 150 - 400 K/uL   nRBC 0.2 0.0 - 0.2 %    Comment: Performed at [facility] [address], [place], [place] [address]  Procalcitonin     Status: None   Collection Time: 11/01/22  5:32 AM  Result Value Ref Range   Procalcitonin 0.32 ng/mL    Comment:        Interpretation: PCT (Procalcitonin) <= 0.5 ng/mL: Systemic infection (sepsis) is not likely. Local bacterial infection is possible. (NOTE)        Sepsis PCT Algorithm           Lower Respiratory Tract                                      Infection PCT Algorithm    ----------------------------     ----------------------------         PCT < 0.25 ng/mL                PCT < 0.10 ng/mL          Strongly encourage             Strongly discourage   discontinuation of antibiotics    initiation of antibiotics    ----------------------------     -----------------------------       PCT 0.25 - 0.50 ng/mL            PCT 0.10 - 0.25 ng/mL               OR       >80% decrease in PCT            Discourage initiation of                                            antibiotics      Encourage discontinuation           of antibiotics    ----------------------------     -----------------------------         PCT >= 0.50 ng/mL              PCT 0.26 - 0.50 ng/mL               AND        <80% decrease in PCT             Encourage initiation of  antibiotics       Encourage continuation           of antibiotics    ----------------------------     -----------------------------        PCT >= 0.50 ng/mL                  PCT > 0.50 ng/mL               AND         increase in PCT                  Strongly encourage                                      initiation of antibiotics    Strongly encourage escalation           of antibiotics                                     -----------------------------                                           PCT <= 0.25 ng/mL                                                 OR                                        > 80% decrease in PCT                                      Discontinue / Do not initiate                                             antibiotics  Performed at [facility], [address], [place], [place] [id number]   Basic metabolic panel     Status: Abnormal   Collection Time: 11/01/22  5:32 AM  Result Value Ref Range   Sodium 137 135 - 145 mmol/L   Potassium  3.6 3.5 - 5.1 mmol/L   Chloride 106 98 - 111 mmol/L   CO2 23 22 - 32 mmol/L   Glucose, Bld 130 (H) 70 - 99 mg/dL    Comment: Glucose reference range applies only to samples taken after fasting for at least 8 hours.   BUN 7 6 - 20 mg/dL   Creatinine, Ser 0.80 0.44 - 1.00 mg/dL   Calcium 8.4 (L) 8.9 - 10.3 mg/dL   GFR, Estimated >60 >60 mL/min    Comment: (NOTE) Calculated using the CKD-EPI Creatinine Equation (2021)    Anion gap 8 5 - 15    Comment: Performed at [facility] [address], [place], [place] [address]  Magnesium     Status: None   Collection Time: 11/01/22  5:32 AM  Result Value Ref Range   Magnesium 1.7 1.7 - 2.4 mg/dL    Comment: Performed at [facility] [address], [place], [place] [address]  Glucose, capillary     Status: Abnormal   Collection Time: 11/01/22  8:08 AM  Result Value Ref Range   Glucose-Capillary 131 (H) 70 - 99 mg/dL    Comment: Glucose reference range applies only to samples taken after fasting for at least 8 hours.   Comment 1 Notify RN    Comment 2 Document in Chart   Glucose, capillary     Status: Abnormal   Collection Time: 11/01/22 11:29 AM  Result Value Ref Range   Glucose-Capillary 137 (H) 70 - 99 mg/dL    Comment: Glucose reference range applies only to samples taken after fasting for at least 8 hours.  CBC with Differential/Platelet     Status: Abnormal   Collection Time: 11/01/22 11:46 AM  Result Value Ref Range   WBC 22.3 (H) 4.0 - 10.5 K/uL    Comment: REPEATED TO VERIFY WHITE COUNT CONFIRMED ON SMEAR    RBC 2.74 (L) 3.87 - 5.11 MIL/uL   Hemoglobin 8.0 (L) 12.0 - 15.0 g/dL   HCT 23.3 (L) 36.0 - 46.0 %   MCV 85.0 80.0 - 100.0 fL   MCH 29.2 26.0 - 34.0 pg   MCHC 34.3 30.0 - 36.0 g/dL   RDW 13.5 11.5 - 15.5 %   Platelets 456 (H) 150 - 400 K/uL   nRBC 0.2 0.0 - 0.2 %   Neutrophils Relative % 70 %   Neutro Abs 15.7 (H) 1.7 - 7.7 K/uL   Lymphocytes Relative 14 %   Lymphs Abs 3.1 0.7 - 4.0 K/uL   Monocytes  Relative 10 %   Monocytes Absolute 2.1 (H) 0.1 - 1.0 K/uL   Eosinophils Relative 3 %   Eosinophils Absolute 0.6 (H) 0.0 - 0.5 K/uL   Basophils Relative 0 %   Basophils Absolute 0.1 0.0 - 0.1 K/uL   WBC Morphology Mild Left Shift (1-5% metas, occ myelo)    RBC Morphology MORPHOLOGY UNREMARKABLE    Smear Review Normal platelet morphology    Immature Granulocytes 3 %   Abs Immature Granulocytes 0.65 (H) 0.00 - 0.07 K/uL    Comment: Performed at [facility], [address] [place], [place] [address]  Glucose, capillary     Status: Abnormal   Collection Time: 11/01/22  4:09 PM  Result Value Ref Range   Glucose-Capillary 138 (H) 70 - 99 mg/dL    Comment: Glucose reference range applies only to samples taken after fasting for at least 8 hours.   Comment 1 Notify RN    Comment 2 Document in Chart   Glucose, capillary     Status: Abnormal   Collection Time: 11/01/22  7:33 PM  Result Value Ref Range   Glucose-Capillary 131 (H) 70 - 99 mg/dL    Comment: Glucose reference range applies only to samples taken after fasting for at least 8 hours.   Comment 1 Notify RN    Comment 2 Document in Chart   Glucose, capillary     Status: Abnormal   Collection Time: 11/01/22 11:08 PM  Result Value Ref Range   Glucose-Capillary 111 (H) 70 - 99 mg/dL    Comment: Glucose reference range applies only to samples taken after fasting for at least 8 hours.  Comment 1 Notify RN    Comment 2 Document in Chart   Glucose, capillary     Status: Abnormal   Collection Time: 11/02/22  3:30 AM  Result Value Ref Range   Glucose-Capillary 121 (H) 70 - 99 mg/dL    Comment: Glucose reference range applies only to samples taken after fasting for at least 8 hours.   Comment 1 Notify RN    Comment 2 Document in Chart   Procalcitonin     Status: None   Collection Time: 11/02/22  5:00 AM  Result Value Ref Range   Procalcitonin 0.27 ng/mL    Comment:        Interpretation: PCT (Procalcitonin) <= 0.5  ng/mL: Systemic infection (sepsis) is not likely. Local bacterial infection is possible. (NOTE)       Sepsis PCT Algorithm           Lower Respiratory Tract                                      Infection PCT Algorithm    ----------------------------     ----------------------------         PCT < 0.25 ng/mL                PCT < 0.10 ng/mL          Strongly encourage             Strongly discourage   discontinuation of antibiotics    initiation of antibiotics    ----------------------------     -----------------------------       PCT 0.25 - 0.50 ng/mL            PCT 0.10 - 0.25 ng/mL               OR       >80% decrease in PCT            Discourage initiation of                                            antibiotics      Encourage discontinuation           of antibiotics    ----------------------------     -----------------------------         PCT >= 0.50 ng/mL              PCT 0.26 - 0.50 ng/mL               AND        <80% decrease in PCT             Encourage initiation of                                             antibiotics       Encourage continuation           of antibiotics    ----------------------------     -----------------------------        PCT >= 0.50 ng/mL                  PCT > 0.50 ng/mL  AND         increase in PCT                  Strongly encourage                                      initiation of antibiotics    Strongly encourage escalation           of antibiotics                                     -----------------------------                                           PCT <= 0.25 ng/mL                                                 OR                                        > 80% decrease in PCT                                      Discontinue / Do not initiate                                             antibiotics  Performed at [facility], [address], [place], [place] [address]   Glucose, capillary     Status: Abnormal    Collection Time: 11/02/22  8:27 AM  Result Value Ref Range   Glucose-Capillary 120 (H) 70 - 99 mg/dL    Comment: Glucose reference range applies only to samples taken after fasting for at least 8 hours.  Triglycerides     Status: None   Collection Time: 11/02/22 10:51 AM  Result Value Ref Range   Triglycerides 85 <150 mg/dL    Comment: Performed at [facility] [address], [place], [place] [address]  Comprehensive metabolic panel     Status: Abnormal   Collection Time: 11/02/22 10:53 AM  Result Value Ref Range   Sodium 138 135 - 145 mmol/L   Potassium 3.2 (L) 3.5 - 5.1 mmol/L   Chloride 105 98 - 111 mmol/L   CO2 23 22 - 32 mmol/L   Glucose, Bld 130 (H) 70 - 99 mg/dL    Comment: Glucose reference range applies only to samples taken after fasting for at least 8 hours.   BUN <5 (L) 6 - 20 mg/dL   Creatinine, Ser 0.73 0.44 - 1.00 mg/dL   Calcium 8.5 (L) 8.9 - 10.3 mg/dL   Total Protein 5.8 (L) 6.5 - 8.1 g/dL   Albumin 1.7 (L) 3.5 - 5.0 g/dL   AST 12 (L) 15 - 41 U/L  ALT 14 0 - 44 U/L   Alkaline Phosphatase 111 38 - 126 U/L   Total Bilirubin 0.8 0.3 - 1.2 mg/dL   GFR, Estimated >60 >60 mL/min    Comment: (NOTE) Calculated using the CKD-EPI Creatinine Equation (2021)    Anion gap 10 5 - 15    Comment: Performed at [facility] [address], [place], [place] [address]  Magnesium     Status: Abnormal   Collection Time: 11/02/22 10:53 AM  Result Value Ref Range   Magnesium 1.6 (L) 1.7 - 2.4 mg/dL    Comment: Performed at [facility] [address], [place], [place] [address]  Phosphorus     Status: None   Collection Time: 11/02/22 10:53 AM  Result Value Ref Range   Phosphorus 2.5 2.5 - 4.6 mg/dL    Comment: Performed at [facility] [address], [place], [place] [address]  CBC     Status: Abnormal   Collection Time: 11/02/22 10:53 AM  Result Value Ref Range   WBC 18.3 (H) 4.0 - 10.5 K/uL   RBC 2.71 (L) 3.87 - 5.11 MIL/uL   Hemoglobin 7.9  (L) 12.0 - 15.0 g/dL   HCT 22.9 (L) 36.0 - 46.0 %   MCV 84.5 80.0 - 100.0 fL   MCH 29.2 26.0 - 34.0 pg   MCHC 34.5 30.0 - 36.0 g/dL   RDW 14.0 11.5 - 15.5 %   Platelets 473 (H) 150 - 400 K/uL   nRBC 0.2 0.0 - 0.2 %    Comment: Performed at [facility], [place] [address] [place], [place] [address]  Glucose, capillary     Status: Abnormal   Collection Time: 11/02/22 12:41 PM  Result Value Ref Range   Glucose-Capillary 132 (H) 70 - 99 mg/dL    Comment: Glucose reference range applies only to samples taken after fasting for at least 8 hours.   CT ABDOMEN PELVIS W CONTRAST  Result Date: 11/01/2022 CLINICAL DATA:  Intra-abdominal abscess, follow-up examination EXAM: CT ABDOMEN AND PELVIS WITH CONTRAST TECHNIQUE: Multidetector CT imaging of the abdomen and pelvis was performed using the standard protocol following bolus administration of intravenous contrast. RADIATION DOSE REDUCTION: This exam was performed according to the departmental dose-optimization program which includes automated exposure control, adjustment of the mA and/or kV according to patient size and/or use of iterative reconstruction technique. CONTRAST:  131mL OMNIPAQUE IOHEXOL 350 MG/ML SOLN COMPARISON:  10/23/2022 FINDINGS: Lower chest: Small right pleural effusion. Mild bibasilar atelectasis. Cardiac size within normal limits. Hepatobiliary: Vicarious excretion of contrast within the gallbladder which is otherwise unremarkable. Ill-defined hypodensity within the right hepatic lobe adjacent the gallbladder fossa is nonspecific, but is unchanged. No intra or extrahepatic biliary ductal dilation. No enhancing intrahepatic mass. Pancreas: Unremarkable Spleen: Unremarkable Adrenals/Urinary Tract: The adrenal glands are unremarkable. Punctate 2 mm nonobstructing calculus noted within the upper pole the right kidney. The kidneys are otherwise unremarkable. The bladder is largely decompressed. Infiltration is again seen superior to the  bladder dome related to the adjacent inflammatory process, unchanged. Stomach/Bowel: Developing mid small bowel obstruction with a single point of transition identified within the mid abdomen at axial image # 54/3 with fluid distension of the proximal small bowel and decompression of the more distal small bowel and colon. No free intraperitoneal gas. Interval percutaneous drainage of the loculated inflammatory appearing fluid collection within the left adnexa with complete evacuation of the previously noted loculated fluid. There is extensive surrounding inflammatory  change and poorly loculated fluid seen medial to the drainage catheter, unchanged from prior examination. Several small bowel loops adjacent to this inflammatory process demonstrate mild bowel wall thickening, likely reactive. New loculated rim enhancing fluid collection is seen within the right lower quadrant abutting several loops of a distal small bowel measuring 3.2 x 7.4 x 5.0 cm on coronal image # 89/7 and axial image # 68/3. Stable mild free fluid within the right pericolic gutter and within the cul-de-sac. Vascular/Lymphatic: No significant vascular findings are present. No enlarged abdominal or pelvic lymph nodes. Reproductive: As noted above, the loculated fluid collection in keeping with a tubo-ovarian abscess within the left adnexa has been completely evacuated following placement of a left lower quadrant percutaneous drainage catheter. Extensive surrounding inflammatory changes and poorly loculated fluid within the right adnexa persist. Other: Moderate subcutaneous body wall edema. No abdominal wall hernia. Musculoskeletal: No acute bone abnormality. No lytic or blastic bone lesion. IMPRESSION: 1. Interval percutaneous drainage of the loculated inflammatory appearing fluid collection within the left adnexa with complete evacuation of the previously noted loculated fluid. Extensive surrounding inflammatory change and poorly loculated fluid  within the right adnexa persist. 2. New loculated rim enhancing fluid collection within the right lower quadrant abutting several loops of a distal small bowel measuring 3.2 x 7.4 x 5.0 cm. 3. Developing mid small bowel obstruction with a single point of transition identified within the mid abdomen. 4. Minimal right nonobstructing nephrolithiasis. 5. Moderate subcutaneous body wall edema. Electronically Signed   By: Fidela Salisbury M.D.   On: 11/01/2022 23:20   [place] EKG SITE RITE  Result Date: 11/01/2022 If Site Rite image not attached, placement could not be confirmed due to current cardiac rhythm.  DG Abd 1 View  Result Date: 11/01/2022 CLINICAL DATA:  Ileus.  Nausea. EXAM: ABDOMEN - 1 VIEW COMPARISON:  Abdominal x-ray dated October 29, 2022. FINDINGS: Unchanged left-sided pelvic percutaneous pigtail drainage catheter. Unchanged diffusely dilated air-filled small bowel with loops measuring up to 6.6 cm in diameter. Unchanged dilatation of the transverse colon. No acute osseous abnormality. IMPRESSION: 1. Unchanged ileus. Electronically Signed   By: Titus Dubin M.D.   On: 11/01/2022 09:48    Anti-infectives (From admission, onward)    Start     Dose/Rate Route Frequency Ordered Stop   10/30/22 2000  Ampicillin-Sulbactam (UNASYN) 3 g in sodium chloride 0.9 % 100 mL IVPB        3 g 200 mL/hr over 30 Minutes Intravenous Every 6 hours 10/30/22 1530     10/26/22 2200  meropenem (MERREM) 2 g in sodium chloride 0.9 % 100 mL IVPB  Status:  Discontinued        2 g 280 mL/hr over 30 Minutes Intravenous Every 8 hours 10/26/22 1409 10/30/22 1530   10/26/22 1000  fluconazole (DIFLUCAN) tablet 100 mg       See Hyperspace for full Linked Orders Report.   100 mg Oral Daily 10/25/22 1412 10/31/22 1011   10/25/22 2000  doxycycline (VIBRAMYCIN) 100 mg in sodium chloride 0.9 % 250 mL IVPB  Status:  Discontinued        100 mg 125 mL/hr over 120 Minutes Intravenous Every 12 hours 10/25/22 1304 10/30/22 1530    10/25/22 1500  fluconazole (DIFLUCAN) tablet 200 mg       See Hyperspace for full Linked Orders Report.   200 mg Oral Daily 10/25/22 1412 10/25/22 1718   10/25/22 1400  meropenem (MERREM) 2 g in sodium chloride 0.9 %  100 mL IVPB  Status:  Discontinued        2 g 280 mL/hr over 30 Minutes Intravenous Every 12 hours 10/25/22 1304 10/26/22 1409   10/25/22 1200  doxycycline (VIBRA-TABS) tablet 100 mg  Status:  Discontinued        100 mg Oral Every 12 hours 10/25/22 1103 10/25/22 1109   10/24/22 0045  cefTRIAXone (ROCEPHIN) 2 g in sodium chloride 0.9 % 100 mL IVPB  Status:  Discontinued        2 g 200 mL/hr over 30 Minutes Intravenous Daily at bedtime 10/24/22 0025 10/25/22 1102   10/24/22 0045  metroNIDAZOLE (FLAGYL) IVPB 500 mg  Status:  Discontinued        500 mg 100 mL/hr over 60 Minutes Intravenous 2 times daily 10/24/22 0025 10/25/22 1102   10/24/22 0030  doxycycline (VIBRA-TABS) tablet 100 mg  Status:  Discontinued        100 mg Oral Every 12 hours 10/24/22 0025 10/25/22 1102   10/24/22 0000  piperacillin-tazobactam (ZOSYN) IVPB 3.375 g  Status:  Discontinued        3.375 g 100 mL/hr over 30 Minutes Intravenous  Once 10/23/22 2348 10/24/22 0025       Assessment/Plan Left tubo-ovarian abscess s/p IR drain placement 3/26 New RLQ fluid collection concerning for abscess SBO - This is a 52yo female with multiple co-morbidities including Lupus on chronic immunosuppressant, who was admitted to [facility] 3/26 with AMS and found to have a Left tubo-ovarian abscess which was drained by IR 3/26. She had a repeat CT scan today that shows a new 3.2 x 7.4 x 5.0 cm fluid collection in the RLQ. Appendix is not visualized but it would be odd for her to have perforated appendicitis in addition to a left tubo-ovarian abscess. No indication for acute surgical intervention. Agree with asking IR to percutaneously drain this second fluid collection. ID is following and managing her antibiotics.  CT scan today also  concerning for SBO. Recommend NG tube placement for decompression and SBO protocol with gastrograffin and delayed film.  We will follow.  FEN - IVF, NPO VTE - eliquis ID - unasyn, per ID Foley - none  I reviewed nursing notes, ED provider notes, Consultant (IR) notes, hospitalist notes, last 24 h vitals and pain scores, last 48 h intake and output, last 24 h labs and trends, and last 24 h imaging results.  Jillyn Ledger, [facility] 11/02/2022, 2:19 PM Please see Amion for pager number during day hours 7:00am-4:30pm

## 2022-11-02 NOTE — Progress Notes (Signed)
PHARMACY - TOTAL PARENTERAL NUTRITION CONSULT NOTE   Indication: Small bowel obstruction and prolonged inability to tolerate PO intake  Patient Measurements: Height: 5\' 8"  (172.7 cm) Weight: (!) 164 kg (361 lb 8.9 oz) IBW/kg (Calculated) : 63.9 TPN AdjBW (KG): 86.4 Body mass index is 54.97 kg/m.  Assessment:  52 yo female with PMH of Bell's palsy, DM, CHF, hypothyroidism, hx DVT on Eliquis. Recently discharged on 3/21 after admission for abdominal pain and intractable nausea/vomiting. Brought back to ED on 3/24 for AMS and loss of consciousness. Endorsed abdominal pain and nausea/vomiting on day of admission. Pharmacy has been consulted on 4/3 to begin TPN as patient now with evidence of SBO on imaging and prolonged poor PO intake.   Discussed with patient on 4/3, she states that she really has not been able to tolerate a meal since before her previous admission ~3/16. Has a few juice cups and water cup on her tray, but states she is really only able to drink liquids when she is taking meds. Intake causes her to experience nausea and abdominal pain. Patient stated that just thinking about food/liquid/PO intake during our discussion made her feel nauseous.   Glucose / Insulin: hx T2DM, last A1c 7.4% 05/2022, on U-500 100 units TID, metformin, Jardiance, and Ozempic PTA >> was started on Semglee 100 units BID here but reduced down to Semglee 10 units QD on 4/1 for low BG's secondary to poor PO intake. Also on sSSI.  - CBGs <180 - also on D5-LR @75  mL/hr Electrolytes: K 3.2 (low), Mg 1.6 (low), Phos 2.5 (low end of goal) - anticipate further drop with TPN initiation - Goal K >/= 4 and Mag >/= 2 Renal: Scr <1, BUN <5 Hepatic: TG 85, AST slightly low at 12, others wnl Intake / Output: UOP 0.2 mL/kg/hr, +13L this admission, LBM 3/28 MIVF: D5LR 75 mL/hr GI Imaging: - 3/24 CTAP: inflammatory/infectious process such as PID w/ developing abscess, no bowel obstruction - 3/29 Abd X-ray: multiple  dilated air-filled loops of small and large bowel, could represent ileus or developing obstruction - 4/2 CTAP: new loculated rim enhancing fluid collection w/in the RLQ, several loops of distal small bowel, developing mid small bowel obstruction GI Surgeries / Procedures:  - 3/26: CT-guided left pelvic abscess drain placement  Central access: 4/3 TPN start date: 4/3  Nutritional Goals: Goal TPN rate is 90 mL/hr (provides 112 g of protein and 1988 kcals per day)  RD Assessment: Estimated Needs Total Energy Estimated Needs: 1900-2100 kcal/d Total Protein Estimated Needs: 100-120 g/d Total Fluid Estimated Needs: 2L/d  Current Nutrition:  NPO and TPN  Plan:  Start TPN at 24mL/hr at 1800 Electrolytes in TPN: Na 89mEq/L, K 21mEq/L, Ca 65mEq/L, Mg 43mEq/L, and Phos 76mmol/L. Cl:Ac 1:1 Add standard MVI and trace elements to TPN Continue Sensitive q4h SSI and adjust as needed  Continue Semglee 10 units SQ QD for now - can consider adding to TPN as rate is increased to goal once insulin needs determined Reduce MIVF to LR @ 35 mL/hr at 1800 (ok to remove D5 component per d/w MD) Monitor TPN labs on Mon/Thurs, and daily until at goal rate Add thiamine 100mg  to TPN x 5 days for refeeding risk Kphos 23mmol IV x1, Kcl x 4 runs, Mag 4g IV x1  Dimple Nanas, PharmD, BCPS 11/02/2022 7:17 AM

## 2022-11-02 NOTE — Progress Notes (Signed)
[name] NOTE    [name]  [id number] DOB: Jun 28, 1971 DOA: 10/23/2022 PCP: Bartholome Bill, MD   Brief Narrative:  52 year old African-American female with past medical history of morbid obesity with BMI greater than 50, systolic/diastolic CHF with previous ejection fraction of 30 to 35% and grade 1 diastolic dysfunction, chronic right ventricular failure, history of DVT, hypothyroidism, depression and anxiety and chronic abdominal pain who presented to the emergency room on 3/24 with complaints of lightheadedness and abdominal pain.  She was also found to be somewhat confused.  Workup revealed severe sepsis secondary to intra-abdominal abscess concerning for PID/TOA and case discussed with gynecology.  Mass not present on CT on 3/15.  Interventional radiology consulted and patient underwent drain placement 3/26.  Mentation much improved.  Patient had been on a limited diet full liquids due to complaints of abdominal pain.  Started having some nausea and vomiting 3/29.  Abdominal x-ray noted dilated small bowel loops concerning for developing ileus.  Symptoms have persisted and repeat x-ray on 3/30 confirms SBO versus ileus.  Patient has had a prolonged period without eating much, and with persistent ileus, started on PICC line and TPN 4/2.   Repeat CT scan done and showed a newly identified intra-abdominal abscess and so interventional radiology was consulted and they feel that she is a candidate for a repeat drain placement and so she has been made n.p.o. at midnight and her blood thinners are being held.  Plan is for vein placement in the a.m. with Dr. Kathlene Cote.  General surgery was also consulted given her ileus versus a small bowel obstruction so general surgery recommended NG tube placement for decompression of small bowel obstruction protocol with Gastrografin delayed film however patient has refused her NG tube currently and needs some more time to think about having the NG  placed.  Assessment and Plan:  Syncope. Acute metabolic encephalopathy/hypersomnolence-resolved -Initially presented with what appears to be a syncopal event. There was a concern for seizures, however EEG was unremarkable.   -Ammonia level, TSH, HIV, RPR all unremarkable.   -VBG negative for hypercarbia.   -Neurology evaluated and felt findings unrelated to any neurological issue.  Initial concern this could be secondary to pain medication.   -Patient's IV pain medicine was held and she became more appropriate and alert.   -Felt to be multifactorial secondary to decreased clearance from acute kidney injury, additional anesthesia and pain medication from intervention radiology for drain placement and IV pain medicine.   -By 3/28, patient much improved today despite getting continued pain medication is alert and oriented x 3 -Will need orthostatics prior to discharge and PT OT evaluation recommending SNF   Severe sepsis secondary to intra-abdominal abscess in patient with acute on chronic abdominal pain. -Patient met criteria for severe sepsis given fever, leukocytosis,  -AKI creatinine greater than 2 and abdominal source.   -CT abdomen shows evidence of a 5 x 5 cm fluid collection in the left hemipelvis concerning for developing abscess.  GYN was consulted.  Discussed with Dr. Roselie Awkward. Patient has not had sex in 4 years and the mass was not present on CT 3/15.   -GYN does not think that the patient is suffering from a tubo-ovarian abscess.   -Patient underwent IR guided drain placement on 3/26.  -Following drain placement and antibiotic adjustment, white blood cell count and procalcitonin initially improved and started to trend back upward.   -Now with minimal output.  Intervention radiology following.   -Repeat CT Scan done  and showed "Interval percutaneous drainage of the loculated inflammatory appearing fluid collection within the left adnexa with complete evacuation of the previously noted  loculated fluid. Extensive surrounding inflammatory change and poorly loculated fluid within the right adnexa persist. New loculated rim enhancing fluid collection within the right lower quadrant abutting several loops of a distal small bowel measuring 3.2 x 7.4 x 5.0 cm. Developing mid small bowel obstruction with a single point of transition identified within the mid abdomen. Minimal right nonobstructing nephrolithiasis. Moderate subcutaneous body wall edema." -WBC Trend:  Recent Labs  Lab 10/28/22 0728 10/29/22 1037 10/30/22 1127 10/31/22 0426 11/01/22 0532 11/01/22 1146 11/02/22 1053  WBC 13.1* 22.2* 23.3* 22.7* 22.2* 22.3* 18.3*  -Infectious Disease has been consulted and following -Given new abscess and a developing mid small bowel obstruction have consulted general surgery as well as interventional radiology and the plan is for another drain placement in her new abscess tomorrow   UTI -Patient's white blood cell count initially improved following antibiotics and drain placement, down to 13 on 3/29.  -However, since then has been steadily rising, currently at 23.  Urine not very impressive.   -Negative cultures.  Discussed with infectious disease and they feel white count more likely related to SBO/ileus.   -See Above   Persistent ileus vs SBO -Persisting.   -Treating underlying infection and electrolyte abnormalities.  Ice chips.   -Will have PT see to increase ambulation.   -Have added TPN due to prolonged period without nutrition.  -Rechecking abdominal CT for follow-up on abscess and as above -Now she has a developing mid small bowel obstruction with a single point of transition identified within the mid abdomen. -General surgery has been consulted and agree with IR to percutaneously drain the second fluid collection and recommending NG tube placement for decompression and small bowel protocol with Gastrografin delayed film however patient refused NG tube placement currently    AKI, resolved. -Suspect ATN in the setting of sepsis, possibility of contrast-induced injury as well as prerenal etiology in the setting of poor p.o. intake.   -BUN/Cr Trend: Recent Labs  Lab 10/27/22 0724 10/28/22 0728 10/29/22 1037 10/30/22 1127 10/31/22 0426 11/01/22 0532 11/02/22 1053  BUN 22* 16 12 9 10 7  <5*  CREATININE 1.29* 0.93 0.83 0.76 0.76 0.80 0.73  -Improved with IVF and now getting LR at 35 mL/hr as patient is on TPN -Continue to Monitor for S/Sx of Volume Overload  -Avoid Nephrotoxic Medications, Contrast Dyes, Hypotension and Dehydration to Ensure Adequate Renal Perfusion and will need to Renally Adjust Meds -Continue to Monitor and Trend Renal Function carefully and repeat CMP in the AM  -Monitor closely due to history of CHF.  Much improved by 3/28   Hypersomnolence. OHS. Obesity class III. -Complicates overall prognosis and care and patient at high risk for poor outome -Estimated body mass index is 54.97 kg/m as calculated from the following:   Height as of this encounter: 5\' 8"  (1.727 m).   Weight as of this encounter: 164 kg.  -Weight Loss and Dietary Counseling given -Patient was recommended to have an outpatient sleep evaluation during her recent hospitalization.   Chronic Combined Systolic and Diastolic CHF -Echocardiogram done during this hospitalization notes much improvement in ejection fraction and indeterminate diastolic function.   -BNP within normal limits -Continue to Monitor for S/Sx of Volume Overload -Currently appears euvolemic   Gastroparesis. -Patient is not on any medication for gastroparesis.   -Was Tolerating p.o. but still reporting some nausea and  vomiting.  -Intolerant of Reglan.   -Abdominal x-ray notes multiple dilated air-filled loops and repeat CT scan as above.   -Will replace electrolytes accordingly. -Currently on Zofran. -Will resume home scopolamine patch.  Hypokalemia -Patient's K+ Level Trend: Recent Labs  Lab  10/27/22 0724 10/28/22 0728 10/29/22 1037 10/30/22 1127 10/31/22 0426 11/01/22 0532 11/02/22 1053  K 3.5 3.4* 3.7 5.1 3.5 3.6 3.2*  -Replete with IV 40 mEQ  -Continue to Monitor and Replete as Necessary -Repeat CMP in the AM   Hypomagnesemia -Patient's Mag Level Trend: Recent Labs  Lab 10/24/22 0643 11/01/22 0532 11/02/22 1053  MG 1.7 1.7 1.6*  -Replete with IV Mag Sulfate 4 grams -Continue to Monitor and Replete as Necessary -Repeat Mag in the AM    Severe Protein Calorie Malnutrition -Nutrition Status: Nutrition Problem: Inadequate oral intake Etiology: poor appetite, nausea Signs/Symptoms: meal completion < 25%, NPO status, per patient/family report Interventions: Refer to RD note for recommendations   History of PE. -Had a PE last October, told that she needs to be on Eliquis indefinitely.  Currently being on Eliquis   Type 2 diabetes mellitus, uncontrolled with hyperglycemia with gastroparesis. -With long-term insulin use. -Patient is actually on U-500 insulin at home. -Now with SBO/ileus, decreased long-acting insulin down to 10 units only plus Q for her sliding scale. -Continue monitor CBGs per protocol   Lupus and Sjogren's Syndrome. -Patient has on hydroxychloroquine, belimumab as well as Imuran which have all been held -Patient remains at risk for infections. -Continue Monitor for now.   Anxiety and depression. -Continue home regimen and C/w Quetiapine 200 mg po daily    Hypothyroidism. -Continue Levothyroxine 50 mcg po Daily .   Overactive bladder. -On Ditropan, currently held   GERD. -Continue PPI with Pantoprazole 40 mg po Daily but will change to IV now   Diabetic neuropathy. -Has been on Lyrica. -Due to encephalopathy we will hold it for now.  Restart slowly   Hyperlipidemia. -Holding Crestor while NPO with meds and sips and on TPN with ? SBO   Oral candidiasis. -Will need to follow and see if she was treated with Diflucan.    Hypoalbuminemia -Patient's Albumin Trend: Recent Labs  Lab 10/17/22 0412 10/21/22 1648 10/23/22 2110 10/26/22 0406 10/27/22 0724 10/28/22 0728 11/02/22 1053  ALBUMIN 2.8* 2.9* 2.9* 1.9* 2.0* 1.7* 1.7*  -Continue to Monitor and Trend and repeat CMP in the AM  Obesity -Complicates overall prognosis and care -Estimated body mass index is 54.97 kg/m as calculated from the following:   Height as of this encounter: 5\' 8"  (1.727 m).   Weight as of this encounter: 164 kg.  -Weight Loss and Dietary Counseling given  DVT prophylaxis: apixaban (ELIQUIS) tablet 2.5 mg Start: 10/25/22 2200 Place and maintain sequential compression device Start: 10/25/22 1103 apixaban (ELIQUIS) tablet 2.5 mg    Code Status: Full Code Family Communication: Discussed with Sister Teasha over the telephone  Disposition Plan:  Level of care: Telemetry Medical Status is: Inpatient Remains inpatient appropriate because: Needs further clinical improvement   Consultants:  Infectious Diseases General Surgery Interventional Radiology   Procedures:  As delineated as above  Antimicrobials:  Anti-infectives (From admission, onward)    Start     Dose/Rate Route Frequency Ordered Stop   10/30/22 2000  Ampicillin-Sulbactam (UNASYN) 3 g in sodium chloride 0.9 % 100 mL IVPB        3 g 200 mL/hr over 30 Minutes Intravenous Every 6 hours 10/30/22 1530     10/26/22  2200  meropenem (MERREM) 2 g in sodium chloride 0.9 % 100 mL IVPB  Status:  Discontinued        2 g 280 mL/hr over 30 Minutes Intravenous Every 8 hours 10/26/22 1409 10/30/22 1530   10/26/22 1000  fluconazole (DIFLUCAN) tablet 100 mg       See Hyperspace for full Linked Orders Report.   100 mg Oral Daily 10/25/22 1412 10/31/22 1011   10/25/22 2000  doxycycline (VIBRAMYCIN) 100 mg in sodium chloride 0.9 % 250 mL IVPB  Status:  Discontinued        100 mg 125 mL/hr over 120 Minutes Intravenous Every 12 hours 10/25/22 1304 10/30/22 1530   10/25/22 1500   fluconazole (DIFLUCAN) tablet 200 mg       See Hyperspace for full Linked Orders Report.   200 mg Oral Daily 10/25/22 1412 10/25/22 1718   10/25/22 1400  meropenem (MERREM) 2 g in sodium chloride 0.9 % 100 mL IVPB  Status:  Discontinued        2 g 280 mL/hr over 30 Minutes Intravenous Every 12 hours 10/25/22 1304 10/26/22 1409   10/25/22 1200  doxycycline (VIBRA-TABS) tablet 100 mg  Status:  Discontinued        100 mg Oral Every 12 hours 10/25/22 1103 10/25/22 1109   10/24/22 0045  cefTRIAXone (ROCEPHIN) 2 g in sodium chloride 0.9 % 100 mL IVPB  Status:  Discontinued        2 g 200 mL/hr over 30 Minutes Intravenous Daily at bedtime 10/24/22 0025 10/25/22 1102   10/24/22 0045  metroNIDAZOLE (FLAGYL) IVPB 500 mg  Status:  Discontinued        500 mg 100 mL/hr over 60 Minutes Intravenous 2 times daily 10/24/22 0025 10/25/22 1102   10/24/22 0030  doxycycline (VIBRA-TABS) tablet 100 mg  Status:  Discontinued        100 mg Oral Every 12 hours 10/24/22 0025 10/25/22 1102   10/24/22 0000  piperacillin-tazobactam (ZOSYN) IVPB 3.375 g  Status:  Discontinued        3.375 g 100 mL/hr over 30 Minutes Intravenous  Once 10/23/22 2348 10/24/22 0025       Subjective: Seen and examined at bedside and was still having some abdominal discomfort and pain.  Did not feel as well.  No nausea or vomiting.  States she had a repeat scan last night.  No other concerns or complaints at this time.  Objective: Vitals:   11/02/22 0500 11/02/22 0800 11/02/22 1128 11/02/22 1648  BP:  132/61 132/67 (!) 168/70  Pulse:  91 87 88  Resp:  17 18 18   Temp:  98.4 F (36.9 C) 98.1 F (36.7 C) 98.2 F (36.8 C)  TempSrc:  Oral Oral Oral  SpO2:  100% 100% 100%  Weight: (!) 164 kg     Height:        Intake/Output Summary (Last 24 hours) at 11/02/2022 1708 Last data filed at 11/02/2022 0600 Gross per 24 hour  Intake 2471.78 ml  Output 500 ml  Net 1971.78 ml   Filed Weights   10/31/22 0500 11/01/22 0600 11/02/22 0500   Weight: (!) 164 kg (!) 165.4 kg (!) 164 kg   Examination: Physical Exam:  Constitutional: WN/WD super morbidly obese African-American female who appears a little uncomfortable Respiratory: Diminished to auscultation bilaterally, no wheezing, rales, rhonchi or crackles. Normal respiratory effort and patient is not tachypenic. No accessory muscle use.  Unlabored breathing Cardiovascular: RRR, no murmurs / rubs /  gallops. S1 and S2 auscultated.  No appreciable extremity edema Abdomen: Soft, tender to palpate with distended body habitus and has an abdominal drain on the left side. Bowel sounds are diminished.  GU: Deferred. Musculoskeletal: No clubbing / cyanosis of digits/nails. No joint deformity upper and lower extremities.  Skin: No rashes, lesions, ulcers on a limited skin evaluation. No induration; Warm and dry.  Neurologic: CN 2-12 grossly intact with no focal deficits. Romberg sign and cerebellar reflexes not assessed.  Psychiatric: Normal judgment and insight. Alert and oriented x 3. Anxious mood and appropriate affect.   Data Reviewed: I have personally reviewed following labs and imaging studies  CBC: Recent Labs  Lab 10/30/22 1127 10/31/22 0426 11/01/22 0532 11/01/22 1146 11/02/22 1053  WBC 23.3* 22.7* 22.2* 22.3* 18.3*  NEUTROABS  --   --   --  15.7*  --   HGB 8.7* 8.4* 7.9* 8.0* 7.9*  HCT 24.0* 24.2* 22.9* 23.3* 22.9*  MCV 82.8 85.2 84.5 85.0 84.5  PLT 395 418* 442* 456* [id number]*   Basic Metabolic Panel: Recent Labs  Lab 10/29/22 1037 10/30/22 1127 10/31/22 0426 11/01/22 0532 11/02/22 1053  NA 137 136 132* 137 138  K 3.7 5.1 3.5 3.6 3.2*  CL 108 107 106 106 105  CO2 19* 19* 20* 23 23  GLUCOSE 107* 152* 162* 130* 130*  BUN 12 9 10 7  <5*  CREATININE 0.83 0.76 0.76 0.80 0.73  CALCIUM 8.5* 8.5* 8.0* 8.4* 8.5*  MG  --   --   --  1.7 1.6*  PHOS  --   --   --   --  2.5   GFR: Estimated Creatinine Clearance: 134.9 mL/min (by C-G formula based on SCr of 0.73  mg/dL). Liver Function Tests: Recent Labs  Lab 10/27/22 0724 10/28/22 0728 11/02/22 1053  AST 15 16 12*  ALT 13 12 14   ALKPHOS 124 112 111  BILITOT 1.2 1.2 0.8  PROT 6.0* 5.4* 5.8*  ALBUMIN 2.0* 1.7* 1.7*   No results for input(s): "LIPASE", "AMYLASE" in the last 168 hours. No results for input(s): "AMMONIA" in the last 168 hours. Coagulation Profile: No results for input(s): "INR", "PROTIME" in the last 168 hours. Cardiac Enzymes: No results for input(s): "CKTOTAL", "CKMB", "CKMBINDEX", "TROPONINI" in the last 168 hours. BNP (last 3 results) No results for input(s): "PROBNP" in the last 8760 hours. HbA1C: No results for input(s): "HGBA1C" in the last 72 hours. CBG: Recent Labs  Lab 11/01/22 2308 11/02/22 0330 11/02/22 0827 11/02/22 1241 11/02/22 1648  GLUCAP 111* 121* 120* 132* 137*   Lipid Profile: Recent Labs    11/02/22 1051  TRIG 85   Thyroid Function Tests: No results for input(s): "TSH", "T4TOTAL", "FREET4", "T3FREE", "THYROIDAB" in the last 72 hours. Anemia Panel: No results for input(s): "VITAMINB12", "FOLATE", "FERRITIN", "TIBC", "IRON", "RETICCTPCT" in the last 72 hours. Sepsis Labs: Recent Labs  Lab 10/30/22 1450 10/31/22 0426 11/01/22 0532 11/02/22 0500  PROCALCITON 0.68 0.45 0.32 0.27    Recent Results (from the past 240 hour(s))  Wet prep, genital     Status: None   Collection Time: 10/23/22 11:20 PM  Result Value Ref Range Status   Yeast Wet Prep HPF POC NONE SEEN NONE SEEN Final   Trich, Wet Prep NONE SEEN NONE SEEN Final    Comment: Specimen diluted due to transport tube containing more than 1 ml of saline, interpret results with caution.   Clue Cells Wet Prep HPF POC NONE SEEN NONE SEEN Final  WBC, Wet Prep HPF POC <10 <10 Final   Sperm NONE SEEN  Final    Comment: Performed at [facility], [place] [address], [place], [place] [address]  Aerobic/Anaerobic Culture w Gram Stain (surgical/deep wound)     Status: None   Collection  Time: 10/25/22  1:40 PM   Specimen: Abscess  Result Value Ref Range Status   Specimen Description ABSCESS  Final   Special Requests NONE  Final   Gram Stain   Final    ABUNDANT WBC PRESENT, PREDOMINANTLY PMN ABUNDANT GRAM NEGATIVE RODS ABUNDANT GRAM POSITIVE COCCI IN PAIRS IN CHAINS    Culture   Final    ABUNDANT STREPTOCOCCUS ANGINOSIS GROUP B STREP(S.AGALACTIAE)ISOLATED TESTING AGAINST S. AGALACTIAE NOT ROUTINELY PERFORMED DUE TO PREDICTABILITY OF AMP/PEN/VAN SUSCEPTIBILITY. ABUNDANT PREVOTELLA BIVIA BETA LACTAMASE POSITIVE Performed at [facility], [place] [address], [place], [place] [address]    Report Status 10/29/2022 FINAL  Final   Organism ID, Bacteria STREPTOCOCCUS ANGINOSIS  Final      Susceptibility   Streptococcus anginosis - MIC*    PENICILLIN <=0.06 SENSITIVE Sensitive     CEFTRIAXONE <=0.12 SENSITIVE Sensitive     ERYTHROMYCIN 2 RESISTANT Resistant     LEVOFLOXACIN 0.5 SENSITIVE Sensitive     VANCOMYCIN 0.5 SENSITIVE Sensitive     * ABUNDANT STREPTOCOCCUS ANGINOSIS  Culture, blood (Routine X 2) w Reflex to ID Panel     Status: None   Collection Time: 10/25/22  2:47 PM   Specimen: BLOOD RIGHT ARM  Result Value Ref Range Status   Specimen Description BLOOD RIGHT ARM  Final   Special Requests   Final    BOTTLES DRAWN AEROBIC AND ANAEROBIC Blood Culture adequate volume   Culture   Final    NO GROWTH 5 DAYS Performed at [facility], [address] [place], [place] [address]    Report Status 10/30/2022 FINAL  Final  Culture, blood (Routine X 2) w Reflex to ID Panel     Status: None   Collection Time: 10/25/22  2:47 PM   Specimen: BLOOD RIGHT ARM  Result Value Ref Range Status   Specimen Description BLOOD RIGHT ARM  Final   Special Requests   Final    BOTTLES DRAWN AEROBIC AND ANAEROBIC Blood Culture results may not be optimal due to an inadequate volume of blood received in culture bottles   Culture   Final    NO GROWTH 5 DAYS Performed at [facility], [place] [address] [place], [place] [address]    Report Status 10/30/2022 FINAL  Final  Urine Culture     Status: Abnormal   Collection Time: 10/30/22  3:00 AM   Specimen: Urine, Random  Result Value Ref Range Status   Specimen Description URINE, RANDOM  Final   Special Requests NONE Reflexed from [id number]  Final   Culture (A)  Final    <10,000 COLONIES/mL INSIGNIFICANT GROWTH Performed at [facility], [place] [address] [place], [place] [address]    Report Status 10/31/2022 FINAL  Final    Radiology Studies: CT ABDOMEN PELVIS W CONTRAST  Result Date: 11/01/2022 CLINICAL DATA:  Intra-abdominal abscess, follow-up examination EXAM: CT ABDOMEN AND PELVIS WITH CONTRAST TECHNIQUE: Multidetector CT imaging of the abdomen and pelvis was performed using the standard protocol following bolus administration of intravenous contrast. RADIATION DOSE REDUCTION: This exam was performed according to the departmental dose-optimization program which includes automated exposure control, adjustment of the mA and/or kV  according to patient size and/or use of iterative reconstruction technique. CONTRAST:  165mL OMNIPAQUE IOHEXOL 350 MG/ML SOLN COMPARISON:  10/23/2022 FINDINGS: Lower chest: Small right pleural effusion. Mild bibasilar atelectasis. Cardiac size within normal limits. Hepatobiliary: Vicarious excretion of contrast within the gallbladder which is otherwise unremarkable. Ill-defined hypodensity within the right hepatic lobe adjacent the gallbladder fossa is nonspecific, but is unchanged. No intra or extrahepatic biliary ductal dilation. No enhancing intrahepatic mass. Pancreas: Unremarkable Spleen: Unremarkable Adrenals/Urinary Tract: The adrenal glands are unremarkable. Punctate 2 mm nonobstructing calculus noted within the upper pole the right kidney. The kidneys are otherwise unremarkable. The bladder is largely decompressed. Infiltration is again seen superior to the bladder dome related to  the adjacent inflammatory process, unchanged. Stomach/Bowel: Developing mid small bowel obstruction with a single point of transition identified within the mid abdomen at axial image # 54/3 with fluid distension of the proximal small bowel and decompression of the more distal small bowel and colon. No free intraperitoneal gas. Interval percutaneous drainage of the loculated inflammatory appearing fluid collection within the left adnexa with complete evacuation of the previously noted loculated fluid. There is extensive surrounding inflammatory change and poorly loculated fluid seen medial to the drainage catheter, unchanged from prior examination. Several small bowel loops adjacent to this inflammatory process demonstrate mild bowel wall thickening, likely reactive. New loculated rim enhancing fluid collection is seen within the right lower quadrant abutting several loops of a distal small bowel measuring 3.2 x 7.4 x 5.0 cm on coronal image # 89/7 and axial image # 68/3. Stable mild free fluid within the right pericolic gutter and within the cul-de-sac. Vascular/Lymphatic: No significant vascular findings are present. No enlarged abdominal or pelvic lymph nodes. Reproductive: As noted above, the loculated fluid collection in keeping with a tubo-ovarian abscess within the left adnexa has been completely evacuated following placement of a left lower quadrant percutaneous drainage catheter. Extensive surrounding inflammatory changes and poorly loculated fluid within the right adnexa persist. Other: Moderate subcutaneous body wall edema. No abdominal wall hernia. Musculoskeletal: No acute bone abnormality. No lytic or blastic bone lesion. IMPRESSION: 1. Interval percutaneous drainage of the loculated inflammatory appearing fluid collection within the left adnexa with complete evacuation of the previously noted loculated fluid. Extensive surrounding inflammatory change and poorly loculated fluid within the right adnexa  persist. 2. New loculated rim enhancing fluid collection within the right lower quadrant abutting several loops of a distal small bowel measuring 3.2 x 7.4 x 5.0 cm. 3. Developing mid small bowel obstruction with a single point of transition identified within the mid abdomen. 4. Minimal right nonobstructing nephrolithiasis. 5. Moderate subcutaneous body wall edema. Electronically Signed   By: Fidela Salisbury M.D.   On: 11/01/2022 23:20   [place] EKG SITE RITE  Result Date: 11/01/2022 If Site Rite image not attached, placement could not be confirmed due to current cardiac rhythm.  DG Abd 1 View  Result Date: 11/01/2022 CLINICAL DATA:  Ileus.  Nausea. EXAM: ABDOMEN - 1 VIEW COMPARISON:  Abdominal x-ray dated October 29, 2022. FINDINGS: Unchanged left-sided pelvic percutaneous pigtail drainage catheter. Unchanged diffusely dilated air-filled small bowel with loops measuring up to 6.6 cm in diameter. Unchanged dilatation of the transverse colon. No acute osseous abnormality. IMPRESSION: 1. Unchanged ileus. Electronically Signed   By: Titus Dubin M.D.   On: 11/01/2022 09:48    Scheduled Meds:  apixaban  2.5 mg Oral BID   carvedilol  3.125 mg Oral BID WC   Chlorhexidine Gluconate Cloth  6 each Topical Daily   diatrizoate meglumine-sodium  90 mL Per NG tube Once   insulin aspart  0-9 Units Subcutaneous Q4H   insulin glargine-yfgn  10 Units Subcutaneous Daily   levothyroxine  50 mcg Oral Q0600   pantoprazole  40 mg Oral Daily   QUEtiapine  200 mg Oral QHS   sodium chloride flush  3 mL Intravenous Q12H   sodium chloride flush  5 mL Intracatheter Q8H   Continuous Infusions:  ampicillin-sulbactam (UNASYN) IV 3 g (11/02/22 1325)   dextrose 5 % and 0.9% NaCl 75 mL/hr at 11/02/22 1117   lactated ringers     potassium chloride 10 mEq (11/02/22 1653)   potassium PHOSPHATE IVPB (in mmol) 15 mmol (11/02/22 1409)   TPN ADULT (ION)      LOS: 9 days   Raiford Noble, DO [facility] Available via Epic  secure chat 7am-7pm After these hours, please refer to coverage provider listed on [email or web address] 11/02/2022, 5:08 PM

## 2022-11-02 NOTE — Progress Notes (Signed)
Referring Physician(s): Dr. Berle Mull  Supervising Physician: Corrie Mckusick  Patient Status:  [facility] - In-pt  Chief Complaint: Intra-abdominal fluid collection  Subjective: Lying in bed.  LLQ drain remains in place. Remains on IV therapy.  Aware of CT findings.   Allergies: Metoclopramide, Codeine, Hydrocodone, and Percocet [oxycodone-acetaminophen]  Medications: Prior to Admission medications   Medication Sig Start Date End Date Taking? Authorizing Provider  ADVAIR HFA 230-21 MCG/ACT inhaler Inhale 2 puffs into the lungs 2 (two) times daily. 12/20/20   [provider]  albuterol (PROVENTIL) (2.5 MG/3ML) 0.083% nebulizer solution Take 2.5 mg by nebulization as needed for wheezing.    [provider]  ALPRAZolam Duanne Moron) 0.5 MG tablet Take 0.5 mg by mouth as needed for anxiety or sleep. 04/19/17   [provider]  apixaban (ELIQUIS) 2.5 MG TABS tablet Take 1 tablet (2.5 mg total) by mouth 2 (two) times daily. 09/21/22   Adrian Prows, MD  ARIPiprazole (ABILIFY) 5 MG tablet  01/14/21   [provider]  Azathioprine 75 MG TABS Take 150 mg by mouth 2 (two) times daily. 02/04/21   [provider]  B-D UF III MINI PEN NEEDLES 31G X 5 MM MISC USE AS DIRECTED TWICE A DAY 10/31/18   Renato Shin, MD  Belimumab (BENLYSTA) 200 MG/ML SOAJ Inject 200 mg into the skin once a week. Thursdays 12/19/19   [provider]  buPROPion (WELLBUTRIN XL) 300 MG 24 hr tablet Take 300 mg by mouth daily. 03/14/22   [provider]  carvedilol (COREG) 25 MG tablet Take 1.5 tablets (37.5 mg total) by mouth 2 (two) times daily with a meal. 01/01/21   Adrian Prows, MD  Continuous Blood Gluc Sensor (FREESTYLE LIBRE 3 SENSOR) MISC 1 Device by Does not apply route every 14 (fourteen) days. Apply 1 sensor on upper arm every 14 days for continuous glucose monitoring 05/25/22   Elayne Snare, MD  cromolyn (OPTICROM) 4 % ophthalmic solution Place 1 drop into both eyes 2  (two) times daily. 01/14/21   [provider]  cycloSPORINE (RESTASIS) 0.05 % ophthalmic emulsion Place 1 drop into both eyes in the morning, at noon, and at bedtime.     [provider]  diclofenac Sodium (VOLTAREN) 1 % GEL APPLY 2 GRAMS TO AFFECTED AREA 4 TIMES A DAY Patient taking differently: Apply 2 g topically 4 (four) times daily as needed (pain). 01/11/22   Cantwell, Celeste C, PA-C  EASY TOUCH PEN NEEDLES 32G X 4 MM MISC USE TO INJECT INSULIN TWICE DAILY 08/07/19   Renato Shin, MD  famotidine (PEPCID) 20 MG tablet Take 20 mg by mouth 2 (two) times daily.    [provider]  fluticasone (FLONASE) 50 MCG/ACT nasal spray Place 2 sprays into both nostrils continuous as needed for allergies or rhinitis.    [provider]  glucose blood (ACCU-CHEK GUIDE) test strip 1 each by Other route 2 (two) times daily. And lancets 2/day 06/18/21   Renato Shin, MD  glucose blood [facility] ULTRA) test strip Test blood sugar 4 times a day 03/30/22   Elayne Snare, MD  hydroxychloroquine (PLAQUENIL) 200 MG tablet Take 200 mg by mouth 2 (two) times daily.    [provider]  ibuprofen (ADVIL) 600 MG tablet Take 1 tablet (600 mg total) by mouth every 6 (six) hours as needed. Patient taking differently: Take 600 mg by mouth every 6 (six) hours as needed for mild pain. 10/14/22   Montine Circle, PA-C  insulin regular human CONCENTRATED (HUMULIN R U-500 KWIKPEN) 500 UNIT/ML KwikPen 100 UNITS, 30 MINUTES BEFORE EACH MEAL Patient taking differently: 100 Units 3 (three) times daily with meals. 100 Units, 30 minutes before each meal 08/16/22   Elayne Snare, MD  Insulin Syringe-Needle U-100 (EASY TOUCH INSULIN SYRINGE) 31G X 5/16" 1 ML MISC USE AS DIRECTED THREE TIMES A DAY 10/12/18   Renato Shin, MD  JARDIANCE 10 MG TABS tablet TAKE 1 TABLET (10 MG TOTAL) BY MOUTH DAILY WITH BREAKFAST. 08/18/22   Elayne Snare, MD  Lancets [facility] ULTRASOFT) lancets Test blood sugar 4 times a day  03/30/22   Elayne Snare, MD  levothyroxine (SYNTHROID) 50 MCG tablet Take 1 tablet (50 mcg total) by mouth daily at 6 (six) AM. 08/30/19   Raiford Noble Latif, DO  megestrol (MEGACE) 40 MG tablet Take 40 mg by mouth daily. 01/30/15   [provider]  metFORMIN (GLUCOPHAGE-XR) 500 MG 24 hr tablet Take 2 tablets (1,000 mg total) by mouth 2 (two) times daily. 02/02/21   Renato Shin, MD  Multiple Vitamin (MULTIVITAMIN WITH MINERALS) TABS tablet Take 1 tablet by mouth daily. 08/30/19   Raiford Noble Latif, DO  ondansetron (ZOFRAN-ODT) 4 MG disintegrating tablet Take 1 tablet (4 mg total) by mouth every 8 (eight) hours as needed for nausea or vomiting. 10/14/22   Montine Circle, PA-C  oxybutynin (DITROPAN-XL) 5 MG 24 hr tablet Take 5 mg by mouth daily. 04/25/22   [provider]  pregabalin (LYRICA) 75 MG capsule Take 75 mg 3 (three) times daily by mouth.  01/28/15   [provider]  prochlorperazine (COMPAZINE) 10 MG tablet Take 1 tablet (10 mg total) by mouth every 6 (six) hours as needed for nausea or vomiting. 05/14/22   Bonnielee Haff, MD  promethazine (PHENERGAN) 25 MG suppository Place 1 suppository (25 mg total) rectally every 6 (six) hours as needed for nausea or vomiting. 10/21/22   Clark, Meghan R, PA  QUEtiapine (SEROQUEL) 300 MG tablet Take 300 mg by mouth at bedtime. 01/08/21   [provider]  rosuvastatin (CRESTOR) 20 MG tablet Take 1 tablet (20 mg total) by mouth daily. 06/09/21 10/16/22  Cantwell, Celeste C, PA-C  Semaglutide, 2 MG/DOSE, 8 MG/3ML SOPN Inject 2 mg as directed once a week. 08/18/22   Elayne Snare, MD  sucralfate (CARAFATE) 1 GM/10ML suspension Take 10 mLs by mouth 2 (two) times daily as needed (abdomen pain). 12/09/20   [provider]  topiramate (TOPAMAX) 25 MG tablet Take 75 mg by mouth at bedtime. 03/17/16   [provider]  torsemide (DEMADEX) 20 MG tablet Take 1 tablet (20 mg total) by mouth at bedtime. 06/09/21   Cantwell,  Celeste C, PA-C  TRANSDERM-SCOP 1 MG/3DAYS Place 1 patch (1.5 mg total) onto the skin every 3 (three) days. As needed for nausea 10/21/22   Theressa Stamps R, PA  Vitamin D, Ergocalciferol, (DRISDOL) 1.25 MG (50000 UNIT) CAPS capsule Take 50,000 Units by mouth once a week. 09/20/22   [provider]     Vital Signs: BP 132/67 (BP Location: Left Leg)   Pulse 87   Temp 98.1 F (36.7 C) (Oral)   Resp 18   Ht 5\' 8"  (1.727 m)   Wt (!) 361 lb 8.9 oz (164 kg)   LMP 03/24/2017   SpO2 100%   BMI 54.97 kg/m   Physical Exam Vitals and nursing note reviewed.  Constitutional:      General: She is not in acute distress.  Appearance: Normal appearance. She is not ill-appearing.  HENT:     Mouth/Throat:     Mouth: Mucous membranes are moist.     Pharynx: Oropharynx is clear.  Cardiovascular:     Rate and Rhythm: Normal rate and regular rhythm.  Pulmonary:     Effort: Pulmonary effort is normal.     Breath sounds: Normal breath sounds.  Abdominal:     Comments: LLQ drain in place.  Insertion site stable.  Purulent-appearing, thick drainage in gravity bag.  Flushes and aspirates easily.   Neurological:     General: No focal deficit present.     Mental Status: She is alert and oriented to person, place, and time.  Psychiatric:        Mood and Affect: Mood normal.        Behavior: Behavior normal.        Thought Content: Thought content normal.        Judgment: Judgment normal.     Imaging: CT ABDOMEN PELVIS W CONTRAST  Result Date: 11/01/2022 CLINICAL DATA:  Intra-abdominal abscess, follow-up examination EXAM: CT ABDOMEN AND PELVIS WITH CONTRAST TECHNIQUE: Multidetector CT imaging of the abdomen and pelvis was performed using the standard protocol following bolus administration of intravenous contrast. RADIATION DOSE REDUCTION: This exam was performed according to the departmental dose-optimization program which includes automated exposure control, adjustment of the mA and/or kV  according to patient size and/or use of iterative reconstruction technique. CONTRAST:  148mL OMNIPAQUE IOHEXOL 350 MG/ML SOLN COMPARISON:  10/23/2022 FINDINGS: Lower chest: Small right pleural effusion. Mild bibasilar atelectasis. Cardiac size within normal limits. Hepatobiliary: Vicarious excretion of contrast within the gallbladder which is otherwise unremarkable. Ill-defined hypodensity within the right hepatic lobe adjacent the gallbladder fossa is nonspecific, but is unchanged. No intra or extrahepatic biliary ductal dilation. No enhancing intrahepatic mass. Pancreas: Unremarkable Spleen: Unremarkable Adrenals/Urinary Tract: The adrenal glands are unremarkable. Punctate 2 mm nonobstructing calculus noted within the upper pole the right kidney. The kidneys are otherwise unremarkable. The bladder is largely decompressed. Infiltration is again seen superior to the bladder dome related to the adjacent inflammatory process, unchanged. Stomach/Bowel: Developing mid small bowel obstruction with a single point of transition identified within the mid abdomen at axial image # 54/3 with fluid distension of the proximal small bowel and decompression of the more distal small bowel and colon. No free intraperitoneal gas. Interval percutaneous drainage of the loculated inflammatory appearing fluid collection within the left adnexa with complete evacuation of the previously noted loculated fluid. There is extensive surrounding inflammatory change and poorly loculated fluid seen medial to the drainage catheter, unchanged from prior examination. Several small bowel loops adjacent to this inflammatory process demonstrate mild bowel wall thickening, likely reactive. New loculated rim enhancing fluid collection is seen within the right lower quadrant abutting several loops of a distal small bowel measuring 3.2 x 7.4 x 5.0 cm on coronal image # 89/7 and axial image # 68/3. Stable mild free fluid within the right pericolic gutter and  within the cul-de-sac. Vascular/Lymphatic: No significant vascular findings are present. No enlarged abdominal or pelvic lymph nodes. Reproductive: As noted above, the loculated fluid collection in keeping with a tubo-ovarian abscess within the left adnexa has been completely evacuated following placement of a left lower quadrant percutaneous drainage catheter. Extensive surrounding inflammatory changes and poorly loculated fluid within the right adnexa persist. Other: Moderate subcutaneous body wall edema. No abdominal wall hernia. Musculoskeletal: No acute bone abnormality. No lytic or blastic bone lesion.  IMPRESSION: 1. Interval percutaneous drainage of the loculated inflammatory appearing fluid collection within the left adnexa with complete evacuation of the previously noted loculated fluid. Extensive surrounding inflammatory change and poorly loculated fluid within the right adnexa persist. 2. New loculated rim enhancing fluid collection within the right lower quadrant abutting several loops of a distal small bowel measuring 3.2 x 7.4 x 5.0 cm. 3. Developing mid small bowel obstruction with a single point of transition identified within the mid abdomen. 4. Minimal right nonobstructing nephrolithiasis. 5. Moderate subcutaneous body wall edema. Electronically Signed   By: Fidela Salisbury M.D.   On: 11/01/2022 23:20   [place] EKG SITE RITE  Result Date: 11/01/2022 If Site Rite image not attached, placement could not be confirmed due to current cardiac rhythm.  DG Abd 1 View  Result Date: 11/01/2022 CLINICAL DATA:  Ileus.  Nausea. EXAM: ABDOMEN - 1 VIEW COMPARISON:  Abdominal x-ray dated October 29, 2022. FINDINGS: Unchanged left-sided pelvic percutaneous pigtail drainage catheter. Unchanged diffusely dilated air-filled small bowel with loops measuring up to 6.6 cm in diameter. Unchanged dilatation of the transverse colon. No acute osseous abnormality. IMPRESSION: 1. Unchanged ileus. Electronically Signed   By:  Titus Dubin M.D.   On: 11/01/2022 09:48    Labs:  CBC: Recent Labs    10/31/22 0426 11/01/22 0532 11/01/22 1146 11/02/22 1053  WBC 22.7* 22.2* 22.3* 18.3*  HGB 8.4* 7.9* 8.0* 7.9*  HCT 24.2* 22.9* 23.3* 22.9*  PLT 418* 442* 456* 473*     COAGS: Recent Labs    10/23/22 2110  INR 1.2  APTT 26     BMP: Recent Labs    10/30/22 1127 10/31/22 0426 11/01/22 0532 11/02/22 1053  NA 136 132* 137 138  K 5.1 3.5 3.6 3.2*  CL 107 106 106 105  CO2 19* 20* 23 23  GLUCOSE 152* 162* 130* 130*  BUN 9 10 7  <5*  CALCIUM 8.5* 8.0* 8.4* 8.5*  CREATININE 0.76 0.76 0.80 0.73  GFRNONAA >60 >60 >60 >60     LIVER FUNCTION TESTS: Recent Labs    10/26/22 0406 10/27/22 0724 10/28/22 0728 11/02/22 1053  BILITOT 1.3* 1.2 1.2 0.8  AST 17 15 16  12*  ALT 14 13 12 14   ALKPHOS 105 124 112 111  PROT 5.8* 6.0* 5.4* 5.8*  ALBUMIN 1.9* 2.0* 1.7* 1.7*     Assessment and Plan: Intra-abdominal fluid collection s/p LLQ drain placement 3/26 by Dr. Vernard Gambles.   Drain Location: LLQ Size: Fr size: 12 Fr Date of placement: 3/26  Currently to: Drain collection device: gravity 24 hour output:  Output by Drain (mL) 10/31/22 0701 - 10/31/22 1900 10/31/22 1901 - 11/01/22 0700 11/01/22 0701 - 11/01/22 1900 11/01/22 1901 - 11/02/22 0700 11/02/22 0701 - 11/02/22 1512  Closed System Drain 1 Left LLQ Other (Comment) 12 Fr. 5.5 0 5 0     Plan: LLQ drain: To remain in place at this time.  Continue TID flushes with 5 cc NS. Record output Q shift. Dressing changes QD or PRN if soiled.  Call IR APP or on call IR MD if difficulty flushing or sudden change in drain output.   Abdominal fluid collection Newly identified intra-abdominal abscess on CT yesterday. Case reviewed by Dr. Earleen Newport as the request of Dr. Alfredia Ferguson.  Patient will be scheduled for additional intra-abdominal abscess aspiration and drainage.  NPO p MN Hold blood thinners. Discussed Eliquis dosing with Dr. Kathlene Cote who agrees to  proceed with hold of today and tomorrow's doses  which has been reflected on the [facility].  Orders in place.  Consider re-evaluation by surgery team.    Electronically Signed: Docia Barrier, PA 11/02/2022, 3:12 PM   I spent a total of 15 Minutes at the the patient's bedside AND on the patient's hospital floor or unit, greater than 50% of which was counseling/coordinating care for intra-abdominal abscess.

## 2022-11-02 NOTE — Progress Notes (Signed)
Brief Nutrition Support Note  MD placed order for TPN and PICC line placement yesterday evening. PICC placement pending. Pt has been without adequate nutrition since mid-March but has been on a liquid diet/NPO x 10 days. Pt is at high risk for refeeding.   Estimated Nutritional Needs:  Kcal:  1900-2100 kcal/d Protein:  100-120 g/d Fluid:  2L/d  Interventions: TPN management per pharmacy Recommend 100mg  of thiamine x 5 days and to monitor Mg and phosphorus q12h x 4 occurences.   Ranell Patrick, RD, LDN Clinical Dietitian RD pager # available in [place]  After hours/weekend pager # available in [facility]

## 2022-11-02 NOTE — Progress Notes (Signed)
Peripherally Inserted Central Catheter Placement  The IV Nurse has discussed with the patient and/or persons authorized to consent for the patient, the purpose of this procedure and the potential benefits and risks involved with this procedure.  The benefits include less needle sticks, lab draws from the catheter, and the patient may be discharged home with the catheter. Risks include, but not limited to, infection, bleeding, blood clot (thrombus formation), and puncture of an artery; nerve damage and irregular heartbeat and possibility to perform a PICC exchange if needed/ordered by physician.  Alternatives to this procedure were also discussed.  Bard Power PICC patient education guide, fact sheet on infection prevention and patient information card has been provided to patient /or left at bedside.    PICC Placement Documentation  PICC Double Lumen [id number] Right Basilic 43 cm 1 cm (Active)  Indication for Insertion or Continuance of Line Administration of hyperosmolar/irritating solutions (i.e. TPN, Vancomycin, etc.) 11/02/22 1001  Exposed Catheter (cm) 1 cm 11/02/22 1001  Site Assessment Clean, Dry, Intact 11/02/22 1001  Lumen #1 Status Flushed;Blood return noted;Saline locked 11/02/22 1001  Lumen #2 Status Flushed;Saline locked;Blood return noted 11/02/22 1001  Dressing Type Transparent;Securing device 11/02/22 1001  Dressing Status Antimicrobial disc in place 11/02/22 1001  Safety Lock Not Applicable Q000111Q [id number]  Line Adjustment (NICU/IV Team Only) No 11/02/22 1001  Dressing Change Due 11/09/22 11/02/22 1001       [name] 11/02/2022, 10:05 AM

## 2022-11-02 NOTE — Progress Notes (Signed)
Physical Therapy Treatment Patient Details Name: ICESS [name] MRN: [id number] DOB: 03/30/1971 Today's Date: 11/02/2022   History of Present Illness Pt is a 52 y.o. F who presents 10/23/2022 with acute metabolic encephalopathy and severe sepsis secondary to intra-abdominal abscess. S/p drain placement 3/26. 3/30 x-ray showing SBO vs ileus. Significant PMH:  morbid obesity with BMI greater than 50, systolic/diastolic CHF with previous ejection fraction of 30 to 35% and grade 1 diastolic dysfunction, chronic right ventricular failure, history of DVT, hypothyroidism, depression and anxiety and chronic abdominal pain.    PT Comments    Pt needed encouragement to attempt to progress OOB mobility today, appearing and reporting to be anxious and limited by abdominal pain. Began with x3 reps of clearing her buttocks from the elevated EOB to practice initiating transfers and gain pt's confidence before progressing to x4 full sit <> stand reps. Pt then progressed to taking a few lateral steps along EOB with RW and modA for support/balance. She is at high risk for falls, displaying a trunk sway and intermittent knee buckling, needing cues to assist her legs by pushing up through the RW. Pt declining to sit in recliner this date, but encouraged to progress to chair tomorrow. Will continue to follow acutely.     Recommendations for follow up therapy are one component of a multi-disciplinary discharge planning process, led by the attending physician.  Recommendations may be updated based on patient status, additional functional criteria and insurance authorization.  Follow Up Recommendations  Can patient physically be transported by private vehicle: No    Assistance Recommended at Discharge PRN  Patient can return home with the following Two people to help with walking and/or transfers;Two people to help with bathing/dressing/bathroom;Assistance with cooking/housework;Assist for transportation;Help with stairs  or ramp for entrance   Equipment Recommendations  Hospital bed    Recommendations for Other Services       Precautions / Restrictions Precautions Precautions: Fall Precaution Comments: LLQ drain Restrictions Weight Bearing Restrictions: No     Mobility  Bed Mobility Overal bed mobility: Needs Assistance Bed Mobility: Supine to Sit, Sit to Supine     Supine to sit: Min assist, HOB elevated Sit to supine: Mod assist, HOB elevated   General bed mobility comments: Initially attempting to cue pt to roll then sit up from sidelying but pt having difficulty reaching L UE to R rail, thus transitioned to supine > sit R EOB approach. Cues provided to bring bil legs off EOB first then use her R UE on the R bedrail and her L UE on the therapist to pull up to sit up, minA. ModA to lift her legs and pivot her hips with return to supine, cuing pt to descend onto her R elbow and control her trunk.    Transfers Overall transfer level: Needs assistance Equipment used: Rolling walker (2 wheels) Transfers: Sit to/from Stand Sit to Stand: Mod assist, From elevated surface           General transfer comment: Visual demonstration and verbal and tactile cues provided to rock x3 reps to gain momentum to stand, getting "nose over toe", placing feet flat on floor with good stance width and R hand on RW with L hand on bed to stand. Practiced clearing her buttocks ~4-6 inches from the bed surface initial x3 reps then performed full transfers to stand x4 reps from elevated EOB.    Ambulation/Gait Ambulation/Gait assistance: Mod assist Gait Distance (Feet): 2 Feet (x2 bouts of ~2 ft each bout)  Assistive device: Rolling walker (2 wheels) Gait Pattern/deviations: Decreased step length - right, Decreased step length - left, Decreased stride length, Trunk flexed, Knees buckling Gait velocity: reduced Gait velocity interpretation: <1.31 ft/sec, indicative of household ambulator   General Gait Details: Pt  taking slow, small and unsteady steps laterally to the R towards HOB using the RW for support and modA to maintain her balance. Pt needing cues to push up through the RW as her legs felt weak and intermittently a knee would buckle. Trunk sway noted.   Stairs             Wheelchair Mobility    Modified Rankin (Stroke Patients Only)       Balance Overall balance assessment: Needs assistance Sitting-balance support: Feet supported Sitting balance-Leahy Scale: Fair Sitting balance - Comments: Prefers UE support   Standing balance support: Bilateral upper extremity supported Standing balance-Leahy Scale: Poor Standing balance comment: reliant on Bil UE support and up to modA to maintain balance                            Cognition Arousal/Alertness: Awake/alert Behavior During Therapy: Flat affect, Anxious Overall Cognitive Status: Within Functional Limits for tasks assessed                                 General Comments: Very flat, understandable due to news during session that she may need another drain and a NG tube though. Needs encouragement to progress her functional mobility, admitting to some anxiety        Exercises      General Comments General comments (skin integrity, edema, etc.): encouraged OOB mobility and sitting upright to try to "wake up" her digestive system      Pertinent Vitals/Pain Pain Assessment Pain Assessment: Faces Faces Pain Scale: Hurts even more Pain Location: abdomen Pain Descriptors / Indicators: Discomfort, Grimacing, Guarding Pain Intervention(s): Limited activity within patient's tolerance, Monitored during session, Premedicated before session, Repositioned    Home Living                          Prior Function            PT Goals (current goals can now be found in the care plan section) Acute Rehab PT Goals Patient Stated Goal: less pain PT Goal Formulation: With patient Time For Goal  Achievement: 11/15/22 Potential to Achieve Goals: Fair Progress towards PT goals: Progressing toward goals    Frequency    Min 3X/week      PT Plan Equipment recommendations need to be updated    Co-evaluation              AM-PAC PT "6 Clicks" Mobility   Outcome Measure  Help needed turning from your back to your side while in a flat bed without using bedrails?: A Lot Help needed moving from lying on your back to sitting on the side of a flat bed without using bedrails?: A Little Help needed moving to and from a bed to a chair (including a wheelchair)?: A Lot Help needed standing up from a chair using your arms (e.g., wheelchair or bedside chair)?: A Lot Help needed to walk in hospital room?: Total Help needed climbing 3-5 steps with a railing? : Total 6 Click Score: 11    End of Session Equipment Utilized During Treatment: Gait  belt Activity Tolerance: Patient limited by pain Patient left: in bed;with call bell/phone within reach;with bed alarm set Nurse Communication: Mobility status PT Visit Diagnosis: Muscle weakness (generalized) (M62.81);Difficulty in walking, not elsewhere classified (R26.2);Pain;Unsteadiness on feet (R26.81);Other abnormalities of gait and mobility (R26.89) Pain - part of body:  (abdomen)     Time: [id number] PT Time Calculation (min) (ACUTE ONLY): 34 min  Charges:  $Therapeutic Activity: 23-37 mins                     Moishe Spice, PT, DPT Acute Rehabilitation Services  Office: [place] 11/02/2022, 5:31 PM

## 2022-11-03 ENCOUNTER — Encounter: Payer: Self-pay | Admitting: Physician Assistant

## 2022-11-03 ENCOUNTER — Ambulatory Visit: Payer: Medicaid Other | Admitting: Cardiology

## 2022-11-03 ENCOUNTER — Inpatient Hospital Stay (HOSPITAL_COMMUNITY): Payer: Medicaid Other

## 2022-11-03 ENCOUNTER — Telehealth: Payer: Self-pay | Admitting: Pharmacy Technician

## 2022-11-03 ENCOUNTER — Other Ambulatory Visit (HOSPITAL_COMMUNITY): Payer: Self-pay

## 2022-11-03 DIAGNOSIS — G9340 Encephalopathy, unspecified: Secondary | ICD-10-CM | POA: Diagnosis not present

## 2022-11-03 DIAGNOSIS — I5022 Chronic systolic (congestive) heart failure: Secondary | ICD-10-CM | POA: Diagnosis not present

## 2022-11-03 DIAGNOSIS — K651 Peritoneal abscess: Secondary | ICD-10-CM | POA: Diagnosis not present

## 2022-11-03 DIAGNOSIS — M329 Systemic lupus erythematosus, unspecified: Secondary | ICD-10-CM | POA: Diagnosis not present

## 2022-11-03 LAB — CBC WITH DIFFERENTIAL/PLATELET
Abs Immature Granulocytes: 0.17 10*3/uL — ABNORMAL HIGH (ref 0.00–0.07)
Basophils Absolute: 0 10*3/uL (ref 0.0–0.1)
Basophils Relative: 0 %
Eosinophils Absolute: 0.5 10*3/uL (ref 0.0–0.5)
Eosinophils Relative: 3 %
HCT: 22.8 % — ABNORMAL LOW (ref 36.0–46.0)
Hemoglobin: 7.4 g/dL — ABNORMAL LOW (ref 12.0–15.0)
Immature Granulocytes: 1 %
Lymphocytes Relative: 18 %
Lymphs Abs: 2.8 10*3/uL (ref 0.7–4.0)
MCH: 28.9 pg (ref 26.0–34.0)
MCHC: 32.5 g/dL (ref 30.0–36.0)
MCV: 89.1 fL (ref 80.0–100.0)
Monocytes Absolute: 1.3 10*3/uL — ABNORMAL HIGH (ref 0.1–1.0)
Monocytes Relative: 9 %
Neutro Abs: 10.5 10*3/uL — ABNORMAL HIGH (ref 1.7–7.7)
Neutrophils Relative %: 69 %
Platelets: 480 10*3/uL — ABNORMAL HIGH (ref 150–400)
RBC: 2.56 MIL/uL — ABNORMAL LOW (ref 3.87–5.11)
RDW: 14.4 % (ref 11.5–15.5)
WBC: 15.4 10*3/uL — ABNORMAL HIGH (ref 4.0–10.5)
nRBC: 0 % (ref 0.0–0.2)

## 2022-11-03 LAB — GLUCOSE, CAPILLARY
Glucose-Capillary: 122 mg/dL — ABNORMAL HIGH (ref 70–99)
Glucose-Capillary: 128 mg/dL — ABNORMAL HIGH (ref 70–99)
Glucose-Capillary: 137 mg/dL — ABNORMAL HIGH (ref 70–99)
Glucose-Capillary: 142 mg/dL — ABNORMAL HIGH (ref 70–99)
Glucose-Capillary: 144 mg/dL — ABNORMAL HIGH (ref 70–99)
Glucose-Capillary: 157 mg/dL — ABNORMAL HIGH (ref 70–99)

## 2022-11-03 LAB — COMPREHENSIVE METABOLIC PANEL
ALT: 10 U/L (ref 0–44)
AST: 12 U/L — ABNORMAL LOW (ref 15–41)
Albumin: 1.7 g/dL — ABNORMAL LOW (ref 3.5–5.0)
Alkaline Phosphatase: 108 U/L (ref 38–126)
Anion gap: 4 — ABNORMAL LOW (ref 5–15)
BUN: 5 mg/dL — ABNORMAL LOW (ref 6–20)
CO2: 23 mmol/L (ref 22–32)
Calcium: 7.9 mg/dL — ABNORMAL LOW (ref 8.9–10.3)
Chloride: 107 mmol/L (ref 98–111)
Creatinine, Ser: 0.64 mg/dL (ref 0.44–1.00)
GFR, Estimated: >60 mL/min (ref >60)
Glucose, Bld: 133 mg/dL — ABNORMAL HIGH (ref 70–99)
Potassium: 3.4 mmol/L — ABNORMAL LOW (ref 3.5–5.1)
Sodium: 134 mmol/L — ABNORMAL LOW (ref 135–145)
Total Bilirubin: 0.6 mg/dL (ref 0.3–1.2)
Total Protein: 5.5 g/dL — ABNORMAL LOW (ref 6.5–8.1)

## 2022-11-03 LAB — TRIGLYCERIDES: Triglycerides: 87 mg/dL (ref <150)

## 2022-11-03 LAB — MAGNESIUM: Magnesium: 2 mg/dL (ref 1.7–2.4)

## 2022-11-03 LAB — PHOSPHORUS: Phosphorus: 3 mg/dL (ref 2.5–4.6)

## 2022-11-03 MED ORDER — TRAVASOL 10 % IV SOLN
INTRAVENOUS | Status: AC
  Filled 2022-11-03: qty 1123.2

## 2022-11-03 MED ORDER — FENTANYL CITRATE (PF) 100 MCG/2ML IJ SOLN
INTRAMUSCULAR | Status: AC | PRN
  Administered 2022-11-03: 25 ug via INTRAVENOUS
  Administered 2022-11-03: 50 ug via INTRAVENOUS
  Administered 2022-11-03: 25 ug via INTRAVENOUS

## 2022-11-03 MED ORDER — FENTANYL CITRATE (PF) 100 MCG/2ML IJ SOLN
INTRAMUSCULAR | Status: AC
  Filled 2022-11-03: qty 2

## 2022-11-03 MED ORDER — MIDAZOLAM HCL 2 MG/2ML IJ SOLN
INTRAMUSCULAR | Status: AC
  Filled 2022-11-03: qty 4

## 2022-11-03 MED ORDER — MIDAZOLAM HCL 2 MG/2ML IJ SOLN
INTRAMUSCULAR | Status: AC | PRN
  Administered 2022-11-03: .5 mg via INTRAVENOUS
  Administered 2022-11-03: 1 mg via INTRAVENOUS

## 2022-11-03 NOTE — Telephone Encounter (Signed)
Pharmacy Patient Advocate Encounter   Received notification from CoverMyMeds that prior authorization for [facility] is required/requested.   PA submitted on 11/03/22 to (ins) [place] Medicaid via [employer] or ([place]) confirmation #  [id number] Prior Authorization has been approved.   PA #  PA Case ID: [id number] Effective dates: 11/03/22 through 05/02/23  Spoke with Pharmacy to process. Copay is $0

## 2022-11-03 NOTE — Progress Notes (Signed)
OT Cancellation Note  Patient Details Name: DEMETRIAS CUCUZZA MRN: [id number] DOB: 12/28/1970   Cancelled Treatment:    Reason Eval/Treat Not Completed: Patient at procedure or test/ unavailable. Plan to reattempt at a later time/date.  Tyrone Schimke, OT Acute Rehabilitation Services Office: [phone]   Hortencia Pilar 11/03/2022, 11:25 AM

## 2022-11-03 NOTE — Progress Notes (Signed)
Subjective: CC: Didn't tolerate ngt placement last night. She would like to hold off on placement if possible. Looks like gastrografin was given orally at [facility]. Xray pending this am.   She reports lower abdominal pain, worse on the left. Some upper abdominal bloating. Ongoing nausea. She is unsure if any worse than her baseline. No vomiting. Has begun passing flatus. No bm.   Objective: Vital signs in last 24 hours: Temp:  [98.1 F (36.7 C)-98.6 F (37 C)] 98.2 F (36.8 C) (04/04 0743) Pulse Rate:  [87-92] 92 (04/04 0743) Resp:  [14-18] 18 (04/04 0743) BP: (132-191)/(67-88) 164/81 (04/04 0743) SpO2:  [99 %-100 %] 100 % (04/04 0743) Weight:  [164.8 kg] 164.8 kg (04/04 0500) Last BM Date : 10/27/22  Intake/Output from previous day: 04/03 0701 - 04/04 0700 In: 3377.3 [P.O.:390; I.V.:2007.3; IV Piggyback:965] Out: 89 [Urine:600; Drains:5] Intake/Output this shift: Total I/O In: -  Out: 10 [Drains:10]  PE: Gen:  Alert, NAD, pleasant Abd: Obese, soft, some upper abdominal distension, generalized ttp that she states is worse on the left side of her abdomen. No rigidity or rebound. Some BS. IR drain with mixed SS/purulent drainage.    Lab Results:  Recent Labs    11/01/22 1146 11/02/22 1053  WBC 22.3* 18.3*  HGB 8.0* 7.9*  HCT 23.3* 22.9*  PLT 456* 473*   BMET Recent Labs    11/02/22 1053 11/03/22 0241  NA 138 134*  K 3.2* 3.4*  CL 105 107  CO2 23 23  GLUCOSE 130* 133*  BUN <5* 5*  CREATININE 0.73 0.64  CALCIUM 8.5* 7.9*   PT/INR No results for input(s): "LABPROT", "INR" in the last 72 hours. CMP     Component Value Date/Time   NA 134 (L) 11/03/2022 0241   NA 140 11/01/2018 1525   K 3.4 (L) 11/03/2022 0241   CL 107 11/03/2022 0241   CO2 23 11/03/2022 0241   GLUCOSE 133 (H) 11/03/2022 0241   BUN 5 (L) 11/03/2022 0241   BUN 8 11/01/2018 1525   CREATININE 0.64 11/03/2022 0241   CREATININE 0.99 12/22/2021 1052   CALCIUM 7.9 (L) 11/03/2022 0241    PROT 5.5 (L) 11/03/2022 0241   ALBUMIN 1.7 (L) 11/03/2022 0241   AST 12 (L) 11/03/2022 0241   AST 7 (L) 12/22/2021 1052   ALT 10 11/03/2022 0241   ALT 6 12/22/2021 1052   ALKPHOS 108 11/03/2022 0241   BILITOT 0.6 11/03/2022 0241   BILITOT 0.4 12/22/2021 1052   GFRNONAA >60 11/03/2022 0241   GFRNONAA >60 12/22/2021 1052   GFRAA >60 02/07/2020 0315   Lipase     Component Value Date/Time   LIPASE 32 10/21/2022 1648    Studies/Results: CT ABDOMEN PELVIS W CONTRAST  Result Date: 11/01/2022 CLINICAL DATA:  Intra-abdominal abscess, follow-up examination EXAM: CT ABDOMEN AND PELVIS WITH CONTRAST TECHNIQUE: Multidetector CT imaging of the abdomen and pelvis was performed using the standard protocol following bolus administration of intravenous contrast. RADIATION DOSE REDUCTION: This exam was performed according to the departmental dose-optimization program which includes automated exposure control, adjustment of the mA and/or kV according to patient size and/or use of iterative reconstruction technique. CONTRAST:  154mL OMNIPAQUE IOHEXOL 350 MG/ML SOLN COMPARISON:  10/23/2022 FINDINGS: Lower chest: Small right pleural effusion. Mild bibasilar atelectasis. Cardiac size within normal limits. Hepatobiliary: Vicarious excretion of contrast within the gallbladder which is otherwise unremarkable. Ill-defined hypodensity within the right hepatic lobe adjacent the gallbladder fossa is nonspecific, but is  unchanged. No intra or extrahepatic biliary ductal dilation. No enhancing intrahepatic mass. Pancreas: Unremarkable Spleen: Unremarkable Adrenals/Urinary Tract: The adrenal glands are unremarkable. Punctate 2 mm nonobstructing calculus noted within the upper pole the right kidney. The kidneys are otherwise unremarkable. The bladder is largely decompressed. Infiltration is again seen superior to the bladder dome related to the adjacent inflammatory process, unchanged. Stomach/Bowel: Developing mid small bowel  obstruction with a single point of transition identified within the mid abdomen at axial image # 54/3 with fluid distension of the proximal small bowel and decompression of the more distal small bowel and colon. No free intraperitoneal gas. Interval percutaneous drainage of the loculated inflammatory appearing fluid collection within the left adnexa with complete evacuation of the previously noted loculated fluid. There is extensive surrounding inflammatory change and poorly loculated fluid seen medial to the drainage catheter, unchanged from prior examination. Several small bowel loops adjacent to this inflammatory process demonstrate mild bowel wall thickening, likely reactive. New loculated rim enhancing fluid collection is seen within the right lower quadrant abutting several loops of a distal small bowel measuring 3.2 x 7.4 x 5.0 cm on coronal image # 89/7 and axial image # 68/3. Stable mild free fluid within the right pericolic gutter and within the cul-de-sac. Vascular/Lymphatic: No significant vascular findings are present. No enlarged abdominal or pelvic lymph nodes. Reproductive: As noted above, the loculated fluid collection in keeping with a tubo-ovarian abscess within the left adnexa has been completely evacuated following placement of a left lower quadrant percutaneous drainage catheter. Extensive surrounding inflammatory changes and poorly loculated fluid within the right adnexa persist. Other: Moderate subcutaneous body wall edema. No abdominal wall hernia. Musculoskeletal: No acute bone abnormality. No lytic or blastic bone lesion. IMPRESSION: 1. Interval percutaneous drainage of the loculated inflammatory appearing fluid collection within the left adnexa with complete evacuation of the previously noted loculated fluid. Extensive surrounding inflammatory change and poorly loculated fluid within the right adnexa persist. 2. New loculated rim enhancing fluid collection within the right lower quadrant  abutting several loops of a distal small bowel measuring 3.2 x 7.4 x 5.0 cm. 3. Developing mid small bowel obstruction with a single point of transition identified within the mid abdomen. 4. Minimal right nonobstructing nephrolithiasis. 5. Moderate subcutaneous body wall edema. Electronically Signed   By: Fidela Salisbury M.D.   On: 11/01/2022 23:20   [place] EKG SITE RITE  Result Date: 11/01/2022 If Site Rite image not attached, placement could not be confirmed due to current cardiac rhythm.  DG Abd 1 View  Result Date: 11/01/2022 CLINICAL DATA:  Ileus.  Nausea. EXAM: ABDOMEN - 1 VIEW COMPARISON:  Abdominal x-ray dated October 29, 2022. FINDINGS: Unchanged left-sided pelvic percutaneous pigtail drainage catheter. Unchanged diffusely dilated air-filled small bowel with loops measuring up to 6.6 cm in diameter. Unchanged dilatation of the transverse colon. No acute osseous abnormality. IMPRESSION: 1. Unchanged ileus. Electronically Signed   By: Titus Dubin M.D.   On: 11/01/2022 09:48    Anti-infectives: Anti-infectives (From admission, onward)    Start     Dose/Rate Route Frequency Ordered Stop   10/30/22 2000  Ampicillin-Sulbactam (UNASYN) 3 g in sodium chloride 0.9 % 100 mL IVPB        3 g 200 mL/hr over 30 Minutes Intravenous Every 6 hours 10/30/22 1530     10/26/22 2200  meropenem (MERREM) 2 g in sodium chloride 0.9 % 100 mL IVPB  Status:  Discontinued        2 g  280 mL/hr over 30 Minutes Intravenous Every 8 hours 10/26/22 1409 10/30/22 1530   10/26/22 1000  fluconazole (DIFLUCAN) tablet 100 mg       See Hyperspace for full Linked Orders Report.   100 mg Oral Daily 10/25/22 1412 10/31/22 1011   10/25/22 2000  doxycycline (VIBRAMYCIN) 100 mg in sodium chloride 0.9 % 250 mL IVPB  Status:  Discontinued        100 mg 125 mL/hr over 120 Minutes Intravenous Every 12 hours 10/25/22 1304 10/30/22 1530   10/25/22 1500  fluconazole (DIFLUCAN) tablet 200 mg       See Hyperspace for full Linked Orders  Report.   200 mg Oral Daily 10/25/22 1412 10/25/22 1718   10/25/22 1400  meropenem (MERREM) 2 g in sodium chloride 0.9 % 100 mL IVPB  Status:  Discontinued        2 g 280 mL/hr over 30 Minutes Intravenous Every 12 hours 10/25/22 1304 10/26/22 1409   10/25/22 1200  doxycycline (VIBRA-TABS) tablet 100 mg  Status:  Discontinued        100 mg Oral Every 12 hours 10/25/22 1103 10/25/22 1109   10/24/22 0045  cefTRIAXone (ROCEPHIN) 2 g in sodium chloride 0.9 % 100 mL IVPB  Status:  Discontinued        2 g 200 mL/hr over 30 Minutes Intravenous Daily at bedtime 10/24/22 0025 10/25/22 1102   10/24/22 0045  metroNIDAZOLE (FLAGYL) IVPB 500 mg  Status:  Discontinued        500 mg 100 mL/hr over 60 Minutes Intravenous 2 times daily 10/24/22 0025 10/25/22 1102   10/24/22 0030  doxycycline (VIBRA-TABS) tablet 100 mg  Status:  Discontinued        100 mg Oral Every 12 hours 10/24/22 0025 10/25/22 1102   10/24/22 0000  piperacillin-tazobactam (ZOSYN) IVPB 3.375 g  Status:  Discontinued        3.375 g 100 mL/hr over 30 Minutes Intravenous  Once 10/23/22 2348 10/24/22 0025        Assessment/Plan Left tubo-ovarian abscess s/p IR drain placement 3/26 New RLQ fluid collection concerning for abscess SBO - This is a 52yo female with multiple co-morbidities including Lupus on chronic immunosuppressant, who was admitted to [facility] 3/26 with AMS and found to have a Left tubo-ovarian abscess which was drained by IR 3/26. She had a repeat CT scan 4/2 that showed a new 3.2 x 7.4 x 5.0 cm fluid collection in the RLQ. Appendix is not visualized but it would be odd for her to have perforated appendicitis in addition to a left tubo-ovarian abscess. Unclear etiology of fluid collection but I do not believe she needs acute surgical intervention. IR planning percutaneously drain this second fluid collection. ID is following and managing her antibiotics.  CT scan also concerning for SBO. Patient did not tolerate NGT placement. SBO  protocol being done orally. Will follow up on delayed films. If worsening abdominal pain, n/v would recommend reattempt at NGT placement. Hopefully will improve with conservative management.  We will follow.   FEN - IVF, NPO VTE - SCDs, hold eliquis for IR procedure. Okay for heparin gtt from our standpoint - please clarify with IR before initiation.  ID - unasyn, per ID Foley - none   I reviewed nursing notes, ED provider notes, Consultant (IR) notes, hospitalist notes, last 24 h vitals and pain scores, last 48 h intake and output, last 24 h labs and trends, and last 24 h imaging results.  LOS: 10 days    Jillyn Ledger , [facility] Surgery 11/03/2022, 9:14 AM Please see Amion for pager number during day hours 7:00am-4:30pm

## 2022-11-03 NOTE — Progress Notes (Signed)
PROGRESS NOTE    [name]  [id number] DOB: [birth year] DOA: 10/23/2022 PCP: Bartholome Bill, MD   Brief Narrative:  The patient is a 52 year old African-American female with past medical history of morbid obesity with BMI greater than 50, systolic/diastolic CHF with previous ejection fraction of 30 to 35% and grade 1 diastolic dysfunction, chronic right ventricular failure, history of DVT, hypothyroidism, depression and anxiety and chronic abdominal pain who presented to the emergency room on 3/24 with complaints of lightheadedness and abdominal pain.  She was also found to be somewhat confused.  Workup revealed severe sepsis secondary to intra-abdominal abscess concerning for PID/TOA and case discussed with gynecology.  Mass not present on CT on 3/15.  Interventional radiology consulted and patient underwent drain placement 3/26.  Mentation much improved.   Patient had been on a limited diet full liquids due to complaints of abdominal pain.  Started having some nausea and vomiting 3/29.  Abdominal x-ray noted dilated small bowel loops concerning for developing ileus.  Symptoms have persisted and repeat x-ray on 3/30 confirms SBO versus ileus.  Patient has had a prolonged period without eating much, and with persistent ileus, started on PICC line and TPN 4/2.    Repeat CT scan done and showed a newly identified intra-abdominal abscess and so interventional radiology was consulted and they feel that she is a candidate for a repeat drain placement and so she has been made n.p.o. at midnight and her blood thinners are being held.  Plan is for vein placement in the a.m. with Dr. Kathlene Cote.  General surgery was also consulted given her ileus versus a small bowel obstruction so general surgery recommended NG tube placement for decompression of small bowel obstruction protocol with Gastrografin delayed film however patient was not able to tolerate NG to placement.  SBO protocol was done via oral  Gastrografin and delayed films still pending.  She underwent drain placement today for her new found abscess.  Assessment and Plan:  Syncope. Acute metabolic encephalopathy/hypersomnolence-resolved -Initially presented with what appears to be a syncopal event. There was a concern for seizures, however EEG was unremarkable.   -Ammonia level, TSH, HIV, RPR all unremarkable.   -VBG negative for hypercarbia.   -Neurology evaluated and felt findings unrelated to any neurological issue.  Initial concern this could be secondary to pain medication.   -Patient's IV pain medicine was held and she became more appropriate and alert.   -Felt to be multifactorial secondary to decreased clearance from acute kidney injury, additional anesthesia and pain medication from intervention radiology for drain placement and IV pain medicine.   -By 3/28, patient much improved today despite getting continued pain medication is alert and oriented x 3 -Will need orthostatics prior to discharge and PT OT evaluation recommending SNF   Severe sepsis secondary to intra-abdominal abscess in patient with acute on chronic abdominal pain. -Patient met criteria for severe sepsis given fever, leukocytosis,  -AKI creatinine greater than 2 and abdominal source.   -CT abdomen shows evidence of a 5 x 5 cm fluid collection in the left hemipelvis concerning for developing abscess.  GYN was consulted.  Discussed with Dr. Roselie Awkward. Patient has not had sex in 4 years and the mass was not present on CT 3/15.   -GYN does not think that the patient is suffering from a tubo-ovarian abscess.   -Patient underwent IR guided drain placement on 3/26.  -Following drain placement and antibiotic adjustment, white blood cell count and procalcitonin initially improved and started  to trend back upward.   -Now with minimal output.  Intervention radiology following.   -Repeat CT Scan done and showed "Interval percutaneous drainage of the loculated  inflammatory appearing fluid collection within the left adnexa with complete evacuation of the previously noted loculated fluid. Extensive surrounding inflammatory change and poorly loculated fluid within the right adnexa persist. New loculated rim enhancing fluid collection within the right lower quadrant abutting several loops of a distal small bowel measuring 3.2 x 7.4 x 5.0 cm. Developing mid small bowel obstruction with a single point of transition identified within the mid abdomen. Minimal right nonobstructing nephrolithiasis. Moderate subcutaneous body wall edema." -WBC Trend:  Recent Labs  Lab 10/28/22 0728 10/29/22 1037 10/30/22 1127 10/31/22 0426 11/01/22 0532 11/01/22 1146 11/02/22 1053  WBC 13.1* 22.2* 23.3* 22.7* 22.2* 22.3* 18.3*  -Infectious Disease has been consulted and following -Given new abscess and a developing mid small bowel obstruction have consulted general surgery as well as interventional radiology and the plan is for another drain placement in her new abscess; drain was placed via CT-guided drainage of the right lower quadrant peritoneal fluid collection by Dr. Kathlene Cote and she now has a 10 [place] drain the fluid sent for culture analysis.   UTI -Patient's white blood cell count initially improved following antibiotics and drain placement, down to 13 on 3/29.  -However, since then has been steadily rising, currently at 23.  Urine not very impressive.   -Negative cultures.  Discussed with infectious disease and they feel white count more likely related to SBO/ileus.   -See Above   Persistent ileus vs SBO -Persisting.   -Treating underlying infection and electrolyte abnormalities.  Ice chips.   -Will have PT see to increase ambulation.   -Have added TPN due to prolonged period without nutrition.  -Rechecking abdominal CT for follow-up on abscess and as above -Now she has a developing mid small bowel obstruction with a single point of transition identified within  the mid abdomen. -General surgery has been consulted and agree with IR to percutaneously drain the second fluid collection and recommending NG tube placement for decompression and small bowel protocol with Gastrografin delayed film however patient refused NG tube placement currently; she did not tolerate NG tube placement but small bowel protocol done orally and there were no follow-up on her delayed films.  If she has worsening abdominal pain nausea vomiting they are recommending reattempting NG tube placement and feel that this can hopefully improve with conservative management -X-Ray done this Am and showed "Unchanged left-sided pelvic percutaneous pigtail drainage catheter. Unchanged diffusely dilated air-filled small bowel with loops measuring up to 6.6 cm in diameter. Unchanged dilatation of the transverse colon. No acute osseous abnormality."   AKI, resolved. -Suspect ATN in the setting of sepsis, possibility of contrast-induced injury as well as prerenal etiology in the setting of poor p.o. intake.   -BUN/Cr Trend: Recent Labs  Lab 10/28/22 0728 10/29/22 1037 10/30/22 1127 10/31/22 0426 11/01/22 0532 11/02/22 1053 11/03/22 0241  BUN 16 12 9 10 7  <5* 5*  CREATININE 0.93 0.83 0.76 0.76 0.80 0.73 0.64  -Improved with IVF and now getting LR at 35 mL/hr as patient is on TPN as she is on TPN at 90 mL/hr -Continue to Monitor for S/Sx of Volume Overload  -Avoid Nephrotoxic Medications, Contrast Dyes, Hypotension and Dehydration to Ensure Adequate Renal Perfusion and will need to Renally Adjust Meds -Continue to Monitor and Trend Renal Function carefully and repeat CMP in the AM  -Monitor closely  due to history of CHF.  Much improved by 3/28   Hypersomnolence. OHS. Obesity class III. -Complicates overall prognosis and care and patient at high risk for poor outome -Estimated body mass index is 55.24 kg/m as calculated from the following:   Height as of this encounter: 5\' 8"  (1.727 m).    Weight as of this encounter: 164.8 kg.  -Patient was recommended to have an outpatient sleep evaluation during her recent hospitalization.   Chronic Combined Systolic and Diastolic CHF -Echocardiogram done during this hospitalization notes much improvement in ejection fraction and indeterminate diastolic function.   -BNP within normal limit  Intake/Output Summary (Last 24 hours) at 11/03/2022 1620 Last data filed at 11/03/2022 0831 Gross per 24 hour  Intake 3367.32 ml  Output 610 ml  Net 2757.32 ml  -Continue to Monitor for S/Sx of Volume Overload -Currently appears euvolemic   Gastroparesis. -Patient is not on any medication for gastroparesis.   -Was Tolerating p.o. but still reporting some nausea and vomiting.  -Intolerant of Reglan.   -Abdominal x-ray notes multiple dilated air-filled loops and repeat CT scan as above.   -Will replace electrolytes accordingly. -Currently on Zofran. -Will resume home scopolamine patch.   Hypokalemia -Patient's K+ Level Trend: Recent Labs  Lab 10/28/22 0728 10/29/22 1037 10/30/22 1127 10/31/22 0426 11/01/22 0532 11/02/22 1053 11/03/22 0241  K 3.4* 3.7 5.1 3.5 3.6 3.2* 3.4*  -Replete with TPN -Continue to Monitor and Replete as Necessary -Repeat CMP in the AM    Hypomagnesemia -Patient's Mag Level Trend: Recent Labs  Lab 10/24/22 0643 11/01/22 0532 11/02/22 1053 11/03/22 0241  MG 1.7 1.7 1.6* 2.0  -Replete with IV Mag Sulfate 4 grams -Continue to Monitor and Replete as Necessary -Repeat Mag in the AM    Severe Protein Calorie Malnutrition -Nutrition Status: Nutrition Problem: Inadequate oral intake Etiology: poor appetite, nausea Signs/Symptoms: meal completion < 25%, NPO status, per patient/family report Interventions: Refer to RD note for recommendations   History of PE. -Had a PE last October, told that she needs to be on Eliquis indefinitely.  Currently being on Eliquis   Type 2 diabetes mellitus, uncontrolled with  hyperglycemia with gastroparesis. -With long-term insulin use. -Patient is actually on U-500 insulin at home. -Now with SBO/ileus, decreased long-acting insulin down to 10 units only plus Q for her sliding scale. -Continue monitor CBGs per protocol   Lupus and Sjogren's Syndrome. -Patient has on hydroxychloroquine, belimumab as well as Imuran which have all been held -Patient remains at risk for infections. -Continue Monitor for now.   Anxiety and depression. -Continue home regimen and C/w Quetiapine 200 mg po daily    Hypothyroidism. -Continue Levothyroxine 50 mcg po Daily .   Overactive bladder. -On Ditropan, currently held   GERD. -Continue PPI with Pantoprazole 40 mg po Daily but will change to IV now   Diabetic neuropathy. -Has been on Lyrica. -Due to encephalopathy we will hold it for now.  Restart slowly   Hyperlipidemia. -Holding Crestor while NPO with meds and sips and on TPN with ? SBO   Oral candidiasis. -Will need to follow and see if she was treated with Diflucan.   Hypoalbuminemia -Patient's Albumin Trend: Recent Labs  Lab 10/21/22 1648 10/23/22 2110 10/26/22 0406 10/27/22 0724 10/28/22 0728 11/02/22 1053 11/03/22 0241  ALBUMIN 2.9* 2.9* 1.9* 2.0* 1.7* 1.7* 1.7*  -Continue to Monitor and Trend and repeat CMP in the AM   Obesity -Complicates overall prognosis and care -Estimated body mass index is 55.24 kg/m  as calculated from the following:   Height as of this encounter: 5\' 8"  (1.727 m).   Weight as of this encounter: 164.8 kg.  -Weight Loss and Dietary Counseling given  DVT prophylaxis: apixaban (ELIQUIS) tablet 2.5 mg Start: 10/25/22 2200 Place and maintain sequential compression device Start: 10/25/22 1103 apixaban (ELIQUIS) tablet 2.5 mg    Code Status: Full Code Family Communication: No family present at bedside  Disposition Plan:  Level of care: Telemetry Medical Status is: Inpatient Remains inpatient appropriate because: Further  clinical improvement and clearance by the specialist   Consultants:  Infectious Diseases General Surgery Interventional Radiology   Procedures:  As delineated as above  Antimicrobials:  Anti-infectives (From admission, onward)    Start     Dose/Rate Route Frequency Ordered Stop   10/30/22 2000  Ampicillin-Sulbactam (UNASYN) 3 g in sodium chloride 0.9 % 100 mL IVPB        3 g 200 mL/hr over 30 Minutes Intravenous Every 6 hours 10/30/22 1530     10/26/22 2200  meropenem (MERREM) 2 g in sodium chloride 0.9 % 100 mL IVPB  Status:  Discontinued        2 g 280 mL/hr over 30 Minutes Intravenous Every 8 hours 10/26/22 1409 10/30/22 1530   10/26/22 1000  fluconazole (DIFLUCAN) tablet 100 mg       See Hyperspace for full Linked Orders Report.   100 mg Oral Daily 10/25/22 1412 10/31/22 1011   10/25/22 2000  doxycycline (VIBRAMYCIN) 100 mg in sodium chloride 0.9 % 250 mL IVPB  Status:  Discontinued        100 mg 125 mL/hr over 120 Minutes Intravenous Every 12 hours 10/25/22 1304 10/30/22 1530   10/25/22 1500  fluconazole (DIFLUCAN) tablet 200 mg       See Hyperspace for full Linked Orders Report.   200 mg Oral Daily 10/25/22 1412 10/25/22 1718   10/25/22 1400  meropenem (MERREM) 2 g in sodium chloride 0.9 % 100 mL IVPB  Status:  Discontinued        2 g 280 mL/hr over 30 Minutes Intravenous Every 12 hours 10/25/22 1304 10/26/22 1409   10/25/22 1200  doxycycline (VIBRA-TABS) tablet 100 mg  Status:  Discontinued        100 mg Oral Every 12 hours 10/25/22 1103 10/25/22 1109   10/24/22 0045  cefTRIAXone (ROCEPHIN) 2 g in sodium chloride 0.9 % 100 mL IVPB  Status:  Discontinued        2 g 200 mL/hr over 30 Minutes Intravenous Daily at bedtime 10/24/22 0025 10/25/22 1102   10/24/22 0045  metroNIDAZOLE (FLAGYL) IVPB 500 mg  Status:  Discontinued        500 mg 100 mL/hr over 60 Minutes Intravenous 2 times daily 10/24/22 0025 10/25/22 1102   10/24/22 0030  doxycycline (VIBRA-TABS) tablet 100 mg   Status:  Discontinued        100 mg Oral Every 12 hours 10/24/22 0025 10/25/22 1102   10/24/22 0000  piperacillin-tazobactam (ZOSYN) IVPB 3.375 g  Status:  Discontinued        3.375 g 100 mL/hr over 30 Minutes Intravenous  Once 10/23/22 2348 10/24/22 0025       Subjective: Seen and examined at bedside states that she is still having some abdominal pain especially when she stood up.  Was about to go down for an x-ray.  Still waiting for her drain to be placed in her abdomen.  No nausea or vomiting.  Feels okay.  No other concerns or complaints at this time.  Objective: Vitals:   11/03/22 1253 11/03/22 1258 11/03/22 1304 11/03/22 1400  BP: (!) 177/75 (!) 189/81 (!) 164/88 (!) 197/58  Pulse: 91 90 91 84  Resp: (!) 22 (!) 23 (!) 23 20  Temp:      TempSrc:      SpO2: 100% 100% 100% 100%  Weight:      Height:        Intake/Output Summary (Last 24 hours) at 11/03/2022 1617 Last data filed at 11/03/2022 0831 Gross per 24 hour  Intake 3367.32 ml  Output 610 ml  Net 2757.32 ml   Filed Weights   11/01/22 0600 11/02/22 0500 11/03/22 0500  Weight: (!) 165.4 kg (!) 164 kg (!) 164.8 kg   Examination: Physical Exam:  Constitutional: WN/WD super morbidly obese African-American female who appears little uncomfortable still complaining of some abdominal pain Respiratory: Diminished to auscultation bilaterally with coarse breath sounds, no wheezing, rales, rhonchi or crackles. Normal respiratory effort and patient is not tachypenic. No accessory muscle use.  Unlabored breathing Cardiovascular: RRR, no murmurs / rubs / gallops. S1 and S2 auscultated.  No appreciable extremity edema Abdomen: Soft, tender to palpate and has a distended body habitus as well as an abdominal drain in the left side. Bowel sounds positive.  GU: Deferred. Musculoskeletal: No clubbing / cyanosis of digits/nails. No joint deformity upper and lower extremities.  Skin: No rashes, lesions, ulcers on a limited skin evaluation.  No induration; Warm and dry.  Neurologic: CN 2-12 grossly intact with no focal deficits.  Romberg sign and cerebellar reflexes not assessed.  Psychiatric: Normal judgment and insight. Alert and oriented x 3. Slightly depressed appearing mood and appropriate affect.   Data Reviewed: I have personally reviewed following labs and imaging studies  CBC: Recent Labs  Lab 10/30/22 1127 10/31/22 0426 11/01/22 0532 11/01/22 1146 11/02/22 1053  WBC 23.3* 22.7* 22.2* 22.3* 18.3*  NEUTROABS  --   --   --  15.7*  --   HGB 8.7* 8.4* 7.9* 8.0* 7.9*  HCT 24.0* 24.2* 22.9* 23.3* 22.9*  MCV 82.8 85.2 84.5 85.0 84.5  PLT 395 418* 442* 456* [id number]*   Basic Metabolic Panel: Recent Labs  Lab 10/30/22 1127 10/31/22 0426 11/01/22 0532 11/02/22 1053 11/03/22 0241  NA 136 132* 137 138 134*  K 5.1 3.5 3.6 3.2* 3.4*  CL 107 106 106 105 107  CO2 19* 20* 23 23 23   GLUCOSE 152* 162* 130* 130* 133*  BUN 9 10 7  <5* 5*  CREATININE 0.76 0.76 0.80 0.73 0.64  CALCIUM 8.5* 8.0* 8.4* 8.5* 7.9*  MG  --   --  1.7 1.6* 2.0  PHOS  --   --   --  2.5 3.0   GFR: Estimated Creatinine Clearance: 135.4 mL/min (by C-G formula based on SCr of 0.64 mg/dL). Liver Function Tests: Recent Labs  Lab 10/28/22 0728 11/02/22 1053 11/03/22 0241  AST 16 12* 12*  ALT 12 14 10   ALKPHOS 112 111 108  BILITOT 1.2 0.8 0.6  PROT 5.4* 5.8* 5.5*  ALBUMIN 1.7* 1.7* 1.7*   No results for input(s): "LIPASE", "AMYLASE" in the last 168 hours. No results for input(s): "AMMONIA" in the last 168 hours. Coagulation Profile: No results for input(s): "INR", "PROTIME" in the last 168 hours. Cardiac Enzymes: No results for input(s): "CKTOTAL", "CKMB", "CKMBINDEX", "TROPONINI" in the last 168 hours. BNP (last 3 results) No results for input(s): "PROBNP" in the last 8760 hours. HbA1C:  No results for input(s): "HGBA1C" in the last 72 hours. CBG: Recent Labs  Lab 11/02/22 1946 11/03/22 0029 11/03/22 0340 11/03/22 0746 11/03/22 1345   GLUCAP 132* 128* 137* 144* 157*   Lipid Profile: Recent Labs    11/02/22 1051 11/03/22 0241  TRIG 85 87   Thyroid Function Tests: No results for input(s): "TSH", "T4TOTAL", "FREET4", "T3FREE", "THYROIDAB" in the last 72 hours. Anemia Panel: No results for input(s): "VITAMINB12", "FOLATE", "FERRITIN", "TIBC", "IRON", "RETICCTPCT" in the last 72 hours. Sepsis Labs: Recent Labs  Lab 10/30/22 1450 10/31/22 0426 11/01/22 0532 11/02/22 0500  PROCALCITON 0.68 0.45 0.32 0.27    Recent Results (from the past 240 hour(s))  Aerobic/Anaerobic Culture w Gram Stain (surgical/deep wound)     Status: None   Collection Time: 10/25/22  1:40 PM   Specimen: Abscess  Result Value Ref Range Status   Specimen Description ABSCESS  Final   Special Requests NONE  Final   Gram Stain   Final    ABUNDANT WBC PRESENT, PREDOMINANTLY PMN ABUNDANT GRAM NEGATIVE RODS ABUNDANT GRAM POSITIVE COCCI IN PAIRS IN CHAINS    Culture   Final    ABUNDANT STREPTOCOCCUS ANGINOSIS GROUP B STREP(S.AGALACTIAE)ISOLATED TESTING AGAINST S. AGALACTIAE NOT ROUTINELY PERFORMED DUE TO PREDICTABILITY OF AMP/PEN/VAN SUSCEPTIBILITY. ABUNDANT PREVOTELLA BIVIA BETA LACTAMASE POSITIVE Performed at [facility], [place] [address], [place], [place] [address]    Report Status 10/29/2022 FINAL  Final   Organism ID, Bacteria STREPTOCOCCUS ANGINOSIS  Final      Susceptibility   Streptococcus anginosis - MIC*    PENICILLIN <=0.06 SENSITIVE Sensitive     CEFTRIAXONE <=0.12 SENSITIVE Sensitive     ERYTHROMYCIN 2 RESISTANT Resistant     LEVOFLOXACIN 0.5 SENSITIVE Sensitive     VANCOMYCIN 0.5 SENSITIVE Sensitive     * ABUNDANT STREPTOCOCCUS ANGINOSIS  Culture, blood (Routine X 2) w Reflex to ID Panel     Status: None   Collection Time: 10/25/22  2:47 PM   Specimen: BLOOD RIGHT ARM  Result Value Ref Range Status   Specimen Description BLOOD RIGHT ARM  Final   Special Requests   Final    BOTTLES DRAWN AEROBIC AND ANAEROBIC  Blood Culture adequate volume   Culture   Final    NO GROWTH 5 DAYS Performed at [facility], [address], [place], [place] [address]    Report Status 10/30/2022 FINAL  Final  Culture, blood (Routine X 2) w Reflex to ID Panel     Status: None   Collection Time: 10/25/22  2:47 PM   Specimen: BLOOD RIGHT ARM  Result Value Ref Range Status   Specimen Description BLOOD RIGHT ARM  Final   Special Requests   Final    BOTTLES DRAWN AEROBIC AND ANAEROBIC Blood Culture results may not be optimal due to an inadequate volume of blood received in culture bottles   Culture   Final    NO GROWTH 5 DAYS Performed at [facility], [place] [address], [place], [place] [address]    Report Status 10/30/2022 FINAL  Final  Urine Culture     Status: Abnormal   Collection Time: 10/30/22  3:00 AM   Specimen: Urine, Random  Result Value Ref Range Status   Specimen Description URINE, RANDOM  Final   Special Requests NONE Reflexed from [phone]  Final   Culture (A)  Final    <10,000 COLONIES/mL INSIGNIFICANT GROWTH Performed at [facility], [place] [address], [place], [place]  [id number]    Report Status 10/31/2022 FINAL  Final   Radiology Studies: CT GUIDED PERITONEAL/RETROPERITONEAL FLUID DRAIN BY PERC CATH  Result Date: 11/03/2022 CLINICAL DATA:  Status post previous percutaneous catheter drainage of left-sided pelvic abscess on 10/25/2022. New intra-abdominal fluid collection in the right lower quadrant now requiring second percutaneous catheter drainage procedure. EXAM: CT GUIDED CATHETER DRAINAGE OF PERITONEAL ABSCESS ANESTHESIA/SEDATION: Moderate (conscious) sedation was employed during this procedure. A total of Versed 2.0 mg and Fentanyl 100 mcg was administered intravenously. Moderate Sedation Time: 26 minutes. The patient's level of consciousness and vital signs were monitored continuously by radiology nursing throughout the procedure under my direct supervision. PROCEDURE: The procedure,  risks, benefits, and alternatives were explained to the patient. Questions regarding the procedure were encouraged and answered. The patient understands and consents to the procedure. A time out was performed prior to initiating the procedure. The right lower abdominal wall was prepped with chlorhexidine in a sterile fashion, and a sterile drape was applied covering the operative field. A sterile gown and sterile gloves were used for the procedure. Local anesthesia was provided with 1% Lidocaine. CT was performed in a supine position through the mid to lower abdomen and pelvis. After choosing a site for skin entry an 18 gauge trocar needle was advanced under CT guidance to the level of a right lower quadrant anterior peritoneal abscess. After confirming needle tip position, fluid was aspirated. A guidewire was advanced into the fluid collection and the needle removed. The tract was dilated over the wire and a 10 French percutaneous drainage catheter placed. Catheter position was confirmed by CT. The drainage catheter was further aspirated and a fluid sample sent for culture analysis. The drain was flushed and connected to a suction bulb. It was secured at the skin with a Prolene retention suture and adhesive retention device. RADIATION DOSE REDUCTION: This exam was performed according to the departmental dose-optimization program which includes automated exposure control, adjustment of the mA and/or kV according to patient size and/or use of iterative reconstruction technique. COMPLICATIONS: None FINDINGS: Fluid collection in the right lower quadrant again localizes lateral to bowel and measures roughly 4 cm in maximum diameter. The fluid collection in the left pelvis is completely decompressed by the previously placed percutaneous drainage catheter. After advancing a trocar needle into the collection, there was return of turbid, yellow fluid. After drainage catheter placement, a larger fluid sample was withdrawn  and sent for culture analysis. After drain placement, there is good return fluid. IMPRESSION: CT-guided percutaneous catheter drainage of right lower quadrant peritoneal abscess. A 10 French drainage catheter was placed and attached to suction bulb drainage. A sample of turbid yellow fluid was sent for culture analysis. Initial CT shows complete decompression of the left pelvic fluid collection after prior catheter drainage. Electronically Signed   By: Aletta Edouard M.D.   On: 11/03/2022 14:30   DG Abd Portable 1V-Small Bowel Obstruction Protocol-initial, 8 hr delay  Result Date: 11/03/2022 CLINICAL DATA:  Small-bowel obstruction EXAM: PORTABLE ABDOMEN - 1 VIEW COMPARISON:  CT 11/01/2022 FINDINGS: Oral contrast material appears to abut Mrs. There is diffuse small bowel dilatation, measuring up to 5.5-6.5 cm. Normal caliber colon with gaseous distension. Percutaneous drainage catheter noted in the left hemipelvis. IMPRESSION: Diffuse small bowel dilation compatible with small bowel obstruction. Colon is also distended to a degree, ileus is possible. Oral contrast noted in the stomach. Electronically Signed   By: Maurine Simmering M.D.   On: 11/03/2022 12:10   CT  ABDOMEN PELVIS W CONTRAST  Result Date: 11/01/2022 CLINICAL DATA:  Intra-abdominal abscess, follow-up examination EXAM: CT ABDOMEN AND PELVIS WITH CONTRAST TECHNIQUE: Multidetector CT imaging of the abdomen and pelvis was performed using the standard protocol following bolus administration of intravenous contrast. RADIATION DOSE REDUCTION: This exam was performed according to the departmental dose-optimization program which includes automated exposure control, adjustment of the mA and/or kV according to patient size and/or use of iterative reconstruction technique. CONTRAST:  169mL OMNIPAQUE IOHEXOL 350 MG/ML SOLN COMPARISON:  10/23/2022 FINDINGS: Lower chest: Small right pleural effusion. Mild bibasilar atelectasis. Cardiac size within normal limits.  Hepatobiliary: Vicarious excretion of contrast within the gallbladder which is otherwise unremarkable. Ill-defined hypodensity within the right hepatic lobe adjacent the gallbladder fossa is nonspecific, but is unchanged. No intra or extrahepatic biliary ductal dilation. No enhancing intrahepatic mass. Pancreas: Unremarkable Spleen: Unremarkable Adrenals/Urinary Tract: The adrenal glands are unremarkable. Punctate 2 mm nonobstructing calculus noted within the upper pole the right kidney. The kidneys are otherwise unremarkable. The bladder is largely decompressed. Infiltration is again seen superior to the bladder dome related to the adjacent inflammatory process, unchanged. Stomach/Bowel: Developing mid small bowel obstruction with a single point of transition identified within the mid abdomen at axial image # 54/3 with fluid distension of the proximal small bowel and decompression of the more distal small bowel and colon. No free intraperitoneal gas. Interval percutaneous drainage of the loculated inflammatory appearing fluid collection within the left adnexa with complete evacuation of the previously noted loculated fluid. There is extensive surrounding inflammatory change and poorly loculated fluid seen medial to the drainage catheter, unchanged from prior examination. Several small bowel loops adjacent to this inflammatory process demonstrate mild bowel wall thickening, likely reactive. New loculated rim enhancing fluid collection is seen within the right lower quadrant abutting several loops of a distal small bowel measuring 3.2 x 7.4 x 5.0 cm on coronal image # 89/7 and axial image # 68/3. Stable mild free fluid within the right pericolic gutter and within the cul-de-sac. Vascular/Lymphatic: No significant vascular findings are present. No enlarged abdominal or pelvic lymph nodes. Reproductive: As noted above, the loculated fluid collection in keeping with a tubo-ovarian abscess within the left adnexa has been  completely evacuated following placement of a left lower quadrant percutaneous drainage catheter. Extensive surrounding inflammatory changes and poorly loculated fluid within the right adnexa persist. Other: Moderate subcutaneous body wall edema. No abdominal wall hernia. Musculoskeletal: No acute bone abnormality. No lytic or blastic bone lesion. IMPRESSION: 1. Interval percutaneous drainage of the loculated inflammatory appearing fluid collection within the left adnexa with complete evacuation of the previously noted loculated fluid. Extensive surrounding inflammatory change and poorly loculated fluid within the right adnexa persist. 2. New loculated rim enhancing fluid collection within the right lower quadrant abutting several loops of a distal small bowel measuring 3.2 x 7.4 x 5.0 cm. 3. Developing mid small bowel obstruction with a single point of transition identified within the mid abdomen. 4. Minimal right nonobstructing nephrolithiasis. 5. Moderate subcutaneous body wall edema. Electronically Signed   By: Fidela Salisbury M.D.   On: 11/01/2022 23:20   [place] EKG SITE RITE  Result Date: 11/01/2022 If Site Rite image not attached, placement could not be confirmed due to current cardiac rhythm.   Scheduled Meds:  apixaban  2.5 mg Oral BID   carvedilol  3.125 mg Oral BID WC   Chlorhexidine Gluconate Cloth  6 each Topical Daily   insulin aspart  0-9 Units Subcutaneous Q4H  insulin glargine-yfgn  10 Units Subcutaneous Daily   levothyroxine  50 mcg Oral Q0600   pantoprazole (PROTONIX) IV  40 mg Intravenous Q24H   QUEtiapine  200 mg Oral QHS   sodium chloride flush  3 mL Intravenous Q12H   sodium chloride flush  5 mL Intracatheter Q8H   Continuous Infusions:  ampicillin-sulbactam (UNASYN) IV 3 g (11/03/22 1400)   lactated ringers 35 mL/hr at 11/03/22 0305   TPN ADULT (ION) 40 mL/hr at 11/03/22 0306   TPN ADULT (ION)      LOS: 10 days   Raiford Noble, DO [facility] Available via Epic  secure chat 7am-7pm After these hours, please refer to coverage provider listed on [email or web address] 11/03/2022, 4:17 PM

## 2022-11-03 NOTE — Progress Notes (Signed)
SLP Cancellation Note  Patient Details Name: NATSUKO DOWNES MRN: [id number] DOB: [birth year]   Cancelled treatment:       Reason Eval/Treat Not Completed: Medical issues which prohibited therapy (currently not medically cleared for POs). Will continue to follow.   Osie Bond., M.A. [place] Office [phone]  Secure chat preferred  11/03/2022, 7:58 AM

## 2022-11-03 NOTE — Progress Notes (Signed)
Pt continues refusal of NGT placement after second attempt and discussion- she wants to know if she can drink the 90 ml Gastrogaffin contrast after Zofran and take xray afterwards.called Xray- deferred to physician on call when able.

## 2022-11-03 NOTE — Procedures (Signed)
Interventional Radiology Procedure Note  Procedure: CT Guided Drainage of RLQ peritoneal fluid collection  Complications: None  Estimated Blood Loss: < 10 mL  Findings: 10 Fr drain placed in RLQ fluid collection with return of turbid yellow fluid. Fluid sample sent for culture analysis. Drain attached to suction bulb drainage.  Will follow.  Venetia Night. Kathlene Cote, M.D Pager:  [phone]

## 2022-11-03 NOTE — Progress Notes (Signed)
PHARMACY - TOTAL PARENTERAL NUTRITION CONSULT NOTE   Indication: Small bowel obstruction and prolonged inability to tolerate PO intake  Patient Measurements: Height: 5\' 8"  (172.7 cm) Weight: (!) 164.8 kg (363 lb 5.1 oz) IBW/kg (Calculated) : 63.9 TPN AdjBW (KG): 86.4 Body mass index is 55.24 kg/m.  Assessment:  52 yo female with PMH of Bell's palsy, DM, CHF, hypothyroidism, hx DVT on Eliquis. Recently discharged on 3/21 after admission for abdominal pain and intractable nausea/vomiting. Brought back to ED on 3/24 for AMS and loss of consciousness. Endorsed abdominal pain and nausea/vomiting on day of admission. Pharmacy has been consulted on 4/3 to begin TPN as patient now with evidence of SBO on imaging and prolonged poor PO intake.   Discussed with patient on 4/3, she states that she really has not been able to tolerate a meal since before her previous admission ~3/16. Has a few juice cups and water cup on her tray, but states she is really only able to drink liquids when she is taking meds. Intake causes her to experience nausea and abdominal pain. Patient stated that just thinking about food/liquid/PO intake during our discussion made her feel nauseous.   Glucose / Insulin: hx T2DM, last A1c 7.4% 05/2022, on U-500 100 units TID, metformin, Jardiance, and Ozempic PTA >> was started on Semglee 100 units BID here but reduced down to Semglee 10 units QD on 4/1 for low BG's secondary to poor PO intake. Also on sSSI.  - CBGs <180 - got 5 units of SSI Electrolytes: K 3.4, Mg 2, Phos 3 - Goal K >/= 4 and Mag >/= 2 Renal: Scr <1, BUN <5 Hepatic: TG 87, LFTs wnl Intake / Output: UOP 0.2 mL/kg/hr, +13L this admission, LBM 3/28 MIVF: LR 35 mL/hr GI Imaging: - 3/24 CTAP: inflammatory/infectious process such as PID w/ developing abscess, no bowel obstruction - 3/29 Abd X-ray: multiple dilated air-filled loops of small and large bowel, could represent ileus or developing obstruction - 4/2 CTAP: new  loculated rim enhancing fluid collection w/in the RLQ, several loops of distal small bowel, developing mid small bowel obstruction GI Surgeries / Procedures:  - 3/26: CT-guided left pelvic abscess drain placement  Central access: 4/3 TPN start date: 4/3  Nutritional Goals: Goal TPN rate is 90 mL/hr (provides 112 g of protein and 1988 kcals per day)  RD Assessment: Estimated Needs Total Energy Estimated Needs: 1900-2100 kcal/d Total Protein Estimated Needs: 100-120 g/d Total Fluid Estimated Needs: 2L/d  Current Nutrition:  NPO and TPN  Plan:  Increase TPN to 46mL/hr at 1800 Electrolytes in TPN: Na 13mEq/L, K 63mEq/L, Ca 26mEq/L, Mg 19mEq/L, and Phos 60mmol/L. Cl:Ac 1:1 Add standard MVI and trace elements to TPN Continue Sensitive q4h SSI and adjust as needed  Continue Semglee 10 units SQ QD for now D/c MIVF at 1800  Monitor TPN labs on Mon/Thurs, and daily until at goal rate Add thiamine 100mg  to TPN x 5 days for refeeding risk (start 4/3) Kcl 10 meq x 4 runs  .cap

## 2022-11-03 NOTE — Progress Notes (Signed)
Physical Therapy Treatment Patient Details Name: [name] MRN: [id number] DOB: [birth year] Today's Date: 11/03/2022   History of Present Illness Pt is a 52 y.o. F who presents 10/23/2022 with acute metabolic encephalopathy and severe sepsis secondary to intra-abdominal abscess. S/p drain placement 3/26. 3/30 x-ray showing SBO vs ileus. Significant PMH:  morbid obesity with BMI greater than 50, systolic/diastolic CHF with previous ejection fraction of 30 to 35% and grade 1 diastolic dysfunction, chronic right ventricular failure, history of DVT, hypothyroidism, depression and anxiety and chronic abdominal pain.    PT Comments    Pt received in bed, willing to get up to chair but before could transfer transport arrived to take pt to test. Pt needed min A to come to EOB. Worked on sit>stand from bed to bariatric RW. Pt needed vc's for sequencing and timing as well as min A +2. Pt performed pregait activities and sidestepping in standing. Her tolerance for being up is very limited by pain which she reports as 10/10 today. PT will continue to follow.    Recommendations for follow up therapy are one component of a multi-disciplinary discharge planning process, led by the attending physician.  Recommendations may be updated based on patient status, additional functional criteria and insurance authorization.  Follow Up Recommendations  Can patient physically be transported by private vehicle: No    Assistance Recommended at Discharge PRN  Patient can return home with the following Two people to help with walking and/or transfers;Two people to help with bathing/dressing/bathroom;Assistance with cooking/housework;Assist for transportation;Help with stairs or ramp for entrance   Equipment Recommendations  Hospital bed    Recommendations for Other Services       Precautions / Restrictions Precautions Precautions: Fall Precaution Comments: LLQ drain Restrictions Weight Bearing Restrictions: No      Mobility  Bed Mobility Overal bed mobility: Needs Assistance Bed Mobility: Supine to Sit, Sit to Supine     Supine to sit: Min assist, HOB elevated Sit to supine: Mod assist, HOB elevated   General bed mobility comments: pt needed mod instructional cues to sequence and use rail to come to sitting, min HHA for last 20% of trunk elevation. Min A to BLE's for return to supine    Transfers Overall transfer level: Needs assistance Equipment used: Rolling walker (2 wheels) Transfers: Sit to/from Stand Sit to Stand: From elevated surface, Min assist, +2 safety/equipment           General transfer comment: pt unable to stand on her own without cues. Gave pt a pattern to follow and min A given for power up and was then able to stand. Performed 3x from bed. Was planning to pivot to chair but xray arrived to take pt    Ambulation/Gait Ambulation/Gait assistance: Min assist, +2 safety/equipment Gait Distance (Feet): 3 Feet Assistive device: Rolling walker (2 wheels) (bari) Gait Pattern/deviations: Decreased step length - right, Decreased step length - left, Decreased stride length, Trunk flexed, Knees buckling Gait velocity: reduced Gait velocity interpretation: <1.31 ft/sec, indicative of household ambulator Pre-gait activities: tapping R and L foot fwd and bkwd, marching in place General Gait Details: took sidesteps along EOB 3', vc's for wt shifting and pushing down through RW when stepping   Chief Strategy Officer    Modified Rankin (Stroke Patients Only)       Balance Overall balance assessment: Needs assistance Sitting-balance support: Feet supported Sitting balance-Leahy Scale: Fair Sitting balance - Comments:  Prefers UE support   Standing balance support: Bilateral upper extremity supported Standing balance-Leahy Scale: Poor Standing balance comment: reliant on Bil UE support and up to modA to maintain balance                             Cognition Arousal/Alertness: Awake/alert Behavior During Therapy: Flat affect, Anxious Overall Cognitive Status: Within Functional Limits for tasks assessed                                 General Comments: WFL for basic conversation, able to relay specifics about how her stuomach feels. Slow processing noted, unsure of this is her baseline        Exercises      General Comments General comments (skin integrity, edema, etc.): pt relays feeling fearful in standing as well as having abdominal pain      Pertinent Vitals/Pain Pain Assessment Pain Assessment: 0-10 Pain Score: 10-Worst pain ever Pain Location: abdomen Pain Descriptors / Indicators: Discomfort, Grimacing, Guarding Pain Intervention(s): Premedicated before session, Limited activity within patient's tolerance    Home Living                          Prior Function            PT Goals (current goals can now be found in the care plan section) Acute Rehab PT Goals Patient Stated Goal: less pain PT Goal Formulation: With patient Time For Goal Achievement: 11/15/22 Potential to Achieve Goals: Fair Progress towards PT goals: Progressing toward goals    Frequency    Min 3X/week      PT Plan Current plan remains appropriate    Co-evaluation              AM-PAC PT "6 Clicks" Mobility   Outcome Measure  Help needed turning from your back to your side while in a flat bed without using bedrails?: A Lot Help needed moving from lying on your back to sitting on the side of a flat bed without using bedrails?: A Little Help needed moving to and from a bed to a chair (including a wheelchair)?: A Lot Help needed standing up from a chair using your arms (e.g., wheelchair or bedside chair)?: A Lot Help needed to walk in hospital room?: Total Help needed climbing 3-5 steps with a railing? : Total 6 Click Score: 11    End of Session Equipment Utilized During Treatment: Gait  belt Activity Tolerance: Patient limited by pain Patient left: in bed (transport present) Nurse Communication: Mobility status PT Visit Diagnosis: Muscle weakness (generalized) (M62.81);Difficulty in walking, not elsewhere classified (R26.2);Pain;Unsteadiness on feet (R26.81);Other abnormalities of gait and mobility (R26.89) Pain - part of body:  (abdomen)     Time: [id number] PT Time Calculation (min) (ACUTE ONLY): 24 min  Charges:  $Therapeutic Activity: 23-37 mins                     [place] chat preferred Office [place] 11/03/2022, 2:02 PM

## 2022-11-04 ENCOUNTER — Inpatient Hospital Stay (HOSPITAL_COMMUNITY): Payer: Medicaid Other

## 2022-11-04 DIAGNOSIS — I5022 Chronic systolic (congestive) heart failure: Secondary | ICD-10-CM | POA: Diagnosis not present

## 2022-11-04 DIAGNOSIS — G9340 Encephalopathy, unspecified: Secondary | ICD-10-CM | POA: Diagnosis not present

## 2022-11-04 DIAGNOSIS — M329 Systemic lupus erythematosus, unspecified: Secondary | ICD-10-CM | POA: Diagnosis not present

## 2022-11-04 DIAGNOSIS — K651 Peritoneal abscess: Secondary | ICD-10-CM | POA: Diagnosis not present

## 2022-11-04 LAB — COMPREHENSIVE METABOLIC PANEL
ALT: 11 U/L (ref 0–44)
AST: 14 U/L — ABNORMAL LOW (ref 15–41)
Albumin: 1.8 g/dL — ABNORMAL LOW (ref 3.5–5.0)
Alkaline Phosphatase: 102 U/L (ref 38–126)
Anion gap: 11 (ref 5–15)
BUN: 8 mg/dL (ref 6–20)
CO2: 24 mmol/L (ref 22–32)
Calcium: 8.3 mg/dL — ABNORMAL LOW (ref 8.9–10.3)
Chloride: 105 mmol/L (ref 98–111)
Creatinine, Ser: 0.67 mg/dL (ref 0.44–1.00)
GFR, Estimated: >60 mL/min (ref >60)
Glucose, Bld: 176 mg/dL — ABNORMAL HIGH (ref 70–99)
Potassium: 3.6 mmol/L (ref 3.5–5.1)
Sodium: 140 mmol/L (ref 135–145)
Total Bilirubin: 0.2 mg/dL — ABNORMAL LOW (ref 0.3–1.2)
Total Protein: 5.6 g/dL — ABNORMAL LOW (ref 6.5–8.1)

## 2022-11-04 LAB — CBC WITH DIFFERENTIAL/PLATELET
Abs Immature Granulocytes: 0.16 10*3/uL — ABNORMAL HIGH (ref 0.00–0.07)
Basophils Absolute: 0.1 10*3/uL (ref 0.0–0.1)
Basophils Relative: 0 %
Eosinophils Absolute: 0.5 10*3/uL (ref 0.0–0.5)
Eosinophils Relative: 4 %
HCT: 22.2 % — ABNORMAL LOW (ref 36.0–46.0)
Hemoglobin: 7.2 g/dL — ABNORMAL LOW (ref 12.0–15.0)
Immature Granulocytes: 1 %
Lymphocytes Relative: 19 %
Lymphs Abs: 2.5 10*3/uL (ref 0.7–4.0)
MCH: 30.8 pg (ref 26.0–34.0)
MCHC: 32.4 g/dL (ref 30.0–36.0)
MCV: 94.9 fL (ref 80.0–100.0)
Monocytes Absolute: 1.1 10*3/uL — ABNORMAL HIGH (ref 0.1–1.0)
Monocytes Relative: 9 %
Neutro Abs: 8.5 10*3/uL — ABNORMAL HIGH (ref 1.7–7.7)
Neutrophils Relative %: 67 %
Platelets: 437 10*3/uL — ABNORMAL HIGH (ref 150–400)
RBC: 2.34 MIL/uL — ABNORMAL LOW (ref 3.87–5.11)
RDW: 15.8 % — ABNORMAL HIGH (ref 11.5–15.5)
WBC: 12.8 10*3/uL — ABNORMAL HIGH (ref 4.0–10.5)
nRBC: 0 % (ref 0.0–0.2)

## 2022-11-04 LAB — GLUCOSE, CAPILLARY
Glucose-Capillary: 136 mg/dL — ABNORMAL HIGH (ref 70–99)
Glucose-Capillary: 146 mg/dL — ABNORMAL HIGH (ref 70–99)
Glucose-Capillary: 153 mg/dL — ABNORMAL HIGH (ref 70–99)
Glucose-Capillary: 155 mg/dL — ABNORMAL HIGH (ref 70–99)
Glucose-Capillary: 167 mg/dL — ABNORMAL HIGH (ref 70–99)
Glucose-Capillary: 173 mg/dL — ABNORMAL HIGH (ref 70–99)

## 2022-11-04 LAB — MAGNESIUM: Magnesium: 1.7 mg/dL (ref 1.7–2.4)

## 2022-11-04 LAB — PHOSPHORUS: Phosphorus: 3.3 mg/dL (ref 2.5–4.6)

## 2022-11-04 MED ORDER — BISACODYL 10 MG RE SUPP: 10.0000 mg | Freq: Every day | RECTAL | Status: DC | PRN

## 2022-11-04 MED ORDER — POTASSIUM CHLORIDE 10 MEQ/100ML IV SOLN
10.0000 meq | INTRAVENOUS | Status: AC
  Administered 2022-11-04 (×6): 10 meq via INTRAVENOUS
  Filled 2022-11-04 (×6): qty 100

## 2022-11-04 MED ORDER — TRAVASOL 10 % IV SOLN
INTRAVENOUS | Status: AC
  Filled 2022-11-04: qty 1123.2

## 2022-11-04 MED ORDER — LEVOTHYROXINE SODIUM 100 MCG/5ML IV SOLN: 40.0000 ug | Freq: Every day | INTRAVENOUS | Status: DC

## 2022-11-04 MED ORDER — ENOXAPARIN SODIUM 120 MG/0.8ML IJ SOSY
120.0000 mg | PREFILLED_SYRINGE | Freq: Two times a day (BID) | INTRAMUSCULAR | Status: DC
  Administered 2022-11-04 – 2022-11-09 (×10): 120 mg via SUBCUTANEOUS
  Filled 2022-11-04 (×13): qty 0.8

## 2022-11-04 NOTE — Progress Notes (Signed)
ANTICOAGULATION CONSULT NOTE - Initial Consult  Pharmacy Consult for Enoxaparin Indication: pulmonary embolus  Allergies  Allergen Reactions   Metoclopramide Other (See Comments)    Developed restless leg, akathisia type limb movements.    Codeine Itching and Rash   Hydrocodone Rash   Percocet [Oxycodone-Acetaminophen] Hives and Rash    Tolerates dilaudid Tolerates acetaminophen     Patient Measurements: Height: 5\' 8"  (172.7 cm) Weight: (!) 164.8 kg (363 lb 5.1 oz) IBW/kg (Calculated) : 63.9 Adjusted body weight: 104kg  Vital Signs: Temp: 98.4 F (36.9 C) (04/05 1500) Temp Source: Oral (04/05 1500) BP: 148/75 (04/05 1134) Pulse Rate: 86 (04/05 1134)  Labs: Recent Labs    11/02/22 1053 11/03/22 0241 11/03/22 1700 11/04/22 0755 11/04/22 1130  HGB 7.9*  --  7.4* 7.2*  --   HCT 22.9*  --  22.8* 22.2*  --   PLT 473*  --  480* 437*  --   CREATININE 0.73 0.64  --   --  0.67    Estimated Creatinine Clearance: 135.4 mL/min (by C-G formula based on SCr of 0.67 mg/dL).   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    Eczema    Family history of breast cancer    Family history of malignant neoplasm of breast    Family history of malignant neoplasm of ovary    Family history of ovarian cancer    Family history of prostate cancer    GERD (gastroesophageal reflux disease)    High cholesterol    Hypertension    Liver lesion 08/13/2019   Liver mass 2014   biopsied 03/2013 at [facility], not malignant.  is to undergo radiologic ablation of the mass in sept/October 2014.    Lupus    Migraines    "maybe a couple times/yr" (04/01/2013)   Obesity    Obstructive sleep apnea on CPAP 2011   Oxygen at nights   Pulmonary embolism 05/21/2021   SJOGREN'S SYNDROME 08/09/2010   SLE (systemic lupus erythematosus)    Hattie Perch 12/01/2010   Assessment: 52 YOF currently NPO on TPN for prolonged ileus on Eliquis PTA for history of PE in 2022. Pharmacy consulted to dose enoxaparin for treatment while NPO.   CrCl >120 mL/min AdjBW 104kg  Goal of Therapy:  Anti-Xa level 0.6-1 units/ml 4hrs after LMWH dose given Monitor platelets by anticoagulation protocol: Yes   Plan:  Start enoxaparin 120mg  Q12H (reduced for AdjBW) Recommend close Anti-Xa levels given BMI Monitor for hematomas around injection site and worsening abdominal pain. Hoping to avoid IV heparin and additional fluid by utilizing enoxaparin.  Ellis Savage, PharmD Clinical Pharmacist 11/04/2022,8:02 PM

## 2022-11-04 NOTE — Progress Notes (Addendum)
PHARMACY - TOTAL PARENTERAL NUTRITION CONSULT NOTE   Indication: Small bowel obstruction and prolonged inability to tolerate PO intake  Patient Measurements: Height: 5\' 8"  (172.7 cm) Weight: (!) 164.8 kg (363 lb 5.1 oz) IBW/kg (Calculated) : 63.9 TPN AdjBW (KG): 86.4 Body mass index is 55.24 kg/m.  Assessment:  52 yo female with PMH of Bell's palsy, DM, CHF, hypothyroidism, hx DVT on Eliquis. Recently discharged on 3/21 after admission for abdominal pain and intractable nausea/vomiting. Brought back to ED on 3/24 for AMS and loss of consciousness. Endorsed abdominal pain and nausea/vomiting on day of admission. Pharmacy has been consulted on 4/3 to begin TPN as patient now with evidence of SBO on imaging and prolonged poor PO intake.   Discussed with patient on 4/3, she states that she really has not been able to tolerate a meal since before her previous admission ~3/16. Has a few juice cups and water cup on her tray, but states she is really only able to drink liquids when she is taking meds. Intake causes her to experience nausea and abdominal pain. Patient stated that just thinking about food/liquid/PO intake during our discussion made her feel nauseous.   Glucose / Insulin: hx T2DM, last A1c 7.4% 05/2022, on U-500 100 units TID, metformin, Jardiance, and Ozempic PTA >> was started on Semglee 100 units BID here but reduced down to Semglee 10 units QD on 4/1 for low BG's secondary to poor PO intake. Also on sSSI.  - CBGs <180 - got 7 units of SSI/24h Electrolytes: 4/4: K 3.4, Mg 2, Phos 3 - Goal K >/= 4 and Mag >/= 2 Renal: Scr <1, BUN <5 Hepatic: TG 87, LFTs wnl Intake / Output: UOP 0.2 mL/kg/hr, +13L this admission, LBM 4/2, drain output 20 mL MIVF: none GI Imaging: - 3/24 CTAP: inflammatory/infectious process such as PID w/ developing abscess, no bowel obstruction - 3/29 Abd X-ray: multiple dilated air-filled loops of small and large bowel, could represent ileus or developing  obstruction - 4/2 CTAP: new loculated rim enhancing fluid collection w/in the RLQ, several loops of distal small bowel, developing mid small bowel obstruction - 4/5 Abd x-ray: persistent dilated loops of small bowel which may reflect ileus or pSBO GI Surgeries / Procedures:  - 3/26: CT-guided left pelvic abscess drain placement  Central access: 4/3 TPN start date: 4/3  Nutritional Goals: Goal TPN rate is 90 mL/hr (provides 112 g of protein and 1988 kcals per day)  RD Assessment: Estimated Needs Total Energy Estimated Needs: 1900-2100 kcal/d Total Protein Estimated Needs: 100-120 g/d Total Fluid Estimated Needs: 2L/d  Current Nutrition:  NPO and TPN  Plan:  Labs drawn this morning had to be re-drawn due to contaminated sample. Re-ordered as stat. Due to approaching TPN cutoff time for orders, will re-order same TPN formula as yesterday and replete electrolytes as needed outside of the bag.   Continue TPN at 64mL/hr at 1800 Electrolytes in TPN: Na 41mEq/L, K 63mEq/L, Ca 26mEq/L, Mg 32mEq/L, and Phos 74mmol/L. Cl:Ac 1:1 Add standard MVI and trace elements to TPN Continue Sensitive q4h SSI and adjust as needed  Continue Semglee 10 units SQ QD for now MIVF d/c with TPN at goal rate - further MIVF per MD Monitor TPN labs on Mon/Thurs, and daily until at goal rate Add thiamine 100mg  to TPN x 5 days for refeeding risk (start 4/3)  Rexford Maus, PharmD, BCPS 11/04/2022 11:15 AM  PM UPDATE: - K low 3.4 (got 2 K runs IV and increase in TPN  for K 3.2 yesterday) - Kcl x 6 runs  Rexford Maus, PharmD, BCPS 11/04/2022 12:30 PM

## 2022-11-04 NOTE — Progress Notes (Signed)
[facility]  Date of Admission:  10/23/2022   Total days of inpatient antibiotics 9  Principal Problem:   Acute encephalopathy Active Problems:   SJOGREN'S SYNDROME   Hypertension complicating diabetes   Chronic systolic CHF (congestive heart failure)   QT prolongation   Type 2 diabetes mellitus with hyperlipidemia   History of CVA (cerebrovascular accident)   Major depressive disorder with single episode, in partial remission   SLE (systemic lupus erythematosus related syndrome)   PID (acute pelvic inflammatory disease)          Assessment: 52 year old female with pelvic abscess on immunosuppression   # Left tubo-ovarian abscess status post CT-guided drainage with drain placement #New loculated rim-enhancing collection in right lower quadrant that is post drain placement on 4/4 #Leukocytosis in the setting of new loculated rim-enhancing collection #Persistent ileus-started TPN - 3/24 CT abdomen pelvis with contrast showed diffuse inflammatory changes and pelvic stranding, new since prior CT on 3/15.  Concern for PID with developing abscess(5x5cm), small amount of hemorrhagic content may be present pelvis possibility of hemorrhagic cyst that is ruptured.  - Patient underwent CT-guided drainage with IR on 3/26,  left tubo-ovarian fluid collection was localized, pigtail catheter placed with 60 mL fluid aspirated.  Cultures grew strep anginosus, Prevotella bivia. Patient has been transitioned to Unasyn - Xray on 3/30 showed small bowel dilatation suggesting SBO vs ileus -Repeat CT abdomen pelvis on 4/2 showed new loculated rim-enhancing fluid collection in right lower quadrant abusing several loops of distal small bowel measuring 3.2 X7.4X 5 cm.  Developing mild mid small bowel obstruction with a single point of transition.  Percutaneous drainage of the left adnexa with complete evacuation. - Radiology placed on 4/4 right lower quadrant drain, turbid  yellow fluid retrieved culture sent. Recommendations: - Follow aspirate cultures from 11/03/2022, engage infectious disease if there is new data.  ID will sign off -Continue Unasyn, monitor clinically.  Patient currently getting TPN.  Can transition to Augmentin 875/125 mg p.o. twice daily when able to tolerate p.o.  Plan on 4 weeks of antibiotics at least from aspiration on 4/4.  I gave her infectious disease follow-up with myself on 4/18.  Ultimate antibiotic plan based on radiographic and clinical progression. -Leukocytosis trending down.  I suspect new loculated rim-enhancing collection is likely in the setting of SBO.   -Hold Benlysta/Imuran - General surgery following for SBO/ileus.  Patient n.p.o. on TPN.  May attempt NGT if abdominal pain worsens.   #History of recurrent nausea and vomiting since at least 2014, multiple hospitalizations --Of note patient has had multiple hospitalizations for nausea and vomiting, etiology seems to be unclear.  Looks like during 3/16 hospitalization patient had declined upper endoscopy.  She has had 3 EGDs previously, most recent was less than 2 years ago.  Suspicion for any significant pathology or malignancy was extremely low per GIs note on 3/19.     #Lupus(+ ANA, + SSA, SICCA symptoms, alopecia, oral ulcers, arthralgia) on immunosuppression -I spoke with Dr. Artis FlockWolfe, hold imuran till 2 weeks following discharge. Plan to follow with rheumatology in 6 to 8 weeks following discharge, at that point consider restarting Benlysta.     Microbiology:   Antibiotics: Fluconazole 3/26-p Doxy 3/24-p Augmentin 3/31-p Ceftriaxone 3/24-3/25 Metro 3/24-3/26 Merrem 3/26-3/31     Cultures: Blood 3/26 NG   Other 3/26 strep anginosus and prevotella bivia  4/4 /24 NG Urine  3/31 <10k colonies   SUBJECTIVE: Resting  in bed.no new complaints.  Interval: Afebrile overnight. Wbc 12K  Review of Systems: Review of Systems  All other systems reviewed and are  negative.    Scheduled Meds:  apixaban  2.5 mg Oral BID   carvedilol  3.125 mg Oral BID WC   Chlorhexidine Gluconate Cloth  6 each Topical Daily   insulin aspart  0-9 Units Subcutaneous Q4H   insulin glargine-yfgn  10 Units Subcutaneous Daily   levothyroxine  50 mcg Oral Q0600   pantoprazole (PROTONIX) IV  40 mg Intravenous Q24H   QUEtiapine  200 mg Oral QHS   sodium chloride flush  3 mL Intravenous Q12H   sodium chloride flush  5 mL Intracatheter Q8H   Continuous Infusions:  ampicillin-sulbactam (UNASYN) IV 3 g (11/04/22 1228)   potassium chloride     TPN ADULT (ION) 90 mL/hr at 11/03/22 1721   TPN ADULT (ION)     PRN Meds:.acetaminophen **OR** acetaminophen, bisacodyl, HYDROmorphone (DILAUDID) injection, hydrOXYzine, ondansetron (ZOFRAN) IV, senna-docusate, sodium chloride flush Allergies  Allergen Reactions   Metoclopramide Other (See Comments)    Developed restless leg, akathisia type limb movements.    Codeine Itching and Rash   Hydrocodone Rash   Percocet [Oxycodone-Acetaminophen] Hives and Rash    Tolerates dilaudid Tolerates acetaminophen     OBJECTIVE: Vitals:   11/04/22 0234 11/04/22 0404 11/04/22 0833 11/04/22 1134  BP: (!) 127/59  (!) 154/72 (!) 148/75  Pulse: 96 86 94 86  Resp: 18 11 13 18   Temp: 98.1 F (36.7 C) 98.3 F (36.8 C)  98.2 F (36.8 C)  TempSrc: Oral Oral  Oral  SpO2: 98% 100% 100% 100%  Weight:      Height:       Body mass index is 55.24 kg/m.  Physical Exam Constitutional:      Appearance: Normal appearance.  HENT:     Head: Normocephalic and atraumatic.     Right Ear: Tympanic membrane normal.     Left Ear: Tympanic membrane normal.     Nose: Nose normal.     Mouth/Throat:     Mouth: Mucous membranes are moist.  Eyes:     Extraocular Movements: Extraocular movements intact.     Conjunctiva/sclera: Conjunctivae normal.     Pupils: Pupils are equal, round, and reactive to light.  Cardiovascular:     Rate and Rhythm: Normal  rate and regular rhythm.     Heart sounds: No murmur heard.    No friction rub. No gallop.  Pulmonary:     Effort: Pulmonary effort is normal.     Breath sounds: Normal breath sounds.  Abdominal:     Palpations: Abdomen is soft.     Comments: R and L quadrant drain  Skin:    General: Skin is warm and dry.  Neurological:     General: No focal deficit present.     Mental Status: She is alert and oriented to person, place, and time.  Psychiatric:        Mood and Affect: Mood normal.       Lab Results Lab Results  Component Value Date   WBC 12.8 (H) 11/04/2022   HGB 7.2 (L) 11/04/2022   HCT 22.2 (L) 11/04/2022   MCV 94.9 11/04/2022   PLT 437 (H) 11/04/2022    Lab Results  Component Value Date   CREATININE 0.67 11/04/2022   BUN 8 11/04/2022   NA 140 11/04/2022   K 3.6 11/04/2022   CL 105 11/04/2022   CO2 24  11/04/2022    Lab Results  Component Value Date   ALT 11 11/04/2022   AST 14 (L) 11/04/2022   ALKPHOS 102 11/04/2022   BILITOT 0.2 (L) 11/04/2022        Danelle Earthly, MD [facility] 11/04/2022, 1:20 PM

## 2022-11-04 NOTE — Progress Notes (Signed)
SLP Cancellation Note  Patient Details Name: [name] MRN: [id number] DOB: 1970-12-22   Cancelled treatment:       Reason Eval/Treat Not Completed: Medical issues which prohibited therapy (Pt remains NPO per surgery's recommendation. SLP will follow up subsequently.)  Jasminne Mealy I. Vear Clock, MS, CCC-SLP Acute Rehabilitation Services Office number [phone]  Scheryl Marten 11/04/2022, 10:21 AM

## 2022-11-04 NOTE — Progress Notes (Signed)
Occupational Therapy Treatment Patient Details Name: [name] MRN: [id number] DOB: 03-07-1971 Today's Date: 11/04/2022   History of present illness Pt is a 52 y.o. F who presents 10/23/2022 with acute metabolic encephalopathy and severe sepsis secondary to intra-abdominal abscess. S/p drain placement 3/26. 3/30 x-ray showing SBO vs ileus. Significant PMH:  morbid obesity with BMI greater than 50, systolic/diastolic CHF with previous ejection fraction of 30 to 35% and grade 1 diastolic dysfunction, chronic right ventricular failure, history of DVT, hypothyroidism, depression and anxiety and chronic abdominal pain.   OT comments  Patient continues to make steady progress towards goals in skilled OT session. Patient's session encompassed increasing sitting tolerance sitting EOB and applying lotion to promote weight shifting and an increase of activity. Patient min guard to get to EOB and improved ability to reach to bilateral shins with extra time. Patient advocating that she wants to try and pivot to [facility] to promote increased ability to complete tasks independently in future sessions. OT will continue to follow acutely.    Recommendations for follow up therapy are one component of a multi-disciplinary discharge planning process, led by the attending physician.  Recommendations may be updated based on patient status, additional functional criteria and insurance authorization.    Assistance Recommended at Discharge Frequent or constant Supervision/Assistance  Patient can return home with the following  A lot of help with walking and/or transfers;A lot of help with bathing/dressing/bathroom;Assistance with cooking/housework;Assist for transportation;Help with stairs or ramp for entrance;Direct supervision/assist for financial management;Direct supervision/assist for medications management   Equipment Recommendations  Other (comment) (Defer to next venue)    Recommendations for Other Services       Precautions / Restrictions Precautions Precautions: Fall Precaution Comments: LLQ drain and RLQ drain Restrictions Weight Bearing Restrictions: No       Mobility Bed Mobility Overal bed mobility: Needs Assistance Bed Mobility: Supine to Sit     Supine to sit: HOB elevated, Min guard     General bed mobility comments: extra time but patient able to complete without assist    Transfers Overall transfer level: Needs assistance                       Balance Overall balance assessment: Needs assistance Sitting-balance support: Feet supported Sitting balance-Leahy Scale: Fair Sitting balance - Comments: Prefers UE support   Standing balance support: Bilateral upper extremity supported Standing balance-Leahy Scale: Poor Standing balance comment: reliant on Bil UE support and up to modA to maintain balance                           ADL either performed or assessed with clinical judgement   ADL Overall ADL's : Needs assistance/impaired     Grooming: Wash/dry hands;Wash/dry face;Sitting;Set up       Lower Body Bathing: Moderate assistance;Sitting/lateral leans Lower Body Bathing Details (indicate cue type and reason): applying lotion to simulate and work on weight shifting                     Functional mobility during ADLs: Moderate assistance;Cueing for safety;Cueing for sequencing General ADL Comments: Session focus on increasing sitting tolerance sitting EOB and applying lotion to promote weight shifting and an increase of activity    Extremity/Trunk Assessment              Vision       Perception     Praxis  Cognition Arousal/Alertness: Awake/alert Behavior During Therapy: Flat affect, Anxious Overall Cognitive Status: Within Functional Limits for tasks assessed                                 General Comments: WFL, anxious about being in the hospital as a number of family members have died recently,  receptive to activity to keep mind occupied        Exercises      Shoulder Instructions       General Comments patient advocates she wants to try sitting on BSC to promote increased activity    Pertinent Vitals/ Pain       Pain Assessment Pain Assessment: Faces Faces Pain Scale: Hurts even more Pain Location: abdomen Pain Descriptors / Indicators: Discomfort, Grimacing, Guarding Pain Intervention(s): Limited activity within patient's tolerance, Monitored during session, Repositioned  Home Living                                          Prior Functioning/Environment              Frequency  Min 2X/week        Progress Toward Goals  OT Goals(current goals can now be found in the care plan section)  Progress towards OT goals: Progressing toward goals  Acute Rehab OT Goals Patient Stated Goal: to get to the [facility] by myself OT Goal Formulation: With patient Time For Goal Achievement: 11/15/22 Potential to Achieve Goals: Fair ADL Goals Pt Will Perform Grooming: sitting;with modified independence  Plan Discharge plan remains appropriate    Co-evaluation                 AM-PAC OT "6 Clicks" Daily Activity     Outcome Measure   Help from another person eating meals?: A Little Help from another person taking care of personal grooming?: A Little Help from another person toileting, which includes using toliet, bedpan, or urinal?: Total Help from another person bathing (including washing, rinsing, drying)?: A Lot Help from another person to put on and taking off regular upper body clothing?: A Lot Help from another person to put on and taking off regular lower body clothing?: Total 6 Click Score: 12    End of Session    OT Visit Diagnosis: Other abnormalities of gait and mobility (R26.89);Pain;Muscle weakness (generalized) (M62.81) Pain - Right/Left:  (Abdomen) Pain - part of body:  (Abdomen)   Activity Tolerance Patient limited by  fatigue;Patient limited by pain   Patient Left in bed;with call bell/phone within reach;with bed alarm set (sitting EOB)   Nurse Communication Mobility status        Time: 6010-9323 OT Time Calculation (min): 35 min  Charges: OT General Charges $OT Visit: 1 Visit OT Treatments $Self Care/Home Management : 23-37 mins  Pollyann Glen E. Shi Blankenship, OTR/L Acute Rehabilitation Services [phone]   Cherlyn Cushing 11/04/2022, 3:54 PM

## 2022-11-04 NOTE — Progress Notes (Signed)
Mobility Specialist Progress Note   11/04/22 1110  Mobility  Activity Dangled on edge of bed  Level of Assistance Minimal assist, patient does 75% or more  Assistive Device Other (Comment) (HHA)  Range of Motion/Exercises Right arm;Left arm  Activity Response Tolerated well  Mobility Referral Yes  $Mobility charge 1 Mobility   Pre Mobility: 90 HR, 97% SpO2 During Mobility: 95 HR Post Mobility: 93 HR, 148/75 BP, 95% SpO2  Received in bed c/o pain at new drain site(RLQ) but agreeable to mobility. Able to get EOB w/ modA, pt limited by pain and requiring increased time for UE support to bring trunk upright.  Performed seated weight shifts and x5 bouts of trunk flexion. Pt becoming lightheaded and requesting to lay back down, BP taken and VSS. MinA to get LE's back in bed, call bell placed in reach and bed alarm on.  Frederico Hamman Mobility Specialist Please contact via SecureChat or  Rehab office at [phone]

## 2022-11-04 NOTE — Progress Notes (Signed)
Referring Physician(s): Dr. Lynden Oxford   Supervising Physician: Marliss Coots  Patient Status:  [facility] - In-pt  Chief Complaint:  F/U Drains  Brief History:  [name] is a 52yo female with Lupus (on chronic immunosuppressive), systolic/diastolic CHF with previous ejection fraction of 30 to 35% and grade 1 diastolic dysfunction, chronic right ventricular failure, history of DVT, hypothyroidism, depression and anxiety and chronic abdominal pain.  She was admitted to [facility] 3/24 with altered mental status.   Work up included CT a/p which showed diffuse inflammatory changes and stranding of the pelvic fat, possibly representing an inflammatory/infectious process s  She was admitted and started on broad spectrum IV antibiotics.   She underwent a drain placement on 10/25/22 by Dr. Deanne Coffer for a left tubo-ovarian fluid collection yielding 60cc of purulent material on 3/26.   Culture = Strep anginosis, GBS, prevotella bivia. ID was consulted for antibiotic recommendations.   She continued to have abdominal pain, nausea, and vomiting.   CT a/p was repeated yesterday and showed interval percutaneous drainage of the loculated inflammatory appearing fluid collection within the left adnexa with complete evacuation of the previously noted loculated fluid and extensive surrounding inflammatory change and poorly loculated fluid within the right adnexa persists.   New loculated rim enhancing fluid collection within the right lower quadrant abutting several loops of a distal small bowel measuring 3.2 x 7.4 x 5.0 cm;  She underwent additional drain placement yesterday on the right  Subjective:  Sitting on side of bed, working with PT  Allergies: Metoclopramide, Codeine, Hydrocodone, and Percocet [oxycodone-acetaminophen]  Medications: Prior to Admission medications   Medication Sig Start Date End Date Taking? Authorizing Provider  ADVAIR HFA 230-21 MCG/ACT inhaler Inhale 2 puffs into  the lungs 2 (two) times daily. 12/20/20   [provider]  albuterol (PROVENTIL) (2.5 MG/3ML) 0.083% nebulizer solution Take 2.5 mg by nebulization as needed for wheezing.    [provider]  ALPRAZolam Prudy Feeler) 0.5 MG tablet Take 0.5 mg by mouth as needed for anxiety or sleep. 04/19/17   [provider]  apixaban (ELIQUIS) 2.5 MG TABS tablet Take 1 tablet (2.5 mg total) by mouth 2 (two) times daily. 09/21/22   Yates Decamp, MD  ARIPiprazole (ABILIFY) 5 MG tablet  01/14/21   [provider]  Azathioprine 75 MG TABS Take 150 mg by mouth 2 (two) times daily. 02/04/21   [provider]  B-D UF III MINI PEN NEEDLES 31G X 5 MM MISC USE AS DIRECTED TWICE A DAY 10/31/18   Romero Belling, MD  Belimumab (BENLYSTA) 200 MG/ML SOAJ Inject 200 mg into the skin once a week. Thursdays 12/19/19   [provider]  buPROPion (WELLBUTRIN XL) 300 MG 24 hr tablet Take 300 mg by mouth daily. 03/14/22   [provider]  carvedilol (COREG) 25 MG tablet Take 1.5 tablets (37.5 mg total) by mouth 2 (two) times daily with a meal. 01/01/21   Yates Decamp, MD  Continuous Blood Gluc Sensor (FREESTYLE LIBRE 3 SENSOR) MISC 1 Device by Does not apply route every 14 (fourteen) days. Apply 1 sensor on upper arm every 14 days for continuous glucose monitoring 05/25/22   Reather Littler, MD  cromolyn (OPTICROM) 4 % ophthalmic solution Place 1 drop into both eyes 2 (two) times daily. 01/14/21   [provider]  cycloSPORINE (RESTASIS) 0.05 % ophthalmic emulsion Place 1 drop into both eyes in the morning, at noon, and at bedtime.     [provider]  diclofenac Sodium (VOLTAREN) 1 % GEL APPLY 2 GRAMS TO AFFECTED AREA 4 TIMES A DAY Patient taking differently: Apply 2 g topically 4 (four) times daily as needed (pain). 01/11/22   Cantwell, Celeste C, PA-C  EASY TOUCH PEN NEEDLES 32G X 4 MM MISC USE TO INJECT INSULIN TWICE DAILY 08/07/19   Romero BellingEllison, Sean, MD  famotidine (PEPCID) 20 MG  tablet Take 20 mg by mouth 2 (two) times daily.    [provider]  fluticasone (FLONASE) 50 MCG/ACT nasal spray Place 2 sprays into both nostrils continuous as needed for allergies or rhinitis.    [provider]  glucose blood (ACCU-CHEK GUIDE) test strip 1 each by Other route 2 (two) times daily. And lancets 2/day 06/18/21   Romero BellingEllison, Sean, MD  glucose blood [facility](ONETOUCH ULTRA) test strip Test blood sugar 4 times a day 03/30/22   Reather LittlerKumar, Ajay, MD  hydroxychloroquine (PLAQUENIL) 200 MG tablet Take 200 mg by mouth 2 (two) times daily.    [provider]  ibuprofen (ADVIL) 600 MG tablet Take 1 tablet (600 mg total) by mouth every 6 (six) hours as needed. Patient taking differently: Take 600 mg by mouth every 6 (six) hours as needed for mild pain. 10/14/22   Roxy HorsemanBrowning, Robert, PA-C  insulin regular human CONCENTRATED (HUMULIN R U-500 KWIKPEN) 500 UNIT/ML KwikPen 100 UNITS, 30 MINUTES BEFORE EACH MEAL Patient taking differently: 100 Units 3 (three) times daily with meals. 100 Units, 30 minutes before each meal 08/16/22   Reather LittlerKumar, Ajay, MD  Insulin Syringe-Needle U-100 (EASY TOUCH INSULIN SYRINGE) 31G X 5/16" 1 ML MISC USE AS DIRECTED THREE TIMES A DAY 10/12/18   Romero BellingEllison, Sean, MD  JARDIANCE 10 MG TABS tablet TAKE 1 TABLET (10 MG TOTAL) BY MOUTH DAILY WITH BREAKFAST. 08/18/22   Reather LittlerKumar, Ajay, MD  Lancets [facility](ONETOUCH ULTRASOFT) lancets Test blood sugar 4 times a day 03/30/22   Reather LittlerKumar, Ajay, MD  levothyroxine (SYNTHROID) 50 MCG tablet Take 1 tablet (50 mcg total) by mouth daily at 6 (six) AM. 08/30/19   Marguerita MerlesSheikh, Omair Latif, DO  megestrol (MEGACE) 40 MG tablet Take 40 mg by mouth daily. 01/30/15   [provider]  metFORMIN (GLUCOPHAGE-XR) 500 MG 24 hr tablet Take 2 tablets (1,000 mg total) by mouth 2 (two) times daily. 02/02/21   Romero BellingEllison, Sean, MD  Multiple Vitamin (MULTIVITAMIN WITH MINERALS) TABS tablet Take 1 tablet by mouth daily. 08/30/19   Marguerita MerlesSheikh, Omair Latif, DO  ondansetron (ZOFRAN-ODT)  4 MG disintegrating tablet Take 1 tablet (4 mg total) by mouth every 8 (eight) hours as needed for nausea or vomiting. 10/14/22   Roxy HorsemanBrowning, Robert, PA-C  oxybutynin (DITROPAN-XL) 5 MG 24 hr tablet Take 5 mg by mouth daily. 04/25/22   [provider]  pregabalin (LYRICA) 75 MG capsule Take 75 mg 3 (three) times daily by mouth.  01/28/15   [provider]  prochlorperazine (COMPAZINE) 10 MG tablet Take 1 tablet (10 mg total) by mouth every 6 (six) hours as needed for nausea or vomiting. 05/14/22   Osvaldo ShipperKrishnan, Gokul, MD  promethazine (PHENERGAN) 25 MG suppository Place 1 suppository (25 mg total) rectally every 6 (six) hours as needed for nausea or vomiting. 10/21/22   Chestine Sporelark, Meghan R, PA-C  QUEtiapine (SEROQUEL) 300 MG tablet Take 300 mg by mouth at bedtime. 01/08/21   [provider]  rosuvastatin (CRESTOR) 20 MG tablet Take 1 tablet (20 mg total) by mouth daily. 06/09/21 10/16/22  Cantwell, Celeste C, PA-C  Semaglutide, 2 MG/DOSE, 8  MG/3ML SOPN Inject 2 mg as directed once a week. 08/18/22   Reather Littler, MD  sucralfate (CARAFATE) 1 GM/10ML suspension Take 10 mLs by mouth 2 (two) times daily as needed (abdomen pain). 12/09/20   [provider]  topiramate (TOPAMAX) 25 MG tablet Take 75 mg by mouth at bedtime. 03/17/16   [provider]  torsemide (DEMADEX) 20 MG tablet Take 1 tablet (20 mg total) by mouth at bedtime. 06/09/21   Cantwell, Celeste C, PA-C  TRANSDERM-SCOP 1 MG/3DAYS Place 1 patch (1.5 mg total) onto the skin every 3 (three) days. As needed for nausea 10/21/22   Melton Alar R, PA-C  Vitamin D, Ergocalciferol, (DRISDOL) 1.25 MG (50000 UNIT) CAPS capsule Take 50,000 Units by mouth once a week. 09/20/22   [provider]     Vital Signs: BP (!) 148/75 (BP Location: Right Leg)   Pulse 86   Temp 98.2 F (36.8 C) (Oral)   Resp 18   Ht 5\' 8"  (1.727 m)   Wt (!) 363 lb 5.1 oz (164.8 kg)   LMP 03/24/2017   SpO2 100%   BMI 55.24 kg/m   Physical  Exam Awake, sitting on side of the bed. Drain Location: Left Lower Quadrant and Right upper Quadrant. Size: Fr size: 10 Fr Date of placement: 10/25/22 and 11/03/22 Currently to: bag (L) and bulb (R) 24 hour output:  Output by Drain (mL) 11/02/22 0701 - 11/02/22 1900 11/02/22 1901 - 11/03/22 0700 11/03/22 0701 - 11/03/22 1900 11/03/22 1901 - 11/04/22 0700 11/04/22 0701 - 11/04/22 1421  Closed System Drain 1 Left LLQ Other (Comment) 12 Fr. 5  10 5    Closed System Drain 2 RUQ Bulb (JP) 10 Fr.    15     Current examination: Flushes/aspirates easily.  Serosanguinous fluid in left bag Clear serous fluid in right bulb Insertion sites unremarkable. Suture and stat lock in place. Dressed appropriately.    Labs:  CBC: Recent Labs    11/01/22 1146 11/02/22 1053 11/03/22 1700 11/04/22 0755  WBC 22.3* 18.3* 15.4* 12.8*  HGB 8.0* 7.9* 7.4* 7.2*  HCT 23.3* 22.9* 22.8* 22.2*  PLT 456* 473* 480* 437*    COAGS: Recent Labs    10/23/22 2110  INR 1.2  APTT 26    BMP: Recent Labs    11/01/22 0532 11/02/22 1053 11/03/22 0241 11/04/22 1130  NA 137 138 134* 140  K 3.6 3.2* 3.4* 3.6  CL 106 105 107 105  CO2 23 23 23 24   GLUCOSE 130* 130* 133* 176*  BUN 7 <5* 5* 8  CALCIUM 8.4* 8.5* 7.9* 8.3*  CREATININE 0.80 0.73 0.64 0.67  GFRNONAA >60 >60 >60 >60    LIVER FUNCTION TESTS: Recent Labs    10/28/22 0728 11/02/22 1053 11/03/22 0241 11/04/22 1130  BILITOT 1.2 0.8 0.6 0.2*  AST 16 12* 12* 14*  ALT 12 14 10 11   ALKPHOS 112 111 108 102  PROT 5.4* 5.8* 5.5* 5.6*  ALBUMIN 1.7* 1.7* 1.7* 1.8*    Assessment:  S/P Drain placements - Right done yesterday.  Left done 3/26 = now with minimal drainage and resolution of previous fluid collection.  Currently in review with IR MD to evaluate for possible removal of the left drain.  Plan: Continue flushes as odered with 5 cc NS. Record output Q shift. Dressing changes QD or PRN if soiled.  Call IR APP or on call IR MD if  difficulty flushing or sudden change in drain output.  Repeat imaging/possible  drain injection once output < 10 mL/QD (excluding flush material). Consideration for drain removal if output is < 10 mL/QD (excluding flush material), pending discussion with the providing surgical service.  Discharge planning: Please contact IR APP or on call IR MD prior to patient d/c to ensure appropriate follow up plans are in place. Typically patient will follow up with IR clinic 10-14 days post d/c for repeat imaging/possible drain injection. IR scheduler will contact patient with date/time of appointment. Patient will need to flush drain QD with 5 cc NS, record output QD, dressing changes every 2-3 days or earlier if soiled.   IR will continue to follow - please call with questions or concerns.  Electronically Signed: Gwynneth Macleod, PA-C 11/04/2022, 2:20 PM    I spent a total of 15 Minutes at the the patient's bedside AND on the patient's hospital floor or unit, greater than 50% of which was counseling/coordinating care for f/u drains

## 2022-11-04 NOTE — Progress Notes (Signed)
Nutrition Follow-up  DOCUMENTATION CODES:  Morbid obesity  INTERVENTION:  TPN management per pharmacy Would not recommend reducing infusion rate of TPN until pt can consistently tolerate a full liquid diet Pt does report that she is willing to try boost breeze and prosource plus when able to trial a PO diet Boost Breeze po TID, each supplement provides 250 kcal and 9 grams of protein Prosource Plus BID to provide 100 kcal and 15g of protein per packet  NUTRITION DIAGNOSIS:  Inadequate oral intake related to poor appetite, nausea as evidenced by meal completion < 25%, NPO status, per patient/family report. - remains applicable  GOAL:  Patient will meet greater than or equal to 90% of their needs - progressing, being met with TPN  MONITOR:  I & O's, Labs, Weight trends, Diet advancement  REASON FOR ASSESSMENT:  Malnutrition Screening Tool    ASSESSMENT:  Pt with hx of Bell's Palsy, DM type 2, CHF, HTN, HLD, and GERD presented to ED with AMS. Recently admitted for nausea  and abdominal pain. Imaging showed a 5 x 5 cm fluid collection worrisome for a developing abscess.  3/26 - CT guided drain placed in IR 4/3 - PICC placed, TPN initiated  Pt resting in bed at the time of visit, just had repeat XR taken. Results pending  Pt reports that she is still having some abdominal discomfort but that she does feel a little bit better today than she did at last assessment. States she has been passing gas and also feels like she needs to have a BM.   TPN is currently infusing at 62mL/h which is goal and is providing 112 g of protein and 1988 kcals per day and meeting 100% of pt's estimated nutrition needs. As pt has had such poor intake for ~1 month, would not recommend decreasing rate of TPN until she is consistently tolerating a full liquid diet. Clear liquid diet provides minimal nutrition and, without the use of nutrition supplements, virtually no protein.     Average Meal  Intake: 3/25-4/1: 0-5% intake x 3 recorded meals  Nutritionally Relevant Medications: Scheduled Meds:  insulin aspart  0-9 Units Subcutaneous Q4H   insulin glargine-yfgn  10 Units Subcutaneous Daily   pantoprazole (PROTONIX) IV  40 mg Intravenous Q24H   Continuous Infusions:  ampicillin-sulbactam (UNASYN) IV 3 g (11/04/22 0851)   TPN ADULT (ION) 90 mL/hr at 11/03/22 1721   PRN Meds: bisacodyl, ondansetron, senna-docusate  Labs Reviewed  NUTRITION - FOCUSED PHYSICAL EXAM: Flowsheet Row Most Recent Value  Orbital Region No depletion  Upper Arm Region No depletion  Thoracic and Lumbar Region No depletion  Buccal Region No depletion  Temple Region No depletion  Clavicle Bone Region No depletion  Clavicle and Acromion Bone Region No depletion  Scapular Bone Region No depletion  Dorsal Hand No depletion  Patellar Region No depletion  Anterior Thigh Region No depletion  Posterior Calf Region No depletion  Edema (RD Assessment) Mild  Hair Reviewed  Eyes Reviewed  Mouth Reviewed  Skin Reviewed  Nails Reviewed   Diet Order:   Diet Order             Diet NPO time specified Except for: Sips with Meds  Diet effective midnight                   EDUCATION NEEDS:  Education needs have been addressed  Skin:  Skin Assessment: Reviewed RN Assessment  Last BM:  3/28  Height:  Ht Readings from  Last 1 Encounters:  10/25/22 5\' 8"  (1.727 m)    Weight:  Wt Readings from Last 1 Encounters:  11/03/22 (!) 164.8 kg    Ideal Body Weight:  63.6 kg  BMI:  Body mass index is 55.24 kg/m.  Estimated Nutritional Needs:  Kcal:  1900-2100 kcal/d Protein:  100-120 g/d Fluid:  2L/d    Greig Castilla, RD, LDN Clinical Dietitian RD pager # available in [facility]  After hours/weekend pager # available in [facility]

## 2022-11-04 NOTE — Progress Notes (Signed)
Subjective: CC: She reports lower abdominal pain, worse on the left. Some upper abdominal bloating. Ongoing nausea. She is unsure if any worse than her baseline. No vomiting. Passing flatus. No bm but feels like she needs to.   Objective: Vital signs in last 24 hours: Temp:  [98.1 F (36.7 C)-98.3 F (36.8 C)] 98.3 F (36.8 C) (04/05 0404) Pulse Rate:  [82-96] 94 (04/05 0833) Resp:  [11-24] 13 (04/05 0833) BP: (107-197)/(54-89) 154/72 (04/05 0833) SpO2:  [96 %-100 %] 100 % (04/05 0833) Last BM Date : 11/01/22  Intake/Output from previous day: 04/04 0701 - 04/05 0700 In: 5  Out: 880 [Urine:850; Drains:30] Intake/Output this shift: No intake/output data recorded.  PE: Gen:  Alert, NAD, pleasant Abd: Obese, soft, some upper abdominal distension, generalized ttp that she states is worse on the left side of her abdomen. No rigidity or rebound. Some BS. L IR drain with mixed SS/purulent drainage in gravity bag. R IR drain with SS output in JP drain.    Lab Results:  Recent Labs    11/03/22 1700 11/04/22 0755  WBC 15.4* 12.8*  HGB 7.4* 7.2*  HCT 22.8* 22.2*  PLT 480* 437*    BMET Recent Labs    11/02/22 1053 11/03/22 0241  NA 138 134*  K 3.2* 3.4*  CL 105 107  CO2 23 23  GLUCOSE 130* 133*  BUN <5* 5*  CREATININE 0.73 0.64  CALCIUM 8.5* 7.9*    PT/INR No results for input(s): "LABPROT", "INR" in the last 72 hours. CMP     Component Value Date/Time   NA 134 (L) 11/03/2022 0241   NA 140 11/01/2018 1525   K 3.4 (L) 11/03/2022 0241   CL 107 11/03/2022 0241   CO2 23 11/03/2022 0241   GLUCOSE 133 (H) 11/03/2022 0241   BUN 5 (L) 11/03/2022 0241   BUN 8 11/01/2018 1525   CREATININE 0.64 11/03/2022 0241   CREATININE 0.99 12/22/2021 1052   CALCIUM 7.9 (L) 11/03/2022 0241   PROT 5.5 (L) 11/03/2022 0241   ALBUMIN 1.7 (L) 11/03/2022 0241   AST 12 (L) 11/03/2022 0241   AST 7 (L) 12/22/2021 1052   ALT 10 11/03/2022 0241   ALT 6 12/22/2021 1052   ALKPHOS  108 11/03/2022 0241   BILITOT 0.6 11/03/2022 0241   BILITOT 0.4 12/22/2021 1052   GFRNONAA >60 11/03/2022 0241   GFRNONAA >60 12/22/2021 1052   GFRAA >60 02/07/2020 0315   Lipase     Component Value Date/Time   LIPASE 32 10/21/2022 1648    Studies/Results: CT GUIDED PERITONEAL/RETROPERITONEAL FLUID DRAIN BY PERC CATH  Result Date: 11/03/2022 CLINICAL DATA:  Status post previous percutaneous catheter drainage of left-sided pelvic abscess on 10/25/2022. New intra-abdominal fluid collection in the right lower quadrant now requiring second percutaneous catheter drainage procedure. EXAM: CT GUIDED CATHETER DRAINAGE OF PERITONEAL ABSCESS ANESTHESIA/SEDATION: Moderate (conscious) sedation was employed during this procedure. A total of Versed 2.0 mg and Fentanyl 100 mcg was administered intravenously. Moderate Sedation Time: 26 minutes. The patient's level of consciousness and vital signs were monitored continuously by radiology nursing throughout the procedure under my direct supervision. PROCEDURE: The procedure, risks, benefits, and alternatives were explained to the patient. Questions regarding the procedure were encouraged and answered. The patient understands and consents to the procedure. A time out was performed prior to initiating the procedure. The right lower abdominal wall was prepped with chlorhexidine in a sterile fashion, and a sterile drape was applied covering  the operative field. A sterile gown and sterile gloves were used for the procedure. Local anesthesia was provided with 1% Lidocaine. CT was performed in a supine position through the mid to lower abdomen and pelvis. After choosing a site for skin entry an 18 gauge trocar needle was advanced under CT guidance to the level of a right lower quadrant anterior peritoneal abscess. After confirming needle tip position, fluid was aspirated. A guidewire was advanced into the fluid collection and the needle removed. The tract was dilated over the  wire and a 10 French percutaneous drainage catheter placed. Catheter position was confirmed by CT. The drainage catheter was further aspirated and a fluid sample sent for culture analysis. The drain was flushed and connected to a suction bulb. It was secured at the skin with a Prolene retention suture and adhesive retention device. RADIATION DOSE REDUCTION: This exam was performed according to the departmental dose-optimization program which includes automated exposure control, adjustment of the mA and/or kV according to patient size and/or use of iterative reconstruction technique. COMPLICATIONS: None FINDINGS: Fluid collection in the right lower quadrant again localizes lateral to bowel and measures roughly 4 cm in maximum diameter. The fluid collection in the left pelvis is completely decompressed by the previously placed percutaneous drainage catheter. After advancing a trocar needle into the collection, there was return of turbid, yellow fluid. After drainage catheter placement, a larger fluid sample was withdrawn and sent for culture analysis. After drain placement, there is good return fluid. IMPRESSION: CT-guided percutaneous catheter drainage of right lower quadrant peritoneal abscess. A 10 French drainage catheter was placed and attached to suction bulb drainage. A sample of turbid yellow fluid was sent for culture analysis. Initial CT shows complete decompression of the left pelvic fluid collection after prior catheter drainage. Electronically Signed   By: Irish Lack M.D.   On: 11/03/2022 14:30   DG Abd Portable 1V-Small Bowel Obstruction Protocol-initial, 8 hr delay  Result Date: 11/03/2022 CLINICAL DATA:  Small-bowel obstruction EXAM: PORTABLE ABDOMEN - 1 VIEW COMPARISON:  CT 11/01/2022 FINDINGS: Oral contrast material appears to abut Mrs. There is diffuse small bowel dilatation, measuring up to 5.5-6.5 cm. Normal caliber colon with gaseous distension. Percutaneous drainage catheter noted in the  left hemipelvis. IMPRESSION: Diffuse small bowel dilation compatible with small bowel obstruction. Colon is also distended to a degree, ileus is possible. Oral contrast noted in the stomach. Electronically Signed   By: Caprice Renshaw M.D.   On: 11/03/2022 12:10    Anti-infectives: Anti-infectives (From admission, onward)    Start     Dose/Rate Route Frequency Ordered Stop   10/30/22 2000  Ampicillin-Sulbactam (UNASYN) 3 g in sodium chloride 0.9 % 100 mL IVPB        3 g 200 mL/hr over 30 Minutes Intravenous Every 6 hours 10/30/22 1530     10/26/22 2200  meropenem (MERREM) 2 g in sodium chloride 0.9 % 100 mL IVPB  Status:  Discontinued        2 g 280 mL/hr over 30 Minutes Intravenous Every 8 hours 10/26/22 1409 10/30/22 1530   10/26/22 1000  fluconazole (DIFLUCAN) tablet 100 mg       See Hyperspace for full Linked Orders Report.   100 mg Oral Daily 10/25/22 1412 10/31/22 1011   10/25/22 2000  doxycycline (VIBRAMYCIN) 100 mg in sodium chloride 0.9 % 250 mL IVPB  Status:  Discontinued        100 mg 125 mL/hr over 120 Minutes Intravenous Every 12  hours 10/25/22 1304 10/30/22 1530   10/25/22 1500  fluconazole (DIFLUCAN) tablet 200 mg       See Hyperspace for full Linked Orders Report.   200 mg Oral Daily 10/25/22 1412 10/25/22 1718   10/25/22 1400  meropenem (MERREM) 2 g in sodium chloride 0.9 % 100 mL IVPB  Status:  Discontinued        2 g 280 mL/hr over 30 Minutes Intravenous Every 12 hours 10/25/22 1304 10/26/22 1409   10/25/22 1200  doxycycline (VIBRA-TABS) tablet 100 mg  Status:  Discontinued        100 mg Oral Every 12 hours 10/25/22 1103 10/25/22 1109   10/24/22 0045  cefTRIAXone (ROCEPHIN) 2 g in sodium chloride 0.9 % 100 mL IVPB  Status:  Discontinued        2 g 200 mL/hr over 30 Minutes Intravenous Daily at bedtime 10/24/22 0025 10/25/22 1102   10/24/22 0045  metroNIDAZOLE (FLAGYL) IVPB 500 mg  Status:  Discontinued        500 mg 100 mL/hr over 60 Minutes Intravenous 2 times daily  10/24/22 0025 10/25/22 1102   10/24/22 0030  doxycycline (VIBRA-TABS) tablet 100 mg  Status:  Discontinued        100 mg Oral Every 12 hours 10/24/22 0025 10/25/22 1102   10/24/22 0000  piperacillin-tazobactam (ZOSYN) IVPB 3.375 g  Status:  Discontinued        3.375 g 100 mL/hr over 30 Minutes Intravenous  Once 10/23/22 2348 10/24/22 0025        Assessment/Plan Left tubo-ovarian abscess s/p IR drain placement 3/26 New RLQ fluid collection concerning for abscess SBO - This is a 52yo female with multiple co-morbidities including Lupus on chronic immunosuppressant, who was [facility] with AMS and found to have a Left tubo-ovarian abscess which was drained by IR 3/26. Culture w/ strep anginosis, GBS, prevotella bivia. She had a repeat CT scan 4/2 that showed a new 3.2 x 7.4 x 5.0 cm fluid collection in the RLQ. Appendix is not visualized but it would be odd for her to have perforated appendicitis in addition to a left tubo-ovarian abscess. Unclear etiology of fluid collection but I do not believe she needs acute surgical intervention. S/p IR drain of second fluid collection on 4/4. Cx pending. ID is following and managing her antibiotics.  - CT scan also concerning for SBO. Patient did not tolerate NGT placement. SBO protocol being done orally. Yesterday with contrast still in stomach but also diffuse small bowel dilation and colonic distension/air in the colon. Question if more of psbo vs ileus 2/2 above intra-abdominal infection. Repeat delayed films pending this am. Would keep NPO currently. Already on TPN per primary. If worsening abdominal pain, n/v would recommend reattempt at NGT placement. Hopefully will improve with conservative management.  We will follow.   FEN - IVF, NPO. TNA per primary. Keep K > 4 and Mg > 2 for bowel function.  VTE - SCDs, hold eliquis. Okay for heparin gtt from our standpoint. Mobilize as able for bowel function. ID - unasyn, per ID. Afebrile. WBC  downtrending.  Foley - none   I reviewed nursing notes, ED provider notes, Consultant (IR) notes, hospitalist notes, last 24 h vitals and pain scores, last 48 h intake and output, last 24 h labs and trends, and last 24 h imaging results.   LOS: 11 days    Jacinto HalimMichael M Devon Kingdon , [facility] 11/04/2022, 9:06 AM Please see Amion for  pager number during day hours 7:00am-4:30pm

## 2022-11-04 NOTE — Progress Notes (Signed)
PROGRESS NOTE    [name]  CVK:[id number] DOB: 1970/09/01 DOA: 10/23/2022 PCP: Verlon Au, MD   Brief Narrative:  The patient is a 52 year old African-American female with past medical history of morbid obesity with BMI greater than 50, systolic/diastolic CHF with previous ejection fraction of 30 to 35% and grade 1 diastolic dysfunction, chronic right ventricular failure, history of DVT, hypothyroidism, depression and anxiety and chronic abdominal pain who presented to the emergency room on 3/24 with complaints of lightheadedness and abdominal pain.  She was also found to be somewhat confused.  Workup revealed severe sepsis secondary to intra-abdominal abscess concerning for PID/TOA and case discussed with gynecology.  Mass not present on CT on 3/15.  Interventional radiology consulted and patient underwent drain placement 3/26.  Mentation much improved.   Patient had been on a limited diet full liquids due to complaints of abdominal pain.  Started having some nausea and vomiting 3/29.  Abdominal x-ray noted dilated small bowel loops concerning for developing ileus.  Symptoms have persisted and repeat x-ray on 3/30 confirms SBO versus ileus.  Patient has had a prolonged period without eating much, and with persistent ileus, started on PICC line and TPN 4/2.    Repeat CT scan done and showed a newly identified intra-abdominal abscess and so interventional radiology was consulted and they feel that she is a candidate for a repeat drain placement and so she has been made n.p.o. at midnight and her blood thinners are being held.  Plan is for vein placement in the a.m. with Dr. Fredia Sorrow.  General surgery was also consulted given her ileus versus a small bowel obstruction so general surgery recommended NG tube placement for decompression of small bowel obstruction protocol with Gastrografin delayed film however patient was not able to tolerate NG to placement.  SBO protocol was done via oral  Gastrografin and delayed films still pending.  She underwent drain placement yesterday for her new found abscess.  **Continues to have some abdominal pain but WBC is trending down.  General surgery is going to be repeating her KUB given that she had contrast in the stomach still and also had some diffuse small bowel dilation and colonic distention and colon and recommending continuing n.p.o. for now.  Will continue to mobilize the patient.  Nutritionist consulted and recommending continue TPN and not elucidated relatively until patient clinically systolic tolerating full code diet.  ID recommending continuing Unasyn and transition to Augmentin when able to tolerate p.o.  They recommend 4 weeks of antibiotics from the last aspiration on 11/03/2022.  Assessment and Plan:   Severe sepsis secondary to intra-abdominal abscess in patient with acute on chronic abdominal pain. -Patient met criteria for severe sepsis given fever, leukocytosis,  -AKI creatinine greater than 2 and abdominal source.   -CT abdomen shows evidence of a 5 x 5 cm fluid collection in the left hemipelvis concerning for developing abscess.  GYN was consulted.  Discussed with Dr. Debroah Loop. Patient has not had sex in 4 years and the mass was not present on CT 3/15.   -GYN does not think that the patient is suffering from a tubo-ovarian abscess.   -Patient underwent IR guided drain placement on 3/26.  -Following drain placement and antibiotic adjustment, white blood cell count and procalcitonin initially improved and started to trend back upward.   -Now with minimal output.  Intervention radiology following.   -Repeat CT Scan done and showed "Interval percutaneous drainage of the loculated inflammatory appearing fluid collection within the  left adnexa with complete evacuation of the previously noted loculated fluid. Extensive surrounding inflammatory change and poorly loculated fluid within the right adnexa persist. New loculated rim enhancing  fluid collection within the right lower quadrant abutting several loops of a distal small bowel measuring 3.2 x 7.4 x 5.0 cm. Developing mid small bowel obstruction with a single point of transition identified within the mid abdomen. Minimal right nonobstructing nephrolithiasis. Moderate subcutaneous body wall edema." -WBC Trend:  Recent Labs  Lab 10/30/22 1127 10/31/22 0426 11/01/22 0532 11/01/22 1146 11/02/22 1053 11/03/22 1700 11/04/22 0755  WBC 23.3* 22.7* 22.2* 22.3* 18.3* 15.4* 12.8*  -Infectious Disease has been consulted and following recommending IV Unasyn and transitioning to p.o. Augmentin at the time of discharge on treating for 4 weeks since last drain placement -Given new abscess and a developing mid small bowel obstruction have consulted general surgery as well as interventional radiology and the plan is for another drain placement in her new abscess; drain was placed via CT-guided drainage of the right lower quadrant peritoneal fluid collection by Dr. Fredia Sorrow and she now has a 10 [place] drain the fluid sent for culture analysis currently showing no growth to date.    Syncope. Acute metabolic encephalopathy/hypersomnolence-resolved -Initially presented with what appears to be a syncopal event. There was a concern for seizures, however EEG was unremarkable.   -Ammonia level, TSH, HIV, RPR all unremarkable.   -VBG negative for hypercarbia.   -Neurology evaluated and felt findings unrelated to any neurological issue.  Initial concern this could be secondary to pain medication.   -Patient's IV pain medicine was held and she became more appropriate and alert.   -Felt to be multifactorial secondary to decreased clearance from acute kidney injury, additional anesthesia and pain medication from intervention radiology for drain placement and IV pain medicine.   -By 3/28, patient much improved today despite getting continued pain medication is alert and oriented x 3 -Will need  orthostatics prior to discharge and PT OT evaluation recommending SNF   ? UTI -Patient's white blood cell count initially improved following antibiotics and drain placement, down to 13 on 3/29.  -However, since then has been steadily rising, currently at 23.  Urine not very impressive.   -Negative cultures.  Discussed with infectious disease and they feel white count more likely related to SBO/ileus.   -See Above   Persistent ileus vs pSBO -Persisting.   -Treating underlying infection and electrolyte abnormalities.  Ice chips.   -Will have PT see to increase ambulation.   -Have added TPN due to prolonged period without nutrition.  -Rechecking abdominal CT for follow-up on abscess and as above -Now she has a developing mid small bowel obstruction with a single point of transition identified within the mid abdomen. -General surgery has been consulted and agree with IR to percutaneously drain the second fluid collection and recommending NG tube placement for decompression and small bowel protocol with Gastrografin delayed film however patient refused NG tube placement currently; she did not tolerate NG tube placement but small bowel protocol done orally and there were no follow-up on her delayed films.  If she has worsening abdominal pain nausea vomiting they are recommending reattempting NG tube placement and feel that this can hopefully improve with conservative management -X-Ray done this Am and showed "Persistent dilated loops of small bowel which may reflect an ileus or partial small bowel obstruction. New percutaneous drain in the right lower quadrant. No evidence of extravasated contrast material." -General surgery recommending optimizing electrolytes  mobilization and keeping n.p.o.   AKI, resolved. -Suspect ATN in the setting of sepsis, possibility of contrast-induced injury as well as prerenal etiology in the setting of poor p.o. intake.   -BUN/Cr Trend: Recent Labs  Lab 10/29/22 1037  10/30/22 1127 10/31/22 0426 11/01/22 0532 11/02/22 1053 11/03/22 0241 11/04/22 1130  BUN 12 9 10 7  <5* 5* 8  CREATININE 0.83 0.76 0.76 0.80 0.73 0.64 0.67  -Improved with IVF and now getting LR at 35 mL/hr as patient is on TPN as she is on TPN at 90 mL/hr -Continue to Monitor for S/Sx of Volume Overload  -Avoid Nephrotoxic Medications, Contrast Dyes, Hypotension and Dehydration to Ensure Adequate Renal Perfusion and will need to Renally Adjust Meds -Continue to Monitor and Trend Renal Function carefully and repeat CMP in the AM  -Monitor closely due to history of CHF.  Much improved by 3/28   Hypersomnolence. OHS. Obesity class III. -Complicates overall prognosis and care and patient at high risk for poor outome -Estimated body mass index is 55.24 kg/m as calculated from the following:   Height as of this encounter: 5\' 8"  (1.727 m).   Weight as of this encounter: 164.8 kg.  -Weight Loss and Dietary Counseling given -Patient was recommended to have an outpatient sleep evaluation during her recent hospitalization.   Chronic Combined Systolic and Diastolic CHF -Echocardiogram done during this hospitalization notes much improvement in ejection fraction and indeterminate diastolic function.   -BNP within normal limit.  Intake/Output Summary (Last 24 hours) at 11/04/2022 1938 Last data filed at 11/04/2022 1650 Gross per 24 hour  Intake --  Output 640 ml  Net -640 ml  -Continue to Monitor for S/Sx of Volume Overload -Currently appears euvolemic   Gastroparesis. -Patient is not on any medication for gastroparesis.   -Was Tolerating p.o. but still reporting some nausea and vomiting.  -Intolerant of Reglan.   -Abdominal x-ray notes multiple dilated air-filled loops and repeat CT scan as above.   -Repeat KUB done and showed "Persistent dilated loops of small bowel which may reflect an ileus or partial small bowel obstruction. New percutaneous drain in the right lower quadrant. No evidence  of extravasated contrast material." -Will replace electrolytes accordingly. -Currently on Zofran. -Will resume home scopolamine patch.   Hypokalemia -Patient's K+ Level Trend: Recent Labs  Lab 10/29/22 1037 10/30/22 1127 10/31/22 0426 11/01/22 0532 11/02/22 1053 11/03/22 0241 11/04/22 1130  K 3.7 5.1 3.5 3.6 3.2* 3.4* 3.6  -Replete with TPN -Continue to Monitor and Replete as Necessary -Repeat CMP in the AM    Hypomagnesemia -Patient's Mag Level Trend: Recent Labs  Lab 10/24/22 0643 11/01/22 0532 11/02/22 1053 11/03/22 0241 11/04/22 1130  MG 1.7 1.7 1.6* 2.0 1.7  -Continue to Monitor and Replete as Necessary with TPN -Repeat Mag in the AM    Severe Protein Calorie Malnutrition -Nutrition Status: Nutrition Problem: Inadequate oral intake Etiology: poor appetite, nausea Signs/Symptoms: meal completion < 25%, NPO status, per patient/family report Interventions: Refer to RD note for recommendations  History of PE. -Had a PE last October, told that she needs to be on Eliquis indefinitely.  Currently on Eliquis but will change to Heparin gtt given NPO   Type 2 diabetes mellitus, uncontrolled with hyperglycemia with gastroparesis. -With long-term insulin use. -Patient is actually on U-500 insulin at home. -Now with SBO/ileus, decreased long-acting insulin down to 10 units only plus Q for her sliding scale. -Continue monitor CBGs per protocol -CBG Trend: Recent Labs  Lab 11/03/22 1643 11/03/22  2015 11/04/22 0014 11/04/22 0412 11/04/22 0756 11/04/22 1155 11/04/22 1620  GLUCAP 122* 142* 136* 153* 173* 146* 167*    Lupus and Sjogren's Syndrome. -Patient has on hydroxychloroquine, belimumab as well as Imuran which have all been held -Patient remains at risk for infections. -Continue Monitor for now.   Anxiety and depression. -Continue home regimen and C/w Quetiapine 200 mg po daily if able due to NPO status   Hypothyroidism. -Continue Levothyroxine 50 mcg po  Daily but may need to change to IV given NPO status   Overactive bladder. -On Ditropan, currently held due to NPO   GERD. -Continue PPI with Pantoprazole 40 mg po Daily but will change to IV now given NPO status    Diabetic neuropathy. -Has been on Lyrica. -Due to encephalopathy it was held.  Restart slowly   Hyperlipidemia. -Holding Crestor while NPO with meds and sips and on TPN with ? SBO   Oral candidiasis. -Completed a week of fluconazole.   Hypoalbuminemia -Patient's Albumin Trend: Recent Labs  Lab 10/23/22 2110 10/26/22 0406 10/27/22 0724 10/28/22 0728 11/02/22 1053 11/03/22 0241 11/04/22 1130  ALBUMIN 2.9* 1.9* 2.0* 1.7* 1.7* 1.7* 1.8*  -Continue to Monitor and Trend and repeat CMP in the AM   Obesity -Complicates overall prognosis and care -Estimated body mass index is 55.24 kg/m as calculated from the following:   Height as of this encounter: 5\' 8"  (1.727 m).   Weight as of this encounter: 164.8 kg.  -Weight Loss and Dietary Counseling given  DVT prophylaxis: apixaban (ELIQUIS) tablet 2.5 mg Start: 10/25/22 2200 Place and maintain sequential compression device Start: 10/25/22 1103 apixaban (ELIQUIS) tablet 2.5 mg    Code Status: Full Code Family Communication: No Family currently at bedside  Disposition Plan:  Level of care: Telemetry Medical Status is: Inpatient Remains inpatient appropriate because: Further clinical improvement and clearance by specialist; surgery still following and ID has now signed off the case.   Consultants:  Infectious Diseases General Surgery Interventional Radiology   Procedures:  As delineated as above  Antimicrobials:  Anti-infectives (From admission, onward)    Start     Dose/Rate Route Frequency Ordered Stop   10/30/22 2000  Ampicillin-Sulbactam (UNASYN) 3 g in sodium chloride 0.9 % 100 mL IVPB        3 g 200 mL/hr over 30 Minutes Intravenous Every 6 hours 10/30/22 1530     10/26/22 2200  meropenem (MERREM) 2 g  in sodium chloride 0.9 % 100 mL IVPB  Status:  Discontinued        2 g 280 mL/hr over 30 Minutes Intravenous Every 8 hours 10/26/22 1409 10/30/22 1530   10/26/22 1000  fluconazole (DIFLUCAN) tablet 100 mg       See Hyperspace for full Linked Orders Report.   100 mg Oral Daily 10/25/22 1412 10/31/22 1011   10/25/22 2000  doxycycline (VIBRAMYCIN) 100 mg in sodium chloride 0.9 % 250 mL IVPB  Status:  Discontinued        100 mg 125 mL/hr over 120 Minutes Intravenous Every 12 hours 10/25/22 1304 10/30/22 1530   10/25/22 1500  fluconazole (DIFLUCAN) tablet 200 mg       See Hyperspace for full Linked Orders Report.   200 mg Oral Daily 10/25/22 1412 10/25/22 1718   10/25/22 1400  meropenem (MERREM) 2 g in sodium chloride 0.9 % 100 mL IVPB  Status:  Discontinued        2 g 280 mL/hr over 30 Minutes Intravenous Every 12  hours 10/25/22 1304 10/26/22 1409   10/25/22 1200  doxycycline (VIBRA-TABS) tablet 100 mg  Status:  Discontinued        100 mg Oral Every 12 hours 10/25/22 1103 10/25/22 1109   10/24/22 0045  cefTRIAXone (ROCEPHIN) 2 g in sodium chloride 0.9 % 100 mL IVPB  Status:  Discontinued        2 g 200 mL/hr over 30 Minutes Intravenous Daily at bedtime 10/24/22 0025 10/25/22 1102   10/24/22 0045  metroNIDAZOLE (FLAGYL) IVPB 500 mg  Status:  Discontinued        500 mg 100 mL/hr over 60 Minutes Intravenous 2 times daily 10/24/22 0025 10/25/22 1102   10/24/22 0030  doxycycline (VIBRA-TABS) tablet 100 mg  Status:  Discontinued        100 mg Oral Every 12 hours 10/24/22 0025 10/25/22 1102   10/24/22 0000  piperacillin-tazobactam (ZOSYN) IVPB 3.375 g  Status:  Discontinued        3.375 g 100 mL/hr over 30 Minutes Intravenous  Once 10/23/22 2348 10/24/22 0025       Subjective: Seen and examined at bedside and she is still complains of abdominal discomfort and nausea.  Thinks she may be doing a little bit better.  Denies any chest pain.  Feels like she is supposed to have a bowel movement.   Passing flatus.  No other concerns or close this time  Objective: Vitals:   11/04/22 0404 11/04/22 0833 11/04/22 1134 11/04/22 1500  BP:  (!) 154/72 (!) 148/75   Pulse: 86 94 86   Resp: 11 13 18    Temp: 98.3 F (36.8 C)  98.2 F (36.8 C) 98.4 F (36.9 C)  TempSrc: Oral  Oral Oral  SpO2: 100% 100% 100%   Weight:      Height:        Intake/Output Summary (Last 24 hours) at 11/04/2022 1923 Last data filed at 11/04/2022 1650 Gross per 24 hour  Intake 5 ml  Output 890 ml  Net -885 ml   Filed Weights   11/01/22 0600 11/02/22 0500 11/03/22 0500  Weight: (!) 165.4 kg (!) 164 kg (!) 164.8 kg   Examination: Physical Exam:  Constitutional: WN/WD super morbid obese African-American female appears uncomfortable Respiratory: Diminished to auscultation bilaterally with some coarse breath sounds, no wheezing, rales, rhonchi or crackles. Normal respiratory effort and patient is not tachypenic. No accessory muscle use.  Unlabored breathing Cardiovascular: RRR, no murmurs / rubs / gallops. S1 and S2 auscultated.  No appreciable extremity edema Abdomen: Soft, tender to palpate., distended secondary to body habitus.  Bowel sounds diminished.  Has 2 abdominal drains in place GU: Deferred. Musculoskeletal: No clubbing / cyanosis of digits/nails. No joint deformity upper and lower extremities.  Skin: No rashes, lesions, ulcers limited skin evaluation. No induration; Warm and dry.  Neurologic: CN 2-12 grossly intact with no focal deficits. Romberg sign and cerebellar reflexes not assessed.  Psychiatric: Normal judgment and insight. Alert and oriented x 3. Normal mood and appropriate affect.   Data Reviewed: I have personally reviewed following labs and imaging studies  CBC: Recent Labs  Lab 11/01/22 0532 11/01/22 1146 11/02/22 1053 11/03/22 1700 11/04/22 0755  WBC 22.2* 22.3* 18.3* 15.4* 12.8*  NEUTROABS  --  15.7*  --  10.5* 8.5*  HGB 7.9* 8.0* 7.9* 7.4* 7.2*  HCT 22.9* 23.3* 22.9* 22.8*  22.2*  MCV 84.5 85.0 84.5 89.1 94.9  PLT 442* 456* 473* 480* 437*   Basic Metabolic Panel: Recent Labs  Lab 10/31/22 0426 11/01/22 0532 11/02/22 1053 11/03/22 0241 11/04/22 1130  NA 132* 137 138 134* 140  K 3.5 3.6 3.2* 3.4* 3.6  CL 106 106 105 107 105  CO2 20* GLUCOSE 162* 130* 130* 133* 176*  BUN 10 7 <5* 5* 8  CREATININE 0.76 0.80 0.73 0.64 0.67  CALCIUM 8.0* 8.4* 8.5* 7.9* 8.3*  MG  --  1.7 1.6* 2.0 1.7  PHOS  --   --  2.5 3.0 3.3   GFR: Estimated Creatinine Clearance: 135.4 mL/min (by C-G formula based on SCr of 0.67 mg/dL). Liver Function Tests: Recent Labs  Lab 11/02/22 1053 11/03/22 0241 11/04/22 1130  AST 12* 12* 14*  ALT ALKPHOS 111 108 102  BILITOT 0.8 0.6 0.2*  PROT 5.8* 5.5* 5.6*  ALBUMIN 1.7* 1.7* 1.8*   No results for input(s): "LIPASE", "AMYLASE" in the last 168 hours. No results for input(s): "AMMONIA" in the last 168 hours. Coagulation Profile: No results for input(s): "INR", "PROTIME" in the last 168 hours. Cardiac Enzymes: No results for input(s): "CKTOTAL", "CKMB", "CKMBINDEX", "TROPONINI" in the last 168 hours. BNP (last 3 results) No results for input(s): "PROBNP" in the last 8760 hours. HbA1C: No results for input(s): "HGBA1C" in the last 72 hours. CBG: Recent Labs  Lab 11/04/22 0014 11/04/22 0412 11/04/22 0756 11/04/22 1155 11/04/22 1620  GLUCAP 136* 153* 173* 146* 167*   Lipid Profile: Recent Labs    11/02/22 1051 11/03/22 0241  TRIG 85 87   Thyroid Function Tests: No results for input(s): "TSH", "T4TOTAL", "FREET4", "T3FREE", "THYROIDAB" in the last 72 hours. Anemia Panel: No results for input(s): "VITAMINB12", "FOLATE", "FERRITIN", "TIBC", "IRON", "RETICCTPCT" in the last 72 hours. Sepsis Labs: Recent Labs  Lab 10/30/22 1450 10/31/22 0426 11/01/22 0532 11/02/22 0500  PROCALCITON 0.68 0.45 0.32 0.27    Recent Results (from the past 240 hour(s))  Urine Culture     Status: Abnormal    Collection Time: 10/30/22  3:00 AM   Specimen: Urine, Random  Result Value Ref Range Status   Specimen Description URINE, RANDOM  Final   Special Requests NONE Reflexed from [id number]  Final   Culture (A)  Final    <10,000 COLONIES/mL INSIGNIFICANT GROWTH Performed at [facility], [address], [place], [place] [address]    Report Status 10/31/2022 FINAL  Final  Aerobic/Anaerobic Culture w Gram Stain (surgical/deep wound)     Status: None (Preliminary result)   Collection Time: 11/03/22  1:08 PM   Specimen: Abscess  Result Value Ref Range Status   Specimen Description ABSCESS  Final   Special Requests Normal  Final   Gram Stain   Final    ABUNDANT WBC PRESENT, PREDOMINANTLY PMN NO ORGANISMS SEEN    Culture   Final    NO GROWTH < 24 HOURS Performed at [facility], [address], [place], [place] [address]    Report Status PENDING  Incomplete     Radiology Studies: DG Abd Portable 1V  Result Date: 11/04/2022 CLINICAL DATA:  Small-bowel obstruction. EXAM: PORTABLE ABDOMEN - 1 VIEW COMPARISON:  Radiographs 11/03/2022 and 11/01/2022.  CT 11/01/2022. FINDINGS: 0938 hours. Four supine views of the abdomen are submitted. There is a new percutaneous drain in the right lower quadrant. Existing percutaneous drain in the left lower quadrant is not significantly changed. Multiple moderately dilated loops of small bowel are again noted. There is some contrast material in the stomach and in  the right lower quadrant, the latter probably within the colon. No extravasated contrast material identified. There is some gas in the colon. IMPRESSION: Persistent dilated loops of small bowel which may reflect an ileus or partial small bowel obstruction. New percutaneous drain in the right lower quadrant. No evidence of extravasated contrast material. Electronically Signed   By: Carey Bullocks M.D.   On: 11/04/2022 10:26   CT GUIDED PERITONEAL/RETROPERITONEAL FLUID DRAIN BY PERC CATH  Result  Date: 11/03/2022 CLINICAL DATA:  Status post previous percutaneous catheter drainage of left-sided pelvic abscess on 10/25/2022. New intra-abdominal fluid collection in the right lower quadrant now requiring second percutaneous catheter drainage procedure. EXAM: CT GUIDED CATHETER DRAINAGE OF PERITONEAL ABSCESS ANESTHESIA/SEDATION: Moderate (conscious) sedation was employed during this procedure. A total of Versed 2.0 mg and Fentanyl 100 mcg was administered intravenously. Moderate Sedation Time: 26 minutes. The patient's level of consciousness and vital signs were monitored continuously by radiology nursing throughout the procedure under my direct supervision. PROCEDURE: The procedure, risks, benefits, and alternatives were explained to the patient. Questions regarding the procedure were encouraged and answered. The patient understands and consents to the procedure. A time out was performed prior to initiating the procedure. The right lower abdominal wall was prepped with chlorhexidine in a sterile fashion, and a sterile drape was applied covering the operative field. A sterile gown and sterile gloves were used for the procedure. Local anesthesia was provided with 1% Lidocaine. CT was performed in a supine position through the mid to lower abdomen and pelvis. After choosing a site for skin entry an 18 gauge trocar needle was advanced under CT guidance to the level of a right lower quadrant anterior peritoneal abscess. After confirming needle tip position, fluid was aspirated. A guidewire was advanced into the fluid collection and the needle removed. The tract was dilated over the wire and a 10 French percutaneous drainage catheter placed. Catheter position was confirmed by CT. The drainage catheter was further aspirated and a fluid sample sent for culture analysis. The drain was flushed and connected to a suction bulb. It was secured at the skin with a Prolene retention suture and adhesive retention device.  RADIATION DOSE REDUCTION: This exam was performed according to the departmental dose-optimization program which includes automated exposure control, adjustment of the mA and/or kV according to patient size and/or use of iterative reconstruction technique. COMPLICATIONS: None FINDINGS: Fluid collection in the right lower quadrant again localizes lateral to bowel and measures roughly 4 cm in maximum diameter. The fluid collection in the left pelvis is completely decompressed by the previously placed percutaneous drainage catheter. After advancing a trocar needle into the collection, there was return of turbid, yellow fluid. After drainage catheter placement, a larger fluid sample was withdrawn and sent for culture analysis. After drain placement, there is good return fluid. IMPRESSION: CT-guided percutaneous catheter drainage of right lower quadrant peritoneal abscess. A 10 French drainage catheter was placed and attached to suction bulb drainage. A sample of turbid yellow fluid was sent for culture analysis. Initial CT shows complete decompression of the left pelvic fluid collection after prior catheter drainage. Electronically Signed   By: Irish Lack M.D.   On: 11/03/2022 14:30   DG Abd Portable 1V-Small Bowel Obstruction Protocol-initial, 8 hr delay  Result Date: 11/03/2022 CLINICAL DATA:  Small-bowel obstruction EXAM: PORTABLE ABDOMEN - 1 VIEW COMPARISON:  CT 11/01/2022 FINDINGS: Oral contrast material appears to abut Mrs. There is diffuse small bowel dilatation, measuring up to 5.5-6.5 cm. Normal caliber  colon with gaseous distension. Percutaneous drainage catheter noted in the left hemipelvis. IMPRESSION: Diffuse small bowel dilation compatible with small bowel obstruction. Colon is also distended to a degree, ileus is possible. Oral contrast noted in the stomach. Electronically Signed   By: Caprice RenshawJacob  Kahn M.D.   On: 11/03/2022 12:10    Scheduled Meds:  apixaban  2.5 mg Oral BID   carvedilol  3.125 mg  Oral BID WC   Chlorhexidine Gluconate Cloth  6 each Topical Daily   insulin aspart  0-9 Units Subcutaneous Q4H   insulin glargine-yfgn  10 Units Subcutaneous Daily   levothyroxine  50 mcg Oral Q0600   pantoprazole (PROTONIX) IV  40 mg Intravenous Q24H   QUEtiapine  200 mg Oral QHS   sodium chloride flush  3 mL Intravenous Q12H   sodium chloride flush  5 mL Intracatheter Q8H   Continuous Infusions:  ampicillin-sulbactam (UNASYN) IV 3 g (11/04/22 1228)   potassium chloride 10 mEq (11/04/22 1831)   TPN ADULT (ION) 90 mL/hr at 11/04/22 1718    LOS: 11 days   Marguerita Merlesmair Karrisa Didio, DO [facility] Available via Epic secure chat 7am-7pm After these hours, please refer to coverage provider listed on [email or web address] 11/04/2022, 7:23 PM

## 2022-11-05 ENCOUNTER — Inpatient Hospital Stay (HOSPITAL_COMMUNITY): Payer: Medicaid Other

## 2022-11-05 DIAGNOSIS — I5022 Chronic systolic (congestive) heart failure: Secondary | ICD-10-CM | POA: Diagnosis not present

## 2022-11-05 DIAGNOSIS — Z8673 Personal history of transient ischemic attack (TIA), and cerebral infarction without residual deficits: Secondary | ICD-10-CM | POA: Diagnosis not present

## 2022-11-05 DIAGNOSIS — G9340 Encephalopathy, unspecified: Secondary | ICD-10-CM | POA: Diagnosis not present

## 2022-11-05 DIAGNOSIS — E1159 Type 2 diabetes mellitus with other circulatory complications: Secondary | ICD-10-CM | POA: Diagnosis not present

## 2022-11-05 LAB — COMPREHENSIVE METABOLIC PANEL
ALT: 12 U/L (ref 0–44)
AST: 18 U/L (ref 15–41)
Albumin: 1.9 g/dL — ABNORMAL LOW (ref 3.5–5.0)
Alkaline Phosphatase: 98 U/L (ref 38–126)
Anion gap: 9 (ref 5–15)
BUN: 10 mg/dL (ref 6–20)
CO2: 24 mmol/L (ref 22–32)
Calcium: 8.5 mg/dL — ABNORMAL LOW (ref 8.9–10.3)
Chloride: 104 mmol/L (ref 98–111)
Creatinine, Ser: 0.69 mg/dL (ref 0.44–1.00)
GFR, Estimated: >60 mL/min (ref >60)
Glucose, Bld: 177 mg/dL — ABNORMAL HIGH (ref 70–99)
Potassium: 4.3 mmol/L (ref 3.5–5.1)
Sodium: 137 mmol/L (ref 135–145)
Total Bilirubin: 0.3 mg/dL (ref 0.3–1.2)
Total Protein: 5.8 g/dL — ABNORMAL LOW (ref 6.5–8.1)

## 2022-11-05 LAB — CBC WITH DIFFERENTIAL/PLATELET
Abs Immature Granulocytes: 0.18 10*3/uL — ABNORMAL HIGH (ref 0.00–0.07)
Basophils Absolute: 0.1 10*3/uL (ref 0.0–0.1)
Basophils Relative: 0 %
Eosinophils Absolute: 0.7 10*3/uL — ABNORMAL HIGH (ref 0.0–0.5)
Eosinophils Relative: 5 %
HCT: 23.3 % — ABNORMAL LOW (ref 36.0–46.0)
Hemoglobin: 7.4 g/dL — ABNORMAL LOW (ref 12.0–15.0)
Immature Granulocytes: 1 %
Lymphocytes Relative: 21 %
Lymphs Abs: 3.1 10*3/uL (ref 0.7–4.0)
MCH: 27.8 pg (ref 26.0–34.0)
MCHC: 31.8 g/dL (ref 30.0–36.0)
MCV: 87.6 fL (ref 80.0–100.0)
Monocytes Absolute: 1.5 10*3/uL — ABNORMAL HIGH (ref 0.1–1.0)
Monocytes Relative: 10 %
Neutro Abs: 9 10*3/uL — ABNORMAL HIGH (ref 1.7–7.7)
Neutrophils Relative %: 63 %
Platelets: 457 10*3/uL — ABNORMAL HIGH (ref 150–400)
RBC: 2.66 MIL/uL — ABNORMAL LOW (ref 3.87–5.11)
RDW: 14.6 % (ref 11.5–15.5)
WBC: 14.5 10*3/uL — ABNORMAL HIGH (ref 4.0–10.5)
nRBC: 0.1 % (ref 0.0–0.2)

## 2022-11-05 LAB — GLUCOSE, CAPILLARY
Glucose-Capillary: 178 mg/dL — ABNORMAL HIGH (ref 70–99)
Glucose-Capillary: 167 mg/dL — ABNORMAL HIGH (ref 70–99)
Glucose-Capillary: 150 mg/dL — ABNORMAL HIGH (ref 70–99)
Glucose-Capillary: 165 mg/dL — ABNORMAL HIGH (ref 70–99)
Glucose-Capillary: 178 mg/dL — ABNORMAL HIGH (ref 70–99)
Glucose-Capillary: 151 mg/dL — ABNORMAL HIGH (ref 70–99)
Glucose-Capillary: 133 mg/dL — ABNORMAL HIGH (ref 70–99)

## 2022-11-05 LAB — PHOSPHORUS: Phosphorus: 3.7 mg/dL (ref 2.5–4.6)

## 2022-11-05 LAB — MAGNESIUM: Magnesium: 1.8 mg/dL (ref 1.7–2.4)

## 2022-11-05 MED ORDER — TRAVASOL 10 % IV SOLN
INTRAVENOUS | Status: AC
  Filled 2022-11-05: qty 1123.2

## 2022-11-05 NOTE — Progress Notes (Signed)
[facility]  PROGRESS NOTE  TYNNETTA PILECKI QGB:[id number] DOB: 18-May-1971 DOA: 10/23/2022 PCP: Verlon Au, MD   Brief HPI:   52 year old African-American female with past medical history of morbid obesity with BMI greater than 50, systolic/diastolic CHF with previous ejection fraction of 30 to 35% and grade 1 diastolic dysfunction, chronic right ventricular failure, history of DVT, hypothyroidism, depression and anxiety and chronic abdominal pain who presented to the emergency room on 3/24 with complaints of lightheadedness and abdominal pain. She was also found to be somewhat confused. Workup revealed severe sepsis secondary to intra-abdominal abscess concerning for PID/TOA and case discussed with gynecology. Mass not present on CT on 3/15. Interventional radiology consulted and patient underwent drain placement 3/26.     Subjective   Patient seen and examined, pain well-controlled.  Currently on TPN.   Assessment/Plan:     Severe sepsis secondary to intra-abdominal abscess in patient with acute on chronic abdominal pain --CT abdomen shows evidence of a 5 x 5 cm fluid collection in the left hemipelvis concerning for developing abscess.  GYN was consulted.  Dr Marland Mcalpine discussed with Dr. Debroah Loop. Patient has not had sex in 4 years and the mass was not present on CT 3/15.   -IR was consulted for drain placement on 3/26 -ID was consulted, patient started on IV Unasyn with plan to transition to Augmentin at time of discharge for total of 4 weeks treatment since last drain placement -culture grew strep anginosus, GBS, Prevotella Bivia -Repeat CT scan on 4/2 showed new 3.2 x 7.4 x 5 cm fluid collection in the right lower quadrant -Underwent another drain placement on 4/4 per IR  Ileus versus small bowel obstruction -CT scan concerning for SBO, general surgery consulted -Plan to repeat CT scan abdomen next week  Syncope/metabolic encephalopathy -Resolved -EEG was  unremarkable -Ammonia level, TSH, HIV, RPR all unremarkable.   -VBG negative for hypercarbia. -Neurology felt it was medication induced, patient's IV pain medication was held and patient's mental status improved  Questionable UTI -WBC initially improved with antibiotics and drain placement -WBC down to 13,000 on 3/29  Chronic combined systolic and diastolic heart failure -Currently euvolemic  Gastroparesis -Continue Zofran as needed  History of pulm embolism -Had PE last October; needs to be on Eliquis indefinitely -Eliquis is currently on hold, patient started on Lovenox full dose  Diabetes mellitus type 2 -Patient on U-500 insulin at home -Presented with SBO/ileus -Continue Semglee 10 units subcu daily -Continue NovoLog sliding scale insulin -CBG well-controlled  Normocytic anemia -Hemoglobin is 7.4 -Follow CBC in a.m. -Transfuse for Hb less than 7  Hypothyroidism -Continue Synthroid  Severe protein calorie malnutrition -Currently on TPN   Medications     carvedilol  3.125 mg Oral BID WC   Chlorhexidine Gluconate Cloth  6 each Topical Daily   enoxaparin (LOVENOX) injection  120 mg Subcutaneous Q12H   insulin aspart  0-9 Units Subcutaneous Q4H   insulin glargine-yfgn  10 Units Subcutaneous Daily   [START ON 11/11/2022] levothyroxine  40 mcg Intravenous Daily   pantoprazole (PROTONIX) IV  40 mg Intravenous Q24H   QUEtiapine  200 mg Oral QHS   sodium chloride flush  3 mL Intravenous Q12H   sodium chloride flush  5 mL Intracatheter Q8H     Data Reviewed:   CBG:  Recent Labs  Lab 11/04/22 2203 11/05/22 0035 11/05/22 0357 11/05/22 0821 11/05/22 1241  GLUCAP 155* 165* 178* 167* 178*    SpO2: 100 % O2 Flow Rate (L/min):  2 L/min    Vitals:   11/05/22 0355 11/05/22 0730 11/05/22 0941 11/05/22 1130  BP:  136/68 139/64 (!) 149/65  Pulse: 81     Resp:  18  18  Temp: 98.4 F (36.9 C) 98.6 F (37 C)  98.6 F (37 C)  TempSrc: Oral Oral  Oral  SpO2:  99% 100%  100%  Weight:      Height:          Data Reviewed:  Basic Metabolic Panel: Recent Labs  Lab 11/01/22 0532 11/02/22 1053 11/03/22 0241 11/04/22 1130 11/05/22 0405  NA 137 138 134* 140 137  K 3.6 3.2* 3.4* 3.6 4.3  CL 106 105 107 105 104  CO2 23 23 23 24 24   GLUCOSE 130* 130* 133* 176* 177*  BUN 7 <5* 5* 8 10  CREATININE 0.80 0.73 0.64 0.67 0.69  CALCIUM 8.4* 8.5* 7.9* 8.3* 8.5*  MG 1.7 1.6* 2.0 1.7 1.8  PHOS  --  2.5 3.0 3.3 3.7    CBC: Recent Labs  Lab 11/01/22 1146 11/02/22 1053 11/03/22 1700 11/04/22 0755 11/05/22 0405  WBC 22.3* 18.3* 15.4* 12.8* 14.5*  NEUTROABS 15.7*  --  10.5* 8.5* 9.0*  HGB 8.0* 7.9* 7.4* 7.2* 7.4*  HCT 23.3* 22.9* 22.8* 22.2* 23.3*  MCV 85.0 84.5 89.1 94.9 87.6  PLT 456* 473* 480* 437* 457*    LFT Recent Labs  Lab 11/02/22 1053 11/03/22 0241 11/04/22 1130 11/05/22 0405  AST 12* 12* 14* 18  ALT 14 10 11 12   ALKPHOS 111 108 102 98  BILITOT 0.8 0.6 0.2* 0.3  PROT 5.8* 5.5* 5.6* 5.8*  ALBUMIN 1.7* 1.7* 1.8* 1.9*     Antibiotics: Anti-infectives (From admission, onward)    Start     Dose/Rate Route Frequency Ordered Stop   10/30/22 2000  Ampicillin-Sulbactam (UNASYN) 3 g in sodium chloride 0.9 % 100 mL IVPB        3 g 200 mL/hr over 30 Minutes Intravenous Every 6 hours 10/30/22 1530     10/26/22 2200  meropenem (MERREM) 2 g in sodium chloride 0.9 % 100 mL IVPB  Status:  Discontinued        2 g 280 mL/hr over 30 Minutes Intravenous Every 8 hours 10/26/22 1409 10/30/22 1530   10/26/22 1000  fluconazole (DIFLUCAN) tablet 100 mg       See Hyperspace for full Linked Orders Report.   100 mg Oral Daily 10/25/22 1412 10/31/22 1011   10/25/22 2000  doxycycline (VIBRAMYCIN) 100 mg in sodium chloride 0.9 % 250 mL IVPB  Status:  Discontinued        100 mg 125 mL/hr over 120 Minutes Intravenous Every 12 hours 10/25/22 1304 10/30/22 1530   10/25/22 1500  fluconazole (DIFLUCAN) tablet 200 mg       See Hyperspace for full  Linked Orders Report.   200 mg Oral Daily 10/25/22 1412 10/25/22 1718   10/25/22 1400  meropenem (MERREM) 2 g in sodium chloride 0.9 % 100 mL IVPB  Status:  Discontinued        2 g 280 mL/hr over 30 Minutes Intravenous Every 12 hours 10/25/22 1304 10/26/22 1409   10/25/22 1200  doxycycline (VIBRA-TABS) tablet 100 mg  Status:  Discontinued        100 mg Oral Every 12 hours 10/25/22 1103 10/25/22 1109   10/24/22 0045  cefTRIAXone (ROCEPHIN) 2 g in sodium chloride 0.9 % 100 mL IVPB  Status:  Discontinued  2 g 200 mL/hr over 30 Minutes Intravenous Daily at bedtime 10/24/22 0025 10/25/22 1102   10/24/22 0045  metroNIDAZOLE (FLAGYL) IVPB 500 mg  Status:  Discontinued        500 mg 100 mL/hr over 60 Minutes Intravenous 2 times daily 10/24/22 0025 10/25/22 1102   10/24/22 0030  doxycycline (VIBRA-TABS) tablet 100 mg  Status:  Discontinued        100 mg Oral Every 12 hours 10/24/22 0025 10/25/22 1102   10/24/22 0000  piperacillin-tazobactam (ZOSYN) IVPB 3.375 g  Status:  Discontinued        3.375 g 100 mL/hr over 30 Minutes Intravenous  Once 10/23/22 2348 10/24/22 0025        DVT prophylaxis: Lovenox  Code Status: Full code  Family Communication:    CONSULTS General surgery   Objective    Physical Examination:   General-appears in no acute distress Heart-S1-S2, regular, no murmur auscultated Lungs-clear to auscultation bilaterally, no wheezing or crackles auscultated Abdomen-soft, mild generalized tenderness palpation Extremities-no edema in the lower extremities Neuro-alert, oriented x3, no focal deficit noted   Status is: Inpatient:             Meredeth Ide   [facility] If 7PM-7AM, please contact night-coverage at [email or web address], Office  [phone]   11/05/2022, 1:41 PM  LOS: 12 days

## 2022-11-05 NOTE — Progress Notes (Signed)
PHARMACY - TOTAL PARENTERAL NUTRITION CONSULT NOTE   Indication: Small bowel obstruction and prolonged inability to tolerate PO intake  Patient Measurements: Height: 5\' 8"  (172.7 cm) Weight: (!) 164.8 kg (363 lb 5.1 oz) IBW/kg (Calculated) : 63.9 TPN AdjBW (KG): 86.4 Body mass index is 55.24 kg/m.  Assessment:  52 yo female with PMH of Bell's palsy, DM, CHF, hypothyroidism, hx DVT on Eliquis. Recently discharged on 3/21 after admission for abdominal pain and intractable nausea/vomiting. Brought back to ED on 3/24 for AMS and loss of consciousness. Endorsed abdominal pain and nausea/vomiting on day of admission. Pharmacy has been consulted on 4/3 to begin TPN as patient now with evidence of SBO on imaging and prolonged poor PO intake.   Discussed with patient on 4/3, she states that she really has not been able to tolerate a meal since before her previous admission ~3/16. Has a few juice cups and water cup on her tray, but states she is really only able to drink liquids when she is taking meds. Intake causes her to experience nausea and abdominal pain. Patient stated that just thinking about food/liquid/PO intake during our discussion made her feel nauseous.   Glucose / Insulin: hx T2DM, last A1c 7.4% 05/2022, on U-500 100 units TID, metformin, Jardiance, and Ozempic PTA >> was started on Semglee 100 units BID here but reduced down to Semglee 10 units QD on 4/1 for low BG's secondary to poor PO intake. Also on sSSI.  - CBGs <180 - 10 units of SSI/24h Electrolytes: CoCa 10.2, Mg 1.8, all others WNL and stable - Goal K >/= 4 and Mag >/= 2 Renal: Scr <1, BUN 10 Hepatic: TG 87, LFTs wnl Intake / Output: UOP not quantified but x1 occurrence, +15L this admission, LBM 4/2, drain output 45 mL MIVF: none GI Imaging: - 3/24 CTAP: inflammatory/infectious process such as PID w/ developing abscess, no bowel obstruction - 3/29 Abd X-ray: multiple dilated air-filled loops of small and large bowel, could  represent ileus or developing obstruction - 4/2 CTAP: new loculated rim enhancing fluid collection w/in the RLQ, several loops of distal small bowel, developing mid small bowel obstruction - 4/5 Abd x-ray: persistent dilated loops of small bowel which may reflect ileus or pSBO GI Surgeries / Procedures:  - 3/26: CT-guided left pelvic abscess drain placement  Central access: 4/3 TPN start date: 4/3  Nutritional Goals: Goal TPN rate is 90 mL/hr (provides 112 g of protein and 1988 kcals per day)  RD Assessment: Estimated Needs Total Energy Estimated Needs: 1900-2100 kcal/d Total Protein Estimated Needs: 100-120 g/d Total Fluid Estimated Needs: 2L/d  Current Nutrition:  NPO and TPN  Plan:  Continue TPN at 58mL/hr at 1800 - Watch volume status, can consider concentrating TPN if appears volume overloaded  Electrolytes in TPN: Na 52mEq/L, K 97mEq/L, Ca to 56mEq/L, Mg to 7 mEq/L to keep >/= 2, and Phos 68mmol/L. Cl:Ac 1:1 Add standard MVI and trace elements to TPN Continue Sensitive q4h SSI and adjust as needed  Continue Semglee 10 units SQ QD for now - holding off on adding to TPN  MIVF d/c with TPN at goal rate - further MIVF per MD Monitor TPN labs on Mon/Thurs, and daily until at goal rate Add thiamine 100mg  to TPN x 5 days for refeeding risk (start 4/3)  Rexford Maus, PharmD, BCPS 11/05/2022 7:17 AM

## 2022-11-05 NOTE — Progress Notes (Addendum)
Subjective/Chief Complaint: No change, some nausea, no emesis, flatus no bm   Objective: Vital signs in last 24 hours: Temp:  [98.2 F (36.8 C)-98.6 F (37 C)] 98.6 F (37 C) (04/06 0730) Pulse Rate:  [81-94] 81 (04/06 0355) Resp:  [13-18] 18 (04/06 0730) BP: (136-154)/(68-75) 136/68 (04/06 0730) SpO2:  [99 %-100 %] 100 % (04/06 0730) Last BM Date : 11/01/22  Intake/Output from previous day: 04/05 0701 - 04/06 0700 In: 0  Out: 25 [Drains:25] Intake/Output this shift: Total I/O In: -  Out: 500 [Urine:500]  Ab mild distended, drains in place with serosang fluid present, nontender  Lab Results:  Recent Labs    11/04/22 0755 11/05/22 0405  WBC 12.8* 14.5*  HGB 7.2* 7.4*  HCT 22.2* 23.3*  PLT 437* 457*   BMET Recent Labs    11/04/22 1130 11/05/22 0405  NA 140 137  K 3.6 4.3  CL 105 104  CO2 24 24  GLUCOSE 176* 177*  BUN 8 10  CREATININE 0.67 0.69  CALCIUM 8.3* 8.5*   PT/INR No results for input(s): "LABPROT", "INR" in the last 72 hours. ABG No results for input(s): "PHART", "HCO3" in the last 72 hours.  Invalid input(s): "PCO2", "PO2"  Studies/Results: DG Abd Portable 1V  Result Date: 11/05/2022 CLINICAL DATA:  [id number].  Small-bowel obstruction. EXAM: PORTABLE ABDOMEN - 1 VIEW COMPARISON:  11/04/2022 at 9:41 a.m. FINDINGS: 5:14 a.m. there are bilateral pigtail drainage catheters in the lower abdominopelvic quadrants. Contrast has partially eliminated from the dilated small bowel with contrast in the ascending colon faintly visible. Small bowel dilatation continues to measure up to 5 cm and is not significantly changed. Colonic gas is still seen through into the rectum with moderate fecal retention in the ascending and transverse colon. There is no supine evidence of free air.  Lung bases are clear. No pathologic calcifications or other significant findings are seen. IMPRESSION: 1. No significant change in small-bowel dilatation. Contrast has partially  eliminated from the dilated small bowel with contrast in the ascending colon faintly visible. 2. Moderate fecal retention in the ascending and transverse colon. Electronically Signed   By: Almira BarKeith  Chesser M.D.   On: 11/05/2022 07:45   DG Abd Portable 1V  Result Date: 11/04/2022 CLINICAL DATA:  Small-bowel obstruction. EXAM: PORTABLE ABDOMEN - 1 VIEW COMPARISON:  Radiographs 11/03/2022 and 11/01/2022.  CT 11/01/2022. FINDINGS: 0938 hours. Four supine views of the abdomen are submitted. There is a new percutaneous drain in the right lower quadrant. Existing percutaneous drain in the left lower quadrant is not significantly changed. Multiple moderately dilated loops of small bowel are again noted. There is some contrast material in the stomach and in the right lower quadrant, the latter probably within the colon. No extravasated contrast material identified. There is some gas in the colon. IMPRESSION: Persistent dilated loops of small bowel which may reflect an ileus or partial small bowel obstruction. New percutaneous drain in the right lower quadrant. No evidence of extravasated contrast material. Electronically Signed   By: Carey BullocksWilliam  Veazey M.D.   On: 11/04/2022 10:26   CT GUIDED PERITONEAL/RETROPERITONEAL FLUID DRAIN BY PERC CATH  Result Date: 11/03/2022 CLINICAL DATA:  Status post previous percutaneous catheter drainage of left-sided pelvic abscess on 10/25/2022. New intra-abdominal fluid collection in the right lower quadrant now requiring second percutaneous catheter drainage procedure. EXAM: CT GUIDED CATHETER DRAINAGE OF PERITONEAL ABSCESS ANESTHESIA/SEDATION: Moderate (conscious) sedation was employed during this procedure. A total of Versed 2.0 mg and Fentanyl 100 mcg  was administered intravenously. Moderate Sedation Time: 26 minutes. The patient's level of consciousness and vital signs were monitored continuously by radiology nursing throughout the procedure under my direct supervision. PROCEDURE: The  procedure, risks, benefits, and alternatives were explained to the patient. Questions regarding the procedure were encouraged and answered. The patient understands and consents to the procedure. A time out was performed prior to initiating the procedure. The right lower abdominal wall was prepped with chlorhexidine in a sterile fashion, and a sterile drape was applied covering the operative field. A sterile gown and sterile gloves were used for the procedure. Local anesthesia was provided with 1% Lidocaine. CT was performed in a supine position through the mid to lower abdomen and pelvis. After choosing a site for skin entry an 18 gauge trocar needle was advanced under CT guidance to the level of a right lower quadrant anterior peritoneal abscess. After confirming needle tip position, fluid was aspirated. A guidewire was advanced into the fluid collection and the needle removed. The tract was dilated over the wire and a 10 French percutaneous drainage catheter placed. Catheter position was confirmed by CT. The drainage catheter was further aspirated and a fluid sample sent for culture analysis. The drain was flushed and connected to a suction bulb. It was secured at the skin with a Prolene retention suture and adhesive retention device. RADIATION DOSE REDUCTION: This exam was performed according to the departmental dose-optimization program which includes automated exposure control, adjustment of the mA and/or kV according to patient size and/or use of iterative reconstruction technique. COMPLICATIONS: None FINDINGS: Fluid collection in the right lower quadrant again localizes lateral to bowel and measures roughly 4 cm in maximum diameter. The fluid collection in the left pelvis is completely decompressed by the previously placed percutaneous drainage catheter. After advancing a trocar needle into the collection, there was return of turbid, yellow fluid. After drainage catheter placement, a larger fluid sample was  withdrawn and sent for culture analysis. After drain placement, there is good return fluid. IMPRESSION: CT-guided percutaneous catheter drainage of right lower quadrant peritoneal abscess. A 10 French drainage catheter was placed and attached to suction bulb drainage. A sample of turbid yellow fluid was sent for culture analysis. Initial CT shows complete decompression of the left pelvic fluid collection after prior catheter drainage. Electronically Signed   By: Irish Lack M.D.   On: 11/03/2022 14:30   DG Abd Portable 1V-Small Bowel Obstruction Protocol-initial, 8 hr delay  Result Date: 11/03/2022 CLINICAL DATA:  Small-bowel obstruction EXAM: PORTABLE ABDOMEN - 1 VIEW COMPARISON:  CT 11/01/2022 FINDINGS: Oral contrast material appears to abut Mrs. There is diffuse small bowel dilatation, measuring up to 5.5-6.5 cm. Normal caliber colon with gaseous distension. Percutaneous drainage catheter noted in the left hemipelvis. IMPRESSION: Diffuse small bowel dilation compatible with small bowel obstruction. Colon is also distended to a degree, ileus is possible. Oral contrast noted in the stomach. Electronically Signed   By: Caprice Renshaw M.D.   On: 11/03/2022 12:10    Anti-infectives: Anti-infectives (From admission, onward)    Start     Dose/Rate Route Frequency Ordered Stop   10/30/22 2000  Ampicillin-Sulbactam (UNASYN) 3 g in sodium chloride 0.9 % 100 mL IVPB        3 g 200 mL/hr over 30 Minutes Intravenous Every 6 hours 10/30/22 1530     10/26/22 2200  meropenem (MERREM) 2 g in sodium chloride 0.9 % 100 mL IVPB  Status:  Discontinued  2 g 280 mL/hr over 30 Minutes Intravenous Every 8 hours 10/26/22 1409 10/30/22 1530   10/26/22 1000  fluconazole (DIFLUCAN) tablet 100 mg       See Hyperspace for full Linked Orders Report.   100 mg Oral Daily 10/25/22 1412 10/31/22 1011   10/25/22 2000  doxycycline (VIBRAMYCIN) 100 mg in sodium chloride 0.9 % 250 mL IVPB  Status:  Discontinued        100  mg 125 mL/hr over 120 Minutes Intravenous Every 12 hours 10/25/22 1304 10/30/22 1530   10/25/22 1500  fluconazole (DIFLUCAN) tablet 200 mg       See Hyperspace for full Linked Orders Report.   200 mg Oral Daily 10/25/22 1412 10/25/22 1718   10/25/22 1400  meropenem (MERREM) 2 g in sodium chloride 0.9 % 100 mL IVPB  Status:  Discontinued        2 g 280 mL/hr over 30 Minutes Intravenous Every 12 hours 10/25/22 1304 10/26/22 1409   10/25/22 1200  doxycycline (VIBRA-TABS) tablet 100 mg  Status:  Discontinued        100 mg Oral Every 12 hours 10/25/22 1103 10/25/22 1109   10/24/22 0045  cefTRIAXone (ROCEPHIN) 2 g in sodium chloride 0.9 % 100 mL IVPB  Status:  Discontinued        2 g 200 mL/hr over 30 Minutes Intravenous Daily at bedtime 10/24/22 0025 10/25/22 1102   10/24/22 0045  metroNIDAZOLE (FLAGYL) IVPB 500 mg  Status:  Discontinued        500 mg 100 mL/hr over 60 Minutes Intravenous 2 times daily 10/24/22 0025 10/25/22 1102   10/24/22 0030  doxycycline (VIBRA-TABS) tablet 100 mg  Status:  Discontinued        100 mg Oral Every 12 hours 10/24/22 0025 10/25/22 1102   10/24/22 0000  piperacillin-tazobactam (ZOSYN) IVPB 3.375 g  Status:  Discontinued        3.375 g 100 mL/hr over 30 Minutes Intravenous  Once 10/23/22 2348 10/24/22 0025       Assessment/Plan: Left tubo-ovarian abscess s/p IR drain placement 3/26  RLQ fluid collection concerning for abscess s/p drain SBO -Left tubo-ovarian abscess which was drained by IR 3/26. Culture w/ strep anginosis, GBS, prevotella bivia. She had a repeat CT scan 4/2 that showed a new 3.2 x 7.4 x 5.0 cm fluid collection in the RLQ. Appendix is not visualized but it would be odd for her to have perforated appendicitis in addition to a left tubo-ovarian abscess. S/p IR drain of second fluid collection on 4/4. Cx NGTD. ID is following and managing her antibiotics.  - CT scan also concerning for SBO. She has some contrast in her colon, likely ileus, can have  sips/chips for now and will follow.  Continue tpn -plan for repeat ct at some point early in week  FEN - IVF, NPO. TPN per primary. VTE - SCDs, hold eliquis. Okay for heparin gtt from our standpoint. Mobilize as able for bowel function. ID - unasyn, per ID Foley - none  I reviewed hospitalist notes, last 24 h vitals and pain scores, last 48 h intake and output, last 24 h labs and trends, and last 24 h imaging results.  This care required straight-forward level of medical decision making.    Emelia Loron 11/05/2022

## 2022-11-06 DIAGNOSIS — G9340 Encephalopathy, unspecified: Secondary | ICD-10-CM | POA: Diagnosis not present

## 2022-11-06 DIAGNOSIS — I5022 Chronic systolic (congestive) heart failure: Secondary | ICD-10-CM | POA: Diagnosis not present

## 2022-11-06 DIAGNOSIS — K651 Peritoneal abscess: Secondary | ICD-10-CM | POA: Diagnosis not present

## 2022-11-06 DIAGNOSIS — M329 Systemic lupus erythematosus, unspecified: Secondary | ICD-10-CM | POA: Diagnosis not present

## 2022-11-06 LAB — MAGNESIUM: Magnesium: 1.7 mg/dL (ref 1.7–2.4)

## 2022-11-06 LAB — CBC WITH DIFFERENTIAL/PLATELET
Abs Immature Granulocytes: 0.08 10*3/uL — ABNORMAL HIGH (ref 0.00–0.07)
Basophils Absolute: 0.1 10*3/uL (ref 0.0–0.1)
Basophils Relative: 0 %
Eosinophils Absolute: 0.8 10*3/uL — ABNORMAL HIGH (ref 0.0–0.5)
Eosinophils Relative: 6 %
HCT: 23 % — ABNORMAL LOW (ref 36.0–46.0)
Hemoglobin: 7.6 g/dL — ABNORMAL LOW (ref 12.0–15.0)
Immature Granulocytes: 1 %
Lymphocytes Relative: 20 %
Lymphs Abs: 2.7 10*3/uL (ref 0.7–4.0)
MCH: 28.8 pg (ref 26.0–34.0)
MCHC: 33 g/dL (ref 30.0–36.0)
MCV: 87.1 fL (ref 80.0–100.0)
Monocytes Absolute: 1.1 10*3/uL — ABNORMAL HIGH (ref 0.1–1.0)
Monocytes Relative: 9 %
Neutro Abs: 8.6 10*3/uL — ABNORMAL HIGH (ref 1.7–7.7)
Neutrophils Relative %: 64 %
Platelets: 439 10*3/uL — ABNORMAL HIGH (ref 150–400)
RBC: 2.64 MIL/uL — ABNORMAL LOW (ref 3.87–5.11)
RDW: 15 % (ref 11.5–15.5)
WBC: 13.3 10*3/uL — ABNORMAL HIGH (ref 4.0–10.5)
nRBC: 0.2 % (ref 0.0–0.2)

## 2022-11-06 LAB — GLUCOSE, CAPILLARY
Glucose-Capillary: 157 mg/dL — ABNORMAL HIGH (ref 70–99)
Glucose-Capillary: 168 mg/dL — ABNORMAL HIGH (ref 70–99)
Glucose-Capillary: 196 mg/dL — ABNORMAL HIGH (ref 70–99)
Glucose-Capillary: 198 mg/dL — ABNORMAL HIGH (ref 70–99)

## 2022-11-06 LAB — BASIC METABOLIC PANEL
Anion gap: 8 (ref 5–15)
BUN: 9 mg/dL (ref 6–20)
CO2: 24 mmol/L (ref 22–32)
Calcium: 8.4 mg/dL — ABNORMAL LOW (ref 8.9–10.3)
Chloride: 102 mmol/L (ref 98–111)
Creatinine, Ser: 0.66 mg/dL (ref 0.44–1.00)
GFR, Estimated: >60 mL/min (ref >60)
Glucose, Bld: 175 mg/dL — ABNORMAL HIGH (ref 70–99)
Potassium: 4.3 mmol/L (ref 3.5–5.1)
Sodium: 134 mmol/L — ABNORMAL LOW (ref 135–145)

## 2022-11-06 LAB — PHOSPHORUS: Phosphorus: 4.1 mg/dL (ref 2.5–4.6)

## 2022-11-06 MED ORDER — SODIUM CHLORIDE 0.9% FLUSH
5.0000 mL | Freq: Three times a day (TID) | INTRAVENOUS | Status: DC
  Administered 2022-11-06 – 2022-11-18 (×32): 5 mL

## 2022-11-06 MED ORDER — POLYETHYLENE GLYCOL 3350 17 G PO PACK
17.0000 g | PACK | Freq: Every day | ORAL | Status: DC
  Administered 2022-11-06 – 2022-11-19 (×9): 17 g via ORAL
  Filled 2022-11-06 (×11): qty 1

## 2022-11-06 MED ORDER — BISACODYL 10 MG RE SUPP
10.0000 mg | Freq: Once | RECTAL | Status: AC
  Administered 2022-11-06: 10 mg via RECTAL
  Filled 2022-11-06: qty 1

## 2022-11-06 MED ORDER — TRAVASOL 10 % IV SOLN
INTRAVENOUS | Status: AC
  Filled 2022-11-06: qty 1123.2

## 2022-11-06 NOTE — Progress Notes (Signed)
Subjective/Chief Complaint: No emesis, taking some sips, having a lot of flatus, no bm, feels better   Objective: Vital signs in last 24 hours: Temp:  [97.5 F (36.4 C)-98.7 F (37.1 C)] 97.5 F (36.4 C) (04/07 0418) Pulse Rate:  [73-92] 73 (04/07 0418) Resp:  [13-18] 13 (04/07 0418) BP: (139-158)/(64-84) 152/71 (04/07 0418) SpO2:  [96 %-100 %] 100 % (04/07 0418) Last BM Date : 11/04/22  Intake/Output from previous day: 04/06 0701 - 04/07 0700 In: 0  Out: 2863.5 [Urine:2850; Drains:13.5] Intake/Output this shift: No intake/output data recorded.    Lab Results:  Ab mild distended, drains in place with serosang fluid present, nontender   BMET Recent Labs    11/04/22 1130 11/05/22 0405  NA 140 137  K 3.6 4.3  CL 105 104  CO2 24 24  GLUCOSE 176* 177*  BUN 8 10  CREATININE 0.67 0.69  CALCIUM 8.3* 8.5*   PT/INR No results for input(s): "LABPROT", "INR" in the last 72 hours. ABG No results for input(s): "PHART", "HCO3" in the last 72 hours.  Invalid input(s): "PCO2", "PO2"  Studies/Results: DG Abd Portable 1V  Result Date: 11/05/2022 CLINICAL DATA:  416384.  Small-bowel obstruction. EXAM: PORTABLE ABDOMEN - 1 VIEW COMPARISON:  11/04/2022 at 9:41 a.m. FINDINGS: 5:14 a.m. there are bilateral pigtail drainage catheters in the lower abdominopelvic quadrants. Contrast has partially eliminated from the dilated small bowel with contrast in the ascending colon faintly visible. Small bowel dilatation continues to measure up to 5 cm and is not significantly changed. Colonic gas is still seen through into the rectum with moderate fecal retention in the ascending and transverse colon. There is no supine evidence of free air.  Lung bases are clear. No pathologic calcifications or other significant findings are seen. IMPRESSION: 1. No significant change in small-bowel dilatation. Contrast has partially eliminated from the dilated small bowel with contrast in the ascending colon  faintly visible. 2. Moderate fecal retention in the ascending and transverse colon. Electronically Signed   By: Almira Bar M.D.   On: 11/05/2022 07:45   DG Abd Portable 1V  Result Date: 11/04/2022 CLINICAL DATA:  Small-bowel obstruction. EXAM: PORTABLE ABDOMEN - 1 VIEW COMPARISON:  Radiographs 11/03/2022 and 11/01/2022.  CT 11/01/2022. FINDINGS: 0938 hours. Four supine views of the abdomen are submitted. There is a new percutaneous drain in the right lower quadrant. Existing percutaneous drain in the left lower quadrant is not significantly changed. Multiple moderately dilated loops of small bowel are again noted. There is some contrast material in the stomach and in the right lower quadrant, the latter probably within the colon. No extravasated contrast material identified. There is some gas in the colon. IMPRESSION: Persistent dilated loops of small bowel which may reflect an ileus or partial small bowel obstruction. New percutaneous drain in the right lower quadrant. No evidence of extravasated contrast material. Electronically Signed   By: Carey Bullocks M.D.   On: 11/04/2022 10:26    Anti-infectives: Anti-infectives (From admission, onward)    Start     Dose/Rate Route Frequency Ordered Stop   10/30/22 2000  Ampicillin-Sulbactam (UNASYN) 3 g in sodium chloride 0.9 % 100 mL IVPB        3 g 200 mL/hr over 30 Minutes Intravenous Every 6 hours 10/30/22 1530     10/26/22 2200  meropenem (MERREM) 2 g in sodium chloride 0.9 % 100 mL IVPB  Status:  Discontinued        2 g 280 mL/hr over 30  Minutes Intravenous Every 8 hours 10/26/22 1409 10/30/22 1530   10/26/22 1000  fluconazole (DIFLUCAN) tablet 100 mg       See Hyperspace for full Linked Orders Report.   100 mg Oral Daily 10/25/22 1412 10/31/22 1011   10/25/22 2000  doxycycline (VIBRAMYCIN) 100 mg in sodium chloride 0.9 % 250 mL IVPB  Status:  Discontinued        100 mg 125 mL/hr over 120 Minutes Intravenous Every 12 hours 10/25/22 1304  10/30/22 1530   10/25/22 1500  fluconazole (DIFLUCAN) tablet 200 mg       See Hyperspace for full Linked Orders Report.   200 mg Oral Daily 10/25/22 1412 10/25/22 1718   10/25/22 1400  meropenem (MERREM) 2 g in sodium chloride 0.9 % 100 mL IVPB  Status:  Discontinued        2 g 280 mL/hr over 30 Minutes Intravenous Every 12 hours 10/25/22 1304 10/26/22 1409   10/25/22 1200  doxycycline (VIBRA-TABS) tablet 100 mg  Status:  Discontinued        100 mg Oral Every 12 hours 10/25/22 1103 10/25/22 1109   10/24/22 0045  cefTRIAXone (ROCEPHIN) 2 g in sodium chloride 0.9 % 100 mL IVPB  Status:  Discontinued        2 g 200 mL/hr over 30 Minutes Intravenous Daily at bedtime 10/24/22 0025 10/25/22 1102   10/24/22 0045  metroNIDAZOLE (FLAGYL) IVPB 500 mg  Status:  Discontinued        500 mg 100 mL/hr over 60 Minutes Intravenous 2 times daily 10/24/22 0025 10/25/22 1102   10/24/22 0030  doxycycline (VIBRA-TABS) tablet 100 mg  Status:  Discontinued        100 mg Oral Every 12 hours 10/24/22 0025 10/25/22 1102   10/24/22 0000  piperacillin-tazobactam (ZOSYN) IVPB 3.375 g  Status:  Discontinued        3.375 g 100 mL/hr over 30 Minutes Intravenous  Once 10/23/22 2348 10/24/22 0025       Assessment/Plan: Left tubo-ovarian abscess s/p IR drain placement 3/26 RLQ fluid collection concerning for abscess s/p drain SBO vs ileus -Left tubo-ovarian abscess which was drained by IR 3/26. Culture w/ strep anginosis, GBS, prevotella bivia. She had a repeat CT scan 4/2 that showed a new 3.2 x 7.4 x 5.0 cm fluid collection in the RLQ. Appendix is not visualized but it would be odd for her to have perforated appendicitis in addition to a left tubo-ovarian abscess. S/p IR drain of second fluid collection on 4/4. Cx NGTD. ID is following and managing her antibiotics.  -I am not sure why for a TOA she has not been seen by gynecology.  Apparently there was communication with them and they did not think was TOA but imaging  etc does.  They should see patient.  - CT scan also concerning for SBO. She has some contrast in her colon, likely ileus, can have sips/chips for now and will follow.  Continue tpn. This is better and I think can try clears today -plan for repeat ct at some point early in week   FEN - IVF, NPO. TPN per primary. VTE - SCDs, hold eliquis. Okay for heparin gtt from our standpoint. Mobilize as able for bowel function. ID - unasyn, per ID Foley - none   I reviewed hospitalist notes, last 24 h vitals and pain scores, last 48 h intake and output, last 24 h labs and trends, and last 24 h imaging results.  This care required  moderate level of medical decision making.   [name] 11/06/2022

## 2022-11-06 NOTE — Progress Notes (Signed)
[name] NOTE    [name] [name]  RWE:[id number] DOB: 10-Jun-1971 DOA: 10/23/2022 PCP: [name] [name], [name]   Brief Narrative:  The patient is a 52 year old African-American female with past medical history of morbid obesity with BMI greater than [name] [name] CHF with previous ejection fraction of 30 to 35% and grade 1 diastolic dysfunction, chronic right ventricular failure, history of DVT, hypothyroidism, depression and anxiety and chronic abdominal pain who presented to the emergency room on 3/24 with complaints of lightheadedness and abdominal pain.  She was also found to be somewhat confused.  Workup revealed severe sepsis secondary to intra-abdominal abscess concerning for PID/TOA and case discussed with gynecology.  Mass not present on CT on 3/15.  Interventional radiology consulted and patient underwent drain placement 3/26.  Mentation much improved.   Patient had been on a limited diet full liquids due to complaints of abdominal pain.  Started having some nausea and vomiting 3/29.  Abdominal x-ray noted dilated small bowel loops concerning for developing ileus.  Symptoms have persisted and repeat x-ray on 3/30 confirms SBO versus ileus.  Patient has had a prolonged period without eating much, and with persistent ileus, started on PICC line and TPN 4/2.    Repeat CT scan done and showed a newly identified intra-abdominal abscess and so interventional radiology was consulted and they feel that she is a candidate for a repeat drain placement and so she has been made n.p.o. at midnight and her blood thinners are being held.  Plan is for vein placement in the a.m. with Dr. Fredia Sorrow.  General surgery was also consulted given her ileus versus a small bowel obstruction so general surgery recommended NG tube placement for decompression of small bowel obstruction protocol with Gastrografin delayed film however patient was not able to tolerate NG to placement.  SBO protocol was done via oral  Gastrografin and delayed films still pending.  She underwent drain placement yesterday for her new found abscess.   **Continues to have some abdominal pain but WBC is trending down.  General surgery is going to be repeating her KUB given that she had contrast in the stomach still and also had some diffuse small bowel dilation and colonic distention and colon and recommending continuing n.p.o. for now.  Will continue to mobilize the patient.  Nutritionist consulted and recommending continue TPN and not elucidated relatively until patient clinically systolic tolerating full code diet.  ID recommending continuing Unasyn and transition to Augmentin when able to tolerate p.o.  They recommend 4 weeks of antibiotics from the last aspiration on 11/03/2022.  Assessment and Plan:  Severe sepsis secondary to intra-abdominal abscess in patient with acute on chronic abdominal pain. -Patient met criteria for severe sepsis given fever, leukocytosis,  -AKI creatinine greater than 2 and abdominal source.   -CT abdomen shows evidence of a 5 x 5 cm fluid collection in the left hemipelvis concerning for developing abscess.  GYN was consulted.  Dr. Rito [name] had with Dr. Debroah [name]. Patient has not had sex in 4 years and the mass was not present on CT 3/15.   -GYN did not think that the patient is suffering from a tubo-ovarian abscess however General Surgery feels they should be seen for Tubo-ovarian abscess so will reach out to GYN for formal consultation and Dr. Earlene [name] is to evaluate later -Patient underwent IR guided drain placement on 3/26.  -Following drain placement and antibiotic adjustment, white blood cell count and procalcitonin initially improved and started to trend back upward.   -Now  with minimal output.  Intervention radiology following.   -Repeat CT Scan done and showed "Interval percutaneous drainage of the loculated inflammatory appearing fluid collection within the left adnexa with complete evacuation of the  previously noted loculated fluid. Extensive surrounding inflammatory change and poorly loculated fluid within the right adnexa persist. New loculated rim enhancing fluid collection within the right lower quadrant abutting several loops of a distal small bowel measuring 3.2 x 7.4 x 5.0 cm. Developing mid small bowel obstruction with a single point of transition identified within the mid abdomen. Minimal right nonobstructing nephrolithiasis. Moderate subcutaneous body wall edema." -WBC Trend:  Recent Labs  Lab 11/01/22 0532 11/01/22 1146 11/02/22 1053 11/03/22 1700 11/04/22 0755 11/05/22 0405 11/06/22 1123  WBC 22.2* 22.3* 18.3* 15.4* 12.8* 14.5* 13.3*  -Infectious Disease has been consulted and following recommending IV Unasyn and transitioning to p.o. Augmentin at the time of discharge on treating for 4 weeks since last drain placement -Given new abscess and a developing mid small bowel obstruction have consulted general surgery as well as interventional radiology and the plan is for another drain placement in her new abscess; drain was placed via CT-guided drainage of the right lower quadrant peritoneal fluid collection by Dr. Fredia Sorrow and she now has a 10 [place] drain the fluid sent for culture analysis currently showing no growth to date at 2 Days with no Anaerobes isolated   Syncope. Acute metabolic encephalopathy/hypersomnolence-resolved -Initially presented with what appears to be a syncopal event. There was a concern for seizures, however EEG was unremarkable.   -Ammonia level, TSH, HIV, RPR all unremarkable.   -VBG negative for hypercarbia.   -Neurology evaluated and felt findings unrelated to any neurological issue.  Initial concern this could be secondary to pain medication.   -Patient's IV pain medicine was held and she became more appropriate and alert.   -Felt to be multifactorial secondary to decreased clearance from acute kidney injury, additional anesthesia and pain medication  from intervention radiology for drain placement and IV pain medicine.   -By 3/28, patient much improved today despite getting continued pain medication is alert and oriented x 3 -Will need orthostatics prior to discharge and PT OT evaluation recommending SNF   ? UTI -Patient's white blood cell count initially improved following antibiotics and drain placement, down to 13 on 3/29.  -However, since then has been steadily rising, currently at 23.  Urine not very impressive.   -Negative cultures.  Discussed with infectious disease and they feel white count more likely related to SBO/ileus.   -See Above   Persistent ileus vs pSBO -Persisting.   -Treating underlying infection and electrolyte abnormalities.  Ice chips.   -Will have PT see to increase ambulation.   -Have added TPN due to prolonged period without nutrition.  -Rechecking abdominal CT for follow-up on abscess and as above -Now she has a developing mid small bowel obstruction with a single point of transition identified within the mid abdomen. -General surgery has been consulted and agree with IR to percutaneously drain the second fluid collection and recommending NG tube placement for decompression and small bowel protocol with Gastrografin delayed film however patient refused NG tube placement currently; she did not tolerate NG tube placement but small bowel protocol done orally and there were no follow-up on her delayed films.  If she has worsening abdominal pain nausea vomiting they are recommending reattempting NG tube placement and feel that this can hopefully improve with conservative management -X-Ray done this Am and showed "Persistent dilated  loops of small bowel which may reflect an ileus or partial small bowel obstruction. New percutaneous drain in the right lower quadrant. No evidence of extravasated contrast material." -General surgery recommending optimizing electrolytes mobilization and keeping n.p.o sleep but now that she is  doing better they recommended increasing from sips and chips to bring on a clear liquid diet today -History of.  CT scan of the abdomen pelvis at some point early next week   AKI, resolved. -Suspect ATN in the setting of sepsis, possibility of contrast-induced injury as well as prerenal etiology in the setting of poor p.o. intake.   -BUN/Cr Trend: Recent Labs  Lab 10/31/22 0426 11/01/22 0532 11/02/22 1053 11/03/22 0241 11/04/22 1130 11/05/22 0405 11/06/22 0615  BUN 10 7 <5* 5* 8 10 9   CREATININE 0.76 0.80 0.73 0.64 0.67 0.69 0.66  -Improved with IVF and now getting LR at 35 mL/hr as patient is on TPN as she is on TPN at 90 mL/hr -Continue to Monitor for S/Sx of Volume Overload  -Avoid Nephrotoxic Medications, Contrast Dyes, Hypotension and Dehydration to Ensure Adequate Renal Perfusion and will need to Renally Adjust Meds -Continue to Monitor and Trend Renal Function carefully and repeat CMP in the AM  -Monitor closely due to history of CHF.  Much improved by 3/28   Hypersomnolence. OHS. Obesity class III. -Complicates overall prognosis and care and patient at high risk for poor outome -Estimated body mass index is 55.24 kg/m as calculated from the following:   Height as of this encounter: 5\' 8"  (1.727 m).   Weight as of this encounter: 164.8 kg.  -Weight Loss and Dietary Counseling given -Patient was recommended to have an outpatient sleep evaluation during her recent hospitalization.   Chronic Combined Systolic and Diastolic CHF -Echocardiogram done during this hospitalization notes much improvement in ejection fraction and indeterminate diastolic function.   -BNP within normal limit.  Intake/Output Summary (Last 24 hours) at 11/06/2022 1513 Last data filed at 11/06/2022 1000 Gross per 24 hour  Intake 120 ml  Output 3207.5 ml  Net -3087.5 ml  -Continue to Monitor for S/Sx of Volume Overload -Currently appears euvolemic   Gastroparesis. -Patient is not on any medication  for gastroparesis.   -Was Tolerating p.o. but still reporting some nausea and vomiting.  -Intolerant of Reglan.   -Abdominal x-ray notes multiple dilated air-filled loops and repeat CT scan as above.   -Repeat KUB done and showed "Persistent dilated loops of small bowel which may reflect an ileus or partial small bowel obstruction. New percutaneous drain in the right lower quadrant. No evidence of extravasated contrast material." -Will replace electrolytes accordingly. -Currently on Zofran. -Will resume home scopolamine patch.   Hypokalemia -Patient's K+ Level Trend: Recent Labs  Lab 10/31/22 0426 11/01/22 0532 11/02/22 1053 11/03/22 0241 11/04/22 1130 11/05/22 0405 11/06/22 0615  K 3.5 3.6 3.2* 3.4* 3.6 4.3 4.3  -Replete with TPN -Continue to Monitor and Replete as Necessary -Repeat CMP in the AM    Hypomagnesemia -Patient's Mag Level Trend: Recent Labs  Lab 10/24/22 0643 11/01/22 0532 11/02/22 1053 11/03/22 0241 11/04/22 1130 11/05/22 0405 11/06/22 0615  MG 1.7 1.7 1.6* 2.0 1.7 1.8 1.7  -Continue to Monitor and Replete as Necessary with TPN -Repeat Mag in the AM    Severe Protein Calorie Malnutrition Nutrition Status: Nutrition Problem: Inadequate oral intake Etiology: poor appetite, nausea Signs/Symptoms: meal completion < 25%, NPO status, per patient/family report Interventions: Refer to RD note for recommendations   History of PE. -Had  a PE last October, told that she needs to be on Eliquis indefinitely.  Currently on Eliquis but will change to Heparin gtt given NPO overnight because heparin will at least 500 mL of fluid a day to her we will elect for enoxaparin 120 mg every 12   Type 2 diabetes mellitus, uncontrolled with hyperglycemia with gastroparesis. -With long-term insulin use. -Patient is actually on U-500 insulin at home. -Now with SBO/ileus, decreased long-acting insulin down to 10 units only plus Q for her sliding scale. -Continue monitor CBGs per  protocol -CBG Trend: Recent Labs  Lab 11/05/22 0821 11/05/22 1241 11/05/22 1550 11/05/22 2019 11/06/22 0040 11/06/22 0424 11/06/22 1459  GLUCAP 167* 178* 151* 133* 157* 168* 196*    Lupus and Sjogren's Syndrome. -Patient has on hydroxychloroquine, belimumab as well as Imuran which have all been held -Patient remains at risk for infections. -Continue Monitor for now.   Anxiety and depression. -Continue home regimen and C/w Quetiapine 200 mg po daily if able due to NPO status   Hypothyroidism. -Continue Levothyroxine [name] mcg po Daily but may need to change to IV given NPO status    Overactive bladder. -On Ditropan, currently held due to NPO   GERD. -Continue PPI with Pantoprazole 40 mg po Daily but will change to IV now given NPO status    Diabetic neuropathy. -Has been on Lyrica. -Due to encephalopathy it was held.  Restart slowly   Hyperlipidemia. -Holding Crestor while NPO with meds and sips and on TPN with ? SBO   Oral candidiasis. -Completed a week of Fluconazole.   Hypoalbuminemia -Patient's Albumin Trend: Recent Labs  Lab 10/26/22 0406 10/27/22 0724 10/28/22 0728 11/02/22 1053 11/03/22 0241 11/04/22 1130 11/05/22 0405  ALBUMIN 1.9* 2.0* 1.7* 1.7* 1.7* 1.8* 1.9*  -Continue to Monitor and Trend and repeat CMP in the AM   Obesity -Complicates overall prognosis and care -Estimated body mass index is 55.24 kg/m as calculated from the following:   Height as of this encounter: 5\' 8"  (1.727 m).   Weight as of this encounter: 164.8 kg.  -Weight Loss and Dietary Counseling given  DVT prophylaxis: Place and maintain sequential compression device Start: 10/25/22 1103    Code Status: Full Code Family Communication: No family currently at bedside  Disposition Plan:  Level of care: Telemetry Medical Status is: Inpatient Remains inpatient appropriate because: Needs further clinical improvement and clearance by specialist and will be getting a repeat CT scan  of the abdomen pelvis early this upcoming week   Consultants:  Infectious Diseases General Surgery Interventional Radiology  Gynecology   Procedures:  As delineated as above  Antimicrobials:  Anti-infectives (From admission, onward)    Start     Dose/Rate Route Frequency Ordered Stop   10/30/22 2000  Ampicillin-Sulbactam (UNASYN) 3 g in sodium chloride 0.9 % 100 mL IVPB        3 g 200 mL/hr over 30 Minutes Intravenous Every 6 hours 10/30/22 1530     10/26/22 2200  meropenem (MERREM) 2 g in sodium chloride 0.9 % 100 mL IVPB  Status:  Discontinued        2 g 280 mL/hr over 30 Minutes Intravenous Every 8 hours 10/26/22 1409 10/30/22 1530   10/26/22 1000  fluconazole (DIFLUCAN) tablet 100 mg       See Hyperspace for full Linked Orders Report.   100 mg Oral Daily 10/25/22 1412 10/31/22 1011   10/25/22 2000  doxycycline (VIBRAMYCIN) 100 mg in sodium chloride 0.9 % 250 mL  IVPB  Status:  Discontinued        100 mg 125 mL/hr over 120 Minutes Intravenous Every 12 hours 10/25/22 1304 10/30/22 1530   10/25/22 1500  fluconazole (DIFLUCAN) tablet 200 mg       See Hyperspace for full Linked Orders Report.   200 mg Oral Daily 10/25/22 1412 10/25/22 1718   10/25/22 1400  meropenem (MERREM) 2 g in sodium chloride 0.9 % 100 mL IVPB  Status:  Discontinued        2 g 280 mL/hr over 30 Minutes Intravenous Every 12 hours 10/25/22 1304 10/26/22 1409   10/25/22 1200  doxycycline (VIBRA-TABS) tablet 100 mg  Status:  Discontinued        100 mg Oral Every 12 hours 10/25/22 1103 10/25/22 1109   10/24/22 0045  cefTRIAXone (ROCEPHIN) 2 g in sodium chloride 0.9 % 100 mL IVPB  Status:  Discontinued        2 g 200 mL/hr over 30 Minutes Intravenous Daily at bedtime 10/24/22 0025 10/25/22 1102   10/24/22 0045  metroNIDAZOLE (FLAGYL) IVPB 500 mg  Status:  Discontinued        500 mg 100 mL/hr over 60 Minutes Intravenous 2 times daily 10/24/22 0025 10/25/22 1102   10/24/22 0030  doxycycline (VIBRA-TABS) tablet 100  mg  Status:  Discontinued        100 mg Oral Every 12 hours 10/24/22 0025 10/25/22 1102   10/24/22 0000  piperacillin-tazobactam (ZOSYN) IVPB 3.375 g  Status:  Discontinued        3.375 g 100 mL/hr over 30 Minutes Intravenous  Once 10/23/22 2348 10/24/22 0025       Subjective: Seen and examined at bedside and thinks her nausea is doing little bit better.  She denies any chest pain or shortness of breath.  Still has some abdominal discomfort but thinks is not as bad.  General Surgery will start her on clears given that she is feeling better and she states that she is passing flatus.  Objective: Vitals:   11/06/22 0741 11/06/22 0945 11/06/22 1122 11/06/22 1459  BP: (!) 174/81 (!) 162/82 (!) 161/82 (!) 147/76  Pulse: 81  79 82  Resp: 18  18 18   Temp: 98.4 F (36.9 C)  98.5 F (36.9 C) 98.5 F (36.9 C)  TempSrc: Oral  Oral Oral  SpO2: 100%  100% 98%  Weight:      Height:        Intake/Output Summary (Last 24 hours) at 11/06/2022 1507 Last data filed at 11/06/2022 1000 Gross per 24 hour  Intake 120 ml  Output 3207.5 ml  Net -3087.5 ml   Filed Weights   11/01/22 0600 11/02/22 0500 11/03/22 0500  Weight: (!) 165.4 kg (!) 164 kg (!) 164.8 kg   Examination: Physical Exam:  Constitutional: WN/WD super morbidly obese African-American female who appears little more comfortable Respiratory: Diminished to auscultation bilaterally, no wheezing, rales, rhonchi or crackles. Normal respiratory effort and patient is not tachypenic. No accessory muscle use.  Unlabored breathing Cardiovascular: RRR, no murmurs / rubs / gallops. S1 and S2 auscultated.  No appreciable extremity edema Abdomen: Soft, tender to palpate and and is distended secondary to body habitus.  Has 2 drains in place. Bowel sounds positive but a little diminished.  GU: Deferred. Musculoskeletal: No clubbing / cyanosis of digits/nails. No joint deformity upper and lower extremities.   Skin: No rashes, lesions, ulcers on limited  skin evaluation. No induration; Warm and dry.  Neurologic: CN 2-12  grossly intact with no focal deficits. Romberg sign and cerebellar reflexes not assessed.  Psychiatric: Normal judgment and insight. Alert and oriented x 3. Normal mood and appropriate affect.   Data Reviewed: I have personally reviewed following labs and imaging studies  CBC: Recent Labs  Lab 11/01/22 1146 11/02/22 1053 11/03/22 1700 11/04/22 0755 11/05/22 0405 11/06/22 1123  WBC 22.3* 18.3* 15.4* 12.8* 14.5* 13.3*  NEUTROABS 15.7*  --  10.5* 8.5* 9.0* 8.6*  HGB 8.0* 7.9* 7.4* 7.2* 7.4* 7.6*  HCT 23.3* 22.9* 22.8* 22.2* 23.3* 23.0*  MCV 85.0 84.5 89.1 94.9 87.6 87.1  PLT 456* 473* 480* 437* 457* 439*   Basic Metabolic Panel: Recent Labs  Lab 11/02/22 1053 11/03/22 0241 11/04/22 1130 11/05/22 0405 11/06/22 0615  NA 138 134* 140 137 134*  K 3.2* 3.4* 3.6 4.3 4.3  CL 105 107 105 104 102  CO2 GLUCOSE 130* 133* 176* 177* 175*  BUN <5* 5* CREATININE 0.73 0.64 0.67 0.69 0.66  CALCIUM 8.5* 7.9* 8.3* 8.5* 8.4*  MG 1.6* 2.0 1.7 1.8 1.7  PHOS 2.5 3.0 3.3 3.7 4.1   GFR: Estimated Creatinine Clearance: 135.4 mL/min (by C-G formula based on SCr of 0.66 mg/dL). Liver Function Tests: Recent Labs  Lab 11/02/22 1053 11/03/22 0241 11/04/22 1130 11/05/22 0405  AST 12* 12* 14* 18  ALT ALKPHOS 111 108 102 98  BILITOT 0.8 0.6 0.2* 0.3  PROT 5.8* 5.5* 5.6* 5.8*  ALBUMIN 1.7* 1.7* 1.8* 1.9*   No results for input(s): "LIPASE", "AMYLASE" in the last 168 hours. No results for input(s): "AMMONIA" in the last 168 hours. Coagulation Profile: No results for input(s): "INR", "PROTIME" in the last 168 hours. Cardiac Enzymes: No results for input(s): "CKTOTAL", "CKMB", "CKMBINDEX", "TROPONINI" in the last 168 hours. BNP (last 3 results) No results for input(s): "PROBNP" in the last 8760 hours. HbA1C: No results for input(s): "HGBA1C" in the last 72 hours. CBG: Recent Labs  Lab  11/05/22 1550 11/05/22 2019 11/06/22 0040 11/06/22 0424 11/06/22 1459  GLUCAP 151* 133* 157* 168* 196*   Lipid Profile: No results for input(s): "CHOL", "HDL", "LDLCALC", "TRIG", "CHOLHDL", "LDLDIRECT" in the last 72 hours. Thyroid Function Tests: No results for input(s): "TSH", "T4TOTAL", "FREET4", "T3FREE", "THYROIDAB" in the last 72 hours. Anemia Panel: No results for input(s): "VITAMINB12", "FOLATE", "FERRITIN", "TIBC", "IRON", "RETICCTPCT" in the last 72 hours. Sepsis Labs: Recent Labs  Lab 10/31/22 0426 11/01/22 0532 11/02/22 0500  PROCALCITON 0.45 0.32 0.27    Recent Results (from the past 240 hour(s))  Urine Culture     Status: Abnormal   Collection Time: 10/30/22  3:00 AM   Specimen: Urine, Random  Result Value Ref Range Status   Specimen Description URINE, RANDOM  Final   Special Requests NONE Reflexed from [id number]  Final   Culture (A)  Final    <10,000 COLONIES/mL INSIGNIFICANT GROWTH Performed at [facility], [address] [place], [place] [address]    Report Status 10/31/2022 FINAL  Final  Aerobic/Anaerobic Culture w Gram Stain (surgical/deep wound)     Status: None (Preliminary result)   Collection Time: 11/03/22  1:08 PM   Specimen: Abscess  Result Value Ref Range Status   Specimen Description ABSCESS  Final   Special Requests Normal  Final   Gram Stain   Final    ABUNDANT WBC PRESENT, PREDOMINANTLY PMN NO ORGANISMS SEEN    Culture   Final  NO GROWTH 2 DAYS NO ANAEROBES ISOLATED; CULTURE IN [name] FOR 5 DAYS Performed at [facility], [address] [place], [place] [address]    Report Status PENDING  Incomplete     Radiology Studies: DG Abd Portable 1V  Result Date: 11/05/2022 CLINICAL DATA:  [id number].  Small-bowel obstruction. EXAM: PORTABLE ABDOMEN - 1 VIEW COMPARISON:  11/04/2022 at 9:41 a.m. FINDINGS: 5:14 a.m. there are bilateral pigtail drainage catheters in the lower abdominopelvic quadrants. Contrast has partially  eliminated from the dilated small bowel with contrast in the ascending colon faintly visible. Small bowel dilatation continues to measure up to 5 cm and is not significantly changed. Colonic gas is still seen through into the rectum with moderate fecal retention in the ascending and transverse colon. There is no supine evidence of free air.  Lung bases are clear. No pathologic calcifications or other significant findings are seen. IMPRESSION: 1. No significant change in small-bowel dilatation. Contrast has partially eliminated from the dilated small bowel with contrast in the ascending colon faintly visible. 2. Moderate fecal retention in the ascending and transverse colon. Electronically Signed   By: Almira Bar M.D.   On: 11/05/2022 07:45    Scheduled Meds:  carvedilol  3.125 mg Oral BID WC   Chlorhexidine Gluconate Cloth  6 each Topical Daily   enoxaparin (LOVENOX) injection  120 mg Subcutaneous Q12H   insulin aspart  0-9 Units Subcutaneous Q4H   insulin glargine-yfgn  10 Units Subcutaneous Daily   [START ON 11/11/2022] levothyroxine  40 mcg Intravenous Daily   pantoprazole (PROTONIX) IV  40 mg Intravenous Q24H   polyethylene glycol  17 g Oral Daily   QUEtiapine  200 mg Oral QHS   sodium chloride flush  3 mL Intravenous Q12H   sodium chloride flush  5 mL Intracatheter Q8H   sodium chloride flush  5 mL Intracatheter Q8H   Continuous Infusions:  ampicillin-sulbactam (UNASYN) IV 3 g (11/06/22 1343)   TPN ADULT (ION) 90 mL/hr at 11/05/22 1804   TPN ADULT (ION)      LOS: 13 days   Marguerita Merles, DO [facility] Available via Epic secure chat 7am-7pm After these hours, please refer to coverage provider listed on [email or web address] 11/06/2022, 3:07 PM

## 2022-11-06 NOTE — Progress Notes (Signed)
PHARMACY - TOTAL PARENTERAL NUTRITION CONSULT NOTE   Indication: Small bowel obstruction and prolonged inability to tolerate PO intake  Patient Measurements: Height: 5\' 8"  (172.7 cm) Weight: (!) 164.8 kg (363 lb 5.1 oz) IBW/kg (Calculated) : 63.9 TPN AdjBW (KG): 86.4 Body mass index is 55.24 kg/m.  Assessment:  52 yo female with PMH of Bell's palsy, DM, CHF, hypothyroidism, hx DVT on Eliquis. Recently discharged on 3/21 after admission for abdominal pain and intractable nausea/vomiting. Brought back to ED on 3/24 for AMS and loss of consciousness. Endorsed abdominal pain and nausea/vomiting on day of admission. Pharmacy has been consulted on 4/3 to begin TPN as patient now with evidence of SBO on imaging and prolonged poor PO intake.   Discussed with patient on 4/3, she states that she really has not been able to tolerate a meal since before her previous admission ~3/16. Has a few juice cups and water cup on her tray, but states she is really only able to drink liquids when she is taking meds. Intake causes her to experience nausea and abdominal pain. Patient stated that just thinking about food/liquid/PO intake during our discussion made her feel nauseous.   Glucose / Insulin: hx T2DM, last A1c 7.4% 05/2022, on U-500 100 units TID, metformin, Jardiance, and Ozempic PTA >> was started on Semglee 100 units BID here but reduced down to Semglee 10 units QD on 4/1 for low BG's secondary to poor PO intake. Also on sSSI.  - CBGs <180 - 10 units of SSI/24h Electrolytes: Na 134, Mg 1.7, all others WNL and stable - Goal K >/= 4 and Mag >/= 2 Renal: Scr <1, BUN 10 Hepatic: 4/6: LFTs wnl, Tgs 87 Intake / Output: UOP , +12L this admission, LBM 4/5, drain output 13 mL MIVF: none GI Imaging: - 3/24 CTAP: inflammatory/infectious process such as PID w/ developing abscess, no bowel obstruction - 3/29 Abd X-ray: multiple dilated air-filled loops of small and large bowel, could represent ileus or  developing obstruction - 4/2 CTAP: new loculated rim enhancing fluid collection w/in the RLQ, several loops of distal small bowel, developing mid small bowel obstruction - 4/5 Abd x-ray: persistent dilated loops of small bowel which may reflect ileus or pSBO - 4/6 Abd Xray: no significant change in small bowel dilation GI Surgeries / Procedures:  - 3/26: CT-guided left pelvic abscess drain placement  Central access: 4/3 TPN start date: 4/3  Nutritional Goals: Goal TPN rate is 90 mL/hr (provides 112 g of protein and 1988 kcals per day)  RD Assessment: Estimated Needs Total Energy Estimated Needs: 1900-2100 kcal/d Total Protein Estimated Needs: 100-120 g/d Total Fluid Estimated Needs: 2L/d  Current Nutrition:  NPO and TPN  Plan:  Continue TPN at 61mL/hr at 1800 - Watch volume status, can consider concentrating TPN if appears volume overloaded  Electrolytes in TPN: Na to 79mEq/L, K 49mEq/L, Ca to 5mEq/L, Mg to 8 mEq/L to keep >/= 2, and Phos 63mmol/L. Cl:Ac 1:1 Add standard MVI and trace elements to TPN Continue Sensitive q4h SSI and adjust as needed  Continue Semglee 10 units SQ QD for now - holding off on adding to TPN since current req's much lower than BL due to prev hypoglycemia MIVF d/c with TPN at goal rate - further MIVF per MD Monitor TPN labs on Mon/Thurs and PRN Add thiamine 100mg  to TPN x 5 days for refeeding risk (last day 4/7) F/u ability to advance diet - trying clears today  Rexford Maus, PharmD, BCPS 11/06/2022 9:40 AM

## 2022-11-06 NOTE — Consult Note (Signed)
Gyn Consult  [name] is a 52yo AA female [phone] with MMP admitted with pelvic abscess.  She is a long term patient and our practice was unaware of her admission until today 11/06/22.  She has multiple medical problems including but not limited to: Diabetes Mellitus 2, h/o PE, Liver mass, Lupus, CHF, COPD, history of DUB and endometrial hyperplasia.  She had an endometrial biopsy several years ago was diagnosed with endometrial hyperplasia, is a poor surgical candidate and has been on Megesterol 40mg  bid to medically manage.  Endometrial biopsy in 09/2020 supported continuation of medical management of hyperplasia as was benign. Since this medication she has not had bleeding.  She is not currently sexually active as her partner died nearly two years ago. She missed her yearly exam this year due to current hospitalization.  Pt states has had no vaginal bleeding or unusual discharge.  Was treated for vulvovaginal candidiasis in 8 and 9/23.    IR placed drain on her left 3/26, a repeat scan 4/2 showed this collection gone, but a new collection of fluid in her R adnexa abutting her bowel.  Another drain was placed on R.  States she is sore from drains, but overall feeling better, except some nausea after clears.    All: hydrocodone, oxycodone, PCN, percocet Meds: per list and Megace 40 bid, was taking Nuvaring for estrogen but taken off due to liver mass  POBGyn: LMP 9/18 MMG 1/22 not at office Pap 09/2020 ASCUS HR HPV neg - repeat 2025 Remote h/o Chl age 12 [id number] SAB, ectopic SVD female 8#9  FH DM, HTN, ovarian cancer, CVA, colon CA, breast CA, stomach CA  SH: single, disability, forner tobacco user, no ETOH, no drugs  PSH: hernia, toe sx, muscle bx, ectopic pregnancy treatment  PMH: HTN, CHF, PE, COPD, lupus  ROS + flatus, + nausea, no VB/discharge  PE: deferred Gen unwell obese woman in bed, cooperative Neuro A&Ox3 Drain on R seems to be draining sm clear fluid in JP drain, L drain  thicker, sanguinous pus in bag  52yo female with pelvic abscess, being drained by IR H/o endometrial hyperplasia - longterm medical management with megesterol 40 bid 2. Current abx include Unasyn, reviewed with pharmacy, Would consider adding doxycycline 100mg  IV/po q 12 for total of 10-14 days.  When tolerating po change Augmentin XR 2g bid for total on 10-14days 3. Would recommend TVUS to better evaluate pelvis  Please call with questions or problems, will continue to follow [employer] [phone] [employer] [phone]

## 2022-11-06 NOTE — Plan of Care (Signed)
  Problem: Education: Goal: Ability to describe self-care measures that may prevent or decrease complications (Diabetes Survival Skills Education) will improve Outcome: Progressing Goal: Individualized Educational Video(s) Outcome: Progressing   Problem: Coping: Goal: Ability to adjust to condition or change in health will improve Outcome: Progressing   Problem: Fluid Volume: Goal: Ability to maintain a balanced intake and output will improve Outcome: Progressing   Problem: Health Behavior/Discharge Planning: Goal: Ability to identify and utilize available resources and services will improve Outcome: Progressing Goal: Ability to manage health-related needs will improve Outcome: Not Progressing   Problem: Metabolic: Goal: Ability to maintain appropriate glucose levels will improve Outcome: Progressing   Problem: Nutritional: Goal: Maintenance of adequate nutrition will improve Outcome: Progressing Goal: Progress toward achieving an optimal weight will improve Outcome: Progressing   Problem: Skin Integrity: Goal: Risk for impaired skin integrity will decrease Outcome: Progressing   Problem: Tissue Perfusion: Goal: Adequacy of tissue perfusion will improve Outcome: Progressing   Problem: Education: Goal: Knowledge of General Education information will improve Description: Including pain rating scale, medication(s)/side effects and non-pharmacologic comfort measures Outcome: Progressing   Problem: Health Behavior/Discharge Planning: Goal: Ability to manage health-related needs will improve Outcome: Progressing   Problem: Clinical Measurements: Goal: Ability to maintain clinical measurements within normal limits will improve Outcome: Progressing Goal: Will remain free from infection Outcome: Progressing Goal: Diagnostic test results will improve Outcome: Progressing Goal: Respiratory complications will improve Outcome: Progressing Goal: Cardiovascular complication  will be avoided Outcome: Progressing   Problem: Nutrition: Goal: Adequate nutrition will be maintained Outcome: Progressing   Problem: Coping: Goal: Level of anxiety will decrease Outcome: Progressing

## 2022-11-07 ENCOUNTER — Inpatient Hospital Stay (HOSPITAL_COMMUNITY): Payer: Medicaid Other

## 2022-11-07 DIAGNOSIS — G9340 Encephalopathy, unspecified: Secondary | ICD-10-CM | POA: Diagnosis not present

## 2022-11-07 DIAGNOSIS — I5022 Chronic systolic (congestive) heart failure: Secondary | ICD-10-CM | POA: Diagnosis not present

## 2022-11-07 DIAGNOSIS — M329 Systemic lupus erythematosus, unspecified: Secondary | ICD-10-CM | POA: Diagnosis not present

## 2022-11-07 DIAGNOSIS — K651 Peritoneal abscess: Secondary | ICD-10-CM | POA: Diagnosis not present

## 2022-11-07 LAB — COMPREHENSIVE METABOLIC PANEL
ALT: 13 U/L (ref 0–44)
AST: 16 U/L (ref 15–41)
Albumin: 2.1 g/dL — ABNORMAL LOW (ref 3.5–5.0)
Alkaline Phosphatase: 105 U/L (ref 38–126)
Anion gap: 6 (ref 5–15)
BUN: 9 mg/dL (ref 6–20)
CO2: 25 mmol/L (ref 22–32)
Calcium: 8.5 mg/dL — ABNORMAL LOW (ref 8.9–10.3)
Chloride: 103 mmol/L (ref 98–111)
Creatinine, Ser: 0.64 mg/dL (ref 0.44–1.00)
GFR, Estimated: >60 mL/min (ref >60)
Glucose, Bld: 192 mg/dL — ABNORMAL HIGH (ref 70–99)
Potassium: 4.2 mmol/L (ref 3.5–5.1)
Sodium: 134 mmol/L — ABNORMAL LOW (ref 135–145)
Total Bilirubin: 0.6 mg/dL (ref 0.3–1.2)
Total Protein: 6.1 g/dL — ABNORMAL LOW (ref 6.5–8.1)

## 2022-11-07 LAB — PHOSPHORUS: Phosphorus: 4.4 mg/dL (ref 2.5–4.6)

## 2022-11-07 LAB — CBC WITH DIFFERENTIAL/PLATELET
Abs Immature Granulocytes: 0.13 10*3/uL — ABNORMAL HIGH (ref 0.00–0.07)
Basophils Absolute: 0.1 10*3/uL (ref 0.0–0.1)
Basophils Relative: 1 %
Eosinophils Absolute: 0.9 10*3/uL — ABNORMAL HIGH (ref 0.0–0.5)
Eosinophils Relative: 6 %
HCT: 27 % — ABNORMAL LOW (ref 36.0–46.0)
Hemoglobin: 8.9 g/dL — ABNORMAL LOW (ref 12.0–15.0)
Immature Granulocytes: 1 %
Lymphocytes Relative: 24 %
Lymphs Abs: 3.6 10*3/uL (ref 0.7–4.0)
MCH: 28.7 pg (ref 26.0–34.0)
MCHC: 33 g/dL (ref 30.0–36.0)
MCV: 87.1 fL (ref 80.0–100.0)
Monocytes Absolute: 1.3 10*3/uL — ABNORMAL HIGH (ref 0.1–1.0)
Monocytes Relative: 9 %
Neutro Abs: 8.7 10*3/uL — ABNORMAL HIGH (ref 1.7–7.7)
Neutrophils Relative %: 59 %
Platelets: 489 10*3/uL — ABNORMAL HIGH (ref 150–400)
RBC: 3.1 MIL/uL — ABNORMAL LOW (ref 3.87–5.11)
RDW: 15.5 % (ref 11.5–15.5)
WBC: 14.7 10*3/uL — ABNORMAL HIGH (ref 4.0–10.5)
nRBC: 0 % (ref 0.0–0.2)

## 2022-11-07 LAB — GLUCOSE, CAPILLARY
Glucose-Capillary: 166 mg/dL — ABNORMAL HIGH (ref 70–99)
Glucose-Capillary: 220 mg/dL — ABNORMAL HIGH (ref 70–99)
Glucose-Capillary: 193 mg/dL — ABNORMAL HIGH (ref 70–99)
Glucose-Capillary: 168 mg/dL — ABNORMAL HIGH (ref 70–99)
Glucose-Capillary: 187 mg/dL — ABNORMAL HIGH (ref 70–99)
Glucose-Capillary: 166 mg/dL — ABNORMAL HIGH (ref 70–99)

## 2022-11-07 LAB — TRIGLYCERIDES: Triglycerides: 71 mg/dL (ref <150)

## 2022-11-07 LAB — MAGNESIUM: Magnesium: 1.8 mg/dL (ref 1.7–2.4)

## 2022-11-07 MED ORDER — SENNOSIDES-DOCUSATE SODIUM 8.6-50 MG PO TABS
1.0000 | ORAL_TABLET | Freq: Every day | ORAL | Status: DC
  Administered 2022-11-07 – 2022-11-12 (×5): 1 via ORAL
  Filled 2022-11-07 (×7): qty 1

## 2022-11-07 MED ORDER — MAGNESIUM SULFATE 2 GM/50ML IV SOLN
2.0000 g | Freq: Once | INTRAVENOUS | Status: AC
  Administered 2022-11-07: 2 g via INTRAVENOUS
  Filled 2022-11-07: qty 50

## 2022-11-07 MED ORDER — SODIUM CHLORIDE 0.9 % IV SOLN
100.0000 mg | Freq: Two times a day (BID) | INTRAVENOUS | Status: DC
  Administered 2022-11-07 – 2022-11-11 (×9): 100 mg via INTRAVENOUS
  Filled 2022-11-07 (×10): qty 100

## 2022-11-07 MED ORDER — TRAVASOL 10 % IV SOLN
INTRAVENOUS | Status: AC
  Filled 2022-11-07: qty 1123.2

## 2022-11-07 MED ORDER — INSULIN ASPART 100 UNIT/ML IJ SOLN
0.0000 [IU] | INTRAMUSCULAR | Status: DC
  Administered 2022-11-07 (×3): 4 [IU] via SUBCUTANEOUS
  Administered 2022-11-08: 3 [IU] via SUBCUTANEOUS
  Administered 2022-11-08 – 2022-11-09 (×7): 4 [IU] via SUBCUTANEOUS
  Administered 2022-11-09: 3 [IU] via SUBCUTANEOUS
  Administered 2022-11-09: 4 [IU] via SUBCUTANEOUS
  Administered 2022-11-09 – 2022-11-10 (×2): 7 [IU] via SUBCUTANEOUS
  Administered 2022-11-10 – 2022-11-11 (×6): 4 [IU] via SUBCUTANEOUS
  Administered 2022-11-11: 7 [IU] via SUBCUTANEOUS
  Administered 2022-11-11: 4 [IU] via SUBCUTANEOUS
  Administered 2022-11-11: 7 [IU] via SUBCUTANEOUS
  Administered 2022-11-11: 5 [IU] via SUBCUTANEOUS
  Administered 2022-11-12 – 2022-11-13 (×9): 4 [IU] via SUBCUTANEOUS
  Administered 2022-11-13: 3 [IU] via SUBCUTANEOUS
  Administered 2022-11-13 – 2022-11-14 (×4): 4 [IU] via SUBCUTANEOUS
  Administered 2022-11-14: 3 [IU] via SUBCUTANEOUS
  Administered 2022-11-14 (×3): 4 [IU] via SUBCUTANEOUS
  Administered 2022-11-15: 3 [IU] via SUBCUTANEOUS
  Administered 2022-11-15: 4 [IU] via SUBCUTANEOUS
  Administered 2022-11-15: 3 [IU] via SUBCUTANEOUS
  Administered 2022-11-15: 4 [IU] via SUBCUTANEOUS
  Administered 2022-11-15: 3 [IU] via SUBCUTANEOUS
  Administered 2022-11-16 (×3): 4 [IU] via SUBCUTANEOUS
  Administered 2022-11-16: 3 [IU] via SUBCUTANEOUS
  Administered 2022-11-16: 4 [IU] via SUBCUTANEOUS
  Administered 2022-11-16: 3 [IU] via SUBCUTANEOUS
  Administered 2022-11-17: 7 [IU] via SUBCUTANEOUS
  Administered 2022-11-17 (×2): 4 [IU] via SUBCUTANEOUS
  Administered 2022-11-17: 3 [IU] via SUBCUTANEOUS
  Administered 2022-11-17: 4 [IU] via SUBCUTANEOUS
  Administered 2022-11-18 (×2): 3 [IU] via SUBCUTANEOUS
  Administered 2022-11-18: 4 [IU] via SUBCUTANEOUS
  Administered 2022-11-18 (×2): 3 [IU] via SUBCUTANEOUS
  Administered 2022-11-18: 4 [IU] via SUBCUTANEOUS
  Administered 2022-11-19 (×2): 3 [IU] via SUBCUTANEOUS
  Administered 2022-11-19 (×2): 4 [IU] via SUBCUTANEOUS

## 2022-11-07 NOTE — Plan of Care (Signed)
  Problem: Education: Goal: Ability to describe self-care measures that may prevent or decrease complications (Diabetes Survival Skills Education) will improve Outcome: Progressing   

## 2022-11-07 NOTE — Progress Notes (Addendum)
Subjective/Chief Complaint: Tolerated clears without n/v and had BM last night after suppository. Less crampy abdominal pain after that but still sore. Passing flatus  Objective: Vital signs in last 24 hours: Temp:  [98.3 F (36.8 C)-98.9 F (37.2 C)] 98.3 F (36.8 C) (04/08 0323) Pulse Rate:  [79-95] 81 (04/08 0323) Resp:  [15-18] 16 (04/08 0323) BP: (139-167)/(71-83) 139/73 (04/08 0323) SpO2:  [95 %-100 %] 99 % (04/08 0323) Last BM Date : 11/06/22  Intake/Output from previous day: 04/07 0701 - 04/08 0700 In: 1043.8 [P.O.:120; I.V.:923.8] Out: 3952.5 [Urine:3950; Drains:2.5] Intake/Output this shift: No intake/output data recorded.   PE: General: pleasant, WD, female who is laying in bed in NAD Lungs:   Respiratory effort nonlabored Abd: soft, mild TTP suprapubic abdomen without rebound or guarding. RLQ drain without output. LLQ drain with scant cloudy SS drainage MSK: all 4 extremities are symmetrical with no cyanosis, clubbing, or edema. Skin: warm and dry Psych: A&Ox3 with an appropriate affect.    BMET Recent Labs    11/06/22 0615 11/07/22 0455  NA 134* 134*  K 4.3 4.2  CL 102 103  CO2 24 25  GLUCOSE 175* 192*  BUN 9 9  CREATININE 0.66 0.64  CALCIUM 8.4* 8.5*    PT/INR No results for input(s): "LABPROT", "INR" in the last 72 hours. ABG No results for input(s): "PHART", "HCO3" in the last 72 hours.  Invalid input(s): "PCO2", "PO2"  Studies/Results: No results found.  Anti-infectives: Anti-infectives (From admission, onward)    Start     Dose/Rate Route Frequency Ordered Stop   10/30/22 2000  Ampicillin-Sulbactam (UNASYN) 3 g in sodium chloride 0.9 % 100 mL IVPB        3 g 200 mL/hr over 30 Minutes Intravenous Every 6 hours 10/30/22 1530     10/26/22 2200  meropenem (MERREM) 2 g in sodium chloride 0.9 % 100 mL IVPB  Status:  Discontinued        2 g 280 mL/hr over 30 Minutes Intravenous Every 8 hours 10/26/22 1409 10/30/22 1530   10/26/22  1000  fluconazole (DIFLUCAN) tablet 100 mg       See Hyperspace for full Linked Orders Report.   100 mg Oral Daily 10/25/22 1412 10/31/22 1011   10/25/22 2000  doxycycline (VIBRAMYCIN) 100 mg in sodium chloride 0.9 % 250 mL IVPB  Status:  Discontinued        100 mg 125 mL/hr over 120 Minutes Intravenous Every 12 hours 10/25/22 1304 10/30/22 1530   10/25/22 1500  fluconazole (DIFLUCAN) tablet 200 mg       See Hyperspace for full Linked Orders Report.   200 mg Oral Daily 10/25/22 1412 10/25/22 1718   10/25/22 1400  meropenem (MERREM) 2 g in sodium chloride 0.9 % 100 mL IVPB  Status:  Discontinued        2 g 280 mL/hr over 30 Minutes Intravenous Every 12 hours 10/25/22 1304 10/26/22 1409   10/25/22 1200  doxycycline (VIBRA-TABS) tablet 100 mg  Status:  Discontinued        100 mg Oral Every 12 hours 10/25/22 1103 10/25/22 1109   10/24/22 0045  cefTRIAXone (ROCEPHIN) 2 g in sodium chloride 0.9 % 100 mL IVPB  Status:  Discontinued        2 g 200 mL/hr over 30 Minutes Intravenous Daily at bedtime 10/24/22 0025 10/25/22 1102   10/24/22 0045  metroNIDAZOLE (FLAGYL) IVPB 500 mg  Status:  Discontinued        500  mg 100 mL/hr over 60 Minutes Intravenous 2 times daily 10/24/22 0025 10/25/22 1102   10/24/22 0030  doxycycline (VIBRA-TABS) tablet 100 mg  Status:  Discontinued        100 mg Oral Every 12 hours 10/24/22 0025 10/25/22 1102   10/24/22 0000  piperacillin-tazobactam (ZOSYN) IVPB 3.375 g  Status:  Discontinued        3.375 g 100 mL/hr over 30 Minutes Intravenous  Once 10/23/22 2348 10/24/22 0025       Assessment/Plan: Left tubo-ovarian abscess s/p IR drain placement 3/26 RLQ fluid collection concerning for abscess s/p drain SBO vs ileus -Left tubo-ovarian abscess which was drained by IR 3/26. Culture w/ strep anginosis, GBS, prevotella bivia. She had a repeat CT scan 4/2 that showed a new 3.2 x 7.4 x 5.0 cm fluid collection in the RLQ. Appendix is not visualized but it would be odd for her  to have perforated appendicitis in addition to a left tubo-ovarian abscess.  - S/p IR drain of second fluid collection on 4/4. Cx NGTD. ID is following and managing her antibiotics. Output low (0 ml/24hr) -Gyn consulted for TOA and recc adding doxy and TVUS - CT scan also concerning for SBO. contrast in her colon, likely ileus. Tolerated CLD and +BM. Advance to FLD and can advance as tolerated. Continue tpn. Continue bowel regimen including prn suppository - defer further CT to IR - drains low output and may be able to eval soon   We will sign off but please do not hesitate to contact us with any questions or concerns or reconsult if needed.   FEN - FLD ADAT CM. TPN per primary. VTE - SCDs, hold eliquis. Okay for heparin gtt from our standpoint. Mobilize as able for bowel function. ID - unasyn, per ID Foley - none   I reviewed Consultant gynecology notes, hospitalist notes, last 24 h vitals and pain scores, last 48 h intake and output, last 24 h labs and trends, and last 24 h imaging results.   Eric Form, [facility] Surgery 11/07/2022, 8:05 AM Please see Amion for pager number during day hours 7:00am-4:30pm

## 2022-11-07 NOTE — Progress Notes (Signed)
PROGRESS NOTE    [name]  ZOX:[id number] DOB: 11-16-1970 DOA: 10/23/2022 PCP: Verlon Au, MD   Brief Narrative:  The patient is a 52 year old African-American female with past medical history of morbid obesity with BMI greater than 50, systolic/diastolic CHF with previous ejection fraction of 30 to 35% and grade 1 diastolic dysfunction, chronic right ventricular failure, history of DVT, hypothyroidism, depression and anxiety and chronic abdominal pain who presented to the emergency room on 3/24 with complaints of lightheadedness and abdominal pain.  She was also found to be somewhat confused.  Workup revealed severe sepsis secondary to intra-abdominal abscess concerning for PID/TOA and case discussed with gynecology.  Mass not present on CT on 3/15.  Interventional radiology consulted and patient underwent drain placement 3/26.  Mentation much improved.   Patient had been on a limited diet full liquids due to complaints of abdominal pain.  Started having some nausea and vomiting 3/29.  Abdominal x-ray noted dilated small bowel loops concerning for developing ileus.  Symptoms have persisted and repeat x-ray on 3/30 confirms SBO versus ileus.  Patient has had a prolonged period without eating much, and with persistent ileus, started on PICC line and TPN 4/2.    Repeat CT scan done and showed a newly identified intra-abdominal abscess and so interventional radiology was consulted and they feel that she is a candidate for a repeat drain placement and so she has been made n.p.o. at midnight and her blood thinners are being held.  Plan is for vein placement in the a.m. with Dr. Fredia Sorrow.  General surgery was also consulted given her ileus versus a small bowel obstruction so general surgery recommended NG tube placement for decompression of small bowel obstruction protocol with Gastrografin delayed film however patient was not able to tolerate NG to placement.  SBO protocol was done via oral  Gastrografin and delayed films still pending.  She underwent drain placement yesterday for her new found abscess.   **Continues to have some abdominal pain but WBC is trending down.  General surgery is going to be repeating her KUB given that she had contrast in the stomach still and also had some diffuse small bowel dilation and colonic distention and colon and recommending continuing n.p.o. for now.  Will continue to mobilize the patient.  Nutritionist consulted and recommending continue TPN and not elucidated relatively until patient clinically systolic tolerating full code diet.  ID recommending continuing Unasyn and transition to Augmentin when able to tolerate p.o.  They recommend 4 weeks of antibiotics from the last aspiration on 11/03/2022.   Assessment and Plan:  Severe sepsis secondary to intra-abdominal abscess in patient with acute on chronic abdominal pain. -Patient met criteria for severe sepsis given fever, leukocytosis,  -AKI creatinine greater than 2 and abdominal source.   -CT abdomen shows evidence of a 5 x 5 cm fluid collection in the left hemipelvis concerning for developing abscess.  GYN was consulted.  Dr. Rito Ehrlich had with Dr. Debroah Loop. Patient has not had sex in 4 years and the mass was not present on CT 3/15.   -GYN did not think that the patient is suffering from a tubo-ovarian abscess however General Surgery feels they should be seen for Tubo-ovarian abscess so we reached out Gynecology and they formally consulted. -Gynecology evaluated and recommending adding Doxycycline 100 mg IV every 12 for 10 to 14 days and changing to Augmentin XR 2 g twice daily for 10 to 14 days but see IDs recommendations as below -Gynecology recommending  obtaining a transvaginal ultrasound to better evaluate the pelvis -Patient underwent IR guided drain placement on 3/26.  -Following drain placement and antibiotic adjustment, white blood cell count and procalcitonin initially improved and started to  trend back upward.   -Now with minimal output.  Intervention radiology following.   -Repeat CT Scan done and showed "Interval percutaneous drainage of the loculated inflammatory appearing fluid collection within the left adnexa with complete evacuation of the previously noted loculated fluid. Extensive surrounding inflammatory change and poorly loculated fluid within the right adnexa persist. New loculated rim enhancing fluid collection within the right lower quadrant abutting several loops of a distal small bowel measuring 3.2 x 7.4 x 5.0 cm. Developing mid small bowel obstruction with a single point of transition identified within the mid abdomen. Minimal right nonobstructing nephrolithiasis. Moderate subcutaneous body wall edema." -WBC Trend:  Recent Labs  Lab 11/01/22 1146 11/02/22 1053 11/03/22 1700 11/04/22 0755 11/05/22 0405 11/06/22 1123 11/07/22 0455  WBC 22.3* 18.3* 15.4* 12.8* 14.5* 13.3* 14.7*  -Infectious Disease has been consulted and following recommending IV Unasyn and transitioning to p.o. Augmentin at the time of discharge on treating for 4 weeks since last drain placement -Given new abscess and a developing mid small bowel obstruction have consulted general surgery as well as interventional radiology and the plan is for another drain placement in her new abscess; drain was placed via CT-guided drainage of the right lower quadrant peritoneal fluid collection by Dr. Fredia SorrowYamagata and she now has a 10 [place] drain the fluid sent for culture analysis currently showing no growth to date at 2 Days with no Anaerobes isolated -Repeating CT Scan of the Abdomen and Pelvis tomorrow if drain output remains low   Syncope. Acute Metabolic encephalopathy/hypersomnolence-resolved -Initially presented with what appears to be a syncopal event. There was a concern for seizures, however EEG was unremarkable.   -Ammonia level, TSH, HIV, RPR all unremarkable.   -VBG negative for hypercarbia.    -Neurology evaluated and felt findings unrelated to any neurological issue.  Initial concern this could be secondary to pain medication.   -Patient's IV pain medicine was held and she became more appropriate and alert.   -Felt to be multifactorial secondary to decreased clearance from acute kidney injury, additional anesthesia and pain medication from intervention radiology for drain placement and IV pain medicine.   -By 3/28, patient much improved today despite getting continued pain medication is alert and oriented x 3 -Will need orthostatics prior to discharge and PT OT evaluation recommending SNF   ? UTI -Patient's white blood cell count initially improved following antibiotics and drain placement, down to 13 on 3/29.  -However, since then has been steadily rising, currently at 23.  Urine not very impressive.   -Negative cultures.  Discussed with infectious disease and they feel white count more likely related to SBO/ileus.   -See Above   Persistent ileus vs pSBO, improving slowly  -Persisting.   -Treating underlying infection and electrolyte abnormalities.  Ice chips.   -Will have PT see to increase ambulation.   -Have added TPN due to prolonged period without nutrition.  -Rechecking abdominal CT for follow-up on abscess and as above -Now she has a developing mid small bowel obstruction with a single point of transition identified within the mid abdomen. -General surgery has been consulted and agree with IR to percutaneously drain the second fluid collection and recommending NG tube placement for decompression and small bowel protocol with Gastrografin delayed film however patient refused NG tube  placement currently; she did not tolerate NG tube placement but small bowel protocol done orally and there were no follow-up on her delayed films.  If she has worsening abdominal pain nausea vomiting they are recommending reattempting NG tube placement and feel that this can hopefully improve with  conservative management -Repeat Abdominal X-Ray in the Am  -General surgery recommending optimizing electrolytes mobilization and keeping n.p.o sleep but now that she is doing better they recommended increasing from sips and chips to bring on a clear liquid diet today; CLD now being changed to FULL LIQUID DIET -Had a bowel movement and general surgery recommending continue bowel regimen including PRN suppositories -Drains are working well on both draining stool aspirate serosanguineous fluid.  The interventional radiology team likely can repeat a CT scan of the abdomen pelvis tomorrow with her output remains low for 1 more day -If tolerating diet okay with full liquid will go to soft in the morning and Dysphagia 3 diet per SLP recommendations  AKI, resolved. -Suspect ATN in the setting of sepsis, possibility of contrast-induced injury as well as prerenal etiology in the setting of poor p.o. intake.   -BUN/Cr Trend: Recent Labs  Lab 11/01/22 0532 11/02/22 1053 11/03/22 0241 11/04/22 1130 11/05/22 0405 11/06/22 0615 11/07/22 0455  BUN 7 <5* 5* 8 10 9 9   CREATININE 0.80 0.73 0.64 0.67 0.69 0.66 0.64  -Improved with IVF and now getting LR at 35 mL/hr as patient is on TPN as she is on TPN at 90 mL/hr -Continue to Monitor for S/Sx of Volume Overload  -Avoid Nephrotoxic Medications, Contrast Dyes, Hypotension and Dehydration to Ensure Adequate Renal Perfusion and will need to Renally Adjust Meds -Continue to Monitor and Trend Renal Function carefully and repeat CMP in the AM  -Monitor closely due to history of CHF.  Much improved by 3/28   Hypersomnolence. OHS. Obesity class III. -Complicates overall prognosis and care and patient at high risk for poor outome -Estimated body mass index is 55.24 kg/m as calculated from the following:   Height as of this encounter: 5\' 8"  (1.727 m).   Weight as of this encounter: 164.8 kg.  -Weight Loss and Dietary Counseling given  -Patient was recommended  to have an outpatient sleep evaluation during her recent hospitalization.   Chronic Combined Systolic and Diastolic CHF -Echocardiogram done during this hospitalization notes much improvement in ejection fraction and indeterminate diastolic function.   -BNP within normal limit.  Intake/Output Summary (Last 24 hours) at 11/07/2022 1637 Last data filed at 11/07/2022 1620 Gross per 24 hour  Intake 928.77 ml  Output 4000 ml  Net -3071.23 ml  -Continue to Monitor for S/Sx of Volume Overload -Currently appears euvolemic   Gastroparesis. -Patient is not on any medication for gastroparesis.   -Was Tolerating p.o. but still reporting some nausea and vomiting.  -Intolerant of Reglan.   -Abdominal x-ray notes multiple dilated air-filled loops and repeat CT scan as above.   -Repeat KUB done and showed "Persistent dilated loops of small bowel which may reflect an ileus or partial small bowel obstruction. New percutaneous drain in the right lower quadrant. No evidence of extravasated contrast material." -Will replace electrolytes accordingly. -Currently on Zofran. -Will resume home scopolamine patch.   Hypokalemia -Patient's K+ Level Trend: Recent Labs  Lab 11/01/22 0532 11/02/22 1053 11/03/22 0241 11/04/22 1130 11/05/22 0405 11/06/22 0615 11/07/22 0455  K 3.6 3.2* 3.4* 3.6 4.3 4.3 4.2  -Replete with TPN -Continue to Monitor and Replete as Necessary -Repeat CMP in  the AM    Hypomagnesemia -Patient's Mag Level Trend: Recent Labs  Lab 11/01/22 0532 11/02/22 1053 11/03/22 0241 11/04/22 1130 11/05/22 0405 11/06/22 0615 11/07/22 0455  MG 1.7 1.6* 2.0 1.7 1.8 1.7 1.8  -Continue to Monitor and Replete as Necessary with TPN -Repeat Mag in the AM    Severe Protein Calorie Malnutrition Nutrition Status: Nutrition Problem: Inadequate oral intake Etiology: poor appetite, nausea Signs/Symptoms: meal completion < 25%, NPO status, per patient/family report Interventions: Refer to RD note  for recommendations  History of PE. -Had a PE last October, told that she needs to be on Eliquis indefinitely.  Eliquis changed to sq Lovenox while npo but will repeat CT Scan and may resume oral AC if repeat CT Scan looks ok   Type 2 Diabetes Mellitus, uncontrolled with Hyperglycemia with Gastroparesis. -With long-term insulin use. -Patient is actually on U-500 insulin at home. -Now with SBO/ileus, decreased long-acting insulin down to 10 units only plus Q for her sliding scale. -Continue monitor CBGs per protocol -CBG Trend: Recent Labs  Lab 11/06/22 0040 11/06/22 0424 11/06/22 1459 11/06/22 2357 11/07/22 0328 11/07/22 0858 11/07/22 1124  GLUCAP 157* 168* 196* 198* 166* 220* 193*    Lupus and Sjogren's Syndrome. -Patient has on hydroxychloroquine, belimumab as well as Imuran which have all been held -Patient remains at risk for infections. -Continue Monitor for now.   Anxiety and Depression. -Continue home regimen and C/w Quetiapine 200 mg po qHS   Hypothyroidism. -Continue IV Levothyroxine 40 mcg and change back to Levothyroxine 50 mcg po when able to tolerate diet fully    Overactive bladder. -On Ditropan, currently held due but may be able to add soon because Diet is being advanced as tolerated    GERD. -Continue PPI with Pantoprazole 40 mg po Daily but was changed to IV -If Diet can be further advanced and tolerate po can changed back to po   Diabetic Neuropathy. -Has been on Lyrica. -Due to encephalopathy it was held.  Restart slowly   Hyperlipidemia. -Holding Crestor until off of TPN    Oral candidiasis. -Completed a week of Fluconazole.   Hypoalbuminemia -Patient's Albumin Trend: Recent Labs  Lab 10/27/22 0724 10/28/22 0728 11/02/22 1053 11/03/22 0241 11/04/22 1130 11/05/22 0405 11/07/22 0455  ALBUMIN 2.0* 1.7* 1.7* 1.7* 1.8* 1.9* 2.1*  -Continue to Monitor and Trend and repeat CMP in the AM   Obesity -Complicates overall prognosis and  care -Estimated body mass index is 55.24 kg/m as calculated from the following:   Height as of this encounter:  (1.727 m).   Weight as of this encounter: 164.8 kg.  -Weight Loss and Dietary Counseling given  DVT prophylaxis: Place and maintain sequential compression device Start: 10/25/22 1103    Code Status: Full Code Family Communication: No family currently at bedside  Disposition Plan:  Level of care: Telemetry Medical Status is: Inpatient Remains inpatient appropriate because: Needs further clinical improvement and clearance by specialist and will be getting a repeat CT scan of the abdomen pelvis early this upcoming week    Consultants:  Infectious Diseases General Surgery Interventional Radiology  Gynecology   Procedures:  As delineated  Antimicrobials:  Anti-infectives (From admission, onward)    Start     Dose/Rate Route Frequency Ordered Stop   11/07/22 1045  doxycycline (VIBRAMYCIN) 100 mg in sodium chloride 0.9 % 250 mL IVPB        100 mg 125 mL/hr over 120 Minutes Intravenous Every 12 hours 11/07/22 0957  10/30/22 2000  Ampicillin-Sulbactam (UNASYN) 3 g in sodium chloride 0.9 % 100 mL IVPB        3 g 200 mL/hr over 30 Minutes Intravenous Every 6 hours 10/30/22 1530     10/26/22 2200  meropenem (MERREM) 2 g in sodium chloride 0.9 % 100 mL IVPB  Status:  Discontinued        2 g 280 mL/hr over 30 Minutes Intravenous Every 8 hours 10/26/22 1409 10/30/22 1530   10/26/22 1000  fluconazole (DIFLUCAN) tablet 100 mg       See Hyperspace for full Linked Orders Report.   100 mg Oral Daily 10/25/22 1412 10/31/22 1011   10/25/22 2000  doxycycline (VIBRAMYCIN) 100 mg in sodium chloride 0.9 % 250 mL IVPB  Status:  Discontinued        100 mg 125 mL/hr over 120 Minutes Intravenous Every 12 hours 10/25/22 1304 10/30/22 1530   10/25/22 1500  fluconazole (DIFLUCAN) tablet 200 mg       See Hyperspace for full Linked Orders Report.   200 mg Oral Daily 10/25/22 1412  10/25/22 1718   10/25/22 1400  meropenem (MERREM) 2 g in sodium chloride 0.9 % 100 mL IVPB  Status:  Discontinued        2 g 280 mL/hr over 30 Minutes Intravenous Every 12 hours 10/25/22 1304 10/26/22 1409   10/25/22 1200  doxycycline (VIBRA-TABS) tablet 100 mg  Status:  Discontinued        100 mg Oral Every 12 hours 10/25/22 1103 10/25/22 1109   10/24/22 0045  cefTRIAXone (ROCEPHIN) 2 g in sodium chloride 0.9 % 100 mL IVPB  Status:  Discontinued        2 g 200 mL/hr over 30 Minutes Intravenous Daily at bedtime 10/24/22 0025 10/25/22 1102   10/24/22 0045  metroNIDAZOLE (FLAGYL) IVPB 500 mg  Status:  Discontinued        500 mg 100 mL/hr over 60 Minutes Intravenous 2 times daily 10/24/22 0025 10/25/22 1102   10/24/22 0030  doxycycline (VIBRA-TABS) tablet 100 mg  Status:  Discontinued        100 mg Oral Every 12 hours 10/24/22 0025 10/25/22 1102   10/24/22 0000  piperacillin-tazobactam (ZOSYN) IVPB 3.375 g  Status:  Discontinued        3.375 g 100 mL/hr over 30 Minutes Intravenous  Once 10/23/22 2348 10/24/22 0025       Subjective: Seen and examined at bedside and thinks she is doing a little bit better.  Having abdominal fine.  States her nausea is improved.  Continues to have some mild abdominal soreness.  States that Gynecology came by yesterday.  Denies any other concerns or complaints at this time.  Objective: Vitals:   11/07/22 0323 11/07/22 0910 11/07/22 1128 11/07/22 1545  BP: 139/73 (!) 160/78 (!) 171/72 116/72  Pulse: 81 85 88 76  Resp: 16 18 18 15   Temp: 98.3 F (36.8 C) 99.1 F (37.3 C) 98.2 F (36.8 C) 98 F (36.7 C)  TempSrc: Oral Oral  Oral  SpO2: 99% 99%    Weight:      Height:        Intake/Output Summary (Last 24 hours) at 11/07/2022 1623 Last data filed at 11/07/2022 1620 Gross per 24 hour  Intake 928.77 ml  Output 4000 ml  Net -3071.23 ml   Filed Weights   11/01/22 0600 11/02/22 0500 11/03/22 0500  Weight: (!) 165.4 kg (!) 164 kg (!) 164.8 kg    Examination:  Physical Exam:  Constitutional: WN/WD super morbidly obese African-American female in no acute distress appears calm Respiratory: Diminished to auscultation bilaterally, no wheezing, rales, rhonchi or crackles. Normal respiratory effort and patient is not tachypenic. No accessory muscle use.  Unlabored breathing Cardiovascular: RRR, no murmurs / rubs / gallops. S1 and S2 auscultated. No extremity edema.  Abdomen: Soft, mildly-tender, distended secondary to body habitus and has 2 drains in the abdomen in place on the left and 1 on the right.. Bowel sounds positive.  GU: Deferred. Musculoskeletal: No clubbing / cyanosis of digits/nails. No joint deformity upper and lower extremities. Skin: No rashes, lesions, ulcers on the skin evaluation. No induration; Warm and dry.  Neurologic: CN 2-12 grossly intact with no focal deficits.  Romberg sign and cerebellar reflexes not assessed.  Psychiatric: Normal judgment and insight. Alert and oriented x 3. Normal mood and appropriate affect.   Data Reviewed: I have personally reviewed following labs and imaging studies  CBC: Recent Labs  Lab 11/03/22 1700 11/04/22 0755 11/05/22 0405 11/06/22 1123 11/07/22 0455  WBC 15.4* 12.8* 14.5* 13.3* 14.7*  NEUTROABS 10.5* 8.5* 9.0* 8.6* 8.7*  HGB 7.4* 7.2* 7.4* 7.6* 8.9*  HCT 22.8* 22.2* 23.3* 23.0* 27.0*  MCV 89.1 94.9 87.6 87.1 87.1  PLT 480* 437* 457* 439* 489*   Basic Metabolic Panel: Recent Labs  Lab 11/03/22 0241 11/04/22 1130 11/05/22 0405 11/06/22 0615 11/07/22 0455  NA 134* 140 137 134* 134*  K 3.4* 3.6 4.3 4.3 4.2  CL 107 105 104 102 103  CO2 23 24 24 24 25   GLUCOSE 133* 176* 177* 175* 192*  BUN 5* 8 10 9 9   CREATININE 0.64 0.67 0.69 0.66 0.64  CALCIUM 7.9* 8.3* 8.5* 8.4* 8.5*  MG 2.0 1.7 1.8 1.7 1.8  PHOS 3.0 3.3 3.7 4.1 4.4   GFR: Estimated Creatinine Clearance: 135.4 mL/min (by C-G formula based on SCr of 0.64 mg/dL). Liver Function Tests: Recent Labs  Lab  11/02/22 1053 11/03/22 0241 11/04/22 1130 11/05/22 0405 11/07/22 0455  AST 12* 12* 14* 18 16  ALT 14 10 11 12 13   ALKPHOS 111 108 102 98 105  BILITOT 0.8 0.6 0.2* 0.3 0.6  PROT 5.8* 5.5* 5.6* 5.8* 6.1*  ALBUMIN 1.7* 1.7* 1.8* 1.9* 2.1*   No results for input(s): "LIPASE", "AMYLASE" in the last 168 hours. No results for input(s): "AMMONIA" in the last 168 hours. Coagulation Profile: No results for input(s): "INR", "PROTIME" in the last 168 hours. Cardiac Enzymes: No results for input(s): "CKTOTAL", "CKMB", "CKMBINDEX", "TROPONINI" in the last 168 hours. BNP (last 3 results) No results for input(s): "PROBNP" in the last 8760 hours. HbA1C: No results for input(s): "HGBA1C" in the last 72 hours. CBG: Recent Labs  Lab 11/06/22 1459 11/06/22 2357 11/07/22 0328 11/07/22 0858 11/07/22 1124  GLUCAP 196* 198* 166* 220* 193*   Lipid Profile: Recent Labs    11/07/22 0455  TRIG 71   Thyroid Function Tests: No results for input(s): "TSH", "T4TOTAL", "FREET4", "T3FREE", "THYROIDAB" in the last 72 hours. Anemia Panel: No results for input(s): "VITAMINB12", "FOLATE", "FERRITIN", "TIBC", "IRON", "RETICCTPCT" in the last 72 hours. Sepsis Labs: Recent Labs  Lab 11/01/22 0532 11/02/22 0500  PROCALCITON 0.32 0.27    Recent Results (from the past 240 hour(s))  Urine Culture     Status: Abnormal   Collection Time: 10/30/22  3:00 AM   Specimen: Urine, Random  Result Value Ref Range Status   Specimen Description URINE, RANDOM  Final   Special Requests  NONE Reflexed from [id number]  Final   Culture (A)  Final    <10,000 COLONIES/mL INSIGNIFICANT GROWTH Performed at [facility], [address] [place], [place] [address]    Report Status 10/31/2022 FINAL  Final  Aerobic/Anaerobic Culture w Gram Stain (surgical/deep wound)     Status: None (Preliminary result)   Collection Time: 11/03/22  1:08 PM   Specimen: Abscess  Result Value Ref Range Status   Specimen Description  ABSCESS  Final   Special Requests Normal  Final   Gram Stain   Final    ABUNDANT WBC PRESENT, PREDOMINANTLY PMN NO ORGANISMS SEEN    Culture   Final    NO GROWTH 4 DAYS NO ANAEROBES ISOLATED; CULTURE IN PROGRESS FOR 5 DAYS Performed at [facility], [address], [place], [place] [address]    Report Status PENDING  Incomplete    Radiology Studies: No results found. Scheduled Meds:  carvedilol  3.125 mg Oral BID WC   Chlorhexidine Gluconate Cloth  6 each Topical Daily   enoxaparin (LOVENOX) injection  120 mg Subcutaneous Q12H   insulin aspart  0-20 Units Subcutaneous Q4H   insulin glargine-yfgn  10 Units Subcutaneous Daily   [START ON 11/11/2022] levothyroxine  40 mcg Intravenous Daily   pantoprazole (PROTONIX) IV  40 mg Intravenous Q24H   polyethylene glycol  17 g Oral Daily   QUEtiapine  200 mg Oral QHS   senna-docusate  1 tablet Oral QHS   sodium chloride flush  3 mL Intravenous Q12H   sodium chloride flush  5 mL Intracatheter Q8H   sodium chloride flush  5 mL Intracatheter Q8H   Continuous Infusions:  ampicillin-sulbactam (UNASYN) IV 3 g (11/07/22 1456)   doxycycline (VIBRAMYCIN) IV 100 mg (11/07/22 1223)   TPN ADULT (ION) 90 mL/hr at 11/07/22 0358   TPN ADULT (ION)      LOS: 14 days   Marguerita Merles, DO [facility] Available via Epic secure chat 7am-7pm After these hours, please refer to coverage provider listed on [email or web address] 11/07/2022, 4:23 PM

## 2022-11-07 NOTE — Progress Notes (Addendum)
PHARMACY - TOTAL PARENTERAL NUTRITION CONSULT NOTE   Indication: Small bowel obstruction and prolonged inability to tolerate PO intake  Patient Measurements: Height: 5\' 8"  (172.7 cm) Weight: (!) 164.8 kg (363 lb 5.1 oz) IBW/kg (Calculated) : 63.9 TPN AdjBW (KG): 86.4 Body mass index is 55.24 kg/m.  Assessment:  52 yo female with PMH of Bell's palsy, DM, CHF, hypothyroidism, hx DVT on Eliquis. Recently discharged on 3/21 after admission for abdominal pain and intractable nausea/vomiting. Brought back to ED on 3/24 for AMS and loss of consciousness. Endorsed abdominal pain and nausea/vomiting on day of admission. Pharmacy has been consulted on 4/3 to begin TPN as patient now with evidence of SBO on imaging and prolonged poor PO intake.   Discussed with patient on 4/3, she states that she really has not been able to tolerate a meal since before her previous admission ~3/16. Has a few juice cups and water cup on her tray, but states she is really only able to drink liquids when she is taking meds. Intake causes her to experience nausea and abdominal pain. Patient stated that just thinking about food/liquid/PO intake during our discussion made her feel nauseous.   Patient now with BM overnight, diet increased to full liquids.   Glucose / Insulin: hx T2DM, last A1c 7.4% 05/2022, on U-500 100 units TID, metformin, Jardiance, and Ozempic PTA >> was started on Semglee 100 units BID here but reduced down to Semglee 10 units QD on 4/1 for low BG's secondary to poor PO intake. Also on sSSI.  - CBGs up to 220 - 10 units of SSI/24h Electrolytes: Na 134, Mg 1.8 (increased in TPN on 4/7 + 4/6), Phos: 4.4, K: 4.2,  all others WNL and stable - Goal K >/= 4 and Mag >/= 2 Renal: Scr <1, BUN 9 Hepatic: 4/8: LFTs wnl, Tgs 71 Intake / Output: UOP 55mL/kg/hr, +15L this admission, LBM 4/7, drain output 2.5 mL MIVF: none GI Imaging: - 3/24 CTAP: inflammatory/infectious process such as PID w/ developing abscess, no  bowel obstruction - 3/29 Abd X-ray: multiple dilated air-filled loops of small and large bowel, could represent ileus or developing obstruction - 4/2 CTAP: new loculated rim enhancing fluid collection w/in the RLQ, several loops of distal small bowel, developing mid small bowel obstruction - 4/5 Abd x-ray: persistent dilated loops of small bowel which may reflect ileus or pSBO - 4/6 Abd Xray: no significant change in small bowel dilation GI Surgeries / Procedures:  - 3/26: CT-guided left pelvic abscess drain placement  Central access: 4/3 TPN start date: 4/3  Nutritional Goals: Goal TPN rate is 90 mL/hr (provides 112 g of protein and 1988 kcals per day)  RD Assessment: Estimated Needs Total Energy Estimated Needs: 1900-2100 kcal/d Total Protein Estimated Needs: 100-120 g/d Total Fluid Estimated Needs: 2L/d  Current Nutrition:  TPN Full liquids started 4/8    Plan:  Continue TPN at 59mL/hr at 1800 providing 112g AA and 1988 kcal meeting 100% of nutritional needs.  - Continue to monitor volume status, can consider concentrating TPN if appears volume overloaded  Electrolytes in TPN: Na to 22mEq/L, K 23mEq/L, Ca 61mEq/L, continue Mg 8 mEq/L to keep >/= 2 (has been increased daily since 4/6), and  decrease Phos 9mmol/L (borderline high phos today). Cl:Ac 1:1 Will give 2g magnesium x 1.  Add standard MVI and trace elements to TPN Increase to resistant q4h SSI and adjust as needed, patient resistant at baseline, utilizing U-500 PTA, increase in BS likely due to increase  in PO intake.  Continue Semglee 10 units SQ QD for now - will hold off adding insulin to TPN for now given hx of hypoglycemia, potential for weaning in the next day.  Remove thiamine as has been 7 days of repletion.  Monitor TPN labs on Mon/Thurs and PRN, will recheck mag in the AM.  F/u ability to advance diet - trying liquids today  Estill Batten, PharmD, BCCCP  11/07/2022 7:36 AM

## 2022-11-07 NOTE — Progress Notes (Signed)
Subjective: Pt just returned from [facility], states pain is controlled abdomen is sore.  +BM, + flatus  Objective: I have reviewed patient's vital signs, intake and output, medications, and labs. Gen sitting in bed CV RRR Lungs CTAB Abd soft, + BS, app tender Ext sym, NT  Assessment/Plan: 52yo with MMP, intraabdominal abscess - IR has placed drains on Left and Right L with drainage, R with minimal TVUS being obtained now Difficult to imagine TOA without being sexually active for > 2 years - if want to cover completely, add Doxycycline to abx  Will stop by tomorrow.  Will review [place]   LOS: 14 days    Sherian Rein, MD 11/07/2022, 5:01 PM

## 2022-11-07 NOTE — Progress Notes (Signed)
Referring Physician(s): Marguerita MerlesSheikh, Omair   Supervising Physician: Marliss CootsSuttle, Dylan  [facility]  Chief Complaint:  Left TOA s/p drain placement by Dr. Deanne CofferHassell on 3/26  RLQ intraabdominal fluid collection s/p drain placement by Dr. Fredia SorrowYamagata on 11/03/22   Subjective:  Patient sitting in bed, NAD, has no complaints today.  Wondering when the drains can be removed.  Informed the patient that will obtain f/u CT soon.   Allergies: Metoclopramide, Codeine, Hydrocodone, and Percocet [oxycodone-acetaminophen]  Medications: Prior to Admission medications   Medication Sig Start Date End Date Taking? Authorizing Provider  ADVAIR HFA 230-21 MCG/ACT inhaler Inhale 2 puffs into the lungs 2 (two) times daily. 12/20/20   [provider]  albuterol (PROVENTIL) (2.5 MG/3ML) 0.083% nebulizer solution Take 2.5 mg by nebulization as needed for wheezing.    [provider]  ALPRAZolam Prudy Feeler(XANAX) 0.5 MG tablet Take 0.5 mg by mouth as needed for anxiety or sleep. 04/19/17   [provider]  apixaban (ELIQUIS) 2.5 MG TABS tablet Take 1 tablet (2.5 mg total) by mouth 2 (two) times daily. 09/21/22   Yates DecampGanji, Jay, MD  ARIPiprazole (ABILIFY) 5 MG tablet  01/14/21   [provider]  Azathioprine 75 MG TABS Take 150 mg by mouth 2 (two) times daily. 02/04/21   [provider]  B-D UF III MINI PEN NEEDLES 31G X 5 MM MISC USE AS DIRECTED TWICE A DAY 10/31/18   Romero BellingEllison, Sean, MD  Belimumab (BENLYSTA) 200 MG/ML SOAJ Inject 200 mg into the skin once a week. Thursdays 12/19/19   [provider]  buPROPion (WELLBUTRIN XL) 300 MG 24 hr tablet Take 300 mg by mouth daily. 03/14/22   [provider]  carvedilol (COREG) 25 MG tablet Take 1.5 tablets (37.5 mg total) by mouth 2 (two) times daily with a meal. 01/01/21   Yates DecampGanji, Jay, MD  Continuous Blood Gluc Sensor (FREESTYLE LIBRE 3 SENSOR) MISC 1 Device by Does not apply route every 14 (fourteen) days. Apply 1 sensor on  upper arm every 14 days for continuous glucose monitoring 05/25/22   Reather LittlerKumar, Ajay, MD  cromolyn (OPTICROM) 4 % ophthalmic solution Place 1 drop into both eyes 2 (two) times daily. 01/14/21   [provider]  cycloSPORINE (RESTASIS) 0.05 % ophthalmic emulsion Place 1 drop into both eyes in the morning, at noon, and at bedtime.     [provider]  diclofenac Sodium (VOLTAREN) 1 % GEL APPLY 2 GRAMS TO AFFECTED AREA 4 TIMES A DAY Patient taking differently: Apply 2 g topically 4 (four) times daily as needed (pain). 01/11/22   Cantwell, Celeste C, PA-C  EASY TOUCH PEN NEEDLES 32G X 4 MM MISC USE TO INJECT INSULIN TWICE DAILY 08/07/19   Romero BellingEllison, Sean, MD  famotidine (PEPCID) 20 MG tablet Take 20 mg by mouth 2 (two) times daily.    [provider]  fluticasone (FLONASE) 50 MCG/ACT nasal spray Place 2 sprays into both nostrils continuous as needed for allergies or rhinitis.    [provider]  glucose blood (ACCU-CHEK GUIDE) test strip 1 each by Other route 2 (two) times daily. And lancets 2/day 06/18/21   Romero BellingEllison, Sean, MD  glucose blood [facility](ONETOUCH ULTRA) test strip Test blood sugar 4 times a day 03/30/22   Reather LittlerKumar, Ajay, MD  hydroxychloroquine (PLAQUENIL) 200 MG tablet Take 200 mg by mouth 2 (two) times daily.    [provider]  ibuprofen (ADVIL) 600 MG tablet Take 1 tablet (600 mg total)  by mouth every 6 (six) hours as needed. Patient taking differently: Take 600 mg by mouth every 6 (six) hours as needed for mild pain. 10/14/22   Roxy Horseman, PA-C  insulin regular human CONCENTRATED (HUMULIN R U-500 KWIKPEN) 500 UNIT/ML KwikPen 100 UNITS, 30 MINUTES BEFORE EACH MEAL Patient taking differently: 100 Units 3 (three) times daily with meals. 100 Units, 30 minutes before each meal 08/16/22   Reather Littler, MD  Insulin Syringe-Needle U-100 (EASY TOUCH INSULIN SYRINGE) 31G X 5/16" 1 ML MISC USE AS DIRECTED THREE TIMES A DAY 10/12/18   Romero Belling, MD  JARDIANCE 10 MG TABS  tablet TAKE 1 TABLET (10 MG TOTAL) BY MOUTH DAILY WITH BREAKFAST. 08/18/22   Reather Littler, MD  Lancets [facility] ULTRASOFT) lancets Test blood sugar 4 times a day 03/30/22   Reather Littler, MD  levothyroxine (SYNTHROID) 50 MCG tablet Take 1 tablet (50 mcg total) by mouth daily at 6 (six) AM. 08/30/19   Marguerita Merles Latif, DO  megestrol (MEGACE) 40 MG tablet Take 40 mg by mouth daily. 01/30/15   [provider]  metFORMIN (GLUCOPHAGE-XR) 500 MG 24 hr tablet Take 2 tablets (1,000 mg total) by mouth 2 (two) times daily. 02/02/21   Romero Belling, MD  Multiple Vitamin (MULTIVITAMIN WITH MINERALS) TABS tablet Take 1 tablet by mouth daily. 08/30/19   Marguerita Merles Latif, DO  ondansetron (ZOFRAN-ODT) 4 MG disintegrating tablet Take 1 tablet (4 mg total) by mouth every 8 (eight) hours as needed for nausea or vomiting. 10/14/22   Roxy Horseman, PA-C  oxybutynin (DITROPAN-XL) 5 MG 24 hr tablet Take 5 mg by mouth daily. 04/25/22   [provider]  pregabalin (LYRICA) 75 MG capsule Take 75 mg 3 (three) times daily by mouth.  01/28/15   [provider]  prochlorperazine (COMPAZINE) 10 MG tablet Take 1 tablet (10 mg total) by mouth every 6 (six) hours as needed for nausea or vomiting. 05/14/22   Osvaldo Shipper, MD  promethazine (PHENERGAN) 25 MG suppository Place 1 suppository (25 mg total) rectally every 6 (six) hours as needed for nausea or vomiting. 10/21/22   Chestine Spore, Meghan R, PA-C  QUEtiapine (SEROQUEL) 300 MG tablet Take 300 mg by mouth at bedtime. 01/08/21   [provider]  rosuvastatin (CRESTOR) 20 MG tablet Take 1 tablet (20 mg total) by mouth daily. 06/09/21 10/16/22  Cantwell, Celeste C, PA-C  Semaglutide, 2 MG/DOSE, 8 MG/3ML SOPN Inject 2 mg as directed once a week. 08/18/22   Reather Littler, MD  sucralfate (CARAFATE) 1 GM/10ML suspension Take 10 mLs by mouth 2 (two) times daily as needed (abdomen pain). 12/09/20   [provider]  topiramate (TOPAMAX) 25 MG tablet Take 75 mg by  mouth at bedtime. 03/17/16   [provider]  torsemide (DEMADEX) 20 MG tablet Take 1 tablet (20 mg total) by mouth at bedtime. 06/09/21   Cantwell, Celeste C, PA-C  TRANSDERM-SCOP 1 MG/3DAYS Place 1 patch (1.5 mg total) onto the skin every 3 (three) days. As needed for nausea 10/21/22   Melton Alar R, PA-C  Vitamin D, Ergocalciferol, (DRISDOL) 1.25 MG (50000 UNIT) CAPS capsule Take 50,000 Units by mouth once a week. 09/20/22   [provider]     Vital Signs: BP (!) 160/78 (BP Location: Left Arm)   Pulse 85   Temp 99.1 F (37.3 C) (Oral)   Resp 18   Ht 5\' 8"  (1.727 m)   Wt (!) 363 lb 5.1 oz (164.8 kg)   LMP 03/24/2017  SpO2 99%   BMI 55.24 kg/m   Physical Exam Vitals reviewed.  Constitutional:      General: She is not in acute distress.    Appearance: She is not ill-appearing.  HENT:     Head: Normocephalic.  Pulmonary:     Effort: Pulmonary effort is normal.  Abdominal:     General: Abdomen is flat. Bowel sounds are normal.     Comments: Positive RLQ drain to a suction bulb. Site is unremarkable with no erythema, edema, tenderness, bleeding or drainage. Suture and stat lock in place. Dressing is clean, dry, and intact. ~5 ml of  clear fluid noted in the bulb. Drain aspirates and flushes well, aspirated serosanguinous fluid with some debris .  Positive LLQ drain to a gravity bag. Site is unremarkable with no erythema, edema, tenderness, bleeding or drainage. Suture and stat lock in place. Dressing is clean, dry, and intact. Trace of  pink, purulent fluid noted in the bag. Drain aspirates and flushes well, flushes serosanguinous fluid with debris.      Skin:    General: Skin is warm and dry.  Neurological:     Mental Status: She is alert and oriented to person, place, and time.  Psychiatric:        Mood and Affect: Mood normal.        Behavior: Behavior normal.     Imaging: DG Abd Portable 1V  Result Date: 11/05/2022 CLINICAL DATA:  [id number].   Small-bowel obstruction. EXAM: PORTABLE ABDOMEN - 1 VIEW COMPARISON:  11/04/2022 at 9:41 a.m. FINDINGS: 5:14 a.m. there are bilateral pigtail drainage catheters in the lower abdominopelvic quadrants. Contrast has partially eliminated from the dilated small bowel with contrast in the ascending colon faintly visible. Small bowel dilatation continues to measure up to 5 cm and is not significantly changed. Colonic gas is still seen through into the rectum with moderate fecal retention in the ascending and transverse colon. There is no supine evidence of free air.  Lung bases are clear. No pathologic calcifications or other significant findings are seen. IMPRESSION: 1. No significant change in small-bowel dilatation. Contrast has partially eliminated from the dilated small bowel with contrast in the ascending colon faintly visible. 2. Moderate fecal retention in the ascending and transverse colon. Electronically Signed   By: Almira Bar M.D.   On: 11/05/2022 07:45   DG Abd Portable 1V  Result Date: 11/04/2022 CLINICAL DATA:  Small-bowel obstruction. EXAM: PORTABLE ABDOMEN - 1 VIEW COMPARISON:  Radiographs 11/03/2022 and 11/01/2022.  CT 11/01/2022. FINDINGS: 0938 hours. Four supine views of the abdomen are submitted. There is a new percutaneous drain in the right lower quadrant. Existing percutaneous drain in the left lower quadrant is not significantly changed. Multiple moderately dilated loops of small bowel are again noted. There is some contrast material in the stomach and in the right lower quadrant, the latter probably within the colon. No extravasated contrast material identified. There is some gas in the colon. IMPRESSION: Persistent dilated loops of small bowel which may reflect an ileus or partial small bowel obstruction. New percutaneous drain in the right lower quadrant. No evidence of extravasated contrast material. Electronically Signed   By: Carey Bullocks M.D.   On: 11/04/2022 10:26   CT GUIDED  PERITONEAL/RETROPERITONEAL FLUID DRAIN BY PERC CATH  Result Date: 11/03/2022 CLINICAL DATA:  Status post previous percutaneous catheter drainage of left-sided pelvic abscess on 10/25/2022. New intra-abdominal fluid collection in the right lower quadrant now requiring second percutaneous catheter drainage procedure. EXAM:  CT GUIDED CATHETER DRAINAGE OF PERITONEAL ABSCESS ANESTHESIA/SEDATION: Moderate (conscious) sedation was employed during this procedure. A total of Versed 2.0 mg and Fentanyl 100 mcg was administered intravenously. Moderate Sedation Time: 26 minutes. The patient's level of consciousness and vital signs were monitored continuously by radiology nursing throughout the procedure under my direct supervision. PROCEDURE: The procedure, risks, benefits, and alternatives were explained to the patient. Questions regarding the procedure were encouraged and answered. The patient understands and consents to the procedure. A time out was performed prior to initiating the procedure. The right lower abdominal wall was prepped with chlorhexidine in a sterile fashion, and a sterile drape was applied covering the operative field. A sterile gown and sterile gloves were used for the procedure. Local anesthesia was provided with 1% Lidocaine. CT was performed in a supine position through the mid to lower abdomen and pelvis. After choosing a site for skin entry an 18 gauge trocar needle was advanced under CT guidance to the level of a right lower quadrant anterior peritoneal abscess. After confirming needle tip position, fluid was aspirated. A guidewire was advanced into the fluid collection and the needle removed. The tract was dilated over the wire and a 10 French percutaneous drainage catheter placed. Catheter position was confirmed by CT. The drainage catheter was further aspirated and a fluid sample sent for culture analysis. The drain was flushed and connected to a suction bulb. It was secured at the skin with a  Prolene retention suture and adhesive retention device. RADIATION DOSE REDUCTION: This exam was performed according to the departmental dose-optimization program which includes automated exposure control, adjustment of the mA and/or kV according to patient size and/or use of iterative reconstruction technique. COMPLICATIONS: None FINDINGS: Fluid collection in the right lower quadrant again localizes lateral to bowel and measures roughly 4 cm in maximum diameter. The fluid collection in the left pelvis is completely decompressed by the previously placed percutaneous drainage catheter. After advancing a trocar needle into the collection, there was return of turbid, yellow fluid. After drainage catheter placement, a larger fluid sample was withdrawn and sent for culture analysis. After drain placement, there is good return fluid. IMPRESSION: CT-guided percutaneous catheter drainage of right lower quadrant peritoneal abscess. A 10 French drainage catheter was placed and attached to suction bulb drainage. A sample of turbid yellow fluid was sent for culture analysis. Initial CT shows complete decompression of the left pelvic fluid collection after prior catheter drainage. Electronically Signed   By: Irish Lack M.D.   On: 11/03/2022 14:30    Labs:  CBC: Recent Labs    11/04/22 0755 11/05/22 0405 11/06/22 1123 11/07/22 0455  WBC 12.8* 14.5* 13.3* 14.7*  HGB 7.2* 7.4* 7.6* 8.9*  HCT 22.2* 23.3* 23.0* 27.0*  PLT 437* 457* 439* 489*    COAGS: Recent Labs    10/23/22 2110  INR 1.2  APTT 26    BMP: Recent Labs    11/04/22 1130 11/05/22 0405 11/06/22 0615 11/07/22 0455  NA 140 137 134* 134*  K 3.6 4.3 4.3 4.2  CL 105 104 102 103  CO2 GLUCOSE 176* 177* 175* 192*  BUN CALCIUM 8.3* 8.5* 8.4* 8.5*  CREATININE 0.67 0.69 0.66 0.64  GFRNONAA >60 >60 >60 >60    LIVER FUNCTION TESTS: Recent Labs    11/03/22 0241 11/04/22 1130 11/05/22 0405 11/07/22 0455   BILITOT 0.6 0.2* 0.3 0.6  AST 12* 14* 18 16  ALT 10  11 12 13   ALKPHOS 108 102 98 105  PROT 5.5* 5.6* 5.8* 6.1*  ALBUMIN 1.7* 1.8* 1.9* 2.1*    Assessment and Plan:  52 y.o. female with hx SLE in immunodepresses status who developed L TOA s/p drain placement by Dr. Deanne Coffer on 10/25/22, f/u CT on 11/01/22 showed RLQ fluid collection s/p drain placement by Dr. Fredia Sorrow on 11/03/22.   Afebrile  WBC 14.7 << 13.3 <<14.5  Hgb 8.9<<7.6  Cx from LLQ strep anginosis; RLQ no growth x 4 days  Poss SBP on CT 4/2, CCS was following and patient tolerating CLD and has BM . CCS signed off today  Output LLQ 2.5 -  serosanguinous but mildly cloudy with debris ; RLQ 0 - clear fluid in the bulb, aspirates serosanguinous fluid with debris  Drain #1 Location: LLQ Size: Fr size: 12 Fr Date of placement: 3/26  Currently to: Drain collection device: gravity  Drain #12 Location: RLQ Size: Fr size: 10 Fr Date of placement: 11/03/22  Currently to: Drain collection device: suction bulb 24 hour output:  Output by Drain (mL) 11/05/22 0701 - 11/05/22 1900 11/05/22 1901 - 11/06/22 0700 11/06/22 0701 - 11/06/22 1900 11/06/22 1901 - 11/07/22 0700 11/07/22 0701 - 11/07/22 1202  Closed System Drain 1 Left LLQ Other (Comment) 12 Fr. 7.5 5 2.5 0   Closed System Drain 2 RUQ Bulb (JP) 10 Fr. 1 0 0 0     Interval imaging/drain manipulation:  3/26: LLQ drain placement  4/2: CT AP with showed  complete evacuation of the previously noted loculated fluid;  New loculated rim enhancing fluid collection within the right lower quadrant; Developing mid small bowel obstruction  4/4: RLQ drain placement   Current examination: Flushes/aspirates easily.  Insertion site unremarkable. Suture and stat lock in place. Dressed appropriately.   Plan: Both drain still aspirate serosanguinous fluid, WBC fluctuating.  Will obtain CT AP with tomorrow if output remains low one more day.  ID/CCS signed off.   Continue TID flushes with 5  cc NS. Record output Q shift. Dressing changes QD or PRN if soiled.  Call IR APP or on call IR MD if difficulty flushing or sudden change in drain output.  Repeat imaging/possible drain injection once output < 10 mL/QD (excluding flush material). Consideration for drain removal if output is < 10 mL/QD (excluding flush material), pending discussion with the providing surgical service.  Discharge planning: Please contact IR APP or on call IR MD prior to patient d/c to ensure appropriate follow up plans are in place. Typically patient will follow up with IR clinic 10-14 days post d/c for repeat imaging/possible drain injection. IR scheduler will contact patient with date/time of appointment. Patient will need to flush drain QD with 5 cc NS, record output QD, dressing changes every 2-3 days or earlier if soiled.   IR will continue to follow - please call with questions or concerns.   Further treatment plan per primary team.  Appreciate and agree with the plan.  IR to follow.    Electronically Signed: Willette Brace, PA-C 11/07/2022, 11:54 AM   I spent a total of 15 Minutes at the the patient's bedside AND on the patient's hospital floor or unit, greater than 50% of which was counseling/coordinating care for RLQ and LLQ drain follow up.   This chart was dictated using voice recognition software.  Despite best efforts to proofread,  errors can occur which can change the documentation meaning.

## 2022-11-07 NOTE — Progress Notes (Signed)
Mobility Specialist Progress Note   11/07/22 1220  Mobility  Activity Dangled on edge of bed  Level of Assistance Minimal assist, patient does 75% or more  Assistive Device Other (Comment) (HHA)  Range of Motion/Exercises All extremities  Activity Response Tolerated well  Mobility Referral Yes  $Mobility charge 1 Mobility   Pre Mobility: 89 HR, 100% SpO2 During Mobility: 102 HR, 94% SpO2 Post Mobility: 94 HR, 98% SpO2  Received in bed having a little c/o abdominal pain and agreeable. Pt deferring OOB mobility d/t fear of incontinence but agreeable to dangling EOB. MinA w/ UE support to get to EOB, no complaints or faults during transition. Left w/ call bell in reach and needs met.  Frederico Hamman Mobility Specialist Please contact via SecureChat or  Rehab office at [phone]

## 2022-11-07 NOTE — Progress Notes (Addendum)
Physical Therapy Treatment Patient Details Name: [name] MRN: [id number] DOB: 04-14-1971 Today's Date: 11/07/2022   History of Present Illness Pt is a 51 y.o. F who presents 10/23/2022 with acute metabolic encephalopathy and severe sepsis secondary to intra-abdominal abscess. S/p drain placement 3/26. 3/30 x-ray showing SBO vs ileus. Significant PMH:  morbid obesity with BMI greater than 50, systolic/diastolic CHF with previous ejection fraction of 30 to 35% and grade 1 diastolic dysfunction, chronic right ventricular failure, history of DVT, hypothyroidism, depression and anxiety and chronic abdominal pain.    PT Comments    Pt progressing towards her physical therapy goals; benefits from positive reinforcement. Pt requiring min assist (+2 safety) for functional mobility. Performed multiple standing trials and multidirectional stepping (right/left, forward/backwards). Goal for next session to progress ambulation with close chair follow.     Recommendations for follow up therapy are one component of a multi-disciplinary discharge planning process, led by the attending physician.  Recommendations may be updated based on patient status, additional functional criteria and insurance authorization.  Follow Up Recommendations  Can patient physically be transported by private vehicle: No    Assistance Recommended at Discharge PRN  Patient can return home with the following Two people to help with walking and/or transfers;Two people to help with bathing/dressing/bathroom;Assistance with cooking/housework;Assist for transportation;Help with stairs or ramp for entrance   Equipment Recommendations  Hospital bed    Recommendations for Other Services       Precautions / Restrictions Precautions Precautions: Fall Precaution Comments: bilateral drains Restrictions Weight Bearing Restrictions: No     Mobility  Bed Mobility               General bed mobility comments: Sitting EOB upon  arrival    Transfers Overall transfer level: Needs assistance Equipment used: Rolling walker (2 wheels) Transfers: Sit to/from Stand Sit to Stand: From elevated surface, Min assist, +2 safety/equipment           General transfer comment: Use of momentum to power up, minA    Ambulation/Gait Ambulation/Gait assistance: Min assist, +2 safety/equipment Gait Distance (Feet): 4 Feet Assistive device: Rolling walker (2 wheels) (bari) Gait Pattern/deviations: Decreased stride length, Trunk flexed, Knees buckling Gait velocity: reduced     General Gait Details: Pt taking lateral side steps to R/L and four steps forward and four steps back on each trial. MinA for balance. cues for upright posture, pt with knee buckling   Stairs             Wheelchair Mobility    Modified Rankin (Stroke Patients Only)       Balance Overall balance assessment: Needs assistance Sitting-balance support: Feet supported Sitting balance-Leahy Scale: Fair Sitting balance - Comments: Prefers UE support   Standing balance support: Bilateral upper extremity supported Standing balance-Leahy Scale: Poor Standing balance comment: reliant on Bil UE support and up to modA to maintain balance                            Cognition Arousal/Alertness: Awake/alert Behavior During Therapy: Flat affect, Anxious Overall Cognitive Status: Within Functional Limits for tasks assessed                                 General Comments: WFL for basic conversation, able to relay specifics about how her stuomach feels. Slow processing noted, unsure of this is her baseline  Exercises      General Comments        Pertinent Vitals/Pain Pain Assessment Pain Assessment: Faces Faces Pain Scale: Hurts a little bit Pain Location: R side of abdomen Pain Descriptors / Indicators: Tightness, Guarding Pain Intervention(s): Monitored during session    Home Living                           Prior Function            PT Goals (current goals can now be found in the care plan section) Acute Rehab PT Goals Patient Stated Goal: less pain PT Goal Formulation: With patient Time For Goal Achievement: 11/15/22 Potential to Achieve Goals: Fair Progress towards PT goals: Progressing toward goals    Frequency    Min 3X/week      PT Plan Current plan remains appropriate    Co-evaluation              AM-PAC PT "6 Clicks" Mobility   Outcome Measure  Help needed turning from your back to your side while in a flat bed without using bedrails?: A Lot Help needed moving from lying on your back to sitting on the side of a flat bed without using bedrails?: A Little Help needed moving to and from a bed to a chair (including a wheelchair)?: A Little Help needed standing up from a chair using your arms (e.g., wheelchair or bedside chair)?: A Little Help needed to walk in hospital room?: Total Help needed climbing 3-5 steps with a railing? : Total 6 Click Score: 13    End of Session Equipment Utilized During Treatment: Gait belt Activity Tolerance: Patient tolerated treatment well Patient left: in bed (sitting EOB (refusing transferring to chair)) Nurse Communication: Mobility status PT Visit Diagnosis: Muscle weakness (generalized) (M62.81);Difficulty in walking, not elsewhere classified (R26.2);Pain;Unsteadiness on feet (R26.81);Other abnormalities of gait and mobility (R26.89) Pain - part of body:  (abdomen)     Time: 2197-5883 PT Time Calculation (min) (ACUTE ONLY): 19 min  Charges:  $Therapeutic Activity: 8-22 mins                     Lillia Pauls, PT, DPT Acute Rehabilitation Services Office [phone]    Norval Morton 11/07/2022, 4:19 PM

## 2022-11-07 NOTE — Progress Notes (Signed)
Speech Language Pathology Treatment: Dysphagia  Patient Details Name: [name] MRN: [id number] DOB: Mar 20, 1971 Today's Date: 11/07/2022 Time: 8921-1941 SLP Time Calculation (min) (ACUTE ONLY): 13 min  Assessment / Plan / Recommendation Clinical Impression  Pt was seen after POs resumed by MD, currently on FLD with instructions per surgery to advance as tolerated. Pt did not feel comfortable trying pudding off FLD, primarily concerned with potential for nausea. She denies concern for swallowing. She did consume thin liquids with no overt evidence of dysphagia or signs of aspiration. Pt is agreeable to advancing diet as tolerated per MD discretion and does not feel as though she needs help from a swallowing standpoint. Advised her that we could always be reordered if needed. SLP to sign off acutely.    HPI HPI: Patient is a 52 y.o. female with PMH: Bell's palsy, IDDM, chronic combined systolic and diastolic CHF, right sided heart failure, hypothyroidism, depression, anxiety, GERD. She was recently discharged from the hospital on 10/20/22 after admission for abdominal pain and intractable nausea and vomiting. She presented to the hospital for current admission on 10/23/22 with AMS. Sister called EMS after patient observed to have light headedness and brief LOC. In ED, patinet afebrile, saturating well on RA, slightly elevated HR and BP. CT head negative for acute findings and CTA head and neck was also negative. CDT ab/pelvis notable for diffuse inflammatory changes and stranding in the pelvis which is new from CT 9 days earlier. Per MD note from 10/24/22, patient's mentation was not adequate for PO's and she was kept NPO with order for SLP swallow evaluation next date. (3/26) Patient had FEES completed in 2022 and at that time, was exhibiting normal swallow mechanism.      SLP Plan  Continue with current plan of care      Recommendations for follow up therapy are one component of a  multi-disciplinary discharge planning process, led by the attending physician.  Recommendations may be updated based on patient status, additional functional criteria and insurance authorization.    Recommendations  Diet recommendations: Dysphagia 3 (mechanical soft);Thin liquid (advance as tolerated, per MD) Liquids provided via: Cup;Straw Medication Administration: Whole meds with liquid Supervision: Patient able to self feed;Intermittent supervision to cue for compensatory strategies Compensations: Minimize environmental distractions;Slow rate;Small sips/bites Postural Changes and/or Swallow Maneuvers: Seated upright 90 degrees;Upright 30-60 min after meal                  Oral care BID   PRN Dysphagia, unspecified (R13.10)     Continue with current plan of care     Mahala Menghini., M.A. CCC-SLP Acute Rehabilitation Services Office [phone]  Secure chat preferred   11/07/2022, 12:01 PM

## 2022-11-08 DIAGNOSIS — K651 Peritoneal abscess: Secondary | ICD-10-CM | POA: Diagnosis not present

## 2022-11-08 DIAGNOSIS — M329 Systemic lupus erythematosus, unspecified: Secondary | ICD-10-CM | POA: Diagnosis not present

## 2022-11-08 DIAGNOSIS — G9340 Encephalopathy, unspecified: Secondary | ICD-10-CM | POA: Diagnosis not present

## 2022-11-08 DIAGNOSIS — I5022 Chronic systolic (congestive) heart failure: Secondary | ICD-10-CM | POA: Diagnosis not present

## 2022-11-08 LAB — COMPREHENSIVE METABOLIC PANEL
ALT: 14 U/L (ref 0–44)
AST: 19 U/L (ref 15–41)
Albumin: 1.9 g/dL — ABNORMAL LOW (ref 3.5–5.0)
Alkaline Phosphatase: 93 U/L (ref 38–126)
Anion gap: 3 — ABNORMAL LOW (ref 5–15)
BUN: 13 mg/dL (ref 6–20)
CO2: 26 mmol/L (ref 22–32)
Calcium: 8.3 mg/dL — ABNORMAL LOW (ref 8.9–10.3)
Chloride: 105 mmol/L (ref 98–111)
Creatinine, Ser: 0.74 mg/dL (ref 0.44–1.00)
GFR, Estimated: >60 mL/min (ref >60)
Glucose, Bld: 198 mg/dL — ABNORMAL HIGH (ref 70–99)
Potassium: 4.8 mmol/L (ref 3.5–5.1)
Sodium: 134 mmol/L — ABNORMAL LOW (ref 135–145)
Total Bilirubin: 0.5 mg/dL (ref 0.3–1.2)
Total Protein: 5.5 g/dL — ABNORMAL LOW (ref 6.5–8.1)

## 2022-11-08 LAB — PHOSPHORUS: Phosphorus: 4.5 mg/dL (ref 2.5–4.6)

## 2022-11-08 LAB — GLUCOSE, CAPILLARY
Glucose-Capillary: 182 mg/dL — ABNORMAL HIGH (ref 70–99)
Glucose-Capillary: 193 mg/dL — ABNORMAL HIGH (ref 70–99)
Glucose-Capillary: 187 mg/dL — ABNORMAL HIGH (ref 70–99)
Glucose-Capillary: 197 mg/dL — ABNORMAL HIGH (ref 70–99)
Glucose-Capillary: 195 mg/dL — ABNORMAL HIGH (ref 70–99)
Glucose-Capillary: 195 mg/dL — ABNORMAL HIGH (ref 70–99)
Glucose-Capillary: 166 mg/dL — ABNORMAL HIGH (ref 70–99)
Glucose-Capillary: 147 mg/dL — ABNORMAL HIGH (ref 70–99)
Glucose-Capillary: 159 mg/dL — ABNORMAL HIGH (ref 70–99)

## 2022-11-08 LAB — CBC WITH DIFFERENTIAL/PLATELET
Abs Immature Granulocytes: 0.13 10*3/uL — ABNORMAL HIGH (ref 0.00–0.07)
Basophils Absolute: 0.1 10*3/uL (ref 0.0–0.1)
Basophils Relative: 0 %
Eosinophils Absolute: 0.7 10*3/uL — ABNORMAL HIGH (ref 0.0–0.5)
Eosinophils Relative: 6 %
HCT: 22.7 % — ABNORMAL LOW (ref 36.0–46.0)
Hemoglobin: 7.5 g/dL — ABNORMAL LOW (ref 12.0–15.0)
Immature Granulocytes: 1 %
Lymphocytes Relative: 23 %
Lymphs Abs: 2.8 10*3/uL (ref 0.7–4.0)
MCH: 29 pg (ref 26.0–34.0)
MCHC: 33 g/dL (ref 30.0–36.0)
MCV: 87.6 fL (ref 80.0–100.0)
Monocytes Absolute: 1.2 10*3/uL — ABNORMAL HIGH (ref 0.1–1.0)
Monocytes Relative: 10 %
Neutro Abs: 7.6 10*3/uL (ref 1.7–7.7)
Neutrophils Relative %: 60 %
Platelets: 413 10*3/uL — ABNORMAL HIGH (ref 150–400)
RBC: 2.59 MIL/uL — ABNORMAL LOW (ref 3.87–5.11)
RDW: 15.8 % — ABNORMAL HIGH (ref 11.5–15.5)
WBC: 12.6 10*3/uL — ABNORMAL HIGH (ref 4.0–10.5)
nRBC: 0 % (ref 0.0–0.2)

## 2022-11-08 LAB — MAGNESIUM: Magnesium: 2 mg/dL (ref 1.7–2.4)

## 2022-11-08 LAB — AEROBIC/ANAEROBIC CULTURE W GRAM STAIN (SURGICAL/DEEP WOUND)
Culture: NO GROWTH
Special Requests: NORMAL

## 2022-11-08 MED ORDER — TRAVASOL 10 % IV SOLN
INTRAVENOUS | Status: AC
  Filled 2022-11-08: qty 1123.2

## 2022-11-08 MED ORDER — PROSOURCE PLUS PO LIQD
30.0000 mL | Freq: Two times a day (BID) | ORAL | Status: DC
  Administered 2022-11-08 – 2022-11-19 (×13): 30 mL via ORAL
  Filled 2022-11-08 (×12): qty 30

## 2022-11-08 MED ORDER — BOOST / RESOURCE BREEZE PO LIQD CUSTOM
1.0000 | ORAL | Status: DC
  Administered 2022-11-08 – 2022-11-12 (×4): 1 via ORAL

## 2022-11-08 NOTE — Progress Notes (Signed)
Occupational Therapy Treatment Patient Details Name: [name] MRN: [id number] DOB: 01-28-1971 Today's Date: 11/08/2022   History of present illness Pt is a 52 y.o. F who presents 10/23/2022 with acute metabolic encephalopathy and severe sepsis secondary to intra-abdominal abscess. S/p drain placement 3/26. 3/30 x-ray showing SBO vs ileus. Significant PMH:  morbid obesity with BMI greater than 50, systolic/diastolic CHF with previous ejection fraction of 30 to 35% and grade 1 diastolic dysfunction, chronic right ventricular failure, history of DVT, hypothyroidism, depression and anxiety and chronic abdominal pain.   OT comments  This 52 yo female seen with PT today to see if we could progress ambulation with patient. Pt was able to ambulate forward for the first time and then stand and alternate stepping in place after that. Prior to this pt was on bedpan and needed A to get cleaned up--pt rolled in bed with use of rails with Mod I. Pt will continue to benefit from continued inpatient follow up therapy, <3 hours/day.    Recommendations for follow up therapy are one component of a multi-disciplinary discharge planning process, led by the attending physician.  Recommendations may be updated based on patient status, additional functional criteria and insurance authorization.    Assistance Recommended at Discharge Frequent or constant Supervision/Assistance  Patient can return home with the following  A lot of help with walking and/or transfers;A lot of help with bathing/dressing/bathroom;Assistance with cooking/housework;Assist for transportation;Help with stairs or ramp for entrance;Direct supervision/assist for financial management;Direct supervision/assist for medications management   Equipment Recommendations  Other (comment) (defer to next venue)       Precautions / Restrictions Precautions Precautions: Fall;Other (comment) Precaution Comments: bilateral drains Restrictions Weight  Bearing Restrictions: No       Mobility Bed Mobility Overal bed mobility: Needs Assistance Bed Mobility: Rolling, Sidelying to Sit Rolling: Min assist Sidelying to sit: Min assist       General bed mobility comments: Rolling to right/left for pericare, Min A to elevate trunk to get to EOB.    Transfers Overall transfer level: Needs assistance Equipment used: Rolling walker (2 wheels) Transfers: Sit to/from Stand, Bed to chair/wheelchair/BSC Sit to Stand: Min assist, +2 physical assistance           General transfer comment: Min A of 2 to power to standing from EOB x1, from chair x1 with cues for hand placement and use of momentum. Transferred to chair post ambulation bout.     Balance Overall balance assessment: Needs assistance Sitting-balance support: Feet supported, Single extremity supported Sitting balance-Leahy Scale: Fair     Standing balance support: During functional activity, Bilateral upper extremity supported Standing balance-Leahy Scale: Poor Standing balance comment: reliant on Bil UE support                           ADL either performed or assessed with clinical judgement   ADL Overall ADL's : Needs assistance/impaired                         Toilet Transfer: Minimal assistance;+2 for physical assistance;+2 for safety/equipment;Ambulation;Rolling walker (2 wheels);Stand-pivot Statistician Details (indicate cue type and reason): simulated bed> 6feet>sit in recliner behind her                Extremity/Trunk Assessment Upper Extremity Assessment Upper Extremity Assessment: Generalized weakness            Vision Patient Visual Report: No change from  baseline            Cognition Arousal/Alertness: Awake/alert Behavior During Therapy: Flat affect, Anxious Overall Cognitive Status: Within Functional Limits for tasks assessed                                                General Comments  Spo2>88% on RA throughout session    Pertinent Vitals/ Pain       Pain Assessment Pain Assessment: Faces Faces Pain Scale: Hurts whole lot Pain Location: drain location, abdomen Pain Descriptors / Indicators: Tightness, Guarding, Discomfort, Grimacing, Sore Pain Intervention(s): Monitored during session, Repositioned, Patient requesting pain meds-RN notified, Limited activity within patient's tolerance         Frequency  Min 2X/week        Progress Toward Goals  OT Goals(current goals can now be found in the care plan section)  Progress towards OT goals: Progressing toward goals  Acute Rehab OT Goals Patient Stated Goal: for knees to not be in pain when she ambulates OT Goal Formulation: With patient Time For Goal Achievement: 11/15/22 Potential to Achieve Goals: Fair  Plan Discharge plan remains appropriate    Co-evaluation    PT/OT/SLP Co-Evaluation/Treatment: Yes Reason for Co-Treatment: For patient/therapist safety;To address functional/ADL transfers PT goals addressed during session: Mobility/safety with mobility;Balance;Proper use of DME;Strengthening/ROM OT goals addressed during session: Strengthening/ROM;ADL's and self-care      AM-PAC OT "6 Clicks" Daily Activity     Outcome Measure   Help from another person eating meals?: None Help from another person taking care of personal grooming?: A Little Help from another person toileting, which includes using toliet, bedpan, or urinal?: Total Help from another person bathing (including washing, rinsing, drying)?: A Lot Help from another person to put on and taking off regular upper body clothing?: A Lot Help from another person to put on and taking off regular lower body clothing?: Total 6 Click Score: 13    End of Session Equipment Utilized During Treatment: Gait belt;Rolling walker (2 wheels)  OT Visit Diagnosis: Other abnormalities of gait and mobility (R26.89);Pain;Muscle weakness (generalized)  (M62.81) Pain - Right/Left: Right Pain - part of body:  (abdomen)   Activity Tolerance Patient limited by pain (but did better than day before with PT)   Patient Left in chair;with call bell/phone within reach;with chair alarm set   Nurse Communication Mobility status (bed needed to be changed)        Time: 3903-0092 OT Time Calculation (min): 32 min  Charges: OT General Charges $OT Visit: 1 Visit  Lindon Romp OT Acute Rehabilitation Services Office [phone]    Evette Georges 11/08/2022, 5:10 PM

## 2022-11-08 NOTE — Plan of Care (Signed)

## 2022-11-08 NOTE — Progress Notes (Signed)
Nutrition Follow-up  DOCUMENTATION CODES:  Morbid obesity  INTERVENTION:  TPN management per pharmacy Would not recommend reducing infusion rate of TPN until pt can consistently tolerate a full liquid diet Add nutrition supplements to see if pt is able to tolerate Boost Breeze po daily, each supplement provides 250 kcal and 9 grams of protein Prosource Plus BID to provide 100 kcal and 15g of protein per packet  NUTRITION DIAGNOSIS:  Inadequate oral intake related to poor appetite, nausea as evidenced by meal completion < 25%, NPO status, per patient/family report. - remains applicable  GOAL:  Patient will meet greater than or equal to 90% of their needs - progressing, being met with TPN  MONITOR:  I & O's, Labs, Weight trends, Diet advancement  REASON FOR ASSESSMENT:  Malnutrition Screening Tool    ASSESSMENT:  Pt with hx of Bell's Palsy, DM type 2, CHF, HTN, HLD, and GERD presented to ED with AMS. Recently admitted for nausea  and abdominal pain. Imaging showed a 5 x 5 cm fluid collection worrisome for a developing abscess.  3/26 - CT guided drain placed in IR 4/3 - PICC placed, TPN initiated  Pt resting in bed at the time of visit. States that she is doing ok with clear liquids but that anything more substantial is contributing to nausea.   TPN is currently infusing at 1mL/h which is goal and is providing 112 g of protein and 1988 kcals per day and meeting 100% of pt's estimated nutrition needs. As pt has had such poor intake for ~1 month, would not recommend decreasing rate of TPN until she is consistently tolerating a full liquid diet. Clear liquid diet provides minimal nutrition and, without the use of nutrition supplements, virtually no protein.  Did speak with pt about the goal of trying to increase her PO intake so that she could eventually be weaned from the TPN and transition back to normal means of nutrition. Pt had previously expressed interest in prosource and boost  breeze. Added those in for pt today to try and see if they were tolerable. Pt reports that she did have a prosource earlier and that it was not very good -  but since it was a small volume she was willing to consume them. Will also try boost breeze when it is brought to her. If pt likes boost breeze, could increase the frequency to help provide some nutrition orally.   Average Meal Intake: 3/25-4/1: 0-5% intake x 3 recorded meals  Nutritionally Relevant Medications: Scheduled Meds:  PROSource Plus  30 mL Oral BID BM   Boost Breeze  1 Container Oral Q24H   insulin aspart  0-20 Units Subcutaneous Q4H   insulin glargine-yfgn  10 Units Subcutaneous Daily   pantoprazole IV  40 mg Intravenous Q24H   polyethylene glycol  17 g Oral Daily   senna-docusate  1 tablet Oral QHS   Continuous Infusions:  ampicillin-sulbactam (UNASYN) IV 3 g (11/08/22 0854)   doxycycline (VIBRAMYCIN) IV 100 mg (11/08/22 1008)   TPN ADULT (ION) 90 mL/hr at 11/08/22 0205   TPN ADULT (ION)     PRN Meds: bisacodyl, ondansetron   Labs Reviewed: Na 134 CBG ranges from 166-220 mg/dL over the last 24 hours  NUTRITION - FOCUSED PHYSICAL EXAM: Flowsheet Row Most Recent Value  Orbital Region No depletion  Upper Arm Region No depletion  Thoracic and Lumbar Region No depletion  Buccal Region No depletion  Temple Region No depletion  Clavicle Bone Region No depletion  Clavicle and Acromion Bone Region No depletion  Scapular Bone Region No depletion  Dorsal Hand No depletion  Patellar Region No depletion  Anterior Thigh Region No depletion  Posterior Calf Region No depletion  Edema (RD Assessment) Mild  Hair Reviewed  Eyes Reviewed  Mouth Reviewed  Skin Reviewed  Nails Reviewed   Diet Order:   Diet Order             Diet full liquid Room service appropriate? Yes; Fluid consistency: Thin  Diet effective now                  EDUCATION NEEDS:  Education needs have been addressed  Skin:  Skin Assessment:  Reviewed RN Assessment  Last BM:  4/7  Height:  Ht Readings from Last 1 Encounters:  10/25/22 5\' 8"  (1.727 m)   Weight:  Wt Readings from Last 1 Encounters:  11/08/22 (!) 169.5 kg   Ideal Body Weight:  63.6 kg  BMI:  Body mass index is 56.82 kg/m.  Estimated Nutritional Needs:  Kcal:  1900-2100 kcal/d Protein:  100-120 g/d Fluid:  2L/d   Greig Castilla, RD, LDN Clinical Dietitian RD pager # available in [facility]  After hours/weekend pager # available in [facility]

## 2022-11-08 NOTE — Progress Notes (Signed)
PROGRESS NOTE    [name]  QQP:[id number] DOB: 1970-12-22 DOA: 10/23/2022 PCP: Verlon Au, MD   Brief Narrative:  The patient is a 52 year old African-American female with past medical history of morbid obesity with BMI greater than 50, systolic/diastolic CHF with previous ejection fraction of 30 to 35% and grade 1 diastolic dysfunction, chronic right ventricular failure, history of DVT, hypothyroidism, depression and anxiety and chronic abdominal pain who presented to the emergency room on 3/24 with complaints of lightheadedness and abdominal pain.  She was also found to be somewhat confused.  Workup revealed severe sepsis secondary to intra-abdominal abscess concerning for PID/TOA and case discussed with gynecology.  Mass not present on CT on 3/15.  Interventional radiology consulted and patient underwent drain placement 3/26.  Mentation much improved.   Patient had been on a limited diet full liquids due to complaints of abdominal pain.  Started having some nausea and vomiting 3/29.  Abdominal x-ray noted dilated small bowel loops concerning for developing ileus.  Symptoms have persisted and repeat x-ray on 3/30 confirms SBO versus ileus.  Patient has had a prolonged period without eating much, and with persistent ileus, started on PICC line and TPN 4/2.    Repeat CT scan done and showed a newly identified intra-abdominal abscess and so interventional radiology was consulted and they feel that she is a candidate for a repeat drain placement and so she has been made n.p.o. at midnight and her blood thinners are being held.  Plan is for vein placement in the a.m. with Dr. Fredia Sorrow.  General surgery was also consulted given her ileus versus a small bowel obstruction so general surgery recommended NG tube placement for decompression of small bowel obstruction protocol with Gastrografin delayed film however patient was not able to tolerate NG to placement.  SBO protocol was done via oral  Gastrografin and delayed films still pending.  She underwent drain placement yesterday for her new found abscess.   **Continues to have some abdominal pain but WBC is trending down.  General surgery is going to be repeating her KUB given that she had contrast in the stomach still and also had some diffuse small bowel dilation and colonic distention and colon and recommending continuing n.p.o. for now.  Will continue to mobilize the patient.  Nutritionist consulted and recommending continue TPN and not reducing the rate of TPN until she can consistently tolerate a full liquid diet.  ID recommending continuing Unasyn and transition to Augmentin when able to tolerate p.o.  They recommend 4 weeks of antibiotics from the last aspiration on 11/03/2022.  GYN was consulted given her pelvic abscess and they recommended transvaginal ultrasound and adding doxycycline to treat for PID as well.  General surgery has now signed off the case as diet was being advanced slowly and she was having bowel movements but has not had a bowel movement last 24 hours and did not feel as well so she has not been eating as much.  The interventional radiology team is likely planning on repeating her CT scan of the abdomen pelvis sometime this week.   Assessment and Plan:  Severe sepsis secondary to intra-abdominal abscess in patient with acute on chronic abdominal pain. -Patient met criteria for severe sepsis given fever, leukocytosis,  -AKI creatinine greater than 2 and abdominal source.   -CT abdomen shows evidence of a 5 x 5 cm fluid collection in the left hemipelvis concerning for developing abscess.  GYN was consulted.  Dr. Rito Ehrlich had with Dr.  Debroah Loop. Patient has not had sex in 4 years and the mass was not present on CT 3/15.   -GYN did not initially think that the patient is suffering from a tubo-ovarian abscess however General Surgery feels they should be seen for Tubo-ovarian abscess so we reached out Gynecology and they  formally consulted. -Gynecology evaluated and recommending adding Doxycycline 100 mg IV every 12 for 10 to 14 days and changing to Augmentin XR 2 g twice daily for 10 to 14 days but see IDs recommendations as below -Gynecology recommending obtaining a transvaginal ultrasound to better evaluate the pelvis and this was done and showed "Small amount of free fluid in the pelvis. 2. The ovaries are not visualized." -Patient underwent IR guided drain placement on 3/26.  -Following drain placement and antibiotic adjustment, white blood cell count and procalcitonin initially improved and started to trend back upward.   -Now with minimal output.  Intervention radiology following.   -Repeat CT Scan done and showed "Interval percutaneous drainage of the loculated inflammatory appearing fluid collection within the left adnexa with complete evacuation of the previously noted loculated fluid. Extensive surrounding inflammatory change and poorly loculated fluid within the right adnexa persist. New loculated rim enhancing fluid collection within the right lower quadrant abutting several loops of a distal small bowel measuring 3.2 x 7.4 x 5.0 cm. Developing mid small bowel obstruction with a single point of transition identified within the mid abdomen. Minimal right nonobstructing nephrolithiasis. Moderate subcutaneous body wall edema." -WBC Trend:  Recent Labs  Lab 11/02/22 1053 11/03/22 1700 11/04/22 0755 11/05/22 0405 11/06/22 1123 11/07/22 0455 11/08/22 0800  WBC 18.3* 15.4* 12.8* 14.5* 13.3* 14.7* 12.6*  -Infectious Disease has been consulted and following recommending IV Unasyn and transitioning to p.o. Augmentin at the time of discharge on treating for 4 weeks since last drain placement -Given new abscess and a developing mid small bowel obstruction have consulted general surgery as well as interventional radiology and the plan is for another drain placement in her new abscess; drain was placed via  CT-guided drainage of the right lower quadrant peritoneal fluid collection by Dr. Fredia Sorrow and she now has a 10 [place] drain the fluid sent for culture analysis currently showing no growth to date at 2 Days with no Anaerobes isolated - Per IR note on 11/07/22 Interventional radiology repeating CT Scan of the Abdomen and Pelvis tomorrow or the day after if drain output remains low   Syncope. Acute Metabolic encephalopathy/hypersomnolence-resolved -Initially presented with what appears to be a syncopal event. There was a concern for seizures, however EEG was unremarkable.   -Ammonia level, TSH, HIV, RPR all unremarkable.   -VBG negative for hypercarbia.   -Neurology evaluated and felt findings unrelated to any neurological issue.  Initial concern this could be secondary to pain medication.   -Patient's IV pain medicine was held and she became more appropriate and alert.   -Felt to be multifactorial secondary to decreased clearance from acute kidney injury, additional anesthesia and pain medication from intervention radiology for drain placement and IV pain medicine.   -By 3/28, patient much improved today despite getting continued pain medication is alert and oriented x 3 -Will need orthostatics prior to discharge and PT OT evaluation recommending SNF once medically stable   ? UTI -Patient's white blood cell count initially improved following antibiotics and drain placement, down to 13 on 3/29.  -However, since then has been steadily rising, currently at 23.  Urine not very impressive.   -Negative cultures.  Discussed with infectious disease and they feel white count more likely related to SBO/ileus.   -See Above   Persistent ileus vs pSBO, improving slowly  -Persisting.   -Treating underlying infection and electrolyte abnormalities.  Ice chips.   -Will have PT see to increase ambulation.   -Have added TPN due to prolonged period without nutrition.  -Rechecking abdominal CT for follow-up on  abscess and as above -Now she has a developing mid small bowel obstruction with a single point of transition identified within the mid abdomen. -General surgery has been consulted and agree with IR to percutaneously drain the second fluid collection and recommending NG tube placement for decompression and small bowel protocol with Gastrografin delayed film however patient refused NG tube placement currently; she did not tolerate NG tube placement but small bowel protocol done orally and there were no follow-up on her delayed films.  If she has worsening abdominal pain nausea vomiting they are recommending reattempting NG tube placement and feel that this can hopefully improve with conservative management -Repeat Abdominal X-Ray in the Am  -General surgery recommending optimizing electrolytes mobilization and keeping n.p.o sleep but now that she is doing better they recommended increasing from sips and chips to bring on a clear liquid diet today; CLD now being changed to FULL LIQUID DIET -Had a bowel movement and general surgery recommending continue bowel regimen including PRN suppositories -Drains are working well on both draining stool aspirate serosanguineous fluid.  The interventional radiology team will likely can repeat a CT scan of the abdomen pelvis tomorrow other day after if her drain output remains low -If tolerating diet okay with full liquid will go to soft in the morning and Dysphagia 3 diet per SLP recommendations will remain on hold for now given that she did not really tolerate very much   AKI, resolved. -Suspect ATN in the setting of sepsis, possibility of contrast-induced injury as well as prerenal etiology in the setting of poor p.o. intake.   -BUN/Cr Trend: Recent Labs  Lab 11/02/22 1053 11/03/22 0241 11/04/22 1130 11/05/22 0405 11/06/22 0615 11/07/22 0455 11/08/22 0800  BUN <5* 5* 8 10 9 9 13   CREATININE 0.73 0.64 0.67 0.69 0.66 0.64 0.74  -Improved with IVF and now  getting LR at 35 mL/hr as patient is on TPN as she is on TPN at 90 mL/hr -Continue to Monitor for S/Sx of Volume Overload  -Avoid Nephrotoxic Medications, Contrast Dyes, Hypotension and Dehydration to Ensure Adequate Renal Perfusion and will need to Renally Adjust Meds -Continue to Monitor and Trend Renal Function carefully and repeat CMP in the AM  -Monitor closely due to history of CHF.  Much improved by 3/28   Hypersomnolence. OHS. Obesity class III. -Complicates overall prognosis and care and patient at high risk for poor outome -Estimated body mass index is 56.82 kg/m as calculated from the following:   Height as of this encounter: 5\' 8"  (1.727 m).   Weight as of this encounter: 169.5 kg.  -Weight Loss and Dietary Counseling given -Patient was recommended to have an outpatient sleep evaluation during her recent hospitalization.   Chronic Combined Systolic and Diastolic CHF -Echocardiogram done during this hospitalization notes much improvement in ejection fraction and indeterminate diastolic function.   -BNP within normal limit. Intake/Output Summary (Last 24 hours) at 11/08/2022 2200 Last data filed at 11/08/2022 0446 Gross per 24 hour  Intake 3200.15 ml  Output 565 ml  Net 2635.15 ml  -Continue to Monitor for S/Sx of Volume Overload -Currently  appears euvolemic   Gastroparesis. -Patient is not on any medication for gastroparesis.   -Was Tolerating p.o. but still reporting some nausea and vomiting.  -Intolerant of Reglan.   -Abdominal x-ray notes multiple dilated air-filled loops and repeat CT scan as above.   -Repeat KUB done on 4/5 showed "Persistent dilated loops of small bowel which may reflect an ileus or partial small bowel obstruction. New percutaneous drain in the right lower quadrant. No evidence of extravasated contrast material." -He is doing little bit better and was not as nauseous and was having some bowel movements finally but did not feel as well today so we will  repeat a KUB in the morning -Will replace electrolytes accordingly. -Currently on Zofran. -Will resume home scopolamine patch.   Hypokalemia -Patient's K+ Level Trend: Recent Labs  Lab 11/02/22 1053 11/03/22 0241 11/04/22 1130 11/05/22 0405 11/06/22 0615 11/07/22 0455 11/08/22 0800  K 3.2* 3.4* 3.6 4.3 4.3 4.2 4.8  -Replete with TPN -Continue to Monitor and Replete as Necessary -Repeat CMP in the AM    Hypomagnesemia -Patient's Mag Level Trend: Recent Labs  Lab 11/02/22 1053 11/03/22 0241 11/04/22 1130 11/05/22 0405 11/06/22 0615 11/07/22 0455 11/08/22 0800  MG 1.6* 2.0 1.7 1.8 1.7 1.8 2.0  -Continue to Monitor and Replete as Necessary with TPN -Repeat Mag in the AM    Severe Protein Calorie Malnutrition Nutrition Status: Nutrition Problem: Inadequate oral intake Etiology: poor appetite, nausea Signs/Symptoms: meal completion < 25%, NPO status, per patient/family report Interventions: Refer to RD note for recommendations   History of PE. -Had a PE last October, told that she needs to be on Eliquis indefinitely.  Eliquis changed to sq Lovenox while npo but will repeat CT Scan and may resume oral AC if repeat CT Scan looks that IR will be ordering this week looks ok   Type 2 Diabetes Mellitus, uncontrolled with Hyperglycemia with Gastroparesis. -With long-term insulin use. -Patient is actually on U-500 insulin at home. -Now with SBO/ileus, decreased long-acting insulin down to 10 units only plus Q for her sliding scale. -Continue monitor CBGs per protocol -CBG Trend: Recent Labs  Lab 11/07/22 2022 11/07/22 2325 11/08/22 0328 11/08/22 0833 11/08/22 1224 11/08/22 1652 11/08/22 2012  GLUCAP 187* 166* 197* 195* 195* 166* 147*    Lupus and Sjogren's Syndrome. -Patient has on hydroxychloroquine, belimumab as well as Imuran which have all been held -Patient remains at risk for infections. -Continue Monitor for now.   Anxiety and Depression. -Continue home  regimen and C/w Quetiapine 200 mg po qHS   Hypothyroidism. -Continue IV Levothyroxine 40 mcg and change back to Levothyroxine 50 mcg po when able to tolerate diet fully    Overactive bladder. -On Ditropan, currently held due but may be able to add soon because Diet is being advanced as tolerated    GERD. -Continue PPI with Pantoprazole 40 mg po Daily but was changed to IV -If Diet can be further advanced and tolerate po can changed back to po   Diabetic Neuropathy. -Has been on Lyrica. -Due to encephalopathy it was held.  Restart slowly   Hyperlipidemia. -Holding Crestor until off of TPN    Oral candidiasis. -Completed a week of Fluconazole.   Hypoalbuminemia -Patient's Albumin Trend: Recent Labs  Lab 10/28/22 0728 11/02/22 1053 11/03/22 0241 11/04/22 1130 11/05/22 0405 11/07/22 0455 11/08/22 0800  ALBUMIN 1.7* 1.7* 1.7* 1.8* 1.9* 2.1* 1.9*  -Continue to Monitor and Trend and repeat CMP in the AM   Obesity -Complicates overall prognosis  and care -Estimated body mass index is 56.82 kg/m as calculated from the following:   Height as of this encounter: 5\' 8"  (1.727 m).   Weight as of this encounter: 169.5 kg.  -Weight Loss and Dietary Counseling given  DVT prophylaxis: Place and maintain sequential compression device Start: 10/25/22 1103    Code Status: Full Code Family Communication: No family present at bedside   Disposition Plan:  Level of care: Telemetry Medical Status is: Inpatient Remains inpatient appropriate because: Further clinical improvement and clearance by the specialist   Consultants:  Infectious Diseases General Surgery Interventional Radiology  Gynecology   Procedures:  As delineated as above  Antimicrobials:  Anti-infectives (From admission, onward)    Start     Dose/Rate Route Frequency Ordered Stop   11/07/22 1045  doxycycline (VIBRAMYCIN) 100 mg in sodium chloride 0.9 % 250 mL IVPB        100 mg 125 mL/hr over 120 Minutes  Intravenous Every 12 hours 11/07/22 0957     10/30/22 2000  Ampicillin-Sulbactam (UNASYN) 3 g in sodium chloride 0.9 % 100 mL IVPB        3 g 200 mL/hr over 30 Minutes Intravenous Every 6 hours 10/30/22 1530     10/26/22 2200  meropenem (MERREM) 2 g in sodium chloride 0.9 % 100 mL IVPB  Status:  Discontinued        2 g 280 mL/hr over 30 Minutes Intravenous Every 8 hours 10/26/22 1409 10/30/22 1530   10/26/22 1000  fluconazole (DIFLUCAN) tablet 100 mg       See Hyperspace for full Linked Orders Report.   100 mg Oral Daily 10/25/22 1412 10/31/22 1011   10/25/22 2000  doxycycline (VIBRAMYCIN) 100 mg in sodium chloride 0.9 % 250 mL IVPB  Status:  Discontinued        100 mg 125 mL/hr over 120 Minutes Intravenous Every 12 hours 10/25/22 1304 10/30/22 1530   10/25/22 1500  fluconazole (DIFLUCAN) tablet 200 mg       See Hyperspace for full Linked Orders Report.   200 mg Oral Daily 10/25/22 1412 10/25/22 1718   10/25/22 1400  meropenem (MERREM) 2 g in sodium chloride 0.9 % 100 mL IVPB  Status:  Discontinued        2 g 280 mL/hr over 30 Minutes Intravenous Every 12 hours 10/25/22 1304 10/26/22 1409   10/25/22 1200  doxycycline (VIBRA-TABS) tablet 100 mg  Status:  Discontinued        100 mg Oral Every 12 hours 10/25/22 1103 10/25/22 1109   10/24/22 0045  cefTRIAXone (ROCEPHIN) 2 g in sodium chloride 0.9 % 100 mL IVPB  Status:  Discontinued        2 g 200 mL/hr over 30 Minutes Intravenous Daily at bedtime 10/24/22 0025 10/25/22 1102   10/24/22 0045  metroNIDAZOLE (FLAGYL) IVPB 500 mg  Status:  Discontinued        500 mg 100 mL/hr over 60 Minutes Intravenous 2 times daily 10/24/22 0025 10/25/22 1102   10/24/22 0030  doxycycline (VIBRA-TABS) tablet 100 mg  Status:  Discontinued        100 mg Oral Every 12 hours 10/24/22 0025 10/25/22 1102   10/24/22 0000  piperacillin-tazobactam (ZOSYN) IVPB 3.375 g  Status:  Discontinued        3.375 g 100 mL/hr over 30 Minutes Intravenous  Once 10/23/22 2348  10/24/22 0025       Subjective: Seen and examined at bedside she was not well.  Did not have a bowel movement and did not really want to advance past a full liquid diet today.  Still has some nausea but thinks he is doing okay.  Interventional radiology planning on getting a CT scan sometime.  Patient is requesting a bedside commode.  No other concerns or complaints at this time.  Objective: Vitals:   11/08/22 0835 11/08/22 1150 11/08/22 1534 11/08/22 1926  BP: 116/65  122/72 138/75  Pulse: 95  93   Resp: 16  16 16   Temp: 98.3 F (36.8 C)  98.1 F (36.7 C) 98.1 F (36.7 C)  TempSrc: Oral  Oral Oral  SpO2: 95% 94% 96%   Weight:      Height:        Intake/Output Summary (Last 24 hours) at 11/08/2022 2156 Last data filed at 11/08/2022 0446 Gross per 24 hour  Intake 3200.15 ml  Output 565 ml  Net 2635.15 ml   Filed Weights   11/02/22 0500 11/03/22 0500 11/08/22 0500  Weight: (!) 164 kg (!) 164.8 kg (!) 169.5 kg   Examination: Physical Exam:  Constitutional: WN/WD super morbidly obese AAF in NAD appears calm Respiratory: Diminished to auscultation bilaterally, no wheezing, rales, rhonchi or crackles. Normal respiratory effort and patient is not tachypenic. No accessory muscle use. Unlabored breathing  Cardiovascular: RRR, no murmurs / rubs / gallops. S1 and S2 auscultated. No extremity edema.  Abdomen: Soft, a little-tender, distendede 2/2 body habitus and has 2 drains in place. Bowel sounds positive are a little diminished.  GU: Deferred. Musculoskeletal: No clubbing / cyanosis of digits/nails. No joint deformity upper and lower extremities.  Skin: No rashes, lesions, ulcers on a limited skin evaluation. No induration; Warm and dry.  Neurologic: CN 2-12 grossly intact with no focal deficits. Romberg sign cerebellar reflexes not assessed.  Psychiatric: Normal judgment and insight. Alert and oriented x 3. Normal mood and appropriate affect.   Data Reviewed: I have personally  reviewed following labs and imaging studies  CBC: Recent Labs  Lab 11/04/22 0755 11/05/22 0405 11/06/22 1123 11/07/22 0455 11/08/22 0800  WBC 12.8* 14.5* 13.3* 14.7* 12.6*  NEUTROABS 8.5* 9.0* 8.6* 8.7* 7.6  HGB 7.2* 7.4* 7.6* 8.9* 7.5*  HCT 22.2* 23.3* 23.0* 27.0* 22.7*  MCV 94.9 87.6 87.1 87.1 87.6  PLT 437* 457* 439* 489* 413*   Basic Metabolic Panel: Recent Labs  Lab 11/04/22 1130 11/05/22 0405 11/06/22 0615 11/07/22 0455 11/08/22 0800  NA 140 137 134* 134* 134*  K 3.6 4.3 4.3 4.2 4.8  CL 105 104 102 103 105  CO2 24 24 24 25 26   GLUCOSE 176* 177* 175* 192* 198*  BUN 8 10 9 9 13   CREATININE 0.67 0.69 0.66 0.64 0.74  CALCIUM 8.3* 8.5* 8.4* 8.5* 8.3*  MG 1.7 1.8 1.7 1.8 2.0  PHOS 3.3 3.7 4.1 4.4 4.5   GFR: Estimated Creatinine Clearance: 137.8 mL/min (by C-G formula based on SCr of 0.74 mg/dL). Liver Function Tests: Recent Labs  Lab 11/03/22 0241 11/04/22 1130 11/05/22 0405 11/07/22 0455 11/08/22 0800  AST 12* 14* 18 16 19   ALT 10 11 12 13 14   ALKPHOS 108 102 98 105 93  BILITOT 0.6 0.2* 0.3 0.6 0.5  PROT 5.5* 5.6* 5.8* 6.1* 5.5*  ALBUMIN 1.7* 1.8* 1.9* 2.1* 1.9*   No results for input(s): "LIPASE", "AMYLASE" in the last 168 hours. No results for input(s): "AMMONIA" in the last 168 hours. Coagulation Profile: No results for input(s): "INR", "PROTIME" in the last 168 hours. Cardiac  Enzymes: No results for input(s): "CKTOTAL", "CKMB", "CKMBINDEX", "TROPONINI" in the last 168 hours. BNP (last 3 results) No results for input(s): "PROBNP" in the last 8760 hours. HbA1C: No results for input(s): "HGBA1C" in the last 72 hours. CBG: Recent Labs  Lab 11/08/22 0328 11/08/22 0833 11/08/22 1224 11/08/22 1652 11/08/22 2012  GLUCAP 197* 195* 195* 166* 147*   Lipid Profile: Recent Labs    11/07/22 0455  TRIG 71   Thyroid Function Tests: No results for input(s): "TSH", "T4TOTAL", "FREET4", "T3FREE", "THYROIDAB" in the last 72 hours. Anemia Panel: No  results for input(s): "VITAMINB12", "FOLATE", "FERRITIN", "TIBC", "IRON", "RETICCTPCT" in the last 72 hours. Sepsis Labs: Recent Labs  Lab 11/02/22 0500  PROCALCITON 0.27   Recent Results (from the past 240 hour(s))  Urine Culture     Status: Abnormal   Collection Time: 10/30/22  3:00 AM   Specimen: Urine, Random  Result Value Ref Range Status   Specimen Description URINE, RANDOM  Final   Special Requests NONE Reflexed from [id number]  Final   Culture (A)  Final    <10,000 COLONIES/mL INSIGNIFICANT GROWTH Performed at [facility], [address] [place], [place] [address]    Report Status 10/31/2022 FINAL  Final  Aerobic/Anaerobic Culture w Gram Stain (surgical/deep wound)     Status: None   Collection Time: 11/03/22  1:08 PM   Specimen: Abscess  Result Value Ref Range Status   Specimen Description ABSCESS  Final   Special Requests Normal  Final   Gram Stain   Final    ABUNDANT WBC PRESENT, PREDOMINANTLY PMN NO ORGANISMS SEEN    Culture   Final    No growth aerobically or anaerobically. Performed at [facility], [address] [place], [place] [address]    Report Status 11/08/2022 FINAL  Final    Radiology Studies: US PELVIC COMPLETE WITH TRANSVAGINAL  Result Date: 11/07/2022 CLINICAL DATA:  Pelvic abscess. EXAM: TRANSABDOMINAL AND TRANSVAGINAL ULTRASOUND OF PELVIS TECHNIQUE: Both transabdominal and transvaginal ultrasound examinations of the pelvis were performed. Transabdominal technique was performed for global imaging of the pelvis including uterus, ovaries, adnexal regions, and pelvic cul-de-sac. It was necessary to proceed with endovaginal exam following the transabdominal exam to visualize the adnexal structures. COMPARISON:  CT pelvis 11/03/2022 FINDINGS: Uterus Measurements: 4.5 x 3.0 x 2.8 cm = volume: 20 mL. No fibroids or other mass visualized. Endometrium Thickness: 4.2 mm.  No focal abnormality visualized. Right ovary Not visualized Left ovary Not  visualized Other findings Small amount of free fluid in the pelvis. IMPRESSION: 1. Small amount of free fluid in the pelvis. 2. The ovaries are not visualized. Electronically Signed   By: Annia Beltrew  Davis M.D.   On: 11/07/2022 17:54    Scheduled Meds:  (feeding supplement) PROSource Plus  30 mL Oral BID BM   carvedilol  3.125 mg Oral BID WC   Chlorhexidine Gluconate Cloth  6 each Topical Daily   enoxaparin (LOVENOX) injection  120 mg Subcutaneous Q12H   feeding supplement  1 Container Oral Q24H   insulin aspart  0-20 Units Subcutaneous Q4H   insulin glargine-yfgn  10 Units Subcutaneous Daily   [START ON 11/11/2022] levothyroxine  40 mcg Intravenous Daily   pantoprazole (PROTONIX) IV  40 mg Intravenous Q24H   polyethylene glycol  17 g Oral Daily   QUEtiapine  200 mg Oral QHS   senna-docusate  1 tablet Oral QHS   sodium chloride flush  3 mL Intravenous Q12H   sodium  chloride flush  5 mL Intracatheter Q8H   sodium chloride flush  5 mL Intracatheter Q8H   Continuous Infusions:  ampicillin-sulbactam (UNASYN) IV 3 g (11/08/22 2129)   doxycycline (VIBRAMYCIN) IV 100 mg (11/08/22 1008)   TPN ADULT (ION) 90 mL/hr at 11/08/22 1724    LOS: 15 days   Marguerita Merles, DO [facility] Available via Epic secure chat 7am-7pm After these hours, please refer to coverage provider listed on [email or web address] 11/08/2022, 9:56 PM

## 2022-11-08 NOTE — Progress Notes (Addendum)
PHARMACY - TOTAL PARENTERAL NUTRITION CONSULT NOTE   Indication: Small bowel obstruction and prolonged inability to tolerate PO intake  Patient Measurements: Height: 5\' 8"  (172.7 cm) Weight: (!) 169.5 kg (373 lb 10.9 oz) IBW/kg (Calculated) : 63.9 TPN AdjBW (KG): 86.4 Body mass index is 56.82 kg/m.  Assessment:  52 yo female with PMH of Bell's palsy, DM, CHF, hypothyroidism, hx DVT on Eliquis. Recently discharged on 3/21 after admission for abdominal pain and intractable nausea/vomiting. Brought back to ED on 3/24 for AMS and loss of consciousness. Endorsed abdominal pain and nausea/vomiting on day of admission. Pharmacy has been consulted on 4/3 to begin TPN as patient now with evidence of SBO on imaging and prolonged poor PO intake.   Discussed with patient on 4/3, she states that she really has not been able to tolerate a meal since before her previous admission ~3/16. Has a few juice cups and water cup on her tray, but states she is really only able to drink liquids when she is taking meds. Intake causes her to experience nausea and abdominal pain. Patient stated that just thinking about food/liquid/PO intake during our discussion made her feel nauseous.   Patient now with BM overnight, diet increased to full liquids.   Glucose / Insulin: hx T2DM, last A1c 7.4% 05/2022, on U-500 100 units TID, metformin, Jardiance, and Ozempic PTA >> was started on Semglee 100 units BID here but reduced down to Semglee 10 units QD on 4/1 for low BG's secondary to poor PO intake.  - CBGs up to 187-198 - 16 units of SSI/24h while on rSSI  Electrolytes: Na 134, Mg 2.0, Phos: 4.5, K: 4.8,  all others WNL and stable - Goal K >/= 4 and Mag >/= 2 Renal: Scr <1, BUN 13 Hepatic: 4/9: LFTs wnl, 4/8: Tgs 71 Intake / Output: UOP 0.2mL/kg/hr, +11L this admission, LBM 4/7, drain output 35 mL MIVF: none GI Imaging: - 3/24 CTAP: inflammatory/infectious process such as PID w/ developing abscess, no bowel  obstruction - 3/29 Abd X-ray: multiple dilated air-filled loops of small and large bowel, could represent ileus or developing obstruction - 4/2 CTAP: new loculated rim enhancing fluid collection w/in the RLQ, several loops of distal small bowel, developing mid small bowel obstruction - 4/5 Abd x-ray: persistent dilated loops of small bowel which may reflect ileus or pSBO - 4/6 Abd Xray: no significant change in small bowel dilation GI Surgeries / Procedures:  - 3/26: CT-guided left pelvic abscess drain placement  Central access: 4/3 TPN start date: 4/3  Nutritional Goals: Goal TPN rate is 90 mL/hr (provides 112 g of protein and 1988 kcals per day)  RD Assessment: Estimated Needs Total Energy Estimated Needs: 1900-2100 kcal/d Total Protein Estimated Needs: 100-120 g/d Total Fluid Estimated Needs: 2L/d  Current Nutrition:  TPN Full liquids started 4/8 - spoke with patient on 4/9, she has mostly only been able to keep down liquids (cranberry juice), she said she tried pudding last night but got nauseous and was unable to finish, no breakfast eaten this AM.     Plan:  Continue TPN at 24mL/hr at 1800 providing 112g AA and 1988 kcal meeting 100% of nutritional needs.  Electrolytes in TPN: increase Na to 156mEq/L, decrease K 16mEq/L (has been steadily increasing over past several days), Ca 59mEq/L, Mg 8 mEq/L, and decrease Phos 70mmol/L (last decreased 4/8 to 59mmol/L). Cl:Ac 1:1 Add standard MVI and trace elements to TPN Continue resistant q4h SSI and adjust as needed, patient resistant at baseline,  utilizing U-500 PTA.  Will add 10 u of insulin into TPN. Communicated to MD to ensure no further adjustments.  Monitor TPN labs on Mon/Thurs and PRN   F/u ability to advance diet  Estill Batten, PharmD, [facility]  11/08/2022 7:12 AM

## 2022-11-08 NOTE — Progress Notes (Signed)
Physical Therapy Treatment Patient Details Name: [name] MRN: [id number] DOB: [birth year] Today's Date: 11/08/2022   History of Present Illness Pt is a 52 y.o. F who presents 10/23/2022 with acute metabolic encephalopathy and severe sepsis secondary to intra-abdominal abscess. S/p drain placement 3/26. 3/30 x-ray showing SBO vs ileus. Significant PMH:  morbid obesity with BMI greater than 50, systolic/diastolic CHF with previous ejection fraction of 30 to 35% and grade 1 diastolic dysfunction, chronic right ventricular failure, history of DVT, hypothyroidism, depression and anxiety and chronic abdominal pain.    PT Comments    Patient progressing slowly towards PT goals. Session focused on bed mobility, transfers and initiation of gait training. Tolerated gait training with use of RW for support, close chair follow and Min A of 2 for safety/balance. Continues to have weakness in bil knees and increased pain in abdomen impacting progress/mobility. Requires Min A of 2 for standing. Worked on weight shifting and marching in place. Pt appears anxious. Encouraged to sit up in chair more frequently to be OOB. Continues to be appropriate for post acute rehab. Will follow.    Recommendations for follow up therapy are one component of a multi-disciplinary discharge planning process, led by the attending physician.  Recommendations may be updated based on patient status, additional functional criteria and insurance authorization.  Follow Up Recommendations  Can patient physically be transported by private vehicle: No    Assistance Recommended at Discharge PRN  Patient can return home with the following Two people to help with walking and/or transfers;Two people to help with bathing/dressing/bathroom;Assistance with cooking/housework;Assist for transportation;Help with stairs or ramp for entrance   Equipment Recommendations  Hospital bed    Recommendations for Other Services       Precautions /  Restrictions Precautions Precautions: Fall;Other (comment) Precaution Comments: bilateral drains Restrictions Weight Bearing Restrictions: No     Mobility  Bed Mobility Overal bed mobility: Needs Assistance Bed Mobility: Rolling, Sidelying to Sit Rolling: Min assist Sidelying to sit: Min assist       General bed mobility comments: Rolling to right/left for pericare, Min A to elevate trunk to get to EOB.    Transfers Overall transfer level: Needs assistance Equipment used: Rolling walker (2 wheels) Transfers: Sit to/from Stand, Bed to chair/wheelchair/BSC Sit to Stand: Min assist, +2 physical assistance           General transfer comment: Min A of 2 to power to standing from EOB x1, from chair x1 with cues for hand placement and use of momentum. Transferred to chair post ambulation bout.    Ambulation/Gait Ambulation/Gait assistance: Min assist, +2 safety/equipment Gait Distance (Feet): 5 Feet Assistive device: Rolling walker (2 wheels) (bari) Gait Pattern/deviations: Decreased stride length, Trunk flexed, Wide base of support Gait velocity: reduced   Pre-gait activities: Weight shifting x10, marching in place x10 with RW General Gait Details: Slow, mildly unsteady gait with wide BoS and effortful steps, right knee instability noted but no buckling. Pt appears anxious. Min A for balance andc lose chair follow   Stairs             Wheelchair Mobility    Modified Rankin (Stroke Patients Only)       Balance Overall balance assessment: Needs assistance Sitting-balance support: Feet supported, Single extremity supported Sitting balance-Leahy Scale: Fair Sitting balance - Comments: Prefers UE support   Standing balance support: During functional activity, Bilateral upper extremity supported Standing balance-Leahy Scale: Poor Standing balance comment: reliant on Bil UE support  Cognition Arousal/Alertness:  Awake/alert Behavior During Therapy: Flat affect, Anxious Overall Cognitive Status: Within Functional Limits for tasks assessed                                          Exercises      General Comments General comments (skin integrity, edema, etc.): Sp02 >88% on RA throughout activity.      Pertinent Vitals/Pain Pain Assessment Pain Assessment: Faces Faces Pain Scale: Hurts whole lot Pain Location: drain location, abdomen Pain Descriptors / Indicators: Tightness, Guarding, Discomfort, Grimacing, Sore Pain Intervention(s): Monitored during session, Repositioned, Patient requesting pain meds-RN notified, Limited activity within patient's tolerance    Home Living                          Prior Function            PT Goals (current goals can now be found in the care plan section) Progress towards PT goals: Progressing toward goals    Frequency    Min 3X/week      PT Plan Current plan remains appropriate    Co-evaluation              AM-PAC PT "6 Clicks" Mobility   Outcome Measure  Help needed turning from your back to your side while in a flat bed without using bedrails?: A Little Help needed moving from lying on your back to sitting on the side of a flat bed without using bedrails?: A Little Help needed moving to and from a bed to a chair (including a wheelchair)?: A Little Help needed standing up from a chair using your arms (e.g., wheelchair or bedside chair)?: A Little Help needed to walk in hospital room?: Total Help needed climbing 3-5 steps with a railing? : Total 6 Click Score: 14    End of Session Equipment Utilized During Treatment: Gait belt Activity Tolerance: Patient limited by pain;Patient tolerated treatment well Patient left: in chair;with call bell/phone within reach Nurse Communication: Mobility status;Patient requests pain meds (linen change) PT Visit Diagnosis: Muscle weakness (generalized) (M62.81);Difficulty  in walking, not elsewhere classified (R26.2);Pain;Unsteadiness on feet (R26.81);Other abnormalities of gait and mobility (R26.89) Pain - part of body:  (abdomen)     Time: 6861-6837 PT Time Calculation (min) (ACUTE ONLY): 32 min  Charges:  $Gait Training: 8-22 mins                     Vale Haven, PT, DPT Acute Rehabilitation Services Secure chat preferred Office [phone]      Blake Divine A Matthias Bogus 11/08/2022, 1:09 PM

## 2022-11-09 ENCOUNTER — Inpatient Hospital Stay (HOSPITAL_COMMUNITY): Payer: Medicaid Other

## 2022-11-09 DIAGNOSIS — M3500 Sicca syndrome, unspecified: Secondary | ICD-10-CM | POA: Diagnosis not present

## 2022-11-09 DIAGNOSIS — N730 Acute parametritis and pelvic cellulitis: Secondary | ICD-10-CM | POA: Diagnosis not present

## 2022-11-09 DIAGNOSIS — G9340 Encephalopathy, unspecified: Secondary | ICD-10-CM | POA: Diagnosis not present

## 2022-11-09 DIAGNOSIS — R9431 Abnormal electrocardiogram [ECG] [EKG]: Secondary | ICD-10-CM | POA: Diagnosis not present

## 2022-11-09 DIAGNOSIS — K651 Peritoneal abscess: Secondary | ICD-10-CM | POA: Insufficient documentation

## 2022-11-09 DIAGNOSIS — A419 Sepsis, unspecified organism: Secondary | ICD-10-CM

## 2022-11-09 LAB — GLUCOSE, CAPILLARY
Glucose-Capillary: 173 mg/dL — ABNORMAL HIGH (ref 70–99)
Glucose-Capillary: 147 mg/dL — ABNORMAL HIGH (ref 70–99)
Glucose-Capillary: 188 mg/dL — ABNORMAL HIGH (ref 70–99)
Glucose-Capillary: 224 mg/dL — ABNORMAL HIGH (ref 70–99)
Glucose-Capillary: 183 mg/dL — ABNORMAL HIGH (ref 70–99)
Glucose-Capillary: 161 mg/dL — ABNORMAL HIGH (ref 70–99)

## 2022-11-09 LAB — COMPREHENSIVE METABOLIC PANEL
ALT: 16 U/L (ref 0–44)
AST: 19 U/L (ref 15–41)
Albumin: 1.9 g/dL — ABNORMAL LOW (ref 3.5–5.0)
Alkaline Phosphatase: 87 U/L (ref 38–126)
Anion gap: 5 (ref 5–15)
BUN: 11 mg/dL (ref 6–20)
CO2: 26 mmol/L (ref 22–32)
Calcium: 8.3 mg/dL — ABNORMAL LOW (ref 8.9–10.3)
Chloride: 103 mmol/L (ref 98–111)
Creatinine, Ser: 0.71 mg/dL (ref 0.44–1.00)
GFR, Estimated: >60 mL/min (ref >60)
Glucose, Bld: 175 mg/dL — ABNORMAL HIGH (ref 70–99)
Potassium: 4.1 mmol/L (ref 3.5–5.1)
Sodium: 134 mmol/L — ABNORMAL LOW (ref 135–145)
Total Bilirubin: 0.5 mg/dL (ref 0.3–1.2)
Total Protein: 5.6 g/dL — ABNORMAL LOW (ref 6.5–8.1)

## 2022-11-09 LAB — CBC WITH DIFFERENTIAL/PLATELET
Abs Immature Granulocytes: 0.08 10*3/uL — ABNORMAL HIGH (ref 0.00–0.07)
Basophils Absolute: 0 10*3/uL (ref 0.0–0.1)
Basophils Relative: 0 %
Eosinophils Absolute: 0.8 10*3/uL — ABNORMAL HIGH (ref 0.0–0.5)
Eosinophils Relative: 8 %
HCT: 22.3 % — ABNORMAL LOW (ref 36.0–46.0)
Hemoglobin: 7.4 g/dL — ABNORMAL LOW (ref 12.0–15.0)
Immature Granulocytes: 1 %
Lymphocytes Relative: 24 %
Lymphs Abs: 2.7 10*3/uL (ref 0.7–4.0)
MCH: 29.1 pg (ref 26.0–34.0)
MCHC: 33.2 g/dL (ref 30.0–36.0)
MCV: 87.8 fL (ref 80.0–100.0)
Monocytes Absolute: 1.1 10*3/uL — ABNORMAL HIGH (ref 0.1–1.0)
Monocytes Relative: 10 %
Neutro Abs: 6.3 10*3/uL (ref 1.7–7.7)
Neutrophils Relative %: 57 %
Platelets: 363 10*3/uL (ref 150–400)
RBC: 2.54 MIL/uL — ABNORMAL LOW (ref 3.87–5.11)
RDW: 16.1 % — ABNORMAL HIGH (ref 11.5–15.5)
WBC: 11.1 10*3/uL — ABNORMAL HIGH (ref 4.0–10.5)
nRBC: 0 % (ref 0.0–0.2)

## 2022-11-09 LAB — PHOSPHORUS: Phosphorus: 4.4 mg/dL (ref 2.5–4.6)

## 2022-11-09 LAB — MAGNESIUM: Magnesium: 1.7 mg/dL (ref 1.7–2.4)

## 2022-11-09 LAB — HEPARIN ANTI-XA: Heparin LMW: 0.95 IU/mL

## 2022-11-09 MED ORDER — TRAVASOL 10 % IV SOLN
INTRAVENOUS | Status: AC
  Filled 2022-11-09: qty 1123.2

## 2022-11-09 MED ORDER — ENOXAPARIN SODIUM 80 MG/0.8ML IJ SOSY
80.0000 mg | PREFILLED_SYRINGE | INTRAMUSCULAR | Status: DC
  Administered 2022-11-10 – 2022-11-19 (×10): 80 mg via SUBCUTANEOUS
  Filled 2022-11-09 (×10): qty 0.8

## 2022-11-09 MED ORDER — MAGNESIUM SULFATE 2 GM/50ML IV SOLN
2.0000 g | Freq: Once | INTRAVENOUS | Status: AC
  Administered 2022-11-09: 2 g via INTRAVENOUS
  Filled 2022-11-09: qty 50

## 2022-11-09 NOTE — Progress Notes (Signed)
PROGRESS NOTE  [name]:[id number] DOB: Mar 31, 1971   PCP: Verlon Au, MD  Patient is from: Home.  DOA: 10/23/2022 LOS: 16  Chief complaints Chief Complaint  Patient presents with   Code Stroke     Brief Narrative / Interim history: 53 year old F with PMH of morbid obesity, combined CHF, RV failure, DVT/PE on prophylactic dose Eliquis, hypothyroidism, anxiety and depression presenting with lightheadedness, abdominal pain and confusion, and admitted for severe sepsis with acute encephalopathy in the setting of intra-abdominal abscess concerning for PID/TOA.  Patient was started on antibiotics.  Gynecology, general surgery and interventional radiology consulted.  Patient had left pelvic drain placed on 3/26.  Fluid culture with Streptococcus anginosus.  Mentation improved.  She developed nausea and vomiting.  Concerned about ileus/SBO on KUB on 3/30.  NG tube placed for SBO protocol but patient was not able to tolerate.  PICC line placed and started on TPN on 4/2.  Repeat CT abdomen and pelvis on 4/2 with complete evacuation of left adnexal fluid collection but persistent poorly loculated fluid within the right adnexa and new loculated rim-enhancing fluid collection within RLQ measuring 3.2 x 7.4 x 5.0 cm, and developing mid SBO with single-point transition.  Patient had RLQ drain placement on 4/4.  Gyn recommended TVUS and doxycycline to treat for possible PID.  TVUS showed small amount of free fluid in pelvis and the ovaries were not visualized.  ID recommended continuing IV Unasyn and transition to p.o. Augmentin when able to tolerate p.o.  Currently on TPN, FLD, IV ampicillin and doxycycline   Subjective: Seen and examined earlier this morning.  No major events overnight of this morning.  She reports pain across lower abdomen after she had some chicken soup yesterday.  Has not a bowel movement today.  Anxious to try solid food.  About 53 cc from drain.  Objective: Vitals:    11/09/22 0312 11/09/22 0500 11/09/22 0803 11/09/22 1239  BP: 127/60  122/61 129/60  Pulse: 90  80 94  Resp: 14  11 20   Temp: 98.2 F (36.8 C)  98 F (36.7 C) 98.3 F (36.8 C)  TempSrc: Oral  Oral Oral  SpO2: 98%  98% 98%  Weight:  (!) 168.7 kg    Height:        Examination:  GENERAL: No apparent distress.  Nontoxic. HEENT: MMM.  Vision and hearing grossly intact.  NECK: Supple.  No apparent JVD.  RESP:  No IWOB.  Fair aeration bilaterally. CVS:  RRR. Heart sounds normal.  ABD/GI/GU: BS+. Abd soft.  Tenderness across lower abdomen.  Drain to LLQ.  JP drain to RLQ MSK/EXT:  Moves extremities. No apparent deformity. No edema.  SKIN: no apparent skin lesion or wound NEURO: Awake, alert and oriented appropriately.  No apparent focal neuro deficit. PSYCH: Calm. Normal affect.   Procedures:  3/26-CT-guided left pelvic drain 4/4-CT-guided RLQ drain  Microbiology summarized: 3/24-wet prep negative 3/26-drain culture with Streptococcus anginosus 3/26-blood cultures NGTD 3/31-urine culture with insignificant growth 4/4-drain culture negative.  Assessment and plan: Principal Problem:   Acute encephalopathy Active Problems:   Hypertension complicating diabetes   Chronic systolic CHF (congestive heart failure)   SJOGREN'S SYNDROME   QT prolongation   Type 2 diabetes mellitus with hyperlipidemia   History of CVA (cerebrovascular accident)   Major depressive disorder with single episode, in partial remission   SLE (systemic lupus erythematosus related syndrome)   PID (acute pelvic inflammatory disease)  Severe sepsis secondary to intra-abdominal abscess concerning  for PID/TOA: Met criteria for severe sepsis with fever, leukocytosis, AKI and encephalopathy. -Imagings as above. -S/p LLQ drain placement on 3/26 and RLQ drain placement on 4/4 -Culture data as above.  -On IV Unasyn and doxycycline for a total of 14 days -Per IR, repeat CT once drain output minimal.   Acute  metabolic encephalopathy: Likely due to the above.  Other encephalopathy workup including ammonia, TSH, HIV, RPR, EEG, CT head and VBG without significant finding.  Encephalopathy seems to have resolved. -Reorientation and delirium precautions -Minimize or avoid sedating medications  Persistent ileus versus partial SBO: Slowly improving. -Continue TPN -SLP recommended dysphagia 3 diet but patient is anxious to try solid food yet.  Encouraged. -PT/OT for mobility  Chronic combined CHF with recovered EF: TTE on 3/25 with LVEF of 55 to 60% (30 to 35% in 2022), indeterminate DD and normal RV.  Difficult to assess fluid status due to body habitus.  No significant cardiopulmonary symptoms.  On torsemide 20 mg daily and Jardiance 10 mg daily at home. -Continue monitoring fluid and respiratory status -Continue holding home torsemide and Jardiance.  Uncontrolled IDDM-2 with hyperglycemia: A1c 7.4% in 05/2022 Recent Labs  Lab 11/08/22 2321 11/09/22 0308 11/09/22 0800 11/09/22 1238 11/09/22 1608  GLUCAP 159* 173* 147* 188* 224*  -Recheck hemoglobin A1c -Continue current insulin regimen  History of PE/DVT: On low-dose Eliquis at home. -I agree with pharmacy about reducing Lovenox to prophylactic dose  Lupus and Sjogren's Syndrome: On Plaquenil, Benlysta and Humira. -Holding home meds in the setting of active infection  Syncope?  Due to #1.  TTE reassuring. -Treat infection as above  UTI ruled out.   AKI: Resolved.   Gastroparesis?  Intolerant to Reglan. -Continue Zofran.   Hypokalemia/hypomagnesemia -Monitor replenish as appropriate   Mood disorder: Somewhat anxious.  Pharmacy not able to verify home meds. -On Seroquel 200 mg at night.  Seems to be on 300 mg at home -Xanax and Wellbutrin discontinued early in the course   Hypothyroidism -Continue home Synthroid   Overactive bladder: Seems to be stable off home oxybutynin.   GERD -Continue PPI   Diabetic Neuropathy: Lyrica  discontinued due to encephalopathy   Hyperlipidemia. -Holding Crestor until off of TPN    Oral candidiasis. -Completed a week of Fluconazole.   Morbid obesity with significant comorbidity as above Body mass index is 56.55 kg/m.  Inadequate oral intake.  Severe malnutrition ruled out Nutrition Problem: Inadequate oral intake Etiology: poor appetite, nausea Signs/Symptoms: meal completion < 25%, NPO status, per patient/family report Interventions: Refer to RD note for recommendations   DVT prophylaxis:  Place and maintain sequential compression device Start: 10/25/22 1103  Code Status: Full code Family Communication: None at bedside Level of care: Telemetry Medical Status is: Inpatient Remains inpatient appropriate because: Intra-abdominal infection/abscess   Final disposition: TBD Consultants:  General surgery Interventional radiology Gynecology Infectious disease  55 minutes with more than 50% spent in reviewing records, counseling patient/family and coordinating care.   Sch Meds:  Scheduled Meds:  (feeding supplement) PROSource Plus  30 mL Oral BID BM   carvedilol  3.125 mg Oral BID WC   Chlorhexidine Gluconate Cloth  6 each Topical Daily   [START ON 11/10/2022] enoxaparin (LOVENOX) injection  80 mg Subcutaneous Q24H   feeding supplement  1 Container Oral Q24H   insulin aspart  0-20 Units Subcutaneous Q4H   insulin glargine-yfgn  10 Units Subcutaneous Daily   [START ON 11/11/2022] levothyroxine  40 mcg Intravenous Daily   pantoprazole (  PROTONIX) IV  40 mg Intravenous Q24H   polyethylene glycol  17 g Oral Daily   QUEtiapine  200 mg Oral QHS   senna-docusate  1 tablet Oral QHS   sodium chloride flush  3 mL Intravenous Q12H   sodium chloride flush  5 mL Intracatheter Q8H   sodium chloride flush  5 mL Intracatheter Q8H   Continuous Infusions:  ampicillin-sulbactam (UNASYN) IV Stopped (11/09/22 1402)   doxycycline (VIBRAMYCIN) IV Stopped (11/09/22 1223)   TPN  ADULT (ION) 90 mL/hr at 11/09/22 1550   TPN ADULT (ION)     PRN Meds:.acetaminophen **OR** acetaminophen, bisacodyl, HYDROmorphone (DILAUDID) injection, hydrOXYzine, ondansetron (ZOFRAN) IV, sodium chloride flush  Antimicrobials: Anti-infectives (From admission, onward)    Start     Dose/Rate Route Frequency Ordered Stop   11/07/22 1045  doxycycline (VIBRAMYCIN) 100 mg in sodium chloride 0.9 % 250 mL IVPB        100 mg 125 mL/hr over 120 Minutes Intravenous Every 12 hours 11/07/22 0957     10/30/22 2000  Ampicillin-Sulbactam (UNASYN) 3 g in sodium chloride 0.9 % 100 mL IVPB        3 g 200 mL/hr over 30 Minutes Intravenous Every 6 hours 10/30/22 1530     10/26/22 2200  meropenem (MERREM) 2 g in sodium chloride 0.9 % 100 mL IVPB  Status:  Discontinued        2 g 280 mL/hr over 30 Minutes Intravenous Every 8 hours 10/26/22 1409 10/30/22 1530   10/26/22 1000  fluconazole (DIFLUCAN) tablet 100 mg       See Hyperspace for full Linked Orders Report.   100 mg Oral Daily 10/25/22 1412 10/31/22 1011   10/25/22 2000  doxycycline (VIBRAMYCIN) 100 mg in sodium chloride 0.9 % 250 mL IVPB  Status:  Discontinued        100 mg 125 mL/hr over 120 Minutes Intravenous Every 12 hours 10/25/22 1304 10/30/22 1530   10/25/22 1500  fluconazole (DIFLUCAN) tablet 200 mg       See Hyperspace for full Linked Orders Report.   200 mg Oral Daily 10/25/22 1412 10/25/22 1718   10/25/22 1400  meropenem (MERREM) 2 g in sodium chloride 0.9 % 100 mL IVPB  Status:  Discontinued        2 g 280 mL/hr over 30 Minutes Intravenous Every 12 hours 10/25/22 1304 10/26/22 1409   10/25/22 1200  doxycycline (VIBRA-TABS) tablet 100 mg  Status:  Discontinued        100 mg Oral Every 12 hours 10/25/22 1103 10/25/22 1109   10/24/22 0045  cefTRIAXone (ROCEPHIN) 2 g in sodium chloride 0.9 % 100 mL IVPB  Status:  Discontinued        2 g 200 mL/hr over 30 Minutes Intravenous Daily at bedtime 10/24/22 0025 10/25/22 1102   10/24/22 0045   metroNIDAZOLE (FLAGYL) IVPB 500 mg  Status:  Discontinued        500 mg 100 mL/hr over 60 Minutes Intravenous 2 times daily 10/24/22 0025 10/25/22 1102   10/24/22 0030  doxycycline (VIBRA-TABS) tablet 100 mg  Status:  Discontinued        100 mg Oral Every 12 hours 10/24/22 0025 10/25/22 1102   10/24/22 0000  piperacillin-tazobactam (ZOSYN) IVPB 3.375 g  Status:  Discontinued        3.375 g 100 mL/hr over 30 Minutes Intravenous  Once 10/23/22 2348 10/24/22 0025        I have personally reviewed the following labs and  images: CBC: Recent Labs  Lab 11/05/22 0405 11/06/22 1123 11/07/22 0455 11/08/22 0800 11/09/22 0557  WBC 14.5* 13.3* 14.7* 12.6* 11.1*  NEUTROABS 9.0* 8.6* 8.7* 7.6 6.3  HGB 7.4* 7.6* 8.9* 7.5* 7.4*  HCT 23.3* 23.0* 27.0* 22.7* 22.3*  MCV 87.6 87.1 87.1 87.6 87.8  PLT 457* 439* 489* 413* 363   BMP &GFR Recent Labs  Lab 11/05/22 0405 11/06/22 0615 11/07/22 0455 11/08/22 0800 11/09/22 0557  NA 137 134* 134* 134* 134*  K 4.3 4.3 4.2 4.8 4.1  CL 104 102 103 105 103  CO2 GLUCOSE 177* 175* 192* 198* 175*  BUN CREATININE 0.69 0.66 0.64 0.74 0.71  CALCIUM 8.5* 8.4* 8.5* 8.3* 8.3*  MG 1.8 1.7 1.8 2.0 1.7  PHOS 3.7 4.1 4.4 4.5 4.4   Estimated Creatinine Clearance: 137.4 mL/min (by C-G formula based on SCr of 0.71 mg/dL). Liver & Pancreas: Recent Labs  Lab 11/04/22 1130 11/05/22 0405 11/07/22 0455 11/08/22 0800 11/09/22 0557  AST 14* ALT ALKPHOS 102 98 105 93 87  BILITOT 0.2* 0.3 0.6 0.5 0.5  PROT 5.6* 5.8* 6.1* 5.5* 5.6*  ALBUMIN 1.8* 1.9* 2.1* 1.9* 1.9*   No results for input(s): "LIPASE", "AMYLASE" in the last 168 hours. No results for input(s): "AMMONIA" in the last 168 hours. Diabetic: No results for input(s): "HGBA1C" in the last 72 hours. Recent Labs  Lab 11/08/22 2321 11/09/22 0308 11/09/22 0800 11/09/22 1238 11/09/22 1608  GLUCAP 159* 173* 147* 188* 224*   Cardiac  Enzymes: No results for input(s): "CKTOTAL", "CKMB", "CKMBINDEX", "TROPONINI" in the last 168 hours. No results for input(s): "PROBNP" in the last 8760 hours. Coagulation Profile: No results for input(s): "INR", "PROTIME" in the last 168 hours. Thyroid Function Tests: No results for input(s): "TSH", "T4TOTAL", "FREET4", "T3FREE", "THYROIDAB" in the last 72 hours. Lipid Profile: Recent Labs    11/07/22 0455  TRIG 71   Anemia Panel: No results for input(s): "VITAMINB12", "FOLATE", "FERRITIN", "TIBC", "IRON", "RETICCTPCT" in the last 72 hours. Urine analysis:    Component Value Date/Time   COLORURINE AMBER (A) 10/30/2022 0300   APPEARANCEUR TURBID (A) 10/30/2022 0300   LABSPEC 1.031 (H) 10/30/2022 0300   PHURINE 5.0 10/30/2022 0300   GLUCOSEU NEGATIVE 10/30/2022 0300   HGBUR NEGATIVE 10/30/2022 0300   BILIRUBINUR NEGATIVE 10/30/2022 0300   KETONESUR 5 (A) 10/30/2022 0300   PROTEINUR 30 (A) 10/30/2022 0300   UROBILINOGEN 0.2 02/27/2015 1733   NITRITE NEGATIVE 10/30/2022 0300   LEUKOCYTESUR NEGATIVE 10/30/2022 0300   Sepsis Labs: Invalid input(s): "PROCALCITONIN", "LACTICIDVEN"  Microbiology: Recent Results (from the past 240 hour(s))  Aerobic/Anaerobic Culture w Gram Stain (surgical/deep wound)     Status: None   Collection Time: 11/03/22  1:08 PM   Specimen: Abscess  Result Value Ref Range Status   Specimen Description ABSCESS  Final   Special Requests Normal  Final   Gram Stain   Final    ABUNDANT WBC PRESENT, PREDOMINANTLY PMN NO ORGANISMS SEEN    Culture   Final    No growth aerobically or anaerobically. Performed at [facility], [address] [place], [place] [address]    Report Status 11/08/2022 FINAL  Final    Radiology Studies: DG Abd 1 View  Result Date: 11/09/2022 CLINICAL DATA:  Pain EXAM: ABDOMEN - 1 VIEW COMPARISON:  11/05/2022. FINDINGS: Mild small bowel dilatation which is  a nonspecific finding. Retained oral contrast in colon. Bilateral  percutaneous lower abdominal drains. IMPRESSION: Mild nonspecific small bowel dilatation with interval improvement. Electronically Signed   By: Layla Maw M.D.   On: 11/09/2022 08:01      [employer] Teegarden T. Trevian Hayashida [facility]  If 7PM-7AM, please contact night-coverage [email or web address] 11/09/2022, 5:00 PM

## 2022-11-09 NOTE — Plan of Care (Signed)

## 2022-11-09 NOTE — Progress Notes (Signed)
ANTICOAGULATION CONSULT NOTE - Follow Up Consult  Pharmacy Consult for Lovenox Indication: VTE prophylaxis  Allergies  Allergen Reactions   Metoclopramide Other (See Comments)    Developed restless leg, akathisia type limb movements.    Codeine Itching and Rash   Hydrocodone Rash   Percocet [Oxycodone-Acetaminophen] Hives and Rash    Tolerates dilaudid Tolerates acetaminophen     Patient Measurements: Height: 5\' 8"  (172.7 cm) Weight: (!) 168.7 kg (371 lb 14.7 oz) IBW/kg (Calculated) : 63.9  Vital Signs: Temp: 98.3 F (36.8 C) (04/10 1239) Temp Source: Oral (04/10 1239) BP: 129/60 (04/10 1239) Pulse Rate: 94 (04/10 1239)  Labs: Recent Labs    11/07/22 0455 11/08/22 0800 11/09/22 0557 11/09/22 1339  HGB 8.9* 7.5* 7.4*  --   HCT 27.0* 22.7* 22.3*  --   PLT 489* 413* 363  --   HEPRLOWMOCWT  --   --   --  0.95  CREATININE 0.64 0.74 0.71  --     Estimated Creatinine Clearance: 137.4 mL/min (by C-G formula based on SCr of 0.71 mg/dL).   Medications:  Scheduled:   (feeding supplement) PROSource Plus  30 mL Oral BID BM   carvedilol  3.125 mg Oral BID WC   Chlorhexidine Gluconate Cloth  6 each Topical Daily   enoxaparin (LOVENOX) injection  120 mg Subcutaneous Q12H   feeding supplement  1 Container Oral Q24H   insulin aspart  0-20 Units Subcutaneous Q4H   insulin glargine-yfgn  10 Units Subcutaneous Daily   [START ON 11/11/2022] levothyroxine  40 mcg Intravenous Daily   pantoprazole (PROTONIX) IV  40 mg Intravenous Q24H   polyethylene glycol  17 g Oral Daily   QUEtiapine  200 mg Oral QHS   senna-docusate  1 tablet Oral QHS   sodium chloride flush  3 mL Intravenous Q12H   sodium chloride flush  5 mL Intracatheter Q8H   sodium chloride flush  5 mL Intracatheter Q8H   Infusions:   ampicillin-sulbactam (UNASYN) IV Stopped (11/09/22 1402)   doxycycline (VIBRAMYCIN) IV Stopped (11/09/22 1223)   TPN ADULT (ION) 90 mL/hr at 11/09/22 1550   TPN ADULT (ION)       Assessment: 52 yo F on Apixaban PTA for hx of PE.  Pt diagnosed with PE October 2020 and completed full treatment dose for PE.  In March 2024, her dose was reduced to Apixaban 2.5mg  BID For long term VTE prophylaxis.  When patient was initially started on Lovenox, she was given a weight adjusted treatment dose at 120mg  SQ q12h, as evidenced by a therapeutic level 0.95 (goal 0.6-1 for treatment).  I reached out to Dr. Alanda Slim and we discussed reducing patient to VTE prophylaxis dosing as was the indication PTA.  Goal of Therapy:  Anti-Xa level 0.3-0.6 units/ml 4hrs after LMWH dose given (for prophylaxis) Monitor platelets by anticoagulation protocol: Yes   Plan:  Change Lovenox dose to 80mg  SQ q24h for VTE prophylaxis.   Next dose due 4/11 at 10am.  [facility], Pharm.D., BCPS Clinical Pharmacist  **Pharmacist phone directory can be found on [email or web address] listed under [facility].  11/09/2022 4:15 PM

## 2022-11-09 NOTE — Progress Notes (Signed)
Subjective: Patient reports + flatus, had BM.  Sore from drains, but overall feeling better.  Still with Nausea, not wanting to eat.      Objective: I have reviewed patient's vital signs, intake and output, medications, labs, and radiology results.  General: alert and no distress Resp: clear to auscultation bilaterally Cardio: regular rate and rhythm GI: soft, non-tender; bowel sounds normal; no masses,  no organomegaly Extremities: extremities normal, atraumatic, no cyanosis or edema  Drains w small output.    Assessment/Plan: Reviewed [place], CT and clinical picture with radiology.  L adnexal collection seems like TOA, but given clinical info of not being sexually active, makes less likely.  Also R fluid collection is distant from adnexa. Bowels not definitively involved, appendix not clearly visualized. [place] did not identify ovaries.  Unasyn and Doxycycline for antibiotic coverage Will be available - [phone] with gyn questions At discharge plan for Yearly exam at gyn   LOS: 16 days    Sherian Rein, MD 11/09/2022, 12:23 PM

## 2022-11-09 NOTE — Progress Notes (Addendum)
PHARMACY - TOTAL PARENTERAL NUTRITION CONSULT NOTE   Indication: Small bowel obstruction and prolonged inability to tolerate PO intake  Patient Measurements: Height: 5\' 8"  (172.7 cm) Weight: (!) 168.7 kg (371 lb 14.7 oz) IBW/kg (Calculated) : 63.9 TPN AdjBW (KG): 86.4 Body mass index is 56.55 kg/m.  Assessment:  52 yo female with PMH of Bell's palsy, DM, CHF, hypothyroidism, hx DVT on Eliquis. Recently discharged on 3/21 after admission for abdominal pain and intractable nausea/vomiting. Brought back to ED on 3/24 for AMS and loss of consciousness. Endorsed abdominal pain and nausea/vomiting on day of admission. Pharmacy has been consulted on 4/3 to begin TPN as patient now with evidence of SBO on imaging and prolonged poor PO intake.   Discussed with patient on 4/3, she states that she really has not been able to tolerate a meal since before her previous admission ~3/16. Has a few juice cups and water cup on her tray, but states she is really only able to drink liquids when she is taking meds. Intake causes her to experience nausea and abdominal pain. Patient stated that just thinking about food/liquid/PO intake during our discussion made her feel nauseous.   Patient now with BM overnight, diet increased to full liquids.   Glucose / Insulin: hx T2DM, last A1c 7.4% 05/2022, on U-500 100 units TID, metformin, Jardiance, and Ozempic PTA >> was started on Semglee 100 units BID here but reduced down to Semglee 10 units QD on 4/1 for low BG's secondary to poor PO intake.  - CBGs up to 147-170 - 23 units of SSI/24h while on rSSI, 10u insulin in TPN  Electrolytes: Na:134 (increased within TPN on 4/9), Mg 1.7, Phos: 4.4 (decrease within TPN 4/9), K: 4.1 (small decrease within TPN 4/9),  all others WNL and stable - Goal K >/= 4 and Mag >/= 2 Renal: Scr <1, BUN 11 Hepatic: 4/10: LFTs wnl, 4/8: Tgs 71 Intake / Output: UOP 0.60mL/kg/hr, +11L this admission, LBM 4/9, drain output 53 mL MIVF: none GI  Imaging: - 3/24 CTAP: inflammatory/infectious process such as PID w/ developing abscess, no bowel obstruction - 3/29 Abd X-ray: multiple dilated air-filled loops of small and large bowel, could represent ileus or developing obstruction - 4/2 CTAP: new loculated rim enhancing fluid collection w/in the RLQ, several loops of distal small bowel, developing mid small bowel obstruction - 4/5 Abd x-ray: persistent dilated loops of small bowel which may reflect ileus or pSBO - 4/6 Abd Xray: no significant change in small bowel dilation GI Surgeries / Procedures:  - 3/26: CT-guided left pelvic abscess drain placement  Central access: 4/3 TPN start date: 4/3  Nutritional Goals: Goal TPN rate is 90 mL/hr (provides 112 g of protein and 1988 kcals per day)  RD Assessment: Estimated Needs Total Energy Estimated Needs: 1900-2100 kcal/d Total Protein Estimated Needs: 100-120 g/d Total Fluid Estimated Needs: 2L/d  Current Nutrition:  TPN Full liquids started 4/8 - spoke with patient on 4/9, she has mostly only been able to keep down liquids (cranberry juice), she said she tried pudding last night but got nauseous and was unable to finish, no breakfast eaten this AM, will not be weaning until can consistently tolerate full liquid diet    Plan:  Continue TPN at 28mL/hr at 1800 providing 112g AA and 1988 kcal meeting 100% of nutritional needs.  Electrolytes in TPN: Na to 151mEq/L, K 31mEq/L, Ca 25mEq/L, increase Mg 10 mEq/L, and Phos 65mmol/L. Cl:Ac 1:1.  Will give 2g IV magnesium for goal >  2.  Will trend electrolytes for tomorrow to adjust K, Phos, and Na as several adjustments were made 4/9.  Add standard MVI and trace elements to TPN Continue resistant q4h SSI and adjust as needed.  Continue 10 u of insulin into TPN.  Monitor TPN labs on Mon/Thurs and PRN   F/u ability to advance diet  Estill Batten, PharmD, BCCCP  11/09/2022 7:09 AM

## 2022-11-10 DIAGNOSIS — R9431 Abnormal electrocardiogram [ECG] [EKG]: Secondary | ICD-10-CM | POA: Diagnosis not present

## 2022-11-10 DIAGNOSIS — G9340 Encephalopathy, unspecified: Secondary | ICD-10-CM | POA: Diagnosis not present

## 2022-11-10 DIAGNOSIS — G9341 Metabolic encephalopathy: Secondary | ICD-10-CM

## 2022-11-10 DIAGNOSIS — N730 Acute parametritis and pelvic cellulitis: Secondary | ICD-10-CM | POA: Diagnosis not present

## 2022-11-10 LAB — TRIGLYCERIDES: Triglycerides: 71 mg/dL (ref <150)

## 2022-11-10 LAB — BPAM RBC: Blood Product Expiration Date: 202404162359

## 2022-11-10 LAB — CBC
HCT: 22.6 % — ABNORMAL LOW (ref 36.0–46.0)
Hemoglobin: 7.3 g/dL — ABNORMAL LOW (ref 12.0–15.0)
MCH: 28.9 pg (ref 26.0–34.0)
MCHC: 32.3 g/dL (ref 30.0–36.0)
MCV: 89.3 fL (ref 80.0–100.0)
Platelets: 316 10*3/uL (ref 150–400)
RBC: 2.53 MIL/uL — ABNORMAL LOW (ref 3.87–5.11)
RDW: 16 % — ABNORMAL HIGH (ref 11.5–15.5)
WBC: 9.7 10*3/uL (ref 4.0–10.5)
nRBC: 0 % (ref 0.0–0.2)

## 2022-11-10 LAB — IRON AND TIBC
Iron: 36 ug/dL (ref 28–170)
Saturation Ratios: 16 % (ref 10.4–31.8)
TIBC: 231 ug/dL — ABNORMAL LOW (ref 250–450)
UIBC: 195 ug/dL

## 2022-11-10 LAB — TYPE AND SCREEN
ABO/RH(D): O POS
Antibody Screen: NEGATIVE

## 2022-11-10 LAB — GLUCOSE, CAPILLARY
Glucose-Capillary: 171 mg/dL — ABNORMAL HIGH (ref 70–99)
Glucose-Capillary: 177 mg/dL — ABNORMAL HIGH (ref 70–99)
Glucose-Capillary: 178 mg/dL — ABNORMAL HIGH (ref 70–99)
Glucose-Capillary: 188 mg/dL — ABNORMAL HIGH (ref 70–99)
Glucose-Capillary: 195 mg/dL — ABNORMAL HIGH (ref 70–99)
Glucose-Capillary: 226 mg/dL — ABNORMAL HIGH (ref 70–99)

## 2022-11-10 LAB — RETICULOCYTES
Immature Retic Fract: 25.5 % — ABNORMAL HIGH (ref 2.3–15.9)
RBC.: 2.51 MIL/uL — ABNORMAL LOW (ref 3.87–5.11)
Retic Count, Absolute: 136.5 10*3/uL (ref 19.0–186.0)
Retic Ct Pct: 5.4 % — ABNORMAL HIGH (ref 0.4–3.1)

## 2022-11-10 LAB — PREPARE RBC (CROSSMATCH)

## 2022-11-10 LAB — FERRITIN: Ferritin: 180 ng/mL (ref 11–307)

## 2022-11-10 LAB — COMPREHENSIVE METABOLIC PANEL
ALT: 15 U/L (ref 0–44)
AST: 16 U/L (ref 15–41)
Albumin: 1.8 g/dL — ABNORMAL LOW (ref 3.5–5.0)
Alkaline Phosphatase: 79 U/L (ref 38–126)
Anion gap: 8 (ref 5–15)
BUN: 11 mg/dL (ref 6–20)
CO2: 25 mmol/L (ref 22–32)
Calcium: 8.3 mg/dL — ABNORMAL LOW (ref 8.9–10.3)
Chloride: 102 mmol/L (ref 98–111)
Creatinine, Ser: 0.68 mg/dL (ref 0.44–1.00)
GFR, Estimated: >60 mL/min (ref >60)
Glucose, Bld: 187 mg/dL — ABNORMAL HIGH (ref 70–99)
Potassium: 4 mmol/L (ref 3.5–5.1)
Sodium: 135 mmol/L (ref 135–145)
Total Bilirubin: 0.5 mg/dL (ref 0.3–1.2)
Total Protein: 5.4 g/dL — ABNORMAL LOW (ref 6.5–8.1)

## 2022-11-10 LAB — FOLATE: Folate: 5 ng/mL — ABNORMAL LOW (ref >5.9)

## 2022-11-10 LAB — HEMOGLOBIN A1C
Hgb A1c MFr Bld: 7.4 % — ABNORMAL HIGH (ref 4.8–5.6)
Mean Plasma Glucose: 165.68 mg/dL

## 2022-11-10 LAB — PHOSPHORUS: Phosphorus: 4.1 mg/dL (ref 2.5–4.6)

## 2022-11-10 LAB — VITAMIN B12: Vitamin B-12: 465 pg/mL (ref 180–914)

## 2022-11-10 LAB — MAGNESIUM: Magnesium: 1.7 mg/dL (ref 1.7–2.4)

## 2022-11-10 MED ORDER — LEVOTHYROXINE SODIUM 50 MCG PO TABS
50.0000 ug | ORAL_TABLET | Freq: Every day | ORAL | Status: DC
  Administered 2022-11-10 – 2022-11-19 (×10): 50 ug via ORAL
  Filled 2022-11-10 (×10): qty 1

## 2022-11-10 MED ORDER — INSULIN GLARGINE-YFGN 100 UNIT/ML ~~LOC~~ SOLN
15.0000 [IU] | Freq: Every day | SUBCUTANEOUS | Status: DC
  Administered 2022-11-11 – 2022-11-19 (×9): 15 [IU] via SUBCUTANEOUS
  Filled 2022-11-10 (×9): qty 0.15

## 2022-11-10 MED ORDER — TRAVASOL 10 % IV SOLN
INTRAVENOUS | Status: AC
  Filled 2022-11-10: qty 1123.2

## 2022-11-10 MED ORDER — FOLIC ACID 1 MG PO TABS
1.0000 mg | ORAL_TABLET | Freq: Every day | ORAL | Status: DC
  Administered 2022-11-10 – 2022-11-19 (×10): 1 mg via ORAL
  Filled 2022-11-10 (×10): qty 1

## 2022-11-10 MED ORDER — SODIUM CHLORIDE 0.9% IV SOLUTION: Freq: Once | INTRAVENOUS | Status: AC

## 2022-11-10 MED ORDER — MAGNESIUM SULFATE 2 GM/50ML IV SOLN
2.0000 g | Freq: Once | INTRAVENOUS | Status: AC
  Administered 2022-11-10: 2 g via INTRAVENOUS
  Filled 2022-11-10: qty 50

## 2022-11-10 NOTE — Progress Notes (Signed)
Physical Therapy Treatment Patient Details Name: [name] MRN: [id number] DOB: 1971/04/10 Today's Date: 11/10/2022   History of Present Illness Pt is a 52 y.o. F who presents 10/23/2022 with acute metabolic encephalopathy and severe sepsis secondary to intra-abdominal abscess. S/p drain placement 3/26. 3/30 x-ray showing SBO vs ileus. Significant PMH:  morbid obesity with BMI greater than 50, systolic/diastolic CHF with previous ejection fraction of 30 to 35% and grade 1 diastolic dysfunction, chronic right ventricular failure, history of DVT, hypothyroidism, depression and anxiety and chronic abdominal pain.    PT Comments    Pt initially resistant to getting up and does not want to get into chair because she reports increased abdominal pain there. Discussed the need for more mobility as well as sitting up and pt agreeable when she began to have to go to the bathroom.  Pt able to come to EOB with supervision but increased time needed as well as use of rail. Pt ambulated 12' with RW. Needed min A +2 for ambulation as well as standing back up from low toilet. Pt agreeable to chair with feet down and table in front for support of elbows. Discussed her getting into chair 3-5 x/ day but remaining less than an hour each time and she feels that she can handle this. PT will continue to follow.    Recommendations for follow up therapy are one component of a multi-disciplinary discharge planning process, led by the attending physician.  Recommendations may be updated based on patient status, additional functional criteria and insurance authorization.  Follow Up Recommendations  Can patient physically be transported by private vehicle: No    Assistance Recommended at Discharge PRN  Patient can return home with the following Two people to help with walking and/or transfers;Two people to help with bathing/dressing/bathroom;Assistance with cooking/housework;Assist for transportation;Help with stairs or  ramp for entrance   Equipment Recommendations  Hospital bed    Recommendations for Other Services       Precautions / Restrictions Precautions Precautions: Fall;Other (comment) Precaution Comments: bilateral drains Restrictions Weight Bearing Restrictions: No     Mobility  Bed Mobility Overal bed mobility: Needs Assistance Bed Mobility: Supine to Sit     Supine to sit: HOB elevated, Min guard     General bed mobility comments: increased time needed    Transfers Overall transfer level: Needs assistance Equipment used: Rolling walker (2 wheels) Transfers: Sit to/from Stand Sit to Stand: Min assist, +2 physical assistance           General transfer comment: from bed and toilet. Great effort needed to get up from low toilet, bar used as well as stabiized RW    Ambulation/Gait Ambulation/Gait assistance: Min assist, +2 safety/equipment Gait Distance (Feet): 12 Feet (2x) Assistive device: Rolling walker (2 wheels) (bari) Gait Pattern/deviations: Decreased stride length, Trunk flexed, Wide base of support Gait velocity: reduced Gait velocity interpretation: <1.31 ft/sec, indicative of household ambulator   General Gait Details: no knee instability noted today, pt reports she feels she is getting stronger and endorses exercising in bed   Stairs             Wheelchair Mobility    Modified Rankin (Stroke Patients Only)       Balance Overall balance assessment: Needs assistance Sitting-balance support: Feet supported, Single extremity supported Sitting balance-Leahy Scale: Fair Sitting balance - Comments: Prefers UE support   Standing balance support: During functional activity, Bilateral upper extremity supported Standing balance-Leahy Scale: Poor Standing balance comment: reliant on  Bil UE support                            Cognition Arousal/Alertness: Awake/alert Behavior During Therapy: WFL for tasks assessed/performed Overall  Cognitive Status: Within Functional Limits for tasks assessed                                          Exercises      General Comments General comments (skin integrity, edema, etc.): VSS, HR to low 100's with activity. Pt reports her abdomen hurts more in recliner. Positioned her with feet down and table in front that she could put her elbows on. Instructed her to get to chair more frequently in day for only 30-45 mins at a time instead of avoiding chair entirely      Pertinent Vitals/Pain Pain Assessment Pain Assessment: Faces Faces Pain Scale: Hurts even more Pain Location: drain location, abdomen Pain Descriptors / Indicators: Tightness, Guarding, Discomfort, Grimacing, Sore Pain Intervention(s): Limited activity within patient's tolerance, Monitored during session    Home Living                          Prior Function            PT Goals (current goals can now be found in the care plan section) Acute Rehab PT Goals Patient Stated Goal: less pain PT Goal Formulation: With patient Time For Goal Achievement: 11/15/22 Potential to Achieve Goals: Fair Progress towards PT goals: Progressing toward goals    Frequency    Min 3X/week      PT Plan Current plan remains appropriate    Co-evaluation PT/OT/SLP Co-Evaluation/Treatment: Yes Reason for Co-Treatment: For patient/therapist safety PT goals addressed during session: Mobility/safety with mobility;Balance;Proper use of DME;Strengthening/ROM        AM-PAC PT "6 Clicks" Mobility   Outcome Measure  Help needed turning from your back to your side while in a flat bed without using bedrails?: A Little Help needed moving from lying on your back to sitting on the side of a flat bed without using bedrails?: A Little Help needed moving to and from a bed to a chair (including a wheelchair)?: A Little Help needed standing up from a chair using your arms (e.g., wheelchair or bedside chair)?: A  Little Help needed to walk in hospital room?: Total Help needed climbing 3-5 steps with a railing? : A Lot 6 Click Score: 15    End of Session Equipment Utilized During Treatment: Gait belt Activity Tolerance: Patient tolerated treatment well Patient left: in chair;with call bell/phone within reach Nurse Communication: Other (comment);Mobility status (new purewick needed) PT Visit Diagnosis: Muscle weakness (generalized) (M62.81);Difficulty in walking, not elsewhere classified (R26.2);Pain;Unsteadiness on feet (R26.81);Other abnormalities of gait and mobility (R26.89) Pain - part of body:  (abdomen)     Time: 6237-6283 PT Time Calculation (min) (ACUTE ONLY): 26 min  Charges:  $Gait Training: 8-22 mins                     Lyanne Co, PT  Acute Rehab Services Secure chat preferred Office [phone]    Benetta Spar L Azreal Stthomas 11/10/2022, 11:05 AM

## 2022-11-10 NOTE — Progress Notes (Addendum)
PHARMACY - TOTAL PARENTERAL NUTRITION CONSULT NOTE   Indication: Small bowel obstruction and prolonged inability to tolerate PO intake  Patient Measurements: Height: 5\' 8"  (172.7 cm) Weight: (!) 167.6 kg (369 lb 7.9 oz) IBW/kg (Calculated) : 63.9 TPN AdjBW (KG): 86.4 Body mass index is 56.18 kg/m.  Assessment:  52 yo female with PMH of Bell's palsy, DM, CHF, hypothyroidism, hx DVT on Eliquis. Recently discharged on 3/21 after admission for abdominal pain and intractable nausea/vomiting. Brought back to ED on 3/24 for AMS and loss of consciousness. Endorsed abdominal pain and nausea/vomiting on day of admission. Pharmacy has been consulted on 4/3 to begin TPN as patient now with evidence of SBO on imaging and prolonged poor PO intake.   Discussed with patient on 4/3, she states that she really has not been able to tolerate a meal since before her previous admission ~3/16. Has a few juice cups and water cup on her tray, but states she is really only able to drink liquids when she is taking meds. Intake causes her to experience nausea and abdominal pain. Patient stated that just thinking about food/liquid/PO intake during our discussion made her feel nauseous.   Patient now with BM overnight, diet increased to full liquids.   Glucose / Insulin: hx T2DM, last A1c 7.4% 05/2022, on U-500 100 units TID, metformin, Jardiance, and Ozempic PTA >> was started on Semglee 100 units BID here but reduced down to Semglee 10 units QD on 4/1 for low BG's secondary to poor PO intake.  - CBGs up to 161 - 224, 22 units of SSI/24h while on rSSI, 10u insulin in TPN  Electrolytes: Na: 135, Mg 1.7 (increased in TPN on 4/10), Phos: 4.1, K: 4.0, all others WNL and stable - Goal K >/= 4 and Mag >/= 2 Renal: Scr <1, BUN 11 Hepatic: 4/11: LFTs wnl, 4/11: TG: 71  Intake / Output: UOP 0.70mL/kg/hr, +12L this admission still holding home diuretics, normal volume status, LBM 4/10, drain output 36 mL, PO intake  MIVF:  none GI Imaging: - 3/24 CTAP: inflammatory/infectious process such as PID w/ developing abscess, no bowel obstruction - 3/29 Abd X-ray: multiple dilated air-filled loops of small and large bowel, could represent ileus or developing obstruction - 4/2 CTAP: new loculated rim enhancing fluid collection w/in the RLQ, several loops of distal small bowel, developing mid small bowel obstruction - 4/5 Abd x-ray: persistent dilated loops of small bowel which may reflect ileus or pSBO - 4/6 Abd Xray: no significant change in small bowel dilation GI Surgeries / Procedures:  - 3/26: CT-guided left pelvic abscess drain placement  Central access: 4/3 TPN start date: 4/3  Nutritional Goals: Goal TPN rate is 90 mL/hr (provides 112 g of protein and 1988 kcals per day)  RD Assessment: Estimated Needs Total Energy Estimated Needs: 1900-2100 kcal/d Total Protein Estimated Needs: 100-120 g/d Total Fluid Estimated Needs: 2L/d  Current Nutrition:  TPN Full liquids started 4/8 -  patient still with very low PO intake, she reports some anxiety around trying more solid foods. Encouraged patient to try some more PO intake today and see how it goes.   Plan:  Continue TPN at 36mL/hr at 1800 providing 112g AA and 1988 kcal meeting 100% of nutritional needs.  Electrolytes in TPN: Na to 152mEq/L, K 15mEq/L, Ca 49mEq/L, cont Mg 10 mEq/L (increased on 4/10), and Phos 5mmol/L. Cl:Ac 1:1.  Will give 2g IV magnesium for goal > 2. Trend for full 24 hours on increased Mag content  from 4/10. Will consider further increase on 4/12.  Add standard MVI and trace elements to TPN Continue resistant q4h SSI and adjust as needed.  Increase to 15 u of insulin in TPN.  Monitor TPN labs on Mon/Thurs and PRN   F/u ability to advance diet  Estill Batten, PharmD, [facility]  11/10/2022 7:36 AM

## 2022-11-10 NOTE — Progress Notes (Signed)
Occupational Therapy Treatment Patient Details Name: [name] MRN: [id number] DOB: Oct 01, 1970 Today's Date: 11/10/2022   History of present illness Pt is a 52 y.o. F who presents 10/23/2022 with acute metabolic encephalopathy and severe sepsis secondary to intra-abdominal abscess. S/p drain placement 3/26. 3/30 x-ray showing SBO vs ileus. Significant PMH:  morbid obesity with BMI greater than 50, systolic/diastolic CHF with previous ejection fraction of 30 to 35% and grade 1 diastolic dysfunction, chronic right ventricular failure, history of DVT, hypothyroidism, depression and anxiety and chronic abdominal pain.   OT comments  Upon entry, pt not ready to mobilize due to being cold; agreeable with warm blanket wrapped around shoulders. Pt performing ambulatory transfer with min A +2 for safety to restroom. Able to perform anterior pericare with set-up A. Pt with heavy use of rail and needed stabilization of RW to come up from low toilet surface. Pt continues to progress toward established OT goals. Will continue to follow.    Recommendations for follow up therapy are one component of a multi-disciplinary discharge planning process, led by the attending physician.  Recommendations may be updated based on patient status, additional functional criteria and insurance authorization.    Assistance Recommended at Discharge Frequent or constant Supervision/Assistance  Patient can return home with the following  A lot of help with walking and/or transfers;A lot of help with bathing/dressing/bathroom;Assistance with cooking/housework;Assist for transportation;Help with stairs or ramp for entrance;Direct supervision/assist for financial management;Direct supervision/assist for medications management   Equipment Recommendations  Other (comment) (defer next venue of care)    Recommendations for Other Services      Precautions / Restrictions Precautions Precautions: Fall;Other (comment) Precaution  Comments: bilateral drains Restrictions Weight Bearing Restrictions: No       Mobility Bed Mobility Overal bed mobility: Needs Assistance Bed Mobility: Supine to Sit     Supine to sit: HOB elevated, Min guard     General bed mobility comments: increased time needed    Transfers Overall transfer level: Needs assistance Equipment used: Rolling walker (2 wheels) Transfers: Sit to/from Stand Sit to Stand: Min assist, +2 physical assistance           General transfer comment: from bed and toilet. Great effort needed to get up from low toilet, bar used as well as stabiized RW     Balance Overall balance assessment: Needs assistance Sitting-balance support: Feet supported, Single extremity supported Sitting balance-Leahy Scale: Fair Sitting balance - Comments: Prefers UE support   Standing balance support: During functional activity, Bilateral upper extremity supported Standing balance-Leahy Scale: Poor Standing balance comment: reliant on Bil UE support                           ADL either performed or assessed with clinical judgement   ADL Overall ADL's : Needs assistance/impaired                         Toilet Transfer: Minimal assistance;+2 for safety/equipment;Rolling walker (2 wheels);Ambulation Toilet Transfer Details (indicate cue type and reason): ambulated to restoom in room with min A +2 for safety Toileting- Clothing Manipulation and Hygiene: Set up;Sitting/lateral lean       Functional mobility during ADLs: Minimal assistance;+2 for safety/equipment;Rolling walker (2 wheels) General ADL Comments: session focused on functional mobility and toileting    Extremity/Trunk Assessment Upper Extremity Assessment Upper Extremity Assessment: Generalized weakness   Lower Extremity Assessment Lower Extremity Assessment: Defer to PT  evaluation        Vision       Perception     Praxis      Cognition Arousal/Alertness:  Awake/alert Behavior During Therapy: WFL for tasks assessed/performed Overall Cognitive Status: Within Functional Limits for tasks assessed                                          Exercises      Shoulder Instructions       General Comments VSS, HR to low 100's with activity. Pt reports her abdomen hurts more in recliner. Positioned her with feet down and table in front that she could put her elbows on. Instructed her to get to chair more frequently in day for only 30-45 mins at a time instead of avoiding chair entirely    Pertinent Vitals/ Pain       Pain Assessment Pain Assessment: Faces Faces Pain Scale: Hurts even more Pain Location: drain location, abdomen Pain Descriptors / Indicators: Tightness, Guarding, Discomfort, Grimacing, Sore Pain Intervention(s): Limited activity within patient's tolerance, Monitored during session  Home Living                                          Prior Functioning/Environment              Frequency  Min 2X/week        Progress Toward Goals  OT Goals(current goals can now be found in the care plan section)  Progress towards OT goals: Progressing toward goals  Acute Rehab OT Goals Patient Stated Goal: none stated OT Goal Formulation: With patient Time For Goal Achievement: 11/15/22 Potential to Achieve Goals: Fair ADL Goals Pt Will Perform Grooming: sitting;with modified independence Pt Will Perform Lower Body Dressing: with min assist;with adaptive equipment;sit to/from stand Pt Will Transfer to Toilet: ambulating;bedside commode;with min assist Pt/caregiver will Perform Home Exercise Program: Increased strength;Both right and left upper extremity;With written HEP provided;With theraband Additional ADL Goal #1: Pt will complete bed mobility with min assist as precursor to ADLs.  Plan Discharge plan remains appropriate    Co-evaluation    PT/OT/SLP Co-Evaluation/Treatment: Yes Reason for  Co-Treatment: For patient/therapist safety PT goals addressed during session: Mobility/safety with mobility;Balance;Proper use of DME;Strengthening/ROM OT goals addressed during session: ADL's and self-care      AM-PAC OT "6 Clicks" Daily Activity     Outcome Measure   Help from another person eating meals?: None Help from another person taking care of personal grooming?: A Little Help from another person toileting, which includes using toliet, bedpan, or urinal?: A Little Help from another person bathing (including washing, rinsing, drying)?: A Lot Help from another person to put on and taking off regular upper body clothing?: A Little Help from another person to put on and taking off regular lower body clothing?: A Lot 6 Click Score: 17    End of Session Equipment Utilized During Treatment: Gait belt;Rolling walker (2 wheels)  OT Visit Diagnosis: Other abnormalities of gait and mobility (R26.89);Pain;Muscle weakness (generalized) (M62.81) Pain - Right/Left: Right Pain - part of body:  (abdomen)   Activity Tolerance Patient tolerated treatment well   Patient Left in chair;with call bell/phone within reach;with chair alarm set   Nurse Communication Mobility status  Time: 8250-5397 OT Time Calculation (min): 29 min  Charges: OT General Charges $OT Visit: 1 Visit OT Treatments $Self Care/Home Management : 8-22 mins  Tyler Deis, OTR/L [facility] Office: [phone]   [name] 11/10/2022, 2:29 PM

## 2022-11-10 NOTE — Progress Notes (Signed)
PROGRESS NOTE  [name] HOGLE ZOX:[id number] DOB: 13-Dec-1970   PCP: Verlon Au, MD  Patient is from: Home.  DOA: 10/23/2022 LOS: 16  Chief complaints Chief Complaint  Patient presents with   Code Stroke     Brief Narrative / Interim history: 52 year old F with PMH of morbid obesity, combined CHF, RV failure, DVT/PE on prophylactic dose Eliquis, hypothyroidism, anxiety and depression presenting with lightheadedness, abdominal pain and confusion, and admitted for severe sepsis with acute encephalopathy in the setting of intra-abdominal abscess concerning for PID/TOA.  Patient was started on antibiotics.  Gynecology, general surgery and interventional radiology consulted.  Patient had left pelvic drain placed on 3/26.  Fluid culture with Streptococcus anginosus.  Mentation improved.  She developed nausea and vomiting.  Concerned about ileus/SBO on KUB on 3/30.  NG tube placed for SBO protocol but patient was not able to tolerate.  PICC line placed and started on TPN on 4/2.  Repeat CT abdomen and pelvis on 4/2 with complete evacuation of left adnexal fluid collection but persistent poorly loculated fluid within the right adnexa and new loculated rim-enhancing fluid collection within RLQ measuring 3.2 x 7.4 x 5.0 cm, and developing mid SBO with single-point transition.  Patient had RLQ drain placement on 4/4.  Gyn recommended TVUS and doxycycline to treat for possible PID.  TVUS showed small amount of free fluid in pelvis and the ovaries were not visualized.  ID recommended continuing IV Unasyn and transition to p.o. Augmentin when able to tolerate p.o.  Currently on TPN, FLD, IV ampicillin and doxycycline   Subjective: Seen and examined earlier this morning.  No major events overnight of this morning.  Reports improvement in abdominal pain.  Denies nausea or vomiting.  Hemoglobin slowly drifting down.  Remains on full liquid diet.  Very anxious about trying solid.  She likes to try some  grits today.  Drain output about 36 cc.   Objective: Vitals:   11/09/22 0312 11/09/22 0500 11/09/22 0803 11/09/22 1239  BP: 127/60  122/61 129/60  Pulse: 90  80 94  Resp: 14  11 20   Temp: 98.2 F (36.8 C)  98 F (36.7 C) 98.3 F (36.8 C)  TempSrc: Oral  Oral Oral  SpO2: 98%  98% 98%  Weight:  (!) 168.7 kg    Height:        Examination:  GENERAL: No apparent distress.  Nontoxic. HEENT: MMM.  Vision and hearing grossly intact.  NECK: Supple.  No apparent JVD.  RESP:  No IWOB.  Fair aeration bilaterally but limited exam due to body habitus. CVS:  RRR. Heart sounds normal.  ABD/GI/GU: BS+. Abd soft.  Mild tenderness across lower abdomen.  Drain to LLQ.  JP drain to RLQ. MSK/EXT:   No apparent deformity. Moves extremities. No edema.  SKIN: no apparent skin lesion or wound NEURO: Awake and alert. Oriented appropriately.  No apparent focal neuro deficit. PSYCH: Calm. Normal affect.   Procedures:  3/26-CT-guided left pelvic drain 4/4-CT-guided RLQ drain  Microbiology summarized: 3/24-wet prep negative 3/26-drain culture with Streptococcus anginosus 3/26-blood cultures NGTD 3/31-urine culture with insignificant growth 4/4-drain culture negative.  Assessment and plan: Principal Problem:   Acute encephalopathy Active Problems:   Hypertension complicating diabetes   Chronic systolic CHF (congestive heart failure)   SJOGREN'S SYNDROME   QT prolongation   Type 2 diabetes mellitus with hyperlipidemia   History of CVA (cerebrovascular accident)   Major depressive disorder with single episode, in partial remission   SLE (systemic  lupus erythematosus related syndrome)   PID (acute pelvic inflammatory disease)  Severe sepsis secondary to intra-abdominal abscess concerning for PID/TOA: Met criteria for severe sepsis with fever, leukocytosis, AKI and encephalopathy. -Imagings as above. -S/p LLQ drain placement on 3/26 and RLQ drain placement on 4/4 -Culture data as above.  -On  IV Unasyn and doxycycline for a total of 14 days -Per IR, repeat CT once drain output minimal.  Drain output 36 cc over the last 24 hours.  Acute metabolic encephalopathy: Likely due to the above.  Other encephalopathy workup including ammonia, TSH, HIV, RPR, EEG, CT head and VBG without significant finding.  Encephalopathy resolved. -Reorientation and delirium precautions -Minimize or avoid sedating medications  Persistent ileus versus partial SBO: Slowly improving. -Continue TPN -SLP recommended dysphagia 3 diet but patient is anxious to try solid food yet.  Encouraged patient to try solid. -PT/OT for mobility  Chronic combined CHF with recovered EF: TTE on 3/25 with LVEF of 55 to 60% (30 to 35% in 2022), indeterminate DD and normal RV.  Difficult to assess fluid status due to body habitus.  No significant cardiopulmonary symptoms.  On torsemide 20 mg daily and Jardiance 10 mg daily at home. -Continue monitoring fluid and respiratory status -Continue holding home torsemide and Jardiance.  Normocytic anemia: Hgb drifting down after initial improvement.  No overt bleeding.  Anemia panel with mild folic acid deficiency Recent Labs    11/01/22 1146 11/02/22 1053 11/03/22 1700 11/04/22 0755 11/05/22 0405 11/06/22 1123 11/07/22 0455 11/08/22 0800 11/09/22 0557 11/10/22 0617  HGB 8.0* 7.9* 7.4* 7.2* 7.4* 7.6* 8.9* 7.5* 7.4* 7.3*  -Transfuse 1 unit -Folic acid -Monitor H&H  Uncontrolled IDDM-2 with hyperglycemia: A1c 7.4% Recent Labs  Lab 11/08/22 2321 11/09/22 0308 11/09/22 0800 11/09/22 1238 11/09/22 1608  GLUCAP 159* 173* 147* 188* 224*  -Increase Semglee from 10 to 15 units daily starting tomorrow -Continue SSI-resistant scale -Further adjustment as appropriate.  History of PE/DVT: On low-dose Eliquis at home. -Continue prophylactic dose Lovenox.  Lupus and Sjogren's Syndrome: On Plaquenil, Benlysta and Humira. -Holding home meds in the setting of active  infection  Syncope?  Due to #1.  TTE reassuring. -Treat infection as above  UTI ruled out.   AKI: Resolved.   Gastroparesis?  Intolerant to Reglan. -Continue Zofran.   Hypokalemia/hypomagnesemia -Monitor replenish as appropriate   Mood disorder: Somewhat anxious.  Pharmacy not able to verify home meds. -On Seroquel 200 mg at night.  Seems to be on 300 mg at home -Xanax and Wellbutrin discontinued early in the course   Hypothyroidism -Continue home Synthroid   Overactive bladder: Seems to be stable off home oxybutynin.   GERD -Continue PPI   Diabetic Neuropathy: Lyrica discontinued due to encephalopathy.  Stable.   Hyperlipidemia. -Holding Crestor until off of TPN    Oral candidiasis. -Completed a week of Fluconazole.   Morbid obesity with significant comorbidity as above Body mass index is 56.55 kg/m.  Inadequate oral intake.  Severe malnutrition ruled out Nutrition Problem: Inadequate oral intake Etiology: poor appetite, nausea Signs/Symptoms: meal completion < 25%, NPO status, per patient/family report Interventions: Refer to RD note for recommendations   DVT prophylaxis:  Place and maintain sequential compression device Start: 10/25/22 1103  Code Status: Full code Family Communication: None at bedside Level of care: Telemetry Medical Status is: Inpatient Remains inpatient appropriate because: Intra-abdominal infection/abscess   Final disposition: TBD Consultants:  General surgery Interventional radiology Gynecology Infectious disease  55 minutes with more than 50% spent  in reviewing records, counseling patient/family and coordinating care.   Sch Meds:  Scheduled Meds:  (feeding supplement) PROSource Plus  30 mL Oral BID BM   carvedilol  3.125 mg Oral BID WC   Chlorhexidine Gluconate Cloth  6 each Topical Daily   [START ON 11/10/2022] enoxaparin (LOVENOX) injection  80 mg Subcutaneous Q24H   feeding supplement  1 Container Oral Q24H   insulin  aspart  0-20 Units Subcutaneous Q4H   insulin glargine-yfgn  10 Units Subcutaneous Daily   [START ON 11/11/2022] levothyroxine  40 mcg Intravenous Daily   pantoprazole (PROTONIX) IV  40 mg Intravenous Q24H   polyethylene glycol  17 g Oral Daily   QUEtiapine  200 mg Oral QHS   senna-docusate  1 tablet Oral QHS   sodium chloride flush  3 mL Intravenous Q12H   sodium chloride flush  5 mL Intracatheter Q8H   sodium chloride flush  5 mL Intracatheter Q8H   Continuous Infusions:  ampicillin-sulbactam (UNASYN) IV Stopped (11/09/22 1402)   doxycycline (VIBRAMYCIN) IV Stopped (11/09/22 1223)   TPN ADULT (ION) 90 mL/hr at 11/09/22 1550   TPN ADULT (ION)     PRN Meds:.acetaminophen **OR** acetaminophen, bisacodyl, HYDROmorphone (DILAUDID) injection, hydrOXYzine, ondansetron (ZOFRAN) IV, sodium chloride flush  Antimicrobials: Anti-infectives (From admission, onward)    Start     Dose/Rate Route Frequency Ordered Stop   11/07/22 1045  doxycycline (VIBRAMYCIN) 100 mg in sodium chloride 0.9 % 250 mL IVPB        100 mg 125 mL/hr over 120 Minutes Intravenous Every 12 hours 11/07/22 0957     10/30/22 2000  Ampicillin-Sulbactam (UNASYN) 3 g in sodium chloride 0.9 % 100 mL IVPB        3 g 200 mL/hr over 30 Minutes Intravenous Every 6 hours 10/30/22 1530     10/26/22 2200  meropenem (MERREM) 2 g in sodium chloride 0.9 % 100 mL IVPB  Status:  Discontinued        2 g 280 mL/hr over 30 Minutes Intravenous Every 8 hours 10/26/22 1409 10/30/22 1530   10/26/22 1000  fluconazole (DIFLUCAN) tablet 100 mg       See Hyperspace for full Linked Orders Report.   100 mg Oral Daily 10/25/22 1412 10/31/22 1011   10/25/22 2000  doxycycline (VIBRAMYCIN) 100 mg in sodium chloride 0.9 % 250 mL IVPB  Status:  Discontinued        100 mg 125 mL/hr over 120 Minutes Intravenous Every 12 hours 10/25/22 1304 10/30/22 1530   10/25/22 1500  fluconazole (DIFLUCAN) tablet 200 mg       See Hyperspace for full Linked Orders Report.    200 mg Oral Daily 10/25/22 1412 10/25/22 1718   10/25/22 1400  meropenem (MERREM) 2 g in sodium chloride 0.9 % 100 mL IVPB  Status:  Discontinued        2 g 280 mL/hr over 30 Minutes Intravenous Every 12 hours 10/25/22 1304 10/26/22 1409   10/25/22 1200  doxycycline (VIBRA-TABS) tablet 100 mg  Status:  Discontinued        100 mg Oral Every 12 hours 10/25/22 1103 10/25/22 1109   10/24/22 0045  cefTRIAXone (ROCEPHIN) 2 g in sodium chloride 0.9 % 100 mL IVPB  Status:  Discontinued        2 g 200 mL/hr over 30 Minutes Intravenous Daily at bedtime 10/24/22 0025 10/25/22 1102   10/24/22 0045  metroNIDAZOLE (FLAGYL) IVPB 500 mg  Status:  Discontinued  500 mg 100 mL/hr over 60 Minutes Intravenous 2 times daily 10/24/22 0025 10/25/22 1102   10/24/22 0030  doxycycline (VIBRA-TABS) tablet 100 mg  Status:  Discontinued        100 mg Oral Every 12 hours 10/24/22 0025 10/25/22 1102   10/24/22 0000  piperacillin-tazobactam (ZOSYN) IVPB 3.375 g  Status:  Discontinued        3.375 g 100 mL/hr over 30 Minutes Intravenous  Once 10/23/22 2348 10/24/22 0025        I have personally reviewed the following labs and images: CBC: Recent Labs  Lab 11/05/22 0405 11/06/22 1123 11/07/22 0455 11/08/22 0800 11/09/22 0557  WBC 14.5* 13.3* 14.7* 12.6* 11.1*  NEUTROABS 9.0* 8.6* 8.7* 7.6 6.3  HGB 7.4* 7.6* 8.9* 7.5* 7.4*  HCT 23.3* 23.0* 27.0* 22.7* 22.3*  MCV 87.6 87.1 87.1 87.6 87.8  PLT 457* 439* 489* 413* 363   BMP &GFR Recent Labs  Lab 11/05/22 0405 11/06/22 0615 11/07/22 0455 11/08/22 0800 11/09/22 0557  NA 137 134* 134* 134* 134*  K 4.3 4.3 4.2 4.8 4.1  CL 104 102 103 105 103  CO2 24 24 25 26 26   GLUCOSE 177* 175* 192* 198* 175*  BUN 10 9 9 13 11   CREATININE 0.69 0.66 0.64 0.74 0.71  CALCIUM 8.5* 8.4* 8.5* 8.3* 8.3*  MG 1.8 1.7 1.8 2.0 1.7  PHOS 3.7 4.1 4.4 4.5 4.4   Estimated Creatinine Clearance: 137.4 mL/min (by C-G formula based on SCr of 0.71 mg/dL). Liver &  Pancreas: Recent Labs  Lab 11/04/22 1130 11/05/22 0405 11/07/22 0455 11/08/22 0800 11/09/22 0557  AST 14* 18 16 19 19   ALT 11 12 13 14 16   ALKPHOS 102 98 105 93 87  BILITOT 0.2* 0.3 0.6 0.5 0.5  PROT 5.6* 5.8* 6.1* 5.5* 5.6*  ALBUMIN 1.8* 1.9* 2.1* 1.9* 1.9*   No results for input(s): "LIPASE", "AMYLASE" in the last 168 hours. No results for input(s): "AMMONIA" in the last 168 hours. Diabetic: No results for input(s): "HGBA1C" in the last 72 hours. Recent Labs  Lab 11/08/22 2321 11/09/22 0308 11/09/22 0800 11/09/22 1238 11/09/22 1608  GLUCAP 159* 173* 147* 188* 224*   Cardiac Enzymes: No results for input(s): "CKTOTAL", "CKMB", "CKMBINDEX", "TROPONINI" in the last 168 hours. No results for input(s): "PROBNP" in the last 8760 hours. Coagulation Profile: No results for input(s): "INR", "PROTIME" in the last 168 hours. Thyroid Function Tests: No results for input(s): "TSH", "T4TOTAL", "FREET4", "T3FREE", "THYROIDAB" in the last 72 hours. Lipid Profile: Recent Labs    11/07/22 0455  TRIG 71   Anemia Panel: No results for input(s): "VITAMINB12", "FOLATE", "FERRITIN", "TIBC", "IRON", "RETICCTPCT" in the last 72 hours. Urine analysis:    Component Value Date/Time   COLORURINE AMBER (A) 10/30/2022 0300   APPEARANCEUR TURBID (A) 10/30/2022 0300   LABSPEC 1.031 (H) 10/30/2022 0300   PHURINE 5.0 10/30/2022 0300   GLUCOSEU NEGATIVE 10/30/2022 0300   HGBUR NEGATIVE 10/30/2022 0300   BILIRUBINUR NEGATIVE 10/30/2022 0300   KETONESUR 5 (A) 10/30/2022 0300   PROTEINUR 30 (A) 10/30/2022 0300   UROBILINOGEN 0.2 02/27/2015 1733   NITRITE NEGATIVE 10/30/2022 0300   LEUKOCYTESUR NEGATIVE 10/30/2022 0300   Sepsis Labs: Invalid input(s): "PROCALCITONIN", "LACTICIDVEN"  Microbiology: Recent Results (from the past 240 hour(s))  Aerobic/Anaerobic Culture w Gram Stain (surgical/deep wound)     Status: None   Collection Time: 11/03/22  1:08 PM   Specimen: Abscess  Result Value  Ref Range Status   Specimen Description  ABSCESS  Final   Special Requests Normal  Final   Gram Stain   Final    ABUNDANT WBC PRESENT, PREDOMINANTLY PMN NO ORGANISMS SEEN    Culture   Final    No growth aerobically or anaerobically. Performed at [facility], [address], [place], [place] [address]    Report Status 11/08/2022 FINAL  Final    Radiology Studies: DG Abd 1 View  Result Date: 11/09/2022 CLINICAL DATA:  Pain EXAM: ABDOMEN - 1 VIEW COMPARISON:  11/05/2022. FINDINGS: Mild small bowel dilatation which is a nonspecific finding. Retained oral contrast in colon. Bilateral percutaneous lower abdominal drains. IMPRESSION: Mild nonspecific small bowel dilatation with interval improvement. Electronically Signed   By: Layla Maw M.D.   On: 11/09/2022 08:01      Chanz Cahall T. Peggy Monk [facility]  If 7PM-7AM, please contact night-coverage [email or web address] 11/09/2022, 5:00 PM

## 2022-11-11 DIAGNOSIS — G9341 Metabolic encephalopathy: Secondary | ICD-10-CM | POA: Diagnosis not present

## 2022-11-11 DIAGNOSIS — G9340 Encephalopathy, unspecified: Secondary | ICD-10-CM | POA: Diagnosis not present

## 2022-11-11 DIAGNOSIS — N730 Acute parametritis and pelvic cellulitis: Secondary | ICD-10-CM | POA: Diagnosis not present

## 2022-11-11 DIAGNOSIS — R9431 Abnormal electrocardiogram [ECG] [EKG]: Secondary | ICD-10-CM | POA: Diagnosis not present

## 2022-11-11 LAB — TYPE AND SCREEN: Unit division: 0

## 2022-11-11 LAB — GLUCOSE, CAPILLARY
Glucose-Capillary: 178 mg/dL — ABNORMAL HIGH (ref 70–99)
Glucose-Capillary: 187 mg/dL — ABNORMAL HIGH (ref 70–99)
Glucose-Capillary: 190 mg/dL — ABNORMAL HIGH (ref 70–99)
Glucose-Capillary: 192 mg/dL — ABNORMAL HIGH (ref 70–99)
Glucose-Capillary: 207 mg/dL — ABNORMAL HIGH (ref 70–99)
Glucose-Capillary: 219 mg/dL — ABNORMAL HIGH (ref 70–99)
Glucose-Capillary: 223 mg/dL — ABNORMAL HIGH (ref 70–99)

## 2022-11-11 LAB — BASIC METABOLIC PANEL
Anion gap: 4 — ABNORMAL LOW (ref 5–15)
BUN: 12 mg/dL (ref 6–20)
CO2: 27 mmol/L (ref 22–32)
Calcium: 8.4 mg/dL — ABNORMAL LOW (ref 8.9–10.3)
Chloride: 105 mmol/L (ref 98–111)
Creatinine, Ser: 0.72 mg/dL (ref 0.44–1.00)
GFR, Estimated: >60 mL/min (ref >60)
Glucose, Bld: 183 mg/dL — ABNORMAL HIGH (ref 70–99)
Potassium: 3.9 mmol/L (ref 3.5–5.1)
Sodium: 136 mmol/L (ref 135–145)

## 2022-11-11 LAB — CBC
HCT: 23.9 % — ABNORMAL LOW (ref 36.0–46.0)
Hemoglobin: 7.8 g/dL — ABNORMAL LOW (ref 12.0–15.0)
MCH: 30.2 pg (ref 26.0–34.0)
MCHC: 32.6 g/dL (ref 30.0–36.0)
MCV: 92.6 fL (ref 80.0–100.0)
Platelets: 283 10*3/uL (ref 150–400)
RBC: 2.58 MIL/uL — ABNORMAL LOW (ref 3.87–5.11)
RDW: 16.6 % — ABNORMAL HIGH (ref 11.5–15.5)
WBC: 8.5 10*3/uL (ref 4.0–10.5)
nRBC: 0 % (ref 0.0–0.2)

## 2022-11-11 LAB — BPAM RBC
ISSUE DATE / TIME: 202404111252
Unit Type and Rh: 9500

## 2022-11-11 LAB — MAGNESIUM: Magnesium: 1.8 mg/dL (ref 1.7–2.4)

## 2022-11-11 MED ORDER — POTASSIUM CHLORIDE CRYS ER 20 MEQ PO TBCR
20.0000 meq | EXTENDED_RELEASE_TABLET | Freq: Once | ORAL | Status: AC
  Administered 2022-11-11: 20 meq via ORAL
  Filled 2022-11-11: qty 1

## 2022-11-11 MED ORDER — HYDROMORPHONE HCL 1 MG/ML IJ SOLN: 0.5000 mg | Freq: Four times a day (QID) | INTRAMUSCULAR | Status: DC | PRN

## 2022-11-11 MED ORDER — TRAVASOL 10 % IV SOLN
INTRAVENOUS | Status: AC
  Filled 2022-11-11: qty 1123.2

## 2022-11-11 MED ORDER — DOXYCYCLINE HYCLATE 100 MG PO TABS
100.0000 mg | ORAL_TABLET | Freq: Two times a day (BID) | ORAL | Status: DC
  Administered 2022-11-11 – 2022-11-13 (×4): 100 mg via ORAL
  Filled 2022-11-11 (×4): qty 1

## 2022-11-11 MED ORDER — HYDROMORPHONE HCL 1 MG/ML IJ SOLN
0.5000 mg | INTRAMUSCULAR | Status: DC | PRN
  Administered 2022-11-11 – 2022-11-16 (×27): 0.5 mg via INTRAVENOUS
  Filled 2022-11-11 (×28): qty 0.5

## 2022-11-11 MED ORDER — MAGNESIUM SULFATE 2 GM/50ML IV SOLN
2.0000 g | Freq: Once | INTRAVENOUS | Status: AC
  Administered 2022-11-11: 2 g via INTRAVENOUS
  Filled 2022-11-11: qty 50

## 2022-11-11 MED ORDER — AMOXICILLIN-POT CLAVULANATE 875-125 MG PO TABS
1.0000 | ORAL_TABLET | Freq: Two times a day (BID) | ORAL | Status: DC
  Administered 2022-11-11 – 2022-11-13 (×4): 1 via ORAL
  Filled 2022-11-11 (×4): qty 1

## 2022-11-11 NOTE — Plan of Care (Signed)
  Problem: Skin Integrity: Goal: Risk for impaired skin integrity will decrease Outcome: Progressing   Problem: Activity: Goal: Risk for activity intolerance will decrease Outcome: Progressing   Problem: Nutrition: Goal: Adequate nutrition will be maintained Outcome: Progressing   Problem: Pain Managment: Goal: General experience of comfort will improve Outcome: Progressing   Problem: Safety: Goal: Ability to remain free from injury will improve Outcome: Progressing   

## 2022-11-11 NOTE — Progress Notes (Signed)
PROGRESS NOTE  [name] BJY:[id number] DOB: 12-09-1970   PCP: Verlon Au, MD  Patient is from: Home.  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  Patient was started on antibiotics.  Gynecology, general surgery and interventional radiology consulted.  Patient had left pelvic drain placed on 3/26.  Fluid culture with Streptococcus anginosus.  Mentation improved.  She developed nausea and vomiting.  Concerned about ileus/SBO on KUB on 3/30.  NG tube placed for SBO protocol but patient was not able to tolerate.  PICC line placed and started on TPN on 4/2.  Repeat CT abdomen and pelvis on 4/2 with complete evacuation of left adnexal fluid collection but persistent poorly loculated fluid within the right adnexa and new loculated rim-enhancing fluid collection within RLQ measuring 3.2 x 7.4 x 5.0 cm, and developing mid SBO with single-point transition.  Patient had RLQ drain placement on 4/4.  Gyn recommended TVUS and doxycycline to treat for possible PID.  TVUS showed small amount of free fluid in pelvis and the ovaries were not visualized.  ID recommended continuing IV Unasyn and transition to p.o. Augmentin when able to tolerate p.o.  Currently on TPN, IV ampicillin and doxycycline.  Advanced diet to soft.   Subjective: Seen and examined earlier this morning.  No major events overnight of this morning.  No complaints.  Tried some grapes yesterday and this morning.  Anxious to go to soft diet but willing to try.  Drain output improved, 23 cc in the last 24 hours.   Transfuse 1 unit yesterday but minimal improvement in hemoglobin from 7.4-7.8.  No evidence of bleeding.  Objective: Vitals:   11/10/22 2345 11/11/22 0404 11/11/22 0714 11/11/22 0800  BP: 123/62 122/61 (!) 97/54 112/61  Pulse: 92 94 86   Resp: Temp: 98.8 F (37.1 C) 99.1 F (37.3 C) 98.6 F (37 C) 98.6 F (37 C)  TempSrc: Oral Oral Oral   SpO2: 98% 96%    Weight:      Height:        Examination:  GENERAL: No apparent distress.  Nontoxic. HEENT: MMM.  Vision and hearing grossly intact.  NECK: Supple.  No apparent JVD.  RESP:  No IWOB.  Fair aeration bilaterally but difficult exam due to body habitus. CVS:  RRR. Heart sounds normal.  ABD/GI/GU: BS+. Abd soft.  Mild tenderness across lower abdomen.  RLQ drain empty.  LLQ drain with small purulent output. MSK/EXT:   No apparent deformity. Moves extremities. No edema.  SKIN: no apparent skin lesion or wound NEURO: Awake and alert. Oriented appropriately.  No apparent focal neuro deficit. PSYCH: Calm. Normal affect.   Procedures:  3/26-CT-guided left pelvic drain 4/4-CT-guided RLQ drain  Microbiology summarized: 3/24-wet prep negative 3/26-drain culture with Streptococcus anginosus 3/26-blood cultures NGTD 3/31-urine culture with insignificant growth 4/4-drain culture negative.  Assessment and plan: Principal Problem:   Septic encephalopathy Active Problems:   Hypertension complicating diabetes   Chronic systolic CHF (congestive heart failure)   SJOGREN'S SYNDROME   QT prolongation   Type 2 diabetes mellitus with hyperlipidemia   History of CVA (cerebrovascular accident)  Mood disorder   SLE (systemic lupus erythematosus related syndrome)   PID (acute pelvic inflammatory disease)   Intra-abdominal abscess   Severe sepsis  Severe sepsis secondary to intra-abdominal abscess concerning for PID/TOA: Met criteria for severe sepsis with fever, leukocytosis, AKI and encephalopathy. -Imagings as  above. -S/p LLQ drain placement on 3/26 and RLQ drain placement on 4/4 -Culture data as above.  -Initial plan was antibiotics for a total of 14 days from 3/25 -Change Unasyn to Augmentin and doxycycline to p.o. until repeat CT -Per IR, repeat CT once drain output minimal.  Drain output 23 cc over the last 24 hours.  Acute metabolic encephalopathy: Likely due to the above.  Other encephalopathy workup including ammonia, TSH, HIV, RPR, EEG, CT head and VBG without significant finding.  Encephalopathy resolved. -Reorientation and delirium precautions -Minimize or avoid sedating medications-decreasing Dilaudid  Persistent ileus versus partial SBO: Slowly improving. -Continue TPN -Advance diet to dysphagia 3 diet per SLP recommendation -PT/OT for mobility  Chronic combined CHF with recovered EF: TTE on 3/25 with LVEF of 55 to 60% (30 to 35% in 2022), indeterminate DD and normal RV.  Difficult to assess fluid status due to body habitus.  No significant cardiopulmonary symptoms.  On torsemide 20 mg daily and Jardiance 10 mg daily at home. -Continue monitoring fluid and respiratory status -Continue holding home torsemide and Jardiance.  Normocytic anemia: Hgb drifting down after initial improvement.  No overt bleeding.  Anemia panel with mild folic acid deficiency.  Transfused 1 unit on 4/11 with minimal response. Recent Labs    11/02/22 1053 11/03/22 1700 11/04/22 0755 11/05/22 0405 11/06/22 1123 11/07/22 0455 11/08/22 0800 11/09/22 0557 11/10/22 0617 11/11/22 0346  HGB 7.9* 7.4* 7.2* 7.4* 7.6* 8.9* 7.5* 7.4* 7.3* 7.8*  -Recheck H&H in the morning -Continue folic acid supplementation  Uncontrolled IDDM-2 with hyperglycemia: A1c 7.4% Recent Labs  Lab 11/10/22 2020 11/10/22 2349 11/11/22 0410 11/11/22 0759 11/11/22 1156  GLUCAP 195* 188* 187* 178* 190*  -Increase Semglee from 10 to 15 units daily -Continue SSI-resistant scale -Further adjustment as appropriate.  History of  PE/DVT: On low-dose Eliquis at home. -Continue prophylactic dose Lovenox.  Lupus and Sjogren's Syndrome: On Plaquenil, Benlysta and Humira. -Holding home meds in the setting of active infection  Syncope?  Due to #1.  TTE reassuring. -Treat infection as above  UTI ruled out.   AKI: Resolved.   Gastroparesis?  Intolerant to Reglan. -Continue Zofran.   Hypokalemia/hypomagnesemia -Monitor replenish as appropriate   Mood disorder: Somewhat anxious.  Pharmacy not able to verify home meds. -On Seroquel 200 mg at night.  Seems to be on 300 mg at home -Xanax and Wellbutrin discontinued early in the course   Hypothyroidism -Continue home Synthroid   Overactive bladder: Seems to be stable off home oxybutynin.   GERD -Continue PPI   Diabetic Neuropathy: Lyrica discontinued due to encephalopathy.  Stable.   Hyperlipidemia. -Holding Crestor until off of TPN    Oral candidiasis. -Completed a week of Fluconazole.   Morbid obesity with significant comorbidity as above Body mass index is 56.18 kg/m.  Inadequate oral intake.  Severe malnutrition ruled out Nutrition Problem: Inadequate oral intake Etiology: poor appetite, nausea Signs/Symptoms: meal completion < 25%, NPO status, per patient/family report Interventions: Refer to RD note for recommendations   DVT prophylaxis:  Place and maintain sequential compression device Start: 10/25/22 1103  Code Status: Full code Family Communication: None at bedside Level of care: Telemetry Medical Status is: Inpatient Remains inpatient appropriate  because: Intra-abdominal infection/abscess   Final disposition: TBD Consultants:  General surgery Interventional radiology Gynecology Infectious disease  55 minutes with more than 50% spent in reviewing records, counseling patient/family and coordinating care.   Sch Meds:  Scheduled Meds:  (feeding supplement) PROSource Plus  30 mL Oral BID BM   amoxicillin-clavulanate  1 tablet  Oral Q12H   carvedilol  3.125 mg Oral BID WC   Chlorhexidine Gluconate Cloth  6 each Topical Daily   doxycycline  100 mg Oral Q12H   enoxaparin (LOVENOX) injection  80 mg Subcutaneous Q24H   feeding supplement  1 Container Oral Q24H   folic acid  1 mg Oral Daily   insulin aspart  0-20 Units Subcutaneous Q4H   insulin glargine-yfgn  15 Units Subcutaneous Daily   levothyroxine  50 mcg Oral Q0600   pantoprazole (PROTONIX) IV  40 mg Intravenous Q24H   polyethylene glycol  17 g Oral Daily   QUEtiapine  200 mg Oral QHS   senna-docusate  1 tablet Oral QHS   sodium chloride flush  3 mL Intravenous Q12H   sodium chloride flush  5 mL Intracatheter Q8H   sodium chloride flush  5 mL Intracatheter Q8H   Continuous Infusions:  TPN ADULT (ION) 90 mL/hr at 11/11/22 0203   PRN Meds:.acetaminophen **OR** acetaminophen, bisacodyl, HYDROmorphone (DILAUDID) injection, hydrOXYzine, ondansetron (ZOFRAN) IV, sodium chloride flush  Antimicrobials: Anti-infectives (From admission, onward)    Start     Dose/Rate Route Frequency Ordered Stop   11/11/22 1300  amoxicillin-clavulanate (AUGMENTIN) 875-125 MG per tablet 1 tablet        1 tablet Oral Every 12 hours 11/11/22 1212     11/11/22 1300  doxycycline (VIBRA-TABS) tablet 100 mg        100 mg Oral Every 12 hours 11/11/22 1212     11/07/22 1045  doxycycline (VIBRAMYCIN) 100 mg in sodium chloride 0.9 % 250 mL IVPB  Status:  Discontinued        100 mg 125 mL/hr over 120 Minutes Intravenous Every 12 hours 11/07/22 0957 11/11/22 1212   10/30/22 2000  Ampicillin-Sulbactam (UNASYN) 3 g in sodium chloride 0.9 % 100 mL IVPB  Status:  Discontinued        3 g 200 mL/hr over 30 Minutes Intravenous Every 6 hours 10/30/22 1530 11/11/22 1212   10/26/22 2200  meropenem (MERREM) 2 g in sodium chloride 0.9 % 100 mL IVPB  Status:  Discontinued        2 g 280 mL/hr over 30 Minutes Intravenous Every 8 hours 10/26/22 1409 10/30/22 1530   10/26/22 1000  fluconazole (DIFLUCAN)  tablet 100 mg       See Hyperspace for full Linked Orders Report.   100 mg Oral Daily 10/25/22 1412 10/31/22 1011   10/25/22 2000  doxycycline (VIBRAMYCIN) 100 mg in sodium chloride 0.9 % 250 mL IVPB  Status:  Discontinued        100 mg 125 mL/hr over 120 Minutes Intravenous Every 12 hours 10/25/22 1304 10/30/22 1530   10/25/22 1500  fluconazole (DIFLUCAN) tablet 200 mg       See Hyperspace for full Linked Orders Report.   200 mg Oral Daily 10/25/22 1412 10/25/22 1718   10/25/22 1400  meropenem (MERREM) 2 g in sodium chloride 0.9 % 100 mL IVPB  Status:  Discontinued        2 g 280 mL/hr over 30 Minutes Intravenous Every 12 hours 10/25/22 1304 10/26/22 1409   10/25/22 1200  doxycycline (  VIBRA-TABS) tablet 100 mg  Status:  Discontinued        100 mg Oral Every 12 hours 10/25/22 1103 10/25/22 1109   10/24/22 0045  cefTRIAXone (ROCEPHIN) 2 g in sodium chloride 0.9 % 100 mL IVPB  Status:  Discontinued        2 g 200 mL/hr over 30 Minutes Intravenous Daily at bedtime 10/24/22 0025 10/25/22 1102   10/24/22 0045  metroNIDAZOLE (FLAGYL) IVPB 500 mg  Status:  Discontinued        500 mg 100 mL/hr over 60 Minutes Intravenous 2 times daily 10/24/22 0025 10/25/22 1102   10/24/22 0030  doxycycline (VIBRA-TABS) tablet 100 mg  Status:  Discontinued        100 mg Oral Every 12 hours 10/24/22 0025 10/25/22 1102   10/24/22 0000  piperacillin-tazobactam (ZOSYN) IVPB 3.375 g  Status:  Discontinued        3.375 g 100 mL/hr over 30 Minutes Intravenous  Once 10/23/22 2348 10/24/22 0025        I have personally reviewed the following labs and images: CBC: Recent Labs  Lab 11/05/22 0405 11/06/22 1123 11/07/22 0455 11/08/22 0800 11/09/22 0557 11/10/22 0617 11/11/22 0346  WBC 14.5* 13.3* 14.7* 12.6* 11.1* 9.7 8.5  NEUTROABS 9.0* 8.6* 8.7* 7.6 6.3  --   --   HGB 7.4* 7.6* 8.9* 7.5* 7.4* 7.3* 7.8*  HCT 23.3* 23.0* 27.0* 22.7* 22.3* 22.6* 23.9*  MCV 87.6 87.1 87.1 87.6 87.8 89.3 92.6  PLT 457* 439*  489* 413* 363 316 283   BMP &GFR Recent Labs  Lab 11/06/22 0615 11/07/22 0455 11/08/22 0800 11/09/22 0557 11/10/22 0617 11/11/22 1039  NA 134* 134* 134* 134* 135 136  K 4.3 4.2 4.8 4.1 4.0 3.9  CL 102 103 105 103 102 105  CO2 GLUCOSE 175* 192* 198* 175* 187* 183*  BUN CREATININE 0.66 0.64 0.74 0.71 0.68 0.72  CALCIUM 8.4* 8.5* 8.3* 8.3* 8.3* 8.4*  MG 1.7 1.8 2.0 1.7 1.7 1.8  PHOS 4.1 4.4 4.5 4.4 4.1  --    Estimated Creatinine Clearance: 136.9 mL/min (by C-G formula based on SCr of 0.72 mg/dL). Liver & Pancreas: Recent Labs  Lab 11/05/22 0405 11/07/22 0455 11/08/22 0800 11/09/22 0557 11/10/22 0617  AST ALT ALKPHOS 98 105 93 87 79  BILITOT 0.3 0.6 0.5 0.5 0.5  PROT 5.8* 6.1* 5.5* 5.6* 5.4*  ALBUMIN 1.9* 2.1* 1.9* 1.9* 1.8*   No results for input(s): "LIPASE", "AMYLASE" in the last 168 hours. No results for input(s): "AMMONIA" in the last 168 hours. Diabetic: Recent Labs    11/10/22 0617  HGBA1C 7.4*   Recent Labs  Lab 11/10/22 2020 11/10/22 2349 11/11/22 0410 11/11/22 0759 11/11/22 1156  GLUCAP 195* 188* 187* 178* 190*   Cardiac Enzymes: No results for input(s): "CKTOTAL", "CKMB", "CKMBINDEX", "TROPONINI" in the last 168 hours. No results for input(s): "PROBNP" in the last 8760 hours. Coagulation Profile: No results for input(s): "INR", "PROTIME" in the last 168 hours. Thyroid Function Tests: No results for input(s): "TSH", "T4TOTAL", "FREET4", "T3FREE", "THYROIDAB" in the last 72 hours. Lipid Profile: Recent Labs    11/10/22 0617  TRIG 71   Anemia Panel: Recent Labs    11/10/22 0617  VITAMINB12 465  FOLATE 5.0*  FERRITIN 180  TIBC 231*  IRON 36  RETICCTPCT 5.4*   Urine analysis:  Component Value Date/Time   COLORURINE AMBER (A) 10/30/2022 0300   APPEARANCEUR TURBID (A) 10/30/2022 0300   LABSPEC 1.031 (H) 10/30/2022 0300   PHURINE 5.0 10/30/2022 0300   GLUCOSEU  NEGATIVE 10/30/2022 0300   HGBUR NEGATIVE 10/30/2022 0300   BILIRUBINUR NEGATIVE 10/30/2022 0300   KETONESUR 5 (A) 10/30/2022 0300   PROTEINUR 30 (A) 10/30/2022 0300   UROBILINOGEN 0.2 02/27/2015 1733   NITRITE NEGATIVE 10/30/2022 0300   LEUKOCYTESUR NEGATIVE 10/30/2022 0300   Sepsis Labs: Invalid input(s): "PROCALCITONIN", "LACTICIDVEN"  Microbiology: Recent Results (from the past 240 hour(s))  Aerobic/Anaerobic Culture w Gram Stain (surgical/deep wound)     Status: None   Collection Time: 11/03/22  1:08 PM   Specimen: Abscess  Result Value Ref Range Status   Specimen Description ABSCESS  Final   Special Requests Normal  Final   Gram Stain   Final    ABUNDANT WBC PRESENT, PREDOMINANTLY PMN NO ORGANISMS SEEN    Culture   Final    No growth aerobically or anaerobically. Performed at [facility], [address], [place], [place] [address]    Report Status 11/08/2022 FINAL  Final    Radiology Studies: No results found.    [name]. Muriel Wilber [facility]  If 7PM-7AM, please contact night-coverage [email or web address] 11/11/2022, 12:14 PM

## 2022-11-11 NOTE — Progress Notes (Signed)
PHARMACY - TOTAL PARENTERAL NUTRITION CONSULT NOTE   Indication: Small bowel obstruction and prolonged inability to tolerate PO intake  Patient Measurements: Height: 5\' 8"  (172.7 cm) Weight: (!) 167.6 kg (369 lb 7.9 oz) IBW/kg (Calculated) : 63.9 TPN AdjBW (KG): 86.4 Body mass index is 56.18 kg/m.  Assessment:  52 yo female with PMH of Bell's palsy, DM, CHF, hypothyroidism, hx DVT on Eliquis. Recently discharged on 3/21 after admission for abdominal pain and intractable nausea/vomiting. Brought back to ED on 3/24 for AMS and loss of consciousness. Endorsed abdominal pain and nausea/vomiting on day of admission. Pharmacy has been consulted on 4/3 to begin TPN as patient now with evidence of SBO on imaging and prolonged poor PO intake.   Discussed with patient on 4/3, she states that she really has not been able to tolerate a meal since before her previous admission ~3/16. Has a few juice cups and water cup on her tray, but states she is really only able to drink liquids when she is taking meds. Intake causes her to experience nausea and abdominal pain. Patient stated that just thinking about food/liquid/PO intake during our discussion made her feel nauseous.   Patient now with BM overnight, diet increased to full liquids.   Glucose / Insulin: hx T2DM, last A1c 7.4% 05/2022, on U-500 100 units TID, metformin, Jardiance, and Ozempic PTA >> was started on Semglee 100 units BID here but reduced down to Semglee 10 units QD, increased to 15 u on 4/11 but has not received - CBGs up to 187-226, required 28 units of SSI/24h while on rSSI, 15u insulin in TPN  Electrolytes: Mag 1.8, K: 3.9, Na: 136, CoCa: 10.2, all others WNL  - Goal K >/= 4 and Mag >/= 2 Renal: Scr <1, BUN 11 Hepatic: 4/11: LFTs wnl, 4/11: TG: 71  Intake / Output: UOP 0.105mL/kg/hr, +12L this admission still holding home diuretics, normal volume status, LBM 4/10, drain output 0 mL, PO intake  MIVF: none GI Imaging: - 3/24 CTAP:  inflammatory/infectious process such as PID w/ developing abscess, no bowel obstruction - 3/29 Abd X-ray: multiple dilated air-filled loops of small and large bowel, could represent ileus or developing obstruction - 4/2 CTAP: new loculated rim enhancing fluid collection w/in the RLQ, several loops of distal small bowel, developing mid small bowel obstruction - 4/5 Abd x-ray: persistent dilated loops of small bowel which may reflect ileus or pSBO - 4/6 Abd Xray: no significant change in small bowel dilation GI Surgeries / Procedures:  - 3/26: CT-guided left pelvic abscess drain placement  Central access: 4/3 TPN start date: 4/3  Nutritional Goals: Goal TPN rate is 90 mL/hr (provides 112 g of protein and 1988 kcals per day)  RD Assessment: Estimated Needs Total Energy Estimated Needs: 1900-2100 kcal/d Total Protein Estimated Needs: 100-120 g/d Total Fluid Estimated Needs: 2L/d  Current Nutrition:  TPN Full liquids started 4/8 -  patient only had a few sips of potato soup for dinner and a few bites of grits for breakfast this AM. Patient reports nausea associated with eating is controlled by meds given prior. She is hopeful to try to increase her oral intake today. Patient was interested in the typical TPN weaning process, encouraged her to try to increase PO to hopefully wean TPN.  Boost x1 on 4/11   Plan:  Continue TPN at 46mL/hr at 1800 providing 112g AA and 1988 kcal meeting 100% of nutritional needs.  Electrolytes in TPN: Na 118mEq/L, small increase in K 77mEq/L (  to total), Ca 93mEq/L, increase Mg 12 mEq/L, and Phos 12mmol/L. Cl:Ac 1:1.  Will give 2g IV magnesium and Kcl to reach goal > 2 and > 4.  Add standard MVI and trace elements to TPN Continue resistant q4h SSI and adjust as needed.  Continue 15 u of insulin in TPN, will trend requirements with new higher dose basal insulin per MD.  Monitor TPN labs on Mon/Thurs, will recheck tmrw AM  F/u ability to advance  diet   Estill Batten, PharmD, BCCCP  11/11/2022 7:11 AM

## 2022-11-12 ENCOUNTER — Inpatient Hospital Stay (HOSPITAL_COMMUNITY): Payer: Medicaid Other

## 2022-11-12 DIAGNOSIS — R9431 Abnormal electrocardiogram [ECG] [EKG]: Secondary | ICD-10-CM | POA: Diagnosis not present

## 2022-11-12 DIAGNOSIS — G9341 Metabolic encephalopathy: Secondary | ICD-10-CM | POA: Diagnosis not present

## 2022-11-12 DIAGNOSIS — N730 Acute parametritis and pelvic cellulitis: Secondary | ICD-10-CM | POA: Diagnosis not present

## 2022-11-12 DIAGNOSIS — G9340 Encephalopathy, unspecified: Secondary | ICD-10-CM | POA: Diagnosis not present

## 2022-11-12 LAB — GLUCOSE, CAPILLARY
Glucose-Capillary: 170 mg/dL — ABNORMAL HIGH (ref 70–99)
Glucose-Capillary: 184 mg/dL — ABNORMAL HIGH (ref 70–99)
Glucose-Capillary: 183 mg/dL — ABNORMAL HIGH (ref 70–99)
Glucose-Capillary: 200 mg/dL — ABNORMAL HIGH (ref 70–99)
Glucose-Capillary: 165 mg/dL — ABNORMAL HIGH (ref 70–99)
Glucose-Capillary: 160 mg/dL — ABNORMAL HIGH (ref 70–99)

## 2022-11-12 LAB — BASIC METABOLIC PANEL
Anion gap: 4 — ABNORMAL LOW (ref 5–15)
BUN: 12 mg/dL (ref 6–20)
CO2: 23 mmol/L (ref 22–32)
Calcium: 8 mg/dL — ABNORMAL LOW (ref 8.9–10.3)
Chloride: 107 mmol/L (ref 98–111)
Creatinine, Ser: 0.65 mg/dL (ref 0.44–1.00)
GFR, Estimated: >60 mL/min (ref >60)
Glucose, Bld: 178 mg/dL — ABNORMAL HIGH (ref 70–99)
Potassium: 4.2 mmol/L (ref 3.5–5.1)
Sodium: 134 mmol/L — ABNORMAL LOW (ref 135–145)

## 2022-11-12 LAB — MAGNESIUM: Magnesium: 1.8 mg/dL (ref 1.7–2.4)

## 2022-11-12 LAB — CBC
HCT: 25.2 % — ABNORMAL LOW (ref 36.0–46.0)
Hemoglobin: 8.3 g/dL — ABNORMAL LOW (ref 12.0–15.0)
MCH: 29.1 pg (ref 26.0–34.0)
MCHC: 32.9 g/dL (ref 30.0–36.0)
MCV: 88.4 fL (ref 80.0–100.0)
Platelets: 253 10*3/uL (ref 150–400)
RBC: 2.85 MIL/uL — ABNORMAL LOW (ref 3.87–5.11)
RDW: 15.9 % — ABNORMAL HIGH (ref 11.5–15.5)
WBC: 8.9 10*3/uL (ref 4.0–10.5)
nRBC: 0 % (ref 0.0–0.2)

## 2022-11-12 MED ORDER — IOHEXOL 350 MG/ML SOLN
75.0000 mL | Freq: Once | INTRAVENOUS | Status: AC | PRN
  Administered 2022-11-12: 75 mL via INTRAVENOUS

## 2022-11-12 MED ORDER — MAGNESIUM SULFATE 2 GM/50ML IV SOLN
2.0000 g | Freq: Once | INTRAVENOUS | Status: AC
  Administered 2022-11-12: 2 g via INTRAVENOUS
  Filled 2022-11-12: qty 50

## 2022-11-12 MED ORDER — TRAVASOL 10 % IV SOLN
INTRAVENOUS | Status: AC
  Filled 2022-11-12: qty 1123.2

## 2022-11-12 MED ORDER — IOHEXOL 9 MG/ML PO SOLN
500.0000 mL | ORAL | Status: AC
  Administered 2022-11-12 (×2): 500 mL via ORAL

## 2022-11-12 NOTE — Progress Notes (Signed)
PROGRESS NOTE  [name] GNF:[id number] DOB: October 27, 1970   PCP: Verlon Au, MD  Patient is from: Home.  DOA: 10/23/2022 LOS: 19  Chief complaints Chief Complaint  Patient presents with   Code Stroke     Brief Narrative / Interim history: 52 year old F with PMH of morbid obesity, combined CHF, RV failure, DVT/PE on prophylactic dose Eliquis, hypothyroidism, anxiety and depression presenting with lightheadedness, abdominal pain and confusion, and admitted for severe sepsis with acute encephalopathy in the setting of intra-abdominal abscess concerning for PID/TOA.  Patient was started on antibiotics.  Gynecology, general surgery and interventional radiology consulted.  Patient had left pelvic drain placed on 3/26.  Fluid culture with Streptococcus anginosus.  Mentation improved.  She developed nausea and vomiting.  Concerned about ileus/SBO on KUB on 3/30.  NG tube placed for SBO protocol but patient was not able to tolerate.  PICC line placed and started on TPN on 4/2.  Repeat CT abdomen and pelvis on 4/2 with complete evacuation of left adnexal fluid collection but persistent poorly loculated fluid within the right adnexa and new loculated rim-enhancing fluid collection within RLQ measuring 3.2 x 7.4 x 5.0 cm, and developing mid SBO with single-point transition.  Patient had RLQ drain placement on 4/4.  Gyn recommended TVUS and doxycycline to treat for possible PID.  TVUS showed small amount of free fluid in pelvis and the ovaries were not visualized.  ID recommended continuing IV Unasyn and transition to p.o. Augmentin when able to tolerate p.o.  Currently on TPN, IV ampicillin and doxycycline.  Advanced diet to soft.  Drain output less than 10 cc from his drain.  CT abdomen and pelvis ordered.   Subjective: Seen and examined earlier this morning.  No major events overnight of this morning.  Continues to endorse abdominal pain especially with sitting.  Trying soft diet but appetite  is not great.  She denies nausea or vomiting. Patient is very resistant about cutting down on IV Dilaudid. Extensive discussion about risk of using IV narcotics for a long time.    Objective: Vitals:   11/12/22 0500 11/12/22 0640 11/12/22 0702 11/12/22 1054  BP: (!) 89/49 (!) 108/51 125/67 121/60  Pulse: 93  94 97  Resp: 16  15   Temp: 98.4 F (36.9 C)  98.2 F (36.8 C) 98.4 F (36.9 C)  TempSrc: Oral  Oral Oral  SpO2: 97%  98% 98%  Weight:      Height:        Examination:  GENERAL: No apparent distress.  Nontoxic. HEENT: MMM.  Vision and hearing grossly intact.  NECK: Supple.  No apparent JVD.  RESP:  No IWOB.  Fair aeration bilaterally but difficult exam due to body habitus. CVS:  RRR. Heart sounds normal.  ABD/GI/GU: BS+. Abd soft.  Mild tenderness across lower abdomen.  RLQ drain empty.  LLQ drain with small purulent output. MSK/EXT:   No apparent deformity. Moves extremities. No edema.  SKIN: no apparent skin lesion or wound NEURO: Awake and alert. Oriented appropriately.  No apparent focal neuro deficit. PSYCH: Calm. Normal affect.   Procedures:  3/26-CT-guided left pelvic drain 4/4-CT-guided RLQ drain  Microbiology summarized: 3/24-wet prep negative 3/26-drain culture with Streptococcus anginosus 3/26-blood cultures NGTD 3/31-urine culture with insignificant growth 4/4-drain culture negative.  Assessment and plan: Principal Problem:   Septic encephalopathy Active Problems:   Hypertension complicating diabetes   Chronic systolic CHF (congestive heart failure)   SJOGREN'S SYNDROME   QT prolongation   Type  2 diabetes mellitus with hyperlipidemia   History of CVA (cerebrovascular accident)   Mood disorder   SLE (systemic lupus erythematosus related syndrome)   PID (acute pelvic inflammatory disease)   Intra-abdominal abscess   Severe sepsis  Severe sepsis secondary to intra-abdominal abscess concerning for PID/TOA: Met criteria for severe sepsis with  fever, leukocytosis, AKI and encephalopathy. -Imagings as above. -S/p LLQ drain placement on 3/26 and RLQ drain placement on 4/4 -Culture data as above.  -Initial plan was antibiotics for a total of 14 days from 3/25 but extending abdominal repeat CT abdomen and pelvis -Changed Unasyn to Augmentin and doxycycline to p.o. on 4/12  -Drain output decreased.  3 cc from the bulb and 10 cc from drain in LLQ. -Repeat CT abdomen and pelvis ordered. -Extensive discussion about the risk of using IV narcotics around-the-clock.  Does not want any IV Dilaudid decreased.  Has allergy to oxycodone and Norco which limits p.o. option. -OOB/PT/OT  Acute metabolic encephalopathy: Likely due to the above.  Other encephalopathy workup including ammonia, TSH, HIV, RPR, EEG, CT head and VBG without significant finding.  Encephalopathy resolved. -Reorientation and delirium precautions -Minimize or avoid sedating medications-decreasing Dilaudid  Persistent ileus versus partial SBO: Seems to have resolved.  Reports having bowel movements. -Continue TPN -Continue dysphagia 3 diet -PT/OT for mobility  Chronic combined CHF with recovered EF: TTE on 3/25 with LVEF of 55 to 60% (30 to 35% in 2022), indeterminate DD and normal RV.  Difficult to assess fluid status due to body habitus.  No significant cardiopulmonary symptoms.  On torsemide 20 mg daily and Jardiance 10 mg daily at home. -Continue monitoring fluid and respiratory status -Continue holding home torsemide and Jardiance.  Normocytic anemia: Hgb drifting down after initial improvement.  No overt bleeding.  Anemia panel with mild folic acid deficiency.  Transfused 1 unit on 4/11 with appropriate response. Recent Labs    11/03/22 1700 11/04/22 0755 11/05/22 0405 11/06/22 1123 11/07/22 0455 11/08/22 0800 11/09/22 0557 11/10/22 0617 11/11/22 0346 11/12/22 0332  HGB 7.4* 7.2* 7.4* 7.6* 8.9* 7.5* 7.4* 7.3* 7.8* 8.3*  -Monitor H&H intermittently -Continue  folic acid  Uncontrolled IDDM-2 with hyperglycemia: A1c 7.4% Recent Labs  Lab 11/11/22 2022 11/11/22 2352 11/12/22 0501 11/12/22 0801 11/12/22 1206  GLUCAP 223* 192* 170* 184* 183*  -Continue Semglee 15 units daily -Pharmacy increased TPN insulin as well. -Continue SSI-resistant scale -Further adjustment as appropriate.  History of PE/DVT: On low-dose Eliquis at home. -Continue prophylactic dose Lovenox.  Lupus and Sjogren's Syndrome: On Plaquenil, Benlysta and Humira. -Holding home meds in the setting of active infection  Syncope?  Due to #1.  TTE reassuring. -Treat infection as above  UTI ruled out.   AKI: Resolved.   Gastroparesis?  Intolerant to Reglan. -Continue Zofran.   Hypokalemia/hypomagnesemia -Monitor replenish as appropriate   Mood disorder: Somewhat anxious.  Pharmacy not able to verify home meds. -On Seroquel 200 mg at night.  Seems to be on 300 mg at home -Xanax and Wellbutrin discontinued early in the course   Hypothyroidism -Continue home Synthroid   Overactive bladder: Seems to be stable off home oxybutynin.   GERD -Continue PPI   Diabetic Neuropathy: Lyrica discontinued due to encephalopathy.  Stable.   Hyperlipidemia. -Holding Crestor until off of TPN    Oral candidiasis. -Completed a week of Fluconazole.   Morbid obesity with significant comorbidity as above Body mass index is 56.18 kg/m.  Inadequate oral intake.  Severe malnutrition ruled out Nutrition Problem: Inadequate  oral intake Etiology: poor appetite, nausea Signs/Symptoms: meal completion < 25%, NPO status, per patient/family report Interventions: Refer to RD note for recommendations   DVT prophylaxis:  Place and maintain sequential compression device Start: 10/25/22 1103 On Lovenox.  Code Status: Full code Family Communication: None at bedside Level of care: Telemetry Medical Status is: Inpatient Remains inpatient appropriate because: Intra-abdominal  infection/abscess   Final disposition: TBD.  Likely needs placement Consultants:  General surgery Interventional radiology Gynecology Infectious disease  55 minutes with more than 50% spent in reviewing records, counseling patient/family and coordinating care.   Sch Meds:  Scheduled Meds:  (feeding supplement) PROSource Plus  30 mL Oral BID BM   amoxicillin-clavulanate  1 tablet Oral Q12H   Chlorhexidine Gluconate Cloth  6 each Topical Daily   doxycycline  100 mg Oral Q12H   enoxaparin (LOVENOX) injection  80 mg Subcutaneous Q24H   feeding supplement  1 Container Oral Q24H   folic acid  1 mg Oral Daily   insulin aspart  0-20 Units Subcutaneous Q4H   insulin glargine-yfgn  15 Units Subcutaneous Daily   levothyroxine  50 mcg Oral Q0600   pantoprazole (PROTONIX) IV  40 mg Intravenous Q24H   polyethylene glycol  17 g Oral Daily   QUEtiapine  200 mg Oral QHS   senna-docusate  1 tablet Oral QHS   sodium chloride flush  3 mL Intravenous Q12H   sodium chloride flush  5 mL Intracatheter Q8H   sodium chloride flush  5 mL Intracatheter Q8H   Continuous Infusions:  TPN ADULT (ION) 90 mL/hr at 11/12/22 0358   TPN ADULT (ION)     PRN Meds:.acetaminophen **OR** acetaminophen, bisacodyl, HYDROmorphone (DILAUDID) injection, hydrOXYzine, ondansetron (ZOFRAN) IV, sodium chloride flush  Antimicrobials: Anti-infectives (From admission, onward)    Start     Dose/Rate Route Frequency Ordered Stop   11/11/22 2200  doxycycline (VIBRA-TABS) tablet 100 mg        100 mg Oral Every 12 hours 11/11/22 1212     11/11/22 2000  amoxicillin-clavulanate (AUGMENTIN) 875-125 MG per tablet 1 tablet        1 tablet Oral Every 12 hours 11/11/22 1212     11/07/22 1045  doxycycline (VIBRAMYCIN) 100 mg in sodium chloride 0.9 % 250 mL IVPB  Status:  Discontinued        100 mg 125 mL/hr over 120 Minutes Intravenous Every 12 hours 11/07/22 0957 11/11/22 1212   10/30/22 2000  Ampicillin-Sulbactam (UNASYN) 3 g in  sodium chloride 0.9 % 100 mL IVPB  Status:  Discontinued        3 g 200 mL/hr over 30 Minutes Intravenous Every 6 hours 10/30/22 1530 11/11/22 1212   10/26/22 2200  meropenem (MERREM) 2 g in sodium chloride 0.9 % 100 mL IVPB  Status:  Discontinued        2 g 280 mL/hr over 30 Minutes Intravenous Every 8 hours 10/26/22 1409 10/30/22 1530   10/26/22 1000  fluconazole (DIFLUCAN) tablet 100 mg       See Hyperspace for full Linked Orders Report.   100 mg Oral Daily 10/25/22 1412 10/31/22 1011   10/25/22 2000  doxycycline (VIBRAMYCIN) 100 mg in sodium chloride 0.9 % 250 mL IVPB  Status:  Discontinued        100 mg 125 mL/hr over 120 Minutes Intravenous Every 12 hours 10/25/22 1304 10/30/22 1530   10/25/22 1500  fluconazole (DIFLUCAN) tablet 200 mg       See Hyperspace for full  Linked Orders Report.   200 mg Oral Daily 10/25/22 1412 10/25/22 1718   10/25/22 1400  meropenem (MERREM) 2 g in sodium chloride 0.9 % 100 mL IVPB  Status:  Discontinued        2 g 280 mL/hr over 30 Minutes Intravenous Every 12 hours 10/25/22 1304 10/26/22 1409   10/25/22 1200  doxycycline (VIBRA-TABS) tablet 100 mg  Status:  Discontinued        100 mg Oral Every 12 hours 10/25/22 1103 10/25/22 1109   10/24/22 0045  cefTRIAXone (ROCEPHIN) 2 g in sodium chloride 0.9 % 100 mL IVPB  Status:  Discontinued        2 g 200 mL/hr over 30 Minutes Intravenous Daily at bedtime 10/24/22 0025 10/25/22 1102   10/24/22 0045  metroNIDAZOLE (FLAGYL) IVPB 500 mg  Status:  Discontinued        500 mg 100 mL/hr over 60 Minutes Intravenous 2 times daily 10/24/22 0025 10/25/22 1102   10/24/22 0030  doxycycline (VIBRA-TABS) tablet 100 mg  Status:  Discontinued        100 mg Oral Every 12 hours 10/24/22 0025 10/25/22 1102   10/24/22 0000  piperacillin-tazobactam (ZOSYN) IVPB 3.375 g  Status:  Discontinued        3.375 g 100 mL/hr over 30 Minutes Intravenous  Once 10/23/22 2348 10/24/22 0025        I have personally reviewed the following  labs and images: CBC: Recent Labs  Lab 11/06/22 1123 11/07/22 0455 11/08/22 0800 11/09/22 0557 11/10/22 0617 11/11/22 0346 11/12/22 0332  WBC 13.3* 14.7* 12.6* 11.1* 9.7 8.5 8.9  NEUTROABS 8.6* 8.7* 7.6 6.3  --   --   --   HGB 7.6* 8.9* 7.5* 7.4* 7.3* 7.8* 8.3*  HCT 23.0* 27.0* 22.7* 22.3* 22.6* 23.9* 25.2*  MCV 87.1 87.1 87.6 87.8 89.3 92.6 88.4  PLT 439* 489* 413* 363 316 283 253   BMP &GFR Recent Labs  Lab 11/06/22 0615 11/07/22 0455 11/08/22 0800 11/09/22 0557 11/10/22 0617 11/11/22 1039 11/12/22 0332  NA 134* 134* 134* 134* 135 136 134*  K 4.3 4.2 4.8 4.1 4.0 3.9 4.2  CL 102 103 105 103 102 105 107  CO2 24 25 26 26 25 27 23   GLUCOSE 175* 192* 198* 175* 187* 183* 178*  BUN 9 9 13 11 11 12 12   CREATININE 0.66 0.64 0.74 0.71 0.68 0.72 0.65  CALCIUM 8.4* 8.5* 8.3* 8.3* 8.3* 8.4* 8.0*  MG 1.7 1.8 2.0 1.7 1.7 1.8 1.8  PHOS 4.1 4.4 4.5 4.4 4.1  --   --    Estimated Creatinine Clearance: 136.9 mL/min (by C-G formula based on SCr of 0.65 mg/dL). Liver & Pancreas: Recent Labs  Lab 11/07/22 0455 11/08/22 0800 11/09/22 0557 11/10/22 0617  AST 16 19 19 16   ALT 13 14 16 15   ALKPHOS 105 93 87 79  BILITOT 0.6 0.5 0.5 0.5  PROT 6.1* 5.5* 5.6* 5.4*  ALBUMIN 2.1* 1.9* 1.9* 1.8*   No results for input(s): "LIPASE", "AMYLASE" in the last 168 hours. No results for input(s): "AMMONIA" in the last 168 hours. Diabetic: Recent Labs    11/10/22 0617  HGBA1C 7.4*   Recent Labs  Lab 11/11/22 2022 11/11/22 2352 11/12/22 0501 11/12/22 0801 11/12/22 1206  GLUCAP 223* 192* 170* 184* 183*   Cardiac Enzymes: No results for input(s): "CKTOTAL", "CKMB", "CKMBINDEX", "TROPONINI" in the last 168 hours. No results for input(s): "PROBNP" in the last 8760 hours. Coagulation Profile: No results for  input(s): "INR", "PROTIME" in the last 168 hours. Thyroid Function Tests: No results for input(s): "TSH", "T4TOTAL", "FREET4", "T3FREE", "THYROIDAB" in the last 72 hours. Lipid  Profile: Recent Labs    11/10/22 0617  TRIG 71   Anemia Panel: Recent Labs    11/10/22 0617  VITAMINB12 465  FOLATE 5.0*  FERRITIN 180  TIBC 231*  IRON 36  RETICCTPCT 5.4*   Urine analysis:    Component Value Date/Time   COLORURINE AMBER (A) 10/30/2022 0300   APPEARANCEUR TURBID (A) 10/30/2022 0300   LABSPEC 1.031 (H) 10/30/2022 0300   PHURINE 5.0 10/30/2022 0300   GLUCOSEU NEGATIVE 10/30/2022 0300   HGBUR NEGATIVE 10/30/2022 0300   BILIRUBINUR NEGATIVE 10/30/2022 0300   KETONESUR 5 (A) 10/30/2022 0300   PROTEINUR 30 (A) 10/30/2022 0300   UROBILINOGEN 0.2 02/27/2015 1733   NITRITE NEGATIVE 10/30/2022 0300   LEUKOCYTESUR NEGATIVE 10/30/2022 0300   Sepsis Labs: Invalid input(s): "PROCALCITONIN", "LACTICIDVEN"  Microbiology: Recent Results (from the past 240 hour(s))  Aerobic/Anaerobic Culture w Gram Stain (surgical/deep wound)     Status: None   Collection Time: 11/03/22  1:08 PM   Specimen: Abscess  Result Value Ref Range Status   Specimen Description ABSCESS  Final   Special Requests Normal  Final   Gram Stain   Final    ABUNDANT WBC PRESENT, PREDOMINANTLY PMN NO ORGANISMS SEEN    Culture   Final    No growth aerobically or anaerobically. Performed at [facility], [address] [place], [place] [address]    Report Status 11/08/2022 FINAL  Final    Radiology Studies: No results found.    Ronika Kelson T. Dylann Layne [facility]  If 7PM-7AM, please contact night-coverage [email or web address] 11/12/2022, 2:02 PM

## 2022-11-12 NOTE — Progress Notes (Signed)
PHARMACY - TOTAL PARENTERAL NUTRITION CONSULT NOTE   Indication: Small bowel obstruction and prolonged inability to tolerate PO intake  Patient Measurements: Height: 5\' 8"  (172.7 cm) Weight: (!) 167.6 kg (369 lb 7.9 oz) IBW/kg (Calculated) : 63.9 TPN AdjBW (KG): 86.4 Body mass index is 56.18 kg/m.  Assessment:  52 yo female with PMH of Bell's palsy, DM, CHF, hypothyroidism, hx DVT on Eliquis. Recently discharged on 3/21 after admission for abdominal pain and intractable nausea/vomiting. Brought back to ED on 3/24 for AMS and loss of consciousness. Endorsed abdominal pain and nausea/vomiting on day of admission. Pharmacy has been consulted on 4/3 to begin TPN as patient now with evidence of SBO on imaging and prolonged poor PO intake.   Discussed with patient on 4/3, she states that she really has not been able to tolerate a meal since before her previous admission ~3/16. Has a few juice cups and water cup on her tray, but states she is really only able to drink liquids when she is taking meds. Intake causes her to experience nausea and abdominal pain. Patient stated that just thinking about food/liquid/PO intake during our discussion made her feel nauseous.   Bowel function returned on 4/10. Diet advanced from full liquids to soft diet on 4/12. Pt only eating 10-30% of meals - limited by nausea.    Glucose / Insulin: hx T2DM, last A1c 7.4% 05/2022, on U-500 100 units TID, metformin, Jardiance, and Ozempic PTA >> was started on Semglee 100 units BID here but reduced down to Semglee 10 units QD, increased to 15 u on 4/12 - CBGs 170-223, required 30 units of SSI/24h while on rSSI, 15u insulin in TPN  Electrolytes: Mag 1.8, K: 4.2, Na: 134, CoCa: 9.8, all others WNL  - Goal K >/= 4 and Mag >/= 2 Renal: Scr <1, BUN 12 Hepatic: 4/11: LFTs wnl, 4/11: TG: 71  Intake / Output: UOP 0.4mL/kg/hr, +13L this admission still holding home diuretics, normal volume status, LBM 4/12 (x2 occurrences), PO  intake   GI Imaging: - 3/24 CTAP: inflammatory/infectious process such as PID w/ developing abscess, no bowel obstruction - 3/29 Abd X-ray: multiple dilated air-filled loops of small and large bowel, could represent ileus or developing obstruction - 4/2 CTAP: new loculated rim enhancing fluid collection w/in the RLQ, several loops of distal small bowel, developing mid small bowel obstruction - 4/5 Abd x-ray: persistent dilated loops of small bowel which may reflect ileus or pSBO - 4/6 Abd Xray: no significant change in small bowel dilation - 4/10 Abd Xray: interval improvement in small bowel dilatation GI Surgeries / Procedures:  - 3/26: CT-guided left pelvic abscess drain placement  Central access: 4/3 TPN start date: 4/3  Nutritional Goals: Goal TPN rate is 90 mL/hr (provides 112 g of protein and 1988 kcals per day)  RD Assessment: Estimated Needs Total Energy Estimated Needs: 1900-2100 kcal/d Total Protein Estimated Needs: 100-120 g/d Total Fluid Estimated Needs: 2L/d  Current Nutrition:  TPN Dysphagia 3 diet 4/12 - pt only reports eating ~30% of grits and ~10% of eggs with breakfast and very little on 4/12 Boost x1 on 4/11, refused on 4/12  Plan:  Continue TPN at 14mL/hr at 1800 providing 112g AA and 1988 kcal meeting 100% of nutritional needs.  Electrolytes in TPN: Na increased to 178mEq/L, K 62mEq/L ( total), Ca 70mEq/L, Mg increased to 14 mEq/L, and Phos 63mmol/L. Cl:Ac changed to 1:2.  Add standard MVI and trace elements to TPN Insulin increased to 20 units  in TPN, will trend CBG and SSI requirements with basal insulin per MD.  Give Magnesium sulfate 2g IV x1 in addition to TPN  Continue resistant q4h SSI and adjust as needed Monitor TPN labs tomorrow and on Mon/Thurs F/u ability to tolerate diet and increase PO intake   Wilburn Cornelia, PharmD, BCPS Clinical Pharmacist 11/12/2022 11:31 AM   Please refer to [facility] for pharmacy phone number

## 2022-11-13 DIAGNOSIS — G9340 Encephalopathy, unspecified: Secondary | ICD-10-CM | POA: Diagnosis not present

## 2022-11-13 DIAGNOSIS — R109 Unspecified abdominal pain: Secondary | ICD-10-CM

## 2022-11-13 DIAGNOSIS — G9341 Metabolic encephalopathy: Secondary | ICD-10-CM | POA: Diagnosis not present

## 2022-11-13 DIAGNOSIS — R638 Other symptoms and signs concerning food and fluid intake: Secondary | ICD-10-CM

## 2022-11-13 DIAGNOSIS — N730 Acute parametritis and pelvic cellulitis: Secondary | ICD-10-CM | POA: Diagnosis not present

## 2022-11-13 DIAGNOSIS — R112 Nausea with vomiting, unspecified: Secondary | ICD-10-CM

## 2022-11-13 DIAGNOSIS — R9431 Abnormal electrocardiogram [ECG] [EKG]: Secondary | ICD-10-CM | POA: Diagnosis not present

## 2022-11-13 LAB — BASIC METABOLIC PANEL
Anion gap: 8 (ref 5–15)
BUN: 12 mg/dL (ref 6–20)
CO2: 25 mmol/L (ref 22–32)
Calcium: 8.3 mg/dL — ABNORMAL LOW (ref 8.9–10.3)
Chloride: 103 mmol/L (ref 98–111)
Creatinine, Ser: 0.64 mg/dL (ref 0.44–1.00)
GFR, Estimated: >60 mL/min (ref >60)
Glucose, Bld: 166 mg/dL — ABNORMAL HIGH (ref 70–99)
Potassium: 4 mmol/L (ref 3.5–5.1)
Sodium: 136 mmol/L (ref 135–145)

## 2022-11-13 LAB — PHOSPHORUS: Phosphorus: 4.5 mg/dL (ref 2.5–4.6)

## 2022-11-13 LAB — GLUCOSE, CAPILLARY
Glucose-Capillary: 148 mg/dL — ABNORMAL HIGH (ref 70–99)
Glucose-Capillary: 161 mg/dL — ABNORMAL HIGH (ref 70–99)
Glucose-Capillary: 177 mg/dL — ABNORMAL HIGH (ref 70–99)
Glucose-Capillary: 184 mg/dL — ABNORMAL HIGH (ref 70–99)
Glucose-Capillary: 173 mg/dL — ABNORMAL HIGH (ref 70–99)
Glucose-Capillary: 162 mg/dL — ABNORMAL HIGH (ref 70–99)

## 2022-11-13 LAB — MAGNESIUM: Magnesium: 1.6 mg/dL — ABNORMAL LOW (ref 1.7–2.4)

## 2022-11-13 MED ORDER — TRAVASOL 10 % IV SOLN
INTRAVENOUS | Status: AC
  Filled 2022-11-13: qty 1123.2

## 2022-11-13 MED ORDER — MAGNESIUM SULFATE 2 GM/50ML IV SOLN
2.0000 g | Freq: Once | INTRAVENOUS | Status: AC
  Administered 2022-11-13: 2 g via INTRAVENOUS
  Filled 2022-11-13: qty 50

## 2022-11-13 MED ORDER — LOPERAMIDE HCL 2 MG PO CAPS
2.0000 mg | ORAL_CAPSULE | ORAL | Status: DC | PRN
  Administered 2022-11-13: 2 mg via ORAL
  Filled 2022-11-13: qty 1

## 2022-11-13 NOTE — Progress Notes (Addendum)
PHARMACY - TOTAL PARENTERAL NUTRITION CONSULT NOTE   Indication: Small bowel obstruction and prolonged inability to tolerate PO intake  Patient Measurements: Height: 5\' 8"  (172.7 cm) Weight: (!) 167.6 kg (369 lb 7.9 oz) IBW/kg (Calculated) : 63.9 TPN AdjBW (KG): 86.4 Body mass index is 56.18 kg/m.  Assessment:  52 yo female with PMH of Bell's palsy, DM, CHF, hypothyroidism, hx DVT on Eliquis. Recently discharged on 3/21 after admission for abdominal pain and intractable nausea/vomiting. Brought back to ED on 3/24 for AMS and loss of consciousness. Endorsed abdominal pain and nausea/vomiting on day of admission. Pharmacy has been consulted on 4/3 to begin TPN as patient now with evidence of SBO on imaging and prolonged poor PO intake.   Discussed with patient on 4/3, she states that she really has not been able to tolerate a meal since before her previous admission ~3/16. Has a few juice cups and water cup on her tray, but states she is really only able to drink liquids when she is taking meds. Intake causes her to experience nausea and abdominal pain. Patient stated that just thinking about food/liquid/PO intake during our discussion made her feel nauseous.   Bowel function returned on 4/10. Diet advanced from full liquids to soft diet on 4/12. Pt only eating 10-30% of meals - limited by nausea, especially after PO contrast given for CTAP on 4/13.  Glucose / Insulin: hx T2DM, last A1c 7.4% 05/2022, on U-500 100 units TID, metformin, Jardiance, and Ozempic PTA >> was started on Semglee 100 units BID here but reduced down to Semglee 15 units QD,  - CBGs 148-183, required 23 units of SSI/24h while on rSSI, 20u insulin in TPN  Electrolytes: Mag 1.6, K: 4, CoCa: 10.1, all others WNL  Renal: Scr <1, BUN 12 Hepatic: 4/11: LFTs wnl, 4/11: TG: 71  Intake / Output: UOP 0.59mL/kg/hr, +10L this admission still holding home diuretics, normal volume status, LBM 4/13 (x3 occurrences), 41mL PO intake   GI  Imaging: - 3/24 CTAP: inflammatory/infectious process such as PID w/ developing abscess, no bowel obstruction - 3/29 Abd X-ray: multiple dilated air-filled loops of small and large bowel, could represent ileus or developing obstruction - 4/2 CTAP: new loculated rim enhancing fluid collection w/in the RLQ, several loops of distal small bowel, developing mid small bowel obstruction - 4/5 Abd x-ray: persistent dilated loops of small bowel which may reflect ileus or pSBO - 4/6 Abd Xray: no significant change in small bowel dilation - 4/10 Abd Xray: interval improvement in small bowel dilatation - 4/13 CTAP: results pending GI Surgeries / Procedures:  - 3/26: CT-guided left pelvic abscess drain placement  Central access: 4/3 TPN start date: 4/3  Nutritional Goals: Goal TPN rate is 90 mL/hr (provides 112 g of protein and 1988 kcals per day)  RD Assessment: Estimated Needs Total Energy Estimated Needs: 1900-2100 kcal/d Total Protein Estimated Needs: 100-120 g/d Total Fluid Estimated Needs: 2L/d  Current Nutrition:  TPN Dysphagia 3 diet 4/13 - pt only reports eating ~30% of grits and ~10% of eggs with breakfast and unable to eat after receiving PO contrast on 4/13 Boost x1 on 4/13  Plan:  Continue TPN at 49mL/hr at 1800 providing 112g AA and 1988 kcal meeting 100% of nutritional needs.  Electrolytes in TPN: Na increased to 154mEq/L, K 17mEq/L ( total), Ca 73mEq/L, Mg increased to 14 mEq/L, and Phos 12mmol/L. Cl:Ac changed to 1:2.  Add standard MVI and trace elements to TPN Insulin 20 units added to TPN, will  trend CBG and SSI requirements with basal insulin per MD.  Continue resistant q4h SSI and adjust as needed Magnesium sulfate 2g IV x1 given per MD in addition to TPN  Monitor TPN labs on Mon/Thurs F/u ability to tolerate diet and increase PO intake and wean TPN Per discussion with Dr. Alanda Slim, with return of bowel function, plan to discuss transition to enteral nutrition on 4/15  once side effects from PO contrast resolve   Wilburn Cornelia, PharmD, BCPS Clinical Pharmacist 11/13/2022 10:54 AM   Please refer to [facility] for pharmacy phone number

## 2022-11-13 NOTE — Progress Notes (Signed)
PROGRESS NOTE  [name] KKX:[id number] DOB: [birth year]   PCP: Verlon Au, MD  Patient is from: Home.  DOA: 10/23/2022 LOS: 19  Chief complaints Chief Complaint  Patient presents with   Code Stroke     Brief Narrative / Interim history: 52 year old F with PMH of morbid obesity, combined CHF, RV failure, DVT/PE on prophylactic dose Eliquis, hypothyroidism, anxiety and depression presenting with lightheadedness, abdominal pain and confusion, and admitted for severe sepsis with acute encephalopathy in the setting of intra-abdominal abscess concerning for PID/TOA.  Patient was started on antibiotics.  Gynecology, general surgery and interventional radiology consulted.  Patient had left pelvic drain placed on 3/26.  Fluid culture with Streptococcus anginosus.  Mentation improved.  She developed nausea and vomiting.  Concerned about ileus/SBO on KUB on 3/30.  NG tube placed for SBO protocol but patient was not able to tolerate.  PICC line placed and started on TPN on 4/2.  Repeat CT abdomen and pelvis on 4/2 with complete evacuation of left adnexal fluid collection but persistent poorly loculated fluid within the right adnexa and new loculated rim-enhancing fluid collection within RLQ measuring 3.2 x 7.4 x 5.0 cm, and developing mid SBO with single-point transition.  Patient had RLQ drain placement on 4/4.  Gyn recommended TVUS and doxycycline to treat for possible PID.  TVUS showed small amount of free fluid in pelvis and the ovaries were not visualized.  ID recommended continuing IV Unasyn and transition to p.o. Augmentin when able to tolerate p.o.  Currently on TPN, IV ampicillin and doxycycline.  Advanced diet to soft.  Drain output less than 10 cc from his drain.  CT on 4/13 with resolution of bilateral lower quadrant fluid collection, mild decrease in small bowel dilation and resolution of small right pleural effusion and right basilar atelectasis.   Subjective: Seen and examined  earlier this morning.  Complains nausea, vomiting and diarrhea since she had oral contrast for CT yesterday.  Also reports abdominal cramping with this.  Feels like eating or drinking.  Tach remains poor.  Objective: Vitals:   11/12/22 0500 11/12/22 0640 11/12/22 0702 11/12/22 1054  BP: (!) 89/49 (!) 108/51 125/67 121/60  Pulse: 93  94 97  Resp: 16  15   Temp: 98.4 F (36.9 C)  98.2 F (36.8 C) 98.4 F (36.9 C)  TempSrc: Oral  Oral Oral  SpO2: 97%  98% 98%  Weight:      Height:        Examination:  GENERAL: No apparent distress.  Nontoxic. HEENT: MMM.  Vision and hearing grossly intact.  NECK: Supple.  No apparent JVD.  RESP:  No IWOB.  Fair aeration bilaterally. CVS:  RRR. Heart sounds normal.  ABD/GI/GU: BS+. Abd soft.  Mild tenderness across lower abdomen.  Small purulent fluid from LLQ drain.  MSK/EXT:   No apparent deformity. Moves extremities. No edema.  SKIN: no apparent skin lesion or wound NEURO: Awake and alert. Oriented appropriately.  No apparent focal neuro deficit. PSYCH: Somewhat flat affect.  Procedures:  3/26-CT-guided left pelvic drain 4/4-CT-guided RLQ drain  Microbiology summarized: 3/24-wet prep negative 3/26-drain culture with Streptococcus anginosus 3/26-blood cultures NGTD 3/31-urine culture with insignificant growth 4/4-drain culture negative.  Assessment and plan: Principal Problem:   Septic encephalopathy Active Problems:   Hypertension complicating diabetes   Chronic systolic CHF (congestive heart failure)   SJOGREN'S SYNDROME   QT prolongation   Type 2 diabetes mellitus with hyperlipidemia   History of CVA (cerebrovascular accident)  Mood disorder   SLE (systemic lupus erythematosus related syndrome)   PID (acute pelvic inflammatory disease)   Intra-abdominal abscess   Severe sepsis  Severe sepsis secondary to intra-abdominal abscess concerning for PID/TOA: Met criteria for severe sepsis with fever, leukocytosis, AKI and  encephalopathy. -Imagings as above. -S/p LLQ drain placement on 3/26 and RLQ drain placement on 4/4 -Culture data as above.  -Received different kinds of antibiotics tolerating doxycycline from 3/25 to 4/14 -Discontinue Augmentin and doxycycline. -Repeat CT abdomen and pelvis on 4/13 with resolution of intra-abdominal fluid collection improved ileus. -Drain output minimal.  Will notify IR -OOB/PT/OT  Nausea/vomiting/abdominal pain: After contrast for CT abdomen and pelvis. Persistent ileus: CT with improved ileus.  Having bowel movements.  Decreased oral intake: Likely due to the above. -Discussed risk of IV narcotics around-the-clock.  He is very resistant about decreasing IV Dilaudid.  Has allergy to oxycodone and Norco which limits p.o. option.  Also history of intolerance to Reglan. -Continue antibiotics. -Continue TPN for now.  Will discuss alternate means of feeding -Continue dysphagia-3 diet -PT/OT for mobility  Acute metabolic encephalopathy: Likely due to the above.  Other encephalopathy workup including ammonia, TSH, HIV, RPR, EEG, CT head and VBG without significant finding.  Encephalopathy resolved. -Reorientation and delirium precautions -Minimize or avoid sedating medications-decreasing Dilaudid  Chronic combined CHF with recovered EF: TTE on 3/25 with LVEF of 55 to 60% (30 to 35% in 2022), indeterminate DD and normal RV.  Difficult to assess fluid status due to body habitus.  No significant cardiopulmonary symptoms.  On torsemide 20 mg daily and Jardiance 10 mg daily at home. -Continue monitoring fluid and respiratory status -Continue holding home torsemide and Jardiance.  Normocytic anemia: Hgb drifting down after initial improvement.  No overt bleeding.  Anemia panel with mild folic acid deficiency.  Transfused 1 unit on 4/11 with appropriate response. Recent Labs    11/03/22 1700 11/04/22 0755 11/05/22 0405 11/06/22 1123 11/07/22 0455 11/08/22 0800 11/09/22 0557  11/10/22 0617 11/11/22 0346 11/12/22 0332  HGB 7.4* 7.2* 7.4* 7.6* 8.9* 7.5* 7.4* 7.3* 7.8* 8.3*  -Monitor H&H intermittently -Continue folic acid  Uncontrolled IDDM-2 with hyperglycemia: A1c 7.4% Recent Labs  Lab 11/11/22 2022 11/11/22 2352 11/12/22 0501 11/12/22 0801 11/12/22 1206  GLUCAP 223* 192* 170* 184* 183*  -Continue Semglee 15 units daily -Pharmacy increased TPN insulin as well. -Continue SSI-resistant scale -Further adjustment as appropriate.  History of PE/DVT: On low-dose Eliquis at home. -Continue prophylactic dose Lovenox.  Lupus and Sjogren's Syndrome: On Plaquenil, Benlysta and Humira. -Holding home meds in the setting of active infection  Syncope?  Due to #1.  TTE reassuring. -Treat infection as above  UTI ruled out.   AKI: Resolved.   Gastroparesis?  Intolerant to Reglan. -Continue Zofran.   Hypokalemia/hypomagnesemia -Monitor replenish as appropriate   Mood disorder: Somewhat anxious.  Pharmacy not able to verify home meds. -On Seroquel 200 mg at night.  Seems to be on 300 mg at home -Xanax and Wellbutrin discontinued early in the course   Hypothyroidism -Continue home Synthroid   Overactive bladder: Seems to be stable off home oxybutynin.   GERD -Continue PPI   Diabetic Neuropathy: Lyrica discontinued due to encephalopathy.  Stable.   Hyperlipidemia. -Holding Crestor until off of TPN    Oral candidiasis. -Completed a week of Fluconazole.   Morbid obesity with significant comorbidity as above Body mass index is 56.18 kg/m.  Inadequate oral intake.  Severe malnutrition ruled out Nutrition Problem: Inadequate oral intake  Etiology: poor appetite, nausea Signs/Symptoms: meal completion < 25%, NPO status, per patient/family report Interventions: Refer to RD note for recommendations   DVT prophylaxis:  Place and maintain sequential compression device Start: 10/25/22 1103 On Lovenox.  Code Status: Full code Family Communication:  None at bedside Level of care: Telemetry Medical Status is: Inpatient Remains inpatient appropriate because: Intra-abdominal infection/abscess/ileus/nausea/vomiting   Final disposition: TBD. Consultants:  General surgery Interventional radiology Gynecology Infectious disease  55 minutes with more than 50% spent in reviewing records, counseling patient/family and coordinating care.   Sch Meds:  Scheduled Meds:  (feeding supplement) PROSource Plus  30 mL Oral BID BM   amoxicillin-clavulanate  1 tablet Oral Q12H   Chlorhexidine Gluconate Cloth  6 each Topical Daily   doxycycline  100 mg Oral Q12H   enoxaparin (LOVENOX) injection  80 mg Subcutaneous Q24H   feeding supplement  1 Container Oral Q24H   folic acid  1 mg Oral Daily   insulin aspart  0-20 Units Subcutaneous Q4H   insulin glargine-yfgn  15 Units Subcutaneous Daily   levothyroxine  50 mcg Oral Q0600   pantoprazole (PROTONIX) IV  40 mg Intravenous Q24H   polyethylene glycol  17 g Oral Daily   QUEtiapine  200 mg Oral QHS   senna-docusate  1 tablet Oral QHS   sodium chloride flush  3 mL Intravenous Q12H   sodium chloride flush  5 mL Intracatheter Q8H   sodium chloride flush  5 mL Intracatheter Q8H   Continuous Infusions:  TPN ADULT (ION) 90 mL/hr at 11/12/22 0358   TPN ADULT (ION)     PRN Meds:.acetaminophen **OR** acetaminophen, bisacodyl, HYDROmorphone (DILAUDID) injection, hydrOXYzine, ondansetron (ZOFRAN) IV, sodium chloride flush  Antimicrobials: Anti-infectives (From admission, onward)    Start     Dose/Rate Route Frequency Ordered Stop   11/11/22 2200  doxycycline (VIBRA-TABS) tablet 100 mg        100 mg Oral Every 12 hours 11/11/22 1212     11/11/22 2000  amoxicillin-clavulanate (AUGMENTIN) 875-125 MG per tablet 1 tablet        1 tablet Oral Every 12 hours 11/11/22 1212     11/07/22 1045  doxycycline (VIBRAMYCIN) 100 mg in sodium chloride 0.9 % 250 mL IVPB  Status:  Discontinued        100 mg 125 mL/hr  over 120 Minutes Intravenous Every 12 hours 11/07/22 0957 11/11/22 1212   10/30/22 2000  Ampicillin-Sulbactam (UNASYN) 3 g in sodium chloride 0.9 % 100 mL IVPB  Status:  Discontinued        3 g 200 mL/hr over 30 Minutes Intravenous Every 6 hours 10/30/22 1530 11/11/22 1212   10/26/22 2200  meropenem (MERREM) 2 g in sodium chloride 0.9 % 100 mL IVPB  Status:  Discontinued        2 g 280 mL/hr over 30 Minutes Intravenous Every 8 hours 10/26/22 1409 10/30/22 1530   10/26/22 1000  fluconazole (DIFLUCAN) tablet 100 mg       See Hyperspace for full Linked Orders Report.   100 mg Oral Daily 10/25/22 1412 10/31/22 1011   10/25/22 2000  doxycycline (VIBRAMYCIN) 100 mg in sodium chloride 0.9 % 250 mL IVPB  Status:  Discontinued        100 mg 125 mL/hr over 120 Minutes Intravenous Every 12 hours 10/25/22 1304 10/30/22 1530   10/25/22 1500  fluconazole (DIFLUCAN) tablet 200 mg       See Hyperspace for full Linked Orders Report.   200  mg Oral Daily 10/25/22 1412 10/25/22 1718   10/25/22 1400  meropenem (MERREM) 2 g in sodium chloride 0.9 % 100 mL IVPB  Status:  Discontinued        2 g 280 mL/hr over 30 Minutes Intravenous Every 12 hours 10/25/22 1304 10/26/22 1409   10/25/22 1200  doxycycline (VIBRA-TABS) tablet 100 mg  Status:  Discontinued        100 mg Oral Every 12 hours 10/25/22 1103 10/25/22 1109   10/24/22 0045  cefTRIAXone (ROCEPHIN) 2 g in sodium chloride 0.9 % 100 mL IVPB  Status:  Discontinued        2 g 200 mL/hr over 30 Minutes Intravenous Daily at bedtime 10/24/22 0025 10/25/22 1102   10/24/22 0045  metroNIDAZOLE (FLAGYL) IVPB 500 mg  Status:  Discontinued        500 mg 100 mL/hr over 60 Minutes Intravenous 2 times daily 10/24/22 0025 10/25/22 1102   10/24/22 0030  doxycycline (VIBRA-TABS) tablet 100 mg  Status:  Discontinued        100 mg Oral Every 12 hours 10/24/22 0025 10/25/22 1102   10/24/22 0000  piperacillin-tazobactam (ZOSYN) IVPB 3.375 g  Status:  Discontinued        3.375  g 100 mL/hr over 30 Minutes Intravenous  Once 10/23/22 2348 10/24/22 0025        I have personally reviewed the following labs and images: CBC: Recent Labs  Lab 11/06/22 1123 11/07/22 0455 11/08/22 0800 11/09/22 0557 11/10/22 0617 11/11/22 0346 11/12/22 0332  WBC 13.3* 14.7* 12.6* 11.1* 9.7 8.5 8.9  NEUTROABS 8.6* 8.7* 7.6 6.3  --   --   --   HGB 7.6* 8.9* 7.5* 7.4* 7.3* 7.8* 8.3*  HCT 23.0* 27.0* 22.7* 22.3* 22.6* 23.9* 25.2*  MCV 87.1 87.1 87.6 87.8 89.3 92.6 88.4  PLT 439* 489* 413* 363 316 283 253   BMP &GFR Recent Labs  Lab 11/06/22 0615 11/07/22 0455 11/08/22 0800 11/09/22 0557 11/10/22 0617 11/11/22 1039 11/12/22 0332  NA 134* 134* 134* 134* 135 136 134*  K 4.3 4.2 4.8 4.1 4.0 3.9 4.2  CL 102 103 105 103 102 105 107  CO2 24 25 26 26 25 27 23   GLUCOSE 175* 192* 198* 175* 187* 183* 178*  BUN 9 9 13 11 11 12 12   CREATININE 0.66 0.64 0.74 0.71 0.68 0.72 0.65  CALCIUM 8.4* 8.5* 8.3* 8.3* 8.3* 8.4* 8.0*  MG 1.7 1.8 2.0 1.7 1.7 1.8 1.8  PHOS 4.1 4.4 4.5 4.4 4.1  --   --    Estimated Creatinine Clearance: 136.9 mL/min (by C-G formula based on SCr of 0.65 mg/dL). Liver & Pancreas: Recent Labs  Lab 11/07/22 0455 11/08/22 0800 11/09/22 0557 11/10/22 0617  AST 16 19 19 16   ALT 13 14 16 15   ALKPHOS 105 93 87 79  BILITOT 0.6 0.5 0.5 0.5  PROT 6.1* 5.5* 5.6* 5.4*  ALBUMIN 2.1* 1.9* 1.9* 1.8*   No results for input(s): "LIPASE", "AMYLASE" in the last 168 hours. No results for input(s): "AMMONIA" in the last 168 hours. Diabetic: Recent Labs    11/10/22 0617  HGBA1C 7.4*   Recent Labs  Lab 11/11/22 2022 11/11/22 2352 11/12/22 0501 11/12/22 0801 11/12/22 1206  GLUCAP 223* 192* 170* 184* 183*   Cardiac Enzymes: No results for input(s): "CKTOTAL", "CKMB", "CKMBINDEX", "TROPONINI" in the last 168 hours. No results for input(s): "PROBNP" in the last 8760 hours. Coagulation Profile: No results for input(s): "INR", "PROTIME" in the last  168  hours. Thyroid Function Tests: No results for input(s): "TSH", "T4TOTAL", "FREET4", "T3FREE", "THYROIDAB" in the last 72 hours. Lipid Profile: Recent Labs    11/10/22 0617  TRIG 71   Anemia Panel: Recent Labs    11/10/22 0617  VITAMINB12 465  FOLATE 5.0*  FERRITIN 180  TIBC 231*  IRON 36  RETICCTPCT 5.4*   Urine analysis:    Component Value Date/Time   COLORURINE AMBER (A) 10/30/2022 0300   APPEARANCEUR TURBID (A) 10/30/2022 0300   LABSPEC 1.031 (H) 10/30/2022 0300   PHURINE 5.0 10/30/2022 0300   GLUCOSEU NEGATIVE 10/30/2022 0300   HGBUR NEGATIVE 10/30/2022 0300   BILIRUBINUR NEGATIVE 10/30/2022 0300   KETONESUR 5 (A) 10/30/2022 0300   PROTEINUR 30 (A) 10/30/2022 0300   UROBILINOGEN 0.2 02/27/2015 1733   NITRITE NEGATIVE 10/30/2022 0300   LEUKOCYTESUR NEGATIVE 10/30/2022 0300   Sepsis Labs: Invalid input(s): "PROCALCITONIN", "LACTICIDVEN"  Microbiology: Recent Results (from the past 240 hour(s))  Aerobic/Anaerobic Culture w Gram Stain (surgical/deep wound)     Status: None   Collection Time: 11/03/22  1:08 PM   Specimen: Abscess  Result Value Ref Range Status   Specimen Description ABSCESS  Final   Special Requests Normal  Final   Gram Stain   Final    ABUNDANT WBC PRESENT, PREDOMINANTLY PMN NO ORGANISMS SEEN    Culture   Final    No growth aerobically or anaerobically. Performed at [facility], [address], [place], [place] [address]    Report Status 11/08/2022 FINAL  Final    Radiology Studies: No results found.    Marye Eagen T. Ravindra Baranek [facility]  If 7PM-7AM, please contact night-coverage [email or web address] 11/12/2022, 2:02 PM

## 2022-11-13 NOTE — Plan of Care (Signed)

## 2022-11-14 ENCOUNTER — Ambulatory Visit: Payer: Medicaid Other | Admitting: Cardiology

## 2022-11-14 DIAGNOSIS — G9340 Encephalopathy, unspecified: Secondary | ICD-10-CM | POA: Diagnosis not present

## 2022-11-14 DIAGNOSIS — R9431 Abnormal electrocardiogram [ECG] [EKG]: Secondary | ICD-10-CM | POA: Diagnosis not present

## 2022-11-14 DIAGNOSIS — N730 Acute parametritis and pelvic cellulitis: Secondary | ICD-10-CM | POA: Diagnosis not present

## 2022-11-14 DIAGNOSIS — G9341 Metabolic encephalopathy: Secondary | ICD-10-CM | POA: Diagnosis not present

## 2022-11-14 LAB — GLUCOSE, CAPILLARY
Glucose-Capillary: 153 mg/dL — ABNORMAL HIGH (ref 70–99)
Glucose-Capillary: 165 mg/dL — ABNORMAL HIGH (ref 70–99)
Glucose-Capillary: 181 mg/dL — ABNORMAL HIGH (ref 70–99)
Glucose-Capillary: 156 mg/dL — ABNORMAL HIGH (ref 70–99)
Glucose-Capillary: 131 mg/dL — ABNORMAL HIGH (ref 70–99)

## 2022-11-14 LAB — COMPREHENSIVE METABOLIC PANEL
ALT: 14 U/L (ref 0–44)
AST: 14 U/L — ABNORMAL LOW (ref 15–41)
Albumin: 2 g/dL — ABNORMAL LOW (ref 3.5–5.0)
Alkaline Phosphatase: 76 U/L (ref 38–126)
Anion gap: 8 (ref 5–15)
BUN: 10 mg/dL (ref 6–20)
CO2: 26 mmol/L (ref 22–32)
Calcium: 8.3 mg/dL — ABNORMAL LOW (ref 8.9–10.3)
Chloride: 105 mmol/L (ref 98–111)
Creatinine, Ser: 0.7 mg/dL (ref 0.44–1.00)
GFR, Estimated: >60 mL/min (ref >60)
Glucose, Bld: 154 mg/dL — ABNORMAL HIGH (ref 70–99)
Potassium: 3.8 mmol/L (ref 3.5–5.1)
Sodium: 139 mmol/L (ref 135–145)
Total Bilirubin: 0.3 mg/dL (ref 0.3–1.2)
Total Protein: 5.5 g/dL — ABNORMAL LOW (ref 6.5–8.1)

## 2022-11-14 LAB — TRIGLYCERIDES: Triglycerides: 75 mg/dL (ref <150)

## 2022-11-14 LAB — CBC
HCT: 24.8 % — ABNORMAL LOW (ref 36.0–46.0)
Hemoglobin: 7.9 g/dL — ABNORMAL LOW (ref 12.0–15.0)
MCH: 28.5 pg (ref 26.0–34.0)
MCHC: 31.9 g/dL (ref 30.0–36.0)
MCV: 89.5 fL (ref 80.0–100.0)
Platelets: 216 10*3/uL (ref 150–400)
RBC: 2.77 MIL/uL — ABNORMAL LOW (ref 3.87–5.11)
RDW: 15.9 % — ABNORMAL HIGH (ref 11.5–15.5)
WBC: 8.8 10*3/uL (ref 4.0–10.5)
nRBC: 0 % (ref 0.0–0.2)

## 2022-11-14 LAB — MAGNESIUM: Magnesium: 1.7 mg/dL (ref 1.7–2.4)

## 2022-11-14 LAB — PHOSPHORUS: Phosphorus: 4.3 mg/dL (ref 2.5–4.6)

## 2022-11-14 MED ORDER — MAGNESIUM SULFATE 2 GM/50ML IV SOLN
2.0000 g | Freq: Once | INTRAVENOUS | Status: AC
  Administered 2022-11-14: 2 g via INTRAVENOUS
  Filled 2022-11-14: qty 50

## 2022-11-14 MED ORDER — BOOST / RESOURCE BREEZE PO LIQD CUSTOM
1.0000 | Freq: Two times a day (BID) | ORAL | Status: DC
  Administered 2022-11-17: 1 via ORAL

## 2022-11-14 MED ORDER — TRAVASOL 10 % IV SOLN
INTRAVENOUS | Status: AC
  Filled 2022-11-14: qty 1123.2

## 2022-11-14 MED ORDER — POTASSIUM CHLORIDE 10 MEQ/100ML IV SOLN
10.0000 meq | INTRAVENOUS | Status: AC
  Administered 2022-11-14 (×3): 10 meq via INTRAVENOUS
  Filled 2022-11-14 (×3): qty 100

## 2022-11-14 NOTE — Progress Notes (Signed)
Physical Therapy Treatment Patient Details Name: [name] MRN: [id number] DOB: 1971-06-05 Today's Date: 11/14/2022   History of Present Illness Pt is a 52 y.o. F who presents 10/23/2022 with acute metabolic encephalopathy and severe sepsis secondary to intra-abdominal abscess. S/p drain placement 3/26. 3/30 x-ray showing SBO vs ileus. Significant PMH:  morbid obesity with BMI greater than 50, systolic/diastolic CHF with previous ejection fraction of 30 to 35% and grade 1 diastolic dysfunction, chronic right ventricular failure, history of DVT, hypothyroidism, depression and anxiety and chronic abdominal pain.    PT Comments    Pt progressing well towards her physical therapy goals, exhibiting improved strength, activity tolerance and ambulation distance. Pt ambulating 30 ft with a walker at a min guard assist level. Able to complete serial sit to stands for functional strengthening. Would need to make continued progress to d/c home I.e. walking longer distances and negotiating stairs. She also lives alone and would need to confirm aide assist. Currently, still recommend post acute rehab.   Recommendations for follow up therapy are one component of a multi-disciplinary discharge planning process, led by the attending physician.  Recommendations may be updated based on patient status, additional functional criteria and insurance authorization.  Follow Up Recommendations  Can patient physically be transported by private vehicle: No    Assistance Recommended at Discharge PRN  Patient can return home with the following Assistance with cooking/housework;Assist for transportation;Help with stairs or ramp for entrance;A little help with walking and/or transfers;A little help with bathing/dressing/bathroom   Equipment Recommendations  Hospital bed    Recommendations for Other Services       Precautions / Restrictions Precautions Precautions: Fall;Other (comment) Precaution Comments:  bilateral drains Restrictions Weight Bearing Restrictions: No     Mobility  Bed Mobility Overal bed mobility: Needs Assistance Bed Mobility: Supine to Sit     Supine to sit: HOB elevated, Supervision Sit to supine: Supervision   General bed mobility comments: increased time needed    Transfers Overall transfer level: Needs assistance Equipment used: Rolling walker (2 wheels) Transfers: Sit to/from Stand Sit to Stand: Min guard           General transfer comment: Improved ease of transfer, min guard for safety    Ambulation/Gait Ambulation/Gait assistance: +2 safety/equipment, Min guard Gait Distance (Feet): 30 Feet Assistive device: Rolling walker (2 wheels) (bari) Gait Pattern/deviations: Decreased stride length, Trunk flexed, Wide base of support Gait velocity: decreased     General Gait Details: Improved knee stability, slight increase in trunk flexion, slow and steady pace. Min guard for safety, +2 equipment   Stairs             Wheelchair Mobility    Modified Rankin (Stroke Patients Only)       Balance Overall balance assessment: Needs assistance Sitting-balance support: Feet supported, Single extremity supported Sitting balance-Leahy Scale: Fair Sitting balance - Comments: Prefers UE support   Standing balance support: During functional activity, Bilateral upper extremity supported Standing balance-Leahy Scale: Poor Standing balance comment: reliant on Bil UE support                            Cognition Arousal/Alertness: Awake/alert Behavior During Therapy: WFL for tasks assessed/performed Overall Cognitive Status: Within Functional Limits for tasks assessed  Exercises Other Exercises Other Exercises: x5 sit to stands    General Comments        Pertinent Vitals/Pain Pain Assessment Pain Assessment: Faces Faces Pain Scale: Hurts a little bit Pain Location:  drain location, abdomen Pain Descriptors / Indicators: Tightness, Guarding, Discomfort, Grimacing, Sore Pain Intervention(s): Monitored during session    Home Living                          Prior Function            PT Goals (current goals can now be found in the care plan section) Acute Rehab PT Goals Patient Stated Goal: less pain PT Goal Formulation: With patient Time For Goal Achievement: 11/15/22 Potential to Achieve Goals: Fair Progress towards PT goals: Progressing toward goals    Frequency    Min 3X/week      PT Plan Current plan remains appropriate    Co-evaluation              AM-PAC PT "6 Clicks" Mobility   Outcome Measure  Help needed turning from your back to your side while in a flat bed without using bedrails?: A Little Help needed moving from lying on your back to sitting on the side of a flat bed without using bedrails?: A Little Help needed moving to and from a bed to a chair (including a wheelchair)?: A Little Help needed standing up from a chair using your arms (e.g., wheelchair or bedside chair)?: A Little Help needed to walk in hospital room?: A Little Help needed climbing 3-5 steps with a railing? : A Lot 6 Click Score: 17    End of Session Equipment Utilized During Treatment: Gait belt Activity Tolerance: Patient tolerated treatment well Patient left: with call bell/phone within reach;in bed Nurse Communication: Mobility status PT Visit Diagnosis: Muscle weakness (generalized) (M62.81);Difficulty in walking, not elsewhere classified (R26.2);Pain;Unsteadiness on feet (R26.81);Other abnormalities of gait and mobility (R26.89) Pain - part of body:  (abdomen)     Time: 7078-6754 PT Time Calculation (min) (ACUTE ONLY): 17 min  Charges:  $Therapeutic Activity: 8-22 mins                     Lillia Pauls, PT, DPT Acute Rehabilitation Services Office [phone]    Norval Morton 11/14/2022, 5:02 PM

## 2022-11-14 NOTE — Progress Notes (Signed)
PROGRESS NOTE  [name] [name] DOB: [birth year]   PCP: [name] Au, MD  Patient is from: Home.  DOA: 10/23/2022 LOS: 21  Chief complaints Chief Complaint  Patient presents with   Code Stroke     Brief Narrative / Interim history: 52 year old F with PMH of morbid obesity, combined CHF, RV failure, DVT/PE on prophylactic dose Eliquis, hypothyroidism, anxiety and depression presenting with lightheadedness, abdominal pain and confusion, and admitted for severe sepsis with acute encephalopathy in the setting of intra-abdominal abscess concerning for PID/TOA.  Patient was started on antibiotics.  Gynecology, general surgery and interventional radiology consulted.  Patient had left pelvic drain placed on 3/26.  Fluid culture with Streptococcus anginosus.  Mentation improved.  She developed nausea and vomiting.  Concerned about ileus/SBO on KUB on 3/30.  NG tube placed for SBO protocol but patient was not able to tolerate.  PICC line placed and started on TPN on 4/2.  Repeat CT abdomen and pelvis on 4/2 with complete evacuation of left adnexal fluid collection but persistent poorly loculated fluid within the right adnexa and new loculated rim-enhancing fluid collection within RLQ measuring 3.2 x 7.4 x 5.0 cm, and developing mid SBO with single-point transition.  Patient had RLQ drain placement on 4/4.  Gyn recommended TVUS and doxycycline to treat for possible PID.  TVUS showed small amount of free fluid in pelvis and the ovaries were not visualized.  ID recommended continuing IV Unasyn and transition to p.o. Augmentin when able to tolerate p.o.  Currently on TPN, IV ampicillin and doxycycline.  Advanced diet to soft.  Drain output less than 10 cc from his drain.  CT on 4/13 with resolution of bilateral lower quadrant fluid collection, mild decrease in small bowel dilation and resolution of small right pleural effusion and right basilar atelectasis.  IR notified about CT finding.    Subjective: Seen and examined earlier this morning.  Feels better today.  No further emesis.  Still with nausea and some loose stool.  Abdominal pain improved.  Discussed about saline finding including possible ileus and risk and benefit of opiate use.  I have encouraged her to minimize use of Dilaudid.  We also discussed about feeding tube if her p.o. intake does not improve.  She likes to eat more today.  Dietitian on board.  Objective: Vitals:   11/14/22 0326 11/14/22 0340 11/14/22 0755 11/14/22 1228  BP: (!) 154/100  137/71 (!) 149/78  Pulse: (!) 110  96 100  Resp: 20  (!) 21 (!) 21  Temp: 98.6 F (37 C)  99 F (37.2 C) 98.8 F (37.1 C)  TempSrc: Oral  Oral Oral  SpO2: 100%  99% 98%  Weight:  (!) 168.8 kg    Height:        Examination:  GENERAL: No apparent distress.  Nontoxic. HEENT: MMM.  Vision and hearing grossly intact.  NECK: Supple.  No apparent JVD.  RESP:  No IWOB.  Fair aeration bilaterally. CVS:  RRR. Heart sounds normal.  ABD/GI/GU: BS+. Abd soft.  Mild TTP across lower abdomen.  Small purulent fluid from LLQ drain.  Right JP empty MSK/EXT:   No apparent deformity. Moves extremities. No edema.  SKIN: no apparent skin lesion or wound NEURO: Awake and alert. Oriented appropriately.  No apparent focal neuro deficit. PSYCH: Calm. Normal affect.   Procedures:  3/26-CT-guided left pelvic drain 4/4-CT-guided RLQ drain  Microbiology summarized: 3/24-wet prep negative 3/26-drain culture with Streptococcus anginosus 3/26-blood cultures NGTD 3/31-urine culture with  insignificant growth 4/4-drain culture negative.  Assessment and plan: Principal Problem:   Septic encephalopathy Active Problems:   Hypertension complicating diabetes   Chronic systolic CHF (congestive heart failure)   SJOGREN'S SYNDROME   QT prolongation   Type 2 diabetes mellitus with hyperlipidemia   History of CVA (cerebrovascular accident)   Mood disorder   SLE (systemic lupus  erythematosus related syndrome)   PID (acute pelvic inflammatory disease)   Intra-abdominal abscess   Severe sepsis  Severe sepsis secondary to intra-abdominal abscess concerning for PID/TOA: Met criteria for severe sepsis with fever, leukocytosis, AKI and encephalopathy. -Imagings as above. -S/p LLQ drain placement on 3/26 and RLQ drain placement on 4/4 -Culture data as above.  -Received different kinds of antibiotics tolerating doxycycline from 3/25 to 4/14 -Discontinue Augmentin and doxycycline. -Repeat CT abdomen and pelvis on 4/13 with resolution of intra-abdominal fluid collection improved ileus. -Drain output minimal.  IR notified. -OOB/PT/OT  Nausea/vomiting/abdominal pain: After contrast for CT abdomen and pelvis.  Improved. Persistent ileus: CT with improved ileus.  Having bowel movements.  Decreased oral intake: Encourage oral intake.  -Encouraged her to cut down on IV narcotic use. -Continue TPN for now.  Discussed about tube feed if p.o. intake does not improve.  Dietitian following -Continue dysphagia-3 diet -PT/OT for mobility  Acute metabolic encephalopathy: Likely due to the above.  Other encephalopathy workup including ammonia, TSH, HIV, RPR, EEG, CT head and VBG without significant finding.  Encephalopathy resolved. -Reorientation and delirium precautions -Minimize or avoid sedating medications-decreasing Dilaudid  Chronic combined CHF with recovered EF: TTE on 3/25 with LVEF of 55 to 60% (30 to 35% in 2022), indeterminate DD and normal RV.  Difficult to assess fluid status due to body habitus.  No significant cardiopulmonary symptoms.  On torsemide 20 mg daily and Jardiance 10 mg daily at home. -Continue monitoring fluid and respiratory status -Continue holding home torsemide and Jardiance.  Normocytic anemia: Hgb drifting down after initial improvement.  No overt bleeding.  Anemia panel with mild folic acid deficiency.  Transfused 1 unit on 4/11 with appropriate  response. Recent Labs    11/04/22 0755 11/05/22 0405 11/06/22 1123 11/07/22 0455 11/08/22 0800 11/09/22 0557 11/10/22 0617 11/11/22 0346 11/12/22 0332 11/14/22 0615  HGB 7.2* 7.4* 7.6* 8.9* 7.5* 7.4* 7.3* 7.8* 8.3* 7.9*  -Monitor H&H intermittently -Continue folic acid  Uncontrolled IDDM-2 with hyperglycemia: A1c 7.4% Recent Labs  Lab 11/13/22 2000 11/13/22 2326 11/14/22 0324 11/14/22 0758 11/14/22 1231  GLUCAP 173* 162* 153* 165* 181*  -Continue Semglee 15 units daily -Pharmacy adjusting insulin in TPN. -Continue SSI-resistant scale -Further adjustment as appropriate.  History of PE/DVT: On low-dose Eliquis at home. -Continue prophylactic dose Lovenox.  Lupus and Sjogren's Syndrome: On Plaquenil, Benlysta and Humira. -Holding home meds in the setting of active infection  Syncope?  Due to #1.  TTE reassuring. -Treat infection as above  UTI ruled out.   AKI: Resolved.   Gastroparesis?  Intolerant to Reglan. -Continue Zofran.   Hypokalemia/hypomagnesemia -Monitor replenish as appropriate   Mood disorder: Somewhat anxious.  Pharmacy not able to verify home meds. -On Seroquel 200 mg at night.  Seems to be on 300 mg at home -Xanax and Wellbutrin discontinued early in the course   Hypothyroidism -Continue home Synthroid   Overactive bladder: Seems to be stable off home oxybutynin.   GERD -Continue PPI   Diabetic Neuropathy: Lyrica discontinued due to encephalopathy.  Stable.   Hyperlipidemia. -Holding Crestor until off of TPN    Oral  candidiasis. -Completed a week of Fluconazole.   Morbid obesity with significant comorbidity as above Body mass index is 56.58 kg/m.  Inadequate oral intake.  Severe malnutrition ruled out Nutrition Problem: Inadequate oral intake Etiology: poor appetite, nausea Signs/Symptoms: meal completion < 25%, NPO status, per patient/family report Interventions: Refer to RD note for recommendations   DVT prophylaxis:   Place and maintain sequential compression device Start: 10/25/22 1103 On Lovenox.  Code Status: Full code Family Communication: None at bedside Level of care: Telemetry Medical Status is: Inpatient Remains inpatient appropriate because: Intra-abdominal infection, poor p.o. intake and TPN dependence   Final disposition: TBD. Consultants:  General surgery Interventional radiology Gynecology Infectious disease  55 minutes with more than 50% spent in reviewing records, counseling patient/family and coordinating care.   Sch Meds:  Scheduled Meds:  (feeding supplement) PROSource Plus  30 mL Oral BID BM   Chlorhexidine Gluconate Cloth  6 each Topical Daily   enoxaparin (LOVENOX) injection  80 mg Subcutaneous Q24H   feeding supplement  1 Container Oral BID BM   folic acid  1 mg Oral Daily   insulin aspart  0-20 Units Subcutaneous Q4H   insulin glargine-yfgn  15 Units Subcutaneous Daily   levothyroxine  50 mcg Oral Q0600   pantoprazole (PROTONIX) IV  40 mg Intravenous Q24H   polyethylene glycol  17 g Oral Daily   QUEtiapine  200 mg Oral QHS   senna-docusate  1 tablet Oral QHS   sodium chloride flush  3 mL Intravenous Q12H   sodium chloride flush  5 mL Intracatheter Q8H   sodium chloride flush  5 mL Intracatheter Q8H   Continuous Infusions:  TPN ADULT (ION) 90 mL/hr at 11/14/22 0511   TPN ADULT (ION)     PRN Meds:.acetaminophen **OR** acetaminophen, bisacodyl, HYDROmorphone (DILAUDID) injection, hydrOXYzine, loperamide, ondansetron (ZOFRAN) IV, sodium chloride flush  Antimicrobials: Anti-infectives (From admission, onward)    Start     Dose/Rate Route Frequency Ordered Stop   11/11/22 2200  doxycycline (VIBRA-TABS) tablet 100 mg  Status:  Discontinued        100 mg Oral Every 12 hours 11/11/22 1212 11/13/22 1327   11/11/22 2000  amoxicillin-clavulanate (AUGMENTIN) 875-125 MG per tablet 1 tablet  Status:  Discontinued        1 tablet Oral Every 12 hours 11/11/22 1212  11/13/22 1327   11/07/22 1045  doxycycline (VIBRAMYCIN) 100 mg in sodium chloride 0.9 % 250 mL IVPB  Status:  Discontinued        100 mg 125 mL/hr over 120 Minutes Intravenous Every 12 hours 11/07/22 0957 11/11/22 1212   10/30/22 2000  Ampicillin-Sulbactam (UNASYN) 3 g in sodium chloride 0.9 % 100 mL IVPB  Status:  Discontinued        3 g 200 mL/hr over 30 Minutes Intravenous Every 6 hours 10/30/22 1530 11/11/22 1212   10/26/22 2200  meropenem (MERREM) 2 g in sodium chloride 0.9 % 100 mL IVPB  Status:  Discontinued        2 g 280 mL/hr over 30 Minutes Intravenous Every 8 hours 10/26/22 1409 10/30/22 1530   10/26/22 1000  fluconazole (DIFLUCAN) tablet 100 mg       See Hyperspace for full Linked Orders Report.   100 mg Oral Daily 10/25/22 1412 10/31/22 1011   10/25/22 2000  doxycycline (VIBRAMYCIN) 100 mg in sodium chloride 0.9 % 250 mL IVPB  Status:  Discontinued        100 mg 125 mL/hr over 120  Minutes Intravenous Every 12 hours 10/25/22 1304 10/30/22 1530   10/25/22 1500  fluconazole (DIFLUCAN) tablet 200 mg       See Hyperspace for full Linked Orders Report.   200 mg Oral Daily 10/25/22 1412 10/25/22 1718   10/25/22 1400  meropenem (MERREM) 2 g in sodium chloride 0.9 % 100 mL IVPB  Status:  Discontinued        2 g 280 mL/hr over 30 Minutes Intravenous Every 12 hours 10/25/22 1304 10/26/22 1409   10/25/22 1200  doxycycline (VIBRA-TABS) tablet 100 mg  Status:  Discontinued        100 mg Oral Every 12 hours 10/25/22 1103 10/25/22 1109   10/24/22 0045  cefTRIAXone (ROCEPHIN) 2 g in sodium chloride 0.9 % 100 mL IVPB  Status:  Discontinued        2 g 200 mL/hr over 30 Minutes Intravenous Daily at bedtime 10/24/22 0025 10/25/22 1102   10/24/22 0045  metroNIDAZOLE (FLAGYL) IVPB 500 mg  Status:  Discontinued        500 mg 100 mL/hr over 60 Minutes Intravenous 2 times daily 10/24/22 0025 10/25/22 1102   10/24/22 0030  doxycycline (VIBRA-TABS) tablet 100 mg  Status:  Discontinued        100 mg  Oral Every 12 hours 10/24/22 0025 10/25/22 1102   10/24/22 0000  piperacillin-tazobactam (ZOSYN) IVPB 3.375 g  Status:  Discontinued        3.375 g 100 mL/hr over 30 Minutes Intravenous  Once 10/23/22 2348 10/24/22 0025        I have personally reviewed the following labs and images: CBC: Recent Labs  Lab 11/08/22 0800 11/09/22 0557 11/10/22 0617 11/11/22 0346 11/12/22 0332 11/14/22 0615  WBC 12.6* 11.1* 9.7 8.5 8.9 8.8  NEUTROABS 7.6 6.3  --   --   --   --   HGB 7.5* 7.4* 7.3* 7.8* 8.3* 7.9*  HCT 22.7* 22.3* 22.6* 23.9* 25.2* 24.8*  MCV 87.6 87.8 89.3 92.6 88.4 89.5  PLT 413* 363 316 283 253 216   BMP &GFR Recent Labs  Lab 11/08/22 0800 11/09/22 0557 11/10/22 0617 11/11/22 1039 11/12/22 0332 11/13/22 0534 11/14/22 0615  NA 134* 134* 135 136 134* 136 139  K 4.8 4.1 4.0 3.9 4.2 4.0 3.8  CL 105 103 102 105 107 103 105  CO2 GLUCOSE 198* 175* 187* 183* 178* 166* 154*  BUN CREATININE 0.74 0.71 0.68 0.72 0.65 0.64 0.70  CALCIUM 8.3* 8.3* 8.3* 8.4* 8.0* 8.3* 8.3*  MG 2.0 1.7 1.7 1.8 1.8 1.6* 1.7  PHOS 4.5 4.4 4.1  --   --  4.5 4.3   Estimated Creatinine Clearance: 137.5 mL/min (by C-G formula based on SCr of 0.7 mg/dL). Liver & Pancreas: Recent Labs  Lab 11/08/22 0800 11/09/22 0557 11/10/22 0617 11/14/22 0615  AST 14*  ALT ALKPHOS 93 87 79 76  BILITOT 0.5 0.5 0.5 0.3  PROT 5.5* 5.6* 5.4* 5.5*  ALBUMIN 1.9* 1.9* 1.8* 2.0*   No results for input(s): "LIPASE", "AMYLASE" in the last 168 hours. No results for input(s): "AMMONIA" in the last 168 hours. Diabetic: No results for input(s): "HGBA1C" in the last 72 hours.  Recent Labs  Lab 11/13/22 2000 11/13/22 2326 11/14/22 0324 11/14/22 0758 11/14/22 1231  GLUCAP 173* 162* 153* 165* 181*   Cardiac Enzymes: No results for input(s): "CKTOTAL", "CKMB", "  CKMBINDEX", "TROPONINI" in the last 168 hours. No results for input(s): "PROBNP" in the  last 8760 hours. Coagulation Profile: No results for input(s): "INR", "PROTIME" in the last 168 hours. Thyroid Function Tests: No results for input(s): "TSH", "T4TOTAL", "FREET4", "T3FREE", "THYROIDAB" in the last 72 hours. Lipid Profile: Recent Labs    11/14/22 0615  TRIG 75   Anemia Panel: No results for input(s): "VITAMINB12", "FOLATE", "FERRITIN", "TIBC", "IRON", "RETICCTPCT" in the last 72 hours.  Urine analysis:    Component Value Date/Time   COLORURINE AMBER (A) 10/30/2022 0300   APPEARANCEUR TURBID (A) 10/30/2022 0300   LABSPEC 1.031 (H) 10/30/2022 0300   PHURINE 5.0 10/30/2022 0300   GLUCOSEU NEGATIVE 10/30/2022 0300   HGBUR NEGATIVE 10/30/2022 0300   BILIRUBINUR NEGATIVE 10/30/2022 0300   KETONESUR 5 (A) 10/30/2022 0300   PROTEINUR 30 (A) 10/30/2022 0300   UROBILINOGEN 0.2 02/27/2015 1733   NITRITE NEGATIVE 10/30/2022 0300   LEUKOCYTESUR NEGATIVE 10/30/2022 0300   Sepsis Labs: Invalid input(s): "PROCALCITONIN", "LACTICIDVEN"  Microbiology: No results found for this or any previous visit (from the past 240 hour(s)).   Radiology Studies: No results found.    Keilana Morlock T. Clayton Bosserman [facility]  If 7PM-7AM, please contact night-coverage [email or web address] 11/14/2022, 1:54 PM

## 2022-11-14 NOTE — Progress Notes (Signed)
PHARMACY - TOTAL PARENTERAL NUTRITION CONSULT NOTE   Indication: Small bowel obstruction and prolonged inability to tolerate PO intake  Patient Measurements: Height: 5\' 8"  (172.7 cm) Weight: (!) 168.8 kg (372 lb 2.2 oz) IBW/kg (Calculated) : 63.9 TPN AdjBW (KG): 86.4 Body mass index is 56.58 kg/m.  Assessment:  52 yo female with PMH of Bell's palsy, DM, CHF, hypothyroidism, hx DVT on Eliquis. Recently discharged on 3/21 after admission for abdominal pain and intractable nausea/vomiting. Brought back to ED on 3/24 for AMS and loss of consciousness. Endorsed abdominal pain and nausea/vomiting on day of admission. Pharmacy has been consulted on 4/3 to begin TPN as patient now with evidence of SBO on imaging and prolonged poor PO intake.   Discussed with patient on 4/3, she states that she really has not been able to tolerate a meal since before her previous admission ~3/16. Has a few juice cups and water cup on her tray, but states she is really only able to drink liquids when she is taking meds. Intake causes her to experience nausea and abdominal pain. Patient stated that just thinking about food/liquid/PO intake during our discussion made her feel nauseous.   Bowel function returned on 4/10. Diet advanced from full liquids to soft diet on 4/12. Pt only eating 10-30% of meals - limited by nausea, especially after PO contrast given for CTAP on 4/13.  Glucose / Insulin: hx T2DM, last A1c 7.4% 05/2022, on U-500 100 units TID, metformin, Jardiance, and Ozempic PTA >> was started on Semglee 100 units BID here but reduced down to Semglee 15 units QD,  - CBGs 148-183, required 24 units of SSI/24h while on rSSI, 20u insulin in TPN  Electrolytes: Mag 1.7, K: 3.8, CoCa: 10.1, all others WNL  Renal: Scr <1, BUN 10 Hepatic: LFTs wnl, TG: 75  Intake / Output: UOP 0.24mL/kg/hr, +10L this admission still holding home diuretics, normal volume status, LBM 4/13 (x3 occurrences), 58mL PO intake   GI Imaging: -  3/24 CTAP: inflammatory/infectious process such as PID w/ developing abscess, no bowel obstruction - 3/29 Abd X-ray: multiple dilated air-filled loops of small and large bowel, could represent ileus or developing obstruction - 4/2 CTAP: new loculated rim enhancing fluid collection w/in the RLQ, several loops of distal small bowel, developing mid small bowel obstruction - 4/5 Abd x-ray: persistent dilated loops of small bowel which may reflect ileus or pSBO - 4/6 Abd Xray: no significant change in small bowel dilation - 4/10 Abd Xray: interval improvement in small bowel dilatation - 4/13 CTAP: results pending GI Surgeries / Procedures:  - 3/26: CT-guided left pelvic abscess drain placement  Central access: 4/3 TPN start date: 4/3  Nutritional Goals: Goal TPN rate is 90 mL/hr (provides 112 g of protein and 1988 kcals per day)  RD Assessment: Estimated Needs Total Energy Estimated Needs: 1900-2100 kcal/d Total Protein Estimated Needs: 100-120 g/d Total Fluid Estimated Needs: 2L/d  Current Nutrition:  TPN Dysphagia 3 diet  Plan:  Continue TPN at 73mL/hr at 1800 providing 112g AA and 1988 kcal meeting 100% of nutritional needs.  Electrolytes in TPN: Na 159mEq/L, K 62mEq/L ( total), Ca 77mEq/L, Mg increased to 15 mEq/L, and Phos 44mmol/L. Cl:Ac changed to 1:2.  Add standard MVI and trace elements to TPN Increase Insulin 25 units added to TPN, will trend CBG and SSI requirements with basal insulin per MD.  Continue resistant q4h SSI and adjust as needed Magnesium sulfate 2g IV x1  Potassium 10 meq IV x 3 Monitor  TPN labs on Mon/Thurs F/u ability to tolerate diet and increase PO intake and wean TPN F/u calorie count - initiated 4/15   Jeanella Cara, PharmD, [facility] Clinical Pharmacist Please see AMION for all Pharmacists' Contact Phone Numbers 11/14/2022, 7:31 AM    Please refer to [facility] for pharmacy phone number

## 2022-11-14 NOTE — Progress Notes (Signed)
Nutrition Follow-up  DOCUMENTATION CODES:  Morbid obesity  INTERVENTION:  Continue current diet as tolerated TPN management per pharmacy Would not recommend reducing infusion rate of TPN until pt can consistently tolerate diet Boost Breeze po BID, each supplement provides 250 kcal and 9 grams of protein Prosource Plus BID to provide 100 kcal and 15g of protein per packet  NUTRITION DIAGNOSIS:  Inadequate oral intake related to poor appetite, nausea as evidenced by meal completion < 25%, NPO status, per patient/family report. - remains applicable  GOAL:  Patient will meet greater than or equal to 90% of their needs - progressing, being met with TPN  MONITOR:  I & O's, Labs, Weight trends, Diet advancement  REASON FOR ASSESSMENT:  Malnutrition Screening Tool    ASSESSMENT:  Pt with hx of Bell's Palsy, DM type 2, CHF, HTN, HLD, and GERD presented to ED with AMS. Recently admitted for nausea  and abdominal pain. Imaging showed a 5 x 5 cm fluid collection worrisome for a developing abscess.  3/26 - CT guided drain placed in IR 4/3 - PICC placed, TPN initiated 4/5, 4/6, 4/10 - abdominal XR shows persistent SBO/ileus 4/13 - CT abdomen/pelvis: improving small bowel obstruction or ileus  Pt resting in bed eating breakfast at the time of assessment. Reports she is feeling much better today than yesterday. States that she felt very sick after having to drink the contrast for CT scan yesterday. States she is just trying to eat slowly and make sure she can tolerate. Discussed supplements, doing well on both the prosource and boost breeze. Will increase boost breeze to BID.  Discussed with MD and RPH. Will continue full TPN today and starting kcal count to determine how much nutrition is being taken in orally. Encouraged pt to try and consume 3 small meals and drink her supplements. Pt confident she can achieve this today. Will reassess need for full TPN tomorrow.    Current weight  significantly higher than admission weight. Suspect current weight is inaccurate.  Average Meal Intake: 3/25-4/1: 0-5% intake x 3 recorded meals 4/9-4/13: 28% average intake x 8 recorded meals  Admit Weight: 153.2 kg Current Weight: 168.8 kg  Nutritionally Relevant Medications: Scheduled Meds:  PROSource Plus  30 mL Oral BID BM   feeding supplement  1 Container Oral Q24H   folic acid  1 mg Oral Daily   insulin aspart  0-20 Units Subcutaneous Q4H   insulin glargine-yfgn  15 Units Subcutaneous Daily   pantoprazole (PROTONIX) IV  40 mg Intravenous Q24H   polyethylene glycol  17 g Oral Daily   senna-docusate  1 tablet Oral QHS   Continuous Infusions:  potassium chloride     TPN ADULT (ION) 90 mL/hr at 11/14/22 0511   PRN Meds: bisacodyl, loperamide, ondansetron  Labs Reviewed: CBG ranges from 153-184 mg/dL over the last 24 hours HgbA1c 7.4% (4/11)  NUTRITION - FOCUSED PHYSICAL EXAM: Flowsheet Row Most Recent Value  Orbital Region No depletion  Upper Arm Region No depletion  Thoracic and Lumbar Region No depletion  Buccal Region No depletion  Temple Region No depletion  Clavicle Bone Region No depletion  Clavicle and Acromion Bone Region No depletion  Scapular Bone Region No depletion  Dorsal Hand No depletion  Patellar Region No depletion  Anterior Thigh Region No depletion  Posterior Calf Region No depletion  Edema (RD Assessment) Mild  Hair Reviewed  Eyes Reviewed  Mouth Reviewed  Skin Reviewed  Nails Reviewed   Diet Order:   Diet  Order             DIET DYS 3 Room service appropriate? Yes; Fluid consistency: Thin  Diet effective now                  EDUCATION NEEDS:  Education needs have been addressed  Skin:  Skin Assessment: Reviewed RN Assessment  Last BM:  4/14  Height:  Ht Readings from Last 1 Encounters:  10/25/22 5\' 8"  (1.727 m)   Weight:  Wt Readings from Last 1 Encounters:  11/14/22 (!) 168.8 kg   Ideal Body Weight:  63.6 kg  BMI:   Body mass index is 56.58 kg/m.  Estimated Nutritional Needs:  Kcal:  1900-2100 kcal/d Protein:  100-120 g/d Fluid:  2L/d   Greig Castilla, RD, LDN Clinical Dietitian RD pager # available in AMION  After hours/weekend pager # available in [facility]

## 2022-11-15 DIAGNOSIS — G9340 Encephalopathy, unspecified: Secondary | ICD-10-CM | POA: Diagnosis not present

## 2022-11-15 DIAGNOSIS — G9341 Metabolic encephalopathy: Secondary | ICD-10-CM | POA: Diagnosis not present

## 2022-11-15 DIAGNOSIS — N730 Acute parametritis and pelvic cellulitis: Secondary | ICD-10-CM | POA: Diagnosis not present

## 2022-11-15 DIAGNOSIS — R9431 Abnormal electrocardiogram [ECG] [EKG]: Secondary | ICD-10-CM | POA: Diagnosis not present

## 2022-11-15 LAB — MAGNESIUM: Magnesium: 1.8 mg/dL (ref 1.7–2.4)

## 2022-11-15 LAB — BASIC METABOLIC PANEL
Anion gap: 9 (ref 5–15)
BUN: 12 mg/dL (ref 6–20)
CO2: 22 mmol/L (ref 22–32)
Calcium: 8.5 mg/dL — ABNORMAL LOW (ref 8.9–10.3)
Chloride: 104 mmol/L (ref 98–111)
Creatinine, Ser: 0.82 mg/dL (ref 0.44–1.00)
GFR, Estimated: >60 mL/min (ref >60)
Glucose, Bld: 135 mg/dL — ABNORMAL HIGH (ref 70–99)
Potassium: 4.4 mmol/L (ref 3.5–5.1)
Sodium: 135 mmol/L (ref 135–145)

## 2022-11-15 LAB — GLUCOSE, CAPILLARY
Glucose-Capillary: 107 mg/dL — ABNORMAL HIGH (ref 70–99)
Glucose-Capillary: 141 mg/dL — ABNORMAL HIGH (ref 70–99)
Glucose-Capillary: 141 mg/dL — ABNORMAL HIGH (ref 70–99)
Glucose-Capillary: 154 mg/dL — ABNORMAL HIGH (ref 70–99)
Glucose-Capillary: 155 mg/dL — ABNORMAL HIGH (ref 70–99)

## 2022-11-15 LAB — HEMOGLOBIN AND HEMATOCRIT, BLOOD
HCT: 27.2 % — ABNORMAL LOW (ref 36.0–46.0)
HCT: 31.5 % — ABNORMAL LOW (ref 36.0–46.0)
Hemoglobin: 8.7 g/dL — ABNORMAL LOW (ref 12.0–15.0)
Hemoglobin: 9.9 g/dL — ABNORMAL LOW (ref 12.0–15.0)

## 2022-11-15 LAB — PHOSPHORUS: Phosphorus: 4.1 mg/dL (ref 2.5–4.6)

## 2022-11-15 MED ORDER — TRAVASOL 10 % IV SOLN
INTRAVENOUS | Status: AC
  Filled 2022-11-15: qty 561.6

## 2022-11-15 NOTE — Progress Notes (Signed)
Occupational Therapy Treatment Patient Details Name: KELSY POLACK MRN: [id number] DOB: [birth year] Today's Date: 11/15/2022   History of present illness Pt is a 52 y.o. F who presents 10/23/2022 with acute metabolic encephalopathy and severe sepsis secondary to intra-abdominal abscess. S/p drain placement 3/26. 3/30 x-ray showing SBO vs ileus. Significant PMH:  morbid obesity with BMI greater than 50, systolic/diastolic CHF with previous ejection fraction of 30 to 35% and grade 1 diastolic dysfunction, chronic right ventricular failure, history of DVT, hypothyroidism, depression and anxiety and chronic abdominal pain.   OT comments  Patient supine in bed and reports nausea.  With encouragement agreeable to attempt EOB, completing with increased time and supervision.  Patient transferred to recliner with min guard using RW.  Continues to requires increased assist for LB ADLs, but limited session today due to nausea.  Increased time for all tasks.  Continue to recommend post acute rehab.  Updated goals today, met 3/5 goals. Will follow.    Recommendations for follow up therapy are one component of a multi-disciplinary discharge planning process, led by the attending physician.  Recommendations may be updated based on patient status, additional functional criteria and insurance authorization.    Assistance Recommended at Discharge Frequent or constant Supervision/Assistance  Patient can return home with the following  A lot of help with bathing/dressing/bathroom;Assistance with cooking/housework;Assist for transportation;Help with stairs or ramp for entrance;Direct supervision/assist for financial management;Direct supervision/assist for medications management;A little help with walking and/or transfers   Equipment Recommendations  Other (comment) (defer)    Recommendations for Other Services      Precautions / Restrictions Precautions Precautions: Fall;Other (comment) Precaution Comments:  bilateral drains Restrictions Weight Bearing Restrictions: No       Mobility Bed Mobility Overal bed mobility: Needs Assistance Bed Mobility: Supine to Sit     Supine to sit: HOB elevated, Supervision     General bed mobility comments: increased time needed    Transfers Overall transfer level: Needs assistance Equipment used: Rolling walker (2 wheels) Transfers: Sit to/from Stand Sit to Stand: Min guard           General transfer comment: increased time but no assist to power up     Balance Overall balance assessment: Needs assistance Sitting-balance support: Feet supported, Single extremity supported Sitting balance-Leahy Scale: Fair Sitting balance - Comments: Prefers UE support   Standing balance support: During functional activity, Bilateral upper extremity supported Standing balance-Leahy Scale: Poor Standing balance comment: reliant on Bil UE support                           ADL either performed or assessed with clinical judgement   ADL Overall ADL's : Needs assistance/impaired                     Lower Body Dressing: Maximal assistance;Sit to/from stand Lower Body Dressing Details (indicate cue type and reason): sit to stand with min guard, relies on BUE support and needs assist for socks Toilet Transfer: Rolling walker (2 wheels);Ambulation;Min guard Statistician Details (indicate cue type and reason): simulated to recliner         Functional mobility during ADLs: Min guard;Rolling walker (2 wheels) General ADL Comments: pt nauseated upon entry, requires encouragement for OOB and then voices "I think i should get to the chair and try to eat"    Extremity/Trunk Assessment              Vision  Perception     Praxis      Cognition Arousal/Alertness: Awake/alert Behavior During Therapy: WFL for tasks assessed/performed Overall Cognitive Status: Within Functional Limits for tasks assessed                                           Exercises      Shoulder Instructions       General Comments VSS on RA    Pertinent Vitals/ Pain       Pain Assessment Pain Assessment: Faces Faces Pain Scale: Hurts a little bit Pain Location: drain location, abdomen Pain Descriptors / Indicators: Tightness, Guarding, Discomfort, Grimacing, Sore Pain Intervention(s): Limited activity within patient's tolerance, Monitored during session, Repositioned  Home Living                                          Prior Functioning/Environment              Frequency  Min 2X/week        Progress Toward Goals  OT Goals(current goals can now be found in the care plan section)  Progress towards OT goals: Progressing toward goals  Acute Rehab OT Goals Patient Stated Goal: get better OT Goal Formulation: With patient Time For Goal Achievement: 11/29/22 Potential to Achieve Goals: Fair  Plan Discharge plan remains appropriate;Frequency remains appropriate    Co-evaluation                 AM-PAC OT "6 Clicks" Daily Activity     Outcome Measure   Help from another person eating meals?: None Help from another person taking care of personal grooming?: A Little Help from another person toileting, which includes using toliet, bedpan, or urinal?: A Little Help from another person bathing (including washing, rinsing, drying)?: A Lot Help from another person to put on and taking off regular upper body clothing?: A Little Help from another person to put on and taking off regular lower body clothing?: A Lot 6 Click Score: 17    End of Session Equipment Utilized During Treatment: Rolling walker (2 wheels)  OT Visit Diagnosis: Other abnormalities of gait and mobility (R26.89);Pain;Muscle weakness (generalized) (M62.81)   Activity Tolerance Patient tolerated treatment well   Patient Left in chair;with call bell/phone within reach   Nurse Communication Mobility status         Time: 4431-5400 OT Time Calculation (min): 27 min  Charges: OT General Charges $OT Visit: 1 Visit OT Treatments $Self Care/Home Management : 8-22 mins $Therapeutic Activity: 8-22 mins  Barry Brunner, OT Acute Rehabilitation Services Office [phone]   Chancy Milroy 11/15/2022, 1:18 PM

## 2022-11-15 NOTE — Progress Notes (Signed)
Calorie Count Note  48-hour calorie count ordered.  Pt resting in bed at the time of assessment waiting on her breakfast to arrive. Pt reports that she did eat some at all 3 meals yesterday and drank 1 boost breeze. Not meeting nutrition needs yet, but pt reports that she is feeling better this AM than yesterday and is planning to eat a little more today. Pt states that she feels that she can consume the boost breeze and prosource today as well as her meals. Pt would be appropriate to have TPN reduced tonight.  Pt reports that she did vomit last night but that it did not appear to be food. States it was mostly bile. Discussed additional foods that pt could try today that would be calorically dense. Also advised pt that if a food sounded good that was not on her diet to alert her RN as she is on a DYS3 at her own request, not due to a swallowing deficit. Pt expresses understanding.  Diet: DYS 3, thin liquids Supplements: Boost Breeze BID, Prosource Plus BID  Estimated Nutritional Needs:  Kcal:  1900-2100 kcal/d Protein:  100-120 g/d Fluid:  2L/d  4/15 Breakfast: 195 kcal, 7g of protein 4/15 Lunch: 128 kcal, 3g protein 4/15 Dinner: 198kcal, 4g protein  Supplements: Boost Breeze 250kcal, 9g of protein  Total intake: 771 kcal (41% of minimum estimated needs)  23 protein (23% of minimum estimated needs)  NUTRITION DIAGNOSIS:  Inadequate oral intake related to poor appetite, nausea as evidenced by meal completion < 25%, NPO status, per patient/family report. - remains applicable   GOAL:  Patient will meet greater than or equal to 90% of their needs - progressing, being met with TPN  INTERVENTION:  Continue current diet as tolerated Changing to ordering assistance TPN management per pharmacy Adjust rate based on pt's improving intake and tolerance Boost Breeze po BID, each supplement provides 250 kcal and 9 grams of protein Prosource Plus BID to provide 100 kcal and 15g of protein per  packet  Greig Castilla, RD, LDN Clinical Dietitian RD pager # available in [facility]  After hours/weekend pager # available in [facility]

## 2022-11-15 NOTE — Progress Notes (Signed)
Referring Physician(s): Dr. Marland Mcalpine  Supervising Physician: Mir, Mauri Reading  Patient Status:  [facility] - In-pt  Chief Complaint:  Left TOA s/p drain placement by Dr. Deanne Coffer on 3/26  RLQ intraabdominal fluid collection s/p drain placement by Dr. Fredia Sorrow on 11/03/22   Subjective: Patient sitting up in the chair eating lunch and watching TV. She denies any significant pain or discomfort. Discussed drain removal with the patient.   Allergies: Metoclopramide, Codeine, Hydrocodone, and Percocet [oxycodone-acetaminophen]  Medications: Prior to Admission medications   Medication Sig Start Date End Date Taking? Authorizing Provider  ADVAIR HFA 230-21 MCG/ACT inhaler Inhale 2 puffs into the lungs 2 (two) times daily. 12/20/20   [provider]  albuterol (PROVENTIL) (2.5 MG/3ML) 0.083% nebulizer solution Take 2.5 mg by nebulization as needed for wheezing.    [provider]  ALPRAZolam Prudy Feeler) 0.5 MG tablet Take 0.5 mg by mouth as needed for anxiety or sleep. 04/19/17   [provider]  apixaban (ELIQUIS) 2.5 MG TABS tablet Take 1 tablet (2.5 mg total) by mouth 2 (two) times daily. 09/21/22   Yates Decamp, MD  ARIPiprazole (ABILIFY) 5 MG tablet  01/14/21   [provider]  Azathioprine 75 MG TABS Take 150 mg by mouth 2 (two) times daily. 02/04/21   [provider]  B-D UF III MINI PEN NEEDLES 31G X 5 MM MISC USE AS DIRECTED TWICE A DAY 10/31/18   Romero Belling, MD  Belimumab (BENLYSTA) 200 MG/ML SOAJ Inject 200 mg into the skin once a week. Thursdays 12/19/19   [provider]  buPROPion (WELLBUTRIN XL) 300 MG 24 hr tablet Take 300 mg by mouth daily. 03/14/22   [provider]  carvedilol (COREG) 25 MG tablet Take 1.5 tablets (37.5 mg total) by mouth 2 (two) times daily with a meal. 01/01/21   Yates Decamp, MD  Continuous Blood Gluc Sensor (FREESTYLE LIBRE 3 SENSOR) MISC 1 Device by Does not apply route every 14 (fourteen) days. Apply 1 sensor on upper  arm every 14 days for continuous glucose monitoring 05/25/22   Reather Littler, MD  cromolyn (OPTICROM) 4 % ophthalmic solution Place 1 drop into both eyes 2 (two) times daily. 01/14/21   [provider]  cycloSPORINE (RESTASIS) 0.05 % ophthalmic emulsion Place 1 drop into both eyes in the morning, at noon, and at bedtime.     [provider]  diclofenac Sodium (VOLTAREN) 1 % GEL APPLY 2 GRAMS TO AFFECTED AREA 4 TIMES A DAY Patient taking differently: Apply 2 g topically 4 (four) times daily as needed (pain). 01/11/22   Cantwell, Celeste C, PA-C  EASY TOUCH PEN NEEDLES 32G X 4 MM MISC USE TO INJECT INSULIN TWICE DAILY 08/07/19   Romero Belling, MD  famotidine (PEPCID) 20 MG tablet Take 20 mg by mouth 2 (two) times daily.    [provider]  fluticasone (FLONASE) 50 MCG/ACT nasal spray Place 2 sprays into both nostrils continuous as needed for allergies or rhinitis.    [provider]  glucose blood (ACCU-CHEK GUIDE) test strip 1 each by Other route 2 (two) times daily. And lancets 2/day 06/18/21   Romero Belling, MD  glucose blood [facility] ULTRA) test strip Test blood sugar 4 times a day 03/30/22   Reather Littler, MD  hydroxychloroquine (PLAQUENIL) 200 MG tablet Take 200 mg by mouth 2 (two) times daily.    [provider]  ibuprofen (ADVIL) 600 MG tablet Take 1 tablet (600 mg total) by mouth every 6 (six)  hours as needed. Patient taking differently: Take 600 mg by mouth every 6 (six) hours as needed for mild pain. 10/14/22   Roxy Horseman, PA-C  insulin regular human CONCENTRATED (HUMULIN R U-500 KWIKPEN) 500 UNIT/ML KwikPen 100 UNITS, 30 MINUTES BEFORE EACH MEAL Patient taking differently: 100 Units 3 (three) times daily with meals. 100 Units, 30 minutes before each meal 08/16/22   Reather Littler, MD  Insulin Syringe-Needle U-100 (EASY TOUCH INSULIN SYRINGE) 31G X 5/16" 1 ML MISC USE AS DIRECTED THREE TIMES A DAY 10/12/18   Romero Belling, MD  JARDIANCE 10 MG TABS tablet  TAKE 1 TABLET (10 MG TOTAL) BY MOUTH DAILY WITH BREAKFAST. 08/18/22   Reather Littler, MD  Lancets [facility] ULTRASOFT) lancets Test blood sugar 4 times a day 03/30/22   Reather Littler, MD  levothyroxine (SYNTHROID) 50 MCG tablet Take 1 tablet (50 mcg total) by mouth daily at 6 (six) AM. 08/30/19   Marguerita Merles Latif, DO  megestrol (MEGACE) 40 MG tablet Take 40 mg by mouth daily. 01/30/15   [provider]  metFORMIN (GLUCOPHAGE-XR) 500 MG 24 hr tablet Take 2 tablets (1,000 mg total) by mouth 2 (two) times daily. 02/02/21   Romero Belling, MD  Multiple Vitamin (MULTIVITAMIN WITH MINERALS) TABS tablet Take 1 tablet by mouth daily. 08/30/19   Marguerita Merles Latif, DO  ondansetron (ZOFRAN-ODT) 4 MG disintegrating tablet Take 1 tablet (4 mg total) by mouth every 8 (eight) hours as needed for nausea or vomiting. 10/14/22   Roxy Horseman, PA-C  oxybutynin (DITROPAN-XL) 5 MG 24 hr tablet Take 5 mg by mouth daily. 04/25/22   [provider]  pregabalin (LYRICA) 75 MG capsule Take 75 mg 3 (three) times daily by mouth.  01/28/15   [provider]  prochlorperazine (COMPAZINE) 10 MG tablet Take 1 tablet (10 mg total) by mouth every 6 (six) hours as needed for nausea or vomiting. 05/14/22   Osvaldo Shipper, MD  promethazine (PHENERGAN) 25 MG suppository Place 1 suppository (25 mg total) rectally every 6 (six) hours as needed for nausea or vomiting. 10/21/22   Chestine Spore, Meghan R, PA-C  QUEtiapine (SEROQUEL) 300 MG tablet Take 300 mg by mouth at bedtime. 01/08/21   [provider]  rosuvastatin (CRESTOR) 20 MG tablet Take 1 tablet (20 mg total) by mouth daily. 06/09/21 10/16/22  Cantwell, Celeste C, PA-C  Semaglutide, 2 MG/DOSE, 8 MG/3ML SOPN Inject 2 mg as directed once a week. 08/18/22   Reather Littler, MD  sucralfate (CARAFATE) 1 GM/10ML suspension Take 10 mLs by mouth 2 (two) times daily as needed (abdomen pain). 12/09/20   [provider]  topiramate (TOPAMAX) 25 MG tablet Take 75 mg by mouth  at bedtime. 03/17/16   [provider]  torsemide (DEMADEX) 20 MG tablet Take 1 tablet (20 mg total) by mouth at bedtime. 06/09/21   Cantwell, Celeste C, PA-C  TRANSDERM-SCOP 1 MG/3DAYS Place 1 patch (1.5 mg total) onto the skin every 3 (three) days. As needed for nausea 10/21/22   Melton Alar R, PA-C  Vitamin D, Ergocalciferol, (DRISDOL) 1.25 MG (50000 UNIT) CAPS capsule Take 50,000 Units by mouth once a week. 09/20/22   [provider]     Vital Signs: BP 136/81 (BP Location: Left Arm)   Pulse 98   Temp 98.6 F (37 C) (Oral)   Resp 18   Ht  (1.727 m)   Wt (!) 372 lb 2.2 oz (168.8 kg)   LMP 03/24/2017   SpO2 99%  BMI 56.58 kg/m   Physical Exam Constitutional:      General: She is not in acute distress.    Appearance: She is obese. She is not ill-appearing.  Pulmonary:     Effort: Pulmonary effort is normal.  Abdominal:     Comments: Right and left lower quadrant drains. Both sites clean/dry/intact. Both sites with sutures and stat lock intact. Scant material in each bulb/gravity bag. Skin insertion sites are unremarkable.   Skin:    General: Skin is warm and dry.  Neurological:     Mental Status: She is alert and oriented to person, place, and time.     Imaging: CT ABDOMEN PELVIS W CONTRAST  Result Date: 11/13/2022 CLINICAL DATA:  Follow-up intra-abdominal abscesses status post percutaneous catheter drainage. EXAM: CT ABDOMEN AND PELVIS WITH CONTRAST TECHNIQUE: Multidetector CT imaging of the abdomen and pelvis was performed using the standard protocol following bolus administration of intravenous contrast. RADIATION DOSE REDUCTION: This exam was performed according to the departmental dose-optimization program which includes automated exposure control, adjustment of the mA and/or kV according to patient size and/or use of iterative reconstruction technique. CONTRAST:  75mL OMNIPAQUE IOHEXOL 350 MG/ML SOLN COMPARISON:  11/01/2022 FINDINGS: Lower Chest: No  acute findings. Resolution of small right pleural effusion since previous study. Decreased right basilar atelectasis. Mild left lower lobe atelectasis shows no significant change. Hepatobiliary: No hepatic masses identified. Gallbladder is unremarkable. No evidence of biliary ductal dilatation. Pancreas:  No mass or inflammatory changes. Spleen: Within normal limits in size and appearance. Adrenals/Urinary Tract: No suspicious masses identified. 2 mm calculus again seen in upper pole of right kidney. No evidence of ureteral calculi or hydronephrosis. Stomach/Bowel: Mild decrease in small bowel dilatation since prior exam, consistent with improving small bowel obstruction. Vascular/Lymphatic: No pathologically enlarged lymph nodes. No acute vascular findings. Reproductive: Normal size uterus. Right lower quadrant percutaneous drainage catheter is seen in appropriate position, and there has been resolution of the right lower quadrant fluid collections since prior study. Left lower quadrant percutaneous drainage catheter remains in place, without evidence of residual fluid collection. There has been resolution of pelvic free fluid since prior study as well. No new abnormal fluid collections or ascites identified. Other:  None. Musculoskeletal:  No suspicious bone lesions identified. IMPRESSION: Resolution of bilateral lower quadrant fluid collections following percutaneous drainage catheter placements. No new or enlarging abscess identified. Mild decrease in small bowel dilatation, consistent with improving small bowel obstruction or ileus. Resolution of small right pleural effusion and right basilar atelectasis. Stable mild left lower lobe atelectasis. Electronically Signed   By: Danae Orleans M.D.   On: 11/13/2022 12:18    Labs:  CBC: Recent Labs    11/10/22 0617 11/11/22 0346 11/12/22 0332 11/14/22 0615  WBC 9.7 8.5 8.9 8.8  HGB 7.3* 7.8* 8.3* 7.9*  HCT 22.6* 23.9* 25.2* 24.8*  PLT 316 283 253 216     COAGS: Recent Labs    10/23/22 2110  INR 1.2  APTT 26    BMP: Recent Labs    11/11/22 1039 11/12/22 0332 11/13/22 0534 11/14/22 0615  NA 136 134* 136 139  K 3.9 4.2 4.0 3.8  CL 105 107 103 105  CO2 GLUCOSE 183* 178* 166* 154*  BUN CALCIUM 8.4* 8.0* 8.3* 8.3*  CREATININE 0.72 0.65 0.64 0.70  GFRNONAA >60 >60 >60 >60    LIVER FUNCTION TESTS: Recent Labs    11/08/22 0800 11/09/22 0557 11/10/22  0617 11/14/22 0615  BILITOT 0.5 0.5 0.5 0.3  AST 19 19 16  14*  ALT 14 16 15 14   ALKPHOS 93 87 79 76  PROT 5.5* 5.6* 5.4* 5.5*  ALBUMIN 1.9* 1.9* 1.8* 2.0*    Assessment and Plan:  Left TOA s/p drain placement by Dr. Deanne Coffer on 3/26  RLQ intraabdominal fluid collection s/p drain placement by Dr. Fredia Sorrow on 11/03/22   Drain Location: RLQ to suction, LLQ to gravity Size: 10 Fr. And 12 Fr.  Date of placement: 3/26 and 4/4 Currently to: suction; gravity  24 hour output:  Output by Drain (mL) 11/13/22 0700 - 11/13/22 1459 11/13/22 1500 - 11/13/22 2259 11/13/22 2300 - 11/14/22 0659 11/14/22 0700 - 11/14/22 1459 11/14/22 1500 - 11/14/22 2259 11/14/22 2300 - 11/15/22 0659 11/15/22 0700 - 11/15/22 1459 11/15/22 1500 - 11/15/22 1600  Closed System Drain 1 Left LLQ Other (Comment) 12 Fr. 2 5        Closed System Drain 2 RUQ Bulb (JP) 10 Fr. 2 10 2          Interval imaging/drain manipulation:  CT abdomen/pelvis with contrast 11/12/22 IMPRESSION: Resolution of bilateral lower quadrant fluid collections following percutaneous drainage catheter placements. No new or enlarging abscess identified.   Mild decrease in small bowel dilatation, consistent with improving small bowel obstruction or ileus.   Resolution of small right pleural effusion and right basilar atelectasis. Stable mild left lower lobe atelectasis.  Current examination:  Dressings are clean dry and intact. Sutures and stat lock intact. Scant amount of material in bulb/gravity bag.    Plan:  Imaging shows resolution of the fluid collections and there has been minimal drain output over the past several days. Patient is afebrile and without leukocytosis. Both drains removed at the bedside. Patient tolerated removals well. Both sites covered with gauze/tape. Order placed for routine dressing changes for the next few days.   Electronically Signed: Alwyn Ren, AGACNP-BC [phone] 11/15/2022, 3:57 PM   I spent a total of 15 Minutes at the the patient's bedside AND on the patient's hospital floor or unit, greater than 50% of which was counseling/coordinating care for abscess drains/removal

## 2022-11-15 NOTE — Progress Notes (Signed)
PROGRESS NOTE  TOBI BONNET HUO:[id number] DOB: 03-Jul-1971   PCP: Verlon Au, MD  Patient is from: Home.  DOA: 10/23/2022 LOS: 22  Chief complaints Chief Complaint  Patient presents with   Code Stroke     Brief Narrative / Interim history: 52 year old F with PMH of morbid obesity, combined CHF, RV failure, DVT/PE on prophylactic dose Eliquis, hypothyroidism, anxiety and depression presenting with lightheadedness, abdominal pain and confusion, and admitted for severe sepsis with acute encephalopathy in the setting of intra-abdominal abscess concerning for PID/TOA.  Patient was started on antibiotics.  Gynecology, general surgery and interventional radiology consulted.  Patient had left pelvic drain placed on 3/26.  Fluid culture with Streptococcus anginosus.  Mentation improved.  She developed nausea and vomiting.  Concerned about ileus/SBO on KUB on 3/30.  NG tube placed for SBO protocol but patient was not able to tolerate.  PICC line placed and started on TPN on 4/2.  Repeat CT abdomen and pelvis on 4/2 with complete evacuation of left adnexal fluid collection but persistent poorly loculated fluid within the right adnexa and new loculated rim-enhancing fluid collection within RLQ measuring 3.2 x 7.4 x 5.0 cm, and developing mid SBO with single-point transition.  Patient had RLQ drain placement on 4/4.  Gyn recommended TVUS and doxycycline to treat for possible PID.  TVUS showed small amount of free fluid in pelvis and the ovaries were not visualized.  Completed antibiotic course per ID and GYN recommendation.  CT on 4/13 with resolution of bilateral lower quadrant fluid collection, mild decrease in small bowel dilation and resolution of small right pleural effusion and right basilar atelectasis.  IR aware of CT results  Advanced diet to soft.  P.o. intake improving.  TPN decreased to half on 4/16.  Therapy recommends SNF   Subjective: Seen and examined earlier this morning.  No  major events overnight of this morning.  Reports an episode of emesis yesterday.  Feels better today.  Waiting on breakfast.  She is trying to avoid tube feed.  Continues to endorse significant abdominal pain intermittently.  Again, advised on the importance of cutting down on IV pain medication use.   Objective: Vitals:   11/15/22 0347 11/15/22 0500 11/15/22 0807 11/15/22 1303  BP: 138/76  132/72 (!) 153/85  Pulse: 90  92 98  Resp: 19  17 20   Temp: 97.9 F (36.6 C)  98.6 F (37 C) 98.4 F (36.9 C)  TempSrc: Oral  Oral Oral  SpO2: 100%  100% 100%  Weight:  (!) 168.8 kg    Height:        Examination:  GENERAL: No apparent distress.  Nontoxic. HEENT: MMM.  Vision and hearing grossly intact.  NECK: Supple.  No apparent JVD.  RESP:  No IWOB.  Fair aeration bilaterally. CVS:  RRR. Heart sounds normal.  ABD/GI/GU: BS+. Abd soft. Mild TTP across lower abdomen.  Small purulent fluid from LLQ drain.  Right JP empty MSK/EXT:   No apparent deformity. Moves extremities. No edema.  SKIN: no apparent skin lesion or wound NEURO: Awake and alert. Oriented appropriately.  No apparent focal neuro deficit. PSYCH: Calm. Normal affect.   Procedures:  3/26-CT-guided left pelvic drain 4/4-CT-guided RLQ drain  Microbiology summarized: 3/24-wet prep negative 3/26-drain culture with Streptococcus anginosus 3/26-blood cultures NGTD 3/31-urine culture with insignificant growth 4/4-drain culture negative.  Assessment and plan: Principal Problem:   Septic encephalopathy Active Problems:   Hypertension complicating diabetes   Chronic systolic CHF (congestive heart failure)  SJOGREN'S SYNDROME   QT prolongation   Type 2 diabetes mellitus with hyperlipidemia   History of CVA (cerebrovascular accident)   Mood disorder   SLE (systemic lupus erythematosus related syndrome)   PID (acute pelvic inflammatory disease)   Intra-abdominal abscess   Severe sepsis  Severe sepsis secondary to  intra-abdominal abscess concerning for PID/TOA: Met criteria for severe sepsis with fever, leukocytosis, AKI and encephalopathy. -S/p LLQ drain placement on 3/26 and RLQ drain placement on 4/4 -Received different antibiotics from 3/25 to 4/14 per recommendation by ID and GYN. -Repeat CT abdomen and pelvis on 4/13 with resolution of intra-abdominal fluid collection improved ileus. -Drain output minimal.  IR notified about CT finding on 4/15. -OOB/PT/OT  Nausea/vomiting/abdominal pain: After contrast for CT abdomen and pelvis.  Improved. Persistent ileus: CT with improved ileus.  Having bowel movements.  Decreased oral intake: Improving. -Encouraged her to cut down on IV narcotic use. -Plan to decrease TPN to half today (4/16). -Continue dysphagia-3 diet -PT/OT for mobility  Acute metabolic encephalopathy: Likely due to the above.  Other encephalopathy workup including ammonia, TSH, HIV, RPR, EEG, CT head and VBG without significant finding.  Encephalopathy resolved. -Reorientation and delirium precautions -Minimize or avoid sedating medications-decreasing Dilaudid  Chronic combined CHF with recovered EF: TTE on 3/25 with LVEF of 55 to 60% (30 to 35% in 2022), indeterminate DD and normal RV.  Difficult to assess fluid status due to body habitus.  No significant cardiopulmonary symptoms.  On torsemide 20 mg daily and Jardiance 10 mg daily at home. -Continue monitoring fluid and respiratory status -Continue holding home torsemide and Jardiance.  Normocytic anemia: Hgb drifting down after initial improvement.  No overt bleeding.  Anemia panel with mild folic acid deficiency.  Transfused 1 unit on 4/11 with appropriate response. Recent Labs    11/04/22 0755 11/05/22 0405 11/06/22 1123 11/07/22 0455 11/08/22 0800 11/09/22 0557 11/10/22 0617 11/11/22 0346 11/12/22 0332 11/14/22 0615  HGB 7.2* 7.4* 7.6* 8.9* 7.5* 7.4* 7.3* 7.8* 8.3* 7.9*  -Monitor H&H intermittently -Continue folic  acid  Uncontrolled IDDM-2 with hyperglycemia: A1c 7.4% Recent Labs  Lab 11/14/22 1231 11/14/22 1651 11/14/22 2035 11/15/22 0810 11/15/22 1249  GLUCAP 181* 156* 131* 141* 155*  -Continue Semglee 15 units daily -Pharmacy adjusting insulin in TPN. -Continue SSI-resistant scale -Further adjustment as appropriate.  History of PE/DVT: On low-dose Eliquis at home. -Continue prophylactic dose Lovenox.  Lupus and Sjogren's Syndrome: On Plaquenil, Benlysta and Humira. -Holding home meds in the setting of active infection  Syncope?  Due to #1.  TTE reassuring. -Treat infection as above  UTI ruled out.   AKI: Resolved.   Gastroparesis?  Intolerant to Reglan. -Continue Zofran.   Hypokalemia/hypomagnesemia -Monitor replenish as appropriate   Mood disorder: Somewhat anxious.  Pharmacy not able to verify home meds. -On Seroquel 200 mg at night.  Seems to be on 300 mg at home -Xanax and Wellbutrin discontinued early in the course   Hypothyroidism -Continue home Synthroid   Overactive bladder: Seems to be stable off home oxybutynin.   GERD -Continue PPI   Diabetic Neuropathy: Lyrica discontinued due to encephalopathy.  Stable.   Hyperlipidemia. -Holding Crestor until off of TPN    Oral candidiasis. -Completed a week of Fluconazole.   Morbid obesity with significant comorbidity as above Body mass index is 56.58 kg/m.  Inadequate oral intake.  Severe malnutrition ruled out Nutrition Problem: Inadequate oral intake Etiology: poor appetite, nausea Signs/Symptoms: meal completion < 25%, NPO status, per patient/family report Interventions:  Refer to RD note for recommendations   DVT prophylaxis:  Place and maintain sequential compression device Start: 10/25/22 1103 On Lovenox.  Code Status: Full code Family Communication: None at bedside Level of care: Telemetry Medical Status is: Inpatient Remains inpatient appropriate because: Intra-abdominal infection, poor p.o.  intake and TPN dependence   Final disposition: SNF. Consultants:  General surgery Interventional radiology Gynecology Infectious disease  55 minutes with more than 50% spent in reviewing records, counseling patient/family and coordinating care.   Sch Meds:  Scheduled Meds:  (feeding supplement) PROSource Plus  30 mL Oral BID BM   Chlorhexidine Gluconate Cloth  6 each Topical Daily   enoxaparin (LOVENOX) injection  80 mg Subcutaneous Q24H   feeding supplement  1 Container Oral BID BM   folic acid  1 mg Oral Daily   insulin aspart  0-20 Units Subcutaneous Q4H   insulin glargine-yfgn  15 Units Subcutaneous Daily   levothyroxine  50 mcg Oral Q0600   pantoprazole (PROTONIX) IV  40 mg Intravenous Q24H   polyethylene glycol  17 g Oral Daily   QUEtiapine  200 mg Oral QHS   senna-docusate  1 tablet Oral QHS   sodium chloride flush  3 mL Intravenous Q12H   sodium chloride flush  5 mL Intracatheter Q8H   sodium chloride flush  5 mL Intracatheter Q8H   Continuous Infusions:  TPN ADULT (ION) 90 mL/hr at 11/15/22 0631   TPN ADULT (ION)     PRN Meds:.acetaminophen **OR** acetaminophen, bisacodyl, HYDROmorphone (DILAUDID) injection, hydrOXYzine, loperamide, ondansetron (ZOFRAN) IV, sodium chloride flush  Antimicrobials: Anti-infectives (From admission, onward)    Start     Dose/Rate Route Frequency Ordered Stop   11/11/22 2200  doxycycline (VIBRA-TABS) tablet 100 mg  Status:  Discontinued        100 mg Oral Every 12 hours 11/11/22 1212 11/13/22 1327   11/11/22 2000  amoxicillin-clavulanate (AUGMENTIN) 875-125 MG per tablet 1 tablet  Status:  Discontinued        1 tablet Oral Every 12 hours 11/11/22 1212 11/13/22 1327   11/07/22 1045  doxycycline (VIBRAMYCIN) 100 mg in sodium chloride 0.9 % 250 mL IVPB  Status:  Discontinued        100 mg 125 mL/hr over 120 Minutes Intravenous Every 12 hours 11/07/22 0957 11/11/22 1212   10/30/22 2000  Ampicillin-Sulbactam (UNASYN) 3 g in sodium  chloride 0.9 % 100 mL IVPB  Status:  Discontinued        3 g 200 mL/hr over 30 Minutes Intravenous Every 6 hours 10/30/22 1530 11/11/22 1212   10/26/22 2200  meropenem (MERREM) 2 g in sodium chloride 0.9 % 100 mL IVPB  Status:  Discontinued        2 g 280 mL/hr over 30 Minutes Intravenous Every 8 hours 10/26/22 1409 10/30/22 1530   10/26/22 1000  fluconazole (DIFLUCAN) tablet 100 mg       See Hyperspace for full Linked Orders Report.   100 mg Oral Daily 10/25/22 1412 10/31/22 1011   10/25/22 2000  doxycycline (VIBRAMYCIN) 100 mg in sodium chloride 0.9 % 250 mL IVPB  Status:  Discontinued        100 mg 125 mL/hr over 120 Minutes Intravenous Every 12 hours 10/25/22 1304 10/30/22 1530   10/25/22 1500  fluconazole (DIFLUCAN) tablet 200 mg       See Hyperspace for full Linked Orders Report.   200 mg Oral Daily 10/25/22 1412 10/25/22 1718   10/25/22 1400  meropenem (MERREM) 2  g in sodium chloride 0.9 % 100 mL IVPB  Status:  Discontinued        2 g 280 mL/hr over 30 Minutes Intravenous Every 12 hours 10/25/22 1304 10/26/22 1409   10/25/22 1200  doxycycline (VIBRA-TABS) tablet 100 mg  Status:  Discontinued        100 mg Oral Every 12 hours 10/25/22 1103 10/25/22 1109   10/24/22 0045  cefTRIAXone (ROCEPHIN) 2 g in sodium chloride 0.9 % 100 mL IVPB  Status:  Discontinued        2 g 200 mL/hr over 30 Minutes Intravenous Daily at bedtime 10/24/22 0025 10/25/22 1102   10/24/22 0045  metroNIDAZOLE (FLAGYL) IVPB 500 mg  Status:  Discontinued        500 mg 100 mL/hr over 60 Minutes Intravenous 2 times daily 10/24/22 0025 10/25/22 1102   10/24/22 0030  doxycycline (VIBRA-TABS) tablet 100 mg  Status:  Discontinued        100 mg Oral Every 12 hours 10/24/22 0025 10/25/22 1102   10/24/22 0000  piperacillin-tazobactam (ZOSYN) IVPB 3.375 g  Status:  Discontinued        3.375 g 100 mL/hr over 30 Minutes Intravenous  Once 10/23/22 2348 10/24/22 0025        I have personally reviewed the following labs  and images: CBC: Recent Labs  Lab 11/09/22 0557 11/10/22 0617 11/11/22 0346 11/12/22 0332 11/14/22 0615  WBC 11.1* 9.7 8.5 8.9 8.8  NEUTROABS 6.3  --   --   --   --   HGB 7.4* 7.3* 7.8* 8.3* 7.9*  HCT 22.3* 22.6* 23.9* 25.2* 24.8*  MCV 87.8 89.3 92.6 88.4 89.5  PLT 363 316 283 253 216   BMP &GFR Recent Labs  Lab 11/09/22 0557 11/10/22 0617 11/11/22 1039 11/12/22 0332 11/13/22 0534 11/14/22 0615  NA 134* 135 136 134* 136 139  K 4.1 4.0 3.9 4.2 4.0 3.8  CL 103 102 105 107 103 105  CO2 GLUCOSE 175* 187* 183* 178* 166* 154*  BUN CREATININE 0.71 0.68 0.72 0.65 0.64 0.70  CALCIUM 8.3* 8.3* 8.4* 8.0* 8.3* 8.3*  MG 1.7 1.7 1.8 1.8 1.6* 1.7  PHOS 4.4 4.1  --   --  4.5 4.3   Estimated Creatinine Clearance: 137.5 mL/min (by C-G formula based on SCr of 0.7 mg/dL). Liver & Pancreas: Recent Labs  Lab 11/09/22 0557 11/10/22 0617 11/14/22 0615  AST 19 16 14*  ALT ALKPHOS 87 79 76  BILITOT 0.5 0.5 0.3  PROT 5.6* 5.4* 5.5*  ALBUMIN 1.9* 1.8* 2.0*   No results for input(s): "LIPASE", "AMYLASE" in the last 168 hours. No results for input(s): "AMMONIA" in the last 168 hours. Diabetic: No results for input(s): "HGBA1C" in the last 72 hours.  Recent Labs  Lab 11/14/22 1231 11/14/22 1651 11/14/22 2035 11/15/22 0810 11/15/22 1249  GLUCAP 181* 156* 131* 141* 155*   Cardiac Enzymes: No results for input(s): "CKTOTAL", "CKMB", "CKMBINDEX", "TROPONINI" in the last 168 hours. No results for input(s): "PROBNP" in the last 8760 hours. Coagulation Profile: No results for input(s): "INR", "PROTIME" in the last 168 hours. Thyroid Function Tests: No results for input(s): "TSH", "T4TOTAL", "FREET4", "T3FREE", "THYROIDAB" in the last 72 hours. Lipid Profile: Recent Labs    11/14/22 0615  TRIG 75   Anemia Panel: No results for input(s): "VITAMINB12", "FOLATE", "FERRITIN", "TIBC", "IRON", "RETICCTPCT" in the last 72 hours.  Urine  analysis:    Component Value Date/Time   COLORURINE AMBER (A) 10/30/2022 0300   APPEARANCEUR TURBID (A) 10/30/2022 0300   LABSPEC 1.031 (H) 10/30/2022 0300   PHURINE 5.0 10/30/2022 0300   GLUCOSEU NEGATIVE 10/30/2022 0300   HGBUR NEGATIVE 10/30/2022 0300   BILIRUBINUR NEGATIVE 10/30/2022 0300   KETONESUR 5 (A) 10/30/2022 0300   PROTEINUR 30 (A) 10/30/2022 0300   UROBILINOGEN 0.2 02/27/2015 1733   NITRITE NEGATIVE 10/30/2022 0300   LEUKOCYTESUR NEGATIVE 10/30/2022 0300   Sepsis Labs: Invalid input(s): "PROCALCITONIN", "LACTICIDVEN"  Microbiology: No results found for this or any previous visit (from the past 240 hour(s)).   Radiology Studies: No results found.    Rosemary Mossbarger T. Julyssa Kyer [facility]  If 7PM-7AM, please contact night-coverage [email or web address] 11/15/2022, 1:31 PM

## 2022-11-15 NOTE — Progress Notes (Signed)
Mobility Specialist Progress Note   11/15/22 1500  Mobility  Activity Ambulated with assistance in room  Level of Assistance Contact guard assist, steadying assist  Assistive Device Front wheel walker  Distance Ambulated (ft) 48 ft (24+24)  Activity Response Tolerated well  Mobility Referral Yes  $Mobility charge 1 Mobility   Received pt in chair having no complaints and agreeable. Able to ambulate to door and back to chair x2 w/o fault or symptoms. Returned to chair w/ call bell in reach and needs met.  Frederico Hamman Mobility Specialist Please contact via SecureChat or  Rehab office at [phone]

## 2022-11-15 NOTE — Progress Notes (Signed)
PHARMACY - TOTAL PARENTERAL NUTRITION CONSULT NOTE   Indication: Small bowel obstruction and prolonged inability to tolerate PO intake  Patient Measurements: Height: 5\' 8"  (172.7 cm) Weight: (!) 168.8 kg (372 lb 2.2 oz) IBW/kg (Calculated) : 63.9 TPN AdjBW (KG): 86.4 Body mass index is 56.58 kg/m.  Assessment:  52 yo female with PMH of Bell's palsy, DM, CHF, hypothyroidism, hx DVT on Eliquis. Recently discharged on 3/21 after admission for abdominal pain and intractable nausea/vomiting. Brought back to ED on 3/24 for AMS and loss of consciousness. Endorsed abdominal pain and nausea/vomiting on day of admission. Pharmacy has been consulted on 4/3 to begin TPN as patient now with evidence of SBO on imaging and prolonged poor PO intake.   Discussed with patient on 4/3, she states that she really has not been able to tolerate a meal since before her previous admission ~3/16. Has a few juice cups and water cup on her tray, but states she is really only able to drink liquids when she is taking meds. Intake causes her to experience nausea and abdominal pain. Patient stated that just thinking about food/liquid/PO intake during our discussion made her feel nauseous.   Bowel function returned on 4/10. Diet advanced from full liquids to soft diet on 4/12. Pt only eating 10-30% of meals - limited by nausea, especially after PO contrast given for CTAP on 4/13.  Glucose / Insulin: hx T2DM, last A1c 7.4% 05/2022, on U-500 100 units TID, metformin, Jardiance, and Ozempic PTA >> was started on Semglee 100 units BID here but reduced down to Semglee 15 units QD,  - CBGs 148-183, required 16 units of SSI/24h while on rSSI, 25u insulin in TPN  Electrolytes: Mag 1.7, K: 3.8, CoCa: 10.1, all others WNL  Renal: Scr <1, BUN 10 Hepatic: LFTs wnl, TG: 75  Intake / Output: UOP 0.63mL/kg/hr + 3 urine occurances, LBM 4/13 (x3 occurrences), PO intake   GI Imaging: - 3/24 CTAP: inflammatory/infectious process such  as PID w/ developing abscess, no bowel obstruction - 3/29 Abd X-ray: multiple dilated air-filled loops of small and large bowel, could represent ileus or developing obstruction - 4/2 CTAP: new loculated rim enhancing fluid collection w/in the RLQ, several loops of distal small bowel, developing mid small bowel obstruction - 4/5 Abd x-ray: persistent dilated loops of small bowel which may reflect ileus or pSBO - 4/6 Abd Xray: no significant change in small bowel dilation - 4/10 Abd Xray: interval improvement in small bowel dilatation - 4/13 CTAP: results pending GI Surgeries / Procedures:  - 3/26: CT-guided left pelvic abscess drain placement  Central access: 4/3 TPN start date: 4/3  Nutritional Goals: Goal TPN rate is 90 mL/hr (provides 112 g of protein and 1988 kcals per day)  RD Assessment: Estimated Needs Total Energy Estimated Needs: 1900-2100 kcal/d Total Protein Estimated Needs: 100-120 g/d Total Fluid Estimated Needs: 2L/d  Current Nutrition:  TPN - 1/2 rate 4/16 Dysphagia 3 diet  Plan:  Decrease TPN to 45 mL/hr at 1800 providing 56g AA and 994 kcal meeting 100% of nutritional needs.  Electrolytes in TPN: Na 154mEq/L, K 52mEq/L ( total), Ca 20mEq/L, Mg increased to 15 mEq/L, and Phos 33mmol/L. Cl:Ac changed to 1:2.  Add standard MVI and trace elements to TPN Decrease Insulin 12 units added to TPN given that is being cut in half, will trend CBG and SSI requirements with basal insulin per MD.  Continue resistant q4h SSI and adjust as needed Monitor TPN labs on Mon/Thurs F/u ability  to tolerate diet and increase PO intake and wean TPN F/u calorie count - initiated 4/15  Jeanella Cara, PharmD, [facility] Clinical Pharmacist Please see AMION for all Pharmacists' Contact Phone Numbers 11/15/2022, 10:07 AM    Please refer to [facility] for pharmacy phone number

## 2022-11-16 DIAGNOSIS — G9341 Metabolic encephalopathy: Secondary | ICD-10-CM | POA: Diagnosis not present

## 2022-11-16 DIAGNOSIS — I10 Essential (primary) hypertension: Secondary | ICD-10-CM

## 2022-11-16 DIAGNOSIS — K651 Peritoneal abscess: Secondary | ICD-10-CM | POA: Diagnosis not present

## 2022-11-16 DIAGNOSIS — K567 Ileus, unspecified: Secondary | ICD-10-CM | POA: Diagnosis not present

## 2022-11-16 DIAGNOSIS — I5042 Chronic combined systolic (congestive) and diastolic (congestive) heart failure: Secondary | ICD-10-CM

## 2022-11-16 DIAGNOSIS — Z86718 Personal history of other venous thrombosis and embolism: Secondary | ICD-10-CM

## 2022-11-16 DIAGNOSIS — B370 Candidal stomatitis: Secondary | ICD-10-CM | POA: Insufficient documentation

## 2022-11-16 DIAGNOSIS — A419 Sepsis, unspecified organism: Secondary | ICD-10-CM | POA: Diagnosis not present

## 2022-11-16 DIAGNOSIS — E876 Hypokalemia: Secondary | ICD-10-CM

## 2022-11-16 DIAGNOSIS — E039 Hypothyroidism, unspecified: Secondary | ICD-10-CM | POA: Insufficient documentation

## 2022-11-16 LAB — GLUCOSE, CAPILLARY
Glucose-Capillary: 107 mg/dL — ABNORMAL HIGH (ref 70–99)
Glucose-Capillary: 124 mg/dL — ABNORMAL HIGH (ref 70–99)
Glucose-Capillary: 171 mg/dL — ABNORMAL HIGH (ref 70–99)
Glucose-Capillary: 159 mg/dL — ABNORMAL HIGH (ref 70–99)
Glucose-Capillary: 156 mg/dL — ABNORMAL HIGH (ref 70–99)

## 2022-11-16 MED ORDER — HYDROMORPHONE HCL 2 MG PO TABS
1.0000 mg | ORAL_TABLET | Freq: Four times a day (QID) | ORAL | Status: DC | PRN
  Administered 2022-11-16 – 2022-11-19 (×10): 1 mg via ORAL
  Filled 2022-11-16 (×10): qty 1

## 2022-11-16 MED ORDER — SENNOSIDES-DOCUSATE SODIUM 8.6-50 MG PO TABS
1.0000 | ORAL_TABLET | Freq: Two times a day (BID) | ORAL | Status: DC
  Administered 2022-11-16 – 2022-11-19 (×5): 1 via ORAL
  Filled 2022-11-16 (×6): qty 1

## 2022-11-16 MED ORDER — TRAVASOL 10 % IV SOLN
INTRAVENOUS | Status: AC
  Filled 2022-11-16: qty 561.6

## 2022-11-16 MED ORDER — TORSEMIDE 20 MG PO TABS
20.0000 mg | ORAL_TABLET | Freq: Every day | ORAL | Status: DC
  Administered 2022-11-17 – 2022-11-19 (×3): 20 mg via ORAL
  Filled 2022-11-16 (×3): qty 1

## 2022-11-16 MED ORDER — TORSEMIDE 20 MG PO TABS
40.0000 mg | ORAL_TABLET | Freq: Once | ORAL | Status: AC
  Administered 2022-11-16: 40 mg via ORAL
  Filled 2022-11-16: qty 2

## 2022-11-16 NOTE — Assessment & Plan Note (Addendum)
Presented with leukocytosis, tachycardia.  Developed encephalopathy and renal failure due to sepsis.   Now resolved.

## 2022-11-16 NOTE — Assessment & Plan Note (Signed)
-   Hold Crestor while on TPN

## 2022-11-16 NOTE — Assessment & Plan Note (Addendum)
Completed 21 days antibiotics.  No further fever, WBC normalized.  CT 4/13 showed resolution of abscesses.  Still with daily abdominal pain requiring opiates - Repeat CT imaging of the abdomen and pelvis

## 2022-11-16 NOTE — Assessment & Plan Note (Addendum)
Developed nausea, inability to tolerate p.o., and abdominal pain about 2 weeks ago.  Imaging at that time showed ileus.  Most recent imaging still showed dilated SB, although symptoms have slowly improved since then.  Having BM.  Still nauseated.  She is self-restricting diet (SLP have cleared for regular consistency diet, she has asked for dysphagia 3 diet), she eats Ensure and supplements well, is averse to other foods.  Has frequent nausea requiring Zofran.    - Mobilize - Miralax/Senna - Stop TPN and follow up CT abdomen

## 2022-11-16 NOTE — Progress Notes (Signed)
Mobility Specialist Progress Note   11/16/22 1600  Mobility  Activity Turned to back - supine (Bed Level Exercises)  Level of Assistance Standby assist, set-up cues, supervision of patient - no hands on  Assistive Device None  Range of Motion/Exercises All extremities  Activity Response Tolerated well  Mobility Referral Yes  $Mobility charge 1 Mobility   Pt expressing to be feeling better today but deferring OOB mobility d/t recent fluid pill causing pt to excessively void. After max encouragement pt agreeable to bed level exercises. Able to tolerate x4 LE exercises and x2 UE exercises in today's session. No complaints during or post mobiltiy. Left w/ call bell in reach and bed alarm on.   Frederico Hamman Mobility Specialist Please contact via SecureChat or  Rehab office at [phone]

## 2022-11-16 NOTE — Progress Notes (Signed)
Progress Note   Patient: [name] YTK:[id number] DOB: 24-Mar-1971 DOA: 10/23/2022     23 DOS: the patient was seen and examined on 11/16/2022        Brief hospital course: [name] is a 52 y.o. F with morbid obesity, sdCHF recovered from 30-35% to 55-60%, hx VTE on Eliquis, hypothyroidism, hx SLE/Sjogrens on Humira, and hx DM with gastroparesis who presented with abdominal pain, found to have sepsis from intra-abdominal abscess, possible TOA vs other intra-abdominal abscess.      Mass not present on CT on 3/15.  Interventional radiology consulted and patient underwent drain placement 3/26.     Patient had been on a limited diet full liquids due to complaints of abdominal pain.  Ileus developped 3/29   Patient has had a prolonged period without eating much, and with persistent ileus, started on PICC line and TPN 4/2.   4/16: drains removed     Assessment and Plan: * Acute metabolic encephalopathy Due to infection, now resolved.  Severe sepsis Presetned with leukocytosis, tachycardic.  Developed encephalopathy and renal failure due to sepsis.   Now stabilized.  Ileus Still nauseated, calorie count <40% meals - Mobilize - Miralax/Senna - Repeat abdomen x-ray - Continue TPN  Intra-abdominal abscess Completed 21 days antibiotics.  No further fever, WBC normalized.  CT 4/13 showed resolution of abscesses.  History of venous thromboembolism Continue apixaban 2.5 at home - Will plan to resume in coming days if no further drain needed  Oral candidiasis Completed course of fluconazole  Hypothyroidism - Continue levothyroxine  Hypomagnesemia Resolved  Hypokalemia Resolved  SLE (systemic lupus erythematosus related syndrome)    Morbid obesity with BMI of 50.0-59.9, adult BMI 56.5  Mood disorder - Continue Seroquel - Hold Xanax, Abilify, Wellbutrin  History of CVA (cerebrovascular accident) - Hold Crestor while on TPN  Normocytic anemia Hemoglobin stable,  no clinical bleeding  Essential hypertension BP up to 130s - Resume torsemide - Hold Coreg for now  Type 2 diabetes mellitus with hyperlipidemia Glucose is controlled - Continue glargine - Continue sliding scale corrections - Hold metformin, Jardiance, semaglutide  HLD (hyperlipidemia) - Hold Crestor  AKI (acute kidney injury) Cr 2.29 on admission, now resolved to baseline 0.8  Chronic combined systolic and diastolic CHF (congestive heart failure) QT prolongation resolved.  Has some mild leg swelling in setting of torsemide being held here the last 3 weeks. - Resume home toresemide - Hold home Coreg for now, Jardiance  SJOGREN'S SYNDROME On azathioprine, hydroxychloroquine and BAFFi as an outpatient - Hold azathioprine, Plaquenil, Benlysta while treating infection          Subjective: Patient still having nausea, abdominal cramping, malaise, fatigue.  No confusion, fever.  Pain overall slightly better.     Physical Exam: BP 137/74 (BP Location: Left Arm)   Pulse 98   Temp 98.6 F (37 C) (Oral)   Resp 18   Ht 5\' 8"  (1.727 m)   Wt (!) 168.8 kg   LMP 03/24/2017   SpO2 99%   BMI 56.58 kg/m   Obese adult female, lying in bed, no acute distress RRR, no murmurs, no peripheral edema Respiratory rate normal, lungs clear without rales or wheezes Abdomen soft, has guarding with palpation, no rigidity or rebound, no focal tenderness Attention normal, affect normal, judgment insight appear normal, generalized weakness    Data Reviewed: Patient metabolic panel yesterday unremarkable Glucose normal Magnesium and phosphate normal Telemetry shows artifact only Hemoglobin up to 9.9 yesterday  Family Communication:  None present    Disposition: Status is: Inpatient Patient still requiring TPN, likely this will be able to be stopped in the next few days and she can discharge to SNF at that time        Author: Alberteen Sam, MD 11/16/2022 3:45  PM  For on call review [email or web address].

## 2022-11-16 NOTE — Assessment & Plan Note (Signed)
Continue levothyroxine 

## 2022-11-16 NOTE — Assessment & Plan Note (Addendum)
Continue apixaban 2.5 at home - Will plan to resume if CT negative

## 2022-11-16 NOTE — Assessment & Plan Note (Signed)
Due to infection, now resolved.

## 2022-11-16 NOTE — Progress Notes (Signed)
PHARMACY - TOTAL PARENTERAL NUTRITION CONSULT NOTE   Indication: Small bowel obstruction and prolonged inability to tolerate PO intake  Patient Measurements: Height:  (172.7 cm) Weight: (!) 168.8 kg (372 lb 2.2 oz) IBW/kg (Calculated) : 63.9 TPN AdjBW (KG): 86.4 Body mass index is 56.58 kg/m.  Assessment:  52 yo female with PMH of Bell's palsy, DM, CHF, hypothyroidism, hx DVT on Eliquis. Recently discharged on 3/21 after admission for abdominal pain and intractable nausea/vomiting. Brought back to ED on 3/24 for AMS and loss of consciousness. Endorsed abdominal pain and nausea/vomiting on day of admission. Pharmacy has been consulted on 4/3 to begin TPN as patient now with evidence of SBO on imaging and prolonged poor PO intake.   Discussed with patient on 4/3, she states that she really has not been able to tolerate a meal since before her previous admission ~3/16. Bowel function returned on 4/10. Diet advanced from full liquids to soft diet on 4/12. Pt only eating 10-30% of meals - limited by nausea, especially after PO contrast given for CTAP on 4/13. States she is really only able to drink liquids when she is taking meds. Intake causes her to experience nausea and abdominal pain. Patient stated that just thinking about food/liquid/PO intake during our discussion made her feel nauseous.   Update from 4/17 with patient that diet improving slowly and that she attempted multiple meals yesterday but still with episodes of emesis afterwards (ate < 50% serving of grits, potato soup, mac & cheese).   Glucose / Insulin: hx T2DM, last A1c 7.4% 05/2022, on U-500 100 units TID, metformin, Jardiance, and Ozempic PTA >> was started on Semglee 100 units BID here but reduced down to Semglee 15 units QD - CBGs 107-154, required 14 units of SSI/24h while on rSSI, 12 units insulin in TPN  Electrolytes: Mag 1.8, K: 4.4, CoCa: 10.1, all others WNL  Renal: Scr <1, BUN 10 Hepatic: LFTs wnl, TG: 75  Intake  / Output: UOP ~0.3 mL/kg/hr + 1 urine occurrence, LBM 4/16, 117 mL PO intake   GI Imaging: - 3/24 CTAP: inflammatory/infectious process such as PID w/ developing abscess, no bowel obstruction - 3/29 Abd X-ray: multiple dilated air-filled loops of small and large bowel, could represent ileus or developing obstruction - 4/2 CTAP: new loculated rim enhancing fluid collection w/in the RLQ, several loops of distal small bowel, developing mid small bowel obstruction - 4/5 Abd Xray: persistent dilated loops of small bowel which may reflect ileus or pSBO - 4/6 Abd Xray: no significant change in small bowel dilation - 4/10 Abd Xray: interval improvement in small bowel dilatation - 4/13 CTAP: results pending GI Surgeries / Procedures:  - 3/26: CT-guided left pelvic abscess drain placement - 4/16: drain removal  Central access: 4/03 TPN start date: 4/03  Nutritional Goals: Goal TPN rate is 90 mL/hr (provides 112 g of protein and 1988 kcals per day)  RD Assessment: Estimated Needs Total Energy Estimated Needs: 1900-2100 kcal/d Total Protein Estimated Needs: 100-120 g/d Total Fluid Estimated Needs: 2L/d  Current Nutrition:  TPN - 1/2 rate 4/16 Dysphagia 3 diet  Plan:  Continue TPN at 45 mL/hr at 1800 providing 56g AA and 994 kcal meeting 50% of nutritional needs Electrolytes in TPN: Na 180mEq/L, K 78mEq/L ( total), Ca 20mEq/L, Mg increased to 15 mEq/L, and Phos 20mmol/L. Cl:Ac changed to 1:2 Add standard MVI and trace elements to TPN Continue decreased insulin at 12 units added to TPN (1/2 TPN), trend CBGs and SSI requirements  with basal insulin per MD Continue resistant q4h SSI and adjust as needed Monitor TPN labs on Mon/Thurs F/u ability to tolerate diet and increase PO intake and wean TPN F/u calorie count - initiated 4/15  Thank you for allowing pharmacy to be a part of this patient's care.  Thelma Barge, PharmD Clinical Pharmacist

## 2022-11-16 NOTE — Assessment & Plan Note (Signed)
Resolved

## 2022-11-16 NOTE — Assessment & Plan Note (Addendum)
Glucose is controlled - Continue glargine - Continue sliding scale corrections - Hold metformin, Jardiance - Stop semaglutide in setting of chronic nausea

## 2022-11-16 NOTE — Progress Notes (Signed)
Calorie Count Note  48 hour calorie count ordered.  Pt in bed drinking on a Boost Breeze at time of visit. Pt states that she has done much better with meals today. Yesterday she consumed bites at mealtime. Today she has consumed most of her entree at both Breakfast and Lunch. She reports that she is eating very slowly. She reports that strong food smells bother her.  She also feels that she can tolerate the Boost Breeze drinks and is committed to drinking 2 per day.   Chart reviewed and spoke with MD covering today. Pt has a documented hx of gastroparesis but unable to find any documented gastric emptying study in the system. Per MD plan is to xray abd to determine if ileus persists or is resolving/resolved. If resolved nausea component could be from gastroparesis.   First noted nausea per pt was with ozempic use in 05/2022. Possible gastroparesis/paralysis associated with ozempic use seen in literature.   04/03 - TPN initiated via PICC 04/10 - Abd xray results report interval improvement in small bowel dilation 04/13 - Abd CT results consistent with improving SBO/ileus  04/16 - TPN decreased to 1/2 rate @ 45 ml/hr providing 994 kcal and 56 grams AA  Noted IV dilaudid has been d/c'ed which can help with motility  MD considering erythromycin for motility as pt is not a candidate for reglan  Recommend mobility with therapy Discussed IV zofran if changes can be made to PO form Discussed food modifications with patient for nausea.  If pt to d/c to SNF would recommend ongoing mobility, anti-nausea meds prior to meals. At SNF would continue to support with meals modified for nausea and ongoing ONS.  If ileus resolved, recommend continued weaning of TPN and supporting diet/ONS intake  Diet: Dysphagia 3 with Thin liquids Supplements: Boost Breeze BID  Breakfast: 160 kcal and 10 grams protein Lunch: 220 kcal and 8 grams protein Supplements: 500 kcal and 18 grams protein  Total intake: (2 meals  and 2 supplements)  880 kcal (46% of minimum estimated needs)  36 protein (36% of minimum estimated needs)   Cammy Copa., RD, LDN, CNSC See AMiON for contact information

## 2022-11-16 NOTE — Progress Notes (Addendum)
Physical Therapy Treatment Patient Details Name: [name] MRN: [id number] DOB: 03-15-1971 Today's Date: 11/16/2022   History of Present Illness Pt is a 52 y.o. F who presents 10/23/2022 with acute metabolic encephalopathy and severe sepsis secondary to intra-abdominal abscess. S/p drain placement 3/26. 3/30 x-ray showing SBO vs ileus. Significant PMH:  morbid obesity with BMI greater than 50, systolic/diastolic CHF with previous ejection fraction of 30 to 35% and grade 1 diastolic dysfunction, chronic right ventricular failure, history of DVT, hypothyroidism, depression and anxiety and chronic abdominal pain.    PT Comments    Pt progressing towards her physical therapy goals. She is now able to perform bed mobility and transfers with increased time, but without physical assist. Ambulating 25 ft, then additional 25 ft with a walker and close chair follow. Fatigues quickly and demonstrates decreased cardiopulmonary endurance, weakness, debility. Pt met 4/4 goals; goals updated. Will continue to progress as tolerated.     Recommendations for follow up therapy are one component of a multi-disciplinary discharge planning process, led by the attending physician.  Recommendations may be updated based on patient status, additional functional criteria and insurance authorization.  Follow Up Recommendations  Can patient physically be transported by private vehicle: No    Assistance Recommended at Discharge PRN  Patient can return home with the following Assistance with cooking/housework;Assist for transportation;Help with stairs or ramp for entrance;A little help with walking and/or transfers;A little help with bathing/dressing/bathroom   Equipment Recommendations  Hospital bed    Recommendations for Other Services       Precautions / Restrictions Precautions Precautions: Fall Restrictions Weight Bearing Restrictions: No     Mobility  Bed Mobility Overal bed mobility: Needs  Assistance Bed Mobility: Supine to Sit     Supine to sit: HOB elevated, Supervision     General bed mobility comments: increased time needed    Transfers Overall transfer level: Needs assistance Equipment used: Rolling walker (2 wheels) Transfers: Sit to/from Stand Sit to Stand: Min guard           General transfer comment: increased time but no assist to power up from EOB or toilet    Ambulation/Gait Ambulation/Gait assistance: Min guard, +2 safety/equipment Gait Distance (Feet): 50 Feet (25", 25") Assistive device: Rolling walker (2 wheels) Gait Pattern/deviations: Decreased stride length, Trunk flexed, Wide base of support Gait velocity: decreased Gait velocity interpretation: <1.31 ft/sec, indicative of household ambulator   General Gait Details: Improved knee stability, slight increase in trunk flexion, slow and steady pace. Min guard for safety, +2 equipment   Stairs             Wheelchair Mobility    Modified Rankin (Stroke Patients Only)       Balance Overall balance assessment: Needs assistance Sitting-balance support: Feet supported, Single extremity supported Sitting balance-Leahy Scale: Fair Sitting balance - Comments: Prefers UE support   Standing balance support: During functional activity, Bilateral upper extremity supported Standing balance-Leahy Scale: Poor Standing balance comment: reliant on Bil UE support                            Cognition Arousal/Alertness: Awake/alert Behavior During Therapy: WFL for tasks assessed/performed Overall Cognitive Status: Within Functional Limits for tasks assessed  Exercises      General Comments        Pertinent Vitals/Pain Pain Assessment Pain Assessment: Faces Faces Pain Scale: Hurts a little bit Pain Location: abdomen Pain Descriptors / Indicators: Tightness, Guarding, Discomfort, Grimacing, Sore Pain  Intervention(s): Monitored during session    Home Living                          Prior Function            PT Goals (current goals can now be found in the care plan section) Acute Rehab PT Goals Time For Goal Achievement: 11/30/22 Potential to Achieve Goals: Fair Progress towards PT goals: Progressing toward goals    Frequency    Min 3X/week      PT Plan Current plan remains appropriate    Co-evaluation              AM-PAC PT "6 Clicks" Mobility   Outcome Measure  Help needed turning from your back to your side while in a flat bed without using bedrails?: A Little Help needed moving from lying on your back to sitting on the side of a flat bed without using bedrails?: A Little Help needed moving to and from a bed to a chair (including a wheelchair)?: A Little Help needed standing up from a chair using your arms (e.g., wheelchair or bedside chair)?: A Little Help needed to walk in hospital room?: A Little Help needed climbing 3-5 steps with a railing? : Total 6 Click Score: 16    End of Session   Activity Tolerance: Patient tolerated treatment well Patient left: in chair;with call bell/phone within reach Nurse Communication: Mobility status PT Visit Diagnosis: Muscle weakness (generalized) (M62.81);Difficulty in walking, not elsewhere classified (R26.2);Pain;Unsteadiness on feet (R26.81);Other abnormalities of gait and mobility (R26.89)     Time: 4098-1191 PT Time Calculation (min) (ACUTE ONLY): 28 min  Charges:  $Therapeutic Activity: 23-37 mins                     Lillia Pauls, PT, DPT Acute Rehabilitation Services Office [phone]    Norval Morton 11/16/2022, 4:28 PM

## 2022-11-16 NOTE — Assessment & Plan Note (Signed)
BMI 56.5

## 2022-11-16 NOTE — Assessment & Plan Note (Addendum)
QT prolongation resolved.  Has some mild leg swelling in setting of torsemide being held here the last 3 weeks. - Continue torsemide - Resume Coreg, Jardiance at d/c

## 2022-11-16 NOTE — Assessment & Plan Note (Signed)
Hemoglobin stable, no clinical bleeding ?

## 2022-11-16 NOTE — Assessment & Plan Note (Signed)
On azathioprine, hydroxychloroquine and BAFFi as an outpatient - Hold azathioprine, Plaquenil, Benlysta while treating infection

## 2022-11-16 NOTE — Assessment & Plan Note (Signed)
Completed course of fluconazole

## 2022-11-16 NOTE — Assessment & Plan Note (Signed)
Cr 2.29 on admission, now resolved to baseline 0.8

## 2022-11-16 NOTE — Assessment & Plan Note (Signed)
Hold Crestor 

## 2022-11-16 NOTE — Assessment & Plan Note (Addendum)
BP normal - Continue torsemide - Resume Coreg at d/c

## 2022-11-16 NOTE — Assessment & Plan Note (Signed)
-   Continue Seroquel - Hold Xanax, Abilify, Wellbutrin

## 2022-11-17 ENCOUNTER — Inpatient Hospital Stay: Payer: Medicaid Other | Admitting: Internal Medicine

## 2022-11-17 ENCOUNTER — Other Ambulatory Visit: Payer: Self-pay | Admitting: Endocrinology

## 2022-11-17 DIAGNOSIS — E1143 Type 2 diabetes mellitus with diabetic autonomic (poly)neuropathy: Secondary | ICD-10-CM

## 2022-11-17 DIAGNOSIS — G9341 Metabolic encephalopathy: Secondary | ICD-10-CM | POA: Diagnosis not present

## 2022-11-17 DIAGNOSIS — K567 Ileus, unspecified: Secondary | ICD-10-CM | POA: Diagnosis not present

## 2022-11-17 DIAGNOSIS — E1165 Type 2 diabetes mellitus with hyperglycemia: Secondary | ICD-10-CM

## 2022-11-17 DIAGNOSIS — K651 Peritoneal abscess: Secondary | ICD-10-CM | POA: Diagnosis not present

## 2022-11-17 DIAGNOSIS — A419 Sepsis, unspecified organism: Secondary | ICD-10-CM | POA: Diagnosis not present

## 2022-11-17 LAB — COMPREHENSIVE METABOLIC PANEL
ALT: 14 U/L (ref 0–44)
AST: 17 U/L (ref 15–41)
Albumin: 2.6 g/dL — ABNORMAL LOW (ref 3.5–5.0)
Alkaline Phosphatase: 91 U/L (ref 38–126)
Anion gap: 12 (ref 5–15)
BUN: 11 mg/dL (ref 6–20)
CO2: 28 mmol/L (ref 22–32)
Calcium: 8.8 mg/dL — ABNORMAL LOW (ref 8.9–10.3)
Chloride: 98 mmol/L (ref 98–111)
Creatinine, Ser: 0.86 mg/dL (ref 0.44–1.00)
GFR, Estimated: >60 mL/min (ref >60)
Glucose, Bld: 166 mg/dL — ABNORMAL HIGH (ref 70–99)
Potassium: 3.5 mmol/L (ref 3.5–5.1)
Sodium: 138 mmol/L (ref 135–145)
Total Bilirubin: 0.5 mg/dL (ref 0.3–1.2)
Total Protein: 6.8 g/dL (ref 6.5–8.1)

## 2022-11-17 LAB — CBC
HCT: 29.4 % — ABNORMAL LOW (ref 36.0–46.0)
Hemoglobin: 9.7 g/dL — ABNORMAL LOW (ref 12.0–15.0)
MCH: 29 pg (ref 26.0–34.0)
MCHC: 33 g/dL (ref 30.0–36.0)
MCV: 88 fL (ref 80.0–100.0)
Platelets: 203 10*3/uL (ref 150–400)
RBC: 3.34 MIL/uL — ABNORMAL LOW (ref 3.87–5.11)
RDW: 15.6 % — ABNORMAL HIGH (ref 11.5–15.5)
WBC: 8.2 10*3/uL (ref 4.0–10.5)
nRBC: 0 % (ref 0.0–0.2)

## 2022-11-17 LAB — GLUCOSE, CAPILLARY
Glucose-Capillary: 141 mg/dL — ABNORMAL HIGH (ref 70–99)
Glucose-Capillary: 126 mg/dL — ABNORMAL HIGH (ref 70–99)
Glucose-Capillary: 156 mg/dL — ABNORMAL HIGH (ref 70–99)
Glucose-Capillary: 169 mg/dL — ABNORMAL HIGH (ref 70–99)

## 2022-11-17 LAB — PHOSPHORUS: Phosphorus: 4.6 mg/dL (ref 2.5–4.6)

## 2022-11-17 LAB — MAGNESIUM: Magnesium: 1.7 mg/dL (ref 1.7–2.4)

## 2022-11-17 LAB — TRIGLYCERIDES: Triglycerides: 83 mg/dL (ref <150)

## 2022-11-17 MED ORDER — TRAVASOL 10 % IV SOLN
INTRAVENOUS | Status: AC
  Filled 2022-11-17: qty 561.6

## 2022-11-17 MED ORDER — BOOST / RESOURCE BREEZE PO LIQD CUSTOM
1.0000 | Freq: Two times a day (BID) | ORAL | Status: DC
  Administered 2022-11-17 – 2022-11-18 (×4): 1 via ORAL

## 2022-11-17 NOTE — Progress Notes (Signed)
Mobility Specialist: Progress Note   11/17/22 1546  Mobility  Activity Transferred from chair to bed  Level of Assistance Minimal assist, patient does 75% or more  Assistive Device Front wheel walker  Distance Ambulated (ft) 2 ft  Activity Response Tolerated well  Mobility Referral Yes  $Mobility charge 1 Mobility   Pt received in the chair and agreeable to mobility. Mod I to stand and contact guard during transfer. C/o back and abdominal pain during transfer. Once sitting EOB pt agreeable to STSx3. MinA to get BLE back into bed at end of session. Pt has call bell and phone at her side. Bed alarm is on.   Danyle Boening Mobility Specialist Please contact via SecureChat or Rehab office at [phone]

## 2022-11-17 NOTE — Progress Notes (Signed)
PHARMACY - TOTAL PARENTERAL NUTRITION CONSULT NOTE   Indication: Small bowel obstruction and prolonged inability to tolerate PO intake  Patient Measurements: Height: 5\' 8"  (172.7 cm) Weight: (!) 164.2 kg (361 lb 15.9 oz) IBW/kg (Calculated) : 63.9 TPN AdjBW (KG): 86.4 Body mass index is 55.04 kg/m.  Assessment:  52 yo female with PMH of Bell's palsy, DM, CHF, hypothyroidism, hx DVT on Eliquis. Recently discharged on 3/21 after admission for abdominal pain and intractable nausea/vomiting. Brought back to ED on 3/24 for AMS and loss of consciousness. Endorsed abdominal pain and nausea/vomiting on day of admission. Pharmacy has been consulted on 4/3 to begin TPN as patient now with evidence of SBO on imaging and prolonged poor PO intake.   Discussed with patient on 4/3, she states that she really has not been able to tolerate a meal since before her previous admission ~3/16. Bowel function returned on 4/10. Diet advanced from full liquids to soft diet on 4/12. Pt only eating 10-30% of meals - limited by nausea, especially after PO contrast given for CTAP on 4/13. States she is really only able to drink liquids when she is taking meds. Intake causes her to experience nausea and abdominal pain. Patient stated that just thinking about food/liquid/PO intake during our discussion made her feel nauseous.   Per 4/17 with patient that diet improving slowly and that she attempted multiple meals yesterday but still with episodes of emesis afterwards (ate < 50% serving of grits, potato soup, mac & cheese). Per RD assessment, diet improving, consuming ~50% kcal aod ~35% protein needs over past 24hr. 4/18 labs pending.  Glucose / Insulin: hx T2DM, last A1c 7.4% 05/2022, on U-500 100 units TID, metformin, Jardiance, and Ozempic PTA >> was started on Semglee 100 units BID here but reduced down to Semglee 15 units QD - CBGs 171, required 23 units of SSI/24h while on rSSI, 12 units insulin in TPN  Electrolytes: Mag  1.8, K: 4.4, CoCa: 10.1, all others WNL  Renal: Scr <1, BUN 10 Hepatic: LFTs wnl, TG: 75  Intake / Output: UOP up 0.6 mL/kg/hr, LBM 4/16   GI Imaging: - 3/24 CTAP: inflammatory/infectious process such as PID w/ developing abscess, no bowel obstruction - 3/29 Abd X-ray: multiple dilated air-filled loops of small and large bowel, could represent ileus or developing obstruction - 4/2 CTAP: new loculated rim enhancing fluid collection w/in the RLQ, several loops of distal small bowel, developing mid small bowel obstruction - 4/5 Abd Xray: persistent dilated loops of small bowel which may reflect ileus or pSBO - 4/6 Abd Xray: no significant change in small bowel dilation - 4/10 Abd Xray: interval improvement in small bowel dilatation - 4/13 CTAP: results pending GI Surgeries / Procedures:  - 3/26: CT-guided left pelvic abscess drain placement - 4/16: drain removal  Central access: 4/03 TPN start date: 4/03  Nutritional Goals: Goal TPN rate is 90 mL/hr (provides 112 g of protein and 1988 kcals per day)  RD Assessment: Estimated Needs Total Energy Estimated Needs: 1900-2100 kcal/d Total Protein Estimated Needs: 100-120 g/d Total Fluid Estimated Needs: 2L/d  Current Nutrition:  TPN - 1/2 rate 4/16 Dysphagia 3 diet  Plan:  Continue TPN at 45 mL/hr at 1800 providing 56g AA and 994 kcal meeting 50% of nutritional needs Electrolytes in TPN: Na 19mEq/L, K 75mEq/L ( total), Ca 89mEq/L, Mg increased to 15 mEq/L, and Phos 85mmol/L. Cl:Ac changed to 1:2 Add standard MVI and trace elements to TPN Continue decreased insulin at 12 units added  to TPN (1/2 TPN), trend CBGs and SSI requirements with basal insulin per MD Continue resistant q4h SSI and adjust as needed Monitor TPN labs on Mon/Thurs F/u ability to tolerate diet and increase PO intake and stop TPN F/u calorie count - initiated 4/15  Thank you for allowing pharmacy to be a part of this patient's care.  Thelma Barge,  PharmD Clinical Pharmacist

## 2022-11-17 NOTE — Progress Notes (Signed)
Calorie Count Note  48 hour calorie count ordered.   Pt in bed during visit. She had ate Breakfast, tolerated fruit well but only 25% of french toast then had nausea.  Pt reports she is drinking the [employer] drinks.  Last BM documented on 4/16, no stool characteristics documented since 4/13 to determine if adequate Per nursing documentation pt has not consumed boost breeze supplements. However, RD has observed pt drinking supplements during visits.   - Continue Boost Breeze BID, prefers these in the evening, orders adjusted.  - Reviewed menu choices with pt - Offer Magic cup at each meal - Recommend continue to wean TPN if CT of abd continues to show improvement - Recommend changing IV zofran to oral medication that can be taken at home/SNF - Discussed importance of adequate nutrition for healing with patient - Encouraged ongoing mobility - Continue bowel regimen for regular bowel movements  04/03 - TPN initiated via PICC 04/10 - Abd xray results report interval improvement in small bowel dilation 04/13 - Abd CT results consistent with improving SBO/ileus  04/16 - TPN decreased to 1/2 rate @ 45 ml/hr providing 994 kcal and 56 grams AA  Diet: Dysphagia 3 with Thin liquids Supplements: Boost Breeze BID   Breakfast: 160 kcal and 10 grams protein Lunch: 220 kcal and 8 grams protein Dinner: no documentation provided Supplements: 500 kcal and 18 grams protein   Total intake: (2 meals and 2 supplements)  880 kcal (46% of minimum estimated needs)  36 protein (36% of minimum estimated needs)   Jsean Taussig P., RD, LDN, CNSC See AMiON for contact information

## 2022-11-17 NOTE — Progress Notes (Signed)
  Progress Note   Patient: [name] TRZ:[id number] DOB: 1970/08/20 DOA: 10/23/2022     24 DOS: the patient was seen and examined on 11/17/2022        Brief hospital course: [name] is a 52 y.o. F with morbid obesity, sdCHF recovered from 30-35% to 55-60%, hx VTE on Eliquis, hypothyroidism, hx SLE/Sjogrens on azathioprine, Benlysta, Plaquenil, and hx DM with gastroparesis who presented with abdominal pain, found to have sepsis from intra-abdominal abscess, possible TOA vs other intra-abdominal abscess.         Assessment and Plan: * Acute metabolic encephalopathy Severe sepsis Intra-abdominal abscess Completed 21 days antibiotics.  No further fever, WBC normalized.  CT 4/13 showed resolution of abscesses.   Ileus With daily nausea, cramping, poor PO intake.  Thankfully, BMs have resumed. - Chceck BMP and CBC again - Obtain CT abdomen   - Continue TPN, taper as able   History of venous thromboembolism Continue apixaban 2.5 at home - Will plan to resume normal if CT normal  Hypothyroidism - Continue levothyroxine   Mood disorder - Continue Seroquel - Hold Xanax, Abilify, Wellbutrin  History of CVA (cerebrovascular accident) - Hold Crestor while on TPN  Essential hypertension BP up to 130s - Continue torsemide - Hold Coreg for now  Type 2 diabetes mellitus with hyperlipidemia Glucose is controlled - Continue glargine - Continue sliding scale corrections - Hold metformin, Jardiance, semaglutide  HLD (hyperlipidemia) - Hold Crestor  AKI (acute kidney injury) Cr 2.29 on admission, now resolved to baseline 0.8  Chronic combined systolic and diastolic CHF (congestive heart failure) QT prolongation resolved.  Has some mild leg swelling in setting of torsemide being held here the last 3 weeks.  No change in leg swelling - Continue home toresemide - Hold home Coreg for now, Jardiance  Lupus SJOGREN'S SYNDROME On azathioprine, hydroxychloroquine and BAFFi  as an outpatient - Hold azathioprine, Plaquenil, Benlysta while treating infection          Subjective: Still with nausea, cramping, fatigue.  Overall gradually improving     Physical Exam: BP 128/69 (BP Location: Left Arm)   Pulse 100   Temp 98.7 F (37.1 C) (Oral)   Resp 19   Ht 5\' 8"  (1.727 m)   Wt (!) 164.2 kg   LMP 03/24/2017   SpO2 100%   BMI 55.04 kg/m   Obese adult female, lying in bed, no acute distress RRR, no murmurs, no peripheral edema Respiratory rate normal, lungs clear without rales or wheezes Abdomen soft, bowel sounds present, some guarding with palpation, most on the right, site of her previous drain looks clean dry and intact, she has no rigidity or rebound, just focal tenderness to palpation on the right and some guarding Attention normal, affect appropriate, judgment insight appear normal, generalized weakness but symmetric strength, face symmetric, speech fluent     Data Reviewed: Unable to obtain labs this morning  Family Communication: None present    Disposition: Status is: Inpatient Patient still requiring TPN, likely this will be able to be stopped in the next few days and she can discharge to SNF or home at that time        Author: Alberteen Sam, MD 11/17/2022 3:29 PM  For on call review [email or web address].

## 2022-11-17 NOTE — TOC Progression Note (Signed)
Transition of Care [facility]) - Progression Note    Patient Details  Name: [name] MRN: [id number] Date of Birth: 06/18/1971  Transition of Care [facility]) CM/SW Contact  Baldemar Lenis, [place] Phone Number: 11/17/2022, 11:49 AM  Clinical Narrative:   CSW met with patient to discuss recommendation for SNF. Patient refusing SNF, wants to go home instead. Patient has a caregiver that comes 7 days a week for about 3-3.5 hours, and her sister will be staying with her when she goes home. Patient has a RW, rollator, and cane, but is asking for 3N1. CSW to reach out to therapies to ask about any additional DME needs for patient to go home. CSW explained difficulty in finding home health for patient, but will try. Patient indicated understanding. CSW to follow.    Expected Discharge Plan: Home w Home Health Services Barriers to Discharge: Continued Medical Work up, Inadequate or no insurance  Expected Discharge Plan and Services     Post Acute Care Choice: Durable Medical Equipment, Home Health Living arrangements for the past 2 months: Single Family Home                                       Social Determinants of Health (SDOH) Interventions SDOH Screenings   Food Insecurity: No Food Insecurity (10/27/2022)  Housing: Low Risk  (10/27/2022)  Transportation Needs: No Transportation Needs (10/27/2022)  Utilities: Not At Risk (10/27/2022)  Tobacco Use: Medium Risk (11/02/2022)    Readmission Risk Interventions    05/26/2021    4:02 PM  Readmission Risk Prevention Plan  Transportation Screening Complete  PCP or Specialist appointment within 3-5 days of discharge Complete  HRI or Home Care Consult Complete  SW Recovery Care/Counseling Consult Complete  Skilled Nursing Facility Not Applicable

## 2022-11-17 NOTE — Progress Notes (Signed)
Occupational Therapy Treatment Patient Details Name: [name] MRN: [id number] DOB: 07-26-1971 Today's Date: 11/17/2022   History of present illness Pt is a 52 y.o. F who presents 10/23/2022 with acute metabolic encephalopathy and severe sepsis secondary to intra-abdominal abscess. S/p drain placement 3/26. 3/30 x-ray showing SBO vs ileus. Significant PMH:  morbid obesity with BMI greater than 50, systolic/diastolic CHF with previous ejection fraction of 30 to 35% and grade 1 diastolic dysfunction, chronic right ventricular failure, history of DVT, hypothyroidism, depression and anxiety and chronic abdominal pain.   OT comments  Patient continues to progress with OT treatment with min guard for bed mobility and is able to perform grooming tasks seated on EOB. Patient requires increased time for bed mobility and transfers due to abdominal pain and weakness but requiring only min guard assist for safety. Patient declining SNF placement and is requesting to go home. Patient has caregiver 7 days a week for 3-3.5 hours and states her sister will stay with her for assistance. Patient would benefit from [facility] to further address bed mobility, self care, and functional transfers. Acute OT to continue to follow.    Recommendations for follow up therapy are one component of a multi-disciplinary discharge planning process, led by the attending physician.  Recommendations may be updated based on patient status, additional functional criteria and insurance authorization.    Assistance Recommended at Discharge Frequent or constant Supervision/Assistance  Patient can return home with the following  A lot of help with bathing/dressing/bathroom;Assistance with cooking/housework;Assist for transportation;Help with stairs or ramp for entrance;Direct supervision/assist for financial management;Direct supervision/assist for medications management;A little help with walking and/or transfers   Equipment Recommendations   BSC/3in1 (bariatric)    Recommendations for Other Services      Precautions / Restrictions Precautions Precautions: Fall Restrictions Weight Bearing Restrictions: No       Mobility Bed Mobility Overal bed mobility: Needs Assistance Bed Mobility: Supine to Sit     Supine to sit: HOB elevated, Supervision     General bed mobility comments: performed with increased time    Transfers Overall transfer level: Needs assistance Equipment used: Rolling walker (2 wheels) Transfers: Sit to/from Stand, Bed to chair/wheelchair/BSC Sit to Stand: Min guard     Step pivot transfers: Min guard     General transfer comment: increased time and min guard for safety     Balance Overall balance assessment: Needs assistance Sitting-balance support: Feet supported, Single extremity supported Sitting balance-Leahy Scale: Fair Sitting balance - Comments: able to perform grooming seated on EOB   Standing balance support: During functional activity, Bilateral upper extremity supported Standing balance-Leahy Scale: Poor Standing balance comment: reliant on RW for support                           ADL either performed or assessed with clinical judgement   ADL Overall ADL's : Needs assistance/impaired     Grooming: Wash/dry hands;Wash/dry face;Oral care;Set up;Sitting Grooming Details (indicate cue type and reason): on EOB                                    Extremity/Trunk Assessment              Vision       Perception     Praxis      Cognition Arousal/Alertness: Awake/alert Behavior During Therapy: WFL for tasks assessed/performed Overall Cognitive  Status: Within Functional Limits for tasks assessed                                          Exercises      Shoulder Instructions       General Comments VSS on RA    Pertinent Vitals/ Pain       Pain Assessment Pain Assessment: Faces Faces Pain Scale: Hurts a little  bit Pain Location: abdomen Pain Descriptors / Indicators: Tightness, Guarding, Discomfort, Grimacing Pain Intervention(s): Monitored during session, Repositioned  Home Living                                          Prior Functioning/Environment              Frequency  Min 2X/week        Progress Toward Goals  OT Goals(current goals can now be found in the care plan section)  Progress towards OT goals: Progressing toward goals  Acute Rehab OT Goals Patient Stated Goal: go home OT Goal Formulation: With patient Time For Goal Achievement: 11/29/22 Potential to Achieve Goals: Fair ADL Goals Pt Will Perform Grooming: standing;with min guard assist Pt Will Perform Lower Body Dressing: with min assist;with adaptive equipment;sit to/from stand Pt Will Transfer to Toilet: with supervision;ambulating Pt/caregiver will Perform Home Exercise Program: Increased strength;Both right and left upper extremity;With written HEP provided;With theraband Additional ADL Goal #1: Pt will complete bed mobility with independnece from flat bed to simulate home setup.  Plan Discharge plan remains appropriate;Frequency remains appropriate    Co-evaluation                 AM-PAC OT "6 Clicks" Daily Activity     Outcome Measure   Help from another person eating meals?: None Help from another person taking care of personal grooming?: A Little Help from another person toileting, which includes using toliet, bedpan, or urinal?: A Little Help from another person bathing (including washing, rinsing, drying)?: A Lot Help from another person to put on and taking off regular upper body clothing?: A Little Help from another person to put on and taking off regular lower body clothing?: A Lot 6 Click Score: 17    End of Session Equipment Utilized During Treatment: Rolling walker (2 wheels)  OT Visit Diagnosis: Other abnormalities of gait and mobility (R26.89);Pain;Muscle  weakness (generalized) (M62.81)   Activity Tolerance Patient tolerated treatment well   Patient Left in chair;with call bell/phone within reach   Nurse Communication Mobility status        Time: 1207-1228 OT Time Calculation (min): 21 min  Charges: OT General Charges $OT Visit: 1 Visit OT Treatments $Self Care/Home Management : 8-22 mins  Alfonse Flavors, OTA Acute Rehabilitation Services  Office [phone]   Dewain Penning 11/17/2022, 2:41 PM

## 2022-11-18 ENCOUNTER — Inpatient Hospital Stay (HOSPITAL_COMMUNITY): Payer: Medicaid Other

## 2022-11-18 DIAGNOSIS — K567 Ileus, unspecified: Secondary | ICD-10-CM | POA: Diagnosis not present

## 2022-11-18 DIAGNOSIS — G9341 Metabolic encephalopathy: Secondary | ICD-10-CM | POA: Diagnosis not present

## 2022-11-18 DIAGNOSIS — A419 Sepsis, unspecified organism: Secondary | ICD-10-CM | POA: Diagnosis not present

## 2022-11-18 DIAGNOSIS — K651 Peritoneal abscess: Secondary | ICD-10-CM | POA: Diagnosis not present

## 2022-11-18 LAB — GLUCOSE, CAPILLARY
Glucose-Capillary: 123 mg/dL — ABNORMAL HIGH (ref 70–99)
Glucose-Capillary: 127 mg/dL — ABNORMAL HIGH (ref 70–99)
Glucose-Capillary: 140 mg/dL — ABNORMAL HIGH (ref 70–99)
Glucose-Capillary: 142 mg/dL — ABNORMAL HIGH (ref 70–99)
Glucose-Capillary: 143 mg/dL — ABNORMAL HIGH (ref 70–99)
Glucose-Capillary: 150 mg/dL — ABNORMAL HIGH (ref 70–99)
Glucose-Capillary: 151 mg/dL — ABNORMAL HIGH (ref 70–99)
Glucose-Capillary: 181 mg/dL — ABNORMAL HIGH (ref 70–99)
Glucose-Capillary: 202 mg/dL — ABNORMAL HIGH (ref 70–99)
Glucose-Capillary: 218 mg/dL — ABNORMAL HIGH (ref 70–99)

## 2022-11-18 MED ORDER — IOHEXOL 350 MG/ML SOLN
100.0000 mL | Freq: Once | INTRAVENOUS | Status: AC | PRN
  Administered 2022-11-18: 100 mL via INTRAVENOUS

## 2022-11-18 MED ORDER — PROCHLORPERAZINE EDISYLATE 10 MG/2ML IJ SOLN
10.0000 mg | Freq: Once | INTRAMUSCULAR | Status: AC
  Administered 2022-11-18: 10 mg via INTRAVENOUS
  Filled 2022-11-18: qty 2

## 2022-11-18 MED ORDER — IOHEXOL 9 MG/ML PO SOLN
500.0000 mL | ORAL | Status: AC
  Administered 2022-11-18: 500 mL via ORAL

## 2022-11-18 NOTE — Progress Notes (Signed)
PHARMACY - TOTAL PARENTERAL NUTRITION CONSULT NOTE   Indication: Small bowel obstruction and prolonged inability to tolerate PO intake  Patient Measurements: Height:  (172.7 cm) Weight: (!) 164.2 kg (361 lb 15.9 oz) IBW/kg (Calculated) : 63.9 TPN AdjBW (KG): 86.4 Body mass index is 55.04 kg/m.  Assessment:  52 yo female with PMH of Bell's palsy, DM, CHF, hypothyroidism, hx DVT on Eliquis. Recently discharged on 3/21 after admission for abdominal pain and intractable nausea/vomiting. Brought back to ED on 3/24 for AMS and loss of consciousness. Endorsed abdominal pain and nausea/vomiting on day of admission. Pharmacy has been consulted on 4/3 to begin TPN as patient now with evidence of SBO on imaging and prolonged poor PO intake.   Discussed with patient on 4/3, she states that she really has not been able to tolerate a meal since before her previous admission ~3/16. Bowel function returned on 4/10. Diet advanced from full liquids to soft diet on 4/12. Pt only eating 10-30% of meals - limited by nausea, especially after PO contrast given for CTAP on 4/13. States she is really only able to drink liquids when she is taking meds. Intake causes her to experience nausea and abdominal pain. Patient stated that just thinking about food/liquid/PO intake during our discussion made her feel nauseous.   Per 4/17 with patient that diet improving slowly and that she attempted multiple meals yesterday but still with episodes of emesis afterwards (ate < 50% serving of grits, potato soup, mac & cheese). Per RD assessment, diet improving, consuming ~50% kcal aod ~35% protein needs over past 24hr. 4/18 labs pending.  Glucose / Insulin: hx T2DM, last A1c 7.4% 05/2022, on U-500 100 units TID, metformin, Jardiance, and Ozempic PTA >> was started on Semglee 100 units BID here but reduced down to Semglee 15 units QD - CBGs 171, required 23 units of SSI/24h while on rSSI, 12 units insulin in TPN  Electrolytes: Mag  1.8, K: 4.4, CoCa: 10.1, all others WNL  Renal: Scr <1, BUN 10 Hepatic: LFTs wnl, TG: 75  Intake / Output: UOP up 0.6 mL/kg/hr, LBM 4/16   GI Imaging: - 3/24 CTAP: inflammatory/infectious process such as PID w/ developing abscess, no bowel obstruction - 3/29 Abd X-ray: multiple dilated air-filled loops of small and large bowel, could represent ileus or developing obstruction - 4/2 CTAP: new loculated rim enhancing fluid collection w/in the RLQ, several loops of distal small bowel, developing mid small bowel obstruction - 4/5 Abd Xray: persistent dilated loops of small bowel which may reflect ileus or pSBO - 4/6 Abd Xray: no significant change in small bowel dilation - 4/10 Abd Xray: interval improvement in small bowel dilatation - 4/13 CTAP: results pending GI Surgeries / Procedures:  - 3/26: CT-guided left pelvic abscess drain placement - 4/16: drain removal  Central access: 4/03 TPN start date: 4/03  Nutritional Goals: Goal TPN rate is 90 mL/hr (provides 112 g of protein and 1988 kcals per day)  RD Assessment: Estimated Needs Total Energy Estimated Needs: 1900-2100 kcal/d Total Protein Estimated Needs: 100-120 g/d Total Fluid Estimated Needs: 2L/d  Current Nutrition:  TPN - 1/2 rate 4/16 Dysphagia 3 diet  Plan:  Stop TPN after current bag completes at 1759  Trend CBGs and SSI requirements with basal insulin per MD Continue resistant q4h SSI and adjust as needed  Thank you for allowing pharmacy to be a part of this patient's care.  Thelma Barge, PharmD Clinical Pharmacist

## 2022-11-18 NOTE — Progress Notes (Signed)
Calorie Count Note  48 hour calorie count ordered.   Pt in bed during visit. She had ate Breakfast.  Pt reports she is drinking the Boost Breeze drinks.  She liked the magic cup and has ate them at meal times.  Pt has been tolerating mac and cheese, fruit, and magic cup at Lunch/Dinner  Last BM documented on 4/16, no stool characteristics documented since 4/13 to determine if adequate CT of abd pending   - Continue Boost Breeze BID, prefers these in the evening, orders adjusted.  - Continue Magic cup supplement at all meals  - Reviewed menu choices with pt - Discussed importance of adequate nutrition for healing with patient - Encouraged ongoing mobility - Continue bowel regimen for regular bowel movements - TPN d/c'ed, CT of abd pending  04/03 - TPN initiated via PICC 04/10 - Abd xray results report interval improvement in small bowel dilation 04/13 - Abd CT results consistent with improving SBO/ileus  04/16 - TPN decreased to 1/2 rate @ 45 ml/hr providing 994 kcal and 56 grams AA 04/19 - TPN d/c'ed    Diet: Dysphagia 3 with Thin liquids Supplements: Boost Breeze BID, Magic Cup TID   Breakfast: 200 kcal and 5 grams protein Lunch: 210 kcal and 8 grams protein Dinner: 210 kcal and 8 grams protein Supplements: 1370 kcal and 45 grams protein   Total intake:  2000 kcal (100% of minimum estimated needs)  66 protein (66% of minimum estimated needs)   Leonte Horrigan P., RD, LDN, CNSC See AMiON for contact information

## 2022-11-18 NOTE — Progress Notes (Signed)
Progress Note   Patient: [name] WUJ:[id number] DOB: 12-Feb-1971 DOA: 10/23/2022     25 DOS: the patient was seen and examined on 11/18/2022 at 9:20AM      Brief hospital course: Mrs. Slaven is a 52 y.o. F with morbid obesity, sdCHF recovered from 30-35% to 55-60%, hx VTE on Eliquis, hypothyroidism, hx SLE/Sjogrens on azathioprine, Benlysta, Plaquenil, and hx DM with gastroparesis who presented with abdominal pain, found to have sepsis from intra-abdominal abscess, possible TOA vs other intra-abdominal abscess.         Assessment and Plan: * Acute metabolic encephalopathy Due to infection, now resolved.  Severe sepsis Presented with leukocytosis, tachycardia.  Developed encephalopathy and renal failure due to sepsis.   Now resolved.  Ileus Developed nausea, inability to tolerate p.o., and abdominal pain about 2 weeks ago.  Imaging at that time showed ileus.  Most recent imaging still showed dilated SB, although symptoms have slowly improved since then.  Having BM.  Still nauseated.  She is self-restricting diet (SLP have cleared for regular consistency diet, she has asked for dysphagia 3 diet), she eats Ensure and supplements well, is averse to other foods.  Has frequent nausea requiring Zofran.    - Mobilize - Miralax/Senna - Stop TPN and follow up CT abdomen  Intra-abdominal abscess Completed 21 days antibiotics.  No further fever, WBC normalized.  CT 4/13 showed resolution of abscesses.  Still with daily abdominal pain requiring opiates - Repeat CT imaging of the abdomen and pelvis  History of venous thromboembolism Continue apixaban 2.5 at home - Will plan to resume if CT negative  Oral candidiasis Completed course of fluconazole  Hypothyroidism - Continue levothyroxine  Hypomagnesemia Resolved  Hypokalemia Resolved  SLE (systemic lupus erythematosus related syndrome)    Morbid obesity with BMI of 50.0-59.9, adult BMI 56.5  Mood disorder - Continue  Seroquel - Hold Xanax, Abilify, Wellbutrin  History of CVA (cerebrovascular accident) - Hold Crestor while on TPN  Normocytic anemia Hemoglobin stable, no clinical bleeding  Essential hypertension BP normal - Continue torsemide - Resume Coreg at d/c  Type 2 diabetes mellitus with hyperlipidemia Glucose is controlled - Continue glargine - Continue sliding scale corrections - Hold metformin, Jardiance - Stop semaglutide in setting of chronic nausea  HLD (hyperlipidemia) - Hold Crestor  AKI (acute kidney injury) Cr 2.29 on admission, now resolved to baseline 0.8  Chronic combined systolic and diastolic CHF (congestive heart failure) QT prolongation resolved.  Has some mild leg swelling in setting of torsemide being held here the last 3 weeks. - Continue torsemide - Resume Coreg, Jardiance at d/c  SJOGREN'S SYNDROME On azathioprine, hydroxychloroquine and BAFFi as an outpatient - Hold azathioprine, Plaquenil, Benlysta while treating infection          Subjective: Still has nausea.  She has had no fever.  She is having bowel movements.  No vomiting.  No confusion, respiratory symptoms.  Swelling is better.     Physical Exam: BP 125/70 (BP Location: Left Arm)   Pulse 97   Temp 98.7 F (37.1 C) (Oral)   Resp 18   Ht  (1.727 m)   Wt (!) 164.2 kg   LMP 03/24/2017   SpO2 100%   BMI 55.04 kg/m   Adult female, lying in bed, interactive and appropriate RRR, no murmurs, no peripheral edema Respiratory normal, lungs clear without rales or wheezes Abdomen with tenderness, nonfocal, no guarding, no rigidity, no rebound, no distention, exam limited by body habitus Attention normal,  affect normal, judgment insight appear normal, generalized weakness with symmetric strength, face symmetric, speech fluent    Data Reviewed: Discussed with pharmacy Follow-up panel shows normal sodium, potassium, renal function, magnesium, and LFTs Albumin slightly  low Triglycerides normal Transaminases resolved CBC shows no leukocytosis, normal platelets, mild anemia  Family Communication: None    Disposition: Status is: Inpatient Likely transition off TPN today, if ileus resolved, will resume anticoagulation should be medically ready for discharge tomorrow        Author: Alberteen Sam, MD 11/18/2022 1:58 PM  For on call review [email or web address].

## 2022-11-18 NOTE — Progress Notes (Signed)
Physical Therapy Treatment Patient Details Name: [name] MRN: [id number] DOB: 21-Nov-1970 Today's Date: 11/18/2022   History of Present Illness Pt is a 52 y.o. F who presents 10/23/2022 with acute metabolic encephalopathy and severe sepsis secondary to intra-abdominal abscess. S/p drain placement 3/26. 3/30 x-ray showing SBO vs ileus. Significant PMH:  morbid obesity with BMI greater than 50, systolic/diastolic CHF with previous ejection fraction of 30 to 35% and grade 1 diastolic dysfunction, chronic right ventricular failure, history of DVT, hypothyroidism, depression and anxiety and chronic abdominal pain.    PT Comments    Pt seen for PT tx with pt agreeable, mobility specialist providing chair follow for safety/rest breaks as pt needs. Pt confirms she has an aide & her sister plans to assist at d/c & pt is hopeful to go home with HHPT vs SNF rehab. Pt is able to complete bed mobility with supervision with hospital bed features. Pt performs peri hygiene 2/2 purewick malfunction with pt in standing with UE support on RW & supervision for balance. Pt is able to ambulate increased distances with RW & CGA fade to supervision with cuing for upright posture & forward gaze. Pt is able to negotiate 10 steps with B rails with supervision with ability to demonstrate compensatory pattern. Pt appears & endorses being nervous with mobility with PT providing encouragement throughout session. Continue to recommend ongoing PT services to address balance, strengthening, endurance, gait & stairs.    Recommendations for follow up therapy are one component of a multi-disciplinary discharge planning process, led by the attending physician.  Recommendations may be updated based on patient status, additional functional criteria and insurance authorization.  Follow Up Recommendations  Can patient physically be transported by private vehicle: Yes    Assistance Recommended at Discharge PRN  Patient can return home  with the following Assistance with cooking/housework;Help with stairs or ramp for entrance;Assist for transportation;A little help with walking and/or transfers;A little help with bathing/dressing/bathroom   Equipment Recommendations  Hospital bed    Recommendations for Other Services       Precautions / Restrictions Precautions Precautions: Fall Restrictions Weight Bearing Restrictions: No     Mobility  Bed Mobility Overal bed mobility: Needs Assistance Bed Mobility: Supine to Sit     Supine to sit: Supervision, HOB elevated     General bed mobility comments: uses bed rails, HOB elevated to complete supine>sit    Transfers Overall transfer level: Needs assistance Equipment used: Rolling walker (2 wheels) Transfers: Sit to/from Stand Sit to Stand: Min guard, Supervision           General transfer comment: STS from EOB, recliner with slightly extra time to power to standing    Ambulation/Gait Ambulation/Gait assistance: Min guard, Supervision (chair follow for safety/rest breaks PRN, CGA fade to supervision) Gait Distance (Feet):  (70 ft + 90 ft) Assistive device: Rolling walker (2 wheels) Gait Pattern/deviations: Decreased step length - right, Decreased stride length, Decreased step length - left Gait velocity: decreased     General Gait Details: Cuing & good return demo for upright posture & forward vs downward gaze, slow steady gait.   Stairs Stairs: Yes Stairs assistance: Supervision, Min guard Stair Management: Two rails Number of Stairs: 10 (3" + 6") General stair comments: PT educates pt on compensatory step to pattern. Pt notes first 3 steps she has L rail, last 4 steps she has B rails. Pt is able to negotiate 10 steps with B rails & CGA fade to supervision with extra  time PRN.   Wheelchair Mobility    Modified Rankin (Stroke Patients Only)       Balance Overall balance assessment: Needs assistance Sitting-balance support: Feet  supported Sitting balance-Leahy Scale: Good     Standing balance support: During functional activity, Bilateral upper extremity supported Standing balance-Leahy Scale: Fair                              Cognition Arousal/Alertness: Awake/alert Behavior During Therapy: WFL for tasks assessed/performed Overall Cognitive Status: Within Functional Limits for tasks assessed                                 General Comments: Pt appears anxious with mobility, PT provides encouragement throughout session.        Exercises      General Comments General comments (skin integrity, edema, etc.): SpO2 dropped to 88% after gait x 90 ft with pt recovering with seated rest. Max HR 131 bpm during gait.      Pertinent Vitals/Pain Pain Assessment Pain Assessment: No/denies pain    Home Living                          Prior Function            PT Goals (current goals can now be found in the care plan section) Acute Rehab PT Goals Patient Stated Goal: go home PT Goal Formulation: With patient Time For Goal Achievement: 11/30/22 Potential to Achieve Goals: Good Progress towards PT goals: Progressing toward goals    Frequency    Min 3X/week      PT Plan Discharge plan needs to be updated    Co-evaluation              AM-PAC PT "6 Clicks" Mobility   Outcome Measure  Help needed turning from your back to your side while in a flat bed without using bedrails?: A Little Help needed moving from lying on your back to sitting on the side of a flat bed without using bedrails?: A Little Help needed moving to and from a bed to a chair (including a wheelchair)?: A Little Help needed standing up from a chair using your arms (e.g., wheelchair or bedside chair)?: A Little Help needed to walk in hospital room?: A Little Help needed climbing 3-5 steps with a railing? : A Little 6 Click Score: 18    End of Session   Activity Tolerance: Patient  tolerated treatment well Patient left: in chair;with call bell/phone within reach   PT Visit Diagnosis: Muscle weakness (generalized) (M62.81);Difficulty in walking, not elsewhere classified (R26.2);Unsteadiness on feet (R26.81);Other abnormalities of gait and mobility (R26.89)     Time: 9147-8295 PT Time Calculation (min) (ACUTE ONLY): 27 min  Charges:  $Gait Training: 8-22 mins $Therapeutic Activity: 8-22 mins                     Aleda Grana, PT, DPT 11/18/22, 3:01 PM  Sandi Mariscal 11/18/2022, 2:58 PM

## 2022-11-19 DIAGNOSIS — G9341 Metabolic encephalopathy: Secondary | ICD-10-CM | POA: Diagnosis not present

## 2022-11-19 LAB — URINALYSIS, W/ REFLEX TO CULTURE (INFECTION SUSPECTED)
Bacteria, UA: NONE SEEN
Bilirubin Urine: NEGATIVE
Glucose, UA: NEGATIVE mg/dL
Hgb urine dipstick: NEGATIVE
Ketones, ur: NEGATIVE mg/dL
Leukocytes,Ua: NEGATIVE
Nitrite: NEGATIVE
Protein, ur: 30 mg/dL — AB
Specific Gravity, Urine: 1.03 (ref 1.005–1.030)
pH: 7 (ref 5.0–8.0)

## 2022-11-19 LAB — GLUCOSE, CAPILLARY
Glucose-Capillary: 126 mg/dL — ABNORMAL HIGH (ref 70–99)
Glucose-Capillary: 168 mg/dL — ABNORMAL HIGH (ref 70–99)

## 2022-11-19 NOTE — TOC Transition Note (Addendum)
Transition of Care [facility]) - CM/SW Discharge Note   Patient Details  Name: CHIARA COLTRIN MRN: [id number] Date of Birth: [birth year]  [facility] CM/SW Contact:  Lawerance Sabal, RN Phone Number: 11/19/2022, 12:38 PM   Clinical Narrative:     Spoke to patient over the phone.  Explained we are unable to arrange any further [facility] services, but she can resume with her private duty aid established prior to admission. MD aware by secure chat.  She requests bari 3/1 to be delivered to her home, address verified and [employer] will deliver the home. No other DME needs identified per patient.  Her sister will provide transportation home   Final next level of care: Home/Self Care Barriers to Discharge: No Barriers Identified   Patient Goals and CMS Choice CMS Medicare.gov Compare Post Acute Care list provided to:: Patient Choice offered to / list presented to : Patient  Discharge Placement                         Discharge Plan and Services Additional resources added to the After Visit Summary for       Post Acute Care Choice: Durable Medical Equipment, Home Health          DME Arranged: Bedside commode DME Agency: [employer] Date DME Agency Contacted: 11/19/22 Time DME Agency Contacted: [id number] Representative spoke with at DME Agency: Leavy Cella            Social Determinants of Health (SDOH) Interventions SDOH Screenings   Food Insecurity: No Food Insecurity (10/27/2022)  Housing: Low Risk  (10/27/2022)  Transportation Needs: No Transportation Needs (10/27/2022)  Utilities: Not At Risk (10/27/2022)  Tobacco Use: Medium Risk (11/02/2022)     Readmission Risk Interventions    05/26/2021    4:02 PM  Readmission Risk Prevention Plan  Transportation Screening Complete  PCP or Specialist appointment within 3-5 days of discharge Complete  HRI or Home Care Consult Complete  SW Recovery Care/Counseling Consult Complete  Skilled Nursing Facility Not Applicable

## 2022-11-19 NOTE — Discharge Summary (Signed)
Physician Discharge Summary   Patient: [name] WESTRY MRN: [id number] DOB: 11/04/1970  Admit date:     10/23/2022  Discharge date: 11/19/22  Discharge Physician: Alberteen Sam   PCP: Verlon Au, MD     Recommendations at discharge:  Follow up with General Surgery in 1-2 weeks for evaluation of possible colovesicular fistula Follow up with Urology for possible asymptomatic colovesicular fistula Follow up with PCP Dr. Leavy Cella in 1 week after sepsis from intra-abdominal abscess Follow up with OB-Gyn for annual exam Follow up with Rheumatology for resumption of Benlysta and Imuran     Discharge Diagnoses: Principal Problem:   Acute metabolic encephalopathy Active Problems:   Severe sepsis   Intra-abdominal abscess   Ileus   Possible colovesicular fistula   SJOGREN'S SYNDROME   Chronic combined systolic and diastolic CHF (congestive heart failure)   AKI (acute kidney injury)   HLD (hyperlipidemia)   Type 2 diabetes mellitus with hyperlipidemia   Essential hypertension   Normocytic anemia   History of CVA (cerebrovascular accident)   Mood disorder   Morbid obesity with BMI of 50.0-59.9, adult   SLE (systemic lupus erythematosus related syndrome)   Hypokalemia   Hypomagnesemia   Hypothyroidism   Oral candidiasis   History of venous thromboembolism          Hospital Course: [name] is a 52 [name].o. F with morbid obesity, sdCHF recovered from 30-35% to 55-60%, hx VTE on Eliquis, hypothyroidism, hx SLE/Sjogrens on azathioprine, Benlysta, Plaquenil, and hx DM with gastroparesis who presented with abdominal pain, found to have sepsis from intra-abdominal abscess, possible TOA vs other intra-abdominal abscess.    3/16-3/21: Admitted 1 month prior to this admission for pain, vomiting, CT at that time negative, improved and d/c'd home  Returned twice after that with pain, vomiting and on second time had elevated WBC, syncope and CT repeated that now showed a new  fluid collection.  There was initially some radiographic concern for tubo-ovarian abscess and so OB-Gyn and General Surgery were consulted.        * Acute metabolic encephalopathy Initial presenting complaint.  Due to infection, now resolved.  Severe sepsis due to intra-abdominal abscesses Presented with leukocytosis, tachycardia.  Developed encephalopathy and renal failure due to sepsis.     Gen Surg, Ob Gyn and IR consulted.  Given she is not sexually active, the presence of two fluid collections and the location, OB-Gyn felt this was likely not TOA.  General Surgery recommended percutaneous drainage, and so twwo percutaneous drains were placed, one on 3/26 and on on 4/4.  Fever resolved, WBC normalized.  CT 4/13 showed resolution of abscesses and drains were removed 4/16.  Repeat CT on 4/19 showed confirmed resolution of abscess (and ileus) but did show bladder abnormality (see below)    Possible colovesicular fistula Due to ongoing pain after drain removal, another CT was obtained 4/19 that showed perivesicular stranding and intramural gas.  This was reviewed with Urology and General Surgery.    UA showed no WBC, bacteria or RBCs, and patient denied urinary irritative symptoms, or fecaluria or pneumaturia. In light of that, Urology recommended General Srugery follow up and General Surgery recommended watchful waiting.    A CT with rectal contrast could not be completed for logistical reasons at the time of discharge, but patient was instructed to follow up with General Surgery and given return precautions for colovesical fistula.      Ileus After placement of her first drain, patient complained of  abdominal pain, nausea, and food aversion and was diagnosed with ileus.    This was treated with TPN for about two weeks, but TPN eventually weaned off and patient tolerated diet well under supervision of dietitian.    History of venous thromboembolism Resumed apixaban at  discharge.  Oral candidiasis Completed course of fluconazole  SLE (systemic lupus erythematosus related syndrome) SJOGREN'S SYNDROME On azathioprine, hydroxychloroquine and BAFFi as an outpatient  Given ?colovesical fistula and absence of current SLE symptoms, recommend holding Imuran and Benlysta at time of discharge until Gen Surg follow up can be arranged and she can obtain input from her Rheumatologist.     Morbid obesity with BMI of 50.0-59.9, adult BMI 56.5  Type 2 diabetes mellitus with hyperlipidemia Avoid Jardiance with ?vesical fistula.  Avoid semaglutide in patient with years of recurrent episodic vomiting/abdominal pain episodes.     AKI (acute kidney injury) Cr 2.29 on admission, now resolved to baseline 0.8  Chronic combined systolic and diastolic CHF (congestive heart failure) EF recovered as above.  Discharged back on torsemide.             The [facility] Controlled Substances Registry was reviewed for this patient prior to discharge.   Consultants:  General Surgery OB-Gyn IR  Procedures performed: CT abdomen and pelvis   Disposition: Home Diet recommendation:  Cardiac and Carb modified diet  DISCHARGE MEDICATION: Allergies as of 11/19/2022       Reactions   Metoclopramide Other (See Comments)   Developed restless leg, akathisia type limb movements.    Codeine Itching, Rash   Hydrocodone Rash   Percocet [oxycodone-acetaminophen] Hives, Rash   Tolerates dilaudid Tolerates acetaminophen         Medication List     STOP taking these medications    Azathioprine 75 MG Tabs   Benlysta 200 MG/ML Soaj Generic drug: Belimumab   Jardiance 10 MG Tabs tablet Generic drug: empagliflozin   oxybutynin 5 MG 24 hr tablet Commonly known as: DITROPAN-XL   Semaglutide (2 MG/DOSE) 8 MG/3ML Sopn       TAKE these medications    Advair HFA 230-21 MCG/ACT inhaler Generic drug: fluticasone-salmeterol Inhale 2 puffs into the lungs 2 (two)  times daily.   albuterol (2.5 MG/3ML) 0.083% nebulizer solution Commonly known as: PROVENTIL Take 2.5 mg by nebulization as needed for wheezing.   ALPRAZolam 0.5 MG tablet Commonly known as: XANAX Take 0.5 mg by mouth as needed for anxiety or sleep.   apixaban 2.5 MG Tabs tablet Commonly known as: ELIQUIS Take 1 tablet (2.5 mg total) by mouth 2 (two) times daily.   ARIPiprazole 5 MG tablet Commonly known as: ABILIFY   B-D UF III MINI PEN NEEDLES 31G X 5 MM Misc Generic drug: Insulin Pen Needle USE AS DIRECTED TWICE A DAY   Easy Touch Pen Needles 32G X 4 MM Misc Generic drug: Insulin Pen Needle USE TO INJECT INSULIN TWICE DAILY   buPROPion 300 MG 24 hr tablet Commonly known as: WELLBUTRIN XL Take 300 mg by mouth daily.   carvedilol 25 MG tablet Commonly known as: COREG Take 1.5 tablets (37.5 mg total) by mouth 2 (two) times daily with a meal.   cromolyn 4 % ophthalmic solution Commonly known as: OPTICROM Place 1 drop into both eyes 2 (two) times daily.   cycloSPORINE 0.05 % ophthalmic emulsion Commonly known as: RESTASIS Place 1 drop into both eyes in the morning, at noon, and at bedtime.   diclofenac Sodium 1 % Gel Commonly  known as: VOLTAREN APPLY 2 GRAMS TO AFFECTED AREA 4 TIMES A DAY What changed: See the new instructions.   famotidine 20 MG tablet Commonly known as: PEPCID Take 20 mg by mouth 2 (two) times daily.   fluticasone 50 MCG/ACT nasal spray Commonly known as: FLONASE Place 2 sprays into both nostrils continuous as needed for allergies or rhinitis.   FreeStyle Libre 3 Sensor Misc 1 Device by Does not apply route every 14 (fourteen) days. Apply 1 sensor on upper arm every 14 days for continuous glucose monitoring   HumuLIN R U-500 KwikPen 500 UNIT/ML KwikPen Generic drug: insulin regular human CONCENTRATED 100 UNITS, 30 MINUTES BEFORE EACH MEAL What changed: See the new instructions.   hydroxychloroquine 200 MG tablet Commonly known as:  PLAQUENIL Take 200 mg by mouth 2 (two) times daily.   ibuprofen 600 MG tablet Commonly known as: ADVIL Take 1 tablet (600 mg total) by mouth every 6 (six) hours as needed. What changed: reasons to take this   Insulin Syringe-Needle U-100 31G X 5/16" 1 ML Misc Commonly known as: Easy Touch Insulin Syringe USE AS DIRECTED THREE TIMES A DAY   levothyroxine 50 MCG tablet Commonly known as: SYNTHROID Take 1 tablet (50 mcg total) by mouth daily at 6 (six) AM.   megestrol 40 MG tablet Commonly known as: MEGACE Take 40 mg by mouth daily.   metFORMIN 500 MG 24 hr tablet Commonly known as: GLUCOPHAGE-XR Take 2 tablets (1,000 mg total) by mouth 2 (two) times daily.   multivitamin with minerals Tabs tablet Take 1 tablet by mouth daily.   ondansetron 4 MG disintegrating tablet Commonly known as: ZOFRAN-ODT Take 1 tablet (4 mg total) by mouth every 8 (eight) hours as needed for nausea or vomiting.   OneTouch Ultra test strip Generic drug: glucose blood Test blood sugar 4 times a day What changed: Another medication with the same name was changed. Make sure you understand how and when to take each.   Accu-Chek Guide test strip Generic drug: glucose blood USE TO TEST BLOOD SUGAR 4 TIMES DAILY What changed: See the new instructions.   onetouch ultrasoft lancets Test blood sugar 4 times a day   pregabalin 75 MG capsule Commonly known as: LYRICA Take 75 mg 3 (three) times daily by mouth.   prochlorperazine 10 MG tablet Commonly known as: COMPAZINE Take 1 tablet (10 mg total) by mouth every 6 (six) hours as needed for nausea or vomiting.   promethazine 25 MG suppository Commonly known as: PHENERGAN Place 1 suppository (25 mg total) rectally every 6 (six) hours as needed for nausea or vomiting.   QUEtiapine 300 MG tablet Commonly known as: SEROQUEL Take 300 mg by mouth at bedtime.   rosuvastatin 20 MG tablet Commonly known as: CRESTOR Take 1 tablet (20 mg total) by mouth  daily.   sucralfate 1 GM/10ML suspension Commonly known as: CARAFATE Take 10 mLs by mouth 2 (two) times daily as needed (abdomen pain).   topiramate 25 MG tablet Commonly known as: TOPAMAX Take 75 mg by mouth at bedtime.   torsemide 20 MG tablet Commonly known as: DEMADEX Take 1 tablet (20 mg total) by mouth at bedtime.   Transderm-Scop 1 MG/3DAYS Generic drug: scopolamine Place 1 patch (1.5 mg total) onto the skin every 3 (three) days. As needed for nausea   Vitamin D (Ergocalciferol) 1.25 MG (50000 UNIT) Caps capsule Commonly known as: DRISDOL Take 50,000 Units by mouth once a week.  Durable Medical Equipment  (From admission, onward)           Start     Ordered   11/19/22 1237  For home use only DME Bedside commode  Once       Comments: Bariatric 360 pounds  Patient confined to room without bathroom  Question:  Patient needs a bedside commode to treat with the following condition  Answer:  Weakness   11/19/22 1238            Follow-up Information     Surgery, [place]. Call.   Specialty: General Surgery Why: For follow up of possible colovesical fistula Contact information: [address] [place] [address] [phone]         Gypsy Balsam, MD. Call in 2 day(s).   Specialty: Internal Medicine Why: About when to resume [facility] Contact information: [address] [place] [address] [phone]         [facility]. Schedule an appointment as soon as possible for a visit in 1 week(s).   Why: for follow up with a bladder specialist Contact information: [address] [place] [address] [phone]                Discharge Instructions     Discharge instructions   Complete by: As directed    **IMPORTANT DISCHARGE INSTRUCTIONS**   From Dr. Maryfrances Bunnell: You were admitted for two abscesses (infection collections) in the  belly.  You were evaluated by a general surgeon because sometimes these abscesses need surgical drainage.    Thankfully, we were able to completely treat your abscesses with drains through the skin, and antibiotics.  On repeat imaging with a CT, the fluid collections were all gone.  There WAS a small area of inflammation near your bladder.  I discussed this with our bladder surgeons (Urologists) and colon surgeons and they felt that it was best not to do anything about it now, but to watch it.  To watch it:  Call [facility] (see below) on Monday to schedule an appointment as soon as you can Call [facility] (also see below) for an appointment as well  While you are working on figuring out if this is an issue or not, watch for FEVER, pain with urination that gets worse and worse, brown urine, or blood in the urine Report changes to your urine to Dr. Leavy Cella (or if severe, come to the ER)  ALSO: Because Jardiance and oxybutynin can cause bladder problems, do NOT take Jardiance or oxybutynin until the bladder issue is resolved  In addition, you may resume Plaquenil, but HOLD (do not take) the Imuran or Benlysta until Dr. Artis Flock tells you to restart   Lastly, take a Boost breeze supplement daily for the next month Semaglutide (Ozempic/Wegovy) can cause stomach cramps and vomiting, so maybe avoid this for now   Increase activity slowly   Complete by: As directed    No wound care   Complete by: As directed        Discharge Exam: Filed Weights   11/15/22 0500 11/17/22 0500 11/19/22 0500  Weight: (!) 168.8 kg (!) 164.2 kg (!) 163.7 kg    General: Pt is alert, awake, not in acute distress Cardiovascular: RRR, nl S1-S2, no murmurs appreciated.   No LE edema.   Respiratory: Normal respiratory rate and rhythm.  CTAB without rales or wheezes. Abdominal: Abdomen soft and non-tender.  No distension  or HSM.   Neuro/Psych: Strength symmetric in upper and lower extremities.   Judgment and insight appear normal.   Condition at discharge: fair  The results of significant diagnostics from this hospitalization (including imaging, microbiology, ancillary and laboratory) are listed below for reference.   Imaging Studies: CT ABDOMEN PELVIS W CONTRAST  Result Date: 11/18/2022 CLINICAL DATA:  Abdominal pain, acute, nonlocalized EXAM: CT ABDOMEN AND PELVIS WITH CONTRAST TECHNIQUE: Multidetector CT imaging of the abdomen and pelvis was performed using the standard protocol following bolus administration of intravenous contrast. RADIATION DOSE REDUCTION: This exam was performed according to the departmental dose-optimization program which includes automated exposure control, adjustment of the mA and/or kV according to patient size and/or use of iterative reconstruction technique. CONTRAST:  OMNIPAQUE IOHEXOL 350 MG/ML SOLN COMPARISON:  11/12/2022 FINDINGS: Lower chest: Mild bibasilar atelectasis.  Stable cardiomegaly. Hepatobiliary: No focal liver abnormality is seen. No gallstones, gallbladder wall thickening, or biliary dilatation. Pancreas: Unremarkable Spleen: Unremarkable Adrenals/Urinary Tract: The adrenal glands are unremarkable. The kidneys are normal on this noncontrast examination. The bladder is largely decompressed. There is perivesicular inflammatory stranding, asymmetrically more severe involving the dome of the bladder with a punctate focus of intramural gas within the bladder wall again identified suggesting changes of a superimposed infectious or inflammatory cystitis. The inflammatory changes overlying the bladder appear improved since remote prior examination of 10/23/2022 where inflammatory change and multiloculated fluid was identified. Stomach/Bowel: Extreme left lateral peritoneal cavity is excluded from view on this examination. The stomach, small bowel, and large bowel are unremarkable. Previously noted fluid-filled small bowel loops suggestive of an  underlying ileus have resolved. No free intraperitoneal gas or fluid. The appendix is not clearly identified and may be absent. Vascular/Lymphatic: No significant vascular findings are present. No enlarged abdominal or pelvic lymph nodes. Reproductive: Inflammatory changes within the inferior pelvis within the vesicouterine space appear stable. Previously noted bilateral pelvic drainage catheters have been removed in the interval. Pelvic organs are unremarkable. No loculated fluid collections are identified within the pelvis. Other: None Musculoskeletal: No acute bone abnormality. No lytic or blastic bone lesion. IMPRESSION: 1. Perivesicular inflammatory stranding, asymmetrically more severe involving the dome of the bladder with a punctate focus of intramural gas within the bladder wall suggesting changes of a superimposed infectious or inflammatory cystitis. The inflammatory changes overlying the bladder appear improved since remote prior examination of 10/23/2022 where inflammatory change and multiloculated fluid was identified. 2. Interval resolution of fluid-filled small bowel loops suggestive of an underlying ileus. 3. Interval removal of bilateral pelvic drainage catheters. No loculated fluid collections are identified within the pelvis. 4. Stable cardiomegaly. Electronically Signed   By: Helyn Numbers M.D.   On: 11/18/2022 20:51   CT ABDOMEN PELVIS W CONTRAST  Result Date: 11/13/2022 CLINICAL DATA:  Follow-up intra-abdominal abscesses status post percutaneous catheter drainage. EXAM: CT ABDOMEN AND PELVIS WITH CONTRAST TECHNIQUE: Multidetector CT imaging of the abdomen and pelvis was performed using the standard protocol following bolus administration of intravenous contrast. RADIATION DOSE REDUCTION: This exam was performed according to the departmental dose-optimization program which includes automated exposure control, adjustment of the mA and/or kV according to patient size and/or use of iterative  reconstruction technique. CONTRAST:  75mL OMNIPAQUE IOHEXOL 350 MG/ML SOLN COMPARISON:  11/01/2022 FINDINGS: Lower Chest: No acute findings. Resolution of small right pleural effusion since previous study. Decreased right basilar atelectasis. Mild left lower lobe atelectasis shows no significant change. Hepatobiliary: No hepatic masses identified. Gallbladder is unremarkable. No evidence of biliary  ductal dilatation. Pancreas:  No mass or inflammatory changes. Spleen: Within normal limits in size and appearance. Adrenals/Urinary Tract: No suspicious masses identified. 2 mm calculus again seen in upper pole of right kidney. No evidence of ureteral calculi or hydronephrosis. Stomach/Bowel: Mild decrease in small bowel dilatation since prior exam, consistent with improving small bowel obstruction. Vascular/Lymphatic: No pathologically enlarged lymph nodes. No acute vascular findings. Reproductive: Normal size uterus. Right lower quadrant percutaneous drainage catheter is seen in appropriate position, and there has been resolution of the right lower quadrant fluid collections since prior study. Left lower quadrant percutaneous drainage catheter remains in place, without evidence of residual fluid collection. There has been resolution of pelvic free fluid since prior study as well. No new abnormal fluid collections or ascites identified. Other:  None. Musculoskeletal:  No suspicious bone lesions identified. IMPRESSION: Resolution of bilateral lower quadrant fluid collections following percutaneous drainage catheter placements. No new or enlarging abscess identified. Mild decrease in small bowel dilatation, consistent with improving small bowel obstruction or ileus. Resolution of small right pleural effusion and right basilar atelectasis. Stable mild left lower lobe atelectasis. Electronically Signed   By: Danae Orleans M.D.   On: 11/13/2022 12:18   DG Abd 1 View  Result Date: 11/09/2022 CLINICAL DATA:  Pain EXAM:  ABDOMEN - 1 VIEW COMPARISON:  11/05/2022. FINDINGS: Mild small bowel dilatation which is a nonspecific finding. Retained oral contrast in colon. Bilateral percutaneous lower abdominal drains. IMPRESSION: Mild nonspecific small bowel dilatation with interval improvement. Electronically Signed   By: Layla Maw M.D.   On: 11/09/2022 08:01   US PELVIC COMPLETE WITH TRANSVAGINAL  Result Date: 11/07/2022 CLINICAL DATA:  Pelvic abscess. EXAM: TRANSABDOMINAL AND TRANSVAGINAL ULTRASOUND OF PELVIS TECHNIQUE: Both transabdominal and transvaginal ultrasound examinations of the pelvis were performed. Transabdominal technique was performed for global imaging of the pelvis including uterus, ovaries, adnexal regions, and pelvic cul-de-sac. It was necessary to proceed with endovaginal exam following the transabdominal exam to visualize the adnexal structures. COMPARISON:  CT pelvis 11/03/2022 FINDINGS: Uterus Measurements: 4.5 x 3.0 x 2.8 cm = volume: 20 mL. No fibroids or other mass visualized. Endometrium Thickness: 4.2 mm.  No focal abnormality visualized. Right ovary Not visualized Left ovary Not visualized Other findings Small amount of free fluid in the pelvis. IMPRESSION: 1. Small amount of free fluid in the pelvis. 2. The ovaries are not visualized. Electronically Signed   By: Annia Belt M.D.   On: 11/07/2022 17:54   DG Abd Portable 1V  Result Date: 11/05/2022 CLINICAL DATA:  [id number].  Small-bowel obstruction. EXAM: PORTABLE ABDOMEN - 1 VIEW COMPARISON:  11/04/2022 at 9:41 a.m. FINDINGS: 5:14 a.m. there are bilateral pigtail drainage catheters in the lower abdominopelvic quadrants. Contrast has partially eliminated from the dilated small bowel with contrast in the ascending colon faintly visible. Small bowel dilatation continues to measure up to 5 cm and is not significantly changed. Colonic gas is still seen through into the rectum with moderate fecal retention in the ascending and transverse colon. There is no  supine evidence of free air.  Lung bases are clear. No pathologic calcifications or other significant findings are seen. IMPRESSION: 1. No significant change in small-bowel dilatation. Contrast has partially eliminated from the dilated small bowel with contrast in the ascending colon faintly visible. 2. Moderate fecal retention in the ascending and transverse colon. Electronically Signed   By: Almira Bar M.D.   On: 11/05/2022 07:45   DG Abd Portable 1V  Result Date: 11/04/2022 CLINICAL  DATA:  Small-bowel obstruction. EXAM: PORTABLE ABDOMEN - 1 VIEW COMPARISON:  Radiographs 11/03/2022 and 11/01/2022.  CT 11/01/2022. FINDINGS: 0938 hours. Four supine views of the abdomen are submitted. There is a new percutaneous drain in the right lower quadrant. Existing percutaneous drain in the left lower quadrant is not significantly changed. Multiple moderately dilated loops of small bowel are again noted. There is some contrast material in the stomach and in the right lower quadrant, the latter probably within the colon. No extravasated contrast material identified. There is some gas in the colon. IMPRESSION: Persistent dilated loops of small bowel which may reflect an ileus or partial small bowel obstruction. New percutaneous drain in the right lower quadrant. No evidence of extravasated contrast material. Electronically Signed   By: Carey Bullocks M.D.   On: 11/04/2022 10:26   CT GUIDED PERITONEAL/RETROPERITONEAL FLUID DRAIN BY PERC CATH  Result Date: 11/03/2022 CLINICAL DATA:  Status post previous percutaneous catheter drainage of left-sided pelvic abscess on 10/25/2022. New intra-abdominal fluid collection in the right lower quadrant now requiring second percutaneous catheter drainage procedure. EXAM: CT GUIDED CATHETER DRAINAGE OF PERITONEAL ABSCESS ANESTHESIA/SEDATION: Moderate (conscious) sedation was employed during this procedure. A total of Versed 2.0 mg and Fentanyl 100 mcg was administered intravenously.  Moderate Sedation Time: 26 minutes. The patient's level of consciousness and vital signs were monitored continuously by radiology nursing throughout the procedure under my direct supervision. PROCEDURE: The procedure, risks, benefits, and alternatives were explained to the patient. Questions regarding the procedure were encouraged and answered. The patient understands and consents to the procedure. A time out was performed prior to initiating the procedure. The right lower abdominal wall was prepped with chlorhexidine in a sterile fashion, and a sterile drape was applied covering the operative field. A sterile gown and sterile gloves were used for the procedure. Local anesthesia was provided with 1% Lidocaine. CT was performed in a supine position through the mid to lower abdomen and pelvis. After choosing a site for skin entry an 18 gauge trocar needle was advanced under CT guidance to the level of a right lower quadrant anterior peritoneal abscess. After confirming needle tip position, fluid was aspirated. A guidewire was advanced into the fluid collection and the needle removed. The tract was dilated over the wire and a 10 French percutaneous drainage catheter placed. Catheter position was confirmed by CT. The drainage catheter was further aspirated and a fluid sample sent for culture analysis. The drain was flushed and connected to a suction bulb. It was secured at the skin with a Prolene retention suture and adhesive retention device. RADIATION DOSE REDUCTION: This exam was performed according to the departmental dose-optimization program which includes automated exposure control, adjustment of the mA and/or kV according to patient size and/or use of iterative reconstruction technique. COMPLICATIONS: None FINDINGS: Fluid collection in the right lower quadrant again localizes lateral to bowel and measures roughly 4 cm in maximum diameter. The fluid collection in the left pelvis is completely decompressed by the  previously placed percutaneous drainage catheter. After advancing a trocar needle into the collection, there was return of turbid, yellow fluid. After drainage catheter placement, a larger fluid sample was withdrawn and sent for culture analysis. After drain placement, there is good return fluid. IMPRESSION: CT-guided percutaneous catheter drainage of right lower quadrant peritoneal abscess. A 10 French drainage catheter was placed and attached to suction bulb drainage. A sample of turbid yellow fluid was sent for culture analysis. Initial CT shows complete decompression of the left  pelvic fluid collection after prior catheter drainage. Electronically Signed   By: Irish Lack M.D.   On: 11/03/2022 14:30   DG Abd Portable 1V-Small Bowel Obstruction Protocol-initial, 8 hr delay  Result Date: 11/03/2022 CLINICAL DATA:  Small-bowel obstruction EXAM: PORTABLE ABDOMEN - 1 VIEW COMPARISON:  CT 11/01/2022 FINDINGS: Oral contrast material appears to abut Mrs. There is diffuse small bowel dilatation, measuring up to 5.5-6.5 cm. Normal caliber colon with gaseous distension. Percutaneous drainage catheter noted in the left hemipelvis. IMPRESSION: Diffuse small bowel dilation compatible with small bowel obstruction. Colon is also distended to a degree, ileus is possible. Oral contrast noted in the stomach. Electronically Signed   By: Caprice Renshaw M.D.   On: 11/03/2022 12:10   CT ABDOMEN PELVIS W CONTRAST  Result Date: 11/01/2022 CLINICAL DATA:  Intra-abdominal abscess, follow-up examination EXAM: CT ABDOMEN AND PELVIS WITH CONTRAST TECHNIQUE: Multidetector CT imaging of the abdomen and pelvis was performed using the standard protocol following bolus administration of intravenous contrast. RADIATION DOSE REDUCTION: This exam was performed according to the departmental dose-optimization program which includes automated exposure control, adjustment of the mA and/or kV according to patient size and/or use of iterative  reconstruction technique. CONTRAST:  OMNIPAQUE IOHEXOL 350 MG/ML SOLN COMPARISON:  10/23/2022 FINDINGS: Lower chest: Small right pleural effusion. Mild bibasilar atelectasis. Cardiac size within normal limits. Hepatobiliary: Vicarious excretion of contrast within the gallbladder which is otherwise unremarkable. Ill-defined hypodensity within the right hepatic lobe adjacent the gallbladder fossa is nonspecific, but is unchanged. No intra or extrahepatic biliary ductal dilation. No enhancing intrahepatic mass. Pancreas: Unremarkable Spleen: Unremarkable Adrenals/Urinary Tract: The adrenal glands are unremarkable. Punctate 2 mm nonobstructing calculus noted within the upper pole the right kidney. The kidneys are otherwise unremarkable. The bladder is largely decompressed. Infiltration is again seen superior to the bladder dome related to the adjacent inflammatory process, unchanged. Stomach/Bowel: Developing mid small bowel obstruction with a single point of transition identified within the mid abdomen at axial image # 54/3 with fluid distension of the proximal small bowel and decompression of the more distal small bowel and colon. No free intraperitoneal gas. Interval percutaneous drainage of the loculated inflammatory appearing fluid collection within the left adnexa with complete evacuation of the previously noted loculated fluid. There is extensive surrounding inflammatory change and poorly loculated fluid seen medial to the drainage catheter, unchanged from prior examination. Several small bowel loops adjacent to this inflammatory process demonstrate mild bowel wall thickening, likely reactive. New loculated rim enhancing fluid collection is seen within the right lower quadrant abutting several loops of a distal small bowel measuring 3.2 x 7.4 x 5.0 cm on coronal image # 89/7 and axial image # 68/3. Stable mild free fluid within the right pericolic gutter and within the cul-de-sac. Vascular/Lymphatic: No  significant vascular findings are present. No enlarged abdominal or pelvic lymph nodes. Reproductive: As noted above, the loculated fluid collection in keeping with a tubo-ovarian abscess within the left adnexa has been completely evacuated following placement of a left lower quadrant percutaneous drainage catheter. Extensive surrounding inflammatory changes and poorly loculated fluid within the right adnexa persist. Other: Moderate subcutaneous body wall edema. No abdominal wall hernia. Musculoskeletal: No acute bone abnormality. No lytic or blastic bone lesion. IMPRESSION: 1. Interval percutaneous drainage of the loculated inflammatory appearing fluid collection within the left adnexa with complete evacuation of the previously noted loculated fluid. Extensive surrounding inflammatory change and poorly loculated fluid within the right adnexa persist. 2. New loculated rim enhancing fluid  collection within the right lower quadrant abutting several loops of a distal small bowel measuring 3.2 x 7.4 x 5.0 cm. 3. Developing mid small bowel obstruction with a single point of transition identified within the mid abdomen. 4. Minimal right nonobstructing nephrolithiasis. 5. Moderate subcutaneous body wall edema. Electronically Signed   By: Helyn Numbers M.D.   On: 11/01/2022 23:20   [place] EKG SITE RITE  Result Date: 11/01/2022 If Site Rite image not attached, placement could not be confirmed due to current cardiac rhythm.  DG Abd 1 View  Result Date: 11/01/2022 CLINICAL DATA:  Ileus.  Nausea. EXAM: ABDOMEN - 1 VIEW COMPARISON:  Abdominal x-ray dated October 29, 2022. FINDINGS: Unchanged left-sided pelvic percutaneous pigtail drainage catheter. Unchanged diffusely dilated air-filled small bowel with loops measuring up to 6.6 cm in diameter. Unchanged dilatation of the transverse colon. No acute osseous abnormality. IMPRESSION: 1. Unchanged ileus. Electronically Signed   By: Obie Dredge M.D.   On: 11/01/2022 09:48   DG  Abd Portable 1V  Result Date: 10/29/2022 CLINICAL DATA:  [id number] Ileus [id number] EXAM: PORTABLE ABDOMEN - 1 VIEW COMPARISON:  10/28/2022 FINDINGS: Stomach is nondistended. Multiple gas dilated small bowel loops in the mid abdomen, relatively stable in number and degree of dilatation. There is gaseous distention of the transverse colon. Rectum decompressed. Left pelvic pigtail catheter remains in place. IMPRESSION: Persistent small bowel dilatation suggesting ileus or partial small bowel obstruction. Electronically Signed   By: Corlis Leak M.D.   On: 10/29/2022 11:24   DG Abd Portable 1V  Result Date: 10/28/2022 CLINICAL DATA:  Ileus EXAM: PORTABLE ABDOMEN - 1 VIEW COMPARISON:  CT 10/23/2022.  X-ray 02/17/2021 FINDINGS: there is a drain overlying the central pelvis. Pigtail catheter. Several mildly dilated air-filled loops of small bowel and large bowel are noted. There is differential. Favor ileus. Recommend continued follow-up. No obvious free air. Images are limited due to motion and underpenetration. IMPRESSION: Multiple dilated air-filled loops of small and large bowel. Small bowel measuring up to 4.9 cm. There is a differential including developing obstruction but this very well could represent an ileus recommend continued follow-up. Pelvic drain. Limited x-rays Electronically Signed   By: Karen Kays M.D.   On: 10/28/2022 11:36   CT GUIDED PERITONEAL/RETROPERITONEAL FLUID DRAIN BY PERC CATH  Result Date: 10/25/2022 CLINICAL DATA:  Left tubo-ovarian abscess EXAM: CT GUIDED DRAINAGE OF /PELVIC ABSCESS ANESTHESIA/SEDATION: Intravenous Fentanyl administered as pain control during continuous monitoring of the patient's level of consciousness and physiological / cardiorespiratory status by the radiology RN, with a total moderate sedation time of 26 minutes. PROCEDURE: The procedure, risks, benefits, and alternatives were explained to the patient. Questions regarding the procedure were encouraged and  answered. The patient understands and consents to the procedure. Select axial scans through the lower abdomen obtained. The left pelvic collection was localized and an appropriate skin entry site determined and marked. The operative field was prepped with chlorhexidinein a sterile fashion, and a sterile drape was applied covering the operative field. A sterile gown and sterile gloves were used for the procedure. Local anesthesia was provided with 1% Lidocaine. Under CT fluoroscopic guidance, 18 gauge trocar needle advanced into the collection. Purulent material could be aspirated. Amplatz wire advanced easily, position confirmed on CT. Tract dilated to facilitate placement of 12 French pigtail catheter, formed centrally within the collection. CT confirmed good positioning. Catheter secured externally with 0 Prolene suture and StatLock. 60 mL purulent material were aspirated, sent for Gram stain and culture.  Catheter was flushed and placed to gravity drain bag. The patient tolerated the procedure well. COMPLICATIONS: None immediate FINDINGS: Left tubo-ovarian fluid collection was localized. 12 French pigtail drain catheter placed as above. 60 mL purulent fluid aspirated, sent for Gram stain and culture. IMPRESSION: 1. Technically successful CT-guided left pelvic abscess drain catheter placement. RADIATION DOSE REDUCTION: This exam was performed according to the departmental dose-optimization program which includes automated exposure control, adjustment of the mA and/or kV according to patient size and/or use of iterative reconstruction technique. Electronically Signed   By: Corlis Leak M.D.   On: 10/25/2022 15:56   ECHOCARDIOGRAM COMPLETE  Result Date: 10/24/2022    ECHOCARDIOGRAM REPORT   Patient Name:   SHANDY CHECO Date of Exam: 10/24/2022 Medical Rec #:  [id number]     Height:       68.0 in Accession #:    [id number]    Weight:       339.5 lb Date of Birth:  [birth year]     BSA:          2.559 m Patient Age:     52 years      BP:           134/71 mmHg Patient Gender: F             HR:           96 bpm. Exam Location:  Inpatient Procedure: 2D Echo, Cardiac Doppler, Color Doppler and Intracardiac            Opacification Agent Indications:    Syncope  History:        Patient has prior history of Echocardiogram examinations, most                 recent 05/21/2021. CHF, Stroke, Signs/Symptoms:Syncope; Risk                 Factors:Hypertension, Diabetes and Dyslipidemia.  Sonographer:    Mikki Harbor Referring Phys: [id number] TIMOTHY S OPYD  Sonographer Comments: Technically difficult study due to poor echo windows and patient is obese. IMPRESSIONS  1. Left ventricular ejection fraction, by estimation, is 55 to 60%. The left ventricle has normal function. Left ventricular endocardial border not optimally defined to evaluate regional wall motion. There is mild concentric left ventricular hypertrophy. Left ventricular diastolic parameters are indeterminate.  2. Right ventricular systolic function is normal. The right ventricular size is normal. There is normal pulmonary artery systolic pressure.  3. The mitral valve was not well visualized. Trivial mitral valve regurgitation. No evidence of mitral stenosis.  4. The aortic valve is tricuspid. There is mild calcification of the aortic valve. Aortic valve regurgitation is not visualized. Aortic valve sclerosis/calcification is present, without any evidence of aortic stenosis. Aortic valve area, by VTI measures  2.08 cm. Aortic valve mean gradient measures 5.0 mmHg. Aortic valve Vmax measures 1.46 m/s.  5. The inferior vena cava is normal in size with greater than 50% respiratory variability, suggesting right atrial pressure of 3 mmHg.  6. Very difficult images due to poor sound wave transmission. LVEF appears normal but endocardium not well seen. FINDINGS  Left Ventricle: Left ventricular ejection fraction, by estimation, is 55 to 60%. The left ventricle has normal function. Left  ventricular endocardial border not optimally defined to evaluate regional wall motion. Definity contrast agent was given IV to delineate the left ventricular endocardial borders. The left ventricular internal cavity size was normal in size. There is mild concentric left ventricular hypertrophy.  Left ventricular diastolic parameters are indeterminate. Right Ventricle: The right ventricular size is normal. No increase in right ventricular wall thickness. Right ventricular systolic function is normal. There is normal pulmonary artery systolic pressure. The tricuspid regurgitant velocity is 2.33 m/s, and  with an assumed right atrial pressure of 8 mmHg, the estimated right ventricular systolic pressure is 29.7 mmHg. Left Atrium: Left atrial size was not well visualized. Right Atrium: Right atrial size was not well visualized. Pericardium: Trivial pericardial effusion is present. The pericardial effusion is anterior to the right ventricle. Mitral Valve: The mitral valve was not well visualized. Trivial mitral valve regurgitation. No evidence of mitral valve stenosis. MV peak gradient, 2.0 mmHg. The mean mitral valve gradient is 1.0 mmHg. Tricuspid Valve: The tricuspid valve is normal in structure. Tricuspid valve regurgitation is trivial. No evidence of tricuspid stenosis. Aortic Valve: The aortic valve is tricuspid. There is mild calcification of the aortic valve. Aortic valve regurgitation is not visualized. Aortic valve sclerosis/calcification is present, without any evidence of aortic stenosis. Aortic valve mean gradient measures 5.0 mmHg. Aortic valve peak gradient measures 8.5 mmHg. Aortic valve area, by VTI measures 2.08 cm. Pulmonic Valve: The pulmonic valve was normal in structure. Pulmonic valve regurgitation is trivial. No evidence of pulmonic stenosis. Aorta: The aortic root is normal in size and structure. Venous: The inferior vena cava is normal in size with greater than 50% respiratory variability,  suggesting right atrial pressure of 3 mmHg. IAS/Shunts: No atrial level shunt detected by color flow Doppler.  LEFT VENTRICLE PLAX 2D LVIDd:         4.90 cm LVIDs:         3.30 cm LV PW:         1.30 cm LV IVS:        1.10 cm LVOT diam:     2.10 cm LV SV:         50 LV SV Index:   20 LVOT Area:     3.46 cm  LEFT ATRIUM         Index LA diam:    3.10 cm 1.21 cm/m  AORTIC VALVE                    PULMONIC VALVE AV Area (Vmax):    1.97 cm     PV Vmax:       0.88 m/s AV Area (Vmean):   1.72 cm     PV Peak grad:  3.1 mmHg AV Area (VTI):     2.08 cm AV Vmax:           146.00 cm/s AV Vmean:          99.100 cm/s AV VTI:            0.241 m AV Peak Grad:      8.5 mmHg AV Mean Grad:      5.0 mmHg LVOT Vmax:         82.90 cm/s LVOT Vmean:        49.200 cm/s LVOT VTI:          0.145 m LVOT/AV VTI ratio: 0.60  AORTA Ao Root diam: 3.30 cm MITRAL VALVE               TRICUSPID VALVE MV Area (PHT): 4.12 cm    TR Peak grad:   21.7 mmHg MV Area VTI:   3.12 cm    TR Vmax:        233.00 cm/s  MV Peak grad:  2.0 mmHg MV Mean grad:  1.0 mmHg    SHUNTS MV Vmax:       0.70 m/s    Systemic VTI:  0.14 m MV Vmean:      45.6 cm/s   Systemic Diam: 2.10 cm MV Decel Time: 184 msec MV E velocity: 57.00 cm/s MV A velocity: 62.10 cm/s MV E/A ratio:  0.92 Arvilla Meres MD Electronically signed by Arvilla Meres MD Signature Date/Time: 10/24/2022/4:34:36 PM    Final    EEG adult  Result Date: 10/24/2022 Charlsie Quest, MD     10/24/2022 10:12 AM Patient Name: TYMESHIA AWAN MRN: [id number] Epilepsy Attending: Charlsie Quest Referring Physician/Provider: Briscoe Deutscher, MD Date: 10/24/2022 Duration: 25.25 mins Patient history: 52 year old female with syncope in the setting of pelvic infection. EEG to evaluate for seizure Level of alertness: lethargic AEDs during EEG study: PGB, TPM Technical aspects: This EEG study was done with scalp electrodes positioned according to the 10-20 International system of electrode placement. Electrical  activity was reviewed with band pass filter of 1-70Hz , sensitivity of 7 uV/mm, display speed of 34mm/sec with a 60Hz  notched filter applied as appropriate. EEG data were recorded continuously and digitally stored.  Video monitoring was available and reviewed as appropriate. Description: The posterior dominant rhythm consists of 8 Hz activity of moderate voltage (25-35 uV) seen predominantly in posterior head regions, symmetric and reactive to eye opening and eye closing. EEG showed intermittent generalized 3 to 6 Hz theta-delta slowing. Physiologic photic driving was not seen during photic stimulation.  Hyperventilation was not performed.   ABNORMALITY - Intermittent slow, generalized IMPRESSION: This study is suggestive of mild diffuse encephalopathy, nonspecific etiology. No seizures or epileptiform discharges were seen throughout the recording. Please note lack of epileptiform activity during interictal EEG does not exclude the diagnosis of epilepsy. Charlsie Quest   US Pelvis Complete  Result Date: 10/24/2022 CLINICAL DATA:  Infection and pain. EXAM: TRANSABDOMINAL ULTRASOUND OF PELVIS TECHNIQUE: Transabdominal ultrasound examination of the pelvis was performed including evaluation of the uterus, ovaries, adnexal regions, and pelvic cul-de-sac. COMPARISON:  CT 10/23/2022 FINDINGS: Uterus Uterus is not visualized. Endometrium Not visualized. Right ovary Not visualized. Left ovary Not visualized. Other findings: Examination is technically limited due to patient's body habitus and incomplete filling of the bladder as well as bowel gas. IMPRESSION: Technically limited examination. Uterus, ovaries, and adnexal structures are not visualized. Electronically Signed   By: Burman Nieves M.D.   On: 10/24/2022 01:02   CT ABDOMEN PELVIS W CONTRAST  Result Date: 10/23/2022 CLINICAL DATA:  Abdominal pain. EXAM: CT ABDOMEN AND PELVIS WITH CONTRAST TECHNIQUE: Multidetector CT imaging of the abdomen and pelvis was  performed using the standard protocol following bolus administration of intravenous contrast. RADIATION DOSE REDUCTION: This exam was performed according to the departmental dose-optimization program which includes automated exposure control, adjustment of the mA and/or kV according to patient size and/or use of iterative reconstruction technique. CONTRAST:  75mL OMNIPAQUE IOHEXOL 350 MG/ML SOLN COMPARISON:  CT abdomen pelvis dated 10/14/2022. FINDINGS: Lower chest: Minimal bibasilar atelectasis. The visualized lung bases are otherwise clear. No intra-abdominal free air.  Trace free fluid in the pelvis. Hepatobiliary: The liver is unremarkable. No biliary dilatation. The gallbladder is unremarkable Pancreas: Unremarkable. No pancreatic ductal dilatation or surrounding inflammatory changes. Spleen: Normal in size without focal abnormality. Adrenals/Urinary Tract: The adrenal glands are unremarkable there is no hydronephrosis on either side. There is symmetric excretion of contrast by both kidneys.  The visualized ureters appear unremarkable. The urinary bladder is collapsed. Stomach/Bowel: There is a small hiatal hernia. There is no bowel obstruction or active inflammation. The appendix is normal. Vascular/Lymphatic: The abdominal aorta and IVC are unremarkable. No portal venous gas. There is no adenopathy. Reproductive: The uterus appears anteverted with poorly visualized. There is diffuse inflammatory changes and stranding of the pelvic fat, new since the prior CT. There is a partially organized low attenuating collection in the left hemipelvis measuring approximately 5 x 5 cm in greatest axial dimensions concerning for developing abscess. Small amount of high density fluid may represent complex fluid or blood product. Findings may represent pelvic inflammatory disease with developing abscesses. Clinical correlation and further evaluation with pelvic ultrasound recommended. Other: None Musculoskeletal: No acute or  significant osseous findings. IMPRESSION: 1. Diffuse inflammatory changes and stranding of the pelvic fat, new since the prior CT. Findings may represent an inflammatory/infectious process such as PID with developing abscess. A small amount of hemorrhagic content may also be present in the pelvis and therefore the possibility of a ruptured hemorrhagic cyst, or trauma is not excluded. No drainable fluid collection/abscess at this time. Clinical correlation and further evaluation with pelvic ultrasound recommended. 2. No bowel obstruction. Normal appendix. Electronically Signed   By: Elgie Collard M.D.   On: 10/23/2022 22:08   CT ANGIO HEAD NECK W WO CM (CODE STROKE)  Result Date: 10/23/2022 CLINICAL DATA:  Initial evaluation for stroke. EXAM: CT ANGIOGRAPHY HEAD AND NECK TECHNIQUE: Multidetector CT imaging of the head and neck was performed using the standard protocol during bolus administration of intravenous contrast. Multiplanar CT image reconstructions and MIPs were obtained to evaluate the vascular anatomy. Carotid stenosis measurements (when applicable) are obtained utilizing NASCET criteria, using the distal internal carotid diameter as the denominator. RADIATION DOSE REDUCTION: This exam was performed according to the departmental dose-optimization program which includes automated exposure control, adjustment of the mA and/or kV according to patient size and/or use of iterative reconstruction technique. CONTRAST:  75mL OMNIPAQUE IOHEXOL 350 MG/ML SOLN COMPARISON:  CT from earlier the same day as well as prior exam from 02/18/2021. FINDINGS: CTA NECK FINDINGS Aortic arch: Visualized aortic arch normal caliber with standard branch pattern. No stenosis about the origin the great vessels. Right carotid system: Right common and internal carotid arteries widely patent without stenosis, dissection or occlusion. Left carotid system: Left common and internal carotid arteries widely patent without stenosis,  dissection or occlusion. Vertebral arteries: Both vertebral arteries arise from subclavian arteries. No proximal subclavian artery stenosis. Vertebral arteries are markedly hypoplastic but patent without stenosis or dissection. Skeleton: No discrete or worrisome osseous lesions. Moderate spondylosis present at C4-5 through C6-7. Other neck: No other acute soft tissue abnormality within the neck. Few punctate sialoliths noted within the left parotid gland. Upper chest: Visualized upper chest demonstrates no acute finding. Review of the MIP images confirms the above findings CTA HEAD FINDINGS Anterior circulation: Both internal carotid arteries are patent to the termini without stenosis. Persistent trigeminal artery noted on the right. A1 segments, anterior communicating complex common anterior cerebral arteries patent without stenosis. No M1 stenosis or occlusion. No proximal MCA branch occlusion or high-grade stenosis. Distal MCA branches perfused and symmetric. Posterior circulation: Both V4 segments patent without stenosis. Both PICA patent. Proximal basilar artery is diminutive but patent. Basilar more normal in caliber distal to the persistent right trigeminal artery. Superior cerebellar and posterior cerebral arteries patent bilaterally. Venous sinuses: Patent allowing for timing the contrast bolus. Anatomic  variants: Persistent trigeminal artery with diminutive vertebrobasilar system. Review of the MIP images confirms the above findings IMPRESSION: 1. Negative CTA of the head and neck. No large vessel occlusion or other emergent finding. 2. 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 These results were communicated to Dr. Amada Jupiter at 9:59 pm on 10/23/2022 by text page via the [facility] messaging system. Electronically Signed   By: Rise Mu M.D.   On: 10/23/2022 22:02   CT HEAD CODE STROKE WO CONTRAST  Result Date: 10/23/2022 CLINICAL DATA:  Code  stroke. EXAM: CT HEAD WITHOUT CONTRAST TECHNIQUE: Contiguous axial images were obtained from the base of the skull through the vertex without intravenous contrast. RADIATION DOSE REDUCTION: This exam was performed according to the departmental dose-optimization program which includes automated exposure control, adjustment of the mA and/or kV according to patient size and/or use of iterative reconstruction technique. COMPARISON:  None Available. FINDINGS: Brain: Cerebral volume within normal limits. No acute intracranial hemorrhage. No acute large vessel territory infarct. No mass lesion, midline shift or mass effect. No hydrocephalus or extra-axial fluid collection. Vascular: No abnormal hyperdense vessel. Skull: Scalp soft tissues and calvarium within normal limits. Sinuses/Orbits: Left a spur [place] noted. Few small retention cyst noted within the left maxillary sinus. Mild mucosal thickening noted about the ethmoidal air cells. No mastoid effusion. Other: None. ASPECTS [facility] Stroke Program Early CT Score) - Ganglionic level infarction (caudate, lentiform nuclei, internal capsule, insula, M1-M3 cortex): 7 - Supraganglionic infarction (M4-M6 cortex): 3 Total score (0-10 with 10 being normal): 10 IMPRESSION: 1. No acute intracranial abnormality. 2. ASPECTS is 10. These results were communicated to Dr. Amada Jupiter at 9:17 pm on 10/23/2022 by text page via the [facility] messaging system. Electronically Signed   By: Rise Mu M.D.   On: 10/23/2022 21:18    Microbiology: Results for orders placed or performed during the hospital encounter of 10/23/22  Wet prep, genital     Status: None   Collection Time: 10/23/22 11:20 PM  Result Value Ref Range Status   Yeast Wet Prep HPF POC NONE SEEN NONE SEEN Final   Trich, Wet Prep NONE SEEN NONE SEEN Final    Comment: Specimen diluted due to transport tube containing more than 1 ml of saline, interpret results with caution.   Clue Cells Wet Prep HPF POC NONE  SEEN NONE SEEN Final   WBC, Wet Prep HPF POC <10 <10 Final   Sperm NONE SEEN  Final    Comment: Performed at [facility], [address] [place], [place] [address]  Aerobic/Anaerobic Culture w Gram Stain (surgical/deep wound)     Status: None   Collection Time: 10/25/22  1:40 PM   Specimen: Abscess  Result Value Ref Range Status   Specimen Description ABSCESS  Final   Special Requests NONE  Final   Gram Stain   Final    ABUNDANT WBC PRESENT, PREDOMINANTLY PMN ABUNDANT GRAM NEGATIVE RODS ABUNDANT GRAM POSITIVE COCCI IN PAIRS IN CHAINS    Culture   Final    ABUNDANT STREPTOCOCCUS ANGINOSIS GROUP B STREP(S.AGALACTIAE)ISOLATED TESTING AGAINST S. AGALACTIAE NOT ROUTINELY PERFORMED DUE TO PREDICTABILITY OF AMP/PEN/VAN SUSCEPTIBILITY. ABUNDANT PREVOTELLA BIVIA BETA LACTAMASE POSITIVE Performed at [facility], [address] [place], [place] [address]    Report Status 10/29/2022 FINAL  Final   Organism ID, Bacteria STREPTOCOCCUS ANGINOSIS  Final      Susceptibility   Streptococcus anginosis - MIC*    PENICILLIN <=0.06 SENSITIVE Sensitive  CEFTRIAXONE <=0.12 SENSITIVE Sensitive     ERYTHROMYCIN 2 RESISTANT Resistant     LEVOFLOXACIN 0.5 SENSITIVE Sensitive     VANCOMYCIN 0.5 SENSITIVE Sensitive     * ABUNDANT STREPTOCOCCUS ANGINOSIS  Culture, blood (Routine X 2) w Reflex to ID Panel     Status: None   Collection Time: 10/25/22  2:47 PM   Specimen: BLOOD RIGHT ARM  Result Value Ref Range Status   Specimen Description BLOOD RIGHT ARM  Final   Special Requests   Final    BOTTLES DRAWN AEROBIC AND ANAEROBIC Blood Culture adequate volume   Culture   Final    NO GROWTH 5 DAYS Performed at [facility], [address], [place], [place] [address]    Report Status 10/30/2022 FINAL  Final  Culture, blood (Routine X 2) w Reflex to ID Panel     Status: None   Collection Time: 10/25/22  2:47 PM   Specimen: BLOOD RIGHT ARM  Result Value Ref Range Status   Specimen  Description BLOOD RIGHT ARM  Final   Special Requests   Final    BOTTLES DRAWN AEROBIC AND ANAEROBIC Blood Culture results may not be optimal due to an inadequate volume of blood received in culture bottles   Culture   Final    NO GROWTH 5 DAYS Performed at [facility], [address] [place], [place] [address]    Report Status 10/30/2022 FINAL  Final  Urine Culture     Status: Abnormal   Collection Time: 10/30/22  3:00 AM   Specimen: Urine, Random  Result Value Ref Range Status   Specimen Description URINE, RANDOM  Final   Special Requests NONE Reflexed from [id number]  Final   Culture (A)  Final    <10,000 COLONIES/mL INSIGNIFICANT GROWTH Performed at [facility], [address], [place], [place] [address]    Report Status 10/31/2022 FINAL  Final  Aerobic/Anaerobic Culture w Gram Stain (surgical/deep wound)     Status: None   Collection Time: 11/03/22  1:08 PM   Specimen: Abscess  Result Value Ref Range Status   Specimen Description ABSCESS  Final   Special Requests Normal  Final   Gram Stain   Final    ABUNDANT WBC PRESENT, PREDOMINANTLY PMN NO ORGANISMS SEEN    Culture   Final    No growth aerobically or anaerobically. Performed at [facility], [address], [place], [place] [address]    Report Status 11/08/2022 FINAL  Final    Labs: CBC: Recent Labs  Lab 11/14/22 0615 11/15/22 1516 11/15/22 1733 11/17/22 1423  WBC 8.8  --   --  8.2  HGB 7.9* 8.7* 9.9* 9.7*  HCT 24.8* 27.2* 31.5* 29.4*  MCV 89.5  --   --  88.0  PLT 216  --   --  203   Basic Metabolic Panel: Recent Labs  Lab 11/13/22 0534 11/14/22 0615 11/15/22 1733 11/17/22 1423  NA 136 139 135 138  K 4.0 3.8 4.4 3.5  CL 103 105 104 98  CO2 25 26 22 28   GLUCOSE 166* 154* 135* 166*  BUN 12 10 12 11   CREATININE 0.64 0.70 0.82 0.86  CALCIUM 8.3* 8.3* 8.5* 8.8*  MG 1.6* 1.7 1.8 1.7  PHOS 4.5 4.3 4.1 4.6   Liver Function Tests: Recent Labs  Lab 11/14/22 0615 11/17/22 1423   AST 14* 17  ALT 14 14  ALKPHOS 76 91  BILITOT 0.3 0.5  PROT 5.5* 6.8  ALBUMIN 2.0* 2.6*   CBG: Recent Labs  Lab 11/18/22 1204 11/18/22 1553 11/18/22 2046 11/19/22 0740 11/19/22 1154  GLUCAP 150* 202* 123* 126* 168*    Discharge time spent: approximately 45 minutes spent on discharge counseling, evaluation of patient on day of discharge, and coordination of discharge planning with nursing, social work, pharmacy and case management  Signed: Alberteen Sam, MD [facility] 11/19/2022

## 2022-11-19 NOTE — Progress Notes (Signed)
Notified by hospitalist, Dr. Maryfrances Bunnell, of new CT AP with perivesicular inflammatory stranding most severe at the dome of the bladder with punctate focus of intramural gas within the bladder wall suggesting changes of a superimposed infectious or inflammatory cystitis, overall inflammatory changes of the bladder appear improved since prior scan on 10/23/22. Patient seen by our service previously for intra-abdominal abscess/TOA that was managed with IR drains and abx. IR drains are now removed and abscesses appear resolved on most recent CT. Hospitalization has also been complicated by ileus which is resolved. CT findings were discussed with urology yesterday who expressed concern for colovesical fistula based on CT. Per primary attending patient has generalized abdominal pain but no specific urinary complaints. UA pending. Would not recommend any acute surgical intervention for colovesical fistula particularly if patient is not symptomatic. Consider CT with rectal contrast since we have not confirmed fistula with imaging. Would treat any UTIs with antibiotics. Recommend fiber supplementation to bulk stool and ensure regular bowel function. Follow up with general surgery as an outpatient PRN if fistula confirmed and patient desires to discuss elective takedown of colovesical fistula.   Juliet Rude, [facility] 11/19/2022, 11:15 AM Please see Amion for pager number during day hours 7:00am-4:30pm

## 2022-11-19 NOTE — Progress Notes (Signed)
    Durable Medical Equipment  (From admission, onward)           Start     Ordered   11/19/22 1237  For home use only DME Bedside commode  Once       Comments: Bariatric 360 pounds  Patient confined to room without bathroom  Question:  Patient needs a bedside commode to treat with the following condition  Answer:  Weakness   11/19/22 1238           Patient confined to room without bathroom, and unable to ambulate to restroom.

## 2022-11-21 LAB — GLUCOSE, CAPILLARY
Glucose-Capillary: 145 mg/dL — ABNORMAL HIGH (ref 70–99)
Glucose-Capillary: 154 mg/dL — ABNORMAL HIGH (ref 70–99)
Glucose-Capillary: 174 mg/dL — ABNORMAL HIGH (ref 70–99)

## 2022-11-30 ENCOUNTER — Encounter: Payer: Self-pay | Admitting: Physician Assistant

## 2022-11-30 ENCOUNTER — Other Ambulatory Visit (HOSPITAL_COMMUNITY): Payer: Self-pay

## 2023-01-02 ENCOUNTER — Ambulatory Visit: Payer: Medicaid Other | Admitting: Podiatry

## 2023-01-03 ENCOUNTER — Ambulatory Visit: Payer: Medicaid Other | Admitting: Podiatry

## 2023-02-21 ENCOUNTER — Other Ambulatory Visit: Payer: Self-pay | Admitting: Endocrinology

## 2023-02-21 DIAGNOSIS — E1143 Type 2 diabetes mellitus with diabetic autonomic (poly)neuropathy: Secondary | ICD-10-CM

## 2023-02-22 ENCOUNTER — Other Ambulatory Visit: Payer: Self-pay

## 2023-02-22 DIAGNOSIS — E1143 Type 2 diabetes mellitus with diabetic autonomic (poly)neuropathy: Secondary | ICD-10-CM

## 2023-02-22 MED ORDER — ACCU-CHEK GUIDE VI STRP
ORAL_STRIP | 0 refills | Status: DC
Start: 2023-02-22 — End: 2024-02-15

## 2023-03-06 ENCOUNTER — Other Ambulatory Visit: Payer: Self-pay

## 2023-03-06 MED ORDER — TORSEMIDE 20 MG PO TABS: 20.0000 mg | ORAL_TABLET | Freq: Every day | ORAL | 3 refills | Status: DC

## 2023-03-06 MED ORDER — ISOSORB DINITRATE-HYDRALAZINE 20-37.5 MG PO TABS: 1.0000 | ORAL_TABLET | Freq: Three times a day (TID) | ORAL | 3 refills | Status: DC

## 2023-03-22 ENCOUNTER — Telehealth: Payer: Medicaid Other | Admitting: Cardiology

## 2023-03-22 ENCOUNTER — Encounter: Payer: Self-pay | Admitting: Cardiology

## 2023-03-22 DIAGNOSIS — I5032 Chronic diastolic (congestive) heart failure: Secondary | ICD-10-CM

## 2023-03-22 DIAGNOSIS — D508 Other iron deficiency anemias: Secondary | ICD-10-CM

## 2023-03-22 DIAGNOSIS — Z86711 Personal history of pulmonary embolism: Secondary | ICD-10-CM

## 2023-03-22 DIAGNOSIS — I10 Essential (primary) hypertension: Secondary | ICD-10-CM

## 2023-03-22 NOTE — Patient Instructions (Signed)
Please go to [facility] to get blood work done or to any [facility] to get blood work done, you also have lab work from Dr. Leonides Schanz that needs to be done as well.  I will set you up to see me back in the office in 6 months.

## 2023-03-22 NOTE — Progress Notes (Signed)
Virtual Visit via Video Note:  This format is felt to be most appropriate for this patient at this time.  All issues noted in this document were discussed and addressed.  No physical exam was performed  The patient has consented to conduct a Telehealth visit and understands insurance will be billed.   I connected with .mame [name]  On 03/22/23 at  by a video assisted telemedicine application and verified that I am speaking with the correct person using two identifiers.   I discussed the limitations of evaluation and management by telemedicine and the availability of in person appointments. The patient expressed understanding and agreed to proceed.   I have discussed with patient regarding the safety during COVID Pandemic and steps and precautions to be taken including social distancing, frequent hand wash and use of detergent soap, gels with the patient. I asked the patient to avoid touching mouth, nose, eyes, ears with the hands. I encouraged regular walking around the neighborhood and exercise and regular diet, as long as social distancing can be maintained.     Primary Physician:  Verlon Au, MD   Patient ID: [name], female    DOB: [name], 52 y.o.   MRN: [id number]  CC:  No chief complaint on file.  Subjective:   HPI: [name] is a 52 y.o.female  with Sjogren's syndrome, SLE, nonischemic cardiomyopathy, severe LVEF in 2018, and again in 2021, with intermittent improvement in LVEF by echo, last echocardiogram in February 2023 revealing preserved LVEF,  morbid obesity, obesity hypoventilation syndrome, uncontrolled type 2 DM, hypertension, h/o CVA in 2014, OSA on CPAP and hepatic  adenoma s/p embolization on 08/12/19.  She also developed bilateral pulmonary emboli within the proximal segment pulmonary arterioles with severe clot burden and evidence of RV strain consistent with submassive PE in September 2022 when she was admitted with pneumonia and is on chronic  anticoagulation with Eliquis.  She presents for 15-month office visit, she also wanted to discuss with me regarding her recent hospitalization.   States that dyspnea has remained stable, she has not had any significant leg edema except occasionally.  Orthopnea is remained stable.  Recently admitted multiple times to the hospital with abdominal discomfort and there was question about vesicoenteric fistula fortunately this was negative.  States that her lupus medications have recently been changed.  Since then she has noticed mild worsening arthritis but overall states that she has started to feel better.  Past Medical History:  Diagnosis Date   Acute renal failure (HCC)     Intractable nausea vomiting secondary to diabetic gastroparesis causing dehydration and acute renal failure /notes 04/01/2013   Anginal pain (HCC)    Anxiety    Bell's palsy 08/09/2010   Bicornuate uterus    CAP (community acquired pneumonia) 10/2011   Hattie Perch 11/08/2011   Chest pain 01/13/2014   CHF (congestive heart failure) (HCC)    Diabetic gastroparesis associated with type 2 diabetes mellitus (HCC)    this is presumed diagnoses, not confirmed by any studies.    Eczema    Family history of breast cancer    Family history of malignant neoplasm of breast    Family history of malignant neoplasm of ovary    Family history of ovarian cancer    Family history of prostate cancer    GERD (gastroesophageal reflux disease)    High cholesterol    Hypertension    Liver lesion 08/13/2019   Liver mass 2014   biopsied 03/2013 at  [facility] hospital, not malignant.  is to undergo radiologic ablation of the mass in sept/October 2014.    Lupus (HCC)    Migraines    "maybe a couple times/yr" (04/01/2013)   Obesity    Obstructive sleep apnea on CPAP 2011   Oxygen at nights   Pulmonary embolism (HCC) 05/21/2021   SJOGREN'S SYNDROME 08/09/2010   SLE (systemic lupus erythematosus) (HCC)    Hattie Perch 12/01/2010    Social History   Tobacco  Use   Smoking status: Former    Current packs/day: 0.00    Average packs/day: 0.5 packs/day for 3.0 years (1.5 ttl pk-yrs)    Types: Cigarettes    Start date: 06/01/1988    Quit date: 06/02/1991    Years since quitting: 31.8   Smokeless tobacco: Never   Tobacco comments:    quit 28 yrs ago  Substance Use Topics   Alcohol use: No    Alcohol/week: 0.0 standard drinks of alcohol   Marital Status: Single   Review of Systems  Cardiovascular:  Positive for dyspnea on exertion. Negative for chest pain, leg swelling, orthopnea, palpitations and paroxysmal nocturnal dyspnea.  Musculoskeletal:  Positive for arthritis and back pain.  Gastrointestinal:  Negative for melena.   Objective:  Last menstrual period 03/24/2017. There is no height or weight on file to calculate BMI.      11/19/2022   11:52 AM 11/19/2022    7:36 AM 11/19/2022    5:00 AM  Vitals with BMI  Weight   360 lbs 14 oz  BMI   54.89  Systolic 132 128   Diastolic 66 79   Pulse 96 87    Vitals not recorded today as patient does not have any recording devices.  Physical exam not performed or limited due to virtual visit.  Patient appeared to be in no distress, Neck was supple, respiration was not labored.  Please see exam details from prior visit is as below.   Physical Exam Constitutional:      Appearance: She is well-developed.     Comments: Morbidly obese  Neck:     Vascular: No carotid bruit or JVD.     Comments: Short neck and difficult to evaluate JVP Cardiovascular:     Rate and Rhythm: Normal rate and regular rhythm.     Pulses: Intact distal pulses.          Carotid pulses are 2+ on the right side and 2+ on the left side.    Heart sounds: Normal heart sounds. No murmur heard.    No gallop.  Pulmonary:     Effort: Pulmonary effort is normal.     Breath sounds: Normal breath sounds.  Abdominal:     General: Bowel sounds are normal.     Palpations: Abdomen is soft.     Comments: Obese. Pannus present   Musculoskeletal:     Right lower leg: No edema.     Left lower leg: No edema.    Laboratory examination:      Latest Ref Rng & Units 11/17/2022    2:23 PM 11/15/2022    5:33 PM 11/14/2022    6:15 AM  CMP  Glucose 70 - 99 mg/dL 604  540  981   BUN 6 - 20 mg/dL 11  12  10    Creatinine 0.44 - 1.00 mg/dL 1.91  4.78  2.95   Sodium 135 - 145 mmol/L 138  135  139   Potassium 3.5 - 5.1 mmol/L 3.5  4.4  3.8   Chloride 98 - 111 mmol/L 98  104  105   CO2 22 - 32 mmol/L 28  22  26    Calcium 8.9 - 10.3 mg/dL 8.8  8.5  8.3   Total Protein 6.5 - 8.1 g/dL 6.8   5.5   Total Bilirubin 0.3 - 1.2 mg/dL 0.5   0.3   Alkaline Phos 38 - 126 U/L 91   76   AST 15 - 41 U/L 17   14   ALT 0 - 44 U/L 14   14       Latest Ref Rng & Units 11/17/2022    2:23 PM 11/15/2022    5:33 PM 11/15/2022    3:16 PM  CBC  WBC 4.0 - 10.5 K/uL 8.2     Hemoglobin 12.0 - 15.0 g/dL 9.7  9.9  8.7   Hematocrit 36.0 - 46.0 % 29.4  31.5  27.2   Platelets 150 - 400 K/uL 203      Lipid Panel     Component Value Date/Time   CHOL 93 02/21/2022 1104   TRIG 83 11/17/2022 1423   HDL 37.00 (L) 02/21/2022 1104   CHOLHDL 3 02/21/2022 1104   VLDL 11.0 02/21/2022 1104   LDLCALC 45 02/21/2022 1104     HEMOGLOBIN A1C Lab Results  Component Value Date   HGBA1C 7.4 (H) 11/10/2022   MPG 165.68 11/10/2022   TSH Recent Labs    10/17/22 0412 10/24/22 0103  TSH 3.163 1.280     BNP (last 3 results) Recent Labs    10/14/22 0004 10/27/22 0724  BNP 102.4* 81.8     External labs:   Labs 08/02/2022:  Total cholesterol 105, triglycerides 79, HDL 43, LDL 46.  Hb 10.9/HCT 35.2, platelets 272.  CBC stable since December 2021.  Sodium 141, potassium 3.6, BUN 11, creatinine 0.99, EGFR 69 mL, LFTs normal.  TSH normal at 1.98.  Vitamin D is severely reduced at 9.    Radiology:     Cardiac Studies:   Coronary angiogram 02/07/2012: Normal coronary arteries and LVEF. Left dominant circulation.  Echocardiogram 09/09/2021:   1. Left ventricular ejection fraction, by estimation, is 55 to 60%. The left ventricle has normal function. Left ventricular endocardial border not optimally defined to evaluate regional wall motion. There is mild concentric left ventricular  hypertrophy. Left ventricular diastolic parameters are indeterminate.  2. Right ventricular systolic function is normal. The right ventricular size is normal. There is normal pulmonary artery systolic pressure.  3. The mitral valve was not well visualized. Trivial mitral valve regurgitation. No evidence of mitral stenosis.  4. The aortic valve is tricuspid. There is mild calcification of the aortic valve. Aortic valve regurgitation is not visualized. Aortic valve sclerosis/calcification is present, without any evidence of aortic stenosis. Aortic valve area, by VTI measures  2.08 cm. Aortic valve mean gradient measures 5.0 mmHg. Aortic valve Vmax measures 1.46 m/s.  5. The inferior vena cava is normal in size with greater than 50% respiratory variability, suggesting right atrial pressure of 3 mmHg.  6. Very difficult images due to poor sound wave transmission. LVEF appears normal but endocardium not well seen. No significant change from 09/12/2021.  EKG   EKG 09/21/2022: Normal sinus rhythm at rate of 98 bpm, normal axis, nonspecific T wave flattening.  Low-voltage complexes, consider pulmonary disease pattern.  Compared to 03/21/2022, no change.  Medications and allergies   Allergies  Allergen Reactions   Metoclopramide Other (See Comments)  Developed restless leg, akathisia type limb movements.    Codeine Itching and Rash   Hydrocodone Rash   Percocet [Oxycodone-Acetaminophen] Hives and Rash    Tolerates dilaudid Tolerates acetaminophen     Current Outpatient Medications:    ADVAIR HFA 230-21 MCG/ACT inhaler, Inhale 2 puffs into the lungs 2 (two) times daily., Disp: , Rfl:    albuterol (PROVENTIL) (2.5 MG/3ML) 0.083% nebulizer solution, Take 2.5 mg  by nebulization as needed for wheezing., Disp: , Rfl:    ALPRAZolam (XANAX) 0.5 MG tablet, Take 0.5 mg by mouth as needed for anxiety or sleep., Disp: , Rfl:    apixaban (ELIQUIS) 2.5 MG TABS tablet, Take 1 tablet (2.5 mg total) by mouth 2 (two) times daily., Disp: 180 tablet, Rfl: 3   ARIPiprazole (ABILIFY) 5 MG tablet, , Disp: , Rfl:    B-D UF III MINI PEN NEEDLES 31G X 5 MM MISC, USE AS DIRECTED TWICE A DAY, Disp: 100 each, Rfl: 2   buPROPion (WELLBUTRIN XL) 300 MG 24 hr tablet, Take 300 mg by mouth daily., Disp: , Rfl:    carvedilol (COREG) 25 MG tablet, Take 1.5 tablets (37.5 mg total) by mouth 2 (two) times daily with a meal., Disp: 270 tablet, Rfl: 3   Continuous Blood Gluc Sensor (FREESTYLE LIBRE 3 SENSOR) MISC, 1 Device by Does not apply route every 14 (fourteen) days. Apply 1 sensor on upper arm every 14 days for continuous glucose monitoring, Disp: 2 each, Rfl: 2   cromolyn (OPTICROM) 4 % ophthalmic solution, Place 1 drop into both eyes 2 (two) times daily., Disp: , Rfl:    cycloSPORINE (RESTASIS) 0.05 % ophthalmic emulsion, Place 1 drop into both eyes in the morning, at noon, and at bedtime. , Disp: , Rfl:    diclofenac Sodium (VOLTAREN) 1 % GEL, APPLY 2 GRAMS TO AFFECTED AREA 4 TIMES A DAY (Patient taking differently: Apply 2 g topically 4 (four) times daily as needed (pain).), Disp: 400 g, Rfl: 1   EASY TOUCH PEN NEEDLES 32G X 4 MM MISC, USE TO INJECT INSULIN TWICE DAILY, Disp: 100 each, Rfl: 9   famotidine (PEPCID) 20 MG tablet, Take 20 mg by mouth 2 (two) times daily., Disp: , Rfl:    fluticasone (FLONASE) 50 MCG/ACT nasal spray, Place 2 sprays into both nostrils continuous as needed for allergies or rhinitis., Disp: , Rfl:    glucose blood (ACCU-CHEK GUIDE) test strip, Use as instructed, Disp: 100 strip, Rfl: 0   glucose blood (ONETOUCH ULTRA) test strip, Test blood sugar 4 times a day, Disp: 100 each, Rfl: 12   hydroxychloroquine (PLAQUENIL) 200 MG tablet, Take 200 mg by mouth 2  (two) times daily., Disp: , Rfl:    ibuprofen (ADVIL) 600 MG tablet, Take 1 tablet (600 mg total) by mouth every 6 (six) hours as needed. (Patient taking differently: Take 600 mg by mouth every 6 (six) hours as needed for mild pain.), Disp: 30 tablet, Rfl: 0   insulin regular human CONCENTRATED (HUMULIN R U-500 KWIKPEN) 500 UNIT/ML KwikPen, 100 UNITS, 30 MINUTES BEFORE EACH MEAL, Disp: 6 mL, Rfl: 0   Insulin Syringe-Needle U-100 (EASY TOUCH INSULIN SYRINGE) 31G X 5/16" 1 ML MISC, USE AS DIRECTED THREE TIMES A DAY, Disp: 100 each, Rfl: 10   isosorbide-hydrALAZINE (BIDIL) 20-37.5 MG tablet, Take 1 tablet by mouth 3 (three) times daily., Disp: 160 tablet, Rfl: 3   Lancets (ONETOUCH ULTRASOFT) lancets, Test blood sugar 4 times a day, Disp: 100 each, Rfl: 12  levothyroxine (SYNTHROID) 50 MCG tablet, Take 1 tablet (50 mcg total) by mouth daily at 6 (six) AM., Disp:  , Rfl:    megestrol (MEGACE) 40 MG tablet, Take 40 mg by mouth daily., Disp: , Rfl: 3   metFORMIN (GLUCOPHAGE-XR) 500 MG 24 hr tablet, Take 2 tablets (1,000 mg total) by mouth 2 (two) times daily., Disp: 180 tablet, Rfl: 3   Multiple Vitamin (MULTIVITAMIN WITH MINERALS) TABS tablet, Take 1 tablet by mouth daily., Disp:  , Rfl:    ondansetron (ZOFRAN-ODT) 4 MG disintegrating tablet, Take 1 tablet (4 mg total) by mouth every 8 (eight) hours as needed for nausea or vomiting., Disp: 10 tablet, Rfl: 0   pregabalin (LYRICA) 75 MG capsule, Take 75 mg 3 (three) times daily by mouth. , Disp: , Rfl:    prochlorperazine (COMPAZINE) 10 MG tablet, Take 1 tablet (10 mg total) by mouth every 6 (six) hours as needed for nausea or vomiting., Disp: 30 tablet, Rfl: 0   promethazine (PHENERGAN) 25 MG suppository, Place 1 suppository (25 mg total) rectally every 6 (six) hours as needed for nausea or vomiting., Disp: 12 each, Rfl: 0   QUEtiapine (SEROQUEL) 300 MG tablet, Take 300 mg by mouth at bedtime., Disp: , Rfl:    rosuvastatin (CRESTOR) 20 MG tablet, Take 1  tablet (20 mg total) by mouth daily., Disp: 90 tablet, Rfl: 3   sucralfate (CARAFATE) 1 GM/10ML suspension, Take 10 mLs by mouth 2 (two) times daily as needed (abdomen pain)., Disp: , Rfl:    topiramate (TOPAMAX) 25 MG tablet, Take 75 mg by mouth at bedtime., Disp: , Rfl:    torsemide (DEMADEX) 20 MG tablet, Take 1 tablet (20 mg total) by mouth at bedtime., Disp: 90 tablet, Rfl: 3   TRANSDERM-SCOP 1 MG/3DAYS, Place 1 patch (1.5 mg total) onto the skin every 3 (three) days. As needed for nausea, Disp: 10 patch, Rfl: 0   Vitamin D, Ergocalciferol, (DRISDOL) 1.25 MG (50000 UNIT) CAPS capsule, Take 50,000 Units by mouth once a week., Disp: , Rfl:    Assessment:     ICD-10-CM   1. Chronic diastolic congestive heart failure (HCC)  I50.32     2. Essential hypertension  I10     3. History of pulmonary embolism  Z86.711     4. Other iron deficiency anemia  D50.8 Folate    Iron and TIBC      No orders of the defined types were placed in this encounter.  There are no discontinued medications.  Recommendations:   PAYDEN PAULINO  is a 52 y.o. female  with Sjogren's syndrome, SLE, nonischemic cardiomyopathy, severe LVEF in 2018, and again in 2021, with intermittent improvement in LVEF by echo, last echocardiogram in February 2023 revealing preserved LVEF,  morbid obesity, obesity hypoventilation syndrome, uncontrolled type 2 DM, hypertension, h/o CVA in 2014, OSA on CPAP and hepatic  adenoma s/p embolization on 08/12/19.  She also developed submassive bilateral pulmonary emboli in September 2022 when she was admitted with pneumonia and presently on long-term anticoagulation with Eliquis DVT dose.     1. Chronic diastolic congestive heart failure (HCC) Patient has had multiple hospitalization recently with nausea and vomiting, abdominal discomfort, reviewed the chart, fortunately in spite of multiple admission to the hospital, she remained relatively stable from cardiac standpoint.  Presently having  chronic dyspnea but states that overall she is stable.  Occasionally she has noticed leg edema which she responds with diuretics.  She does have chronic orthopnea due  to obesity hypoventilation as well and this is unchanged.  She has not had any chest pain or palpitations.  2. Essential hypertension Patient states that recent evaluations by other physicians and rheumatologist, blood pressure has been well-controlled with a combination of torsemide as needed and BiDil 1 tablet 3 times a day carvedilol 37.5 mg twice daily.  Her LVEF has remained stable and normal.  3. History of pulmonary embolism Patient has submassive PE in the past and presently on Eliquis at DVT prevention dose of 2.5 mg twice daily.  However recently has developed anemia and needs further workup.  She is being evaluated by hematology as well, I placed orders for anemia panel today.  4. Other iron deficiency anemia Please see above. - Folate - Iron and TIBC  From cardiac standpoint in spite of her multiple medical comorbidity, she has done well, I will see her back at any point however I would like to see her back in 6 months for close monitoring.  Patient is aware to contact me for dyspnea gets worse or she develops worsening leg edema or chest pain to immediately contact me.    Yates Decamp, MD, [facility] 03/22/2023, 6:38 PM Office: [phone]

## 2023-04-24 ENCOUNTER — Encounter (HOSPITAL_COMMUNITY): Payer: Self-pay

## 2023-04-24 ENCOUNTER — Other Ambulatory Visit: Payer: Self-pay

## 2023-04-24 ENCOUNTER — Inpatient Hospital Stay (HOSPITAL_COMMUNITY)
Admission: EM | Admit: 2023-04-24 | Discharge: 2023-05-03 | DRG: 074 | Disposition: A | Discharge status: Home / Self Care | Payer: Medicaid Other | Attending: Internal Medicine | Admitting: Internal Medicine

## 2023-04-24 ENCOUNTER — Emergency Department (HOSPITAL_COMMUNITY): Payer: Medicaid Other

## 2023-04-24 DIAGNOSIS — M3500 Sicca syndrome, unspecified: Secondary | ICD-10-CM | POA: Diagnosis present

## 2023-04-24 DIAGNOSIS — G8929 Other chronic pain: Secondary | ICD-10-CM | POA: Diagnosis present

## 2023-04-24 DIAGNOSIS — K3184 Gastroparesis: Secondary | ICD-10-CM | POA: Diagnosis present

## 2023-04-24 DIAGNOSIS — N390 Urinary tract infection, site not specified: Secondary | ICD-10-CM | POA: Diagnosis present

## 2023-04-24 DIAGNOSIS — F32A Depression, unspecified: Secondary | ICD-10-CM | POA: Diagnosis present

## 2023-04-24 DIAGNOSIS — Z885 Allergy status to narcotic agent status: Secondary | ICD-10-CM

## 2023-04-24 DIAGNOSIS — D638 Anemia in other chronic diseases classified elsewhere: Secondary | ICD-10-CM | POA: Diagnosis present

## 2023-04-24 DIAGNOSIS — Z6841 Body Mass Index (BMI) 40.0 and over, adult: Secondary | ICD-10-CM

## 2023-04-24 DIAGNOSIS — Z7951 Long term (current) use of inhaled steroids: Secondary | ICD-10-CM

## 2023-04-24 DIAGNOSIS — Z794 Long term (current) use of insulin: Secondary | ICD-10-CM

## 2023-04-24 DIAGNOSIS — Z7901 Long term (current) use of anticoagulants: Secondary | ICD-10-CM

## 2023-04-24 DIAGNOSIS — Z86711 Personal history of pulmonary embolism: Secondary | ICD-10-CM | POA: Diagnosis present

## 2023-04-24 DIAGNOSIS — Z833 Family history of diabetes mellitus: Secondary | ICD-10-CM

## 2023-04-24 DIAGNOSIS — M329 Systemic lupus erythematosus, unspecified: Secondary | ICD-10-CM | POA: Diagnosis present

## 2023-04-24 DIAGNOSIS — I5032 Chronic diastolic (congestive) heart failure: Secondary | ICD-10-CM | POA: Diagnosis present

## 2023-04-24 DIAGNOSIS — Z8249 Family history of ischemic heart disease and other diseases of the circulatory system: Secondary | ICD-10-CM

## 2023-04-24 DIAGNOSIS — Z825 Family history of asthma and other chronic lower respiratory diseases: Secondary | ICD-10-CM

## 2023-04-24 DIAGNOSIS — Z79899 Other long term (current) drug therapy: Secondary | ICD-10-CM

## 2023-04-24 DIAGNOSIS — Z803 Family history of malignant neoplasm of breast: Secondary | ICD-10-CM

## 2023-04-24 DIAGNOSIS — F419 Anxiety disorder, unspecified: Secondary | ICD-10-CM | POA: Diagnosis present

## 2023-04-24 DIAGNOSIS — E861 Hypovolemia: Secondary | ICD-10-CM | POA: Diagnosis not present

## 2023-04-24 DIAGNOSIS — Z8042 Family history of malignant neoplasm of prostate: Secondary | ICD-10-CM

## 2023-04-24 DIAGNOSIS — E7800 Pure hypercholesterolemia, unspecified: Secondary | ICD-10-CM | POA: Diagnosis present

## 2023-04-24 DIAGNOSIS — Z7984 Long term (current) use of oral hypoglycemic drugs: Secondary | ICD-10-CM

## 2023-04-24 DIAGNOSIS — E039 Hypothyroidism, unspecified: Secondary | ICD-10-CM | POA: Diagnosis present

## 2023-04-24 DIAGNOSIS — R112 Nausea with vomiting, unspecified: Principal | ICD-10-CM | POA: Diagnosis present

## 2023-04-24 DIAGNOSIS — Z79891 Long term (current) use of opiate analgesic: Secondary | ICD-10-CM

## 2023-04-24 DIAGNOSIS — B961 Klebsiella pneumoniae [K. pneumoniae] as the cause of diseases classified elsewhere: Secondary | ICD-10-CM | POA: Diagnosis present

## 2023-04-24 DIAGNOSIS — Z1611 Resistance to penicillins: Secondary | ICD-10-CM | POA: Diagnosis present

## 2023-04-24 DIAGNOSIS — R531 Weakness: Secondary | ICD-10-CM

## 2023-04-24 DIAGNOSIS — Z87891 Personal history of nicotine dependence: Secondary | ICD-10-CM

## 2023-04-24 DIAGNOSIS — N179 Acute kidney failure, unspecified: Secondary | ICD-10-CM | POA: Diagnosis not present

## 2023-04-24 DIAGNOSIS — Z888 Allergy status to other drugs, medicaments and biological substances status: Secondary | ICD-10-CM

## 2023-04-24 DIAGNOSIS — E876 Hypokalemia: Secondary | ICD-10-CM | POA: Diagnosis present

## 2023-04-24 DIAGNOSIS — G4733 Obstructive sleep apnea (adult) (pediatric): Secondary | ICD-10-CM | POA: Diagnosis present

## 2023-04-24 DIAGNOSIS — E440 Moderate protein-calorie malnutrition: Secondary | ICD-10-CM | POA: Insufficient documentation

## 2023-04-24 DIAGNOSIS — Z79890 Hormone replacement therapy (postmenopausal): Secondary | ICD-10-CM

## 2023-04-24 DIAGNOSIS — I10 Essential (primary) hypertension: Secondary | ICD-10-CM | POA: Diagnosis present

## 2023-04-24 DIAGNOSIS — Z8041 Family history of malignant neoplasm of ovary: Secondary | ICD-10-CM

## 2023-04-24 DIAGNOSIS — Z800 Family history of malignant neoplasm of digestive organs: Secondary | ICD-10-CM

## 2023-04-24 DIAGNOSIS — I110 Hypertensive heart disease with heart failure: Secondary | ICD-10-CM | POA: Diagnosis present

## 2023-04-24 DIAGNOSIS — K449 Diaphragmatic hernia without obstruction or gangrene: Secondary | ICD-10-CM | POA: Diagnosis present

## 2023-04-24 DIAGNOSIS — E1169 Type 2 diabetes mellitus with other specified complication: Secondary | ICD-10-CM | POA: Diagnosis present

## 2023-04-24 DIAGNOSIS — Z823 Family history of stroke: Secondary | ICD-10-CM

## 2023-04-24 DIAGNOSIS — Z86718 Personal history of other venous thrombosis and embolism: Secondary | ICD-10-CM

## 2023-04-24 DIAGNOSIS — E1143 Type 2 diabetes mellitus with diabetic autonomic (poly)neuropathy: Principal | ICD-10-CM | POA: Diagnosis present

## 2023-04-24 LAB — CBC
HCT: 31.9 % — ABNORMAL LOW (ref 36.0–46.0)
Hemoglobin: 10.2 g/dL — ABNORMAL LOW (ref 12.0–15.0)
MCH: 27.8 pg (ref 26.0–34.0)
MCHC: 32 g/dL (ref 30.0–36.0)
MCV: 86.9 fL (ref 80.0–100.0)
Platelets: 215 10*3/uL (ref 150–400)
RBC: 3.67 MIL/uL — ABNORMAL LOW (ref 3.87–5.11)
RDW: 16 % — ABNORMAL HIGH (ref 11.5–15.5)
WBC: 9.7 10*3/uL (ref 4.0–10.5)
nRBC: 0 % (ref 0.0–0.2)

## 2023-04-24 LAB — COMPREHENSIVE METABOLIC PANEL
ALT: 8 U/L (ref 0–44)
AST: 12 U/L — ABNORMAL LOW (ref 15–41)
Albumin: 2.9 g/dL — ABNORMAL LOW (ref 3.5–5.0)
Alkaline Phosphatase: 61 U/L (ref 38–126)
Anion gap: 9 (ref 5–15)
BUN: 7 mg/dL (ref 6–20)
CO2: 25 mmol/L (ref 22–32)
Calcium: 8.6 mg/dL — ABNORMAL LOW (ref 8.9–10.3)
Chloride: 105 mmol/L (ref 98–111)
Creatinine, Ser: 0.95 mg/dL (ref 0.44–1.00)
GFR, Estimated: >60 mL/min (ref >60)
Glucose, Bld: 121 mg/dL — ABNORMAL HIGH (ref 70–99)
Potassium: 2.8 mmol/L — ABNORMAL LOW (ref 3.5–5.1)
Sodium: 139 mmol/L (ref 135–145)
Total Bilirubin: 1.1 mg/dL (ref 0.3–1.2)
Total Protein: 6.6 g/dL (ref 6.5–8.1)

## 2023-04-24 LAB — LIPASE, BLOOD: Lipase: 24 U/L (ref 11–51)

## 2023-04-24 MED ORDER — DICYCLOMINE HCL 10 MG/ML IM SOLN: 20.0000 mg | Freq: Once | INTRAMUSCULAR | Status: DC

## 2023-04-24 MED ORDER — DIPHENHYDRAMINE HCL 50 MG/ML IJ SOLN
50.0000 mg | Freq: Once | INTRAMUSCULAR | Status: AC
  Administered 2023-04-24: 50 mg via INTRAVENOUS
  Filled 2023-04-24: qty 1

## 2023-04-24 MED ORDER — ONDANSETRON HCL 4 MG/2ML IJ SOLN
4.0000 mg | Freq: Once | INTRAMUSCULAR | Status: AC
  Administered 2023-04-24: 4 mg via INTRAVENOUS
  Filled 2023-04-24: qty 2

## 2023-04-24 MED ORDER — SODIUM CHLORIDE 0.9 % IV BOLUS
1000.0000 mL | Freq: Once | INTRAVENOUS | Status: AC
  Administered 2023-04-24: 1000 mL via INTRAVENOUS

## 2023-04-24 MED ORDER — DROPERIDOL 2.5 MG/ML IJ SOLN
2.5000 mg | Freq: Once | INTRAMUSCULAR | Status: AC
  Administered 2023-04-24: 2.5 mg via INTRAVENOUS
  Filled 2023-04-24: qty 2

## 2023-04-24 MED ORDER — PROCHLORPERAZINE EDISYLATE 10 MG/2ML IJ SOLN: 5.0000 mg | Freq: Once | INTRAMUSCULAR | Status: DC

## 2023-04-24 NOTE — ED Triage Notes (Signed)
Patient BIB [facility] for N/V x2 weeks with Chest wall pain x 1 week when vomiting. Patient A&Ox4, uses walker at baseline reports 10/10 chest pain and abd pain, patient states she is unable to keep anything down x2 weeks including water.

## 2023-04-24 NOTE — ED Provider Notes (Signed)
[place] EMERGENCY DEPARTMENT AT [facility] Provider Note   CSN: [id number] Arrival date & time: 04/24/23  4098     History Chief Complaint  Patient presents with   Abdominal Pain   Emesis    HPI [name] is a 52 y.o. female presenting for N/V and abdominal pain that have been ongoing for the last two weeks. The abdominal pain is in the upper abdomen. It is cramping and does not go to the back. NBNB emesis. Denies any diarrhea. Has not had a bowel movement. Last time she had a BM >1 week. Is not aware when was the last time that she passed gas. Abdomen hurts when she touches it but denies distention. No fevers, no chills, nothing new food wise. Does not know if she had weight loss. No sick contacts.   Has chest pain. Has history of CHF. Central chest pain. Does Feels some pain on her arm. Has been present for about a week. Short of breath. Using more pillows than usual (5) sleeping sitting up feels that her chest pain gets worse if she lays down. No cough. No leg swelling.   In the past had gotten genetic testing. Does not have the BRCA 1 mutation.  Had a liver mass in the past, but it was not cancerous.   LMP has been more than 10 years. Had abnormal uterine bleeding in the past. And physician put her on megesterol and that helped. Still on megesterol. Had a vaginal ultrasound and nothing was found. Not currently sexually active, no history of STI. Denies any lesions, rashes, discharge.   Does not smoke marijuana, has never smoke. Denies smoking cigarettes. Smoked when she was 18, stopped when she found out when she was pregnant at 47.   No difficulty urinating. No burning with urination. Not urinating more than  usual. Has flank pain on the left.    Has a history of hypothyroidism. Is on levothyroxine takes it before eating   Sent home with reglan.              Family Hx:  Sister Ovarian Cancer  Maternal Aunts Stomach Cancer   Maternal Aunt  Breast Cancer  Maternal Aunt Breast Cancer Maternal Uncle Stomach Cancer   Maternal Cousin Breast cancer  Maternal Cousins Breast Cancer x 10, 4 had concurrent ovarian cancer.        Has not been able to follow up with GI at [facility].       Recently hospitalized drains.    Patient's recorded medical, surgical, social, medication list and allergies were reviewed in the Snapshot window as part of the initial history.   Review of Systems   Review of Systems  Physical Exam Updated Vital Signs BP 125/70 (BP Location: Right Arm)   Pulse 80   Temp 98 F (36.7 C) (Oral)   Resp 18   LMP 03/24/2017   SpO2 100%  Physical Exam   ED Course/ Medical Decision Making/ A&P    Procedures Procedures   Medications Ordered in ED Medications  dicyclomine (BENTYL) injection 20 mg (has no administration in time range)  ondansetron (ZOFRAN) injection 4 mg (has no administration in time range)  sodium chloride 0.9 % bolus 1,000 mL (has no administration in time range)    Medical Decision Making:    [name] is a 52 y.o. female who presented to the ED today with *** detailed above.     {crccomplexity:27900}  Complete initial physical exam  performed, notably the patient  was ***.      Reviewed and confirmed nursing documentation for past medical history, family history, social history.    Initial Assessment:   With the patient's presentation of ***, most likely diagnosis is ***. Other diagnoses were considered including (but not limited to) ***. These are considered less likely due to history of present illness and physical exam findings.   {crccopa:27899}  Initial Plan:  ***  ***Screening labs including CBC and Metabolic panel to evaluate for infectious or metabolic etiology of disease.  ***Urinalysis with reflex culture ordered to evaluate for UTI or relevant urologic/nephrologic pathology.  ***CXR to evaluate for structural/infectious intrathoracic pathology.   ***EKG to evaluate for cardiac pathology. Objective evaluation as below reviewed with plan for close reassessment  Initial Study Results:   Laboratory  All laboratory results reviewed without evidence of clinically relevant pathology.   ***Exceptions include: ***   ***EKG EKG was reviewed independently. Rate, rhythm, axis, intervals all examined and without medically relevant abnormality. ST segments without concerns for elevations.    Radiology  All images reviewed independently. ***Agree with radiology report at this time.   No results found.   Consults:  Case discussed with ***.   Reassessment and Plan:   ***   ***  Clinical Impression: No diagnosis found.   Data Unavailable   Final Clinical Impression(s) / ED Diagnoses Final diagnoses:  None    Rx / DC Orders ED Discharge Orders     None

## 2023-04-24 NOTE — ED Provider Triage Note (Signed)
Emergency Medicine Provider Triage Evaluation Note  [name] , a 52 y.o. female  was evaluated in triage.  Pt complains of N/V, and abdominal pain.  Patient has had nausea vomiting for several weeks and developed abdominal pain in the past couple days.  She reports that she is unable to keep any food down or take her medications.  Review of Systems  Positive: N/V, abdominal pain Negative: fever  Physical Exam  BP 136/79 (BP Location: Left Arm)   Pulse 96   Temp 98.9 F (37.2 C)   Resp 18   LMP 03/24/2017   SpO2 100%  Gen:   Awake, ill-appearing Resp:  Normal effort  MSK:   Moves extremities without difficulty  Other:    Medical Decision Making  Medically screening exam initiated at 7:36 PM.  Appropriate orders placed.  [name] was informed that the remainder of the evaluation will be completed by another provider, this initial triage assessment does not replace that evaluation, and the importance of remaining in the ED until their evaluation is complete.   Maxwell Marion, PA-C 04/24/23 1941

## 2023-04-24 NOTE — ED Notes (Signed)
I tried to get blood twice unable to get blood

## 2023-04-25 ENCOUNTER — Encounter (HOSPITAL_COMMUNITY): Payer: Self-pay | Admitting: Family Medicine

## 2023-04-25 DIAGNOSIS — F419 Anxiety disorder, unspecified: Secondary | ICD-10-CM | POA: Diagnosis not present

## 2023-04-25 DIAGNOSIS — I5032 Chronic diastolic (congestive) heart failure: Secondary | ICD-10-CM | POA: Diagnosis not present

## 2023-04-25 DIAGNOSIS — Z86711 Personal history of pulmonary embolism: Secondary | ICD-10-CM

## 2023-04-25 DIAGNOSIS — E1169 Type 2 diabetes mellitus with other specified complication: Secondary | ICD-10-CM

## 2023-04-25 DIAGNOSIS — F32A Depression, unspecified: Secondary | ICD-10-CM | POA: Diagnosis present

## 2023-04-25 DIAGNOSIS — E876 Hypokalemia: Secondary | ICD-10-CM

## 2023-04-25 DIAGNOSIS — M329 Systemic lupus erythematosus, unspecified: Secondary | ICD-10-CM

## 2023-04-25 DIAGNOSIS — M3500 Sicca syndrome, unspecified: Secondary | ICD-10-CM

## 2023-04-25 DIAGNOSIS — I10 Essential (primary) hypertension: Secondary | ICD-10-CM

## 2023-04-25 DIAGNOSIS — E785 Hyperlipidemia, unspecified: Secondary | ICD-10-CM

## 2023-04-25 DIAGNOSIS — R112 Nausea with vomiting, unspecified: Secondary | ICD-10-CM | POA: Diagnosis not present

## 2023-04-25 LAB — BASIC METABOLIC PANEL
Anion gap: 12 (ref 5–15)
BUN: 8 mg/dL (ref 6–20)
CO2: 21 mmol/L — ABNORMAL LOW (ref 22–32)
Calcium: 8.3 mg/dL — ABNORMAL LOW (ref 8.9–10.3)
Chloride: 103 mmol/L (ref 98–111)
Creatinine, Ser: 0.84 mg/dL (ref 0.44–1.00)
GFR, Estimated: >60 mL/min (ref >60)
Glucose, Bld: 135 mg/dL — ABNORMAL HIGH (ref 70–99)
Potassium: 3.2 mmol/L — ABNORMAL LOW (ref 3.5–5.1)
Sodium: 136 mmol/L (ref 135–145)

## 2023-04-25 LAB — CBC
HCT: 30.8 % — ABNORMAL LOW (ref 36.0–46.0)
Hemoglobin: 9.9 g/dL — ABNORMAL LOW (ref 12.0–15.0)
MCH: 28.3 pg (ref 26.0–34.0)
MCHC: 32.1 g/dL (ref 30.0–36.0)
MCV: 88 fL (ref 80.0–100.0)
Platelets: 208 10*3/uL (ref 150–400)
RBC: 3.5 MIL/uL — ABNORMAL LOW (ref 3.87–5.11)
RDW: 16.5 % — ABNORMAL HIGH (ref 11.5–15.5)
WBC: 9.1 10*3/uL (ref 4.0–10.5)
nRBC: 0 % (ref 0.0–0.2)

## 2023-04-25 LAB — MAGNESIUM
Magnesium: 1.7 mg/dL (ref 1.7–2.4)
Magnesium: 1.7 mg/dL (ref 1.7–2.4)

## 2023-04-25 LAB — GLUCOSE, CAPILLARY
Glucose-Capillary: 102 mg/dL — ABNORMAL HIGH (ref 70–99)
Glucose-Capillary: 165 mg/dL — ABNORMAL HIGH (ref 70–99)
Glucose-Capillary: 113 mg/dL — ABNORMAL HIGH (ref 70–99)

## 2023-04-25 LAB — HEMOGLOBIN A1C
Hgb A1c MFr Bld: 7.3 % — ABNORMAL HIGH (ref 4.8–5.6)
Mean Plasma Glucose: 162.81 mg/dL

## 2023-04-25 MED ORDER — INSULIN GLARGINE-YFGN 100 UNIT/ML ~~LOC~~ SOLN: 50.0000 [IU] | Freq: Two times a day (BID) | SUBCUTANEOUS | Status: DC

## 2023-04-25 MED ORDER — SODIUM CHLORIDE 0.9 % IV SOLN
12.5000 mg | Freq: Once | INTRAVENOUS | Status: AC
  Administered 2023-04-25: 12.5 mg via INTRAVENOUS
  Filled 2023-04-25: qty 12.5

## 2023-04-25 MED ORDER — HYDROXYCHLOROQUINE SULFATE 200 MG PO TABS
200.0000 mg | ORAL_TABLET | Freq: Two times a day (BID) | ORAL | Status: DC
  Administered 2023-04-25 – 2023-05-03 (×16): 200 mg via ORAL
  Filled 2023-04-25 (×17): qty 1

## 2023-04-25 MED ORDER — HYDROMORPHONE HCL 1 MG/ML IJ SOLN
0.5000 mg | INTRAMUSCULAR | Status: DC | PRN
  Administered 2023-04-25 – 2023-05-01 (×29): 0.5 mg via INTRAVENOUS
  Filled 2023-04-25 (×29): qty 1

## 2023-04-25 MED ORDER — ROSUVASTATIN CALCIUM 20 MG PO TABS
20.0000 mg | ORAL_TABLET | Freq: Every day | ORAL | Status: DC
  Administered 2023-04-25 – 2023-05-03 (×9): 20 mg via ORAL
  Filled 2023-04-25 (×5): qty 1
  Filled 2023-04-25: qty 4
  Filled 2023-04-25 (×3): qty 1

## 2023-04-25 MED ORDER — ACETAMINOPHEN 325 MG PO TABS
650.0000 mg | ORAL_TABLET | Freq: Four times a day (QID) | ORAL | Status: DC | PRN
  Administered 2023-05-03: 650 mg via ORAL
  Filled 2023-04-25 (×2): qty 2

## 2023-04-25 MED ORDER — INSULIN ASPART 100 UNIT/ML IJ SOLN
0.0000 [IU] | INTRAMUSCULAR | Status: DC
  Administered 2023-04-25: 2 [IU] via SUBCUTANEOUS

## 2023-04-25 MED ORDER — PANTOPRAZOLE SODIUM 40 MG IV SOLR
40.0000 mg | INTRAVENOUS | Status: DC
  Administered 2023-04-25 – 2023-04-27 (×3): 40 mg via INTRAVENOUS
  Filled 2023-04-25 (×3): qty 10

## 2023-04-25 MED ORDER — ONDANSETRON HCL 4 MG/2ML IJ SOLN
4.0000 mg | Freq: Four times a day (QID) | INTRAMUSCULAR | Status: DC | PRN
  Administered 2023-04-25 – 2023-04-30 (×10): 4 mg via INTRAVENOUS
  Filled 2023-04-25 (×10): qty 2

## 2023-04-25 MED ORDER — KETOROLAC TROMETHAMINE 30 MG/ML IJ SOLN: 30.0000 mg | Freq: Four times a day (QID) | INTRAMUSCULAR | Status: DC | PRN

## 2023-04-25 MED ORDER — PROCHLORPERAZINE EDISYLATE 10 MG/2ML IJ SOLN
5.0000 mg | INTRAMUSCULAR | Status: DC | PRN
  Administered 2023-04-25 – 2023-04-27 (×5): 5 mg via INTRAVENOUS
  Filled 2023-04-25 (×6): qty 2

## 2023-04-25 MED ORDER — PANTOPRAZOLE SODIUM 40 MG IV SOLR: 40.0000 mg | INTRAVENOUS | Status: DC

## 2023-04-25 MED ORDER — ACETAMINOPHEN 650 MG RE SUPP: 650.0000 mg | Freq: Four times a day (QID) | RECTAL | Status: DC | PRN

## 2023-04-25 MED ORDER — POTASSIUM CHLORIDE 10 MEQ/100ML IV SOLN
10.0000 meq | INTRAVENOUS | Status: AC
  Administered 2023-04-25 (×6): 10 meq via INTRAVENOUS
  Filled 2023-04-25 (×6): qty 100

## 2023-04-25 MED ORDER — PREGABALIN 25 MG PO CAPS
75.0000 mg | ORAL_CAPSULE | Freq: Three times a day (TID) | ORAL | Status: DC
  Administered 2023-04-25 – 2023-05-03 (×22): 75 mg via ORAL
  Filled 2023-04-25 (×24): qty 3

## 2023-04-25 MED ORDER — ONDANSETRON HCL 4 MG PO TABS: 4.0000 mg | ORAL_TABLET | Freq: Four times a day (QID) | ORAL | Status: DC | PRN

## 2023-04-25 MED ORDER — ISOSORB DINITRATE-HYDRALAZINE 20-37.5 MG PO TABS
1.0000 | ORAL_TABLET | Freq: Three times a day (TID) | ORAL | Status: DC
  Administered 2023-04-25 – 2023-04-28 (×7): 1 via ORAL
  Filled 2023-04-25 (×11): qty 1

## 2023-04-25 MED ORDER — QUETIAPINE FUMARATE 100 MG PO TABS
300.0000 mg | ORAL_TABLET | Freq: Every day | ORAL | Status: DC
  Administered 2023-04-25 – 2023-05-02 (×4): 300 mg via ORAL
  Filled 2023-04-25 (×6): qty 3

## 2023-04-25 MED ORDER — POTASSIUM CHLORIDE IN NACL 20-0.9 MEQ/L-% IV SOLN
INTRAVENOUS | Status: AC
  Filled 2023-04-25 (×4): qty 1000

## 2023-04-25 MED ORDER — INSULIN GLARGINE-YFGN 100 UNIT/ML ~~LOC~~ SOLN
20.0000 [IU] | Freq: Every day | SUBCUTANEOUS | Status: DC
  Administered 2023-04-25: 20 [IU] via SUBCUTANEOUS
  Filled 2023-04-25 (×2): qty 0.2

## 2023-04-25 MED ORDER — ALBUTEROL SULFATE (2.5 MG/3ML) 0.083% IN NEBU: 2.5000 mg | INHALATION_SOLUTION | Freq: Four times a day (QID) | RESPIRATORY_TRACT | Status: DC | PRN

## 2023-04-25 MED ORDER — MAGNESIUM SULFATE 2 GM/50ML IV SOLN
2.0000 g | Freq: Once | INTRAVENOUS | Status: AC
  Administered 2023-04-25: 2 g via INTRAVENOUS
  Filled 2023-04-25: qty 50

## 2023-04-25 MED ORDER — POTASSIUM CHLORIDE 10 MEQ/100ML IV SOLN
10.0000 meq | INTRAVENOUS | Status: AC
  Administered 2023-04-25 (×4): 10 meq via INTRAVENOUS
  Filled 2023-04-25 (×4): qty 100

## 2023-04-25 MED ORDER — CARVEDILOL 25 MG PO TABS
37.5000 mg | ORAL_TABLET | Freq: Two times a day (BID) | ORAL | Status: DC
  Administered 2023-04-25 – 2023-04-28 (×5): 37.5 mg via ORAL
  Filled 2023-04-25 (×9): qty 1

## 2023-04-25 MED ORDER — LEVOTHYROXINE SODIUM 25 MCG PO TABS
50.0000 ug | ORAL_TABLET | Freq: Every day | ORAL | Status: DC
  Administered 2023-04-27 – 2023-05-03 (×7): 50 ug via ORAL
  Filled 2023-04-25 (×8): qty 2

## 2023-04-25 MED ORDER — BUPROPION HCL ER (XL) 300 MG PO TB24
300.0000 mg | ORAL_TABLET | Freq: Every day | ORAL | Status: DC
  Administered 2023-04-25 – 2023-05-03 (×9): 300 mg via ORAL
  Filled 2023-04-25 (×9): qty 1

## 2023-04-25 MED ORDER — SODIUM CHLORIDE 0.9% FLUSH
3.0000 mL | Freq: Two times a day (BID) | INTRAVENOUS | Status: DC
  Administered 2023-04-25 – 2023-05-03 (×11): 3 mL via INTRAVENOUS

## 2023-04-25 MED ORDER — MAGNESIUM SULFATE 2 GM/50ML IV SOLN: 2.0000 g | Freq: Once | INTRAVENOUS | Status: DC

## 2023-04-25 MED ORDER — ARIPIPRAZOLE 5 MG PO TABS
5.0000 mg | ORAL_TABLET | Freq: Every day | ORAL | Status: DC
  Administered 2023-04-25 – 2023-05-03 (×8): 5 mg via ORAL
  Filled 2023-04-25 (×9): qty 1

## 2023-04-25 MED ORDER — TOPIRAMATE 25 MG PO TABS
75.0000 mg | ORAL_TABLET | Freq: Every day | ORAL | Status: DC
  Administered 2023-04-25 – 2023-05-02 (×4): 75 mg via ORAL
  Filled 2023-04-25 (×9): qty 3

## 2023-04-25 MED ORDER — ALPRAZOLAM 0.5 MG PO TABS
0.5000 mg | ORAL_TABLET | Freq: Two times a day (BID) | ORAL | Status: DC | PRN
  Administered 2023-04-25: 0.5 mg via ORAL
  Filled 2023-04-25 (×3): qty 1

## 2023-04-25 MED ORDER — APIXABAN 2.5 MG PO TABS
2.5000 mg | ORAL_TABLET | Freq: Two times a day (BID) | ORAL | Status: DC
  Administered 2023-04-25 – 2023-05-03 (×17): 2.5 mg via ORAL
  Filled 2023-04-25 (×17): qty 1

## 2023-04-25 MED ORDER — KETOROLAC TROMETHAMINE 15 MG/ML IJ SOLN
15.0000 mg | Freq: Once | INTRAMUSCULAR | Status: AC
  Administered 2023-04-25: 15 mg via INTRAVENOUS
  Filled 2023-04-25: qty 1

## 2023-04-25 NOTE — ED Provider Notes (Signed)
Assumed care from Dr. Doran Durand and Dr. Salvatore Marvel.  52 y.o. F with morbid obesity, sdCHF recovered from 30-35% to 55-60%, hx VTE on Eliquis, hypothyroidism, hx SLE/Sjogrens on azathioprine, Benlysta, Plaquenil, and hx DM with gastroparesis   Reports intractable nausea and vomiting abdominal pain for the past 2 weeks unable to keep any medications down.  CT abdomen pelvis is negative for acute surgical pathology  Patient continues to state elevated p.o. and is having nausea and vomiting and dry heaving.  Will admit for symptom control.  Discussed with Dr. Laurell Roof, Jeannett Senior, MD 04/25/23 [phone]

## 2023-04-25 NOTE — H&P (Signed)
History and Physical    [name] VHQ:[id number] DOB: 10-21-1970 DOA: 04/24/2023  PCP: Verlon Au, MD   Patient coming from: Home   Chief Complaint: Upper abdominal pain, N/V   HPI: [name] is a 52 y.o. female with medical history significant for insulin-dependent diabetes mellitus, chronic HFpEF, history of DVT on Eliquis, hypothyroidism, depression, anxiety, lupus, Sjogren syndrome, and BMI 55 who presents to the emergency department with upper abdominal pain, nausea, and vomiting.  Patient reports 2 weeks of constant upper abdominal pain with nausea and non-bloody vomiting. She denies fever, chills, diarrhea, melena, or hematochezia. She reports having had the same symptoms many times in the past, sometimes requiring hospitalization, but it usually does not last this long.    She had a normal gastric emptying study in 2021.  She had a EGD in 2022 with gastritis.  ED Course: Upon arrival to the ED, patient is found to be afebrile and saturating well on room air with normal heart rate and stable blood pressure.  Labs are most notable for potassium 2.8, albumin 2.9, normal WBC, and normal renal function.  There are no acute findings on CT of the abdomen and pelvis.  Patient was treated in the ED with 1 L of normal saline, Benadryl, droperidol, Zofran, Phenergan, Toradol, and IV potassium.  Review of Systems:  All other systems reviewed and apart from HPI, are negative.  Past Medical History:  Diagnosis Date   Acute renal failure (HCC)     Intractable nausea vomiting secondary to diabetic gastroparesis causing dehydration and acute renal failure /notes 04/01/2013   Anginal pain (HCC)    Anxiety    Bell's palsy 08/09/2010   Bicornuate uterus    CAP (community acquired pneumonia) 10/2011   Hattie Perch 11/08/2011   Chest pain 01/13/2014   CHF (congestive heart failure) (HCC)    Diabetic gastroparesis associated with type 2 diabetes mellitus (HCC)    this is presumed  diagnoses, not confirmed by any studies.    Eczema    Family history of breast cancer    Family history of malignant neoplasm of breast    Family history of malignant neoplasm of ovary    Family history of ovarian cancer    Family history of prostate cancer    GERD (gastroesophageal reflux disease)    High cholesterol    Hypertension    Liver lesion 08/13/2019   Liver mass 2014   biopsied 03/2013 at [facility], not malignant.  is to undergo radiologic ablation of the mass in sept/October 2014.    Lupus (HCC)    Migraines    "maybe a couple times/yr" (04/01/2013)   Obesity    Obstructive sleep apnea on CPAP 2011   Oxygen at nights   Pulmonary embolism (HCC) 05/21/2021   SJOGREN'S SYNDROME 08/09/2010   SLE (systemic lupus erythematosus) (HCC)    Hattie Perch 12/01/2010    Past Surgical History:  Procedure Laterality Date   BREAST CYST EXCISION Left 08/2005   epidermoid   CARDIAC CATHETERIZATION  02/07/2012   ECTOPIC PREGNANCY SURGERY  1999   ESOPHAGOGASTRODUODENOSCOPY N/A 02/26/2013   Procedure: ESOPHAGOGASTRODUODENOSCOPY (EGD);  Surgeon: Hilarie Fredrickson, MD;  Location[facility] ENDOSCOPY;  Service: Endoscopy;  Laterality: N/A;   ESOPHAGOGASTRODUODENOSCOPY N/A 03/02/2015   Procedure: ESOPHAGOGASTRODUODENOSCOPY (EGD);  Surgeon: Rachael Fee, MD;  Location: [facility] ENDOSCOPY;  Service: Endoscopy;  Laterality: N/A;   ESOPHAGOGASTRODUODENOSCOPY N/A 02/23/2021   Procedure: ESOPHAGOGASTRODUODENOSCOPY (EGD);  Surgeon: Charlott Rakes, MD;  Location: [facility] ENDOSCOPY;  Service: Endoscopy;  Laterality: N/A;  possible dilation   HERNIA REPAIR     LEFT AND RIGHT HEART CATHETERIZATION WITH CORONARY ANGIOGRAM N/A 02/07/2012   Procedure: LEFT AND RIGHT HEART CATHETERIZATION WITH CORONARY ANGIOGRAM;  Surgeon: Pamella Pert, MD;  Location: [facility] CATH LAB;  Service: Cardiovascular;  Laterality: N/A;   LIVER BIOPSY  03/2013   liver mass/medical hx noted above   MUSCLE BIOPSY     for lupus/notes 12/01/2010    UMBILICAL HERNIA REPAIR  1980's    Social History:   reports that she quit smoking about 31 years ago. Her smoking use included cigarettes. She started smoking about 34 years ago. She has a 1.5 pack-year smoking history. She has never used smokeless tobacco. She reports that she does not drink alcohol and does not use drugs.  Allergies  Allergen Reactions   Metoclopramide Other (See Comments)    Developed restless leg, akathisia type limb movements.    Codeine Itching and Rash   Hydrocodone Rash   Percocet [Oxycodone-Acetaminophen] Hives and Rash    Tolerates dilaudid Tolerates acetaminophen     Family History  Problem Relation Age of Onset   Diabetes Mother    Asthma Mother    Urticaria Mother    Hypertension Mother    Stroke Mother    Ovarian cancer Sister 50       maternal half-sister; RAD51C positive   Asthma Brother    Asthma Brother    Breast cancer Maternal Aunt 34       currently 61   Cancer Maternal Aunt        unk. primary   Cancer Maternal Aunt        unk. primary   Ovarian cancer Maternal Aunt        deceased 15s   Breast cancer Maternal Aunt        deceased 32s   Breast cancer Maternal Aunt    Stomach cancer Maternal Uncle        dx 29s; deceased   Prostate cancer Maternal Uncle        deceased 17s   Breast cancer Cousin        deceased 67s; daughter of mat uncle with stomach ca   Hypertension Other    Stroke Other    Arthritis Other    Allergic rhinitis Neg Hx    Angioedema Neg Hx      Prior to Admission medications   Medication Sig Start Date End Date Taking? Authorizing Provider  ADVAIR HFA 230-21 MCG/ACT inhaler Inhale 2 puffs into the lungs 2 (two) times daily. 12/20/20   [provider]  albuterol (PROVENTIL) (2.5 MG/3ML) 0.083% nebulizer solution Take 2.5 mg by nebulization as needed for wheezing.    [provider]  ALPRAZolam Prudy Feeler) 0.5 MG tablet Take 0.5 mg by mouth as needed for anxiety or sleep. 04/19/17   [provider]  apixaban (ELIQUIS) 2.5 MG TABS tablet Take 1 tablet (2.5 mg total) by mouth 2 (two) times daily. 09/21/22   Yates Decamp, MD  ARIPiprazole (ABILIFY) 5 MG tablet  01/14/21   [provider]  B-D UF III MINI PEN NEEDLES 31G X 5 MM MISC USE AS DIRECTED TWICE A DAY 10/31/18   Romero Belling, MD  buPROPion (WELLBUTRIN XL) 300 MG 24 hr tablet Take 300 mg by mouth daily. 03/14/22   [provider]  carvedilol (COREG) 25 MG tablet Take 1.5 tablets (37.5 mg total) by mouth 2 (two) times daily with a  meal. 01/01/21   Yates Decamp, MD  Continuous Blood Gluc Sensor (FREESTYLE LIBRE 3 SENSOR) MISC 1 Device by Does not apply route every 14 (fourteen) days. Apply 1 sensor on upper arm every 14 days for continuous glucose monitoring 05/25/22   Reather Littler, MD  cromolyn (OPTICROM) 4 % ophthalmic solution Place 1 drop into both eyes 2 (two) times daily. 01/14/21   [provider]  cycloSPORINE (RESTASIS) 0.05 % ophthalmic emulsion Place 1 drop into both eyes in the morning, at noon, and at bedtime.     [provider]  diclofenac Sodium (VOLTAREN) 1 % GEL APPLY 2 GRAMS TO AFFECTED AREA 4 TIMES A DAY Patient taking differently: Apply 2 g topically 4 (four) times daily as needed (pain). 01/11/22   Cantwell, Celeste C, PA-C  EASY TOUCH PEN NEEDLES 32G X 4 MM MISC USE TO INJECT INSULIN TWICE DAILY 08/07/19   Romero Belling, MD  famotidine (PEPCID) 20 MG tablet Take 20 mg by mouth 2 (two) times daily.    [provider]  fluticasone (FLONASE) 50 MCG/ACT nasal spray Place 2 sprays into both nostrils continuous as needed for allergies or rhinitis.    [provider]  glucose blood (ACCU-CHEK GUIDE) test strip Use as instructed 02/22/23   Reather Littler, MD  glucose blood (ONETOUCH ULTRA) test strip Test blood sugar 4 times a day 03/30/22   Reather Littler, MD  hydroxychloroquine (PLAQUENIL) 200 MG tablet Take 200 mg by mouth 2 (two) times daily.    [provider]   ibuprofen (ADVIL) 600 MG tablet Take 1 tablet (600 mg total) by mouth every 6 (six) hours as needed. Patient taking differently: Take 600 mg by mouth every 6 (six) hours as needed for mild pain. 10/14/22   Roxy Horseman, PA-C  insulin regular human CONCENTRATED (HUMULIN R U-500 KWIKPEN) 500 UNIT/ML KwikPen 100 UNITS, 30 MINUTES BEFORE [facility] MEAL 11/17/22   Reather Littler, MD  Insulin Syringe-Needle U-100 (EASY TOUCH INSULIN SYRINGE) 31G X 5/16" 1 ML MISC USE AS DIRECTED THREE TIMES A DAY 10/12/18   Romero Belling, MD  isosorbide-hydrALAZINE (BIDIL) 20-37.5 MG tablet Take 1 tablet by mouth 3 (three) times daily. 03/06/23   Yates Decamp, MD  Lancets [facility] ULTRASOFT) lancets Test blood sugar 4 times a day 03/30/22   Reather Littler, MD  levothyroxine (SYNTHROID) 50 MCG tablet Take 1 tablet (50 mcg total) by mouth daily at 6 (six) AM. 08/30/19   Marguerita Merles Latif, DO  megestrol (MEGACE) 40 MG tablet Take 40 mg by mouth daily. 01/30/15   [provider]  metFORMIN (GLUCOPHAGE-XR) 500 MG 24 hr tablet Take 2 tablets (1,000 mg total) by mouth 2 (two) times daily. 02/02/21   Romero Belling, MD  Multiple Vitamin (MULTIVITAMIN WITH MINERALS) TABS tablet Take 1 tablet by mouth daily. 08/30/19   Marguerita Merles Latif, DO  ondansetron (ZOFRAN-ODT) 4 MG disintegrating tablet Take 1 tablet (4 mg total) by mouth every 8 (eight) hours as needed for nausea or vomiting. 10/14/22   Roxy Horseman, PA-C  pregabalin (LYRICA) 75 MG capsule Take 75 mg 3 (three) times daily by mouth.  01/28/15   [provider]  prochlorperazine (COMPAZINE) 10 MG tablet Take 1 tablet (10 mg total) by mouth every 6 (six) hours as needed for nausea or vomiting. 05/14/22   Osvaldo Shipper, MD  promethazine (PHENERGAN) 25 MG suppository Place 1 suppository (25 mg total) rectally every 6 (six) hours as needed for nausea or vomiting. 10/21/22   Lenard Simmer,  PA-C  QUEtiapine (SEROQUEL) 300 MG tablet Take 300 mg by mouth at bedtime. 01/08/21    [provider]  rosuvastatin (CRESTOR) 20 MG tablet Take 1 tablet (20 mg total) by mouth daily. 06/09/21 10/16/22  Cantwell, Celeste C, PA-C  sucralfate (CARAFATE) 1 GM/10ML suspension Take 10 mLs by mouth 2 (two) times daily as needed (abdomen pain). 12/09/20   [provider]  topiramate (TOPAMAX) 25 MG tablet Take 75 mg by mouth at bedtime. 03/17/16   [provider]  torsemide (DEMADEX) 20 MG tablet Take 1 tablet (20 mg total) by mouth at bedtime. 03/06/23   Yates Decamp, MD  TRANSDERM-SCOP 1 MG/3DAYS Place 1 patch (1.5 mg total) onto the skin every 3 (three) days. As needed for nausea 10/21/22   Melton Alar R, PA-C  Vitamin D, Ergocalciferol, (DRISDOL) 1.25 MG (50000 UNIT) CAPS capsule Take 50,000 Units by mouth once a week. 09/20/22   [provider]    Physical Exam: Vitals:   04/25/23 0250 04/25/23 0300 04/25/23 0315 04/25/23 0330  BP:  (!) 142/65 129/70 (!) 121/59  Pulse: 69 88 75 92  Resp: 15 16 16 14   Temp:      TempSrc:      SpO2: 99% 100% 97% 97%    Constitutional: NAD, no pallor or diaphoresis   Eyes: PERTLA, lids and conjunctivae normal ENMT: Mucous membranes are dry. Posterior pharynx clear of any exudate or lesions.   Neck: supple, no masses  Respiratory: no wheezing, no crackles. No accessory muscle use.  Cardiovascular: S1 & S2 heard, regular rate and rhythm. Mild bilateral ankle edema.  Abdomen: No distension, soft, no guarding. Bowel sounds active.  Musculoskeletal: no clubbing / cyanosis. No joint deformity upper and lower extremities.   Skin: no significant rashes, lesions, ulcers. Warm, dry, well-perfused. Neurologic: CN 2-12 grossly intact. Moving all extremities. Alert and oriented.  Psychiatric: Calm. Cooperative.    Labs and Imaging on Admission: I have personally reviewed following labs and imaging studies  CBC: Recent Labs  Lab 04/24/23 2249  WBC 9.7  HGB 10.2*  HCT 31.9*  MCV 86.9  PLT 215   Basic Metabolic  Panel: Recent Labs  Lab 04/24/23 2249 04/25/23 0121  NA 139  --   K 2.8*  --   CL 105  --   CO2 25  --   GLUCOSE 121*  --   BUN 7  --   CREATININE 0.95  --   CALCIUM 8.6*  --   MG  --  1.7   GFR: CrCl cannot be calculated (Unknown ideal weight.). Liver Function Tests: Recent Labs  Lab 04/24/23 2249  AST 12*  ALT 8  ALKPHOS 61  BILITOT 1.1  PROT 6.6  ALBUMIN 2.9*   Recent Labs  Lab 04/24/23 2249  LIPASE 24   No results for input(s): "AMMONIA" in the last 168 hours. Coagulation Profile: No results for input(s): "INR", "PROTIME" in the last 168 hours. Cardiac Enzymes: No results for input(s): "CKTOTAL", "CKMB", "CKMBINDEX", "TROPONINI" in the last 168 hours. BNP (last 3 results) No results for input(s): "PROBNP" in the last 8760 hours. HbA1C: No results for input(s): "HGBA1C" in the last 72 hours. CBG: No results for input(s): "GLUCAP" in the last 168 hours. Lipid Profile: No results for input(s): "CHOL", "HDL", "LDLCALC", "TRIG", "CHOLHDL", "LDLDIRECT" in the last 72 hours. Thyroid Function Tests: No results for input(s): "TSH", "T4TOTAL", "FREET4", "T3FREE", "THYROIDAB" in the last 72 hours. Anemia Panel: No results for input(s): "VITAMINB12", "FOLATE", "FERRITIN", "  TIBC", "IRON", "RETICCTPCT" in the last 72 hours. Urine analysis:    Component Value Date/Time   COLORURINE YELLOW 11/19/2022 1256   APPEARANCEUR HAZY (A) 11/19/2022 1256   LABSPEC 1.030 11/19/2022 1256   PHURINE 7.0 11/19/2022 1256   GLUCOSEU NEGATIVE 11/19/2022 1256   HGBUR NEGATIVE 11/19/2022 1256   BILIRUBINUR NEGATIVE 11/19/2022 1256   KETONESUR NEGATIVE 11/19/2022 1256   PROTEINUR 30 (A) 11/19/2022 1256   UROBILINOGEN 0.2 02/27/2015 1733   NITRITE NEGATIVE 11/19/2022 1256   LEUKOCYTESUR NEGATIVE 11/19/2022 1256   Sepsis Labs: @LABRCNTIP (procalcitonin:4,lacticidven:4) )No results found for this or any previous visit (from the past 240 hour(s)).   Radiological Exams on  Admission: CT ABDOMEN PELVIS WO CONTRAST  Result Date: 04/25/2023 CLINICAL DATA:  Abdominal pain and emesis. EXAM: CT ABDOMEN AND PELVIS WITHOUT CONTRAST TECHNIQUE: Multidetector CT imaging of the abdomen and pelvis was performed following the standard protocol without IV contrast. RADIATION DOSE REDUCTION: This exam was performed according to the departmental dose-optimization program which includes automated exposure control, adjustment of the mA and/or kV according to patient size and/or use of iterative reconstruction technique. COMPARISON:  11/18/2022. FINDINGS: Lower chest: The heart is borderline enlarged and there is a trace pericardial effusion. Mild atelectasis is present at the left lung base. Hepatobiliary: No focal liver abnormality is seen. No gallstones, gallbladder wall thickening, or biliary dilatation. Pancreas: Unremarkable. No pancreatic ductal dilatation or surrounding inflammatory changes. Spleen: Normal in size without focal abnormality. Adrenals/Urinary Tract: The adrenal glands are within normal limits. Nonobstructive renal calculus is noted on the right. No hydroureteronephrosis bilaterally. The bladder is unremarkable. Stomach/Bowel: There is a small hiatal hernia. Stomach is within normal limits. Appendix appears normal. No evidence of bowel wall thickening, distention, or inflammatory changes. No free air or pneumatosis. Vascular/Lymphatic: Aortic atherosclerosis. No enlarged abdominal or pelvic lymph nodes. Reproductive: Uterus and bilateral adnexa are unremarkable. Other: No abdominopelvic ascites. Increased density is noted in the subcutaneous tissues at the umbilicus. No focal fluid collection is seen. Musculoskeletal: Degenerative changes are present in the thoracolumbar spine. No acute osseous abnormality. IMPRESSION: 1. No acute intra-abdominal process. 2. Nonobstructive right renal calculus. 3. Small hiatal hernia. 4. Aortic atherosclerosis. 5. Increased density in the  subcutaneous tissues at the umbilicus, which may be infectious or inflammatory. Electronically Signed   By: Thornell Sartorius M.D.   On: 04/25/2023 02:42    EKG: Independently reviewed. Sinus rhythm.   Assessment/Plan   1. Intractable N/V  - Exam reassuring, no acute intraabdominal findings on CT  - Continue IVF hydration, electrolyte repletion, antiemetics, and advance diet as she improves   2. Hypokalemia  - Replacing, will repeat chem panel in am    3. Insulin-dependent DM  - A1c was 7.4% in April 2024  - Check CBGs and use long- and short-acting insulin   4. Chronic HFpEF  - Hold torsemide, monitor weight and I/Os    5. Hypertension  - Continue Coreg   6. Hx of VTE  - Continue Eliquis   7. Lupus; Sjogren syndrome  - Continue Plaquenil    8. Depression, anxiety  - Continue Abilify, Wellbutrin, Seroquel, and as-needed Xanax     DVT prophylaxis: Eliquis  Code Status: Full   Level of Care: Level of care: Telemetry Medical Family Communication: None present  Disposition Plan:  Patient is from: home  Anticipated d/c is to: Home  Anticipated d/c date is: 9/25 or 04/27/23  Patient currently: Pending electrolyte repletion and tolerance of adequate oral intake  Consults called:  None  Admission status: Observation     Briscoe Deutscher, MD [facility]  04/25/2023, 4:41 AM

## 2023-04-25 NOTE — ED Notes (Signed)
ED TO INPATIENT HANDOFF REPORT  ED Nurse Name and Phone #:  N'Dea [phone]  S Name/Age/Gender [name] 52 y.o. female Room/Bed: 001C/001C  Code Status   Code Status: Prior  Home/SNF/Other Home Patient oriented to: self, place, time, and situation Is this baseline? Yes   Triage Complete: Triage complete  Chief Complaint Intractable nausea and vomiting [R11.2]  Triage Note .   Patient BIB [facility] for N/V x2 weeks with Chest wall pain x 1 week when vomiting. Patient A&Ox4, uses walker at baseline reports 10/10 chest pain and abd pain, patient states she is unable to keep anything down x2 weeks including water.   Allergies Allergies  Allergen Reactions   Metoclopramide Other (See Comments)    Developed restless leg, akathisia type limb movements.    Codeine Itching and Rash   Hydrocodone Rash   Percocet [Oxycodone-Acetaminophen] Hives and Rash    Tolerates dilaudid Tolerates acetaminophen     Level of Care/Admitting Diagnosis ED Disposition     ED Disposition  Admit   Condition  --   Comment  Hospital Area: [facility] [[id number]]  Level of Care: Telemetry Medical [104]  May place patient in observation at [facility] or [facility] if equivalent level of care is available:: Yes  Covid Evaluation: Asymptomatic - no recent exposure (last 10 days) testing not required  Diagnosis: Intractable nausea and vomiting [720114]  Admitting Physician: Briscoe Deutscher [[id number]]  Attending Physician: Briscoe Deutscher [[id number]]          B Medical/Surgery History Past Medical History:  Diagnosis Date   Acute renal failure (HCC)     Intractable nausea vomiting secondary to diabetic gastroparesis causing dehydration and acute renal failure /notes 04/01/2013   Anginal pain (HCC)    Anxiety    Bell's palsy 08/09/2010   Bicornuate uterus    CAP (community acquired pneumonia) 10/2011   Hattie Perch 11/08/2011   Chest pain 01/13/2014   CHF (congestive heart  failure) (HCC)    Diabetic gastroparesis associated with type 2 diabetes mellitus (HCC)    this is presumed diagnoses, not confirmed by any studies.    Eczema    Family history of breast cancer    Family history of malignant neoplasm of breast    Family history of malignant neoplasm of ovary    Family history of ovarian cancer    Family history of prostate cancer    GERD (gastroesophageal reflux disease)    High cholesterol    Hypertension    Liver lesion 08/13/2019   Liver mass 2014   biopsied 03/2013 at [facility], not malignant.  is to undergo radiologic ablation of the mass in sept/October 2014.    Lupus (HCC)    Migraines    "maybe a couple times/yr" (04/01/2013)   Obesity    Obstructive sleep apnea on CPAP 2011   Oxygen at nights   Pulmonary embolism (HCC) 05/21/2021   SJOGREN'S SYNDROME 08/09/2010   SLE (systemic lupus erythematosus) (HCC)    Hattie Perch 12/01/2010   Past Surgical History:  Procedure Laterality Date   BREAST CYST EXCISION Left 08/2005   epidermoid   CARDIAC CATHETERIZATION  02/07/2012   ECTOPIC PREGNANCY SURGERY  1999   ESOPHAGOGASTRODUODENOSCOPY N/A 02/26/2013   Procedure: ESOPHAGOGASTRODUODENOSCOPY (EGD);  Surgeon: Hilarie Fredrickson, MD;  Location: [facility] ENDOSCOPY;  Service: Endoscopy;  Laterality: N/A;   ESOPHAGOGASTRODUODENOSCOPY N/A 03/02/2015   Procedure: ESOPHAGOGASTRODUODENOSCOPY (EGD);  Surgeon: Rachael Fee, MD;  Location: [facility] ENDOSCOPY;  Service: Endoscopy;  Laterality: N/A;   ESOPHAGOGASTRODUODENOSCOPY N/A 02/23/2021   Procedure: ESOPHAGOGASTRODUODENOSCOPY (EGD);  Surgeon: Charlott Rakes, MD;  Location: [facility] ENDOSCOPY;  Service: Endoscopy;  Laterality: N/A;  possible dilation   HERNIA REPAIR     LEFT AND RIGHT HEART CATHETERIZATION WITH CORONARY ANGIOGRAM N/A 02/07/2012   Procedure: LEFT AND RIGHT HEART CATHETERIZATION WITH CORONARY ANGIOGRAM;  Surgeon: Pamella Pert, MD;  Location: [facility] CATH LAB;  Service: Cardiovascular;  Laterality: N/A;    LIVER BIOPSY  03/2013   liver mass/medical hx noted above   MUSCLE BIOPSY     for lupus/notes 12/01/2010   UMBILICAL HERNIA REPAIR  1980's     A IV Location/Drains/Wounds Patient Lines/Drains/Airways Status     Active Line/Drains/Airways     Name Placement date Placement time Site Days   Peripheral IV 04/25/23 20 G 2.5" Anterior;Left Forearm 04/25/23  0108  Forearm  less than 1            Intake/Output Last 24 hours  Intake/Output Summary (Last 24 hours) at 04/25/2023 0435 Last data filed at 04/25/2023 0401 Gross per 24 hour  Intake 250 ml  Output --  Net 250 ml    Labs/Imaging Results for orders placed or performed during the hospital encounter of 04/24/23 (from the past 48 hour(s))  Comprehensive metabolic panel     Status: Abnormal   Collection Time: 04/24/23 10:49 PM  Result Value Ref Range   Sodium 139 135 - 145 mmol/L   Potassium 2.8 (L) 3.5 - 5.1 mmol/L   Chloride 105 98 - 111 mmol/L   CO2 25 22 - 32 mmol/L   Glucose, Bld 121 (H) 70 - 99 mg/dL    Comment: Glucose reference range applies only to samples taken after fasting for at least 8 hours.   BUN 7 6 - 20 mg/dL   Creatinine, Ser 5.36 0.44 - 1.00 mg/dL   Calcium 8.6 (L) 8.9 - 10.3 mg/dL   Total Protein 6.6 6.5 - 8.1 g/dL   Albumin 2.9 (L) 3.5 - 5.0 g/dL   AST 12 (L) 15 - 41 U/L   ALT 8 0 - 44 U/L   Alkaline Phosphatase 61 38 - 126 U/L   Total Bilirubin 1.1 0.3 - 1.2 mg/dL   GFR, Estimated >64 >40 mL/min    Comment: (NOTE) Calculated using the CKD-EPI Creatinine Equation (2021)    Anion gap 9 5 - 15    Comment: Performed at [facility], [address] [place], [place] [address]  Lipase, blood     Status: None   Collection Time: 04/24/23 10:49 PM  Result Value Ref Range   Lipase 24 11 - 51 U/L    Comment: Performed at [facility], [address] [place], [place] [address]  CBC     Status: Abnormal   Collection Time: 04/24/23 10:49 PM  Result Value Ref Range   WBC 9.7 4.0 - 10.5 K/uL    RBC 3.67 (L) 3.87 - 5.11 MIL/uL   Hemoglobin 10.2 (L) 12.0 - 15.0 g/dL   HCT 87.5 (L) 64.3 - 32.9 %   MCV 86.9 80.0 - 100.0 fL   MCH 27.8 26.0 - 34.0 pg   MCHC 32.0 30.0 - 36.0 g/dL   RDW 51.8 (H) 84.1 - 66.0 %   Platelets 215 150 - 400 K/uL   nRBC 0.0 0.0 - 0.2 %    Comment: Performed at [facility], [address] [place], [place] [address]  Magnesium  Status: None   Collection Time: 04/25/23  1:21 AM  Result Value Ref Range   Magnesium 1.7 1.7 - 2.4 mg/dL    Comment: Performed at [facility], [address], [place], [place] [address]   CT ABDOMEN PELVIS WO CONTRAST  Result Date: 04/25/2023 CLINICAL DATA:  Abdominal pain and emesis. EXAM: CT ABDOMEN AND PELVIS WITHOUT CONTRAST TECHNIQUE: Multidetector CT imaging of the abdomen and pelvis was performed following the standard protocol without IV contrast. RADIATION DOSE REDUCTION: This exam was performed according to the departmental dose-optimization program which includes automated exposure control, adjustment of the mA and/or kV according to patient size and/or use of iterative reconstruction technique. COMPARISON:  11/18/2022. FINDINGS: Lower chest: The heart is borderline enlarged and there is a trace pericardial effusion. Mild atelectasis is present at the left lung base. Hepatobiliary: No focal liver abnormality is seen. No gallstones, gallbladder wall thickening, or biliary dilatation. Pancreas: Unremarkable. No pancreatic ductal dilatation or surrounding inflammatory changes. Spleen: Normal in size without focal abnormality. Adrenals/Urinary Tract: The adrenal glands are within normal limits. Nonobstructive renal calculus is noted on the right. No hydroureteronephrosis bilaterally. The bladder is unremarkable. Stomach/Bowel: There is a small hiatal hernia. Stomach is within normal limits. Appendix appears normal. No evidence of bowel wall thickening, distention, or inflammatory changes. No free air or pneumatosis.  Vascular/Lymphatic: Aortic atherosclerosis. No enlarged abdominal or pelvic lymph nodes. Reproductive: Uterus and bilateral adnexa are unremarkable. Other: No abdominopelvic ascites. Increased density is noted in the subcutaneous tissues at the umbilicus. No focal fluid collection is seen. Musculoskeletal: Degenerative changes are present in the thoracolumbar spine. No acute osseous abnormality. IMPRESSION: 1. No acute intra-abdominal process. 2. Nonobstructive right renal calculus. 3. Small hiatal hernia. 4. Aortic atherosclerosis. 5. Increased density in the subcutaneous tissues at the umbilicus, which may be infectious or inflammatory. Electronically Signed   By: Thornell Sartorius M.D.   On: 04/25/2023 02:42    Pending Labs Unresulted Labs (From admission, onward)     Start     Ordered   04/24/23 1936  Urinalysis, Routine w reflex microscopic -Urine, Clean Catch  Once,   URGENT       Question:  Specimen Source  Answer:  Urine, Clean Catch   04/24/23 1936   04/24/23 1936  Rapid urine drug screen (hospital performed)  Add-on,   AD        04/24/23 1936            Vitals/Pain Today's Vitals   04/25/23 0250 04/25/23 0300 04/25/23 0315 04/25/23 0330  BP:  (!) 142/65 129/70 (!) 121/59  Pulse: 69 88 75 92  Resp: 15 16 16 14   Temp:      TempSrc:      SpO2: 99% 100% 97% 97%  PainSc:        Isolation Precautions No active isolations  Medications Medications  dicyclomine (BENTYL) injection 20 mg (0 mg Intramuscular Hold 04/24/23 2240)  potassium chloride 10 mEq in 100 mL IVPB (10 mEq Intravenous New Bag/Given 04/25/23 0402)  ondansetron (ZOFRAN) injection 4 mg (4 mg Intravenous Given 04/24/23 2242)  sodium chloride 0.9 % bolus 1,000 mL (1,000 mLs Intravenous New Bag/Given 04/24/23 2248)  droperidol (INAPSINE) 2.5 MG/ML injection 2.5 mg (2.5 mg Intravenous Given 04/24/23 2240)  diphenhydrAMINE (BENADRYL) injection 50 mg (50 mg Intravenous Given 04/24/23 2240)  promethazine (PHENERGAN) 12.5 mg in  sodium chloride 0.9 % 50 mL IVPB (0 mg Intravenous Stopped 04/25/23 0319)  ketorolac (TORADOL) 15 MG/ML injection 15  mg (15 mg Intravenous Given 04/25/23 0317)    Mobility Walks with walker     Focused Assessments N/V management, improve PO intake, patient refusing Po fluid challenge at this time   R Recommendations: See Admitting Provider Note  Report given to:   Additional Notes:

## 2023-04-25 NOTE — Progress Notes (Signed)
Patient admitted early this morning for intractable nausea and vomiting and has been started on IV fluids and antiemetics.  Patient seen and examined at bedside and directly discussed with her.  I have reviewed patient's medical records including this morning's H&P, current vitals, labs and medications myself.  Continue IV fluids.  Repeat a.m. labs.  Currently on clear liquid diet.

## 2023-04-25 NOTE — ED Notes (Signed)
Pt requesting something for pain at this time.  Provider notified of same.

## 2023-04-26 DIAGNOSIS — E440 Moderate protein-calorie malnutrition: Secondary | ICD-10-CM | POA: Diagnosis present

## 2023-04-26 DIAGNOSIS — Z87891 Personal history of nicotine dependence: Secondary | ICD-10-CM | POA: Diagnosis not present

## 2023-04-26 DIAGNOSIS — Z79890 Hormone replacement therapy (postmenopausal): Secondary | ICD-10-CM | POA: Diagnosis not present

## 2023-04-26 DIAGNOSIS — I5032 Chronic diastolic (congestive) heart failure: Secondary | ICD-10-CM | POA: Diagnosis present

## 2023-04-26 DIAGNOSIS — B961 Klebsiella pneumoniae [K. pneumoniae] as the cause of diseases classified elsewhere: Secondary | ICD-10-CM | POA: Diagnosis present

## 2023-04-26 DIAGNOSIS — E1169 Type 2 diabetes mellitus with other specified complication: Secondary | ICD-10-CM | POA: Diagnosis present

## 2023-04-26 DIAGNOSIS — E861 Hypovolemia: Secondary | ICD-10-CM | POA: Diagnosis not present

## 2023-04-26 DIAGNOSIS — G8929 Other chronic pain: Secondary | ICD-10-CM | POA: Diagnosis present

## 2023-04-26 DIAGNOSIS — N179 Acute kidney failure, unspecified: Secondary | ICD-10-CM | POA: Diagnosis not present

## 2023-04-26 DIAGNOSIS — F419 Anxiety disorder, unspecified: Secondary | ICD-10-CM | POA: Diagnosis present

## 2023-04-26 DIAGNOSIS — M329 Systemic lupus erythematosus, unspecified: Secondary | ICD-10-CM | POA: Diagnosis present

## 2023-04-26 DIAGNOSIS — R42 Dizziness and giddiness: Secondary | ICD-10-CM | POA: Diagnosis not present

## 2023-04-26 DIAGNOSIS — R109 Unspecified abdominal pain: Secondary | ICD-10-CM | POA: Diagnosis present

## 2023-04-26 DIAGNOSIS — E039 Hypothyroidism, unspecified: Secondary | ICD-10-CM | POA: Diagnosis present

## 2023-04-26 DIAGNOSIS — I10 Essential (primary) hypertension: Secondary | ICD-10-CM | POA: Diagnosis not present

## 2023-04-26 DIAGNOSIS — R112 Nausea with vomiting, unspecified: Secondary | ICD-10-CM | POA: Diagnosis not present

## 2023-04-26 DIAGNOSIS — Z6841 Body Mass Index (BMI) 40.0 and over, adult: Secondary | ICD-10-CM | POA: Diagnosis not present

## 2023-04-26 DIAGNOSIS — I110 Hypertensive heart disease with heart failure: Secondary | ICD-10-CM | POA: Diagnosis present

## 2023-04-26 DIAGNOSIS — E1143 Type 2 diabetes mellitus with diabetic autonomic (poly)neuropathy: Secondary | ICD-10-CM | POA: Diagnosis present

## 2023-04-26 DIAGNOSIS — D638 Anemia in other chronic diseases classified elsewhere: Secondary | ICD-10-CM | POA: Diagnosis present

## 2023-04-26 DIAGNOSIS — E876 Hypokalemia: Secondary | ICD-10-CM | POA: Diagnosis present

## 2023-04-26 DIAGNOSIS — N390 Urinary tract infection, site not specified: Secondary | ICD-10-CM | POA: Diagnosis present

## 2023-04-26 DIAGNOSIS — F32A Depression, unspecified: Secondary | ICD-10-CM | POA: Diagnosis present

## 2023-04-26 DIAGNOSIS — Z1611 Resistance to penicillins: Secondary | ICD-10-CM | POA: Diagnosis present

## 2023-04-26 DIAGNOSIS — E7800 Pure hypercholesterolemia, unspecified: Secondary | ICD-10-CM | POA: Diagnosis present

## 2023-04-26 DIAGNOSIS — Z794 Long term (current) use of insulin: Secondary | ICD-10-CM | POA: Diagnosis not present

## 2023-04-26 DIAGNOSIS — M3500 Sicca syndrome, unspecified: Secondary | ICD-10-CM | POA: Diagnosis present

## 2023-04-26 LAB — GLUCOSE, CAPILLARY
Glucose-Capillary: 82 mg/dL (ref 70–99)
Glucose-Capillary: 82 mg/dL (ref 70–99)
Glucose-Capillary: 95 mg/dL (ref 70–99)
Glucose-Capillary: 163 mg/dL — ABNORMAL HIGH (ref 70–99)
Glucose-Capillary: 141 mg/dL — ABNORMAL HIGH (ref 70–99)

## 2023-04-26 LAB — BASIC METABOLIC PANEL
Anion gap: 8 (ref 5–15)
BUN: 7 mg/dL (ref 6–20)
CO2: 22 mmol/L (ref 22–32)
Calcium: 8.5 mg/dL — ABNORMAL LOW (ref 8.9–10.3)
Chloride: 107 mmol/L (ref 98–111)
Creatinine, Ser: 0.84 mg/dL (ref 0.44–1.00)
GFR, Estimated: >60 mL/min (ref >60)
Glucose, Bld: 81 mg/dL (ref 70–99)
Potassium: 4 mmol/L (ref 3.5–5.1)
Sodium: 137 mmol/L (ref 135–145)

## 2023-04-26 LAB — CBC
HCT: 30.5 % — ABNORMAL LOW (ref 36.0–46.0)
Hemoglobin: 9.5 g/dL — ABNORMAL LOW (ref 12.0–15.0)
MCH: 27.1 pg (ref 26.0–34.0)
MCHC: 31.1 g/dL (ref 30.0–36.0)
MCV: 87.1 fL (ref 80.0–100.0)
Platelets: 198 10*3/uL (ref 150–400)
RBC: 3.5 MIL/uL — ABNORMAL LOW (ref 3.87–5.11)
RDW: 16.4 % — ABNORMAL HIGH (ref 11.5–15.5)
WBC: 8 10*3/uL (ref 4.0–10.5)
nRBC: 0 % (ref 0.0–0.2)

## 2023-04-26 MED ORDER — METOCLOPRAMIDE HCL 5 MG/ML IJ SOLN
10.0000 mg | Freq: Once | INTRAMUSCULAR | Status: AC
  Administered 2023-04-26: 10 mg via INTRAVENOUS
  Filled 2023-04-26: qty 2

## 2023-04-26 MED ORDER — DIPHENHYDRAMINE HCL 50 MG/ML IJ SOLN
12.5000 mg | Freq: Once | INTRAMUSCULAR | Status: AC
  Administered 2023-04-26: 12.5 mg via INTRAVENOUS
  Filled 2023-04-26: qty 1

## 2023-04-26 MED ORDER — INSULIN ASPART 100 UNIT/ML IJ SOLN
0.0000 [IU] | Freq: Every day | INTRAMUSCULAR | Status: DC
  Administered 2023-05-02: 2 [IU] via SUBCUTANEOUS

## 2023-04-26 MED ORDER — BOOST / RESOURCE BREEZE PO LIQD CUSTOM
1.0000 | Freq: Three times a day (TID) | ORAL | Status: DC
  Administered 2023-04-26 (×3): 1 via ORAL

## 2023-04-26 MED ORDER — INSULIN ASPART 100 UNIT/ML IJ SOLN
0.0000 [IU] | Freq: Three times a day (TID) | INTRAMUSCULAR | Status: DC
  Administered 2023-04-26 – 2023-04-28 (×3): 3 [IU] via SUBCUTANEOUS
  Administered 2023-04-29: 2 [IU] via SUBCUTANEOUS
  Administered 2023-04-29 – 2023-04-30 (×3): 3 [IU] via SUBCUTANEOUS
  Administered 2023-04-30 (×2): 2 [IU] via SUBCUTANEOUS
  Administered 2023-05-01: 15 [IU] via SUBCUTANEOUS
  Administered 2023-05-01 – 2023-05-02 (×4): 3 [IU] via SUBCUTANEOUS
  Administered 2023-05-02 – 2023-05-03 (×2): 2 [IU] via SUBCUTANEOUS

## 2023-04-26 MED ORDER — SODIUM CHLORIDE 0.9 % IV SOLN: INTRAVENOUS | Status: AC

## 2023-04-26 NOTE — Progress Notes (Signed)
Patient vomited all of her HS medications. Pills seen in 450 cc of dark green emesis.

## 2023-04-26 NOTE — Progress Notes (Signed)
[facility] PROGRESS NOTE   [name] NWG:[id number] DOB: August 30, 1970 DOA: 04/24/2023  PCP: Verlon Au, MD  Brief History: 52 y.o. female with medical history significant for insulin-dependent diabetes mellitus, chronic HFpEF, history of DVT on Eliquis, hypothyroidism, depression, anxiety, lupus, Sjogren syndrome, and BMI 55 who presented to the emergency department with upper abdominal pain, nausea, and vomiting.  According to the patient's symptoms have been ongoing for about 2 weeks.  She underwent CT scan of the abdomen and pelvis which did not show any acute findings.  She was hospitalized for further management.  Of note she has had a normal gastric emptying study in 2021.  Had an EGD in 2022 which revealed gastritis.  Consultants: None  Procedures: None yet    Subjective/Interval History: Continues to have nausea.  Had an episode of vomiting overnight.  Complains of upper abdominal pain.    Assessment/Plan:  Intractable nausea and vomiting CT was reassuring.  LFTs were normal.  Lipase was normal.  She does have diabetes so diabetic gastroparesis is likely although she did have a negative gastric emptying study in 2021.  Viral illness is also a possibility.  Continue with symptomatic treatment.  Continue with IV fluids.  She is allergic to metoclopramide.  Hypokalemia Repleted.  Magnesium 1.7.  Insulin-dependent diabetes mellitus HbA1c was 7.4 in April 2024.  Continue with SSI.  Low glucose levels noted this morning.  Hold long-acting insulin for now.  Chronic diastolic CHF Holding diuretics for now due to hypovolemic status.  Normocytic anemia Likely due to chronic disease.  No evidence for overt bleeding.  History of DVT Continue Eliquis.  History of lupus and Sjogren's syndrome Continue Plaquenil.  History of depression and anxiety Continue with her home medications.  Hypothyroidism Continue levothyroxine  Obesity Estimated body mass  index is 54.87 kg/m as calculated from the following:   Height as of 10/25/22: 5\' 8"  (1.727 m).   Weight as of 11/19/22: 163.7 kg.   DVT Prophylaxis: On apixaban Code Status: Full code Family Communication: Discussed with patient Disposition Plan: Hopefully return home when improved    Medications: Scheduled:  apixaban  2.5 mg Oral BID   ARIPiprazole  5 mg Oral Daily   buPROPion  300 mg Oral Daily   carvedilol  37.5 mg Oral BID WC   feeding supplement  1 Container Oral TID BM   hydroxychloroquine  200 mg Oral BID   insulin aspart  0-15 Units Subcutaneous TID WC   insulin aspart  0-5 Units Subcutaneous QHS   isosorbide-hydrALAZINE  1 tablet Oral TID   levothyroxine  50 mcg Oral Q0600   pantoprazole (PROTONIX) IV  40 mg Intravenous Q24H   pregabalin  75 mg Oral TID   QUEtiapine  300 mg Oral QHS   rosuvastatin  20 mg Oral Daily   sodium chloride flush  3 mL Intravenous Q12H   topiramate  75 mg Oral QHS   Continuous:  sodium chloride 75 mL/hr at 04/26/23 1027   VHQ:[id number] **OR** acetaminophen, albuterol, ALPRAZolam, HYDROmorphone (DILAUDID) injection, ondansetron **OR** ondansetron (ZOFRAN) IV, prochlorperazine  Antibiotics: Anti-infectives (From admission, onward)    Start     Dose/Rate Route Frequency Ordered Stop   04/25/23 1000  hydroxychloroquine (PLAQUENIL) tablet 200 mg        200 mg Oral 2 times daily 04/25/23 0441         Objective:  Vital Signs  Vitals:   04/25/23 1617 04/25/23 2008 04/26/23 0548 04/26/23 0853  BP: Marland Kitchen)  99/59 118/63 123/65 121/69  Pulse: 73 68 62 76  Resp: 18 16 18 18   Temp: 97.7 F (36.5 C) 98 F (36.7 C) 97.6 F (36.4 C) 98.4 F (36.9 C)  TempSrc:  Oral Oral   SpO2: 100% 98% 100% 97%    Intake/Output Summary (Last 24 hours) at 04/26/2023 1022 Last data filed at 04/26/2023 0900 Gross per 24 hour  Intake 2861.56 ml  Output 1050 ml  Net 1811.56 ml   There were no vitals filed for this visit.  General appearance: Awake  alert.  In no distress Resp: Clear to auscultation bilaterally.  Normal effort Cardio: S1-S2 is normal regular.  No S3-S4.  No rubs murmurs or bruit GI: Abdomen is soft.  Tender in the epigastrium.  No rebound rigidity or guarding.  No masses organomegaly. Extremities: No edema.  Full range of motion of lower extremities. Neurologic: Alert and oriented x3.  No focal neurological deficits.    Lab Results:  Data Reviewed: I have personally reviewed following labs and reports of the imaging studies  CBC: Recent Labs  Lab 04/24/23 2249 04/25/23 0713 04/26/23 0431  WBC 9.7 9.1 8.0  HGB 10.2* 9.9* 9.5*  HCT 31.9* 30.8* 30.5*  MCV 86.9 88.0 87.1  PLT 215 208 198    Basic Metabolic Panel: Recent Labs  Lab 04/24/23 2249 04/25/23 0121 04/25/23 1004 04/26/23 0431  NA 139  --  136 137  K 2.8*  --  3.2* 4.0  CL 105  --  103 107  CO2 25  --  21* 22  GLUCOSE 121*  --  135* 81  BUN 7  --  8 7  CREATININE 0.95  --  0.84 0.84  CALCIUM 8.6*  --  8.3* 8.5*  MG  --  1.7 1.7  --     GFR: CrCl cannot be calculated (Unknown ideal weight.).  Liver Function Tests: Recent Labs  Lab 04/24/23 2249  AST 12*  ALT 8  ALKPHOS 61  BILITOT 1.1  PROT 6.6  ALBUMIN 2.9*    Recent Labs  Lab 04/24/23 2249  LIPASE 24    HbA1C: Recent Labs    04/25/23 0713  HGBA1C 7.3*    CBG: Recent Labs  Lab 04/25/23 1054 04/25/23 1618 04/25/23 2007 04/26/23 0549 04/26/23 0737  GLUCAP 102* 165* 113* 82 82    Radiology Studies: CT ABDOMEN PELVIS WO CONTRAST  Result Date: 04/25/2023 CLINICAL DATA:  Abdominal pain and emesis. EXAM: CT ABDOMEN AND PELVIS WITHOUT CONTRAST TECHNIQUE: Multidetector CT imaging of the abdomen and pelvis was performed following the standard protocol without IV contrast. RADIATION DOSE REDUCTION: This exam was performed according to the departmental dose-optimization program which includes automated exposure control, adjustment of the mA and/or kV according to  patient size and/or use of iterative reconstruction technique. COMPARISON:  11/18/2022. FINDINGS: Lower chest: The heart is borderline enlarged and there is a trace pericardial effusion. Mild atelectasis is present at the left lung base. Hepatobiliary: No focal liver abnormality is seen. No gallstones, gallbladder wall thickening, or biliary dilatation. Pancreas: Unremarkable. No pancreatic ductal dilatation or surrounding inflammatory changes. Spleen: Normal in size without focal abnormality. Adrenals/Urinary Tract: The adrenal glands are within normal limits. Nonobstructive renal calculus is noted on the right. No hydroureteronephrosis bilaterally. The bladder is unremarkable. Stomach/Bowel: There is a small hiatal hernia. Stomach is within normal limits. Appendix appears normal. No evidence of bowel wall thickening, distention, or inflammatory changes. No free air or pneumatosis. Vascular/Lymphatic: Aortic atherosclerosis. No enlarged abdominal  or pelvic lymph nodes. Reproductive: Uterus and bilateral adnexa are unremarkable. Other: No abdominopelvic ascites. Increased density is noted in the subcutaneous tissues at the umbilicus. No focal fluid collection is seen. Musculoskeletal: Degenerative changes are present in the thoracolumbar spine. No acute osseous abnormality. IMPRESSION: 1. No acute intra-abdominal process. 2. Nonobstructive right renal calculus. 3. Small hiatal hernia. 4. Aortic atherosclerosis. 5. Increased density in the subcutaneous tissues at the umbilicus, which may be infectious or inflammatory. Electronically Signed   By: Thornell Sartorius M.D.   On: 04/25/2023 02:42       LOS: 0 days   Jasani Lengel Rito Ehrlich  [facility] Pager on [email or web address]  04/26/2023, 10:22 AM

## 2023-04-26 NOTE — Plan of Care (Signed)
  Problem: Education: Goal: Ability to describe self-care measures that may prevent or decrease complications (Diabetes Survival Skills Education) will improve Outcome: Progressing   Problem: Health Behavior/Discharge Planning: Goal: Ability to identify and utilize available resources and services will improve Outcome: Progressing   Problem: Metabolic: Goal: Ability to maintain appropriate glucose levels will improve Outcome: Progressing   Problem: Education: Goal: Knowledge of General Education information will improve Description: Including pain rating scale, medication(s)/side effects and non-pharmacologic comfort measures Outcome: Progressing   Problem: Clinical Measurements: Goal: Will remain free from infection Outcome: Progressing Goal: Diagnostic test results will improve Outcome: Progressing

## 2023-04-27 DIAGNOSIS — F32A Depression, unspecified: Secondary | ICD-10-CM | POA: Diagnosis not present

## 2023-04-27 DIAGNOSIS — F419 Anxiety disorder, unspecified: Secondary | ICD-10-CM | POA: Diagnosis not present

## 2023-04-27 DIAGNOSIS — I10 Essential (primary) hypertension: Secondary | ICD-10-CM | POA: Diagnosis not present

## 2023-04-27 DIAGNOSIS — E440 Moderate protein-calorie malnutrition: Secondary | ICD-10-CM | POA: Insufficient documentation

## 2023-04-27 DIAGNOSIS — R112 Nausea with vomiting, unspecified: Secondary | ICD-10-CM | POA: Diagnosis not present

## 2023-04-27 LAB — BASIC METABOLIC PANEL
Anion gap: 5 (ref 5–15)
BUN: 5 mg/dL — ABNORMAL LOW (ref 6–20)
CO2: 22 mmol/L (ref 22–32)
Calcium: 8.2 mg/dL — ABNORMAL LOW (ref 8.9–10.3)
Chloride: 109 mmol/L (ref 98–111)
Creatinine, Ser: 0.91 mg/dL (ref 0.44–1.00)
GFR, Estimated: >60 mL/min (ref >60)
Glucose, Bld: 104 mg/dL — ABNORMAL HIGH (ref 70–99)
Potassium: 3.4 mmol/L — ABNORMAL LOW (ref 3.5–5.1)
Sodium: 136 mmol/L (ref 135–145)

## 2023-04-27 LAB — GLUCOSE, CAPILLARY
Glucose-Capillary: 93 mg/dL (ref 70–99)
Glucose-Capillary: 107 mg/dL — ABNORMAL HIGH (ref 70–99)
Glucose-Capillary: 152 mg/dL — ABNORMAL HIGH (ref 70–99)
Glucose-Capillary: 119 mg/dL — ABNORMAL HIGH (ref 70–99)

## 2023-04-27 LAB — CBC
HCT: 31.1 % — ABNORMAL LOW (ref 36.0–46.0)
Hemoglobin: 9.6 g/dL — ABNORMAL LOW (ref 12.0–15.0)
MCH: 26.8 pg (ref 26.0–34.0)
MCHC: 30.9 g/dL (ref 30.0–36.0)
MCV: 86.9 fL (ref 80.0–100.0)
Platelets: 172 10*3/uL (ref 150–400)
RBC: 3.58 MIL/uL — ABNORMAL LOW (ref 3.87–5.11)
RDW: 16.2 % — ABNORMAL HIGH (ref 11.5–15.5)
WBC: 8.4 10*3/uL (ref 4.0–10.5)
nRBC: 0 % (ref 0.0–0.2)

## 2023-04-27 LAB — MAGNESIUM: Magnesium: 1.8 mg/dL (ref 1.7–2.4)

## 2023-04-27 MED ORDER — PANTOPRAZOLE SODIUM 40 MG IV SOLR
40.0000 mg | Freq: Two times a day (BID) | INTRAVENOUS | Status: DC
  Administered 2023-04-27 – 2023-05-01 (×9): 40 mg via INTRAVENOUS
  Filled 2023-04-27 (×9): qty 10

## 2023-04-27 MED ORDER — METOCLOPRAMIDE HCL 5 MG/ML IJ SOLN
10.0000 mg | Freq: Three times a day (TID) | INTRAMUSCULAR | Status: AC
  Administered 2023-04-27 – 2023-04-29 (×8): 10 mg via INTRAVENOUS
  Filled 2023-04-27 (×8): qty 2

## 2023-04-27 MED ORDER — BOOST / RESOURCE BREEZE PO LIQD CUSTOM
1.0000 | Freq: Four times a day (QID) | ORAL | Status: DC
  Administered 2023-04-27 – 2023-05-03 (×21): 1 via ORAL
  Filled 2023-04-27: qty 1

## 2023-04-27 MED ORDER — POTASSIUM CHLORIDE 10 MEQ/100ML IV SOLN
10.0000 meq | INTRAVENOUS | Status: AC
  Administered 2023-04-27 (×4): 10 meq via INTRAVENOUS
  Filled 2023-04-27 (×3): qty 100

## 2023-04-27 MED ORDER — SUCRALFATE 1 GM/10ML PO SUSP
1.0000 g | Freq: Three times a day (TID) | ORAL | Status: DC
  Administered 2023-04-27 – 2023-05-03 (×22): 1 g via ORAL
  Filled 2023-04-27 (×23): qty 10

## 2023-04-27 MED ORDER — DIPHENHYDRAMINE HCL 50 MG/ML IJ SOLN
12.5000 mg | Freq: Three times a day (TID) | INTRAMUSCULAR | Status: AC
  Administered 2023-04-27 – 2023-04-29 (×8): 12.5 mg via INTRAVENOUS
  Filled 2023-04-27 (×6): qty 1

## 2023-04-27 NOTE — Evaluation (Signed)
Physical Therapy Evaluation Patient Details Name: [name] MRN: [id number] DOB: [birth year] Today's Date: 04/27/2023  History of Present Illness  Pt is 52 year old presented to [facility] on  04/24/23 for abdominal pain and intractable nausea and vomiting. CT without acute findings. PMH - DM, lupus, morbid obesity  systolic/diastolic CHF, chronic right ventricular failure, history of DVT, hypothyroidism, depression and anxiety and chronic abdominal pain.  Clinical Impression  Pt admitted with above diagnosis and presents to PT with functional limitations due to deficits listed below (See PT problem list). Pt needs skilled PT to maximize independence and safety. Pt limited by nausea and likely not too far from her baseline.           If plan is discharge home, recommend the following: Assistance with cooking/housework   Can travel by private vehicle        Equipment Recommendations None recommended by PT  Recommendations for Other Services       Functional Status Assessment Patient has had a recent decline in their functional status and demonstrates the ability to make significant improvements in function in a reasonable and predictable amount of time.     Precautions / Restrictions Precautions Precautions: None Restrictions Weight Bearing Restrictions: No      Mobility  Bed Mobility               General bed mobility comments: Pt up on BSC    Transfers Overall transfer level: Needs assistance Equipment used: Rollator (4 wheels), None Transfers: Sit to/from Stand, Bed to chair/wheelchair/BSC Sit to Stand: Supervision Stand pivot transfers: Supervision              Ambulation/Gait Ambulation/Gait assistance: Contact guard assist Gait Distance (Feet): 25 Feet Assistive device: Rollator (4 wheels) Gait Pattern/deviations: Step-through pattern, Decreased stride length, Wide base of support Gait velocity: Decr Gait velocity interpretation: 1.31 - 2.62 ft/sec,  indicative of limited community ambulator   General Gait Details: Assist for safety. Pt felt nausous  Stairs            Wheelchair Mobility     Tilt Bed    Modified Rankin (Stroke Patients Only)       Balance Overall balance assessment: Needs assistance Sitting-balance support: Feet supported, No upper extremity supported Sitting balance-Leahy Scale: Good     Standing balance support: No upper extremity supported Standing balance-Leahy Scale: Fair                               Pertinent Vitals/Pain Pain Assessment Pain Assessment: 0-10 Pain Score: 10-Worst pain ever Pain Location: abdomen Pain Descriptors / Indicators: Sharp, Aching, Grimacing, Guarding Pain Intervention(s): Monitored during session, Premedicated before session, Limited activity within patient's tolerance    Home Living Family/patient expects to be discharged to:: Private residence Living Arrangements: Alone Available Help at Discharge: Family;Available PRN/intermittently;Personal care attendant Type of Home: House Home Access: Stairs to enter Entrance Stairs-Rails: None;Right;Left Entrance Stairs-Number of Steps: 7   Home Layout: One level Home Equipment: Agricultural consultant (2 wheels);Rollator (4 wheels);Wheelchair - manual;BSC/3in1;Tub bench Additional Comments: Aide 7 days/wk, 2.5 hours/day    Prior Function Prior Level of Function : Needs assist       Physical Assist : ADLs (physical)   ADLs (physical): Bathing;Dressing;IADLs Mobility Comments: limited household ambulator with Rollator ADLs Comments: aide assists with ADL's/IADL's, able to manage toileting without assist but aide assists with most other ADLs.  Her sister manages meals and  assists as needed when aide is not present.  Aide sets up pill box weekly.     Extremity/Trunk Assessment   Upper Extremity Assessment Upper Extremity Assessment: Overall WFL for tasks assessed    Lower Extremity Assessment Lower  Extremity Assessment: Generalized weakness       Communication   Communication Communication: No apparent difficulties Cueing Techniques: Verbal cues  Cognition Arousal: Alert Behavior During Therapy: WFL for tasks assessed/performed Overall Cognitive Status: Within Functional Limits for tasks assessed                                          General Comments General comments (skin integrity, edema, etc.): VSS on RA, light headedness during transfer after receiving IV pain meds    Exercises     Assessment/Plan    PT Assessment Patient needs continued PT services  PT Problem List Decreased strength;Decreased balance;Decreased mobility;Decreased activity tolerance       PT Treatment Interventions DME instruction;Gait training;Stair training;Therapeutic exercise;Therapeutic activities;Functional mobility training;Balance training;Patient/family education    PT Goals (Current goals can be found in the Care Plan section)  Acute Rehab PT Goals PT Goal Formulation: With patient Time For Goal Achievement: 05/11/23 Potential to Achieve Goals: Good    Frequency Min 1X/week     Co-evaluation               AM-PAC PT "6 Clicks" Mobility  Outcome Measure Help needed turning from your back to your side while in a flat bed without using bedrails?: None Help needed moving from lying on your back to sitting on the side of a flat bed without using bedrails?: A Little Help needed moving to and from a bed to a chair (including a wheelchair)?: A Little Help needed standing up from a chair using your arms (e.g., wheelchair or bedside chair)?: A Little Help needed to walk in hospital room?: A Little Help needed climbing 3-5 steps with a railing? : A Little 6 Click Score: 19    End of Session   Activity Tolerance: Other (comment) (limited by nausea) Patient left: in bed;with call bell/phone within reach   PT Visit Diagnosis: Other abnormalities of gait and  mobility (R26.89);Muscle weakness (generalized) (M62.81)    Time: 9563-8756 PT Time Calculation (min) (ACUTE ONLY): 10 min   Charges:   PT Evaluation $PT Eval Moderate Complexity: 1 Mod   PT General Charges $$ ACUTE PT VISIT: 1 Visit         [facility] PT Acute Rehabilitation Services Office [phone]   Angelina Ok [facility] 04/27/2023, 10:42 AM

## 2023-04-27 NOTE — Progress Notes (Signed)
   04/27/23 1600  Spiritual Encounters  Type of Visit Initial  Care provided to: Patient  Conversation partners present during encounter Nurse  Referral source Patient request  Reason for visit Advance directives  OnCall Visit No   Ch responded to request for AD. There was no family present at bedside. Ch assisted pt with AD education. Pt will page Ch when ready.   Chaplain Val Shakur Lembo, M.Div.

## 2023-04-27 NOTE — Evaluation (Signed)
Occupational Therapy Evaluation Patient Details Name: [name] MRN: [id number] DOB: 18-Feb-1971 Today's Date: 04/27/2023   History of Present Illness Pt is 52 year old presented to [facility] on  04/24/23 for abdominal pain and intractable nausea and vomiting. CT without acute findings. PMH - DM, lupus, morbid obesity  systolic/diastolic CHF, chronic right ventricular failure, history of DVT, hypothyroidism, depression and anxiety and chronic abdominal pain.   Clinical Impression   Pt admitted for above, presents with abdominal pain which limits her tolerance for functional activities. Pt completing pivots with CGA, demonstrated ability to perform reaching to 2/3 of her bilat shins without intrusive pain. Pt would benefit from continued acute skilled OT services to maintain functional capabilities while in acute setting and introduce AE prn to promote independence and reduce caregiver burden of care. Patient would benefit from post acute Home OT services to help maximize functional independence in natural environment        If plan is discharge home, recommend the following: A little help with bathing/dressing/bathroom;Assistance with cooking/housework    Functional Status Assessment  Patient has had a recent decline in their functional status and demonstrates the ability to make significant improvements in function in a reasonable and predictable amount of time.  Equipment Recommendations  None recommended by OT (Pt has rec DME)    Recommendations for Other Services       Precautions / Restrictions Restrictions Weight Bearing Restrictions: No      Mobility Bed Mobility Overal bed mobility: Needs Assistance Bed Mobility: Supine to Sit     Supine to sit: Supervision     General bed mobility comments: Pt left on BSC in care of the PT    Transfers Overall transfer level: Needs assistance Equipment used:  (bed rail UE support) Transfers: Sit to/from Stand Sit to Stand: Contact  guard assist                  Balance Overall balance assessment: Needs assistance Sitting-balance support: Feet supported, No upper extremity supported Sitting balance-Leahy Scale: Good     Standing balance support: Single extremity supported Standing balance-Leahy Scale: Fair Standing balance comment: stands for clothing management without UE support                           ADL either performed or assessed with clinical judgement   ADL Overall ADL's : Needs assistance/impaired Eating/Feeding: Independent;Sitting   Grooming: Set up;Sitting   Upper Body Bathing: Sitting;Modified independent   Lower Body Bathing: Sitting/lateral leans;Minimal assistance Lower Body Bathing Details (indicate cue type and reason): simulated Upper Body Dressing : Sitting;Set up   Lower Body Dressing: Sitting/lateral leans;Maximal assistance Lower Body Dressing Details (indicate cue type and reason): has assist at baseline Toilet Transfer: Stand-pivot;Contact guard assist;BSC/3in1   Toileting- Clothing Manipulation and Hygiene: Sit to/from stand;Contact guard assist               Vision         Perception         Praxis         Pertinent Vitals/Pain Pain Assessment Pain Assessment: 0-10 Pain Score: 10-Worst pain ever Pain Location: abdomen Pain Descriptors / Indicators: Sharp, Aching, Grimacing, Guarding Pain Intervention(s): Monitored during session, RN gave pain meds during session, Limited activity within patient's tolerance, Repositioned     Extremity/Trunk Assessment Upper Extremity Assessment Upper Extremity Assessment: Overall WFL for tasks assessed   Lower Extremity Assessment Lower Extremity Assessment: Defer  to PT evaluation       Communication Communication Communication: No apparent difficulties Cueing Techniques: Verbal cues   Cognition Arousal: Alert Behavior During Therapy: WFL for tasks assessed/performed Overall Cognitive Status:  Within Functional Limits for tasks assessed                                       General Comments  VSS on RA, light headedness during transfer after receiving IV pain meds    Exercises     Shoulder Instructions      Home Living Family/patient expects to be discharged to:: Private residence Living Arrangements: Alone Available Help at Discharge: Family;Available PRN/intermittently;Personal care attendant Type of Home: House Home Access: Stairs to enter [employer] of Steps: 7   Home Layout: One level     Bathroom Shower/Tub: Chief Strategy Officer: Standard Bathroom Accessibility: Yes   Home Equipment: Agricultural consultant (2 wheels);Rollator (4 wheels);Wheelchair - manual;BSC/3in1;Tub bench   Additional Comments: Aide 7 days/wk, 2.5 hours/day      Prior Functioning/Environment Prior Level of Function : Needs assist       Physical Assist : ADLs (physical)   ADLs (physical): Bathing;Dressing;IADLs Mobility Comments: limited household ambulator with Rollator ADLs Comments: aide assists with ADL's/IADL's, able to manage toileting without assist but aide assists with most other ADLs.  Her sister manages meals and assists as needed when aide is not present.  Aide sets up pill box weekly.        OT Problem List: Pain      OT Treatment/Interventions: Self-care/ADL training;Balance training;Therapeutic exercise;Therapeutic activities;DME and/or AE instruction;Patient/family education    OT Goals(Current goals can be found in the care plan section) Acute Rehab OT Goals Patient Stated Goal: To get better OT Goal Formulation: With patient Time For Goal Achievement: 05/11/23 Potential to Achieve Goals: Good ADL Goals Pt Will Perform Grooming: with supervision;standing Pt Will Perform Lower Body Bathing: with set-up;with supervision;with adaptive equipment;sitting/lateral leans Pt Will Transfer to Toilet: with supervision;ambulating Pt  Will Perform Tub/Shower Transfer: with modified independence;Tub transfer;tub bench  OT Frequency: Min 1X/week    Co-evaluation              AM-PAC OT "6 Clicks" Daily Activity     Outcome Measure Help from another person eating meals?: None Help from another person taking care of personal grooming?: A Little Help from another person toileting, which includes using toliet, bedpan, or urinal?: A Little Help from another person bathing (including washing, rinsing, drying)?: A Little Help from another person to put on and taking off regular upper body clothing?: A Little Help from another person to put on and taking off regular lower body clothing?: A Lot 6 Click Score: 18   End of Session Nurse Communication: Mobility status  Activity Tolerance: Patient tolerated treatment well Patient left: Other (comment) (on BSC with care providing PT)  OT Visit Diagnosis: Pain Pain - part of body:  (abdomen)                Time: 7425-9563 OT Time Calculation (min): 12 min Charges:  OT General Charges $OT Visit: 1 Visit OT Evaluation $OT Eval Low Complexity: 1 Low  04/27/2023  AB, OTR/L  Acute Rehabilitation Services  Office: [phone]   Tristan Schroeder 04/27/2023, 9:55 AM

## 2023-04-27 NOTE — Progress Notes (Signed)
Dr Loney Loh [facility] was notified via chat that zofran wasn't effective and patient was vomiting. She stated that Reglan and benadryl IV worked better.

## 2023-04-27 NOTE — TOC CM/SW Note (Signed)
Transition of Care [facility]) - Inpatient Brief Assessment   Patient Details  Name: [name] MRN: [id number] Date of Birth: [birth year]  Transition of Care [facility]) CM/SW Contact:    Tom-Johnson, Hershal Coria, RN Phone Number: 04/27/2023, 3:51 PM   Clinical Narrative:  Patient presenting to the ED with Abdominal Pain, Chest Wall Pain with N/V x 1 week. Patient has hx of DVT, on Eliquis, Lupus, Anxiety, DM, Sjogren syndrome.   From home alone, has one supportive son. Independent with her care, does not drive, family transports to and from appointments. Has all necessary DME's at home.  PCP is Verlon Au, MD and uses [facility] on [address]   Home Health recommended, patient states she has no preference.  CM called in referral to [facility] and Tresa Endo voiced acceptance, info on AVS.    Patient not Medically ready for discharge.  CM will continue to follow as patient progresses with care towards discharge.            Transition of Care Asessment: Insurance and Status: Insurance coverage has been reviewed Patient has primary care physician: Yes Home environment has been reviewed: Yes Prior level of function:: Modified Independent Prior/Current Home Services: No current home services Social Determinants of Health Reivew: SDOH reviewed no interventions necessary Readmission risk has been reviewed: Yes Transition of care needs: transition of care needs identified, TOC will continue to follow

## 2023-04-27 NOTE — Progress Notes (Signed)
Initial Nutrition Assessment  DOCUMENTATION CODES:   Morbid obesity, Non-severe (moderate) malnutrition in context of chronic illness  INTERVENTION:  Increase Boost Breeze to four times a day, Advance diet as medically applicable.    NUTRITION DIAGNOSIS:   Moderate Malnutrition related to chronic illness as evidenced by mild fat depletion, moderate muscle depletion, energy intake < or equal to 50% for > or equal to 5 days.    GOAL:   Patient will meet greater than or equal to 90% of their needs    MONITOR:   PO intake, Supplement acceptance, Weight trends, Labs, Diet advancement  REASON FOR ASSESSMENT:   Malnutrition Screening Tool    ASSESSMENT:  52 y.o. F, female with medical history significant for insulin-dependent diabetes mellitus, chronic HFpEF, history of DVT on Eliquis, hypothyroidism, depression, anxiety, lupus, Sjogren syndrome, and BMI 55 who presented to the emergency department with upper abdominal pain, nausea, and vomiting.  According to the patient's symptoms have been ongoing for about 2 weeks.  She underwent CT scan of the abdomen and pelvis which did not show any acute findings.  She was hospitalized for further management Pt noted with Nausea and vomiting Pt not feeling well on this day of visit stated that in July she weighed ~340# and upon admit she was 304#, Confirms this was not intentional weight loss. Lunch on table, less than 25% of it was eaten. She did say that she has been trying to consume as much liquids as possible. She is receiving boost breeze TID will increase to QID till diet advanced.  Should 11/25/2022 weight be accurate pt would have  14% weight loss within 71month time frame.  Meds;  Abilify, Wellbutrin, novLOG, Lyric, Seroquel, Topamax,Labs; K;3.4 Supplement; Boost Breeze TID (250kcal, 9g protein/carton) Last 10 weight encounters.  04/27/23 (!) 140.3 kg  11/19/22 (!) 163.7 kg  10/21/22 (!) 140.6 kg  10/18/22 (!) 140.6 kg  10/14/22  (!) 149.7 kg  10/13/22 (!) 149.7 kg  09/21/22 (!) 145.6 kg  05/25/22 (!) 152.9 kg  04/27/22 (!) 156.8 kg  03/21/22 (!) 154.2 kg    NUTRITION - FOCUSED PHYSICAL EXAM:  Flowsheet Row Most Recent Value  Orbital Region Mild depletion  Upper Arm Region Moderate depletion  Thoracic and Lumbar Region Mild depletion  Buccal Region Mild depletion  Temple Region Moderate depletion  Clavicle Bone Region Mild depletion  Clavicle and Acromion Bone Region Mild depletion  Scapular Bone Region Unable to assess  Dorsal Hand Mild depletion  Patellar Region Moderate depletion  Anterior Thigh Region Moderate depletion  Posterior Calf Region Mild depletion  Edema (RD Assessment) None  Hair Reviewed  Eyes Reviewed  Mouth Reviewed  Skin Reviewed  Nails Reviewed       Diet Order:   Diet Order             Diet clear liquid Room service appropriate? Yes; Fluid consistency: Thin  Diet effective now                   EDUCATION NEEDS:   Not appropriate for education at this time  Skin:  Skin Assessment: Reviewed RN Assessment  Last BM:  Prior to admission  Height:   Ht Readings from Last 1 Encounters:  10/25/22 5\' 8"  (1.727 m)    Weight:   Wt Readings from Last 1 Encounters:  04/27/23 (!) 140.3 kg    Ideal Body Weight:  64 kg  BMI:  Body mass index is 47.03 kg/m.  Estimated Nutritional Needs:  Kcal:  1909-22 kcal (30-35kcal/kgIBW)  Protein:  76-95g (1.2-1.5g/kgIBW)  Fluid:  1591-1961ml (25-72ml/kgIBW)    [name], RDN, LDN

## 2023-04-27 NOTE — Progress Notes (Signed)
[facility] PROGRESS NOTE   JARETZI KAUT BJY:[id number] DOB: 1971/03/16 DOA: 04/24/2023  PCP: Verlon Au, MD  Brief History: 52 y.o. female with medical history significant for insulin-dependent diabetes mellitus, chronic HFpEF, history of DVT on Eliquis, hypothyroidism, depression, anxiety, lupus, Sjogren syndrome, and BMI 55 who presented to the emergency department with upper abdominal pain, nausea, and vomiting.  According to the patient's symptoms have been ongoing for about 2 weeks.  She underwent CT scan of the abdomen and pelvis which did not show any acute findings.  She was hospitalized for further management.  Of note she has had a normal gastric emptying study in 2021.  Had an EGD in 2022 which revealed gastritis.  Consultants: None  Procedures: None yet    Subjective/Interval History: Patient is not sure feel any better compared to yesterday.  She mentions that she responds better to metoclopramide.  She is aware that she had an allergic reaction but it was several years ago.  She has tolerated metoclopramide with Benadryl since then.     Assessment/Plan:  Intractable nausea and vomiting CT was reassuring.  LFTs were normal.  Lipase was normal.  She does have diabetes so diabetic gastroparesis is likely although she did have a negative gastric emptying study in 2021.  Viral illness is also a possibility.   Not much improvement in the last 24 hours.  Will give her metoclopramide with the Benadryl that she requests.  Will increase PPI to twice daily.  Will add Carafate.   It looks like her last upper endoscopy was done in June 2024 at [facility].  Apparently this showed no significant findings.  Hiatal hernia was noted.  Hypokalemia Likely due to GI loss.  Continue to supplement.  Check magnesium.  Insulin-dependent diabetes mellitus HbA1c was 7.4 in April 2024.  Long-acting insulin currently on hold due to low glucose levels.  Continue just SSI for now.     Chronic diastolic CHF Holding diuretics for now due to hypovolemic status.  Normocytic anemia Likely due to chronic disease.  No evidence for overt bleeding.  History of DVT Continue Eliquis.  History of lupus and Sjogren's syndrome Continue Plaquenil.  History of depression and anxiety Continue with her home medications.  Hypothyroidism Continue levothyroxine  Obesity Estimated body mass index is 47.03 kg/m as calculated from the following:   Height as of 10/25/22: 5\' 8"  (1.727 m).   Weight as of this encounter: 140.3 kg.   DVT Prophylaxis: On apixaban Code Status: Full code Family Communication: Discussed with patient Disposition Plan: Hopefully return home when improved    Medications: Scheduled:  apixaban  2.5 mg Oral BID   ARIPiprazole  5 mg Oral Daily   buPROPion  300 mg Oral Daily   carvedilol  37.5 mg Oral BID WC   metoCLOPramide (REGLAN) injection  10 mg Intravenous TID AC   And   diphenhydrAMINE  12.5 mg Intravenous TID AC   feeding supplement  1 Container Oral TID BM   hydroxychloroquine  200 mg Oral BID   insulin aspart  0-15 Units Subcutaneous TID WC   insulin aspart  0-5 Units Subcutaneous QHS   isosorbide-hydrALAZINE  1 tablet Oral TID   levothyroxine  50 mcg Oral Q0600   pantoprazole (PROTONIX) IV  40 mg Intravenous Q12H   pregabalin  75 mg Oral TID   QUEtiapine  300 mg Oral QHS   rosuvastatin  20 mg Oral Daily   sodium chloride flush  3 mL Intravenous Q12H  sucralfate  1 g Oral TID WC & HS   topiramate  75 mg Oral QHS   Continuous:  sodium chloride 75 mL/hr at 04/26/23 2329   potassium chloride 10 mEq (04/27/23 0929)   NWG:[id number] **OR** acetaminophen, albuterol, ALPRAZolam, HYDROmorphone (DILAUDID) injection, ondansetron **OR** ondansetron (ZOFRAN) IV, prochlorperazine  Antibiotics: Anti-infectives (From admission, onward)    Start     Dose/Rate Route Frequency Ordered Stop   04/25/23 1000  hydroxychloroquine (PLAQUENIL)  tablet 200 mg        200 mg Oral 2 times daily 04/25/23 0441         Objective:  Vital Signs  Vitals:   04/26/23 1704 04/26/23 2001 04/27/23 0545 04/27/23 0552  BP: 131/75 97/64 110/62   Pulse: 80 71 76   Resp: 18  16   Temp: 98.2 F (36.8 C) 98.4 F (36.9 C) 98.4 F (36.9 C)   TempSrc:  Oral    SpO2: 100% 100% 97%   Weight:    (!) 140.3 kg    Intake/Output Summary (Last 24 hours) at 04/27/2023 0947 Last data filed at 04/26/2023 1904 Gross per 24 hour  Intake 645.38 ml  Output --  Net 645.38 ml   Filed Weights   04/27/23 0552  Weight: (!) 140.3 kg    General appearance: Awake alert.  In no distress Resp: Clear to auscultation bilaterally.  Normal effort Cardio: S1-S2 is normal regular.  No S3-S4.  No rubs murmurs or bruit GI: Abdomen is soft.  Vaguely tender without any rebound rigidity or guarding.  No masses organomegaly.  Bowel sounds present. Extremities: No edema.  Full range of motion of lower extremities. Neurologic: Alert and oriented x3.  No focal neurological deficits.     Lab Results:  Data Reviewed: I have personally reviewed following labs and reports of the imaging studies  CBC: Recent Labs  Lab 04/24/23 2249 04/25/23 0713 04/26/23 0431 04/27/23 0539  WBC 9.7 9.1 8.0 8.4  HGB 10.2* 9.9* 9.5* 9.6*  HCT 31.9* 30.8* 30.5* 31.1*  MCV 86.9 88.0 87.1 86.9  PLT 215 208 198 172    Basic Metabolic Panel: Recent Labs  Lab 04/24/23 2249 04/25/23 0121 04/25/23 1004 04/26/23 0431 04/27/23 0539  NA 139  --  136 137 136  K 2.8*  --  3.2* 4.0 3.4*  CL 105  --  103 107 109  CO2 25  --  21* 22 22  GLUCOSE 121*  --  135* 81 104*  BUN 7  --  8 7 5*  CREATININE 0.95  --  0.84 0.84 0.91  CALCIUM 8.6*  --  8.3* 8.5* 8.2*  MG  --  1.7 1.7  --   --     GFR: CrCl cannot be calculated (Unknown ideal weight.).  Liver Function Tests: Recent Labs  Lab 04/24/23 2249  AST 12*  ALT 8  ALKPHOS 61  BILITOT 1.1  PROT 6.6  ALBUMIN 2.9*    Recent  Labs  Lab 04/24/23 2249  LIPASE 24    HbA1C: Recent Labs    04/25/23 0713  HGBA1C 7.3*    CBG: Recent Labs  Lab 04/26/23 0737 04/26/23 1123 04/26/23 1639 04/26/23 1958 04/27/23 0718  GLUCAP 82 95 163* 141* 93    Radiology Studies: No results found.     LOS: 1 day   Chancy Claros [employer] on [email or web address]  04/27/2023, 9:47 AM

## 2023-04-27 NOTE — Plan of Care (Signed)

## 2023-04-28 DIAGNOSIS — F419 Anxiety disorder, unspecified: Secondary | ICD-10-CM | POA: Diagnosis not present

## 2023-04-28 DIAGNOSIS — I10 Essential (primary) hypertension: Secondary | ICD-10-CM | POA: Diagnosis not present

## 2023-04-28 DIAGNOSIS — F32A Depression, unspecified: Secondary | ICD-10-CM | POA: Diagnosis not present

## 2023-04-28 DIAGNOSIS — R112 Nausea with vomiting, unspecified: Secondary | ICD-10-CM | POA: Diagnosis not present

## 2023-04-28 LAB — BASIC METABOLIC PANEL
Anion gap: 6 (ref 5–15)
BUN: <5 mg/dL — ABNORMAL LOW (ref 6–20)
CO2: 25 mmol/L (ref 22–32)
Calcium: 8.5 mg/dL — ABNORMAL LOW (ref 8.9–10.3)
Chloride: 107 mmol/L (ref 98–111)
Creatinine, Ser: 1.08 mg/dL — ABNORMAL HIGH (ref 0.44–1.00)
GFR, Estimated: >60 mL/min (ref >60)
Glucose, Bld: 125 mg/dL — ABNORMAL HIGH (ref 70–99)
Potassium: 3.5 mmol/L (ref 3.5–5.1)
Sodium: 138 mmol/L (ref 135–145)

## 2023-04-28 LAB — CBC
HCT: 29.4 % — ABNORMAL LOW (ref 36.0–46.0)
Hemoglobin: 9.2 g/dL — ABNORMAL LOW (ref 12.0–15.0)
MCH: 27.5 pg (ref 26.0–34.0)
MCHC: 31.3 g/dL (ref 30.0–36.0)
MCV: 87.8 fL (ref 80.0–100.0)
Platelets: 162 10*3/uL (ref 150–400)
RBC: 3.35 MIL/uL — ABNORMAL LOW (ref 3.87–5.11)
RDW: 16.1 % — ABNORMAL HIGH (ref 11.5–15.5)
WBC: 6.6 10*3/uL (ref 4.0–10.5)
nRBC: 0 % (ref 0.0–0.2)

## 2023-04-28 LAB — GLUCOSE, CAPILLARY
Glucose-Capillary: 109 mg/dL — ABNORMAL HIGH (ref 70–99)
Glucose-Capillary: 139 mg/dL — ABNORMAL HIGH (ref 70–99)
Glucose-Capillary: 162 mg/dL — ABNORMAL HIGH (ref 70–99)
Glucose-Capillary: 181 mg/dL — ABNORMAL HIGH (ref 70–99)

## 2023-04-28 LAB — MAGNESIUM: Magnesium: 1.6 mg/dL — ABNORMAL LOW (ref 1.7–2.4)

## 2023-04-28 MED ORDER — MAGNESIUM SULFATE 2 GM/50ML IV SOLN
2.0000 g | Freq: Once | INTRAVENOUS | Status: AC
  Administered 2023-04-28: 2 g via INTRAVENOUS
  Filled 2023-04-28: qty 50

## 2023-04-28 MED ORDER — SODIUM CHLORIDE 0.45 % IV SOLN: INTRAVENOUS | Status: AC

## 2023-04-28 MED ORDER — POTASSIUM CHLORIDE CRYS ER 20 MEQ PO TBCR
40.0000 meq | EXTENDED_RELEASE_TABLET | Freq: Once | ORAL | Status: AC
  Administered 2023-04-28: 40 meq via ORAL
  Filled 2023-04-28: qty 2

## 2023-04-28 NOTE — Plan of Care (Signed)

## 2023-04-28 NOTE — Progress Notes (Signed)
[facility] PROGRESS NOTE   [name] [id number] DOB: May 20, 1971 DOA: 04/24/2023  PCP: Verlon Au, MD  Brief History: 52 y.o. female with medical history significant for insulin-dependent diabetes mellitus, chronic HFpEF, history of DVT on Eliquis, hypothyroidism, depression, anxiety, lupus, Sjogren syndrome, and BMI 55 who presented to the emergency department with upper abdominal pain, nausea, and vomiting.  According to the patient's symptoms have been ongoing for about 2 weeks.  She underwent CT scan of the abdomen and pelvis which did not show any acute findings.  She was hospitalized for further management.  Of note she has had a normal gastric emptying study in 2021.  Had an EGD in 2022 which revealed gastritis.  Consultants: None  Procedures: None yet    Subjective/Interval History: Patient mentions that the last time she vomited was yesterday afternoon.  Feeling a little better.  Requesting solid food.     Assessment/Plan:  Intractable nausea and vomiting CT was reassuring.  LFTs were normal.  Lipase was normal.  She does have diabetes so diabetic gastroparesis is likely although she did have a negative gastric emptying study in 2021.  Viral illness is also a possibility.   It looks like her last upper endoscopy was done in June 2024 at [facility].  Apparently this showed no significant findings.  Hiatal hernia was noted. There was no improvement with Zofran.  Patient was subsequently given Reglan with Benadryl at her request.  No side effects have been noted.  Some improvement has been noted.  Continue PPI twice daily along with Carafate.  Advance to soft diet today.  Hypokalemia Likely due to GI loss.  Labs are pending from today.  Magnesium was 1.8 yesterday.  Insulin-dependent diabetes mellitus HbA1c was 7.4 in April 2024.  Long-acting insulin currently on hold due to low glucose levels.  Continue just SSI for now.  May need to add on basal insulin  since her diet is being advanced today.  Chronic diastolic CHF Holding diuretics for now due to hypovolemic status.  Normocytic anemia Likely due to chronic disease.  No evidence for overt bleeding.  History of DVT Continue Eliquis.  History of lupus and Sjogren's syndrome Continue Plaquenil.  History of depression and anxiety Continue with her home medications.  Hypothyroidism Continue levothyroxine  Obesity Estimated body mass index is 47.87 kg/m as calculated from the following:   Height as of 10/25/22: 5\' 8"  (1.727 m).   Weight as of this encounter: 142.8 kg.   DVT Prophylaxis: On apixaban Code Status: Full code Family Communication: Discussed with patient Disposition Plan: Hopefully return home when improved    Medications: Scheduled:  apixaban  2.5 mg Oral BID   ARIPiprazole  5 mg Oral Daily   buPROPion  300 mg Oral Daily   carvedilol  37.5 mg Oral BID WC   metoCLOPramide (REGLAN) injection  10 mg Intravenous TID AC   And   diphenhydrAMINE  12.5 mg Intravenous TID AC   feeding supplement  1 Container Oral QID   hydroxychloroquine  200 mg Oral BID   insulin aspart  0-15 Units Subcutaneous TID WC   insulin aspart  0-5 Units Subcutaneous QHS   isosorbide-hydrALAZINE  1 tablet Oral TID   levothyroxine  50 mcg Oral Q0600   pantoprazole (PROTONIX) IV  40 mg Intravenous Q12H   pregabalin  75 mg Oral TID   QUEtiapine  300 mg Oral QHS   rosuvastatin  20 mg Oral Daily   sodium chloride flush  3 mL Intravenous Q12H   sucralfate  1 g Oral TID WC & HS   topiramate  75 mg Oral QHS   Continuous:   UEA:[id number] **OR** acetaminophen, albuterol, ALPRAZolam, HYDROmorphone (DILAUDID) injection, ondansetron **OR** ondansetron (ZOFRAN) IV, prochlorperazine  Antibiotics: Anti-infectives (From admission, onward)    Start     Dose/Rate Route Frequency Ordered Stop   04/25/23 1000  hydroxychloroquine (PLAQUENIL) tablet 200 mg        200 mg Oral 2 times daily 04/25/23  0441         Objective:  Vital Signs  Vitals:   04/27/23 1628 04/27/23 2124 04/28/23 0500 04/28/23 0800  BP: 123/83 128/72  117/67  Pulse: (!) 109 71  84  Resp: 18     Temp: 98.7 F (37.1 C)   (!) 97.5 F (36.4 C)  TempSrc: Oral   Oral  SpO2: 99% 100%  100%  Weight:   (!) 142.8 kg     Intake/Output Summary (Last 24 hours) at 04/28/2023 0907 Last data filed at 04/28/2023 0847 Gross per 24 hour  Intake 840 ml  Output --  Net 840 ml   Filed Weights   04/27/23 0552 04/28/23 0500  Weight: (!) 140.3 kg (!) 142.8 kg    General appearance: Awake alert.  In no distress Resp: Clear to auscultation bilaterally.  Normal effort Cardio: S1-S2 is normal regular.  No S3-S4.  No rubs murmurs or bruit GI: Abdomen is soft.  Less tender today.  No masses organomegaly.   Lab Results:  Data Reviewed: I have personally reviewed following labs and reports of the imaging studies  CBC: Recent Labs  Lab 04/24/23 2249 04/25/23 0713 04/26/23 0431 04/27/23 0539  WBC 9.7 9.1 8.0 8.4  HGB 10.2* 9.9* 9.5* 9.6*  HCT 31.9* 30.8* 30.5* 31.1*  MCV 86.9 88.0 87.1 86.9  PLT 215 208 198 172    Basic Metabolic Panel: Recent Labs  Lab 04/24/23 2249 04/25/23 0121 04/25/23 1004 04/26/23 0431 04/27/23 0539  NA 139  --  136 137 136  K 2.8*  --  3.2* 4.0 3.4*  CL 105  --  103 107 109  CO2 25  --  21* 22 22  GLUCOSE 121*  --  135* 81 104*  BUN 7  --  8 7 5*  CREATININE 0.95  --  0.84 0.84 0.91  CALCIUM 8.6*  --  8.3* 8.5* 8.2*  MG  --  1.7 1.7  --  1.8    GFR: CrCl cannot be calculated (Unknown ideal weight.).  Liver Function Tests: Recent Labs  Lab 04/24/23 2249  AST 12*  ALT 8  ALKPHOS 61  BILITOT 1.1  PROT 6.6  ALBUMIN 2.9*    Recent Labs  Lab 04/24/23 2249  LIPASE 24    CBG: Recent Labs  Lab 04/27/23 0718 04/27/23 1126 04/27/23 1622 04/27/23 2131 04/28/23 0747  GLUCAP 93 107* 152* 119* 109*    Radiology Studies: No results found.     LOS: 2 days    Mando Blatz Rito Ehrlich  [facility] Pager on [email or web address]  04/28/2023, 9:07 AM

## 2023-04-29 DIAGNOSIS — F419 Anxiety disorder, unspecified: Secondary | ICD-10-CM | POA: Diagnosis not present

## 2023-04-29 DIAGNOSIS — R112 Nausea with vomiting, unspecified: Secondary | ICD-10-CM | POA: Diagnosis not present

## 2023-04-29 DIAGNOSIS — F32A Depression, unspecified: Secondary | ICD-10-CM | POA: Diagnosis not present

## 2023-04-29 DIAGNOSIS — I10 Essential (primary) hypertension: Secondary | ICD-10-CM | POA: Diagnosis not present

## 2023-04-29 LAB — BASIC METABOLIC PANEL
Anion gap: 8 (ref 5–15)
BUN: <5 mg/dL — ABNORMAL LOW (ref 6–20)
CO2: 20 mmol/L — ABNORMAL LOW (ref 22–32)
Calcium: 8.2 mg/dL — ABNORMAL LOW (ref 8.9–10.3)
Chloride: 107 mmol/L (ref 98–111)
Creatinine, Ser: 1.06 mg/dL — ABNORMAL HIGH (ref 0.44–1.00)
GFR, Estimated: >60 mL/min (ref >60)
Glucose, Bld: 155 mg/dL — ABNORMAL HIGH (ref 70–99)
Potassium: 3.6 mmol/L (ref 3.5–5.1)
Sodium: 135 mmol/L (ref 135–145)

## 2023-04-29 LAB — GLUCOSE, CAPILLARY
Glucose-Capillary: 165 mg/dL — ABNORMAL HIGH (ref 70–99)
Glucose-Capillary: 134 mg/dL — ABNORMAL HIGH (ref 70–99)
Glucose-Capillary: 155 mg/dL — ABNORMAL HIGH (ref 70–99)
Glucose-Capillary: 170 mg/dL — ABNORMAL HIGH (ref 70–99)

## 2023-04-29 LAB — MAGNESIUM: Magnesium: 1.8 mg/dL (ref 1.7–2.4)

## 2023-04-29 MED ORDER — PNEUMOCOCCAL 20-VAL CONJ VACC 0.5 ML IM SUSY
0.5000 mL | PREFILLED_SYRINGE | INTRAMUSCULAR | Status: DC
  Filled 2023-04-29: qty 0.5

## 2023-04-29 MED ORDER — METOCLOPRAMIDE HCL 5 MG PO TABS
10.0000 mg | ORAL_TABLET | Freq: Three times a day (TID) | ORAL | Status: DC
  Administered 2023-04-29 – 2023-05-03 (×12): 10 mg via ORAL
  Filled 2023-04-29 (×12): qty 2

## 2023-04-29 MED ORDER — DIPHENHYDRAMINE HCL 50 MG/ML IJ SOLN
12.5000 mg | Freq: Three times a day (TID) | INTRAMUSCULAR | Status: DC
  Administered 2023-04-29 – 2023-05-03 (×12): 12.5 mg via INTRAVENOUS
  Filled 2023-04-29 (×10): qty 1

## 2023-04-29 MED ORDER — POTASSIUM CHLORIDE CRYS ER 20 MEQ PO TBCR
40.0000 meq | EXTENDED_RELEASE_TABLET | Freq: Once | ORAL | Status: AC
  Administered 2023-04-29: 40 meq via ORAL
  Filled 2023-04-29: qty 2

## 2023-04-29 MED ORDER — SODIUM CHLORIDE 0.45 % IV SOLN: INTRAVENOUS | Status: AC

## 2023-04-29 MED ORDER — ORAL CARE MOUTH RINSE: 15.0000 mL | OROMUCOSAL | Status: DC | PRN

## 2023-04-29 MED ORDER — ISOSORB DINITRATE-HYDRALAZINE 20-37.5 MG PO TABS: 1.0000 | ORAL_TABLET | Freq: Three times a day (TID) | ORAL | Status: DC

## 2023-04-29 NOTE — Plan of Care (Signed)
  Problem: Education: Goal: Individualized Educational Video(s) Outcome: Progressing   Problem: Coping: Goal: Ability to adjust to condition or change in health will improve Outcome: Progressing   Problem: Fluid Volume: Goal: Ability to maintain a balanced intake and output will improve Outcome: Progressing   Problem: Health Behavior/Discharge Planning: Goal: Ability to identify and utilize available resources and services will improve Outcome: Progressing Goal: Ability to manage health-related needs will improve Outcome: Progressing   Problem: Metabolic: Goal: Ability to maintain appropriate glucose levels will improve Outcome: Progressing   Problem: Nutritional: Goal: Maintenance of adequate nutrition will improve Outcome: Progressing Goal: Progress toward achieving an optimal weight will improve Outcome: Progressing   Problem: Skin Integrity: Goal: Risk for impaired skin integrity will decrease Outcome: Progressing   Problem: Tissue Perfusion: Goal: Adequacy of tissue perfusion will improve Outcome: Progressing   Problem: Education: Goal: Knowledge of General Education information will improve Description: Including pain rating scale, medication(s)/side effects and non-pharmacologic comfort measures Outcome: Progressing   Problem: Health Behavior/Discharge Planning: Goal: Ability to manage health-related needs will improve Outcome: Progressing   Problem: Clinical Measurements: Goal: Ability to maintain clinical measurements within normal limits will improve Outcome: Progressing Goal: Will remain free from infection Outcome: Progressing Goal: Diagnostic test results will improve Outcome: Progressing Goal: Respiratory complications will improve Outcome: Progressing Goal: Cardiovascular complication will be avoided Outcome: Progressing   Problem: Activity: Goal: Risk for activity intolerance will decrease Outcome: Progressing   Problem: Nutrition: Goal:  Adequate nutrition will be maintained Outcome: Progressing   Problem: Coping: Goal: Level of anxiety will decrease Outcome: Progressing   Problem: Elimination: Goal: Will not experience complications related to bowel motility Outcome: Progressing Goal: Will not experience complications related to urinary retention Outcome: Progressing   Problem: Pain Managment: Goal: General experience of comfort will improve Outcome: Progressing   Problem: Safety: Goal: Ability to remain free from injury will improve Outcome: Progressing   Problem: Skin Integrity: Goal: Risk for impaired skin integrity will decrease Outcome: Progressing   

## 2023-04-29 NOTE — Progress Notes (Signed)
[facility] PROGRESS NOTE   [name] [id number] DOB: [birth year] DOA: 04/24/2023  PCP: Verlon Au, MD  Brief History: 52 y.o. female with medical history significant for insulin-dependent diabetes mellitus, chronic HFpEF, history of DVT on Eliquis, hypothyroidism, depression, anxiety, lupus, Sjogren syndrome, and BMI 55 who presented to the emergency department with upper abdominal pain, nausea, and vomiting.  According to the patient's symptoms have been ongoing for about 2 weeks.  She underwent CT scan of the abdomen and pelvis which did not show any acute findings.  She was hospitalized for further management.  Of note she has had a normal gastric emptying study in 2021.    Consultants: None  Procedures: None yet    Subjective/Interval History: Patient mentions that she is starting to feel better.  Still nauseated but did not have any vomiting yesterday.  Tolerating some of her meals.  Had a bowel movement yesterday.  She has been ambulating.  Assessment/Plan:  Intractable nausea and vomiting CT was reassuring.  LFTs were normal.  Lipase was normal.  She does have diabetes so diabetic gastroparesis is likely although she did have a negative gastric emptying study in 2021.  Viral illness is also a possibility.   It looks like her last upper endoscopy was done in June 2024 at [facility].  Apparently this showed no significant findings.  Hiatal hernia was noted. There was no improvement with Zofran.  Patient was subsequently given Reglan with Benadryl at her request.  No side effects have been noted.  Continue with PPI and Carafate.  Seems to be improving.  Transition to oral loperamide tonight.  If she does not have any vomiting in the next 24 hours she should be able to go home tomorrow.    Hypokalemia Supplemented.    Acute kidney injury Likely due to hypovolemia.  Continue with IV fluids for another 24 hours.  Recheck labs tomorrow.  Insulin-dependent  diabetes mellitus HbA1c was 7.4 in April 2024.  Long-acting insulin currently on hold due to low glucose levels.  Continue just SSI for now.    Chronic diastolic CHF Holding diuretics for now due to hypovolemic status.  Normocytic anemia Likely due to chronic disease.  No evidence for overt bleeding.  History of DVT Continue Eliquis.  History of lupus and Sjogren's syndrome Continue Plaquenil.  History of depression and anxiety Continue with her home medications.  Hypothyroidism Continue levothyroxine  Obesity Estimated body mass index is 47.87 kg/m as calculated from the following:   Height as of 10/25/22: 5\' 8"  (1.727 m).   Weight as of this encounter: 142.8 kg.   DVT Prophylaxis: On apixaban Code Status: Full code Family Communication: Discussed with patient Disposition Plan: Hopefully return home when improved    Medications: Scheduled:  apixaban  2.5 mg Oral BID   ARIPiprazole  5 mg Oral Daily   buPROPion  300 mg Oral Daily   carvedilol  37.5 mg Oral BID WC   metoCLOPramide (REGLAN) injection  10 mg Intravenous TID AC   And   diphenhydrAMINE  12.5 mg Intravenous TID AC   feeding supplement  1 Container Oral QID   hydroxychloroquine  200 mg Oral BID   insulin aspart  0-15 Units Subcutaneous TID WC   insulin aspart  0-5 Units Subcutaneous QHS   isosorbide-hydrALAZINE  1 tablet Oral TID   levothyroxine  50 mcg Oral Q0600   pantoprazole (PROTONIX) IV  40 mg Intravenous Q12H   [START ON 04/30/2023] pneumococcal 20-valent conjugate vaccine  0.5 mL Intramuscular Tomorrow-1000   pregabalin  75 mg Oral TID   QUEtiapine  300 mg Oral QHS   rosuvastatin  20 mg Oral Daily   sodium chloride flush  3 mL Intravenous Q12H   sucralfate  1 g Oral TID WC & HS   topiramate  75 mg Oral QHS   Continuous:   UJW:[id number] **OR** acetaminophen, albuterol, ALPRAZolam, HYDROmorphone (DILAUDID) injection, ondansetron **OR** ondansetron (ZOFRAN) IV, mouth rinse,  prochlorperazine  Antibiotics: Anti-infectives (From admission, onward)    Start     Dose/Rate Route Frequency Ordered Stop   04/25/23 1000  hydroxychloroquine (PLAQUENIL) tablet 200 mg        200 mg Oral 2 times daily 04/25/23 0441         Objective:  Vital Signs  Vitals:   04/28/23 2145 04/29/23 0524 04/29/23 0545 04/29/23 0820  BP: 118/62 (!) 84/55 (!) 112/58 (!) 104/56  Pulse:  79  79  Resp:  16    Temp:  97.8 F (36.6 C)    TempSrc:  Oral    SpO2:  99%  100%  Weight:        Intake/Output Summary (Last 24 hours) at 04/29/2023 0957 Last data filed at 04/29/2023 0820 Gross per 24 hour  Intake 2254.6 ml  Output 275 ml  Net 1979.6 ml   Filed Weights   04/27/23 0552 04/28/23 0500  Weight: (!) 140.3 kg (!) 142.8 kg    General appearance: Awake alert.  In no distress Resp: Clear to auscultation bilaterally.  Normal effort Cardio: S1-S2 is normal regular.  No S3-S4.  No rubs murmurs or bruit GI: Abdomen is soft.  Nontender nondistended.  Bowel sounds are present normal.  No masses organomegaly    Lab Results:  Data Reviewed: I have personally reviewed following labs and reports of the imaging studies  CBC: Recent Labs  Lab 04/24/23 2249 04/25/23 0713 04/26/23 0431 04/27/23 0539 04/28/23 1037  WBC 9.7 9.1 8.0 8.4 6.6  HGB 10.2* 9.9* 9.5* 9.6* 9.2*  HCT 31.9* 30.8* 30.5* 31.1* 29.4*  MCV 86.9 88.0 87.1 86.9 87.8  PLT 215 208 198 172 162    Basic Metabolic Panel: Recent Labs  Lab 04/25/23 0121 04/25/23 1004 04/26/23 0431 04/27/23 0539 04/28/23 1037 04/29/23 0734  NA  --  136 137 136 138 135  K  --  3.2* 4.0 3.4* 3.5 3.6  CL  --  103 107 109 107 107  CO2  --  21* 22 22 25  20*  GLUCOSE  --  135* 81 104* 125* 155*  BUN  --  8 7 5* <5* <5*  CREATININE  --  0.84 0.84 0.91 1.08* 1.06*  CALCIUM  --  8.3* 8.5* 8.2* 8.5* 8.2*  MG 1.7 1.7  --  1.8 1.6* 1.8    GFR: CrCl cannot be calculated (Unknown ideal weight.).  Liver Function Tests: Recent  Labs  Lab 04/24/23 2249  AST 12*  ALT 8  ALKPHOS 61  BILITOT 1.1  PROT 6.6  ALBUMIN 2.9*    Recent Labs  Lab 04/24/23 2249  LIPASE 24    CBG: Recent Labs  Lab 04/28/23 0747 04/28/23 1136 04/28/23 1608 04/28/23 1938 04/29/23 0730  GLUCAP 109* 139* 162* 181* 165*    Radiology Studies: No results found.     LOS: 3 days   Kayela Humphres [employer] on [email or web address]  04/29/2023, 9:57 AM

## 2023-04-30 DIAGNOSIS — F419 Anxiety disorder, unspecified: Secondary | ICD-10-CM | POA: Diagnosis not present

## 2023-04-30 DIAGNOSIS — I10 Essential (primary) hypertension: Secondary | ICD-10-CM | POA: Diagnosis not present

## 2023-04-30 DIAGNOSIS — N179 Acute kidney failure, unspecified: Secondary | ICD-10-CM | POA: Diagnosis not present

## 2023-04-30 DIAGNOSIS — R112 Nausea with vomiting, unspecified: Secondary | ICD-10-CM | POA: Diagnosis not present

## 2023-04-30 LAB — BASIC METABOLIC PANEL
Anion gap: 9 (ref 5–15)
BUN: 10 mg/dL (ref 6–20)
CO2: 17 mmol/L — ABNORMAL LOW (ref 22–32)
Calcium: 7.9 mg/dL — ABNORMAL LOW (ref 8.9–10.3)
Chloride: 108 mmol/L (ref 98–111)
Creatinine, Ser: 1.2 mg/dL — ABNORMAL HIGH (ref 0.44–1.00)
GFR, Estimated: 54 mL/min — ABNORMAL LOW (ref >60)
Glucose, Bld: 154 mg/dL — ABNORMAL HIGH (ref 70–99)
Potassium: 4.1 mmol/L (ref 3.5–5.1)
Sodium: 134 mmol/L — ABNORMAL LOW (ref 135–145)

## 2023-04-30 LAB — URINALYSIS, ROUTINE W REFLEX MICROSCOPIC
Bilirubin Urine: NEGATIVE
Glucose, UA: NEGATIVE mg/dL
Hgb urine dipstick: NEGATIVE
Ketones, ur: NEGATIVE mg/dL
Nitrite: POSITIVE — AB
Protein, ur: 30 mg/dL — AB
RBC / HPF: >50 RBC/hpf (ref 0–5)
Specific Gravity, Urine: 1.014 (ref 1.005–1.030)
WBC, UA: >50 WBC/hpf (ref 0–5)
pH: 6 (ref 5.0–8.0)

## 2023-04-30 LAB — GLUCOSE, CAPILLARY
Glucose-Capillary: 135 mg/dL — ABNORMAL HIGH (ref 70–99)
Glucose-Capillary: 142 mg/dL — ABNORMAL HIGH (ref 70–99)
Glucose-Capillary: 173 mg/dL — ABNORMAL HIGH (ref 70–99)
Glucose-Capillary: 165 mg/dL — ABNORMAL HIGH (ref 70–99)

## 2023-04-30 MED ORDER — CARVEDILOL 12.5 MG PO TABS
12.5000 mg | ORAL_TABLET | Freq: Two times a day (BID) | ORAL | Status: DC
  Filled 2023-04-30: qty 1

## 2023-04-30 MED ORDER — ISOSORB DINITRATE-HYDRALAZINE 20-37.5 MG PO TABS
1.0000 | ORAL_TABLET | Freq: Three times a day (TID) | ORAL | Status: DC
  Filled 2023-04-30: qty 1

## 2023-04-30 MED ORDER — SODIUM CHLORIDE 0.9 % IV BOLUS
500.0000 mL | Freq: Once | INTRAVENOUS | Status: AC
  Administered 2023-04-30: 500 mL via INTRAVENOUS

## 2023-04-30 MED ORDER — SODIUM CHLORIDE 0.9 % IV SOLN
2.0000 g | INTRAVENOUS | Status: DC
  Administered 2023-04-30 – 2023-05-01 (×2): 2 g via INTRAVENOUS
  Filled 2023-04-30 (×2): qty 20

## 2023-04-30 MED ORDER — SODIUM BICARBONATE 650 MG PO TABS
1300.0000 mg | ORAL_TABLET | Freq: Two times a day (BID) | ORAL | Status: DC
  Administered 2023-04-30 – 2023-05-01 (×4): 1300 mg via ORAL
  Filled 2023-04-30 (×4): qty 2

## 2023-04-30 MED ORDER — SODIUM CHLORIDE 0.9 % IV SOLN: INTRAVENOUS | Status: DC

## 2023-04-30 NOTE — Progress Notes (Signed)
[facility] PROGRESS NOTE   [name] ZOX:[id number] DOB: [birth year] DOA: 04/24/2023  PCP: Verlon Au, MD  Brief History: 52 y.o. female with medical history significant for insulin-dependent diabetes mellitus, chronic HFpEF, history of DVT on Eliquis, hypothyroidism, depression, anxiety, lupus, Sjogren syndrome, and BMI 55 who presented to the emergency department with upper abdominal pain, nausea, and vomiting.  According to the patient's symptoms have been ongoing for about 2 weeks.  She underwent CT scan of the abdomen and pelvis which did not show any acute findings.  She was hospitalized for further management.  Of note she has had a normal gastric emptying study in 2021.    Consultants: None  Procedures: None yet    Subjective/Interval History: Patient feels weak this morning.  Complains of burning sensation with urination.  Some nausea but no vomiting in the last 24 hours.    Assessment/Plan:  Intractable nausea and vomiting possibly due to diabetic gastroparesis CT was reassuring.  LFTs were normal.  Lipase was normal.  She does have diabetes so diabetic gastroparesis is likely although she did have a negative gastric emptying study in 2021.  Viral illness is also a possibility.   It looks like her last upper endoscopy was done in June 2024 at [facility].  Apparently this showed no significant findings.  Hiatal hernia was noted. There was no improvement with Zofran.  Patient was subsequently given Reglan with Benadryl at her request.  No side effects have been noted.  Continue with PPI and Carafate.   Patient is gradually improving.  Changed over to oral metoclopramide yesterday.    Acute kidney injury Likely due to hypovolemia.  Rising creatinine noted.  Likely due to combination of hypovolemia as well as hypotension.  Will give fluid bolus today and recheck labs tomorrow.  Monitor urine output.  Avoid nephrotoxic agents.  Check a UA since she is complaining of  dysuria.  Recheck labs tomorrow.  Hypokalemia Supplemented.    Insulin-dependent diabetes mellitus HbA1c was 7.4 in April 2024.  Long-acting insulin currently on hold due to low glucose levels.  Continue just SSI for now.    Chronic diastolic CHF/essential hypertension Holding diuretics for now due to hypovolemic status. No blood pressures noted.  Holding her antihypertensives for now.  Normocytic anemia Likely due to chronic disease.  No evidence for overt bleeding.  History of DVT Continue Eliquis.  History of lupus and Sjogren's syndrome Continue Plaquenil.  History of depression and anxiety Continue with her home medications.  Hypothyroidism Continue levothyroxine  Obesity Estimated body mass index is 47.87 kg/m as calculated from the following:   Height as of 10/25/22: 5\' 8"  (1.727 m).   Weight as of this encounter: 142.8 kg.   DVT Prophylaxis: On apixaban Code Status: Full code Family Communication: Discussed with patient Disposition Plan: Hopefully return home when improved    Medications: Scheduled:  apixaban  2.5 mg Oral BID   ARIPiprazole  5 mg Oral Daily   buPROPion  300 mg Oral Daily   [START ON 05/01/2023] carvedilol  12.5 mg Oral BID WC   metoCLOPramide  10 mg Oral TID AC   And   diphenhydrAMINE  12.5 mg Intravenous TID AC   feeding supplement  1 Container Oral QID   hydroxychloroquine  200 mg Oral BID   insulin aspart  0-15 Units Subcutaneous TID WC   insulin aspart  0-5 Units Subcutaneous QHS   [START ON 05/01/2023] isosorbide-hydrALAZINE  1 tablet Oral TID   levothyroxine  50 mcg Oral Q0600   pantoprazole (PROTONIX) IV  40 mg Intravenous Q12H   pneumococcal 20-valent conjugate vaccine  0.5 mL Intramuscular Tomorrow-1000   pregabalin  75 mg Oral TID   QUEtiapine  300 mg Oral QHS   rosuvastatin  20 mg Oral Daily   sodium chloride flush  3 mL Intravenous Q12H   sucralfate  1 g Oral TID WC & HS   topiramate  75 mg Oral QHS   Continuous:   sodium chloride 100 mL/hr at 04/30/23 1191    YNW:GNFAOZHYQMVHQ **OR** acetaminophen, albuterol, ALPRAZolam, HYDROmorphone (DILAUDID) injection, ondansetron **OR** ondansetron (ZOFRAN) IV, mouth rinse, prochlorperazine  Antibiotics: Anti-infectives (From admission, onward)    Start     Dose/Rate Route Frequency Ordered Stop   04/25/23 1000  hydroxychloroquine (PLAQUENIL) tablet 200 mg        200 mg Oral 2 times daily 04/25/23 0441         Objective:  Vital Signs  Vitals:   04/29/23 1625 04/29/23 2025 04/30/23 0514 04/30/23 0828  BP: (!) 97/52 (!) 101/47 (!) 86/50 (!) 120/59  Pulse: 76 81 86 87  Resp: 18 (!) 22 19   Temp: 98 F (36.7 C) 98.1 F (36.7 C) 98.6 F (37 C) 98 F (36.7 C)  TempSrc:  Oral Oral Oral  SpO2: 94% 100% 100% 100%  Weight:        Intake/Output Summary (Last 24 hours) at 04/30/2023 1017 Last data filed at 04/30/2023 0900 Gross per 24 hour  Intake 1320 ml  Output 0 ml  Net 1320 ml   Filed Weights   04/27/23 0552 04/28/23 0500  Weight: (!) 140.3 kg (!) 142.8 kg   General appearance: Awake alert.  In no distress Resp: Clear to auscultation bilaterally.  Normal effort Cardio: S1-S2 is normal regular.  No S3-S4.  No rubs murmurs or bruit GI: Abdomen is soft.  Nontender nondistended.  Bowel sounds are present normal.  No masses organomegaly Extremities: No edema.    Lab Results:  Data Reviewed: I have personally reviewed following labs and reports of the imaging studies  CBC: Recent Labs  Lab 04/24/23 2249 04/25/23 0713 04/26/23 0431 04/27/23 0539 04/28/23 1037  WBC 9.7 9.1 8.0 8.4 6.6  HGB 10.2* 9.9* 9.5* 9.6* 9.2*  HCT 31.9* 30.8* 30.5* 31.1* 29.4*  MCV 86.9 88.0 87.1 86.9 87.8  PLT 215 208 198 172 162    Basic Metabolic Panel: Recent Labs  Lab 04/25/23 0121 04/25/23 1004 04/26/23 0431 04/27/23 0539 04/28/23 1037 04/29/23 0734 04/30/23 0451  NA  --  136 137 136 138 135 134*  K  --  3.2* 4.0 3.4* 3.5 3.6 4.1  CL  --  103  107 109 107 107 108  CO2  --  21* 22 22 25  20* 17*  GLUCOSE  --  135* 81 104* 125* 155* 154*  BUN  --  8 7 5* <5* <5* 10  CREATININE  --  0.84 0.84 0.91 1.08* 1.06* 1.20*  CALCIUM  --  8.3* 8.5* 8.2* 8.5* 8.2* 7.9*  MG 1.7 1.7  --  1.8 1.6* 1.8  --     GFR: CrCl cannot be calculated (Unknown ideal weight.).  Liver Function Tests: Recent Labs  Lab 04/24/23 2249  AST 12*  ALT 8  ALKPHOS 61  BILITOT 1.1  PROT 6.6  ALBUMIN 2.9*    Recent Labs  Lab 04/24/23 2249  LIPASE 24    CBG: Recent Labs  Lab 04/29/23 0730 04/29/23 1153 04/29/23  1624 04/29/23 2024 04/30/23 0738  GLUCAP 165* 134* 155* 170* 135*    Radiology Studies: No results found.     LOS: 4 days   Wilhemenia Camba [employer] on [email or web address]  04/30/2023, 10:17 AM

## 2023-04-30 NOTE — Plan of Care (Signed)
  Problem: Education: Goal: Individualized Educational Video(s) Outcome: Progressing   Problem: Coping: Goal: Ability to adjust to condition or change in health will improve Outcome: Progressing   Problem: Health Behavior/Discharge Planning: Goal: Ability to identify and utilize available resources and services will improve Outcome: Progressing Goal: Ability to manage health-related needs will improve Outcome: Progressing   Problem: Metabolic: Goal: Ability to maintain appropriate glucose levels will improve Outcome: Progressing   Problem: Nutritional: Goal: Maintenance of adequate nutrition will improve Outcome: Progressing Goal: Progress toward achieving an optimal weight will improve Outcome: Progressing   Problem: Skin Integrity: Goal: Risk for impaired skin integrity will decrease Outcome: Progressing   Problem: Tissue Perfusion: Goal: Adequacy of tissue perfusion will improve Outcome: Progressing   Problem: Education: Goal: Knowledge of General Education information will improve Description: Including pain rating scale, medication(s)/side effects and non-pharmacologic comfort measures Outcome: Progressing   Problem: Health Behavior/Discharge Planning: Goal: Ability to manage health-related needs will improve Outcome: Progressing   Problem: Clinical Measurements: Goal: Ability to maintain clinical measurements within normal limits will improve Outcome: Progressing Goal: Will remain free from infection Outcome: Progressing Goal: Diagnostic test results will improve Outcome: Progressing Goal: Respiratory complications will improve Outcome: Progressing Goal: Cardiovascular complication will be avoided Outcome: Progressing   Problem: Activity: Goal: Risk for activity intolerance will decrease Outcome: Progressing   Problem: Nutrition: Goal: Adequate nutrition will be maintained Outcome: Progressing   Problem: Coping: Goal: Level of anxiety will  decrease Outcome: Progressing   Problem: Elimination: Goal: Will not experience complications related to bowel motility Outcome: Progressing Goal: Will not experience complications related to urinary retention Outcome: Progressing   Problem: Pain Managment: Goal: General experience of comfort will improve Outcome: Progressing   Problem: Safety: Goal: Ability to remain free from injury will improve Outcome: Progressing   Problem: Skin Integrity: Goal: Risk for impaired skin integrity will decrease Outcome: Progressing

## 2023-05-01 DIAGNOSIS — I10 Essential (primary) hypertension: Secondary | ICD-10-CM | POA: Diagnosis not present

## 2023-05-01 DIAGNOSIS — R112 Nausea with vomiting, unspecified: Secondary | ICD-10-CM | POA: Diagnosis not present

## 2023-05-01 DIAGNOSIS — N179 Acute kidney failure, unspecified: Secondary | ICD-10-CM | POA: Diagnosis not present

## 2023-05-01 DIAGNOSIS — F419 Anxiety disorder, unspecified: Secondary | ICD-10-CM | POA: Diagnosis not present

## 2023-05-01 LAB — BASIC METABOLIC PANEL
Anion gap: 4 — ABNORMAL LOW (ref 5–15)
BUN: 10 mg/dL (ref 6–20)
CO2: 22 mmol/L (ref 22–32)
Calcium: 8 mg/dL — ABNORMAL LOW (ref 8.9–10.3)
Chloride: 110 mmol/L (ref 98–111)
Creatinine, Ser: 0.96 mg/dL (ref 0.44–1.00)
GFR, Estimated: >60 mL/min (ref >60)
Glucose, Bld: 174 mg/dL — ABNORMAL HIGH (ref 70–99)
Potassium: 4.2 mmol/L (ref 3.5–5.1)
Sodium: 136 mmol/L (ref 135–145)

## 2023-05-01 LAB — GLUCOSE, CAPILLARY
Glucose-Capillary: 176 mg/dL — ABNORMAL HIGH (ref 70–99)
Glucose-Capillary: 157 mg/dL — ABNORMAL HIGH (ref 70–99)
Glucose-Capillary: 198 mg/dL — ABNORMAL HIGH (ref 70–99)
Glucose-Capillary: 185 mg/dL — ABNORMAL HIGH (ref 70–99)

## 2023-05-01 MED ORDER — DIPHENHYDRAMINE HCL 50 MG PO TABS: 25.0000 mg | ORAL_TABLET | Freq: Four times a day (QID) | ORAL | 0 refills | Status: DC | PRN

## 2023-05-01 MED ORDER — HYDROMORPHONE HCL 1 MG/ML IJ SOLN
0.5000 mg | Freq: Once | INTRAMUSCULAR | Status: AC
  Administered 2023-05-01: 0.5 mg via INTRAVENOUS
  Filled 2023-05-01: qty 1

## 2023-05-01 MED ORDER — CARVEDILOL 12.5 MG PO TABS: 12.5000 mg | ORAL_TABLET | Freq: Two times a day (BID) | ORAL | Status: DC

## 2023-05-01 MED ORDER — METOCLOPRAMIDE HCL 10 MG PO TABS: 10.0000 mg | ORAL_TABLET | Freq: Three times a day (TID) | ORAL | 0 refills | Status: DC

## 2023-05-01 MED ORDER — MECLIZINE HCL 25 MG PO TABS: 25.0000 mg | ORAL_TABLET | Freq: Three times a day (TID) | ORAL | Status: DC | PRN

## 2023-05-01 MED ORDER — TRAMADOL HCL 50 MG PO TABS: 50.0000 mg | ORAL_TABLET | Freq: Four times a day (QID) | ORAL | 0 refills | Status: DC | PRN

## 2023-05-01 MED ORDER — TRAMADOL HCL 50 MG PO TABS
50.0000 mg | ORAL_TABLET | Freq: Four times a day (QID) | ORAL | Status: DC | PRN
  Administered 2023-05-01 – 2023-05-03 (×3): 50 mg via ORAL
  Filled 2023-05-01 (×3): qty 1

## 2023-05-01 NOTE — Progress Notes (Signed)
Physical Therapy Treatment Patient Details Name: [name] MRN: [id number] DOB: March 25, 1971 Today's Date: 05/01/2023   History of Present Illness Pt is 52 year old presented to [facility] on  04/24/23 for abdominal pain and intractable nausea and vomiting. CT without acute findings. PMH - DM, lupus, morbid obesity  systolic/diastolic CHF, chronic right ventricular failure, history of DVT, hypothyroidism, depression and anxiety and chronic abdominal pain.    PT Comments  PT returns for further vestibular assessment. Pt reports symptoms of the room spinning around her when performing bed mobility and transfers. PT does not note nystagmus during session, although this is difficult to fully assess as pt often closes eyes and has limited L eye opening chronically. PT notes slowed pursuits and VOR. Pt reports more symptoms with head roll test to left and later with dix hallpike to R side. PT provides further reinforcement of gaze stabilization strategies and provides VOR x1 exercise to aide in improving this. PT will follow up in an effort to improve mobility tolerance and to further assess symptoms of dizziness.   If plan is discharge home, recommend the following: A little help with walking and/or transfers;A little help with bathing/dressing/bathroom;Assistance with cooking/housework;Help with stairs or ramp for entrance;Assist for transportation   Can travel by private vehicle        Equipment Recommendations  None recommended by PT    Recommendations for Other Services       Precautions / Restrictions Precautions Precautions: Fall Precaution Comments: Reporting dizziness with getting to sitting and standing Restrictions Weight Bearing Restrictions: No     Mobility  Bed Mobility Overal bed mobility: Needs Assistance Bed Mobility: Rolling, Sidelying to Sit, Sit to Supine Rolling: Supervision Sidelying to sit: Supervision, Used rails, HOB elevated   Sit to supine: Supervision   General  bed mobility comments: reinforced education on gaze stabilization with bed mobility, pt does not appear to follow through with this well    Transfers Overall transfer level: Needs assistance Equipment used: Rollator (4 wheels) Transfers: Sit to/from Stand Sit to Stand: Contact guard assist           General transfer comment: increased time, pt reports significant dizziness immediately after initiating transfer    Ambulation/Gait             Pre-gait activities: unable to ambulate due to reports of dizziness/lightheadedness     Stairs             Wheelchair Mobility     Tilt Bed    Modified Rankin (Stroke Patients Only)       Balance Overall balance assessment: Needs assistance Sitting-balance support: No upper extremity supported, Feet supported Sitting balance-Leahy Scale: Good     Standing balance support: Bilateral upper extremity supported, Reliant on assistive device for balance Standing balance-Leahy Scale: Poor                              Cognition Arousal: Alert Behavior During Therapy: WFL for tasks assessed/performed Overall Cognitive Status: Within Functional Limits for tasks assessed                                          Exercises Other Exercises Other Exercises: PT provides education on VOR x1 horizontal and vertical exercise    General Comments General comments (skin integrity, edema, etc.): PT vestibular assessment:  slowed pursuits, pt reports dizziness with horizontal VOR, denies symptoms with vertical VOR although with minimal ROM despite PT encouragement for larger head movements. Head roll test positive to left side for symptoms of dizziness but negative to R. Dix hallpike with symptoms reproduction to R side but negative to left. Pt reports dizziness with bed mobility and transfers. Of note pt does wear glasses but does not have them as they were not brought with her to the hospital. BP 125/75 in  sitting after standing attempt.      Pertinent Vitals/Pain Pain Assessment Pain Assessment: Faces Faces Pain Scale: Hurts even more Pain Location: head Pain Descriptors / Indicators: Headache Pain Intervention(s): Monitored during session    Home Living Family/patient expects to be discharged to:: Private residence Living Arrangements: Alone Available Help at Discharge: Family;Available PRN/intermittently;Personal care attendant Type of Home: House Home Access: Stairs to enter Entrance Stairs-Rails: Estate agent of Steps: 7   Home Layout: One level   Additional Comments: Aide 7 days/wk, 2.5 hours/day    Prior Function            PT Goals (current goals can now be found in the care plan section) Acute Rehab PT Goals Patient Stated Goal: Wants to feel more confident in moving around to go home Progress towards PT goals: Not progressing toward goals - comment (limited by dizziness)    Frequency    Min 1X/week      PT Plan      Co-evaluation              AM-PAC PT "6 Clicks" Mobility   Outcome Measure  Help needed turning from your back to your side while in a flat bed without using bedrails?: None Help needed moving from lying on your back to sitting on the side of a flat bed without using bedrails?: A Little Help needed moving to and from a bed to a chair (including a wheelchair)?: A Little Help needed standing up from a chair using your arms (e.g., wheelchair or bedside chair)?: A Little Help needed to walk in hospital room?: Total Help needed climbing 3-5 steps with a railing? : Total 6 Click Score: 15    End of Session   Activity Tolerance: Treatment limited secondary to medical complications (Comment) (dizziness) Patient left: in bed;with call bell/phone within reach;with bed alarm set Nurse Communication: Mobility status PT Visit Diagnosis: Other abnormalities of gait and mobility (R26.89);Muscle weakness (generalized)  (M62.81);Dizziness and giddiness (R42)     Time: 1610-9604 PT Time Calculation (min) (ACUTE ONLY): 33 min  Charges:    $Therapeutic Exercise: 8-22 mins $Therapeutic Activity: 8-22 mins PT General Charges $$ ACUTE PT VISIT: 1 Visit                     Arlyss Gandy, PT, DPT Acute Rehabilitation Office [phone]    Arlyss Gandy 05/01/2023, 3:39 PM

## 2023-05-01 NOTE — Progress Notes (Signed)
Occupational Therapy Treatment Patient Details Name: [name] MRN: [id number] DOB: Jul 07, 1971 Today's Date: 05/01/2023   History of present illness Pt is 52 year old presented to [facility] on  04/24/23 for abdominal pain and intractable nausea and vomiting. CT without acute findings. PMH - DM, lupus, morbid obesity  systolic/diastolic CHF, chronic right ventricular failure, history of DVT, hypothyroidism, depression and anxiety and chronic abdominal pain.   OT comments  Pt with slow progression towards goals, limited by dizziness/lightheadedness this session. Pt able to sit EOB for 1 ADL and visual assessment. Pt reports vertical diplopia, does not disappear with either eye occluded, states is only present with headache/lightheadedness. Educated pt on compensatory strategies for bed mobility, rolling to sidelying before sitting up and reviewed gaze stabilization exercises when sitting EOB prior to transfers. Pt verbalized understanding. Pt presenting with impairments listed below, will follow acutely. Patient will benefit from intensive inpatient follow up therapy, >3 hours/day to maximize safety/ind with ADLs/functional mobility.       If plan is discharge home, recommend the following:  A little help with bathing/dressing/bathroom;Assistance with cooking/housework;A little help with walking and/or transfers;Help with stairs or ramp for entrance   Equipment Recommendations       Recommendations for Other Services PT consult;Rehab consult    Precautions / Restrictions Precautions Precautions: Fall Precaution Comments: Reporting dizziness with getting to sitting and standing Restrictions Weight Bearing Restrictions: No       Mobility Bed Mobility Overal bed mobility: Needs Assistance Bed Mobility: Sit to Sidelying, Sidelying to Sit   Sidelying to sit: Contact guard assist     Sit to sidelying: Contact guard assist General bed mobility comments: educated on rolling sidelying prior  to sitting to improve dizziness    Transfers                         Balance     Sitting balance-Leahy Scale: Good                                     ADL either performed or assessed with clinical judgement   ADL Overall ADL's : Needs assistance/impaired     Grooming: Wash/dry face;Sitting;Bed level               Lower Body Dressing: Moderate assistance Lower Body Dressing Details (indicate cue type and reason): due to dizziness                    Extremity/Trunk Assessment Upper Extremity Assessment Upper Extremity Assessment: Generalized weakness   Lower Extremity Assessment Lower Extremity Assessment: Defer to PT evaluation        Vision   Additional Comments: reports double vision, and blurred vision, does not resolve with 1 eye closed reports vertical diplopia, reports improvement with gaze stabilization   Perception Perception Perception: Not tested   Praxis Praxis Praxis: Not tested    Cognition Arousal: Alert Behavior During Therapy: [facility] for tasks assessed/performed Overall Cognitive Status: Within Functional Limits for tasks assessed                                          Exercises      Shoulder Instructions       General Comments VSS on RA    Pertinent Vitals/  Pain       Pain Assessment Pain Assessment: Faces Pain Score: 6  Faces Pain Scale: Hurts even more Pain Location: head/headache Pain Descriptors / Indicators: Headache Pain Intervention(s): Limited activity within patient's tolerance, Monitored during session  Home Living Family/patient expects to be discharged to:: Private residence Living Arrangements: Alone Available Help at Discharge: Family;Available PRN/intermittently;Personal care attendant Type of Home: House Home Access: Stairs to enter [employer] of Steps: 7 Entrance Stairs-Rails: None;Right;Left Home Layout: One level     Bathroom Shower/Tub:  Tub/shower unit             Additional Comments: Aide 7 days/wk, 2.5 hours/day      Prior Functioning/Environment              Frequency  Min 1X/week        Progress Toward Goals  OT Goals(current goals can now be found in the care plan section)  Progress towards OT goals: Progressing toward goals  Acute Rehab OT Goals Patient Stated Goal: none stated OT Goal Formulation: With patient Time For Goal Achievement: 05/11/23 Potential to Achieve Goals: Good ADL Goals Pt Will Perform Grooming: with supervision;standing Pt Will Perform Lower Body Bathing: with set-up;with supervision;with adaptive equipment;sitting/lateral leans Pt Will Transfer to Toilet: with supervision;ambulating Pt Will Perform Tub/Shower Transfer: with modified independence;Tub transfer;tub bench  Plan      Co-evaluation                 AM-PAC OT "6 Clicks" Daily Activity     Outcome Measure   Help from another person eating meals?: None Help from another person taking care of personal grooming?: A Little Help from another person toileting, which includes using toliet, bedpan, or urinal?: A Little Help from another person bathing (including washing, rinsing, drying)?: A Lot Help from another person to put on and taking off regular upper body clothing?: A Little Help from another person to put on and taking off regular lower body clothing?: A Lot 6 Click Score: 17    End of Session    OT Visit Diagnosis: Muscle weakness (generalized) (M62.81);Other abnormalities of gait and mobility (R26.89)   Activity Tolerance Patient tolerated treatment well   Patient Left in bed;with call bell/phone within reach   Nurse Communication Mobility status        Time: 1610-9604 OT Time Calculation (min): 22 min  Charges: OT General Charges $OT Visit: 1 Visit OT Treatments $Therapeutic Activity: 8-22 mins  [name] Fila, OTD, OTR/L [employer] Preferred Acute Rehab [phone]   [name] 05/01/2023, 12:47 PM

## 2023-05-01 NOTE — Progress Notes (Signed)
Physical Therapy Treatment Patient Details Name: [name] MRN: [id number] DOB: [birth year] Today's Date: 05/01/2023   History of Present Illness Pt is 52 year old presented to [facility] on  04/24/23 for abdominal pain and intractable nausea and vomiting. CT without acute findings. PMH - DM, lupus, morbid obesity  systolic/diastolic CHF, chronic right ventricular failure, history of DVT, hypothyroidism, depression and anxiety and chronic abdominal pain.    PT Comments  Continuing work on functional mobility and activity tolerance;  Session focused on activity tolerance, and assessment of BP response to getting up and moving; Noteworthy that pt's BPs did not drop in response to standing up, however Dizziness persisted in standing, and with further questioning, pt described the sensation of "the room spinning"; Tested VOR with x1 exercises and noted lots of difficulty, especially with head turning to her Right; difficulty with quick eye movements between visual targets; reported double vision as well; Posted visual targets on walls of pt's room to give her a point for gaze fixation when dizziness occurs;   I'm wondering if vestibular dysfunction is a main player in her dizziness, nausea, and vomiting; Discussed with Dr. Rito Ehrlich, who gave the verbal order for a Vestibular Evaluation, and requested another night in hospital acutely;   Updating dc recs to an AIR stay; [name] is participating and trying hard with PT; she is nervous about getting home, and is motivated to move better, decr risk of falls, and to be more confident at home; she lives alone, but has an Aide that comes 5-7 days per week, and her sister is local and supportive; Will ask Rehab Admissions Coord to weigh-in re: the possibility of an AIR stay to maximize independence and safety with mobility and ADLs   If plan is discharge home, recommend the following: Assistance with cooking/housework   Can travel by private vehicle         Equipment Recommendations  None recommended by PT    Recommendations for Other Services Rehab consult     Precautions / Restrictions Precautions Precautions: Fall Precaution Comments: Reporting dizziness with getting to sitting and standing Restrictions Weight Bearing Restrictions: No     Mobility  Bed Mobility Overal bed mobility: Needs Assistance Bed Mobility: Supine to Sit     Supine to sit: Supervision     General bed mobility comments: slow moving, and reports dizziness upon sitting up    Transfers Overall transfer level: Needs assistance Equipment used: Rollator (4 wheels) Transfers: Sit to/from Stand Sit to Stand: Contact guard assist           General transfer comment: Stood from slightly elevated bed (to approximate home) to Rollator RW; close guard secondary to dizziness, noting pt dependent on UEs on Rollator for stability; VERY uncomfortable in standing, but stood long enough for standing BP    Ambulation/Gait Ambulation/Gait assistance: Editor, commissioning (Feet):  (a few steps marching in place at bedside; heavy dependence on UE support) Assistive device: Rollator (4 wheels)             Stairs             Wheelchair Mobility     Tilt Bed    Modified Rankin (Stroke Patients Only)       Balance     Sitting balance-Leahy Scale: Good       Standing balance-Leahy Scale: Poor  Cognition Arousal: Alert Behavior During Therapy: WFL for tasks assessed/performed Overall Cognitive Status: Within Functional Limits for tasks assessed                                          Exercises      General Comments General comments (skin integrity, edema, etc.): Took sitting and standing BPs, no SBP drop in standing (see vitals flowsheets); Dizziness persisted in standing; Tested VOR with x1 exercises and noted lots of difficulty, especially with head turning to her Right;  difficulty with quick eye movements between visual targets; reported double vision as well      Pertinent Vitals/Pain Pain Assessment Pain Assessment: Faces Faces Pain Scale: Hurts even more Pain Location: Headache (post VOR testing) (discomfort in abdomen as well) Pain Descriptors / Indicators: Headache Pain Intervention(s): Monitored during session    Home Living                          Prior Function            PT Goals (current goals can now be found in the care plan section) Acute Rehab PT Goals Patient Stated Goal: Wants to feel more confident in moving around to go home PT Goal Formulation: With patient Time For Goal Achievement: 05/11/23 Potential to Achieve Goals: Good Progress towards PT goals: Progressing toward goals (difficulty today due to dizziness)    Frequency    Min 1X/week      PT Plan      Co-evaluation              AM-PAC PT "6 Clicks" Mobility   Outcome Measure  Help needed turning from your back to your side while in a flat bed without using bedrails?: None Help needed moving from lying on your back to sitting on the side of a flat bed without using bedrails?: A Little Help needed moving to and from a bed to a chair (including a wheelchair)?: A Little Help needed standing up from a chair using your arms (e.g., wheelchair or bedside chair)?: A Little Help needed to walk in hospital room?: Total Help needed climbing 3-5 steps with a railing? : Total 6 Click Score: 15    End of Session   Activity Tolerance: Other (comment) (limited by dizziness, room spinning) Patient left: in bed;with call bell/phone within reach;Other (comment) (sitting EOB) Nurse Communication: Mobility status PT Visit Diagnosis: Other abnormalities of gait and mobility (R26.89);Muscle weakness (generalized) (M62.81);Dizziness and giddiness (R42)     Time: 5366-4403 PT Time Calculation (min) (ACUTE ONLY): 33 min  Charges:    $Therapeutic Activity:  23-37 mins PT General Charges $$ ACUTE PT VISIT: 1 Visit                     Van Clines, PT  Acute Rehabilitation Services Office [phone] Secure Chat welcomed    [name] 05/01/2023, 12:10 PM

## 2023-05-01 NOTE — TOC Progression Note (Signed)
Transition of Care [facility]) - Progression Note    Patient Details  Name: [name] MRN: [id number] Date of Birth: 12/26/1970  [facility] CM/SW Contact  Tom-Johnson, Hershal Coria, RN Phone Number: 05/01/2023, 4:46 PM  Clinical Narrative:     Disposition recommendation changed to AIR and they are following for possible admit.   CM will continue to follow.        Expected Discharge Plan and Services                                               Social Determinants of Health (SDOH) Interventions SDOH Screenings   Food Insecurity: No Food Insecurity (04/25/2023)  Housing: Low Risk  (04/25/2023)  Transportation Needs: No Transportation Needs (04/25/2023)  Utilities: Not At Risk (04/25/2023)  Financial Resource Strain: Low Risk  (03/30/2022)   Received from [facility], [facility] visits prior to 10/01/2022., [facility] visits prior to 10/01/2022.  Social Connections: Unknown (12/11/2022)   Received from [facility], [facility]  Stress: No Stress Concern Present (01/04/2023)   Received from [facility], [facility]  Tobacco Use: Medium Risk (04/25/2023)    Readmission Risk Interventions    04/27/2023    3:50 PM 05/26/2021    4:02 PM  Readmission Risk Prevention Plan  Transportation Screening Complete Complete  PCP or Specialist Appt within 3-5 Days Complete   HRI or Home Care Consult Complete   Social Work Consult for Recovery Care Planning/Counseling Complete   Palliative Care Screening Not Applicable   Medication Review Oceanographer) Referral to Pharmacy   PCP or Specialist appointment within 3-5 days of discharge  Complete  HRI or Home Care Consult  Complete  SW Recovery Care/Counseling Consult  Complete  Skilled Nursing Facility  Not Applicable

## 2023-05-01 NOTE — Progress Notes (Signed)
Inpatient Rehab Admissions Coordinator:   Per therapy recommendations, patient was screened for CIR candidacy by Megan Salon, MS, CCC-SLP. At this time, Pt. Appears to be a a potential candidate for CIR. Case was also reviewed with rehab MD.  I will place   order for rehab consult per protocol for full assessment. Please contact me any with questions.  Megan Salon, MS, CCC-SLP Rehab Admissions Coordinator  [phone] (celll) [phone] (office)

## 2023-05-01 NOTE — Progress Notes (Signed)
[facility] PROGRESS NOTE   [name] UUV:[id number] DOB: 08/09/1970 DOA: 04/24/2023  PCP: Verlon Au, MD  Brief History: 52 y.o. female with medical history significant for insulin-dependent diabetes mellitus, chronic HFpEF, history of DVT on Eliquis, hypothyroidism, depression, anxiety, lupus, Sjogren syndrome, and BMI 55 who presented to the emergency department with upper abdominal pain, nausea, and vomiting.  According to the patient's symptoms have been ongoing for about 2 weeks.  She underwent CT scan of the abdomen and pelvis which did not show any acute findings.  She was hospitalized for further management.  Of note she has had a normal gastric emptying study in 2021.    Consultants: None  Procedures: None yet    Subjective/Interval History: Continues to have some fatigue.  Dysuria is stable.  No nausea vomiting.  Tolerating her diet.  Assessment/Plan:  Intractable nausea and vomiting possibly due to diabetic gastroparesis CT was reassuring.  LFTs were normal.  Lipase was normal.  She does have diabetes so diabetic gastroparesis is likely although she did have a negative gastric emptying study in 2021.  Viral illness is also a possibility.   It looks like her last upper endoscopy was done in June 2024 at [facility].  Apparently this showed no significant findings.  Hiatal hernia was noted. There was no improvement with Zofran.  Patient was subsequently given Reglan with Benadryl at her request.  No side effects have been noted.  Continue with PPI and Carafate.   Patient's nausea and vomiting has subsided.  Reglan was changed over to oral.  He may continue Benadryl as well.  Urinary tract infection Patient complained of dysuria yesterday.  UA was positive for UTI.  Urine culture is pending.  Continue ceftriaxone for now.  Acute kidney injury Likely due to hypovolemia.  Resolved with IV fluids.   Hypokalemia Supplemented.    Insulin-dependent diabetes  mellitus HbA1c was 7.4 in April 2024.  Long-acting insulin currently on hold due to low glucose levels.  Continue just SSI for now.    Chronic diastolic CHF/essential hypertension Holding diuretics for now due to hypovolemic status. No blood pressures noted.  Holding her antihypertensives for now.  Normocytic anemia Likely due to chronic disease.  No evidence for overt bleeding.  History of DVT Continue Eliquis.  History of lupus and Sjogren's syndrome Continue Plaquenil.  History of depression and anxiety Continue with her home medications.  Hypothyroidism Continue levothyroxine  Physical deconditioning Seen by physical therapy today.  They noticed patient was having some vestibular symptoms.  Continue PT and OT.  Vestibular PT.  Obesity Estimated body mass index is 47.87 kg/m as calculated from the following:   Height as of 10/25/22: 5\' 8"  (1.727 m).   Weight as of this encounter: 142.8 kg.   DVT Prophylaxis: On apixaban Code Status: Full code Family Communication: Discussed with patient Disposition Plan: Hopefully return home when improved    Medications: Scheduled:  apixaban  2.5 mg Oral BID   ARIPiprazole  5 mg Oral Daily   buPROPion  300 mg Oral Daily   [START ON 05/02/2023] carvedilol  12.5 mg Oral BID WC   metoCLOPramide  10 mg Oral TID AC   And   diphenhydrAMINE  12.5 mg Intravenous TID AC   feeding supplement  1 Container Oral QID   hydroxychloroquine  200 mg Oral BID   insulin aspart  0-15 Units Subcutaneous TID WC   insulin aspart  0-5 Units Subcutaneous QHS   levothyroxine  50 mcg  Oral Q0600   pantoprazole (PROTONIX) IV  40 mg Intravenous Q12H   pneumococcal 20-valent conjugate vaccine  0.5 mL Intramuscular Tomorrow-1000   pregabalin  75 mg Oral TID   QUEtiapine  300 mg Oral QHS   rosuvastatin  20 mg Oral Daily   sodium bicarbonate  1,300 mg Oral BID   sodium chloride flush  3 mL Intravenous Q12H   sucralfate  1 g Oral TID WC & HS   topiramate   75 mg Oral QHS   Continuous:  cefTRIAXone (ROCEPHIN)  IV 2 g (04/30/23 1651)    ZOX:[id number] **OR** acetaminophen, albuterol, ALPRAZolam, ondansetron **OR** ondansetron (ZOFRAN) IV, mouth rinse, prochlorperazine, traMADol  Antibiotics: Anti-infectives (From admission, onward)    Start     Dose/Rate Route Frequency Ordered Stop   04/30/23 1500  cefTRIAXone (ROCEPHIN) 2 g in sodium chloride 0.9 % 100 mL IVPB        2 g 200 mL/hr over 30 Minutes Intravenous Every 24 hours 04/30/23 1351     04/25/23 1000  hydroxychloroquine (PLAQUENIL) tablet 200 mg        200 mg Oral 2 times daily 04/25/23 0441         Objective:  Vital Signs  Vitals:   04/30/23 2049 05/01/23 0432 05/01/23 0823 05/01/23 1000  BP: (!) 132/51 (!) 100/51 117/69 (!) 112/59  Pulse: 91 92 98   Resp: 18 20 19    Temp: 98.5 F (36.9 C) 98 F (36.7 C)    TempSrc: Oral Oral Oral   SpO2: 100% 100% 100%   Weight:        Intake/Output Summary (Last 24 hours) at 05/01/2023 1059 Last data filed at 05/01/2023 0900 Gross per 24 hour  Intake 2598.01 ml  Output 0 ml  Net 2598.01 ml   Filed Weights   04/27/23 0552 04/28/23 0500  Weight: (!) 140.3 kg (!) 142.8 kg   General appearance: Awake alert.  In no distress Resp: Clear to auscultation bilaterally.  Normal effort Cardio: S1-S2 is normal regular.  No S3-S4.  No rubs murmurs or bruit GI: Abdomen is soft.  Nontender nondistended.  Bowel sounds are present normal.  No masses organomegaly Extremities: No edema.    Lab Results:  Data Reviewed: I have personally reviewed following labs and reports of the imaging studies  CBC: Recent Labs  Lab 04/24/23 2249 04/25/23 0713 04/26/23 0431 04/27/23 0539 04/28/23 1037  WBC 9.7 9.1 8.0 8.4 6.6  HGB 10.2* 9.9* 9.5* 9.6* 9.2*  HCT 31.9* 30.8* 30.5* 31.1* 29.4*  MCV 86.9 88.0 87.1 86.9 87.8  PLT 215 208 198 172 162    Basic Metabolic Panel: Recent Labs  Lab 04/25/23 0121 04/25/23 1004 04/26/23 0431  04/27/23 0539 04/28/23 1037 04/29/23 0734 04/30/23 0451 05/01/23 0554  NA  --  136   < > 136 138 135 134* 136  K  --  3.2*   < > 3.4* 3.5 3.6 4.1 4.2  CL  --  103   < > 109 107 107 108 110  CO2  --  21*   < > 22 25 20* 17* 22  GLUCOSE  --  135*   < > 104* 125* 155* 154* 174*  BUN  --  8   < > 5* <5* <5* 10 10  CREATININE  --  0.84   < > 0.91 1.08* 1.06* 1.20* 0.96  CALCIUM  --  8.3*   < > 8.2* 8.5* 8.2* 7.9* 8.0*  MG 1.7 1.7  --  1.8 1.6* 1.8  --   --    < > = values in this interval not displayed.    GFR: CrCl cannot be calculated (Unknown ideal weight.).  Liver Function Tests: Recent Labs  Lab 04/24/23 2249  AST 12*  ALT 8  ALKPHOS 61  BILITOT 1.1  PROT 6.6  ALBUMIN 2.9*    Recent Labs  Lab 04/24/23 2249  LIPASE 24    CBG: Recent Labs  Lab 04/30/23 0738 04/30/23 1116 04/30/23 1624 04/30/23 2105 05/01/23 0738  GLUCAP 135* 142* 173* 165* 176*    Radiology Studies: No results found.     LOS: 5 days   Shiann Kam [employer] on [email or web address]  05/01/2023, 10:59 AM

## 2023-05-02 DIAGNOSIS — F419 Anxiety disorder, unspecified: Secondary | ICD-10-CM | POA: Diagnosis not present

## 2023-05-02 DIAGNOSIS — R42 Dizziness and giddiness: Secondary | ICD-10-CM | POA: Diagnosis not present

## 2023-05-02 DIAGNOSIS — I10 Essential (primary) hypertension: Secondary | ICD-10-CM | POA: Diagnosis not present

## 2023-05-02 DIAGNOSIS — N179 Acute kidney failure, unspecified: Secondary | ICD-10-CM | POA: Diagnosis not present

## 2023-05-02 LAB — URINE CULTURE: Culture: >=100000 — AB

## 2023-05-02 LAB — GLUCOSE, CAPILLARY
Glucose-Capillary: 133 mg/dL — ABNORMAL HIGH (ref 70–99)
Glucose-Capillary: 158 mg/dL — ABNORMAL HIGH (ref 70–99)
Glucose-Capillary: 162 mg/dL — ABNORMAL HIGH (ref 70–99)
Glucose-Capillary: 201 mg/dL — ABNORMAL HIGH (ref 70–99)

## 2023-05-02 MED ORDER — CEPHALEXIN 500 MG PO CAPS
500.0000 mg | ORAL_CAPSULE | Freq: Two times a day (BID) | ORAL | Status: DC
  Administered 2023-05-02 – 2023-05-03 (×3): 500 mg via ORAL
  Filled 2023-05-02 (×3): qty 1

## 2023-05-02 MED ORDER — PANTOPRAZOLE SODIUM 40 MG PO TBEC
40.0000 mg | DELAYED_RELEASE_TABLET | Freq: Every day | ORAL | Status: DC
  Administered 2023-05-02 – 2023-05-03 (×2): 40 mg via ORAL
  Filled 2023-05-02 (×2): qty 1

## 2023-05-02 MED ORDER — INFLUENZA VIRUS VACC SPLIT PF (FLUZONE) 0.5 ML IM SUSY
0.5000 mL | PREFILLED_SYRINGE | INTRAMUSCULAR | Status: DC
  Filled 2023-05-02: qty 0.5

## 2023-05-02 MED ORDER — MECLIZINE HCL 25 MG PO TABS
12.5000 mg | ORAL_TABLET | Freq: Three times a day (TID) | ORAL | Status: DC
  Administered 2023-05-02 – 2023-05-03 (×4): 12.5 mg via ORAL
  Filled 2023-05-02 (×4): qty 1

## 2023-05-02 NOTE — Progress Notes (Signed)
Physical Therapy Treatment Patient Details Name: [name] MRN: [id number] DOB: [birth year] Today's Date: 05/02/2023   History of Present Illness Pt is 52 year old presented to [facility] on  04/24/23 for abdominal pain and intractable nausea and vomiting. CT without acute findings. PMH - DM, lupus, morbid obesity  systolic/diastolic CHF, chronic right ventricular failure, history of DVT, hypothyroidism, depression and anxiety and chronic abdominal pain.    PT Comments  Pt with improved tolerance for mobility this session. Pt reports significant improvement in headache and denies dizziness during this session. Pt is able to transfer and ambulate for very short household distances with support of a rollator. PT continues to encourage frequent mobilization to improve activity tolerance. PT also encourages VOR x1 exercise to aide in gaze stabilization. 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 PT updates recommendations based on improvement in mobility and patient preference.    If plan is discharge home, recommend the following: A little help with walking and/or transfers;A little help with bathing/dressing/bathroom;Assistance with cooking/housework;Help with stairs or ramp for entrance   Can travel by private vehicle        Equipment Recommendations  None recommended by PT    Recommendations for Other Services       Precautions / Restrictions Precautions Precautions: Fall Precaution Comments: dizziness/lightheadedness and HA with positional changes during this admission Restrictions Weight Bearing Restrictions: No     Mobility  Bed Mobility Overal bed mobility: Modified Independent Bed Mobility: Supine to Sit, Sit to Supine     Supine to sit: Modified independent (Device/Increase time), HOB elevated Sit to supine: Modified independent (Device/Increase time), HOB elevated   General bed  mobility comments: increased time, assist of UE at times to mobilize LLE    Transfers Overall transfer level: Needs assistance Equipment used: Rollator (4 wheels) Transfers: Sit to/from Stand Sit to Stand: Supervision                Ambulation/Gait Ambulation/Gait assistance: Supervision Gait Distance (Feet): 20 Feet (additional trial of 15') Assistive device: Rollator (4 wheels) Gait Pattern/deviations: Step-to pattern, Wide base of support, Decreased weight shift to left, Decreased stance time - left Gait velocity: reduced Gait velocity interpretation: <1.31 ft/sec, indicative of household ambulator   General Gait Details: pt with reduced stance time on LLE due to knee pain. This results in R foot drag.   Stairs             Wheelchair Mobility     Tilt Bed    Modified Rankin (Stroke Patients Only)       Balance Overall balance assessment: Needs assistance Sitting-balance support: No upper extremity supported, Feet supported Sitting balance-Leahy Scale: Good     Standing balance support: Single extremity supported, Reliant on assistive device for balance Standing balance-Leahy Scale: Poor                              Cognition Arousal: Alert Behavior During Therapy: WFL for tasks assessed/performed Overall Cognitive Status: Within Functional Limits for tasks assessed                                          Exercises Other Exercises Other Exercises: PT continues to encourage VOR x1 exercise to improve gaze stability  General Comments General comments (skin integrity, edema, etc.): tachy into 110s when mobilizing. Pt denies dizziness during session, reports no vertiginous symptoms today, only momentary lightheadedness with positional changes      Pertinent Vitals/Pain Pain Assessment Pain Assessment: Faces Faces Pain Scale: Hurts even more Pain Location: L knee Pain Descriptors / Indicators: Aching Pain  Intervention(s): Monitored during session    Home Living                          Prior Function            PT Goals (current goals can now be found in the care plan section) Acute Rehab PT Goals Patient Stated Goal: return home Progress towards PT goals: Progressing toward goals    Frequency    Min 1X/week      PT Plan      Co-evaluation              AM-PAC PT "6 Clicks" Mobility   Outcome Measure  Help needed turning from your back to your side while in a flat bed without using bedrails?: None Help needed moving from lying on your back to sitting on the side of a flat bed without using bedrails?: None Help needed moving to and from a bed to a chair (including a wheelchair)?: A Little Help needed standing up from a chair using your arms (e.g., wheelchair or bedside chair)?: A Little Help needed to walk in hospital room?: A Little Help needed climbing 3-5 steps with a railing? : Total 6 Click Score: 18    End of Session   Activity Tolerance: Patient limited by fatigue (fatigues quickly, limited endurance at baseline) Patient left: in bed;with call bell/phone within reach Nurse Communication: Mobility status PT Visit Diagnosis: Other abnormalities of gait and mobility (R26.89);Muscle weakness (generalized) (M62.81);Dizziness and giddiness (R42)     Time: 7829-5621 PT Time Calculation (min) (ACUTE ONLY): 23 min  Charges:    $Gait Training: 8-22 mins $Therapeutic Activity: 8-22 mins PT General Charges $$ ACUTE PT VISIT: 1 Visit                     Arlyss Gandy, PT, DPT Acute Rehabilitation Office [phone]    Arlyss Gandy 05/02/2023, 1:26 PM

## 2023-05-02 NOTE — Progress Notes (Signed)
Inpatient Rehab Admissions:  Inpatient Rehab Consult received.  I met with patient at the bedside for rehabilitation assessment and to discuss goals and expectations of an inpatient rehab admission.  Discussed average length of stay, insurance authorization requirement, and discharge home after completion of CIR. Pt acknowledged understanding. Pt interested in pursuing CIR. Pt gave permission to contact sister Isaac Laud. Called Teasha and no one answered. Left a message; awaiting return call. Will continue to follow.  Signed: Wolfgang Phoenix, MS, CCC-SLP Admissions Coordinator [phone]

## 2023-05-02 NOTE — Progress Notes (Addendum)
[facility] PROGRESS NOTE   [name]:[id number] DOB: 12/26/1970 DOA: 04/24/2023  PCP: Verlon Au, MD  Brief History: 52 y.o. female with medical history significant for insulin-dependent diabetes mellitus, chronic HFpEF, history of DVT on Eliquis, hypothyroidism, depression, anxiety, lupus, Sjogren syndrome, and BMI 55 who presented to the emergency department with upper abdominal pain, nausea, and vomiting.  According to the patient's symptoms have been ongoing for about 2 weeks.  She underwent CT scan of the abdomen and pelvis which did not show any acute findings.  She was hospitalized for further management.  Of note she has had a normal gastric emptying study in 2021.    Consultants: None  Procedures: None yet    Subjective/Interval History: Noted to be quite dizzy with vertiginous symptoms yesterday when she worked with physical therapy.  She mentions that this has happened several times previously.  She has apparently been seen by neurology but never never had any imaging studies.  Has not been seen by ENT.  Nausea vomiting is subsided.  No other complaints offered.    Assessment/Plan:  Intractable nausea and vomiting possibly due to diabetic gastroparesis CT was reassuring.  LFTs were normal.  Lipase was normal.  She does have diabetes so diabetic gastroparesis is likely although she did have a negative gastric emptying study in 2021.  Viral illness is also a possibility.   It looks like her last upper endoscopy was done in June 2024 at [facility].  Apparently this showed no significant findings.  Hiatal hernia was noted. There was no improvement with Zofran.  Patient was subsequently given Reglan with Benadryl at her request.  No side effects have been noted.  Continue with PPI and Carafate.   Patient's nausea and vomiting has subsided.  Reglan was changed over to oral.  Continue with Benadryl with the metoclopramide.  Vertigo Noted to have vertiginous  symptoms yesterday when she worked with physical therapy.  No focal neurological deficits noted.  No nystagmus noted on examination today.  She mentions that symptoms have been ongoing for a long time.  She does report occasional tinnitus.  She would benefit from outpatient evaluation by ENT.  This was conveyed to her.  Meclizine trial will be initiated.  Continue PT and OT. Reasonable to consider getting MRI Brain since she has not had one recently.  Care everywhere was also reviewed.  Urinary tract infection with Klebsiella Patient complained of dysuria.  UA was positive for UTI.   Urine culture growing Klebsiella.  Sensitivities reviewed.  Will change her from ceftriaxone to cephalexin.   Essential hypertension Blood pressures have been borderline low over the last 2 to 3 days.  Her antihypertensives were discontinued.  Diuretics also on hold.  Acute kidney injury Likely due to hypovolemia.  Resolved with IV fluids.   Hypokalemia Supplemented.    Insulin-dependent diabetes mellitus HbA1c was 7.4 in April 2024.  Long-acting insulin currently on hold due to low glucose levels.  Continue just SSI for now.    Chronic diastolic CHF/essential hypertension Holding diuretics for now due to hypovolemic status.  Normocytic anemia Likely due to chronic disease.  No evidence for overt bleeding.  History of DVT Continue Eliquis.  History of lupus and Sjogren's syndrome Continue Plaquenil.  History of depression and anxiety Continue with her home medications.  Hypothyroidism Continue levothyroxine  Physical deconditioning Seen by physical therapy today.  They noticed patient was having some vestibular symptoms.  Continue PT and OT.  Vestibular PT.  Obesity Estimated body mass index is 48.61 kg/m as calculated from the following:   Height as of 10/25/22: 5\' 8"  (1.727 m).   Weight as of this encounter: 145 kg.   DVT Prophylaxis: On apixaban Code Status: Full code Family  Communication: Discussed with patient Disposition Plan: Inpatient rehabilitation is being pursued    Medications: Scheduled:  apixaban  2.5 mg Oral BID   ARIPiprazole  5 mg Oral Daily   buPROPion  300 mg Oral Daily   metoCLOPramide  10 mg Oral TID AC   And   diphenhydrAMINE  12.5 mg Intravenous TID AC   feeding supplement  1 Container Oral QID   hydroxychloroquine  200 mg Oral BID   insulin aspart  0-15 Units Subcutaneous TID WC   insulin aspart  0-5 Units Subcutaneous QHS   levothyroxine  50 mcg Oral Q0600   meclizine  12.5 mg Oral TID   pantoprazole  40 mg Oral Q1200   pneumococcal 20-valent conjugate vaccine  0.5 mL Intramuscular Tomorrow-1000   pregabalin  75 mg Oral TID   QUEtiapine  300 mg Oral QHS   rosuvastatin  20 mg Oral Daily   sodium chloride flush  3 mL Intravenous Q12H   sucralfate  1 g Oral TID WC & HS   topiramate  75 mg Oral QHS   Continuous:  cefTRIAXone (ROCEPHIN)  IV 2 g (05/01/23 1405)    QMV:HQIONGEXBMWUX **OR** acetaminophen, albuterol, ALPRAZolam, ondansetron **OR** ondansetron (ZOFRAN) IV, mouth rinse, prochlorperazine, traMADol  Antibiotics: Anti-infectives (From admission, onward)    Start     Dose/Rate Route Frequency Ordered Stop   04/30/23 1500  cefTRIAXone (ROCEPHIN) 2 g in sodium chloride 0.9 % 100 mL IVPB        2 g 200 mL/hr over 30 Minutes Intravenous Every 24 hours 04/30/23 1351     04/25/23 1000  hydroxychloroquine (PLAQUENIL) tablet 200 mg        200 mg Oral 2 times daily 04/25/23 0441         Objective:  Vital Signs  Vitals:   05/02/23 0500 05/02/23 0501 05/02/23 0800 05/02/23 0901  BP:  (!) 86/43 (!) 100/58 96/62  Pulse:  99 94   Resp:  19 14   Temp:  98.4 F (36.9 C) 98.1 F (36.7 C)   TempSrc:   Oral   SpO2:  100% 100%   Weight: (!) 145 kg       Intake/Output Summary (Last 24 hours) at 05/02/2023 1026 Last data filed at 05/01/2023 1300 Gross per 24 hour  Intake 240 ml  Output --  Net 240 ml   Filed Weights    04/27/23 0552 04/28/23 0500 05/02/23 0500  Weight: (!) 140.3 kg (!) 142.8 kg (!) 145 kg    General appearance: Awake alert.  In no distress Resp: Clear to auscultation bilaterally.  Normal effort Cardio: S1-S2 is normal regular.  No S3-S4.  No rubs murmurs or bruit GI: Abdomen is soft.  Nontender nondistended.  Bowel sounds are present normal.  No masses organomegaly Extremities: No edema.  No nystagmus.  Cranial nerves II to XII intact.  Motor strength equal bilateral upper and lower extremities.  No dysdiadochokinesis.  Physical deconditioning is noted.   Lab Results:  Data Reviewed: I have personally reviewed following labs and reports of the imaging studies  CBC: Recent Labs  Lab 04/26/23 0431 04/27/23 0539 04/28/23 1037  WBC 8.0 8.4 6.6  HGB 9.5* 9.6* 9.2*  HCT 30.5* 31.1* 29.4*  MCV  87.1 86.9 87.8  PLT 198 172 162    Basic Metabolic Panel: Recent Labs  Lab 04/27/23 0539 04/28/23 1037 04/29/23 0734 04/30/23 0451 05/01/23 0554  NA 136 138 135 134* 136  K 3.4* 3.5 3.6 4.1 4.2  CL 109 107 107 108 110  CO2 22 25 20* 17* 22  GLUCOSE 104* 125* 155* 154* 174*  BUN 5* <5* <5* 10 10  CREATININE 0.91 1.08* 1.06* 1.20* 0.96  CALCIUM 8.2* 8.5* 8.2* 7.9* 8.0*  MG 1.8 1.6* 1.8  --   --     GFR: CrCl cannot be calculated (Unknown ideal weight.).   CBG: Recent Labs  Lab 05/01/23 0738 05/01/23 1131 05/01/23 1626 05/01/23 2028 05/02/23 0725  GLUCAP 176* 157* 198* 185* 133*    Radiology Studies: No results found.     LOS: 6 days   Anani Gu [employer] on [email or web address]  05/02/2023, 10:26 AM

## 2023-05-03 ENCOUNTER — Inpatient Hospital Stay (HOSPITAL_COMMUNITY): Payer: Medicaid Other

## 2023-05-03 DIAGNOSIS — R112 Nausea with vomiting, unspecified: Secondary | ICD-10-CM | POA: Diagnosis not present

## 2023-05-03 LAB — GLUCOSE, CAPILLARY
Glucose-Capillary: 124 mg/dL — ABNORMAL HIGH (ref 70–99)
Glucose-Capillary: 120 mg/dL — ABNORMAL HIGH (ref 70–99)
Glucose-Capillary: 238 mg/dL — ABNORMAL HIGH (ref 70–99)

## 2023-05-03 MED ORDER — PANTOPRAZOLE SODIUM 40 MG PO TBEC: 40.0000 mg | DELAYED_RELEASE_TABLET | Freq: Every day | ORAL | 0 refills | Status: DC

## 2023-05-03 MED ORDER — MECLIZINE HCL 12.5 MG PO TABS: 12.5000 mg | ORAL_TABLET | Freq: Three times a day (TID) | ORAL | 0 refills | Status: AC | PRN

## 2023-05-03 MED ORDER — CEPHALEXIN 500 MG PO CAPS: 500.0000 mg | ORAL_CAPSULE | Freq: Two times a day (BID) | ORAL | 0 refills | Status: AC

## 2023-05-03 NOTE — TOC Transition Note (Signed)
Transition of Care [facility]) - CM/SW Discharge Note   Patient Details  Name: [name] MRN: [id number] Date of Birth: [birth year]  [facility] CM/SW Contact:  Tom-Johnson, Hershal Coria, RN Phone Number: 05/03/2023, 12:40 PM   Clinical Narrative:     Patient is scheduled for discharge today.  Readmission Risk Assessment done. Outpatient referral, hospital f/u and discharge instructions on AVS. Sister, Isaac Laud to transport at discharge.  No further TOC needs noted.           Final next level of care: OP Rehab Barriers to Discharge: Barriers Resolved   Patient Goals and CMS Choice CMS Medicare.gov Compare Post Acute Care list provided to:: Patient Choice offered to / list presented to : Patient  Discharge Placement                  Patient to be transferred to facility by: Sister Name of family member notified: [facility]    Discharge Plan and Services Additional resources added to the After Visit Summary for                  DME Arranged: N/A DME Agency: NA       HH Arranged: NA HH Agency: NA        Social Determinants of Health (SDOH) Interventions SDOH Screenings   Food Insecurity: No Food Insecurity (04/25/2023)  Housing: Low Risk  (04/25/2023)  Transportation Needs: No Transportation Needs (04/25/2023)  Utilities: Not At Risk (04/25/2023)  Financial Resource Strain: Low Risk  (03/30/2022)   [facility] visits prior to 10/01/2022.  Social Connections: Unknown (12/11/2022)   [facility] from [facility], [facility]  Stress: No Stress Concern Present (01/04/2023)   Received from [facility], [facility]  Tobacco Use: Medium Risk (04/25/2023)     Readmission Risk Interventions    04/27/2023    3:50 PM 05/26/2021    4:02 PM  Readmission Risk Prevention Plan  Transportation Screening Complete Complete  PCP  or Specialist Appt within 3-5 Days Complete   HRI or Home Care Consult Complete   Social Work Consult for Recovery Care Planning/Counseling Complete   Palliative Care Screening Not Applicable   Medication Review Oceanographer) Referral to Pharmacy   PCP or Specialist appointment within 3-5 days of discharge  Complete  HRI or Home Care Consult  Complete  SW Recovery Care/Counseling Consult  Complete  Skilled Nursing Facility  Not Applicable

## 2023-05-03 NOTE — Progress Notes (Signed)
Physical Therapy Treatment Patient Details Name: [name] MRN: [id number] DOB: Jun 13, 1971 Today's Date: 05/03/2023   History of Present Illness Pt is 52 year old presented to [facility] on  04/24/23 for abdominal pain and intractable nausea and vomiting. CT without acute findings. PMH - DM, lupus, morbid obesity  systolic/diastolic CHF, chronic right ventricular failure, history of DVT, hypothyroidism, depression and anxiety and chronic abdominal pain.    PT Comments  Pt tolerates treatment well, with increased out of bed activity this session. Pt transfers multiple times without physical assistance and ambulates for multiple short bouts. Pt also demonstrates the ability to scoot on rollator to navigate into the bathroom, reporting she often mobilizes this way at home if experiencing significant knee pain. Pt continues to express the desire to discharge home, outpatient PT will be beneficial as HHPT is not covered by her insurance per case worker. Pt reports her sister will assist her in negotiating stairs into her apartment.    If plan is discharge home, recommend the following: A little help with walking and/or transfers;A little help with bathing/dressing/bathroom;Assistance with cooking/housework;Help with stairs or ramp for entrance   Can travel by private vehicle        Equipment Recommendations  None recommended by PT    Recommendations for Other Services       Precautions / Restrictions Precautions Precautions: Fall Restrictions Weight Bearing Restrictions: No     Mobility  Bed Mobility Overal bed mobility: Modified Independent Bed Mobility: Supine to Sit     Supine to sit: Modified independent (Device/Increase time), HOB elevated          Transfers Overall transfer level: Needs assistance Equipment used: Rollator (4 wheels) Transfers: Sit to/from Stand Sit to Stand: Supervision                Ambulation/Gait Ambulation/Gait assistance: Supervision Gait  Distance (Feet): 20 Feet (additional trial of 15') Assistive device: Rollator (4 wheels) Gait Pattern/deviations: Step-to pattern, Wide base of support Gait velocity: reduced Gait velocity interpretation: <1.31 ft/sec, indicative of household ambulator   General Gait Details: pt with slowed step-to gait, reduced stance time on LLE due to knee pain.   Stairs             Merchant navy officer mobility:  (pt scoots on rollator seat for 20' during session, reports typically mobilizing similarly to this at home if experiencing higher levels of L knee pain)   Tilt Bed    Modified Rankin (Stroke Patients Only)       Balance Overall balance assessment: Needs assistance Sitting-balance support: No upper extremity supported, Feet supported Sitting balance-Leahy Scale: Good     Standing balance support: Single extremity supported, Reliant on assistive device for balance Standing balance-Leahy Scale: Poor                              Cognition Arousal: Alert Behavior During Therapy: WFL for tasks assessed/performed Overall Cognitive Status: Within Functional Limits for tasks assessed                                          Exercises Other Exercises Other Exercises: PT encourages heel slides in supine or long arc quads in sitting before initiating ambulation in an effort to reduce stiffness in L knee prior to standing or ambulating  General Comments General comments (skin integrity, edema, etc.): VSS on RA      Pertinent Vitals/Pain Pain Assessment Pain Assessment: Faces Faces Pain Scale: Hurts even more Pain Location: lower abdomen and L knee Pain Descriptors / Indicators: Aching Pain Intervention(s): Monitored during session    Home Living                          Prior Function            PT Goals (current goals can now be found in the care plan section) Acute Rehab PT Goals Patient  Stated Goal: return home Progress towards PT goals: Progressing toward goals    Frequency    Min 1X/week      PT Plan      Co-evaluation              AM-PAC PT "6 Clicks" Mobility   Outcome Measure  Help needed turning from your back to your side while in a flat bed without using bedrails?: None Help needed moving from lying on your back to sitting on the side of a flat bed without using bedrails?: None Help needed moving to and from a bed to a chair (including a wheelchair)?: A Little Help needed standing up from a chair using your arms (e.g., wheelchair or bedside chair)?: A Little Help needed to walk in hospital room?: A Little Help needed climbing 3-5 steps with a railing? : A Lot 6 Click Score: 19    End of Session   Activity Tolerance: Patient tolerated treatment well Patient left: in bed;with call bell/phone within reach Nurse Communication: Mobility status PT Visit Diagnosis: Other abnormalities of gait and mobility (R26.89);Muscle weakness (generalized) (M62.81);Dizziness and giddiness (R42)     Time: 1114-1140 PT Time Calculation (min) (ACUTE ONLY): 26 min  Charges:    $Gait Training: 8-22 mins $Therapeutic Activity: 8-22 mins PT General Charges $$ ACUTE PT VISIT: 1 Visit                     Arlyss Gandy, PT, DPT Acute Rehabilitation Office [phone]    Arlyss Gandy 05/03/2023, 12:28 PM

## 2023-05-03 NOTE — Progress Notes (Signed)
Discharge instructions reviewed with pt.  Copy of instructions given to pt. Pt informed her scripts were sent to her pharmacy for pick up.  Pt states her sister is picking her up after she gets off work, sister works in [place], time may be after 6pm before sister arrives.  Pt verbalized understanding of instructions and when to take her medications next. Pt states she is able to check her blood pressure at home and understands when to call the MD as per instructions on her AVS.   Pt is ready for discharge when her sister arrives, information relayed to pt's  primary nurse, Luther Parody.   Kuzey Ogata,RN SWOT

## 2023-05-03 NOTE — Progress Notes (Signed)
Inpatient Rehab Admissions Coordinator:   Notified by MD pt would like to d/c home.  PT updated recommendations yesterday as pt mobilizing well and continues to do well today.  We will sign off for CIR.   Estill Dooms, PT, DPT Admissions Coordinator [phone] 05/03/23  12:53 PM

## 2023-05-03 NOTE — Discharge Summary (Addendum)
Physician Discharge Summary   Patient: [name] MRN: [id number] DOB: [birth year]  Admit date:     04/24/2023  Discharge date: 05/03/23  Discharge Physician: Alba Cory   PCP: Verlon Au, MD   Recommendations at discharge:   Follow up resolution of UTI Follow up with PCP for chronic medical problems.   Discharge Diagnoses: Principal Problem:   Intractable nausea and vomiting Active Problems:   SJOGREN'S SYNDROME   Chronic diastolic CHF (congestive heart failure) (HCC)   Type 2 diabetes mellitus with hyperlipidemia (HCC)   Essential hypertension   History of pulmonary embolism   SLE (systemic lupus erythematosus related syndrome) (HCC)   Hypokalemia   Anxiety and depression   Malnutrition of moderate degree  Resolved Problems:   * No resolved hospital problems. *  Hospital Course: 52 year old with past medical history significant for insulin-dependent diabetes, chronic heart failure preserved ejection fraction, history of DVT on Eliquis, hypothyroidism, depression, anxiety, lupus, estrogen syndrome, BMI 55 obesity presents with upper abdominal pain, nausea vomiting. She has been having symptoms for the last 2 weeks. CT scan abdomen and pelvis did not show any acute finding. She has had a normal gastric emptying study in 2021.   Assessment and Plan: 1-intractable nausea vomiting due to diabetes gastroparesis  in setting gastroparesis. Improved with Reglan.  Tolerating diet. Discharge home.  She has chronic abdominal pain, she will need follow up with her PCP. CT abdomen pelvis negative.   Vertigo:  MRI negative for acute stroke, follow up chronic finding with PCP.  PRN meclizine.   UTI:  Klebsiella  UTI.  Discharge on Keflex for 3 more days  AKI Due to hypovolemia.  Resolved  Hypokalemia;   replaced.   Insulin-dependent diabetes mellitus  Resume home regimen.  Tolerating diet now.    Chronic diastolic heart failure Compensated.   Essential  hypertension Blood pressure was soft.  Diuretics, Imdur and beta-blocker discontinued.  Normocytic  anemia Follow-up as an outpatient Hb was stable  History of DVT:  Continue with Eliquis   History of lupus and Sjogren's  syndrome Continue with plaquenil.    History of depression and anxiety Continue with home dose Abilify, Wellbutrin and Xanax Continue with Seroquel  Hypothyroidism: Continue with Synthroid.   Physical deconditioning PT now recommend out patient PT.  Plan to discharge home today.   Morbid obesity Needs life style modifications.         Consultants: None Procedures performed: none Disposition: Home Diet recommendation:  Discharge Diet Orders (From admission, onward)     Start     Ordered   05/03/23 0000  Diet - low sodium heart healthy        05/03/23 1131   05/01/23 0000  Diet Carb Modified        05/01/23 1101           Carb modified diet DISCHARGE MEDICATION: Allergies as of 05/03/2023       Reactions   Metoclopramide Other (See Comments)   Developed restless leg, akathisia type limb movements.    Codeine Itching, Rash   Hydrocodone Rash   Percocet [oxycodone-acetaminophen] Hives, Rash   Tolerates dilaudid Tolerates acetaminophen         Medication List     STOP taking these medications    amLODipine 10 MG tablet Commonly known as: NORVASC   carvedilol 25 MG tablet Commonly known as: COREG   isosorbide-hydrALAZINE 20-37.5 MG tablet Commonly known as: BIDIL       TAKE  these medications    Advair HFA 230-21 MCG/ACT inhaler Generic drug: fluticasone-salmeterol Inhale 2 puffs into the lungs 2 (two) times daily.   albuterol (2.5 MG/3ML) 0.083% nebulizer solution Commonly known as: PROVENTIL Take 2.5 mg by nebulization as needed for wheezing.   ALPRAZolam 0.5 MG tablet Commonly known as: XANAX Take 0.5 mg by mouth as needed for anxiety or sleep.   apixaban 2.5 MG Tabs tablet Commonly known as: ELIQUIS Take 1  tablet (2.5 mg total) by mouth 2 (two) times daily.   ARIPiprazole 5 MG tablet Commonly known as: ABILIFY Take 5 mg by mouth daily.   Azathioprine 75 MG Tabs Take 1 tablet by mouth 2 (two) times daily.   B-D UF III MINI PEN NEEDLES 31G X 5 MM Misc Generic drug: Insulin Pen Needle USE AS DIRECTED TWICE A DAY   Easy Touch Pen Needles 32G X 4 MM Misc Generic drug: Insulin Pen Needle USE TO INJECT INSULIN TWICE DAILY   buPROPion 300 MG 24 hr tablet Commonly known as: WELLBUTRIN XL Take 300 mg by mouth daily.   cephALEXin 500 MG capsule Commonly known as: KEFLEX Take 1 capsule (500 mg total) by mouth 2 (two) times daily for 3 days.   cromolyn 4 % ophthalmic solution Commonly known as: OPTICROM Place 1 drop into both eyes 2 (two) times daily.   cycloSPORINE 0.05 % ophthalmic emulsion Commonly known as: RESTASIS Place 1 drop into both eyes in the morning, at noon, and at bedtime.   diclofenac Sodium 1 % Gel Commonly known as: VOLTAREN APPLY 2 GRAMS TO AFFECTED AREA 4 TIMES A DAY What changed: See the new instructions.   diphenhydrAMINE 50 MG tablet Commonly known as: BENADRYL Take 0.5 tablets (25 mg total) by mouth every 6 (six) hours as needed for itching.   empagliflozin 10 MG Tabs tablet Commonly known as: JARDIANCE Take 1 tablet by mouth daily.   famotidine 20 MG tablet Commonly known as: PEPCID Take 20 mg by mouth 2 (two) times daily.   fluticasone 50 MCG/ACT nasal spray Commonly known as: FLONASE Place 2 sprays into both nostrils continuous as needed for allergies or rhinitis.   FreeStyle Libre 3 Sensor Misc 1 Device by Does not apply route every 14 (fourteen) days. Apply 1 sensor on upper arm every 14 days for continuous glucose monitoring   HumuLIN R U-500 KwikPen 500 UNIT/ML KwikPen Generic drug: insulin regular human CONCENTRATED 100 UNITS, 30 MINUTES BEFORE EACH MEAL   hydroxychloroquine 200 MG tablet Commonly known as: PLAQUENIL Take 200 mg by mouth  2 (two) times daily.   ibuprofen 600 MG tablet Commonly known as: ADVIL Take 1 tablet (600 mg total) by mouth every 6 (six) hours as needed. What changed: reasons to take this   Insulin Syringe-Needle U-100 31G X 5/16" 1 ML Misc Commonly known as: Easy Touch Insulin Syringe USE AS DIRECTED THREE TIMES A DAY   levothyroxine 50 MCG tablet Commonly known as: SYNTHROID Take 1 tablet (50 mcg total) by mouth daily at 6 (six) AM.   meclizine 12.5 MG tablet Commonly known as: ANTIVERT Take 1 tablet (12.5 mg total) by mouth 3 (three) times daily as needed for dizziness.   megestrol 40 MG tablet Commonly known as: MEGACE Take 40 mg by mouth daily.   metFORMIN 500 MG 24 hr tablet Commonly known as: GLUCOPHAGE-XR Take 2 tablets (1,000 mg total) by mouth 2 (two) times daily.   metoCLOPramide 10 MG tablet Commonly known as: REGLAN Take 1 tablet (10  mg total) by mouth 3 (three) times daily before meals.   multivitamin with minerals Tabs tablet Take 1 tablet by mouth daily.   ondansetron 4 MG disintegrating tablet Commonly known as: ZOFRAN-ODT Take 1 tablet (4 mg total) by mouth every 8 (eight) hours as needed for nausea or vomiting.   OneTouch Ultra test strip Generic drug: glucose blood Test blood sugar 4 times a day   Accu-Chek Guide test strip Generic drug: glucose blood Use as instructed   onetouch ultrasoft lancets Test blood sugar 4 times a day   pantoprazole 40 MG tablet Commonly known as: PROTONIX Take 1 tablet (40 mg total) by mouth daily at 12 noon. Start taking on: May 04, 2023   pregabalin 75 MG capsule Commonly known as: LYRICA Take 75 mg 3 (three) times daily by mouth.   QUEtiapine 300 MG tablet Commonly known as: SEROQUEL Take 300 mg by mouth at bedtime.   rosuvastatin 20 MG tablet Commonly known as: CRESTOR Take 1 tablet (20 mg total) by mouth daily.   sucralfate 1 GM/10ML suspension Commonly known as: CARAFATE Take 10 mLs by mouth 2 (two) times  daily as needed (abdomen pain).   topiramate 25 MG tablet Commonly known as: TOPAMAX Take 75 mg by mouth at bedtime.   torsemide 20 MG tablet Commonly known as: DEMADEX Take 1 tablet (20 mg total) by mouth at bedtime.   traMADol 50 MG tablet Commonly known as: ULTRAM Take 1 tablet (50 mg total) by mouth every 6 (six) hours as needed.   Transderm-Scop 1 MG/3DAYS Generic drug: scopolamine Place 1 patch (1.5 mg total) onto the skin every 3 (three) days. As needed for nausea   Vitamin D (Ergocalciferol) 1.25 MG (50000 UNIT) Caps capsule Commonly known as: DRISDOL Take 50,000 Units by mouth once a week.        Follow-up Information     [facility].   Specialty: Home Health Services Why: Someone will call you to schedule first home visit. Contact information: [address] [place] [address] [phone]         Verlon Au, MD. Schedule an appointment as soon as possible for a visit in 1 week(s).   Specialty: Family Medicine Why: post hospitalization follow up Contact information: [address] [facility] [place] [address] [phone]                Discharge Exam: Filed Weights   04/28/23 0500 05/02/23 0500 05/03/23 0500  Weight: (!) 142.8 kg (!) 145 kg (!) 145.3 kg  General NAD  Condition at discharge: stable  The results of significant diagnostics from this hospitalization (including imaging, microbiology, ancillary and laboratory) are listed below for reference.   Imaging Studies: MR BRAIN WO CONTRAST  Result Date: 05/03/2023 CLINICAL DATA:  Syncope/presyncope, cerebrovascular cause suspected. Vertigo. EXAM: MRI HEAD WITHOUT CONTRAST TECHNIQUE: Multiplanar, multiecho pulse sequences of the brain and surrounding structures were obtained without intravenous contrast. COMPARISON:  Non-contrast head CT and CT angiogram head/neck 10/23/2022. Brain MRI 02/19/2021. FINDINGS: Brain:  Cerebral volume is normal. There are a few small nonspecific foci of T2 FLAIR hyperintense signal abnormality within the cerebral white matter. No cortical encephalomalacia is identified. There is no acute infarct. No evidence of an intracranial mass. No chronic intracranial blood products. No extra-axial fluid collection. No midline shift. Vascular: Maintained flow voids within the proximal large arterial vessels. Skull and upper cervical spine: Abnormal T1 hypointense marrow signal within the  calvarium and included cervical spine. Sinuses/Orbits: No mass or acute finding within the imaged orbits. 18 mm mucous retention cyst with the right maxillary sinus. Mucous retention cysts within the left maxillary sinus measuring up to 16 mm. Mild mucosal thickening, and possible small fluid level, within the right sphenoid sinus. T2 hyperintense opacification of a right ethmoid air cell posteriorly. IMPRESSION: 1. No evidence of an acute intracranial abnormality. 2. There are a few small nonspecific T2 FLAIR hyperintense chronic insults within the cerebral white matter, similar to the prior brain MRI of 02/19/2021. 3. Otherwise unremarkable non-contrast MRI appearance of the brain. 4. Abnormal T1 hypointense marrow signal within the calvarium and included portions of the cervical spine. While this finding can reflect a marrow infiltrative process, the most common causes include chronic anemia, smoking and obesity. 5. Paranasal sinus disease as described. Electronically Signed   By: Jackey Loge D.O.   On: 05/03/2023 08:10   CT ABDOMEN PELVIS WO CONTRAST  Result Date: 04/25/2023 CLINICAL DATA:  Abdominal pain and emesis. EXAM: CT ABDOMEN AND PELVIS WITHOUT CONTRAST TECHNIQUE: Multidetector CT imaging of the abdomen and pelvis was performed following the standard protocol without IV contrast. RADIATION DOSE REDUCTION: This exam was performed according to the departmental dose-optimization program which includes automated  exposure control, adjustment of the mA and/or kV according to patient size and/or use of iterative reconstruction technique. COMPARISON:  11/18/2022. FINDINGS: Lower chest: The heart is borderline enlarged and there is a trace pericardial effusion. Mild atelectasis is present at the left lung base. Hepatobiliary: No focal liver abnormality is seen. No gallstones, gallbladder wall thickening, or biliary dilatation. Pancreas: Unremarkable. No pancreatic ductal dilatation or surrounding inflammatory changes. Spleen: Normal in size without focal abnormality. Adrenals/Urinary Tract: The adrenal glands are within normal limits. Nonobstructive renal calculus is noted on the right. No hydroureteronephrosis bilaterally. The bladder is unremarkable. Stomach/Bowel: There is a small hiatal hernia. Stomach is within normal limits. Appendix appears normal. No evidence of bowel wall thickening, distention, or inflammatory changes. No free air or pneumatosis. Vascular/Lymphatic: Aortic atherosclerosis. No enlarged abdominal or pelvic lymph nodes. Reproductive: Uterus and bilateral adnexa are unremarkable. Other: No abdominopelvic ascites. Increased density is noted in the subcutaneous tissues at the umbilicus. No focal fluid collection is seen. Musculoskeletal: Degenerative changes are present in the thoracolumbar spine. No acute osseous abnormality. IMPRESSION: 1. No acute intra-abdominal process. 2. Nonobstructive right renal calculus. 3. Small hiatal hernia. 4. Aortic atherosclerosis. 5. Increased density in the subcutaneous tissues at the umbilicus, which may be infectious or inflammatory. Electronically Signed   By: Thornell Sartorius M.D.   On: 04/25/2023 02:42    Microbiology: Results for orders placed or performed during the hospital encounter of 04/24/23  Urine Culture (for pregnant, neutropenic or urologic patients or patients with an indwelling urinary catheter)     Status: Abnormal   Collection Time: 04/30/23 11:04 AM    Specimen: Urine, Clean Catch  Result Value Ref Range Status   Specimen Description URINE, CLEAN CATCH  Final   Special Requests   Final    NONE Performed at [facility], [address] [place], [place] [address]    Culture >=100,000 COLONIES/mL KLEBSIELLA PNEUMONIAE (A)  Final   Report Status 05/02/2023 FINAL  Final   Organism ID, Bacteria KLEBSIELLA PNEUMONIAE (A)  Final      Susceptibility   Klebsiella pneumoniae - MIC*    AMPICILLIN RESISTANT Resistant     CEFAZOLIN <=4 SENSITIVE Sensitive     CEFEPIME <=  0.12 SENSITIVE Sensitive     CEFTRIAXONE <=0.25 SENSITIVE Sensitive     CIPROFLOXACIN <=0.25 SENSITIVE Sensitive     GENTAMICIN <=1 SENSITIVE Sensitive     IMIPENEM <=0.25 SENSITIVE Sensitive     NITROFURANTOIN 64 INTERMEDIATE Intermediate     TRIMETH/SULFA <=20 SENSITIVE Sensitive     AMPICILLIN/SULBACTAM 4 SENSITIVE Sensitive     PIP/TAZO <=4 SENSITIVE Sensitive     * >=100,000 COLONIES/mL KLEBSIELLA PNEUMONIAE    Labs: CBC: Recent Labs  Lab 04/27/23 0539 04/28/23 1037  WBC 8.4 6.6  HGB 9.6* 9.2*  HCT 31.1* 29.4*  MCV 86.9 87.8  PLT 172 162   Basic Metabolic Panel: Recent Labs  Lab 04/27/23 0539 04/28/23 1037 04/29/23 0734 04/30/23 0451 05/01/23 0554  NA 136 138 135 134* 136  K 3.4* 3.5 3.6 4.1 4.2  CL 109 107 107 108 110  CO2 22 25 20* 17* 22  GLUCOSE 104* 125* 155* 154* 174*  BUN 5* <5* <5* 10 10  CREATININE 0.91 1.08* 1.06* 1.20* 0.96  CALCIUM 8.2* 8.5* 8.2* 7.9* 8.0*  MG 1.8 1.6* 1.8  --   --    Liver Function Tests: No results for input(s): "AST", "ALT", "ALKPHOS", "BILITOT", "PROT", "ALBUMIN" in the last 168 hours. CBG: Recent Labs  Lab 05/02/23 1129 05/02/23 1619 05/02/23 1918 05/03/23 0718 05/03/23 1112  GLUCAP 158* 162* 201* 124* 120*    Discharge time spent: greater than 30 minutes.  Signed: Alba Cory, MD [facility] 05/03/2023

## 2023-05-18 NOTE — Therapy (Deleted)
OUTPATIENT PHYSICAL THERAPY THORACOLUMBAR EVALUATION   Patient Name: [name] MRN: [id number] DOB:[birth year], 52 y.o., female Today's Date: 05/18/2023  END OF SESSION:   Past Medical History:  Diagnosis Date   Acute renal failure (HCC)     Intractable nausea vomiting secondary to diabetic gastroparesis causing dehydration and acute renal failure /notes 04/01/2013   Anginal pain (HCC)    Anxiety    Bell's palsy 08/09/2010   Bicornuate uterus    CAP (community acquired pneumonia) 10/2011   Hattie Perch 11/08/2011   Chest pain 01/13/2014   CHF (congestive heart failure) (HCC)    Diabetic gastroparesis associated with type 2 diabetes mellitus (HCC)    this is presumed diagnoses, not confirmed by any studies.    Eczema    Family history of breast cancer    Family history of malignant neoplasm of breast    Family history of malignant neoplasm of ovary    Family history of ovarian cancer    Family history of prostate cancer    GERD (gastroesophageal reflux disease)    High cholesterol    Hypertension    Liver lesion 08/13/2019   Liver mass 2014   biopsied 03/2013 at [facility], not malignant.  is to undergo radiologic ablation of the mass in sept/October 2014.    Lupus (HCC)    Migraines    "maybe a couple times/yr" (04/01/2013)   Obesity    Obstructive sleep apnea on CPAP 2011   Oxygen at nights   Pulmonary embolism (HCC) 05/21/2021   SJOGREN'S SYNDROME 08/09/2010   SLE (systemic lupus erythematosus) (HCC)    Hattie Perch 12/01/2010   Past Surgical History:  Procedure Laterality Date   BREAST CYST EXCISION Left 08/2005   epidermoid   CARDIAC CATHETERIZATION  02/07/2012   ECTOPIC PREGNANCY SURGERY  1999   ESOPHAGOGASTRODUODENOSCOPY N/A 02/26/2013   Procedure: ESOPHAGOGASTRODUODENOSCOPY (EGD);  Surgeon: Hilarie Fredrickson, MD;  Location: [facility] ENDOSCOPY;  Service: Endoscopy;  Laterality: N/A;   ESOPHAGOGASTRODUODENOSCOPY N/A 03/02/2015   Procedure: ESOPHAGOGASTRODUODENOSCOPY (EGD);   Surgeon: Rachael Fee, MD;  Location: [facility] ENDOSCOPY;  Service: Endoscopy;  Laterality: N/A;   ESOPHAGOGASTRODUODENOSCOPY N/A 02/23/2021   Procedure: ESOPHAGOGASTRODUODENOSCOPY (EGD);  Surgeon: Charlott Rakes, MD;  Location: [facility] ENDOSCOPY;  Service: Endoscopy;  Laterality: N/A;  possible dilation   HERNIA REPAIR     LEFT AND RIGHT HEART CATHETERIZATION WITH CORONARY ANGIOGRAM N/A 02/07/2012   Procedure: LEFT AND RIGHT HEART CATHETERIZATION WITH CORONARY ANGIOGRAM;  Surgeon: Pamella Pert, MD;  Location: [facility] CATH LAB;  Service: Cardiovascular;  Laterality: N/A;   LIVER BIOPSY  03/2013   liver mass/medical hx noted above   MUSCLE BIOPSY     for lupus/notes 12/01/2010   UMBILICAL HERNIA REPAIR  1980's   Patient Active Problem List   Diagnosis Date Noted   Malnutrition of moderate degree 04/27/2023   Anxiety and depression 04/25/2023   Ileus (HCC) 11/16/2022   Hypokalemia 11/16/2022   Hypomagnesemia 11/16/2022   Hypothyroidism 11/16/2022   Oral candidiasis 11/16/2022   History of venous thromboembolism 11/16/2022   Severe sepsis (HCC) 11/09/2022   Acute metabolic encephalopathy 10/24/2022   Intra-abdominal abscess (HCC) 10/24/2022   Nausea & vomiting 05/09/2022   Vitamin B12 deficiency 01/04/2022   History of pulmonary embolism 12/22/2021   Normocytic anemia 12/22/2021   Foot pain, bilateral 11/22/2021   Genetic testing 08/17/2021   Family history of breast cancer 07/19/2021   Family history of prostate cancer 07/19/2021   Esophageal dysmotility 06/03/2021   Physical debility 08/29/2019  Obesity hypoventilation syndrome (HCC) 08/23/2019   Skin breakdown 08/23/2019   Liver hemorrhage 08/23/2019   History of CVA (cerebrovascular accident) 08/19/2019   Mood disorder (HCC) 08/19/2019   Morbid obesity with BMI of 50.0-59.9, adult (HCC) 08/19/2019   SLE (systemic lupus erythematosus related syndrome) (HCC) 08/19/2019   Hepatic adenoma    Sciatica 04/20/2018   Type 2 diabetes  mellitus with hyperlipidemia (HCC) 09/19/2015   Radiculopathy of cervical spine    Family history of malignant neoplasm of breast    Family history of malignant neoplasm of ovary    Intractable nausea and vomiting 11/11/2013   Septate uterus 09/08/2013   Diabetic gastroparesis (HCC) 04/28/2013   Constipation due to pain medication 04/21/2013   Odynophagia 04/04/2013   AKI (acute kidney injury) (HCC) 04/01/2013   Liver masses 02/28/2013   Generalized abdominal pain 02/26/2013   Reflux esophagitis 02/26/2013   Morbid obesity (HCC) 10/10/2012   Chronic diastolic CHF (congestive heart failure) (HCC) 10/09/2012   Clinical polymyositis syndrome (HCC) 09/06/2012   Mononeuritis of lower limb 01/12/2012   Chronic combined systolic and diastolic CHF (congestive heart failure) (HCC) 11/08/2011   Essential hypertension 11/08/2011   HLD (hyperlipidemia) 07/19/2011   Bell's palsy 08/09/2010   SJOGREN'S SYNDROME 08/09/2010    PCP: ***  REFERRING PROVIDER: ***  REFERRING DIAG: ***  Rationale for Evaluation and Treatment: {HABREHAB:27488}  THERAPY DIAG:  No diagnosis found.  ONSET DATE: ***  SUBJECTIVE:                                                                                                                                                                                           SUBJECTIVE STATEMENT: ***  PERTINENT HISTORY:  Patient DC from [facility] on 05/03/23  Pt is 52 year old presented to [facility] on  04/24/23 for abdominal pain and intractable nausea and vomiting. CT without acute findings. PMH - DM, lupus, morbid obesity  systolic/diastolic CHF, chronic right ventricular failure, history of DVT, hypothyroidism, depression and anxiety and chronic abdominal pain.  PAIN:  Are you having pain? Yes: NPRS scale: ***/10 Pain location: *** Pain description: *** Aggravating factors: *** Relieving factors: ***  PRECAUTIONS: {Therapy precautions:24002}  RED FLAGS: {PT Red  Flags:29287}   WEIGHT BEARING RESTRICTIONS: {Yes ***/No:24003}  FALLS:  Has patient fallen in last 6 months? {fallsyesno:27318}  LIVING ENVIRONMENT: Lives with: {OPRC lives with:25569::"lives with their family"} Lives in: {Lives in:25570} Stairs: {opstairs:27293} Has following equipment at home: {Assistive devices:23999}  OCCUPATION: ***  PLOF: {PLOF:24004}  PATIENT GOALS: ***  NEXT MD VISIT: ***  OBJECTIVE:  Note: Objective measures were completed at Evaluation unless otherwise noted.  DIAGNOSTIC  FINDINGS:  ***  PATIENT SURVEYS:  {rehab surveys:24030}  SCREENING FOR RED FLAGS: Bowel or bladder incontinence: {Yes/No:304960894} Spinal tumors: {Yes/No:304960894} Cauda equina syndrome: {Yes/No:304960894} Compression fracture: {Yes/No:304960894} Abdominal aneurysm: {Yes/No:304960894}  COGNITION: Overall cognitive status: {cognition:24006}     SENSATION: {sensation:27233}  MUSCLE LENGTH: Hamstrings: Right *** deg; Left *** deg Thomas test: Right *** deg; Left *** deg  POSTURE: {posture:25561}  PALPATION: ***  LUMBAR ROM:   AROM eval  Flexion   Extension   Right lateral flexion   Left lateral flexion   Right rotation   Left rotation    (Blank rows = not tested)  LOWER EXTREMITY ROM:     {AROM/PROM:27142}  Right eval Left eval  Hip flexion    Hip extension    Hip abduction    Hip adduction    Hip internal rotation    Hip external rotation    Knee flexion    Knee extension    Ankle dorsiflexion    Ankle plantarflexion    Ankle inversion    Ankle eversion     (Blank rows = not tested)  LOWER EXTREMITY MMT:    MMT Right eval Left eval  Hip flexion    Hip extension    Hip abduction    Hip adduction    Hip internal rotation    Hip external rotation    Knee flexion    Knee extension    Ankle dorsiflexion    Ankle plantarflexion    Ankle inversion    Ankle eversion     (Blank rows = not tested)  LUMBAR SPECIAL TESTS:  {lumbar  special test:25242}  FUNCTIONAL TESTS:  {Functional tests:24029}  GAIT: Distance walked: *** Assistive device utilized: {Assistive devices:23999} Level of assistance: {Levels of assistance:24026} Comments: ***  TODAY'S TREATMENT:                                                                                                                              DATE: ***    PATIENT EDUCATION:  Education details: *** Person educated: {Person educated:25204} Education method: {Education Method:25205} Education comprehension: {Education Comprehension:25206}  HOME EXERCISE PROGRAM: ***  ASSESSMENT:  CLINICAL IMPRESSION: Patient is a *** y.o. *** who was seen today for physical therapy evaluation and treatment for ***.   OBJECTIVE IMPAIRMENTS: {opptimpairments:25111}.   ACTIVITY LIMITATIONS: {activitylimitations:27494}  PARTICIPATION LIMITATIONS: {participationrestrictions:25113}  PERSONAL FACTORS: {Personal factors:25162} are also affecting patient's functional outcome.   REHAB POTENTIAL: {rehabpotential:25112}  CLINICAL DECISION MAKING: {clinical decision making:25114}  EVALUATION COMPLEXITY: {Evaluation complexity:25115}   GOALS: Goals reviewed with patient? {yes/no:20286}  SHORT TERM GOALS: Target date: ***  *** Baseline: Goal status: INITIAL  2.  *** Baseline:  Goal status: INITIAL  3.  *** Baseline:  Goal status: INITIAL  4.  *** Baseline:  Goal status: INITIAL  5.  *** Baseline:  Goal status: INITIAL  6.  *** Baseline:  Goal status: INITIAL  LONG TERM GOALS: Target date: ***  *** Baseline:  Goal status: INITIAL  2.  *** Baseline:  Goal status: INITIAL  3.  *** Baseline:  Goal status: INITIAL  4.  *** Baseline:  Goal status: INITIAL  5.  *** Baseline:  Goal status: INITIAL  6.  *** Baseline:  Goal status: INITIAL  PLAN:  PT FREQUENCY: {rehab frequency:25116}  PT DURATION: {rehab duration:25117}  PLANNED INTERVENTIONS:  {rehab planned interventions:25118::"97110-Therapeutic exercises","97530- Therapeutic [phone]- Neuromuscular re-education","97535- Self DGLO","75643- Manual therapy"}.  PLAN FOR NEXT SESSION: ***   Yaffa Seckman, PT 05/18/2023, 9:09 AM

## 2023-05-19 ENCOUNTER — Ambulatory Visit: Payer: Medicaid Other | Attending: Internal Medicine | Admitting: Physical Therapy

## 2023-06-02 ENCOUNTER — Ambulatory Visit: Payer: Medicaid Other | Attending: Family Medicine | Admitting: Physical Therapy

## 2023-06-02 ENCOUNTER — Ambulatory Visit: Payer: Medicaid Other | Admitting: Occupational Therapy

## 2023-06-02 NOTE — Therapy (Deleted)
OUTPATIENT PHYSICAL THERAPY LOWER EXTREMITY EVALUATION   Patient Name: [name] MRN: [id number] DOB:Jan 26, 1971, 52 y.o., female Today's Date: 06/02/2023  END OF SESSION:   Past Medical History:  Diagnosis Date   Acute renal failure (HCC)     Intractable nausea vomiting secondary to diabetic gastroparesis causing dehydration and acute renal failure /notes 04/01/2013   Anginal pain (HCC)    Anxiety    Bell's palsy 08/09/2010   Bicornuate uterus    CAP (community acquired pneumonia) 10/2011   Hattie Perch 11/08/2011   Chest pain 01/13/2014   CHF (congestive heart failure) (HCC)    Diabetic gastroparesis associated with type 2 diabetes mellitus (HCC)    this is presumed diagnoses, not confirmed by any studies.    Eczema    Family history of breast cancer    Family history of malignant neoplasm of breast    Family history of malignant neoplasm of ovary    Family history of ovarian cancer    Family history of prostate cancer    GERD (gastroesophageal reflux disease)    High cholesterol    Hypertension    Liver lesion 08/13/2019   Liver mass 2014   biopsied 03/2013 at [facility], not malignant.  is to undergo radiologic ablation of the mass in sept/October 2014.    Lupus (HCC)    Migraines    "maybe a couple times/yr" (04/01/2013)   Obesity    Obstructive sleep apnea on CPAP 2011   Oxygen at nights   Pulmonary embolism (HCC) 05/21/2021   SJOGREN'S SYNDROME 08/09/2010   SLE (systemic lupus erythematosus) (HCC)    Hattie Perch 12/01/2010   Past Surgical History:  Procedure Laterality Date   BREAST CYST EXCISION Left 08/2005   epidermoid   CARDIAC CATHETERIZATION  02/07/2012   ECTOPIC PREGNANCY SURGERY  1999   ESOPHAGOGASTRODUODENOSCOPY N/A 02/26/2013   Procedure: ESOPHAGOGASTRODUODENOSCOPY (EGD);  Surgeon: Hilarie Fredrickson, MD;  Location: [facility] ENDOSCOPY;  Service: Endoscopy;  Laterality: N/A;   ESOPHAGOGASTRODUODENOSCOPY N/A 03/02/2015   Procedure: ESOPHAGOGASTRODUODENOSCOPY (EGD);   Surgeon: Rachael Fee, MD;  Location: [facility] ENDOSCOPY;  Service: Endoscopy;  Laterality: N/A;   ESOPHAGOGASTRODUODENOSCOPY N/A 02/23/2021   Procedure: ESOPHAGOGASTRODUODENOSCOPY (EGD);  Surgeon: Charlott Rakes, MD;  Location: [facility] ENDOSCOPY;  Service: Endoscopy;  Laterality: N/A;  possible dilation   HERNIA REPAIR     LEFT AND RIGHT HEART CATHETERIZATION WITH CORONARY ANGIOGRAM N/A 02/07/2012   Procedure: LEFT AND RIGHT HEART CATHETERIZATION WITH CORONARY ANGIOGRAM;  Surgeon: Pamella Pert, MD;  Location: [facility] CATH LAB;  Service: Cardiovascular;  Laterality: N/A;   LIVER BIOPSY  03/2013   liver mass/medical hx noted above   MUSCLE BIOPSY     for lupus/notes 12/01/2010   UMBILICAL HERNIA REPAIR  1980's   Patient Active Problem List   Diagnosis Date Noted   Malnutrition of moderate degree 04/27/2023   Anxiety and depression 04/25/2023   Ileus (HCC) 11/16/2022   Hypokalemia 11/16/2022   Hypomagnesemia 11/16/2022   Hypothyroidism 11/16/2022   Oral candidiasis 11/16/2022   History of venous thromboembolism 11/16/2022   Severe sepsis (HCC) 11/09/2022   Acute metabolic encephalopathy 10/24/2022   Intra-abdominal abscess (HCC) 10/24/2022   Nausea & vomiting 05/09/2022   Vitamin B12 deficiency 01/04/2022   History of pulmonary embolism 12/22/2021   Normocytic anemia 12/22/2021   Foot pain, bilateral 11/22/2021   Genetic testing 08/17/2021   Family history of breast cancer 07/19/2021   Family history of prostate cancer 07/19/2021   Esophageal dysmotility 06/03/2021   Physical debility  08/29/2019   Obesity hypoventilation syndrome (HCC) 08/23/2019   Skin breakdown 08/23/2019   Liver hemorrhage 08/23/2019   History of CVA (cerebrovascular accident) 08/19/2019   Mood disorder (HCC) 08/19/2019   Morbid obesity with BMI of 50.0-59.9, adult (HCC) 08/19/2019   SLE (systemic lupus erythematosus related syndrome) (HCC) 08/19/2019   Hepatic adenoma    Sciatica 04/20/2018   Type 2 diabetes  mellitus with hyperlipidemia (HCC) 09/19/2015   Radiculopathy of cervical spine    Family history of malignant neoplasm of breast    Family history of malignant neoplasm of ovary    Intractable nausea and vomiting 11/11/2013   Septate uterus 09/08/2013   Diabetic gastroparesis (HCC) 04/28/2013   Constipation due to pain medication 04/21/2013   Odynophagia 04/04/2013   AKI (acute kidney injury) (HCC) 04/01/2013   Liver masses 02/28/2013   Generalized abdominal pain 02/26/2013   Reflux esophagitis 02/26/2013   Morbid obesity (HCC) 10/10/2012   Chronic diastolic CHF (congestive heart failure) (HCC) 10/09/2012   Clinical polymyositis syndrome (HCC) 09/06/2012   Mononeuritis of lower limb 01/12/2012   Chronic combined systolic and diastolic CHF (congestive heart failure) (HCC) 11/08/2011   Essential hypertension 11/08/2011   HLD (hyperlipidemia) 07/19/2011   Bell's palsy 08/09/2010   SJOGREN'S SYNDROME 08/09/2010    PCP: Verlon Au, MD  REFERRING PROVIDER: Alba Cory, MD  REFERRING DIAG: R11.2 (ICD-10-CM) - Intractable nausea and vomiting R53.1 (ICD-10-CM) - Weakness  THERAPY DIAG:  No diagnosis found.  Rationale for Evaluation and Treatment: Rehabilitation  ONSET DATE: ***  SUBJECTIVE:   SUBJECTIVE STATEMENT: ***  PERTINENT HISTORY: *** PAIN:  Are you having pain? {OPRCPAIN:27236}  PRECAUTIONS: {Therapy precautions:24002}  RED FLAGS: {PT Red Flags:29287}   WEIGHT BEARING RESTRICTIONS: {Yes ***/No:24003}  FALLS:  Has patient fallen in last 6 months? {fallsyesno:27318}  LIVING ENVIRONMENT: Lives with: {OPRC lives with:25569::"lives with their family"} Lives in: {Lives in:25570} Stairs: {opstairs:27293} Has following equipment at home: {Assistive devices:23999}  OCCUPATION: ***  PLOF: {PLOF:24004}  PATIENT GOALS: ***  NEXT MD VISIT: ***  OBJECTIVE:  Note: Objective measures were completed at Evaluation unless otherwise  noted.  DIAGNOSTIC FINDINGS: ***  PATIENT SURVEYS:  {rehab surveys:24030}  COGNITION: Overall cognitive status: {cognition:24006}     SENSATION: {sensation:27233}  EDEMA:  {edema:24020}  MUSCLE LENGTH: Hamstrings: Right *** deg; Left *** deg Maisie Fus test: Right *** deg; Left *** deg  POSTURE: {posture:25561}  PALPATION: ***  LOWER EXTREMITY ROM:  {AROM/PROM:27142} ROM Right eval Left eval  Hip flexion    Hip extension    Hip abduction    Hip adduction    Hip internal rotation    Hip external rotation    Knee flexion    Knee extension    Ankle dorsiflexion    Ankle plantarflexion    Ankle inversion    Ankle eversion     (Blank rows = not tested)  LOWER EXTREMITY MMT:  MMT Right eval Left eval  Hip flexion    Hip extension    Hip abduction    Hip adduction    Hip internal rotation    Hip external rotation    Knee flexion    Knee extension    Ankle dorsiflexion    Ankle plantarflexion    Ankle inversion    Ankle eversion     (Blank rows = not tested)  LOWER EXTREMITY SPECIAL TESTS:  {LEspecialtests:26242}  FUNCTIONAL TESTS:  {Functional tests:24029}  GAIT: Distance walked: *** Assistive device utilized: {Assistive devices:23999} Level of assistance: {Levels of  assistance:24026} Comments: ***   TODAY'S TREATMENT:                                                                                                                              DATE: ***    PATIENT EDUCATION:  Education details: *** Person educated: {Person educated:25204} Education method: {Education Method:25205} Education comprehension: {Education Comprehension:25206}  HOME EXERCISE PROGRAM: ***  ASSESSMENT:  CLINICAL IMPRESSION: Patient is a *** y.o. *** who was seen today for physical therapy evaluation and treatment for ***.   OBJECTIVE IMPAIRMENTS: {opptimpairments:25111}.   ACTIVITY LIMITATIONS: {activitylimitations:27494}  PARTICIPATION LIMITATIONS:  {participationrestrictions:25113}  PERSONAL FACTORS: {Personal factors:25162} are also affecting patient's functional outcome.   REHAB POTENTIAL: {rehabpotential:25112}  CLINICAL DECISION MAKING: {clinical decision making:25114}  EVALUATION COMPLEXITY: {Evaluation complexity:25115}   GOALS: Goals reviewed with patient? {yes/no:20286}  SHORT TERM GOALS: Target date: *** *** Baseline: Goal status: INITIAL  2.  *** Baseline:  Goal status: INITIAL  3.  *** Baseline:  Goal status: INITIAL  4.  *** Baseline:  Goal status: INITIAL  5.  *** Baseline:  Goal status: INITIAL  6.  *** Baseline:  Goal status: INITIAL  LONG TERM GOALS: Target date: ***  *** Baseline:  Goal status: INITIAL  2.  *** Baseline:  Goal status: INITIAL  3.  *** Baseline:  Goal status: INITIAL  4.  *** Baseline:  Goal status: INITIAL  5.  *** Baseline:  Goal status: INITIAL  6.  *** Baseline:  Goal status: INITIAL   PLAN:  PT FREQUENCY: {rehab frequency:25116}  PT DURATION: {rehab duration:25117}  PLANNED INTERVENTIONS: {rehab planned interventions:25118::"97110-Therapeutic exercises","97530- Therapeutic [phone]- Neuromuscular re-education","97535- Self EPPI","95188- Manual therapy"}  PLAN FOR NEXT SESSION: ***   Fia Hebert April Ma L Abdullahi Vallone, PT 06/02/2023, 7:58 AM

## 2023-06-02 NOTE — Therapy (Deleted)
OUTPATIENT OCCUPATIONAL THERAPY NEURO EVALUATION  Patient Name: [name] MRN: [id number] DOB:[birth year], 52 y.o., female Today's Date: 06/02/2023  PCP: Verlon Au, MD  REFERRING PROVIDER: Alba Cory, MD   END OF SESSION:   Past Medical History:  Diagnosis Date   Acute renal failure (HCC)     Intractable nausea vomiting secondary to diabetic gastroparesis causing dehydration and acute renal failure /notes 04/01/2013   Anginal pain (HCC)    Anxiety    Bell's palsy 08/09/2010   Bicornuate uterus    CAP (community acquired pneumonia) 10/2011   Hattie Perch 11/08/2011   Chest pain 01/13/2014   CHF (congestive heart failure) (HCC)    Diabetic gastroparesis associated with type 2 diabetes mellitus (HCC)    this is presumed diagnoses, not confirmed by any studies.    Eczema    Family history of breast cancer    Family history of malignant neoplasm of breast    Family history of malignant neoplasm of ovary    Family history of ovarian cancer    Family history of prostate cancer    GERD (gastroesophageal reflux disease)    High cholesterol    Hypertension    Liver lesion 08/13/2019   Liver mass 2014   biopsied 03/2013 at [facility], not malignant.  is to undergo radiologic ablation of the mass in sept/October 2014.    Lupus (HCC)    Migraines    "maybe a couple times/yr" (04/01/2013)   Obesity    Obstructive sleep apnea on CPAP 2011   Oxygen at nights   Pulmonary embolism (HCC) 05/21/2021   SJOGREN'S SYNDROME 08/09/2010   SLE (systemic lupus erythematosus) (HCC)    Hattie Perch 12/01/2010   Past Surgical History:  Procedure Laterality Date   BREAST CYST EXCISION Left 08/2005   epidermoid   CARDIAC CATHETERIZATION  02/07/2012   ECTOPIC PREGNANCY SURGERY  1999   ESOPHAGOGASTRODUODENOSCOPY N/A 02/26/2013   Procedure: ESOPHAGOGASTRODUODENOSCOPY (EGD);  Surgeon: Hilarie Fredrickson, MD;  Location: [facility] ENDOSCOPY;  Service: Endoscopy;  Laterality: N/A;    ESOPHAGOGASTRODUODENOSCOPY N/A 03/02/2015   Procedure: ESOPHAGOGASTRODUODENOSCOPY (EGD);  Surgeon: Rachael Fee, MD;  Location: [facility] ENDOSCOPY;  Service: Endoscopy;  Laterality: N/A;   ESOPHAGOGASTRODUODENOSCOPY N/A 02/23/2021   Procedure: ESOPHAGOGASTRODUODENOSCOPY (EGD);  Surgeon: Charlott Rakes, MD;  Location: [facility] ENDOSCOPY;  Service: Endoscopy;  Laterality: N/A;  possible dilation   HERNIA REPAIR     LEFT AND RIGHT HEART CATHETERIZATION WITH CORONARY ANGIOGRAM N/A 02/07/2012   Procedure: LEFT AND RIGHT HEART CATHETERIZATION WITH CORONARY ANGIOGRAM;  Surgeon: Pamella Pert, MD;  Location: [facility] CATH LAB;  Service: Cardiovascular;  Laterality: N/A;   LIVER BIOPSY  03/2013   liver mass/medical hx noted above   MUSCLE BIOPSY     for lupus/notes 12/01/2010   UMBILICAL HERNIA REPAIR  1980's   Patient Active Problem List   Diagnosis Date Noted   Malnutrition of moderate degree 04/27/2023   Anxiety and depression 04/25/2023   Ileus (HCC) 11/16/2022   Hypokalemia 11/16/2022   Hypomagnesemia 11/16/2022   Hypothyroidism 11/16/2022   Oral candidiasis 11/16/2022   History of venous thromboembolism 11/16/2022   Severe sepsis (HCC) 11/09/2022   Acute metabolic encephalopathy 10/24/2022   Intra-abdominal abscess (HCC) 10/24/2022   Nausea & vomiting 05/09/2022   Vitamin B12 deficiency 01/04/2022   History of pulmonary embolism 12/22/2021   Normocytic anemia 12/22/2021   Foot pain, bilateral 11/22/2021   Genetic testing 08/17/2021   Family history of breast cancer 07/19/2021   Family history of  prostate cancer 07/19/2021   Esophageal dysmotility 06/03/2021   Physical debility 08/29/2019   Obesity hypoventilation syndrome (HCC) 08/23/2019   Skin breakdown 08/23/2019   Liver hemorrhage 08/23/2019   History of CVA (cerebrovascular accident) 08/19/2019   Mood disorder (HCC) 08/19/2019   Morbid obesity with BMI of 50.0-59.9, adult (HCC) 08/19/2019   SLE (systemic lupus erythematosus related  syndrome) (HCC) 08/19/2019   Hepatic adenoma    Sciatica 04/20/2018   Type 2 diabetes mellitus with hyperlipidemia (HCC) 09/19/2015   Radiculopathy of cervical spine    Family history of malignant neoplasm of breast    Family history of malignant neoplasm of ovary    Intractable nausea and vomiting 11/11/2013   Septate uterus 09/08/2013   Diabetic gastroparesis (HCC) 04/28/2013   Constipation due to pain medication 04/21/2013   Odynophagia 04/04/2013   AKI (acute kidney injury) (HCC) 04/01/2013   Liver masses 02/28/2013   Generalized abdominal pain 02/26/2013   Reflux esophagitis 02/26/2013   Morbid obesity (HCC) 10/10/2012   Chronic diastolic CHF (congestive heart failure) (HCC) 10/09/2012   Clinical polymyositis syndrome (HCC) 09/06/2012   Mononeuritis of lower limb 01/12/2012   Chronic combined systolic and diastolic CHF (congestive heart failure) (HCC) 11/08/2011   Essential hypertension 11/08/2011   HLD (hyperlipidemia) 07/19/2011   Bell's palsy 08/09/2010   SJOGREN'S SYNDROME 08/09/2010    ONSET DATE: 05/03/23 (referral date)  REFERRING DIAG:  R11.2 (ICD-10-CM) - Intractable nausea and vomiting  R53.1 (ICD-10-CM) - Weakness    THERAPY DIAG:  No diagnosis found.  Rationale for Evaluation and Treatment: Rehabilitation  SUBJECTIVE:   SUBJECTIVE STATEMENT: *** Pt accompanied by: {accompnied:27141}  PERTINENT HISTORY: Intractable nausea and vomiting, SJOGREN'S SYNDROME, Chronic diastolic CHF (congestive heart failure), Type 2 diabetes mellitus with hyperlipidemia, Essential hypertension, hx of pulmonary embolism, lupus, BMI 55 obesity, Hypokalemia, Anxiety and depression, Malnutrition of moderate degree  Per 05/03/23 D/C Summary: CT scan abdomen and pelvis did not show any acute finding. She has had a normal gastric emptying study in 2021.    Assessment and Plan: 1-intractable nausea vomiting due to diabetes gastroparesis  in setting gastroparesis. Improved with  Reglan.  Tolerating diet. Discharge home.  She has chronic abdominal pain, she will need follow up with her PCP. CT abdomen pelvis negative.   PRECAUTIONS: {Therapy precautions:24002}  WEIGHT BEARING RESTRICTIONS: {Yes ***/No:24003}  PAIN:  Are you having pain? {OPRCPAIN:27236}  FALLS: Has patient fallen in last 6 months? {fallsyesno:27318}  LIVING ENVIRONMENT: Lives with: {OPRC lives with:25569::"lives with their family"} Lives in: {Lives in:25570} Stairs: {opstairs:27293} Has following equipment at home: {Assistive devices:23999}  PLOF: {PLOF:24004}  PATIENT GOALS: ***  OBJECTIVE:  Note: Objective measures were completed at Evaluation unless otherwise noted.  HAND DOMINANCE: {MISC; OT HAND DOMINANCE:[phone]}  ADLs: Overall ADLs: *** Transfers/ambulation related to ADLs: Eating: *** Grooming: *** UB Dressing: *** LB Dressing: *** Toileting: *** Bathing: *** Tub Shower transfers: *** Equipment: {equipment:25573}  IADLs: Shopping: *** Light housekeeping: *** Meal Prep: *** Community mobility: *** Medication management: *** Financial management: *** Handwriting: {OTWRITTENEXPRESSION:25361}  MOBILITY STATUS: {OTMOBILITY:25360}  POSTURE COMMENTS:  {posture:25561} Sitting balance: {sitting balance:25483}  ACTIVITY TOLERANCE: Activity tolerance: ***  FUNCTIONAL OUTCOME MEASURES: {OTFUNCTIONALMEASURES:27238}  UPPER EXTREMITY ROM:    {AROM/PROM:27142} ROM Right eval Left eval  Shoulder flexion    Shoulder abduction    Shoulder adduction    Shoulder extension    Shoulder internal rotation    Shoulder external rotation    Elbow flexion    Elbow extension    Wrist  flexion    Wrist extension    Wrist ulnar deviation    Wrist radial deviation    Wrist pronation    Wrist supination    (Blank rows = not tested)  UPPER EXTREMITY MMT:     MMT Right eval Left eval  Shoulder flexion    Shoulder abduction    Shoulder adduction    Shoulder  extension    Shoulder internal rotation    Shoulder external rotation    Middle trapezius    Lower trapezius    Elbow flexion    Elbow extension    Wrist flexion    Wrist extension    Wrist ulnar deviation    Wrist radial deviation    Wrist pronation    Wrist supination    (Blank rows = not tested)  HAND FUNCTION: {handfunction:27230}  COORDINATION: {otcoordination:27237}  SENSATION: {sensation:27233}  EDEMA: ***  MUSCLE TONE: {UETONE:25567}  COGNITION: Overall cognitive status: {cognition:24006}  VISION: Subjective report: *** Baseline vision: {OTBASELINEVISION:25363} Visual history: {OTVISUALHISTORY:25364}  VISION ASSESSMENT: {visionassessment:27231}  Patient has difficulty with following activities due to following visual impairments: ***  PERCEPTION: {Perception:25564}  PRAXIS: {Praxis:25565}  OBSERVATIONS: ***   TODAY'S TREATMENT:                                                                                                                              DATE: ***   PATIENT EDUCATION: Education details: *** Person educated: {Person educated:25204} Education method: {Education Method:25205} Education comprehension: {Education Comprehension:25206}  HOME EXERCISE PROGRAM: ***   GOALS: Goals reviewed with patient? {yes/no:20286}  SHORT TERM GOALS: Target date: ***  *** Baseline: Goal status: INITIAL  2.  *** Baseline:  Goal status: INITIAL  3.  *** Baseline:  Goal status: INITIAL  4.  *** Baseline:  Goal status: INITIAL  5.  *** Baseline:  Goal status: INITIAL  6.  *** Baseline:  Goal status: INITIAL  LONG TERM GOALS: Target date: ***  *** Baseline:  Goal status: INITIAL  2.  *** Baseline:  Goal status: INITIAL  3.  *** Baseline:  Goal status: INITIAL  4.  *** Baseline:  Goal status: INITIAL  5.  *** Baseline:  Goal status: INITIAL  6.  *** Baseline:  Goal status: INITIAL  ASSESSMENT:  CLINICAL  IMPRESSION: Patient is a *** y.o. *** who was seen today for occupational therapy evaluation for ***.   PERFORMANCE DEFICITS: in functional skills including {OT physical skills:25468}, cognitive skills including {OT cognitive skills:25469}, and psychosocial skills including {OT psychosocial skills:25470}.   IMPAIRMENTS: are limiting patient from {OT performance deficits:25471}.   CO-MORBIDITIES: {Comorbidities:25485} that affects occupational performance. Patient will benefit from skilled OT to address above impairments and improve overall function.  MODIFICATION OR ASSISTANCE TO COMPLETE EVALUATION: {OT modification:25474}  OT OCCUPATIONAL PROFILE AND HISTORY: {OT PROFILE AND HISTORY:25484}  CLINICAL DECISION MAKING: {OT CDM:25475}  REHAB POTENTIAL: {rehabpotential:25112}  EVALUATION COMPLEXITY: {Evaluation complexity:25115}    PLAN:  OT FREQUENCY: {rehab  frequency:25116}  OT DURATION: {rehab duration:25117}  PLANNED INTERVENTIONS: {OT Interventions:25467}  RECOMMENDED OTHER SERVICES: ***  CONSULTED AND AGREED WITH PLAN OF CARE: {NUU:72536}  PLAN FOR NEXT SESSION: ***   Wynetta Emery, OT 06/02/2023, 7:39 AM

## 2023-06-15 ENCOUNTER — Telehealth: Payer: Self-pay | Admitting: Cardiology

## 2023-06-15 NOTE — Telephone Encounter (Signed)
Refill req for Amlodipine 10mg  which was discontinued per [facility] discharge summary 04/24/23. Per pt, she was told in hosp to restart after discharged stating the stop was only temporary. She has restarted medication and requesting rf. Was she supposed to restart or advise???

## 2023-06-15 NOTE — Telephone Encounter (Signed)
Spoke with patient and she was requesting a refill om her amlodipine 10 mg. Discharge instructions from hospital stated to stop amlodipine, carvedilol, and isosorbide-hydralazine.  Patient states she has still been taking all of those medications.  She states she will not stop taking unless you say its ok.  Can you review her hospital visit and let me know if its ok to send refill on amlodipine. Also do she need to continue the other medications as well because they are no longer on her medication list.

## 2023-06-16 NOTE — Telephone Encounter (Signed)
I reviewed the chart and if she has been taking the medications, I am fine to renew the meds. She has low BP due to UTI. Also Dr. Leavy Cella sent 30 day Rx yesterday or day before. Can you have her come in electively for BP management

## 2023-06-16 NOTE — Telephone Encounter (Signed)
Just routine, either me or one of PA/NP for a quick check and medication reconciliation, please have her bring all the medications

## 2023-06-17 ENCOUNTER — Other Ambulatory Visit: Payer: Self-pay | Admitting: Cardiology

## 2023-06-17 DIAGNOSIS — I5032 Chronic diastolic (congestive) heart failure: Secondary | ICD-10-CM

## 2023-06-19 ENCOUNTER — Telehealth: Payer: Self-pay | Admitting: Cardiology

## 2023-06-19 DIAGNOSIS — I10 Essential (primary) hypertension: Secondary | ICD-10-CM

## 2023-06-19 DIAGNOSIS — I5032 Chronic diastolic (congestive) heart failure: Secondary | ICD-10-CM

## 2023-06-19 MED ORDER — AMLODIPINE BESYLATE 10 MG PO TABS
10.0000 mg | ORAL_TABLET | Freq: Every day | ORAL | 3 refills | Status: DC
Start: 2023-06-19 — End: 2023-07-20

## 2023-06-19 NOTE — Telephone Encounter (Signed)
Left voicemail to return call to office.

## 2023-06-19 NOTE — Telephone Encounter (Signed)
Pt c/o medication issue:  1. Name of Medication:   sacubitril-valsartan (ENTRESTO) 97-103 MG   2. How are you currently taking this medication (dosage and times per day)?   As prescribed  3. Are you having a reaction (difficulty breathing--STAT)?   No  4. What is your medication issue?    Patient stated she has still been taking this medication and wants a call back directly from Dr. Jacinto Halim,

## 2023-06-19 NOTE — Telephone Encounter (Signed)
Yes, please refill the medication as she has continue all her meds and states asymptomatic. As noted previously, will need a visit to f/u on medications and may need BMP and BNP prior to her visit. Indication chronic diastolic heart failure.

## 2023-06-19 NOTE — Telephone Encounter (Addendum)
Spoke with patient and she states she went to gave entresto refilled and pharmacy stated your provider have d/c this medication.  Are you ok to refill entresto 97-103

## 2023-06-20 ENCOUNTER — Other Ambulatory Visit: Payer: Self-pay | Admitting: *Deleted

## 2023-06-20 DIAGNOSIS — I10 Essential (primary) hypertension: Secondary | ICD-10-CM

## 2023-06-20 DIAGNOSIS — I5032 Chronic diastolic (congestive) heart failure: Secondary | ICD-10-CM

## 2023-06-20 NOTE — Telephone Encounter (Signed)
Prescription has been sent to patient's pharmacy.  Patient has appointment with Dr Jacinto Halim on 12/19.  I spoke with patient.  She will have lab work done on 12/12

## 2023-06-20 NOTE — Telephone Encounter (Signed)
Amlodipine has been sent to pharmacy.  Patient has appointment with Dr Jacinto Halim on 12/19

## 2023-07-20 ENCOUNTER — Encounter: Payer: Self-pay | Admitting: Cardiology

## 2023-07-20 ENCOUNTER — Ambulatory Visit: Payer: Medicaid Other | Attending: Cardiology | Admitting: Cardiology

## 2023-07-20 VITALS — BP 110/62 | HR 90 | Resp 16 | Ht 68.0 in | Wt 325.4 lb

## 2023-07-20 DIAGNOSIS — I10 Essential (primary) hypertension: Secondary | ICD-10-CM | POA: Diagnosis not present

## 2023-07-20 DIAGNOSIS — D638 Anemia in other chronic diseases classified elsewhere: Secondary | ICD-10-CM

## 2023-07-20 DIAGNOSIS — I5032 Chronic diastolic (congestive) heart failure: Secondary | ICD-10-CM

## 2023-07-20 NOTE — Patient Instructions (Addendum)
Medication Instructions:  Your physician has recommended you make the following change in your medication:  Stop amlodipine  *If you need a refill on your cardiac medications before your next appointment, please call your pharmacy*   Lab Work: Have lab work done at [facility] on the first floor of our building today--B12, BMP, CBC, BNP  (BMP and BNP were ordered previously) If you have labs (blood work) drawn today and your tests are completely normal, you will receive your results only by: MyChart Message (if you have MyChart) OR A paper copy in the mail If you have any lab test that is abnormal or we need to change your treatment, we will call you to review the results.   Testing/Procedures: none   Follow-Up: At [facility], you and your health needs are our priority.  As part of our continuing mission to provide you with exceptional heart care, we have created designated Provider Care Teams.  These Care Teams include your primary Cardiologist (physician) and Advanced Practice Providers (APPs -  Physician Assistants and Nurse Practitioners) who all work together to provide you with the care you need, when you need it.  We recommend signing up for the patient portal called "MyChart".  Sign up information is provided on this After Visit Summary.  MyChart is used to connect with patients for Virtual Visits (Telemedicine).  Patients are able to view lab/test results, encounter notes, upcoming appointments, etc.  Non-urgent messages can be sent to your provider as well.   To learn more about what you can do with MyChart, go to [email or web address].    Your next appointment:   12 month(s)  Provider:   Yates Decamp, MD     Other Instructions

## 2023-07-20 NOTE — Progress Notes (Signed)
Cardiology Office Note:  .   Date:  07/20/2023  ID:  [name], DOB 1970/10/10, MRN [id number] PCP: Verlon Au, MD   [facility] Providers Cardiologist:  Yates Decamp, MD   History of Present Illness: .   [name] is a 52 y.o. African-American female  with Sjogren's syndrome, SLE, nonischemic cardiomyopathy, severe LVEF in 2018, and again in 2021, with intermittent improvement in LVEF by echo, last echocardiogram in February 2023 revealing preserved LVEF, EF improved on guideline directed medical therapy with maximum dose of Entresto, Jardiance and BiDil.  She also has morbid obesity, obesity hypoventilation syndrome, uncontrolled type 2 DM, hypertension, h/o CVA in 2014, OSA on CPAP and hepatic  adenoma s/p embolization on 08/12/19.    She also developed bilateral pulmonary emboli within the proximal segment pulmonary arterioles with severe clot burden and evidence of RV strain consistent with submassive PE in September 2022 when she was admitted with pneumonia and is on chronic anticoagulation with Eliquis.  Her latest hospitalization was on 04/24/2023 through 05/03/2023 with intractable nausea and vomiting and diabetic gastroparesis and UTI along with acute kidney injury and dehydration.  She was discharged home with recommendation to stop antihypertensive medication including amlodipine 10 mg, carvedilol 25 mg twice daily and BiDil 1 p.o. 3 times daily.   She presents for annual visit.  Discussed the use of AI scribe software for clinical note transcription with the patient, who gave verbal consent to proceed.  History of Present Illness   The patient, with a complex medical history including heart failure, diabetes, anemia, and Sjogren's syndrome, presents with confusion regarding her medications. She reports that Cervidil was removed from her chart without her knowledge, but she has continued taking it as she was not instructed to stop. She has been dealing with  significant personal challenges, including homelessness and frequent hospitalizations due to unexplained vomiting. Despite these challenges, her diabetes has been well-controlled, and her heart function has improved with medication. She has been diagnosed with anemia, but has not been given any treatment for it. She also reports arthritis in her left knee, which has been causing her discomfort.      Review of Systems  Cardiovascular:  Positive for dyspnea on exertion (chronic). Negative for chest pain and leg swelling.   Labs   Lab Results  Component Value Date   CHOL 93 02/21/2022   HDL 37.00 (L) 02/21/2022   LDLCALC 45 02/21/2022   TRIG 83 11/17/2022   CHOLHDL 3 02/21/2022   Lab Results  Component Value Date   NA 136 05/01/2023   K 4.2 05/01/2023   CO2 22 05/01/2023   GLUCOSE 174 (H) 05/01/2023   BUN 10 05/01/2023   CREATININE 0.96 05/01/2023   CALCIUM 8.0 (L) 05/01/2023   GFR 81.44 05/23/2022   GFRNONAA >60 05/01/2023      Latest Ref Rng & Units 05/01/2023    5:54 AM 04/30/2023    4:51 AM 04/29/2023    7:34 AM  BMP  Glucose 70 - 99 mg/dL 846  962  952   BUN 6 - 20 mg/dL 10  10  <5   Creatinine 0.44 - 1.00 mg/dL 8.41  3.24  4.01   Sodium 135 - 145 mmol/L 136  134  135   Potassium 3.5 - 5.1 mmol/L 4.2  4.1  3.6   Chloride 98 - 111 mmol/L 110  108  107   CO2 22 - 32 mmol/L 22  17  20    Calcium  8.9 - 10.3 mg/dL 8.0  7.9  8.2       Latest Ref Rng & Units 04/28/2023   10:37 AM 04/27/2023    5:39 AM 04/26/2023    4:31 AM  CBC  WBC 4.0 - 10.5 K/uL 6.6  8.4  8.0   Hemoglobin 12.0 - 15.0 g/dL 9.2  9.6  9.5   Hematocrit 36.0 - 46.0 % 29.4  31.1  30.5   Platelets 150 - 400 K/uL 162  172  198    Lab Results  Component Value Date   HGBA1C 7.3 (H) 04/25/2023    09/21/2021  External Labs:  Labs 08/02/2022:  Total cholesterol 105, triglycerides 79, HDL 43, LDL 46.  Hb 10.9/HCT 35.2, platelets 272.  CBC stable since December 2021.  Sodium 141, potassium 3.6, BUN 11,  creatinine 0.99, EGFR 69 mL, LFTs normal.  TSH normal at 1.98.  Vitamin D is severely reduced at 9.    Physical Exam:   VS:  BP 110/62 (BP Location: Right Wrist, Patient Position: Sitting, Cuff Size: Large)   Pulse 90   Resp 16   Ht 5\' 8"  (1.727 m)   Wt (!) 325 lb 6.4 oz (147.6 kg)   LMP 03/24/2017   SpO2 99%   BMI 49.48 kg/m    Wt Readings from Last 3 Encounters:  07/20/23 (!) 325 lb 6.4 oz (147.6 kg)  05/03/23 (!) 320 lb 5.3 oz (145.3 kg)  11/19/22 (!) 360 lb 14.3 oz (163.7 kg)    Physical Exam Constitutional:      Comments: Morbidly obese in no acute distress.  Neck:     Vascular: No carotid bruit or JVD.     Comments: Short neck Cardiovascular:     Rate and Rhythm: Normal rate and regular rhythm.     Pulses: Intact distal pulses.          Carotid pulses are 2+ on the right side and 2+ on the left side.    Heart sounds: Normal heart sounds. No murmur heard.    No gallop.  Pulmonary:     Effort: Pulmonary effort is normal.     Breath sounds: Normal breath sounds.  Abdominal:     General: Bowel sounds are normal.     Palpations: Abdomen is soft.     Comments: Obese. Pannus present  Musculoskeletal:     Right lower leg: No edema.     Left lower leg: No edema.    Studies Reviewed: Marland Kitchen    ECHOCARDIOGRAM COMPLETE 10/24/2022  1. Left ventricular ejection fraction, by estimation, is 55 to 60%. The left ventricle has normal function. Left ventricular endocardial border not optimally defined to evaluate regional wall motion. There is mild concentric left ventricular hypertrophy. Left ventricular diastolic parameters are indeterminate. 2. Right ventricular systolic function is normal. The right ventricular size is normal. There is normal pulmonary artery systolic pressure. 3. The mitral valve was not well visualized. Trivial mitral valve regurgitation. No evidence of mitral stenosis. 4. The aortic valve is tricuspid. There is mild calcification of the aortic valve. Aortic valve  regurgitation is not visualized. Aortic valve sclerosis/calcification is present, without any evidence of aortic stenosis. Aortic valve area, by VTI measures 2.08 cm. Aortic valve mean gradient measures 5.0 mmHg. Aortic valve Vmax measures 1.46 m/s. 5. The inferior vena cava is normal in size with greater than 50% respiratory variability, suggesting right atrial pressure of 3 mmHg. 6. Very difficult images due to poor sound wave transmission. LVEF appears normal  but endocardium not well seen.  EKG:    EKG 04/24/2023: Normal sinus rhythm at rate of 94 bpm, normal axis, low voltage complexes.  Nonspecific T abnormality. No significant change from 09/21/2022.  Medications and allergies    Allergies  Allergen Reactions   Metoclopramide Other (See Comments)    Developed restless leg, akathisia type limb movements.    Codeine Itching and Rash   Hydrocodone Rash   Percocet [Oxycodone-Acetaminophen] Hives and Rash    Tolerates dilaudid Tolerates acetaminophen      Current Outpatient Medications:    ADVAIR HFA 230-21 MCG/ACT inhaler, Inhale 2 puffs into the lungs 2 (two) times daily., Disp: , Rfl:    albuterol (PROVENTIL) (2.5 MG/3ML) 0.083% nebulizer solution, Take 2.5 mg by nebulization as needed for wheezing., Disp: , Rfl:    ALPRAZolam (XANAX) 0.5 MG tablet, Take 0.5 mg by mouth as needed for anxiety or sleep., Disp: , Rfl:    apixaban (ELIQUIS) 2.5 MG TABS tablet, Take 1 tablet (2.5 mg total) by mouth 2 (two) times daily., Disp: 180 tablet, Rfl: 3   ARIPiprazole (ABILIFY) 5 MG tablet, Take 5 mg by mouth daily., Disp: , Rfl:    Azathioprine 75 MG TABS, Take 1 tablet by mouth 2 (two) times daily., Disp: , Rfl:    B-D UF III MINI PEN NEEDLES 31G X 5 MM MISC, USE AS DIRECTED TWICE A DAY, Disp: 100 each, Rfl: 2   buPROPion (WELLBUTRIN XL) 300 MG 24 hr tablet, Take 300 mg by mouth daily., Disp: , Rfl:    carvedilol (COREG) 25 MG tablet, Take 25 mg by mouth 2 (two) times daily with a meal., Disp:  , Rfl:    Continuous Blood Gluc Sensor (FREESTYLE LIBRE 3 SENSOR) MISC, 1 Device by Does not apply route every 14 (fourteen) days. Apply 1 sensor on upper arm every 14 days for continuous glucose monitoring, Disp: 2 each, Rfl: 2   cromolyn (OPTICROM) 4 % ophthalmic solution, Place 1 drop into both eyes 2 (two) times daily., Disp: , Rfl:    cycloSPORINE (RESTASIS) 0.05 % ophthalmic emulsion, Place 1 drop into both eyes in the morning, at noon, and at bedtime. , Disp: , Rfl:    diclofenac Sodium (VOLTAREN) 1 % GEL, APPLY 2 GRAMS TO AFFECTED AREA 4 TIMES A DAY (Patient taking differently: Apply 2 g topically 4 (four) times daily as needed (pain).), Disp: 400 g, Rfl: 1   diphenhydrAMINE (BENADRYL) 50 MG tablet, Take 0.5 tablets (25 mg total) by mouth every 6 (six) hours as needed for itching., Disp: 30 tablet, Rfl: 0   EASY TOUCH PEN NEEDLES 32G X 4 MM MISC, USE TO INJECT INSULIN TWICE DAILY, Disp: 100 each, Rfl: 9   empagliflozin (JARDIANCE) 10 MG TABS tablet, Take 1 tablet by mouth daily., Disp: , Rfl:    famotidine (PEPCID) 20 MG tablet, Take 20 mg by mouth 2 (two) times daily., Disp: , Rfl:    fluticasone (FLONASE) 50 MCG/ACT nasal spray, Place 2 sprays into both nostrils continuous as needed for allergies or rhinitis., Disp: , Rfl:    glucose blood (ACCU-CHEK GUIDE) test strip, Use as instructed, Disp: 100 strip, Rfl: 0   glucose blood (ONETOUCH ULTRA) test strip, Test blood sugar 4 times a day, Disp: 100 each, Rfl: 12   hydroxychloroquine (PLAQUENIL) 200 MG tablet, Take 200 mg by mouth 2 (two) times daily., Disp: , Rfl:    ibuprofen (ADVIL) 600 MG tablet, Take 1 tablet (600 mg total) by mouth every  6 (six) hours as needed. (Patient taking differently: Take 600 mg by mouth every 6 (six) hours as needed for mild pain (pain score 1-3).), Disp: 30 tablet, Rfl: 0   insulin regular human CONCENTRATED (HUMULIN R U-500 KWIKPEN) 500 UNIT/ML KwikPen, 100 UNITS, 30 MINUTES BEFORE EACH MEAL, Disp: 6 mL, Rfl: 0    Insulin Syringe-Needle U-100 (EASY TOUCH INSULIN SYRINGE) 31G X 5/16" 1 ML MISC, USE AS DIRECTED THREE TIMES A DAY, Disp: 100 each, Rfl: 10   isosorbide-hydrALAZINE (BIDIL) 20-37.5 MG tablet, Take 1 tablet by mouth 3 (three) times daily., Disp: , Rfl:    Lancets (ONETOUCH ULTRASOFT) lancets, Test blood sugar 4 times a day, Disp: 100 each, Rfl: 12   levothyroxine (SYNTHROID) 50 MCG tablet, Take 1 tablet (50 mcg total) by mouth daily at 6 (six) AM., Disp:  , Rfl:    meclizine (ANTIVERT) 12.5 MG tablet, Take 1 tablet (12.5 mg total) by mouth 3 (three) times daily as needed for dizziness., Disp: 30 tablet, Rfl: 0   megestrol (MEGACE) 40 MG tablet, Take 40 mg by mouth daily., Disp: , Rfl: 3   metFORMIN (GLUCOPHAGE-XR) 500 MG 24 hr tablet, Take 2 tablets (1,000 mg total) by mouth 2 (two) times daily., Disp: 180 tablet, Rfl: 3   metoCLOPramide (REGLAN) 10 MG tablet, Take 1 tablet (10 mg total) by mouth 3 (three) times daily before meals., Disp: 90 tablet, Rfl: 0   Multiple Vitamin (MULTIVITAMIN WITH MINERALS) TABS tablet, Take 1 tablet by mouth daily., Disp:  , Rfl:    ondansetron (ZOFRAN-ODT) 4 MG disintegrating tablet, Take 1 tablet (4 mg total) by mouth every 8 (eight) hours as needed for nausea or vomiting., Disp: 10 tablet, Rfl: 0   oxybutynin (DITROPAN XL) 15 MG 24 hr tablet, Take 15 mg by mouth daily., Disp: , Rfl:    pantoprazole (PROTONIX) 40 MG tablet, Take 1 tablet (40 mg total) by mouth daily at 12 noon., Disp: 30 tablet, Rfl: 0   pregabalin (LYRICA) 75 MG capsule, Take 75 mg 3 (three) times daily by mouth. , Disp: , Rfl:    QUEtiapine (SEROQUEL) 300 MG tablet, Take 300 mg by mouth at bedtime., Disp: , Rfl:    sacubitril-valsartan (ENTRESTO) 97-103 MG, TAKE 1 TABLET BY MOUTH TWICE A DAY, Disp: 60 tablet, Rfl: 1   spironolactone (ALDACTONE) 50 MG tablet, Take 50 mg by mouth daily., Disp: , Rfl:    sucralfate (CARAFATE) 1 GM/10ML suspension, Take 10 mLs by mouth 2 (two) times daily as needed  (abdomen pain)., Disp: , Rfl:    tiZANidine (ZANAFLEX) 4 MG capsule, Take 4 mg by mouth 3 (three) times daily., Disp: , Rfl:    topiramate (TOPAMAX) 25 MG tablet, Take 75 mg by mouth at bedtime., Disp: , Rfl:    torsemide (DEMADEX) 20 MG tablet, Take 1 tablet (20 mg total) by mouth at bedtime., Disp: 90 tablet, Rfl: 3   traMADol (ULTRAM) 50 MG tablet, Take 1 tablet (50 mg total) by mouth every 6 (six) hours as needed., Disp: 20 tablet, Rfl: 0   TRANSDERM-SCOP 1 MG/3DAYS, Place 1 patch (1.5 mg total) onto the skin every 3 (three) days. As needed for nausea, Disp: 10 patch, Rfl: 0   Vitamin D, Ergocalciferol, (DRISDOL) 1.25 MG (50000 UNIT) CAPS capsule, Take 50,000 Units by mouth once a week., Disp: , Rfl:    rosuvastatin (CRESTOR) 20 MG tablet, Take 1 tablet (20 mg total) by mouth daily., Disp: 90 tablet, Rfl: 3   ASSESSMENT AND  PLAN: .      ICD-10-CM   1. Chronic diastolic congestive heart failure (HCC)  I50.32     2. Essential hypertension  I10     3. Anemia of chronic disease  D63.8 B12    CBC     Assessment and Plan    Non-ischemic Cardiomyopathy and chronic diastolic heart failure EF improved to normal on guideline directed medical therapy with maximum dose of Entresto, Jardiance, and Bidil. No current symptoms of heart failure. -Continue current medications. -Stop Amlodipine due to controlled blood pressure and potential contribution to anemia. -Return in 1 year for medication refills, or sooner if symptoms of heart failure develop.  Anemia of Chronic Disease Likely secondary to Sjogren's and Lupus. No current treatment. -Check B12 levels today.  Previously told to have B12 deficiency.  She could also have anemia of chronic disease.  No obvious GI bleeding. -Forward results to Dr. Virl Son for further management.  Diabetes melitis type II controlled Well controlled on current regimen including Jardiance. -Continue current medications.  Hypertension Well controlled on  current regimen. -Continue current medications, excluding Amlodipine which was stopped today..  General Health Maintenance Arthritis Complaints of severe arthritis in left knee. No current surgical plans. -No changes to current management. -Complete BMP and CBC today. -Forward results to Dr. Virl Son.    Signed,  Yates Decamp, MD, [facility] 07/20/2023, 9:59 PM [facility] [address] [place], [place] [address] Phone: [phone]. Fax:  [phone]

## 2023-07-21 LAB — PRO B NATRIURETIC PEPTIDE: NT-Pro BNP: 74 pg/mL (ref 0–249)

## 2023-07-21 LAB — BASIC METABOLIC PANEL
BUN/Creatinine Ratio: 13 (ref 9–23)
BUN: 15 mg/dL (ref 6–24)
CO2: 17 mmol/L — ABNORMAL LOW (ref 20–29)
Calcium: 9.4 mg/dL (ref 8.7–10.2)
Chloride: 113 mmol/L — ABNORMAL HIGH (ref 96–106)
Creatinine, Ser: 1.16 mg/dL — ABNORMAL HIGH (ref 0.57–1.00)
Glucose: 128 mg/dL — ABNORMAL HIGH (ref 70–99)
Potassium: 4.7 mmol/L (ref 3.5–5.2)
Sodium: 143 mmol/L (ref 134–144)
eGFR: 57 mL/min/{1.73_m2} — ABNORMAL LOW (ref >59)

## 2023-07-21 LAB — CBC
Hematocrit: 32.8 % — ABNORMAL LOW (ref 34.0–46.6)
Hemoglobin: 10.1 g/dL — ABNORMAL LOW (ref 11.1–15.9)
MCH: 27.2 pg (ref 26.6–33.0)
MCHC: 30.8 g/dL — ABNORMAL LOW (ref 31.5–35.7)
MCV: 88 fL (ref 79–97)
Platelets: 262 10*3/uL (ref 150–450)
RBC: 3.71 x10E6/uL — ABNORMAL LOW (ref 3.77–5.28)
RDW: 13.3 % (ref 11.7–15.4)
WBC: 8.9 10*3/uL (ref 3.4–10.8)

## 2023-07-21 LAB — VITAMIN B12: Vitamin B-12: 272 pg/mL (ref 232–1245)

## 2023-07-21 NOTE — Progress Notes (Signed)
Hemoglobin/blood counts are stable.  Renal function is stable with mild renal insufficiency, NT proBNP is normal and does not suggest heart failure, B12 is in the lower limit of normal, hence needs a supplement.  Patient to follow-up with PCP and discuss vitamin B-12 supplementation

## 2023-08-17 ENCOUNTER — Other Ambulatory Visit: Payer: Self-pay | Admitting: Cardiology

## 2023-08-17 DIAGNOSIS — I5032 Chronic diastolic (congestive) heart failure: Secondary | ICD-10-CM

## 2023-09-21 ENCOUNTER — Ambulatory Visit: Payer: Self-pay | Admitting: Cardiology

## 2023-09-22 ENCOUNTER — Ambulatory Visit: Payer: Medicaid Other | Admitting: Cardiology

## 2023-11-29 ENCOUNTER — Other Ambulatory Visit: Payer: Self-pay

## 2023-11-29 DIAGNOSIS — Z86711 Personal history of pulmonary embolism: Secondary | ICD-10-CM

## 2023-11-29 MED ORDER — APIXABAN 2.5 MG PO TABS
2.5000 mg | ORAL_TABLET | Freq: Two times a day (BID) | ORAL | 1 refills | Status: DC
Start: 2023-11-29 — End: 2024-02-29

## 2023-11-29 NOTE — Telephone Encounter (Signed)
 Received faxed Eliquis  refill request from [id number] in [place], [place].  Pt last saw Dr Berry Bristol 07/20/23, last labs 08/31/23 Creat 1.07, age 53, weight 147.6kg, pt is on low dose Eliquis  2.5mg  BID for Hx of PE per last OV note.  Will refill rx.

## 2023-12-22 ENCOUNTER — Telehealth: Payer: Self-pay | Admitting: Cardiology

## 2023-12-22 MED ORDER — SPIRONOLACTONE 50 MG PO TABS: 50.0000 mg | ORAL_TABLET | Freq: Every day | ORAL | 2 refills | Status: DC

## 2023-12-22 MED ORDER — CARVEDILOL 25 MG PO TABS: 25.0000 mg | ORAL_TABLET | Freq: Two times a day (BID) | ORAL | 2 refills | Status: DC

## 2023-12-22 NOTE — Telephone Encounter (Signed)
 Pt's medications were sent to pt's pharmacy as requested. Confirmation received.

## 2023-12-22 NOTE — Telephone Encounter (Signed)
*  STAT* If patient is at the pharmacy, call can be transferred to refill team.   1. Which medications need to be refilled? (please list name of each medication and dose if known)   carvedilol  (COREG ) 25 MG tablet  spironolactone  (ALDACTONE ) 50 MG tablet   2. Would you like to learn more about the convenience, safety, & potential cost savings by using the [facility]?   3. Are you open to using the [facility] (Type [facility]. ).  4. Which pharmacy/location (including street and city if local pharmacy) is medication to be sent to?  [id number] - [place], [place] - [address]   5. Do they need a 30 day or 90 day supply?  90 day  Patient stated she is completely out of these medications.

## 2024-01-16 ENCOUNTER — Other Ambulatory Visit: Payer: Self-pay | Admitting: Cardiology

## 2024-01-16 NOTE — Telephone Encounter (Signed)
*  STAT* If patient is at the pharmacy, call can be transferred to refill team.   1. Which medications need to be refilled? (please list name of each medication and dose if known)   Place 1 drop into both eyes in the morning, at noon, and at bedtime.    diclofenac  Sodium (VOLTAREN ) 1 % GEL    2. Which pharmacy/location (including street and city if local pharmacy) is medication to be sent to? [id number] - [place], [place] - [address] AT [facility] [address] Phone: [phone]  Fax: [phone]     3. Do they need a 30 day or 90 day supply? 90

## 2024-01-16 NOTE — Telephone Encounter (Signed)
Please send to patients PCP

## 2024-01-16 NOTE — Telephone Encounter (Signed)
 Pt's pharmacy is requesting a refill on medication diclofenac  Sodium (Voltaren ) 1% GEL. Would Dr. Berry Bristol like to refill this non cardiac medication? Please address

## 2024-01-17 ENCOUNTER — Telehealth: Payer: Self-pay | Admitting: Cardiology

## 2024-01-17 NOTE — Telephone Encounter (Signed)
*  STAT* If patient is at the pharmacy, call can be transferred to refill team.   1. Which medications need to be refilled? (please list name of each medication and dose if known)   diclofenac  Sodium (VOLTAREN ) 1 % GEL   2. Which pharmacy/location (including street and city if local pharmacy) is medication to be sent to?  [id number] - [place], [place] - [address]    3. Do they need a 30 day or 90 day supply? N/A   Was supposed to be requested yesterday in addition to other two medications that was sent in.

## 2024-01-17 NOTE — Telephone Encounter (Signed)
 Called pt to inform pt per Dr. Maida Sciara nurse Shira Dopp, RN    01/16/24  4:27 PM Note Please send to patient's PCP       01/16/24  4:00 PM You routed this conversation to Shira Dopp, RN  Me    01/16/24  4:00 PM Note Pt's pharmacy is requesting a refill on medication diclofenac  Sodium (Voltaren ) 1% GEL. Would Dr. Berry Bristol like to refill this non cardiac medication? Please address     I advised contact her PCP to request a refill on medication diclofenac  Sodium (Voltaren ) 1 % GEL. I informed pt that if she has any other problems, questions or concerns, to give our office a call back. Pt verbalized understanding.

## 2024-01-24 ENCOUNTER — Emergency Department (HOSPITAL_COMMUNITY)
Admission: EM | Admit: 2024-01-24 | Discharge: 2024-01-25 | Disposition: A | Discharge status: Home / Self Care | Attending: Emergency Medicine | Admitting: Emergency Medicine

## 2024-01-24 ENCOUNTER — Emergency Department (HOSPITAL_COMMUNITY)

## 2024-01-24 ENCOUNTER — Encounter (HOSPITAL_COMMUNITY): Payer: Self-pay

## 2024-01-24 ENCOUNTER — Other Ambulatory Visit: Payer: Self-pay

## 2024-01-24 DIAGNOSIS — M545 Low back pain, unspecified: Secondary | ICD-10-CM

## 2024-01-24 DIAGNOSIS — I509 Heart failure, unspecified: Secondary | ICD-10-CM | POA: Insufficient documentation

## 2024-01-24 DIAGNOSIS — I110 Hypertensive heart disease with heart failure: Secondary | ICD-10-CM | POA: Insufficient documentation

## 2024-01-24 DIAGNOSIS — Z794 Long term (current) use of insulin: Secondary | ICD-10-CM | POA: Diagnosis not present

## 2024-01-24 DIAGNOSIS — Z7901 Long term (current) use of anticoagulants: Secondary | ICD-10-CM | POA: Diagnosis not present

## 2024-01-24 DIAGNOSIS — J45909 Unspecified asthma, uncomplicated: Secondary | ICD-10-CM | POA: Insufficient documentation

## 2024-01-24 DIAGNOSIS — Z7984 Long term (current) use of oral hypoglycemic drugs: Secondary | ICD-10-CM | POA: Insufficient documentation

## 2024-01-24 DIAGNOSIS — Z7951 Long term (current) use of inhaled steroids: Secondary | ICD-10-CM | POA: Diagnosis not present

## 2024-01-24 DIAGNOSIS — N3000 Acute cystitis without hematuria: Secondary | ICD-10-CM | POA: Diagnosis not present

## 2024-01-24 DIAGNOSIS — E119 Type 2 diabetes mellitus without complications: Secondary | ICD-10-CM | POA: Diagnosis not present

## 2024-01-24 DIAGNOSIS — R0602 Shortness of breath: Secondary | ICD-10-CM | POA: Diagnosis present

## 2024-01-24 LAB — CBC WITH DIFFERENTIAL/PLATELET
Abs Immature Granulocytes: 0.04 10*3/uL (ref 0.00–0.07)
Basophils Absolute: 0 10*3/uL (ref 0.0–0.1)
Basophils Relative: 0 %
Eosinophils Absolute: 0.7 10*3/uL — ABNORMAL HIGH (ref 0.0–0.5)
Eosinophils Relative: 5 %
HCT: 35.9 % — ABNORMAL LOW (ref 36.0–46.0)
Hemoglobin: 10.8 g/dL — ABNORMAL LOW (ref 12.0–15.0)
Immature Granulocytes: 0 %
Lymphocytes Relative: 15 %
Lymphs Abs: 1.9 10*3/uL (ref 0.7–4.0)
MCH: 29 pg (ref 26.0–34.0)
MCHC: 30.1 g/dL (ref 30.0–36.0)
MCV: 96.2 fL (ref 80.0–100.0)
Monocytes Absolute: 1 10*3/uL (ref 0.1–1.0)
Monocytes Relative: 8 %
Neutro Abs: 8.6 10*3/uL — ABNORMAL HIGH (ref 1.7–7.7)
Neutrophils Relative %: 72 %
Platelets: 204 10*3/uL (ref 150–400)
RBC: 3.73 MIL/uL — ABNORMAL LOW (ref 3.87–5.11)
RDW: 14.7 % (ref 11.5–15.5)
WBC: 12.2 10*3/uL — ABNORMAL HIGH (ref 4.0–10.5)
nRBC: 0 % (ref 0.0–0.2)

## 2024-01-24 LAB — BRAIN NATRIURETIC PEPTIDE: B Natriuretic Peptide: 33 pg/mL (ref 0.0–100.0)

## 2024-01-24 NOTE — ED Provider Triage Note (Signed)
 Emergency Medicine Provider Triage Evaluation Note  [name] , a 53 y.o. female  was evaluated in triage.  Pt complains of left lower back pain plus patient is also been short of breath.  And short of breath for over a week.  Had the back pain for about a week.  No numbness or weakness in her left leg.  All the pain is in the low part of the back.  Patient denies any chest pain.  Patient does have a history of asthma normally has a nebulizer machine at home but she recently moved and nebulizer machine got lost.  Patient also says she is got a history of congestive heart failure..  Review of Systems  Positive: Shortness of breath and back pain Negative: Negative for any numbness or weakness.  Negative for chest pain  Physical Exam  BP 134/80   Pulse 92   Temp 98.2 F (36.8 C) (Oral)   Resp 18   Ht 1.727 m (5' 8)   Wt (!) 141.1 kg   LMP 03/24/2017   SpO2 100%   BMI 47.29 kg/m  Gen: Awake, no distress   Resp: Normal effort clear bilaterally no wheezing MSK: Moves extremities without difficulty .  No left-sided weakness.  Bilateral leg edema. Other:   Medical Decision Making  Medically screening exam initiated at 6:06 PM.  Appropriate orders placed.  [name] was informed that the remainder of the evaluation will be completed by another provider, this initial triage assessment does not replace that evaluation, and the importance of remaining in the ED until their evaluation is complete.  Patient stable for waiting room.  Will order x-ray of lumbar back we will get chest x-ray.  Will get labs.     [name] Kolek, MD 01/24/24 [phone]

## 2024-01-24 NOTE — ED Triage Notes (Signed)
 Pt to ED via [facility] from home c/o cough and back pain x 1 week. No medications given by EMS.   Last VS: hr 97, 97%o2, 18RR, BP 106 Palp, [id number]

## 2024-01-25 ENCOUNTER — Emergency Department (HOSPITAL_COMMUNITY)

## 2024-01-25 LAB — COMPREHENSIVE METABOLIC PANEL WITH GFR
ALT: 10 U/L (ref 0–44)
AST: 13 U/L — ABNORMAL LOW (ref 15–41)
Albumin: 3.5 g/dL (ref 3.5–5.0)
Alkaline Phosphatase: 83 U/L (ref 38–126)
Anion gap: 5 (ref 5–15)
BUN: 24 mg/dL — ABNORMAL HIGH (ref 6–20)
CO2: 18 mmol/L — ABNORMAL LOW (ref 22–32)
Calcium: 9.2 mg/dL (ref 8.9–10.3)
Chloride: 117 mmol/L — ABNORMAL HIGH (ref 98–111)
Creatinine, Ser: 1.57 mg/dL — ABNORMAL HIGH (ref 0.44–1.00)
GFR, Estimated: 39 mL/min — ABNORMAL LOW (ref >60)
Glucose, Bld: 199 mg/dL — ABNORMAL HIGH (ref 70–99)
Potassium: 5.5 mmol/L — ABNORMAL HIGH (ref 3.5–5.1)
Sodium: 140 mmol/L (ref 135–145)
Total Bilirubin: 0.9 mg/dL (ref 0.0–1.2)
Total Protein: 7.3 g/dL (ref 6.5–8.1)

## 2024-01-25 LAB — URINALYSIS, ROUTINE W REFLEX MICROSCOPIC
Bilirubin Urine: NEGATIVE
Glucose, UA: NEGATIVE mg/dL
Hgb urine dipstick: NEGATIVE
Ketones, ur: NEGATIVE mg/dL
Nitrite: POSITIVE — AB
Protein, ur: NEGATIVE mg/dL
Specific Gravity, Urine: 1.011 (ref 1.005–1.030)
pH: 6 (ref 5.0–8.0)

## 2024-01-25 MED ORDER — SODIUM CHLORIDE 0.9 % IV SOLN
1.0000 g | Freq: Once | INTRAVENOUS | Status: AC
  Administered 2024-01-25: 1 g via INTRAVENOUS
  Filled 2024-01-25: qty 10

## 2024-01-25 MED ORDER — FUROSEMIDE 10 MG/ML IJ SOLN
40.0000 mg | Freq: Once | INTRAMUSCULAR | Status: AC
  Administered 2024-01-25: 40 mg via INTRAVENOUS
  Filled 2024-01-25: qty 4

## 2024-01-25 MED ORDER — ONDANSETRON HCL 4 MG/2ML IJ SOLN
4.0000 mg | Freq: Once | INTRAMUSCULAR | Status: AC
  Administered 2024-01-25: 4 mg via INTRAVENOUS

## 2024-01-25 MED ORDER — TRAMADOL HCL 50 MG PO TABS
50.0000 mg | ORAL_TABLET | Freq: Once | ORAL | Status: DC
  Filled 2024-01-25: qty 1

## 2024-01-25 MED ORDER — TRAMADOL HCL 50 MG PO TABS: 50.0000 mg | ORAL_TABLET | Freq: Four times a day (QID) | ORAL | 0 refills | Status: DC | PRN

## 2024-01-25 MED ORDER — ONDANSETRON HCL 4 MG/2ML IJ SOLN
INTRAMUSCULAR | Status: DC
Start: 2024-01-25 — End: 2024-01-25
  Filled 2024-01-25: qty 2

## 2024-01-25 MED ORDER — CYCLOBENZAPRINE HCL 10 MG PO TABS
5.0000 mg | ORAL_TABLET | Freq: Once | ORAL | Status: AC
  Administered 2024-01-25: 5 mg via ORAL
  Filled 2024-01-25: qty 1

## 2024-01-25 MED ORDER — CEPHALEXIN 500 MG PO CAPS: 500.0000 mg | ORAL_CAPSULE | Freq: Three times a day (TID) | ORAL | 0 refills | Status: DC

## 2024-01-25 MED ORDER — HYDROMORPHONE HCL 1 MG/ML IJ SOLN
1.0000 mg | Freq: Once | INTRAMUSCULAR | Status: AC
  Administered 2024-01-25: 1 mg via INTRAMUSCULAR
  Filled 2024-01-25: qty 1

## 2024-01-25 MED ORDER — ACETAMINOPHEN 500 MG PO TABS
1000.0000 mg | ORAL_TABLET | Freq: Once | ORAL | Status: AC
  Administered 2024-01-25: 1000 mg via ORAL
  Filled 2024-01-25: qty 2

## 2024-01-25 NOTE — ED Notes (Signed)
 Patient transported to CT

## 2024-01-25 NOTE — Discharge Instructions (Addendum)
 The CAT scan and x-rays today looked good.  Your urine showed concern for a urinary tract infection and your white blood cell count was mildly elevated related to this.  This could also be 1 reason why you are having more back pain but some of it may also be related to arthritis.  You can take 2 extra strength Tylenol  every 6 hours as well as the tramadol  as needed for pain.  If you start having worsening symptoms return to the emergency room.  You do not have to take your next dose of antibiotic till tomorrow morning.

## 2024-01-25 NOTE — ED Notes (Signed)
 MD advised to hold lasix  injection until CMP results come back from lab.

## 2024-01-25 NOTE — ED Provider Notes (Signed)
 Assumed care from Dr. Bari.  I have independently visualized and interpreted pt's images today.  CT without evidence of renal obstructing stones or hydronephrosis.  No signs of pyelonephritis.  Radiology reports no obstructing stone small hiatal hernia and otherwise negative CT.  Findings discussed with the patient.  At that time appears stated patient is stable for discharge.  She has already received Rocephin  here and will be sent home with Keflex .  Also given short course of pain control.  She cannot take anti-inflammatories due to being on Eliquis .    Doretha Folks, MD 01/25/24 [phone]

## 2024-01-25 NOTE — ED Notes (Signed)
[facility] called, 2nd in line ?

## 2024-01-25 NOTE — ED Provider Notes (Signed)
 [facility] EMERGENCY DEPARTMENT AT [facility] Provider Note   CSN: [id number] Arrival date & time: 01/24/24  1725     Patient presents with: Cough and Back Pain   [name] is a 53 y.o. female.   HPI    This is a 53 year old female who presents with lower back pain and shortness of breath.  Patient reports that she has had back pain for approximately 1 week.  Initially it was on 1 side of the back but then also came across the back.  No urinary symptoms.  Denies heavy lifting or injury.  Patient also reports increasing shortness of breath.  She is not had any chest discomfort but has a history of CHF and has noted some lower extremity swelling.  She takes torsemide  daily and states that she has been compliant with her medications.  No recent fever.  Does endorse cough. Prior to Admission medications   Medication Sig Start Date End Date Taking? Authorizing Provider  ADVAIR HFA 230-21 MCG/ACT inhaler Inhale 2 puffs into the lungs 2 (two) times daily. 12/20/20   [provider]  albuterol  (PROVENTIL ) (2.5 MG/3ML) 0.083% nebulizer solution Take 2.5 mg by nebulization as needed for wheezing.    [provider]  ALPRAZolam  (XANAX ) 0.5 MG tablet Take 0.5 mg by mouth as needed for anxiety or sleep. 04/19/17   [provider]  apixaban  (ELIQUIS ) 2.5 MG TABS tablet Take 1 tablet (2.5 mg total) by mouth 2 (two) times daily. 11/29/23   Ladona Heinz, MD  ARIPiprazole  (ABILIFY ) 5 MG tablet Take 5 mg by mouth daily. 01/14/21   [provider]  Azathioprine  75 MG TABS Take 1 tablet by mouth 2 (two) times daily. 01/19/23   [provider]  B-D UF III MINI PEN NEEDLES 31G X 5 MM MISC USE AS DIRECTED TWICE A DAY 10/31/18   Kassie Mallick, MD  buPROPion  (WELLBUTRIN  XL) 300 MG 24 hr tablet Take 300 mg by mouth daily. 03/14/22   [provider]  carvedilol  (COREG ) 25 MG tablet Take 1 tablet (25 mg total) by mouth 2 (two) times daily with a meal. 12/22/23    Ladona Heinz, MD  Continuous Blood Gluc Sensor (FREESTYLE LIBRE 3 SENSOR) MISC 1 Device by Does not apply route every 14 (fourteen) days. Apply 1 sensor on upper arm every 14 days for continuous glucose monitoring 05/25/22   Von Pacific, MD  cromolyn  (OPTICROM ) 4 % ophthalmic solution Place 1 drop into both eyes 2 (two) times daily. 01/14/21   [provider]  cycloSPORINE  (RESTASIS ) 0.05 % ophthalmic emulsion Place 1 drop into both eyes in the morning, at noon, and at bedtime.     [provider]  diclofenac  Sodium (VOLTAREN ) 1 % GEL APPLY 2 GRAMS TO AFFECTED AREA 4 TIMES A DAY Patient taking differently: Apply 2 g topically 4 (four) times daily as needed (pain). 01/11/22   Cantwell, Celeste C, PA-C  diphenhydrAMINE  (BENADRYL ) 50 MG tablet Take 0.5 tablets (25 mg total) by mouth every 6 (six) hours as needed for itching. 05/01/23   Verdene Purchase, MD  EASY TOUCH PEN NEEDLES 32G X 4 MM MISC USE TO INJECT INSULIN  TWICE DAILY 08/07/19   Kassie Mallick, MD  empagliflozin  (JARDIANCE ) 10 MG TABS tablet Take 1 tablet by mouth daily. 03/03/22   [provider]  famotidine  (PEPCID ) 20 MG tablet Take 20 mg by mouth 2 (two) times daily.    [provider]  fluticasone  (FLONASE ) 50 MCG/ACT nasal spray  Place 2 sprays into both nostrils continuous as needed for allergies or rhinitis.    [provider]  glucose blood (ACCU-CHEK GUIDE) test strip Use as instructed 02/22/23   Von Pacific, MD  glucose blood (ONETOUCH ULTRA) test strip Test blood sugar 4 times a day 03/30/22   Von Pacific, MD  hydroxychloroquine  (PLAQUENIL ) 200 MG tablet Take 200 mg by mouth 2 (two) times daily.    [provider]  ibuprofen  (ADVIL ) 600 MG tablet Take 1 tablet (600 mg total) by mouth every 6 (six) hours as needed. Patient taking differently: Take 600 mg by mouth every 6 (six) hours as needed for mild pain (pain score 1-3). 10/14/22   Vicky Charleston, PA-C  insulin  regular human  CONCENTRATED (HUMULIN  R U-500 KWIKPEN) 500 UNIT/ML KwikPen 100 UNITS, 30 MINUTES BEFORE [facility] MEAL 11/17/22   Von Pacific, MD  Insulin  Syringe-Needle U-100 (EASY TOUCH INSULIN  SYRINGE) 31G X 5/16 1 ML MISC USE AS DIRECTED THREE TIMES A DAY 10/12/18   Kassie Mallick, MD  isosorbide -hydrALAZINE  (BIDIL ) 20-37.5 MG tablet Take 1 tablet by mouth 3 (three) times daily.    [provider]  Lancets [facility] ULTRASOFT) lancets Test blood sugar 4 times a day 03/30/22   Von Pacific, MD  levothyroxine  (SYNTHROID ) 50 MCG tablet Take 1 tablet (50 mcg total) by mouth daily at 6 (six) AM. 08/30/19   Sherrill Cable Latif, DO  meclizine  (ANTIVERT ) 12.5 MG tablet Take 1 tablet (12.5 mg total) by mouth 3 (three) times daily as needed for dizziness. 05/03/23   Regalado, Belkys A, MD  megestrol  (MEGACE ) 40 MG tablet Take 40 mg by mouth daily. 01/30/15   [provider]  metFORMIN  (GLUCOPHAGE -XR) 500 MG 24 hr tablet Take 2 tablets (1,000 mg total) by mouth 2 (two) times daily. 02/02/21   Kassie Mallick, MD  metoCLOPramide  (REGLAN ) 10 MG tablet Take 1 tablet (10 mg total) by mouth 3 (three) times daily before meals. 05/01/23   Krishnan, Gokul, MD  Multiple Vitamin (MULTIVITAMIN WITH MINERALS) TABS tablet Take 1 tablet by mouth daily. 08/30/19   Sherrill Cable Latif, DO  ondansetron  (ZOFRAN -ODT) 4 MG disintegrating tablet Take 1 tablet (4 mg total) by mouth every 8 (eight) hours as needed for nausea or vomiting. 10/14/22   Vicky Charleston, PA-C  oxybutynin  (DITROPAN  XL) 15 MG 24 hr tablet Take 15 mg by mouth daily.    [provider]  pantoprazole  (PROTONIX ) 40 MG tablet Take 1 tablet (40 mg total) by mouth daily at 12 noon. 05/04/23   Regalado, Belkys A, MD  pregabalin  (LYRICA ) 75 MG capsule Take 75 mg 3 (three) times daily by mouth.  01/28/15   [provider]  QUEtiapine  (SEROQUEL ) 300 MG tablet Take 300 mg by mouth at bedtime. 01/08/21   [provider]  rosuvastatin  (CRESTOR ) 20 MG tablet Take  1 tablet (20 mg total) by mouth daily. 06/09/21 10/16/22  Cantwell, Celeste C, PA-C  sacubitril -valsartan  (ENTRESTO ) 97-103 MG TAKE 1 TABLET BY MOUTH TWICE A DAY 08/17/23   Ladona Heinz, MD  spironolactone  (ALDACTONE ) 50 MG tablet Take 1 tablet (50 mg total) by mouth daily. 12/22/23   Ladona Heinz, MD  sucralfate  (CARAFATE ) 1 GM/10ML suspension Take 10 mLs by mouth 2 (two) times daily as needed (abdomen pain). 12/09/20   [provider]  tiZANidine (ZANAFLEX) 4 MG capsule Take 4 mg by mouth 3 (three) times daily.    [provider]  topiramate  (TOPAMAX ) 25 MG tablet Take 75 mg by mouth at bedtime.  03/17/16   [provider]  torsemide  (DEMADEX ) 20 MG tablet Take 1 tablet (20 mg total) by mouth at bedtime. 03/06/23   Ladona Heinz, MD  traMADol  (ULTRAM ) 50 MG tablet Take 1 tablet (50 mg total) by mouth every 6 (six) hours as needed. 05/01/23   Krishnan, Gokul, MD  TRANSDERM-SCOP 1 MG/3DAYS Place 1 patch (1.5 mg total) onto the skin every 3 (three) days. As needed for nausea 10/21/22   Clark, Meghan R, PA-C  Vitamin D, Ergocalciferol, (DRISDOL) 1.25 MG (50000 UNIT) CAPS capsule Take 50,000 Units by mouth once a week. 09/20/22   [provider]    Allergies: Metoclopramide , Codeine, Hydrocodone , and Percocet [oxycodone -acetaminophen ]    Review of Systems  Constitutional:  Negative for fever.  Respiratory:  Positive for cough and shortness of breath.   Cardiovascular:  Negative for chest pain.  Gastrointestinal:  Negative for abdominal pain.  Musculoskeletal:  Positive for back pain.  All other systems reviewed and are negative.   Updated Vital Signs BP 134/77   Pulse 78   Temp 98.4 F (36.9 C) (Oral)   Resp 20   Ht 1.727 m (5' 8)   Wt (!) 141.1 kg   LMP 03/24/2017   SpO2 100%   BMI 47.29 kg/m   Physical Exam Vitals and nursing note reviewed.  Constitutional:      Appearance: She is well-developed. She is obese. She is not ill-appearing.  HENT:     Head:  Normocephalic and atraumatic.   Eyes:     Pupils: Pupils are equal, round, and reactive to light.    Cardiovascular:     Rate and Rhythm: Normal rate and regular rhythm.     Heart sounds: Normal heart sounds.  Pulmonary:     Effort: Pulmonary effort is normal. No respiratory distress.     Breath sounds: No wheezing.     Comments: No respiratory distress, distant breath sounds Abdominal:     General: Bowel sounds are normal.     Palpations: Abdomen is soft.   Musculoskeletal:     Cervical back: Neck supple.     Comments: No midline tenderness to palpation, no CVA tenderness   Skin:    General: Skin is warm and dry.   Neurological:     Mental Status: She is alert and oriented to person, place, and time.   Psychiatric:        Mood and Affect: Mood normal.     (all labs ordered are listed, but only abnormal results are displayed) Labs Reviewed  CBC WITH DIFFERENTIAL/PLATELET - Abnormal; Notable for the following components:      Result Value   WBC 12.2 (*)    RBC 3.73 (*)    Hemoglobin 10.8 (*)    HCT 35.9 (*)    Neutro Abs 8.6 (*)    Eosinophils Absolute 0.7 (*)    All other components within normal limits  COMPREHENSIVE METABOLIC PANEL WITH GFR - Abnormal; Notable for the following components:   Potassium 5.5 (*)    Chloride 117 (*)    CO2 18 (*)    Glucose, Bld 199 (*)    BUN 24 (*)    Creatinine, Ser 1.57 (*)    AST 13 (*)    GFR, Estimated 39 (*)    All other components within normal limits  URINALYSIS, ROUTINE W REFLEX MICROSCOPIC - Abnormal; Notable for the following components:   Nitrite POSITIVE (*)    Leukocytes,Ua TRACE (*)    Bacteria, UA  MANY (*)    All other components within normal limits  URINE CULTURE  BRAIN NATRIURETIC PEPTIDE    EKG: EKG Interpretation Date/Time:  Thursday January 25 2024 04:42:05 EDT Ventricular Rate:  91 PR Interval:  163 QRS Duration:  92 QT Interval:  349 QTC Calculation: 430 R Axis:   47  Text  Interpretation: Sinus rhythm Low voltage, precordial leads Confirmed by Bari Pfeiffer ([id number]) on 01/25/2024 5:46:11 AM  Radiology: [facility] Lumbar Spine Complete Result Date: 01/24/2024 CLINICAL DATA:  Left-sided low back pain, no known injury, initial encounter EXAM: LUMBAR SPINE - COMPLETE 4+ VIEW COMPARISON:  None Available. FINDINGS: Five lumbar type vertebral bodies are well visualized. Vertebral body height is well maintained. Minimal osteophytic changes are seen. Minimal degenerative anterolisthesis is noted at L5-S1. No pars defects are seen. IMPRESSION: Minimal degenerative change as described. Electronically Signed   By: Oneil Devonshire M.D.   On: 01/24/2024 19:54   DG Chest 2 View Result Date: 01/24/2024 CLINICAL DATA:  Left-sided low back pain EXAM: CHEST - 2 VIEW COMPARISON:  10/15/2022 FINDINGS: Heart is enlarged in size. Mild vascular congestion is noted without edema. No focal infiltrate or effusion is seen. No bony abnormality is noted. IMPRESSION: Mild vascular congestion without edema. Electronically Signed   By: Oneil Devonshire M.D.   On: 01/24/2024 19:53     Procedures   Medications Ordered in the ED  traMADol  (ULTRAM ) tablet 50 mg (50 mg Oral Not Given 01/25/24 0550)  ondansetron  (ZOFRAN ) 4 MG/2ML injection (  Not Given 01/25/24 0625)  cefTRIAXone  (ROCEPHIN ) 1 g in sodium chloride  0.9 % 100 mL IVPB (has no administration in time range)  cyclobenzaprine  (FLEXERIL ) tablet 5 mg (has no administration in time range)  furosemide  (LASIX ) injection 40 mg (40 mg Intravenous Given 01/25/24 0548)  acetaminophen  (TYLENOL ) tablet 1,000 mg (1,000 mg Oral Given 01/25/24 0430)  ondansetron  (ZOFRAN ) injection 4 mg (4 mg Intravenous Given 01/25/24 0623)    Clinical Course as of 01/25/24 0653  Thu Jan 25, 2024  0652 Patient refusing tramadol .  Requesting something stronger.  Discussed with her that did not feel that was indicated at this time.  Will obtain CT imaging to ensure no complication from UTI.   If she takes tramadol  and that does not work, we can discuss escalating pain control.  Otherwise limited because of allergies. [CH]    Clinical Course User Index [CH] Chloee Tena, Pfeiffer FALCON, MD                                 Medical Decision Making Amount and/or Complexity of Data Reviewed Labs: ordered. Radiology: ordered.  Risk OTC drugs. Prescription drug management.   This patient presents to the ED for concern of back pain, shortness of breath, this involves an extensive number of treatment options, and is a complaint that carries with it a high risk of complications and morbidity.  I considered the following differential and admission for this acute, potentially life threatening condition.  The differential diagnosis includes muscle strain, muscle spasm, pyelonephritis, kidney stone, less likely fracture, regarding shortness of breath, CHF, pneumothorax, pneumonia  MDM:    This is a 53 year old female who presents with back pain and shortness of breath.  She is nontoxic.  Vital signs notable for blood pressure 141/74.  She is not hypoxic or notably in any respiratory distress.  X-rays reviewed from triage including chest and lumbar films which are negative.  Basic lab work is largely reassuring.  Urinalysis is likely indicative of UTI.  Back pain could represent early pyelonephritis although she is not systemically ill or febrile.  IV Rocephin  given.  Urine culture pending.  Will obtain CT stone study to ensure no obstructing stone.  Patient refused multiple attempts at pain management.  Her kidney function and Eliquis  makes giving anti-inflammatories more difficult; however she has multiple allergies as well.  I requested that she trial tramadol  as I did not think IV narcotics are appropriate at this time.  Will escalate pain control if not improved.  Patient signed out pending CT scan  (Labs, imaging, consults)  Labs: I Ordered, and personally interpreted labs.  The pertinent results  include: CBC, CMP, urinalysis  Imaging Studies ordered: I ordered imaging studies including CT stone study I independently visualized and interpreted imaging. I agree with the radiologist interpretation  Additional history obtained from chart review.  External records from outside source obtained and reviewed including prior evaluations  Cardiac Monitoring: The patient was maintained on a cardiac monitor.  If on the cardiac monitor, I personally viewed and interpreted the cardiac monitored which showed an underlying rhythm of: Sinus  Reevaluation: After the interventions noted above, I reevaluated the patient and found that they have :stayed the same  Social Determinants of Health:  lives independently  Disposition: Pending CT  Co morbidities that complicate the patient evaluation  Past Medical History:  Diagnosis Date   Acute renal failure (HCC)     Intractable nausea vomiting secondary to diabetic gastroparesis causing dehydration and acute renal failure /notes 04/01/2013   Anginal pain (HCC)    Anxiety    Bell's palsy 08/09/2010   Bicornuate uterus    CAP (community acquired pneumonia) 10/2011   thelbert 11/08/2011   Chest pain 01/13/2014   CHF (congestive heart failure) (HCC)    Diabetic gastroparesis associated with type 2 diabetes mellitus (HCC)    this is presumed diagnoses, not confirmed by any studies.    Eczema    Family history of breast cancer    Family history of malignant neoplasm of breast    Family history of malignant neoplasm of ovary    Family history of ovarian cancer    Family history of prostate cancer    GERD (gastroesophageal reflux disease)    High cholesterol    Hypertension    Liver lesion 08/13/2019   Liver mass 2014   biopsied 03/2013 at [facility], not malignant.  is to undergo radiologic ablation of the mass in sept/October 2014.    Lupus    Migraines    maybe a couple times/yr (04/01/2013)   Obesity    Obstructive sleep apnea on CPAP  2011   Oxygen at nights   Pulmonary embolism (HCC) 05/21/2021   SJOGREN'S SYNDROME 08/09/2010   SLE (systemic lupus erythematosus) (HCC)    thelbert 12/01/2010     Medicines Meds ordered this encounter  Medications   furosemide  (LASIX ) injection 40 mg   acetaminophen  (TYLENOL ) tablet 1,000 mg   DISCONTD: traMADol  (ULTRAM ) tablet 50 mg   ondansetron  (ZOFRAN ) injection 4 mg   DISCONTD: ondansetron  (ZOFRAN ) 4 MG/2ML injection    Claudene Lot E: cabinet override   cefTRIAXone  (ROCEPHIN ) 1 g in sodium chloride  0.9 % 100 mL IVPB    Antibiotic Indication::   UTI   cyclobenzaprine  (FLEXERIL ) tablet 5 mg   cephALEXin  (KEFLEX ) 500 MG capsule    Sig: Take 1 capsule (500 mg total) by mouth 3 (three)  times daily.    Dispense:  21 capsule    Refill:  0   HYDROmorphone  (DILAUDID ) injection 1 mg   traMADol  (ULTRAM ) 50 MG tablet    Sig: Take 1 tablet (50 mg total) by mouth every 6 (six) hours as needed.    Dispense:  15 tablet    Refill:  0    I have reviewed the patients home medicines and have made adjustments as needed  Problem List / ED Course: Problem List Items Addressed This Visit   None Visit Diagnoses       Acute on chronic congestive heart failure, unspecified heart failure type (HCC)    -  Primary   Relevant Medications   furosemide  (LASIX ) injection 40 mg (Completed)     Acute cystitis without hematuria         Acute bilateral low back pain without sciatica       Relevant Medications   acetaminophen  (TYLENOL ) tablet 1,000 mg (Completed)   cyclobenzaprine  (FLEXERIL ) tablet 5 mg (Completed)   HYDROmorphone  (DILAUDID ) injection 1 mg (Completed)   traMADol  (ULTRAM ) 50 MG tablet                Final diagnoses:  None    ED Discharge Orders     None          Bari Charmaine FALCON, MD 01/26/24 [phone]

## 2024-01-27 ENCOUNTER — Other Ambulatory Visit: Payer: Self-pay

## 2024-01-27 ENCOUNTER — Emergency Department (HOSPITAL_COMMUNITY)
Admission: EM | Admit: 2024-01-27 | Discharge: 2024-01-28 | Disposition: A | Discharge status: Home / Self Care | Attending: Emergency Medicine | Admitting: Emergency Medicine

## 2024-01-27 ENCOUNTER — Encounter (HOSPITAL_COMMUNITY): Payer: Self-pay | Admitting: *Deleted

## 2024-01-27 DIAGNOSIS — Z79899 Other long term (current) drug therapy: Secondary | ICD-10-CM | POA: Diagnosis not present

## 2024-01-27 DIAGNOSIS — M545 Low back pain, unspecified: Secondary | ICD-10-CM | POA: Insufficient documentation

## 2024-01-27 DIAGNOSIS — Z794 Long term (current) use of insulin: Secondary | ICD-10-CM | POA: Insufficient documentation

## 2024-01-27 DIAGNOSIS — Z7901 Long term (current) use of anticoagulants: Secondary | ICD-10-CM | POA: Diagnosis not present

## 2024-01-27 DIAGNOSIS — I130 Hypertensive heart and chronic kidney disease with heart failure and stage 1 through stage 4 chronic kidney disease, or unspecified chronic kidney disease: Secondary | ICD-10-CM | POA: Diagnosis not present

## 2024-01-27 DIAGNOSIS — N189 Chronic kidney disease, unspecified: Secondary | ICD-10-CM | POA: Insufficient documentation

## 2024-01-27 DIAGNOSIS — R112 Nausea with vomiting, unspecified: Secondary | ICD-10-CM | POA: Diagnosis not present

## 2024-01-27 DIAGNOSIS — R1084 Generalized abdominal pain: Secondary | ICD-10-CM | POA: Insufficient documentation

## 2024-01-27 DIAGNOSIS — I509 Heart failure, unspecified: Secondary | ICD-10-CM | POA: Diagnosis not present

## 2024-01-27 LAB — CBC WITH DIFFERENTIAL/PLATELET
Abs Immature Granulocytes: 0.03 10*3/uL (ref 0.00–0.07)
Basophils Absolute: 0.1 10*3/uL (ref 0.0–0.1)
Basophils Relative: 1 %
Eosinophils Absolute: 0.1 10*3/uL (ref 0.0–0.5)
Eosinophils Relative: 2 %
HCT: 36.1 % (ref 36.0–46.0)
Hemoglobin: 11.4 g/dL — ABNORMAL LOW (ref 12.0–15.0)
Immature Granulocytes: 0 %
Lymphocytes Relative: 25 %
Lymphs Abs: 2.3 10*3/uL (ref 0.7–4.0)
MCH: 28.9 pg (ref 26.0–34.0)
MCHC: 31.6 g/dL (ref 30.0–36.0)
MCV: 91.4 fL (ref 80.0–100.0)
Monocytes Absolute: 0.8 10*3/uL (ref 0.1–1.0)
Monocytes Relative: 9 %
Neutro Abs: 5.9 10*3/uL (ref 1.7–7.7)
Neutrophils Relative %: 63 %
Platelets: 211 10*3/uL (ref 150–400)
RBC: 3.95 MIL/uL (ref 3.87–5.11)
RDW: 14 % (ref 11.5–15.5)
WBC: 9.3 10*3/uL (ref 4.0–10.5)
nRBC: 0 % (ref 0.0–0.2)

## 2024-01-27 LAB — COMPREHENSIVE METABOLIC PANEL WITH GFR
ALT: 10 U/L (ref 0–44)
AST: 19 U/L (ref 15–41)
Albumin: 3.6 g/dL (ref 3.5–5.0)
Alkaline Phosphatase: 69 U/L (ref 38–126)
Anion gap: 10 (ref 5–15)
BUN: 21 mg/dL — ABNORMAL HIGH (ref 6–20)
CO2: 18 mmol/L — ABNORMAL LOW (ref 22–32)
Calcium: 9.2 mg/dL (ref 8.9–10.3)
Chloride: 110 mmol/L (ref 98–111)
Creatinine, Ser: 1.3 mg/dL — ABNORMAL HIGH (ref 0.44–1.00)
GFR, Estimated: 49 mL/min — ABNORMAL LOW (ref >60)
Glucose, Bld: 179 mg/dL — ABNORMAL HIGH (ref 70–99)
Potassium: 3.9 mmol/L (ref 3.5–5.1)
Sodium: 138 mmol/L (ref 135–145)
Total Bilirubin: 0.8 mg/dL (ref 0.0–1.2)
Total Protein: 7.3 g/dL (ref 6.5–8.1)

## 2024-01-27 LAB — URINALYSIS, ROUTINE W REFLEX MICROSCOPIC
Bacteria, UA: NONE SEEN
Bilirubin Urine: NEGATIVE
Glucose, UA: NEGATIVE mg/dL
Hgb urine dipstick: NEGATIVE
Ketones, ur: NEGATIVE mg/dL
Leukocytes,Ua: NEGATIVE
Nitrite: NEGATIVE
Protein, ur: 100 mg/dL — AB
Specific Gravity, Urine: 1.033 — ABNORMAL HIGH (ref 1.005–1.030)
pH: 5 (ref 5.0–8.0)

## 2024-01-27 LAB — LIPASE, BLOOD: Lipase: 35 U/L (ref 11–51)

## 2024-01-27 NOTE — ED Triage Notes (Signed)
 The pt was here 2-3 days ago for the same conplaint she has now  pain in her abd and back

## 2024-01-27 NOTE — ED Notes (Signed)
 Pt stating she feels like she is going to vomit and pass out at the same time. PA aware. Pt now laying in triage chair.

## 2024-01-27 NOTE — ED Provider Triage Note (Signed)
 Emergency Medicine Provider Triage Evaluation Note  CAELEIGH PROHASKA , a 53 y.o. female  was evaluated in triage.  Pt complains of abdominal pain, nausea, vomiting. Was seen for this a few days ago, diagnosed with a UTI. Notes that she she has been trying to take her antibiotics, however she feels like every time she takes them she vomits.  She has not been able to tolerate any of her medication due to nausea and vomiting.  Endorses worsening pain in her abdomen from when she was seen before.  Review of Systems  Positive:  Negative:   Physical Exam  BP (!) 163/110 (BP Location: Right Wrist)   Pulse 89   Temp 98.3 F (36.8 C)   Resp (!) 22   Ht 5' 8 (1.727 m)   Wt (!) 141.1 kg   LMP 03/24/2017   SpO2 97%   BMI 47.30 kg/m  Gen:   Awake, no distress   Resp:  Normal effort  MSK:   Moves extremities without difficulty  Other:    Medical Decision Making  Medically screening exam initiated at 8:15 PM.  Appropriate orders placed.  ALLANNAH KEMPEN was informed that the remainder of the evaluation will be completed by another provider, this initial triage assessment does not replace that evaluation, and the importance of remaining in the ED until their evaluation is complete.     Nora Lauraine LABOR, DEVONNA 01/27/24 2016

## 2024-01-27 NOTE — ED Notes (Signed)
 Pt family Associate Professor repeatedly as per Diplomatic Services operational officer

## 2024-01-28 MED ORDER — DIPHENHYDRAMINE HCL 25 MG PO CAPS
25.0000 mg | ORAL_CAPSULE | Freq: Once | ORAL | Status: AC
  Administered 2024-01-28: 25 mg via ORAL
  Filled 2024-01-28: qty 1

## 2024-01-28 MED ORDER — METOCLOPRAMIDE HCL 10 MG PO TABS
10.0000 mg | ORAL_TABLET | Freq: Once | ORAL | Status: AC
  Administered 2024-01-28: 10 mg via ORAL
  Filled 2024-01-28: qty 1

## 2024-01-28 MED ORDER — HYDROMORPHONE HCL 1 MG/ML IJ SOLN
0.5000 mg | Freq: Once | INTRAMUSCULAR | Status: AC
  Administered 2024-01-28: 0.5 mg via INTRAMUSCULAR
  Filled 2024-01-28: qty 1

## 2024-01-28 NOTE — ED Provider Notes (Signed)
 [facility] Provider Note   CSN: [id number] Arrival date & time: 01/27/24  1842     Patient presents with: Shortness of Breath, Abdominal Pain, and Back Pain   [name] is a 53 y.o. female.   54 year female presents with complaint of ongoing pain in her back. Patient states this started about a week or so ago, came to the ER 3 days ago for this and was diagnosed with a UTI, provided with abx and dc with abx. Since starting the abx, she has developed generalized abdominal pain with nausea and vomiting. No changes in bowel or bladder habits. Pain in lower back occasionally radiates to right thigh area. No reports of falls, fevers, loss of bowel or bladder habits, saddle paresthesia. States the only medication that helps with her nausea is reglan  but she has to take this with benadryl . The only pain med that helps her is diluadid.        Prior to Admission medications   Medication Sig Start Date End Date Taking? Authorizing Provider  ADVAIR HFA 230-21 MCG/ACT inhaler Inhale 2 puffs into the lungs 2 (two) times daily. 12/20/20   [provider]  albuterol  (PROVENTIL ) (2.5 MG/3ML) 0.083% nebulizer solution Take 2.5 mg by nebulization as needed for wheezing.    [provider]  ALPRAZolam  (XANAX ) 0.5 MG tablet Take 0.5 mg by mouth as needed for anxiety or sleep. 04/19/17   [provider]  apixaban  (ELIQUIS ) 2.5 MG TABS tablet Take 1 tablet (2.5 mg total) by mouth 2 (two) times daily. 11/29/23   Ladona Heinz, MD  ARIPiprazole  (ABILIFY ) 5 MG tablet Take 5 mg by mouth daily. 01/14/21   [provider]  Azathioprine  75 MG TABS Take 1 tablet by mouth 2 (two) times daily. 01/19/23   [provider]  B-D UF III MINI PEN NEEDLES 31G X 5 MM MISC USE AS DIRECTED TWICE A DAY 10/31/18   Kassie Mallick, MD  buPROPion  (WELLBUTRIN  XL) 300 MG 24 hr tablet Take 300 mg by mouth daily. 03/14/22   [provider]   carvedilol  (COREG ) 25 MG tablet Take 1 tablet (25 mg total) by mouth 2 (two) times daily with a meal. 12/22/23   Ladona Heinz, MD  cephALEXin  (KEFLEX ) 500 MG capsule Take 1 capsule (500 mg total) by mouth 3 (three) times daily. 01/25/24   Horton, Charmaine FALCON, MD  Continuous Blood Gluc Sensor (FREESTYLE LIBRE 3 SENSOR) MISC 1 Device by Does not apply route every 14 (fourteen) days. Apply 1 sensor on upper arm every 14 days for continuous glucose monitoring 05/25/22   Von Pacific, MD  cromolyn  (OPTICROM ) 4 % ophthalmic solution Place 1 drop into both eyes 2 (two) times daily. 01/14/21   [provider]  cycloSPORINE  (RESTASIS ) 0.05 % ophthalmic emulsion Place 1 drop into both eyes in the morning, at noon, and at bedtime.     [provider]  diclofenac  Sodium (VOLTAREN ) 1 % GEL APPLY 2 GRAMS TO AFFECTED AREA 4 TIMES A DAY Patient taking differently: Apply 2 g topically 4 (four) times daily as needed (pain). 01/11/22   Cantwell, Celeste C, PA-C  diphenhydrAMINE  (BENADRYL ) 50 MG tablet Take 0.5 tablets (25 mg total) by mouth every 6 (six) hours as needed for itching. 05/01/23   Verdene Purchase, MD  EASY TOUCH PEN NEEDLES 32G X 4 MM MISC USE TO INJECT INSULIN  TWICE DAILY 08/07/19   Kassie Mallick, MD  empagliflozin  (JARDIANCE ) 10 MG TABS  tablet Take 1 tablet by mouth daily. 03/03/22   [provider]  famotidine  (PEPCID ) 20 MG tablet Take 20 mg by mouth 2 (two) times daily.    [provider]  fluticasone  (FLONASE ) 50 MCG/ACT nasal spray Place 2 sprays into both nostrils continuous as needed for allergies or rhinitis.    [provider]  glucose blood (ACCU-CHEK GUIDE) test strip Use as instructed 02/22/23   Von Pacific, MD  glucose blood (ONETOUCH ULTRA) test strip Test blood sugar 4 times a day 03/30/22   Von Pacific, MD  hydroxychloroquine  (PLAQUENIL ) 200 MG tablet Take 200 mg by mouth 2 (two) times daily.    [provider]  ibuprofen  (ADVIL ) 600 MG tablet Take  1 tablet (600 mg total) by mouth every 6 (six) hours as needed. Patient taking differently: Take 600 mg by mouth every 6 (six) hours as needed for mild pain (pain score 1-3). 10/14/22   Vicky Charleston, PA-C  insulin  regular human CONCENTRATED (HUMULIN  R U-500 KWIKPEN) 500 UNIT/ML KwikPen 100 UNITS, 30 MINUTES BEFORE [facility] MEAL 11/17/22   Von Pacific, MD  Insulin  Syringe-Needle U-100 (EASY TOUCH INSULIN  SYRINGE) 31G X 5/16 1 ML MISC USE AS DIRECTED THREE TIMES A DAY 10/12/18   Kassie Mallick, MD  isosorbide -hydrALAZINE  (BIDIL ) 20-37.5 MG tablet Take 1 tablet by mouth 3 (three) times daily.    [provider]  Lancets [facility] 352 ULTRASOFT) lancets Test blood sugar 4 times a day 03/30/22   Von Pacific, MD  levothyroxine  (SYNTHROID ) 50 MCG tablet Take 1 tablet (50 mcg total) by mouth daily at 6 (six) AM. 08/30/19   Sherrill Cable Latif, DO  meclizine  (ANTIVERT ) 12.5 MG tablet Take 1 tablet (12.5 mg total) by mouth 3 (three) times daily as needed for dizziness. 05/03/23   Regalado, Belkys A, MD  megestrol  (MEGACE ) 40 MG tablet Take 40 mg by mouth daily. 01/30/15   [provider]  metFORMIN  (GLUCOPHAGE -XR) 500 MG 24 hr tablet Take 2 tablets (1,000 mg total) by mouth 2 (two) times daily. 02/02/21   Kassie Mallick, MD  metoCLOPramide  (REGLAN ) 10 MG tablet Take 1 tablet (10 mg total) by mouth 3 (three) times daily before meals. 05/01/23   Krishnan, Gokul, MD  Multiple Vitamin (MULTIVITAMIN WITH MINERALS) TABS tablet Take 1 tablet by mouth daily. 08/30/19   Sherrill Cable Latif, DO  ondansetron  (ZOFRAN -ODT) 4 MG disintegrating tablet Take 1 tablet (4 mg total) by mouth every 8 (eight) hours as needed for nausea or vomiting. 10/14/22   Vicky Charleston, PA-C  oxybutynin  (DITROPAN  XL) 15 MG 24 hr tablet Take 15 mg by mouth daily.    [provider]  pantoprazole  (PROTONIX ) 40 MG tablet Take 1 tablet (40 mg total) by mouth daily at 12 noon. 05/04/23   Regalado, Owen A, MD  pregabalin  (LYRICA ) 75 MG  capsule Take 75 mg 3 (three) times daily by mouth.  01/28/15   [provider]  QUEtiapine  (SEROQUEL ) 300 MG tablet Take 300 mg by mouth at bedtime. 01/08/21   [provider]  rosuvastatin  (CRESTOR ) 20 MG tablet Take 1 tablet (20 mg total) by mouth daily. 06/09/21 10/16/22  Cantwell, Celeste C, PA-C  sacubitril -valsartan  (ENTRESTO ) 97-103 MG TAKE 1 TABLET BY MOUTH TWICE A DAY 08/17/23   Ladona Heinz, MD  spironolactone  (ALDACTONE ) 50 MG tablet Take 1 tablet (50 mg total) by mouth daily. 12/22/23   Ladona Heinz, MD  sucralfate  (CARAFATE ) 1 GM/10ML suspension Take 10 mLs by mouth 2 (two) times daily as needed (abdomen  pain). 12/09/20   [provider]  tiZANidine (ZANAFLEX) 4 MG capsule Take 4 mg by mouth 3 (three) times daily.    [provider]  topiramate  (TOPAMAX ) 25 MG tablet Take 75 mg by mouth at bedtime. 03/17/16   [provider]  torsemide  (DEMADEX ) 20 MG tablet Take 1 tablet (20 mg total) by mouth at bedtime. 03/06/23   Ladona Heinz, MD  traMADol  (ULTRAM ) 50 MG tablet Take 1 tablet (50 mg total) by mouth every 6 (six) hours as needed. 01/25/24   Doretha Folks, MD  TRANSDERM-SCOP 1 MG/3DAYS Place 1 patch (1.5 mg total) onto the skin every 3 (three) days. As needed for nausea 10/21/22   Clark, Meghan R, PA-C  Vitamin D, Ergocalciferol, (DRISDOL) 1.25 MG (50000 UNIT) CAPS capsule Take 50,000 Units by mouth once a week. 09/20/22   [provider]    Allergies: Metoclopramide , Codeine, Hydrocodone , and Percocet [oxycodone -acetaminophen ]    Review of Systems Negative except as per HPI Updated Vital Signs BP (!) 148/106   Pulse 72   Temp 98.3 F (36.8 C)   Resp 20   Ht 5' 8 (1.727 m)   Wt (!) 141.1 kg   LMP 03/24/2017   SpO2 99%   BMI 47.30 kg/m   Physical Exam Vitals and nursing note reviewed.  Constitutional:      General: She is not in acute distress.    Appearance: She is well-developed. She is obese. She is not diaphoretic.  HENT:      Head: Normocephalic and atraumatic.  Pulmonary:     Effort: Pulmonary effort is normal.  Abdominal:     Palpations: Abdomen is soft.     Tenderness: There is abdominal tenderness.     Comments: Very mild generalized tenderness    Musculoskeletal:        General: Tenderness present. No swelling.     Lumbar back: Tenderness present. No bony tenderness.       Back:     Right lower leg: No tenderness. No edema.     Left lower leg: No tenderness. No edema.   Skin:    General: Skin is warm and dry.     Findings: No erythema or rash.   Neurological:     Mental Status: She is alert and oriented to person, place, and time.   Psychiatric:        Behavior: Behavior normal.     (all labs ordered are listed, but only abnormal results are displayed) Labs Reviewed  COMPREHENSIVE METABOLIC PANEL WITH GFR - Abnormal; Notable for the following components:      Result Value   CO2 18 (*)    Glucose, Bld 179 (*)    BUN 21 (*)    Creatinine, Ser 1.30 (*)    GFR, Estimated 49 (*)    All other components within normal limits  CBC WITH DIFFERENTIAL/PLATELET - Abnormal; Notable for the following components:   Hemoglobin 11.4 (*)    All other components within normal limits  URINALYSIS, ROUTINE W REFLEX MICROSCOPIC - Abnormal; Notable for the following components:   Color, Urine AMBER (*)    APPearance HAZY (*)    Specific Gravity, Urine 1.033 (*)    Protein, ur 100 (*)    All other components within normal limits  LIPASE, BLOOD    EKG: None  Radiology: No results found.   Procedures   Medications Ordered in the ED  HYDROmorphone  (DILAUDID ) injection 0.5 mg (has no administration in time range)  diphenhydrAMINE  (BENADRYL ) capsule 25 mg (has no administration in time range)  metoCLOPramide  (REGLAN ) tablet 10 mg (has no administration in time range)                                    Medical Decision Making Risk Prescription drug management.   This patient presents to  the ED for concern of back pain, abdominal pain, v/n, this involves an extensive number of treatment options, and is a complaint that carries with it a high risk of complications and morbidity.  The differential diagnosis includes but not limited to UTI, gastritis, electrolyte/metabolic abnormality.    Co morbidities / Chronic conditions that complicate the patient evaluation  Bells palsy, sjogrens syndrome, HTN, HLD, anginal pain, OSA, anxiety, GERD, CHF, CKD   Additional history obtained:  Additional history obtained from EMR External records from outside source obtained and reviewed including prior labs and imaging reviewed, had CT stone study 01/25/24 with non obstructing right intrarenal stone, small hiatal hernia    Lab Tests:  I Ordered, and personally interpreted labs.  The pertinent results include:  UA with protein, previously with leuks and nitrites that have now resolved. CBC without significant findings. CMP without significant changes from prior    Cardiac Monitoring: / EKG:  The patient was maintained on a cardiac monitor.  I personally viewed and interpreted the cardiac monitored which showed an underlying rhythm of: NSR, rate 74   Problem List / ED Course / Critical interventions / Medication management  53 year old female returns to the ER with abdominal pain, vomiting, ongoing low back pain. Feels like the antibiotics given for her UTI are causing abdominal pain and vomiting. Mild generalized tenderness on exam, with with and left lower back pain. Labs reassuring compared to prior obtained here a few days ago. CT at that visit also reassuring. Plan is to provide anti emetic and pain med of patient's choice, stop abx, follow up with PCP. Low back pain likely MSK in origin, may benefit from PT.  I ordered medication including Reglan , Benadryl , Dilaudid  Reevaluation of the patient after these medicines showed that the patient tolerating p.o.'s I have reviewed the patients  home medicines and have made adjustments as needed   Social Determinants of Health:  Has PCP   Test / Admission - Considered:  Stable for dc with plan to follow up with PCP      Final diagnoses:  Acute bilateral low back pain without sciatica  Generalized abdominal pain  Nausea and vomiting, unspecified vomiting type    ED Discharge Orders     None          Beverley Leita LABOR, PA-C 01/28/24 0402    Carita Senior, MD 01/28/24 0840

## 2024-01-28 NOTE — Discharge Instructions (Signed)
 STOP your antibiotics. Follow up with your primary care for recheck. You may benefit from physical therapy for your back pain.

## 2024-01-29 LAB — URINE CULTURE: Culture: >=100000 — AB

## 2024-01-30 ENCOUNTER — Telehealth (HOSPITAL_BASED_OUTPATIENT_CLINIC_OR_DEPARTMENT_OTHER): Payer: Self-pay | Admitting: *Deleted

## 2024-01-30 NOTE — Telephone Encounter (Signed)
 Post ED Visit - Positive Culture Follow-up  Culture report reviewed by antimicrobial stewardship pharmacist: [name] [x]  [name], [place].D. []  [name], Pharm.D., BCPS AQ-ID []  [name], Pharm.D., BCPS []  [name], Pharm.D., BCPS []  [name], [place].D., BCPS, AAHIVP []  [name], Pharm.D., BCPS, AAHIVP []  [name], PharmD, BCPS []  [name], PharmD, BCPS []  [name], PharmD, BCPS []  [name], PharmD []  [name], PharmD, BCPS []  [name], PharmD  [name] []  [name], PharmD []  [name], PharmD []  [name], PharmD []  [name], Rph []  [name], PharmD []  [name], PharmD []  [name], PharmD []  [name], PharmD []  [name], PharmD []  [name], PharmD []  [name], PharmD []  [name], PharmD []  [name], PharmD   Positive urine culture Treated with cephalexin , organism sensitive to the same and no further patient follow-up is required at this time.  [name] 01/30/2024, 9:23 AM

## 2024-01-30 NOTE — H&P (Signed)
 [facility] [employer] [facility] Medicine History & Physical   Subjective  CC:n/v  History of Present Illness:  [name] is a 54 y.o. female DM with presumed gastroparesis, obesity, SLE, HFpEF, htn, hx DVT chronically on eliquis  2.5 BID  This is her 3rd Er visit for same issue in 1 week. She has non localizing abd pain and low back pain with associated n/v. She had received abx for presumed UTI however she has no dysuria nor frequency/urgency. She has been labeled with gastroparesis and had a negative gastric study in 2021. Last year she had a very similar flare which resolved with reglan  and benadryl  combination. She has some concern about her back pain too. She denies any injuries/trauma. There are no paraesthesias. Denies bowel nor bladder issues.   So far she feels the dilaudid  is the only thing that has helped.  She does follow in the chronic pain clinic.   In the Er this time her CT a/p is unremarkable for etiology. Her CT spine shows chronic disease without acute issues.  Her labs are c/w mild dehydration.   We are asked to eval for admit due to unrelenting intractable n/v   Review of Systems: no melena, cough, fever, chills, palpitations, dyspnea, headaches, vision changes, syncope.  All others reviewed and negative.   Past Medical History:  Diagnosis Date  . Abdominal visceral abscess  (*) 10/24/2022  . CHF (congestive heart failure)  (*)   . Diabetes mellitus  (*)   . Diabetic gastroparesis  (*)   . Hypertension   . Ileus  (*) 11/16/2022   Developed nausea, inability to tolerate p.o., and abdominal pain about 2 weeks ago. Imaging at that time showed ileus. Most recent imaging still showed dilated SB, although symptoms have slowly improved since then. Having BM. Still nauseated. She is self-restricting diet  . Lupus (*)   . Venous thromboembolism    No past surgical history on file. No family history on  file.  Social History   Socioeconomic History  . Marital status: Single  Tobacco Use  . Smoking status: Never  . Smokeless tobacco: Never  Vaping Use  . Vaping status: Never Used  Substance and Sexual Activity  . Alcohol  use: Never  . Drug use: Never   Prior to Admission medications   Medication Sig Start Date End Date Taking? Authorizing Provider  ADVAIR HFA 230-21 MCG/ACT inhaler Inhale two puffs into the lungs 2 (two) times daily. 09/06/22  Yes Historical Provider, MD  albuterol  sulfate (PROVENTIL ) 2.5 mg/3 mL nebulizer solution Take 3 mLs (2.5 mg dose) by nebulization every 6 (six) hours as needed for Wheezing.   Yes Historical Provider, MD  albuterol  sulfate HFA (VENTOLIN  HFA) 108 (90 Base) MCG/ACT inhaler Inhale two puffs into the lungs every 4 (four) hours as needed for Wheezing.   Yes Historical Provider, MD  apixaban  (ELIQUIS ) 2.5 mg tablet Take one tablet (2.5 mg dose) by mouth 2 (two) times daily.   Yes Historical Provider, MD  BENLYSTA 200 MG/ML SOAJ Inject 1 mL (200 mg dose) into the skin once a week. THURSDAY 07/28/22  Yes Historical Provider, MD  buPROPion  hcl (WELLBUTRIN  XL) 300 mg 24 hr tablet Take one tablet (300 mg dose) by mouth every morning.   Yes Historical Provider, MD  carvedilol  (COREG ) 25 mg tablet Take one and a half tablets (37.5 mg dose) by mouth 2 (two) times daily. 01/15/23  Yes Tanda DELENA Dunnings, MD  cycloSPORINE  (  RESTASIS ) 0.05 % ophthalmic emulsion Place one drop into both eyes 3 (three) times a day.   Yes Historical Provider, MD  ENTRESTO  97-103 MG TABS per tablet Take one tablet by mouth 2 (two) times daily. 12/08/22  Yes Historical Provider, MD  ergocalciferol (VITAMIN D2) 50,000 units CAPS capsule Take one capsule (50,000 Units dose) by mouth once a week. [facility] 06/09/22  Yes Historical Provider, MD  famotidine  (PEPCID ) 20 MG tablet Take one tablet (20 mg dose) by mouth 2 (two) times daily. 07/18/22  Yes Historical Provider, MD  fluticasone  propionate  (FLONASE ) 50 mcg/actuation nasal spray one spray by Both Nostrils route 2 (two) times a day as needed for Rhinitis. 12/08/22  Yes Historical Provider, MD  HUMULIN  R U-500 KWIKPEN 500 UNIT/ML SOPN Inject one hundred Units into the skin 30 (thirty) minutes before meals. 02/24/22  Yes Historical Provider, MD  hydroxychloroquine  (PLAQUENIL ) 200 MG tablet Take one tablet (200 mg dose) by mouth 2 (two) times daily. 10/10/22  Yes Historical Provider, MD  JARDIANCE  10 MG TABS tablet Take one tablet (10 mg dose) by mouth every morning. 03/03/22  Yes Historical Provider, MD  levothyroxine  sodium (SYNTHROID ,LEVOTHROID,LOVOXYL) 50 mcg tablet Take one tablet (50 mcg dose) by mouth every morning. 07/19/22  Yes Historical Provider, MD  megestrol  (MEGACE ) 40 mg tablet Take one tablet (40 mg dose) by mouth 2 (two) times daily. 11/25/22  Yes Historical Provider, MD  metFORMIN  ER (GLUCOPHAGE -XR) 500 mg 24 hr tablet Take two tablets (1,000 mg dose) by mouth 2 (two) times daily.   Yes Historical Provider, MD  ondansetron  (ZOFRAN -ODT) 4 mg disintegrating tablet Take one tablet (4 mg dose) by mouth every 8 (eight) hours as needed for Nausea. 10/14/22  Yes Historical Provider, MD  oxybutynin  (DITROPAN -XL) 5 MG 24 hr tablet Take four tablets (20 mg dose) by mouth every morning.   Yes Historical Provider, MD  pregabalin  (LYRICA ) 75 mg capsule Take one capsule (75 mg dose) by mouth 3 (three) times a day.   Yes Historical Provider, MD  QUEtiapine  fumarate (SEROQUEL ) 300 mg tablet Take one tablet (300 mg dose) by mouth at bedtime.   Yes Historical Provider, MD  rosuvastatin  calcium  (CRESTOR ) 20 mg tablet Take one tablet (20 mg dose) by mouth every morning. 08/17/22  Yes Historical Provider, MD  spironolactone  (ALDACTONE ) 50 mg tablet Take one tablet (50 mg dose) by mouth every morning.   Yes Historical Provider, MD  topiramate  (TOPAMAX ) 25 MG tablet Take three tablets (75 mg dose) by mouth at bedtime.   Yes Historical Provider, MD    Allergies[1]  Objective  Temp:  [98.6 F (37 C)-98.7 F (37.1 C)]  Heart Rate:  [82-90]  Resp:  [14-18]  BP: (123-177)/(63-91)  SpO2:  [97 %-99 %]     Physical Exam   Constitutional - resting supine in bed- no distress, morbid obesity  Eyes - pupils equal round and reactive to light and accomodation, extra ocular movements intact Nose - no gross deformity or drainage Mouth - no oral lesions noted Throat - no swelling or erythema   CV - (+)S1S2, no murmurs; no JVD or peripheral edema  Resp - CTA bilaterally, no wheezing or crackles; no clubbing or cyanosis  GI - (+)BS, soft, non localizing TTP, non-distended MSK - ROM normal Skin - no rashes or wounds Neuro - alert, aware, oriented to person/place/time  Psych - normal mood and affect   Patient has skin breakdown/impairment on Admission?: no  Labs: Recent Labs    Units  01/29/24 2330  WBC thou/mcL 12.0*  HGB gm/dL 88.9*  HCT % 64.4  PLT thou/mcL 206   Recent Labs    Units 01/29/24 2330  NA mmol/L 140  K mmol/L 3.7  CL mmol/L 107  CO2 mmol/L 20  BUN mg/dL 18  CREATININE mg/dL 8.69*  CALCIUM  mg/dL 9.2  BILITOT mg/dL 0.7  AST U/L 11  ALT U/L 8  ALKPHOS U/L 93  ALBUMIN  gm/dL 4.3   Recent Labs    Units 01/29/24 2330  TSH mcIU/mL 1.34   No results found for: T4 No results for input(s): LABPROT, INR, PTT in the last 168 hours. No results for input(s): TROPONIN, CK in the last 168 hours.  Invalid input(s): CK-MB Recent Labs    Units 01/29/24 2330  BNP pg/mL 171         Pertinent Radiological findings (summarize):  CT Chest Abdomen Pelvis W IV Contrast  Final Result  IMPRESSION:  1.  Nonobstructive RIGHT renal tiny stone.  2.  Otherwise, unremarkable examination.    Electronically Signed by: Dorn Burkes on 01/30/2024 2:30 AM    CT Spine Lumbar Reconstructions  Final Result  IMPRESSION:     1. No acute findings.  2. Multilevel facet arthrosis predominantly right-sided.     Electronically Signed by: Dorn Burkes on 01/30/2024 2:33 AM    XR Chest Ap Portable  Final Result  IMPRESSION:   No acute cardiopulmonary disease.    Electronically Signed by: Dorn Burkes on 01/30/2024 12:15 AM        Assessment  [name] is a 53 y.o. female with the following problems:  Principal Problem:   Nausea and vomiting Active Problems:   Gastroparesis   History of systemic lupus erythematosus (SLE) (*)   Morbid obesity (*)   Hypothyroidism   History of venous thromboembolism   Diabetes mellitus with gastroparesis (*)   Gastroparesis due to DM (*)   Plan: Intractable N/V c/w gastroparesis in a DM patient  She reports prior success with benadryl  in combination with reglan  (this combo does not result in adverse reaction to reglan ). I will only do PO benadryl  yet will start wit IV reglan  and transition to PO once taking Po.   I do not suspect an underlying infectious process thus ni ABX at this time.   Provide IVF until PO intake improves.   DMt2   Start with surveillance while challenging PO's. Last glucose was 116  Home regimen metformin  and U500-take 100units prior to meals-this would be difficult to do a SSI  Check A1C  Advance diet as tolerated  HFpEF  Entresto , coreg .  aldactone , crestor   Monitor fluids status closely   Daily weights   Hx DVT  Chronically on eliquis  2.5BID  Hypothyroid  Synthroid   Hx Lupus  This is less likely a lupus flare. Given her level of DM no empiric steroids  Morbid obesity  Certaily affects all aspects of care. She will need close PPC follow up and guidance  Chronic pain  Spine imaging shows no acute issues   OP pain clinic  Lyrica  -this is only med listed in drug database   Abnormal blood counts:N/A, present on admission Plan: Repeat CBC in AM and See plan above  Fluid and electrolyte disorder:N/A present on admission Plan:Repeat BMP in AM and See plan above  Nutritional status: Body mass index  is 50.18 kg/m. morbid obesity Plan:See plan aboveClear liquid diet  Debility: N/A  Plan: N/A   Fluids IVF: IVF  Prophylaxis DVT: Eliquis   General Code status: Full code Surrogate decision maker is family PCP: Madelin LITTIE Brought, MD [phone]    Disposition Admit to floor    Discussed plan of care with nurse I have discussed the diagnoses and care plan with the patient.    Time spent on admission: 75 minutes  Ann MALVA Lamer, NP 01/30/2024 10:38 AM      [1] Allergies Allergen Reactions  . Codeine Itching, Other and Rash    (Derm)  . Metoclopramide  Swelling, Anxiety and Other    Hives  Developed restless leg, akathisia type limb movements.   Developed restless leg, akathisia type limb movements.   Developed restless leg, akathisia type limb movements   Developed restless leg, akathisia type limb movements.  . Oxycodone -Acetaminophen  Hives and Rash    Tolerates dilaudid   Tolerates dilaudid   Tolerates acetaminophen   . Penicillins Itching  . Hydrocodone  Hives, Other and Rash  . Oxycodone  Hives, Other and Rash

## 2024-02-04 NOTE — Progress Notes (Signed)
 [facility] Focused Physical Therapy - Initial Evaluation  Patient Name:  [name] Date of Birth:  1970/08/30  Today's Date: February 04, 2024  PT Diagnosis:    Assessment    Prior level of function Current level of mobility Current functional limitations/ impairments Rehab potential  Modified Independent with functional transfers, Modified Independent with ambulation             Patient presents with c/o N/V, abdominal and back pain, CT negative for acute process. This is pts 3rd visit to ER in 1 week for same issue. PMH includes DM with presumed gastroparesis, obesity, SLE, HFpEF, htn, hx DVT chronically on eliquis  2.5 BID. Pt is cleared for PT eval this date, demonstrates above noted functional limitations.  Functional mobility is primarily limited by BLE generalized weakness, R weaker than L and decreased activity tolerance.   Pt will benefit from skilled PT to maximize functional independence.     Plan for Current Admission   Needs during current admission Treatment/Interventions PT Frequency Duration of Treatment  Continued skilled PT   Bed mobility;Transfer training;Balance training;Gait training;Functional mobility training;Patient/support person education 3x/wk  Duration of Treatment start date:: 02/04/24 Duration of Treatment end date:: 03/06/24    Recommendations for Discharge   Anticipated intensity of rehab at next level of care Anticipated caregiver needs at next level of care DME Recommendations  Multiple times per week OP PT Full time indirect  Has all needed equipment    Barriers to Discharge Home  Need to confirm available assistance for current level of function   Recommendations above align with AMPAC score of Basic Mobility 17.  Recommendations provided are based on today's PT assessment, however, final discharge decisions are based on input from the interdisciplinary care team and may differ from these  recommendations.  Subjective  Pt pleasant, agreeable to PT evaluation.   Problem List Problem List[1] History Past Medical History:  Diagnosis Date  . Abdominal visceral abscess  (*) 10/24/2022  . CHF (congestive heart failure)  (*)   . Diabetes mellitus  (*)   . Diabetic gastroparesis  (*)   . Hypertension   . Ileus  (*) 11/16/2022   Developed nausea, inability to tolerate p.o., and abdominal pain about 2 weeks ago. Imaging at that time showed ileus. Most recent imaging still showed dilated SB, although symptoms have slowly improved since then. Having BM. Still nauseated. She is self-restricting diet  . Lupus (*)   . Venous thromboembolism    No past surgical history on file.  Objective  Spoke with nursing.  Nursing cleared patient to participate in therapy.    Patient left in chair with no lines present. Call bell and other needs in reach and chair alarm refused by patient/caregiver, nurse notified.  Family present for session? no   Activity Tolerance: VSS, no s/s distress with activity     Precautions  Other Precautions: fall risk Precautions discussed with:: Patient;Nurse    Home Living/Prior Function: pt lives alone, has CGs 7 days a week for 3 1/12 to 3 3/4 hours per day. They assist with household chores, bathing and transportation.    DME: RW, tub transfer bench  Pain  10 on a scale of 10.  Location/Description: back Intervention provided: PT to tolerance, pt stating dilaudid  is the only thing that helps.     WFL ABN COMMENTS  Orientation/Command Following x    ROM x    Strength x  BLE grossly 4/5, RLE slightly weaker  than L  Sensation x  LT intact, no reports N/T  Balance Sitting   Good, sitting EOB with BUE support   Standing   Fair, heavy BUE support on RW, CGA for steadiness  Transfers Sit ---- Stand   CGA from recliner, cues for BUE placement   Bed ---- Chair   CGA with RW  Gait            Distance, Assistance and Assistive Device   ~26ft with  RW, CGA for steadiness, decreased foot clearance BLE, cues for increased step height and keeping BLE within AD and keeping RW at proper distance for increased upright posture  Stairs   NT  Today's Treatment   PT eval completed, pt educated on acute PT role and POC, importance of staff assist for all mobility, continued assist from CGs and RW at D/C for safety, HHPT vs outpatient PT.     Functional Tests AM-PAC Basic Mobility Turning from your back to your side while in a flat bed without using bedrails?: A little Moving from lying on your back to sitting on the side of a flat bed without using bedrails?: A little Moving to and from a bed to a chair (including a wheelchair)?: A little Standing up from a chair using your arms (e.g., wheelchair, or bedside chair)? : A little Walking in hospital room?: A little Climbing 3-5 steps with a railing?: A lot (anticipated) AM-PAC Basic Mobility Raw Score (out of 24): 17 Routine Mobility Goal: Standing 1 OR More Minutes Wash Face, Comb Hair, Shave, Brush Teeth 5     Goals      Physical Therapy Care Plan (Active)     Problem: Mobility, Impaired     Dates: Start:  02/04/24       Goal: Bed Mobility     Dates: Start:  02/04/24    Expected End:  03/06/24      Description: Patient will perform bed mobility with mod I assistance.      Goal: Transfers     Dates: Start:  02/04/24    Expected End:  03/06/24      Description: Patient will perform basic transfer with mod I assistance.      Goal: Ambulation     Dates: Start:  02/04/24    Expected End:  03/06/24      Description: Patient will ambulate >100 feet with LRAD with mod I assistance.        Key (I=independent, ModI=modified independent, S=supervision, CGA=contact guard assist, Min=minimal assist, Mod=moderate assist, Max=maximal assist, D= dependent)    Plan of Care was discussed with and agreed upon by Patient/Caregiver: Yes  Evaluation / Treatment Time  Today's  Evaluation/Treatment: 1418 - 1442 Total Time: 24 min Treatment Day: 1   Charges  Total Time Code Treatment Minutes:    Evaluation Charges $ PT Evaluation: Mod Complex  Blakeley McFetridge, DPT 02/04/2024 2:58 PM     [1] Patient Active Problem List Diagnosis  . Gastroparesis  . Nausea and vomiting  . Poorly-controlled hypertension  . History of systemic lupus erythematosus (SLE) (*)  . History of Sjogren's disease (*)  . Morbid obesity (*)  . Hypokalemia  . Hypothyroidism  . History of venous thromboembolism  . Chronic anticoagulation  . Diabetes mellitus with gastroparesis (*)  . History of heart failure  . Gastroparesis due to DM (*)

## 2024-02-04 NOTE — Care Plan (Signed)
 PT goals initiated Blakeley McFetridge, DPT 02/04/2024 / 2:58 PM

## 2024-02-04 NOTE — Discharge Summary (Signed)
 [facility]  Discharge Summary  PCP: Madelin LITTIE Brought, MD Discharge Details   Admit date:         01/29/2024 Discharge date and time:       02/06/2024 Hospital LOS:    9 days  Active Hospital Problems   Diagnosis Date Noted POA  . Gastroparesis due to DM (*) 01/30/2024 Yes  . Morbid obesity (*) 12/11/2022 Yes  . History of systemic lupus erythematosus (SLE) (*) 12/11/2022 Yes  . Hypothyroidism 12/11/2022 Yes  . History of venous thromboembolism 12/11/2022 Not Applicable  . Diabetes mellitus with gastroparesis (*) 12/11/2022 Yes    Resolved Hospital Problems   Diagnosis Date Noted Date Resolved POA  . *Nausea and vomiting 12/11/2022 02/06/2024 Yes  . Gastroparesis 12/11/2022 02/06/2024 Yes      Current Discharge Medication List     START taking these medications      Details  baclofen 10 mg tablet Commonly known as: LIORESAL  Take two tablets (20 mg dose) by mouth 3  times a day as needed for up to 30 days. Indication: Muscle Spasm Quantity: 40 tablet   diphenhydrAMINE  25 mg capsule Commonly known as: BANOPHEN ,BENADRYL   Take 1 capsule by mouth 4 times a day as needed (Gastroparesis; take with Reglan ). Indication: Gatsroparesis Quantity: 30 capsule   HYDROmorphone  2 mg tablet Commonly known as: DILAUDID   Take 1 tablet by mouth every 6 hours as needed for up to 5 days. Max Daily: 8 mg Quantity: 15 tablet   lidocaine  5% Commonly known as: LIDODERM  Start taking on: February 07, 2024  Place one patch onto the skin daily for 30 days. Remove & Discard patch within 12 hours or as directed by MD Indication: Allodynia Quantity: 30 patch   metoclopramide  HCl 10 mg tablet Commonly known as: REGLAN   Take one tablet (10 mg dose) by mouth 4 (four) times a day as needed (Gastroparesis). Indication: Delayed or Stopped Emptying of Stomach Quantity: 30 tablet   predniSONE  20 mg tablet Commonly known as: DELTASONE  Start  taking on: February 07, 2024  Take two tablets (40 mg dose) by mouth daily for 4 days. Indication: Back pain Quantity: 8 tablet       CONTINUE these medications which have CHANGED      Details  pregabalin  100 mg capsule Commonly known as: LYRICA  What changed:  medication strength how much to take  Take one capsule (100 mg dose) by mouth 3 (three) times a day. Max Daily Amount: 300 mg Indication: Neuropathic Pain Quantity: 270 capsule       CONTINUE these medications which have NOT CHANGED      Details  ADVAIR HFA 230-21 MCG/ACT inhaler Generic drug: fluticasone -salmeterol  Inhale two puffs into the lungs 2 (two) times daily.   * albuterol  sulfate 2.5 mg/3 mL nebulizer solution Commonly known as: PROVENTIL   Take 3 mLs (2.5 mg dose) by nebulization every 6 (six) hours as needed for Wheezing.   * VENTOLIN  HFA 108 (90 Base) MCG/ACT inhaler Generic drug: albuterol  sulfate HFA  Inhale two puffs into the lungs every 4 (four) hours as needed for Wheezing.   BENLYSTA 200 MG/ML Soaj Generic drug: Belimumab  Inject 1 mL (200 mg dose) into the skin once a week. THURSDAY   buPROPion  hcl 300 mg 24 hr tablet Commonly known as: WELLBUTRIN  XL  Take one tablet (300 mg dose) by mouth every morning.   carvedilol  25 mg tablet Commonly known as: COREG   Take one  and a half tablets (37.5 mg dose) by mouth 2 (two) times daily. Quantity: 60 tablet   ELIQUIS  2.5 mg tablet Generic drug: apixaban   Take one tablet (2.5 mg dose) by mouth 2 (two) times daily.   ENTRESTO  97-103 MG Tabs per tablet Generic drug: sacubitril -valsartan   Take one tablet by mouth 2 (two) times daily.   ergocalciferol 50,000 units Caps capsule Commonly known as: Vitamin D2  Take one capsule (50,000 Units dose) by mouth once a week. WEDNESDAY   famotidine  20 mg tablet Commonly known as: PEPCID   Take one tablet (20 mg dose) by mouth 2 (two) times daily.   fluticasone  propionate 50 mcg/actuation nasal  spray Commonly known as: FLONASE   one spray by Both Nostrils route 2 (two) times a day as needed for Rhinitis.   HUMULIN  R U-500 KWIKPEN 500 UNIT/ML Sopn Generic drug: Insulin  Regular Human (Conc)  Inject one hundred Units into the skin 30 (thirty) minutes before meals.   hydroxychloroquine  200 MG tablet Commonly known as: PLAQUENIL   Take one tablet (200 mg dose) by mouth 2 (two) times daily.   JARDIANCE  10 mg Tabs tablet Generic drug: empagliflozin   Take one tablet (10 mg dose) by mouth every morning.   levothyroxine  sodium 50 mcg tablet Commonly known as: SYNTHROID ,LEVOTHROID,LOVOXYL  Take one tablet (50 mcg dose) by mouth every morning.   megestrol  40 mg tablet Commonly known as: MEGACE   Take one tablet (40 mg dose) by mouth 2 (two) times daily.   metFORMIN  ER 500 mg 24 hr tablet Commonly known as: GLUCOPHAGE -XR  Take two tablets (1,000 mg dose) by mouth 2 (two) times daily.   ondansetron  4 mg disintegrating tablet Commonly known as: ZOFRAN -ODT  Take one tablet (4 mg dose) by mouth every 8 (eight) hours as needed for Nausea.   oxybutynin  5 MG 24 hr tablet Commonly known as: DITROPAN -XL  Take four tablets (20 mg dose) by mouth every morning.   QUEtiapine  fumarate 300 mg tablet Commonly known as: SEROQUEL   Take one tablet (300 mg dose) by mouth at bedtime.   RESTASIS  0.05 % ophthalmic emulsion Generic drug: cycloSPORINE   Place one drop into both eyes 3 (three) times a day.   rosuvastatin  calcium  20 mg tablet Commonly known as: CRESTOR   Take one tablet (20 mg dose) by mouth every morning.   spironolactone  50 mg tablet Commonly known as: ALDACTONE   Take one tablet (50 mg dose) by mouth every morning.   topiramate  25 MG tablet Commonly known as: TOPAMAX   Take three tablets (75 mg dose) by mouth at bedtime.      * * This list has 2 medication(s) that are the same as other medications prescribed for you. Read the directions carefully, and ask your doctor or other  care provider to review them with you.         * You might also be taking other medications not listed above. If you have questions about any of your other medications, talk to the person who prescribed them or your Primary Care Provider.           Reason for medication changes:As below  Hospital Course   Indication for Admission/chief complaint: N/V, Back pain  Hospital Course:       From H&P of Ann Lamer, WE:Tjwij Cedric Paone is a 53 y.o. female DM with presumed gastroparesis, obesity, SLE, HFpEF, htn, hx DVT chronically on eliquis  2.5 BID   This is her 3rd Er visit for same issue in 1 week. She has non  localizing abd pain and low back pain with associated n/v. She had received abx for presumed UTI however she has no dysuria nor frequency/urgency. She has been labeled with gastroparesis and had a negative gastric study in 2021. Last year she had a very similar flare which resolved with reglan  and benadryl  combination. She has some concern about her back pain too. She denies any injuries/trauma. There are no paraesthesias. Denies bowel nor bladder issues.    So far she feels the dilaudid  is the only thing that has helped.  She does follow in the chronic pain clinic.    In the Er this time her CT a/p is unremarkable for etiology. Her CT spine shows chronic disease without acute issues.  Her labs are c/w mild dehydration.    We are asked to eval for admit due to unrelenting intractable n/v   The rest of the discharge summary will be in problem based format:  Intractable N/V likely 2/2 gastroparesis flare -She was started on Reglan /Baclofen with improvement of her symptoms. She was able to eventually tolerate a regular diet and was symptom free at discharge.  She will continue Reglan  as needed at discharge.   AKI - improved Hyperkalemia - Creatinine was 1.3 on admission but improved to 1.1 and subsequently worsened to 2.3.  This was likely due to her holding in her urine as  she did not want to move due to her back pain.  Her urine output has been good.  She was treated with IV fluids.  Reviewed her chart and it appears her baseline is around 1.5 so she has close to that.  Her K was elevated at 5.5 and she was treated with Lokelma.  Her Entresto  and Aldactone  were held but she can continue these at discharge.  Cr was stable at 1.5 at discharge.  T2DM - She was initially just on a sliding scale due to her poor p.o. intake.  Blood sugars became elevated after she started eating and she was started on Lantus  and sliding scale insulin .  At discharge she can continue her home regimen of Metformin  and U-500.  HFpEF not in exacerbation -Continue coreg , Entresto , Aldactone , and crestor   Hx DVT -Continue Eliquis   hypothyroidism -Continue Levothyroxine   Hx Lupus Chronic pain syndrome Chronic back pain -Continue OP f/u with pain clinic.  Her Lyrica  was increased to 100 mg from 75.  Will continue baclofen 3 times daily as needed for back spasms and complete a Prednisone  taper.  Will also continue Lidoderm  patch and provide a small supply of PRN PO Dilaudid .  She was evaluated by physical therapy as well and outpatient referral to PT has been placed.   Recommendations to physicians/followup needed: -PCP f/u in 7-10 days -complete Prednisone  burst for low back pain -Lidoderm  patch started for low back -outpatient referral to PT  Physical Exam: Vitals:   02/06/24 0803  BP: (!) 113/58  Pulse: 79  Resp: 16  Temp: 98.2 F (36.8 C)  SpO2: 96%   Constitutional - resting comfortably, no acute distress Eyes - pupils equal round and reactive to light Nose - no gross deformity or drainage Mouth - no oral lesions noted Throat - no swelling or erythema CV - (+)S1S2, no murmurs; no peripheral edema Resp - CTA bilaterally, no wheezing or crackles;  GI - (+)BS, soft, NTTP, non-distended MSK - ROM normal   Skin - no rashes or wounds Neuro - alert, aware, oriented to  person/place/time  Psych - normal affect and mood  Labs  on Discharge:  Recent Labs    Units 02/05/24 0231 02/04/24 0232 02/03/24 0223 02/02/24 0910 01/31/24 0512  WBC thou/mcL 9.9 9.1 10.6* 8.8 9.6  HGB gm/dL 9.3* 9.9* 89.9* 9.8* 89.7*  HCT % 29.2* 31.8* 32.0* 31.0* 32.7*  PLT thou/mcL 172 193 191 175 181   Recent Labs    Units 02/05/24 0231 02/04/24 0232 02/03/24 0223 02/02/24 0910 02/01/24 0913 01/31/24 0512  NA mmol/L 138 135* 138 140 138 138  K mmol/L 4.5 5.5* 4.5 4.0 4.0 3.6*  CL mmol/L 111* 110* 109* 109* 110* 108  CO2 mmol/L 18* 17* 18* 19* 19* 19*  BUN mg/dL 35* 35* 34* 28* 25* 14  CREATININE mg/dL 8.49* 8.39* 8.30* 8.09* 2.30* 1.16*  CALCIUM  mg/dL 8.9 8.9 8.8 8.6* 8.5* 9.0   Recent Labs    Units 01/31/24 0512  BILITOT mg/dL 0.7  AST U/L 9  ALT U/L 9  ALKPHOS U/L 82  ALBUMIN  gm/dL 3.5   Recent Labs    Units 01/31/24 0512  HGBA1C % 7.3*   No results for input(s): LABPROT, INR, PTT in the last 168 hours. No results for input(s): CHOL, LDL, HDL, TRIG in the last 168 hours. No results for input(s): TROPONIN, CK in the last 168 hours.  Invalid input(s): CK-MB  Diagnostics: CT Chest Abdomen Pelvis W IV Contrast  Final Result  IMPRESSION:  1.  Nonobstructive RIGHT renal tiny stone.  2.  Otherwise, unremarkable examination.    Electronically Signed by: Dorn Burkes on 01/30/2024 2:30 AM    CT Spine Lumbar Reconstructions  Final Result  IMPRESSION:     1. No acute findings.  2. Multilevel facet arthrosis predominantly right-sided.    Electronically Signed by: Dorn Burkes on 01/30/2024 2:33 AM    XR Chest Ap Portable  Final Result  IMPRESSION:   No acute cardiopulmonary disease.    Electronically Signed by: Dorn Burkes on 01/30/2024 12:15 AM       Post Hospital Care   Discharge Procedure Orders  Follow-up with Primary Care Physician  Referral Priority: Urgent Referral Type: Consultation  Referral Reason:  Evaluate and Return  Number of Visits Requested: 1 Expiration Date: 08/02/24   Follow up with Primary Care Physician:  Referral Priority: Urgent Referral Type: Consultation  Referral Reason: Evaluate and Return  Number of Visits Requested: 1 Expiration Date: 08/03/24   Ambulatory referral to Physical Therapy Evaluation and Treatment  Referral Priority: Routine Referral Type: Physical Therapy  Referral Reason: Evaluate and Return  Requested Specialty: Physical Therapist  Number of Visits Requested: 1 Expiration Date: 08/03/24   Consistent Carbohydrate Diet (diabetic)   Activity as tolerated  Order Comments: I have provided you with Benadryl  and Reglan  to use as needed if your gastroparesis/nausea/vomiting/abdominal pain start again.  I have started you on a Lidoderm  patch for you back pain and you will take a few more days of a steroid called Prednisone .  I have also provided you with a pain medication called Dilaudid  and muscle relaxer called Baclofen to use as needed.  Your Lyrica  has been adjusted to 100 mg three times per day.   Potential for Rehab: Good Code Status: Full Code Disposition: Home Consults: None  Followup appointments: No future appointments.  Time spent in discharge process:  Documentation for time-based billing:  Total time spent of date of service was 40 minutes.   Onesimo Lynwood Mate, MD 02/06/2024 / 12:57 PM   *Some images could not be shown.

## 2024-02-05 NOTE — Progress Notes (Signed)
 NICS Progress Note [facility] General Medicine Progress Note  Date of Admission:  01/29/2024 Length of Stay:  6 Days  Assessment/Plan:  [name] is a 53 y.o. Black or African American [2] female with:  Assessment: Principal Problem:   Nausea and vomiting Active Problems:   Gastroparesis   History of systemic lupus erythematosus (SLE) (*)   Morbid obesity (*)   Hypothyroidism   History of venous thromboembolism   Diabetes mellitus with gastroparesis (*)   Gastroparesis due to DM (*)   Assessment Plan  Intractable N/V likely 2/2 gastroparesis flare -Continue Reglan  and benadryl  -CT A/P without acute abnormality  AKI - improved Hyperkalemia -Likely 2/2 to N/V and poor PO intake. Creatinine 1.3 on admission, improved to 1.1 but then worsened to 2.3; 1.5 today.  -Reviewed her chart again, appears her baseline creatinine is actually around 1.5.  -K 4.5 today. S/p lokelma yesterday.  -Holding entresto  and aldactone   T2DM -Continue Accuchecks -Continue Lantus  20u BID and SSI.  -Home regimen is Metformin  and U500 100U AC; will have to discuss with either pharmacy or diabetic educator prior  HFpEF not in exacerbation -Continue coreg  and crestor  -Holding entresto  and aldactone    Hx DVT -Continue Eliquis   hypothyroidism -Continue Levothyroxine   Hx Lupus Chronic pain syndrome Chronic back pain -Continue OP f/u -Continue Lyrica  100mg  TID. Follows as OP at pain clinic.  -Continue Baclofen TID PRN for back pain/spasms. Continue scheduled tylenol  1000mg  TID. ContinueLidocaine patches. -Ambulate daily -PT eval.     Fluids, electrolytes, nutrition Fluids: IVF, monitor lytes and replete prn, GI Soft/Low Fiber Diet Percent Meals Eaten (%): 50 %  Prophylaxis DVT: Eliquis    GI:  PPI Foley catheter:no Telemetry:no     General  Ethics: Full Code  Surrogate decision maker   PCP: Madelin LITTIE Brought, MD [phone] Specialists:       Disposition Remain  in hospital. Hopefully discharge tomorrow.   Discussed plan of care with RN and Case Manager I have discussed the diagnoses and care plan with the patient.    Chief complaint:abd pain, N/V.   Subjective/Events Overnight: Sitting up in chair at bedside. She continues to have back pain. Tolerating diet. Discussed going home today but she asked if she could stay one more day.   Objective   Vital signs in last 24 hours: Intake/Output:  Temp:  [98 F (36.7 C)-98.4 F (36.9 C)] 98 F (36.7 C) Heart Rate:  [84-87] 84 Resp:  [18] 18 BP: (121-148)/(73-78) 148/78 SpO2:  [95 %-100 %] 95 %    Weight change:  Wt Readings from Last 1 Encounters:  02/04/24 (!) 170.2 kg (375 lb 3.6 oz)     Intake/Output Summary (Last 24 hours) at 02/05/2024 1156 Last data filed at 02/05/2024 0919 Gross per 24 hour  Intake 1120 ml  Output 600 ml  Net 520 ml        Physical Exam   Constitutional - resting comfortably, no acute distress Eyes - pupils equal round and reactive to light Nose - no gross deformity or drainage Mouth - no oral lesions noted Throat - no swelling or erythema    CV - (+)S1S2, no murmurs; no peripheral edema Resp - CTA bilaterally, no wheezing or crackles;  GI - (+)BS, soft, NTTP, non-distended MSK - ROM normal   Skin - no rashes or wounds Neuro - alert, aware, oriented to person/place/time  Psych - normal affect and mood    Recent Labs    Units 02/05/24 0231  WBC thou/mcL 9.9  HGB gm/dL 9.3*  HCT % 70.7*  PLT thou/mcL 172   Recent Labs    Units 02/05/24 0231  NA mmol/L 138  K mmol/L 4.5  CL mmol/L 111*  CO2 mmol/L 18*  BUN mg/dL 35*  CREATININE mg/dL 8.49*  CALCIUM  mg/dL 8.9   Recent Labs    Units 01/29/24 2330  MAGNESIUM  mg/dL 1.8   Recent Labs    Units 01/31/24 0512 01/29/24 2330  TSH mcIU/mL  --  1.34  HGBA1C % 7.3*  --    Recent Labs    Units 01/31/24 0512  BILITOT mg/dL 0.7  AST U/L 9  ALT U/L 9  ALKPHOS U/L 82  ALBUMIN  gm/dL 3.5   No  results for input(s): LABPROT, INR, PTT in the last 168 hours. No results for input(s): CHOL, LDL, HDL, TRIG in the last 168 hours. Recent Labs    Units 02/05/24 0759 02/05/24 0231 02/04/24 2123 02/04/24 1700 02/04/24 1227 02/04/24 0820  GLUCOSE mg/dL 880* 858* 843* 815* 823* 128*   No results for input(s): TROPONIN, CK in the last 168 hours.  Invalid input(s): CK-MB Recent Labs    Units 01/29/24 2330  BNP pg/mL 171    Microbiology Results     ** No results found for the last 72 hours. **       Pertinent Radiological findings (summarize):  Reviewed  No results found.    Cardiac tracings:  Reviewed  . acetaminophen   1,000 mg Oral TID  . apixaban   2.5 mg Oral BID  . budesonide-formoterol   2 puff Inhalation BID RSP  . buPROPion  hcl  300 mg Oral q AM  . carvedilol   37.5 mg Oral BID  . cycloSPORINE   1 drop Both Eyes TID  . diphenhydrAMINE   25 mg Oral AC  . hydroxychloroquine   200 mg Oral BID  . insulin  glargine  0.25 Units/kg/day Subcutaneous BID  . insulin  lispro (HUMALOG ,ADMELOG )  2-10 Units Subcutaneous PC  . insulin  lispro (HUMALOG ,ADMELOG )  2-16 Units Subcutaneous MealsHS  . levothyroxine  sodium  50 mcg Oral Daily 0600  . lidocaine   1 patch Transdermal Q24H  . metoclopramide  HCl  10 mg Oral AC  . NaCl  10 mL Intracatheter 2 times per day  . oxybutynin   20 mg Oral q AM  . pantoprazole   40 mg IntraVENous Daily  . pregabalin   100 mg Oral TID  . QUEtiapine  fumarate  300 mg Oral HS  . rosuvastatin  calcium   20 mg Oral q AM  . [Held by Provider] sacubitril -valsartan   1 tablet Oral BID  . [Held by Provider] spironolactone   50 mg Oral q AM  . topiramate   75 mg Oral HS   . NaCl     acetaminophen  **OR** acetaminophen , acetaminophen  **OR** acetaminophen , albuterol  sulfate, baclofen, benzonatate , calcium  carbonate, dextrose  **OR** dextrose  **OR** dextrose  **OR** glucagon , diclofenac  sodium, fluticasone  propionate, HYDROmorphone , HYDROmorphone ,  labetalol , melatonin, NaCl, NaCl **AND** NaCl, naloxone  (NARCAN ) 0.04 mg/mL 10 mL injection (mix on floor) **AND** naloxone  (NARCAN ) 0.04 mg/mL 10 mL injection (mix on floor), ondansetron  **OR** ondansetron , polyethylene glycol  Documentation for time-based billing:  Total time spent of date of service was 40 minutes.  Patient care activities included preparing to see the patient such as reviewing the patient record, obtaining and/or reviewing separately obtained history, performing a medically appropriate history and physical examination, counseling and educating the patient, family, and/or caregiver, ordering prescription medications, tests, or procedures, referring and communicating with other health care providers when not separately reported during the visit,  documenting clinical information in the electronic or other health record, independently interpreting results when not separately reported, communicating results to the patient/family/caregiver, and coordinating the care of the patient when not separately reported.    Oliva DELENA Muck, DO 02/05/2024 11:56 AM  *The following note was dictated using voice recognition software.  Although the note was reviewed prior to submission, spelling errors and misused words could occur.  Please be aware that these errors may be present.  Please contact me if there is any concern that an error is present that would alter the meaning of the note*

## 2024-02-08 NOTE — Progress Notes (Signed)
 Case Information: Primary Care Physician: Madelin Rachel Brought, MD Upcoming appointments:  Future Appointments  Date Time Provider Department Center  02/13/2024  2:00 PM Lorren Everette Canny, FNP [facility] AFF[facility] [address]  06/17/2024 11:20 AM Vernell Geraldine Geralds, MD [facility] RHE MPM [facility] [place]   Patient educated on: importance of attending hospital follow up to address back pain, abdominal pain, nausea/vomiting, continued management of symptoms and obtaining any new prescriptions, refills and referrals to specialists. taking medications for nausea. vomiting and back pain as prescribed and notifying provider for any new, worsening or unrelieved pain. Strict ED precautions reviewed, fall safety precautions and prevention and bleeding risks while taking Eliquis .  Active Ambulatory Problems    Diagnosis Date Noted  . Other forms of systemic lupus erythematosus (HCC) 07/19/2011  . Polymyositis associated with autoimmune disease (HCC) 07/19/2011  . Bell's palsy 07/19/2011  . Hyperlipidemia 07/19/2011  . Neuropathic pain of lower extremity 01/12/2012  . Chronic pain syndrome 04/09/2014  . Morbid obesity with BMI of 50.0-59.9, adult (HCC) 06/04/2014  . High risk medications (not anticoagulants) long-term use 11/26/2014  . Chronic combined systolic and diastolic heart failure (HCC) 10/09/2012  . Chronic migraine without aura without status migrainosus, not intractable 02/06/2015  . Chronic respiratory failure (HCC) 10/09/2012  . Diabetic gastroparesis (HCC) 04/28/2013  . Drug-induced constipation 04/21/2013  . Essential hypertension 11/08/2011  . Family history of malignant neoplasm of breast 12/01/2014  . Family history of malignant neoplasm of ovary 12/01/2014  . Insulin -requiring or dependent type II diabetes mellitus (HCC) 03/13/2014  . OSA on CPAP 12/01/2014  . Polypharmacy 11/26/2014  . Prolonged QT interval 03/06/2014  . Rectal bleeding 12/01/2014  . Supplemental oxygen dependent  11/09/2016  . OAB (overactive bladder) 06/13/2017  . Chronic pain of right knee 07/04/2017  . Endometrial hyperplasia 04/20/2018  . Sciatica 04/20/2018  . Dysfunctional uterine bleeding 06/15/2018  . Vitamin B 12 deficiency 08/24/2018  . Hepatic adenoma 09/24/2019  . Liver hemorrhage 08/21/2019  . Pneumonia due to COVID-19 virus 01/29/2020  . Esophageal dysmotility 06/03/2021  . Lesion of liver 06/03/2021  . Congestive heart failure (HCC) 09/28/2020  . Normocytic anemia 12/22/2021  . Major depressive disorder with single episode, in partial remission (HCC) 08/19/2019  . Liver mass 09/28/2020  . History of CVA (cerebrovascular accident) 08/02/2022  . Nausea & vomiting 12/05/2022  . Intractable vomiting with nausea 12/10/2022  . History of pulmonary embolism 12/28/2022   Resolved Ambulatory Problems    Diagnosis Date Noted  . Exertional dyspnea 02/06/2015  . Bilateral pulmonary embolism (HCC) 06/03/2021  . Annual physical exam 09/28/2021  . Abdominal pain 12/04/2022   Past Medical History:  Diagnosis Date  . Anxiety   . Chronic diastolic heart failure    (CMD) 10/09/2012  . Chronic pain   . CVA (cerebral vascular accident)    (CMD) 08/19/2019  . Diabetes mellitus    (CMD) 07/19/2011  . Generalized muscle weakness   . GERD (gastroesophageal reflux disease)   . H/O: CVA (cerebrovascular accident)   . Hemoperitoneum, nontraumatic 08/21/2019  . Hypertension with goal to be determined   . Mixed connective tissue disease (CMD)   . Obesity   . Osteoporosis   . Pneumonia   . Sjogren's syndrome (CMD) 07/19/2011  . SLE-Sjogren overlap syndrome    (CMD) 04/09/2014  . Stroke    (CMD)   . Systemic lupus erythematosus    (CMD) 07/19/2011  . Unspecified essential hypertension   . Varicella  Social Drivers of Health   Living Situation: Low Risk  (02/07/2024)   Living Situation   . What is your living situation today?: I have a steady place to live   . Think about the place you  live. Do you have problems with any of the following? Choose all that apply:: None/None on this list  Food Insecurity: Medium Risk (02/07/2024)   Food vital sign   . Within the past 12 months, you worried that your food would run out before you got money to buy more: Never true   . Within the past 12 months, the food you bought just didn't last and you didn't have money to get more: Sometimes true  Transportation Needs: No Transportation Needs (02/07/2024)   Transportation   . In the past 12 months, has lack of reliable transportation kept you from medical appointments, meetings, work or from getting things needed for daily living? : No  Utilities: Low Risk  (02/07/2024)   Utilities   . In the past 12 months has the electric, gas, oil, or water  company threatened to shut off services in your home? : No  Alcohol  Screening: Not At Risk (01/04/2023)   Received from [facility]   AUDIT-C   . Q1: How often do you have a drink containing alcohol ?: Never   . Q2: How many drinks containing alcohol  do you have on a typical day when you are drinking?: Patient does not drink   . Q3: How often do you have six or more drinks on one occasion?: Never  Tobacco Use: Medium Risk (02/07/2024)   Patient History   . Smoking Tobacco Use: Former   . Smokeless Tobacco Use: Never   . Passive Exposure: Never  Depression: Not at risk (02/07/2024)   PHQ-2   . PHQ-2 Score: 0     Care Management Note:  Situation: Patient agrees and is now enrolled in the Transitional Care Management program  Background: ED visit at [facility] 01-24-24 for Acute on chronic congestive heart failure, unspecified heart failure type, Acute cystitis, Acute low back pain without sciatica, ED visit 01-27-24 at [facility] for acute bilateral back pain. IP admission  at [facility] 01/29/2024-02/06/2024 for N/V, lumbar pain pain, abdominal pain, bilious vomiting  Overdue Annual Physical last 09/29/2021 with Care Gaps  noted  Assessment: ACN outreached member, identified self and care management services. Member is agreeable to care management. Discharge instructions available in Care Everywhere reviewed. Member voiced understanding of all instructions, when to notify provider and strict ED precautions. Member reported she did pick up prescriptions for Baclofen, diphenhydramine . Lidocaine  patch, metoclopramide , prednisone  . Member reported she had to pay out of pocket for the benadryl  and lidocaine  patch because they were not covered.  Member reported she started having back pain prior to admission and did seek evaluation in the ED 01/24/2024 and  01/27/2024 before admission 01/29/2024.CT spine completed during admission shows chronic disease without acute issues Multilevel facet arthrosis predominantly right sided. Member discharged home  on Baclofen for muscle spasms, dilaudid  for pain, lidocaine  patch ,prednisone , lyrica  was increased to 100 mg TID . Referral for PT was placed per discharge instructions. Member has not been called for an appointment. Member instructed to follow up with PCP in 7-10 days.SABRA COLLEGE encouraged member to discuss PT referral with provider if she is not contacted for a PT appointment by her hospital follow up appointment. ACN called with member on a 3-way call to [employer] to  assist with scheduling a Hospital Follow up appointment. Appointment scheduled for 02/13/2024 at 2 pm with Loren Swanson . Member reported she is still having back pain but is unable to describe the pain. Member rates the pain  as a 10 at its worst. Member reported she does get some relief with the pain medication. Member reported she is walking and does use a cane or walker at her baseline to get around. Member has fallen in the past. Education provided on fall safety and prevention and bleeding risks while taking eliquis . Member voiced understanding.   Member has had 2 recent ED visits this month with abdominal pain, nausea  vomiting and back pain and hospital admission. Member reported she was treated with antibiotics at one of the ED visits for a UTI. Per [facility] EMR note members nausea and vomiting were treated with Reglan  and Benadryl . Member did have an increase in creatinine up to 2.3 presumed to be due to poor PO intake. Day of discharge Cr 1.5. Member reported she is doing well since discharge. Member reported she has had no further nausea or vomiting since discharge. Member reported she is drinking fluids and her appetite is getting better. Member reported she is moving her bowels and bladder without difficulty. Member reported she is taking Reglan  and Benadryl  as needed for nausea and vomiting. ACN encouraged to attend hospital follow up to ensure her gastroparesis is properly managed. Goal set. Member will not have any further ED visits or hospitalization related to abdominal pain, back pain, nausea or vomiting.  Medication reconciliation completed. Member has multiple providers outside of the [employer] that prescribe medications . Member reported she knows what she is taking and has no medication concerns. Member reported she does need refills on some of her medications. Member was instructed to contact her pharmacy to make sure she does have refills and if not contact the providers for needed refills. ACN will task medication reconciliation to the pharmacy team for further review and identify if there are any fill history concerns or discrepancies in medication lists. ACN will ask Pharmacy team to outreach patient for medication management if needed to ensure medication compliance.  Member reported she is a diabetic and does take insulin , metformin  and  jardiance . Member does not have a working glucometer. ACN educated member on the importance of checking BS while taking insulin . ACN will message members provider to inquire if a prescription for a new glucometer with testing supplies can be sent in for member.  Goal set. ACN discussed member is overdue for her annual physical and encouraged her to schedule and discuss Care Gaps.   Member reported she does live alone but has her sister and niece that help her and are supportive. Member receives [employer] through [place] and has an aide that comes in 7 days a week for 3 1/2 hours a day. Member does receive food stamps. Member would like a list of food pantries to use if needed. Member reported she uses the [employer] transportation .Member reported she does rent through [place] and denies any problems with rent or utility billsat this time. Member reported she does receive SSI. Member does not have a dentist and would like a dental listing. Education provided on the [employer], Added benefits, transportation . ACN emailed member a copy of the member handbook, added benefits, Environmental manager for [employer] and a Psychologist, occupational for [employer].   Recommendation: Attend hospital follow up appointment as scheduled,  obtain refills on needed medications, notify provider for any new or worsening abdominal pain, nausea, vomiting or back pain, strict ED precautions, fall safety precautions. ACN sent care plan with ACN contact information to member via the [facility] Patient Portal. Next scheduled call for [facility] week 2 02/14/2024    Community Services referrals: [employer]:  (Member reported she uses the [employer] transportation) Food Resources: Food resources (Member does receive food stamps. Member would like a list of food pantries to use if needed. ACN emailed to member) Additional Resources:  (Education provided on the [employer], Added benefits, transportation . ACN emailed member a copy of the Human resources officer, added benefits, food Animal nutritionist for [employer] and a Psychologist, occupational for [employer])  Care Plan: Review Care Plan     * Review Care Plan (Active)     *  General Care Plan     * Member needs a new glucometer     Description: Member does not have a working glucometer and is a diabetic taking insulin , metformin  and jardiance     * Member will obtain a new glucometer with diabetic testing supplies to check blood sugars with in the next 3 months     Priority: High    * ACN will message members provider to request a new glucometer with testing supplies to be sent to hr pharmacies       * Member will receive a new glucometer with testing supplies with in the next 4 weeks           * Member with several recent ED visits for nausea, vomiting, abdominal and back pain and wants relief     Description: IP discharge. Member with IP admission  at [facility] 01/29/2024-02/06/2024 for N/V, lumbar pain, abdominal pain    * Member will not have any ED visits or hospitalizations related to nausea, vomiting or back pain over the next 3 months     Priority: High    * ACN will continue to educate member on the importance of attending hospital follow up appointment to address ongoing symptoms , continued management of symptoms and obtaining any new prescriptions, refills and referrals to specialists.        * ACN will continue to educate member on the importance of taking medications for nausea,vomiting and back pain as prescribed and notifying provider for any new, worsening or unrelieved pain, nausea or vomiting. Strict ED precautions reviewed.        * Member will attend hospital follow up appointment 02/13/2024 with Roderick Canny and discuss ongoing symptoms       * Member will complete prednisone  pack and take baclofen and dilaudid  as prescribed and use lidocaine  patch as directed over the next week     Description:       * Member will take Reglan  and benadryl  as directed for ongoing nausea and vomiting     Description:               * Review Care Plan (Resolved)     There are no resolved problems.             Discussed  ACP with member. Educated member about the importance of having a Engineer, site of Attorney/Agent and Merchant navy officer. Denies need for further information at this time.   Kate Star RN, BSN, [facility], [place] [address] Office Number [phone]   *  Some images could not be shown.

## 2024-02-09 ENCOUNTER — Emergency Department (HOSPITAL_COMMUNITY)
Admission: EM | Admit: 2024-02-09 | Discharge: 2024-02-10 | Disposition: A | Discharge status: Home / Self Care | Attending: Emergency Medicine | Admitting: Emergency Medicine

## 2024-02-09 ENCOUNTER — Encounter (HOSPITAL_COMMUNITY): Payer: Self-pay

## 2024-02-09 ENCOUNTER — Other Ambulatory Visit: Payer: Self-pay

## 2024-02-09 DIAGNOSIS — Z79899 Other long term (current) drug therapy: Secondary | ICD-10-CM | POA: Diagnosis not present

## 2024-02-09 DIAGNOSIS — Z7984 Long term (current) use of oral hypoglycemic drugs: Secondary | ICD-10-CM | POA: Diagnosis not present

## 2024-02-09 DIAGNOSIS — G8929 Other chronic pain: Secondary | ICD-10-CM | POA: Diagnosis not present

## 2024-02-09 DIAGNOSIS — I509 Heart failure, unspecified: Secondary | ICD-10-CM | POA: Diagnosis not present

## 2024-02-09 DIAGNOSIS — Z7901 Long term (current) use of anticoagulants: Secondary | ICD-10-CM | POA: Diagnosis not present

## 2024-02-09 DIAGNOSIS — R600 Localized edema: Secondary | ICD-10-CM | POA: Insufficient documentation

## 2024-02-09 DIAGNOSIS — Z794 Long term (current) use of insulin: Secondary | ICD-10-CM | POA: Diagnosis not present

## 2024-02-09 DIAGNOSIS — E119 Type 2 diabetes mellitus without complications: Secondary | ICD-10-CM | POA: Diagnosis not present

## 2024-02-09 DIAGNOSIS — I110 Hypertensive heart disease with heart failure: Secondary | ICD-10-CM | POA: Diagnosis not present

## 2024-02-09 DIAGNOSIS — D72829 Elevated white blood cell count, unspecified: Secondary | ICD-10-CM | POA: Diagnosis not present

## 2024-02-09 DIAGNOSIS — M545 Low back pain, unspecified: Secondary | ICD-10-CM | POA: Diagnosis present

## 2024-02-09 NOTE — ED Provider Triage Note (Signed)
 Emergency Medicine Provider Triage Evaluation Note  [name] , a 53 y.o. female  was evaluated in triage.  Pt complains of chronic lower back pain. Reports recent admission to [facility] for gastroparesis flare and having back pain at that time. Currently being treated with lidoderm  patch. Also reports peripheral edema since being discharged.  Review of Systems  Positive: frequency Negative: Fevers, chest pain, sob, change in gait, lower extremity weakness  Physical Exam  BP (!) 152/64   Pulse 90   Temp 98.2 F (36.8 C) (Oral)   Resp 18   Ht 5' 8 (1.727 m)   Wt (!) 149.7 kg   LMP 03/24/2017   SpO2 100%   BMI 50.18 kg/m  Gen:   Awake, no distress   Resp:  Normal effort  MSK:   Moves extremities, limited by pain Other:  1+ BLE edema  Medical Decision Making  Medically screening exam initiated at 11:46 PM.  Appropriate orders placed.  [name] was informed that the remainder of the evaluation will be completed by another provider, this initial triage assessment does not replace that evaluation, and the importance of remaining in the ED until their evaluation is complete.  Labs and imaging ordered   [name] Raynell Moder, MD 02/09/24 2350

## 2024-02-09 NOTE — ED Triage Notes (Addendum)
 Pt arrived from home via [facility] c/o bilateral flank pain, lower back pain, and BLE edema. Pt believes she may have a UTI. Pt states that she was recently discharged from [facility] for same.

## 2024-02-09 NOTE — Progress Notes (Signed)
 See note that I sent 1 of you, and see if I can see her for hospital follow-up as she has so many problems etc.

## 2024-02-09 NOTE — Progress Notes (Signed)
 Jaz handled this

## 2024-02-09 NOTE — Progress Notes (Signed)
 She is scheduled with Lorren on 02/13/24 at 2 pm but we only have 11:20 and 4 pm. Will you let me put her in the 4 pm slot and still have her to come at 2 pm but we will be over booked but the 4 pm slots will be filled.

## 2024-02-10 ENCOUNTER — Emergency Department (HOSPITAL_COMMUNITY)

## 2024-02-10 LAB — CBG MONITORING, ED: Glucose-Capillary: 168 mg/dL — ABNORMAL HIGH (ref 70–99)

## 2024-02-10 LAB — URINALYSIS, ROUTINE W REFLEX MICROSCOPIC
Bacteria, UA: NONE SEEN
Bilirubin Urine: NEGATIVE
Glucose, UA: NEGATIVE mg/dL
Hgb urine dipstick: NEGATIVE
Ketones, ur: NEGATIVE mg/dL
Nitrite: NEGATIVE
Protein, ur: NEGATIVE mg/dL
Specific Gravity, Urine: 1.024 (ref 1.005–1.030)
pH: 5 (ref 5.0–8.0)

## 2024-02-10 LAB — CBC
HCT: 31.1 % — ABNORMAL LOW (ref 36.0–46.0)
Hemoglobin: 9.9 g/dL — ABNORMAL LOW (ref 12.0–15.0)
MCH: 28.4 pg (ref 26.0–34.0)
MCHC: 31.8 g/dL (ref 30.0–36.0)
MCV: 89.4 fL (ref 80.0–100.0)
Platelets: 195 K/uL (ref 150–400)
RBC: 3.48 MIL/uL — ABNORMAL LOW (ref 3.87–5.11)
RDW: 14.2 % (ref 11.5–15.5)
WBC: 12.7 K/uL — ABNORMAL HIGH (ref 4.0–10.5)
nRBC: 0 % (ref 0.0–0.2)

## 2024-02-10 LAB — BRAIN NATRIURETIC PEPTIDE: B Natriuretic Peptide: 114.4 pg/mL — ABNORMAL HIGH (ref 0.0–100.0)

## 2024-02-10 LAB — I-STAT VENOUS BLOOD GAS, ED
Acid-base deficit: 5 mmol/L — ABNORMAL HIGH (ref 0.0–2.0)
Bicarbonate: 19.3 mmol/L — ABNORMAL LOW (ref 20.0–28.0)
Calcium, Ion: 1.23 mmol/L (ref 1.15–1.40)
HCT: 31 % — ABNORMAL LOW (ref 36.0–46.0)
Hemoglobin: 10.5 g/dL — ABNORMAL LOW (ref 12.0–15.0)
O2 Saturation: 71 %
Potassium: 3.8 mmol/L (ref 3.5–5.1)
Sodium: 145 mmol/L (ref 135–145)
TCO2: 20 mmol/L — ABNORMAL LOW (ref 22–32)
pCO2, Ven: 31 mmHg — ABNORMAL LOW (ref 44–60)
pH, Ven: 7.403 (ref 7.25–7.43)
pO2, Ven: 36 mmHg (ref 32–45)

## 2024-02-10 LAB — COMPREHENSIVE METABOLIC PANEL WITH GFR
ALT: 18 U/L (ref 0–44)
AST: 38 U/L (ref 15–41)
Albumin: 3.7 g/dL (ref 3.5–5.0)
Alkaline Phosphatase: 64 U/L (ref 38–126)
Anion gap: 11 (ref 5–15)
BUN: 39 mg/dL — ABNORMAL HIGH (ref 6–20)
CO2: 19 mmol/L — ABNORMAL LOW (ref 22–32)
Calcium: 9.6 mg/dL (ref 8.9–10.3)
Chloride: 112 mmol/L — ABNORMAL HIGH (ref 98–111)
Creatinine, Ser: 1.58 mg/dL — ABNORMAL HIGH (ref 0.44–1.00)
GFR, Estimated: 39 mL/min — ABNORMAL LOW (ref >60)
Glucose, Bld: 219 mg/dL — ABNORMAL HIGH (ref 70–99)
Potassium: 4.2 mmol/L (ref 3.5–5.1)
Sodium: 142 mmol/L (ref 135–145)
Total Bilirubin: 0.8 mg/dL (ref 0.0–1.2)
Total Protein: 7.2 g/dL (ref 6.5–8.1)

## 2024-02-10 LAB — LIPASE, BLOOD: Lipase: 31 U/L (ref 11–51)

## 2024-02-10 MED ORDER — KETOROLAC TROMETHAMINE 15 MG/ML IJ SOLN
15.0000 mg | Freq: Once | INTRAMUSCULAR | Status: AC
  Administered 2024-02-10: 15 mg via INTRAVENOUS
  Filled 2024-02-10: qty 1

## 2024-02-10 MED ORDER — SODIUM CHLORIDE 0.9% FLUSH: 10.0000 mL | INTRAVENOUS | Status: DC | PRN

## 2024-02-10 MED ORDER — METHOCARBAMOL 1000 MG/10ML IJ SOLN
1000.0000 mg | Freq: Once | INTRAMUSCULAR | Status: AC
  Administered 2024-02-10: 1000 mg via INTRAVENOUS
  Filled 2024-02-10: qty 10

## 2024-02-10 MED ORDER — HYDROMORPHONE HCL 1 MG/ML IJ SOLN
0.5000 mg | Freq: Once | INTRAMUSCULAR | Status: AC
  Administered 2024-02-10: 0.5 mg via INTRAVENOUS
  Filled 2024-02-10: qty 1

## 2024-02-10 NOTE — ED Notes (Signed)
 Pt taken to xray

## 2024-02-10 NOTE — ED Provider Notes (Signed)
 [facility] AT [facility] Provider Note  CSN: [id number] Arrival date & time: 02/09/24 2309  Chief Complaint(s) Back Pain, Flank Pain, and Leg Swelling  HPI [name] is a 53 y.o. female  here for exacerbation of chronic lower back pain. Reports recent admission to [facility] for gastroparesis flare and having back pain at that time. Currently being treated with lidoderm  patch.  Patient denies any fall or trauma.  States that she ambulates with a walker at home.  Reports that she has been taking her prescribed Dilaudid  but did not take it this afternoon since she was babysitting her nephew.  Also reports peripheral edema since being discharged.    Positive:          frequency Negative:         Fevers, chest pain, sob, change in gait, lower extremity weakness  The history is provided by the patient.    Past Medical History Past Medical History:  Diagnosis Date   Acute renal failure (HCC)     Intractable nausea vomiting secondary to diabetic gastroparesis causing dehydration and acute renal failure /notes 04/01/2013   Anginal pain (HCC)    Anxiety    Bell's palsy 08/09/2010   Bicornuate uterus    CAP (community acquired pneumonia) 10/2011   thelbert 11/08/2011   Chest pain 01/13/2014   CHF (congestive heart failure) (HCC)    Diabetic gastroparesis associated with type 2 diabetes mellitus (HCC)    this is presumed diagnoses, not confirmed by any studies.    Eczema    Family history of breast cancer    Family history of malignant neoplasm of breast    Family history of malignant neoplasm of ovary    Family history of ovarian cancer    Family history of prostate cancer    GERD (gastroesophageal reflux disease)    High cholesterol    Hypertension    Liver lesion 08/13/2019   Liver mass 2014   biopsied 03/2013 at [facility], not malignant.  is to undergo radiologic ablation of the mass in sept/October 2014.    Lupus    Migraines    maybe a couple  times/yr (04/01/2013)   Obesity    Obstructive sleep apnea on CPAP 2011   Oxygen at nights   Pulmonary embolism (HCC) 05/21/2021   SJOGREN'S SYNDROME 08/09/2010   SLE (systemic lupus erythematosus) (HCC)    thelbert 12/01/2010   Patient Active Problem List   Diagnosis Date Noted   Malnutrition of moderate degree 04/27/2023   Anxiety and depression 04/25/2023   Ileus (HCC) 11/16/2022   Hypokalemia 11/16/2022   Hypomagnesemia 11/16/2022   Hypothyroidism 11/16/2022   Oral candidiasis 11/16/2022   History of venous thromboembolism 11/16/2022   Severe sepsis (HCC) 11/09/2022   Acute metabolic encephalopathy 10/24/2022   Intra-abdominal abscess (HCC) 10/24/2022   Nausea & vomiting 05/09/2022   Vitamin B12 deficiency 01/04/2022   History of pulmonary embolism 12/22/2021   Normocytic anemia 12/22/2021   Foot pain, bilateral 11/22/2021   Genetic testing 08/17/2021   Family history of breast cancer 07/19/2021   Family history of prostate cancer 07/19/2021   Esophageal dysmotility 06/03/2021   Physical debility 08/29/2019   Obesity hypoventilation syndrome (HCC) 08/23/2019   Skin breakdown 08/23/2019   Liver hemorrhage 08/23/2019   History of CVA (cerebrovascular accident) 08/19/2019   Mood disorder (HCC) 08/19/2019   Morbid obesity with BMI of 50.0-59.9, adult (HCC) 08/19/2019   SLE (systemic lupus erythematosus related syndrome) (HCC) 08/19/2019  Hepatic adenoma    Sciatica 04/20/2018   Type 2 diabetes mellitus with hyperlipidemia (HCC) 09/19/2015   Radiculopathy of cervical spine    Family history of malignant neoplasm of breast    Family history of malignant neoplasm of ovary    Intractable nausea and vomiting 11/11/2013   Septate uterus 09/08/2013   Diabetic gastroparesis (HCC) 04/28/2013   Constipation due to pain medication 04/21/2013   Odynophagia 04/04/2013   AKI (acute kidney injury) (HCC) 04/01/2013   Liver masses 02/28/2013   Generalized abdominal pain 02/26/2013    Reflux esophagitis 02/26/2013   Morbid obesity (HCC) 10/10/2012   Chronic diastolic CHF (congestive heart failure) (HCC) 10/09/2012   Clinical polymyositis syndrome (HCC) 09/06/2012   Mononeuritis of lower limb 01/12/2012   Chronic combined systolic and diastolic CHF (congestive heart failure) (HCC) 11/08/2011   Essential hypertension 11/08/2011   HLD (hyperlipidemia) 07/19/2011   Bell's palsy 08/09/2010   SJOGREN'S SYNDROME 08/09/2010   Home Medication(s) Prior to Admission medications   Medication Sig Start Date End Date Taking? Authorizing Provider  ADVAIR HFA 230-21 MCG/ACT inhaler Inhale 2 puffs into the lungs 2 (two) times daily. 12/20/20   [provider]  albuterol  (PROVENTIL ) (2.5 MG/3ML) 0.083% nebulizer solution Take 2.5 mg by nebulization as needed for wheezing.    [provider]  ALPRAZolam  (XANAX ) 0.5 MG tablet Take 0.5 mg by mouth as needed for anxiety or sleep. 04/19/17   [provider]  apixaban  (ELIQUIS ) 2.5 MG TABS tablet Take 1 tablet (2.5 mg total) by mouth 2 (two) times daily. 11/29/23   Ladona Heinz, MD  ARIPiprazole  (ABILIFY ) 5 MG tablet Take 5 mg by mouth daily. 01/14/21   [provider]  Azathioprine  75 MG TABS Take 1 tablet by mouth 2 (two) times daily. 01/19/23   [provider]  B-D UF III MINI PEN NEEDLES 31G X 5 MM MISC USE AS DIRECTED TWICE A DAY 10/31/18   Kassie Mallick, MD  buPROPion  (WELLBUTRIN  XL) 300 MG 24 hr tablet Take 300 mg by mouth daily. 03/14/22   [provider]  carvedilol  (COREG ) 25 MG tablet Take 1 tablet (25 mg total) by mouth 2 (two) times daily with a meal. 12/22/23   Ladona Heinz, MD  cephALEXin  (KEFLEX ) 500 MG capsule Take 1 capsule (500 mg total) by mouth 3 (three) times daily. 01/25/24   Horton, Charmaine FALCON, MD  Continuous Blood Gluc Sensor (FREESTYLE LIBRE 3 SENSOR) MISC 1 Device by Does not apply route every 14 (fourteen) days. Apply 1 sensor on upper arm every 14 days for continuous glucose  monitoring 05/25/22   Von Pacific, MD  cromolyn  (OPTICROM ) 4 % ophthalmic solution Place 1 drop into both eyes 2 (two) times daily. 01/14/21   [provider]  cycloSPORINE  (RESTASIS ) 0.05 % ophthalmic emulsion Place 1 drop into both eyes in the morning, at noon, and at bedtime.     [provider]  diclofenac  Sodium (VOLTAREN ) 1 % GEL APPLY 2 GRAMS TO AFFECTED AREA 4 TIMES A DAY Patient taking differently: Apply 2 g topically 4 (four) times daily as needed (pain). 01/11/22   Cantwell, Celeste C, PA-C  diphenhydrAMINE  (BENADRYL ) 50 MG tablet Take 0.5 tablets (25 mg total) by mouth every 6 (six) hours as needed for itching. 05/01/23   Verdene Purchase, MD  EASY TOUCH PEN NEEDLES 32G X 4 MM MISC USE TO INJECT INSULIN  TWICE DAILY 08/07/19   Kassie Mallick, MD  empagliflozin  (JARDIANCE ) 10 MG TABS tablet Take 1 tablet by  mouth daily. 03/03/22   [provider]  famotidine  (PEPCID ) 20 MG tablet Take 20 mg by mouth 2 (two) times daily.    [provider]  fluticasone  (FLONASE ) 50 MCG/ACT nasal spray Place 2 sprays into both nostrils continuous as needed for allergies or rhinitis.    [provider]  glucose blood (ACCU-CHEK GUIDE) test strip Use as instructed 02/22/23   Von Pacific, MD  glucose blood (ONETOUCH ULTRA) test strip Test blood sugar 4 times a day 03/30/22   Von Pacific, MD  hydroxychloroquine  (PLAQUENIL ) 200 MG tablet Take 200 mg by mouth 2 (two) times daily.    [provider]  ibuprofen  (ADVIL ) 600 MG tablet Take 1 tablet (600 mg total) by mouth every 6 (six) hours as needed. Patient taking differently: Take 600 mg by mouth every 6 (six) hours as needed for mild pain (pain score 1-3). 10/14/22   Vicky Charleston, PA-C  insulin  regular human CONCENTRATED (HUMULIN  R U-500 KWIKPEN) 500 UNIT/ML KwikPen 100 UNITS, 30 MINUTES BEFORE [facility] MEAL 11/17/22   Von Pacific, MD  Insulin  Syringe-Needle U-100 (EASY TOUCH INSULIN  SYRINGE) 31G X 5/16 1 ML MISC USE AS  DIRECTED THREE TIMES A DAY 10/12/18   Kassie Mallick, MD  isosorbide -hydrALAZINE  (BIDIL ) 20-37.5 MG tablet Take 1 tablet by mouth 3 (three) times daily.    [provider]  Lancets [facility] ULTRASOFT) lancets Test blood sugar 4 times a day 03/30/22   Von Pacific, MD  levothyroxine  (SYNTHROID ) 50 MCG tablet Take 1 tablet (50 mcg total) by mouth daily at 6 (six) AM. 08/30/19   Sheikh, Omair Latif, DO  meclizine  (ANTIVERT ) 12.5 MG tablet Take 1 tablet (12.5 mg total) by mouth 3 (three) times daily as needed for dizziness. 05/03/23   Regalado, Belkys A, MD  megestrol  (MEGACE ) 40 MG tablet Take 40 mg by mouth daily. 01/30/15   [provider]  metFORMIN  (GLUCOPHAGE -XR) 500 MG 24 hr tablet Take 2 tablets (1,000 mg total) by mouth 2 (two) times daily. 02/02/21   Kassie Mallick, MD  metoCLOPramide  (REGLAN ) 10 MG tablet Take 1 tablet (10 mg total) by mouth 3 (three) times daily before meals. 05/01/23   Krishnan, Gokul, MD  Multiple Vitamin (MULTIVITAMIN WITH MINERALS) TABS tablet Take 1 tablet by mouth daily. 08/30/19   Sherrill Cable Latif, DO  ondansetron  (ZOFRAN -ODT) 4 MG disintegrating tablet Take 1 tablet (4 mg total) by mouth every 8 (eight) hours as needed for nausea or vomiting. 10/14/22   Vicky Charleston, PA-C  oxybutynin  (DITROPAN  XL) 15 MG 24 hr tablet Take 15 mg by mouth daily.    [provider]  pantoprazole  (PROTONIX ) 40 MG tablet Take 1 tablet (40 mg total) by mouth daily at 12 noon. 05/04/23   Regalado, Owen A, MD  pregabalin  (LYRICA ) 75 MG capsule Take 75 mg 3 (three) times daily by mouth.  01/28/15   [provider]  QUEtiapine  (SEROQUEL ) 300 MG tablet Take 300 mg by mouth at bedtime. 01/08/21   [provider]  rosuvastatin  (CRESTOR ) 20 MG tablet Take 1 tablet (20 mg total) by mouth daily. 06/09/21 10/16/22  Cantwell, Celeste C, PA-C  sacubitril -valsartan  (ENTRESTO ) 97-103 MG TAKE 1 TABLET BY MOUTH TWICE A DAY 08/17/23   Ladona Heinz, MD  spironolactone   (ALDACTONE ) 50 MG tablet Take 1 tablet (50 mg total) by mouth daily. 12/22/23   Ladona Heinz, MD  sucralfate  (CARAFATE ) 1 GM/10ML suspension Take 10 mLs by mouth 2 (two) times daily as needed (abdomen pain). 12/09/20   [provider]  tiZANidine (ZANAFLEX) 4 MG capsule Take 4 mg by mouth 3 (three) times daily.    [provider]  topiramate  (TOPAMAX ) 25 MG tablet Take 75 mg by mouth at bedtime. 03/17/16   [provider]  torsemide  (DEMADEX ) 20 MG tablet Take 1 tablet (20 mg total) by mouth at bedtime. 03/06/23   Ladona Heinz, MD  traMADol  (ULTRAM ) 50 MG tablet Take 1 tablet (50 mg total) by mouth every 6 (six) hours as needed. 01/25/24   Doretha Folks, MD  TRANSDERM-SCOP 1 MG/3DAYS Place 1 patch (1.5 mg total) onto the skin every 3 (three) days. As needed for nausea 10/21/22   Clark, Meghan R, PA-C  Vitamin D, Ergocalciferol, (DRISDOL) 1.25 MG (50000 UNIT) CAPS capsule Take 50,000 Units by mouth once a week. 09/20/22   [provider]                                                                                                                                    Allergies Metoclopramide , Codeine, Hydrocodone , and Percocet [oxycodone -acetaminophen ]  Review of Systems Review of Systems As noted in HPI  Physical Exam Vital Signs  I have reviewed the triage vital signs BP (!) 146/83   Pulse 99   Temp 98.7 F (37.1 C)   Resp (!) 24   Ht 5' 8 (1.727 m)   Wt (!) 149.7 kg   LMP 03/24/2017   SpO2 100%   BMI 50.18 kg/m   Physical Exam Vitals reviewed.  Constitutional:      General: She is not in acute distress.    Appearance: She is well-developed. She is morbidly obese. She is not diaphoretic.  HENT:     Head: Normocephalic and atraumatic.     Right Ear: External ear normal.     Left Ear: External ear normal.     Nose: Nose normal.  Eyes:     General: No scleral icterus.    Conjunctiva/sclera: Conjunctivae normal.  Neck:     Trachea: Phonation  normal.  Cardiovascular:     Rate and Rhythm: Normal rate and regular rhythm.  Pulmonary:     Effort: Pulmonary effort is normal. No respiratory distress.     Breath sounds: No stridor.  Abdominal:     General: There is no distension.  Musculoskeletal:        General: Normal range of motion.     Cervical back: Normal range of motion.     Lumbar back: Tenderness present.       Back:     Right lower leg: 1+ Pitting Edema present.     Left lower leg: 1+ Pitting Edema present.  Neurological:     Mental Status: She is alert and oriented to person, place, and time.  Psychiatric:        Behavior: Behavior normal.     ED Results and Treatments Labs (all labs ordered are listed, but only  abnormal results are displayed) Labs Reviewed  URINALYSIS, ROUTINE W REFLEX MICROSCOPIC - Abnormal; Notable for the following components:      Result Value   APPearance HAZY (*)    Leukocytes,Ua TRACE (*)    All other components within normal limits  CBC - Abnormal; Notable for the following components:   WBC 12.7 (*)    RBC 3.48 (*)    Hemoglobin 9.9 (*)    HCT 31.1 (*)    All other components within normal limits  COMPREHENSIVE METABOLIC PANEL WITH GFR - Abnormal; Notable for the following components:   Chloride 112 (*)    CO2 19 (*)    Glucose, Bld 219 (*)    BUN 39 (*)    Creatinine, Ser 1.58 (*)    GFR, Estimated 39 (*)    All other components within normal limits  BRAIN NATRIURETIC PEPTIDE - Abnormal; Notable for the following components:   B Natriuretic Peptide 114.4 (*)    All other components within normal limits  CBG MONITORING, ED - Abnormal; Notable for the following components:   Glucose-Capillary 168 (*)    All other components within normal limits  I-STAT VENOUS BLOOD GAS, ED - Abnormal; Notable for the following components:   pCO2, Ven 31.0 (*)    Bicarbonate 19.3 (*)    TCO2 20 (*)    Acid-base deficit 5.0 (*)    HCT 31.0 (*)    Hemoglobin 10.5 (*)    All other  components within normal limits  LIPASE, BLOOD                                                                                                                         EKG  EKG Interpretation Date/Time:    Ventricular Rate:    PR Interval:    QRS Duration:    QT Interval:    QTC Calculation:   R Axis:      Text Interpretation:         Radiology DG Chest 2 View Result Date: 02/10/2024 CLINICAL DATA:  Peripheral swelling EXAM: CHEST - 2 VIEW COMPARISON:  01/24/2024 FINDINGS: Cardiac shadow is enlarged. Increasing pleural effusions are seen with central pulmonary vascular congestion. No bony abnormality is noted. IMPRESSION: Changes of CHF as described. Electronically Signed   By: Oneil Devonshire M.D.   On: 02/10/2024 00:41    Medications Ordered in ED Medications  sodium chloride  flush (NS) 0.9 % injection 10-40 mL (has no administration in time range)  HYDROmorphone  (DILAUDID ) injection 0.5 mg (0.5 mg Intravenous Given 02/10/24 0308)  methocarbamol  (ROBAXIN ) injection 1,000 mg (1,000 mg Intravenous Given 02/10/24 0308)  ketorolac  (TORADOL ) 15 MG/ML injection 15 mg (15 mg Intravenous Given 02/10/24 0308)   Procedures Ultrasound ED Renal  Date/Time: 02/10/2024 4:23 AM  Performed by: [name], MD Authorized by: [name], MD   Procedure details:    Indications: urinary retention     Technique:  BladderImages: archived Bladder findings:  Bladder:  Visualized   Volume:  220cc   (including critical care time) Medical Decision Making / ED Course   Medical Decision Making Amount and/or Complexity of Data Reviewed Labs: ordered. Decision-making details documented in ED Course. Radiology: ordered and independent interpretation performed. Decision-making details documented in ED Course.  Risk Prescription drug management. Parenteral controlled substances.    Chronic lower back pain.  Atraumatic.  No radiculopathy.  No bladder/bowel incontinence.   Patient is having frequency.  Bladder scan without retention.  UA without evidence of infection.  Low suspicion for AAA, cauda equina.  Provided with IV pain medicine and muscle relaxer which provided significant relief.  Patient also presented with lower extremity edema.  No shortness of breath.  She does have a history of heart failure but is not appear to be in exacerbation at this time based on BNP and chest x-ray.  Her kidney function is relatively stable.   Patient was able to stand ambulate at baseline here felt well enough to go home.  Recommended PCP follow-up    Final Clinical Impression(s) / ED Diagnoses Final diagnoses:  Chronic bilateral low back pain without sciatica  Peripheral edema   The patient appears reasonably screened and/or stabilized for discharge and I doubt any other medical condition or other [facility] requiring further screening, evaluation, or treatment in the ED at this time. I have discussed the findings, Dx and Tx plan with the patient/family who expressed understanding and agree(s) with the plan. Discharge instructions discussed at length. The patient/family was given strict return precautions who verbalized understanding of the instructions. No further questions at time of discharge.  Disposition: Discharge  Condition: Good  ED Discharge Orders     None        Follow Up: [name], MD [address] [place] [address] [phone]  Call  to schedule an appointment for close follow up    This chart was dictated using voice recognition software.  Despite best efforts to proofread,  errors can occur which can change the documentation meaning.    [name], MD 02/10/24 [phone]

## 2024-02-14 ENCOUNTER — Emergency Department (HOSPITAL_COMMUNITY)

## 2024-02-14 ENCOUNTER — Inpatient Hospital Stay (HOSPITAL_COMMUNITY)
Admission: EM | Admit: 2024-02-14 | Discharge: 2024-02-29 | DRG: 091 | Disposition: A | Discharge status: Home / Self Care | Attending: Family Medicine | Admitting: Family Medicine

## 2024-02-14 ENCOUNTER — Encounter: Payer: Self-pay | Admitting: Physician Assistant

## 2024-02-14 ENCOUNTER — Other Ambulatory Visit: Payer: Self-pay

## 2024-02-14 DIAGNOSIS — E785 Hyperlipidemia, unspecified: Secondary | ICD-10-CM | POA: Diagnosis not present

## 2024-02-14 DIAGNOSIS — Z886 Allergy status to analgesic agent status: Secondary | ICD-10-CM

## 2024-02-14 DIAGNOSIS — Z7901 Long term (current) use of anticoagulants: Secondary | ICD-10-CM

## 2024-02-14 DIAGNOSIS — G4710 Hypersomnia, unspecified: Secondary | ICD-10-CM | POA: Diagnosis present

## 2024-02-14 DIAGNOSIS — N179 Acute kidney failure, unspecified: Secondary | ICD-10-CM | POA: Diagnosis present

## 2024-02-14 DIAGNOSIS — I959 Hypotension, unspecified: Secondary | ICD-10-CM | POA: Diagnosis not present

## 2024-02-14 DIAGNOSIS — D631 Anemia in chronic kidney disease: Secondary | ICD-10-CM | POA: Diagnosis present

## 2024-02-14 DIAGNOSIS — R4182 Altered mental status, unspecified: Secondary | ICD-10-CM

## 2024-02-14 DIAGNOSIS — E1165 Type 2 diabetes mellitus with hyperglycemia: Secondary | ICD-10-CM | POA: Diagnosis present

## 2024-02-14 DIAGNOSIS — M329 Systemic lupus erythematosus, unspecified: Secondary | ICD-10-CM | POA: Diagnosis present

## 2024-02-14 DIAGNOSIS — Z888 Allergy status to other drugs, medicaments and biological substances status: Secondary | ICD-10-CM

## 2024-02-14 DIAGNOSIS — E039 Hypothyroidism, unspecified: Secondary | ICD-10-CM | POA: Diagnosis present

## 2024-02-14 DIAGNOSIS — R079 Chest pain, unspecified: Secondary | ICD-10-CM | POA: Diagnosis not present

## 2024-02-14 DIAGNOSIS — E7800 Pure hypercholesterolemia, unspecified: Secondary | ICD-10-CM | POA: Diagnosis present

## 2024-02-14 DIAGNOSIS — T426X5A Adverse effect of other antiepileptic and sedative-hypnotic drugs, initial encounter: Secondary | ICD-10-CM | POA: Diagnosis present

## 2024-02-14 DIAGNOSIS — Z823 Family history of stroke: Secondary | ICD-10-CM

## 2024-02-14 DIAGNOSIS — T428X5A Adverse effect of antiparkinsonism drugs and other central muscle-tone depressants, initial encounter: Secondary | ICD-10-CM | POA: Diagnosis present

## 2024-02-14 DIAGNOSIS — I428 Other cardiomyopathies: Secondary | ICD-10-CM | POA: Diagnosis present

## 2024-02-14 DIAGNOSIS — Z6841 Body Mass Index (BMI) 40.0 and over, adult: Secondary | ICD-10-CM

## 2024-02-14 DIAGNOSIS — R7989 Other specified abnormal findings of blood chemistry: Secondary | ICD-10-CM | POA: Diagnosis present

## 2024-02-14 DIAGNOSIS — T402X5A Adverse effect of other opioids, initial encounter: Secondary | ICD-10-CM | POA: Diagnosis present

## 2024-02-14 DIAGNOSIS — E876 Hypokalemia: Secondary | ICD-10-CM | POA: Diagnosis not present

## 2024-02-14 DIAGNOSIS — Z79890 Hormone replacement therapy (postmenopausal): Secondary | ICD-10-CM

## 2024-02-14 DIAGNOSIS — N1831 Chronic kidney disease, stage 3a: Secondary | ICD-10-CM | POA: Diagnosis present

## 2024-02-14 DIAGNOSIS — R6510 Systemic inflammatory response syndrome (SIRS) of non-infectious origin without acute organ dysfunction: Secondary | ICD-10-CM | POA: Diagnosis present

## 2024-02-14 DIAGNOSIS — E66813 Obesity, class 3: Secondary | ICD-10-CM | POA: Diagnosis present

## 2024-02-14 DIAGNOSIS — E1169 Type 2 diabetes mellitus with other specified complication: Secondary | ICD-10-CM | POA: Diagnosis present

## 2024-02-14 DIAGNOSIS — G9340 Encephalopathy, unspecified: Secondary | ICD-10-CM | POA: Diagnosis not present

## 2024-02-14 DIAGNOSIS — Z1152 Encounter for screening for COVID-19: Secondary | ICD-10-CM | POA: Diagnosis not present

## 2024-02-14 DIAGNOSIS — Z8249 Family history of ischemic heart disease and other diseases of the circulatory system: Secondary | ICD-10-CM

## 2024-02-14 DIAGNOSIS — Z800 Family history of malignant neoplasm of digestive organs: Secondary | ICD-10-CM

## 2024-02-14 DIAGNOSIS — G2581 Restless legs syndrome: Secondary | ICD-10-CM | POA: Diagnosis present

## 2024-02-14 DIAGNOSIS — Z794 Long term (current) use of insulin: Secondary | ICD-10-CM | POA: Diagnosis not present

## 2024-02-14 DIAGNOSIS — Z7951 Long term (current) use of inhaled steroids: Secondary | ICD-10-CM

## 2024-02-14 DIAGNOSIS — G928 Other toxic encephalopathy: Secondary | ICD-10-CM | POA: Diagnosis present

## 2024-02-14 DIAGNOSIS — Z86711 Personal history of pulmonary embolism: Secondary | ICD-10-CM

## 2024-02-14 DIAGNOSIS — Z560 Unemployment, unspecified: Secondary | ICD-10-CM

## 2024-02-14 DIAGNOSIS — Y92009 Unspecified place in unspecified non-institutional (private) residence as the place of occurrence of the external cause: Secondary | ICD-10-CM

## 2024-02-14 DIAGNOSIS — D649 Anemia, unspecified: Secondary | ICD-10-CM | POA: Diagnosis present

## 2024-02-14 DIAGNOSIS — I5032 Chronic diastolic (congestive) heart failure: Secondary | ICD-10-CM | POA: Diagnosis not present

## 2024-02-14 DIAGNOSIS — E1122 Type 2 diabetes mellitus with diabetic chronic kidney disease: Secondary | ICD-10-CM | POA: Diagnosis present

## 2024-02-14 DIAGNOSIS — M3500 Sicca syndrome, unspecified: Secondary | ICD-10-CM | POA: Diagnosis present

## 2024-02-14 DIAGNOSIS — F32A Depression, unspecified: Secondary | ICD-10-CM | POA: Diagnosis present

## 2024-02-14 DIAGNOSIS — Z8041 Family history of malignant neoplasm of ovary: Secondary | ICD-10-CM

## 2024-02-14 DIAGNOSIS — Z87891 Personal history of nicotine dependence: Secondary | ICD-10-CM

## 2024-02-14 DIAGNOSIS — Z79899 Other long term (current) drug therapy: Secondary | ICD-10-CM

## 2024-02-14 DIAGNOSIS — M545 Low back pain, unspecified: Secondary | ICD-10-CM | POA: Diagnosis present

## 2024-02-14 DIAGNOSIS — I214 Non-ST elevation (NSTEMI) myocardial infarction: Secondary | ICD-10-CM | POA: Diagnosis not present

## 2024-02-14 DIAGNOSIS — Z86718 Personal history of other venous thrombosis and embolism: Secondary | ICD-10-CM

## 2024-02-14 DIAGNOSIS — R531 Weakness: Principal | ICD-10-CM

## 2024-02-14 DIAGNOSIS — E1143 Type 2 diabetes mellitus with diabetic autonomic (poly)neuropathy: Secondary | ICD-10-CM | POA: Diagnosis present

## 2024-02-14 DIAGNOSIS — I5021 Acute systolic (congestive) heart failure: Secondary | ICD-10-CM | POA: Diagnosis present

## 2024-02-14 DIAGNOSIS — Z5941 Food insecurity: Secondary | ICD-10-CM

## 2024-02-14 DIAGNOSIS — I130 Hypertensive heart and chronic kidney disease with heart failure and stage 1 through stage 4 chronic kidney disease, or unspecified chronic kidney disease: Secondary | ICD-10-CM | POA: Diagnosis present

## 2024-02-14 DIAGNOSIS — I952 Hypotension due to drugs: Secondary | ICD-10-CM | POA: Diagnosis not present

## 2024-02-14 DIAGNOSIS — Z885 Allergy status to narcotic agent status: Secondary | ICD-10-CM

## 2024-02-14 DIAGNOSIS — N189 Chronic kidney disease, unspecified: Secondary | ICD-10-CM | POA: Diagnosis not present

## 2024-02-14 DIAGNOSIS — Z8042 Family history of malignant neoplasm of prostate: Secondary | ICD-10-CM

## 2024-02-14 DIAGNOSIS — I10 Essential (primary) hypertension: Secondary | ICD-10-CM | POA: Diagnosis not present

## 2024-02-14 DIAGNOSIS — Z8673 Personal history of transient ischemic attack (TIA), and cerebral infarction without residual deficits: Secondary | ICD-10-CM

## 2024-02-14 DIAGNOSIS — Z803 Family history of malignant neoplasm of breast: Secondary | ICD-10-CM

## 2024-02-14 DIAGNOSIS — K5900 Constipation, unspecified: Secondary | ICD-10-CM | POA: Diagnosis not present

## 2024-02-14 DIAGNOSIS — G4733 Obstructive sleep apnea (adult) (pediatric): Secondary | ICD-10-CM | POA: Diagnosis present

## 2024-02-14 DIAGNOSIS — Z7984 Long term (current) use of oral hypoglycemic drugs: Secondary | ICD-10-CM

## 2024-02-14 DIAGNOSIS — G8929 Other chronic pain: Secondary | ICD-10-CM | POA: Diagnosis present

## 2024-02-14 DIAGNOSIS — B9620 Unspecified Escherichia coli [E. coli] as the cause of diseases classified elsewhere: Secondary | ICD-10-CM | POA: Diagnosis present

## 2024-02-14 DIAGNOSIS — Z833 Family history of diabetes mellitus: Secondary | ICD-10-CM

## 2024-02-14 DIAGNOSIS — Z825 Family history of asthma and other chronic lower respiratory diseases: Secondary | ICD-10-CM

## 2024-02-14 DIAGNOSIS — L89320 Pressure ulcer of left buttock, unstageable: Secondary | ICD-10-CM | POA: Diagnosis present

## 2024-02-14 DIAGNOSIS — F419 Anxiety disorder, unspecified: Secondary | ICD-10-CM | POA: Diagnosis present

## 2024-02-14 DIAGNOSIS — I429 Cardiomyopathy, unspecified: Secondary | ICD-10-CM | POA: Diagnosis not present

## 2024-02-14 DIAGNOSIS — N939 Abnormal uterine and vaginal bleeding, unspecified: Secondary | ICD-10-CM | POA: Diagnosis not present

## 2024-02-14 LAB — I-STAT CHEM 8, ED
BUN: 34 mg/dL — ABNORMAL HIGH (ref 6–20)
Calcium, Ion: 1.24 mmol/L (ref 1.15–1.40)
Chloride: 108 mmol/L (ref 98–111)
Creatinine, Ser: 2.2 mg/dL — ABNORMAL HIGH (ref 0.44–1.00)
Glucose, Bld: 211 mg/dL — ABNORMAL HIGH (ref 70–99)
HCT: 29 % — ABNORMAL LOW (ref 36.0–46.0)
Hemoglobin: 9.9 g/dL — ABNORMAL LOW (ref 12.0–15.0)
Potassium: 3.9 mmol/L (ref 3.5–5.1)
Sodium: 140 mmol/L (ref 135–145)
TCO2: 17 mmol/L — ABNORMAL LOW (ref 22–32)

## 2024-02-14 LAB — CBC WITH DIFFERENTIAL/PLATELET
Abs Immature Granulocytes: 0.17 K/uL — ABNORMAL HIGH (ref 0.00–0.07)
Basophils Absolute: 0 K/uL (ref 0.0–0.1)
Basophils Relative: 0 %
Eosinophils Absolute: 0.1 K/uL (ref 0.0–0.5)
Eosinophils Relative: 0 %
HCT: 30.9 % — ABNORMAL LOW (ref 36.0–46.0)
Hemoglobin: 9.7 g/dL — ABNORMAL LOW (ref 12.0–15.0)
Immature Granulocytes: 1 %
Lymphocytes Relative: 6 %
Lymphs Abs: 1.4 K/uL (ref 0.7–4.0)
MCH: 28.4 pg (ref 26.0–34.0)
MCHC: 31.4 g/dL (ref 30.0–36.0)
MCV: 90.4 fL (ref 80.0–100.0)
Monocytes Absolute: 2.4 K/uL — ABNORMAL HIGH (ref 0.1–1.0)
Monocytes Relative: 11 %
Neutro Abs: 18.5 K/uL — ABNORMAL HIGH (ref 1.7–7.7)
Neutrophils Relative %: 82 %
Platelets: 166 K/uL (ref 150–400)
RBC: 3.42 MIL/uL — ABNORMAL LOW (ref 3.87–5.11)
RDW: 14.1 % (ref 11.5–15.5)
WBC: 22.5 K/uL — ABNORMAL HIGH (ref 4.0–10.5)
nRBC: 0 % (ref 0.0–0.2)

## 2024-02-14 LAB — COMPREHENSIVE METABOLIC PANEL WITH GFR
ALT: 20 U/L (ref 0–44)
AST: 27 U/L (ref 15–41)
Albumin: 3.5 g/dL (ref 3.5–5.0)
Alkaline Phosphatase: 74 U/L (ref 38–126)
Anion gap: 11 (ref 5–15)
BUN: 39 mg/dL — ABNORMAL HIGH (ref 6–20)
CO2: 21 mmol/L — ABNORMAL LOW (ref 22–32)
Calcium: 9.4 mg/dL (ref 8.9–10.3)
Chloride: 108 mmol/L (ref 98–111)
Creatinine, Ser: 2.03 mg/dL — ABNORMAL HIGH (ref 0.44–1.00)
GFR, Estimated: 29 mL/min — ABNORMAL LOW (ref >60)
Glucose, Bld: 216 mg/dL — ABNORMAL HIGH (ref 70–99)
Potassium: 4.1 mmol/L (ref 3.5–5.1)
Sodium: 140 mmol/L (ref 135–145)
Total Bilirubin: 1.4 mg/dL — ABNORMAL HIGH (ref 0.0–1.2)
Total Protein: 6.9 g/dL (ref 6.5–8.1)

## 2024-02-14 LAB — URINALYSIS, ROUTINE W REFLEX MICROSCOPIC
Bilirubin Urine: NEGATIVE
Glucose, UA: 50 mg/dL — AB
Hgb urine dipstick: NEGATIVE
Ketones, ur: 5 mg/dL — AB
Leukocytes,Ua: NEGATIVE
Nitrite: NEGATIVE
Protein, ur: NEGATIVE mg/dL
Specific Gravity, Urine: 1.024 (ref 1.005–1.030)
pH: 5 (ref 5.0–8.0)

## 2024-02-14 LAB — RESP PANEL BY RT-PCR (RSV, FLU A&B, COVID)  RVPGX2
Influenza A by PCR: NEGATIVE
Influenza B by PCR: NEGATIVE
Resp Syncytial Virus by PCR: NEGATIVE
SARS Coronavirus 2 by RT PCR: NEGATIVE

## 2024-02-14 LAB — CBG MONITORING, ED: Glucose-Capillary: 238 mg/dL — ABNORMAL HIGH (ref 70–99)

## 2024-02-14 LAB — BLOOD GAS, VENOUS
Acid-base deficit: 5.2 mmol/L — ABNORMAL HIGH (ref 0.0–2.0)
Bicarbonate: 20 mmol/L (ref 20.0–28.0)
O2 Saturation: 42.1 %
Patient temperature: 37
pCO2, Ven: 37 mmHg — ABNORMAL LOW (ref 44–60)
pH, Ven: 7.34 (ref 7.25–7.43)
pO2, Ven: <31 mmHg — CL (ref 32–45)

## 2024-02-14 LAB — LIPASE, BLOOD: Lipase: 28 U/L (ref 11–51)

## 2024-02-14 LAB — TROPONIN I (HIGH SENSITIVITY): Troponin I (High Sensitivity): 857 ng/L (ref <18)

## 2024-02-14 LAB — RAPID URINE DRUG SCREEN, HOSP PERFORMED
Amphetamines: NOT DETECTED
Barbiturates: NOT DETECTED
Benzodiazepines: NOT DETECTED
Cocaine: NOT DETECTED
Opiates: POSITIVE — AB
Tetrahydrocannabinol: NOT DETECTED

## 2024-02-14 LAB — ETHANOL: Alcohol, Ethyl (B): <15 mg/dL (ref <15)

## 2024-02-14 LAB — I-STAT CG4 LACTIC ACID, ED: Lactic Acid, Venous: 2 mmol/L (ref 0.5–1.9)

## 2024-02-14 LAB — BRAIN NATRIURETIC PEPTIDE: B Natriuretic Peptide: 105.4 pg/mL — ABNORMAL HIGH (ref 0.0–100.0)

## 2024-02-14 MED ORDER — SODIUM CHLORIDE 0.9 % IV SOLN
2.0000 g | Freq: Once | INTRAVENOUS | Status: AC
  Administered 2024-02-14: 2 g via INTRAVENOUS
  Filled 2024-02-14: qty 12.5

## 2024-02-14 MED ORDER — BISACODYL 5 MG PO TBEC
5.0000 mg | DELAYED_RELEASE_TABLET | Freq: Every day | ORAL | Status: DC | PRN
  Administered 2024-02-19 (×2): 5 mg via ORAL
  Filled 2024-02-14 (×3): qty 1

## 2024-02-14 MED ORDER — PROCHLORPERAZINE EDISYLATE 10 MG/2ML IJ SOLN
10.0000 mg | Freq: Four times a day (QID) | INTRAMUSCULAR | Status: DC | PRN
  Administered 2024-02-17 – 2024-02-26 (×9): 10 mg via INTRAVENOUS
  Filled 2024-02-14 (×10): qty 2

## 2024-02-14 MED ORDER — INSULIN ASPART 100 UNIT/ML IJ SOLN
0.0000 [IU] | Freq: Three times a day (TID) | INTRAMUSCULAR | Status: DC
  Administered 2024-02-15: 2 [IU] via SUBCUTANEOUS
  Administered 2024-02-15: 3 [IU] via SUBCUTANEOUS
  Administered 2024-02-15: 2 [IU] via SUBCUTANEOUS
  Administered 2024-02-16: 5 [IU] via SUBCUTANEOUS
  Administered 2024-02-16 – 2024-02-17 (×3): 2 [IU] via SUBCUTANEOUS
  Administered 2024-02-17: 1 [IU] via SUBCUTANEOUS
  Administered 2024-02-17: 2 [IU] via SUBCUTANEOUS
  Administered 2024-02-18 (×3): 3 [IU] via SUBCUTANEOUS
  Administered 2024-02-19: 2 [IU] via SUBCUTANEOUS
  Administered 2024-02-19 (×2): 3 [IU] via SUBCUTANEOUS
  Administered 2024-02-20: 5 [IU] via SUBCUTANEOUS
  Filled 2024-02-14: qty 0.09

## 2024-02-14 MED ORDER — INSULIN ASPART 100 UNIT/ML IJ SOLN
0.0000 [IU] | Freq: Every day | INTRAMUSCULAR | Status: DC
  Administered 2024-02-17 – 2024-02-19 (×2): 2 [IU] via SUBCUTANEOUS
  Administered 2024-02-20: 3 [IU] via SUBCUTANEOUS
  Administered 2024-02-23: 2 [IU] via SUBCUTANEOUS
  Filled 2024-02-14: qty 0.05

## 2024-02-14 MED ORDER — NALOXONE HCL 0.4 MG/ML IJ SOLN: 0.4000 mg | Freq: Once | INTRAMUSCULAR | Status: DC

## 2024-02-14 MED ORDER — VANCOMYCIN HCL 2000 MG/400ML IV SOLN
2000.0000 mg | Freq: Once | INTRAVENOUS | Status: AC
  Administered 2024-02-14: 2000 mg via INTRAVENOUS
  Filled 2024-02-14: qty 400

## 2024-02-14 MED ORDER — NALOXONE HCL 4 MG/0.1ML NA LIQD
1.0000 | Freq: Once | NASAL | Status: AC
  Administered 2024-02-14: 1 via NASAL
  Filled 2024-02-14: qty 4

## 2024-02-14 MED ORDER — METRONIDAZOLE 500 MG/100ML IV SOLN
500.0000 mg | Freq: Two times a day (BID) | INTRAVENOUS | Status: DC
  Administered 2024-02-15 – 2024-02-19 (×10): 500 mg via INTRAVENOUS
  Filled 2024-02-14 (×10): qty 100

## 2024-02-14 MED ORDER — NALOXONE HCL 2 MG/2ML IJ SOSY
PREFILLED_SYRINGE | INTRAMUSCULAR | Status: AC
  Administered 2024-02-14: 2 mg
  Filled 2024-02-14: qty 2

## 2024-02-14 MED ORDER — ACETAMINOPHEN 650 MG RE SUPP: 650.0000 mg | Freq: Four times a day (QID) | RECTAL | Status: DC | PRN

## 2024-02-14 MED ORDER — SENNOSIDES-DOCUSATE SODIUM 8.6-50 MG PO TABS: 1.0000 | ORAL_TABLET | Freq: Every evening | ORAL | Status: DC | PRN

## 2024-02-14 MED ORDER — SODIUM CHLORIDE 0.9% FLUSH
3.0000 mL | Freq: Two times a day (BID) | INTRAVENOUS | Status: DC
  Administered 2024-02-15 – 2024-02-29 (×30): 3 mL via INTRAVENOUS

## 2024-02-14 MED ORDER — LACTATED RINGERS IV BOLUS (SEPSIS)
500.0000 mL | Freq: Once | INTRAVENOUS | Status: AC
  Administered 2024-02-15: 500 mL via INTRAVENOUS

## 2024-02-14 MED ORDER — LACTATED RINGERS IV SOLN: INTRAVENOUS | Status: AC

## 2024-02-14 MED ORDER — SODIUM CHLORIDE 0.9 % IV SOLN
2.0000 g | Freq: Two times a day (BID) | INTRAVENOUS | Status: DC
  Filled 2024-02-14: qty 12.5

## 2024-02-14 MED ORDER — VANCOMYCIN HCL IN DEXTROSE 1-5 GM/200ML-% IV SOLN: 1000.0000 mg | INTRAVENOUS | Status: DC

## 2024-02-14 MED ORDER — ACETAMINOPHEN 325 MG PO TABS
650.0000 mg | ORAL_TABLET | Freq: Four times a day (QID) | ORAL | Status: DC | PRN
  Administered 2024-02-20 – 2024-02-28 (×2): 650 mg via ORAL
  Filled 2024-02-14 (×2): qty 2

## 2024-02-14 NOTE — H&P (Incomplete)
 History and Physical    [name] FMW:[id number] DOB: 01-22-1971 DOA: 02/14/2024  PCP: [name], MD  Patient coming from: Home  I have personally briefly reviewed patient's old medical records in [facility] Link  Chief Complaint: Altered mental status  HPI: [name] is a 53 y.o. female with medical history significant for chronic HFpEF, history of VTE on Eliquis , insulin -dependent T2DM, SLE, Sjogren's syndrome, HTN, gastroparesis, hypothyroidism, anemia, chronic back pain, morbid obesity who presented to the ED for evaluation of altered mental status.  History is limited from patient otherwise supplemented by EDP and chart review.  Patient recently admitted at [facility] from 6/30-7/8 for intractable nausea and vomiting related to gastroparesis.  She had associated AKI.  She was managed with antiemetics with improvement in nausea vomiting.  She was also complaining of acute on chronic back pain.  Her Lyrica  was increased from 75 mg to 100 mg.  She was started on baclofen, Lidoderm  patch, and given short supply of oral Dilaudid .  [facility] on 7/11 for evaluation of exacerbation of chronic lower back pain.  She was given IV Dilaudid , IV Toradol , IV Robaxin .  Bladder scan did not show retention.  UA was negative for infection.  Her symptoms improved and she felt well enough to return home.  Patient's family and caregiver reported that she has not been doing well the last couple days.  They have noticed she has been more somnolent.  Patient lives alone.  When her caregiver arrived to her house around 4 PM she was very lethargic and difficult to arouse.  They were concerned she might of taken too many of her prescribed pain meds.  EMS were called and she was brought to the ED for further evaluation.  At time of admission, patient is more arousable and will answer questions briefly.  She does believe that her somnolence is related to  medication effect.  She reports chronic lower back pain but no other new pain.  She denies nausea, vomiting, dyspnea, chest pain, abdominal pain.  She denies dysuria or diarrhea.  She says she usually ambulates with a walker.  She denies any recent fall.  She is oriented to self and situation but not place or year.  ED Course  Labs/Imaging on admission: I have personally reviewed following labs and imaging studies.  Initial vitals showed BP 84/68, pulse 102, RR 21, temp 98.6 F, SpO2 98% on room air.  Labs showed WBC 22.5, hemoglobin 9.7, platelets 166, sodium 140, potassium 4.1, bicarb 21, BUN 39, creatinine 2.03, serum glucose 216, AST 27, ALT 20, alk phos 74, total bilirubin 1.4, lipase 28.  Troponin 857, BNP 105.4.  UA negative for UTI.  Blood and urine cultures in process.  Serum ethanol <15.  UDS positive for opiates.  Lactic acid 2.0.  SARS-CoV-2, influenza, RSV PCR negative.  CT head without contrast negative for acute abnormality.  Formal chest x-ray showed enlarged cardiac silhouette with vascular congestion, question of trace edema.  Films rotated to the left and underpenetrated.  Patient was given intranasal Narcan  with minimal response.  She was given Narcan  injection afterwards and received IV vancomycin  and cefepime .  EDP spoke with PCCM who felt patient was stable for stepdown.  EDP also spoke with on-call cardiology (Dr. Floretta) who recommended trending troponin and holding off on IV heparin .  The hospitalist service was consulted to admit.  Review of Systems: All systems reviewed and are negative except  as documented in history of present illness above.   Past Medical History:  Diagnosis Date  . Acute renal failure (HCC)     Intractable nausea vomiting secondary to diabetic gastroparesis causing dehydration and acute renal failure /notes 04/01/2013  . Anginal pain (HCC)   . Anxiety   . Bell's palsy 08/09/2010  . Bicornuate uterus   . CAP (community acquired  pneumonia) 10/2011   thelbert 11/08/2011  . Chest pain 01/13/2014  . CHF (congestive heart failure) (HCC)   . Diabetic gastroparesis associated with type 2 diabetes mellitus (HCC)    this is presumed diagnoses, not confirmed by any studies.   . Eczema   . Family history of breast cancer   . Family history of malignant neoplasm of breast   . Family history of malignant neoplasm of ovary   . Family history of ovarian cancer   . Family history of prostate cancer   . GERD (gastroesophageal reflux disease)   . High cholesterol   . Hypertension   . Liver lesion 08/13/2019  . Liver mass 2014   biopsied 03/2013 at [facility], not malignant.  is to undergo radiologic ablation of the mass in sept/October 2014.   . Lupus   . Migraines    maybe a couple times/yr (04/01/2013)  . Obesity   . Obstructive sleep apnea on CPAP 2011   Oxygen at nights  . Pulmonary embolism (HCC) 05/21/2021  . SJOGREN'S SYNDROME 08/09/2010  . SLE (systemic lupus erythematosus) (HCC)    thelbert 12/01/2010    Past Surgical History:  Procedure Laterality Date  . BREAST CYST EXCISION Left 08/2005   epidermoid  . CARDIAC CATHETERIZATION  02/07/2012  . ECTOPIC PREGNANCY SURGERY  1999  . ESOPHAGOGASTRODUODENOSCOPY N/A 02/26/2013   Procedure: ESOPHAGOGASTRODUODENOSCOPY (EGD);  Surgeon: Norleen LOISE Kiang, MD;  Location: [facility] ENDOSCOPY;  Service: Endoscopy;  Laterality: N/A;  . ESOPHAGOGASTRODUODENOSCOPY N/A 03/02/2015   Procedure: ESOPHAGOGASTRODUODENOSCOPY (EGD);  Surgeon: Toribio SHAUNNA Cedar, MD;  Location: [facility] ENDOSCOPY;  Service: Endoscopy;  Laterality: N/A;  . ESOPHAGOGASTRODUODENOSCOPY N/A 02/23/2021   Procedure: ESOPHAGOGASTRODUODENOSCOPY (EGD);  Surgeon: Dianna Specking, MD;  Location: [facility] ENDOSCOPY;  Service: Endoscopy;  Laterality: N/A;  possible dilation  . HERNIA REPAIR    . LEFT AND RIGHT HEART CATHETERIZATION WITH CORONARY ANGIOGRAM N/A 02/07/2012   Procedure: LEFT AND RIGHT HEART CATHETERIZATION WITH CORONARY  ANGIOGRAM;  Surgeon: Erick JONELLE Bergamo, MD;  Location: [facility] CATH LAB;  Service: Cardiovascular;  Laterality: N/A;  . LIVER BIOPSY  03/2013   liver mass/medical hx noted above  . MUSCLE BIOPSY     for lupus/notes 12/01/2010  . UMBILICAL HERNIA REPAIR  1980's    Social History: Social History   Tobacco Use  . Smoking status: Former    Current packs/day: 0.00    Average packs/day: 0.5 packs/day for 3.0 years (1.5 ttl pk-yrs)    Types: Cigarettes    Start date: 06/01/1988    Quit date: 06/02/1991    Years since quitting: 32.7  . Smokeless tobacco: Never  . Tobacco comments:    quit 28 yrs ago  Vaping Use  . Vaping status: Never Used  Substance Use Topics  . Alcohol  use: No    Alcohol /week: 0.0 standard drinks of alcohol   . Drug use: No    Allergies  Allergen Reactions  . Metoclopramide  Other (See Comments)    Developed restless leg, akathisia type limb movements.   . Codeine Itching and Rash  . Hydrocodone  Rash  . Percocet [Oxycodone -Acetaminophen ] Hives and Rash  Tolerates dilaudid  Tolerates acetaminophen      Family History  Problem Relation Age of Onset  . Diabetes Mother   . Asthma Mother   . Urticaria Mother   . Hypertension Mother   . Stroke Mother   . Ovarian cancer Sister 42       maternal half-sister; RAD51C positive  . Asthma Brother   . Asthma Brother   . Breast cancer Maternal Aunt 60       currently 89  . Cancer Maternal Aunt        unk. primary  . Cancer Maternal Aunt        unk. primary  . Ovarian cancer Maternal Aunt        deceased 11s  . Breast cancer Maternal Aunt        deceased 29s  . Breast cancer Maternal Aunt   . Stomach cancer Maternal Uncle        dx 41s; deceased  . Prostate cancer Maternal Uncle        deceased 2s  . Breast cancer Cousin        deceased 99s; daughter of mat uncle with stomach ca  . Hypertension Other   . Stroke Other   . Arthritis Other   . Allergic rhinitis Neg Hx   . Angioedema Neg Hx      Prior to  Admission medications   Medication Sig Start Date End Date Taking? Authorizing Provider  ADVAIR HFA 230-21 MCG/ACT inhaler Inhale 2 puffs into the lungs 2 (two) times daily. 12/20/20   [provider]  albuterol  (PROVENTIL ) (2.5 MG/3ML) 0.083% nebulizer solution Take 2.5 mg by nebulization as needed for wheezing.    [provider]  ALPRAZolam  (XANAX ) 0.5 MG tablet Take 0.5 mg by mouth as needed for anxiety or sleep. 04/19/17   [provider]  apixaban  (ELIQUIS ) 2.5 MG TABS tablet Take 1 tablet (2.5 mg total) by mouth 2 (two) times daily. 11/29/23   Ladona Heinz, MD  ARIPiprazole  (ABILIFY ) 5 MG tablet Take 5 mg by mouth daily. 01/14/21   [provider]  Azathioprine  75 MG TABS Take 1 tablet by mouth 2 (two) times daily. 01/19/23   [provider]  B-D UF III MINI PEN NEEDLES 31G X 5 MM MISC USE AS DIRECTED TWICE A DAY 10/31/18   Kassie Mallick, MD  buPROPion  (WELLBUTRIN  XL) 300 MG 24 hr tablet Take 300 mg by mouth daily. 03/14/22   [provider]  carvedilol  (COREG ) 25 MG tablet Take 1 tablet (25 mg total) by mouth 2 (two) times daily with a meal. 12/22/23   Ladona Heinz, MD  cephALEXin  (KEFLEX ) 500 MG capsule Take 1 capsule (500 mg total) by mouth 3 (three) times daily. 01/25/24   Horton, Charmaine FALCON, MD  Continuous Blood Gluc Sensor (FREESTYLE LIBRE 3 SENSOR) MISC 1 Device by Does not apply route every 14 (fourteen) days. Apply 1 sensor on upper arm every 14 days for continuous glucose monitoring 05/25/22   Von Pacific, MD  cromolyn  (OPTICROM ) 4 % ophthalmic solution Place 1 drop into both eyes 2 (two) times daily. 01/14/21   [provider]  cycloSPORINE  (RESTASIS ) 0.05 % ophthalmic emulsion Place 1 drop into both eyes in the morning, at noon, and at bedtime.     [provider]  diclofenac  Sodium (VOLTAREN ) 1 % GEL APPLY 2 GRAMS TO AFFECTED AREA 4 TIMES A DAY Patient taking differently: Apply 2 g topically 4 (four) times daily as needed  (pain). 01/11/22  Cantwell, Celeste C, PA-C  diphenhydrAMINE  (BENADRYL ) 50 MG tablet Take 0.5 tablets (25 mg total) by mouth every 6 (six) hours as needed for itching. 05/01/23   Verdene Purchase, MD  EASY TOUCH PEN NEEDLES 32G X 4 MM MISC USE TO INJECT INSULIN  TWICE DAILY 08/07/19   Kassie Mallick, MD  empagliflozin  (JARDIANCE ) 10 MG TABS tablet Take 1 tablet by mouth daily. 03/03/22   [provider]  famotidine  (PEPCID ) 20 MG tablet Take 20 mg by mouth 2 (two) times daily.    [provider]  fluticasone  (FLONASE ) 50 MCG/ACT nasal spray Place 2 sprays into both nostrils continuous as needed for allergies or rhinitis.    [provider]  glucose blood (ACCU-CHEK GUIDE) test strip Use as instructed 02/22/23   Von Pacific, MD  glucose blood (ONETOUCH ULTRA) test strip Test blood sugar 4 times a day 03/30/22   Von Pacific, MD  hydroxychloroquine  (PLAQUENIL ) 200 MG tablet Take 200 mg by mouth 2 (two) times daily.    [provider]  ibuprofen  (ADVIL ) 600 MG tablet Take 1 tablet (600 mg total) by mouth every 6 (six) hours as needed. Patient taking differently: Take 600 mg by mouth every 6 (six) hours as needed for mild pain (pain score 1-3). 10/14/22   Vicky Charleston, PA-C  insulin  regular human CONCENTRATED (HUMULIN  R U-500 KWIKPEN) 500 UNIT/ML KwikPen 100 UNITS, 30 MINUTES BEFORE [facility] MEAL 11/17/22   Von Pacific, MD  Insulin  Syringe-Needle U-100 (EASY TOUCH INSULIN  SYRINGE) 31G X 5/16 1 ML MISC USE AS DIRECTED THREE TIMES A DAY 10/12/18   Kassie Mallick, MD  isosorbide -hydrALAZINE  (BIDIL ) 20-37.5 MG tablet Take 1 tablet by mouth 3 (three) times daily.    [provider]  Lancets [facility] ULTRASOFT) lancets Test blood sugar 4 times a day 03/30/22   Von Pacific, MD  levothyroxine  (SYNTHROID ) 50 MCG tablet Take 1 tablet (50 mcg total) by mouth daily at 6 (six) AM. 08/30/19   Sheikh, Omair Latif, DO  meclizine  (ANTIVERT ) 12.5 MG tablet Take 1 tablet (12.5 mg total) by  mouth 3 (three) times daily as needed for dizziness. 05/03/23   Regalado, Belkys A, MD  megestrol  (MEGACE ) 40 MG tablet Take 40 mg by mouth daily. 01/30/15   [provider]  metFORMIN  (GLUCOPHAGE -XR) 500 MG 24 hr tablet Take 2 tablets (1,000 mg total) by mouth 2 (two) times daily. 02/02/21   Kassie Mallick, MD  metoCLOPramide  (REGLAN ) 10 MG tablet Take 1 tablet (10 mg total) by mouth 3 (three) times daily before meals. 05/01/23   Krishnan, Gokul, MD  Multiple Vitamin (MULTIVITAMIN WITH MINERALS) TABS tablet Take 1 tablet by mouth daily. 08/30/19   Sherrill Cable Latif, DO  ondansetron  (ZOFRAN -ODT) 4 MG disintegrating tablet Take 1 tablet (4 mg total) by mouth every 8 (eight) hours as needed for nausea or vomiting. 10/14/22   Vicky Charleston, PA-C  oxybutynin  (DITROPAN  XL) 15 MG 24 hr tablet Take 15 mg by mouth daily.    [provider]  pantoprazole  (PROTONIX ) 40 MG tablet Take 1 tablet (40 mg total) by mouth daily at 12 noon. 05/04/23   Regalado, Owen A, MD  pregabalin  (LYRICA ) 75 MG capsule Take 75 mg 3 (three) times daily by mouth.  01/28/15   [provider]  QUEtiapine  (SEROQUEL ) 300 MG tablet Take 300 mg by mouth at bedtime. 01/08/21   [provider]  rosuvastatin  (CRESTOR ) 20 MG tablet Take 1 tablet (20 mg total) by mouth daily. 06/09/21 10/16/22  Cantwell, Sherran BROCKS,  PA-C  sacubitril -valsartan  (ENTRESTO ) 97-103 MG TAKE 1 TABLET BY MOUTH TWICE A DAY 08/17/23   Ladona Heinz, MD  spironolactone  (ALDACTONE ) 50 MG tablet Take 1 tablet (50 mg total) by mouth daily. 12/22/23   Ladona Heinz, MD  sucralfate  (CARAFATE ) 1 GM/10ML suspension Take 10 mLs by mouth 2 (two) times daily as needed (abdomen pain). 12/09/20   [provider]  tiZANidine (ZANAFLEX) 4 MG capsule Take 4 mg by mouth 3 (three) times daily.    [provider]  topiramate  (TOPAMAX ) 25 MG tablet Take 75 mg by mouth at bedtime. 03/17/16   [provider]  torsemide  (DEMADEX ) 20 MG tablet Take  1 tablet (20 mg total) by mouth at bedtime. 03/06/23   Ladona Heinz, MD  traMADol  (ULTRAM ) 50 MG tablet Take 1 tablet (50 mg total) by mouth every 6 (six) hours as needed. 01/25/24   Doretha Folks, MD  TRANSDERM-SCOP 1 MG/3DAYS Place 1 patch (1.5 mg total) onto the skin every 3 (three) days. As needed for nausea 10/21/22   Clark, Meghan R, PA-C  Vitamin D, Ergocalciferol, (DRISDOL) 1.25 MG (50000 UNIT) CAPS capsule Take 50,000 Units by mouth once a week. 09/20/22   [provider]    Physical Exam: Vitals:   02/14/24 1847 02/14/24 2130 02/14/24 2137 02/14/24 2200  BP:  (!) 118/102 (!) 118/102 132/81  Pulse:  (!) 109 (!) 110 (!) 101  Resp:  19 18 17   Temp:   98.6 F (37 C)   TempSrc:   Oral   SpO2:  99% 97% 97%  Weight: (!) 150 kg     Height: 5' 9 (1.753 m)      Constitutional: Morbidly obese woman resting supine in bed.  Somnolent but arousing and answering questions briefly Eyes: EOMI, lids and conjunctivae normal ENMT: Mucous membranes are dry. Posterior pharynx clear of any exudate or lesions. Neck: normal, supple, no masses. Respiratory: clear to auscultation anteriorly. Normal respiratory effort. No accessory muscle use.  Cardiovascular: Regular rate and rhythm, no murmurs / rubs / gallops. No extremity edema. 2+ pedal pulses. Abdomen: no tenderness, no masses palpated. Musculoskeletal: no clubbing / cyanosis. No joint deformity upper and lower extremities.  Moving all extremities equally Skin: Purulent superficial wound near gluteal fold, erythematous fungal type irritation in the pannus area and upper thighs Neurologic: Sensation intact. Strength equal bilaterally Psychiatric: Somnolent, arousing to voice to briefly answer questions.  Oriented to self and situation but not year or place.  EKG: Personally reviewed.  Sinus rhythm, rate 99, nonspecific IVCD, low voltage.  Rate is faster when compared to previous.  Assessment/Plan Principal Problem:   Acute  encephalopathy Active Problems:   SIRS (systemic inflammatory response syndrome) (HCC)   Elevated troponin   AKI (acute kidney injury) (HCC)   Chronic heart failure with preserved ejection fraction (HFpEF) (HCC)   Morbid obesity (HCC)   Type 2 diabetes mellitus with hyperlipidemia (HCC)   Normocytic anemia   SLE (systemic lupus erythematosus related syndrome) (HCC)   Hypothyroidism   History of venous thromboembolism   Anxiety and depression   [name] is a 53 y.o. female with medical history significant for chronic HFpEF, history of VTE on Eliquis , insulin -dependent T2DM, SLE, Sjogren's syndrome, HTN, gastroparesis, hypothyroidism, anemia, chronic back pain, morbid obesity who is admitted with acute encephalopathy, elevated troponin, SIRS. *** Assessment and Plan: Acute encephalopathy: Hypersomnolent and unarousable on arrival.  Now more responsive but still not back to baseline.  Highly suspect polypharmacy contributing plus potential  infectious process.  Recently prescribed short course of Dilaudid , baclofen, and increased Lyrica  dose after hospitalized at [facility].  UDS positive for opiates.  CT head negative for acute changes. - Holding Dilaudid , baclofen, Lyrica , Seroquel  - Follow blood and urine cultures - Continue gentle IV fluid hydration overnight  SIRS: Patient presenting with leukocytosis, tachycardia, AKI.  No clear primary infectious source other than buttocks wound with some purulent discharge.  UA negative for UTI.  CXR limited but without obvious pneumonia.  Blood pressure stable without shock appearance. - 500 LR bolus followed by gentle maintenance fluids overnight - Continue broad-spectrum empiric antibiotics with vancomycin , cefepime , Flagyl  - Follow blood and urine cultures - Consult to wound care for buttocks wound  Elevated troponin: Initial troponin 857.  EKG without any significant ischemic changes and is otherwise similar to previous other than  increased heart rate.  Patient is denying chest pain. - Follow repeat troponin - Keep on telemetry - Obtain echocardiogram  Acute kidney injury: Creatinine 2.03 on admission.  Recent baseline seems closer to 1.5. - Obtain renal ultrasound - Continue IV fluids as above - Monitor UOP, bladder scan as needed - Holding spironolactone , metformin , Jardiance , Entresto   Chronic HFpEF: Last EF 55-60% by TTE 01/04/2023.  Morbidly obese body habitus limits volume assessment however overall does not appear significantly volume overloaded.  Mucous membranes are very dry. - Receiving IV fluids as above - Holding spironolactone , Jardiance , Entresto   History of DVT: Continue Eliquis .  Insulin -dependent type 2 diabetes: Holding metformin  and Jardiance .  Placed on SSI.  Chronic normocytic anemia: Hemoglobin relatively stable.  Continue monitor.  Hypothyroidism: ***  Chronic back pain: ***  SLE/Sjogren's syndrome: ***  Mood disorder: ***   DVT prophylaxis: ***  Code Status:   Code Status: Full Code  Family Communication: ***  Disposition Plan: ***  Consults called: None  Severity of Illness: The appropriate patient status for this patient is INPATIENT. Inpatient status is judged to be reasonable and necessary in order to provide the required intensity of service to ensure the patient's safety. The patient's presenting symptoms, physical exam findings, and initial radiographic and laboratory data in the context of their chronic comorbidities is felt to place them at high risk for further clinical deterioration. Furthermore, it is not anticipated that the patient will be medically stable for discharge from the hospital within 2 midnights of admission.   * I certify that at the point of admission it is my clinical judgment that the patient will require inpatient hospital care spanning beyond 2 midnights from the point of admission due to high intensity of service, high risk for further  deterioration and high frequency of surveillance required.DEWAINE Jorie Blanch MD [facility]  If 7PM-7AM, please contact night-coverage [email or web address]  02/14/2024, 11:51 PM

## 2024-02-14 NOTE — ED Notes (Signed)
 IV team unable to get obtain IV access

## 2024-02-14 NOTE — Hospital Course (Signed)
 [name] is a 53 y.o. female with medical history significant for chronic HFpEF, history of VTE on Eliquis , insulin -dependent T2DM, SLE, Sjogren's syndrome, HTN, gastroparesis, hypothyroidism, anemia, chronic back pain, morbid obesity who is admitted with acute encephalopathy, elevated troponin, SIRS.

## 2024-02-14 NOTE — Progress Notes (Signed)
 Unable to find suitable veins for PIV placement, assessed w/ Ultrasound, RN made aware.to notify MD.

## 2024-02-14 NOTE — ED Notes (Signed)
 [place] PIV placed.

## 2024-02-14 NOTE — ED Notes (Signed)
 RN gave Narcan  with DO at bedside another RN to get USIV

## 2024-02-14 NOTE — H&P (Signed)
 History and Physical    [name] FMW:[id number] DOB: May 10, 1971 DOA: 02/14/2024  PCP: [name], MD  Patient coming from: Home  I have personally briefly reviewed patient's old medical records in [facility]  Chief Complaint: Altered mental status  HPI: [name] is a 53 y.o. female with medical history significant for chronic HFpEF, history of VTE on Eliquis , insulin -dependent T2DM, SLE, Sjogren's syndrome, HTN, gastroparesis, hypothyroidism, anemia, chronic back pain, morbid obesity who presented to the ED for evaluation of altered mental status.  History is limited from patient otherwise supplemented by EDP and chart review.  Patient recently admitted at [facility] from 6/30-7/8 for intractable nausea and vomiting related to gastroparesis.  She had associated AKI.  She was managed with antiemetics with improvement in nausea vomiting.  She was also complaining of acute on chronic back pain.  Her Lyrica  was increased from 75 mg to 100 mg.  She was started on baclofen, Lidoderm  patch, and given short supply of oral Dilaudid .  Patient seen again in the [facility], ED on 7/11 for evaluation of exacerbation of chronic lower back pain.  She was given IV Dilaudid , IV Toradol , IV Robaxin .  Bladder scan did not show retention.  UA was negative for infection.  Her symptoms improved and she felt well enough to return home.  Patient's family and caregiver reported that she has not been doing well the last couple days.  They have noticed she has been more somnolent.  Patient lives alone.  When her caregiver arrived to her house around 4 PM she was very lethargic and difficult to arouse.  They were concerned she might of taken too many of her prescribed pain meds.  EMS were called and she was brought to the ED for further evaluation.  At time of admission, patient is more arousable and will answer questions briefly.  She does believe that her somnolence is related to  medication effect.  She reports chronic lower back pain but no other new pain.  She denies nausea, vomiting, dyspnea, chest pain, abdominal pain.  She denies dysuria or diarrhea.  She says she usually ambulates with a walker.  She denies any recent fall.  She is oriented to self and situation but not place or year.  ED Course  Labs/Imaging on admission: I have personally reviewed following labs and imaging studies.  Initial vitals showed BP 84/68, pulse 102, RR 21, temp 98.6 F, SpO2 98% on room air.  Labs showed WBC 22.5, hemoglobin 9.7, platelets 166, sodium 140, potassium 4.1, bicarb 21, BUN 39, creatinine 2.03, serum glucose 216, AST 27, ALT 20, alk phos 74, total bilirubin 1.4, lipase 28.  Troponin 857, BNP 105.4.  UA negative for UTI.  Blood and urine cultures in process.  Serum ethanol <15.  UDS positive for opiates.  Lactic acid 2.0.  SARS-CoV-2, influenza, RSV PCR negative.  CT head without contrast negative for acute abnormality.  Formal chest x-ray showed enlarged cardiac silhouette with vascular congestion, question of trace edema.  Films rotated to the left and underpenetrated.  Patient was given intranasal Narcan  with minimal response.  She was given Narcan  injection afterwards and received IV vancomycin  and cefepime .  EDP spoke with PCCM who felt patient was stable for stepdown.  EDP also spoke with on-call cardiology (Dr. Floretta) who recommended trending troponin and holding off on IV heparin .  The hospitalist service was consulted to admit.  Review of Systems: All systems reviewed and are negative except  as documented in history of present illness above.   Past Medical History:  Diagnosis Date   Acute renal failure (HCC)     Intractable nausea vomiting secondary to diabetic gastroparesis causing dehydration and acute renal failure /notes 04/01/2013   Anginal pain (HCC)    Anxiety    Bell's palsy 08/09/2010   Bicornuate uterus    CAP (community acquired pneumonia)  10/2011   thelbert 11/08/2011   Chest pain 01/13/2014   CHF (congestive heart failure) (HCC)    Diabetic gastroparesis associated with type 2 diabetes mellitus (HCC)    this is presumed diagnoses, not confirmed by any studies.    Eczema    Family history of breast cancer    Family history of malignant neoplasm of breast    Family history of malignant neoplasm of ovary    Family history of ovarian cancer    Family history of prostate cancer    GERD (gastroesophageal reflux disease)    High cholesterol    Hypertension    Liver lesion 08/13/2019   Liver mass 2014   biopsied 03/2013 at [facility], not malignant.  is to undergo radiologic ablation of the mass in sept/October 2014.    Lupus    Migraines    maybe a couple times/yr (04/01/2013)   Obesity    Obstructive sleep apnea on CPAP 2011   Oxygen at nights   Pulmonary embolism (HCC) 05/21/2021   SJOGREN'S SYNDROME 08/09/2010   SLE (systemic lupus erythematosus) (HCC)    thelbert 12/01/2010    Past Surgical History:  Procedure Laterality Date   BREAST CYST EXCISION Left 08/2005   epidermoid   CARDIAC CATHETERIZATION  02/07/2012   ECTOPIC PREGNANCY SURGERY  1999   ESOPHAGOGASTRODUODENOSCOPY N/A 02/26/2013   Procedure: ESOPHAGOGASTRODUODENOSCOPY (EGD);  Surgeon: Norleen LOISE Kiang, MD;  Location: [facility] ENDOSCOPY;  Service: Endoscopy;  Laterality: N/A;   ESOPHAGOGASTRODUODENOSCOPY N/A 03/02/2015   Procedure: ESOPHAGOGASTRODUODENOSCOPY (EGD);  Surgeon: Toribio SHAUNNA Cedar, MD;  Location: [facility] ENDOSCOPY;  Service: Endoscopy;  Laterality: N/A;   ESOPHAGOGASTRODUODENOSCOPY N/A 02/23/2021   Procedure: ESOPHAGOGASTRODUODENOSCOPY (EGD);  Surgeon: Dianna Specking, MD;  Location: [facility] ENDOSCOPY;  Service: Endoscopy;  Laterality: N/A;  possible dilation   HERNIA REPAIR     LEFT AND RIGHT HEART CATHETERIZATION WITH CORONARY ANGIOGRAM N/A 02/07/2012   Procedure: LEFT AND RIGHT HEART CATHETERIZATION WITH CORONARY ANGIOGRAM;  Surgeon: Erick JONELLE Bergamo, MD;   Location[facility] CATH LAB;  Service: Cardiovascular;  Laterality: N/A;   LIVER BIOPSY  03/2013   liver mass/medical hx noted above   MUSCLE BIOPSY     for lupus/notes 12/01/2010   UMBILICAL HERNIA REPAIR  1980's    Social History: Social History   Tobacco Use   Smoking status: Former    Current packs/day: 0.00    Average packs/day: 0.5 packs/day for 3.0 years (1.5 ttl pk-yrs)    Types: Cigarettes    Start date: 06/01/1988    Quit date: 06/02/1991    Years since quitting: 32.7   Smokeless tobacco: Never   Tobacco comments:    quit 28 yrs ago  Vaping Use   Vaping status: Never Used  Substance Use Topics   Alcohol  use: No    Alcohol /week: 0.0 standard drinks of alcohol    Drug use: No    Allergies  Allergen Reactions   Metoclopramide  Other (See Comments)    Developed restless leg, akathisia type limb movements.    Codeine Itching and Rash   Hydrocodone  Rash   Percocet [Oxycodone -Acetaminophen ] Hives and Rash  Tolerates dilaudid  Tolerates acetaminophen      Family History  Problem Relation Age of Onset   Diabetes Mother    Asthma Mother    Urticaria Mother    Hypertension Mother    Stroke Mother    Ovarian cancer Sister 61       maternal half-sister; RAD51C positive   Asthma Brother    Asthma Brother    Breast cancer Maternal Aunt 63       currently 62   Cancer Maternal Aunt        unk. primary   Cancer Maternal Aunt        unk. primary   Ovarian cancer Maternal Aunt        deceased 30s   Breast cancer Maternal Aunt        deceased 97s   Breast cancer Maternal Aunt    Stomach cancer Maternal Uncle        dx 23s; deceased   Prostate cancer Maternal Uncle        deceased 45s   Breast cancer Cousin        deceased 23s; daughter of mat uncle with stomach ca   Hypertension Other    Stroke Other    Arthritis Other    Allergic rhinitis Neg Hx    Angioedema Neg Hx      Prior to Admission medications   Medication Sig Start Date End Date Taking? Authorizing  Provider  ADVAIR HFA 230-21 MCG/ACT inhaler Inhale 2 puffs into the lungs 2 (two) times daily. 12/20/20   [provider]  albuterol  (PROVENTIL ) (2.5 MG/3ML) 0.083% nebulizer solution Take 2.5 mg by nebulization as needed for wheezing.    [provider]  ALPRAZolam  (XANAX ) 0.5 MG tablet Take 0.5 mg by mouth as needed for anxiety or sleep. 04/19/17   [provider]  apixaban  (ELIQUIS ) 2.5 MG TABS tablet Take 1 tablet (2.5 mg total) by mouth 2 (two) times daily. 11/29/23   Ladona Heinz, MD  ARIPiprazole  (ABILIFY ) 5 MG tablet Take 5 mg by mouth daily. 01/14/21   [provider]  Azathioprine  75 MG TABS Take 1 tablet by mouth 2 (two) times daily. 01/19/23   [provider]  B-D UF III MINI PEN NEEDLES 31G X 5 MM MISC USE AS DIRECTED TWICE A DAY 10/31/18   Kassie Mallick, MD  buPROPion  (WELLBUTRIN  XL) 300 MG 24 hr tablet Take 300 mg by mouth daily. 03/14/22   [provider]  carvedilol  (COREG ) 25 MG tablet Take 1 tablet (25 mg total) by mouth 2 (two) times daily with a meal. 12/22/23   Ladona Heinz, MD  cephALEXin  (KEFLEX ) 500 MG capsule Take 1 capsule (500 mg total) by mouth 3 (three) times daily. 01/25/24   Horton, Charmaine FALCON, MD  Continuous Blood Gluc Sensor (FREESTYLE LIBRE 3 SENSOR) MISC 1 Device by Does not apply route every 14 (fourteen) days. Apply 1 sensor on upper arm every 14 days for continuous glucose monitoring 05/25/22   Von Pacific, MD  cromolyn  (OPTICROM ) 4 % ophthalmic solution Place 1 drop into both eyes 2 (two) times daily. 01/14/21   [provider]  cycloSPORINE  (RESTASIS ) 0.05 % ophthalmic emulsion Place 1 drop into both eyes in the morning, at noon, and at bedtime.     [provider]  diclofenac  Sodium (VOLTAREN ) 1 % GEL APPLY 2 GRAMS TO AFFECTED AREA 4 TIMES A DAY Patient taking differently: Apply 2 g topically 4 (four) times daily as needed (pain). 01/11/22  Cantwell, Celeste C, PA-C  diphenhydrAMINE  (BENADRYL ) 50 MG  tablet Take 0.5 tablets (25 mg total) by mouth every 6 (six) hours as needed for itching. 05/01/23   Verdene Purchase, MD  EASY TOUCH PEN NEEDLES 32G X 4 MM MISC USE TO INJECT INSULIN  TWICE DAILY 08/07/19   Kassie Mallick, MD  empagliflozin  (JARDIANCE ) 10 MG TABS tablet Take 1 tablet by mouth daily. 03/03/22   [provider]  famotidine  (PEPCID ) 20 MG tablet Take 20 mg by mouth 2 (two) times daily.    [provider]  fluticasone  (FLONASE ) 50 MCG/ACT nasal spray Place 2 sprays into both nostrils continuous as needed for allergies or rhinitis.    [provider]  glucose blood (ACCU-CHEK GUIDE) test strip Use as instructed 02/22/23   Von Pacific, MD  glucose blood (ONETOUCH ULTRA) test strip Test blood sugar 4 times a day 03/30/22   Von Pacific, MD  hydroxychloroquine  (PLAQUENIL ) 200 MG tablet Take 200 mg by mouth 2 (two) times daily.    [provider]  ibuprofen  (ADVIL ) 600 MG tablet Take 1 tablet (600 mg total) by mouth every 6 (six) hours as needed. Patient taking differently: Take 600 mg by mouth every 6 (six) hours as needed for mild pain (pain score 1-3). 10/14/22   Vicky Charleston, PA-C  insulin  regular human CONCENTRATED (HUMULIN  R U-500 KWIKPEN) 500 UNIT/ML KwikPen 100 UNITS, 30 MINUTES BEFORE [facility] MEAL 11/17/22   Von Pacific, MD  Insulin  Syringe-Needle U-100 (EASY TOUCH INSULIN  SYRINGE) 31G X 5/16 1 ML MISC USE AS DIRECTED THREE TIMES A DAY 10/12/18   Kassie Mallick, MD  isosorbide -hydrALAZINE  (BIDIL ) 20-37.5 MG tablet Take 1 tablet by mouth 3 (three) times daily.    [provider]  Lancets [facility] ULTRASOFT) lancets Test blood sugar 4 times a day 03/30/22   Von Pacific, MD  levothyroxine  (SYNTHROID ) 50 MCG tablet Take 1 tablet (50 mcg total) by mouth daily at 6 (six) AM. 08/30/19   Sheikh, Alejandro Latif, DO  meclizine  (ANTIVERT ) 12.5 MG tablet Take 1 tablet (12.5 mg total) by mouth 3 (three) times daily as needed for dizziness. 05/03/23   Regalado, Belkys A,  MD  megestrol  (MEGACE ) 40 MG tablet Take 40 mg by mouth daily. 01/30/15   [provider]  metFORMIN  (GLUCOPHAGE -XR) 500 MG 24 hr tablet Take 2 tablets (1,000 mg total) by mouth 2 (two) times daily. 02/02/21   Kassie Mallick, MD  metoCLOPramide  (REGLAN ) 10 MG tablet Take 1 tablet (10 mg total) by mouth 3 (three) times daily before meals. 05/01/23   Krishnan, Gokul, MD  Multiple Vitamin (MULTIVITAMIN WITH MINERALS) TABS tablet Take 1 tablet by mouth daily. 08/30/19   Sherrill Alejandro Latif, DO  ondansetron  (ZOFRAN -ODT) 4 MG disintegrating tablet Take 1 tablet (4 mg total) by mouth every 8 (eight) hours as needed for nausea or vomiting. 10/14/22   Vicky Charleston, PA-C  oxybutynin  (DITROPAN  XL) 15 MG 24 hr tablet Take 15 mg by mouth daily.    [provider]  pantoprazole  (PROTONIX ) 40 MG tablet Take 1 tablet (40 mg total) by mouth daily at 12 noon. 05/04/23   Regalado, Belkys A, MD  pregabalin  (LYRICA ) 75 MG capsule Take 75 mg 3 (three) times daily by mouth.  01/28/15   [provider]  QUEtiapine  (SEROQUEL ) 300 MG tablet Take 300 mg by mouth at bedtime. 01/08/21   [provider]  rosuvastatin  (CRESTOR ) 20 MG tablet Take 1 tablet (20 mg total) by mouth daily. 06/09/21 10/16/22  Thomasena Cantor  C, PA-C  sacubitril -valsartan  (ENTRESTO ) 97-103 MG TAKE 1 TABLET BY MOUTH TWICE A DAY 08/17/23   Ladona Heinz, MD  spironolactone  (ALDACTONE ) 50 MG tablet Take 1 tablet (50 mg total) by mouth daily. 12/22/23   Ladona Heinz, MD  sucralfate  (CARAFATE ) 1 GM/10ML suspension Take 10 mLs by mouth 2 (two) times daily as needed (abdomen pain). 12/09/20   [provider]  tiZANidine (ZANAFLEX) 4 MG capsule Take 4 mg by mouth 3 (three) times daily.    [provider]  topiramate  (TOPAMAX ) 25 MG tablet Take 75 mg by mouth at bedtime. 03/17/16   [provider]  torsemide  (DEMADEX ) 20 MG tablet Take 1 tablet (20 mg total) by mouth at bedtime. 03/06/23   Ladona Heinz, MD  traMADol   (ULTRAM ) 50 MG tablet Take 1 tablet (50 mg total) by mouth every 6 (six) hours as needed. 01/25/24   Doretha Folks, MD  TRANSDERM-SCOP 1 MG/3DAYS Place 1 patch (1.5 mg total) onto the skin every 3 (three) days. As needed for nausea 10/21/22   Clark, Meghan R, PA-C  Vitamin D, Ergocalciferol, (DRISDOL) 1.25 MG (50000 UNIT) CAPS capsule Take 50,000 Units by mouth once a week. 09/20/22   [provider]    Physical Exam: Vitals:   02/14/24 1847 02/14/24 2130 02/14/24 2137 02/14/24 2200  BP:  (!) 118/102 (!) 118/102 132/81  Pulse:  (!) 109 (!) 110 (!) 101  Resp:  19 18 17   Temp:   98.6 F (37 C)   TempSrc:   Oral   SpO2:  99% 97% 97%  Weight: (!) 150 kg     Height: 5' 9 (1.753 m)      Constitutional: Morbidly obese woman resting supine in bed.  Somnolent but arousing and answering questions briefly Eyes: EOMI, lids and conjunctivae normal ENMT: Mucous membranes are dry. Posterior pharynx clear of any exudate or lesions. Neck: normal, supple, no masses. Respiratory: clear to auscultation anteriorly. Normal respiratory effort. No accessory muscle use.  Cardiovascular: Regular rate and rhythm, no murmurs / rubs / gallops. No extremity edema. 2+ pedal pulses. Abdomen: no tenderness, no masses palpated. Musculoskeletal: no clubbing / cyanosis. No joint deformity upper and lower extremities.  Moving all extremities equally Skin: Purulent superficial wound near gluteal fold, erythematous fungal type irritation in the pannus area and upper thighs Neurologic: Sensation intact. Strength equal bilaterally Psychiatric: Somnolent, arousing to voice to briefly answer questions.  Oriented to self and situation but not year or place.  EKG: Personally reviewed. ***  Assessment/Plan Principal Problem:   Acute encephalopathy Active Problems:   SIRS (systemic inflammatory response syndrome) (HCC)   Elevated troponin   AKI (acute kidney injury) (HCC)   Chronic heart failure with preserved  ejection fraction (HFpEF) (HCC)   Morbid obesity (HCC)   Type 2 diabetes mellitus with hyperlipidemia (HCC)   Normocytic anemia   SLE (systemic lupus erythematosus related syndrome) (HCC)   Hypothyroidism   History of venous thromboembolism   Anxiety and depression   *** No notes on file *** Assessment and Plan: Acute encephalopathy: ***  SIRS: ***  Elevated troponin: ***  Acute kidney injury: ***  Chronic HFpEF: ***  History of DVT: ***  Insulin -dependent type 2 diabetes: ***  Chronic normocytic anemia: ***  Hypothyroidism: ***  Chronic back pain: ***  SLE/Sjogren's syndrome: ***  Mood disorder: ***   DVT prophylaxis: ***  Code Status:   Code Status: Full Code  Family Communication: ***  Disposition Plan: ***  Consults called: None  Severity of Illness: The appropriate patient status for this patient is INPATIENT. Inpatient status is judged to be reasonable and necessary in order to provide the required intensity of service to ensure the patient's safety. The patient's presenting symptoms, physical exam findings, and initial radiographic and laboratory data in the context of their chronic comorbidities is felt to place them at high risk for further clinical deterioration. Furthermore, it is not anticipated that the patient will be medically stable for discharge from the hospital within 2 midnights of admission.   * I certify that at the point of admission it is my clinical judgment that the patient will require inpatient hospital care spanning beyond 2 midnights from the point of admission due to high intensity of service, high risk for further deterioration and high frequency of surveillance required.DEWAINE Jorie Blanch MD [facility]  If 7PM-7AM, please contact night-coverage [email or web address]  02/14/2024, 11:45 PM

## 2024-02-14 NOTE — ED Triage Notes (Signed)
 Patient to ED by EMS from home for increased fatigue. Per EMS patient family thinks she may have taken too many of her prescribed pain meds. EMS states patient was more alert and walking when they arrived upon her arrival here she only responds to pain. She has HX of diabetes and family mentioned wounds on patient bottom.

## 2024-02-14 NOTE — ED Provider Notes (Signed)
 [place] EMERGENCY DEPARTMENT AT [facility] Provider Note   CSN: [id number] Arrival date & time: 02/14/24  1806     Patient presents with: Fatigue   [name] is a 53 y.o. female.   Level 5 caveat due to altered mental status.  History of Sjogren's disease hypertension high cholesterol pulmonary embolism on anticoagulation, lupus, diabetes.  I talked on the phone with the patient's sister and caregiver.  Sounds like she has been weak and not doing well for the last couple days.  She was seen in the emergency department a couple days ago and given some medications for back pain.  They have noticed her being more sleepy.  She was more unresponsive/sleepy and lethargic today when caregiver got there 4:00.  Last known normal sounds like several days ago.  I am able to sternal rub her and get her awake to tell me her name she denies any pain.  She can tell me the name of her caregiver.  But she falls back to sleep.  The history is provided by the patient and a caregiver.       Prior to Admission medications   Medication Sig Start Date End Date Taking? Authorizing Provider  ADVAIR HFA 230-21 MCG/ACT inhaler Inhale 2 puffs into the lungs 2 (two) times daily. 12/20/20   [provider]  albuterol  (PROVENTIL ) (2.5 MG/3ML) 0.083% nebulizer solution Take 2.5 mg by nebulization as needed for wheezing.    [provider]  ALPRAZolam  (XANAX ) 0.5 MG tablet Take 0.5 mg by mouth as needed for anxiety or sleep. 04/19/17   [provider]  apixaban  (ELIQUIS ) 2.5 MG TABS tablet Take 1 tablet (2.5 mg total) by mouth 2 (two) times daily. 11/29/23   Ladona Heinz, MD  ARIPiprazole  (ABILIFY ) 5 MG tablet Take 5 mg by mouth daily. 01/14/21   [provider]  Azathioprine  75 MG TABS Take 1 tablet by mouth 2 (two) times daily. 01/19/23   [provider]  B-D UF III MINI PEN NEEDLES 31G X 5 MM MISC USE AS DIRECTED TWICE A DAY 10/31/18   Kassie Mallick, MD   buPROPion  (WELLBUTRIN  XL) 300 MG 24 hr tablet Take 300 mg by mouth daily. 03/14/22   [provider]  carvedilol  (COREG ) 25 MG tablet Take 1 tablet (25 mg total) by mouth 2 (two) times daily with a meal. 12/22/23   Ladona Heinz, MD  cephALEXin  (KEFLEX ) 500 MG capsule Take 1 capsule (500 mg total) by mouth 3 (three) times daily. 01/25/24   Horton, Charmaine FALCON, MD  Continuous Blood Gluc Sensor (FREESTYLE LIBRE 3 SENSOR) MISC 1 Device by Does not apply route every 14 (fourteen) days. Apply 1 sensor on upper arm every 14 days for continuous glucose monitoring 05/25/22   Von Pacific, MD  cromolyn  (OPTICROM ) 4 % ophthalmic solution Place 1 drop into both eyes 2 (two) times daily. 01/14/21   [provider]  cycloSPORINE  (RESTASIS ) 0.05 % ophthalmic emulsion Place 1 drop into both eyes in the morning, at noon, and at bedtime.     [provider]  diclofenac  Sodium (VOLTAREN ) 1 % GEL APPLY 2 GRAMS TO AFFECTED AREA 4 TIMES A DAY Patient taking differently: Apply 2 g topically 4 (four) times daily as needed (pain). 01/11/22   Cantwell, Celeste C, PA-C  diphenhydrAMINE  (BENADRYL ) 50 MG tablet Take 0.5 tablets (25 mg total) by mouth every 6 (six) hours as needed for itching. 05/01/23   Krishnan, Gokul, MD  EASY [facility] PEN  NEEDLES 32G X 4 MM MISC USE TO INJECT INSULIN  TWICE DAILY 08/07/19   Kassie Mallick, MD  empagliflozin  (JARDIANCE ) 10 MG TABS tablet Take 1 tablet by mouth daily. 03/03/22   [provider]  famotidine  (PEPCID ) 20 MG tablet Take 20 mg by mouth 2 (two) times daily.    [provider]  fluticasone  (FLONASE ) 50 MCG/ACT nasal spray Place 2 sprays into both nostrils continuous as needed for allergies or rhinitis.    [provider]  glucose blood (ACCU-CHEK GUIDE) test strip Use as instructed 02/22/23   Von Pacific, MD  glucose blood (ONETOUCH ULTRA) test strip Test blood sugar 4 times a day 03/30/22   Von Pacific, MD  hydroxychloroquine  (PLAQUENIL ) 200 MG  tablet Take 200 mg by mouth 2 (two) times daily.    [provider]  ibuprofen  (ADVIL ) 600 MG tablet Take 1 tablet (600 mg total) by mouth every 6 (six) hours as needed. Patient taking differently: Take 600 mg by mouth every 6 (six) hours as needed for mild pain (pain score 1-3). 10/14/22   Vicky Charleston, PA-C  insulin  regular human CONCENTRATED (HUMULIN  R U-500 KWIKPEN) 500 UNIT/ML KwikPen 100 UNITS, 30 MINUTES BEFORE [facility] MEAL 11/17/22   Von Pacific, MD  Insulin  Syringe-Needle U-100 (EASY TOUCH INSULIN  SYRINGE) 31G X 5/16 1 ML MISC USE AS DIRECTED THREE TIMES A DAY 10/12/18   Kassie Mallick, MD  isosorbide -hydrALAZINE  (BIDIL ) 20-37.5 MG tablet Take 1 tablet by mouth 3 (three) times daily.    [provider]  Lancets [facility] ULTRASOFT) lancets Test blood sugar 4 times a day 03/30/22   Von Pacific, MD  levothyroxine  (SYNTHROID ) 50 MCG tablet Take 1 tablet (50 mcg total) by mouth daily at 6 (six) AM. 08/30/19   Sherrill Cable Latif, DO  meclizine  (ANTIVERT ) 12.5 MG tablet Take 1 tablet (12.5 mg total) by mouth 3 (three) times daily as needed for dizziness. 05/03/23   Regalado, Belkys A, MD  megestrol  (MEGACE ) 40 MG tablet Take 40 mg by mouth daily. 01/30/15   [provider]  metFORMIN  (GLUCOPHAGE -XR) 500 MG 24 hr tablet Take 2 tablets (1,000 mg total) by mouth 2 (two) times daily. 02/02/21   Kassie Mallick, MD  metoCLOPramide  (REGLAN ) 10 MG tablet Take 1 tablet (10 mg total) by mouth 3 (three) times daily before meals. 05/01/23   Krishnan, Gokul, MD  Multiple Vitamin (MULTIVITAMIN WITH MINERALS) TABS tablet Take 1 tablet by mouth daily. 08/30/19   Sherrill Cable Latif, DO  ondansetron  (ZOFRAN -ODT) 4 MG disintegrating tablet Take 1 tablet (4 mg total) by mouth every 8 (eight) hours as needed for nausea or vomiting. 10/14/22   Vicky Charleston, PA-C  oxybutynin  (DITROPAN  XL) 15 MG 24 hr tablet Take 15 mg by mouth daily.    [provider]  pantoprazole  (PROTONIX ) 40 MG tablet  Take 1 tablet (40 mg total) by mouth daily at 12 noon. 05/04/23   Regalado, Owen A, MD  pregabalin  (LYRICA ) 75 MG capsule Take 75 mg 3 (three) times daily by mouth.  01/28/15   [provider]  QUEtiapine  (SEROQUEL ) 300 MG tablet Take 300 mg by mouth at bedtime. 01/08/21   [provider]  rosuvastatin  (CRESTOR ) 20 MG tablet Take 1 tablet (20 mg total) by mouth daily. 06/09/21 10/16/22  Cantwell, Celeste C, PA-C  sacubitril -valsartan  (ENTRESTO ) 97-103 MG TAKE 1 TABLET BY MOUTH TWICE A DAY 08/17/23   Ladona Heinz, MD  spironolactone  (ALDACTONE ) 50 MG tablet Take 1 tablet (50 mg total) by mouth daily.  12/22/23   Ladona Heinz, MD  sucralfate  (CARAFATE ) 1 GM/10ML suspension Take 10 mLs by mouth 2 (two) times daily as needed (abdomen pain). 12/09/20   [provider]  tiZANidine (ZANAFLEX) 4 MG capsule Take 4 mg by mouth 3 (three) times daily.    [provider]  topiramate  (TOPAMAX ) 25 MG tablet Take 75 mg by mouth at bedtime. 03/17/16   [provider]  torsemide  (DEMADEX ) 20 MG tablet Take 1 tablet (20 mg total) by mouth at bedtime. 03/06/23   Ladona Heinz, MD  traMADol  (ULTRAM ) 50 MG tablet Take 1 tablet (50 mg total) by mouth every 6 (six) hours as needed. 01/25/24   Doretha Folks, MD  TRANSDERM-SCOP 1 MG/3DAYS Place 1 patch (1.5 mg total) onto the skin every 3 (three) days. As needed for nausea 10/21/22   Clark, Meghan R, PA-C  Vitamin D, Ergocalciferol, (DRISDOL) 1.25 MG (50000 UNIT) CAPS capsule Take 50,000 Units by mouth once a week. 09/20/22   [provider]    Allergies: Metoclopramide , Codeine, Hydrocodone , and Percocet [oxycodone -acetaminophen ]    Review of Systems  Updated Vital Signs BP 132/81   Pulse (!) 101   Temp 98.6 F (37 C) (Oral)   Resp 17   Ht 5' 9 (1.753 m)   Wt (!) 150 kg   LMP 03/24/2017   SpO2 97%   BMI 48.83 kg/m   Physical Exam Vitals and nursing note reviewed.  Constitutional:      General: She is not in acute  distress.    Appearance: She is well-developed. She is ill-appearing.  HENT:     Head: Normocephalic and atraumatic.     Mouth/Throat:     Mouth: Mucous membranes are dry.  Eyes:     Conjunctiva/sclera: Conjunctivae normal.  Cardiovascular:     Rate and Rhythm: Regular rhythm. Tachycardia present.     Heart sounds: No murmur heard. Pulmonary:     Effort: Pulmonary effort is normal. No respiratory distress.     Breath sounds: Normal breath sounds.  Abdominal:     Palpations: Abdomen is soft.     Tenderness: There is no abdominal tenderness.  Musculoskeletal:        General: No swelling.     Cervical back: Neck supple.  Skin:    General: Skin is warm and dry.     Capillary Refill: Capillary refill takes less than 2 seconds.     Findings: Rash present.     Comments: Fungal type irritation to the skin in the pannus area  Neurological:     Mental Status: She is alert.     Comments: She is somnolent but arousable moves all extremities pupils are equal and reactive but cannot really get her to fully participate in exam due to being somnolent  Psychiatric:        Mood and Affect: Mood normal.     (all labs ordered are listed, but only abnormal results are displayed) Labs Reviewed  COMPREHENSIVE METABOLIC PANEL WITH GFR - Abnormal; Notable for the following components:      Result Value   CO2 21 (*)    Glucose, Bld 216 (*)    BUN 39 (*)    Creatinine, Ser 2.03 (*)    Total Bilirubin 1.4 (*)    GFR, Estimated 29 (*)    All other components within normal limits  CBC WITH DIFFERENTIAL/PLATELET - Abnormal; Notable for the following components:   WBC 22.5 (*)    RBC 3.42 (*)  Hemoglobin 9.7 (*)    HCT 30.9 (*)    Neutro Abs 18.5 (*)    Monocytes Absolute 2.4 (*)    Abs Immature Granulocytes 0.17 (*)    All other components within normal limits  URINALYSIS, ROUTINE W REFLEX MICROSCOPIC - Abnormal; Notable for the following components:   Glucose, UA 50 (*)    Ketones, ur 5  (*)    All other components within normal limits  RAPID URINE DRUG SCREEN, HOSP PERFORMED - Abnormal; Notable for the following components:   Opiates POSITIVE (*)    All other components within normal limits  BRAIN NATRIURETIC PEPTIDE - Abnormal; Notable for the following components:   B Natriuretic Peptide 105.4 (*)    All other components within normal limits  BLOOD GAS, VENOUS - Abnormal; Notable for the following components:   pCO2, Ven 37 (*)    pO2, Ven <31 (*)    Acid-base deficit 5.2 (*)    All other components within normal limits  CBG MONITORING, ED - Abnormal; Notable for the following components:   Glucose-Capillary 238 (*)    All other components within normal limits  I-STAT CHEM 8, ED - Abnormal; Notable for the following components:   BUN 34 (*)    Creatinine, Ser 2.20 (*)    Glucose, Bld 211 (*)    TCO2 17 (*)    Hemoglobin 9.9 (*)    HCT 29.0 (*)    All other components within normal limits  I-STAT CG4 LACTIC ACID, ED - Abnormal; Notable for the following components:   Lactic Acid, Venous 2.0 (*)    All other components within normal limits  TROPONIN I (HIGH SENSITIVITY) - Abnormal; Notable for the following components:   Troponin I (High Sensitivity) 857 (*)    All other components within normal limits  RESP PANEL BY RT-PCR (RSV, FLU A&B, COVID)  RVPGX2  CULTURE, BLOOD (ROUTINE X 2)  CULTURE, BLOOD (ROUTINE X 2)  URINE CULTURE  ETHANOL  LIPASE, BLOOD  LACTIC ACID, PLASMA  LACTIC ACID, PLASMA  TROPONIN I (HIGH SENSITIVITY)    EKG: EKG Interpretation Date/Time:  Wednesday February 14 2024 21:08:16 EDT Ventricular Rate:  99 PR Interval:  142 QRS Duration:  119 QT Interval:  376 QTC Calculation: 483 R Axis:   40  Text Interpretation: Sinus rhythm Nonspecific intraventricular conduction delay Low voltage, precordial leads Confirmed by Ruthe Cornet [phone]) on 02/14/2024 9:09:45 PM  Radiology: CT HEAD WO CONTRAST Result Date: 02/14/2024 CLINICAL DATA:   Altered mental status EXAM: CT HEAD WITHOUT CONTRAST TECHNIQUE: Contiguous axial images were obtained from the base of the skull through the vertex without intravenous contrast. RADIATION DOSE REDUCTION: This exam was performed according to the departmental dose-optimization program which includes automated exposure control, adjustment of the mA and/or kV according to patient size and/or use of iterative reconstruction technique. COMPARISON:  10/23/2022 FINDINGS: Brain: No evidence of acute infarction, hemorrhage, hydrocephalus, extra-axial collection or mass lesion/mass effect. Vascular: No hyperdense vessel or unexpected calcification. Skull: Normal. Negative for fracture or focal lesion. Sinuses/Orbits: No acute finding. Other: None. IMPRESSION: No acute abnormality noted. Electronically Signed   By: Oneil Devonshire M.D.   On: 02/14/2024 21:59   DG Chest Port 1 View Result Date: 02/14/2024 CLINICAL DATA:  Altered mental status. EXAM: PORTABLE CHEST 1 VIEW COMPARISON:  Chest x-ray 02/10/2024 FINDINGS: Enlarged cardiopericardial silhouette vascular congestion. Question trace edema. Film is rotated to left. Films are also under penetrated. No pneumothorax. No large effusion. Overlapping cardiac leads.  IMPRESSION: Limited x-ray. Enlarged cardiac silhouette with vascular congestion. Question trace edema. Electronically Signed   By: Ranell Bring M.D.   On: 02/14/2024 19:41     Procedures   Medications Ordered in the ED  naloxone  (NARCAN ) injection 0.4 mg (0.4 mg Intravenous Not Given 02/14/24 2042)  vancomycin  (VANCOREADY) IVPB 2000 mg/400 mL (2,000 mg Intravenous New Bag/Given 02/14/24 2235)  naloxone  (NARCAN ) nasal spray 4 mg/0.1 mL (1 spray Nasal Provided for home use 02/14/24 1914)  naloxone  (NARCAN ) 2 MG/2ML injection (2 mg  Given 02/14/24 2004)  ceFEPIme  (MAXIPIME ) 2 g in sodium chloride  0.9 % 100 mL IVPB (0 g Intravenous Stopped 02/14/24 2235)                                    Medical Decision  Making Amount and/or Complexity of Data Reviewed Labs: ordered. Radiology: ordered.  Risk Prescription drug management. Decision regarding hospitalization.   [place] is here for altered mental status.  History of hypertension high cholesterol lupus blood clot on anticoagulation diabetes.  Cannot really get much of a history from the patient.  But from the caregiver and sister patient's not been doing well the last couple of days.  They have noticed that she has been more swollen.  She has been more sleepy the last few days.  She was seen in the emergency department and got prescribed pain medicine recently.  She is more somnolent and sleepy and more lethargic than normal when caregiver got there several hours ago and called EMS.  She arrives here with overall unremarkable vitals.  She is somnolent but arousable.  Seems like she moves all of her extremities.  I gave her some intranasal Narcan  without much improvement.  Differential is wide could be polypharmacy could be infectious process could be neurologic process or cardiac process.  She does not seem to be in any respiratory distress but she does seem somnolent and somewhat sedated.  My suspicion is likely for polypharmacy but will evaluate.  Will get blood cultures blood gas lactic acid basic labs chest x-ray head CT urine drug screen ethanol level.  Additional IV Narcan  was given without much change in her mental status.  Lactic acid came back at 2.  Urinalysis negative for infection.  Drug screen positive for opioids.  Creatinine was 2.  White count 22.  Troponin elevated to 857.  EKG however with no ischemic changes.  BNP 105.  Blood gas somewhat reassuring with a pH of 7.34.  CO2 of 37.  Chest x-ray no evidence of infection.  CT head scan pending.  Repeat troponin pending.  She still pretty somnolent.  Still easily arousable but not sure if there is a cardiac process ongoing or if there is polypharmacy process.  Possibly could be infectious  process given the pannus looks somewhat infected but I will talk with ICU for their opinion.  I think it would be somewhat reasonable for her to be ops in the ICU overnight but will try to get their opinion.  Will trend troponin to see if there is any major change.  She is not really endorsing any pain but somewhat too somnolent to talk.  She is supposedly on Eliquis  for blood clots.  Assuming head CT is unremarkable might start her on heparin .  I do team about the case.  They think that she should be okay for stepdown with hospitalist.  Recommended that  I asked the hospitalist to come evaluate the patient.  She is somnolent but arousable.  Not sure if she is just heavily sleeping or not.  Ultimately we will trend troponin.  Will talk with cardiology.  She has been getting IV antibiotics for possible cellulitis of the abdominal wall.  Talked with Dr. Sullivin with cardiology who recommended trending troponin and holding off on anticoagulation at this time.  Troponin leak likely from demand.  Overall I talked with Dr. Tobie with hospitalist.  We evaluated the patient together.  Will admit for further care.  My suspicion is for polypharmacy.  Her mental status has improved she is more awake upon my evaluation of Dr. Tobie.  Will continue to trend troponin get echocardiogram and adjust medications.  This chart was dictated using voice recognition software.  Despite best efforts to proofread,  errors can occur which can change the documentation meaning.      Final diagnoses:  Weakness  Altered mental status, unspecified altered mental status type  Elevated troponin    ED Discharge Orders     None          Ruthe Cornet, DO 02/14/24 2251

## 2024-02-14 NOTE — Progress Notes (Signed)
 Pharmacy Antibiotic Note  [name] is a 53 y.o. female admitted on 02/14/2024 with sepsis.  Pharmacy has been consulted for Cefepime  + Vancomycin  dosing.  Plan: Cefepime  2gm IV q12h Vancomycin  1gm IV q24h to target AUC 400-550 -Estimated AUC on current regimen = 444 Monitor renal function and cx data   Height: 5' 9 (175.3 cm) Weight: (!) 150 kg (330 lb 11 oz) IBW/kg (Calculated) : 66.2  Temp (24hrs), Avg:98.6 F (37 C), Min:98.6 F (37 C), Max:98.6 F (37 C)  Recent Labs  Lab 02/09/24 2334 02/09/24 2345 02/14/24 2011 02/14/24 2103 02/14/24 2104  WBC 12.7*  --  22.5*  --   --   CREATININE  --  1.58* 2.03*  --  2.20*  LATICACIDVEN  --   --   --  2.0*  --     Estimated Creatinine Clearance: 46.5 mL/min (A) (by C-G formula based on SCr of 2.2 mg/dL (H)).    Allergies  Allergen Reactions   Metoclopramide  Other (See Comments)    Developed restless leg, akathisia type limb movements.    Codeine Itching and Rash   Hydrocodone  Rash   Percocet [Oxycodone -Acetaminophen ] Hives and Rash    Tolerates dilaudid  Tolerates acetaminophen      Antimicrobials this admission: 7/16 Cefepime  >>  7/16 Vancomycin  >>   Dose adjustments this admission:  Microbiology results: 7/16 BCx:  7/16 UCx:   7/16 Resp PCR: negative  Thank you for allowing pharmacy to be a part of this patient's care.  [name] PharmD 02/14/2024 11:39 PM

## 2024-02-14 NOTE — ED Notes (Signed)
 I Stat Lactic acid is 2.02 EDP [place] notified

## 2024-02-14 NOTE — Progress Notes (Signed)
 A consult was received from an ED physician for vancomycin  and cefepime  per pharmacy dosing (for an indication other than meningitis). The patient's profile has been reviewed for ht/wt/allergies/indication/available labs. A one time order has been placed for the above antibiotics.  Further antibiotics/pharmacy consults should be ordered by admitting physician if indicated.                       [name], PharmD, BCPS [phone] 02/14/2024, 9:10 PM

## 2024-02-15 ENCOUNTER — Other Ambulatory Visit: Payer: Self-pay

## 2024-02-15 ENCOUNTER — Inpatient Hospital Stay (HOSPITAL_COMMUNITY)

## 2024-02-15 DIAGNOSIS — R7989 Other specified abnormal findings of blood chemistry: Secondary | ICD-10-CM

## 2024-02-15 DIAGNOSIS — R4182 Altered mental status, unspecified: Secondary | ICD-10-CM | POA: Diagnosis not present

## 2024-02-15 DIAGNOSIS — N189 Chronic kidney disease, unspecified: Secondary | ICD-10-CM

## 2024-02-15 DIAGNOSIS — E039 Hypothyroidism, unspecified: Secondary | ICD-10-CM

## 2024-02-15 DIAGNOSIS — Z86718 Personal history of other venous thrombosis and embolism: Secondary | ICD-10-CM

## 2024-02-15 DIAGNOSIS — R531 Weakness: Secondary | ICD-10-CM

## 2024-02-15 DIAGNOSIS — I10 Essential (primary) hypertension: Secondary | ICD-10-CM

## 2024-02-15 DIAGNOSIS — N179 Acute kidney failure, unspecified: Secondary | ICD-10-CM | POA: Diagnosis not present

## 2024-02-15 DIAGNOSIS — R6510 Systemic inflammatory response syndrome (SIRS) of non-infectious origin without acute organ dysfunction: Secondary | ICD-10-CM

## 2024-02-15 DIAGNOSIS — I5021 Acute systolic (congestive) heart failure: Secondary | ICD-10-CM

## 2024-02-15 DIAGNOSIS — I5032 Chronic diastolic (congestive) heart failure: Secondary | ICD-10-CM

## 2024-02-15 DIAGNOSIS — R079 Chest pain, unspecified: Secondary | ICD-10-CM | POA: Diagnosis not present

## 2024-02-15 DIAGNOSIS — I214 Non-ST elevation (NSTEMI) myocardial infarction: Secondary | ICD-10-CM

## 2024-02-15 DIAGNOSIS — G9340 Encephalopathy, unspecified: Secondary | ICD-10-CM | POA: Diagnosis not present

## 2024-02-15 LAB — COMPREHENSIVE METABOLIC PANEL WITH GFR
ALT: 15 U/L (ref 0–44)
AST: 22 U/L (ref 15–41)
Albumin: 2.8 g/dL — ABNORMAL LOW (ref 3.5–5.0)
Alkaline Phosphatase: 58 U/L (ref 38–126)
Anion gap: 7 (ref 5–15)
BUN: 33 mg/dL — ABNORMAL HIGH (ref 6–20)
CO2: 22 mmol/L (ref 22–32)
Calcium: 8.7 mg/dL — ABNORMAL LOW (ref 8.9–10.3)
Chloride: 110 mmol/L (ref 98–111)
Creatinine, Ser: 1.55 mg/dL — ABNORMAL HIGH (ref 0.44–1.00)
GFR, Estimated: 40 mL/min — ABNORMAL LOW (ref >60)
Glucose, Bld: 190 mg/dL — ABNORMAL HIGH (ref 70–99)
Potassium: 3.7 mmol/L (ref 3.5–5.1)
Sodium: 139 mmol/L (ref 135–145)
Total Bilirubin: 1.5 mg/dL — ABNORMAL HIGH (ref 0.0–1.2)
Total Protein: 5.5 g/dL — ABNORMAL LOW (ref 6.5–8.1)

## 2024-02-15 LAB — HEPARIN LEVEL (UNFRACTIONATED)
Heparin Unfractionated: <0.1 [IU]/mL — ABNORMAL LOW (ref 0.30–0.70)
Heparin Unfractionated: 0.29 [IU]/mL — ABNORMAL LOW (ref 0.30–0.70)

## 2024-02-15 LAB — HIV ANTIBODY (ROUTINE TESTING W REFLEX): HIV Screen 4th Generation wRfx: NONREACTIVE

## 2024-02-15 LAB — CBC
HCT: 27.9 % — ABNORMAL LOW (ref 36.0–46.0)
Hemoglobin: 8.8 g/dL — ABNORMAL LOW (ref 12.0–15.0)
MCH: 28.2 pg (ref 26.0–34.0)
MCHC: 31.5 g/dL (ref 30.0–36.0)
MCV: 89.4 fL (ref 80.0–100.0)
Platelets: 147 K/uL — ABNORMAL LOW (ref 150–400)
RBC: 3.12 MIL/uL — ABNORMAL LOW (ref 3.87–5.11)
RDW: 13.9 % (ref 11.5–15.5)
WBC: 19.5 K/uL — ABNORMAL HIGH (ref 4.0–10.5)
nRBC: 0 % (ref 0.0–0.2)

## 2024-02-15 LAB — ECHOCARDIOGRAM COMPLETE
Area-P 1/2: 4.74 cm2
Calc EF: 42.1 %
Height: 69 in
S' Lateral: 4.1 cm
Single Plane A2C EF: 37.4 %
Single Plane A4C EF: 42.1 %
Weight: 5925.96 [oz_av]

## 2024-02-15 LAB — LACTIC ACID, PLASMA: Lactic Acid, Venous: 1.9 mmol/L (ref 0.5–1.9)

## 2024-02-15 LAB — GLUCOSE, CAPILLARY
Glucose-Capillary: 174 mg/dL — ABNORMAL HIGH (ref 70–99)
Glucose-Capillary: 166 mg/dL — ABNORMAL HIGH (ref 70–99)
Glucose-Capillary: 204 mg/dL — ABNORMAL HIGH (ref 70–99)
Glucose-Capillary: 196 mg/dL — ABNORMAL HIGH (ref 70–99)

## 2024-02-15 LAB — APTT: aPTT: 24 s (ref 24–36)

## 2024-02-15 LAB — CREATININE, URINE, RANDOM: Creatinine, Urine: 314 mg/dL

## 2024-02-15 LAB — PROTIME-INR
INR: 1.3 — ABNORMAL HIGH (ref 0.8–1.2)
Prothrombin Time: 17 s — ABNORMAL HIGH (ref 11.4–15.2)

## 2024-02-15 LAB — CBG MONITORING, ED: Glucose-Capillary: 179 mg/dL — ABNORMAL HIGH (ref 70–99)

## 2024-02-15 LAB — TROPONIN I (HIGH SENSITIVITY)
Troponin I (High Sensitivity): 1370 ng/L (ref <18)
Troponin I (High Sensitivity): 1210 ng/L (ref <18)

## 2024-02-15 LAB — MRSA NEXT GEN BY PCR, NASAL: MRSA by PCR Next Gen: NOT DETECTED

## 2024-02-15 LAB — SODIUM, URINE, RANDOM: Sodium, Ur: 31 mmol/L

## 2024-02-15 MED ORDER — ISOSORB DINITRATE-HYDRALAZINE 20-37.5 MG PO TABS
1.0000 | ORAL_TABLET | Freq: Three times a day (TID) | ORAL | Status: AC
  Administered 2024-02-15 – 2024-02-16 (×3): 1 via ORAL
  Filled 2024-02-15 (×5): qty 1

## 2024-02-15 MED ORDER — LEVOTHYROXINE SODIUM 50 MCG PO TABS
50.0000 ug | ORAL_TABLET | Freq: Every day | ORAL | Status: DC
  Administered 2024-02-15 – 2024-02-29 (×15): 50 ug via ORAL
  Filled 2024-02-15 (×2): qty 1
  Filled 2024-02-15: qty 2
  Filled 2024-02-15 (×12): qty 1

## 2024-02-15 MED ORDER — PERFLUTREN LIPID MICROSPHERE
1.0000 mL | INTRAVENOUS | Status: AC | PRN
  Administered 2024-02-15: 2 mL via INTRAVENOUS

## 2024-02-15 MED ORDER — TRAMADOL HCL 50 MG PO TABS
50.0000 mg | ORAL_TABLET | Freq: Four times a day (QID) | ORAL | Status: DC | PRN
  Administered 2024-02-15 – 2024-02-27 (×4): 50 mg via ORAL
  Filled 2024-02-15 (×7): qty 1

## 2024-02-15 MED ORDER — APIXABAN 2.5 MG PO TABS: 2.5000 mg | ORAL_TABLET | Freq: Two times a day (BID) | ORAL | Status: DC

## 2024-02-15 MED ORDER — HEPARIN (PORCINE) 25000 UT/250ML-% IV SOLN
1600.0000 [IU]/h | INTRAVENOUS | Status: DC
  Administered 2024-02-15: 1400 [IU]/h via INTRAVENOUS
  Filled 2024-02-15: qty 250

## 2024-02-15 MED ORDER — SODIUM CHLORIDE 0.9 % IV SOLN
2.0000 g | Freq: Three times a day (TID) | INTRAVENOUS | Status: DC
  Administered 2024-02-15 – 2024-02-19 (×13): 2 g via INTRAVENOUS
  Filled 2024-02-15 (×13): qty 12.5

## 2024-02-15 MED ORDER — SODIUM CHLORIDE 0.9% FLUSH
10.0000 mL | Freq: Two times a day (BID) | INTRAVENOUS | Status: DC
  Administered 2024-02-15 – 2024-02-29 (×21): 10 mL

## 2024-02-15 MED ORDER — CHLORHEXIDINE GLUCONATE CLOTH 2 % EX PADS
6.0000 | MEDICATED_PAD | Freq: Every day | CUTANEOUS | Status: DC
  Administered 2024-02-15 – 2024-02-29 (×15): 6 via TOPICAL

## 2024-02-15 MED ORDER — HEPARIN (PORCINE) 25000 UT/250ML-% IV SOLN
1600.0000 [IU]/h | INTRAVENOUS | Status: DC
  Administered 2024-02-15 – 2024-02-16 (×2): 1600 [IU]/h via INTRAVENOUS
  Filled 2024-02-15 (×2): qty 250

## 2024-02-15 MED ORDER — ENSURE PLUS HIGH PROTEIN PO LIQD
237.0000 mL | Freq: Two times a day (BID) | ORAL | Status: DC
  Administered 2024-02-15 – 2024-02-29 (×17): 237 mL via ORAL

## 2024-02-15 MED ORDER — VANCOMYCIN HCL 1750 MG/350ML IV SOLN
1750.0000 mg | INTRAVENOUS | Status: DC
  Administered 2024-02-15: 1750 mg via INTRAVENOUS
  Filled 2024-02-15: qty 350

## 2024-02-15 MED ORDER — HEPARIN BOLUS VIA INFUSION
4000.0000 [IU] | Freq: Once | INTRAVENOUS | Status: AC
  Administered 2024-02-15: 4000 [IU] via INTRAVENOUS
  Filled 2024-02-15: qty 4000

## 2024-02-15 MED ORDER — MORPHINE SULFATE (PF) 2 MG/ML IV SOLN
2.0000 mg | INTRAVENOUS | Status: DC | PRN
  Administered 2024-02-15 – 2024-02-27 (×46): 2 mg via INTRAVENOUS
  Filled 2024-02-15 (×46): qty 1

## 2024-02-15 MED ORDER — COLLAGENASE 250 UNIT/GM EX OINT
TOPICAL_OINTMENT | Freq: Every day | CUTANEOUS | Status: DC
  Administered 2024-02-15: 1 via TOPICAL
  Filled 2024-02-15 (×4): qty 30

## 2024-02-15 MED ORDER — SODIUM CHLORIDE 0.9% FLUSH: 10.0000 mL | INTRAVENOUS | Status: DC | PRN

## 2024-02-15 NOTE — Progress Notes (Signed)
 PHARMACY - ANTICOAGULATION CONSULT NOTE  Pharmacy Consult for Heparin  Indication: chest pain/ACS  Allergies  Allergen Reactions   Metoclopramide  Other (See Comments)    Developed restless leg, akathisia type limb movements.    Codeine Itching and Rash   Hydrocodone  Rash   Percocet [Oxycodone -Acetaminophen ] Hives and Rash    Tolerates dilaudid  Tolerates acetaminophen      Patient Measurements: Height: 5' 9 (175.3 cm) Weight: (!) 150 kg (330 lb 11 oz) IBW/kg (Calculated) : 66.2 HEPARIN  DW (KG): 102.9  Vital Signs: Temp: 97.7 F (36.5 C) (07/17 0124) Temp Source: Axillary (07/17 0124) BP: 132/81 (07/16 2200) Pulse Rate: 101 (07/16 2200)  Labs: Recent Labs    02/14/24 2011 02/14/24 2104 02/15/24 0038  HGB 9.7* 9.9*  --   HCT 30.9* 29.0*  --   PLT 166  --   --   CREATININE 2.03* 2.20*  --   TROPONINIHS 857*  --  1,370*    Estimated Creatinine Clearance: 46.5 mL/min (A) (by C-G formula based on SCr of 2.2 mg/dL (H)).   Medical History: Past Medical History:  Diagnosis Date   Acute renal failure (HCC)     Intractable nausea vomiting secondary to diabetic gastroparesis causing dehydration and acute renal failure /notes 04/01/2013   Anginal pain (HCC)    Anxiety    Bell's palsy 08/09/2010   Bicornuate uterus    CAP (community acquired pneumonia) 10/2011   thelbert 11/08/2011   Chest pain 01/13/2014   CHF (congestive heart failure) (HCC)    Diabetic gastroparesis associated with type 2 diabetes mellitus (HCC)    this is presumed diagnoses, not confirmed by any studies.    Eczema    Family history of breast cancer    Family history of malignant neoplasm of breast    Family history of malignant neoplasm of ovary    Family history of ovarian cancer    Family history of prostate cancer    GERD (gastroesophageal reflux disease)    High cholesterol    Hypertension    Liver lesion 08/13/2019   Liver mass 2014   biopsied 03/2013 at [facility], not malignant.  is to  undergo radiologic ablation of the mass in sept/October 2014.    Lupus    Migraines    maybe a couple times/yr (04/01/2013)   Obesity    Obstructive sleep apnea on CPAP 2011   Oxygen at nights   Pulmonary embolism (HCC) 05/21/2021   SJOGREN'S SYNDROME 08/09/2010   SLE (systemic lupus erythematosus) (HCC)    thelbert 12/01/2010    Medications:  Infusions:   ceFEPime  (MAXIPIME ) IV     lactated ringers  Stopped (02/15/24 0008)   lactated ringers  Stopped (02/15/24 0007)   metronidazole  Stopped (02/15/24 0007)   vancomycin       Assessment: 53 yo F with hx VTE on chronic Eliquis  for VTE prevention.   Last dose Eliquis  unknown. AKI - Scr 2.2, CBC- Hg low at 9.9, pltc WNL.  Troponin increasing. Baseline heparin  level = <0.1.  Goal of Therapy:  Heparin  level 0.3-0.7 units/ml aPTT 66-102 seconds Monitor platelets by anticoagulation protocol: Yes   Plan:  Initiate Heparin  4000 units IV bolus x1 then infusion at 1400 units/hr Check 8h heparin  level Daily heparin  level & CBC while on heparin  F/U plans to resume apixaban   [name] PharmD 02/15/2024,1:39 AM

## 2024-02-15 NOTE — ED Notes (Addendum)
 Report given to Alan Portugal RN. Will report to day shift, patient can not go up till after seven.

## 2024-02-15 NOTE — ED Notes (Signed)
 [place] at bedside

## 2024-02-15 NOTE — ED Notes (Signed)
 CBG 179

## 2024-02-15 NOTE — Consult Note (Signed)
 Cardiology Consultation   Patient ID: SHANAYE RIEF MRN: [id number]; DOB: 1971-07-21  Admit date: 02/14/2024 Date of Consult: 02/15/2024  PCP:  [name], MD   [facility] Providers Cardiologist:  Gordy Bergamo, MD     Patient Profile: [name] is a 53 y.o. female with a hx of nonischemic cardiomyopathy, Sjogren's syndrome, SLE, morbid obesity, OHS, OSA on CPAP, uncontrolled type 2 diabetes, hypertension, history of CVA in 2014, hepatic adenoma s/p embolization in 2021, history of PE on chronic anticoagulation with Eliquis  who is being seen 02/15/2024 for the evaluation of elevated troponin at the request of Dr. Drusilla.  History of Present Illness: Ms. Son is a 53 year old female with above medical history who has been followed by Dr. Bergamo.  Patient previously had coronary angiogram in 2017 that showed normal coronary arteries.  Since then she has been on guideline directed medical therapy for nonischemic cardiomyopathy.  Echocardiogram in 09/2017 showed EF 30-35% with grade 1 DD.  3 months later, echocardiogram in 05/2018 showed EF had improved to 45-50%.  EF has continued to fluctuate between a proximal 30% and 45% since then.  Most recent echocardiogram from 10/24/2022 showed EF up to 55-60%, mild LVH, normal RV systolic function, normal PA systolic pressure, no significant valvular abnormalities.  Patient was admitted in 04/2019 for with intractable nausea and vomiting, diabetic gastroparesis, UTI, AKI, dehydration.  At discharge, she was instructed to stop her antihypertensive medications including amlodipine , carvedilol , BiDil .  She was seen by Dr. Thompson in 07/2023.  At that time, her BiDil  was resumed.  Remained on Entresto  and Jardiance .  On 01/24/2024, patient was seen in the ED for UTI, started on antibiotics.  Again seen on 6/28 in the ED with abdominal pain, back pain.  It was thought that her antibiotics were causing her abdominal discomfort.  Back pain felt to  be musculoskeletal in nature.  Was prescribed antiemetics and pain medications, antibiotics discontinued.  She was later admitted to [facility] on 01/29/2024 for intractable nausea and vomiting.  It was felt to be secondary to a gastroparesis flare, treated with Reglan /baclofen with improvement in her symptoms.  During her admission she had an AKI which improved prior to discharge.  Was not felt to be in a heart failure exacerbation, remained on carvedilol , Entresto , spironolactone , Crestor .  Remained on Eliquis  for history of DVT.  She was referred to a pain clinic for back pain.  Patient seen again in the ED on 7/11 with complaints of back pain, flank pain, leg swelling.  Noted to have 1+ edema in bilateral lower extremities.  Chest x-ray did show increasing pleural effusions with central pulmonary vascular congestion, overall suspicious for CHF.  She was given IV pain medicine, muscle relaxer with improvement in symptoms.  Was instructed to follow-up with PCP  Patient presented to the ED on 7/16 with increased fatigue.  Per EMS, patient's family was concerned that she had taken too many of her prescribed pain medications.  In the ED she was given intranasal Narcan  with minimal response.  Also given Narcan  injections  Initial vital signs in the ED showed BP 84/68, heart rate 102 bpm, temperature 98.6 F, oxygen 98% on room air.  Labs significant for creatinine 2.03, WBC 22.5, hemoglobin 9.7.  Lactic acid 2.0>1.9. hsTn [phone]. BNP 105.  Chest x-ray showed enlarged cardiac silhouette with vascular congestion.  She was admitted to the internal medicine service for treatment of acute encephalopathy, AKI, elevated troponin.  Met SIRS criteria with  leukocytosis, tachycardia, AKI.  Given IV fluids and started on IV antibiotics.  Today, patient denies chest pain, shortness of breath, orthopnea, palpitations, dizziness, syncope, near syncope.  Denies recent episodes of chest discomfort or shortness of  breath at home.  She admits that she is not very active in her daily life.  Spends most of her day in bed or sitting in chairs.  Is able to do some light housework but overall is inactive.  She denies recent fever, chills, body aches.  Reports that she has been told she has arthritis in her back and that is causing her quite a bit of discomfort and issues recently.  Feels like she is falling onto a bit of fluid in her legs.  Past Medical History:  Diagnosis Date   Acute renal failure (HCC)     Intractable nausea vomiting secondary to diabetic gastroparesis causing dehydration and acute renal failure /notes 04/01/2013   Anginal pain (HCC)    Anxiety    Bell's palsy 08/09/2010   Bicornuate uterus    CAP (community acquired pneumonia) 10/2011   thelbert 11/08/2011   Chest pain 01/13/2014   CHF (congestive heart failure) (HCC)    Diabetic gastroparesis associated with type 2 diabetes mellitus (HCC)    this is presumed diagnoses, not confirmed by any studies.    Eczema    Family history of breast cancer    Family history of malignant neoplasm of breast    Family history of malignant neoplasm of ovary    Family history of ovarian cancer    Family history of prostate cancer    GERD (gastroesophageal reflux disease)    High cholesterol    Hypertension    Liver lesion 08/13/2019   Liver mass 2014   biopsied 03/2013 at [facility], not malignant.  is to undergo radiologic ablation of the mass in sept/October 2014.    Lupus    Migraines    maybe a couple times/yr (04/01/2013)   Obesity    Obstructive sleep apnea on CPAP 2011   Oxygen at nights   Pulmonary embolism (HCC) 05/21/2021   SJOGREN'S SYNDROME 08/09/2010   SLE (systemic lupus erythematosus) (HCC)    thelbert 12/01/2010    Past Surgical History:  Procedure Laterality Date   BREAST CYST EXCISION Left 08/2005   epidermoid   CARDIAC CATHETERIZATION  02/07/2012   ECTOPIC PREGNANCY SURGERY  1999   ESOPHAGOGASTRODUODENOSCOPY N/A  02/26/2013   Procedure: ESOPHAGOGASTRODUODENOSCOPY (EGD);  Surgeon: Norleen LOISE Kiang, MD;  Location: [facility] ENDOSCOPY;  Service: Endoscopy;  Laterality: N/A;   ESOPHAGOGASTRODUODENOSCOPY N/A 03/02/2015   Procedure: ESOPHAGOGASTRODUODENOSCOPY (EGD);  Surgeon: Toribio SHAUNNA Cedar, MD;  Location: [facility] ENDOSCOPY;  Service: Endoscopy;  Laterality: N/A;   ESOPHAGOGASTRODUODENOSCOPY N/A 02/23/2021   Procedure: ESOPHAGOGASTRODUODENOSCOPY (EGD);  Surgeon: Dianna Specking, MD;  Location: [facility] ENDOSCOPY;  Service: Endoscopy;  Laterality: N/A;  possible dilation   HERNIA REPAIR     LEFT AND RIGHT HEART CATHETERIZATION WITH CORONARY ANGIOGRAM N/A 02/07/2012   Procedure: LEFT AND RIGHT HEART CATHETERIZATION WITH CORONARY ANGIOGRAM;  Surgeon: Erick JONELLE Bergamo, MD;  Location: [facility] CATH LAB;  Service: Cardiovascular;  Laterality: N/A;   LIVER BIOPSY  03/2013   liver mass/medical hx noted above   MUSCLE BIOPSY     for lupus/notes 12/01/2010   UMBILICAL HERNIA REPAIR  1980's      Scheduled Meds:  Chlorhexidine  Gluconate Cloth  6 each Topical Daily   collagenase    Topical Daily   insulin  aspart  0-5 Units Subcutaneous QHS  insulin  aspart  0-9 Units Subcutaneous TID WC   levothyroxine   50 mcg Oral Q0600   naLOXone  (NARCAN )  injection  0.4 mg Intravenous Once   sodium chloride  flush  3 mL Intravenous Q12H   Continuous Infusions:  ceFEPime  (MAXIPIME ) IV 2 g (02/15/24 1001)   heparin  1,400 Units/hr (02/15/24 0954)   metronidazole  Stopped (02/15/24 0337)   vancomycin      PRN Meds: acetaminophen  **OR** acetaminophen , bisacodyl , perflutren  lipid microspheres (DEFINITY ) IV suspension, prochlorperazine , senna-docusate, traMADol   Allergies:    Allergies  Allergen Reactions   Metoclopramide  Other (See Comments)    Developed restless leg, akathisia type limb movements.    Codeine Itching and Rash   Hydrocodone  Rash   Percocet [Oxycodone -Acetaminophen ] Hives and Rash    Tolerates dilaudid , Tolerates acetaminophen       Social History:   Social History   Socioeconomic History   Marital status: Single    Spouse name: Not on file   Number of children: 1   Years of education: Not on file   Highest education level: Not on file  Occupational History    Employer: UNEMPLOYED    Comment: student  Tobacco Use   Smoking status: Former    Current packs/day: 0.00    Average packs/day: 0.5 packs/day for 3.0 years (1.5 ttl pk-yrs)    Types: Cigarettes    Start date: 06/01/1988    Quit date: 06/02/1991    Years since quitting: 32.7   Smokeless tobacco: Never   Tobacco comments:    quit 28 yrs ago  Vaping Use   Vaping status: Never Used  Substance and Sexual Activity   Alcohol  use: No    Alcohol /week: 0.0 standard drinks of alcohol    Drug use: No   Sexual activity: Yes    Partners: Male    Birth control/protection: None  Other Topics Concern   Not on file  Social History Narrative   Regular exercise-yes   Social Drivers of Health   Financial Resource Strain: Low Risk  (03/30/2022)   [facility] visits prior to 10/01/2022.   Overall Financial Resource Strain (CARDIA)    Difficulty of Paying Living Expenses: Not hard at all  Food Insecurity: No Food Insecurity (02/15/2024)   Hunger Vital Sign    Worried About Running Out of Food in the Last Year: Never true    Ran Out of Food in the Last Year: Never true  Recent Concern: Food Insecurity - Medium Risk (02/07/2024)   Received from [facility]   Hunger Vital Sign    Within the past 12 months, you worried that your food would run out before you got money to buy more: Never true    Within the past 12 months, the food you bought just didn't last and you didn't have money to get more. : Sometimes true  Transportation Needs: No Transportation Needs (02/15/2024)   PRAPARE - Administrator, Civil Service (Medical): No    Lack of Transportation (Non-Medical): No  Physical Activity: Not on file   Stress: No Stress Concern Present (01/30/2024)   Received from [facility] - Occupational Stress Questionnaire    Feeling of Stress : Not at all  Social Connections: Unknown (12/11/2022)   Received from [facility]   Social Network    Social Network: Not on file  Intimate Partner Violence: Not At Risk (02/15/2024)   Humiliation, Afraid, Rape, and Kick questionnaire  Fear of Current or Ex-Partner: No    Emotionally Abused: No    Physically Abused: No    Sexually Abused: No    Family History:   Family History  Problem Relation Age of Onset   Diabetes Mother    Asthma Mother    Urticaria Mother    Hypertension Mother    Stroke Mother    Ovarian cancer Sister 65       maternal half-sister; RAD51C positive   Asthma Brother    Asthma Brother    Breast cancer Maternal Aunt 60       currently 81   Cancer Maternal Aunt        unk. primary   Cancer Maternal Aunt        unk. primary   Ovarian cancer Maternal Aunt        deceased 31s   Breast cancer Maternal Aunt        deceased 63s   Breast cancer Maternal Aunt    Stomach cancer Maternal Uncle        dx 42s; deceased   Prostate cancer Maternal Uncle        deceased 16s   Breast cancer Cousin        deceased 56s; daughter of mat uncle with stomach ca   Hypertension Other    Stroke Other    Arthritis Other    Allergic rhinitis Neg Hx    Angioedema Neg Hx      ROS:  Please see the history of present illness.  All other ROS reviewed and negative.     Physical Exam/Data: Vitals:   02/15/24 0815 02/15/24 0900 02/15/24 0951 02/15/24 1000  BP: 126/74 (!) 111/59  136/68  Pulse: (!) 104 (!) 110 (!) 101 98  Resp: 17 19 (!) 21 18  Temp:      TempSrc:      SpO2: 100% 98% 100% 99%  Weight:      Height:        Intake/Output Summary (Last 24 hours) at 02/15/2024 1040 Last data filed at 02/15/2024 0954 Gross per 24 hour  Intake 1161.28 ml  Output 800 ml  Net 361.28 ml       02/15/2024    8:10 AM 02/14/2024    6:47 PM 02/09/2024   11:23 PM  Last 3 Weights  Weight (lbs) 370 lb 6 oz 330 lb 11 oz 330 lb  Weight (kg) 168 kg 150 kg 149.687 kg     Body mass index is 54.69 kg/m.  General:  middle-aged female.  Laying in the bed with head elevated.  No acute distress HEENT: normal Neck: no JVD, though difficult to assess due to body habitus Vascular: Distal pulses 2+ bilaterally Cardiac:  normal S1, S2; RRR; no murmur  Lungs:  clear to auscultation bilaterally, no wheezing, rhonchi or rales.  Normal work of breathing on room air Abd: soft, nontender, no hepatomegaly  Ext: 1+ edema in BLE  Musculoskeletal:  No deformities  Skin: warm and dry  Neuro:  CNs 2-12 intact, no focal abnormalities noted Psych:  Normal affect   EKG:  The EKG was personally reviewed and demonstrates:  NSR, nonspecific IVCD  Telemetry:  Telemetry was personally reviewed and demonstrates:  NSR, occasional PVCs   Relevant CV Studies: Cardiac Studies & Procedures   ______________________________________________________________________________________________     ECHOCARDIOGRAM  ECHOCARDIOGRAM COMPLETE 10/24/2022  Narrative ECHOCARDIOGRAM REPORT    Patient Name:   ZAYLEI MULLANE Date of Exam: 10/24/2022 Medical Rec #:  [id number]     Height:       68.0 in Accession #:    [id number]    Weight:       339.5 lb Date of Birth:  08-Nov-1970     BSA:          2.559 m Patient Age:    52 years      BP:           134/71 mmHg Patient Gender: F             HR:           96 bpm. Exam Location:  Inpatient  Procedure: 2D Echo, Cardiac Doppler, Color Doppler and Intracardiac Opacification Agent  Indications:    Syncope  History:        Patient has prior history of Echocardiogram examinations, most recent 05/21/2021. CHF, Stroke, Signs/Symptoms:Syncope; Risk Factors:Hypertension, Diabetes and Dyslipidemia.  Sonographer:    Naomie Reef Referring Phys: [id number] TIMOTHY S  OPYD   Sonographer Comments: Technically difficult study due to poor echo windows and patient is obese. IMPRESSIONS   1. Left ventricular ejection fraction, by estimation, is 55 to 60%. The left ventricle has normal function. Left ventricular endocardial border not optimally defined to evaluate regional wall motion. There is mild concentric left ventricular hypertrophy. Left ventricular diastolic parameters are indeterminate. 2. Right ventricular systolic function is normal. The right ventricular size is normal. There is normal pulmonary artery systolic pressure. 3. The mitral valve was not well visualized. Trivial mitral valve regurgitation. No evidence of mitral stenosis. 4. The aortic valve is tricuspid. There is mild calcification of the aortic valve. Aortic valve regurgitation is not visualized. Aortic valve sclerosis/calcification is present, without any evidence of aortic stenosis. Aortic valve area, by VTI measures 2.08 cm. Aortic valve mean gradient measures 5.0 mmHg. Aortic valve Vmax measures 1.46 m/s. 5. The inferior vena cava is normal in size with greater than 50% respiratory variability, suggesting right atrial pressure of 3 mmHg. 6. Very difficult images due to poor sound wave transmission. LVEF appears normal but endocardium not well seen.  FINDINGS Left Ventricle: Left ventricular ejection fraction, by estimation, is 55 to 60%. The left ventricle has normal function. Left ventricular endocardial border not optimally defined to evaluate regional wall motion. Definity  contrast agent was given IV to delineate the left ventricular endocardial borders. The left ventricular internal cavity size was normal in size. There is mild concentric left ventricular hypertrophy. Left ventricular diastolic parameters are indeterminate.  Right Ventricle: The right ventricular size is normal. No increase in right ventricular wall thickness. Right ventricular systolic function is normal. There is  normal pulmonary artery systolic pressure. The tricuspid regurgitant velocity is 2.33 m/s, and with an assumed right atrial pressure of 8 mmHg, the estimated right ventricular systolic pressure is 29.7 mmHg.  Left [facility]: Left atrial size was not well visualized.  Right [facility]: Right atrial size was not well visualized.  Pericardium: Trivial pericardial effusion is present. The pericardial effusion is anterior to the right ventricle.  Mitral Valve: The mitral valve was not well visualized. Trivial mitral valve regurgitation. No evidence of mitral valve stenosis. MV peak gradient, 2.0 mmHg. The mean mitral valve gradient is 1.0 mmHg.  Tricuspid Valve: The tricuspid valve is normal in structure. Tricuspid valve regurgitation is trivial. No evidence of tricuspid stenosis.  Aortic Valve: The aortic valve is tricuspid. There is mild calcification of the aortic valve. Aortic valve regurgitation is not visualized. Aortic valve sclerosis/calcification is present, without  any evidence of aortic stenosis. Aortic valve mean gradient measures 5.0 mmHg. Aortic valve peak gradient measures 8.5 mmHg. Aortic valve area, by VTI measures 2.08 cm.  Pulmonic Valve: The pulmonic valve was normal in structure. Pulmonic valve regurgitation is trivial. No evidence of pulmonic stenosis.  Aorta: The aortic root is normal in size and structure.  Venous: The inferior vena cava is normal in size with greater than 50% respiratory variability, suggesting right atrial pressure of 3 mmHg.  IAS/Shunts: No atrial level shunt detected by color flow Doppler.   LEFT VENTRICLE PLAX 2D LVIDd:         4.90 cm LVIDs:         3.30 cm LV PW:         1.30 cm LV IVS:        1.10 cm LVOT diam:     2.10 cm LV SV:         50 LV SV Index:   20 LVOT Area:     3.46 cm   LEFT ATRIUM         Index LA diam:    3.10 cm 1.21 cm/m AORTIC VALVE                    PULMONIC VALVE AV Area (Vmax):    1.97 cm     PV Vmax:       0.88  m/s AV Area (Vmean):   1.72 cm     PV Peak grad:  3.1 mmHg AV Area (VTI):     2.08 cm AV Vmax:           146.00 cm/s AV Vmean:          99.100 cm/s AV VTI:            0.241 m AV Peak Grad:      8.5 mmHg AV Mean Grad:      5.0 mmHg LVOT Vmax:         82.90 cm/s LVOT Vmean:        49.200 cm/s LVOT VTI:          0.145 m LVOT/AV VTI ratio: 0.60  AORTA Ao Root diam: 3.30 cm  MITRAL VALVE               TRICUSPID VALVE MV Area (PHT): 4.12 cm    TR Peak grad:   21.7 mmHg MV Area VTI:   3.12 cm    TR Vmax:        233.00 cm/s MV Peak grad:  2.0 mmHg MV Mean grad:  1.0 mmHg    SHUNTS MV Vmax:       0.70 m/s    Systemic VTI:  0.14 m MV Vmean:      45.6 cm/s   Systemic Diam: 2.10 cm MV Decel Time: 184 msec MV E velocity: 57.00 cm/s MV A velocity: 62.10 cm/s MV E/A ratio:  0.92  Toribio Fuel MD Electronically signed by Toribio Fuel MD Signature Date/Time: 10/24/2022/4:34:36 PM    Final          ______________________________________________________________________________________________       Laboratory Data: High Sensitivity Troponin:   Recent Labs  Lab 02/14/24 2011 02/15/24 0038 02/15/24 0505  TROPONINIHS 857* 1,370* 1,210*     Chemistry Recent Labs  Lab 02/09/24 2345 02/10/24 0012 02/14/24 2011 02/14/24 2104 02/15/24 0505  NA 142   < > 140 140 139  K 4.2   < > 4.1 3.9 3.7  CL  112*  --  108 108 110  CO2 19*  --  21*  --  22  GLUCOSE 219*  --  216* 211* 190*  BUN 39*  --  39* 34* 33*  CREATININE 1.58*  --  2.03* 2.20* 1.55*  CALCIUM  9.6  --  9.4  --  8.7*  GFRNONAA 39*  --  29*  --  40*  ANIONGAP 11  --  11  --  7   < > = values in this interval not displayed.    Recent Labs  Lab 02/09/24 2345 02/14/24 2011 02/15/24 0505  PROT 7.2 6.9 5.5*  ALBUMIN  3.7 3.5 2.8*  AST 38 27 22  ALT 18 20 15   ALKPHOS 64 74 58  BILITOT 0.8 1.4* 1.5*   Lipids No results for input(s): CHOL, TRIG, HDL, LABVLDL, LDLCALC, CHOLHDL in the last 168  hours.  Hematology Recent Labs  Lab 02/09/24 2334 02/10/24 0012 02/14/24 2011 02/14/24 2104 02/15/24 0505  WBC 12.7*  --  22.5*  --  19.5*  RBC 3.48*  --  3.42*  --  3.12*  HGB 9.9*   < > 9.7* 9.9* 8.8*  HCT 31.1*   < > 30.9* 29.0* 27.9*  MCV 89.4  --  90.4  --  89.4  MCH 28.4  --  28.4  --  28.2  MCHC 31.8  --  31.4  --  31.5  RDW 14.2  --  14.1  --  13.9  PLT 195  --  166  --  147*   < > = values in this interval not displayed.   Thyroid  No results for input(s): TSH, FREET4 in the last 168 hours.  BNP Recent Labs  Lab 02/09/24 2346 02/14/24 2011  BNP 114.4* 105.4*    DDimer No results for input(s): DDIMER in the last 168 hours.  Radiology/Studies:  US  RENAL Result Date: 02/15/2024 EXAM: RETROPERITONEAL ULTRASOUND OF THE KIDNEYS AND URINARY BLADDER TECHNIQUE: Real-time ultrasonography of the retroperitoneum including the kidneys and urinary bladder was performed. COMPARISON: CT abdomen and pelvis dated 01/25/2024. CLINICAL HISTORY: Acute kidney injury (HCC). FINDINGS: RIGHT KIDNEY: The right kidney measures 10.6 x 5.4 x 4.1 cm with a volume of 120 ml. The right kidney demonstrates normal echogenicity. No hydronephrosis or mass. LEFT KIDNEY: The left kidney measures 10.3 x 5.1 x 5.7 cm with a volume of 155 ml. No left hydronephrosis or mass. Increased cortical echogenicity in the left kidney which can be seen with medical renal disease. BLADDER: Unremarkable appearance of the bladder. IMPRESSION: 1. Increased cortical echogenicity in the left kidney, suggestive of medical renal disease. 2. No hydronephrosis or mass in either kidney. Electronically signed by: Norman Gatlin MD 02/15/2024 01:55 AM EDT RP Workstation: [id number]   CT HEAD WO CONTRAST Result Date: 02/14/2024 CLINICAL DATA:  Altered mental status EXAM: CT HEAD WITHOUT CONTRAST TECHNIQUE: Contiguous axial images were obtained from the base of the skull through the vertex without intravenous contrast. RADIATION DOSE  REDUCTION: This exam was performed according to the departmental dose-optimization program which includes automated exposure control, adjustment of the mA and/or kV according to patient size and/or use of iterative reconstruction technique. COMPARISON:  10/23/2022 FINDINGS: Brain: No evidence of acute infarction, hemorrhage, hydrocephalus, extra-axial collection or mass lesion/mass effect. Vascular: No hyperdense vessel or unexpected calcification. Skull: Normal. Negative for fracture or focal lesion. Sinuses/Orbits: No acute finding. Other: None. IMPRESSION: No acute abnormality noted. Electronically Signed   By: Oneil Devonshire M.D.   On: 02/14/2024 21:59  DG Chest Port 1 View Result Date: 02/14/2024 CLINICAL DATA:  Altered mental status. EXAM: PORTABLE CHEST 1 VIEW COMPARISON:  Chest x-ray 02/10/2024 FINDINGS: Enlarged cardiopericardial silhouette vascular congestion. Question trace edema. Film is rotated to left. Films are also under penetrated. No pneumothorax. No large effusion. Overlapping cardiac leads. IMPRESSION: Limited x-ray. Enlarged cardiac silhouette with vascular congestion. Question trace edema. Electronically Signed   By: Ranell Bring M.D.   On: 02/14/2024 19:41     Assessment and Plan:  Elevated Troponin  - Patient denies chest pain, shortness of breath, orthopnea. Denies recent chest pain dyspnea on exertion, though she if fairly inactive. Spends most of her time in bed  - Now admitted with lethargy, acute encephalopathy, SIRS  - hsTn [phone]  - Trend not consistent with ACS. Suspect demand ischemia - Echocardiogram pending. Further recs pending echo   Heart Failure improved EF  HTN  - EF previously as low as 30-35%. Cath in 2017 showed normal coronary arteries so has been treated for nonischemic cardiomyopathy with GDMT. Echo from 09/2022 showed EF 55-60%, mild LVH, normal RV systolic function, normal PA systolic pressure - Echo pending this admission  - Patient reports  some lower extremity swelling. No shortness of breath, orthopnea. She is comfortable on room air. BNP 105, down from 114 a few days prior.  - Creatinine initially 2.20, improved to 1.55 today but is not yet back to baseline. No lasix  for now - BP initially low in the ED. Has improved with IV fluids - Resume home bidil  for now. Plan to resume carvedilol , entresto , spiro as BP tolerates   AKI on CKD  - Baseline creatinine appears to be around 1.2-1.3. Up to 2.20 yesterday  - Improving with gentle IV fluids. Cautious that patient does not become volume overloaded with history of CHF   History of DVT  - Continue IV heparin  with plans to transition back to eliquis  prior to DC   Otherwise per primary  - Acute encephalopathy  - SIRS  - Insulin -dependent type 2 DM  - chronic anemia  - Hypothyroidism  - Chronic back pain  - SLE/Sjogren's syndrome    Risk Assessment/Risk Scores:   [place]  Heart Association (NYHA) Functional Class NYHA Class I- note, patient inactive at baseline     For questions or updates, please contact [facility] Please consult [email or web address] for contact info under    Signed, Rollo FABIENE Louder, PA-C  02/15/2024 10:40 AM

## 2024-02-15 NOTE — Progress Notes (Signed)
 Pharmacy Antibiotic Note  [name] is a 53 y.o. female admitted on 02/14/2024 with sepsis.  Pharmacy has been consulted for Cefepime  + Vancomycin  dosing.  D1 cefepime /vanc SCr improving WBC 19.5 afebrile  Plan: Change cefepime  from 2g q12 to 2g IV q8 for improved SCr Also for improved SCr, change vanc from 1g q24 to 1750mg  q24 - goal AUC 400-550 -Estimated AUC on new regimen = 510 Monitor renal function and cx data   Height: 5' 9 (175.3 cm) Weight: (!) 168 kg (370 lb 6 oz) IBW/kg (Calculated) : 66.2  Temp (24hrs), Avg:98.1 F (36.7 C), Min:97.6 F (36.4 C), Max:98.7 F (37.1 C)  Recent Labs  Lab 02/09/24 2334 02/09/24 2345 02/14/24 2011 02/14/24 2103 02/14/24 2104 02/15/24 0505  WBC 12.7*  --  22.5*  --   --  19.5*  CREATININE  --  1.58* 2.03*  --  2.20* 1.55*  LATICACIDVEN  --   --   --  2.0*  --   --     Estimated Creatinine Clearance: 70.8 mL/min (A) (by C-G formula based on SCr of 1.55 mg/dL (H)).    Allergies  Allergen Reactions   Metoclopramide  Other (See Comments)    Developed restless leg, akathisia type limb movements.    Codeine Itching and Rash   Hydrocodone  Rash   Percocet [Oxycodone -Acetaminophen ] Hives and Rash    Tolerates dilaudid  Tolerates acetaminophen      Antimicrobials this admission: 7/16 Cefepime  >>  7/16 Vancomycin  >>   Dose adjustments this admission:  Microbiology results: 7/16 BCx:  7/16 UCx:   7/16 Resp PCR: negative  Thank you for allowing pharmacy to be a part of this patient's care.  Britta Eva Na PharmD 02/15/2024 9:54 AM

## 2024-02-15 NOTE — Progress Notes (Signed)
 Pharmacy - IV heparin   Assessment: 53 yoF on IV heparin  while Eliquis  on hold, with heparin  level timed for 1930 today. - heparin  turned off at 1400 today for midline placement; resumed just before 1900  Plan: Retime heparin  level 8 hrs from infusion restart  [name], PharmD, BCPS [phone] 02/15/2024, 6:55 PM

## 2024-02-15 NOTE — Progress Notes (Signed)
 [facility]  PROGRESS NOTE  [name] FMW:[id number] DOB: 01/30/1971 DOA: 02/14/2024 PCP: [name], MD   Brief HPI:   a 53 y.o. female with medical history significant for chronic HFpEF, history of VTE on Eliquis , insulin -dependent T2DM, SLE, Sjogren's syndrome, HTN, gastroparesis, hypothyroidism, anemia, chronic back pain, morbid obesity who presented to the ED for evaluation of altered mental status  Patient recently admitted at [facility] from 6/30-7/8 for intractable nausea and vomiting related to gastroparesis. She had associated AKI. She was managed with antiemetics with improvement in nausea vomiting. She was also complaining of acute on chronic back pain. Her Lyrica  was increased from 75 mg to 100 mg. She was started on baclofen, Lidoderm  patch, and given short supply of oral Dilaudid  overnight   Assessment/Plan:   Acute encephalopathy: -Resolved; back to baseline Hypersomnolent and unarousable on arrival.  Likely in setting of polypharmacy-   Recently prescribed short course of Dilaudid , baclofen, and increased Lyrica  dose after hospitalized at [facility].  UDS positive for opiates.  CT head negative for acute changes. - Holding Dilaudid , baclofen, Lyrica , Seroquel  - Follow blood and urine cultures    SIRS: Patient presented with leukocytosis, tachycardia, AKI.   No clear primary infectious source other than buttocks wound with some purulent discharge.  UA negative for UTI.  CXR limited but without obvious pneumonia.  Blood pressure stable without shock appearance. - Continue broad-spectrum empiric antibiotics with vancomycin , cefepime , Flagyl  - Follow blood and urine cultures - Consult to wound care for buttocks wound   Elevated troponin: Initial troponin 857.  EKG without any significant ischemic changes and is otherwise similar to previous other than increased heart rate.  Patient is denying chest pain. -Repeat troponin elevated   1370 -Will consult cardiology   Acute kidney injury: Creatinine 2.03 on admission.  Recent baseline seems closer to 1.5. - Obtained renal ultrasound which showed no hydronephrosis - Monitor UOP, bladder scan as needed - Holding spironolactone , metformin , Jardiance , Entresto    Chronic HFpEF: Last EF 55-60% by TTE 01/04/2023.  Morbidly obese body habitus limits volume assessment however overall does not appear significantly volume overloaded.  Mucous membranes are very dry. - Holding spironolactone , Jardiance , Entresto    History of DVT: Started on heparin    Insulin -dependent type 2 diabetes: Holding metformin  and Jardiance .  Placed on SSI.   Chronic normocytic anemia: Hemoglobin relatively stable.  Continue monitor.   Hypothyroidism: Continue Synthroid .   Chronic back pain: Lyrica  on hold as above.   SLE/Sjogren's syndrome: Holding Plaquenil .   Mood disorder: Meds on hold.      Medications     collagenase    Topical Daily   insulin  aspart  0-5 Units Subcutaneous QHS   insulin  aspart  0-9 Units Subcutaneous TID WC   levothyroxine   50 mcg Oral Q0600   naLOXone  (NARCAN )  injection  0.4 mg Intravenous Once   sodium chloride  flush  3 mL Intravenous Q12H     Data Reviewed:   CBG:  Recent Labs  Lab 02/10/24 0051 02/14/24 1832 02/15/24 0133  GLUCAP 168* 238* 179*    SpO2: 98 %    Vitals:   02/15/24 0400 02/15/24 0500 02/15/24 0600 02/15/24 0810  BP: 132/68 135/75 134/77   Pulse: 94 98 (!) 107   Resp: 14 18 19    Temp:   97.8 F (36.6 C) 98.7 F (37.1 C)  TempSrc:   Axillary Oral  SpO2: 99% 99% 98%   Weight:    (!) 168 kg  Height:  5' 9 (1.753 m)      Data Reviewed:  Basic Metabolic Panel: Recent Labs  Lab 02/09/24 2345 02/10/24 0012 02/14/24 2011 02/14/24 2104 02/15/24 0505  NA 142 145 140 140 139  K 4.2 3.8 4.1 3.9 3.7  CL 112*  --  108 108 110  CO2 19*  --  21*  --  22  GLUCOSE 219*  --  216* 211* 190*  BUN 39*  --  39* 34* 33*   CREATININE 1.58*  --  2.03* 2.20* 1.55*  CALCIUM  9.6  --  9.4  --  8.7*    CBC: Recent Labs  Lab 02/09/24 2334 02/10/24 0012 02/14/24 2011 02/14/24 2104 02/15/24 0505  WBC 12.7*  --  22.5*  --  19.5*  NEUTROABS  --   --  18.5*  --   --   HGB 9.9* 10.5* 9.7* 9.9* 8.8*  HCT 31.1* 31.0* 30.9* 29.0* 27.9*  MCV 89.4  --  90.4  --  89.4  PLT 195  --  166  --  147*    LFT Recent Labs  Lab 02/09/24 2345 02/14/24 2011 02/15/24 0505  AST 38 27 22  ALT 18 20 15   ALKPHOS 64 74 58  BILITOT 0.8 1.4* 1.5*  PROT 7.2 6.9 5.5*  ALBUMIN  3.7 3.5 2.8*     Antibiotics: Anti-infectives (From admission, onward)    Start     Dose/Rate Route Frequency Ordered Stop   02/15/24 2200  vancomycin  (VANCOCIN ) IVPB 1000 mg/200 mL premix        1,000 mg 200 mL/hr over 60 Minutes Intravenous Every 24 hours 02/14/24 2345     02/15/24 0900  ceFEPIme  (MAXIPIME ) 2 g in sodium chloride  0.9 % 100 mL IVPB        2 g 200 mL/hr over 30 Minutes Intravenous Every 12 hours 02/14/24 2345     02/14/24 2345  metroNIDAZOLE  (FLAGYL ) IVPB 500 mg        500 mg 100 mL/hr over 60 Minutes Intravenous 2 times daily 02/14/24 2334 02/21/24 2159   02/14/24 2115  vancomycin  (VANCOREADY) IVPB 2000 mg/400 mL        2,000 mg 200 mL/hr over 120 Minutes Intravenous  Once 02/14/24 2110 02/15/24 0238   02/14/24 2115  ceFEPIme  (MAXIPIME ) 2 g in sodium chloride  0.9 % 100 mL IVPB        2 g 200 mL/hr over 30 Minutes Intravenous  Once 02/14/24 2110 02/14/24 2235        DVT prophylaxis: Heparin   Code Status: Full code  Family Communication: No family at bedside   CONSULTS    Subjective   Denies chest pain.  Troponin went up to 1210.   Objective    Physical Examination:  General-appears in no acute distress Heart-S1-S2, regular, no murmur auscultated Lungs-clear to auscultation bilaterally, no wheezing or crackles auscultated Abdomen-soft, nontender, no organomegaly Extremities-no edema in the lower  extremities Neuro-alert, oriented x3, no focal deficit noted  Status is: Inpatient:             [name]   [facility] If 7PM-7AM, please contact night-coverage at [email or web address], Office  [phone]   02/15/2024, 8:14 AM  LOS: 1 day

## 2024-02-15 NOTE — Progress Notes (Signed)
 PHARMACY - ANTICOAGULATION CONSULT NOTE  Pharmacy Consult for Heparin  Indication: hx DVT  Allergies  Allergen Reactions   Metoclopramide  Other (See Comments)    Developed restless leg, akathisia type limb movements.    Codeine Itching and Rash   Hydrocodone  Rash   Percocet [Oxycodone -Acetaminophen ] Hives and Rash    Tolerates dilaudid , Tolerates acetaminophen      Patient Measurements: Height: 5' 9 (175.3 cm) Weight: (!) 168 kg (370 lb 6 oz) IBW/kg (Calculated) : 66.2 HEPARIN  DW (KG): 108.3  Vital Signs: Temp: 98.7 F (37.1 C) (07/17 0810) Temp Source: Oral (07/17 0810) BP: 122/67 (07/17 1200) Pulse Rate: 107 (07/17 1200)  Labs: Recent Labs    02/14/24 2011 02/14/24 2104 02/15/24 0038 02/15/24 0234 02/15/24 0505 02/15/24 1214  HGB 9.7* 9.9*  --   --  8.8*  --   HCT 30.9* 29.0*  --   --  27.9*  --   PLT 166  --   --   --  147*  --   APTT  --   --   --  24  --   --   LABPROT  --   --   --  17.0*  --   --   INR  --   --   --  1.3*  --   --   HEPARINUNFRC  --   --   --  <0.10*  --  0.29*  CREATININE 2.03* 2.20*  --   --  1.55*  --   TROPONINIHS 857*  --  1,370*  --  1,210*  --     Estimated Creatinine Clearance: 70.8 mL/min (A) (by C-G formula based on SCr of 1.55 mg/dL (H)).   Medical History: Past Medical History:  Diagnosis Date   Acute renal failure (HCC)     Intractable nausea vomiting secondary to diabetic gastroparesis causing dehydration and acute renal failure /notes 04/01/2013   Anginal pain (HCC)    Anxiety    Bell's palsy 08/09/2010   Bicornuate uterus    CAP (community acquired pneumonia) 10/2011   thelbert 11/08/2011   Chest pain 01/13/2014   CHF (congestive heart failure) (HCC)    Diabetic gastroparesis associated with type 2 diabetes mellitus (HCC)    this is presumed diagnoses, not confirmed by any studies.    Eczema    Family history of breast cancer    Family history of malignant neoplasm of breast    Family history of malignant neoplasm  of ovary    Family history of ovarian cancer    Family history of prostate cancer    GERD (gastroesophageal reflux disease)    High cholesterol    Hypertension    Liver lesion 08/13/2019   Liver mass 2014   biopsied 03/2013 at [facility], not malignant.  is to undergo radiologic ablation of the mass in sept/October 2014.    Lupus    Migraines    maybe a couple times/yr (04/01/2013)   Obesity    Obstructive sleep apnea on CPAP 2011   Oxygen at nights   Pulmonary embolism (HCC) 05/21/2021   SJOGREN'S SYNDROME 08/09/2010   SLE (systemic lupus erythematosus) (HCC)    thelbert 12/01/2010    Medications:  Infusions:   ceFEPime  (MAXIPIME ) IV Stopped (02/15/24 1034)   heparin  1,400 Units/hr (02/15/24 1159)   metronidazole  Stopped (02/15/24 1153)   vancomycin       Assessment: 53 yo F with hx VTE on chronic Eliquis  for VTE prevention.   Last dose Eliquis  unknown. AKI -  Scr 2.2, CBC- Hg low at 9.9, pltc WNL.  Troponin increasing. Baseline heparin  level = <0.1.  Heparin  level just below goal of 0.3-0.7 on heparin  rate of 1400 units/hr CBC: Hgb 8.8, Plts 147 Per RN, no bleeding or issues noted  Goal of Therapy:  Heparin  level 0.3-0.7 units/ml aPTT 66-102 seconds Monitor platelets by anticoagulation protocol: Yes   Plan:  Increase IV heparin  from 1400 to 1600 units/hr Recheck 8h heparin  level Daily heparin  level & CBC while on heparin  F/U plans to resume apixaban     Eva CHRISTELLA Allis, PharmD, BCPS Secure Chat if ?s 02/15/2024 12:46 PM

## 2024-02-15 NOTE — Progress Notes (Addendum)
  Echocardiogram 2D Echocardiogram has been performed. Results shared with cardiology.   Devora Ellouise SAUNDERS 02/15/2024, 10:45 AM

## 2024-02-15 NOTE — Consult Note (Addendum)
 WOC Nurse Consult Note: Consult requested for buttocks.  Pt has a high BMI and was incontinent prior to admission.  She currently has a foley to contain urine. She is critically ill with multiple systemic factors which can impair healing and is on a low airloss mattress to decrease pressure.  Left upper inner buttocks with Unstageable pressure injury; 6X6X.1cm, 100% yellow moist slough, mod amt tan drainage.  Bilat buttocks with patchy areas of red moist macerated moisture associated skin damage.  Right upper inner buttocks with red moist painful wounds related to moisture; 8X10X.2cm, painful to touch, mod amt tan drainage.  Abd and bilat breast folds are red moist and macerated with large amt tan foul smelling drainage.  Appearance is consistent with Intertrigo. Lower abd folds are red, moist and painful with full thickness skin loss.  Pressure Injury POA: Yes Dressing procedure/placement/frequency: Topical treatment orders provided for bedside nurses to perform as follows to promote healing:  1. Apply Santyl  to left buttock wound Q day, then cover with moist gauze and foam dressing.  Change foam dressing Q 3 days or PRN soiling 2. Foam dressing to right buttock.Change foam dressing Q 3 days or PRN soiling 3. Interdry to abd and breast skin folds; refer to orders provided.  Please re-consult if further assistance is needed.  Thank-you,  Stephane Fought MSN, RN, CWOCN, CWCN-AP, CNS Contact Mon-Fri 0700-1500: [phone]

## 2024-02-16 ENCOUNTER — Encounter (HOSPITAL_COMMUNITY): Payer: Self-pay | Admitting: Internal Medicine

## 2024-02-16 DIAGNOSIS — N179 Acute kidney failure, unspecified: Secondary | ICD-10-CM | POA: Diagnosis not present

## 2024-02-16 DIAGNOSIS — G9340 Encephalopathy, unspecified: Secondary | ICD-10-CM | POA: Diagnosis not present

## 2024-02-16 DIAGNOSIS — R7989 Other specified abnormal findings of blood chemistry: Secondary | ICD-10-CM | POA: Diagnosis not present

## 2024-02-16 DIAGNOSIS — R4182 Altered mental status, unspecified: Secondary | ICD-10-CM | POA: Diagnosis not present

## 2024-02-16 DIAGNOSIS — I5021 Acute systolic (congestive) heart failure: Secondary | ICD-10-CM

## 2024-02-16 LAB — COMPREHENSIVE METABOLIC PANEL WITH GFR
ALT: 13 U/L (ref 0–44)
AST: 18 U/L (ref 15–41)
Albumin: 2.4 g/dL — ABNORMAL LOW (ref 3.5–5.0)
Alkaline Phosphatase: 53 U/L (ref 38–126)
Anion gap: 7 (ref 5–15)
BUN: 21 mg/dL — ABNORMAL HIGH (ref 6–20)
CO2: 21 mmol/L — ABNORMAL LOW (ref 22–32)
Calcium: 7.9 mg/dL — ABNORMAL LOW (ref 8.9–10.3)
Chloride: 108 mmol/L (ref 98–111)
Creatinine, Ser: 1.08 mg/dL — ABNORMAL HIGH (ref 0.44–1.00)
GFR, Estimated: >60 mL/min (ref >60)
Glucose, Bld: 226 mg/dL — ABNORMAL HIGH (ref 70–99)
Potassium: 3.2 mmol/L — ABNORMAL LOW (ref 3.5–5.1)
Sodium: 136 mmol/L (ref 135–145)
Total Bilirubin: 1.3 mg/dL — ABNORMAL HIGH (ref 0.0–1.2)
Total Protein: 5.2 g/dL — ABNORMAL LOW (ref 6.5–8.1)

## 2024-02-16 LAB — GLUCOSE, CAPILLARY
Glucose-Capillary: 169 mg/dL — ABNORMAL HIGH (ref 70–99)
Glucose-Capillary: 172 mg/dL — ABNORMAL HIGH (ref 70–99)
Glucose-Capillary: 253 mg/dL — ABNORMAL HIGH (ref 70–99)
Glucose-Capillary: 154 mg/dL — ABNORMAL HIGH (ref 70–99)

## 2024-02-16 LAB — CBC
HCT: 25.6 % — ABNORMAL LOW (ref 36.0–46.0)
Hemoglobin: 8 g/dL — ABNORMAL LOW (ref 12.0–15.0)
MCH: 28.3 pg (ref 26.0–34.0)
MCHC: 31.3 g/dL (ref 30.0–36.0)
MCV: 90.5 fL (ref 80.0–100.0)
Platelets: 143 K/uL — ABNORMAL LOW (ref 150–400)
RBC: 2.83 MIL/uL — ABNORMAL LOW (ref 3.87–5.11)
RDW: 14.1 % (ref 11.5–15.5)
WBC: 14.1 K/uL — ABNORMAL HIGH (ref 4.0–10.5)
nRBC: 0 % (ref 0.0–0.2)

## 2024-02-16 LAB — URINE CULTURE: Culture: 20000 — AB

## 2024-02-16 LAB — HEPARIN LEVEL (UNFRACTIONATED)
Heparin Unfractionated: 0.31 [IU]/mL (ref 0.30–0.70)
Heparin Unfractionated: 0.32 [IU]/mL (ref 0.30–0.70)

## 2024-02-16 MED ORDER — APIXABAN 2.5 MG PO TABS
2.5000 mg | ORAL_TABLET | Freq: Two times a day (BID) | ORAL | Status: DC
  Administered 2024-02-16 – 2024-02-29 (×26): 2.5 mg via ORAL
  Filled 2024-02-16 (×26): qty 1

## 2024-02-16 MED ORDER — POTASSIUM CHLORIDE CRYS ER 20 MEQ PO TBCR
40.0000 meq | EXTENDED_RELEASE_TABLET | Freq: Once | ORAL | Status: AC
  Administered 2024-02-16: 40 meq via ORAL
  Filled 2024-02-16: qty 2

## 2024-02-16 MED ORDER — METOPROLOL SUCCINATE ER 50 MG PO TB24
25.0000 mg | ORAL_TABLET | Freq: Every day | ORAL | Status: DC
  Administered 2024-02-17 – 2024-02-29 (×11): 25 mg via ORAL
  Filled 2024-02-16 (×12): qty 1

## 2024-02-16 MED ORDER — VANCOMYCIN HCL 1250 MG/250ML IV SOLN
1250.0000 mg | Freq: Two times a day (BID) | INTRAVENOUS | Status: DC
  Administered 2024-02-16 – 2024-02-17 (×2): 1250 mg via INTRAVENOUS
  Filled 2024-02-16 (×2): qty 250

## 2024-02-16 MED ORDER — SACUBITRIL-VALSARTAN 24-26 MG PO TABS
1.0000 | ORAL_TABLET | Freq: Two times a day (BID) | ORAL | Status: DC
  Administered 2024-02-17 (×2): 1 via ORAL
  Filled 2024-02-16 (×3): qty 1

## 2024-02-16 NOTE — Progress Notes (Signed)
 [facility]  PROGRESS NOTE  [name] FMW:[id number] DOB: 17-Sep-1970 DOA: 02/14/2024 PCP: [name], MD   Brief HPI:   a 53 y.o. female with medical history significant for chronic HFpEF, history of VTE on Eliquis , insulin -dependent T2DM, SLE, Sjogren's syndrome, HTN, gastroparesis, hypothyroidism, anemia, chronic back pain, morbid obesity who presented to the ED for evaluation of altered mental status  Patient recently admitted at [facility] from 6/30-7/8 for intractable nausea and vomiting related to gastroparesis. She had associated AKI. She was managed with antiemetics with improvement in nausea vomiting. She was also complaining of acute on chronic back pain. Her Lyrica  was increased from 75 mg to 100 mg. She was started on baclofen, Lidoderm  patch, and given short supply of oral Dilaudid  overnight   Assessment/Plan:   Acute encephalopathy: -Resolved; back to baseline Hypersomnolent and unarousable on arrival.  Likely in setting of polypharmacy-   Recently prescribed short course of Dilaudid , baclofen, and increased Lyrica  dose after hospitalized at [facility].  UDS positive for opiates.  CT head negative for acute changes. - Holding Dilaudid , baclofen, Lyrica , Seroquel  - Follow blood and urine cultures    SIRS: Patient presented with leukocytosis, tachycardia, AKI.   No clear primary infectious source other than buttocks wound with some purulent discharge.  UA negative for UTI.  CXR limited but without obvious pneumonia.  Blood pressure stable without shock appearance. - She was started on broad-spectrum empiric antibiotics with vancomycin , cefepime , Flagyl  - Blood cultures 1 set growing gram-negative rods -Urine culture growing E. coli, -Continue empiric antibiotics for now till blood final culture is resulted - Consulted  wound care for buttocks wound   Elevated troponin: Initial troponin 857.  EKG without any significant ischemic changes and  is otherwise similar to previous other than increased heart rate.  Patient is denying chest pain. -Repeat troponin elevated  1370 - Cardiology consulted -No plan for cardiac catheter at this time.   Acute kidney injury: Creatinine 2.03 on admission.  Recent baseline seems closer to 1.5. - Obtained renal ultrasound which showed no hydronephrosis - Monitor UOP, bladder scan as needed - Holding spironolactone , metformin , Jardiance , Entresto    Chronic HFpEF: Last EF 55-60% by TTE 01/04/2023.  Morbidly obese body habitus limits volume assessment however overall does not appear significantly volume overloaded.  Mucous membranes are very dry. - Holding spironolactone , Jardiance , Entresto  -Repeat echocardiogram done on 02/15/2024 showed EF of 35 to 40% -GDMT to be started as per cardiology   History of DVT: Started on heparin  -Will discontinue heparin  and start Eliquis    Insulin -dependent type 2 diabetes: Holding metformin  and Jardiance .  Placed on SSI.   Chronic normocytic anemia: Hemoglobin relatively stable.  Continue monitor.   Hypothyroidism: Continue Synthroid .   Chronic back pain: Lyrica  on hold as above.   SLE/Sjogren's syndrome: Holding Plaquenil .   Mood disorder: Meds on hold.    Hypokalemia - Potassium is 3.2 - Replace potassium and follow BMP in am  Medications     Chlorhexidine  Gluconate Cloth  6 each Topical Daily   collagenase    Topical Daily   feeding supplement  237 mL Oral BID BM   insulin  aspart  0-5 Units Subcutaneous QHS   insulin  aspart  0-9 Units Subcutaneous TID WC   isosorbide -hydrALAZINE   1 tablet Oral TID   levothyroxine   50 mcg Oral Q0600   naLOXone  (NARCAN )  injection  0.4 mg Intravenous Once   sodium chloride  flush  10-40 mL Intracatheter Q12H   sodium chloride  flush  3 mL Intravenous Q12H     Data Reviewed:   CBG:  Recent Labs  Lab 02/15/24 0133 02/15/24 0826 02/15/24 1222 02/15/24 1547 02/15/24 2158  GLUCAP 179* 174* 166* 204*  196*    SpO2: 98 %    Vitals:   02/16/24 0400 02/16/24 0500 02/16/24 0600 02/16/24 0700  BP: (!) 103/54 (!) 116/40 (!) 126/59 (!) 131/59  Pulse: 87 83 94 89  Resp: 15 13 19 16   Temp: 98.3 F (36.8 C)     TempSrc: Oral     SpO2: 99% 99% 99% 98%  Weight:  (!) 168.6 kg    Height:          Data Reviewed:  Basic Metabolic Panel: Recent Labs  Lab 02/09/24 2345 02/10/24 0012 02/14/24 2011 02/14/24 2104 02/15/24 0505 02/16/24 0245  NA 142 145 140 140 139 136  K 4.2 3.8 4.1 3.9 3.7 3.2*  CL 112*  --  108 108 110 108  CO2 19*  --  21*  --  22 21*  GLUCOSE 219*  --  216* 211* 190* 226*  BUN 39*  --  39* 34* 33* 21*  CREATININE 1.58*  --  2.03* 2.20* 1.55* 1.08*  CALCIUM  9.6  --  9.4  --  8.7* 7.9*    CBC: Recent Labs  Lab 02/09/24 2334 02/10/24 0012 02/14/24 2011 02/14/24 2104 02/15/24 0505 02/16/24 0245  WBC 12.7*  --  22.5*  --  19.5* 14.1*  NEUTROABS  --   --  18.5*  --   --   --   HGB 9.9* 10.5* 9.7* 9.9* 8.8* 8.0*  HCT 31.1* 31.0* 30.9* 29.0* 27.9* 25.6*  MCV 89.4  --  90.4  --  89.4 90.5  PLT 195  --  166  --  147* 143*    LFT Recent Labs  Lab 02/09/24 2345 02/14/24 2011 02/15/24 0505 02/16/24 0245  AST 38 27 22 18   ALT 18 20 15 13   ALKPHOS 64 74 58 53  BILITOT 0.8 1.4* 1.5* 1.3*  PROT 7.2 6.9 5.5* 5.2*  ALBUMIN  3.7 3.5 2.8* 2.4*     Antibiotics: Anti-infectives (From admission, onward)    Start     Dose/Rate Route Frequency Ordered Stop   02/15/24 2200  vancomycin  (VANCOCIN ) IVPB 1000 mg/200 mL premix  Status:  Discontinued        1,000 mg 200 mL/hr over 60 Minutes Intravenous Every 24 hours 02/14/24 2345 02/15/24 0958   02/15/24 2200  vancomycin  (VANCOREADY) IVPB 1750 mg/350 mL        1,750 mg 175 mL/hr over 120 Minutes Intravenous Every 24 hours 02/15/24 0958     02/15/24 1000  ceFEPIme  (MAXIPIME ) 2 g in sodium chloride  0.9 % 100 mL IVPB        2 g 200 mL/hr over 30 Minutes Intravenous Every 8 hours 02/15/24 0958     02/15/24 0900   ceFEPIme  (MAXIPIME ) 2 g in sodium chloride  0.9 % 100 mL IVPB  Status:  Discontinued        2 g 200 mL/hr over 30 Minutes Intravenous Every 12 hours 02/14/24 2345 02/15/24 0958   02/14/24 2345  metroNIDAZOLE  (FLAGYL ) IVPB 500 mg        500 mg 100 mL/hr over 60 Minutes Intravenous 2 times daily 02/14/24 2334 02/21/24 2159   02/14/24 2115  vancomycin  (VANCOREADY) IVPB 2000 mg/400 mL        2,000 mg 200 mL/hr over 120 Minutes Intravenous  Once 02/14/24 2110  02/15/24 0238   02/14/24 2115  ceFEPIme  (MAXIPIME ) 2 g in sodium chloride  0.9 % 100 mL IVPB        2 g 200 mL/hr over 30 Minutes Intravenous  Once 02/14/24 2110 02/14/24 2235        DVT prophylaxis: Heparin   Code Status: Full code  Family Communication: No family at bedside   CONSULTS    Subjective    Denies chest pain or shortness of breath.  Objective    Physical Examination:  General-appears in no acute distress Heart-S1-S2, regular, no murmur auscultated Lungs-clear to auscultation bilaterally, no wheezing or crackles auscultated Abdomen-soft, nontender, no organomegaly Extremities-no edema in the lower extremities Neuro-alert, oriented x3, no focal deficit noted  Status is: Inpatient:             [name]   [facility] If 7PM-7AM, please contact night-coverage at [email or web address], Office  [phone]   02/16/2024, 7:49 AM  LOS: 2 days

## 2024-02-16 NOTE — Plan of Care (Signed)
  Problem: Fluid Volume: Goal: Hemodynamic stability will improve Outcome: Progressing   Problem: Clinical Measurements: Goal: Diagnostic test results will improve Outcome: Progressing Goal: Signs and symptoms of infection will decrease Outcome: Progressing   Problem: Respiratory: Goal: Ability to maintain adequate ventilation will improve Outcome: Progressing   Problem: Education: Goal: Ability to describe self-care measures that may prevent or decrease complications (Diabetes Survival Skills Education) will improve Outcome: Progressing   Problem: Coping: Goal: Ability to adjust to condition or change in health will improve Outcome: Progressing   Problem: Fluid Volume: Goal: Ability to maintain a balanced intake and output will improve Outcome: Progressing   Problem: Health Behavior/Discharge Planning: Goal: Ability to identify and utilize available resources and services will improve Outcome: Progressing Goal: Ability to manage health-related needs will improve Outcome: Progressing   Problem: Metabolic: Goal: Ability to maintain appropriate glucose levels will improve Outcome: Progressing   Problem: Nutritional: Goal: Maintenance of adequate nutrition will improve Outcome: Progressing Goal: Progress toward achieving an optimal weight will improve Outcome: Progressing   Problem: Skin Integrity: Goal: Risk for impaired skin integrity will decrease Outcome: Progressing   Problem: Tissue Perfusion: Goal: Adequacy of tissue perfusion will improve Outcome: Progressing   Problem: Education: Goal: Knowledge of General Education information will improve Description: Including pain rating scale, medication(s)/side effects and non-pharmacologic comfort measures Outcome: Progressing   Problem: Health Behavior/Discharge Planning: Goal: Ability to manage health-related needs will improve Outcome: Progressing   Problem: Clinical Measurements: Goal: Ability to maintain  clinical measurements within normal limits will improve Outcome: Progressing Goal: Will remain free from infection Outcome: Progressing Goal: Diagnostic test results will improve Outcome: Progressing Goal: Respiratory complications will improve Outcome: Progressing Goal: Cardiovascular complication will be avoided Outcome: Progressing   Problem: Activity: Goal: Risk for activity intolerance will decrease Outcome: Progressing   Problem: Nutrition: Goal: Adequate nutrition will be maintained Outcome: Progressing   Problem: Coping: Goal: Level of anxiety will decrease Outcome: Progressing   Problem: Elimination: Goal: Will not experience complications related to bowel motility Outcome: Progressing Goal: Will not experience complications related to urinary retention Outcome: Progressing   Problem: Pain Managment: Goal: General experience of comfort will improve and/or be controlled Outcome: Progressing   Problem: Safety: Goal: Ability to remain free from injury will improve Outcome: Progressing   Problem: Skin Integrity: Goal: Risk for impaired skin integrity will decrease Outcome: Progressing   Cindy S. Loreli BSN, RN, CCRP, CCRN 02/16/2024 2:03 AM

## 2024-02-16 NOTE — Progress Notes (Signed)
 PHARMACY - ANTICOAGULATION CONSULT NOTE  Pharmacy Consult for Heparin  Indication: hx DVT (apixaban  on hold), ?ACS  Allergies  Allergen Reactions   Metoclopramide  Other (See Comments)    Developed restless leg, akathisia type limb movements.    Codeine Itching and Rash   Hydrocodone  Rash   Percocet [Oxycodone -Acetaminophen ] Hives and Rash    Tolerates dilaudid , Tolerates acetaminophen      Patient Measurements: Height: 5' 9 (175.3 cm) Weight: (!) 168.6 kg (371 lb 11.1 oz) IBW/kg (Calculated) : 66.2 HEPARIN  DW (KG): 108.3  Vital Signs: Temp: 98.3 F (36.8 C) (07/18 0400) Temp Source: Oral (07/18 0400) BP: 107/61 (07/18 0900) Pulse Rate: 103 (07/18 0900)  Labs: Recent Labs    02/14/24 2011 02/14/24 2104 02/15/24 0038 02/15/24 0234 02/15/24 0505 02/15/24 1214 02/16/24 0245  HGB 9.7* 9.9*  --   --  8.8*  --  8.0*  HCT 30.9* 29.0*  --   --  27.9*  --  25.6*  PLT 166  --   --   --  147*  --  143*  APTT  --   --   --  24  --   --   --   LABPROT  --   --   --  17.0*  --   --   --   INR  --   --   --  1.3*  --   --   --   HEPARINUNFRC  --   --   --  <0.10*  --  0.29* 0.31  CREATININE 2.03* 2.20*  --   --  1.55*  --  1.08*  TROPONINIHS 857*  --  1,370*  --  1,210*  --   --     Estimated Creatinine Clearance: 101.9 mL/min (A) (by C-G formula based on SCr of 1.08 mg/dL (H)).   Medical History: Past Medical History:  Diagnosis Date   Acute renal failure (HCC)     Intractable nausea vomiting secondary to diabetic gastroparesis causing dehydration and acute renal failure /notes 04/01/2013   Anginal pain (HCC)    Anxiety    Bell's palsy 08/09/2010   Bicornuate uterus    CAP (community acquired pneumonia) 10/2011   thelbert 11/08/2011   Chest pain 01/13/2014   CHF (congestive heart failure) (HCC)    Diabetic gastroparesis associated with type 2 diabetes mellitus (HCC)    this is presumed diagnoses, not confirmed by any studies.    Eczema    Family history of breast cancer     Family history of malignant neoplasm of breast    Family history of malignant neoplasm of ovary    Family history of ovarian cancer    Family history of prostate cancer    GERD (gastroesophageal reflux disease)    High cholesterol    Hypertension    Liver lesion 08/13/2019   Liver mass 2014   biopsied 03/2013 at [facility], not malignant.  is to undergo radiologic ablation of the mass in sept/October 2014.    Lupus    Migraines    maybe a couple times/yr (04/01/2013)   Obesity    Obstructive sleep apnea on CPAP 2011   Oxygen at nights   Pulmonary embolism (HCC) 05/21/2021   SJOGREN'S SYNDROME 08/09/2010   SLE (systemic lupus erythematosus) (HCC)    thelbert 12/01/2010    Medications: Apixaban  2.5 mg PO BID PTA -Pt reported recent non-compliance due to illness  Assessment: Pt is a 27 yoF presenting with AMS. Pt prescribed apixaban  for history  of DVT, last dose unknown. Pharmacy consulted to dose heparin .   -Baseline heparin  level undetectable, monitor using heparin  level  02/16/2024: Confirmatory Heparin  level = 0.32 remains therapeutic at 1600 units/hr CBC: Hgb, Plt both low No complications or bleeding reported  Goal of Therapy:  Heparin  level 0.3-0.7 Monitor platelets by anticoagulation protocol: Yes   Plan:  Continue heparin  infusion at 1600 units/hr Daily heparin  level & CBC Monitor for signs of bleeding F/U plans to resume apixaban   Ronal CHRISTELLA Rav, PharmD 02/16/24 9:27 AM

## 2024-02-16 NOTE — Progress Notes (Addendum)
 Rounding Note   Patient Name: [name] Date of Encounter: 02/16/2024  [facility] Cardiologist: Gordy Bergamo, MD   Subjective Feeling better.  No cp/sob.   Scheduled Meds:  Chlorhexidine  Gluconate Cloth  6 each Topical Daily   collagenase    Topical Daily   feeding supplement  237 mL Oral BID BM   insulin  aspart  0-5 Units Subcutaneous QHS   insulin  aspart  0-9 Units Subcutaneous TID WC   isosorbide -hydrALAZINE   1 tablet Oral TID   levothyroxine   50 mcg Oral Q0600   naLOXone  (NARCAN )  injection  0.4 mg Intravenous Once   sodium chloride  flush  10-40 mL Intracatheter Q12H   sodium chloride  flush  3 mL Intravenous Q12H   Continuous Infusions:  ceFEPime  (MAXIPIME ) IV Stopped (02/16/24 0923)   heparin  1,600 Units/hr (02/16/24 1046)   metronidazole  100 mL/hr at 02/16/24 1046   vancomycin      PRN Meds: acetaminophen  **OR** acetaminophen , bisacodyl , morphine  injection, prochlorperazine , senna-docusate, sodium chloride  flush, traMADol    Vital Signs  Vitals:   02/16/24 0700 02/16/24 0800 02/16/24 0900 02/16/24 1000  BP: (!) 131/59 (!) 109/56 107/61 100/79  Pulse: 89 91 (!) 103 (!) 108  Resp: 16 16 17  (!) 21  Temp:  98.9 F (37.2 C)    TempSrc:  Oral    SpO2: 98% 97% 99% 99%  Weight:      Height:        Intake/Output Summary (Last 24 hours) at 02/16/2024 1117 Last data filed at 02/16/2024 1046 Gross per 24 hour  Intake 1525.81 ml  Output 850 ml  Net 675.81 ml      02/16/2024    5:00 AM 02/15/2024    8:10 AM 02/14/2024    6:47 PM  Last 3 Weights  Weight (lbs) 371 lb 11.1 oz 370 lb 6 oz 330 lb 11 oz  Weight (kg) 168.6 kg 168 kg 150 kg      Telemetry Sinus rhythm. Sinus tachycardia.  Short runs of SVT - Personally Reviewed  ECG  N/a - Personally Reviewed  Physical Exam  VS:  BP 100/79   Pulse (!) 108   Temp 98.9 F (37.2 C) (Oral)   Resp (!) 21   Ht 5' 9 (1.753 m)   Wt (!) 168.6 kg   LMP 03/24/2017   SpO2 99%   BMI 54.89 kg/m  , BMI Body  mass index is 54.89 kg/m. GENERAL:  Well appearing HEENT: Pupils equal round and reactive, fundi not visualized, oral mucosa unremarkable NECK:  No jugular venous distention, waveform within normal limits, carotid upstroke brisk and symmetric, no bruits, no thyromegaly LUNGS:  Clear to auscultation bilaterally HEART:  RRR.  PMI not displaced or sustained,S1 and S2 within normal limits, no S3, no S4, no clicks, no rubs, no murmurs ABD:  Flat, positive bowel sounds normal in frequency in pitch, no bruits, no rebound, no guarding, no midline pulsatile mass, no hepatomegaly, no splenomegaly EXT:  2 plus pulses throughout, 2+ LE edema, no cyanosis no clubbing SKIN:  No rashes no nodules NEURO:  Cranial nerves II through XII grossly intact, motor grossly intact throughout PSYCH:  Cognitively intact, oriented to person place and time    Labs High Sensitivity Troponin:   Recent Labs  Lab 02/14/24 2011 02/15/24 0038 02/15/24 0505  TROPONINIHS 857* 1,370* 1,210*     Chemistry Recent Labs  Lab 02/14/24 2011 02/14/24 2104 02/15/24 0505 02/16/24 0245  NA 140 140 139 136  K 4.1 3.9 3.7 3.2*  CL 108 108 110 108  CO2 21*  --  22 21*  GLUCOSE 216* 211* 190* 226*  BUN 39* 34* 33* 21*  CREATININE 2.03* 2.20* 1.55* 1.08*  CALCIUM  9.4  --  8.7* 7.9*  PROT 6.9  --  5.5* 5.2*  ALBUMIN  3.5  --  2.8* 2.4*  AST 27  --  22 18  ALT 20  --  15 13  ALKPHOS 74  --  58 53  BILITOT 1.4*  --  1.5* 1.3*  GFRNONAA 29*  --  40* >60  ANIONGAP 11  --  7 7    Lipids No results for input(s): CHOL, TRIG, HDL, LABVLDL, LDLCALC, CHOLHDL in the last 168 hours.  Hematology Recent Labs  Lab 02/14/24 2011 02/14/24 2104 02/15/24 0505 02/16/24 0245  WBC 22.5*  --  19.5* 14.1*  RBC 3.42*  --  3.12* 2.83*  HGB 9.7* 9.9* 8.8* 8.0*  HCT 30.9* 29.0* 27.9* 25.6*  MCV 90.4  --  89.4 90.5  MCH 28.4  --  28.2 28.3  MCHC 31.4  --  31.5 31.3  RDW 14.1  --  13.9 14.1  PLT 166  --  147* 143*   Thyroid   No results for input(s): TSH, FREET4 in the last 168 hours.  BNP Recent Labs  Lab 02/09/24 2346 02/14/24 2011  BNP 114.4* 105.4*    DDimer No results for input(s): DDIMER in the last 168 hours.   Radiology  US  EKG SITE RITE Result Date: 02/15/2024 If Site Rite image not attached, placement could not be confirmed due to current cardiac rhythm.  ECHOCARDIOGRAM COMPLETE Result Date: 02/15/2024    ECHOCARDIOGRAM REPORT   Patient Name:   [name] Date of Exam: 02/15/2024 Medical Rec #:  [id number]     Height:       69.0 in Accession #:    [id number]    Weight:       330.7 lb Date of Birth:  1971-06-10     BSA:          2.558 m Patient Age:    53 years      BP:           134/77 mmHg Patient Gender: F             HR:           103 bpm. Exam Location:  Inpatient Procedure: 2D Echo, Cardiac Doppler, Color Doppler and Intracardiac            Opacification Agent (Both Spectral and Color Flow Doppler were            utilized during procedure). REPORT CONTAINS CRITICAL RESULT Indications:    R07.9* Chest pain, unspecified  History:        Patient has prior history of Echocardiogram examinations, most                 recent 10/24/2022. CHF, Signs/Symptoms:Bacteremia and Altered                 Mental Status; Risk Factors:Diabetes and Dyslipidemia.  Sonographer:    Ellouise Mose RDCS Referring Phys: [id number] VISHAL R PATEL  Sonographer Comments: Technically difficult study due to poor echo windows and patient is obese. Image acquisition challenging due to patient body habitus. IMPRESSIONS  1. Wall motion abnormalities in the LAD distribution. Either represents large, wrap around LAD infarct versus takotsubo cardiomyopathy. Left ventricular ejection fraction, by estimation, is 35 to 40%. The left ventricle has moderately  decreased function. The left ventricle demonstrates regional wall motion abnormalities (see scoring diagram/findings for description). The left ventricular internal cavity size was severely  dilated. Left ventricular diastolic parameters are consistent with Grade I  diastolic dysfunction (impaired relaxation).  2. Right ventricular systolic function is normal. The right ventricular size is normal.  3. Left atrial size was mildly dilated.  4. Right atrial size was mildly dilated.  5. The mitral valve is normal in structure. Trivial mitral valve regurgitation. No evidence of mitral stenosis.  6. The aortic valve is tricuspid. Aortic valve regurgitation is not visualized. No aortic stenosis is present. Comparison(s): The left ventricular function is significantly worse. Conclusion(s)/Recommendation(s): No left ventricular mural or apical thrombus/thrombi. FINDINGS  Left Ventricle: Wall motion abnormalities in the LAD distribution. Either represents large, wrap around LAD infarct versus takotsubo cardiomyopathy. Left ventricular ejection fraction, by estimation, is 35 to 40%. The left ventricle has moderately decreased function. The left ventricle demonstrates regional wall motion abnormalities. Definity  contrast agent was given IV to delineate the left ventricular endocardial borders. The left ventricular internal cavity size was severely dilated. There is no left ventricular hypertrophy. Left ventricular diastolic parameters are consistent with Grade I diastolic dysfunction (impaired relaxation).  LV Wall Scoring: The apex is akinetic. The mid and distal anterior wall, mid and distal lateral wall, mid and distal inferior wall, mid anterolateral segment, mid inferoseptal segment, and apical septal segment are hypokinetic. The anterior septum, basal inferolateral segment, basal anterolateral segment, basal anterior segment, basal inferior segment, and basal inferoseptal segment are normal. Right Ventricle: The right ventricular size is normal. No increase in right ventricular wall thickness. Right ventricular systolic function is normal. Left Atrium: Left atrial size was mildly dilated. Right Atrium: Right  atrial size was mildly dilated. Pericardium: There is no evidence of pericardial effusion. Mitral Valve: The mitral valve is normal in structure. Trivial mitral valve regurgitation. No evidence of mitral valve stenosis. Tricuspid Valve: The tricuspid valve is normal in structure. Tricuspid valve regurgitation is mild . No evidence of tricuspid stenosis. Aortic Valve: The aortic valve is tricuspid. Aortic valve regurgitation is not visualized. No aortic stenosis is present. Pulmonic Valve: The pulmonic valve was normal in structure. Pulmonic valve regurgitation is not visualized. No evidence of pulmonic stenosis. Aorta: The aortic root and ascending aorta are structurally normal, with no evidence of dilitation. IAS/Shunts: No atrial level shunt detected by color flow Doppler.  LEFT VENTRICLE PLAX 2D LVIDd:         5.55 cm      Diastology LVIDs:         4.10 cm      LV e' medial:    13.80 cm/s LV PW:         1.20 cm      LV E/e' medial:  5.7 LV IVS:        1.10 cm      LV e' lateral:   4.57 cm/s LVOT diam:     2.30 cm      LV E/e' lateral: 17.1 LV SV:         83 LV SV Index:   32 LVOT Area:     4.15 cm  LV Volumes (MOD) LV vol d, MOD A2C: 133.0 ml LV vol d, MOD A4C: 235.0 ml LV vol s, MOD A2C: 83.3 ml LV vol s, MOD A4C: 136.0 ml LV SV MOD A2C:     49.7 ml LV SV MOD A4C:     235.0 ml LV SV  MOD BP:      80.5 ml RIGHT VENTRICLE             IVC RV S prime:     16.90 cm/s  IVC diam: 1.90 cm TAPSE (M-mode): 2.5 cm LEFT ATRIUM             Index        RIGHT ATRIUM           Index LA diam:        3.60 cm 1.41 cm/m   RA Area:     19.10 cm LA Vol (A2C):   70.1 ml 27.40 ml/m  RA Volume:   53.90 ml  21.07 ml/m LA Vol (A4C):   44.8 ml 17.51 ml/m LA Biplane Vol: 57.9 ml 22.63 ml/m  AORTIC VALVE LVOT Vmax:   121.00 cm/s LVOT Vmean:  85.100 cm/s LVOT VTI:    0.199 m  AORTA Ao Root diam: 2.85 cm Ao Asc diam:  3.30 cm MITRAL VALVE                TRICUSPID VALVE MV Area (PHT): 4.74 cm     TR Peak grad:   21.5 mmHg MV Decel Time:  160 msec     TR Vmax:        232.00 cm/s MV E velocity: 78.05 cm/s MV A velocity: 110.00 cm/s  SHUNTS MV E/A ratio:  0.71         Systemic VTI:  0.20 m                             Systemic Diam: 2.30 cm Morene Brownie Electronically signed by Morene Brownie Signature Date/Time: 02/15/2024/12:50:16 PM    Final    US  RENAL Result Date: 02/15/2024 EXAM: RETROPERITONEAL ULTRASOUND OF THE KIDNEYS AND URINARY BLADDER TECHNIQUE: Real-time ultrasonography of the retroperitoneum including the kidneys and urinary bladder was performed. COMPARISON: CT abdomen and pelvis dated 01/25/2024. CLINICAL HISTORY: Acute kidney injury (HCC). FINDINGS: RIGHT KIDNEY: The right kidney measures 10.6 x 5.4 x 4.1 cm with a volume of 120 ml. The right kidney demonstrates normal echogenicity. No hydronephrosis or mass. LEFT KIDNEY: The left kidney measures 10.3 x 5.1 x 5.7 cm with a volume of 155 ml. No left hydronephrosis or mass. Increased cortical echogenicity in the left kidney which can be seen with medical renal disease. BLADDER: Unremarkable appearance of the bladder. IMPRESSION: 1. Increased cortical echogenicity in the left kidney, suggestive of medical renal disease. 2. No hydronephrosis or mass in either kidney. Electronically signed by: Norman Gatlin MD 02/15/2024 01:55 AM EDT RP Workstation: [id number]   CT HEAD WO CONTRAST Result Date: 02/14/2024 CLINICAL DATA:  Altered mental status EXAM: CT HEAD WITHOUT CONTRAST TECHNIQUE: Contiguous axial images were obtained from the base of the skull through the vertex without intravenous contrast. RADIATION DOSE REDUCTION: This exam was performed according to the departmental dose-optimization program which includes automated exposure control, adjustment of the mA and/or kV according to patient size and/or use of iterative reconstruction technique. COMPARISON:  10/23/2022 FINDINGS: Brain: No evidence of acute infarction, hemorrhage, hydrocephalus, extra-axial collection or mass  lesion/mass effect. Vascular: No hyperdense vessel or unexpected calcification. Skull: Normal. Negative for fracture or focal lesion. Sinuses/Orbits: No acute finding. Other: None. IMPRESSION: No acute abnormality noted. Electronically Signed   By: Oneil Devonshire M.D.   On: 02/14/2024 21:59   DG Chest Port 1 View Result Date: 02/14/2024 CLINICAL DATA:  Altered mental  status. EXAM: PORTABLE CHEST 1 VIEW COMPARISON:  Chest x-ray 02/10/2024 FINDINGS: Enlarged cardiopericardial silhouette vascular congestion. Question trace edema. Film is rotated to left. Films are also under penetrated. No pneumothorax. No large effusion. Overlapping cardiac leads. IMPRESSION: Limited x-ray. Enlarged cardiac silhouette with vascular congestion. Question trace edema. Electronically Signed   By: Ranell Bring M.D.   On: 02/14/2024 19:41    Cardiac Studies Echo 02/15/24: 1. Wall motion abnormalities in the LAD distribution. Either represents  large, wrap around LAD infarct versus takotsubo cardiomyopathy. Left  ventricular ejection fraction, by estimation, is 35 to 40%. The left  ventricle has moderately decreased  function. The left ventricle demonstrates regional wall motion  abnormalities (see scoring diagram/findings for description). The left  ventricular internal cavity size was severely dilated. Left ventricular  diastolic parameters are consistent with Grade I   diastolic dysfunction (impaired relaxation).   2. Right ventricular systolic function is normal. The right ventricular  size is normal.   3. Left atrial size was mildly dilated.   4. Right atrial size was mildly dilated.   5. The mitral valve is normal in structure. Trivial mitral valve  regurgitation. No evidence of mitral stenosis.   6. The aortic valve is tricuspid. Aortic valve regurgitation is not  visualized. No aortic stenosis is present.    Patient Profile   Ms. Margolis is a 60F with HFimpEF, morbid obesity, DVT, Sjogren's syndrome, SLE and  CKD 3a admitted with encephalopathy. Cardiology consulted for elevated troponin.   Assessment & Plan   # Elevated troponin:  HS-troponin peaked at 1370.  She denies chest pain.  She was admitted with encephalopathy in the setting of polypharmacy, but mental status has now cleared.  She is not very physically active at baseline and walks with a walker.  She is also been hospitalized recently with gastroparesis and notes that she has been off all of her cardiac medications since that time.  There are no acute ischemic changes on EKG.  Echocardiogram reveals a change in systolic function.  In the past her LVEF was 30-35%.  Cardiac cath at that time revealed normal coronaries.  Systolic function subsequently improved to 55-60% in 2024. Now back down to 35-40%.  Given her acute illness, intermittent ability to keep down medications, and resolving acute on chronic renal failure, I do not think this is a good time for cardiac catheterization.  Recommend medical management for heart failure and potential CAD at this time.  Consider cardiac catheterization as an outpatient if systolic function remains reduced or if she develops ischemic symptoms.   # Acute HFrEF: LVEF 35-40% again.  She does have lower extremity edema.    BP now running low.  Renal function has normalized.  Plan to transition to metoprolol  and low dose Entresto  tomorrow.  Start gentle diuresis tomorrow if BP permits.  Prior to her more recent illnesses she was on carvedilol , Jardiance , Entresto , and spironolactone , as well as torsemide .  These have been held in the setting of gastroparesis and now tenuous blood pressures.  Will titrate GDMT back onto her regimen as able.   # AKI:  Resolved.    For questions or updates, please contact [facility] Please consult [email or web address] for contact info under     Signed, Annabella Scarce, MD  02/16/2024, 11:17 AM

## 2024-02-16 NOTE — Progress Notes (Signed)

## 2024-02-16 NOTE — Progress Notes (Signed)
 PHARMACY - ANTICOAGULATION CONSULT NOTE  Pharmacy Consult for Heparin  Indication: hx DVT  Allergies  Allergen Reactions   Metoclopramide  Other (See Comments)    Developed restless leg, akathisia type limb movements.    Codeine Itching and Rash   Hydrocodone  Rash   Percocet [Oxycodone -Acetaminophen ] Hives and Rash    Tolerates dilaudid , Tolerates acetaminophen      Patient Measurements: Height: 5' 9 (175.3 cm) Weight: (!) 168 kg (370 lb 6 oz) IBW/kg (Calculated) : 66.2 HEPARIN  DW (KG): 108.3  Vital Signs: Temp: 98.1 F (36.7 C) (07/18 0000) Temp Source: Oral (07/18 0000) BP: 110/47 (07/18 0300) Pulse Rate: 92 (07/18 0300)  Labs: Recent Labs    02/14/24 2011 02/14/24 2104 02/15/24 0038 02/15/24 0234 02/15/24 0505 02/15/24 1214 02/16/24 0245  HGB 9.7* 9.9*  --   --  8.8*  --  8.0*  HCT 30.9* 29.0*  --   --  27.9*  --  25.6*  PLT 166  --   --   --  147*  --  143*  APTT  --   --   --  24  --   --   --   LABPROT  --   --   --  17.0*  --   --   --   INR  --   --   --  1.3*  --   --   --   HEPARINUNFRC  --   --   --  <0.10*  --  0.29* 0.31  CREATININE 2.03* 2.20*  --   --  1.55*  --   --   TROPONINIHS 857*  --  1,370*  --  1,210*  --   --     Estimated Creatinine Clearance: 70.8 mL/min (A) (by C-G formula based on SCr of 1.55 mg/dL (H)).   Medical History: Past Medical History:  Diagnosis Date   Acute renal failure (HCC)     Intractable nausea vomiting secondary to diabetic gastroparesis causing dehydration and acute renal failure /notes 04/01/2013   Anginal pain (HCC)    Anxiety    Bell's palsy 08/09/2010   Bicornuate uterus    CAP (community acquired pneumonia) 10/2011   thelbert 11/08/2011   Chest pain 01/13/2014   CHF (congestive heart failure) (HCC)    Diabetic gastroparesis associated with type 2 diabetes mellitus (HCC)    this is presumed diagnoses, not confirmed by any studies.    Eczema    Family history of breast cancer    Family history of malignant  neoplasm of breast    Family history of malignant neoplasm of ovary    Family history of ovarian cancer    Family history of prostate cancer    GERD (gastroesophageal reflux disease)    High cholesterol    Hypertension    Liver lesion 08/13/2019   Liver mass 2014   biopsied 03/2013 at [facility], not malignant.  is to undergo radiologic ablation of the mass in sept/October 2014.    Lupus    Migraines    maybe a couple times/yr (04/01/2013)   Obesity    Obstructive sleep apnea on CPAP 2011   Oxygen at nights   Pulmonary embolism (HCC) 05/21/2021   SJOGREN'S SYNDROME 08/09/2010   SLE (systemic lupus erythematosus) (HCC)    thelbert 12/01/2010    Medications:  Infusions:   ceFEPime  (MAXIPIME ) IV Stopped (02/16/24 0314)   heparin  1,600 Units/hr (02/16/24 0200)   metronidazole  Stopped (02/15/24 2203)   vancomycin  Stopped (02/16/24 0005)  Assessment: 53 yo F with hx VTE on chronic Eliquis  for VTE prevention.   Last dose Eliquis  unknown. AKI - Scr 2.2, CBC- Hg low at 9.9, pltc WNL.  Troponin increasing. Baseline heparin  level = <0.1.  02/16/2024: Heparin  level 0.31-therapeutic on IV heparin  1600 units/hr CBC: Hgb 8.0-low/trending down, Plts 143 Per RN, no overt bleeding or infusion issues noted  Goal of Therapy:  Heparin  level 0.3-0.7 Monitor platelets by anticoagulation protocol: Yes   Plan:   Continue IV heparin  from 1600 units/hr Recheck heparin  level at noon to ensure remains therapeutic Daily heparin  level & CBC while on heparin  F/U plans to resume apixaban   [name], PharmD, BCPS 02/16/2024 3:30 AM

## 2024-02-16 NOTE — Progress Notes (Signed)
 PHARMACY - PHYSICIAN COMMUNICATION CRITICAL VALUE ALERT - BLOOD CULTURE IDENTIFICATION (BCID)  [name] is an 53 y.o. female who presented to [facility] on 02/14/2024 with a chief complaint of AMS.  Assessment:  Pt currently on antibiotics for sepsis.  BCID + 1/3 GPR. No BCID panel ran for GPR  Name of physician (or Provider) Contacted: Dr. Drusilla  Current antibiotics: Cefepime  + metronidazole  + vancomycin   Changes to prescribed antibiotics recommended: No changes  Ronal CHRISTELLA Rav, PharmD 02/16/2024  11:51 AM

## 2024-02-16 NOTE — Progress Notes (Signed)
 Pharmacy Antibiotic Note  [name] is a 53 y.o. female admitted on 02/14/2024 with sepsis.  Pharmacy has been consulted for vancomycin , cefepime  dosing.  Today, 02/16/24 -WBC trending down -Renal function continues to improve  Plan: Continue cefepime  2 g IV q8h Increase vancomycin  to 1250 mg IV q12h for estimated AUC of 522 using SCr 1.08, Vd 0.5 Goal AUC 400-550. Check levels as needed Monitor renal function, culture data  Height: 5' 9 (175.3 cm) Weight: (!) 168.6 kg (371 lb 11.1 oz) IBW/kg (Calculated) : 66.2  Temp (24hrs), Avg:98.4 F (36.9 C), Min:98 F (36.7 C), Max:99.3 F (37.4 C)  Recent Labs  Lab 02/09/24 2334 02/09/24 2345 02/14/24 2011 02/14/24 2103 02/14/24 2104 02/15/24 0505 02/15/24 0953 02/16/24 0245  WBC 12.7*  --  22.5*  --   --  19.5*  --  14.1*  CREATININE  --  1.58* 2.03*  --  2.20* 1.55*  --  1.08*  LATICACIDVEN  --   --   --  2.0*  --   --  1.9  --     Estimated Creatinine Clearance: 101.9 mL/min (A) (by C-G formula based on SCr of 1.08 mg/dL (H)).    Allergies  Allergen Reactions   Metoclopramide  Other (See Comments)    Developed restless leg, akathisia type limb movements.    Codeine Itching and Rash   Hydrocodone  Rash   Percocet [Oxycodone -Acetaminophen ] Hives and Rash    Tolerates dilaudid , Tolerates acetaminophen      Antimicrobials this admission: cefepime  7/16 >>  vancomycin  7/16 >>   Microbiology results: 7/17 BCx: ngtd 7/16 UCx: 20k E.coli (pan-s)   Ronal CHRISTELLA Rav, PharmD 02/16/2024 9:33 AM

## 2024-02-17 DIAGNOSIS — I429 Cardiomyopathy, unspecified: Secondary | ICD-10-CM | POA: Diagnosis not present

## 2024-02-17 DIAGNOSIS — G9340 Encephalopathy, unspecified: Secondary | ICD-10-CM | POA: Diagnosis not present

## 2024-02-17 DIAGNOSIS — R7989 Other specified abnormal findings of blood chemistry: Secondary | ICD-10-CM | POA: Diagnosis not present

## 2024-02-17 DIAGNOSIS — R531 Weakness: Secondary | ICD-10-CM | POA: Diagnosis not present

## 2024-02-17 DIAGNOSIS — I5032 Chronic diastolic (congestive) heart failure: Secondary | ICD-10-CM | POA: Diagnosis not present

## 2024-02-17 LAB — BASIC METABOLIC PANEL WITH GFR
Anion gap: 9 (ref 5–15)
BUN: 10 mg/dL (ref 6–20)
CO2: 19 mmol/L — ABNORMAL LOW (ref 22–32)
Calcium: 8.4 mg/dL — ABNORMAL LOW (ref 8.9–10.3)
Chloride: 113 mmol/L — ABNORMAL HIGH (ref 98–111)
Creatinine, Ser: 0.99 mg/dL (ref 0.44–1.00)
GFR, Estimated: >60 mL/min (ref >60)
Glucose, Bld: 144 mg/dL — ABNORMAL HIGH (ref 70–99)
Potassium: 3.7 mmol/L (ref 3.5–5.1)
Sodium: 141 mmol/L (ref 135–145)

## 2024-02-17 LAB — CBC
HCT: 28.1 % — ABNORMAL LOW (ref 36.0–46.0)
Hemoglobin: 8.7 g/dL — ABNORMAL LOW (ref 12.0–15.0)
MCH: 28.6 pg (ref 26.0–34.0)
MCHC: 31 g/dL (ref 30.0–36.0)
MCV: 92.4 fL (ref 80.0–100.0)
Platelets: 158 K/uL (ref 150–400)
RBC: 3.04 MIL/uL — ABNORMAL LOW (ref 3.87–5.11)
RDW: 14.2 % (ref 11.5–15.5)
WBC: 13.7 K/uL — ABNORMAL HIGH (ref 4.0–10.5)
nRBC: 0.2 % (ref 0.0–0.2)

## 2024-02-17 LAB — GLUCOSE, CAPILLARY
Glucose-Capillary: 151 mg/dL — ABNORMAL HIGH (ref 70–99)
Glucose-Capillary: 148 mg/dL — ABNORMAL HIGH (ref 70–99)
Glucose-Capillary: 178 mg/dL — ABNORMAL HIGH (ref 70–99)
Glucose-Capillary: 221 mg/dL — ABNORMAL HIGH (ref 70–99)

## 2024-02-17 LAB — CREATININE, SERUM
Creatinine, Ser: 0.82 mg/dL (ref 0.44–1.00)
GFR, Estimated: >60 mL/min (ref >60)

## 2024-02-17 MED ORDER — VANCOMYCIN HCL 1500 MG/300ML IV SOLN
1500.0000 mg | Freq: Two times a day (BID) | INTRAVENOUS | Status: DC
  Administered 2024-02-17: 1500 mg via INTRAVENOUS
  Filled 2024-02-17 (×2): qty 300

## 2024-02-17 MED ORDER — SPIRONOLACTONE 12.5 MG HALF TABLET
12.5000 mg | ORAL_TABLET | Freq: Every day | ORAL | Status: DC
  Administered 2024-02-17 – 2024-02-20 (×4): 12.5 mg via ORAL
  Filled 2024-02-17 (×4): qty 1

## 2024-02-17 MED ORDER — TORSEMIDE 20 MG PO TABS
20.0000 mg | ORAL_TABLET | Freq: Every day | ORAL | Status: DC
  Administered 2024-02-17 – 2024-02-27 (×11): 20 mg via ORAL
  Filled 2024-02-17 (×11): qty 1

## 2024-02-17 NOTE — Plan of Care (Signed)
 Pt deconditioned

## 2024-02-17 NOTE — Progress Notes (Addendum)
 Pharmacy Antibiotic Note  [name] is a 53 y.o. female admitted on 02/14/2024 with sepsis.  Pharmacy has been consulted for vancomycin , cefepime  dosing.  Today, 02/17/24 -WBC trending down -Renal function continues to improve and estimated AUC for vancomycin  re-calculated  Plan: Continue cefepime  2 g IV q8h Increase vancomycin  to 1500 mg IV q12h for estimated AUC of 480.9 using SCr 0.82, Vd 0.5 Goal AUC 400-550. Check levels as needed Monitor renal function, culture data  Height: 5' 9 (175.3 cm) Weight: (!) 170.4 kg (375 lb 10.6 oz) IBW/kg (Calculated) : 66.2  Temp (24hrs), Avg:98.9 F (37.2 C), Min:98.2 F (36.8 C), Max:99.8 F (37.7 C)  Recent Labs  Lab 02/14/24 2011 02/14/24 2103 02/14/24 2104 02/15/24 0505 02/15/24 0953 02/16/24 0245 02/17/24 0652  WBC 22.5*  --   --  19.5*  --  14.1* 13.7*  CREATININE 2.03*  --  2.20* 1.55*  --  1.08* 0.99  0.82  LATICACIDVEN  --  2.0*  --   --  1.9  --   --     Estimated Creatinine Clearance: 135.1 mL/min (by C-G formula based on SCr of 0.82 mg/dL).    Allergies  Allergen Reactions   Metoclopramide  Other (See Comments)    Developed restless leg, akathisia type limb movements.    Codeine Itching and Rash   Hydrocodone  Rash   Percocet [Oxycodone -Acetaminophen ] Hives and Rash    Tolerates dilaudid , Tolerates acetaminophen      Antimicrobials this admission: cefepime  7/16 >>  vancomycin  7/16 >>   Microbiology results: 7/16 BCx: 1/3 gpr; no BCID panel performed-considered a contaminant 7/16 UCx: 20k E.coli (pan-s)   Eleanor Agent, PharmD, BCPS 02/17/2024 10:58 AM

## 2024-02-17 NOTE — Progress Notes (Addendum)
  Progress Note  Patient Name: [name] Date of Encounter: 02/17/2024 [facility] Cardiologist: Gordy Bergamo, MD   Interval Summary   Continues to improve clinically.  Remains in normal sinus rhythm, no further episodes of SVT. Input/output not measured accurately.  Weight reportedly has increased 4 pounds since yesterday.  Vital Signs Vitals:   02/17/24 0400 02/17/24 0500 02/17/24 0610 02/17/24 0613  BP: (!) 134/54 (!) 141/61  (!) 140/74  Pulse: 92 98  97  Resp: 16 20  20   Temp:    99 F (37.2 C)  TempSrc:    Oral  SpO2: 99% 98%  100%  Weight:   (!) 170.4 kg   Height:        Intake/Output Summary (Last 24 hours) at 02/17/2024 0840 Last data filed at 02/16/2024 2000 Gross per 24 hour  Intake 480.41 ml  Output 600 ml  Net -119.59 ml      02/17/2024    6:10 AM 02/16/2024    5:00 AM 02/15/2024    8:10 AM  Last 3 Weights  Weight (lbs) 375 lb 10.6 oz 371 lb 11.1 oz 370 lb 6 oz  Weight (kg) 170.4 kg 168.6 kg 168 kg      Telemetry/ECG  Normal sinus rhythm- Personally Reviewed  Physical Exam  GEN: No acute distress.  Super-Morbid obesity Neck: No JVD Cardiac: RRR, no murmurs, rubs, or gallops.  Respiratory: Clear to auscultation bilaterally. GI: Soft, nontender, non-distended  MS: No edema  Assessment & Plan   Doing well roughly 24 hours after reinstituting therapy with Entresto  and metoprolol  succinate.  Blood pressure tolerating well so far.  Renal function continues to improve rapidly is now back in fully normal range.  Probably not a great candidate for SGLT2 inhibitors due to high risk of fungal/bacterial infections but reportedly she was taking Jardiance  as an outpatient in the past without problems.  Reintroduce torsemide  as well as spironolactone  low-dose today, which also help with her hypokalemia.  Patient with a history of nonischemic cardiomyopathy with recovered LV systolic function now again with diminished LV EF and regional wall motion  abnormalities in a pattern suggestive of Takotsubo cardiomyopathy (although LAD artery distribution ischemia/injury cannot be fully excluded).  No plan for coronary angiography at this time.  Would recommend repeating a limited outpatient echocardiogram for LV systolic function in a couple of weeks.  If wall motion abnormalities to resolve and EF returns to normal, I think a diagnosis of Takotsubo cardiomyopathy is quite likely.  If LV function does not recover, we can consider outpatient coronary angiography.    For questions or updates, please contact [facility] Please consult [email or web address] for contact info under       Signed, Jerel Balding, MD

## 2024-02-17 NOTE — Progress Notes (Signed)
 [facility]  PROGRESS NOTE  [name] FMW:[id number] DOB: Aug 04, 1970 DOA: 02/14/2024 PCP: [name], MD   Brief HPI:   a 53 y.o. female with medical history significant for chronic HFpEF, history of VTE on Eliquis , insulin -dependent T2DM, SLE, Sjogren's syndrome, HTN, gastroparesis, hypothyroidism, anemia, chronic back pain, morbid obesity who presented to the ED for evaluation of altered mental status  Patient recently admitted at [facility] from 6/30-7/8 for intractable nausea and vomiting related to gastroparesis. She had associated AKI. She was managed with antiemetics with improvement in nausea vomiting. She was also complaining of acute on chronic back pain. Her Lyrica  was increased from 75 mg to 100 mg. She was started on baclofen, Lidoderm  patch, and given short supply of oral Dilaudid  overnight   Assessment/Plan:   Acute encephalopathy: -Resolved; back to baseline Hypersomnolent and unarousable on arrival.  Likely in setting of polypharmacy-   Recently prescribed short course of Dilaudid , baclofen, and increased Lyrica  dose after hospitalized at [facility].  UDS positive for opiates.  CT head negative for acute changes. - Holding Dilaudid , baclofen, Lyrica , Seroquel  - Follow blood and urine cultures    SIRS: Patient presented with leukocytosis, tachycardia, AKI.   No clear primary infectious source other than buttocks wound with some purulent discharge.  UA negative for UTI.  CXR limited but without obvious pneumonia.  Blood pressure stable without shock appearance. - She was started on broad-spectrum empiric antibiotics with vancomycin , cefepime , Flagyl  - Blood cultures 1 set growing gram-negative rods -Urine culture growing E. coli, -Continue empiric antibiotics for now till blood final culture is resulted - Consulted  wound care for buttocks wound   Elevated troponin: Initial troponin 857.  EKG without any significant ischemic changes and  is otherwise similar to previous other than increased heart rate.  Patient is denying chest pain. -Repeat troponin elevated  1370 - Cardiology consulted -No plan for cardiac catheter at this time.   Acute kidney injury: Creatinine 2.03 on admission.  Recent baseline seems closer to 1.5. - Obtained renal ultrasound which showed no hydronephrosis - Monitor UOP, bladder scan as needed - Holding spironolactone , metformin , Jardiance , Entresto    Chronic HFpEF: Last EF 55-60% by TTE 01/04/2023.  Morbidly obese body habitus limits volume assessment however overall does not appear significantly volume overloaded.  Mucous membranes are very dry. - Holding spironolactone , Jardiance , Entresto  -Repeat echocardiogram done on 02/15/2024 showed EF of 35 to 40% -GDMT to be started as per cardiology -Started on Entresto  and metoprolol  succinate   History of DVT: Started on heparin  - Heparin  was discontinued and patient started back on Eliquis    Insulin -dependent type 2 diabetes: Holding metformin  and Jardiance .  Placed on SSI.   Chronic normocytic anemia: Hemoglobin relatively stable.  Continue monitor.   Hypothyroidism: Continue Synthroid .   Chronic back pain: Lyrica  on hold as above.   SLE/Sjogren's syndrome: Holding Plaquenil .   Mood disorder: Meds on hold.    Hypokalemia - Replete  Medications     apixaban   2.5 mg Oral BID   Chlorhexidine  Gluconate Cloth  6 each Topical Daily   collagenase    Topical Daily   feeding supplement  237 mL Oral BID BM   insulin  aspart  0-5 Units Subcutaneous QHS   insulin  aspart  0-9 Units Subcutaneous TID WC   levothyroxine   50 mcg Oral Q0600   metoprolol  succinate  25 mg Oral Daily   naLOXone  (NARCAN )  injection  0.4 mg Intravenous Once   sacubitril -valsartan   1 tablet  Oral BID   sodium chloride  flush  10-40 mL Intracatheter Q12H   sodium chloride  flush  3 mL Intravenous Q12H   spironolactone   12.5 mg Oral Daily   torsemide   20 mg Oral Daily      Data Reviewed:   CBG:  Recent Labs  Lab 02/16/24 1135 02/16/24 1614 02/16/24 2232 02/17/24 0849 02/17/24 1144  GLUCAP 172* 253* 154* 151* 148*    SpO2: 100 %    Vitals:   02/17/24 0610 02/17/24 0613 02/17/24 0956 02/17/24 1041  BP:  (!) 140/74 132/72 132/72  Pulse:  97 97 97  Resp:  20 20   Temp:  99 F (37.2 C) 98.6 F (37 C)   TempSrc:  Oral Oral   SpO2:  100% 100%   Weight: (!) 170.4 kg     Height:          Data Reviewed:  Basic Metabolic Panel: Recent Labs  Lab 02/14/24 2011 02/14/24 2104 02/15/24 0505 02/16/24 0245 02/17/24 0652  NA 140 140 139 136 141  K 4.1 3.9 3.7 3.2* 3.7  CL 108 108 110 108 113*  CO2 21*  --  22 21* 19*  GLUCOSE 216* 211* 190* 226* 144*  BUN 39* 34* 33* 21* 10  CREATININE 2.03* 2.20* 1.55* 1.08* 0.99  0.82  CALCIUM  9.4  --  8.7* 7.9* 8.4*    CBC: Recent Labs  Lab 02/14/24 2011 02/14/24 2104 02/15/24 0505 02/16/24 0245 02/17/24 0652  WBC 22.5*  --  19.5* 14.1* 13.7*  NEUTROABS 18.5*  --   --   --   --   HGB 9.7* 9.9* 8.8* 8.0* 8.7*  HCT 30.9* 29.0* 27.9* 25.6* 28.1*  MCV 90.4  --  89.4 90.5 92.4  PLT 166  --  147* 143* 158    LFT Recent Labs  Lab 02/14/24 2011 02/15/24 0505 02/16/24 0245  AST 27 22 18   ALT 20 15 13   ALKPHOS 74 58 53  BILITOT 1.4* 1.5* 1.3*  PROT 6.9 5.5* 5.2*  ALBUMIN  3.5 2.8* 2.4*     Antibiotics: Anti-infectives (From admission, onward)    Start     Dose/Rate Route Frequency Ordered Stop   02/17/24 2000  vancomycin  (VANCOREADY) IVPB 1500 mg/300 mL        1,500 mg 150 mL/hr over 120 Minutes Intravenous Every 12 hours 02/17/24 1101     02/16/24 2000  vancomycin  (VANCOREADY) IVPB 1250 mg/250 mL  Status:  Discontinued        1,250 mg 166.7 mL/hr over 90 Minutes Intravenous Every 12 hours 02/16/24 0932 02/17/24 1101   02/15/24 2200  vancomycin  (VANCOCIN ) IVPB 1000 mg/200 mL premix  Status:  Discontinued        1,000 mg 200 mL/hr over 60 Minutes Intravenous Every 24 hours  02/14/24 2345 02/15/24 0958   02/15/24 2200  vancomycin  (VANCOREADY) IVPB 1750 mg/350 mL  Status:  Discontinued        1,750 mg 175 mL/hr over 120 Minutes Intravenous Every 24 hours 02/15/24 0958 02/16/24 0932   02/15/24 1000  ceFEPIme  (MAXIPIME ) 2 g in sodium chloride  0.9 % 100 mL IVPB        2 g 200 mL/hr over 30 Minutes Intravenous Every 8 hours 02/15/24 0958     02/15/24 0900  ceFEPIme  (MAXIPIME ) 2 g in sodium chloride  0.9 % 100 mL IVPB  Status:  Discontinued        2 g 200 mL/hr over 30 Minutes Intravenous Every 12 hours 02/14/24 2345  02/15/24 0958   02/14/24 2345  metroNIDAZOLE  (FLAGYL ) IVPB 500 mg        500 mg 100 mL/hr over 60 Minutes Intravenous 2 times daily 02/14/24 2334 02/21/24 2159   02/14/24 2115  vancomycin  (VANCOREADY) IVPB 2000 mg/400 mL        2,000 mg 200 mL/hr over 120 Minutes Intravenous  Once 02/14/24 2110 02/15/24 0238   02/14/24 2115  ceFEPIme  (MAXIPIME ) 2 g in sodium chloride  0.9 % 100 mL IVPB        2 g 200 mL/hr over 30 Minutes Intravenous  Once 02/14/24 2110 02/14/24 2235        DVT prophylaxis: Heparin   Code Status: Full code  Family Communication: No family at bedside   CONSULTS    Subjective   Denies chest pain.   Objective    Physical Examination:  Appears in no acute distress S1-S2, regular Lungs clear to auscultation bilaterally Abdomen is soft, nontender  Status is: Inpatient:             [name]   [facility] If 7PM-7AM, please contact night-coverage at [email or web address], Office  [phone]   02/17/2024, 12:37 PM  LOS: 3 days

## 2024-02-18 DIAGNOSIS — R7989 Other specified abnormal findings of blood chemistry: Secondary | ICD-10-CM | POA: Diagnosis not present

## 2024-02-18 DIAGNOSIS — I5032 Chronic diastolic (congestive) heart failure: Secondary | ICD-10-CM | POA: Diagnosis not present

## 2024-02-18 DIAGNOSIS — I429 Cardiomyopathy, unspecified: Secondary | ICD-10-CM | POA: Diagnosis not present

## 2024-02-18 DIAGNOSIS — G9340 Encephalopathy, unspecified: Secondary | ICD-10-CM | POA: Diagnosis not present

## 2024-02-18 DIAGNOSIS — R531 Weakness: Secondary | ICD-10-CM | POA: Diagnosis not present

## 2024-02-18 LAB — CBC
HCT: 28.6 % — ABNORMAL LOW (ref 36.0–46.0)
Hemoglobin: 9 g/dL — ABNORMAL LOW (ref 12.0–15.0)
MCH: 28.5 pg (ref 26.0–34.0)
MCHC: 31.5 g/dL (ref 30.0–36.0)
MCV: 90.5 fL (ref 80.0–100.0)
Platelets: 189 K/uL (ref 150–400)
RBC: 3.16 MIL/uL — ABNORMAL LOW (ref 3.87–5.11)
RDW: 14.1 % (ref 11.5–15.5)
WBC: 12.2 K/uL — ABNORMAL HIGH (ref 4.0–10.5)
nRBC: 0.2 % (ref 0.0–0.2)

## 2024-02-18 LAB — GLUCOSE, CAPILLARY
Glucose-Capillary: 213 mg/dL — ABNORMAL HIGH (ref 70–99)
Glucose-Capillary: 233 mg/dL — ABNORMAL HIGH (ref 70–99)
Glucose-Capillary: 187 mg/dL — ABNORMAL HIGH (ref 70–99)
Glucose-Capillary: 175 mg/dL — ABNORMAL HIGH (ref 70–99)

## 2024-02-18 LAB — BASIC METABOLIC PANEL WITH GFR
Anion gap: 10 (ref 5–15)
BUN: 9 mg/dL (ref 6–20)
CO2: 23 mmol/L (ref 22–32)
Calcium: 8.3 mg/dL — ABNORMAL LOW (ref 8.9–10.3)
Chloride: 105 mmol/L (ref 98–111)
Creatinine, Ser: 1.01 mg/dL — ABNORMAL HIGH (ref 0.44–1.00)
GFR, Estimated: >60 mL/min (ref >60)
Glucose, Bld: 269 mg/dL — ABNORMAL HIGH (ref 70–99)
Potassium: 3.4 mmol/L — ABNORMAL LOW (ref 3.5–5.1)
Sodium: 138 mmol/L (ref 135–145)

## 2024-02-18 LAB — CULTURE, BLOOD (ROUTINE X 2): Special Requests: ADEQUATE

## 2024-02-18 MED ORDER — POTASSIUM CHLORIDE CRYS ER 20 MEQ PO TBCR
40.0000 meq | EXTENDED_RELEASE_TABLET | Freq: Once | ORAL | Status: AC
Start: 2024-02-18 — End: 2024-02-18
  Administered 2024-02-18: 40 meq via ORAL
  Filled 2024-02-18: qty 2

## 2024-02-18 MED ORDER — SACUBITRIL-VALSARTAN 49-51 MG PO TABS
1.0000 | ORAL_TABLET | Freq: Two times a day (BID) | ORAL | Status: DC
  Administered 2024-02-18 – 2024-02-27 (×20): 1 via ORAL
  Filled 2024-02-18 (×21): qty 1

## 2024-02-18 MED ORDER — VANCOMYCIN HCL 1250 MG/250ML IV SOLN
1250.0000 mg | Freq: Two times a day (BID) | INTRAVENOUS | Status: DC
  Administered 2024-02-18: 1250 mg via INTRAVENOUS
  Filled 2024-02-18: qty 250

## 2024-02-18 NOTE — Progress Notes (Signed)
 [facility]  PROGRESS NOTE  [name] FMW:[id number] DOB: 07-23-1971 DOA: 02/14/2024 PCP: [name], MD   Brief HPI:   a 53 y.o. female with medical history significant for chronic HFpEF, history of VTE on Eliquis , insulin -dependent T2DM, SLE, Sjogren's syndrome, HTN, gastroparesis, hypothyroidism, anemia, chronic back pain, morbid obesity who presented to the ED for evaluation of altered mental status  Patient recently admitted at [facility] from 6/30-7/8 for intractable nausea and vomiting related to gastroparesis. She had associated AKI. She was managed with antiemetics with improvement in nausea vomiting. She was also complaining of acute on chronic back pain. Her Lyrica  was increased from 75 mg to 100 mg. She was started on baclofen, Lidoderm  patch, and given short supply of oral Dilaudid  overnight   Assessment/Plan:   Acute encephalopathy: -Resolved; back to baseline Hypersomnolent and unarousable on arrival.  Likely in setting of polypharmacy-   Recently prescribed short course of Dilaudid , baclofen, and increased Lyrica  dose after hospitalized at [facility].  UDS positive for opiates.  CT head negative for acute changes. - Holding Dilaudid , baclofen, Lyrica , Seroquel  - Follow blood and urine cultures    SIRS: Patient presented with leukocytosis, tachycardia, AKI.   No clear primary infectious source other than buttocks wound with some purulent discharge.  UA negative for UTI.  CXR limited but without obvious pneumonia.  Blood pressure stable without shock appearance. - She was started on broad-spectrum empiric antibiotics with vancomycin , cefepime , Flagyl  - Blood cultures 1 set grew gram-positive rods, Corynebacterium.  Likely contamination -Urine culture growing E. coli, sensitive to cefepime  -Will discontinue vancomycin  - Consulted  wound care for buttocks wound   Elevated troponin: Initial troponin 857.  EKG without any significant ischemic  changes and is otherwise similar to previous other than increased heart rate.  Patient is denying chest pain. -Repeat troponin elevated  1370 - Cardiology consulted -No plan for cardiac catheter at this time.   Acute kidney injury: Creatinine 2.03 on admission.  Recent baseline seems closer to 1.5. -Creatinine improved to 1.01 with diuresis - Obtained renal ultrasound which showed no hydronephrosis - Monitor UOP, bladder scan as needed    Chronic HFpEF: Last EF 55-60% by TTE 01/04/2023.  Morbidly obese body habitus limits volume assessment however overall does not appear significantly volume overloaded.  Mucous membranes are very dry. -Cardiology has restarted torsemide , Entresto  -Repeat echocardiogram done on 02/15/2024 showed EF of 35 to 40% -GDMT to be started as per cardiology -Started on Entresto  and metoprolol  succinate   History of DVT: Started on heparin  - Heparin  was discontinued and patient started back on Eliquis    Insulin -dependent type 2 diabetes:   Placed on SSI.   Chronic normocytic anemia: Hemoglobin relatively stable.  Continue monitor.   Hypothyroidism: Continue Synthroid .   Chronic back pain: Lyrica  on hold as above.   SLE/Sjogren's syndrome: Holding Plaquenil .   Mood disorder: Meds on hold.    Hypokalemia - Potassium is 3.4, will replace potassium and follow BMP in am.  Medications     apixaban   2.5 mg Oral BID   Chlorhexidine  Gluconate Cloth  6 each Topical Daily   collagenase    Topical Daily   feeding supplement  237 mL Oral BID BM   insulin  aspart  0-5 Units Subcutaneous QHS   insulin  aspart  0-9 Units Subcutaneous TID WC   levothyroxine   50 mcg Oral Q0600   metoprolol  succinate  25 mg Oral Daily   naLOXone  (NARCAN )  injection  0.4 mg Intravenous  Once   sacubitril -valsartan   1 tablet Oral BID   sodium chloride  flush  10-40 mL Intracatheter Q12H   sodium chloride  flush  3 mL Intravenous Q12H   spironolactone   12.5 mg Oral Daily    torsemide   20 mg Oral Daily     Data Reviewed:   CBG:  Recent Labs  Lab 02/17/24 0849 02/17/24 1144 02/17/24 1611 02/17/24 2111 02/18/24 0736  GLUCAP 151* 148* 178* 221* 213*    SpO2: 100 %    Vitals:   02/17/24 1742 02/17/24 2044 02/18/24 0409 02/18/24 0500  BP: 117/63 133/66 119/63   Pulse: 94 83 89   Resp: 18 14 20    Temp: 98.7 F (37.1 C) 98.9 F (37.2 C) 98.7 F (37.1 C)   TempSrc: Oral Oral Oral   SpO2: 97% 99% 100%   Weight:    (!) 163.7 kg  Height:          Data Reviewed:  Basic Metabolic Panel: Recent Labs  Lab 02/14/24 2011 02/14/24 2104 02/15/24 0505 02/16/24 0245 02/17/24 0652 02/18/24 0314  NA 140 140 139 136 141 138  K 4.1 3.9 3.7 3.2* 3.7 3.4*  CL 108 108 110 108 113* 105  CO2 21*  --  22 21* 19* 23  GLUCOSE 216* 211* 190* 226* 144* 269*  BUN 39* 34* 33* 21* 10 9  CREATININE 2.03* 2.20* 1.55* 1.08* 0.99  0.82 1.01*  CALCIUM  9.4  --  8.7* 7.9* 8.4* 8.3*    CBC: Recent Labs  Lab 02/14/24 2011 02/14/24 2104 02/15/24 0505 02/16/24 0245 02/17/24 0652 02/18/24 0314  WBC 22.5*  --  19.5* 14.1* 13.7* 12.2*  NEUTROABS 18.5*  --   --   --   --   --   HGB 9.7* 9.9* 8.8* 8.0* 8.7* 9.0*  HCT 30.9* 29.0* 27.9* 25.6* 28.1* 28.6*  MCV 90.4  --  89.4 90.5 92.4 90.5  PLT 166  --  147* 143* 158 189    LFT Recent Labs  Lab 02/14/24 2011 02/15/24 0505 02/16/24 0245  AST 27 22 18   ALT 20 15 13   ALKPHOS 74 58 53  BILITOT 1.4* 1.5* 1.3*  PROT 6.9 5.5* 5.2*  ALBUMIN  3.5 2.8* 2.4*     Antibiotics: Anti-infectives (From admission, onward)    Start     Dose/Rate Route Frequency Ordered Stop   02/17/24 2000  vancomycin  (VANCOREADY) IVPB 1500 mg/300 mL        1,500 mg 150 mL/hr over 120 Minutes Intravenous Every 12 hours 02/17/24 1101     02/16/24 2000  vancomycin  (VANCOREADY) IVPB 1250 mg/250 mL  Status:  Discontinued        1,250 mg 166.7 mL/hr over 90 Minutes Intravenous Every 12 hours 02/16/24 0932 02/17/24 1101   02/15/24 2200   vancomycin  (VANCOCIN ) IVPB 1000 mg/200 mL premix  Status:  Discontinued        1,000 mg 200 mL/hr over 60 Minutes Intravenous Every 24 hours 02/14/24 2345 02/15/24 0958   02/15/24 2200  vancomycin  (VANCOREADY) IVPB 1750 mg/350 mL  Status:  Discontinued        1,750 mg 175 mL/hr over 120 Minutes Intravenous Every 24 hours 02/15/24 0958 02/16/24 0932   02/15/24 1000  ceFEPIme  (MAXIPIME ) 2 g in sodium chloride  0.9 % 100 mL IVPB        2 g 200 mL/hr over 30 Minutes Intravenous Every 8 hours 02/15/24 0958     02/15/24 0900  ceFEPIme  (MAXIPIME ) 2 g in sodium chloride   0.9 % 100 mL IVPB  Status:  Discontinued        2 g 200 mL/hr over 30 Minutes Intravenous Every 12 hours 02/14/24 2345 02/15/24 0958   02/14/24 2345  metroNIDAZOLE  (FLAGYL ) IVPB 500 mg        500 mg 100 mL/hr over 60 Minutes Intravenous 2 times daily 02/14/24 2334 02/21/24 2159   02/14/24 2115  vancomycin  (VANCOREADY) IVPB 2000 mg/400 mL        2,000 mg 200 mL/hr over 120 Minutes Intravenous  Once 02/14/24 2110 02/15/24 0238   02/14/24 2115  ceFEPIme  (MAXIPIME ) 2 g in sodium chloride  0.9 % 100 mL IVPB        2 g 200 mL/hr over 30 Minutes Intravenous  Once 02/14/24 2110 02/14/24 2235        DVT prophylaxis: Heparin   Code Status: Full code  Family Communication: No family at bedside   CONSULTS    Subjective   Denies shortness of breath.   Objective    Physical Examination:  Appears in no acute distress S1-S2, regular Lungs clear to auscultation bilaterally Abdomen is soft, nontender, no organomegaly  Status is: Inpatient:             [name]   [facility] If 7PM-7AM, please contact night-coverage at [email or web address], Office  [phone]   02/18/2024, 8:09 AM  LOS: 4 days

## 2024-02-18 NOTE — Progress Notes (Signed)
  Progress Note  Patient Name: [name] Date of Encounter: 02/18/2024 [facility] Cardiologist: Gordy Bergamo, MD   Interval Summary   Very uncomfortable from her buttock wound, but no breathing difficulty. Very brisk diuresis after restarting just a low dose of oral loop diuretic. K 3.4, stable renal parameters.  Vital Signs Vitals:   02/17/24 1742 02/17/24 2044 02/18/24 0409 02/18/24 0500  BP: 117/63 133/66 119/63   Pulse: 94 83 89   Resp: 18 14 20    Temp: 98.7 F (37.1 C) 98.9 F (37.2 C) 98.7 F (37.1 C)   TempSrc: Oral Oral Oral   SpO2: 97% 99% 100%   Weight:    (!) 163.7 kg  Height:        Intake/Output Summary (Last 24 hours) at 02/18/2024 0847 Last data filed at 02/18/2024 0600 Gross per 24 hour  Intake 1352.26 ml  Output 8750 ml  Net -7397.74 ml      02/18/2024    5:00 AM 02/17/2024    6:10 AM 02/16/2024    5:00 AM  Last 3 Weights  Weight (lbs) 361 lb 375 lb 10.6 oz 371 lb 11.1 oz  Weight (kg) 163.749 kg 170.4 kg 168.6 kg      Telemetry/ECG  NSR - Personally Reviewed  Physical Exam  GEN: No acute distress. Difficulty finding a comfortable position for her buttock wound. Morbid obesity.  Neck: No JVD Cardiac: RRR, no murmurs, rubs, or gallops.  Respiratory: Clear to auscultation bilaterally. GI: Soft, nontender, non-distended  MS: No edema  Assessment & Plan  Recovering well. Increase Entresto  dose gradually (was on 97/103 preadmission) and increase spironolactone  as well (was on 50 mg daily preadmission). No SGLT2 inh, at least not until her wound heals. Continue current dose of loop diuretic, monitor K and creatinine.   For questions or updates, please contact [facility] Please consult [email or web address] for contact info under       Signed, Jerel Balding, MD

## 2024-02-18 NOTE — TOC Initial Note (Signed)
 Transition of Care [facility]) - Initial/Assessment Note    Patient Details  Name: [name] MRN: [id number] Date of Birth: [birth year]  [facility] CM/SW Contact:    Sonda Manuella Quill, RN Phone Number: 02/18/2024, 4:45 PM  Clinical Narrative:                 [name] w/ pt in room; pt says she lives at home; she plans to return at d/c; she identified POC sister [name] ([phone]); pt says family will provide transportation; pt says she has difficulty paying utilities; she denied other SDOH risks; she has walker, Rollator, cpap, BSC, and shower chair; pt has aide from [facility]; she does not have home oxygen; pt agreed to receive resources for social services and financial assistance; resources placed in d/c instructions; copies of resources given to pt; she will make her own appt w/ agencies of choice; TOC is following.  Expected Discharge Plan: Home w Home Health Services Barriers to Discharge: Continued Medical Work up   Patient Goals and CMS Choice Patient states their goals for this hospitalization and ongoing recovery are:: home          Expected Discharge Plan and Services   Discharge Planning Services: CM Consult   Living arrangements for the past 2 months: Apartment                                      Prior Living Arrangements/Services Living arrangements for the past 2 months: Apartment Lives with:: Self Patient language and need for interpreter reviewed:: Yes Do you feel safe going back to the place where you live?: Yes      Need for Family Participation in Patient Care: Yes (Comment) Care giver support system in place?: Yes (comment) Current home services: DME (walker, Rollator, BSC, shower chair, cpap; HH aide from [facility]) Criminal Activity/Legal Involvement Pertinent to Current Situation/Hospitalization: No - Comment as needed  Activities of Daily Living   ADL Screening (condition at time of admission) Independently  performs ADLs?: No (per patient she has a home aid) Does the patient have a NEW difficulty with bathing/dressing/toileting/self-feeding that is expected to last >3 days?: No Does the patient have a NEW difficulty with getting in/out of bed, walking, or climbing stairs that is expected to last >3 days?: No Does the patient have a NEW difficulty with communication that is expected to last >3 days?: No Is the patient deaf or have difficulty hearing?: No Does the patient have difficulty seeing, even when wearing glasses/contacts?: No Does the patient have difficulty concentrating, remembering, or making decisions?: No  Permission Sought/Granted Permission sought to share information with : Case Manager Permission granted to share information with : Yes, Verbal Permission Granted  Share Information with NAME: Case Manager     Permission granted to share info w Relationship: [name] (sister) [phone]     Emotional Assessment Appearance:: Appears stated age Attitude/Demeanor/Rapport: Gracious Affect (typically observed): Accepting Orientation: : Oriented to Self, Oriented to Place, Oriented to  Time, Oriented to Situation   Psych Involvement: No (comment)  Admission diagnosis:  Weakness [R53.1] Elevated troponin [R79.89] Acute encephalopathy [G93.40] Altered mental status, unspecified altered mental status type [R41.82] Patient Active Problem List   Diagnosis Date Noted   Non-ST elevation (NSTEMI) myocardial infarction (HCC) 02/15/2024   Acute HFrEF (heart failure with reduced ejection fraction) (HCC) 02/15/2024   Acute encephalopathy 02/14/2024   SIRS (  systemic inflammatory response syndrome) (HCC) 02/14/2024   Elevated troponin 02/14/2024   Malnutrition of moderate degree 04/27/2023   Anxiety and depression 04/25/2023   Ileus (HCC) 11/16/2022   Hypokalemia 11/16/2022   Hypomagnesemia 11/16/2022   Hypothyroidism 11/16/2022   Oral candidiasis 11/16/2022   History of venous  thromboembolism 11/16/2022   Severe sepsis (HCC) 11/09/2022   Acute metabolic encephalopathy 10/24/2022   Intra-abdominal abscess (HCC) 10/24/2022   Nausea & vomiting 05/09/2022   Vitamin B12 deficiency 01/04/2022   History of pulmonary embolism 12/22/2021   Normocytic anemia 12/22/2021   Foot pain, bilateral 11/22/2021   Genetic testing 08/17/2021   Family history of breast cancer 07/19/2021   Family history of prostate cancer 07/19/2021   Esophageal dysmotility 06/03/2021   Physical debility 08/29/2019   Obesity hypoventilation syndrome (HCC) 08/23/2019   Skin breakdown 08/23/2019   Liver hemorrhage 08/23/2019   History of CVA (cerebrovascular accident) 08/19/2019   Mood disorder (HCC) 08/19/2019   Morbid obesity with BMI of 50.0-59.9, adult (HCC) 08/19/2019   SLE (systemic lupus erythematosus related syndrome) (HCC) 08/19/2019   Hepatic adenoma    Sciatica 04/20/2018   Type 2 diabetes mellitus with hyperlipidemia (HCC) 09/19/2015   Radiculopathy of cervical spine    Family history of malignant neoplasm of breast    Family history of malignant neoplasm of ovary    Intractable nausea and vomiting 11/11/2013   Septate uterus 09/08/2013   Diabetic gastroparesis (HCC) 04/28/2013   Constipation due to pain medication 04/21/2013   Odynophagia 04/04/2013   Acute renal failure superimposed on stage 3a chronic kidney disease (HCC) 04/01/2013   Liver masses 02/28/2013   Generalized abdominal pain 02/26/2013   Reflux esophagitis 02/26/2013   Morbid obesity (HCC) 10/10/2012   Chronic heart failure with preserved ejection fraction (HFpEF) (HCC) 10/09/2012   Clinical polymyositis syndrome (HCC) 09/06/2012   Mononeuritis of lower limb 01/12/2012   Chronic combined systolic and diastolic CHF (congestive heart failure) (HCC) 11/08/2011   Essential hypertension 11/08/2011   HLD (hyperlipidemia) 07/19/2011   Bell's palsy 08/09/2010   SJOGREN'S SYNDROME 08/09/2010   PCP:  [name], MD Pharmacy:   [id number] - [place], [place] - [address] [place] [address] Phone: [phone] Fax: [phone]  [id number] - [place], [place] - [address] [place] [address] Phone: [phone] Fax: [phone]     Social Drivers of Health (SDOH) Social History: SDOH Screenings   Food Insecurity: No Food Insecurity (02/18/2024)  Recent Concern: Food Insecurity - Medium Risk (02/07/2024)   Received from [facility]  Housing: Low Risk  (02/18/2024)  Recent Concern: Housing - High Risk (02/15/2024)  Transportation Needs: No Transportation Needs (02/18/2024)  Utilities: At Risk (02/18/2024)  Financial Resource Strain: Low Risk  (03/30/2022)   Received from [facility], [facility] visits prior to 10/01/2022.  Social Connections: Unknown (12/11/2022)   Received from [facility]  Stress: No Stress Concern Present (01/30/2024)   Received from [facility]  Tobacco Use: Medium Risk (02/09/2024)   SDOH Interventions: Food Insecurity Interventions: Intervention Not Indicated, Inpatient TOC Housing Interventions: Inpatient TOC Transportation Interventions: Intervention Not Indicated, Inpatient TOC Utilities Interventions: Community Resources Provided, Inpatient TOC   Readmission Risk Interventions    02/18/2024    4:43 PM 04/27/2023    3:50 PM 05/26/2021    4:02 PM  Readmission Risk Prevention Plan  Transportation Screening Complete Complete  Complete  PCP or Specialist Appt within 3-5 Days  Complete   HRI or Home Care Consult  Complete   Social Work Consult for Recovery Care Planning/Counseling  Complete   Palliative Care Screening  Not Applicable   Medication Review Oceanographer) Complete Referral to Pharmacy   PCP or Specialist appointment within 3-5 days of discharge Complete  Complete  HRI or Home Care Consult  Complete  Complete  SW Recovery Care/Counseling Consult Complete  Complete  Palliative Care Screening Not Applicable    Skilled Nursing Facility Not Applicable  Not Applicable

## 2024-02-18 NOTE — Progress Notes (Signed)
 Pharmacy Antibiotic Note  [name] is a 53 y.o. female admitted on 02/14/2024 with sepsis.  Pharmacy has been consulted for vancomycin , cefepime  dosing.  Today, 02/18/24 -WBC trending down -SCr up to 1.01 and estimated AUC for vancomycin  re-calculated  Plan: Continue cefepime  2 g IV q8h Change vancomycin  to 1250 mg IV q12h for estimated AUC of 506.7 using SCr 1.01, Vd 0.5 Goal AUC 400-550. Check levels as needed Monitor renal function, culture data  Height: 5' 9 (175.3 cm) Weight: (!) 163.7 kg (361 lb) IBW/kg (Calculated) : 66.2  Temp (24hrs), Avg:98.6 F (37 C), Min:98.4 F (36.9 C), Max:98.9 F (37.2 C)  Recent Labs  Lab 02/14/24 2011 02/14/24 2103 02/14/24 2104 02/15/24 0505 02/15/24 0953 02/16/24 0245 02/17/24 0652 02/18/24 0314  WBC 22.5*  --   --  19.5*  --  14.1* 13.7* 12.2*  CREATININE 2.03*  --  2.20* 1.55*  --  1.08* 0.99  0.82 1.01*  LATICACIDVEN  --  2.0*  --   --  1.9  --   --   --     Estimated Creatinine Clearance: 107 mL/min (A) (by C-G formula based on SCr of 1.01 mg/dL (H)).    Allergies  Allergen Reactions   Metoclopramide  Other (See Comments)    Developed restless leg, akathisia type limb movements.    Codeine Itching and Rash   Hydrocodone  Rash   Percocet [Oxycodone -Acetaminophen ] Hives and Rash    Tolerates dilaudid , Tolerates acetaminophen      Antimicrobials this admission: cefepime  7/16 >>  vancomycin  7/16 >>   Microbiology results: 7/16 BCx: 1/3 gpr; no BCID panel performed-considered a contaminant 7/16 UCx: 20k E.coli (pan-s)   Eleanor Agent, PharmD, BCPS 02/18/2024 8:49 AM

## 2024-02-18 NOTE — Plan of Care (Signed)
   Problem: Coping: Goal: Ability to adjust to condition or change in health will improve Outcome: Progressing   Problem: Nutritional: Goal: Maintenance of adequate nutrition will improve Outcome: Progressing

## 2024-02-19 DIAGNOSIS — N179 Acute kidney failure, unspecified: Secondary | ICD-10-CM

## 2024-02-19 DIAGNOSIS — G9340 Encephalopathy, unspecified: Secondary | ICD-10-CM | POA: Diagnosis not present

## 2024-02-19 DIAGNOSIS — Z86718 Personal history of other venous thrombosis and embolism: Secondary | ICD-10-CM | POA: Diagnosis not present

## 2024-02-19 DIAGNOSIS — N1831 Chronic kidney disease, stage 3a: Secondary | ICD-10-CM

## 2024-02-19 DIAGNOSIS — R7989 Other specified abnormal findings of blood chemistry: Secondary | ICD-10-CM | POA: Diagnosis not present

## 2024-02-19 DIAGNOSIS — I5021 Acute systolic (congestive) heart failure: Secondary | ICD-10-CM | POA: Diagnosis not present

## 2024-02-19 LAB — BASIC METABOLIC PANEL WITH GFR
Anion gap: 9 (ref 5–15)
BUN: 8 mg/dL (ref 6–20)
CO2: 27 mmol/L (ref 22–32)
Calcium: 8.5 mg/dL — ABNORMAL LOW (ref 8.9–10.3)
Chloride: 102 mmol/L (ref 98–111)
Creatinine, Ser: 0.92 mg/dL (ref 0.44–1.00)
GFR, Estimated: >60 mL/min (ref >60)
Glucose, Bld: 180 mg/dL — ABNORMAL HIGH (ref 70–99)
Potassium: 3.6 mmol/L (ref 3.5–5.1)
Sodium: 138 mmol/L (ref 135–145)

## 2024-02-19 LAB — CBC
HCT: 32.5 % — ABNORMAL LOW (ref 36.0–46.0)
Hemoglobin: 10.2 g/dL — ABNORMAL LOW (ref 12.0–15.0)
MCH: 28.3 pg (ref 26.0–34.0)
MCHC: 31.4 g/dL (ref 30.0–36.0)
MCV: 90 fL (ref 80.0–100.0)
Platelets: 218 K/uL (ref 150–400)
RBC: 3.61 MIL/uL — ABNORMAL LOW (ref 3.87–5.11)
RDW: 14 % (ref 11.5–15.5)
WBC: 12.4 K/uL — ABNORMAL HIGH (ref 4.0–10.5)
nRBC: 0 % (ref 0.0–0.2)

## 2024-02-19 LAB — GLUCOSE, CAPILLARY
Glucose-Capillary: 189 mg/dL — ABNORMAL HIGH (ref 70–99)
Glucose-Capillary: 212 mg/dL — ABNORMAL HIGH (ref 70–99)
Glucose-Capillary: 244 mg/dL — ABNORMAL HIGH (ref 70–99)
Glucose-Capillary: 234 mg/dL — ABNORMAL HIGH (ref 70–99)

## 2024-02-19 LAB — HEMOGLOBIN A1C
Hgb A1c MFr Bld: 7.3 % — ABNORMAL HIGH (ref 4.8–5.6)
Mean Plasma Glucose: 162.81 mg/dL

## 2024-02-19 MED ORDER — DOXYCYCLINE HYCLATE 100 MG PO TABS
100.0000 mg | ORAL_TABLET | Freq: Two times a day (BID) | ORAL | Status: DC
  Administered 2024-02-19 – 2024-02-26 (×15): 100 mg via ORAL
  Filled 2024-02-19 (×15): qty 1

## 2024-02-19 NOTE — Progress Notes (Addendum)
 Progress Note  Patient Name: [name] Date of Encounter: 02/19/2024  [facility] Cardiologist: Gordy Bergamo, MD    Interval Summary    Patient reports ongoing issues with nausea. Trying her best to keep her cardiac medications down. Denies chest pain or shortness of breath. Is constipated, received a laxative this AM . Has pain near her buttock wounds   Vital Signs Vitals:   02/18/24 1934 02/19/24 0030 02/19/24 0404 02/19/24 0500  BP: 118/74 (!) 185/131 112/70   Pulse: (!) 101 90 96   Resp: 20   17  Temp: 98.8 F (37.1 C) 98.7 F (37.1 C) 98.8 F (37.1 C)   TempSrc: Oral Oral Oral   SpO2: 99% 100% 100%   Weight:   (!) 158.3 kg   Height:        Intake/Output Summary (Last 24 hours) at 02/19/2024 0859 Last data filed at 02/18/2024 2315 Gross per 24 hour  Intake 690 ml  Output 5650 ml  Net -4960 ml      02/19/2024    4:04 AM 02/18/2024    5:00 AM 02/17/2024    6:10 AM  Last 3 Weights  Weight (lbs) 348 lb 15.8 oz 361 lb 375 lb 10.6 oz  Weight (kg) 158.3 kg 163.749 kg 170.4 kg      Telemetry/ECG  Nsr with occasional PVC - Personally Reviewed  Physical Exam  GEN: No acute distress.  Laying in the bed with head elevated  Neck: No JVD Cardiac:  RRR, no murmurs, rubs, or gallops.  Respiratory: Clear to auscultation bilaterally. Breathing unlabored  GI: Soft, mildly tender to palpation  MS: No edema in BLE   Assessment & Plan   Elevated Troponin  - Patient denies chest pain, shortness of breath, orthopnea. Denies recent chest pain dyspnea on exertion, though she if fairly inactive. Spends most of her time in bed  - hsTn peaked at 1370. Had a fairly flat trend. Cath in 2017 showed normal coronary arteries  - Given her acute illness, intermittent ability to keep down medications (has diabetic gastroparesis), resolving acute on chronic renal failure, not a good time to pursue cardiac catheterization. Recommend medical management of CHF and possible CAD -  Consider cath as an outpatient if EF remains reduced or if she develops ischemic symptoms - Not on ASA due to eliquis  use   Acute HFrEF  HTN  - EF previously as low as 30-35%. Cath in 2017 showed normal coronary arteries so has been treated for nonischemic cardiomyopathy with GDMT. Echo from 09/2022 showed EF 55-60%, mild LVH, normal RV systolic function, normal PA systolic pressure - Echo this admission with EF 35-40%, wall motion abnormalities consistent with Takotsubo cardiomyopathy vs LAD infarct.  - As above, patient denies chest pain. EKG does not show acute ischemic changes. Not cath candidate at this time  -  GDMT initially held for low BP and AKI. Now back on entresto  49-51 mg BID, metoprolol  succinate 25 mg daily, spironolactone  12.5 mg daily  - BP and renal function tolerating GDMT. Continue current regiment  - No SGLT2i until her wounds heal  - Continue PO torsemide  20 mg daily    AKI on CKD  - Creatinine peaked at  2.20 on 7/16. Now down to 0.92   History of DVT  - Continue eliquis  2.5 mg BID    Otherwise per primary  - Acute encephalopathy  - SIRS  - Insulin -dependent type 2 DM with gastroparesis  - chronic anemia  - Hypothyroidism  -  Chronic back pain  - SLE/Sjogren's syndrome     For questions or updates, please contact [facility] Please consult [email or web address] for contact info under       Signed, Rollo FABIENE Louder, PA-C   Patient seen and examined with KJ PA-C.  Agree as above, with the following exceptions and changes as noted below. She is in pain from her wounds but no respiratory symptoms. Vigorous UOP. Gen: NAD, CV: RRR, no murmurs, Lungs: clear, Abd: soft, Extrem: Warm, well perfused, puffy LE edema, Neuro/Psych: alert and oriented x 3, normal mood and affect. All available labs, radiology testing, previous records reviewed. Continue with current mediation regimen, she is diuresing briskly with normal renal function and electrolytes, follow daily.    Soyla DELENA Merck, MD 02/19/24 10:11 AM

## 2024-02-19 NOTE — Progress Notes (Addendum)
 [facility]                                                                               [name], is a 53 y.o. female, DOB - 09-03-1970, FMW:[id number] Admit date - 02/14/2024    Outpatient Primary MD for the patient is [name], MD  LOS - 5  days    Brief summary    53 y.o. female with medical history significant for chronic HFpEF, history of VTE on Eliquis , insulin -dependent T2DM, SLE, Sjogren's syndrome, HTN, gastroparesis, hypothyroidism, anemia, chronic back pain, morbid obesity who presented to the ED for evaluation of altered mental status  Patient recently admitted at [facility] from 6/30-7/8 for intractable nausea and vomiting related to gastroparesis. She had associated AKI. She was managed with antiemetics with improvement in nausea vomiting. She was also complaining of acute on chronic back pain.    Assessment & Plan    Assessment and Plan:  Acute encephalopathy Resolved.  Suspect from polypharmacy.  CT head negative.    New onset systolic CHF/ Acute HRrEF/ elevated troponins Cardiology on board.  ON torsemide , entresto   and spironolactone .  Echo this admission shows Repeat echocardiogram done on 02/15/2024 showed EF of 35 to 40%    SIRS From Pressure injury to left upper inner buttocks unstageable  She was initially started on broad spectrum IV antibiotics and narrowed to oral doxycycline .  She also received 3 days of IV cefepime  for e coli in the urine.  Wound care consult for buttocks wound.    AKI - creatinine on admission is 2, improved with diuresis.  US  rena is negative for hydronephrosis.    H/o DVT On eliquis .   Body mass index is 51.54 kg/m. Obesity class III   Insulin  dependent type 2 DM CBG (last 3)  Recent Labs    02/18/24 1936 02/19/24 0739 02/19/24 1121  GLUCAP 175* 189* 212*   Resume SSI.  Get  A1c    Hypothyroidism Resume synthroid .    Chronic normocytic anemia Hemoglobin  stable.    SLE/ Sjogren's syndrome    Estimated body mass index is 51.54 kg/m as calculated from the following:   Height as of this encounter: 5' 9 (1.753 m).   Weight as of this encounter: 158.3 kg.  Code Status: full code.  DVT Prophylaxis:  apixaban  (ELIQUIS ) tablet 2.5 mg Start: 02/16/24 1700 apixaban  (ELIQUIS ) tablet 2.5 mg   Level of Care: Level of care: Progressive Family Communication: none at bedside.   Disposition Plan:     Remains inpatient appropriate:  pending clinical improvement.   Procedures:  None.   Consultants:   Cardiology.   Antimicrobials:   Anti-infectives (From admission, onward)    Start     Dose/Rate Route Frequency Ordered Stop   02/19/24 1330  doxycycline  (VIBRA -TABS) tablet 100 mg        100 mg Oral Every 12 hours 02/19/24 1232     02/18/24 1000  vancomycin  (VANCOREADY) IVPB 1250 mg/250 mL  Status:  Discontinued        1,250 mg 166.7 mL/hr over 90 Minutes Intravenous Every 12 hours 02/18/24 0849 02/18/24 1320   02/17/24 2000  vancomycin  (VANCOREADY) IVPB 1500 mg/300 mL  Status:  Discontinued        1,500 mg 150 mL/hr over 120 Minutes Intravenous Every 12 hours 02/17/24 1101 02/18/24 0849   02/16/24 2000  vancomycin  (VANCOREADY) IVPB 1250 mg/250 mL  Status:  Discontinued        1,250 mg 166.7 mL/hr over 90 Minutes Intravenous Every 12 hours 02/16/24 0932 02/17/24 1101   02/15/24 2200  vancomycin  (VANCOCIN ) IVPB 1000 mg/200 mL premix  Status:  Discontinued        1,000 mg 200 mL/hr over 60 Minutes Intravenous Every 24 hours 02/14/24 2345 02/15/24 0958   02/15/24 2200  vancomycin  (VANCOREADY) IVPB 1750 mg/350 mL  Status:  Discontinued        1,750 mg 175 mL/hr over 120 Minutes Intravenous Every 24 hours 02/15/24 0958 02/16/24 0932   02/15/24 1000  ceFEPIme  (MAXIPIME ) 2 g in sodium chloride  0.9 % 100 mL IVPB  Status:  Discontinued        2 g 200 mL/hr over 30 Minutes Intravenous Every 8 hours 02/15/24 0958 02/19/24 1232   02/15/24 0900   ceFEPIme  (MAXIPIME ) 2 g in sodium chloride  0.9 % 100 mL IVPB  Status:  Discontinued        2 g 200 mL/hr over 30 Minutes Intravenous Every 12 hours 02/14/24 2345 02/15/24 0958   02/14/24 2345  metroNIDAZOLE  (FLAGYL ) IVPB 500 mg  Status:  Discontinued        500 mg 100 mL/hr over 60 Minutes Intravenous 2 times daily 02/14/24 2334 02/19/24 1232   02/14/24 2115  vancomycin  (VANCOREADY) IVPB 2000 mg/400 mL        2,000 mg 200 mL/hr over 120 Minutes Intravenous  Once 02/14/24 2110 02/15/24 0238   02/14/24 2115  ceFEPIme  (MAXIPIME ) 2 g in sodium chloride  0.9 % 100 mL IVPB        2 g 200 mL/hr over 30 Minutes Intravenous  Once 02/14/24 2110 02/14/24 2235        Medications  Scheduled Meds:  apixaban   2.5 mg Oral BID   Chlorhexidine  Gluconate Cloth  6 each Topical Daily   collagenase    Topical Daily   doxycycline   100 mg Oral Q12H   feeding supplement  237 mL Oral BID BM   insulin  aspart  0-5 Units Subcutaneous QHS   insulin  aspart  0-9 Units Subcutaneous TID WC   levothyroxine   50 mcg Oral Q0600   metoprolol  succinate  25 mg Oral Daily   naLOXone  (NARCAN )  injection  0.4 mg Intravenous Once   sacubitril -valsartan   1 tablet Oral BID   sodium chloride  flush  10-40 mL Intracatheter Q12H   sodium chloride  flush  3 mL Intravenous Q12H   spironolactone   12.5 mg Oral Daily   torsemide   20 mg Oral Daily   Continuous Infusions: PRN Meds:.acetaminophen  **OR** acetaminophen , bisacodyl , morphine  injection, prochlorperazine , senna-docusate, sodium chloride  flush, traMADol     Subjective:   [name] was seen and examined today.    Objective:   Vitals:   02/19/24 0030 02/19/24 0404 02/19/24 0500 02/19/24 1207  BP: (!) 185/131 112/70  130/67  Pulse: 90 96  95  Resp:   17   Temp: 98.7 F (37.1 C) 98.8 F (37.1 C)  99.4 F (37.4 C)  TempSrc: Oral Oral  Oral  SpO2: 100% 100%  96%  Weight:  (!) 158.3 kg    Height:        Intake/Output Summary (Last 24 hours) at 02/19/2024  1448  Last data filed at 02/19/2024 1251 Gross per 24 hour  Intake 1113 ml  Output 2450 ml  Net -1337 ml   Filed Weights   02/17/24 0610 02/18/24 0500 02/19/24 0404  Weight: (!) 170.4 kg (!) 163.7 kg (!) 158.3 kg     Exam General exam: Appears calm and comfortable  Respiratory system: Clear to auscultation. Respiratory effort normal. Cardiovascular system: S1 & S2 heard, RRR. No JVD,  Gastrointestinal system: Abdomen is nondistended, soft and nontender. Central nervous system: Alert and oriented.  Extremities: Symmetric 5 x 5 power. Skin: No rashes,  Psychiatry: Mood & affect appropriate.    Data Reviewed:  I have personally reviewed following labs and imaging studies   CBC Lab Results  Component Value Date   WBC 12.4 (H) 02/19/2024   RBC 3.61 (L) 02/19/2024   HGB 10.2 (L) 02/19/2024   HCT 32.5 (L) 02/19/2024   MCV 90.0 02/19/2024   MCH 28.3 02/19/2024   PLT 218 02/19/2024   MCHC 31.4 02/19/2024   RDW 14.0 02/19/2024   LYMPHSABS 1.4 02/14/2024   MONOABS 2.4 (H) 02/14/2024   EOSABS 0.1 02/14/2024   BASOSABS 0.0 02/14/2024     Last metabolic panel Lab Results  Component Value Date   NA 138 02/19/2024   K 3.6 02/19/2024   CL 102 02/19/2024   CO2 27 02/19/2024   BUN 8 02/19/2024   CREATININE 0.92 02/19/2024   GLUCOSE 180 (H) 02/19/2024   GFRNONAA >60 02/19/2024   GFRAA >60 02/07/2020   CALCIUM  8.5 (L) 02/19/2024   PHOS 4.6 11/17/2022   PROT 5.2 (L) 02/16/2024   ALBUMIN  2.4 (L) 02/16/2024   LABGLOB 2.5 12/30/2021   BILITOT 1.3 (H) 02/16/2024   ALKPHOS 53 02/16/2024   AST 18 02/16/2024   ALT 13 02/16/2024   ANIONGAP 9 02/19/2024    CBG (last 3)  Recent Labs    02/18/24 1936 02/19/24 0739 02/19/24 1121  GLUCAP 175* 189* 212*      Coagulation Profile: Recent Labs  Lab 02/15/24 0234  INR 1.3*     Radiology Studies: No results found.     [name] M.D. [facility] 02/19/2024, 2:48 PM  Available via Epic secure chat  7am-7pm After 7 pm, please refer to night coverage provider listed on amion.

## 2024-02-19 NOTE — Evaluation (Signed)
 Physical Therapy Evaluation Patient Details Name: [name] MRN: [id number] DOB: Aug 05, 1970 Today's Date: 02/19/2024  History of Present Illness  Pt is 53 yo female admitted on 02/14/24 with acute encephalopathy (likely in setting of polypharmacy), SIRS, buttock wounds, AKI, and CHF.  Pt with hx including but not limited to chronic HFpEF, history of VTE on Eliquis , insulin -dependent T2DM, SLE, Sjogren's syndrome, HTN, gastroparesis, hypothyroidism, anemia, chronic back pain, morbid obesity  Clinical Impression  Pt admitted with above diagnosis. At baseline, pt lives alone and has aide 2 hr/day.  She has assist with all adls and is limited household ambulatory with rollator.  Today, pt only able to sit EOB with assist of 2 for transfers.  Limited by generalized weakness and pain from wounds.  Pt is below her baseline.   Pt currently with functional limitations due to the deficits listed below (see PT Problem List). Pt will benefit from acute skilled PT to increase their independence and safety with mobility to allow discharge.  Patient will benefit from continued inpatient follow up therapy, <3 hours/day at d/c.          If plan is discharge home, recommend the following: A lot of help with walking and/or transfers;A lot of help with bathing/dressing/bathroom   Can travel by private vehicle   No    Equipment Recommendations None recommended by PT  Recommendations for Other Services       Functional Status Assessment Patient has had a recent decline in their functional status and demonstrates the ability to make significant improvements in function in a reasonable and predictable amount of time.     Precautions / Restrictions Precautions Precautions: Fall Precaution/Restrictions Comments: buttock wounds - painful      Mobility  Bed Mobility Overal bed mobility: Needs Assistance Bed Mobility: Supine to Sit, Sit to Supine     Supine to sit: Mod assist, +2 for physical  assistance Sit to supine: Mod assist, +2 for physical assistance   General bed mobility comments: increased time, use of rails, on pressure relief mattress    Transfers                   General transfer comment: declined standing or lateral scoots    Ambulation/Gait                  Stairs            Wheelchair Mobility     Tilt Bed    Modified Rankin (Stroke Patients Only)       Balance Overall balance assessment: Needs assistance Sitting-balance support: Single extremity supported Sitting balance-Leahy Scale: Poor Sitting balance - Comments: Needs at least single UE support to maintain balance with close supervision; EOB for 15 mins                                     Pertinent Vitals/Pain Pain Assessment Pain Assessment: Faces Faces Pain Scale: Hurts even more Pain Location: buttock wounds w transfers Pain Descriptors / Indicators: Grimacing Pain Intervention(s): Limited activity within patient's tolerance, Monitored during session, Premedicated before session, Repositioned    Home Living Family/patient expects to be discharged to:: Private residence Living Arrangements: Alone Available Help at Discharge: Personal care attendant (2 hr day, 7 day week) Type of Home: Apartment Home Access: Level entry       Home Layout: One level Home Equipment: Agricultural consultant (2 wheels);Rollator (4  wheels);Tub bench      Prior Function Prior Level of Function : Needs assist             Mobility Comments: limited household ambulator with Rollator ADLs Comments: Has assist with all ADLs and IADLs     Extremity/Trunk Assessment   Upper Extremity Assessment Upper Extremity Assessment: Generalized weakness    Lower Extremity Assessment Lower Extremity Assessment: LLE deficits/detail;RLE deficits/detail RLE Deficits / Details: ROM WFL; MMT grossly 3/5 throughout LLE Deficits / Details: ROM WFL; MMT grossly 2/5 throughout  (reports weaker at baseline on L)    Cervical / Trunk Assessment Cervical / Trunk Assessment: Other exceptions;Kyphotic Cervical / Trunk Exceptions: increased body habitus  Communication        Cognition Arousal: Alert Behavior During Therapy: Flat affect   PT - Cognitive impairments: Safety/Judgement                       PT - Cognition Comments: decreased insight to deficits/ability to return home         Cueing       General Comments General comments (skin integrity, edema, etc.): buttock wounds per chart - painful with movement    Exercises General Exercises - Lower Extremity Ankle Circles/Pumps: AROM, Both, 5 reps, Seated Long Arc Quad: AROM, Both, Seated, 5 reps   Assessment/Plan    PT Assessment Patient needs continued PT services  PT Problem List Decreased strength;Cardiopulmonary status limiting activity;Pain;Decreased range of motion;Decreased activity tolerance;Decreased knowledge of use of DME;Decreased balance;Decreased safety awareness;Decreased mobility       PT Treatment Interventions DME instruction;Therapeutic exercise;Gait training;Balance training;Functional mobility training;Cognitive remediation;Therapeutic activities;Patient/family education;Wheelchair mobility training    PT Goals (Current goals can be found in the Care Plan section)  Acute Rehab PT Goals Patient Stated Goal: return home PT Goal Formulation: With patient Time For Goal Achievement: 03/04/24 Potential to Achieve Goals: Good    Frequency Min 2X/week     Co-evaluation               AM-PAC PT 6 Clicks Mobility  Outcome Measure Help needed turning from your back to your side while in a flat bed without using bedrails?: A Lot Help needed moving from lying on your back to sitting on the side of a flat bed without using bedrails?: Total Help needed moving to and from a bed to a chair (including a wheelchair)?: Total Help needed standing up from a chair using  your arms (e.g., wheelchair or bedside chair)?: Total Help needed to walk in hospital room?: Total Help needed climbing 3-5 steps with a railing? : Total 6 Click Score: 7    End of Session Equipment Utilized During Treatment: Oxygen Activity Tolerance: Patient limited by pain;Patient limited by fatigue Patient left: in bed;with call bell/phone within reach Nurse Communication: Mobility status PT Visit Diagnosis: Other abnormalities of gait and mobility (R26.89);Muscle weakness (generalized) (M62.81)    Time: 8940-8881 PT Time Calculation (min) (ACUTE ONLY): 19 min   Charges:   PT Evaluation $PT Eval Low Complexity: 1 Low   PT General Charges $$ ACUTE PT VISIT: 1 Visit         Benjiman, PT Acute Rehab [facility] [phone]   Benjiman VEAR Mulberry 02/19/2024, 11:54 AM

## 2024-02-20 ENCOUNTER — Inpatient Hospital Stay (HOSPITAL_COMMUNITY)

## 2024-02-20 ENCOUNTER — Encounter (HOSPITAL_COMMUNITY): Payer: Self-pay | Admitting: Internal Medicine

## 2024-02-20 DIAGNOSIS — I5021 Acute systolic (congestive) heart failure: Secondary | ICD-10-CM | POA: Diagnosis not present

## 2024-02-20 DIAGNOSIS — G9340 Encephalopathy, unspecified: Secondary | ICD-10-CM | POA: Diagnosis not present

## 2024-02-20 DIAGNOSIS — N179 Acute kidney failure, unspecified: Secondary | ICD-10-CM | POA: Diagnosis not present

## 2024-02-20 DIAGNOSIS — R7989 Other specified abnormal findings of blood chemistry: Secondary | ICD-10-CM | POA: Diagnosis not present

## 2024-02-20 DIAGNOSIS — Z86718 Personal history of other venous thrombosis and embolism: Secondary | ICD-10-CM | POA: Diagnosis not present

## 2024-02-20 LAB — CULTURE, BLOOD (ROUTINE X 2): Culture: NO GROWTH

## 2024-02-20 LAB — CBC WITH DIFFERENTIAL/PLATELET
Abs Immature Granulocytes: 0.08 K/uL — ABNORMAL HIGH (ref 0.00–0.07)
Basophils Absolute: 0.1 K/uL (ref 0.0–0.1)
Basophils Relative: 0 %
Eosinophils Absolute: 0.6 K/uL — ABNORMAL HIGH (ref 0.0–0.5)
Eosinophils Relative: 4 %
HCT: 32.6 % — ABNORMAL LOW (ref 36.0–46.0)
Hemoglobin: 10.2 g/dL — ABNORMAL LOW (ref 12.0–15.0)
Immature Granulocytes: 1 %
Lymphocytes Relative: 20 %
Lymphs Abs: 2.7 K/uL (ref 0.7–4.0)
MCH: 28.3 pg (ref 26.0–34.0)
MCHC: 31.3 g/dL (ref 30.0–36.0)
MCV: 90.6 fL (ref 80.0–100.0)
Monocytes Absolute: 1.9 K/uL — ABNORMAL HIGH (ref 0.1–1.0)
Monocytes Relative: 14 %
Neutro Abs: 8.3 K/uL — ABNORMAL HIGH (ref 1.7–7.7)
Neutrophils Relative %: 61 %
Platelets: 249 K/uL (ref 150–400)
RBC: 3.6 MIL/uL — ABNORMAL LOW (ref 3.87–5.11)
RDW: 14.6 % (ref 11.5–15.5)
WBC: 13.6 K/uL — ABNORMAL HIGH (ref 4.0–10.5)
nRBC: 0 % (ref 0.0–0.2)

## 2024-02-20 LAB — MAGNESIUM: Magnesium: 1.4 mg/dL — ABNORMAL LOW (ref 1.7–2.4)

## 2024-02-20 LAB — BASIC METABOLIC PANEL WITH GFR
Anion gap: 11 (ref 5–15)
BUN: 12 mg/dL (ref 6–20)
CO2: 29 mmol/L (ref 22–32)
Calcium: 8.6 mg/dL — ABNORMAL LOW (ref 8.9–10.3)
Chloride: 96 mmol/L — ABNORMAL LOW (ref 98–111)
Creatinine, Ser: 0.95 mg/dL (ref 0.44–1.00)
GFR, Estimated: >60 mL/min (ref >60)
Glucose, Bld: 254 mg/dL — ABNORMAL HIGH (ref 70–99)
Potassium: 3.2 mmol/L — ABNORMAL LOW (ref 3.5–5.1)
Sodium: 136 mmol/L (ref 135–145)

## 2024-02-20 LAB — GLUCOSE, CAPILLARY
Glucose-Capillary: 269 mg/dL — ABNORMAL HIGH (ref 70–99)
Glucose-Capillary: 303 mg/dL — ABNORMAL HIGH (ref 70–99)
Glucose-Capillary: 275 mg/dL — ABNORMAL HIGH (ref 70–99)

## 2024-02-20 MED ORDER — MAGNESIUM SULFATE 4 GM/100ML IV SOLN
4.0000 g | Freq: Once | INTRAVENOUS | Status: AC
  Administered 2024-02-20: 4 g via INTRAVENOUS
  Filled 2024-02-20: qty 100

## 2024-02-20 MED ORDER — DIPHENHYDRAMINE HCL 25 MG PO CAPS: 25.0000 mg | ORAL_CAPSULE | Freq: Four times a day (QID) | ORAL | Status: DC | PRN

## 2024-02-20 MED ORDER — SPIRONOLACTONE 25 MG PO TABS
25.0000 mg | ORAL_TABLET | Freq: Every day | ORAL | Status: DC
  Administered 2024-02-21 – 2024-02-27 (×7): 25 mg via ORAL
  Filled 2024-02-20 (×7): qty 1

## 2024-02-20 MED ORDER — SENNOSIDES-DOCUSATE SODIUM 8.6-50 MG PO TABS
2.0000 | ORAL_TABLET | Freq: Two times a day (BID) | ORAL | Status: DC
  Administered 2024-02-20 – 2024-02-29 (×17): 2 via ORAL
  Filled 2024-02-20 (×18): qty 2

## 2024-02-20 MED ORDER — INSULIN GLARGINE-YFGN 100 UNIT/ML ~~LOC~~ SOLN
20.0000 [IU] | Freq: Two times a day (BID) | SUBCUTANEOUS | Status: DC
  Administered 2024-02-20 – 2024-02-21 (×2): 20 [IU] via SUBCUTANEOUS
  Filled 2024-02-20 (×3): qty 0.2

## 2024-02-20 MED ORDER — POLYETHYLENE GLYCOL 3350 17 G PO PACK
17.0000 g | PACK | Freq: Every day | ORAL | Status: DC
  Administered 2024-02-20 – 2024-02-29 (×5): 17 g via ORAL
  Filled 2024-02-20 (×8): qty 1

## 2024-02-20 MED ORDER — METOCLOPRAMIDE HCL 10 MG PO TABS: 5.0000 mg | ORAL_TABLET | Freq: Four times a day (QID) | ORAL | Status: DC | PRN

## 2024-02-20 MED ORDER — BISACODYL 10 MG RE SUPP: 10.0000 mg | Freq: Once | RECTAL | Status: DC

## 2024-02-20 MED ORDER — POTASSIUM CHLORIDE CRYS ER 20 MEQ PO TBCR
40.0000 meq | EXTENDED_RELEASE_TABLET | Freq: Two times a day (BID) | ORAL | Status: AC
  Administered 2024-02-20 (×2): 40 meq via ORAL
  Filled 2024-02-20 (×2): qty 2

## 2024-02-20 MED ORDER — INSULIN GLARGINE-YFGN 100 UNIT/ML ~~LOC~~ SOLN
20.0000 [IU] | Freq: Every day | SUBCUTANEOUS | Status: DC
  Administered 2024-02-20: 20 [IU] via SUBCUTANEOUS
  Filled 2024-02-20: qty 0.2

## 2024-02-20 MED ORDER — INSULIN ASPART 100 UNIT/ML IJ SOLN
0.0000 [IU] | Freq: Three times a day (TID) | INTRAMUSCULAR | Status: DC
  Administered 2024-02-21: 11 [IU] via SUBCUTANEOUS
  Administered 2024-02-21 – 2024-02-22 (×3): 7 [IU] via SUBCUTANEOUS
  Administered 2024-02-22: 11 [IU] via SUBCUTANEOUS
  Administered 2024-02-22 – 2024-02-23 (×3): 4 [IU] via SUBCUTANEOUS
  Administered 2024-02-23: 3 [IU] via SUBCUTANEOUS
  Administered 2024-02-24: 4 [IU] via SUBCUTANEOUS
  Administered 2024-02-24 (×2): 3 [IU] via SUBCUTANEOUS
  Administered 2024-02-25 (×2): 7 [IU] via SUBCUTANEOUS
  Administered 2024-02-26: 4 [IU] via SUBCUTANEOUS
  Administered 2024-02-27: 7 [IU] via SUBCUTANEOUS
  Administered 2024-02-27: 3 [IU] via SUBCUTANEOUS
  Administered 2024-02-28: 4 [IU] via SUBCUTANEOUS
  Administered 2024-02-28: 3 [IU] via SUBCUTANEOUS
  Administered 2024-02-28: 4 [IU] via SUBCUTANEOUS
  Administered 2024-02-29 (×2): 3 [IU] via SUBCUTANEOUS

## 2024-02-20 MED ORDER — INSULIN ASPART 100 UNIT/ML IJ SOLN
0.0000 [IU] | Freq: Three times a day (TID) | INTRAMUSCULAR | Status: DC
  Administered 2024-02-20: 11 [IU] via SUBCUTANEOUS

## 2024-02-20 MED ORDER — INSULIN GLARGINE-YFGN 100 UNIT/ML ~~LOC~~ SOPN
20.0000 [IU] | PEN_INJECTOR | SUBCUTANEOUS | Status: DC
  Filled 2024-02-20: qty 3

## 2024-02-20 MED ORDER — ERYTHROMYCIN BASE 250 MG PO TABS
250.0000 mg | ORAL_TABLET | Freq: Three times a day (TID) | ORAL | Status: DC
  Administered 2024-02-20 – 2024-02-27 (×30): 250 mg via ORAL
  Filled 2024-02-20 (×32): qty 1

## 2024-02-20 NOTE — Progress Notes (Signed)
 Heart Failure Navigator Progress Note  Assessed for Heart & Vascular TOC clinic readiness.  Patient does not meet criteria due to has a scheduled [facility] appointment on 03/13/2024. Patient will be discharged to SNF. No HF TOC. .   Navigator will sign off at this time.   Stephane Haddock, BSN, Scientist, clinical (histocompatibility and immunogenetics) Only

## 2024-02-20 NOTE — Progress Notes (Signed)
 [facility]                                                                               [name], is a 53 y.o. female, DOB - [birth year], FMW:[id number] Admit date - 02/14/2024    Outpatient Primary MD for the patient is [name] Madelin Patch, MD  LOS - 6  days    Brief summary    53 y.o. female with medical history significant for chronic HFpEF, history of VTE on Eliquis , insulin -dependent T2DM, SLE, Sjogren's syndrome, HTN, gastroparesis, hypothyroidism, anemia, chronic back pain, morbid obesity who presented to the ED for evaluation of altered mental status  Patient recently admitted at [facility] from 6/30-7/8 for intractable nausea and vomiting related to gastroparesis. She had associated AKI. She was managed with antiemetics with improvement in nausea vomiting. She was also complaining of acute on chronic back pain.    Assessment & Plan    Assessment and Plan:  Acute encephalopathy Resolved.  Suspect from polypharmacy.  CT head negative.    New onset systolic CHF/ Acute HRrEF/ elevated troponins Cardiology on board.  ON torsemide , entresto , metoprolol  succinate  and spironolactone .  Echo this admission shows Repeat echocardiogram done on 02/15/2024 showed EF of 35 to 40%    Hypokalemia Replaced. Check magnesium  levels.   SIRS From Pressure injury to left upper inner buttocks unstageable  She was initially started on broad spectrum IV antibiotics and narrowed to oral doxycycline .  Afebrile, but with persistent leukocytosis.  She also received 3 days of IV cefepime  for e coli in the urine.  Wound care consult for buttocks wound.    AKI - creatinine on admission is 2, improved with diuresis.  US  rena is negative for hydronephrosis.    H/o DVT On eliquis .   Body mass index is 51.54 kg/m. Obesity class III   Insulin  dependent type 2 DM, uncontrolled.  CBG (last 3)  Recent Labs    02/19/24 2002 02/20/24 0721 02/20/24 1155   GLUCAP 234* 269* 303*   Increase semglee  to 20 units bid. Change to resistant SSI.  Get  A1c 7.3    Hypothyroidism Resume synthroid .    Chronic normocytic anemia Hemoglobin stable.    SLE/ Sjogren's syndrome  Nausea , no vomiting. Suspect from gastroparesis.  Will start her on erythromycin  to see if that will help.  Unfortunately she cannot tolerate reglan .     Estimated body mass index is 51.54 kg/m as calculated from the following:   Height as of this encounter: 5' 9 (1.753 m).   Weight as of this encounter: 158.3 kg.  Code Status: full code.  DVT Prophylaxis:  apixaban  (ELIQUIS ) tablet 2.5 mg Start: 02/16/24 1700 apixaban  (ELIQUIS ) tablet 2.5 mg   Level of Care: Level of care: Progressive Family Communication: none at bedside.   Disposition Plan:     Remains inpatient appropriate:  pending clinical improvement.   Procedures:  None.   Consultants:   Cardiology.   Antimicrobials:   Anti-infectives (From admission, onward)    Start     Dose/Rate Route Frequency Ordered Stop   02/19/24 1330  doxycycline  (VIBRA -TABS) tablet 100 mg  100 mg Oral Every 12 hours 02/19/24 1232     02/18/24 1000  vancomycin  (VANCOREADY) IVPB 1250 mg/250 mL  Status:  Discontinued        1,250 mg 166.7 mL/hr over 90 Minutes Intravenous Every 12 hours 02/18/24 0849 02/18/24 1320   02/17/24 2000  vancomycin  (VANCOREADY) IVPB 1500 mg/300 mL  Status:  Discontinued        1,500 mg 150 mL/hr over 120 Minutes Intravenous Every 12 hours 02/17/24 1101 02/18/24 0849   02/16/24 2000  vancomycin  (VANCOREADY) IVPB 1250 mg/250 mL  Status:  Discontinued        1,250 mg 166.7 mL/hr over 90 Minutes Intravenous Every 12 hours 02/16/24 0932 02/17/24 1101   02/15/24 2200  vancomycin  (VANCOCIN ) IVPB 1000 mg/200 mL premix  Status:  Discontinued        1,000 mg 200 mL/hr over 60 Minutes Intravenous Every 24 hours 02/14/24 2345 02/15/24 0958   02/15/24 2200  vancomycin  (VANCOREADY) IVPB 1750  mg/350 mL  Status:  Discontinued        1,750 mg 175 mL/hr over 120 Minutes Intravenous Every 24 hours 02/15/24 0958 02/16/24 0932   02/15/24 1000  ceFEPIme  (MAXIPIME ) 2 g in sodium chloride  0.9 % 100 mL IVPB  Status:  Discontinued        2 g 200 mL/hr over 30 Minutes Intravenous Every 8 hours 02/15/24 0958 02/19/24 1232   02/15/24 0900  ceFEPIme  (MAXIPIME ) 2 g in sodium chloride  0.9 % 100 mL IVPB  Status:  Discontinued        2 g 200 mL/hr over 30 Minutes Intravenous Every 12 hours 02/14/24 2345 02/15/24 0958   02/14/24 2345  metroNIDAZOLE  (FLAGYL ) IVPB 500 mg  Status:  Discontinued        500 mg 100 mL/hr over 60 Minutes Intravenous 2 times daily 02/14/24 2334 02/19/24 1232   02/14/24 2115  vancomycin  (VANCOREADY) IVPB 2000 mg/400 mL        2,000 mg 200 mL/hr over 120 Minutes Intravenous  Once 02/14/24 2110 02/15/24 0238   02/14/24 2115  ceFEPIme  (MAXIPIME ) 2 g in sodium chloride  0.9 % 100 mL IVPB        2 g 200 mL/hr over 30 Minutes Intravenous  Once 02/14/24 2110 02/14/24 2235        Medications  Scheduled Meds:  apixaban   2.5 mg Oral BID   bisacodyl   10 mg Rectal Once   Chlorhexidine  Gluconate Cloth  6 each Topical Daily   collagenase    Topical Daily   doxycycline   100 mg Oral Q12H   feeding supplement  237 mL Oral BID BM   insulin  aspart  0-15 Units Subcutaneous TID WC   insulin  aspart  0-5 Units Subcutaneous QHS   insulin  glargine-yfgn  20 Units Subcutaneous Daily   levothyroxine   50 mcg Oral Q0600   metoprolol  succinate  25 mg Oral Daily   naLOXone  (NARCAN )  injection  0.4 mg Intravenous Once   polyethylene glycol  17 g Oral Daily   potassium chloride   40 mEq Oral BID   sacubitril -valsartan   1 tablet Oral BID   senna-docusate  2 tablet Oral BID   sodium chloride  flush  10-40 mL Intracatheter Q12H   sodium chloride  flush  3 mL Intravenous Q12H   [START ON 02/21/2024] spironolactone   25 mg Oral Daily   torsemide   20 mg Oral Daily   Continuous Infusions: PRN  Meds:.acetaminophen  **OR** acetaminophen , diphenhydrAMINE , morphine  injection, prochlorperazine , sodium chloride  flush, traMADol     Subjective:  [name] was seen and examined today.  She reports being constipated. Requesting laxatives.   Objective:   Vitals:   02/19/24 2005 02/20/24 0302 02/20/24 0918 02/20/24 1233  BP: 108/62 94/65 136/68 105/61  Pulse: 96 99 99 97  Resp: 20 20 16 16   Temp: 98.1 F (36.7 C) 99 F (37.2 C)  98.1 F (36.7 C)  TempSrc: Oral Oral  Oral  SpO2: 99% 98% 99% 100%  Weight:      Height:        Intake/Output Summary (Last 24 hours) at 02/20/2024 1618 Last data filed at 02/20/2024 1542 Gross per 24 hour  Intake 720 ml  Output 3700 ml  Net -2980 ml   Filed Weights   02/17/24 0610 02/18/24 0500 02/19/24 0404  Weight: (!) 170.4 kg (!) 163.7 kg (!) 158.3 kg     Exam General exam: Appears calm and comfortable  Respiratory system: Clear to auscultation. Respiratory effort normal. Cardiovascular system: S1 & S2 heard, RRR. No JVD, Gastrointestinal system: Abdomen is nondistended, soft and nontender.  Central nervous system: Alert and oriented.  Extremities: no cyanosis.  Skin: decubitus wound on the buttocks.  Psychiatry: Mood & affect appropriate.     Data Reviewed:  I have personally reviewed following labs and imaging studies   CBC Lab Results  Component Value Date   WBC 13.6 (H) 02/20/2024   RBC 3.60 (L) 02/20/2024   HGB 10.2 (L) 02/20/2024   HCT 32.6 (L) 02/20/2024   MCV 90.6 02/20/2024   MCH 28.3 02/20/2024   PLT 249 02/20/2024   MCHC 31.3 02/20/2024   RDW 14.6 02/20/2024   LYMPHSABS 2.7 02/20/2024   MONOABS 1.9 (H) 02/20/2024   EOSABS 0.6 (H) 02/20/2024   BASOSABS 0.1 02/20/2024     Last metabolic panel Lab Results  Component Value Date   NA 136 02/20/2024   K 3.2 (L) 02/20/2024   CL 96 (L) 02/20/2024   CO2 29 02/20/2024   BUN 12 02/20/2024   CREATININE 0.95 02/20/2024   GLUCOSE 254 (H) 02/20/2024   GFRNONAA  >60 02/20/2024   GFRAA >60 02/07/2020   CALCIUM  8.6 (L) 02/20/2024   PHOS 4.6 11/17/2022   PROT 5.2 (L) 02/16/2024   ALBUMIN  2.4 (L) 02/16/2024   LABGLOB 2.5 12/30/2021   BILITOT 1.3 (H) 02/16/2024   ALKPHOS 53 02/16/2024   AST 18 02/16/2024   ALT 13 02/16/2024   ANIONGAP 11 02/20/2024    CBG (last 3)  Recent Labs    02/19/24 2002 02/20/24 0721 02/20/24 1155  GLUCAP 234* 269* 303*      Coagulation Profile: Recent Labs  Lab 02/15/24 0234  INR 1.3*     Radiology Studies: No results found.     Elgie Butter M.D. [facility] 02/20/2024, 4:18 PM  Available via Epic secure chat 7am-7pm After 7 pm, please refer to night coverage provider listed on amion.

## 2024-02-20 NOTE — Progress Notes (Addendum)
 Progress Note  Patient Name: [name] Date of Encounter: 02/20/2024 [facility] Cardiologist: Gordy Bergamo, MD   Interval Summary   Complains of pain with wound care to buttock wounds and nausea this morning Has not had any additional episodes of vomiting  Finding it difficult to eat food with nausea  Vital Signs Vitals:   02/19/24 0500 02/19/24 1207 02/19/24 2005 02/20/24 0302  BP:  130/67 108/62 94/65  Pulse:  95 96 99  Resp: 17  20 20   Temp:  99.4 F (37.4 C) 98.1 F (36.7 C) 99 F (37.2 C)  TempSrc:  Oral Oral Oral  SpO2:  96% 99% 98%  Weight:      Height:       Intake/Output Summary (Last 24 hours) at 02/20/2024 0906 Last data filed at 02/19/2024 2130 Gross per 24 hour  Intake 1343 ml  Output 2600 ml  Net -1257 ml      02/19/2024    4:04 AM 02/18/2024    5:00 AM 02/17/2024    6:10 AM  Last 3 Weights  Weight (lbs) 348 lb 15.8 oz 361 lb 375 lb 10.6 oz  Weight (kg) 158.3 kg 163.749 kg 170.4 kg     Telemetry/ECG  Sinus rhythm, HR 90s, PVCs - Personally Reviewed  Physical Exam  GEN: No acute distress.   Neck: No JVD Cardiac: RRR, no murmurs, rubs, or gallops.  Respiratory: Clear to auscultation bilaterally. GI: Soft, non-distended  MS: mild LE edema  Assessment & Plan  Acute HFrEF  Hypertension  EF previously as low as 30-35% LHC in 2017 showed normal coronaries Treated as nonischemic cardiomyopathy with GDMT Echo 09/2022 showed EF 55-60%, normal RV function, mild LVH, normal PASP Echo this admission: EF 35-40%, wall motion abnormalities consistent with Takotsubo cardiomyopathy vs LAD infarct  Patient denies any active or prior chest pain EKG without ischemic changes Cath not indicated at this time GDMT initially held due to hypotension, AKI but has been added back  Most recent BP 94/65 Net - 12 L this admission  Renal function stable this morning  Continue Entresto  49-51 mg BID Continue Toprol  25 mg daily Increase spironolactone  25 mg  daily Continue PO torsemide  20 mg daily  No SGLT2i until wounds heal   Elevated troponin level  Denies chest pain, shortness of breath, orthopnea Denies any exertional symptoms -- although fairly sedentary Troponin 857 > 1,370 > 1,210 LHC 2017 showed normal coronaries  Given acute illness and inability to keep down medications due to gastroparesis and resolving acute on chronic renal failure, not ideal time to pursue cardiac cath Recommend medical management of CHF and possible CAD If EF remains reduced or she develops symptoms can consider cath as outpatient  Not on ASA due to Eliquis  use for history of DVT  Per primary History of DVT Acute encephalopathy SIRS Insulin  dependent type 2 diabetes with gastroparesis  Chronic anemia Hypothyroidism Chronic back pain SLE Sjgren's    For questions or updates, please contact [facility] Please consult [email or web address] for contact info under       Signed, Waddell DELENA Donath, PA-C    Patient seen and examined with TP PA-C.  Agree as above, with the following exceptions and changes as noted below.  She appears brighter today but does not feel any better.  She does feel that her volume overload is improving. Gen: NAD, CV: RRR, no murmurs, Lungs: clear, Abd: soft, Extrem: Warm, well perfused, 1-2+ left lower extremity edema, 1+ right lower  extremity edema, Neuro/Psych: alert and oriented x 3, normal mood and affect. All available labs, radiology testing, previous records reviewed.  Patient continues to suffer with wounds and today has some nausea.  Initially on presentation had difficulty with vomiting and gastroparesis, therefore cardiac catheterization not pursued given not optimal timing.  She does appear slightly better today and we will titrate GDMT with an increase in her spironolactone  to 25 mg daily starting tomorrow.  She continues to diurese appropriately on oral torsemide  with stable renal function, potassium repletion ordered  by primary service.  She will eventually need an ischemic workup.  Given patient body habitus and discomfort with wounds, it may be challenging to complete a cardiac MRI which would be somewhat helpful in determining etiology of troponin elevation given that her coronary arteries were normal in 2017.  However cardiac catheterization is the first next step when patient is able to have this completed.  If she remains in hospital, can consider toward the end of her hospital stay.  For now we will continue medical therapy.  Would plan to perform a limited repeat echo prior to hospital discharge to see if any interval change from a possible stress cardiomyopathy has occurred which would lessen the need for ischemic workup.  Soyla DELENA Merck, MD 02/20/24 4:09 PM

## 2024-02-21 DIAGNOSIS — G9340 Encephalopathy, unspecified: Secondary | ICD-10-CM | POA: Diagnosis not present

## 2024-02-21 DIAGNOSIS — N179 Acute kidney failure, unspecified: Secondary | ICD-10-CM | POA: Diagnosis not present

## 2024-02-21 DIAGNOSIS — I5021 Acute systolic (congestive) heart failure: Secondary | ICD-10-CM | POA: Diagnosis not present

## 2024-02-21 DIAGNOSIS — Z86718 Personal history of other venous thrombosis and embolism: Secondary | ICD-10-CM | POA: Diagnosis not present

## 2024-02-21 DIAGNOSIS — R7989 Other specified abnormal findings of blood chemistry: Secondary | ICD-10-CM | POA: Diagnosis not present

## 2024-02-21 LAB — GLUCOSE, CAPILLARY
Glucose-Capillary: 249 mg/dL — ABNORMAL HIGH (ref 70–99)
Glucose-Capillary: 231 mg/dL — ABNORMAL HIGH (ref 70–99)
Glucose-Capillary: 295 mg/dL — ABNORMAL HIGH (ref 70–99)
Glucose-Capillary: 137 mg/dL — ABNORMAL HIGH (ref 70–99)

## 2024-02-21 LAB — BASIC METABOLIC PANEL WITH GFR
Anion gap: 11 (ref 5–15)
BUN: 15 mg/dL (ref 6–20)
CO2: 27 mmol/L (ref 22–32)
Calcium: 8.7 mg/dL — ABNORMAL LOW (ref 8.9–10.3)
Chloride: 99 mmol/L (ref 98–111)
Creatinine, Ser: 1.03 mg/dL — ABNORMAL HIGH (ref 0.44–1.00)
GFR, Estimated: >60 mL/min (ref >60)
Glucose, Bld: 235 mg/dL — ABNORMAL HIGH (ref 70–99)
Potassium: 3.8 mmol/L (ref 3.5–5.1)
Sodium: 137 mmol/L (ref 135–145)

## 2024-02-21 LAB — CBC WITH DIFFERENTIAL/PLATELET
Abs Immature Granulocytes: 0.1 K/uL — ABNORMAL HIGH (ref 0.00–0.07)
Basophils Absolute: 0.1 K/uL (ref 0.0–0.1)
Basophils Relative: 1 %
Eosinophils Absolute: 0.6 K/uL — ABNORMAL HIGH (ref 0.0–0.5)
Eosinophils Relative: 4 %
HCT: 33.5 % — ABNORMAL LOW (ref 36.0–46.0)
Hemoglobin: 10.4 g/dL — ABNORMAL LOW (ref 12.0–15.0)
Immature Granulocytes: 1 %
Lymphocytes Relative: 20 %
Lymphs Abs: 2.6 K/uL (ref 0.7–4.0)
MCH: 29.1 pg (ref 26.0–34.0)
MCHC: 31 g/dL (ref 30.0–36.0)
MCV: 93.6 fL (ref 80.0–100.0)
Monocytes Absolute: 1.9 K/uL — ABNORMAL HIGH (ref 0.1–1.0)
Monocytes Relative: 14 %
Neutro Abs: 7.9 K/uL — ABNORMAL HIGH (ref 1.7–7.7)
Neutrophils Relative %: 60 %
Platelets: 236 K/uL (ref 150–400)
RBC: 3.58 MIL/uL — ABNORMAL LOW (ref 3.87–5.11)
RDW: 15 % (ref 11.5–15.5)
WBC: 13.1 K/uL — ABNORMAL HIGH (ref 4.0–10.5)
nRBC: 0 % (ref 0.0–0.2)

## 2024-02-21 LAB — MAGNESIUM: Magnesium: 2.3 mg/dL (ref 1.7–2.4)

## 2024-02-21 MED ORDER — INSULIN ASPART 100 UNIT/ML IJ SOLN
5.0000 [IU] | Freq: Three times a day (TID) | INTRAMUSCULAR | Status: DC
  Administered 2024-02-23 – 2024-02-29 (×14): 5 [IU] via SUBCUTANEOUS

## 2024-02-21 MED ORDER — INSULIN GLARGINE-YFGN 100 UNIT/ML ~~LOC~~ SOLN
30.0000 [IU] | Freq: Two times a day (BID) | SUBCUTANEOUS | Status: DC
  Administered 2024-02-21 – 2024-02-29 (×16): 30 [IU] via SUBCUTANEOUS
  Filled 2024-02-21 (×17): qty 0.3

## 2024-02-21 MED ORDER — ORAL CARE MOUTH RINSE: 15.0000 mL | OROMUCOSAL | Status: DC | PRN

## 2024-02-21 NOTE — Progress Notes (Signed)
 [facility]                                                                               [name], is a 53 y.o. female, DOB - 03-25-1971, FMW:[id number] Admit date - 02/14/2024    Outpatient Primary MD for the patient is [name] Madelin Patch, MD  LOS - 7  days    Brief summary    53 y.o. female with medical history significant for chronic HFpEF, history of VTE on Eliquis , insulin -dependent T2DM, SLE, Sjogren's syndrome, HTN, gastroparesis, hypothyroidism, anemia, chronic back pain, morbid obesity who presented to the ED for evaluation of altered mental status  Patient recently admitted at [facility] from 6/30-7/8 for intractable nausea and vomiting related to gastroparesis. She had associated AKI. She was managed with antiemetics with improvement in nausea vomiting. She was also complaining of acute on chronic back pain.    Assessment & Plan    Assessment and Plan:  Acute encephalopathy Resolved.  Suspect from polypharmacy.  CT head negative.    New onset systolic CHF/ Acute HRrEF/ elevated troponins Cardiology on board.  ON torsemide , entresto , metoprolol  succinate  and spironolactone .  Echo this admission shows Repeat echocardiogram done on 02/15/2024 showed EF of 35 to 40%    Hypokalemia Replaced. Check magnesium  levels.   SIRS From Pressure injury to left upper inner buttocks unstageable  She was initially started on broad spectrum IV antibiotics and narrowed to oral doxycycline .  Afebrile, but with persistent leukocytosis.  She also received 3 days of IV cefepime  for e coli in the urine.  Wound care consult for buttocks wound.    AKI - creatinine on admission is 2, improved with diuresis.  US  rena is negative for hydronephrosis.  Creatinine around 1 and stable.    H/o DVT On eliquis .   Body mass index is 51.54 kg/m. Obesity class III   Insulin  dependent type 2 DM, uncontrolled.  CBG (last 3)  Recent Labs    02/20/24 1155  02/20/24 2005 02/21/24 0734  GLUCAP 303* 275* 249*   Increase semglee  to 30 units bid. Added novolog  5 units tidac.  Change to resistant SSI.  Get  A1c 7.3    Hypothyroidism Resume synthroid .    Chronic normocytic anemia Hemoglobin stable.    SLE/ Sjogren's syndrome  Nausea , no vomiting. Suspect from gastroparesis.  Will start her on erythromycin  to see if that will help.  Unfortunately she cannot tolerate reglan .  She reports her nausea is better , monitor.   Vaginal bleeding Unclear if menstrual bleeding? US  PEVIS transabdominal ultrasound did not show any acute findings. Bilateral ovaries not discretely visualized. Tranvaginal ultrasound couldn't be done due to discomfort.   Estimated body mass index is 51.54 kg/m as calculated from the following:   Height as of this encounter: 5' 9 (1.753 m).   Weight as of this encounter: 158.3 kg.  Code Status: full code.  DVT Prophylaxis:  apixaban  (ELIQUIS ) tablet 2.5 mg Start: 02/16/24 1700 apixaban  (ELIQUIS ) tablet 2.5 mg   Level of Care: Level of care: Progressive Family Communication: none at bedside.   Disposition Plan:     Remains inpatient appropriate:  pending  clinical improvement.   Procedures:  None.   Consultants:   Cardiology.   Antimicrobials:   Anti-infectives (From admission, onward)    Start     Dose/Rate Route Frequency Ordered Stop   02/20/24 1715  erythromycin  (E-MYCIN ) tablet 250 mg        250 mg Oral 3 times daily with meals & bedtime 02/20/24 1625     02/19/24 1330  doxycycline  (VIBRA -TABS) tablet 100 mg        100 mg Oral Every 12 hours 02/19/24 1232     02/18/24 1000  vancomycin  (VANCOREADY) IVPB 1250 mg/250 mL  Status:  Discontinued        1,250 mg 166.7 mL/hr over 90 Minutes Intravenous Every 12 hours 02/18/24 0849 02/18/24 1320   02/17/24 2000  vancomycin  (VANCOREADY) IVPB 1500 mg/300 mL  Status:  Discontinued        1,500 mg 150 mL/hr over 120 Minutes Intravenous Every 12 hours  02/17/24 1101 02/18/24 0849   02/16/24 2000  vancomycin  (VANCOREADY) IVPB 1250 mg/250 mL  Status:  Discontinued        1,250 mg 166.7 mL/hr over 90 Minutes Intravenous Every 12 hours 02/16/24 0932 02/17/24 1101   02/15/24 2200  vancomycin  (VANCOCIN ) IVPB 1000 mg/200 mL premix  Status:  Discontinued        1,000 mg 200 mL/hr over 60 Minutes Intravenous Every 24 hours 02/14/24 2345 02/15/24 0958   02/15/24 2200  vancomycin  (VANCOREADY) IVPB 1750 mg/350 mL  Status:  Discontinued        1,750 mg 175 mL/hr over 120 Minutes Intravenous Every 24 hours 02/15/24 0958 02/16/24 0932   02/15/24 1000  ceFEPIme  (MAXIPIME ) 2 g in sodium chloride  0.9 % 100 mL IVPB  Status:  Discontinued        2 g 200 mL/hr over 30 Minutes Intravenous Every 8 hours 02/15/24 0958 02/19/24 1232   02/15/24 0900  ceFEPIme  (MAXIPIME ) 2 g in sodium chloride  0.9 % 100 mL IVPB  Status:  Discontinued        2 g 200 mL/hr over 30 Minutes Intravenous Every 12 hours 02/14/24 2345 02/15/24 0958   02/14/24 2345  metroNIDAZOLE  (FLAGYL ) IVPB 500 mg  Status:  Discontinued        500 mg 100 mL/hr over 60 Minutes Intravenous 2 times daily 02/14/24 2334 02/19/24 1232   02/14/24 2115  vancomycin  (VANCOREADY) IVPB 2000 mg/400 mL        2,000 mg 200 mL/hr over 120 Minutes Intravenous  Once 02/14/24 2110 02/15/24 0238   02/14/24 2115  ceFEPIme  (MAXIPIME ) 2 g in sodium chloride  0.9 % 100 mL IVPB        2 g 200 mL/hr over 30 Minutes Intravenous  Once 02/14/24 2110 02/14/24 2235        Medications  Scheduled Meds:  apixaban   2.5 mg Oral BID   bisacodyl   10 mg Rectal Once   Chlorhexidine  Gluconate Cloth  6 each Topical Daily   collagenase    Topical Daily   doxycycline   100 mg Oral Q12H   erythromycin   250 mg Oral TID WC & HS   feeding supplement  237 mL Oral BID BM   insulin  aspart  0-20 Units Subcutaneous TID WC   insulin  aspart  0-5 Units Subcutaneous QHS   insulin  glargine-yfgn  20 Units Subcutaneous BID   levothyroxine   50 mcg  Oral Q0600   metoprolol  succinate  25 mg Oral Daily   naLOXone  (NARCAN )  injection  0.4 mg Intravenous  Once   polyethylene glycol  17 g Oral Daily   sacubitril -valsartan   1 tablet Oral BID   senna-docusate  2 tablet Oral BID   sodium chloride  flush  10-40 mL Intracatheter Q12H   sodium chloride  flush  3 mL Intravenous Q12H   spironolactone   25 mg Oral Daily   torsemide   20 mg Oral Daily   Continuous Infusions: PRN Meds:.acetaminophen  **OR** acetaminophen , diphenhydrAMINE , morphine  injection, mouth rinse, prochlorperazine , sodium chloride  flush, traMADol     Subjective:   [name] was seen and examined today.  Nausea has improved. No abdominal pain.   Objective:   Vitals:   02/20/24 0918 02/20/24 1233 02/20/24 2008 02/21/24 0240  BP: 136/68 105/61 104/63 114/67  Pulse: 99 97 (!) 103 95  Resp: 16 16 16 20   Temp:  98.1 F (36.7 C) 99.2 F (37.3 C) 97.6 F (36.4 C)  TempSrc:  Oral Oral Oral  SpO2: 99% 100% 94% 98%  Weight:      Height:        Intake/Output Summary (Last 24 hours) at 02/21/2024 1200 Last data filed at 02/21/2024 1015 Gross per 24 hour  Intake 490 ml  Output 2550 ml  Net -2060 ml   Filed Weights   02/18/24 0500 02/19/24 0404  Weight: (!) 163.7 kg (!) 158.3 kg     Exam General exam: Appears calm and comfortable  Respiratory system: Clear to auscultation. Respiratory effort normal. Cardiovascular system: S1 & S2 heard, RRR. No JVD Gastrointestinal system: Abdomen is soft , nd bs+ Central nervous system: Alert and oriented.  Extremities: no cyanosis.  Skin: No rashes Psychiatry: Mood & affect appropriate.      Data Reviewed:  I have personally reviewed following labs and imaging studies   CBC Lab Results  Component Value Date   WBC 13.1 (H) 02/21/2024   RBC 3.58 (L) 02/21/2024   HGB 10.4 (L) 02/21/2024   HCT 33.5 (L) 02/21/2024   MCV 93.6 02/21/2024   MCH 29.1 02/21/2024   PLT 236 02/21/2024   MCHC 31.0 02/21/2024   RDW 15.0  02/21/2024   LYMPHSABS 2.6 02/21/2024   MONOABS 1.9 (H) 02/21/2024   EOSABS 0.6 (H) 02/21/2024   BASOSABS 0.1 02/21/2024     Last metabolic panel Lab Results  Component Value Date   NA 137 02/21/2024   K 3.8 02/21/2024   CL 99 02/21/2024   CO2 27 02/21/2024   BUN 15 02/21/2024   CREATININE 1.03 (H) 02/21/2024   GLUCOSE 235 (H) 02/21/2024   GFRNONAA >60 02/21/2024   GFRAA >60 02/07/2020   CALCIUM  8.7 (L) 02/21/2024   PHOS 4.6 11/17/2022   PROT 5.2 (L) 02/16/2024   ALBUMIN  2.4 (L) 02/16/2024   LABGLOB 2.5 12/30/2021   BILITOT 1.3 (H) 02/16/2024   ALKPHOS 53 02/16/2024   AST 18 02/16/2024   ALT 13 02/16/2024   ANIONGAP 11 02/21/2024    CBG (last 3)  Recent Labs    02/20/24 1155 02/20/24 2005 02/21/24 0734  GLUCAP 303* 275* 249*      Coagulation Profile: Recent Labs  Lab 02/15/24 0234  INR 1.3*     Radiology Studies: US  PELVIS (TRANSABDOMINAL ONLY) Result Date: 02/21/2024 EXAM: PELVIC ULTRASOUND TECHNIQUE: Transabdominal pelvic duplex ultrasound using B-mode/gray scaled imaging without Doppler spectral analysis and color flow was obtained. COMPARISON: CT abdomen/pelvis dated 01/25/2024 CLINICAL HISTORY: Vaginal bleeding FINDINGS: ULTRASOUND FINDINGS: UTERUS: Uterus measures 6.8 x 3.5 x 6.3 cm (calculated volume 78 ml). ENDOMETRIAL STRIPE: Endometrial complex measures 5 mm. RIGHT OVARY:  Right ovary is not discretely visualized. LEFT OVARY: Left ovary is not discretely visualized. FREE FLUID: No free fluid. LIMITATIONS/ARTIFACTS: Transvaginal ultrasound could not be performed due to patient discomfort. IMPRESSION: 1. No acute findings. 2. Bilateral ovaries not discretely visualized. Electronically signed by: Pinkie Pebbles MD 02/21/2024 03:49 AM EDT RP Workstation: [id number]       Elgie Butter M.D. [facility] 02/21/2024, 12:00 PM  Available via Epic secure chat 7am-7pm After 7 pm, please refer to night coverage provider listed on amion.

## 2024-02-21 NOTE — Progress Notes (Signed)
   02/21/24 1400  Assess: MEWS Score  Temp 99.4 F (37.4 C)  BP (!) 94/53  MAP (mmHg) (!) 64  Pulse Rate (!) 101  Level of Consciousness Alert  SpO2 99 %  O2 Device Room Air  Patient Activity (if Appropriate) In bed  Assess: MEWS Score  MEWS Temp 0  MEWS Systolic 1  MEWS Pulse 1  MEWS RR 0  MEWS LOC 0  MEWS Score 2  MEWS Score Color Yellow  Assess: if the MEWS score is Yellow or Red  Were vital signs accurate and taken at a resting state? No, vital signs rechecked  Does the patient meet 2 or more of the SIRS criteria? Yes  Does the patient have a confirmed or suspected source of infection? Yes  Assess: SIRS CRITERIA  SIRS Temperature  0  SIRS Respirations  0  SIRS Pulse 1  SIRS WBC 1  SIRS Score Sum  2

## 2024-02-21 NOTE — TOC Progression Note (Signed)
 Transition of Care [facility]) - Progression Note    Patient Details  Name: [name] MRN: [id number] Date of Birth: [birth year]  Transition of Care [facility]) CM/SW Contact  Jamaiya Tunnell, Nathanel, RN Phone Number: 02/21/2024, 12:47 PM  Clinical Narrative: Patient adamantly decline St SNF-prefers home-states she has private duty care-5hrs/dy x7 days week;No HHC agency to accept-No-[facility], [facility]-patient will have family to asst w/dsg changes;I will set up wound care appt. Has PTAR.     Expected Discharge Plan: Home/Self Care Barriers to Discharge: Continued Medical Work up               Expected Discharge Plan and Services   Discharge Planning Services: CM Consult Post Acute Care Choice: Resumption of Svcs/PTA Provider Living arrangements for the past 2 months: Single Family Home, Apartment                                       Social Drivers of Health (SDOH) Interventions SDOH Screenings   Food Insecurity: No Food Insecurity (02/18/2024)  Recent Concern: Food Insecurity - Medium Risk (02/07/2024)   Received from [facility]  Housing: Low Risk  (02/18/2024)  Recent Concern: Housing - High Risk (02/15/2024)  Transportation Needs: No Transportation Needs (02/18/2024)  Utilities: At Risk (02/18/2024)  Financial Resource Strain: Low Risk  (03/30/2022)   Received from [facility], [facility] visits prior to 10/01/2022.  Social Connections: Unknown (12/11/2022)   Received from [facility]  Stress: No Stress Concern Present (01/30/2024)   Received from [facility]  Tobacco Use: Medium Risk (02/20/2024)    Readmission Risk Interventions    02/18/2024    4:43 PM 04/27/2023    3:50 PM  Readmission Risk Prevention Plan  Transportation Screening Complete Complete  PCP or Specialist Appt within 3-5 Days  Complete  HRI or Home Care Consult  Complete  Social Work Consult for Recovery Care Planning/Counseling   Complete  Palliative Care Screening  Not Applicable  Medication Review Ocean[facility]) Complete Referral to Pharmacy  PCP or Specialist appointment within 3-5 days of discharge Complete   HRI or Home Care Consult Complete   SW Recovery Care/Counseling Consult Complete   Palliative Care Screening Not Applicable   Skilled Nursing Facility Not Applicable

## 2024-02-21 NOTE — Progress Notes (Signed)
 Physical Therapy Treatment Patient Details Name: [name] MRN: [id number] DOB: 1970-08-18 Today's Date: 02/21/2024   History of Present Illness Pt is 53 yo female admitted on 02/14/24 with acute encephalopathy (likely in setting of polypharmacy), SIRS, buttock wounds, AKI, and CHF.  Pt with hx including but not limited to chronic HFpEF, history of VTE on Eliquis , insulin -dependent T2DM, SLE, Sjogren's syndrome, HTN, gastroparesis, hypothyroidism, anemia, chronic back pain, morbid obesity    PT Comments  Pt was able to progress to OOB activity today, but needs assistance of 2 and fatigued very easily.  She required mod x 2 to stand and min A x 2 to pivot to chair.  She was not able to take any further steps.  Pt normally ambulates short household distances and has aide 7 x week but only 4 hr mid-day.   Pt has adamantly declined SNF to [facility] - urged her to re-consider due to decreased mobility from baseline, fall risk, and does not have 24 hr support.     If plan is discharge home, recommend the following: A lot of help with walking and/or transfers;A lot of help with bathing/dressing/bathroom   Can travel by private vehicle     No  Equipment Recommendations  None recommended by PT    Recommendations for Other Services       Precautions / Restrictions Precautions Precautions: Fall     Mobility  Bed Mobility Overal bed mobility: Needs Assistance Bed Mobility: Supine to Sit     Supine to sit: Mod assist, +2 for physical assistance     General bed mobility comments: increased time, use of rails, on pressure relief mattress    Transfers Overall transfer level: Needs assistance Equipment used: Rolling walker (2 wheels) Transfers: Sit to/from Stand, Bed to chair/wheelchair/BSC Sit to Stand: Mod assist, +2 physical assistance   Step pivot transfers: Min assist, +2 physical assistance       General transfer comment: Pt stood from elevated bed and from recliner (pillow to  elevate).  Cues for hand placement.  MOd A x 2 to rise with increased time.  Once standing min A to take small steps to chair but fatigued easily and expressed fear of falling.    Ambulation/Gait Ambulation/Gait assistance: Min assist, +2 physical assistance Gait Distance (Feet): 2 Feet Assistive device: Rolling walker (2 wheels) Gait Pattern/deviations: Step-to pattern, Decreased stride length, Shuffle Gait velocity: decreased     General Gait Details: small steps to recliner (see above); stood again with plans to ambulate forward with chair follow but pt reports that she needs to sit b/c legs weak   Stairs             Wheelchair Mobility     Tilt Bed    Modified Rankin (Stroke Patients Only)       Balance Overall balance assessment: Needs assistance Sitting-balance support: No upper extremity supported Sitting balance-Leahy Scale: Fair Sitting balance - Comments: Can sit EOB with supervision   Standing balance support: Reliant on assistive device for balance, No upper extremity supported Standing balance-Leahy Scale: Poor Standing balance comment: RW and min A; fatigued easily                            Communication    Cognition Arousal: Alert Behavior During Therapy: Flat affect   PT - Cognitive impairments: Safety/Judgement  PT - Cognition Comments: decreased insight to deficits/ability to return home        Cueing    Exercises General Exercises - Lower Extremity Ankle Circles/Pumps: AROM, Both, 10 reps, Supine Quad Sets: AROM, Both, 10 reps, Supine Long Arc Quad: AROM, Both, Seated, 5 reps    General Comments        Pertinent Vitals/Pain Pain Assessment Pain Assessment: Faces Faces Pain Scale: Hurts even more Pain Location: wounds and low back Pain Descriptors / Indicators: Grimacing Pain Intervention(s): Limited activity within patient's tolerance, Monitored during session, Repositioned, RN gave  pain meds during session    Home Living                          Prior Function            PT Goals (current goals can now be found in the care plan section) Progress towards PT goals: Progressing toward goals    Frequency    Min 2X/week      PT Plan      Co-evaluation              AM-PAC PT 6 Clicks Mobility   Outcome Measure  Help needed turning from your back to your side while in a flat bed without using bedrails?: A Lot Help needed moving from lying on your back to sitting on the side of a flat bed without using bedrails?: Total Help needed moving to and from a bed to a chair (including a wheelchair)?: Total Help needed standing up from a chair using your arms (e.g., wheelchair or bedside chair)?: Total Help needed to walk in hospital room?: Total Help needed climbing 3-5 steps with a railing? : Total 6 Click Score: 7    End of Session Equipment Utilized During Treatment: Gait belt Activity Tolerance: Patient limited by fatigue Patient left: with call bell/phone within reach;with chair alarm set;in chair (pillows under buttock to lift and soften chair) Nurse Communication: Mobility status PT Visit Diagnosis: Other abnormalities of gait and mobility (R26.89);Muscle weakness (generalized) (M62.81)     Time: 8577-8548 PT Time Calculation (min) (ACUTE ONLY): 29 min  Charges:    $Therapeutic Exercise: 8-22 mins $Therapeutic Activity: 8-22 mins PT General Charges $$ ACUTE PT VISIT: 1 Visit                     [name], PT Acute Rehab [facility] [phone]    [name] 02/21/2024, 3:21 PM

## 2024-02-21 NOTE — Plan of Care (Signed)

## 2024-02-21 NOTE — Progress Notes (Addendum)
 Progress Note  Patient Name: [name] Date of Encounter: 02/21/2024 [facility] Cardiologist: Gordy Bergamo, MD   Interval Summary   Reports feeling slightly better today Still reports nausea and pain with her buttock wounds  Vital Signs Vitals:   02/20/24 0918 02/20/24 1233 02/20/24 2008 02/21/24 0240  BP: 136/68 105/61 104/63 114/67  Pulse: 99 97 (!) 103 95  Resp: 16 16 16 20   Temp:  98.1 F (36.7 C) 99.2 F (37.3 C) 97.6 F (36.4 C)  TempSrc:  Oral Oral Oral  SpO2: 99% 100% 94% 98%  Weight:      Height:        Intake/Output Summary (Last 24 hours) at 02/21/2024 9092 Last data filed at 02/21/2024 0820 Gross per 24 hour  Intake 370 ml  Output 2850 ml  Net -2480 ml      02/21/2024    5:00 AM 02/20/2024    7:01 AM 02/19/2024    4:04 AM  Last 3 Weights  Weight (lbs) -- -- 348 lb 15.8 oz  Weight (kg) -- -- 158.3 kg     Telemetry/ECG  Sinus rhythm, HR 100s - Personally Reviewed  Physical Exam  GEN: No acute distress.   Neck: No JVD Cardiac: RRR, no murmurs, rubs, or gallops.  Respiratory: Clear to auscultation bilaterally. GI: Soft, nontender, non-distended  MS: Mild LE edema  Assessment & Plan  Acute HFrEF  Hypertension  EF previously as low as 30-35% LHC in 2017 showed normal coronaries Treated as nonischemic cardiomyopathy with GDMT Echo 09/2022 showed EF 55-60%, normal RV function, mild LVH, normal PASP Echo this admission: EF 35-40%, wall motion abnormalities consistent with Takotsubo cardiomyopathy vs LAD infarct  Patient denies any active or prior chest pain EKG without ischemic changes GDMT initially held due to hypotension, AKI but has been added back  Most recent BP 114/67 Net - 15 L this admission  Creatinine mildly bumped morning  Continue Entresto  49-51 mg BID Continue Toprol  25 mg daily Continue spironolactone  25 mg daily Continue PO torsemide  20 mg daily  No SGLT2i until wounds heal  Discussed potential cardiac catheterization  towards the end of her hospital stay, patient is a bit hesitant and would like to discuss with her sister prior to making any final decisions --would have to hold Eliquis  prior to cath Plan for repeat limited echocardiogram prior to discharge, especially if patient defers catheterization during this admission   Elevated troponin level  Denies chest pain, shortness of breath, orthopnea Denies any exertional symptoms -- although fairly sedentary Troponin 857 > 1,370 > 1,210 LHC 2017 showed normal coronaries  Recommend medical management of CHF and possible CAD Discussed possible cardiac catheterization with patient, as above Not on ASA due to Eliquis  use for history of DVT   Per primary History of DVT Acute encephalopathy SIRS Insulin  dependent type 2 diabetes with gastroparesis  Chronic anemia Hypothyroidism Chronic back pain SLE Sjgren's    For questions or updates, please contact [facility] Please consult [email or web address] for contact info under       Signed, Waddell DELENA Donath, PA-C   Patient seen and examined with TP PA-C.  Agree as above, with the following exceptions and changes as noted below. Pain from wounds continues. Gen: NAD, CV: RRR, no murmurs, Lungs: clear, Abd: soft, Extrem: Warm, well perfused, no edema, Neuro/Psych: alert and oriented x 3, normal mood and affect. All available labs, radiology testing, previous records reviewed.  We discussed cardiac cath, she would like to  think about it. With DM and gastroparesis, there may also be development of coronary artery disease since LHC in 2017. Reduced EF likely warrants ischemic workup. If she defers cath to outpatient or all together, would at least repeat an echocardiogram prior to dismissal to reevaluate EF. Limited echo.    [name], MD 02/21/24 4:01 PM

## 2024-02-22 DIAGNOSIS — R7989 Other specified abnormal findings of blood chemistry: Secondary | ICD-10-CM | POA: Diagnosis not present

## 2024-02-22 DIAGNOSIS — I5021 Acute systolic (congestive) heart failure: Secondary | ICD-10-CM | POA: Diagnosis not present

## 2024-02-22 DIAGNOSIS — N179 Acute kidney failure, unspecified: Secondary | ICD-10-CM | POA: Diagnosis not present

## 2024-02-22 DIAGNOSIS — Z86718 Personal history of other venous thrombosis and embolism: Secondary | ICD-10-CM | POA: Diagnosis not present

## 2024-02-22 DIAGNOSIS — G9340 Encephalopathy, unspecified: Secondary | ICD-10-CM | POA: Diagnosis not present

## 2024-02-22 LAB — GLUCOSE, CAPILLARY
Glucose-Capillary: 203 mg/dL — ABNORMAL HIGH (ref 70–99)
Glucose-Capillary: 264 mg/dL — ABNORMAL HIGH (ref 70–99)
Glucose-Capillary: 192 mg/dL — ABNORMAL HIGH (ref 70–99)
Glucose-Capillary: 170 mg/dL — ABNORMAL HIGH (ref 70–99)

## 2024-02-22 LAB — BASIC METABOLIC PANEL WITH GFR
Anion gap: 9 (ref 5–15)
BUN: 19 mg/dL (ref 6–20)
CO2: 28 mmol/L (ref 22–32)
Calcium: 8.6 mg/dL — ABNORMAL LOW (ref 8.9–10.3)
Chloride: 97 mmol/L — ABNORMAL LOW (ref 98–111)
Creatinine, Ser: 1.06 mg/dL — ABNORMAL HIGH (ref 0.44–1.00)
GFR, Estimated: >60 mL/min (ref >60)
Glucose, Bld: 216 mg/dL — ABNORMAL HIGH (ref 70–99)
Potassium: 3.3 mmol/L — ABNORMAL LOW (ref 3.5–5.1)
Sodium: 134 mmol/L — ABNORMAL LOW (ref 135–145)

## 2024-02-22 MED ORDER — SODIUM CHLORIDE 0.9 % IV SOLN
12.5000 mg | Freq: Once | INTRAVENOUS | Status: AC
  Administered 2024-02-22: 12.5 mg via INTRAVENOUS
  Filled 2024-02-22: qty 12.5

## 2024-02-22 MED ORDER — PANTOPRAZOLE SODIUM 40 MG IV SOLR
40.0000 mg | INTRAVENOUS | Status: DC
  Administered 2024-02-22 – 2024-02-23 (×2): 40 mg via INTRAVENOUS
  Filled 2024-02-22 (×2): qty 10

## 2024-02-22 NOTE — Progress Notes (Signed)
 PT Cancellation Note  Patient Details Name: [name] MRN: [id number] DOB: 16-Jan-1971   Cancelled Treatment:    Reason Eval/Treat Not Completed: Patient declined, no reason specified. Pt declines PT, reports nausea and tired. Maximum education and encouragement regarding mobility and time OOB without success. Will continue to check back as schedule permits.    Metta Ave PT, DPT 02/22/24, 12:45 PM

## 2024-02-22 NOTE — Progress Notes (Signed)
   02/22/24 1555  Provider Notification  Provider Name/Title Dr.Akula  Date Provider Notified 02/22/24  Time Provider Notified 1555  Method of Notification Page  Notification Reason Other (Comment) (pt refusing oral contrast for ct abdomen)  Provider response No new orders  Date of Provider Response 02/22/24  Time of Provider Response 1555

## 2024-02-22 NOTE — TOC Progression Note (Signed)
 Transition of Care [facility]) - Progression Note    Patient Details  Name: JADELYN ELKS MRN: [id number] Date of Birth: Mar 01, 1971  [facility] CM/SW Contact  Nicholson Starace, Nathanel, RN Phone Number: 02/22/2024, 2:39 PM  Clinical Narrative:Patient declines ST SNF-prefer home. Has private duty aide 5hrs/dy x 7days/week, has rw,3n1. Wound care clinic  to be set-left vm for call back to set up. PTAR @ d/c.       Expected Discharge Plan: Home/Self Care Barriers to Discharge: Continued Medical Work up               Expected Discharge Plan and Services   Discharge Planning Services: CM Consult Post Acute Care Choice: Resumption of Svcs/PTA Provider Living arrangements for the past 2 months: Single Family Home, Apartment                                       Social Drivers of Health (SDOH) Interventions SDOH Screenings   Food Insecurity: No Food Insecurity (02/18/2024)  Recent Concern: Food Insecurity - Medium Risk (02/07/2024)   Received from [facility]  Housing: Low Risk  (02/18/2024)  Recent Concern: Housing - High Risk (02/15/2024)  Transportation Needs: No Transportation Needs (02/18/2024)  Utilities: At Risk (02/18/2024)  Financial Resource Strain: Low Risk  (03/30/2022)   Received from [facility], [facility] visits prior to 10/01/2022.  Social Connections: Unknown (12/11/2022)   Received from [facility]  Stress: No Stress Concern Present (01/30/2024)   Received from [facility]  Tobacco Use: Medium Risk (02/20/2024)    Readmission Risk Interventions    02/18/2024    4:43 PM 04/27/2023    3:50 PM  Readmission Risk Prevention Plan  Transportation Screening Complete Complete  PCP or Specialist Appt within 3-5 Days  Complete  HRI or Home Care Consult  Complete  Social Work Consult for Recovery Care Planning/Counseling  Complete  Palliative Care Screening  Not Applicable  Medication Review Oceanographer) Complete Referral to  Pharmacy  PCP or Specialist appointment within 3-5 days of discharge Complete   HRI or Home Care Consult Complete   SW Recovery Care/Counseling Consult Complete   Palliative Care Screening Not Applicable   Skilled Nursing Facility Not Applicable

## 2024-02-22 NOTE — Progress Notes (Signed)
  Progress Note  Patient Name: [name] Date of Encounter: 02/22/2024 [facility] Cardiologist: Gordy Bergamo, MD   Interval Summary   Stable symptoms, no cp, no sob  Vital Signs Vitals:   02/21/24 1441 02/21/24 2221 02/22/24 0430 02/22/24 0752  BP: 108/89 (!) 106/58  110/62  Pulse: (!) 101 100  96  Resp: 14 20  18   Temp:  98.7 F (37.1 C)  98.5 F (36.9 C)  TempSrc:  Oral  Oral  SpO2: 100% 97%  99%  Weight:   (!) 158.3 kg   Height:        Intake/Output Summary (Last 24 hours) at 02/22/2024 0915 Last data filed at 02/22/2024 0902 Gross per 24 hour  Intake 360 ml  Output 200 ml  Net 160 ml      02/22/2024    4:30 AM 02/21/2024    5:00 AM 02/20/2024    7:01 AM  Last 3 Weights  Weight (lbs) 348 lb 15.8 oz -- --  Weight (kg) 158.3 kg -- --     Telemetry/ECG  Sinus rhythm, HR 90-100s - Personally Reviewed  Physical Exam  GEN: No acute distress.   Neck: No JVD Cardiac: RRR, no murmurs, rubs, or gallops.  Respiratory: Clear to auscultation bilaterally. GI: Soft, nontender, non-distended  MS: Mild LE edema  Assessment & Plan  Acute HFrEF  Hypertension  EF previously as low as 30-35% LHC in 2017 showed normal coronaries Treated as nonischemic cardiomyopathy with GDMT Echo 09/2022 showed EF 55-60%, normal RV function, mild LVH, normal PASP Echo this admission: EF 35-40%, wall motion abnormalities consistent with Takotsubo cardiomyopathy vs LAD infarct  Patient denies any active or prior chest pain EKG without ischemic changes GDMT initially held due to hypotension, AKI but has been added back  Most recent BP 114/67 Net - 15 L this admission  Creatinine mildly increased this morning  Continue Entresto  49-51 mg BID Continue Toprol  25 mg daily Continue spironolactone  25 mg daily Continue PO torsemide  20 mg daily, UOP is decreasing.  No SGLT2i until wounds heal  Discussed potential cardiac catheterization towards the end of her hospital stay, patient is a  bit hesitant and would like to discuss with her sister prior to making any final decisions she will discuss with her today, she is more inclined to proceed now--would have to hold Eliquis  prior to cath Plan for repeat limited echocardiogram prior to discharge, especially if patient defers catheterization during this admission   Elevated troponin level  Denies chest pain, shortness of breath, orthopnea Denies any exertional symptoms -- although fairly sedentary Troponin 857 > 1,370 > 1,210 LHC 2017 showed normal coronaries however with diabetes with complications including gastroparesis. A1c 7.3%  Discussed possible cardiac catheterization with patient, as above Not on ASA due to Eliquis  use for history of DVT   Per primary History of DVT Acute encephalopathy SIRS Insulin  dependent type 2 diabetes with gastroparesis  Chronic anemia Hypothyroidism Chronic back pain SLE Sjgren's    For questions or updates, please contact [facility] Please consult [email or web address] for contact info under       Signed, Hersey Maclellan A Amisha Pospisil, MD

## 2024-02-22 NOTE — Plan of Care (Signed)

## 2024-02-22 NOTE — Progress Notes (Signed)
 [facility]                                                                               [name], is a 53 y.o. female, DOB - [birth year], FMW:[id number] Admit date - 02/14/2024    Outpatient Primary MD for the patient is [name], MD  LOS - 8  days    Brief summary    53 y.o. female with medical history significant for chronic HFpEF, history of VTE on Eliquis , insulin -dependent T2DM, SLE, Sjogren's syndrome, HTN, gastroparesis, hypothyroidism, anemia, chronic back pain, morbid obesity who presented to the ED for evaluation of altered mental status  Patient recently admitted at [facility] from 6/30-7/8 for intractable nausea and vomiting related to gastroparesis. She had associated AKI. She was managed with antiemetics with improvement in nausea vomiting. She was also complaining of acute on chronic back pain.    Assessment & Plan    Assessment and Plan:  Acute encephalopathy Resolved.  Suspect from polypharmacy.  CT head negative.    New onset systolic CHF/ Acute HRrEF/ elevated troponins Cardiology on board.  ON torsemide , entresto , metoprolol  succinate  and spironolactone .  Echo this admission shows Repeat echocardiogram done on 02/15/2024 showed EF of 35 to 40%    Hypokalemia Replaced. Repeat in am.   SIRS From Pressure injury to left upper inner buttocks unstageable  She was initially started on broad spectrum IV antibiotics and narrowed to oral doxycycline .  Afebrile, but with persistent leukocytosis.  She also received 3 days of IV cefepime  for e coli in the urine.  Wound care consulted for buttocks wound.    AKI - creatinine on admission is 2, improved with diuresis.  US  rena is negative for hydronephrosis.  Creatinine around 1 and stable.    H/o DVT On eliquis .   Body mass index is 51.54 kg/m. Obesity class III   Insulin  dependent type 2 DM, uncontrolled.  CBG (last 3)  Recent Labs    02/21/24 2221  02/22/24 0737 02/22/24 1130  GLUCAP 137* 203* 264*   Increase semglee  to 30 units bid. Added novolog  5 units tidac.  Change to resistant SSI.  Get  A1c 7.3    Hypothyroidism Resume synthroid .    Chronic normocytic anemia Hemoglobin stable.    SLE/ Sjogren's syndrome  Nausea , no vomiting. Suspect from gastroparesis.  Will start her on erythromycin  to see if that will help.  Unfortunately she cannot tolerate reglan .  IV PPI added to the regimen.  Poor oral intake from nausea Ordered CT abd and pelvis for further evaluation.   Vaginal bleeding Unclear if menstrual bleeding? US  PEVIS transabdominal ultrasound did not show any acute findings. Bilateral ovaries not discretely visualized. Tranvaginal ultrasound couldn't be done due to discomfort.   Estimated body mass index is 51.54 kg/m as calculated from the following:   Height as of this encounter: 5' 9 (1.753 m).   Weight as of this encounter: 158.3 kg.  Code Status: full code.  DVT Prophylaxis:  apixaban  (ELIQUIS ) tablet 2.5 mg Start: 02/16/24 1700 apixaban  (ELIQUIS ) tablet 2.5 mg   Level of Care: Level of care: Progressive Family Communication: none at bedside.  Disposition Plan:     Remains inpatient appropriate:  pending clinical improvement.   Procedures:  None.   Consultants:   Cardiology.   Antimicrobials:   Anti-infectives (From admission, onward)    Start     Dose/Rate Route Frequency Ordered Stop   02/20/24 1715  erythromycin  (E-MYCIN ) tablet 250 mg        250 mg Oral 3 times daily with meals & bedtime 02/20/24 1625     02/19/24 1330  doxycycline  (VIBRA -TABS) tablet 100 mg        100 mg Oral Every 12 hours 02/19/24 1232     02/18/24 1000  vancomycin  (VANCOREADY) IVPB 1250 mg/250 mL  Status:  Discontinued        1,250 mg 166.7 mL/hr over 90 Minutes Intravenous Every 12 hours 02/18/24 0849 02/18/24 1320   02/17/24 2000  vancomycin  (VANCOREADY) IVPB 1500 mg/300 mL  Status:  Discontinued         1,500 mg 150 mL/hr over 120 Minutes Intravenous Every 12 hours 02/17/24 1101 02/18/24 0849   02/16/24 2000  vancomycin  (VANCOREADY) IVPB 1250 mg/250 mL  Status:  Discontinued        1,250 mg 166.7 mL/hr over 90 Minutes Intravenous Every 12 hours 02/16/24 0932 02/17/24 1101   02/15/24 2200  vancomycin  (VANCOCIN ) IVPB 1000 mg/200 mL premix  Status:  Discontinued        1,000 mg 200 mL/hr over 60 Minutes Intravenous Every 24 hours 02/14/24 2345 02/15/24 0958   02/15/24 2200  vancomycin  (VANCOREADY) IVPB 1750 mg/350 mL  Status:  Discontinued        1,750 mg 175 mL/hr over 120 Minutes Intravenous Every 24 hours 02/15/24 0958 02/16/24 0932   02/15/24 1000  ceFEPIme  (MAXIPIME ) 2 g in sodium chloride  0.9 % 100 mL IVPB  Status:  Discontinued        2 g 200 mL/hr over 30 Minutes Intravenous Every 8 hours 02/15/24 0958 02/19/24 1232   02/15/24 0900  ceFEPIme  (MAXIPIME ) 2 g in sodium chloride  0.9 % 100 mL IVPB  Status:  Discontinued        2 g 200 mL/hr over 30 Minutes Intravenous Every 12 hours 02/14/24 2345 02/15/24 0958   02/14/24 2345  metroNIDAZOLE  (FLAGYL ) IVPB 500 mg  Status:  Discontinued        500 mg 100 mL/hr over 60 Minutes Intravenous 2 times daily 02/14/24 2334 02/19/24 1232   02/14/24 2115  vancomycin  (VANCOREADY) IVPB 2000 mg/400 mL        2,000 mg 200 mL/hr over 120 Minutes Intravenous  Once 02/14/24 2110 02/15/24 0238   02/14/24 2115  ceFEPIme  (MAXIPIME ) 2 g in sodium chloride  0.9 % 100 mL IVPB        2 g 200 mL/hr over 30 Minutes Intravenous  Once 02/14/24 2110 02/14/24 2235        Medications  Scheduled Meds:  apixaban   2.5 mg Oral BID   Chlorhexidine  Gluconate Cloth  6 each Topical Daily   collagenase    Topical Daily   doxycycline   100 mg Oral Q12H   erythromycin   250 mg Oral TID WC & HS   feeding supplement  237 mL Oral BID BM   insulin  aspart  0-20 Units Subcutaneous TID WC   insulin  aspart  0-5 Units Subcutaneous QHS   insulin  aspart  5 Units Subcutaneous TID WC    insulin  glargine-yfgn  30 Units Subcutaneous BID   levothyroxine   50 mcg Oral Q0600   metoprolol  succinate  25  mg Oral Daily   polyethylene glycol  17 g Oral Daily   sacubitril -valsartan   1 tablet Oral BID   senna-docusate  2 tablet Oral BID   sodium chloride  flush  10-40 mL Intracatheter Q12H   sodium chloride  flush  3 mL Intravenous Q12H   spironolactone   25 mg Oral Daily   torsemide   20 mg Oral Daily   Continuous Infusions: PRN Meds:.acetaminophen  **OR** acetaminophen , diphenhydrAMINE , morphine  injection, mouth rinse, prochlorperazine , sodium chloride  flush, traMADol     Subjective:   [name] was seen and examined today. Persistent nausea. No abd pain.   Objective:   Vitals:   02/21/24 2221 02/22/24 0430 02/22/24 0752 02/22/24 1229  BP: (!) 106/58  110/62 111/60  Pulse: 100  96 92  Resp: 20  18 20   Temp: 98.7 F (37.1 C)  98.5 F (36.9 C) 99 F (37.2 C)  TempSrc: Oral  Oral Oral  SpO2: 97%  99% 95%  Weight:  (!) 158.3 kg    Height:        Intake/Output Summary (Last 24 hours) at 02/22/2024 1603 Last data filed at 02/22/2024 0902 Gross per 24 hour  Intake 240 ml  Output 200 ml  Net 40 ml   Filed Weights   02/19/24 0404 02/22/24 0430  Weight: (!) 158.3 kg (!) 158.3 kg     Exam  General exam: Appears calm and comfortable  Respiratory system: Clear to auscultation. Respiratory effort normal. Cardiovascular system: S1 & S2 heard, RRR. No JVD, murmurs, Gastrointestinal system: Abdomen is nondistended, soft and nontender.  Central nervous system: Alert and oriented.  Extremities: pedal edema.  Skin: No rashes,  Psychiatry:Mood & affect appropriate.       Data Reviewed:  I have personally reviewed following labs and imaging studies   CBC Lab Results  Component Value Date   WBC 13.1 (H) 02/21/2024   RBC 3.58 (L) 02/21/2024   HGB 10.4 (L) 02/21/2024   HCT 33.5 (L) 02/21/2024   MCV 93.6 02/21/2024   MCH 29.1 02/21/2024   PLT 236 02/21/2024    MCHC 31.0 02/21/2024   RDW 15.0 02/21/2024   LYMPHSABS 2.6 02/21/2024   MONOABS 1.9 (H) 02/21/2024   EOSABS 0.6 (H) 02/21/2024   BASOSABS 0.1 02/21/2024     Last metabolic panel Lab Results  Component Value Date   NA 134 (L) 02/22/2024   K 3.3 (L) 02/22/2024   CL 97 (L) 02/22/2024   CO2 28 02/22/2024   BUN 19 02/22/2024   CREATININE 1.06 (H) 02/22/2024   GLUCOSE 216 (H) 02/22/2024   GFRNONAA >60 02/22/2024   GFRAA >60 02/07/2020   CALCIUM  8.6 (L) 02/22/2024   PHOS 4.6 11/17/2022   PROT 5.2 (L) 02/16/2024   ALBUMIN  2.4 (L) 02/16/2024   LABGLOB 2.5 12/30/2021   BILITOT 1.3 (H) 02/16/2024   ALKPHOS 53 02/16/2024   AST 18 02/16/2024   ALT 13 02/16/2024   ANIONGAP 9 02/22/2024    CBG (last 3)  Recent Labs    02/21/24 2221 02/22/24 0737 02/22/24 1130  GLUCAP 137* 203* 264*      Coagulation Profile: No results for input(s): INR, PROTIME in the last 168 hours.    Radiology Studies: US  PELVIS (TRANSABDOMINAL ONLY) Result Date: 02/21/2024 EXAM: PELVIC ULTRASOUND TECHNIQUE: Transabdominal pelvic duplex ultrasound using B-mode/gray scaled imaging without Doppler spectral analysis and color flow was obtained. COMPARISON: CT abdomen/pelvis dated 01/25/2024 CLINICAL HISTORY: Vaginal bleeding FINDINGS: ULTRASOUND FINDINGS: UTERUS: Uterus measures 6.8 x 3.5 x 6.3 cm (calculated volume 78  ml). ENDOMETRIAL STRIPE: Endometrial complex measures 5 mm. RIGHT OVARY: Right ovary is not discretely visualized. LEFT OVARY: Left ovary is not discretely visualized. FREE FLUID: No free fluid. LIMITATIONS/ARTIFACTS: Transvaginal ultrasound could not be performed due to patient discomfort. IMPRESSION: 1. No acute findings. 2. Bilateral ovaries not discretely visualized. Electronically signed by: Pinkie Pebbles MD 02/21/2024 03:49 AM EDT RP Workstation: [id number]       Elgie Butter M.D. [facility] 02/22/2024, 4:03 PM  Available via Epic secure chat 7am-7pm After 7 pm, please  refer to night coverage provider listed on amion.

## 2024-02-23 ENCOUNTER — Inpatient Hospital Stay (HOSPITAL_COMMUNITY)

## 2024-02-23 DIAGNOSIS — N179 Acute kidney failure, unspecified: Secondary | ICD-10-CM | POA: Diagnosis not present

## 2024-02-23 DIAGNOSIS — I5021 Acute systolic (congestive) heart failure: Secondary | ICD-10-CM | POA: Diagnosis not present

## 2024-02-23 DIAGNOSIS — G9340 Encephalopathy, unspecified: Secondary | ICD-10-CM | POA: Diagnosis not present

## 2024-02-23 DIAGNOSIS — Z86718 Personal history of other venous thrombosis and embolism: Secondary | ICD-10-CM | POA: Diagnosis not present

## 2024-02-23 DIAGNOSIS — R7989 Other specified abnormal findings of blood chemistry: Secondary | ICD-10-CM | POA: Diagnosis not present

## 2024-02-23 LAB — BASIC METABOLIC PANEL WITH GFR
Anion gap: 10 (ref 5–15)
BUN: 20 mg/dL (ref 6–20)
CO2: 29 mmol/L (ref 22–32)
Calcium: 8.8 mg/dL — ABNORMAL LOW (ref 8.9–10.3)
Chloride: 98 mmol/L (ref 98–111)
Creatinine, Ser: 0.91 mg/dL (ref 0.44–1.00)
GFR, Estimated: >60 mL/min (ref >60)
Glucose, Bld: 204 mg/dL — ABNORMAL HIGH (ref 70–99)
Potassium: 3.5 mmol/L (ref 3.5–5.1)
Sodium: 137 mmol/L (ref 135–145)

## 2024-02-23 LAB — CBC WITH DIFFERENTIAL/PLATELET
Abs Immature Granulocytes: 0.06 K/uL (ref 0.00–0.07)
Basophils Absolute: 0.1 K/uL (ref 0.0–0.1)
Basophils Relative: 0 %
Eosinophils Absolute: 0.6 K/uL — ABNORMAL HIGH (ref 0.0–0.5)
Eosinophils Relative: 5 %
HCT: 32.6 % — ABNORMAL LOW (ref 36.0–46.0)
Hemoglobin: 10.2 g/dL — ABNORMAL LOW (ref 12.0–15.0)
Immature Granulocytes: 1 %
Lymphocytes Relative: 19 %
Lymphs Abs: 2.4 K/uL (ref 0.7–4.0)
MCH: 28.9 pg (ref 26.0–34.0)
MCHC: 31.3 g/dL (ref 30.0–36.0)
MCV: 92.4 fL (ref 80.0–100.0)
Monocytes Absolute: 1.3 K/uL — ABNORMAL HIGH (ref 0.1–1.0)
Monocytes Relative: 10 %
Neutro Abs: 8 K/uL — ABNORMAL HIGH (ref 1.7–7.7)
Neutrophils Relative %: 65 %
Platelets: 223 K/uL (ref 150–400)
RBC: 3.53 MIL/uL — ABNORMAL LOW (ref 3.87–5.11)
RDW: 15.3 % (ref 11.5–15.5)
WBC: 12.3 K/uL — ABNORMAL HIGH (ref 4.0–10.5)
nRBC: 0 % (ref 0.0–0.2)

## 2024-02-23 LAB — GLUCOSE, CAPILLARY
Glucose-Capillary: 136 mg/dL — ABNORMAL HIGH (ref 70–99)
Glucose-Capillary: 191 mg/dL — ABNORMAL HIGH (ref 70–99)
Glucose-Capillary: 171 mg/dL — ABNORMAL HIGH (ref 70–99)
Glucose-Capillary: 222 mg/dL — ABNORMAL HIGH (ref 70–99)

## 2024-02-23 MED ORDER — IOHEXOL 300 MG/ML  SOLN
100.0000 mL | Freq: Once | INTRAMUSCULAR | Status: AC | PRN
Start: 2024-02-23 — End: 2024-02-23
  Administered 2024-02-23: 100 mL via INTRAVENOUS

## 2024-02-23 NOTE — Progress Notes (Signed)
 [facility]                                                                               [name], is a 53 y.o. female, DOB - June 11, 1971, FMW:[id number] Admit date - 02/14/2024    Outpatient Primary MD for the patient is [name], MD  LOS - 9  days    Brief summary    53 y.o. female with medical history significant for chronic HFpEF, history of VTE on Eliquis , insulin -dependent T2DM, SLE, Sjogren's syndrome, HTN, gastroparesis, hypothyroidism, anemia, chronic back pain, morbid obesity who presented to the ED for evaluation of altered mental status  Patient recently admitted at [facility] from 6/30-7/8 for intractable nausea and vomiting related to gastroparesis. She had associated AKI. She was managed with antiemetics with improvement in nausea vomiting. She was also complaining of acute on chronic back pain.    Assessment & Plan    Assessment and Plan:  Acute encephalopathy Resolved.  Suspect from polypharmacy.  CT head negative.    New onset systolic CHF/ Acute HRrEF/ elevated troponins Cardiology on board.  ON torsemide , entresto , metoprolol  succinate  and spironolactone .  Echo this admission shows  showed EF of 35 to 40% suspect stress cardiomyopathy.  Repeat limited echo prior to discharge.    Hypokalemia Replaced.   SIRS From Pressure injury to left upper inner buttocks unstageable  She was initially started on broad spectrum IV antibiotics and narrowed to oral doxycycline .  Afebrile, but with persistent leukocytosis.  She also received 3 days of IV cefepime  for e coli in the urine.  Wound care consulted for buttocks wound.    AKI - creatinine on admission is 2, improved with diuresis.  US  rena is negative for hydronephrosis.  Creatinine around 1 and stable.    H/o DVT On eliquis .   Body mass index is 51.47 kg/m. Obesity class III   Insulin  dependent type 2 DM, uncontrolled.  CBG (last 3)  Recent Labs     02/23/24 0730 02/23/24 1137 02/23/24 1634  GLUCAP 136* 191* 171*   Increase semglee  to 30 units bid. Added novolog  5 units tidac.  Change to resistant SSI.  Get  A1c 7.3    Hypothyroidism Resume synthroid .    Chronic normocytic anemia Hemoglobin stable.    SLE/ Sjogren's syndrome  Nausea , no vomiting. Suspect from gastroparesis.  Will start her on erythromycin  to see if that will help.  Unfortunately she cannot tolerate reglan .  IV PPI added to the regimen.  Poor oral intake from nausea CT abd and pelvis is unremarkable.   Vaginal bleeding Unclear if menstrual bleeding? US  PEVIS transabdominal ultrasound did not show any acute findings. Bilateral ovaries not discretely visualized. Tranvaginal ultrasound couldn't be done due to discomfort.   Estimated body mass index is 51.47 kg/m as calculated from the following:   Height as of this encounter: 5' 9 (1.753 m).   Weight as of this encounter: 158.1 kg.  Code Status: full code.  DVT Prophylaxis:  apixaban  (ELIQUIS ) tablet 2.5 mg Start: 02/16/24 1700 apixaban  (ELIQUIS ) tablet 2.5 mg   Level of Care: Level of care: Progressive Family Communication: none at bedside.  Disposition Plan:     Remains inpatient appropriate:  pending clinical improvement.   Procedures:  None.   Consultants:   Cardiology.   Antimicrobials:   Anti-infectives (From admission, onward)    Start     Dose/Rate Route Frequency Ordered Stop   02/20/24 1715  erythromycin  (E-MYCIN ) tablet 250 mg        250 mg Oral 3 times daily with meals & bedtime 02/20/24 1625     02/19/24 1330  doxycycline  (VIBRA -TABS) tablet 100 mg        100 mg Oral Every 12 hours 02/19/24 1232     02/18/24 1000  vancomycin  (VANCOREADY) IVPB 1250 mg/250 mL  Status:  Discontinued        1,250 mg 166.7 mL/hr over 90 Minutes Intravenous Every 12 hours 02/18/24 0849 02/18/24 1320   02/17/24 2000  vancomycin  (VANCOREADY) IVPB 1500 mg/300 mL  Status:  Discontinued         1,500 mg 150 mL/hr over 120 Minutes Intravenous Every 12 hours 02/17/24 1101 02/18/24 0849   02/16/24 2000  vancomycin  (VANCOREADY) IVPB 1250 mg/250 mL  Status:  Discontinued        1,250 mg 166.7 mL/hr over 90 Minutes Intravenous Every 12 hours 02/16/24 0932 02/17/24 1101   02/15/24 2200  vancomycin  (VANCOCIN ) IVPB 1000 mg/200 mL premix  Status:  Discontinued        1,000 mg 200 mL/hr over 60 Minutes Intravenous Every 24 hours 02/14/24 2345 02/15/24 0958   02/15/24 2200  vancomycin  (VANCOREADY) IVPB 1750 mg/350 mL  Status:  Discontinued        1,750 mg 175 mL/hr over 120 Minutes Intravenous Every 24 hours 02/15/24 0958 02/16/24 0932   02/15/24 1000  ceFEPIme  (MAXIPIME ) 2 g in sodium chloride  0.9 % 100 mL IVPB  Status:  Discontinued        2 g 200 mL/hr over 30 Minutes Intravenous Every 8 hours 02/15/24 0958 02/19/24 1232   02/15/24 0900  ceFEPIme  (MAXIPIME ) 2 g in sodium chloride  0.9 % 100 mL IVPB  Status:  Discontinued        2 g 200 mL/hr over 30 Minutes Intravenous Every 12 hours 02/14/24 2345 02/15/24 0958   02/14/24 2345  metroNIDAZOLE  (FLAGYL ) IVPB 500 mg  Status:  Discontinued        500 mg 100 mL/hr over 60 Minutes Intravenous 2 times daily 02/14/24 2334 02/19/24 1232   02/14/24 2115  vancomycin  (VANCOREADY) IVPB 2000 mg/400 mL        2,000 mg 200 mL/hr over 120 Minutes Intravenous  Once 02/14/24 2110 02/15/24 0238   02/14/24 2115  ceFEPIme  (MAXIPIME ) 2 g in sodium chloride  0.9 % 100 mL IVPB        2 g 200 mL/hr over 30 Minutes Intravenous  Once 02/14/24 2110 02/14/24 2235        Medications  Scheduled Meds:  apixaban   2.5 mg Oral BID   Chlorhexidine  Gluconate Cloth  6 each Topical Daily   collagenase    Topical Daily   doxycycline   100 mg Oral Q12H   erythromycin   250 mg Oral TID WC & HS   feeding supplement  237 mL Oral BID BM   insulin  aspart  0-20 Units Subcutaneous TID WC   insulin  aspart  0-5 Units Subcutaneous QHS   insulin  aspart  5 Units Subcutaneous TID WC    insulin  glargine-yfgn  30 Units Subcutaneous BID   levothyroxine   50 mcg Oral Q0600   metoprolol  succinate  25  mg Oral Daily   pantoprazole  (PROTONIX ) IV  40 mg Intravenous Q24H   polyethylene glycol  17 g Oral Daily   sacubitril -valsartan   1 tablet Oral BID   senna-docusate  2 tablet Oral BID   sodium chloride  flush  10-40 mL Intracatheter Q12H   sodium chloride  flush  3 mL Intravenous Q12H   spironolactone   25 mg Oral Daily   torsemide   20 mg Oral Daily   Continuous Infusions: PRN Meds:.acetaminophen  **OR** acetaminophen , diphenhydrAMINE , morphine  injection, mouth rinse, prochlorperazine , sodium chloride  flush, traMADol     Subjective:   [name] was seen and examined today. Nausea is improving. No abdominal pain.    Objective:   Vitals:   02/22/24 1927 02/23/24 0336 02/23/24 0500 02/23/24 1401  BP: (!) 101/59 115/61  (!) 96/55  Pulse: 98 96  96  Resp: 20 20  16   Temp: 98.6 F (37 C) 98.9 F (37.2 C)  98.8 F (37.1 C)  TempSrc: Oral Oral  Oral  SpO2: 96% 100%  100%  Weight:   (!) 158.1 kg   Height:        Intake/Output Summary (Last 24 hours) at 02/23/2024 1815 Last data filed at 02/23/2024 1300 Gross per 24 hour  Intake 490 ml  Output 1100 ml  Net -610 ml   Filed Weights   02/22/24 0430 02/23/24 0500  Weight: (!) 158.3 kg (!) 158.1 kg     Exam General exam: Appears calm and comfortable  Respiratory system: Clear to auscultation. Respiratory effort normal. Cardiovascular system: S1 & S2 heard, RRR. No JVD,  Gastrointestinal system: Abdomen is nondistended, soft and nontender.  Central nervous system: Alert and oriented. No focal neurological deficits. Extremities: Symmetric 5 x 5 power. Skin: wounds on the left buttocks with unstageable pressure injury.  Psychiatry:  Mood & affect appropriate.        Data Reviewed:  I have personally reviewed following labs and imaging studies   CBC Lab Results  Component Value Date   WBC 12.3 (H)  02/23/2024   RBC 3.53 (L) 02/23/2024   HGB 10.2 (L) 02/23/2024   HCT 32.6 (L) 02/23/2024   MCV 92.4 02/23/2024   MCH 28.9 02/23/2024   PLT 223 02/23/2024   MCHC 31.3 02/23/2024   RDW 15.3 02/23/2024   LYMPHSABS 2.4 02/23/2024   MONOABS 1.3 (H) 02/23/2024   EOSABS 0.6 (H) 02/23/2024   BASOSABS 0.1 02/23/2024     Last metabolic panel Lab Results  Component Value Date   NA 137 02/23/2024   K 3.5 02/23/2024   CL 98 02/23/2024   CO2 29 02/23/2024   BUN 20 02/23/2024   CREATININE 0.91 02/23/2024   GLUCOSE 204 (H) 02/23/2024   GFRNONAA >60 02/23/2024   GFRAA >60 02/07/2020   CALCIUM  8.8 (L) 02/23/2024   PHOS 4.6 11/17/2022   PROT 5.2 (L) 02/16/2024   ALBUMIN  2.4 (L) 02/16/2024   LABGLOB 2.5 12/30/2021   BILITOT 1.3 (H) 02/16/2024   ALKPHOS 53 02/16/2024   AST 18 02/16/2024   ALT 13 02/16/2024   ANIONGAP 10 02/23/2024    CBG (last 3)  Recent Labs    02/23/24 0730 02/23/24 1137 02/23/24 1634  GLUCAP 136* 191* 171*      Coagulation Profile: No results for input(s): INR, PROTIME in the last 168 hours.    Radiology Studies: CT ABDOMEN PELVIS W CONTRAST Result Date: 02/23/2024 CLINICAL DATA:  Abdominal pain. EXAM: CT ABDOMEN AND PELVIS WITH CONTRAST TECHNIQUE: Multidetector CT imaging of the abdomen and pelvis  was performed using the standard protocol following bolus administration of intravenous contrast. RADIATION DOSE REDUCTION: This exam was performed according to the departmental dose-optimization program which includes automated exposure control, adjustment of the mA and/or kV according to patient size and/or use of iterative reconstruction technique. CONTRAST:  OMNIPAQUE  IOHEXOL  300 MG/ML  SOLN COMPARISON:  CT abdomen pelvis dated 01/25/2024. FINDINGS: Lower chest: The visualized lung bases are clear. No intra-abdominal free air or free fluid. Hepatobiliary: Faint ill-defined 12 mm hypodense lesion in the right lobe of the liver is not visualized on the  delayed images. This can be better evaluated with ultrasound on an outpatient/nonemergent basis. No biliary ductal dilatation. The gallbladder is unremarkable. Pancreas: Unremarkable. No pancreatic ductal dilatation or surrounding inflammatory changes. Spleen: Normal in size without focal abnormality. Adrenals/Urinary Tract: The adrenal glands are unremarkable. There is a 3 mm nonobstructing right renal upper pole calculus. No hydronephrosis. The left kidney is unremarkable. There is symmetric enhancement and excretion of contrast by both kidneys. The visualized ureters appear unremarkable. The urinary bladder is decompressed around a Foley catheter. Stomach/Bowel: There is no bowel obstruction or active inflammation. The appendix is normal. Vascular/Lymphatic: Mild atherosclerotic calcification of the abdominal aorta. The IVC is unremarkable. No portal venous gas. There is no adenopathy. Reproductive: The uterus and ovaries are grossly unremarkable. No suspicious adnexal masses. Other: None Musculoskeletal: No acute or significant osseous findings. IMPRESSION: 1. No acute intra-abdominal or pelvic pathology. 2. A 3 mm nonobstructing right renal upper pole calculus. No hydronephrosis. 3.  Aortic Atherosclerosis (ICD10-I70.0). Electronically Signed   By: Vanetta Chou M.D.   On: 02/23/2024 10:45       Elgie Butter M.D. [facility] 02/23/2024, 6:15 PM  Available via Epic secure chat 7am-7pm After 7 pm, please refer to night coverage provider listed on amion.

## 2024-02-23 NOTE — Progress Notes (Signed)
 Physical Therapy Treatment Patient Details Name: [name] MRN: [id number] DOB: [birth year] Today's Date: 02/23/2024   History of Present Illness Pt is 53 yo female admitted on 02/14/24 with acute encephalopathy (likely in setting of polypharmacy), SIRS, buttock wounds, AKI, and CHF.  Pt with hx including but not limited to chronic HFpEF, history of VTE on Eliquis , insulin -dependent T2DM, SLE, Sjogren's syndrome, HTN, gastroparesis, hypothyroidism, anemia, chronic back pain, morbid obesity    PT Comments  Moved from ICU Pt needs a Bariatric recliner in her current room  Cognition Comments: AxO x 3 pleasant and willing.  Lives home alone and was amb short distances from room to room with seated rest breaks on her Rollator seat.  I get tired. Pt states she has a HH Aide 7x/wk mid day 4 hours.  Pt stated she fell when transfering to [facility] landing on knees. Her Aide called 911 Assisted Pt to EOB went well. General bed mobility comments: Pt self able to get upper body upright using bed rails and elevating HOB while self sliding B LE but required increased asisst to complete scooting to EOB .  Increased buttock pain with this activity.  Pt stated she sleeps in a bed at home and was able to self perform.  General transfer comment: Pt was able to stand EOB at Mod Assist + 2 using Bariatric RW.  I'm scared, stated Pt.  Pt tolerated statuc standing x 45 seconds before requesting to sit back down.  Pt declined to get to recliner.  That chair is horrible, stated Pt.  Pt actually needs a Baritric recliner due to hip girth.  Assisted back to EOB. LPT has rec Pt will need ST Rehab at SNF to address mobility and functional decline prior to safely returning home however per chart review, Pt prefers to return home.     If plan is discharge home, recommend the following: A lot of help with walking and/or transfers;A lot of help with bathing/dressing/bathroom   Can travel by private vehicle     No  Equipment  Recommendations  None recommended by PT    Recommendations for Other Services       Precautions / Restrictions Precautions Precautions: Fall Precaution/Restrictions Comments: buttock wounds - painful Restrictions Weight Bearing Restrictions Per Provider Order: No     Mobility  Bed Mobility Overal bed mobility: Needs Assistance Bed Mobility: Supine to Sit, Sit to Supine     Supine to sit: Mod assist, Max assist     General bed mobility comments: Pt self able to get upper body upright using bed rails and elevating HOB while self sliding B LE but required increased asisst to complete scooting to EOB .  Increased buttock pain with this activity.  Pt stated she sleeps in a bed at home and was able to self perform.    Transfers Overall transfer level: Needs assistance Equipment used: Rolling walker (2 wheels) Transfers: Sit to/from Stand Sit to Stand: Mod assist, +2 physical assistance           General transfer comment: Pt was able to stand EOB at Mod Assist + 2 using Bariatric RW.  I'm scared, stated Pt.  Pt tolerated statuc standing x 45 seconds before requesting to sit back down.  Pt declined to get to recliner.  That chair is horrible, stated Pt.  Pt actually needs a Baritric recliner due to hip girth.  Assisted back to EOB.    Ambulation/Gait  General Gait Details: Pt declined as it was late in the day c/o fatigue as well as increased anxiety/fear of falling.   Stairs             Wheelchair Mobility     Tilt Bed    Modified Rankin (Stroke Patients Only)       Balance                                            Communication    Cognition Arousal: Alert Behavior During Therapy: WFL for tasks assessed/performed   PT - Cognitive impairments: No apparent impairments                       PT - Cognition Comments: AxO x 3 pleasant and willing.  Lives home alone and was amb short distances from room to  room with seated rest breaks on her Rollator seat.  I get tired. Pt states she has a HH Aide 7x/wk mid day 4 hours.  Pt stated she fell when transfering to [facility] landing on knees. Her Aide called 911 Following commands: Intact      Cueing Cueing Techniques: Verbal cues  Exercises      General Comments        Pertinent Vitals/Pain Pain Assessment Pain Assessment: Faces Faces Pain Scale: Hurts a little bit Pain Location: wounds and low back with bed mobility Pain Descriptors / Indicators: Grimacing Pain Intervention(s): Monitored during session, Repositioned    Home Living                          Prior Function            PT Goals (current goals can now be found in the care plan section)      Frequency    Min 2X/week      PT Plan      Co-evaluation              AM-PAC PT 6 Clicks Mobility   Outcome Measure  Help needed turning from your back to your side while in a flat bed without using bedrails?: A Lot Help needed moving from lying on your back to sitting on the side of a flat bed without using bedrails?: A Lot Help needed moving to and from a bed to a chair (including a wheelchair)?: A Lot Help needed standing up from a chair using your arms (e.g., wheelchair or bedside chair)?: A Lot Help needed to walk in hospital room?: Total Help needed climbing 3-5 steps with a railing? : Total 6 Click Score: 10    End of Session Equipment Utilized During Treatment: Gait belt Activity Tolerance: Patient limited by fatigue Patient left: in bed;with call bell/phone within reach Nurse Communication: Mobility status PT Visit Diagnosis: Other abnormalities of gait and mobility (R26.89);Muscle weakness (generalized) (M62.81)     Time: 8568-8543 PT Time Calculation (min) (ACUTE ONLY): 25 min  Charges:    $Therapeutic Activity: 23-37 mins PT General Charges $$ ACUTE PT VISIT: 1 Visit                     Katheryn Leap  PTA Acute  Rehabilitation  Services Office M-F          [phone]

## 2024-02-23 NOTE — Progress Notes (Addendum)
 Progress Note  Patient Name: [name] Date of Encounter: 02/23/2024 [facility] Cardiologist: Gordy Bergamo, MD   Interval Summary   Patient reports feeling better, nausea improving  She has spoken with her sister but is still unsure if she wants to pursue heart cath  Vital Signs Vitals:   02/22/24 1800 02/22/24 1927 02/23/24 0336 02/23/24 0500  BP:  (!) 101/59 115/61   Pulse:  98 96   Resp: 19 20 20    Temp:  98.6 F (37 C) 98.9 F (37.2 C)   TempSrc:  Oral Oral   SpO2:  96% 100%   Weight:    (!) 158.1 kg  Height:        Intake/Output Summary (Last 24 hours) at 02/23/2024 1030 Last data filed at 02/23/2024 1020 Gross per 24 hour  Intake 710 ml  Output 1500 ml  Net -790 ml      02/23/2024    5:00 AM 02/22/2024    4:30 AM 02/21/2024    5:00 AM  Last 3 Weights  Weight (lbs) 348 lb 8.8 oz 348 lb 15.8 oz --  Weight (kg) 158.1 kg 158.3 kg --      Telemetry/ECG  Sinus rhythm, HR 90s- Personally Reviewed  Physical Exam  GEN: No acute distress.   Neck: No JVD Cardiac: RRR, no murmurs, rubs, or gallops.  Respiratory: Clear to auscultation bilaterally. GI: Soft, nontender, non-distended  MS: No edema  Assessment & Plan  Acute HFrEF  Hypertension  EF previously as low as 30-35% LHC in 2017 showed normal coronaries Treated as nonischemic cardiomyopathy with GDMT Echo 09/2022 showed EF 55-60%, normal RV function, mild LVH, normal PASP Echo this admission: EF 35-40%, wall motion abnormalities consistent with Takotsubo cardiomyopathy vs LAD infarct  Patient denies any active or prior chest pain EKG without ischemic changes GDMT initially held due to hypotension, AKI but has been added back  Most recent BP 115/61 Net - 16 L this admission  Pending AM labs  Continue Entresto  49-51 mg BID Continue Toprol  25 mg daily Continue spironolactone  25 mg daily Continue PO torsemide  20 mg daily, UOP is decreasing.  No SGLT2i until wounds heal  Discussed potential  cardiac catheterization towards the end of her hospital stay, patient is a bit hesitant and is not ready to make a decision yet --would have to hold Eliquis  prior to cath Plan for repeat limited echocardiogram prior to discharge, especially if patient defers catheterization during this admission   Elevated troponin level  Denies chest pain, shortness of breath, orthopnea Denies any exertional symptoms -- although fairly sedentary Troponin 857 > 1,370 > 1,210 LHC 2017 showed normal coronaries however with diabetes with complications including gastroparesis. A1c 7.3%  Discussed possible cardiac catheterization with patient, as above Not on ASA due to Eliquis  use for history of DVT   Per primary History of DVT Acute encephalopathy SIRS Insulin  dependent type 2 diabetes with gastroparesis  Chronic anemia Hypothyroidism Chronic back pain SLE Sjgren's   For questions or updates, please contact [facility] Please consult [email or web address] for contact info under       Signed, Waddell DELENA Donath, PA-C   Patient seen and examined with TP PA-C.  Agree as above, with the following exceptions and changes as noted below. Patient hesitant for cath due to fear of having to go to [facility]. We discussed option of receiving care at an alternate facility if needed. Gen: NAD, CV: RRR, no murmurs, Lungs: clear, Abd: soft, Extrem:  Warm, well perfused, no edema, Neuro/Psych: alert and oriented x 3, normal mood and affect. All available labs, radiology testing, previous records reviewed. We will repeat limited echo to see if LV function has recovered at all in a stress cardiomyopathy picture. If not, would consider outpatient cardiac cath. HF therapy is overall stable.   Sakina Briones A Shantera Monts, MD 02/23/24 3:45 PM

## 2024-02-24 ENCOUNTER — Inpatient Hospital Stay (HOSPITAL_COMMUNITY)

## 2024-02-24 DIAGNOSIS — N179 Acute kidney failure, unspecified: Secondary | ICD-10-CM | POA: Diagnosis not present

## 2024-02-24 DIAGNOSIS — I5021 Acute systolic (congestive) heart failure: Secondary | ICD-10-CM | POA: Diagnosis not present

## 2024-02-24 DIAGNOSIS — Z86718 Personal history of other venous thrombosis and embolism: Secondary | ICD-10-CM | POA: Diagnosis not present

## 2024-02-24 DIAGNOSIS — R7989 Other specified abnormal findings of blood chemistry: Secondary | ICD-10-CM | POA: Diagnosis not present

## 2024-02-24 DIAGNOSIS — G9340 Encephalopathy, unspecified: Secondary | ICD-10-CM | POA: Diagnosis not present

## 2024-02-24 LAB — CBC WITH DIFFERENTIAL/PLATELET
Abs Immature Granulocytes: 0.04 K/uL (ref 0.00–0.07)
Basophils Absolute: 0 K/uL (ref 0.0–0.1)
Basophils Relative: 0 %
Eosinophils Absolute: 0.7 K/uL — ABNORMAL HIGH (ref 0.0–0.5)
Eosinophils Relative: 5 %
HCT: 30.8 % — ABNORMAL LOW (ref 36.0–46.0)
Hemoglobin: 9.7 g/dL — ABNORMAL LOW (ref 12.0–15.0)
Immature Granulocytes: 0 %
Lymphocytes Relative: 22 %
Lymphs Abs: 2.7 K/uL (ref 0.7–4.0)
MCH: 28.8 pg (ref 26.0–34.0)
MCHC: 31.5 g/dL (ref 30.0–36.0)
MCV: 91.4 fL (ref 80.0–100.0)
Monocytes Absolute: 1.5 K/uL — ABNORMAL HIGH (ref 0.1–1.0)
Monocytes Relative: 12 %
Neutro Abs: 7.6 K/uL (ref 1.7–7.7)
Neutrophils Relative %: 61 %
Platelets: 223 K/uL (ref 150–400)
RBC: 3.37 MIL/uL — ABNORMAL LOW (ref 3.87–5.11)
RDW: 15.3 % (ref 11.5–15.5)
WBC: 12.5 K/uL — ABNORMAL HIGH (ref 4.0–10.5)
nRBC: 0 % (ref 0.0–0.2)

## 2024-02-24 LAB — ECHOCARDIOGRAM LIMITED
Calc EF: 70.2 %
Height: 69 in
S' Lateral: 3.35 cm
Single Plane A2C EF: 68 %
Single Plane A4C EF: 71.8 %
Weight: 5396.86 [oz_av]

## 2024-02-24 LAB — GLUCOSE, CAPILLARY
Glucose-Capillary: 148 mg/dL — ABNORMAL HIGH (ref 70–99)
Glucose-Capillary: 179 mg/dL — ABNORMAL HIGH (ref 70–99)
Glucose-Capillary: 122 mg/dL — ABNORMAL HIGH (ref 70–99)
Glucose-Capillary: 109 mg/dL — ABNORMAL HIGH (ref 70–99)

## 2024-02-24 LAB — BASIC METABOLIC PANEL WITH GFR
Anion gap: 11 (ref 5–15)
BUN: 23 mg/dL — ABNORMAL HIGH (ref 6–20)
CO2: 29 mmol/L (ref 22–32)
Calcium: 8.8 mg/dL — ABNORMAL LOW (ref 8.9–10.3)
Chloride: 98 mmol/L (ref 98–111)
Creatinine, Ser: 1.12 mg/dL — ABNORMAL HIGH (ref 0.44–1.00)
GFR, Estimated: 59 mL/min — ABNORMAL LOW (ref >60)
Glucose, Bld: 139 mg/dL — ABNORMAL HIGH (ref 70–99)
Potassium: 3.3 mmol/L — ABNORMAL LOW (ref 3.5–5.1)
Sodium: 138 mmol/L (ref 135–145)

## 2024-02-24 LAB — MAGNESIUM: Magnesium: 1.6 mg/dL — ABNORMAL LOW (ref 1.7–2.4)

## 2024-02-24 MED ORDER — ROSUVASTATIN CALCIUM 20 MG PO TABS
20.0000 mg | ORAL_TABLET | Freq: Every day | ORAL | Status: DC
  Administered 2024-02-24 – 2024-02-29 (×6): 20 mg via ORAL
  Filled 2024-02-24 (×6): qty 1

## 2024-02-24 MED ORDER — PANTOPRAZOLE SODIUM 40 MG PO TBEC
40.0000 mg | DELAYED_RELEASE_TABLET | Freq: Every day | ORAL | Status: DC
  Administered 2024-02-24 – 2024-02-29 (×6): 40 mg via ORAL
  Filled 2024-02-24 (×6): qty 1

## 2024-02-24 MED ORDER — POTASSIUM CHLORIDE CRYS ER 20 MEQ PO TBCR
40.0000 meq | EXTENDED_RELEASE_TABLET | Freq: Two times a day (BID) | ORAL | Status: AC
  Administered 2024-02-24 (×2): 40 meq via ORAL
  Filled 2024-02-24 (×2): qty 2

## 2024-02-24 MED ORDER — PERFLUTREN LIPID MICROSPHERE
1.0000 mL | INTRAVENOUS | Status: AC | PRN
  Administered 2024-02-24: 2 mL via INTRAVENOUS

## 2024-02-24 NOTE — Progress Notes (Signed)
  Echocardiogram 2D Echocardiogram has been performed.  [name] 02/24/2024, 9:42 AM

## 2024-02-24 NOTE — Progress Notes (Signed)
 She refuses to stand again. We are unable to get a full orthostatic vitals. MD notified via Secure chat.

## 2024-02-24 NOTE — Progress Notes (Signed)
 Mobility Specialist - Progress Note   02/24/24 1146  Mobility  Activity Stood at bedside  Level of Assistance +2 (takes two people)  Location manager Ambulated (ft) 2 ft  Range of Motion/Exercises Active Assistive  Activity Response Tolerated fair  Mobility Referral Yes  Mobility visit 1 Mobility  Mobility Specialist Start Time (ACUTE ONLY) 1115  Mobility Specialist Stop Time (ACUTE ONLY) 1146  Mobility Specialist Time Calculation (min) (ACUTE ONLY) 31 min   Pt was found in bed and agreeable to mobilize. RN and NT in room during session to assist. Pt able to stand and tak x1 step forward and x1 step back. Grew fatigued with session. At EOS returned to bed with all needs met. Call bell in reach.  Erminio Leos,  Mobility Specialist Can be reached via Secure Chat

## 2024-02-24 NOTE — Progress Notes (Signed)
 [facility]                                                                               [name], is a 53 y.o. female, DOB - [birth year], FMW:[id number] Admit date - 02/14/2024    Outpatient Primary MD for the patient is [name], MD  LOS - 10  days    Brief summary    53 y.o. female with medical history significant for chronic HFpEF, history of VTE on Eliquis , insulin -dependent T2DM, SLE, Sjogren's syndrome, HTN, gastroparesis, hypothyroidism, anemia, chronic back pain, morbid obesity who presented to the ED for evaluation of altered mental status  Patient recently admitted at [facility] from 6/30-7/8 for intractable nausea and vomiting related to gastroparesis. She had associated AKI. She was managed with antiemetics with improvement in nausea  and vomiting.   Assessment & Plan    Assessment and Plan:  Acute encephalopathy Resolved.  Suspect from polypharmacy.  CT head negative.  Therapy eval recommending SNF. But patient wanted to go home.    New onset systolic CHF/ Acute HRrEF/ elevated troponins Cardiology on board.  ON torsemide  20 mg daily,  entresto , metoprolol  succinate 25 mg daily  and spironolactone  25 mg dialy,   Echo this admission shows  showed EF of 35 to 40% suspect stress cardiomyopathy.  Limited echocardiogram showed improvement in the LVEF, likely stress cardiomyopathy.  Cardiology defer inpatient cath. Recommend outpatient follow up with cardiology for further work up.    Hypokalemia Replaced. Repeat in am.   SIRS From Pressure injury to left upper inner buttocks unstageable  She was initially started on broad spectrum IV antibiotics and narrowed to oral doxycycline .  Afebrile, but with persistent leukocytosis.  She also received 3 days of IV cefepime  for e coli in the urine.  Wound care consulted for buttocks wound.  Patient requested for outpatient wound care center follow up .  She reports having a HHA for  4 hours for 7 days a week as she is on disability.  She wanted her HHA to come to the hospital and see how the wound dressings are done , so they can be done at home on discharge.  The earliest the HHA can come is on Monday.    AKI - creatinine on admission is 2, improved with diuresis.  US  rena is negative for hydronephrosis.  Creatinine around 1 and stable.    H/o DVT On eliquis .   Body mass index is 49.81 kg/m. Obesity class III Recommend outpatient follow up with bariatric sugery.    Insulin  dependent type 2 DM, uncontrolled.  CBG (last 3)  Recent Labs    02/23/24 2048 02/24/24 0722 02/24/24 1209  GLUCAP 222* 148* 179*   Increase semglee  to 30 units bid. Added novolog  5 units tidac.  Change to resistant SSI.  A1c 7.3    Hypothyroidism Resume synthroid .    Chronic normocytic anemia Hemoglobin stable.    SLE/ Sjogren's syndrome  Nausea , no vomiting. Suspect from gastroparesis.  Will start her on erythromycin  to see if that will help.  Unfortunately she cannot tolerate reglan .  IV PPI added to the regimen.  Poor oral intake  from nausea CT abd and pelvis is unremarkable.   Vaginal bleeding Unclear if menstrual bleeding? US  PEVIS transabdominal ultrasound did not show any acute findings. Bilateral ovaries not discretely visualized. Tranvaginal ultrasound couldn't be done due to discomfort.  CT abd and pelvis done with contrast done did not show any acute intra-abdominal or pelvic pathology.   Estimated body mass index is 49.81 kg/m as calculated from the following:   Height as of this encounter: 5' 9 (1.753 m).   Weight as of this encounter: 153 kg.  Code Status: full code.  DVT Prophylaxis:  apixaban  (ELIQUIS ) tablet 2.5 mg Start: 02/16/24 1700 apixaban  (ELIQUIS ) tablet 2.5 mg   Level of Care: Level of care: Progressive Family Communication: none at bedside.   Disposition Plan:     Remains inpatient appropriate:  pending clinical improvement.    Procedures:  None.   Consultants:   Cardiology.   Antimicrobials:   Anti-infectives (From admission, onward)    Start     Dose/Rate Route Frequency Ordered Stop   02/20/24 1715  erythromycin  (E-MYCIN ) tablet 250 mg        250 mg Oral 3 times daily with meals & bedtime 02/20/24 1625     02/19/24 1330  doxycycline  (VIBRA -TABS) tablet 100 mg        100 mg Oral Every 12 hours 02/19/24 1232     02/18/24 1000  vancomycin  (VANCOREADY) IVPB 1250 mg/250 mL  Status:  Discontinued        1,250 mg 166.7 mL/hr over 90 Minutes Intravenous Every 12 hours 02/18/24 0849 02/18/24 1320   02/17/24 2000  vancomycin  (VANCOREADY) IVPB 1500 mg/300 mL  Status:  Discontinued        1,500 mg 150 mL/hr over 120 Minutes Intravenous Every 12 hours 02/17/24 1101 02/18/24 0849   02/16/24 2000  vancomycin  (VANCOREADY) IVPB 1250 mg/250 mL  Status:  Discontinued        1,250 mg 166.7 mL/hr over 90 Minutes Intravenous Every 12 hours 02/16/24 0932 02/17/24 1101   02/15/24 2200  vancomycin  (VANCOCIN ) IVPB 1000 mg/200 mL premix  Status:  Discontinued        1,000 mg 200 mL/hr over 60 Minutes Intravenous Every 24 hours 02/14/24 2345 02/15/24 0958   02/15/24 2200  vancomycin  (VANCOREADY) IVPB 1750 mg/350 mL  Status:  Discontinued        1,750 mg 175 mL/hr over 120 Minutes Intravenous Every 24 hours 02/15/24 0958 02/16/24 0932   02/15/24 1000  ceFEPIme  (MAXIPIME ) 2 g in sodium chloride  0.9 % 100 mL IVPB  Status:  Discontinued        2 g 200 mL/hr over 30 Minutes Intravenous Every 8 hours 02/15/24 0958 02/19/24 1232   02/15/24 0900  ceFEPIme  (MAXIPIME ) 2 g in sodium chloride  0.9 % 100 mL IVPB  Status:  Discontinued        2 g 200 mL/hr over 30 Minutes Intravenous Every 12 hours 02/14/24 2345 02/15/24 0958   02/14/24 2345  metroNIDAZOLE  (FLAGYL ) IVPB 500 mg  Status:  Discontinued        500 mg 100 mL/hr over 60 Minutes Intravenous 2 times daily 02/14/24 2334 02/19/24 1232   02/14/24 2115  vancomycin  (VANCOREADY) IVPB  2000 mg/400 mL        2,000 mg 200 mL/hr over 120 Minutes Intravenous  Once 02/14/24 2110 02/15/24 0238   02/14/24 2115  ceFEPIme  (MAXIPIME ) 2 g in sodium chloride  0.9 % 100 mL IVPB        2  g 200 mL/hr over 30 Minutes Intravenous  Once 02/14/24 2110 02/14/24 2235        Medications  Scheduled Meds:  apixaban   2.5 mg Oral BID   Chlorhexidine  Gluconate Cloth  6 each Topical Daily   collagenase    Topical Daily   doxycycline   100 mg Oral Q12H   erythromycin   250 mg Oral TID WC & HS   feeding supplement  237 mL Oral BID BM   insulin  aspart  0-20 Units Subcutaneous TID WC   insulin  aspart  0-5 Units Subcutaneous QHS   insulin  aspart  5 Units Subcutaneous TID WC   insulin  glargine-yfgn  30 Units Subcutaneous BID   levothyroxine   50 mcg Oral Q0600   metoprolol  succinate  25 mg Oral Daily   pantoprazole   40 mg Oral Daily   polyethylene glycol  17 g Oral Daily   sacubitril -valsartan   1 tablet Oral BID   senna-docusate  2 tablet Oral BID   sodium chloride  flush  10-40 mL Intracatheter Q12H   sodium chloride  flush  3 mL Intravenous Q12H   spironolactone   25 mg Oral Daily   torsemide   20 mg Oral Daily   Continuous Infusions: PRN Meds:.acetaminophen  **OR** acetaminophen , diphenhydrAMINE , morphine  injection, mouth rinse, prochlorperazine , sodium chloride  flush, traMADol     Subjective:   [name] was seen and examined today. Nausea has improved.  She denies any  new complaints.    Objective:   Vitals:   02/24/24 0330 02/24/24 0332 02/24/24 0800 02/24/24 1234  BP: (!) 101/54  102/77 (!) 99/59  Pulse: 96  94 94  Resp: 20   18  Temp: 98.5 F (36.9 C)  98.7 F (37.1 C) 98.8 F (37.1 C)  TempSrc: Oral  Oral Oral  SpO2: 91%  99% 100%  Weight:  (!) 153 kg    Height:        Intake/Output Summary (Last 24 hours) at 02/24/2024 1334 Last data filed at 02/24/2024 1238 Gross per 24 hour  Intake 150 ml  Output 2150 ml  Net -2000 ml   Filed Weights   02/22/24 0430 02/23/24  0500 02/24/24 0332  Weight: (!) 158.3 kg (!) 158.1 kg (!) 153 kg     Exam General exam: Appears calm and comfortable  Respiratory system: Clear to auscultation. Respiratory effort normal. Cardiovascular system: S1 & S2 heard, RRR.  Gastrointestinal system: Abdomen is nondistended, soft and nontender.  Central nervous system: Alert and oriented.  Extremities: no cyanosis.  Skin: left buttocks pressure injury.  Psychiatry: Mood & affect appropriate.        Data Reviewed:  I have personally reviewed following labs and imaging studies   CBC Lab Results  Component Value Date   WBC 12.5 (H) 02/24/2024   RBC 3.37 (L) 02/24/2024   HGB 9.7 (L) 02/24/2024   HCT 30.8 (L) 02/24/2024   MCV 91.4 02/24/2024   MCH 28.8 02/24/2024   PLT 223 02/24/2024   MCHC 31.5 02/24/2024   RDW 15.3 02/24/2024   LYMPHSABS 2.7 02/24/2024   MONOABS 1.5 (H) 02/24/2024   EOSABS 0.7 (H) 02/24/2024   BASOSABS 0.0 02/24/2024     Last metabolic panel Lab Results  Component Value Date   NA 138 02/24/2024   K 3.3 (L) 02/24/2024   CL 98 02/24/2024   CO2 29 02/24/2024   BUN 23 (H) 02/24/2024   CREATININE 1.12 (H) 02/24/2024   GLUCOSE 139 (H) 02/24/2024   GFRNONAA 59 (L) 02/24/2024   GFRAA >60 02/07/2020  CALCIUM  8.8 (L) 02/24/2024   PHOS 4.6 11/17/2022   PROT 5.2 (L) 02/16/2024   ALBUMIN  2.4 (L) 02/16/2024   LABGLOB 2.5 12/30/2021   BILITOT 1.3 (H) 02/16/2024   ALKPHOS 53 02/16/2024   AST 18 02/16/2024   ALT 13 02/16/2024   ANIONGAP 11 02/24/2024    CBG (last 3)  Recent Labs    02/23/24 2048 02/24/24 0722 02/24/24 1209  GLUCAP 222* 148* 179*      Coagulation Profile: No results for input(s): INR, PROTIME in the last 168 hours.    Radiology Studies: ECHOCARDIOGRAM LIMITED Result Date: 02/24/2024    ECHOCARDIOGRAM LIMITED REPORT   Patient Name:   [name] Date of Exam: 02/24/2024 Medical Rec #:  [id number]     Height:       69.0 in Accession #:    [id number]    Weight:        337.3 lb Date of Birth:  [birth year]     BSA:          2.580 m Patient Age:    53 years      BP:           101/54 mmHg Patient Gender: F             HR:           90 bpm. Exam Location:  Inpatient Procedure: Limited Echo, Cardiac Doppler, Color Doppler and Intracardiac            Opacification Agent (Both Spectral and Color Flow Doppler were            utilized during procedure). Indications:    I50.40* Unspecified combined systolic (congestive) and diastolic                 (congestive) heart failure  History:        Patient has prior history of Echocardiogram examinations, most                 recent 02/15/2024. CHF, Previous Myocardial Infarction, Stroke,                 Signs/Symptoms:Altered Mental Status and Bacteremia; Risk                 Factors:Hypertension.  Sonographer:    Ellouise Mose RDCS Referring Phys: [id number] SOYLA DELENA MERCK  Sonographer Comments: Technically difficult study due to poor echo windows and patient is obese. Image acquisition challenging due to patient body habitus. Limited study for LV function and wall motion. IMPRESSIONS  1. Left ventricular ejection fraction, by estimation, is 65 to 70%. The left ventricle has normal function. The left ventricle has no regional wall motion abnormalities. There is mild asymmetric left ventricular hypertrophy of the infero-lateral segment.  2. Right ventricular systolic function is normal. The right ventricular size is normal. There is normal pulmonary artery systolic pressure.  3. A small pericardial effusion is present. There is no evidence of cardiac tamponade.  4. The mitral valve is grossly normal. Trivial mitral valve regurgitation.  5. The aortic valve is tricuspid. Aortic valve regurgitation is not visualized.  6. The inferior vena cava is normal in size with greater than 50% respiratory variability, suggesting right atrial pressure of 3 mmHg. Comparison(s): The left ventricular function has improved. The left ventricular wall motion  abnormalities are improved. FINDINGS  Left Ventricle: Left ventricular ejection fraction, by estimation, is 65 to 70%. The left ventricle has normal function. The left ventricle has no regional wall  motion abnormalities. Definity  contrast agent was given IV to delineate the left ventricular  endocardial borders. There is mild asymmetric left ventricular hypertrophy of the infero-lateral segment. Right Ventricle: The right ventricular size is normal. Right vetricular wall thickness was not well visualized. Right ventricular systolic function is normal. There is normal pulmonary artery systolic pressure. The tricuspid regurgitant velocity is 2.32 m/s, and with an assumed right atrial pressure of 3 mmHg, the estimated right ventricular systolic pressure is 24.5 mmHg. Pericardium: A small pericardial effusion is present. There is no evidence of cardiac tamponade. Mitral Valve: The mitral valve is grossly normal. Trivial mitral valve regurgitation. Tricuspid Valve: The tricuspid valve is grossly normal. Tricuspid valve regurgitation is mild . No evidence of tricuspid stenosis. Aortic Valve: The aortic valve is tricuspid. Aortic valve regurgitation is not visualized. Aorta: The aortic root and ascending aorta are structurally normal, with no evidence of dilitation. Venous: The inferior vena cava is normal in size with greater than 50% respiratory variability, suggesting right atrial pressure of 3 mmHg. Additional Comments: Spectral Doppler performed. Color Doppler performed.  LEFT VENTRICLE PLAX 2D LVIDd:         5.60 cm LVIDs:         3.35 cm LV PW:         1.25 cm LV IVS:        0.95 cm  LV Volumes (MOD) LV vol d, MOD A2C: 127.0 ml LV vol d, MOD A4C: 103.0 ml LV vol s, MOD A2C: 40.6 ml LV vol s, MOD A4C: 29.0 ml LV SV MOD A2C:     86.4 ml LV SV MOD A4C:     103.0 ml LV SV MOD BP:      80.0 ml IVC IVC diam: 1.60 cm  AORTA Ao Asc diam: 3.20 cm TRICUSPID VALVE TR Peak grad:   21.5 mmHg TR Vmax:        232.00 cm/s Soyla Merck MD Electronically signed by Soyla Merck MD Signature Date/Time: 02/24/2024/10:07:51 AM    Final    CT ABDOMEN PELVIS W CONTRAST Result Date: 02/23/2024 CLINICAL DATA:  Abdominal pain. EXAM: CT ABDOMEN AND PELVIS WITH CONTRAST TECHNIQUE: Multidetector CT imaging of the abdomen and pelvis was performed using the standard protocol following bolus administration of intravenous contrast. RADIATION DOSE REDUCTION: This exam was performed according to the departmental dose-optimization program which includes automated exposure control, adjustment of the mA and/or kV according to patient size and/or use of iterative reconstruction technique. CONTRAST:  OMNIPAQUE  IOHEXOL  300 MG/ML  SOLN COMPARISON:  CT abdomen pelvis dated 01/25/2024. FINDINGS: Lower chest: The visualized lung bases are clear. No intra-abdominal free air or free fluid. Hepatobiliary: Faint ill-defined 12 mm hypodense lesion in the right lobe of the liver is not visualized on the delayed images. This can be better evaluated with ultrasound on an outpatient/nonemergent basis. No biliary ductal dilatation. The gallbladder is unremarkable. Pancreas: Unremarkable. No pancreatic ductal dilatation or surrounding inflammatory changes. Spleen: Normal in size without focal abnormality. Adrenals/Urinary Tract: The adrenal glands are unremarkable. There is a 3 mm nonobstructing right renal upper pole calculus. No hydronephrosis. The left kidney is unremarkable. There is symmetric enhancement and excretion of contrast by both kidneys. The visualized ureters appear unremarkable. The urinary bladder is decompressed around a Foley catheter. Stomach/Bowel: There is no bowel obstruction or active inflammation. The appendix is normal. Vascular/Lymphatic: Mild atherosclerotic calcification of the abdominal aorta. The IVC is unremarkable. No portal venous gas. There is no adenopathy. Reproductive: The  uterus and ovaries are grossly unremarkable. No suspicious  adnexal masses. Other: None Musculoskeletal: No acute or significant osseous findings. IMPRESSION: 1. No acute intra-abdominal or pelvic pathology. 2. A 3 mm nonobstructing right renal upper pole calculus. No hydronephrosis. 3.  Aortic Atherosclerosis (ICD10-I70.0). Electronically Signed   By: Vanetta Chou M.D.   On: 02/23/2024 10:45       Elgie Butter M.D. [facility] 02/24/2024, 1:34 PM  Available via Epic secure chat 7am-7pm After 7 pm, please refer to night coverage provider listed on amion.

## 2024-02-24 NOTE — Plan of Care (Signed)
Cont with plan of care

## 2024-02-24 NOTE — Progress Notes (Addendum)
  Progress Note  Patient Name: [name] Date of Encounter: 02/24/2024 [facility] Cardiologist: Gordy Bergamo, MD   Interval Summary   EF has improved, we discussed plan. NAEO.  Vital Signs Vitals:   02/23/24 2015 02/24/24 0330 02/24/24 0332 02/24/24 0800  BP: 100/67 (!) 101/54  102/77  Pulse: 96 96  94  Resp:  20    Temp:  98.5 F (36.9 C)  98.7 F (37.1 C)  TempSrc:  Oral  Oral  SpO2:  91%  99%  Weight:   (!) 153 kg   Height:        Intake/Output Summary (Last 24 hours) at 02/24/2024 1019 Last data filed at 02/24/2024 0700 Gross per 24 hour  Intake 400 ml  Output 1450 ml  Net -1050 ml      02/24/2024    3:32 AM 02/23/2024    5:00 AM 02/22/2024    4:30 AM  Last 3 Weights  Weight (lbs) 337 lb 4.9 oz 348 lb 8.8 oz 348 lb 15.8 oz  Weight (kg) 153 kg 158.1 kg 158.3 kg      Telemetry/ECG  Sinus rhythm, HR 90s- Personally Reviewed  Physical Exam  GEN: No acute distress.   Neck: No JVD Cardiac: RRR, no murmurs, rubs, or gallops.  Respiratory: Clear to auscultation bilaterally. GI: Soft, nontender, non-distended  MS: No edema  Assessment & Plan  Acute HFrEF  Hypertension  EF previously as low as 30-35% LHC in 2017 showed normal coronaries Treated as nonischemic cardiomyopathy with GDMT Echo 09/2022 showed EF 55-60%, normal RV function, mild LVH, normal PASP Echo this admission: EF 35-40%, wall motion abnormalities consistent with Takotsubo cardiomyopathy vs LAD infarct  Patient denies any active or prior chest pain EKG without ischemic changes GDMT initially held due to hypotension, AKI but has been added back  Most recent BP 115/61 Net - 16 L this admission  Pending AM labs  Continue Entresto  49-51 mg BID Continue Toprol  25 mg daily Continue spironolactone  25 mg daily Continue PO torsemide  20 mg daily Repeat echo showed normalization of EF, findings likely represent stress cardiomyopathy. Will defer cath inpatient, can be discussed as outpatient if  patient has any recurrent symptoms of chest pain or repeat troponin elevations.    Elevated troponin level  Denies chest pain, shortness of breath, orthopnea Denies any exertional symptoms -- although fairly sedentary Troponin 857 > 1,370 > 1,210 LHC 2017 showed normal coronaries however with diabetes with complications including gastroparesis. A1c 7.3%  Defer cath as above. Not on ASA due to Eliquis  use for history of DVT  Cardiology will sign off.      Per primary History of DVT Acute encephalopathy SIRS Insulin  dependent type 2 diabetes with gastroparesis  Chronic anemia Hypothyroidism Chronic back pain SLE Sjgren's   For questions or updates, please contact [facility] Please consult [email or web address] for contact info under       Signed, Soyla DELENA Merck, MD  02/24/24 10:19 AM

## 2024-02-25 DIAGNOSIS — N179 Acute kidney failure, unspecified: Secondary | ICD-10-CM | POA: Diagnosis not present

## 2024-02-25 DIAGNOSIS — G9340 Encephalopathy, unspecified: Secondary | ICD-10-CM | POA: Diagnosis not present

## 2024-02-25 DIAGNOSIS — Z86718 Personal history of other venous thrombosis and embolism: Secondary | ICD-10-CM | POA: Diagnosis not present

## 2024-02-25 DIAGNOSIS — R7989 Other specified abnormal findings of blood chemistry: Secondary | ICD-10-CM | POA: Diagnosis not present

## 2024-02-25 LAB — LIPID PANEL
Cholesterol: 120 mg/dL (ref 0–200)
HDL: 31 mg/dL — ABNORMAL LOW (ref >40)
LDL Cholesterol: 74 mg/dL (ref 0–99)
Total CHOL/HDL Ratio: 3.9 ratio
Triglycerides: 74 mg/dL (ref <150)
VLDL: 15 mg/dL (ref 0–40)

## 2024-02-25 LAB — CBC WITH DIFFERENTIAL/PLATELET
Abs Immature Granulocytes: 0.05 K/uL (ref 0.00–0.07)
Basophils Absolute: 0 K/uL (ref 0.0–0.1)
Basophils Relative: 0 %
Eosinophils Absolute: 0.6 K/uL — ABNORMAL HIGH (ref 0.0–0.5)
Eosinophils Relative: 6 %
HCT: 31.9 % — ABNORMAL LOW (ref 36.0–46.0)
Hemoglobin: 10 g/dL — ABNORMAL LOW (ref 12.0–15.0)
Immature Granulocytes: 1 %
Lymphocytes Relative: 24 %
Lymphs Abs: 2.6 K/uL (ref 0.7–4.0)
MCH: 28.1 pg (ref 26.0–34.0)
MCHC: 31.3 g/dL (ref 30.0–36.0)
MCV: 89.6 fL (ref 80.0–100.0)
Monocytes Absolute: 1.2 K/uL — ABNORMAL HIGH (ref 0.1–1.0)
Monocytes Relative: 11 %
Neutro Abs: 6.4 K/uL (ref 1.7–7.7)
Neutrophils Relative %: 58 %
Platelets: 216 K/uL (ref 150–400)
RBC: 3.56 MIL/uL — ABNORMAL LOW (ref 3.87–5.11)
RDW: 15.1 % (ref 11.5–15.5)
WBC: 10.9 K/uL — ABNORMAL HIGH (ref 4.0–10.5)
nRBC: 0 % (ref 0.0–0.2)

## 2024-02-25 LAB — BASIC METABOLIC PANEL WITH GFR
Anion gap: 8 (ref 5–15)
BUN: 23 mg/dL — ABNORMAL HIGH (ref 6–20)
CO2: 30 mmol/L (ref 22–32)
Calcium: 9 mg/dL (ref 8.9–10.3)
Chloride: 100 mmol/L (ref 98–111)
Creatinine, Ser: 1.03 mg/dL — ABNORMAL HIGH (ref 0.44–1.00)
GFR, Estimated: >60 mL/min (ref >60)
Glucose, Bld: 108 mg/dL — ABNORMAL HIGH (ref 70–99)
Potassium: 4.2 mmol/L (ref 3.5–5.1)
Sodium: 138 mmol/L (ref 135–145)

## 2024-02-25 LAB — GLUCOSE, CAPILLARY
Glucose-Capillary: 120 mg/dL — ABNORMAL HIGH (ref 70–99)
Glucose-Capillary: 203 mg/dL — ABNORMAL HIGH (ref 70–99)
Glucose-Capillary: 217 mg/dL — ABNORMAL HIGH (ref 70–99)
Glucose-Capillary: 105 mg/dL — ABNORMAL HIGH (ref 70–99)

## 2024-02-25 LAB — PROCALCITONIN: Procalcitonin: <0.1 ng/mL

## 2024-02-25 NOTE — Progress Notes (Signed)
 Mobility Specialist - Progress Note   02/25/24 0900  Mobility  Activity Ambulated with assistance in room;Stood at bedside  Level of Assistance +2 (takes two people)  Press photographer wheel walker  Distance Ambulated (ft) 4 ft  Range of Motion/Exercises Active  Activity Response Tolerated well  Mobility Referral Yes  Mobility visit 1 Mobility  Mobility Specialist Start Time (ACUTE ONLY) 0900  Mobility Specialist Stop Time (ACUTE ONLY) [id number]  Mobility Specialist Time Calculation (min) (ACUTE ONLY) 17 min   RN requested assistance to mobilize pt. Pt able to stand and take a couple steps forward and back to EOB. At EOS was left in bed with all needs met. Call bell in reach and RN in room.  Erminio Leos,  Mobility Specialist Can be reached via Secure Chat

## 2024-02-25 NOTE — Progress Notes (Signed)
 Mobility Specialist - Progress Note   02/25/24 1453  Mobility  Activity Transferred from chair to bed  Level of Assistance +2 (takes two people)  Press photographer wheel walker  Range of Motion/Exercises Active Assistive  Activity Response Tolerated well  Mobility Referral Yes  Mobility visit 1 Mobility  Mobility Specialist Start Time (ACUTE ONLY) 1443  Mobility Specialist Stop Time (ACUTE ONLY) 1453  Mobility Specialist Time Calculation (min) (ACUTE ONLY) 10 min   RN requested assistance to transfer pt back to bed. Was left with all needs met. Call bell in reach.  Erminio Leos,  Mobility Specialist Can be reached via Secure Chat

## 2024-02-25 NOTE — Progress Notes (Signed)
 [facility]                                                                               [name], is a 53 y.o. female, DOB - [birth year], FMW:[id number] Admit date - 02/14/2024    Outpatient Primary MD for the patient is [name] Madelin Patch, MD  LOS - 11  days    Brief summary    53 y.o. female with medical history significant for chronic HFpEF, history of VTE on Eliquis , insulin -dependent T2DM, SLE, Sjogren's syndrome, HTN, gastroparesis, hypothyroidism, anemia, chronic back pain, morbid obesity who presented to the ED for evaluation of altered mental status  Patient recently admitted at [facility] from 6/30-7/8 for intractable nausea and vomiting related to gastroparesis. She had associated AKI. She was managed with antiemetics with improvement in nausea  and vomiting.   Assessment & Plan    Assessment and Plan:  Acute encephalopathy Resolved.  Suspect from polypharmacy.  CT head negative.  Therapy eval recommending SNF. But patient wanted to go home.    New onset systolic CHF/ Acute HRrEF/ elevated troponins Cardiology on board.  ON torsemide  20 mg daily,  entresto , metoprolol  succinate 25 mg daily  and spironolactone  25 mg dialy,   Echo this admission shows  showed EF of 35 to 40% suspect stress cardiomyopathy.  Limited echocardiogram showed improvement in the LVEF, likely stress cardiomyopathy.  Cardiology defer inpatient cath. Recommend outpatient follow up with cardiology for further work up.  No change in medications.    Hypokalemia Replaced. Repeat values wnl.   SIRS/ Pressure injury to the left upper buttocks From Pressure injury to left upper inner buttocks unstageable  She was initially started on broad spectrum IV antibiotics and narrowed to oral doxycycline . Complete the course of doxycycline .  Afebrile, with improvement in wbc count. Procalcitonin is negative.  She also received 3 days of IV cefepime  for e coli in the urine.   Wound care consulted for buttocks wound.  She also has a foley catheter to prevent soiling of the pressure injury from urine and stool. She wants to keep it until discharge. Please do a voiding trial on Monday prior to discharge.  Patient requested for outpatient wound care center follow up .  She reports having a HHA for 4 hours for 7 days a week as she is on disability.  She wanted her HHA to come to the hospital and see how the wound dressings are done , so they can be done at home on discharge.  The earliest the HHA can come is on Monday.    AKI - creatinine on admission is 2, improved with diuresis.  US  rena is negative for hydronephrosis.  Creatinine around 1 and stable.    H/o DVT On eliquis .   Body mass index is 49.29 kg/m. Obesity class III Recommend outpatient follow up with bariatric sugery.    Insulin  dependent type 2 DM, uncontrolled.  CBG (last 3)  Recent Labs    02/24/24 1651 02/24/24 2022 02/25/24 0720  GLUCAP 122* 109* 120*   Increase semglee  to 30 units bid. Added novolog  5 units tidac.  Change to resistant SSI.  A1c 7.3  Hypothyroidism Resume synthroid .    Chronic normocytic anemia Hemoglobin stable.    SLE/ Sjogren's syndrome  Nausea , no vomiting. Suspect from gastroparesis.  Will start her on erythromycin  to see if that will help.  Unfortunately she cannot tolerate reglan .  IV PPI added to the regimen.  Poor oral intake from nausea CT abd and pelvis is unremarkable.  She reports her nausea is at bay with oral erythromycin  for now.   Vaginal bleeding Unclear if menstrual bleeding? US  PEVIS transabdominal ultrasound did not show any acute findings. Bilateral ovaries not discretely visualized. Tranvaginal ultrasound couldn't be done due to discomfort.  CT abd and pelvis done with contrast done did not show any acute intra-abdominal or pelvic pathology.   Estimated body mass index is 49.29 kg/m as calculated from the following:    Height as of this encounter: 5' 9 (1.753 m).   Weight as of this encounter: 151.4 kg.  Code Status: full code.  DVT Prophylaxis:  apixaban  (ELIQUIS ) tablet 2.5 mg Start: 02/16/24 1700 apixaban  (ELIQUIS ) tablet 2.5 mg   Level of Care: Level of care: Progressive Family Communication: none at bedside.   Disposition Plan:     Remains inpatient appropriate: waiting for her HHA to come and see the wound dressings on Monday , and she can be discharged after that.   Procedures:  None.   Consultants:   Cardiology.   Antimicrobials:   Anti-infectives (From admission, onward)    Start     Dose/Rate Route Frequency Ordered Stop   02/20/24 1715  erythromycin  (E-MYCIN ) tablet 250 mg        250 mg Oral 3 times daily with meals & bedtime 02/20/24 1625     02/19/24 1330  doxycycline  (VIBRA -TABS) tablet 100 mg        100 mg Oral Every 12 hours 02/19/24 1232     02/18/24 1000  vancomycin  (VANCOREADY) IVPB 1250 mg/250 mL  Status:  Discontinued        1,250 mg 166.7 mL/hr over 90 Minutes Intravenous Every 12 hours 02/18/24 0849 02/18/24 1320   02/17/24 2000  vancomycin  (VANCOREADY) IVPB 1500 mg/300 mL  Status:  Discontinued        1,500 mg 150 mL/hr over 120 Minutes Intravenous Every 12 hours 02/17/24 1101 02/18/24 0849   02/16/24 2000  vancomycin  (VANCOREADY) IVPB 1250 mg/250 mL  Status:  Discontinued        1,250 mg 166.7 mL/hr over 90 Minutes Intravenous Every 12 hours 02/16/24 0932 02/17/24 1101   02/15/24 2200  vancomycin  (VANCOCIN ) IVPB 1000 mg/200 mL premix  Status:  Discontinued        1,000 mg 200 mL/hr over 60 Minutes Intravenous Every 24 hours 02/14/24 2345 02/15/24 0958   02/15/24 2200  vancomycin  (VANCOREADY) IVPB 1750 mg/350 mL  Status:  Discontinued        1,750 mg 175 mL/hr over 120 Minutes Intravenous Every 24 hours 02/15/24 0958 02/16/24 0932   02/15/24 1000  ceFEPIme  (MAXIPIME ) 2 g in sodium chloride  0.9 % 100 mL IVPB  Status:  Discontinued        2 g 200 mL/hr over 30  Minutes Intravenous Every 8 hours 02/15/24 0958 02/19/24 1232   02/15/24 0900  ceFEPIme  (MAXIPIME ) 2 g in sodium chloride  0.9 % 100 mL IVPB  Status:  Discontinued        2 g 200 mL/hr over 30 Minutes Intravenous Every 12 hours 02/14/24 2345 02/15/24 0958   02/14/24 2345  metroNIDAZOLE  (FLAGYL ) IVPB 500  mg  Status:  Discontinued        500 mg 100 mL/hr over 60 Minutes Intravenous 2 times daily 02/14/24 2334 02/19/24 1232   02/14/24 2115  vancomycin  (VANCOREADY) IVPB 2000 mg/400 mL        2,000 mg 200 mL/hr over 120 Minutes Intravenous  Once 02/14/24 2110 02/15/24 0238   02/14/24 2115  ceFEPIme  (MAXIPIME ) 2 g in sodium chloride  0.9 % 100 mL IVPB        2 g 200 mL/hr over 30 Minutes Intravenous  Once 02/14/24 2110 02/14/24 2235        Medications  Scheduled Meds:  apixaban   2.5 mg Oral BID   Chlorhexidine  Gluconate Cloth  6 each Topical Daily   collagenase    Topical Daily   doxycycline   100 mg Oral Q12H   erythromycin   250 mg Oral TID WC & HS   feeding supplement  237 mL Oral BID BM   insulin  aspart  0-20 Units Subcutaneous TID WC   insulin  aspart  0-5 Units Subcutaneous QHS   insulin  aspart  5 Units Subcutaneous TID WC   insulin  glargine-yfgn  30 Units Subcutaneous BID   levothyroxine   50 mcg Oral Q0600   metoprolol  succinate  25 mg Oral Daily   pantoprazole   40 mg Oral Daily   polyethylene glycol  17 g Oral Daily   rosuvastatin   20 mg Oral Daily   sacubitril -valsartan   1 tablet Oral BID   senna-docusate  2 tablet Oral BID   sodium chloride  flush  10-40 mL Intracatheter Q12H   sodium chloride  flush  3 mL Intravenous Q12H   spironolactone   25 mg Oral Daily   torsemide   20 mg Oral Daily   Continuous Infusions: PRN Meds:.acetaminophen  **OR** acetaminophen , diphenhydrAMINE , morphine  injection, mouth rinse, prochlorperazine , sodium chloride  flush, traMADol     Subjective:   Himani Corona was seen and examined today. No new complaints today.    Objective:   Vitals:    02/24/24 1712 02/24/24 1918 02/25/24 0410 02/25/24 0414  BP: (!) 98/57 100/60 (!) 107/58   Pulse: 95 (!) 101 99   Resp: 20 20 18    Temp: 98.7 F (37.1 C) 99 F (37.2 C) 99.1 F (37.3 C)   TempSrc: Oral Oral Oral   SpO2: 100% 100% 97%   Weight:    (!) 151.4 kg  Height:        Intake/Output Summary (Last 24 hours) at 02/25/2024 0823 Last data filed at 02/25/2024 0413 Gross per 24 hour  Intake 340 ml  Output 1700 ml  Net -1360 ml   Filed Weights   02/23/24 0500 02/24/24 0332 02/25/24 0414  Weight: (!) 158.1 kg (!) 153 kg (!) 151.4 kg     Exam General exam: Appears calm and comfortable  Respiratory system: Clear to auscultation. Respiratory effort normal. Cardiovascular system: S1 & S2 heard, RRR. No JVD, Gastrointestinal system: Abdomen is nondistended, soft and nontender.  Central nervous system: Alert and oriented.  Extremities: no cyanosis Skin: pressure injury on the inner buttocks.  Psychiatry: Mood & affect appropriate.         Data Reviewed:  I have personally reviewed following labs and imaging studies   CBC Lab Results  Component Value Date   WBC 10.9 (H) 02/25/2024   RBC 3.56 (L) 02/25/2024   HGB 10.0 (L) 02/25/2024   HCT 31.9 (L) 02/25/2024   MCV 89.6 02/25/2024   MCH 28.1 02/25/2024   PLT 216 02/25/2024   MCHC 31.3 02/25/2024  RDW 15.1 02/25/2024   LYMPHSABS 2.6 02/25/2024   MONOABS 1.2 (H) 02/25/2024   EOSABS 0.6 (H) 02/25/2024   BASOSABS 0.0 02/25/2024     Last metabolic panel Lab Results  Component Value Date   NA 138 02/25/2024   K 4.2 02/25/2024   CL 100 02/25/2024   CO2 30 02/25/2024   BUN 23 (H) 02/25/2024   CREATININE 1.03 (H) 02/25/2024   GLUCOSE 108 (H) 02/25/2024   GFRNONAA >60 02/25/2024   GFRAA >60 02/07/2020   CALCIUM  9.0 02/25/2024   PHOS 4.6 11/17/2022   PROT 5.2 (L) 02/16/2024   ALBUMIN  2.4 (L) 02/16/2024   LABGLOB 2.5 12/30/2021   BILITOT 1.3 (H) 02/16/2024   ALKPHOS 53 02/16/2024   AST 18 02/16/2024   ALT  13 02/16/2024   ANIONGAP 8 02/25/2024    CBG (last 3)  Recent Labs    02/24/24 1651 02/24/24 2022 02/25/24 0720  GLUCAP 122* 109* 120*      Coagulation Profile: No results for input(s): INR, PROTIME in the last 168 hours.    Radiology Studies: ECHOCARDIOGRAM LIMITED Result Date: 02/24/2024    ECHOCARDIOGRAM LIMITED REPORT   Patient Name:   KRITI KATAYAMA Date of Exam: 02/24/2024 Medical Rec #:  [id number]     Height:       69.0 in Accession #:    [id number]    Weight:       337.3 lb Date of Birth:  28-Feb-1971     BSA:          2.580 m Patient Age:    53 years      BP:           101/54 mmHg Patient Gender: F             HR:           90 bpm. Exam Location:  Inpatient Procedure: Limited Echo, Cardiac Doppler, Color Doppler and Intracardiac            Opacification Agent (Both Spectral and Color Flow Doppler were            utilized during procedure). Indications:    I50.40* Unspecified combined systolic (congestive) and diastolic                 (congestive) heart failure  History:        Patient has prior history of Echocardiogram examinations, most                 recent 02/15/2024. CHF, Previous Myocardial Infarction, Stroke,                 Signs/Symptoms:Altered Mental Status and Bacteremia; Risk                 Factors:Hypertension.  Sonographer:    Ellouise Mose RDCS Referring Phys: [id number] SOYLA DELENA MERCK  Sonographer Comments: Technically difficult study due to poor echo windows and patient is obese. Image acquisition challenging due to patient body habitus. Limited study for LV function and wall motion. IMPRESSIONS  1. Left ventricular ejection fraction, by estimation, is 65 to 70%. The left ventricle has normal function. The left ventricle has no regional wall motion abnormalities. There is mild asymmetric left ventricular hypertrophy of the infero-lateral segment.  2. Right ventricular systolic function is normal. The right ventricular size is normal. There is normal pulmonary artery  systolic pressure.  3. A small pericardial effusion is present. There is no evidence of cardiac tamponade.  4. The  mitral valve is grossly normal. Trivial mitral valve regurgitation.  5. The aortic valve is tricuspid. Aortic valve regurgitation is not visualized.  6. The inferior vena cava is normal in size with greater than 50% respiratory variability, suggesting right atrial pressure of 3 mmHg. Comparison(s): The left ventricular function has improved. The left ventricular wall motion abnormalities are improved. FINDINGS  Left Ventricle: Left ventricular ejection fraction, by estimation, is 65 to 70%. The left ventricle has normal function. The left ventricle has no regional wall motion abnormalities. Definity  contrast agent was given IV to delineate the left ventricular  endocardial borders. There is mild asymmetric left ventricular hypertrophy of the infero-lateral segment. Right Ventricle: The right ventricular size is normal. Right vetricular wall thickness was not well visualized. Right ventricular systolic function is normal. There is normal pulmonary artery systolic pressure. The tricuspid regurgitant velocity is 2.32 m/s, and with an assumed right atrial pressure of 3 mmHg, the estimated right ventricular systolic pressure is 24.5 mmHg. Pericardium: A small pericardial effusion is present. There is no evidence of cardiac tamponade. Mitral Valve: The mitral valve is grossly normal. Trivial mitral valve regurgitation. Tricuspid Valve: The tricuspid valve is grossly normal. Tricuspid valve regurgitation is mild . No evidence of tricuspid stenosis. Aortic Valve: The aortic valve is tricuspid. Aortic valve regurgitation is not visualized. Aorta: The aortic root and ascending aorta are structurally normal, with no evidence of dilitation. Venous: The inferior vena cava is normal in size with greater than 50% respiratory variability, suggesting right atrial pressure of 3 mmHg. Additional Comments: Spectral Doppler  performed. Color Doppler performed.  LEFT VENTRICLE PLAX 2D LVIDd:         5.60 cm LVIDs:         3.35 cm LV PW:         1.25 cm LV IVS:        0.95 cm  LV Volumes (MOD) LV vol d, MOD A2C: 127.0 ml LV vol d, MOD A4C: 103.0 ml LV vol s, MOD A2C: 40.6 ml LV vol s, MOD A4C: 29.0 ml LV SV MOD A2C:     86.4 ml LV SV MOD A4C:     103.0 ml LV SV MOD BP:      80.0 ml IVC IVC diam: 1.60 cm  AORTA Ao Asc diam: 3.20 cm TRICUSPID VALVE TR Peak grad:   21.5 mmHg TR Vmax:        232.00 cm/s Soyla Merck MD Electronically signed by Soyla Merck MD Signature Date/Time: 02/24/2024/10:07:51 AM    Final    CT ABDOMEN PELVIS W CONTRAST Result Date: 02/23/2024 CLINICAL DATA:  Abdominal pain. EXAM: CT ABDOMEN AND PELVIS WITH CONTRAST TECHNIQUE: Multidetector CT imaging of the abdomen and pelvis was performed using the standard protocol following bolus administration of intravenous contrast. RADIATION DOSE REDUCTION: This exam was performed according to the departmental dose-optimization program which includes automated exposure control, adjustment of the mA and/or kV according to patient size and/or use of iterative reconstruction technique. CONTRAST:  OMNIPAQUE  IOHEXOL  300 MG/ML  SOLN COMPARISON:  CT abdomen pelvis dated 01/25/2024. FINDINGS: Lower chest: The visualized lung bases are clear. No intra-abdominal free air or free fluid. Hepatobiliary: Faint ill-defined 12 mm hypodense lesion in the right lobe of the liver is not visualized on the delayed images. This can be better evaluated with ultrasound on an outpatient/nonemergent basis. No biliary ductal dilatation. The gallbladder is unremarkable. Pancreas: Unremarkable. No pancreatic ductal dilatation or surrounding inflammatory changes. Spleen: Normal in size without  focal abnormality. Adrenals/Urinary Tract: The adrenal glands are unremarkable. There is a 3 mm nonobstructing right renal upper pole calculus. No hydronephrosis. The left kidney is unremarkable. There is  symmetric enhancement and excretion of contrast by both kidneys. The visualized ureters appear unremarkable. The urinary bladder is decompressed around a Foley catheter. Stomach/Bowel: There is no bowel obstruction or active inflammation. The appendix is normal. Vascular/Lymphatic: Mild atherosclerotic calcification of the abdominal aorta. The IVC is unremarkable. No portal venous gas. There is no adenopathy. Reproductive: The uterus and ovaries are grossly unremarkable. No suspicious adnexal masses. Other: None Musculoskeletal: No acute or significant osseous findings. IMPRESSION: 1. No acute intra-abdominal or pelvic pathology. 2. A 3 mm nonobstructing right renal upper pole calculus. No hydronephrosis. 3.  Aortic Atherosclerosis (ICD10-I70.0). Electronically Signed   By: Vanetta Chou M.D.   On: 02/23/2024 10:45       Elgie Butter M.D. [facility] 02/25/2024, 8:23 AM  Available via Epic secure chat 7am-7pm After 7 pm, please refer to night coverage provider listed on amion.

## 2024-02-25 NOTE — Progress Notes (Signed)
 Mobility Specialist - Progress Note   02/25/24 1303  Mobility  Activity Transferred from bed to chair  Level of Assistance +2 (takes two people)  Musician  Range of Motion/Exercises Active Assistive  Activity Response Tolerated well  Mobility Referral Yes  Mobility visit 1 Mobility  Mobility Specialist Start Time (ACUTE ONLY) 1250  Mobility Specialist Stop Time (ACUTE ONLY) 1303  Mobility Specialist Time Calculation (min) (ACUTE ONLY) 13 min   RN requested assistance to mobilize pt. Pt able to stand and pivot to recliner chair. Was left with all needs met. Call bell in reach.   Erminio Leos,  Mobility Specialist Can be reached via Secure Chat

## 2024-02-25 NOTE — Plan of Care (Signed)
 Cont to mobilize pt

## 2024-02-25 NOTE — Progress Notes (Signed)
 Pt oob x 2 today, She was able to take 6 steps today. Also OOB in the chair for two hours.

## 2024-02-26 DIAGNOSIS — E785 Hyperlipidemia, unspecified: Secondary | ICD-10-CM

## 2024-02-26 DIAGNOSIS — E1169 Type 2 diabetes mellitus with other specified complication: Secondary | ICD-10-CM

## 2024-02-26 LAB — CBC WITH DIFFERENTIAL/PLATELET
Abs Immature Granulocytes: 0.03 K/uL (ref 0.00–0.07)
Basophils Absolute: 0.1 K/uL (ref 0.0–0.1)
Basophils Relative: 0 %
Eosinophils Absolute: 0.6 K/uL — ABNORMAL HIGH (ref 0.0–0.5)
Eosinophils Relative: 5 %
HCT: 31.9 % — ABNORMAL LOW (ref 36.0–46.0)
Hemoglobin: 10 g/dL — ABNORMAL LOW (ref 12.0–15.0)
Immature Granulocytes: 0 %
Lymphocytes Relative: 27 %
Lymphs Abs: 3.2 K/uL (ref 0.7–4.0)
MCH: 28.9 pg (ref 26.0–34.0)
MCHC: 31.3 g/dL (ref 30.0–36.0)
MCV: 92.2 fL (ref 80.0–100.0)
Monocytes Absolute: 1.1 K/uL — ABNORMAL HIGH (ref 0.1–1.0)
Monocytes Relative: 9 %
Neutro Abs: 6.9 K/uL (ref 1.7–7.7)
Neutrophils Relative %: 59 %
Platelets: 241 K/uL (ref 150–400)
RBC: 3.46 MIL/uL — ABNORMAL LOW (ref 3.87–5.11)
RDW: 15 % (ref 11.5–15.5)
WBC: 11.8 K/uL — ABNORMAL HIGH (ref 4.0–10.5)
nRBC: 0 % (ref 0.0–0.2)

## 2024-02-26 LAB — BASIC METABOLIC PANEL WITH GFR
Anion gap: 10 (ref 5–15)
BUN: 21 mg/dL — ABNORMAL HIGH (ref 6–20)
CO2: 29 mmol/L (ref 22–32)
Calcium: 8.9 mg/dL (ref 8.9–10.3)
Chloride: 99 mmol/L (ref 98–111)
Creatinine, Ser: 1.08 mg/dL — ABNORMAL HIGH (ref 0.44–1.00)
GFR, Estimated: >60 mL/min (ref >60)
Glucose, Bld: 86 mg/dL (ref 70–99)
Potassium: 3.9 mmol/L (ref 3.5–5.1)
Sodium: 138 mmol/L (ref 135–145)

## 2024-02-26 LAB — GLUCOSE, CAPILLARY
Glucose-Capillary: 93 mg/dL (ref 70–99)
Glucose-Capillary: 108 mg/dL — ABNORMAL HIGH (ref 70–99)
Glucose-Capillary: 188 mg/dL — ABNORMAL HIGH (ref 70–99)
Glucose-Capillary: 156 mg/dL — ABNORMAL HIGH (ref 70–99)

## 2024-02-26 NOTE — Progress Notes (Signed)
 [facility]  PROGRESS NOTE  [name] FMW:[id number] DOB: [birth year] DOA: 02/14/2024 PCP: [name], MD   Brief HPI:   53 y.o. female with medical history significant for chronic HFpEF, history of VTE on Eliquis , insulin -dependent T2DM, SLE, Sjogren's syndrome, HTN, gastroparesis, hypothyroidism, anemia, chronic back pain, morbid obesity who presented to the ED for evaluation of altered mental status  Patient recently admitted at [facility] from 6/30-7/8 for intractable nausea and vomiting related to gastroparesis. She had associated AKI. She was managed with antiemetics with improvement in nausea  and vomiting.    Assessment/Plan:   Acute encephalopathy Resolved.  Suspect from polypharmacy.  CT head negative.  Therapy eval recommending SNF. But patient wanted to go home.      New onset systolic CHF/ Acute HRrEF/ elevated troponins Cardiology was consulted, ON torsemide  20 mg daily,  entresto , metoprolol  succinate 25 mg daily  and spironolactone  25 mg dialy,   Echo this admission shows  showed EF of 35 to 40% suspect stress cardiomyopathy.  Limited echocardiogram showed improvement in the LVEF, likely stress cardiomyopathy.  Cardiology defer inpatient cath. Recommend outpatient follow up with cardiology for further work up.  No change in medications.      Hypokalemia Replete   SIRS/ Pressure injury to the left upper buttocks From Pressure injury to left upper inner buttocks unstageable  She was initially started on broad spectrum IV antibiotics and narrowed to oral doxycycline . Complete the course of doxycycline .  Afebrile, with improvement in wbc count. Procalcitonin is negative.  She also received 3 days of IV cefepime  for e coli in the urine.  Wound care consulted for buttocks wound.  She also has a foley catheter to prevent soiling of the pressure injury from urine and stool. She wants to keep it until discharge.  Patient requested for  outpatient wound care center follow up .  -No home health agency accepted patient Patient's caregiver will be instructed on dressing changes Likely discharge home in a.m.  Diabetes mellitus type 2 - Continue Lantus , sliding scale insulin  with NovoLog  - CBG well-controlled      AKI - creatinine on admission is 2, improved with diuresis.  US  renal is negative for hydronephrosis.  Creatinine around 1 and stable.      H/o DVT On eliquis .    Body mass index is 49.29 kg/m. Obesity class III Recommend outpatient follow up with bariatric sugery.         Medications     apixaban   2.5 mg Oral BID   Chlorhexidine  Gluconate Cloth  6 each Topical Daily   collagenase    Topical Daily   doxycycline   100 mg Oral Q12H   erythromycin   250 mg Oral TID WC & HS   feeding supplement  237 mL Oral BID BM   insulin  aspart  0-20 Units Subcutaneous TID WC   insulin  aspart  0-5 Units Subcutaneous QHS   insulin  aspart  5 Units Subcutaneous TID WC   insulin  glargine-yfgn  30 Units Subcutaneous BID   levothyroxine   50 mcg Oral Q0600   metoprolol  succinate  25 mg Oral Daily   pantoprazole   40 mg Oral Daily   polyethylene glycol  17 g Oral Daily   rosuvastatin   20 mg Oral Daily   sacubitril -valsartan   1 tablet Oral BID   senna-docusate  2 tablet Oral BID   sodium chloride  flush  10-40 mL Intracatheter Q12H   sodium chloride  flush  3 mL Intravenous Q12H   spironolactone   25 mg Oral Daily   torsemide   20 mg Oral Daily     Data Reviewed:   CBG:  Recent Labs  Lab 02/25/24 0720 02/25/24 1130 02/25/24 1607 02/25/24 2142 02/26/24 0729  GLUCAP 120* 203* 217* 105* 93    SpO2: 98 %    Vitals:   02/25/24 1135 02/25/24 2150 02/26/24 0424 02/26/24 0500  BP: (!) 100/53 (!) 97/58 114/66   Pulse: 93 91 90   Resp: 19 16 16    Temp: 98.3 F (36.8 C) 98.6 F (37 C) 98 F (36.7 C)   TempSrc: Oral Oral Oral   SpO2: 95% 97% 98%   Weight:    (!) 150.5 kg  Height:          Data  Reviewed:  Basic Metabolic Panel: Recent Labs  Lab 02/20/24 1722 02/21/24 0534 02/22/24 0515 02/23/24 1124 02/24/24 0518 02/24/24 0520 02/25/24 0511 02/26/24 0504  NA  --  137 134* 137  --  138 138 138  K  --  3.8 3.3* 3.5  --  3.3* 4.2 3.9  CL  --  99 97* 98  --  98 100 99  CO2  --  27 28 29   --  29 30 29   GLUCOSE  --  235* 216* 204*  --  139* 108* 86  BUN  --  15 19 20   --  23* 23* 21*  CREATININE  --  1.03* 1.06* 0.91  --  1.12* 1.03* 1.08*  CALCIUM   --  8.7* 8.6* 8.8*  --  8.8* 9.0 8.9  MG 1.4* 2.3  --   --  1.6*  --   --   --     CBC: Recent Labs  Lab 02/21/24 0534 02/23/24 1124 02/24/24 0520 02/25/24 0511 02/26/24 0504  WBC 13.1* 12.3* 12.5* 10.9* 11.8*  NEUTROABS 7.9* 8.0* 7.6 6.4 6.9  HGB 10.4* 10.2* 9.7* 10.0* 10.0*  HCT 33.5* 32.6* 30.8* 31.9* 31.9*  MCV 93.6 92.4 91.4 89.6 92.2  PLT 236 223 223 216 241    LFT No results for input(s): AST, ALT, ALKPHOS, BILITOT, PROT, ALBUMIN  in the last 168 hours.   Antibiotics: Anti-infectives (From admission, onward)    Start     Dose/Rate Route Frequency Ordered Stop   02/20/24 1715  erythromycin  (E-MYCIN ) tablet 250 mg        250 mg Oral 3 times daily with meals & bedtime 02/20/24 1625     02/19/24 1330  doxycycline  (VIBRA -TABS) tablet 100 mg        100 mg Oral Every 12 hours 02/19/24 1232     02/18/24 1000  vancomycin  (VANCOREADY) IVPB 1250 mg/250 mL  Status:  Discontinued        1,250 mg 166.7 mL/hr over 90 Minutes Intravenous Every 12 hours 02/18/24 0849 02/18/24 1320   02/17/24 2000  vancomycin  (VANCOREADY) IVPB 1500 mg/300 mL  Status:  Discontinued        1,500 mg 150 mL/hr over 120 Minutes Intravenous Every 12 hours 02/17/24 1101 02/18/24 0849   02/16/24 2000  vancomycin  (VANCOREADY) IVPB 1250 mg/250 mL  Status:  Discontinued        1,250 mg 166.7 mL/hr over 90 Minutes Intravenous Every 12 hours 02/16/24 0932 02/17/24 1101   02/15/24 2200  vancomycin  (VANCOCIN ) IVPB 1000 mg/200 mL premix   Status:  Discontinued        1,000 mg 200 mL/hr over 60 Minutes Intravenous Every 24 hours 02/14/24 2345 02/15/24 0958   02/15/24 2200  vancomycin  (VANCOREADY) IVPB 1750 mg/350 mL  Status:  Discontinued        1,750 mg 175 mL/hr over 120 Minutes Intravenous Every 24 hours 02/15/24 0958 02/16/24 0932   02/15/24 1000  ceFEPIme  (MAXIPIME ) 2 g in sodium chloride  0.9 % 100 mL IVPB  Status:  Discontinued        2 g 200 mL/hr over 30 Minutes Intravenous Every 8 hours 02/15/24 0958 02/19/24 1232   02/15/24 0900  ceFEPIme  (MAXIPIME ) 2 g in sodium chloride  0.9 % 100 mL IVPB  Status:  Discontinued        2 g 200 mL/hr over 30 Minutes Intravenous Every 12 hours 02/14/24 2345 02/15/24 0958   02/14/24 2345  metroNIDAZOLE  (FLAGYL ) IVPB 500 mg  Status:  Discontinued        500 mg 100 mL/hr over 60 Minutes Intravenous 2 times daily 02/14/24 2334 02/19/24 1232   02/14/24 2115  vancomycin  (VANCOREADY) IVPB 2000 mg/400 mL        2,000 mg 200 mL/hr over 120 Minutes Intravenous  Once 02/14/24 2110 02/15/24 0238   02/14/24 2115  ceFEPIme  (MAXIPIME ) 2 g in sodium chloride  0.9 % 100 mL IVPB        2 g 200 mL/hr over 30 Minutes Intravenous  Once 02/14/24 2110 02/14/24 2235        DVT prophylaxis: Apixaban   Code Status: Full code  Family Communication: No family at bedside   CONSULTS    Subjective   Denies any complaints.   Objective    Physical Examination:  General-appears in no acute distress Heart-S1-S2, regular, no murmur auscultated Lungs-clear to auscultation bilaterally, no wheezing or crackles auscultated Abdomen-soft, nontender, no organomegaly Extremities-no edema in the lower extremities Neuro-alert, oriented x3, no focal deficit noted   Status is: Inpatient:             [name]   [facility] If 7PM-7AM, please contact night-coverage at [email or web address], Office  [phone]   02/26/2024, 7:50 AM  LOS: 12 days

## 2024-02-26 NOTE — Progress Notes (Signed)
 Physical Therapy Treatment Patient Details Name: [name] MRN: [id number] DOB: April 08, 1971 Today's Date: 02/26/2024   History of Present Illness Pt is 53 yo female admitted on 02/14/24 with acute encephalopathy (likely in setting of polypharmacy), SIRS, buttock wounds, AKI, and CHF.  Pt with hx including but not limited to chronic HFpEF, history of VTE on Eliquis , insulin -dependent T2DM, SLE, Sjogren's syndrome, HTN, gastroparesis, hypothyroidism, anemia, chronic back pain, morbid obesity    PT Comments  Pt needing encouragement to participate in skilled PT, reports fear of falling and nausea, but agreeable with education. Pt needing mod A to come to sitting EOB, HOB elevated and pt using bedrail to assist. Pt powers to stand from elevated bed with mod A+2, bariatric RW blocked from sliding, cues for LE engagement and powering up. Pt tolerates static standing 30 sec and then 60 sec with seated rest break between, pt needing continuous encouragement to maintain static, supported stance. Pt declines transfer to recliner at bedside, +2 to assist to return to supine and scoot up in bed. Pt reports plan to return home despite education regarding +2 assist for STS and limited stance time with support. Cont to recommend continued inpatient follow up therapy, <3 hours/day.     If plan is discharge home, recommend the following: A lot of help with bathing/dressing/bathroom;Help with stairs or ramp for entrance;Assistance with cooking/housework;Two people to help with walking and/or transfers   Can travel by private vehicle     No  Equipment Recommendations  None recommended by PT    Recommendations for Other Services       Precautions / Restrictions Precautions Precautions: Fall Precaution/Restrictions Comments: buttock wounds - painful Restrictions Weight Bearing Restrictions Per Provider Order: No     Mobility  Bed Mobility Overal bed mobility: Needs Assistance Bed Mobility: Supine to  Sit, Sit to Supine     Supine to sit: Mod assist, HOB elevated, Used rails Sit to supine: Mod assist, +2 for physical assistance   General bed mobility comments: mod A to mobilize BLE to EOB with pt pulling on bedrail to assist trunk, therapist also using bedpad to scoot hips to EOB; mod A+2 to return to supine for BLE assistance back into bed, pt holding bedrail to assist trunk; +2 wiht bedpad to scoot up in bed with bedpad and bed assist, pt cued for pushing through BLE and using BUE as able to assist    Transfers Overall transfer level: Needs assistance Equipment used: Rolling walker (2 wheels) (bariatric) Transfers: Sit to/from Stand Sit to Stand: Mod assist, +2 physical assistance, +2 safety/equipment, From elevated surface           General transfer comment: mod A+2 from elevated bed to power to stand from elevated bed, bariatric RW blocked in place, rocking momentum and cues ot power up; pt tolerates static stand for 30 sec and 60 sec, needing encouragement and cues to continue standing as pt reports fatigue and I'm scared with static stance; unable to encourage pt to step pivot to recliner at bedside due to discomfort in sitting at wound sites so assisted pt back to bed    Ambulation/Gait                   Stairs             Wheelchair Mobility     Tilt Bed    Modified Rankin (Stroke Patients Only)       Balance Overall balance assessment: Needs assistance Sitting-balance support:  No upper extremity supported Sitting balance-Leahy Scale: Fair Sitting balance - Comments: sitting EOB wtih single UE support, supv   Standing balance support: Reliant on assistive device for balance, No upper extremity supported Standing balance-Leahy Scale: Poor                              Communication Communication Communication: No apparent difficulties  Cognition Arousal: Alert Behavior During Therapy: Anxious   PT - Cognitive impairments: No  apparent impairments                       PT - Cognition Comments: pt pleasant, oriented to hospital, appears anxious needing max encouragement to mobilize, pt appears to have poor understanding of limitations, reports plan to return home with assistance 4hrs/day despite +2 assist with mobility in hospital Following commands: Intact      Cueing Cueing Techniques: Verbal cues  Exercises      General Comments        Pertinent Vitals/Pain Pain Assessment Pain Assessment: Faces Faces Pain Scale: Hurts even more Pain Location: wounds, abdomen/nausea Pain Descriptors / Indicators: Grimacing, Guarding, Discomfort Pain Intervention(s): Limited activity within patient's tolerance, Monitored during session, Repositioned, Patient requesting pain meds-RN notified    Home Living                          Prior Function            PT Goals (current goals can now be found in the care plan section) Acute Rehab PT Goals Patient Stated Goal: return home PT Goal Formulation: With patient Time For Goal Achievement: 03/04/24 Potential to Achieve Goals: Fair Progress towards PT goals: Progressing toward goals    Frequency    Min 2X/week      PT Plan      Co-evaluation              AM-PAC PT 6 Clicks Mobility   Outcome Measure  Help needed turning from your back to your side while in a flat bed without using bedrails?: A Lot Help needed moving from lying on your back to sitting on the side of a flat bed without using bedrails?: A Lot Help needed moving to and from a bed to a chair (including a wheelchair)?: Total Help needed standing up from a chair using your arms (e.g., wheelchair or bedside chair)?: A Lot Help needed to walk in hospital room?: Total Help needed climbing 3-5 steps with a railing? : Total 6 Click Score: 9    End of Session Equipment Utilized During Treatment: Gait belt Activity Tolerance: Patient limited by fatigue;Patient limited  by pain;Other (comment) (fear of falling, nausea) Patient left: in bed;with call bell/phone within reach Nurse Communication: Mobility status;Patient requests pain meds PT Visit Diagnosis: Other abnormalities of gait and mobility (R26.89);Muscle weakness (generalized) (M62.81)     Time: 9052-8994 PT Time Calculation (min) (ACUTE ONLY): 18 min  Charges:    $Therapeutic Activity: 8-22 mins PT General Charges $$ ACUTE PT VISIT: 1 Visit                     Tori Jerie Basford PT, DPT 02/26/24, 10:31 AM

## 2024-02-26 NOTE — TOC Progression Note (Signed)
 Transition of Care [facility]) - Progression Note    Patient Details  Name: SALINDA SNEDEKER MRN: [id number] Date of Birth: 03-Nov-1970  [facility] CM/SW Contact  Monserrath Junio, Nathanel, RN Phone Number: 02/26/2024, 11:16 AM  Clinical Narrative:   Unfortunately she declined [facility] SNF;we have provided all resources possible. she understands & agrees to d/c tomorrow once her caregiver has been instructed on wound care. Wound care appt set. PTAR @ d/c.    Expected Discharge Plan: Home/Self Care Barriers to Discharge: No Home Care Agency will accept this patient (managed medicaid-[facility])               Expected Discharge Plan and Services   Discharge Planning Services: CM Consult Post Acute Care Choice: Resumption of Svcs/PTA Provider Living arrangements for the past 2 months: Single Family Home                           HH Arranged:  (No HHC agency to accept.)           Social Drivers of Health (SDOH) Interventions SDOH Screenings   Food Insecurity: No Food Insecurity (02/18/2024)  Recent Concern: Food Insecurity - Medium Risk (02/07/2024)   Received from [facility]  Housing: Low Risk  (02/18/2024)  Recent Concern: Housing - High Risk (02/15/2024)  Transportation Needs: No Transportation Needs (02/18/2024)  Utilities: At Risk (02/18/2024)  Financial Resource Strain: Low Risk  (03/30/2022)   Received from [facility], [facility] visits prior to 10/01/2022.  Social Connections: Unknown (12/11/2022)   Received from [facility]  Stress: No Stress Concern Present (01/30/2024)   Received from [facility]  Tobacco Use: Medium Risk (02/20/2024)    Readmission Risk Interventions    02/18/2024    4:43 PM 04/27/2023    3:50 PM  Readmission Risk Prevention Plan  Transportation Screening Complete Complete  PCP or Specialist Appt within 3-5 Days  Complete  HRI or Home Care Consult  Complete  Social Work  Consult for Recovery Care Planning/Counseling  Complete  Palliative Care Screening  Not Applicable  Medication Review [employer]) Complete Referral to Pharmacy  PCP or Specialist appointment within 3-5 days of discharge Complete   HRI or Home Care Consult Complete   SW Recovery Care/Counseling Consult Complete   Palliative Care Screening Not Applicable   Skilled Nursing Facility Not Applicable

## 2024-02-27 DIAGNOSIS — R7989 Other specified abnormal findings of blood chemistry: Secondary | ICD-10-CM | POA: Diagnosis not present

## 2024-02-27 DIAGNOSIS — G9340 Encephalopathy, unspecified: Secondary | ICD-10-CM | POA: Diagnosis not present

## 2024-02-27 DIAGNOSIS — R4182 Altered mental status, unspecified: Secondary | ICD-10-CM | POA: Diagnosis not present

## 2024-02-27 DIAGNOSIS — E039 Hypothyroidism, unspecified: Secondary | ICD-10-CM | POA: Diagnosis not present

## 2024-02-27 LAB — BASIC METABOLIC PANEL WITH GFR
Anion gap: 10 (ref 5–15)
BUN: 20 mg/dL (ref 6–20)
CO2: 29 mmol/L (ref 22–32)
Calcium: 8.9 mg/dL (ref 8.9–10.3)
Chloride: 98 mmol/L (ref 98–111)
Creatinine, Ser: 1.16 mg/dL — ABNORMAL HIGH (ref 0.44–1.00)
GFR, Estimated: 56 mL/min — ABNORMAL LOW (ref >60)
Glucose, Bld: 89 mg/dL (ref 70–99)
Potassium: 3.9 mmol/L (ref 3.5–5.1)
Sodium: 137 mmol/L (ref 135–145)

## 2024-02-27 LAB — CBC WITH DIFFERENTIAL/PLATELET
Abs Immature Granulocytes: 0.03 K/uL (ref 0.00–0.07)
Basophils Absolute: 0 K/uL (ref 0.0–0.1)
Basophils Relative: 0 %
Eosinophils Absolute: 0.5 K/uL (ref 0.0–0.5)
Eosinophils Relative: 5 %
HCT: 31.2 % — ABNORMAL LOW (ref 36.0–46.0)
Hemoglobin: 9.8 g/dL — ABNORMAL LOW (ref 12.0–15.0)
Immature Granulocytes: 0 %
Lymphocytes Relative: 29 %
Lymphs Abs: 3 K/uL (ref 0.7–4.0)
MCH: 28.9 pg (ref 26.0–34.0)
MCHC: 31.4 g/dL (ref 30.0–36.0)
MCV: 92 fL (ref 80.0–100.0)
Monocytes Absolute: 1.2 K/uL — ABNORMAL HIGH (ref 0.1–1.0)
Monocytes Relative: 12 %
Neutro Abs: 5.7 K/uL (ref 1.7–7.7)
Neutrophils Relative %: 54 %
Platelets: 247 K/uL (ref 150–400)
RBC: 3.39 MIL/uL — ABNORMAL LOW (ref 3.87–5.11)
RDW: 14.9 % (ref 11.5–15.5)
WBC: 10.5 K/uL (ref 4.0–10.5)
nRBC: 0 % (ref 0.0–0.2)

## 2024-02-27 LAB — GLUCOSE, CAPILLARY
Glucose-Capillary: 132 mg/dL — ABNORMAL HIGH (ref 70–99)
Glucose-Capillary: 219 mg/dL — ABNORMAL HIGH (ref 70–99)
Glucose-Capillary: 100 mg/dL — ABNORMAL HIGH (ref 70–99)
Glucose-Capillary: 98 mg/dL (ref 70–99)

## 2024-02-27 NOTE — Progress Notes (Signed)
 [facility]  PROGRESS NOTE  GATHA MCNULTY FMW:[id number] DOB: [birth year] DOA: 02/14/2024 PCP: [name], MD   Brief HPI:   53 y.o. female with medical history significant for chronic HFpEF, history of VTE on Eliquis , insulin -dependent T2DM, SLE, Sjogren's syndrome, HTN, gastroparesis, hypothyroidism, anemia, chronic back pain, morbid obesity who presented to the ED for evaluation of altered mental status  Patient recently admitted at [facility] from 6/30-7/8 for intractable nausea and vomiting related to gastroparesis. She had associated AKI. She was managed with antiemetics with improvement in nausea  and vomiting.    Assessment/Plan:   Acute encephalopathy Resolved.  Suspect from polypharmacy.  CT head negative.  Therapy eval recommending SNF. But patient wants to go home.     New onset systolic CHF/ Acute HRrEF/ elevated troponins Cardiology was consulted, ON torsemide  20 mg daily,  entresto , metoprolol  succinate 25 mg daily  and spironolactone  25 mg dialy,   Echo this admission shows  showed EF of 35 to 40% suspect stress cardiomyopathy.  Limited echocardiogram showed improvement in the LVEF, likely stress cardiomyopathy.  Cardiology defer inpatient cath. Recommend outpatient follow up with cardiology for further work up.  No change in medications.      Hypokalemia Replete   SIRS/ Pressure injury to the left upper buttocks From Pressure injury to left upper inner buttocks unstageable  She was initially started on broad spectrum IV antibiotics and narrowed to oral doxycycline . Complete the course of doxycycline .  Afebrile, with improvement in wbc count. Procalcitonin is negative.  She also received 3 days of IV cefepime  for e coli in the urine.  Wound care consulted for buttocks wound.  She also has a foley catheter to prevent soiling of the pressure injury from urine and stool. She wants to keep it until discharge.  Patient requested for  outpatient wound care center follow up .  -No home health agency accepted patient Patient's caregiver will be instructed on dressing changes Likely discharge home in a.m. Awaiting equipment to be delivered-to home patient  Diabetes mellitus type 2 - Continue Lantus , sliding scale insulin  with NovoLog  - CBG well-controlled      AKI - creatinine on admission is 2, improved with diuresis.  US  renal is negative for hydronephrosis.  Creatinine around 1 and stable.      H/o DVT On eliquis .    Body mass index is 49.29 kg/m. Obesity class III Recommend outpatient follow up with bariatric sugery.         Medications     apixaban   2.5 mg Oral BID   Chlorhexidine  Gluconate Cloth  6 each Topical Daily   collagenase    Topical Daily   erythromycin   250 mg Oral TID WC & HS   feeding supplement  237 mL Oral BID BM   insulin  aspart  0-20 Units Subcutaneous TID WC   insulin  aspart  0-5 Units Subcutaneous QHS   insulin  aspart  5 Units Subcutaneous TID WC   insulin  glargine-yfgn  30 Units Subcutaneous BID   levothyroxine   50 mcg Oral Q0600   metoprolol  succinate  25 mg Oral Daily   pantoprazole   40 mg Oral Daily   polyethylene glycol  17 g Oral Daily   rosuvastatin   20 mg Oral Daily   sacubitril -valsartan   1 tablet Oral BID   senna-docusate  2 tablet Oral BID   sodium chloride  flush  10-40 mL Intracatheter Q12H   sodium chloride  flush  3 mL Intravenous Q12H   spironolactone   25 mg  Oral Daily   torsemide   20 mg Oral Daily     Data Reviewed:   CBG:  Recent Labs  Lab 02/26/24 0729 02/26/24 1200 02/26/24 1738 02/26/24 2035 02/27/24 0725  GLUCAP 93 108* 188* 156* 132*    SpO2: 100 %    Vitals:   02/26/24 0942 02/26/24 1203 02/26/24 2037 02/27/24 0507  BP: 115/62 108/66 90/63 100/61  Pulse:  89 97 95  Resp:  18 18 18   Temp:  98.8 F (37.1 C) 98.7 F (37.1 C) 99.2 F (37.3 C)  TempSrc:  Oral Oral Oral  SpO2:  98% 96% 100%  Weight:    (!) 148 kg  Height:           Data Reviewed:  Basic Metabolic Panel: Recent Labs  Lab 02/20/24 1722 02/21/24 0534 02/22/24 0515 02/23/24 1124 02/24/24 0518 02/24/24 0520 02/25/24 0511 02/26/24 0504 02/27/24 0502  NA  --  137   < > 137  --  138 138 138 137  K  --  3.8   < > 3.5  --  3.3* 4.2 3.9 3.9  CL  --  99   < > 98  --  98 100 99 98  CO2  --  27   < > 29  --  29 30 29 29   GLUCOSE  --  235*   < > 204*  --  139* 108* 86 89  BUN  --  15   < > 20  --  23* 23* 21* 20  CREATININE  --  1.03*   < > 0.91  --  1.12* 1.03* 1.08* 1.16*  CALCIUM   --  8.7*   < > 8.8*  --  8.8* 9.0 8.9 8.9  MG 1.4* 2.3  --   --  1.6*  --   --   --   --    < > = values in this interval not displayed.    CBC: Recent Labs  Lab 02/23/24 1124 02/24/24 0520 02/25/24 0511 02/26/24 0504 02/27/24 0502  WBC 12.3* 12.5* 10.9* 11.8* 10.5  NEUTROABS 8.0* 7.6 6.4 6.9 5.7  HGB 10.2* 9.7* 10.0* 10.0* 9.8*  HCT 32.6* 30.8* 31.9* 31.9* 31.2*  MCV 92.4 91.4 89.6 92.2 92.0  PLT 223 223 216 241 247    LFT No results for input(s): AST, ALT, ALKPHOS, BILITOT, PROT, ALBUMIN  in the last 168 hours.   Antibiotics: Anti-infectives (From admission, onward)    Start     Dose/Rate Route Frequency Ordered Stop   02/20/24 1715  erythromycin  (E-MYCIN ) tablet 250 mg        250 mg Oral 3 times daily with meals & bedtime 02/20/24 1625     02/19/24 1330  doxycycline  (VIBRA -TABS) tablet 100 mg  Status:  Discontinued        100 mg Oral Every 12 hours 02/19/24 1232 02/26/24 1159   02/18/24 1000  vancomycin  (VANCOREADY) IVPB 1250 mg/250 mL  Status:  Discontinued        1,250 mg 166.7 mL/hr over 90 Minutes Intravenous Every 12 hours 02/18/24 0849 02/18/24 1320   02/17/24 2000  vancomycin  (VANCOREADY) IVPB 1500 mg/300 mL  Status:  Discontinued        1,500 mg 150 mL/hr over 120 Minutes Intravenous Every 12 hours 02/17/24 1101 02/18/24 0849   02/16/24 2000  vancomycin  (VANCOREADY) IVPB 1250 mg/250 mL  Status:  Discontinued        1,250  mg 166.7 mL/hr over 90 Minutes Intravenous Every 12  hours 02/16/24 0932 02/17/24 1101   02/15/24 2200  vancomycin  (VANCOCIN ) IVPB 1000 mg/200 mL premix  Status:  Discontinued        1,000 mg 200 mL/hr over 60 Minutes Intravenous Every 24 hours 02/14/24 2345 02/15/24 0958   02/15/24 2200  vancomycin  (VANCOREADY) IVPB 1750 mg/350 mL  Status:  Discontinued        1,750 mg 175 mL/hr over 120 Minutes Intravenous Every 24 hours 02/15/24 0958 02/16/24 0932   02/15/24 1000  ceFEPIme  (MAXIPIME ) 2 g in sodium chloride  0.9 % 100 mL IVPB  Status:  Discontinued        2 g 200 mL/hr over 30 Minutes Intravenous Every 8 hours 02/15/24 0958 02/19/24 1232   02/15/24 0900  ceFEPIme  (MAXIPIME ) 2 g in sodium chloride  0.9 % 100 mL IVPB  Status:  Discontinued        2 g 200 mL/hr over 30 Minutes Intravenous Every 12 hours 02/14/24 2345 02/15/24 0958   02/14/24 2345  metroNIDAZOLE  (FLAGYL ) IVPB 500 mg  Status:  Discontinued        500 mg 100 mL/hr over 60 Minutes Intravenous 2 times daily 02/14/24 2334 02/19/24 1232   02/14/24 2115  vancomycin  (VANCOREADY) IVPB 2000 mg/400 mL        2,000 mg 200 mL/hr over 120 Minutes Intravenous  Once 02/14/24 2110 02/15/24 0238   02/14/24 2115  ceFEPIme  (MAXIPIME ) 2 g in sodium chloride  0.9 % 100 mL IVPB        2 g 200 mL/hr over 30 Minutes Intravenous  Once 02/14/24 2110 02/14/24 2235        DVT prophylaxis: Apixaban   Code Status: Full code  Family Communication: No family at bedside   CONSULTS    Subjective   Denies any complaints.   Objective    Physical Examination:  Appears in no acute distress S1-S2, regular Lungs are clear to auscultation bilaterally, no wheezing or rhonchi auscultated Abdomen is soft, nontender Extremities-no edema in the lower extremities  Status is: Inpatient:             [name]   [facility] If 7PM-7AM, please contact night-coverage at [email or web address], Office  [phone]   02/27/2024, 8:14 AM   LOS: 13 days

## 2024-02-27 NOTE — TOC CM/SW Note (Signed)
    Durable Medical Equipment  (From admission, onward)           Start     Ordered   02/27/24 1224  For home use only DME Hospital bed  Once       Comments: Air mattress  Question Answer Comment  Length of Need Lifetime   The above medical condition requires: Patient requires the ability to reposition frequently   Head must be elevated greater than: 45 degrees   Bed type Semi-electric   Support Surface: Gel Overlay      02/27/24 1223   02/27/24 1224  For home use only DME Air overlay mattress  Once        02/27/24 1223   02/27/24 1221  For home use only DME Walker rolling  Once       Comments: 2 wheels, bariatric  Question Answer Comment  Walker: With 5 Inch Wheels   Patient needs a walker to treat with the following condition CHF (congestive heart failure) (HCC)      02/27/24 1221

## 2024-02-27 NOTE — TOC Transition Note (Addendum)
 Transition of Care [facility]) - Discharge Note   Patient Details  Name: [name] MRN: [id number] Date of Birth: [birth year]  Transition of Care [facility]) CM/SW Contact:  Bascom Service, RN Phone Number: 02/27/2024, 12:13 PM   Clinical Narrative: Declined ST SNF;No HHC agency to accept for PT or HHRN-wound care d/t insurance. Wound care clinic appt set. Private duty aide per patient to be taught wound care.DME to be ordered by MD-hospital bed,air mattress overlay, & bariatric w/c-await [employer] rep [employer] rep Jermaine to confirm if already has ordered any dme for patient.DME to arrive @ home prior d/c.PTAR @ d/c -12:47p   patient already had a w/c within the last 5 years-unable to get another one unless out of pocket cost.She can have the hospital bed, & air overlay gel mattress. [employer] will start to work on it await orders, & narrative note prior process for order. dme must arrive in home prior d/c  -1p Patient states her rollator from [employer] needs to be checked since wheels not functioning properly-left vm wMitch w/[employer]. [employer] will process hospital bed & air mattress for delivery to home prior d/c-patient will have someone @ home to receive dme.MD updated. -3p left message for [employer] rep Mith update on dme ETA for delivery of hospital bed/air overlay gel mattress to home prior d/c by PTAR-await outcome. -4p  still waiting on dme to be processed. No d/c today. MD updated.   Final next level of care: Home/Self Care Barriers to Discharge: No Home Care Agency will accept this patient (managed medicaid-no [facility])   Patient Goals and CMS Choice Patient states their goals for this hospitalization and ongoing recovery are:: Declines Rehab CMS Medicare.gov Compare Post Acute Care list provided to:: Patient Choice offered to / list presented to : Patient [place] ownership interest in [facility].provided  to:: Patient    Discharge Placement                       Discharge Plan and Services Additional resources added to the After Visit Summary for     Discharge Planning Services: CM Consult Post Acute Care Choice: Resumption of Svcs/PTA Provider                    HH Arranged:  (No HHC agency to accept.)          Social Drivers of Health (SDOH) Interventions SDOH Screenings   Food Insecurity: No Food Insecurity (02/18/2024)  Recent Concern: Food Insecurity - Medium Risk (02/07/2024)   Received from [facility]  Housing: Low Risk  (02/18/2024)  Recent Concern: Housing - High Risk (02/15/2024)  Transportation Needs: No Transportation Needs (02/18/2024)  Utilities: At Risk (02/18/2024)  Financial Resource Strain: Low Risk  (03/30/2022)   Received from [facility], [facility] visits prior to 10/01/2022.  Social Connections: Unknown (12/11/2022)   Received from [facility]  Stress: No Stress Concern Present (01/30/2024)   Received from [facility]  Tobacco Use: Medium Risk (02/20/2024)     Readmission Risk Interventions    02/18/2024    4:43 PM 04/27/2023    3:50 PM  Readmission Risk Prevention Plan  Transportation Screening Complete Complete  PCP or Specialist Appt within 3-5 Days  Complete  HRI or Home Care Consult  Complete  Social Work Consult for Recovery Care Planning/Counseling  Complete  Palliative Care Screening  Not Applicable  Medication Review [employer]) Complete Referral to Pharmacy  PCP  or Specialist appointment within 3-5 days of discharge Complete   HRI or Home Care Consult Complete   SW Recovery Care/Counseling Consult Complete   Palliative Care Screening Not Applicable   Skilled Nursing Facility Not Applicable

## 2024-02-27 NOTE — Progress Notes (Signed)
 Situation: Member enrolled in transitional  care navigation services    Background:Member with IP admission  at [facility] 01/29/2024-02/06/2024 for N/V, lumbar pain pain, abdominal pain, bilious    Assessment:  Updated Care Everywhere and chart review done 02/23/2024 shows member is currently admitted to [facility] 02/14/2024 with weakness, altered mental status and elevated troponins.    Recommendation:ACN will monitor for discharge   Kate Star RN, BSN, [facility] [place], [place] [address] Office number [phone]

## 2024-02-27 NOTE — Progress Notes (Signed)
 Physical Therapy Treatment Patient Details Name: [name] MRN: [id number] DOB: 1971/03/20 Today's Date: 02/27/2024   History of Present Illness Pt is 53 yo female admitted on 02/14/24 with acute encephalopathy (likely in setting of polypharmacy), SIRS, buttock wounds, AKI, and CHF.  Pt with hx including but not limited to chronic HFpEF, history of VTE on Eliquis , insulin -dependent T2DM, SLE, Sjogren's syndrome, HTN, gastroparesis, hypothyroidism, anemia, chronic back pain, morbid obesity    PT Comments  Pt seen for PT tx with pt reporting she's declining SNF, case manager reports pt is not eligible for home health services 2/2 insurance benefits. Pt reports she has an aide to assist 4 hours/day, 7 days/week. Session set up to simulate home environment when caregiver will not be present to assist pt. Pt reports she typically gets in/out of bed without assistance, turns to sit on rollator to roll to bathroom. Set bed height to simulate that of her's at home. Pt is able to complete supine>sit with bed flat, no rails with supervision, sit>stand with RW (PT stabilizing walker to simulate rollator) with CGA but did not progress to stepping. Pt unable to return supine from sitting, 2/2 unable to lift her legs onto elevated EOB. PT cautioned pt about discharging home with minimal assistance & buttocks wound & limited mobility but pt still reporting to d/c home vs SNF rehab. Will continue to follow pt acutely to progress mobility as able. Team made aware of pt's high fall risk & likely re-admission risk based on mobility alone.    If plan is discharge home, recommend the following: A lot of help with bathing/dressing/bathroom;Help with stairs or ramp for entrance;Assistance with cooking/housework;Two people to help with walking and/or transfers   Can travel by private vehicle     No  Equipment Recommendations  Hospital bed (air mattress overlay)    Recommendations for Other Services        Precautions / Restrictions Precautions Precautions: Fall Precaution/Restrictions Comments: buttock wounds - painful, foley Restrictions Weight Bearing Restrictions Per Provider Order: No     Mobility  Bed Mobility Overal bed mobility: Needs Assistance Bed Mobility: Supine to Sit     Supine to sit: Supervision (bed flat, no rails to simulate bed at home) Sit to supine: Max assist, +2 for physical assistance (Educated pt to attempt sit>supine without assistance, pt unable to sufficiently lift LLE to place onto bed, becomes frustrated re: situation. PT & mobility specialist utimately assisting.)        Transfers Overall transfer level: Needs assistance Equipment used: Rolling walker (2 wheels) Transfers: Sit to/from Stand Sit to Stand: Supervision, From elevated surface           General transfer comment: sit>stand from elevated EOB, pt pulling on RW with PT stabilizing RW as pt reports she usually pulls to stand with rollator.    Ambulation/Gait                   Stairs             Wheelchair Mobility     Tilt Bed    Modified Rankin (Stroke Patients Only)       Balance Overall balance assessment: Needs assistance Sitting-balance support: No upper extremity supported Sitting balance-Leahy Scale: Fair     Standing balance support: Reliant on assistive device for balance, Bilateral upper extremity supported Standing balance-Leahy Scale: Fair (static standing, did not attempt steps)  Communication Communication Communication: No apparent difficulties  Cognition Arousal: Alert Behavior During Therapy: WFL for tasks assessed/performed   PT - Cognitive impairments: No apparent impairments                         Following commands: Intact      Cueing Cueing Techniques: Verbal cues  Exercises      General Comments        Pertinent Vitals/Pain Pain Assessment Pain Assessment:  Faces Faces Pain Scale: No hurt    Home Living                          Prior Function            PT Goals (current goals can now be found in the care plan section) Acute Rehab PT Goals Patient Stated Goal: return home PT Goal Formulation: With patient Time For Goal Achievement: 03/04/24 Potential to Achieve Goals: Fair Progress towards PT goals: Progressing toward goals    Frequency    Min 2X/week      PT Plan      Co-evaluation              AM-PAC PT 6 Clicks Mobility   Outcome Measure  Help needed turning from your back to your side while in a flat bed without using bedrails?: A Little Help needed moving from lying on your back to sitting on the side of a flat bed without using bedrails?: None Help needed moving to and from a bed to a chair (including a wheelchair)?: A Lot Help needed standing up from a chair using your arms (e.g., wheelchair or bedside chair)?: A Lot Help needed to walk in hospital room?: Total Help needed climbing 3-5 steps with a railing? : Total 6 Click Score: 13    End of Session   Activity Tolerance: Patient tolerated treatment well Patient left: in bed;with call bell/phone within reach Nurse Communication: Mobility status PT Visit Diagnosis: Other abnormalities of gait and mobility (R26.89);Muscle weakness (generalized) (M62.81);Difficulty in walking, not elsewhere classified (R26.2)     Time: 8879-8852 PT Time Calculation (min) (ACUTE ONLY): 27 min  Charges:    $Therapeutic Activity: 23-37 mins PT General Charges $$ ACUTE PT VISIT: 1 Visit                     Richerd Pinal, PT, DPT 02/27/24, 1:04 PM    Richerd CHRISTELLA Pinal 02/27/2024, 1:01 PM

## 2024-02-28 DIAGNOSIS — I5021 Acute systolic (congestive) heart failure: Secondary | ICD-10-CM | POA: Diagnosis not present

## 2024-02-28 DIAGNOSIS — R7989 Other specified abnormal findings of blood chemistry: Secondary | ICD-10-CM | POA: Diagnosis not present

## 2024-02-28 DIAGNOSIS — I952 Hypotension due to drugs: Secondary | ICD-10-CM | POA: Diagnosis not present

## 2024-02-28 DIAGNOSIS — R531 Weakness: Secondary | ICD-10-CM | POA: Diagnosis not present

## 2024-02-28 DIAGNOSIS — E785 Hyperlipidemia, unspecified: Secondary | ICD-10-CM | POA: Diagnosis not present

## 2024-02-28 DIAGNOSIS — I10 Essential (primary) hypertension: Secondary | ICD-10-CM | POA: Diagnosis not present

## 2024-02-28 DIAGNOSIS — G9340 Encephalopathy, unspecified: Secondary | ICD-10-CM | POA: Diagnosis not present

## 2024-02-28 LAB — GLUCOSE, CAPILLARY
Glucose-Capillary: 180 mg/dL — ABNORMAL HIGH (ref 70–99)
Glucose-Capillary: 132 mg/dL — ABNORMAL HIGH (ref 70–99)
Glucose-Capillary: 104 mg/dL — ABNORMAL HIGH (ref 70–99)

## 2024-02-28 MED ORDER — EMPAGLIFLOZIN 10 MG PO TABS
10.0000 mg | ORAL_TABLET | Freq: Every day | ORAL | Status: DC
  Administered 2024-02-28 – 2024-02-29 (×2): 10 mg via ORAL
  Filled 2024-02-28 (×2): qty 1

## 2024-02-28 MED ORDER — TORSEMIDE 20 MG PO TABS: 20.0000 mg | ORAL_TABLET | Freq: Every day | ORAL | Status: DC | PRN

## 2024-02-28 MED ORDER — SACUBITRIL-VALSARTAN 24-26 MG PO TABS
1.0000 | ORAL_TABLET | Freq: Two times a day (BID) | ORAL | Status: DC
Start: 2024-02-28 — End: 2024-02-28
  Filled 2024-02-28: qty 1

## 2024-02-28 MED ORDER — SODIUM CHLORIDE 0.9 % IV BOLUS
500.0000 mL | Freq: Once | INTRAVENOUS | Status: AC
  Administered 2024-02-28: 500 mL via INTRAVENOUS

## 2024-02-28 MED ORDER — SPIRONOLACTONE 12.5 MG HALF TABLET
12.5000 mg | ORAL_TABLET | Freq: Every day | ORAL | Status: DC
  Administered 2024-02-29: 12.5 mg via ORAL
  Filled 2024-02-28 (×2): qty 1

## 2024-02-28 NOTE — Progress Notes (Addendum)
 Progress Note  Patient Name: [name] Date of Encounter: 02/28/2024 [facility] Cardiologist: Gordy Bergamo, MD   Interval Summary   Has been feeling light headed when standing up.  Vital Signs Vitals:   02/28/24 0445 02/28/24 0500 02/28/24 0625 02/28/24 0852  BP: (!) 89/47  (!) 96/50 (!) 99/56  Pulse: 94  92 98  Resp:   18 17  Temp:    99.5 F (37.5 C)  TempSrc:    Oral  SpO2:    96%  Weight:  (!) 150.6 kg    Height:        Intake/Output Summary (Last 24 hours) at 02/28/2024 1015 Last data filed at 02/28/2024 0700 Gross per 24 hour  Intake 360 ml  Output 2300 ml  Net -1940 ml      02/28/2024    5:00 AM 02/27/2024    5:07 AM 02/26/2024    5:00 AM  Last 3 Weights  Weight (lbs) 332 lb 0.2 oz 326 lb 4.5 oz 331 lb 12.7 oz  Weight (kg) 150.6 kg 148 kg 150.5 kg      Telemetry/ECG  Normal sinus rhythm with heart rates in the 80s to 100s- Personally Reviewed  Physical Exam  GEN: No acute distress.   Neck: No JVD Cardiac: RRR, no murmurs, rubs, or gallops.  Respiratory: Clear to auscultation bilaterally. GI: Soft, nontender, non-distended  MS: No edema  Assessment & Plan  [name] is a 53 y.o. female with a hx of nonischemic cardiomyopathy, Sjogren's syndrome, SLE, morbid obesity, OHS, OSA on CPAP, uncontrolled type 2 diabetes, gastroparesis, hypertension, history of CVA in 2014, hepatic adenoma s/p embolization in 2021, and history of PE on chronic anticoagulation with Eliquis  who was initially seen 02/15/2024 for elevated troponin.  Cardiology signed off on the patient.  Patient was added back onto cardiology rounding list.  Acute HFrEF  Hypertension  EF previously as low as 30-35%, LHC in 2017 showed normal coronaries and was previously treated as nonischemic cardiomyopathy. Echo 09/2022 showed improved EF 55-60%, and mild LVH. Echo earlier this admission on 02/15/24 showed reduced LVEF 35-40%, wall motion abnormalities consistent with Takotsubo  cardiomyopathy vs LAD infarct.  A repeat limited echo was done on 02/24/2024 and showed a normal LVEF of 65 to 70%. Patient denied any active or prior chest pain and EKG showed no ischemic changes.  Because of lack of symptoms and improved LVEF on echo no ischemic evaluation was done.  The patient was recommended to be considered for ischemic evaluation if she has chest pain or repeatedly elevated troponins GDMT initially held due to hypotension and AKI but was restarted after these resolved.  Has been diuresed and weight is down about 20 pounds. blood pressures have hypotensive recently as low as 84/48.  Most recent BP 99/56. Stop torsemide  20 mg daily. Start torsemide  20mg  daily as needed for weight gain of 5 lbs, worsening shortness of breath, or worsening lower extremity edema. Has outpatient follow-up with Glendia Ferrier PA-C on 03/13/2024 GDMT Stop Entresto  Continue Toprol  25 mg daily decrease spironolactone  to 12.5 mg daily Start Jardiance  10mg  daily   Elevated high-sensitivity troponin level 857 > 1,370 > 1,210 Denies chest pain, shortness of breath, orthopnea. Denies any symptoms with exertion but does live a sedentary lifestyle. LHC 2017 showed normal coronaries however with diabetes with complications including gastroparesis. A1c 7.3%  Defer cath as above. Not on ASA due to Eliquis  use for history of DVT   Hyperlipidemia Continue Crestor  20 mg  daily       Per primary History of DVT -on Eliquis  Acute encephalopathy SIRS Obesity class III Insulin  dependent type 2 diabetes with gastroparesis  Chronic anemia Hypothyroidism Chronic back pain SLE Sjgren's    For questions or updates, please contact [facility] Please consult [email or web address] for contact info under       Signed, Morse Clause, PA-C    Pt seen and examined   I have reviewed pt's hospital course    I agree with findings as noted by JENEANE Clause above   Echo early this admission showed LVEF 35 to 40% with  regional wall motion abnormalities consistent with Takotsubo's CM   Pt placed on medical Rx for HFrEF Repeat echo on 02/24/24 LVEF normalized     Over the past couple days pt has developed low BPs  On exam, she is currently comfortable in bed Morbidly obese  Neck  JVP is normal Lungs are CTA  Cardiac RRR  No S3   NO signficant murmurs Abd  Benign Ext with tr edema legs   1  Hypotension.    I would recomm backing down on GDMT and follow   Encourage some hydration now that LVEF normal  Will follow   2  HFimpr EF   LVEF early in admit signficantly lower than on 02/24/24 (5t to 60%)   Again, suggesting that trop bump related to spasm Follow BP and slowly add back meds as BP tolerates.   With improvement / normalization of LVEF  would not schedule ischemic work up at this time    3 HL   LDL 74  Started on Crestor    WIll need followu up  4   DM  Last A1C 7.3 (9 days ago)   Will continue to follow     Vina Gull MD

## 2024-02-28 NOTE — Progress Notes (Signed)
 Physical Therapy Treatment Patient Details Name: [name] MRN: [id number] DOB: 05/09/1971 Today's Date: 02/28/2024   History of Present Illness Pt is 53 yo female admitted on 02/14/24 with acute encephalopathy (likely in setting of polypharmacy), SIRS, buttock wounds, AKI, and CHF.  Pt with hx including but not limited to chronic HFpEF, history of VTE on Eliquis , insulin -dependent T2DM, SLE, Sjogren's syndrome, HTN, gastroparesis, hypothyroidism, anemia, chronic back pain, morbid obesity    PT Comments  Pt seen for PT tx with pt agreeable. Pt is able to complete supine>sit with supervision, sit>supine with mod assist. Pt sits EOB, endorses lightheadedness & later feeling hot but BP stable (120/73 mmHg MAP 88, HR 101 bpm). Pt performs seated BLE exercises, eventually politely reporting she needs to lay down. Will continue to follow pt acutely to progress mobility as able.    If plan is discharge home, recommend the following: A lot of help with bathing/dressing/bathroom;Help with stairs or ramp for entrance;Assistance with cooking/housework;Two people to help with walking and/or transfers   Can travel by private vehicle     No  Equipment Recommendations  Hospital bed (air mattress overlay)    Recommendations for Other Services       Precautions / Restrictions Precautions Precautions: Fall Precaution/Restrictions Comments: buttock wounds - painful, foley Restrictions Weight Bearing Restrictions Per Provider Order: No     Mobility  Bed Mobility Overal bed mobility: Needs Assistance Bed Mobility: Supine to Sit     Supine to sit: Supervision, Used rails (bed flat, minimal use of bed rails, exit L side of bed) Sit to supine: Mod assist, HOB elevated, Used rails (to elevate BLE onto flat bed)        Transfers                        Ambulation/Gait                   Stairs             Wheelchair Mobility     Tilt Bed    Modified Rankin  (Stroke Patients Only)       Balance Overall balance assessment: Needs assistance Sitting-balance support: Single extremity supported, No upper extremity supported Sitting balance-Leahy Scale: Fair Sitting balance - Comments: sitting EOB wtih single UE support, supervision                                    Communication Communication Factors Affecting Communication: Reduced clarity of speech (speaks at low volume)  Cognition Arousal: Alert Behavior During Therapy: Flat affect   PT - Cognitive impairments: No apparent impairments                         Following commands: Intact      Cueing Cueing Techniques: Verbal cues  Exercises General Exercises - Lower Extremity Long Arc Quad: AROM, Seated, Strengthening, Both, 20 reps Hip ABduction/ADduction: AROM, Strengthening, Both, 20 reps, Seated (hip adduction towel squeezes)    General Comments        Pertinent Vitals/Pain Pain Assessment Pain Assessment: No/denies pain    Home Living                          Prior Function            PT Goals (current goals can  now be found in the care plan section) Acute Rehab PT Goals Patient Stated Goal: return home PT Goal Formulation: With patient Time For Goal Achievement: 03/04/24 Potential to Achieve Goals: Fair Progress towards PT goals: Progressing toward goals    Frequency    Min 2X/week      PT Plan      Co-evaluation              AM-PAC PT 6 Clicks Mobility   Outcome Measure  Help needed turning from your back to your side while in a flat bed without using bedrails?: A Little Help needed moving from lying on your back to sitting on the side of a flat bed without using bedrails?: A Little Help needed moving to and from a bed to a chair (including a wheelchair)?: A Lot Help needed standing up from a chair using your arms (e.g., wheelchair or bedside chair)?: A Lot Help needed to walk in hospital room?:  Total Help needed climbing 3-5 steps with a railing? : Total 6 Click Score: 12    End of Session   Activity Tolerance: Patient limited by fatigue Patient left: in bed;with call bell/phone within reach   PT Visit Diagnosis: Other abnormalities of gait and mobility (R26.89);Muscle weakness (generalized) (M62.81);Difficulty in walking, not elsewhere classified (R26.2);Unsteadiness on feet (R26.81)     Time: 8894-8882 PT Time Calculation (min) (ACUTE ONLY): 12 min  Charges:    $Therapeutic Activity: 8-22 mins PT General Charges $$ ACUTE PT VISIT: 1 Visit                     Richerd Pinal, PT, DPT 02/28/24, 11:24 AM   Richerd CHRISTELLA Pinal 02/28/2024, 11:22 AM

## 2024-02-28 NOTE — Progress Notes (Signed)
 [facility]  PROGRESS NOTE  [name] FMW:[id number] DOB: 10/31/1970 DOA: 02/14/2024 PCP: [name], MD   Brief HPI:   53 y.o. female with medical history significant for chronic HFpEF, history of VTE on Eliquis , insulin -dependent T2DM, SLE, Sjogren's syndrome, HTN, gastroparesis, hypothyroidism, anemia, chronic back pain, morbid obesity who presented to the ED for evaluation of altered mental status  Patient recently admitted at [facility] from 6/30-7/8 for intractable nausea and vomiting related to gastroparesis. She had associated AKI. She was managed with antiemetics with improvement in nausea  and vomiting.    Assessment/Plan:   Acute encephalopathy Resolved.  Suspect from polypharmacy.  CT head negative.  Therapy eval recommending SNF. But patient wants to go home.     New onset systolic CHF/ Acute HRrEF/ elevated troponins Cardiology was consulted, ON torsemide  20 mg daily,  entresto , metoprolol  succinate 25 mg daily  and spironolactone  25 mg dialy,   Echo this admission shows  showed EF of 35 to 40% suspect stress cardiomyopathy.  Limited echocardiogram showed improvement in the LVEF, likely stress cardiomyopathy.  Cardiology defer inpatient cath. Recommend outpatient follow up with cardiology for further work up.   Hypotension -Blood pressure has been soft, patient feeling dizziness intermittently -Will consult cardiology for adjustment of medications -Patient started on multiple GDMT meds per cardiology for heart failure as above     Hypokalemia Replete   SIRS/ Pressure injury to the left upper buttocks From Pressure injury to left upper inner buttocks unstageable  She was initially started on broad spectrum IV antibiotics and narrowed to oral doxycycline . Complete the course of doxycycline .  Afebrile, with improvement in wbc count. Procalcitonin is negative.  She also received 3 days of IV cefepime  for e coli in the urine.  Wound care  consulted for buttocks wound.  She also has a foley catheter to prevent soiling of the pressure injury from urine and stool. She wants to keep it until discharge.  Patient requested for outpatient wound care center follow up .  -No home health agency accepted patient Patient's caregiver will be instructed on dressing changes Likely discharge home in a.m. Awaiting equipment to be delivered-to home patient  Diabetes mellitus type 2 - Continue Lantus , sliding scale insulin  with NovoLog  - CBG well-controlled      AKI - creatinine on admission is 2, improved with diuresis.  US  renal is negative for hydronephrosis.  Creatinine around 1 and stable.      H/o DVT On eliquis .    Body mass index is 49.29 kg/m. Obesity class III Recommend outpatient follow up with bariatric sugery.         Medications     apixaban   2.5 mg Oral BID   Chlorhexidine  Gluconate Cloth  6 each Topical Daily   collagenase    Topical Daily   feeding supplement  237 mL Oral BID BM   insulin  aspart  0-20 Units Subcutaneous TID WC   insulin  aspart  0-5 Units Subcutaneous QHS   insulin  aspart  5 Units Subcutaneous TID WC   insulin  glargine-yfgn  30 Units Subcutaneous BID   levothyroxine   50 mcg Oral Q0600   metoprolol  succinate  25 mg Oral Daily   pantoprazole   40 mg Oral Daily   polyethylene glycol  17 g Oral Daily   rosuvastatin   20 mg Oral Daily   sacubitril -valsartan   1 tablet Oral BID   senna-docusate  2 tablet Oral BID   sodium chloride  flush  10-40 mL Intracatheter Q12H  sodium chloride  flush  3 mL Intravenous Q12H   spironolactone   25 mg Oral Daily   torsemide   20 mg Oral Daily     Data Reviewed:   CBG:  Recent Labs  Lab 02/27/24 0725 02/27/24 1150 02/27/24 1654 02/27/24 2011 02/28/24 0741  GLUCAP 132* 219* 100* 98 180*    SpO2: 94 %    Vitals:   02/28/24 0431 02/28/24 0445 02/28/24 0500 02/28/24 0625  BP: (!) 84/48 (!) 89/47  (!) 96/50  Pulse: 96 94  92  Resp: 17   18   Temp: 98.8 F (37.1 C)     TempSrc:      SpO2: 94%     Weight:   (!) 150.6 kg   Height:          Data Reviewed:  Basic Metabolic Panel: Recent Labs  Lab 02/23/24 1124 02/24/24 0518 02/24/24 0520 02/25/24 0511 02/26/24 0504 02/27/24 0502  NA 137  --  138 138 138 137  K 3.5  --  3.3* 4.2 3.9 3.9  CL 98  --  98 100 99 98  CO2 29  --  29 30 29 29   GLUCOSE 204*  --  139* 108* 86 89  BUN 20  --  23* 23* 21* 20  CREATININE 0.91  --  1.12* 1.03* 1.08* 1.16*  CALCIUM  8.8*  --  8.8* 9.0 8.9 8.9  MG  --  1.6*  --   --   --   --     CBC: Recent Labs  Lab 02/23/24 1124 02/24/24 0520 02/25/24 0511 02/26/24 0504 02/27/24 0502  WBC 12.3* 12.5* 10.9* 11.8* 10.5  NEUTROABS 8.0* 7.6 6.4 6.9 5.7  HGB 10.2* 9.7* 10.0* 10.0* 9.8*  HCT 32.6* 30.8* 31.9* 31.9* 31.2*  MCV 92.4 91.4 89.6 92.2 92.0  PLT 223 223 216 241 247    LFT No results for input(s): AST, ALT, ALKPHOS, BILITOT, PROT, ALBUMIN  in the last 168 hours.   Antibiotics: Anti-infectives (From admission, onward)    Start     Dose/Rate Route Frequency Ordered Stop   02/20/24 1715  erythromycin  (E-MYCIN ) tablet 250 mg  Status:  Discontinued        250 mg Oral 3 times daily with meals & bedtime 02/20/24 1625 02/28/24 0756   02/19/24 1330  doxycycline  (VIBRA -TABS) tablet 100 mg  Status:  Discontinued        100 mg Oral Every 12 hours 02/19/24 1232 02/26/24 1159   02/18/24 1000  vancomycin  (VANCOREADY) IVPB 1250 mg/250 mL  Status:  Discontinued        1,250 mg 166.7 mL/hr over 90 Minutes Intravenous Every 12 hours 02/18/24 0849 02/18/24 1320   02/17/24 2000  vancomycin  (VANCOREADY) IVPB 1500 mg/300 mL  Status:  Discontinued        1,500 mg 150 mL/hr over 120 Minutes Intravenous Every 12 hours 02/17/24 1101 02/18/24 0849   02/16/24 2000  vancomycin  (VANCOREADY) IVPB 1250 mg/250 mL  Status:  Discontinued        1,250 mg 166.7 mL/hr over 90 Minutes Intravenous Every 12 hours 02/16/24 0932 02/17/24 1101    02/15/24 2200  vancomycin  (VANCOCIN ) IVPB 1000 mg/200 mL premix  Status:  Discontinued        1,000 mg 200 mL/hr over 60 Minutes Intravenous Every 24 hours 02/14/24 2345 02/15/24 0958   02/15/24 2200  vancomycin  (VANCOREADY) IVPB 1750 mg/350 mL  Status:  Discontinued        1,750 mg 175 mL/hr over 120  Minutes Intravenous Every 24 hours 02/15/24 0958 02/16/24 0932   02/15/24 1000  ceFEPIme  (MAXIPIME ) 2 g in sodium chloride  0.9 % 100 mL IVPB  Status:  Discontinued        2 g 200 mL/hr over 30 Minutes Intravenous Every 8 hours 02/15/24 0958 02/19/24 1232   02/15/24 0900  ceFEPIme  (MAXIPIME ) 2 g in sodium chloride  0.9 % 100 mL IVPB  Status:  Discontinued        2 g 200 mL/hr over 30 Minutes Intravenous Every 12 hours 02/14/24 2345 02/15/24 0958   02/14/24 2345  metroNIDAZOLE  (FLAGYL ) IVPB 500 mg  Status:  Discontinued        500 mg 100 mL/hr over 60 Minutes Intravenous 2 times daily 02/14/24 2334 02/19/24 1232   02/14/24 2115  vancomycin  (VANCOREADY) IVPB 2000 mg/400 mL        2,000 mg 200 mL/hr over 120 Minutes Intravenous  Once 02/14/24 2110 02/15/24 0238   02/14/24 2115  ceFEPIme  (MAXIPIME ) 2 g in sodium chloride  0.9 % 100 mL IVPB        2 g 200 mL/hr over 30 Minutes Intravenous  Once 02/14/24 2110 02/14/24 2235        DVT prophylaxis: Apixaban   Code Status: Full code  Family Communication: No family at bedside   CONSULTS    Subjective   Has been feeling dizzy intermittently.  Blood pressure has been low.  She was started on GDMT for heart failure per cardiology.  Objective    Physical Examination:  Appears in no acute distress S1-S2, regular Lungs clear to auscultation bilaterally Abdomen is soft, nontender, no organomegaly  Status is: Inpatient:             [name]   [facility] If 7PM-7AM, please contact night-coverage at [email or web address], Office  [phone]   02/28/2024, 7:59 AM  LOS: 14 days

## 2024-02-28 NOTE — TOC Transition Note (Addendum)
 Transition of Care [facility]) - Discharge Note   Patient Details  Name: [name] MRN: [id number] Date of Birth: [birth year]  Transition of Care [facility]) CM/SW Contact:  Bascom Service, RN Phone Number: 02/28/2024, 10:02 AM   Clinical Narrative: Patient agrees to d/c home-aware of declining ST SNF, No HHC agency to accept;already has rollator,w/c-[employer] to check rollator in the home on functioning ability. [employer] to deliver hospital bed,air mattress to home prior d/c by [facility]-confirmed address. 1p -received message to contact patients insurance case manager-Diane Harrington [phone] unable to get [facility] agency to accept for HHRN/PT/OT-await call back.  2p-[employer] DME delivery re scheduled for delivery tomorrow between 11a-12p. [facility] @ d/c. -4p-Received [facility] agency list fro Diane-there were non on the list that I have not already contacted & [facility] agencies are unable to accept. Patient/Team updated.       Final next level of care: Home/Self Care Barriers to Discharge: No Barriers Identified   Patient Goals and CMS Choice Patient states their goals for this hospitalization and ongoing recovery are:: Home CMS Medicare.gov Compare Post Acute Care list provided to:: Patient Choice offered to / list presented to : Patient [place] ownership interest in [facility].provided to:: Patient    Discharge Placement                       Discharge Plan and Services Additional resources added to the After Visit Summary for     Discharge Planning Services: CM Consult Post Acute Care Choice: Resumption of Svcs/PTA Provider          DME Arranged: Air overlay mattress, Hospital bed DME Agency: [employer] Date DME Agency Contacted: 02/28/24 Time DME Agency Contacted: 1001 Representative spoke with at DME Agency: Mitch HH Arranged: Refused SNF (No HHC agency to accept-[facility]           Social Drivers of Health (SDOH) Interventions SDOH Screenings   Food Insecurity: No Food Insecurity (02/18/2024)  Recent Concern: Food Insecurity - Medium Risk (02/07/2024)   Received from [facility]  Housing: Low Risk  (02/18/2024)  Recent Concern: Housing - High Risk (02/15/2024)  Transportation Needs: No Transportation Needs (02/18/2024)  Utilities: At Risk (02/18/2024)  Financial Resource Strain: Low Risk  (03/30/2022)   Received from [facility] visits prior to 10/01/2022.  Social Connections: Unknown (12/11/2022)   Received from [facility]  Stress: No Stress Concern Present (01/30/2024)   Received from [facility]  Tobacco Use: Medium Risk (02/20/2024)     Readmission Risk Interventions    02/18/2024    4:43 PM 04/27/2023    3:50 PM  Readmission Risk Prevention Plan  Transportation Screening Complete Complete  PCP or Specialist Appt within 3-5 Days  Complete  HRI or Home Care Consult  Complete  Social Work Consult for Recovery Care Planning/Counseling  Complete  Palliative Care Screening  Not Applicable  Medication Review [employer]) Complete Referral to Pharmacy  PCP or Specialist appointment within 3-5 days of discharge Complete   HRI or Home Care Consult Complete   SW Recovery Care/Counseling Consult Complete   Palliative Care Screening Not Applicable   Skilled Nursing Facility Not Applicable

## 2024-02-29 ENCOUNTER — Other Ambulatory Visit (HOSPITAL_COMMUNITY): Payer: Self-pay

## 2024-02-29 DIAGNOSIS — G9340 Encephalopathy, unspecified: Secondary | ICD-10-CM | POA: Diagnosis not present

## 2024-02-29 DIAGNOSIS — R7989 Other specified abnormal findings of blood chemistry: Secondary | ICD-10-CM | POA: Diagnosis not present

## 2024-02-29 DIAGNOSIS — Z86711 Personal history of pulmonary embolism: Secondary | ICD-10-CM

## 2024-02-29 DIAGNOSIS — R4182 Altered mental status, unspecified: Secondary | ICD-10-CM | POA: Diagnosis not present

## 2024-02-29 DIAGNOSIS — R531 Weakness: Secondary | ICD-10-CM | POA: Diagnosis not present

## 2024-02-29 LAB — GLUCOSE, CAPILLARY
Glucose-Capillary: 160 mg/dL — ABNORMAL HIGH (ref 70–99)
Glucose-Capillary: 170 mg/dL — ABNORMAL HIGH (ref 70–99)
Glucose-Capillary: 89 mg/dL (ref 70–99)
Glucose-Capillary: 128 mg/dL — ABNORMAL HIGH (ref 70–99)
Glucose-Capillary: 126 mg/dL — ABNORMAL HIGH (ref 70–99)

## 2024-02-29 MED ORDER — COLLAGENASE 250 UNIT/GM EX OINT
TOPICAL_OINTMENT | Freq: Every day | CUTANEOUS | 0 refills | Status: DC
  Filled 2024-02-29: qty 30, 30d supply, fill #0

## 2024-02-29 MED ORDER — APIXABAN 2.5 MG PO TABS
2.5000 mg | ORAL_TABLET | Freq: Two times a day (BID) | ORAL | 1 refills | Status: AC
  Filled 2024-02-29: qty 60, 30d supply, fill #0

## 2024-02-29 MED ORDER — ROSUVASTATIN CALCIUM 20 MG PO TABS
20.0000 mg | ORAL_TABLET | Freq: Every day | ORAL | 3 refills | Status: AC
  Filled 2024-02-29: qty 90, 90d supply, fill #0

## 2024-02-29 MED ORDER — LOSARTAN POTASSIUM 25 MG PO TABS: 12.5000 mg | ORAL_TABLET | Freq: Every day | ORAL | Status: DC

## 2024-02-29 MED ORDER — SPIRONOLACTONE 25 MG PO TABS
12.5000 mg | ORAL_TABLET | Freq: Every day | ORAL | 1 refills | Status: AC
  Filled 2024-02-29: qty 30, 60d supply, fill #0

## 2024-02-29 MED ORDER — METOPROLOL SUCCINATE ER 25 MG PO TB24
25.0000 mg | ORAL_TABLET | Freq: Every day | ORAL | 3 refills | Status: DC
  Filled 2024-02-29: qty 30, 30d supply, fill #0

## 2024-02-29 MED ORDER — TORSEMIDE 20 MG PO TABS
20.0000 mg | ORAL_TABLET | Freq: Every day | ORAL | 1 refills | Status: AC | PRN
  Filled 2024-02-29: qty 30, 30d supply, fill #0

## 2024-02-29 MED ORDER — EMPAGLIFLOZIN 10 MG PO TABS
10.0000 mg | ORAL_TABLET | Freq: Every day | ORAL | 1 refills | Status: AC
  Filled 2024-02-29: qty 30, 30d supply, fill #0

## 2024-02-29 NOTE — Discharge Summary (Signed)
 Physician Discharge Summary   Patient: [name] MRN: [id number] DOB: [birth year]  Admit date:     02/14/2024  Discharge date: 02/29/24  Discharge Physician: [name]   PCP: [name], MD   Recommendations at discharge:   Follow-up cardiology outpatient Follow-up PCP as outpatient  Discharge Diagnoses: Principal Problem:   Acute encephalopathy Active Problems:   SIRS (systemic inflammatory response syndrome) (HCC)   Elevated troponin   Acute renal failure superimposed on stage 3a chronic kidney disease (HCC)   Chronic heart failure with preserved ejection fraction (HFpEF) (HCC)   Morbid obesity (HCC)   Type 2 diabetes mellitus with hyperlipidemia (HCC)   Normocytic anemia   SLE (systemic lupus erythematosus related syndrome) (HCC)   Hypothyroidism   History of venous thromboembolism   Anxiety and depression   Non-ST elevation (NSTEMI) myocardial infarction (HCC)   Acute HFrEF (heart failure with reduced ejection fraction) (HCC)  Resolved Problems:   * No resolved hospital problems. *  Hospital Course: 53 y.o. female with medical history significant for chronic HFpEF, history of VTE on Eliquis , insulin -dependent T2DM, SLE, Sjogren's syndrome, HTN, gastroparesis, hypothyroidism, anemia, chronic back pain, morbid obesity who presented to the ED for evaluation of altered mental status  Patient recently admitted at [facility] from 6/30-7/8 for intractable nausea and vomiting related to gastroparesis. She had associated AKI. She was managed with antiemetics with improvement in nausea  and vomiting.  Assessment and Plan:  Acute encephalopathy Resolved.  Suspect from polypharmacy.  CT head negative.  Therapy eval recommending SNF. But patient wants to go home.     New onset systolic CHF/ Acute HRrEF/ elevated troponins Cardiology was consulted, ON torsemide  20 mg daily,  entresto , metoprolol  succinate 25 mg daily  and spironolactone  25 mg dialy,    Echo this admission shows  showed EF of 35 to 40% suspect stress cardiomyopathy.  Limited echocardiogram showed improvement in the LVEF, likely stress cardiomyopathy.  Cardiology defer inpatient cath. Recommend outpatient follow up with cardiology for further work up.    Hypotension -Blood pressure has been soft, patient feeling dizziness intermittently -Will consult cardiology for adjustment of medications -Patient started on multiple GDMT meds per cardiology for heart failure as above - Meds have been changed, will discharge on Toprol -XL 25 mg daily, Aldactone  12.5 mg daily -Continue Jardiance  10 mg daily -Torsemide  20 mg to be taken as needed for weight gain     Hypokalemia Replete   SIRS/ Pressure injury to the left upper buttocks From Pressure injury to left upper inner buttocks unstageable  She was initially started on broad spectrum IV antibiotics and narrowed to oral doxycycline . Complete the course of doxycycline .  Afebrile, with improvement in wbc count. Procalcitonin is negative.  She also received 3 days of IV cefepime  for e coli in the urine.  Wound care consulted for buttocks wound.  She also has a foley catheter to prevent soiling of the pressure injury from urine and stool. She wants to keep it until discharge.  Patient requested for outpatient wound care center follow up .  -No home health agency accepted patient Patient's caregiver will be instructed on dressing changes Awaiting equipment to be delivered-to home patient   Diabetes mellitus type 2 - Continue home regimen       AKI - creatinine on admission is 2, improved with diuresis.  US  renal is negative for hydronephrosis.  Creatinine around 1 and stable.      H/o DVT On eliquis .  Body mass index is 49.29 kg/m. Obesity class III Recommend outpatient follow up with bariatric sugery.       Consultants: Cardiology Procedures performed:   Disposition: Home Diet recommendation:  Discharge Diet  Orders (From admission, onward)     Start     Ordered   02/29/24 0000  Diet - low sodium heart healthy        02/29/24 1521           Cardiac diet DISCHARGE MEDICATION: Allergies as of 02/29/2024       Reactions   Metoclopramide  Other (See Comments)   Developed restless leg, akathisia type limb movements.    Codeine Itching, Rash   Hydrocodone  Rash   Percocet [oxycodone -acetaminophen ] Hives, Rash   Tolerates dilaudid , Tolerates acetaminophen          Medication List     STOP taking these medications    ALPRAZolam  0.5 MG tablet Commonly known as: XANAX    ARIPiprazole  5 MG tablet Commonly known as: ABILIFY    baclofen 10 MG tablet Commonly known as: LIORESAL   buPROPion  300 MG 24 hr tablet Commonly known as: WELLBUTRIN  XL   carvedilol  25 MG tablet Commonly known as: COREG    cephALEXin  500 MG capsule Commonly known as: KEFLEX    cromolyn  4 % ophthalmic solution Commonly known as: OPTICROM    diphenhydrAMINE  25 mg capsule Commonly known as: BENADRYL    Entresto  97-103 MG Generic drug: sacubitril -valsartan    FreeStyle Libre 3 Sensor Misc   HYDROmorphone  2 MG tablet Commonly known as: DILAUDID    isosorbide -hydrALAZINE  20-37.5 MG tablet Commonly known as: BIDIL    megestrol  40 MG tablet Commonly known as: MEGACE    multivitamin with minerals Tabs tablet   ondansetron  4 MG disintegrating tablet Commonly known as: ZOFRAN -ODT   onetouch ultrasoft lancets   predniSONE  20 MG tablet Commonly known as: DELTASONE    pregabalin  100 MG capsule Commonly known as: LYRICA    pregabalin  75 MG capsule Commonly known as: LYRICA    QUEtiapine  300 MG tablet Commonly known as: SEROQUEL    sucralfate  1 GM/10ML suspension Commonly known as: CARAFATE    Transderm-Scop 1 MG/3DAYS Generic drug: scopolamine        TAKE these medications    Advair HFA 230-21 MCG/ACT inhaler Generic drug: fluticasone -salmeterol Inhale 2 puffs into the lungs in the morning, at  noon, and at bedtime.   albuterol  (2.5 MG/3ML) 0.083% nebulizer solution Commonly known as: PROVENTIL  Take 2.5 mg by nebulization as needed for wheezing.   apixaban  2.5 MG Tabs tablet Commonly known as: ELIQUIS  Take 1 tablet (2.5 mg total) by mouth 2 (two) times daily.   Azathioprine  75 MG Tabs Take 1 tablet by mouth 2 (two) times daily.   B-D UF III MINI PEN NEEDLES 31G X 5 MM Misc Generic drug: Insulin  Pen Needle USE AS DIRECTED TWICE A DAY   Easy Touch Pen Needles 32G X 4 MM Misc Generic drug: Insulin  Pen Needle USE TO INJECT INSULIN  TWICE DAILY   Benlysta 200 MG/ML Soaj Generic drug: Belimumab INJECT THE CONTENTS OF 1 PEN INTO THE SKIN EVERY 7 DAYS.   collagenase  250 UNIT/GM ointment Commonly known as: SANTYL  Apply topically daily. Start taking on: March 01, 2024   cycloSPORINE  0.05 % ophthalmic emulsion Commonly known as: RESTASIS  Place 1 drop into both eyes in the morning, at noon, and at bedtime.   diclofenac  Sodium 1 % Gel Commonly known as: VOLTAREN  APPLY 2 GRAMS TO AFFECTED AREA 4 TIMES A DAY What changed: See the new instructions.   empagliflozin  10 MG Tabs tablet  Commonly known as: JARDIANCE  Take 1 tablet (10 mg total) by mouth daily. Start taking on: March 01, 2024   famotidine  20 MG tablet Commonly known as: PEPCID  Take 20 mg by mouth 2 (two) times daily.   fluticasone  50 MCG/ACT nasal spray Commonly known as: FLONASE  Place 2 sprays into both nostrils daily as needed for allergies or rhinitis.   HumuLIN  R U-500 KwikPen 500 UNIT/ML KwikPen Generic drug: insulin  regular human CONCENTRATED 100 UNITS, 30 MINUTES BEFORE EACH MEAL What changed: See the new instructions.   hydroxychloroquine  200 MG tablet Commonly known as: PLAQUENIL  Take 200 mg by mouth 2 (two) times daily.   Insulin  Syringe-Needle U-100 31G X 5/16 1 ML Misc Commonly known as: Easy Touch Insulin  Syringe USE AS DIRECTED THREE TIMES A DAY   levothyroxine  50 MCG tablet Commonly  known as: SYNTHROID  Take 1 tablet (50 mcg total) by mouth daily at 6 (six) AM.   meclizine  12.5 MG tablet Commonly known as: ANTIVERT  Take 1 tablet (12.5 mg total) by mouth 3 (three) times daily as needed for dizziness.   metFORMIN  500 MG 24 hr tablet Commonly known as: GLUCOPHAGE -XR Take 2 tablets (1,000 mg total) by mouth 2 (two) times daily.   metoCLOPramide  10 MG tablet Commonly known as: REGLAN  Take 1 tablet (10 mg total) by mouth 3 (three) times daily before meals. What changed:  how much to take when to take this reasons to take this   metoprolol  succinate 25 MG 24 hr tablet Commonly known as: TOPROL -XL Take 1 tablet (25 mg total) by mouth daily. Take with or immediately following a meal. Start taking on: March 01, 2024   OneTouch Ultra test strip Generic drug: glucose blood Test blood sugar 4 times a day   oxybutynin  5 MG 24 hr tablet Commonly known as: DITROPAN -XL Take 5 mg by mouth daily.   pantoprazole  40 MG tablet Commonly known as: PROTONIX  Take 1 tablet (40 mg total) by mouth daily at 12 noon. What changed: when to take this   rosuvastatin  20 MG tablet Commonly known as: CRESTOR  Take 1 tablet (20 mg total) by mouth daily.   spironolactone  25 MG tablet Commonly known as: ALDACTONE  Take 0.5 tablets (12.5 mg total) by mouth daily. Start taking on: March 01, 2024 What changed:  medication strength how much to take   topiramate  25 MG tablet Commonly known as: TOPAMAX  Take 75 mg by mouth at bedtime.   torsemide  20 MG tablet Commonly known as: DEMADEX  Take 1 tablet (20 mg total) by mouth daily as needed (as needed for weight gain of 5-10 lbs, worsening shortness of breath, or worsening lower extremity edema.). What changed:  when to take this reasons to take this   traMADol  50 MG tablet Commonly known as: ULTRAM  Take 1 tablet (50 mg total) by mouth every 6 (six) hours as needed.   Vitamin D (Ergocalciferol) 1.25 MG (50000 UNIT) Caps  capsule Commonly known as: DRISDOL Take 50,000 Units by mouth once a week.               Durable Medical Equipment  (From admission, onward)           Start     Ordered   02/27/24 1631  For home use only DME Air overlay mattress  Once       Comments: Heavy duty gel overlay   02/27/24 1630   02/27/24 1224  For home use only DME Hospital bed  Once       Comments:  Air mattress  Question Answer Comment  Length of Need Lifetime   The above medical condition requires: Patient requires the ability to reposition frequently   Head must be elevated greater than: 45 degrees   Bed type Semi-electric   Support Surface: Gel Overlay      02/27/24 1223   02/27/24 1221  For home use only DME Walker rolling  Once       Comments: 2 wheels, bariatric  Question Answer Comment  Walker: With 5 Inch Wheels   Patient needs a walker to treat with the following condition CHF (congestive heart failure) (HCC)      02/27/24 1221              Discharge Care Instructions  (From admission, onward)           Start     Ordered   02/29/24 0000  Discharge wound care:       Comments: Wound care  Daily      Comments: 1. Apply Santyl  to left buttock wound Q day, then cover with moist gauze and foam dressing.  Change foam dressing Q 3 days or PRN soiling 2. Foam dressing to right buttock.Change foam dressing Q 3 days or PRN soiling   02/29/24 1521            Follow-up Information      [facility] [place] Follow up on 03/18/2024.   Specialty: Wound Care Why: @ 12:30p Dr. Marolyn. Contact information: [address], [address] [place]  [address] [phone]        [facility] Follow up.   Why: hospital bed,air mattress Contact information: [address] [place] [address] [phone]         [facility] Follow up.   Specialty:  Rehabilitation Why: They will call you to set up appt. After 2 days call if you have not received a call for appt. Contact information: [address] [place]   [phone]               Discharge Exam: Fredricka Weights   02/27/24 0507 02/28/24 0500 02/29/24 0500  Weight: (!) 148 kg (!) 150.6 kg (!) 150.6 kg   Appears in no acute distress S1-S2, regular Lungs clear to auscultation bilaterally Abdomen is soft, nontender, no organomegaly  Condition at discharge: good  The results of significant diagnostics from this hospitalization (including imaging, microbiology, ancillary and laboratory) are listed below for reference.   Imaging Studies: ECHOCARDIOGRAM LIMITED Result Date: 02/24/2024    ECHOCARDIOGRAM LIMITED REPORT   Patient Name:   [name] Date of Exam: 02/24/2024 Medical Rec #:  [id number]     Height:       69.0 in Accession #:    [id number]    Weight:       337.3 lb Date of Birth:  1970-10-28     BSA:          2.580 m Patient Age:    53 years      BP:           101/54 mmHg Patient Gender: F             HR:           90 bpm. Exam Location:  Inpatient Procedure: Limited Echo, Cardiac Doppler, Color Doppler and Intracardiac  Opacification Agent (Both Spectral and Color Flow Doppler were            utilized during procedure). Indications:    I50.40* Unspecified combined systolic (congestive) and diastolic                 (congestive) heart failure  History:        Patient has prior history of Echocardiogram examinations, most                 recent 02/15/2024. CHF, Previous Myocardial Infarction, Stroke,                 Signs/Symptoms:Altered Mental Status and Bacteremia; Risk                 Factors:Hypertension.  Sonographer:    Ellouise Mose RDCS Referring Phys: [id number] SOYLA DELENA MERCK  Sonographer Comments: Technically difficult study due to poor echo windows and patient is obese. Image acquisition challenging due to patient body habitus.  Limited study for LV function and wall motion. IMPRESSIONS  1. Left ventricular ejection fraction, by estimation, is 65 to 70%. The left ventricle has normal function. The left ventricle has no regional wall motion abnormalities. There is mild asymmetric left ventricular hypertrophy of the infero-lateral segment.  2. Right ventricular systolic function is normal. The right ventricular size is normal. There is normal pulmonary artery systolic pressure.  3. A small pericardial effusion is present. There is no evidence of cardiac tamponade.  4. The mitral valve is grossly normal. Trivial mitral valve regurgitation.  5. The aortic valve is tricuspid. Aortic valve regurgitation is not visualized.  6. The inferior vena cava is normal in size with greater than 50% respiratory variability, suggesting right atrial pressure of 3 mmHg. Comparison(s): The left ventricular function has improved. The left ventricular wall motion abnormalities are improved. FINDINGS  Left Ventricle: Left ventricular ejection fraction, by estimation, is 65 to 70%. The left ventricle has normal function. The left ventricle has no regional wall motion abnormalities. Definity  contrast agent was given IV to delineate the left ventricular  endocardial borders. There is mild asymmetric left ventricular hypertrophy of the infero-lateral segment. Right Ventricle: The right ventricular size is normal. Right vetricular wall thickness was not well visualized. Right ventricular systolic function is normal. There is normal pulmonary artery systolic pressure. The tricuspid regurgitant velocity is 2.32 m/s, and with an assumed right atrial pressure of 3 mmHg, the estimated right ventricular systolic pressure is 24.5 mmHg. Pericardium: A small pericardial effusion is present. There is no evidence of cardiac tamponade. Mitral Valve: The mitral valve is grossly normal. Trivial mitral valve regurgitation. Tricuspid Valve: The tricuspid valve is grossly normal.  Tricuspid valve regurgitation is mild . No evidence of tricuspid stenosis. Aortic Valve: The aortic valve is tricuspid. Aortic valve regurgitation is not visualized. Aorta: The aortic root and ascending aorta are structurally normal, with no evidence of dilitation. Venous: The inferior vena cava is normal in size with greater than 50% respiratory variability, suggesting right atrial pressure of 3 mmHg. Additional Comments: Spectral Doppler performed. Color Doppler performed.  LEFT VENTRICLE PLAX 2D LVIDd:         5.60 cm LVIDs:         3.35 cm LV PW:         1.25 cm LV IVS:        0.95 cm  LV Volumes (MOD) LV vol d, MOD A2C: 127.0 ml LV vol d, MOD A4C: 103.0 ml LV vol s,  MOD A2C: 40.6 ml LV vol s, MOD A4C: 29.0 ml LV SV MOD A2C:     86.4 ml LV SV MOD A4C:     103.0 ml LV SV MOD BP:      80.0 ml IVC IVC diam: 1.60 cm  AORTA Ao Asc diam: 3.20 cm TRICUSPID VALVE TR Peak grad:   21.5 mmHg TR Vmax:        232.00 cm/s Soyla Merck MD Electronically signed by Soyla Merck MD Signature Date/Time: 02/24/2024/10:07:51 AM    Final    CT ABDOMEN PELVIS W CONTRAST Result Date: 02/23/2024 CLINICAL DATA:  Abdominal pain. EXAM: CT ABDOMEN AND PELVIS WITH CONTRAST TECHNIQUE: Multidetector CT imaging of the abdomen and pelvis was performed using the standard protocol following bolus administration of intravenous contrast. RADIATION DOSE REDUCTION: This exam was performed according to the departmental dose-optimization program which includes automated exposure control, adjustment of the mA and/or kV according to patient size and/or use of iterative reconstruction technique. CONTRAST:  OMNIPAQUE  IOHEXOL  300 MG/ML  SOLN COMPARISON:  CT abdomen pelvis dated 01/25/2024. FINDINGS: Lower chest: The visualized lung bases are clear. No intra-abdominal free air or free fluid. Hepatobiliary: Faint ill-defined 12 mm hypodense lesion in the right lobe of the liver is not visualized on the delayed images. This can be better evaluated  with ultrasound on an outpatient/nonemergent basis. No biliary ductal dilatation. The gallbladder is unremarkable. Pancreas: Unremarkable. No pancreatic ductal dilatation or surrounding inflammatory changes. Spleen: Normal in size without focal abnormality. Adrenals/Urinary Tract: The adrenal glands are unremarkable. There is a 3 mm nonobstructing right renal upper pole calculus. No hydronephrosis. The left kidney is unremarkable. There is symmetric enhancement and excretion of contrast by both kidneys. The visualized ureters appear unremarkable. The urinary bladder is decompressed around a Foley catheter. Stomach/Bowel: There is no bowel obstruction or active inflammation. The appendix is normal. Vascular/Lymphatic: Mild atherosclerotic calcification of the abdominal aorta. The IVC is unremarkable. No portal venous gas. There is no adenopathy. Reproductive: The uterus and ovaries are grossly unremarkable. No suspicious adnexal masses. Other: None Musculoskeletal: No acute or significant osseous findings. IMPRESSION: 1. No acute intra-abdominal or pelvic pathology. 2. A 3 mm nonobstructing right renal upper pole calculus. No hydronephrosis. 3.  Aortic Atherosclerosis (ICD10-I70.0). Electronically Signed   By: Vanetta Chou M.D.   On: 02/23/2024 10:45   US  PELVIS (TRANSABDOMINAL ONLY) Result Date: 02/21/2024 EXAM: PELVIC ULTRASOUND TECHNIQUE: Transabdominal pelvic duplex ultrasound using B-mode/gray scaled imaging without Doppler spectral analysis and color flow was obtained. COMPARISON: CT abdomen/pelvis dated 01/25/2024 CLINICAL HISTORY: Vaginal bleeding FINDINGS: ULTRASOUND FINDINGS: UTERUS: Uterus measures 6.8 x 3.5 x 6.3 cm (calculated volume 78 ml). ENDOMETRIAL STRIPE: Endometrial complex measures 5 mm. RIGHT OVARY: Right ovary is not discretely visualized. LEFT OVARY: Left ovary is not discretely visualized. FREE FLUID: No free fluid. LIMITATIONS/ARTIFACTS: Transvaginal ultrasound could not be performed  due to patient discomfort. IMPRESSION: 1. No acute findings. 2. Bilateral ovaries not discretely visualized. Electronically signed by: Pinkie Pebbles MD 02/21/2024 03:49 AM EDT RP Workstation: [id number]   US  EKG SITE RITE Result Date: 02/15/2024 If Site Rite image not attached, placement could not be confirmed due to current cardiac rhythm.  ECHOCARDIOGRAM COMPLETE Result Date: 02/15/2024    ECHOCARDIOGRAM REPORT   Patient Name:   [name] Date of Exam: 02/15/2024 Medical Rec #:  [id number]     Height:       69.0 in Accession #:    [id number]    Weight:  330.7 lb Date of Birth:  Dec 18, 1970     BSA:          2.558 m Patient Age:    53 years      BP:           134/77 mmHg Patient Gender: F             HR:           103 bpm. Exam Location:  Inpatient Procedure: 2D Echo, Cardiac Doppler, Color Doppler and Intracardiac            Opacification Agent (Both Spectral and Color Flow Doppler were            utilized during procedure). REPORT CONTAINS CRITICAL RESULT Indications:    R07.9* Chest pain, unspecified  History:        Patient has prior history of Echocardiogram examinations, most                 recent 10/24/2022. CHF, Signs/Symptoms:Bacteremia and Altered                 Mental Status; Risk Factors:Diabetes and Dyslipidemia.  Sonographer:    Ellouise Mose RDCS Referring Phys: [id number] VISHAL R PATEL  Sonographer Comments: Technically difficult study due to poor echo windows and patient is obese. Image acquisition challenging due to patient body habitus. IMPRESSIONS  1. Wall motion abnormalities in the LAD distribution. Either represents large, wrap around LAD infarct versus takotsubo cardiomyopathy. Left ventricular ejection fraction, by estimation, is 35 to 40%. The left ventricle has moderately decreased function. The left ventricle demonstrates regional wall motion abnormalities (see scoring diagram/findings for description). The left ventricular internal cavity size was severely dilated. Left  ventricular diastolic parameters are consistent with Grade I  diastolic dysfunction (impaired relaxation).  2. Right ventricular systolic function is normal. The right ventricular size is normal.  3. Left atrial size was mildly dilated.  4. Right atrial size was mildly dilated.  5. The mitral valve is normal in structure. Trivial mitral valve regurgitation. No evidence of mitral stenosis.  6. The aortic valve is tricuspid. Aortic valve regurgitation is not visualized. No aortic stenosis is present. Comparison(s): The left ventricular function is significantly worse. Conclusion(s)/Recommendation(s): No left ventricular mural or apical thrombus/thrombi. FINDINGS  Left Ventricle: Wall motion abnormalities in the LAD distribution. Either represents large, wrap around LAD infarct versus takotsubo cardiomyopathy. Left ventricular ejection fraction, by estimation, is 35 to 40%. The left ventricle has moderately decreased function. The left ventricle demonstrates regional wall motion abnormalities. Definity  contrast agent was given IV to delineate the left ventricular endocardial borders. The left ventricular internal cavity size was severely dilated. There is no left ventricular hypertrophy. Left ventricular diastolic parameters are consistent with Grade I diastolic dysfunction (impaired relaxation).  LV Wall Scoring: The apex is akinetic. The mid and distal anterior wall, mid and distal lateral wall, mid and distal inferior wall, mid anterolateral segment, mid inferoseptal segment, and apical septal segment are hypokinetic. The anterior septum, basal inferolateral segment, basal anterolateral segment, basal anterior segment, basal inferior segment, and basal inferoseptal segment are normal. Right Ventricle: The right ventricular size is normal. No increase in right ventricular wall thickness. Right ventricular systolic function is normal. Left Atrium: Left atrial size was mildly dilated. Right Atrium: Right atrial size  was mildly dilated. Pericardium: There is no evidence of pericardial effusion. Mitral Valve: The mitral valve is normal in structure. Trivial mitral valve regurgitation. No evidence of mitral valve stenosis. Tricuspid Valve:  The tricuspid valve is normal in structure. Tricuspid valve regurgitation is mild . No evidence of tricuspid stenosis. Aortic Valve: The aortic valve is tricuspid. Aortic valve regurgitation is not visualized. No aortic stenosis is present. Pulmonic Valve: The pulmonic valve was normal in structure. Pulmonic valve regurgitation is not visualized. No evidence of pulmonic stenosis. Aorta: The aortic root and ascending aorta are structurally normal, with no evidence of dilitation. IAS/Shunts: No atrial level shunt detected by color flow Doppler.  LEFT VENTRICLE PLAX 2D LVIDd:         5.55 cm      Diastology LVIDs:         4.10 cm      LV e' medial:    13.80 cm/s LV PW:         1.20 cm      LV E/e' medial:  5.7 LV IVS:        1.10 cm      LV e' lateral:   4.57 cm/s LVOT diam:     2.30 cm      LV E/e' lateral: 17.1 LV SV:         83 LV SV Index:   32 LVOT Area:     4.15 cm  LV Volumes (MOD) LV vol d, MOD A2C: 133.0 ml LV vol d, MOD A4C: 235.0 ml LV vol s, MOD A2C: 83.3 ml LV vol s, MOD A4C: 136.0 ml LV SV MOD A2C:     49.7 ml LV SV MOD A4C:     235.0 ml LV SV MOD BP:      80.5 ml RIGHT VENTRICLE             IVC RV S prime:     16.90 cm/s  IVC diam: 1.90 cm TAPSE (M-mode): 2.5 cm LEFT ATRIUM             Index        RIGHT ATRIUM           Index LA diam:        3.60 cm 1.41 cm/m   RA Area:     19.10 cm LA Vol (A2C):   70.1 ml 27.40 ml/m  RA Volume:   53.90 ml  21.07 ml/m LA Vol (A4C):   44.8 ml 17.51 ml/m LA Biplane Vol: 57.9 ml 22.63 ml/m  AORTIC VALVE LVOT Vmax:   121.00 cm/s LVOT Vmean:  85.100 cm/s LVOT VTI:    0.199 m  AORTA Ao Root diam: 2.85 cm Ao Asc diam:  3.30 cm MITRAL VALVE                TRICUSPID VALVE MV Area (PHT): 4.74 cm     TR Peak grad:   21.5 mmHg MV Decel Time: 160 msec      TR Vmax:        232.00 cm/s MV E velocity: 78.05 cm/s MV A velocity: 110.00 cm/s  SHUNTS MV E/A ratio:  0.71         Systemic VTI:  0.20 m                             Systemic Diam: 2.30 cm Morene Brownie Electronically signed by Morene Brownie Signature Date/Time: 02/15/2024/12:50:16 PM    Final    US  RENAL Result Date: 02/15/2024 EXAM: RETROPERITONEAL ULTRASOUND OF THE KIDNEYS AND URINARY BLADDER TECHNIQUE: Real-time ultrasonography of the retroperitoneum including the kidneys and urinary bladder was  performed. COMPARISON: CT abdomen and pelvis dated 01/25/2024. CLINICAL HISTORY: Acute kidney injury (HCC). FINDINGS: RIGHT KIDNEY: The right kidney measures 10.6 x 5.4 x 4.1 cm with a volume of 120 ml. The right kidney demonstrates normal echogenicity. No hydronephrosis or mass. LEFT KIDNEY: The left kidney measures 10.3 x 5.1 x 5.7 cm with a volume of 155 ml. No left hydronephrosis or mass. Increased cortical echogenicity in the left kidney which can be seen with medical renal disease. BLADDER: Unremarkable appearance of the bladder. IMPRESSION: 1. Increased cortical echogenicity in the left kidney, suggestive of medical renal disease. 2. No hydronephrosis or mass in either kidney. Electronically signed by: Norman Gatlin MD 02/15/2024 01:55 AM EDT RP Workstation: [id number]   CT HEAD WO CONTRAST Result Date: 02/14/2024 CLINICAL DATA:  Altered mental status EXAM: CT HEAD WITHOUT CONTRAST TECHNIQUE: Contiguous axial images were obtained from the base of the skull through the vertex without intravenous contrast. RADIATION DOSE REDUCTION: This exam was performed according to the departmental dose-optimization program which includes automated exposure control, adjustment of the mA and/or kV according to patient size and/or use of iterative reconstruction technique. COMPARISON:  10/23/2022 FINDINGS: Brain: No evidence of acute infarction, hemorrhage, hydrocephalus, extra-axial collection or mass lesion/mass  effect. Vascular: No hyperdense vessel or unexpected calcification. Skull: Normal. Negative for fracture or focal lesion. Sinuses/Orbits: No acute finding. Other: None. IMPRESSION: No acute abnormality noted. Electronically Signed   By: Oneil Devonshire M.D.   On: 02/14/2024 21:59   DG Chest Port 1 View Result Date: 02/14/2024 CLINICAL DATA:  Altered mental status. EXAM: PORTABLE CHEST 1 VIEW COMPARISON:  Chest x-ray 02/10/2024 FINDINGS: Enlarged cardiopericardial silhouette vascular congestion. Question trace edema. Film is rotated to left. Films are also under penetrated. No pneumothorax. No large effusion. Overlapping cardiac leads. IMPRESSION: Limited x-ray. Enlarged cardiac silhouette with vascular congestion. Question trace edema. Electronically Signed   By: Ranell Bring M.D.   On: 02/14/2024 19:41   DG Chest 2 View Result Date: 02/10/2024 CLINICAL DATA:  Peripheral swelling EXAM: CHEST - 2 VIEW COMPARISON:  01/24/2024 FINDINGS: Cardiac shadow is enlarged. Increasing pleural effusions are seen with central pulmonary vascular congestion. No bony abnormality is noted. IMPRESSION: Changes of CHF as described. Electronically Signed   By: Oneil Devonshire M.D.   On: 02/10/2024 00:41    Microbiology: Results for orders placed or performed during the hospital encounter of 02/14/24  Culture, blood (Routine X 2) w Reflex to ID Panel     Status: Abnormal   Collection Time: 02/14/24  8:11 PM   Specimen: BLOOD  Result Value Ref Range Status   Specimen Description   Final    BLOOD RIGHT ANTECUBITAL Performed at [facility], [address], [place], [place] [address]    Special Requests   Final    BOTTLES DRAWN AEROBIC AND ANAEROBIC Blood Culture adequate volume Performed at [facility], [address], [place], [place] [address]    Culture  Setup Time   Final    GRAM POSITIVE RODS AEROBIC BOTTLE ONLY CRITICAL RESULT CALLED TO, READ BACK BY AND VERIFIED WITH: PHARMD  D WOFFORD [id number] AT 1005 BY CM    Culture (A)  Final    CORYNEBACTERIUM MINUTISSIMUM Standardized susceptibility testing for this organism is not available. Performed at [facility], [address] [place], [place] [address]    Report Status 02/18/2024 FINAL  Final  Urine Culture (for pregnant, neutropenic or urologic patients or patients with an indwelling urinary  catheter)     Status: Abnormal   Collection Time: 02/14/24  8:33 PM   Specimen: Urine, Clean Catch  Result Value Ref Range Status   Specimen Description   Final    URINE, CLEAN CATCH Performed at [facility], [address], [place], [place] [address]    Special Requests   Final    NONE Performed at [facility], [address], [place], [place] [address]    Culture 20,000 COLONIES/mL ESCHERICHIA COLI (A)  Final   Report Status 02/16/2024 FINAL  Final   Organism ID, Bacteria ESCHERICHIA COLI (A)  Final      Susceptibility   Escherichia coli - MIC*    AMPICILLIN  <=2 SENSITIVE Sensitive     CEFAZOLIN <=4 SENSITIVE Sensitive     CEFEPIME  <=0.12 SENSITIVE Sensitive     CEFTRIAXONE  <=0.25 SENSITIVE Sensitive     CIPROFLOXACIN  <=0.25 SENSITIVE Sensitive     GENTAMICIN <=1 SENSITIVE Sensitive     IMIPENEM <=0.25 SENSITIVE Sensitive     NITROFURANTOIN <=16 SENSITIVE Sensitive     TRIMETH/SULFA <=20 SENSITIVE Sensitive     AMPICILLIN /SULBACTAM <=2 SENSITIVE Sensitive     PIP/TAZO <=4 SENSITIVE Sensitive ug/mL    * 20,000 COLONIES/mL ESCHERICHIA COLI  Resp panel by RT-PCR (RSV, Flu A&B, Covid) Anterior Nasal Swab     Status: None   Collection Time: 02/14/24 10:03 PM   Specimen: Anterior Nasal Swab  Result Value Ref Range Status   SARS Coronavirus 2 by RT PCR NEGATIVE NEGATIVE Final    Comment: (NOTE) SARS-CoV-2 target nucleic acids are NOT DETECTED.  The SARS-CoV-2 RNA is generally detectable in upper respiratory specimens during the acute phase of infection. The  lowest concentration of SARS-CoV-2 viral copies this assay can detect is 138 copies/mL. A negative result does not preclude SARS-Cov-2 infection and should not be used as the sole basis for treatment or other patient management decisions. A negative result may occur with  improper specimen collection/handling, submission of specimen other than nasopharyngeal swab, presence of viral mutation(s) within the areas targeted by this assay, and inadequate number of viral copies(<138 copies/mL). A negative result must be combined with clinical observations, patient history, and epidemiological information. The expected result is Negative.  Fact Sheet for Patients:  BloggerCourse.com  Fact Sheet for Healthcare Providers:  SeriousBroker.it  This test is no t yet approved or cleared by the [place]  FDA and  has been authorized for detection and/or diagnosis of SARS-CoV-2 by FDA under an Emergency Use Authorization (EUA). This EUA will remain  in effect (meaning this test can be used) for the duration of the COVID-19 declaration under Section 564(b)(1) of the Act, 21 U.S.C.section 360bbb-3(b)(1), unless the authorization is terminated  or revoked sooner.       Influenza A by PCR NEGATIVE NEGATIVE Final   Influenza B by PCR NEGATIVE NEGATIVE Final    Comment: (NOTE) The Xpert Xpress SARS-CoV-2/FLU/RSV plus assay is intended as an aid in the diagnosis of influenza from Nasopharyngeal swab specimens and should not be used as a sole basis for treatment. Nasal washings and aspirates are unacceptable for Xpert Xpress SARS-CoV-2/FLU/RSV testing.  Fact Sheet for Patients: BloggerCourse.com  Fact Sheet for Healthcare Providers: SeriousBroker.it  This test is not yet approved or cleared by the [place]  FDA and has been authorized for detection and/or diagnosis of SARS-CoV-2 by FDA under  an Emergency Use Authorization (EUA). This EUA will remain in effect (meaning this test can be used) for  the duration of the COVID-19 declaration under Section 564(b)(1) of the Act, 21 U.S.C. section 360bbb-3(b)(1), unless the authorization is terminated or revoked.     Resp Syncytial Virus by PCR NEGATIVE NEGATIVE Final    Comment: (NOTE) Fact Sheet for Patients: BloggerCourse.com  Fact Sheet for Healthcare Providers: SeriousBroker.it  This test is not yet approved or cleared by the [place]  FDA and has been authorized for detection and/or diagnosis of SARS-CoV-2 by FDA under an Emergency Use Authorization (EUA). This EUA will remain in effect (meaning this test can be used) for the duration of the COVID-19 declaration under Section 564(b)(1) of the Act, 21 U.S.C. section 360bbb-3(b)(1), unless the authorization is terminated or revoked.  Performed at [facility], [address], [place], [place] [address]   Culture, blood (Routine X 2) w Reflex to ID Panel     Status: None   Collection Time: 02/15/24  9:53 AM   Specimen: BLOOD  Result Value Ref Range Status   Specimen Description   Final    BLOOD SITE NOT SPECIFIED Performed at [facility], [address], [place], [place] [address]    Special Requests   Final    BOTTLES DRAWN AEROBIC ONLY Blood Culture results may not be optimal due to an inadequate volume of blood received in culture bottles Performed at [facility], [address] [place], [place] [address]    Culture   Final    NO GROWTH 5 DAYS Performed at [facility], [address], [place], [place] [address]    Report Status 02/20/2024 FINAL  Final  MRSA Next Gen by PCR, Nasal     Status: None   Collection Time: 02/15/24 11:29 AM   Specimen: Nasal Mucosa; Nasal Swab  Result Value Ref Range Status   MRSA by PCR Next Gen NOT DETECTED NOT DETECTED Final     Comment: (NOTE) The GeneXpert MRSA Assay (FDA approved for NASAL specimens only), is one component of a comprehensive MRSA colonization surveillance program. It is not intended to diagnose MRSA infection nor to guide or monitor treatment for MRSA infections. Test performance is not FDA approved in patients less than 31 years old. Performed at [facility], [address], [place], [place] [address]    *Note: Due to a large number of results and/or encounters for the requested time period, some results have not been displayed. A complete set of results can be found in Results Review.    Labs: CBC: Recent Labs  Lab 02/23/24 1124 02/24/24 0520 02/25/24 0511 02/26/24 0504 02/27/24 0502  WBC 12.3* 12.5* 10.9* 11.8* 10.5  NEUTROABS 8.0* 7.6 6.4 6.9 5.7  HGB 10.2* 9.7* 10.0* 10.0* 9.8*  HCT 32.6* 30.8* 31.9* 31.9* 31.2*  MCV 92.4 91.4 89.6 92.2 92.0  PLT 223 223 216 241 247   Basic Metabolic Panel: Recent Labs  Lab 02/23/24 1124 02/24/24 0518 02/24/24 0520 02/25/24 0511 02/26/24 0504 02/27/24 0502  NA 137  --  138 138 138 137  K 3.5  --  3.3* 4.2 3.9 3.9  CL 98  --  98 100 99 98  CO2 29  --  29 30 29 29   GLUCOSE 204*  --  139* 108* 86 89  BUN 20  --  23* 23* 21* 20  CREATININE 0.91  --  1.12* 1.03* 1.08* 1.16*  CALCIUM  8.8*  --  8.8* 9.0 8.9 8.9  MG  --  1.6*  --   --   --   --  Liver Function Tests: No results for input(s): AST, ALT, ALKPHOS, BILITOT, PROT, ALBUMIN  in the last 168 hours. CBG: Recent Labs  Lab 02/28/24 1121 02/28/24 1641 02/28/24 2012 02/29/24 0728 02/29/24 1134  GLUCAP 170* 132* 104* 89 128*    Discharge time spent: greater than 30 minutes.  Signed: [name], MD [facility] 02/29/2024

## 2024-02-29 NOTE — Progress Notes (Incomplete)
 [facility]  PROGRESS NOTE  [name] FMW:[id number] DOB: 16-Apr-1971 DOA: 02/14/2024 PCP: [name], MD   Brief HPI:   53 y.o. female with medical history significant for chronic HFpEF, history of VTE on Eliquis , insulin -dependent T2DM, SLE, Sjogren's syndrome, HTN, gastroparesis, hypothyroidism, anemia, chronic back pain, morbid obesity who presented to the ED for evaluation of altered mental status  Patient recently admitted at [facility] from 6/30-7/8 for intractable nausea and vomiting related to gastroparesis. She had associated AKI. She was managed with antiemetics with improvement in nausea  and vomiting.    Assessment/Plan:   Acute encephalopathy Resolved.  Suspect from polypharmacy.  CT head negative.  Therapy eval recommending SNF. But patient wants to go home.     New onset systolic CHF/ Acute HRrEF/ elevated troponins Cardiology was consulted, ON torsemide  20 mg daily,  entresto , metoprolol  succinate 25 mg daily  and spironolactone  25 mg dialy,   Echo this admission shows  showed EF of 35 to 40% suspect stress cardiomyopathy.  Limited echocardiogram showed improvement in the LVEF, likely stress cardiomyopathy.  Cardiology defer inpatient cath. Recommend outpatient follow up with cardiology for further work up.   Hypotension -Blood pressure has been soft, patient feeling dizziness intermittently -Will consult cardiology for adjustment of medications -Patient started on multiple GDMT meds per cardiology for heart failure as above     Hypokalemia Replete   SIRS/ Pressure injury to the left upper buttocks From Pressure injury to left upper inner buttocks unstageable  She was initially started on broad spectrum IV antibiotics and narrowed to oral doxycycline . Complete the course of doxycycline .  Afebrile, with improvement in wbc count. Procalcitonin is negative.  She also received 3 days of IV cefepime  for e coli in the urine.  Wound care  consulted for buttocks wound.  She also has a foley catheter to prevent soiling of the pressure injury from urine and stool. She wants to keep it until discharge.  Patient requested for outpatient wound care center follow up .  -No home health agency accepted patient Patient's caregiver will be instructed on dressing changes Likely discharge home in a.m. Awaiting equipment to be delivered-to home patient  Diabetes mellitus type 2 - Continue Lantus , sliding scale insulin  with NovoLog  - CBG well-controlled      AKI - creatinine on admission is 2, improved with diuresis.  US  renal is negative for hydronephrosis.  Creatinine around 1 and stable.      H/o DVT On eliquis .    Body mass index is 49.29 kg/m. Obesity class III Recommend outpatient follow up with bariatric sugery.         Medications     apixaban   2.5 mg Oral BID   Chlorhexidine  Gluconate Cloth  6 each Topical Daily   collagenase    Topical Daily   empagliflozin   10 mg Oral Daily   feeding supplement  237 mL Oral BID BM   insulin  aspart  0-20 Units Subcutaneous TID WC   insulin  aspart  0-5 Units Subcutaneous QHS   insulin  aspart  5 Units Subcutaneous TID WC   insulin  glargine-yfgn  30 Units Subcutaneous BID   levothyroxine   50 mcg Oral Q0600   metoprolol  succinate  25 mg Oral Daily   pantoprazole   40 mg Oral Daily   polyethylene glycol  17 g Oral Daily   rosuvastatin   20 mg Oral Daily   senna-docusate  2 tablet Oral BID   sodium chloride  flush  10-40 mL Intracatheter Q12H  sodium chloride  flush  3 mL Intravenous Q12H   spironolactone   12.5 mg Oral Daily     Data Reviewed:   CBG:  Recent Labs  Lab 02/27/24 2011 02/28/24 0741 02/28/24 1641 02/28/24 2012 02/29/24 0728  GLUCAP 98 180* 132* 104* 89    SpO2: 97 %    Vitals:   02/28/24 1647 02/28/24 1931 02/29/24 0500 02/29/24 0514  BP: 101/66 104/68  (!) 119/50  Pulse: 94 (!) 102  94  Resp:  18  18  Temp:  99.5 F (37.5 C)  98.8 F (37.1  C)  TempSrc:  Oral  Oral  SpO2:  99%  97%  Weight:   (!) 150.6 kg   Height:          Data Reviewed:  Basic Metabolic Panel: Recent Labs  Lab 02/23/24 1124 02/24/24 0518 02/24/24 0520 02/25/24 0511 02/26/24 0504 02/27/24 0502  NA 137  --  138 138 138 137  K 3.5  --  3.3* 4.2 3.9 3.9  CL 98  --  98 100 99 98  CO2 29  --  29 30 29 29   GLUCOSE 204*  --  139* 108* 86 89  BUN 20  --  23* 23* 21* 20  CREATININE 0.91  --  1.12* 1.03* 1.08* 1.16*  CALCIUM  8.8*  --  8.8* 9.0 8.9 8.9  MG  --  1.6*  --   --   --   --     CBC: Recent Labs  Lab 02/23/24 1124 02/24/24 0520 02/25/24 0511 02/26/24 0504 02/27/24 0502  WBC 12.3* 12.5* 10.9* 11.8* 10.5  NEUTROABS 8.0* 7.6 6.4 6.9 5.7  HGB 10.2* 9.7* 10.0* 10.0* 9.8*  HCT 32.6* 30.8* 31.9* 31.9* 31.2*  MCV 92.4 91.4 89.6 92.2 92.0  PLT 223 223 216 241 247    LFT No results for input(s): AST, ALT, ALKPHOS, BILITOT, PROT, ALBUMIN  in the last 168 hours.   Antibiotics: Anti-infectives (From admission, onward)    Start     Dose/Rate Route Frequency Ordered Stop   02/20/24 1715  erythromycin  (E-MYCIN ) tablet 250 mg  Status:  Discontinued        250 mg Oral 3 times daily with meals & bedtime 02/20/24 1625 02/28/24 0756   02/19/24 1330  doxycycline  (VIBRA -TABS) tablet 100 mg  Status:  Discontinued        100 mg Oral Every 12 hours 02/19/24 1232 02/26/24 1159   02/18/24 1000  vancomycin  (VANCOREADY) IVPB 1250 mg/250 mL  Status:  Discontinued        1,250 mg 166.7 mL/hr over 90 Minutes Intravenous Every 12 hours 02/18/24 0849 02/18/24 1320   02/17/24 2000  vancomycin  (VANCOREADY) IVPB 1500 mg/300 mL  Status:  Discontinued        1,500 mg 150 mL/hr over 120 Minutes Intravenous Every 12 hours 02/17/24 1101 02/18/24 0849   02/16/24 2000  vancomycin  (VANCOREADY) IVPB 1250 mg/250 mL  Status:  Discontinued        1,250 mg 166.7 mL/hr over 90 Minutes Intravenous Every 12 hours 02/16/24 0932 02/17/24 1101   02/15/24 2200   vancomycin  (VANCOCIN ) IVPB 1000 mg/200 mL premix  Status:  Discontinued        1,000 mg 200 mL/hr over 60 Minutes Intravenous Every 24 hours 02/14/24 2345 02/15/24 0958   02/15/24 2200  vancomycin  (VANCOREADY) IVPB 1750 mg/350 mL  Status:  Discontinued        1,750 mg 175 mL/hr over 120 Minutes Intravenous Every 24 hours 02/15/24  9041 02/16/24 0932   02/15/24 1000  ceFEPIme  (MAXIPIME ) 2 g in sodium chloride  0.9 % 100 mL IVPB  Status:  Discontinued        2 g 200 mL/hr over 30 Minutes Intravenous Every 8 hours 02/15/24 0958 02/19/24 1232   02/15/24 0900  ceFEPIme  (MAXIPIME ) 2 g in sodium chloride  0.9 % 100 mL IVPB  Status:  Discontinued        2 g 200 mL/hr over 30 Minutes Intravenous Every 12 hours 02/14/24 2345 02/15/24 0958   02/14/24 2345  metroNIDAZOLE  (FLAGYL ) IVPB 500 mg  Status:  Discontinued        500 mg 100 mL/hr over 60 Minutes Intravenous 2 times daily 02/14/24 2334 02/19/24 1232   02/14/24 2115  vancomycin  (VANCOREADY) IVPB 2000 mg/400 mL        2,000 mg 200 mL/hr over 120 Minutes Intravenous  Once 02/14/24 2110 02/15/24 0238   02/14/24 2115  ceFEPIme  (MAXIPIME ) 2 g in sodium chloride  0.9 % 100 mL IVPB        2 g 200 mL/hr over 30 Minutes Intravenous  Once 02/14/24 2110 02/14/24 2235        DVT prophylaxis: Apixaban   Code Status: Full code  Family Communication: No family at bedside   CONSULTS    Subjective     Objective    Physical Examination:   Status is: Inpatient:             [name]   [facility] If 7PM-7AM, please contact night-coverage at [email or web address], Office  [phone]   02/29/2024, 7:47 AM  LOS: 15 days

## 2024-02-29 NOTE — Progress Notes (Signed)
 Patient's Blood Pressure 95/58 Pulse 96. Dr. Drusilla and Cardiology Dr.Ross notified. Maintain current plan of care

## 2024-02-29 NOTE — Progress Notes (Signed)
 Mobility Specialist - Progress Note   02/29/24 1518  Mobility  Activity Pivoted/transferred to/from [facility]  Level of Assistance +2 (takes two people)  Nurse, learning disability of Motion/Exercises Active Assistive  Activity Response Tolerated well  Mobility Referral Yes  Mobility visit 1 Mobility  Mobility Specialist Start Time (ACUTE ONLY) 1505  Mobility Specialist Stop Time (ACUTE ONLY) 1518  Mobility Specialist Time Calculation (min) (ACUTE ONLY) 13 min   NT requested assistance to transfer pt to [facility]. Pt returned to bed with all needs met. NT in room.  Erminio Leos,  Mobility Specialist Can be reached via Secure Chat

## 2024-02-29 NOTE — Progress Notes (Signed)
 Patient to be discharged to home today via ambulance. All Discharge teaching/instruction including all discharge Medications and schedules for these Medications reviewed with the Patient. Discharge AVS with Patient along with Medications delivered to bedside

## 2024-02-29 NOTE — Progress Notes (Signed)
 Discharge medications delivered to patient at the bedside [facility]

## 2024-02-29 NOTE — Progress Notes (Signed)
 Mobility Specialist - Progress Note   02/29/24 1052  Mobility  Activity Ambulated with assistance  Level of Assistance +2 (takes two people)  Press photographer wheel walker  Distance Ambulated (ft) 4 ft  Range of Motion/Exercises Active Assistive  Activity Response Tolerated well  Mobility Referral Yes  Mobility visit 1 Mobility  Mobility Specialist Start Time (ACUTE ONLY) 1030  Mobility Specialist Stop Time (ACUTE ONLY) 1052  Mobility Specialist Time Calculation (min) (ACUTE ONLY) 22 min   Pt was found in bed and agreeable to mobilize. C/o LLE feeling weird. Able to do x3 STS and took 2 steps forward and backwards. At EOS returned to bed with all needs met. Call bell in reach.  Erminio Leos,  Mobility Specialist Can be reached via Secure Chat

## 2024-02-29 NOTE — Progress Notes (Signed)
 Progress Note  Patient Name: [name] Date of Encounter: 02/29/2024 [facility] Cardiologist: Gordy Bergamo, MD   Interval Summary   Pt breathing OK  Denies dizziness  (in bed) Vital Signs Vitals:   02/28/24 1931 02/29/24 0500 02/29/24 0514 02/29/24 0948  BP: 104/68  (!) 119/50 117/60  Pulse: (!) 102  94 92  Resp: 18  18   Temp: 99.5 F (37.5 C)  98.8 F (37.1 C) 98 F (36.7 C)  TempSrc: Oral  Oral Oral  SpO2: 99%  97%   Weight:  (!) 150.6 kg    Height:        Intake/Output Summary (Last 24 hours) at 02/29/2024 1151 Last data filed at 02/29/2024 1100 Gross per 24 hour  Intake 480 ml  Output 500 ml  Net -20 ml      02/29/2024    5:00 AM 02/28/2024    5:00 AM 02/27/2024    5:07 AM  Last 3 Weights  Weight (lbs) 332 lb 0.2 oz 332 lb 0.2 oz 326 lb 4.5 oz  Weight (kg) 150.6 kg 150.6 kg 148 kg      Telemetry/ECG  Normal sinus rhythm with heart rates in the 80s to 100s- Personally Reviewed  Physical Exam  GEN: Morbidly obese 53 yo I NAD  Neck  NO obvious  JVD Cardiac: RRR, no murmurs Respiratory: Clear to auscultation bilaterally. GI: Soft, obese  Nontender  Ext  No LE edema   Assessment & Plan  [name] is a 53 y.o. female with a hx of HFrEF, normal coronary arteries (2017) Sjogren's syndrome, SLE, morbid obesity, OHS, OSA on CPAP, uncontrolled type 2 diabetes, gastroparesis, hypertension, history of CVA in 2014, hepatic adenoma s/p embolization in 2021, and history of PE on chronic anticoagulation with Eliquis  who was initially seen 02/15/2024 for elevated troponin.  Cardiology signed off on the patient.  Patient was added back onto cardiology rounding list.  Acute HFrEF  Hypertension  EF previously as low as 30-35%, LHC in 2017 showed normal coronaries and was previously treated as nonischemic cardiomyopathy. Echo 09/2022 showed improved EF 55-60%, and mild LVH. Echo earlier this admission on 02/15/24 showed reduced LVEF 35-40%, wall motion abnormalities  consistent with Takotsubo cardiomyopathy vs LAD infarct.  A repeat limited echo was done on 02/24/2024 and showed a normal LVEF of 65 to 70%. Patient denied any active or prior chest pain and EKG showed no ischemic changes.  Because of lack of symptoms and improved LVEF on echo no ischemic evaluation was done.  The patient was recommended to be considered for ischemic evaluation if she has chest pain or repeatedly elevated troponins GDMT initially held due to hypotension and AKI but was restarted after these resolved.  Has been diuresed and weight is down about 20 pounds. blood pressures have hypotensive recently as low as 84/48.  Most recent BP 99/56. Yesterday meds were stopped/decreased    Spironolactne decreased to 12.5 mg  Entresto  stopped     Toprol  continued 25 mg  Torsemide  to be dose prn for wt gain  I would add low dose losartan  12.5  mg   I think she should tolerate this      Will follow response as outpt   Elevated high-sensitivity troponin level 857 > 1,370 > 1,210 Denies chest pain, shortness of breath, orthopnea. Denies any symptoms with exertion but does live a sedentary lifestyle. LHC 2017 showed normal coronaries however with diabetes with complications including gastroparesis. A1c 7.3%  Defer cath  as above. Not on ASA due to Eliquis  use for history of DVT   Hyperlipidemia Continue Crestor  20 mg daily    DM   A1C 7.3   Continue to try to improve    OK to d/c from cardiac standpoint   Will mke sure she has outpt follow up scheduled  Will sign off    For questions or updates, please contact [facility] Please consult [email or web address] for contact info under       Signed, Vina Gull, MD

## 2024-02-29 NOTE — TOC CM/SW Note (Signed)
 Patient can independently operate and adjust bed controls.

## 2024-02-29 NOTE — TOC Transition Note (Addendum)
 Transition of Care [facility]) - Discharge Note   Patient Details  Name: [name] MRN: [id number] Date of Birth: 1971-06-30  Transition of [facility]) CM/SW Contact:  Bascom Service, RN Phone Number: 02/29/2024, 9:28 AM   Clinical Narrative: Patient agreed to d/c plan.Patient agreed to otpt PT-referral sent to [facility]. [employer] rep Mitch arranging delivery of hospital bed,air overlay gel mattress to home prior d/c by PTAR-also [employer] will f/u on rollator not functioning @ home.Patient's caregiver have been taught wound care. No HHC agency to accept. PTAR @ d/c. No further CM needs.  -3:32p-ordered bariatric 3n1 [employer] [place] they will deliver to patient's home. Nsg to call PTAR when ready.     Final next level of care: OP Rehab Barriers to Discharge: No Barriers Identified   Patient Goals and CMS Choice Patient states their goals for this hospitalization and ongoing recovery are:: Home CMS Medicare.gov Compare Post Acute Care list provided to:: Patient Choice offered to / list presented to : Patient [place] ownership interest in [facility].provided to:: Patient    Discharge Placement                       Discharge Plan and Services Additional resources added to the After Visit Summary for     Discharge Planning Services: CM Consult Post Acute Care Choice: Resumption of Svcs/PTA Provider          DME Arranged: Hospital bed, Air overlay mattress DME Agency: [employer] Date DME Agency Contacted: 02/29/24 Time DME Agency Contacted: [phone] Representative spoke with at DME Agency: [place] HH Arranged: Refused SNF (No HHC agency to accept d/t insurance/staffing)          Social Drivers of Health (SDOH) Interventions SDOH Screenings   Food Insecurity: No Food Insecurity (02/18/2024)  Recent Concern: Food Insecurity - Medium Risk (02/07/2024)   Received from [facility]  Housing: Low Risk  (02/18/2024)  Recent Concern: Housing - High Risk  (02/15/2024)  Transportation Needs: No Transportation Needs (02/18/2024)  Utilities: At Risk (02/18/2024)  Financial Resource Strain: Low Risk  (03/30/2022)   Received from [facility], [facility] visits prior to 10/01/2022.  Social Connections: Unknown (12/11/2022)   Received from [facility]  Stress: No Stress Concern Present (01/30/2024)   Received from [facility]  Tobacco Use: Medium Risk (02/20/2024)     Readmission Risk Interventions    02/18/2024    4:43 PM 04/27/2023    3:50 PM  Readmission Risk Prevention Plan  Transportation Screening Complete Complete  PCP or Specialist Appt within 3-5 Days  Complete  HRI or Home Care Consult  Complete  Social Work Consult for Recovery Care Planning/Counseling  Complete  Palliative Care Screening  Not Applicable  Medication Review [facility]) Complete Referral to Pharmacy  PCP or Specialist appointment within 3-5 days of discharge Complete   HRI or Home Care Consult Complete   SW Recovery Care/Counseling Consult Complete   Palliative Care Screening Not Applicable   Skilled Nursing Facility Not Applicable

## 2024-03-01 ENCOUNTER — Other Ambulatory Visit (HOSPITAL_BASED_OUTPATIENT_CLINIC_OR_DEPARTMENT_OTHER): Payer: Self-pay

## 2024-03-13 ENCOUNTER — Ambulatory Visit: Admitting: Physician Assistant

## 2024-03-14 ENCOUNTER — Ambulatory Visit: Admitting: Cardiology

## 2024-03-14 ENCOUNTER — Telehealth: Payer: Self-pay | Admitting: Cardiology

## 2024-03-14 DIAGNOSIS — I10 Essential (primary) hypertension: Secondary | ICD-10-CM

## 2024-03-14 DIAGNOSIS — I5032 Chronic diastolic (congestive) heart failure: Secondary | ICD-10-CM

## 2024-03-14 DIAGNOSIS — I428 Other cardiomyopathies: Secondary | ICD-10-CM

## 2024-03-14 NOTE — Telephone Encounter (Signed)
 Called and spoke w patient.  She provided the wrong address to the transportation person today and [address].   They were not permitted to bring her once she realized the correct location.  She is now on the wait list for Dr. Ladona and rescheduled to October.  She is asking for Dr. Ladona to review her hospital discharge and permit her to get back on Bidil  and carvedilol .  She's continued on Entresto  because she didn't see that she was to hold that one as well.  Asked her for 3 most recent V/S.  She checks daily.   157/98, 138/87, 147/92 - HR 98-99.  Pt aware we will send message to Dr. Ladona and his nurse and call her with recommendations.  Will work to move up her appointment from Oct if possible.

## 2024-03-14 NOTE — Telephone Encounter (Signed)
 Pt c/o medication issue:  1. Name of Medication:   All medications  2. How are you currently taking this medication (dosage and times per day)?   3. Are you having a reaction (difficulty breathing--STAT)?   4. What is your medication issue?   Patient wants a call back to discuss current medications since being released from the hospital.

## 2024-03-15 ENCOUNTER — Other Ambulatory Visit (HOSPITAL_COMMUNITY): Payer: Self-pay

## 2024-03-15 MED ORDER — CARVEDILOL 25 MG PO TABS
25.0000 mg | ORAL_TABLET | Freq: Two times a day (BID) | ORAL | 3 refills | Status: AC
  Filled 2024-03-15: qty 180, 90d supply, fill #0

## 2024-03-15 MED ORDER — SACUBITRIL-VALSARTAN 97-103 MG PO TABS
1.0000 | ORAL_TABLET | Freq: Two times a day (BID) | ORAL | 2 refills | Status: AC
  Filled 2024-03-15: qty 60, 30d supply, fill #0

## 2024-03-15 NOTE — Telephone Encounter (Signed)
 ICD-10-CM   1. Chronic diastolic congestive heart failure (HCC)  I50.32 carvedilol  (COREG ) 25 MG tablet    sacubitril -valsartan  (ENTRESTO ) 97-103 MG    2. Essential hypertension  I10 carvedilol  (COREG ) 25 MG tablet    sacubitril -valsartan  (ENTRESTO ) 97-103 MG    3. Non-ischemic cardiomyopathy (HCC)  I42.8 carvedilol  (COREG ) 25 MG tablet    sacubitril -valsartan  (ENTRESTO ) 97-103 MG     No orders of the defined types were placed in this encounter.   Meds ordered this encounter  Medications   carvedilol  (COREG ) 25 MG tablet    Sig: Take 1 tablet (25 mg total) by mouth 2 (two) times daily.    Dispense:  180 tablet    Refill:  3   sacubitril -valsartan  (ENTRESTO ) 97-103 MG    Sig: Take 1 tablet by mouth 2 (two) times daily.    Dispense:  60 tablet    Refill:  2   Medications Discontinued During This Encounter  Medication Reason   traMADol  (ULTRAM ) 50 MG tablet No longer needed (for PRN medications)   metoprolol  succinate (TOPROL -XL) 25 MG 24 hr tablet Change in therapy

## 2024-03-15 NOTE — Telephone Encounter (Signed)
 I stopped her Metoprolol  and restarted Coreg  25 mg BID and Entresto  97/103 BID. She needs BMP in 2-3 weeks. Rx sent to [facility]

## 2024-03-16 ENCOUNTER — Other Ambulatory Visit (HOSPITAL_COMMUNITY): Payer: Self-pay

## 2024-03-18 ENCOUNTER — Encounter (HOSPITAL_BASED_OUTPATIENT_CLINIC_OR_DEPARTMENT_OTHER): Attending: General Surgery | Admitting: General Surgery

## 2024-03-18 ENCOUNTER — Other Ambulatory Visit: Payer: Self-pay | Admitting: *Deleted

## 2024-03-18 DIAGNOSIS — L89323 Pressure ulcer of left buttock, stage 3: Secondary | ICD-10-CM | POA: Diagnosis not present

## 2024-03-18 DIAGNOSIS — L304 Erythema intertrigo: Secondary | ICD-10-CM | POA: Insufficient documentation

## 2024-03-18 DIAGNOSIS — I10 Essential (primary) hypertension: Secondary | ICD-10-CM

## 2024-03-18 DIAGNOSIS — L89313 Pressure ulcer of right buttock, stage 3: Secondary | ICD-10-CM | POA: Insufficient documentation

## 2024-03-18 DIAGNOSIS — N183 Chronic kidney disease, stage 3 unspecified: Secondary | ICD-10-CM | POA: Diagnosis not present

## 2024-03-18 DIAGNOSIS — I5042 Chronic combined systolic (congestive) and diastolic (congestive) heart failure: Secondary | ICD-10-CM | POA: Insufficient documentation

## 2024-03-18 DIAGNOSIS — I5032 Chronic diastolic (congestive) heart failure: Secondary | ICD-10-CM

## 2024-03-18 DIAGNOSIS — E1122 Type 2 diabetes mellitus with diabetic chronic kidney disease: Secondary | ICD-10-CM | POA: Insufficient documentation

## 2024-03-18 DIAGNOSIS — I428 Other cardiomyopathies: Secondary | ICD-10-CM

## 2024-03-18 MED ORDER — ISOSORB DINITRATE-HYDRALAZINE 20-37.5 MG PO TABS: 1.0000 | ORAL_TABLET | Freq: Three times a day (TID) | ORAL | 3 refills | Status: DC

## 2024-03-18 NOTE — Telephone Encounter (Signed)
 Patient notified.  Prescription sent to [facility] on [address].  Patient will have lab work done in lab in our office

## 2024-03-18 NOTE — Addendum Note (Signed)
 Addended by: Veeda Virgo L on: 03/18/2024 05:53 PM   Modules accepted: Orders

## 2024-03-18 NOTE — Telephone Encounter (Signed)
 Reviewed with Dr Ladona and he would also like patient to resume Bidil  20/37.5 mg one tablet by mouth three times daily

## 2024-03-22 ENCOUNTER — Other Ambulatory Visit: Payer: Self-pay

## 2024-03-22 ENCOUNTER — Encounter (HOSPITAL_COMMUNITY): Payer: Self-pay

## 2024-03-22 ENCOUNTER — Emergency Department (HOSPITAL_COMMUNITY)
Admission: EM | Admit: 2024-03-22 | Discharge: 2024-03-22 | Disposition: A | Discharge status: Home / Self Care | Attending: Emergency Medicine | Admitting: Emergency Medicine

## 2024-03-22 ENCOUNTER — Emergency Department (HOSPITAL_COMMUNITY)

## 2024-03-22 DIAGNOSIS — Z794 Long term (current) use of insulin: Secondary | ICD-10-CM | POA: Insufficient documentation

## 2024-03-22 DIAGNOSIS — K3184 Gastroparesis: Secondary | ICD-10-CM | POA: Diagnosis not present

## 2024-03-22 DIAGNOSIS — Z7984 Long term (current) use of oral hypoglycemic drugs: Secondary | ICD-10-CM | POA: Diagnosis not present

## 2024-03-22 DIAGNOSIS — I509 Heart failure, unspecified: Secondary | ICD-10-CM | POA: Insufficient documentation

## 2024-03-22 DIAGNOSIS — E1143 Type 2 diabetes mellitus with diabetic autonomic (poly)neuropathy: Secondary | ICD-10-CM | POA: Insufficient documentation

## 2024-03-22 DIAGNOSIS — R112 Nausea with vomiting, unspecified: Secondary | ICD-10-CM | POA: Diagnosis present

## 2024-03-22 DIAGNOSIS — Z7901 Long term (current) use of anticoagulants: Secondary | ICD-10-CM | POA: Diagnosis not present

## 2024-03-22 LAB — COMPREHENSIVE METABOLIC PANEL WITH GFR
ALT: 10 U/L (ref 0–44)
AST: 16 U/L (ref 15–41)
Albumin: 3.3 g/dL — ABNORMAL LOW (ref 3.5–5.0)
Alkaline Phosphatase: 74 U/L (ref 38–126)
Anion gap: 13 (ref 5–15)
BUN: 12 mg/dL (ref 6–20)
CO2: 22 mmol/L (ref 22–32)
Calcium: 9 mg/dL (ref 8.9–10.3)
Chloride: 104 mmol/L (ref 98–111)
Creatinine, Ser: 1.08 mg/dL — ABNORMAL HIGH (ref 0.44–1.00)
GFR, Estimated: >60 mL/min (ref >60)
Glucose, Bld: 167 mg/dL — ABNORMAL HIGH (ref 70–99)
Potassium: 3 mmol/L — ABNORMAL LOW (ref 3.5–5.1)
Sodium: 139 mmol/L (ref 135–145)
Total Bilirubin: 1.2 mg/dL (ref 0.0–1.2)
Total Protein: 7.2 g/dL (ref 6.5–8.1)

## 2024-03-22 LAB — TROPONIN I (HIGH SENSITIVITY): Troponin I (High Sensitivity): 6 ng/L (ref <18)

## 2024-03-22 LAB — LIPASE, BLOOD: Lipase: 31 U/L (ref 11–51)

## 2024-03-22 LAB — CBC WITH DIFFERENTIAL/PLATELET
Abs Immature Granulocytes: 0.02 K/uL (ref 0.00–0.07)
Basophils Absolute: 0.1 K/uL (ref 0.0–0.1)
Basophils Relative: 1 %
Eosinophils Absolute: 0.4 K/uL (ref 0.0–0.5)
Eosinophils Relative: 4 %
HCT: 33.7 % — ABNORMAL LOW (ref 36.0–46.0)
Hemoglobin: 10.3 g/dL — ABNORMAL LOW (ref 12.0–15.0)
Immature Granulocytes: 0 %
Lymphocytes Relative: 18 %
Lymphs Abs: 1.7 K/uL (ref 0.7–4.0)
MCH: 27.8 pg (ref 26.0–34.0)
MCHC: 30.6 g/dL (ref 30.0–36.0)
MCV: 91.1 fL (ref 80.0–100.0)
Monocytes Absolute: 0.8 K/uL (ref 0.1–1.0)
Monocytes Relative: 8 %
Neutro Abs: 6.5 K/uL (ref 1.7–7.7)
Neutrophils Relative %: 69 %
Platelets: 241 K/uL (ref 150–400)
RBC: 3.7 MIL/uL — ABNORMAL LOW (ref 3.87–5.11)
RDW: 14.5 % (ref 11.5–15.5)
WBC: 9.4 K/uL (ref 4.0–10.5)
nRBC: 0 % (ref 0.0–0.2)

## 2024-03-22 LAB — MAGNESIUM: Magnesium: 2 mg/dL (ref 1.7–2.4)

## 2024-03-22 LAB — BRAIN NATRIURETIC PEPTIDE: B Natriuretic Peptide: 63.8 pg/mL (ref 0.0–100.0)

## 2024-03-22 MED ORDER — DROPERIDOL 2.5 MG/ML IJ SOLN
2.5000 mg | Freq: Once | INTRAMUSCULAR | Status: AC
  Administered 2024-03-22: 2.5 mg via INTRAVENOUS
  Filled 2024-03-22: qty 2

## 2024-03-22 MED ORDER — PROMETHAZINE HCL 25 MG RE SUPP: 25.0000 mg | Freq: Four times a day (QID) | RECTAL | 0 refills | Status: AC | PRN

## 2024-03-22 MED ORDER — ONDANSETRON 4 MG PO TBDP: 4.0000 mg | ORAL_TABLET | Freq: Three times a day (TID) | ORAL | 0 refills | Status: DC | PRN

## 2024-03-22 MED ORDER — ONDANSETRON HCL 4 MG/2ML IJ SOLN
4.0000 mg | Freq: Once | INTRAMUSCULAR | Status: AC
  Administered 2024-03-22: 4 mg via INTRAVENOUS
  Filled 2024-03-22: qty 2

## 2024-03-22 NOTE — ED Triage Notes (Signed)
 BIBA from home.  Vomiting since Monday, reports abdominal and lower back pain at 10/10.  D/C from hospital on Sunday for CHF flair up  140/92 HR 96 RR 20 95% RA CBG 154

## 2024-03-22 NOTE — ED Provider Notes (Signed)
 [place] EMERGENCY DEPARTMENT AT [facility] Provider Note   CSN: [id number] Arrival date & time: 03/22/24  9843     Patient presents with: Emesis   [name] is a 53 y.o. female.   The history is provided by the patient and medical records.  Emesis  53 y.o. female with history of morbid obesity, congestive heart failure with EF of 30 to 35%, diabetic gastroparesis, diabetes, Sjogren syndrome, presenting to the ED with generalized abdominal pain, nausea, and vomiting.  She was hospitalized over the weekend at [facility] for CHF exacerbation, was discharged Sunday evening.  States symptoms began on Monday.  She states not able to hold anything down.  Reports some chest pain but thinks it is from vomiting-- states it burns and feels like acid.  No fever/chills/sweats.  No changes in medications once discharged on Sunday.  Currently on eliquis  due to hx of PE's.  Prior to Admission medications   Medication Sig Start Date End Date Taking? Authorizing Provider  ADVAIR HFA 230-21 MCG/ACT inhaler Inhale 2 puffs into the lungs in the morning, at noon, and at bedtime. 12/20/20   [provider]  albuterol  (PROVENTIL ) (2.5 MG/3ML) 0.083% nebulizer solution Take 2.5 mg by nebulization as needed for wheezing.    [provider]  apixaban  (ELIQUIS ) 2.5 MG TABS tablet Take 1 tablet (2.5 mg total) by mouth 2 (two) times daily. 02/29/24   Drusilla Sabas RAMAN, MD  Azathioprine  75 MG TABS Take 1 tablet by mouth 2 (two) times daily. 01/19/23   [provider]  B-D UF III MINI PEN NEEDLES 31G X 5 MM MISC USE AS DIRECTED TWICE A DAY 10/31/18   Kassie Mallick, MD  BENLYSTA 200 MG/ML SOAJ INJECT THE CONTENTS OF 1 PEN INTO THE SKIN EVERY 7 DAYS. 01/31/24 01/30/25  [provider]  carvedilol  (COREG ) 25 MG tablet Take 1 tablet (25 mg total) by mouth 2 (two) times daily. 03/15/24 06/14/24  Ladona Heinz, MD  collagenase  (SANTYL ) 250 UNIT/GM ointment Apply topically daily. 03/01/24    Drusilla Sabas RAMAN, MD  cycloSPORINE  (RESTASIS ) 0.05 % ophthalmic emulsion Place 1 drop into both eyes in the morning, at noon, and at bedtime.     [provider]  diclofenac  Sodium (VOLTAREN ) 1 % GEL APPLY 2 GRAMS TO AFFECTED AREA 4 TIMES A DAY Patient taking differently: Apply 1 Application topically daily as needed (pain). 01/11/22   Cantwell, Celeste C, PA-C  EASY TOUCH PEN NEEDLES 32G X 4 MM MISC USE TO INJECT INSULIN  TWICE DAILY 08/07/19   Kassie Mallick, MD  empagliflozin  (JARDIANCE ) 10 MG TABS tablet Take 1 tablet (10 mg total) by mouth daily. 03/01/24   Drusilla Sabas RAMAN, MD  famotidine  (PEPCID ) 20 MG tablet Take 20 mg by mouth 2 (two) times daily.    [provider]  fluticasone  (FLONASE ) 50 MCG/ACT nasal spray Place 2 sprays into both nostrils daily as needed for allergies or rhinitis.    [provider]  glucose blood (ONETOUCH ULTRA) test strip Test blood sugar 4 times a day 03/30/22   Von Pacific, MD  hydroxychloroquine  (PLAQUENIL ) 200 MG tablet Take 200 mg by mouth 2 (two) times daily.    [provider]  insulin  regular human CONCENTRATED (HUMULIN  R U-500 KWIKPEN) 500 UNIT/ML KwikPen 100 UNITS, 30 MINUTES BEFORE EACH MEAL Patient taking differently: Inject 30 Units into the skin 3 (three) times daily with meals. 11/17/22   Von Pacific, MD  Insulin  Syringe-Needle U-100 (EASY TOUCH INSULIN  SYRINGE)  31G X 5/16 1 ML MISC USE AS DIRECTED THREE TIMES A DAY 10/12/18   Kassie Mallick, MD  isosorbide -hydrALAZINE  (BIDIL ) 20-37.5 MG tablet Take 1 tablet by mouth 3 (three) times daily. 03/18/24   Ladona Heinz, MD  levothyroxine  (SYNTHROID ) 50 MCG tablet Take 1 tablet (50 mcg total) by mouth daily at 6 (six) AM. 08/30/19   Sherrill Cable Latif, DO  meclizine  (ANTIVERT ) 12.5 MG tablet Take 1 tablet (12.5 mg total) by mouth 3 (three) times daily as needed for dizziness. 05/03/23   Regalado, Belkys A, MD  metFORMIN  (GLUCOPHAGE -XR) 500 MG 24 hr tablet Take 2 tablets (1,000 mg total) by  mouth 2 (two) times daily. 02/02/21   Kassie Mallick, MD  metoCLOPramide  (REGLAN ) 10 MG tablet Take 1 tablet (10 mg total) by mouth 3 (three) times daily before meals. Patient taking differently: Take 20 mg by mouth daily as needed for vomiting or nausea. 05/01/23   Verdene Purchase, MD  oxybutynin  (DITROPAN -XL) 5 MG 24 hr tablet Take 5 mg by mouth daily.    [provider]  pantoprazole  (PROTONIX ) 40 MG tablet Take 1 tablet (40 mg total) by mouth daily at 12 noon. Patient taking differently: Take 40 mg by mouth daily. 05/04/23   Regalado, Belkys A, MD  rosuvastatin  (CRESTOR ) 20 MG tablet Take 1 tablet (20 mg total) by mouth daily. 02/29/24 05/29/24  Drusilla Sabas RAMAN, MD  sacubitril -valsartan  (ENTRESTO ) 97-103 MG Take 1 tablet by mouth 2 (two) times daily. 03/15/24   Ladona Heinz, MD  spironolactone  (ALDACTONE ) 25 MG tablet Take 0.5 tablets (12.5 mg total) by mouth daily. 03/01/24   Drusilla Sabas RAMAN, MD  topiramate  (TOPAMAX ) 25 MG tablet Take 75 mg by mouth at bedtime. 03/17/16   [provider]  torsemide  (DEMADEX ) 20 MG tablet Take 1 tablet (20 mg total) by mouth daily as needed (as needed for weight gain of 5-10 lbs, worsening shortness of breath, or worsening lower extremity edema.). 02/29/24   Drusilla Sabas RAMAN, MD  Vitamin D, Ergocalciferol, (DRISDOL) 1.25 MG (50000 UNIT) CAPS capsule Take 50,000 Units by mouth once a week. 09/20/22   [provider]    Allergies: Metoclopramide , Codeine, Hydrocodone , and Percocet [oxycodone -acetaminophen ]    Review of Systems  Gastrointestinal:  Positive for nausea and vomiting.  All other systems reviewed and are negative.   Updated Vital Signs BP (!) 157/81   Pulse 97   Temp 99.1 F (37.3 C) (Oral)   Resp 17   Ht 5' 8 (1.727 m)   Wt (!) 149.7 kg   LMP 03/24/2017   SpO2 98%   BMI 50.18 kg/m   Physical Exam Vitals and nursing note reviewed.  Constitutional:      Appearance: She is well-developed. She is obese.     Comments: Holding  emesis bag which is empty, no dry heaving or active emesis  HENT:     Head: Normocephalic and atraumatic.  Eyes:     Conjunctiva/sclera: Conjunctivae normal.     Pupils: Pupils are equal, round, and reactive to light.  Cardiovascular:     Rate and Rhythm: Normal rate and regular rhythm.     Heart sounds: Normal heart sounds.  Pulmonary:     Effort: Pulmonary effort is normal.     Breath sounds: Normal breath sounds.  Abdominal:     General: Bowel sounds are normal.     Palpations: Abdomen is soft.  Musculoskeletal:        General: Normal range of motion.  Cervical back: Normal range of motion.  Skin:    General: Skin is warm and dry.  Neurological:     Mental Status: She is alert and oriented to person, place, and time.     (all labs ordered are listed, but only abnormal results are displayed) Labs Reviewed  CBC WITH DIFFERENTIAL/PLATELET - Abnormal; Notable for the following components:      Result Value   RBC 3.70 (*)    Hemoglobin 10.3 (*)    HCT 33.7 (*)    All other components within normal limits  COMPREHENSIVE METABOLIC PANEL WITH GFR - Abnormal; Notable for the following components:   Potassium 3.0 (*)    Glucose, Bld 167 (*)    Creatinine, Ser 1.08 (*)    Albumin  3.3 (*)    All other components within normal limits  LIPASE, BLOOD  BRAIN NATRIURETIC PEPTIDE  MAGNESIUM   TROPONIN I (HIGH SENSITIVITY)    EKG: None  Radiology: DG ABD ACUTE 2+V W 1V CHEST Result Date: 03/22/2024 CLINICAL DATA:  Nausea, vomiting, chest pain and mid abdominal pain EXAM: DG ABDOMEN ACUTE WITH 1 VIEW CHEST COMPARISON:  CT abdomen pelvis 02/23/2024; chest radiograph 02/14/2024 FINDINGS: Low lung volumes and underpenetration limits evaluation. Stable probable cardiomegaly and pulmonary vascular congestion. Question bilateral perihilar infiltrates. No definite pleural effusion or pneumothorax. No evidence of bowel obstruction. IMPRESSION: 1. Low lung volumes. Question bilateral  perihilar infiltrates or edema. 2. No evidence of bowel obstruction. Electronically Signed   By: Norman Gatlin M.D.   On: 03/22/2024 03:52     Procedures   Medications Ordered in the ED  ondansetron  (ZOFRAN ) injection 4 mg (4 mg Intravenous Given 03/22/24 0248)  droperidol  (INAPSINE ) 2.5 MG/ML injection 2.5 mg (2.5 mg Intravenous Given 03/22/24 0309)                                    Medical Decision Making Amount and/or Complexity of Data Reviewed Labs: ordered. Radiology: ordered and independent interpretation performed. ECG/medicine tests: ordered and independent interpretation performed.  Risk Prescription drug management.   53 year old female presenting to the ED with abdominal discomfort, nausea, and vomiting.  She does have history of gastroparesis, multiple prior visits for same.  She denies any diarrhea, fever, or chills.  She is afebrile and nontoxic in appearance here.  Has emesis bag with her but this is empty.  She is not actively vomiting.  She does not have any peritoneal signs on exam.  Chart reviewed--has had multiple prior imaging studies including recent CT which did not show any acute findings.  Will obtain labs, acute abdominal series, EKG.  She is given Zofran .  Requesting pain medication shortly after zofran .  QT is appropriate on EKG, given dose of droperidol  which appears to have worked well for her in the past.    Labs as above--no leukocytosis or electrolyte derangement.  Normal renal function.  Troponin and BNP are within normal limits.  Acute abdominal series without obstructive findings.  Questionably infiltrates but she is not having any cough, fever, or other infectious symptoms.  She does not appear clinically fluid overloaded at this time and I have not given her any IV fluids today due to her CHF.    Patient sleeping on repeat assessment.  Have awoken her to discussed that all testing is reassuring.  Given water  for PO challenge.  4:39 AM Has  tolerated water  here without issue.  Has  not actually had any active emesis since arrival in the ED 3 hours ago.  Remains HD stable.  I do not see any indication for hospital admission at this time.  Appropriate for discharge.  Nausea medications sent to pharmacy, encouraged good oral hydration, bland diet for now.  Encouraged to follow-up with PCP.  Return here for new concerns.  Final diagnoses:  Nausea and vomiting, unspecified vomiting type    ED Discharge Orders          Ordered    promethazine  (PHENERGAN ) 25 MG suppository  Every 6 hours PRN        03/22/24 0442    ondansetron  (ZOFRAN -ODT) 4 MG disintegrating tablet  Every 8 hours PRN        03/22/24 0442               Jarold Olam HERO, PA-C 03/22/24 0556    Griselda Norris, MD 03/23/24 [phone]

## 2024-03-22 NOTE — Discharge Instructions (Signed)
 Take the prescribed medication as directed-- I would use zofran  first line, suppository for back up. Try to stay hydrated.  Bland diet for now and advance as tolerated. Follow-up with your primary care doctor. Return to the ED for new or worsening symptoms.

## 2024-03-24 ENCOUNTER — Emergency Department (HOSPITAL_COMMUNITY)
Admission: EM | Admit: 2024-03-24 | Discharge: 2024-03-24 | Disposition: A | Discharge status: Home / Self Care | Attending: Emergency Medicine | Admitting: Emergency Medicine

## 2024-03-24 ENCOUNTER — Emergency Department (HOSPITAL_COMMUNITY)

## 2024-03-24 ENCOUNTER — Other Ambulatory Visit: Payer: Self-pay

## 2024-03-24 DIAGNOSIS — Z7901 Long term (current) use of anticoagulants: Secondary | ICD-10-CM | POA: Diagnosis not present

## 2024-03-24 DIAGNOSIS — I509 Heart failure, unspecified: Secondary | ICD-10-CM | POA: Insufficient documentation

## 2024-03-24 DIAGNOSIS — Z794 Long term (current) use of insulin: Secondary | ICD-10-CM | POA: Insufficient documentation

## 2024-03-24 DIAGNOSIS — R1084 Generalized abdominal pain: Secondary | ICD-10-CM | POA: Diagnosis present

## 2024-03-24 LAB — CBC
HCT: 36.4 % (ref 36.0–46.0)
Hemoglobin: 11.1 g/dL — ABNORMAL LOW (ref 12.0–15.0)
MCH: 27.7 pg (ref 26.0–34.0)
MCHC: 30.5 g/dL (ref 30.0–36.0)
MCV: 90.8 fL (ref 80.0–100.0)
Platelets: 248 K/uL (ref 150–400)
RBC: 4.01 MIL/uL (ref 3.87–5.11)
RDW: 14.6 % (ref 11.5–15.5)
WBC: 10.3 K/uL (ref 4.0–10.5)
nRBC: 0 % (ref 0.0–0.2)

## 2024-03-24 LAB — COMPREHENSIVE METABOLIC PANEL WITH GFR
ALT: 10 U/L (ref 0–44)
AST: 18 U/L (ref 15–41)
Albumin: 3.3 g/dL — ABNORMAL LOW (ref 3.5–5.0)
Alkaline Phosphatase: 75 U/L (ref 38–126)
Anion gap: 13 (ref 5–15)
BUN: 11 mg/dL (ref 6–20)
CO2: 21 mmol/L — ABNORMAL LOW (ref 22–32)
Calcium: 8.9 mg/dL (ref 8.9–10.3)
Chloride: 109 mmol/L (ref 98–111)
Creatinine, Ser: 1.06 mg/dL — ABNORMAL HIGH (ref 0.44–1.00)
GFR, Estimated: >60 mL/min (ref >60)
Glucose, Bld: 125 mg/dL — ABNORMAL HIGH (ref 70–99)
Potassium: 3.5 mmol/L (ref 3.5–5.1)
Sodium: 143 mmol/L (ref 135–145)
Total Bilirubin: 1.3 mg/dL — ABNORMAL HIGH (ref 0.0–1.2)
Total Protein: 6.9 g/dL (ref 6.5–8.1)

## 2024-03-24 LAB — LIPASE, BLOOD: Lipase: 24 U/L (ref 11–51)

## 2024-03-24 MED ORDER — DROPERIDOL 2.5 MG/ML IJ SOLN
0.6250 mg | Freq: Once | INTRAMUSCULAR | Status: AC
  Administered 2024-03-24: 0.625 mg via INTRAVENOUS
  Filled 2024-03-24: qty 2

## 2024-03-24 MED ORDER — MORPHINE SULFATE (PF) 4 MG/ML IV SOLN
4.0000 mg | Freq: Once | INTRAVENOUS | Status: AC
  Administered 2024-03-24: 4 mg via INTRAVENOUS
  Filled 2024-03-24: qty 1

## 2024-03-24 MED ORDER — PROMETHAZINE HCL 25 MG PO TABS: 25.0000 mg | ORAL_TABLET | Freq: Four times a day (QID) | ORAL | 0 refills | Status: DC | PRN

## 2024-03-24 MED ORDER — ONDANSETRON HCL 4 MG/2ML IJ SOLN
4.0000 mg | Freq: Once | INTRAMUSCULAR | Status: AC
  Administered 2024-03-24: 4 mg via INTRAVENOUS
  Filled 2024-03-24: qty 2

## 2024-03-24 NOTE — ED Provider Notes (Signed)
 [facility] AT [facility] Provider Note   CSN: [id number] Arrival date & time: 03/24/24  1228     Patient presents with: Abdominal Pain and Emesis   [name] is a 53 y.o. female with primary complaint of of continued nausea, vomiting, and generalized abdominal pain.  Seen 2 days prior here in the ED and discharged with ondansetron  and Compazine , without any relief in her symptoms continues to be able to tolerate p.o. intake.  She has a extensive medical history of morbid obesity, CHF with reduced EF of 30 to 35%, diabetic gastroparesis, Sjogren syndrome.  Review of her medical records does show previous recent admission for CHF exacerbation with symptoms beginning the day after her discharge from that admission.  Reports having formed bowel movement yesterday afternoon and has denied any dysuria with normal urination.  Reports having vomited more than 5 times today.  Review of labs from previous ED admission shows no remarkable lab abnormalities except for hypokalemia 3.0.  Otherwise liver enzymes and lipase are within normal limits, and there are no other concerning findings on this evaluation.  Patient had received a KUB imaging and showed that there was no evidence of bowel obstruction at that time.  Managed previously with droperidol  and ondansetron  in the ED which managed her symptoms at that time however she continues to have previously mentioned symptoms.    Abdominal Pain Associated symptoms: nausea and vomiting   Emesis Associated symptoms: abdominal pain        Prior to Admission medications   Medication Sig Start Date End Date Taking? Authorizing Provider  ADVAIR HFA 230-21 MCG/ACT inhaler Inhale 2 puffs into the lungs in the morning, at noon, and at bedtime. 12/20/20   [provider]  albuterol  (PROVENTIL ) (2.5 MG/3ML) 0.083% nebulizer solution Take 2.5 mg by nebulization as needed for wheezing.    [provider]   apixaban  (ELIQUIS ) 2.5 MG TABS tablet Take 1 tablet (2.5 mg total) by mouth 2 (two) times daily. 02/29/24   Drusilla Sabas RAMAN, MD  Azathioprine  75 MG TABS Take 1 tablet by mouth 2 (two) times daily. 01/19/23   [provider]  B-D UF III MINI PEN NEEDLES 31G X 5 MM MISC USE AS DIRECTED TWICE A DAY 10/31/18   Kassie Mallick, MD  BENLYSTA 200 MG/ML SOAJ INJECT THE CONTENTS OF 1 PEN INTO THE SKIN EVERY 7 DAYS. 01/31/24 01/30/25  [provider]  carvedilol  (COREG ) 25 MG tablet Take 1 tablet (25 mg total) by mouth 2 (two) times daily. 03/15/24 06/14/24  Ladona Heinz, MD  collagenase  (SANTYL ) 250 UNIT/GM ointment Apply topically daily. 03/01/24   Drusilla Sabas RAMAN, MD  cycloSPORINE  (RESTASIS ) 0.05 % ophthalmic emulsion Place 1 drop into both eyes in the morning, at noon, and at bedtime.     [provider]  diclofenac  Sodium (VOLTAREN ) 1 % GEL APPLY 2 GRAMS TO AFFECTED AREA 4 TIMES A DAY Patient taking differently: Apply 1 Application topically daily as needed (pain). 01/11/22   Cantwell, Celeste C, PA-C  EASY TOUCH PEN NEEDLES 32G X 4 MM MISC USE TO INJECT INSULIN  TWICE DAILY 08/07/19   Kassie Mallick, MD  empagliflozin  (JARDIANCE ) 10 MG TABS tablet Take 1 tablet (10 mg total) by mouth daily. 03/01/24   Drusilla Sabas RAMAN, MD  famotidine  (PEPCID ) 20 MG tablet Take 20 mg by mouth 2 (two) times daily.    [provider]  fluticasone  (FLONASE ) 50 MCG/ACT nasal spray Place 2 sprays into both  nostrils daily as needed for allergies or rhinitis.    [provider]  glucose blood (ONETOUCH ULTRA) test strip Test blood sugar 4 times a day 03/30/22   Von Pacific, MD  hydroxychloroquine  (PLAQUENIL ) 200 MG tablet Take 200 mg by mouth 2 (two) times daily.    [provider]  insulin  regular human CONCENTRATED (HUMULIN  R U-500 KWIKPEN) 500 UNIT/ML KwikPen 100 UNITS, 30 MINUTES BEFORE EACH MEAL Patient taking differently: Inject 30 Units into the skin 3 (three) times daily with meals. 11/17/22    Von Pacific, MD  Insulin  Syringe-Needle U-100 (EASY TOUCH INSULIN  SYRINGE) 31G X 5/16 1 ML MISC USE AS DIRECTED THREE TIMES A DAY 10/12/18   Kassie Mallick, MD  isosorbide -hydrALAZINE  (BIDIL ) 20-37.5 MG tablet Take 1 tablet by mouth 3 (three) times daily. 03/18/24   Ladona Heinz, MD  levothyroxine  (SYNTHROID ) 50 MCG tablet Take 1 tablet (50 mcg total) by mouth daily at 6 (six) AM. 08/30/19   Sherrill Cable Latif, DO  meclizine  (ANTIVERT ) 12.5 MG tablet Take 1 tablet (12.5 mg total) by mouth 3 (three) times daily as needed for dizziness. 05/03/23   Regalado, Belkys A, MD  metFORMIN  (GLUCOPHAGE -XR) 500 MG 24 hr tablet Take 2 tablets (1,000 mg total) by mouth 2 (two) times daily. 02/02/21   Kassie Mallick, MD  metoCLOPramide  (REGLAN ) 10 MG tablet Take 1 tablet (10 mg total) by mouth 3 (three) times daily before meals. Patient taking differently: Take 20 mg by mouth daily as needed for vomiting or nausea. 05/01/23   Krishnan, Gokul, MD  ondansetron  (ZOFRAN -ODT) 4 MG disintegrating tablet Take 1 tablet (4 mg total) by mouth every 8 (eight) hours as needed. 03/22/24   Jarold Olam HERO, PA-C  oxybutynin  (DITROPAN -XL) 5 MG 24 hr tablet Take 5 mg by mouth daily.    [provider]  pantoprazole  (PROTONIX ) 40 MG tablet Take 1 tablet (40 mg total) by mouth daily at 12 noon. Patient taking differently: Take 40 mg by mouth daily. 05/04/23   Regalado, Owen A, MD  promethazine  (PHENERGAN ) 25 MG suppository Place 1 suppository (25 mg total) rectally every 6 (six) hours as needed for nausea or vomiting. 03/22/24   Jarold Olam HERO, PA-C  rosuvastatin  (CRESTOR ) 20 MG tablet Take 1 tablet (20 mg total) by mouth daily. 02/29/24 05/29/24  Drusilla Sabas RAMAN, MD  sacubitril -valsartan  (ENTRESTO ) 97-103 MG Take 1 tablet by mouth 2 (two) times daily. 03/15/24   Ladona Heinz, MD  spironolactone  (ALDACTONE ) 25 MG tablet Take 0.5 tablets (12.5 mg total) by mouth daily. 03/01/24   Drusilla Sabas RAMAN, MD  topiramate  (TOPAMAX ) 25 MG tablet Take 75 mg  by mouth at bedtime. 03/17/16   [provider]  torsemide  (DEMADEX ) 20 MG tablet Take 1 tablet (20 mg total) by mouth daily as needed (as needed for weight gain of 5-10 lbs, worsening shortness of breath, or worsening lower extremity edema.). 02/29/24   Drusilla Sabas RAMAN, MD  Vitamin D, Ergocalciferol, (DRISDOL) 1.25 MG (50000 UNIT) CAPS capsule Take 50,000 Units by mouth once a week. 09/20/22   [provider]    Allergies: Metoclopramide , Codeine, Hydrocodone , and Percocet [oxycodone -acetaminophen ]    Review of Systems  Gastrointestinal:  Positive for abdominal pain, nausea and vomiting.  All other systems reviewed and are negative.   Updated Vital Signs BP (!) 154/76 (BP Location: Right Arm)   Pulse 88   Temp 98.8 F (37.1 C) (Oral)   Resp 20   LMP 03/24/2017   SpO2 100%  Physical Exam Vitals and nursing note reviewed.  Constitutional:      General: She is not in acute distress.    Appearance: Normal appearance.  HENT:     Head: Normocephalic and atraumatic.     Mouth/Throat:     Mouth: Mucous membranes are moist.     Pharynx: Oropharynx is clear.  Eyes:     Extraocular Movements: Extraocular movements intact.     Conjunctiva/sclera: Conjunctivae normal.     Pupils: Pupils are equal, round, and reactive to light.  Cardiovascular:     Rate and Rhythm: Normal rate and regular rhythm.     Pulses: Normal pulses.     Heart sounds: Normal heart sounds. No murmur heard.    No friction rub. No gallop.  Pulmonary:     Effort: Pulmonary effort is normal.     Breath sounds: Normal breath sounds.  Abdominal:     General: Abdomen is flat. Bowel sounds are normal.     Palpations: Abdomen is soft.     Tenderness: There is generalized abdominal tenderness.  Musculoskeletal:        General: Normal range of motion.     Cervical back: Normal range of motion and neck supple.     Right lower leg: No edema.     Left lower leg: No edema.  Skin:    General: Skin is warm  and dry.     Capillary Refill: Capillary refill takes less than 2 seconds.  Neurological:     General: No focal deficit present.     Mental Status: She is alert. Mental status is at baseline.  Psychiatric:        Mood and Affect: Mood normal.     (all labs ordered are listed, but only abnormal results are displayed) Labs Reviewed  COMPREHENSIVE METABOLIC PANEL WITH GFR - Abnormal; Notable for the following components:      Result Value   CO2 21 (*)    Glucose, Bld 125 (*)    Creatinine, Ser 1.06 (*)    Albumin  3.3 (*)    Total Bilirubin 1.3 (*)    All other components within normal limits  CBC - Abnormal; Notable for the following components:   Hemoglobin 11.1 (*)    All other components within normal limits  LIPASE, BLOOD  URINALYSIS, ROUTINE W REFLEX MICROSCOPIC    EKG: None  Radiology: No results found.   Procedures   Medications Ordered in the ED  ondansetron  (ZOFRAN ) injection 4 mg (4 mg Intravenous Given 03/24/24 1416)  droperidol  (INAPSINE ) 2.5 MG/ML injection 0.625 mg (0.625 mg Intravenous Given 03/24/24 1516)  morphine  (PF) 4 MG/ML injection 4 mg (4 mg Intravenous Given 03/24/24 1522)                                    Medical Decision Making Amount and/or Complexity of Data Reviewed Labs: ordered.  Risk Prescription drug management.   Medical Decision Making:   BRYELLA DIVINEY is a 53 y.o. female who presented to the ED today with abdominal pain with nausea and vomiting detailed above.    External chart has been reviewed including previous labs, imaging, admission records. Patient's presentation is complicated by their history of type 2 diabetes with gastroparesis.  Patient placed on continuous vitals and telemetry monitoring while in ED which was reviewed periodically.  Complete initial physical exam performed, notably the patient  was alert and oriented  in no distress but visibly uncomfortable..    Reviewed and confirmed nursing documentation for past  medical history, family history, social history.    Initial Assessment:   With the patient's presentation of upper abdominal pain, most likely diagnosis is continued sequelae of diabetic gastroparesis.  Further consider acute pancreatitis, hepatobiliary disease, bowel obstruction though this is less likely as she has had formed bowel movements within the last 24 hours.  Initial Plan:  Assess serum lipase to address pancreatic pathology Screening labs including CBC and Metabolic panel to evaluate for infectious or metabolic etiology of disease.  Urinalysis with reflex culture ordered to evaluate for UTI or relevant urologic/nephrologic pathology.  Objective evaluation as below reviewed   Initial Study Results:   Laboratory  All laboratory results reviewed without evidence of clinically relevant pathology.   Exceptions include: Creatinine is up elevated to 1.06 which is baseline for this patient.  Hemoglobin noted to be 11.1 which is also stable for this patient.  Radiology:  All images reviewed independently. Agree with radiology report at this time.   DG ABD ACUTE 2+V W 1V CHEST Result Date: 03/22/2024 CLINICAL DATA:  Nausea, vomiting, chest pain and mid abdominal pain EXAM: DG ABDOMEN ACUTE WITH 1 VIEW CHEST COMPARISON:  CT abdomen pelvis 02/23/2024; chest radiograph 02/14/2024 FINDINGS: Low lung volumes and underpenetration limits evaluation. Stable probable cardiomegaly and pulmonary vascular congestion. Question bilateral perihilar infiltrates. No definite pleural effusion or pneumothorax. No evidence of bowel obstruction. IMPRESSION: 1. Low lung volumes. Question bilateral perihilar infiltrates or edema. 2. No evidence of bowel obstruction. Electronically Signed   By: Norman Gatlin M.D.   On: 03/22/2024 03:52   ECHOCARDIOGRAM LIMITED Result Date: 02/24/2024    ECHOCARDIOGRAM LIMITED REPORT   Patient Name:   [name] Date of Exam: 02/24/2024 Medical Rec #:  [id number]     Height:        69.0 in Accession #:    [id number]    Weight:       337.3 lb Date of Birth:  [birth year]     BSA:          2.580 m Patient Age:    53 years      BP:           101/54 mmHg Patient Gender: F             HR:           90 bpm. Exam Location:  Inpatient Procedure: Limited Echo, Cardiac Doppler, Color Doppler and Intracardiac            Opacification Agent (Both Spectral and Color Flow Doppler were            utilized during procedure). Indications:    I50.40* Unspecified combined systolic (congestive) and diastolic                 (congestive) heart failure  History:        Patient has prior history of Echocardiogram examinations, most                 recent 02/15/2024. CHF, Previous Myocardial Infarction, Stroke,                 Signs/Symptoms:Altered Mental Status and Bacteremia; Risk                 Factors:Hypertension.  Sonographer:    Ellouise Mose RDCS Referring Phys: [id number] SOYLA DELENA MERCK  Sonographer Comments: Technically difficult study due to poor echo  windows and patient is obese. Image acquisition challenging due to patient body habitus. Limited study for LV function and wall motion. IMPRESSIONS  1. Left ventricular ejection fraction, by estimation, is 65 to 70%. The left ventricle has normal function. The left ventricle has no regional wall motion abnormalities. There is mild asymmetric left ventricular hypertrophy of the infero-lateral segment.  2. Right ventricular systolic function is normal. The right ventricular size is normal. There is normal pulmonary artery systolic pressure.  3. A small pericardial effusion is present. There is no evidence of cardiac tamponade.  4. The mitral valve is grossly normal. Trivial mitral valve regurgitation.  5. The aortic valve is tricuspid. Aortic valve regurgitation is not visualized.  6. The inferior vena cava is normal in size with greater than 50% respiratory variability, suggesting right atrial pressure of 3 mmHg. Comparison(s): The left ventricular function has  improved. The left ventricular wall motion abnormalities are improved. FINDINGS  Left Ventricle: Left ventricular ejection fraction, by estimation, is 65 to 70%. The left ventricle has normal function. The left ventricle has no regional wall motion abnormalities. Definity  contrast agent was given IV to delineate the left ventricular  endocardial borders. There is mild asymmetric left ventricular hypertrophy of the infero-lateral segment. Right Ventricle: The right ventricular size is normal. Right vetricular wall thickness was not well visualized. Right ventricular systolic function is normal. There is normal pulmonary artery systolic pressure. The tricuspid regurgitant velocity is 2.32 m/s, and with an assumed right atrial pressure of 3 mmHg, the estimated right ventricular systolic pressure is 24.5 mmHg. Pericardium: A small pericardial effusion is present. There is no evidence of cardiac tamponade. Mitral Valve: The mitral valve is grossly normal. Trivial mitral valve regurgitation. Tricuspid Valve: The tricuspid valve is grossly normal. Tricuspid valve regurgitation is mild . No evidence of tricuspid stenosis. Aortic Valve: The aortic valve is tricuspid. Aortic valve regurgitation is not visualized. Aorta: The aortic root and ascending aorta are structurally normal, with no evidence of dilitation. Venous: The inferior vena cava is normal in size with greater than 50% respiratory variability, suggesting right atrial pressure of 3 mmHg. Additional Comments: Spectral Doppler performed. Color Doppler performed.  LEFT VENTRICLE PLAX 2D LVIDd:         5.60 cm LVIDs:         3.35 cm LV PW:         1.25 cm LV IVS:        0.95 cm  LV Volumes (MOD) LV vol d, MOD A2C: 127.0 ml LV vol d, MOD A4C: 103.0 ml LV vol s, MOD A2C: 40.6 ml LV vol s, MOD A4C: 29.0 ml LV SV MOD A2C:     86.4 ml LV SV MOD A4C:     103.0 ml LV SV MOD BP:      80.0 ml IVC IVC diam: 1.60 cm  AORTA Ao Asc diam: 3.20 cm TRICUSPID VALVE TR Peak grad:   21.5  mmHg TR Vmax:        232.00 cm/s Soyla Merck MD Electronically signed by Soyla Merck MD Signature Date/Time: 02/24/2024/10:07:51 AM    Final     Reassessment and Plan:   Initially attempted nausea control with ondansetron  which did not control this patient's nausea.  As such, patient given droperidol  for nausea management along with morphine  for pain control.  Plan at this time is to reassess with likely discharge if she can tolerate oral intake.  Otherwise may have to consider admission secondary to symptom control,  pain control.  Care passed to A, Advance, PA-C.       Final diagnoses:  None    ED Discharge Orders     None          Myriam Dorn BROCKS, [place] 03/24/24 1547    Charlyn Sora, MD 03/26/24 1539

## 2024-03-24 NOTE — ED Provider Notes (Signed)
 Pending p.o. trial at the end of my shift change.  See previous note for full details. Physical Exam  BP (!) 153/79   Pulse 70   Temp 98.5 F (36.9 C) (Oral)   Resp 16   LMP 03/24/2017   SpO2 95%     Procedures  Procedures  ED Course / MDM    Medical Decision Making Amount and/or Complexity of Data Reviewed Labs: ordered.  Risk Prescription drug management.   I evaluated patient.  She was feeling improved after droperidol .  She has an empty emesis bag in her lap.  Will p.o. trial.  1915: Patient successfully passed p.o. trial.  Requesting more water .  Emesis bag still empty.  She is agreeable with discharge.  She is requesting p.o. Phenergan .  Will provide this.  She will follow-up with GI.  Return precautions discussed.  Patient voices understanding and is in agreement with plan.       Hildegard Loge, PA-C 03/24/24 1921    Gennaro Duwaine CROME, DO 03/24/24 [phone]

## 2024-03-24 NOTE — Discharge Instructions (Addendum)
 Establish a gastroenterologist.  Referral listed above.  Continue to take Zofran  and for any severe breakthrough nausea take the Phenergan .  Return for any emergent symptoms.  Your blood work was reassuring.

## 2024-03-24 NOTE — ED Triage Notes (Signed)
 Pt BIBA from home for continued nausea, vomiting, and generalized abd pain. Seen 2 days ago, dc with zofran  and compazine , taking with no relief. States cannot keep anything down. Denies urinary sx.  Cbg 134 130 palp Hr 88 98% ra

## 2024-03-25 ENCOUNTER — Emergency Department (HOSPITAL_COMMUNITY)

## 2024-03-25 ENCOUNTER — Other Ambulatory Visit: Payer: Self-pay

## 2024-03-25 ENCOUNTER — Encounter (HOSPITAL_BASED_OUTPATIENT_CLINIC_OR_DEPARTMENT_OTHER): Admitting: General Surgery

## 2024-03-25 ENCOUNTER — Encounter (HOSPITAL_COMMUNITY): Payer: Self-pay

## 2024-03-25 ENCOUNTER — Inpatient Hospital Stay (HOSPITAL_COMMUNITY)
Admission: EM | Admit: 2024-03-25 | Discharge: 2024-04-04 | DRG: 074 | Disposition: A | Discharge status: Home / Self Care | Attending: Family Medicine | Admitting: Family Medicine

## 2024-03-25 DIAGNOSIS — E1143 Type 2 diabetes mellitus with diabetic autonomic (poly)neuropathy: Principal | ICD-10-CM | POA: Diagnosis present

## 2024-03-25 DIAGNOSIS — Z885 Allergy status to narcotic agent status: Secondary | ICD-10-CM

## 2024-03-25 DIAGNOSIS — I5042 Chronic combined systolic (congestive) and diastolic (congestive) heart failure: Secondary | ICD-10-CM | POA: Diagnosis present

## 2024-03-25 DIAGNOSIS — I10 Essential (primary) hypertension: Secondary | ICD-10-CM | POA: Diagnosis present

## 2024-03-25 DIAGNOSIS — Z803 Family history of malignant neoplasm of breast: Secondary | ICD-10-CM

## 2024-03-25 DIAGNOSIS — T45516A Underdosing of anticoagulants, initial encounter: Secondary | ICD-10-CM | POA: Diagnosis present

## 2024-03-25 DIAGNOSIS — Z79899 Other long term (current) drug therapy: Secondary | ICD-10-CM

## 2024-03-25 DIAGNOSIS — Z8042 Family history of malignant neoplasm of prostate: Secondary | ICD-10-CM

## 2024-03-25 DIAGNOSIS — Z888 Allergy status to other drugs, medicaments and biological substances status: Secondary | ICD-10-CM

## 2024-03-25 DIAGNOSIS — Z8673 Personal history of transient ischemic attack (TIA), and cerebral infarction without residual deficits: Secondary | ICD-10-CM

## 2024-03-25 DIAGNOSIS — Z794 Long term (current) use of insulin: Secondary | ICD-10-CM

## 2024-03-25 DIAGNOSIS — Z825 Family history of asthma and other chronic lower respiratory diseases: Secondary | ICD-10-CM

## 2024-03-25 DIAGNOSIS — E7800 Pure hypercholesterolemia, unspecified: Secondary | ICD-10-CM | POA: Diagnosis present

## 2024-03-25 DIAGNOSIS — Z833 Family history of diabetes mellitus: Secondary | ICD-10-CM

## 2024-03-25 DIAGNOSIS — T378X6A Underdosing of other specified systemic anti-infectives and antiparasitics, initial encounter: Secondary | ICD-10-CM | POA: Diagnosis present

## 2024-03-25 DIAGNOSIS — L89320 Pressure ulcer of left buttock, unstageable: Secondary | ICD-10-CM | POA: Diagnosis present

## 2024-03-25 DIAGNOSIS — R1084 Generalized abdominal pain: Secondary | ICD-10-CM | POA: Diagnosis present

## 2024-03-25 DIAGNOSIS — K219 Gastro-esophageal reflux disease without esophagitis: Secondary | ICD-10-CM | POA: Diagnosis present

## 2024-03-25 DIAGNOSIS — Z7901 Long term (current) use of anticoagulants: Secondary | ICD-10-CM

## 2024-03-25 DIAGNOSIS — I130 Hypertensive heart and chronic kidney disease with heart failure and stage 1 through stage 4 chronic kidney disease, or unspecified chronic kidney disease: Secondary | ICD-10-CM | POA: Diagnosis present

## 2024-03-25 DIAGNOSIS — R937 Abnormal findings on diagnostic imaging of other parts of musculoskeletal system: Secondary | ICD-10-CM | POA: Diagnosis present

## 2024-03-25 DIAGNOSIS — R1110 Vomiting, unspecified: Principal | ICD-10-CM

## 2024-03-25 DIAGNOSIS — M329 Systemic lupus erythematosus, unspecified: Secondary | ICD-10-CM | POA: Diagnosis present

## 2024-03-25 DIAGNOSIS — Z6841 Body Mass Index (BMI) 40.0 and over, adult: Secondary | ICD-10-CM

## 2024-03-25 DIAGNOSIS — K2100 Gastro-esophageal reflux disease with esophagitis, without bleeding: Secondary | ICD-10-CM | POA: Diagnosis present

## 2024-03-25 DIAGNOSIS — Z86711 Personal history of pulmonary embolism: Secondary | ICD-10-CM

## 2024-03-25 DIAGNOSIS — E876 Hypokalemia: Secondary | ICD-10-CM | POA: Diagnosis present

## 2024-03-25 DIAGNOSIS — R112 Nausea with vomiting, unspecified: Secondary | ICD-10-CM | POA: Diagnosis present

## 2024-03-25 DIAGNOSIS — D84821 Immunodeficiency due to drugs: Secondary | ICD-10-CM | POA: Diagnosis present

## 2024-03-25 DIAGNOSIS — G2581 Restless legs syndrome: Secondary | ICD-10-CM | POA: Diagnosis present

## 2024-03-25 DIAGNOSIS — Z8249 Family history of ischemic heart disease and other diseases of the circulatory system: Secondary | ICD-10-CM

## 2024-03-25 DIAGNOSIS — L89313 Pressure ulcer of right buttock, stage 3: Secondary | ICD-10-CM | POA: Diagnosis not present

## 2024-03-25 DIAGNOSIS — Z7960 Long term (current) use of unspecified immunomodulators and immunosuppressants: Secondary | ICD-10-CM

## 2024-03-25 DIAGNOSIS — Z87891 Personal history of nicotine dependence: Secondary | ICD-10-CM

## 2024-03-25 DIAGNOSIS — D631 Anemia in chronic kidney disease: Secondary | ICD-10-CM | POA: Diagnosis present

## 2024-03-25 DIAGNOSIS — E039 Hypothyroidism, unspecified: Secondary | ICD-10-CM | POA: Diagnosis present

## 2024-03-25 DIAGNOSIS — N179 Acute kidney failure, unspecified: Secondary | ICD-10-CM | POA: Diagnosis not present

## 2024-03-25 DIAGNOSIS — F32A Depression, unspecified: Secondary | ICD-10-CM | POA: Diagnosis present

## 2024-03-25 DIAGNOSIS — Z7951 Long term (current) use of inhaled steroids: Secondary | ICD-10-CM

## 2024-03-25 DIAGNOSIS — E1122 Type 2 diabetes mellitus with diabetic chronic kidney disease: Secondary | ICD-10-CM | POA: Diagnosis present

## 2024-03-25 DIAGNOSIS — Z823 Family history of stroke: Secondary | ICD-10-CM

## 2024-03-25 DIAGNOSIS — E662 Morbid (severe) obesity with alveolar hypoventilation: Secondary | ICD-10-CM | POA: Diagnosis present

## 2024-03-25 DIAGNOSIS — E872 Acidosis, unspecified: Secondary | ICD-10-CM | POA: Diagnosis present

## 2024-03-25 DIAGNOSIS — K3184 Gastroparesis: Secondary | ICD-10-CM | POA: Diagnosis present

## 2024-03-25 DIAGNOSIS — Z79890 Hormone replacement therapy (postmenopausal): Secondary | ICD-10-CM

## 2024-03-25 DIAGNOSIS — Z8041 Family history of malignant neoplasm of ovary: Secondary | ICD-10-CM

## 2024-03-25 DIAGNOSIS — N1831 Chronic kidney disease, stage 3a: Secondary | ICD-10-CM | POA: Diagnosis present

## 2024-03-25 DIAGNOSIS — G8929 Other chronic pain: Secondary | ICD-10-CM | POA: Diagnosis present

## 2024-03-25 DIAGNOSIS — D649 Anemia, unspecified: Secondary | ICD-10-CM | POA: Diagnosis present

## 2024-03-25 DIAGNOSIS — Z91138 Patient's unintentional underdosing of medication regimen for other reason: Secondary | ICD-10-CM

## 2024-03-25 DIAGNOSIS — Z800 Family history of malignant neoplasm of digestive organs: Secondary | ICD-10-CM

## 2024-03-25 DIAGNOSIS — F419 Anxiety disorder, unspecified: Secondary | ICD-10-CM | POA: Diagnosis present

## 2024-03-25 DIAGNOSIS — Z86718 Personal history of other venous thrombosis and embolism: Secondary | ICD-10-CM

## 2024-03-25 DIAGNOSIS — Z7984 Long term (current) use of oral hypoglycemic drugs: Secondary | ICD-10-CM

## 2024-03-25 DIAGNOSIS — M3500 Sicca syndrome, unspecified: Secondary | ICD-10-CM | POA: Diagnosis present

## 2024-03-25 DIAGNOSIS — E1169 Type 2 diabetes mellitus with other specified complication: Secondary | ICD-10-CM | POA: Diagnosis present

## 2024-03-25 LAB — CBC
HCT: 35.6 % — ABNORMAL LOW (ref 36.0–46.0)
Hemoglobin: 11.2 g/dL — ABNORMAL LOW (ref 12.0–15.0)
MCH: 28.6 pg (ref 26.0–34.0)
MCHC: 31.5 g/dL (ref 30.0–36.0)
MCV: 90.8 fL (ref 80.0–100.0)
Platelets: 229 K/uL (ref 150–400)
RBC: 3.92 MIL/uL (ref 3.87–5.11)
RDW: 14.6 % (ref 11.5–15.5)
WBC: 10.7 K/uL — ABNORMAL HIGH (ref 4.0–10.5)
nRBC: 0 % (ref 0.0–0.2)

## 2024-03-25 LAB — COMPREHENSIVE METABOLIC PANEL WITH GFR
ALT: 10 U/L (ref 0–44)
AST: 16 U/L (ref 15–41)
Albumin: 2.9 g/dL — ABNORMAL LOW (ref 3.5–5.0)
Alkaline Phosphatase: 67 U/L (ref 38–126)
Anion gap: 16 — ABNORMAL HIGH (ref 5–15)
BUN: 12 mg/dL (ref 6–20)
CO2: 19 mmol/L — ABNORMAL LOW (ref 22–32)
Calcium: 8.5 mg/dL — ABNORMAL LOW (ref 8.9–10.3)
Chloride: 108 mmol/L (ref 98–111)
Creatinine, Ser: 1.13 mg/dL — ABNORMAL HIGH (ref 0.44–1.00)
GFR, Estimated: 58 mL/min — ABNORMAL LOW (ref >60)
Glucose, Bld: 120 mg/dL — ABNORMAL HIGH (ref 70–99)
Potassium: 3.1 mmol/L — ABNORMAL LOW (ref 3.5–5.1)
Sodium: 143 mmol/L (ref 135–145)
Total Bilirubin: 1.4 mg/dL — ABNORMAL HIGH (ref 0.0–1.2)
Total Protein: 6.4 g/dL — ABNORMAL LOW (ref 6.5–8.1)

## 2024-03-25 LAB — URINALYSIS, ROUTINE W REFLEX MICROSCOPIC
Bacteria, UA: NONE SEEN
Bilirubin Urine: NEGATIVE
Glucose, UA: >=500 mg/dL — AB
Hgb urine dipstick: NEGATIVE
Ketones, ur: 20 mg/dL — AB
Nitrite: NEGATIVE
Protein, ur: 30 mg/dL — AB
Specific Gravity, Urine: >1.046 — ABNORMAL HIGH (ref 1.005–1.030)
pH: 5 (ref 5.0–8.0)

## 2024-03-25 LAB — LIPASE, BLOOD: Lipase: 26 U/L (ref 11–51)

## 2024-03-25 MED ORDER — SODIUM CHLORIDE 0.9 % IV SOLN
12.5000 mg | Freq: Once | INTRAVENOUS | Status: AC
  Administered 2024-03-26: 12.5 mg via INTRAVENOUS
  Filled 2024-03-25: qty 12.5
  Filled 2024-03-25: qty 0.5

## 2024-03-25 MED ORDER — FENTANYL CITRATE PF 50 MCG/ML IJ SOSY
25.0000 ug | PREFILLED_SYRINGE | Freq: Once | INTRAMUSCULAR | Status: AC
  Administered 2024-03-25: 25 ug via INTRAVENOUS
  Filled 2024-03-25: qty 1

## 2024-03-25 MED ORDER — DICYCLOMINE HCL 10 MG/ML IM SOLN
20.0000 mg | Freq: Once | INTRAMUSCULAR | Status: AC
  Administered 2024-03-25: 20 mg via INTRAMUSCULAR
  Filled 2024-03-25: qty 2

## 2024-03-25 MED ORDER — PROCHLORPERAZINE EDISYLATE 10 MG/2ML IJ SOLN
5.0000 mg | Freq: Once | INTRAMUSCULAR | Status: AC
  Administered 2024-03-25: 5 mg via INTRAVENOUS
  Filled 2024-03-25: qty 2

## 2024-03-25 MED ORDER — DIPHENHYDRAMINE HCL 50 MG/ML IJ SOLN
25.0000 mg | Freq: Once | INTRAMUSCULAR | Status: AC
  Administered 2024-03-25: 25 mg via INTRAVENOUS
  Filled 2024-03-25: qty 1

## 2024-03-25 MED ORDER — DROPERIDOL 2.5 MG/ML IJ SOLN
2.5000 mg | Freq: Once | INTRAMUSCULAR | Status: AC
  Administered 2024-03-25: 2.5 mg via INTRAVENOUS
  Filled 2024-03-25: qty 2

## 2024-03-25 MED ORDER — IOHEXOL 350 MG/ML SOLN
100.0000 mL | Freq: Once | INTRAVENOUS | Status: AC | PRN
  Administered 2024-03-25: 100 mL via INTRAVENOUS

## 2024-03-25 MED ORDER — LACTATED RINGERS IV BOLUS
1000.0000 mL | Freq: Once | INTRAVENOUS | Status: AC
  Administered 2024-03-25: 1000 mL via INTRAVENOUS

## 2024-03-25 MED ORDER — POTASSIUM CHLORIDE CRYS ER 20 MEQ PO TBCR
40.0000 meq | EXTENDED_RELEASE_TABLET | Freq: Once | ORAL | Status: AC
  Administered 2024-03-25: 40 meq via ORAL
  Filled 2024-03-25: qty 2

## 2024-03-25 MED ORDER — ONDANSETRON HCL 4 MG/2ML IJ SOLN: 4.0000 mg | Freq: Once | INTRAMUSCULAR | Status: DC | PRN

## 2024-03-25 NOTE — ED Triage Notes (Signed)
 Pt BIB EMS from home for nausea and vomiting x 1 week. Vomited about 8 times in 24 hours, with some streaks of blood in vomit. Unable to keep anything down including nausea meds. 4mg  Zofran  given by EMS. Aox4.

## 2024-03-25 NOTE — ED Notes (Signed)
 Provider ok for WR. Pt given a warm blanket and rolled to the WR.

## 2024-03-25 NOTE — ED Notes (Signed)
Failed po challenge

## 2024-03-25 NOTE — ED Provider Notes (Signed)
 [place] EMERGENCY DEPARTMENT AT [facility] Provider Note HPI [name] is a 53 y.o. female with a medical history as below who presents for 1 week of persistent nausea, vomiting and inability tolerate p.o. intake.  Denies recent travel, fevers, diarrhea or hematochezia.  She has been having normal bowel movements.  She states that this is her third visit to the emergency department this week and she needs something different for pain.  Past Medical History:  Diagnosis Date   Acute renal failure (HCC)     Intractable nausea vomiting secondary to diabetic gastroparesis causing dehydration and acute renal failure /notes 04/01/2013   Anginal pain (HCC)    Anxiety    Bell's palsy 08/09/2010   Bicornuate uterus    CAP (community acquired pneumonia) 10/2011   thelbert 11/08/2011   Chest pain 01/13/2014   CHF (congestive heart failure) (HCC)    Diabetic gastroparesis associated with type 2 diabetes mellitus (HCC)    this is presumed diagnoses, not confirmed by any studies.    Eczema    Family history of breast cancer    Family history of malignant neoplasm of breast    Family history of malignant neoplasm of ovary    Family history of ovarian cancer    Family history of prostate cancer    GERD (gastroesophageal reflux disease)    High cholesterol    Hypertension    Liver lesion 08/13/2019   Liver mass 2014   biopsied 03/2013 at [facility], not malignant.  is to undergo radiologic ablation of the mass in sept/October 2014.    Lupus    Migraines    maybe a couple times/yr (04/01/2013)   Obesity    Obstructive sleep apnea on CPAP 2011   Oxygen at nights   Pulmonary embolism (HCC) 05/21/2021   SJOGREN'S SYNDROME 08/09/2010   SLE (systemic lupus erythematosus) (HCC)    thelbert 12/01/2010   Past Surgical History:  Procedure Laterality Date   BREAST CYST EXCISION Left 08/2005   epidermoid   CARDIAC CATHETERIZATION  02/07/2012   ECTOPIC PREGNANCY SURGERY  1999    ESOPHAGOGASTRODUODENOSCOPY N/A 02/26/2013   Procedure: ESOPHAGOGASTRODUODENOSCOPY (EGD);  Surgeon: Norleen LOISE Kiang, MD;  Location: [facility] ENDOSCOPY;  Service: Endoscopy;  Laterality: N/A;   ESOPHAGOGASTRODUODENOSCOPY N/A 03/02/2015   Procedure: ESOPHAGOGASTRODUODENOSCOPY (EGD);  Surgeon: Toribio SHAUNNA Cedar, MD;  Location: [facility] ENDOSCOPY;  Service: Endoscopy;  Laterality: N/A;   ESOPHAGOGASTRODUODENOSCOPY N/A 02/23/2021   Procedure: ESOPHAGOGASTRODUODENOSCOPY (EGD);  Surgeon: Dianna Specking, MD;  Location: [facility] ENDOSCOPY;  Service: Endoscopy;  Laterality: N/A;  possible dilation   HERNIA REPAIR     LEFT AND RIGHT HEART CATHETERIZATION WITH CORONARY ANGIOGRAM N/A 02/07/2012   Procedure: LEFT AND RIGHT HEART CATHETERIZATION WITH CORONARY ANGIOGRAM;  Surgeon: Erick JONELLE Bergamo, MD;  Location: [facility] CATH LAB;  Service: Cardiovascular;  Laterality: N/A;   LIVER BIOPSY  03/2013   liver mass/medical hx noted above   MUSCLE BIOPSY     for lupus/notes 12/01/2010   UMBILICAL HERNIA REPAIR  1980's    Review of Systems Pertinent positives and negative findings are listed as part of the History of Present Illness and MDM  Physical Exam Vitals:   03/25/24 1439 03/25/24 1441 03/25/24 1442 03/25/24 1900  BP:   (!) 135/97   Pulse: 99     Resp: 20     Temp: 98.7 F (37.1 C)   98.3 F (36.8 C)  TempSrc: Oral     SpO2: 100%     Weight:  ROLLEN)  149.7 kg    Height:  5' 8 (1.727 m)       Constitutional Nursing notes reviewed Vital signs reviewed  HEENT No obvious trauma Pupils round, equal, and reactive to light. Pupils cross midline Neck supple  Respiratory Effort normal Breathing well on room air CTAB  CV Normal rate and rhythm  No pitting edema  Abdomen Soft, generalized tenderness to deep palpation without rebound or guarding No peritonitis  MSK Atraumatic No obvious deformity ROM appropriate  Neuro Awake and alert Pupils cross midline Moving all extremities    MDM: Medical Decision  Making Problems Addressed: Generalized abdominal pain: complicated acute illness or injury that poses a threat to life or bodily functions Intractable vomiting: complicated acute illness or injury that poses a threat to life or bodily functions  Amount and/or Complexity of Data Reviewed External Data Reviewed: notes. Labs: ordered. Decision-making details documented in ED Course. Radiology: ordered and independent interpretation performed. Decision-making details documented in ED Course.  Risk Prescription drug management.    Initial Differential Diagnoses includes but pancreatitis, gastroenteritis, diabetic gastroparesis, bowel obstruction  I reviewed the patient's vitals, the nursing triage note and evaluated the patient at bedside.   I reviewed the patient's external records which show that this is the patient's third ED visit in 3 days for nausea and vomiting.  She previously responded to droperidol  and was sent home on antiemetics which she says have not been working.    Labs today show that her hemoglobin is stable at 11.2.  She does have mild hypokalemia to 3.1.  Bicarb of 19, likely slight metabolic acidosis from persistent vomiting.  Creatinine is 1.13 which is her baseline.  Lipase within normal limits, lowering consistent for acute pancreatitis.  I reviewed the patient's last echo which was last month and shows LVEF of 65 to 70%.  Given the fact that the patient has not had a CT, I will obtain CT imaging of her abdomen pelvis to look for pathology.  I have very low suspicion for SBO as the patient has been having normal bowel movements.  CT shows no acute abnormality within the abdomen or pelvis.  No evidence of bowel obstruction.  I considered ACS and reviewed/interpreted the EKG. EKG shows normal sinus rhythm with no evidence of acute ischemic changes or high grade conduction block.  There is a PVC.  There is no STEMI or contiguous T wave inversions. No evidence of new-onset  arrythmia. The PR, QRS, and QT intervals are normal.  Patient was given Compazine  and Benadryl  in triage without improvement of her symptoms.  Given response to droperidol  in the past with no evidence of QTc prolongation, patient was given droperidol  and fluids  I considered ectopic pregnancy/IUP with the patient has not had her menstrual period in 5 years.  Will not pursue pregnancy testing.  On reevaluation after fluids, Compazine , Benadryl , droperidol  and Bentyl , she is still unable to tolerate p.o. intake.  I have given her potassium given her hypokalemia to 3.1.  Patient admitted for further workup and care   Procedures: Procedures  Medications administered in the ED: Medications  diphenhydrAMINE  (BENADRYL ) injection 25 mg (25 mg Intravenous Given 03/25/24 1534)  prochlorperazine  (COMPAZINE ) injection 5 mg (5 mg Intravenous Given 03/25/24 1535)  lactated ringers  bolus 1,000 mL (0 mLs Intravenous Stopped 03/25/24 1900)  potassium chloride  SA (KLOR-CON  M) CR tablet 40 mEq (40 mEq Oral Given 03/25/24 1757)  droperidol  (INAPSINE ) 2.5 MG/ML injection 2.5 mg (2.5 mg Intravenous Given 03/25/24 1748)  dicyclomine  (BENTYL ) injection 20 mg (20 mg Intramuscular Given 03/25/24 1748)  iohexol  (OMNIPAQUE ) 350 MG/ML injection 100 mL (100 mLs Intravenous Contrast Given 03/25/24 1813)     Impression: 1. Intractable vomiting   2. Generalized abdominal pain      Patient's presentation is most consistent with acute presentation with potential threat to life or bodily function.  Disposition: ED Disposition:  Linard Dionisio Blunt, MD 03/25/24 2048    Darra Fonda MATSU, MD 03/30/24 [phone]

## 2024-03-26 ENCOUNTER — Encounter (HOSPITAL_COMMUNITY): Payer: Self-pay | Admitting: Internal Medicine

## 2024-03-26 ENCOUNTER — Other Ambulatory Visit (HOSPITAL_COMMUNITY): Payer: Self-pay

## 2024-03-26 DIAGNOSIS — R112 Nausea with vomiting, unspecified: Secondary | ICD-10-CM | POA: Diagnosis present

## 2024-03-26 DIAGNOSIS — F32A Depression, unspecified: Secondary | ICD-10-CM | POA: Diagnosis present

## 2024-03-26 DIAGNOSIS — K219 Gastro-esophageal reflux disease without esophagitis: Secondary | ICD-10-CM | POA: Diagnosis present

## 2024-03-26 DIAGNOSIS — F419 Anxiety disorder, unspecified: Secondary | ICD-10-CM | POA: Diagnosis present

## 2024-03-26 DIAGNOSIS — M3500 Sicca syndrome, unspecified: Secondary | ICD-10-CM | POA: Diagnosis present

## 2024-03-26 DIAGNOSIS — E1169 Type 2 diabetes mellitus with other specified complication: Secondary | ICD-10-CM

## 2024-03-26 DIAGNOSIS — I130 Hypertensive heart and chronic kidney disease with heart failure and stage 1 through stage 4 chronic kidney disease, or unspecified chronic kidney disease: Secondary | ICD-10-CM | POA: Diagnosis present

## 2024-03-26 DIAGNOSIS — K3184 Gastroparesis: Secondary | ICD-10-CM | POA: Diagnosis present

## 2024-03-26 DIAGNOSIS — L89320 Pressure ulcer of left buttock, unstageable: Secondary | ICD-10-CM | POA: Diagnosis present

## 2024-03-26 DIAGNOSIS — N1831 Chronic kidney disease, stage 3a: Secondary | ICD-10-CM | POA: Diagnosis present

## 2024-03-26 DIAGNOSIS — E876 Hypokalemia: Secondary | ICD-10-CM | POA: Diagnosis present

## 2024-03-26 DIAGNOSIS — E039 Hypothyroidism, unspecified: Secondary | ICD-10-CM

## 2024-03-26 DIAGNOSIS — G2581 Restless legs syndrome: Secondary | ICD-10-CM | POA: Diagnosis present

## 2024-03-26 DIAGNOSIS — E785 Hyperlipidemia, unspecified: Secondary | ICD-10-CM

## 2024-03-26 DIAGNOSIS — E662 Morbid (severe) obesity with alveolar hypoventilation: Secondary | ICD-10-CM | POA: Diagnosis present

## 2024-03-26 DIAGNOSIS — I5042 Chronic combined systolic (congestive) and diastolic (congestive) heart failure: Secondary | ICD-10-CM | POA: Diagnosis present

## 2024-03-26 DIAGNOSIS — E1143 Type 2 diabetes mellitus with diabetic autonomic (poly)neuropathy: Secondary | ICD-10-CM | POA: Diagnosis present

## 2024-03-26 DIAGNOSIS — E872 Acidosis, unspecified: Secondary | ICD-10-CM | POA: Diagnosis present

## 2024-03-26 DIAGNOSIS — Z794 Long term (current) use of insulin: Secondary | ICD-10-CM | POA: Diagnosis not present

## 2024-03-26 DIAGNOSIS — N179 Acute kidney failure, unspecified: Secondary | ICD-10-CM | POA: Diagnosis not present

## 2024-03-26 DIAGNOSIS — E7800 Pure hypercholesterolemia, unspecified: Secondary | ICD-10-CM | POA: Diagnosis present

## 2024-03-26 DIAGNOSIS — M329 Systemic lupus erythematosus, unspecified: Secondary | ICD-10-CM | POA: Diagnosis present

## 2024-03-26 DIAGNOSIS — Z6841 Body Mass Index (BMI) 40.0 and over, adult: Secondary | ICD-10-CM | POA: Diagnosis not present

## 2024-03-26 DIAGNOSIS — E1122 Type 2 diabetes mellitus with diabetic chronic kidney disease: Secondary | ICD-10-CM | POA: Diagnosis present

## 2024-03-26 DIAGNOSIS — D631 Anemia in chronic kidney disease: Secondary | ICD-10-CM | POA: Diagnosis present

## 2024-03-26 DIAGNOSIS — D84821 Immunodeficiency due to drugs: Secondary | ICD-10-CM | POA: Diagnosis present

## 2024-03-26 LAB — BASIC METABOLIC PANEL WITH GFR
Anion gap: 13 (ref 5–15)
BUN: 11 mg/dL (ref 6–20)
CO2: 21 mmol/L — ABNORMAL LOW (ref 22–32)
Calcium: 8.7 mg/dL — ABNORMAL LOW (ref 8.9–10.3)
Chloride: 107 mmol/L (ref 98–111)
Creatinine, Ser: 1.24 mg/dL — ABNORMAL HIGH (ref 0.44–1.00)
GFR, Estimated: 52 mL/min — ABNORMAL LOW (ref >60)
Glucose, Bld: 102 mg/dL — ABNORMAL HIGH (ref 70–99)
Potassium: 3.8 mmol/L (ref 3.5–5.1)
Sodium: 141 mmol/L (ref 135–145)

## 2024-03-26 LAB — CBC WITH DIFFERENTIAL/PLATELET
Abs Immature Granulocytes: 0.04 K/uL (ref 0.00–0.07)
Basophils Absolute: 0.1 K/uL (ref 0.0–0.1)
Basophils Relative: 1 %
Eosinophils Absolute: 0.5 K/uL (ref 0.0–0.5)
Eosinophils Relative: 4 %
HCT: 33.3 % — ABNORMAL LOW (ref 36.0–46.0)
Hemoglobin: 10.3 g/dL — ABNORMAL LOW (ref 12.0–15.0)
Immature Granulocytes: 0 %
Lymphocytes Relative: 27 %
Lymphs Abs: 2.7 K/uL (ref 0.7–4.0)
MCH: 28.5 pg (ref 26.0–34.0)
MCHC: 30.9 g/dL (ref 30.0–36.0)
MCV: 92 fL (ref 80.0–100.0)
Monocytes Absolute: 0.9 K/uL (ref 0.1–1.0)
Monocytes Relative: 9 %
Neutro Abs: 6 K/uL (ref 1.7–7.7)
Neutrophils Relative %: 59 %
Platelets: 184 K/uL (ref 150–400)
RBC: 3.62 MIL/uL — ABNORMAL LOW (ref 3.87–5.11)
RDW: 14.6 % (ref 11.5–15.5)
WBC: 10.2 K/uL (ref 4.0–10.5)
nRBC: 0 % (ref 0.0–0.2)

## 2024-03-26 LAB — CBG MONITORING, ED
Glucose-Capillary: 93 mg/dL (ref 70–99)
Glucose-Capillary: 136 mg/dL — ABNORMAL HIGH (ref 70–99)
Glucose-Capillary: 139 mg/dL — ABNORMAL HIGH (ref 70–99)

## 2024-03-26 LAB — HEPATIC FUNCTION PANEL
ALT: 9 U/L (ref 0–44)
AST: 16 U/L (ref 15–41)
Albumin: 2.8 g/dL — ABNORMAL LOW (ref 3.5–5.0)
Alkaline Phosphatase: 64 U/L (ref 38–126)
Bilirubin, Direct: 0.3 mg/dL — ABNORMAL HIGH (ref 0.0–0.2)
Indirect Bilirubin: 0.8 mg/dL (ref 0.3–0.9)
Total Bilirubin: 1.1 mg/dL (ref 0.0–1.2)
Total Protein: 5.8 g/dL — ABNORMAL LOW (ref 6.5–8.1)

## 2024-03-26 LAB — TSH: TSH: 1.595 u[IU]/mL (ref 0.350–4.500)

## 2024-03-26 LAB — GLUCOSE, CAPILLARY
Glucose-Capillary: 131 mg/dL — ABNORMAL HIGH (ref 70–99)
Glucose-Capillary: 123 mg/dL — ABNORMAL HIGH (ref 70–99)

## 2024-03-26 MED ORDER — PANTOPRAZOLE SODIUM 40 MG IV SOLR
40.0000 mg | INTRAVENOUS | Status: DC
  Administered 2024-03-26 – 2024-03-30 (×5): 40 mg via INTRAVENOUS
  Filled 2024-03-26 (×5): qty 10

## 2024-03-26 MED ORDER — HYDROMORPHONE HCL 1 MG/ML IJ SOLN
0.5000 mg | INTRAMUSCULAR | Status: AC | PRN
  Administered 2024-03-26: 0.5 mg via INTRAVENOUS
  Filled 2024-03-26: qty 1

## 2024-03-26 MED ORDER — ONDANSETRON HCL 4 MG/2ML IJ SOLN
4.0000 mg | Freq: Four times a day (QID) | INTRAMUSCULAR | Status: DC | PRN
  Administered 2024-03-26 – 2024-03-27 (×4): 4 mg via INTRAVENOUS
  Filled 2024-03-26 (×4): qty 2

## 2024-03-26 MED ORDER — METOCLOPRAMIDE HCL 5 MG/ML IJ SOLN: 10.0000 mg | Freq: Three times a day (TID) | INTRAMUSCULAR | Status: DC

## 2024-03-26 MED ORDER — POTASSIUM CHLORIDE IN NACL 20-0.9 MEQ/L-% IV SOLN
INTRAVENOUS | Status: AC
  Filled 2024-03-26 (×3): qty 1000

## 2024-03-26 MED ORDER — SODIUM CHLORIDE 0.9 % IV SOLN: INTRAVENOUS | Status: DC

## 2024-03-26 MED ORDER — SPIRONOLACTONE 12.5 MG HALF TABLET
12.5000 mg | ORAL_TABLET | Freq: Every day | ORAL | Status: DC
Start: 2024-03-26 — End: 2024-04-04
  Administered 2024-03-27 – 2024-04-03 (×7): 12.5 mg via ORAL
  Filled 2024-03-26 (×9): qty 1

## 2024-03-26 MED ORDER — DICYCLOMINE HCL 10 MG/ML IM SOLN
20.0000 mg | Freq: Once | INTRAMUSCULAR | Status: AC
  Administered 2024-03-26: 20 mg via INTRAMUSCULAR
  Filled 2024-03-26: qty 2

## 2024-03-26 MED ORDER — FLUTICASONE FUROATE-VILANTEROL 200-25 MCG/ACT IN AEPB
1.0000 | INHALATION_SPRAY | Freq: Every day | RESPIRATORY_TRACT | Status: DC
  Administered 2024-03-26 – 2024-04-04 (×8): 1 via RESPIRATORY_TRACT
  Filled 2024-03-26: qty 28

## 2024-03-26 MED ORDER — CARVEDILOL 25 MG PO TABS
25.0000 mg | ORAL_TABLET | Freq: Two times a day (BID) | ORAL | Status: DC
  Administered 2024-03-26 – 2024-04-04 (×12): 25 mg via ORAL
  Filled 2024-03-26 (×12): qty 1

## 2024-03-26 MED ORDER — HYDROMORPHONE HCL 1 MG/ML IJ SOLN
0.5000 mg | INTRAMUSCULAR | Status: DC | PRN
  Administered 2024-03-26: 0.5 mg via INTRAVENOUS
  Filled 2024-03-26 (×2): qty 1

## 2024-03-26 MED ORDER — ENOXAPARIN SODIUM 150 MG/ML IJ SOSY
150.0000 mg | PREFILLED_SYRINGE | Freq: Two times a day (BID) | INTRAMUSCULAR | Status: DC
  Administered 2024-03-26 – 2024-04-01 (×13): 150 mg via SUBCUTANEOUS
  Filled 2024-03-26 (×15): qty 1

## 2024-03-26 MED ORDER — SACUBITRIL-VALSARTAN 97-103 MG PO TABS
1.0000 | ORAL_TABLET | Freq: Two times a day (BID) | ORAL | Status: DC
  Administered 2024-03-27 – 2024-04-04 (×12): 1 via ORAL
  Filled 2024-03-26 (×20): qty 1

## 2024-03-26 MED ORDER — ROSUVASTATIN CALCIUM 20 MG PO TABS
20.0000 mg | ORAL_TABLET | Freq: Every day | ORAL | Status: DC
  Administered 2024-03-27 – 2024-04-04 (×9): 20 mg via ORAL
  Filled 2024-03-26 (×9): qty 1

## 2024-03-26 MED ORDER — LEVOTHYROXINE SODIUM 50 MCG PO TABS
50.0000 ug | ORAL_TABLET | Freq: Every day | ORAL | Status: DC
  Administered 2024-03-26 – 2024-04-04 (×9): 50 ug via ORAL
  Filled 2024-03-26 (×7): qty 1
  Filled 2024-03-26: qty 2
  Filled 2024-03-26: qty 1

## 2024-03-26 MED ORDER — ISOSORB DINITRATE-HYDRALAZINE 20-37.5 MG PO TABS
1.0000 | ORAL_TABLET | Freq: Three times a day (TID) | ORAL | Status: DC
  Administered 2024-03-26 – 2024-04-04 (×15): 1 via ORAL
  Filled 2024-03-26 (×18): qty 1

## 2024-03-26 MED ORDER — HYDRALAZINE HCL 20 MG/ML IJ SOLN: 10.0000 mg | INTRAMUSCULAR | Status: DC | PRN

## 2024-03-26 MED ORDER — METOCLOPRAMIDE HCL 5 MG/ML IJ SOLN
5.0000 mg | Freq: Once | INTRAMUSCULAR | Status: AC
  Administered 2024-03-26: 5 mg via INTRAVENOUS
  Filled 2024-03-26: qty 2

## 2024-03-26 MED ORDER — METOCLOPRAMIDE HCL 5 MG/ML IJ SOLN
10.0000 mg | Freq: Three times a day (TID) | INTRAMUSCULAR | Status: DC
  Administered 2024-03-26 – 2024-04-01 (×19): 10 mg via INTRAVENOUS
  Filled 2024-03-26 (×20): qty 2

## 2024-03-26 MED ORDER — INSULIN ASPART 100 UNIT/ML IJ SOLN
0.0000 [IU] | INTRAMUSCULAR | Status: DC
  Administered 2024-03-26 (×4): 1 [IU] via SUBCUTANEOUS
  Administered 2024-03-27: 2 [IU] via SUBCUTANEOUS
  Administered 2024-03-27 (×2): 1 [IU] via SUBCUTANEOUS
  Administered 2024-03-27: 2 [IU] via SUBCUTANEOUS
  Administered 2024-03-28 (×2): 1 [IU] via SUBCUTANEOUS
  Administered 2024-03-28: 2 [IU] via SUBCUTANEOUS
  Administered 2024-03-28 (×2): 1 [IU] via SUBCUTANEOUS
  Administered 2024-03-28: 2 [IU] via SUBCUTANEOUS
  Administered 2024-03-29 (×2): 1 [IU] via SUBCUTANEOUS
  Administered 2024-03-29 – 2024-03-30 (×3): 2 [IU] via SUBCUTANEOUS
  Administered 2024-03-31 (×3): 1 [IU] via SUBCUTANEOUS
  Administered 2024-04-01: 2 [IU] via SUBCUTANEOUS
  Administered 2024-04-01: 3 [IU] via SUBCUTANEOUS
  Administered 2024-04-01: 2 [IU] via SUBCUTANEOUS
  Administered 2024-04-02: 1 [IU] via SUBCUTANEOUS

## 2024-03-26 MED ORDER — MORPHINE SULFATE (PF) 2 MG/ML IV SOLN
1.0000 mg | INTRAVENOUS | Status: DC | PRN
  Administered 2024-03-26 – 2024-03-29 (×15): 1 mg via INTRAVENOUS
  Filled 2024-03-26 (×15): qty 1

## 2024-03-26 MED ORDER — DIPHENHYDRAMINE HCL 50 MG/ML IJ SOLN
25.0000 mg | Freq: Once | INTRAMUSCULAR | Status: AC
  Administered 2024-03-26: 25 mg via INTRAVENOUS
  Filled 2024-03-26: qty 1

## 2024-03-26 MED ORDER — ALBUTEROL SULFATE (2.5 MG/3ML) 0.083% IN NEBU: 2.5000 mg | INHALATION_SOLUTION | Freq: Four times a day (QID) | RESPIRATORY_TRACT | Status: DC | PRN

## 2024-03-26 NOTE — Progress Notes (Signed)
 This is a pleasant 53 year old lady with multiple medical problems mainly type 2 diabetes mellitus and presumed gastroparesis presented to ED with nausea and vomiting and poor p.o. intake for last 8 days.  She also had some abdominal pain.  CT abdomen and pelvis was done which was unremarkable.  She was eventually admitted under hospital service after midnight for intractable nausea vomiting secondary to gastroparesis.  She received only 1 dose of Reglan  5 mg.  Patient seen and examined in the ED.  She still complains of nausea vomiting, she claims that she has had at least 10 episodes of vomiting in last few hours.  She was holding the vomiting bag when I entered the room.  I am thus going to start her on Reglan  10 mg IV 3 times daily.  She tells me that she has been previously told by her previous doctors to take Reglan  only as needed at home.  She will likely need to be on Reglan  scheduled instead.  We will see if we can do gastric emptying study on her while she is here because the last 1 was done in 2021 which was apparently negative.  We will continue SSI for now to control her diabetes and hold metformin .  For DVT, she is currently on Lovenox  since she is unable to take anything p.o./Eliquis .  Her blood pressure is slightly elevated and she is on several medications which are resumed.  Not sure if she will be able to take them.  I will place her on IV as needed hydralazine  in the meantime.  She has chronic anemia and hemoglobin is at baseline.  She also has CKD stage IIIa which is at her baseline as well.  She did have mild leukocytosis yesterday which is resolved.  Hypokalemia replenished and resolved as well.  Since she is at risk of dehydration, I will start her on gentle hydration as well.  Continue on liquid diet.  Total time spent 39 minutes

## 2024-03-26 NOTE — H&P (Signed)
 History and Physical    SANJUANITA [name] FMW:[id number] DOB: 12-02-1970 DOA: 03/25/2024  Patient coming from: Home.  Chief Complaint: Nausea vomiting.  HPI: [name] is a 53 y.o. female with history of diabetes mellitus type 2, chronic HFpEF, chronic kidney disease stage III, history of lupus on immunosuppressants, history of DVT, chronic anemia, morbid obesity OSA recurrent admissions recently for nausea vomiting presumed to be from gastroparesis and as per the report patient had negative gastric emptying study in 2021 recently admitted again for acute encephalopathy likely from polypharmacy states that she has been unable to keep anything for the last 8 days and was not able to take her medications also.  She has been having persistent nausea vomiting did move her bowels yesterday.  Denies any blood in the vomitus or bowel movement.  Has diffuse abdominal pain.  ED Course: In the ER CT abdomen pelvis is unremarkable.  Patient had multiple episodes of vomiting in the ER despite multiple antiemetics doses given admitted for further observation.  Labs show total bilirubin of 1.4 albumin  2.9 lipase 26 AST 16 ALT 10.  Potassium was 3.1 replaced and ordered for creatinine 1.1.  Hemoglobin 11.2.  Review of Systems: As per HPI, rest all negative.   Past Medical History:  Diagnosis Date   Acute renal failure (HCC)     Intractable nausea vomiting secondary to diabetic gastroparesis causing dehydration and acute renal failure /notes 04/01/2013   Anginal pain (HCC)    Anxiety    Bell's palsy 08/09/2010   Bicornuate uterus    CAP (community acquired pneumonia) 10/2011   thelbert 11/08/2011   Chest pain 01/13/2014   CHF (congestive heart failure) (HCC)    Diabetic gastroparesis associated with type 2 diabetes mellitus (HCC)    this is presumed diagnoses, not confirmed by any studies.    Eczema    Family history of breast cancer    Family history of malignant neoplasm of breast    Family history of  malignant neoplasm of ovary    Family history of ovarian cancer    Family history of prostate cancer    GERD (gastroesophageal reflux disease)    High cholesterol    Hypertension    Liver lesion 08/13/2019   Liver mass 2014   biopsied 03/2013 at [facility], not malignant.  is to undergo radiologic ablation of the mass in sept/October 2014.    Lupus    Migraines    maybe a couple times/yr (04/01/2013)   Obesity    Obstructive sleep apnea on CPAP 2011   Oxygen at nights   Pulmonary embolism (HCC) 05/21/2021   SJOGREN'S SYNDROME 08/09/2010   SLE (systemic lupus erythematosus) (HCC)    thelbert 12/01/2010    Past Surgical History:  Procedure Laterality Date   BREAST CYST EXCISION Left 08/2005   epidermoid   CARDIAC CATHETERIZATION  02/07/2012   ECTOPIC PREGNANCY SURGERY  1999   ESOPHAGOGASTRODUODENOSCOPY N/A 02/26/2013   Procedure: ESOPHAGOGASTRODUODENOSCOPY (EGD);  Surgeon: Norleen LOISE Kiang, MD;  Location: [facility] ENDOSCOPY;  Service: Endoscopy;  Laterality: N/A;   ESOPHAGOGASTRODUODENOSCOPY N/A 03/02/2015   Procedure: ESOPHAGOGASTRODUODENOSCOPY (EGD);  Surgeon: Toribio SHAUNNA Cedar, MD;  Location: [facility] ENDOSCOPY;  Service: Endoscopy;  Laterality: N/A;   ESOPHAGOGASTRODUODENOSCOPY N/A 02/23/2021   Procedure: ESOPHAGOGASTRODUODENOSCOPY (EGD);  Surgeon: Dianna Specking, MD;  Location: [facility] ENDOSCOPY;  Service: Endoscopy;  Laterality: N/A;  possible dilation   HERNIA REPAIR     LEFT AND RIGHT HEART CATHETERIZATION WITH CORONARY ANGIOGRAM N/A 02/07/2012   Procedure:  LEFT AND RIGHT HEART CATHETERIZATION WITH CORONARY ANGIOGRAM;  Surgeon: Erick JONELLE Bergamo, MD;  Location: [facility] CATH LAB;  Service: Cardiovascular;  Laterality: N/A;   LIVER BIOPSY  03/2013   liver mass/medical hx noted above   MUSCLE BIOPSY     for lupus/notes 12/01/2010   UMBILICAL HERNIA REPAIR  1980's     reports that she quit smoking about 32 years ago. Her smoking use included cigarettes. She started smoking about 35 years ago. She  has a 1.5 pack-year smoking history. She has never used smokeless tobacco. She reports that she does not drink alcohol  and does not use drugs.  Allergies  Allergen Reactions   Metoclopramide  Other (See Comments)    Developed restless leg, akathisia type limb movements.    Codeine Itching and Rash   Hydrocodone  Rash   Percocet [Oxycodone -Acetaminophen ] Hives and Rash    Tolerates dilaudid , Tolerates acetaminophen      Family History  Problem Relation Age of Onset   Diabetes Mother    Asthma Mother    Urticaria Mother    Hypertension Mother    Stroke Mother    Ovarian cancer Sister 85       maternal half-sister; RAD51C positive   Asthma Brother    Asthma Brother    Breast cancer Maternal Aunt 70       currently 28   Cancer Maternal Aunt        unk. primary   Cancer Maternal Aunt        unk. primary   Ovarian cancer Maternal Aunt        deceased 32s   Breast cancer Maternal Aunt        deceased 48s   Breast cancer Maternal Aunt    Stomach cancer Maternal Uncle        dx 101s; deceased   Prostate cancer Maternal Uncle        deceased 37s   Breast cancer Cousin        deceased 41s; daughter of mat uncle with stomach ca   Hypertension Other    Stroke Other    Arthritis Other    Allergic rhinitis Neg Hx    Angioedema Neg Hx     Prior to Admission medications   Medication Sig Start Date End Date Taking? Authorizing Provider  ADVAIR HFA 230-21 MCG/ACT inhaler Inhale 2 puffs into the lungs in the morning, at noon, and at bedtime. 12/20/20   [provider]  albuterol  (PROVENTIL ) (2.5 MG/3ML) 0.083% nebulizer solution Take 2.5 mg by nebulization as needed for wheezing.    [provider]  apixaban  (ELIQUIS ) 2.5 MG TABS tablet Take 1 tablet (2.5 mg total) by mouth 2 (two) times daily. 02/29/24   [name] Sabas RAMAN, MD  Azathioprine  75 MG TABS Take 1 tablet by mouth 2 (two) times daily. 01/19/23   [provider]  B-D UF III MINI PEN NEEDLES 31G X 5 MM MISC  USE AS DIRECTED TWICE A DAY 10/31/18   Kassie Mallick, MD  BENLYSTA 200 MG/ML SOAJ INJECT THE CONTENTS OF 1 PEN INTO THE SKIN EVERY 7 DAYS. 01/31/24 01/30/25  [provider]  carvedilol  (COREG ) 25 MG tablet Take 1 tablet (25 mg total) by mouth 2 (two) times daily. 03/15/24 06/14/24  Bergamo Heinz, MD  collagenase  (SANTYL ) 250 UNIT/GM ointment Apply topically daily. 03/01/24   [name] Sabas RAMAN, MD  cycloSPORINE  (RESTASIS ) 0.05 % ophthalmic emulsion Place 1 drop into both eyes in the morning, at noon, and at bedtime.  [provider]  diclofenac  Sodium (VOLTAREN ) 1 % GEL APPLY 2 GRAMS TO AFFECTED AREA 4 TIMES A DAY Patient taking differently: Apply 1 Application topically daily as needed (pain). 01/11/22   Cantwell, Celeste C, PA-C  EASY TOUCH PEN NEEDLES 32G X 4 MM MISC USE TO INJECT INSULIN  TWICE DAILY 08/07/19   Kassie Mallick, MD  empagliflozin  (JARDIANCE ) 10 MG TABS tablet Take 1 tablet (10 mg total) by mouth daily. 03/01/24   [name] Sabas RAMAN, MD  famotidine  (PEPCID ) 20 MG tablet Take 20 mg by mouth 2 (two) times daily.    [provider]  fluticasone  (FLONASE ) 50 MCG/ACT nasal spray Place 2 sprays into both nostrils daily as needed for allergies or rhinitis.    [provider]  glucose blood (ONETOUCH ULTRA) test strip Test blood sugar 4 times a day 03/30/22   Von Pacific, MD  hydroxychloroquine  (PLAQUENIL ) 200 MG tablet Take 200 mg by mouth 2 (two) times daily.    [provider]  insulin  regular human CONCENTRATED (HUMULIN  R U-500 KWIKPEN) 500 UNIT/ML KwikPen 100 UNITS, 30 MINUTES BEFORE EACH MEAL Patient taking differently: Inject 30 Units into the skin 3 (three) times daily with meals. 11/17/22   Von Pacific, MD  Insulin  Syringe-Needle U-100 (EASY TOUCH INSULIN  SYRINGE) 31G X 5/16 1 ML MISC USE AS DIRECTED THREE TIMES A DAY 10/12/18   Kassie Mallick, MD  isosorbide -hydrALAZINE  (BIDIL ) 20-37.5 MG tablet Take 1 tablet by mouth 3 (three) times daily. 03/18/24   Ladona Heinz,  MD  levothyroxine  (SYNTHROID ) 50 MCG tablet Take 1 tablet (50 mcg total) by mouth daily at 6 (six) AM. 08/30/19   Sherrill Cable Latif, DO  meclizine  (ANTIVERT ) 12.5 MG tablet Take 1 tablet (12.5 mg total) by mouth 3 (three) times daily as needed for dizziness. 05/03/23   Regalado, Belkys A, MD  metFORMIN  (GLUCOPHAGE -XR) 500 MG 24 hr tablet Take 2 tablets (1,000 mg total) by mouth 2 (two) times daily. 02/02/21   Kassie Mallick, MD  metoCLOPramide  (REGLAN ) 10 MG tablet Take 1 tablet (10 mg total) by mouth 3 (three) times daily before meals. Patient taking differently: Take 20 mg by mouth daily as needed for vomiting or nausea. 05/01/23   Krishnan, Gokul, MD  ondansetron  (ZOFRAN -ODT) 4 MG disintegrating tablet Take 1 tablet (4 mg total) by mouth every 8 (eight) hours as needed. 03/22/24   Jarold Olam HERO, PA-C  oxybutynin  (DITROPAN -XL) 5 MG 24 hr tablet Take 5 mg by mouth daily.    [provider]  pantoprazole  (PROTONIX ) 40 MG tablet Take 1 tablet (40 mg total) by mouth daily at 12 noon. Patient taking differently: Take 40 mg by mouth daily. 05/04/23   Regalado, Belkys A, MD  promethazine  (PHENERGAN ) 25 MG suppository Place 1 suppository (25 mg total) rectally every 6 (six) hours as needed for nausea or vomiting. 03/22/24   Jarold Olam HERO, PA-C  promethazine  (PHENERGAN ) 25 MG tablet Take 1 tablet (25 mg total) by mouth every 6 (six) hours as needed for nausea or vomiting. 03/24/24   Hildegard Loge, PA-C  rosuvastatin  (CRESTOR ) 20 MG tablet Take 1 tablet (20 mg total) by mouth daily. 02/29/24 05/29/24  [name] Sabas RAMAN, MD  sacubitril -valsartan  (ENTRESTO ) 97-103 MG Take 1 tablet by mouth 2 (two) times daily. 03/15/24   Ladona Heinz, MD  spironolactone  (ALDACTONE ) 25 MG tablet Take 0.5 tablets (12.5 mg total) by mouth daily. 03/01/24   [name] Sabas RAMAN, MD  topiramate  (TOPAMAX ) 25 MG tablet Take 75 mg by mouth at  bedtime. 03/17/16   [provider]  torsemide  (DEMADEX ) 20 MG tablet Take 1 tablet (20 mg total) by  mouth daily as needed (as needed for weight gain of 5-10 lbs, worsening shortness of breath, or worsening lower extremity edema.). 02/29/24   [name] Sabas RAMAN, MD  Vitamin D, Ergocalciferol, (DRISDOL) 1.25 MG (50000 UNIT) CAPS capsule Take 50,000 Units by mouth once a week. 09/20/22   [provider]    Physical Exam: Constitutional: Moderately built and nourished. Vitals:   03/26/24 0015 03/26/24 0023 03/26/24 0100 03/26/24 0200  BP: (!) 146/77  (!) 147/87 (!) 157/79  Pulse:   85 85  Resp: 17  14 14   Temp:  98.5 F (36.9 C)  98.7 F (37.1 C)  TempSrc:  Oral  Oral  SpO2:    100%  Weight:      Height:       Eyes: Anicteric no pallor. ENMT: No discharge from the ears eyes nose or mouth. Neck: No mass felt.  No neck rigidity. Respiratory: No rhonchi or crepitations. Cardiovascular: S1-S2 heard. Abdomen: Soft nontender bowel sound present. Musculoskeletal: No edema. Skin: Has a ischial wound. Neurologic: Alert awake oriented to time place and person.  Moves all extremities. Psychiatric: Appears normal.  Normal affect.   Labs on Admission: I have personally reviewed following labs and imaging studies  CBC: Recent Labs  Lab 03/22/24 0229 03/24/24 1404 03/25/24 1446  WBC 9.4 10.3 10.7*  NEUTROABS 6.5  --   --   HGB 10.3* 11.1* 11.2*  HCT 33.7* 36.4 35.6*  MCV 91.1 90.8 90.8  PLT 241 248 229   Basic Metabolic Panel: Recent Labs  Lab 03/22/24 0229 03/24/24 1404 03/25/24 1446  NA 139 143 143  K 3.0* 3.5 3.1*  CL 104 109 108  CO2 22 21* 19*  GLUCOSE 167* 125* 120*  BUN 12 11 12   CREATININE 1.08* 1.06* 1.13*  CALCIUM  9.0 8.9 8.5*  MG 2.0  --   --    GFR: Estimated Creatinine Clearance: 89.3 mL/min (A) (by C-G formula based on SCr of 1.13 mg/dL (H)). Liver Function Tests: Recent Labs  Lab 03/22/24 0229 03/24/24 1404 03/25/24 1446  AST 16 18 16   ALT 10 10 10   ALKPHOS 74 75 67  BILITOT 1.2 1.3* 1.4*  PROT 7.2 6.9 6.4*  ALBUMIN  3.3* 3.3* 2.9*   Recent  Labs  Lab 03/22/24 0229 03/24/24 1404 03/25/24 1446  LIPASE 31 24 26    No results for input(s): AMMONIA in the last 168 hours. Coagulation Profile: No results for input(s): INR, PROTIME in the last 168 hours. Cardiac Enzymes: No results for input(s): CKTOTAL, CKMB, CKMBINDEX, TROPONINI in the last 168 hours. BNP (last 3 results) Recent Labs    07/20/23 1513  PROBNP 74   HbA1C: No results for input(s): HGBA1C in the last 72 hours. CBG: No results for input(s): GLUCAP in the last 168 hours. Lipid Profile: No results for input(s): CHOL, HDL, LDLCALC, TRIG, CHOLHDL, LDLDIRECT in the last 72 hours. Thyroid  Function Tests: No results for input(s): TSH, T4TOTAL, FREET4, T3FREE, THYROIDAB in the last 72 hours. Anemia Panel: No results for input(s): VITAMINB12, FOLATE, FERRITIN, TIBC, IRON, RETICCTPCT in the last 72 hours. Urine analysis:    Component Value Date/Time   COLORURINE AMBER (A) 03/25/2024 2117   APPEARANCEUR HAZY (A) 03/25/2024 2117   LABSPEC >1.046 (H) 03/25/2024 2117   PHURINE 5.0 03/25/2024 2117   GLUCOSEU >=500 (A) 03/25/2024 2117   HGBUR NEGATIVE 03/25/2024 2117  BILIRUBINUR NEGATIVE 03/25/2024 2117   KETONESUR 20 (A) 03/25/2024 2117   PROTEINUR 30 (A) 03/25/2024 2117   UROBILINOGEN 0.2 02/27/2015 1733   NITRITE NEGATIVE 03/25/2024 2117   LEUKOCYTESUR TRACE (A) 03/25/2024 2117   Sepsis Labs: @LABRCNTIP (procalcitonin:4,lacticidven:4) )No results found for this or any previous visit (from the past 240 hours).   Radiological Exams on Admission: CT ABDOMEN PELVIS W CONTRAST Result Date: 03/25/2024 CLINICAL DATA:  intractible vomiting. EXAM: CT ABDOMEN AND PELVIS WITH CONTRAST TECHNIQUE: Multidetector CT imaging of the abdomen and pelvis was performed using the standard protocol following bolus administration of intravenous contrast. RADIATION DOSE REDUCTION: This exam was performed according to the departmental  dose-optimization program which includes automated exposure control, adjustment of the mA and/or kV according to patient size and/or use of iterative reconstruction technique. CONTRAST:  OMNIPAQUE  IOHEXOL  350 MG/ML SOLN COMPARISON:  CT scan abdomen and pelvis from 02/23/2024. FINDINGS: Lower chest: There are atelectatic changes at the left lung base. Bilateral visualized lungs are otherwise clear. No overt consolidation. No pleural effusion. The heart is normal in size. No pericardial effusion. Hepatobiliary: The liver is normal in size. Non-cirrhotic configuration. No suspicious mass. There is an ill-defined hypoattenuating approximately 7 x 9 mm area in the right hepatic lobe, which is too small to adequately characterize. No intrahepatic or extrahepatic bile duct dilation. No calcified gallstones. Normal gallbladder wall thickness. No pericholecystic inflammatory changes. Pancreas: Unremarkable. No pancreatic ductal dilatation or surrounding inflammatory changes. Spleen: Within normal limits. No focal lesion. Adrenals/Urinary Tract: Adrenal glands are unremarkable. No suspicious renal mass. There is a 2 mm nonobstructing calculus in the right kidney upper pole. No other nephroureterolithiasis or obstructive uropathy on either side. Urinary bladder is under distended, precluding optimal assessment. However, no large mass or stones identified. No perivesical fat stranding. Stomach/Bowel: There is a small sliding hiatal hernia. No disproportionate dilation of the small or large bowel loops. No evidence of abnormal bowel wall thickening or inflammatory changes. The appendix is unremarkable. Vascular/Lymphatic: No ascites or pneumoperitoneum. No abdominal or pelvic lymphadenopathy, by size criteria. No aneurysmal dilation of the major abdominal arteries. Reproductive: The uterus is unremarkable. No large adnexal mass. Other: The visualized soft tissues and abdominal wall are unremarkable. Musculoskeletal: No  suspicious osseous lesions. There are mild multilevel degenerative changes in the visualized spine. Since the prior study there is new subtle superior endplate sclerosis/deformity of the L4 vertebral body. However, no associated retropulsion or spinal canal compromise. IMPRESSION: 1. No acute inflammatory process identified within the abdomen or pelvis. No bowel obstruction. 2. There is new subtle superior endplate sclerosis/deformity of the L4 vertebral body. However, no associated retropulsion or spinal canal compromise. 3. Multiple other nonacute observations, as described above. Electronically Signed   By: Ree Molt M.D.   On: 03/25/2024 18:25    EKG: Independently reviewed.  Normal sinus rhythm PVCs.  Assessment/Plan Active Problems:   Acute renal failure superimposed on stage 3a chronic kidney disease (HCC)   SJOGREN'S SYNDROME   Chronic combined systolic and diastolic CHF (congestive heart failure) (HCC)   Generalized abdominal pain   Reflux esophagitis   Diabetic gastroparesis (HCC)   Type 2 diabetes mellitus with hyperlipidemia (HCC)   Obesity hypoventilation syndrome (HCC)   Essential hypertension   Normocytic anemia   History of CVA (cerebrovascular accident)   SLE (systemic lupus erythematosus related syndrome) (HCC)   Nausea & vomiting   Hypothyroidism   Anxiety and depression    Intractable nausea vomiting suspect likely from gastroparesis.  CT abdomen unremarkable.  Patient states Reglan  works best for her.  Will order Reglan  with Benadryl  and observe.  Advance diet as tolerated.  Protonix  IV. Diabetes mellitus type 2 takes metformin .  Last hemoglobin A1c was 7.3 a month ago.  Presently on sliding scale coverage every 4 until patient can reliably take orally. History of DVT on Eliquis  which patient has not taken it for last few days.  Will keep patient on Lovenox  until patient can take Eliquis . History of chronic HFpEF and hypertension on Entresto  spironolactone  SGLT2  inhibitors and spironolactone  and carvedilol .  Restart torsemide  if patient will be able to tolerate diet. History of SLE and Sjogren's syndrome on immunosuppressants which patient has not taken it for last few days due to vomiting. Chronic kidney disease stage III creatinine at around baseline. Chronic anemia likely from renal disease follow CBC. GERD on PPI. Abnormal L4 changes seen on the CAT scan will need further imaging which can be done as outpatient. History of buttock ulcers being followed by wound team.  Wound team notes seen yesterday says that the ulcers are healing well. Morbid obesity. Hypothyroidism on Synthroid . Chronic pain on Lyrica . Hyperlipidemia on statins.  Since patient has intractable nausea vomiting unable to keep anything we will need close monitoring further workup and more than 2 midnight stay.   DVT prophylaxis: Lovenox  full dose. Code Status: Full code. Family Communication: Suggested patient. Disposition Plan: Medical floor. Consults called: None. Admission status: The patient.

## 2024-03-26 NOTE — ED Notes (Signed)
Pahwani MD at bedside. 

## 2024-03-26 NOTE — Progress Notes (Signed)
 Patient transferred from ED. Alert and oriented. RA. Connected to cardiac monitor box 04. Call bell with in the reach.

## 2024-03-26 NOTE — Progress Notes (Signed)
 PHARMACY - ANTICOAGULATION CONSULT NOTE  Pharmacy Consult for Lovenox  Indication:  VTE treatment  Allergies  Allergen Reactions   Metoclopramide  Other (See Comments)    Developed restless leg, akathisia type limb movements.    Codeine Itching and Rash   Hydrocodone  Rash   Percocet [Oxycodone -Acetaminophen ] Hives and Rash    Tolerates dilaudid , Tolerates acetaminophen      Patient Measurements: Height: 5' 8 (172.7 cm) Weight: (!) 149.7 kg (330 lb) IBW/kg (Calculated) : 63.9 HEPARIN  DW (KG): 100.8  Vital Signs: Temp: 98.7 F (37.1 C) (08/26 0200) Temp Source: Oral (08/26 0200) BP: 157/79 (08/26 0200) Pulse Rate: 85 (08/26 0200)  Labs: Recent Labs    03/24/24 1404 03/25/24 1446  HGB 11.1* 11.2*  HCT 36.4 35.6*  PLT 248 229  CREATININE 1.06* 1.13*    Estimated Creatinine Clearance: 89.3 mL/min (A) (by C-G formula based on SCr of 1.13 mg/dL (H)).   Medical History: Past Medical History:  Diagnosis Date   Acute renal failure (HCC)     Intractable nausea vomiting secondary to diabetic gastroparesis causing dehydration and acute renal failure /notes 04/01/2013   Anginal pain (HCC)    Anxiety    Bell's palsy 08/09/2010   Bicornuate uterus    CAP (community acquired pneumonia) 10/2011   thelbert 11/08/2011   Chest pain 01/13/2014   CHF (congestive heart failure) (HCC)    Diabetic gastroparesis associated with type 2 diabetes mellitus (HCC)    this is presumed diagnoses, not confirmed by any studies.    Eczema    Family history of breast cancer    Family history of malignant neoplasm of breast    Family history of malignant neoplasm of ovary    Family history of ovarian cancer    Family history of prostate cancer    GERD (gastroesophageal reflux disease)    High cholesterol    Hypertension    Liver lesion 08/13/2019   Liver mass 2014   biopsied 03/2013 at [facility], not malignant.  is to undergo radiologic ablation of the mass in sept/October 2014.    Lupus     Migraines    maybe a couple times/yr (04/01/2013)   Obesity    Obstructive sleep apnea on CPAP 2011   Oxygen at nights   Pulmonary embolism (HCC) 05/21/2021   SJOGREN'S SYNDROME 08/09/2010   SLE (systemic lupus erythematosus) (HCC)    thelbert 12/01/2010   Assessment: Pharmacy consulted to dose Lovenox  for VTE treatment.  53 y.o female on Apixaban  2.5mg  bid PTA  - she reports vomited up dose last night.  H/o Pulmonary embolus 05/21/2021 I confirmed with Dr. Franky that patient does not have an acute VTE, but  patient was not able to take Eliquis  for last few days due to vomiting, thus MD wants treatment dosing.   Goal of Therapy:  Monitor platelets by anticoagulation protocol: Yes   Plan:  Lovenox  150mg  SQ every 12 hours =1mg /kg q12h. Monitor for s/sx of bleeding, renal function.  Follow up for restart of PTA Eliquis    Thank you for allowing pharmacy to be part of this patients care team.  Levorn Gaskins, RPh Clinical Pharmacist  Please check AMION for all [facility] Pharmacy phone numbers After 10:00 PM, call Main Pharmacy [phone]   Gaskins Levorn Requena 03/26/2024,3:27 AM

## 2024-03-27 DIAGNOSIS — R112 Nausea with vomiting, unspecified: Secondary | ICD-10-CM | POA: Diagnosis not present

## 2024-03-27 LAB — GLUCOSE, CAPILLARY
Glucose-Capillary: 109 mg/dL — ABNORMAL HIGH (ref 70–99)
Glucose-Capillary: 123 mg/dL — ABNORMAL HIGH (ref 70–99)
Glucose-Capillary: 126 mg/dL — ABNORMAL HIGH (ref 70–99)
Glucose-Capillary: 134 mg/dL — ABNORMAL HIGH (ref 70–99)
Glucose-Capillary: 148 mg/dL — ABNORMAL HIGH (ref 70–99)
Glucose-Capillary: 153 mg/dL — ABNORMAL HIGH (ref 70–99)
Glucose-Capillary: 174 mg/dL — ABNORMAL HIGH (ref 70–99)

## 2024-03-27 LAB — COMPREHENSIVE METABOLIC PANEL WITH GFR
ALT: 11 U/L (ref 0–44)
AST: 17 U/L (ref 15–41)
Albumin: 2.9 g/dL — ABNORMAL LOW (ref 3.5–5.0)
Alkaline Phosphatase: 61 U/L (ref 38–126)
Anion gap: 10 (ref 5–15)
BUN: 9 mg/dL (ref 6–20)
CO2: 21 mmol/L — ABNORMAL LOW (ref 22–32)
Calcium: 8.7 mg/dL — ABNORMAL LOW (ref 8.9–10.3)
Chloride: 110 mmol/L (ref 98–111)
Creatinine, Ser: 1.16 mg/dL — ABNORMAL HIGH (ref 0.44–1.00)
GFR, Estimated: 56 mL/min — ABNORMAL LOW (ref >60)
Glucose, Bld: 128 mg/dL — ABNORMAL HIGH (ref 70–99)
Potassium: 3.9 mmol/L (ref 3.5–5.1)
Sodium: 141 mmol/L (ref 135–145)
Total Bilirubin: 1.6 mg/dL — ABNORMAL HIGH (ref 0.0–1.2)
Total Protein: 5.7 g/dL — ABNORMAL LOW (ref 6.5–8.1)

## 2024-03-27 LAB — CBC WITH DIFFERENTIAL/PLATELET
Basophils Absolute: 0.1 K/uL (ref 0.0–0.1)
Basophils Relative: 1 %
Eosinophils Absolute: 0.5 K/uL (ref 0.0–0.5)
Eosinophils Relative: 5 %
HCT: 30.1 % — ABNORMAL LOW (ref 36.0–46.0)
Hemoglobin: 9.6 g/dL — ABNORMAL LOW (ref 12.0–15.0)
Lymphocytes Relative: 25 %
Lymphs Abs: 2.3 K/uL (ref 0.7–4.0)
MCH: 28.8 pg (ref 26.0–34.0)
MCHC: 31.9 g/dL (ref 30.0–36.0)
MCV: 90.4 fL (ref 80.0–100.0)
Monocytes Absolute: 0.4 K/uL (ref 0.1–1.0)
Monocytes Relative: 4 %
Neutro Abs: 5.9 K/uL (ref 1.7–7.7)
Neutrophils Relative %: 65 %
Platelets: 165 K/uL (ref 150–400)
RBC: 3.33 MIL/uL — ABNORMAL LOW (ref 3.87–5.11)
RDW: 14.8 % (ref 11.5–15.5)
WBC: 9.1 K/uL (ref 4.0–10.5)
nRBC: 0 % (ref 0.0–0.2)

## 2024-03-27 MED ORDER — ENSURE PLUS HIGH PROTEIN PO LIQD
237.0000 mL | Freq: Two times a day (BID) | ORAL | Status: DC
  Administered 2024-03-28 – 2024-03-31 (×6): 237 mL via ORAL

## 2024-03-27 MED ORDER — PROCHLORPERAZINE EDISYLATE 10 MG/2ML IJ SOLN
5.0000 mg | Freq: Once | INTRAMUSCULAR | Status: AC
  Administered 2024-03-27: 5 mg via INTRAVENOUS
  Filled 2024-03-27: qty 2

## 2024-03-27 MED ORDER — PROCHLORPERAZINE EDISYLATE 10 MG/2ML IJ SOLN
10.0000 mg | Freq: Four times a day (QID) | INTRAMUSCULAR | Status: DC | PRN
  Administered 2024-03-27 – 2024-04-02 (×17): 10 mg via INTRAVENOUS
  Filled 2024-03-27 (×17): qty 2

## 2024-03-27 NOTE — Plan of Care (Signed)

## 2024-03-27 NOTE — Progress Notes (Signed)
 PROGRESS NOTE    [name]  FMW:[id number] DOB: 07-23-1971 DOA: 03/25/2024 PCP: [name], MD   Brief Narrative:  This is a pleasant 53 year old lady with multiple medical problems mainly type 2 diabetes mellitus and presumed gastroparesis presented to ED with nausea and vomiting and poor p.o. intake for last 8 days.  Admitted yet again with diabetic gastroparesis.  Assessment & Plan:   Principal Problem:   Nausea and vomiting Active Problems:   Acute renal failure superimposed on stage 3a chronic kidney disease (HCC)   SJOGREN'S SYNDROME   Chronic combined systolic and diastolic CHF (congestive heart failure) (HCC)   Generalized abdominal pain   Reflux esophagitis   Diabetic gastroparesis (HCC)   Type 2 diabetes mellitus with hyperlipidemia (HCC)   Obesity hypoventilation syndrome (HCC)   Essential hypertension   Normocytic anemia   History of CVA (cerebrovascular accident)   SLE (systemic lupus erythematosus related syndrome) (HCC)   Nausea & vomiting   Hypothyroidism   Anxiety and depression  Intractable nausea and vomiting due to diabetic gastroparesis: CT abdomen unremarkable.  Still complains of nausea and vomiting.  Will continue Reglan  10 mg 3 times daily Premeal and will continue on full liquid diet and give her another 24 hours.  Type 2 diabetes mellitus: Takes metformin  at home.  Hemoglobin A1c 7.3 a month ago.  Continue SSI, blood sugar controlled.  History of DVT: On Eliquis  PTA but due to nausea and vomiting, currently on Lovenox .  History of chronic heart failure with preserved ejection fraction and hypertension: On Entresto , BiDil , Aldactone  and Coreg  at home.  Except diuretics, rest of the medications are continued and she is taking those medications.  History of SLE and Sjogren syndrome: On immunosuppressants which patient has not taken at for last few days due to vomiting.  Holding them for now.  Anemia of chronic disease: Hemoglobin  stable.  CKD stage IIIa: Stable.  GERD: Continue PPI.  Hyperlipidemia: Continue statin.  Chronic pain: Continue Lyrica .  Acquired hypothyroidism: Continue Synthroid .  Morbid obesity class II: BMI 50.  Weight loss and diet modification counseled.  DVT prophylaxis: Lovenox    Code Status: Full Code  Family Communication:  None present at bedside.  Plan of care discussed with patient in length and he/she verbalized understanding and agreed with it.  Status is: Inpatient Remains inpatient appropriate because: Still with nausea and vomiting.   Estimated body mass index is 50.18 kg/m as calculated from the following:   Height as of this encounter: 5' 8 (1.727 m).   Weight as of this encounter: 149.7 kg.    Nutritional Assessment: Body mass index is 50.18 kg/m.SABRA Seen by dietician.  I agree with the assessment and plan as outlined below: Nutrition Status:        . Skin Assessment: I have examined the patient's skin and I agree with the wound assessment as performed by the wound care RN as outlined below:    Consultants:  None  Procedures:  None  Antimicrobials:  Anti-infectives (From admission, onward)    None         Subjective: Patient seen and examined, she still complains of nausea and vomiting, not much improved compared to yesterday.  No abdominal pain though.  No other complaint.  Objective: Vitals:   03/26/24 2359 03/27/24 0459 03/27/24 0757 03/27/24 1134  BP: 122/71 129/75 (!) 141/73 (!) 100/54  Pulse: 92 87 87 91  Resp: 17 18    Temp: 98 F (36.7 C) 97.9 F (  36.6 C) 97.8 F (36.6 C)   TempSrc: Oral Oral    SpO2: 96% 99% 100% 98%  Weight:      Height:        Intake/Output Summary (Last 24 hours) at 03/27/2024 1144 Last data filed at 03/27/2024 9364 Gross per 24 hour  Intake 2399.13 ml  Output --  Net 2399.13 ml   Filed Weights   03/25/24 1441  Weight: (!) 149.7 kg    Examination:  General exam: Appears calm and comfortable  obese Respiratory system: Clear to auscultation. Respiratory effort normal. Cardiovascular system: S1 & S2 heard, RRR. No JVD, murmurs, rubs, gallops or clicks. No pedal edema. Gastrointestinal system: Abdomen is nondistended, soft and nontender. No organomegaly or masses felt. Normal bowel sounds heard. Central nervous system: Alert and oriented. No focal neurological deficits. Extremities: Symmetric 5 x 5 power. Skin: No rashes, lesions or ulcers Psychiatry: Judgement and insight appear normal. Mood & affect appropriate.    Data Reviewed: I have personally reviewed following labs and imaging studies  CBC: Recent Labs  Lab 03/22/24 0229 03/24/24 1404 03/25/24 1446 03/26/24 0323 03/27/24 0212  WBC 9.4 10.3 10.7* 10.2 9.1  NEUTROABS 6.5  --   --  6.0 5.9  HGB 10.3* 11.1* 11.2* 10.3* 9.6*  HCT 33.7* 36.4 35.6* 33.3* 30.1*  MCV 91.1 90.8 90.8 92.0 90.4  PLT 241 248 229 184 165   Basic Metabolic Panel: Recent Labs  Lab 03/22/24 0229 03/24/24 1404 03/25/24 1446 03/26/24 0323 03/27/24 0212  NA 139 143 143 141 141  K 3.0* 3.5 3.1* 3.8 3.9  CL 104 109 108 107 110  CO2 22 21* 19* 21* 21*  GLUCOSE 167* 125* 120* 102* 128*  BUN 12 11 12 11 9   CREATININE 1.08* 1.06* 1.13* 1.24* 1.16*  CALCIUM  9.0 8.9 8.5* 8.7* 8.7*  MG 2.0  --   --   --   --    GFR: Estimated Creatinine Clearance: 86.9 mL/min (A) (by C-G formula based on SCr of 1.16 mg/dL (H)). Liver Function Tests: Recent Labs  Lab 03/22/24 0229 03/24/24 1404 03/25/24 1446 03/26/24 0323 03/27/24 0212  AST 16 18 16 16 17   ALT 10 10 10 9 11   ALKPHOS 74 75 67 64 61  BILITOT 1.2 1.3* 1.4* 1.1 1.6*  PROT 7.2 6.9 6.4* 5.8* 5.7*  ALBUMIN  3.3* 3.3* 2.9* 2.8* 2.9*   Recent Labs  Lab 03/22/24 0229 03/24/24 1404 03/25/24 1446  LIPASE 31 24 26    No results for input(s): AMMONIA in the last 168 hours. Coagulation Profile: No results for input(s): INR, PROTIME in the last 168 hours. Cardiac Enzymes: No results  for input(s): CKTOTAL, CKMB, CKMBINDEX, TROPONINI in the last 168 hours. BNP (last 3 results) Recent Labs    07/20/23 1513  PROBNP 74   HbA1C: No results for input(s): HGBA1C in the last 72 hours. CBG: Recent Labs  Lab 03/26/24 2052 03/27/24 0026 03/27/24 0615 03/27/24 0758 03/27/24 1135  GLUCAP 123* 109* 126* 123* 148*   Lipid Profile: No results for input(s): CHOL, HDL, LDLCALC, TRIG, CHOLHDL, LDLDIRECT in the last 72 hours. Thyroid  Function Tests: Recent Labs    03/26/24 0323  TSH 1.595   Anemia Panel: No results for input(s): VITAMINB12, FOLATE, FERRITIN, TIBC, IRON, RETICCTPCT in the last 72 hours. Sepsis Labs: No results for input(s): PROCALCITON, LATICACIDVEN in the last 168 hours.  No results found for this or any previous visit (from the past 240 hours).   Radiology Studies: CT ABDOMEN PELVIS  W CONTRAST Result Date: 03/25/2024 CLINICAL DATA:  intractible vomiting. EXAM: CT ABDOMEN AND PELVIS WITH CONTRAST TECHNIQUE: Multidetector CT imaging of the abdomen and pelvis was performed using the standard protocol following bolus administration of intravenous contrast. RADIATION DOSE REDUCTION: This exam was performed according to the departmental dose-optimization program which includes automated exposure control, adjustment of the mA and/or kV according to patient size and/or use of iterative reconstruction technique. CONTRAST:  OMNIPAQUE  IOHEXOL  350 MG/ML SOLN COMPARISON:  CT scan abdomen and pelvis from 02/23/2024. FINDINGS: Lower chest: There are atelectatic changes at the left lung base. Bilateral visualized lungs are otherwise clear. No overt consolidation. No pleural effusion. The heart is normal in size. No pericardial effusion. Hepatobiliary: The liver is normal in size. Non-cirrhotic configuration. No suspicious mass. There is an ill-defined hypoattenuating approximately 7 x 9 mm area in the right hepatic lobe, which is too  small to adequately characterize. No intrahepatic or extrahepatic bile duct dilation. No calcified gallstones. Normal gallbladder wall thickness. No pericholecystic inflammatory changes. Pancreas: Unremarkable. No pancreatic ductal dilatation or surrounding inflammatory changes. Spleen: Within normal limits. No focal lesion. Adrenals/Urinary Tract: Adrenal glands are unremarkable. No suspicious renal mass. There is a 2 mm nonobstructing calculus in the right kidney upper pole. No other nephroureterolithiasis or obstructive uropathy on either side. Urinary bladder is under distended, precluding optimal assessment. However, no large mass or stones identified. No perivesical fat stranding. Stomach/Bowel: There is a small sliding hiatal hernia. No disproportionate dilation of the small or large bowel loops. No evidence of abnormal bowel wall thickening or inflammatory changes. The appendix is unremarkable. Vascular/Lymphatic: No ascites or pneumoperitoneum. No abdominal or pelvic lymphadenopathy, by size criteria. No aneurysmal dilation of the major abdominal arteries. Reproductive: The uterus is unremarkable. No large adnexal mass. Other: The visualized soft tissues and abdominal wall are unremarkable. Musculoskeletal: No suspicious osseous lesions. There are mild multilevel degenerative changes in the visualized spine. Since the prior study there is new subtle superior endplate sclerosis/deformity of the L4 vertebral body. However, no associated retropulsion or spinal canal compromise. IMPRESSION: 1. No acute inflammatory process identified within the abdomen or pelvis. No bowel obstruction. 2. There is new subtle superior endplate sclerosis/deformity of the L4 vertebral body. However, no associated retropulsion or spinal canal compromise. 3. Multiple other nonacute observations, as described above. Electronically Signed   By: Ree Molt M.D.   On: 03/25/2024 18:25    Scheduled Meds:  carvedilol   25 mg Oral  BID   enoxaparin  (LOVENOX ) injection  150 mg Subcutaneous Q12H   fluticasone  furoate-vilanterol  1 puff Inhalation Daily   insulin  aspart  0-9 Units Subcutaneous Q4H   isosorbide -hydrALAZINE   1 tablet Oral TID   levothyroxine   50 mcg Oral Q0600   metoCLOPramide  (REGLAN ) injection  10 mg Intravenous TID AC   pantoprazole  (PROTONIX ) IV  40 mg Intravenous Q24H   rosuvastatin   20 mg Oral Daily   sacubitril -valsartan   1 tablet Oral BID   spironolactone   12.5 mg Oral Daily   Continuous Infusions:   LOS: 1 day   Fredia Skeeter, MD [facility]  03/27/2024, 11:44 AM   *Please note that this is a verbal dictation therefore any spelling or grammatical errors are due to the Dragon Medical One system interpretation.  Please page via Amion and do not message via secure chat for urgent patient care matters. Secure chat can be used for non urgent patient care matters.  How to contact the [facility] Attending or Consulting provider 7A -  7P or covering provider during after hours 7P -7A, for this patient?  Check the care team in [facility] and look for a) attending/consulting [facility] provider listed and b) the [facility] team listed. Page or secure chat 7A-7P. Log into [email or web address] and use [place]'s universal password to access. If you do not have the password, please contact the hospital operator. Locate the [facility] provider you are looking for under [facility] and page to a number that you can be directly reached. If you still have difficulty reaching the provider, please page the [facility] (Director on Call) for the Hospitalists listed on amion for assistance.

## 2024-03-28 DIAGNOSIS — R112 Nausea with vomiting, unspecified: Secondary | ICD-10-CM | POA: Diagnosis not present

## 2024-03-28 LAB — CBC WITH DIFFERENTIAL/PLATELET
Abs Immature Granulocytes: 0.02 K/uL (ref 0.00–0.07)
Basophils Absolute: 0.1 K/uL (ref 0.0–0.1)
Basophils Relative: 1 %
Eosinophils Absolute: 0.5 K/uL (ref 0.0–0.5)
Eosinophils Relative: 6 %
HCT: 28.5 % — ABNORMAL LOW (ref 36.0–46.0)
Hemoglobin: 9 g/dL — ABNORMAL LOW (ref 12.0–15.0)
Immature Granulocytes: 0 %
Lymphocytes Relative: 29 %
Lymphs Abs: 2.3 K/uL (ref 0.7–4.0)
MCH: 28.6 pg (ref 26.0–34.0)
MCHC: 31.6 g/dL (ref 30.0–36.0)
MCV: 90.5 fL (ref 80.0–100.0)
Monocytes Absolute: 0.8 K/uL (ref 0.1–1.0)
Monocytes Relative: 10 %
Neutro Abs: 4.4 K/uL (ref 1.7–7.7)
Neutrophils Relative %: 54 %
Platelets: 156 K/uL (ref 150–400)
RBC: 3.15 MIL/uL — ABNORMAL LOW (ref 3.87–5.11)
RDW: 14.8 % (ref 11.5–15.5)
WBC: 8.1 K/uL (ref 4.0–10.5)
nRBC: 0 % (ref 0.0–0.2)

## 2024-03-28 LAB — GLUCOSE, CAPILLARY
Glucose-Capillary: 119 mg/dL — ABNORMAL HIGH (ref 70–99)
Glucose-Capillary: 124 mg/dL — ABNORMAL HIGH (ref 70–99)
Glucose-Capillary: 138 mg/dL — ABNORMAL HIGH (ref 70–99)
Glucose-Capillary: 160 mg/dL — ABNORMAL HIGH (ref 70–99)
Glucose-Capillary: 166 mg/dL — ABNORMAL HIGH (ref 70–99)
Glucose-Capillary: 167 mg/dL — ABNORMAL HIGH (ref 70–99)

## 2024-03-28 LAB — BASIC METABOLIC PANEL WITH GFR
Anion gap: 6 (ref 5–15)
BUN: 8 mg/dL (ref 6–20)
CO2: 24 mmol/L (ref 22–32)
Calcium: 8.5 mg/dL — ABNORMAL LOW (ref 8.9–10.3)
Chloride: 111 mmol/L (ref 98–111)
Creatinine, Ser: 1.19 mg/dL — ABNORMAL HIGH (ref 0.44–1.00)
GFR, Estimated: 55 mL/min — ABNORMAL LOW (ref >60)
Glucose, Bld: 136 mg/dL — ABNORMAL HIGH (ref 70–99)
Potassium: 3.3 mmol/L — ABNORMAL LOW (ref 3.5–5.1)
Sodium: 141 mmol/L (ref 135–145)

## 2024-03-28 MED ORDER — POTASSIUM CHLORIDE 10 MEQ/100ML IV SOLN
10.0000 meq | INTRAVENOUS | Status: AC
  Administered 2024-03-28 (×4): 10 meq via INTRAVENOUS
  Filled 2024-03-28 (×2): qty 100

## 2024-03-28 NOTE — Plan of Care (Signed)

## 2024-03-28 NOTE — Progress Notes (Addendum)
 PROGRESS NOTE    [name]  FMW:[id number] DOB: 02-Feb-1971 DOA: 03/25/2024 PCP: [name], MD   Brief Narrative:  This is a pleasant 53 year old lady with multiple medical problems mainly type 2 diabetes mellitus and presumed gastroparesis presented to ED with nausea and vomiting and poor p.o. intake for last 8 days.  Admitted yet again with diabetic gastroparesis.  Assessment & Plan:   Principal Problem:   Nausea and vomiting Active Problems:   Acute renal failure superimposed on stage 3a chronic kidney disease (HCC)   SJOGREN'S SYNDROME   Chronic combined systolic and diastolic CHF (congestive heart failure) (HCC)   Generalized abdominal pain   Reflux esophagitis   Diabetic gastroparesis (HCC)   Type 2 diabetes mellitus with hyperlipidemia (HCC)   Obesity hypoventilation syndrome (HCC)   Essential hypertension   Normocytic anemia   History of CVA (cerebrovascular accident)   SLE (systemic lupus erythematosus related syndrome) (HCC)   Nausea & vomiting   Hypothyroidism   Anxiety and depression  Intractable nausea and vomiting due to diabetic gastroparesis: CT abdomen unremarkable.  Still complains of nausea and vomiting however symptoms are slightly improved.  Will continue Reglan  10 mg 3 times daily Premeal and will continue on full liquid diet and give her another 24 hours.  Hypokalemia: Replenished.  Type 2 diabetes mellitus: Takes metformin  at home.  Hemoglobin A1c 7.3 a month ago.  Continue SSI, blood sugar controlled.  History of DVT: On Eliquis  PTA but due to nausea and vomiting, currently on Lovenox .  History of chronic heart failure with preserved ejection fraction and hypertension: On Entresto , BiDil , Aldactone  and Coreg  at home.  Except diuretics, rest of the medications are continued and she is taking those medications.  History of SLE and Sjogren syndrome: On immunosuppressants which patient has not taken at for last few days due to vomiting.   Holding them for now.  Anemia of chronic disease: Hemoglobin stable.  CKD stage IIIa: Stable.  GERD: Continue PPI.  Hyperlipidemia: Continue statin.  Chronic pain: Continue Lyrica .  Acquired hypothyroidism: Continue Synthroid .  Morbid obesity class II: BMI 50.  Weight loss and diet modification counseled.  DVT prophylaxis: Lovenox    Code Status: Full Code  Family Communication:  None present at bedside.  Plan of care discussed with patient in length and he/she verbalized understanding and agreed with it.  Status is: Inpatient Remains inpatient appropriate because: Still with nausea and vomiting.   Estimated body mass index is 50.18 kg/m as calculated from the following:   Height as of this encounter: 5' 8 (1.727 m).   Weight as of this encounter: 149.7 kg.    Nutritional Assessment: Body mass index is 50.18 kg/m.SABRA Seen by dietician.  I agree with the assessment and plan as outlined below: Nutrition Status:        . Skin Assessment: I have examined the patient's skin and I agree with the wound assessment as performed by the wound care RN as outlined below:    Consultants:  None  Procedures:  None  Antimicrobials:  Anti-infectives (From admission, onward)    None         Subjective: Patient seen and examined.  She says that she is still having nausea and vomiting although she is slightly better but not as expected and she does not think she is ready to advance to soft diet yet.  No abdominal pain.  Objective: Vitals:   03/27/24 2047 03/27/24 2337 03/28/24 0412 03/28/24 0734  BP: 123/69 111/70  114/69 (!) 110/59  Pulse: 83 84 85 78  Resp: 20 20 18 18   Temp: 98.1 F (36.7 C) 98.3 F (36.8 C) 98.2 F (36.8 C) 98 F (36.7 C)  TempSrc:   Oral   SpO2: 97% 99% 100% 98%  Weight:      Height:        Intake/Output Summary (Last 24 hours) at 03/28/2024 0749 Last data filed at 03/27/2024 2337 Gross per 24 hour  Intake 240 ml  Output --  Net 240 ml    Filed Weights   03/25/24 1441  Weight: (!) 149.7 kg    Examination:  General exam: Appears calm and comfortable obese Respiratory system: Clear to auscultation. Respiratory effort normal. Cardiovascular system: S1 & S2 heard, RRR. No JVD, murmurs, rubs, gallops or clicks. No pedal edema. Gastrointestinal system: Abdomen is nondistended, soft and nontender. No organomegaly or masses felt. Normal bowel sounds heard. Central nervous system: Alert and oriented. No focal neurological deficits. Extremities: Symmetric 5 x 5 power. Skin: No rashes, lesions or ulcers Psychiatry: Judgement and insight appear normal. Mood & affect appropriate.    Data Reviewed: I have personally reviewed following labs and imaging studies  CBC: Recent Labs  Lab 03/22/24 0229 03/24/24 1404 03/25/24 1446 03/26/24 0323 03/27/24 0212 03/28/24 0258  WBC 9.4 10.3 10.7* 10.2 9.1 8.1  NEUTROABS 6.5  --   --  6.0 5.9 4.4  HGB 10.3* 11.1* 11.2* 10.3* 9.6* 9.0*  HCT 33.7* 36.4 35.6* 33.3* 30.1* 28.5*  MCV 91.1 90.8 90.8 92.0 90.4 90.5  PLT 241 248 229 184 165 156   Basic Metabolic Panel: Recent Labs  Lab 03/22/24 0229 03/24/24 1404 03/25/24 1446 03/26/24 0323 03/27/24 0212 03/28/24 0258  NA 139 143 143 141 141 141  K 3.0* 3.5 3.1* 3.8 3.9 3.3*  CL 104 109 108 107 110 111  CO2 22 21* 19* 21* 21* 24  GLUCOSE 167* 125* 120* 102* 128* 136*  BUN 12 11 12 11 9 8   CREATININE 1.08* 1.06* 1.13* 1.24* 1.16* 1.19*  CALCIUM  9.0 8.9 8.5* 8.7* 8.7* 8.5*  MG 2.0  --   --   --   --   --    GFR: Estimated Creatinine Clearance: 84.8 mL/min (A) (by C-G formula based on SCr of 1.19 mg/dL (H)). Liver Function Tests: Recent Labs  Lab 03/22/24 0229 03/24/24 1404 03/25/24 1446 03/26/24 0323 03/27/24 0212  AST 16 18 16 16 17   ALT 10 10 10 9 11   ALKPHOS 74 75 67 64 61  BILITOT 1.2 1.3* 1.4* 1.1 1.6*  PROT 7.2 6.9 6.4* 5.8* 5.7*  ALBUMIN  3.3* 3.3* 2.9* 2.8* 2.9*   Recent Labs  Lab 03/22/24 0229  03/24/24 1404 03/25/24 1446  LIPASE 31 24 26    No results for input(s): AMMONIA in the last 168 hours. Coagulation Profile: No results for input(s): INR, PROTIME in the last 168 hours. Cardiac Enzymes: No results for input(s): CKTOTAL, CKMB, CKMBINDEX, TROPONINI in the last 168 hours. BNP (last 3 results) Recent Labs    07/20/23 1513  PROBNP 74   HbA1C: No results for input(s): HGBA1C in the last 72 hours. CBG: Recent Labs  Lab 03/27/24 1135 03/27/24 1608 03/27/24 2047 03/27/24 2342 03/28/24 0419  GLUCAP 148* 174* 153* 134* 124*   Lipid Profile: No results for input(s): CHOL, HDL, LDLCALC, TRIG, CHOLHDL, LDLDIRECT in the last 72 hours. Thyroid  Function Tests: Recent Labs    03/26/24 0323  TSH 1.595   Anemia Panel: No results for  input(s): VITAMINB12, FOLATE, FERRITIN, TIBC, IRON, RETICCTPCT in the last 72 hours. Sepsis Labs: No results for input(s): PROCALCITON, LATICACIDVEN in the last 168 hours.  No results found for this or any previous visit (from the past 240 hours).   Radiology Studies: No results found.   Scheduled Meds:  carvedilol   25 mg Oral BID   enoxaparin  (LOVENOX ) injection  150 mg Subcutaneous Q12H   feeding supplement  237 mL Oral BID BM   fluticasone  furoate-vilanterol  1 puff Inhalation Daily   insulin  aspart  0-9 Units Subcutaneous Q4H   isosorbide -hydrALAZINE   1 tablet Oral TID   levothyroxine   50 mcg Oral Q0600   metoCLOPramide  (REGLAN ) injection  10 mg Intravenous TID AC   pantoprazole  (PROTONIX ) IV  40 mg Intravenous Q24H   rosuvastatin   20 mg Oral Daily   sacubitril -valsartan   1 tablet Oral BID   spironolactone   12.5 mg Oral Daily   Continuous Infusions:   LOS: 2 days   Fredia Skeeter, MD [facility]  03/28/2024, 7:49 AM   *Please note that this is a verbal dictation therefore any spelling or grammatical errors are due to the Dragon Medical One system  interpretation.  Please page via Amion and do not message via secure chat for urgent patient care matters. Secure chat can be used for non urgent patient care matters.  How to contact the [facility] Attending or Consulting provider 7A - 7P or covering provider during after hours 7P -7A, for this patient?  Check the care team in [facility] and look for a) attending/consulting [facility] provider listed and b) the [facility] team listed. Page or secure chat 7A-7P. Log into [email or web address] and use [place]'s universal password to access. If you do not have the password, please contact the hospital operator. Locate the [facility] provider you are looking for under [facility] and page to a number that you can be directly reached. If you still have difficulty reaching the provider, please page the [facility] (Director on Call) for the Hospitalists listed on amion for assistance.

## 2024-03-28 NOTE — Plan of Care (Signed)
  Problem: Coping: Goal: Ability to adjust to condition or change in health will improve Outcome: Not Progressing   Problem: Health Behavior/Discharge Planning: Goal: Ability to manage health-related needs will improve Outcome: Not Progressing   Problem: Skin Integrity: Goal: Risk for impaired skin integrity will decrease Outcome: Not Progressing   Problem: Health Behavior/Discharge Planning: Goal: Ability to manage health-related needs will improve Outcome: Not Progressing   Problem: Activity: Goal: Risk for activity intolerance will decrease Outcome: Not Progressing

## 2024-03-29 DIAGNOSIS — R112 Nausea with vomiting, unspecified: Secondary | ICD-10-CM | POA: Diagnosis not present

## 2024-03-29 LAB — BASIC METABOLIC PANEL WITH GFR
Anion gap: 10 (ref 5–15)
BUN: 6 mg/dL (ref 6–20)
CO2: 20 mmol/L — ABNORMAL LOW (ref 22–32)
Calcium: 8.2 mg/dL — ABNORMAL LOW (ref 8.9–10.3)
Chloride: 106 mmol/L (ref 98–111)
Creatinine, Ser: 1.27 mg/dL — ABNORMAL HIGH (ref 0.44–1.00)
GFR, Estimated: 51 mL/min — ABNORMAL LOW (ref >60)
Glucose, Bld: 133 mg/dL — ABNORMAL HIGH (ref 70–99)
Potassium: 3.4 mmol/L — ABNORMAL LOW (ref 3.5–5.1)
Sodium: 136 mmol/L (ref 135–145)

## 2024-03-29 LAB — GLUCOSE, CAPILLARY
Glucose-Capillary: 106 mg/dL — ABNORMAL HIGH (ref 70–99)
Glucose-Capillary: 114 mg/dL — ABNORMAL HIGH (ref 70–99)
Glucose-Capillary: 118 mg/dL — ABNORMAL HIGH (ref 70–99)
Glucose-Capillary: 126 mg/dL — ABNORMAL HIGH (ref 70–99)
Glucose-Capillary: 145 mg/dL — ABNORMAL HIGH (ref 70–99)
Glucose-Capillary: 175 mg/dL — ABNORMAL HIGH (ref 70–99)

## 2024-03-29 MED ORDER — POTASSIUM CHLORIDE 10 MEQ/100ML IV SOLN
10.0000 meq | INTRAVENOUS | Status: AC
  Administered 2024-03-29 (×4): 10 meq via INTRAVENOUS
  Filled 2024-03-29 (×4): qty 100

## 2024-03-29 NOTE — Plan of Care (Signed)
  Problem: Education: Goal: Ability to describe self-care measures that may prevent or decrease complications (Diabetes Survival Skills Education) will improve Outcome: Progressing Goal: Individualized Educational Video(s) Outcome: Progressing   Problem: Health Behavior/Discharge Planning: Goal: Ability to identify and utilize available resources and services will improve Outcome: Progressing Goal: Ability to manage health-related needs will improve Outcome: Progressing   Problem: Metabolic: Goal: Ability to maintain appropriate glucose levels will improve Outcome: Progressing   Problem: Nutritional: Goal: Maintenance of adequate nutrition will improve Outcome: Progressing Goal: Progress toward achieving an optimal weight will improve Outcome: Progressing   Problem: Skin Integrity: Goal: Risk for impaired skin integrity will decrease Outcome: Progressing   Problem: Tissue Perfusion: Goal: Adequacy of tissue perfusion will improve Outcome: Progressing   Problem: Education: Goal: Knowledge of General Education information will improve Description: Including pain rating scale, medication(s)/side effects and non-pharmacologic comfort measures Outcome: Progressing   Problem: Health Behavior/Discharge Planning: Goal: Ability to manage health-related needs will improve Outcome: Progressing   Problem: Clinical Measurements: Goal: Ability to maintain clinical measurements within normal limits will improve Outcome: Progressing Goal: Will remain free from infection Outcome: Progressing Goal: Diagnostic test results will improve Outcome: Progressing Goal: Respiratory complications will improve Outcome: Progressing Goal: Cardiovascular complication will be avoided Outcome: Progressing   Problem: Activity: Goal: Risk for activity intolerance will decrease Outcome: Progressing   Problem: Nutrition: Goal: Adequate nutrition will be maintained Outcome: Progressing   Problem:  Coping: Goal: Level of anxiety will decrease Outcome: Progressing   Problem: Elimination: Goal: Will not experience complications related to bowel motility Outcome: Progressing Goal: Will not experience complications related to urinary retention Outcome: Progressing   Problem: Pain Managment: Goal: General experience of comfort will improve and/or be controlled Outcome: Progressing   Problem: Safety: Goal: Ability to remain free from injury will improve Outcome: Progressing   Problem: Skin Integrity: Goal: Risk for impaired skin integrity will decrease Outcome: Progressing   Problem: Coping: Goal: Ability to adjust to condition or change in health will improve Outcome: Not Progressing   Problem: Fluid Volume: Goal: Ability to maintain a balanced intake and output will improve Outcome: Not Progressing

## 2024-03-29 NOTE — Plan of Care (Signed)

## 2024-03-29 NOTE — Progress Notes (Signed)
 PROGRESS NOTE    [name]  FMW:[id number] DOB: 02/26/1971 DOA: 03/25/2024 PCP: [name], MD   Brief Narrative:  This is a pleasant 53 year old lady with multiple medical problems mainly type 2 diabetes mellitus and presumed gastroparesis presented to ED with nausea and vomiting and poor p.o. intake for last 8 days.  Admitted yet again with diabetic gastroparesis.  Assessment & Plan:   Principal Problem:   Nausea and vomiting Active Problems:   Acute renal failure superimposed on stage 3a chronic kidney disease (HCC)   SJOGREN'S SYNDROME   Chronic combined systolic and diastolic CHF (congestive heart failure) (HCC)   Generalized abdominal pain   Reflux esophagitis   Diabetic gastroparesis (HCC)   Type 2 diabetes mellitus with hyperlipidemia (HCC)   Obesity hypoventilation syndrome (HCC)   Essential hypertension   Normocytic anemia   History of CVA (cerebrovascular accident)   SLE (systemic lupus erythematosus related syndrome) (HCC)   Nausea & vomiting   Hypothyroidism   Anxiety and depression  Intractable nausea and vomiting due to diabetic gastroparesis: CT abdomen unremarkable.  Still with vomiting but frequency is improving.  Still not ready to advance diet.  Will continue for liquid diet, continue scheduled Reglan  as well as as needed Compazine .    Hypokalemia: Low again, will replenish.  Type 2 diabetes mellitus: Takes metformin  at home.  Hemoglobin A1c 7.3 a month ago.  Continue SSI, blood sugar controlled.  History of DVT: On Eliquis  PTA but due to nausea and vomiting, currently on Lovenox .  History of chronic heart failure with preserved ejection fraction and hypertension: On Entresto , BiDil , Aldactone  and Coreg  at home.  Except diuretics, rest of the medications are continued and she is taking those medications.  History of SLE and Sjogren syndrome: On immunosuppressants which patient has not taken at for last few days due to vomiting.  Holding them  for now.  Anemia of chronic disease: Hemoglobin stable.  CKD stage IIIa: Stable.  GERD: Continue PPI.  Hyperlipidemia: Continue statin.  Chronic pain: Continue Lyrica .  Acquired hypothyroidism: Continue Synthroid .  Morbid obesity class II: BMI 50.  Weight loss and diet modification counseled.  DVT prophylaxis: Lovenox    Code Status: Full Code  Family Communication:  None present at bedside.  Plan of care discussed with patient in length and he/she verbalized understanding and agreed with it.  Status is: Inpatient Remains inpatient appropriate because: Still with nausea and vomiting.   Estimated body mass index is 50.18 kg/m as calculated from the following:   Height as of this encounter: 5' 8 (1.727 m).   Weight as of this encounter: 149.7 kg.    Nutritional Assessment: Body mass index is 50.18 kg/m.SABRA Seen by dietician.  I agree with the assessment and plan as outlined below: Nutrition Status:        . Skin Assessment: I have examined the patient's skin and I agree with the wound assessment as performed by the wound care RN as outlined below:    Consultants:  None  Procedures:  None  Antimicrobials:  Anti-infectives (From admission, onward)    None         Subjective: Seen and examined.  Improving but very slowly.  She is still not ready to advance diet.  She states that she thinks she will be ready to advance to soft diet tomorrow.  Objective: Vitals:   03/28/24 2012 03/28/24 2135 03/28/24 2135 03/29/24 0405  BP: (!) 112/58 118/73 118/73 99/85  Pulse: 87 85 85 78  Resp:    20  Temp: 99 F (37.2 C)   98.2 F (36.8 C)  TempSrc: Oral   Oral  SpO2: 100%   96%  Weight:      Height:       No intake or output data in the 24 hours ending 03/29/24 0739  Filed Weights   03/25/24 1441  Weight: (!) 149.7 kg    Examination: General exam: Appears calm and comfortable  Respiratory system: Clear to auscultation. Respiratory effort  normal. Cardiovascular system: S1 & S2 heard, RRR. No JVD, murmurs, rubs, gallops or clicks. No pedal edema. Gastrointestinal system: Abdomen is nondistended, soft and nontender. No organomegaly or masses felt. Normal bowel sounds heard. Central nervous system: Alert and oriented. No focal neurological deficits. Extremities: Symmetric 5 x 5 power. Skin: No rashes, lesions or ulcers.  Psychiatry: Judgement and insight appear normal. Mood & affect appropriate.   Data Reviewed: I have personally reviewed following labs and imaging studies  CBC: Recent Labs  Lab 03/24/24 1404 03/25/24 1446 03/26/24 0323 03/27/24 0212 03/28/24 0258  WBC 10.3 10.7* 10.2 9.1 8.1  NEUTROABS  --   --  6.0 5.9 4.4  HGB 11.1* 11.2* 10.3* 9.6* 9.0*  HCT 36.4 35.6* 33.3* 30.1* 28.5*  MCV 90.8 90.8 92.0 90.4 90.5  PLT 248 229 184 165 156   Basic Metabolic Panel: Recent Labs  Lab 03/25/24 1446 03/26/24 0323 03/27/24 0212 03/28/24 0258 03/29/24 0218  NA 143 141 141 141 136  K 3.1* 3.8 3.9 3.3* 3.4*  CL 108 107 110 111 106  CO2 19* 21* 21* 24 20*  GLUCOSE 120* 102* 128* 136* 133*  BUN 12 11 9 8 6   CREATININE 1.13* 1.24* 1.16* 1.19* 1.27*  CALCIUM  8.5* 8.7* 8.7* 8.5* 8.2*   GFR: Estimated Creatinine Clearance: 79.4 mL/min (A) (by C-G formula based on SCr of 1.27 mg/dL (H)). Liver Function Tests: Recent Labs  Lab 03/24/24 1404 03/25/24 1446 03/26/24 0323 03/27/24 0212  AST 18 16 16 17   ALT 10 10 9 11   ALKPHOS 75 67 64 61  BILITOT 1.3* 1.4* 1.1 1.6*  PROT 6.9 6.4* 5.8* 5.7*  ALBUMIN  3.3* 2.9* 2.8* 2.9*   Recent Labs  Lab 03/24/24 1404 03/25/24 1446  LIPASE 24 26   No results for input(s): AMMONIA in the last 168 hours. Coagulation Profile: No results for input(s): INR, PROTIME in the last 168 hours. Cardiac Enzymes: No results for input(s): CKTOTAL, CKMB, CKMBINDEX, TROPONINI in the last 168 hours. BNP (last 3 results) Recent Labs    07/20/23 1513  PROBNP 74    HbA1C: No results for input(s): HGBA1C in the last 72 hours. CBG: Recent Labs  Lab 03/28/24 1220 03/28/24 1612 03/28/24 2007 03/28/24 2341 03/29/24 0405  GLUCAP 138* 167* 119* 166* 114*   Lipid Profile: No results for input(s): CHOL, HDL, LDLCALC, TRIG, CHOLHDL, LDLDIRECT in the last 72 hours. Thyroid  Function Tests: No results for input(s): TSH, T4TOTAL, FREET4, T3FREE, THYROIDAB in the last 72 hours.  Anemia Panel: No results for input(s): VITAMINB12, FOLATE, FERRITIN, TIBC, IRON, RETICCTPCT in the last 72 hours. Sepsis Labs: No results for input(s): PROCALCITON, LATICACIDVEN in the last 168 hours.  No results found for this or any previous visit (from the past 240 hours).   Radiology Studies: No results found.   Scheduled Meds:  carvedilol   25 mg Oral BID   enoxaparin  (LOVENOX ) injection  150 mg Subcutaneous Q12H   feeding supplement  237 mL Oral BID BM  fluticasone  furoate-vilanterol  1 puff Inhalation Daily   insulin  aspart  0-9 Units Subcutaneous Q4H   isosorbide -hydrALAZINE   1 tablet Oral TID   levothyroxine   50 mcg Oral Q0600   metoCLOPramide  (REGLAN ) injection  10 mg Intravenous TID AC   pantoprazole  (PROTONIX ) IV  40 mg Intravenous Q24H   rosuvastatin   20 mg Oral Daily   sacubitril -valsartan   1 tablet Oral BID   spironolactone   12.5 mg Oral Daily   Continuous Infusions:  potassium chloride        LOS: 3 days   Fredia Skeeter, MD [facility]  03/29/2024, 7:39 AM   *Please note that this is a verbal dictation therefore any spelling or grammatical errors are due to the Dragon Medical One system interpretation.  Please page via Amion and do not message via secure chat for urgent patient care matters. Secure chat can be used for non urgent patient care matters.  How to contact the [facility] Attending or Consulting provider 7A - 7P or covering provider during after hours 7P -7A, for this patient?  Check the care  team in [facility] and look for a) attending/consulting [facility] provider listed and b) the [facility] team listed. Page or secure chat 7A-7P. Log into [email or web address] and use [place]'s universal password to access. If you do not have the password, please contact the hospital operator. Locate the [facility] provider you are looking for under [facility] and page to a number that you can be directly reached. If you still have difficulty reaching the provider, please page the [facility] (Director on Call) for the Hospitalists listed on amion for assistance.

## 2024-03-30 DIAGNOSIS — R112 Nausea with vomiting, unspecified: Secondary | ICD-10-CM | POA: Diagnosis not present

## 2024-03-30 LAB — GLUCOSE, CAPILLARY
Glucose-Capillary: 118 mg/dL — ABNORMAL HIGH (ref 70–99)
Glucose-Capillary: 115 mg/dL — ABNORMAL HIGH (ref 70–99)
Glucose-Capillary: 173 mg/dL — ABNORMAL HIGH (ref 70–99)
Glucose-Capillary: 188 mg/dL — ABNORMAL HIGH (ref 70–99)
Glucose-Capillary: 115 mg/dL — ABNORMAL HIGH (ref 70–99)

## 2024-03-30 LAB — CBC WITH DIFFERENTIAL/PLATELET
Abs Immature Granulocytes: 0.03 K/uL (ref 0.00–0.07)
Basophils Absolute: 0 K/uL (ref 0.0–0.1)
Basophils Relative: 1 %
Eosinophils Absolute: 0.6 K/uL — ABNORMAL HIGH (ref 0.0–0.5)
Eosinophils Relative: 8 %
HCT: 31.4 % — ABNORMAL LOW (ref 36.0–46.0)
Hemoglobin: 9.9 g/dL — ABNORMAL LOW (ref 12.0–15.0)
Immature Granulocytes: 0 %
Lymphocytes Relative: 25 %
Lymphs Abs: 1.9 K/uL (ref 0.7–4.0)
MCH: 28.4 pg (ref 26.0–34.0)
MCHC: 31.5 g/dL (ref 30.0–36.0)
MCV: 90.2 fL (ref 80.0–100.0)
Monocytes Absolute: 0.8 K/uL (ref 0.1–1.0)
Monocytes Relative: 11 %
Neutro Abs: 4.3 K/uL (ref 1.7–7.7)
Neutrophils Relative %: 55 %
Platelets: 158 K/uL (ref 150–400)
RBC: 3.48 MIL/uL — ABNORMAL LOW (ref 3.87–5.11)
RDW: 14.6 % (ref 11.5–15.5)
WBC: 7.7 K/uL (ref 4.0–10.5)
nRBC: 0 % (ref 0.0–0.2)

## 2024-03-30 LAB — BASIC METABOLIC PANEL WITH GFR
Anion gap: 8 (ref 5–15)
BUN: 8 mg/dL (ref 6–20)
CO2: 22 mmol/L (ref 22–32)
Calcium: 8.7 mg/dL — ABNORMAL LOW (ref 8.9–10.3)
Chloride: 106 mmol/L (ref 98–111)
Creatinine, Ser: 1.4 mg/dL — ABNORMAL HIGH (ref 0.44–1.00)
GFR, Estimated: 45 mL/min — ABNORMAL LOW (ref >60)
Glucose, Bld: 145 mg/dL — ABNORMAL HIGH (ref 70–99)
Potassium: 3.6 mmol/L (ref 3.5–5.1)
Sodium: 136 mmol/L (ref 135–145)

## 2024-03-30 LAB — MAGNESIUM: Magnesium: 1.6 mg/dL — ABNORMAL LOW (ref 1.7–2.4)

## 2024-03-30 MED ORDER — MORPHINE SULFATE (PF) 2 MG/ML IV SOLN
1.0000 mg | Freq: Three times a day (TID) | INTRAVENOUS | Status: DC | PRN
  Administered 2024-03-30 – 2024-04-01 (×6): 1 mg via INTRAVENOUS
  Filled 2024-03-30 (×6): qty 1

## 2024-03-30 MED ORDER — SODIUM CHLORIDE 0.9 % IV SOLN
12.5000 mg | Freq: Once | INTRAVENOUS | Status: DC
  Filled 2024-03-30: qty 0.5

## 2024-03-30 MED ORDER — PANTOPRAZOLE SODIUM 40 MG PO TBEC
40.0000 mg | DELAYED_RELEASE_TABLET | Freq: Every day | ORAL | Status: DC
  Administered 2024-03-31 – 2024-04-04 (×5): 40 mg via ORAL
  Filled 2024-03-30 (×5): qty 1

## 2024-03-30 NOTE — Plan of Care (Signed)
  Problem: Education: Goal: Ability to describe self-care measures that may prevent or decrease complications (Diabetes Survival Skills Education) will improve Outcome: Progressing   Problem: Health Behavior/Discharge Planning: Goal: Ability to identify and utilize available resources and services will improve Outcome: Progressing   Problem: Skin Integrity: Goal: Risk for impaired skin integrity will decrease Outcome: Progressing   Problem: Tissue Perfusion: Goal: Adequacy of tissue perfusion will improve Outcome: Progressing   Problem: Education: Goal: Knowledge of General Education information will improve Description: Including pain rating scale, medication(s)/side effects and non-pharmacologic comfort measures Outcome: Progressing   Problem: Clinical Measurements: Goal: Cardiovascular complication will be avoided Outcome: Progressing   Problem: Activity: Goal: Risk for activity intolerance will decrease Outcome: Progressing   Problem: Coping: Goal: Level of anxiety will decrease Outcome: Progressing   Problem: Elimination: Goal: Will not experience complications related to urinary retention Outcome: Progressing   Problem: Pain Managment: Goal: General experience of comfort will improve and/or be controlled Outcome: Progressing   Problem: Safety: Goal: Ability to remain free from injury will improve Outcome: Progressing

## 2024-03-30 NOTE — Progress Notes (Signed)
 PROGRESS NOTE    [name]  FMW:[id number] DOB: 1970-10-10 DOA: 03/25/2024 PCP: [name], MD   Brief Narrative:  This is a pleasant 53 year old lady with multiple medical problems mainly type 2 diabetes mellitus and presumed gastroparesis presented to ED with nausea and vomiting and poor p.o. intake for last 8 days.  Admitted yet again with diabetic gastroparesis.  Assessment & Plan:   Principal Problem:   Nausea and vomiting Active Problems:   Acute renal failure superimposed on stage 3a chronic kidney disease (HCC)   SJOGREN'S SYNDROME   Chronic combined systolic and diastolic CHF (congestive heart failure) (HCC)   Generalized abdominal pain   Reflux esophagitis   Diabetic gastroparesis (HCC)   Type 2 diabetes mellitus with hyperlipidemia (HCC)   Obesity hypoventilation syndrome (HCC)   Essential hypertension   Normocytic anemia   History of CVA (cerebrovascular accident)   SLE (systemic lupus erythematosus related syndrome) (HCC)   Nausea & vomiting   Hypothyroidism   Anxiety and depression  Intractable nausea and vomiting due to diabetic gastroparesis: CT abdomen unremarkable.  Continues to improve but gradually.  She is now willing to try soft diet so I will advance the diet.  Continue scheduled Reglan  and as needed Compazine .  Hypokalemia: Pending BMP today.  Potassium was replenished yesterday.  Checking magnesium  today.  Type 2 diabetes mellitus: Takes metformin  at home.  Hemoglobin A1c 7.3 a month ago.  Continue SSI, blood sugar controlled.  History of DVT: On Eliquis  PTA but due to nausea and vomiting, currently on Lovenox .  History of chronic heart failure with preserved ejection fraction and hypertension: On Entresto , BiDil , Aldactone  and Coreg  at home.  Except diuretics, rest of the medications are continued and she is taking those medications.  History of SLE and Sjogren syndrome: On immunosuppressants which patient has not taken at for last few  days due to vomiting.  Holding them for now.  Anemia of chronic disease: Hemoglobin stable.  CKD stage IIIa: Stable.  GERD: Continue PPI.  Hyperlipidemia: Continue statin.  Chronic pain: Continue Lyrica .  Acquired hypothyroidism: Continue Synthroid .  Morbid obesity class II: BMI 50.  Weight loss and diet modification counseled.  DVT prophylaxis: Lovenox    Code Status: Full Code  Family Communication:  None present at bedside.  Plan of care discussed with patient in length and he/she verbalized understanding and agreed with it.  Status is: Inpatient Remains inpatient appropriate because: Still with nausea and vomiting.   Estimated body mass index is 50.18 kg/m as calculated from the following:   Height as of this encounter: 5' 8 (1.727 m).   Weight as of this encounter: 149.7 kg.    Nutritional Assessment: Body mass index is 50.18 kg/m.SABRA Seen by dietician.  I agree with the assessment and plan as outlined below: Nutrition Status:        . Skin Assessment: I have examined the patient's skin and I agree with the wound assessment as performed by the wound care RN as outlined below:    Consultants:  None  Procedures:  None  Antimicrobials:  Anti-infectives (From admission, onward)    None         Subjective: Seen and examined.  She is feeling better.  Her last vomiting was yesterday.  She is willing to try soft diet today.  No abdominal pain.  I have reduced her morphine  frequency from every 4 hours to every 8 hours as needed.  Objective: Vitals:   03/29/24 2349 03/30/24 0100 03/30/24  0408 03/30/24 0740  BP: 93/60 (!) 96/56 (!) 94/59 (!) 97/56  Pulse:  86 83 86  Resp:  16 18 18   Temp:   98.8 F (37.1 C) 98.6 F (37 C)  TempSrc:      SpO2:   99% 99%  Weight:      Height:        Intake/Output Summary (Last 24 hours) at 03/30/2024 0753 Last data filed at 03/29/2024 1700 Gross per 24 hour  Intake 600 ml  Output --  Net 600 ml    Filed Weights    03/25/24 1441  Weight: (!) 149.7 kg    Examination: General exam: Appears calm and comfortable  Respiratory system: Clear to auscultation. Respiratory effort normal. Cardiovascular system: S1 & S2 heard, RRR. No JVD, murmurs, rubs, gallops or clicks. No pedal edema. Gastrointestinal system: Abdomen is nondistended, soft and nontender. No organomegaly or masses felt. Normal bowel sounds heard. Central nervous system: Alert and oriented. No focal neurological deficits. Extremities: Symmetric 5 x 5 power. Skin: No rashes, lesions or ulcers.  Psychiatry: Judgement and insight appear normal. Mood & affect appropriate.   Data Reviewed: I have personally reviewed following labs and imaging studies  CBC: Recent Labs  Lab 03/24/24 1404 03/25/24 1446 03/26/24 0323 03/27/24 0212 03/28/24 0258  WBC 10.3 10.7* 10.2 9.1 8.1  NEUTROABS  --   --  6.0 5.9 4.4  HGB 11.1* 11.2* 10.3* 9.6* 9.0*  HCT 36.4 35.6* 33.3* 30.1* 28.5*  MCV 90.8 90.8 92.0 90.4 90.5  PLT 248 229 184 165 156   Basic Metabolic Panel: Recent Labs  Lab 03/25/24 1446 03/26/24 0323 03/27/24 0212 03/28/24 0258 03/29/24 0218  NA 143 141 141 141 136  K 3.1* 3.8 3.9 3.3* 3.4*  CL 108 107 110 111 106  CO2 19* 21* 21* 24 20*  GLUCOSE 120* 102* 128* 136* 133*  BUN 12 11 9 8 6   CREATININE 1.13* 1.24* 1.16* 1.19* 1.27*  CALCIUM  8.5* 8.7* 8.7* 8.5* 8.2*   GFR: Estimated Creatinine Clearance: 79.4 mL/min (A) (by C-G formula based on SCr of 1.27 mg/dL (H)). Liver Function Tests: Recent Labs  Lab 03/24/24 1404 03/25/24 1446 03/26/24 0323 03/27/24 0212  AST 18 16 16 17   ALT 10 10 9 11   ALKPHOS 75 67 64 61  BILITOT 1.3* 1.4* 1.1 1.6*  PROT 6.9 6.4* 5.8* 5.7*  ALBUMIN  3.3* 2.9* 2.8* 2.9*   Recent Labs  Lab 03/24/24 1404 03/25/24 1446  LIPASE 24 26   No results for input(s): AMMONIA in the last 168 hours. Coagulation Profile: No results for input(s): INR, PROTIME in the last 168 hours. Cardiac  Enzymes: No results for input(s): CKTOTAL, CKMB, CKMBINDEX, TROPONINI in the last 168 hours. BNP (last 3 results) Recent Labs    07/20/23 1513  PROBNP 74   HbA1C: No results for input(s): HGBA1C in the last 72 hours. CBG: Recent Labs  Lab 03/29/24 1245 03/29/24 1719 03/29/24 1950 03/29/24 2336 03/30/24 0407  GLUCAP 126* 145* 175* 106* 118*   Lipid Profile: No results for input(s): CHOL, HDL, LDLCALC, TRIG, CHOLHDL, LDLDIRECT in the last 72 hours. Thyroid  Function Tests: No results for input(s): TSH, T4TOTAL, FREET4, T3FREE, THYROIDAB in the last 72 hours.  Anemia Panel: No results for input(s): VITAMINB12, FOLATE, FERRITIN, TIBC, IRON, RETICCTPCT in the last 72 hours. Sepsis Labs: No results for input(s): PROCALCITON, LATICACIDVEN in the last 168 hours.  No results found for this or any previous visit (from the past 240 hours).  Radiology Studies: No results found.   Scheduled Meds:  carvedilol   25 mg Oral BID   enoxaparin  (LOVENOX ) injection  150 mg Subcutaneous Q12H   feeding supplement  237 mL Oral BID BM   fluticasone  furoate-vilanterol  1 puff Inhalation Daily   insulin  aspart  0-9 Units Subcutaneous Q4H   isosorbide -hydrALAZINE   1 tablet Oral TID   levothyroxine   50 mcg Oral Q0600   metoCLOPramide  (REGLAN ) injection  10 mg Intravenous TID AC   pantoprazole  (PROTONIX ) IV  40 mg Intravenous Q24H   rosuvastatin   20 mg Oral Daily   sacubitril -valsartan   1 tablet Oral BID   spironolactone   12.5 mg Oral Daily   Continuous Infusions:     LOS: 4 days   Fredia Skeeter, MD [facility]  03/30/2024, 7:53 AM   *Please note that this is a verbal dictation therefore any spelling or grammatical errors are due to the Dragon Medical One system interpretation.  Please page via Amion and do not message via secure chat for urgent patient care matters. Secure chat can be used for non urgent patient care  matters.  How to contact the [facility] Attending or Consulting provider 7A - 7P or covering provider during after hours 7P -7A, for this patient?  Check the care team in [facility] and look for a) attending/consulting [facility] provider listed and b) the [facility] team listed. Page or secure chat 7A-7P. Log into [email or web address] and use [place]'s universal password to access. If you do not have the password, please contact the hospital operator. Locate the [facility] provider you are looking for under [facility] and page to a number that you can be directly reached. If you still have difficulty reaching the provider, please page the [facility] (Director on Call) for the Hospitalists listed on amion for assistance.

## 2024-03-31 DIAGNOSIS — R112 Nausea with vomiting, unspecified: Secondary | ICD-10-CM | POA: Diagnosis not present

## 2024-03-31 LAB — GLUCOSE, CAPILLARY
Glucose-Capillary: 101 mg/dL — ABNORMAL HIGH (ref 70–99)
Glucose-Capillary: 108 mg/dL — ABNORMAL HIGH (ref 70–99)
Glucose-Capillary: 116 mg/dL — ABNORMAL HIGH (ref 70–99)
Glucose-Capillary: 132 mg/dL — ABNORMAL HIGH (ref 70–99)
Glucose-Capillary: 135 mg/dL — ABNORMAL HIGH (ref 70–99)
Glucose-Capillary: 143 mg/dL — ABNORMAL HIGH (ref 70–99)

## 2024-03-31 NOTE — Plan of Care (Signed)

## 2024-03-31 NOTE — Progress Notes (Signed)
 PROGRESS NOTE    [name]  FMW:[id number] DOB: [birth year] DOA: 03/25/2024 PCP: [name], MD   Brief Narrative:  This is a pleasant 53 year old lady with multiple medical problems mainly type 2 diabetes mellitus and presumed gastroparesis presented to ED with nausea and vomiting and poor p.o. intake for last 8 days.  Admitted yet again with diabetic gastroparesis.  Assessment & Plan:   Principal Problem:   Nausea and vomiting Active Problems:   Acute renal failure superimposed on stage 3a chronic kidney disease (HCC)   SJOGREN'S SYNDROME   Chronic combined systolic and diastolic CHF (congestive heart failure) (HCC)   Generalized abdominal pain   Reflux esophagitis   Diabetic gastroparesis (HCC)   Type 2 diabetes mellitus with hyperlipidemia (HCC)   Obesity hypoventilation syndrome (HCC)   Essential hypertension   Normocytic anemia   History of CVA (cerebrovascular accident)   SLE (systemic lupus erythematosus related syndrome) (HCC)   Nausea & vomiting   Hypothyroidism   Anxiety and depression  Intractable nausea and vomiting due to diabetic gastroparesis: CT abdomen unremarkable.  Continues to improve but gradually.  She says that she vomited x 3 yesterday trying soft diet but she wants to continue to try soft diet today and hopes that she will go home tomorrow.  Will continue scheduled Reglan  and as needed Compazine .  She says that Zofran  and Phenergan  never works for her.  Hypokalemia: Resolved.  Type 2 diabetes mellitus: Takes metformin  at home.  Hemoglobin A1c 7.3 a month ago.  Continue SSI, blood sugar controlled.  History of DVT: On Eliquis  PTA but due to nausea and vomiting, currently on Lovenox .  History of chronic heart failure with preserved ejection fraction and hypertension: On Entresto , BiDil , Aldactone  and Coreg  at home.  Except diuretics, rest of the medications are continued and she is taking those medications.  History of SLE and Sjogren  syndrome: On immunosuppressants which patient has not taken at for last few days due to vomiting.  Holding them for now.  Anemia of chronic disease: Hemoglobin stable.  CKD stage IIIa: Stable.  GERD: Continue PPI.  Hyperlipidemia: Continue statin.  Chronic pain: Continue Lyrica .  Acquired hypothyroidism: Continue Synthroid .  Morbid obesity class II: BMI 50.  Weight loss and diet modification counseled.  DVT prophylaxis: Lovenox    Code Status: Full Code  Family Communication:  None present at bedside.  Plan of care discussed with patient in length and he/she verbalized understanding and agreed with it.  Status is: Inpatient Remains inpatient appropriate because: Still with nausea and vomiting.   Estimated body mass index is 50.18 kg/m as calculated from the following:   Height as of this encounter: 5' 8 (1.727 m).   Weight as of this encounter: 149.7 kg.    Nutritional Assessment: Body mass index is 50.18 kg/m.SABRA Seen by dietician.  I agree with the assessment and plan as outlined below: Nutrition Status:        . Skin Assessment: I have examined the patient's skin and I agree with the wound assessment as performed by the wound care RN as outlined below:    Consultants:  None  Procedures:  None  Antimicrobials:  Anti-infectives (From admission, onward)    None         Subjective: Seen and examined, still with nausea and vomiting.  Objective: Vitals:   03/31/24 0000 03/31/24 0416 03/31/24 0416 03/31/24 0732  BP: (!) 106/52 104/60 104/60 110/66  Pulse: 84 93 93 85  Resp: 18 18  18 18  Temp: 98.8 F (37.1 C) 98.5 F (36.9 C) 98.5 F (36.9 C) 98.4 F (36.9 C)  TempSrc: Oral Oral  Oral  SpO2: 99% 97% 97% 98%  Weight:      Height:        Intake/Output Summary (Last 24 hours) at 03/31/2024 0923 Last data filed at 03/31/2024 0035 Gross per 24 hour  Intake --  Output 850 ml  Net -850 ml    Filed Weights   03/25/24 1441  Weight: (!) 149.7 kg     Examination: General exam: Appears calm and comfortable  Respiratory system: Clear to auscultation. Respiratory effort normal. Cardiovascular system: S1 & S2 heard, RRR. No JVD, murmurs, rubs, gallops or clicks. No pedal edema. Gastrointestinal system: Abdomen is nondistended, soft and nontender. No organomegaly or masses felt. Normal bowel sounds heard. Central nervous system: Alert and oriented. No focal neurological deficits. Extremities: Symmetric 5 x 5 power. Skin: No rashes, lesions or ulcers.  Psychiatry: Judgement and insight appear normal. Mood & affect appropriate.   Data Reviewed: I have personally reviewed following labs and imaging studies  CBC: Recent Labs  Lab 03/25/24 1446 03/26/24 0323 03/27/24 0212 03/28/24 0258 03/30/24 0956  WBC 10.7* 10.2 9.1 8.1 7.7  NEUTROABS  --  6.0 5.9 4.4 4.3  HGB 11.2* 10.3* 9.6* 9.0* 9.9*  HCT 35.6* 33.3* 30.1* 28.5* 31.4*  MCV 90.8 92.0 90.4 90.5 90.2  PLT 229 184 165 156 158   Basic Metabolic Panel: Recent Labs  Lab 03/26/24 0323 03/27/24 0212 03/28/24 0258 03/29/24 0218 03/30/24 0956  NA 141 141 141 136 136  K 3.8 3.9 3.3* 3.4* 3.6  CL 107 110 111 106 106  CO2 21* 21* 24 20* 22  GLUCOSE 102* 128* 136* 133* 145*  BUN 11 9 8 6 8   CREATININE 1.24* 1.16* 1.19* 1.27* 1.40*  CALCIUM  8.7* 8.7* 8.5* 8.2* 8.7*  MG  --   --   --   --  1.6*   GFR: Estimated Creatinine Clearance: 72 mL/min (A) (by C-G formula based on SCr of 1.4 mg/dL (H)). Liver Function Tests: Recent Labs  Lab 03/24/24 1404 03/25/24 1446 03/26/24 0323 03/27/24 0212  AST 18 16 16 17   ALT 10 10 9 11   ALKPHOS 75 67 64 61  BILITOT 1.3* 1.4* 1.1 1.6*  PROT 6.9 6.4* 5.8* 5.7*  ALBUMIN  3.3* 2.9* 2.8* 2.9*   Recent Labs  Lab 03/24/24 1404 03/25/24 1446  LIPASE 24 26   No results for input(s): AMMONIA in the last 168 hours. Coagulation Profile: No results for input(s): INR, PROTIME in the last 168 hours. Cardiac Enzymes: No results for  input(s): CKTOTAL, CKMB, CKMBINDEX, TROPONINI in the last 168 hours. BNP (last 3 results) Recent Labs    07/20/23 1513  PROBNP 74   HbA1C: No results for input(s): HGBA1C in the last 72 hours. CBG: Recent Labs  Lab 03/30/24 1534 03/30/24 2025 03/31/24 0026 03/31/24 0417 03/31/24 0809  GLUCAP 188* 115* 108* 101* 116*   Lipid Profile: No results for input(s): CHOL, HDL, LDLCALC, TRIG, CHOLHDL, LDLDIRECT in the last 72 hours. Thyroid  Function Tests: No results for input(s): TSH, T4TOTAL, FREET4, T3FREE, THYROIDAB in the last 72 hours.  Anemia Panel: No results for input(s): VITAMINB12, FOLATE, FERRITIN, TIBC, IRON, RETICCTPCT in the last 72 hours. Sepsis Labs: No results for input(s): PROCALCITON, LATICACIDVEN in the last 168 hours.  No results found for this or any previous visit (from the past 240 hours).   Radiology Studies:  No results found.   Scheduled Meds:  carvedilol   25 mg Oral BID   enoxaparin  (LOVENOX ) injection  150 mg Subcutaneous Q12H   feeding supplement  237 mL Oral BID BM   fluticasone  furoate-vilanterol  1 puff Inhalation Daily   insulin  aspart  0-9 Units Subcutaneous Q4H   isosorbide -hydrALAZINE   1 tablet Oral TID   levothyroxine   50 mcg Oral Q0600   metoCLOPramide  (REGLAN ) injection  10 mg Intravenous TID AC   pantoprazole   40 mg Oral Daily   rosuvastatin   20 mg Oral Daily   sacubitril -valsartan   1 tablet Oral BID   spironolactone   12.5 mg Oral Daily   Continuous Infusions:  promethazine  (PHENERGAN ) injection (IM or IVPB)        LOS: 5 days   Fredia Skeeter, MD [facility]  03/31/2024, 9:23 AM   *Please note that this is a verbal dictation therefore any spelling or grammatical errors are due to the Dragon Medical One system interpretation.  Please page via Amion and do not message via secure chat for urgent patient care matters. Secure chat can be used for non urgent patient care  matters.  How to contact the [facility] Attending or Consulting provider 7A - 7P or covering provider during after hours 7P -7A, for this patient?  Check the care team in [facility] and look for a) attending/consulting [facility] provider listed and b) the [facility] team listed. Page or secure chat 7A-7P. Log into [email or web address] and use [place]'s universal password to access. If you do not have the password, please contact the hospital operator. Locate the [facility] provider you are looking for under [facility] and page to a number that you can be directly reached. If you still have difficulty reaching the provider, please page the [facility] (Director on Call) for the Hospitalists listed on amion for assistance.

## 2024-03-31 NOTE — Plan of Care (Signed)
  Problem: Coping: Goal: Ability to adjust to condition or change in health will improve Outcome: Not Progressing   Problem: Health Behavior/Discharge Planning: Goal: Ability to manage health-related needs will improve Outcome: Not Progressing   Problem: Skin Integrity: Goal: Risk for impaired skin integrity will decrease Outcome: Not Progressing

## 2024-04-01 DIAGNOSIS — R112 Nausea with vomiting, unspecified: Secondary | ICD-10-CM | POA: Diagnosis not present

## 2024-04-01 LAB — GLUCOSE, CAPILLARY
Glucose-Capillary: 101 mg/dL — ABNORMAL HIGH (ref 70–99)
Glucose-Capillary: 106 mg/dL — ABNORMAL HIGH (ref 70–99)
Glucose-Capillary: 152 mg/dL — ABNORMAL HIGH (ref 70–99)
Glucose-Capillary: 166 mg/dL — ABNORMAL HIGH (ref 70–99)
Glucose-Capillary: 210 mg/dL — ABNORMAL HIGH (ref 70–99)
Glucose-Capillary: 98 mg/dL (ref 70–99)

## 2024-04-01 MED ORDER — EMPAGLIFLOZIN 10 MG PO TABS
10.0000 mg | ORAL_TABLET | Freq: Every day | ORAL | Status: DC
  Administered 2024-04-02 – 2024-04-04 (×3): 10 mg via ORAL
  Filled 2024-04-01 (×4): qty 1

## 2024-04-01 MED ORDER — APIXABAN 2.5 MG PO TABS
2.5000 mg | ORAL_TABLET | Freq: Two times a day (BID) | ORAL | Status: DC
  Administered 2024-04-01 – 2024-04-04 (×7): 2.5 mg via ORAL
  Filled 2024-04-01 (×7): qty 1

## 2024-04-01 MED ORDER — AZATHIOPRINE 25 MG PO HALF TABLET
25.0000 mg | ORAL_TABLET | Freq: Two times a day (BID) | ORAL | Status: DC
  Administered 2024-04-01 – 2024-04-02 (×2): 25 mg via ORAL
  Filled 2024-04-01 (×8): qty 1

## 2024-04-01 MED ORDER — AZATHIOPRINE 50 MG PO TABS
50.0000 mg | ORAL_TABLET | Freq: Two times a day (BID) | ORAL | Status: DC
  Administered 2024-04-01 – 2024-04-02 (×2): 50 mg via ORAL
  Filled 2024-04-01 (×8): qty 1

## 2024-04-01 MED ORDER — HYDROXYCHLOROQUINE SULFATE 200 MG PO TABS
200.0000 mg | ORAL_TABLET | Freq: Two times a day (BID) | ORAL | Status: DC
  Administered 2024-04-01 – 2024-04-04 (×7): 200 mg via ORAL
  Filled 2024-04-01 (×8): qty 1

## 2024-04-01 MED ORDER — OXYBUTYNIN CHLORIDE ER 5 MG PO TB24
5.0000 mg | ORAL_TABLET | Freq: Every day | ORAL | Status: DC
Start: 2024-04-01 — End: 2024-04-04
  Administered 2024-04-01 – 2024-04-04 (×4): 5 mg via ORAL
  Filled 2024-04-01 (×4): qty 1

## 2024-04-01 MED ORDER — METOCLOPRAMIDE HCL 5 MG/5ML PO SOLN
10.0000 mg | Freq: Three times a day (TID) | ORAL | Status: DC
  Administered 2024-04-01 – 2024-04-04 (×9): 10 mg via ORAL
  Filled 2024-04-01 (×10): qty 10

## 2024-04-01 MED ORDER — METOCLOPRAMIDE HCL 5 MG/5ML PO SOLN: 10.0000 mg | Freq: Three times a day (TID) | ORAL | Status: DC

## 2024-04-01 MED ORDER — DICYCLOMINE HCL 10 MG PO CAPS
10.0000 mg | ORAL_CAPSULE | Freq: Three times a day (TID) | ORAL | Status: DC | PRN
  Administered 2024-04-01 – 2024-04-03 (×7): 10 mg via ORAL
  Filled 2024-04-01 (×7): qty 1

## 2024-04-01 MED ORDER — ACETAMINOPHEN 325 MG PO TABS
650.0000 mg | ORAL_TABLET | Freq: Four times a day (QID) | ORAL | Status: DC | PRN
  Administered 2024-04-01 – 2024-04-02 (×4): 650 mg via ORAL
  Filled 2024-04-01 (×4): qty 2

## 2024-04-01 MED ORDER — AZATHIOPRINE 75 MG PO TABS: 1.0000 | ORAL_TABLET | Freq: Two times a day (BID) | ORAL | Status: DC

## 2024-04-01 NOTE — Progress Notes (Signed)
 PROGRESS NOTE    [name]  FMW:[id number] DOB: 03-Aug-1970 DOA: 03/25/2024 PCP: [name], MD   Brief Narrative:  This is a pleasant 53 year old lady with multiple medical problems mainly type 2 diabetes mellitus and presumed gastroparesis presented to ED with nausea and vomiting and poor p.o. intake for last 8 days.  Admitted yet again with diabetic gastroparesis.  Assessment & Plan:   Principal Problem:   Nausea and vomiting Active Problems:   Acute renal failure superimposed on stage 3a chronic kidney disease (HCC)   SJOGREN'S SYNDROME   Chronic combined systolic and diastolic CHF (congestive heart failure) (HCC)   Generalized abdominal pain   Reflux esophagitis   Diabetic gastroparesis (HCC)   Type 2 diabetes mellitus with hyperlipidemia (HCC)   Obesity hypoventilation syndrome (HCC)   Essential hypertension   Normocytic anemia   History of CVA (cerebrovascular accident)   SLE (systemic lupus erythematosus related syndrome) (HCC)   Nausea & vomiting   Hypothyroidism   Anxiety and depression  Intractable nausea and vomiting due to diabetic gastroparesis: CT abdomen unremarkable.  Continues to improve but gradually.  She continues to improve but gradually.  Her last vomiting was around 4 PM yesterday.  Has mild nausea but still tolerating soft diet.  She is not fully comfortable going home yet.  We will observe her overnight.  I have discussed with the patient that I have discontinued morphine  because opioids are counterproductive and gastroparesis.  We will give her Bentyl  as needed for abdominal pain when needed.  She hesitantly agrees with that.  Hypokalemia: Resolved.  Type 2 diabetes mellitus: Takes metformin  at home.  Hemoglobin A1c 7.3 a month ago.  Continue SSI, blood sugar controlled.  History of DVT: On Eliquis  PTA but due to nausea and vomiting, currently on Lovenox .  Feeling better, will resume Eliquis  back.  History of chronic heart failure with  preserved ejection fraction and hypertension: On Entresto , BiDil , Aldactone  and Coreg  at home.  Except diuretics, rest of the medications are continued however patient has been having low blood pressure so we have been holding most of her antihypertensives for last 2 days as well.  History of SLE and Sjogren syndrome: On immunosuppressants which patient has not taken at for last few days due to vomiting.  Holding them for now.  However will resume them today to see if she tolerates.  Anemia of chronic disease: Hemoglobin stable.  CKD stage IIIa: Stable.  GERD: Continue PPI.  Hyperlipidemia: Continue statin.  Chronic pain: Continue Lyrica .  Acquired hypothyroidism: Continue Synthroid .  Morbid obesity class II: BMI 50.  Weight loss and diet modification counseled.  DVT prophylaxis: Lovenox    Code Status: Full Code  Family Communication:  None present at bedside.  Plan of care discussed with patient in length and he/she verbalized understanding and agreed with it.  Status is: Inpatient Remains inpatient appropriate because: Still with nausea and vomiting.  Will observe another night and resumption of the home medications.   Estimated body mass index is 50.18 kg/m as calculated from the following:   Height as of this encounter: 5' 8 (1.727 m).   Weight as of this encounter: 149.7 kg.    Nutritional Assessment: Body mass index is 50.18 kg/m.SABRA Seen by dietician.  I agree with the assessment and plan as outlined below: Nutrition Status:        . Skin Assessment: I have examined the patient's skin and I agree with the wound assessment as performed by the wound  care RN as outlined below:    Consultants:  None  Procedures:  None  Antimicrobials:  Anti-infectives (From admission, onward)    None         Subjective: Seen and examined.  Improving gradually.  Still with intermittent nausea.  Still does not feel comfortable going home.  Resuming all home medications  orally today to see if she tolerates them.  Objective: Vitals:   04/01/24 0102 04/01/24 0330 04/01/24 0824 04/01/24 0853  BP: 123/60 114/60 97/71   Pulse: 88 93 93   Resp: 19 19    Temp: 98.6 F (37 C) 98.2 F (36.8 C) 98.5 F (36.9 C)   TempSrc:      SpO2: 97% 98% 100% 99%  Weight:      Height:        Intake/Output Summary (Last 24 hours) at 04/01/2024 1056 Last data filed at 03/31/2024 2000 Gross per 24 hour  Intake 240 ml  Output 300 ml  Net -60 ml    Filed Weights   03/25/24 1441  Weight: (!) 149.7 kg    Examination:  General exam: Appears calm and comfortable  Respiratory system: Clear to auscultation. Respiratory effort normal. Cardiovascular system: S1 & S2 heard, RRR. No JVD, murmurs, rubs, gallops or clicks. No pedal edema. Gastrointestinal system: Abdomen is nondistended, soft and nontender. No organomegaly or masses felt. Normal bowel sounds heard. Central nervous system: Alert and oriented. No focal neurological deficits. Extremities: Symmetric 5 x 5 power. Skin: No rashes, lesions or ulcers.  Psychiatry: Judgement and insight appear normal. Mood & affect appropriate.   Data Reviewed: I have personally reviewed following labs and imaging studies  CBC: Recent Labs  Lab 03/25/24 1446 03/26/24 0323 03/27/24 0212 03/28/24 0258 03/30/24 0956  WBC 10.7* 10.2 9.1 8.1 7.7  NEUTROABS  --  6.0 5.9 4.4 4.3  HGB 11.2* 10.3* 9.6* 9.0* 9.9*  HCT 35.6* 33.3* 30.1* 28.5* 31.4*  MCV 90.8 92.0 90.4 90.5 90.2  PLT 229 184 165 156 158   Basic Metabolic Panel: Recent Labs  Lab 03/26/24 0323 03/27/24 0212 03/28/24 0258 03/29/24 0218 03/30/24 0956  NA 141 141 141 136 136  K 3.8 3.9 3.3* 3.4* 3.6  CL 107 110 111 106 106  CO2 21* 21* 24 20* 22  GLUCOSE 102* 128* 136* 133* 145*  BUN 11 9 8 6 8   CREATININE 1.24* 1.16* 1.19* 1.27* 1.40*  CALCIUM  8.7* 8.7* 8.5* 8.2* 8.7*  MG  --   --   --   --  1.6*   GFR: Estimated Creatinine Clearance: 72 mL/min (A) (by C-G  formula based on SCr of 1.4 mg/dL (H)). Liver Function Tests: Recent Labs  Lab 03/25/24 1446 03/26/24 0323 03/27/24 0212  AST 16 16 17   ALT 10 9 11   ALKPHOS 67 64 61  BILITOT 1.4* 1.1 1.6*  PROT 6.4* 5.8* 5.7*  ALBUMIN  2.9* 2.8* 2.9*   Recent Labs  Lab 03/25/24 1446  LIPASE 26   No results for input(s): AMMONIA in the last 168 hours. Coagulation Profile: No results for input(s): INR, PROTIME in the last 168 hours. Cardiac Enzymes: No results for input(s): CKTOTAL, CKMB, CKMBINDEX, TROPONINI in the last 168 hours. BNP (last 3 results) Recent Labs    07/20/23 1513  PROBNP 74   HbA1C: No results for input(s): HGBA1C in the last 72 hours. CBG: Recent Labs  Lab 03/31/24 1557 03/31/24 2146 04/01/24 0058 04/01/24 0332 04/01/24 0824  GLUCAP 132* 143* 106* 98 101*  Lipid Profile: No results for input(s): CHOL, HDL, LDLCALC, TRIG, CHOLHDL, LDLDIRECT in the last 72 hours. Thyroid  Function Tests: No results for input(s): TSH, T4TOTAL, FREET4, T3FREE, THYROIDAB in the last 72 hours.  Anemia Panel: No results for input(s): VITAMINB12, FOLATE, FERRITIN, TIBC, IRON, RETICCTPCT in the last 72 hours. Sepsis Labs: No results for input(s): PROCALCITON, LATICACIDVEN in the last 168 hours.  No results found for this or any previous visit (from the past 240 hours).   Radiology Studies: No results found.   Scheduled Meds:  carvedilol   25 mg Oral BID   enoxaparin  (LOVENOX ) injection  150 mg Subcutaneous Q12H   feeding supplement  237 mL Oral BID BM   fluticasone  furoate-vilanterol  1 puff Inhalation Daily   insulin  aspart  0-9 Units Subcutaneous Q4H   isosorbide -hydrALAZINE   1 tablet Oral TID   levothyroxine   50 mcg Oral Q0600   metoCLOPramide  (REGLAN ) injection  10 mg Intravenous TID AC   pantoprazole   40 mg Oral Daily   rosuvastatin   20 mg Oral Daily   sacubitril -valsartan   1 tablet Oral BID   spironolactone   12.5  mg Oral Daily   Continuous Infusions:  promethazine  (PHENERGAN ) injection (IM or IVPB)        LOS: 6 days   Fredia Skeeter, MD [facility]  04/01/2024, 10:56 AM   *Please note that this is a verbal dictation therefore any spelling or grammatical errors are due to the Dragon Medical One system interpretation.  Please page via Amion and do not message via secure chat for urgent patient care matters. Secure chat can be used for non urgent patient care matters.  How to contact the [facility] Attending or Consulting provider 7A - 7P or covering provider during after hours 7P -7A, for this patient?  Check the care team in [facility] and look for a) attending/consulting [facility] provider listed and b) the [facility] team listed. Page or secure chat 7A-7P. Log into [email or web address] and use [place]'s universal password to access. If you do not have the password, please contact the hospital operator. Locate the [facility] provider you are looking for under [facility] and page to a number that you can be directly reached. If you still have difficulty reaching the provider, please page the [facility] (Director on Call) for the Hospitalists listed on amion for assistance.

## 2024-04-01 NOTE — Progress Notes (Signed)
 There was 2nd assess regarding placing PIV access. Assessed both arms with ultrasound. Rt. Lower arm was infiltrated with +3-4 edema and Upper arm- partial compressible cephalic vein, brachial & basilic were small with more than 3 cm deep which is not long enough to stay in the vein. Lt. Lower arm was very small and 2 cm deep veins; upper arm- some part of cephalic partial compressible, there was compressible and good size vein, but  not long enough to stay in the vein due to bifurcation. Brachial & basilic were small and deep. Explained patient to show the US  monitor. Informed patient's RN regarding this finding. HS [employer]

## 2024-04-01 NOTE — Plan of Care (Signed)
  Problem: Coping: Goal: Ability to adjust to condition or change in health will improve Outcome: Not Progressing   Problem: Health Behavior/Discharge Planning: Goal: Ability to manage health-related needs will improve Outcome: Not Progressing   Problem: Skin Integrity: Goal: Risk for impaired skin integrity will decrease Outcome: Not Progressing

## 2024-04-01 NOTE — Plan of Care (Signed)
  Problem: Coping: Goal: Ability to adjust to condition or change in health will improve Outcome: Progressing   Problem: Fluid Volume: Goal: Ability to maintain a balanced intake and output will improve Outcome: Progressing   Problem: Metabolic: Goal: Ability to maintain appropriate glucose levels will improve Outcome: Progressing   Problem: Skin Integrity: Goal: Risk for impaired skin integrity will decrease Outcome: Progressing

## 2024-04-02 ENCOUNTER — Encounter (HOSPITAL_BASED_OUTPATIENT_CLINIC_OR_DEPARTMENT_OTHER): Admitting: General Surgery

## 2024-04-02 DIAGNOSIS — R112 Nausea with vomiting, unspecified: Secondary | ICD-10-CM | POA: Diagnosis not present

## 2024-04-02 LAB — GLUCOSE, CAPILLARY
Glucose-Capillary: 111 mg/dL — ABNORMAL HIGH (ref 70–99)
Glucose-Capillary: 112 mg/dL — ABNORMAL HIGH (ref 70–99)
Glucose-Capillary: 113 mg/dL — ABNORMAL HIGH (ref 70–99)
Glucose-Capillary: 123 mg/dL — ABNORMAL HIGH (ref 70–99)
Glucose-Capillary: 135 mg/dL — ABNORMAL HIGH (ref 70–99)
Glucose-Capillary: 143 mg/dL — ABNORMAL HIGH (ref 70–99)

## 2024-04-02 LAB — CBC WITH DIFFERENTIAL/PLATELET
Abs Immature Granulocytes: 0.02 K/uL (ref 0.00–0.07)
Basophils Absolute: 0 K/uL (ref 0.0–0.1)
Basophils Relative: 1 %
Eosinophils Absolute: 0.5 K/uL (ref 0.0–0.5)
Eosinophils Relative: 6 %
HCT: 29.8 % — ABNORMAL LOW (ref 36.0–46.0)
Hemoglobin: 9.5 g/dL — ABNORMAL LOW (ref 12.0–15.0)
Immature Granulocytes: 0 %
Lymphocytes Relative: 20 %
Lymphs Abs: 1.6 K/uL (ref 0.7–4.0)
MCH: 28.5 pg (ref 26.0–34.0)
MCHC: 31.9 g/dL (ref 30.0–36.0)
MCV: 89.5 fL (ref 80.0–100.0)
Monocytes Absolute: 1.3 K/uL — ABNORMAL HIGH (ref 0.1–1.0)
Monocytes Relative: 16 %
Neutro Abs: 4.7 K/uL (ref 1.7–7.7)
Neutrophils Relative %: 57 %
Platelets: 136 K/uL — ABNORMAL LOW (ref 150–400)
RBC: 3.33 MIL/uL — ABNORMAL LOW (ref 3.87–5.11)
RDW: 14.6 % (ref 11.5–15.5)
WBC: 8.2 K/uL (ref 4.0–10.5)
nRBC: 0 % (ref 0.0–0.2)

## 2024-04-02 LAB — BASIC METABOLIC PANEL WITH GFR
Anion gap: 8 (ref 5–15)
BUN: 6 mg/dL (ref 6–20)
CO2: 23 mmol/L (ref 22–32)
Calcium: 8.4 mg/dL — ABNORMAL LOW (ref 8.9–10.3)
Chloride: 106 mmol/L (ref 98–111)
Creatinine, Ser: 0.95 mg/dL (ref 0.44–1.00)
GFR, Estimated: >60 mL/min (ref >60)
Glucose, Bld: 126 mg/dL — ABNORMAL HIGH (ref 70–99)
Potassium: 3.4 mmol/L — ABNORMAL LOW (ref 3.5–5.1)
Sodium: 137 mmol/L (ref 135–145)

## 2024-04-02 MED ORDER — INSULIN ASPART 100 UNIT/ML IJ SOLN
0.0000 [IU] | Freq: Three times a day (TID) | INTRAMUSCULAR | Status: DC
  Administered 2024-04-02: 2 [IU] via SUBCUTANEOUS
  Administered 2024-04-03 (×2): 3 [IU] via SUBCUTANEOUS

## 2024-04-02 MED ORDER — INSULIN ASPART 100 UNIT/ML IJ SOLN: 0.0000 [IU] | Freq: Every day | INTRAMUSCULAR | Status: DC

## 2024-04-02 MED ORDER — POTASSIUM CHLORIDE 10 MEQ/100ML IV SOLN
10.0000 meq | INTRAVENOUS | Status: AC
  Administered 2024-04-02: 10 meq via INTRAVENOUS
  Filled 2024-04-02 (×3): qty 100

## 2024-04-02 MED ORDER — SODIUM CHLORIDE 0.9 % IV SOLN
3.0000 mg/kg | Freq: Three times a day (TID) | INTRAVENOUS | Status: DC
  Administered 2024-04-02 – 2024-04-03 (×4): 450 mg via INTRAVENOUS
  Filled 2024-04-02 (×10): qty 9

## 2024-04-02 MED ORDER — NALOXONE HCL 0.4 MG/ML IJ SOLN: 0.4000 mg | INTRAMUSCULAR | Status: DC | PRN

## 2024-04-02 MED ORDER — POTASSIUM CHLORIDE 10 MEQ/100ML IV SOLN
10.0000 meq | INTRAVENOUS | Status: AC
  Administered 2024-04-02 (×3): 10 meq via INTRAVENOUS

## 2024-04-02 MED ORDER — HYDROMORPHONE HCL 1 MG/ML IJ SOLN
0.2500 mg | Freq: Once | INTRAMUSCULAR | Status: AC | PRN
  Administered 2024-04-02: 0.25 mg via INTRAVENOUS
  Filled 2024-04-02: qty 1

## 2024-04-02 NOTE — Plan of Care (Signed)
  Problem: Activity: Goal: Risk for activity intolerance will decrease Outcome: Not Progressing   Problem: Pain Managment: Goal: General experience of comfort will improve and/or be controlled Outcome: Not Progressing   Problem: Skin Integrity: Goal: Risk for impaired skin integrity will decrease Outcome: Not Progressing

## 2024-04-02 NOTE — Progress Notes (Signed)
 Mobility Specialist: Progress Note   04/02/24 1547  Mobility  Activity Ambulated with assistance;Pivoted/transferred to/from [facility]  Level of Assistance Standby assist, set-up cues, supervision of patient - no hands on  Assistive Device Four wheel walker  Distance Ambulated (ft) 50 ft  Activity Response Tolerated well  Mobility Referral Yes  Mobility visit 1 Mobility  Mobility Specialist Start Time (ACUTE ONLY) 1113  Mobility Specialist Stop Time (ACUTE ONLY) 1132  Mobility Specialist Time Calculation (min) (ACUTE ONLY) 19 min    Pt received in bed, agreeable to mobility session. SV throughout w/ barry rollator. Needed to take seated breaks every 15-25' d/t fatigue and bil leg weakness. SpO2 98% on RA. Ambulated 50' down the hallway  but unable to walk back this session, requested to roll back to the room in the rollator seat. Returned to room. Left on EOB with all needs met, call bell in reach.   Ileana Lute Mobility Specialist Please contact via SecureChat or Rehab office at [phone]

## 2024-04-02 NOTE — Progress Notes (Signed)
 PROGRESS NOTE    STEPHINIE BATTISTI  FMW:[id number] DOB: Dec 03, 1970 DOA: 03/25/2024 PCP: [name], MD   Brief Narrative:  This is a pleasant 53 year old lady with multiple medical problems mainly type 2 diabetes mellitus and presumed gastroparesis presented to ED with nausea and vomiting and poor p.o. intake for last 8 days.  Admitted yet again with diabetic gastroparesis.  Assessment & Plan:   Principal Problem:   Nausea and vomiting Active Problems:   Acute renal failure superimposed on stage 3a chronic kidney disease (HCC)   SJOGREN'S SYNDROME   Chronic combined systolic and diastolic CHF (congestive heart failure) (HCC)   Generalized abdominal pain   Reflux esophagitis   Diabetic gastroparesis (HCC)   Type 2 diabetes mellitus with hyperlipidemia (HCC)   Obesity hypoventilation syndrome (HCC)   Essential hypertension   Normocytic anemia   History of CVA (cerebrovascular accident)   SLE (systemic lupus erythematosus related syndrome) (HCC)   Nausea & vomiting   Hypothyroidism   Anxiety and depression  Intractable nausea and vomiting due to diabetic gastroparesis: CT abdomen unremarkable.  She says that she has been vomiting again with soft diet.  Since she has not improved as expected with Reglan , I will now start the trial of erythromycin .  Will however continue her on soft diet.    Hypokalemia: Resolved.  Checking BMP again today.  Type 2 diabetes mellitus: Takes metformin  at home.  Hemoglobin A1c 7.3 a month ago.  Continue SSI, blood sugar controlled.  History of DVT: On Eliquis  PTA but due to nausea and vomiting, she was on Lovenox , resumed Eliquis  04/01/2024.  History of chronic heart failure with preserved ejection fraction and hypertension: On Entresto , BiDil , Aldactone  and Coreg  at home.  Except diuretics, rest of the medications are continued however patient has been having low blood pressure so we have been holding most of her antihypertensives for last 2 days  as well.  History of SLE and Sjogren syndrome: On immunosuppressants which patient has not taken at for last few days due to vomiting.  Holding them for now.  However will resume them today to see if she tolerates.  Anemia of chronic disease: Hemoglobin stable.  CKD stage IIIa: Stable.  GERD: Continue PPI.  Hyperlipidemia: Continue statin.  Chronic pain: Continue Lyrica .  Acquired hypothyroidism: Continue Synthroid .  Morbid obesity class II: BMI 50.  Weight loss and diet modification counseled.  DVT prophylaxis: apixaban  (ELIQUIS ) tablet 2.5 mg Start: 04/01/24 1230Lovenox   Code Status: Full Code  Family Communication:  None present at bedside.  Plan of care discussed with patient in length and he/she verbalized understanding and agreed with it.  Status is: Inpatient Remains inpatient appropriate because: Still with nausea and vomiting.  Will observe another night and resumption of the home medications.   Estimated body mass index is 50.18 kg/m as calculated from the following:   Height as of this encounter: 5' 8 (1.727 m).   Weight as of this encounter: 149.7 kg.    Nutritional Assessment: Body mass index is 50.18 kg/m.SABRA Seen by dietician.  I agree with the assessment and plan as outlined below: Nutrition Status:        . Skin Assessment: I have examined the patient's skin and I agree with the wound assessment as performed by the wound care RN as outlined below:    Consultants:  None  Procedures:  None  Antimicrobials:  Anti-infectives (From admission, onward)    Start     Dose/Rate Route Frequency Ordered  Stop   04/02/24 1100  erythromycin  450 mg in sodium chloride  0.9 % 100 mL IVPB        3 mg/kg  149.7 kg 100 mL/hr over 60 Minutes Intravenous 3 times daily before meals 04/02/24 0945     04/01/24 1145  hydroxychloroquine  (PLAQUENIL ) tablet 200 mg        200 mg Oral 2 times daily 04/01/24 1100           Subjective: Patient seen and examined, she  is having recurrence of the nausea and vomiting, worse than yesterday.  No other complaint.  She was tearful.  Objective: Vitals:   04/01/24 2015 04/02/24 0005 04/02/24 0410 04/02/24 0717  BP: (!) 97/54 112/70 (!) 147/82 136/76  Pulse: 95 90 94 89  Resp: 18 18 18 19   Temp: 98.8 F (37.1 C) 99 F (37.2 C) 98.6 F (37 C) 98.9 F (37.2 C)  TempSrc: Oral Oral Oral Oral  SpO2: 99% 99% 100% 100%  Weight:      Height:        Intake/Output Summary (Last 24 hours) at 04/02/2024 0945 Last data filed at 04/02/2024 0410 Gross per 24 hour  Intake --  Output 400 ml  Net -400 ml    Filed Weights   03/25/24 1441  Weight: (!) 149.7 kg    Examination: General exam: Appears emotional and tearful Respiratory system: Clear to auscultation. Respiratory effort normal. Cardiovascular system: S1 & S2 heard, RRR. No JVD, murmurs, rubs, gallops or clicks. No pedal edema. Gastrointestinal system: Abdomen is nondistended, soft and nontender. No organomegaly or masses felt. Normal bowel sounds heard. Central nervous system: Alert and oriented. No focal neurological deficits. Extremities: Symmetric 5 x 5 power. Skin: No rashes, lesions or ulcers.  Psychiatry: Judgement and insight appear normal. Mood & affect appropriate.    Data Reviewed: I have personally reviewed following labs and imaging studies  CBC: Recent Labs  Lab 03/27/24 0212 03/28/24 0258 03/30/24 0956  WBC 9.1 8.1 7.7  NEUTROABS 5.9 4.4 4.3  HGB 9.6* 9.0* 9.9*  HCT 30.1* 28.5* 31.4*  MCV 90.4 90.5 90.2  PLT 165 156 158   Basic Metabolic Panel: Recent Labs  Lab 03/27/24 0212 03/28/24 0258 03/29/24 0218 03/30/24 0956  NA 141 141 136 136  K 3.9 3.3* 3.4* 3.6  CL 110 111 106 106  CO2 21* 24 20* 22  GLUCOSE 128* 136* 133* 145*  BUN 9 8 6 8   CREATININE 1.16* 1.19* 1.27* 1.40*  CALCIUM  8.7* 8.5* 8.2* 8.7*  MG  --   --   --  1.6*   GFR: Estimated Creatinine Clearance: 72 mL/min (A) (by C-G formula based on SCr of 1.4  mg/dL (H)). Liver Function Tests: Recent Labs  Lab 03/27/24 0212  AST 17  ALT 11  ALKPHOS 61  BILITOT 1.6*  PROT 5.7*  ALBUMIN  2.9*   No results for input(s): LIPASE, AMYLASE in the last 168 hours.  No results for input(s): AMMONIA in the last 168 hours. Coagulation Profile: No results for input(s): INR, PROTIME in the last 168 hours. Cardiac Enzymes: No results for input(s): CKTOTAL, CKMB, CKMBINDEX, TROPONINI in the last 168 hours. BNP (last 3 results) Recent Labs    07/20/23 1513  PROBNP 74   HbA1C: No results for input(s): HGBA1C in the last 72 hours. CBG: Recent Labs  Lab 04/01/24 1647 04/01/24 2043 04/02/24 0007 04/02/24 0453 04/02/24 0719  GLUCAP 166* 152* 123* 113* 111*   Lipid Profile: No results for input(s):  CHOL, HDL, LDLCALC, TRIG, CHOLHDL, LDLDIRECT in the last 72 hours. Thyroid  Function Tests: No results for input(s): TSH, T4TOTAL, FREET4, T3FREE, THYROIDAB in the last 72 hours.  Anemia Panel: No results for input(s): VITAMINB12, FOLATE, FERRITIN, TIBC, IRON, RETICCTPCT in the last 72 hours. Sepsis Labs: No results for input(s): PROCALCITON, LATICACIDVEN in the last 168 hours.  No results found for this or any previous visit (from the past 240 hours).   Radiology Studies: No results found.   Scheduled Meds:  apixaban   2.5 mg Oral BID   azaTHIOprine   25 mg Oral BID   And   azaTHIOprine   50 mg Oral BID   carvedilol   25 mg Oral BID   empagliflozin   10 mg Oral Daily   feeding supplement  237 mL Oral BID BM   fluticasone  furoate-vilanterol  1 puff Inhalation Daily   hydroxychloroquine   200 mg Oral BID   insulin  aspart  0-15 Units Subcutaneous TID WC   insulin  aspart  0-5 Units Subcutaneous QHS   isosorbide -hydrALAZINE   1 tablet Oral TID   levothyroxine   50 mcg Oral Q0600   metoCLOPramide   10 mg Oral TID AC   oxybutynin   5 mg Oral Daily   pantoprazole   40 mg Oral Daily    rosuvastatin   20 mg Oral Daily   sacubitril -valsartan   1 tablet Oral BID   spironolactone   12.5 mg Oral Daily   Continuous Infusions:  erythromycin      promethazine  (PHENERGAN ) injection (IM or IVPB)        LOS: 7 days   Fredia Skeeter, MD [facility]  04/02/2024, 9:45 AM   *Please note that this is a verbal dictation therefore any spelling or grammatical errors are due to the Dragon Medical One system interpretation.  Please page via Amion and do not message via secure chat for urgent patient care matters. Secure chat can be used for non urgent patient care matters.  How to contact the TRH Attending or Consulting provider 7A - 7P or covering provider during after hours 7P -7A, for this patient?  Check the care team in [facility] and look for a) attending/consulting TRH provider listed and b) the TRH team listed. Page or secure chat 7A-7P. Log into [email or web address] and use [place]'s universal password to access. If you do not have the password, please contact the hospital operator. Locate the TR[facility] provider you are looking for under [facility] and page to a number that you can be directly reached. If you still have difficulty reaching the provider, please page the [facility] (Director on Call) for the Hospitalists listed on amion for assistance.

## 2024-04-02 NOTE — Congregational Nurse Program (Signed)
 VAST consult received to obtain IV access. Right lower arm and hand moderately edematous; patient reports swelling has decreased from yesterday.  Assessed left arm utilizing ultrasound; vessels with frequent bifurcations and too small in diameter for a 24G; upper arm cephalic only partially compressible and small, brachial and basilic small and too deep.  Assessed right arm above previous IV site utilizing ultrasound; upper arm cephalic only partially compressible, brachial and basilic small and too deep.  Able to place a 22G 1.75 inch IV in posterior medial forearm, but it was the ONLY vein noted.  If patient continues to need IV access, alternate access such as a central line or IO will be needed.

## 2024-04-03 DIAGNOSIS — R112 Nausea with vomiting, unspecified: Secondary | ICD-10-CM | POA: Diagnosis not present

## 2024-04-03 LAB — GLUCOSE, CAPILLARY
Glucose-Capillary: 110 mg/dL — ABNORMAL HIGH (ref 70–99)
Glucose-Capillary: 159 mg/dL — ABNORMAL HIGH (ref 70–99)
Glucose-Capillary: 155 mg/dL — ABNORMAL HIGH (ref 70–99)
Glucose-Capillary: 114 mg/dL — ABNORMAL HIGH (ref 70–99)

## 2024-04-03 MED ORDER — POTASSIUM CHLORIDE 10 MEQ/100ML IV SOLN
10.0000 meq | INTRAVENOUS | Status: AC
  Administered 2024-04-03: 10 meq via INTRAVENOUS
  Filled 2024-04-03: qty 100

## 2024-04-03 MED ORDER — ONDANSETRON 4 MG PO TBDP: 4.0000 mg | ORAL_TABLET | Freq: Once | ORAL | Status: DC | PRN

## 2024-04-03 NOTE — Progress Notes (Signed)
 PROGRESS NOTE    [name]  FMW:[id number] DOB: [birth year] DOA: 03/25/2024 PCP: [name], MD   Brief Narrative:  This is a pleasant 53 year old lady with multiple medical problems mainly type 2 diabetes mellitus and presumed gastroparesis presented to ED with nausea and vomiting and poor p.o. intake for last 8 days.  Admitted yet again with diabetic gastroparesis.  Assessment & Plan:   Principal Problem:   Nausea and vomiting Active Problems:   Acute renal failure superimposed on stage 3a chronic kidney disease (HCC)   SJOGREN'S SYNDROME   Chronic combined systolic and diastolic CHF (congestive heart failure) (HCC)   Generalized abdominal pain   Reflux esophagitis   Diabetic gastroparesis (HCC)   Type 2 diabetes mellitus with hyperlipidemia (HCC)   Obesity hypoventilation syndrome (HCC)   Essential hypertension   Normocytic anemia   History of CVA (cerebrovascular accident)   SLE (systemic lupus erythematosus related syndrome) (HCC)   Nausea & vomiting   Hypothyroidism   Anxiety and depression  Intractable nausea and vomiting due to diabetic gastroparesis: CT abdomen unremarkable.  Did not improve as expected on Reglan , started on erythromycin  yesterday.  She says that she is feeling better today.  However she had small amount of vomiting this morning.  She had a lengthy discussion with me about Benadryl .  Stating that she believes that she is not getting better because she is not getting Benadryl  along with Reglan .  I educated her that Benadryl  is not the treatment for gastroparesis and that is not the reason that she is not improving.  That was perhaps being given to her for allergy.  She has not received Benadryl  and has not had any allergy.  Her chart has listed allergy of  restless leg syndrome due to Reglan  however she has not complained of that as well and Benadryl  is not the treatment for that either.  I explained to her in detail.  Also, patient keeps her  room very dark with curtains down every time and she does not move at all.  I had to be blunt with her about that and strongly advised to keep her room lit and brighter to uplift her mood and walk in the hall couple of times as those factors also play in healing.  I have placed orders to ambulate her 3 times daily.  We set clear expectations that patient has been here for very long time and she will most likely be discharged tomorrow.  She verbalized understanding and is in agreement with that.  Hypokalemia: Low, will replenish.  Type 2 diabetes mellitus: Takes metformin  at home.  Hemoglobin A1c 7.3 a month ago.  Continue SSI, blood sugar controlled.  History of DVT: On Eliquis  PTA but due to nausea and vomiting, she was on Lovenox , resumed Eliquis  04/01/2024.  History of chronic heart failure with preserved ejection fraction and hypertension: On Entresto , BiDil , Aldactone  and Coreg  at home.  Except diuretics, rest of the medications are continued however patient has been having low blood pressure so we have been holding most of her antihypertensives for last 2 days as well.  History of SLE and Sjogren syndrome: On immunosuppressants which patient has not taken at for last few days due to vomiting.  Holding them for now.  However will resume them today to see if she tolerates.  Anemia of chronic disease: Hemoglobin stable.  CKD stage IIIa: Stable.  GERD: Continue PPI.  Hyperlipidemia: Continue statin.  Chronic pain: Continue Lyrica .  Acquired hypothyroidism: Continue  Synthroid .  Morbid obesity class II: BMI 50.  Weight loss and diet modification counseled.  DVT prophylaxis: apixaban  (ELIQUIS ) tablet 2.5 mg Start: 04/01/24 1230Lovenox   Code Status: Full Code  Family Communication:  None present at bedside.  Plan of care discussed with patient in length and he/she verbalized understanding and agreed with it.  Status is: Inpatient Remains inpatient appropriate because: Still with nausea  and vomiting.  Will observe another night and resumption of the home medications.   Estimated body mass index is 50.18 kg/m as calculated from the following:   Height as of this encounter: 5' 8 (1.727 m).   Weight as of this encounter: 149.7 kg.    Nutritional Assessment: Body mass index is 50.18 kg/m.SABRA Seen by dietician.  I agree with the assessment and plan as outlined below: Nutrition Status:        . Skin Assessment: I have examined the patient's skin and I agree with the wound assessment as performed by the wound care RN as outlined below:    Consultants:  None  Procedures:  None  Antimicrobials:  Anti-infectives (From admission, onward)    Start     Dose/Rate Route Frequency Ordered Stop   04/02/24 1100  erythromycin  450 mg in sodium chloride  0.9 % 100 mL IVPB        3 mg/kg  149.7 kg 100 mL/hr over 60 Minutes Intravenous 3 times daily before meals 04/02/24 0945     04/01/24 1145  hydroxychloroquine  (PLAQUENIL ) tablet 200 mg        200 mg Oral 2 times daily 04/01/24 1100           Subjective: Patient seen and examined.  Feeling better today.  Objective: Vitals:   04/02/24 1533 04/02/24 2035 04/03/24 0502 04/03/24 0759  BP: 134/73 126/70 (!) 97/58 121/71  Pulse: 83 82 84 91  Resp: 19   19  Temp: 98.5 F (36.9 C) 98.6 F (37 C) 97.8 F (36.6 C) 98.6 F (37 C)  TempSrc: Oral  Oral Oral  SpO2: 100% 100% 100% 99%  Weight:      Height:        Intake/Output Summary (Last 24 hours) at 04/03/2024 1103 Last data filed at 04/02/2024 2338 Gross per 24 hour  Intake 300 ml  Output --  Net 300 ml    Filed Weights   03/25/24 1441  Weight: (!) 149.7 kg    Examination:  General exam: Appears calm and comfortable, obese Respiratory system: Clear to auscultation. Respiratory effort normal. Cardiovascular system: S1 & S2 heard, RRR. No JVD, murmurs, rubs, gallops or clicks. No pedal edema. Gastrointestinal system: Abdomen is nondistended, soft and  nontender. No organomegaly or masses felt. Normal bowel sounds heard. Central nervous system: Alert and oriented. No focal neurological deficits. Extremities: Symmetric 5 x 5 power. Skin: No rashes, lesions or ulcers.    Data Reviewed: I have personally reviewed following labs and imaging studies  CBC: Recent Labs  Lab 03/28/24 0258 03/30/24 0956 04/02/24 1011  WBC 8.1 7.7 8.2  NEUTROABS 4.4 4.3 4.7  HGB 9.0* 9.9* 9.5*  HCT 28.5* 31.4* 29.8*  MCV 90.5 90.2 89.5  PLT 156 158 136*   Basic Metabolic Panel: Recent Labs  Lab 03/28/24 0258 03/29/24 0218 03/30/24 0956 04/02/24 1011  NA 141 136 136 137  K 3.3* 3.4* 3.6 3.4*  CL 111 106 106 106  CO2 24 20* 22 23  GLUCOSE 136* 133* 145* 126*  BUN 8 6 8  6  CREATININE 1.19* 1.27* 1.40* 0.95  CALCIUM  8.5* 8.2* 8.7* 8.4*  MG  --   --  1.6*  --    GFR: Estimated Creatinine Clearance: 106.2 mL/min (by C-G formula based on SCr of 0.95 mg/dL). Liver Function Tests: No results for input(s): AST, ALT, ALKPHOS, BILITOT, PROT, ALBUMIN  in the last 168 hours.  No results for input(s): LIPASE, AMYLASE in the last 168 hours.  No results for input(s): AMMONIA in the last 168 hours. Coagulation Profile: No results for input(s): INR, PROTIME in the last 168 hours. Cardiac Enzymes: No results for input(s): CKTOTAL, CKMB, CKMBINDEX, TROPONINI in the last 168 hours. BNP (last 3 results) Recent Labs    07/20/23 1513  PROBNP 74   HbA1C: No results for input(s): HGBA1C in the last 72 hours. CBG: Recent Labs  Lab 04/02/24 0719 04/02/24 1208 04/02/24 1535 04/02/24 2132 04/03/24 0800  GLUCAP 111* 135* 143* 112* 110*   Lipid Profile: No results for input(s): CHOL, HDL, LDLCALC, TRIG, CHOLHDL, LDLDIRECT in the last 72 hours. Thyroid  Function Tests: No results for input(s): TSH, T4TOTAL, FREET4, T3FREE, THYROIDAB in the last 72 hours.  Anemia Panel: No results for input(s):  VITAMINB12, FOLATE, FERRITIN, TIBC, IRON, RETICCTPCT in the last 72 hours. Sepsis Labs: No results for input(s): PROCALCITON, LATICACIDVEN in the last 168 hours.  No results found for this or any previous visit (from the past 240 hours).   Radiology Studies: No results found.   Scheduled Meds:  apixaban   2.5 mg Oral BID   azaTHIOprine   25 mg Oral BID   And   azaTHIOprine   50 mg Oral BID   carvedilol   25 mg Oral BID   empagliflozin   10 mg Oral Daily   feeding supplement  237 mL Oral BID BM   fluticasone  furoate-vilanterol  1 puff Inhalation Daily   hydroxychloroquine   200 mg Oral BID   insulin  aspart  0-15 Units Subcutaneous TID WC   insulin  aspart  0-5 Units Subcutaneous QHS   isosorbide -hydrALAZINE   1 tablet Oral TID   levothyroxine   50 mcg Oral Q0600   metoCLOPramide   10 mg Oral TID AC   oxybutynin   5 mg Oral Daily   pantoprazole   40 mg Oral Daily   rosuvastatin   20 mg Oral Daily   sacubitril -valsartan   1 tablet Oral BID   spironolactone   12.5 mg Oral Daily   Continuous Infusions:  erythromycin  450 mg (04/03/24 0545)   promethazine  (PHENERGAN ) injection (IM or IVPB)        LOS: 8 days   Fredia Skeeter, MD [facility]  04/03/2024, 11:03 AM   *Please note that this is a verbal dictation therefore any spelling or grammatical errors are due to the Dragon Medical One system interpretation.  Please page via Amion and do not message via secure chat for urgent patient care matters. Secure chat can be used for non urgent patient care matters.  How to contact the TR[facility] Attending or Consulting provider 7A - 7P or covering provider during after hours 7P -7A, for this patient?  Check the care team in [facility] and look for a) attending/consulting TRH provider listed and b) the TRH team listed. Page or secure chat 7A-7P. Log into [email or web address] and use [place]'s universal password to access. If you do not have the password, please contact the hospital  operator. Locate the [facility] provider you are looking for under [facility] and page to a number that you can be directly reached. If you still have difficulty reaching the  provider, please page the [facility] (Director on Call) for the Hospitalists listed on amion for assistance.

## 2024-04-03 NOTE — Plan of Care (Signed)
  Problem: Fluid Volume: Goal: Ability to maintain a balanced intake and output will improve Outcome: Progressing   Problem: Metabolic: Goal: Ability to maintain appropriate glucose levels will improve Outcome: Progressing   Problem: Nutritional: Goal: Maintenance of adequate nutrition will improve Outcome: Progressing Goal: Progress toward achieving an optimal weight will improve Outcome: Progressing   Problem: Skin Integrity: Goal: Risk for impaired skin integrity will decrease Outcome: Progressing   Problem: Activity: Goal: Risk for activity intolerance will decrease Outcome: Progressing

## 2024-04-03 NOTE — Progress Notes (Signed)
 Mobility Specialist: Progress Note   04/03/24 1600  Mobility  Activity Ambulated with assistance  Level of Assistance Standby assist, set-up cues, supervision of patient - no hands on  Assistive Device Four wheel walker  Distance Ambulated (ft) 40 ft  Activity Response Tolerated well  Mobility Referral Yes  Mobility visit 1 Mobility  Mobility Specialist Start Time (ACUTE ONLY) 1515  Mobility Specialist Stop Time (ACUTE ONLY) 1525  Mobility Specialist Time Calculation (min) (ACUTE ONLY) 10 min    Pt received in bed, agreeable to mobility session. SV throughout. Ambulated right outside her door and took a seated break d/t fatigue. C/o low back pain today ultimately limiting distance. Rolled in the rollator a few more feet away from her door and walked that distance back to her room. Left on EOB with all needs met, call bell in reach.   Ileana Lute Mobility Specialist Please contact via SecureChat or Rehab office at [phone]

## 2024-04-03 NOTE — Plan of Care (Signed)

## 2024-04-03 NOTE — Progress Notes (Signed)
 IVT consult placed for PIV replacement. Patient was assessed 2 days ago by 2 IVT RN's who were unable to locate suitable access; fortunately, IVT was able to place PIV on 9/2 however they noted that this was the only suitable vein found under US .   Notes were written with full assessment findings which have not changed since 9/1 or 9/2 - PIV that was placed has infiltrated likely due to potassium infusion as this is a known irritant to vessels.   As mentioned previously, alternative access needs to be considered at this point as this patient has NO suitable peripheral access options left.   Connie Lasater R Welda Azzarello, RN

## 2024-04-04 DIAGNOSIS — R112 Nausea with vomiting, unspecified: Secondary | ICD-10-CM | POA: Diagnosis not present

## 2024-04-04 LAB — BASIC METABOLIC PANEL WITH GFR
Anion gap: 12 (ref 5–15)
BUN: <5 mg/dL — ABNORMAL LOW (ref 6–20)
CO2: 22 mmol/L (ref 22–32)
Calcium: 8.5 mg/dL — ABNORMAL LOW (ref 8.9–10.3)
Chloride: 105 mmol/L (ref 98–111)
Creatinine, Ser: 1.08 mg/dL — ABNORMAL HIGH (ref 0.44–1.00)
GFR, Estimated: >60 mL/min (ref >60)
Glucose, Bld: 116 mg/dL — ABNORMAL HIGH (ref 70–99)
Potassium: 4.1 mmol/L (ref 3.5–5.1)
Sodium: 139 mmol/L (ref 135–145)

## 2024-04-04 LAB — GLUCOSE, CAPILLARY: Glucose-Capillary: 119 mg/dL — ABNORMAL HIGH (ref 70–99)

## 2024-04-04 MED ORDER — METOCLOPRAMIDE HCL 10 MG PO TABS: 10.0000 mg | ORAL_TABLET | Freq: Three times a day (TID) | ORAL | 0 refills | Status: AC

## 2024-04-04 MED ORDER — ONDANSETRON 4 MG PO TBDP: 4.0000 mg | ORAL_TABLET | Freq: Three times a day (TID) | ORAL | 0 refills | Status: DC | PRN

## 2024-04-04 NOTE — Discharge Summary (Signed)
 Physician Discharge Summary  [name] FMW:[id number] DOB: [birth year] DOA: 03/25/2024  PCP: [name] Madelin Patch, MD  Admit date: 03/25/2024 Discharge date: 04/04/2024 30 Day Unplanned Readmission Risk Score    Flowsheet Row ED to Hosp-Admission (Current) from 03/25/2024 in [place] [facility]  30 Day Unplanned Readmission Risk Score (%) 48.84 Filed at 04/04/2024 0801    This score is the patient's risk of an unplanned readmission within 30 days of being discharged (0 -100%). The score is based on dignosis, age, lab data, medications, orders, and past utilization.   Low:  0-14.9   Medium: 15-21.9   High: 22-29.9   Extreme: 30 and above          Admitted From: Home Disposition: Home  Recommendations for Outpatient Follow-up:  Follow up with PCP in 1-2 weeks Please obtain BMP/CBC in one week Please follow up with your PCP on the following pending results: Unresulted Labs (From admission, onward)     Start     Ordered   04/04/24 0808  Basic metabolic panel  ONCE - URGENT,   URGENT       Question:  Specimen collection method  Answer:  Lab=Lab collect   04/04/24 0807              Home Health: None Equipment/Devices: None  Discharge Condition: Stable CODE STATUS: Full code Diet recommendation:  Diet Order             DIET DYS 3 Room service appropriate? Yes; Fluid consistency: Thin  Diet effective now                   Subjective: Seen and examined, gradual improvement compared to yesterday, only intermittent nausea but no vomiting since about 2 days.  She is okay with going home today.  Brief/Interim Summary: This is a pleasant 53 year old lady with multiple medical problems mainly type 2 diabetes mellitus and presumed gastroparesis presented to ED with nausea and vomiting and poor p.o. intake for last 8 days.  Admitted yet again with diabetic gastroparesis and intractable nausea and vomiting due to diabetic gastroparesis: CT abdomen unremarkable.  Did  not improve as expected on Reglan  for several days, started on erythromycin  2 days ago.  Patient has started to improve since then.  No vomiting since 2 days, has intermittent nausea.  However some of the symptoms are stemming from possible depression as she keeps her room dark and does not walk around.  I encouraged her to keep her room lit during the day and walk couple of times in the hallway, also placed the orders and since then she has improved.  She appears to be stable for discharge at the moment.  I have advised her to continue soft diet and take small portions but more frequent portions and do not overdo.  I have also encouraged her to be very compliant with Reglan  and I am prescribing her Zofran  as needed as well.    Hypokalemia: Resolved.   Type 2 diabetes mellitus: Takes metformin  at home.  Hemoglobin A1c 7.3 a month ago.  Resume home meds.   History of DVT: On Eliquis  PTA, resume home meds.   History of chronic heart failure with preserved ejection fraction and hypertension: On Entresto , BiDil , Aldactone  and Coreg  at home.  Except diuretics, rest of the medications are continued however patient was having low blood pressure so some of the medications were held.  I am resuming all the medications at home.  Hoping  that blood pressure will hold off.   History of SLE and Sjogren syndrome: On immunosuppressants, resume all of them.   Anemia of chronic disease: Hemoglobin stable.   CKD stage IIIa: Stable.   GERD: Continue PPI.   Hyperlipidemia: Continue statin.   Chronic pain: Continue Lyrica .   Acquired hypothyroidism: Continue Synthroid .   Morbid obesity class II: BMI 50.  Weight loss and diet modification counseled.  Discharge plan was discussed with patient and/or family member and they verbalized understanding and agreed with it.  Discharge Diagnoses:  Principal Problem:   Nausea and vomiting Active Problems:   Acute renal failure superimposed on stage 3a chronic kidney  disease (HCC)   SJOGREN'S SYNDROME   Chronic combined systolic and diastolic CHF (congestive heart failure) (HCC)   Generalized abdominal pain   Reflux esophagitis   Diabetic gastroparesis (HCC)   Type 2 diabetes mellitus with hyperlipidemia (HCC)   Obesity hypoventilation syndrome (HCC)   Essential hypertension   Normocytic anemia   History of CVA (cerebrovascular accident)   SLE (systemic lupus erythematosus related syndrome) (HCC)   Nausea & vomiting   Hypothyroidism   Anxiety and depression    Discharge Instructions   Allergies as of 04/04/2024       Reactions   Metoclopramide  Other (See Comments)   Developed restless leg, akathisia type limb movements. Pt is tolerating this medication during 03/2024 admission   Codeine Itching, Rash   Hydrocodone  Rash   Percocet [oxycodone -acetaminophen ] Hives, Rash   Tolerates dilaudid , Tolerates acetaminophen          Medication List     TAKE these medications    Advair HFA 230-21 MCG/ACT inhaler Generic drug: fluticasone -salmeterol Inhale 2 puffs into the lungs in the morning, at noon, and at bedtime.   albuterol  (2.5 MG/3ML) 0.083% nebulizer solution Commonly known as: PROVENTIL  Take 2.5 mg by nebulization every 6 (six) hours as needed for wheezing.   Azathioprine  75 MG Tabs Take 1 tablet by mouth 2 (two) times daily.   Benlysta 200 MG/ML Soaj Generic drug: Belimumab Inject on Thursday.   carvedilol  25 MG tablet Commonly known as: COREG  Take 1 tablet (25 mg total) by mouth 2 (two) times daily.   cycloSPORINE  0.05 % ophthalmic emulsion Commonly known as: RESTASIS  Place 1 drop into both eyes in the morning, at noon, and at bedtime.   diclofenac  Sodium 1 % Gel Commonly known as: VOLTAREN  APPLY 2 GRAMS TO AFFECTED AREA 4 TIMES A DAY What changed: See the new instructions.   Eliquis  2.5 MG Tabs tablet Generic drug: apixaban  Take 1 tablet (2.5 mg total) by mouth 2 (two) times daily.   famotidine  20 MG  tablet Commonly known as: PEPCID  Take 20 mg by mouth 2 (two) times daily.   fluticasone  50 MCG/ACT nasal spray Commonly known as: FLONASE  Place 2 sprays into both nostrils daily as needed for allergies or rhinitis.   HumuLIN  R U-500 KwikPen 500 UNIT/ML KwikPen Generic drug: insulin  regular human CONCENTRATED 100 UNITS, 30 MINUTES BEFORE EACH MEAL What changed: See the new instructions.   hydroxychloroquine  200 MG tablet Commonly known as: PLAQUENIL  Take 200 mg by mouth 2 (two) times daily.   isosorbide -hydrALAZINE  20-37.5 MG tablet Commonly known as: BiDil  Take 1 tablet by mouth 3 (three) times daily.   Jardiance  10 MG Tabs tablet Generic drug: empagliflozin  Take 1 tablet (10 mg total) by mouth daily.   levothyroxine  50 MCG tablet Commonly known as: SYNTHROID  Take 1 tablet (50 mcg total) by mouth daily at 6 (  six) AM.   meclizine  12.5 MG tablet Commonly known as: ANTIVERT  Take 1 tablet (12.5 mg total) by mouth 3 (three) times daily as needed for dizziness.   metFORMIN  500 MG 24 hr tablet Commonly known as: GLUCOPHAGE -XR Take 2 tablets (1,000 mg total) by mouth 2 (two) times daily.   metoCLOPramide  10 MG tablet Commonly known as: REGLAN  Take 1 tablet (10 mg total) by mouth 3 (three) times daily before meals. What changed:  how much to take when to take this reasons to take this   ondansetron  4 MG disintegrating tablet Commonly known as: ZOFRAN -ODT Take 1 tablet (4 mg total) by mouth every 8 (eight) hours as needed.   oxybutynin  5 MG 24 hr tablet Commonly known as: DITROPAN -XL Take 5 mg by mouth daily.   pantoprazole  40 MG tablet Commonly known as: PROTONIX  Take 1 tablet (40 mg total) by mouth daily at 12 noon.   promethazine  25 MG suppository Commonly known as: PHENERGAN  Place 1 suppository (25 mg total) rectally every 6 (six) hours as needed for nausea or vomiting.   promethazine  25 MG tablet Commonly known as: PHENERGAN  Take 1 tablet (25 mg total) by  mouth every 6 (six) hours as needed for nausea or vomiting.   rosuvastatin  20 MG tablet Commonly known as: CRESTOR  Take 1 tablet (20 mg total) by mouth daily.   sacubitril -valsartan  97-103 MG Commonly known as: Entresto  Take 1 tablet by mouth 2 (two) times daily.   spironolactone  25 MG tablet Commonly known as: ALDACTONE  Take 0.5 tablets (12.5 mg total) by mouth daily.   topiramate  25 MG tablet Commonly known as: TOPAMAX  Take 75 mg by mouth at bedtime.   torsemide  20 MG tablet Commonly known as: DEMADEX  Take 1 tablet (20 mg total) by mouth daily as needed (as needed for weight gain of 5-10 lbs, worsening shortness of breath, or worsening lower extremity edema.).   Vitamin D (Ergocalciferol) 1.25 MG (50000 UNIT) Caps capsule Commonly known as: DRISDOL Take 50,000 Units by mouth once a week. Take on Wednesday        Follow-up Information     [name] Madelin Patch, MD Follow up in 1 week(s).   Specialty: Family Medicine Contact information: [address] [facility] [place] [address] [phone]                Allergies  Allergen Reactions   Metoclopramide  Other (See Comments)    Developed restless leg, akathisia type limb movements. Pt is tolerating this medication during 03/2024 admission   Codeine Itching and Rash   Hydrocodone  Rash   Percocet [Oxycodone -Acetaminophen ] Hives and Rash    Tolerates dilaudid , Tolerates acetaminophen      Consultations: None   Procedures/Studies: CT ABDOMEN PELVIS W CONTRAST Result Date: 03/25/2024 CLINICAL DATA:  intractible vomiting. EXAM: CT ABDOMEN AND PELVIS WITH CONTRAST TECHNIQUE: Multidetector CT imaging of the abdomen and pelvis was performed using the standard protocol following bolus administration of intravenous contrast. RADIATION DOSE REDUCTION: This exam was performed according to the departmental dose-optimization program which includes automated exposure control, adjustment of the mA  and/or kV according to patient size and/or use of iterative reconstruction technique. CONTRAST:  OMNIPAQUE  IOHEXOL  350 MG/ML SOLN COMPARISON:  CT scan abdomen and pelvis from 02/23/2024. FINDINGS: Lower chest: There are atelectatic changes at the left lung base. Bilateral visualized lungs are otherwise clear. No overt consolidation. No pleural effusion. The heart is normal in size. No pericardial effusion. Hepatobiliary: The liver is normal in size. Non-cirrhotic  configuration. No suspicious mass. There is an ill-defined hypoattenuating approximately 7 x 9 mm area in the right hepatic lobe, which is too small to adequately characterize. No intrahepatic or extrahepatic bile duct dilation. No calcified gallstones. Normal gallbladder wall thickness. No pericholecystic inflammatory changes. Pancreas: Unremarkable. No pancreatic ductal dilatation or surrounding inflammatory changes. Spleen: Within normal limits. No focal lesion. Adrenals/Urinary Tract: Adrenal glands are unremarkable. No suspicious renal mass. There is a 2 mm nonobstructing calculus in the right kidney upper pole. No other nephroureterolithiasis or obstructive uropathy on either side. Urinary bladder is under distended, precluding optimal assessment. However, no large mass or stones identified. No perivesical fat stranding. Stomach/Bowel: There is a small sliding hiatal hernia. No disproportionate dilation of the small or large bowel loops. No evidence of abnormal bowel wall thickening or inflammatory changes. The appendix is unremarkable. Vascular/Lymphatic: No ascites or pneumoperitoneum. No abdominal or pelvic lymphadenopathy, by size criteria. No aneurysmal dilation of the major abdominal arteries. Reproductive: The uterus is unremarkable. No large adnexal mass. Other: The visualized soft tissues and abdominal wall are unremarkable. Musculoskeletal: No suspicious osseous lesions. There are mild multilevel degenerative changes in the visualized  spine. Since the prior study there is new subtle superior endplate sclerosis/deformity of the L4 vertebral body. However, no associated retropulsion or spinal canal compromise. IMPRESSION: 1. No acute inflammatory process identified within the abdomen or pelvis. No bowel obstruction. 2. There is new subtle superior endplate sclerosis/deformity of the L4 vertebral body. However, no associated retropulsion or spinal canal compromise. 3. Multiple other nonacute observations, as described above. Electronically Signed   By: Ree Molt M.D.   On: 03/25/2024 18:25   DG ABD ACUTE 2+V W 1V CHEST Result Date: 03/22/2024 CLINICAL DATA:  Nausea, vomiting, chest pain and mid abdominal pain EXAM: DG ABDOMEN ACUTE WITH 1 VIEW CHEST COMPARISON:  CT abdomen pelvis 02/23/2024; chest radiograph 02/14/2024 FINDINGS: Low lung volumes and underpenetration limits evaluation. Stable probable cardiomegaly and pulmonary vascular congestion. Question bilateral perihilar infiltrates. No definite pleural effusion or pneumothorax. No evidence of bowel obstruction. IMPRESSION: 1. Low lung volumes. Question bilateral perihilar infiltrates or edema. 2. No evidence of bowel obstruction. Electronically Signed   By: Norman Gatlin M.D.   On: 03/22/2024 03:52     Discharge Exam: Vitals:   04/04/24 0358 04/04/24 0750  BP: 117/64 117/62  Pulse: 82 94  Resp: 16   Temp: 98.5 F (36.9 C)   SpO2: 98% 98%   Vitals:   04/03/24 2033 04/03/24 2345 04/04/24 0358 04/04/24 0750  BP: (!) 107/55 (!) 116/54 117/64 117/62  Pulse: 92 98 82 94  Resp: 18 16 16    Temp: 97.6 F (36.4 C) 98.3 F (36.8 C) 98.5 F (36.9 C)   TempSrc:  Oral Oral   SpO2: 99% 99% 98% 98%  Weight:      Height:        General: Pt is alert, awake, not in acute distress Cardiovascular: RRR, S1/S2 +, no rubs, no gallops Respiratory: CTA bilaterally, no wheezing, no rhonchi Abdominal: Soft, NT, ND, bowel sounds + Extremities: no edema, no cyanosis    The  results of significant diagnostics from this hospitalization (including imaging, microbiology, ancillary and laboratory) are listed below for reference.     Microbiology: No results found for this or any previous visit (from the past 240 hours).   Labs: BNP (last 3 results) Recent Labs    02/09/24 2346 02/14/24 2011 03/22/24 0230  BNP 114.4* 105.4* 63.8   Basic Metabolic  Panel: Recent Labs  Lab 03/29/24 0218 03/30/24 0956 04/02/24 1011  NA 136 136 137  K 3.4* 3.6 3.4*  CL 106 106 106  CO2 20* 22 23  GLUCOSE 133* 145* 126*  BUN 6 8 6   CREATININE 1.27* 1.40* 0.95  CALCIUM  8.2* 8.7* 8.4*  MG  --  1.6*  --    Liver Function Tests: No results for input(s): AST, ALT, ALKPHOS, BILITOT, PROT, ALBUMIN  in the last 168 hours. No results for input(s): LIPASE, AMYLASE in the last 168 hours. No results for input(s): AMMONIA in the last 168 hours. CBC: Recent Labs  Lab 03/30/24 0956 04/02/24 1011  WBC 7.7 8.2  NEUTROABS 4.3 4.7  HGB 9.9* 9.5*  HCT 31.4* 29.8*  MCV 90.2 89.5  PLT 158 136*   Cardiac Enzymes: No results for input(s): CKTOTAL, CKMB, CKMBINDEX, TROPONINI in the last 168 hours. BNP: Invalid input(s): POCBNP CBG: Recent Labs  Lab 04/03/24 0800 04/03/24 1213 04/03/24 1714 04/03/24 2033 04/04/24 0922  GLUCAP 110* 159* 155* 114* 119*   D-Dimer No results for input(s): DDIMER in the last 72 hours. Hgb A1c No results for input(s): HGBA1C in the last 72 hours. Lipid Profile No results for input(s): CHOL, HDL, LDLCALC, TRIG, CHOLHDL, LDLDIRECT in the last 72 hours. Thyroid  function studies No results for input(s): TSH, T4TOTAL, T3FREE, THYROIDAB in the last 72 hours.  Invalid input(s): FREET3 Anemia work up No results for input(s): VITAMINB12, FOLATE, FERRITIN, TIBC, IRON, RETICCTPCT in the last 72 hours. Urinalysis    Component Value Date/Time   COLORURINE AMBER (A) 03/25/2024 2117    APPEARANCEUR HAZY (A) 03/25/2024 2117   LABSPEC >1.046 (H) 03/25/2024 2117   PHURINE 5.0 03/25/2024 2117   GLUCOSEU >=500 (A) 03/25/2024 2117   HGBUR NEGATIVE 03/25/2024 2117   BILIRUBINUR NEGATIVE 03/25/2024 2117   KETONESUR 20 (A) 03/25/2024 2117   PROTEINUR 30 (A) 03/25/2024 2117   UROBILINOGEN 0.2 02/27/2015 1733   NITRITE NEGATIVE 03/25/2024 2117   LEUKOCYTESUR TRACE (A) 03/25/2024 2117   Sepsis Labs Recent Labs  Lab 03/30/24 0956 04/02/24 1011  WBC 7.7 8.2   Microbiology No results found for this or any previous visit (from the past 240 hours).  FURTHER DISCHARGE INSTRUCTIONS:   Get Medicines reviewed and adjusted: Please take all your medications with you for your next visit with your Primary MD   Laboratory/radiological data: Please request your Primary MD to go over all hospital tests and procedure/radiological results at the follow up, please ask your Primary MD to get all Hospital records sent to his/her office.   In some cases, they will be blood work, cultures and biopsy results pending at the time of your discharge. Please request that your primary care M.D. goes through all the records of your hospital data and follows up on these results.   Also Note the following: If you experience worsening of your admission symptoms, develop shortness of breath, life threatening emergency, suicidal or homicidal thoughts you must seek medical attention immediately by calling 911 or calling your MD immediately  if symptoms less severe.   You must read complete instructions/literature along with all the possible adverse reactions/side effects for all the Medicines you take and that have been prescribed to you. Take any new Medicines after you have completely understood and accpet all the possible adverse reactions/side effects.    patient was instructed, not to drive, operate heavy machinery, perform activities at heights, swimming or participation in water  activities or provide  baby-sitting services while on  Pain, Sleep and Anxiety Medications; until their outpatient Physician has advised to do so again. Also recommended to not to take more than prescribed Pain, Sleep and Anxiety Medications.  It is not advisable to combine anxiety, sleep and pain medications without talking with your primary care provider.     Wear Seat belts while driving.   Please note: You were cared for by a hospitalist during your hospital stay. Once you are discharged, your primary care physician will handle any further medical issues. Please note that NO REFILLS for any discharge medications will be authorized once you are discharged, as it is imperative that you return to your primary care physician (or establish a relationship with a primary care physician if you do not have one) for your post hospital discharge needs so that they can reassess your need for medications and monitor your lab values  Time coordinating discharge: Over 30 minutes  SIGNED:   Fredia Skeeter, MD  [facility] 04/04/2024, 10:18 AM *Please note that this is a verbal dictation therefore any spelling or grammatical errors are due to the Dragon Medical One system interpretation. If 7PM-7AM, please contact night-coverage [email or web address]

## 2024-04-04 NOTE — Plan of Care (Signed)

## 2024-04-08 ENCOUNTER — Encounter (HOSPITAL_BASED_OUTPATIENT_CLINIC_OR_DEPARTMENT_OTHER): Attending: General Surgery | Admitting: General Surgery

## 2024-04-08 DIAGNOSIS — N183 Chronic kidney disease, stage 3 unspecified: Secondary | ICD-10-CM | POA: Insufficient documentation

## 2024-04-08 DIAGNOSIS — I130 Hypertensive heart and chronic kidney disease with heart failure and stage 1 through stage 4 chronic kidney disease, or unspecified chronic kidney disease: Secondary | ICD-10-CM | POA: Diagnosis not present

## 2024-04-08 DIAGNOSIS — Z87891 Personal history of nicotine dependence: Secondary | ICD-10-CM | POA: Diagnosis not present

## 2024-04-08 DIAGNOSIS — L89313 Pressure ulcer of right buttock, stage 3: Secondary | ICD-10-CM | POA: Insufficient documentation

## 2024-04-08 DIAGNOSIS — L304 Erythema intertrigo: Secondary | ICD-10-CM | POA: Insufficient documentation

## 2024-04-08 DIAGNOSIS — L89323 Pressure ulcer of left buttock, stage 3: Secondary | ICD-10-CM | POA: Diagnosis present

## 2024-04-08 DIAGNOSIS — Z794 Long term (current) use of insulin: Secondary | ICD-10-CM | POA: Insufficient documentation

## 2024-04-08 DIAGNOSIS — E11622 Type 2 diabetes mellitus with other skin ulcer: Secondary | ICD-10-CM | POA: Insufficient documentation

## 2024-04-08 DIAGNOSIS — I5042 Chronic combined systolic (congestive) and diastolic (congestive) heart failure: Secondary | ICD-10-CM | POA: Insufficient documentation

## 2024-04-08 DIAGNOSIS — Z7984 Long term (current) use of oral hypoglycemic drugs: Secondary | ICD-10-CM | POA: Diagnosis not present

## 2024-04-08 DIAGNOSIS — E1122 Type 2 diabetes mellitus with diabetic chronic kidney disease: Secondary | ICD-10-CM | POA: Diagnosis not present

## 2024-04-12 NOTE — Progress Notes (Signed)
 Care Management UTR Note  Situation: Member enrolled in transitional care navigation services.   Background: High Risk Member  Notice received Patient [name] 04/05/2024 member with inpatient admission at  [facility] 03/25/24-04/04/24 with Intractable Nausea/Vomiting; Gastroparesis.   Assessment: TC PC #1 attempt 04/05/2024 UTR, 2nd attempt 04/08/2024 UTR, #3 attempt 04/09/2024 UTR Mailbox is full, #4 attempt lost contact letter sent via the [facility] patient Portal 04/09/2024, #5 attempt 04/11/2024 Member reported she has to log on for a therapy appointment and requested a call back in an hour . ACN called back UTR member 04/11/2024 , #6 attempt 04/12/2024 UTR.   Recommendation: ACN will close program 04/23/2024 if no response from member   Kate Star RN, BSN, [facility] [place], [place] [address] Office Number [phone]

## 2024-04-14 ENCOUNTER — Encounter (HOSPITAL_COMMUNITY): Payer: Self-pay

## 2024-04-14 ENCOUNTER — Emergency Department (HOSPITAL_COMMUNITY)

## 2024-04-14 ENCOUNTER — Other Ambulatory Visit: Payer: Self-pay

## 2024-04-14 ENCOUNTER — Emergency Department (HOSPITAL_COMMUNITY)
Admission: EM | Admit: 2024-04-14 | Discharge: 2024-04-14 | Disposition: A | Discharge status: Home / Self Care | Attending: Emergency Medicine | Admitting: Emergency Medicine

## 2024-04-14 DIAGNOSIS — R109 Unspecified abdominal pain: Secondary | ICD-10-CM

## 2024-04-14 DIAGNOSIS — Z8673 Personal history of transient ischemic attack (TIA), and cerebral infarction without residual deficits: Secondary | ICD-10-CM | POA: Insufficient documentation

## 2024-04-14 DIAGNOSIS — E039 Hypothyroidism, unspecified: Secondary | ICD-10-CM | POA: Diagnosis not present

## 2024-04-14 DIAGNOSIS — N1831 Chronic kidney disease, stage 3a: Secondary | ICD-10-CM | POA: Diagnosis not present

## 2024-04-14 DIAGNOSIS — E1122 Type 2 diabetes mellitus with diabetic chronic kidney disease: Secondary | ICD-10-CM | POA: Insufficient documentation

## 2024-04-14 DIAGNOSIS — Z79899 Other long term (current) drug therapy: Secondary | ICD-10-CM | POA: Diagnosis not present

## 2024-04-14 DIAGNOSIS — R112 Nausea with vomiting, unspecified: Secondary | ICD-10-CM

## 2024-04-14 DIAGNOSIS — Z794 Long term (current) use of insulin: Secondary | ICD-10-CM | POA: Diagnosis not present

## 2024-04-14 DIAGNOSIS — K3184 Gastroparesis: Secondary | ICD-10-CM | POA: Diagnosis not present

## 2024-04-14 DIAGNOSIS — Z7901 Long term (current) use of anticoagulants: Secondary | ICD-10-CM | POA: Diagnosis not present

## 2024-04-14 DIAGNOSIS — I5042 Chronic combined systolic (congestive) and diastolic (congestive) heart failure: Secondary | ICD-10-CM | POA: Diagnosis not present

## 2024-04-14 DIAGNOSIS — Z87891 Personal history of nicotine dependence: Secondary | ICD-10-CM | POA: Diagnosis not present

## 2024-04-14 DIAGNOSIS — Z7984 Long term (current) use of oral hypoglycemic drugs: Secondary | ICD-10-CM | POA: Diagnosis not present

## 2024-04-14 DIAGNOSIS — I130 Hypertensive heart and chronic kidney disease with heart failure and stage 1 through stage 4 chronic kidney disease, or unspecified chronic kidney disease: Secondary | ICD-10-CM | POA: Insufficient documentation

## 2024-04-14 LAB — RESP PANEL BY RT-PCR (RSV, FLU A&B, COVID)  RVPGX2
Influenza A by PCR: NEGATIVE
Influenza B by PCR: NEGATIVE
Resp Syncytial Virus by PCR: NEGATIVE
SARS Coronavirus 2 by RT PCR: NEGATIVE

## 2024-04-14 LAB — BASIC METABOLIC PANEL WITH GFR
Anion gap: 19 — ABNORMAL HIGH (ref 5–15)
BUN: 6 mg/dL (ref 6–20)
CO2: 20 mmol/L — ABNORMAL LOW (ref 22–32)
Calcium: 9.1 mg/dL (ref 8.9–10.3)
Chloride: 103 mmol/L (ref 98–111)
Creatinine, Ser: 1.05 mg/dL — ABNORMAL HIGH (ref 0.44–1.00)
GFR, Estimated: >60 mL/min (ref >60)
Glucose, Bld: 114 mg/dL — ABNORMAL HIGH (ref 70–99)
Potassium: 3.6 mmol/L (ref 3.5–5.1)
Sodium: 142 mmol/L (ref 135–145)

## 2024-04-14 LAB — HEPATIC FUNCTION PANEL
ALT: 10 U/L (ref 0–44)
AST: 17 U/L (ref 15–41)
Albumin: 2.6 g/dL — ABNORMAL LOW (ref 3.5–5.0)
Alkaline Phosphatase: 58 U/L (ref 38–126)
Bilirubin, Direct: 0.3 mg/dL — ABNORMAL HIGH (ref 0.0–0.2)
Indirect Bilirubin: 1.7 mg/dL — ABNORMAL HIGH (ref 0.3–0.9)
Total Bilirubin: 2 mg/dL — ABNORMAL HIGH (ref 0.0–1.2)
Total Protein: 5.6 g/dL — ABNORMAL LOW (ref 6.5–8.1)

## 2024-04-14 LAB — CBC
HCT: 36.4 % (ref 36.0–46.0)
Hemoglobin: 11.7 g/dL — ABNORMAL LOW (ref 12.0–15.0)
MCH: 28.5 pg (ref 26.0–34.0)
MCHC: 32.1 g/dL (ref 30.0–36.0)
MCV: 88.8 fL (ref 80.0–100.0)
Platelets: 240 K/uL (ref 150–400)
RBC: 4.1 MIL/uL (ref 3.87–5.11)
RDW: 14.6 % (ref 11.5–15.5)
WBC: 12.6 K/uL — ABNORMAL HIGH (ref 4.0–10.5)
nRBC: 0 % (ref 0.0–0.2)

## 2024-04-14 LAB — TROPONIN I (HIGH SENSITIVITY)
Troponin I (High Sensitivity): 9 ng/L (ref <18)
Troponin I (High Sensitivity): 8 ng/L (ref <18)

## 2024-04-14 LAB — BRAIN NATRIURETIC PEPTIDE: B Natriuretic Peptide: 558.9 pg/mL — ABNORMAL HIGH (ref 0.0–100.0)

## 2024-04-14 MED ORDER — DROPERIDOL 2.5 MG/ML IJ SOLN
2.5000 mg | Freq: Once | INTRAMUSCULAR | Status: AC
  Administered 2024-04-14: 2.5 mg via INTRAVENOUS
  Filled 2024-04-14: qty 2

## 2024-04-14 MED ORDER — HYDROMORPHONE HCL 1 MG/ML IJ SOLN
1.0000 mg | Freq: Once | INTRAMUSCULAR | Status: AC
  Administered 2024-04-14: 1 mg via INTRAVENOUS
  Filled 2024-04-14: qty 1

## 2024-04-14 MED ORDER — IOHEXOL 350 MG/ML SOLN
100.0000 mL | Freq: Once | INTRAVENOUS | Status: AC | PRN
  Administered 2024-04-14: 100 mL via INTRAVENOUS

## 2024-04-14 MED ORDER — SODIUM CHLORIDE 0.9 % IV BOLUS
1000.0000 mL | Freq: Once | INTRAVENOUS | Status: AC
  Administered 2024-04-14: 1000 mL via INTRAVENOUS

## 2024-04-14 MED ORDER — ONDANSETRON HCL 4 MG PO TABS: 4.0000 mg | ORAL_TABLET | ORAL | 0 refills | Status: AC | PRN

## 2024-04-14 MED ORDER — PROCHLORPERAZINE EDISYLATE 10 MG/2ML IJ SOLN
5.0000 mg | Freq: Once | INTRAMUSCULAR | Status: AC
  Administered 2024-04-14: 5 mg via INTRAVENOUS
  Filled 2024-04-14: qty 2

## 2024-04-14 MED ORDER — SUCRALFATE 1 G PO TABS: 1.0000 g | ORAL_TABLET | Freq: Three times a day (TID) | ORAL | 0 refills | Status: AC

## 2024-04-14 MED ORDER — DIPHENHYDRAMINE HCL 50 MG/ML IJ SOLN
25.0000 mg | Freq: Once | INTRAMUSCULAR | Status: AC
  Administered 2024-04-14: 25 mg via INTRAVENOUS
  Filled 2024-04-14: qty 1

## 2024-04-14 MED ORDER — PANTOPRAZOLE SODIUM 40 MG PO TBEC: 40.0000 mg | DELAYED_RELEASE_TABLET | Freq: Every day | ORAL | 0 refills | Status: AC

## 2024-04-14 MED ORDER — DICYCLOMINE HCL 20 MG PO TABS: 20.0000 mg | ORAL_TABLET | Freq: Two times a day (BID) | ORAL | 0 refills | Status: AC

## 2024-04-14 MED ORDER — ONDANSETRON HCL 4 MG/2ML IJ SOLN
4.0000 mg | Freq: Once | INTRAMUSCULAR | Status: AC
Start: 2024-04-14 — End: 2024-04-14
  Administered 2024-04-14: 4 mg via INTRAVENOUS
  Filled 2024-04-14: qty 2

## 2024-04-14 MED ORDER — HYDROMORPHONE HCL 1 MG/ML IJ SOLN: 1.0000 mg | Freq: Once | INTRAMUSCULAR | Status: DC

## 2024-04-14 MED ORDER — FUROSEMIDE 10 MG/ML IJ SOLN
40.0000 mg | Freq: Once | INTRAMUSCULAR | Status: AC
  Administered 2024-04-14: 40 mg via INTRAVENOUS
  Filled 2024-04-14: qty 4

## 2024-04-14 MED ORDER — FAMOTIDINE IN NACL 20-0.9 MG/50ML-% IV SOLN
20.0000 mg | Freq: Once | INTRAVENOUS | Status: AC
  Administered 2024-04-14: 20 mg via INTRAVENOUS
  Filled 2024-04-14: qty 50

## 2024-04-14 NOTE — ED Notes (Signed)
Pt given lemon-lime soda

## 2024-04-14 NOTE — Discharge Instructions (Addendum)
 It was a pleasure caring for you today in the emergency department.  Be sure to drink plenty of fluids and get plenty of rest, eat a very bland diet. Please follow up with gastroenterology. Closely monitor your blood sugar.   Please return to the emergency department for any worsening or worrisome symptoms.

## 2024-04-14 NOTE — ED Triage Notes (Addendum)
 Pt arrives via EMS from home. PT c/o chest pain, abdominal pain, nausea, and vomiting for the past couple of weeks. PT AxOx4. EMS administered 4mg  of zofran  pta.

## 2024-04-14 NOTE — ED Notes (Signed)
 Pt made aware discharge. Pt planning to call her ride

## 2024-04-14 NOTE — ED Provider Notes (Signed)
 [facility] Provider Note  CSN: [id number] Arrival date & time: 04/14/24 1308  Chief Complaint(s) Chest Pain and Vomiting  HPI [name] is a 53 y.o. female with past medical history as below, significant for intractable nausea and vomiting, CHF, gastroparesis, hypertension, obesity, lupus who presents to the ED with complaint of pain nausea vomiting  Patient reports has been having nausea and vomiting abdominal pain for the past 10 days since she was discharged.  She was told that she had gastroparesis but she does not think it is a gastroparesis provoking her symptoms since her dc.  No fevers or chills.  No suspicious p.o. intake.  No change in bowel or bladder function.  She has missed her Lasix  over the past few days.  No acute hypoxia or dyspnea.  No recent travel or sick contacts, denies THC.  Past Medical History Past Medical History:  Diagnosis Date   Acute renal failure (HCC)     Intractable nausea vomiting secondary to diabetic gastroparesis causing dehydration and acute renal failure /notes 04/01/2013   Anginal pain (HCC)    Anxiety    Bell's palsy 08/09/2010   Bicornuate uterus    CAP (community acquired pneumonia) 10/2011   thelbert 11/08/2011   Chest pain 01/13/2014   CHF (congestive heart failure) (HCC)    Diabetic gastroparesis associated with type 2 diabetes mellitus (HCC)    this is presumed diagnoses, not confirmed by any studies.    Eczema    Family history of breast cancer    Family history of malignant neoplasm of breast    Family history of malignant neoplasm of ovary    Family history of ovarian cancer    Family history of prostate cancer    GERD (gastroesophageal reflux disease)    High cholesterol    Hypertension    Liver lesion 08/13/2019   Liver mass 2014   biopsied 03/2013 at [facility], not malignant.  is to undergo radiologic ablation of the mass in sept/October 2014.    Lupus    Migraines     maybe a couple times/yr (04/01/2013)   Obesity    Obstructive sleep apnea on CPAP 2011   Oxygen at nights   Pulmonary embolism (HCC) 05/21/2021   SJOGREN'S SYNDROME 08/09/2010   SLE (systemic lupus erythematosus) (HCC)    thelbert 12/01/2010   Patient Active Problem List   Diagnosis Date Noted   Nausea and vomiting 03/26/2024   Non-ST elevation (NSTEMI) myocardial infarction (HCC) 02/15/2024   Acute HFrEF (heart failure with reduced ejection fraction) (HCC) 02/15/2024   Acute encephalopathy 02/14/2024   SIRS (systemic inflammatory response syndrome) (HCC) 02/14/2024   Elevated troponin 02/14/2024   Malnutrition of moderate degree 04/27/2023   Anxiety and depression 04/25/2023   Ileus (HCC) 11/16/2022   Hypokalemia 11/16/2022   Hypomagnesemia 11/16/2022   Hypothyroidism 11/16/2022   Oral candidiasis 11/16/2022   History of venous thromboembolism 11/16/2022   Severe sepsis (HCC) 11/09/2022   Acute metabolic encephalopathy 10/24/2022   Intra-abdominal abscess (HCC) 10/24/2022   Nausea & vomiting 05/09/2022   Vitamin B12 deficiency 01/04/2022   History of pulmonary embolism 12/22/2021   Normocytic anemia 12/22/2021   Foot pain, bilateral 11/22/2021   Genetic testing 08/17/2021   Family history of breast cancer 07/19/2021   Family history of prostate cancer 07/19/2021   Esophageal dysmotility 06/03/2021   Physical debility 08/29/2019   Obesity hypoventilation syndrome (HCC) 08/23/2019   Skin breakdown 08/23/2019   Liver hemorrhage  08/23/2019   History of CVA (cerebrovascular accident) 08/19/2019   Mood disorder (HCC) 08/19/2019   Morbid obesity with BMI of 50.0-59.9, adult (HCC) 08/19/2019   SLE (systemic lupus erythematosus related syndrome) (HCC) 08/19/2019   Hepatic adenoma    Sciatica 04/20/2018   Type 2 diabetes mellitus with hyperlipidemia (HCC) 09/19/2015   Radiculopathy of cervical spine    Family history of malignant neoplasm of breast    Family history of malignant  neoplasm of ovary    Intractable nausea and vomiting 11/11/2013   Septate uterus 09/08/2013   Diabetic gastroparesis (HCC) 04/28/2013   Constipation due to pain medication 04/21/2013   Odynophagia 04/04/2013   Acute renal failure superimposed on stage 3a chronic kidney disease (HCC) 04/01/2013   Liver masses 02/28/2013   Generalized abdominal pain 02/26/2013   Reflux esophagitis 02/26/2013   Morbid obesity (HCC) 10/10/2012   Chronic heart failure with preserved ejection fraction (HFpEF) (HCC) 10/09/2012   Clinical polymyositis syndrome (HCC) 09/06/2012   Mononeuritis of lower limb 01/12/2012   Chronic combined systolic and diastolic CHF (congestive heart failure) (HCC) 11/08/2011   Essential hypertension 11/08/2011   HLD (hyperlipidemia) 07/19/2011   Bell's palsy 08/09/2010   SJOGREN'S SYNDROME 08/09/2010   Home Medication(s) Prior to Admission medications   Medication Sig Start Date End Date Taking? Authorizing Provider  dicyclomine  (BENTYL ) 20 MG tablet Take 1 tablet (20 mg total) by mouth 2 (two) times daily. 04/14/24  Yes Elnor Savant A, DO  ondansetron  (ZOFRAN ) 4 MG tablet Take 1 tablet (4 mg total) by mouth every 4 (four) hours as needed for nausea or vomiting. 04/14/24  Yes Elnor Savant LABOR, DO  pantoprazole  (PROTONIX ) 40 MG tablet Take 1 tablet (40 mg total) by mouth daily for 14 days. 04/14/24 04/28/24 Yes Elnor Savant A, DO  sucralfate  (CARAFATE ) 1 g tablet Take 1 tablet (1 g total) by mouth with breakfast, with lunch, and with evening meal for 7 days. 04/14/24 04/21/24 Yes Elnor Savant LABOR, DO  ADVAIR HFA 230-21 MCG/ACT inhaler Inhale 2 puffs into the lungs in the morning, at noon, and at bedtime. 12/20/20   [provider]  albuterol  (PROVENTIL ) (2.5 MG/3ML) 0.083% nebulizer solution Take 2.5 mg by nebulization every 6 (six) hours as needed for wheezing.    [provider]  apixaban  (ELIQUIS ) 2.5 MG TABS tablet Take 1 tablet (2.5 mg total) by mouth 2 (two) times daily.  02/29/24   Drusilla Sabas RAMAN, MD  Azathioprine  75 MG TABS Take 1 tablet by mouth 2 (two) times daily. 01/19/23   [provider]  BENLYSTA 200 MG/ML SOAJ Inject on Thursday. 01/31/24 01/30/25  [provider]  carvedilol  (COREG ) 25 MG tablet Take 1 tablet (25 mg total) by mouth 2 (two) times daily. 03/15/24 06/13/24  Ladona Heinz, MD  cycloSPORINE  (RESTASIS ) 0.05 % ophthalmic emulsion Place 1 drop into both eyes in the morning, at noon, and at bedtime.     [provider]  diclofenac  Sodium (VOLTAREN ) 1 % GEL APPLY 2 GRAMS TO AFFECTED AREA 4 TIMES A DAY Patient taking differently: Apply 1 Application topically daily as needed (pain). 01/11/22   Cantwell, Celeste C, PA-C  empagliflozin  (JARDIANCE ) 10 MG TABS tablet Take 1 tablet (10 mg total) by mouth daily. 03/01/24   Drusilla Sabas RAMAN, MD  famotidine  (PEPCID ) 20 MG tablet Take 20 mg by mouth 2 (two) times daily.    [provider]  fluticasone  (FLONASE ) 50 MCG/ACT nasal spray Place 2 sprays into both nostrils daily as  needed for allergies or rhinitis.    [provider]  hydroxychloroquine  (PLAQUENIL ) 200 MG tablet Take 200 mg by mouth 2 (two) times daily.    [provider]  insulin  regular human CONCENTRATED (HUMULIN  R U-500 KWIKPEN) 500 UNIT/ML KwikPen 100 UNITS, 30 MINUTES BEFORE EACH MEAL Patient taking differently: Inject 100 Units into the skin 3 (three) times daily with meals. 100 Units, 30 minutes before each meal 11/17/22   [place]on [place], MD  isosorbide -hydrALAZINE  (BIDIL ) 20-37.5 MG tablet Take 1 tablet by mouth 3 (three) times daily. 03/18/24   Ladona Heinz, MD  levothyroxine  (SYNTHROID ) 50 MCG tablet Take 1 tablet (50 mcg total) by mouth daily at 6 (six) AM. 08/30/19   Sherrill Cable Latif, DO  meclizine  (ANTIVERT ) 12.5 MG tablet Take 1 tablet (12.5 mg total) by mouth 3 (three) times daily as needed for dizziness. 05/03/23   Regalado, Belkys A, MD  metFORMIN  (GLUCOPHAGE -XR) 500 MG 24 hr tablet Take 2 tablets  (1,000 mg total) by mouth 2 (two) times daily. 02/02/21   Kassie Mallick, MD  metoCLOPramide  (REGLAN ) 10 MG tablet Take 1 tablet (10 mg total) by mouth 3 (three) times daily before meals. 04/04/24   Vernon Ranks, MD  oxybutynin  (DITROPAN -XL) 5 MG 24 hr tablet Take 5 mg by mouth daily.    [provider]  promethazine  (PHENERGAN ) 25 MG suppository Place 1 suppository (25 mg total) rectally every 6 (six) hours as needed for nausea or vomiting. 03/22/24   Jarold Olam HERO, PA-C  rosuvastatin  (CRESTOR ) 20 MG tablet Take 1 tablet (20 mg total) by mouth daily. 02/29/24 05/29/24  Drusilla Sabas RAMAN, MD  sacubitril -valsartan  (ENTRESTO ) 97-103 MG Take 1 tablet by mouth 2 (two) times daily. 03/15/24   Ladona Heinz, MD  spironolactone  (ALDACTONE ) 25 MG tablet Take 0.5 tablets (12.5 mg total) by mouth daily. 03/01/24   Drusilla Sabas RAMAN, MD  topiramate  (TOPAMAX ) 25 MG tablet Take 75 mg by mouth at bedtime. 03/17/16   [provider]  torsemide  (DEMADEX ) 20 MG tablet Take 1 tablet (20 mg total) by mouth daily as needed (as needed for weight gain of 5-10 lbs, worsening shortness of breath, or worsening lower extremity edema.). 02/29/24   Drusilla Sabas RAMAN, MD  Vitamin D, Ergocalciferol, (DRISDOL) 1.25 MG (50000 UNIT) CAPS capsule Take 50,000 Units by mouth once a week. Take on Wednesday 09/20/22   [provider]                                                                                                                                    Past Surgical History Past Surgical History:  Procedure Laterality Date   BREAST CYST EXCISION Left 08/2005   epidermoid   CARDIAC CATHETERIZATION  02/07/2012   ECTOPIC PREGNANCY SURGERY  1999   ESOPHAGOGASTRODUODENOSCOPY N/A 02/26/2013   Procedure: ESOPHAGOGASTRODUODENOSCOPY (EGD);  Surgeon: Norleen LOISE Kiang, MD;  Location: [facility] ENDOSCOPY;  Service: Endoscopy;  Laterality: N/A;  ESOPHAGOGASTRODUODENOSCOPY N/A 03/02/2015   Procedure: ESOPHAGOGASTRODUODENOSCOPY (EGD);  Surgeon:  Toribio SHAUNNA Cedar, MD;  Location: [facility] ENDOSCOPY;  Service: Endoscopy;  Laterality: N/A;   ESOPHAGOGASTRODUODENOSCOPY N/A 02/23/2021   Procedure: ESOPHAGOGASTRODUODENOSCOPY (EGD);  Surgeon: Dianna Specking, MD;  Location: [facility] ENDOSCOPY;  Service: Endoscopy;  Laterality: N/A;  possible dilation   HERNIA REPAIR     LEFT AND RIGHT HEART CATHETERIZATION WITH CORONARY ANGIOGRAM N/A 02/07/2012   Procedure: LEFT AND RIGHT HEART CATHETERIZATION WITH CORONARY ANGIOGRAM;  Surgeon: Erick JONELLE Bergamo, MD;  Location: [facility] CATH LAB;  Service: Cardiovascular;  Laterality: N/A;   LIVER BIOPSY  03/2013   liver mass/medical hx noted above   MUSCLE BIOPSY     for lupus/notes 12/01/2010   UMBILICAL HERNIA REPAIR  1980's   Family History Family History  Problem Relation Age of Onset   Diabetes Mother    Asthma Mother    Urticaria Mother    Hypertension Mother    Stroke Mother    Ovarian cancer Sister 85       maternal half-sister; RAD51C positive   Asthma Brother    Asthma Brother    Breast cancer Maternal Aunt 60       currently 47   Cancer Maternal Aunt        unk. primary   Cancer Maternal Aunt        unk. primary   Ovarian cancer Maternal Aunt        deceased 50s   Breast cancer Maternal Aunt        deceased 86s   Breast cancer Maternal Aunt    Stomach cancer Maternal Uncle        dx 63s; deceased   Prostate cancer Maternal Uncle        deceased 74s   Breast cancer Cousin        deceased 29s; daughter of mat uncle with stomach ca   Hypertension Other    Stroke Other    Arthritis Other    Allergic rhinitis Neg Hx    Angioedema Neg Hx     Social History Social History   Tobacco Use   Smoking status: Former    Current packs/day: 0.00    Average packs/day: 0.5 packs/day for 3.0 years (1.5 ttl pk-yrs)    Types: Cigarettes    Start date: 06/01/1988    Quit date: 06/02/1991    Years since quitting: 32.9   Smokeless tobacco: Never   Tobacco comments:    quit 28 yrs ago  Vaping Use    Vaping status: Never Used  Substance Use Topics   Alcohol  use: No    Alcohol /week: 0.0 standard drinks of alcohol    Drug use: No   Allergies Metoclopramide , Codeine, Hydrocodone , and Percocet [oxycodone -acetaminophen ]  Review of Systems A thorough review of systems was obtained and all systems are negative except as noted in the HPI and PMH.   Physical Exam Vital Signs  I have reviewed the triage vital signs BP (!) 97/49   Pulse 94   Temp 98.4 F (36.9 C) (Oral)   Resp 16   LMP 03/24/2017   SpO2 98%  Physical Exam Vitals and nursing note reviewed.  Constitutional:      General: She is not in acute distress.    Appearance: Normal appearance. She is well-developed. She is not ill-appearing.  HENT:     Head: Normocephalic and atraumatic.     Right Ear: External ear normal.     Left Ear: External ear normal.  Nose: Nose normal.     Mouth/Throat:     Mouth: Mucous membranes are moist.  Eyes:     General: No scleral icterus.       Right eye: No discharge.        Left eye: No discharge.  Cardiovascular:     Rate and Rhythm: Normal rate.  Pulmonary:     Effort: Pulmonary effort is normal. No respiratory distress.     Breath sounds: No stridor.  Abdominal:     General: Abdomen is flat. There is no distension.     Palpations: Abdomen is soft.     Tenderness: There is abdominal tenderness. There is no guarding.   Musculoskeletal:        General: No deformity.     Cervical back: No rigidity.  Skin:    General: Skin is warm and dry.     Coloration: Skin is not cyanotic, jaundiced or pale.  Neurological:     Mental Status: She is alert.  Psychiatric:        Speech: Speech normal.        Behavior: Behavior normal. Behavior is cooperative.     ED Results and Treatments Labs (all labs ordered are listed, but only abnormal results are displayed) Labs Reviewed  BASIC METABOLIC PANEL WITH GFR - Abnormal; Notable for the following components:      Result Value   CO2  20 (*)    Glucose, Bld 114 (*)    Creatinine, Ser 1.05 (*)    Anion gap 19 (*)    All other components within normal limits  CBC - Abnormal; Notable for the following components:   WBC 12.6 (*)    Hemoglobin 11.7 (*)    All other components within normal limits  HEPATIC FUNCTION PANEL - Abnormal; Notable for the following components:   Total Protein 5.6 (*)    Albumin  2.6 (*)    Total Bilirubin 2.0 (*)    Bilirubin, Direct 0.3 (*)    Indirect Bilirubin 1.7 (*)    All other components within normal limits  BRAIN NATRIURETIC PEPTIDE - Abnormal; Notable for the following components:   B Natriuretic Peptide 558.9 (*)    All other components within normal limits  RESP PANEL BY RT-PCR (RSV, FLU A&B, COVID)  RVPGX2  TROPONIN I (HIGH SENSITIVITY)  TROPONIN I (HIGH SENSITIVITY)                                                                                                                          Radiology No results found.   Pertinent labs & imaging results that were available during my care of the patient were reviewed by me and considered in my medical decision making (see MDM for details).  Medications Ordered in ED Medications  sodium chloride  0.9 % bolus 1,000 mL (0 mLs Intravenous Stopped 04/14/24 1902)  droperidol  (INAPSINE ) 2.5 MG/ML injection 2.5 mg (2.5 mg Intravenous Given 04/14/24 1650)  diphenhydrAMINE  (BENADRYL ) injection 25 mg (  25 mg Intravenous Given 04/14/24 1650)  iohexol  (OMNIPAQUE ) 350 MG/ML injection 100 mL (100 mLs Intravenous Contrast Given 04/14/24 1729)  famotidine  (PEPCID ) IVPB 20 mg premix (0 mg Intravenous Stopped 04/14/24 1902)  HYDROmorphone  (DILAUDID ) injection 1 mg (1 mg Intravenous Given 04/14/24 1804)  furosemide  (LASIX ) injection 40 mg (40 mg Intravenous Given 04/14/24 1936)  ondansetron  (ZOFRAN ) injection 4 mg (4 mg Intravenous Given 04/14/24 1936)  prochlorperazine  (COMPAZINE ) injection 5 mg (5 mg Intravenous Given 04/14/24 1936)                                                                                                                                      Procedures Procedures  (including critical care time)  Medical Decision Making / ED Course    Medical Decision Making:    CHLOE MIYOSHI is a 53 y.o. female with past medical history as below, significant for intractable nausea and vomiting, CHF, gastroparesis, hypertension, obesity, lupus who presents to the ED with complaint of pain nausea vomiting. The complaint involves an extensive differential diagnosis and also carries with it a high risk of complications and morbidity.  Serious etiology was considered. Ddx includes but is not limited to: Differential includes all life-threatening causes for chest pain. This includes but is not exclusive to acute coronary syndrome, aortic dissection, pulmonary embolism, cardiac tamponade, community-acquired pneumonia, pericarditis, musculoskeletal chest wall pain, etc. Differential diagnosis includes but is not exclusive to acute cholecystitis, intrathoracic causes for epigastric abdominal pain, gastritis, duodenitis, pancreatitis, small bowel or large bowel obstruction, abdominal aortic aneurysm, hernia, gastritis, etc.   Complete initial physical exam performed, notably the patient was in NAD, asking for analgesia.    Reviewed and confirmed nursing documentation for past medical history, family history, social history.  Vital signs reviewed.    Gastroparesis> - known hx, likely 2/2 DM - feeling better after anti emetic  - tolerating PO - feeling back to baseline, will adjust anti emetic for home, bland diet recommended, f/u gi  Cp/dib > - pt missed mx doses lasix  - +BNP but does not appear to be acutely volume overloaded, not hypoxic - given lasix  here w/ good UOP, encourage adherence to medications and fluid restrictions/salt restriction - f/u pcp and primary cardiology   Clinical Course as of 04/18/24 1508  Sun Apr 14, 2024  1847 CT  w/ ?esophagitis  [SG]  1903 Feeling better [SG]  2108 B Natriuretic Peptide(!): 558.9 Pt reports has not taken her lasix  in a couple days, no hypoxia, does not appear to be overtly volume overloaded, no hypoxia. Good UOP w/ lasix   [SG]    Clinical Course User Index [SG] Elnor Savant A, DO     Patient in no distress and overall condition is stable. Detailed discussions were had with the patient/guardian regarding current findings, and need for close f/u with PCP or on call doctor. The patient/guardian has been instructed to return immediately if the  symptoms worsen in any way for re-evaluation. Patient/guardian verbalized understanding and is in agreement with current care plan. All questions answered prior to discharge.                Additional history obtained: -Additional history obtained from na -External records from outside source obtained and reviewed including: Chart review including previous notes, labs, imaging, consultation notes including  Prior admit Home meds   Lab Tests: -I ordered, reviewed, and interpreted labs.   The pertinent results include:   Labs Reviewed  BASIC METABOLIC PANEL WITH GFR - Abnormal; Notable for the following components:      Result Value   CO2 20 (*)    Glucose, Bld 114 (*)    Creatinine, Ser 1.05 (*)    Anion gap 19 (*)    All other components within normal limits  CBC - Abnormal; Notable for the following components:   WBC 12.6 (*)    Hemoglobin 11.7 (*)    All other components within normal limits  HEPATIC FUNCTION PANEL - Abnormal; Notable for the following components:   Total Protein 5.6 (*)    Albumin  2.6 (*)    Total Bilirubin 2.0 (*)    Bilirubin, Direct 0.3 (*)    Indirect Bilirubin 1.7 (*)    All other components within normal limits  BRAIN NATRIURETIC PEPTIDE - Abnormal; Notable for the following components:   B Natriuretic Peptide 558.9 (*)    All other components within normal limits  RESP PANEL BY RT-PCR  (RSV, FLU A&B, COVID)  RVPGX2  TROPONIN I (HIGH SENSITIVITY)  TROPONIN I (HIGH SENSITIVITY)    Notable for as above  EKG   EKG Interpretation Date/Time:  Sunday April 14 2024 13:18:54 EDT Ventricular Rate:  104 PR Interval:  126 QRS Duration:  84 QT Interval:  330 QTC Calculation: 433 R Axis:   2  Text Interpretation: Sinus tachycardia with Premature atrial complexes Low voltage QRS ST & T wave abnormality, consider inferior ischemia Abnormal ECG When compared with ECG of 25-Mar-2024 15:31, PREVIOUS ECG IS PRESENT Confirmed by Elnor Savant ([id number]) on 04/14/2024 4:03:11 PM         Imaging Studies ordered: I ordered imaging studies including ctap, cxr I independently visualized the following imaging with scope of interpretation limited to determining acute life threatening conditions related to emergency care; findings noted above I agree with the radiologist interpretation If any imaging was obtained with contrast I closely monitored patient for any possible adverse reaction a/w contrast administration in the emergency department   Medicines ordered and prescription drug management: Meds ordered this encounter  Medications   sodium chloride  0.9 % bolus 1,000 mL   DISCONTD: HYDROmorphone  (DILAUDID ) injection 1 mg   droperidol  (INAPSINE ) 2.5 MG/ML injection 2.5 mg   diphenhydrAMINE  (BENADRYL ) injection 25 mg   iohexol  (OMNIPAQUE ) 350 MG/ML injection 100 mL   famotidine  (PEPCID ) IVPB 20 mg premix   HYDROmorphone  (DILAUDID ) injection 1 mg   furosemide  (LASIX ) injection 40 mg   ondansetron  (ZOFRAN ) injection 4 mg   prochlorperazine  (COMPAZINE ) injection 5 mg   pantoprazole  (PROTONIX ) 40 MG tablet    Sig: Take 1 tablet (40 mg total) by mouth daily for 14 days.    Dispense:  14 tablet    Refill:  0   sucralfate  (CARAFATE ) 1 g tablet    Sig: Take 1 tablet (1 g total) by mouth with breakfast, with lunch, and with evening meal for 7 days.    Dispense:  21 tablet  Refill:  0    ondansetron  (ZOFRAN ) 4 MG tablet    Sig: Take 1 tablet (4 mg total) by mouth every 4 (four) hours as needed for nausea or vomiting.    Dispense:  12 tablet    Refill:  0   dicyclomine  (BENTYL ) 20 MG tablet    Sig: Take 1 tablet (20 mg total) by mouth 2 (two) times daily.    Dispense:  20 tablet    Refill:  0    -I have reviewed the patients home medicines and have made adjustments as needed   Consultations Obtained: na   Cardiac Monitoring: The patient was maintained on a cardiac monitor.  I personally viewed and interpreted the cardiac monitored which showed an underlying rhythm of: nsr Continuous pulse oximetry interpreted by myself, 99% on RA.    Social Determinants of Health:  Diagnosis or treatment significantly limited by social determinants of health: former smoker and obesity   Reevaluation: After the interventions noted above, I reevaluated the patient and found that they have improved  Co morbidities that complicate the patient evaluation  Past Medical History:  Diagnosis Date   Acute renal failure (HCC)     Intractable nausea vomiting secondary to diabetic gastroparesis causing dehydration and acute renal failure /notes 04/01/2013   Anginal pain (HCC)    Anxiety    Bell's palsy 08/09/2010   Bicornuate uterus    CAP (community acquired pneumonia) 10/2011   thelbert 11/08/2011   Chest pain 01/13/2014   CHF (congestive heart failure) (HCC)    Diabetic gastroparesis associated with type 2 diabetes mellitus (HCC)    this is presumed diagnoses, not confirmed by any studies.    Eczema    Family history of breast cancer    Family history of malignant neoplasm of breast    Family history of malignant neoplasm of ovary    Family history of ovarian cancer    Family history of prostate cancer    GERD (gastroesophageal reflux disease)    High cholesterol    Hypertension    Liver lesion 08/13/2019   Liver mass 2014   biopsied 03/2013 at [facility], not malignant.   is to undergo radiologic ablation of the mass in sept/October 2014.    Lupus    Migraines    maybe a couple times/yr (04/01/2013)   Obesity    Obstructive sleep apnea on CPAP 2011   Oxygen at nights   Pulmonary embolism (HCC) 05/21/2021   SJOGREN'S SYNDROME 08/09/2010   SLE (systemic lupus erythematosus) (HCC)    thelbert 12/01/2010      Dispostion: Disposition decision including need for hospitalization was considered, and patient discharged from emergency department.    Final Clinical Impression(s) / ED Diagnoses Final diagnoses:  Nausea and vomiting, unspecified vomiting type  Abdominal pain, unspecified abdominal location  Gastroparesis        Elnor Jayson LABOR, DO 04/18/24 1508

## 2024-04-14 NOTE — ED Notes (Signed)
 Pt given ginger ale and crackers by Dr. Elnor

## 2024-05-10 ENCOUNTER — Ambulatory Visit: Attending: Cardiology | Admitting: Cardiology

## 2024-06-01 DEATH — deceased

## 2024-06-17 ENCOUNTER — Other Ambulatory Visit: Payer: Self-pay | Admitting: Cardiology

## 2024-06-17 DIAGNOSIS — I5032 Chronic diastolic (congestive) heart failure: Secondary | ICD-10-CM

## 2024-06-17 DIAGNOSIS — I10 Essential (primary) hypertension: Secondary | ICD-10-CM
